# Patient Record
Sex: Female | Born: 1945 | Race: White | Hispanic: No | Marital: Married | Smoking: Never smoker
Health system: Western US, Academic
[De-identification: ages and names within clinical notes are randomized; demographics above are authoritative.]

## PROBLEM LIST (undated history)

## (undated) DIAGNOSIS — I272 Pulmonary hypertension, unspecified: Secondary | ICD-10-CM

## (undated) DIAGNOSIS — E1140 Type 2 diabetes mellitus with diabetic neuropathy, unspecified: Secondary | ICD-10-CM

## (undated) DIAGNOSIS — I10 Essential (primary) hypertension: Secondary | ICD-10-CM

## (undated) DIAGNOSIS — G8929 Other chronic pain: Secondary | ICD-10-CM

## (undated) DIAGNOSIS — I4891 Unspecified atrial fibrillation: Secondary | ICD-10-CM

## (undated) DIAGNOSIS — R0609 Other forms of dyspnea: Secondary | ICD-10-CM

## (undated) DIAGNOSIS — I517 Cardiomegaly: Secondary | ICD-10-CM

## (undated) DIAGNOSIS — Z923 Personal history of irradiation: Secondary | ICD-10-CM

## (undated) DIAGNOSIS — E119 Type 2 diabetes mellitus without complications: Secondary | ICD-10-CM

## (undated) DIAGNOSIS — G2581 Restless legs syndrome: Secondary | ICD-10-CM

## (undated) DIAGNOSIS — R52 Pain, unspecified: Secondary | ICD-10-CM

## (undated) DIAGNOSIS — G4733 Obstructive sleep apnea (adult) (pediatric): Secondary | ICD-10-CM

## (undated) DIAGNOSIS — R0902 Hypoxemia: Secondary | ICD-10-CM

## (undated) DIAGNOSIS — D179 Benign lipomatous neoplasm, unspecified: Secondary | ICD-10-CM

## (undated) DIAGNOSIS — F329 Major depressive disorder, single episode, unspecified: Secondary | ICD-10-CM

## (undated) DIAGNOSIS — A6009 Herpesviral infection of other urogenital tract: Secondary | ICD-10-CM

## (undated) DIAGNOSIS — J45909 Unspecified asthma, uncomplicated: Secondary | ICD-10-CM

## (undated) DIAGNOSIS — G43109 Migraine with aura, not intractable, without status migrainosus: Secondary | ICD-10-CM

## (undated) DIAGNOSIS — E785 Hyperlipidemia, unspecified: Secondary | ICD-10-CM

## (undated) DIAGNOSIS — G4700 Insomnia, unspecified: Secondary | ICD-10-CM

## (undated) DIAGNOSIS — I5032 Chronic diastolic (congestive) heart failure: Secondary | ICD-10-CM

## (undated) DIAGNOSIS — F319 Bipolar disorder, unspecified: Secondary | ICD-10-CM

## (undated) DIAGNOSIS — M797 Fibromyalgia: Secondary | ICD-10-CM

## (undated) DIAGNOSIS — I2510 Atherosclerotic heart disease of native coronary artery without angina pectoris: Secondary | ICD-10-CM

## (undated) DIAGNOSIS — I499 Cardiac arrhythmia, unspecified: Secondary | ICD-10-CM

## (undated) DIAGNOSIS — N289 Disorder of kidney and ureter, unspecified: Secondary | ICD-10-CM

## (undated) DIAGNOSIS — Z7901 Long term (current) use of anticoagulants: Secondary | ICD-10-CM

## (undated) DIAGNOSIS — I509 Heart failure, unspecified: Secondary | ICD-10-CM

## (undated) DIAGNOSIS — M549 Dorsalgia, unspecified: Secondary | ICD-10-CM

## (undated) DIAGNOSIS — K760 Fatty (change of) liver, not elsewhere classified: Secondary | ICD-10-CM

## (undated) DIAGNOSIS — M7989 Other specified soft tissue disorders: Secondary | ICD-10-CM

## (undated) HISTORY — PX: HAMMER TOE SURGERY: SHX385

## (undated) HISTORY — PX: EYE SURGERY: SHX253

## (undated) HISTORY — DX: Essential (primary) hypertension: I10

## (undated) HISTORY — DX: Hypocalcemia: E83.51

## (undated) HISTORY — PX: SHOULDER SURGERY: SHX246

## (undated) HISTORY — DX: Hyperlipidemia, unspecified: E78.5

## (undated) HISTORY — PX: BREAST REDUCTION SURGERY: SHX8

## (undated) HISTORY — DX: Bipolar disorder, unspecified: F31.9

## (undated) HISTORY — DX: Type 2 diabetes mellitus without complications: E11.9

## (undated) HISTORY — DX: Unspecified asthma, uncomplicated: J45.909

## (undated) HISTORY — DX: Major depressive disorder, single episode, unspecified: F32.9

## (undated) HISTORY — DX: Morbid (severe) obesity due to excess calories: E66.01

## (undated) HISTORY — DX: Other forms of dyspnea: R06.09

## (undated) HISTORY — PX: OTHER SURGICAL HISTORY: SHX169

## (undated) HISTORY — DX: Unspecified atrial fibrillation: I48.91

## (undated) HISTORY — PX: COLONOSCOPY: PRO007

## (undated) HISTORY — DX: Type 2 diabetes mellitus without complications (CMS-HCC): E11.9

## (undated) HISTORY — DX: Bipolar disorder, unspecified (CMS-HCC): F31.9

## (undated) HISTORY — DX: Other specified soft tissue disorders: M79.89

## (undated) HISTORY — DX: Other chronic pain: G89.29

---
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
## 1990-06-02 HISTORY — PX: OTHER PROCEDURE: U1053

## 2008-02-28 ENCOUNTER — Institutional Professional Consult (permissible substitution) (HOSPITAL_BASED_OUTPATIENT_CLINIC_OR_DEPARTMENT_OTHER): Admitting: Orthopaedic Surgery

## 2008-02-28 ENCOUNTER — Encounter (HOSPITAL_BASED_OUTPATIENT_CLINIC_OR_DEPARTMENT_OTHER): Payer: Self-pay | Admitting: Orthopaedic Surgery

## 2008-02-28 ENCOUNTER — Other Ambulatory Visit (HOSPITAL_BASED_OUTPATIENT_CLINIC_OR_DEPARTMENT_OTHER): Payer: Self-pay | Admitting: Orthopaedic Surgery

## 2008-02-28 ENCOUNTER — Telehealth (HOSPITAL_BASED_OUTPATIENT_CLINIC_OR_DEPARTMENT_OTHER): Payer: Self-pay

## 2008-02-28 VITALS — BP 120/87 | HR 65 | Temp 98.2°F | Resp 18 | Ht 62.0 in | Wt 190.1 lb

## 2008-02-28 LAB — APTT, BLOOD: PTT: 31.4 s (ref 25.0–34.0)

## 2008-02-28 LAB — COMPREHENSIVE METABOLIC PANEL, BLOOD
ALT (SGPT): 15 U/L
AST (SGOT): 14 U/L
Albumin: 4.7 gm/dL (ref 3.5–5.2)
Alkaline Phos: 69 U/L
BUN: 19 mg/dL (ref 6–20)
Bicarbonate: 25 mmol/L (ref 22–29)
Bilirubin, Tot: 0.4 mg/dL (ref <1.2)
Calcium: 10.6 mg/dL — ABNORMAL HIGH (ref 8.6–10.2)
Chloride: 101 mmol/L (ref 98–107)
Creatinine: 0.82 mg/dL (ref 0.51–0.95)
GFR (African Amer.): >60 mL/min
GFR: >60 mL/min
Glucose: 138 mg/dL — ABNORMAL HIGH (ref ?–115)
Potassium: 4.1 mmol/L (ref 3.5–5.1)
Sodium: 137 mmol/L (ref 136–145)
Total Protein: 8 gm/dL (ref ?–8.0)

## 2008-02-28 LAB — CBC WITH DIFF, BLOOD
Basophils: 2 % (ref 0–2)
Eosinophils: 1 % (ref 1–3)
Hct: 43.8 % (ref 36.0–46.0)
Hgb: 14.7 gm/dL (ref 12.0–16.0)
Lymphocytes: 36 % (ref 20–40)
MCH: 31.6 pg — ABNORMAL HIGH (ref 27–31)
MCHC: 33.5 % (ref 32–37)
MCV: 94.4 um3 (ref 82.0–98.0)
MPV: 10 fl (ref 7.4–10.4)
Monocytes: 8 % (ref 1–10)
Plt Count: 274 10*3/uL (ref 130–400)
RBC: 4.64 10*6/uL (ref 4.00–5.00)
RDW: 14.6 % (ref 10–15)
Segs: 53 % (ref 45–70)
WBC: 8.1 10*3/uL (ref 4.0–11.0)

## 2008-02-28 LAB — ALKALINE PHOSPHATASE, BLOOD: Alkaline Phos: 77 U/L

## 2008-02-28 LAB — PROTHROMBIN TIME, BLOOD
INR: 1
PT,Patient: 11.1 s (ref 9.7–12.5)

## 2008-02-28 LAB — MAGNESIUM, BLOOD: Magnesium: 2.3 mg/dL (ref ?–2.6)

## 2008-02-28 LAB — ABSOLUTE NEUTROPHIL COUNT: Absolute Neutrophil Count: 4.3 10*3/uL

## 2008-02-28 LAB — ADIF: Plt Est: NORMAL

## 2008-02-28 LAB — SED RATE, BLOOD: Sed Rate: 25 mm/hr (ref 0–30)

## 2008-02-28 LAB — LDH, BLOOD: LDH: 189 IU/L — ABNORMAL HIGH (ref 25–175)

## 2008-02-28 LAB — C-REACTIVE PROTEIN, BLOOD: CRP: 0.2 mg/dL (ref <0.5)

## 2008-02-28 LAB — AMYLASE, BLOOD: Amylase: 51 U/L (ref 28–100)

## 2008-02-28 MED ORDER — DIPHENHYDRAMINE HCL 50 MG OR TABS
50.0000 mg | ORAL_TABLET | ORAL | Status: DC
Start: 2008-02-28 — End: 2008-09-11

## 2008-02-28 MED ORDER — PREDNISONE 50 MG OR TABS
50.0000 mg | ORAL_TABLET | ORAL | Status: DC
Start: 2008-02-28 — End: 2008-09-11

## 2008-02-28 NOTE — Progress Notes (Signed)
 Dear Dr. Oneida Arenas,    Thank you for this interesting consultation.    Patient is a 62 year old female with a chief complaint of 45-month history of enlarging left thigh mass. She's noticed that over the past 2 months the mass has increased in size. It is painless. It causes itching over the surface of the skin and she's noticed night sweats for past few months but no fevers. She's lost 35 pounds in the past 4 months due to loss of appetite, and endorses nausea and vomiting. She has no history of radiation or chemical exposures. She saw her primary care physician Dr. Lorenda Peck, who referred her to Dr. Oneida Arenas, who subsequently referred her to Dr. Marden Noble.    Past Medical History: Patient has medical history is pertinent for postraumatic stress disorder, bipolar disorder, migraines, hypertension, hypercholesterolemia, and ulcers.    Past Surgical History: Breast reduction, right bunionectomy, left foot ganglion removal.    Medications: Abilify, Effexor, Inderal, Levoxyl, Zantac, unknown blood pressure medication.    Allergies: No drug allergies.    Social History: Patient is married and has 3 children. She does not work and lives with her husband. She has no smoking history, she is not on a special diet, and she rarely exercises, she has no history of substance abuse. She occasionally drinks alcohol..    Family History: Patient's father died at age 39 from complications associated with Alzheimer's disease. Patient's mother is 1 and healthy. Patient has 2 siblings who are both diabetic and obese.    Review of Systems: Significant significant for congestion in her nose and throat, significant for breathing issues secondary to congestion, sit significant for digestive issues secondary to nausea and vomiting, significant for hypertension, significant for easy bruising, significant for numbness and tingling in both feet, significant for psychological problems.      Well-developed, well-nourished female in no acute  distress.  Vital signs: BP 120/87, HR 65, RR 18, height 5 foot 2 inches, weight 190 pounds  Pain level is 0/10.    Neurologic: no ankle clonus. sensation intact to light touch.  Musculoskeletal: Patient walks with normal gait.  Skin: No rashes, no masses.  Cardiovascular: Heart is regular rate and rhythm, DP nonpalpable, PT 1+.  Psychiatric: mood and affect are appropriate. oriented to person, place, and time.  Respiratory: lungs are clear to auscultation bilaterally. respiratory efforts are unlabored.  Neck: carotids are full without bruits. no thyroid enlargement.  Lymph Nodes: no lymphadenopathy.    Left Lower extremity examination:  Inspection/Palpation: There is a palpable mass in the anterior thigh. The mass is mobile and moves with flexion and extension of the knee. It feels as if it is it attached to the rectus femoris.  ROM: Range of motion of the left knee 0-130.  Stability: knee stable to varus and valgus stress.  Muscle strength: 5 out of 5 quadriceps and hamstrings, EHL, TA, G/S.    Imaging Studies  MRI of the left thigh was reviewed today in clinic. It reveals a heterogeneous mass in the rectus femoris. This can be consistent with lipoma versus leiomyosarcoma.    Assessment: 62 year old female with a 8 month history of left thigh mass, weight loss, and anorexia.    Plan: We'll plan to get a CT of the chest abdomen and pelvis. We'll perform a PET scan. We'll get following labs: INR, PTT, calcium, alkaline phosphatase, LDH, ESR, and CRP. We plan to perform a core biopsy of the mass. We'll see the patient back in clinic once we  have these results.

## 2008-02-28 NOTE — Progress Notes (Signed)
 New Patient  Referred by Dr. Cherilyn Corn ambulatory  into clinic accompanied by husband  For evaluation  Left thigh mass. Urgent Ct guided biopsy left thigh  to be done by DR Jammie Mccune  and urgent full body PET CT scan with contrast-patient is allergic to iodine- MD ordered RX for imaging premeds Prednisone & Benadryl. Patient to have labs drawn today, then to  Return to Clinic for appointment with Dr Genette Kent  in2 weeks. [facility] Phone list and my contact information given to patient. 1 CD from [facility] dated 02-15-2008 with report sent to [place] [facility] to be uploaded

## 2008-02-28 NOTE — Telephone Encounter (Signed)
 TC from patient stating that  She was unable to schedule BX with radiology as she was told "Order not released". I left VM from scheduler N.Sillheet to please fax order to radiology  and contacted patient to try to reschedule tomorrow.

## 2008-03-01 ENCOUNTER — Encounter (HOSPITAL_BASED_OUTPATIENT_CLINIC_OR_DEPARTMENT_OTHER): Payer: Self-pay

## 2008-03-01 NOTE — Progress Notes (Addendum)
 Addended by: HARRIS-NOLAN, Bradden Tadros on: 03/01/2008      Modules accepted: Orders

## 2008-03-02 ENCOUNTER — Telehealth (HOSPITAL_BASED_OUTPATIENT_CLINIC_OR_DEPARTMENT_OTHER): Payer: Self-pay

## 2008-03-02 ENCOUNTER — Other Ambulatory Visit: Payer: Self-pay | Admitting: Diagnostic Radiology

## 2008-03-02 NOTE — Telephone Encounter (Signed)
 TC from patient that  She has been unable to schedule PET scan as she was told that procedure has not been authorized .she called [facility]-received auth # [id number] . I gave this # to [facility] auth dept (Mimi) and called N. Silheet scheduler to please notify patient when she can schedule her Pet scan

## 2008-03-06 ENCOUNTER — Encounter (HOSPITAL_BASED_OUTPATIENT_CLINIC_OR_DEPARTMENT_OTHER): Payer: Self-pay | Admitting: Orthopaedic Surgery

## 2008-03-06 DIAGNOSIS — D219 Benign neoplasm of connective and other soft tissue, unspecified: Secondary | ICD-10-CM

## 2008-03-07 ENCOUNTER — Telehealth (HOSPITAL_BASED_OUTPATIENT_CLINIC_OR_DEPARTMENT_OTHER): Payer: Self-pay

## 2008-03-07 ENCOUNTER — Other Ambulatory Visit (HOSPITAL_BASED_OUTPATIENT_CLINIC_OR_DEPARTMENT_OTHER): Payer: Self-pay | Admitting: Orthopaedic Surgery

## 2008-03-07 NOTE — Telephone Encounter (Signed)
 TC to patient -returning her inquiry about path results-per Dr Genette Kent patient will need surgery to remove Lipoma-DR Kulidjian will review in detail with her on !0-12-09 appt.Patient to have PET /Ct as scheduled on 03-10-08

## 2008-03-13 ENCOUNTER — Ambulatory Visit (HOSPITAL_BASED_OUTPATIENT_CLINIC_OR_DEPARTMENT_OTHER): Admitting: Orthopaedic Surgery

## 2008-03-13 VITALS — BP 120/86 | HR 80 | Temp 98.3°F | Resp 18 | Ht 62.0 in | Wt 198.4 lb

## 2008-03-13 NOTE — Progress Notes (Signed)
[id number]

## 2008-03-13 NOTE — Progress Notes (Signed)
Patient ambulatory into clinic  Accompanied by husband to sign consent for excisional biopsy of left thigh mass-copy of signed consent given to patient and copy sent to medical records

## 2008-03-16 ENCOUNTER — Ambulatory Visit (HOSPITAL_BASED_OUTPATIENT_CLINIC_OR_DEPARTMENT_OTHER): Admitting: Family

## 2008-03-16 ENCOUNTER — Other Ambulatory Visit: Payer: Self-pay

## 2008-03-16 LAB — TYPE & SCREEN
ABO/RH: O POS
Antibody Screen: NEGATIVE

## 2008-03-19 LAB — ECG, COMPLETE (HC/~~LOC~~/ENCINITAS)
ECG INTERPRETATION: NORMAL
PR INTERVAL: 124 ms
QRS INTERVAL/DURATION: 68 ms
QT: 388 ms
QTc (Bazett): 422 ms
R AXIS: -7 degrees
VENTRICULAR RATE: 71 {beats}/min

## 2008-03-20 ENCOUNTER — Encounter (HOSPITAL_BASED_OUTPATIENT_CLINIC_OR_DEPARTMENT_OTHER): Payer: Self-pay | Admitting: Orthopaedic Surgery

## 2008-03-20 NOTE — Progress Notes (Signed)
 CLINIC: [facility]    REPORT TYPE: NOTE    Dictating Practitioner: Kateri Pal, M.D.    DATE OF SERVICE: 03/13/2008    REASON FOR VISIT: FOLLOWUP      HISTORY OF PRESENT ILLNESS: Danielle Reed is returning one week after a  core needle biopsy under ultrasound guidance of the mass in her left thigh.      She said there was significant bruising, but she is not using any pain  medication, and there is no swelling or erythema around the wound.    PHYSICAL EXAMINATION: Indeed, there is about a centimeter and a half round  area of ecchymosis surrounding the biopsy site. No evidence of infection.  No calf tenderness or swelling.    I have reviewed with her the biopsy results which indicated a benign  lipomatous lesion.    IMPRESSION/PLAN: Danielle Reed is a 62 year old female with an  intramuscular lipoma, right thigh. I recommended excision of the mass and  she wished to proceed. We went over the possible complications, including  DVT, PE, MI, CVA, the possibility of death, and including but not limited  to numbness, paresthesias in the area, damage to blood vessels and nerves,  the need for additional procedure, and possible change of diagnosis on the  final pathology results. She understood and decided to proceed. We will  get an anesthesia consultation and schedule her for surgery at the first  opportunity.    I spent 25 minutes with the patient, more than 50% of which was spent  counseling and coordinating care.                        Electronically signed by:  Kateri Pal, M.D. 03/20/2008 12:49 P        DD: 03/13/2008 DT: 03/14/2008 06:11 A DocNo.: [id number]  [id number].DOM      Primary Care Physician:  NO PCP PER PATIENT        cc:

## 2008-03-28 ENCOUNTER — Encounter (HOSPITAL_BASED_OUTPATIENT_CLINIC_OR_DEPARTMENT_OTHER): Payer: Self-pay | Admitting: Orthopaedic Surgery

## 2008-03-28 ENCOUNTER — Other Ambulatory Visit (HOSPITAL_BASED_OUTPATIENT_CLINIC_OR_DEPARTMENT_OTHER): Payer: Self-pay | Admitting: Orthopaedic Surgery

## 2008-03-28 HISTORY — PX: OTHER PROCEDURE: U1053

## 2008-03-29 ENCOUNTER — Telehealth (INDEPENDENT_AMBULATORY_CARE_PROVIDER_SITE_OTHER): Payer: Self-pay | Admitting: Orthopaedic Surgery

## 2008-03-29 NOTE — Telephone Encounter (Signed)
 Patient called regarding: Other: patient had sx 03-28-08 states she is in a lot of pain.  Pain level 8  Is taking her pain medication and is not working. Is requesting to speak with a nurse or Dr Genette Kent.

## 2008-03-29 NOTE — Telephone Encounter (Addendum)
 Pain medication not working  oxycodone 5/325 mg taking medication  2 tabs every 4-6 hrs. I spoke to Dr Marden Noble- advise to take OTC advil 2 tab between dosing of percocet.  Pain should decrease by am.  If this is not the case, to call back and she would change the pain medication.  Notified Ms Reisner of dosing protocol

## 2008-04-03 ENCOUNTER — Ambulatory Visit (HOSPITAL_BASED_OUTPATIENT_CLINIC_OR_DEPARTMENT_OTHER): Admitting: Orthopaedic Surgery

## 2008-04-03 ENCOUNTER — Encounter (HOSPITAL_BASED_OUTPATIENT_CLINIC_OR_DEPARTMENT_OTHER): Payer: Self-pay

## 2008-04-03 VITALS — BP 148/88 | HR 68 | Temp 98.0°F | Resp 18 | Ht 62.0 in | Wt 204.6 lb

## 2008-04-03 NOTE — Progress Notes (Addendum)
 Addended by: Jacquelyne Matte on: 04/03/2008      Modules accepted: Orders

## 2008-04-03 NOTE — Patient Instructions (Addendum)
 MRI left thigh with contrast in one year (may need labs prior) schedule [phone]  Call Cardiology   [phone] to obtain EKG printout

## 2008-04-03 NOTE — Progress Notes (Signed)
 FINAL PATHOLOGIC DIAGNOSIS:  A: Mass of thigh, left, excision  -Intramuscular lipoma.       Patient well. Pain improving. No calf swelling or tenderness.    Unrelated to OR, fell and has foot pain.    Wound healed. Stitches cut at ends. May shower, no restrictions.    Plan:  Foot Xray  Physical therapy  See in 3months.  Repeat MRI in 1 year

## 2008-04-03 NOTE — Progress Notes (Addendum)
 Addended by: HARRIS-NOLAN, Arlean Thies on: 04/03/2008      Modules accepted: Orders

## 2008-04-03 NOTE — Progress Notes (Signed)
Physical therapy order mailed to patient along with google search for facilities in [place]

## 2008-04-03 NOTE — Op Note (Signed)
 Dictating Practitioner: Elvan Hamel, M.D.     Staff Physician: Anna A. Kulidjian, M.D.    Date of Operation: 03/28/2008        PREOPERATIVE DIAGNOSIS: Left thigh mass.  POSTOPERATIVE DIAGNOSIS: Left thigh mass.  PROCEDURE: Total excision of left thigh mass.  SURGEON/STAFF: A. Kulidjian ASST: T. Stephon Weathers    ANESTHESIA: General endotracheal anesthesia.  FINDINGS: Lipomatous mass was seen intramuscularly in the rectus femoris.  SPECIMENS: Mass was sent for pathology.  IV FLUIDS: 1000 mL.  BLOOD PRODUCTS: None.  ESTIMATED BLOOD LOSS: 20 mL.  URINE OUTPUT: Not recorded.  COMPLICATIONS: None.  DRAINS: None.  DISPOSITION: Stable to PACU.    POSTOPERATIVE PLAN: The patient will be weightbearing as tolerated. She  will have no restrictions. She will take Percocet for pain. She will return  to clinic see Dr. Genette Kent in 1 week.    INDICATIONS FOR PROCEDURE: The patient is a 62 year old female who has  noticed several months' history of left thigh mass. Unfortunately, she has  also noted an approximately 30-pound weight loss and anorexia over this  time period as well. A needle biopsy was performed of the left thigh mass  which revealed a benign lipoma. Because of the needle biopsy as well as  imaging studies consistent with lipoma, and PET scan negative, the patient  opted to undergo surgical excision of the mass. Risk, benefits, and  alternatives of the above procedure, including, but not limited to DVT, PE,  MI, stroke, recurrence of mass, and metastatic disease were discussed with  the patient. She opted to undergo these risks and we took her to the OR on  03/28/08.    DESCRIPTION OF PROCEDURE IN DETAIL: Prior to going to the operating room,  the patient had her left lower extremity marked by the surgeon. She had the  lipoma outlined on her skin with a skin marker. She was then taken back to  the operating room where a timeout was held in the normal fashion  identifying the patient and procedure and all  were in agreement. The  patient's left lower extremity then underwent sterile prep and drape in the  normal fashion.    We then used a #10-blade to make an approximately 6-cm longitudinal  incision on the patient's anterior left thigh. The subcutaneous tissues was  incised with Bovie electrocautery and we identified the rectus fascia.  After identifying the rectus fascia, we used electrocautery to make a  longitudinal incision in the rectus fascia and identified a 5 x 3 cm  intermuscular lipoma. The lipomatous mass was excised using electrocautery  medially, laterally, proximally, and distally. Copious hemostasis was  achieved. After removal of the mass, then we copiously irrigated the wound.  We then used #1 Vicryl to repair the rectus fascia in a running stitch. We  then used #1 Vicryl to repair the Scarpa fascia in a continuous stitch. We  then used 2-0 Vicryl for a deep dermal layer, followed by a 3-0 Monocryl  for a running subcuticular stitch. Finally, we placed Steri-Strips, Telfa  and Tegaderm over the wound and sterile dressing. The patient was extubated  and taken to the PACU in stable condition.    The attending surgeon, Dr. Jacquelyne Matte was present and scrubbed for the  entirety of the procedure.      I was present and scrubbed for the entirety of the procedure.                Reviewed & Electronically Signed  by:  Massie Bougie, M.D. 03/28/2008 09:47 A    Edited and Electronically signed by:  Jenness Corner, M.D. 04/03/2008 11:19 A    DD: 03/28/2008 DT: 03/28/2008 09:15 A DocNo.: [id number]  [id number].[facility]      Primary Care Physician:  NO PCP PER PATIENT        cc:

## 2008-04-03 NOTE — Progress Notes (Signed)
 Patient ambulatory into clinic for 1 week post op visit accompanied by husband. Patient is alert & oriented and in some discomfort when ambulating.MD wrote RX for percocet - # 120 -to take one or 2 every 4- 6 hours as needed for pain-.To Return to clinic for appointment with Dr Marden Noble in 3 months. To have xray big toe right side  ASAP To have MRI in 1 year

## 2008-04-14 ENCOUNTER — Telehealth (INDEPENDENT_AMBULATORY_CARE_PROVIDER_SITE_OTHER): Payer: Self-pay | Admitting: Orthopaedic Surgery

## 2008-04-14 NOTE — Telephone Encounter (Signed)
 Called to say Danielle Reed would f/u with her on Monday. She was fine with that.

## 2008-04-14 NOTE — Telephone Encounter (Signed)
 Patient brought in to DR [facility] about 1 month ago paperwork to be completed for her leave of absence and her employer has not received this paperwork,patient would like to know status on this, she is at risk of loosing her job because of this.

## 2008-04-18 ENCOUNTER — Telehealth (HOSPITAL_BASED_OUTPATIENT_CLINIC_OR_DEPARTMENT_OTHER): Payer: Self-pay

## 2008-04-18 NOTE — Telephone Encounter (Signed)
 TC from patient that she is still having pain-Pain around side left knee -  12:00-6:00 position -5 to 6 on pain scale    Taking 2 Percocet every 4-6 hours-she needs a refill of RX -will come to [facility] on  Thurs 11-19 to pick up RX

## 2008-04-18 NOTE — Telephone Encounter (Signed)
 Patient states a letter  ASAP stating that she needs to extend her leave of absence for [employer] or she will be terminated  Letter is to be attn:  [employer]  Employee number [phone]  Start date 02-28-08 through end date 06-01-08    Fax to [phone]

## 2008-04-18 NOTE — Telephone Encounter (Addendum)
 Patient calls back-fax number is incorrect-letter needs to be faxed to [phone]                                         to extend her leave of absence for [employer] or she will be terminated   Letter is to be attn:   [employer]   Employee number [phone]   Start date 02-28-08 through end date 06-01-08

## 2008-04-20 ENCOUNTER — Encounter (INDEPENDENT_AMBULATORY_CARE_PROVIDER_SITE_OTHER): Payer: Self-pay | Admitting: Orthopaedic Surgery

## 2008-05-01 NOTE — Progress Notes (Signed)
 CLINIC: [facility]    REPORT TYPE: LETTER    Dictating Practitioner: Jenness Corner, M.D.    DATE OF SERVICE: 02/28/2008    REASON FOR VISIT: ENLARGING LEFT THIGH MASS          February 28, 2008          Re: Danielle Reed    Dear Dr.    I saw your patient for a consultation with regards to an enlarging left  thigh mass. Please see the detailed note by my resident, Dr. Bonnita Hollow  Neuschwander.    I saw and evaluated the patient with the resident, and formulated the plan  as follows.    PLAN: We will get a CT of the chest, abdomen, and pelvis and perform a PET  scan. We plan for a core needle biopsy of the mass. We will bring the  patient back for clinic as soon as these are available.    Sincerely,        Earlene Plater, M.D.                        Electronically signed by:  Jenness Corner, M.D. 05/01/2008 05:30 P        DD: 03/20/2008 DT: 03/20/2008 05:50 P DocNo.: [id number]  AAK/[id number] [id number].[facility]      Primary Care Physician:  NO PCP PER PATIENT        cc:

## 2008-05-02 ENCOUNTER — Encounter (HOSPITAL_BASED_OUTPATIENT_CLINIC_OR_DEPARTMENT_OTHER): Payer: Self-pay

## 2008-05-02 NOTE — Progress Notes (Signed)
 CD has been uploaded into [facility] -CD returned via mail to patient

## 2008-05-04 ENCOUNTER — Telehealth (INDEPENDENT_AMBULATORY_CARE_PROVIDER_SITE_OTHER): Payer: Self-pay

## 2008-05-04 NOTE — Telephone Encounter (Signed)
 I called Dr Lorenda Peck office at [phone] and confirmed that the patient is a pt of Dr Lorenda Peck got their fax # [phone] and faxed them pt EKG report.Marland Kitchen

## 2008-05-24 ENCOUNTER — Telehealth (HOSPITAL_BASED_OUTPATIENT_CLINIC_OR_DEPARTMENT_OTHER): Payer: Self-pay

## 2008-05-24 NOTE — Telephone Encounter (Signed)
 TC from patient asking to have her leave of absence extended from 12 /31/2009 to end 06/16/2008. "She states she is not progressing as well as she has hoped -is having physical therapy 3 x week ,but therapist has cut back on some of her exercises due to increase in pain-she is taking Motrin 600mg  daily . She works at [employer] and does not think that she can stand on her feet all day yet."   I told patient that MD is out of office until 06-05-08 ,but I will ask for written extension at that time  Appt with Dr Marden Noble scheduled for 06-12-08-patient agreed

## 2008-05-31 ENCOUNTER — Telehealth (HOSPITAL_BASED_OUTPATIENT_CLINIC_OR_DEPARTMENT_OTHER): Payer: Self-pay

## 2008-05-31 NOTE — Telephone Encounter (Signed)
 Pt called to confirm her appt with Dr. Marden Noble on 06/12/08, but wants to make sure that Dr. Marden Noble will sign her leave of absence paperwork and faxed by 06/05/08, when it is due. She would like a confirmation call about this on Monday.

## 2008-06-05 NOTE — Telephone Encounter (Addendum)
 Note signed by MD to extend leave of absence  through 06-16-2008-faxed to  [facility] fax [phone]-original mailed to patient

## 2008-06-06 ENCOUNTER — Telehealth (HOSPITAL_BASED_OUTPATIENT_CLINIC_OR_DEPARTMENT_OTHER): Payer: Self-pay

## 2008-06-06 NOTE — Telephone Encounter (Signed)
 TC to patient to notify her that work absence extension has been faxed to [employer]--patient told me that she a fall on her left knee at physical therapy yesterday-is to have xrays both hips and both knee today -she states that there is question that she has torn ligaments in her operative knee. I asked her to bring copy of xrays on CD to her appt on 06-12-08 -she agrees

## 2008-06-12 ENCOUNTER — Ambulatory Visit (HOSPITAL_BASED_OUTPATIENT_CLINIC_OR_DEPARTMENT_OTHER): Admitting: Orthopaedic Surgery

## 2008-06-12 VITALS — BP 130/78 | HR 78 | Temp 98.1°F | Resp 21 | Ht 62.0 in | Wt 222.4 lb

## 2008-06-12 NOTE — Progress Notes (Addendum)
 Patient ambulatory into clinic for follow up. Patient is alert & oriented and states she is in pain in both knees -especially sore 3-4/10 pain scale in left knee and right hip -she fell onto knees  two days ago .To Return to clinic for appointment with Dr Timoteo Ace in 3 months. 1 CD's Received from [facility] dated 06-06-1008 hip & knees.   CD's  sent  to [facility] to be uploaded into PACS

## 2008-06-12 NOTE — Progress Notes (Addendum)
Addended by: Hale Bogus on: 06/12/2008      Modules accepted: Medications

## 2008-06-12 NOTE — Progress Notes (Signed)
 Pt fell last week when stepping down knee buckled.    Pain now improved. Ambulating. In PT    I reviewed her exercises  - not doing much resistive quads and focusing on hip flexors more and hamstrings.    On exam superficial abrasion on the knee. Knee ROM and hip ROM normal, no tenderness on palpation of lower extremity, NVI, quads 4/5 on the left.    Xray viewed no acute fractures    Plan: given physical therapy instructions. I will see her in 3 months.

## 2008-06-13 ENCOUNTER — Telehealth (INDEPENDENT_AMBULATORY_CARE_PROVIDER_SITE_OTHER): Payer: Self-pay | Admitting: Orthopaedic Surgery

## 2008-06-13 NOTE — Telephone Encounter (Signed)
 [facility] calling. Needs to speak to Dr.Kulidjian in regards to the patients status. Would not give any other information. Please advise.

## 2008-06-13 NOTE — Telephone Encounter (Signed)
 Return call to physical therapy-I was told [facility] not available at present-my contact information left.

## 2008-06-15 NOTE — Telephone Encounter (Signed)
 2nd call to [facility] -Per [facility] "patient told MIchael that Dr Marden Noble is not happy about her progress in physical therapy and wants to speak to Physical therapy". I told him that I did not hear this from Dr Marden Noble, but will pass on message to her. He said that he is pushing patient in therapy primary limitation is cardiovascular restrictions, poor balance & coordination (Patient has fallen 3 times)  and patient perception of "pushing  herself "  with therapy

## 2008-06-20 NOTE — Telephone Encounter (Addendum)
 We appreciate the call back. Pt should work on quadriceps strenght and this can be done sitting or lying down to prevent falls. Prescription to this effect on chart and given to pt. Cardiovascular risk could be minimized by focusing therapy on one muscle group to avoid rapid and prolonged change in heart rate.

## 2008-08-07 ENCOUNTER — Encounter (HOSPITAL_BASED_OUTPATIENT_CLINIC_OR_DEPARTMENT_OTHER): Admitting: Orthopaedic Surgery

## 2008-08-14 ENCOUNTER — Encounter (HOSPITAL_BASED_OUTPATIENT_CLINIC_OR_DEPARTMENT_OTHER): Admitting: Orthopaedic Surgery

## 2008-09-11 ENCOUNTER — Ambulatory Visit (HOSPITAL_BASED_OUTPATIENT_CLINIC_OR_DEPARTMENT_OTHER): Admitting: Orthopaedic Surgery

## 2008-09-11 ENCOUNTER — Encounter (HOSPITAL_BASED_OUTPATIENT_CLINIC_OR_DEPARTMENT_OTHER): Payer: Self-pay | Admitting: Orthopaedic Surgery

## 2008-09-11 VITALS — BP 134/92 | HR 76 | Temp 97.5°F | Resp 20

## 2008-09-11 MED ORDER — METFORMIN HCL 1000 MG OR TABS
1000.00 mg | ORAL_TABLET | Freq: Every day | ORAL | Status: DC
Start: ? — End: 2009-04-02

## 2008-09-11 NOTE — Progress Notes (Addendum)
 Patient into clinic via wheelchair accompanied by husband for follow up.  Patient's right foot is in a cast-she states she fell and broke her  right ankle. She also states she has been recently diagnosed with diabetes-on Metformin. Patient is alert & oriented and in no obvious distress-no psycho/social concerns or needs expressed.  MD ordered MRI with contrast to be done prior to next appointment in one year.Patient expressed understanding of plan of care.

## 2008-09-11 NOTE — Progress Notes (Addendum)
 She had a fracture of the RIGHT ankle not left as in previous notes  by me.

## 2008-09-11 NOTE — Progress Notes (Signed)
 Followup of Medical Oncology:        Demographics:  Date: September 11, 2008   Patient Name: Danielle Reed   Medical Record #: [id number]   DOB: 1946-02-13  Age: 63 year old  Sex: female    Reason for visit: Follow-up      Interval History:    Danielle Reed is 63 year old female  with left thigh intramuscular lipoma. Has fallen recently twisted her left ankle and has a fracture. She was unable to do physical therapy because of this. Also diagnosed with diabetes and started on metformin. Has not noticed new lumps or bumps.    Current Medications:    metformin (GLUCOPHAGE) 1000 MG tablet Take 1,000 mg by mouth daily.   DISCONTD: predniSONE (DELTASONE) 50 MG tablet Take 1 Tab by mouth As Directed. 13 hours, 7 hours and 1 hour prior to CT scan   DISCONTD: diphenhydrAMINE (BENADRYL) 50 MG tablet Take 1 Tab by mouth As Directed. One hour prior to ct scan       Allergies:  Patient is allergic to iodine.    Past Medical History:  Patient has a past medical history of Ankle Fracture and Soft Tissue Tumor, Benign (2009).    Past Surgical History:  The patient's has no past surgical history on file.    Social History:  The patient's history is unchanged as per intake questionnaire      REVIEW OF SYSTEMS   GEN: The patient has no weight loss, fevers, or chills  EYES:No change in vision.  ENT: No change in hearing. No epistaxis.   PULM: No dyspnea, productive cough, or wheezing  CARDIO:  No chest pain, tachycardias, dyspnea on exertion.  GI: No jaundice, hematemesis, abdominal pain, diarrhea or constipation.  GU: No dysuria or hematuria  ENDO: No diabetes.  JOINT:  No new joint pains.  HEME/LYMPHATIC: No bleeding, anemias, or lymphadenopathy.  NEURO: No loss of consciousness, headaches.    Physical Exam:  BP 134/92  Pulse 76  Temp(Src) 97.5 F (36.4 C) (Oral)  Resp 20   General Appearance: The patient is an alert, cooperative female in no acute distress.  SKIN: Wound healed  HEENT: PERRLA, EOMs are intact.  The oropharynx is clear.    EXTREMITIES: There is no edema or cyanosis. Joints are normal without redness or swelling. No local recurrence  NEURO:  Mental status is normal.  No focal neurologic findings.    Assessment/Plan:  In summary this patient is a 63 year old female with intramuscular lipoma with difficult posop rehabilitation course. We advised her to [facility] pool aquafit once able and asked her to continue therapy. Will see her at one year f/u with MRI and PRN afterwards. Should she notice any lumps of bumps we will see her earlier.    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

## 2009-02-14 ENCOUNTER — Telehealth (HOSPITAL_BASED_OUTPATIENT_CLINIC_OR_DEPARTMENT_OTHER): Payer: Self-pay

## 2009-02-14 NOTE — Telephone Encounter (Addendum)
 VM from patient asking for ph number to schedule MRI-and also to say that she has been having some  Sharp pains to the left of her operative site past 3-4 months. VM left for patient to have MRI leg as ordered-scheduling ph number left

## 2009-03-13 ENCOUNTER — Telehealth (HOSPITAL_BASED_OUTPATIENT_CLINIC_OR_DEPARTMENT_OTHER): Payer: Self-pay

## 2009-04-02 ENCOUNTER — Ambulatory Visit (HOSPITAL_BASED_OUTPATIENT_CLINIC_OR_DEPARTMENT_OTHER): Admitting: Orthopaedic Surgery

## 2009-04-02 VITALS — BP 162/113 | HR 88 | Temp 97.5°F | Resp 18 | Ht 62.0 in | Wt 226.0 lb

## 2009-04-02 MED ORDER — METFORMIN HCL 1000 MG OR TABS: 1000.00 mg | ORAL_TABLET | Freq: Two times a day (BID) | ORAL | Status: AC

## 2009-04-02 MED ORDER — TELMISARTAN 80 MG OR TABS
80.00 mg | ORAL_TABLET | Freq: Every day | ORAL | Status: DC
Start: ? — End: 2012-12-28

## 2009-04-11 ENCOUNTER — Ambulatory Visit (INDEPENDENT_AMBULATORY_CARE_PROVIDER_SITE_OTHER): Admitting: Orthopaedic Surgery

## 2009-04-17 ENCOUNTER — Ambulatory Visit (INDEPENDENT_AMBULATORY_CARE_PROVIDER_SITE_OTHER): Admitting: Orthopaedic Surgery

## 2009-05-10 ENCOUNTER — Ambulatory Visit (INDEPENDENT_AMBULATORY_CARE_PROVIDER_SITE_OTHER): Payer: Self-pay | Admitting: Orthopaedic Surgery

## 2009-05-14 ENCOUNTER — Encounter (HOSPITAL_BASED_OUTPATIENT_CLINIC_OR_DEPARTMENT_OTHER): Admitting: Orthopaedic Surgery

## 2009-06-05 ENCOUNTER — Encounter (INDEPENDENT_AMBULATORY_CARE_PROVIDER_SITE_OTHER): Payer: Self-pay | Admitting: Orthopaedic Surgery

## 2009-06-05 ENCOUNTER — Ambulatory Visit (INDEPENDENT_AMBULATORY_CARE_PROVIDER_SITE_OTHER): Admitting: Orthopaedic Surgery

## 2009-06-05 MED ORDER — LIDOCAINE 5 % EX PTCH
1.0000 | MEDICATED_PATCH | CUTANEOUS | Status: AC
Start: 2009-06-05 — End: ?

## 2009-06-05 MED ORDER — VENLAFAXINE HCL 75 MG OR TABS
75.00 mg | ORAL_TABLET | Freq: Every day | ORAL | Status: DC
Start: ? — End: 2012-12-28

## 2009-06-05 MED ORDER — PROPRANOLOL HCL 80 MG OR TABS
80.00 mg | ORAL_TABLET | Freq: Every day | ORAL | Status: DC
Start: ? — End: 2012-12-28

## 2009-06-05 MED ORDER — VENLAFAXINE HCL 150 MG OR CP24
150.00 mg | ORAL_CAPSULE | Freq: Every day | ORAL | Status: DC
Start: ? — End: 2012-12-28

## 2009-06-05 MED ORDER — ARIPIPRAZOLE 5 MG OR TABS: 15.00 mg | ORAL_TABLET | Freq: Every evening | ORAL | Status: AC

## 2009-06-15 ENCOUNTER — Telehealth (INDEPENDENT_AMBULATORY_CARE_PROVIDER_SITE_OTHER): Payer: Self-pay | Admitting: Surgery

## 2009-06-19 ENCOUNTER — Telehealth (INDEPENDENT_AMBULATORY_CARE_PROVIDER_SITE_OTHER): Payer: Self-pay | Admitting: Orthopaedic Surgery

## 2009-06-19 ENCOUNTER — Encounter (INDEPENDENT_AMBULATORY_CARE_PROVIDER_SITE_OTHER): Payer: Self-pay | Admitting: Orthopaedic Surgery

## 2009-07-02 ENCOUNTER — Ambulatory Visit (HOSPITAL_BASED_OUTPATIENT_CLINIC_OR_DEPARTMENT_OTHER): Payer: Self-pay

## 2009-07-03 ENCOUNTER — Telehealth (INDEPENDENT_AMBULATORY_CARE_PROVIDER_SITE_OTHER): Payer: Self-pay | Admitting: Orthopaedic Surgery

## 2009-07-06 ENCOUNTER — Telehealth (INDEPENDENT_AMBULATORY_CARE_PROVIDER_SITE_OTHER): Payer: Self-pay | Admitting: Orthopaedic Surgery

## 2009-07-09 ENCOUNTER — Telehealth (INDEPENDENT_AMBULATORY_CARE_PROVIDER_SITE_OTHER): Payer: Self-pay | Admitting: Orthopaedic Surgery

## 2009-07-09 ENCOUNTER — Other Ambulatory Visit (INDEPENDENT_AMBULATORY_CARE_PROVIDER_SITE_OTHER): Payer: Self-pay | Admitting: Orthopaedic Surgery

## 2009-07-09 MED ORDER — PREDNISONE 1 MG OR TABS
1.0000 mg | ORAL_TABLET | Freq: Every day | ORAL | Status: DC
Start: 2009-07-09 — End: 2012-12-28

## 2009-07-09 MED ORDER — DIPHENHYDRAMINE HCL 25 MG OR CAPS
25.0000 mg | ORAL_CAPSULE | Freq: Four times a day (QID) | ORAL | Status: DC | PRN
Start: 2009-07-09 — End: 2012-12-28

## 2009-07-09 MED ORDER — LORAZEPAM 0.5 MG OR TABS
0.50 mg | ORAL_TABLET | Freq: Four times a day (QID) | ORAL | Status: AC | PRN
Start: 2009-07-09 — End: ?

## 2009-07-16 ENCOUNTER — Encounter (INDEPENDENT_AMBULATORY_CARE_PROVIDER_SITE_OTHER): Admitting: Orthopaedic Surgery

## 2009-08-21 ENCOUNTER — Encounter (INDEPENDENT_AMBULATORY_CARE_PROVIDER_SITE_OTHER): Admitting: Orthopaedic Surgery

## 2009-09-18 ENCOUNTER — Encounter (INDEPENDENT_AMBULATORY_CARE_PROVIDER_SITE_OTHER): Payer: Self-pay | Admitting: Orthopaedic Surgery

## 2010-03-13 NOTE — Lab (Signed)
 FINAL PATHOLOGIC DIAGNOSIS:  A: Soft tissue, left thigh, biopsy   -Intramuscular lipoma, see comment.  COMMENT: This case was reviewed at the Surgical Pathology case review  conference on March 03, 2008 (AS, BD, GL, MP).  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  GROSS DESCRIPTION: A: The specimen (received in formalin, labeled with  the patient's name, medical record number and "Left thigh") consists of  multiple pink-tan soft tissue fragments aggregating to 1.5 x 0.5 x 0.1 cm.  The specimen is entirely submitted in cassette A1.  ES/WR/pd  CONFIDENTIAL HEALTH INFORMATION: Health Care information is personal and  sensitive information. If it is being faxed to you it is done so under  appropriate authorization from the patient or under circumstances that do  not require patient authorization. You, the recipient, are obligated to  maintain it in a safe, secure and confidential manner. Re-disclosure  without additional patient consent or as permitted by law is prohibited.  Unauthorized re-disclosure or failure to maintain confidentiality could  subject you to penalties described in federal and state law. If you have  received this report or facsimile in error, please notify the   Pathology Department immediately and destroy the received document(s).   Material reviewed and Interpreted and  Report Electronically Signed by:  Kingsley Spittle M.D. [phone])  Attending Surgical Pathologist  03/03/08 16:59  Electronic Signature derived from a single  controlled access password

## 2010-03-13 NOTE — Lab (Signed)
 FINAL PATHOLOGIC DIAGNOSIS:  A: Mass of thigh, left, excision   -Intramuscular lipoma.  SPECIMEN(S) SUBMITTED: A: left thigh mass  CLINICAL HISTORY: History of left thigh mass.  GROSS DESCRIPTION: A: The specimen (received in formalin, labeled with the  patient's name, medical record number and "left thigh mass") consists of an  unoriented piece of tan-yellow fibrofatty tissue measuring 5.0 x 4.0 x 0.5  cm and weighing 16.0 g. The outer surface is inked black and sectioning  reveals tan-yellow, hemorrhagic, unremarkable cut surfaces. Representative  sections are submitted in cassettes A1 through A5.  MM/DW/jb  CONFIDENTIAL HEALTH INFORMATION: Health Care information is personal and  sensitive information. If it is being faxed to you it is done so under  appropriate authorization from the patient or under circumstances that do  not require patient authorization. You, the recipient, are obligated to  maintain it in a safe, secure and confidential manner. Re-disclosure  without additional patient consent or as permitted by law is prohibited.  Unauthorized re-disclosure or failure to maintain confidentiality could  subject you to penalties described in federal and state law. If you have  received this report or facsimile in error, please notify the [place]  Pathology Department immediately and destroy the received document(s).   Material reviewed and Interpreted and  Report Electronically Signed by:  Ruffin Frederick. Ponsford Tipps M.D. [phone])  Attending Surgical Pathologist  03/31/08 18:47  Electronic Signature derived from a single  controlled access password

## 2011-06-03 HISTORY — PX: OTHER PROCEDURE: U1053

## 2012-11-29 ENCOUNTER — Telehealth (HOSPITAL_BASED_OUTPATIENT_CLINIC_OR_DEPARTMENT_OTHER): Payer: Self-pay | Admitting: Orthopaedic Surgery

## 2012-11-29 NOTE — Telephone Encounter (Signed)
Will discuss sooner appt with provider. 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  Last 2010

## 2012-11-29 NOTE — Telephone Encounter (Signed)
Offered appt for today, pt unable to make appt. She is seeing PCP and will request MRI and keep appt on 07/21 and bring in MRI report.  Discussed with PA  Matter resolved

## 2012-11-29 NOTE — Telephone Encounter (Signed)
Patient has an appointment with Dr Mckinley Jewel on July 28th but would like to get in sooner. She is stating that her tumor is getting bigger daily and is very concerned.

## 2012-11-30 ENCOUNTER — Telehealth (INDEPENDENT_AMBULATORY_CARE_PROVIDER_SITE_OTHER): Payer: Self-pay | Admitting: Orthopaedic Surgery

## 2012-11-30 DIAGNOSIS — M7989 Other specified soft tissue disorders: Secondary | ICD-10-CM

## 2012-11-30 HISTORY — DX: Other specified soft tissue disorders: M79.89

## 2012-11-30 NOTE — Telephone Encounter (Signed)
Pt is calling stating that she would like to know what type of tumor she has so that her PCP can order an appropriate MRI for her prior to her appt with Dr. Marden Noble on 12/01/12.  Please contact pt to advise.  She is also requesting [place] to contact her PCP (Dr. Pincus Sanes) at [phone] (phone) or [phone] (fax).

## 2012-12-01 ENCOUNTER — Encounter (INDEPENDENT_AMBULATORY_CARE_PROVIDER_SITE_OTHER): Payer: Self-pay | Admitting: Physician Assistant

## 2012-12-01 NOTE — Telephone Encounter (Signed)
Called back patient regarding MRI request from Dr. [name] Cables office at [facility]. Need order to be placed or be sent to primary, Dr. Eldridge Scot office to be ordered and then be evaluated for same symptoms last seen 04/02/2013. Appointment with Dr. Marden Noble 12/20/2012.  Routed to [place], [place] for review.      Last seen 04/02/2009    Assessment/Plan:   In summary this patient is a 67 year old female with intramuscular lipoma. She has no evidence of local recurrence. She will follow with PCP and return if any new lumps of bumps appear of if there is any concerns.

## 2012-12-02 ENCOUNTER — Telehealth (INDEPENDENT_AMBULATORY_CARE_PROVIDER_SITE_OTHER): Payer: Self-pay | Admitting: Orthopaedic Surgery

## 2012-12-02 NOTE — Telephone Encounter (Signed)
Called patient and left message that per [place] everything in our system states her left extremity, so per [place] patient should be evaluated on 7/21 appt and Dr Marden Noble will decide which extremity.

## 2012-12-02 NOTE — Telephone Encounter (Signed)
Called pt back, MRI order recv'd, pt should schedule with Imaging.  Pt given contact info to schedule  Matter resolved

## 2012-12-02 NOTE — Telephone Encounter (Signed)
Patient states she attempted to schedule her MRI but was told the MRI order we submitted was for her left thigh. Per patient, the tumor is in her Right Lower leg. Patient would like the order updated and a call to confirm so she can schedule.

## 2012-12-06 ENCOUNTER — Telehealth (INDEPENDENT_AMBULATORY_CARE_PROVIDER_SITE_OTHER): Payer: Self-pay | Admitting: Orthopaedic Surgery

## 2012-12-06 NOTE — Telephone Encounter (Signed)
New order placed, advised pt needs to have done prior to appt on 12/20/12

## 2012-12-06 NOTE — Telephone Encounter (Signed)
Patient is returning a call from [place] regarding MRI order (see 12/02/12 encounter).     Patient states she saw Dr. Marden Noble in 2010 for a Left thigh lipoma but she is now requesting an MRI and appointment for her right lower leg. Per patient she has a lump/tumor in the right lower leg that has been growing and her PCP agrees it should be evaluated by Dr. Marden Noble.     Patient received the message that she should keep 7/21 appointment at which time MRI would be ordered. Patient would like to discuss further if that is appropriate or if she should have an MRI before.

## 2012-12-08 HISTORY — PX: OTHER PROCEDURE: U1053

## 2012-12-14 ENCOUNTER — Ambulatory Visit (INDEPENDENT_AMBULATORY_CARE_PROVIDER_SITE_OTHER): Payer: Medicare Other

## 2012-12-20 ENCOUNTER — Ambulatory Visit (INDEPENDENT_AMBULATORY_CARE_PROVIDER_SITE_OTHER): Payer: Medicare Other | Admitting: Orthopaedic Surgery

## 2012-12-20 DIAGNOSIS — D492 Neoplasm of unspecified behavior of bone, soft tissue, and skin: Secondary | ICD-10-CM

## 2012-12-20 NOTE — Progress Notes (Signed)
ORTHOPAEDIC CONSULTATION  [facility]PART[facility] OF ORTHOPAEDIC SURGERY    Demographics:  Date: December 20, 2012    Patient Name: Danielle Reed   Medical Record #: [id number]   DOB: 05-Dec-1945  Age: 67 year old  Sex: female    Consultation requesting physician:  Osvaldo Human    Primary Care Physician:  Osvaldo Human    Reason for visit: Right leg soft tissue mass    Translator needed:  no    History of present Illness:   Danielle Reed is a 67 year old female with a history of left thigh lipoma who comes in today for evaluation of several new soft tissue masses in her bilateral legs that appeared 6 months ago.  She states she has several in the right inner calf, but one in the left medial leg.  She denies any trauma to that area.  She has had some skin changes at the soft tissue mass on the medial mid leg where the skin has become slightly darker and dry.  She thinks they have grown in size over the past few months and they have become painful.    Pain:  location: medial leg bilaterally    Previous Treatment:  Left thigh soft tissue mass resection    Review of Systems:  Constitutional: No weight loss, fevers or chills.  HEENT: No headaches. No vision change. No hearing change. No epistaxis, throat clear.  Psychiatric: No mood change  Neurologic: No loss of consciousness  Cardiovascular: No chest pain, tachycardia, dyspnea on exertion  Respiratory: No shortness of breath, no cough.  GI: No jaundice, hematemesis, abdominal pain, diarrhea or constipation  GU: No dysuria or hematuria  MSK: As per HPI  Endocrine: No abnormalities  Heme/Lymphatic: No bleeding, anemia or lymphadenopathy  Allergy/Immunology: No new findings, no infections    Current Medications:    aripiprazole (ABILIFY) 5 MG tablet Take 5 mg by mouth 3 times daily. Per patients history   diphenhydrAMINE (BENADRYL) 25 MG capsule Take 1 Cap by mouth every 6 hours as needed for Itching.   lidocaine (LIDODERM) 5 % patch Apply 1 Patch topically every 24 hours. Apply one patch to  affected area for up to 12 hours in a 24 hour period.   LORazepam (ATIVAN) 0.5 MG tablet Take 1 Tab by mouth every 6 hours as needed for Anxiety. Place sublingually 20 minutes prior to procedure   metformin (GLUCOPHAGE) 1000 MG tablet Take 1,000 mg by mouth 2 times daily.   predniSONE (DELTASONE) 1 MG tablet Take 1 Tab by mouth daily.   propranolol (INDERAL) 80 MG tablet Take 80 mg by mouth daily. Per patients history   telmisartan (MICARDIS) 80 MG tablet Take 80 mg by mouth daily.   venlafaxine (EFFEXOR XR) 150 MG XR capsule Take 150 mg by mouth daily. Per patient she takes a 75mg  and 150 mg daily   venlafaxine (EFFEXOR) 75 MG tablet Take 75 mg by mouth daily. Per patient history she takes a 75mg  and 150 mg daily       Patient Allergies:  Allergies   Allergen Reactions   . Iodine Swelling     Throat clsoes up , eyes swell , toung swells        Past Medical History:  Past Medical History   Diagnosis Date   . Ankle fracture    . Soft tissue tumor, benign 2009        Past Surgical History:  Past Surgical History   Procedure Laterality Date   .  Pb exc tumor soft tissue leg/ankle subfasc 5+cm  oct,2009        Social History:  History     Social History   . Marital Status: Married     Spouse Name: N/A     Number of Children: N/A   . Years of Education: N/A     Occupational History   . Not on file.     Social History Main Topics   . Smoking status: Never Smoker    . Smokeless tobacco: Not on file   . Alcohol Use: No   . Drug Use: Not on file   . Sexually Active:      Other Topics Concern   . Not on file     Social History Narrative   . No narrative on file        Family History:  No family history on file.    Physical Exam:  There were no vitals taken for this visit.     General Appearance:   Healthy, alert, no distress, pleasant cooperative.  Psychiatric:  Mood and affect appropriate.  HEENT: Cranial nerves intact, oropharynx clear  Neck: Supple, no adenopathy, thyroid symmetric normal size.  Heart: Normal rate and  regular rhythm, no murmurs clicks or gallops  Lungs: Clear to auscultation and percussion.  Abdomen: Soft, non-tender. No masses, no organomegaly.  Extremities: No cyanosis, clubbing or edema.  Skin: No rashes or lesions.  There is darkening and dryness of the skin at the medial soft tissue mass of her right mid-leg.  Neuro: No gross sensory and motor abnormalities.  Vascular: Strong distal pulses.  Lymph: No lymphadenopathy  MSK:  Right medial leg has 5 small (~1cm) soft tissue masses between her proximal and mid leg.  The masses feels slightly firm and are tender.  There is one ~1cm soft tissue mass on the junction of the middle and distal third of her left medial leg.  She has good motion of her bilateral knees and ankles.  She ambulates with a normal gait.    Laboratory and Radiology:  I viewed the radiographic studies today.  MRI right leg w/wo contrast from 12/14/12 show  Several nodular areas of signal change are identified in the subcutaneous fat   of the medial aspect of the calf, in the region of the placed skin marker,   demonstrating a T1 hypointense / fluid sensitive hyperintensity rim   pseudocapsule, with obscuration of the fat centrally. They demonstrate   minimal, predominantly peripheral enhancement. The largest nodular area   measures 2 cm in diameter. The overlying skin is normal in   thickness/appearance.    Visualized muscle bulk is normal. Bone marrow signal is normal. Neurovascular   bundles appear intact.    Assesment:   Danielle Reed has:  Multiple lower leg soft tissue mass    Plan:  We discussed the MRI with her.  The soft tissue masses do not have the appearance of lipomas on the MRI.  We discussed obtaining an excisional biopsy of one of the soft tissue masses proximally.  If it is benign, then we will observe the other soft tissue masses.    We discussed the risks and benefits of surgery.  Risks include, but are not limited to bleeding, infection, blood clot, risks of anesthesia.  She  understands and agrees to proceed.  She has signed her consent today.    We also discussed using 1000mg  of Ester C daily to help with soft tissue healing.  The patient was seen and evaluated by Dr. Marden Noble.  The plan was formulated by Dr. Marden Noble and presented to the patient by her as well.      CC:   Yam, Ving

## 2012-12-23 NOTE — Progress Notes (Signed)
AAK:I saw and evaluated the patient.  My interview and exam confirm the key elements reported and I agree with the plan as written. Please see the attached note for details.    Lab No orders of the defined types were placed in this encounter.     Imaging No orders of the defined types were placed in this encounter.     Procedures No orders of the defined types were placed in this encounter.     Other No orders of the defined types were placed in this encounter.       Plan:    ICD-9-CM   1. Neoplasm of unspecified nature of bone, soft tissue, and skin 239.2         Thank you very much for this interesting consultation.  Please do not hesitate to contact  me if there are any questions or concerns.  I can be reached directly by email or my phone.    Best regards,    Osby Sweetin A. Dragan Tamburrino, MD, MSc, FRCSC  [email or web address]  [phone]

## 2012-12-27 ENCOUNTER — Ambulatory Visit (HOSPITAL_BASED_OUTPATIENT_CLINIC_OR_DEPARTMENT_OTHER): Payer: Medicare Other | Admitting: Orthopaedic Surgery

## 2012-12-28 ENCOUNTER — Ambulatory Visit (INDEPENDENT_AMBULATORY_CARE_PROVIDER_SITE_OTHER): Payer: Medicare Other

## 2012-12-28 ENCOUNTER — Other Ambulatory Visit (INDEPENDENT_AMBULATORY_CARE_PROVIDER_SITE_OTHER): Payer: Medicare Other

## 2012-12-28 ENCOUNTER — Encounter (INDEPENDENT_AMBULATORY_CARE_PROVIDER_SITE_OTHER): Payer: Self-pay

## 2012-12-28 VITALS — BP 151/89 | HR 70 | Temp 98.4°F | Resp 20 | Ht 62.0 in | Wt 190.7 lb

## 2012-12-28 DIAGNOSIS — Z01818 Encounter for other preprocedural examination: Secondary | ICD-10-CM | POA: Insufficient documentation

## 2012-12-28 MED ORDER — CARISOPRODOL 350 MG OR TABS: 350.00 mg | ORAL_TABLET | Freq: Three times a day (TID) | ORAL | Status: AC | PRN

## 2012-12-28 MED ORDER — PREGABALIN 50 MG OR CAPS: 50.00 mg | ORAL_CAPSULE | Freq: Three times a day (TID) | ORAL | Status: AC

## 2012-12-28 MED ORDER — AMLODIPINE 5 MG OR TABS: 5.00 mg | ORAL_TABLET | Freq: Every morning | ORAL | Status: AC

## 2012-12-28 MED ORDER — ZOLPIDEM TARTRATE 10 MG OR TABS: 10.00 mg | ORAL_TABLET | Freq: Every evening | ORAL | Status: AC | PRN

## 2012-12-28 MED ORDER — IBUPROFEN 800 MG OR TABS: 800.00 mg | ORAL_TABLET | Freq: Three times a day (TID) | ORAL | Status: AC | PRN

## 2012-12-28 MED ORDER — CHLORTHALIDONE 25 MG OR TABS: 25.00 mg | ORAL_TABLET | Freq: Every morning | ORAL | Status: AC

## 2012-12-28 MED ORDER — BUTALBITAL-ACETAMINOPHEN 50-325 MG OR TABS: 2.00 | ORAL_TABLET | ORAL | Status: AC | PRN

## 2012-12-28 MED ORDER — HYDROCODONE-ACETAMINOPHEN 10-325 MG OR TABS: 1.00 | ORAL_TABLET | Freq: Four times a day (QID) | ORAL | Status: AC | PRN

## 2012-12-28 MED ORDER — ATORVASTATIN CALCIUM 40 MG OR TABS: 40.00 mg | ORAL_TABLET | Freq: Every evening | ORAL | Status: AC

## 2012-12-28 MED ORDER — SUMATRIPTAN SUCCINATE 100 MG OR TABS: 100.00 mg | ORAL_TABLET | Freq: Once | ORAL | Status: AC | PRN

## 2012-12-28 NOTE — Anesthesia Preprocedure Evaluation (Addendum)
ANESTHESIA PRE-OPERATIVE EVALUATION    Patient Information  Name: Danielle Reed    MRN: [id number]    DOB: May 12, 1946    Age: 67 year old    Sex: female  Primary language spoken:  English    Procedure(s):  EXCISION RT LEG MASS      C/C: 67 y/o F with Right leg ST mass is scheduled for excision Right leg mass with Dr. Sima Matas on DOS: 01/05/2013.    ROS/Medical History:  General Review & History of Anesthetic Complications:   Patient summary reviewed   No history of anesthetic complications, No PONV, No history of difficult intubation and no family history of anesthetic complications      Cardiovascular:    Exercise tolerance: (Exercise: Walks. Can walk 2 blocks and up 1 flight of stairs. Limited due to some SOB but able to stop brieftly and walk again. Denies chest pain sxs.)  (+)  hypercholesterolemia  hypertension well controlled        (-) CAD, angina, DOE    ECG reviewed   Pulmonary:  - negative ROS   (-) sleep apnea Hematology/Oncology:   - negative ROS   Neuro/Psych:        (+) , psychiatric history,     Infectious Disease:   Negative ROS         Endo/Other:      (+) diabetes mellitus type 2 well controlled, back pain    ROS Comments: FBS: ranges ~90-120. Off Lantus and Humolog insulin mid June 2014 per pt report. Continues on Metformin BID.  Reports her A1C was ~6.2 ~one month ago (?)    + chronic Right SI joint pain and also Left knee pain.    Recent Left hammertoe sx: boot off: sl swelling: doing well. GI/Hepatic:     (+) GERD well controlled,     ROS Comments: + hx of GERD: off PPI and reportedly stalble. (was on Prilosec).   Renal:    - Negative ROS     No Electrolyte acid base abnormality and IV Contrast in last 24-48 hours   Pregnancy History:  Para: 3  Gravida: 3  Currently pregnant? no       [facility]) Notes:  beta blocker therapy  [facility] Test & records reviewed by [facility] provider  [facility] Echo/ECG/Angio test (EKG: 12/28/12: NSR, 64 BPM, nl EKG.) available.  [facility] [place] labs available.    Patient  preoperative instructions:  1. Nothing to eat or drink after midnight the night prior to surgery.  However, advised to only take the following medications the morning of surgery with sips of water: Amlodipine, Chlorthalidone, Lyrica and may take hydrocodone as needed.   Pt to call us with her other BP medication: we will let her know if she can take it on the day of surgery.  Advised to HOLD Metformin after the morning dose the day before surgery.  2. Advised to hold any aspirin, ibuprofen, aleve, naprosyn, Mobic, and other nonsteroidal anti-inflammatory drugs, Celebrex, fish oil, Co-Q 10, Glucosamine/Chondroitin/MSM, vitamin E, other vitamins and herbal supplements for 7 days prior to surgery.  3. Prior to surgery, advised to NOT HOLD NORMAL PRESCRIPTION MEDICATIONS unless advised otherwise.   4. Advised may take acetaminophen, (Tylenol) or other prescription pain meds not containing aspirin/ibuprofen prior to surgery if needed.  5. Labs: chem, cbc, PT, T&S.:  :      Patient preoperative instructions:  1. Nothing to eat or drink after midnight the night prior to surgery.  However, advised to only take the following medications the morning of surgery with sips of water: Amlodipine, Chlorthalidone, Lyrica and may take hydrocodone as needed.   Pt to call us with her other BP medication: we will let her know if she can take it on the day of surgery.  Advised to HOLD Metformin after the morning dose the day before surgery.  2. Advised to hold any aspirin, ibuprofen, aleve, naprosyn, Mobic, and other nonsteroidal anti-inflammatory drugs, Celebrex, fish oil, Co-Q 10, Glucosamine/Chondroitin/MSM, vitamin E, other vitamins and herbal supplements for 7 days prior to surgery.  3. Prior to surgery, advised to NOT HOLD NORMAL PRESCRIPTION MEDICATIONS unless advised otherwise.   4. Advised may take acetaminophen, (Tylenol) or other prescription pain meds not containing aspirin/ibuprofen prior to surgery if needed.  5. Labs:  chem, cbc, PT, T&S.:      Vital signs:  BP 151/89  Pulse 70  Temp(Src) 98.4 F (36.9 C) (Oral)  Resp 20  Ht 5\' 2"  (1.575 m)  Wt 86.5 kg (190 lb 11.2 oz)  BMI 34.87 kg/m2  SpO2 100%   General: NAD, alert and oriented to TPP and situation, PERRL, speech clear. Here with her husband.  Psych: Normal affect; no evidence of physical, emotional, or financial abuse.    Physical Exam:  Airway:  Inter-inciser distance 3-4 cm  Prognanth Able    Mallampati: II  Neck ROM: full  TM distance: 4-5 cm  Short thick neck: No     Cardiovascular:    PE comment: EKG: 12/28/12: NSR, 64 BPM, nl EKG.- cardiovascular exam normal  Rhythm: regular  Rate: normal   Dental:    Comment: + missing teeth: upper R and L as per diagram in EMR.       Pulmonary:  pulmonary exam normal   breath sounds clear to auscultation     Neuro/Neck/Skeletal/Skin:  Right medial leg: + 5 small (~1cm) soft tissue firm and sl TTP masses between her proximal and mid leg.     Left medial leg:There is one ~1cm soft tissue mass on the junction of the middle and distal third of her Left medial leg.    FROM bilateral knees and ankles.  Ambulates with a normal gait.  Neuro/vasc/sensory WNL.  (full exam deferred to surgeon,.)  - Skin abnormalities       Abdominal:  + obese abd    Abdomen: soft.  Bowel sounds: normal.  General: obesity             Has a physician diagnosed you with sleep agnea?: No  Do you use a CPAP at home?: No  OSA total score (A score of 2 or more is high risk. Offer patient sleep study.): 1    Past Medical History   Diagnosis Date   . Right Ankle fracture 2010     casted   . 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   . Soft tissue mass, Right leg 11/2012   . Diabetes mellitus, type 2      on Metformin. Off Lantus and Humolog aftr ~20 pound wt loss: mid June 2014.   Marland Kitchen HTN (hypertension)    . Hyperlipidemia    . Bipolar disorder      anxiety/depression: stable on  Abilify   . Chronic pain      Right SI joint pain, Left knee pain.     Past Surgical History   Procedure  Laterality Date   . Status post excision of left thigh mass  03/28/2008     excision of left thigh mass 03/28/2008: FINAL PATHOLOGIC DX: Intramuscular lipoma.   . Bilateral breast reduction  1992   . S/p right hammer toe surgical correction  06/2011   . S/p left hammer toe surgical correction  12/08/2012   . Colonoscopy       History   Substance Use Topics   . Smoking status: Never Smoker    . Smokeless tobacco: Never Used   . Alcohol Use: 1.0 oz/week     2 drink(s) per week       Current Outpatient Prescriptions   Medication Sig Dispense Refill   . ACETAMINOPHEN-BUTALBITAL 50-325 MG TABS Take 2 tablets by mouth as needed. Uses PRN migraines (currently ~3 times weekly as of 11/2012)       . amLODIPINE (NORVASC) 5 MG tablet Take 5 mg by mouth every morning.       Marland Kitchen aripiprazole (ABILIFY) 5 MG tablet Take 15 mg by mouth every evening. Per patients history       . atorvastatin (LIPITOR) 40 MG tablet Take 40 mg by mouth every evening.       . carisoprodol (SOMA) 350 MG tablet Take 350 mg by mouth every 8 hours as needed for Muscle Spasms.       . chlorthalidone (HYGROTEN) 25 MG tablet Take 25 mg by mouth every morning.       Marland Kitchen HYDROcodone-acetaminophen (NORCO) 10-325 MG per tablet Take 1 tablet by mouth every 6 hours as needed for Moderate Pain (Pain Score 4-6).       Marland Kitchen ibuprofen (MOTRIN) 800 MG tablet Take 800 mg by mouth every 8 hours as needed for Mild Pain (Pain Score 1-3).       Marland Kitchen lidocaine (LIDODERM) 5 % patch Apply 1 Patch topically every 24 hours. Apply one patch to affected area for up to 12 hours in a 24 hour period.  30 Patch  2   . LORazepam (ATIVAN) 0.5 MG tablet Take 1 Tab by mouth every 6 hours as needed for Anxiety. Place sublingually 20 minutes prior to procedure  2 Tab  1   . metformin (GLUCOPHAGE) 1000 MG tablet Take 1,000 mg by mouth 2 times daily.       . pregabalin (LYRICA) 50 MG capsule Take 50 mg  by mouth 3 times daily.       . sumatriptan (IMITREX) 100 MG tablet Take 100 mg by mouth once as needed for Migraine. May repeat in 2 hours in needed       . zolpidem (AMBIEN) 10 MG tablet Take 10 mg by mouth nightly as needed for Insomnia.         No current facility-administered medications for this visit.     Allergies   Allergen Reactions   . Iodine Swelling     Throat clsoes up , eyes swell , tongue swells       Labs and Other Data  Lab Results   Component Value Date    NA 141 12/28/2012    K 4.3 12/28/2012    CL 105 12/28/2012    BICARB 22 12/28/2012    BUN 20 12/28/2012    CREAT 0.67 12/28/2012    GLU 132 12/28/2012    [place] 10.6 12/28/2012     Lab Results   Component Value Date    AST 25 12/28/2012    ALT 24 12/28/2012    LDH  189 02/28/2008    ALK 95 12/28/2012    TP 7.9 12/28/2012    ALB 4.7 12/28/2012    TBILI 0.2 12/28/2012     Lab Results   Component Value Date    WBC 11.7 12/28/2012    RBC 4.73 12/28/2012    HGB 13.4 12/28/2012    HCT 40.0 12/28/2012    MCV 84.6 12/28/2012    MCHC 33.5 12/28/2012    RDW 13.7 12/28/2012    PLT 361 12/28/2012    PLT 274 02/28/2008    MPV 11.6 12/28/2012    SEG 60 12/28/2012    LYMPHS 32 12/28/2012    MONOS 6 12/28/2012    EOS 2 12/28/2012    BASOS 2 02/28/2008     Lab Results   Component Value Date    INR 1.0 12/28/2012    PTT 31.4 02/28/2008     No results found for this basename: ARTPH,  ARTPO2,  ARTPCO2     Imaging:   RLE MRI: 12/14/2012:  FINDINGS:  Several nodular areas of signal change are identified in the subcutaneous fat of the medial aspect of the calf, in the region of the placed skin marker, demonstrating a T1 hypointense / fluid sensitive hyperintensity rim pseudocapsule, with obscuration of the fat centrally. They demonstrate minimal, predominantly peripheral enhancement. The largest nodular area measures 2 cm in diameter. The overlying skin is normal in thickness/appearance.  Visualized muscle bulk is normal. Bone marrow signal is normal. Neurovascular bundles appear  intact.  IMPRESSION:  Findings, as described above, most in keeping with multiple areas of fat necrosis in the medial subcutaneous fat of the leg. Please correlate with clinical history of trauma or injections at this site. If clinical concern persists, short-term interval followup may be of value to evaluate for evolution.    Anesthesia Plan:  ASA 2 (Mild systemic disease)     Planned induction method: general   Planned monitoring method: Routine monitoring  Comments: (Discussed local/MAC vs. GA.  Pt elects to have GA.  Took Coreg this am.)   Dental risks explained & discussed  Anesthetic plan and risks discussed with Patient.         Plan discussed with Attending and Surgeon.

## 2012-12-28 NOTE — H&P (Signed)
PREOPERATIVE HISTORY AND PHYSICAL AND ANESTHESIA PRE-OPERATIVE EVALUATION    Patient Information  Name: Danielle Reed    MRN: [id number]    DOB: 12-06-1945    Age: 67 year old    Sex: female  Primary language spoken:  English    Procedure(s):  EXCISION RT LEG MASS      C/C: 67 y/o F with Right leg ST mass is scheduled for excision Right leg mass with Dr. Sima Matas on DOS: 01/05/2013.    HPI: (as per EMR):  67 year old female with a history of left thigh lipoma c/o several new soft tissue masses in her bilateral legs that appeared ~6-7 months ago. She states she has several in the right inner calf, but one in the left medial leg. She denies any trauma to that area. She has had some skin changes at the soft tissue mass on the medial mid leg where the skin has become slightly darker and dry. She thinks they have grown in size over the past few months and they have become painful.   Pain: location: medial leg bilaterally   Previous Treatment: Left thigh soft tissue mass resection (path= lipoma)    ROS/Medical History:  General Review & History of Anesthetic Complications:   Patient summary reviewed   No history of anesthetic complications, No PONV, No history of difficult intubation and no family history of anesthetic complications      Cardiovascular:  Exercise tolerance: (Exercise: Walks. Can walk 2 blocks and up 1 flight of stairs. Limited due to some SOB but able to stop brieftly and walk again. Denies chest pain sxs.)  (+)  hypercholesterolemia  hypertension well controlled    (-) CAD, angina, DOE    ECG reviewed   Pulmonary:  - negative ROS   (-) sleep apnea Hematology/Oncology:   - negative ROS   Neuro/Psych:   (+) , psychiatric history   Infectious Disease:   Negative ROS     Endo/Other:    (+) diabetes mellitus type 2 well controlled, back pain    ROS Comments: FBS: ranges ~90-120. Off Lantus and Humolog insulin mid June 2014 per pt report. Continues on Metformin BID.  Reports her A1C was ~6.2 ~one month ago  (?)    + chronic Right SI joint pain and also Left knee pain.    Recent Left hammertoe sx: boot off: sl swelling: doing well. GI/Hepatic:   (+) GERD well controlled     ROS Comments: + hx of GERD: off PPI and reportedly stalble. (was on Prilosec).   Renal:    - Negative ROS     No Electrolyte acid base abnormality and IV Contrast in last 24-48 hours   Pregnancy History:  Para: 3  Gravida: 3  Currently pregnant? no       Vital signs:  BP 151/89  Pulse 70  Temp(Src) 98.4 F (36.9 C) (Oral)  Resp 20  Ht 5\' 2"  (1.575 [place])  Wt 86.5 kg (190 lb 11.2 oz)  BMI 34.87 kg/m2  SpO2 100%   General: NAD, alert and oriented to TPP and situation, PERRL, speech clear. Here with her husband.  Psych: Normal affect; no evidence of physical, emotional, or financial abuse.    Physical Exam:  Airway:  Inter-inciser distance 3-4 cm  Prognanth Able    Mallampati: II  Neck ROM: full  TM distance: 4-5 cm  Short thick neck: No     Cardiovascular:  PE comment:   EKG: 12/28/12: NSR, 64 BPM, nl  EKG.- cardiovascular exam normal  Rhythm: regular  Rate: normal   Dental:  Comment: + missing teeth: upper R and L as per diagram in EMR.       Pulmonary:  pulmonary exam normal   breath sounds clear to auscultation     Neuro/Neck/Skeletal/Skin:  Right medial leg: + 5 small (~1cm) soft tissue firm and sl TTP masses between her proximal and mid leg.     Left medial leg:There is one ~1cm soft tissue mass on the junction of the middle and distal third of her Left medial leg.    FROM bilateral knees and ankles.  Ambulates with a normal gait.  Neuro/vasc/sensory WNL.  (full exam deferred to surgeon,.)  - Skin abnormalities       Abdominal:  + obese abd  Abdomen: soft, obese.  Bowel sounds: normal.  General: obesity         Has a physician diagnosed you with sleep agnea?: No  Do you use a CPAP at home?: No  OSA total score (A score of 2 or more is high risk. Offer patient sleep study.): 1    Past Medical History   Diagnosis Date   . Right Ankle fracture 2010      casted   . 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   . Soft tissue mass, Right leg 11/2012   . Diabetes mellitus, type 2      on Metformin. Off Lantus and Humolog aftr ~20 pound wt loss: mid June 2014.   Marland Kitchen HTN (hypertension)    . Hyperlipidemia    . Bipolar disorder      anxiety/depression: stable on Abilify   . Chronic pain      Right SI joint pain, Left knee pain.     Past Surgical History   Procedure Laterality Date   . Status post excision of left thigh mass  03/28/2008     excision of left thigh mass 03/28/2008: FINAL PATHOLOGIC DX: Intramuscular lipoma.   . Bilateral breast reduction  1992   . S/p right hammer toe surgical correction  06/2011   . S/p left hammer toe surgical correction  12/08/2012   . Colonoscopy       History   Substance Use Topics   . Smoking status: Never Smoker    . Smokeless tobacco: Never Used   . Alcohol Use: 1.0 oz/week     2 drink(s) per week       Current Outpatient Prescriptions   Medication Sig Dispense Refill   . ACETAMINOPHEN-BUTALBITAL 50-325 MG TABS Take 2 tablets by mouth as needed. Uses PRN migraines (currently ~3 times weekly as of 11/2012)       . amLODIPINE (NORVASC) 5 MG tablet Take 5 mg by mouth every morning.       Marland Kitchen aripiprazole (ABILIFY) 5 MG tablet Take 15 mg by mouth every evening. Per patients history       . atorvastatin (LIPITOR) 40 MG tablet Take 40 mg by mouth every evening.       . carisoprodol (SOMA) 350 MG tablet Take 350 mg by mouth every 8 hours as needed for Muscle Spasms.       . chlorthalidone (HYGROTEN) 25 MG tablet Take 25 mg by mouth every morning.       Marland Kitchen HYDROcodone-acetaminophen (NORCO) 10-325 MG per tablet Take 1 tablet by mouth every 6 hours as needed for Moderate Pain (  Pain Score 4-6).       Marland Kitchen ibuprofen (MOTRIN) 800 MG tablet Take 800 mg by mouth every 8 hours as needed for Mild Pain (Pain Score 1-3).       Marland Kitchen lidocaine (LIDODERM) 5 % patch Apply 1 Patch  topically every 24 hours. Apply one patch to affected area for up to 12 hours in a 24 hour period.  30 Patch  2   . LORazepam (ATIVAN) 0.5 MG tablet Take 1 Tab by mouth every 6 hours as needed for Anxiety. Place sublingually 20 minutes prior to procedure  2 Tab  1   . metformin (GLUCOPHAGE) 1000 MG tablet Take 1,000 mg by mouth 2 times daily.       . pregabalin (LYRICA) 50 MG capsule Take 50 mg by mouth 3 times daily.       . sumatriptan (IMITREX) 100 MG tablet Take 100 mg by mouth once as needed for Migraine. May repeat in 2 hours in needed       . zolpidem (AMBIEN) 10 MG tablet Take 10 mg by mouth nightly as needed for Insomnia.         No current facility-administered medications for this visit.     Allergies   Allergen Reactions   . Iodine Swelling     Throat clsoes up , eyes swell , tongue swells       Labs and Other Data  Lab Results   Component Value Date    NA 141 12/28/2012    K 4.3 12/28/2012    CL 105 12/28/2012    BICARB 22 12/28/2012    BUN 20 12/28/2012    CREAT 0.67 12/28/2012    GLU 132 12/28/2012    [place] 10.6 12/28/2012     Lab Results   Component Value Date    AST 25 12/28/2012    ALT 24 12/28/2012    LDH 189 02/28/2008    ALK 95 12/28/2012    TP 7.9 12/28/2012    ALB 4.7 12/28/2012    TBILI 0.2 12/28/2012     Lab Results   Component Value Date    WBC 11.7 12/28/2012    RBC 4.73 12/28/2012    HGB 13.4 12/28/2012    HCT 40.0 12/28/2012    MCV 84.6 12/28/2012    MCHC 33.5 12/28/2012    RDW 13.7 12/28/2012    PLT 361 12/28/2012    PLT 274 02/28/2008    MPV 11.6 12/28/2012    SEG 60 12/28/2012    LYMPHS 32 12/28/2012    MONOS 6 12/28/2012    EOS 2 12/28/2012    BASOS 2 02/28/2008     Lab Results   Component Value Date    INR 1.0 12/28/2012    PTT 31.4 02/28/2008     No results found for this basename: ARTPH,  ARTPO2,  ARTPCO2     Imaging:   RLE MRI: 12/14/2012:  FINDINGS:  Several nodular areas of signal change are identified in the subcutaneous fat of the medial aspect of the calf, in the region of the placed skin marker, demonstrating  a T1 hypointense / fluid sensitive hyperintensity rim pseudocapsule, with obscuration of the fat centrally. They demonstrate minimal, predominantly peripheral enhancement. The largest nodular area measures 2 cm in diameter. The overlying skin is normal in thickness/appearance.  Visualized muscle bulk is normal. Bone marrow signal is normal. Neurovascular bundles appear intact.  IMPRESSION:  Findings, as described above, most in keeping with multiple areas of fat  necrosis in the medial subcutaneous fat of the leg. Please correlate with clinical history of trauma or injections at this site. If clinical concern persists, short-term interval followup may be of value to evaluate for evolution.    Anesthesia Plan:  ASA 2 (Mild systemic disease)   Planned induction method: general   Planned monitoring method: Routine monitoring  Comments: (Discussed general anesthesia with pt today.  Anesthesia induction method(s) to be discussed with patient and confirmed by attending anesthesiologist on the day of surgery.)     [facility]) Notes:  beta blocker therapy  [facility] Test & records reviewed by [facility] provider  [facility] ECG (EKG: 12/28/12: NSR, 64 BPM, nl EKG.) available.    [facility] labs available.      Patient preoperative instructions:  1. Nothing to eat or drink after midnight the night prior to surgery.  However, advised to only take the following medications the morning of surgery with sips of water: Amlodipine, Chlorthalidone, Lyrica and may take hydrocodone as needed.   Pt to call us with her other BP medication: we will let her know if she can take it on the day of surgery.  Advised to HOLD Metformin after the morning dose the day before surgery.  2. Advised to hold any aspirin, ibuprofen, aleve, naprosyn, Mobic, and other nonsteroidal anti-inflammatory drugs, Celebrex, fish oil, Co-Q 10, Glucosamine/Chondroitin/MSM, vitamin E, other vitamins and herbal supplements for 7 days prior to surgery.  3. Prior to surgery,  advised to NOT HOLD NORMAL PRESCRIPTION MEDICATIONS unless advised otherwise.   4. Advised may take acetaminophen, (Tylenol) or other prescription pain meds not containing aspirin/ibuprofen prior to surgery if needed.  5. Labs: chem, cbc, PT, T&S.

## 2012-12-28 NOTE — Patient Instructions (Addendum)
Patient preoperative instructions:  1. Nothing to eat or drink after midnight the night prior to surgery.  However, please only take the following medications the morning of surgery with sips of water: Amlodipine, Chlorthalidone, Lyrica and may take hydrocodone as needed.   Please HOLD Metformin after the morning dose the day before surgery.    Please call your other BP medication to [place]: we will let you know if you can take it on the day of surgery. Santina Evans: [phone])    2. Please hold any aspirin, ibuprofen, aleve, naprosyn, Mobic, and other nonsteroidal anti-inflammatory drugs, Celebrex, fish oil, Co-Q 10, Glucosamine/Chondroitin/MSM, vitamin E, other vitamins and herbal supplements for 7 days prior to surgery.  3. Prior to surgery, DO NOT HOLD YOUR NORMAL PRESCRIPTION MEDICATIONS unless advised otherwise.   4. You may take acetaminophen, (Tylenol) or other prescription pain meds not containing aspirin/ibuprofen prior to surgery if needed.  5. Please go to the [facility] lab located on the 3rd floor today: Room 327.     (Note: the [facility] lab closes at 4:30 pm).  (Option is to get your labs done either at the [employer] [facility] or [facility] lab.)

## 2012-12-29 MED ORDER — CARVEDILOL 6.25 MG OR TABS: 6.25 mg | ORAL_TABLET | Freq: Two times a day (BID) | ORAL | Status: AC

## 2012-12-29 NOTE — Progress Notes (Signed)
12/29/12: f/u preop labs from 12/28/12:    Labs are essentially WNL except as follows:  WBC: sl high at 11.7  Abs lymphs sl high at 3.7  GLU: sl elevated at 132  GlycoHg: elevated: 6.9 (Pt has DM type 2 on meds.)    C/c'd to surgeon to manage prn.    Frances Furbish, NP   [facility] preop anesthesia

## 2012-12-31 LAB — ECG 12-LEAD
QRS INTERVAL/DURATION: 72 ms
VENTRICULAR RATE: 64 {beats}/min

## 2013-01-05 ENCOUNTER — Encounter (HOSPITAL_COMMUNITY): Admission: RE | Disposition: A | Discharge status: Home Health | Payer: Self-pay | Attending: Orthopaedic Surgery

## 2013-01-05 ENCOUNTER — Ambulatory Visit
Admission: RE | Admit: 2013-01-05 | Discharge: 2013-01-05 | Disposition: A | Discharge status: Home / Self Care | Payer: Medicare Other | Source: Ambulatory Visit | Attending: Orthopaedic Surgery | Admitting: Orthopaedic Surgery

## 2013-01-05 ENCOUNTER — Encounter (HOSPITAL_COMMUNITY): Payer: Self-pay

## 2013-01-05 ENCOUNTER — Ambulatory Visit (HOSPITAL_COMMUNITY): Payer: Medicare Other | Admitting: Anesthesiology

## 2013-01-05 ENCOUNTER — Ambulatory Visit (HOSPITAL_COMMUNITY): Payer: Medicare Other | Admitting: Orthopaedic Surgery

## 2013-01-05 DIAGNOSIS — F3130 Bipolar disorder, current episode depressed, mild or moderate severity, unspecified: Secondary | ICD-10-CM | POA: Insufficient documentation

## 2013-01-05 DIAGNOSIS — M799 Soft tissue disorder, unspecified: Secondary | ICD-10-CM | POA: Insufficient documentation

## 2013-01-05 DIAGNOSIS — E785 Hyperlipidemia, unspecified: Secondary | ICD-10-CM | POA: Insufficient documentation

## 2013-01-05 DIAGNOSIS — E119 Type 2 diabetes mellitus without complications: Secondary | ICD-10-CM | POA: Insufficient documentation

## 2013-01-05 DIAGNOSIS — D1739 Benign lipomatous neoplasm of skin and subcutaneous tissue of other sites: Secondary | ICD-10-CM

## 2013-01-05 DIAGNOSIS — F411 Generalized anxiety disorder: Secondary | ICD-10-CM | POA: Insufficient documentation

## 2013-01-05 DIAGNOSIS — G8929 Other chronic pain: Secondary | ICD-10-CM | POA: Insufficient documentation

## 2013-01-05 DIAGNOSIS — I10 Essential (primary) hypertension: Secondary | ICD-10-CM | POA: Insufficient documentation

## 2013-01-05 DIAGNOSIS — E7889 Other lipoprotein metabolism disorders: Secondary | ICD-10-CM | POA: Insufficient documentation

## 2013-01-05 LAB — GLUCOSE (POCT)
Glucose (POCT): 169 mg/dL — ABNORMAL HIGH (ref 70–115)
Glucose (POCT): 136 mg/dL — ABNORMAL HIGH (ref 70–115)

## 2013-01-05 SURGERY — EXCISION, LESION, LOWER EXTREMITY
Anesthesia: General | Site: Leg Lower | Laterality: Right

## 2013-01-05 MED ORDER — LIDOCAINE HCL (CARDIAC) 20 MG/ML IV SOLN
INTRAVENOUS | Status: DC | PRN
Start: 2013-01-05 — End: 2013-01-05
  Administered 2013-01-05: 80 mg via INTRAVENOUS

## 2013-01-05 MED ORDER — CEFAZOLIN SODIUM 1 GM IJ SOLR
INTRAMUSCULAR | Status: DC | PRN
Start: 2013-01-05 — End: 2013-01-05
  Administered 2013-01-05: 1000 mg via INTRAVENOUS

## 2013-01-05 MED ORDER — INSULIN REGULAR HUMAN 100 UNIT/ML IJ SOLN
2.0000 [IU] | Freq: Once | INTRAMUSCULAR | Status: DC | PRN
Start: 2013-01-05 — End: 2013-01-05

## 2013-01-05 MED ORDER — LIDOCAINE-EPINEPHRINE 1 %-1:100000 IJ SOLN
INTRAMUSCULAR | Status: DC | PRN
Start: 2013-01-05 — End: 2013-01-05
  Administered 2013-01-05: 3 mL via SUBCUTANEOUS

## 2013-01-05 MED ORDER — DEXTROSE 50 % IV SOLN
25.0000 mL | INTRAVENOUS | Status: DC | PRN
Start: 2013-01-05 — End: 2013-01-05

## 2013-01-05 MED ORDER — FENTANYL CITRATE 0.05 MG/ML IJ SOLN
INTRAMUSCULAR | Status: DC | PRN
Start: 2013-01-05 — End: 2013-01-05
  Administered 2013-01-05: 50 ug via INTRAVENOUS
  Administered 2013-01-05 (×2): 25 ug via INTRAVENOUS

## 2013-01-05 MED ORDER — OXYCODONE-ACETAMINOPHEN 5-325 MG OR TABS
2.0000 | ORAL_TABLET | ORAL | Status: DC | PRN
Start: 2013-01-05 — End: 2013-01-13

## 2013-01-05 MED ORDER — PROPOFOL IV BOLUS 10 MG/ML
INTRAVENOUS | Status: DC | PRN
Start: 2013-01-05 — End: 2013-01-05
  Administered 2013-01-05: 150 mg via INTRAVENOUS

## 2013-01-05 MED ORDER — DIPHENHYDRAMINE HCL 50 MG/ML IJ SOLN
12.5000 mg | Freq: Once | INTRAMUSCULAR | Status: DC | PRN
Start: 2013-01-05 — End: 2013-01-05

## 2013-01-05 MED ORDER — EPHEDRINE SULFATE 50 MG/ML IJ SOLN
INTRAMUSCULAR | Status: DC | PRN
Start: 2013-01-05 — End: 2013-01-05
  Administered 2013-01-05 (×2): 5 mg via INTRAVENOUS

## 2013-01-05 MED ORDER — LACTATED RINGERS IV SOLN
INTRAVENOUS | Status: DC | PRN
Start: 2013-01-05 — End: 2013-01-05

## 2013-01-05 MED ORDER — FENTANYL CITRATE 0.05 MG/ML IJ SOLN
50.0000 ug | INTRAMUSCULAR | Status: DC | PRN
Start: 2013-01-05 — End: 2013-01-05

## 2013-01-05 MED ORDER — MEPERIDINE HCL 25 MG/ML IJ SOLN
12.5000 mg | INTRAMUSCULAR | Status: DC | PRN
Start: 2013-01-05 — End: 2013-01-05

## 2013-01-05 MED ORDER — HYDROMORPHONE HCL 1 MG/ML IJ SOLN
0.5000 mg | INTRAMUSCULAR | Status: DC | PRN
Start: 2013-01-05 — End: 2013-01-05

## 2013-01-05 MED ORDER — NALOXONE HCL 0.4 MG/ML IJ SOLN
0.1000 mg | INTRAMUSCULAR | Status: DC | PRN
Start: 2013-01-05 — End: 2013-01-05

## 2013-01-05 MED ORDER — ONDANSETRON HCL 4 MG/2ML IV SOLN
4.0000 mg | Freq: Once | INTRAMUSCULAR | Status: DC | PRN
Start: 2013-01-05 — End: 2013-01-05

## 2013-01-05 MED ORDER — MIDAZOLAM HCL 2 MG/2ML IJ SOLN
INTRAMUSCULAR | Status: DC | PRN
Start: 2013-01-05 — End: 2013-01-05
  Administered 2013-01-05: 2 mg via INTRAVENOUS

## 2013-01-05 MED ORDER — FENTANYL CITRATE 0.05 MG/ML IJ SOLN
25.0000 ug | INTRAMUSCULAR | Status: DC | PRN
Start: 2013-01-05 — End: 2013-01-05

## 2013-01-05 SURGICAL SUPPLY — 41 items
BANDAGE COBAN 6 STERILE LATEX FREE (Misc Medical Supply) IMPLANT
BANDAGE ELASTIC W/ VELCRO 6 X 5 YDS, STERILE (Dressings/packing) IMPLANT
BANDAGE, GAUZE ROLL, KERLIX 4.5" X 4.1YD (Dressings/packing) IMPLANT
CONTAINER PREFIL FORMALIN 60ML (Misc Medical Supply)
COVER LIGHT HANDLE (Drape/Gowns/Gloves/Pack) ×4
DRAPE HALF SHEET MEDIUM (Drape/Gowns/Gloves/Pack) ×2 IMPLANT
DRAPE ORTHO BAR SHEET 100 X 60" (Drapes/towels)
DRAPE ORTHO U 76" X 120" (Drape/Gowns/Gloves/Pack) ×2 IMPLANT
DRAPE U IMPERVIOUS SPLIT FILM, 54" X 76" (Drape/Gowns/Gloves/Pack) ×2 IMPLANT
DRESSING ADAPTIC 3X8 STERILE (Dressings/packing)
DRESSING MEPILEX LITE 2.4 X 3.4 (Dressings/packing) ×2 IMPLANT
DRESSING SPONGE CURITY 4X4 16 PLY (Dressings/packing) IMPLANT
GLOVE BIOGEL PI ULTRATOUCH SIZE 7.5 (Drape/Gowns/Gloves/Pack) ×16
GOWN MICRO COOL LG BLUE, AAMI LVL 4 (Drape/Gowns/Gloves/Pack)
GOWN SIRUS XLG BLUE, AAMI LVL 4 (Drape/Gowns/Gloves/Pack) ×4 IMPLANT
NEEDLE PROTECT 22G X 1.5" (Needles/punch/cannula/biopsy) ×2
PAD GROUND VALLEYLAB REM ADULT E7507 (Misc Medical Supply) ×2
PREP DURAPREP SURGICAL SOLUTION 26 ML (Misc Surgical Supply) ×2 IMPLANT
PROTECTOR ULNAR NERVE PAD, YELLOW (Misc Medical Supply)
RETRACTOR LONESTAR RETRACTOR RINGS 28.3 X 18.3CM W/ CATH CLIPS (Misc Medical Supply) IMPLANT
SEQUENTIAL COMPRESSION STOCKINGS REGULAR (Misc Medical Supply) ×2 IMPLANT
SOLUTION IRR POUR BTL 0.9% NS 1000ML (Non-Pharmacy Meds/Solutions) ×2
SOLUTION IRR POUR BTL H20 1000ML (Non-Pharmacy Meds/Solutions) ×2 IMPLANT
SPONGE LAP RF DETECT 18" X 18" XRAY STERILE (Drape/Gowns/Gloves/Pack) ×4
STAPLER PROXIMATE SKIN 35 WIDE (Suture) ×2 IMPLANT
STAY ELASTIC LONESTAR 5 MM SHARP HOOK (Misc Medical Supply)
STOCKINETTE 9X49 IMPRVS MED (Misc Medical Supply)
STRIP SKIN CLOSURE 1/2X4 (Dressings/packing) ×2 IMPLANT
SURGICAL PACK BASIC MAJOR SET-UP (Procedure Packs/kits) ×2 IMPLANT
SUTURE MONOCRYL 4-0 27" PS-2 (Suture) ×2 IMPLANT
SUTURE VICRYL 2-0 CP-2 (Suture)
SUTURE VICRYL 4-0 18 PS-2 (Suture)
SUTURE VICRYL PLUS 0 18" CT-1 CR VCP840D (Suture)
SUTURE VICRYL PLUS 1-0 18" CT-1 POPS (Suture)
SUTURE VICRYL PLUS 4-0 18" PS-2 VCP496 (Suture) ×2
SWAB CULTURE W/AMIES LIQUID (Misc Medical Supply) IMPLANT
SYRINGE 10CC LL CONTRL (Needles/punch/cannula/biopsy) IMPLANT
TOURNIQUET CUFF PURPLE DISPOSABLE 34" X 4" (Misc Medical Supply) ×2 IMPLANT
TOWEL BLUE STERILE DISPOSABLE (Drape/Gowns/Gloves/Pack) ×4
TUBE YANKAUER FLEXIBLE TIP (Drains/Catheter/Tubes/Reservoir)
WET SKIN SCRUB PREP TRAY (Misc Medical Supply) IMPLANT

## 2013-01-05 NOTE — Interdisciplinary (Signed)
Family made aware of patient's arrival to the recovery room. Update given. Informed that nursing will call in 45 minutes-1 hour for visitation in the recovery room.

## 2013-01-05 NOTE — Op Note (Signed)
Dictating Practitioner: Rona Ravens, M.D.     Staff Physician:  Rona Ravens, M.D.    Date of Operation:  01/05/2013        PREOPERATIVE DIAGNOSES: Soft tissue mass 5 cm subcutaneous right shin.    POSTOPERATIVE DIAGNOSES: Soft tissue mass 5 cm subcutaneous right shin.    PROCEDURE PERFORMED: Excision mass subcutaneous 5 cm, right shin.    SURGEON/STAFF:      Earlene Plater, M.D.    CLINICAL HISTORY: Ms. Danielle Reed presented with an enlarging mass in her  right shin which slowly, progressively grew, especially over the last few  months and at times was tender. Imaging did not identify any malignant  appearing lesion. She was offered an excision for the purposes of  characterization and diagnosis. Of note, there was another mass more distal  that palpated to be about 3 cm and was not changing in size. She elected to  leave this for a later date or to see if any association can be drawn after  the first biopsy.    In the preoperative area, she understood the possibility of complications,  including but not limited to DVT, PE, MI, CVA, possibility of death,  infection, neurovascular damage and she decided to proceed.    DESCRIPTION OF PROCEDURE: She was brought into the operating room. General  anesthesia was administered. Right leg was prepped and draped in the usual  sterile fashion. After a timeout, I made a longitudinal incision on the  skin, identified a small venule that was tracking to an area of  yellowish-appearing 3 or 4 masses clustered together that felt as one, and  it felt, at least on palpation, that these masses had calcified. We removed  the masses using blunt and sharp dissection and had to cauterize the venule  proximal and distal as it was tracking through it and appeared to be part  of the mass. Each individual mass as I was passing them off was separating  into individually separated lobules, and it felt like calcified fat. My  differential diagnosis would include fat  necrosis, but we will await final  pathology.    After thorough irrigation, I closed the wound with 2-0 Vicryl and Monocryl  with Steri-Strips.    Postoperatively, no restrictions. Weightbearing as tolerated.    The patient awakened with no complications. We injected 5 mL of 0.25%  bupivacaine.                          Electronically signed by:  Rona Ravens, M.D. 01/10/2013 03:38 P    DD: 01/05/2013    DT: 01/05/2013 09:10 A   DocNo.: [id number]  [id number]           [id number].[facility]    Referring Physician:  Marden Noble          cc:

## 2013-01-05 NOTE — Anesthesia Postprocedure Evaluation (Signed)
Anesthesia Transfer of Care Note    Patient: Danielle Reed    Procedures performed: Procedure(s):  EXCISION RIGHT LEG MASS    Vital signs: stable           Anesthesia Post Note    Patient: Danielle Reed    Procedure(s) Performed: Procedure(s):  EXCISION RIGHT LEG MASS      Final anesthesia type: general    Patient location: PACU    Post anesthesia pain: adequate analgesia    Post assessment: no apparent anesthetic complications, tolerated procedure well and no evidence of recall    Mental status: awake, alert  and oriented    Last Vitals:   Filed Vitals:    01/05/13 0910   BP: 105/56   Pulse: 63   Temp: 36.1 C   Resp: 13   SpO2: 100%       Post vital signs: stable    Hydration: adequate    N/V:no    Anesthetic complications: no    Plan of care per primary team.

## 2013-01-05 NOTE — Discharge Instructions (Signed)
Discharge Instructions:  1. Keep wound clean and dry for as long as possible  2. If dirty,you can change outer dressing and clean surrounding skin; leave steri strip until they fall off on their own  3. Isolate the area when taking a shower so as not to bring "bugs" (bacteria) other portions of the body (in particular the groin) to the wound.  4. If pain or swelling occurs, elevate the extremity and apply cool compress  5. Take Tylenol as directed for pain relief; you can also take prescribed pain medication for severe pain (if needed)  6. Return to clinic in 10 days to discuss pathology report  7. If there is any acute change in pain, concern for infection or change in symptoms please contact the office at [phone] or go immediately to the nearest emergency department.

## 2013-01-05 NOTE — H&P (Signed)
HISTORY & PHYSICAL - INTERVAL ASSESSMENT    **ONLY TO BE USED IN ADDITION TO A HISTORY & PHYSICAL**    Danielle Reed  [id number]      This interval assessment is required for History & Physical completed less than 30 days but more than 24 hours prior to the admission or surgery. A History & Physical completed more than 30 days prior to the admission or surgery must be repeated.    Current Medical Status:  Unchanged    Medications / Allergies:  Unchanged    Review of Systems:  Unchanged    Physical Examination:  Unchanged    Laboratory or Clinical Data:  Unchanged    Modifications of Initial Care Plan:  Unchanged        Trula Ore J Vitug     01/05/2013     8:09 AM

## 2013-01-12 NOTE — Procedures (Signed)
 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       -See comment.  COMMENT:  Special stains for fungus (GMS) and mycobacteria (AFB, Fite) are  negative.  Special stains and/or procedures were used in the final interpretation.  All stains were performed with appropriate positive and negative controls.    SPECIMEN(S) SUBMITTED:  A: right lower leg mass    CLINICAL HISTORY:  Right leg mass.    GROSS DESCRIPTION:  A: The specimen (received without fixative and subsequently fixed in  formalin at 1030 on 01/05/2013 by SD, labeled with the patient's name,  medical record number and "right lower leg mass") consists of multiple  fragments of tan-yellow lobulated to tan-white and firm soft tissue  aggregating to 4.0 x 3.5 x 0.5 cm. Due to the fragmented nature of the  specimen, the margins cannot be determined. Sectioning reveals tan-yellow  lobulated and tan-white firm cut surfaces. No hemorrhage is grossly  identified. Representative sections are submitted in cassettes A1 to A2.  EB/JS/jb  CONFIDENTIAL HEALTH INFORMATION: Health Care information is personal and  sensitive information. If it is being faxed to you it is done so under  appropriate authorization from the patient or under circumstances that do  not require patient authorization. You, the recipient, are obligated to  maintain it in a safe, secure and confidential manner. Re-disclosure  without additional patient consent or as permitted by law is prohibited.  Unauthorized re-disclosure or failure to maintain confidentiality could  subject you to penalties described in federal and state law.  If you have  received this report or facsimile in error, please notify the [facility] immediately and destroy the received document(s).    Material reviewed and Interpreted and  Report Electronically Signed by:  Berkley Breech [place] M.D., Ph.D.  [phone])  01/12/13 10:36  Electronic Signature derived from a single  controlled access password

## 2013-01-13 ENCOUNTER — Ambulatory Visit (INDEPENDENT_AMBULATORY_CARE_PROVIDER_SITE_OTHER): Payer: Medicare Other | Admitting: Orthopaedic Surgery

## 2013-01-13 DIAGNOSIS — M7989 Other specified soft tissue disorders: Secondary | ICD-10-CM

## 2013-01-13 DIAGNOSIS — Z09 Encounter for follow-up examination after completed treatment for conditions other than malignant neoplasm: Secondary | ICD-10-CM

## 2013-01-13 MED ORDER — OXYCODONE-ACETAMINOPHEN 5-325 MG OR TABS
1.0000 | ORAL_TABLET | Freq: Four times a day (QID) | ORAL | Status: AC | PRN
Start: 2013-01-13 — End: ?

## 2013-01-13 NOTE — Progress Notes (Signed)
 This is the first postoperative visit for this patient.    Patient is doing well. Denies SOB, CP, wound problems. Pain has been well controlled.    On examination today:  Well appearing. Wound: no erythema, healing well. Calf supple and non-tender.    Pathology:  A: Soft tissue, right lower leg mass, excision   -Fibroadipose tissue with non-caseating granulomatous inflammation and fat   necrosis.      Plan:    She will follow up with us  in 3-6 months. She showed me a new lesion today which is a small lump in upper extremity is 1 cm x 1 cm. This has not been growing. Will followup with this her she will return sooner if she has noticed any change. I suspect based on examination this is an epidermal inclusion cyst.  Will see her in one week for staple removal.

## 2013-06-07 DIAGNOSIS — M25559 Pain in unspecified hip: Secondary | ICD-10-CM | POA: Diagnosis not present

## 2013-06-09 DIAGNOSIS — M47817 Spondylosis without myelopathy or radiculopathy, lumbosacral region: Secondary | ICD-10-CM | POA: Diagnosis not present

## 2013-06-22 DIAGNOSIS — M76899 Other specified enthesopathies of unspecified lower limb, excluding foot: Secondary | ICD-10-CM | POA: Diagnosis not present

## 2013-06-27 DIAGNOSIS — C169 Malignant neoplasm of stomach, unspecified: Secondary | ICD-10-CM | POA: Diagnosis not present

## 2013-06-27 DIAGNOSIS — D334 Benign neoplasm of spinal cord: Secondary | ICD-10-CM | POA: Diagnosis not present

## 2013-06-27 DIAGNOSIS — E669 Obesity, unspecified: Secondary | ICD-10-CM | POA: Diagnosis not present

## 2013-06-28 DIAGNOSIS — Q6530 Congenital partial dislocation of unspecified hip, unilateral: Secondary | ICD-10-CM | POA: Diagnosis not present

## 2013-06-28 DIAGNOSIS — Q6500 Congenital dislocation of unspecified hip, unilateral: Secondary | ICD-10-CM | POA: Diagnosis not present

## 2013-07-01 DIAGNOSIS — Q6500 Congenital dislocation of unspecified hip, unilateral: Secondary | ICD-10-CM | POA: Diagnosis not present

## 2013-07-01 DIAGNOSIS — Q6530 Congenital partial dislocation of unspecified hip, unilateral: Secondary | ICD-10-CM | POA: Diagnosis not present

## 2013-07-05 DIAGNOSIS — Q6500 Congenital dislocation of unspecified hip, unilateral: Secondary | ICD-10-CM | POA: Diagnosis not present

## 2013-07-05 DIAGNOSIS — Q6530 Congenital partial dislocation of unspecified hip, unilateral: Secondary | ICD-10-CM | POA: Diagnosis not present

## 2013-07-07 ENCOUNTER — Encounter (INDEPENDENT_AMBULATORY_CARE_PROVIDER_SITE_OTHER): Payer: Medicare Other | Admitting: Orthopaedic Surgery

## 2013-07-07 DIAGNOSIS — Q6500 Congenital dislocation of unspecified hip, unilateral: Secondary | ICD-10-CM | POA: Diagnosis not present

## 2013-07-07 DIAGNOSIS — Q6530 Congenital partial dislocation of unspecified hip, unilateral: Secondary | ICD-10-CM | POA: Diagnosis not present

## 2013-07-10 ENCOUNTER — Encounter (INDEPENDENT_AMBULATORY_CARE_PROVIDER_SITE_OTHER): Payer: Self-pay

## 2013-07-12 DIAGNOSIS — Q6500 Congenital dislocation of unspecified hip, unilateral: Secondary | ICD-10-CM | POA: Diagnosis not present

## 2013-07-12 DIAGNOSIS — Q6530 Congenital partial dislocation of unspecified hip, unilateral: Secondary | ICD-10-CM | POA: Diagnosis not present

## 2013-07-14 DIAGNOSIS — Q6500 Congenital dislocation of unspecified hip, unilateral: Secondary | ICD-10-CM | POA: Diagnosis not present

## 2013-07-14 DIAGNOSIS — L600 Ingrowing nail: Secondary | ICD-10-CM | POA: Diagnosis not present

## 2013-07-14 DIAGNOSIS — Q6530 Congenital partial dislocation of unspecified hip, unilateral: Secondary | ICD-10-CM | POA: Diagnosis not present

## 2013-07-14 DIAGNOSIS — M722 Plantar fascial fibromatosis: Secondary | ICD-10-CM | POA: Diagnosis not present

## 2013-08-02 DIAGNOSIS — Q6500 Congenital dislocation of unspecified hip, unilateral: Secondary | ICD-10-CM | POA: Diagnosis not present

## 2013-08-02 DIAGNOSIS — Q6530 Congenital partial dislocation of unspecified hip, unilateral: Secondary | ICD-10-CM | POA: Diagnosis not present

## 2013-08-11 DIAGNOSIS — Q6500 Congenital dislocation of unspecified hip, unilateral: Secondary | ICD-10-CM | POA: Diagnosis not present

## 2013-08-11 DIAGNOSIS — Q6530 Congenital partial dislocation of unspecified hip, unilateral: Secondary | ICD-10-CM | POA: Diagnosis not present

## 2013-08-12 DIAGNOSIS — E119 Type 2 diabetes mellitus without complications: Secondary | ICD-10-CM | POA: Diagnosis not present

## 2013-08-12 DIAGNOSIS — F411 Generalized anxiety disorder: Secondary | ICD-10-CM | POA: Diagnosis not present

## 2013-08-12 DIAGNOSIS — I10 Essential (primary) hypertension: Secondary | ICD-10-CM | POA: Diagnosis not present

## 2013-08-12 DIAGNOSIS — F319 Bipolar disorder, unspecified: Secondary | ICD-10-CM | POA: Diagnosis not present

## 2013-08-16 DIAGNOSIS — M76899 Other specified enthesopathies of unspecified lower limb, excluding foot: Secondary | ICD-10-CM | POA: Diagnosis not present

## 2013-08-16 DIAGNOSIS — M5430 Sciatica, unspecified side: Secondary | ICD-10-CM | POA: Diagnosis not present

## 2013-08-17 DIAGNOSIS — Q6500 Congenital dislocation of unspecified hip, unilateral: Secondary | ICD-10-CM | POA: Diagnosis not present

## 2013-08-19 DIAGNOSIS — Q6500 Congenital dislocation of unspecified hip, unilateral: Secondary | ICD-10-CM | POA: Diagnosis not present

## 2013-08-22 DIAGNOSIS — M722 Plantar fascial fibromatosis: Secondary | ICD-10-CM | POA: Diagnosis not present

## 2013-08-29 DIAGNOSIS — M545 Low back pain, unspecified: Secondary | ICD-10-CM | POA: Diagnosis not present

## 2013-08-29 DIAGNOSIS — M5137 Other intervertebral disc degeneration, lumbosacral region: Secondary | ICD-10-CM | POA: Diagnosis not present

## 2013-08-29 DIAGNOSIS — M47817 Spondylosis without myelopathy or radiculopathy, lumbosacral region: Secondary | ICD-10-CM | POA: Diagnosis not present

## 2013-08-30 DIAGNOSIS — Q6500 Congenital dislocation of unspecified hip, unilateral: Secondary | ICD-10-CM | POA: Diagnosis not present

## 2013-09-01 DIAGNOSIS — M545 Low back pain, unspecified: Secondary | ICD-10-CM | POA: Diagnosis not present

## 2013-09-01 DIAGNOSIS — M5430 Sciatica, unspecified side: Secondary | ICD-10-CM | POA: Diagnosis not present

## 2013-09-01 DIAGNOSIS — M76899 Other specified enthesopathies of unspecified lower limb, excluding foot: Secondary | ICD-10-CM | POA: Diagnosis not present

## 2013-09-02 DIAGNOSIS — Q6500 Congenital dislocation of unspecified hip, unilateral: Secondary | ICD-10-CM | POA: Diagnosis not present

## 2013-09-02 DIAGNOSIS — Q6530 Congenital partial dislocation of unspecified hip, unilateral: Secondary | ICD-10-CM | POA: Diagnosis not present

## 2013-09-05 DIAGNOSIS — M25559 Pain in unspecified hip: Secondary | ICD-10-CM | POA: Diagnosis not present

## 2013-09-05 DIAGNOSIS — IMO0001 Reserved for inherently not codable concepts without codable children: Secondary | ICD-10-CM | POA: Diagnosis not present

## 2013-09-05 DIAGNOSIS — G8929 Other chronic pain: Secondary | ICD-10-CM | POA: Diagnosis not present

## 2013-09-05 DIAGNOSIS — F319 Bipolar disorder, unspecified: Secondary | ICD-10-CM | POA: Diagnosis not present

## 2013-09-12 DIAGNOSIS — M722 Plantar fascial fibromatosis: Secondary | ICD-10-CM | POA: Diagnosis not present

## 2013-09-13 DIAGNOSIS — Q6530 Congenital partial dislocation of unspecified hip, unilateral: Secondary | ICD-10-CM | POA: Diagnosis not present

## 2013-09-13 DIAGNOSIS — Q6500 Congenital dislocation of unspecified hip, unilateral: Secondary | ICD-10-CM | POA: Diagnosis not present

## 2013-09-15 DIAGNOSIS — M545 Low back pain, unspecified: Secondary | ICD-10-CM | POA: Diagnosis not present

## 2013-09-15 DIAGNOSIS — Q6500 Congenital dislocation of unspecified hip, unilateral: Secondary | ICD-10-CM | POA: Diagnosis not present

## 2013-09-15 DIAGNOSIS — Q6530 Congenital partial dislocation of unspecified hip, unilateral: Secondary | ICD-10-CM | POA: Diagnosis not present

## 2013-09-19 DIAGNOSIS — L02419 Cutaneous abscess of limb, unspecified: Secondary | ICD-10-CM | POA: Diagnosis not present

## 2013-09-19 DIAGNOSIS — E1142 Type 2 diabetes mellitus with diabetic polyneuropathy: Secondary | ICD-10-CM | POA: Diagnosis not present

## 2013-09-19 DIAGNOSIS — E1149 Type 2 diabetes mellitus with other diabetic neurological complication: Secondary | ICD-10-CM | POA: Diagnosis not present

## 2013-09-22 DIAGNOSIS — Q6500 Congenital dislocation of unspecified hip, unilateral: Secondary | ICD-10-CM | POA: Diagnosis not present

## 2013-09-22 DIAGNOSIS — Q6530 Congenital partial dislocation of unspecified hip, unilateral: Secondary | ICD-10-CM | POA: Diagnosis not present

## 2013-09-23 DIAGNOSIS — L03119 Cellulitis of unspecified part of limb: Secondary | ICD-10-CM | POA: Diagnosis not present

## 2013-09-23 DIAGNOSIS — L02419 Cutaneous abscess of limb, unspecified: Secondary | ICD-10-CM | POA: Diagnosis not present

## 2013-10-05 DIAGNOSIS — M545 Low back pain, unspecified: Secondary | ICD-10-CM | POA: Diagnosis not present

## 2013-10-05 DIAGNOSIS — M5380 Other specified dorsopathies, site unspecified: Secondary | ICD-10-CM | POA: Diagnosis not present

## 2013-10-05 DIAGNOSIS — M461 Sacroiliitis, not elsewhere classified: Secondary | ICD-10-CM | POA: Diagnosis not present

## 2013-10-06 DIAGNOSIS — M722 Plantar fascial fibromatosis: Secondary | ICD-10-CM | POA: Diagnosis not present

## 2013-10-07 DIAGNOSIS — S6990XA Unspecified injury of unspecified wrist, hand and finger(s), initial encounter: Secondary | ICD-10-CM | POA: Diagnosis not present

## 2013-10-07 DIAGNOSIS — M722 Plantar fascial fibromatosis: Secondary | ICD-10-CM | POA: Diagnosis not present

## 2013-10-07 DIAGNOSIS — E119 Type 2 diabetes mellitus without complications: Secondary | ICD-10-CM | POA: Diagnosis not present

## 2013-10-07 DIAGNOSIS — S6980XA Other specified injuries of unspecified wrist, hand and finger(s), initial encounter: Secondary | ICD-10-CM | POA: Diagnosis not present

## 2013-10-07 DIAGNOSIS — F411 Generalized anxiety disorder: Secondary | ICD-10-CM | POA: Diagnosis not present

## 2013-10-11 DIAGNOSIS — M545 Low back pain, unspecified: Secondary | ICD-10-CM | POA: Diagnosis not present

## 2013-10-12 DIAGNOSIS — M722 Plantar fascial fibromatosis: Secondary | ICD-10-CM | POA: Diagnosis not present

## 2013-10-17 ENCOUNTER — Telehealth (INDEPENDENT_AMBULATORY_CARE_PROVIDER_SITE_OTHER): Payer: Self-pay | Admitting: Orthopaedic Surgery

## 2013-10-17 NOTE — Telephone Encounter (Signed)
Patient called stating she notice 3wks ago the left thigh mass was back, she had surgery on 03/28/2008 to remove left thigh mass Dr.Kulidjian patient wants to come back in. Schedule for next available of 11/10/13 at 11:20am, patient can only do mornings. Please advice if patient can wait until 11/10/13.

## 2013-10-19 DIAGNOSIS — M722 Plantar fascial fibromatosis: Secondary | ICD-10-CM | POA: Diagnosis not present

## 2013-10-19 NOTE — Telephone Encounter (Signed)
Wk on msg from [place] ok to schedule patient to 11/03/2013 at 9:40am, called patient confirmed time and date to appt.       Closed encounter matter resolved.

## 2013-10-21 DIAGNOSIS — M722 Plantar fascial fibromatosis: Secondary | ICD-10-CM | POA: Diagnosis not present

## 2013-10-26 DIAGNOSIS — M722 Plantar fascial fibromatosis: Secondary | ICD-10-CM | POA: Diagnosis not present

## 2013-10-28 DIAGNOSIS — M722 Plantar fascial fibromatosis: Secondary | ICD-10-CM | POA: Diagnosis not present

## 2013-11-03 ENCOUNTER — Encounter (INDEPENDENT_AMBULATORY_CARE_PROVIDER_SITE_OTHER): Payer: Medicare Other | Admitting: Orthopaedic Surgery

## 2013-11-03 DIAGNOSIS — M461 Sacroiliitis, not elsewhere classified: Secondary | ICD-10-CM | POA: Diagnosis not present

## 2013-11-09 DIAGNOSIS — M545 Low back pain, unspecified: Secondary | ICD-10-CM | POA: Diagnosis not present

## 2013-11-09 DIAGNOSIS — M76899 Other specified enthesopathies of unspecified lower limb, excluding foot: Secondary | ICD-10-CM | POA: Diagnosis not present

## 2013-11-09 DIAGNOSIS — M5430 Sciatica, unspecified side: Secondary | ICD-10-CM | POA: Diagnosis not present

## 2013-11-10 ENCOUNTER — Encounter (INDEPENDENT_AMBULATORY_CARE_PROVIDER_SITE_OTHER): Payer: Medicare Other | Admitting: Orthopaedic Surgery

## 2013-11-16 DIAGNOSIS — M461 Sacroiliitis, not elsewhere classified: Secondary | ICD-10-CM | POA: Diagnosis not present

## 2013-11-16 DIAGNOSIS — M5380 Other specified dorsopathies, site unspecified: Secondary | ICD-10-CM | POA: Diagnosis not present

## 2013-12-15 ENCOUNTER — Encounter (INDEPENDENT_AMBULATORY_CARE_PROVIDER_SITE_OTHER): Payer: Medicare Other | Admitting: Orthopaedic Surgery

## 2013-12-29 DIAGNOSIS — M79609 Pain in unspecified limb: Secondary | ICD-10-CM | POA: Diagnosis not present

## 2013-12-29 DIAGNOSIS — L600 Ingrowing nail: Secondary | ICD-10-CM | POA: Diagnosis not present

## 2013-12-29 DIAGNOSIS — E1149 Type 2 diabetes mellitus with other diabetic neurological complication: Secondary | ICD-10-CM | POA: Diagnosis not present

## 2014-01-12 DIAGNOSIS — M461 Sacroiliitis, not elsewhere classified: Secondary | ICD-10-CM | POA: Diagnosis not present

## 2014-01-25 DIAGNOSIS — G43109 Migraine with aura, not intractable, without status migrainosus: Secondary | ICD-10-CM | POA: Diagnosis not present

## 2014-01-25 DIAGNOSIS — M5380 Other specified dorsopathies, site unspecified: Secondary | ICD-10-CM | POA: Diagnosis not present

## 2014-01-25 DIAGNOSIS — M461 Sacroiliitis, not elsewhere classified: Secondary | ICD-10-CM | POA: Diagnosis not present

## 2014-01-26 DIAGNOSIS — M461 Sacroiliitis, not elsewhere classified: Secondary | ICD-10-CM | POA: Diagnosis not present

## 2014-02-23 DIAGNOSIS — Z1283 Encounter for screening for malignant neoplasm of skin: Secondary | ICD-10-CM | POA: Diagnosis not present

## 2014-02-23 DIAGNOSIS — L919 Hypertrophic disorder of the skin, unspecified: Secondary | ICD-10-CM | POA: Diagnosis not present

## 2014-02-23 DIAGNOSIS — L280 Lichen simplex chronicus: Secondary | ICD-10-CM | POA: Diagnosis not present

## 2014-02-23 DIAGNOSIS — L909 Atrophic disorder of skin, unspecified: Secondary | ICD-10-CM | POA: Diagnosis not present

## 2014-02-23 DIAGNOSIS — L821 Other seborrheic keratosis: Secondary | ICD-10-CM | POA: Diagnosis not present

## 2014-02-25 DIAGNOSIS — Z794 Long term (current) use of insulin: Secondary | ICD-10-CM | POA: Diagnosis not present

## 2014-02-25 DIAGNOSIS — E1121 Type 2 diabetes mellitus with diabetic nephropathy: Secondary | ICD-10-CM | POA: Diagnosis present

## 2014-02-25 DIAGNOSIS — Z91041 Radiographic dye allergy status: Secondary | ICD-10-CM | POA: Diagnosis not present

## 2014-02-25 DIAGNOSIS — R0602 Shortness of breath: Secondary | ICD-10-CM | POA: Diagnosis not present

## 2014-02-25 DIAGNOSIS — J810 Acute pulmonary edema: Secondary | ICD-10-CM | POA: Diagnosis not present

## 2014-02-25 DIAGNOSIS — R0902 Hypoxemia: Secondary | ICD-10-CM | POA: Diagnosis not present

## 2014-02-25 DIAGNOSIS — I509 Heart failure, unspecified: Secondary | ICD-10-CM | POA: Diagnosis not present

## 2014-02-25 DIAGNOSIS — I5043 Acute on chronic combined systolic (congestive) and diastolic (congestive) heart failure: Secondary | ICD-10-CM | POA: Diagnosis not present

## 2014-02-25 DIAGNOSIS — Z6839 Body mass index (BMI) 39.0-39.9, adult: Secondary | ICD-10-CM | POA: Diagnosis not present

## 2014-02-25 DIAGNOSIS — J9600 Acute respiratory failure, unspecified whether with hypoxia or hypercapnia: Secondary | ICD-10-CM | POA: Diagnosis not present

## 2014-02-25 DIAGNOSIS — I4891 Unspecified atrial fibrillation: Secondary | ICD-10-CM | POA: Diagnosis not present

## 2014-02-25 DIAGNOSIS — J9819 Other pulmonary collapse: Secondary | ICD-10-CM | POA: Diagnosis not present

## 2014-02-25 DIAGNOSIS — N183 Chronic kidney disease, stage 3 (moderate): Secondary | ICD-10-CM | POA: Diagnosis not present

## 2014-02-25 DIAGNOSIS — I5021 Acute systolic (congestive) heart failure: Secondary | ICD-10-CM | POA: Diagnosis not present

## 2014-02-25 DIAGNOSIS — E785 Hyperlipidemia, unspecified: Secondary | ICD-10-CM | POA: Diagnosis present

## 2014-02-25 DIAGNOSIS — G4733 Obstructive sleep apnea (adult) (pediatric): Secondary | ICD-10-CM | POA: Diagnosis present

## 2014-02-25 DIAGNOSIS — F319 Bipolar disorder, unspecified: Secondary | ICD-10-CM | POA: Diagnosis present

## 2014-02-25 DIAGNOSIS — R000 Tachycardia, unspecified: Secondary | ICD-10-CM | POA: Diagnosis not present

## 2014-02-25 DIAGNOSIS — R0989 Other specified symptoms and signs involving the circulatory and respiratory systems: Secondary | ICD-10-CM | POA: Diagnosis not present

## 2014-02-25 DIAGNOSIS — I059 Rheumatic mitral valve disease, unspecified: Secondary | ICD-10-CM | POA: Diagnosis not present

## 2014-02-25 DIAGNOSIS — F419 Anxiety disorder, unspecified: Secondary | ICD-10-CM | POA: Diagnosis not present

## 2014-02-25 DIAGNOSIS — J90 Pleural effusion, not elsewhere classified: Secondary | ICD-10-CM | POA: Diagnosis not present

## 2014-03-06 DIAGNOSIS — G9389 Other specified disorders of brain: Secondary | ICD-10-CM | POA: Diagnosis not present

## 2014-03-06 DIAGNOSIS — I6350 Cerebral infarction due to unspecified occlusion or stenosis of unspecified cerebral artery: Secondary | ICD-10-CM | POA: Diagnosis not present

## 2014-03-06 DIAGNOSIS — I10 Essential (primary) hypertension: Secondary | ICD-10-CM | POA: Diagnosis present

## 2014-03-06 DIAGNOSIS — Z91041 Radiographic dye allergy status: Secondary | ICD-10-CM | POA: Diagnosis not present

## 2014-03-06 DIAGNOSIS — Z0389 Encounter for observation for other suspected diseases and conditions ruled out: Secondary | ICD-10-CM | POA: Diagnosis not present

## 2014-03-06 DIAGNOSIS — R4182 Altered mental status, unspecified: Secondary | ICD-10-CM | POA: Diagnosis not present

## 2014-03-06 DIAGNOSIS — I5021 Acute systolic (congestive) heart failure: Secondary | ICD-10-CM | POA: Diagnosis not present

## 2014-03-06 DIAGNOSIS — I517 Cardiomegaly: Secondary | ICD-10-CM | POA: Diagnosis not present

## 2014-03-06 DIAGNOSIS — I509 Heart failure, unspecified: Secondary | ICD-10-CM | POA: Diagnosis not present

## 2014-03-06 DIAGNOSIS — R414 Neurologic neglect syndrome: Secondary | ICD-10-CM | POA: Diagnosis not present

## 2014-03-06 DIAGNOSIS — R0602 Shortness of breath: Secondary | ICD-10-CM | POA: Diagnosis not present

## 2014-03-06 DIAGNOSIS — E119 Type 2 diabetes mellitus without complications: Secondary | ICD-10-CM | POA: Diagnosis not present

## 2014-03-06 DIAGNOSIS — I4901 Ventricular fibrillation: Secondary | ICD-10-CM | POA: Diagnosis not present

## 2014-03-06 DIAGNOSIS — R531 Weakness: Secondary | ICD-10-CM | POA: Diagnosis not present

## 2014-03-06 DIAGNOSIS — R0600 Dyspnea, unspecified: Secondary | ICD-10-CM | POA: Diagnosis not present

## 2014-03-06 DIAGNOSIS — R4781 Slurred speech: Secondary | ICD-10-CM | POA: Diagnosis not present

## 2014-03-06 DIAGNOSIS — I5023 Acute on chronic systolic (congestive) heart failure: Secondary | ICD-10-CM | POA: Diagnosis not present

## 2014-03-06 DIAGNOSIS — I482 Chronic atrial fibrillation: Secondary | ICD-10-CM | POA: Diagnosis not present

## 2014-03-06 DIAGNOSIS — R0689 Other abnormalities of breathing: Secondary | ICD-10-CM | POA: Diagnosis not present

## 2014-03-06 DIAGNOSIS — I4891 Unspecified atrial fibrillation: Secondary | ICD-10-CM | POA: Diagnosis not present

## 2014-03-06 DIAGNOSIS — F319 Bipolar disorder, unspecified: Secondary | ICD-10-CM | POA: Diagnosis present

## 2014-03-09 DIAGNOSIS — F319 Bipolar disorder, unspecified: Secondary | ICD-10-CM | POA: Diagnosis not present

## 2014-03-09 DIAGNOSIS — I509 Heart failure, unspecified: Secondary | ICD-10-CM | POA: Diagnosis not present

## 2014-03-09 DIAGNOSIS — G8929 Other chronic pain: Secondary | ICD-10-CM | POA: Diagnosis not present

## 2014-03-09 DIAGNOSIS — F339 Major depressive disorder, recurrent, unspecified: Secondary | ICD-10-CM | POA: Diagnosis not present

## 2014-03-09 DIAGNOSIS — Z794 Long term (current) use of insulin: Secondary | ICD-10-CM | POA: Diagnosis not present

## 2014-03-09 DIAGNOSIS — I110 Hypertensive heart disease with heart failure: Secondary | ICD-10-CM | POA: Diagnosis not present

## 2014-03-09 DIAGNOSIS — I480 Paroxysmal atrial fibrillation: Secondary | ICD-10-CM | POA: Diagnosis not present

## 2014-03-09 DIAGNOSIS — I482 Chronic atrial fibrillation: Secondary | ICD-10-CM | POA: Diagnosis not present

## 2014-03-09 DIAGNOSIS — E1140 Type 2 diabetes mellitus with diabetic neuropathy, unspecified: Secondary | ICD-10-CM | POA: Diagnosis not present

## 2014-03-09 DIAGNOSIS — G894 Chronic pain syndrome: Secondary | ICD-10-CM | POA: Diagnosis not present

## 2014-03-09 DIAGNOSIS — I5020 Unspecified systolic (congestive) heart failure: Secondary | ICD-10-CM | POA: Diagnosis not present

## 2014-03-09 DIAGNOSIS — I4891 Unspecified atrial fibrillation: Secondary | ICD-10-CM | POA: Diagnosis not present

## 2014-03-13 DIAGNOSIS — G8929 Other chronic pain: Secondary | ICD-10-CM | POA: Diagnosis not present

## 2014-03-13 DIAGNOSIS — E1140 Type 2 diabetes mellitus with diabetic neuropathy, unspecified: Secondary | ICD-10-CM | POA: Diagnosis not present

## 2014-03-13 DIAGNOSIS — I5040 Unspecified combined systolic (congestive) and diastolic (congestive) heart failure: Secondary | ICD-10-CM | POA: Diagnosis not present

## 2014-03-13 DIAGNOSIS — I509 Heart failure, unspecified: Secondary | ICD-10-CM | POA: Diagnosis not present

## 2014-03-13 DIAGNOSIS — I481 Persistent atrial fibrillation: Secondary | ICD-10-CM | POA: Diagnosis not present

## 2014-03-13 DIAGNOSIS — F319 Bipolar disorder, unspecified: Secondary | ICD-10-CM | POA: Diagnosis not present

## 2014-03-13 DIAGNOSIS — I4891 Unspecified atrial fibrillation: Secondary | ICD-10-CM | POA: Diagnosis not present

## 2014-03-13 DIAGNOSIS — F339 Major depressive disorder, recurrent, unspecified: Secondary | ICD-10-CM | POA: Diagnosis not present

## 2014-03-15 DIAGNOSIS — R9431 Abnormal electrocardiogram [ECG] [EKG]: Secondary | ICD-10-CM | POA: Diagnosis not present

## 2014-03-15 DIAGNOSIS — R0602 Shortness of breath: Secondary | ICD-10-CM | POA: Diagnosis not present

## 2014-03-15 DIAGNOSIS — I10 Essential (primary) hypertension: Secondary | ICD-10-CM | POA: Diagnosis not present

## 2014-03-16 DIAGNOSIS — R0689 Other abnormalities of breathing: Secondary | ICD-10-CM | POA: Diagnosis not present

## 2014-03-16 DIAGNOSIS — J9600 Acute respiratory failure, unspecified whether with hypoxia or hypercapnia: Secondary | ICD-10-CM | POA: Diagnosis not present

## 2014-03-16 DIAGNOSIS — I959 Hypotension, unspecified: Secondary | ICD-10-CM | POA: Diagnosis not present

## 2014-03-16 DIAGNOSIS — R42 Dizziness and giddiness: Secondary | ICD-10-CM | POA: Diagnosis not present

## 2014-03-16 DIAGNOSIS — I4891 Unspecified atrial fibrillation: Secondary | ICD-10-CM | POA: Diagnosis not present

## 2014-03-16 DIAGNOSIS — R531 Weakness: Secondary | ICD-10-CM | POA: Diagnosis not present

## 2014-03-16 DIAGNOSIS — R0902 Hypoxemia: Secondary | ICD-10-CM | POA: Diagnosis not present

## 2014-03-16 DIAGNOSIS — J9811 Atelectasis: Secondary | ICD-10-CM | POA: Diagnosis not present

## 2014-03-16 DIAGNOSIS — M546 Pain in thoracic spine: Secondary | ICD-10-CM | POA: Diagnosis not present

## 2014-03-16 DIAGNOSIS — J810 Acute pulmonary edema: Secondary | ICD-10-CM | POA: Diagnosis not present

## 2014-03-16 DIAGNOSIS — I10 Essential (primary) hypertension: Secondary | ICD-10-CM | POA: Diagnosis not present

## 2014-03-16 DIAGNOSIS — G4733 Obstructive sleep apnea (adult) (pediatric): Secondary | ICD-10-CM | POA: Diagnosis not present

## 2014-03-16 DIAGNOSIS — R4182 Altered mental status, unspecified: Secondary | ICD-10-CM | POA: Diagnosis not present

## 2014-03-16 DIAGNOSIS — D72829 Elevated white blood cell count, unspecified: Secondary | ICD-10-CM | POA: Diagnosis not present

## 2014-03-16 DIAGNOSIS — I482 Chronic atrial fibrillation: Secondary | ICD-10-CM | POA: Diagnosis not present

## 2014-03-16 DIAGNOSIS — E872 Acidosis: Secondary | ICD-10-CM | POA: Diagnosis not present

## 2014-03-16 DIAGNOSIS — Z6841 Body Mass Index (BMI) 40.0 and over, adult: Secondary | ICD-10-CM | POA: Diagnosis not present

## 2014-03-16 DIAGNOSIS — I5033 Acute on chronic diastolic (congestive) heart failure: Secondary | ICD-10-CM | POA: Diagnosis not present

## 2014-03-16 DIAGNOSIS — J9601 Acute respiratory failure with hypoxia: Secondary | ICD-10-CM | POA: Diagnosis not present

## 2014-03-19 DIAGNOSIS — E1165 Type 2 diabetes mellitus with hyperglycemia: Secondary | ICD-10-CM | POA: Diagnosis present

## 2014-03-19 DIAGNOSIS — I10 Essential (primary) hypertension: Secondary | ICD-10-CM | POA: Diagnosis not present

## 2014-03-19 DIAGNOSIS — I313 Pericardial effusion (noninflammatory): Secondary | ICD-10-CM | POA: Diagnosis not present

## 2014-03-19 DIAGNOSIS — N183 Chronic kidney disease, stage 3 (moderate): Secondary | ICD-10-CM | POA: Diagnosis not present

## 2014-03-19 DIAGNOSIS — R0902 Hypoxemia: Secondary | ICD-10-CM | POA: Diagnosis not present

## 2014-03-19 DIAGNOSIS — G8929 Other chronic pain: Secondary | ICD-10-CM | POA: Diagnosis present

## 2014-03-19 DIAGNOSIS — Z7901 Long term (current) use of anticoagulants: Secondary | ICD-10-CM | POA: Diagnosis not present

## 2014-03-19 DIAGNOSIS — I129 Hypertensive chronic kidney disease with stage 1 through stage 4 chronic kidney disease, or unspecified chronic kidney disease: Secondary | ICD-10-CM | POA: Diagnosis present

## 2014-03-19 DIAGNOSIS — J9690 Respiratory failure, unspecified, unspecified whether with hypoxia or hypercapnia: Secondary | ICD-10-CM | POA: Diagnosis not present

## 2014-03-19 DIAGNOSIS — J9600 Acute respiratory failure, unspecified whether with hypoxia or hypercapnia: Secondary | ICD-10-CM | POA: Diagnosis not present

## 2014-03-19 DIAGNOSIS — I248 Other forms of acute ischemic heart disease: Secondary | ICD-10-CM | POA: Diagnosis not present

## 2014-03-19 DIAGNOSIS — Z794 Long term (current) use of insulin: Secondary | ICD-10-CM | POA: Diagnosis not present

## 2014-03-19 DIAGNOSIS — I4891 Unspecified atrial fibrillation: Secondary | ICD-10-CM | POA: Diagnosis not present

## 2014-03-19 DIAGNOSIS — I5033 Acute on chronic diastolic (congestive) heart failure: Secondary | ICD-10-CM | POA: Diagnosis not present

## 2014-03-19 DIAGNOSIS — G250 Essential tremor: Secondary | ICD-10-CM | POA: Diagnosis present

## 2014-03-19 DIAGNOSIS — I517 Cardiomegaly: Secondary | ICD-10-CM | POA: Diagnosis not present

## 2014-03-19 DIAGNOSIS — I480 Paroxysmal atrial fibrillation: Secondary | ICD-10-CM | POA: Diagnosis not present

## 2014-03-19 DIAGNOSIS — E872 Acidosis: Secondary | ICD-10-CM | POA: Diagnosis present

## 2014-03-19 DIAGNOSIS — I2510 Atherosclerotic heart disease of native coronary artery without angina pectoris: Secondary | ICD-10-CM | POA: Diagnosis not present

## 2014-03-19 DIAGNOSIS — D72829 Elevated white blood cell count, unspecified: Secondary | ICD-10-CM | POA: Diagnosis not present

## 2014-03-19 DIAGNOSIS — I481 Persistent atrial fibrillation: Secondary | ICD-10-CM | POA: Diagnosis not present

## 2014-03-19 DIAGNOSIS — Z6841 Body Mass Index (BMI) 40.0 and over, adult: Secondary | ICD-10-CM | POA: Diagnosis not present

## 2014-03-19 DIAGNOSIS — F319 Bipolar disorder, unspecified: Secondary | ICD-10-CM | POA: Diagnosis present

## 2014-03-19 DIAGNOSIS — I509 Heart failure, unspecified: Secondary | ICD-10-CM | POA: Diagnosis not present

## 2014-03-19 DIAGNOSIS — J449 Chronic obstructive pulmonary disease, unspecified: Secondary | ICD-10-CM | POA: Diagnosis not present

## 2014-03-19 DIAGNOSIS — J810 Acute pulmonary edema: Secondary | ICD-10-CM | POA: Diagnosis not present

## 2014-03-19 DIAGNOSIS — E1121 Type 2 diabetes mellitus with diabetic nephropathy: Secondary | ICD-10-CM | POA: Diagnosis not present

## 2014-03-19 DIAGNOSIS — R918 Other nonspecific abnormal finding of lung field: Secondary | ICD-10-CM | POA: Diagnosis not present

## 2014-03-19 DIAGNOSIS — I482 Chronic atrial fibrillation: Secondary | ICD-10-CM | POA: Diagnosis present

## 2014-03-19 DIAGNOSIS — E785 Hyperlipidemia, unspecified: Secondary | ICD-10-CM | POA: Diagnosis present

## 2014-03-19 DIAGNOSIS — J9601 Acute respiratory failure with hypoxia: Secondary | ICD-10-CM | POA: Diagnosis not present

## 2014-03-19 DIAGNOSIS — M549 Dorsalgia, unspecified: Secondary | ICD-10-CM | POA: Diagnosis present

## 2014-03-19 DIAGNOSIS — J189 Pneumonia, unspecified organism: Secondary | ICD-10-CM | POA: Diagnosis not present

## 2014-03-19 DIAGNOSIS — G4733 Obstructive sleep apnea (adult) (pediatric): Secondary | ICD-10-CM | POA: Diagnosis present

## 2014-03-19 DIAGNOSIS — R0602 Shortness of breath: Secondary | ICD-10-CM | POA: Diagnosis not present

## 2014-03-28 DIAGNOSIS — I4891 Unspecified atrial fibrillation: Secondary | ICD-10-CM | POA: Diagnosis not present

## 2014-03-29 DIAGNOSIS — R002 Palpitations: Secondary | ICD-10-CM | POA: Diagnosis not present

## 2014-03-29 DIAGNOSIS — R42 Dizziness and giddiness: Secondary | ICD-10-CM | POA: Diagnosis not present

## 2014-03-29 DIAGNOSIS — G4733 Obstructive sleep apnea (adult) (pediatric): Secondary | ICD-10-CM | POA: Diagnosis not present

## 2014-03-29 DIAGNOSIS — I480 Paroxysmal atrial fibrillation: Secondary | ICD-10-CM | POA: Diagnosis not present

## 2014-04-06 DIAGNOSIS — I509 Heart failure, unspecified: Secondary | ICD-10-CM | POA: Diagnosis not present

## 2014-04-06 DIAGNOSIS — E1140 Type 2 diabetes mellitus with diabetic neuropathy, unspecified: Secondary | ICD-10-CM | POA: Diagnosis not present

## 2014-04-06 DIAGNOSIS — F319 Bipolar disorder, unspecified: Secondary | ICD-10-CM | POA: Diagnosis not present

## 2014-04-06 DIAGNOSIS — F339 Major depressive disorder, recurrent, unspecified: Secondary | ICD-10-CM | POA: Diagnosis not present

## 2014-04-06 DIAGNOSIS — G8929 Other chronic pain: Secondary | ICD-10-CM | POA: Diagnosis not present

## 2014-04-06 DIAGNOSIS — I4891 Unspecified atrial fibrillation: Secondary | ICD-10-CM | POA: Diagnosis not present

## 2014-04-07 DIAGNOSIS — M6281 Muscle weakness (generalized): Secondary | ICD-10-CM | POA: Diagnosis not present

## 2014-04-07 DIAGNOSIS — F319 Bipolar disorder, unspecified: Secondary | ICD-10-CM | POA: Diagnosis not present

## 2014-04-07 DIAGNOSIS — M25511 Pain in right shoulder: Secondary | ICD-10-CM | POA: Diagnosis not present

## 2014-04-07 DIAGNOSIS — I482 Chronic atrial fibrillation: Secondary | ICD-10-CM | POA: Diagnosis not present

## 2014-04-11 DIAGNOSIS — I5032 Chronic diastolic (congestive) heart failure: Secondary | ICD-10-CM | POA: Diagnosis not present

## 2014-04-11 DIAGNOSIS — R0602 Shortness of breath: Secondary | ICD-10-CM | POA: Diagnosis not present

## 2014-04-11 DIAGNOSIS — I481 Persistent atrial fibrillation: Secondary | ICD-10-CM | POA: Diagnosis not present

## 2014-04-14 DIAGNOSIS — I4891 Unspecified atrial fibrillation: Secondary | ICD-10-CM | POA: Diagnosis not present

## 2014-04-14 DIAGNOSIS — G8929 Other chronic pain: Secondary | ICD-10-CM | POA: Diagnosis not present

## 2014-04-14 DIAGNOSIS — F339 Major depressive disorder, recurrent, unspecified: Secondary | ICD-10-CM | POA: Diagnosis not present

## 2014-04-14 DIAGNOSIS — I509 Heart failure, unspecified: Secondary | ICD-10-CM | POA: Diagnosis not present

## 2014-04-14 DIAGNOSIS — E1140 Type 2 diabetes mellitus with diabetic neuropathy, unspecified: Secondary | ICD-10-CM | POA: Diagnosis not present

## 2014-04-14 DIAGNOSIS — F319 Bipolar disorder, unspecified: Secondary | ICD-10-CM | POA: Diagnosis not present

## 2014-04-20 DIAGNOSIS — F319 Bipolar disorder, unspecified: Secondary | ICD-10-CM | POA: Diagnosis not present

## 2014-04-20 DIAGNOSIS — G8929 Other chronic pain: Secondary | ICD-10-CM | POA: Diagnosis not present

## 2014-04-20 DIAGNOSIS — E1140 Type 2 diabetes mellitus with diabetic neuropathy, unspecified: Secondary | ICD-10-CM | POA: Diagnosis not present

## 2014-04-20 DIAGNOSIS — I509 Heart failure, unspecified: Secondary | ICD-10-CM | POA: Diagnosis not present

## 2014-04-20 DIAGNOSIS — F339 Major depressive disorder, recurrent, unspecified: Secondary | ICD-10-CM | POA: Diagnosis not present

## 2014-04-20 DIAGNOSIS — I4891 Unspecified atrial fibrillation: Secondary | ICD-10-CM | POA: Diagnosis not present

## 2014-04-24 DIAGNOSIS — I4891 Unspecified atrial fibrillation: Secondary | ICD-10-CM | POA: Diagnosis not present

## 2014-04-24 DIAGNOSIS — E109 Type 1 diabetes mellitus without complications: Secondary | ICD-10-CM | POA: Diagnosis not present

## 2014-04-24 DIAGNOSIS — L989 Disorder of the skin and subcutaneous tissue, unspecified: Secondary | ICD-10-CM | POA: Diagnosis not present

## 2014-04-24 DIAGNOSIS — J069 Acute upper respiratory infection, unspecified: Secondary | ICD-10-CM | POA: Diagnosis not present

## 2014-04-26 DIAGNOSIS — R0602 Shortness of breath: Secondary | ICD-10-CM | POA: Diagnosis not present

## 2014-05-01 DIAGNOSIS — G43119 Migraine with aura, intractable, without status migrainosus: Secondary | ICD-10-CM | POA: Diagnosis not present

## 2014-05-01 DIAGNOSIS — Z79891 Long term (current) use of opiate analgesic: Secondary | ICD-10-CM | POA: Diagnosis not present

## 2014-05-01 DIAGNOSIS — M461 Sacroiliitis, not elsewhere classified: Secondary | ICD-10-CM | POA: Diagnosis not present

## 2014-05-04 DIAGNOSIS — Z7982 Long term (current) use of aspirin: Secondary | ICD-10-CM | POA: Diagnosis not present

## 2014-05-04 DIAGNOSIS — R918 Other nonspecific abnormal finding of lung field: Secondary | ICD-10-CM | POA: Diagnosis not present

## 2014-05-04 DIAGNOSIS — F319 Bipolar disorder, unspecified: Secondary | ICD-10-CM | POA: Diagnosis not present

## 2014-05-04 DIAGNOSIS — J90 Pleural effusion, not elsewhere classified: Secondary | ICD-10-CM | POA: Diagnosis not present

## 2014-05-04 DIAGNOSIS — I509 Heart failure, unspecified: Secondary | ICD-10-CM | POA: Diagnosis not present

## 2014-05-04 DIAGNOSIS — Z794 Long term (current) use of insulin: Secondary | ICD-10-CM | POA: Diagnosis not present

## 2014-05-04 DIAGNOSIS — I5031 Acute diastolic (congestive) heart failure: Secondary | ICD-10-CM | POA: Diagnosis not present

## 2014-05-04 DIAGNOSIS — M549 Dorsalgia, unspecified: Secondary | ICD-10-CM | POA: Diagnosis not present

## 2014-05-04 DIAGNOSIS — G4733 Obstructive sleep apnea (adult) (pediatric): Secondary | ICD-10-CM | POA: Diagnosis not present

## 2014-05-04 DIAGNOSIS — E785 Hyperlipidemia, unspecified: Secondary | ICD-10-CM | POA: Diagnosis not present

## 2014-05-04 DIAGNOSIS — I5033 Acute on chronic diastolic (congestive) heart failure: Secondary | ICD-10-CM | POA: Diagnosis not present

## 2014-05-04 DIAGNOSIS — I313 Pericardial effusion (noninflammatory): Secondary | ICD-10-CM | POA: Diagnosis not present

## 2014-05-04 DIAGNOSIS — E119 Type 2 diabetes mellitus without complications: Secondary | ICD-10-CM | POA: Diagnosis not present

## 2014-05-04 DIAGNOSIS — I482 Chronic atrial fibrillation: Secondary | ICD-10-CM | POA: Diagnosis not present

## 2014-05-04 DIAGNOSIS — G8929 Other chronic pain: Secondary | ICD-10-CM | POA: Diagnosis not present

## 2014-05-04 DIAGNOSIS — Z6837 Body mass index (BMI) 37.0-37.9, adult: Secondary | ICD-10-CM | POA: Diagnosis not present

## 2014-05-04 DIAGNOSIS — I480 Paroxysmal atrial fibrillation: Secondary | ICD-10-CM | POA: Diagnosis not present

## 2014-05-04 DIAGNOSIS — R0902 Hypoxemia: Secondary | ICD-10-CM | POA: Diagnosis not present

## 2014-05-04 DIAGNOSIS — E669 Obesity, unspecified: Secondary | ICD-10-CM | POA: Diagnosis not present

## 2014-05-04 DIAGNOSIS — I517 Cardiomegaly: Secondary | ICD-10-CM | POA: Diagnosis not present

## 2014-05-04 DIAGNOSIS — G43709 Chronic migraine without aura, not intractable, without status migrainosus: Secondary | ICD-10-CM | POA: Diagnosis not present

## 2014-05-04 DIAGNOSIS — J9811 Atelectasis: Secondary | ICD-10-CM | POA: Diagnosis not present

## 2014-05-05 DIAGNOSIS — E119 Type 2 diabetes mellitus without complications: Secondary | ICD-10-CM | POA: Diagnosis not present

## 2014-05-05 DIAGNOSIS — I5031 Acute diastolic (congestive) heart failure: Secondary | ICD-10-CM | POA: Diagnosis not present

## 2014-05-05 DIAGNOSIS — I482 Chronic atrial fibrillation: Secondary | ICD-10-CM | POA: Diagnosis not present

## 2014-05-06 DIAGNOSIS — E119 Type 2 diabetes mellitus without complications: Secondary | ICD-10-CM | POA: Diagnosis not present

## 2014-05-06 DIAGNOSIS — R0902 Hypoxemia: Secondary | ICD-10-CM | POA: Diagnosis not present

## 2014-05-06 DIAGNOSIS — I5031 Acute diastolic (congestive) heart failure: Secondary | ICD-10-CM | POA: Diagnosis not present

## 2014-05-06 DIAGNOSIS — I480 Paroxysmal atrial fibrillation: Secondary | ICD-10-CM | POA: Diagnosis not present

## 2014-05-09 DIAGNOSIS — E1140 Type 2 diabetes mellitus with diabetic neuropathy, unspecified: Secondary | ICD-10-CM | POA: Diagnosis not present

## 2014-05-09 DIAGNOSIS — E119 Type 2 diabetes mellitus without complications: Secondary | ICD-10-CM | POA: Diagnosis not present

## 2014-05-09 DIAGNOSIS — F339 Major depressive disorder, recurrent, unspecified: Secondary | ICD-10-CM | POA: Diagnosis not present

## 2014-05-09 DIAGNOSIS — I110 Hypertensive heart disease with heart failure: Secondary | ICD-10-CM | POA: Diagnosis not present

## 2014-05-09 DIAGNOSIS — F319 Bipolar disorder, unspecified: Secondary | ICD-10-CM | POA: Diagnosis not present

## 2014-05-09 DIAGNOSIS — I4891 Unspecified atrial fibrillation: Secondary | ICD-10-CM | POA: Diagnosis not present

## 2014-05-09 DIAGNOSIS — I5020 Unspecified systolic (congestive) heart failure: Secondary | ICD-10-CM | POA: Diagnosis not present

## 2014-05-09 DIAGNOSIS — I10 Essential (primary) hypertension: Secondary | ICD-10-CM | POA: Diagnosis not present

## 2014-05-09 DIAGNOSIS — I5023 Acute on chronic systolic (congestive) heart failure: Secondary | ICD-10-CM | POA: Diagnosis not present

## 2014-05-09 DIAGNOSIS — G8929 Other chronic pain: Secondary | ICD-10-CM | POA: Diagnosis not present

## 2014-05-11 DIAGNOSIS — I481 Persistent atrial fibrillation: Secondary | ICD-10-CM | POA: Diagnosis not present

## 2014-05-11 DIAGNOSIS — I5032 Chronic diastolic (congestive) heart failure: Secondary | ICD-10-CM | POA: Diagnosis not present

## 2014-05-11 DIAGNOSIS — I5022 Chronic systolic (congestive) heart failure: Secondary | ICD-10-CM | POA: Diagnosis not present

## 2014-05-12 DIAGNOSIS — F339 Major depressive disorder, recurrent, unspecified: Secondary | ICD-10-CM | POA: Diagnosis not present

## 2014-05-12 DIAGNOSIS — I10 Essential (primary) hypertension: Secondary | ICD-10-CM | POA: Diagnosis not present

## 2014-05-12 DIAGNOSIS — F319 Bipolar disorder, unspecified: Secondary | ICD-10-CM | POA: Diagnosis not present

## 2014-05-12 DIAGNOSIS — G8929 Other chronic pain: Secondary | ICD-10-CM | POA: Diagnosis not present

## 2014-05-12 DIAGNOSIS — J449 Chronic obstructive pulmonary disease, unspecified: Secondary | ICD-10-CM | POA: Diagnosis not present

## 2014-05-12 DIAGNOSIS — E119 Type 2 diabetes mellitus without complications: Secondary | ICD-10-CM | POA: Diagnosis not present

## 2014-05-12 DIAGNOSIS — I5030 Unspecified diastolic (congestive) heart failure: Secondary | ICD-10-CM | POA: Diagnosis not present

## 2014-05-12 DIAGNOSIS — I5023 Acute on chronic systolic (congestive) heart failure: Secondary | ICD-10-CM | POA: Diagnosis not present

## 2014-05-12 DIAGNOSIS — I482 Chronic atrial fibrillation: Secondary | ICD-10-CM | POA: Diagnosis not present

## 2014-05-15 DIAGNOSIS — I5023 Acute on chronic systolic (congestive) heart failure: Secondary | ICD-10-CM | POA: Diagnosis not present

## 2014-05-15 DIAGNOSIS — E119 Type 2 diabetes mellitus without complications: Secondary | ICD-10-CM | POA: Diagnosis not present

## 2014-05-15 DIAGNOSIS — G8929 Other chronic pain: Secondary | ICD-10-CM | POA: Diagnosis not present

## 2014-05-15 DIAGNOSIS — F339 Major depressive disorder, recurrent, unspecified: Secondary | ICD-10-CM | POA: Diagnosis not present

## 2014-05-15 DIAGNOSIS — I10 Essential (primary) hypertension: Secondary | ICD-10-CM | POA: Diagnosis not present

## 2014-05-15 DIAGNOSIS — F319 Bipolar disorder, unspecified: Secondary | ICD-10-CM | POA: Diagnosis not present

## 2014-05-16 DIAGNOSIS — G8929 Other chronic pain: Secondary | ICD-10-CM | POA: Diagnosis not present

## 2014-05-16 DIAGNOSIS — I10 Essential (primary) hypertension: Secondary | ICD-10-CM | POA: Diagnosis not present

## 2014-05-16 DIAGNOSIS — I5023 Acute on chronic systolic (congestive) heart failure: Secondary | ICD-10-CM | POA: Diagnosis not present

## 2014-05-16 DIAGNOSIS — E119 Type 2 diabetes mellitus without complications: Secondary | ICD-10-CM | POA: Diagnosis not present

## 2014-05-16 DIAGNOSIS — F319 Bipolar disorder, unspecified: Secondary | ICD-10-CM | POA: Diagnosis not present

## 2014-05-16 DIAGNOSIS — F339 Major depressive disorder, recurrent, unspecified: Secondary | ICD-10-CM | POA: Diagnosis not present

## 2014-05-17 DIAGNOSIS — I5023 Acute on chronic systolic (congestive) heart failure: Secondary | ICD-10-CM | POA: Diagnosis not present

## 2014-05-17 DIAGNOSIS — F319 Bipolar disorder, unspecified: Secondary | ICD-10-CM | POA: Diagnosis not present

## 2014-05-17 DIAGNOSIS — I10 Essential (primary) hypertension: Secondary | ICD-10-CM | POA: Diagnosis not present

## 2014-05-17 DIAGNOSIS — F339 Major depressive disorder, recurrent, unspecified: Secondary | ICD-10-CM | POA: Diagnosis not present

## 2014-05-17 DIAGNOSIS — E119 Type 2 diabetes mellitus without complications: Secondary | ICD-10-CM | POA: Diagnosis not present

## 2014-05-17 DIAGNOSIS — G8929 Other chronic pain: Secondary | ICD-10-CM | POA: Diagnosis not present

## 2014-05-19 DIAGNOSIS — I482 Chronic atrial fibrillation: Secondary | ICD-10-CM | POA: Diagnosis not present

## 2014-05-19 DIAGNOSIS — E669 Obesity, unspecified: Secondary | ICD-10-CM | POA: Diagnosis not present

## 2014-05-19 DIAGNOSIS — I517 Cardiomegaly: Secondary | ICD-10-CM | POA: Diagnosis not present

## 2014-05-19 DIAGNOSIS — R0600 Dyspnea, unspecified: Secondary | ICD-10-CM | POA: Diagnosis not present

## 2014-05-22 DIAGNOSIS — F339 Major depressive disorder, recurrent, unspecified: Secondary | ICD-10-CM | POA: Diagnosis not present

## 2014-05-22 DIAGNOSIS — I5023 Acute on chronic systolic (congestive) heart failure: Secondary | ICD-10-CM | POA: Diagnosis not present

## 2014-05-22 DIAGNOSIS — F319 Bipolar disorder, unspecified: Secondary | ICD-10-CM | POA: Diagnosis not present

## 2014-05-22 DIAGNOSIS — E119 Type 2 diabetes mellitus without complications: Secondary | ICD-10-CM | POA: Diagnosis not present

## 2014-05-22 DIAGNOSIS — I10 Essential (primary) hypertension: Secondary | ICD-10-CM | POA: Diagnosis not present

## 2014-05-22 DIAGNOSIS — G8929 Other chronic pain: Secondary | ICD-10-CM | POA: Diagnosis not present

## 2014-05-24 DIAGNOSIS — I10 Essential (primary) hypertension: Secondary | ICD-10-CM | POA: Diagnosis not present

## 2014-05-24 DIAGNOSIS — E1140 Type 2 diabetes mellitus with diabetic neuropathy, unspecified: Secondary | ICD-10-CM | POA: Diagnosis not present

## 2014-05-24 DIAGNOSIS — I5023 Acute on chronic systolic (congestive) heart failure: Secondary | ICD-10-CM | POA: Diagnosis not present

## 2014-05-24 DIAGNOSIS — L600 Ingrowing nail: Secondary | ICD-10-CM | POA: Diagnosis not present

## 2014-05-24 DIAGNOSIS — F319 Bipolar disorder, unspecified: Secondary | ICD-10-CM | POA: Diagnosis not present

## 2014-05-24 DIAGNOSIS — G8929 Other chronic pain: Secondary | ICD-10-CM | POA: Diagnosis not present

## 2014-05-24 DIAGNOSIS — J440 Chronic obstructive pulmonary disease with acute lower respiratory infection: Secondary | ICD-10-CM | POA: Diagnosis not present

## 2014-05-24 DIAGNOSIS — F339 Major depressive disorder, recurrent, unspecified: Secondary | ICD-10-CM | POA: Diagnosis not present

## 2014-05-24 DIAGNOSIS — E119 Type 2 diabetes mellitus without complications: Secondary | ICD-10-CM | POA: Diagnosis not present

## 2014-05-29 DIAGNOSIS — G8929 Other chronic pain: Secondary | ICD-10-CM | POA: Diagnosis not present

## 2014-05-29 DIAGNOSIS — F319 Bipolar disorder, unspecified: Secondary | ICD-10-CM | POA: Diagnosis not present

## 2014-05-29 DIAGNOSIS — E119 Type 2 diabetes mellitus without complications: Secondary | ICD-10-CM | POA: Diagnosis not present

## 2014-05-29 DIAGNOSIS — I10 Essential (primary) hypertension: Secondary | ICD-10-CM | POA: Diagnosis not present

## 2014-05-29 DIAGNOSIS — I5023 Acute on chronic systolic (congestive) heart failure: Secondary | ICD-10-CM | POA: Diagnosis not present

## 2014-05-29 DIAGNOSIS — F339 Major depressive disorder, recurrent, unspecified: Secondary | ICD-10-CM | POA: Diagnosis not present

## 2014-05-31 DIAGNOSIS — E119 Type 2 diabetes mellitus without complications: Secondary | ICD-10-CM | POA: Diagnosis not present

## 2014-05-31 DIAGNOSIS — L03032 Cellulitis of left toe: Secondary | ICD-10-CM | POA: Diagnosis not present

## 2014-05-31 DIAGNOSIS — M79675 Pain in left toe(s): Secondary | ICD-10-CM | POA: Diagnosis not present

## 2014-06-05 DIAGNOSIS — F319 Bipolar disorder, unspecified: Secondary | ICD-10-CM | POA: Diagnosis not present

## 2014-06-05 DIAGNOSIS — I10 Essential (primary) hypertension: Secondary | ICD-10-CM | POA: Diagnosis not present

## 2014-06-05 DIAGNOSIS — F339 Major depressive disorder, recurrent, unspecified: Secondary | ICD-10-CM | POA: Diagnosis not present

## 2014-06-05 DIAGNOSIS — G8929 Other chronic pain: Secondary | ICD-10-CM | POA: Diagnosis not present

## 2014-06-05 DIAGNOSIS — E119 Type 2 diabetes mellitus without complications: Secondary | ICD-10-CM | POA: Diagnosis not present

## 2014-06-05 DIAGNOSIS — I5023 Acute on chronic systolic (congestive) heart failure: Secondary | ICD-10-CM | POA: Diagnosis not present

## 2014-06-06 DIAGNOSIS — I481 Persistent atrial fibrillation: Secondary | ICD-10-CM | POA: Diagnosis not present

## 2014-06-06 DIAGNOSIS — R42 Dizziness and giddiness: Secondary | ICD-10-CM | POA: Diagnosis not present

## 2014-06-06 DIAGNOSIS — R0602 Shortness of breath: Secondary | ICD-10-CM | POA: Diagnosis not present

## 2014-06-06 DIAGNOSIS — R002 Palpitations: Secondary | ICD-10-CM | POA: Diagnosis not present

## 2014-06-09 DIAGNOSIS — G8929 Other chronic pain: Secondary | ICD-10-CM | POA: Diagnosis not present

## 2014-06-09 DIAGNOSIS — F339 Major depressive disorder, recurrent, unspecified: Secondary | ICD-10-CM | POA: Diagnosis not present

## 2014-06-09 DIAGNOSIS — I10 Essential (primary) hypertension: Secondary | ICD-10-CM | POA: Diagnosis not present

## 2014-06-09 DIAGNOSIS — E119 Type 2 diabetes mellitus without complications: Secondary | ICD-10-CM | POA: Diagnosis not present

## 2014-06-09 DIAGNOSIS — I5023 Acute on chronic systolic (congestive) heart failure: Secondary | ICD-10-CM | POA: Diagnosis not present

## 2014-06-09 DIAGNOSIS — F319 Bipolar disorder, unspecified: Secondary | ICD-10-CM | POA: Diagnosis not present

## 2014-06-12 DIAGNOSIS — M7060 Trochanteric bursitis, unspecified hip: Secondary | ICD-10-CM | POA: Diagnosis not present

## 2014-06-12 DIAGNOSIS — M461 Sacroiliitis, not elsewhere classified: Secondary | ICD-10-CM | POA: Diagnosis not present

## 2014-07-05 DIAGNOSIS — I5032 Chronic diastolic (congestive) heart failure: Secondary | ICD-10-CM | POA: Diagnosis not present

## 2014-07-05 DIAGNOSIS — F411 Generalized anxiety disorder: Secondary | ICD-10-CM | POA: Diagnosis not present

## 2014-07-05 DIAGNOSIS — M7072 Other bursitis of hip, left hip: Secondary | ICD-10-CM | POA: Diagnosis not present

## 2014-07-05 DIAGNOSIS — M25552 Pain in left hip: Secondary | ICD-10-CM | POA: Diagnosis not present

## 2014-07-05 DIAGNOSIS — J449 Chronic obstructive pulmonary disease, unspecified: Secondary | ICD-10-CM | POA: Diagnosis not present

## 2014-07-05 DIAGNOSIS — F410 Panic disorder [episodic paroxysmal anxiety] without agoraphobia: Secondary | ICD-10-CM | POA: Diagnosis not present

## 2014-07-12 DIAGNOSIS — N3090 Cystitis, unspecified without hematuria: Secondary | ICD-10-CM | POA: Diagnosis not present

## 2014-07-12 DIAGNOSIS — J069 Acute upper respiratory infection, unspecified: Secondary | ICD-10-CM | POA: Diagnosis not present

## 2014-07-12 DIAGNOSIS — N390 Urinary tract infection, site not specified: Secondary | ICD-10-CM | POA: Diagnosis not present

## 2014-07-12 DIAGNOSIS — F419 Anxiety disorder, unspecified: Secondary | ICD-10-CM | POA: Diagnosis not present

## 2014-07-25 DIAGNOSIS — M461 Sacroiliitis, not elsewhere classified: Secondary | ICD-10-CM | POA: Diagnosis not present

## 2014-07-25 DIAGNOSIS — M7060 Trochanteric bursitis, unspecified hip: Secondary | ICD-10-CM | POA: Diagnosis not present

## 2014-07-26 DIAGNOSIS — B349 Viral infection, unspecified: Secondary | ICD-10-CM | POA: Diagnosis not present

## 2014-08-18 DIAGNOSIS — L659 Nonscarring hair loss, unspecified: Secondary | ICD-10-CM | POA: Diagnosis not present

## 2014-08-18 DIAGNOSIS — F319 Bipolar disorder, unspecified: Secondary | ICD-10-CM | POA: Diagnosis not present

## 2014-08-18 DIAGNOSIS — J40 Bronchitis, not specified as acute or chronic: Secondary | ICD-10-CM | POA: Diagnosis not present

## 2014-08-18 DIAGNOSIS — I509 Heart failure, unspecified: Secondary | ICD-10-CM | POA: Diagnosis not present

## 2014-08-22 DIAGNOSIS — M461 Sacroiliitis, not elsewhere classified: Secondary | ICD-10-CM | POA: Diagnosis not present

## 2014-08-22 DIAGNOSIS — M7060 Trochanteric bursitis, unspecified hip: Secondary | ICD-10-CM | POA: Diagnosis not present

## 2014-08-22 DIAGNOSIS — M47816 Spondylosis without myelopathy or radiculopathy, lumbar region: Secondary | ICD-10-CM | POA: Diagnosis not present

## 2014-08-22 DIAGNOSIS — M791 Myalgia: Secondary | ICD-10-CM | POA: Diagnosis not present

## 2014-08-29 DIAGNOSIS — I5022 Chronic systolic (congestive) heart failure: Secondary | ICD-10-CM | POA: Diagnosis not present

## 2014-08-29 DIAGNOSIS — I5032 Chronic diastolic (congestive) heart failure: Secondary | ICD-10-CM | POA: Diagnosis not present

## 2014-08-29 DIAGNOSIS — I481 Persistent atrial fibrillation: Secondary | ICD-10-CM | POA: Diagnosis not present

## 2014-08-29 DIAGNOSIS — I10 Essential (primary) hypertension: Secondary | ICD-10-CM | POA: Diagnosis not present

## 2014-09-12 DIAGNOSIS — I509 Heart failure, unspecified: Secondary | ICD-10-CM | POA: Diagnosis not present

## 2014-09-12 DIAGNOSIS — I10 Essential (primary) hypertension: Secondary | ICD-10-CM | POA: Diagnosis not present

## 2014-09-12 DIAGNOSIS — E1121 Type 2 diabetes mellitus with diabetic nephropathy: Secondary | ICD-10-CM | POA: Diagnosis not present

## 2014-09-12 DIAGNOSIS — Z0000 Encounter for general adult medical examination without abnormal findings: Secondary | ICD-10-CM | POA: Diagnosis not present

## 2014-09-12 DIAGNOSIS — E785 Hyperlipidemia, unspecified: Secondary | ICD-10-CM | POA: Diagnosis not present

## 2014-09-12 DIAGNOSIS — I5032 Chronic diastolic (congestive) heart failure: Secondary | ICD-10-CM | POA: Diagnosis not present

## 2014-09-18 DIAGNOSIS — I4891 Unspecified atrial fibrillation: Secondary | ICD-10-CM | POA: Diagnosis not present

## 2014-09-18 DIAGNOSIS — R42 Dizziness and giddiness: Secondary | ICD-10-CM | POA: Diagnosis not present

## 2014-09-18 DIAGNOSIS — E1121 Type 2 diabetes mellitus with diabetic nephropathy: Secondary | ICD-10-CM | POA: Diagnosis not present

## 2014-09-18 DIAGNOSIS — D72829 Elevated white blood cell count, unspecified: Secondary | ICD-10-CM | POA: Diagnosis not present

## 2014-09-18 DIAGNOSIS — Z0000 Encounter for general adult medical examination without abnormal findings: Secondary | ICD-10-CM | POA: Diagnosis not present

## 2014-09-21 DIAGNOSIS — M791 Myalgia: Secondary | ICD-10-CM | POA: Diagnosis not present

## 2014-09-21 DIAGNOSIS — M7060 Trochanteric bursitis, unspecified hip: Secondary | ICD-10-CM | POA: Diagnosis not present

## 2014-09-21 DIAGNOSIS — M461 Sacroiliitis, not elsewhere classified: Secondary | ICD-10-CM | POA: Diagnosis not present

## 2014-09-21 DIAGNOSIS — M47816 Spondylosis without myelopathy or radiculopathy, lumbar region: Secondary | ICD-10-CM | POA: Diagnosis not present

## 2014-09-27 DIAGNOSIS — M545 Low back pain: Secondary | ICD-10-CM | POA: Diagnosis not present

## 2014-09-27 DIAGNOSIS — M7072 Other bursitis of hip, left hip: Secondary | ICD-10-CM | POA: Diagnosis not present

## 2014-09-29 DIAGNOSIS — L820 Inflamed seborrheic keratosis: Secondary | ICD-10-CM | POA: Diagnosis not present

## 2014-10-06 DIAGNOSIS — F419 Anxiety disorder, unspecified: Secondary | ICD-10-CM | POA: Diagnosis not present

## 2014-10-06 DIAGNOSIS — L918 Other hypertrophic disorders of the skin: Secondary | ICD-10-CM | POA: Diagnosis not present

## 2014-10-06 DIAGNOSIS — E669 Obesity, unspecified: Secondary | ICD-10-CM | POA: Diagnosis not present

## 2014-10-06 DIAGNOSIS — Z30432 Encounter for removal of intrauterine contraceptive device: Secondary | ICD-10-CM | POA: Diagnosis not present

## 2014-10-06 DIAGNOSIS — I482 Chronic atrial fibrillation: Secondary | ICD-10-CM | POA: Diagnosis not present

## 2014-10-06 DIAGNOSIS — Z01419 Encounter for gynecological examination (general) (routine) without abnormal findings: Secondary | ICD-10-CM | POA: Diagnosis not present

## 2014-10-12 DIAGNOSIS — L820 Inflamed seborrheic keratosis: Secondary | ICD-10-CM | POA: Diagnosis not present

## 2014-10-15 DIAGNOSIS — Z9889 Other specified postprocedural states: Secondary | ICD-10-CM | POA: Diagnosis not present

## 2014-10-15 DIAGNOSIS — F319 Bipolar disorder, unspecified: Secondary | ICD-10-CM | POA: Diagnosis not present

## 2014-10-15 DIAGNOSIS — Z794 Long term (current) use of insulin: Secondary | ICD-10-CM | POA: Diagnosis not present

## 2014-10-15 DIAGNOSIS — Z888 Allergy status to other drugs, medicaments and biological substances status: Secondary | ICD-10-CM | POA: Diagnosis not present

## 2014-10-15 DIAGNOSIS — S63286A Dislocation of proximal interphalangeal joint of right little finger, initial encounter: Secondary | ICD-10-CM | POA: Diagnosis not present

## 2014-10-15 DIAGNOSIS — E785 Hyperlipidemia, unspecified: Secondary | ICD-10-CM | POA: Diagnosis not present

## 2014-10-15 DIAGNOSIS — I10 Essential (primary) hypertension: Secondary | ICD-10-CM | POA: Diagnosis not present

## 2014-10-15 DIAGNOSIS — Z7982 Long term (current) use of aspirin: Secondary | ICD-10-CM | POA: Diagnosis not present

## 2014-10-15 DIAGNOSIS — E669 Obesity, unspecified: Secondary | ICD-10-CM | POA: Diagnosis not present

## 2014-10-15 DIAGNOSIS — M79644 Pain in right finger(s): Secondary | ICD-10-CM | POA: Diagnosis not present

## 2014-10-15 DIAGNOSIS — M549 Dorsalgia, unspecified: Secondary | ICD-10-CM | POA: Diagnosis not present

## 2014-10-15 DIAGNOSIS — G8929 Other chronic pain: Secondary | ICD-10-CM | POA: Diagnosis not present

## 2014-10-15 DIAGNOSIS — I509 Heart failure, unspecified: Secondary | ICD-10-CM | POA: Diagnosis not present

## 2014-10-15 DIAGNOSIS — F419 Anxiety disorder, unspecified: Secondary | ICD-10-CM | POA: Diagnosis not present

## 2014-10-15 DIAGNOSIS — R51 Headache: Secondary | ICD-10-CM | POA: Diagnosis not present

## 2014-10-15 DIAGNOSIS — I4891 Unspecified atrial fibrillation: Secondary | ICD-10-CM | POA: Diagnosis not present

## 2014-10-15 DIAGNOSIS — S62616A Displaced fracture of proximal phalanx of right little finger, initial encounter for closed fracture: Secondary | ICD-10-CM | POA: Diagnosis not present

## 2014-10-15 DIAGNOSIS — S62616D Displaced fracture of proximal phalanx of right little finger, subsequent encounter for fracture with routine healing: Secondary | ICD-10-CM | POA: Diagnosis not present

## 2014-10-15 DIAGNOSIS — S62609A Fracture of unspecified phalanx of unspecified finger, initial encounter for closed fracture: Secondary | ICD-10-CM | POA: Diagnosis not present

## 2014-10-15 DIAGNOSIS — E119 Type 2 diabetes mellitus without complications: Secondary | ICD-10-CM | POA: Diagnosis not present

## 2014-10-18 DIAGNOSIS — S62607A Fracture of unspecified phalanx of left little finger, initial encounter for closed fracture: Secondary | ICD-10-CM | POA: Diagnosis not present

## 2014-10-24 DIAGNOSIS — S62616A Displaced fracture of proximal phalanx of right little finger, initial encounter for closed fracture: Secondary | ICD-10-CM | POA: Diagnosis not present

## 2014-10-24 DIAGNOSIS — S62606A Fracture of unspecified phalanx of right little finger, initial encounter for closed fracture: Secondary | ICD-10-CM | POA: Diagnosis not present

## 2014-10-24 DIAGNOSIS — M79644 Pain in right finger(s): Secondary | ICD-10-CM | POA: Diagnosis not present

## 2014-10-24 DIAGNOSIS — S62607A Fracture of unspecified phalanx of left little finger, initial encounter for closed fracture: Secondary | ICD-10-CM | POA: Diagnosis not present

## 2014-10-25 DIAGNOSIS — B372 Candidiasis of skin and nail: Secondary | ICD-10-CM | POA: Diagnosis not present

## 2014-10-25 DIAGNOSIS — R9431 Abnormal electrocardiogram [ECG] [EKG]: Secondary | ICD-10-CM | POA: Diagnosis not present

## 2014-10-26 DIAGNOSIS — I481 Persistent atrial fibrillation: Secondary | ICD-10-CM | POA: Diagnosis not present

## 2014-10-26 DIAGNOSIS — Z01812 Encounter for preprocedural laboratory examination: Secondary | ICD-10-CM | POA: Diagnosis not present

## 2014-10-26 DIAGNOSIS — I5032 Chronic diastolic (congestive) heart failure: Secondary | ICD-10-CM | POA: Diagnosis not present

## 2014-10-26 DIAGNOSIS — Z01818 Encounter for other preprocedural examination: Secondary | ICD-10-CM | POA: Diagnosis not present

## 2014-10-27 DIAGNOSIS — S62616D Displaced fracture of proximal phalanx of right little finger, subsequent encounter for fracture with routine healing: Secondary | ICD-10-CM | POA: Diagnosis not present

## 2014-10-27 DIAGNOSIS — S62616A Displaced fracture of proximal phalanx of right little finger, initial encounter for closed fracture: Secondary | ICD-10-CM | POA: Diagnosis not present

## 2014-10-27 DIAGNOSIS — Z794 Long term (current) use of insulin: Secondary | ICD-10-CM | POA: Diagnosis not present

## 2014-10-27 DIAGNOSIS — Z7901 Long term (current) use of anticoagulants: Secondary | ICD-10-CM | POA: Diagnosis not present

## 2014-10-27 DIAGNOSIS — E119 Type 2 diabetes mellitus without complications: Secondary | ICD-10-CM | POA: Diagnosis not present

## 2014-11-01 DIAGNOSIS — B372 Candidiasis of skin and nail: Secondary | ICD-10-CM | POA: Diagnosis not present

## 2014-11-01 DIAGNOSIS — N390 Urinary tract infection, site not specified: Secondary | ICD-10-CM | POA: Diagnosis not present

## 2014-11-02 DIAGNOSIS — Z91041 Radiographic dye allergy status: Secondary | ICD-10-CM | POA: Diagnosis not present

## 2014-11-02 DIAGNOSIS — E119 Type 2 diabetes mellitus without complications: Secondary | ICD-10-CM | POA: Diagnosis not present

## 2014-11-02 DIAGNOSIS — G250 Essential tremor: Secondary | ICD-10-CM | POA: Diagnosis not present

## 2014-11-02 DIAGNOSIS — J810 Acute pulmonary edema: Secondary | ICD-10-CM | POA: Diagnosis not present

## 2014-11-02 DIAGNOSIS — R0602 Shortness of breath: Secondary | ICD-10-CM | POA: Diagnosis not present

## 2014-11-02 DIAGNOSIS — M549 Dorsalgia, unspecified: Secondary | ICD-10-CM | POA: Diagnosis not present

## 2014-11-02 DIAGNOSIS — G4733 Obstructive sleep apnea (adult) (pediatric): Secondary | ICD-10-CM | POA: Diagnosis not present

## 2014-11-02 DIAGNOSIS — Z6835 Body mass index (BMI) 35.0-35.9, adult: Secondary | ICD-10-CM | POA: Diagnosis not present

## 2014-11-02 DIAGNOSIS — F419 Anxiety disorder, unspecified: Secondary | ICD-10-CM | POA: Diagnosis not present

## 2014-11-02 DIAGNOSIS — F319 Bipolar disorder, unspecified: Secondary | ICD-10-CM | POA: Diagnosis not present

## 2014-11-02 DIAGNOSIS — R51 Headache: Secondary | ICD-10-CM | POA: Diagnosis not present

## 2014-11-02 DIAGNOSIS — I509 Heart failure, unspecified: Secondary | ICD-10-CM | POA: Diagnosis not present

## 2014-11-02 DIAGNOSIS — G8929 Other chronic pain: Secondary | ICD-10-CM | POA: Diagnosis not present

## 2014-11-02 DIAGNOSIS — E669 Obesity, unspecified: Secondary | ICD-10-CM | POA: Diagnosis not present

## 2014-11-02 DIAGNOSIS — I10 Essential (primary) hypertension: Secondary | ICD-10-CM | POA: Diagnosis not present

## 2014-11-02 DIAGNOSIS — I5031 Acute diastolic (congestive) heart failure: Secondary | ICD-10-CM | POA: Diagnosis not present

## 2014-11-02 DIAGNOSIS — Z794 Long term (current) use of insulin: Secondary | ICD-10-CM | POA: Diagnosis not present

## 2014-11-02 DIAGNOSIS — I482 Chronic atrial fibrillation: Secondary | ICD-10-CM | POA: Diagnosis not present

## 2014-11-02 DIAGNOSIS — J811 Chronic pulmonary edema: Secondary | ICD-10-CM | POA: Diagnosis not present

## 2014-11-02 DIAGNOSIS — J9690 Respiratory failure, unspecified, unspecified whether with hypoxia or hypercapnia: Secondary | ICD-10-CM | POA: Diagnosis not present

## 2014-11-03 DIAGNOSIS — I482 Chronic atrial fibrillation: Secondary | ICD-10-CM | POA: Diagnosis not present

## 2014-11-03 DIAGNOSIS — I4891 Unspecified atrial fibrillation: Secondary | ICD-10-CM | POA: Diagnosis not present

## 2014-11-03 DIAGNOSIS — E119 Type 2 diabetes mellitus without complications: Secondary | ICD-10-CM | POA: Diagnosis not present

## 2014-11-03 DIAGNOSIS — I5031 Acute diastolic (congestive) heart failure: Secondary | ICD-10-CM | POA: Diagnosis not present

## 2014-11-06 DIAGNOSIS — S62616D Displaced fracture of proximal phalanx of right little finger, subsequent encounter for fracture with routine healing: Secondary | ICD-10-CM | POA: Diagnosis not present

## 2014-11-09 DIAGNOSIS — K120 Recurrent oral aphthae: Secondary | ICD-10-CM | POA: Diagnosis not present

## 2014-11-20 DIAGNOSIS — M47816 Spondylosis without myelopathy or radiculopathy, lumbar region: Secondary | ICD-10-CM | POA: Diagnosis not present

## 2014-11-20 DIAGNOSIS — M7060 Trochanteric bursitis, unspecified hip: Secondary | ICD-10-CM | POA: Diagnosis not present

## 2014-11-20 DIAGNOSIS — M791 Myalgia: Secondary | ICD-10-CM | POA: Diagnosis not present

## 2014-11-20 DIAGNOSIS — M461 Sacroiliitis, not elsewhere classified: Secondary | ICD-10-CM | POA: Diagnosis not present

## 2014-11-20 DIAGNOSIS — S62616D Displaced fracture of proximal phalanx of right little finger, subsequent encounter for fracture with routine healing: Secondary | ICD-10-CM | POA: Diagnosis not present

## 2014-11-27 DIAGNOSIS — S62616D Displaced fracture of proximal phalanx of right little finger, subsequent encounter for fracture with routine healing: Secondary | ICD-10-CM | POA: Diagnosis not present

## 2014-12-06 ENCOUNTER — Telehealth: Payer: Self-pay | Admitting: Family Medicine

## 2014-12-07 DIAGNOSIS — S62616A Displaced fracture of proximal phalanx of right little finger, initial encounter for closed fracture: Secondary | ICD-10-CM | POA: Diagnosis not present

## 2014-12-11 NOTE — Telephone Encounter (Signed)
Patient is calling to be seen to establish care. She is on pain medications and does understand that we will refer her to pain management. Patient scheduled appointment on Wednesday @ 9:25 with Tiffany for a rash/itching.

## 2014-12-12 DIAGNOSIS — S62616D Displaced fracture of proximal phalanx of right little finger, subsequent encounter for fracture with routine healing: Secondary | ICD-10-CM | POA: Diagnosis not present

## 2014-12-13 ENCOUNTER — Encounter: Payer: Self-pay | Admitting: Family

## 2014-12-13 ENCOUNTER — Other Ambulatory Visit: Payer: Self-pay | Admitting: *Deleted

## 2014-12-13 ENCOUNTER — Encounter (INDEPENDENT_AMBULATORY_CARE_PROVIDER_SITE_OTHER): Payer: Self-pay

## 2014-12-13 ENCOUNTER — Ambulatory Visit (INDEPENDENT_AMBULATORY_CARE_PROVIDER_SITE_OTHER): Payer: Medicare Other | Admitting: Family

## 2014-12-13 VITALS — BP 115/84 | HR 126 | Temp 98.8°F | Ht 62.0 in | Wt 194.6 lb

## 2014-12-13 DIAGNOSIS — L299 Pruritus, unspecified: Secondary | ICD-10-CM | POA: Diagnosis not present

## 2014-12-13 DIAGNOSIS — L500 Allergic urticaria: Secondary | ICD-10-CM | POA: Diagnosis not present

## 2014-12-13 MED ORDER — METHYLPREDNISOLONE ACETATE 40 MG/ML IJ SUSP
40.0000 mg | Freq: Once | INTRAMUSCULAR | Status: AC
  Administered 2014-12-13: 40 mg via INTRAMUSCULAR

## 2014-12-13 MED ORDER — TRIAMCINOLONE ACETONIDE 0.025 % EX OINT: 1.0000 "application " | TOPICAL_OINTMENT | Freq: Two times a day (BID) | CUTANEOUS | Status: DC

## 2014-12-13 NOTE — Patient Instructions (Signed)
Pruritus  Pruritus is an itch. There are many different problems that can cause an itch. Dry skin is one of the most common causes of itching. Most cases of itching do not require medical attention.  HOME CARE INSTRUCTIONS  Make sure your skin is moistened on a regular basis. A moisturizer that contains petroleum jelly is best for keeping moisture in your skin. If you develop a rash, you may try the following for relief:   Use corticosteroid cream.  Apply cool compresses to the affected areas.  Bathe with Epsom salts or baking soda in the bathwater.  Soak in colloidal oatmeal baths. These are available at your pharmacy.  Apply baking soda paste to the rash. Stir water into baking soda until it reaches a paste-like consistency.  Use an anti-itch lotion.  Take over-the-counter diphenhydramine medicine by mouth as the instructions direct.  Avoid scratching. Scratching may cause the rash to become infected. If itching is very bad, your caregiver may suggest prescription lotions or creams to lessen your symptoms.  Avoid hot showers, which can make itching worse. A cold shower may help with itching as long as you use a moisturizer after the shower. SEEK MEDICAL CARE IF: The itching does not go away after several days. Document Released: 01/29/2011 Document Revised: 10/03/2013 Document Reviewed: 01/29/2011 [facility] Patient Information 2015 [place], [place]. This information is not intended to replace advice given to you by your health care provider. Make sure you discuss any questions you have with your health care provider. Hives Hives are itchy, red, swollen areas of the skin. They can vary in size and location on your body. Hives can come and go for hours or several days (acute hives) or for several weeks (chronic hives). Hives do not spread from person to person (noncontagious). They may get worse with scratching, exercise, and emotional stress. CAUSES   Allergic reaction to food,  additives, or drugs.  Infections, including the common cold.  Illness, such as vasculitis, lupus, or thyroid disease.  Exposure to sunlight, heat, or cold.  Exercise.  Stress.  Contact with chemicals. SYMPTOMS   Red or white swollen patches on the skin. The patches may change size, shape, and location quickly and repeatedly.  Itching.  Swelling of the hands, feet, and face. This may occur if hives develop deeper in the skin. DIAGNOSIS  Your caregiver can usually tell what is wrong by performing a physical exam. Skin or blood tests may also be done to determine the cause of your hives. In some cases, the cause cannot be determined. TREATMENT  Mild cases usually get better with medicines such as antihistamines. Severe cases may require an emergency epinephrine injection. If the cause of your hives is known, treatment includes avoiding that trigger.  HOME CARE INSTRUCTIONS   Avoid causes that trigger your hives.  Take antihistamines as directed by your caregiver to reduce the severity of your hives. Non-sedating or low-sedating antihistamines are usually recommended. Do not drive while taking an antihistamine.  Take any other medicines prescribed for itching as directed by your caregiver.  Wear loose-fitting clothing.  Keep all follow-up appointments as directed by your caregiver. SEEK MEDICAL CARE IF:   You have persistent or severe itching that is not relieved with medicine.  You have painful or swollen joints. SEEK IMMEDIATE MEDICAL CARE IF:   You have a fever.  Your tongue or lips are swollen.  You have trouble breathing or swallowing.  You feel tightness in the throat or chest.  You have  abdominal pain. These problems may be the first sign of a life-threatening allergic reaction. Call your local emergency services (911 in [place] MAKE SURE YOU:   Understand these instructions.  Will watch your condition.  Will get help right away if you are not doing well or  get worse. Document Released: 05/19/2005 Document Revised: 05/24/2013 Document Reviewed: 08/12/2011 [facility] Patient Information 2015 [place], [place]. This information is not intended to replace advice given to you by your health care provider. Make sure you discuss any questions you have with your health care provider.

## 2014-12-13 NOTE — Progress Notes (Signed)
Subjective:    Patient ID: Amber Stephenson, female    DOB: [birth year], 69 y.o.   MRN: [id number]  Pt presents to the office today to establish care. Pt states over the last two months she has been having pruritus and urticaria. Pt states she started moving three months ago from [facility]. Pt states she is allergic to dust, but denies any new contact with any other contact with new chemicals, detergents, or plants.  Urticaria This is a recurrent problem. The current episode started more than 1 month ago (two months). The problem is unchanged. The affected locations include the abdomen, left arm and right arm. The rash is characterized by itchiness and redness. She was exposed to nothing. Associated symptoms include eye pain, fatigue and shortness of breath. Pertinent negatives include no congestion, cough, diarrhea, facial edema, fever, joint pain or sore throat. Past treatments include anti-itch cream. The treatment provided mild relief. There is no history of allergies, asthma or eczema.      Review of Systems  Constitutional: Positive for fatigue. Negative for fever.  HENT: Negative.  Negative for congestion and sore throat.   Eyes: Positive for pain.  Respiratory: Positive for shortness of breath. Negative for cough.   Cardiovascular: Negative.  Negative for palpitations.  Gastrointestinal: Negative.  Negative for diarrhea.  Endocrine: Negative.   Genitourinary: Negative.   Musculoskeletal: Negative.  Negative for joint pain.  Neurological: Negative.  Negative for headaches.  Hematological: Negative.   Psychiatric/Behavioral: Negative.   All other systems reviewed and are negative.      Objective:   Physical Exam  Constitutional: She is oriented to person, place, and time. She appears well-developed and well-nourished. No distress.  HENT:  Head: Normocephalic and atraumatic.  Right Ear: External ear normal.  Left Ear: External ear normal.  Nose: Nose normal.  Mouth/Throat:  Oropharynx is clear and moist.  Eyes: Pupils are equal, round, and reactive to light.  Neck: Normal range of motion. Neck supple. No thyromegaly present.  Cardiovascular: Normal rate, regular rhythm, normal heart sounds and intact distal pulses.   No murmur heard. Pulmonary/Chest: Effort normal and breath sounds normal. No respiratory distress. She has no wheezes.  Abdominal: Soft. Bowel sounds are normal. She exhibits no distension. There is no tenderness.  Musculoskeletal: Normal range of motion. She exhibits no edema or tenderness.  Neurological: She is alert and oriented to person, place, and time. She has normal reflexes. No cranial nerve deficit.  Skin: Skin is warm and dry.  Psychiatric: She has a normal mood and affect. Her behavior is normal. Judgment and thought content normal.  Vitals reviewed.   BP 115/84 mmHg  Pulse 126  Temp(Src) 98.8 F (37.1 C) (Oral)  Ht 5\' 2"  (1.575 m)  Wt 194 lb 9.6 oz (88.27 kg)  BMI 35.58 kg/m2       Assessment & Plan:  1. Pruritus -Do not scratch - methylPREDNISolone acetate (DEPO-MEDROL) injection 40 mg; Inject 1 mL (40 mg total) into the muscle once. - triamcinolone (KENALOG) 0.025 % ointment; Apply 1 application topically 2 (two) times daily.  Dispense: 30 g; Refill: 0  2. Allergic urticaria - methylPREDNISolone acetate (DEPO-MEDROL) injection 40 mg; Inject 1 mL (40 mg total) into the muscle once.  Pt to be on strict low carb diet- DM type 2 Keep diary of when pruritus occurs Maybe related to dust/moving from old home- I hope this discontinues once she settles in her new home -Could be stress related? RTO prn  Alyse Low  Lenna Gilford, [place]

## 2014-12-19 DIAGNOSIS — S62616D Displaced fracture of proximal phalanx of right little finger, subsequent encounter for fracture with routine healing: Secondary | ICD-10-CM | POA: Diagnosis not present

## 2015-01-04 ENCOUNTER — Ambulatory Visit (INDEPENDENT_AMBULATORY_CARE_PROVIDER_SITE_OTHER): Payer: Medicare Other | Admitting: Family Medicine

## 2015-01-04 ENCOUNTER — Telehealth: Payer: Self-pay | Admitting: Family Medicine

## 2015-01-04 ENCOUNTER — Encounter: Payer: Self-pay | Admitting: Family Medicine

## 2015-01-04 VITALS — BP 119/79 | HR 61 | Temp 97.8°F | Ht 62.0 in | Wt 189.0 lb

## 2015-01-04 DIAGNOSIS — E119 Type 2 diabetes mellitus without complications: Secondary | ICD-10-CM | POA: Diagnosis not present

## 2015-01-04 DIAGNOSIS — I482 Chronic atrial fibrillation, unspecified: Secondary | ICD-10-CM

## 2015-01-04 DIAGNOSIS — G8929 Other chronic pain: Secondary | ICD-10-CM

## 2015-01-04 DIAGNOSIS — I10 Essential (primary) hypertension: Secondary | ICD-10-CM | POA: Diagnosis not present

## 2015-01-04 DIAGNOSIS — I4821 Permanent atrial fibrillation: Secondary | ICD-10-CM | POA: Insufficient documentation

## 2015-01-04 DIAGNOSIS — E785 Hyperlipidemia, unspecified: Secondary | ICD-10-CM | POA: Diagnosis not present

## 2015-01-04 DIAGNOSIS — L299 Pruritus, unspecified: Secondary | ICD-10-CM

## 2015-01-04 DIAGNOSIS — M549 Dorsalgia, unspecified: Secondary | ICD-10-CM | POA: Diagnosis not present

## 2015-01-04 DIAGNOSIS — E1165 Type 2 diabetes mellitus with hyperglycemia: Secondary | ICD-10-CM | POA: Insufficient documentation

## 2015-01-04 HISTORY — DX: Chronic atrial fibrillation, unspecified: I48.20

## 2015-01-04 HISTORY — DX: Other chronic pain: G89.29

## 2015-01-04 LAB — POCT UA - MICROALBUMIN: Microalbumin Ur, POC: 20 mg/L

## 2015-01-04 LAB — POCT GLYCOSYLATED HEMOGLOBIN (HGB A1C): Hemoglobin A1C: 7.3

## 2015-01-04 NOTE — Progress Notes (Signed)
HPI  Patient presents today to establish care.  Patient has moved from [facility] about a month ago. She's been seen once in this clinic for itching.  Today she complains of continued itching, she notes diffuse itching worse at night. She is given a shot of Solu-Medrol last visit which helped for about 10 days. She's tried Allegra with no improvement The itching started about a month ago just as she was leaving her old house. She's wondering if it's due to her nerves with changing houses and not being settled yet. She denies rash  Hypertension Does not check at home Takes meds regularly- BB No chest pain, dyspnea, palpitations, or leg edema   A. fib Has been on Eliquis quite some time This has kept her from using epidural injections as a modality for pain treatment of her low back No palpitations, good med compliance  Chronic pain Chronic right-sided low back pain, she's previously treated with chronic narcotics using for 10 mg oxycodone's daily to deal with her pain. Her previous clinic was in [facility] open to being referred to another pain clinic here.  PMH: Smoking status noted ROS: Per HPI  Objective: BP 119/79 mmHg  Pulse 61  Temp(Src) 97.8 F (36.6 C) (Oral)  Ht 5' 2" (1.575 m)  Wt 189 lb (85.73 kg)  BMI 34.56 kg/m2 Gen: NAD, alert, cooperative with exam, difficulty getting up onto the exam table and had a back spasm whenever she got off. HEENT: NCAT, TMs WNL BL, oropharynx clear Neck: Supple no thyromegaly, trachea midline CV: Irregularly irregular, no murmur, normal rate Resp: CTABL, no wheezes, non-labored Ext: No edema, warm Neuro: Alert and oriented, No gross deficits  Assessment and plan:  Hypertension Well controlled Continue Lasix and metoprolol Labs  Hyperlipidemia Labs Watching diet  Diabetes mellitus without complication J1O 7.3 today Continue current treatment-40 units of Lantus nightly, sliding scale NovoLog once daily, and  metformin Labs  Chronic back pain Chronic back pain currently being treated with chronic narcotics from the pain clinic where she previously lived in [place]. She is having new left-sided leg numbness but no left-sided back pain, she has no red flag symptoms Neuro exam alert extremities normal except for altered sensation of left lower extremity No refill narcotics today, refer to pain management    Atrial fibrillation Currently in A. fib with irregularly irregular heart rate Rate controlled on amiodarone Check thyroid function Refer to cardiology given amiodarone treatment  Pruritus Unclear etiology However with bipolar disorder and possibly related to her mood with her major move, consider allergies with new environment as well Improved with steroid's, however I would not like to get another injection today Try dual anti- histamines with Zyrtec and Pepcid Follow up one month    Orders Placed This Encounter  Procedures  . Microalbumin, urine  . CBC with Differential  . CMP14+EGFR  . Lipid Panel  . TSH  . Ambulatory referral to Cardiology    Referral Priority:  Routine    Referral Type:  Consultation    Referral Reason:  Specialty Services Required    Requested Specialty:  Cardiology    Number of Visits Requested:  1  . Ambulatory referral to Pain Clinic    Referral Priority:  Routine    Referral Type:  Consultation    Referral Reason:  Specialty Services Required    Requested Specialty:  Pain Medicine    Number of Visits Requested:  1  . POCT glycosylated hemoglobin (Hb A1C)  .  POCT UA - Microalbumin

## 2015-01-04 NOTE — Patient Instructions (Signed)
Great to meet you!  Lets follow up in 1 month about your itching.   We are working on a cardiology referral and a pain clinic referral  Welcome to the neighborhood!

## 2015-01-04 NOTE — Assessment & Plan Note (Signed)
Currently in A. fib with irregularly irregular heart rate Rate controlled on amiodarone Check thyroid function Refer to cardiology given amiodarone treatment

## 2015-01-04 NOTE — Assessment & Plan Note (Signed)
A1c 7.3 today Continue current treatment-40 units of Lantus nightly, sliding scale NovoLog once daily, and metformin Labs

## 2015-01-04 NOTE — Assessment & Plan Note (Signed)
Well controlled Continue Lasix and metoprolol Labs

## 2015-01-04 NOTE — Assessment & Plan Note (Signed)
Chronic back pain currently being treated with chronic narcotics from the pain clinic where she previously lived in [place]. She is having new left-sided leg numbness but no left-sided back pain, she has no red flag symptoms Neuro exam alert extremities normal except for altered sensation of left lower extremity No refill narcotics today, refer to pain management

## 2015-01-04 NOTE — Telephone Encounter (Signed)
Having an itching problem  Told to take zyrtec -   She is on pepcid as well.  They both have the ingredient famodidine 10 mg  And the zyrtec says not to take more than 10 mg in a 24/hr time She wonders if this is ok?

## 2015-01-04 NOTE — Assessment & Plan Note (Signed)
Labs Watching diet

## 2015-01-04 NOTE — Telephone Encounter (Signed)
   I called and discussed this with her. It sounds like she believes that her Zyrtec is actually Pepcid. I explained that the main ingredient and Zyrtec should be certirizine. She may take 1 Zyrtec daily as well as Famotidine twice daily.  She understands and will review the medication closely when she gets home.  Laroy Apple, MD [facility] 01/04/2015, 6:15 PM

## 2015-01-04 NOTE — Assessment & Plan Note (Signed)
Unclear etiology However with bipolar disorder and possibly related to her mood with her major move, consider allergies with new environment as well Improved with steroid's, however I would not like to get another injection today Try dual anti- histamines with Zyrtec and Pepcid Follow up one month

## 2015-01-05 ENCOUNTER — Telehealth: Payer: Self-pay | Admitting: Family Medicine

## 2015-01-05 DIAGNOSIS — E1122 Type 2 diabetes mellitus with diabetic chronic kidney disease: Secondary | ICD-10-CM | POA: Insufficient documentation

## 2015-01-05 DIAGNOSIS — N183 Chronic kidney disease, stage 3 unspecified: Secondary | ICD-10-CM | POA: Insufficient documentation

## 2015-01-05 LAB — CBC WITH DIFFERENTIAL/PLATELET
Basophils Absolute: 0 10*3/uL (ref 0.0–0.2)
Basos: 1 %
EOS (ABSOLUTE): 0.3 10*3/uL (ref 0.0–0.4)
Eos: 4 %
Hematocrit: 38.5 % (ref 34.0–46.6)
Hemoglobin: 12.4 g/dL (ref 11.1–15.9)
Immature Grans (Abs): 0 10*3/uL (ref 0.0–0.1)
Immature Granulocytes: 0 %
Lymphocytes Absolute: 2.7 10*3/uL (ref 0.7–3.1)
Lymphs: 34 %
MCH: 26.4 pg — ABNORMAL LOW (ref 26.6–33.0)
MCHC: 32.2 g/dL (ref 31.5–35.7)
MCV: 82 fL (ref 79–97)
Monocytes Absolute: 0.6 10*3/uL (ref 0.1–0.9)
Monocytes: 7 %
Neutrophils Absolute: 4.3 10*3/uL (ref 1.4–7.0)
Neutrophils: 54 %
Platelets: 268 10*3/uL (ref 150–379)
RBC: 4.7 x10E6/uL (ref 3.77–5.28)
RDW: 16.6 % — ABNORMAL HIGH (ref 12.3–15.4)
WBC: 7.9 10*3/uL (ref 3.4–10.8)

## 2015-01-05 LAB — MICROALBUMIN, URINE: Microalbumin, Urine: 31.8 ug/mL

## 2015-01-05 LAB — CMP14+EGFR
ALT: 11 IU/L (ref 0–32)
AST: 12 IU/L (ref 0–40)
Albumin/Globulin Ratio: 1.7 (ref 1.1–2.5)
Albumin: 4.5 g/dL (ref 3.6–4.8)
Alkaline Phosphatase: 56 IU/L (ref 39–117)
BUN/Creatinine Ratio: 16 (ref 11–26)
BUN: 24 mg/dL (ref 8–27)
Bilirubin Total: 0.3 mg/dL (ref 0.0–1.2)
CO2: 23 mmol/L (ref 18–29)
Calcium: 10 mg/dL (ref 8.7–10.3)
Chloride: 96 mmol/L — ABNORMAL LOW (ref 97–108)
Creatinine, Ser: 1.53 mg/dL — ABNORMAL HIGH (ref 0.57–1.00)
GFR calc Af Amer: 40 mL/min/{1.73_m2} — ABNORMAL LOW (ref >59)
GFR calc non Af Amer: 35 mL/min/{1.73_m2} — ABNORMAL LOW (ref >59)
Globulin, Total: 2.7 g/dL (ref 1.5–4.5)
Glucose: 180 mg/dL — ABNORMAL HIGH (ref 65–99)
Potassium: 4.4 mmol/L (ref 3.5–5.2)
Sodium: 137 mmol/L (ref 134–144)
Total Protein: 7.2 g/dL (ref 6.0–8.5)

## 2015-01-05 LAB — TSH: TSH: 3.55 u[IU]/mL (ref 0.450–4.500)

## 2015-01-05 LAB — LIPID PANEL
Chol/HDL Ratio: 3.9 ratio units (ref 0.0–4.4)
Cholesterol, Total: 163 mg/dL (ref 100–199)
HDL: 42 mg/dL (ref >39)
LDL Calculated: 71 mg/dL (ref 0–99)
Triglycerides: 249 mg/dL — ABNORMAL HIGH (ref 0–149)
VLDL Cholesterol Cal: 50 mg/dL — ABNORMAL HIGH (ref 5–40)

## 2015-01-05 MED ORDER — LISINOPRIL 2.5 MG PO TABS: 2.5000 mg | ORAL_TABLET | Freq: Every day | ORAL | Status: DC

## 2015-01-05 NOTE — Assessment & Plan Note (Signed)
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

## 2015-01-05 NOTE — Addendum Note (Signed)
Addended by: Timmothy Euler on: 01/05/2015 11:54 AM   Modules accepted: Orders

## 2015-01-05 NOTE — Telephone Encounter (Signed)
Called and discussed labs, everything is good except for GFR of 35.   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   Her itching is improved.   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

## 2015-01-09 ENCOUNTER — Telehealth: Payer: Self-pay | Admitting: Family Medicine

## 2015-01-09 DIAGNOSIS — M549 Dorsalgia, unspecified: Principal | ICD-10-CM

## 2015-01-09 DIAGNOSIS — G8929 Other chronic pain: Secondary | ICD-10-CM

## 2015-01-09 NOTE — Telephone Encounter (Signed)
I attempted to call her, busy both times.   Will attempt Thursday.   Laroy Apple, MD [facility] 01/09/2015, 5:39 PM

## 2015-01-09 NOTE — Telephone Encounter (Signed)
Informed pt of her appt date & time w/ Dr. Lowanda Foster and she asked to make an appt sooner than a month with Dr. Wendi Snipes. Appt was made for 2 weeks from now. Pt is wanting to see if Dr. Wendi Snipes could still call her. Informed pt that she may not get a return call till he returns on Thursday, which she is ok with

## 2015-01-11 MED ORDER — TRAMADOL HCL 50 MG PO TABS: 50.0000 mg | ORAL_TABLET | Freq: Three times a day (TID) | ORAL | Status: DC | PRN

## 2015-01-11 NOTE — Telephone Encounter (Signed)
Called and discussed her questions. Hs ewas wondering if itching was due to renal disease. I told probably not as her BUN was normal.  She is out of percocet and asked for tramadol- I agree, i think since she isnt able to takle her NSAID and she is out of narcotic this is a very good alternative.   Rx given to place up front for pick up.   Laroy Apple, MD [facility] 01/11/2015, 1:04 PM

## 2015-01-11 NOTE — Telephone Encounter (Signed)
Attempted call again, busy.   Laroy Apple, MD [facility] 01/11/2015, 7:49 AM

## 2015-01-16 ENCOUNTER — Other Ambulatory Visit: Payer: Self-pay

## 2015-01-16 MED ORDER — TIZANIDINE HCL 4 MG PO TABS: 4.0000 mg | ORAL_TABLET | Freq: Two times a day (BID) | ORAL | Status: DC

## 2015-01-23 ENCOUNTER — Ambulatory Visit (INDEPENDENT_AMBULATORY_CARE_PROVIDER_SITE_OTHER): Payer: Medicare Other | Admitting: Family Medicine

## 2015-01-23 ENCOUNTER — Encounter: Payer: Self-pay | Admitting: Family Medicine

## 2015-01-23 ENCOUNTER — Telehealth: Payer: Self-pay

## 2015-01-23 VITALS — BP 116/78 | HR 67 | Temp 96.8°F | Ht 62.0 in | Wt 188.0 lb

## 2015-01-23 DIAGNOSIS — F319 Bipolar disorder, unspecified: Secondary | ICD-10-CM | POA: Insufficient documentation

## 2015-01-23 DIAGNOSIS — G4700 Insomnia, unspecified: Secondary | ICD-10-CM | POA: Diagnosis not present

## 2015-01-23 DIAGNOSIS — N183 Chronic kidney disease, stage 3 unspecified: Secondary | ICD-10-CM

## 2015-01-23 DIAGNOSIS — F3170 Bipolar disorder, currently in remission, most recent episode unspecified: Secondary | ICD-10-CM | POA: Diagnosis not present

## 2015-01-23 DIAGNOSIS — I482 Chronic atrial fibrillation, unspecified: Secondary | ICD-10-CM

## 2015-01-23 DIAGNOSIS — E1122 Type 2 diabetes mellitus with diabetic chronic kidney disease: Secondary | ICD-10-CM | POA: Diagnosis not present

## 2015-01-23 HISTORY — DX: Bipolar disorder, unspecified: F31.9

## 2015-01-23 HISTORY — DX: Insomnia, unspecified: G47.00

## 2015-01-23 MED ORDER — ZALEPLON 10 MG PO CAPS: 20.0000 mg | ORAL_CAPSULE | Freq: Every evening | ORAL | Status: DC | PRN

## 2015-01-23 NOTE — Assessment & Plan Note (Signed)
Rate controlled on metop Continue eliquis Previously I thought she was on amio, she states she went through her meds and she is not.  Has cardiology appt.

## 2015-01-23 NOTE — Progress Notes (Signed)
Patient ID: Amber Stephenson, female   DOB: 1946-01-09, 69 y.o.   MRN: [id number]   HPI  Patient presents today for evaluation of chronic medical problems  and to discuss chronic kidney disease  Kidney disease Has started ACE inhibitor, tolerating well Has stopped NSAIDs completely Getting clear fluids Previously she was taking 800 mg of Motrin 3 times daily She has several questions which we discussed, including Lasix, potassium balance, and normal course of kidney disease Awaiting renal referral  Insomnia Has tried several medications previously but was started on Sonata by her previous psychiatrist. She states she takes 20 mg every night, Her sleep is still not perfect and she states she only gets a few hours of sleep every night which is interrupted by several periods of awakening. Request refill today  Pain Has chronic pain syndrome, tramadol helping but not nearly as effective as the oxycodone was, she is awaiting referral to pain clinic  Bipolar disorder Has been on Abilify for quite some time and is well controlled. Denies any episodes of mania or depression.  No chest pain, palpitations, tachycardia, dyspnea.  PMH: Smoking status noted ROS: Per HPI, otherwise negative Objective: BP 116/78 mmHg  Pulse 67  Temp(Src) 96.8 F (36 C) (Oral)  Ht 5' 2" (1.575 m)  Wt 188 lb (85.276 kg)  BMI 34.38 kg/m2 Gen: NAD, alert, cooperative with exam HEENT: NCAT CV: RRR, good S1/S2, no murmur Resp: CTABL, no wheezes, non-labored Ext: No edema, warm Neuro: Alert and oriented, No gross deficits  Assessment and plan:  CKD stage 3 due to type 2 diabetes mellitus Discussed disease process Avoid NSAIds and drink plenty of fluids Ace inhibitor started, repeat labs in 2 weeks NSAID vs diabetic nephropathy   Insomnia Sonata strated by her previous psychiatrist, requests refill Working well Given 6 months Discussed requesting records, patient will, previous military  psych   Bipolar disorder Doing well on abilify No chnages, consider psych referral  Atrial fibrillation Rate controlled on metop Continue eliquis Previously I thought she was on amio, she states she went through her meds and she is not.  Has cardiology appt.     Orders Placed This Encounter  Procedures  . BMP8+EGFR    Standing Status: Future     Number of Occurrences:      Standing Expiration Date: 01/23/2016    Meds ordered this encounter  Medications  . zaleplon (SONATA) 10 MG capsule    Sig: Take 2 capsules (20 mg total) by mouth at bedtime as needed for sleep.    Dispense:  180 capsule    Refill:  McCoole, MD [facility] 01/23/2015, 9:38 AM

## 2015-01-23 NOTE — Telephone Encounter (Signed)
Pt aware of her apt for her nephrology referral on 9/26.

## 2015-01-23 NOTE — Assessment & Plan Note (Signed)
Sonata strated by her previous psychiatrist, requests refill Working well Given 6 months Discussed requesting records, patient will, previous Engineer, technical sales

## 2015-01-23 NOTE — Patient Instructions (Signed)
Great to see you!  You should be hearing about the referrals soon (pain clinic and kidney doctors)  Come back in 2 weeks or so to get your blood work drawn (repeat kidney function after starting lisinopril)

## 2015-01-23 NOTE — Assessment & Plan Note (Signed)
Discussed disease process Avoid NSAIds and drink plenty of fluids Ace inhibitor started, repeat labs in 2 weeks NSAID vs diabetic nephropathy

## 2015-01-23 NOTE — Assessment & Plan Note (Signed)
Doing well on abilify No chnages, consider psych referral

## 2015-01-24 ENCOUNTER — Telehealth: Payer: Self-pay | Admitting: Family Medicine

## 2015-01-25 ENCOUNTER — Other Ambulatory Visit: Payer: Self-pay

## 2015-01-25 MED ORDER — RIZATRIPTAN BENZOATE 10 MG PO TBDP: ORAL_TABLET | ORAL | Status: DC

## 2015-01-25 NOTE — Telephone Encounter (Signed)
Last seen 01/23/15  Dr Wendi Snipes

## 2015-02-06 ENCOUNTER — Ambulatory Visit: Payer: Medicare Other | Admitting: Family Medicine

## 2015-02-07 ENCOUNTER — Encounter: Payer: Self-pay | Admitting: Cardiology

## 2015-02-07 ENCOUNTER — Ambulatory Visit (INDEPENDENT_AMBULATORY_CARE_PROVIDER_SITE_OTHER): Payer: Medicare Other | Admitting: Cardiology

## 2015-02-07 VITALS — BP 124/97 | HR 108 | Ht 62.0 in | Wt 191.0 lb

## 2015-02-07 DIAGNOSIS — I10 Essential (primary) hypertension: Secondary | ICD-10-CM

## 2015-02-07 DIAGNOSIS — I4891 Unspecified atrial fibrillation: Secondary | ICD-10-CM | POA: Diagnosis not present

## 2015-02-07 NOTE — Patient Instructions (Signed)
Medication Instructions:  The current medical regimen is effective;  continue present plan and medications.  Testing/Procedures: Your physician has requested that you have an echocardiogram. Echocardiography is a painless test that uses sound waves to create images of your heart. It provides your doctor with information about the size and shape of your heart and how well your heart's chambers and valves are working. This procedure takes approximately one hour. There are no restrictions for this procedure.  Your physician has recommended that you wear a holter monitor. Holter monitors are medical devices that record the heart's electrical activity. Doctors most often use these monitors to diagnose arrhythmias. Arrhythmias are problems with the speed or rhythm of the heartbeat. The monitor is a small, portable device. You can wear one while you do your normal daily activities. This is usually used to diagnose what is causing palpitations/syncope (passing out).   Follow-Up: Follow up in 1 month with Dr Percival Spanish in [place].  Thank you for choosing [facility]!!

## 2015-02-07 NOTE — Progress Notes (Signed)
Cardiology Office Note   Date:  02/07/2015   ID:  Amber Stephenson, DOB 1945-11-08, MRN [id number]  PCP:  Kenn File, MD  Cardiologist:   Minus Breeding, MD   Chief Complaint  Patient presents with  . Atrial Fibrillation      History of Present Illness: Amber Stephenson is a 69 y.o. female who presents for evaluation of atrial fibrillation. She has moved here from [place] and this is her first visit with me. I don't have any old records. I do see that she is in persistent atrial fibrillation. She said this started about a year ago. She apparently presented to hospital with shortness of breath. She may have had pneumonia. She describes heart failure but doesn't know what her ejection fraction was. She was apparently in fibrillation and it sounds like she had cardioversion but didn't maintain sinus rhythm. They have obviously pursue rate control and anticoagulation. She is moving here. She's had no acute complaints although she is very limited in activities because of deconditioning, breathing, back and joint problems. She does have dyspnea with exertion but this has been chronic. She might get short of breath walking across her house. She does sleep chronically on 3 pillows. She is however not describing PND or orthopnea. She has not had weight gain or edema. She sometimes feels her heart racing but she hasn't had any syncope. She sometimes gets some dizziness but she is not describing orthostatic symptoms. She's had no edema and actually has lost 30 pounds over a year by eating less.   Past Medical History  Diagnosis Date  . Diabetes mellitus without complication   . Hyperlipidemia   . Hypertension   . Bipolar affective   . Atrial fibrillation     Past Surgical History  Procedure Laterality Date  . Thigh surgery    . Shoulder surgery Right   . Breast reduction surgery       Current Outpatient Prescriptions  Medication Sig Dispense Refill  . apixaban (ELIQUIS) 5 MG TABS  tablet Take 5 mg by mouth 2 (two) times daily.    . ARIPiprazole (ABILIFY) 15 MG tablet Take 15 mg by mouth daily.    Marland Kitchen atorvastatin (LIPITOR) 40 MG tablet Take 40 mg by mouth daily.    . Cholecalciferol (VITAMIN D3) 1000 UNITS CAPS Take 1,000 Units by mouth daily.    . fenofibrate (TRICOR) 145 MG tablet Take 145 mg by mouth daily.    . furosemide (LASIX) 20 MG tablet Take 20 mg by mouth 2 (two) times daily.    . insulin glargine (LANTUS) 100 UNIT/ML injection Inject 40 Units into the skin at bedtime.    . Insulin NPH Human, Isophane, (NOVOLIN N [place]) Inject 23 Units into the skin every morning.     . lidocaine (LIDODERM) 5 % Place 1 patch onto the skin 2 (two) times daily.    Marland Kitchen lisinopril (ZESTRIL) 2.5 MG tablet Take 1 tablet (2.5 mg total) by mouth daily. 30 tablet 3  . metFORMIN (GLUCOPHAGE) 1000 MG tablet Take 1,000 mg by mouth 2 (two) times daily with a meal.    . METOPROLOL TARTRATE PO Take 100 mg by mouth 2 (two) times daily.    . potassium chloride (K-DUR,KLOR-CON) 10 MEQ tablet TAKE 1 TABLET 2 TIMES DAILY WITH FOOD  0  . pregabalin (LYRICA) 50 MG capsule Take 50 mg by mouth daily.    . rizatriptan (MAXALT-MLT) 10 MG disintegrating tablet DISSOLVE 1 TABLET SUBLINGUALLY IMMEDIATELY, MAY REPEAT IN 1  HOUR IF NEEDED (MAX OF 3 TABS/24 HOURS) 10 tablet 3  . SUMAtriptan (IMITREX) 100 MG tablet Take 100 mg by mouth See admin instructions.  1  . tiZANidine (ZANAFLEX) 4 MG tablet Take 1 tablet (4 mg total) by mouth 2 (two) times daily. 30 tablet 2   No current facility-administered medications for this visit.    Allergies:   Ivp dye    Social History:  The patient  reports that she has never smoked. She does not have any smokeless tobacco history on file. She reports that she does not drink alcohol or use illicit drugs.   Family History:  The patient's family history includes Diabetes in her mother; Heart disease in her brother, mother, and sister.   her sister apparently had bypass but she is  not close to them to understand their medical history   ROS:  Please see the history of present illness.   Otherwise, review of systems are positive for positive for joint and back pain.   All other systems are reviewed and negative.    PHYSICAL EXAM: VS:  BP 124/97 mmHg  Pulse 108  Ht 5\' 2"  (1.575 m)  Wt 191 lb (86.637 kg)  BMI 34.93 kg/m2  SpO2 98% , BMI Body mass index is 34.93 kg/(m^2). GENERAL:  Well appearing HEENT:  Pupils equal round and reactive, fundi not visualized, oral mucosa unremarkable NECK:  No jugular venous distention, waveform within normal limits, carotid upstroke brisk and symmetric, no bruits, no thyromegaly LYMPHATICS:  No cervical, inguinal adenopathy LUNGS:  Clear to auscultation bilaterally BACK:  No CVA tenderness CHEST:  Unremarkable HEART:  PMI not displaced or sustained,S1 and S2 within normal limits, no S3, no clicks, no rubs, no murmurs, irregular ABD:  Flat, positive bowel sounds normal in frequency in pitch, no bruits, no rebound, no guarding, no midline pulsatile mass, no hepatomegaly, no splenomegaly EXT:  2 plus pulses throughout, no edema, no cyanosis no clubbing SKIN:  No rashes no nodules NEURO:  Cranial nerves II through XII grossly intact, motor grossly intact throughout PSYCH:  Cognitively intact, oriented to person place and time    EKG:  EKG is ordered today. The ekg ordered today demonstrates atrial fibrillation, rate 100, axis within normal limits, nonspecific anterior T-wave changes, lateral T-wave inversions consistent with possible ischemia. No old EKGs for comparison   Recent Labs: 01/04/2015: ALT 11; BUN 24; Creatinine, Ser 1.53*; Potassium 4.4; Sodium 137; TSH 3.550    Lipid Panel    Component Value Date/Time   CHOL 163 01/04/2015 1028   TRIG 249* 01/04/2015 1028   HDL 42 01/04/2015 1028   CHOLHDL 3.9 01/04/2015 1028   LDLCALC 71 01/04/2015 1028      Wt Readings from Last 3 Encounters:  02/07/15 191 lb (86.637 kg)    01/23/15 188 lb (85.276 kg)  01/04/15 189 lb (85.73 kg)      Other studies Reviewed: Additional studies/ records that were reviewed today include: Labs. Review of the above records demonstrates:  Please see elsewhere in the note.     ASSESSMENT AND PLAN:  ATRIAL FIB:  This is apparently permanent. After careful discussion it appears that her cardiologist made the decision to manage her with rate control and anticoagulation. I will pursue this and I will order a 24-hour Holter to make sure she has adequate rate control. She tolerates the current blood thinners. I will check an echocardiogram.  Ms. Melanny Wire has a CHA2DS2 - VASc score of 3 with a  risk of stroke of 3.2%.   CHRONIC DIASTOLIC HF: She seems to be euvolemic. I don't know her ejection fraction but I suspect it was preserved. I will follow-up with an echocardiogram. She does have renal insufficiency and we talked about fluid management. Now she will remain on the meds as listed.  CKD:  She has chronic kidney disease probably related to hypertension. This will be followed closely as she is now on an ACE inhibitor.  HTN:  This is reported on her problem list although she reports is not typically benign. She will remain on the meds as listed    Current medicines are reviewed at length with the patient today.  The patient does not have concerns regarding medicines.  The following changes have been made:  no change  Labs/ tests ordered today include:   Orders Placed This Encounter  Procedures  . EKG 12-Lead HOLTER ECHO     Disposition:   FU with me in one month.     Signed, Minus Breeding, MD  02/07/2015 12:18 PM    [facility]

## 2015-02-08 ENCOUNTER — Ambulatory Visit (INDEPENDENT_AMBULATORY_CARE_PROVIDER_SITE_OTHER): Payer: Medicare Other | Admitting: Family Medicine

## 2015-02-08 ENCOUNTER — Encounter: Payer: Self-pay | Admitting: Family Medicine

## 2015-02-08 VITALS — BP 127/86 | HR 63 | Temp 97.4°F | Ht 62.0 in | Wt 189.8 lb

## 2015-02-08 DIAGNOSIS — E1122 Type 2 diabetes mellitus with diabetic chronic kidney disease: Secondary | ICD-10-CM

## 2015-02-08 DIAGNOSIS — M549 Dorsalgia, unspecified: Secondary | ICD-10-CM

## 2015-02-08 DIAGNOSIS — D179 Benign lipomatous neoplasm, unspecified: Secondary | ICD-10-CM | POA: Insufficient documentation

## 2015-02-08 DIAGNOSIS — F3174 Bipolar disorder, in full remission, most recent episode manic: Secondary | ICD-10-CM

## 2015-02-08 DIAGNOSIS — G8929 Other chronic pain: Secondary | ICD-10-CM | POA: Diagnosis not present

## 2015-02-08 DIAGNOSIS — N183 Chronic kidney disease, stage 3 (moderate): Secondary | ICD-10-CM

## 2015-02-08 HISTORY — DX: Benign lipomatous neoplasm, unspecified: D17.9

## 2015-02-08 MED ORDER — DIAZEPAM 5 MG PO TABS: 5.0000 mg | ORAL_TABLET | Freq: Two times a day (BID) | ORAL | Status: DC | PRN

## 2015-02-08 MED ORDER — PREGABALIN 50 MG PO CAPS: 50.0000 mg | ORAL_CAPSULE | Freq: Two times a day (BID) | ORAL | Status: DC

## 2015-02-08 MED ORDER — LIDOCAINE 5 % EX PTCH: 1.0000 | MEDICATED_PATCH | Freq: Two times a day (BID) | CUTANEOUS | Status: DC

## 2015-02-08 MED ORDER — TRAMADOL HCL 50 MG PO TABS: 50.0000 mg | ORAL_TABLET | Freq: Four times a day (QID) | ORAL | Status: DC | PRN

## 2015-02-08 MED ORDER — ATORVASTATIN CALCIUM 40 MG PO TABS: 40.0000 mg | ORAL_TABLET | Freq: Every day | ORAL | Status: DC

## 2015-02-08 NOTE — Addendum Note (Signed)
Addended by: Timmothy Euler on: 02/08/2015 12:32 PM   Modules accepted: Orders

## 2015-02-08 NOTE — Progress Notes (Signed)
HPI  Patient presents today for evaluation of bipolar disorder and for refills  Bipolar disorder The patient feels that she is acutely manic. She has not slept in 4 days. She describes racing thoughts, easy agitation She has not established care with a psychiatrist here. She has used Abilify for several years safely. She has used Resporal with side effect of "feeling crazy".  Dyspnea Exertional dyspnea, she is having echocardiogram scheduled by the cardiologist.  Needs refills on Lipitor, tramadol, Lyrica, and Lidoderm patches  She is waiting on a pain management referral, she was previously treated with oxycodone. She's getting some pain relief but not complete pain relief, from tramadol.  PMH: Smoking status noted ROS: Per HPI  Objective: BP 127/86 mmHg  Pulse 63  Temp(Src) 97.4 F (36.3 C) (Oral)  Ht 5\' 2"  (1.575 m)  Wt 189 lb 12.8 oz (86.093 kg)  BMI 34.71 kg/m2 Gen: NAD, alert, cooperative with exam HEENT: NCAT CV: RRR, good S1/S2, no murmur Resp: CTABL, no wheezes, non-labored Ext: No edema, warm Neuro: Alert and oriented, No gross deficits  psych: Pressured speech and affect, denies suicidality  Assessment and plan:  # bipolar disorder, acute mania  she appears to be acutely manic, she denies suicidality or homicidality.  she is not a danger to herself, do not feel she needs to be hospitalized at this time. Increase Abilify to 30 mg 10 mg of Valium today, 5 mg as needed for the next few days, given 15 pills   close follow-up as needed and very low threshold for emergency medical care reviewed which she understands completely.   #  CKG stage III Avoiding NSAIDs now, recheck labs Continue lisinopril   Chronic pain   awaiting pain management appointment Refill Lyrica, lidocaine patch, tramadol Caution with tramadol and Abilify, however I do believe this is a better alternative than oxycodone unless she is supervised by pain specialist.     Orders  Placed This Encounter  Procedures  . Ambulatory referral to Psychiatry    Referral Priority:  Urgent    Referral Type:  Psychiatric    Referral Reason:  Specialty Services Required    Requested Specialty:  Psychiatry    Number of Visits Requested:  1  . Ambulatory referral to General Surgery    Referral Priority:  Routine    Referral Type:  Surgical    Referral Reason:  Specialty Services Required    Requested Specialty:  General Surgery    Number of Visits Requested:  1    Meds ordered this encounter  Medications  . DISCONTD: traMADol (ULTRAM) 50 MG tablet    Sig: TAKE 1 TO 2 TABLETS EVERY 8 HOURS AS NEEDED    Refill:  0  . diazepam (VALIUM) 5 MG tablet    Sig: Take 1 tablet (5 mg total) by mouth every 12 (twelve) hours as needed for anxiety.    Dispense:  15 tablet    Refill:  0  . traMADol (ULTRAM) 50 MG tablet    Sig: Take 1 tablet (50 mg total) by mouth every 6 (six) hours as needed for severe pain.    Dispense:  60 tablet    Refill:  2  . pregabalin (LYRICA) 50 MG capsule    Sig: Take 1 capsule (50 mg total) by mouth 2 (two) times daily.    Dispense:  60 capsule    Refill:  3  . lidocaine (LIDODERM) 5 %    Sig: Place 1 patch onto the skin  2 (two) times daily.    Dispense:  30 patch    Refill:  5  . atorvastatin (LIPITOR) 40 MG tablet    Sig: Take 1 tablet (40 mg total) by mouth daily.    Dispense:  90 tablet    Refill:  Fisher, MD [facility] 02/08/2015, 11:29 AM

## 2015-02-08 NOTE — Patient Instructions (Addendum)
Great to see you again  We are making calls about psychiatry  Try the valium today but do not drive afterwards Increase abilify to two pills a day until you get past this   Please go to the emergency room if you do not get relief from our changes, if you have suicidal thoughts, or if you have worsening symptoms

## 2015-02-09 LAB — BMP8+EGFR
BUN/Creatinine Ratio: 16 (ref 11–26)
BUN: 18 mg/dL (ref 8–27)
CO2: 24 mmol/L (ref 18–29)
Calcium: 9.9 mg/dL (ref 8.7–10.3)
Chloride: 103 mmol/L (ref 97–108)
Creatinine, Ser: 1.16 mg/dL — ABNORMAL HIGH (ref 0.57–1.00)
GFR calc Af Amer: 56 mL/min/{1.73_m2} — ABNORMAL LOW (ref >59)
GFR calc non Af Amer: 49 mL/min/{1.73_m2} — ABNORMAL LOW (ref >59)
Glucose: 143 mg/dL — ABNORMAL HIGH (ref 65–99)
Potassium: 5.1 mmol/L (ref 3.5–5.2)
Sodium: 143 mmol/L (ref 134–144)

## 2015-02-13 DIAGNOSIS — M7062 Trochanteric bursitis, left hip: Secondary | ICD-10-CM | POA: Diagnosis not present

## 2015-02-14 ENCOUNTER — Ambulatory Visit (INDEPENDENT_AMBULATORY_CARE_PROVIDER_SITE_OTHER): Payer: Medicare Other

## 2015-02-14 ENCOUNTER — Other Ambulatory Visit: Payer: Self-pay

## 2015-02-14 ENCOUNTER — Ambulatory Visit (HOSPITAL_COMMUNITY): Payer: Medicare Other | Attending: Cardiology

## 2015-02-14 DIAGNOSIS — I4891 Unspecified atrial fibrillation: Secondary | ICD-10-CM | POA: Insufficient documentation

## 2015-02-14 DIAGNOSIS — I10 Essential (primary) hypertension: Secondary | ICD-10-CM

## 2015-02-14 DIAGNOSIS — I313 Pericardial effusion (noninflammatory): Secondary | ICD-10-CM | POA: Diagnosis not present

## 2015-02-14 DIAGNOSIS — I340 Nonrheumatic mitral (valve) insufficiency: Secondary | ICD-10-CM | POA: Diagnosis not present

## 2015-02-14 DIAGNOSIS — I517 Cardiomegaly: Secondary | ICD-10-CM | POA: Insufficient documentation

## 2015-02-15 DIAGNOSIS — I4891 Unspecified atrial fibrillation: Secondary | ICD-10-CM | POA: Diagnosis not present

## 2015-02-15 DIAGNOSIS — I129 Hypertensive chronic kidney disease with stage 1 through stage 4 chronic kidney disease, or unspecified chronic kidney disease: Secondary | ICD-10-CM | POA: Diagnosis not present

## 2015-02-15 DIAGNOSIS — E119 Type 2 diabetes mellitus without complications: Secondary | ICD-10-CM | POA: Diagnosis not present

## 2015-02-15 DIAGNOSIS — N183 Chronic kidney disease, stage 3 (moderate): Secondary | ICD-10-CM | POA: Diagnosis not present

## 2015-02-15 DIAGNOSIS — Z794 Long term (current) use of insulin: Secondary | ICD-10-CM | POA: Diagnosis not present

## 2015-02-15 DIAGNOSIS — I10 Essential (primary) hypertension: Secondary | ICD-10-CM | POA: Diagnosis not present

## 2015-02-15 DIAGNOSIS — F3160 Bipolar disorder, current episode mixed, unspecified: Secondary | ICD-10-CM | POA: Diagnosis not present

## 2015-02-15 DIAGNOSIS — D1724 Benign lipomatous neoplasm of skin and subcutaneous tissue of left leg: Secondary | ICD-10-CM | POA: Diagnosis not present

## 2015-02-16 ENCOUNTER — Telehealth: Payer: Self-pay | Admitting: Cardiology

## 2015-02-16 NOTE — Telephone Encounter (Signed)
Received records from [facility] for appointment on 03/07/15 with Dr Percival Spanish.  Records given to [facility] (medical records) for Dr Hochrein's schedule on 03/07/15.  lp

## 2015-02-23 ENCOUNTER — Other Ambulatory Visit: Payer: Self-pay | Admitting: Family Medicine

## 2015-02-23 MED ORDER — TIZANIDINE HCL 4 MG PO TABS: 4.0000 mg | ORAL_TABLET | Freq: Two times a day (BID) | ORAL | Status: DC

## 2015-02-23 NOTE — Telephone Encounter (Signed)
Patient notified that medication was sent to pharmacy.

## 2015-02-23 NOTE — Telephone Encounter (Signed)
Patient given tizanidine #30 on 8/16 with 1 refill. She has filled both of these prescriptions and requesting a refill. She is taking 1 tablet BID for back pain.  Follow up appt is scheduled for 02/27/15.

## 2015-02-23 NOTE — Telephone Encounter (Signed)
Refilled tizanidine  Laroy Apple, MD [facility] 02/23/2015, 1:02 PM

## 2015-02-26 ENCOUNTER — Ambulatory Visit (INDEPENDENT_AMBULATORY_CARE_PROVIDER_SITE_OTHER): Payer: Medicare Other | Admitting: Family Medicine

## 2015-02-26 ENCOUNTER — Ambulatory Visit (INDEPENDENT_AMBULATORY_CARE_PROVIDER_SITE_OTHER): Payer: Medicare Other

## 2015-02-26 ENCOUNTER — Encounter: Payer: Self-pay | Admitting: Family Medicine

## 2015-02-26 VITALS — BP 94/62 | HR 65 | Temp 97.0°F

## 2015-02-26 DIAGNOSIS — M25551 Pain in right hip: Secondary | ICD-10-CM | POA: Diagnosis not present

## 2015-02-26 DIAGNOSIS — M549 Dorsalgia, unspecified: Secondary | ICD-10-CM | POA: Insufficient documentation

## 2015-02-26 DIAGNOSIS — M545 Low back pain, unspecified: Secondary | ICD-10-CM

## 2015-02-26 DIAGNOSIS — G8911 Acute pain due to trauma: Secondary | ICD-10-CM | POA: Insufficient documentation

## 2015-02-26 DIAGNOSIS — W19XXXA Unspecified fall, initial encounter: Secondary | ICD-10-CM

## 2015-02-26 MED ORDER — OXYCODONE-ACETAMINOPHEN 5-325 MG PO TABS: 1.0000 | ORAL_TABLET | Freq: Three times a day (TID) | ORAL | Status: DC | PRN

## 2015-02-26 MED ORDER — OXYCODONE-ACETAMINOPHEN 10-325 MG PO TABS: 0.5000 | ORAL_TABLET | ORAL | Status: DC | PRN

## 2015-02-26 MED ORDER — OXYCODONE-ACETAMINOPHEN 10-325 MG PO TABS: 1.0000 | ORAL_TABLET | Freq: Three times a day (TID) | ORAL | Status: DC | PRN

## 2015-02-26 NOTE — Progress Notes (Signed)
   HPI  Patient presents today here for eval after a fall  She explains she fell backwards whil climbing into bed difficulty talking,last night. She explains she hist her head in the superior right part of her occiput and R lower back ran into the order of a nightstand. She states that since this time she has not had headaches, vomiting, loss of consciousness, weakness, or numbness. It sounds like she slipped off of a stool  She has had acute right-sided low back pain. This is the area that she usually has chronic back pain. She was previously using Percocet 10 mg for pain prior to moving here.  She also complains of 2-3 months now as persistent itching. She states that she has itching all over with no rash.  PMH: Smoking status noted ROS: Per HPI  Objective: BP 94/62 mmHg  Pulse 65  Temp(Src) 97 F (36.1 C) (Oral)  Ht   Wt  Gen: NAD, alert, cooperative with exam HEENT: NCAT, some tenderness in the right superior occipital-posterior parietal  area. PERRLA Ext: No edema, warm Neuro: Alert and oriented, No gross deficits Msk: Exquisite tenderness over the right lower back across her superior part of her pelvis in the area of the SI joint on the right side, no bruising or lesions visible 5/5 and sensation intact in all 4 extremities Stands with some effort, and take a few steps without any support, limited by pain  X-ray of the lumbar spine: Scoliosis, evidence of old surgical repair with hardware present, no acute fracture or injury of the pelvis apparent. Right hip x-ray- no fracture apparent, some joint space narrowing   Assessment and plan:  # traumatic fall and back pain No fracture  Seen on plain film Patient able to stand and walk slowly WIll treat pain with percoet, has pain clinic appt in 3 days Consider CT to look for occult fracture if she doesn't improve as expected Normal neuro exam, no co ncern for intracranial bleed Reasons to return reviewed in  detail    Orders Placed This Encounter  Procedures  . DG Lumbar Spine 2-3 Views    Standing Status: Future     Number of Occurrences: 1     Standing Expiration Date: 04/27/2016    Order Specific Question:  Reason for Exam (SYMPTOM  OR DIAGNOSIS REQUIRED)    Answer:  back pain after fall    Order Specific Question:  Preferred imaging location?    Answer:  Internal  . DG HIP UNILAT WITH PELVIS 2-3 VIEWS RIGHT    Standing Status: Future     Number of Occurrences: 1     Standing Expiration Date: 04/27/2016    Order Specific Question:  Reason for Exam (SYMPTOM  OR DIAGNOSIS REQUIRED)    Answer:  back pain after fall    Order Specific Question:  Preferred imaging location?    Answer:  Internal     Laroy Apple, MD [facility] 02/26/2015, 2:51 PM

## 2015-02-26 NOTE — Patient Instructions (Addendum)
Great to see you again!  Please come back if your pain is not better within a week  Back Pain, Adult Low back pain is very common. About 1 in 5 people have back pain.The cause of low back pain is rarely dangerous. The pain often gets better over time.About half of people with a sudden onset of back pain feel better in just 2 weeks. About 8 in 10 people feel better by 6 weeks.  CAUSES Some common causes of back pain include:  Strain of the muscles or ligaments supporting the spine.  Wear and tear (degeneration) of the spinal discs.  Arthritis.  Direct injury to the back. DIAGNOSIS Most of the time, the direct cause of low back pain is not known.However, back pain can be treated effectively even when the exact cause of the pain is unknown.Answering your caregiver's questions about your overall health and symptoms is one of the most accurate ways to make sure the cause of your pain is not dangerous. If your caregiver needs more information, he or she may order lab work or imaging tests (X-rays or MRIs).However, even if imaging tests show changes in your back, this usually does not require surgery. HOME CARE INSTRUCTIONS For many people, back pain returns.Since low back pain is rarely dangerous, it is often a condition that people can learn to [facility] their own.   Remain active. It is stressful on the back to sit or stand in one place. Do not sit, drive, or stand in one place for more than 30 minutes at a time. Take short walks on level surfaces as soon as pain allows.Try to increase the length of time you walk each day.  Do not stay in bed.Resting more than 1 or 2 days can delay your recovery.  Do not avoid exercise or work.Your body is made to move.It is not dangerous to be active, even though your back may hurt.Your back will likely heal faster if you return to being active before your pain is gone.  Pay attention to your body when you bend and lift. Many people have less  discomfortwhen lifting if they bend their knees, keep the load close to their bodies,and avoid twisting. Often, the most comfortable positions are those that put less stress on your recovering back.  Find a comfortable position to sleep. Use a firm mattress and lie on your side with your knees slightly bent. If you lie on your back, put a pillow under your knees.  Only take over-the-counter or prescription medicines as directed by your caregiver. Over-the-counter medicines to reduce pain and inflammation are often the most helpful.Your caregiver may prescribe muscle relaxant drugs.These medicines help dull your pain so you can more quickly return to your normal activities and healthy exercise.  Put ice on the injured area.  Put ice in a plastic bag.  Place a towel between your skin and the bag.  Leave the ice on for 15-20 minutes, 03-04 times a day for the first 2 to 3 days. After that, ice and heat may be alternated to reduce pain and spasms.  Ask your caregiver about trying back exercises and gentle massage. This may be of some benefit.  Avoid feeling anxious or stressed.Stress increases muscle tension and can worsen back pain.It is important to recognize when you are anxious or stressed and learn ways to manage it.Exercise is a great option. SEEK MEDICAL CARE IF:  You have pain that is not relieved with rest or medicine.  You have pain that  does not improve in 1 week.  You have new symptoms.  You are generally not feeling well. SEEK IMMEDIATE MEDICAL CARE IF:   You have pain that radiates from your back into your legs.  You develop new bowel or bladder control problems.  You have unusual weakness or numbness in your arms or legs.  You develop nausea or vomiting.  You develop abdominal pain.  You feel faint. Document Released: 05/19/2005 Document Revised: 11/18/2011 Document Reviewed: 09/20/2013 [facility] Patient Information 2015 [place], [place]. This information is  not intended to replace advice given to you by your health care provider. Make sure you discuss any questions you have with your health care provider.

## 2015-02-27 ENCOUNTER — Ambulatory Visit: Payer: Medicare Other | Admitting: Family Medicine

## 2015-02-27 ENCOUNTER — Other Ambulatory Visit: Payer: Self-pay

## 2015-02-27 MED ORDER — ARIPIPRAZOLE 15 MG PO TABS: 15.0000 mg | ORAL_TABLET | Freq: Every day | ORAL | Status: DC

## 2015-03-01 DIAGNOSIS — Z79899 Other long term (current) drug therapy: Secondary | ICD-10-CM | POA: Diagnosis not present

## 2015-03-07 ENCOUNTER — Encounter: Payer: Self-pay | Admitting: Cardiology

## 2015-03-07 ENCOUNTER — Ambulatory Visit (INDEPENDENT_AMBULATORY_CARE_PROVIDER_SITE_OTHER): Payer: Medicare Other | Admitting: Cardiology

## 2015-03-07 VITALS — BP 125/92 | HR 83 | Ht 62.0 in | Wt 189.0 lb

## 2015-03-07 DIAGNOSIS — I480 Paroxysmal atrial fibrillation: Secondary | ICD-10-CM | POA: Diagnosis not present

## 2015-03-07 DIAGNOSIS — I10 Essential (primary) hypertension: Secondary | ICD-10-CM | POA: Diagnosis not present

## 2015-03-07 NOTE — Patient Instructions (Signed)
Medication Instructions:  The current medical regimen is effective;  continue present plan and medications.  Follow-Up: Follow up in 6 months with Dr. Percival Spanish.  You will receive a letter in the mail 2 months before you are due.  Please call us when you receive this letter to schedule your follow up appointment.  Thank you for choosing [facility]!!

## 2015-03-07 NOTE — Progress Notes (Signed)
Cardiology Office Note   Date:  03/07/2015   ID:  Amber Stephenson, DOB 1945-12-31, MRN [id number]  PCP:  Kenn File, MD  Cardiologist:   Minus Breeding, MD   No chief complaint on file.     History of Present Illness: Amber Stephenson is a 69 y.o. female who presents for evaluation of atrial fibrillation. She has moved here from [place] and this is her first visit with me.   I was able to review old records today. In [place] she had atrial fibrillation with rapid rate and underwent TEE guided cardioversion after being treated with amiodarone. Following this she was hospitalized with respiratory failure. She was placed on BiPAP. She apparently was again in atrial fibrillation and had to have another cardioversion. Stress testing was negative for any evidence of ischemia. She did have restrictive lung disease on pulmonary function testing. She was said to have a severe defect on these lung studies.   After the last visit I did order an echocardiogram which demonstrated well-preserved ejection fraction no significant valvular abnormalities.   A Holter monitor demonstrated well controlled atrial fibrillation rate with rare PVCs. She returns for follow-up.   The patient did have a fall and has had some serious back pain since then. She did not have any syncope. During this recovery she did have one day where her blood pressure was very low with systolics in the 24P and she felt lightheaded. However, after this her symptoms resolved and she no longer has lightheadedness or low blood pressures. She still very limited by back pain. She's not noticing her fibrillation. She has no shortness of breath, PND or orthopnea.   Past Medical History  Diagnosis Date  . Diabetes mellitus without complication ([place])   . Hyperlipidemia   . Hypertension   . Bipolar affective ([place])   . Atrial fibrillation [facility])     Past Surgical History  Procedure Laterality Date  . Thigh surgery    . Shoulder  surgery Right   . Breast reduction surgery       Current Outpatient Prescriptions  Medication Sig Dispense Refill  . apixaban (ELIQUIS) 5 MG TABS tablet Take 5 mg by mouth 2 (two) times daily.    . ARIPiprazole (ABILIFY) 15 MG tablet Take 1 tablet (15 mg total) by mouth daily. 90 tablet 0  . atorvastatin (LIPITOR) 40 MG tablet Take 1 tablet (40 mg total) by mouth daily. 90 tablet 3  . Cholecalciferol (VITAMIN D3) 1000 UNITS CAPS Take 1,000 Units by mouth daily.    . fenofibrate (TRICOR) 145 MG tablet Take 145 mg by mouth daily.    . furosemide (LASIX) 20 MG tablet Take 20 mg by mouth 2 (two) times daily.    . Insulin NPH Human, Isophane, (NOVOLIN N [place]) Inject 23 Units into the skin every morning.     . lidocaine (LIDODERM) 5 % Place 1 patch onto the skin 2 (two) times daily. 30 patch 5  . lisinopril (ZESTRIL) 2.5 MG tablet Take 1 tablet (2.5 mg total) by mouth daily. 30 tablet 3  . metFORMIN (GLUCOPHAGE) 1000 MG tablet Take 1,000 mg by mouth 2 (two) times daily with a meal.    . METOPROLOL TARTRATE PO Take 100 mg by mouth 2 (two) times daily.    Marland Kitchen oxyCODONE-acetaminophen (ROXICET) 5-325 MG per tablet Take 1 tablet by mouth every 8 (eight) hours as needed for severe pain. 30 tablet 0  . potassium chloride (K-DUR,KLOR-CON) 10 MEQ tablet TAKE 1 TABLET 2  TIMES DAILY WITH FOOD  0  . pregabalin (LYRICA) 50 MG capsule Take 1 capsule (50 mg total) by mouth 2 (two) times daily. 60 capsule 3  . rizatriptan (MAXALT-MLT) 10 MG disintegrating tablet DISSOLVE 1 TABLET SUBLINGUALLY IMMEDIATELY, MAY REPEAT IN 1 HOUR IF NEEDED (MAX OF 3 TABS/24 HOURS) 10 tablet 3  . SUMAtriptan (IMITREX) 100 MG tablet Take 100 mg by mouth See admin instructions.  1  . tiZANidine (ZANAFLEX) 4 MG tablet Take 1 tablet (4 mg total) by mouth 2 (two) times daily. 60 tablet 2   No current facility-administered medications for this visit.    Allergies:   Ivp dye    ROS:  Please see the history of present illness.   Otherwise,  review of systems are positive for positive for joint and back pain.   All other systems are reviewed and negative.    PHYSICAL EXAM: VS:  BP 125/92 mmHg  Pulse 83  Ht 5\' 2"  (1.575 m)  Wt 189 lb (85.73 kg)  BMI 34.56 kg/m2 , BMI Body mass index is 34.56 kg/(m^2). GENERAL:  Well appearing HEENT:  Pupils equal round and reactive, fundi not visualized, oral mucosa unremarkable NECK:  No jugular venous distention, waveform within normal limits, carotid upstroke brisk and symmetric, no bruits, no thyromegaly LYMPHATICS:  No cervical, inguinal adenopathy LUNGS:  Clear to auscultation bilaterally BACK:  No CVA tenderness CHEST:  Unremarkable HEART:  PMI not displaced or sustained,S1 and S2 within normal limits, no S3, no clicks, no rubs, no murmurs, irregular ABD:  Flat, positive bowel sounds normal in frequency in pitch, no bruits, no rebound, no guarding, no midline pulsatile mass, no hepatomegaly, no splenomegaly EXT:  2 plus pulses throughout, no edema, no cyanosis no clubbing SKIN:  No rashes no nodules NEURO:  Cranial nerves II through XII grossly intact, motor grossly intact throughout PSYCH:  Cognitively intact, oriented to person place and time    EKG:  EKG is not ordered today.    Recent Labs: 01/04/2015: ALT 11; TSH 3.550 02/08/2015: BUN 18; Creatinine, Ser 1.16*; Potassium 5.1; Sodium 143    Lipid Panel    Component Value Date/Time   CHOL 163 01/04/2015 1028   TRIG 249* 01/04/2015 1028   HDL 42 01/04/2015 1028   CHOLHDL 3.9 01/04/2015 1028   LDLCALC 71 01/04/2015 1028      Wt Readings from Last 3 Encounters:  03/07/15 189 lb (85.73 kg)  02/08/15 189 lb 12.8 oz (86.093 kg)  02/07/15 191 lb (86.637 kg)      Other studies Reviewed: Additional studies/ records that were reviewed today include: Records from [place]. Review of the above records demonstrates:  Please see elsewhere in the note.     ASSESSMENT AND PLAN:  ATRIAL FIB:  This is permanent.  I will  continue to control her rate and management with anticoagulation. She previously failed to maintain sinus rhythm after cardioversion 2 when she is asymptomatic. She tolerates the current blood thinners. I will check an echocardiogram.  Ms. Courtland Coppa has a CHA2DS2 - VASc score of 3 with a risk of stroke of 3.2%.  CHRONIC DIASTOLIC HF: She seems to be euvolemic.  She has a well preserved ejection fraction. No change in therapy is indicated.  CKD:  She has chronic kidney disease probably related to hypertension. This will be followed closely as she is now on an ACE inhibitor.  HTN:  This is reported on her problem list although she reports is not typically  elevated.  She will remain on the meds as listed    Current medicines are reviewed at length with the patient today.  The patient does not have concerns regarding medicines.  The following changes have been made:  no change   Disposition:   FU with me in six months.     Signed, Minus Breeding, MD  03/07/2015 4:07 PM    [facility]

## 2015-03-16 ENCOUNTER — Telehealth: Payer: Self-pay | Admitting: Cardiology

## 2015-03-16 NOTE — Telephone Encounter (Signed)
ROI faxed to Dr.Roger Acheatel, records rec back sent interoffice to [facility].

## 2015-03-19 ENCOUNTER — Other Ambulatory Visit: Payer: Self-pay | Admitting: Family Medicine

## 2015-03-19 MED ORDER — SUMATRIPTAN SUCCINATE 100 MG PO TABS: 100.0000 mg | ORAL_TABLET | ORAL | Status: DC

## 2015-03-20 ENCOUNTER — Telehealth: Payer: Self-pay | Admitting: Family Medicine

## 2015-03-20 ENCOUNTER — Other Ambulatory Visit: Payer: Self-pay | Admitting: *Deleted

## 2015-03-20 MED ORDER — SUMATRIPTAN SUCCINATE 100 MG PO TABS: 100.0000 mg | ORAL_TABLET | ORAL | Status: DC

## 2015-03-20 NOTE — Telephone Encounter (Signed)
done

## 2015-03-21 ENCOUNTER — Telehealth: Payer: Self-pay | Admitting: Family Medicine

## 2015-03-21 NOTE — Telephone Encounter (Signed)
Patient aware that [facility]

## 2015-03-26 ENCOUNTER — Other Ambulatory Visit: Payer: Self-pay | Admitting: Family Medicine

## 2015-03-26 MED ORDER — METFORMIN HCL 1000 MG PO TABS: 1000.0000 mg | ORAL_TABLET | Freq: Two times a day (BID) | ORAL | Status: DC

## 2015-03-26 NOTE — Telephone Encounter (Signed)
done

## 2015-03-27 ENCOUNTER — Other Ambulatory Visit (HOSPITAL_COMMUNITY): Payer: Self-pay | Admitting: Nephrology

## 2015-03-27 DIAGNOSIS — E1129 Type 2 diabetes mellitus with other diabetic kidney complication: Secondary | ICD-10-CM | POA: Diagnosis not present

## 2015-03-27 DIAGNOSIS — I10 Essential (primary) hypertension: Secondary | ICD-10-CM | POA: Diagnosis not present

## 2015-03-27 DIAGNOSIS — I4891 Unspecified atrial fibrillation: Secondary | ICD-10-CM | POA: Diagnosis not present

## 2015-03-27 DIAGNOSIS — N289 Disorder of kidney and ureter, unspecified: Secondary | ICD-10-CM

## 2015-03-27 DIAGNOSIS — N179 Acute kidney failure, unspecified: Secondary | ICD-10-CM | POA: Diagnosis not present

## 2015-03-28 DIAGNOSIS — Z79891 Long term (current) use of opiate analgesic: Secondary | ICD-10-CM | POA: Diagnosis not present

## 2015-03-30 ENCOUNTER — Encounter: Payer: Self-pay | Admitting: Family Medicine

## 2015-03-30 ENCOUNTER — Ambulatory Visit (INDEPENDENT_AMBULATORY_CARE_PROVIDER_SITE_OTHER): Payer: Medicare Other | Admitting: Family Medicine

## 2015-03-30 VITALS — BP 127/80 | HR 78 | Temp 97.1°F | Ht 62.0 in | Wt 185.6 lb

## 2015-03-30 DIAGNOSIS — N183 Chronic kidney disease, stage 3 unspecified: Secondary | ICD-10-CM

## 2015-03-30 DIAGNOSIS — Z23 Encounter for immunization: Secondary | ICD-10-CM | POA: Diagnosis not present

## 2015-03-30 DIAGNOSIS — E119 Type 2 diabetes mellitus without complications: Secondary | ICD-10-CM

## 2015-03-30 DIAGNOSIS — E1122 Type 2 diabetes mellitus with diabetic chronic kidney disease: Secondary | ICD-10-CM

## 2015-03-30 DIAGNOSIS — Z1159 Encounter for screening for other viral diseases: Secondary | ICD-10-CM | POA: Diagnosis not present

## 2015-03-30 DIAGNOSIS — L299 Pruritus, unspecified: Secondary | ICD-10-CM | POA: Diagnosis not present

## 2015-03-30 DIAGNOSIS — I10 Essential (primary) hypertension: Secondary | ICD-10-CM | POA: Diagnosis not present

## 2015-03-30 LAB — POCT GLYCOSYLATED HEMOGLOBIN (HGB A1C): Hemoglobin A1C: 8

## 2015-03-30 MED ORDER — METHYLPREDNISOLONE ACETATE 80 MG/ML IJ SUSP
80.0000 mg | Freq: Once | INTRAMUSCULAR | Status: AC
  Administered 2015-03-30: 40 mg via INTRAMUSCULAR

## 2015-03-30 NOTE — Progress Notes (Signed)
   HPI  Patient presents today here for follow-up diabetes and itching.  Diabetes Fasting blood sugar is reported to be average of 230-250 She is taking 42 units of Lantus in the evening and 25 units of Novolin in the a.m. She denies hypoglycemia She previously was taking NovoLog sliding scale but not in the last 3 months. She has not been watching her diet She is also taking metformin Her kidney specialist has recommended that she see an endocrinologist  Itching Describes diffuse irritating itching that is messing up her sleep. She is started Zyrtec and Pepcid with no relief, this helped previously Hydroxyzine has previously been tried and did not help She previously got good relief with steroid injection which she requested today She denies rash  She denies dyspnea, chest pain   PMH: Smoking status noted ROS: Per HPI  Objective: BP 127/80 mmHg  Pulse 78  Temp(Src) 97.1 F (36.2 C) (Oral)  Ht 5\' 2"  (1.575 m)  Wt 185 lb 9.6 oz (84.188 kg)  BMI 33.94 kg/m2 Gen: NAD, alert, cooperative with exam HEENT: NCAT CV: RRR, good S1/S2, no murmur Resp: CTABL, no wheezes, non-labored Ext: No edema, warm Neuro: Alert and oriented, No gross deficits  Foot - sensation intact monofilament throughout, no lesions  Assessment and plan:  # Diabetes Control slipping, A1c has changed from 7.3 to 8.0 Fasting blood sugar uncontrolled Her insulin regimen is complicated with Lantus at night, Novolin in the morning, and sliding scale NovoLog At this time will try to increase Lantus to compensate for removing Novolin to simplify her treatment Hold Novolog for now Recommended keeping close record including most importantly fasting blood sugars and at least 2 postprandial numbers daily Increase Lantus to 48 units tonight, discontinue Novolin and given instructions to titrate Lantus  # Pruritus Difficult to elucidate an etiology Unlikely to be a renal source as her urine is not usually that  elevated, no rash She seemed to be very irritated today so we went ahead and gave 40 mg of Depo-Medrol, explained that this will negatively impact her glycemic control Labs pending Continue Pepcid and zyrtec (this helped previously)  # HCM FLu shot HIV and Hep c checked Foot exam normal   Laroy Apple, MD [facility] 03/30/2015, 2:05 PM

## 2015-03-30 NOTE — Patient Instructions (Addendum)
   Great to see you!   Come back in 1 month  We will call with your lab results within 1 week   Stop Novalin N,   Increase Lantus to 48 units and increase by 2 units every 2 days until your fasting blood sugar is within 150 and 200 Do not go higher than 70 units of insulin When you are higher than 60 units of lantus it is usually preferable to split it into 2 separate injections, for example 60 units would be administered as 30 units twice a day  No novolog for now  Keep a close log

## 2015-03-31 LAB — CMP14+EGFR
ALT: 16 IU/L (ref 0–32)
AST: 23 IU/L (ref 0–40)
Albumin/Globulin Ratio: 1.7 (ref 1.1–2.5)
Albumin: 4.4 g/dL (ref 3.6–4.8)
Alkaline Phosphatase: 43 IU/L (ref 39–117)
BUN/Creatinine Ratio: 21 (ref 11–26)
BUN: 34 mg/dL — ABNORMAL HIGH (ref 8–27)
Bilirubin Total: 0.4 mg/dL (ref 0.0–1.2)
CO2: 23 mmol/L (ref 18–29)
Calcium: 10.2 mg/dL (ref 8.7–10.3)
Chloride: 101 mmol/L (ref 97–106)
Creatinine, Ser: 1.59 mg/dL — ABNORMAL HIGH (ref 0.57–1.00)
GFR calc Af Amer: 38 mL/min/{1.73_m2} — ABNORMAL LOW (ref >59)
GFR calc non Af Amer: 33 mL/min/{1.73_m2} — ABNORMAL LOW (ref >59)
Globulin, Total: 2.6 g/dL (ref 1.5–4.5)
Glucose: 150 mg/dL — ABNORMAL HIGH (ref 65–99)
Potassium: 4.5 mmol/L (ref 3.5–5.2)
Sodium: 142 mmol/L (ref 136–144)
Total Protein: 7 g/dL (ref 6.0–8.5)

## 2015-03-31 LAB — HEPATITIS C ANTIBODY: Hep C Virus Ab: <0.1 s/co ratio (ref 0.0–0.9)

## 2015-04-02 ENCOUNTER — Telehealth: Payer: Self-pay | Admitting: Family Medicine

## 2015-04-04 ENCOUNTER — Ambulatory Visit (HOSPITAL_COMMUNITY)
Admission: RE | Admit: 2015-04-04 | Discharge: 2015-04-04 | Disposition: A | Discharge status: Home / Self Care | Payer: Medicare Other | Source: Ambulatory Visit | Attending: Nephrology | Admitting: Nephrology

## 2015-04-04 DIAGNOSIS — R7989 Other specified abnormal findings of blood chemistry: Secondary | ICD-10-CM | POA: Diagnosis not present

## 2015-04-04 DIAGNOSIS — N189 Chronic kidney disease, unspecified: Secondary | ICD-10-CM | POA: Diagnosis not present

## 2015-04-04 DIAGNOSIS — Z79899 Other long term (current) drug therapy: Secondary | ICD-10-CM | POA: Diagnosis not present

## 2015-04-04 DIAGNOSIS — N289 Disorder of kidney and ureter, unspecified: Secondary | ICD-10-CM | POA: Insufficient documentation

## 2015-04-04 DIAGNOSIS — R748 Abnormal levels of other serum enzymes: Secondary | ICD-10-CM | POA: Diagnosis not present

## 2015-04-04 DIAGNOSIS — R809 Proteinuria, unspecified: Secondary | ICD-10-CM | POA: Diagnosis not present

## 2015-04-04 DIAGNOSIS — E559 Vitamin D deficiency, unspecified: Secondary | ICD-10-CM | POA: Diagnosis not present

## 2015-04-04 DIAGNOSIS — D649 Anemia, unspecified: Secondary | ICD-10-CM | POA: Diagnosis not present

## 2015-04-04 MED ORDER — FREESTYLE LITE DEVI: Status: DC

## 2015-04-04 MED ORDER — FREESTYLE LANCETS MISC: Status: DC

## 2015-04-04 MED ORDER — GLUCOSE BLOOD VI STRP: ORAL_STRIP | Status: DC

## 2015-04-04 NOTE — Telephone Encounter (Signed)
done

## 2015-04-09 DIAGNOSIS — M47816 Spondylosis without myelopathy or radiculopathy, lumbar region: Secondary | ICD-10-CM | POA: Diagnosis not present

## 2015-04-09 DIAGNOSIS — G894 Chronic pain syndrome: Secondary | ICD-10-CM | POA: Diagnosis not present

## 2015-04-13 ENCOUNTER — Other Ambulatory Visit: Payer: Self-pay | Admitting: *Deleted

## 2015-04-13 MED ORDER — ARIPIPRAZOLE 15 MG PO TABS: 15.0000 mg | ORAL_TABLET | Freq: Every day | ORAL | Status: DC

## 2015-04-13 NOTE — Telephone Encounter (Signed)
Received fax refill request for metoprolol. Was only in system as a historical med. Can you please order with directions

## 2015-04-16 ENCOUNTER — Other Ambulatory Visit: Payer: Self-pay | Admitting: Family Medicine

## 2015-04-16 DIAGNOSIS — M549 Dorsalgia, unspecified: Principal | ICD-10-CM

## 2015-04-16 DIAGNOSIS — G8929 Other chronic pain: Secondary | ICD-10-CM

## 2015-04-16 MED ORDER — PREGABALIN 50 MG PO CAPS: ORAL_CAPSULE | ORAL | Status: DC

## 2015-04-16 MED ORDER — METOPROLOL TARTRATE 100 MG PO TABS: 100.0000 mg | ORAL_TABLET | Freq: Two times a day (BID) | ORAL | Status: DC

## 2015-04-16 NOTE — Telephone Encounter (Signed)
This must be printed and patient must send. We do not do control electronically.

## 2015-04-16 NOTE — Telephone Encounter (Signed)
Dosage is correct & pt aware refill was sent in

## 2015-04-16 NOTE — Telephone Encounter (Signed)
This is within regular dosing even considering for CrCl of 45, will ask nursing to call in.   Laroy Apple, MD [facility] 04/16/2015, 1:06 PM

## 2015-04-16 NOTE — Addendum Note (Signed)
Addended by: Timmothy Euler on: 04/16/2015 05:29 PM   Modules accepted: Orders

## 2015-04-17 NOTE — Telephone Encounter (Signed)
Pt aware rx needs to be picked up and mailed to pharmacy.

## 2015-04-24 ENCOUNTER — Telehealth: Payer: Self-pay | Admitting: Family Medicine

## 2015-04-25 ENCOUNTER — Other Ambulatory Visit: Payer: Self-pay

## 2015-04-25 ENCOUNTER — Telehealth: Payer: Self-pay

## 2015-04-25 MED ORDER — LISINOPRIL 2.5 MG PO TABS: 2.5000 mg | ORAL_TABLET | Freq: Every day | ORAL | Status: DC

## 2015-04-25 NOTE — Telephone Encounter (Signed)
Please advise  I don t see Tramadol on her med list   Dr Wendi Snipes last saw her

## 2015-04-25 NOTE — Telephone Encounter (Signed)
[facility] for [facility]ill of Tramadol. Do not see on current med list. It was last filled on 03-27-15. Please advise. If approved please print and route to Pool A so nurse can call patient to pick up

## 2015-04-25 NOTE — Telephone Encounter (Signed)
Sorry, she will need to be seen. Dr. Wendi Snipes will be back on Friday. She already has an appt. With him on Monday

## 2015-04-25 NOTE — Telephone Encounter (Signed)
Patient informed, appointment made for 04/27/15 at 8:25 am

## 2015-04-27 ENCOUNTER — Ambulatory Visit (INDEPENDENT_AMBULATORY_CARE_PROVIDER_SITE_OTHER): Payer: Medicare Other | Admitting: Family Medicine

## 2015-04-27 ENCOUNTER — Encounter: Payer: Self-pay | Admitting: Family Medicine

## 2015-04-27 VITALS — BP 160/109 | HR 83 | Temp 96.9°F | Ht 62.0 in | Wt 188.2 lb

## 2015-04-27 DIAGNOSIS — G2581 Restless legs syndrome: Secondary | ICD-10-CM

## 2015-04-27 DIAGNOSIS — I480 Paroxysmal atrial fibrillation: Secondary | ICD-10-CM

## 2015-04-27 DIAGNOSIS — I10 Essential (primary) hypertension: Secondary | ICD-10-CM | POA: Diagnosis not present

## 2015-04-27 DIAGNOSIS — M549 Dorsalgia, unspecified: Secondary | ICD-10-CM

## 2015-04-27 DIAGNOSIS — G8929 Other chronic pain: Secondary | ICD-10-CM

## 2015-04-27 HISTORY — DX: Restless legs syndrome: G25.81

## 2015-04-27 MED ORDER — TRAMADOL HCL 50 MG PO TABS: 50.0000 mg | ORAL_TABLET | Freq: Four times a day (QID) | ORAL | Status: DC | PRN

## 2015-04-27 MED ORDER — ROPINIROLE HCL 0.25 MG PO TABS: 0.2500 mg | ORAL_TABLET | Freq: Every day | ORAL | Status: DC

## 2015-04-27 NOTE — Progress Notes (Signed)
   HPI  Patient presents today for follow-up of chronic pain and to discuss restless leg.  Chronic pain She has chronic low back pain. She is seeing a pain clinic who are performing epidural injections which have been helping. She has actually not been taking the oxycodone did prescribed. Instead she is using approximately 1 tramadol 4 days per week. She is on a pain contract the clinic and she agrees to inform them that I prescribed tramadol to give them an opportunity to prescribe for her as she is preferring this.  Restless leg 2 week onset, associated low back pain Has cramping and constant feeling of needing to move her legs bilaterally at night. It's messing up her sleep and she's been sleeping only 4-5 hours per night.  Diabetes Has increased up to 54 units of Lantus daily, fasting blood sugar ranges 70 to 130 No hyopglycemia  PMH: Smoking status noted ROS: Per HPI  Objective: BP 160/109 mmHg  Pulse 83  Temp(Src) 96.9 F (36.1 C) (Oral)  Ht 5\' 2"  (1.575 m)  Wt 188 lb 3.2 oz (85.367 kg)  BMI 34.41 kg/m2 Gen: NAD, alert, cooperative with exam HEENT: NCAT CV: irreg irreg, normal rate, no murmur Resp: CTABL, no wheezes, non-labored Ext: No edema, warm Neuro: Alert and oriented, No gross deficits  Assessment and plan:  # Hypertension Patient with an argument with her husband this morning and also has taken Imitrex for headache, she will monitor at home No red flags  # Chronic pain Refilled tramadol, she is at a very reasonable use She is receiving epidural injections for her pain clinic She is not taking oxycodone, we discussed this I recommended that she call her pain clinic to discuss me prescribing tramadol, I am okay if they prescribed, I am also okay with prescribing here as well.  # Restless leg Consider increasing Lyrica, however considering her renal disease with ahead and prescribed Requip instead. 0.25 mg 1-3 hours before bedtime, may increase to 0.5 mg  after 3-5 nights   Meds ordered this encounter  Medications  . DISCONTD: traMADol (ULTRAM) 50 MG tablet    Sig: Take 50 mg by mouth every 6 (six) hours as needed. for pain    Refill:  2  . traMADol (ULTRAM) 50 MG tablet    Sig: Take 1 tablet (50 mg total) by mouth every 6 (six) hours as needed. for pain    Dispense:  30 tablet    Refill:  2  . rOPINIRole (REQUIP) 0.25 MG tablet    Sig: Take 1 tablet (0.25 mg total) by mouth at bedtime.    Dispense:  30 tablet    Refill:  [place], MD [facility] 04/27/2015, 9:26 AM

## 2015-04-27 NOTE — Patient Instructions (Signed)
Great to see you!  i have started requip 1-3 hours before bedtime for restless legs.   Restless Legs Syndrome Restless legs syndrome is a condition that causes uncomfortable feelings or sensations in the legs, especially while sitting or lying down. The sensations usually cause an overwhelming urge to move the legs. The arms can also sometimes be affected. The condition can range from mild to severe. The symptoms often interfere with a person's ability to sleep. CAUSES The cause of this condition is not known. RISK FACTORS This condition is more likely to develop in:  People who are older than age 36.  Pregnant women. In general, restless legs syndrome is more common in women than in men.  People who have a family history of the condition.  People who have certain medical conditions, such as iron deficiency, kidney disease, Parkinson disease, or nerve damage.  People who take certain medicines, such as medicines for high blood pressure, nausea, colds, allergies, depression, and some heart conditions. SYMPTOMS The main symptom of this condition is uncomfortable sensations in the legs. These sensations may be:  Described as pulling, tingling, prickling, throbbing, crawling, or burning.  Worse while you are sitting or lying down.  Worse during periods of rest or inactivity.  Worse at night, often interfering with your sleep.  Accompanied by a very strong urge to move your legs.  Temporarily relieved by movement of your legs. The sensations usually affect both sides of the body. The arms can also be affected, but this is rare. People who have this condition often have tiredness during the day because of their lack of sleep at night. DIAGNOSIS This condition may be diagnosed based on your description of the symptoms. You may also have tests, including blood tests, to check for other conditions that may lead to your symptoms. In some cases, you may be asked to spend some time in a  sleep lab so your sleeping can be monitored. TREATMENT Treatment for this condition is focused on managing the symptoms. Treatment may include:  Self-help and lifestyle changes.  Medicines. HOME CARE INSTRUCTIONS  Take medicines only as directed by your health care provider.  Try these methods to get temporary relief from the uncomfortable sensations:  Massage your legs.  Walk or stretch.  Take a cold or hot bath.  Practice good sleep habits. For example, go to bed and get up at the same time every day.  Exercise regularly.  Practice ways of relaxing, such as yoga or meditation.  Avoid caffeine and alcohol.  Do not use any tobacco products, including cigarettes, chewing tobacco, or electronic cigarettes. If you need help quitting, ask your health care provider.  Keep all follow-up visits as directed by your health care provider. This is important. SEEK MEDICAL CARE IF: Your symptoms do not improve with treatment, or they get worse.   This information is not intended to replace advice given to you by your health care provider. Make sure you discuss any questions you have with your health care provider.   Document Released: 05/09/2002 Document Revised: 10/03/2014 Document Reviewed: 05/15/2014 Elsevier Interactive Patient Education [employer].

## 2015-04-27 NOTE — Telephone Encounter (Signed)
Pt has ov today in clinic, will discuss.   Laroy Apple, MD [facility] 04/27/2015, 7:49 AM

## 2015-04-30 ENCOUNTER — Ambulatory Visit: Payer: Medicare Other | Admitting: Family Medicine

## 2015-05-08 ENCOUNTER — Telehealth: Payer: Self-pay | Admitting: Family Medicine

## 2015-05-09 ENCOUNTER — Telehealth: Payer: Self-pay | Admitting: Family Medicine

## 2015-05-09 MED ORDER — TIZANIDINE HCL 4 MG PO TABS: 4.0000 mg | ORAL_TABLET | Freq: Two times a day (BID) | ORAL | Status: DC

## 2015-05-09 MED ORDER — ROPINIROLE HCL 0.25 MG PO TABS: 0.2500 mg | ORAL_TABLET | Freq: Every day | ORAL | Status: DC

## 2015-05-09 NOTE — Telephone Encounter (Signed)
done

## 2015-05-10 ENCOUNTER — Telehealth: Payer: Self-pay | Admitting: Family Medicine

## 2015-05-10 NOTE — Telephone Encounter (Signed)
Could you check into this? It says she was given a rx with 3 refills on 04/27/15

## 2015-05-10 NOTE — Telephone Encounter (Signed)
[facility] and they can transfer the 2 refills of tramadol to her new pharmacy. Pt aware.

## 2015-05-11 ENCOUNTER — Other Ambulatory Visit: Payer: Self-pay

## 2015-05-11 ENCOUNTER — Ambulatory Visit: Payer: Medicare Other | Admitting: "Endocrinology

## 2015-05-11 MED ORDER — TIZANIDINE HCL 4 MG PO TABS: 4.0000 mg | ORAL_TABLET | Freq: Two times a day (BID) | ORAL | Status: DC

## 2015-05-15 DIAGNOSIS — N189 Chronic kidney disease, unspecified: Secondary | ICD-10-CM | POA: Diagnosis not present

## 2015-05-15 DIAGNOSIS — E1129 Type 2 diabetes mellitus with other diabetic kidney complication: Secondary | ICD-10-CM | POA: Diagnosis not present

## 2015-05-15 DIAGNOSIS — R809 Proteinuria, unspecified: Secondary | ICD-10-CM | POA: Diagnosis not present

## 2015-05-15 DIAGNOSIS — I10 Essential (primary) hypertension: Secondary | ICD-10-CM | POA: Diagnosis not present

## 2015-05-17 DIAGNOSIS — M7062 Trochanteric bursitis, left hip: Secondary | ICD-10-CM | POA: Diagnosis not present

## 2015-05-21 ENCOUNTER — Ambulatory Visit: Payer: Medicare Other | Admitting: Nutrition

## 2015-05-21 ENCOUNTER — Encounter: Payer: Self-pay | Admitting: Nutrition

## 2015-05-21 ENCOUNTER — Encounter: Payer: Self-pay | Admitting: "Endocrinology

## 2015-05-21 ENCOUNTER — Ambulatory Visit (INDEPENDENT_AMBULATORY_CARE_PROVIDER_SITE_OTHER): Payer: Medicare Other | Admitting: "Endocrinology

## 2015-05-21 VITALS — BP 136/82 | HR 80 | Ht 62.0 in | Wt 197.0 lb

## 2015-05-21 DIAGNOSIS — E785 Hyperlipidemia, unspecified: Secondary | ICD-10-CM

## 2015-05-21 DIAGNOSIS — N183 Chronic kidney disease, stage 3 unspecified: Secondary | ICD-10-CM

## 2015-05-21 DIAGNOSIS — E1122 Type 2 diabetes mellitus with diabetic chronic kidney disease: Secondary | ICD-10-CM

## 2015-05-21 DIAGNOSIS — I10 Essential (primary) hypertension: Secondary | ICD-10-CM

## 2015-05-21 NOTE — Patient Instructions (Signed)

## 2015-05-21 NOTE — Progress Notes (Signed)
  Medical Nutrition Therapy:  Appt start time: 1500 end time:  0626.   Assessment:  Primary concerns today:  Diabetes Type 2. Walk in from Dr. Dorris Fetch. Lives with her husband. She and her husband shop together. Foods are baked, broiled and fried. Eats out a few times per week. Checks BS 1 once a day. A1C 8.% Physical activity: ADL. Just moved here from [place].. Has gotten a Control and instrumentation engineer and willing to start going. 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 Has never met with a dietitian before. Usually eats 2 meals per day; skipping lunch often and then overeats at dinner. 70 units of Lantus at night daily.Injects in her legs usually.  Stopping Metformin due to kidney issues.  Diet is excessive in carbs, calories, sodium and low in fresh fruits and low carb vegetables.  Willing to change eating habits and exercise to improve blood sugars and get better blood sugar control. Didn't answer safety questions due to short walk in visit. Will address at next visit.  Preferred Learning Style:  No preference indicated   Learning Readiness:  Ready  Change in progress   MEDICATIONS: See    DIETARY INTAKE: 24-hr recall:  B ( AM):  Almond crunch, skim milk, OR toast Snk ( AM): none  L ( PM): hasn't eaten lunch: chicken salad sandwich on white bread, Coke  Snk ( PM):  D ( PM): Meats and some vegetables; water Snk ( PM): cake, pie, crackers,  Beverages:  Water,   Usual physical activity: ADL   Estimated energy needs: 1200 calories 135 g carbohydrates 90 g protein 33 g fat  Progress Towards Goal(s):  In progress.   Nutritional Diagnosis:  NB-1.1 Food and nutrition-related knowledge deficit As related to Dm .  As evidenced by A1C 8%.    Intervention:  Nutrition and Diabetes education provided on My Plate, CHO counting, meal planning, portion sizes, timing of meals, avoiding snacks between meals unless having a low blood sugar, target ranges for A1C and blood sugars,  signs/symptoms and treatment of hyper/hypoglycemia, monitoring blood sugars, taking medications as prescribed, benefits of exercising 30 minutes per day and prevention of complications of DM.  Goals: 1. Follow My Plate Method 2. Increase fresh fruits and vegetables. 3. Drink only water; cut out sodas, juice, tea etc 4. No snacks between or after meals. 5. Exercise 30 minutes three time per week. 6. Test blood sugars before meals and at bedtime.  7. Keep a food journal and bring at next visit. 8. Get A1C downto 7% in three months. 9.Take 50 units of Lantus; injecting in the abdomen instead of the legs.  Teaching Method Utilized:  Visual Auditory Hands on  Handouts given during visit include:  The Plate Method  Meal Plan Card  Diabetes Instructions.   Barriers to learning/adherence to lifestyle change:  None  Demonstrated degree of understanding via:  Teach Back   Monitoring/Evaluation:  Dietary intake, exercise,  Meal planning, SBG, and body weight in 1 month(s).

## 2015-05-21 NOTE — Patient Instructions (Signed)
Goals: 1. Follow My Plate Method 2. Increase fresh fruits and vegetables. 3. Drink only water; cut out sodas, juice, tea etc 4. No snacks between or after meals. 5. Exercise 30 minutes three time per week. 6. Test blood sugars before meals and at bedtime.  7. Keep a food journal and bring at next visit. 8. Get A1C downto 7% in three months. 9.Take 50 units of Lantus; injecting in the abdomen instead of the legs.

## 2015-05-22 NOTE — Progress Notes (Signed)
Subjective:    Patient ID: Amber Stephenson, female    DOB: [birth year]. Patient is being seen in consultation for management of diabetes requested by  Kenn File, MD  Past Medical History  Diagnosis Date  . Diabetes mellitus without complication ([place])   . Hyperlipidemia   . Hypertension   . Bipolar affective ([place])   . Atrial fibrillation [name])    Past Surgical History  Procedure Laterality Date  . Thigh surgery    . Shoulder surgery Right   . Breast reduction surgery     Social History   Social History  . Marital Status: Married    Spouse Name: N/A  . Number of Children: 3  . Years of Education: N/A   Social History Main Topics  . Smoking status: Never Smoker   . Smokeless tobacco: None  . Alcohol Use: No  . Drug Use: No  . Sexual Activity: Not Asked   Other Topics Concern  . None   Social History Narrative   Lives at home with husband.    Outpatient Encounter Prescriptions as of 05/21/2015  Medication Sig  . apixaban (ELIQUIS) 5 MG TABS tablet Take 5 mg by mouth 2 (two) times daily.  . ARIPiprazole (ABILIFY) 15 MG tablet Take 1 tablet (15 mg total) by mouth daily.  Marland Kitchen atorvastatin (LIPITOR) 40 MG tablet Take 1 tablet (40 mg total) by mouth daily.  . Cholecalciferol (VITAMIN D3) 1000 UNITS CAPS Take 1,000 Units by mouth daily.  . fenofibrate (TRICOR) 145 MG tablet Take 145 mg by mouth daily.  . furosemide (LASIX) 20 MG tablet Take 20 mg by mouth 2 (two) times daily.  . Insulin Glargine (LANTUS SOLOSTAR) 100 UNIT/ML Solostar Pen Inject 50 Units into the skin at bedtime.  . pregabalin (LYRICA) 50 MG capsule 1 pill morning and afternoon, 2 pills before bed  . [DISCONTINUED] metFORMIN (GLUCOPHAGE) 1000 MG tablet Take 1 tablet (1,000 mg total) by mouth 2 (two) times daily with a meal.  . Blood Glucose Monitoring Suppl (FREESTYLE LITE) DEVI Use to check blood sugar tid. DX E11.22  . glucose blood (FREESTYLE LITE) test strip Test tid. DX E11.22  . Lancets  (FREESTYLE) lancets Test tid. DX E11.22  . lidocaine (LIDODERM) 5 % Place 1 patch onto the skin 2 (two) times daily.  Marland Kitchen lisinopril (ZESTRIL) 2.5 MG tablet Take 1 tablet (2.5 mg total) by mouth daily.  . metoprolol (LOPRESSOR) 100 MG tablet Take 1 tablet (100 mg total) by mouth 2 (two) times daily.  Marland Kitchen oxyCODONE-acetaminophen (ROXICET) 5-325 MG per tablet Take 1 tablet by mouth every 8 (eight) hours as needed for severe pain.  . potassium chloride (K-DUR,KLOR-CON) 10 MEQ tablet TAKE 1 TABLET 2 TIMES DAILY WITH FOOD  . rizatriptan (MAXALT-MLT) 10 MG disintegrating tablet DISSOLVE 1 TABLET SUBLINGUALLY IMMEDIATELY, MAY REPEAT IN 1 HOUR IF NEEDED (MAX OF 3 TABS/24 HOURS)  . rOPINIRole (REQUIP) 0.25 MG tablet Take 1 tablet (0.25 mg total) by mouth at bedtime.  . SUMAtriptan (IMITREX) 100 MG tablet Take 1 tablet (100 mg total) by mouth See admin instructions.  Marland Kitchen tiZANidine (ZANAFLEX) 4 MG tablet Take 1 tablet (4 mg total) by mouth 2 (two) times daily.  . traMADol (ULTRAM) 50 MG tablet Take 1 tablet (50 mg total) by mouth every 6 (six) hours as needed. for pain  . [DISCONTINUED] Insulin NPH Human, Isophane, (NOVOLIN N [place]) Inject 23 Units into the skin every morning.    No facility-administered encounter medications on file as of  05/21/2015.   ALLERGIES: Allergies  Allergen Reactions  . Ivp Dye [Iodinated Diagnostic Agents] Swelling    Throat closes   VACCINATION STATUS: Immunization History  Administered Date(s) Administered  . Influenza,inj,Quad PF,36+ Mos 03/30/2015    Diabetes She presents for her initial diabetic visit. She has type 2 diabetes mellitus. Onset time: She was diagnosed at approximate age of 69 years. Her disease course has been worsening. There are no hypoglycemic associated symptoms. Pertinent negatives for hypoglycemia include no confusion, headaches, pallor or seizures. There are no diabetic associated symptoms. Pertinent negatives for diabetes include no chest pain, no  polydipsia, no polyphagia and no polyuria. There are no hypoglycemic complications. Symptoms are worsening. Diabetic complications include nephropathy. Risk factors for coronary artery disease include diabetes mellitus, dyslipidemia, hypertension, obesity and sedentary lifestyle. Current diabetic treatment includes insulin injections and oral agent (monotherapy). She is compliant with treatment most of the time. Her weight is increasing steadily. She is following a generally unhealthy diet. When asked about meal planning, she reported none. She has not had a previous visit with a dietitian. She never participates in exercise. (She did not bring any meter nor logs, and admits she is not monitoring on a regular basis.) An ACE inhibitor/angiotensin II receptor blocker is being taken.  Hyperlipidemia This is a chronic problem. The current episode started more than 1 year ago. Pertinent negatives include no chest pain, myalgias or shortness of breath. Current antihyperlipidemic treatment includes statins. Risk factors for coronary artery disease include diabetes mellitus, dyslipidemia, hypertension, obesity and a sedentary lifestyle.  Hypertension This is a chronic problem. The current episode started more than 1 year ago. Pertinent negatives include no chest pain, headaches, palpitations or shortness of breath. Risk factors for coronary artery disease include diabetes mellitus, dyslipidemia, obesity and sedentary lifestyle. Past treatments include ACE inhibitors. Hypertensive end-organ damage includes kidney disease.      Review of Systems  Constitutional: Negative for fever, chills and unexpected weight change.  HENT: Negative for trouble swallowing and voice change.   Eyes: Negative for visual disturbance.  Respiratory: Negative for cough, shortness of breath and wheezing.   Cardiovascular: Negative for chest pain, palpitations and leg swelling.  Gastrointestinal: Negative for nausea, vomiting and  diarrhea.  Endocrine: Negative for cold intolerance, heat intolerance, polydipsia, polyphagia and polyuria.  Musculoskeletal: Negative for myalgias and arthralgias.  Skin: Negative for color change, pallor, rash and wound.  Neurological: Negative for seizures and headaches.  Psychiatric/Behavioral: Negative for suicidal ideas and confusion.    Objective:    BP 136/82 mmHg  Pulse 80  Ht 5' 2"  (1.575 m)  Wt 197 lb (89.359 kg)  BMI 36.02 kg/m2  SpO2 97%  Wt Readings from Last 3 Encounters:  05/21/15 197 lb (89.359 kg)  05/21/15 197 lb (89.359 kg)  04/27/15 188 lb 3.2 oz (85.367 kg)    Physical Exam  Constitutional: She is oriented to person, place, and time. She appears well-developed.  HENT:  Head: Normocephalic and atraumatic.  Eyes: EOM are normal.  Neck: Normal range of motion. Neck supple. No tracheal deviation present. No thyromegaly present.  Cardiovascular: Normal rate and regular rhythm.   Pulmonary/Chest: Effort normal and breath sounds normal.  Abdominal: Soft. Bowel sounds are normal. There is no tenderness. There is no guarding.  Musculoskeletal: Normal range of motion. She exhibits no edema.  Neurological: She is alert and oriented to person, place, and time. She has normal reflexes. No cranial nerve deficit. Coordination normal.  Skin: Skin is warm and  dry. No rash noted. No erythema. No pallor.  Psychiatric: She has a normal mood and affect. Judgment normal.    Results for orders placed or performed in visit on 03/30/15  CMP14+EGFR  Result Value Ref Range   Glucose 150 (H) 65 - 99 mg/dL   BUN 34 (H) 8 - 27 mg/dL   Creatinine, Ser 1.59 (H) 0.57 - 1.00 mg/dL   GFR calc non Af Amer 33 (L) >59 mL/min/1.73   GFR calc Af Amer 38 (L) >59 mL/min/1.73   BUN/Creatinine Ratio 21 11 - 26   Sodium 142 136 - 144 mmol/L   Potassium 4.5 3.5 - 5.2 mmol/L   Chloride 101 97 - 106 mmol/L   CO2 23 18 - 29 mmol/L   Calcium 10.2 8.7 - 10.3 mg/dL   Total Protein 7.0 6.0 - 8.5  g/dL   Albumin 4.4 3.6 - 4.8 g/dL   Globulin, Total 2.6 1.5 - 4.5 g/dL   Albumin/Globulin Ratio 1.7 1.1 - 2.5   Bilirubin Total 0.4 0.0 - 1.2 mg/dL   Alkaline Phosphatase 43 39 - 117 IU/L   AST 23 0 - 40 IU/L   ALT 16 0 - 32 IU/L  Hepatitis C antibody  Result Value Ref Range   Hep C Virus Ab <0.1 0.0 - 0.9 s/co ratio  POCT glycosylated hemoglobin (Hb A1C)  Result Value Ref Range   Hemoglobin A1C 8.0    Complete Blood Count (Most recent): Lab Results  Component Value Date   WBC 7.9 01/04/2015   HCT 38.5 01/04/2015   Chemistry (most recent): Lab Results  Component Value Date   NA 142 03/30/2015   K 4.5 03/30/2015   CL 101 03/30/2015   CO2 23 03/30/2015   BUN 34* 03/30/2015   CREATININE 1.59* 03/30/2015   Diabetic Labs (most recent): Lab Results  Component Value Date   HGBA1C 8.0 03/30/2015   HGBA1C 7.3 01/04/2015   Lipid profile (most recent): Lab Results  Component Value Date   TRIG 249* 01/04/2015   CHOL 163 01/04/2015    Assessment & Plan:   1. Diabetes mellitus with stage 3 chronic kidney disease ([place]) - Patient has currently uncontrolled symptomatic type 2 DM since  69 years of age,  with most recent A1c of8 %. Recent labs reviewed.  - her diabetes is complicated by CK D stage 3 and patient remains at a high risk for more acute and chronic complications of diabetes which include CAD, CVA, CKD, retinopathy, and neuropathy. These are all discussed in detail with the patient.  - I have counseled the patient on diet management and weight loss, by adopting a carbohydrate restricted/protein rich diet.  - Suggestion is made for patient to avoid simple carbohydrates   from their diet including Cakes , Desserts, Ice Cream,  Soda (  diet and regular) , Sweet Tea , Candies,  Chips, Cookies, Artificial Sweeteners,   and "Sugar-free" Products . This will help patient to have stable blood glucose profile and potentially avoid unintended weight gain.  - I encouraged the  patient to switch to  unprocessed or minimally processed complex starch and increased protein intake (animal or plant source), fruits, and vegetables.  - Patient is advised to stick to a routine mealtimes to eat 3 meals  a day and avoid unnecessary snacks ( to snack only to correct hypoglycemia).  - The patient will be scheduled with Jearld Fenton, RDN, CDE for individualized DM education.  - I have approached patient with the following  individualized plan to manage diabetes and patient agrees:   - I  will initiate strict monitoring of glucose  AC and HS for a week. -She will likely need prandial insulin therapy after review of her BG readings and meter. - I will readjust her Lantus to 50 units qhs. -Patient is encouraged to call clinic for blood glucose levels less than 70 or above 300 mg /dl. - I will discontinue  Metformin for now due to CKD, and she is not a candidate for SGLT2 i. - Patient will be considered for incretin therapy as appropriate next visit. - Patient specific target  A1c;  LDL, HDL, Triglycerides, and  Waist Circumference were discussed in detail.  2) BP/HTN: Controlled. Continue current medications including ACEI/ARB. 3) Lipids/HPL:   continue statins. 4)  Weight/Diet: CDE Consult will be initiated , exercise, and detailed carbohydrates information provided.  5) Chronic Care/Health Maintenance:  -Patient is on ACEI/ARB and Statin medications and encouraged to continue to follow up with Ophthalmology, Podiatrist at least yearly or according to recommendations, and advised to  stay away from smoking. I have recommended yearly flu vaccine and pneumonia vaccination at least every 5 years; moderate intensity exercise for up to 150 minutes weekly; and  sleep for at least 7 hours a day.  - 60 minutes of time was spent on the care of this patient , 50% of which was applied for counseling on diabetes complications and their preventions.  - Patient to bring meter and  blood  glucose logs during their next visit.   - I advised patient to maintain close follow up with Kenn File, MD for primary care needs.  Follow up plan: - Return in about 10 days (around 05/31/2015) for follow up with meter and logs- no labs.  Glade Lloyd, MD Phone: [phone]  Fax: [phone]   05/22/2015, 8:43 AM

## 2015-05-24 ENCOUNTER — Ambulatory Visit: Payer: Medicare Other | Admitting: Family Medicine

## 2015-05-24 ENCOUNTER — Ambulatory Visit (INDEPENDENT_AMBULATORY_CARE_PROVIDER_SITE_OTHER): Payer: Medicare Other | Admitting: Family Medicine

## 2015-05-24 ENCOUNTER — Encounter: Payer: Self-pay | Admitting: Family Medicine

## 2015-05-24 VITALS — BP 120/84 | HR 82 | Temp 96.9°F | Ht 62.0 in | Wt 194.4 lb

## 2015-05-24 DIAGNOSIS — L03031 Cellulitis of right toe: Secondary | ICD-10-CM | POA: Diagnosis not present

## 2015-05-24 DIAGNOSIS — I889 Nonspecific lymphadenitis, unspecified: Secondary | ICD-10-CM | POA: Diagnosis not present

## 2015-05-24 DIAGNOSIS — B351 Tinea unguium: Secondary | ICD-10-CM | POA: Diagnosis not present

## 2015-05-24 MED ORDER — AZITHROMYCIN 250 MG PO TABS: ORAL_TABLET | ORAL | Status: DC

## 2015-05-24 MED ORDER — TRAMADOL HCL 50 MG PO TABS: 50.0000 mg | ORAL_TABLET | Freq: Four times a day (QID) | ORAL | Status: DC | PRN

## 2015-05-24 NOTE — Progress Notes (Signed)
   HPI  Patient presents today here today to discuss swollen lymph node and refill tramadol.  Lymph node She states that she's had a swollen left spot on her left neck for about 7 days, she started out with a small sore in her mouth that seems to have resolved but the swollen area has persisted. She denies sore throat. She has some referred pain to her left ear but no muffled hearing. No fever, chills, palpitations, leg edema.  Chronic pain She sees her for pain management for oxycodone, I been refilling her tramadol. She is changing pharmacies and states that her pharmacy will not transfer the tramadol.  PMH: Smoking status noted ROS: Per HPI  Objective: BP 120/84 mmHg  Pulse 82  Temp(Src) 96.9 F (36.1 C) (Oral)  Ht 5\' 2"  (1.575 m)  Wt 194 lb 6.4 oz (88.179 kg)  BMI 35.55 kg/m2 Gen: NAD, alert, cooperative with exam HEENT: NCAT, TMs normal bilaterally, nares clear, oropharynx with no enlarged tonsils, no sores appreciated on left posterior tongue, however I do see some bumps consistent with vallate papilla CV: RRR, good S1/S2, no murmur Resp: CTABL, no wheezes, non-labored  Ext: No edema, warm Neuro: Alert and oriented, No gross deficits  Assessment and plan:  # Lymphadenitis, swollen lymph node No signs of blocked salivary duct, no signs of sore in her tongue Considering duration and persistent symptoms I think it's reasonable to go ahead and treat with azithromycin to cover strep Discussed red flags for return including not improving  # Chronic pain Tramadol helping reduce amenorrhea oxycodone she requires Refilled 60 pills, 2 refills    Meds ordered this encounter  Medications  . azithromycin (ZITHROMAX) 250 MG tablet    Sig: Take 2 tablets on day 1 and 1 tablet daily after that    Dispense:  6 tablet    Refill:  0  . traMADol (ULTRAM) 50 MG tablet    Sig: Take 1 tablet (50 mg total) by mouth every 6 (six) hours as needed. for pain    Dispense:  60 tablet      Refill:  Biehle, MD [facility] 05/24/2015, 2:26 PM

## 2015-05-24 NOTE — Patient Instructions (Signed)
Great to see you!   I have prescribed azithromycin, please finish all pills.   If anything changes, you get worse, or if you do not get better please let me know.

## 2015-05-29 ENCOUNTER — Observation Stay (HOSPITAL_COMMUNITY)
Admission: EM | Admit: 2015-05-29 | Discharge: 2015-05-30 | Disposition: A | Discharge status: Home / Self Care | Payer: Medicare Other | Attending: Internal Medicine | Admitting: Internal Medicine

## 2015-05-29 ENCOUNTER — Emergency Department (HOSPITAL_COMMUNITY): Payer: Medicare Other

## 2015-05-29 ENCOUNTER — Encounter (HOSPITAL_COMMUNITY): Payer: Self-pay | Admitting: *Deleted

## 2015-05-29 ENCOUNTER — Ambulatory Visit (INDEPENDENT_AMBULATORY_CARE_PROVIDER_SITE_OTHER): Payer: Medicare Other | Admitting: Family Medicine

## 2015-05-29 ENCOUNTER — Encounter: Payer: Self-pay | Admitting: Family Medicine

## 2015-05-29 VITALS — BP 177/124 | HR 107 | Temp 98.4°F | Ht 62.0 in | Wt 205.4 lb

## 2015-05-29 DIAGNOSIS — Z7901 Long term (current) use of anticoagulants: Secondary | ICD-10-CM

## 2015-05-29 DIAGNOSIS — F319 Bipolar disorder, unspecified: Secondary | ICD-10-CM | POA: Diagnosis not present

## 2015-05-29 DIAGNOSIS — I4891 Unspecified atrial fibrillation: Secondary | ICD-10-CM | POA: Diagnosis not present

## 2015-05-29 DIAGNOSIS — R0600 Dyspnea, unspecified: Secondary | ICD-10-CM | POA: Diagnosis not present

## 2015-05-29 DIAGNOSIS — G8929 Other chronic pain: Secondary | ICD-10-CM | POA: Diagnosis not present

## 2015-05-29 DIAGNOSIS — I509 Heart failure, unspecified: Secondary | ICD-10-CM

## 2015-05-29 DIAGNOSIS — I5021 Acute systolic (congestive) heart failure: Secondary | ICD-10-CM | POA: Diagnosis not present

## 2015-05-29 DIAGNOSIS — I482 Chronic atrial fibrillation, unspecified: Secondary | ICD-10-CM | POA: Diagnosis present

## 2015-05-29 DIAGNOSIS — Z79899 Other long term (current) drug therapy: Secondary | ICD-10-CM | POA: Insufficient documentation

## 2015-05-29 DIAGNOSIS — E785 Hyperlipidemia, unspecified: Secondary | ICD-10-CM | POA: Insufficient documentation

## 2015-05-29 DIAGNOSIS — E1165 Type 2 diabetes mellitus with hyperglycemia: Secondary | ICD-10-CM | POA: Diagnosis present

## 2015-05-29 DIAGNOSIS — I5032 Chronic diastolic (congestive) heart failure: Secondary | ICD-10-CM | POA: Diagnosis present

## 2015-05-29 DIAGNOSIS — R0602 Shortness of breath: Secondary | ICD-10-CM

## 2015-05-29 DIAGNOSIS — Z23 Encounter for immunization: Secondary | ICD-10-CM | POA: Insufficient documentation

## 2015-05-29 DIAGNOSIS — E119 Type 2 diabetes mellitus without complications: Secondary | ICD-10-CM | POA: Diagnosis not present

## 2015-05-29 DIAGNOSIS — R0789 Other chest pain: Secondary | ICD-10-CM | POA: Diagnosis not present

## 2015-05-29 DIAGNOSIS — R079 Chest pain, unspecified: Secondary | ICD-10-CM | POA: Diagnosis not present

## 2015-05-29 DIAGNOSIS — I110 Hypertensive heart disease with heart failure: Secondary | ICD-10-CM | POA: Diagnosis not present

## 2015-05-29 DIAGNOSIS — E1143 Type 2 diabetes mellitus with diabetic autonomic (poly)neuropathy: Secondary | ICD-10-CM | POA: Diagnosis present

## 2015-05-29 DIAGNOSIS — Z8249 Family history of ischemic heart disease and other diseases of the circulatory system: Secondary | ICD-10-CM

## 2015-05-29 DIAGNOSIS — R0609 Other forms of dyspnea: Secondary | ICD-10-CM | POA: Insufficient documentation

## 2015-05-29 DIAGNOSIS — I10 Essential (primary) hypertension: Secondary | ICD-10-CM | POA: Diagnosis present

## 2015-05-29 DIAGNOSIS — E1159 Type 2 diabetes mellitus with other circulatory complications: Secondary | ICD-10-CM | POA: Diagnosis present

## 2015-05-29 DIAGNOSIS — I4821 Permanent atrial fibrillation: Secondary | ICD-10-CM | POA: Diagnosis present

## 2015-05-29 DIAGNOSIS — M549 Dorsalgia, unspecified: Secondary | ICD-10-CM

## 2015-05-29 HISTORY — DX: Pain, unspecified: R52

## 2015-05-29 HISTORY — DX: Other chronic pain: G89.29

## 2015-05-29 HISTORY — DX: Dorsalgia, unspecified: M54.9

## 2015-05-29 LAB — BASIC METABOLIC PANEL
Anion gap: 9 (ref 5–15)
BUN: 30 mg/dL — ABNORMAL HIGH (ref 6–20)
CO2: 27 mmol/L (ref 22–32)
Calcium: 9.9 mg/dL (ref 8.9–10.3)
Chloride: 103 mmol/L (ref 101–111)
Creatinine, Ser: 1.1 mg/dL — ABNORMAL HIGH (ref 0.44–1.00)
GFR calc Af Amer: 59 mL/min — ABNORMAL LOW (ref >60)
GFR calc non Af Amer: 51 mL/min — ABNORMAL LOW (ref >60)
Glucose, Bld: 175 mg/dL — ABNORMAL HIGH (ref 65–99)
Potassium: 4.3 mmol/L (ref 3.5–5.1)
Sodium: 139 mmol/L (ref 135–145)

## 2015-05-29 LAB — CBC
HCT: 37.3 % (ref 36.0–46.0)
Hemoglobin: 11.9 g/dL — ABNORMAL LOW (ref 12.0–15.0)
MCH: 28.8 pg (ref 26.0–34.0)
MCHC: 31.9 g/dL (ref 30.0–36.0)
MCV: 90.3 fL (ref 78.0–100.0)
Platelets: 233 10*3/uL (ref 150–400)
RBC: 4.13 MIL/uL (ref 3.87–5.11)
RDW: 14.2 % (ref 11.5–15.5)
WBC: 13.7 10*3/uL — ABNORMAL HIGH (ref 4.0–10.5)

## 2015-05-29 LAB — TROPONIN I: Troponin I: <0.03 ng/mL (ref <0.031)

## 2015-05-29 LAB — BRAIN NATRIURETIC PEPTIDE: B Natriuretic Peptide: 692 pg/mL — ABNORMAL HIGH (ref 0.0–100.0)

## 2015-05-29 MED ORDER — NITROGLYCERIN 0.4 MG SL SUBL
0.4000 mg | SUBLINGUAL_TABLET | SUBLINGUAL | Status: DC | PRN
  Administered 2015-05-29: 0.4 mg via SUBLINGUAL
  Filled 2015-05-29: qty 1

## 2015-05-29 MED ORDER — ASPIRIN 81 MG PO CHEW
324.0000 mg | CHEWABLE_TABLET | Freq: Once | ORAL | Status: AC
  Administered 2015-05-29: 324 mg via ORAL
  Filled 2015-05-29: qty 4

## 2015-05-29 MED ORDER — FUROSEMIDE 10 MG/ML IJ SOLN
40.0000 mg | Freq: Once | INTRAMUSCULAR | Status: AC
  Administered 2015-05-29: 40 mg via INTRAVENOUS
  Filled 2015-05-29: qty 4

## 2015-05-29 MED ORDER — NITROGLYCERIN 2 % TD OINT
1.0000 [in_us] | TOPICAL_OINTMENT | Freq: Once | TRANSDERMAL | Status: AC
  Administered 2015-05-29: 1 [in_us] via TOPICAL
  Filled 2015-05-29: qty 1

## 2015-05-29 MED ORDER — MORPHINE SULFATE (PF) 4 MG/ML IV SOLN
4.0000 mg | INTRAVENOUS | Status: AC | PRN
  Administered 2015-05-29 (×2): 4 mg via INTRAVENOUS
  Filled 2015-05-29 (×2): qty 1

## 2015-05-29 NOTE — Progress Notes (Signed)
Subjective:    Patient ID: Amber Stephenson, female    DOB: 1945/08/13, 69 y.o.   MRN: [id number]  HPI 69 year old female with onset of shortness of breath last night. She denies any cough. She does have a history of atrial fibrillation takes ELiquis and Lopressor as result. She's not had any fever or chills. There is no dependent edema.   Patient Active Problem List   Diagnosis Date Noted  . RLS (restless legs syndrome) 04/27/2015  . Fall 02/26/2015  . Back pain 02/26/2015  . Acute low back pain due to trauma 02/26/2015  . Lipoma 02/08/2015  . Bipolar disorder ([place]) 01/23/2015  . Insomnia 01/23/2015  . CKD stage 3 due to type 2 diabetes mellitus ([place]) 01/05/2015  . Chronic back pain 01/04/2015  . Atrial fibrillation ([place]) 01/04/2015  . Pruritus 01/04/2015  . Diabetes mellitus with stage 3 chronic kidney disease ([place])   . Hyperlipidemia   . Hypertension    Outpatient Encounter Prescriptions as of 05/29/2015  Medication Sig  . apixaban (ELIQUIS) 5 MG TABS tablet Take 5 mg by mouth 2 (two) times daily.  . ARIPiprazole (ABILIFY) 15 MG tablet Take 1 tablet (15 mg total) by mouth daily.  Marland Kitchen atorvastatin (LIPITOR) 40 MG tablet Take 1 tablet (40 mg total) by mouth daily.  Marland Kitchen azithromycin (ZITHROMAX) 250 MG tablet Take 2 tablets on day 1 and 1 tablet daily after that  . Blood Glucose Monitoring Suppl (FREESTYLE LITE) DEVI Use to check blood sugar tid. DX E11.22  . Cholecalciferol (VITAMIN D3) 1000 UNITS CAPS Take 1,000 Units by mouth daily.  . fenofibrate (TRICOR) 145 MG tablet Take 145 mg by mouth daily.  . furosemide (LASIX) 20 MG tablet Take 20 mg by mouth 2 (two) times daily.  Marland Kitchen glucose blood (FREESTYLE LITE) test strip Test tid. DX E11.22  . Insulin Glargine (LANTUS SOLOSTAR) 100 UNIT/ML Solostar Pen Inject 50 Units into the skin at bedtime.  . Lancets (FREESTYLE) lancets Test tid. DX E11.22  . lidocaine (LIDODERM) 5 % Place 1 patch onto the skin 2 (two) times daily.  Marland Kitchen lisinopril  (ZESTRIL) 2.5 MG tablet Take 1 tablet (2.5 mg total) by mouth daily.  . metoprolol (LOPRESSOR) 100 MG tablet Take 1 tablet (100 mg total) by mouth 2 (two) times daily.  Marland Kitchen oxyCODONE-acetaminophen (ROXICET) 5-325 MG per tablet Take 1 tablet by mouth every 8 (eight) hours as needed for severe pain.  . potassium chloride (K-DUR,KLOR-CON) 10 MEQ tablet TAKE 1 TABLET 2 TIMES DAILY WITH FOOD  . pregabalin (LYRICA) 50 MG capsule 1 pill morning and afternoon, 2 pills before bed  . rizatriptan (MAXALT-MLT) 10 MG disintegrating tablet DISSOLVE 1 TABLET SUBLINGUALLY IMMEDIATELY, MAY REPEAT IN 1 HOUR IF NEEDED (MAX OF 3 TABS/24 HOURS)  . rOPINIRole (REQUIP) 0.25 MG tablet Take 1 tablet (0.25 mg total) by mouth at bedtime.  . SUMAtriptan (IMITREX) 100 MG tablet Take 1 tablet (100 mg total) by mouth See admin instructions.  Marland Kitchen tiZANidine (ZANAFLEX) 4 MG tablet Take 1 tablet (4 mg total) by mouth 2 (two) times daily.  . traMADol (ULTRAM) 50 MG tablet Take 1 tablet (50 mg total) by mouth every 6 (six) hours as needed. for pain   No facility-administered encounter medications on file as of 05/29/2015.      Review of Systems  Constitutional: Negative.   Respiratory: Positive for shortness of breath.   Cardiovascular: Positive for chest pain.  Neurological: Negative.   Psychiatric/Behavioral: Negative.  Objective:   Physical Exam  Constitutional: She is oriented to person, place, and time. She appears well-developed and well-nourished.  Cardiovascular:  Heart is tachycardic and irregular consistent with atrial fibrillation  Pulmonary/Chest: Effort normal and breath sounds normal.  Neurological: She is alert and oriented to person, place, and time.          Assessment & Plan:  1. Shortness of breath I believe her shortness of breath may be related to some strain on her heart due to the hypertension. Her lungs are clear. Her oxygen saturation is 94%. She has no overt symptoms of failure such as  dependent edema. I believe if we could lower her blood pressure after shortness of breath may improve but since we do not have clonidine available or other medicines to lower blood pressure will have to send her to the emergency room at [facility] for further treatment.  Wardell Honour MD

## 2015-05-29 NOTE — H&P (Signed)
[facility]                                                                                    Patient Demographics  Amber Stephenson, is a 69 y.o. female  CSN: [id number]  MRN: [id number]  DOB - 05-06-1946  Admit Date - 05/29/2015  Outpatient Primary MD for the patient is Kenn File, MD   With History of -  Past Medical History  Diagnosis Date  . Diabetes mellitus without complication ([place])   . Hyperlipidemia   . Hypertension   . Bipolar affective ([place])   . Atrial fibrillation ([place])   . Chronic back pain   . Pain management       Past Surgical History  Procedure Laterality Date  . Thigh surgery    . Shoulder surgery Right   . Breast reduction surgery      in for   Chief Complaint  Patient presents with  . Chest Pain     HPI  Amber Stephenson  is a 69 y.o. female, with past medical history significant for diabetes mellitus hypertension and atrial fibrillation presenting today with chest pain that started at 4 PM 8 is retrosternal nonradiating 4/10 originally and now down to 3/10. The chest pain is not pleuritic and no associated nausea or cold sweats. Patient reports shortness of breath though No exacerbating or relieving factors noted. Her heart rate is 100 bpm and her BNP was elevated, so she received Lasix in the emergency room. The chest pain was continuous, described as heaviness, and not associated with dizziness or loss of consciousness . Patient had an echocardiogram done on 9 /2016 which showed a normal ejection fraction with no significant diastolic dysfunction. The patient recently moved from [place]    In addition to the HPI above,  No Fever-chills, No Headache, No changes with Vision or hearing, No problems swallowing food or Liquids, No Abdominal pain, No Nausea or Vommitting, Bowel movements are regular, No Blood in stool or Urine, No dysuria, No new skin rashes or bruises, No new joints pains-aches,   No new weakness, tingling, numbness in any extremity, No recent weight gain or loss, No polyuria, polydypsia or polyphagia, No significant Mental Stressors.  A full 10 point Review of Systems was done, except as stated above, all other Review of Systems were negative.   Social History Social History  Substance Use Topics  . Smoking status: Never Smoker   . Smokeless tobacco: Not on file  . Alcohol Use: No     Family History Family History  Problem Relation Age of Onset  . Diabetes Mother   . Heart disease Mother   . Heart disease Brother   . Heart disease Sister     CABG     Prior to Admission medications   Medication Sig Start Date End Date Taking? Authorizing Provider  apixaban (ELIQUIS) 5 MG TABS tablet Take 5 mg by mouth 2 (two) times daily.   Yes Historical Provider, MD  ARIPiprazole (ABILIFY) 15 MG tablet Take 1 tablet (15 mg total) by mouth daily. 04/13/15  Yes Timmothy Euler, MD  atorvastatin (LIPITOR) 40  MG tablet Take 1 tablet (40 mg total) by mouth daily. 02/08/15  Yes Timmothy Euler, MD  Cholecalciferol (VITAMIN D3) 1000 UNITS CAPS Take 1,000 Units by mouth daily.   Yes Historical Provider, MD  fenofibrate (TRICOR) 145 MG tablet Take 145 mg by mouth daily.   Yes Historical Provider, MD  furosemide (LASIX) 20 MG tablet Take 20 mg by mouth 2 (two) times daily.   Yes Historical Provider, MD  ibuprofen (ADVIL,MOTRIN) 800 MG tablet TAKE ONE TABLET BY MOUTH 3 TIMES DAILY AS NEEDED FOR PAIN 05/17/15  Yes Historical Provider, MD  Insulin Glargine (LANTUS SOLOSTAR) 100 UNIT/ML Solostar Pen Inject 56 Units into the skin at bedtime.    Yes Historical Provider, MD  lidocaine (LIDODERM) 5 % Place 1 patch onto the skin 2 (two) times daily. 02/08/15  Yes Timmothy Euler, MD  lisinopril (ZESTRIL) 2.5 MG tablet Take 1 tablet (2.5 mg total) by mouth daily. 04/25/15  Yes Timmothy Euler, MD  metoprolol (LOPRESSOR) 100 MG tablet Take 1 tablet (100 mg total) by mouth 2 (two)  times daily. 04/16/15  Yes Timmothy Euler, MD  Multiple Vitamin (MULTIVITAMIN WITH MINERALS) TABS tablet Take 1 tablet by mouth daily.   Yes Historical Provider, MD  oxyCODONE-acetaminophen (PERCOCET) 10-325 MG tablet TAKE ONE TABLET BY MOUTH FOUR TIMES A DAY AS NEEDED FOR 30 DAYS 05/11/15  Yes Historical Provider, MD  potassium chloride (K-DUR,KLOR-CON) 10 MEQ tablet TAKE 1 TABLET 2 TIMES DAILY WITH FOOD 11/06/14  Yes Historical Provider, MD  pregabalin (LYRICA) 50 MG capsule 1 pill morning and afternoon, 2 pills before bed Patient taking differently: Take 50-100 mg by mouth 2 (two) times daily. 1 pill morning and afternoon, 2 pills before bed 04/16/15  Yes Timmothy Euler, MD  SUMAtriptan (IMITREX) 100 MG tablet Take 1 tablet (100 mg total) by mouth See admin instructions. 03/20/15  Yes Timmothy Euler, MD  tiZANidine (ZANAFLEX) 4 MG tablet Take 1 tablet (4 mg total) by mouth 2 (two) times daily. 05/11/15  Yes Timmothy Euler, MD  traMADol (ULTRAM) 50 MG tablet Take 1 tablet (50 mg total) by mouth every 6 (six) hours as needed. for pain 05/24/15  Yes Timmothy Euler, MD  zaleplon (SONATA) 10 MG capsule Take 20 mg by mouth at bedtime.   Yes Historical Provider, MD  Blood Glucose Monitoring Suppl (FREESTYLE LITE) DEVI Use to check blood sugar tid. DX E11.22 04/04/15   Timmothy Euler, MD  glucose blood (FREESTYLE LITE) test strip Test tid. DX E11.22 04/04/15   Timmothy Euler, MD  Lancets (FREESTYLE) lancets Test tid. DX E11.22 04/04/15   Timmothy Euler, MD    Allergies  Allergen Reactions  . Ivp Dye [Iodinated Diagnostic Agents] Swelling    Throat closes    Physical Exam  Vitals  Blood pressure 150/122, pulse 104, temperature 98.2 F (36.8 C), resp. rate 28, height 5\' 2"  (1.575 m), weight 92.987 kg (205 lb), SpO2 97 %.   1. General an extremely pleasant female in moderate pain  2. Normal affect and insight, Not Suicidal or Homicidal, Awake Alert, Oriented X 3.  3. No F.N  deficits, grossly patient moving all extremities.  4. Ears and Eyes appear Normal, Conjunctivae clear, PERRLA. Moist Oral Mucosa.  5. Supple Neck, No JVD, No cervical lymphadenopathy appriciated, No Carotid Bruits.  6. Symmetrical Chest wall movement, Good air movement bilaterally, bilateral basilar crackles.  7. Irregularly irregular, No Gallops, Rubs or Murmurs, No Parasternal Heave.  8. Positive Bowel Sounds, Abdomen Soft, Non tender, No organomegaly appriciated,No rebound -guarding or rigidity.  9.  No Cyanosis, Normal Skin Turgor, No Skin Rash or Bruise.  10. Good muscle tone,  joints appear normal , no effusions, Normal ROM.    Data Review  CBC  Recent Labs Lab 05/29/15 2010  WBC 13.7*  HGB 11.9*  HCT 37.3  PLT 233  MCV 90.3  MCH 28.8  MCHC 31.9  RDW 14.2   ------------------------------------------------------------------------------------------------------------------  Chemistries   Recent Labs Lab 05/29/15 2010  NA 139  K 4.3  CL 103  CO2 27  GLUCOSE 175*  BUN 30*  CREATININE 1.10*  CALCIUM 9.9   ------------------------------------------------------------------------------------------------------------------ estimated creatinine clearance is 52.7 mL/min (by C-G formula based on Cr of 1.1). ------------------------------------------------------------------------------------------------------------------ No results for input(s): TSH, T4TOTAL, T3FREE, THYROIDAB in the last 72 hours.  Invalid input(s): FREET3   Coagulation profile No results for input(s): INR, PROTIME in the last 168 hours. ------------------------------------------------------------------------------------------------------------------- No results for input(s): DDIMER in the last 72 hours. -------------------------------------------------------------------------------------------------------------------  Cardiac Enzymes  Recent Labs Lab 05/29/15 2010  TROPONINI <0.03    ------------------------------------------------------------------------------------------------------------------ Invalid input(s): POCBNP   ---------------------------------------------------------------------------------------------------------------  Urinalysis No results found for: COLORURINE, APPEARANCEUR, LABSPEC, PHURINE, GLUCOSEU, HGBUR, BILIRUBINUR, KETONESUR, PROTEINUR, UROBILINOGEN, NITRITE, LEUKOCYTESUR  ----------------------------------------------------------------------------------------------------------------     Imaging results:   Dg Chest 2 View  05/29/2015  CLINICAL DATA:  Chest pain and shortness of Breath EXAM: CHEST - 2 VIEW COMPARISON:  None. FINDINGS: Cardiac shadow is enlarged. The lungs are well aerated bilaterally. Some patchy atelectatic changes are noted in the left lingula. No sizable effusion is seen. No bony abnormality is noted. IMPRESSION: Left basilar atelectasis. Electronically Signed   By: Inez Catalina M.D.   On: 05/29/2015 18:43    My personal review of EKG: Atrial fibrillation at 100 bpm, abnormal R-wave progression    Assessment & Plan  1. Chest pain : Rule out MI     No previous EKG to compare     Last echocardiogram was done on 02/2015 2. Atrial fibrillation     Heart rate controlled 3. Hypertension     Add Lopressor IV PRN 4.DM ISS 5. Bipolar D/O     C/W Meds   DVT Prophylaxis   AM Labs Ordered, also please review Full Orders  Code Status full  Disposition Plan: Home  Time spent in minutes : 44 minutes  Condition GUARDED   @SIGNATURE @

## 2015-05-29 NOTE — ED Provider Notes (Signed)
CSN: [id number]     Arrival date & time 05/29/15  1710 History   First MD Initiated Contact with Patient 05/29/15 1956     Chief Complaint  Patient presents with  . Chest Pain      HPI Pt was seen at 2005. Per pt, c/o gradual onset and worsening of persistent SOB and CP since yesterday. Pt states the SOB began yesterday, worsens when she lays down to sleep. Pt states CP began this afternoon. Describes the CP as "aching" and "heaviness," worsens with exertion and improves with rest. Pt states she has "gained 10lbs" over the past 4 days. Pt also endorses not taking her medications for 2 days last week. Denies palpitations, no cough, no abd pain, no N/V/D, no back pain, no fevers, no rash.     Past Medical History  Diagnosis Date  . Diabetes mellitus without complication ([place])   . Hyperlipidemia   . Hypertension   . Bipolar affective ([place])   . Atrial fibrillation ([place])   . Chronic back pain   . Pain management    Past Surgical History  Procedure Laterality Date  . Thigh surgery    . Shoulder surgery Right   . Breast reduction surgery     Family History  Problem Relation Age of Onset  . Diabetes Mother   . Heart disease Mother   . Heart disease Brother   . Heart disease Sister     CABG   Social History  Substance Use Topics  . Smoking status: Never Smoker   . Smokeless tobacco: None  . Alcohol Use: No    Review of Systems ROS: Statement: All systems negative except as marked or noted in the HPI; Constitutional: Negative for fever and chills. ; ; Eyes: Negative for eye pain, redness and discharge. ; ; ENMT: Negative for ear pain, hoarseness, nasal congestion, sinus pressure and sore throat. ; ; Cardiovascular: +CP, SOB. Negative for palpitations, diaphoresis, and peripheral edema. ; ; Respiratory: Negative for cough, wheezing and stridor. ; ; Gastrointestinal: Negative for nausea, vomiting, diarrhea, abdominal pain, blood in stool, hematemesis, jaundice and rectal bleeding. .  ; ; Genitourinary: Negative for dysuria, flank pain and hematuria. ; ; Musculoskeletal: Negative for back pain and neck pain. Negative for swelling and trauma.; ; Skin: Negative for pruritus, rash, abrasions, blisters, bruising and skin lesion.; ; Neuro: Negative for headache, lightheadedness and neck stiffness. Negative for weakness, altered level of consciousness , altered mental status, extremity weakness, paresthesias, involuntary movement, seizure and syncope.      Allergies  Ivp dye  Home Medications   Prior to Admission medications   Medication Sig Start Date End Date Taking? Authorizing Provider  apixaban (ELIQUIS) 5 MG TABS tablet Take 5 mg by mouth 2 (two) times daily.    Historical Provider, MD  ARIPiprazole (ABILIFY) 15 MG tablet Take 1 tablet (15 mg total) by mouth daily. 04/13/15   Timmothy Euler, MD  atorvastatin (LIPITOR) 40 MG tablet Take 1 tablet (40 mg total) by mouth daily. 02/08/15   Timmothy Euler, MD  azithromycin (ZITHROMAX) 250 MG tablet Take 2 tablets on day 1 and 1 tablet daily after that 05/24/15   Timmothy Euler, MD  Blood Glucose Monitoring Suppl (FREESTYLE LITE) DEVI Use to check blood sugar tid. DX E11.22 04/04/15   Timmothy Euler, MD  Cholecalciferol (VITAMIN D3) 1000 UNITS CAPS Take 1,000 Units by mouth daily.    Historical Provider, MD  fenofibrate (TRICOR) 145 MG tablet Take 145  mg by mouth daily.    Historical Provider, MD  furosemide (LASIX) 20 MG tablet Take 20 mg by mouth 2 (two) times daily.    Historical Provider, MD  glucose blood (FREESTYLE LITE) test strip Test tid. DX E11.22 04/04/15   Timmothy Euler, MD  Insulin Glargine (LANTUS SOLOSTAR) 100 UNIT/ML Solostar Pen Inject 50 Units into the skin at bedtime.    Historical Provider, MD  Lancets (FREESTYLE) lancets Test tid. DX E11.22 04/04/15   Timmothy Euler, MD  lidocaine (LIDODERM) 5 % Place 1 patch onto the skin 2 (two) times daily. 02/08/15   Timmothy Euler, MD  lisinopril (ZESTRIL)  2.5 MG tablet Take 1 tablet (2.5 mg total) by mouth daily. 04/25/15   Timmothy Euler, MD  metoprolol (LOPRESSOR) 100 MG tablet Take 1 tablet (100 mg total) by mouth 2 (two) times daily. 04/16/15   Timmothy Euler, MD  oxyCODONE-acetaminophen (ROXICET) 5-325 MG per tablet Take 1 tablet by mouth every 8 (eight) hours as needed for severe pain. 02/26/15   Timmothy Euler, MD  potassium chloride (K-DUR,KLOR-CON) 10 MEQ tablet TAKE 1 TABLET 2 TIMES DAILY WITH FOOD 11/06/14   Historical Provider, MD  pregabalin (LYRICA) 50 MG capsule 1 pill morning and afternoon, 2 pills before bed 04/16/15   Timmothy Euler, MD  rizatriptan (MAXALT-MLT) 10 MG disintegrating tablet DISSOLVE 1 TABLET SUBLINGUALLY IMMEDIATELY, MAY REPEAT IN 1 HOUR IF NEEDED (MAX OF 3 TABS/24 HOURS) 01/25/15   Timmothy Euler, MD  rOPINIRole (REQUIP) 0.25 MG tablet Take 1 tablet (0.25 mg total) by mouth at bedtime. 05/09/15   Timmothy Euler, MD  SUMAtriptan (IMITREX) 100 MG tablet Take 1 tablet (100 mg total) by mouth See admin instructions. 03/20/15   Timmothy Euler, MD  tiZANidine (ZANAFLEX) 4 MG tablet Take 1 tablet (4 mg total) by mouth 2 (two) times daily. 05/11/15   Timmothy Euler, MD  traMADol (ULTRAM) 50 MG tablet Take 1 tablet (50 mg total) by mouth every 6 (six) hours as needed. for pain 05/24/15   Timmothy Euler, MD   BP 161/104 mmHg  Pulse 110  Temp(Src) 98.2 F (36.8 C)  Resp 21  Ht 5\' 2"  (1.575 m)  Wt 205 lb (92.987 kg)  BMI 37.49 kg/m2  SpO2 98% Physical Exam  2010: Physical examination:  Nursing notes reviewed; Vital signs and O2 SAT reviewed;  Constitutional: Well developed, Well nourished, Well hydrated, In no acute distress; Head:  Normocephalic, atraumatic; Eyes: EOMI, PERRL, No scleral icterus; ENMT: Mouth and pharynx normal, Mucous membranes moist; Neck: Supple, Full range of motion, No lymphadenopathy; Cardiovascular: Tachycardic rate and irregular rhythm, No gallop; Respiratory: Breath sounds  clear & equal bilaterally, No wheezes.  Speaking full sentences with ease, Normal respiratory effort/excursion; Chest: Nontender, Movement normal; Abdomen: Soft, Nontender, Nondistended, Normal bowel sounds; Genitourinary: No CVA tenderness; Extremities: Pulses normal, No tenderness, No edema, No calf edema or asymmetry.; Neuro: AA&Ox3, Major CN grossly intact.  Speech clear. No gross focal motor or sensory deficits in extremities.; Skin: Color normal, Warm, Dry.; Psych:  Anxious.    ED Course  Procedures (including critical care time) Labs Review   Imaging Review  I have personally reviewed and evaluated these images and lab results as part of my medical decision-making.   EKG Interpretation   Date/Time:  Tuesday May 29 2015 19:48:46 EST Ventricular Rate:  100 PR Interval:    QRS Duration: 68 QT Interval:  315 QTC Calculation: 406 R Axis:   -  11 Text Interpretation:  Atrial fibrillation Abnormal R-wave progression,  early transition T wave abnormality Lateral leads No old tracing to  compare Confirmed by [facility]  MD, Nunzio Cory [phone]) on 05/29/2015 8:25:06  PM      MDM  MDM Reviewed: previous chart, nursing note and vitals Interpretation: labs, ECG and x-ray     Results for orders placed or performed during the hospital encounter of [id number]  Basic metabolic panel  Result Value Ref Range   Sodium 139 135 - 145 mmol/L   Potassium 4.3 3.5 - 5.1 mmol/L   Chloride 103 101 - 111 mmol/L   CO2 27 22 - 32 mmol/L   Glucose, Bld 175 (H) 65 - 99 mg/dL   BUN 30 (H) 6 - 20 mg/dL   Creatinine, Ser 1.10 (H) 0.44 - 1.00 mg/dL   Calcium 9.9 8.9 - 10.3 mg/dL   GFR calc non Af Amer 51 (L) >60 mL/min   GFR calc Af Amer 59 (L) >60 mL/min   Anion gap 9 5 - 15  CBC  Result Value Ref Range   WBC 13.7 (H) 4.0 - 10.5 K/uL   RBC 4.13 3.87 - 5.11 MIL/uL   Hemoglobin 11.9 (L) 12.0 - 15.0 g/dL   HCT 37.3 36.0 - 46.0 %   MCV 90.3 78.0 - 100.0 fL   MCH 28.8 26.0 - 34.0 pg   MCHC 31.9  30.0 - 36.0 g/dL   RDW 14.2 11.5 - 15.5 %   Platelets 233 150 - 400 K/uL  Troponin I  Result Value Ref Range   Troponin I <0.03 <0.031 ng/mL  Brain natriuretic peptide  Result Value Ref Range   B Natriuretic Peptide 692.0 (H) 0.0 - 100.0 pg/mL   Dg Chest 2 View 05/29/2015  CLINICAL DATA:  Chest pain and shortness of Breath EXAM: CHEST - 2 VIEW COMPARISON:  None. FINDINGS: Cardiac shadow is enlarged. The lungs are well aerated bilaterally. Some patchy atelectatic changes are noted in the left lingula. No sizable effusion is seen. No bony abnormality is noted. IMPRESSION: Left basilar atelectasis. Electronically Signed   By: Inez Catalina M.D.   On: 05/29/2015 18:43    2100:  Pt given ASA, SL ntg and IV morphine with improvement in symptoms. BNP elevated, no old to compare; IV lasix given as well as topical ntg applied. Pt requesting foley catheter because when she attempted to get on bedpan, she c/o increasing SOB and HR increased to 150's; will place foley. Dx and testing d/w pt and family.  Questions answered.  Verb understanding, agreeable to admit. T/C to [facility]. Laren Everts, case discussed, including:  HPI, pertinent PM/SHx, VS/PE, dx testing, ED course and treatment:  Agreeable to admit, requests to write temporary orders, obtain tele bed to team APAdmits.     Francine Graven, DO 05/31/15 2018

## 2015-05-29 NOTE — ED Notes (Signed)
Pt reports 10 pound weight gain since Friday. HX of afib, currently afib, heart rate 93.

## 2015-05-29 NOTE — ED Notes (Signed)
Pt went to PCP today for shortness of breath and chest pain, sent here for evaluation. Short of breath since yesterday and chest pain started today. Pt alert and talking in complete sentences.

## 2015-05-29 NOTE — ED Notes (Addendum)
Pt expressed dyspnea upon putting her on bedpan, HR went up into the 150s. MD Thurnell Garbe notified.

## 2015-05-30 ENCOUNTER — Encounter (HOSPITAL_COMMUNITY): Payer: Self-pay | Admitting: *Deleted

## 2015-05-30 ENCOUNTER — Other Ambulatory Visit: Payer: Self-pay | Admitting: Cardiology

## 2015-05-30 ENCOUNTER — Other Ambulatory Visit: Payer: Self-pay | Admitting: *Deleted

## 2015-05-30 DIAGNOSIS — F319 Bipolar disorder, unspecified: Secondary | ICD-10-CM | POA: Diagnosis not present

## 2015-05-30 DIAGNOSIS — E785 Hyperlipidemia, unspecified: Secondary | ICD-10-CM

## 2015-05-30 DIAGNOSIS — M549 Dorsalgia, unspecified: Secondary | ICD-10-CM

## 2015-05-30 DIAGNOSIS — I10 Essential (primary) hypertension: Secondary | ICD-10-CM

## 2015-05-30 DIAGNOSIS — I5032 Chronic diastolic (congestive) heart failure: Secondary | ICD-10-CM | POA: Diagnosis present

## 2015-05-30 DIAGNOSIS — G8929 Other chronic pain: Secondary | ICD-10-CM

## 2015-05-30 DIAGNOSIS — R079 Chest pain, unspecified: Secondary | ICD-10-CM

## 2015-05-30 DIAGNOSIS — N182 Chronic kidney disease, stage 2 (mild): Secondary | ICD-10-CM

## 2015-05-30 DIAGNOSIS — I5033 Acute on chronic diastolic (congestive) heart failure: Secondary | ICD-10-CM | POA: Diagnosis not present

## 2015-05-30 DIAGNOSIS — E1122 Type 2 diabetes mellitus with diabetic chronic kidney disease: Secondary | ICD-10-CM

## 2015-05-30 DIAGNOSIS — Z7901 Long term (current) use of anticoagulants: Secondary | ICD-10-CM

## 2015-05-30 DIAGNOSIS — Z8249 Family history of ischemic heart disease and other diseases of the circulatory system: Secondary | ICD-10-CM

## 2015-05-30 DIAGNOSIS — R072 Precordial pain: Secondary | ICD-10-CM

## 2015-05-30 DIAGNOSIS — I482 Chronic atrial fibrillation: Secondary | ICD-10-CM

## 2015-05-30 HISTORY — DX: Chronic diastolic (congestive) heart failure: I50.32

## 2015-05-30 HISTORY — DX: Family history of ischemic heart disease and other diseases of the circulatory system: Z82.49

## 2015-05-30 HISTORY — DX: Long term (current) use of anticoagulants: Z79.01

## 2015-05-30 LAB — MAGNESIUM: Magnesium: 1.8 mg/dL (ref 1.7–2.4)

## 2015-05-30 LAB — TROPONIN I
Troponin I: <0.03 ng/mL (ref <0.031)
Troponin I: <0.03 ng/mL (ref <0.031)

## 2015-05-30 LAB — CBC
HCT: 35.4 % — ABNORMAL LOW (ref 36.0–46.0)
Hemoglobin: 11.6 g/dL — ABNORMAL LOW (ref 12.0–15.0)
MCH: 29.1 pg (ref 26.0–34.0)
MCHC: 32.8 g/dL (ref 30.0–36.0)
MCV: 88.9 fL (ref 78.0–100.0)
Platelets: 247 10*3/uL (ref 150–400)
RBC: 3.98 MIL/uL (ref 3.87–5.11)
RDW: 14.4 % (ref 11.5–15.5)
WBC: 14.6 10*3/uL — ABNORMAL HIGH (ref 4.0–10.5)

## 2015-05-30 LAB — GLUCOSE, CAPILLARY
Glucose-Capillary: 192 mg/dL — ABNORMAL HIGH (ref 65–99)
Glucose-Capillary: 128 mg/dL — ABNORMAL HIGH (ref 65–99)
Glucose-Capillary: 242 mg/dL — ABNORMAL HIGH (ref 65–99)

## 2015-05-30 LAB — BASIC METABOLIC PANEL
Anion gap: 8 (ref 5–15)
BUN: 27 mg/dL — ABNORMAL HIGH (ref 6–20)
CO2: 28 mmol/L (ref 22–32)
Calcium: 9.4 mg/dL (ref 8.9–10.3)
Chloride: 103 mmol/L (ref 101–111)
Creatinine, Ser: 1.03 mg/dL — ABNORMAL HIGH (ref 0.44–1.00)
GFR calc Af Amer: >60 mL/min (ref >60)
GFR calc non Af Amer: 55 mL/min — ABNORMAL LOW (ref >60)
Glucose, Bld: 174 mg/dL — ABNORMAL HIGH (ref 65–99)
Potassium: 3.8 mmol/L (ref 3.5–5.1)
Sodium: 139 mmol/L (ref 135–145)

## 2015-05-30 MED ORDER — ALBUTEROL SULFATE (2.5 MG/3ML) 0.083% IN NEBU
2.5000 mg | INHALATION_SOLUTION | RESPIRATORY_TRACT | Status: DC | PRN
Start: 2015-05-30 — End: 2015-05-30

## 2015-05-30 MED ORDER — INSULIN ASPART 100 UNIT/ML ~~LOC~~ SOLN: 0.0000 [IU] | Freq: Every day | SUBCUTANEOUS | Status: DC

## 2015-05-30 MED ORDER — ALBUTEROL SULFATE (2.5 MG/3ML) 0.083% IN NEBU
2.5000 mg | INHALATION_SOLUTION | Freq: Four times a day (QID) | RESPIRATORY_TRACT | Status: DC
  Administered 2015-05-30: 2.5 mg via RESPIRATORY_TRACT
  Filled 2015-05-30: qty 3

## 2015-05-30 MED ORDER — LISINOPRIL 5 MG PO TABS
2.5000 mg | ORAL_TABLET | Freq: Every day | ORAL | Status: DC
  Administered 2015-05-30: 2.5 mg via ORAL
  Filled 2015-05-30: qty 1

## 2015-05-30 MED ORDER — SODIUM CHLORIDE 0.9 % IV BOLUS (SEPSIS)
250.0000 mL | Freq: Once | INTRAVENOUS | Status: AC
  Administered 2015-05-30: 250 mL via INTRAVENOUS

## 2015-05-30 MED ORDER — LIDOCAINE 5 % EX PTCH
1.0000 | MEDICATED_PATCH | Freq: Two times a day (BID) | CUTANEOUS | Status: DC
  Administered 2015-05-30: 1 via TRANSDERMAL
  Filled 2015-05-30 (×6): qty 1

## 2015-05-30 MED ORDER — ONDANSETRON HCL 4 MG PO TABS: 4.0000 mg | ORAL_TABLET | Freq: Four times a day (QID) | ORAL | Status: DC | PRN

## 2015-05-30 MED ORDER — INSULIN ASPART 100 UNIT/ML ~~LOC~~ SOLN
0.0000 [IU] | Freq: Three times a day (TID) | SUBCUTANEOUS | Status: DC
  Administered 2015-05-30: 1 [IU] via SUBCUTANEOUS
  Administered 2015-05-30: 3 [IU] via SUBCUTANEOUS

## 2015-05-30 MED ORDER — MORPHINE SULFATE (PF) 2 MG/ML IV SOLN: 2.0000 mg | INTRAVENOUS | Status: DC | PRN

## 2015-05-30 MED ORDER — TIZANIDINE HCL 4 MG PO TABS
4.0000 mg | ORAL_TABLET | Freq: Two times a day (BID) | ORAL | Status: DC
  Administered 2015-05-30 (×2): 4 mg via ORAL
  Filled 2015-05-30 (×2): qty 1

## 2015-05-30 MED ORDER — ZOLPIDEM TARTRATE 5 MG PO TABS: 5.0000 mg | ORAL_TABLET | Freq: Every evening | ORAL | Status: DC | PRN

## 2015-05-30 MED ORDER — SUMATRIPTAN SUCCINATE 50 MG PO TABS: 100.0000 mg | ORAL_TABLET | ORAL | Status: DC

## 2015-05-30 MED ORDER — METFORMIN HCL 500 MG PO TABS
1000.0000 mg | ORAL_TABLET | Freq: Two times a day (BID) | ORAL | Status: DC
  Administered 2015-05-30: 1000 mg via ORAL
  Filled 2015-05-30: qty 2

## 2015-05-30 MED ORDER — ACETAMINOPHEN 650 MG RE SUPP: 650.0000 mg | Freq: Four times a day (QID) | RECTAL | Status: DC | PRN

## 2015-05-30 MED ORDER — ONDANSETRON HCL 4 MG/2ML IJ SOLN: 4.0000 mg | Freq: Four times a day (QID) | INTRAMUSCULAR | Status: DC | PRN

## 2015-05-30 MED ORDER — TRAMADOL HCL 50 MG PO TABS: 50.0000 mg | ORAL_TABLET | Freq: Four times a day (QID) | ORAL | Status: DC | PRN

## 2015-05-30 MED ORDER — INSULIN GLARGINE 100 UNIT/ML ~~LOC~~ SOLN
50.0000 [IU] | Freq: Every day | SUBCUTANEOUS | Status: DC
  Filled 2015-05-30: qty 0.5

## 2015-05-30 MED ORDER — OXYCODONE-ACETAMINOPHEN 5-325 MG PO TABS: 2.0000 | ORAL_TABLET | Freq: Four times a day (QID) | ORAL | Status: DC | PRN

## 2015-05-30 MED ORDER — FUROSEMIDE 20 MG PO TABS
20.0000 mg | ORAL_TABLET | Freq: Two times a day (BID) | ORAL | Status: DC
  Administered 2015-05-30: 20 mg via ORAL
  Filled 2015-05-30: qty 1

## 2015-05-30 MED ORDER — METOPROLOL TARTRATE 1 MG/ML IV SOLN
2.5000 mg | INTRAVENOUS | Status: DC | PRN
  Administered 2015-05-30: 2.5 mg via INTRAVENOUS
  Filled 2015-05-30: qty 5

## 2015-05-30 MED ORDER — OXYCODONE-ACETAMINOPHEN 5-325 MG PO TABS: 1.0000 | ORAL_TABLET | Freq: Three times a day (TID) | ORAL | Status: DC | PRN

## 2015-05-30 MED ORDER — SODIUM CHLORIDE 0.9 % IJ SOLN
3.0000 mL | Freq: Two times a day (BID) | INTRAMUSCULAR | Status: DC
  Administered 2015-05-30: 3 mL via INTRAVENOUS

## 2015-05-30 MED ORDER — ENOXAPARIN SODIUM 30 MG/0.3ML ~~LOC~~ SOLN
30.0000 mg | SUBCUTANEOUS | Status: DC
  Administered 2015-05-30: 30 mg via SUBCUTANEOUS
  Filled 2015-05-30: qty 0.3

## 2015-05-30 MED ORDER — SODIUM CHLORIDE 0.9 % IJ SOLN: 3.0000 mL | Freq: Two times a day (BID) | INTRAMUSCULAR | Status: DC

## 2015-05-30 MED ORDER — APIXABAN 5 MG PO TABS
5.0000 mg | ORAL_TABLET | Freq: Two times a day (BID) | ORAL | Status: DC
  Administered 2015-05-30 (×2): 5 mg via ORAL
  Filled 2015-05-30 (×2): qty 1

## 2015-05-30 MED ORDER — SODIUM CHLORIDE 0.9 % IV SOLN: INTRAVENOUS | Status: DC

## 2015-05-30 MED ORDER — NITROGLYCERIN 2 % TD OINT
1.0000 [in_us] | TOPICAL_OINTMENT | Freq: Four times a day (QID) | TRANSDERMAL | Status: DC
  Administered 2015-05-30: 1 [in_us] via TOPICAL
  Filled 2015-05-30 (×3): qty 1

## 2015-05-30 MED ORDER — LIDOCAINE 5 % EX PTCH
MEDICATED_PATCH | CUTANEOUS | Status: AC
  Filled 2015-05-30: qty 1

## 2015-05-30 MED ORDER — POTASSIUM CHLORIDE CRYS ER 10 MEQ PO TBCR
10.0000 meq | EXTENDED_RELEASE_TABLET | Freq: Every day | ORAL | Status: DC
  Administered 2015-05-30: 10 meq via ORAL
  Filled 2015-05-30: qty 1

## 2015-05-30 MED ORDER — HYDROCODONE-ACETAMINOPHEN 5-325 MG PO TABS
1.0000 | ORAL_TABLET | ORAL | Status: DC | PRN
  Administered 2015-05-30 (×3): 2 via ORAL
  Filled 2015-05-30 (×3): qty 2

## 2015-05-30 MED ORDER — ROPINIROLE HCL 0.25 MG PO TABS
0.2500 mg | ORAL_TABLET | Freq: Every day | ORAL | Status: DC
  Filled 2015-05-30 (×2): qty 1

## 2015-05-30 MED ORDER — PNEUMOCOCCAL VAC POLYVALENT 25 MCG/0.5ML IJ INJ
0.5000 mL | INJECTION | INTRAMUSCULAR | Status: AC
  Administered 2015-05-30: 0.5 mL via INTRAMUSCULAR
  Filled 2015-05-30: qty 0.5

## 2015-05-30 MED ORDER — SODIUM CHLORIDE 0.9 % IV SOLN: 250.0000 mL | INTRAVENOUS | Status: DC | PRN

## 2015-05-30 MED ORDER — ACETAMINOPHEN 325 MG PO TABS: 650.0000 mg | ORAL_TABLET | Freq: Four times a day (QID) | ORAL | Status: DC | PRN

## 2015-05-30 MED ORDER — VITAMIN D 1000 UNITS PO TABS: 1000.0000 [IU] | ORAL_TABLET | Freq: Every day | ORAL | Status: DC

## 2015-05-30 MED ORDER — ATORVASTATIN CALCIUM 40 MG PO TABS
40.0000 mg | ORAL_TABLET | Freq: Every day | ORAL | Status: DC
  Administered 2015-05-30: 40 mg via ORAL
  Filled 2015-05-30: qty 1

## 2015-05-30 MED ORDER — PREGABALIN 50 MG PO CAPS
50.0000 mg | ORAL_CAPSULE | Freq: Three times a day (TID) | ORAL | Status: DC
  Administered 2015-05-30: 50 mg via ORAL
  Filled 2015-05-30: qty 1

## 2015-05-30 MED ORDER — RIZATRIPTAN BENZOATE 10 MG PO TBDP: 10.0000 mg | ORAL_TABLET | Freq: Once | ORAL | Status: DC | PRN

## 2015-05-30 MED ORDER — SODIUM CHLORIDE 0.9 % IJ SOLN: 3.0000 mL | INTRAMUSCULAR | Status: DC | PRN

## 2015-05-30 MED ORDER — METOPROLOL TARTRATE 50 MG PO TABS
100.0000 mg | ORAL_TABLET | Freq: Two times a day (BID) | ORAL | Status: DC
  Administered 2015-05-30 (×2): 100 mg via ORAL
  Filled 2015-05-30 (×2): qty 2

## 2015-05-30 MED ORDER — FENOFIBRATE 54 MG PO TABS
54.0000 mg | ORAL_TABLET | Freq: Every day | ORAL | Status: DC
  Administered 2015-05-30: 54 mg via ORAL
  Filled 2015-05-30 (×3): qty 1

## 2015-05-30 MED ORDER — ARIPIPRAZOLE 5 MG PO TABS
15.0000 mg | ORAL_TABLET | Freq: Every day | ORAL | Status: DC
  Filled 2015-05-30 (×2): qty 3
  Filled 2015-05-30: qty 1

## 2015-05-30 NOTE — Consult Note (Signed)
Reason for Consult:   Chest pain, SOB  Requesting Physician: Dr Mauri [place] Primary Cardiologist Dr Percival Spanish  HPI:   69 y.o. female seen initialy by Dr Percival Spanish 02/07/15. She moved here from [place]. She is in persistent atrial fibrillation. She said this started about a year ago. She apparently presented to hospital with shortness of breath. She may have had pneumonia. She describes heart failure but doesn't know what her ejection fraction was. She was apparently in fibrillation and it sounds like she had cardioversion but didn't maintain sinus rhythm. They have obviously pursued rate control and anticoagulation. Records indicate a Myoview in Oct 2015 was low risk. An echo done in Sept 2016 showed normal LVF- EF 55-60%.          She presents now through the ED at [facility] with dyspnea and chest pain. Her chest pain is vague- "lt sided ache". Her main complaint was fairly sudden dyspnea. Troponin has been negative x 3, BNP was 629.   PMHx:  Past Medical History  Diagnosis Date  . Diabetes mellitus without complication ([place])   . Hyperlipidemia   . Hypertension   . Bipolar affective ([place])   . Atrial fibrillation ([place])   . Chronic back pain   . Pain management     Past Surgical History  Procedure Laterality Date  . Thigh surgery    . Shoulder surgery Right   . Breast reduction surgery      SOCHx:  reports that she has never smoked. She does not have any smokeless tobacco history on file. She reports that she does not drink alcohol or use illicit drugs.  FAMHx: Family History  Problem Relation Age of Onset  . Diabetes Mother   . Heart disease Mother   . Heart disease Brother   . Heart disease Sister     CABG    ALLERGIES: Allergies  Allergen Reactions  . Ivp Dye [Iodinated Diagnostic Agents] Swelling    Throat closes    ROS: Review of Systems: General: negative for chills, fever, night sweats or weight changes.  Cardiovascular: negative for palpitations,  paroxysmal nocturnal dyspnea  HEENT: negative for any visual disturbances, blindness, glaucoma Dermatological: negative for rash Respiratory: negative for cough, hemoptysis, or wheezing Urologic: negative for hematuria or dysuria Abdominal: negative for nausea, vomiting, diarrhea, bright red blood per rectum, melena, or hematemesis Neurologic: negative for visual changes, syncope, or dizziness Musculoskeletal: negative for back pain, joint pain, or swelling Psych: cooperative and appropriate All other systems reviewed and are otherwise negative except as noted above.   HOME MEDICATIONS: Prior to Admission medications   Medication Sig Start Date End Date Taking? Authorizing Provider  apixaban (ELIQUIS) 5 MG TABS tablet Take 5 mg by mouth 2 (two) times daily.   Yes Historical Provider, MD  ARIPiprazole (ABILIFY) 15 MG tablet Take 1 tablet (15 mg total) by mouth daily. 04/13/15  Yes Timmothy Euler, MD  atorvastatin (LIPITOR) 40 MG tablet Take 1 tablet (40 mg total) by mouth daily. 02/08/15  Yes Timmothy Euler, MD  Cholecalciferol (VITAMIN D3) 1000 UNITS CAPS Take 1,000 Units by mouth daily.   Yes Historical Provider, MD  fenofibrate (TRICOR) 145 MG tablet Take 145 mg by mouth daily.   Yes Historical Provider, MD  furosemide (LASIX) 20 MG tablet Take 20 mg by mouth 2 (two) times daily.   Yes Historical Provider, MD  ibuprofen (ADVIL,MOTRIN) 800 MG tablet TAKE ONE TABLET BY MOUTH 3 TIMES DAILY AS  NEEDED FOR PAIN 05/17/15  Yes Historical Provider, MD  Insulin Glargine (LANTUS SOLOSTAR) 100 UNIT/ML Solostar Pen Inject 56 Units into the skin at bedtime.    Yes Historical Provider, MD  lidocaine (LIDODERM) 5 % Place 1 patch onto the skin 2 (two) times daily. 02/08/15  Yes Timmothy Euler, MD  lisinopril (ZESTRIL) 2.5 MG tablet Take 1 tablet (2.5 mg total) by mouth daily. 04/25/15  Yes Timmothy Euler, MD  metoprolol (LOPRESSOR) 100 MG tablet Take 1 tablet (100 mg total) by mouth 2 (two) times  daily. 04/16/15  Yes Timmothy Euler, MD  Multiple Vitamin (MULTIVITAMIN WITH MINERALS) TABS tablet Take 1 tablet by mouth daily.   Yes Historical Provider, MD  oxyCODONE-acetaminophen (PERCOCET) 10-325 MG tablet TAKE ONE TABLET BY MOUTH FOUR TIMES A DAY AS NEEDED FOR 30 DAYS 05/11/15  Yes Historical Provider, MD  potassium chloride (K-DUR,KLOR-CON) 10 MEQ tablet TAKE 1 TABLET 2 TIMES DAILY WITH FOOD 11/06/14  Yes Historical Provider, MD  pregabalin (LYRICA) 50 MG capsule 1 pill morning and afternoon, 2 pills before bed Patient taking differently: Take 50-100 mg by mouth 2 (two) times daily. 1 pill morning and afternoon, 2 pills before bed 04/16/15  Yes Timmothy Euler, MD  SUMAtriptan (IMITREX) 100 MG tablet Take 1 tablet (100 mg total) by mouth See admin instructions. 03/20/15  Yes Timmothy Euler, MD  tiZANidine (ZANAFLEX) 4 MG tablet Take 1 tablet (4 mg total) by mouth 2 (two) times daily. 05/11/15  Yes Timmothy Euler, MD  traMADol (ULTRAM) 50 MG tablet Take 1 tablet (50 mg total) by mouth every 6 (six) hours as needed. for pain 05/24/15  Yes Timmothy Euler, MD  zaleplon (SONATA) 10 MG capsule Take 20 mg by mouth at bedtime.   Yes Historical Provider, MD  Blood Glucose Monitoring Suppl (FREESTYLE LITE) DEVI Use to check blood sugar tid. DX E11.22 04/04/15   Timmothy Euler, MD  glucose blood (FREESTYLE LITE) test strip Test tid. DX E11.22 04/04/15   Timmothy Euler, MD  Lancets (FREESTYLE) lancets Test tid. DX E11.22 04/04/15   Timmothy Euler, MD    HOSPITAL MEDICATIONS: I have reviewed the patient's current medications.  VITALS: Blood pressure 101/57, pulse 86, temperature 98.4 F (36.9 C), temperature source Oral, resp. rate 20, height 5\' 2"  (1.575 m), weight 205 lb (92.987 kg), SpO2 99 %.  PHYSICAL EXAM: General appearance: alert, cooperative, no distress and moderately obese Neck: no carotid bruit and no JVD Lungs: decreased breath sounds Rt base Heart: irregularly  irregular rhythm Abdomen: obese, non tender Extremities: no edema Pulses: 2+ and symmetric Skin: Skin color, texture, turgor normal. No rashes or lesions Neurologic: Grossly normal  LABS: Results for orders placed or performed during the hospital encounter of 05/29/15 (from the past 24 hour(s))  Basic metabolic panel     Status: Abnormal   Collection Time: 05/29/15  8:10 PM  Result Value Ref Range   Sodium 139 135 - 145 mmol/L   Potassium 4.3 3.5 - 5.1 mmol/L   Chloride 103 101 - 111 mmol/L   CO2 27 22 - 32 mmol/L   Glucose, Bld 175 (H) 65 - 99 mg/dL   BUN 30 (H) 6 - 20 mg/dL   Creatinine, Ser 1.10 (H) 0.44 - 1.00 mg/dL   Calcium 9.9 8.9 - 10.3 mg/dL   GFR calc non Af Amer 51 (L) >60 mL/min   GFR calc Af Amer 59 (L) >60 mL/min   Anion gap 9  5 - 15  CBC     Status: Abnormal   Collection Time: 05/29/15  8:10 PM  Result Value Ref Range   WBC 13.7 (H) 4.0 - 10.5 K/uL   RBC 4.13 3.87 - 5.11 MIL/uL   Hemoglobin 11.9 (L) 12.0 - 15.0 g/dL   HCT 37.3 36.0 - 46.0 %   MCV 90.3 78.0 - 100.0 fL   MCH 28.8 26.0 - 34.0 pg   MCHC 31.9 30.0 - 36.0 g/dL   RDW 14.2 11.5 - 15.5 %   Platelets 233 150 - 400 K/uL  Troponin I     Status: None   Collection Time: 05/29/15  8:10 PM  Result Value Ref Range   Troponin I <0.03 <0.031 ng/mL  Brain natriuretic peptide     Status: Abnormal   Collection Time: 05/29/15  8:10 PM  Result Value Ref Range   B Natriuretic Peptide 692.0 (H) 0.0 - 100.0 pg/mL  Basic metabolic panel     Status: Abnormal   Collection Time: 05/30/15  2:53 AM  Result Value Ref Range   Sodium 139 135 - 145 mmol/L   Potassium 3.8 3.5 - 5.1 mmol/L   Chloride 103 101 - 111 mmol/L   CO2 28 22 - 32 mmol/L   Glucose, Bld 174 (H) 65 - 99 mg/dL   BUN 27 (H) 6 - 20 mg/dL   Creatinine, Ser 1.03 (H) 0.44 - 1.00 mg/dL   Calcium 9.4 8.9 - 10.3 mg/dL   GFR calc non Af Amer 55 (L) >60 mL/min   GFR calc Af Amer >60 >60 mL/min   Anion gap 8 5 - 15  CBC     Status: Abnormal   Collection  Time: 05/30/15  2:53 AM  Result Value Ref Range   WBC 14.6 (H) 4.0 - 10.5 K/uL   RBC 3.98 3.87 - 5.11 MIL/uL   Hemoglobin 11.6 (L) 12.0 - 15.0 g/dL   HCT 35.4 (L) 36.0 - 46.0 %   MCV 88.9 78.0 - 100.0 fL   MCH 29.1 26.0 - 34.0 pg   MCHC 32.8 30.0 - 36.0 g/dL   RDW 14.4 11.5 - 15.5 %   Platelets 247 150 - 400 K/uL  Troponin I     Status: None   Collection Time: 05/30/15  2:53 AM  Result Value Ref Range   Troponin I <0.03 <0.031 ng/mL  Magnesium     Status: None   Collection Time: 05/30/15  2:53 AM  Result Value Ref Range   Magnesium 1.8 1.7 - 2.4 mg/dL  Glucose, capillary     Status: Abnormal   Collection Time: 05/30/15  3:36 AM  Result Value Ref Range   Glucose-Capillary 192 (H) 65 - 99 mg/dL   Comment 1 Notify RN    Comment 2 Document in Chart   Troponin I     Status: None   Collection Time: 05/30/15  7:53 AM  Result Value Ref Range   Troponin I <0.03 <0.031 ng/mL  Glucose, capillary     Status: Abnormal   Collection Time: 05/30/15  8:10 AM  Result Value Ref Range   Glucose-Capillary 128 (H) 65 - 99 mg/dL    EKG: AF with TWI 1, AVL, V2  IMAGING: Dg Chest 2 View  05/29/2015  CLINICAL DATA:  Chest pain and shortness of Breath EXAM: CHEST - 2 VIEW COMPARISON:  None. FINDINGS: Cardiac shadow is enlarged. The lungs are well aerated bilaterally. Some patchy atelectatic changes are noted in the left lingula. No  sizable effusion is seen. No bony abnormality is noted. IMPRESSION: Left basilar atelectasis. Electronically Signed   By: Inez Catalina M.D.   On: 05/29/2015 18:43    IMPRESSION: Principal Problem:   Chest pain with moderate risk of acute coronary syndrome Active Problems:   Acute on chronic diastolic CHF (congestive heart failure), NYHA class 1 (HCC)   Diabetes mellitus with stage 2 chronic kidney disease (HCC)   Permanent atrial fibrillation (HCC)   Essential hypertension   Chronic anticoagulation   Hyperlipidemia   Chronic back pain   Bipolar disorder ([place])    Family history of coronary artery disease in sister   RECOMMENDATION: Lasix 40 IV x1 given in ED last pm- 3.5L diuresis. Lasix 20 mg BID ordered today. Lexiscan Myoview tomorrow (she ate a large breakfast this am).   Time Spent Directly with Patient: 40 minutes  Kerin Ransom, [place] beeper 05/30/2015, 10:18 AM   The patient was seen and examined, and I agree with the assessment and plan as documented above, with modifications as noted below. Pt of Dr. Percival Spanish who presented primarily with shortness of breath and some vague chest discomfort. Has since ruled out for an acute coronary syndrome. Pt says breathing has improved tremendously and wants to go home. Nuclear stress test performed in [place] in 03/2014 was low risk. Echocardiogram in 02/2015 demonstrated normal LV systolic function (diastolic function could not be assessed as rhythm was atrial fibrillation), with moderate to severe left atrial enlargement. Has failed at least 2 attempts at cardioversion in the past. WBC elevated. Denies dysuria, some mild abdominal discomfort, no diarrhea, no productive cough, +chills. CXR did not show pulmonary edema. BNP elevated. Received IV Lasix in ED and has diuresed >3 liters. ECG showed atrial fibrillation, HR 100 bpm, and a T wave abnormality in high lateral leads (diagonal branch territory).  Mother, brother, and sister have heart disease. A brother had an MI earlier this year.  Given numerous CV risk factors, will arrange for outpatient stress testing and close follow up with Dr. Percival Spanish. Patient and husband in agreement with this plan.   Kate Sable, MD, [facility]  05/30/2015 10:36 AM

## 2015-05-30 NOTE — Care Management Obs Status (Signed)
[place] NOTIFICATION   Patient Details  Name: Aasiyah Schwitzer MRN: [id number] Date of Birth: 1946/03/14   Medicare Observation Status Notification Given:  Yes    Joylene Draft, RN 05/30/2015, 11:19 AM

## 2015-05-30 NOTE — Progress Notes (Signed)
Patient states that she just "don't feel right". C/o dizziness and she is diaphoretic. Vital signs revealed a BP of 88/48 and pulse of 86. Patient had just gotten 100 mg of po lopressor and 1 inch of nitro ointment applied about 1 hour prior to this episode. Contacted hospitalist. Order received to remove nitro and give a 250 cc NS bolus. Will continue to monitor closely.

## 2015-05-30 NOTE — Progress Notes (Signed)
Myoview next week at [facility] canceled. New order placed for [facility]. Pt is to fu with Dr. Leighton Roach after the [facility] per Kerin Ransom, PA-C.

## 2015-05-30 NOTE — Discharge Summary (Signed)
Physician Discharge Summary  Amber Stephenson [id number] DOB: 12-14-1945 DOA: 05/29/2015  PCP: Kenn File, MD  Admit date: 05/29/2015 Discharge date: 05/30/2015  Time spent: 45 minutes  Recommendations for Outpatient Follow-up:  -Will be discharged home today. -Has follow-up scheduled with her cardiologist Dr. Kipp Stephenson, cardiology will also arrange for an outpatient stress test.   Discharge Diagnoses:  Principal Problem:   Chest pain with moderate risk of acute coronary syndrome Active Problems:   Diabetes mellitus with stage 2 chronic kidney disease (HCC)   Hyperlipidemia   Chronic back pain   Permanent atrial fibrillation (HCC)   Bipolar disorder ([place])   Essential hypertension   Chronic anticoagulation   Family history of coronary artery disease in sister   Acute on chronic diastolic CHF (congestive heart failure), NYHA class 1 ([place])   Discharge Condition: Stable and improved  Filed Weights   05/29/15 1729 05/30/15 0152  Weight: 92.987 kg (205 lb) 92.987 kg (205 lb)    History of present illness:  As per Dr. Laren Stephenson on 12/27: Amber Stephenson is a 69 y.o. female, with past medical history significant for diabetes mellitus hypertension and atrial fibrillation presenting today with chest pain that started at 4 PM 8 is retrosternal nonradiating 4/10 originally and now down to 3/10. The chest pain is not pleuritic and no associated nausea or cold sweats. Patient reports shortness of breath though No exacerbating or relieving factors noted. Her heart rate is 100 bpm and her BNP was elevated, so she received Lasix in the emergency room. The chest pain was continuous, described as heaviness, and not associated with dizziness or loss of consciousness . Patient had an echocardiogram done on 9 /2016 which showed a normal ejection fraction with no significant diastolic dysfunction. The patient recently moved from [facility] Course:   Chest pain -Has ruled out  for acute coronary syndrome with negative troponins . -EKG does have some lateral T-wave abnormalities. -Given her numerous cardiovascular risk factors including family history, I consulted cardiology for consideration of inpatient stress test. Given patient's insistence upon going home today, outpatient stress test has been scheduled and follow-up with cardiology has been arranged.  Rest of chronic medical conditions have been stable and home medications have not been changed.  Procedures:  None   Consultations:  Cardiology  Discharge Instructions  Discharge Instructions    Diet - low sodium heart healthy    Complete by:  As directed      Increase activity slowly    Complete by:  As directed             Medication List    TAKE these medications        ARIPiprazole 15 MG tablet  Commonly known as:  ABILIFY  Take 1 tablet (15 mg total) by mouth daily.     atorvastatin 40 MG tablet  Commonly known as:  LIPITOR  Take 1 tablet (40 mg total) by mouth daily.     ELIQUIS 5 MG Tabs tablet  Generic drug:  apixaban  Take 5 mg by mouth 2 (two) times daily.     fenofibrate 145 MG tablet  Commonly known as:  TRICOR  Take 145 mg by mouth daily.     freestyle lancets  Test tid. DX E11.22     FREESTYLE LITE Devi  Use to check blood sugar tid. DX E11.22     furosemide 20 MG tablet  Commonly known as:  LASIX  Take 20 mg by mouth 2 (two)  times daily.     glucose blood test strip  Commonly known as:  FREESTYLE LITE  Test tid. DX E11.22     ibuprofen 800 MG tablet  Commonly known as:  ADVIL,MOTRIN  TAKE ONE TABLET BY MOUTH 3 TIMES DAILY AS NEEDED FOR PAIN     LANTUS SOLOSTAR 100 UNIT/ML Solostar Pen  Generic drug:  Insulin Glargine  Inject 56 Units into the skin at bedtime.     lidocaine 5 %  Commonly known as:  LIDODERM  Place 1 patch onto the skin 2 (two) times daily.     lisinopril 2.5 MG tablet  Commonly known as:  ZESTRIL  Take 1 tablet (2.5 mg total) by mouth  daily.     metoprolol 100 MG tablet  Commonly known as:  LOPRESSOR  Take 1 tablet (100 mg total) by mouth 2 (two) times daily.     multivitamin with minerals Tabs tablet  Take 1 tablet by mouth daily.     oxyCODONE-acetaminophen 10-325 MG tablet  Commonly known as:  PERCOCET  TAKE ONE TABLET BY MOUTH FOUR TIMES A DAY AS NEEDED FOR 30 DAYS     potassium chloride 10 MEQ tablet  Commonly known as:  K-DUR,KLOR-CON  TAKE 1 TABLET 2 TIMES DAILY WITH FOOD     pregabalin 50 MG capsule  Commonly known as:  LYRICA  1 pill morning and afternoon, 2 pills before bed     SUMAtriptan 100 MG tablet  Commonly known as:  IMITREX  Take 1 tablet (100 mg total) by mouth See admin instructions.     tiZANidine 4 MG tablet  Commonly known as:  ZANAFLEX  Take 1 tablet (4 mg total) by mouth 2 (two) times daily.     traMADol 50 MG tablet  Commonly known as:  ULTRAM  Take 1 tablet (50 mg total) by mouth every 6 (six) hours as needed. for pain     Vitamin D3 1000 units Caps  Take 1,000 Units by mouth daily.     zaleplon 10 MG capsule  Commonly known as:  SONATA  Take 20 mg by mouth at bedtime.       Allergies  Allergen Reactions  . Ivp Dye [Iodinated Diagnostic Agents] Swelling    Throat closes       Follow-up Information    Follow up with Minus Breeding, MD On 07/03/2015.   Specialty:  Cardiology   Why:  3:45 pm   Contact information:   [address] [place] [address] [phone]       Follow up with [facility] On 06/08/2015.   Why:  Register at [facility] at 8:00 am for Stress test. Do not eat or drink after midnight.   Contact information:   [phone]       The results of significant diagnostics from this hospitalization (including imaging, microbiology, ancillary and laboratory) are listed below for reference.    Significant Diagnostic Studies: Dg Chest 2 View  05/29/2015  CLINICAL DATA:  Chest pain and shortness of Breath EXAM: CHEST - 2 VIEW  COMPARISON:  None. FINDINGS: Cardiac shadow is enlarged. The lungs are well aerated bilaterally. Some patchy atelectatic changes are noted in the left lingula. No sizable effusion is seen. No bony abnormality is noted. IMPRESSION: Left basilar atelectasis. Electronically Signed   By: Inez Catalina M.D.   On: 05/29/2015 18:43    Microbiology: No results found for this or any previous visit (from the past 240 hour(s)).  Labs: Basic Metabolic Panel:  Recent Labs Lab 05/29/15 2010 05/30/15 0253  NA 139 139  K 4.3 3.8  CL 103 103  CO2 27 28  GLUCOSE 175* 174*  BUN 30* 27*  CREATININE 1.10* 1.03*  CALCIUM 9.9 9.4  MG  --  1.8   Liver Function Tests: No results for input(s): AST, ALT, ALKPHOS, BILITOT, PROT, ALBUMIN in the last 168 hours. No results for input(s): LIPASE, AMYLASE in the last 168 hours. No results for input(s): AMMONIA in the last 168 hours. CBC:  Recent Labs Lab 05/29/15 2010 05/30/15 0253  WBC 13.7* 14.6*  HGB 11.9* 11.6*  HCT 37.3 35.4*  MCV 90.3 88.9  PLT 233 247   Cardiac Enzymes:  Recent Labs Lab 05/29/15 2010 05/30/15 0253 05/30/15 0753  TROPONINI <0.03 <0.03 <0.03   BNP: BNP (last 3 results)  Recent Labs  05/29/15 2010  BNP 692.0*    ProBNP (last 3 results) No results for input(s): PROBNP in the last 8760 hours.  CBG:  Recent Labs Lab 05/30/15 0336 05/30/15 0810 05/30/15 1206  GLUCAP 192* 128* 242*       Signed:  Lelon Frohlich  [facility] Pager: [phone] 05/30/2015, 2:45 PM

## 2015-05-30 NOTE — Progress Notes (Signed)
Patient with orders to be discharge home. Discharge instructions given, patient verbalized understanding. Patient stable. Patient left in private vehicle with husband.

## 2015-05-30 NOTE — Discharge Instructions (Signed)

## 2015-05-30 NOTE — Care Management Note (Signed)
Case Management Note  Patient Details  Name: Amber Stephenson MRN: [id number] Date of Birth: [birth year]  Subjective/Objective:                  Pt admitted from home with CP. Pt lives with her husband and will return home at discharge. Pt is independent with ADL's.  Action/Plan: No CM needs noted.  Expected Discharge Date:                  Expected Discharge Plan:  Home/Self Care  In-House Referral:  NA  Discharge planning Services  CM Consult  Post Acute Care Choice:  NA Choice offered to:  NA  DME Arranged:    DME Agency:     HH Arranged:    HH Agency:     Status of Service:  Completed, signed off  Medicare Important Message Given:    Date Medicare IM Given:    Medicare IM give by:    Date Additional Medicare IM Given:    Additional Medicare Important Message give by:     If discussed at [place] of Stay Meetings, dates discussed:    Additional Comments:  Joylene Draft, RN 05/30/2015, 11:24 AM

## 2015-05-31 ENCOUNTER — Ambulatory Visit: Payer: Medicare Other | Admitting: Nutrition

## 2015-05-31 ENCOUNTER — Ambulatory Visit: Payer: Medicare Other | Admitting: "Endocrinology

## 2015-05-31 LAB — HEMOGLOBIN A1C
Hgb A1c MFr Bld: 9 % — ABNORMAL HIGH (ref 4.8–5.6)
Mean Plasma Glucose: 212 mg/dL

## 2015-06-01 ENCOUNTER — Telehealth: Payer: Self-pay | Admitting: *Deleted

## 2015-06-01 NOTE — Telephone Encounter (Signed)
Call Completed and Appointment Scheduled: Yes, Date: 06/10/14 Dr Marline Backbone INFORMATION Date of Discharge:05/30/15  Discharge Facility: [facility]   Principal Discharge Diagnosis: Chest Pain  Patient and/or caregiver is knowledgeable of his/her condition(s) and treatment: Yes   MEDICATION RECONCILIATION Medication list reviewed with patient: Yes  Patient is able to obtain needed medications: Yes   ACTIVITIES OF DAILY LIVING  Is the patient able to perform his/her own ADLs: Yes  Patient is receiving home health services: no   PATIENT EDUCATION Questions/Concerns Discussed: No questions or concerns at this time. Patient states that she is feeling better. She will contact us if anything changes.

## 2015-06-05 ENCOUNTER — Other Ambulatory Visit: Payer: Self-pay | Admitting: Family Medicine

## 2015-06-05 MED ORDER — SUMATRIPTAN SUCCINATE 100 MG PO TABS: 100.0000 mg | ORAL_TABLET | ORAL | Status: DC

## 2015-06-05 NOTE — Telephone Encounter (Signed)
Rx refilled  Laroy Apple, MD [facility] 06/05/2015, 9:26 AM

## 2015-06-05 NOTE — Telephone Encounter (Signed)
Aware, script sent to pharmacy. 

## 2015-06-08 ENCOUNTER — Encounter (HOSPITAL_COMMUNITY): Payer: Medicare Other

## 2015-06-11 ENCOUNTER — Ambulatory Visit: Payer: Medicare Other | Admitting: Family Medicine

## 2015-06-15 ENCOUNTER — Telehealth: Payer: Self-pay | Admitting: Family Medicine

## 2015-06-19 ENCOUNTER — Encounter: Payer: Self-pay | Admitting: Family Medicine

## 2015-06-19 ENCOUNTER — Ambulatory Visit (INDEPENDENT_AMBULATORY_CARE_PROVIDER_SITE_OTHER): Payer: Medicare Other | Admitting: Family Medicine

## 2015-06-19 VITALS — BP 140/86 | HR 77 | Temp 97.0°F | Ht 62.0 in | Wt 199.6 lb

## 2015-06-19 DIAGNOSIS — I10 Essential (primary) hypertension: Secondary | ICD-10-CM

## 2015-06-19 DIAGNOSIS — I5033 Acute on chronic diastolic (congestive) heart failure: Secondary | ICD-10-CM

## 2015-06-19 DIAGNOSIS — G8929 Other chronic pain: Secondary | ICD-10-CM | POA: Diagnosis not present

## 2015-06-19 DIAGNOSIS — M549 Dorsalgia, unspecified: Secondary | ICD-10-CM | POA: Diagnosis not present

## 2015-06-19 MED ORDER — LISINOPRIL 10 MG PO TABS: 10.0000 mg | ORAL_TABLET | Freq: Every day | ORAL | Status: DC

## 2015-06-19 NOTE — Patient Instructions (Addendum)
Great to see you!  Lets see you back in 1 month  I have sent a new rx for lisinopril, this is increasing it to 10 mg I owuld like to look at your kidney labs again in 1 month  You should be called by physical therapy to schedule an appointment

## 2015-06-19 NOTE — Progress Notes (Signed)
   HPI  Patient presents today here today to discuss recent hospitalization, back pain, hypertension  She was hospitalized at [facility] on December 27 for chest pain to rule out ACS. Her troponins were negative and she was scheduled for a Lexi scan which is tomorrow. She has had no more chest pain since leaving the hospital, her dyspnea has improved.  She states that she would mistakenly stopped Lasix for for 5 days prior to going into the hospital. Her dyspnea has improved since restarting She still has slight orthopnea  Hypertension Good medication compliance States that her blood pressure is routinely above 140 to low 150s at home. Considering increasing her medication dose. No continued chest pain, recent shortness of breath improved although still slight orthopnea, no palpitations  Back pain She has a history of chronic back pain management narcotics and tramadol. She requests physical therapy referral  PMH: Smoking status noted ROS: Per HPI  Objective: BP 140/86 mmHg  Pulse 77  Temp(Src) 97 F (36.1 C) (Oral)  Ht 5\' 2"  (1.575 m)  Wt 199 lb 9.6 oz (90.538 kg)  BMI 36.50 kg/m2 Gen: NAD, alert, cooperative with exam HEENT: NCAT CV: RRR, good S1/S2, no murmur Resp: Nonlabored, left lower lung field with some crackles at the base, otherwise clear Ext: No edema, warm Neuro: Alert and oriented, No gross deficits  Assessment and plan:  # Chest pain Ruled out for ACS during the hospitalization, she has a stress Myoview scheduled for tomorrow  # Diastolic CHF, acutely worsening during recent hospitalization Continue Lasix, discussed dose adjustment to 8 AM and 3 PM Increase lisinopril with blood pressure consistently above 140 Slight crackles on exam but nonlabored, continue current diuresis  # Hypertension Slightly elevated Continue metoprolol Continue Lasix Increase lisinopril to 10 mg, repeat labs next month  # Chronic back pain Refer to physical  therapy Managed with narcotics from her pain management clinic    Orders Placed This Encounter  Procedures  . Ambulatory referral to Physical Therapy    Referral Priority:  Routine    Referral Type:  Physical Medicine    Referral Reason:  Specialty Services Required    Requested Specialty:  Physical Therapy    Number of Visits Requested:  1    Meds ordered this encounter  Medications  . lisinopril (PRINIVIL,ZESTRIL) 10 MG tablet    Sig: Take 1 tablet (10 mg total) by mouth daily.    Dispense:  30 tablet    Refill:  [place], MD [place] 06/19/2015, 5:16 PM

## 2015-06-20 ENCOUNTER — Encounter (HOSPITAL_COMMUNITY)
Admission: RE | Admit: 2015-06-20 | Discharge: 2015-06-20 | Disposition: A | Discharge status: Home / Self Care | Payer: Medicare Other | Source: Ambulatory Visit | Attending: Cardiology | Admitting: Cardiology

## 2015-06-20 ENCOUNTER — Encounter (HOSPITAL_COMMUNITY): Payer: Self-pay

## 2015-06-20 ENCOUNTER — Telehealth: Payer: Self-pay | Admitting: Family Medicine

## 2015-06-20 ENCOUNTER — Inpatient Hospital Stay (HOSPITAL_COMMUNITY): Admission: RE | Admit: 2015-06-20 | Payer: Medicare Other | Source: Ambulatory Visit

## 2015-06-20 DIAGNOSIS — R079 Chest pain, unspecified: Secondary | ICD-10-CM | POA: Diagnosis not present

## 2015-06-20 DIAGNOSIS — R072 Precordial pain: Secondary | ICD-10-CM

## 2015-06-20 HISTORY — DX: Heart failure, unspecified: I50.9

## 2015-06-20 HISTORY — DX: Disorder of kidney and ureter, unspecified: N28.9

## 2015-06-20 LAB — NM MYOCAR MULTI W/SPECT W/WALL MOTION / EF
LV dias vol: 57 mL
LV sys vol: 26 mL
Peak HR: 116 {beats}/min
RATE: 0.48
Rest HR: 83 {beats}/min
SDS: 4
SRS: 1
SSS: 5
TID: 1.01

## 2015-06-20 MED ORDER — REGADENOSON 0.4 MG/5ML IV SOLN
INTRAVENOUS | Status: AC
Start: 2015-06-20 — End: 2015-06-20
  Administered 2015-06-20: 0.4 mg via INTRAVENOUS
  Filled 2015-06-20: qty 5

## 2015-06-20 MED ORDER — TECHNETIUM TC 99M SESTAMIBI GENERIC - CARDIOLITE
30.0000 | Freq: Once | INTRAVENOUS | Status: AC | PRN
  Administered 2015-06-20: 32.5 via INTRAVENOUS

## 2015-06-20 MED ORDER — SODIUM CHLORIDE 0.9 % IJ SOLN
INTRAMUSCULAR | Status: AC
  Administered 2015-06-20: 10 mL via INTRAVENOUS
  Filled 2015-06-20: qty 3

## 2015-06-20 MED ORDER — TECHNETIUM TC 99M SESTAMIBI - CARDIOLITE
10.0000 | Freq: Once | INTRAVENOUS | Status: AC | PRN
  Administered 2015-06-20: 10.9 via INTRAVENOUS

## 2015-06-21 ENCOUNTER — Ambulatory Visit (INDEPENDENT_AMBULATORY_CARE_PROVIDER_SITE_OTHER): Payer: Medicare Other | Admitting: Family

## 2015-06-21 ENCOUNTER — Encounter: Payer: Self-pay | Admitting: Family

## 2015-06-21 VITALS — BP 129/76 | HR 65 | Temp 97.2°F | Ht 62.0 in | Wt 199.4 lb

## 2015-06-21 DIAGNOSIS — S0502XA Injury of conjunctiva and corneal abrasion without foreign body, left eye, initial encounter: Secondary | ICD-10-CM | POA: Diagnosis not present

## 2015-06-21 MED ORDER — DICLOFENAC SODIUM 0.1 % OP SOLN: 1.0000 [drp] | Freq: Four times a day (QID) | OPHTHALMIC | Status: DC

## 2015-06-21 MED ORDER — CIPROFLOXACIN HCL 0.3 % OP SOLN: OPHTHALMIC | Status: DC

## 2015-06-21 NOTE — Progress Notes (Signed)
   Subjective:    Patient ID: Amber Stephenson, female    DOB: [birth year], 70 y.o.   MRN: [id number]  HPI PT presents to the office today with left eye pain that started this morning. Pt states she woke up this morning and was having constant eye pain of 8 out 10. Pt states she rinsed her eye several times with an "eye cup" and used saline drops with no relief. PT denies any trauma to the eye that she know of. Pt states she went to bed with no pain, and woke up with her eye hurting.    Review of Systems  Constitutional: Negative.   HENT: Negative.   Eyes: Negative.   Respiratory: Negative.  Negative for shortness of breath.   Cardiovascular: Negative.  Negative for palpitations.  Gastrointestinal: Negative.   Endocrine: Negative.   Genitourinary: Negative.   Musculoskeletal: Negative.   Neurological: Negative.  Negative for headaches.  Hematological: Negative.   Psychiatric/Behavioral: Negative.   All other systems reviewed and are negative.      Objective:   Physical Exam  Constitutional: She is oriented to person, place, and time. She appears well-developed and well-nourished. No distress.  HENT:  Head: Normocephalic and atraumatic.  Right Ear: External ear normal.  Mouth/Throat: Oropharynx is clear and moist.  Eyes: Pupils are equal, round, and reactive to light. Right eye exhibits no exudate. No foreign body present in the right eye. Left eye exhibits no exudate. No foreign body present in the left eye. Right conjunctiva has no hemorrhage. Left conjunctiva has no hemorrhage.  Neck: Normal range of motion. Neck supple. No thyromegaly present.  Cardiovascular: Normal rate, regular rhythm, normal heart sounds and intact distal pulses.   No murmur heard. Pulmonary/Chest: Effort normal and breath sounds normal. No respiratory distress. She has no wheezes.  Abdominal: Soft. Bowel sounds are normal. She exhibits no distension. There is no tenderness.  Musculoskeletal: Normal range of  motion. She exhibits no edema or tenderness.  Neurological: She is alert and oriented to person, place, and time. She has normal reflexes. No cranial nerve deficit.  Skin: Skin is warm and dry.  Psychiatric: She has a normal mood and affect. Her behavior is normal. Judgment and thought content normal.  Vitals reviewed.   BP 129/76 mmHg  Pulse 65  Temp(Src) 97.2 F (36.2 C) (Oral)  Ht 5\' 2"  (1.575 m)  Wt 199 lb 6.4 oz (90.447 kg)  BMI 36.46 kg/m2  SpO2 98%  Left eye flushed with saline- No forigen body noted.      Assessment & Plan:  1. Cornea abrasion, left, initial encounter -Avoid rubbing or touching eye  -Avoid anything in eye Tylenol or Motrin prn for pain -Avoid bring lights and driving while eye is sensitive -RTO prn  - diclofenac (VOLTAREN) 0.1 % ophthalmic solution; Place 1 drop into the left eye 4 (four) times daily.  Dispense: 5 mL; Refill: 0 - ciprofloxacin (CILOXAN) 0.3 % ophthalmic solution; Administer 1 drop, every 2 hours, while awake, for 2 days. Then 1 drop, every 4 hours, while awake, for the next 5 days.  Dispense: 10 mL; Refill: 0  Evelina Dun, FNP

## 2015-06-21 NOTE — Patient Instructions (Signed)
Corneal Abrasion °The cornea is the clear covering at the front and center of the eye. When looking at the colored portion of the eye (iris), you are looking through the cornea. This very thin tissue is made up of many layers. The surface layer is a single layer of cells (corneal epithelium) and is one of the most sensitive tissues in the body. If a scratch or injury causes the corneal epithelium to come off, it is called a corneal abrasion. If the injury extends to the tissues below the epithelium, the condition is called a corneal ulcer. °CAUSES  °· Scratches. °· Trauma. °· Foreign body in the eye. °Some people have recurrences of abrasions in the area of the original injury even after it has healed (recurrent erosion syndrome). Recurrent erosion syndrome generally improves and goes away with time. °SYMPTOMS  °· Eye pain. °· Difficulty or inability to keep the injured eye open. °· The eye becomes very sensitive to light. °· Recurrent erosions tend to happen suddenly, first thing in the morning, usually after waking up and opening the eye. °DIAGNOSIS  °Your health care provider can diagnose a corneal abrasion during an eye exam. Dye is usually placed in the eye using a drop or a small paper strip moistened by your tears. When the eye is examined with a special light, the abrasion shows up clearly because of the dye. °TREATMENT  °· Small abrasions may be treated with antibiotic drops or ointment alone. °· A pressure patch may be put over the eye. If this is done, follow your doctor's instructions for when to remove the patch. Do not drive or use machines while the eye patch is on. Judging distances is hard to do with a patch on. °If the abrasion becomes infected and spreads to the deeper tissues of the cornea, a corneal ulcer can result. This is serious because it can cause corneal scarring. Corneal scars interfere with light passing through the cornea and cause a loss of vision in the involved eye. °HOME CARE  INSTRUCTIONS °· Use medicine or ointment as directed. Only take over-the-counter or prescription medicines for pain, discomfort, or fever as directed by your health care provider. °· Do not drive or operate machinery if your eye is patched. Your ability to judge distances is impaired. °· If your health care provider has given you a follow-up appointment, it is very important to keep that appointment. Not keeping the appointment could result in a severe eye infection or permanent loss of vision. If there is any problem keeping the appointment, let your health care provider know. °SEEK MEDICAL CARE IF:  °· You have pain, light sensitivity, and a scratchy feeling in one eye or both eyes. °· Your pressure patch keeps loosening up, and you can blink your eye under the patch after treatment. °· Any kind of discharge develops from the eye after treatment or if the lids stick together in the morning. °· You have the same symptoms in the morning as you did with the original abrasion days, weeks, or months after the abrasion healed. °  °This information is not intended to replace advice given to you by your health care provider. Make sure you discuss any questions you have with your health care provider. °  °Document Released: 05/16/2000 Document Revised: 02/07/2015 Document Reviewed: 01/24/2013 °Elsevier Interactive Patient Education ©2016 [employer] ° °

## 2015-06-25 ENCOUNTER — Telehealth: Payer: Self-pay | Admitting: Family Medicine

## 2015-06-25 MED ORDER — LIDOCAINE VISCOUS 2 % MT SOLN: 20.0000 mL | OROMUCOSAL | Status: DC | PRN

## 2015-06-25 NOTE — Telephone Encounter (Signed)
Seen with her husband today in [place]. She has intermittent mouth sores previously treated with viscous lidocaine. They are unexplained but come up and resolve regularly.  No discrete persistent lesions to be concerned for malignancy.   Refilled viscous lido.   Laroy Apple, MD [facility] 06/25/2015, 4:02 PM

## 2015-06-28 ENCOUNTER — Ambulatory Visit: Payer: Medicare Other | Admitting: Cardiovascular Disease

## 2015-06-28 ENCOUNTER — Encounter: Payer: Self-pay | Admitting: Cardiovascular Disease

## 2015-06-28 ENCOUNTER — Ambulatory Visit: Payer: Medicare Other | Admitting: Family Medicine

## 2015-06-29 ENCOUNTER — Encounter: Payer: Self-pay | Admitting: Family Medicine

## 2015-06-29 ENCOUNTER — Ambulatory Visit (INDEPENDENT_AMBULATORY_CARE_PROVIDER_SITE_OTHER): Payer: Medicare Other | Admitting: Family Medicine

## 2015-06-29 VITALS — BP 140/82 | HR 72 | Temp 97.1°F | Ht 62.0 in | Wt 195.2 lb

## 2015-06-29 DIAGNOSIS — R350 Frequency of micturition: Secondary | ICD-10-CM | POA: Diagnosis not present

## 2015-06-29 DIAGNOSIS — R3915 Urgency of urination: Secondary | ICD-10-CM

## 2015-06-29 LAB — POCT URINALYSIS DIPSTICK
Bilirubin, UA: NEGATIVE
Blood, UA: NEGATIVE
Glucose, UA: 1000
Ketones, UA: NEGATIVE
Leukocytes, UA: NEGATIVE
Nitrite, UA: NEGATIVE
Protein, UA: NEGATIVE
Spec Grav, UA: 1.01
Urobilinogen, UA: NEGATIVE
pH, UA: 6.5

## 2015-06-29 LAB — POCT UA - MICROSCOPIC ONLY
Bacteria, U Microscopic: NEGATIVE
Casts, Ur, LPF, POC: NEGATIVE
Crystals, Ur, HPF, POC: NEGATIVE
Mucus, UA: NEGATIVE
RBC, urine, microscopic: NEGATIVE
WBC, Ur, HPF, POC: NEGATIVE
Yeast, UA: NEGATIVE

## 2015-06-29 MED ORDER — ZALEPLON 10 MG PO CAPS
20.0000 mg | ORAL_CAPSULE | Freq: Every day | ORAL | Status: DC
Start: 2015-06-29 — End: 2015-07-16

## 2015-06-29 NOTE — Patient Instructions (Addendum)
Great to see you!  Your urine test did not show any infection.

## 2015-06-29 NOTE — Progress Notes (Signed)
   HPI  Patient presents today here for urinary frequency and urgency.  Patient also explains that she has stable exertional dyspnea over the last month. She was hospitalized last month for this and ruled out for ACS. She has a appointment with her cardiologist on Monday.  Urinary frequency and urgency 1 week. No dysuria, fever, chills, sweats She denies recurrent UTIs. Is tolerating food and fluids easily.  PMH: Smoking status noted ROS: Per HPI  Objective: BP 140/82 mmHg  Pulse 72  Temp(Src) 97.1 F (36.2 C) (Oral)  Ht 5\' 2"  (1.575 m)  Wt 195 lb 3.2 oz (88.542 kg)  BMI 35.69 kg/m2 Gen: NAD, alert, cooperative with exam HEENT: NCAT CV: RRR, good S1/S2, no murmur Resp: CTABL, no wheezes, non-labored Abd: Soft, slight tenderness to palpation over the suprapubic area Ext: No edema, warm Neuro: Alert and oriented  Assessment and plan:  # Urinary frequency UA clear Possibly hyperglycemia, no signs of OAB  # Exertional dyspnea Unclear etiology, she's had a thorough cardiac evaluation Following up with cardiology on Monday Possibly she has underlying lung disease, however I doubt this given her clear lung exam and previous absence of symptoms.     Orders Placed This Encounter  Procedures  . POCT urinalysis dipstick  . POCT UA - Microscopic Only    Laroy Apple, MD [facility] 06/29/2015, 4:07 PM

## 2015-07-02 ENCOUNTER — Encounter: Payer: Self-pay | Admitting: Cardiovascular Disease

## 2015-07-02 ENCOUNTER — Ambulatory Visit (INDEPENDENT_AMBULATORY_CARE_PROVIDER_SITE_OTHER): Payer: Medicare Other | Admitting: Cardiovascular Disease

## 2015-07-02 VITALS — BP 148/92 | HR 85 | Ht 62.0 in | Wt 196.0 lb

## 2015-07-02 DIAGNOSIS — Z79899 Other long term (current) drug therapy: Secondary | ICD-10-CM | POA: Diagnosis not present

## 2015-07-02 DIAGNOSIS — I10 Essential (primary) hypertension: Secondary | ICD-10-CM | POA: Diagnosis not present

## 2015-07-02 DIAGNOSIS — Z79891 Long term (current) use of opiate analgesic: Secondary | ICD-10-CM | POA: Diagnosis not present

## 2015-07-02 DIAGNOSIS — I4891 Unspecified atrial fibrillation: Secondary | ICD-10-CM

## 2015-07-02 DIAGNOSIS — I5032 Chronic diastolic (congestive) heart failure: Secondary | ICD-10-CM

## 2015-07-02 DIAGNOSIS — E785 Hyperlipidemia, unspecified: Secondary | ICD-10-CM

## 2015-07-02 DIAGNOSIS — R0602 Shortness of breath: Secondary | ICD-10-CM

## 2015-07-02 DIAGNOSIS — M4696 Unspecified inflammatory spondylopathy, lumbar region: Secondary | ICD-10-CM | POA: Diagnosis not present

## 2015-07-02 DIAGNOSIS — G894 Chronic pain syndrome: Secondary | ICD-10-CM | POA: Diagnosis not present

## 2015-07-02 DIAGNOSIS — M47816 Spondylosis without myelopathy or radiculopathy, lumbar region: Secondary | ICD-10-CM | POA: Diagnosis not present

## 2015-07-02 MED ORDER — METOPROLOL TARTRATE 25 MG PO TABS: 25.0000 mg | ORAL_TABLET | Freq: Two times a day (BID) | ORAL | Status: DC

## 2015-07-02 NOTE — Addendum Note (Signed)
Addended by: Laurine Blazer on: 07/02/2015 01:24 PM   Modules accepted: Orders

## 2015-07-02 NOTE — Progress Notes (Addendum)
Patient ID: Amber Stephenson, female   DOB: February 01, 1946, 70 y.o.   MRN: [id number]      SUBJECTIVE: The patient presents to establish outpatient care with me. I saw her in consultation in December at [facility]. At that time she had presented with shortness of breath and vague chest discomfort and ruled out for an acute coronary syndrome. She was diuresed. She underwent nuclear stress testing in [place] in October 2015 which was low risk.   While hospitalized her ECG showed atrial fibrillation, heart rate 100 bpm, and a T-wave abnormality in the high lateral leads (diagonal branch territory). She has a strong family history of premature coronary artery disease.   I arranged for outpatient nuclear stress testing which was performed on 06/20/15 and was found to be normal.  She has been short of breath with minimal activity such as raising her arms above her head and bending down to tie her shoes, as well as walking across a room.  Admits to anxiety and bipolar disorder for which she sees psychiatry.   Review of Systems: As per "subjective", otherwise negative.  Allergies  Allergen Reactions  . Ivp Dye [Iodinated Diagnostic Agents] Swelling    Throat closes    Current Outpatient Prescriptions  Medication Sig Dispense Refill  . apixaban (ELIQUIS) 5 MG TABS tablet Take 5 mg by mouth 2 (two) times daily.    . ARIPiprazole (ABILIFY) 15 MG tablet Take 1 tablet (15 mg total) by mouth daily. 90 tablet 0  . atorvastatin (LIPITOR) 40 MG tablet Take 1 tablet (40 mg total) by mouth daily. 90 tablet 3  . Blood Glucose Monitoring Suppl (FREESTYLE LITE) DEVI Use to check blood sugar tid. DX E11.22 1 each 0  . Cholecalciferol (VITAMIN D3) 1000 UNITS CAPS Take 1,000 Units by mouth daily.    . ciprofloxacin (CILOXAN) 0.3 % ophthalmic solution Administer 1 drop, every 2 hours, while awake, for 2 days. Then 1 drop, every 4 hours, while awake, for the next 5 days. 10 mL 0  . diclofenac (VOLTAREN) 0.1  % ophthalmic solution Place 1 drop into the left eye 4 (four) times daily. 5 mL 0  . fenofibrate (TRICOR) 145 MG tablet Take 145 mg by mouth daily.    . furosemide (LASIX) 20 MG tablet Take 20 mg by mouth 2 (two) times daily.    Marland Kitchen glucose blood (FREESTYLE LITE) test strip Test tid. DX E11.22 100 each 5  . ibuprofen (ADVIL,MOTRIN) 800 MG tablet TAKE ONE TABLET BY MOUTH 3 TIMES DAILY AS NEEDED FOR PAIN  0  . Insulin Glargine (LANTUS SOLOSTAR) 100 UNIT/ML Solostar Pen Inject 56 Units into the skin at bedtime.     . Lancets (FREESTYLE) lancets Test tid. DX E11.22 100 each 5  . lidocaine (LIDODERM) 5 % Place 1 patch onto the skin 2 (two) times daily. 30 patch 5  . lidocaine (XYLOCAINE) 2 % solution Use as directed 20 mLs in the mouth or throat as needed for mouth pain. 100 mL 0  . lisinopril (PRINIVIL,ZESTRIL) 10 MG tablet Take 1 tablet (10 mg total) by mouth daily. 30 tablet 3  . metoprolol (LOPRESSOR) 100 MG tablet Take 1 tablet (100 mg total) by mouth 2 (two) times daily. 60 tablet 5  . Multiple Vitamin (MULTIVITAMIN WITH MINERALS) TABS tablet Take 1 tablet by mouth daily.    Marland Kitchen oxyCODONE-acetaminophen (PERCOCET) 10-325 MG tablet TAKE ONE TABLET BY MOUTH FOUR TIMES A DAY AS NEEDED FOR 30 DAYS  0  . potassium  chloride (K-DUR,KLOR-CON) 10 MEQ tablet TAKE 1 TABLET 2 TIMES DAILY WITH FOOD  0  . pregabalin (LYRICA) 50 MG capsule 1 pill morning and afternoon, 2 pills before bed (Patient taking differently: Take 50-100 mg by mouth 2 (two) times daily. 1 pill morning and afternoon, 2 pills before bed) 360 capsule 1  . SUMAtriptan (IMITREX) 100 MG tablet Take 1 tablet (100 mg total) by mouth See admin instructions. 9 tablet 2  . tiZANidine (ZANAFLEX) 4 MG tablet Take 1 tablet (4 mg total) by mouth 2 (two) times daily. 60 tablet 2  . traMADol (ULTRAM) 50 MG tablet Take 1 tablet (50 mg total) by mouth every 6 (six) hours as needed. for pain 60 tablet 2  . zaleplon (SONATA) 10 MG capsule Take 2 capsules (20 mg  total) by mouth at bedtime. 180 capsule 1   No current facility-administered medications for this visit.    Past Medical History  Diagnosis Date  . Diabetes mellitus without complication ([place])   . Hyperlipidemia   . Hypertension   . Bipolar affective ([name])   . Atrial fibrillation ([place])   . Chronic back pain   . Pain management   . CHF (congestive heart failure) ([place])   . Renal insufficiency     Past Surgical History  Procedure Laterality Date  . Thigh surgery    . Shoulder surgery Right   . Breast reduction surgery      Social History   Social History  . Marital Status: Married    Spouse Name: N/A  . Number of Children: 3  . Years of Education: N/A   Occupational History  . Not on file.   Social History Main Topics  . Smoking status: Never Smoker   . Smokeless tobacco: Never Used  . Alcohol Use: No  . Drug Use: No  . Sexual Activity: Not on file   Other Topics Concern  . Not on file   Social History Narrative   Lives at home with husband.      Filed Vitals:   07/02/15 1256  BP: 148/92  Pulse: 85  Height: 5\' 2"  (1.575 m)  Weight: 196 lb (88.905 kg)  SpO2: 92%    PHYSICAL EXAM General: NAD HEENT: Normal. Neck: No JVD, no thyromegaly. Lungs: Clear to auscultation bilaterally with normal respiratory effort. CV: HR 88-92 bpm, irregular rhythm, normal S1/S2, no S3, no murmur. No pretibial or periankle edema.   Abdomen: Soft, obese.  Neurologic: Alert and oriented x 3.  Psych: Normal affect. Skin: Normal. Musculoskeletal: No gross deformities. Extremities: No clubbing or cyanosis.   ECG: Most recent ECG reviewed.      ASSESSMENT AND PLAN: 1. Chest discomfort/shortness of breath: Normal stress test on 06/20/15. SOB likely related to elevated HR with atrial fibrillation.  2. Atrial fibrillation: HR at upper normal limits and likely increasing fairly rapidly with exertion. Will increase metoprolol tartrate to 125 mg bid. Anticoagulated with  Eliquis 5 mg bid.   3. Essential HTN: Elevated. Monitor given increased dose of metoprolol.  4. Hyperlipidemia: On Lipitor 40 mg.  5. Chronic diastolic heart failure: Euvolemic on Lasix 20 mg bid. Aim to control HR to prevent decompensation.  Dispo: f/u 2 months.  Kate Sable, M.D., F.A.C.C.

## 2015-07-02 NOTE — Patient Instructions (Addendum)
   Increase your Lopressor to 125mg  twice a day.  You can take the 100mg  tabs that you already have at home with the 25mg  tab (sent to pharmacy today) twice a day.   Continue all other medications.   Follow up in  2 months

## 2015-07-03 ENCOUNTER — Ambulatory Visit: Payer: Medicare Other | Admitting: Cardiology

## 2015-07-05 ENCOUNTER — Ambulatory Visit: Payer: Medicare Other | Attending: Family Medicine | Admitting: Physical Therapy

## 2015-07-05 DIAGNOSIS — M545 Low back pain, unspecified: Secondary | ICD-10-CM

## 2015-07-05 DIAGNOSIS — R5381 Other malaise: Secondary | ICD-10-CM

## 2015-07-05 DIAGNOSIS — G8929 Other chronic pain: Secondary | ICD-10-CM | POA: Insufficient documentation
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
## 2015-07-06 ENCOUNTER — Telehealth: Payer: Self-pay | Admitting: Family Medicine

## 2015-07-06 ENCOUNTER — Ambulatory Visit (INDEPENDENT_AMBULATORY_CARE_PROVIDER_SITE_OTHER): Payer: Medicare Other | Admitting: Psychiatry

## 2015-07-06 VITALS — BP 90/56 | HR 64 | Ht 62.0 in | Wt 196.0 lb

## 2015-07-06 DIAGNOSIS — F3174 Bipolar disorder, in full remission, most recent episode manic: Secondary | ICD-10-CM | POA: Diagnosis not present

## 2015-07-06 MED ORDER — TEMAZEPAM 15 MG PO CAPS: 15.0000 mg | ORAL_CAPSULE | Freq: Every evening | ORAL | Status: DC | PRN

## 2015-07-06 MED ORDER — ARIPIPRAZOLE 15 MG PO TABS: 15.0000 mg | ORAL_TABLET | Freq: Every day | ORAL | Status: DC
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
## 2015-07-06 NOTE — Telephone Encounter (Signed)
Stp and she was seen with psych this morning and her BP was 90/56 and it is now 92/72 and pt states she is feeling dizzy and lightheaded. Pt states she has only had 8 oz of water to drink all day. Advised pt to push fluids and continue to monitor BP if she continues to have symptoms and BP is still low to CB. Pt voiced understanding.    Spoke with Dr.Dettinger regarding this pt and he advised to only take 100mg  of metoprolol BID instead of 125mg  metoprolol and monitor, pt was advised and voiced understanding.

## 2015-07-09 ENCOUNTER — Telehealth: Payer: Self-pay | Admitting: Family Medicine

## 2015-07-09 MED ORDER — FLUCONAZOLE 150 MG PO TABS: 150.0000 mg | ORAL_TABLET | Freq: Once | ORAL | Status: DC

## 2015-07-09 NOTE — Telephone Encounter (Signed)
Pt aware.

## 2015-07-09 NOTE — Telephone Encounter (Signed)
Sending in diflucan for presumed yeast infection.   Laroy Apple, MD [facility] 07/09/2015, 12:23 PM

## 2015-07-12 ENCOUNTER — Telehealth: Payer: Self-pay | Admitting: Family Medicine

## 2015-07-12 DIAGNOSIS — N949 Unspecified condition associated with female genital organs and menstrual cycle: Secondary | ICD-10-CM | POA: Insufficient documentation

## 2015-07-12 NOTE — Telephone Encounter (Signed)
Pt aware.

## 2015-07-12 NOTE — Telephone Encounter (Signed)
Referral written.   I recommend [facility] in [place].   Laroy Apple, MD [facility] 07/12/2015, 4:30 PM

## 2015-07-13 ENCOUNTER — Encounter: Payer: Self-pay | Admitting: Physical Therapy

## 2015-07-13 ENCOUNTER — Ambulatory Visit: Payer: Medicare Other | Admitting: Physical Therapy

## 2015-07-13 DIAGNOSIS — R5381 Other malaise: Secondary | ICD-10-CM

## 2015-07-13 DIAGNOSIS — G8929 Other chronic pain: Secondary | ICD-10-CM | POA: Diagnosis not present

## 2015-07-13 DIAGNOSIS — M545 Low back pain: Principal | ICD-10-CM

## 2015-07-13 NOTE — Therapy (Signed)
[facility] [place], [place], [address] Phone: [phone]   Fax:  [phone]  Physical Therapy Treatment  Patient Details  Name: Amber Stephenson MRN: [id number] Date of Birth: 1946-05-01 Referring Provider: Kenn File MD  Encounter Date: 07/13/2015      PT End of Session - 07/13/15 1035    Visit Number 2   Number of Visits 12   Date for PT Re-Evaluation 08/16/15   PT Start Time [id number]   PT Stop Time 1129   PT Time Calculation (min) 54 min   Activity Tolerance Patient tolerated treatment well   Behavior During Therapy [facility] for tasks assessed/performed      Past Medical History  Diagnosis Date  . Diabetes mellitus without complication ([place])   . Hyperlipidemia   . Hypertension   . Bipolar affective ([place])   . Atrial fibrillation ([place])   . Chronic back pain   . Pain management   . CHF (congestive heart failure) ([place])   . Renal insufficiency     Past Surgical History  Procedure Laterality Date  . Thigh surgery    . Shoulder surgery Right   . Breast reduction surgery      There were no vitals filed for this visit.  Visit Diagnosis:  Chronic right-sided low back pain without sciatica  Debility      Subjective Assessment - 07/13/15 1034    Subjective Low back pain increased today per patient report.   Limitations House hold activities   Patient Stated Goals Would like to decrease my pain for a better quality of life.   Currently in Pain? Yes   Pain Score 8    Pain Location Back   Pain Orientation Right;Lower   Pain Descriptors / Indicators Throbbing   Pain Onset More than a month ago   Pain Frequency Constant            [facility] PT Assessment - 07/13/15 0001    Assessment   Medical Diagnosis Chronic low back pain.   Precautions   Precautions None   Restrictions   Weight Bearing Restrictions No                     [facility] Adult PT Treatment/Exercise - 07/13/15 0001    Modalities    Modalities Electrical Stimulation;Moist Heat;Ultrasound   Moist Heat Therapy   Number Minutes Moist Heat 15 Minutes   Moist Heat Location Lumbar Spine   Electrical Stimulation   Electrical Stimulation Location R lumbar paraspinals/ Glute  Tolerated stim/heat better in sitting   Electrical Stimulation Action Pre-Mod  2 channels   Electrical Stimulation Parameters 80-150 Hz x15 min   Electrical Stimulation Goals Pain;Tone   Ultrasound   Ultrasound Location R lumbar paraspinals/ Glute   Ultrasound Parameters 1.5 w/cm2, 100%,1 mhz x10 min   Ultrasound Goals Pain   Manual Therapy   Manual Therapy Myofascial release   Myofascial Release MFR/TPR to R lumbar paraspinals/ Glutes in L sidelying to decrease pain and tightness                  PT Short Term Goals - 07/05/15 1927    PT SHORT TERM GOAL #1   Title Ind with a HEP.   Time 2   Period Weeks   Status New           PT Long Term Goals - 07/05/15 1927    PT LONG TERM GOAL #1   Title Perform ADL's with pain  not > 3-4/10.   Time 6   Period Weeks   Status New   PT LONG TERM GOAL #2   Title Sit 30 minutes with pain not > 3-4/10.   Time 6   Period Weeks   Status New   PT LONG TERM GOAL #3   Title Transition from sit to stand with pain not > 4/10.   Time 6   Period Weeks   Status New               Plan - 07/13/15 1232    Clinical Impression Statement Patient tolerated today's treatment fairly well although she presented in clinic with increased pain and tightness. Patient tolerated L sidelying well during [place] and manual therapy with no complaints with pillow between her knees for comfort. Normal modalities response noted following removal of the modalities. Patient presented with increased tightness in R lumbar paraspinals and gluteus maximus with TPs noted throughout the treated region. Stimulation and moist heat was first attempted in supine with wedge for LE support but due to sinus congestion patient  requested to be moved to a different position and patient tolerated stitting much better.    Pt will benefit from skilled therapeutic intervention in order to improve on the following deficits Pain;Decreased activity tolerance;Decreased range of motion   Rehab Potential Good   PT Frequency 2x / week   PT Duration 6 weeks   PT Treatment/Interventions ADLs/Self Care Home Management;Electrical Stimulation;Moist Heat;Therapeutic exercise;Therapeutic activities;Ultrasound;Patient/family education;Manual techniques   PT Next Visit Plan Modalites and STW/M to patient's affected low back region right > left.  Progress into low-level core exercises.   Consulted and Agree with Plan of Care Patient        Problem List Patient Active Problem List   Diagnosis Date Noted  . Genital lesion, female 07/12/2015  . Chronic anticoagulation 05/30/2015  . Family history of coronary artery disease in sister 05/30/2015  . Acute on chronic diastolic CHF (congestive heart failure), NYHA class 1 ([place]) 05/30/2015  . Chest pain with moderate risk of acute coronary syndrome 05/29/2015  . Pain in the chest   . Dyspnea   . Essential hypertension   . RLS (restless legs syndrome) 04/27/2015  . Fall 02/26/2015  . Back pain 02/26/2015  . Acute low back pain due to trauma 02/26/2015  . Lipoma 02/08/2015  . Bipolar disorder ([place]) 01/23/2015  . Insomnia 01/23/2015  . CKD stage 3 due to type 2 diabetes mellitus ([place]) 01/05/2015  . Chronic back pain 01/04/2015  . Permanent atrial fibrillation ([place]) 01/04/2015  . Pruritus 01/04/2015  . Diabetes mellitus with stage 2 chronic kidney disease ([place])   . Hyperlipidemia     Wynelle Fanny, PTA 07/13/2015, 12:40 PM  [facility] [address] [place], [place], [address] Phone: [phone]   Fax:  [phone]  Name: Amber Stephenson MRN: [id number] Date of Birth: 1946/03/18

## 2015-07-16 ENCOUNTER — Encounter: Payer: Self-pay | Admitting: Obstetrics and Gynecology

## 2015-07-16 ENCOUNTER — Ambulatory Visit (INDEPENDENT_AMBULATORY_CARE_PROVIDER_SITE_OTHER): Payer: Medicare Other | Admitting: Obstetrics and Gynecology

## 2015-07-16 VITALS — BP 100/60 | Ht 62.0 in | Wt 199.0 lb

## 2015-07-16 DIAGNOSIS — A609 Anogenital herpesviral infection, unspecified: Secondary | ICD-10-CM

## 2015-07-16 DIAGNOSIS — A6009 Herpesviral infection of other urogenital tract: Secondary | ICD-10-CM

## 2015-07-16 HISTORY — DX: Herpesviral infection of other urogenital tract: A60.09

## 2015-07-16 MED ORDER — ACYCLOVIR 400 MG PO TABS: 400.0000 mg | ORAL_TABLET | Freq: Every day | ORAL | Status: AC

## 2015-07-16 MED ORDER — ACYCLOVIR 5 % EX OINT: 1.0000 "application " | TOPICAL_OINTMENT | CUTANEOUS | Status: DC

## 2015-07-16 NOTE — Addendum Note (Signed)
Addended by: Farley Ly on: 07/16/2015 05:13 PM   Modules accepted: Orders

## 2015-07-16 NOTE — Patient Instructions (Signed)
Genital Herpes Genital herpes is a common sexually transmitted infection (STI) that is caused by a virus. The virus is spread from person to person through sexual contact. Infection can cause itching, blisters, and sores in the genital area or rectal area. This is called an outbreak. It affects both men and women. Genital herpes is particularly concerning for pregnant women because the virus can be passed to the baby during delivery and cause serious problems. Genital herpes is also a concern for people with a weakened defense (immune) system. Symptoms of genital herpes may last several days and then go away. However, the virus remains in your body, so you may have more outbreaks of symptoms in the future. The time between outbreaks varies and can be months or years. CAUSES Genital herpes is caused by a virus called herpes simplex virus (HSV) type 2 or HSV type 1. These viruses are contagious and are most often spread through sexual contact with an infected person. Sexual contact includes vaginal, anal, and oral sex. RISK FACTORS Risk factors for genital herpes include:  Being sexually active with multiple partners.  Having unprotected sex. SIGNS AND SYMPTOMS Symptoms may include:  Pain and itching in the genital area or rectal area.  Small red bumps that turn into blisters and then turn into sores.  Flu-like symptoms, including:  Fever.  Body aches.  Painful urination.  Vaginal discharge. DIAGNOSIS Genital herpes may be diagnosed by:  Physical exam.  Blood test.  Fluid culture test from an open sore. TREATMENT There is no cure for genital herpes. Oral antiviral medicines may be used to speed up healing and to help prevent the return of symptoms. These medicines can also help to reduce the spread of the virus to sexual partners. HOME CARE INSTRUCTIONS  Keep the affected areas dry and clean.  Take medicines only as directed by your health care provider.  Do not have sexual  contact during active infections. Genital herpes is contagious.  Practice safe sex. Latex condoms and female condoms may help to prevent the spread of the herpes virus.  Avoid rubbing or touching the blisters and sores. If you do touch the blister or sores:  Wash your hands thoroughly.  Do not touch your eyes afterward.  If you become pregnant, tell your health care provider if you have had genital herpes.  Keep all follow-up visits as directed by your health care provider. This is important. PREVENTION  Use condoms. Although anyone can contract genital herpes during sexual contact even with the use of a condom, a condom can provide some protection.  Avoid having multiple sexual partners.  Talk to your sexual partner about any symptoms and past history that either of you may have.  Get tested before you have sex. Ask your partner to do the same.  Recognize the symptoms of genital herpes. Do not have sexual contact if you notice these symptoms. SEEK MEDICAL CARE IF:  Your symptoms are not improving with medicine.  Your symptoms return.  You have new symptoms.  You have a fever.  You have abdominal pain.  You have redness, swelling, or pain in your eye. MAKE SURE YOU:  Understand these instructions.  Will watch your condition.  Will get help right away if you are not doing well or get worse.   This information is not intended to replace advice given to you by your health care provider. Make sure you discuss any questions you have with your health care provider.   Document Released: 05/16/2000   Document Revised: 06/09/2014 Document Reviewed: 10/04/2013 Elsevier Interactive Patient Education 2016 Elsevier Inc.  

## 2015-07-16 NOTE — Progress Notes (Signed)
[facility] Visit  Patient name: Amber Stephenson MRN [id number]  Date of birth: Oct 18, 1945  CC & HPI:  Amber Stephenson is a 69 y.o. female with a history of DM, presenting today for vaginal itching and swelling that began 2 weeks ago. Patient had seen her PCP and was negative for a UTI. Patient takes Lantus at night. She checks her blood sugar in the morning, which typically runs from 100-130.   Patient notes a small area of pain, redness, and swelling on her left buttock that she first noticed 4 days ago, in addition to a similar area on her chest. Patient had applied lavender oil to the area, which alleviated the swelling. Patient states she had a shingle vaccine 2 years ago.    ROS:  A complete 10 system review of systems was obtained and all systems are negative except as noted in the HPI and PMH.    Pertinent History Reviewed:   Reviewed: Significant for DM, hyperlipidemia, and hypertension Medical         Past Medical History  Diagnosis Date  . Diabetes mellitus without complication ([place])   . Hyperlipidemia   . Hypertension   . Bipolar affective ([place])   . Atrial fibrillation ([place])   . Chronic back pain   . Pain management   . CHF (congestive heart failure) ([place])   . Renal insufficiency                               Surgical Hx:    Past Surgical History  Procedure Laterality Date  . Thigh surgery    . Shoulder surgery Right   . Breast reduction surgery     Medications: Reviewed & Updated - see associated section                       Current outpatient prescriptions:  .  apixaban (ELIQUIS) 5 MG TABS tablet, Take 5 mg by mouth 2 (two) times daily., Disp: , Rfl:  .  ARIPiprazole (ABILIFY) 15 MG tablet, Take 1 tablet (15 mg total) by mouth daily., Disp: 90 tablet, Rfl: 1 .  atorvastatin (LIPITOR) 40 MG tablet, Take 1 tablet (40 mg total) by mouth daily., Disp: 90 tablet, Rfl: 3 .  Blood Glucose Monitoring Suppl (FREESTYLE LITE) DEVI, Use to check blood sugar  tid. DX E11.22, Disp: 1 each, Rfl: 0 .  Cholecalciferol (VITAMIN D3) 1000 UNITS CAPS, Take 1,000 Units by mouth daily., Disp: , Rfl:  .  fenofibrate (TRICOR) 145 MG tablet, Take 145 mg by mouth daily., Disp: , Rfl:  .  furosemide (LASIX) 20 MG tablet, Take 20 mg by mouth 2 (two) times daily., Disp: , Rfl:  .  glucose blood (FREESTYLE LITE) test strip, Test tid. DX E11.22, Disp: 100 each, Rfl: 5 .  ibuprofen (ADVIL,MOTRIN) 800 MG tablet, TAKE ONE TABLET BY MOUTH 3 TIMES DAILY AS NEEDED FOR PAIN, Disp: , Rfl: 0 .  Insulin Glargine (LANTUS SOLOSTAR) 100 UNIT/ML Solostar Pen, Inject 56 Units into the skin at bedtime. , Disp: , Rfl:  .  Lancets (FREESTYLE) lancets, Test tid. DX E11.22, Disp: 100 each, Rfl: 5 .  lidocaine (LIDODERM) 5 %, Place 1 patch onto the skin 2 (two) times daily., Disp: 30 patch, Rfl: 5 .  lidocaine (XYLOCAINE) 2 % solution, Use as directed 20 mLs in the mouth or throat as needed for mouth pain., Disp: 100 mL, Rfl:  0 .  lisinopril (PRINIVIL,ZESTRIL) 10 MG tablet, Take 1 tablet (10 mg total) by mouth daily., Disp: 30 tablet, Rfl: 3 .  metoprolol (LOPRESSOR) 100 MG tablet, Take 1 tablet (100 mg total) by mouth 2 (two) times daily., Disp: 60 tablet, Rfl: 5 .  metoprolol tartrate (LOPRESSOR) 25 MG tablet, Take 1 tablet (25 mg total) by mouth 2 (two) times daily., Disp: 60 tablet, Rfl: 6 .  oxyCODONE-acetaminophen (PERCOCET) 10-325 MG tablet, TAKE ONE TABLET BY MOUTH FOUR TIMES A DAY AS NEEDED FOR 30 DAYS, Disp: , Rfl: 0 .  potassium chloride (K-DUR,KLOR-CON) 10 MEQ tablet, TAKE 1 TABLET 2 TIMES DAILY WITH FOOD, Disp: , Rfl: 0 .  pregabalin (LYRICA) 50 MG capsule, 1 pill morning and afternoon, 2 pills before bed (Patient taking differently: Take 50-100 mg by mouth 2 (two) times daily. 1 pill morning and afternoon, 2 pills before bed), Disp: 360 capsule, Rfl: 1 .  SUMAtriptan (IMITREX) 100 MG tablet, Take 1 tablet (100 mg total) by mouth See admin instructions., Disp: 9 tablet, Rfl: 2 .   temazepam (RESTORIL) 15 MG capsule, Take 1 capsule (15 mg total) by mouth at bedtime as needed for sleep., Disp: 30 capsule, Rfl: 5 .  tiZANidine (ZANAFLEX) 4 MG tablet, Take 1 tablet (4 mg total) by mouth 2 (two) times daily., Disp: 60 tablet, Rfl: 2 .  traMADol (ULTRAM) 50 MG tablet, Take 1 tablet (50 mg total) by mouth every 6 (six) hours as needed. for pain, Disp: 60 tablet, Rfl: 2   Social History: Reviewed -  reports that she has never smoked. She has never used smokeless tobacco.  Objective Findings:  Vitals: Blood pressure 100/60, height 5\' 2"  (1.575 m), weight 199 lb (90.266 kg).  Physical Examination: General appearance - alert, well appearing, and in no distress, oriented to person, place, and time and overweight Mental status - alert, oriented to person, place, and time, normal mood, behavior, speech, dress, motor activity, and thought processes, affect appropriate to mood Pelvic exam:  VULVA: Ulcerative lesions on the left labia majora Skin - line of 4 red bumps on left buttock that are tender    Assessment & Plan:   A:  1. 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 Culture collected from open lesion.  P:   1. Rx acyclovir 5x/day for 5 days. 2 f/u 2 wk for discuss results and explain to partner of 43 yr. Neither partner suspected of affairs.   By signing my name below, I, [name], attest that this documentation has been prepared under the direction and in the presence of Jonnie Kind, MD. Electronically Signed: [name], ED Scribe. 07/16/2015. 3:34 PM.  I personally performed the services described in this documentation, which was SCRIBED in my presence. The recorded information has been reviewed and considered accurate. It has been edited as necessary during review. Jonnie Kind, MD

## 2015-07-16 NOTE — Progress Notes (Signed)
Patient ID: Amber Stephenson, female   DOB: 1945/12/09, 70 y.o.   MRN: [id number] Pt here today for vaginal irritation. Pt states that there is no discharge. Pt states that she has had a lot of swelling and terrible itching.

## 2015-07-17 ENCOUNTER — Ambulatory Visit (INDEPENDENT_AMBULATORY_CARE_PROVIDER_SITE_OTHER): Payer: Medicare Other | Admitting: Family Medicine

## 2015-07-17 ENCOUNTER — Encounter: Payer: Self-pay | Admitting: Family Medicine

## 2015-07-17 VITALS — BP 130/95 | HR 88 | Temp 97.3°F | Ht 62.0 in | Wt 197.4 lb

## 2015-07-17 DIAGNOSIS — A609 Anogenital herpesviral infection, unspecified: Secondary | ICD-10-CM | POA: Diagnosis not present

## 2015-07-17 DIAGNOSIS — R203 Hyperesthesia: Secondary | ICD-10-CM

## 2015-07-17 NOTE — Progress Notes (Signed)
   HPI  Patient presents today here today with concerns for shingles.  Patient explains that on her left side of her chest for the last 2 weeks she feels that there is a burning sensation under the skin. She has increased sensitivity to the touch.  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 She's been treated for this only for about 2 days now.  She denies any worsening of the chest discomfort with walking, any new shortness of breath, any lightheadedness, sweating, or dizziness with the symptoms.  She has not developed a rash.  This is very different than previous cardiac related symptoms that she had which were described as severe dyspnea  PMH: Smoking status noted ROS: Per HPI  Objective: BP 130/95 mmHg  Pulse 88  Temp(Src) 97.3 F (36.3 C) (Oral)  Ht 5\' 2"  (1.575 m)  Wt 197 lb 6.4 oz (89.54 kg)  BMI 36.10 kg/m2 Gen: NAD, alert, cooperative with exam HEENT: NCAT CV: RRR, good S1/S2, no murmur Resp: CTABL, no wheezes, non-labored Ext: No edema, warm Neuro: Alert and oriented, No gross deficits  Skin no rash on the left chest  Assessment and plan:  # sensitive skin Concern for shingles outbreak,I provided reassurance, she is on acyclovir this is shingles and will improve with acyclovir. She has no red flags for cardiac related pain at this time, I discussed with her a very low threshold for seeking medicalcare if these develop. Follow-up as needed    Laroy Apple, MD [facility] 07/17/2015, 4:11 PM

## 2015-07-17 NOTE — Patient Instructions (Signed)
Great to see you!   

## 2015-07-18 ENCOUNTER — Ambulatory Visit: Payer: Medicare Other | Admitting: Cardiovascular Disease

## 2015-07-20 ENCOUNTER — Ambulatory Visit (INDEPENDENT_AMBULATORY_CARE_PROVIDER_SITE_OTHER): Payer: Medicare Other | Admitting: Cardiovascular Disease

## 2015-07-20 ENCOUNTER — Encounter: Payer: Self-pay | Admitting: Cardiovascular Disease

## 2015-07-20 VITALS — BP 158/90 | HR 69 | Ht 62.0 in | Wt 203.0 lb

## 2015-07-20 DIAGNOSIS — I9589 Other hypotension: Secondary | ICD-10-CM

## 2015-07-20 DIAGNOSIS — I4891 Unspecified atrial fibrillation: Secondary | ICD-10-CM | POA: Diagnosis not present

## 2015-07-20 DIAGNOSIS — R0602 Shortness of breath: Secondary | ICD-10-CM

## 2015-07-20 DIAGNOSIS — E785 Hyperlipidemia, unspecified: Secondary | ICD-10-CM | POA: Diagnosis not present

## 2015-07-20 DIAGNOSIS — I10 Essential (primary) hypertension: Secondary | ICD-10-CM | POA: Diagnosis not present

## 2015-07-20 LAB — HERPES SIMPLEX VIRUS CULTURE

## 2015-07-20 NOTE — Patient Instructions (Signed)
Your physician recommends that you schedule a follow-up appointment in: 4 months with Dr. Bronson Ing  Your physician recommends that you continue on your current medications as directed. Please refer to the Current Medication list given to you today.  Your physician has requested that you regularly monitor and record your blood pressure readings at home 3 TIMES WEEKLY FOR 2 MONTHS AND BRING OR CALL INTO OFFICE.   Thank you for choosing [facility]!!

## 2015-07-20 NOTE — Progress Notes (Signed)
Patient ID: Amber Stephenson, female   DOB: August 04, 1945, 70 y.o.   MRN: [id number]      SUBJECTIVE: The patient returns for follow-up after medication adjustments made 2 weeks ago. At that time I increased her metoprolol to 125 mg twice daily. Of note, nuclear stress testing on 06/20/15 was normal. She has a history of anxiety and bipolar disorder and follows with psychiatry.  She is currently being treated for herpes zoster and is only 4 days into treatment. Approximately two weeks ago while she was at her psychiatrist's office , she tells me her blood pressure was very low and she felt lethargic. She does not actually remember the values. A few days afterwards she began taking her normal doses of lisinopril 10 mg daily and metoprolol 125 mg twice daily. She has not had any recurrence of symptoms.   Review of Systems: As per "subjective", otherwise negative.  Allergies  Allergen Reactions  . Ivp Dye [Iodinated Diagnostic Agents] Swelling    Throat closes    Current Outpatient Prescriptions  Medication Sig Dispense Refill  . acyclovir (ZOVIRAX) 400 MG tablet Take 1 tablet (400 mg total) by mouth 5 (five) times daily. 25 tablet 2  . acyclovir ointment (ZOVIRAX) 5 % Apply 1 application topically every 3 (three) hours. 15 g 1  . apixaban (ELIQUIS) 5 MG TABS tablet Take 5 mg by mouth 2 (two) times daily.    . ARIPiprazole (ABILIFY) 15 MG tablet Take 1 tablet (15 mg total) by mouth daily. 90 tablet 1  . atorvastatin (LIPITOR) 40 MG tablet Take 1 tablet (40 mg total) by mouth daily. 90 tablet 3  . Blood Glucose Monitoring Suppl (FREESTYLE LITE) DEVI Use to check blood sugar tid. DX E11.22 1 each 0  . Cholecalciferol (VITAMIN D3) 1000 UNITS CAPS Take 1,000 Units by mouth daily.    . fenofibrate (TRICOR) 145 MG tablet Take 145 mg by mouth daily.    . furosemide (LASIX) 20 MG tablet Take 20 mg by mouth 2 (two) times daily.    Marland Kitchen glucose blood (FREESTYLE LITE) test strip Test tid. DX E11.22 100 each 5    . ibuprofen (ADVIL,MOTRIN) 800 MG tablet TAKE ONE TABLET BY MOUTH 3 TIMES DAILY AS NEEDED FOR PAIN  0  . Insulin Glargine (LANTUS SOLOSTAR) 100 UNIT/ML Solostar Pen Inject 56 Units into the skin at bedtime.     . Lancets (FREESTYLE) lancets Test tid. DX E11.22 100 each 5  . lidocaine (LIDODERM) 5 % Place 1 patch onto the skin 2 (two) times daily. 30 patch 5  . lidocaine (XYLOCAINE) 2 % solution Use as directed 20 mLs in the mouth or throat as needed for mouth pain. 100 mL 0  . lisinopril (PRINIVIL,ZESTRIL) 10 MG tablet Take 1 tablet (10 mg total) by mouth daily. 30 tablet 3  . metoprolol (LOPRESSOR) 100 MG tablet Take 1 tablet (100 mg total) by mouth 2 (two) times daily. 60 tablet 5  . metoprolol tartrate (LOPRESSOR) 25 MG tablet Take 1 tablet (25 mg total) by mouth 2 (two) times daily. 60 tablet 6  . oxyCODONE-acetaminophen (PERCOCET) 10-325 MG tablet TAKE ONE TABLET BY MOUTH FOUR TIMES A DAY AS NEEDED FOR 30 DAYS  0  . potassium chloride (K-DUR,KLOR-CON) 10 MEQ tablet TAKE 1 TABLET 2 TIMES DAILY WITH FOOD  0  . pregabalin (LYRICA) 50 MG capsule 1 pill morning and afternoon, 2 pills before bed (Patient taking differently: Take 50-100 mg by mouth 2 (two) times daily. 1 pill morning  and afternoon, 2 pills before bed) 360 capsule 1  . SUMAtriptan (IMITREX) 100 MG tablet Take 1 tablet (100 mg total) by mouth See admin instructions. 9 tablet 2  . temazepam (RESTORIL) 15 MG capsule Take 1 capsule (15 mg total) by mouth at bedtime as needed for sleep. 30 capsule 5  . tiZANidine (ZANAFLEX) 4 MG tablet Take 1 tablet (4 mg total) by mouth 2 (two) times daily. 60 tablet 2  . traMADol (ULTRAM) 50 MG tablet Take 1 tablet (50 mg total) by mouth every 6 (six) hours as needed. for pain 60 tablet 2   No current facility-administered medications for this visit.    Past Medical History  Diagnosis Date  . Diabetes mellitus without complication ([place])   . Hyperlipidemia   . Hypertension   . Bipolar affective  ([place])   . Atrial fibrillation ([place])   . Chronic back pain   . Pain management   . CHF (congestive heart failure) ([place])   . Renal insufficiency     Past Surgical History  Procedure Laterality Date  . Thigh surgery    . Shoulder surgery Right   . Breast reduction surgery      Social History   Social History  . Marital Status: Married    Spouse Name: N/A  . Number of Children: 3  . Years of Education: N/A   Occupational History  . Not on file.   Social History Main Topics  . Smoking status: Never Smoker   . Smokeless tobacco: Never Used  . Alcohol Use: No  . Drug Use: No  . Sexual Activity: Yes    Birth Control/ Protection: Post-menopausal   Other Topics Concern  . Not on file   Social History Narrative   Lives at home with husband.      Filed Vitals:   07/20/15 1136  BP: 158/90  Pulse: 69  Height: 5\' 2"  (1.575 m)  Weight: 203 lb (92.08 kg)  SpO2: 97%    PHYSICAL EXAM General: NAD HEENT: Normal. Neck: No JVD, no thyromegaly. Lungs: Clear to auscultation bilaterally with normal respiratory effort. CV: HR normal, irregular rhythm, normal S1/S2, no S3, no murmur. No pretibial or periankle edema.  Abdomen: Soft, obese.  Neurologic: Alert and oriented x 3.  Psych: Normal affect. Skin: Normal. Musculoskeletal: No gross deformities. Extremities: No clubbing or cyanosis.   ECG: Most recent ECG reviewed.      ASSESSMENT AND PLAN: 1. Chest discomfort/shortness of breath: Normal stress test on 06/20/15. SOB likely related to elevated HR with atrial fibrillation which is now improved since increase of metoprolol.  2. Atrial fibrillation: HR now controlled on metoprolol tartrate 125 mg bid. Anticoagulated with Eliquis 5 mg bid.   3. Essential HTN: Elevated today but had low BP two weeks ago in context of herpes zoster. Will have her begin monitoring BP three times per week for 8 weeks to see if additional adjustments need to be made.  4. Hyperlipidemia:  On Lipitor 40 mg.  5. Chronic diastolic heart failure: Euvolemic on Lasix 20 mg bid. Aim to control HR and BP to prevent decompensation.  Dispo: f/u 4 months.   Kate Sable, M.D., F.A.C.C.

## 2015-07-23 ENCOUNTER — Ambulatory Visit: Payer: Medicare Other | Admitting: Family Medicine

## 2015-07-23 ENCOUNTER — Telehealth: Payer: Self-pay | Admitting: Endocrinology

## 2015-07-23 ENCOUNTER — Encounter: Payer: Self-pay | Admitting: Endocrinology

## 2015-07-23 ENCOUNTER — Ambulatory Visit (INDEPENDENT_AMBULATORY_CARE_PROVIDER_SITE_OTHER): Payer: Medicare Other | Admitting: Endocrinology

## 2015-07-23 VITALS — BP 116/78 | HR 67 | Temp 97.8°F | Resp 16 | Ht 62.0 in | Wt 202.4 lb

## 2015-07-23 DIAGNOSIS — E1165 Type 2 diabetes mellitus with hyperglycemia: Secondary | ICD-10-CM | POA: Diagnosis not present

## 2015-07-23 DIAGNOSIS — E1142 Type 2 diabetes mellitus with diabetic polyneuropathy: Secondary | ICD-10-CM

## 2015-07-23 DIAGNOSIS — Z794 Long term (current) use of insulin: Secondary | ICD-10-CM

## 2015-07-23 LAB — POCT URINALYSIS DIPSTICK
Blood, UA: NEGATIVE
Glucose, UA: NEGATIVE
Ketones, UA: NEGATIVE
Nitrite, UA: NEGATIVE
Protein, UA: NEGATIVE
Spec Grav, UA: 1.015
Urobilinogen, UA: 0.2
pH, UA: 6

## 2015-07-23 LAB — BASIC METABOLIC PANEL
BUN: 35 mg/dL — ABNORMAL HIGH (ref 6–23)
CO2: 26 mEq/L (ref 19–32)
Calcium: 9.7 mg/dL (ref 8.4–10.5)
Chloride: 104 mEq/L (ref 96–112)
Creatinine, Ser: 1.57 mg/dL — ABNORMAL HIGH (ref 0.40–1.20)
GFR: 34.81 mL/min — ABNORMAL LOW (ref >60.00)
Glucose, Bld: 168 mg/dL — ABNORMAL HIGH (ref 70–99)
Potassium: 4.8 mEq/L (ref 3.5–5.1)
Sodium: 137 mEq/L (ref 135–145)

## 2015-07-23 LAB — MICROALBUMIN / CREATININE URINE RATIO
Creatinine,U: 43.8 mg/dL
Microalb Creat Ratio: 4.3 mg/g (ref 0.0–30.0)
Microalb, Ur: 1.9 mg/dL (ref 0.0–1.9)

## 2015-07-23 MED ORDER — VICTOZA 18 MG/3ML ~~LOC~~ SOPN: 1.2000 mg | PEN_INJECTOR | Freq: Every day | SUBCUTANEOUS | Status: DC

## 2015-07-23 NOTE — Progress Notes (Signed)
Patient ID: Amber Stephenson, female   DOB: Oct 24, 1945, 70 y.o.   MRN: [id number]  .          Reason for Appointment: Consultation for Type 2 Diabetes  Referring physician: Wendi Snipes  History of Present Illness:          Date of diagnosis of type 2 diabetes mellitus:   2007       Background history:   She was diagnosed to have diabetes when she was having weakness and fainting episodes.  She thinks she was treated with metformin for quite some time but not clear if she took anything else  She was started on Lantus 5 years ago probably because of poor control in [place] She thinks however that even with taking Lantus and metformin her blood sugars were usually high after meals  Her A1c has generally been consistently above 7 even with metformin and Lantus and prior to moving to [place] it was about 8%  Recent history:   INSULIN regimen is:   Lantus 60 units daily at bedtime      Current blood sugar patterns and problems identified:    she is checking her blood sugars only in the mornings usually   She thinks they may be a little higher at times  She was told to stop metformin when her creatinine was higher in October last year  She has difficulty losing weight  Currently not able to exercise much because of back pain  She has been referred here for poor control with A1c going up to 9% and appears to be getting progressively higher  Non-insulin hypoglycemic drugs the patient is taking are:   none     Side effects from medications have been: None  Compliance with the medical regimen: Fair Hypoglycemia:   none  Glucose monitoring:  done 1 times a day         Glucometer:  FreeStyle .      Blood Glucose readings by time of day from recall   PREMEAL Breakfast Lunch Dinner Bedtime  Overall   Glucose range:  100-130   ?     Median:         Self-care: The diet that the patient has been following is: tries to limit drinks which sugar .     Meal times are:  Breakfast  is at Lunch: Dinner:   Typical meal intake: Breakfast is Granola cereal usually.  Usually having salad at lunch, mixed meal at dinnertime.  Snacks are with meat, yogurt, cottage cheese               Dietician visit, most recent: Never               Exercise:  none  Weight history: Her maximum weight previously has been 221, she lost down to 179 with dietary changes  Wt Readings from Last 3 Encounters:  07/23/15 202 lb 6.4 oz (91.808 kg)  07/20/15 203 lb (92.08 kg)  07/17/15 197 lb 6.4 oz (89.54 kg)    Glycemic control:   Lab Results  Component Value Date   HGBA1C 9.0* 05/30/2015   HGBA1C 8.0 03/30/2015   HGBA1C 7.3 01/04/2015   Lab Results  Component Value Date   LDLCALC 71 01/04/2015   CREATININE 1.03* 05/30/2015         Medication List       This list is accurate as of: 07/23/15  1:00 PM.  Always use your most recent med list.  acyclovir 400 MG tablet  Commonly known as:  ZOVIRAX  Take 1 tablet (400 mg total) by mouth 5 (five) times daily.     acyclovir ointment 5 %  Commonly known as:  ZOVIRAX  Apply 1 application topically every 3 (three) hours.     ARIPiprazole 15 MG tablet  Commonly known as:  ABILIFY  Take 1 tablet (15 mg total) by mouth daily.     atorvastatin 40 MG tablet  Commonly known as:  LIPITOR  Take 1 tablet (40 mg total) by mouth daily.     ELIQUIS 5 MG Tabs tablet  Generic drug:  apixaban  Take 5 mg by mouth 2 (two) times daily.     fenofibrate 145 MG tablet  Commonly known as:  TRICOR  Take 145 mg by mouth daily.     freestyle lancets  Test tid. DX E11.22     FREESTYLE LITE Devi  Use to check blood sugar tid. DX E11.22     furosemide 20 MG tablet  Commonly known as:  LASIX  Take 20 mg by mouth 2 (two) times daily.     glucose blood test strip  Commonly known as:  FREESTYLE LITE  Test tid. DX E11.22     ibuprofen 800 MG tablet  Commonly known as:  ADVIL,MOTRIN  TAKE ONE TABLET BY MOUTH 3 TIMES DAILY AS  NEEDED FOR PAIN     LANTUS SOLOSTAR 100 UNIT/ML Solostar Pen  Generic drug:  Insulin Glargine  Inject 60 Units into the skin at bedtime.     lidocaine 2 % solution  Commonly known as:  XYLOCAINE  Use as directed 20 mLs in the mouth or throat as needed for mouth pain.     lidocaine 5 %  Commonly known as:  LIDODERM  Place 1 patch onto the skin 2 (two) times daily.     lisinopril 10 MG tablet  Commonly known as:  ZESTRIL  Take 1 tablet (10 mg total) by mouth daily.     metoprolol 100 MG tablet  Commonly known as:  LOPRESSOR  Take 1 tablet (100 mg total) by mouth 2 (two) times daily.     metoprolol tartrate 25 MG tablet  Commonly known as:  LOPRESSOR  Take 1 tablet (25 mg total) by mouth 2 (two) times daily.     oxyCODONE-acetaminophen 10-325 MG tablet  Commonly known as:  PERCOCET  TAKE ONE TABLET BY MOUTH FOUR TIMES A DAY AS NEEDED FOR 30 DAYS     potassium chloride 10 MEQ tablet  Commonly known as:  K-DUR,KLOR-CON  TAKE 1 TABLET 2 TIMES DAILY WITH FOOD     pregabalin 50 MG capsule  Commonly known as:  LYRICA  1 pill morning and afternoon, 2 pills before bed     SUMAtriptan 100 MG tablet  Commonly known as:  IMITREX  Take 1 tablet (100 mg total) by mouth See admin instructions.     temazepam 15 MG capsule  Commonly known as:  RESTORIL  Take 1 capsule (15 mg total) by mouth at bedtime as needed for sleep.     tiZANidine 4 MG tablet  Commonly known as:  ZANAFLEX  Take 1 tablet (4 mg total) by mouth 2 (two) times daily.     traMADol 50 MG tablet  Commonly known as:  ULTRAM  Take 1 tablet (50 mg total) by mouth every 6 (six) hours as needed. for pain     Vitamin D3 1000 units Caps  Take 1,000 Units by  mouth daily.        Allergies:  Allergies  Allergen Reactions  . Ivp Dye [Iodinated Diagnostic Agents] Swelling    Throat closes    Past Medical History  Diagnosis Date  . Diabetes mellitus without complication ([place])   . Hyperlipidemia   . Hypertension     . Bipolar affective ([place])   . Atrial fibrillation ([place])   . Chronic back pain   . Pain management   . CHF (congestive heart failure) ([place])   . Renal insufficiency     Past Surgical History  Procedure Laterality Date  . Thigh surgery    . Shoulder surgery Right   . Breast reduction surgery      Family History  Problem Relation Age of Onset  . Diabetes Mother   . Heart disease Mother   . Heart disease Brother   . Heart disease Sister     CABG  . Mental illness Brother   . Diabetes Brother     Social History:  reports that she has never smoked. She has never used smokeless tobacco. She reports that she does not drink alcohol or use illicit drugs.    Review of Systems    Lipid history: Has been on fenofibrate and Lipitor for quite some time    Lab Results  Component Value Date   CHOL 163 01/04/2015   HDL 42 01/04/2015   LDLCALC 71 01/04/2015   TRIG 249* 01/04/2015   CHOLHDL 3.9 01/04/2015           Hypertension: Treated with lisinopril and metoprolol.  She is also on high dose metoprolol for her atrial fibrillation  Most recent eye exam was in [place] in 2016  Most recent foot exam:2/17  Review of Systems  Constitutional: Negative for weight loss.  HENT: Negative for headaches.   Eyes: Negative for blurred vision.  Respiratory: Negative for shortness of breath.   Cardiovascular: Negative for chest pain, palpitations, leg swelling and claudication.  Gastrointestinal: Negative for diarrhea.  Endocrine: Negative for general weakness.  Genitourinary: Negative for frequency.  Musculoskeletal: Positive for back pain.       She takes Motrin about 3-4 times a week  Skin: Negative for rash.  Neurological: Positive for tingling and balance difficulty.       Has had pain in her feet described as nerve pain, improved with Lyrica.  Occasionally has some tingling present  Psychiatric/Behavioral: Negative for depressed mood.      LABS:  No visits with results  within 1 Week(s) from this visit. Latest known visit with results is:  Office Visit on 07/16/2015  Component Date Value Ref Range Status  . HSV Culture/Type 07/17/2015 Comment   Final   Comment: Negative No Herpes simplex virus isolated.     Physical Examination:  BP 116/78 mmHg  Pulse 67  Temp(Src) 97.8 F (36.6 C)  Resp 16  Ht 5\' 2"  (1.575 m)  Wt 202 lb 6.4 oz (91.808 kg)  BMI 37.01 kg/m2  SpO2 97%  GENERAL:         Patient has generalized obesity.   HEENT:         Eye exam shows normal external appearance. Fundus exam shows no retinopathy.  Oral exam shows normal mucosa .  NECK:   There is no lymphadenopathy Thyroid is not enlarged and no nodules felt.  Carotids are normal to palpation and no bruit heard LUNGS:         Chest is symmetrical. Lungs are clear to  auscultation.Marland Kitchen   HEART:         Heart sounds:  S1 and S2 are normal. No murmur or click heard., no S3 or S4.   ABDOMEN:   There is no distention present. Liver and spleen are not palpable. No other mass or tenderness present.   NEUROLOGICAL:   Ankle jerks are absent bilaterally.    Diabetic Foot Exam - Simple   Simple Foot Form  Diabetic Foot exam was performed with the following findings:  Yes   Visual Inspection  No deformities, no ulcerations, no other skin breakdown bilaterally:  Yes  Sensation Testing  Intact to touch and monofilament testing bilaterally:  Yes  Pulse Check  See comments:  Yes  Comments  Dorsalis pedis pulse is not palpable             Vibration sense is mildly reduced in distal first toes. MUSCULOSKELETAL:  There is no swelling or deformity of the peripheral joints. Spine is normal to inspection.   EXTREMITIES:     There is no edema. No skin lesions present.Marland Kitchen SKIN:       No rash or lesions of concern.        ASSESSMENT:  Diabetes type 2, uncontrolled with obesity and BMI 37 She has had diabetes 10 years and is currently on basal insulin only with last  A1c 9% Appears to have  postprandial hyperglycemia but she is not documenting this since she is monitoring only in the morning Currently has difficulty losing weight especially with lack of exercise 10%    She is a good candidate for a GLP-1 drug Discussed with the patient the nature of GLP-1 drugs, the actions on various organ systems, how they benefit blood glucose control, as well as the benefit of weight loss and  increase satiety . Explained possible side effects especially nausea and vomiting initially; discussed safety information in package insert.  Described the injection technique and dosage titration of Victoza  starting with 0.6 mg once a day at the same time for the first week and then increasing to 1.2 mg if no symptoms of nausea.  Educational brochure on Victoza   Complications: Peripheral neuropathy, unknown status of retinopathy and nephropathy  History of renal insufficiency related to large doses of nonsteroidal anti-inflammatory drugs  HYPERLIPIDEMIA, has had persistently high triglycerides which may improve with weight loss and better diabetes control  PLAN:    She will join the gym and start currently exercise as tolerated  Check renal function and if creatinine clearance is at least over 40 will start her on metformin  Start Victoza as above.  If she is not able to titrate beyond 0.6 Will consider 0.9 mg.  Also she will check on insurance coverage for [facility]  Discussed need to potentially reduce her Lantus dose by 5 units at a time once fasting glucose readings are below 100  Consultation with dietitian  Start monitoring readings after meals at least once a day and alternate with fasting readings  Try to get some protein with breakfast instead of just cereal  Will check urine microalbumin  To get reports of eye exam   Continue Lyrica for neuropathy  Patient Instructions  Check blood sugars on waking up 3  times a week Also check blood sugars about 2 hours after a meal and do this  after different meals by rotation  Recommended blood sugar levels on waking up is 90-130 and about 2 hours after meal is 130-180  Please bring your  blood sugar monitor to each visit, thank you  Start VICTOZA injection as shown once daily at the same time of the day.   Dial the dose to 0.6 mg on the pen for the first week.  You may inject in the stomach, thigh or arm. You may experience nausea in the first few days which usually goes away.  You will feel fullness of the stomach with starting the medication and should try to keep the portions at meals small.   After 1 week increase the dose to 1.2mg  daily if no nausea present.   If any questions or concerns are present call the office or the [place] helpline at [phone]. Visit [email or web address].info/ for more useful information  Check on Soliqua   Counseling time on subjects discussed above is over 50% of today's 60 minute visit  Yerik Zeringue 07/23/2015, 1:00 PM   Note: This office note was prepared with Estate agent. Any transcriptional errors that result from this process are unintentional.

## 2015-07-23 NOTE — Telephone Encounter (Signed)
Pt called in and said she wasn't sure if Dr. Dwyane Dee called in more metformin for her or not.

## 2015-07-23 NOTE — Patient Instructions (Signed)
Check blood sugars on waking up 3  times a week Also check blood sugars about 2 hours after a meal and do this after different meals by rotation  Recommended blood sugar levels on waking up is 90-130 and about 2 hours after meal is 130-180  Please bring your blood sugar monitor to each visit, thank you  Start VICTOZA injection as shown once daily at the same time of the day.   Dial the dose to 0.6 mg on the pen for the first week.  You may inject in the stomach, thigh or arm. You may experience nausea in the first few days which usually goes away.  You will feel fullness of the stomach with starting the medication and should try to keep the portions at meals small.   After 1 week increase the dose to 1.2mg  daily if no nausea present.   If any questions or concerns are present call the office or the [place] helpline at [phone]. Visit [email or web address].info/ for more useful information  Check on Soliqua

## 2015-07-23 NOTE — Telephone Encounter (Signed)
Waiting on lab work result.  Also please send her urine for urinalysis to the main lab and ask her if she is having any burning, pressure or urgency of urination

## 2015-07-23 NOTE — Telephone Encounter (Signed)
Pt would like to talk to [place].

## 2015-07-23 NOTE — Telephone Encounter (Signed)
Detailed voice message left on patients phone, I asked her to call back about her urine

## 2015-07-23 NOTE — Telephone Encounter (Signed)
Please see below and advise.

## 2015-07-23 NOTE — Addendum Note (Signed)
Addended by: Kaylyn Lim I on: 07/23/2015 01:43 PM   Modules accepted: Orders

## 2015-07-23 NOTE — Addendum Note (Signed)
Addended by: Kaylyn Lim I on: 07/23/2015 02:03 PM   Modules accepted: Orders

## 2015-07-24 ENCOUNTER — Ambulatory Visit: Payer: Medicare Other | Admitting: Physical Therapy

## 2015-07-24 DIAGNOSIS — G8929 Other chronic pain: Secondary | ICD-10-CM

## 2015-07-24 DIAGNOSIS — R5381 Other malaise: Secondary | ICD-10-CM | POA: Diagnosis not present

## 2015-07-24 DIAGNOSIS — M545 Low back pain: Principal | ICD-10-CM

## 2015-07-24 LAB — FRUCTOSAMINE: Fructosamine: 343 umol/L — ABNORMAL HIGH (ref 0–285)

## 2015-07-24 NOTE — Therapy (Signed)
[facility] [place], [place], [address] Phone: [phone]   Fax:  [phone]  Physical Therapy Treatment  Patient Details  Name: Amber Stephenson MRN: [id number] Date of Birth: [birth year] Referring Provider: Kenn File MD  Encounter Date: 07/24/2015      PT End of Session - 07/24/15 1307    Visit Number 3   Number of Visits 12   Date for PT Re-Evaluation 08/16/15   PT Start Time 1307   PT Stop Time 1400   PT Time Calculation (min) 53 min   Activity Tolerance Patient tolerated treatment well   Behavior During Therapy [facility] for tasks assessed/performed      Past Medical History  Diagnosis Date  . Diabetes mellitus without complication ([place])   . Hyperlipidemia   . Hypertension   . Bipolar affective ([place])   . Atrial fibrillation ([place])   . Chronic back pain   . Pain management   . CHF (congestive heart failure) ([place])   . Renal insufficiency     Past Surgical History  Procedure Laterality Date  . Thigh surgery    . Shoulder surgery Right   . Breast reduction surgery      There were no vitals filed for this visit.  Visit Diagnosis:  Chronic right-sided low back pain without sciatica  Debility      Subjective Assessment - 07/24/15 1307    Subjective States that her back pain is increased today all the way across her low back   Limitations House hold activities   Patient Stated Goals Would like to decrease my pain for a better quality of life.   Currently in Pain? Yes   Pain Score 9    Pain Location Back   Pain Orientation Right;Left;Lower   Pain Descriptors / Indicators Aching   Pain Onset More than a month ago            [facility] PT Assessment - 07/24/15 0001    Assessment   Medical Diagnosis Chronic low back pain.   Precautions   Precautions None                     [facility] Adult PT Treatment/Exercise - 07/24/15 0001    Modalities   Modalities Electrical Stimulation;Moist  Heat;Ultrasound   Moist Heat Therapy   Number Minutes Moist Heat 15 Minutes   Moist Heat Location Lumbar Spine   Electrical Stimulation   Electrical Stimulation Location R lumbar paraspinals/ Glute   Electrical Stimulation Action Pre-Mod   Electrical Stimulation Parameters 80-150 Hz  x15 min in sitting   Electrical Stimulation Goals Pain;Tone   Ultrasound   Ultrasound Location R lumbar paraspinals/ upper Glute   Ultrasound Parameters 1.5 w/cm2, 100%,1 mhz x10 min   Ultrasound Goals Pain   Manual Therapy   Manual Therapy Myofascial release   Myofascial Release MFR/TPR to R lumbar paraspinals/ Glutes in L sidelying to decrease pain and tightness                  PT Short Term Goals - 07/05/15 1927    PT SHORT TERM GOAL #1   Title Ind with a HEP.   Time 2   Period Weeks   Status New           PT Long Term Goals - 07/05/15 1927    PT LONG TERM GOAL #1   Title Perform ADL's with pain not > 3-4/10.   Time 6   Period Weeks  Status New   PT LONG TERM GOAL #2   Title Sit 30 minutes with pain not > 3-4/10.   Time 6   Period Weeks   Status New   PT LONG TERM GOAL #3   Title Transition from sit to stand with pain not > 4/10.   Time 6   Period Weeks   Status New               Plan - 07/24/15 1353    Clinical Impression Statement Patient tolerated today's treatment well with only reports of tenderness with today's manual therapy. Patient arrived at clinic with increased R low back pain into R Glute region and reported days worth of relief following previous treatment per patient report. Notable tightness was noted throughout R lumbar paraspinals and R Upper Glute region. Patient was educated regarding why she was experiencing pain into R Glute region with low back problems and verbalized understanding of nerve pain. Patient requested for stimulation and heat to be completed in sitting due to continued sinus congestion. Normal modalities response noted following  removal of the modalities. Patient denied low back pain following today's treatment.   Pt will benefit from skilled therapeutic intervention in order to improve on the following deficits Pain;Decreased activity tolerance;Decreased range of motion   Rehab Potential Good   PT Frequency 2x / week   PT Duration 6 weeks   PT Treatment/Interventions ADLs/Self Care Home Management;Electrical Stimulation;Moist Heat;Therapeutic exercise;Therapeutic activities;Ultrasound;Patient/family education;Manual techniques   PT Next Visit Plan Modalites and STW/M to patient's affected low back region right > left.  Progress into low-level core exercises.   Consulted and Agree with Plan of Care Patient        Problem List Patient Active Problem List   Diagnosis Date Noted  . Herpes genitalis in women 07/16/2015  . Genital lesion, female 07/12/2015  . Chronic anticoagulation 05/30/2015  . Family history of coronary artery disease in sister 05/30/2015  . Acute on chronic diastolic CHF (congestive heart failure), NYHA class 1 ([place]) 05/30/2015  . Chest pain with moderate risk of acute coronary syndrome 05/29/2015  . Pain in the chest   . Dyspnea   . Essential hypertension   . RLS (restless legs syndrome) 04/27/2015  . Fall 02/26/2015  . Back pain 02/26/2015  . Acute low back pain due to trauma 02/26/2015  . Lipoma 02/08/2015  . Bipolar disorder ([place]) 01/23/2015  . Insomnia 01/23/2015  . CKD stage 3 due to type 2 diabetes mellitus ([place]) 01/05/2015  . Chronic back pain 01/04/2015  . Permanent atrial fibrillation ([place]) 01/04/2015  . Pruritus 01/04/2015  . Diabetes mellitus with stage 2 chronic kidney disease ([place])   . Hyperlipidemia     Wynelle Fanny, PTA 07/24/2015, 2:06 PM  [facility] [address] [place], [place], [address] Phone: [phone]   Fax:  [phone]  Name: Maryanne Divito MRN: [id number] Date of Birth: 12/14/1945

## 2015-07-24 NOTE — Telephone Encounter (Signed)
Pt requests you call her after 2:30 PM

## 2015-07-24 NOTE — Telephone Encounter (Signed)
Patient is not having any burning, but is having urgency.

## 2015-07-24 NOTE — Telephone Encounter (Signed)
Pt returning rhondas call will be home until 1230

## 2015-07-25 ENCOUNTER — Ambulatory Visit: Payer: Medicare Other | Admitting: Cardiology

## 2015-07-25 NOTE — Telephone Encounter (Signed)
Pt requesting a call back. 

## 2015-07-26 ENCOUNTER — Telehealth: Payer: Self-pay | Admitting: Endocrinology

## 2015-07-26 NOTE — Telephone Encounter (Signed)
Pt said that her insurance will cover the medication Willeen Niece) that Dr. Dwyane Dee told her to look into.  Her insurance just said it would require a prior auth.  She gave me the number [phone]

## 2015-07-27 ENCOUNTER — Encounter: Payer: Self-pay | Admitting: Family Medicine

## 2015-07-27 ENCOUNTER — Ambulatory Visit (INDEPENDENT_AMBULATORY_CARE_PROVIDER_SITE_OTHER): Payer: Medicare Other | Admitting: Family Medicine

## 2015-07-27 VITALS — BP 135/85 | HR 76 | Temp 97.1°F | Ht 62.0 in | Wt 198.6 lb

## 2015-07-27 DIAGNOSIS — E1122 Type 2 diabetes mellitus with diabetic chronic kidney disease: Secondary | ICD-10-CM | POA: Diagnosis not present

## 2015-07-27 DIAGNOSIS — N183 Chronic kidney disease, stage 3 (moderate): Secondary | ICD-10-CM | POA: Diagnosis not present

## 2015-07-27 MED ORDER — TIZANIDINE HCL 6 MG PO CAPS
6.0000 mg | ORAL_CAPSULE | Freq: Three times a day (TID) | ORAL | Status: DC | PRN
Start: 2015-07-27 — End: 2015-09-17

## 2015-07-27 NOTE — Patient Instructions (Signed)
Great to see you!  I have increased the zanaflex to 6 mg up to 3 times daily  Come back with any concerns

## 2015-07-27 NOTE — Progress Notes (Signed)
   HPI  Patient presents today to discuss pain and recent findings.  Renal dysfunction He had recent creatinine finding where her creatinine bumped from 1.0-1.57, this is similar to the previous bump from 1.1-1.59. She has stopped metformin. She also stopped oxycodone, believing that this was an incident. She comes in with increased pain requesting increase in tramadol and tizanidine.  She has no other complaints, breathing normally  PMH: Smoking status noted ROS: Per HPI  Objective: BP 135/85 mmHg  Pulse 76  Temp(Src) 97.1 F (36.2 C) (Oral)  Ht 5\' 2"  (1.575 m)  Wt 198 lb 9.6 oz (90.084 kg)  BMI 36.32 kg/m2 Gen: NAD, alert, cooperative with exam HEENT: NCAT CV: RRR, gjscript:void(0)ood S1/S2, no murmur Resp: CTABL, no wheezes, non-labored Ext: No edema, warm Neuro: Alert and oriented, No gross deficits  Assessment and plan:  # AKI Unclear etiology Appropriately stopped metformin OK to restart oxycodone   # Pain Restart pain meds Resist increase in tramadol although it would be safe as she is not on any other serotonergics Increased tizanidine   Laroy Apple, MD [facility] 07/27/2015, 3:13 PM

## 2015-07-30 ENCOUNTER — Ambulatory Visit (INDEPENDENT_AMBULATORY_CARE_PROVIDER_SITE_OTHER): Payer: Medicare Other | Admitting: Obstetrics and Gynecology

## 2015-07-30 ENCOUNTER — Encounter: Payer: Self-pay | Admitting: Obstetrics and Gynecology

## 2015-07-30 ENCOUNTER — Other Ambulatory Visit: Payer: Self-pay | Admitting: *Deleted

## 2015-07-30 VITALS — BP 132/80 | Ht 60.0 in | Wt 193.0 lb

## 2015-07-30 DIAGNOSIS — A609 Anogenital herpesviral infection, unspecified: Secondary | ICD-10-CM

## 2015-07-30 DIAGNOSIS — A6009 Herpesviral infection of other urogenital tract: Secondary | ICD-10-CM

## 2015-07-30 MED ORDER — EXENATIDE ER 2 MG ~~LOC~~ PEN: PEN_INJECTOR | SUBCUTANEOUS | Status: DC

## 2015-07-30 MED ORDER — INSULIN GLARGINE-LIXISENATIDE 100-33 UNT-MCG/ML ~~LOC~~ SOPN: 50.0000 [IU] | PEN_INJECTOR | Freq: Every day | SUBCUTANEOUS | Status: DC

## 2015-07-30 NOTE — Telephone Encounter (Signed)
She will start with 50 units daily on Soliqua in the morning and not take the Lantus.  May increase it 2-4 units if fasting blood sugar stays over 150

## 2015-07-30 NOTE — Telephone Encounter (Signed)
Please see below and advise.

## 2015-07-30 NOTE — Progress Notes (Signed)
Patient ID: Amber Stephenson, female   DOB: 12-02-1945, 70 y.o.   MRN: [id number] Pt here today for follow up. Pt states that she has seen improvement with medications. Pt states that she would like another exam done due to itching and one of the areas didn't go away.

## 2015-07-30 NOTE — Telephone Encounter (Signed)
Need to first see if she is benefiting from [place], she can then have this combined.  Would prefer to wait till the next visit

## 2015-07-30 NOTE — Telephone Encounter (Signed)
She has not been able to start her Victoza yet, she would like to try the [facility] before the bydureon. Please advise.

## 2015-07-30 NOTE — Progress Notes (Signed)
[facility] Visit  Patient name: Amber Stephenson MRN [id number]  Date of birth: 01-26-1946  CC & HPI:  Amber Stephenson is a 70 y.o. female presenting today for follow-up of anogenital lesions after being initially seen 2 weeks ago. 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 She notes that since taking the acyclovir 5x/day for 5 days, her symptoms have improved, but she is still itching, and one of the sores has not completely resolved.   ROS:  A complete 10 system review of systems was obtained and all systems are negative except as noted in the HPI and PMH.    Pertinent History Reviewed:   Reviewed: Significant for DM, HLD, HTN Medical         Past Medical History  Diagnosis Date  . Diabetes mellitus without complication ([place])   . Hyperlipidemia   . Hypertension   . Bipolar affective ([place])   . Atrial fibrillation ([place])   . Chronic back pain   . Pain management   . CHF (congestive heart failure) ([place])   . Renal insufficiency                               Surgical Hx:    Past Surgical History  Procedure Laterality Date  . Thigh surgery    . Shoulder surgery Right   . Breast reduction surgery     Medications: Reviewed & Updated - see associated section                       Current outpatient prescriptions:  .  apixaban (ELIQUIS) 5 MG TABS tablet, Take 5 mg by mouth 2 (two) times daily., Disp: , Rfl:  .  ARIPiprazole (ABILIFY) 15 MG tablet, Take 1 tablet (15 mg total) by mouth daily., Disp: 90 tablet, Rfl: 1 .  atorvastatin (LIPITOR) 40 MG tablet, Take 1 tablet (40 mg total) by mouth daily., Disp: 90 tablet, Rfl: 3 .  Blood Glucose Monitoring Suppl (FREESTYLE LITE) DEVI, Use to check blood sugar tid. DX E11.22, Disp: 1 each, Rfl: 0 .  Cholecalciferol (VITAMIN D3) 1000 UNITS CAPS, Take 1,000 Units by mouth daily., Disp: , Rfl:  .  fenofibrate (TRICOR) 145 MG tablet, Take 145 mg  by mouth daily., Disp: , Rfl:  .  furosemide (LASIX) 20 MG tablet, Take 20 mg by mouth 2 (two) times daily., Disp: , Rfl:  .  glucose blood (FREESTYLE LITE) test strip, Test tid. DX E11.22, Disp: 100 each, Rfl: 5 .  ibuprofen (ADVIL,MOTRIN) 800 MG tablet, TAKE ONE TABLET BY MOUTH 3 TIMES DAILY AS NEEDED FOR PAIN, Disp: , Rfl: 0 .  Insulin Glargine (LANTUS SOLOSTAR) 100 UNIT/ML Solostar Pen, Inject 60 Units into the skin at bedtime. , Disp: , Rfl:  .  Lancets (FREESTYLE) lancets, Test tid. DX E11.22, Disp: 100 each, Rfl: 5 .  lidocaine (LIDODERM) 5 %, Place 1 patch onto the skin 2 (two) times daily., Disp: 30 patch, Rfl: 5 .  lidocaine (XYLOCAINE) 2 % solution, Use as directed 20 mLs in the mouth or throat as needed for mouth pain., Disp: 100 mL, Rfl: 0 .  lisinopril (PRINIVIL,ZESTRIL) 10 MG tablet, Take 1 tablet (10 mg total) by mouth daily., Disp: 30 tablet, Rfl: 3 .  metoprolol (LOPRESSOR) 100 MG tablet, Take 1 tablet (100 mg total) by mouth 2 (  two) times daily., Disp: 60 tablet, Rfl: 5 .  metoprolol tartrate (LOPRESSOR) 25 MG tablet, Take 1 tablet (25 mg total) by mouth 2 (two) times daily., Disp: 60 tablet, Rfl: 6 .  oxyCODONE-acetaminophen (PERCOCET) 10-325 MG tablet, TAKE ONE TABLET BY MOUTH FOUR TIMES A DAY AS NEEDED FOR 30 DAYS, Disp: , Rfl: 0 .  potassium chloride (K-DUR,KLOR-CON) 10 MEQ tablet, TAKE 1 TABLET 2 TIMES DAILY WITH FOOD, Disp: , Rfl: 0 .  pregabalin (LYRICA) 50 MG capsule, 1 pill morning and afternoon, 2 pills before bed (Patient taking differently: Take 50-100 mg by mouth 2 (two) times daily. 1 pill morning and afternoon, 2 pills before bed), Disp: 360 capsule, Rfl: 1 .  SUMAtriptan (IMITREX) 100 MG tablet, Take 1 tablet (100 mg total) by mouth See admin instructions., Disp: 9 tablet, Rfl: 2 .  temazepam (RESTORIL) 15 MG capsule, Take 1 capsule (15 mg total) by mouth at bedtime as needed for sleep., Disp: 30 capsule, Rfl: 5 .  tiZANidine (ZANAFLEX) 4 MG tablet, Take 1 tablet (4  mg total) by mouth 2 (two) times daily., Disp: 60 tablet, Rfl: 2 .  tizanidine (ZANAFLEX) 6 MG capsule, Take 1 capsule (6 mg total) by mouth 3 (three) times daily as needed for muscle spasms., Disp: 90 capsule, Rfl: 2 .  traMADol (ULTRAM) 50 MG tablet, Take 1 tablet (50 mg total) by mouth every 6 (six) hours as needed. for pain, Disp: 60 tablet, Rfl: 2 .  VICTOZA 18 MG/3ML SOPN, Inject 0.2 mLs (1.2 mg total) into the skin daily. Inject once daily at the same time, Disp: 2 pen, Rfl: 3   Social History: Reviewed -  reports that she has never smoked. She has never used smokeless tobacco.  Objective Findings:  Vitals: Blood pressure 132/80, height 5' (1.524 m), weight 193 lb (87.544 kg).  Physical Examination: General appearance - alert, well appearing, and in no distress, oriented to person, place, and time and overweight Mental status - alert, oriented to person, place, and time, normal mood, behavior, speech, dress, motor activity, and thought processes, affect appropriate to mood Pelvic -  VULVA: healing, 5 mm scar in right labia majora; all other lesions are completely gone VAGINA: normal appearing vagina with normal color and discharge  Assessment & Plan:   A:  1. Healing vulvar lesion. 2. HSV culture test - negative. Results reviewed with pt.  P:  1. We will test HSV-2 antibody. Lengthy conversation with pt, then repeated with partner. I spent 25 minutes with the visit with >than 50% spent in counseling and coordination of care.    By signing my name below, I, [name], attest that this documentation has been prepared under the direction and in the presence of Jonnie Kind, MD. Electronically Signed: [name], ED Scribe. 07/30/2015. 11:37 AM.  I personally performed the services described in this documentation, which was SCRIBED in my presence. The recorded information has been reviewed and considered accurate. It has been edited as necessary during review. Jonnie Kind,  MD

## 2015-07-31 ENCOUNTER — Encounter: Payer: Self-pay | Admitting: Physical Therapy

## 2015-07-31 ENCOUNTER — Ambulatory Visit: Payer: Medicare Other | Admitting: Physical Therapy

## 2015-07-31 ENCOUNTER — Other Ambulatory Visit: Payer: Self-pay | Admitting: Family Medicine

## 2015-07-31 DIAGNOSIS — G8929 Other chronic pain: Secondary | ICD-10-CM | POA: Diagnosis not present

## 2015-07-31 DIAGNOSIS — E1142 Type 2 diabetes mellitus with diabetic polyneuropathy: Secondary | ICD-10-CM | POA: Diagnosis not present

## 2015-07-31 DIAGNOSIS — R5381 Other malaise: Secondary | ICD-10-CM | POA: Diagnosis not present

## 2015-07-31 DIAGNOSIS — M545 Low back pain, unspecified: Secondary | ICD-10-CM

## 2015-07-31 DIAGNOSIS — L84 Corns and callosities: Secondary | ICD-10-CM | POA: Diagnosis not present

## 2015-07-31 DIAGNOSIS — B351 Tinea unguium: Secondary | ICD-10-CM | POA: Diagnosis not present

## 2015-07-31 LAB — HSV 2 ANTIBODY, IGG: HSV 2 Glycoprotein G Ab, IgG: <0.91 index (ref 0.00–0.90)

## 2015-07-31 NOTE — Therapy (Signed)
[facility] [name], [place], [address] Phone: [phone]   Fax:  [phone]  Physical Therapy Treatment  Patient Details  Name: Amber Stephenson MRN: [id number] Date of Birth: Oct 07, 1945 Referring Provider: Kenn File MD  Encounter Date: 07/31/2015      PT End of Session - 07/31/15 1302    Visit Number 4   Number of Visits 12   Date for PT Re-Evaluation 08/16/15   PT Start Time 1302   PT Stop Time 1350   PT Time Calculation (min) 48 min   Activity Tolerance Patient tolerated treatment well   Behavior During Therapy [facility] for tasks assessed/performed      Past Medical History  Diagnosis Date  . Diabetes mellitus without complication (Amber Stephenson)   . Hyperlipidemia   . Hypertension   . Bipolar affective (Amber Stephenson)   . Atrial fibrillation (Amber Stephenson)   . Chronic back pain   . Pain management   . CHF (congestive heart failure) (Amber Stephenson)   . Renal insufficiency     Past Surgical History  Procedure Laterality Date  . Thigh surgery    . Shoulder surgery Right   . Breast reduction surgery      There were no vitals filed for this visit.  Visit Diagnosis:  Chronic right-sided low back pain without sciatica  Debility      Subjective Assessment - 07/31/15 1302    Subjective States that her back is "terrible" today and states that L hip is bothering her.  States that the pain made her cry and down to both knees. States that she is going out of town with her husband this weekend and thinks she may not be able to do much walking with her back.   Limitations House hold activities   Patient Stated Goals Would like to decrease my pain for a better quality of life.   Currently in Pain? Yes   Pain Score 6    Pain Location Back   Pain Orientation Lower;Right;Left   Pain Descriptors / Indicators Aching   Pain Onset More than a month ago   Pain Frequency Constant            [facility] PT Assessment - 07/31/15 0001    Assessment    Medical Diagnosis Chronic low back pain.   Precautions   Precautions None                     [facility] Adult PT Treatment/Exercise - 07/31/15 0001    Modalities   Modalities Electrical Stimulation;Moist Heat;Ultrasound   Moist Heat Therapy   Number Minutes Moist Heat 15 Minutes   Moist Heat Location Lumbar Spine   Electrical Stimulation   Electrical Stimulation Location R low back/ upper Glute   Electrical Stimulation Action IFC   Electrical Stimulation Parameters 80-150 Hz x15 min   Electrical Stimulation Goals Pain;Tone   Ultrasound   Ultrasound Location R lumbar paraspinals/Glute   Ultrasound Parameters 1.5 w/cm2, 100%,1 mhz x10 min   Ultrasound Goals Pain   Manual Therapy   Manual Therapy Myofascial release   Myofascial Release MFR/TPR to R lumbar paraspinals/ Glutes in L sidelying to decrease pain and tightness                  PT Short Term Goals - 07/05/15 1927    PT SHORT TERM GOAL #1   Title Ind with a HEP.   Time 2   Period Weeks   Status New  PT Long Term Goals - 07/05/15 1927    PT LONG TERM GOAL #1   Title Perform ADL's with pain not > 3-4/10.   Time 6   Period Weeks   Status New   PT LONG TERM GOAL #2   Title Sit 30 minutes with pain not > 3-4/10.   Time 6   Period Weeks   Status New   PT LONG TERM GOAL #3   Title Transition from sit to stand with pain not > 4/10.   Time 6   Period Weeks   Status New               Plan - 07/31/15 1341    Clinical Impression Statement Patient tolerated today's treatment well although she presented with increased pain in low back that was bad enough that she cried last night. Patient continues to present with increased tightness in R Lumbar paraspinals and Glute musculature but diminished from first episode of manual therapy. Continues to require assist in rising from sidelying to sitting. Patient again requested stimulation and moist heat to be conducted in sitting secondary to  sinus congestion. No goals have been achieved as of today secondary to increased low back pain. Normal modalities response noted following removal of the modalities.    Pt will benefit from skilled therapeutic intervention in order to improve on the following deficits Pain;Decreased activity tolerance;Decreased range of motion   Rehab Potential Good   PT Frequency 2x / week   PT Duration 6 weeks   PT Treatment/Interventions ADLs/Self Care Home Management;Electrical Stimulation;Moist Heat;Therapeutic exercise;Therapeutic activities;Ultrasound;Patient/family education;Manual techniques   PT Next Visit Plan Modalites and STW/M to patient's affected low back region right > left.  Progress into low-level core exercises.   Consulted and Agree with Plan of Care Patient        Problem List Patient Active Problem List   Diagnosis Date Noted  . Herpes genitalis in women 07/16/2015  . Genital lesion, female 07/12/2015  . Chronic anticoagulation 05/30/2015  . Family history of coronary artery disease in sister 05/30/2015  . Acute on chronic diastolic CHF (congestive heart failure), NYHA class 1 ([place]) 05/30/2015  . Chest pain with moderate risk of acute coronary syndrome 05/29/2015  . Pain in the chest   . Dyspnea   . Essential hypertension   . RLS (restless legs syndrome) 04/27/2015  . Fall 02/26/2015  . Back pain 02/26/2015  . Acute low back pain due to trauma 02/26/2015  . Lipoma 02/08/2015  . Bipolar disorder ([place]) 01/23/2015  . Insomnia 01/23/2015  . CKD stage 3 due to type 2 diabetes mellitus ([place]) 01/05/2015  . Chronic back pain 01/04/2015  . Permanent atrial fibrillation ([place]) 01/04/2015  . Pruritus 01/04/2015  . Diabetes mellitus with stage 2 chronic kidney disease ([place])   . Hyperlipidemia     Wynelle Fanny, PTA 07/31/2015, 1:56 PM  [name] [address] [place], [place], [address] Phone: [phone]   Fax:   [phone]  Name: Amber Stephenson MRN: [id number] Date of Birth: [birth year]

## 2015-07-31 NOTE — Telephone Encounter (Signed)
Last seen 07/27/15  Dr Wendi Snipes

## 2015-08-01 ENCOUNTER — Other Ambulatory Visit: Payer: Self-pay | Admitting: Endocrinology

## 2015-08-01 DIAGNOSIS — Z794 Long term (current) use of insulin: Secondary | ICD-10-CM

## 2015-08-01 DIAGNOSIS — E1165 Type 2 diabetes mellitus with hyperglycemia: Secondary | ICD-10-CM

## 2015-08-01 NOTE — Telephone Encounter (Signed)
Amber Stephenson was denied, patient must try and fail Bydureon and tanzeum first, Vaughan Basta has scheduled an appointment with her to train her on this.

## 2015-08-02 ENCOUNTER — Ambulatory Visit (HOSPITAL_COMMUNITY): Payer: Self-pay | Admitting: Psychiatry

## 2015-08-06 DIAGNOSIS — G894 Chronic pain syndrome: Secondary | ICD-10-CM | POA: Diagnosis not present

## 2015-08-06 DIAGNOSIS — Z79891 Long term (current) use of opiate analgesic: Secondary | ICD-10-CM | POA: Diagnosis not present

## 2015-08-06 DIAGNOSIS — M5136 Other intervertebral disc degeneration, lumbar region: Secondary | ICD-10-CM | POA: Diagnosis not present

## 2015-08-06 DIAGNOSIS — M47816 Spondylosis without myelopathy or radiculopathy, lumbar region: Secondary | ICD-10-CM | POA: Diagnosis not present

## 2015-08-06 DIAGNOSIS — M545 Low back pain: Secondary | ICD-10-CM | POA: Diagnosis not present

## 2015-08-06 DIAGNOSIS — G8929 Other chronic pain: Secondary | ICD-10-CM | POA: Diagnosis not present

## 2015-08-07 ENCOUNTER — Other Ambulatory Visit (INDEPENDENT_AMBULATORY_CARE_PROVIDER_SITE_OTHER): Payer: Medicare Other

## 2015-08-07 DIAGNOSIS — Z794 Long term (current) use of insulin: Secondary | ICD-10-CM | POA: Diagnosis not present

## 2015-08-07 DIAGNOSIS — E1165 Type 2 diabetes mellitus with hyperglycemia: Secondary | ICD-10-CM

## 2015-08-07 LAB — BASIC METABOLIC PANEL
BUN: 21 mg/dL (ref 6–23)
CO2: 28 mEq/L (ref 19–32)
Calcium: 9.8 mg/dL (ref 8.4–10.5)
Chloride: 101 mEq/L (ref 96–112)
Creatinine, Ser: 0.99 mg/dL (ref 0.40–1.20)
GFR: 59.25 mL/min — ABNORMAL LOW (ref >60.00)
Glucose, Bld: 379 mg/dL — ABNORMAL HIGH (ref 70–99)
Potassium: 4.5 mEq/L (ref 3.5–5.1)
Sodium: 138 mEq/L (ref 135–145)

## 2015-08-09 ENCOUNTER — Other Ambulatory Visit: Payer: Self-pay

## 2015-08-09 ENCOUNTER — Telehealth: Payer: Self-pay | Admitting: *Deleted

## 2015-08-09 DIAGNOSIS — M7062 Trochanteric bursitis, left hip: Secondary | ICD-10-CM | POA: Diagnosis not present

## 2015-08-09 DIAGNOSIS — M7061 Trochanteric bursitis, right hip: Secondary | ICD-10-CM | POA: Diagnosis not present

## 2015-08-09 NOTE — Telephone Encounter (Signed)
That is okay to take, likelihood is that we'll take her sugars up quite a bit. You may need to increase her Lantus at bedtime by 5 units while you are taking the prednisone. I do not see any recent numbers from hemoglobin A1c. If she would like to come in and get a visit then that would ultimately help determine better where her diabetes is and the patient will be able to be controlled. She is going to have to keep a close eye on it during those 2 weeks.

## 2015-08-09 NOTE — Telephone Encounter (Signed)
Pt aware of Dr. Merita Norton recommendations.

## 2015-08-11 ENCOUNTER — Other Ambulatory Visit: Payer: Self-pay | Admitting: Family Medicine

## 2015-08-11 NOTE — Telephone Encounter (Signed)
Go ahead and give her education on how to use Novolin as a sliding scale while she is currently on the prednisone. Give her 10 units Novolin now since her sugars are up over 4 or 500. Make sure she stays well hydrated when her sugars are this high. Also may increase Lantus to 70 units tonight while she is still on the prednisone Caryl Pina, MD [facility] 08/11/2015, 11:19 AM

## 2015-08-11 NOTE — Telephone Encounter (Signed)
Given 2 cortisone shots 2 days ago by orthopedic surgeon and started on a 2 week course of prednisone. She called on 3/9 to advise [place] of this and has increased her lantus to 65 units qhs per instructions. Blood sugar wouldn't register on meter this morning which means it is above 400. No recent A1C. Patient states that sugars is normally between 130 and 200 in the morning. She has an unopened bottle of Novolin and questions whether she should use this in addition to the Lantus.  Will forward to Dr Dettinger for review.

## 2015-08-11 NOTE — Telephone Encounter (Addendum)
Patient will check blood sugar before meals and at bedtime. Discussed sliding scale insulin. Stressed staying well hydrated.  She will increase her lantus to 70 units at bedtime.  Informed that there is someone available by phone even when the office is closed and that if she has any questions or problems she should call.  Patient voiced understanding and agreement to plan.

## 2015-08-13 ENCOUNTER — Encounter: Payer: Self-pay | Admitting: Endocrinology

## 2015-08-13 ENCOUNTER — Telehealth: Payer: Self-pay | Admitting: *Deleted

## 2015-08-13 ENCOUNTER — Ambulatory Visit (INDEPENDENT_AMBULATORY_CARE_PROVIDER_SITE_OTHER): Payer: Medicare Other | Admitting: Endocrinology

## 2015-08-13 VITALS — BP 128/82 | HR 85 | Temp 97.5°F | Resp 16 | Ht 60.0 in | Wt 191.2 lb

## 2015-08-13 DIAGNOSIS — E1165 Type 2 diabetes mellitus with hyperglycemia: Secondary | ICD-10-CM | POA: Diagnosis not present

## 2015-08-13 DIAGNOSIS — Z794 Long term (current) use of insulin: Secondary | ICD-10-CM

## 2015-08-13 MED ORDER — INSULIN LISPRO 100 UNIT/ML (KWIKPEN): PEN_INJECTOR | SUBCUTANEOUS | Status: DC

## 2015-08-13 NOTE — Patient Instructions (Addendum)
Lantus 40 units twice daily  Humalog 20 units before each meal  Check blood sugars on waking up 4-5  times a week Also check blood sugars about 2 hours after a meal and do this after different meals by rotation  Recommended blood sugar levels on waking up is 90-130 and about 2 hours after meal is 130-160  Please bring your blood sugar monitor to each visit, thank you

## 2015-08-13 NOTE — Telephone Encounter (Signed)
Called to check on patient and see how her blood sugars have been the past 2 days since we spoke on Saturday. Reports that it was 440 this morning and still is registering "high" before meals. Other than increase in thirst she is asymptomatic.  She has an appt with endocrinology today and will discuss this with them. She will f/u as needed.

## 2015-08-13 NOTE — Progress Notes (Signed)
Patient ID: Amber Stephenson, female   DOB: 28-Feb-1946, 70 y.o.   MRN: [id number]  .          Reason for Appointment: Follow-up for Type 2 Diabetes  Referring physician: Wendi Snipes  History of Present Illness:          Date of diagnosis of type 2 diabetes mellitus:   2007       Background history:   She was diagnosed to have diabetes when she was having weakness and fainting episodes.  She thinks she was treated with metformin for quite some time but not clear if she took anything else  She was started on Lantus 5 years ago probably because of poor control in [place] She thinks however that even with taking Lantus and metformin her blood sugars were usually high after meals  Her A1c has generally been consistently above 7 even with metformin and Lantus and prior to moving to [place] it was about 8%  Recent history:   INSULIN regimen is:   Lantus 70 units daily at bedtime, Novolin NPH 23 bid     Her last A1c was 9% in 12/16 She has not brought her monitor for download today  Current management, blood sugar patterns and problems identified:  She was recommended Victoza in addition to her Lantus insulin on her last visit but this was denied by her insurance company and she was finally started on Bydureon left instructions.  However she has taken this only one time so far  She says that she had a steroid injection in her hips and prednisone last week causing her blood sugars to be higher, over 400 at times including this morning  However on her lab work on 3/7 her glucose was 379  Again she is mostly checking blood sugars in the mornings and does not remember the exact readings  She had a previous prescription for Novolin NPH and she started taking this when her blood sugars was very high last week along with increasing her Lantus by 10 units  However blood sugars did not appear to be improving but she is now getting off her prednisone  Currently not on  metformin  Currently not able to exercise much because of back pain  Non-insulin hypoglycemic drugs the patient is taking are: Bydureon 2 mg weekly since 3/17 Side effects from medications have been: None  Compliance with the medical regimen: Fair Hypoglycemia:   none  Glucose monitoring:  done 1 times a day         Glucometer:  FreeStyle .      Blood Glucose readings as above  Self-care: The diet that the patient has been following is: tries to limit drinks which sugar .     Typical meal intake: Breakfast is Granola cereal usually.  Usually having salad at lunch, mixed meal at dinnertime.  Snacks are with meat, yogurt, cottage cheese               Dietician visit, most recent: Never               Exercise:  none  Weight history: Her maximum weight previously has been 221, she lost down to 179 with dietary changes  Wt Readings from Last 3 Encounters:  08/13/15 191 lb 3.2 oz (86.728 kg)  07/30/15 193 lb (87.544 kg)  07/27/15 198 lb 9.6 oz (90.084 kg)    Glycemic control:   Lab Results  Component Value Date   HGBA1C 9.0* 05/30/2015   HGBA1C  8.0 03/30/2015   HGBA1C 7.3 01/04/2015   Lab Results  Component Value Date   MICROALBUR 1.9 07/23/2015   LDLCALC 71 01/04/2015   CREATININE 0.99 08/07/2015    Lab on 08/07/2015  Component Date Value Ref Range Status  . Sodium 08/07/2015 138  135 - 145 mEq/L Final  . Potassium 08/07/2015 4.5  3.5 - 5.1 mEq/L Final  . Chloride 08/07/2015 101  96 - 112 mEq/L Final  . CO2 08/07/2015 28  19 - 32 mEq/L Final  . Glucose, Bld 08/07/2015 379* 70 - 99 mg/dL Final  . BUN 08/07/2015 21  6 - 23 mg/dL Final  . Creatinine, Ser 08/07/2015 0.99  0.40 - 1.20 mg/dL Final  . Calcium 08/07/2015 9.8  8.4 - 10.5 mg/dL Final  . GFR 08/07/2015 59.25* >60.00 mL/min Final       Medication List       This list is accurate as of: 08/13/15 11:59 PM.  Always use your most recent med list.               ARIPiprazole 15 MG tablet  Commonly known as:   ABILIFY  Take 1 tablet (15 mg total) by mouth daily.     atorvastatin 40 MG tablet  Commonly known as:  LIPITOR  Take 1 tablet (40 mg total) by mouth daily.     ELIQUIS 5 MG Tabs tablet  Generic drug:  apixaban  Take 5 mg by mouth 2 (two) times daily.     Exenatide ER 2 MG Pen  Commonly known as:  BYDUREON  Inject the contents of one pen once per week     fenofibrate 145 MG tablet  Commonly known as:  TRICOR  Take 145 mg by mouth daily.     freestyle lancets  Test tid. DX E11.22     FREESTYLE LITE Devi  Use to check blood sugar tid. DX E11.22     furosemide 20 MG tablet  Commonly known as:  LASIX  Take 20 mg by mouth 2 (two) times daily.     glucose blood test strip  Commonly known as:  FREESTYLE LITE  Test tid. DX E11.22     ibuprofen 800 MG tablet  Commonly known as:  ADVIL,MOTRIN  TAKE ONE TABLET BY MOUTH 3 TIMES DAILY AS NEEDED FOR PAIN     insulin lispro 100 UNIT/ML KiwkPen  Commonly known as:  HUMALOG KWIKPEN  20 units before meals     LANTUS SOLOSTAR 100 UNIT/ML Solostar Pen  Generic drug:  Insulin Glargine  Inject 60 Units into the skin at bedtime.     lidocaine 2 % solution  Commonly known as:  XYLOCAINE  Use as directed 20 mLs in the mouth or throat as needed for mouth pain.     lidocaine 5 %  Commonly known as:  LIDODERM  Place 1 patch onto the skin 2 (two) times daily.     lisinopril 10 MG tablet  Commonly known as:  ZESTRIL  Take 1 tablet (10 mg total) by mouth daily.     metoprolol 100 MG tablet  Commonly known as:  LOPRESSOR  Take 1 tablet (100 mg total) by mouth 2 (two) times daily.     metoprolol tartrate 25 MG tablet  Commonly known as:  LOPRESSOR  Take 1 tablet (25 mg total) by mouth 2 (two) times daily.     oxyCODONE-acetaminophen 10-325 MG tablet  Commonly known as:  PERCOCET  TAKE ONE TABLET BY MOUTH FOUR TIMES A  DAY AS NEEDED FOR 30 DAYS     potassium chloride 10 MEQ tablet  Commonly known as:  K-DUR,KLOR-CON  TAKE 1  TABLET 2 TIMES DAILY WITH FOOD     pregabalin 50 MG capsule  Commonly known as:  LYRICA  1 pill morning and afternoon, 2 pills before bed     rizatriptan 10 MG disintegrating tablet  Commonly known as:  MAXALT-MLT  DISSOLVE 1 TABLET UNDER TONGUE IMMEDIATELY, MAY REPEAT DOSE IN 1 HOUR IF NEEDED- MAX OF 3 TABLETS IN 24 HOURS     SUMAtriptan 100 MG tablet  Commonly known as:  IMITREX  Take 1 tablet (100 mg total) by mouth See admin instructions.     temazepam 15 MG capsule  Commonly known as:  RESTORIL  Take 1 capsule (15 mg total) by mouth at bedtime as needed for sleep.     tiZANidine 4 MG tablet  Commonly known as:  ZANAFLEX  Take 1 tablet (4 mg total) by mouth 2 (two) times daily.     tizanidine 6 MG capsule  Commonly known as:  ZANAFLEX  Take 1 capsule (6 mg total) by mouth 3 (three) times daily as needed for muscle spasms.     traMADol 50 MG tablet  Commonly known as:  ULTRAM  Take 1 tablet (50 mg total) by mouth every 6 (six) hours as needed. for pain     Vitamin D3 1000 units Caps  Take 1,000 Units by mouth daily.        Allergies:  Allergies  Allergen Reactions  . Ivp Dye [Iodinated Diagnostic Agents] Swelling    Throat closes    Past Medical History  Diagnosis Date  . Diabetes mellitus without complication ([place])   . Hyperlipidemia   . Hypertension   . Bipolar affective ([place])   . Atrial fibrillation ([place])   . Chronic back pain   . Pain management   . CHF (congestive heart failure) ([place])   . Renal insufficiency     Past Surgical History  Procedure Laterality Date  . Thigh surgery    . Shoulder surgery Right   . Breast reduction surgery      Family History  Problem Relation Age of Onset  . Diabetes Mother   . Heart disease Mother   . Heart disease Brother   . Heart disease Sister     CABG  . Mental illness Brother   . Diabetes Brother     Social History:  reports that she has never smoked. She has never used smokeless tobacco. She reports  that she does not drink alcohol or use illicit drugs.    Review of Systems    Lipid history: Has been on fenofibrate and Lipitor for quite some time    Lab Results  Component Value Date   CHOL 163 01/04/2015   HDL 42 01/04/2015   LDLCALC 71 01/04/2015   TRIG 249* 01/04/2015   CHOLHDL 3.9 01/04/2015           Hypertension: Treated with lisinopril and metoprolol.  She is also on high dose metoprolol for her atrial fibrillation  Most recent eye exam was in [place] in 2016  Most recent foot exam:2/17  Review of Systems    LABS:  Lab on 08/07/2015  Component Date Value Ref Range Status  . Sodium 08/07/2015 138  135 - 145 mEq/L Final  . Potassium 08/07/2015 4.5  3.5 - 5.1 mEq/L Final  . Chloride 08/07/2015 101  96 - 112 mEq/L Final  .  CO2 08/07/2015 28  19 - 32 mEq/L Final  . Glucose, Bld 08/07/2015 379* 70 - 99 mg/dL Final  . BUN 08/07/2015 21  6 - 23 mg/dL Final  . Creatinine, Ser 08/07/2015 0.99  0.40 - 1.20 mg/dL Final  . Calcium 08/07/2015 9.8  8.4 - 10.5 mg/dL Final  . GFR 08/07/2015 59.25* >60.00 mL/min Final    Physical Examination:  BP 128/82 mmHg  Pulse 85  Temp(Src) 97.5 F (36.4 C)  Resp 16  Ht 5' (1.524 m)  Wt 191 lb 3.2 oz (86.728 kg)  BMI 37.34 kg/m2  SpO2 97%     ASSESSMENT:  Diabetes type 2, uncontrolled with obesity and BMI 37 See history of present illness for detailed discussion of current diabetes management, blood sugar patterns and problems identified  She recently has had much more significant hyperglycemia despite her taking 70 units of Lantus, blood sugars are worse this week with getting steroids Although she has just started doing Bydureon this is unlikely to be helpful in the short-term to reduce her hyperglycemia.  Discussed possible side effects and slow benefit from starting this   PLAN:    She will start taking Humalog 20 units before each meal, discussed postprandial targets and timing of the insulin  She will check  blood sugars 3-4 times a day including some readings 2 hours after meals and bring her monitor for download  Discussed blood sugar targets at various times  For now she will split the Lantus to twice a day as she may not be getting a 24-hour effect of the nighttime dose, may also consider switching to [place] if covered  Continue Bydureon weekly  Increase exercise as tolerated  Consider adding metformin on the next visit  Patient Instructions  Lantus 40 units twice daily  Humalog 20 units before each meal  Check blood sugars on waking up 4-5  times a week Also check blood sugars about 2 hours after a meal and do this after different meals by rotation  Recommended blood sugar levels on waking up is 90-130 and about 2 hours after meal is 130-160  Please bring your blood sugar monitor to each visit, thank you    Counseling time on subjects discussed above is over 50% of today's 25 minute visit  Sidharth Leverette 08/14/2015, 11:09 AM   Note: This office note was prepared with Estate agent. Any transcriptional errors that result from this process are unintentional.

## 2015-08-15 ENCOUNTER — Other Ambulatory Visit: Payer: Self-pay | Admitting: *Deleted

## 2015-08-15 ENCOUNTER — Other Ambulatory Visit: Payer: Self-pay

## 2015-08-15 MED ORDER — EXENATIDE ER 2 MG ~~LOC~~ PEN: PEN_INJECTOR | SUBCUTANEOUS | Status: DC

## 2015-08-15 MED ORDER — INSULIN LISPRO 100 UNIT/ML (KWIKPEN): PEN_INJECTOR | SUBCUTANEOUS | Status: DC

## 2015-08-15 MED ORDER — METOPROLOL TARTRATE 25 MG PO TABS: 25.0000 mg | ORAL_TABLET | Freq: Two times a day (BID) | ORAL | Status: DC

## 2015-08-16 ENCOUNTER — Other Ambulatory Visit: Payer: Self-pay | Admitting: *Deleted

## 2015-08-16 MED ORDER — LISINOPRIL 10 MG PO TABS: 10.0000 mg | ORAL_TABLET | Freq: Every day | ORAL | Status: DC

## 2015-08-16 MED ORDER — ATORVASTATIN CALCIUM 40 MG PO TABS: 40.0000 mg | ORAL_TABLET | Freq: Every day | ORAL | Status: DC

## 2015-08-16 MED ORDER — FENOFIBRATE 145 MG PO TABS: 145.0000 mg | ORAL_TABLET | Freq: Every day | ORAL | Status: DC

## 2015-08-16 MED ORDER — INSULIN PEN NEEDLE 32G X 4 MM MISC
Status: DC
Start: 2015-08-16 — End: 2019-02-01

## 2015-08-17 ENCOUNTER — Ambulatory Visit (INDEPENDENT_AMBULATORY_CARE_PROVIDER_SITE_OTHER): Payer: Medicare Other | Admitting: Psychiatry

## 2015-08-17 VITALS — BP 104/64 | HR 95 | Ht 62.0 in | Wt 192.0 lb

## 2015-08-17 DIAGNOSIS — F3110 Bipolar disorder, current episode manic without psychotic features, unspecified: Secondary | ICD-10-CM

## 2015-08-17 MED ORDER — ARIPIPRAZOLE 15 MG PO TABS: 15.0000 mg | ORAL_TABLET | Freq: Every day | ORAL | Status: DC

## 2015-08-17 MED ORDER — ZALEPLON 10 MG PO CAPS: ORAL_CAPSULE | ORAL | Status: DC

## 2015-08-17 NOTE — Progress Notes (Signed)
[facility] MD Progress Note  08/17/2015 11:34 AM Amber Stephenson  MRN:  [id number] Subjective:  Doing well Principal Problem: Bipolar disorder most recent episode manic Diagnosis:  Bipolar disorder most recent episode manic Today the patient is seen with her husband. Patient is doing well. He really like [place]. They've been here 6 months. She's taking her medications without any problems. We reviewed that she's had almost 10 psychiatric hospitalizations. They all occurred according to her and her husband when she was in states of intense irritability. She is never been euphoric. Her irritability would lean almost of violent behavior. She become profoundly aggressive. The last one was about 10 years ago when she was very agitated. During these episodes she be hyperactive and she be very loud. She did have racing thinking. She was very distractible and she had no need to sleep. The patient unable describe a clear episode of major depression. Technically therefore she is bipolar disorder manic type. We reviewed her medications once again she takes Abilify. She takes 15 mg and she says she's done very well since being on it now for years. She shows no evidence of tardive dyskinesia. Today she had an aims scale that was negative except for some minimal movement in her lower lip. It is barely detectable. The patient reports that she has been on Risperdal before and it produced side effects. Seroquel oversedated her. She does remember any other medications per se. She was on lithium one time when she was very young but does not remember its effects. The patient is never been on Lamictal or Tegretol. At this time the patient denies daily depression. She denies irritability. She's not suicidal. She denies the use of alcohol or drugs. Unfortunately there was some confusion about her sleeping aid. He started her on Restoril which did not work. She called our office but unfortunately we could not connect. Her primary care  doctor instead gave her [employer] D[employer] which she's been on in the past. It is working very well. I've asked her to allow me to be the prescriber of her Sonata. Overall the patient is very pleased. She is very stable. She lives in [place]. She has an appointment with a therapist Vickii Chafe is from our system. I think this should be just performing a relationship but it doesn't probably have to have ongoing psychotherapy. The patient does not want to open up the past. Given her intense past psychiatric history and [place] is a good idea to have her connected with a therapist. Patient Active Problem List   Diagnosis Date Noted  . Herpes genitalis in women [A60.9] 07/16/2015  . Genital lesion, female [N94.9] 07/12/2015  . Chronic anticoagulation [Z79.01] 05/30/2015  . Family history of coronary artery disease in sister [Z82.49] 05/30/2015  . Acute on chronic diastolic CHF (congestive heart failure), NYHA class 1 ([place]) [I50.33] 05/30/2015  . Chest pain with moderate risk of acute coronary syndrome [R07.9] 05/29/2015  . Pain in the chest [R07.9]   . Dyspnea [R06.00]   . Essential hypertension [I10]   . RLS (restless legs syndrome) [G25.81] 04/27/2015  . Fall [W19.XXXA] 02/26/2015  . Back pain [M54.9] 02/26/2015  . Acute low back pain due to trauma [M54.5] 02/26/2015  . Lipoma [D17.9] 02/08/2015  . Bipolar disorder ([place]) [F31.9] 01/23/2015  . Insomnia [G47.00] 01/23/2015  . CKD stage 3 due to type 2 diabetes mellitus ([place]) [id number], N18.3] 01/05/2015  . Chronic back pain [M54.9, G89.29] 01/04/2015  . Permanent atrial fibrillation (  [place]) [I48.2] 01/04/2015  . Pruritus [L29.9] 01/04/2015  . Diabetes mellitus with stage 2 chronic kidney disease (HCC) [E11.22, N18.2]   . Hyperlipidemia [E78.5]    Total Time spent with patient: 30 minutes  Past Psychiatric History:   Past Medical History:  Past Medical History  Diagnosis Date  . Diabetes mellitus without complication  ([place])   . Hyperlipidemia   . Hypertension   . Bipolar affective ([place])   . Atrial fibrillation ([place])   . Chronic back pain   . Pain management   . CHF (congestive heart failure) ([place])   . Renal insufficiency     Past Surgical History  Procedure Laterality Date  . Thigh surgery    . Shoulder surgery Right   . Breast reduction surgery     Family History:  Family History  Problem Relation Age of Onset  . Diabetes Mother   . Heart disease Mother   . Heart disease Brother   . Heart disease Sister     CABG  . Mental illness Brother   . Diabetes Brother    Family Psychiatric  History:  Social History:  History  Alcohol Use No     History  Drug Use No    Social History   Social History  . Marital Status: Married    Spouse Name: N/A  . Number of Children: 3  . Years of Education: N/A   Social History Main Topics  . Smoking status: Never Smoker   . Smokeless tobacco: Never Used  . Alcohol Use: No  . Drug Use: No  . Sexual Activity: Yes    Birth Control/ Protection: Post-menopausal   Other Topics Concern  . Not on file   Social History Narrative   Lives at home with husband.    Additional Social History:                         Sleep: Good  Appetite:  Good  Current Medications: Current Outpatient Prescriptions  Medication Sig Dispense Refill  . apixaban (ELIQUIS) 5 MG TABS tablet Take 5 mg by mouth 2 (two) times daily.    . ARIPiprazole (ABILIFY) 15 MG tablet Take 1 tablet (15 mg total) by mouth daily. 90 tablet 2  . atorvastatin (LIPITOR) 40 MG tablet Take 1 tablet (40 mg total) by mouth daily. 90 tablet 1  . Blood Glucose Monitoring Suppl (FREESTYLE LITE) DEVI Use to check blood sugar tid. DX E11.22 1 each 0  . Cholecalciferol (VITAMIN D3) 1000 UNITS CAPS Take 1,000 Units by mouth daily.    . Exenatide ER (BYDUREON) 2 MG PEN Inject the contents of one pen once per week 12 each 1  . fenofibrate (TRICOR) 145 MG tablet Take 1 tablet (145 mg total)  by mouth daily. 90 tablet 1  . furosemide (LASIX) 20 MG tablet Take 20 mg by mouth 2 (two) times daily.    Marland Kitchen glucose blood (FREESTYLE LITE) test strip Test tid. DX E11.22 100 each 5  . ibuprofen (ADVIL,MOTRIN) 800 MG tablet TAKE ONE TABLET BY MOUTH 3 TIMES DAILY AS NEEDED FOR PAIN  0  . Insulin Glargine (LANTUS SOLOSTAR) 100 UNIT/ML Solostar Pen Inject 60 Units into the skin at bedtime.     . insulin lispro (HUMALOG KWIKPEN) 100 UNIT/ML KiwkPen 20 units before meals 30 mL 1  . Insulin Pen Needle (SURE COMFORT PEN NEEDLES) 32G X 4 MM MISC Use with insulin pens as directed. Dx E11.9 300 each 11  .  Lancets (FREESTYLE) lancets Test tid. DX E11.22 100 each 5  . lidocaine (LIDODERM) 5 % Place 1 patch onto the skin 2 (two) times daily. 30 patch 5  . lidocaine (XYLOCAINE) 2 % solution Use as directed 20 mLs in the mouth or throat as needed for mouth pain. 100 mL 0  . lisinopril (PRINIVIL,ZESTRIL) 10 MG tablet Take 1 tablet (10 mg total) by mouth daily. 90 tablet 1  . metoprolol (LOPRESSOR) 100 MG tablet Take 1 tablet (100 mg total) by mouth 2 (two) times daily. 60 tablet 5  . metoprolol tartrate (LOPRESSOR) 25 MG tablet Take 1 tablet (25 mg total) by mouth 2 (two) times daily. 180 tablet 6  . oxyCODONE-acetaminophen (PERCOCET) 10-325 MG tablet TAKE ONE TABLET BY MOUTH FOUR TIMES A DAY AS NEEDED FOR 30 DAYS  0  . potassium chloride (K-DUR,KLOR-CON) 10 MEQ tablet TAKE 1 TABLET 2 TIMES DAILY WITH FOOD  0  . pregabalin (LYRICA) 50 MG capsule 1 pill morning and afternoon, 2 pills before bed (Patient taking differently: Take 50-100 mg by mouth 2 (two) times daily. 1 pill morning and afternoon, 2 pills before bed) 360 capsule 1  . rizatriptan (MAXALT-MLT) 10 MG disintegrating tablet DISSOLVE 1 TABLET UNDER TONGUE IMMEDIATELY, MAY REPEAT DOSE IN 1 HOUR IF NEEDED- MAX OF 3 TABLETS IN 24 HOURS 10 tablet 2  . SUMAtriptan (IMITREX) 100 MG tablet Take 1 tablet (100 mg total) by mouth See admin instructions. 9 tablet 2   . temazepam (RESTORIL) 15 MG capsule Take 1 capsule (15 mg total) by mouth at bedtime as needed for sleep. 30 capsule 5  . tiZANidine (ZANAFLEX) 4 MG tablet Take 1 tablet (4 mg total) by mouth 2 (two) times daily. 60 tablet 2  . tizanidine (ZANAFLEX) 6 MG capsule Take 1 capsule (6 mg total) by mouth 3 (three) times daily as needed for muscle spasms. 90 capsule 2  . traMADol (ULTRAM) 50 MG tablet Take 1 tablet (50 mg total) by mouth every 6 (six) hours as needed. for pain 60 tablet 2  . zaleplon (SONATA) 10 MG capsule 2  qhs 60 capsule 4   No current facility-administered medications for this visit.    Lab Results: No results found for this or any previous visit (from the past 48 hour(s)).  Blood Alcohol level:  No results found for: [facility]  Physical Findings: AIMS:  , ,  ,  ,    CIWA:    COWS:     Musculoskeletal: Strength & Muscle Tone: within normal limits Gait & Station: normal Patient leans: N/A  Psychiatric Specialty Exam: ROS  Blood pressure 104/64, pulse 95, height 5\' 2"  (1.575 m), weight 192 lb (87.091 kg).Body mass index is 35.11 kg/(m^2).  General Appearance: Casual  Eye Contact::  Good  Speech:  Clear and Coherent  Volume:  Normal  Mood:  Euthymic  Affect:  Appropriate  Thought Process:  Coherent  Orientation:  Full (Time, Place, and Person)  Thought Content:  WDL  Suicidal Thoughts:  No  Homicidal Thoughts:  No  Memory:  NA  Judgement:  Good  Insight:  Good  Psychomotor Activity:  Normal  Concentration:  Good  Recall:  Good  Fund of Knowledge:Good  Language: Good  Akathisia:  No  Handed:  Right  AIMS (if indicated):     Assets: Good spirits   ADL's:  Intact  Cognition: WNL  Sleep:      Treatment Plan Summary: At this time the patient will continue taking Abilify  15 mg once a day. Today we'll prescribe her Sonata 10 mg at night and may repeat. The patient will keep her appointment with the therapist which she's having a hard time connecting with. The  patient will return to see me in 3 months. She's actually doing very well. The patient will sign a release to get her last blood work from Dr. Dwyane Dee her endocrinologist. She's being treated for diabetes. Patient denies any neurological symptoms. She denies any chest pain or shortness of breath at this time. Today we spent more than 50% of the time discussing her medications at her disease state. She's doing well.   Haskel Schroeder, MD 08/17/2015, 11:34 AM

## 2015-08-27 ENCOUNTER — Ambulatory Visit: Payer: Medicare Other | Admitting: Cardiovascular Disease

## 2015-08-28 ENCOUNTER — Encounter: Payer: Self-pay | Admitting: Physical Therapy

## 2015-08-28 ENCOUNTER — Ambulatory Visit: Payer: Medicare Other | Attending: Family Medicine | Admitting: Physical Therapy

## 2015-08-28 DIAGNOSIS — G8929 Other chronic pain: Secondary | ICD-10-CM

## 2015-08-28 DIAGNOSIS — M545 Low back pain: Secondary | ICD-10-CM | POA: Insufficient documentation

## 2015-08-28 DIAGNOSIS — R5381 Other malaise: Secondary | ICD-10-CM | POA: Diagnosis not present

## 2015-08-28 NOTE — Patient Instructions (Signed)
Piriformis (Supine)    Cross legs, right on top. Gently pull other knee toward chest until stretch is felt in buttock/hip of top leg. Hold __30__ seconds. Repeat __3__ times per set. Do _1___ sets per session. Do _2-3___ sessions per day.  http://orth.exer.us/676   Copyright  VHI. All rights reserved.  Knee-to-Chest Stretch: Unilateral    With hand behind right knee, pull knee in to chest until a comfortable stretch is felt in lower back and buttocks. Keep back relaxed. Hold _30___ seconds. Repeat _3___ times per set. Do ___1_ sets per session. Do __2-3__ sessions per day.  http://orth.exer.us/126   Copyright  VHI. All rights reserved.

## 2015-08-28 NOTE — Therapy (Signed)
[facility] [place], [place], [address] Phone: [phone]   Fax:  [phone]  Physical Therapy Treatment  Patient Details  Name: Amber Stephenson MRN: [id number] Date of Birth: 1946-04-29 Referring Provider: Kenn File MD  Encounter Date: 08/28/2015      PT End of Session - 08/28/15 1302    Visit Number 5   Number of Visits 12   Date for PT Re-Evaluation 08/16/15   PT Start Time 1302   PT Stop Time 1348   PT Time Calculation (min) 46 min   Activity Tolerance Patient tolerated treatment well   Behavior During Therapy [facility] for tasks assessed/performed      Past Medical History  Diagnosis Date  . Diabetes mellitus without complication ([place])   . Hyperlipidemia   . Hypertension   . Bipolar affective ([place])   . Atrial fibrillation ([place])   . Chronic back pain   . Pain management   . CHF (congestive heart failure) ([place])   . Renal insufficiency     Past Surgical History  Procedure Laterality Date  . Thigh surgery    . Shoulder surgery Right   . Breast reduction surgery      There were no vitals filed for this visit.  Visit Diagnosis:  Chronic right-sided low back pain without sciatica  Debility      Subjective Assessment - 08/28/15 1301    Subjective States that low back hasn't been bad today or yesterday but has been bad previously. States that long ride aggravated her back on her trip to [place]. States that previously she has been having sharp pain in one location and tightness. States that heating pad has really helped. Patient states that following her trip to [place] she forgot she could come to PT.   Limitations House hold activities   Patient Stated Goals Would like to decrease my pain for a better quality of life.   Currently in Pain? Yes   Pain Score 4    Pain Location Back   Pain Orientation Right;Left;Lower   Pain Descriptors / Indicators Aching   Pain Onset More than a month ago             [facility] PT Assessment - 08/28/15 0001    Assessment   Medical Diagnosis Chronic low back pain.   Precautions   Precautions None                     [facility] Adult PT Treatment/Exercise - 08/28/15 0001    Modalities   Modalities Electrical Stimulation;Moist Heat;Ultrasound   Moist Heat Therapy   Number Minutes Moist Heat 15 Minutes   Moist Heat Location Lumbar Spine   Electrical Stimulation   Electrical Stimulation Location R low back/ Upper Glute   Electrical Stimulation Action IFC   Electrical Stimulation Parameters 80-150 Hz x15 min   Electrical Stimulation Goals Pain;Tone   Ultrasound   Ultrasound Location R lumbar paraspinals/ GLute   Ultrasound Parameters 1.5 w/cm2, 100%,1 mhz x10 min   Ultrasound Goals Pain   Manual Therapy   Manual Therapy Myofascial release   Myofascial Release MFR/TPR to R lumbar paraspinals/ Glutes in L sidelying to decrease pain and tightness                PT Education - 08/28/15 1340    Education provided Yes   Education Details HEP- SKTC, Piriformis stretch   Person(s) Educated Patient   Methods Explanation;Verbal cues;Handout   Comprehension Verbalized understanding;Verbal  cues required          PT Short Term Goals - 07/05/15 1927    PT SHORT TERM GOAL #1   Title Ind with a HEP.   Time 2   Period Weeks   Status New           PT Long Term Goals - 07/05/15 1927    PT LONG TERM GOAL #1   Title Perform ADL's with pain not > 3-4/10.   Time 6   Period Weeks   Status New   PT LONG TERM GOAL #2   Title Sit 30 minutes with pain not > 3-4/10.   Time 6   Period Weeks   Status New   PT LONG TERM GOAL #3   Title Transition from sit to stand with pain not > 4/10.   Time 6   Period Weeks   Status New               Plan - 08/28/15 1341    Clinical Impression Statement Patient tolerated today's treatment well although she reports previous increased low back pain since previous treatment.  Patient had been absent from PT following trip to [place] and forgetting she could come to PT after her return. Increased R lumbar paraspinals, Glute and Piriformis tightness today upon palpation and patient was able to tolerate moderate pressure during manual therapy. Normal modalities response noted following removal of the modalites. Electrical stimulation and moist heat completed in siting again today per patient request. Accepted new low back and glute stretching HEP without questions. Paitent noted that she has been given those stretches before and does them when the pain increases. Patient was educated to complete stretches even when pain is low as to prevent tightness and pain from reoccuring. Patient experienced low back feeling "good" following today's treatment.   Pt will benefit from skilled therapeutic intervention in order to improve on the following deficits Pain;Decreased activity tolerance;Decreased range of motion   Rehab Potential Good   PT Frequency 2x / week   PT Duration 6 weeks   PT Treatment/Interventions ADLs/Self Care Home Management;Electrical Stimulation;Moist Heat;Therapeutic exercise;Therapeutic activities;Ultrasound;Patient/family education;Manual techniques   PT Next Visit Plan Modalites and STW/M to patient's affected low back region right > left.  Progress into low-level core exercises.   PT Home Exercise Plan HEP- SKTC, Figure 4 Piriformis stretch   Consulted and Agree with Plan of Care Patient        Problem List Patient Active Problem List   Diagnosis Date Noted  . Herpes genitalis in women 07/16/2015  . Genital lesion, female 07/12/2015  . Chronic anticoagulation 05/30/2015  . Family history of coronary artery disease in sister 05/30/2015  . Acute on chronic diastolic CHF (congestive heart failure), NYHA class 1 ([place]) 05/30/2015  . Chest pain with moderate risk of acute coronary syndrome 05/29/2015  . Pain in the chest   . Dyspnea   . Essential  hypertension   . RLS (restless legs syndrome) 04/27/2015  . Fall 02/26/2015  . Back pain 02/26/2015  . Acute low back pain due to trauma 02/26/2015  . Lipoma 02/08/2015  . Bipolar disorder ([place]) 01/23/2015  . Insomnia 01/23/2015  . CKD stage 3 due to type 2 diabetes mellitus ([place]) 01/05/2015  . Chronic back pain 01/04/2015  . Permanent atrial fibrillation ([place]) 01/04/2015  . Pruritus 01/04/2015  . Diabetes mellitus with stage 2 chronic kidney disease ([place])   . Hyperlipidemia     Wynelle Fanny, PTA  08/28/2015, 1:57 PM  [facility] [place], [place], [address] Phone: [phone]   Fax:  [phone]  Name: Amber Stephenson MRN: [id number] Date of Birth: 07/08/1945

## 2015-08-30 ENCOUNTER — Encounter: Payer: Medicare Other | Attending: Endocrinology | Admitting: Dietician

## 2015-08-30 ENCOUNTER — Encounter: Payer: Self-pay | Admitting: Physical Therapy

## 2015-08-30 ENCOUNTER — Encounter: Payer: Self-pay | Admitting: Dietician

## 2015-08-30 ENCOUNTER — Other Ambulatory Visit: Payer: Self-pay

## 2015-08-30 VITALS — Ht 62.0 in | Wt 193.0 lb

## 2015-08-30 DIAGNOSIS — Z794 Long term (current) use of insulin: Secondary | ICD-10-CM | POA: Insufficient documentation

## 2015-08-30 DIAGNOSIS — E1165 Type 2 diabetes mellitus with hyperglycemia: Secondary | ICD-10-CM | POA: Insufficient documentation

## 2015-08-30 DIAGNOSIS — E118 Type 2 diabetes mellitus with unspecified complications: Secondary | ICD-10-CM

## 2015-08-30 DIAGNOSIS — IMO0002 Reserved for concepts with insufficient information to code with codable children: Secondary | ICD-10-CM

## 2015-08-30 MED ORDER — TRAMADOL HCL 50 MG PO TABS: 50.0000 mg | ORAL_TABLET | Freq: Four times a day (QID) | ORAL | Status: DC | PRN

## 2015-08-30 NOTE — Telephone Encounter (Signed)
Last seen 07/27/15  Dr Wendi Snipes  This is for mail order  If approved print

## 2015-08-30 NOTE — Patient Instructions (Signed)
Plan:  Aim for 2-3 Carb Choices per meal (30-45 grams) +/- 1 either way  Aim for 0-1 Carbs per snack if hungry  Watch your refined sugar intake. Pay attention to how you feel when you are eating and stop when you are full. Include protein in moderation with your meals and snacks  Consider reading food labels for Total Carbohydrate and Fat Grams of foods Keep up the physical therapy exercises.  Ask her for routines or exercises you can do at home or the gym. Build exercise/stretching into your life. Consider checking BG at alternate times per day as directed by MD  Consider taking medication as directed by MD

## 2015-08-30 NOTE — Telephone Encounter (Signed)
Patient aware that rx is ready to be picked up.  

## 2015-08-30 NOTE — Progress Notes (Signed)
Medical Nutrition Therapy:  Appt start time: 1300 end time:  1430.   Assessment:  Primary concerns today: Patient is here today with her husband due to the desire to better control her blood sugar and lose weight.  She has had diabetes for about the last 12 years.  Other hx includes HTN, HLD, CHF, bipolar and CKD stage 3.  HgbA1C was 8% in July, 9% in December 2016 and Fructosamine 343 07/2015.    Highest weight 221 lbs Today's weight 193 lbs.   Lowest weight 110 lbs about 70 yo.  States that she started Abilify when it was first was released.  This caused weight gain but benefits to her mood outweighed the risk.   Reports weight loss since moving here from [place] in June.  They have not stocked the pantry much and today only had graham crackers and peanut butter and cottage cheese as they are going shopping after this appointment.  In the past she has reduced to 179 lbs with diet change.  She is unable to exercise much due to back problems.    Patient lives with her husband.  They moved from [place] last summer.  Both shop and cook.  Patient is retired.    She complained of feeling dizzy during the appointment.  She was given water. CBG 170. BP 134/80.  Preferred Learning Style:   No preference indicated   Learning Readiness:   Ready  MEDICATIONS: see list to include 40 units of lantus in the am and 40 units in the pm and 20 units of Humalog before each meal (forgets this at times), Bydureon once per week.   DIETARY INTAKE:  Usual eating pattern includes 2 meals and 2-3 snacks per day.  24-hr recall:  B (6-8AM): Peanut butter and graham crackers this am but usual eggs, 2 strips bacon, [employer] toast or plain instant oatmeal with butter and 2 T brown sugar and occasional skim milk Snk ( AM): none  L ( PM): cottage cheese and fruit or tomatoes but usually skips  "not hungry" Snk ( PM): peanut butter and graham crackers D ( PM): meat, potato, vegetable, (often crock pot), often  dessert (cake, pie, cookies, ice cream) OR out to eat Snk ( PM): none or noosa yogurt Beverages: water, occasional juice  Usual physical activity: ADL's, now seeing a Physical Therapist for her back  Estimated energy needs: 1400 calories 158 g carbohydrates 88 g protein 47 g fat  Progress Towards Goal(s):  In progress.   Nutritional Diagnosis:  NB-1.1 Food and nutrition-related knowledge deficit As related to balance of carbohydrate, protein, and fat.  As evidenced by diet hx and patient report.    Intervention:  Nutrition counseling and diabetes education initiated. Discussed Carb Counting by food group as method of portion control, reading food labels, and benefits of increased activity. Also discussed basic physiology of Diabetes, target BG ranges pre and post meals, and A1c.   Plan:  Aim for 2-3 Carb Choices per meal (30-45 grams) +/- 1 either way  Aim for 0-1 Carbs per snack if hungry  Watch your refined sugar intake. Pay attention to how you feel when you are eating and stop when you are full. Include protein in moderation with your meals and snacks  Consider reading food labels for Total Carbohydrate and Fat Grams of foods Keep up the physical therapy exercises.  Ask her for routines or exercises you can do at home or the gym. Build exercise/stretching into your life. Consider checking BG  at alternate times per day as directed by MD  Consider taking medication as directed by MD  Teaching Method Utilized:  Visual Auditory Hands on  Handouts given during visit include:  Meal plan card  My plate  Living with Diabetes  Barriers to learning/adherence to lifestyle change: none  Demonstrated degree of understanding via:  Teach Back   Monitoring/Evaluation:  Dietary intake, exercise, and body weight prn

## 2015-08-31 ENCOUNTER — Encounter: Payer: Self-pay | Admitting: Endocrinology

## 2015-08-31 ENCOUNTER — Ambulatory Visit (INDEPENDENT_AMBULATORY_CARE_PROVIDER_SITE_OTHER): Payer: Medicare Other | Admitting: Endocrinology

## 2015-08-31 VITALS — BP 124/82 | HR 84 | Temp 98.5°F | Resp 14 | Ht 62.0 in | Wt 194.8 lb

## 2015-08-31 DIAGNOSIS — Z794 Long term (current) use of insulin: Secondary | ICD-10-CM | POA: Diagnosis not present

## 2015-08-31 DIAGNOSIS — E1165 Type 2 diabetes mellitus with hyperglycemia: Secondary | ICD-10-CM | POA: Diagnosis not present

## 2015-08-31 NOTE — Progress Notes (Signed)
Patient ID: Amber Stephenson, female   DOB: [birth year], 70 y.o.   MRN: [id number]  .          Reason for Appointment: Follow-up for Type 2 Diabetes  Referring physician: Wendi Snipes  History of Present Illness:          Date of diagnosis of type 2 diabetes mellitus:   2007       Background history:   She was diagnosed to have diabetes when she was having weakness and fainting episodes.  She thinks she was treated with metformin for quite some time but not clear if she took anything else  She was started on Lantus 5 years ago probably because of poor control in [place] She thinks however that even with taking Lantus and metformin her blood sugars were usually high after meals  Her A1c has generally been consistently above 7 even with metformin and Lantus and prior to moving to [place] it was about 8%  Recent history:   INSULIN regimen is:   Lantus 40-bid units daily, Humalog 20 units before meals 3 times a day Non-insulin hypoglycemic drugs the patient is taking are: Bydureon 2 mg weekly since 3/17  Her last A1c was 9% in 12/16  Current management, blood sugar patterns and problems identified:  She has been on Bydureon and is taken 3 injections so far  Because of her inconsistent control she was told to change her Lantus to twice a day  Her glucose was very high after getting steroids earlier this month but she did not take any extra insulin for this, she was not aware of doing this  She is checking her blood sugars mostly FASTING and her meter has the wrong time programmed.  Fasting readings are still mostly high although today was the best at 150  She is not taking any steroids at this time  No recent labs available to assess her control objectively  Currently not on metformin  Currently not able to exercise much because of back pain  Side effects from medications have been: None  Compliance with the medical regimen: Fair Hypoglycemia:   none  Glucose  monitoring:  done 1 times a day         Glucometer:  FreeStyle .      Blood Glucose readings for the last 10 days:  Mean values apply above for all meters except median for One Touch  PRE-MEAL Fasting Lunch Dinner Bedtime Overall  Glucose range: 150-279  ?  170  108     Mean/median:        Self-care: The diet that the patient has been following is: tries to limit drinks which sugar .     Typical meal intake: Breakfast is Granola cereal usually.  Usually having salad at lunch, mixed meal at dinnertime.  Snacks are with meat, yogurt, cottage cheese               Dietician visit, most recent: 3/17               Exercise:  none  Weight history: Her maximum weight previously has been 221, she lost down to 179 with dietary changes  Wt Readings from Last 3 Encounters:  08/31/15 194 lb 12.8 oz (88.361 kg)  08/30/15 193 lb (87.544 kg)  08/17/15 192 lb (87.091 kg)    Glycemic control:   Lab Results  Component Value Date   HGBA1C 9.0* 05/30/2015   HGBA1C 8.0 03/30/2015   HGBA1C 7.3 01/04/2015  Lab Results  Component Value Date   MICROALBUR 1.9 07/23/2015   LDLCALC 71 01/04/2015   CREATININE 0.99 08/07/2015    No visits with results within 1 Week(s) from this visit. Latest known visit with results is:  Lab on 08/07/2015  Component Date Value Ref Range Status  . Sodium 08/07/2015 138  135 - 145 mEq/L Final  . Potassium 08/07/2015 4.5  3.5 - 5.1 mEq/L Final  . Chloride 08/07/2015 101  96 - 112 mEq/L Final  . CO2 08/07/2015 28  19 - 32 mEq/L Final  . Glucose, Bld 08/07/2015 379* 70 - 99 mg/dL Final  . BUN 08/07/2015 21  6 - 23 mg/dL Final  . Creatinine, Ser 08/07/2015 0.99  0.40 - 1.20 mg/dL Final  . Calcium 08/07/2015 9.8  8.4 - 10.5 mg/dL Final  . GFR 08/07/2015 59.25* >60.00 mL/min Final       Medication List       This list is accurate as of: 08/31/15  4:34 PM.  Always use your most recent med list.               ARIPiprazole 15 MG tablet  Commonly known as:   ABILIFY  Take 1 tablet (15 mg total) by mouth daily.     atorvastatin 40 MG tablet  Commonly known as:  LIPITOR  Take 1 tablet (40 mg total) by mouth daily.     ELIQUIS 5 MG Tabs tablet  Generic drug:  apixaban  Take 5 mg by mouth 2 (two) times daily.     Exenatide ER 2 MG Pen  Commonly known as:  BYDUREON  Inject the contents of one pen once per week     fenofibrate 145 MG tablet  Commonly known as:  TRICOR  Take 1 tablet (145 mg total) by mouth daily.     freestyle lancets  Test tid. DX E11.22     FREESTYLE LITE Devi  Use to check blood sugar tid. DX E11.22     furosemide 20 MG tablet  Commonly known as:  LASIX  Take 20 mg by mouth 2 (two) times daily.     glucose blood test strip  Commonly known as:  FREESTYLE LITE  Test tid. DX E11.22     insulin lispro 100 UNIT/ML KiwkPen  Commonly known as:  HUMALOG KWIKPEN  20 units before meals     Insulin Pen Needle 32G X 4 MM Misc  Commonly known as:  SURE COMFORT PEN NEEDLES  Use with insulin pens as directed. Dx E11.9     LANTUS SOLOSTAR 100 UNIT/ML Solostar Pen  Generic drug:  Insulin Glargine  Inject 60 Units into the skin at bedtime.     lidocaine 2 % solution  Commonly known as:  XYLOCAINE  Use as directed 20 mLs in the mouth or throat as needed for mouth pain.     lisinopril 10 MG tablet  Commonly known as:  PRINIVIL,ZESTRIL  Take 1 tablet (10 mg total) by mouth daily.     magnesium 30 MG tablet  Take 250 mg by mouth daily.     metoprolol 100 MG tablet  Commonly known as:  LOPRESSOR  Take 1 tablet (100 mg total) by mouth 2 (two) times daily.     metoprolol tartrate 25 MG tablet  Commonly known as:  LOPRESSOR  Take 1 tablet (25 mg total) by mouth 2 (two) times daily.     oxyCODONE-acetaminophen 10-325 MG tablet  Commonly known as:  PERCOCET  TAKE ONE  TABLET BY MOUTH FOUR TIMES A DAY AS NEEDED FOR 30 DAYS     potassium chloride 10 MEQ tablet  Commonly known as:  K-DUR,KLOR-CON  TAKE 1 TABLET 2 TIMES  DAILY WITH FOOD     pregabalin 50 MG capsule  Commonly known as:  LYRICA  1 pill morning and afternoon, 2 pills before bed     rizatriptan 10 MG disintegrating tablet  Commonly known as:  MAXALT-MLT  DISSOLVE 1 TABLET UNDER TONGUE IMMEDIATELY, MAY REPEAT DOSE IN 1 HOUR IF NEEDED- MAX OF 3 TABLETS IN 24 HOURS     SUMAtriptan 100 MG tablet  Commonly known as:  IMITREX  Take 1 tablet (100 mg total) by mouth See admin instructions.     tizanidine 6 MG capsule  Commonly known as:  ZANAFLEX  Take 1 capsule (6 mg total) by mouth 3 (three) times daily as needed for muscle spasms.     traMADol 50 MG tablet  Commonly known as:  ULTRAM  Take 1 tablet (50 mg total) by mouth every 6 (six) hours as needed. for pain     Vitamin D3 1000 units Caps  Take 1,000 Units by mouth daily.     zaleplon 10 MG capsule  Commonly known as:  SONATA  2  qhs        Allergies:  Allergies  Allergen Reactions  . Ivp Dye [Iodinated Diagnostic Agents] Swelling    Throat closes    Past Medical History  Diagnosis Date  . Diabetes mellitus without complication ([place])   . Hyperlipidemia   . Hypertension   . Bipolar affective ([place])   . Atrial fibrillation ([place])   . Chronic back pain   . Pain management   . CHF (congestive heart failure) ([place])   . Renal insufficiency     Past Surgical History  Procedure Laterality Date  . Thigh surgery    . Shoulder surgery Right   . Breast reduction surgery      Family History  Problem Relation Age of Onset  . Diabetes Mother   . Heart disease Mother   . Heart disease Brother   . Heart disease Sister     CABG  . Mental illness Brother   . Diabetes Brother     Social History:  reports that she has never smoked. She has never used smokeless tobacco. She reports that she does not drink alcohol or use illicit drugs.    Review of Systems   She has had some renal dysfunction, this is improving   Lipid history: Has been on fenofibrate and Lipitor for quite  some time    Lab Results  Component Value Date   CHOL 163 01/04/2015   HDL 42 01/04/2015   LDLCALC 71 01/04/2015   TRIG 249* 01/04/2015   CHOLHDL 3.9 01/04/2015           Hypertension: Treated with lisinopril and metoprolol.  She is also on high dose metoprolol for her atrial fibrillation  Most recent eye exam was in [place] in 2016  Most recent foot exam:2/17  Review of Systems    LABS:  No visits with results within 1 Week(s) from this visit. Latest known visit with results is:  Lab on 08/07/2015  Component Date Value Ref Range Status  . Sodium 08/07/2015 138  135 - 145 mEq/L Final  . Potassium 08/07/2015 4.5  3.5 - 5.1 mEq/L Final  . Chloride 08/07/2015 101  96 - 112 mEq/L Final  . CO2 08/07/2015 28  19 - 32 mEq/L Final  . Glucose, Bld 08/07/2015 379* 70 - 99 mg/dL Final  . BUN 08/07/2015 21  6 - 23 mg/dL Final  . Creatinine, Ser 08/07/2015 0.99  0.40 - 1.20 mg/dL Final  . Calcium 08/07/2015 9.8  8.4 - 10.5 mg/dL Final  . GFR 08/07/2015 59.25* >60.00 mL/min Final    Physical Examination:  BP 124/82 mmHg  Pulse 84  Temp(Src) 98.5 F (36.9 C)  Resp 14  Ht 5\' 2"  (1.575 m)  Wt 194 lb 12.8 oz (88.361 kg)  BMI 35.62 kg/m2  SpO2 92%     ASSESSMENT:  Diabetes type 2, uncontrolled with obesity and BMI 37 See history of present illness for detailed discussion of current diabetes management, blood sugar patterns and problems identified She is on a basal bolus insulin regimen along with Bydureon  She has had poor control this month because of getting steroids However blood sugars are starting to improve especially today when her fasting was 150 However because of her lack of adequate glucose monitoring difficult to get her patterns of blood sugars Has only one reading at suppertime which was fairly good and may be benefiting from Lantus twice a day May not have full benefit from [facility] but this may limit her weight gain   PLAN:    She will start  monitoring blood sugars more consistently at least once a day everyday  She will to half of her readings after meals  She will call her blood sugars to [place] next week for further insulin adjustment  Discussed needing to adjust Humalog based on postprandial readings  Follow instructions given by dietitian  will consider switching Lantus to [place] if covered, currently has a large supply of Lantus   Alternatively she may be a candidate for the V-go pump with U-500 insulin  Continue Bydureon weekly  Increase exercise as tolerated  Consider adding metformin on the next visit if renal function is consistently good  Patient Instructions  Check blood sugars on waking up 3-4 times a week Also check blood sugars about 2 hours after a meal and do this after different meals by rotation  Recommended blood sugar levels on waking up is 90-130 and about 2 hours after meal is 130-160  Please bring your blood sugar monitor to each visit, thank you  Increase lantus in pm to 44   If sugar > 200 after supper then may need 25 Humalog   Counseling time on subjects discussed above is over 50% of today's 25 minute visit  Carrell Palmatier 08/31/2015, 4:34 PM   Note: This office note was prepared with Dragon voice recognition system technology. Any transcriptional errors that result from this process are unintentional.

## 2015-08-31 NOTE — Patient Instructions (Signed)
Check blood sugars on waking up 3-4 times a week Also check blood sugars about 2 hours after a meal and do this after different meals by rotation  Recommended blood sugar levels on waking up is 90-130 and about 2 hours after meal is 130-160  Please bring your blood sugar monitor to each visit, thank you  Increase lantus in pm to 44   If sugar > 200 after supper then may need 25 Humalog

## 2015-09-03 ENCOUNTER — Other Ambulatory Visit (HOSPITAL_COMMUNITY): Payer: Self-pay | Admitting: Nurse Practitioner

## 2015-09-03 ENCOUNTER — Ambulatory Visit (HOSPITAL_COMMUNITY)
Admission: RE | Admit: 2015-09-03 | Discharge: 2015-09-03 | Disposition: A | Discharge status: Home / Self Care | Payer: Medicare Other | Source: Ambulatory Visit | Attending: Nurse Practitioner | Admitting: Nurse Practitioner

## 2015-09-03 ENCOUNTER — Other Ambulatory Visit (HOSPITAL_COMMUNITY): Payer: Self-pay | Admitting: *Deleted

## 2015-09-03 DIAGNOSIS — S32020A Wedge compression fracture of second lumbar vertebra, initial encounter for closed fracture: Secondary | ICD-10-CM | POA: Diagnosis not present

## 2015-09-03 DIAGNOSIS — M545 Low back pain: Secondary | ICD-10-CM

## 2015-09-03 DIAGNOSIS — R809 Proteinuria, unspecified: Secondary | ICD-10-CM | POA: Diagnosis not present

## 2015-09-03 DIAGNOSIS — M8580 Other specified disorders of bone density and structure, unspecified site: Secondary | ICD-10-CM | POA: Diagnosis not present

## 2015-09-03 DIAGNOSIS — M4854XA Collapsed vertebra, not elsewhere classified, thoracic region, initial encounter for fracture: Secondary | ICD-10-CM | POA: Insufficient documentation

## 2015-09-03 DIAGNOSIS — D509 Iron deficiency anemia, unspecified: Secondary | ICD-10-CM | POA: Diagnosis not present

## 2015-09-03 DIAGNOSIS — N183 Chronic kidney disease, stage 3 (moderate): Secondary | ICD-10-CM | POA: Diagnosis not present

## 2015-09-03 DIAGNOSIS — M4856XA Collapsed vertebra, not elsewhere classified, lumbar region, initial encounter for fracture: Secondary | ICD-10-CM | POA: Diagnosis not present

## 2015-09-03 DIAGNOSIS — Z79899 Other long term (current) drug therapy: Secondary | ICD-10-CM | POA: Diagnosis not present

## 2015-09-03 DIAGNOSIS — E559 Vitamin D deficiency, unspecified: Secondary | ICD-10-CM | POA: Diagnosis not present

## 2015-09-03 DIAGNOSIS — I10 Essential (primary) hypertension: Secondary | ICD-10-CM | POA: Diagnosis not present

## 2015-09-04 ENCOUNTER — Telehealth: Payer: Self-pay | Admitting: Family Medicine

## 2015-09-05 ENCOUNTER — Ambulatory Visit (HOSPITAL_COMMUNITY): Payer: Self-pay | Admitting: Psychiatry

## 2015-09-06 DIAGNOSIS — M47816 Spondylosis without myelopathy or radiculopathy, lumbar region: Secondary | ICD-10-CM | POA: Diagnosis not present

## 2015-09-06 DIAGNOSIS — G894 Chronic pain syndrome: Secondary | ICD-10-CM | POA: Diagnosis not present

## 2015-09-06 DIAGNOSIS — M5136 Other intervertebral disc degeneration, lumbar region: Secondary | ICD-10-CM | POA: Diagnosis not present

## 2015-09-06 DIAGNOSIS — Z79891 Long term (current) use of opiate analgesic: Secondary | ICD-10-CM | POA: Diagnosis not present

## 2015-09-07 NOTE — Telephone Encounter (Signed)
Spoke  to pt, they will be faxing over authorization

## 2015-09-12 DIAGNOSIS — E875 Hyperkalemia: Secondary | ICD-10-CM | POA: Diagnosis not present

## 2015-09-12 DIAGNOSIS — N183 Chronic kidney disease, stage 3 (moderate): Secondary | ICD-10-CM | POA: Diagnosis not present

## 2015-09-17 ENCOUNTER — Telehealth: Payer: Self-pay | Admitting: Endocrinology

## 2015-09-17 ENCOUNTER — Other Ambulatory Visit: Payer: Self-pay | Admitting: *Deleted

## 2015-09-17 MED ORDER — RIZATRIPTAN BENZOATE 10 MG PO TBDP
ORAL_TABLET | ORAL | Status: DC
Start: 2015-09-17 — End: 2016-01-07

## 2015-09-17 MED ORDER — TIZANIDINE HCL 6 MG PO CAPS: 6.0000 mg | ORAL_CAPSULE | Freq: Three times a day (TID) | ORAL | Status: DC | PRN

## 2015-09-17 NOTE — Telephone Encounter (Signed)
Patient is requesting a 90 day supply please. Is it ok for [place] to make it a 90 day supply

## 2015-09-17 NOTE — Telephone Encounter (Signed)
Noted, patient is aware. 

## 2015-09-17 NOTE — Telephone Encounter (Signed)
These are presumably fasting readings, can try reducing Lantus back to 40 instead of 44 Need to do half of her readings after meals before her next visit

## 2015-09-17 NOTE — Telephone Encounter (Signed)
Please patients glucose readings below.

## 2015-09-17 NOTE — Telephone Encounter (Signed)
Patient's b/s reading  4/9    94 4/10 105  181 4/11  93 4/12 131 4/13  81 4/14  108 4/15  127 4/16  70

## 2015-09-19 ENCOUNTER — Ambulatory Visit: Payer: Medicare Other | Attending: Family Medicine | Admitting: Physical Therapy

## 2015-09-19 ENCOUNTER — Encounter: Payer: Self-pay | Admitting: Physical Therapy

## 2015-09-19 DIAGNOSIS — M545 Low back pain, unspecified: Secondary | ICD-10-CM

## 2015-09-19 DIAGNOSIS — R293 Abnormal posture: Secondary | ICD-10-CM

## 2015-09-19 NOTE — Therapy (Signed)
[facility] [place], [place], [address] Phone: [phone]   Fax:  [phone]  Physical Therapy Treatment  Patient Details  Name: Amber Stephenson MRN: [id number] Date of Birth: [birth year] Referring Provider: Kenn File MD  Encounter Date: 09/19/2015      PT End of Session - 09/19/15 1502    Date for PT Re-Evaluation 10/11/15      Past Medical History  Diagnosis Date  . Diabetes mellitus without complication ([place])   . Hyperlipidemia   . Hypertension   . Bipolar affective ([place])   . Atrial fibrillation ([place])   . Chronic back pain   . Pain management   . CHF (congestive heart failure) ([place])   . Renal insufficiency     Past Surgical History  Procedure Laterality Date  . Thigh surgery    . Shoulder surgery Right   . Breast reduction surgery      There were no vitals filed for this visit.      Subjective Assessment - 09/19/15 1301    Subjective States that her back pain has not been bad today or yesterday and has not tried anything different. States that she recently had an xray completed and had two fractures that were new and now has an MRI rescheduled for 09/21/2015.   Limitations House hold activities   Patient Stated Goals Would like to decrease my pain for a better quality of life.   Currently in Pain? Yes   Pain Score 2    Pain Location Back   Pain Orientation Lower   Pain Onset More than a month ago            [facility] PT Assessment - 09/19/15 0001    Assessment   Medical Diagnosis Chronic low back pain.   Precautions   Precautions None                     [facility] Adult PT Treatment/Exercise - 09/19/15 0001    Modalities   Modalities Ultrasound   Ultrasound   Ultrasound Location R Lumbar Paraspinals   Ultrasound Parameters 1.5 w/cm2, 100%,1 mhz x10 min   Ultrasound Goals Pain   Manual Therapy   Manual Therapy Myofascial release   Myofascial Release MFR/TPR to R lumbar  paraspinals in L sidelying to decrease tightness                  PT Short Term Goals - 07/05/15 1927    PT SHORT TERM GOAL #1   Title Ind with a HEP.   Time 2   Period Weeks   Status New           PT Long Term Goals - 07/05/15 1927    PT LONG TERM GOAL #1   Title Perform ADL's with pain not > 3-4/10.   Time 6   Period Weeks   Status New   PT LONG TERM GOAL #2   Title Sit 30 minutes with pain not > 3-4/10.   Time 6   Period Weeks   Status New   PT LONG TERM GOAL #3   Title Transition from sit to stand with pain not > 4/10.   Time 6   Period Weeks   Status New               Plan - 09/19/15 1339    Clinical Impression Statement Patient tolerated today's treatment well and arrived with decreased LBP upon arrival. R lumbar paraspinals tightness was  noted upon palpation in which moderate tightness was noted. Patient noted tenderness of the treated musculature intermittantly during manual therapy and reported that she was unaware of the tightness. Normal [place] response noted end of the [place] session. Patient denied electrical stimulation and moist heat upon end of manual therapy. Patient denied pain folllowing today's treatment.   Rehab Potential Good   PT Frequency 2x / week   PT Duration 6 weeks   PT Treatment/Interventions ADLs/Self Care Home Management;Electrical Stimulation;Moist Heat;Therapeutic exercise;Therapeutic activities;Ultrasound;Patient/family education;Manual techniques   PT Next Visit Plan Modalites and STW/M to patient's affected low back region right > left.  Progress into low-level core exercises.   PT Home Exercise Plan HEP- SKTC, Figure 4 Piriformis stretch   Consulted and Agree with Plan of Care Patient      Patient will benefit from skilled therapeutic intervention in order to improve the following deficits and impairments:  Pain, Decreased activity tolerance, Decreased range of motion  Visit Diagnosis: Right-sided low back pain without  sciatica - Plan: PT plan of care cert/re-cert  Abnormal posture - Plan: PT plan of care cert/re-cert     Problem List Patient Active Problem List   Diagnosis Date Noted  . Herpes genitalis in women 07/16/2015  . Genital lesion, female 07/12/2015  . Chronic anticoagulation 05/30/2015  . Family history of coronary artery disease in sister 05/30/2015  . Acute on chronic diastolic CHF (congestive heart failure), NYHA class 1 ([place]) 05/30/2015  . Chest pain with moderate risk of acute coronary syndrome 05/29/2015  . Pain in the chest   . Dyspnea   . Essential hypertension   . RLS (restless legs syndrome) 04/27/2015  . Fall 02/26/2015  . Back pain 02/26/2015  . Acute low back pain due to trauma 02/26/2015  . Lipoma 02/08/2015  . Bipolar disorder ([place]) 01/23/2015  . Insomnia 01/23/2015  . CKD stage 3 due to type 2 diabetes mellitus ([place]) 01/05/2015  . Chronic back pain 01/04/2015  . Permanent atrial fibrillation ([place]) 01/04/2015  . Pruritus 01/04/2015  . Diabetes mellitus with stage 2 chronic kidney disease ([place])   . Hyperlipidemia    Ahmed Prima, PTA 09/19/2015 3:07 PM Mali Applegate MPT [facility] [address] [place], [place], [address] Phone: [phone]   Fax:  [phone]  Name: Amber Stephenson MRN: [id number] Date of Birth: July 14, 1945

## 2015-09-20 DIAGNOSIS — B351 Tinea unguium: Secondary | ICD-10-CM | POA: Diagnosis not present

## 2015-09-20 DIAGNOSIS — L84 Corns and callosities: Secondary | ICD-10-CM | POA: Diagnosis not present

## 2015-09-20 DIAGNOSIS — E1142 Type 2 diabetes mellitus with diabetic polyneuropathy: Secondary | ICD-10-CM | POA: Diagnosis not present

## 2015-09-21 DIAGNOSIS — M47816 Spondylosis without myelopathy or radiculopathy, lumbar region: Secondary | ICD-10-CM | POA: Diagnosis not present

## 2015-09-21 DIAGNOSIS — M438X6 Other specified deforming dorsopathies, lumbar region: Secondary | ICD-10-CM | POA: Diagnosis not present

## 2015-09-24 ENCOUNTER — Other Ambulatory Visit: Payer: Self-pay

## 2015-09-24 MED ORDER — INSULIN GLARGINE 100 UNIT/ML SOLOSTAR PEN: 60.0000 [IU] | PEN_INJECTOR | Freq: Every day | SUBCUTANEOUS | Status: DC

## 2015-09-25 ENCOUNTER — Encounter (INDEPENDENT_AMBULATORY_CARE_PROVIDER_SITE_OTHER): Payer: Self-pay

## 2015-09-25 ENCOUNTER — Encounter: Payer: Self-pay | Admitting: Family Medicine

## 2015-09-25 ENCOUNTER — Ambulatory Visit (INDEPENDENT_AMBULATORY_CARE_PROVIDER_SITE_OTHER): Payer: Medicare Other | Admitting: Family Medicine

## 2015-09-25 VITALS — BP 117/80 | HR 84 | Temp 96.8°F | Ht 62.0 in | Wt 198.2 lb

## 2015-09-25 DIAGNOSIS — F309 Manic episode, unspecified: Secondary | ICD-10-CM

## 2015-09-25 MED ORDER — ALPRAZOLAM 1 MG PO TABS
1.0000 mg | ORAL_TABLET | Freq: Three times a day (TID) | ORAL | Status: DC | PRN
Start: 2015-09-25 — End: 2015-10-16

## 2015-09-25 NOTE — Progress Notes (Signed)
   HPI  Patient presents today here with concern for acute manic episode.  Patient explains that over the last 4 days she's had increased irritability, hardly any sleep, racing thoughts These symptoms are consistent with previous manic episodes. She has good compliance with Abilify. She has been seeing her psychiatrist regularly.  She states that previous episode she's had quick resolution with benzodiazepines like Xanax or Valium. She has no suicidal thoughts  PMH: Smoking status noted ROS: Per HPI  Objective: BP 117/80 mmHg  Pulse 84  Temp(Src) 96.8 F (36 C) (Oral)  Ht 5\' 2"  (1.575 m)  Wt 198 lb 3.2 oz (89.903 kg)  BMI 36.24 kg/m2 Gen: NAD, alert, cooperative with exam HEENT: NCAT CV: RRR, good S1/S2, no murmur Resp: CTABL, no wheezes, non-labored Ext: No edema, warm Neuro: Alert and oriented, No gross deficits Psych: Anxious appearing, pressured speech, denies suicidal thoughts  Assessment and plan:  # Manic episode I agree with her assessment, her symptoms are consistent with acute manic episode We'll treat with small amount of benzodiazepines, discussed caution around using these and asked her to limit to use for only 3-4 days. She is already on temazepam for sleep via her psychiatrist I recommended that next time she check in with her psychiatrist for treatment of acute mania, she agrees.    Meds ordered this encounter  Medications  . ALPRAZolam (XANAX) 1 MG tablet    Sig: Take 1 tablet (1 mg total) by mouth 3 (three) times daily as needed for anxiety.    Dispense:  20 tablet    Refill:  0    Laroy Apple, MD [facility] 09/25/2015, 12:07 PM

## 2015-09-25 NOTE — Patient Instructions (Signed)
Great to see you!  Be sure to let Dr. Casimiro Needle know that you had this episode, he would normally be the best person to see for a manic episode  Take 1 xanax up to 3 times a day for no more than 3-5 days

## 2015-09-27 ENCOUNTER — Ambulatory Visit: Payer: Medicare Other | Admitting: Physical Therapy

## 2015-09-27 ENCOUNTER — Encounter: Payer: Self-pay | Admitting: Physical Therapy

## 2015-09-27 DIAGNOSIS — M545 Low back pain, unspecified: Secondary | ICD-10-CM

## 2015-09-27 DIAGNOSIS — R293 Abnormal posture: Secondary | ICD-10-CM | POA: Diagnosis not present

## 2015-09-27 NOTE — Therapy (Signed)
[facility] [place], [place], [address] Phone: [phone]   Fax:  [phone]  Physical Therapy Treatment  Patient Details  Name: Samiksha Vario MRN: [id number] Date of Birth: 28-Dec-1945 Referring Provider: Kenn File MD  Encounter Date: 09/27/2015      PT End of Session - 09/27/15 1458    Date for PT Re-Evaluation 10/25/15      Past Medical History  Diagnosis Date  . Diabetes mellitus without complication ([place])   . Hyperlipidemia   . Hypertension   . Bipolar affective ([place])   . Atrial fibrillation ([place])   . Chronic back pain   . Pain management   . CHF (congestive heart failure) ([place])   . Renal insufficiency     Past Surgical History  Procedure Laterality Date  . Thigh surgery    . Shoulder surgery Right   . Breast reduction surgery      There were no vitals filed for this visit.      Subjective Assessment - 09/27/15 1344    Subjective Patient reports increases tightness that she had not noticed before and used heat previously which helped. Reports that the MD had told her what they planned to do regarding the vertebral fractures but she cannot remember what they told her.    Limitations House hold activities   Patient Stated Goals Would like to decrease my pain for a better quality of life.   Currently in Pain? Yes   Pain Score 5    Pain Location Back   Pain Orientation Right;Lower   Pain Descriptors / Indicators Tightness   Pain Onset More than a month ago            [facility] PT Assessment - 09/27/15 0001    Assessment   Medical Diagnosis Chronic low back pain.   Precautions   Precautions None   Restrictions   Weight Bearing Restrictions No                     [facility] Adult PT Treatment/Exercise - 09/27/15 0001    Modalities   Modalities Ultrasound   Ultrasound   Ultrasound Location R Lumbar Paraspinals   Ultrasound Parameters 1.5 w/cm2, 100%, 30mhz x10 min   Ultrasound  Goals Pain   Manual Therapy   Manual Therapy Myofascial release   Myofascial Release MFR/TPR to R lumbar paraspinals in L sidelying to decrease tightness                  PT Short Term Goals - 07/05/15 1927    PT SHORT TERM GOAL #1   Title Ind with a HEP.   Time 2   Period Weeks   Status New           PT Long Term Goals - 07/05/15 1927    PT LONG TERM GOAL #1   Title Perform ADL's with pain not > 3-4/10.   Time 6   Period Weeks   Status New   PT LONG TERM GOAL #2   Title Sit 30 minutes with pain not > 3-4/10.   Time 6   Period Weeks   Status New   PT LONG TERM GOAL #3   Title Transition from sit to stand with pain not > 4/10.   Time 6   Period Weeks   Status New               Plan - 09/27/15 1448    Clinical Impression Statement Patient  arrived to clinic today with returned R low back pain and tightness. Moderate increased tightness noted upon palpation of lumbar musculature. Normal [place] response noted following end of the session with patient denying stimulation and moist heat today. Tightness that was palpated in R lumbar paraspinals decreased moderately with moderate pressure. Patient was educated by Madelyn Flavors, MPT regarding dry needling and its purpose and procedure. Patient demonstrated interest in trying dry needling in the next treatments. Patient was given print out regarding dry needling by Madelyn Flavors, MPT.   Rehab Potential Good   PT Frequency 2x / week   PT Duration 6 weeks   PT Treatment/Interventions ADLs/Self Care Home Management;Electrical Stimulation;Moist Heat;Therapeutic exercise;Therapeutic activities;Ultrasound;Patient/family education;Manual techniques   PT Next Visit Plan Continue as symptoms dictate per MPT POC. Recert for dry needling approval sent 09/27/2015   PT Home Exercise Plan HEP- SKTC, Figure 4 Piriformis stretch   Consulted and Agree with Plan of Care Patient      Patient will benefit from skilled therapeutic  intervention in order to improve the following deficits and impairments:  Pain, Decreased activity tolerance, Decreased range of motion  Visit Diagnosis: Right-sided low back pain without sciatica - Plan: PT plan of care cert/re-cert  Abnormal posture - Plan: PT plan of care cert/re-cert     Problem List Patient Active Problem List   Diagnosis Date Noted  . Herpes genitalis in women 07/16/2015  . Genital lesion, female 07/12/2015  . Chronic anticoagulation 05/30/2015  . Family history of coronary artery disease in sister 05/30/2015  . Acute on chronic diastolic CHF (congestive heart failure), NYHA class 1 ([place]) 05/30/2015  . Chest pain with moderate risk of acute coronary syndrome 05/29/2015  . Pain in the chest   . Dyspnea   . Essential hypertension   . RLS (restless legs syndrome) 04/27/2015  . Fall 02/26/2015  . Back pain 02/26/2015  . Acute low back pain due to trauma 02/26/2015  . Lipoma 02/08/2015  . Bipolar disorder ([place]) 01/23/2015  . Insomnia 01/23/2015  . CKD stage 3 due to type 2 diabetes mellitus ([place]) 01/05/2015  . Chronic back pain 01/04/2015  . Permanent atrial fibrillation ([place]) 01/04/2015  . Pruritus 01/04/2015  . Diabetes mellitus with stage 2 chronic kidney disease ([place])   . Hyperlipidemia     Ahmed Prima, PTA 09/27/2015 3:01 PM Mali Applegate MPT [facility] [address] [place], [place], [address] Phone: [phone]   Fax:  [phone]  Name: Kaylinn Rim MRN: [id number] Date of Birth: 1946-03-05

## 2015-09-28 ENCOUNTER — Other Ambulatory Visit: Payer: Self-pay

## 2015-10-01 ENCOUNTER — Emergency Department (HOSPITAL_COMMUNITY)
Admission: EM | Admit: 2015-10-01 | Discharge: 2015-10-03 | Payer: Medicare Other | Attending: Emergency Medicine | Admitting: Emergency Medicine

## 2015-10-01 ENCOUNTER — Ambulatory Visit (HOSPITAL_COMMUNITY)
Admission: RE | Admit: 2015-10-01 | Discharge: 2015-10-01 | Disposition: A | Discharge status: Home / Self Care | Payer: Medicare Other | Attending: Psychiatry | Admitting: Psychiatry

## 2015-10-01 ENCOUNTER — Encounter (HOSPITAL_COMMUNITY): Payer: Self-pay | Admitting: Emergency Medicine

## 2015-10-01 ENCOUNTER — Emergency Department (HOSPITAL_COMMUNITY): Payer: Medicare Other

## 2015-10-01 DIAGNOSIS — E785 Hyperlipidemia, unspecified: Secondary | ICD-10-CM | POA: Diagnosis not present

## 2015-10-01 DIAGNOSIS — Z79899 Other long term (current) drug therapy: Secondary | ICD-10-CM | POA: Diagnosis not present

## 2015-10-01 DIAGNOSIS — Z01818 Encounter for other preprocedural examination: Secondary | ICD-10-CM

## 2015-10-01 DIAGNOSIS — F319 Bipolar disorder, unspecified: Secondary | ICD-10-CM | POA: Diagnosis present

## 2015-10-01 DIAGNOSIS — E119 Type 2 diabetes mellitus without complications: Secondary | ICD-10-CM | POA: Diagnosis not present

## 2015-10-01 DIAGNOSIS — I4891 Unspecified atrial fibrillation: Secondary | ICD-10-CM | POA: Insufficient documentation

## 2015-10-01 DIAGNOSIS — R45851 Suicidal ideations: Secondary | ICD-10-CM

## 2015-10-01 DIAGNOSIS — I10 Essential (primary) hypertension: Secondary | ICD-10-CM | POA: Diagnosis not present

## 2015-10-01 DIAGNOSIS — F419 Anxiety disorder, unspecified: Secondary | ICD-10-CM | POA: Diagnosis not present

## 2015-10-01 DIAGNOSIS — F329 Major depressive disorder, single episode, unspecified: Secondary | ICD-10-CM

## 2015-10-01 DIAGNOSIS — Z87448 Personal history of other diseases of urinary system: Secondary | ICD-10-CM | POA: Diagnosis not present

## 2015-10-01 DIAGNOSIS — G8929 Other chronic pain: Secondary | ICD-10-CM | POA: Diagnosis not present

## 2015-10-01 DIAGNOSIS — F1310 Sedative, hypnotic or anxiolytic abuse, uncomplicated: Secondary | ICD-10-CM | POA: Insufficient documentation

## 2015-10-01 DIAGNOSIS — Z794 Long term (current) use of insulin: Secondary | ICD-10-CM | POA: Insufficient documentation

## 2015-10-01 DIAGNOSIS — S0990XA Unspecified injury of head, initial encounter: Secondary | ICD-10-CM | POA: Diagnosis not present

## 2015-10-01 DIAGNOSIS — I509 Heart failure, unspecified: Secondary | ICD-10-CM | POA: Diagnosis not present

## 2015-10-01 DIAGNOSIS — F3130 Bipolar disorder, current episode depressed, mild or moderate severity, unspecified: Secondary | ICD-10-CM | POA: Diagnosis not present

## 2015-10-01 DIAGNOSIS — F32A Depression, unspecified: Secondary | ICD-10-CM

## 2015-10-01 LAB — CBC
HCT: 43.5 % (ref 36.0–46.0)
Hemoglobin: 15 g/dL (ref 12.0–15.0)
MCH: 28.5 pg (ref 26.0–34.0)
MCHC: 34.5 g/dL (ref 30.0–36.0)
MCV: 82.7 fL (ref 78.0–100.0)
Platelets: 266 10*3/uL (ref 150–400)
RBC: 5.26 MIL/uL — ABNORMAL HIGH (ref 3.87–5.11)
RDW: 14.3 % (ref 11.5–15.5)
WBC: 9.3 10*3/uL (ref 4.0–10.5)

## 2015-10-01 LAB — COMPREHENSIVE METABOLIC PANEL
ALT: 18 U/L (ref 14–54)
AST: 21 U/L (ref 15–41)
Albumin: 4.2 g/dL (ref 3.5–5.0)
Alkaline Phosphatase: 95 U/L (ref 38–126)
Anion gap: 12 (ref 5–15)
BUN: 25 mg/dL — ABNORMAL HIGH (ref 6–20)
CO2: 21 mmol/L — ABNORMAL LOW (ref 22–32)
Calcium: 10.3 mg/dL (ref 8.9–10.3)
Chloride: 103 mmol/L (ref 101–111)
Creatinine, Ser: 1.01 mg/dL — ABNORMAL HIGH (ref 0.44–1.00)
GFR calc Af Amer: >60 mL/min (ref >60)
GFR calc non Af Amer: 56 mL/min — ABNORMAL LOW (ref >60)
Glucose, Bld: 320 mg/dL — ABNORMAL HIGH (ref 65–99)
Potassium: 4.6 mmol/L (ref 3.5–5.1)
Sodium: 136 mmol/L (ref 135–145)
Total Bilirubin: 0.4 mg/dL (ref 0.3–1.2)
Total Protein: 7.7 g/dL (ref 6.5–8.1)

## 2015-10-01 LAB — SALICYLATE LEVEL: Salicylate Lvl: <4 mg/dL (ref 2.8–30.0)

## 2015-10-01 LAB — CBG MONITORING, ED: Glucose-Capillary: 223 mg/dL — ABNORMAL HIGH (ref 65–99)

## 2015-10-01 LAB — RAPID URINE DRUG SCREEN, HOSP PERFORMED
Amphetamines: NOT DETECTED
Barbiturates: NOT DETECTED
Benzodiazepines: POSITIVE — AB
Cocaine: NOT DETECTED
Opiates: NOT DETECTED
Tetrahydrocannabinol: NOT DETECTED

## 2015-10-01 LAB — ETHANOL: Alcohol, Ethyl (B): <5 mg/dL (ref <5)

## 2015-10-01 LAB — ACETAMINOPHEN LEVEL: Acetaminophen (Tylenol), Serum: <10 ug/mL — ABNORMAL LOW (ref 10–30)

## 2015-10-01 MED ORDER — MAGNESIUM OXIDE 400 (241.3 MG) MG PO TABS
200.0000 mg | ORAL_TABLET | Freq: Every day | ORAL | Status: DC
  Administered 2015-10-01 – 2015-10-03 (×3): 200 mg via ORAL
  Filled 2015-10-01 (×3): qty 0.5

## 2015-10-01 MED ORDER — FUROSEMIDE 20 MG PO TABS
20.0000 mg | ORAL_TABLET | Freq: Every day | ORAL | Status: DC
  Administered 2015-10-01 – 2015-10-03 (×3): 20 mg via ORAL
  Filled 2015-10-01 (×3): qty 1

## 2015-10-01 MED ORDER — LISINOPRIL 5 MG PO TABS
5.0000 mg | ORAL_TABLET | Freq: Every day | ORAL | Status: DC
  Administered 2015-10-01 – 2015-10-03 (×3): 5 mg via ORAL
  Filled 2015-10-01 (×3): qty 1

## 2015-10-01 MED ORDER — INSULIN GLARGINE 100 UNIT/ML ~~LOC~~ SOLN
40.0000 [IU] | Freq: Two times a day (BID) | SUBCUTANEOUS | Status: DC
  Administered 2015-10-01 – 2015-10-03 (×5): 40 [IU] via SUBCUTANEOUS
  Filled 2015-10-01 (×5): qty 0.4

## 2015-10-01 MED ORDER — POTASSIUM CHLORIDE CRYS ER 10 MEQ PO TBCR
10.0000 meq | EXTENDED_RELEASE_TABLET | Freq: Every day | ORAL | Status: DC
  Administered 2015-10-01 – 2015-10-03 (×3): 10 meq via ORAL
  Filled 2015-10-01 (×3): qty 1

## 2015-10-01 MED ORDER — TIZANIDINE HCL 2 MG PO TABS
6.0000 mg | ORAL_TABLET | Freq: Three times a day (TID) | ORAL | Status: DC | PRN
  Administered 2015-10-01 – 2015-10-03 (×4): 6 mg via ORAL
  Filled 2015-10-01 (×5): qty 3

## 2015-10-01 MED ORDER — ARIPIPRAZOLE 15 MG PO TABS
15.0000 mg | ORAL_TABLET | Freq: Every day | ORAL | Status: DC
  Administered 2015-10-01 – 2015-10-03 (×3): 15 mg via ORAL
  Filled 2015-10-01 (×3): qty 1

## 2015-10-01 MED ORDER — METOPROLOL TARTRATE 25 MG PO TABS
100.0000 mg | ORAL_TABLET | Freq: Two times a day (BID) | ORAL | Status: DC
  Administered 2015-10-01 – 2015-10-03 (×5): 100 mg via ORAL
  Filled 2015-10-01 (×5): qty 4

## 2015-10-01 MED ORDER — TRAMADOL HCL 50 MG PO TABS
50.0000 mg | ORAL_TABLET | Freq: Four times a day (QID) | ORAL | Status: DC | PRN
  Administered 2015-10-01 – 2015-10-03 (×4): 50 mg via ORAL
  Filled 2015-10-01 (×4): qty 1

## 2015-10-01 MED ORDER — METOPROLOL TARTRATE 25 MG PO TABS
25.0000 mg | ORAL_TABLET | Freq: Two times a day (BID) | ORAL | Status: DC
  Administered 2015-10-01 – 2015-10-03 (×5): 25 mg via ORAL
  Filled 2015-10-01 (×6): qty 1

## 2015-10-01 MED ORDER — POTASSIUM CHLORIDE CRYS ER 20 MEQ PO TBCR: 20.0000 meq | EXTENDED_RELEASE_TABLET | Freq: Every day | ORAL | Status: DC

## 2015-10-01 MED ORDER — EXENATIDE ER 2 MG ~~LOC~~ PEN: 2.0000 mg | PEN_INJECTOR | SUBCUTANEOUS | Status: DC

## 2015-10-01 MED ORDER — ATORVASTATIN CALCIUM 40 MG PO TABS
40.0000 mg | ORAL_TABLET | Freq: Every day | ORAL | Status: DC
  Administered 2015-10-01 – 2015-10-03 (×3): 40 mg via ORAL
  Filled 2015-10-01 (×3): qty 1

## 2015-10-01 MED ORDER — PREGABALIN 50 MG PO CAPS
50.0000 mg | ORAL_CAPSULE | Freq: Two times a day (BID) | ORAL | Status: DC
  Administered 2015-10-01 (×2): 100 mg via ORAL
  Filled 2015-10-01 (×2): qty 2

## 2015-10-01 MED ORDER — ALPRAZOLAM 1 MG PO TABS
1.0000 mg | ORAL_TABLET | Freq: Three times a day (TID) | ORAL | Status: DC | PRN
  Administered 2015-10-01 – 2015-10-02 (×3): 1 mg via ORAL
  Filled 2015-10-01 (×3): qty 1

## 2015-10-01 MED ORDER — APIXABAN 5 MG PO TABS
5.0000 mg | ORAL_TABLET | Freq: Two times a day (BID) | ORAL | Status: DC
  Administered 2015-10-01 – 2015-10-03 (×5): 5 mg via ORAL
  Filled 2015-10-01 (×7): qty 1

## 2015-10-01 NOTE — ED Notes (Signed)
Patient transported to CT 

## 2015-10-01 NOTE — ED Notes (Signed)
Attempted to help patient walk over to [facility] and she states she doesn't think she can walk at this point as she began to walk. Prior to, pt was able to walk to the bathroom in her room, without assistance with steady gait.

## 2015-10-01 NOTE — BH Assessment (Addendum)
Assessment Note  Patient is a 70 year old female that reports suicidal ideation with a plan to cut her wrist.  Patient reports that the messages are coming to her and they are commanding her to cut herself. Patient reports that she is not able to contract for safety.   Patient reports a history of psychiatric inpatient hospitalizations.  Patient reports that she cut herself so bad that she was in the hospital for six weeks.   Patient reports increased mania, depression and anxiety since moving back to [place] from [place] with her husband.  Patient reports that the moved back to [place] when her husband retired from [employer].  Patient report that she has been married for 39 years.    Patent denies HI/Psychosis/Substance Abuse.  Patient denies physical, sexual or emotional abuse.    Diagnosis: Bipolar Disorder; Anxiety Disorder; Major Depressive Disorder   Past Medical History:  Past Medical History  Diagnosis Date  . Diabetes mellitus without complication ([place])   . Hyperlipidemia   . Hypertension   . Bipolar affective ([place])   . Atrial fibrillation ([place])   . Chronic back pain   . Pain management   . CHF (congestive heart failure) ([place])   . Renal insufficiency     Past Surgical History  Procedure Laterality Date  . Thigh surgery    . Shoulder surgery Right   . Breast reduction surgery      Family History:  Family History  Problem Relation Age of Onset  . Diabetes Mother   . Heart disease Mother   . Heart disease Brother   . Heart disease Sister     CABG  . Mental illness Brother   . Diabetes Brother     Social History:  reports that she has never smoked. She has never used smokeless tobacco. She reports that she does not drink alcohol or use illicit drugs.  Additional Social History:  Alcohol / Drug Use History of alcohol / drug use?: No history of alcohol / drug abuse  CIWA:   COWS:    Allergies:  Allergies  Allergen Reactions  . Ivp Dye [Iodinated Diagnostic Agents] Swelling     Throat closes    Home Medications:  (Not in a hospital admission)  OB/GYN Status:  No LMP recorded. Patient is postmenopausal.  General Assessment Data Location of Assessment: [facility] Assessment Services TTS Assessment: In system Is this a Tele or Face-to-Face Assessment?: Face-to-Face Is this an Initial Assessment or a Re-assessment for this encounter?: Initial Assessment Marital status: Married Is patient pregnant?: No Pregnancy Status: No Living Arrangements: Spouse/significant other Can pt return to current living arrangement?: Yes Admission Status: Voluntary Is patient capable of signing voluntary admission?: Yes Referral Source: Self/Family/Friend Insurance type: Medicare  Medical Screening Exam ([place]) Medical Exam completed: No Reason for MSE not completed: Other: (Sent to [facility] for medical clearance)  Crisis Care Plan Living Arrangements: Spouse/significant other Legal Guardian:  (NA) Name of Psychiatrist: [place]  (Dr. Mamie Nick) Name of Therapist: None Reported  Education Status Is patient currently in school?: No Current Grade: NA Highest grade of school patient has completed: NA Name of school: NA Contact person: NA  Risk to self with the past 6 months Suicidal Ideation: Yes-Currently Present Has patient been a risk to self within the past 6 months prior to admission? : Yes Suicidal Intent: Yes-Currently Present Has patient had any suicidal intent within the past 6 months prior to admission? : Yes Is patient at  risk for suicide?: Yes Suicidal Plan?: Yes-Currently Present Has patient had any suicidal plan within the past 6 months prior to admission? : Yes Specify Current Suicidal Plan: Cut her wrist Access to Means: Yes Specify Access to Suicidal Means: knife What has been your use of drugs/alcohol within the last 12 months?: NA Previous Attempts/Gestures: Yes How many times?: 1 Other Self Harm Risks: None Triggers for Past  Attempts: None known Intentional Self Injurious Behavior: None Family Suicide History: No Recent stressful life event(s): Other (Comment) (Recent medication change) Persecutory voices/beliefs?: Yes Depression: Yes Depression Symptoms: Despondent, Insomnia, Tearfulness, Isolating, Fatigue, Loss of interest in usual pleasures, Guilt, Feeling worthless/self pity, Feeling angry/irritable Substance abuse history and/or treatment for substance abuse?: No Suicide prevention information given to non-admitted patients: Yes  Risk to Others within the past 6 months Homicidal Ideation: No Does patient have any lifetime risk of violence toward others beyond the six months prior to admission? : No Thoughts of Harm to Others: No Current Homicidal Intent: No Current Homicidal Plan: No Access to Homicidal Means: No Identified Victim: None  History of harm to others?: No Assessment of Violence: None Noted Violent Behavior Description: None  Does patient have access to weapons?: No Criminal Charges Pending?: No Does patient have a court date: No Is patient on probation?: No  Psychosis Hallucinations: Auditory (Messages commandig her to kill herself ) Delusions: None noted  Mental Status Report Appearance/Hygiene: Disheveled Eye Contact: Fair Motor Activity: Freedom of movement Speech: Logical/coherent Level of Consciousness: Alert, Irritable Mood: Depressed, Anxious, Suspicious Affect: Anxious, Depressed, Irritable Anxiety Level: Moderate Thought Processes: Coherent, Relevant Judgement: Impaired Orientation: Person, Place, Time, Situation Obsessive Compulsive Thoughts/Behaviors: None  Cognitive Functioning Concentration: Decreased Memory: Remote Intact, Recent Intact IQ: Average Insight: Fair Impulse Control: Fair Appetite: Fair Weight Loss: 0 Weight Gain: 0 Sleep: Increased Total Hours of Sleep: 12 Vegetative Symptoms: Decreased grooming, Not bathing, Staying in  bed  ADLScreening [facility] Assessment Services) Patient's cognitive ability adequate to safely complete daily activities?: Yes Patient able to express need for assistance with ADLs?: Yes Independently performs ADLs?: Yes (appropriate for developmental age)  Prior Inpatient Therapy Prior Inpatient Therapy: Yes Prior Therapy Dates: 3 years ago Prior Therapy Facilty/Provider(s): Unable to remember Reason for Treatment: SI  Prior Outpatient Therapy Prior Outpatient Therapy: Yes Prior Therapy Dates: Ongoing  Prior Therapy Facilty/Provider(s): [facility] Reason for Treatment: Medication Management Does patient have an ACCT team?: No Does patient have Intensive In-House Services?  : No Does patient have [facility] services? : No Does patient have P4CC services?: No  ADL Screening (condition at time of admission) Patient's cognitive ability adequate to safely complete daily activities?: Yes Is the patient deaf or have difficulty hearing?: No Does the patient have difficulty seeing, even when wearing glasses/contacts?: No Does the patient have difficulty concentrating, remembering, or making decisions?: Yes Patient able to express need for assistance with ADLs?: Yes Does the patient have difficulty dressing or bathing?: No Independently performs ADLs?: Yes (appropriate for developmental age) Does the patient have difficulty walking or climbing stairs?: Yes Weakness of Legs: Both Weakness of Arms/Hands: None  Home Assistive Devices/Equipment Home Assistive Devices/Equipment: None    Abuse/Neglect Assessment (Assessment to be complete while patient is alone) Physical Abuse: Denies Verbal Abuse: Denies Sexual Abuse: Denies Exploitation of patient/patient's resources: Denies Self-Neglect: Denies Values / Beliefs Cultural Requests During Hospitalization: None Spiritual Requests During Hospitalization: None Consults Spiritual Care Consult Needed: No Social Work Consult  Needed: No Regulatory affairs officer (For Healthcare) Does  patient have an advance directive?: No Would patient like information on creating an advanced directive?: No - patient declined information    Additional Information 1:1 In Past 12 Months?: No CIRT Risk: No Elopement Risk: No Does patient have medical clearance?: Yes     Disposition: Per Mickel Baas, NP - patient meets criteria for inpatient hospitalization.  Per [facility] Otila Kluver) no appropriate beds at [facility].  TTS will seek placement.  Disposition Initial Assessment Completed for this Encounter: Yes Disposition of Patient: Inpatient treatment program  On Site Evaluation by:   Reviewed with Physician:    Graciella Freer [place] 10/01/2015 12:10 PM

## 2015-10-01 NOTE — BH Assessment (Signed)
Per Mickel Baas, NP - patient meets criteria for inpatient.  Per Mickel Baas patient will be sent to [facility] due to the patient experiencing a racing heart and history of hear problems in the past.   Per Otila Kluver, no appropriate beds at [facility].  TTS will seek placement.

## 2015-10-01 NOTE — ED Notes (Addendum)
Pt husband called to see how she is doing. During the conversation, he mentions patient falling off her bed hitting the back of her head Sunday but he didn't notice any bruises. Pt  c/o being sore in back more that usual. She admits to chronic pain from broken vertebrae from previous fall in September. He states she has been having difficulty staying awake and keeping balance since a change in medications.

## 2015-10-01 NOTE — ED Notes (Signed)
Pt has been seen by TTS.  No bed currently available at [facility].

## 2015-10-01 NOTE — ED Provider Notes (Addendum)
CSN: [id number]     Arrival date & time 10/01/15  1137 History   First MD Initiated Contact with Patient 10/01/15 1216     Chief Complaint  Patient presents with  . Medical Clearance  . Suicidal     (Consider location/radiation/quality/duration/timing/severity/associated sxs/prior Treatment) The history is provided by the patient.  Patient w hx bipolar disorder, c/o feeling manic and depressed in the past week. Symptoms moderate, persistent, constant, worsening. Denies specific exacerbating or alleviating factors. +suicidal thoughts, no specific plan or attempt to harm self. States not taking her meds regularly/poorly compliant.  States has psychiatrist/[place], but hasnt seen in over a month. Denies recent physical illness.      Past Medical History  Diagnosis Date  . Diabetes mellitus without complication ([place])   . Hyperlipidemia   . Hypertension   . Bipolar affective ([place])   . Atrial fibrillation ([place])   . Chronic back pain   . Pain management   . CHF (congestive heart failure) ([place])   . Renal insufficiency    Past Surgical History  Procedure Laterality Date  . Thigh surgery    . Shoulder surgery Right   . Breast reduction surgery     Family History  Problem Relation Age of Onset  . Diabetes Mother   . Heart disease Mother   . Heart disease Brother   . Heart disease Sister     CABG  . Mental illness Brother   . Diabetes Brother    Social History  Substance Use Topics  . Smoking status: Never Smoker   . Smokeless tobacco: Never Used  . Alcohol Use: No   OB History    No data available     Review of Systems  Constitutional: Negative for fever and chills.  HENT: Negative for sore throat.   Eyes: Negative for redness.  Respiratory: Negative for shortness of breath.   Cardiovascular: Negative for chest pain.  Gastrointestinal: Negative for vomiting, abdominal pain and diarrhea.  Genitourinary: Negative for flank pain.  Musculoskeletal: Negative for back  pain and neck pain.  Skin: Negative for rash.  Neurological: Negative for headaches.  Hematological: Does not bruise/bleed easily.  Psychiatric/Behavioral: Positive for dysphoric mood. The patient is nervous/anxious.       Allergies  Ivp dye  Home Medications   Prior to Admission medications   Medication Sig Start Date End Date Taking? Authorizing Provider  ALPRAZolam Duanne Moron) 1 MG tablet Take 1 tablet (1 mg total) by mouth 3 (three) times daily as needed for anxiety. 09/25/15  Yes Timmothy Euler, MD  ARIPiprazole (ABILIFY) 15 MG tablet Take 1 tablet (15 mg total) by mouth daily. 08/17/15  Yes Norma Fredrickson, MD  atorvastatin (LIPITOR) 40 MG tablet Take 1 tablet (40 mg total) by mouth daily. 08/16/15  Yes Timmothy Euler, MD  Exenatide ER (BYDUREON) 2 MG PEN Inject the contents of one pen once per week 08/15/15  Yes Elayne Snare, MD  fenofibrate (TRICOR) 145 MG tablet Take 1 tablet (145 mg total) by mouth daily. 08/16/15  Yes Timmothy Euler, MD  Insulin Glargine (LANTUS SOLOSTAR) 100 UNIT/ML Solostar Pen Inject 60 Units into the skin at bedtime. Patient taking differently: Inject 40 Units into the skin 2 (two) times daily.  09/24/15  Yes Timmothy Euler, MD  lisinopril (PRINIVIL,ZESTRIL) 5 MG tablet Take 5 mg by mouth daily.   Yes Historical Provider, MD  metoprolol (LOPRESSOR) 100 MG tablet Take 1 tablet (100 mg total) by mouth 2 (two) times daily.  04/16/15  Yes Timmothy Euler, MD  metoprolol tartrate (LOPRESSOR) 25 MG tablet Take 1 tablet (25 mg total) by mouth 2 (two) times daily. 08/15/15  Yes Herminio Commons, MD  pregabalin (LYRICA) 50 MG capsule 1 pill morning and afternoon, 2 pills before bed Patient taking differently: Take 50-100 mg by mouth 2 (two) times daily. 1 pill morning and afternoon, 2 pills before bed 04/16/15  Yes Timmothy Euler, MD  rizatriptan (MAXALT-MLT) 10 MG disintegrating tablet DISSOLVE 1 TABLET UNDER TONGUE IMMEDIATELY, MAY REPEAT DOSE IN 1 HOUR IF  NEEDED- MAX OF 3 TABLETS IN 24 HOURS 09/17/15  Yes Timmothy Euler, MD  SUMAtriptan (IMITREX) 100 MG tablet Take 1 tablet (100 mg total) by mouth See admin instructions. 06/05/15  Yes Timmothy Euler, MD  tizanidine (ZANAFLEX) 6 MG capsule Take 1 capsule (6 mg total) by mouth 3 (three) times daily as needed for muscle spasms. 09/17/15  Yes Timmothy Euler, MD  traMADol (ULTRAM) 50 MG tablet Take 1 tablet (50 mg total) by mouth every 6 (six) hours as needed. for pain 08/30/15  Yes Timmothy Euler, MD  apixaban (ELIQUIS) 5 MG TABS tablet Take 5 mg by mouth 2 (two) times daily.    Historical Provider, MD  Blood Glucose Monitoring Suppl (FREESTYLE LITE) DEVI Use to check blood sugar tid. DX E11.22 04/04/15   Timmothy Euler, MD  Cholecalciferol (VITAMIN D3) 1000 UNITS CAPS Take 1,000 Units by mouth daily.    Historical Provider, MD  furosemide (LASIX) 20 MG tablet Take 20 mg by mouth 2 (two) times daily.    Historical Provider, MD  glucose blood (FREESTYLE LITE) test strip Test tid. DX E11.22 04/04/15   Timmothy Euler, MD  insulin lispro (HUMALOG KWIKPEN) 100 UNIT/ML KiwkPen 20 units before meals Patient not taking: Reported on 10/01/2015 08/15/15   Elayne Snare, MD  Insulin Pen Needle (SURE COMFORT PEN NEEDLES) 32G X 4 MM MISC Use with insulin pens as directed. Dx E11.9 08/16/15   Timmothy Euler, MD  Lancets (FREESTYLE) lancets Test tid. DX E11.22 04/04/15   Timmothy Euler, MD  lidocaine (XYLOCAINE) 2 % solution Use as directed 20 mLs in the mouth or throat as needed for mouth pain. Patient not taking: Reported on 10/01/2015 06/25/15   Timmothy Euler, MD  lisinopril (PRINIVIL,ZESTRIL) 10 MG tablet Take 1 tablet (10 mg total) by mouth daily. Patient not taking: Reported on 10/01/2015 08/16/15   Timmothy Euler, MD  magnesium 30 MG tablet Take 250 mg by mouth daily.    Historical Provider, MD  oxyCODONE-acetaminophen (PERCOCET) 10-325 MG tablet TAKE ONE TABLET BY MOUTH FOUR TIMES A DAY AS NEEDED FOR  30 DAYS 05/11/15   Historical Provider, MD  potassium chloride (K-DUR,KLOR-CON) 10 MEQ tablet TAKE 1 TABLET 2 TIMES DAILY WITH FOOD 11/06/14   Historical Provider, MD  zaleplon (SONATA) 10 MG capsule 2  qhs Patient taking differently: Take 20 mg by mouth at bedtime as needed for sleep.  08/17/15   Norma Fredrickson, MD   BP 114/88 mmHg  Pulse 85  Temp(Src) 97.6 F (36.4 C) (Oral)  Resp 16  SpO2 97% Physical Exam  Constitutional: She is oriented to person, place, and time. She appears well-developed and well-nourished. No distress.  HENT:  Mouth/Throat: Oropharynx is clear and moist.  Eyes: Conjunctivae are normal. Pupils are equal, round, and reactive to light. No scleral icterus.  Neck: Neck supple. No tracheal deviation present.  Cardiovascular: Normal rate, regular  rhythm, normal heart sounds and intact distal pulses.   No murmur heard. Pulmonary/Chest: Effort normal and breath sounds normal. No respiratory distress.  Abdominal: Soft. Normal appearance and bowel sounds are normal. She exhibits no distension. There is no tenderness.  Musculoskeletal: She exhibits no edema.  Neurological: She is alert and oriented to person, place, and time.  Steady gait.   Skin: Skin is warm and dry. No rash noted. She is not diaphoretic.  Psychiatric:  Anxious. Tearful. +SI.  Nursing note and vitals reviewed.   ED Course  Procedures (including critical care time) Labs Review   Results for orders placed or performed during the hospital encounter of 10/01/15  Comprehensive metabolic panel  Result Value Ref Range   Sodium 136 135 - 145 mmol/L   Potassium 4.6 3.5 - 5.1 mmol/L   Chloride 103 101 - 111 mmol/L   CO2 21 (L) 22 - 32 mmol/L   Glucose, Bld 320 (H) 65 - 99 mg/dL   BUN 25 (H) 6 - 20 mg/dL   Creatinine, Ser 1.01 (H) 0.44 - 1.00 mg/dL   Calcium 10.3 8.9 - 10.3 mg/dL   Total Protein 7.7 6.5 - 8.1 g/dL   Albumin 4.2 3.5 - 5.0 g/dL   AST 21 15 - 41 U/L   ALT 18 14 - 54 U/L   Alkaline  Phosphatase 95 38 - 126 U/L   Total Bilirubin 0.4 0.3 - 1.2 mg/dL   GFR calc non Af Amer 56 (L) >60 mL/min   GFR calc Af Amer >60 >60 mL/min   Anion gap 12 5 - 15  Ethanol  Result Value Ref Range   Alcohol, Ethyl (B) <5 <5 mg/dL  Salicylate level  Result Value Ref Range   Salicylate Lvl 123456 2.8 - 30.0 mg/dL  Acetaminophen level  Result Value Ref Range   Acetaminophen (Tylenol), Serum <10 (L) 10 - 30 ug/mL  cbc  Result Value Ref Range   WBC 9.3 4.0 - 10.5 K/uL   RBC 5.26 (H) 3.87 - 5.11 MIL/uL   Hemoglobin 15.0 12.0 - 15.0 g/dL   HCT 43.5 36.0 - 46.0 %   MCV 82.7 78.0 - 100.0 fL   MCH 28.5 26.0 - 34.0 pg   MCHC 34.5 30.0 - 36.0 g/dL   RDW 14.3 11.5 - 15.5 %   Platelets 266 150 - 400 K/uL  Rapid urine drug screen (hospital performed)  Result Value Ref Range   Opiates NONE DETECTED NONE DETECTED   Cocaine NONE DETECTED NONE DETECTED   Benzodiazepines POSITIVE (A) NONE DETECTED   Amphetamines NONE DETECTED NONE DETECTED   Tetrahydrocannabinol NONE DETECTED NONE DETECTED   Barbiturates NONE DETECTED NONE DETECTED   Dg Lumbar Spine Complete W/bend  09/03/2015  CLINICAL DATA:  Low back pain.  Initial evaluation. EXAM: LUMBAR SPINE - COMPLETE WITH BENDING VIEWS COMPARISON:  02/26/2015. FINDINGS: Mild scoliosis lumbar spine concave right. No acute bony abnormality identified. Diffuse degenerative change . Mild T12 and L2 compression fractures. Calcific densities noted over the left abdomen most likely a pill fragment. IMPRESSION: Mild T12 and L2 compression fractures, new from prior study of 02/26/2015. Diffuse osteopenia degenerative change. Electronically Signed   By: Marcello Moores  Register   On: 09/03/2015 15:57      I have personally reviewed and evaluated these images and lab results as part of my medical decision-making.    MDM   Labs.  Behavioral health team consulted.   Reviewed nursing notes and prior charts for additional history.  Per home meds, patient verifies she  is currently only on lantus 40 units bid, and bydureon 2 mg q Sunday, patient affirms she is not currently supposed to be taking any humalog, or sliding scale insulin with meals.   Disposition per [facility] team.        Lajean Saver, MD 10/01/15 [phone]

## 2015-10-01 NOTE — ED Notes (Signed)
Patient sent here from [facility] for medical clearance. Reports SI thoughts. Plan to hurt self by cutting. Hx of same. Denies auditory/visual hallucinations.

## 2015-10-02 ENCOUNTER — Emergency Department (HOSPITAL_COMMUNITY): Payer: Medicare Other

## 2015-10-02 DIAGNOSIS — F3130 Bipolar disorder, current episode depressed, mild or moderate severity, unspecified: Secondary | ICD-10-CM | POA: Diagnosis not present

## 2015-10-02 DIAGNOSIS — F319 Bipolar disorder, unspecified: Secondary | ICD-10-CM

## 2015-10-02 DIAGNOSIS — Z01818 Encounter for other preprocedural examination: Secondary | ICD-10-CM | POA: Insufficient documentation

## 2015-10-02 DIAGNOSIS — R45851 Suicidal ideations: Secondary | ICD-10-CM | POA: Diagnosis not present

## 2015-10-02 DIAGNOSIS — I4891 Unspecified atrial fibrillation: Secondary | ICD-10-CM | POA: Diagnosis not present

## 2015-10-02 DIAGNOSIS — F329 Major depressive disorder, single episode, unspecified: Secondary | ICD-10-CM | POA: Insufficient documentation

## 2015-10-02 LAB — URINALYSIS, ROUTINE W REFLEX MICROSCOPIC
Bilirubin Urine: NEGATIVE
Glucose, UA: NEGATIVE mg/dL
Hgb urine dipstick: NEGATIVE
Ketones, ur: NEGATIVE mg/dL
Nitrite: NEGATIVE
Protein, ur: NEGATIVE mg/dL
Specific Gravity, Urine: 1.01 (ref 1.005–1.030)
pH: 6 (ref 5.0–8.0)

## 2015-10-02 LAB — CBG MONITORING, ED
Glucose-Capillary: 150 mg/dL — ABNORMAL HIGH (ref 65–99)
Glucose-Capillary: 173 mg/dL — ABNORMAL HIGH (ref 65–99)

## 2015-10-02 LAB — URINE MICROSCOPIC-ADD ON: RBC / HPF: NONE SEEN RBC/hpf (ref 0–5)

## 2015-10-02 LAB — HEMOGLOBIN A1C
Hgb A1c MFr Bld: 9.5 % — ABNORMAL HIGH (ref 4.8–5.6)
Mean Plasma Glucose: 226 mg/dL

## 2015-10-02 MED ORDER — IPRATROPIUM-ALBUTEROL 0.5-2.5 (3) MG/3ML IN SOLN
3.0000 mL | Freq: Once | RESPIRATORY_TRACT | Status: AC
  Administered 2015-10-02: 3 mL via RESPIRATORY_TRACT
  Filled 2015-10-02: qty 3

## 2015-10-02 MED ORDER — PREGABALIN 50 MG PO CAPS
100.0000 mg | ORAL_CAPSULE | Freq: Every day | ORAL | Status: DC
  Administered 2015-10-02: 100 mg via ORAL
  Filled 2015-10-02: qty 2

## 2015-10-02 MED ORDER — LORAZEPAM 1 MG PO TABS
1.0000 mg | ORAL_TABLET | Freq: Three times a day (TID) | ORAL | Status: DC | PRN
  Administered 2015-10-02 – 2015-10-03 (×4): 1 mg via ORAL
  Filled 2015-10-02 (×4): qty 1

## 2015-10-02 MED ORDER — SUMATRIPTAN SUCCINATE 100 MG PO TABS
100.0000 mg | ORAL_TABLET | ORAL | Status: DC | PRN
  Administered 2015-10-02: 100 mg via ORAL
  Filled 2015-10-02: qty 1

## 2015-10-02 MED ORDER — IBUPROFEN 800 MG PO TABS
800.0000 mg | ORAL_TABLET | Freq: Once | ORAL | Status: AC
  Administered 2015-10-02: 800 mg via ORAL
  Filled 2015-10-02: qty 1

## 2015-10-02 MED ORDER — PREGABALIN 50 MG PO CAPS
50.0000 mg | ORAL_CAPSULE | Freq: Two times a day (BID) | ORAL | Status: DC
  Administered 2015-10-02 – 2015-10-03 (×3): 50 mg via ORAL
  Filled 2015-10-02 (×3): qty 1

## 2015-10-02 NOTE — Progress Notes (Signed)
10/02/15  1120  EKG completed

## 2015-10-02 NOTE — Progress Notes (Signed)
Pamala Hurry of [facility] reached out to CSW requesting past EKG. CSW faxed the requested documents.  Willette Brace [id number] ED CSW 10/02/2015 11:07 PM

## 2015-10-02 NOTE — Progress Notes (Signed)
Pt refuses CPAP 

## 2015-10-02 NOTE — ED Provider Notes (Signed)
It was reported that patient had a fall several days ago and is on Eliquis with concern for hitting her head. She has had some drowsiness and difficulty walking however this may be due to medication. CT of the head was ordered to confirm no intra-or cranial bleeding. CT was negative. CT Head Wo Contrast (Final result) Result time: 10/01/15 23:16:08   Final result by Rad Results In Interface (10/01/15 23:16:08)   Narrative:   CLINICAL DATA: History of bipolar disorder. History of fall.  EXAM: CT HEAD WITHOUT CONTRAST  TECHNIQUE: Contiguous axial images were obtained from the base of the skull through the vertex without intravenous contrast.  COMPARISON: None.  FINDINGS: Regional soft tissues appear normal. No radiopaque foreign body. Incidentally noted hyperostosis frontalis. No displaced calvarial fracture.  Gray-white differentiation is maintained. No CT evidence of acute large territory infarct. No intraparenchymal or extra-axial mass or hemorrhage. Normal size and configuration of the ventricles and basilar cisterns. No midline shift. Limited visualization of the paranasal sinuses and mastoid air cells is normal. No air-fluid levels.  IMPRESSION: Negative noncontrast head CT.   Electronically Signed By: Sandi Mariscal M.D. On: 10/01/2015 23:16        Blanchie Dessert, MD 10/02/15 [phone]

## 2015-10-02 NOTE — ED Notes (Signed)
Pt alert x4, resting watching TV sitter at the bedside will continue to monitor.

## 2015-10-02 NOTE — Consult Note (Signed)
[facility] Consult   Reason for Consult:  Anger, Agitation, hostile  And suicidal ideation. Referring Physician:  EDP Patient Identification: Amber Stephenson MRN:  [id number] Principal Diagnosis: Bipolar disorder [facility]) Diagnosis:   Patient Active Problem List   Diagnosis Date Noted  . Bipolar disorder ([place]) [F31.9] 01/23/2015    Priority: High  . Herpes genitalis in women [A60.9] 07/16/2015  . Genital lesion, female [N94.9] 07/12/2015  . Chronic anticoagulation [Z79.01] 05/30/2015  . Family history of coronary artery disease in sister [Z82.49] 05/30/2015  . Acute on chronic diastolic CHF (congestive heart failure), NYHA class 1 ([place]) [I50.33] 05/30/2015  . Chest pain with moderate risk of acute coronary syndrome [R07.9] 05/29/2015  . Pain in the chest [R07.9]   . Dyspnea [R06.00]   . Essential hypertension [I10]   . RLS (restless legs syndrome) [G25.81] 04/27/2015  . Fall [W19.XXXA] 02/26/2015  . Back pain [M54.9] 02/26/2015  . Acute low back pain due to trauma [M54.5] 02/26/2015  . Lipoma [D17.9] 02/08/2015  . Insomnia [G47.00] 01/23/2015  . CKD stage 3 due to type 2 diabetes mellitus ([place]) [L97.67, N18.3] 01/05/2015  . Chronic back pain [M54.9, G89.29] 01/04/2015  . Permanent atrial fibrillation ([place]) [I48.2] 01/04/2015  . Pruritus [L29.9] 01/04/2015  . Diabetes mellitus with stage 2 chronic kidney disease (HCC) [E11.22, N18.2]   . Hyperlipidemia [E78.5]     Total Time spent with patient: 45 minutes  Subjective:   Amber Stephenson is a 70 y.o. female patient admitted with Anger, Agitation, hostile  And suicidal ideation.  HPI:  Caucasian female, 70 years old was evaluated for excessive anger, hostility towards husband and snapping at him.  Patient states she has been diagnosed with Bipolar disorder and has been stable on Abilify.  She states that in the past 8 days she has been angry and agitated and mean to her husband.  She reports poor sleep and stays awake  most of the night cleaning.   Patient reports suicidal feelings with plans to cut her wrist and she has a hx of previous cutting her wrist.  She was hospitalized 3 years ago, she denies any illicit drug use or Alcohol use.  She has been accepted for admission and we will be seeking placement at any gero-psychiatry facility.  Past Psychiatric History:  Bipolar disorder  Risk to Self: Suicidal Ideation: Yes-Currently Present Suicidal Intent: Yes-Currently Present Is patient at risk for suicide?: Yes Suicidal Plan?: Yes-Currently Present Specify Current Suicidal Plan: Cut her wrist Access to Means: Yes Specify Access to Suicidal Means: knife What has been your use of drugs/alcohol within the last 12 months?: NA How many times?: 1 Other Self Harm Risks: None Triggers for Past Attempts: None known Intentional Self Injurious Behavior: None Risk to Others: Homicidal Ideation: No Thoughts of Harm to Others: No Current Homicidal Intent: No Current Homicidal Plan: No Access to Homicidal Means: No Identified Victim: None  History of harm to others?: No Assessment of Violence: None Noted Violent Behavior Description: None  Does patient have access to weapons?: No Criminal Charges Pending?: No Does patient have a court date: No Prior Inpatient Therapy: Prior Inpatient Therapy: Yes Prior Therapy Dates: 3 years ago Prior Therapy Facilty/Provider(s): Unable to remember Reason for Treatment: SI Prior Outpatient Therapy: Prior Outpatient Therapy: Yes Prior Therapy Dates: Ongoing  Prior Therapy Facilty/Provider(s): [facility] Reason for Treatment: Medication Management Does patient have an ACCT team?: No Does patient have Intensive In-House Services?  : No Does patient have [facility] services? :  No Does patient have P4CC services?: No  Past Medical History:  Past Medical History  Diagnosis Date  . Diabetes mellitus without complication ([place])   . Hyperlipidemia   .  Hypertension   . Bipolar affective ([place])   . Atrial fibrillation ([place])   . Chronic back pain   . Pain management   . CHF (congestive heart failure) ([place])   . Renal insufficiency     Past Surgical History  Procedure Laterality Date  . Thigh surgery    . Shoulder surgery Right   . Breast reduction surgery     Family History:  Family History  Problem Relation Age of Onset  . Diabetes Mother   . Heart disease Mother   . Heart disease Brother   . Heart disease Sister     CABG  . Mental illness Brother   . Diabetes Brother    Family Psychiatric  History:  Denies Social History:  History  Alcohol Use No     History  Drug Use No    Social History   Social History  . Marital Status: Married    Spouse Name: N/A  . Number of Children: 3  . Years of Education: N/A   Social History Main Topics  . Smoking status: Never Smoker   . Smokeless tobacco: Never Used  . Alcohol Use: No  . Drug Use: No  . Sexual Activity: Yes    Birth Control/ Protection: Post-menopausal   Other Topics Concern  . None   Social History Narrative   Lives at home with husband.    Additional Social History:    Allergies:   Allergies  Allergen Reactions  . Ivp Dye [Iodinated Diagnostic Agents] Swelling    Throat closes    Labs:  Results for orders placed or performed during the hospital encounter of 10/01/15 (from the past 48 hour(s))  Rapid urine drug screen (hospital performed)     Status: Abnormal   Collection Time: 10/01/15 11:54 AM  Result Value Ref Range   Opiates NONE DETECTED NONE DETECTED   Cocaine NONE DETECTED NONE DETECTED   Benzodiazepines POSITIVE (A) NONE DETECTED   Amphetamines NONE DETECTED NONE DETECTED   Tetrahydrocannabinol NONE DETECTED NONE DETECTED   Barbiturates NONE DETECTED NONE DETECTED    Comment:        DRUG SCREEN FOR MEDICAL PURPOSES ONLY.  IF CONFIRMATION IS NEEDED FOR ANY PURPOSE, NOTIFY LAB WITHIN 5 DAYS.        LOWEST DETECTABLE LIMITS FOR  URINE DRUG SCREEN Drug Class       Cutoff (ng/mL) Amphetamine      1000 Barbiturate      200 Benzodiazepine   269 Tricyclics       485 Opiates          300 Cocaine          300 THC              50   Comprehensive metabolic panel     Status: Abnormal   Collection Time: 10/01/15 12:23 PM  Result Value Ref Range   Sodium 136 135 - 145 mmol/L   Potassium 4.6 3.5 - 5.1 mmol/L   Chloride 103 101 - 111 mmol/L   CO2 21 (L) 22 - 32 mmol/L   Glucose, Bld 320 (H) 65 - 99 mg/dL   BUN 25 (H) 6 - 20 mg/dL   Creatinine, Ser 1.01 (H) 0.44 - 1.00 mg/dL   Calcium 10.3 8.9 - 10.3 mg/dL  Total Protein 7.7 6.5 - 8.1 g/dL   Albumin 4.2 3.5 - 5.0 g/dL   AST 21 15 - 41 U/L   ALT 18 14 - 54 U/L   Alkaline Phosphatase 95 38 - 126 U/L   Total Bilirubin 0.4 0.3 - 1.2 mg/dL   GFR calc non Af Amer 56 (L) >60 mL/min   GFR calc Af Amer >60 >60 mL/min    Comment: (NOTE) The eGFR has been calculated using the CKD EPI equation. This calculation has not been validated in all clinical situations. eGFR's persistently <60 mL/min signify possible Chronic Kidney Disease.    Anion gap 12 5 - 15  Ethanol     Status: None   Collection Time: 10/01/15 12:23 PM  Result Value Ref Range   Alcohol, Ethyl (B) <5 <5 mg/dL    Comment:        LOWEST DETECTABLE LIMIT FOR SERUM ALCOHOL IS 5 mg/dL FOR MEDICAL PURPOSES ONLY   Salicylate level     Status: None   Collection Time: 10/01/15 12:23 PM  Result Value Ref Range   Salicylate Lvl <1.7 2.8 - 30.0 mg/dL  Acetaminophen level     Status: Abnormal   Collection Time: 10/01/15 12:23 PM  Result Value Ref Range   Acetaminophen (Tylenol), Serum <10 (L) 10 - 30 ug/mL    Comment:        THERAPEUTIC CONCENTRATIONS VARY SIGNIFICANTLY. A RANGE OF 10-30 ug/mL MAY BE AN EFFECTIVE CONCENTRATION FOR MANY PATIENTS. HOWEVER, SOME ARE BEST TREATED AT CONCENTRATIONS OUTSIDE THIS RANGE. ACETAMINOPHEN CONCENTRATIONS >150 ug/mL AT 4 HOURS AFTER INGESTION AND >50 ug/mL AT  12 HOURS AFTER INGESTION ARE OFTEN ASSOCIATED WITH TOXIC REACTIONS.   cbc     Status: Abnormal   Collection Time: 10/01/15 12:23 PM  Result Value Ref Range   WBC 9.3 4.0 - 10.5 K/uL   RBC 5.26 (H) 3.87 - 5.11 MIL/uL   Hemoglobin 15.0 12.0 - 15.0 g/dL   HCT 43.5 36.0 - 46.0 %   MCV 82.7 78.0 - 100.0 fL   MCH 28.5 26.0 - 34.0 pg   MCHC 34.5 30.0 - 36.0 g/dL   RDW 14.3 11.5 - 15.5 %   Platelets 266 150 - 400 K/uL  Hemoglobin A1c     Status: Abnormal   Collection Time: 10/01/15 12:23 PM  Result Value Ref Range   Hgb A1c MFr Bld 9.5 (H) 4.8 - 5.6 %    Comment: (NOTE)         Pre-diabetes: 5.7 - 6.4         Diabetes: >6.4         Glycemic control for adults with diabetes: <7.0    Mean Plasma Glucose 226 mg/dL    Comment: (NOTE) Performed At: [facility] [address] [place], [place] [id number] Lindon Romp MD QZ:[id number]   POC CBG, ED     Status: Abnormal   Collection Time: 10/01/15  8:13 PM  Result Value Ref Range   Glucose-Capillary 223 (H) 65 - 99 mg/dL   Comment 1 Notify RN    Comment 2 Document in Chart   POC CBG, ED     Status: Abnormal   Collection Time: 10/01/15 11:38 PM  Result Value Ref Range   Glucose-Capillary 150 (H) 65 - 99 mg/dL  POC CBG, ED     Status: Abnormal   Collection Time: 10/02/15  8:18 AM  Result Value Ref Range   Glucose-Capillary 173 (H) 65 - 99 mg/dL  Comment 1 Notify RN   Urinalysis, Routine w reflex microscopic (not at [facility])     Status: Abnormal   Collection Time: 10/02/15 12:02 PM  Result Value Ref Range   Color, Urine YELLOW YELLOW   APPearance CLEAR CLEAR   Specific Gravity, Urine 1.010 1.005 - 1.030   pH 6.0 5.0 - 8.0   Glucose, UA NEGATIVE NEGATIVE mg/dL   Hgb urine dipstick NEGATIVE NEGATIVE   Bilirubin Urine NEGATIVE NEGATIVE   Ketones, ur NEGATIVE NEGATIVE mg/dL   Protein, ur NEGATIVE NEGATIVE mg/dL   Nitrite NEGATIVE NEGATIVE   Leukocytes, UA SMALL (A) NEGATIVE  Urine microscopic-add on     Status:  Abnormal   Collection Time: 10/02/15 12:02 PM  Result Value Ref Range   Squamous Epithelial / LPF 0-5 (A) NONE SEEN   WBC, UA 0-5 0 - 5 WBC/hpf   RBC / HPF NONE SEEN 0 - 5 RBC/hpf   Bacteria, UA RARE (A) NONE SEEN    Current Facility-Administered Medications  Medication Dose Route Frequency Provider Last Rate Last Dose  . apixaban (ELIQUIS) tablet 5 mg  5 mg Oral BID Lajean Saver, MD   5 mg at 10/02/15 1023  . ARIPiprazole (ABILIFY) tablet 15 mg  15 mg Oral Daily Lajean Saver, MD   15 mg at 10/02/15 1024  . atorvastatin (LIPITOR) tablet 40 mg  40 mg Oral Daily Lajean Saver, MD   40 mg at 10/02/15 1022  . [START ON 10/07/2015] Exenatide ER PEN 2 mg  2 mg Subcutaneous Q Philippa Sicks, MD      . furosemide (LASIX) tablet 20 mg  20 mg Oral Daily Lajean Saver, MD   20 mg at 10/02/15 1024  . insulin glargine (LANTUS) injection 40 Units  40 Units Subcutaneous BID Lajean Saver, MD   40 Units at 10/02/15 1024  . lisinopril (PRINIVIL,ZESTRIL) tablet 5 mg  5 mg Oral Daily Lajean Saver, MD   5 mg at 10/02/15 1023  . LORazepam (ATIVAN) tablet 1 mg  1 mg Oral Q8H PRN Corena Pilgrim, MD   1 mg at 10/02/15 1209  . magnesium oxide (MAG-OX) tablet 200 mg  200 mg Oral Daily Lajean Saver, MD   200 mg at 10/02/15 1023  . metoprolol tartrate (LOPRESSOR) tablet 100 mg  100 mg Oral BID Lajean Saver, MD   100 mg at 10/02/15 1022  . metoprolol tartrate (LOPRESSOR) tablet 25 mg  25 mg Oral BID Lajean Saver, MD   25 mg at 10/02/15 1022  . potassium chloride (K-DUR,KLOR-CON) CR tablet 10 mEq  10 mEq Oral Daily Lajean Saver, MD   10 mEq at 10/02/15 1022  . pregabalin (LYRICA) capsule 100 mg  100 mg Oral QHS Stevi Barrett, PA-C      . pregabalin (LYRICA) capsule 50 mg  50 mg Oral BID Stevi Barrett, PA-C   50 mg at 10/02/15 1023  . tiZANidine (ZANAFLEX) tablet 6 mg  6 mg Oral TID PRN Lajean Saver, MD   6 mg at 10/02/15 1208  . traMADol (ULTRAM) tablet 50 mg  50 mg Oral Q6H PRN Lajean Saver, MD   50 mg at 10/01/15 2341    Current Outpatient Prescriptions  Medication Sig Dispense Refill  . ALPRAZolam (XANAX) 1 MG tablet Take 1 tablet (1 mg total) by mouth 3 (three) times daily as needed for anxiety. 20 tablet 0  . apixaban (ELIQUIS) 5 MG TABS tablet Take 5 mg by mouth 2 (two) times daily.    . ARIPiprazole (  ABILIFY) 15 MG tablet Take 1 tablet (15 mg total) by mouth daily. 90 tablet 2  . atorvastatin (LIPITOR) 40 MG tablet Take 1 tablet (40 mg total) by mouth daily. 90 tablet 1  . Cholecalciferol (VITAMIN D3) 1000 UNITS CAPS Take 1,000 Units by mouth daily.    . Exenatide ER (BYDUREON) 2 MG PEN Inject the contents of one pen once per week 12 each 1  . fenofibrate (TRICOR) 145 MG tablet Take 1 tablet (145 mg total) by mouth daily. 90 tablet 1  . furosemide (LASIX) 20 MG tablet Take 20 mg by mouth daily.     Marland Kitchen ibuprofen (ADVIL,MOTRIN) 800 MG tablet Take 800 mg by mouth 3 (three) times daily as needed for moderate pain.     . Insulin Glargine (LANTUS SOLOSTAR) 100 UNIT/ML Solostar Pen Inject 60 Units into the skin at bedtime. (Patient taking differently: Inject 40 Units into the skin 2 (two) times daily. ) 15 mL 0  . lidocaine (LIDODERM) 5 % Place 1 patch onto the skin daily. 12 hours on 12 hours off    . lisinopril (PRINIVIL,ZESTRIL) 5 MG tablet Take 5 mg by mouth daily.    . magnesium 30 MG tablet Take 250 mg by mouth daily.    . metoprolol (LOPRESSOR) 100 MG tablet Take 1 tablet (100 mg total) by mouth 2 (two) times daily. 60 tablet 5  . metoprolol tartrate (LOPRESSOR) 25 MG tablet Take 1 tablet (25 mg total) by mouth 2 (two) times daily. 180 tablet 6  . oxyCODONE-acetaminophen (PERCOCET) 10-325 MG tablet TAKE ONE TABLET BY MOUTH FOUR TIMES A DAY AS NEEDED FOR 30 DAYS  0  . potassium chloride (K-DUR,KLOR-CON) 10 MEQ tablet Take 10 meq once daily  0  . pregabalin (LYRICA) 50 MG capsule 1 pill morning and afternoon, 2 pills before bed (Patient taking differently: Take 50-100 mg by mouth 2 (two) times daily. 1 pill  morning and afternoon, 2 pills before bed) 360 capsule 1  . rizatriptan (MAXALT-MLT) 10 MG disintegrating tablet DISSOLVE 1 TABLET UNDER TONGUE IMMEDIATELY, MAY REPEAT DOSE IN 1 HOUR IF NEEDED- MAX OF 3 TABLETS IN 24 HOURS 10 tablet 2  . SUMAtriptan (IMITREX) 100 MG tablet Take 1 tablet (100 mg total) by mouth See admin instructions. 9 tablet 2  . temazepam (RESTORIL) 15 MG capsule Take 30 mg by mouth at bedtime as needed for sleep.     . tizanidine (ZANAFLEX) 6 MG capsule Take 1 capsule (6 mg total) by mouth 3 (three) times daily as needed for muscle spasms. 90 capsule 2  . traMADol (ULTRAM) 50 MG tablet Take 1 tablet (50 mg total) by mouth every 6 (six) hours as needed. for pain 180 tablet 2  . Blood Glucose Monitoring Suppl (FREESTYLE LITE) DEVI Use to check blood sugar tid. DX E11.22 1 each 0  . glucose blood (FREESTYLE LITE) test strip Test tid. DX E11.22 100 each 5  . insulin lispro (HUMALOG KWIKPEN) 100 UNIT/ML KiwkPen 20 units before meals (Patient not taking: Reported on 10/01/2015) 30 mL 1  . Insulin Pen Needle (SURE COMFORT PEN NEEDLES) 32G X 4 MM MISC Use with insulin pens as directed. Dx E11.9 300 each 11  . Lancets (FREESTYLE) lancets Test tid. DX E11.22 100 each 5  . lidocaine (XYLOCAINE) 2 % solution Use as directed 20 mLs in the mouth or throat as needed for mouth pain. (Patient not taking: Reported on 10/01/2015) 100 mL 0  . lisinopril (PRINIVIL,ZESTRIL) 10 MG tablet Take 1  tablet (10 mg total) by mouth daily. (Patient not taking: Reported on 10/01/2015) 90 tablet 1  . zaleplon (SONATA) 10 MG capsule 2  qhs (Patient not taking: Reported on 10/01/2015) 60 capsule 4    Musculoskeletal: Strength & Muscle Tone: within normal limits Gait & Station: normal Patient leans: N/A  Psychiatric Specialty Exam: Review of Systems  Constitutional: Negative.   HENT: Negative.   Eyes: Negative.   Respiratory: Negative.   Cardiovascular: Negative.   Gastrointestinal: Negative.   Genitourinary:  Negative.   Musculoskeletal: Negative.   Skin: Negative.   Neurological: Negative.   Endo/Heme/Allergies: Negative.   Medically stable at this time, see documented PMH  Blood pressure 149/95, pulse 80, temperature 97.7 F (36.5 C), temperature source Oral, resp. rate 16, SpO2 95 %.There is no weight on file to calculate BMI.  General Appearance: Casual and Fairly Groomed  Engineer, water::  Good  Speech:  Clear and Coherent and Pressured  Volume:  Normal  Mood:  Angry, Anxious and Irritable  Affect:  Congruent  Thought Process:  Coherent, Goal Directed and Intact  Orientation:  Full (Time, Place, and Person)  Thought Content:  WDL  Suicidal Thoughts:  Yes.  with intent/plan  Homicidal Thoughts:  No  Memory:  Immediate;   Good Recent;   Good Remote;   Good  Judgement:  Good  Insight:  Good  Psychomotor Activity:  Psychomotor Retardation  Concentration:  Good  Recall:  NA  Fund of Knowledge:Good  Language: Good  Akathisia:  NA  Handed:  Right  AIMS (if indicated):     Assets:  Desire for Improvement  ADL's:  Intact  Cognition: WNL  Sleep:      Treatment Plan Summary: Daily contact with patient to assess and evaluate symptoms and progress in treatment and Medication management  Disposition:  Patient has been accepted for admission and we will be seeking placement at any facility with available bed.   We have resumed her home medications.  We have ordered Chest Xray, EKG, and UA for preadmission test.    Delfin Gant, NP   PMHNP-BC 10/02/2015 3:43 PM Patient seen face-to-face for psychiatric evaluation, chart reviewed and case discussed with the physician extender and developed treatment plan. Reviewed the information documented and agree with the treatment plan. Corena Pilgrim, MD

## 2015-10-02 NOTE — ED Notes (Addendum)
Pt states she cantt breath. When assessed she does not appear to be in distress, no dyspnea assessed, pt is 98% on room air, chest expansion symmetrical, breathing at baseline from beginning of shift. Pt asked if she is on a CPAP at home and replied yes, when she wears it, at least 2 times per week.

## 2015-10-03 DIAGNOSIS — R45851 Suicidal ideations: Secondary | ICD-10-CM | POA: Diagnosis present

## 2015-10-03 DIAGNOSIS — F3112 Bipolar disorder, current episode manic without psychotic features, moderate: Secondary | ICD-10-CM | POA: Diagnosis not present

## 2015-10-03 DIAGNOSIS — F339 Major depressive disorder, recurrent, unspecified: Secondary | ICD-10-CM | POA: Insufficient documentation

## 2015-10-03 DIAGNOSIS — Z91041 Radiographic dye allergy status: Secondary | ICD-10-CM | POA: Diagnosis not present

## 2015-10-03 DIAGNOSIS — F329 Major depressive disorder, single episode, unspecified: Secondary | ICD-10-CM | POA: Insufficient documentation

## 2015-10-03 DIAGNOSIS — Z7901 Long term (current) use of anticoagulants: Secondary | ICD-10-CM | POA: Diagnosis not present

## 2015-10-03 DIAGNOSIS — I509 Heart failure, unspecified: Secondary | ICD-10-CM | POA: Diagnosis present

## 2015-10-03 DIAGNOSIS — E785 Hyperlipidemia, unspecified: Secondary | ICD-10-CM | POA: Diagnosis present

## 2015-10-03 DIAGNOSIS — I10 Essential (primary) hypertension: Secondary | ICD-10-CM | POA: Diagnosis not present

## 2015-10-03 DIAGNOSIS — Z79899 Other long term (current) drug therapy: Secondary | ICD-10-CM | POA: Diagnosis not present

## 2015-10-03 DIAGNOSIS — N289 Disorder of kidney and ureter, unspecified: Secondary | ICD-10-CM | POA: Diagnosis not present

## 2015-10-03 DIAGNOSIS — Z794 Long term (current) use of insulin: Secondary | ICD-10-CM | POA: Diagnosis not present

## 2015-10-03 DIAGNOSIS — I4891 Unspecified atrial fibrillation: Secondary | ICD-10-CM | POA: Diagnosis present

## 2015-10-03 DIAGNOSIS — I110 Hypertensive heart disease with heart failure: Secondary | ICD-10-CM | POA: Diagnosis present

## 2015-10-03 DIAGNOSIS — F419 Anxiety disorder, unspecified: Secondary | ICD-10-CM | POA: Diagnosis present

## 2015-10-03 DIAGNOSIS — E119 Type 2 diabetes mellitus without complications: Secondary | ICD-10-CM | POA: Diagnosis not present

## 2015-10-03 DIAGNOSIS — R0602 Shortness of breath: Secondary | ICD-10-CM | POA: Diagnosis not present

## 2015-10-03 DIAGNOSIS — F3130 Bipolar disorder, current episode depressed, mild or moderate severity, unspecified: Secondary | ICD-10-CM | POA: Diagnosis not present

## 2015-10-03 HISTORY — DX: Major depressive disorder, single episode, unspecified: F32.9

## 2015-10-03 LAB — CBG MONITORING, ED: Glucose-Capillary: 111 mg/dL — ABNORMAL HIGH (ref 65–99)

## 2015-10-03 NOTE — Progress Notes (Addendum)
Pt was told she is being discharged this am. Pt just phoned her husband to come and get her. (9:30am ) pt stated she does not feel SI and feels her bipolar episode is now under control. Pt FSBS earlier this am was 111 prior to getting her insulin. Pt was assisted OOB to the BR. Pt appears excited to be going home. Pt inquired if  has new RX for new psch meds. NP made aware. (9:50am ) Pt will go to [employer]. Phoned [phone] to give report. Per nurse , "we take report after transportation is there and the pt has left. " Phoned [place]. (10:10am )Report to [place]. (10:30am )

## 2015-10-03 NOTE — BH Assessment (Signed)
[facility] Assessment Progress Note  Per Corena Pilgrim, MD, this pt requires psychiatric hospitalization at a facility that provides specialty services for geriatric patients at this time.  At 08:58 [facility] calls from [facility]  Pt has been accepted to their facility by Dr Launa Grill to the [facility], Rm 141.  Dr Darleene Cleaver concurs with this decision.  This pt is currently under voluntary status, and she agrees to transfer.  Pt's nurse, Marcie Bal, has been notified, and agrees to call report to [phone] as pt is being prepared for departure.  Pt is to be transported via Stacey Drain, [facility] [phone]

## 2015-10-04 DIAGNOSIS — F319 Bipolar disorder, unspecified: Secondary | ICD-10-CM | POA: Insufficient documentation

## 2015-10-04 DIAGNOSIS — F3112 Bipolar disorder, current episode manic without psychotic features, moderate: Secondary | ICD-10-CM | POA: Insufficient documentation

## 2015-10-04 DIAGNOSIS — I509 Heart failure, unspecified: Secondary | ICD-10-CM | POA: Insufficient documentation

## 2015-10-08 ENCOUNTER — Telehealth: Payer: Self-pay | Admitting: Endocrinology

## 2015-10-08 DIAGNOSIS — M47816 Spondylosis without myelopathy or radiculopathy, lumbar region: Secondary | ICD-10-CM | POA: Diagnosis not present

## 2015-10-08 DIAGNOSIS — M5136 Other intervertebral disc degeneration, lumbar region: Secondary | ICD-10-CM | POA: Diagnosis not present

## 2015-10-08 DIAGNOSIS — G894 Chronic pain syndrome: Secondary | ICD-10-CM | POA: Diagnosis not present

## 2015-10-08 DIAGNOSIS — F329 Major depressive disorder, single episode, unspecified: Secondary | ICD-10-CM | POA: Diagnosis not present

## 2015-10-08 DIAGNOSIS — M488X6 Other specified spondylopathies, lumbar region: Secondary | ICD-10-CM | POA: Diagnosis not present

## 2015-10-08 DIAGNOSIS — M5387 Other specified dorsopathies, lumbosacral region: Secondary | ICD-10-CM | POA: Diagnosis not present

## 2015-10-08 NOTE — Telephone Encounter (Signed)
BS after dinner 4/30 216 5/1 270 5/2 261 5/3 271 5/4 280 5/5 279 5/6 171

## 2015-10-08 NOTE — Telephone Encounter (Signed)
What is her blood sugar before breakfast? Need to know current insulin doses

## 2015-10-08 NOTE — Telephone Encounter (Signed)
Please read and advise

## 2015-10-09 NOTE — Telephone Encounter (Signed)
Message left for patient to call back with information.

## 2015-10-10 NOTE — Telephone Encounter (Signed)
Please find out her blood sugar levels

## 2015-10-11 NOTE — Telephone Encounter (Signed)
I left a message for her to return call with blood sugar readings.

## 2015-10-12 ENCOUNTER — Encounter: Payer: Self-pay | Admitting: Endocrinology

## 2015-10-12 ENCOUNTER — Telehealth: Payer: Self-pay | Admitting: Cardiovascular Disease

## 2015-10-12 ENCOUNTER — Other Ambulatory Visit: Payer: Self-pay | Admitting: *Deleted

## 2015-10-12 ENCOUNTER — Other Ambulatory Visit: Payer: Self-pay

## 2015-10-12 ENCOUNTER — Ambulatory Visit (INDEPENDENT_AMBULATORY_CARE_PROVIDER_SITE_OTHER): Payer: Medicare Other | Admitting: Endocrinology

## 2015-10-12 ENCOUNTER — Telehealth: Payer: Self-pay | Admitting: Endocrinology

## 2015-10-12 VITALS — BP 126/84 | HR 74 | Temp 98.5°F | Resp 16 | Ht 62.0 in | Wt 196.6 lb

## 2015-10-12 DIAGNOSIS — E1165 Type 2 diabetes mellitus with hyperglycemia: Secondary | ICD-10-CM | POA: Diagnosis not present

## 2015-10-12 DIAGNOSIS — Z794 Long term (current) use of insulin: Secondary | ICD-10-CM

## 2015-10-12 MED ORDER — LIDOCAINE 5 % EX PTCH: 1.0000 | MEDICATED_PATCH | CUTANEOUS | Status: DC

## 2015-10-12 MED ORDER — METFORMIN HCL ER 500 MG PO TB24: ORAL_TABLET | ORAL | Status: DC

## 2015-10-12 NOTE — Telephone Encounter (Signed)
Left message to return call 

## 2015-10-12 NOTE — Telephone Encounter (Signed)
Amber Stephenson called in regards to checking on her dosage for Metoprolol

## 2015-10-12 NOTE — Progress Notes (Signed)
Patient ID: Amber Stephenson, female   DOB: Jul 20, 1945, 70 y.o.   MRN: [id number]  .          Reason for Appointment: Follow-up for Type 2 Diabetes  Referring physician: Wendi Snipes  History of Present Illness:          Date of diagnosis of type 2 diabetes mellitus:   2007       Background history:   She was diagnosed to have diabetes when she was having weakness and fainting episodes.  She thinks she was treated with metformin for quite some time but not clear if she took anything else  She was started on Lantus 5 years ago probably because of poor control in [place] She thinks however that even with taking Lantus and metformin her blood sugars were usually high after meals  Her A1c has generally been consistently above 7 even with metformin and Lantus and prior to moving to [place] it was about 8%  Recent history:   INSULIN regimen is:   Lantus 40-bid units daily, Humalog 0 units before meals 3 times a day Non-insulin hypoglycemic drugs the patient is taking are: Bydureon 2 mg weekly since 3/17  Her last A1c was 9.5 % in 5/17, similar to previous level of 9%  Current management, blood sugar patterns and problems identified:  She has been on Bydureon and basal bolus insulin  She had a recent hospitalization because of psychiatric problems and blood sugars were significantly higher before and during the hospitalization  She was not restarted on Humalog after she got home, was not instructed on this  She has done only readings once after supper and only 3 times in the morning since discharge  No side effects from Bydureon  Currently not on metformin  Side effects from medications have been: None  Compliance with the medical regimen: Fair Hypoglycemia:   none  Glucose monitoring:  done 1 times a day         Glucometer:  FreeStyle .      Blood Glucose readings recently  FASTING 96-143 Bedtime 203  Self-care: The diet that the patient has been following is: tries  to limit drinks containing sugar .     Typical meal intake: Breakfast is Granola cereal usually.  Usually having salad at lunch, mixed meal at dinnertime.  Snacks are with meat, yogurt, cottage cheese               Dietician visit, most recent: 3/17               Exercise:  none  Weight history: Her maximum weight previously has been 221, she lost down to 179 with dietary changes  Wt Readings from Last 3 Encounters:  10/12/15 196 lb 9.6 oz (89.177 kg)  09/25/15 198 lb 3.2 oz (89.903 kg)  08/31/15 194 lb 12.8 oz (88.361 kg)    Glycemic control:   Lab Results  Component Value Date   HGBA1C 9.5* 10/01/2015   HGBA1C 9.0* 05/30/2015   HGBA1C 8.0 03/30/2015   Lab Results  Component Value Date   MICROALBUR 1.9 07/23/2015   LDLCALC 71 01/04/2015   CREATININE 1.01* 10/01/2015    No visits with results within 1 Week(s) from this visit. Latest known visit with results is:  Admission on 10/01/2015, Discharged on 10/03/2015  Component Date Value Ref Range Status  . Sodium 10/01/2015 136  135 - 145 mmol/L Final  . Potassium 10/01/2015 4.6  3.5 - 5.1 mmol/L Final  . Chloride  10/01/2015 103  101 - 111 mmol/L Final  . CO2 10/01/2015 21* 22 - 32 mmol/L Final  . Glucose, Bld 10/01/2015 320* 65 - 99 mg/dL Final  . BUN 10/01/2015 25* 6 - 20 mg/dL Final  . Creatinine, Ser 10/01/2015 1.01* 0.44 - 1.00 mg/dL Final  . Calcium 10/01/2015 10.3  8.9 - 10.3 mg/dL Final  . Total Protein 10/01/2015 7.7  6.5 - 8.1 g/dL Final  . Albumin 10/01/2015 4.2  3.5 - 5.0 g/dL Final  . AST 10/01/2015 21  15 - 41 U/L Final  . ALT 10/01/2015 18  14 - 54 U/L Final  . Alkaline Phosphatase 10/01/2015 95  38 - 126 U/L Final  . Total Bilirubin 10/01/2015 0.4  0.3 - 1.2 mg/dL Final  . GFR calc non Af Amer 10/01/2015 56* >60 mL/min Final  . GFR calc Af Amer 10/01/2015 >60  >60 mL/min Final   Comment: (NOTE) The eGFR has been calculated using the CKD EPI equation. This calculation has not been validated in all  clinical situations. eGFR's persistently <60 mL/min signify possible Chronic Kidney Disease.   . Anion gap 10/01/2015 12  5 - 15 Final  . Alcohol, Ethyl (B) 10/01/2015 <5  <5 mg/dL Final   Comment:        LOWEST DETECTABLE LIMIT FOR SERUM ALCOHOL IS 5 mg/dL FOR MEDICAL PURPOSES ONLY   . Salicylate Lvl 42/35/3614 <4.0  2.8 - 30.0 mg/dL Final  . Acetaminophen (Tylenol), Serum 10/01/2015 <10* 10 - 30 ug/mL Final   Comment:        THERAPEUTIC CONCENTRATIONS VARY SIGNIFICANTLY. A RANGE OF 10-30 ug/mL MAY BE AN EFFECTIVE CONCENTRATION FOR MANY PATIENTS. HOWEVER, SOME ARE BEST TREATED AT CONCENTRATIONS OUTSIDE THIS RANGE. ACETAMINOPHEN CONCENTRATIONS >150 ug/mL AT 4 HOURS AFTER INGESTION AND >50 ug/mL AT 12 HOURS AFTER INGESTION ARE OFTEN ASSOCIATED WITH TOXIC REACTIONS.   . WBC 10/01/2015 9.3  4.0 - 10.5 K/uL Final  . RBC 10/01/2015 5.26* 3.87 - 5.11 MIL/uL Final  . Hemoglobin 10/01/2015 15.0  12.0 - 15.0 g/dL Final  . HCT 10/01/2015 43.5  36.0 - 46.0 % Final  . MCV 10/01/2015 82.7  78.0 - 100.0 fL Final  . MCH 10/01/2015 28.5  26.0 - 34.0 pg Final  . MCHC 10/01/2015 34.5  30.0 - 36.0 g/dL Final  . RDW 10/01/2015 14.3  11.5 - 15.5 % Final  . Platelets 10/01/2015 266  150 - 400 K/uL Final  . Opiates 10/01/2015 NONE DETECTED  NONE DETECTED Final  . Cocaine 10/01/2015 NONE DETECTED  NONE DETECTED Final  . Benzodiazepines 10/01/2015 POSITIVE* NONE DETECTED Final  . Amphetamines 10/01/2015 NONE DETECTED  NONE DETECTED Final  . Tetrahydrocannabinol 10/01/2015 NONE DETECTED  NONE DETECTED Final  . Barbiturates 10/01/2015 NONE DETECTED  NONE DETECTED Final   Comment:        DRUG SCREEN FOR MEDICAL PURPOSES ONLY.  IF CONFIRMATION IS NEEDED FOR ANY PURPOSE, NOTIFY LAB WITHIN 5 DAYS.        LOWEST DETECTABLE LIMITS FOR URINE DRUG SCREEN Drug Class       Cutoff (ng/mL) Amphetamine      1000 Barbiturate      200 Benzodiazepine   431 Tricyclics       540 Opiates           300 Cocaine          300 THC              50   . Hgb A1c MFr Bld  10/01/2015 9.5* 4.8 - 5.6 % Final   Comment: (NOTE)         Pre-diabetes: 5.7 - 6.4         Diabetes: >6.4         Glycemic control for adults with diabetes: <7.0   . Mean Plasma Glucose 10/01/2015 226   Final   Comment: (NOTE) Performed At: [facility] [place], [place] [id number] Lindon Romp MD NO:[id number]   . Glucose-Capillary 10/01/2015 223* 65 - 99 mg/dL Final  . Comment 1 10/01/2015 Notify RN   Final  . Comment 2 10/01/2015 Document in Chart   Final  . Glucose-Capillary 10/01/2015 150* 65 - 99 mg/dL Final  . Glucose-Capillary 10/02/2015 173* 65 - 99 mg/dL Final  . Comment 1 10/02/2015 Notify RN   Final  . Color, Urine 10/02/2015 YELLOW  YELLOW Final  . APPearance 10/02/2015 CLEAR  CLEAR Final  . Specific Gravity, Urine 10/02/2015 1.010  1.005 - 1.030 Final  . pH 10/02/2015 6.0  5.0 - 8.0 Final  . Glucose, UA 10/02/2015 NEGATIVE  NEGATIVE mg/dL Final  . Hgb urine dipstick 10/02/2015 NEGATIVE  NEGATIVE Final  . Bilirubin Urine 10/02/2015 NEGATIVE  NEGATIVE Final  . Ketones, ur 10/02/2015 NEGATIVE  NEGATIVE mg/dL Final  . Protein, ur 10/02/2015 NEGATIVE  NEGATIVE mg/dL Final  . Nitrite 10/02/2015 NEGATIVE  NEGATIVE Final  . Leukocytes, UA 10/02/2015 SMALL* NEGATIVE Final  . Squamous Epithelial / LPF 10/02/2015 0-5* NONE SEEN Final  . WBC, UA 10/02/2015 0-5  0 - 5 WBC/hpf Final  . RBC / HPF 10/02/2015 NONE SEEN  0 - 5 RBC/hpf Final  . Bacteria, UA 10/02/2015 RARE* NONE SEEN Final  . Glucose-Capillary 10/03/2015 111* 65 - 99 mg/dL Final  . Comment 1 10/03/2015 Notify RN   Final       Medication List       This list is accurate as of: 10/12/15 11:59 PM.  Always use your most recent med list.               ALPRAZolam 1 MG tablet  Commonly known as:  XANAX  Take 1 tablet (1 mg total) by mouth 3 (three) times daily as needed for anxiety.     ARIPiprazole 15 MG tablet   Commonly known as:  ABILIFY  Take 1 tablet (15 mg total) by mouth daily.     atorvastatin 40 MG tablet  Commonly known as:  LIPITOR  Take 1 tablet (40 mg total) by mouth daily.     ELIQUIS 5 MG Tabs tablet  Generic drug:  apixaban  Take 5 mg by mouth 2 (two) times daily.     Exenatide ER 2 MG Pen  Commonly known as:  BYDUREON  Inject the contents of one pen once per week     fenofibrate 145 MG tablet  Commonly known as:  TRICOR  Take 1 tablet (145 mg total) by mouth daily.     freestyle lancets  Test tid. DX E11.22     FREESTYLE LITE Devi  Use to check blood sugar tid. DX E11.22     furosemide 20 MG tablet  Commonly known as:  LASIX  Take 20 mg by mouth daily.     glucose blood test strip  Commonly known as:  FREESTYLE LITE  Test tid. DX E11.22     ibuprofen 800 MG tablet  Commonly known as:  ADVIL,MOTRIN  Take 800 mg by mouth 3 (three) times daily as needed for  moderate pain. Reported on 10/12/2015     Insulin Glargine 100 UNIT/ML Solostar Pen  Commonly known as:  LANTUS SOLOSTAR  Inject 60 Units into the skin at bedtime.     insulin lispro 100 UNIT/ML KiwkPen  Commonly known as:  HUMALOG KWIKPEN  20 units before meals     Insulin Pen Needle 32G X 4 MM Misc  Commonly known as:  SURE COMFORT PEN NEEDLES  Use with insulin pens as directed. Dx E11.9     lidocaine 2 % solution  Commonly known as:  XYLOCAINE  Use as directed 20 mLs in the mouth or throat as needed for mouth pain.     lidocaine 5 %  Commonly known as:  LIDODERM  Place 1 patch onto the skin daily. 12 hours on 12 hours off     lisinopril 5 MG tablet  Commonly known as:  PRINIVIL,ZESTRIL  Take 5 mg by mouth daily.     lisinopril 10 MG tablet  Commonly known as:  PRINIVIL,ZESTRIL  Take 1 tablet (10 mg total) by mouth daily.     magnesium 30 MG tablet  Take 250 mg by mouth daily.     metFORMIN 500 MG 24 hr tablet  Commonly known as:  GLUCOPHAGE XR  Take 2 tablets in the am and 2 tablets in  the pm     metoprolol 100 MG tablet  Commonly known as:  LOPRESSOR  Take 1 tablet (100 mg total) by mouth 2 (two) times daily.     metoprolol tartrate 25 MG tablet  Commonly known as:  LOPRESSOR  Take 1 tablet (25 mg total) by mouth 2 (two) times daily.     oxyCODONE-acetaminophen 10-325 MG tablet  Commonly known as:  PERCOCET  TAKE ONE TABLET BY MOUTH FOUR TIMES A DAY AS NEEDED FOR 30 DAYS     potassium chloride 10 MEQ tablet  Commonly known as:  K-DUR,KLOR-CON  Take 10 meq once daily     pregabalin 50 MG capsule  Commonly known as:  LYRICA  1 pill morning and afternoon, 2 pills before bed     rizatriptan 10 MG disintegrating tablet  Commonly known as:  MAXALT-MLT  DISSOLVE 1 TABLET UNDER TONGUE IMMEDIATELY, MAY REPEAT DOSE IN 1 HOUR IF NEEDED- MAX OF 3 TABLETS IN 24 HOURS     SUMAtriptan 100 MG tablet  Commonly known as:  IMITREX  Take 1 tablet (100 mg total) by mouth See admin instructions.     temazepam 15 MG capsule  Commonly known as:  RESTORIL  Take 30 mg by mouth at bedtime as needed for sleep.     tizanidine 6 MG capsule  Commonly known as:  ZANAFLEX  Take 1 capsule (6 mg total) by mouth 3 (three) times daily as needed for muscle spasms.     traMADol 50 MG tablet  Commonly known as:  ULTRAM  Take 1 tablet (50 mg total) by mouth every 6 (six) hours as needed. for pain     Vitamin D3 1000 units Caps  Take 1,000 Units by mouth daily.     zaleplon 10 MG capsule  Commonly known as:  SONATA  2  qhs        Allergies:  Allergies  Allergen Reactions  . Ivp Dye [Iodinated Diagnostic Agents] Swelling    Throat closes    Past Medical History  Diagnosis Date  . Diabetes mellitus without complication ([place])   . Hyperlipidemia   . Hypertension   . Bipolar affective (  [place])   . Atrial fibrillation ([place])   . Chronic back pain   . Pain management   . CHF (congestive heart failure) ([place])   . Renal insufficiency     Past Surgical History  Procedure  Laterality Date  . Thigh surgery    . Shoulder surgery Right   . Breast reduction surgery      Family History  Problem Relation Age of Onset  . Diabetes Mother   . Heart disease Mother   . Heart disease Brother   . Heart disease Sister     CABG  . Mental illness Brother   . Diabetes Brother     Social History:  reports that she has never smoked. She has never used smokeless tobacco. She reports that she does not drink alcohol or use illicit drugs.    Review of Systems   She has had renal dysfunction previously  Lab Results  Component Value Date   CREATININE 1.01* 10/01/2015   BUN 25* 10/01/2015   NA 136 10/01/2015   K 4.6 10/01/2015   CL 103 10/01/2015   CO2 21* 10/01/2015     Lipid history: Has been on fenofibrate and Lipitor forManagement   quite some time    Lab Results  Component Value Date   CHOL 163 01/04/2015   HDL 42 01/04/2015   LDLCALC 71 01/04/2015   TRIG 249* 01/04/2015   CHOLHDL 3.9 01/04/2015           Hypertension: Treated with lisinopril and metoprolol.  She is also on high dose metoprolol for her atrial fibrillation  Most recent eye exam was in [place] in 2016  Most recent foot exam:2/17  Review of Systems    LABS:    Physical Examination:  BP 126/84 mmHg  Pulse 74  Temp(Src) 98.5 F (36.9 C)  Resp 16  Ht 5' 2"  (1.575 m)  Wt 196 lb 9.6 oz (89.177 kg)  BMI 35.95 kg/m2  SpO2 94%     ASSESSMENT:  Diabetes type 2, uncontrolled with obesity and BMI 37 See history of present illness for detailed discussion of current diabetes management, blood sugar patterns and problems identified She is on a basal bolus insulin regimen along with Bydureon  She has had poor control with A1c staying over 9% However blood sugars are not is high and even without taking any mealtime coverage she is getting readings around 200 only after eating Fasting readings are fairly good Currently not exercising but she may be able to do more Still  is insulin resistant with taking 80 units of Lantus a day  RENAL dysfunction: This has resolved and she can take metformin again   PLAN:    She will start monitoring blood sugars more consistently after meals  Restart metformin ER 2000 mg a day  Discussed in detail how the V-go pump may work and she is interested in trying this, insurance paperwork faxed  Most likely she can try the 40 unit basal with using 8-10 units mealtime coverage  Meanwhile she will start at least 10 units Humalog before meals  Continue Bydureon weekly  Increase exercise as tolerated  Consistent diet  Reduce Lantus by 2 units twice a day for now  Patient Instructions  Humalog 10 units before meals  Lantus 38 units twice daily  Check blood sugars on waking up 3-4  times a week Also check blood sugars about 2 hours after a meal and do this after different meals by rotation  Recommended blood sugar  levels on waking up is 90-130 and about 2 hours after meal is 130-160  Please bring your blood sugar monitor to each visit, thank you    Counseling time on subjects discussed above is over 50% of today's 25 minute visit  Claudina Oliphant 10/14/2015, 6:15 PM   Note: This office note was prepared with Dragon voice recognition system technology. Any transcriptional errors that result from this process are unintentional.

## 2015-10-12 NOTE — Patient Instructions (Addendum)
Humalog 10 units before meals  Lantus 38 units twice daily  Check blood sugars on waking up 3-4  times a week Also check blood sugars about 2 hours after a meal and do this after different meals by rotation  Recommended blood sugar levels on waking up is 90-130 and about 2 hours after meal is 130-160  Please bring your blood sugar monitor to each visit, thank you

## 2015-10-15 NOTE — Telephone Encounter (Signed)
Patient currently on Metoprolol tart 125mg  twice a day.  Stated that some of her other doctors were questioning such a high dose on this medication.  Informed patient that per last office note on 07/2015 - MD dictation does confirm this dose.  Advised to continue the same.  Patient already has 4 month follow up scheduled for 11/27/2015 here in [place].  She can discuss any further concerns at that time.  She verbalized understanding.

## 2015-10-15 NOTE — Telephone Encounter (Signed)
Amber Stephenson called back in regards to dosage of the Metoprolol  Please call [phone]

## 2015-10-16 ENCOUNTER — Ambulatory Visit (INDEPENDENT_AMBULATORY_CARE_PROVIDER_SITE_OTHER): Payer: Medicare Other | Admitting: Family Medicine

## 2015-10-16 ENCOUNTER — Encounter: Payer: Self-pay | Admitting: Family Medicine

## 2015-10-16 VITALS — BP 123/82 | HR 92 | Temp 97.1°F | Ht 62.0 in | Wt 199.4 lb

## 2015-10-16 DIAGNOSIS — M549 Dorsalgia, unspecified: Secondary | ICD-10-CM

## 2015-10-16 DIAGNOSIS — G8929 Other chronic pain: Secondary | ICD-10-CM | POA: Diagnosis not present

## 2015-10-16 DIAGNOSIS — E2839 Other primary ovarian failure: Secondary | ICD-10-CM

## 2015-10-16 DIAGNOSIS — F3112 Bipolar disorder, current episode manic without psychotic features, moderate: Secondary | ICD-10-CM

## 2015-10-16 MED ORDER — ALPRAZOLAM 1 MG PO TABS: 1.0000 mg | ORAL_TABLET | Freq: Two times a day (BID) | ORAL | Status: DC | PRN

## 2015-10-16 MED ORDER — PREGABALIN 50 MG PO CAPS: ORAL_CAPSULE | ORAL | Status: DC

## 2015-10-16 NOTE — Patient Instructions (Signed)
Great to see you!  I am very glad you are feeling better.   I have to keep the number os xanax low, Dr. Casimiro Needle will have to be the physician to decide if that is a good long term treatment or if he would like you to use something else.   We will help you get a dexa scan arranged

## 2015-10-16 NOTE — Progress Notes (Signed)
   HPI  Patient presents today here for hospital follow-up.  Patient was seen at [facility] on May 1 with suicidal thoughts.  She was admitted to the psychiatric unit at [facility]. She denies any suicidal thoughts currently. She does request additional Xanax, she did not realize that temazepam is also benzodiazepine.  She has been seen by psychiatry since her discharge that [facility], her personal psychiatrist cannot see her until next month.  She did not like how she felt like everyone mistreating her that she was addicted to medications. She does not feel that she overuses benzodiazepines. She understands that they are only when necessary medications She feels well otherwise.     PMH: Smoking status noted ROS: Per HPI  Objective: BP 123/82 mmHg  Pulse 92  Temp(Src) 97.1 F (36.2 C) (Oral)  Ht 5\' 2"  (1.575 m)  Wt 199 lb 6.4 oz (90.447 kg)  BMI 36.46 kg/m2 Gen: NAD, alert, cooperative with exam HEENT: NCAT CV: RRR, good S1/S2, no murmur Resp: CTABL, no wheezes, non-labored Ext: No edema, warm Neuro: Alert and oriented, No gross deficits  Psych: Pressured affect and speech, slightly agitated, denies suicidal thoughts  Assessment and plan:  # Bipolar disorder, suicidal thoughts Patient recently discharged from [facility] geriatric psych unit Denies suicidal thoughts today, feels mood is less pressured She has follow-up with her psychiatrist, she is arty been seen by psychiatry once since her discharge. I've refilled a small number of Xanax, I explained that her psychiatrist will have to be the primary prescriber of this medication and that if he does not feel it is necessary I will not be able to prescribe it any longer. She understands. Follow-up in 3 months unless needed sooner  healthcare maintenance Estrogen deficiency-DEXA scan ordered  Back pain On chronic opiates from pain management Refilled Lyrica      Orders Placed This Encounter    Procedures  . DG Bone Density    Standing Status: Future     Number of Occurrences:      Standing Expiration Date: 12/15/2016    Order Specific Question:  Reason for Exam (SYMPTOM  OR DIAGNOSIS REQUIRED)    Answer:  screening for osteoporosis    Order Specific Question:  Preferred imaging location?    Answer:  External    Meds ordered this encounter  Medications  . ALPRAZolam (XANAX) 1 MG tablet    Sig: Take 1 tablet (1 mg total) by mouth 2 (two) times daily as needed for anxiety.    Dispense:  15 tablet    Refill:  0    Laroy Apple, MD [facility] 10/16/2015, 3:17 PM

## 2015-10-17 ENCOUNTER — Other Ambulatory Visit: Payer: Self-pay | Admitting: Family Medicine

## 2015-10-18 NOTE — Telephone Encounter (Signed)
Last given #9 with 2 refills 06/05/2015. Dr.Bradshaw pt.

## 2015-10-22 ENCOUNTER — Encounter: Payer: Medicare Other | Attending: Endocrinology | Admitting: Nutrition

## 2015-10-22 DIAGNOSIS — E1165 Type 2 diabetes mellitus with hyperglycemia: Secondary | ICD-10-CM | POA: Diagnosis not present

## 2015-10-22 DIAGNOSIS — Z794 Long term (current) use of insulin: Secondary | ICD-10-CM

## 2015-10-22 NOTE — Progress Notes (Signed)
Pt. Reports that she has the Humalog, but has not started it.  She is taking 38u of Lantus twice daily: 9AM and 9:30PM  Did not bring her meter, and is not testing her blood sugars.   I showed her the V-go, and she says she did not hear from [facility] after signing the form, when she was seeing Dr. Dwyane Dee last week.  I phoned them,,and they said they contacted her and are waiting for a prior author. To be sent to [employer].  Michela Pitcher it was faxed last week to the office.    Discussed how the V-go works,and the need to test blood sugars 4X/day for the first week.  She would like to try this.  I will fill out the prior auth., and will call her with the cost.    This patient does not like to take insulin, and test blood sugars.  She has agreed to start the Humalog today with 10u before meals.

## 2015-10-22 NOTE — Patient Instructions (Signed)
Take 10u of Humalog 5 min. Before all meals Test blood sugars before breakfast and 2 hours after one meal each day.

## 2015-10-24 ENCOUNTER — Ambulatory Visit: Payer: Medicare Other | Attending: Family Medicine | Admitting: Physical Therapy

## 2015-10-24 ENCOUNTER — Encounter: Payer: Self-pay | Admitting: Physical Therapy

## 2015-10-24 ENCOUNTER — Telehealth: Payer: Self-pay | Admitting: Endocrinology

## 2015-10-24 DIAGNOSIS — M545 Low back pain, unspecified: Secondary | ICD-10-CM

## 2015-10-24 DIAGNOSIS — R293 Abnormal posture: Secondary | ICD-10-CM | POA: Diagnosis not present

## 2015-10-24 NOTE — Telephone Encounter (Signed)
I haven't heard anything from her insurance yet, please advise.

## 2015-10-24 NOTE — Therapy (Signed)
[facility] [place], [place], [address] Phone: [phone]   Fax:  [phone]  Physical Therapy Treatment  Patient Details  Name: Amber Stephenson MRN: [id number] Date of Birth: September 28, 1945 Referring Provider: Kenn File MD  Encounter Date: 10/24/2015      PT End of Session - 10/24/15 1255    Visit Number 8   Number of Visits 12   Date for PT Re-Evaluation 12/24/15   PT Start Time [id number]   PT Stop Time 1355   PT Time Calculation (min) 52 min   Activity Tolerance Patient tolerated treatment well   Behavior During Therapy [facility] for tasks assessed/performed      Past Medical History  Diagnosis Date  . Diabetes mellitus without complication ([place])   . Hyperlipidemia   . Hypertension   . Bipolar affective ([place])   . Atrial fibrillation ([place])   . Chronic back pain   . Pain management   . CHF (congestive heart failure) ([place])   . Renal insufficiency     Past Surgical History  Procedure Laterality Date  . Thigh surgery    . Shoulder surgery Right   . Breast reduction surgery      There were no vitals filed for this visit.      Subjective Assessment - 10/24/15 1255    Subjective Reports that therapy has overall helped her and she can complete activities around her home easier. Reports that for the last two days her back has really been hurting for unknown reason.   Limitations House hold activities   Patient Stated Goals Would like to decrease my pain for a better quality of life.   Currently in Pain? Yes   Pain Score 9    Pain Location Back   Pain Orientation Right;Lower   Pain Descriptors / Indicators Aching   Pain Type Chronic pain   Pain Onset More than a month ago            [facility] PT Assessment - 10/24/15 0001    Assessment   Medical Diagnosis Chronic low back pain.   Precautions   Precautions None   Restrictions   Weight Bearing Restrictions No                     [facility] Adult PT  Treatment/Exercise - 10/24/15 0001    Modalities   Modalities Electrical Stimulation;Moist Heat;Ultrasound   Moist Heat Therapy   Number Minutes Moist Heat 15 Minutes   Moist Heat Location Lumbar Spine   Electrical Stimulation   Electrical Stimulation Location R low back/ Upper Glute   Electrical Stimulation Action IFC   Electrical Stimulation Parameters 1-10 Hz x15 min   Electrical Stimulation Goals Pain;Tone   Ultrasound   Ultrasound Location R lumbar paraspinals/ Upper Glute   Ultrasound Parameters 1.5 w/cm2, 100%,1 mhz x10 min   Ultrasound Goals Pain   Manual Therapy   Manual Therapy Myofascial release   Myofascial Release MFR/TPR to R lumbar paraspinals/ Upper Glute in L sidelying to decrease tightness                  PT Short Term Goals - 07/05/15 1927    PT SHORT TERM GOAL #1   Title Ind with a HEP.   Time 2   Period Weeks   Status New           PT Long Term Goals - 07/05/15 1927    PT LONG TERM GOAL #1   Title  Perform ADL's with pain not > 3-4/10.   Time 6   Period Weeks   Status New   PT LONG TERM GOAL #2   Title Sit 30 minutes with pain not > 3-4/10.   Time 6   Period Weeks   Status New   PT LONG TERM GOAL #3   Title Transition from sit to stand with pain not > 4/10.   Time 6   Period Weeks   Status New               Plan - 10/24/15 1343    Clinical Impression Statement Patient arrived to clinic today with increased R low back tightness and pain. Patient wishes for recertification to be sent to MD to continue PT. Patient was very sensitive to palpation in R upper Glute region. Patient presented with increased R lumbar paraspinals tightness and tightness into R upper Glute where a trigger point was noted. Moderate release noted with manual therapy today. Normal modalites response noted following removal of the modalities.    Rehab Potential Good   PT Frequency 2x / week   PT Duration 6 weeks   PT Treatment/Interventions ADLs/Self  Care Home Management;Electrical Stimulation;Moist Heat;Therapeutic exercise;Therapeutic activities;Ultrasound;Patient/family education;Manual techniques   PT Next Visit Plan Continue as symptoms dictate per MPT POC. Recert for dry needling approval sent 09/27/2015. Recert for more visits requested by patient sent 10/24/2015   PT Home Exercise Plan HEP- SKTC, Figure 4 Piriformis stretch   Consulted and Agree with Plan of Care Patient      Patient will benefit from skilled therapeutic intervention in order to improve the following deficits and impairments:  Pain, Decreased activity tolerance, Decreased range of motion  Visit Diagnosis: Right-sided low back pain without sciatica - Plan: PT plan of care cert/re-cert  Abnormal posture - Plan: PT plan of care cert/re-cert     Problem List Patient Active Problem List   Diagnosis Date Noted  . Depression   . Encounter for preadmission testing   . Herpes genitalis in women 07/16/2015  . Genital lesion, female 07/12/2015  . Chronic anticoagulation 05/30/2015  . Family history of coronary artery disease in sister 05/30/2015  . Acute on chronic diastolic CHF (congestive heart failure), NYHA class 1 ([place]) 05/30/2015  . Chest pain with moderate risk of acute coronary syndrome 05/29/2015  . Pain in the chest   . Dyspnea   . Essential hypertension   . RLS (restless legs syndrome) 04/27/2015  . Fall 02/26/2015  . Back pain 02/26/2015  . Acute low back pain due to trauma 02/26/2015  . Lipoma 02/08/2015  . Bipolar disorder ([place]) 01/23/2015  . Insomnia 01/23/2015  . CKD stage 3 due to type 2 diabetes mellitus ([place]) 01/05/2015  . Chronic back pain 01/04/2015  . Permanent atrial fibrillation ([place]) 01/04/2015  . Pruritus 01/04/2015  . Diabetes mellitus with stage 2 chronic kidney disease ([place])   . Hyperlipidemia    Ahmed Prima, PTA 10/24/2015 5:38 PM Mali Applegate MPT [facility] [address] [place], [place], [address] Phone: [phone]   Fax:  [phone]  Name: Ocia Widen MRN: [id number] Date of Birth: 11-26-1945

## 2015-10-24 NOTE — Telephone Encounter (Signed)
She needs to call either the [employer] company or her insurance.  We'll see her 3-4 days after starting the pump

## 2015-10-24 NOTE — Telephone Encounter (Signed)
Pt said she was told that she needed to hear from the insurance company before she was to come in to see Dr. Dwyane Dee, she said she hasn't heard anything yet and wants to know if she needs to keep her appointment or not.

## 2015-10-24 NOTE — Telephone Encounter (Signed)
Noted, patient is aware and will call her insurance company. Her appointment for tomorrow was cancelled.

## 2015-10-25 ENCOUNTER — Ambulatory Visit: Payer: Self-pay | Admitting: Endocrinology

## 2015-10-31 ENCOUNTER — Other Ambulatory Visit: Payer: Self-pay | Admitting: *Deleted

## 2015-10-31 ENCOUNTER — Telehealth: Payer: Self-pay | Admitting: Endocrinology

## 2015-10-31 MED ORDER — METFORMIN HCL ER 500 MG PO TB24: ORAL_TABLET | ORAL | Status: DC

## 2015-10-31 NOTE — Telephone Encounter (Signed)
Pt calling to speak with rhonda regarding the vgo pump PA number is [phone]

## 2015-10-31 NOTE — Telephone Encounter (Signed)
[employer] only allows electronic prior authorizations to be done, this has to be done through cover my meds.  This will be done and sent to her insurance company again.

## 2015-11-01 ENCOUNTER — Encounter: Payer: Self-pay | Admitting: Physical Therapy

## 2015-11-05 ENCOUNTER — Ambulatory Visit (INDEPENDENT_AMBULATORY_CARE_PROVIDER_SITE_OTHER): Payer: Medicare Other | Admitting: Family Medicine

## 2015-11-05 ENCOUNTER — Encounter: Payer: Self-pay | Admitting: Family Medicine

## 2015-11-05 ENCOUNTER — Ambulatory Visit (INDEPENDENT_AMBULATORY_CARE_PROVIDER_SITE_OTHER): Payer: Medicare Other

## 2015-11-05 ENCOUNTER — Ambulatory Visit: Payer: Self-pay | Admitting: Endocrinology

## 2015-11-05 VITALS — BP 122/80 | HR 78 | Temp 96.8°F | Ht 62.0 in | Wt 192.0 lb

## 2015-11-05 DIAGNOSIS — R1012 Left upper quadrant pain: Secondary | ICD-10-CM

## 2015-11-05 DIAGNOSIS — R109 Unspecified abdominal pain: Secondary | ICD-10-CM

## 2015-11-05 DIAGNOSIS — K5900 Constipation, unspecified: Secondary | ICD-10-CM

## 2015-11-05 MED ORDER — POLYETHYLENE GLYCOL 3350 17 GM/SCOOP PO POWD: 17.0000 g | Freq: Every day | ORAL | Status: DC | PRN

## 2015-11-05 NOTE — Progress Notes (Signed)
BP 122/80 mmHg  Pulse 78  Temp(Src) 96.8 F (36 C) (Oral)  Ht 5\' 2"  (1.575 m)  Wt 192 lb (87.091 kg)  BMI 35.11 kg/m2   Subjective:    Patient ID: Amber Stephenson, female    DOB: 1945-08-10, 70 y.o.   MRN: [id number]  HPI: Amber Stephenson is a 70 y.o. female presenting on 11/05/2015 for Abdominal Pain   HPI Constipation and left sided abdominal pain. Patient has had left-sided abdominal pain and constipation that started about 5 or 6 days ago. She was constipated she said and she was taking MiraLAX 4 days ago and then had a bowel movement and has not taken it since. She still takes a stool softener. She has had a bowel movement everyday since then but they have all been hard and she's had to strain a lot to push them out. She denies any fevers or chills or blood in her stool. She denies any flank pain or pain radiating anywhere else. The pain she describes is a 6 out of 10.  Relevant past medical, surgical, family and social history reviewed and updated as indicated. Interim medical history since our last visit reviewed. Allergies and medications reviewed and updated.  Review of Systems  Constitutional: Negative for fever and chills.  HENT: Negative for congestion, ear discharge and ear pain.   Eyes: Negative for redness and visual disturbance.  Respiratory: Negative for chest tightness and shortness of breath.   Cardiovascular: Negative for chest pain and leg swelling.  Gastrointestinal: Positive for abdominal pain and constipation. Negative for nausea, vomiting and diarrhea.  Genitourinary: Negative for dysuria, urgency, frequency, hematuria, flank pain and difficulty urinating.  Musculoskeletal: Negative for back pain and gait problem.  Skin: Negative for rash.  Neurological: Negative for light-headedness and headaches.  Psychiatric/Behavioral: Negative for behavioral problems and agitation.  All other systems reviewed and are negative.   Per HPI unless specifically indicated  above     Medication List       This list is accurate as of: 11/05/15 12:30 PM.  Always use your most recent med list.               ALPRAZolam 1 MG tablet  Commonly known as:  XANAX  Take 1 tablet (1 mg total) by mouth 2 (two) times daily as needed for anxiety.     ARIPiprazole 15 MG tablet  Commonly known as:  ABILIFY  Take 1 tablet (15 mg total) by mouth daily.     atorvastatin 40 MG tablet  Commonly known as:  LIPITOR  Take 1 tablet (40 mg total) by mouth daily.     ELIQUIS 5 MG Tabs tablet  Generic drug:  apixaban  Take 5 mg by mouth 2 (two) times daily.     Exenatide ER 2 MG Pen  Commonly known as:  BYDUREON  Inject the contents of one pen once per week     fenofibrate 145 MG tablet  Commonly known as:  TRICOR  Take 1 tablet (145 mg total) by mouth daily.     freestyle lancets  Test tid. DX E11.22     FREESTYLE LITE Devi  Use to check blood sugar tid. DX E11.22     furosemide 20 MG tablet  Commonly known as:  LASIX  Take 20 mg by mouth daily.     glucose blood test strip  Commonly known as:  FREESTYLE LITE  Test tid. DX E11.22     ibuprofen 800 MG tablet  Commonly  known as:  ADVIL,MOTRIN  Take 800 mg by mouth 3 (three) times daily as needed for moderate pain. Reported on 10/12/2015     Insulin Glargine 100 UNIT/ML Solostar Pen  Commonly known as:  LANTUS SOLOSTAR  Inject 60 Units into the skin at bedtime.     insulin lispro 100 UNIT/ML KiwkPen  Commonly known as:  HUMALOG KWIKPEN  20 units before meals     Insulin Pen Needle 32G X 4 MM Misc  Commonly known as:  SURE COMFORT PEN NEEDLES  Use with insulin pens as directed. Dx E11.9     lidocaine 2 % solution  Commonly known as:  XYLOCAINE  Use as directed 20 mLs in the mouth or throat as needed for mouth pain.     lidocaine 5 %  Commonly known as:  LIDODERM  Place 1 patch onto the skin daily. 12 hours on 12 hours off     lisinopril 5 MG tablet  Commonly known as:  PRINIVIL,ZESTRIL  Take 5  mg by mouth daily.     lisinopril 10 MG tablet  Commonly known as:  PRINIVIL,ZESTRIL  Take 1 tablet (10 mg total) by mouth daily.     magnesium 30 MG tablet  Take 250 mg by mouth daily.     metFORMIN 500 MG 24 hr tablet  Commonly known as:  GLUCOPHAGE XR  Take 2 tablets in the am and 2 tablets in the pm     metoprolol 100 MG tablet  Commonly known as:  LOPRESSOR  Take 1 tablet (100 mg total) by mouth 2 (two) times daily.     metoprolol tartrate 25 MG tablet  Commonly known as:  LOPRESSOR  Take 1 tablet (25 mg total) by mouth 2 (two) times daily.     oxyCODONE-acetaminophen 10-325 MG tablet  Commonly known as:  PERCOCET  TAKE ONE TABLET BY MOUTH FOUR TIMES A DAY AS NEEDED FOR 30 DAYS     potassium chloride 10 MEQ tablet  Commonly known as:  K-DUR,KLOR-CON  Take 10 meq once daily     pregabalin 50 MG capsule  Commonly known as:  LYRICA  1 pill morning and afternoon, 2 pills before bed     rizatriptan 10 MG disintegrating tablet  Commonly known as:  MAXALT-MLT  DISSOLVE 1 TABLET UNDER TONGUE IMMEDIATELY, MAY REPEAT DOSE IN 1 HOUR IF NEEDED- MAX OF 3 TABLETS IN 24 HOURS     SUMAtriptan 100 MG tablet  Commonly known as:  IMITREX  TAKE 1 TABLET BY MOUTH AS DIRECTED BY DOCTOR     temazepam 15 MG capsule  Commonly known as:  RESTORIL  Take 30 mg by mouth at bedtime as needed for sleep.     tizanidine 6 MG capsule  Commonly known as:  ZANAFLEX  Take 1 capsule (6 mg total) by mouth 3 (three) times daily as needed for muscle spasms.     traMADol 50 MG tablet  Commonly known as:  ULTRAM  Take 1 tablet (50 mg total) by mouth every 6 (six) hours as needed. for pain     Vitamin D3 1000 units Caps  Take 1,000 Units by mouth daily.     zaleplon 10 MG capsule  Commonly known as:  SONATA  2  qhs           Objective:    BP 122/80 mmHg  Pulse 78  Temp(Src) 96.8 F (36 C) (Oral)  Ht 5\' 2"  (1.575 m)  Wt 192 lb (87.091 kg)  BMI 35.11 kg/m2  Wt Readings from Last 3  Encounters:  11/05/15 192 lb (87.091 kg)  10/16/15 199 lb 6.4 oz (90.447 kg)  10/12/15 196 lb 9.6 oz (89.177 kg)    Physical Exam  Constitutional: She is oriented to person, place, and time. She appears well-developed and well-nourished. No distress.  Eyes: Conjunctivae and EOM are normal. Pupils are equal, round, and reactive to light.  Cardiovascular: Normal rate, regular rhythm, normal heart sounds and intact distal pulses.   No murmur heard. Pulmonary/Chest: Effort normal and breath sounds normal. No respiratory distress. She has no wheezes.  Abdominal: Soft. Bowel sounds are normal. She exhibits no distension, no ascites and no mass. There is no hepatosplenomegaly. There is tenderness in the left upper quadrant and left lower quadrant. There is no rebound, no guarding and no CVA tenderness.  Musculoskeletal: Normal range of motion. She exhibits no edema or tenderness.  Neurological: She is alert and oriented to person, place, and time. Coordination normal.  Skin: Skin is warm and dry. No rash noted. She is not diaphoretic.  Psychiatric: She has a normal mood and affect. Her behavior is normal.  Nursing note and vitals reviewed.   KUB: Significant gas in bowel loops, await final read by radiology    Assessment & Plan:   Problem List Items Addressed This Visit    None    Visit Diagnoses    Left lateral abdominal pain    -  Primary    Relevant Medications    polyethylene glycol powder (GLYCOLAX/MIRALAX) powder    Other Relevant Orders    DG Abd 1 View    Constipation, unspecified constipation type            Follow up plan: Return if symptoms worsen or fail to improve.  Counseling provided for all of the vaccine components Orders Placed This Encounter  Procedures  . DG Abd [place], MD [facility] 11/05/2015, 12:30 PM

## 2015-11-06 ENCOUNTER — Ambulatory Visit (HOSPITAL_COMMUNITY)
Admission: RE | Admit: 2015-11-06 | Discharge: 2015-11-06 | Disposition: A | Discharge status: Home / Self Care | Payer: Medicare Other | Source: Ambulatory Visit | Attending: Family Medicine | Admitting: Family Medicine

## 2015-11-06 ENCOUNTER — Other Ambulatory Visit: Payer: Self-pay | Admitting: Family

## 2015-11-06 ENCOUNTER — Other Ambulatory Visit: Payer: Self-pay

## 2015-11-06 DIAGNOSIS — K5790 Diverticulosis of intestine, part unspecified, without perforation or abscess without bleeding: Secondary | ICD-10-CM | POA: Insufficient documentation

## 2015-11-06 DIAGNOSIS — R109 Unspecified abdominal pain: Secondary | ICD-10-CM | POA: Diagnosis not present

## 2015-11-06 DIAGNOSIS — K567 Ileus, unspecified: Secondary | ICD-10-CM | POA: Insufficient documentation

## 2015-11-07 DIAGNOSIS — G8929 Other chronic pain: Secondary | ICD-10-CM | POA: Diagnosis not present

## 2015-11-07 DIAGNOSIS — M47816 Spondylosis without myelopathy or radiculopathy, lumbar region: Secondary | ICD-10-CM | POA: Diagnosis not present

## 2015-11-07 DIAGNOSIS — M4806 Spinal stenosis, lumbar region: Secondary | ICD-10-CM | POA: Diagnosis not present

## 2015-11-07 DIAGNOSIS — Z79891 Long term (current) use of opiate analgesic: Secondary | ICD-10-CM | POA: Diagnosis not present

## 2015-11-07 DIAGNOSIS — F329 Major depressive disorder, single episode, unspecified: Secondary | ICD-10-CM | POA: Diagnosis not present

## 2015-11-07 DIAGNOSIS — M4696 Unspecified inflammatory spondylopathy, lumbar region: Secondary | ICD-10-CM | POA: Diagnosis not present

## 2015-11-08 DIAGNOSIS — Z79891 Long term (current) use of opiate analgesic: Secondary | ICD-10-CM | POA: Diagnosis not present

## 2015-11-09 ENCOUNTER — Ambulatory Visit (INDEPENDENT_AMBULATORY_CARE_PROVIDER_SITE_OTHER): Payer: Medicare Other | Admitting: Family Medicine

## 2015-11-09 ENCOUNTER — Encounter: Payer: Self-pay | Admitting: Family Medicine

## 2015-11-09 VITALS — BP 105/81 | HR 78 | Temp 96.9°F | Ht 62.0 in | Wt 189.8 lb

## 2015-11-09 DIAGNOSIS — L03818 Cellulitis of other sites: Secondary | ICD-10-CM | POA: Diagnosis not present

## 2015-11-09 DIAGNOSIS — R1032 Left lower quadrant pain: Secondary | ICD-10-CM

## 2015-11-09 MED ORDER — CEPHALEXIN 500 MG PO CAPS: 500.0000 mg | ORAL_CAPSULE | Freq: Four times a day (QID) | ORAL | Status: DC

## 2015-11-09 NOTE — Progress Notes (Signed)
   HPI  Patient presents today here with lesion on the breast as well as abdominal pain.  Seen for abdominal pain 4 days ago, she had a plain film that had concern for obstruction, follow-up CT did not show an obstruction. She does have diverticulosis but no signs of diverticulitis on the CAT scan.  Since leaving she's had a few stools. She states that the abdominal pain has improved slightly. Last night she developed burning type chest pain that was relieved with belching. She took some over-the-counter acid reducing medicine with good improvement.  She states that she has left breast which she believes she got from a heating pad she was using. Its been there for 5-7 days, it was larger and is now beginning to heal. She has a ring of erythema that developed in the last day or so.  She denies fever, chills, sweats.  PMH: Smoking status noted ROS: Per HPI  Objective: BP 105/81 mmHg  Pulse 78  Temp(Src) 96.9 F (36.1 C) (Oral)  Ht 5\' 2"  (1.575 m)  Wt 189 lb 12.8 oz (86.093 kg)  BMI 34.71 kg/m2 Gen: NAD, alert, cooperative with exam HEENT: NCAT CV: RRR, good S1/S2, no murmur Resp: CTABL, no wheezes, non-labored Abd: Soft, tenderness to palpation in left upper and lower quadrant, no guarding, positive bowel sounds Ext: No edema, warm Neuro: Alert and oriented, No gross deficits Skin Left breast, examined with my CMA present- Jessica Rostoscky 8 cm x 4 cm area of erythema with a central lesion is roughly circular and measures 1.7 cm x 1 cm, shallow, crusted over with no drainage    Assessment and plan:  # Left lower quadrant abdominal pain Likely constipation related, improving with stooling Increased neurologic, red flags for return reviewed  # Cellulitis, left breast With extending area of erythema and covering with Keflex Discussed reasons to return including worsening symptoms or failure to improve. The lesion appears to be healing, however the area of erythema is  concerning for cellulitis. No signs of abscess or staph infection, nonpurulent    Meds ordered this encounter  Medications  . cephALEXin (KEFLEX) 500 MG capsule    Sig: Take 1 capsule (500 mg total) by mouth 4 (four) times daily.    Dispense:  28 capsule    Refill:  [place], MD [facility] 11/09/2015, 9:15 AM

## 2015-11-09 NOTE — Patient Instructions (Signed)
Great to see you!  Increase miralax to 2 times a day, back off when you are having 1 easy stool daily. Usual doses are 1 capful/packet  every other day to 2 capfuls daily  Start keflex for possible infection, cover in vaseline or neosporin. Please let us know if it gets worse  Cellulitis Cellulitis is an infection of the skin and the tissue beneath it. The infected area is usually red and tender. Cellulitis occurs most often in the arms and lower legs.  CAUSES  Cellulitis is caused by bacteria that enter the skin through cracks or cuts in the skin. The most common types of bacteria that cause cellulitis are staphylococci and streptococci. SIGNS AND SYMPTOMS   Redness and warmth.  Swelling.  Tenderness or pain.  Fever. DIAGNOSIS  Your health care provider can usually determine what is wrong based on a physical exam. Blood tests may also be done. TREATMENT  Treatment usually involves taking an antibiotic medicine. HOME CARE INSTRUCTIONS   Take your antibiotic medicine as directed by your health care provider. Finish the antibiotic even if you start to feel better.  Keep the infected arm or leg elevated to reduce swelling.  Apply a warm cloth to the affected area up to 4 times per day to relieve pain.  Take medicines only as directed by your health care provider.  Keep all follow-up visits as directed by your health care provider. SEEK MEDICAL CARE IF:   You notice red streaks coming from the infected area.  Your red area gets larger or turns dark in color.  Your bone or joint underneath the infected area becomes painful after the skin has healed.  Your infection returns in the same area or another area.  You notice a swollen bump in the infected area.  You develop new symptoms.  You have a fever. SEEK IMMEDIATE MEDICAL CARE IF:   You feel very sleepy.  You develop vomiting or diarrhea.  You have a general ill feeling (malaise) with muscle aches and pains.   This  information is not intended to replace advice given to you by your health care provider. Make sure you discuss any questions you have with your health care provider.   Document Released: 02/26/2005 Document Revised: 02/07/2015 Document Reviewed: 08/04/2011 Elsevier Interactive Patient Education [employer].

## 2015-11-13 DIAGNOSIS — M79675 Pain in left toe(s): Secondary | ICD-10-CM | POA: Diagnosis not present

## 2015-11-13 DIAGNOSIS — L03032 Cellulitis of left toe: Secondary | ICD-10-CM | POA: Diagnosis not present

## 2015-11-15 NOTE — Telephone Encounter (Signed)
error 

## 2015-11-19 ENCOUNTER — Other Ambulatory Visit: Payer: Self-pay

## 2015-11-19 ENCOUNTER — Other Ambulatory Visit: Payer: Self-pay | Admitting: Family

## 2015-11-19 DIAGNOSIS — M7062 Trochanteric bursitis, left hip: Secondary | ICD-10-CM | POA: Diagnosis not present

## 2015-11-19 MED ORDER — INSULIN GLARGINE 100 UNIT/ML SOLOSTAR PEN: 38.0000 [IU] | PEN_INJECTOR | Freq: Two times a day (BID) | SUBCUTANEOUS | Status: DC

## 2015-11-21 ENCOUNTER — Encounter (HOSPITAL_COMMUNITY): Payer: Self-pay | Admitting: Psychiatry

## 2015-11-21 ENCOUNTER — Other Ambulatory Visit: Payer: Self-pay | Admitting: Family Medicine

## 2015-11-21 ENCOUNTER — Ambulatory Visit (INDEPENDENT_AMBULATORY_CARE_PROVIDER_SITE_OTHER): Payer: Medicare Other | Admitting: Psychiatry

## 2015-11-21 VITALS — BP 132/80 | HR 64 | Ht 62.0 in | Wt 193.0 lb

## 2015-11-21 DIAGNOSIS — F3110 Bipolar disorder, current episode manic without psychotic features, unspecified: Secondary | ICD-10-CM

## 2015-11-21 MED ORDER — ZALEPLON 10 MG PO CAPS: ORAL_CAPSULE | ORAL | Status: DC

## 2015-11-21 MED ORDER — TEMAZEPAM 15 MG PO CAPS: ORAL_CAPSULE | ORAL | Status: DC

## 2015-11-21 MED ORDER — ARIPIPRAZOLE 15 MG PO TABS: 15.0000 mg | ORAL_TABLET | Freq: Every day | ORAL | Status: DC

## 2015-11-21 NOTE — Progress Notes (Signed)
Patient ID: Amber Stephenson, female   DOB: 03-23-1946, 70 y.o.   MRN: [id number] [facility] MD Progress Note  11/21/2015 3:11 PM Charlot Boepple  MRN:  [id number] Subjective:  Doing well Principal Problem: Bipolar disorder most recent episode manic Diagnosis:  Bipolar disorder most recent episode manic Today's visit is a 15 minute follow-up. Unfortunately since I've seen her last she had another episode of mania when she was irritable angry and was suicidal. She was hospitalized for approximately a week at [facility]. The patient says that the actually did nothing. They didn't change her medicines. After being in the hospital she did calm down. At this time the patient says she's back to her baseline. She denies irritability or euphoria. The patient denies depression. She is sleeping and eating fair. He does say her sleep is still impacted and she's not sleeping normally. The patient is also having a number of GI problems. She recently had a CAT scan of her abdomen which was normal. The patient still says she has ongoing GI problems. The patient has poorly controlled diabetes and sees a specialist. At this time the patient denies any alcohol or drugs. Her husband is here but he doesn't come into the room. The patient denies being suicidal at this time. She's back to functionally normal. She continues taking 15 mg of Abilify and claims the Sonata 10 mg is not helpful. The patient is eating well and has a reasonable amount of energy. The patient enjoys television does read. She does not have any animals. Overall the patient is doing fairly well. I am concerned that she had a lack of ability to contact me. She said she called her office/the hospital but they could not get in contact with me. I remember never getting any messages from this patient or any hospital about her. We will make quicker access system where she'll make contact with this office be able to speak to my nurse immediately. I will of  instant contact with her she's having a problem. I've asked the patient to go back and try to get into therapy. She has the therapist name and she'll make contact with. She chose not to do this because she doesn't want to bring up the past. I think given the fact that she is unstable with her bipolar disorder I need to have close retention with her specifically through one of our therapists. She apparently can see some a therapist named Vickii Chafe who works in our system. The patient at this time is having issues with pain from her joints. She also is poorly controlled diabetes. This patient will continue taking her medications but we will adjust her sleeping medicine. Total Time spent with patient: 30 minutes  Past Psychiatric History:   Past Medical History:  Past Medical History  Diagnosis Date  . Diabetes mellitus without complication ([place])   . Hyperlipidemia   . Hypertension   . Bipolar affective ([place])   . Atrial fibrillation ([place])   . Chronic back pain   . Pain management   . CHF (congestive heart failure) ([place])   . Renal insufficiency     Past Surgical History  Procedure Laterality Date  . Thigh surgery    . Shoulder surgery Right   . Breast reduction surgery     Family History:  Family History  Problem Relation Age of Onset  . Diabetes Mother   . Heart disease Mother   . Heart disease Brother   . Heart disease Sister  CABG  . Mental illness Brother   . Diabetes Brother    Family Psychiatric  History:  Social History:  History  Alcohol Use No     History  Drug Use No    Social History   Social History  . Marital Status: Married    Spouse Name: N/A  . Number of Children: 3  . Years of Education: N/A   Social History Main Topics  . Smoking status: Never Smoker   . Smokeless tobacco: Never Used  . Alcohol Use: No  . Drug Use: No  . Sexual Activity: Yes    Birth Control/ Protection: Post-menopausal   Other Topics Concern  . None   Social History  Narrative   Lives at home with husband.    Additional Social History:                         Sleep: Good  Appetite:  Good  Current Medications: Current Outpatient Prescriptions  Medication Sig Dispense Refill  . apixaban (ELIQUIS) 5 MG TABS tablet Take 5 mg by mouth 2 (two) times daily.    . ARIPiprazole (ABILIFY) 15 MG tablet Take 1 tablet (15 mg total) by mouth daily. 90 tablet 2  . atorvastatin (LIPITOR) 40 MG tablet Take 1 tablet (40 mg total) by mouth daily. 90 tablet 1  . Blood Glucose Monitoring Suppl (FREESTYLE LITE) DEVI Use to check blood sugar tid. DX E11.22 1 each 0  . cephALEXin (KEFLEX) 500 MG capsule Take 1 capsule (500 mg total) by mouth 4 (four) times daily. 28 capsule 0  . Cholecalciferol (VITAMIN D3) 1000 UNITS CAPS Take 1,000 Units by mouth daily.    . Exenatide ER (BYDUREON) 2 MG PEN Inject the contents of one pen once per week 12 each 1  . fenofibrate (TRICOR) 145 MG tablet Take 1 tablet (145 mg total) by mouth daily. 90 tablet 1  . furosemide (LASIX) 20 MG tablet Take 20 mg by mouth daily.     Marland Kitchen glucose blood (FREESTYLE LITE) test strip Test tid. DX E11.22 100 each 5  . Insulin Glargine (LANTUS SOLOSTAR) 100 UNIT/ML Solostar Pen Inject 38 Units into the skin 2 (two) times daily. 30 pen 2  . Insulin Pen Needle (SURE COMFORT PEN NEEDLES) 32G X 4 MM MISC Use with insulin pens as directed. Dx E11.9 300 each 11  . Lancets (FREESTYLE) lancets Test tid. DX E11.22 100 each 5  . lidocaine (LIDODERM) 5 % Place 1 patch onto the skin daily. 12 hours on 12 hours off 90 patch 5  . lisinopril (PRINIVIL,ZESTRIL) 10 MG tablet Take 1 tablet (10 mg total) by mouth daily. 90 tablet 1  . lisinopril (PRINIVIL,ZESTRIL) 5 MG tablet Take 5 mg by mouth daily.    . magnesium 30 MG tablet Take 250 mg by mouth daily.    . metFORMIN (GLUCOPHAGE XR) 500 MG 24 hr tablet Take 2 tablets in the am and 2 tablets in the pm 360 tablet 1  . metoprolol (LOPRESSOR) 100 MG tablet Take 1 tablet  (100 mg total) by mouth 2 (two) times daily. 60 tablet 5  . metoprolol tartrate (LOPRESSOR) 25 MG tablet Take 1 tablet (25 mg total) by mouth 2 (two) times daily. 180 tablet 6  . oxyCODONE-acetaminophen (PERCOCET) 10-325 MG tablet TAKE ONE TABLET BY MOUTH FOUR TIMES A DAY AS NEEDED FOR 30 DAYS  0  . polyethylene glycol powder (GLYCOLAX/MIRALAX) powder Take 17 g by mouth daily as  needed. 3350 g 1  . potassium chloride (K-DUR,KLOR-CON) 10 MEQ tablet Take 10 meq once daily  0  . pregabalin (LYRICA) 50 MG capsule 1 pill morning and afternoon, 2 pills before bed 360 capsule 1  . rizatriptan (MAXALT-MLT) 10 MG disintegrating tablet DISSOLVE 1 TABLET UNDER TONGUE IMMEDIATELY, MAY REPEAT DOSE IN 1 HOUR IF NEEDED- MAX OF 3 TABLETS IN 24 HOURS 10 tablet 2  . SUMAtriptan (IMITREX) 100 MG tablet TAKE 1 TABLET BY MOUTH AS DIRECTED BY MD 9 tablet 0  . temazepam (RESTORIL) 15 MG capsule 2  qhs 60 capsule 3  . tizanidine (ZANAFLEX) 6 MG capsule TAKE 1 CAPSULE THREE TIMES A DAY AS NEEDED FOR MUSCLE SPASMS 90 capsule 1  . traMADol (ULTRAM) 50 MG tablet Take 1 tablet (50 mg total) by mouth every 6 (six) hours as needed. for pain 180 tablet 2  . zaleplon (SONATA) 10 MG capsule 2  qhs 60 capsule 4   No current facility-administered medications for this visit.    Lab Results: No results found for this or any previous visit (from the past 48 hour(s)).  Blood Alcohol level:  Lab Results  Component Value Date   ETH <5 10/01/2015    Physical Findings: AIMS:  , ,  ,  ,    CIWA:    COWS:     Musculoskeletal: Strength & Muscle Tone: within normal limits Gait & Station: normal Patient leans: N/A  Psychiatric Specialty Exam: ROS  Blood pressure 132/80, pulse 64, height 5\' 2"  (1.575 m), weight 193 lb (87.544 kg).Body mass index is 35.29 kg/(m^2).  General Appearance: Casual  Eye Contact::  Good  Speech:  Clear and Coherent  Volume:  Normal  Mood:  Euthymic  Affect:  Appropriate  Thought Process:  Coherent   Orientation:  Full (Time, Place, and Person)  Thought Content:  WDL  Suicidal Thoughts:  No  Homicidal Thoughts:  No  Memory:  NA  Judgement:  Good  Insight:  Good  Psychomotor Activity:  Normal  Concentration:  Good  Recall:  Good  Fund of Knowledge:Good  Language: Good  Akathisia:  No  Handed:  Right  AIMS (if indicated):     Assets: Good spirits   ADL's:  Intact  Cognition: WNL  Sleep:      Treatment Plan Summary:  This patient will be given a return appointment to see me in 2 months for a 30 minute session. She'll be seen with her husband. She'll sign a release to get her discharge summary from her previous hospitalization. At this time she'll continue taking Abilify 15 mg and today we'll add Restoril 15 mg 1 or 2 for sleep. The patient shows no evidence of psychosis. Her mood is fairly stable. The patient is not suicidal at this time. She is not medically well. ongoing care for abdomen and some GI problems. This patient to return to see me in 2 months. She is not suicidal at this time. She denies any chest pain or shortness of breath or any neurological symptoms some abdominal discomfort.  Haskel Schroeder, MD 11/21/2015, 3:11 PM

## 2015-11-22 DIAGNOSIS — L03032 Cellulitis of left toe: Secondary | ICD-10-CM | POA: Diagnosis not present

## 2015-11-23 ENCOUNTER — Encounter: Payer: Self-pay | Admitting: Family Medicine

## 2015-11-23 ENCOUNTER — Ambulatory Visit (INDEPENDENT_AMBULATORY_CARE_PROVIDER_SITE_OTHER): Payer: Medicare Other | Admitting: Family Medicine

## 2015-11-23 VITALS — BP 156/100 | HR 131 | Temp 97.0°F | Ht 62.0 in | Wt 190.2 lb

## 2015-11-23 DIAGNOSIS — N649 Disorder of breast, unspecified: Secondary | ICD-10-CM | POA: Diagnosis not present

## 2015-11-23 MED ORDER — MUPIROCIN 2 % EX OINT: 1.0000 "application " | TOPICAL_OINTMENT | Freq: Two times a day (BID) | CUTANEOUS | Status: DC

## 2015-11-23 NOTE — Progress Notes (Signed)
   HPI  Patient presents today here for follow-up of breast lesion.  Patient states that it improved quite a bit with cephalexin. However, it has not gotten completely better. Over the last few days she is had continued tenderness and pain at the site, she does not have any induration, drainage, or warmth at the site.  It is tender to the touch.  She denies any fever, chills, sweats, or shortness of breath. She's tolerating food and fluids normally.  PMH: Smoking status noted ROS: Per HPI  Objective: BP 156/100 mmHg  Pulse 131  Temp(Src) 97 F (36.1 C) (Oral)  Ht 5\' 2"  (1.575 m)  Wt 190 lb 3.2 oz (86.274 kg)  BMI 34.78 kg/m2 Gen: NAD, alert, cooperative with exam HEENT: NCAT CV: RRR, good S1/S2, no murmur Resp: CTABL, no wheezes, non-labored Ext: No edema, warm Neuro: Alert and oriented, No gross deficits  Limited breast exam, my CMA Nigel Berthold was present during the entire exam. She has a tender erythematous lesion with shallow ulceration and yellow crusting centrally located that measures 9 mm x 5 mm. The entire lesion measures 1.8 mm by proximally 1 cm There is no induration or tenderness surrounding the lesion, the lesion is very tender. There is no drainage  Assessment and plan:  # Breast lesion Started after burn using a heating pad Improved since last visit with Keflex. For now will place mupirocin and keep it dry and clean for the next 5 days, if she does not have improvement by that point we will consider additional antibiotic course. I doubt any kind of malignancy or deep abscess given my exam and the fact that it started after a burn.   Meds ordered this encounter  Medications  . mupirocin ointment (BACTROBAN) 2 %    Sig: Place 1 application into the nose 2 (two) times daily.    Dispense:  22 g    Refill:  [place], MD [facility] 11/23/2015, 2:06 PM

## 2015-11-23 NOTE — Patient Instructions (Signed)
Great to see you!  Try the mupirocin ointment and try to keep it dry, covering with the telfa pad or a large bandaid may be helpful  Call me if it is not getting better by Monday or Tuesday.

## 2015-11-26 ENCOUNTER — Telehealth: Payer: Self-pay | Admitting: Family Medicine

## 2015-11-26 MED ORDER — SULFAMETHOXAZOLE-TRIMETHOPRIM 800-160 MG PO TABS: 1.0000 | ORAL_TABLET | Freq: Two times a day (BID) | ORAL | Status: DC

## 2015-11-26 NOTE — Telephone Encounter (Signed)
Discussed in [place], Will try course of bactrim.   Laroy Apple, MD [facility] 11/26/2015, 4:30 PM

## 2015-11-27 ENCOUNTER — Ambulatory Visit: Payer: Self-pay | Admitting: Cardiovascular Disease

## 2015-11-27 NOTE — Telephone Encounter (Signed)
Pt aware that rx was sent to pharmacy

## 2015-11-30 ENCOUNTER — Telehealth: Payer: Self-pay | Admitting: Endocrinology

## 2015-11-30 NOTE — Telephone Encounter (Signed)
Continue insulin unchanged

## 2015-11-30 NOTE — Telephone Encounter (Addendum)
Pt notified and verbalized understanding.

## 2015-11-30 NOTE — Telephone Encounter (Signed)
Patient stated [facility] did not approve of the Vgo pump, because she did not meet the requirements.please advise

## 2015-12-06 DIAGNOSIS — M4696 Unspecified inflammatory spondylopathy, lumbar region: Secondary | ICD-10-CM | POA: Diagnosis not present

## 2015-12-06 DIAGNOSIS — Z79891 Long term (current) use of opiate analgesic: Secondary | ICD-10-CM | POA: Diagnosis not present

## 2015-12-06 DIAGNOSIS — G8929 Other chronic pain: Secondary | ICD-10-CM | POA: Diagnosis not present

## 2015-12-06 DIAGNOSIS — M47816 Spondylosis without myelopathy or radiculopathy, lumbar region: Secondary | ICD-10-CM | POA: Diagnosis not present

## 2015-12-07 DIAGNOSIS — Z79899 Other long term (current) drug therapy: Secondary | ICD-10-CM | POA: Diagnosis not present

## 2015-12-07 DIAGNOSIS — R809 Proteinuria, unspecified: Secondary | ICD-10-CM | POA: Diagnosis not present

## 2015-12-07 DIAGNOSIS — N183 Chronic kidney disease, stage 3 (moderate): Secondary | ICD-10-CM | POA: Diagnosis not present

## 2015-12-07 DIAGNOSIS — E559 Vitamin D deficiency, unspecified: Secondary | ICD-10-CM | POA: Diagnosis not present

## 2015-12-07 DIAGNOSIS — I10 Essential (primary) hypertension: Secondary | ICD-10-CM | POA: Diagnosis not present

## 2015-12-07 DIAGNOSIS — D509 Iron deficiency anemia, unspecified: Secondary | ICD-10-CM | POA: Diagnosis not present

## 2015-12-12 DIAGNOSIS — I10 Essential (primary) hypertension: Secondary | ICD-10-CM | POA: Diagnosis not present

## 2015-12-12 DIAGNOSIS — N183 Chronic kidney disease, stage 3 (moderate): Secondary | ICD-10-CM | POA: Diagnosis not present

## 2015-12-17 ENCOUNTER — Ambulatory Visit (INDEPENDENT_AMBULATORY_CARE_PROVIDER_SITE_OTHER): Payer: Medicare Other | Admitting: Cardiovascular Disease

## 2015-12-17 ENCOUNTER — Encounter: Payer: Self-pay | Admitting: Cardiovascular Disease

## 2015-12-17 ENCOUNTER — Encounter: Payer: Self-pay | Admitting: Physical Therapy

## 2015-12-17 VITALS — BP 110/82 | HR 81 | Ht 62.0 in | Wt 199.0 lb

## 2015-12-17 DIAGNOSIS — E785 Hyperlipidemia, unspecified: Secondary | ICD-10-CM | POA: Diagnosis not present

## 2015-12-17 DIAGNOSIS — I10 Essential (primary) hypertension: Secondary | ICD-10-CM

## 2015-12-17 DIAGNOSIS — R0602 Shortness of breath: Secondary | ICD-10-CM | POA: Diagnosis not present

## 2015-12-17 DIAGNOSIS — I5032 Chronic diastolic (congestive) heart failure: Secondary | ICD-10-CM

## 2015-12-17 DIAGNOSIS — I4891 Unspecified atrial fibrillation: Secondary | ICD-10-CM

## 2015-12-17 NOTE — Progress Notes (Signed)
Patient ID: Amber Stephenson, female   DOB: Aug 05, 1945, 70 y.o.   MRN: [id number]      SUBJECTIVE: The patient presents for routine follow-up. She has atrial fibrillation and chronic diastolic heart failure. The patient denies any symptoms of chest pain, palpitations, shortness of breath, lightheadedness, dizziness, leg swelling, orthopnea, PND, and syncope.   Review of Systems: As per "subjective", otherwise negative.  Allergies  Allergen Reactions  . Ivp Dye [Iodinated Diagnostic Agents] Swelling    Throat closes    Current Outpatient Prescriptions  Medication Sig Dispense Refill  . apixaban (ELIQUIS) 5 MG TABS tablet Take 5 mg by mouth 2 (two) times daily.    . ARIPiprazole (ABILIFY) 15 MG tablet Take 1 tablet (15 mg total) by mouth daily. 90 tablet 2  . atorvastatin (LIPITOR) 40 MG tablet Take 1 tablet (40 mg total) by mouth daily. 90 tablet 1  . Cholecalciferol (VITAMIN D3) 1000 UNITS CAPS Take 1,000 Units by mouth daily.    . fenofibrate (TRICOR) 145 MG tablet Take 1 tablet (145 mg total) by mouth daily. 90 tablet 1  . furosemide (LASIX) 20 MG tablet Take 20 mg by mouth 2 (two) times daily.     Marland Kitchen glucose blood (FREESTYLE LITE) test strip Test tid. DX E11.22 100 each 5  . Insulin Glargine (LANTUS SOLOSTAR) 100 UNIT/ML Solostar Pen Inject 38 Units into the skin 2 (two) times daily. 30 pen 2  . Insulin Pen Needle (SURE COMFORT PEN NEEDLES) 32G X 4 MM MISC Use with insulin pens as directed. Dx E11.9 300 each 11  . Lancets (FREESTYLE) lancets Test tid. DX E11.22 100 each 5  . lidocaine (LIDODERM) 5 % Place 1 patch onto the skin daily. 12 hours on 12 hours off 90 patch 5  . lisinopril (PRINIVIL,ZESTRIL) 10 MG tablet Take 1 tablet (10 mg total) by mouth daily. 90 tablet 1  . lisinopril (PRINIVIL,ZESTRIL) 5 MG tablet Take 5 mg by mouth daily.    . magnesium 30 MG tablet Take 250 mg by mouth daily.    . metFORMIN (GLUCOPHAGE XR) 500 MG 24 hr tablet Take 2 tablets in the am and 2 tablets in  the pm 360 tablet 1  . metoprolol (LOPRESSOR) 100 MG tablet Take 1 tablet (100 mg total) by mouth 2 (two) times daily. 60 tablet 5  . metoprolol tartrate (LOPRESSOR) 25 MG tablet Take 1 tablet (25 mg total) by mouth 2 (two) times daily. 180 tablet 6  . mupirocin ointment (BACTROBAN) 2 % Place 1 application into the nose 2 (two) times daily. 22 g 0  . niacin 250 MG tablet Take 250 mg by mouth at bedtime.    Marland Kitchen oxyCODONE-acetaminophen (PERCOCET) 10-325 MG tablet TAKE ONE TABLET BY MOUTH FOUR TIMES A DAY AS NEEDED FOR 30 DAYS  0  . polyethylene glycol powder (GLYCOLAX/MIRALAX) powder Take 17 g by mouth daily as needed. 3350 g 1  . potassium chloride (K-DUR,KLOR-CON) 10 MEQ tablet Take 10 meq once daily  0  . pregabalin (LYRICA) 50 MG capsule 1 pill morning and afternoon, 2 pills before bed 360 capsule 1  . rizatriptan (MAXALT-MLT) 10 MG disintegrating tablet DISSOLVE 1 TABLET UNDER TONGUE IMMEDIATELY, MAY REPEAT DOSE IN 1 HOUR IF NEEDED- MAX OF 3 TABLETS IN 24 HOURS 10 tablet 2  . SUMAtriptan (IMITREX) 100 MG tablet TAKE 1 TABLET BY MOUTH AS DIRECTED BY MD 9 tablet 0  . temazepam (RESTORIL) 15 MG capsule 2  qhs 60 capsule 3  . tizanidine (ZANAFLEX)  6 MG capsule TAKE 1 CAPSULE THREE TIMES A DAY AS NEEDED FOR MUSCLE SPASMS 90 capsule 1  . traMADol (ULTRAM) 50 MG tablet Take 1 tablet (50 mg total) by mouth every 6 (six) hours as needed. for pain 180 tablet 2  . zaleplon (SONATA) 10 MG capsule 2  qhs 60 capsule 4  . Blood Glucose Monitoring Suppl (FREESTYLE LITE) DEVI Use to check blood sugar tid. DX E11.22 1 each 0  . Exenatide ER (BYDUREON) 2 MG PEN Inject the contents of one pen once per week 12 each 1   No current facility-administered medications for this visit.    Past Medical History  Diagnosis Date  . Diabetes mellitus without complication ([place])   . Hyperlipidemia   . Hypertension   . Bipolar affective ([place])   . Atrial fibrillation ([name])   . Chronic back pain   . Pain management   . CHF  (congestive heart failure) ([place])   . Renal insufficiency     Past Surgical History  Procedure Laterality Date  . Thigh surgery    . Shoulder surgery Right   . Breast reduction surgery      Social History   Social History  . Marital Status: Married    Spouse Name: N/A  . Number of Children: 3  . Years of Education: N/A   Occupational History  . Not on file.   Social History Main Topics  . Smoking status: Never Smoker   . Smokeless tobacco: Never Used  . Alcohol Use: No  . Drug Use: No  . Sexual Activity: Yes    Birth Control/ Protection: Post-menopausal   Other Topics Concern  . Not on file   Social History Narrative   Lives at home with husband.      Filed Vitals:   12/17/15 1538  BP: 110/82  Pulse: 81  Height: 5\' 2"  (1.575 m)  Weight: 199 lb (90.266 kg)  SpO2: 98%    PHYSICAL EXAM General: NAD HEENT: Normal. Neck: No JVD, no thyromegaly. Lungs: Clear to auscultation bilaterally with normal respiratory effort. CV: Nondisplaced PMI.  Regular rate and irregular rhythm, normal S1/S2, no S3, no murmur. No pretibial or periankle edema.  No carotid bruit.   Abdomen: Soft, nontender, obese.  Neurologic: Alert and oriented.  Psych: Normal affect. Skin: Normal. Musculoskeletal: No gross deformities.    ECG: Most recent ECG reviewed.      ASSESSMENT AND PLAN: 1. Chest discomfort/shortness of breath: Stable. Normal stress test on 06/20/15. Prior symptoms likely related to elevated HR with atrial fibrillation which improved with increase of metoprolol.  2. Atrial fibrillation: HR controlled on metoprolol tartrate 125 mg bid. Anticoagulated with Eliquis 5 mg bid.   3. Essential HTN: Controlled. No changes.  4. Hyperlipidemia: On Lipitor 40 mg.  5. Chronic diastolic heart failure: Euvolemic on Lasix 20 mg bid. Aim to control HR and BP to prevent decompensation.  Dispo: f/u 1 year.  Kate Sable, M.D., F.A.C.C.

## 2015-12-17 NOTE — Patient Instructions (Signed)
Medication Instructions:  Continue all current medications.  Labwork: NONE  Testing/Procedures: NONE  Follow-Up: Your physician wants you to follow up in:  1 year.  You will receive a reminder letter in the mail one-two months in advance.  If you don't receive a letter, please call our office to schedule the follow up appointment   Any Other Special Instructions Will Be Listed Below (If Applicable).  If you need a refill on your cardiac medications before your next appointment, please call your pharmacy.  

## 2015-12-20 ENCOUNTER — Other Ambulatory Visit: Payer: Self-pay | Admitting: Family

## 2015-12-20 ENCOUNTER — Ambulatory Visit: Payer: Medicare Other | Attending: Family Medicine | Admitting: Physical Therapy

## 2015-12-20 DIAGNOSIS — M545 Low back pain, unspecified: Secondary | ICD-10-CM

## 2015-12-20 DIAGNOSIS — R293 Abnormal posture: Secondary | ICD-10-CM | POA: Insufficient documentation

## 2015-12-20 NOTE — Therapy (Signed)
[facility] [place], [place], [address] Phone: [phone]   Fax:  [phone]  Physical Therapy Treatment  Patient Details  Name: Amber Stephenson MRN: [id number] Date of Birth: January 23, 1946 Referring Provider: Kenn File MD  Encounter Date: 12/20/2015      PT End of Session - 12/20/15 1438    Visit Number 9   Number of Visits 12   Date for PT Re-Evaluation 12/24/15   PT Start Time [id number]   PT Stop Time 1516   PT Time Calculation (min) 39 min   Activity Tolerance Patient tolerated treatment well   Behavior During Therapy [facility] for tasks assessed/performed      Past Medical History  Diagnosis Date  . Diabetes mellitus without complication ([place])   . Hyperlipidemia   . Hypertension   . Bipolar affective ([place])   . Atrial fibrillation ([place])   . Chronic back pain   . Pain management   . CHF (congestive heart failure) ([place])   . Renal insufficiency     Past Surgical History  Procedure Laterality Date  . Thigh surgery    . Shoulder surgery Right   . Breast reduction surgery      There were no vitals filed for this visit.      Subjective Assessment - 12/20/15 1437    Subjective Reports that at times her back pain is increased. Reports that pain is the worst upon waking. Reports she had to return early from [place] to [place] secondary to diarrhea/sickness symptoms.   Limitations House hold activities   Patient Stated Goals Would like to decrease my pain for a better quality of life.   Currently in Pain? Yes   Pain Score 5    Pain Location Back   Pain Orientation Lower   Pain Descriptors / Indicators Aching   Pain Type Chronic pain   Pain Onset More than a month ago            [facility] PT Assessment - 12/20/15 0001    Assessment   Medical Diagnosis Chronic low back pain.   Precautions   Precautions None   Restrictions   Weight Bearing Restrictions No                     [facility] Adult  PT Treatment/Exercise - 12/20/15 0001    Modalities   Modalities Ultrasound   Ultrasound   Ultrasound Location R lumbar paraspinals/ Glute   Ultrasound Parameters 1.5 w/cm2, 100%, 1 mhz x10 min   Ultrasound Goals Pain   Manual Therapy   Manual Therapy Myofascial release   Myofascial Release MFR/TPR to R lumbar paraspinals/ Upper Glute in L sidelying to decrease tightness                  PT Short Term Goals - 07/05/15 1927    PT SHORT TERM GOAL #1   Title Ind with a HEP.   Time 2   Period Weeks   Status New           PT Long Term Goals - 07/05/15 1927    PT LONG TERM GOAL #1   Title Perform ADL's with pain not > 3-4/10.   Time 6   Period Weeks   Status New   PT LONG TERM GOAL #2   Title Sit 30 minutes with pain not > 3-4/10.   Time 6   Period Weeks   Status New   PT LONG TERM GOAL #3  Title Transition from sit to stand with pain not > 4/10.   Time 6   Period Weeks   Status New               Plan - 12/20/15 1550    Clinical Impression Statement Patient arrived to treatment with mid level low back today. Moderate tightness noted in R upper glute and R lumbar paraspinals upon palpation during manual therapy. Patient experienced soreness during manual therapy to R lumbar paraspinals per patient report. Normal [place] response noted following end of [place] session. Patient denied stimulation and moist heat secondary to diahrrhea symptoms.    Rehab Potential Good   PT Frequency 2x / week   PT Duration 6 weeks   PT Treatment/Interventions ADLs/Self Care Home Management;Electrical Stimulation;Moist Heat;Therapeutic exercise;Therapeutic activities;Ultrasound;Patient/family education;Manual techniques   PT Next Visit Plan D/C next treatment secondary to end of cert.   PT Home Exercise Plan HEP- SKTC, Figure 4 Piriformis stretch   Consulted and Agree with Plan of Care Patient      Patient will benefit from skilled therapeutic intervention in order to improve the  following deficits and impairments:  Pain, Decreased activity tolerance, Decreased range of motion  Visit Diagnosis: Right-sided low back pain without sciatica  Abnormal posture     Problem List Patient Active Problem List   Diagnosis Date Noted  . Depression   . Encounter for preadmission testing   . Herpes genitalis in women 07/16/2015  . Genital lesion, female 07/12/2015  . Chronic anticoagulation 05/30/2015  . Family history of coronary artery disease in sister 05/30/2015  . Acute on chronic diastolic CHF (congestive heart failure), NYHA class 1 ([place]) 05/30/2015  . Chest pain with moderate risk of acute coronary syndrome 05/29/2015  . Pain in the chest   . Dyspnea   . Essential hypertension   . RLS (restless legs syndrome) 04/27/2015  . Fall 02/26/2015  . Back pain 02/26/2015  . Acute low back pain due to trauma 02/26/2015  . Lipoma 02/08/2015  . Bipolar disorder ([place]) 01/23/2015  . Insomnia 01/23/2015  . CKD stage 3 due to type 2 diabetes mellitus ([place]) 01/05/2015  . Chronic back pain 01/04/2015  . Permanent atrial fibrillation ([place]) 01/04/2015  . Pruritus 01/04/2015  . Diabetes mellitus with stage 2 chronic kidney disease ([place])   . Hyperlipidemia     Wynelle Fanny, PTA 12/20/2015, 3:57 PM  [facility] [address] [place], [place], [address] Phone: [phone]   Fax:  [phone]  Name: Maghen Seivert MRN: [id number] Date of Birth: June 02, 1946

## 2015-12-24 ENCOUNTER — Ambulatory Visit: Payer: Medicare Other | Admitting: Physical Therapy

## 2015-12-24 DIAGNOSIS — M545 Low back pain, unspecified: Secondary | ICD-10-CM

## 2015-12-24 DIAGNOSIS — R293 Abnormal posture: Secondary | ICD-10-CM | POA: Diagnosis not present

## 2015-12-24 NOTE — Therapy (Addendum)
[facility] [place], [place], [address] Phone: [phone]   Fax:  [phone]  Physical Therapy Treatment  Patient Details  Name: Amber Stephenson MRN: [id number] Date of Birth: [birth year] Referring Provider: Kenn File MD  Encounter Date: 12/24/2015      PT End of Session - 12/24/15 1831    Visit Number 10   Number of Visits 12   Date for PT Re-Evaluation 12/24/15   PT Start Time 0230   PT Stop Time 0321   PT Time Calculation (min) 51 min   Activity Tolerance Patient tolerated treatment well      Past Medical History:  Diagnosis Date  . Atrial fibrillation ([place])   . Bipolar affective ([place])   . CHF (congestive heart failure) ([place])   . Chronic back pain   . Diabetes mellitus without complication ([place])   . Hyperlipidemia   . Hypertension   . Pain management   . Renal insufficiency     Past Surgical History:  Procedure Laterality Date  . BREAST REDUCTION SURGERY    . SHOULDER SURGERY Right   . THIGH SURGERY      There were no vitals filed for this visit.      Subjective Assessment - 12/24/15 1828    Subjective The last couple of days have been really good.   Pain Score 2    Pain Location Back   Pain Orientation Lower   Pain Descriptors / Indicators Aching   Pain Frequency Constant                         [facility] Adult PT Treatment/Exercise - 12/24/15 0001      Modalities   Modalities Ultrasound     Ultrasound   Ultrasound Location Right low back musculature.   Ultrasound Parameters 1.50 W/CM2 x 14 minutes.   Ultrasound Goals Pain     Manual Therapy   Myofascial Release STW/M including right QL release technique x 30 minutes.                  PT Short Term Goals - 12/24/15 1833      PT SHORT TERM GOAL #1   Title Ind with a HEP.   Time 2   Period Weeks   Status Achieved           PT Long Term Goals - 12/24/15 1833      PT LONG TERM GOAL #1   Title  Perform ADL's with pain not > 3-4/10.   Baseline Not consistently achieved.   Time 6   Period Weeks   Status Partially Met     PT LONG TERM GOAL #2   Title Sit 30 minutes with pain not > 3-4/10.   Baseline Not consistently achieved.   Time 6   Period Weeks   Status Partially Met     PT LONG TERM GOAL #3   Title Transition from sit to stand with pain not > 4/10.   Baseline Not consistently achieved.   Time 6   Period Weeks   Status Partially Met               Plan - 12/24/15 1832    Clinical Impression Statement Patient discharging from PT today.  She has reported a very low pain-level over the last 2 days.   Rehab Potential Good   PT Treatment/Interventions ADLs/Self Care Home Management;Electrical Stimulation;Moist Heat;Therapeutic exercise;Therapeutic activities;Ultrasound;Patient/family education;Manual techniques  Patient will benefit from skilled therapeutic intervention in order to improve the following deficits and impairments:  Pain, Decreased activity tolerance, Decreased range of motion  Visit Diagnosis: Right-sided low back pain without sciatica  Abnormal posture       G-Codes - 01/16/16 1834    Functional Assessment Tool Used Clinical judgement.   Functional Limitation Mobility: Walking and moving around   Mobility: Walking and Moving Around Current Status [phone]) At least 20 percent but less than 40 percent impaired, limited or restricted   Mobility: Walking and Moving Around Goal Status [phone]) At least 20 percent but less than 40 percent impaired, limited or restricted   Mobility: Walking and Moving Around Discharge Status [phone]) At least 20 percent but less than 40 percent impaired, limited or restricted      Problem List Patient Active Problem List   Diagnosis Date Noted  . Depression   . Encounter for preadmission testing   . Herpes genitalis in women 07/16/2015  . Genital lesion, female 07/12/2015  . Chronic anticoagulation  05/30/2015  . Family history of coronary artery disease in sister 05/30/2015  . Acute on chronic diastolic CHF (congestive heart failure), NYHA class 1 ([place]) 05/30/2015  . Chest pain with moderate risk of acute coronary syndrome 05/29/2015  . Pain in the chest   . Dyspnea   . Essential hypertension   . RLS (restless legs syndrome) 04/27/2015  . Fall 02/26/2015  . Back pain 02/26/2015  . Acute low back pain due to trauma 02/26/2015  . Lipoma 02/08/2015  . Bipolar disorder ([place]) 01/23/2015  . Insomnia 01/23/2015  . CKD stage 3 due to type 2 diabetes mellitus ([place]) 01/05/2015  . Chronic back pain 01/04/2015  . Permanent atrial fibrillation ([place]) 01/04/2015  . Pruritus 01/04/2015  . Diabetes mellitus with stage 2 chronic kidney disease ([place])   . Hyperlipidemia    PHYSICAL THERAPY DISCHARGE SUMMARY  Visits from Start of Care: 10  Current functional level related to goals / functional outcomes: Please see above.   Remaining deficits: Goals not consistently met as she continues to experience LBP.   Education / Equipment: HEP. Plan: Patient agrees to discharge.  Patient goals were partially met. Patient is being discharged due to meeting the stated rehab goals.  ?????       Marguis Mathieson, [place] MPT 01-16-16, 6:42 PM  [facility] [address] [place], [place], [address] Phone: [phone]   Fax:  [phone]  Name: Kenadie Royce MRN: [id number] Date of Birth: [birth year]

## 2015-12-25 ENCOUNTER — Ambulatory Visit: Payer: Medicare Other | Admitting: Family Medicine

## 2015-12-26 ENCOUNTER — Encounter: Payer: Self-pay | Admitting: Family Medicine

## 2016-01-07 ENCOUNTER — Other Ambulatory Visit: Payer: Self-pay | Admitting: Family Medicine

## 2016-01-08 DIAGNOSIS — M4696 Unspecified inflammatory spondylopathy, lumbar region: Secondary | ICD-10-CM | POA: Diagnosis not present

## 2016-01-08 DIAGNOSIS — M47816 Spondylosis without myelopathy or radiculopathy, lumbar region: Secondary | ICD-10-CM | POA: Diagnosis not present

## 2016-01-08 DIAGNOSIS — Z79891 Long term (current) use of opiate analgesic: Secondary | ICD-10-CM | POA: Diagnosis not present

## 2016-01-08 DIAGNOSIS — G8929 Other chronic pain: Secondary | ICD-10-CM | POA: Diagnosis not present

## 2016-01-11 ENCOUNTER — Ambulatory Visit (INDEPENDENT_AMBULATORY_CARE_PROVIDER_SITE_OTHER): Payer: Medicare Other | Admitting: Family Medicine

## 2016-01-11 ENCOUNTER — Encounter: Payer: Self-pay | Admitting: Family Medicine

## 2016-01-11 VITALS — BP 110/68 | HR 62 | Temp 97.0°F | Ht 62.0 in | Wt 189.8 lb

## 2016-01-11 DIAGNOSIS — R1031 Right lower quadrant pain: Secondary | ICD-10-CM | POA: Diagnosis not present

## 2016-01-11 DIAGNOSIS — N182 Chronic kidney disease, stage 2 (mild): Secondary | ICD-10-CM | POA: Diagnosis not present

## 2016-01-11 DIAGNOSIS — E1122 Type 2 diabetes mellitus with diabetic chronic kidney disease: Secondary | ICD-10-CM

## 2016-01-11 DIAGNOSIS — K1370 Unspecified lesions of oral mucosa: Secondary | ICD-10-CM | POA: Diagnosis not present

## 2016-01-11 LAB — BAYER DCA HB A1C WAIVED: HB A1C (BAYER DCA - WAIVED): 6.1 % (ref <7.0)

## 2016-01-11 MED ORDER — MAGIC MOUTHWASH: 5.0000 mL | Freq: Three times a day (TID) | ORAL | 0 refills | Status: DC | PRN

## 2016-01-11 MED ORDER — LINACLOTIDE 145 MCG PO CAPS: ORAL_CAPSULE | ORAL | 0 refills | Status: DC

## 2016-01-11 NOTE — Progress Notes (Signed)
   HPI  Patient presents today for follow-up for diabetes, and oral lesion, and abdominal pain.  Abdominal pain Describes left lower and right upper abdominal pain mainly for the last 6 months or so.No diarrhea, hematochezia, emesis.  She has had decreased appetite No worsening with food Mild improvement with bowel movements. She describes the pain as crampy abdominal pain. He states that she's struggled with constipation for several years, she has approximately 2-3 bowel movements per week.  Tongue lesion She woke up 3 days ago clenching her teeth over her tongue. She feels like the tip of her tongue is bitten bitten off and that she has a lesion on the left side of her tongue. No fever, chills, sweats, or difficulty tolerating food or fluids.  She would like referral to GI to discuss abdominal pain.  Type 2 diabetes Her appetite has decreased that she's been eating better. Good medication compliance No hypoglycemia, she follows up with endocrinology.  PMH: Smoking status noted ROS: Per HPI  Objective: BP 110/68   Pulse 62   Temp 97 F (36.1 C) (Oral)   Ht 5\' 2"  (1.575 m)   Wt 189 lb 12.8 oz (86.1 kg)   BMI 34.71 kg/m  Gen: NAD, alert, cooperative with exam HEENT: NCAT CV: RRR, good S1/S2, no murmur Resp: CTABL, no wheezes, non-labored Abd: Positive bowel sounds, soft, mild tenderness to palpation right upper quadrant, no rebound, no tenderness to palpation in left upper or right lower quadrant Ext: No edema, warm Neuro: Alert and oriented, No gross deficits  Assessment and plan:  # Type 2 diabetes Controlled, congratulated her on her improvement. A1c 6.1 She follows with endocrinology   # Oral lesion Small white oral lesion in the left lateral tongue, it does not appear to be fungal in with three-day history of think it's low likelihood to be concern for malignancy. Recommended magic mouthwash 3 or 4 days Return to clinic as needed  # RLQ ABD pain Unclear  etiology Patient believed that she had had an ultrasound for the workup of this, however I do not see the ultrasound. She does have right upper quadrant pain, ultrasound would be reasonable, however she would like to be referred to GI for complete evaluation. Possible IBS with constipation, Trial of linzess    Orders Placed This Encounter  Procedures  . Bayer DCA Hb A1c Waived  . Ambulatory referral to Gastroenterology    Referral Priority:   Routine    Referral Type:   Consultation    Referral Reason:   Specialty Services Required    Number of Visits Requested:   1    Meds ordered this encounter  Medications  . magic mouthwash SOLN    Sig: Take 5 mLs by mouth 3 (three) times daily as needed for mouth pain.    Dispense:  200 mL    Refill:  0  . linaclotide (LINZESS) 145 MCG CAPS capsule    Sig: 1 capsule daily for 2 weeks then increase to 2 capsules daily    Dispense:  60 capsule    Refill:  0    Laroy Apple, MD [facility] 01/11/2016, 11:17 AM

## 2016-01-11 NOTE — Patient Instructions (Signed)
Great to see you!  Try Linzess, 1 capsule daily for 2 weeks then increase to 2 capsules daily.   Call for an Rx long term it seems to be helping.   You will hear about a GI appointment from [place].

## 2016-01-12 ENCOUNTER — Other Ambulatory Visit: Payer: Self-pay | Admitting: Endocrinology

## 2016-01-14 ENCOUNTER — Other Ambulatory Visit: Payer: Self-pay

## 2016-01-14 MED ORDER — EXENATIDE ER 2 MG ~~LOC~~ PEN: PEN_INJECTOR | SUBCUTANEOUS | 1 refills | Status: DC

## 2016-01-15 ENCOUNTER — Other Ambulatory Visit: Payer: Self-pay | Admitting: Family Medicine

## 2016-01-16 ENCOUNTER — Ambulatory Visit: Payer: Medicare Other | Admitting: Family Medicine

## 2016-01-22 ENCOUNTER — Encounter: Payer: Self-pay | Admitting: Gastroenterology

## 2016-01-25 ENCOUNTER — Other Ambulatory Visit: Payer: Self-pay | Admitting: Family Medicine

## 2016-01-29 ENCOUNTER — Other Ambulatory Visit: Payer: Self-pay | Admitting: Family Medicine

## 2016-01-30 DIAGNOSIS — G8929 Other chronic pain: Secondary | ICD-10-CM | POA: Diagnosis not present

## 2016-01-30 DIAGNOSIS — M47816 Spondylosis without myelopathy or radiculopathy, lumbar region: Secondary | ICD-10-CM | POA: Diagnosis not present

## 2016-02-01 ENCOUNTER — Other Ambulatory Visit: Payer: Self-pay

## 2016-02-01 DIAGNOSIS — H2513 Age-related nuclear cataract, bilateral: Secondary | ICD-10-CM | POA: Diagnosis not present

## 2016-02-01 DIAGNOSIS — E119 Type 2 diabetes mellitus without complications: Secondary | ICD-10-CM | POA: Diagnosis not present

## 2016-02-01 DIAGNOSIS — H524 Presbyopia: Secondary | ICD-10-CM | POA: Diagnosis not present

## 2016-02-01 LAB — HM DIABETES EYE EXAM

## 2016-02-01 MED ORDER — TIZANIDINE HCL 6 MG PO CAPS: ORAL_CAPSULE | ORAL | 0 refills | Status: DC

## 2016-02-01 MED ORDER — METOPROLOL TARTRATE 100 MG PO TABS: 100.0000 mg | ORAL_TABLET | Freq: Two times a day (BID) | ORAL | 3 refills | Status: DC

## 2016-02-05 ENCOUNTER — Telehealth: Payer: Self-pay | Admitting: Family Medicine

## 2016-02-05 DIAGNOSIS — Z79891 Long term (current) use of opiate analgesic: Secondary | ICD-10-CM | POA: Diagnosis not present

## 2016-02-05 NOTE — Telephone Encounter (Signed)
Patient would like to be seen this Thursday afternoon to discuss headaches and refilling her medication.  We do not have any appointments available then.  I scheduled the patient with Dr. Wendi Snipes on 02/12/16 instead.

## 2016-02-06 ENCOUNTER — Ambulatory Visit (INDEPENDENT_AMBULATORY_CARE_PROVIDER_SITE_OTHER): Payer: Medicare Other | Admitting: Family Medicine

## 2016-02-06 ENCOUNTER — Encounter: Payer: Self-pay | Admitting: Family Medicine

## 2016-02-06 VITALS — BP 135/89 | Temp 97.1°F | Ht 62.0 in | Wt 194.2 lb

## 2016-02-06 DIAGNOSIS — E1142 Type 2 diabetes mellitus with diabetic polyneuropathy: Secondary | ICD-10-CM

## 2016-02-06 DIAGNOSIS — G43011 Migraine without aura, intractable, with status migrainosus: Secondary | ICD-10-CM | POA: Diagnosis not present

## 2016-02-06 DIAGNOSIS — E1140 Type 2 diabetes mellitus with diabetic neuropathy, unspecified: Secondary | ICD-10-CM

## 2016-02-06 DIAGNOSIS — G8929 Other chronic pain: Secondary | ICD-10-CM | POA: Diagnosis not present

## 2016-02-06 DIAGNOSIS — M549 Dorsalgia, unspecified: Secondary | ICD-10-CM

## 2016-02-06 HISTORY — DX: Type 2 diabetes mellitus with diabetic neuropathy, unspecified: E11.40

## 2016-02-06 MED ORDER — PREGABALIN 50 MG PO CAPS: ORAL_CAPSULE | ORAL | 1 refills | Status: DC

## 2016-02-06 MED ORDER — SUMATRIPTAN SUCCINATE 100 MG PO TABS: ORAL_TABLET | ORAL | 11 refills | Status: DC

## 2016-02-06 NOTE — Progress Notes (Signed)
Subjective:  Patient ID: Amber Stephenson, female    DOB: 07/14/1945  Age: 70 y.o. MRN: [id number]  CC: Migraine (needs refill on migraine medication)   HPI [name] presents for Current migraine. She uses both Maxalt and Imitrex because she is having so frequent headache. Sometimes it takes 2 pills. Sometimes the headache is 8/10. She is taking some topiramate. It does not seem effective at reducing her pain. Having 24 headaches a month. Some lasting 2 days. The headache is a severe throbbing. It is not unilateral. There is some associated aura at times. Headaches have been ongoing most of her adult life.   History [name] has a past medical history of Atrial fibrillation ([place]); Bipolar affective ([place]); CHF (congestive heart failure) ([place]); Chronic back pain; Diabetes mellitus without complication ([place]); Hyperlipidemia; Hypertension; Pain management; and Renal insufficiency.   She has a past surgical history that includes THIGH SURGERY; Shoulder surgery (Right); and Breast reduction surgery.   Her family history includes Diabetes in her brother and mother; Heart disease in her brother, mother, and sister; Mental illness in her brother.She reports that she has never smoked. She has never used smokeless tobacco. She reports that she does not drink alcohol or use drugs.    ROS Review of Systems  Constitutional: Negative for activity change, appetite change and fever.  HENT: Negative for congestion, rhinorrhea and sore throat.   Eyes: Positive for photophobia. Negative for visual disturbance.  Respiratory: Negative for cough and shortness of breath.   Cardiovascular: Negative for chest pain and palpitations.  Gastrointestinal: Positive for nausea. Negative for abdominal pain and diarrhea.  Genitourinary: Negative for dysuria.  Musculoskeletal: Negative for arthralgias and myalgias.  Neurological: Positive for headaches.    Objective:  BP 135/89   Temp 97.1 F (36.2 C) (Oral)    Ht 5\' 2"  (1.575 m)   Wt 194 lb 4 oz (88.1 kg)   BMI 35.53 kg/m   BP Readings from Last 3 Encounters:  02/12/16 97/67  02/08/16 128/80  02/06/16 135/89    Wt Readings from Last 3 Encounters:  02/12/16 199 lb 3.2 oz (90.4 kg)  02/08/16 195 lb 3.2 oz (88.5 kg)  02/06/16 194 lb 4 oz (88.1 kg)     Physical Exam  Constitutional: She is oriented to person, place, and time. She appears well-developed and well-nourished. No distress.  HENT:  Head: Normocephalic and atraumatic.  Right Ear: External ear normal.  Left Ear: External ear normal.  Nose: Nose normal.  Mouth/Throat: Oropharynx is clear and moist.  Eyes: Conjunctivae and EOM are normal. Pupils are equal, round, and reactive to light.  Neck: Normal range of motion. Neck supple. No thyromegaly present.  Cardiovascular: Normal rate, regular rhythm and normal heart sounds.   No murmur heard. Pulmonary/Chest: Effort normal and breath sounds normal. No respiratory distress. She has no wheezes. She has no rales.  Abdominal: Soft. Bowel sounds are normal. She exhibits no distension. There is no tenderness.  Lymphadenopathy:    She has no cervical adenopathy.  Neurological: She is alert and oriented to person, place, and time. She has normal reflexes.  Skin: Skin is warm and dry.  Psychiatric: She has a normal mood and affect. Her behavior is normal. Judgment and thought content normal.     Lab Results  Component Value Date   WBC 9.3 10/01/2015   HGB 15.0 10/01/2015   HCT 43.5 10/01/2015   PLT 266 10/01/2015   GLUCOSE 320 (H) 10/01/2015   CHOL 163 01/04/2015  TRIG 249 (H) 01/04/2015   HDL 42 01/04/2015   LDLCALC 71 01/04/2015   ALT 18 10/01/2015   AST 21 10/01/2015   NA 136 10/01/2015   K 4.6 10/01/2015   CL 103 10/01/2015   CREATININE 1.01 (H) 10/01/2015   BUN 25 (H) 10/01/2015   CO2 21 (L) 10/01/2015   TSH 3.550 01/04/2015   HGBA1C 9.5 (H) 10/01/2015   MICROALBUR 1.9 07/23/2015    Ct Abdomen Pelvis Wo  Contrast  Result Date: 11/06/2015 CLINICAL DATA:  LEFT SIDE ABD PAIN SINCE 11/02/2015, HX SMALL BOWEL OBSTRUCTION 2 YEARS AGO, PT ALLERGIC TO IV CONTRAST, DRANK READI CAT, HX BREAST REDUCTION ORAL ONLY ADD ON CALL REPORT PT WAITING EXAM: CT ABDOMEN AND PELVIS WITHOUT CONTRAST TECHNIQUE: Multidetector CT imaging of the abdomen and pelvis was performed following the standard protocol without IV contrast. COMPARISON:  Abdominal radiograph 11/05/2015 FINDINGS: Lower chest: Lung bases are clear. Hepatobiliary: No focal hepatic lesion. No biliary duct dilatation. Gallbladder is normal. Common bile duct is normal. Pancreas: Pancreas is normal. No ductal dilatation. No pancreatic inflammation. Spleen: Normal spleen Adrenals/urinary tract: Adrenal glands and kidneys are normal. The ureters and bladder normal. Stomach/Bowel: Stomach, duodenum, small-bowel, appendix and cecum are normal. Colon rectosigmoid colon normal. Several scattered diverticular of the sigmoid colon without inflammation. Vascular/Lymphatic: Abdominal aorta is normal caliber with atherosclerotic calcification. There is no retroperitoneal or periportal lymphadenopathy. No pelvic lymphadenopathy. Reproductive: Uterus and ovaries are normal. Other: No free fluid. Musculoskeletal: No aggressive osseous lesion. IMPRESSION: 1. No evidence of bowel obstruction. 2. Minimal diverticulosis and no evidence of diverticulitis. 3. Normal appendix. Electronically Signed   By: Suzy Bouchard M.D.   On: 11/06/2015 12:37    Assessment & Plan:   Sharrah was seen today for migraine.  Diagnoses and all orders for this visit:  Intractable migraine without aura and with status migrainosus -     Ambulatory referral to Neurology  Diabetic polyneuropathy associated with type 2 diabetes mellitus ([place]) -     Ambulatory referral to Neurology  Chronic back pain -     pregabalin (LYRICA) 50 MG capsule; 2 pill morning and afternoon, 2 pills before bed  Other orders -      SUMAtriptan (IMITREX) 100 MG tablet; Take one at onset of HA. May repeat in 2 hours if headache persists or recurs. Limit two per 24 hours      I have discontinued Ms. Schifano's traMADol, polyethylene glycol powder, mupirocin ointment, and magic mouthwash. I have also changed her pregabalin. Additionally, I am having her start on SUMAtriptan. Lastly, I am having her maintain her apixaban, Vitamin D3, furosemide, potassium chloride, glucose blood, FREESTYLE LITE, freestyle, oxyCODONE-acetaminophen, metoprolol tartrate, Insulin Pen Needle, magnesium, lisinopril, lidocaine, metFORMIN, Insulin Glargine, ARIPiprazole, temazepam, zaleplon, niacin, linaclotide, Exenatide ER, rizatriptan, atorvastatin, tizanidine, and metoprolol.  Meds ordered this encounter  Medications  . SUMAtriptan (IMITREX) 100 MG tablet    Sig: Take one at onset of HA. May repeat in 2 hours if headache persists or recurs. Limit two per 24 hours    Dispense:  10 tablet    Refill:  11  . pregabalin (LYRICA) 50 MG capsule    Sig: 2 pill morning and afternoon, 2 pills before bed    Dispense:  360 capsule    Refill:  1     Follow-up: No Follow-up on file.  [name], M.D.

## 2016-02-07 DIAGNOSIS — M79675 Pain in left toe(s): Secondary | ICD-10-CM | POA: Diagnosis not present

## 2016-02-07 DIAGNOSIS — M2042 Other hammer toe(s) (acquired), left foot: Secondary | ICD-10-CM | POA: Diagnosis not present

## 2016-02-07 DIAGNOSIS — M2041 Other hammer toe(s) (acquired), right foot: Secondary | ICD-10-CM | POA: Diagnosis not present

## 2016-02-07 DIAGNOSIS — M79674 Pain in right toe(s): Secondary | ICD-10-CM | POA: Diagnosis not present

## 2016-02-08 ENCOUNTER — Encounter: Payer: Self-pay | Admitting: Gastroenterology

## 2016-02-08 ENCOUNTER — Ambulatory Visit (INDEPENDENT_AMBULATORY_CARE_PROVIDER_SITE_OTHER): Payer: Medicare Other | Admitting: Gastroenterology

## 2016-02-08 VITALS — BP 128/80 | HR 76 | Ht 62.0 in | Wt 195.2 lb

## 2016-02-08 DIAGNOSIS — R1012 Left upper quadrant pain: Secondary | ICD-10-CM

## 2016-02-08 DIAGNOSIS — Z7901 Long term (current) use of anticoagulants: Secondary | ICD-10-CM

## 2016-02-08 DIAGNOSIS — K5900 Constipation, unspecified: Secondary | ICD-10-CM

## 2016-02-08 NOTE — Patient Instructions (Addendum)
Continue your Linzess daily and Miralax as needed.   Please call our office back and ask for Estill Bamberg when you find out your previous Gastrointestinal doctors name so we can request records.   Normal BMI (Body Mass Index- based on height and weight) is between 23 and 30. Your BMI today is Body mass index is 35.7 kg/m. Marland Kitchen Please consider follow up  regarding your BMI with your Primary Care Provider.  Thank you for choosing me and [facility].  Pricilla Riffle. Dagoberto Ligas., MD., Marval Regal

## 2016-02-08 NOTE — Progress Notes (Signed)
    History of Present Illness: This is a 70 year old female referred by Timmothy Euler, MD for the evaluation of LUQ pain and constipation. She relates that her symptoms have been present for at least 2 years. She was recently started on Linzess 145 g daily with resolution of her symptoms. She still takes MiraLAX as needed for occasional constipation. When she increased Linzess to 290 g daily she had diarrhea and her dose had to be decreased. She states she underwent colonoscopy and upper endoscopy sometime around 2007-2010 in [place], [place]. She states that both were normal however we do not have those records. Denies weight loss, diarrhea, change in stool caliber, melena, hematochezia, nausea, vomiting, dysphagia, reflux symptoms, chest pain.  Abd/pelvic CT IMPRESSION: 1. No evidence of bowel obstruction. 2. Minimal diverticulosis and no evidence of diverticulitis. 3. Normal appendix.  Review of Systems: Pertinent positive and negative review of systems were noted in the above HPI section. All other review of systems were otherwise negative.  Current Medications, Allergies, Past Medical History, Past Surgical History, Family History and Social History were reviewed in [employer] record.  Physical Exam: General: Well developed, well nourished, obese, no acute distress Head: Normocephalic and atraumatic Eyes:  sclerae anicteric, EOMI Ears: Normal auditory acuity Mouth: No deformity or lesions Neck: Supple, no masses or thyromegaly Lungs: Clear throughout to auscultation Heart: Regular rate and rhythm; no murmurs, rubs or bruits Abdomen: Soft, non tender and non distended. No masses, hepatosplenomegaly or hernias noted. Normal Bowel sounds Musculoskeletal: Symmetrical with no gross deformities  Skin: No lesions on visible extremities Pulses:  Normal pulses noted Extremities: No clubbing, cyanosis, edema or deformities noted Neurological: Alert oriented x 4,  grossly nonfocal Cervical Nodes:  No significant cervical adenopathy Inguinal Nodes: No significant inguinal adenopathy Psychological:  Alert and cooperative. Normal mood and affect  Assessment and Recommendations:  1. LUQ pain, constipation. Related to constipation or IBS-C. Continue Linzess 145 mcg daily and Miralax daily as needed.   2. CRC screening, average risk. Attempt to obtain records from [facility] to determine the appropriate date for a 10 year interval screening colonoscopy. If records cannot be obtained and an exact date determined will recommend proceeding with colonoscopy this fall.   3. Afib, chronic anticoagulation with Eliquis.   4. DM   5. CKD   6. Bipolar disorder    cc: Timmothy Euler, MD [place], [place] [address]

## 2016-02-12 ENCOUNTER — Other Ambulatory Visit: Payer: Self-pay | Admitting: Family Medicine

## 2016-02-12 ENCOUNTER — Encounter: Payer: Self-pay | Admitting: Family Medicine

## 2016-02-12 ENCOUNTER — Ambulatory Visit (INDEPENDENT_AMBULATORY_CARE_PROVIDER_SITE_OTHER): Payer: Medicare Other | Admitting: Family Medicine

## 2016-02-12 VITALS — BP 97/67 | HR 61 | Temp 97.1°F | Ht 62.0 in | Wt 199.2 lb

## 2016-02-12 DIAGNOSIS — G43109 Migraine with aura, not intractable, without status migrainosus: Secondary | ICD-10-CM | POA: Diagnosis not present

## 2016-02-12 DIAGNOSIS — L989 Disorder of the skin and subcutaneous tissue, unspecified: Secondary | ICD-10-CM

## 2016-02-12 DIAGNOSIS — Z1231 Encounter for screening mammogram for malignant neoplasm of breast: Secondary | ICD-10-CM | POA: Diagnosis not present

## 2016-02-12 DIAGNOSIS — Z1239 Encounter for other screening for malignant neoplasm of breast: Secondary | ICD-10-CM

## 2016-02-12 DIAGNOSIS — Z0000 Encounter for general adult medical examination without abnormal findings: Secondary | ICD-10-CM | POA: Insufficient documentation

## 2016-02-12 DIAGNOSIS — R238 Other skin changes: Secondary | ICD-10-CM

## 2016-02-12 HISTORY — DX: Migraine with aura, not intractable, without status migrainosus: G43.109

## 2016-02-12 MED ORDER — MUPIROCIN 2 % EX OINT: 1.0000 "application " | TOPICAL_OINTMENT | Freq: Two times a day (BID) | CUTANEOUS | 0 refills | Status: DC

## 2016-02-12 MED ORDER — TOPIRAMATE 50 MG PO TABS: ORAL_TABLET | ORAL | 2 refills | Status: DC

## 2016-02-12 NOTE — Addendum Note (Signed)
Addended by: Ilean China on: 02/12/2016 01:29 PM   Modules accepted: Orders

## 2016-02-12 NOTE — Progress Notes (Signed)
   HPI  Patient presents today here for follow-up of headaches, painful hair follicle, and healthcare maintenance.  Headaches Patient describes 15-20 headaches, that's why she uses rizatriptan as well as sumatriptan, because her insurance company will only by 10 of each. She describes severe unilateral left-sided throbbing headaches, proceeded by a starlike aura, with photophobia and phonophobia. Also with nausea that lasts several hours without medication. Triptans treated very well with resolution within 12 hours.  She would like to get a mammogram.  Hair follicle Left upper thigh with erythematous red papule, this is in the same location as a severely swollen and infected hair follicle a few years ago. She states that today he has improved slightly and she does not want to have it examined, however she wanted to notify me in case it changes. He has not had fevers, chills, sweats, or any other signs of severe infection.   PMH: Smoking status noted ROS: Per HPI  Objective: BP 97/67   Pulse 61   Temp 97.1 F (36.2 C) (Oral)   Ht 5\' 2"  (1.575 m)   Wt 199 lb 3.2 oz (90.4 kg)   BMI 36.43 kg/m  Gen: NAD, alert, cooperative with exam HEENT: NCAT, PERRLA, EOMI CV: RRR, good S1/S2, no murmur Resp: CTABL, no wheezes, non-labored Ext: No edema, warm Neuro: Alert and oriented, normal gait  Assessment and plan:  # Migraine headaches Severe Starting Topamax She has also increased her Lyrica recently. Continue triptan as needed Follow-up one month  # Healthcare maintenance, need for breast cancer screening Mammogram ordered, however does not appear that her insurance is covering easily. One of our quality control nurses will look into the issue.  # Papule Left upper thigh papule that seems to be improving At the site of a previous infection, consider cyst. Not examined today Given mupirocin and recommended hot compresses, low threshold for return  Meds ordered this encounter   Medications  . topiramate (TOPAMAX) 50 MG tablet    Sig: Take 1/2 daily for 1 week, then take 1/2 twice daily, then take 1 in morning and 1/2 at night for 1 week, then take 1 twice daily.    Dispense:  60 tablet    Refill:  2  . mupirocin ointment (BACTROBAN) 2 %    Sig: Place 1 application into the nose 2 (two) times daily.    Dispense:  22 g    Refill:  [place], MD [facility] 02/12/2016, 11:33 AM

## 2016-02-12 NOTE — Patient Instructions (Signed)
Great to see you!  I have sent topamax to your pharmacy, pay careful attention to the label, starting with 1/2 pill daily for 1 week.   Lets follow up in about 1 month to see where you are at with headaches.

## 2016-02-18 ENCOUNTER — Telehealth: Payer: Self-pay | Admitting: Family Medicine

## 2016-02-18 NOTE — Telephone Encounter (Signed)
[facility] to x-ray mammo appointment at [facility] 02/27/16 at 12:15 Arrive at 12pm for registration

## 2016-02-18 NOTE — Telephone Encounter (Signed)
[facility] to x-ray(referrals)

## 2016-02-19 ENCOUNTER — Ambulatory Visit (INDEPENDENT_AMBULATORY_CARE_PROVIDER_SITE_OTHER): Payer: Medicare Other | Admitting: Family Medicine

## 2016-02-19 ENCOUNTER — Encounter: Payer: Self-pay | Admitting: Family Medicine

## 2016-02-19 VITALS — BP 98/69 | HR 66 | Temp 96.7°F | Ht 62.0 in | Wt 197.0 lb

## 2016-02-19 DIAGNOSIS — K1120 Sialoadenitis, unspecified: Secondary | ICD-10-CM

## 2016-02-19 DIAGNOSIS — H04122 Dry eye syndrome of left lacrimal gland: Secondary | ICD-10-CM | POA: Diagnosis not present

## 2016-02-19 DIAGNOSIS — H5712 Ocular pain, left eye: Secondary | ICD-10-CM

## 2016-02-19 MED ORDER — CEPHALEXIN 500 MG PO CAPS: 500.0000 mg | ORAL_CAPSULE | Freq: Four times a day (QID) | ORAL | 0 refills | Status: DC

## 2016-02-19 NOTE — Patient Instructions (Addendum)
Great to see you!  I am treating a possible infection of the parotid gland with keflex  We are working on getting you an appointment with your eye doctor as soon as possible today   Parotitis  Parotitis means one or both of your parotid glands are sore and puffy (inflamed). The parotid glands make spit (saliva) in the mouth. HOME CARE  If you were given antibiotic medicines, take them as told. Finish them even if you start to feel better.  Put warm cloths (compresses) on the sore area.  Only take medicines as told by your doctor.  Drink enough fluids to keep your pee (urine) clear or pale yellow. GET HELP RIGHT AWAY IF:  You have more pain or puffiness (swelling) that is not helped by medicine.  You have a fever. MAKE SURE YOU:  Understand these instructions.  Will watch your condition.  Will get help right away if you are not doing well or get worse.   This information is not intended to replace advice given to you by your health care provider. Make sure you discuss any questions you have with your health care provider.   Document Released: 06/21/2010 Document Revised: 08/11/2011 Document Reviewed: 10/12/2014 Elsevier Interactive Patient Education [employer].

## 2016-02-19 NOTE — Progress Notes (Signed)
   HPI  Patient presents today here with left jaw pain and left eye pain.  Patient's lines that she's had left jaw pain over the last several days which has been much worse over the last 1 day. She describes pain over the parotid gland when she points, she states it hurts worse with palpation She does not have any tooth pain She denies any fever, chills, sweats.  Left eye pain This is better with a patch which she is wearing today, she states that most lites have been hurting it. The started a couple hours ago and is described as persistent pain which is worse with movement looking upward and medially as well as downward and medially. She has been told that she needs a surgery for glaucoma but states that she has not been told she had glaucoma previously. She states her eye doctor said that he she can wait up to a year to get surgery. she reports decreased vision as well  PMH: Smoking status noted ROS: Per HPI  Objective: BP 98/69 (BP Location: Right Arm, Patient Position: Sitting, Cuff Size: Normal)   Pulse 66   Temp (!) 96.7 F (35.9 C) (Oral)   Ht 5\' 2"  (1.575 m)   Wt 197 lb (89.4 kg)   BMI 36.03 kg/m  Gen: NAD, alert, cooperative with exam HEENT: NCAT, tenderness to palpation over the parotid gland on the left, no stones or salivary stones apparent in the mouth, no tenderness to palpation of her teeth, fillings 2 on the bottom left molars No conjunctival injection, tenderness with movement to the medial upper and lowervisual fields,sluggish pupillary response CV: RRR, good S1/S2, no murmur Resp: CTABL, no wheezes, non-labored Ext: No edema, warm Neuro: Alert and oriented, No gross deficits  Assessment and plan:  # Parotitis Most likely parotitis given tenderness to palpation of her parotid gland and characteristic symptoms, no stones apparent in the mouth Treat with Keflex Possibly this is related to her eye pain, however given her concerning ocular history and decreased  vision of think it's very important to get checked by ophthalmology today.  # Left eye pain Tenderness with palpation of the left eye and reported decreased vision with history of increased ocular pressures She has an  appointment with her ophthalmologist today, appreciate evaluation   Meds ordered this encounter  Medications  . cephALEXin (KEFLEX) 500 MG capsule    Sig: Take 1 capsule (500 mg total) by mouth 4 (four) times daily.    Dispense:  28 capsule    Refill:  [place], MD [facility] 02/19/2016, 12:18 PM

## 2016-02-20 ENCOUNTER — Ambulatory Visit (INDEPENDENT_AMBULATORY_CARE_PROVIDER_SITE_OTHER): Payer: Medicare Other | Admitting: Psychiatry

## 2016-02-20 ENCOUNTER — Encounter (HOSPITAL_COMMUNITY): Payer: Self-pay | Admitting: Psychiatry

## 2016-02-20 VITALS — BP 130/76 | HR 78 | Ht 62.0 in | Wt 196.0 lb

## 2016-02-20 DIAGNOSIS — F3110 Bipolar disorder, current episode manic without psychotic features, unspecified: Secondary | ICD-10-CM | POA: Diagnosis not present

## 2016-02-20 NOTE — Progress Notes (Signed)
Patient ID: Amber Stephenson, female   DOB: [birth year], 70 y.o.   MRN: [id number] [facility] MD Progress Note  02/20/2016 4:41 PM Arius Peniston  MRN:  [id number] Subjective:  Doing well Principal Problem: Bipolar disorder most recent episode manic Diagnosis:  Bipolar disorder most recent  Today the patient is seen with her husband, Orpah Greek. Patient is doing great. She denies any depression. She sleeping and eating well. She has no vegetative symptoms. She denies irritability or euphoria. She enjoys decorating her house but not much more. She has no complaints of any abdominal pain. Her sleeping is distinctly improved. The patient believes the Abilify together with the temazepam and been very helpful. Patient denies any mania. She was hospitalized this time because of a persistent suicidal fixation. She claims he did not start with depression. In fact she's not clear she actually felt depressed but she was overwhelmed with the idea of killing herself. She was so distressed that providers evaluated her over 72 hour. And she was hospitalized in [place] for 2 weeks. The patient reiterates that they made no changes. She felt the care was poor. She felt she has little engagement. Certainly she was not suicidal and she was discharged and she's not been suicidal since being discharged. The psychosocial stressor is not clear. She cannot think of any precipitant any argument any loss any problem that predated her intense suicidal ideation. Partially she made no suicide attempt. Patient says her pain is well controlled.  Past Psychiatric History:   Past Medical History:  Past Medical History:  Diagnosis Date  . Atrial fibrillation ([place])   . Bipolar affective ([place])   . CHF (congestive heart failure) ([place])   . Chronic back pain   . Diabetes mellitus without complication ([place])   . Hyperlipidemia   . Hypertension   . Pain management   . Renal insufficiency     Past Surgical History:  Procedure Laterality Date  .  BREAST REDUCTION SURGERY    . SHOULDER SURGERY Right   . THIGH SURGERY     Family History:  Family History  Problem Relation Age of Onset  . Diabetes Mother   . Heart disease Mother   . Heart disease Brother   . Heart disease Sister     CABG  . Mental illness Brother   . Diabetes Brother    Family Psychiatric  History:  Social History:  History  Alcohol Use No     History  Drug Use No    Social History   Social History  . Marital status: Married    Spouse name: N/A  . Number of children: 3  . Years of education: N/A   Social History Main Topics  . Smoking status: Never Smoker  . Smokeless tobacco: Never Used  . Alcohol use No  . Drug use: No  . Sexual activity: Yes    Birth control/ protection: Post-menopausal   Other Topics Concern  . None   Social History Narrative   Lives at home with husband.    Additional Social History:                         Sleep: Good  Appetite:  Good  Current Medications: Current Outpatient Prescriptions  Medication Sig Dispense Refill  . apixaban (ELIQUIS) 5 MG TABS tablet Take 5 mg by mouth 2 (two) times daily.    . ARIPiprazole (ABILIFY) 15 MG tablet Take 1 tablet (15 mg total) by mouth daily. 90 tablet  2  . atorvastatin (LIPITOR) 40 MG tablet TAKE 1 TABLET DAILY 90 tablet 0  . Blood Glucose Monitoring Suppl (FREESTYLE LITE) DEVI Use to check blood sugar tid. DX E11.22 1 each 0  . cephALEXin (KEFLEX) 500 MG capsule Take 1 capsule (500 mg total) by mouth 4 (four) times daily. 28 capsule 0  . Cholecalciferol (VITAMIN D3) 1000 UNITS CAPS Take 1,000 Units by mouth daily.    . Exenatide ER (BYDUREON) 2 MG PEN Inject the contents of one pen once per week 3 each 1  . furosemide (LASIX) 20 MG tablet Take 20 mg by mouth 2 (two) times daily.     Marland Kitchen glucose blood (FREESTYLE LITE) test strip Test tid. DX E11.22 100 each 5  . Insulin Glargine (LANTUS SOLOSTAR) 100 UNIT/ML Solostar Pen Inject 38 Units into the skin 2 (two)  times daily. 30 pen 2  . Insulin Pen Needle (SURE COMFORT PEN NEEDLES) 32G X 4 MM MISC Use with insulin pens as directed. Dx E11.9 300 each 11  . Lancets (FREESTYLE) lancets Test tid. DX E11.22 100 each 5  . lidocaine (LIDODERM) 5 % Place 1 patch onto the skin daily. 12 hours on 12 hours off 90 patch 5  . linaclotide (LINZESS) 145 MCG CAPS capsule 1 capsule daily for 2 weeks then increase to 2 capsules daily 60 capsule 0  . lisinopril (PRINIVIL,ZESTRIL) 5 MG tablet Take 5 mg by mouth daily.    . magnesium 30 MG tablet Take 250 mg by mouth daily.    . metFORMIN (GLUCOPHAGE XR) 500 MG 24 hr tablet Take 2 tablets in the am and 2 tablets in the pm 360 tablet 1  . metoprolol (LOPRESSOR) 100 MG tablet Take 1 tablet (100 mg total) by mouth 2 (two) times daily. 180 tablet 3  . metoprolol tartrate (LOPRESSOR) 25 MG tablet Take 1 tablet (25 mg total) by mouth 2 (two) times daily. 180 tablet 6  . mupirocin ointment (BACTROBAN) 2 % Place 1 application into the nose 2 (two) times daily. 22 g 0  . niacin 250 MG tablet Take 250 mg by mouth at bedtime.    Marland Kitchen oxyCODONE-acetaminophen (PERCOCET) 10-325 MG tablet TAKE ONE TABLET BY MOUTH FOUR TIMES A DAY AS NEEDED FOR 30 DAYS  0  . potassium chloride (K-DUR,KLOR-CON) 10 MEQ tablet Take 10 meq once daily  0  . pregabalin (LYRICA) 50 MG capsule 2 pill morning and afternoon, 2 pills before bed 360 capsule 1  . rizatriptan (MAXALT-MLT) 10 MG disintegrating tablet DISSOLVE 1 TABLET UNDER TONGUE IMMEDIATELY, MAY REPEAT DOSE IN 1 HOUR IF NEEDED- MAX OF 3 TABLETS IN 24 HOURS 10 tablet 2  . SUMAtriptan (IMITREX) 100 MG tablet Take one at onset of HA. May repeat in 2 hours if headache persists or recurs. Limit two per 24 hours 10 tablet 11  . temazepam (RESTORIL) 15 MG capsule 2  qhs 60 capsule 3  . tizanidine (ZANAFLEX) 6 MG capsule TAKE 1 CAPSULE THREE TIMES A DAY AS NEEDED FOR MUSCLE SPASMS 90 capsule 0  . topiramate (TOPAMAX) 50 MG tablet Take 1/2 daily for 1 week, then  take 1/2 twice daily, then take 1 in morning and 1/2 at night for 1 week, then take 1 twice daily. 60 tablet 2  . TRICOR 145 MG tablet TAKE 1 TABLET DAILY 90 tablet 0  . zaleplon (SONATA) 10 MG capsule 2  qhs 60 capsule 4   No current facility-administered medications for this visit.  Lab Results: No results found for this or any previous visit (from the past 48 hour(s)).  Blood Alcohol level:  Lab Results  Component Value Date   ETH <5 10/01/2015    Physical Findings: AIMS:  , ,  ,  ,    CIWA:    COWS:     Musculoskeletal: Strength & Muscle Tone: within normal limits Gait & Station: normal Patient leans: N/A  Psychiatric Specialty Exam: ROS  Blood pressure 130/76, pulse 78, height 5\' 2"  (1.575 m), weight 196 lb (88.9 kg).Body mass index is 35.85 kg/m.  General Appearance: Casual  Eye Contact::  Good  Speech:  Clear and Coherent  Volume:  Normal  Mood:  Euthymic  Affect:  Appropriate  Thought Process:  Coherent  Orientation:  Full (Time, Place, and Person)  Thought Content:  WDL  Suicidal Thoughts:  No  Homicidal Thoughts:  No  Memory:  NA  Judgement:  Good  Insight:  Good  Psychomotor Activity:  Normal  Concentration:  Good  Recall:  Good  Fund of Knowledge:Good  Language: Good  Akathisia:  No  Handed:  Right  AIMS (if indicated):     Assets: Good spirits   ADL's:  Intact  Cognition: WNL  Sleep:      Treatment Plan Summary:  At this time the patient is asymptomatic. She denies depression or any mania symptoms. Most importantly of course that she's not suicidal. She has no pain no distress she sleeping and eating well. Patient is committed to taking her Abilify 15 mg and her Restoril at night for sleep. She occasionally will take Sonata she awakens. The patient denies any neurological symptoms at this time. The patient return to see me in approximately 3 and half months.  Haskel Schroeder, MD 02/20/2016, 4:41 PM

## 2016-02-27 ENCOUNTER — Ambulatory Visit (HOSPITAL_COMMUNITY): Payer: Self-pay

## 2016-02-27 ENCOUNTER — Other Ambulatory Visit: Payer: Self-pay | Admitting: Family Medicine

## 2016-03-03 DIAGNOSIS — M25511 Pain in right shoulder: Secondary | ICD-10-CM | POA: Diagnosis not present

## 2016-03-06 ENCOUNTER — Encounter: Payer: Self-pay | Admitting: Family Medicine

## 2016-03-06 ENCOUNTER — Ambulatory Visit (INDEPENDENT_AMBULATORY_CARE_PROVIDER_SITE_OTHER): Payer: Medicare Other | Admitting: Family Medicine

## 2016-03-06 ENCOUNTER — Ambulatory Visit (HOSPITAL_COMMUNITY)
Admission: RE | Admit: 2016-03-06 | Discharge: 2016-03-06 | Disposition: A | Discharge status: Home / Self Care | Payer: Medicare Other | Source: Ambulatory Visit | Attending: Family Medicine | Admitting: Family Medicine

## 2016-03-06 VITALS — BP 103/73 | HR 83 | Temp 97.0°F | Ht 62.0 in | Wt 193.2 lb

## 2016-03-06 DIAGNOSIS — Z1231 Encounter for screening mammogram for malignant neoplasm of breast: Secondary | ICD-10-CM | POA: Diagnosis not present

## 2016-03-06 DIAGNOSIS — R1084 Generalized abdominal pain: Secondary | ICD-10-CM | POA: Diagnosis not present

## 2016-03-06 DIAGNOSIS — F40240 Claustrophobia: Secondary | ICD-10-CM | POA: Diagnosis not present

## 2016-03-06 MED ORDER — ALPRAZOLAM 1 MG PO TABS: 2.0000 mg | ORAL_TABLET | Freq: Once | ORAL | 0 refills | Status: AC

## 2016-03-06 NOTE — Patient Instructions (Signed)
Great to see you!  Please bring back the stool sample for C diff  Continue getting plenty of fluids  Please come back if your pain is getting worse, you develop fevers, or you have difficulty tolerating  fluids by mouth.

## 2016-03-06 NOTE — Progress Notes (Signed)
   HPI  Patient presents today here with diarrhea and abdominal pain, also requesting Xanax for an MRI.  Abdominal pain With diarrhea for about 2-1/2 weeks, describes 5+ episodes of diarrhea daily, she's had 2 accidents which were embarrassing. Also crampy abdominal pain started in the left upper quadrant, then moved to the right upper quadrant, now in the mid epigastric area. Tolerating fluids well. Different from previous C. difficile infection. Does have history of C. difficile, this started after course of Keflex. Described as crampy, tolerating fluids normally, also tolerating foods without a reduced amount.   She has an MRI scheduled later this week and requests a couple of Xanax to help her tolerate the procedure due to claustrophobia.  PMH: Smoking status noted ROS: Per HPI  Objective: BP 103/73   Pulse 83   Temp 97 F (36.1 C) (Oral)   Ht _0  (1.575 m)   Wt 193 lb 3.2 oz (87.6 kg)   BMI 35.34 kg/m  Gen: NAD, alert, cooperative with exam HEENT: NCAT CV: RRR, good S1/S2, no murmur Resp: CTABL, no wheezes, non-labored Abd: Soft, tenderness to palpation of right lower quadrant, left lower quadrant, periumbilical area, no tenderness to palpation in the right upper or left upper quadrants, no guarding Positive bowel sounds slightly hyperactive Ext: No edema, warm Neuro: Alert and oriented, No gross deficits  Assessment and plan:  # Abdominal pain Crampy abdominal pain with diarrhea Suspect transient viral infection, however this is prolonged for that Rule out C. difficile as the started after an infection Encouraged fluids Abdominal exam is reassuring Consider diverticulitis, however she does not feel ill and has no fever or chills, low threshold for return Consider CT if persistent after 1 week  clausterphobia 2 mg of Xanax given for an MRI later this week   Orders Placed This Encounter  Procedures  . Clostridium Difficile by PCR    Order Specific  Question:   Is your patient experiencing loose or watery stools (3 or more in 24 hours)?    Answer:   Yes    Order Specific Question:   Has the patient received laxatives in the last 24 hours?    Answer:   No    Order Specific Question:   Has a negative Cdiff test resulted in the last 7 days?    Answer:   No  . CMP14+EGFR  . CBC with Differential  . Lipase    Meds ordered this encounter  Medications  . ALPRAZolam (XANAX) 1 MG tablet    Sig: Take 2 tablets (2 mg total) by mouth once.    Dispense:  2 tablet    Refill:  0    Laroy Apple, MD [facility] 03/06/2016, 8:51 AM

## 2016-03-10 DIAGNOSIS — M47816 Spondylosis without myelopathy or radiculopathy, lumbar region: Secondary | ICD-10-CM | POA: Diagnosis not present

## 2016-03-10 DIAGNOSIS — G8929 Other chronic pain: Secondary | ICD-10-CM | POA: Diagnosis not present

## 2016-03-10 DIAGNOSIS — Z79891 Long term (current) use of opiate analgesic: Secondary | ICD-10-CM | POA: Diagnosis not present

## 2016-03-13 ENCOUNTER — Other Ambulatory Visit: Payer: Self-pay | Admitting: Family Medicine

## 2016-03-13 DIAGNOSIS — M25511 Pain in right shoulder: Secondary | ICD-10-CM | POA: Diagnosis not present

## 2016-03-14 DIAGNOSIS — R809 Proteinuria, unspecified: Secondary | ICD-10-CM | POA: Diagnosis not present

## 2016-03-14 DIAGNOSIS — Z79899 Other long term (current) drug therapy: Secondary | ICD-10-CM | POA: Diagnosis not present

## 2016-03-14 DIAGNOSIS — D649 Anemia, unspecified: Secondary | ICD-10-CM | POA: Diagnosis not present

## 2016-03-14 DIAGNOSIS — I10 Essential (primary) hypertension: Secondary | ICD-10-CM | POA: Diagnosis not present

## 2016-03-14 DIAGNOSIS — E559 Vitamin D deficiency, unspecified: Secondary | ICD-10-CM | POA: Diagnosis not present

## 2016-03-14 DIAGNOSIS — N183 Chronic kidney disease, stage 3 (moderate): Secondary | ICD-10-CM | POA: Diagnosis not present

## 2016-03-17 ENCOUNTER — Ambulatory Visit (INDEPENDENT_AMBULATORY_CARE_PROVIDER_SITE_OTHER): Payer: Medicare Other | Admitting: Family Medicine

## 2016-03-17 ENCOUNTER — Encounter: Payer: Self-pay | Admitting: Family Medicine

## 2016-03-17 VITALS — BP 110/73 | HR 68 | Temp 96.8°F | Ht 62.0 in | Wt 197.2 lb

## 2016-03-17 DIAGNOSIS — M549 Dorsalgia, unspecified: Secondary | ICD-10-CM

## 2016-03-17 DIAGNOSIS — R1084 Generalized abdominal pain: Secondary | ICD-10-CM | POA: Diagnosis not present

## 2016-03-17 DIAGNOSIS — Z23 Encounter for immunization: Secondary | ICD-10-CM

## 2016-03-17 DIAGNOSIS — G8929 Other chronic pain: Secondary | ICD-10-CM

## 2016-03-17 DIAGNOSIS — E785 Hyperlipidemia, unspecified: Secondary | ICD-10-CM | POA: Diagnosis not present

## 2016-03-17 DIAGNOSIS — G43109 Migraine with aura, not intractable, without status migrainosus: Secondary | ICD-10-CM

## 2016-03-17 MED ORDER — TOPIRAMATE 50 MG PO TABS: 50.0000 mg | ORAL_TABLET | Freq: Two times a day (BID) | ORAL | 5 refills | Status: DC

## 2016-03-17 MED ORDER — PREGABALIN 50 MG PO CAPS: ORAL_CAPSULE | ORAL | 1 refills | Status: DC

## 2016-03-17 MED ORDER — PREGABALIN 100 MG PO CAPS: 100.0000 mg | ORAL_CAPSULE | Freq: Two times a day (BID) | ORAL | 1 refills | Status: DC

## 2016-03-17 NOTE — Patient Instructions (Signed)
Great to see you!  Come back in 3 months unless you need us sooner.    

## 2016-03-17 NOTE — Progress Notes (Signed)
   HPI  Patient presents today here for follow-up of headaches, hyperlipidemia, and back pain.  Back pain Long-standing chronic low back pain managed at pain management. She takes 40 mg of oxycodone daily, she also takes 100 mg of Lyrica twice daily  Headaches are much better on Topamax 50 mg twice daily, needs refills.  She did not get labs for her abdominal pain and diarrhea, they have improved slightly, diarrhea as completely improved for 3 days, however that the abdominal pain is still lingering. Has improved some  HLD Good med compliance  PMH: Smoking status noted ROS: Per HPI  Objective: BP 110/73   Pulse 68   Temp (!) 96.8 F (36 C) (Oral)   Ht 5\' 2"  (1.575 m)   Wt 197 lb 3.2 oz (89.4 kg)   BMI 36.07 kg/m  Gen: NAD, alert, cooperative with exam HEENT: NCAT CV: RRR, good S1/S2, no murmur Ext: No edema, warm Neuro: Alert and oriented  Assessment and plan:  # chronic back pain Managed with chronic oxycodone  At pain management Refilled Lyrica  # migraine headaches Improved drastically on Topamax, continue, refilled 6 months  # HLD Labs, stable Continue lipitor     Orders Placed This Encounter  Procedures  . Flu Vaccine QUAD 36+ mos IM    Meds ordered this encounter  Medications  . pregabalin (LYRICA) 50 MG capsule    Sig: 2 pill morning and afternoon, 2 pills before bed    Dispense:  360 capsule    Refill:  1  . topiramate (TOPAMAX) 50 MG tablet    Sig: Take 1 tablet (50 mg total) by mouth 2 (two) times daily.    Dispense:  60 tablet    Refill:  [place], MD [facility] 03/17/2016, 4:09 PM

## 2016-03-18 DIAGNOSIS — M25511 Pain in right shoulder: Secondary | ICD-10-CM | POA: Diagnosis not present

## 2016-03-18 DIAGNOSIS — R1084 Generalized abdominal pain: Secondary | ICD-10-CM | POA: Diagnosis not present

## 2016-03-18 LAB — LDL CHOLESTEROL, DIRECT: LDL Direct: 62 mg/dL (ref 0–99)

## 2016-03-19 LAB — CBC WITH DIFFERENTIAL/PLATELET
Basophils Absolute: 0 10*3/uL (ref 0.0–0.2)
Basos: 0 %
EOS (ABSOLUTE): 0.2 10*3/uL (ref 0.0–0.4)
Eos: 2 %
Hematocrit: 36.4 % (ref 34.0–46.6)
Hemoglobin: 11.9 g/dL (ref 11.1–15.9)
Immature Grans (Abs): 0 10*3/uL (ref 0.0–0.1)
Immature Granulocytes: 0 %
Lymphocytes Absolute: 2.6 10*3/uL (ref 0.7–3.1)
Lymphs: 31 %
MCH: 28.1 pg (ref 26.6–33.0)
MCHC: 32.7 g/dL (ref 31.5–35.7)
MCV: 86 fL (ref 79–97)
Monocytes Absolute: 0.6 10*3/uL (ref 0.1–0.9)
Monocytes: 8 %
Neutrophils Absolute: 4.9 10*3/uL (ref 1.4–7.0)
Neutrophils: 59 %
Platelets: 262 10*3/uL (ref 150–379)
RBC: 4.24 x10E6/uL (ref 3.77–5.28)
RDW: 14.9 % (ref 12.3–15.4)
WBC: 8.3 10*3/uL (ref 3.4–10.8)

## 2016-03-19 LAB — CMP14+EGFR
ALT: 10 IU/L (ref 0–32)
AST: 21 IU/L (ref 0–40)
Albumin/Globulin Ratio: 1.6 (ref 1.2–2.2)
Albumin: 4.2 g/dL (ref 3.6–4.8)
Alkaline Phosphatase: 70 IU/L (ref 39–117)
BUN/Creatinine Ratio: 21 (ref 12–28)
BUN: 48 mg/dL — ABNORMAL HIGH (ref 8–27)
Bilirubin Total: 0.3 mg/dL (ref 0.0–1.2)
CO2: 22 mmol/L (ref 18–29)
Calcium: 9.6 mg/dL (ref 8.7–10.3)
Chloride: 104 mmol/L (ref 96–106)
Creatinine, Ser: 2.34 mg/dL — ABNORMAL HIGH (ref 0.57–1.00)
GFR calc Af Amer: 24 mL/min/{1.73_m2} — ABNORMAL LOW (ref >59)
GFR calc non Af Amer: 21 mL/min/{1.73_m2} — ABNORMAL LOW (ref >59)
Globulin, Total: 2.6 g/dL (ref 1.5–4.5)
Glucose: 63 mg/dL — ABNORMAL LOW (ref 65–99)
Potassium: 5.6 mmol/L — ABNORMAL HIGH (ref 3.5–5.2)
Sodium: 144 mmol/L (ref 134–144)
Total Protein: 6.8 g/dL (ref 6.0–8.5)

## 2016-03-19 LAB — LIPASE: Lipase: 27 U/L (ref 14–72)

## 2016-03-19 LAB — SPECIMEN STATUS REPORT

## 2016-03-20 DIAGNOSIS — M722 Plantar fascial fibromatosis: Secondary | ICD-10-CM | POA: Diagnosis not present

## 2016-03-24 ENCOUNTER — Telehealth: Payer: Self-pay | Admitting: Family Medicine

## 2016-03-24 ENCOUNTER — Other Ambulatory Visit (HOSPITAL_COMMUNITY): Payer: Self-pay

## 2016-03-24 MED ORDER — TEMAZEPAM 15 MG PO CAPS: ORAL_CAPSULE | ORAL | 1 refills | Status: DC

## 2016-03-24 NOTE — Telephone Encounter (Signed)
Pt aware of kidney lab results & will be in this week to have labs redrawn

## 2016-03-25 ENCOUNTER — Other Ambulatory Visit (INDEPENDENT_AMBULATORY_CARE_PROVIDER_SITE_OTHER): Payer: Medicare Other

## 2016-03-25 DIAGNOSIS — E875 Hyperkalemia: Secondary | ICD-10-CM

## 2016-03-25 LAB — CMP14+EGFR
ALT: 9 IU/L (ref 0–32)
AST: 14 IU/L (ref 0–40)
Albumin/Globulin Ratio: 1.7 (ref 1.2–2.2)
Albumin: 4.5 g/dL (ref 3.6–4.8)
Alkaline Phosphatase: 73 IU/L (ref 39–117)
BUN/Creatinine Ratio: 20 (ref 12–28)
BUN: 31 mg/dL — ABNORMAL HIGH (ref 8–27)
Bilirubin Total: 0.3 mg/dL (ref 0.0–1.2)
CO2: 26 mmol/L (ref 18–29)
Calcium: 9.9 mg/dL (ref 8.7–10.3)
Chloride: 103 mmol/L (ref 96–106)
Creatinine, Ser: 1.52 mg/dL — ABNORMAL HIGH (ref 0.57–1.00)
GFR calc Af Amer: 40 mL/min/{1.73_m2} — ABNORMAL LOW (ref >59)
GFR calc non Af Amer: 35 mL/min/{1.73_m2} — ABNORMAL LOW (ref >59)
Globulin, Total: 2.6 g/dL (ref 1.5–4.5)
Glucose: 141 mg/dL — ABNORMAL HIGH (ref 65–99)
Potassium: 4.6 mmol/L (ref 3.5–5.2)
Sodium: 143 mmol/L (ref 134–144)
Total Protein: 7.1 g/dL (ref 6.0–8.5)

## 2016-04-01 DIAGNOSIS — M25511 Pain in right shoulder: Secondary | ICD-10-CM | POA: Diagnosis not present

## 2016-04-02 ENCOUNTER — Other Ambulatory Visit: Payer: Self-pay | Admitting: Orthopedic Surgery

## 2016-04-02 DIAGNOSIS — M25511 Pain in right shoulder: Secondary | ICD-10-CM

## 2016-04-09 DIAGNOSIS — G8929 Other chronic pain: Secondary | ICD-10-CM | POA: Diagnosis not present

## 2016-04-09 DIAGNOSIS — Z79891 Long term (current) use of opiate analgesic: Secondary | ICD-10-CM | POA: Diagnosis not present

## 2016-04-09 DIAGNOSIS — M47816 Spondylosis without myelopathy or radiculopathy, lumbar region: Secondary | ICD-10-CM | POA: Diagnosis not present

## 2016-04-10 ENCOUNTER — Ambulatory Visit
Admission: RE | Admit: 2016-04-10 | Discharge: 2016-04-10 | Disposition: A | Discharge status: Home / Self Care | Payer: Medicare Other | Source: Ambulatory Visit | Attending: Orthopedic Surgery | Admitting: Orthopedic Surgery

## 2016-04-10 ENCOUNTER — Other Ambulatory Visit: Payer: Self-pay

## 2016-04-10 ENCOUNTER — Ambulatory Visit (HOSPITAL_COMMUNITY): Payer: Medicare Other

## 2016-04-10 ENCOUNTER — Inpatient Hospital Stay: Admission: RE | Admit: 2016-04-10 | Payer: Self-pay | Source: Ambulatory Visit

## 2016-04-10 ENCOUNTER — Other Ambulatory Visit: Payer: Self-pay | Admitting: Orthopedic Surgery

## 2016-04-10 DIAGNOSIS — M25511 Pain in right shoulder: Secondary | ICD-10-CM

## 2016-04-10 DIAGNOSIS — R937 Abnormal findings on diagnostic imaging of other parts of musculoskeletal system: Secondary | ICD-10-CM | POA: Diagnosis not present

## 2016-04-11 ENCOUNTER — Other Ambulatory Visit: Payer: Medicare Other | Admitting: Pediatrics

## 2016-04-13 ENCOUNTER — Other Ambulatory Visit (HOSPITAL_COMMUNITY): Payer: Self-pay | Admitting: Psychiatry

## 2016-04-15 DIAGNOSIS — M722 Plantar fascial fibromatosis: Secondary | ICD-10-CM | POA: Diagnosis not present

## 2016-04-16 ENCOUNTER — Telehealth: Payer: Self-pay | Admitting: Family Medicine

## 2016-04-16 NOTE — Telephone Encounter (Signed)
Appears that medication is Linzess 145mg  2 daily. If she is still taking 2 daily, decreased to one daily and see if there is resolution of the loose stools. Plus there is a lower dose option now that can be tried if 145mg  is still too much.

## 2016-04-16 NOTE — Telephone Encounter (Signed)
Then jus tstop the medication

## 2016-04-16 NOTE — Telephone Encounter (Signed)
Patient would like to get off her linzess. Patient states that it is giving her diahrea daily and would like to get off of it. Covering PCP, please advise and sed back to the pools.

## 2016-04-16 NOTE — Telephone Encounter (Signed)
Patient states that she is taking Linzess 145mg  1 daily. Advised patient the lower does and she states that she would rather just get off of it completely.

## 2016-04-16 NOTE — Telephone Encounter (Signed)
lmtcb

## 2016-04-16 NOTE — Telephone Encounter (Signed)
Patient aware.

## 2016-04-17 DIAGNOSIS — M25511 Pain in right shoulder: Secondary | ICD-10-CM | POA: Diagnosis not present

## 2016-04-17 DIAGNOSIS — T8484XA Pain due to internal orthopedic prosthetic devices, implants and grafts, initial encounter: Secondary | ICD-10-CM | POA: Diagnosis not present

## 2016-04-18 ENCOUNTER — Telehealth: Payer: Self-pay | Admitting: Cardiovascular Disease

## 2016-04-18 NOTE — Telephone Encounter (Signed)
Can proceed with surgery. Can stop Eliquis 2 days beforehand.

## 2016-04-18 NOTE — Telephone Encounter (Signed)
Last seen July 2017

## 2016-04-18 NOTE — Telephone Encounter (Signed)
Mrs. Polhamus called stating that Dr. Veverly Fells with [facility] is wanting to schedule surgery on her shoulder.  She stated that the doctors office told her to call Dr. Bronson Ing to get approval for the surgery and to let patient know when she needs to come off of her Eliquis.

## 2016-04-21 NOTE — Telephone Encounter (Signed)
Pt aware - will forward this note to Dr. Veverly Fells

## 2016-04-22 ENCOUNTER — Encounter: Payer: Self-pay | Admitting: Family Medicine

## 2016-04-22 ENCOUNTER — Ambulatory Visit (INDEPENDENT_AMBULATORY_CARE_PROVIDER_SITE_OTHER): Payer: Medicare Other | Admitting: Family Medicine

## 2016-04-22 VITALS — BP 156/96 | HR 82 | Temp 97.1°F | Ht 62.0 in | Wt 188.8 lb

## 2016-04-22 DIAGNOSIS — Z01818 Encounter for other preprocedural examination: Secondary | ICD-10-CM | POA: Diagnosis not present

## 2016-04-22 DIAGNOSIS — I10 Essential (primary) hypertension: Secondary | ICD-10-CM | POA: Diagnosis not present

## 2016-04-22 DIAGNOSIS — G43109 Migraine with aura, not intractable, without status migrainosus: Secondary | ICD-10-CM | POA: Diagnosis not present

## 2016-04-22 DIAGNOSIS — I482 Chronic atrial fibrillation: Secondary | ICD-10-CM | POA: Diagnosis not present

## 2016-04-22 DIAGNOSIS — I4821 Permanent atrial fibrillation: Secondary | ICD-10-CM

## 2016-04-22 NOTE — Patient Instructions (Signed)
Great to see you!   

## 2016-04-22 NOTE — Progress Notes (Signed)
   HPI  Patient presents today for preoperative evaluation.  Patient is in good health and feels well. She has no complaints today.  She recently stopped Topamax because of the side effect of decreased libido. She is not complaining of headaches today.  Patient also had several weeks of diarrhea, she stopped linzess and this has improved. We discussed this last visit as a possibilty.   She can easily walk to her mailbox without shortness of breath or chest pain, she denies any chest pain all today. She states that she can walk up a flight of stairs with only appropriate short windedness.   PMH: Smoking status noted ROS: Per HPI  Objective: BP (!) 156/96   Pulse 82   Temp 97.1 F (36.2 C) (Oral)   Ht 5\' 2"  (1.575 m)   Wt 188 lb 12.8 oz (85.6 kg)   BMI 34.53 kg/m  Gen: NAD, alert, cooperative with exam HEENT: NCAT CV: RRR, good S1/S2, no murmur Resp: CTABL, no wheezes, non-labored Ext: No edema, warm Neuro: Alert and oriented, No gross deficits  Assessment and plan:  # Preoperative evaluation Appreciate cardiology's opinion, she has had recent enough evaluation for them to clear her from a cardiology standpoint She will stop eliquis 2 days before procedure She is at baseline health otherwise she can easily perform 4 mets of exercise without a problem.  Paperwork signed and filled out A1C controlled Several conditions make her higher than average risk including DM2 and CKD, this was discussed and she would lik eto proceed with surgery.   # Hypertension Elevated today, however and several recent visits since been very well controlled I suspect some component of anxiety today No medication adjustments.  # Migraine headaches Stable symptoms, recently stopped Topamax due to difficulty with libido She has polypharmacy, I am very happy that she is stopping some medications.  Atrial fibrillation Chronic anticoagulation with eliquis Stop 2 days prior to surgery rate  controlled  Laroy Apple, MD [facility] 04/22/2016, 4:17 PM

## 2016-04-27 ENCOUNTER — Other Ambulatory Visit: Payer: Self-pay | Admitting: Family Medicine

## 2016-04-30 DIAGNOSIS — E669 Obesity, unspecified: Secondary | ICD-10-CM | POA: Diagnosis not present

## 2016-04-30 DIAGNOSIS — E871 Hypo-osmolality and hyponatremia: Secondary | ICD-10-CM | POA: Diagnosis not present

## 2016-04-30 DIAGNOSIS — N183 Chronic kidney disease, stage 3 (moderate): Secondary | ICD-10-CM | POA: Diagnosis not present

## 2016-05-07 DIAGNOSIS — Z79891 Long term (current) use of opiate analgesic: Secondary | ICD-10-CM | POA: Diagnosis not present

## 2016-05-07 DIAGNOSIS — G8929 Other chronic pain: Secondary | ICD-10-CM | POA: Diagnosis not present

## 2016-05-07 DIAGNOSIS — M47816 Spondylosis without myelopathy or radiculopathy, lumbar region: Secondary | ICD-10-CM | POA: Diagnosis not present

## 2016-05-12 ENCOUNTER — Other Ambulatory Visit: Payer: Self-pay | Admitting: Family Medicine

## 2016-05-13 ENCOUNTER — Telehealth: Payer: Self-pay | Admitting: Family Medicine

## 2016-05-13 MED ORDER — RIZATRIPTAN BENZOATE 10 MG PO TBDP: ORAL_TABLET | ORAL | 2 refills | Status: DC

## 2016-05-13 NOTE — Telephone Encounter (Signed)
Per pt she no longer takes Topamax She gets 18 migraines per month She needs 2 RXs per month  to cover migraines  She needs refill on Maxalt Please advise

## 2016-05-13 NOTE — Telephone Encounter (Signed)
Pt notified of RX 

## 2016-05-13 NOTE — Telephone Encounter (Signed)
Refilled, would recommend Headache clinic referral.    Laroy Apple, MD [facility] 05/13/2016, 12:27 PM

## 2016-05-15 ENCOUNTER — Ambulatory Visit (INDEPENDENT_AMBULATORY_CARE_PROVIDER_SITE_OTHER): Payer: Medicare Other

## 2016-05-15 ENCOUNTER — Telehealth: Payer: Self-pay | Admitting: Cardiovascular Disease

## 2016-05-15 DIAGNOSIS — R002 Palpitations: Secondary | ICD-10-CM

## 2016-05-15 NOTE — Telephone Encounter (Signed)
Pt will come today at 11:30 for holter monitor - will place orders

## 2016-05-15 NOTE — Telephone Encounter (Signed)
Pt days last night she felt "twitter" in her heart that last ed about an hour - says husband checked HR and seemed "normal" through radial pulse. C/o SOB for the last 3 days - denies weight gain, dizziness, swelling, and chest pain - says she hasn't felt the "twitter"  since last night - says she is concerned she is in Afib - routed to Dr. Bronson Ing

## 2016-05-15 NOTE — Telephone Encounter (Signed)
Amber Stephenson called stating that she went into Atrial Fib last evening. States that she is very concerned about the way she is feeling.

## 2016-05-15 NOTE — Telephone Encounter (Signed)
She has known a fib so this is to be expected from time to time. What is important is HR control. Can get 24 hour Holter to see what average HR is to see if medication dosage needs to be adjusted.

## 2016-05-21 ENCOUNTER — Ambulatory Visit (INDEPENDENT_AMBULATORY_CARE_PROVIDER_SITE_OTHER): Payer: Medicare Other | Admitting: Psychiatry

## 2016-05-21 ENCOUNTER — Encounter (HOSPITAL_COMMUNITY): Payer: Self-pay | Admitting: Psychiatry

## 2016-05-21 VITALS — BP 126/74 | HR 83 | Ht 62.0 in | Wt 192.6 lb

## 2016-05-21 DIAGNOSIS — Z833 Family history of diabetes mellitus: Secondary | ICD-10-CM | POA: Diagnosis not present

## 2016-05-21 DIAGNOSIS — Z794 Long term (current) use of insulin: Secondary | ICD-10-CM

## 2016-05-21 DIAGNOSIS — Z9889 Other specified postprocedural states: Secondary | ICD-10-CM | POA: Diagnosis not present

## 2016-05-21 DIAGNOSIS — Z79899 Other long term (current) drug therapy: Secondary | ICD-10-CM

## 2016-05-21 DIAGNOSIS — Z79891 Long term (current) use of opiate analgesic: Secondary | ICD-10-CM

## 2016-05-21 DIAGNOSIS — F259 Schizoaffective disorder, unspecified: Secondary | ICD-10-CM

## 2016-05-21 DIAGNOSIS — Z8249 Family history of ischemic heart disease and other diseases of the circulatory system: Secondary | ICD-10-CM

## 2016-05-21 DIAGNOSIS — Z818 Family history of other mental and behavioral disorders: Secondary | ICD-10-CM

## 2016-05-21 MED ORDER — TEMAZEPAM 15 MG PO CAPS: ORAL_CAPSULE | ORAL | 4 refills | Status: DC

## 2016-05-21 MED ORDER — ARIPIPRAZOLE 10 MG PO TABS: ORAL_TABLET | ORAL | 8 refills | Status: DC

## 2016-05-21 NOTE — Progress Notes (Signed)
Patient ID: Amber Stephenson, female   DOB: 1945-08-07, 70 y.o.   MRN: [id number] [facility] MD Progress Note  05/21/2016 3:30 PM Amber Stephenson  MRN:  [id number] Subjective:  Doing well Principal Problem: Bipolar disorder most recent episode manic Diagnosis:  Bipolar disorder most recent  At this time patient is not doing all that well. She now admits to me that she's been hearing voices and seeing visions. We reviewed her discharge summary from her hospital and was evident the patient had stopped her medicine 2 weeks before she was admitted. She was admitted really a manic state. He was manic irritable and actually was suicidal. She denies being persistently depressed but clearly was manic. She is the case of being manic and being suicidal and racing thoughts agitated and even aggressive and hostile at home weeks before being admitted. Today she seen with her husband white. He says she is much better now. He claims that she is depressed or irritable. She has not violent or aggressive. She is sleeping well but she's not eating well at all. She's lost 6 pounds in the last month. Her energy level is good she can concentrate without problems. She denies being suicidal. She denies racing thinking. She denies being hyperactive. She does admit to hearing voices multiple voices mainly at night or late evening. The patient says she's not sleeping during the time. She says she also sees visions. She clearly seems to be demonstrating some psychosis at this time. Is not clear what stresses she is having at this time. Past Psychiatric History:   Past Medical History:  Past Medical History:  Diagnosis Date  . Atrial fibrillation ([place])   . Bipolar affective ([place])   . CHF (congestive heart failure) ([place])   . Chronic back pain   . Diabetes mellitus without complication ([place])   . Hyperlipidemia   . Hypertension   . Pain management   . Renal insufficiency     Past Surgical History:  Procedure Laterality Date  .  BREAST REDUCTION SURGERY    . SHOULDER SURGERY Right   . THIGH SURGERY     Family History:  Family History  Problem Relation Age of Onset  . Diabetes Mother   . Heart disease Mother   . Heart disease Brother   . Heart disease Sister     CABG  . Mental illness Brother   . Diabetes Brother    Family Psychiatric  History:  Social History:  History  Alcohol Use No     History  Drug Use No    Social History   Social History  . Marital status: Married    Spouse name: N/A  . Number of children: 3  . Years of education: N/A   Social History Main Topics  . Smoking status: Never Smoker  . Smokeless tobacco: Never Used  . Alcohol use No  . Drug use: No  . Sexual activity: Yes    Birth control/ protection: Post-menopausal   Other Topics Concern  . None   Social History Narrative   Lives at home with husband.    Additional Social History:                         Sleep: Good  Appetite:  Good  Current Medications: Current Outpatient Prescriptions  Medication Sig Dispense Refill  . apixaban (ELIQUIS) 5 MG TABS tablet Take 5 mg by mouth 2 (two) times daily.    . ARIPiprazole (ABILIFY) 10 MG tablet  2 qam 60 tablet 8  . atorvastatin (LIPITOR) 40 MG tablet TAKE 1 TABLET DAILY 90 tablet 0  . Blood Glucose Monitoring Suppl (FREESTYLE LITE) DEVI Use to check blood sugar tid. DX E11.22 1 each 0  . Cholecalciferol (VITAMIN D3) 1000 UNITS CAPS Take 1,000 Units by mouth daily.    . Exenatide ER (BYDUREON) 2 MG PEN Inject the contents of one pen once per week 3 each 1  . furosemide (LASIX) 20 MG tablet Take 20 mg by mouth 2 (two) times daily.     Marland Kitchen glucose blood (FREESTYLE LITE) test strip Test tid. DX E11.22 100 each 5  . Insulin Glargine (LANTUS SOLOSTAR) 100 UNIT/ML Solostar Pen Inject 38 Units into the skin 2 (two) times daily. 30 pen 2  . Insulin Pen Needle (SURE COMFORT PEN NEEDLES) 32G X 4 MM MISC Use with insulin pens as directed. Dx E11.9 300 each 11  . Lancets  (FREESTYLE) lancets Test tid. DX E11.22 100 each 5  . lidocaine (LIDODERM) 5 % Place 1 patch onto the skin daily. 12 hours on 12 hours off 90 patch 5  . lisinopril (PRINIVIL,ZESTRIL) 5 MG tablet Take 5 mg by mouth daily.    . magnesium 30 MG tablet Take 250 mg by mouth daily.    . metFORMIN (GLUCOPHAGE XR) 500 MG 24 hr tablet Take 2 tablets in the am and 2 tablets in the pm 360 tablet 1  . metoprolol (LOPRESSOR) 100 MG tablet Take 1 tablet (100 mg total) by mouth 2 (two) times daily. 180 tablet 3  . mupirocin ointment (BACTROBAN) 2 % Place 1 application into the nose 2 (two) times daily. 22 g 0  . niacin 250 MG tablet Take 250 mg by mouth at bedtime.    Marland Kitchen oxyCODONE-acetaminophen (PERCOCET) 10-325 MG tablet TAKE ONE TABLET BY MOUTH FOUR TIMES A DAY AS NEEDED FOR 30 DAYS  0  . potassium chloride (K-DUR,KLOR-CON) 10 MEQ tablet Take 10 meq once daily  0  . pregabalin (LYRICA) 100 MG capsule Take 1 capsule (100 mg total) by mouth 2 (two) times daily. 180 capsule 1  . rizatriptan (MAXALT-MLT) 10 MG disintegrating tablet DISSOLVE 1 TABLET UNDER TONGUE IMMEDIATELY, MAY REPEAT DOSE IN 1 HOUR IF NEEDED- MAX OF 3 TABLETS IN 24 HOURS 10 tablet 0  . SUMAtriptan (IMITREX) 100 MG tablet Take one at onset of HA. May repeat in 2 hours if headache persists or recurs. Limit two per 24 hours 10 tablet 11  . temazepam (RESTORIL) 15 MG capsule 2  qhs 60 capsule 4  . tizanidine (ZANAFLEX) 6 MG capsule TAKE 1 CAPSULE THREE TIMES A DAY AS NEEDED FOR MUSCLE SPASMS 90 capsule 1  . TRICOR 145 MG tablet TAKE 1 TABLET DAILY 90 tablet 0  . zaleplon (SONATA) 10 MG capsule 2  qhs 60 capsule 4   No current facility-administered medications for this visit.     Lab Results: No results found for this or any previous visit (from the past 48 hour(s)).  Blood Alcohol level:  Lab Results  Component Value Date   ETH <5 10/01/2015    Physical Findings: AIMS:  , ,  ,  ,    CIWA:    COWS:     Musculoskeletal: Strength &  Muscle Tone: within normal limits Gait & Station: normal Patient leans: N/A  Psychiatric Specialty Exam: ROS  Blood pressure 126/74, pulse 83, height 5\' 2"  (1.575 m), weight 192 lb 9.6 oz (87.4 kg).Body mass index is 35.23  kg/m.  General Appearance: Casual  Eye Contact::  Good  Speech:  Clear and Coherent  Volume:  Normal  Mood:  Euthymic  Affect:  Appropriate  Thought Process:  Coherent  Orientation:  Full (Time, Place, and Person)  Thought Content:  WDL  Suicidal Thoughts:  No  Homicidal Thoughts:  No  Memory:  NA  Judgement:  Good  Insight:  Good  Psychomotor Activity:  Normal  Concentration:  Good  Recall:  Good  Fund of Knowledge:Good  Language: Good  Akathisia:  No  Handed:  Right  AIMS (if indicated):     Assets: Good spirits   ADL's:  Intact  Cognition: WNL  Sleep:      Treatment Plan Summary:  At this time we had a long discussion of the importance of taking her medicines. I so try to engage her husband making sure she takes her medicines. I fear this patient is noncompliant. When her last visit she failed to tell me that she was hearing voices and seeing visions. The patient says she was embarrassed and hopefully if she didn't bring it up it would just go away but it did not. The voices are not commanding. The patient is not suicidal. The patient is functioning fairly well other than hearing voices and seeing visions which occur only at night. The patient is awake when it happens. I not clear exactly the etiology but I believe this patient likely has schizoaffective disorder bipolar type. At this time we'll go ahead and increase her Abilify from a dose of 15 mg dose of 20 mg. The patient also continue taking Restoril. Unfortunately she claims she ran out of it and there were no refills. I think this is probably true. Today we gave her a prescription for Restoril and called in her Abilify at the higher dose. This patient to return to see me in one month. Haskel Schroeder, MD 05/21/2016, 3:30 PM

## 2016-05-22 ENCOUNTER — Other Ambulatory Visit: Payer: Self-pay | Admitting: Family Medicine

## 2016-05-23 ENCOUNTER — Telehealth: Payer: Self-pay | Admitting: Cardiovascular Disease

## 2016-05-23 NOTE — Telephone Encounter (Signed)
Pt aware and says no symptoms and did not want appt with extender - have forwarded results to Dr. Bronson Ing and nurse

## 2016-05-23 NOTE — Telephone Encounter (Signed)
Monitor results uploaded to pt chart today - will forward to DOD Dr. Bronson Ing out of office

## 2016-05-23 NOTE — Telephone Encounter (Signed)
Please copy this note to chart. Holter monitor reviewed in Dr Marthann Schiller absence. Shows rate controlled afib. From prior notes she has history of afib. Rates are well controlled. In absence of symptoms there is nothing new to do given that her rates are well controlled. If she is symptomatic can schedule an appt with PA for next week. If she is very symptomatic she may be seen in the ER, but typically afib that is rate controlled is well tolerated   Zandra Abts MD

## 2016-05-23 NOTE — Telephone Encounter (Signed)
Mrs. Pruitt called to see if we have test results of monitor.

## 2016-05-29 ENCOUNTER — Telehealth: Payer: Self-pay | Admitting: *Deleted

## 2016-05-29 NOTE — Telephone Encounter (Signed)
Pt aware - routed to pcp  

## 2016-05-29 NOTE — Telephone Encounter (Signed)
-----   Message from Herminio Commons, MD sent at 05/29/2016 10:03 AM EST ----- HR is controlled. No changes to meds.

## 2016-05-30 ENCOUNTER — Telehealth: Payer: Self-pay | Admitting: Family Medicine

## 2016-06-02 HISTORY — PX: COLONOSCOPY: SHX174

## 2016-06-03 ENCOUNTER — Telehealth: Payer: Self-pay | Admitting: *Deleted

## 2016-06-03 MED ORDER — TIZANIDINE HCL 6 MG PO CAPS: ORAL_CAPSULE | ORAL | 3 refills | Status: DC

## 2016-06-03 MED ORDER — FUROSEMIDE 20 MG PO TABS: 20.0000 mg | ORAL_TABLET | Freq: Two times a day (BID) | ORAL | 1 refills | Status: DC

## 2016-06-03 NOTE — Telephone Encounter (Signed)
Patient aware meds sent to pharmacy.

## 2016-06-03 NOTE — Telephone Encounter (Signed)
Patient feeling short of breath at times.  Advised to go to emergency department if gets worse.  Appointment scheduled for after lunch,(per patient request of time), tomorrow with a provider.

## 2016-06-03 NOTE — Telephone Encounter (Signed)
Needs 90 day sent to [facility]. Then, needs a 14 day supply of zanaflex sent to [facility]

## 2016-06-03 NOTE — Telephone Encounter (Signed)
Rx sent as requested  Laroy Apple, MD [place]  [facility] 06/03/2016, 11:02 AM

## 2016-06-04 ENCOUNTER — Ambulatory Visit (INDEPENDENT_AMBULATORY_CARE_PROVIDER_SITE_OTHER): Payer: Medicare Other | Admitting: Family Medicine

## 2016-06-04 ENCOUNTER — Encounter: Payer: Self-pay | Admitting: Family Medicine

## 2016-06-04 ENCOUNTER — Ambulatory Visit (INDEPENDENT_AMBULATORY_CARE_PROVIDER_SITE_OTHER): Payer: Medicare Other

## 2016-06-04 VITALS — BP 146/88 | HR 96 | Temp 97.2°F | Ht 62.0 in | Wt 195.0 lb

## 2016-06-04 DIAGNOSIS — R0781 Pleurodynia: Secondary | ICD-10-CM

## 2016-06-04 MED ORDER — DULOXETINE HCL 30 MG PO CPEP: 30.0000 mg | ORAL_CAPSULE | Freq: Every day | ORAL | 0 refills | Status: DC

## 2016-06-04 MED ORDER — ALPRAZOLAM 0.25 MG PO TABS: 0.2500 mg | ORAL_TABLET | Freq: Two times a day (BID) | ORAL | 0 refills | Status: DC | PRN

## 2016-06-04 NOTE — Progress Notes (Signed)
Subjective:  Patient ID: Amber Stephenson, female    DOB: 23-Dec-1945  Age: 71 y.o. MRN: [id number]  CC: Cough (pt here today c/o cough and soreness when she takes a deep breath. Pt states she is under a lot of stress now as her baby sister died on Christmas and her daughter recently arrested with heroin on her.)   HPI Amber Stephenson presents for  Symptoms above leading to a lot of anxiety. GAD 7 reviewed below with pt.  GAD 7 : Generalized Anxiety Score 06/04/2016 09/25/2015  Nervous, Anxious, on Edge 3 3  Control/stop worrying 3 3  Worry too much - different things 3 3  Trouble relaxing 2 3  Restless 2 3  Easily annoyed or irritable 1 3  Afraid - awful might happen 3 3  Total GAD 7 Score 17 21  Anxiety Difficulty Somewhat difficult Very difficult   Chest pain is sharp. It hurts worse when she takes a deep breath. Pain is located at the central chest. She is not short of breath but it hurts when she takes a deep breath. History Amber Stephenson has a past medical history of Atrial fibrillation ([place]); Bipolar affective ([place]); CHF (congestive heart failure) ([place]); Chronic back pain; Diabetes mellitus without complication ([place]); Hyperlipidemia; Hypertension; Pain management; and Renal insufficiency.   She has a past surgical history that includes THIGH SURGERY; Shoulder surgery (Right); and Breast reduction surgery.   Her family history includes Diabetes in her brother and mother; Heart disease in her brother, mother, and sister; Mental illness in her brother.She reports that she has never smoked. She has never used smokeless tobacco. She reports that she does not drink alcohol or use drugs.    ROS Review of Systems  Constitutional: Negative for activity change, appetite change and fever.  HENT: Negative for congestion, rhinorrhea and sore throat.   Eyes: Negative for visual disturbance.  Respiratory: Positive for cough and chest tightness. Negative for shortness of breath.   Cardiovascular:  Negative for chest pain and palpitations.  Gastrointestinal: Negative for abdominal pain, diarrhea and nausea.  Genitourinary: Negative for dysuria.  Musculoskeletal: Negative for arthralgias and myalgias.    Objective:  BP (!) 146/88   Pulse 96   Temp 97.2 F (36.2 C) (Oral)   Ht 5\' 2"  (1.575 m)   Wt 195 lb (88.5 kg)   SpO2 96%   BMI 35.67 kg/m   BP Readings from Last 3 Encounters:  06/04/16 (!) 146/88  04/22/16 (!) 156/96  03/17/16 110/73    Wt Readings from Last 3 Encounters:  06/04/16 195 lb (88.5 kg)  04/22/16 188 lb 12.8 oz (85.6 kg)  03/17/16 197 lb 3.2 oz (89.4 kg)     Physical Exam  Constitutional: She is oriented to person, place, and time. She appears well-developed and well-nourished. No distress.  HENT:  Head: Normocephalic and atraumatic.  Eyes: Conjunctivae are normal. Pupils are equal, round, and reactive to light.  Neck: Normal range of motion. Neck supple. No thyromegaly present.  Cardiovascular: Normal rate, regular rhythm and normal heart sounds.   No murmur heard. Pulmonary/Chest: Effort normal and breath sounds normal. No respiratory distress. She has no wheezes. She has no rales.  Abdominal: Soft. Bowel sounds are normal. She exhibits no distension. There is no tenderness.  Musculoskeletal: Normal range of motion.  Lymphadenopathy:    She has no cervical adenopathy.  Neurological: She is alert and oriented to person, place, and time.  Skin: Skin is warm and dry.  Psychiatric: She has  a normal mood and affect. Her behavior is normal. Judgment and thought content normal.     Lab Results  Component Value Date   WBC 8.3 03/18/2016   HGB 15.0 10/01/2015   HCT 36.4 03/18/2016   PLT 262 03/18/2016   GLUCOSE 141 (H) 03/25/2016   CHOL 163 01/04/2015   TRIG 249 (H) 01/04/2015   HDL 42 01/04/2015   LDLDIRECT 62 03/17/2016   LDLCALC 71 01/04/2015   ALT 9 03/25/2016   AST 14 03/25/2016   NA 143 03/25/2016   K 4.6 03/25/2016   CL 103  03/25/2016   CREATININE 1.52 (H) 03/25/2016   BUN 31 (H) 03/25/2016   CO2 26 03/25/2016   TSH 3.550 01/04/2015   HGBA1C 9.5 (H) 10/01/2015   MICROALBUR 1.9 07/23/2015    Ct Shoulder Right Wo Contrast  Result Date: 04/10/2016 CLINICAL DATA:  Nonspecific (abnormal) findings on radiological and other examination of musculoskeletal system. Right shoulder pain, question pain location. Surgery: right shoulder 5 years ago. EXAM: CT OF THE RIGHT SHOULDER WITHOUT INTRAVENOUS CONTRAST. 3-DIMENSIONAL CT IMAGE RENDERING ON INDEPENDENT WORKSTATION TECHNIQUE: Multiple axial images of the right shoulder were performed obtained. Sagittal and coronal reformatted images were performed. 3-dimensional CT images were rendered by post-processing of the original CT data on an independent workstation. The 3-dimensional CT images were interpreted and findings were reported in the accompanying complete CT report for this study COMPARISON:  None. FINDINGS: Bones/Joint/Cartilage No acute fracture or dislocation. 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 Moderate degenerative changes of the acromioclavicular joint. Type I acromion. The Healed fracture of the surgical neck of the right proximal humerus with posttraumatic deformity. Single screw transfixing the proximal humeral diaphysis without failure or complication. No backing out of the screw. 12 mm of the distal screw extend beyond the posterior cortex into the soft tissues. Normal alignment. No joint effusion. Ligaments Ligaments are suboptimally evaluated by CT. Muscles and Tendons Muscles are normal.  No significant muscle atrophy. Soft tissue No fluid collection or hematoma.  No soft tissue mass. IMPRESSION: 1. Healed fracture of the surgical neck of the right proximal humerus with posttraumatic deformity. Single screw transfixing the proximal humeral diaphysis without failure or complication. No backing out of the screw. 12 mm of the distal screw extend beyond the  posterior cortex into the soft tissues. Electronically Signed   By: Kathreen Devoid   On: 04/10/2016 13:42   Ct 3d Independent Darreld Mclean  Result Date: 04/10/2016 CLINICAL DATA:  Nonspecific (abnormal) findings on radiological and other examination of musculoskeletal system. Right shoulder pain, question pain location. Surgery: right shoulder 5 years ago. EXAM: CT OF THE RIGHT SHOULDER WITHOUT INTRAVENOUS CONTRAST. 3-DIMENSIONAL CT IMAGE RENDERING ON INDEPENDENT WORKSTATION TECHNIQUE: Multiple axial images of the right shoulder were performed obtained. Sagittal and coronal reformatted images were performed. 3-dimensional CT images were rendered by post-processing of the original CT data on an independent workstation. The 3-dimensional CT images were interpreted and findings were reported in the accompanying complete CT report for this study COMPARISON:  None. FINDINGS: Bones/Joint/Cartilage No acute fracture or dislocation. 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 Moderate degenerative changes of the acromioclavicular joint. Type I acromion. The Healed fracture of the surgical neck of the right proximal humerus with posttraumatic deformity. Single screw transfixing the proximal humeral diaphysis without failure or complication. No backing out of the screw. 12 mm of the distal screw extend beyond the posterior cortex into the soft tissues. Normal alignment. No joint effusion. Ligaments Ligaments are  suboptimally evaluated by CT. Muscles and Tendons Muscles are normal.  No significant muscle atrophy. Soft tissue No fluid collection or hematoma.  No soft tissue mass. IMPRESSION: 1. Healed fracture of the surgical neck of the right proximal humerus with posttraumatic deformity. Single screw transfixing the proximal humeral diaphysis without failure or complication. No backing out of the screw. 12 mm of the distal screw extend beyond the posterior cortex into the soft tissues. Electronically Signed   By: Kathreen Devoid   On: 04/10/2016 13:42    Assessment & Plan:   Amber Stephenson was seen today for cough.  Diagnoses and all orders for this visit:  [place] Chest 2 View; Future  Other orders -     ALPRAZolam (XANAX) 0.25 MG tablet; Take 1 tablet (0.25 mg total) by mouth 2 (two) times daily as needed for anxiety. -     DULoxetine (CYMBALTA) 30 MG capsule; Take 1 capsule (30 mg total) by mouth daily. For one week then two daily. Take with a full stomach at suppertime     I have discontinued Amber Stephenson's niacin and mupirocin ointment. I am also having her start on ALPRAZolam and DULoxetine. Additionally, I am having her maintain her apixaban, Vitamin D3, potassium chloride, glucose blood, FREESTYLE LITE, freestyle, oxyCODONE-acetaminophen, Insulin Pen Needle, magnesium, lisinopril, lidocaine, metFORMIN, zaleplon, Exenatide ER, metoprolol, SUMAtriptan, pregabalin, atorvastatin, TRICOR, rizatriptan, temazepam, ARIPiprazole, furosemide, and tizanidine.  Meds ordered this encounter  Medications  . ALPRAZolam (XANAX) 0.25 MG tablet    Sig: Take 1 tablet (0.25 mg total) by mouth 2 (two) times daily as needed for anxiety.    Dispense:  20 tablet    Refill:  0  . DULoxetine (CYMBALTA) 30 MG capsule    Sig: Take 1 capsule (30 mg total) by mouth daily. For one week then two daily. Take with a full stomach at suppertime    Dispense:  60 capsule    Refill:  0     Follow-up: Return in about 1 month (around 07/05/2016).  [name], M.D.

## 2016-06-06 ENCOUNTER — Ambulatory Visit: Payer: Medicare Other | Admitting: Family Medicine

## 2016-06-09 ENCOUNTER — Encounter: Payer: Self-pay | Admitting: Family Medicine

## 2016-06-09 DIAGNOSIS — T8484XA Pain due to internal orthopedic prosthetic devices, implants and grafts, initial encounter: Secondary | ICD-10-CM | POA: Diagnosis not present

## 2016-06-09 DIAGNOSIS — Z978 Presence of other specified devices: Secondary | ICD-10-CM | POA: Diagnosis not present

## 2016-06-09 DIAGNOSIS — M75111 Incomplete rotator cuff tear or rupture of right shoulder, not specified as traumatic: Secondary | ICD-10-CM | POA: Diagnosis not present

## 2016-06-09 DIAGNOSIS — M24511 Contracture, right shoulder: Secondary | ICD-10-CM | POA: Diagnosis not present

## 2016-06-09 DIAGNOSIS — M24111 Other articular cartilage disorders, right shoulder: Secondary | ICD-10-CM | POA: Diagnosis not present

## 2016-06-09 DIAGNOSIS — M94211 Chondromalacia, right shoulder: Secondary | ICD-10-CM | POA: Diagnosis not present

## 2016-06-09 DIAGNOSIS — G8918 Other acute postprocedural pain: Secondary | ICD-10-CM | POA: Diagnosis not present

## 2016-06-09 DIAGNOSIS — T8489XA Other specified complication of internal orthopedic prosthetic devices, implants and grafts, initial encounter: Secondary | ICD-10-CM | POA: Diagnosis not present

## 2016-06-09 DIAGNOSIS — M7501 Adhesive capsulitis of right shoulder: Secondary | ICD-10-CM | POA: Diagnosis not present

## 2016-06-09 DIAGNOSIS — M25511 Pain in right shoulder: Secondary | ICD-10-CM | POA: Diagnosis not present

## 2016-06-12 ENCOUNTER — Other Ambulatory Visit: Payer: Self-pay | Admitting: Endocrinology

## 2016-06-12 ENCOUNTER — Ambulatory Visit: Payer: Medicare Other | Attending: Orthopedic Surgery | Admitting: Physical Therapy

## 2016-06-12 ENCOUNTER — Ambulatory Visit: Payer: Medicare Other | Admitting: Family Medicine

## 2016-06-12 DIAGNOSIS — M25611 Stiffness of right shoulder, not elsewhere classified: Secondary | ICD-10-CM | POA: Insufficient documentation

## 2016-06-12 DIAGNOSIS — M25511 Pain in right shoulder: Secondary | ICD-10-CM | POA: Insufficient documentation

## 2016-06-12 NOTE — Therapy (Signed)
[facility] [place], [place], [address] Phone: [phone]   Fax:  [phone]  Physical Therapy Evaluation  Patient Details  Name: Amber Stephenson MRN: [id number] Date of Birth: 1946/05/29 Referring Provider: Netta Cedars MD.  Encounter Date: 06/12/2016      PT End of Session - 06/12/16 1425    PT Start Time 0140   PT Stop Time 0222   PT Time Calculation (min) 42 min   Activity Tolerance Patient tolerated treatment well   Behavior During Therapy [facility] for tasks assessed/performed      Past Medical History:  Diagnosis Date  . Atrial fibrillation ([place])   . Bipolar affective ([place])   . CHF (congestive heart failure) ([place])   . Chronic back pain   . Diabetes mellitus without complication ([place])   . Hyperlipidemia   . Hypertension   . Pain management   . Renal insufficiency     Past Surgical History:  Procedure Laterality Date  . BREAST REDUCTION SURGERY    . SHOULDER SURGERY Right   . THIGH SURGERY      There were no vitals filed for this visit.       Subjective Assessment - 06/12/16 1406    Subjective The patient sustained a right shoulder injury years ago as the result of a fall and had an ORIF.  Over the years her pain got worse.  She underwent a a right shoulder arthroscopic surgery on 06/09/16.  She is performing the pedulum; "hichhiker" and thigh slide exercise.  She removes her bulky dressing tomorrow.  Her pain-level is an 8/10 and she is in a sling currently.   Patient Stated Goals Regain use of my right shoulder.   Currently in Pain? Yes   Pain Score 8    Pain Location Shoulder   Pain Orientation Right   Pain Descriptors / Indicators Aching;Throbbing   Pain Type Surgical pain   Pain Onset In the past 7 days   Pain Frequency Constant   Aggravating Factors  Movement.   Pain Relieving Factors Rest.            [facility] PT Assessment - 06/12/16 0001      Assessment   Medical Diagnosis S/p right  shoulder arthoscopy.   Referring Provider Netta Cedars MD.   Onset Date/Surgical Date --  06/09/16.     Precautions   Precautions --  Begin with right shoulder PAROM.     Restrictions   Weight Bearing Restrictions No     Balance Screen   Has the patient fallen in the past 6 months No   Has the patient had a decrease in activity level because of a fear of falling?  No   Is the patient reluctant to leave their home because of a fear of falling?  No     Home Environment   Living Environment Private residence     Prior Function   Level of Independence Independent     Observation/Other Assessments   Observations Bulky dressing donned right shoulder.     Posture/Postural Control   Posture Comments Guarded.     ROM / Strength   AROM / PROM / Strength PROM     PROM   Overall PROM Comments PAROM into right shoulder flexion (in supine)= 57 degrees; ER= 3 degrees and IR to abdomen.     Palpation   Palpation comment C/O diffuse right shoulder pain over bulky dressing.     Ambulation/Gait   Gait Comments Slow and  purposeful with right shoulder sling donned.                   [facility] Adult PT Treatment/Exercise - 06/12/16 0001      Manual Therapy   Manual Therapy Passive ROM   Passive ROM PAROM in supine to right shoulder into right shoulder flexion and ER x 8 minutes.                  PT Short Term Goals - 06/12/16 1417      PT SHORT TERM GOAL #1   Title STG's=LTG's.           PT Long Term Goals - 06/12/16 1418      PT LONG TERM GOAL #1   Title Independent with an HEP.   Time 8   Period Weeks   Status New     PT LONG TERM GOAL #2   Title Active right shoulder flexion to 150 degrees so the patient can easily reach overhead.   Time 8   Period Weeks   Status New     PT LONG TERM GOAL #3   Title Active ER to 70 degrees+ to allow for easily donning/doffing of apparel   Time 8   Period Weeks   Status New     PT LONG TERM GOAL #4   Title  Increase right shoulder strength to a solid 4+/5 to increase stability for performance of functional activities   Time 8   Period Weeks   Status New     PT LONG TERM GOAL #5   Title Perform ADL's with pain not > 3/10.   Time 8   Period Weeks   Status New               Plan - 06/12/16 1411    Clinical Impression Statement The patient has severely limited right shoulder movement currently as expected.  She is also in a lot of pain bu t is complaint to her medication usage.  She is currently in a right shoulder sling.   Rehab Potential Good   PT Frequency 3x / week   PT Duration 4 weeks   PT Treatment/Interventions ADLs/Self Care Home Management;Electrical Stimulation;Moist Heat;Ultrasound;Patient/family education;Neuromuscular re-education;Therapeutic exercise;Therapeutic activities;Manual techniques;Passive range of motion;Vasopneumatic Device   PT Next Visit Plan Passive-assisitve right shoulder ROM; vasopneumatic on low.      Patient will benefit from skilled therapeutic intervention in order to improve the following deficits and impairments:  Pain, Decreased activity tolerance  Visit Diagnosis: Acute pain of right shoulder - Plan: PT plan of care cert/re-cert  Stiffness of right shoulder, not elsewhere classified - Plan: PT plan of care cert/re-cert      G-Codes - 0000000 1404    Functional Assessment Tool Used FOTO 56% limitation.   Functional Limitation Carrying, moving and handling objects   Carrying, Moving and Handling Objects Current Status [id number]) At least 40 percent but less than 60 percent impaired, limited or restricted   Carrying, Moving and Handling Objects Goal Status [id number]) At least 1 percent but less than 20 percent impaired, limited or restricted       Problem List Patient Active Problem List   Diagnosis Date Noted  . Healthcare maintenance 02/12/2016  . Migraine headache with aura 02/12/2016  . Diabetic neuropathy ([place]) 02/06/2016  .  Depression   . Encounter for preadmission testing   . Herpes genitalis in women 07/16/2015  . Genital lesion, female 07/12/2015  . Chronic anticoagulation 05/30/2015  .  Family history of coronary artery disease in sister 05/30/2015  . Acute on chronic diastolic CHF (congestive heart failure), NYHA class 1 ([place]) 05/30/2015  . Chest pain with moderate risk of acute coronary syndrome 05/29/2015  . Pain in the chest   . Dyspnea   . Essential hypertension   . RLS (restless legs syndrome) 04/27/2015  . Fall 02/26/2015  . Back pain 02/26/2015  . Lipoma 02/08/2015  . Bipolar disorder ([place]) 01/23/2015  . Insomnia 01/23/2015  . CKD stage 3 due to type 2 diabetes mellitus ([place]) 01/05/2015  . Chronic back pain 01/04/2015  . Permanent atrial fibrillation ([place]) 01/04/2015  . Pruritus 01/04/2015  . Diabetes mellitus with stage 2 chronic kidney disease ([place])   . Hyperlipidemia     Rhonda Linan, [place] MPT 06/12/2016, 2:26 PM  [facility] [address] [place], [place], [address] Phone: [phone]   Fax:  [phone]  Name: Amber Stephenson MRN: [id number] Date of Birth: 1946-02-19

## 2016-06-13 ENCOUNTER — Telehealth: Payer: Self-pay | Admitting: Family Medicine

## 2016-06-16 ENCOUNTER — Ambulatory Visit: Payer: Medicare Other | Admitting: Physical Therapy

## 2016-06-16 DIAGNOSIS — M25611 Stiffness of right shoulder, not elsewhere classified: Secondary | ICD-10-CM

## 2016-06-16 DIAGNOSIS — M25511 Pain in right shoulder: Secondary | ICD-10-CM

## 2016-06-16 NOTE — Therapy (Signed)
[facility] [place], [place], [address] Phone: [phone]   Fax:  [phone]  Physical Therapy Treatment  Patient Details  Name: Amber Stephenson MRN: [id number] Date of Birth: 04-07-1946 Referring Provider: Netta Cedars MD.  Encounter Date: 06/16/2016      PT End of Session - 06/16/16 1521    Visit Number 2   Number of Visits 12   Date for PT Re-Evaluation 08/11/16   PT Start Time 0230   PT Stop Time 0327   PT Time Calculation (min) 57 min   Activity Tolerance Patient tolerated treatment well   Behavior During Therapy [facility] for tasks assessed/performed      Past Medical History:  Diagnosis Date  . Atrial fibrillation ([place])   . Bipolar affective ([place])   . CHF (congestive heart failure) ([place])   . Chronic back pain   . Diabetes mellitus without complication ([place])   . Hyperlipidemia   . Hypertension   . Pain management   . Renal insufficiency     Past Surgical History:  Procedure Laterality Date  . BREAST REDUCTION SURGERY    . SHOULDER SURGERY Right   . THIGH SURGERY      There were no vitals filed for this visit.      Subjective Assessment - 06/16/16 1522    Subjective I did my home exercises.   Pain Score 7    Pain Location Shoulder   Pain Orientation Right   Pain Descriptors / Indicators Aching;Throbbing   Pain Type Surgical pain   Pain Onset In the past 7 days   Pain Frequency Constant                         [facility] Adult PT Treatment/Exercise - 06/16/16 0001      Exercises   Exercises Shoulder     Modalities   Modalities Vasopneumatic     Vasopneumatic   Number Minutes Vasopneumatic  12 minutes   Vasopnuematic Location  --  RT SHOULDER.   Vasopneumatic Pressure Medium     Manual Therapy   Manual Therapy Passive ROM   Passive ROM PAROM x 38 minutes in supine into flexion and ER.                  PT Short Term Goals - 06/12/16 1417      PT SHORT TERM  GOAL #1   Title STG's=LTG's.           PT Long Term Goals - 06/12/16 1418      PT LONG TERM GOAL #1   Title Independent with an HEP.   Time 8   Period Weeks   Status New     PT LONG TERM GOAL #2   Title Active right shoulder flexion to 150 degrees so the patient can easily reach overhead.   Time 8   Period Weeks   Status New     PT LONG TERM GOAL #3   Title Active ER to 70 degrees+ to allow for easily donning/doffing of apparel   Time 8   Period Weeks   Status New     PT LONG TERM GOAL #4   Title Increase right shoulder strength to a solid 4+/5 to increase stability for performance of functional activities   Time 8   Period Weeks   Status New     PT LONG TERM GOAL #5   Title Perform ADL's with pain not > 3/10.  Time 8   Period Weeks   Status New             Patient will benefit from skilled therapeutic intervention in order to improve the following deficits and impairments:  Pain, Decreased activity tolerance  Visit Diagnosis: Acute pain of right shoulder  Stiffness of right shoulder, not elsewhere classified     Problem List Patient Active Problem List   Diagnosis Date Noted  . Healthcare maintenance 02/12/2016  . Migraine headache with aura 02/12/2016  . Diabetic neuropathy ([place]) 02/06/2016  . Depression   . Encounter for preadmission testing   . Herpes genitalis in women 07/16/2015  . Genital lesion, female 07/12/2015  . Chronic anticoagulation 05/30/2015  . Family history of coronary artery disease in sister 05/30/2015  . Acute on chronic diastolic CHF (congestive heart failure), NYHA class 1 ([place]) 05/30/2015  . Chest pain with moderate risk of acute coronary syndrome 05/29/2015  . Pain in the chest   . Dyspnea   . Essential hypertension   . RLS (restless legs syndrome) 04/27/2015  . Fall 02/26/2015  . Back pain 02/26/2015  . Lipoma 02/08/2015  . Bipolar disorder ([place]) 01/23/2015  . Insomnia 01/23/2015  . CKD stage 3 due to type 2  diabetes mellitus ([place]) 01/05/2015  . Chronic back pain 01/04/2015  . Permanent atrial fibrillation ([place]) 01/04/2015  . Pruritus 01/04/2015  . Diabetes mellitus with stage 2 chronic kidney disease ([place])   . Hyperlipidemia     Camelle Henkels, [place] MPT 06/16/2016, 3:31 PM  [facility] [address] [place], [place], [address] Phone: [phone]   Fax:  [phone]  Name: Amber Stephenson MRN: [id number] Date of Birth: [birth year]

## 2016-06-17 ENCOUNTER — Other Ambulatory Visit (HOSPITAL_COMMUNITY): Payer: Self-pay

## 2016-06-17 MED ORDER — ZALEPLON 10 MG PO CAPS: ORAL_CAPSULE | ORAL | 0 refills | Status: DC

## 2016-06-17 NOTE — Telephone Encounter (Signed)
Spoke to pt apt was suppose to been canceled on 06/05/16.

## 2016-06-19 ENCOUNTER — Encounter: Payer: Self-pay | Admitting: *Deleted

## 2016-06-19 DIAGNOSIS — Z4789 Encounter for other orthopedic aftercare: Secondary | ICD-10-CM | POA: Diagnosis not present

## 2016-06-19 DIAGNOSIS — T8484XA Pain due to internal orthopedic prosthetic devices, implants and grafts, initial encounter: Secondary | ICD-10-CM | POA: Diagnosis not present

## 2016-06-23 ENCOUNTER — Ambulatory Visit: Payer: Medicare Other | Admitting: Physical Therapy

## 2016-06-23 ENCOUNTER — Encounter: Payer: Self-pay | Admitting: Physical Therapy

## 2016-06-23 DIAGNOSIS — M25511 Pain in right shoulder: Secondary | ICD-10-CM | POA: Diagnosis not present

## 2016-06-23 DIAGNOSIS — M25611 Stiffness of right shoulder, not elsewhere classified: Secondary | ICD-10-CM | POA: Diagnosis not present

## 2016-06-23 NOTE — Therapy (Signed)
[facility] [place], [place], [address] Phone: [phone]   Fax:  [phone]  Physical Therapy Treatment  Patient Details  Name: Amber Stephenson MRN: [id number] Date of Birth: [birth year] Referring Provider: Netta Cedars MD.  Encounter Date: 06/23/2016      PT End of Session - 06/23/16 1341    Visit Number 3   Number of Visits 12   Date for PT Re-Evaluation 08/11/16   PT Start Time 1314   PT Stop Time 1357   PT Time Calculation (min) 43 min   Activity Tolerance Patient tolerated treatment well   Behavior During Therapy [facility] for tasks assessed/performed      Past Medical History:  Diagnosis Date  . Atrial fibrillation ([place])   . Bipolar affective ([place])   . CHF (congestive heart failure) ([place])   . Chronic back pain   . Diabetes mellitus without complication ([place])   . Hyperlipidemia   . Hypertension   . Pain management   . Renal insufficiency     Past Surgical History:  Procedure Laterality Date  . BREAST REDUCTION SURGERY    . SHOULDER SURGERY Right   . THIGH SURGERY      There were no vitals filed for this visit.      Subjective Assessment - 06/23/16 1317    Subjective Patient reported ongoing high pain in shoulder   Patient Stated Goals Regain use of my right shoulder.   Currently in Pain? Yes   Pain Score 7    Pain Location Shoulder   Pain Orientation Right   Pain Descriptors / Indicators Aching;Throbbing   Pain Type Surgical pain   Pain Onset 1 to 4 weeks ago   Pain Frequency Constant   Aggravating Factors  movement   Pain Relieving Factors at rest             [facility] PT Assessment - 06/23/16 0001      ROM / Strength   AROM / PROM / Strength PROM     PROM   Overall PROM Comments PAROM    PROM Assessment Site Shoulder   Right/Left Shoulder Right   Right Shoulder Flexion 150 Degrees   Right Shoulder External Rotation 23 Degrees                     [facility] Adult PT  Treatment/Exercise - 06/23/16 0001      Vasopneumatic   Number Minutes Vasopneumatic  15 minutes   Vasopnuematic Location  Shoulder   Vasopneumatic Pressure Low     Manual Therapy   Manual Therapy Passive ROM   Passive ROM PAROM in supine into flexion and ER.                  PT Short Term Goals - 06/12/16 1417      PT SHORT TERM GOAL #1   Title STG's=LTG's.           PT Long Term Goals - 06/23/16 1342      PT LONG TERM GOAL #1   Title Independent with an HEP.   Time 8   Period Weeks   Status On-going     PT LONG TERM GOAL #2   Title Active right shoulder flexion to 150 degrees so the patient can easily reach overhead.   Time 8   Period Weeks   Status On-going     PT LONG TERM GOAL #3   Title Active ER to 70 degrees+ to allow for  easily donning/doffing of apparel   Time 8   Period Weeks   Status On-going     PT LONG TERM GOAL #4   Title Increase right shoulder strength to a solid 4+/5 to increase stability for performance of functional activities   Time 8   Period Weeks   Status On-going     PT LONG TERM GOAL #5   Title Perform ADL's with pain not > 3/10.   Time 8   Period Weeks   Status On-going               Plan - 06/23/16 1343    Clinical Impression Statement Patient tolerated treatment well today. Patient has ongoing high pain with meds. Patient has some guarding with ROM and requires ossilations to relax. Patient has improved PAAROM today for both right shoulder flexion and ER. Patient current goals ongoing due to ROM, strength and pain deficts.   Rehab Potential Good   Clinical Impairments Affecting Rehab Potential surgery 06/09/16 current 2 weeks 06/23/16   PT Frequency 3x / week   PT Duration 4 weeks   PT Treatment/Interventions ADLs/Self Care Home Management;Electrical Stimulation;Moist Heat;Ultrasound;Patient/family education;Neuromuscular re-education;Therapeutic exercise;Therapeutic activities;Manual techniques;Passive range of  motion;Vasopneumatic Device   PT Next Visit Plan Passive-assisitve right shoulder ROM; start pulley's per MPT per patient tolerance,vasopneumatic on low. (MD. Veverly Fells 07/20/16)   Consulted and Agree with Plan of Care Patient      Patient will benefit from skilled therapeutic intervention in order to improve the following deficits and impairments:  Pain, Decreased activity tolerance  Visit Diagnosis: Acute pain of right shoulder  Stiffness of right shoulder, not elsewhere classified     Problem List Patient Active Problem List   Diagnosis Date Noted  . Healthcare maintenance 02/12/2016  . Migraine headache with aura 02/12/2016  . Diabetic neuropathy ([place]) 02/06/2016  . Depression   . Encounter for preadmission testing   . Herpes genitalis in women 07/16/2015  . Genital lesion, female 07/12/2015  . Chronic anticoagulation 05/30/2015  . Family history of coronary artery disease in sister 05/30/2015  . Acute on chronic diastolic CHF (congestive heart failure), NYHA class 1 ([place]) 05/30/2015  . Chest pain with moderate risk of acute coronary syndrome 05/29/2015  . Pain in the chest   . Dyspnea   . Essential hypertension   . RLS (restless legs syndrome) 04/27/2015  . Fall 02/26/2015  . Back pain 02/26/2015  . Lipoma 02/08/2015  . Bipolar disorder ([place]) 01/23/2015  . Insomnia 01/23/2015  . CKD stage 3 due to type 2 diabetes mellitus ([place]) 01/05/2015  . Chronic back pain 01/04/2015  . Permanent atrial fibrillation ([place]) 01/04/2015  . Pruritus 01/04/2015  . Diabetes mellitus with stage 2 chronic kidney disease ([place])   . Hyperlipidemia     Deddrick Saindon P, PTA 06/23/2016, 1:59 PM  [facility] [place], [place], [address] Phone: [phone]   Fax:  [phone]  Name: Litasha Foulks MRN: [id number] Date of Birth: [birth year]

## 2016-06-24 ENCOUNTER — Ambulatory Visit (INDEPENDENT_AMBULATORY_CARE_PROVIDER_SITE_OTHER): Payer: Medicare Other | Admitting: Psychiatry

## 2016-06-24 VITALS — BP 130/82 | HR 72 | Resp 12 | Ht 62.0 in | Wt 191.6 lb

## 2016-06-24 DIAGNOSIS — Z794 Long term (current) use of insulin: Secondary | ICD-10-CM

## 2016-06-24 DIAGNOSIS — Z79899 Other long term (current) drug therapy: Secondary | ICD-10-CM | POA: Diagnosis not present

## 2016-06-24 DIAGNOSIS — Z818 Family history of other mental and behavioral disorders: Secondary | ICD-10-CM | POA: Diagnosis not present

## 2016-06-24 DIAGNOSIS — Z833 Family history of diabetes mellitus: Secondary | ICD-10-CM

## 2016-06-24 DIAGNOSIS — F3160 Bipolar disorder, current episode mixed, unspecified: Secondary | ICD-10-CM

## 2016-06-24 DIAGNOSIS — Z9889 Other specified postprocedural states: Secondary | ICD-10-CM | POA: Diagnosis not present

## 2016-06-24 DIAGNOSIS — Z79891 Long term (current) use of opiate analgesic: Secondary | ICD-10-CM

## 2016-06-24 DIAGNOSIS — Z8249 Family history of ischemic heart disease and other diseases of the circulatory system: Secondary | ICD-10-CM

## 2016-06-24 MED ORDER — TEMAZEPAM 15 MG PO CAPS: ORAL_CAPSULE | ORAL | 4 refills | Status: DC

## 2016-06-24 MED ORDER — ARIPIPRAZOLE 10 MG PO TABS: ORAL_TABLET | ORAL | 8 refills | Status: DC

## 2016-06-24 MED ORDER — ZALEPLON 10 MG PO CAPS: ORAL_CAPSULE | ORAL | 3 refills | Status: DC

## 2016-06-24 NOTE — Progress Notes (Signed)
Patient ID: Amber Stephenson, female   DOB: March 11, 1946, 71 y.o.   MRN: [id number] [facility] MD Progress Note  06/24/2016 2:05 PM Veronica Denault  MRN:  [id number] Subjective:  Doing well Principal Problem: Bipolar disorder most recent episode manic Diagnosis:  Bipolar disorder most recent  Today the patient is actually doing fairly well. She's had some shoulder surgery. She seen with her husband. She says her mood is actually pretty good. She's had significant trauma or since I've seen her. Her baby sister died suddenly at the patient's daughter a 69 year old female was actually incarcerated for opiates. She now is out of jail unfortunately she lives in the state of [place]. She does have a boyfriend his watching and they seem to like him. Overall though the patient says her mood is stable. She denies being euphoric. She denies being irritable. She sleeping and eating fairly well. She takes 2 sleeping aids. Patient denies any psychotic symptoms at this time. Today the patient denies being suicidal or homicidal. She is functioning fairly well. We talked about the importance of taking her medicines. The patient claims that her Abilify at higher doses working area well. Her husband agrees. The patient was given some Cymbalta 60 mg and then increase to 120 mg. This was given after she started feeling better from the Abilify. In my pain at the get the mistake to give this bipolar patient a antidepressant.  Past Medical History:  Past Medical History:  Diagnosis Date  . Atrial fibrillation ([place])   . Bipolar affective ([place])   . CHF (congestive heart failure) ([place])   . Chronic back pain   . Diabetes mellitus without complication ([place])   . Hyperlipidemia   . Hypertension   . Pain management   . Renal insufficiency     Past Surgical History:  Procedure Laterality Date  . BREAST REDUCTION SURGERY    . SHOULDER SURGERY Right   . THIGH SURGERY     Family History:  Family History  Problem Relation Age of Onset   . Diabetes Mother   . Heart disease Mother   . Heart disease Brother   . Heart disease Sister     CABG  . Mental illness Brother   . Diabetes Brother    Family Psychiatric  History:  Social History:  History  Alcohol Use No     History  Drug Use No    Social History   Social History  . Marital status: Married    Spouse name: N/A  . Number of children: 3  . Years of education: N/A   Social History Main Topics  . Smoking status: Never Smoker  . Smokeless tobacco: Never Used  . Alcohol use No  . Drug use: No  . Sexual activity: Yes    Birth control/ protection: Post-menopausal   Other Topics Concern  . Not on file   Social History Narrative   Lives at home with husband.    Additional Social History:                         Sleep: Good  Appetite:  Good  Current Medications: Current Outpatient Prescriptions  Medication Sig Dispense Refill  . ALPRAZolam (XANAX) 0.25 MG tablet Take 1 tablet (0.25 mg total) by mouth 2 (two) times daily as needed for anxiety. 20 tablet 0  . apixaban (ELIQUIS) 5 MG TABS tablet Take 5 mg by mouth 2 (two) times daily.    . ARIPiprazole (ABILIFY) 10 MG  tablet 2 qam 60 tablet 8  . atorvastatin (LIPITOR) 40 MG tablet TAKE 1 TABLET DAILY 90 tablet 0  . Blood Glucose Monitoring Suppl (FREESTYLE LITE) DEVI Use to check blood sugar tid. DX E11.22 1 each 0  . Cholecalciferol (VITAMIN D3) 1000 UNITS CAPS Take 1,000 Units by mouth daily.    . DULoxetine (CYMBALTA) 30 MG capsule Take 1 capsule (30 mg total) by mouth daily. For one week then two daily. Take with a full stomach at suppertime 60 capsule 0  . Exenatide ER (BYDUREON) 2 MG PEN Inject the contents of one pen once per week 3 each 1  . furosemide (LASIX) 20 MG tablet Take 1 tablet (20 mg total) by mouth 2 (two) times daily. 180 tablet 1  . glucose blood (FREESTYLE LITE) test strip Test tid. DX E11.22 100 each 5  . Insulin Pen Needle (SURE COMFORT PEN NEEDLES) 32G X 4 MM MISC Use  with insulin pens as directed. Dx E11.9 300 each 11  . Lancets (FREESTYLE) lancets Test tid. DX E11.22 100 each 5  . LANTUS SOLOSTAR 100 UNIT/ML Solostar Pen INJECT 38 UNITS UNDER THE SKIN TWICE A DAY 90 mL 2  . lidocaine (LIDODERM) 5 % Place 1 patch onto the skin daily. 12 hours on 12 hours off 90 patch 5  . lisinopril (PRINIVIL,ZESTRIL) 5 MG tablet Take 5 mg by mouth daily.    . magnesium 30 MG tablet Take 250 mg by mouth daily.    . metFORMIN (GLUCOPHAGE XR) 500 MG 24 hr tablet Take 2 tablets in the am and 2 tablets in the pm 360 tablet 1  . metoprolol (LOPRESSOR) 100 MG tablet Take 1 tablet (100 mg total) by mouth 2 (two) times daily. 180 tablet 3  . oxyCODONE-acetaminophen (PERCOCET) 10-325 MG tablet TAKE ONE TABLET BY MOUTH FOUR TIMES A DAY AS NEEDED FOR 30 DAYS  0  . potassium chloride (K-DUR,KLOR-CON) 10 MEQ tablet Take 10 meq once daily  0  . pregabalin (LYRICA) 100 MG capsule Take 1 capsule (100 mg total) by mouth 2 (two) times daily. 180 capsule 1  . rizatriptan (MAXALT-MLT) 10 MG disintegrating tablet DISSOLVE 1 TABLET UNDER TONGUE IMMEDIATELY, MAY REPEAT DOSE IN 1 HOUR IF NEEDED- MAX OF 3 TABLETS IN 24 HOURS 10 tablet 0  . SUMAtriptan (IMITREX) 100 MG tablet Take one at onset of HA. May repeat in 2 hours if headache persists or recurs. Limit two per 24 hours 10 tablet 11  . temazepam (RESTORIL) 15 MG capsule 2  qhs 60 capsule 4  . tizanidine (ZANAFLEX) 6 MG capsule TAKE 1 CAPSULE THREE TIMES A DAY AS NEEDED FOR MUSCLE SPASMS 42 capsule 3  . TRICOR 145 MG tablet TAKE 1 TABLET DAILY 90 tablet 0  . zaleplon (SONATA) 10 MG capsule 2  qhs 60 capsule 3   No current facility-administered medications for this visit.     Lab Results: No results found for this or any previous visit (from the past 48 hour(s)).  Blood Alcohol level:  Lab Results  Component Value Date   ETH <5 10/01/2015    Physical Findings: AIMS:  , ,  ,  ,    CIWA:    COWS:     Musculoskeletal: Strength & Muscle  Tone: within normal limits Gait & Station: normal Patient leans: N/A  Psychiatric Specialty Exam: ROS  Blood pressure 130/82, pulse 72, resp. rate 12, height 5\' 2"  (1.575 m), weight 191 lb 9.6 oz (86.9 kg).Body mass index is  35.04 kg/m.  General Appearance: Casual  Eye Contact::  Good  Speech:  Clear and Coherent  Volume:  Normal  Mood:  Euthymic  Affect:  Appropriate  Thought Process:  Coherent  Orientation:  Full (Time, Place, and Person)  Thought Content:  WDL  Suicidal Thoughts:  No  Homicidal Thoughts:  No  Memory:  NA  Judgement:  Good  Insight:  Good  Psychomotor Activity:  Normal  Concentration:  Good  Recall:  Good  Fund of Knowledge:Good  Language: Good  Akathisia:  No  Handed:  Right  AIMS (if indicated):     Assets: Good spirits   ADL's:  Intact  Cognition: WNL  Sleep:      Treatment Plan Summary:  At this time the patient continue taking the high-dose dose of Abilify. She'll slowly discontinue her Cymbalta. Patient agreed with this plan and agreed to call me if there are problems. She is stable. The possibility that a lithium in the near future is possible. The patient was given a return appointment to see me in one or 2 months. The patient denies any discomfort at all other than her right arm which is in a sling. Patient will continue taking Sonata and Restoril to help her sleep. Noted is she is not on any Xanax. She'll continue also taking her Abilify 20 mg.

## 2016-06-26 ENCOUNTER — Encounter: Payer: Self-pay | Admitting: Physical Therapy

## 2016-06-26 ENCOUNTER — Ambulatory Visit: Payer: Medicare Other | Admitting: Physical Therapy

## 2016-06-26 DIAGNOSIS — M25611 Stiffness of right shoulder, not elsewhere classified: Secondary | ICD-10-CM | POA: Diagnosis not present

## 2016-06-26 DIAGNOSIS — M25511 Pain in right shoulder: Secondary | ICD-10-CM | POA: Diagnosis not present

## 2016-06-26 NOTE — Therapy (Signed)
[facility] [place], [place], [address] Phone: [phone]   Fax:  [phone]  Physical Therapy Treatment  Patient Details  Name: Amber Stephenson MRN: [id number] Date of Birth: Aug 12, 1945 Referring Provider: Netta Cedars MD.  Encounter Date: 06/26/2016      PT End of Session - 06/26/16 1509    Visit Number 4   Number of Visits 12   Date for PT Re-Evaluation 08/11/16   PT Start Time 1520   PT Stop Time 1602   PT Time Calculation (min) 42 min   Activity Tolerance Patient tolerated treatment well   Behavior During Therapy [facility] for tasks assessed/performed      Past Medical History:  Diagnosis Date  . Atrial fibrillation ([place])   . Bipolar affective ([place])   . CHF (congestive heart failure) ([place])   . Chronic back pain   . Diabetes mellitus without complication ([place])   . Hyperlipidemia   . Hypertension   . Pain management   . Renal insufficiency     Past Surgical History:  Procedure Laterality Date  . BREAST REDUCTION SURGERY    . SHOULDER SURGERY Right   . THIGH SURGERY      There were no vitals filed for this visit.      Subjective Assessment - 06/26/16 1509    Subjective Reports shoulder is pretty good today.   Patient Stated Goals Regain use of my right shoulder.   Currently in Pain? Yes   Pain Score 6    Pain Location Shoulder   Pain Orientation Right   Pain Descriptors / Indicators Aching   Pain Type Surgical pain   Pain Onset 1 to 4 weeks ago            [facility] PT Assessment - 06/26/16 0001      Assessment   Medical Diagnosis S/p right shoulder arthoscopy.   Onset Date/Surgical Date 06/09/16   Next MD Visit 07/20/2016     Restrictions   Weight Bearing Restrictions No                     [facility] Adult PT Treatment/Exercise - 06/26/16 0001      Modalities   Modalities Moist Heat     Moist Heat Therapy   Number Minutes Moist Heat 15 Minutes   Moist Heat Location Shoulder      Manual Therapy   Manual Therapy Passive ROM   Passive ROM P/AROM of R shoulder into flex/ER/IR with gentle holds at end range                  PT Short Term Goals - 06/12/16 1417      PT SHORT TERM GOAL #1   Title STG's=LTG's.           PT Long Term Goals - 06/23/16 1342      PT LONG TERM GOAL #1   Title Independent with an HEP.   Time 8   Period Weeks   Status On-going     PT LONG TERM GOAL #2   Title Active right shoulder flexion to 150 degrees so the patient can easily reach overhead.   Time 8   Period Weeks   Status On-going     PT LONG TERM GOAL #3   Title Active ER to 70 degrees+ to allow for easily donning/doffing of apparel   Time 8   Period Weeks   Status On-going     PT LONG TERM GOAL #4  Title Increase right shoulder strength to a solid 4+/5 to increase stability for performance of functional activities   Time 8   Period Weeks   Status On-going     PT LONG TERM GOAL #5   Title Perform ADL's with pain not > 3/10.   Time 8   Period Weeks   Status On-going               Plan - 06/26/16 1552    Clinical Impression Statement Patient tolerated today's treatment well with only complaint of increased pain with P/AROM R shoulder ER. Patient able to self correct for relaxation during P/AROM today. Firm end feels noted with P/AROM of R shoulder into all directions with smooth arc of motion noted as well. Patient remains compliant with HEP and sling use per patient report. No goals have been achieved at this time secondary to deficits with ROM, strength and pain complaints. Patient denied any electrical stimulation as she reports that shoulder is still very tender but asked if she could recieve moist heat instead of cryotherapy. MPT approved for patient to recieve moist heat today as patient states that MD said she could use moist heat or cryotherapy at home.   Rehab Potential Good   Clinical Impairments Affecting Rehab Potential surgery  06/09/16 current 2 weeks 06/23/16   PT Frequency 3x / week   PT Duration 4 weeks   PT Treatment/Interventions ADLs/Self Care Home Management;Electrical Stimulation;Moist Heat;Ultrasound;Patient/family education;Neuromuscular re-education;Therapeutic exercise;Therapeutic activities;Manual techniques;Passive range of motion;Vasopneumatic Device   PT Next Visit Plan Passive-assisitve right shoulder ROM; start pulley's per MPT per patient tolerance,vasopneumatic on low. (MD. Veverly Fells 07/20/16)   Consulted and Agree with Plan of Care Patient      Patient will benefit from skilled therapeutic intervention in order to improve the following deficits and impairments:  Pain, Decreased activity tolerance  Visit Diagnosis: Acute pain of right shoulder  Stiffness of right shoulder, not elsewhere classified     Problem List Patient Active Problem List   Diagnosis Date Noted  . Healthcare maintenance 02/12/2016  . Migraine headache with aura 02/12/2016  . Diabetic neuropathy ([place]) 02/06/2016  . Depression   . Encounter for preadmission testing   . Herpes genitalis in women 07/16/2015  . Genital lesion, female 07/12/2015  . Chronic anticoagulation 05/30/2015  . Family history of coronary artery disease in sister 05/30/2015  . Acute on chronic diastolic CHF (congestive heart failure), NYHA class 1 ([place]) 05/30/2015  . Chest pain with moderate risk of acute coronary syndrome 05/29/2015  . Pain in the chest   . Dyspnea   . Essential hypertension   . RLS (restless legs syndrome) 04/27/2015  . Fall 02/26/2015  . Back pain 02/26/2015  . Lipoma 02/08/2015  . Bipolar disorder ([place]) 01/23/2015  . Insomnia 01/23/2015  . CKD stage 3 due to type 2 diabetes mellitus ([place]) 01/05/2015  . Chronic back pain 01/04/2015  . Permanent atrial fibrillation ([place]) 01/04/2015  . Pruritus 01/04/2015  . Diabetes mellitus with stage 2 chronic kidney disease ([place])   . Hyperlipidemia     Wynelle Fanny,  PTA 06/26/2016, 4:06 PM  [facility] [address] [place], [place], [address] Phone: [phone]   Fax:  [phone]  Name: Amber Stephenson MRN: [id number] Date of Birth: [birth year]

## 2016-06-30 ENCOUNTER — Telehealth: Payer: Self-pay | Admitting: Endocrinology

## 2016-06-30 NOTE — Telephone Encounter (Signed)
Refuse, needs to be scheduled first for follow-up with labs

## 2016-06-30 NOTE — Telephone Encounter (Signed)
Patient requesting refill on bydureon last ov 10/12/15 and no future scheduled ok to refill?

## 2016-07-01 ENCOUNTER — Ambulatory Visit: Payer: Medicare Other | Admitting: Physical Therapy

## 2016-07-01 DIAGNOSIS — M25511 Pain in right shoulder: Secondary | ICD-10-CM | POA: Diagnosis not present

## 2016-07-01 DIAGNOSIS — M25611 Stiffness of right shoulder, not elsewhere classified: Secondary | ICD-10-CM | POA: Diagnosis not present

## 2016-07-01 NOTE — Therapy (Signed)
[facility] [place], [place], [address] Phone: [phone]   Fax:  [phone]  Physical Therapy Treatment  Patient Details  Name: Rim Kleintop MRN: [id number] Date of Birth: 12-Nov-1945 Referring Provider: Netta Cedars MD.  Encounter Date: 07/01/2016      PT End of Session - 07/01/16 1533    Visit Number 5   Number of Visits 12   Date for PT Re-Evaluation 08/11/16   PT Start Time 0230   PT Stop Time 0314   PT Time Calculation (min) 44 min   Activity Tolerance Patient tolerated treatment well   Behavior During Therapy [facility] for tasks assessed/performed      Past Medical History:  Diagnosis Date  . Atrial fibrillation ([place])   . Bipolar affective ([place])   . CHF (congestive heart failure) ([place])   . Chronic back pain   . Diabetes mellitus without complication ([place])   . Hyperlipidemia   . Hypertension   . Pain management   . Renal insufficiency     Past Surgical History:  Procedure Laterality Date  . BREAST REDUCTION SURGERY    . SHOULDER SURGERY Right   . THIGH SURGERY      There were no vitals filed for this visit.      Subjective Assessment - 07/01/16 1540    Subjective I feel like I'm doing good.   Patient Stated Goals Regain use of my right shoulder.   Pain Score 5    Pain Location Shoulder   Pain Orientation Right   Pain Descriptors / Indicators Aching   Pain Type Surgical pain   Pain Onset 1 to 4 weeks ago                         [facility] Adult PT Treatment/Exercise - 07/01/16 0001      Manual Therapy   Manual Therapy Passive ROM   Passive ROM PAROM x 38 minutes to patient's right shoulder into flexion and ER in the plane of the scapula.                  PT Short Term Goals - 06/12/16 1417      PT SHORT TERM GOAL #1   Title STG's=LTG's.           PT Long Term Goals - 06/23/16 1342      PT LONG TERM GOAL #1   Title Independent with an HEP.   Time 8   Period Weeks   Status On-going     PT LONG TERM GOAL #2   Title Active right shoulder flexion to 150 degrees so the patient can easily reach overhead.   Time 8   Period Weeks   Status On-going     PT LONG TERM GOAL #3   Title Active ER to 70 degrees+ to allow for easily donning/doffing of apparel   Time 8   Period Weeks   Status On-going     PT LONG TERM GOAL #4   Title Increase right shoulder strength to a solid 4+/5 to increase stability for performance of functional activities   Time 8   Period Weeks   Status On-going     PT LONG TERM GOAL #5   Title Perform ADL's with pain not > 3/10.   Time 8   Period Weeks   Status On-going             Patient will benefit from skilled therapeutic intervention in  order to improve the following deficits and impairments:  Pain, Decreased activity tolerance, Decreased range of motion  Visit Diagnosis: Acute pain of right shoulder  Stiffness of right shoulder, not elsewhere classified     Problem List Patient Active Problem List   Diagnosis Date Noted  . Healthcare maintenance 02/12/2016  . Migraine headache with aura 02/12/2016  . Diabetic neuropathy ([place]) 02/06/2016  . Depression   . Encounter for preadmission testing   . Herpes genitalis in women 07/16/2015  . Genital lesion, female 07/12/2015  . Chronic anticoagulation 05/30/2015  . Family history of coronary artery disease in sister 05/30/2015  . Acute on chronic diastolic CHF (congestive heart failure), NYHA class 1 ([place]) 05/30/2015  . Chest pain with moderate risk of acute coronary syndrome 05/29/2015  . Pain in the chest   . Dyspnea   . Essential hypertension   . RLS (restless legs syndrome) 04/27/2015  . Fall 02/26/2015  . Back pain 02/26/2015  . Lipoma 02/08/2015  . Bipolar disorder ([place]) 01/23/2015  . Insomnia 01/23/2015  . CKD stage 3 due to type 2 diabetes mellitus ([place]) 01/05/2015  . Chronic back pain 01/04/2015  . Permanent atrial fibrillation  ([place]) 01/04/2015  . Pruritus 01/04/2015  . Diabetes mellitus with stage 2 chronic kidney disease ([place])   . Hyperlipidemia     Uriah Trueba, [place] MPT 07/01/2016, 3:50 PM  [facility] [address] [place], [place], [address] Phone: [phone]   Fax:  [phone]  Name: Felise Mcelrath MRN: [id number] Date of Birth: 03-May-1946

## 2016-07-02 ENCOUNTER — Encounter: Payer: Medicare Other | Admitting: *Deleted

## 2016-07-03 ENCOUNTER — Ambulatory Visit: Payer: Medicare Other | Attending: Orthopedic Surgery | Admitting: Physical Therapy

## 2016-07-03 ENCOUNTER — Encounter: Payer: Self-pay | Admitting: Physical Therapy

## 2016-07-03 DIAGNOSIS — M25511 Pain in right shoulder: Secondary | ICD-10-CM | POA: Insufficient documentation

## 2016-07-03 DIAGNOSIS — M25611 Stiffness of right shoulder, not elsewhere classified: Secondary | ICD-10-CM | POA: Diagnosis not present

## 2016-07-03 NOTE — Therapy (Signed)
[facility] [place], [place], [address] Phone: [phone]   Fax:  [phone]  Physical Therapy Treatment  Patient Details  Name: Amber Stephenson MRN: [id number] Date of Birth: 10/25/1945 Referring Provider: Netta Cedars MD.  Encounter Date: 07/03/2016      PT End of Session - 07/03/16 1515    Visit Number 6   Number of Visits 12   Date for PT Re-Evaluation 08/11/16   PT Start Time [id number]  2 units secondary to moist heat only per patient request   PT Stop Time 1553   PT Time Calculation (min) 38 min   Activity Tolerance Patient tolerated treatment well   Behavior During Therapy [facility] for tasks assessed/performed      Past Medical History:  Diagnosis Date  . Atrial fibrillation ([place])   . Bipolar affective ([place])   . CHF (congestive heart failure) ([place])   . Chronic back pain   . Diabetes mellitus without complication ([place])   . Hyperlipidemia   . Hypertension   . Pain management   . Renal insufficiency     Past Surgical History:  Procedure Laterality Date  . BREAST REDUCTION SURGERY    . SHOULDER SURGERY Right   . THIGH SURGERY      There were no vitals filed for this visit.      Subjective Assessment - 07/03/16 1515    Subjective Reports shoulder feeling good with no pain.   Patient Stated Goals Regain use of my right shoulder.   Currently in Pain? No/denies            [facility] PT Assessment - 07/03/16 0001      Assessment   Medical Diagnosis S/p right shoulder arthoscopy.   Onset Date/Surgical Date 06/09/16   Next MD Visit 07/16/2016     Restrictions   Weight Bearing Restrictions No                     [facility] Adult PT Treatment/Exercise - 07/03/16 0001      Shoulder Exercises: Supine   Protraction AAROM;Both;20 reps   External Rotation AAROM;Right;20 reps   Flexion AAROM;Both;20 reps     Modalities   Modalities Moist Heat     Moist Heat Therapy   Number Minutes Moist Heat 10  Minutes   Moist Heat Location Shoulder     Manual Therapy   Manual Therapy Passive ROM   Passive ROM P/AROM of R shoulder into flex/ER with gentle holds at end range                  PT Short Term Goals - 06/12/16 1417      PT SHORT TERM GOAL #1   Title STG's=LTG's.           PT Long Term Goals - 06/23/16 1342      PT LONG TERM GOAL #1   Title Independent with an HEP.   Time 8   Period Weeks   Status On-going     PT LONG TERM GOAL #2   Title Active right shoulder flexion to 150 degrees so the patient can easily reach overhead.   Time 8   Period Weeks   Status On-going     PT LONG TERM GOAL #3   Title Active ER to 70 degrees+ to allow for easily donning/doffing of apparel   Time 8   Period Weeks   Status On-going     PT LONG TERM GOAL #4   Title  Increase right shoulder strength to a solid 4+/5 to increase stability for performance of functional activities   Time 8   Period Weeks   Status On-going     PT LONG TERM GOAL #5   Title Perform ADL's with pain not > 3/10.   Time 8   Period Weeks   Status On-going               Plan - 07/03/16 1544    Clinical Impression Statement Patient tolerated today's treatment well as she arrived with no pain. Patient still experiencing tenderness around R shoulder surgical site as incision was palpated and patient reported tenderness. Good smooth arc of motion and range with P/AROM of R shoulder into flexion and ER although ER limited. Patient was introduced to [facility] exercises upon approval from MPT as patient did not have a rotator cuff repair during surgery. Patient experienced discomfort more with [facility] ER per patient report. Patient requested moist heat only as she still has tenderness.   Rehab Potential Good   Clinical Impairments Affecting Rehab Potential surgery 06/09/16 current 2 weeks 06/23/16   PT Frequency 3x / week   PT Duration 4 weeks   PT Treatment/Interventions ADLs/Self Care Home  Management;Electrical Stimulation;Moist Heat;Ultrasound;Patient/family education;Neuromuscular re-education;Therapeutic exercise;Therapeutic activities;Manual techniques;Passive range of motion;Vasopneumatic Device   PT Next Visit Plan Passive-assisitve right shoulder ROM; start pulley's per MPT per patient tolerance,vasopneumatic on low. (MD. Veverly Fells 07/20/16)   Consulted and Agree with Plan of Care Patient      Patient will benefit from skilled therapeutic intervention in order to improve the following deficits and impairments:  Pain, Decreased activity tolerance, Decreased range of motion  Visit Diagnosis: Acute pain of right shoulder  Stiffness of right shoulder, not elsewhere classified     Problem List Patient Active Problem List   Diagnosis Date Noted  . Healthcare maintenance 02/12/2016  . Migraine headache with aura 02/12/2016  . Diabetic neuropathy ([place]) 02/06/2016  . Depression   . Encounter for preadmission testing   . Herpes genitalis in women 07/16/2015  . Genital lesion, female 07/12/2015  . Chronic anticoagulation 05/30/2015  . Family history of coronary artery disease in sister 05/30/2015  . Acute on chronic diastolic CHF (congestive heart failure), NYHA class 1 ([place]) 05/30/2015  . Chest pain with moderate risk of acute coronary syndrome 05/29/2015  . Pain in the chest   . Dyspnea   . Essential hypertension   . RLS (restless legs syndrome) 04/27/2015  . Fall 02/26/2015  . Back pain 02/26/2015  . Lipoma 02/08/2015  . Bipolar disorder ([place]) 01/23/2015  . Insomnia 01/23/2015  . CKD stage 3 due to type 2 diabetes mellitus ([place]) 01/05/2015  . Chronic back pain 01/04/2015  . Permanent atrial fibrillation ([place]) 01/04/2015  . Pruritus 01/04/2015  . Diabetes mellitus with stage 2 chronic kidney disease ([place])   . Hyperlipidemia     Wynelle Fanny, PTA 07/03/2016, 4:02 PM  [facility] [address] [place], [place],  [address] Phone: [phone]   Fax:  [phone]  Name: Amber Stephenson MRN: [id number] Date of Birth: 1946-06-01

## 2016-07-09 ENCOUNTER — Ambulatory Visit: Payer: Medicare Other | Admitting: Physical Therapy

## 2016-07-09 ENCOUNTER — Encounter: Payer: Self-pay | Admitting: Physical Therapy

## 2016-07-09 DIAGNOSIS — M25611 Stiffness of right shoulder, not elsewhere classified: Secondary | ICD-10-CM

## 2016-07-09 DIAGNOSIS — M25511 Pain in right shoulder: Secondary | ICD-10-CM

## 2016-07-09 NOTE — Therapy (Signed)
[facility] [place], [place], [address] Phone: [phone]   Fax:  [phone]  Physical Therapy Treatment  Patient Details  Name: Amber Stephenson MRN: [id number] Date of Birth: 12/25/1945 Referring Provider: Netta Cedars MD.  Encounter Date: 07/09/2016      PT End of Session - 07/09/16 1305    Visit Number 7   Number of Visits 12   Date for PT Re-Evaluation 08/11/16   PT Start Time 1301   PT Stop Time 1407  2 units secondary to moist heat use only   PT Time Calculation (min) 66 min   Activity Tolerance Patient tolerated treatment well   Behavior During Therapy [facility] for tasks assessed/performed      Past Medical History:  Diagnosis Date  . Atrial fibrillation ([place])   . Bipolar affective ([place])   . CHF (congestive heart failure) ([place])   . Chronic back pain   . Diabetes mellitus without complication ([place])   . Hyperlipidemia   . Hypertension   . Pain management   . Renal insufficiency     Past Surgical History:  Procedure Laterality Date  . BREAST REDUCTION SURGERY    . SHOULDER SURGERY Right   . THIGH SURGERY      There were no vitals filed for this visit.      Subjective Assessment - 07/09/16 1303    Subjective Reports that she has had a time with pain and discomfort in shoulder with getting her nails done as well as her neck. States that she forgot to take her pain pill this morning and states that she had difficulty moving R arm this morning.   Patient Stated Goals Regain use of my right shoulder.   Currently in Pain? Yes   Pain Score 7    Pain Location Shoulder   Pain Orientation Right   Pain Descriptors / Indicators Aching;Tightness;Other (Comment)  stiffness   Pain Type Surgical pain   Pain Onset 1 to 4 weeks ago   Pain Frequency Constant            [facility] PT Assessment - 07/09/16 0001      Assessment   Medical Diagnosis S/p right shoulder arthoscopy.   Onset Date/Surgical Date 06/09/16    Next MD Visit 07/16/2016     Restrictions   Weight Bearing Restrictions No                     [facility] Adult PT Treatment/Exercise - 07/09/16 0001      Shoulder Exercises: Supine   Protraction AAROM;Both;20 reps     Shoulder Exercises: Pulleys   Flexion Other (comment)  x5 min   Other Pulley Exercises Seated UE ranger flex/circles x20 reps     Modalities   Modalities Moist Heat     Moist Heat Therapy   Number Minutes Moist Heat 15 Minutes   Moist Heat Location Shoulder     Manual Therapy   Manual Therapy Passive ROM;Soft tissue mobilization   Soft tissue mobilization STW to R deltoids, posterior shoulder to reduce guarding and pain   Passive ROM P/AROM of R shoulder into flex/ER with gentle holds at end range                  PT Short Term Goals - 06/12/16 1417      PT SHORT TERM GOAL #1   Title STG's=LTG's.           PT Long Term Goals - 06/23/16 1342  PT LONG TERM GOAL #1   Title Independent with an HEP.   Time 8   Period Weeks   Status On-going     PT LONG TERM GOAL #2   Title Active right shoulder flexion to 150 degrees so the patient can easily reach overhead.   Time 8   Period Weeks   Status On-going     PT LONG TERM GOAL #3   Title Active ER to 70 degrees+ to allow for easily donning/doffing of apparel   Time 8   Period Weeks   Status On-going     PT LONG TERM GOAL #4   Title Increase right shoulder strength to a solid 4+/5 to increase stability for performance of functional activities   Time 8   Period Weeks   Status On-going     PT LONG TERM GOAL #5   Title Perform ADL's with pain not > 3/10.   Time 8   Period Weeks   Status On-going               Plan - 07/09/16 1359    Clinical Impression Statement Patient arrived to treatment limited secondary to increased R shoulder pain. Patient experienced pain throughout AAROM exercises today and experienced catching with AAROM protraction in supine. Patient  experienced pain throughout PROM of R shoulder today as well especially into flexion with frequent oscillations and VCs for relaxation. Patient demonstrated increased R shoulder musculature guarding especially in R Deltoids and Latissimus Dorsi and Teres region. Patient experienced tenderness and soreness intermittantly but frequently throughout manual therapy. Patient encouraged to continue HEP to prevent frozen shoulder and educated that she could use heating pad at home for 15-20 minutes at a time to assist in reducing tightness and muscle guarding. Patient denied electrical stimulation as incision region is still very sore and tender, requesting only moist heat.   Rehab Potential Good   Clinical Impairments Affecting Rehab Potential surgery 06/09/16 current 2 weeks 06/23/16   PT Frequency 3x / week   PT Duration 4 weeks   PT Treatment/Interventions ADLs/Self Care Home Management;Electrical Stimulation;Moist Heat;Ultrasound;Patient/family education;Neuromuscular re-education;Therapeutic exercise;Therapeutic activities;Manual techniques;Passive range of motion;Vasopneumatic Device   PT Next Visit Plan Passive-assisitve right shoulder ROM; start pulley's per MPT per patient tolerance,vasopneumatic on low. (MD. Veverly Fells 07/20/16)   Consulted and Agree with Plan of Care Patient      Patient will benefit from skilled therapeutic intervention in order to improve the following deficits and impairments:  Pain, Decreased activity tolerance, Decreased range of motion  Visit Diagnosis: Acute pain of right shoulder  Stiffness of right shoulder, not elsewhere classified     Problem List Patient Active Problem List   Diagnosis Date Noted  . Healthcare maintenance 02/12/2016  . Migraine headache with aura 02/12/2016  . Diabetic neuropathy ([place]) 02/06/2016  . Depression   . Encounter for preadmission testing   . Herpes genitalis in women 07/16/2015  . Genital lesion, female 07/12/2015  . Chronic  anticoagulation 05/30/2015  . Family history of coronary artery disease in sister 05/30/2015  . Acute on chronic diastolic CHF (congestive heart failure), NYHA class 1 ([place]) 05/30/2015  . Chest pain with moderate risk of acute coronary syndrome 05/29/2015  . Pain in the chest   . Dyspnea   . Essential hypertension   . RLS (restless legs syndrome) 04/27/2015  . Fall 02/26/2015  . Back pain 02/26/2015  . Lipoma 02/08/2015  . Bipolar disorder ([place]) 01/23/2015  . Insomnia 01/23/2015  . CKD stage 3 due  to type 2 diabetes mellitus ([place]) 01/05/2015  . Chronic back pain 01/04/2015  . Permanent atrial fibrillation ([place]) 01/04/2015  . Pruritus 01/04/2015  . Diabetes mellitus with stage 2 chronic kidney disease ([place])   . Hyperlipidemia     Wynelle Fanny, PTA 07/09/2016, 2:14 PM  [facility] [address] [place], [place], [address] Phone: [phone]   Fax:  [phone]  Name: Sheza Westrich MRN: [id number] Date of Birth: 15-Feb-1946

## 2016-07-10 ENCOUNTER — Telehealth: Payer: Self-pay | Admitting: Family Medicine

## 2016-07-10 ENCOUNTER — Other Ambulatory Visit (HOSPITAL_COMMUNITY): Payer: Self-pay

## 2016-07-10 MED ORDER — ARIPIPRAZOLE 10 MG PO TABS: ORAL_TABLET | ORAL | 2 refills | Status: DC

## 2016-07-10 NOTE — Telephone Encounter (Signed)
Pt has rash on neck New problem appt scheduled Pt notified

## 2016-07-15 ENCOUNTER — Ambulatory Visit: Payer: Medicare Other | Admitting: *Deleted

## 2016-07-15 ENCOUNTER — Ambulatory Visit (INDEPENDENT_AMBULATORY_CARE_PROVIDER_SITE_OTHER): Payer: Medicare Other | Admitting: Family Medicine

## 2016-07-15 ENCOUNTER — Encounter: Payer: Self-pay | Admitting: Family Medicine

## 2016-07-15 VITALS — BP 134/84 | HR 70 | Temp 97.2°F | Ht 62.0 in | Wt 193.6 lb

## 2016-07-15 DIAGNOSIS — M7741 Metatarsalgia, right foot: Secondary | ICD-10-CM | POA: Diagnosis not present

## 2016-07-15 DIAGNOSIS — R21 Rash and other nonspecific skin eruption: Secondary | ICD-10-CM | POA: Diagnosis not present

## 2016-07-15 DIAGNOSIS — M79671 Pain in right foot: Secondary | ICD-10-CM | POA: Diagnosis not present

## 2016-07-15 DIAGNOSIS — M25511 Pain in right shoulder: Secondary | ICD-10-CM | POA: Diagnosis not present

## 2016-07-15 DIAGNOSIS — R2242 Localized swelling, mass and lump, left lower limb: Secondary | ICD-10-CM | POA: Diagnosis not present

## 2016-07-15 DIAGNOSIS — M25611 Stiffness of right shoulder, not elsewhere classified: Secondary | ICD-10-CM | POA: Diagnosis not present

## 2016-07-15 MED ORDER — TRIAMCINOLONE ACETONIDE 0.5 % EX OINT: 1.0000 "application " | TOPICAL_OINTMENT | Freq: Two times a day (BID) | CUTANEOUS | 0 refills | Status: DC

## 2016-07-15 NOTE — Therapy (Addendum)
[facility] [place], [place], [address] Phone: [phone]   Fax:  [phone]  Physical Therapy Treatment  Patient Details  Name: Amber Stephenson MRN: [id number] Date of Birth: Aug 21, 1945 Referring Provider: Netta Cedars MD.  Encounter Date: 07/15/2016      PT End of Session - 07/15/16 1520    Visit Number 8   Number of Visits 12   Date for PT Re-Evaluation 08/11/16   PT Start Time [id number]   PT Stop Time [id number]   PT Time Calculation (min) 58 min      Past Medical History:  Diagnosis Date  . Atrial fibrillation ([place])   . Bipolar affective ([place])   . CHF (congestive heart failure) ([place])   . Chronic back pain   . Diabetes mellitus without complication ([place])   . Hyperlipidemia   . Hypertension   . Pain management   . Renal insufficiency     Past Surgical History:  Procedure Laterality Date  . BREAST REDUCTION SURGERY    . SHOULDER SURGERY Right   . THIGH SURGERY      There were no vitals filed for this visit.      Subjective Assessment - 07/15/16 1516    Subjective  Did great after last Rx pain is 4/10   Patient Stated Goals Regain use of my right shoulder.   Currently in Pain? Yes   Pain Score 4    Pain Location Shoulder   Pain Orientation Right   Pain Descriptors / Indicators Aching;Tightness   Pain Type Surgical pain   Pain Onset 1 to 4 weeks ago   Pain Frequency Constant                         [facility] Adult PT Treatment/Exercise - 07/15/16 0001      Shoulder Exercises: Supine   Protraction AAROM;Both;20 reps     Shoulder Exercises: Pulleys   Flexion Other (comment)  x5 min   Other Pulley Exercises Seated UE ranger flexEXt   and circles each way x 5 mins     Modalities   Modalities Moist Heat     Moist Heat Therapy   Number Minutes Moist Heat 15 Minutes   Moist Heat Location Shoulder     Manual Therapy   Manual Therapy Passive ROM;Soft tissue mobilization   Passive ROM  P/AROM of R shoulder into flex/ER with gentle holds at end range                  PT Short Term Goals - 06/12/16 1417      PT SHORT TERM GOAL #1   Title STG's=LTG's.           PT Long Term Goals - 06/23/16 1342      PT LONG TERM GOAL #1   Title Independent with an HEP.   Time 8   Period Weeks   Status On-going     PT LONG TERM GOAL #2   Title Active right shoulder flexion to 150 degrees so the patient can easily reach overhead.   Time 8   Period Weeks   Status On-going     PT LONG TERM GOAL #3   Title Active ER to 70 degrees+ to allow for easily donning/doffing of apparel   Time 8   Period Weeks   Status On-going     PT LONG TERM GOAL #4   Title Increase right shoulder strength to a solid 4+/5 to  increase stability for performance of functional activities   Time 8   Period Weeks   Status On-going     PT LONG TERM GOAL #5   Title Perform ADL's with pain not > 3/10.   Time 8   Period Weeks   Status On-going               Plan - 07/15/16 1525    Clinical Impression Statement Pt arrived to clinic today feeling fairly good with RT shldr pain rated 4/10. She still reports shldr soreness when palpated, but doing better with therex and PROM. She was able to reach PROM for flexion  150 degrees and 60 degrees for ER by end of RX.  No Estim used again as per Pt due to tenderness. Did well with HMP   Clinical Impairments Affecting Rehab Potential surgery 06/09/16 current 2 weeks 06/23/16   PT Frequency 3x / week   PT Duration 4 weeks   PT Treatment/Interventions ADLs/Self Care Home Management;Electrical Stimulation;Moist Heat;Ultrasound;Patient/family education;Neuromuscular re-education;Therapeutic exercise;Therapeutic activities;Manual techniques;Passive range of motion;Vasopneumatic Device   PT Next Visit Plan Passive-assisitve right shoulder ROM; start pulley's per MPT per patient tolerance,vasopneumatic on low. (MD. Veverly Fells 07/15/16)    Send MD Note    Consulted and Agree with Plan of Care Patient      Patient will benefit from skilled therapeutic intervention in order to improve the following deficits and impairments:  Pain, Decreased activity tolerance, Decreased range of motion  Visit Diagnosis: Acute pain of right shoulder  Stiffness of right shoulder, not elsewhere classified     Problem List Patient Active Problem List   Diagnosis Date Noted  . Healthcare maintenance 02/12/2016  . Migraine headache with aura 02/12/2016  . Diabetic neuropathy ([place]) 02/06/2016  . Depression   . Encounter for preadmission testing   . Herpes genitalis in women 07/16/2015  . Genital lesion, female 07/12/2015  . Chronic anticoagulation 05/30/2015  . Family history of coronary artery disease in sister 05/30/2015  . Acute on chronic diastolic CHF (congestive heart failure), NYHA class 1 ([place]) 05/30/2015  . Chest pain with moderate risk of acute coronary syndrome 05/29/2015  . Pain in the chest   . Dyspnea   . Essential hypertension   . RLS (restless legs syndrome) 04/27/2015  . Fall 02/26/2015  . Back pain 02/26/2015  . Lipoma 02/08/2015  . Bipolar disorder ([place]) 01/23/2015  . Insomnia 01/23/2015  . CKD stage 3 due to type 2 diabetes mellitus ([place]) 01/05/2015  . Chronic back pain 01/04/2015  . Permanent atrial fibrillation ([place]) 01/04/2015  . Pruritus 01/04/2015  . Diabetes mellitus with stage 2 chronic kidney disease ([place])   . Hyperlipidemia     [place], [place], PTA 07/15/2016, 8:53 PM Mali Applegate MPT [facility] [place], [place], [address] Phone: [phone]   Fax:  [phone]  Name: Avian Ohmer MRN: [id number] Date of Birth: Feb 03, 1946

## 2016-07-15 NOTE — Patient Instructions (Signed)
Great to see you!  You will receive a call for general surgery.   Try the ointment for the rash, it should work well.

## 2016-07-15 NOTE — Progress Notes (Signed)
   HPI  Patient presents today here with a rash and left thigh mass.  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 She states over the last 4 months she's had persistently worsening thickening and enlarging mass of that same area. She has mild to moderate pain because of it.  Patient also complains of itchy rash around her neck for the last 3-4 days, it already appears to be getting better, however she would like some steroid cream to help it resolve quicker. She denies any concerns for infection or current illness. She's breathing normally.  Her mood is doing well.    PMH: Smoking status noted ROS: Per HPI  Objective: BP 134/84   Pulse 70   Temp 97.2 F (36.2 C) (Oral)   Ht 5\' 2"  (1.575 m)   Wt 193 lb 9.6 oz (87.8 kg)   BMI 35.41 kg/m  Gen: NAD, alert, cooperative with exam HEENT: NCAT CV: RRR, good S1/S2, no murmur Resp: CTABL, no wheezes, non-labored Ext: No edema, warm Neuro: Alert and oriented, No gross deficits  Skin:  Left thigh with well-healed scar, 6 cm x 6 cm area of fullness on the left upper thigh, mild tenderness to palpation Area is mobile and does not feel attached to the underlying muscle  Patchy very light erythema surrounding the neck, no scale or papules.  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  # Rash Unclear etiology, however improving Considering that it is itchy, and will likely respond well to Kenalog ointment    Orders Placed This Encounter  Procedures  . Ambulatory referral to General Surgery    Referral Priority:   Routine    Referral Type:   Surgical    Referral Reason:   Specialty Services Required    Requested Specialty:    General Surgery    Number of Visits Requested:   1    Meds ordered this encounter  Medications  . triamcinolone ointment (KENALOG) 0.5 %    Sig: Apply 1 application topically 2 (two) times daily.    Dispense:  30 g    Refill:  [place], MD [facility] 07/15/2016, 1:15 PM

## 2016-07-16 DIAGNOSIS — Z4789 Encounter for other orthopedic aftercare: Secondary | ICD-10-CM | POA: Diagnosis not present

## 2016-07-16 DIAGNOSIS — T8484XA Pain due to internal orthopedic prosthetic devices, implants and grafts, initial encounter: Secondary | ICD-10-CM | POA: Diagnosis not present

## 2016-07-18 ENCOUNTER — Ambulatory Visit (INDEPENDENT_AMBULATORY_CARE_PROVIDER_SITE_OTHER): Payer: Medicare Other | Admitting: Family Medicine

## 2016-07-18 ENCOUNTER — Encounter: Payer: Self-pay | Admitting: Family Medicine

## 2016-07-18 VITALS — BP 133/74 | HR 78 | Temp 97.0°F | Ht 62.0 in | Wt 196.4 lb

## 2016-07-18 DIAGNOSIS — R05 Cough: Secondary | ICD-10-CM | POA: Diagnosis not present

## 2016-07-18 DIAGNOSIS — H66001 Acute suppurative otitis media without spontaneous rupture of ear drum, right ear: Secondary | ICD-10-CM | POA: Diagnosis not present

## 2016-07-18 DIAGNOSIS — J0101 Acute recurrent maxillary sinusitis: Secondary | ICD-10-CM | POA: Diagnosis not present

## 2016-07-18 DIAGNOSIS — J101 Influenza due to other identified influenza virus with other respiratory manifestations: Secondary | ICD-10-CM | POA: Diagnosis not present

## 2016-07-18 DIAGNOSIS — R52 Pain, unspecified: Secondary | ICD-10-CM | POA: Diagnosis not present

## 2016-07-18 DIAGNOSIS — R6883 Chills (without fever): Secondary | ICD-10-CM | POA: Diagnosis not present

## 2016-07-18 LAB — VERITOR FLU A/B WAIVED
Influenza A: NEGATIVE
Influenza B: POSITIVE — AB

## 2016-07-18 MED ORDER — AMOXICILLIN-POT CLAVULANATE 875-125 MG PO TABS: 1.0000 | ORAL_TABLET | Freq: Two times a day (BID) | ORAL | 0 refills | Status: DC

## 2016-07-18 MED ORDER — OSELTAMIVIR PHOSPHATE 75 MG PO CAPS: 75.0000 mg | ORAL_CAPSULE | Freq: Two times a day (BID) | ORAL | 0 refills | Status: DC

## 2016-07-18 MED ORDER — LEVALBUTEROL HCL 1.25 MG/3ML IN NEBU
1.2500 mg | INHALATION_SOLUTION | RESPIRATORY_TRACT | 0 refills | Status: DC | PRN
Start: 2016-07-18 — End: 2017-10-20

## 2016-07-18 NOTE — Patient Instructions (Signed)
Great to see you!   Influenza, Adult Influenza, more commonly known as "the flu," is a viral infection that primarily affects the respiratory tract. The respiratory tract includes organs that help you breathe, such as the lungs, nose, and throat. The flu causes many common cold symptoms, as well as a high fever and body aches. The flu spreads easily from person to person (is contagious). Getting a flu shot (influenza vaccination) every year is the best way to prevent influenza. What are the causes? Influenza is caused by a virus. You can catch the virus by:  Breathing in droplets from an infected person's cough or sneeze.  Touching something that was recently contaminated with the virus and then touching your mouth, nose, or eyes. What increases the risk? The following factors may make you more likely to get the flu:  Not cleaning your hands frequently with soap and water or alcohol-based hand sanitizer.  Having close contact with many people during cold and flu season.  Touching your mouth, eyes, or nose without washing or sanitizing your hands first.  Not drinking enough fluids or not eating a healthy diet.  Not getting enough sleep or exercise.  Being under a high amount of stress.  Not getting a yearly (annual) flu shot. You may be at a higher risk of complications from the flu, such as a severe lung infection (pneumonia), if you:  Are over the age of 65.  Are pregnant.  Have a weakened disease-fighting system (immune system). You may have a weakened immune system if you:  Have HIV or AIDS.  Are undergoing chemotherapy.  Aretaking medicines that reduce the activity of (suppress) the immune system.  Have a long-term (chronic) illness, such as heart disease, kidney disease, diabetes, or lung disease.  Have a liver disorder.  Are obese.  Have anemia. What are the signs or symptoms? Symptoms of this condition typically last 4-10 days and may  include:  Fever.  Chills.  Headache, body aches, or muscle aches.  Sore throat.  Cough.  Runny or congested nose.  Chest discomfort and cough.  Poor appetite.  Weakness or tiredness (fatigue).  Dizziness.  Nausea or vomiting. How is this diagnosed? This condition may be diagnosed based on your medical history and a physical exam. Your health care provider may do a nose or throat swab test to confirm the diagnosis. How is this treated? If influenza is detected early, you can be treated with antiviral medicine that can reduce the length of your illness and the severity of your symptoms. This medicine may be given by mouth (orally) or through an IV tube that is inserted in one of your veins. The goal of treatment is to relieve symptoms by taking care of yourself at home. This may include taking over-the-counter medicines, drinking plenty of fluids, and adding humidity to the air in your home. In some cases, influenza goes away on its own. Severe influenza or complications from influenza may be treated in a hospital. Follow these instructions at home:  Take over-the-counter and prescription medicines only as told by your health care provider.  Use a cool mist humidifier to add humidity to the air in your home. This can make breathing easier.  Rest as needed.  Drink enough fluid to keep your urine clear or pale yellow.  Cover your mouth and nose when you cough or sneeze.  Wash your hands with soap and water often, especially after you cough or sneeze. If soap and water are not available, use   hand sanitizer.  Stay home from work or school as told by your health care provider. Unless you are visiting your health care provider, try to avoid leaving home until your fever has been gone for 24 hours without the use of medicine.  Keep all follow-up visits as told by your health care provider. This is important. How is this prevented?  Getting an annual flu shot is the best way to  avoid getting the flu. You may get the flu shot in late summer, fall, or winter. Ask your health care provider when you should get your flu shot.  Wash your hands often or use hand sanitizer often.  Avoid contact with people who are sick during cold and flu season.  Eat a healthy diet, drink plenty of fluids, get enough sleep, and exercise regularly. Contact a health care provider if:  You develop new symptoms.  You have:  Chest pain.  Diarrhea.  A fever.  Your cough gets worse.  You produce more mucus.  You feel nauseous or you vomit. Get help right away if:  You develop shortness of breath or difficulty breathing.  Your skin or nails turn a bluish color.  You have severe pain or stiffness in your neck.  You develop a sudden headache or sudden pain in your face or ear.  You cannot stop vomiting. This information is not intended to replace advice given to you by your health care provider. Make sure you discuss any questions you have with your health care provider. Document Released: 05/16/2000 Document Revised: 10/25/2015 Document Reviewed: 03/13/2015 Elsevier Interactive Patient Education  2017 Elsevier Inc.  

## 2016-07-18 NOTE — Progress Notes (Signed)
   HPI  Patient presents today with concern for influenza.  Patient explains that she's had about 36 hours of cough, headache, bilateral ear pain, chills, body aches, and facial pain.  She is having mild dyspnea, she has a nebulizer at home which she uses occasionally, she has not used it this occasion.  She is tolerating food and fluids normally  She was seen in the clinic on Tuesday, she developed symptoms Wednesday night  PMH: Smoking status noted ROS: Per HPI  Objective: BP 133/74   Pulse 78   Temp 97 F (36.1 C) (Oral)   Ht 5\' 2"  (1.575 m)   Wt 196 lb 6.4 oz (89.1 kg)   BMI 35.92 kg/m  Gen: NAD, alert, cooperative with exam HEENT: NCAT, list oral mucosa, right TM erythematous with loss of landmarks, left TM with barely appreciable landmarks, tenderness to palpation of bilateral maxillary sinuses CV: RRR, good S1/S2, no murmur Resp: CTABL, no wheezes, non-labored Ext: No edema, warm Neuro: Alert and oriented, No gross deficits  Assessment and plan:  # Influenza B Tamiflu, she's within the 48-hour window, she also has several comorbidities which cause indication for use even outside of the window Discussed very low threshold for evaluation if she has any worsening respiratory issues.  # Acute suppurative otitis media Treat with Augmentin  # Acute maxillary sinusitis Treat with Augmentin  Patient has reasonably well appearing considering her acute illness. Considering several comorbidities I recommended a very low threshold for reevaluation, especially if she develops any respiratory distress or worsening respiratory issues.  I have sent Xopenex nebulizer, also given her a few samples that were in the clinic.     Orders Placed This Encounter  Procedures  . Veritor Flu A/B Waived    Order Specific Question:   Source    Answer:   nasal    Meds ordered this encounter  Medications  . oseltamivir (TAMIFLU) 75 MG capsule    Sig: Take 1 capsule (75 mg total)  by mouth 2 (two) times daily.    Dispense:  10 capsule    Refill:  0  . amoxicillin-clavulanate (AUGMENTIN) 875-125 MG tablet    Sig: Take 1 tablet by mouth 2 (two) times daily.    Dispense:  20 tablet    Refill:  0  . levalbuterol (XOPENEX) 1.25 MG/3ML nebulizer solution    Sig: Take 1.25 mg by nebulization every 4 (four) hours as needed for wheezing.    Dispense:  72 mL    Refill:  0    Laroy Apple, MD [facility] 07/18/2016, 2:20 PM

## 2016-07-21 ENCOUNTER — Encounter: Payer: Self-pay | Admitting: Physical Therapy

## 2016-07-24 ENCOUNTER — Other Ambulatory Visit: Payer: Self-pay | Admitting: Family Medicine

## 2016-07-24 ENCOUNTER — Encounter: Payer: Self-pay | Admitting: *Deleted

## 2016-07-25 ENCOUNTER — Telehealth: Payer: Self-pay | Admitting: Family Medicine

## 2016-07-25 MED ORDER — FLUCONAZOLE 150 MG PO TABS: 150.0000 mg | ORAL_TABLET | Freq: Once | ORAL | 0 refills | Status: AC

## 2016-07-25 NOTE — Telephone Encounter (Signed)
What is the name of the medication? Diflucan, has yeast from antibiotic   Have you contacted your pharmacy to request a refill? no  Which pharmacy would you like this sent to? [facility] in [place].   Patient notified that their request is being sent to the clinical staff for review and that they should receive a call once it is complete. If they do not receive a call within 24 hours they can check with their pharmacy or our office.

## 2016-07-25 NOTE — Telephone Encounter (Signed)
Pt notified RX for Diflucan was sent into [facility] per pt request Okayed per Dr Wendi Snipes

## 2016-07-26 ENCOUNTER — Other Ambulatory Visit: Payer: Self-pay | Admitting: Family Medicine

## 2016-07-28 ENCOUNTER — Other Ambulatory Visit: Payer: Self-pay | Admitting: *Deleted

## 2016-07-28 MED ORDER — PREGABALIN 100 MG PO CAPS: 100.0000 mg | ORAL_CAPSULE | Freq: Two times a day (BID) | ORAL | 1 refills | Status: DC

## 2016-07-28 NOTE — Telephone Encounter (Signed)
Amber Stephenson - seen 07/2016 If approved, will print - have nurse call pt to come pick up

## 2016-07-29 ENCOUNTER — Encounter: Payer: Self-pay | Admitting: Family

## 2016-07-29 ENCOUNTER — Other Ambulatory Visit: Payer: Self-pay

## 2016-07-29 ENCOUNTER — Ambulatory Visit: Payer: Medicare Other | Admitting: *Deleted

## 2016-07-29 ENCOUNTER — Ambulatory Visit (INDEPENDENT_AMBULATORY_CARE_PROVIDER_SITE_OTHER): Payer: Medicare Other | Admitting: Family

## 2016-07-29 VITALS — BP 104/75 | HR 49 | Temp 97.1°F | Ht 62.0 in | Wt 190.2 lb

## 2016-07-29 DIAGNOSIS — N3001 Acute cystitis with hematuria: Secondary | ICD-10-CM | POA: Diagnosis not present

## 2016-07-29 DIAGNOSIS — B373 Candidiasis of vulva and vagina: Secondary | ICD-10-CM | POA: Diagnosis not present

## 2016-07-29 DIAGNOSIS — M25611 Stiffness of right shoulder, not elsewhere classified: Secondary | ICD-10-CM

## 2016-07-29 DIAGNOSIS — B3731 Acute candidiasis of vulva and vagina: Secondary | ICD-10-CM

## 2016-07-29 DIAGNOSIS — M25511 Pain in right shoulder: Secondary | ICD-10-CM

## 2016-07-29 DIAGNOSIS — R109 Unspecified abdominal pain: Secondary | ICD-10-CM

## 2016-07-29 LAB — URINALYSIS, COMPLETE
Bilirubin, UA: NEGATIVE
Glucose, UA: NEGATIVE
Ketones, UA: NEGATIVE
Nitrite, UA: NEGATIVE
Protein, UA: NEGATIVE
Specific Gravity, UA: 1.01 (ref 1.005–1.030)
Urobilinogen, Ur: 0.2 mg/dL (ref 0.2–1.0)
pH, UA: 5.5 (ref 5.0–7.5)

## 2016-07-29 LAB — MICROSCOPIC EXAMINATION

## 2016-07-29 MED ORDER — PREGABALIN 100 MG PO CAPS: 100.0000 mg | ORAL_CAPSULE | Freq: Two times a day (BID) | ORAL | 1 refills | Status: DC

## 2016-07-29 MED ORDER — EXENATIDE ER 2 MG ~~LOC~~ PEN: PEN_INJECTOR | SUBCUTANEOUS | 1 refills | Status: DC

## 2016-07-29 MED ORDER — FLUCONAZOLE 150 MG PO TABS: 150.0000 mg | ORAL_TABLET | ORAL | 0 refills | Status: DC | PRN

## 2016-07-29 MED ORDER — NITROFURANTOIN MONOHYD MACRO 100 MG PO CAPS: 100.0000 mg | ORAL_CAPSULE | Freq: Two times a day (BID) | ORAL | 0 refills | Status: DC

## 2016-07-29 NOTE — Patient Instructions (Signed)
Asymptomatic Bacteriuria Asymptomatic bacteriuria is the presence of a large number of bacteria in the urine without the usual symptoms of burning or frequent urination. What are the causes? This condition is caused by an increase in bacteria in the urine. This increase can be caused by:  Bacteria entering the urinary tract, such as during sex.  A blockage in the urinary tract, such as from kidney stones or a tumor.  Bladder problems that prevent the bladder from emptying.  What increases the risk? You are more likely to develop this condition if:  You have diabetes mellitus.  You are an elderly adult, especially if you are also in a long-term care facility.  You are pregnant and in the first trimester.  You have kidney stones.  You are female.  You have had a kidney transplant.  You have a leaky kidney tube valve (reflux).  You had a urinary catheter for a long period of time.  What are the signs or symptoms? There are no symptoms of this condition. How is this diagnosed? This condition is diagnosed with a urine test. Because this condition does not cause symptoms, it is usually diagnosed when a urine sample is taken to treat or diagnose another condition, such as pregnancy or kidney problems. Most women who are in their first trimester of pregnancy are screened for asymptomatic bacteriuria. How is this treated? Usually, treatment is not needed for this condition. Treating the condition can lead to other problems, such as a yeast infection or the growth of bacteria that do not respond to treatment (antibiotic-resistant bacteria). Some people, such as pregnant women and people with kidney transplants, do need treatment with antibiotic medicines to prevent kidney infection (pyelonephritis). In pregnant women, kidney infection can lead to premature labor, fetal growth restriction, or newborn death. Follow these instructions at home: Medicines  Take over-the-counter and  prescription medicines only as told by your health care provider.  If you were prescribed an antibiotic medicine, take it as told by your health care provider. Do not stop taking the antibiotic even if you start to feel better. General instructions  Monitor your condition for any changes.  Drink enough fluid to keep your urine clear or pale yellow.  Go to the bathroom more often to keep your bladder empty.  If you are female, keep the area around your vagina and rectum clean. Wipe yourself from front to back after urinating.  Keep all follow-up visits as told by your health care provider. This is important. Contact a health care provider if:  You notice any new symptoms, such as back pain or burning while urinating. Get help right away if:  You develop signs of an infection such as: ? A burning sensation when you urinate. ? Have pain when you urinate. ? Develop an intense need to urinate. ? Urinating more frequently. ? Back pain or pelvic pain. ? Fever or chills.  You have blood in your urine.  Your urine becomes discolored or cloudy.  Your urine smells bad.  You have severe pain that cannot be controlled with medicine. Summary  Asymptomatic bacteriuria is the presence of a large number of bacteria in the urine without the usual symptoms of burning or frequent urination.  Usually, treatment is not needed for this condition. Treating the condition can lead to other problems, such as too much yeast and the growth of antibiotic-resistant bacteria.  Some people, such as pregnant women and people with kidney transplants, do need treatment with antibiotic medicines to prevent   kidney infection (pyelonephritis).  If you were prescribed an antibiotic medicine, take it as told by your health care provider. Do not stop taking the antibiotic even if you start to feel better. This information is not intended to replace advice given to you by your health care provider. Make sure you  discuss any questions you have with your health care provider. Document Released: 05/19/2005 Document Revised: 05/13/2016 Document Reviewed: 05/13/2016 Elsevier Interactive Patient Education  2017 [employer]vier [employer].  

## 2016-07-29 NOTE — Addendum Note (Signed)
Addended by: Nigel Berthold C on: 07/29/2016 10:35 AM   Modules accepted: Orders

## 2016-07-29 NOTE — Progress Notes (Signed)
   Subjective:    Patient ID: Amber Stephenson, female    DOB: [birth year], 71 y.o.   MRN: [id number]  Dysuria   This is a recurrent problem. The current episode started 1 to 4 weeks ago. The problem occurs every urination. The problem has been waxing and waning. The quality of the pain is described as burning. The pain is at a severity of 7/10. Associated symptoms include a discharge, flank pain, frequency and nausea. Pertinent negatives include no hematuria or vomiting. She has tried increased fluids (augmentin) for the symptoms. The treatment provided mild relief.      Review of Systems  Gastrointestinal: Positive for nausea. Negative for vomiting.  Genitourinary: Positive for dysuria, flank pain and frequency. Negative for hematuria.  All other systems reviewed and are negative.      Objective:   Physical Exam  Constitutional: She is oriented to person, place, and time. She appears well-developed and well-nourished. No distress.  HENT:  Head: Normocephalic and atraumatic.  Right Ear: External ear normal.  Mouth/Throat: Oropharynx is clear and moist.  Cardiovascular: Normal rate, regular rhythm, normal heart sounds and intact distal pulses.   No murmur heard. Pulmonary/Chest: Effort normal and breath sounds normal. No respiratory distress. She has no wheezes.  Abdominal: Soft. Bowel sounds are normal. She exhibits no distension. There is no tenderness.  Musculoskeletal: Normal range of motion. She exhibits no edema or tenderness.  Mild right CVA tenderness   Neurological: She is alert and oriented to person, place, and time.  Skin: Skin is warm and dry.  Psychiatric: She has a normal mood and affect. Her behavior is normal. Judgment and thought content normal.  Vitals reviewed.     BP 104/75   Pulse (!) 49   Temp 97.1 F (36.2 C) (Oral)   Ht 5\' 2"  (1.575 m)   Wt 190 lb 3.2 oz (86.3 kg)   BMI 34.79 kg/m      Assessment & Plan:  1. Right flank pain - Urinalysis,  Complete  2. Acute cystitis with hematuria Force fluids AZO over the counter X2 days RTO prn Culture pending - Urine culture - nitrofurantoin, macrocrystal-monohydrate, (MACROBID) 100 MG capsule; Take 1 capsule (100 mg total) by mouth 2 (two) times daily.  Dispense: 10 capsule; Refill: 0  3. Vagina, candidiasis -Keep clean and dry - fluconazole (DIFLUCAN) 150 MG tablet; Take 1 tablet (150 mg total) by mouth every three (3) days as needed.  Dispense: 3 tablet; Refill: 0  Evelina Dun, FNP

## 2016-07-29 NOTE — Telephone Encounter (Signed)
Patient aware rx ready to be picked up 

## 2016-07-29 NOTE — Therapy (Signed)
[facility] [place], [place], [address] Phone: [phone]   Fax:  [phone]  Physical Therapy Treatment  Patient Details  Name: Amber Stephenson MRN: [id number] Date of Birth: [birth year] Referring Provider: Netta Cedars MD.  Encounter Date: 07/29/2016      PT End of Session - 07/29/16 1438    Visit Number 9   Number of Visits 12   Date for PT Re-Evaluation 08/11/16   PT Start Time [id number]   PT Stop Time 1531   PT Time Calculation (min) 59 min      Past Medical History:  Diagnosis Date  . Atrial fibrillation ([place])   . Bipolar affective ([place])   . CHF (congestive heart failure) ([place])   . Chronic back pain   . Diabetes mellitus without complication ([place])   . Hyperlipidemia   . Hypertension   . Pain management   . Renal insufficiency     Past Surgical History:  Procedure Laterality Date  . BREAST REDUCTION SURGERY    . SHOULDER SURGERY Right   . THIGH SURGERY      There were no vitals filed for this visit.      Subjective Assessment - 07/29/16 1433    Subjective  Did great after last Rx pain is 4/10. Getting over the Flu   Patient Stated Goals Regain use of my right shoulder.   Currently in Pain? Yes   Pain Score 4    Pain Location Shoulder   Pain Orientation Right   Pain Descriptors / Indicators Tightness   Pain Type Surgical pain   Pain Onset 1 to 4 weeks ago   Pain Frequency Constant                         [facility] Adult PT Treatment/Exercise - 07/29/16 0001      Shoulder Exercises: Pulleys   Flexion Other (comment)  x5 min   Other Pulley Exercises Seated UE ranger flexEXt   and circles each way x 5 mins     Modalities   Modalities Moist Heat;Electrical Stimulation     Moist Heat Therapy   Number Minutes Moist Heat 15 Minutes   Moist Heat Location Shoulder     Electrical Stimulation   Electrical Stimulation Location RT shldr premod 80-150hz  x15 mins   Electrical  Stimulation Goals Pain     Manual Therapy   Manual Therapy Passive ROM;Soft tissue mobilization   Passive ROM P/AROM of R shoulder into flex/ER/IR with gentle holds at end range                  PT Short Term Goals - 06/12/16 1417      PT SHORT TERM GOAL #1   Title STG's=LTG's.           PT Long Term Goals - 06/23/16 1342      PT LONG TERM GOAL #1   Title Independent with an HEP.   Time 8   Period Weeks   Status On-going     PT LONG TERM GOAL #2   Title Active right shoulder flexion to 150 degrees so the patient can easily reach overhead.   Time 8   Period Weeks   Status On-going     PT LONG TERM GOAL #3   Title Active ER to 70 degrees+ to allow for easily donning/doffing of apparel   Time 8   Period Weeks   Status On-going     PT  LONG TERM GOAL #4   Title Increase right shoulder strength to a solid 4+/5 to increase stability for performance of functional activities   Time 8   Period Weeks   Status On-going     PT LONG TERM GOAL #5   Title Perform ADL's with pain not > 3/10.   Time 8   Period Weeks   Status On-going               Plan - 07/29/16 1543    Clinical Impression Statement Pt arrived today doing fairly well after  after missing last week due to the flu. She was able to perform AAROM exs with only minimal pain increase. PROM for flexion was 150 degrees, ER to 50 degrees, and IR to 70 degrees. LTGs are ongoing. Pt was able to tolerate Premod to RT shldrr.   Rehab Potential Good   Clinical Impairments Affecting Rehab Potential surgery 06/09/16 current 2 weeks 06/23/16   PT Frequency 3x / week   PT Duration 4 weeks   PT Treatment/Interventions ADLs/Self Care Home Management;Electrical Stimulation;Moist Heat;Ultrasound;Patient/family education;Neuromuscular re-education;Therapeutic exercise;Therapeutic activities;Manual techniques;Passive range of motion;Vasopneumatic Device   PT Next Visit Plan Passive-assisitve right shoulder ROM; start  pulley's per MPT per patient tolerance,vasopneumatic on low. (MD. Veverly Fells 07/15/16)    Send MD Note   Consulted and Agree with Plan of Care Patient      Patient will benefit from skilled therapeutic intervention in order to improve the following deficits and impairments:  Pain, Decreased activity tolerance, Decreased range of motion  Visit Diagnosis: Acute pain of right shoulder  Stiffness of right shoulder, not elsewhere classified     Problem List Patient Active Problem List   Diagnosis Date Noted  . Healthcare maintenance 02/12/2016  . Migraine headache with aura 02/12/2016  . Diabetic neuropathy ([place]) 02/06/2016  . Depression   . Encounter for preadmission testing   . Herpes genitalis in women 07/16/2015  . Genital lesion, female 07/12/2015  . Chronic anticoagulation 05/30/2015  . Family history of coronary artery disease in sister 05/30/2015  . Acute on chronic diastolic CHF (congestive heart failure), NYHA class 1 ([place]) 05/30/2015  . Chest pain with moderate risk of acute coronary syndrome 05/29/2015  . Pain in the chest   . Dyspnea   . Essential hypertension   . RLS (restless legs syndrome) 04/27/2015  . Fall 02/26/2015  . Back pain 02/26/2015  . Lipoma 02/08/2015  . Bipolar disorder ([place]) 01/23/2015  . Insomnia 01/23/2015  . CKD stage 3 due to type 2 diabetes mellitus ([place]) 01/05/2015  . Chronic back pain 01/04/2015  . Permanent atrial fibrillation ([place]) 01/04/2015  . Pruritus 01/04/2015  . Diabetes mellitus with stage 2 chronic kidney disease ([place])   . Hyperlipidemia     RAMSEUR,CHRIS,  PTA 07/29/2016, 3:58 PM  [facility] [address] [place], [place], [address] Phone: [phone]   Fax:  [phone]  Name: Aundrea Fruhling MRN: [id number] Date of Birth: 11-26-1945

## 2016-07-31 ENCOUNTER — Other Ambulatory Visit: Payer: Self-pay | Admitting: Family

## 2016-07-31 ENCOUNTER — Ambulatory Visit: Payer: Medicare Other | Attending: Orthopedic Surgery | Admitting: Physical Therapy

## 2016-07-31 DIAGNOSIS — M25611 Stiffness of right shoulder, not elsewhere classified: Secondary | ICD-10-CM | POA: Diagnosis not present

## 2016-07-31 DIAGNOSIS — M76812 Anterior tibial syndrome, left leg: Secondary | ICD-10-CM | POA: Diagnosis not present

## 2016-07-31 DIAGNOSIS — M25511 Pain in right shoulder: Secondary | ICD-10-CM | POA: Insufficient documentation

## 2016-07-31 DIAGNOSIS — M79672 Pain in left foot: Secondary | ICD-10-CM | POA: Diagnosis not present

## 2016-07-31 LAB — URINE CULTURE

## 2016-07-31 MED ORDER — SULFAMETHOXAZOLE-TRIMETHOPRIM 800-160 MG PO TABS: 1.0000 | ORAL_TABLET | Freq: Two times a day (BID) | ORAL | 0 refills | Status: DC

## 2016-07-31 NOTE — Therapy (Signed)
[facility] [place], [place], [address] Phone: [phone]   Fax:  [phone]  Physical Therapy Treatment  Patient Details  Name: Amber Stephenson MRN: [id number] Date of Birth: [birth year] Referring Provider: Netta Cedars MD.  Encounter Date: 07/31/2016      PT End of Session - 07/31/16 1616    PT Start Time 0230   PT Stop Time 0323   PT Time Calculation (min) 53 min   Activity Tolerance Patient tolerated treatment well   Behavior During Therapy [facility] for tasks assessed/performed      Past Medical History:  Diagnosis Date  . Atrial fibrillation ([place])   . Bipolar affective ([place])   . CHF (congestive heart failure) ([place])   . Chronic back pain   . Diabetes mellitus without complication ([place])   . Hyperlipidemia   . Hypertension   . Pain management   . Renal insufficiency     Past Surgical History:  Procedure Laterality Date  . BREAST REDUCTION SURGERY    . SHOULDER SURGERY Right   . THIGH SURGERY      There were no vitals filed for this visit.      Subjective Assessment - 07/31/16 1607    Subjective Doing better.   Patient Stated Goals Regain use of my right shoulder.   Pain Score 4    Pain Location Shoulder   Pain Orientation Right   Pain Descriptors / Indicators Tightness   Pain Type Surgical pain   Pain Onset 1 to 4 weeks ago                         [facility] Adult PT Treatment/Exercise - 07/31/16 0001      Shoulder Exercises: Supine   Other Supine Exercises Supine cane bench press x 1 minute.     Shoulder Exercises: Pulleys   Flexion Limitations 5 minutes f/b seated UE Ranger x 3 minutes.     Modalities   Modalities Electrical Stimulation;Moist Heat     Moist Heat Therapy   Number Minutes Moist Heat 15 Minutes   Moist Heat Location --  Right shoulder.     Acupuncturist Location --  Right shoulder.   Electrical Stimulation Action Pre-mod.   Electrical Stimulation Parameters 80-150 Hz x 15 minutes.   Electrical Stimulation Goals Pain     Manual Therapy   Manual Therapy Passive ROM   Passive ROM In supine:  PROM to patient's right shoulder x 17 minutes.                  PT Short Term Goals - 06/12/16 1417      PT SHORT TERM GOAL #1   Title STG's=LTG's.           PT Long Term Goals - 06/23/16 1342      PT LONG TERM GOAL #1   Title Independent with an HEP.   Time 8   Period Weeks   Status On-going     PT LONG TERM GOAL #2   Title Active right shoulder flexion to 150 degrees so the patient can easily reach overhead.   Time 8   Period Weeks   Status On-going     PT LONG TERM GOAL #3   Title Active ER to 70 degrees+ to allow for easily donning/doffing of apparel   Time 8   Period Weeks   Status On-going     PT LONG TERM GOAL #4  Title Increase right shoulder strength to a solid 4+/5 to increase stability for performance of functional activities   Time 8   Period Weeks   Status On-going     PT LONG TERM GOAL #5   Title Perform ADL's with pain not > 3/10.   Time 8   Period Weeks   Status On-going             Patient will benefit from skilled therapeutic intervention in order to improve the following deficits and impairments:  Pain, Decreased activity tolerance, Decreased range of motion  Visit Diagnosis: Acute pain of right shoulder  Stiffness of right shoulder, not elsewhere classified       G-Codes - 08/11/16 1607    Functional Assessment Tool Used (Outpatient Only) 10th visit.Marland Kitchenclinical judgement.   Functional Limitation Carrying, moving and handling objects   Carrying, Moving and Handling Objects Current Status [phone]) At least 40 percent but less than 60 percent impaired, limited or restricted   Carrying, Moving and Handling Objects Goal Status [id number]) At least 1 percent but less than 20 percent impaired, limited or restricted      Problem List Patient Active Problem  List   Diagnosis Date Noted  . Healthcare maintenance 02/12/2016  . Migraine headache with aura 02/12/2016  . Diabetic neuropathy ([place]) 02/06/2016  . Depression   . Encounter for preadmission testing   . Herpes genitalis in women 07/16/2015  . Genital lesion, female 07/12/2015  . Chronic anticoagulation 05/30/2015  . Family history of coronary artery disease in sister 05/30/2015  . Acute on chronic diastolic CHF (congestive heart failure), NYHA class 1 ([place]) 05/30/2015  . Chest pain with moderate risk of acute coronary syndrome 05/29/2015  . Pain in the chest   . Dyspnea   . Essential hypertension   . RLS (restless legs syndrome) 04/27/2015  . Fall 02/26/2015  . Back pain 02/26/2015  . Lipoma 02/08/2015  . Bipolar disorder ([place]) 01/23/2015  . Insomnia 01/23/2015  . CKD stage 3 due to type 2 diabetes mellitus ([place]) 01/05/2015  . Chronic back pain 01/04/2015  . Permanent atrial fibrillation ([place]) 01/04/2015  . Pruritus 01/04/2015  . Diabetes mellitus with stage 2 chronic kidney disease ([place])   . Hyperlipidemia     Landyn Lorincz, [place] MPT 08/11/16, 4:23 PM  [facility] [address] [place], [place], [address] Phone: [phone]   Fax:  [phone]  Name: Amber Stephenson MRN: [id number] Date of Birth: 06-19-1945

## 2016-08-04 ENCOUNTER — Ambulatory Visit: Payer: Medicare Other | Admitting: Physical Therapy

## 2016-08-04 ENCOUNTER — Encounter: Payer: Self-pay | Admitting: Physical Therapy

## 2016-08-04 DIAGNOSIS — E559 Vitamin D deficiency, unspecified: Secondary | ICD-10-CM | POA: Diagnosis not present

## 2016-08-04 DIAGNOSIS — M25511 Pain in right shoulder: Secondary | ICD-10-CM | POA: Diagnosis not present

## 2016-08-04 DIAGNOSIS — I10 Essential (primary) hypertension: Secondary | ICD-10-CM | POA: Diagnosis not present

## 2016-08-04 DIAGNOSIS — R809 Proteinuria, unspecified: Secondary | ICD-10-CM | POA: Diagnosis not present

## 2016-08-04 DIAGNOSIS — N183 Chronic kidney disease, stage 3 (moderate): Secondary | ICD-10-CM | POA: Diagnosis not present

## 2016-08-04 DIAGNOSIS — D649 Anemia, unspecified: Secondary | ICD-10-CM | POA: Diagnosis not present

## 2016-08-04 DIAGNOSIS — M25611 Stiffness of right shoulder, not elsewhere classified: Secondary | ICD-10-CM | POA: Diagnosis not present

## 2016-08-04 DIAGNOSIS — Z79899 Other long term (current) drug therapy: Secondary | ICD-10-CM | POA: Diagnosis not present

## 2016-08-04 NOTE — Therapy (Signed)
[facility] [place], [place], [address] Phone: [phone]   Fax:  [phone]  Physical Therapy Treatment  Patient Details  Name: Amber Stephenson MRN: [id number] Date of Birth: 10/14/1945 Referring Provider: Netta Cedars MD.  Encounter Date: 08/04/2016      PT End of Session - 08/04/16 1521    Visit Number 11   Number of Visits 12   Date for PT Re-Evaluation 08/11/16   PT Start Time 1518   PT Stop Time 1608   PT Time Calculation (min) 50 min   Activity Tolerance Patient tolerated treatment well   Behavior During Therapy [facility] for tasks assessed/performed      Past Medical History:  Diagnosis Date  . Atrial fibrillation ([place])   . Bipolar affective ([place])   . CHF (congestive heart failure) ([place])   . Chronic back pain   . Diabetes mellitus without complication ([place])   . Hyperlipidemia   . Hypertension   . Pain management   . Renal insufficiency     Past Surgical History:  Procedure Laterality Date  . BREAST REDUCTION SURGERY    . SHOULDER SURGERY Right   . THIGH SURGERY      There were no vitals filed for this visit.      Subjective Assessment - 08/04/16 1520    Subjective Provided lastest MD note from [facility] from 07/16/2016. Reports that she has been doing a lot of things around the house like laundry, etc.   Patient Stated Goals Regain use of my right shoulder.   Currently in Pain? Yes   Pain Score 7    Pain Location Shoulder   Pain Orientation Right   Pain Descriptors / Indicators Throbbing   Pain Type Surgical pain   Pain Onset 1 to 4 weeks ago            [facility] PT Assessment - 08/04/16 0001      Assessment   Medical Diagnosis S/p right shoulder arthoscopy.   Onset Date/Surgical Date 06/09/16   Next MD Visit 08/14/2016     Restrictions   Weight Bearing Restrictions No                     [facility] Adult PT Treatment/Exercise - 08/04/16 0001      Shoulder  Exercises: Supine   Protraction AAROM;Both;5 reps  stopped secondary to fatigue     Shoulder Exercises: Pulleys   Flexion Other (comment)  x5 min   Other Pulley Exercises Seated UE ranger into flex/ CW and CCW circles x20 reps each     Shoulder Exercises: Isometric Strengthening   Flexion Other (comment)  5 sec hold x10 reps   Extension Other (comment)  5 sec hold x10 reps   External Rotation Other (comment)  5 sec hold x10 reps   Internal Rotation Other (comment)  5 sec hold x10 reps     Modalities   Modalities Electrical Stimulation;Moist Heat     Moist Heat Therapy   Number Minutes Moist Heat 15 Minutes   Moist Heat Location Shoulder     Electrical Stimulation   Electrical Stimulation Location R shoulder   Electrical Stimulation Action Pre-Mod   Electrical Stimulation Parameters 80-150 hz x15 min   Electrical Stimulation Goals Pain     Manual Therapy   Manual Therapy Soft tissue mobilization;Passive ROM   Soft tissue mobilization Gentle STW to R anterior shoulder/ deltoids to reduce tone and pain   Passive ROM PROM of R  shoulder into flexion and ER with holds at end range                PT Education - 08/04/16 1556    Education provided Yes   Education Details HEP- isometric flexion, extension, ER, IR   Person(s) Educated Patient   Methods Explanation;Demonstration;Verbal cues;Handout   Comprehension Verbalized understanding;Returned demonstration;Verbal cues required          PT Short Term Goals - 06/12/16 1417      PT SHORT TERM GOAL #1   Title STG's=LTG's.           PT Long Term Goals - 06/23/16 1342      PT LONG TERM GOAL #1   Title Independent with an HEP.   Time 8   Period Weeks   Status On-going     PT LONG TERM GOAL #2   Title Active right shoulder flexion to 150 degrees so the patient can easily reach overhead.   Time 8   Period Weeks   Status On-going     PT LONG TERM GOAL #3   Title Active ER to 70 degrees+ to allow for  easily donning/doffing of apparel   Time 8   Period Weeks   Status On-going     PT LONG TERM GOAL #4   Title Increase right shoulder strength to a solid 4+/5 to increase stability for performance of functional activities   Time 8   Period Weeks   Status On-going     PT LONG TERM GOAL #5   Title Perform ADL's with pain not > 3/10.   Time 8   Period Weeks   Status On-going               Plan - 08/04/16 1558    Clinical Impression Statement Patient tolerated today's treatment fairly well as she was limited secondary to pain. R shoulder isometrics were intiated today secondary to wishes of MD with last note from MD. Patient guided through isometrics with moderate multimodal cueing for technique and positioning. Following isometrics patient was provided cane for AAROM supine exercises but could not complete more than 5 reps of protraction with cane secondary to shoulder fatigue. Patient also observed during treatment with SOB and coughing. Patient limited with PROM of R shoulder past 90 deg of flexion secondary to pain in anterior R shoulder. Gentle STW completed to assist with reduction of pain and tone. Normal modalities response noted following removal of the modalities. Patient provided HEP for isometrics for home with education regarding parameters and technique. Patient also educated to scale back on intensity of ADLs and activities she does at home to reduce risk of pain but to avoid not using RUE at all.   Rehab Potential Good   Clinical Impairments Affecting Rehab Potential surgery 06/09/16 current 2 weeks 06/23/16   PT Frequency 3x / week   PT Duration 4 weeks   PT Treatment/Interventions ADLs/Self Care Home Management;Electrical Stimulation;Moist Heat;Ultrasound;Patient/family education;Neuromuscular re-education;Therapeutic exercise;Therapeutic activities;Manual techniques;Passive range of motion;Vasopneumatic Device   PT Next Visit Plan Continue with R shoulder ROM and  isometrics per MD order in media with modalities and PROM per MPT POC.   PT Home Exercise Plan HEP- isometrics   Consulted and Agree with Plan of Care Patient      Patient will benefit from skilled therapeutic intervention in order to improve the following deficits and impairments:  Pain, Decreased activity tolerance, Decreased range of motion  Visit Diagnosis: Acute pain of right shoulder  Stiffness of right shoulder, not elsewhere classified     Problem List Patient Active Problem List   Diagnosis Date Noted  . Healthcare maintenance 02/12/2016  . Migraine headache with aura 02/12/2016  . Diabetic neuropathy ([place]) 02/06/2016  . Depression   . Encounter for preadmission testing   . Herpes genitalis in women 07/16/2015  . Genital lesion, female 07/12/2015  . Chronic anticoagulation 05/30/2015  . Family history of coronary artery disease in sister 05/30/2015  . Acute on chronic diastolic CHF (congestive heart failure), NYHA class 1 ([place]) 05/30/2015  . Chest pain with moderate risk of acute coronary syndrome 05/29/2015  . Pain in the chest   . Dyspnea   . Essential hypertension   . RLS (restless legs syndrome) 04/27/2015  . Fall 02/26/2015  . Back pain 02/26/2015  . Lipoma 02/08/2015  . Bipolar disorder ([place]) 01/23/2015  . Insomnia 01/23/2015  . CKD stage 3 due to type 2 diabetes mellitus ([place]) 01/05/2015  . Chronic back pain 01/04/2015  . Permanent atrial fibrillation ([place]) 01/04/2015  . Pruritus 01/04/2015  . Diabetes mellitus with stage 2 chronic kidney disease ([place])   . Hyperlipidemia     Wynelle Fanny, PTA 08/04/2016, 4:51 PM  [facility] [address] [place], [place], [address] Phone: [phone]   Fax:  [phone]  Name: Amber Stephenson MRN: [id number] Date of Birth: July 20, 1945

## 2016-08-04 NOTE — Patient Instructions (Addendum)
Strengthening: Isometric Flexion    Using wall for resistance, press right fist into ball using light pressure. Hold __5__ seconds. Repeat _10___ times per set. Do ____ sets per session. Do __2-3__ sessions per day.  http://orth.exer.us/800   Copyright  VHI. All rights reserved.  Strengthening: Isometric Extension    Using wall for resistance, press back of right arm into ball using light pressure. Hold __5__ seconds. Repeat __10__ times per set. Do ____ sets per session. Do __2-3__ sessions per day.  http://orth.exer.us/804   Copyright  VHI. All rights reserved.  Strengthening: Isometric External Rotation    Using wall to provide resistance, and keeping left arm at side, press back of right hand into ball using light pressure. Hold __5__ seconds. Repeat __10__ times per set. Do ____ sets per session. Do _2-3___ sessions per day.  http://orth.exer.us/814   Copyright  VHI. All rights reserved.  Strengthening: Isometric Internal Rotation    Using door frame for resistance, press palm of right hand into ball using light pressure. Keep elbow in at side. Hold __5__ seconds. Repeat __10__ times per set. Do ____ sets per session. Do _2-3___ sessions per day.  http://orth.exer.us/816   Copyright  VHI. All rights reserved.

## 2016-08-06 DIAGNOSIS — N179 Acute kidney failure, unspecified: Secondary | ICD-10-CM | POA: Diagnosis not present

## 2016-08-06 DIAGNOSIS — E875 Hyperkalemia: Secondary | ICD-10-CM | POA: Diagnosis not present

## 2016-08-06 DIAGNOSIS — E559 Vitamin D deficiency, unspecified: Secondary | ICD-10-CM | POA: Diagnosis not present

## 2016-08-06 DIAGNOSIS — N184 Chronic kidney disease, stage 4 (severe): Secondary | ICD-10-CM | POA: Diagnosis not present

## 2016-08-06 DIAGNOSIS — R809 Proteinuria, unspecified: Secondary | ICD-10-CM | POA: Diagnosis not present

## 2016-08-07 ENCOUNTER — Ambulatory Visit: Payer: Medicare Other | Admitting: Physical Therapy

## 2016-08-07 DIAGNOSIS — M25611 Stiffness of right shoulder, not elsewhere classified: Secondary | ICD-10-CM

## 2016-08-07 DIAGNOSIS — M25511 Pain in right shoulder: Secondary | ICD-10-CM | POA: Diagnosis not present

## 2016-08-07 NOTE — Therapy (Signed)
[facility] [place], [place], [address] Phone: [phone]   Fax:  [phone]  Physical Therapy Treatment  Patient Details  Name: Amber Stephenson MRN: [id number] Date of Birth: September 05, 1945 Referring Provider: Netta Cedars MD.  Encounter Date: 08/07/2016      PT End of Session - 08/07/16 1441    Visit Number 13   Number of Visits 24   Date for PT Re-Evaluation 08/11/16   PT Start Time 0145   PT Stop Time 0225   PT Time Calculation (min) 40 min   Activity Tolerance Patient tolerated treatment well   Behavior During Therapy [facility] for tasks assessed/performed      Past Medical History:  Diagnosis Date  . Atrial fibrillation ([place])   . Bipolar affective ([place])   . CHF (congestive heart failure) ([place])   . Chronic back pain   . Diabetes mellitus without complication ([place])   . Hyperlipidemia   . Hypertension   . Pain management   . Renal insufficiency     Past Surgical History:  Procedure Laterality Date  . BREAST REDUCTION SURGERY    . SHOULDER SURGERY Right   . THIGH SURGERY      There were no vitals filed for this visit.      Subjective Assessment - 08/07/16 1440    Subjective Not feeling real good today.   Patient Stated Goals Regain use of my right shoulder.   Pain Score 7    Pain Location Shoulder   Pain Orientation Right   Pain Onset 1 to 4 weeks ago     Treatment:  Pulleys x 5 minutes f/b UE Ranger seated x 5 minutes f/n AAROM x 13 minutes in supine to patient's right shoulder into flexion and ER f/b Pre-mod e'stim with HMP x 7 minutes (patient requested to conclude treatment early).                                PT Short Term Goals - 06/12/16 1417      PT SHORT TERM GOAL #1   Title STG's=LTG's.           PT Long Term Goals - 06/23/16 1342      PT LONG TERM GOAL #1   Title Independent with an HEP.   Time 8   Period Weeks   Status On-going     PT LONG TERM GOAL  #2   Title Active right shoulder flexion to 150 degrees so the patient can easily reach overhead.   Time 8   Period Weeks   Status On-going     PT LONG TERM GOAL #3   Title Active ER to 70 degrees+ to allow for easily donning/doffing of apparel   Time 8   Period Weeks   Status On-going     PT LONG TERM GOAL #4   Title Increase right shoulder strength to a solid 4+/5 to increase stability for performance of functional activities   Time 8   Period Weeks   Status On-going     PT LONG TERM GOAL #5   Title Perform ADL's with pain not > 3/10.   Time 8   Period Weeks   Status On-going               Plan - 08/07/16 1445    Clinical Impression Statement Patient not feeling well today wanting to go easier.      Patient  will benefit from skilled therapeutic intervention in order to improve the following deficits and impairments:     Visit Diagnosis: Acute pain of right shoulder  Stiffness of right shoulder, not elsewhere classified     Problem List Patient Active Problem List   Diagnosis Date Noted  . Healthcare maintenance 02/12/2016  . Migraine headache with aura 02/12/2016  . Diabetic neuropathy ([place]) 02/06/2016  . Depression   . Encounter for preadmission testing   . Herpes genitalis in women 07/16/2015  . Genital lesion, female 07/12/2015  . Chronic anticoagulation 05/30/2015  . Family history of coronary artery disease in sister 05/30/2015  . Acute on chronic diastolic CHF (congestive heart failure), NYHA class 1 ([place]) 05/30/2015  . Chest pain with moderate risk of acute coronary syndrome 05/29/2015  . Pain in the chest   . Dyspnea   . Essential hypertension   . RLS (restless legs syndrome) 04/27/2015  . Fall 02/26/2015  . Back pain 02/26/2015  . Lipoma 02/08/2015  . Bipolar disorder ([place]) 01/23/2015  . Insomnia 01/23/2015  . CKD stage 3 due to type 2 diabetes mellitus ([place]) 01/05/2015  . Chronic back pain 01/04/2015  . Permanent atrial fibrillation  ([place]) 01/04/2015  . Pruritus 01/04/2015  . Diabetes mellitus with stage 2 chronic kidney disease ([place])   . Hyperlipidemia     Joandy Burget, [place] MPT 08/07/2016, 2:50 PM  [facility] [address] [place], [place], [address] Phone: [phone]   Fax:  [phone]  Name: Tykeisha Peer MRN: [id number] Date of Birth: 09-20-1945

## 2016-08-08 DIAGNOSIS — H25013 Cortical age-related cataract, bilateral: Secondary | ICD-10-CM | POA: Diagnosis not present

## 2016-08-08 DIAGNOSIS — H2513 Age-related nuclear cataract, bilateral: Secondary | ICD-10-CM | POA: Diagnosis not present

## 2016-08-09 DIAGNOSIS — N179 Acute kidney failure, unspecified: Secondary | ICD-10-CM | POA: Diagnosis not present

## 2016-08-11 ENCOUNTER — Encounter: Payer: Self-pay | Admitting: Family Medicine

## 2016-08-11 ENCOUNTER — Ambulatory Visit (INDEPENDENT_AMBULATORY_CARE_PROVIDER_SITE_OTHER): Payer: Medicare Other | Admitting: Family Medicine

## 2016-08-11 VITALS — BP 122/83 | HR 83 | Temp 96.7°F | Ht 62.0 in | Wt 187.2 lb

## 2016-08-11 DIAGNOSIS — E1122 Type 2 diabetes mellitus with diabetic chronic kidney disease: Secondary | ICD-10-CM

## 2016-08-11 DIAGNOSIS — N182 Chronic kidney disease, stage 2 (mild): Secondary | ICD-10-CM | POA: Diagnosis not present

## 2016-08-11 DIAGNOSIS — N184 Chronic kidney disease, stage 4 (severe): Secondary | ICD-10-CM | POA: Diagnosis not present

## 2016-08-11 DIAGNOSIS — G479 Sleep disorder, unspecified: Secondary | ICD-10-CM

## 2016-08-11 MED ORDER — PREGABALIN 150 MG PO CAPS: 150.0000 mg | ORAL_CAPSULE | Freq: Two times a day (BID) | ORAL | 0 refills | Status: DC

## 2016-08-11 NOTE — Patient Instructions (Signed)
Great to see you!  Please Talk to Dr. Casimiro Needle about possibly a small amount of xanax  I would consider your maximum dose of lyrica to be 300 mg daily

## 2016-08-11 NOTE — Progress Notes (Signed)
   HPI  Patient presents today to discuss changing diabetic medications, kidney disease, and difficulty sleeping.  Patient states that she has not slept in 2-3 days. She states this happens a few times a year. She requests a small amount of Xanax to help her sleep. She initially tells me that her psychiatrist is not prescribed any medications except for Abilify. After discussing medications family [place], controlled substance database she states that she was taken off of that medication.  According to the [place] on a controlled substance database she filled a prescription of Sonata last week for 3 months supply.   PMH: Smoking status noted ROS: Per HPI  Objective: BP 122/83   Pulse 83   Temp (!) 96.7 F (35.9 C) (Oral)   Ht 5\' 2"  (1.575 m)   Wt 187 lb 3.2 oz (84.9 kg)   BMI 34.24 kg/m  Gen: NAD, alert, cooperative with exam HEENT: NCAT, EOMI, PERRL CV: RRR, good S1/S2, no murmur Resp: CTABL, no wheezes, non-labored Ext: No edema, warm Neuro: Alert and oriented, No gross deficits  Assessment and plan:  # Type 2 diabetes Clinically stable, A1c do, however she usually follows with endocrinology. Patient will call for follow-up as soon as possible. She has had metformin discontinued due to decreased GFR, I recommended no additional medications. She is also on Bydureon. Continue Lantus at current dose.  # Chronic kidney disease stage IV Patient needs refill of Lyrica, based on her last creatinine, her clearance is 31, this would make her maximum dose of daily Lyrica at 300 mg Nephrology has been following her labs very closely so these were not repeated today.  # difficulty sleeping On review of [place] a controlled substance database she is prescribed Sonata by her psychiatrist, I recommended that she contact him for additional medications if needed. I declined her request for Xanax today, however she also has temazepam prescribed by the same psychiatrist.    Meds  ordered this encounter  Medications  . pregabalin (LYRICA) 150 MG capsule    Sig: Take 1 capsule (150 mg total) by mouth 2 (two) times daily.    Dispense:  180 capsule    Refill:  0  . pregabalin (LYRICA) 150 MG capsule    Sig: Take 1 capsule (150 mg total) by mouth 2 (two) times daily.    Dispense:  28 capsule    Refill:  [place], MD [facility] 08/11/2016, 12:18 PM

## 2016-08-12 ENCOUNTER — Ambulatory Visit: Payer: Medicare Other | Admitting: Physical Therapy

## 2016-08-12 DIAGNOSIS — M25611 Stiffness of right shoulder, not elsewhere classified: Secondary | ICD-10-CM | POA: Diagnosis not present

## 2016-08-12 DIAGNOSIS — M25511 Pain in right shoulder: Secondary | ICD-10-CM

## 2016-08-12 NOTE — Therapy (Signed)
[facility] [place], [place], [address] Phone: [phone]   Fax:  [phone]  Physical Therapy Treatment  Patient Details  Name: Amber Stephenson MRN: [id number] Date of Birth: December 23, 1945 Referring Provider: Netta Cedars MD.  Encounter Date: 08/12/2016      PT End of Session - 08/12/16 1613    Visit Number 14   Number of Visits 24   Date for PT Re-Evaluation 08/11/16   PT Start Time 0315   PT Stop Time 0416   PT Time Calculation (min) 61 min   Activity Tolerance Patient tolerated treatment well   Behavior During Therapy [facility] for tasks assessed/performed      Past Medical History:  Diagnosis Date  . Atrial fibrillation ([place])   . Bipolar affective ([place])   . CHF (congestive heart failure) ([place])   . Chronic back pain   . Diabetes mellitus without complication ([place])   . Hyperlipidemia   . Hypertension   . Pain management   . Renal insufficiency     Past Surgical History:  Procedure Laterality Date  . BREAST REDUCTION SURGERY    . SHOULDER SURGERY Right   . THIGH SURGERY      There were no vitals filed for this visit.      Subjective Assessment - 08/12/16 1610    Subjective I'm in a lot of pain today.   Patient Stated Goals Regain use of my right shoulder.   Pain Score 8       Patient in a lot of pain today and is very tender to palpation over her right anterior shoulder region.  She achieved active assistive right shoulder flexion to 125 degrees today and 60 degrees of ER.                    [facility] Adult PT Treatment/Exercise - 08/12/16 0001      Shoulder Exercises: Pulleys   Flexion Limitations 5 minutes.   Other Pulley Exercises Seated UE Ranger x  5 minutes.     Modalities   Modalities Ultrasound     Electrical Stimulation   Electrical Stimulation Location Right shoulder.   Electrical Stimulation Action Pre-mod.   Electrical Stimulation Parameters 80-150 Hz x 15 minutes.   Electrical Stimulation Goals Pain     Ultrasound   Ultrasound Location Right anterior shoulder.   Ultrasound Parameters Non-motoric combo e'stim/U/S at 1.50 x 13 minutes.     Manual Therapy   Passive ROM AAROM into right shoulder flexion and ER x 5 minutes.                  PT Short Term Goals - 06/12/16 1417      PT SHORT TERM GOAL #1   Title STG's=LTG's.           PT Long Term Goals - 06/23/16 1342      PT LONG TERM GOAL #1   Title Independent with an HEP.   Time 8   Period Weeks   Status On-going     PT LONG TERM GOAL #2   Title Active right shoulder flexion to 150 degrees so the patient can easily reach overhead.   Time 8   Period Weeks   Status On-going     PT LONG TERM GOAL #3   Title Active ER to 70 degrees+ to allow for easily donning/doffing of apparel   Time 8   Period Weeks   Status On-going     PT LONG TERM  GOAL #4   Title Increase right shoulder strength to a solid 4+/5 to increase stability for performance of functional activities   Time 8   Period Weeks   Status On-going     PT LONG TERM GOAL #5   Title Perform ADL's with pain not > 3/10.   Time 8   Period Weeks   Status On-going               Plan - 08/12/16 1611    Clinical Impression Statement Patient in a lot of pain today and is very tender to palpation over her right anterior shoulder region.  She achieved active assistive right shoulder flexion to 125 degrees today and 60 degrees of ER.   PT Treatment/Interventions ADLs/Self Care Home Management;Electrical Stimulation;Moist Heat;Ultrasound;Patient/family education;Neuromuscular re-education;Therapeutic exercise;Therapeutic activities;Manual techniques;Passive range of motion;Vasopneumatic Device      Patient will benefit from skilled therapeutic intervention in order to improve the following deficits and impairments:  Pain, Decreased activity tolerance, Decreased range of motion  Visit Diagnosis: Acute pain of  right shoulder  Stiffness of right shoulder, not elsewhere classified     Problem List Patient Active Problem List   Diagnosis Date Noted  . Healthcare maintenance 02/12/2016  . Migraine headache with aura 02/12/2016  . Diabetic neuropathy ([place]) 02/06/2016  . Depression   . Encounter for preadmission testing   . Herpes genitalis in women 07/16/2015  . Genital lesion, female 07/12/2015  . Chronic anticoagulation 05/30/2015  . Family history of coronary artery disease in sister 05/30/2015  . Acute on chronic diastolic CHF (congestive heart failure), NYHA class 1 ([place]) 05/30/2015  . Chest pain with moderate risk of acute coronary syndrome 05/29/2015  . Pain in the chest   . Dyspnea   . Essential hypertension   . RLS (restless legs syndrome) 04/27/2015  . Fall 02/26/2015  . Back pain 02/26/2015  . Lipoma 02/08/2015  . Bipolar disorder ([place]) 01/23/2015  . Insomnia 01/23/2015  . CKD stage 3 due to type 2 diabetes mellitus ([place]) 01/05/2015  . Chronic back pain 01/04/2015  . Permanent atrial fibrillation ([place]) 01/04/2015  . Pruritus 01/04/2015  . Diabetes mellitus with stage 2 chronic kidney disease ([place])   . Hyperlipidemia     Edras Wilford, [place] MPT 08/12/2016, 4:20 PM  [facility] [address] [place], [place], [address] Phone: [phone]   Fax:  [phone]  Name: Amber Stephenson MRN: [id number] Date of Birth: 1946/01/31

## 2016-08-14 DIAGNOSIS — Z4789 Encounter for other orthopedic aftercare: Secondary | ICD-10-CM | POA: Diagnosis not present

## 2016-08-14 DIAGNOSIS — T8484XA Pain due to internal orthopedic prosthetic devices, implants and grafts, initial encounter: Secondary | ICD-10-CM | POA: Diagnosis not present

## 2016-08-16 DIAGNOSIS — N179 Acute kidney failure, unspecified: Secondary | ICD-10-CM | POA: Diagnosis not present

## 2016-08-19 ENCOUNTER — Ambulatory Visit: Payer: Medicare Other | Admitting: Physical Therapy

## 2016-08-19 DIAGNOSIS — M25511 Pain in right shoulder: Secondary | ICD-10-CM | POA: Diagnosis not present

## 2016-08-19 DIAGNOSIS — M25611 Stiffness of right shoulder, not elsewhere classified: Secondary | ICD-10-CM

## 2016-08-19 NOTE — Therapy (Signed)
[facility] [place], [place], [address] Phone: [phone]   Fax:  [phone]  Physical Therapy Treatment  Patient Details  Name: Amber Stephenson MRN: [id number] Date of Birth: Oct 15, 1945 Referring Provider: Netta Cedars MD.  Encounter Date: 08/19/2016      PT End of Session - 08/19/16 1302    Visit Number 15   Number of Visits 27   Date for PT Re-Evaluation 09/30/16  N.O.    PT Start Time 1301   PT Stop Time 1354   PT Time Calculation (min) 53 min   Activity Tolerance Patient tolerated treatment well   Behavior During Therapy WFL for tasks assessed/performed      Past Medical History:  Diagnosis Date  . Atrial fibrillation ([place])   . Bipolar affective ([place])   . CHF (congestive heart failure) ([place])   . Chronic back pain   . Diabetes mellitus without complication ([place])   . Hyperlipidemia   . Hypertension   . Pain management   . Renal insufficiency     Past Surgical History:  Procedure Laterality Date  . BREAST REDUCTION SURGERY    . SHOULDER SURGERY Right   . THIGH SURGERY      There were no vitals filed for this visit.      Subjective Assessment - 08/19/16 1303    Subjective Patient reports the [place] las week helped a lot. She also reports seeing her MD last Wed and he wants her to continue therapy. NO was provided for 4-6 weeks.   Patient Stated Goals Regain use of my right shoulder.   Currently in Pain? Yes   Pain Score 3    Pain Location Shoulder   Pain Orientation Right   Pain Descriptors / Indicators Aching   Pain Type Surgical pain   Pain Onset More than a month ago   Pain Frequency Constant   Aggravating Factors  when someone touches it.   Pain Relieving Factors rest            [facility] PT Assessment - 08/19/16 0001      ROM / Strength   AROM / PROM / Strength AROM     AROM   AROM Assessment Site Shoulder   Right/Left Shoulder Right   Right Shoulder Flexion 110 Degrees  standing   Right Shoulder Internal Rotation --  thumb to L3   Right Shoulder External Rotation 45 Degrees  in standing                     [facility] Adult PT Treatment/Exercise - 08/19/16 0001      Shoulder Exercises: Prone   Horizontal ABduction 1 Strengthening;Right;15 reps  bent over table      Shoulder Exercises: Sidelying   Flexion Strengthening;Right;20 reps     Shoulder Exercises: Standing   External Rotation Strengthening;Right;20 reps;Theraband   Theraband Level (Shoulder External Rotation) Level 1 (Yellow)   Internal Rotation Strengthening;Right;20 reps;Theraband   Theraband Level (Shoulder Internal Rotation) Level 1 (Yellow)   Flexion Strengthening;Right;20 reps;Theraband   Theraband Level (Shoulder Flexion) Level 1 (Yellow)   ABduction Strengthening;Right;20 reps  shoulder height   Row Strengthening;Right;20 reps;Theraband   Theraband Level (Shoulder Row) Level 1 (Yellow)   Other Standing Exercises scaption 2x10     Shoulder Exercises: Pulleys   Flexion Limitations 5 minutes   Other Pulley Exercises Seated UE Ranger x  5 minutes.     Modalities   Modalities Electrical Stimulation;Moist Heat     Moist  Heat Therapy   Number Minutes Moist Heat 15 Minutes   Moist Heat Location Shoulder     Electrical Stimulation   Electrical Stimulation Location R shoulder   Electrical Stimulation Action premod   Electrical Stimulation Parameters 80-150 Hz x 15 min   Electrical Stimulation Goals Pain                  PT Short Term Goals - 06/12/16 1417      PT SHORT TERM GOAL #1   Title STG's=LTG's.           PT Long Term Goals - 08/19/16 1602      PT LONG TERM GOAL #1   Title Independent with an HEP.   Time 8   Period Weeks   Status On-going     PT LONG TERM GOAL #2   Title Active right shoulder flexion to 150 degrees so the patient can easily reach overhead.   Time 8   Period Weeks   Status On-going     PT LONG TERM GOAL #3   Title Active ER to  70 degrees+ to allow for easily donning/doffing of apparel   Time 8   Period Weeks   Status On-going     PT LONG TERM GOAL #4   Title Increase right shoulder strength to a solid 4+/5 to increase stability for performance of functional activities   Time 8   Period Weeks   Status On-going     PT LONG TERM GOAL #5   Title Perform ADL's with pain not > 3/10.   Time 8   Period Weeks   Status On-going               Plan - 08/19/16 1600    Clinical Impression Statement Patient presents today with decreased pain from last visit. She also has a new order for 2x/wk for 4-6 weeks (see media). She did well with advanced strengthening with no c/o increased pain just fatigue. She has weakness with shoulder flexion against gravity. LtGs are ongoing.   Rehab Potential Good   PT Frequency 3x / week   PT Duration 4 weeks   PT Treatment/Interventions ADLs/Self Care Home Management;Electrical Stimulation;Moist Heat;Ultrasound;Patient/family education;Neuromuscular re-education;Therapeutic exercise;Therapeutic activities;Manual techniques;Passive range of motion;Vasopneumatic Device   PT Next Visit Plan Continue with R shoulder ROM and strengthening per MD order in media with modalities.   Consulted and Agree with Plan of Care Patient      Patient will benefit from skilled therapeutic intervention in order to improve the following deficits and impairments:  Pain, Decreased activity tolerance, Decreased range of motion  Visit Diagnosis: Stiffness of right shoulder, not elsewhere classified  Acute pain of right shoulder     Problem List Patient Active Problem List   Diagnosis Date Noted  . Healthcare maintenance 02/12/2016  . Migraine headache with aura 02/12/2016  . Diabetic neuropathy ([place]) 02/06/2016  . Depression   . Encounter for preadmission testing   . Herpes genitalis in women 07/16/2015  . Genital lesion, female 07/12/2015  . Chronic anticoagulation 05/30/2015  . Family  history of coronary artery disease in sister 05/30/2015  . Acute on chronic diastolic CHF (congestive heart failure), NYHA class 1 ([place]) 05/30/2015  . Chest pain with moderate risk of acute coronary syndrome 05/29/2015  . Pain in the chest   . Dyspnea   . Essential hypertension   . RLS (restless legs syndrome) 04/27/2015  . Fall 02/26/2015  . Back pain 02/26/2015  . Lipoma  02/08/2015  . Bipolar disorder ([place]) 01/23/2015  . Insomnia 01/23/2015  . CKD stage 3 due to type 2 diabetes mellitus ([place]) 01/05/2015  . Chronic back pain 01/04/2015  . Permanent atrial fibrillation ([place]) 01/04/2015  . Pruritus 01/04/2015  . Diabetes mellitus with stage 2 chronic kidney disease ([place])   . Hyperlipidemia     Madelyn Flavors PT 08/19/2016, 4:11 PM  [facility] [address] [place], [place], [address] Phone: [phone]   Fax:  [phone]  Name: Amber Stephenson MRN: [id number] Date of Birth: 04-02-1946

## 2016-08-20 DIAGNOSIS — N183 Chronic kidney disease, stage 3 (moderate): Secondary | ICD-10-CM | POA: Diagnosis not present

## 2016-08-20 DIAGNOSIS — N179 Acute kidney failure, unspecified: Secondary | ICD-10-CM | POA: Diagnosis not present

## 2016-08-20 DIAGNOSIS — E870 Hyperosmolality and hypernatremia: Secondary | ICD-10-CM | POA: Diagnosis not present

## 2016-08-21 ENCOUNTER — Encounter (HOSPITAL_COMMUNITY): Payer: Self-pay | Admitting: Psychiatry

## 2016-08-21 ENCOUNTER — Ambulatory Visit (INDEPENDENT_AMBULATORY_CARE_PROVIDER_SITE_OTHER): Payer: Medicare Other | Admitting: Psychiatry

## 2016-08-21 VITALS — BP 120/80 | HR 70 | Ht 62.0 in | Wt 196.0 lb

## 2016-08-21 DIAGNOSIS — Z818 Family history of other mental and behavioral disorders: Secondary | ICD-10-CM | POA: Diagnosis not present

## 2016-08-21 DIAGNOSIS — F3162 Bipolar disorder, current episode mixed, moderate: Secondary | ICD-10-CM | POA: Diagnosis not present

## 2016-08-21 DIAGNOSIS — Z79899 Other long term (current) drug therapy: Secondary | ICD-10-CM

## 2016-08-21 MED ORDER — LORAZEPAM 1 MG PO TABS: ORAL_TABLET | ORAL | 2 refills | Status: DC

## 2016-08-21 MED ORDER — ARIPIPRAZOLE 10 MG PO TABS: ORAL_TABLET | ORAL | 2 refills | Status: DC

## 2016-08-21 NOTE — Progress Notes (Signed)
Patient ID: Amber Stephenson, female   DOB: 1945/08/25, 71 y.o.   MRN: [id number] [facility] MD Progress Note  08/21/2016 2:49 PM Amber Stephenson  MRN:  [id number] Subjective:  Doing well Principal Problem: Bipolar disorder most recent episode manic Diagnosis:  Bipolar disorder most recent  Today the patient is not doing well. She comes in the right away says please give reason Xanax I'm so anxious and irritable. She mainly describes significant irritability point where she is in conflict with everybody around her specifically her husband. She can tell this is similar to her mania. She really does not have a specific psychosocial stressor at this time. She was having some stresses with her daughter who is in jail then rehabilitation for heroin. Now she is out of rehabilitation and actually is doing well from the patient's perspective. She can tell when she is using and she doesn't believe she is. The patient's husband is doing fairly well. The patient did not talk about her shoulder surgery but apparently she physically feels okay. She does not describe herself as being hyperactive but she seems very tense in this visit. She describes her speech is being hyperactive although is not that me. She does describe racing thinking and distractibility but is sleeping well. She is no psychosis. She's not suicidal. She's been psychiatrically hospitalized in the last year or mania. The patient says she's had notes confirm this that she's had some kidney issues. That seems to be getting better. The patient does not drink any alcohol or use any drug herself or the patient is been trying to get into psychotherapy in her community but unfortunately there is nobody available. Past Medical History:  Past Medical History:  Diagnosis Date  . Atrial fibrillation ([place])   . Bipolar affective ([place])   . CHF (congestive heart failure) ([place])   . Chronic back pain   . Diabetes mellitus without complication ([place])   . Hyperlipidemia   .  Hypertension   . Pain management   . Renal insufficiency     Past Surgical History:  Procedure Laterality Date  . BREAST REDUCTION SURGERY    . SHOULDER SURGERY Right   . THIGH SURGERY     Family History:  Family History  Problem Relation Age of Onset  . Diabetes Mother   . Heart disease Mother   . Heart disease Brother   . Heart disease Sister     CABG  . Mental illness Brother   . Diabetes Brother    Family Psychiatric  History:  Social History:  History  Alcohol Use No     History  Drug Use No    Social History   Social History  . Marital status: Married    Spouse name: N/A  . Number of children: 3  . Years of education: N/A   Social History Main Topics  . Smoking status: Never Smoker  . Smokeless tobacco: Never Used  . Alcohol use No  . Drug use: No  . Sexual activity: Yes    Partners: Male    Birth control/ protection: Post-menopausal   Other Topics Concern  . None   Social History Narrative   Lives at home with husband.    Additional Social History:                         Sleep: Good  Appetite:  Good  Current Medications: Current Outpatient Prescriptions  Medication Sig Dispense Refill  . apixaban (ELIQUIS) 5 MG  TABS tablet Take 5 mg by mouth 2 (two) times daily.    . ARIPiprazole (ABILIFY) 10 MG tablet 3 qam 270 tablet 2  . atorvastatin (LIPITOR) 40 MG tablet TAKE 1 TABLET DAILY 90 tablet 0  . Blood Glucose Monitoring Suppl (FREESTYLE LITE) DEVI Use to check blood sugar tid. DX E11.22 1 each 0  . Cholecalciferol (VITAMIN D3) 1000 UNITS CAPS Take 1,000 Units by mouth daily.    . Exenatide ER (BYDUREON) 2 MG PEN Inject the contents of one pen once per week 12 each 1  . fluconazole (DIFLUCAN) 150 MG tablet Take 1 tablet (150 mg total) by mouth every three (3) days as needed. 3 tablet 0  . furosemide (LASIX) 20 MG tablet Take 1 tablet (20 mg total) by mouth 2 (two) times daily. 180 tablet 1  . glucose blood (FREESTYLE LITE) test  strip Test tid. DX E11.22 100 each 5  . Insulin Pen Needle (SURE COMFORT PEN NEEDLES) 32G X 4 MM MISC Use with insulin pens as directed. Dx E11.9 300 each 11  . Lancets (FREESTYLE) lancets Test tid. DX E11.22 100 each 5  . LANTUS SOLOSTAR 100 UNIT/ML Solostar Pen INJECT 38 UNITS UNDER THE SKIN TWICE A DAY 90 mL 2  . levalbuterol (XOPENEX) 1.25 MG/3ML nebulizer solution Take 1.25 mg by nebulization every 4 (four) hours as needed for wheezing. 72 mL 0  . lidocaine (LIDODERM) 5 % Place 1 patch onto the skin daily. 12 hours on 12 hours off 90 patch 5  . LORazepam (ATIVAN) 1 MG tablet 1  Bid   1 prn 90 tablet 2  . methocarbamol (ROBAXIN) 500 MG tablet     . metoprolol (LOPRESSOR) 100 MG tablet Take 1 tablet (100 mg total) by mouth 2 (two) times daily. 180 tablet 3  . potassium chloride (K-DUR,KLOR-CON) 10 MEQ tablet Take 10 meq once daily  0  . pregabalin (LYRICA) 150 MG capsule Take 1 capsule (150 mg total) by mouth 2 (two) times daily. 180 capsule 0  . pregabalin (LYRICA) 150 MG capsule Take 1 capsule (150 mg total) by mouth 2 (two) times daily. 28 capsule 0  . rizatriptan (MAXALT-MLT) 10 MG disintegrating tablet DISSOLVE 1 TABLET UNDER TONGUE IMMEDIATELY, MAY REPEAT DOSE IN 1 HOUR IF NEEDED- MAX OF 3 TABLETS IN 24 HOURS 10 tablet 0  . SUMAtriptan (IMITREX) 100 MG tablet Take one at onset of HA. May repeat in 2 hours if headache persists or recurs. Limit two per 24 hours 10 tablet 11  . temazepam (RESTORIL) 15 MG capsule 2  qhs 60 capsule 4  . tizanidine (ZANAFLEX) 6 MG capsule TAKE 1 CAPSULE THREE TIMES A DAY AS NEEDED FOR MUSCLE SPASMS 42 capsule 3  . triamcinolone ointment (KENALOG) 0.5 % Apply 1 application topically 2 (two) times daily. 30 g 0  . TRICOR 145 MG tablet TAKE 1 TABLET DAILY 90 tablet 0  . zaleplon (SONATA) 10 MG capsule 2  qhs 60 capsule 3   No current facility-administered medications for this visit.     Lab Results: No results found for this or any previous visit (from the  past 48 hour(s)).  Blood Alcohol level:  Lab Results  Component Value Date   ETH <5 10/01/2015    Physical Findings: AIMS:  , ,  ,  ,    CIWA:    COWS:     Musculoskeletal: Strength & Muscle Tone: within normal limits Gait & Station: normal Patient leans: N/A  Psychiatric Specialty Exam:  ROS  Blood pressure 120/80, pulse 70, height 5\' 2"  (1.575 m), weight 196 lb (88.9 kg).Body mass index is 35.85 kg/m.  General Appearance: Casual  Eye Contact::  Good  Speech:  Clear and Coherent  Volume:  Normal  Mood:  Euthymic  Affect:  Appropriate  Thought Process:  Coherent  Orientation:  Full (Time, Place, and Person)  Thought Content:  WDL  Suicidal Thoughts:  No  Homicidal Thoughts:  No  Memory:  NA  Judgement:  Good  Insight:  Good  Psychomotor Activity:  Normal  Concentration:  Good  Recall:  Good  Fund of Knowledge:Good  Language: Good  Akathisia:  No  Handed:  Right  AIMS (if indicated):     Assets: Good spirits   ADL's:  Intact  Cognition: WNL  Sleep:      Treatment Plan Summary:  At this time I believe the patient is developing mania. I do not think she is dangerous to herself or others. Today we'll go ahead and increase her Abilify to the maximum dose of 30 mg. We will add some Ativan 1 mg twice a day with 1 extra if she needs it. I will avoid lifting in this patient given her kidney problems. Patient is been on Depakote and has had excessive tremor from it. I will see the patient back in just 2 weeks. She was told to call if she doesn't feel better. I will probably bedtime begin her on Tegretol which she's never been on before. The patient agreed with these plans and will return to see me. We will make phone call contact with this patient in one week.

## 2016-08-22 ENCOUNTER — Other Ambulatory Visit: Payer: Self-pay | Admitting: General Surgery

## 2016-08-22 DIAGNOSIS — E119 Type 2 diabetes mellitus without complications: Secondary | ICD-10-CM | POA: Diagnosis not present

## 2016-08-22 DIAGNOSIS — F3160 Bipolar disorder, current episode mixed, unspecified: Secondary | ICD-10-CM | POA: Diagnosis not present

## 2016-08-22 DIAGNOSIS — Z7901 Long term (current) use of anticoagulants: Secondary | ICD-10-CM | POA: Diagnosis not present

## 2016-08-22 DIAGNOSIS — I4891 Unspecified atrial fibrillation: Secondary | ICD-10-CM | POA: Diagnosis not present

## 2016-08-22 DIAGNOSIS — Z794 Long term (current) use of insulin: Secondary | ICD-10-CM | POA: Diagnosis not present

## 2016-08-22 DIAGNOSIS — D1724 Benign lipomatous neoplasm of skin and subcutaneous tissue of left leg: Secondary | ICD-10-CM | POA: Diagnosis not present

## 2016-08-22 DIAGNOSIS — N183 Chronic kidney disease, stage 3 (moderate): Secondary | ICD-10-CM | POA: Diagnosis not present

## 2016-08-22 DIAGNOSIS — I129 Hypertensive chronic kidney disease with stage 1 through stage 4 chronic kidney disease, or unspecified chronic kidney disease: Secondary | ICD-10-CM | POA: Diagnosis not present

## 2016-08-22 DIAGNOSIS — I10 Essential (primary) hypertension: Secondary | ICD-10-CM | POA: Diagnosis not present

## 2016-08-26 ENCOUNTER — Encounter: Payer: Self-pay | Admitting: *Deleted

## 2016-08-28 ENCOUNTER — Ambulatory Visit: Payer: Medicare Other | Admitting: *Deleted

## 2016-08-28 DIAGNOSIS — M25511 Pain in right shoulder: Secondary | ICD-10-CM

## 2016-08-28 DIAGNOSIS — M25611 Stiffness of right shoulder, not elsewhere classified: Secondary | ICD-10-CM | POA: Diagnosis not present

## 2016-08-28 NOTE — Therapy (Signed)
[facility] [place], [place], [address] Phone: [phone]   Fax:  [phone]  Physical Therapy Treatment  Patient Details  Name: Amber Stephenson MRN: [id number] Date of Birth: Dec 25, 1945 Referring Provider: Netta Cedars MD.  Encounter Date: 08/28/2016      PT End of Session - 08/28/16 1424    Visit Number 16   Number of Visits 27   Date for PT Re-Evaluation 09/30/16   PT Start Time 2620   PT Stop Time 1435   PT Time Calculation (min) 50 min      Past Medical History:  Diagnosis Date  . Atrial fibrillation ([place])   . Bipolar affective ([place])   . CHF (congestive heart failure) ([place])   . Chronic back pain   . Diabetes mellitus without complication ([place])   . Hyperlipidemia   . Hypertension   . Pain management   . Renal insufficiency     Past Surgical History:  Procedure Laterality Date  . BREAST REDUCTION SURGERY    . SHOULDER SURGERY Right   . THIGH SURGERY      There were no vitals filed for this visit.                       [facility] Adult PT Treatment/Exercise - 08/28/16 0001      Modalities   Modalities Electrical Stimulation;Moist Heat     Moist Heat Therapy   Number Minutes Moist Heat 15 Minutes   Moist Heat Location Shoulder     Electrical Stimulation   Electrical Stimulation Location RT shldr premod x 15 mins  80-_0  hooklying   Electrical Stimulation Goals Pain     Ultrasound   Ultrasound Location RT shldr anterior aspect   Ultrasound Parameters 1.5 w/cm2 x 10 mins sitting   Ultrasound Goals Pain     Manual Therapy   Manual Therapy Soft tissue mobilization;Passive ROM   Passive ROM PROM of R shoulder into flexion and ER with holds at end range                  PT Short Term Goals - 06/12/16 1417      PT SHORT TERM GOAL #1   Title STG's=LTG's.           PT Long Term Goals - 08/19/16 1602      PT LONG TERM GOAL #1   Title Independent with an HEP.    Time 8   Period Weeks   Status On-going     PT LONG TERM GOAL #2   Title Active right shoulder flexion to 150 degrees so the patient can easily reach overhead.   Time 8   Period Weeks   Status On-going     PT LONG TERM GOAL #3   Title Active ER to 70 degrees+ to allow for easily donning/doffing of apparel   Time 8   Period Weeks   Status On-going     PT LONG TERM GOAL #4   Title Increase right shoulder strength to a solid 4+/5 to increase stability for performance of functional activities   Time 8   Period Weeks   Status On-going     PT LONG TERM GOAL #5   Title Perform ADL's with pain not > 3/10.   Time 8   Period Weeks   Status On-going               Plan - 08/28/16 1425    Clinical Impression Statement  Pt not doing well today due to twisting her back at home. She wanted to skip any therex today due to back pain. She did well with modalities and PROM for RT shldr. Her PROM was flexion to 155 degrees and ER to 55 degrees. No new LTGs met today.   Rehab Potential Good   Clinical Impairments Affecting Rehab Potential surgery 06/09/16 current 2 weeks 06/23/16   PT Frequency 3x / week   PT Duration 4 weeks   PT Next Visit Plan Continue with R shoulder ROM and strengthening per MD order in media with modalities.   Consulted and Agree with Plan of Care Patient      Patient will benefit from skilled therapeutic intervention in order to improve the following deficits and impairments:  Pain, Decreased activity tolerance, Decreased range of motion  Visit Diagnosis: Acute pain of right shoulder  Stiffness of right shoulder, not elsewhere classified     Problem List Patient Active Problem List   Diagnosis Date Noted  . Healthcare maintenance 02/12/2016  . Migraine headache with aura 02/12/2016  . Diabetic neuropathy ([place]) 02/06/2016  . Depression   . Encounter for preadmission testing   . Herpes genitalis in women 07/16/2015  . Genital lesion, female 07/12/2015   . Chronic anticoagulation 05/30/2015  . Family history of coronary artery disease in sister 05/30/2015  . Acute on chronic diastolic CHF (congestive heart failure), NYHA class 1 ([place]) 05/30/2015  . Chest pain with moderate risk of acute coronary syndrome 05/29/2015  . Pain in the chest   . Dyspnea   . Essential hypertension   . RLS (restless legs syndrome) 04/27/2015  . Fall 02/26/2015  . Back pain 02/26/2015  . Lipoma 02/08/2015  . Bipolar disorder ([place]) 01/23/2015  . Insomnia 01/23/2015  . CKD stage 3 due to type 2 diabetes mellitus ([place]) 01/05/2015  . Chronic back pain 01/04/2015  . Permanent atrial fibrillation ([place]) 01/04/2015  . Pruritus 01/04/2015  . Diabetes mellitus with stage 2 chronic kidney disease ([place])   . Hyperlipidemia     Christiana Gurevich,CHRIS, PTA 08/28/2016, 2:54 PM  [facility] [place], [place], [address] Phone: [phone]   Fax:  [phone]  Name: Amber Stephenson MRN: [id number] Date of Birth: 02-27-1946

## 2016-09-01 ENCOUNTER — Ambulatory Visit (INDEPENDENT_AMBULATORY_CARE_PROVIDER_SITE_OTHER): Payer: Medicare Other | Admitting: Family Medicine

## 2016-09-01 ENCOUNTER — Encounter: Payer: Self-pay | Admitting: Family Medicine

## 2016-09-01 VITALS — BP 119/77 | HR 86 | Temp 95.2°F | Ht 62.0 in | Wt 194.6 lb

## 2016-09-01 DIAGNOSIS — R300 Dysuria: Secondary | ICD-10-CM | POA: Diagnosis not present

## 2016-09-01 DIAGNOSIS — N3000 Acute cystitis without hematuria: Secondary | ICD-10-CM | POA: Diagnosis not present

## 2016-09-01 DIAGNOSIS — F3112 Bipolar disorder, current episode manic without psychotic features, moderate: Secondary | ICD-10-CM | POA: Diagnosis not present

## 2016-09-01 DIAGNOSIS — R143 Flatulence: Secondary | ICD-10-CM

## 2016-09-01 LAB — URINALYSIS, COMPLETE
Bilirubin, UA: NEGATIVE
Glucose, UA: NEGATIVE
Nitrite, UA: NEGATIVE
Protein, UA: NEGATIVE
RBC, UA: NEGATIVE
Specific Gravity, UA: 1.015 (ref 1.005–1.030)
Urobilinogen, Ur: 1 mg/dL (ref 0.2–1.0)
pH, UA: 5 (ref 5.0–7.5)

## 2016-09-01 LAB — MICROSCOPIC EXAMINATION: Renal Epithel, UA: >10 /hpf — AB

## 2016-09-01 MED ORDER — DOXYCYCLINE HYCLATE 100 MG PO TABS: 100.0000 mg | ORAL_TABLET | Freq: Two times a day (BID) | ORAL | 0 refills | Status: DC

## 2016-09-01 NOTE — Patient Instructions (Signed)
Great to see you!   Urinary Tract Infection, Adult A urinary tract infection (UTI) is an infection of any part of the urinary tract, which includes the kidneys, ureters, bladder, and urethra. These organs make, store, and get rid of urine in the body. UTI can be a bladder infection (cystitis) or kidney infection (pyelonephritis). What are the causes? This infection may be caused by fungi, viruses, or bacteria. Bacteria are the most common cause of UTIs. This condition can also be caused by repeated incomplete emptying of the bladder during urination. What increases the risk? This condition is more likely to develop if:  You ignore your need to urinate or hold urine for long periods of time.  You do not empty your bladder completely during urination.  You wipe back to front after urinating or having a bowel movement, if you are female.  You are uncircumcised, if you are female.  You are constipated.  You have a urinary catheter that stays in place (indwelling).  You have a weak defense (immune) system.  You have a medical condition that affects your bowels, kidneys, or bladder.  You have diabetes.  You take antibiotic medicines frequently or for long periods of time, and the antibiotics no longer work well against certain types of infections (antibiotic resistance).  You take medicines that irritate your urinary tract.  You are exposed to chemicals that irritate your urinary tract.  You are female.  What are the signs or symptoms? Symptoms of this condition include:  Fever.  Frequent urination or passing small amounts of urine frequently.  Needing to urinate urgently.  Pain or burning with urination.  Urine that smells bad or unusual.  Cloudy urine.  Pain in the lower abdomen or back.  Trouble urinating.  Blood in the urine.  Vomiting or being less hungry than normal.  Diarrhea or abdominal pain.  Vaginal discharge, if you are female.  How is this  diagnosed? This condition is diagnosed with a medical history and physical exam. You will also need to provide a urine sample to test your urine. Other tests may be done, including:  Blood tests.  Sexually transmitted disease (STD) testing.  If you have had more than one UTI, a cystoscopy or imaging studies may be done to determine the cause of the infections. How is this treated? Treatment for this condition often includes a combination of two or more of the following:  Antibiotic medicine.  Other medicines to treat less common causes of UTI.  Over-the-counter medicines to treat pain.  Drinking enough water to stay hydrated.  Follow these instructions at home:  Take over-the-counter and prescription medicines only as told by your health care provider.  If you were prescribed an antibiotic, take it as told by your health care provider. Do not stop taking the antibiotic even if you start to feel better.  Avoid alcohol, caffeine, tea, and carbonated beverages. They can irritate your bladder.  Drink enough fluid to keep your urine clear or pale yellow.  Keep all follow-up visits as told by your health care provider. This is important.  Make sure to: ? Empty your bladder often and completely. Do not hold urine for long periods of time. ? Empty your bladder before and after sex. ? Wipe from front to back after a bowel movement if you are female. Use each tissue one time when you wipe. Contact a health care provider if:  You have back pain.  You have a fever.  You feel nauseous or   vomit.  Your symptoms do not get better after 3 days.  Your symptoms go away and then return. Get help right away if:  You have severe back pain or lower abdominal pain.  You are vomiting and cannot keep down any medicines or water. This information is not intended to replace advice given to you by your health care provider. Make sure you discuss any questions you have with your health care  provider. Document Released: 02/26/2005 Document Revised: 10/31/2015 Document Reviewed: 04/09/2015 Elsevier Interactive Patient Education  2017 [employer]  

## 2016-09-01 NOTE — Progress Notes (Signed)
   HPI  Patient presents today with concern for UTI, also with frequent flatus and bipolar disorder  Bipolar Patient having difficulty sleeping recently, she clarifies her changes in sleeping medications. She is concerned that I thought she was trying to deceive me about medications last visit. Reassurance provided She has very good psychiatric care and has had many changes lately.  UTI symptoms Patient has had 2 nights of nighttime urinary incontinence, no daytime incontinence Fever, chills, sweats. -positive dysuria.  Increased flatus, not improving with simethicone or Beano. No change with diet that's clear, patient has symptoms on a daily basis, 3 days a week she has milk which seems to make it worse.  PMH: Smoking status noted ROS: Per HPI  Objective: BP 119/77   Pulse 86   Temp (!) 95.2 F (35.1 C) (Oral)   Ht 5\' 2"  (1.575 m)   Wt 194 lb 9.6 oz (88.3 kg)   BMI 35.59 kg/m  Gen: NAD, alert, cooperative with exam HEENT: NCAT CV: RRR, good S1/S2, no murmur Resp: CTABL, no wheezes, non-labored Abd: No suprapubic tenderness, no CVA tenderness Ext: No edema, warm Neuro: Alert and oriented, No gross deficits  Assessment and plan:  # UTI Treat with doxycycline based on previous micro- Urine culture No signs of sepsis  # flatulence Unclear etiology, it is possible that the course of antibiotics will change her bowel flora and improve this.  # Bipolar disorder Patient has close psychiatric follow-up, stable at this time Reassurance provided that I was not concerned that she was seeking drugs. I did explain however that since she was being prescribed controlled substances at another clinic I would not prescribe like medications to protect her from any scrutiny it at her psychiatrist's office   Orders Placed This Encounter  Procedures  . Urine culture  . Microscopic Examination  . Urinalysis, Complete    Meds ordered this encounter  Medications  . doxycycline  (VIBRA-TABS) 100 MG tablet    Sig: Take 1 tablet (100 mg total) by mouth 2 (two) times daily. 1 po bid    Dispense:  20 tablet    Refill:  0    Laroy Apple, MD [facility] 09/01/2016, 5:17 PM

## 2016-09-02 ENCOUNTER — Other Ambulatory Visit (HOSPITAL_COMMUNITY): Payer: Self-pay

## 2016-09-02 DIAGNOSIS — H25011 Cortical age-related cataract, right eye: Secondary | ICD-10-CM | POA: Diagnosis not present

## 2016-09-02 DIAGNOSIS — H2511 Age-related nuclear cataract, right eye: Secondary | ICD-10-CM | POA: Diagnosis not present

## 2016-09-02 DIAGNOSIS — H25811 Combined forms of age-related cataract, right eye: Secondary | ICD-10-CM | POA: Diagnosis not present

## 2016-09-02 MED ORDER — ARIPIPRAZOLE 10 MG PO TABS: ORAL_TABLET | ORAL | 2 refills | Status: DC

## 2016-09-03 LAB — URINE CULTURE

## 2016-09-04 ENCOUNTER — Telehealth: Payer: Self-pay | Admitting: Family Medicine

## 2016-09-04 ENCOUNTER — Ambulatory Visit (INDEPENDENT_AMBULATORY_CARE_PROVIDER_SITE_OTHER): Payer: Medicare Other | Admitting: Psychiatry

## 2016-09-04 VITALS — BP 130/78 | HR 76 | Ht 62.0 in | Wt 194.0 lb

## 2016-09-04 DIAGNOSIS — E118 Type 2 diabetes mellitus with unspecified complications: Secondary | ICD-10-CM | POA: Diagnosis not present

## 2016-09-04 DIAGNOSIS — F3160 Bipolar disorder, current episode mixed, unspecified: Secondary | ICD-10-CM | POA: Diagnosis not present

## 2016-09-04 DIAGNOSIS — Z79899 Other long term (current) drug therapy: Secondary | ICD-10-CM | POA: Diagnosis not present

## 2016-09-04 DIAGNOSIS — Z818 Family history of other mental and behavioral disorders: Secondary | ICD-10-CM | POA: Diagnosis not present

## 2016-09-04 DIAGNOSIS — Z794 Long term (current) use of insulin: Secondary | ICD-10-CM

## 2016-09-04 MED ORDER — CARBAMAZEPINE ER 100 MG PO TB12: ORAL_TABLET | ORAL | 3 refills | Status: DC

## 2016-09-04 NOTE — Progress Notes (Signed)
Patient ID: Amber Stephenson, female   DOB: 1945/11/09, 71 y.o.   MRN: [id number] [facility] MD Progress Note  09/04/2016 2:08 PM Amber Stephenson  MRN:  [id number] Subjective:  Doing well Principal Problem: Bipolar disorder most recent episode manic Diagnosis:  Bipolar disorder most recent  At this time the patient is better. It turns out that she had a bladder infection that she still has one. She now is being treated with antibiotics. She also had a complicated eye surgery which is very difficult for her. That is done she seemed fine and she is receiving antibiotics for her bladder. She is less tense. She is less hyperactive. She no longer has racing thinking. She is now sleeping better. She is inconsistent whether or not the Ativan is been helpful. Overall was given to calm her and help her sleep which I think it's done. Nonetheless the patient continues to complain her mood is unstable. She says is up and down and I just happened to be catching her on a good day. In fact she says today is the first day she's physically felt much better. The patient is not psychotic. She drinks no alcohol. Today her husband did not come back with her. Overall she is better but she still feels symptomatic in terms of mood instability. Past Medical History:  Past Medical History:  Diagnosis Date  . Atrial fibrillation ([place])   . Bipolar affective ([place])   . CHF (congestive heart failure) ([place])   . Chronic back pain   . Diabetes mellitus without complication ([place])   . Hyperlipidemia   . Hypertension   . Pain management   . Renal insufficiency     Past Surgical History:  Procedure Laterality Date  . BREAST REDUCTION SURGERY    . SHOULDER SURGERY Right   . THIGH SURGERY     Family History:  Family History  Problem Relation Age of Onset  . Diabetes Mother   . Heart disease Mother   . Heart disease Brother   . Heart disease Sister     CABG  . Mental illness Brother   . Diabetes Brother    Family Psychiatric   History:  Social History:  History  Alcohol Use No     History  Drug Use No    Social History   Social History  . Marital status: Married    Spouse name: N/A  . Number of children: 3  . Years of education: N/A   Social History Main Topics  . Smoking status: Never Smoker  . Smokeless tobacco: Never Used  . Alcohol use No  . Drug use: No  . Sexual activity: Yes    Partners: Male    Birth control/ protection: Post-menopausal   Other Topics Concern  . Not on file   Social History Narrative   Lives at home with husband.    Additional Social History:                         Sleep: Good  Appetite:  Good  Current Medications: Current Outpatient Prescriptions  Medication Sig Dispense Refill  . apixaban (ELIQUIS) 5 MG TABS tablet Take 5 mg by mouth 2 (two) times daily.    . ARIPiprazole (ABILIFY) 10 MG tablet 3 qam 270 tablet 2  . atorvastatin (LIPITOR) 40 MG tablet TAKE 1 TABLET DAILY 90 tablet 0  . Blood Glucose Monitoring Suppl (FREESTYLE LITE) DEVI Use to check blood sugar tid. DX E11.22 1 each 0  .  Cholecalciferol (VITAMIN D3) 1000 UNITS CAPS Take 1,000 Units by mouth daily.    Marland Kitchen doxycycline (VIBRA-TABS) 100 MG tablet Take 1 tablet (100 mg total) by mouth 2 (two) times daily. 1 po bid 20 tablet 0  . Exenatide ER (BYDUREON) 2 MG PEN Inject the contents of one pen once per week 12 each 1  . furosemide (LASIX) 20 MG tablet Take 1 tablet (20 mg total) by mouth 2 (two) times daily. 180 tablet 1  . glucose blood (FREESTYLE LITE) test strip Test tid. DX E11.22 100 each 5  . Insulin Pen Needle (SURE COMFORT PEN NEEDLES) 32G X 4 MM MISC Use with insulin pens as directed. Dx E11.9 300 each 11  . Lancets (FREESTYLE) lancets Test tid. DX E11.22 100 each 5  . LANTUS SOLOSTAR 100 UNIT/ML Solostar Pen INJECT 38 UNITS UNDER THE SKIN TWICE A DAY 90 mL 2  . levalbuterol (XOPENEX) 1.25 MG/3ML nebulizer solution Take 1.25 mg by nebulization every 4 (four) hours as needed for  wheezing. 72 mL 0  . lidocaine (LIDODERM) 5 % Place 1 patch onto the skin daily. 12 hours on 12 hours off 90 patch 5  . LORazepam (ATIVAN) 1 MG tablet 1  Bid   1 prn 90 tablet 2  . LYRICA 100 MG capsule Take 100 mg by mouth 2 (two) times daily.    . metoprolol (LOPRESSOR) 100 MG tablet Take 1 tablet (100 mg total) by mouth 2 (two) times daily. 180 tablet 3  . rizatriptan (MAXALT-MLT) 10 MG disintegrating tablet DISSOLVE 1 TABLET UNDER TONGUE IMMEDIATELY, MAY REPEAT DOSE IN 1 HOUR IF NEEDED- MAX OF 3 TABLETS IN 24 HOURS 10 tablet 0  . SUMAtriptan (IMITREX) 100 MG tablet Take one at onset of HA. May repeat in 2 hours if headache persists or recurs. Limit two per 24 hours 10 tablet 11  . temazepam (RESTORIL) 15 MG capsule 2  qhs 60 capsule 4  . tizanidine (ZANAFLEX) 6 MG capsule TAKE 1 CAPSULE THREE TIMES A DAY AS NEEDED FOR MUSCLE SPASMS 42 capsule 3  . triamcinolone ointment (KENALOG) 0.5 % Apply 1 application topically 2 (two) times daily. 30 g 0  . TRICOR 145 MG tablet TAKE 1 TABLET DAILY 90 tablet 0  . zaleplon (SONATA) 10 MG capsule 2  qhs 60 capsule 3  . carbamazepine (TEGRETOL-XR) 100 MG 12 hr tablet 1  bid 60 tablet 3   No current facility-administered medications for this visit.     Lab Results: No results found for this or any previous visit (from the past 48 hour(s)).  Blood Alcohol level:  Lab Results  Component Value Date   ETH <5 10/01/2015    Physical Findings: AIMS:  , ,  ,  ,    CIWA:    COWS:     Musculoskeletal: Strength & Muscle Tone: within normal limits Gait & Station: normal Patient leans: N/A  Psychiatric Specialty Exam: ROS  Blood pressure 130/78, pulse 76, height 5\' 2"  (1.575 m), weight 194 lb (88 kg).Body mass index is 35.48 kg/m.  General Appearance: Casual  Eye Contact::  Good  Speech:  Clear and Coherent  Volume:  Normal  Mood:  Euthymic  Affect:  Appropriate  Thought Process:  Coherent  Orientation:  Full (Time, Place, and Person)  Thought  Content:  WDL  Suicidal Thoughts:  No  Homicidal Thoughts:  No  Memory:  NA  Judgement:  Good  Insight:  Good  Psychomotor Activity:  Normal  Concentration:  Good  Recall:  Good  Fund of Knowledge:Good  Language: Good  Akathisia:  No  Handed:  Right  AIMS (if indicated):     Assets: Good spirits   ADL's:  Intact  Cognition: WNL  Sleep:      Treatment Plan Summary:  The patient attempted to take 30 mg of Abilify and she said she felt sick. She went back to taking 20 mg. No one is clear she felt sick because she had a bladder infection was having eye surgery. She really had no GI symptoms on 20 mg of Abilify and I doubt the 30 mg caused the problem. Pronounced years off his return to 20 and we'll leave it there. She like to increase her Ativan but I'm not going to do this at this time. We'll continue 1 mg twice a day. Today we'll do is we'll add Tegretol 100 mg X are twice a day effort to control her rapid shifting mood state. She'll return to see me in 4 weeks. At that time we'll obtain a Tegretol blood level. When we see her in 4 weeks we'll be sure to see her with her husband. Overall I do not think she is manic at that she's closer back to her baseline. If necessary will increase the Abilify or at least try to get at a later point if necessary. The patient certainly is not suicidal.

## 2016-09-04 NOTE — Telephone Encounter (Signed)
Pt aware of results 

## 2016-09-05 ENCOUNTER — Ambulatory Visit: Payer: Self-pay | Admitting: Endocrinology

## 2016-09-08 ENCOUNTER — Other Ambulatory Visit: Payer: Self-pay

## 2016-09-10 DIAGNOSIS — M47816 Spondylosis without myelopathy or radiculopathy, lumbar region: Secondary | ICD-10-CM | POA: Diagnosis not present

## 2016-09-10 DIAGNOSIS — G8929 Other chronic pain: Secondary | ICD-10-CM | POA: Diagnosis not present

## 2016-09-15 ENCOUNTER — Telehealth: Payer: Self-pay | Admitting: Family Medicine

## 2016-09-15 MED ORDER — MAGIC MOUTHWASH: 5.0000 mL | Freq: Four times a day (QID) | ORAL | 0 refills | Status: DC

## 2016-09-15 NOTE — Telephone Encounter (Signed)
What is the name of the medication? nysta/hc/banossen magic  Have you contacted your pharmacy to request a refill? no  Which pharmacy would you like this sent to? [facility] in [place].   Patient notified that their request is being sent to the clinical staff for review and that they should receive a call once it is complete. If they do not receive a call within 24 hours they can check with their pharmacy or our office.

## 2016-09-15 NOTE — Telephone Encounter (Signed)
Patient states she is having mouth sores and in the past magic mouthwash has helped her. She would like a rx sent to [facility] in [place] for nysta/hc/banoffen magic mouthwash. Patient states she last had it filled 8/17. Covering PCP, please advise and send back to the pools.

## 2016-09-16 ENCOUNTER — Encounter: Payer: Self-pay | Admitting: Physical Therapy

## 2016-09-16 ENCOUNTER — Ambulatory Visit: Payer: Medicare Other | Admitting: Family Medicine

## 2016-09-16 NOTE — Telephone Encounter (Signed)
Patient aware magic mouth wash has been sent to pharmacy

## 2016-09-18 ENCOUNTER — Encounter: Payer: Self-pay | Admitting: Family Medicine

## 2016-09-18 ENCOUNTER — Ambulatory Visit (INDEPENDENT_AMBULATORY_CARE_PROVIDER_SITE_OTHER): Payer: Medicare Other | Admitting: Family Medicine

## 2016-09-18 ENCOUNTER — Other Ambulatory Visit: Payer: Self-pay | Admitting: Family Medicine

## 2016-09-18 VITALS — BP 164/112 | HR 85 | Temp 97.3°F | Ht 62.0 in | Wt 193.4 lb

## 2016-09-18 DIAGNOSIS — E1122 Type 2 diabetes mellitus with diabetic chronic kidney disease: Secondary | ICD-10-CM

## 2016-09-18 DIAGNOSIS — N182 Chronic kidney disease, stage 2 (mild): Secondary | ICD-10-CM

## 2016-09-18 DIAGNOSIS — R5383 Other fatigue: Secondary | ICD-10-CM

## 2016-09-18 DIAGNOSIS — R682 Dry mouth, unspecified: Secondary | ICD-10-CM | POA: Diagnosis not present

## 2016-09-18 DIAGNOSIS — K1120 Sialoadenitis, unspecified: Secondary | ICD-10-CM | POA: Diagnosis not present

## 2016-09-18 LAB — BAYER DCA HB A1C WAIVED: HB A1C (BAYER DCA - WAIVED): 7.1 % — ABNORMAL HIGH (ref <7.0)

## 2016-09-18 MED ORDER — RIZATRIPTAN BENZOATE 10 MG PO TBDP: ORAL_TABLET | ORAL | 5 refills | Status: DC

## 2016-09-18 NOTE — Patient Instructions (Signed)
Great to see you!  Try warm compresses on your jaws 15-20 minutes 3-4 times daily. If it seems to make it worse change to cold compresses  We will call with lab work results within 1 week

## 2016-09-18 NOTE — Progress Notes (Signed)
   HPI  Patient presents today here with fatigue, and all pain, and dry mouth.  Patient explains that she's had extreme fatigue over the last 2 weeks. Notably she started Tegretol for bipolar disorder about 2 weeks ago. Patient describes extreme sleepiness, decreased energy She's also concerned because of her kidney disease that it may be related to that.  Diabetes Average fasting blood sugar in the low 100s, occasionally as high as 150, no hypoglycemia Taking 42 units of Lantus twice daily. Also taking Bydureon.  Dry mouth, jaw pain Patient has dry mouth and decreased sweating for at least a year. Chest 2 day history of acute jaw pain with tenderness to palpation of the parotid glands and under the mandible. Denies any injury or procedures other than a cataract surgery. No fever, chills, sweats. No cough, shortness of breath   PMH: Smoking status noted ROS: Per HPI  Objective: BP (!) 164/112   Pulse 85   Temp 97.3 F (36.3 C) (Oral)   Ht 5' 2" (1.575 m)   Wt 193 lb 6.4 oz (87.7 kg)   BMI 35.37 kg/m  Gen: NAD, alert, cooperative with exam HEENT: NCAT, oropharynx clear, no salivary stones apparent, oropharynx moist Neck: Tenderness to palpation under the mandible bilaterally, over the bilateral parotid glands CV: RRR, good S1/S2 Resp: CTABL, no wheezes, non-labored Ext: No edema, warm Neuro: Alert and oriented, No gross deficits  Assessment and plan:  # Type 2 diabetes A1c due, pending. Continue Lantus Labs  # Parotitis Most likely Dx With dry mouth I cosidiered sjogrens- labs ordered, 1 + year of decreased sweating and dry mouth  # Fatigue With CKD I think labs are appropriate, no signs of overt renal failure [possibly medication related to tegretol.    Orders Placed This Encounter  Procedures  . Sjogren's syndrome antibods(ssa + ssb)  . Sedimentation rate  . CMP14+EGFR  . Bayer DCA Hb A1c Waived    Meds ordered this encounter  Medications  .  rizatriptan (MAXALT-MLT) 10 MG disintegrating tablet    Sig: DISSOLVE 1 TABLET UNDER TONGUE IMMEDIATELY, MAY REPEAT DOSE IN 1 HOUR IF NEEDED- MAX OF 3 TABLETS IN 24 HOURS    Dispense:  10 tablet    Refill:  5    Sam Bradshaw, MD [facility] 09/18/2016, 1:20 PM     

## 2016-09-19 ENCOUNTER — Ambulatory Visit: Payer: Medicare Other | Attending: Orthopedic Surgery | Admitting: Physical Therapy

## 2016-09-19 DIAGNOSIS — M25511 Pain in right shoulder: Secondary | ICD-10-CM | POA: Diagnosis not present

## 2016-09-19 DIAGNOSIS — M25611 Stiffness of right shoulder, not elsewhere classified: Secondary | ICD-10-CM | POA: Insufficient documentation

## 2016-09-19 LAB — CMP14+EGFR
ALT: 9 IU/L (ref 0–32)
AST: 20 IU/L (ref 0–40)
Albumin/Globulin Ratio: 1.4 (ref 1.2–2.2)
Albumin: 3.9 g/dL (ref 3.6–4.8)
Alkaline Phosphatase: 172 IU/L — ABNORMAL HIGH (ref 39–117)
BUN/Creatinine Ratio: 17 (ref 12–28)
BUN: 22 mg/dL (ref 8–27)
Bilirubin Total: 0.3 mg/dL (ref 0.0–1.2)
CO2: 24 mmol/L (ref 18–29)
Calcium: 9.4 mg/dL (ref 8.7–10.3)
Chloride: 100 mmol/L (ref 96–106)
Creatinine, Ser: 1.26 mg/dL — ABNORMAL HIGH (ref 0.57–1.00)
GFR calc Af Amer: 50 mL/min/{1.73_m2} — ABNORMAL LOW (ref >59)
GFR calc non Af Amer: 44 mL/min/{1.73_m2} — ABNORMAL LOW (ref >59)
Globulin, Total: 2.8 g/dL (ref 1.5–4.5)
Glucose: 282 mg/dL — ABNORMAL HIGH (ref 65–99)
Potassium: 4.5 mmol/L (ref 3.5–5.2)
Sodium: 142 mmol/L (ref 134–144)
Total Protein: 6.7 g/dL (ref 6.0–8.5)

## 2016-09-19 LAB — SEDIMENTATION RATE: Sed Rate: 10 mm/hr (ref 0–40)

## 2016-09-19 LAB — SJOGREN'S SYNDROME ANTIBODS(SSA + SSB)
ENA SSA (RO) Ab: <0.2 AI (ref 0.0–0.9)
ENA SSB (LA) Ab: <0.2 AI (ref 0.0–0.9)

## 2016-09-19 NOTE — Therapy (Signed)
[facility] [place], [place], [address] Phone: [phone]   Fax:  [phone]  Physical Therapy Treatment  Patient Details  Name: Amber Stephenson MRN: [id number] Date of Birth: 10-20-1945 Referring Provider: Netta Cedars MD.  Encounter Date: 09/19/2016      PT End of Session - 09/19/16 1239    Visit Number 17   Number of Visits 27   Date for PT Re-Evaluation 09/30/16   PT Start Time 1115   PT Stop Time 1206   PT Time Calculation (min) 51 min   Activity Tolerance Patient tolerated treatment well   Behavior During Therapy [facility] for tasks assessed/performed      Past Medical History:  Diagnosis Date  . Atrial fibrillation ([place])   . Bipolar affective ([place])   . CHF (congestive heart failure) ([place])   . Chronic back pain   . Diabetes mellitus without complication ([place])   . Hyperlipidemia   . Hypertension   . Pain management   . Renal insufficiency     Past Surgical History:  Procedure Laterality Date  . BREAST REDUCTION SURGERY    . EYE SURGERY Right    cateracts  . SHOULDER SURGERY Right   . THIGH SURGERY      There were no vitals filed for this visit.      Subjective Assessment - 09/19/16 1229    Subjective The doctor was pleased with how I was doing.   Pain Score 3    Pain Location Shoulder   Pain Orientation Right   Pain Descriptors / Indicators Aching   Pain Type Surgical pain                         [facility] Adult PT Treatment/Exercise - 09/19/16 0001      Shoulder Exercises: Pulleys   Flexion Limitations 5 minutes.     Modalities   Modalities Electrical Stimulation;Moist Heat     Moist Heat Therapy   Number Minutes Moist Heat 20 Minutes   Moist Heat Location --  Right shoulder.     Acupuncturist Location Right shoulder.   Electrical Stimulation Action Pre-mod.   Electrical Stimulation Parameters 80-150 Hz x 20 minutes.   Electrical  Stimulation Goals Pain     Manual Therapy   Passive ROM Seated active-assisitve right shoulder range of motion into flexion and ER x 10 minutes and then continued in supine x 8 minutes.                  PT Short Term Goals - 06/12/16 1417      PT SHORT TERM GOAL #1   Title STG's=LTG's.           PT Long Term Goals - 08/19/16 1602      PT LONG TERM GOAL #1   Title Independent with an HEP.   Time 8   Period Weeks   Status On-going     PT LONG TERM GOAL #2   Title Active right shoulder flexion to 150 degrees so the patient can easily reach overhead.   Time 8   Period Weeks   Status On-going     PT LONG TERM GOAL #3   Title Active ER to 70 degrees+ to allow for easily donning/doffing of apparel   Time 8   Period Weeks   Status On-going     PT LONG TERM GOAL #4   Title Increase right shoulder strength to  a solid 4+/5 to increase stability for performance of functional activities   Time 8   Period Weeks   Status On-going     PT LONG TERM GOAL #5   Title Perform ADL's with pain not > 3/10.   Time 8   Period Weeks   Status On-going               Plan - 09/19/16 1240    Clinical Impression Statement Patient did well today but lost some range of motion due to being out of therapy since 08/28/16.      Patient will benefit from skilled therapeutic intervention in order to improve the following deficits and impairments:     Visit Diagnosis: Acute pain of right shoulder  Stiffness of right shoulder, not elsewhere classified     Problem List Patient Active Problem List   Diagnosis Date Noted  . Healthcare maintenance 02/12/2016  . Migraine headache with aura 02/12/2016  . Diabetic neuropathy ([place]) 02/06/2016  . Depression   . Encounter for preadmission testing   . Herpes genitalis in women 07/16/2015  . Genital lesion, female 07/12/2015  . Chronic anticoagulation 05/30/2015  . Family history of coronary artery disease in sister 05/30/2015   . Acute on chronic diastolic CHF (congestive heart failure), NYHA class 1 ([place]) 05/30/2015  . Chest pain with moderate risk of acute coronary syndrome 05/29/2015  . Pain in the chest   . Dyspnea   . Essential hypertension   . RLS (restless legs syndrome) 04/27/2015  . Fall 02/26/2015  . Back pain 02/26/2015  . Lipoma 02/08/2015  . Bipolar disorder ([place]) 01/23/2015  . Insomnia 01/23/2015  . CKD stage 3 due to type 2 diabetes mellitus ([place]) 01/05/2015  . Chronic back pain 01/04/2015  . Permanent atrial fibrillation ([place]) 01/04/2015  . Pruritus 01/04/2015  . Diabetes mellitus with stage 2 chronic kidney disease ([place])   . Hyperlipidemia     Cassadie Pankonin, [place] MPT 09/19/2016, 12:47 PM  [facility] [address] [place], [place], [address] Phone: [phone]   Fax:  [phone]  Name: Amber Stephenson MRN: [id number] Date of Birth: May 01, 1946

## 2016-09-20 ENCOUNTER — Ambulatory Visit
Admission: RE | Admit: 2016-09-20 | Discharge: 2016-09-20 | Disposition: A | Discharge status: Home / Self Care | Payer: Medicare Other | Source: Ambulatory Visit | Attending: General Surgery | Admitting: General Surgery

## 2016-09-20 DIAGNOSIS — R937 Abnormal findings on diagnostic imaging of other parts of musculoskeletal system: Secondary | ICD-10-CM | POA: Diagnosis not present

## 2016-09-20 DIAGNOSIS — D1724 Benign lipomatous neoplasm of skin and subcutaneous tissue of left leg: Secondary | ICD-10-CM

## 2016-09-20 MED ORDER — GADOBENATE DIMEGLUMINE 529 MG/ML IV SOLN
10.0000 mL | Freq: Once | INTRAVENOUS | Status: AC | PRN
  Administered 2016-09-20: 8 mL via INTRAVENOUS

## 2016-09-23 DIAGNOSIS — Z4789 Encounter for other orthopedic aftercare: Secondary | ICD-10-CM | POA: Diagnosis not present

## 2016-09-23 DIAGNOSIS — T8484XD Pain due to internal orthopedic prosthetic devices, implants and grafts, subsequent encounter: Secondary | ICD-10-CM | POA: Diagnosis not present

## 2016-09-24 ENCOUNTER — Encounter (HOSPITAL_COMMUNITY): Payer: Self-pay | Admitting: Psychiatry

## 2016-09-24 ENCOUNTER — Ambulatory Visit (INDEPENDENT_AMBULATORY_CARE_PROVIDER_SITE_OTHER): Payer: Medicare Other | Admitting: Psychiatry

## 2016-09-24 VITALS — BP 140/80 | HR 83 | Ht 62.0 in | Wt 195.2 lb

## 2016-09-24 DIAGNOSIS — Z79899 Other long term (current) drug therapy: Secondary | ICD-10-CM

## 2016-09-24 DIAGNOSIS — Z818 Family history of other mental and behavioral disorders: Secondary | ICD-10-CM

## 2016-09-24 DIAGNOSIS — F3162 Bipolar disorder, current episode mixed, moderate: Secondary | ICD-10-CM | POA: Diagnosis not present

## 2016-09-24 DIAGNOSIS — F3160 Bipolar disorder, current episode mixed, unspecified: Secondary | ICD-10-CM

## 2016-09-24 MED ORDER — LORAZEPAM 1 MG PO TABS: ORAL_TABLET | ORAL | 3 refills | Status: DC

## 2016-09-24 NOTE — Progress Notes (Signed)
Patient ID: Amber Stephenson, female   DOB: 1946/02/10, 71 y.o.   MRN: [id number] [facility] MD Progress Note  09/24/2016 2:22 PM Chantell Kunkler  MRN:  [id number] Subjective:  Doing well Principal Problem: Bipolar disorder most recent episode manic Diagnosis:  Bipolar disorder most recent  Today the patient is seen on time. The patient is seen with her husband this time. Patient is doing much better. Her mood is much more even. She is less irritable and tense. She's not euphoric. Her husband says she's back to her baseline over the last few weeks. Her last visit we started her on Tegretol 100 mg twice a day. The patient  is taking it on a regular basis. The patient is done well taking less Abilify taking 20 mg. She denies any GI symptoms at this time. The patient continues taking Ativan 1 mg twice a day. Patient is much calmer. Her speech is normal. She is not hyper. Her self-esteem seems normal. She sleeping and eating well. She drinks no alcohol at all. She feels back to herself. She is able to think and concentrate and function well. She certainly is not suicidal. She is not psychotic. I think Tegretol was helping her because I think she has a mixed bipolar disorder. The patient shows no evidence of tardive dyskinesia. Past Medical History:  Past Medical History:  Diagnosis Date  . Atrial fibrillation ([place])   . Bipolar affective ([place])   . CHF (congestive heart failure) ([place])   . Chronic back pain   . Diabetes mellitus without complication ([place])   . Hyperlipidemia   . Hypertension   . Pain management   . Renal insufficiency     Past Surgical History:  Procedure Laterality Date  . BREAST REDUCTION SURGERY    . EYE SURGERY Right    cateracts  . SHOULDER SURGERY Right   . THIGH SURGERY     Family History:  Family History  Problem Relation Age of Onset  . Diabetes Mother   . Heart disease Mother   . Heart disease Brother   . Heart disease Sister     CABG  . Mental illness Brother   .  Diabetes Brother    Family Psychiatric  History:  Social History:  History  Alcohol Use No     History  Drug Use No    Social History   Social History  . Marital status: Married    Spouse name: N/A  . Number of children: 3  . Years of education: N/A   Social History Main Topics  . Smoking status: Never Smoker  . Smokeless tobacco: Never Used  . Alcohol use No  . Drug use: No  . Sexual activity: Yes    Partners: Male    Birth control/ protection: Post-menopausal   Other Topics Concern  . None   Social History Narrative   Lives at home with husband.    Additional Social History:                         Sleep: Good  Appetite:  Good  Current Medications: Current Outpatient Prescriptions  Medication Sig Dispense Refill  . apixaban (ELIQUIS) 5 MG TABS tablet Take 5 mg by mouth 2 (two) times daily.    . ARIPiprazole (ABILIFY) 10 MG tablet 3 qam 270 tablet 2  . atorvastatin (LIPITOR) 40 MG tablet TAKE 1 TABLET DAILY 90 tablet 0  . Blood Glucose Monitoring Suppl (FREESTYLE LITE) DEVI Use to check  blood sugar tid. DX E11.22 1 each 0  . carbamazepine (TEGRETOL-XR) 100 MG 12 hr tablet 1  bid 60 tablet 3  . Cholecalciferol (VITAMIN D3) 1000 UNITS CAPS Take 1,000 Units by mouth daily.    . Exenatide ER (BYDUREON) 2 MG PEN Inject the contents of one pen once per week 12 each 1  . furosemide (LASIX) 20 MG tablet Take 1 tablet (20 mg total) by mouth 2 (two) times daily. 180 tablet 1  . glucose blood (FREESTYLE LITE) test strip Test tid. DX E11.22 100 each 5  . Insulin Pen Needle (SURE COMFORT PEN NEEDLES) 32G X 4 MM MISC Use with insulin pens as directed. Dx E11.9 300 each 11  . Lancets (FREESTYLE) lancets Test tid. DX E11.22 100 each 5  . LANTUS SOLOSTAR 100 UNIT/ML Solostar Pen INJECT 38 UNITS UNDER THE SKIN TWICE A DAY 90 mL 2  . levalbuterol (XOPENEX) 1.25 MG/3ML nebulizer solution Take 1.25 mg by nebulization every 4 (four) hours as needed for wheezing. 72 mL 0   . lidocaine (LIDODERM) 5 % Place 1 patch onto the skin daily. 12 hours on 12 hours off 90 patch 5  . LORazepam (ATIVAN) 1 MG tablet 1  Bid   1 prn 65 tablet 3  . LYRICA 100 MG capsule Take 100 mg by mouth 2 (two) times daily.    . magic mouthwash SOLN Take 5 mLs by mouth 4 (four) times daily. 240 mL 0  . metoprolol (LOPRESSOR) 100 MG tablet Take 1 tablet (100 mg total) by mouth 2 (two) times daily. 180 tablet 3  . rizatriptan (MAXALT-MLT) 10 MG disintegrating tablet DISSOLVE 1 TABLET UNDER TONGUE IMMEDIATELY, MAY REPEAT DOSE IN 1 HOUR IF NEEDED- MAX OF 3 TABLETS IN 24 HOURS 10 tablet 5  . SUMAtriptan (IMITREX) 100 MG tablet Take one at onset of HA. May repeat in 2 hours if headache persists or recurs. Limit two per 24 hours 10 tablet 11  . temazepam (RESTORIL) 15 MG capsule 2  qhs 60 capsule 4  . tizanidine (ZANAFLEX) 6 MG capsule TAKE 1 CAPSULE THREE TIMES A DAY AS NEEDED FOR MUSCLE SPASMS 42 capsule 3  . triamcinolone ointment (KENALOG) 0.5 % Apply 1 application topically 2 (two) times daily. 30 g 0  . TRICOR 145 MG tablet TAKE 1 TABLET DAILY 90 tablet 0   No current facility-administered medications for this visit.     Lab Results: No results found for this or any previous visit (from the past 48 hour(s)).  Blood Alcohol level:  Lab Results  Component Value Date   ETH <5 10/01/2015    Physical Findings: AIMS:  , ,  ,  ,    CIWA:    COWS:     Musculoskeletal: Strength & Muscle Tone: within normal limits Gait & Station: normal Patient leans: N/A  Psychiatric Specialty Exam: ROS  Blood pressure 140/80, pulse 83, height 5\' 2"  (1.575 m), weight 195 lb 3.2 oz (88.5 kg).Body mass index is 35.7 kg/m.  General Appearance: Casual  Eye Contact::  Good  Speech:  Clear and Coherent  Volume:  Normal  Mood:  Euthymic  Affect:  Appropriate  Thought Process:  Coherent  Orientation:  Full (Time, Place, and Person)  Thought Content:  WDL  Suicidal Thoughts:  No  Homicidal Thoughts:   No  Memory:  NA  Judgement:  Good  Insight:  Good  Psychomotor Activity:  Normal  Concentration:  Good  Recall:  Good  Fund of Knowledge:Good  Language: Good  Akathisia:  No  Handed:  Right  AIMS (if indicated):     Assets: Good spirits   ADL's:  Intact  Cognition: WNL  Sleep:      Treatment Plan Summary:  At this time the patient is doing well. She is back to her baseline. Her #1 problem of course is bipolar disorder mixed type. At this time we'll go ahead and get a Tegretol blood level and comprehensive metabolic panel. She's taking 100 mg twice a day doing well. She takes Abilify 20 mg and Ativan 1 mg twice a day. Patient says the Ativan works well but probably not as good a Xanax. At this time we'll stick with Ativan 1 mg twice a day. The patient is not oversedated. His her mood is good. She is very stable. She'll return to see me in 10 weeks.

## 2016-10-01 ENCOUNTER — Encounter: Payer: Self-pay | Admitting: Physical Therapy

## 2016-10-01 ENCOUNTER — Ambulatory Visit: Payer: Medicare Other | Attending: Orthopedic Surgery | Admitting: Physical Therapy

## 2016-10-01 DIAGNOSIS — M25611 Stiffness of right shoulder, not elsewhere classified: Secondary | ICD-10-CM | POA: Insufficient documentation

## 2016-10-01 DIAGNOSIS — M25511 Pain in right shoulder: Secondary | ICD-10-CM | POA: Diagnosis not present

## 2016-10-01 NOTE — Patient Instructions (Signed)
  Strengthening: Resisted Flexion   Hold tubing with right arm at side. Pull forward and up. Move shoulder through pain-free range of motion. Repeat __10__ times per set. Do _2-3___ sets per session. Do _2-3___ sessions per day.  Strengthening: Resisted Extension   Hold tubing in right hand, arm forward. Pull arm back, elbow straight. Repeat __10__ times per set. Do _2-3___ sets per session. Do _2-3___ sessions per day.   Strengthening: Resisted Internal Rotation   Hold tubing in right hand, elbow at side and forearm out. Rotate forearm in across body. Repeat __10__ times per set. Do _2-3___ sets per session. Do _2-3___ sessions per day.   Strengthening: Resisted External Rotation   Hold tubing in right hand, elbow at side and forearm across body. Rotate forearm out. Repeat __10__ times per set. Do __2-3__ sets per session. Do ____ sessions per day.   

## 2016-10-01 NOTE — Therapy (Signed)
[facility] [place], [place], [address] Phone: [phone]   Fax:  [phone]  Physical Therapy Treatment  Patient Details  Name: Amber Stephenson MRN: [id number] Date of Birth: February 28, 1946 Referring Provider: Netta Cedars MD.  Encounter Date: 10/01/2016      PT End of Session - 10/01/16 1436    Visit Number 18   Number of Visits 27   Date for PT Re-Evaluation 10/21/16   PT Start Time 1401   PT Stop Time 1446   PT Time Calculation (min) 45 min   Activity Tolerance Patient tolerated treatment well   Behavior During Therapy [facility] for tasks assessed/performed      Past Medical History:  Diagnosis Date  . Atrial fibrillation ([place])   . Bipolar affective ([place])   . CHF (congestive heart failure) ([place])   . Chronic back pain   . Diabetes mellitus without complication ([place])   . Hyperlipidemia   . Hypertension   . Pain management   . Renal insufficiency     Past Surgical History:  Procedure Laterality Date  . BREAST REDUCTION SURGERY    . EYE SURGERY Right    cateracts  . SHOULDER SURGERY Right   . THIGH SURGERY      There were no vitals filed for this visit.      Subjective Assessment - 10/01/16 1408    Subjective Patient has noew order to cont 1x week for 4 weks   Currently in Pain? Yes   Pain Score 3    Pain Location Shoulder   Pain Orientation Right   Pain Descriptors / Indicators Aching   Pain Type Surgical pain   Pain Onset More than a month ago   Pain Frequency Constant   Aggravating Factors  increased activity   Pain Relieving Factors at rest            [facility] PT Assessment - 10/01/16 0001      ROM / Strength   AROM / PROM / Strength AROM;PROM     AROM   AROM Assessment Site Shoulder   Right/Left Shoulder Right   Right Shoulder Flexion 85 Degrees   Right Shoulder External Rotation 60 Degrees     PROM   PROM Assessment Site Shoulder   Right/Left Shoulder Right   Right Shoulder Flexion  135 Degrees   Right Shoulder External Rotation 65 Degrees                     [facility] Adult PT Treatment/Exercise - 10/01/16 0001      Shoulder Exercises: Standing   Protraction Strengthening;Right;20 reps;Theraband   Theraband Level (Shoulder Protraction) Level 1 (Yellow)   External Rotation Strengthening;Right;20 reps;Theraband   Theraband Level (Shoulder External Rotation) Level 1 (Yellow)   Internal Rotation Strengthening;Right;20 reps;Theraband   Theraband Level (Shoulder Internal Rotation) Level 1 (Yellow)   Row Strengthening;Right;20 reps;Theraband   Theraband Level (Shoulder Row) Level 1 (Yellow)     Shoulder Exercises: Pulleys   Flexion Limitations 5 minutes.   Other Pulley Exercises Seated UE Ranger flexion and circles x  5 minutes.     Moist Heat Therapy   Number Minutes Moist Heat 15 Minutes   Moist Heat Location Shoulder     Electrical Stimulation   Electrical Stimulation Location Right shoulder.   Electrical Stimulation Action premod   Electrical Stimulation Parameters 80-150hz  x70min   Electrical Stimulation Goals Pain     Manual Therapy   Manual Therapy Passive ROM   Passive  ROM gentle PROM for right shoulder flexion and ER, rhythmis stabs for IR/ER and fle/ext                PT Education - 10/01/16 1418    Education provided Yes   Education Details HEP with yellow t-band   Person(s) Educated Patient   Methods Explanation;Demonstration;Handout   Comprehension Verbalized understanding;Returned demonstration          PT Short Term Goals - 06/12/16 1417      PT SHORT TERM GOAL #1   Title STG's=LTG's.           PT Long Term Goals - 08/19/16 1602      PT LONG TERM GOAL #1   Title Independent with an HEP.   Time 8   Period Weeks   Status On-going     PT LONG TERM GOAL #2   Title Active right shoulder flexion to 150 degrees so the patient can easily reach overhead.   Time 8   Period Weeks   Status On-going     PT LONG  TERM GOAL #3   Title Active ER to 70 degrees+ to allow for easily donning/doffing of apparel   Time 8   Period Weeks   Status On-going     PT LONG TERM GOAL #4   Title Increase right shoulder strength to a solid 4+/5 to increase stability for performance of functional activities   Time 8   Period Weeks   Status On-going     PT LONG TERM GOAL #5   Title Perform ADL's with pain not > 3/10.   Time 8   Period Weeks   Status On-going               Plan - 10/01/16 1439    Clinical Impression Statement Patient tolerated treatment well today. Patient has some ongoing pain and weakness with right shoulder. Patient improved with right shoulder flexion and ER today. Patient able to continue with PRE per MD today. HEP given with yellow t-band. Goals ongoing due to pain, ROM and strength deficts.    Rehab Potential Good   PT Frequency 3x / week   PT Duration 4 weeks   PT Treatment/Interventions ADLs/Self Care Home Management;Electrical Stimulation;Moist Heat;Ultrasound;Patient/family education;Neuromuscular re-education;Therapeutic exercise;Therapeutic activities;Manual techniques;Passive range of motion;Vasopneumatic Device   PT Next Visit Plan Continue with R shoulder ROM and strengthening per MD order in media with modalities.   Consulted and Agree with Plan of Care Patient      Patient will benefit from skilled therapeutic intervention in order to improve the following deficits and impairments:  Pain, Decreased activity tolerance, Decreased range of motion  Visit Diagnosis: Acute pain of right shoulder  Stiffness of right shoulder, not elsewhere classified     Problem List Patient Active Problem List   Diagnosis Date Noted  . Healthcare maintenance 02/12/2016  . Migraine headache with aura 02/12/2016  . Diabetic neuropathy ([place]) 02/06/2016  . Depression   . Encounter for preadmission testing   . Herpes genitalis in women 07/16/2015  . Genital lesion, female 07/12/2015   . Chronic anticoagulation 05/30/2015  . Family history of coronary artery disease in sister 05/30/2015  . Acute on chronic diastolic CHF (congestive heart failure), NYHA class 1 ([place]) 05/30/2015  . Chest pain with moderate risk of acute coronary syndrome 05/29/2015  . Pain in the chest   . Dyspnea   . Essential hypertension   . RLS (restless legs syndrome) 04/27/2015  . Fall 02/26/2015  .  Back pain 02/26/2015  . Lipoma 02/08/2015  . Bipolar disorder ([place]) 01/23/2015  . Insomnia 01/23/2015  . CKD stage 3 due to type 2 diabetes mellitus ([place]) 01/05/2015  . Chronic back pain 01/04/2015  . Permanent atrial fibrillation ([place]) 01/04/2015  . Pruritus 01/04/2015  . Diabetes mellitus with stage 2 chronic kidney disease ([place])   . Hyperlipidemia     Miho Monda P, PTA 10/01/2016, 2:53 PM  [facility] [place], [place], [address] Phone: [phone]   Fax:  [phone]  Name: Forestine Macho MRN: [id number] Date of Birth: 06-Sep-1945

## 2016-10-02 DIAGNOSIS — M9903 Segmental and somatic dysfunction of lumbar region: Secondary | ICD-10-CM | POA: Diagnosis not present

## 2016-10-02 DIAGNOSIS — M47816 Spondylosis without myelopathy or radiculopathy, lumbar region: Secondary | ICD-10-CM | POA: Diagnosis not present

## 2016-10-02 DIAGNOSIS — M62830 Muscle spasm of back: Secondary | ICD-10-CM | POA: Diagnosis not present

## 2016-10-02 DIAGNOSIS — M545 Low back pain: Secondary | ICD-10-CM | POA: Diagnosis not present

## 2016-10-07 ENCOUNTER — Other Ambulatory Visit (HOSPITAL_COMMUNITY): Payer: Self-pay | Admitting: Psychiatry

## 2016-10-07 DIAGNOSIS — F3162 Bipolar disorder, current episode mixed, moderate: Secondary | ICD-10-CM | POA: Diagnosis not present

## 2016-10-08 ENCOUNTER — Ambulatory Visit: Payer: Medicare Other | Admitting: Physical Therapy

## 2016-10-08 DIAGNOSIS — M25611 Stiffness of right shoulder, not elsewhere classified: Secondary | ICD-10-CM

## 2016-10-08 DIAGNOSIS — M25511 Pain in right shoulder: Secondary | ICD-10-CM

## 2016-10-08 LAB — COMPREHENSIVE METABOLIC PANEL
ALT: 16 IU/L (ref 0–32)
AST: 25 IU/L (ref 0–40)
Albumin/Globulin Ratio: 1.6 (ref 1.2–2.2)
Albumin: 4.6 g/dL (ref 3.6–4.8)
Alkaline Phosphatase: 124 IU/L — ABNORMAL HIGH (ref 39–117)
BUN/Creatinine Ratio: 21 (ref 12–28)
BUN: 21 mg/dL (ref 8–27)
Bilirubin Total: 0.6 mg/dL (ref 0.0–1.2)
CO2: 28 mmol/L (ref 18–29)
Calcium: 10.2 mg/dL (ref 8.7–10.3)
Chloride: 99 mmol/L (ref 96–106)
Creatinine, Ser: 0.99 mg/dL (ref 0.57–1.00)
GFR calc Af Amer: 67 mL/min/{1.73_m2} (ref >59)
GFR calc non Af Amer: 58 mL/min/{1.73_m2} — ABNORMAL LOW (ref >59)
Globulin, Total: 2.9 g/dL (ref 1.5–4.5)
Glucose: 203 mg/dL — ABNORMAL HIGH (ref 65–99)
Potassium: 4 mmol/L (ref 3.5–5.2)
Sodium: 144 mmol/L (ref 134–144)
Total Protein: 7.5 g/dL (ref 6.0–8.5)

## 2016-10-08 LAB — CARBAMAZEPINE LEVEL, TOTAL: Carbamazepine (Tegretol), S: 6 ug/mL (ref 4.0–12.0)

## 2016-10-08 LAB — AMBIG ABBREV CMP14 DEFAULT

## 2016-10-08 NOTE — Therapy (Addendum)
[facility] [place], [place], [address] Phone: [phone]   Fax:  [phone]  Physical Therapy Evaluation  Patient Details  Name: Amber Stephenson MRN: [id number] Date of Birth: 08-08-1945 Referring Provider: Netta Cedars MD.  Encounter Date: 10/08/2016      PT End of Session - 10/08/16 1513    Visit Number 19   Number of Visits 27   Date for PT Re-Evaluation 10/21/16   PT Start Time 0145   PT Stop Time 0239   PT Time Calculation (min) 54 min   Activity Tolerance Patient tolerated treatment well   Behavior During Therapy [facility] for tasks assessed/performed      Past Medical History:  Diagnosis Date  . Atrial fibrillation ([place])   . Bipolar affective ([place])   . CHF (congestive heart failure) ([place])   . Chronic back pain   . Diabetes mellitus without complication ([place])   . Hyperlipidemia   . Hypertension   . Pain management   . Renal insufficiency     Past Surgical History:  Procedure Laterality Date  . BREAST REDUCTION SURGERY    . EYE SURGERY Right    cateracts  . SHOULDER SURGERY Right   . THIGH SURGERY      There were no vitals filed for this visit.       Subjective Assessment - 10/08/16 1515    Subjective Patient has noew order to cont 1x week for 4 weks   Patient Stated Goals Regain use of my right shoulder.   Pain Score 5    Pain Location Shoulder   Pain Orientation Right   Pain Type Surgical pain   Pain Onset More than a month ago   Pain Frequency Constant                       [facility] Adult PT Treatment/Exercise - 10/08/16 0001      Exercises   Exercises Shoulder     Shoulder Exercises: Supine   Other Supine Exercises AAROM right shoulder range of motion on a 45 degree incline with flexion and ER and rhy stabs x 15 minutes.     Shoulder Exercises: Pulleys   Flexion Limitations 5 minutes.   Other Pulley Exercises Seated UE ranger x 5 minutes.   Other Pulley Exercises Wall  ladder x 2 minutes.     Modalities   Modalities Chiropractor Stimulation Location Right shoulder.   Electrical Stimulation Action Pre-mod   Electrical Stimulation Parameters 80-150 Hz x 20 minutes.   Electrical Stimulation Goals Pain                  PT Short Term Goals - 06/12/16 1417      PT SHORT TERM GOAL #1   Title STG's=LTG's.           PT Long Term Goals - 08/19/16 1602      PT LONG TERM GOAL #1   Title Independent with an HEP.   Time 8   Period Weeks   Status On-going     PT LONG TERM GOAL #2   Title Active right shoulder flexion to 150 degrees so the patient can easily reach overhead.   Time 8   Period Weeks   Status On-going     PT LONG TERM GOAL #3   Title Active ER to 70 degrees+ to allow for easily donning/doffing of apparel   Time 8  Period Weeks   Status On-going     PT LONG TERM GOAL #4   Title Increase right shoulder strength to a solid 4+/5 to increase stability for performance of functional activities   Time 8   Period Weeks   Status On-going     PT LONG TERM GOAL #5   Title Perform ADL's with pain not > 3/10.   Time 8   Period Weeks   Status On-going               Plan - 10/08/16 1514    Clinical Impression Statement The patient is pleased with her progress.  Her right shoulder pain-level was a 5/10 today.  Flexion on table at 45 degrees of incline was 150 degrees and ER= 75 degrees.      Patient will benefit from skilled therapeutic intervention in order to improve the following deficits and impairments:     Visit Diagnosis: Acute pain of right shoulder  Stiffness of right shoulder, not elsewhere classified     Problem List Patient Active Problem List   Diagnosis Date Noted  . Healthcare maintenance 02/12/2016  . Migraine headache with aura 02/12/2016  . Diabetic neuropathy ([place]) 02/06/2016  . Depression   . Encounter for preadmission testing   .  Herpes genitalis in women 07/16/2015  . Genital lesion, female 07/12/2015  . Chronic anticoagulation 05/30/2015  . Family history of coronary artery disease in sister 05/30/2015  . Acute on chronic diastolic CHF (congestive heart failure), NYHA class 1 ([place]) 05/30/2015  . Chest pain with moderate risk of acute coronary syndrome 05/29/2015  . Pain in the chest   . Dyspnea   . Essential hypertension   . RLS (restless legs syndrome) 04/27/2015  . Fall 02/26/2015  . Back pain 02/26/2015  . Lipoma 02/08/2015  . Bipolar disorder ([place]) 01/23/2015  . Insomnia 01/23/2015  . CKD stage 3 due to type 2 diabetes mellitus ([place]) 01/05/2015  . Chronic back pain 01/04/2015  . Permanent atrial fibrillation ([place]) 01/04/2015  . Pruritus 01/04/2015  . Diabetes mellitus with stage 2 chronic kidney disease ([place])   . Hyperlipidemia     Coley Kulikowski, [place] MPT 10/08/2016, 3:25 PM  [facility] [address] [place], [place], [address] Phone: [phone]   Fax:  [phone]  Name: De Libman MRN: [id number] Date of Birth: July 05, 1945

## 2016-10-09 DIAGNOSIS — M47816 Spondylosis without myelopathy or radiculopathy, lumbar region: Secondary | ICD-10-CM | POA: Diagnosis not present

## 2016-10-09 DIAGNOSIS — M9903 Segmental and somatic dysfunction of lumbar region: Secondary | ICD-10-CM | POA: Diagnosis not present

## 2016-10-09 DIAGNOSIS — M62830 Muscle spasm of back: Secondary | ICD-10-CM | POA: Diagnosis not present

## 2016-10-09 DIAGNOSIS — M545 Low back pain: Secondary | ICD-10-CM | POA: Diagnosis not present

## 2016-10-13 DIAGNOSIS — M62830 Muscle spasm of back: Secondary | ICD-10-CM | POA: Diagnosis not present

## 2016-10-13 DIAGNOSIS — M47816 Spondylosis without myelopathy or radiculopathy, lumbar region: Secondary | ICD-10-CM | POA: Diagnosis not present

## 2016-10-13 DIAGNOSIS — M9903 Segmental and somatic dysfunction of lumbar region: Secondary | ICD-10-CM | POA: Diagnosis not present

## 2016-10-13 DIAGNOSIS — M545 Low back pain: Secondary | ICD-10-CM | POA: Diagnosis not present

## 2016-10-14 DIAGNOSIS — H2512 Age-related nuclear cataract, left eye: Secondary | ICD-10-CM | POA: Diagnosis not present

## 2016-10-14 DIAGNOSIS — H25812 Combined forms of age-related cataract, left eye: Secondary | ICD-10-CM | POA: Diagnosis not present

## 2016-10-14 DIAGNOSIS — H25811 Combined forms of age-related cataract, right eye: Secondary | ICD-10-CM | POA: Diagnosis not present

## 2016-10-14 DIAGNOSIS — H25012 Cortical age-related cataract, left eye: Secondary | ICD-10-CM | POA: Diagnosis not present

## 2016-10-16 ENCOUNTER — Ambulatory Visit: Payer: Medicare Other | Admitting: Physical Therapy

## 2016-10-16 ENCOUNTER — Encounter: Payer: Self-pay | Admitting: Physical Therapy

## 2016-10-16 DIAGNOSIS — M25611 Stiffness of right shoulder, not elsewhere classified: Secondary | ICD-10-CM

## 2016-10-16 DIAGNOSIS — M25511 Pain in right shoulder: Secondary | ICD-10-CM

## 2016-10-16 NOTE — Therapy (Addendum)
[facility] [place], [place], [address] Phone: [phone]   Fax:  [phone]  Physical Therapy Treatment  Patient Details  Name: Amber Stephenson MRN: [id number] Date of Birth: 11-May-1946 Referring Provider: Netta Cedars MD.  Encounter Date: 10/16/2016      PT End of Session - 10/16/16 1343    Visit Number 20   Number of Visits 27   Date for PT Re-Evaluation 10/21/16   PT Start Time 1324   PT Stop Time 1436   PT Time Calculation (min) 51 min   Activity Tolerance Patient tolerated treatment well   Behavior During Therapy [facility] for tasks assessed/performed      Past Medical History:  Diagnosis Date  . Atrial fibrillation ([place])   . Bipolar affective ([place])   . CHF (congestive heart failure) ([place])   . Chronic back pain   . Diabetes mellitus without complication ([place])   . Hyperlipidemia   . Hypertension   . Pain management   . Renal insufficiency     Past Surgical History:  Procedure Laterality Date  . BREAST REDUCTION SURGERY    . EYE SURGERY Right    cateracts  . SHOULDER SURGERY Right   . THIGH SURGERY      There were no vitals filed for this visit.      Subjective Assessment - 10/16/16 1343    Subjective Reports more pain today as she had to use heat at home.   Patient Stated Goals Regain use of my right shoulder.   Currently in Pain? Yes   Pain Score 7    Pain Location Shoulder   Pain Orientation Right   Pain Descriptors / Indicators Sharp   Pain Type Surgical pain   Pain Onset More than a month ago            [facility] PT Assessment - 10/16/16 0001      Assessment   Medical Diagnosis S/p right shoulder arthoscopy.   Onset Date/Surgical Date 06/09/16   Next MD Visit 10/21/2016     Restrictions   Weight Bearing Restrictions No     ROM / Strength   AROM / PROM / Strength AROM     AROM   AROM Assessment Site Shoulder   Right/Left Shoulder Right   Right Shoulder Flexion 115 Degrees   Right Shoulder Internal Rotation 80 Degrees   Right Shoulder External Rotation 30 Degrees   Right Shoulder Horizontal  ADduction --                     [facility] Adult PT Treatment/Exercise - 10/16/16 0001      Shoulder Exercises: Supine   External Rotation AAROM;Right;20 reps     Shoulder Exercises: Standing   External Rotation Strengthening;Right;20 reps;Theraband   Theraband Level (Shoulder External Rotation) Level 1 (Yellow)   Internal Rotation Strengthening;Right;20 reps;Theraband   Theraband Level (Shoulder Internal Rotation) Level 1 (Yellow)   Flexion Strengthening;Both;15 reps   Extension Strengthening;Right;20 reps;Theraband   Theraband Level (Shoulder Extension) Level 1 (Yellow)   Row Strengthening;Right;20 reps;Theraband   Theraband Level (Shoulder Row) Level 1 (Yellow)   Other Standing Exercises RUE wall slides x15 reps     Shoulder Exercises: Pulleys   Flexion Other (comment)  x5 min   Other Pulley Exercises Seated UE ranger x 5 minutes.     Modalities   Modalities Electrical Stimulation;Moist Heat     Moist Heat Therapy   Number Minutes Moist Heat 15 Minutes   Moist  Heat Location Shoulder     Electrical Stimulation   Electrical Stimulation Location R shoulder   Electrical Stimulation Action Pre-Mod   Electrical Stimulation Parameters 80-150 hz x15 min   Electrical Stimulation Goals Pain     Manual Therapy   Manual Therapy Passive ROM   Passive ROM Gentle PROM of R shoulder into ER/IR/ attempt at flexion with oscillations due to spasm/pain                  PT Short Term Goals - 06/12/16 1417      PT SHORT TERM GOAL #1   Title STG's=LTG's.           PT Long Term Goals - 08/19/16 1602      PT LONG TERM GOAL #1   Title Independent with an HEP.   Time 8   Period Weeks   Status On-going     PT LONG TERM GOAL #2   Title Active right shoulder flexion to 150 degrees so the patient can easily reach overhead.   Time 8   Period  Weeks   Status On-going     PT LONG TERM GOAL #3   Title Active ER to 70 degrees+ to allow for easily donning/doffing of apparel   Time 8   Period Weeks   Status On-going     PT LONG TERM GOAL #4   Title Increase right shoulder strength to a solid 4+/5 to increase stability for performance of functional activities   Time 8   Period Weeks   Status On-going     PT LONG TERM GOAL #5   Title Perform ADL's with pain not > 3/10.   Time 8   Period Weeks   Status On-going               Plan - 10/16/16 1427    Clinical Impression Statement Patient presented in clinic with increased pain but reported increased activity yesterday. Patient able to complete exercises gently and demonstrated R shoulder fatigue especially with ER and flexion. Mild R UT compensation with AROM flexion with VCs and tactile cueing to reduce UT involvement. Tightness in R shoulder ER both actively and passively today. Patient experienced spasm in R lateral Pec today with descending into neutral position from flexion that reduced with oscillations.  AROM R shoulder flex 115 deg, ER 30 deg, IR 80 deg. Normal modalities response noted following removal of the modalities.   Rehab Potential Good   Clinical Impairments Affecting Rehab Potential surgery 06/09/16 current 2 weeks 06/23/16   PT Frequency 3x / week   PT Duration 4 weeks   PT Treatment/Interventions ADLs/Self Care Home Management;Electrical Stimulation;Moist Heat;Ultrasound;Patient/family education;Neuromuscular re-education;Therapeutic exercise;Therapeutic activities;Manual techniques;Passive range of motion;Vasopneumatic Device   PT Next Visit Plan Continue per MD discretion.   PT Home Exercise Plan HEP- isometrics   Consulted and Agree with Plan of Care Patient      Patient will benefit from skilled therapeutic intervention in order to improve the following deficits and impairments:  Pain, Decreased activity tolerance, Decreased range of motion  Visit  Diagnosis: Acute pain of right shoulder  Stiffness of right shoulder, not elsewhere classified     Problem List Patient Active Problem List   Diagnosis Date Noted  . Healthcare maintenance 02/12/2016  . Migraine headache with aura 02/12/2016  . Diabetic neuropathy ([place]) 02/06/2016  . Depression   . Encounter for preadmission testing   . Herpes genitalis in women 07/16/2015  . Genital lesion, female 07/12/2015  .  Chronic anticoagulation 05/30/2015  . Family history of coronary artery disease in sister 05/30/2015  . Acute on chronic diastolic CHF (congestive heart failure), NYHA class 1 ([place]) 05/30/2015  . Chest pain with moderate risk of acute coronary syndrome 05/29/2015  . Pain in the chest   . Dyspnea   . Essential hypertension   . RLS (restless legs syndrome) 04/27/2015  . Fall 02/26/2015  . Back pain 02/26/2015  . Lipoma 02/08/2015  . Bipolar disorder ([place]) 01/23/2015  . Insomnia 01/23/2015  . CKD stage 3 due to type 2 diabetes mellitus ([place]) 01/05/2015  . Chronic back pain 01/04/2015  . Permanent atrial fibrillation ([place]) 01/04/2015  . Pruritus 01/04/2015  . Diabetes mellitus with stage 2 chronic kidney disease ([place])   . Hyperlipidemia    Ahmed Prima, PTA 10/16/16 2:45 PM Mali Applegate MPT [facility] [address] [place], [place], [address] Phone: [phone]   Fax:  [phone]  Name: Eretria Manternach MRN: [id number] Date of Birth: 07/13/1945  PHYSICAL THERAPY DISCHARGE SUMMARY  Visits from Start of Care: 20.  Current functional level related to goals / functional outcomes: See above.   Remaining deficits: See below.   Education / Equipment: HEP.  Plan: Patient agrees to discharge.  Patient goals were not met. Patient is being discharged due to not returning since the last visit.  ?????         Mali Applegate MPT

## 2016-10-21 ENCOUNTER — Other Ambulatory Visit: Payer: Self-pay | Admitting: Family Medicine

## 2016-10-21 DIAGNOSIS — M25511 Pain in right shoulder: Secondary | ICD-10-CM | POA: Diagnosis not present

## 2016-10-21 DIAGNOSIS — G8929 Other chronic pain: Secondary | ICD-10-CM | POA: Diagnosis not present

## 2016-10-21 DIAGNOSIS — Z4789 Encounter for other orthopedic aftercare: Secondary | ICD-10-CM | POA: Diagnosis not present

## 2016-10-22 ENCOUNTER — Other Ambulatory Visit: Payer: Self-pay | Admitting: Family Medicine

## 2016-10-24 DIAGNOSIS — Z79899 Other long term (current) drug therapy: Secondary | ICD-10-CM | POA: Diagnosis not present

## 2016-10-24 DIAGNOSIS — E559 Vitamin D deficiency, unspecified: Secondary | ICD-10-CM | POA: Diagnosis not present

## 2016-10-24 DIAGNOSIS — I10 Essential (primary) hypertension: Secondary | ICD-10-CM | POA: Diagnosis not present

## 2016-10-24 DIAGNOSIS — D509 Iron deficiency anemia, unspecified: Secondary | ICD-10-CM | POA: Diagnosis not present

## 2016-10-24 DIAGNOSIS — R809 Proteinuria, unspecified: Secondary | ICD-10-CM | POA: Diagnosis not present

## 2016-10-24 DIAGNOSIS — N183 Chronic kidney disease, stage 3 (moderate): Secondary | ICD-10-CM | POA: Diagnosis not present

## 2016-10-27 ENCOUNTER — Other Ambulatory Visit (HOSPITAL_COMMUNITY): Payer: Self-pay

## 2016-10-27 NOTE — Telephone Encounter (Signed)
Medication refill request - Fax from [facility] received for a refill of Zaleplon ([place]).  Medication not mentioned in last evaluation but appears to be have been discontinued 09/18/16.

## 2016-10-28 ENCOUNTER — Ambulatory Visit (INDEPENDENT_AMBULATORY_CARE_PROVIDER_SITE_OTHER): Payer: Medicare Other | Admitting: Family Medicine

## 2016-10-28 ENCOUNTER — Encounter: Payer: Self-pay | Admitting: Family Medicine

## 2016-10-28 VITALS — BP 114/73 | HR 84 | Temp 97.9°F | Ht 62.0 in | Wt 193.0 lb

## 2016-10-28 DIAGNOSIS — J0100 Acute maxillary sinusitis, unspecified: Secondary | ICD-10-CM

## 2016-10-28 MED ORDER — BETAMETHASONE SOD PHOS & ACET 6 (3-3) MG/ML IJ SUSP
6.0000 mg | Freq: Once | INTRAMUSCULAR | Status: AC
  Administered 2016-10-28: 6 mg via INTRAMUSCULAR

## 2016-10-28 MED ORDER — AMOXICILLIN-POT CLAVULANATE 875-125 MG PO TABS
1.0000 | ORAL_TABLET | Freq: Two times a day (BID) | ORAL | 0 refills | Status: DC
Start: 2016-10-28 — End: 2016-11-11

## 2016-10-28 NOTE — Progress Notes (Signed)
Chief Complaint  Patient presents with  . Sinusitis    pt here today c/o sinus pressure, sneezing, ears popping, headache, runny nose and congestion.    HPI  Patient presents today for Patient presents with upper respiratory congestion. Rhinorrhea that is frequently purulent. There is moderate sore throat. Patient reports coughing minimally . No sputum noted. There is no fever, chills or sweats. The patient denies being short of breath. Onset was 3-5 days ago. Gradually worsening. Tried OTCs without improvement.   PMH: Smoking status noted ROS: Per HPI  Objective: BP 114/73   Pulse 84   Temp 97.9 F (36.6 C) (Oral)   Ht 5\' 2"  (1.575 m)   Wt 193 lb (87.5 kg)   BMI 35.30 kg/m  Gen: NAD, alert, cooperative with exam HEENT: NCAT, EOMI, PERRL. Maxillary sinuses tender bilaterally. Nasal passages swollen erythematous. Pharynx has posterior erythema with drainage tracts. CV: RRR, good S1/S2, no murmur Resp: CTABL, no wheezes, non-labored Abd: SNTND, BS present, no guarding or organomegaly Ext: No edema, warm Neuro: Alert and oriented, No gross deficits  Assessment and plan:  1. Acute maxillary sinusitis, recurrence not specified     No orders of the defined types were placed in this encounter.   No orders of the defined types were placed in this encounter.   Follow up as needed.  [name], MD

## 2016-10-29 DIAGNOSIS — N183 Chronic kidney disease, stage 3 (moderate): Secondary | ICD-10-CM | POA: Diagnosis not present

## 2016-10-29 DIAGNOSIS — I10 Essential (primary) hypertension: Secondary | ICD-10-CM | POA: Diagnosis not present

## 2016-10-29 DIAGNOSIS — E559 Vitamin D deficiency, unspecified: Secondary | ICD-10-CM | POA: Diagnosis not present

## 2016-10-29 DIAGNOSIS — E870 Hyperosmolality and hypernatremia: Secondary | ICD-10-CM | POA: Diagnosis not present

## 2016-11-04 DIAGNOSIS — G8929 Other chronic pain: Secondary | ICD-10-CM | POA: Diagnosis not present

## 2016-11-04 DIAGNOSIS — M47816 Spondylosis without myelopathy or radiculopathy, lumbar region: Secondary | ICD-10-CM | POA: Diagnosis not present

## 2016-11-04 DIAGNOSIS — Z79891 Long term (current) use of opiate analgesic: Secondary | ICD-10-CM | POA: Diagnosis not present

## 2016-11-05 DIAGNOSIS — M545 Low back pain: Secondary | ICD-10-CM | POA: Diagnosis not present

## 2016-11-05 DIAGNOSIS — M62830 Muscle spasm of back: Secondary | ICD-10-CM | POA: Diagnosis not present

## 2016-11-05 DIAGNOSIS — M47816 Spondylosis without myelopathy or radiculopathy, lumbar region: Secondary | ICD-10-CM | POA: Diagnosis not present

## 2016-11-05 DIAGNOSIS — M9903 Segmental and somatic dysfunction of lumbar region: Secondary | ICD-10-CM | POA: Diagnosis not present

## 2016-11-06 MED ORDER — ZALEPLON 10 MG PO CAPS: 10.0000 mg | ORAL_CAPSULE | Freq: Every evening | ORAL | 0 refills | Status: DC | PRN

## 2016-11-10 ENCOUNTER — Telehealth: Payer: Self-pay | Admitting: Family Medicine

## 2016-11-10 MED ORDER — FLUCONAZOLE 150 MG PO TABS: 150.0000 mg | ORAL_TABLET | Freq: Once | ORAL | 0 refills | Status: AC

## 2016-11-10 NOTE — Telephone Encounter (Signed)
Pt gets yeast infections when she takes antibiotics and was given an antibiotic for sinus infection 10/28/16. Diflucan 150mg  1PO now sent to pharmacy and pt advised if symptoms persist or worsen will ntbs for evaluation. Pt voiced understanding.

## 2016-11-11 ENCOUNTER — Encounter: Payer: Self-pay | Admitting: Family Medicine

## 2016-11-11 ENCOUNTER — Ambulatory Visit (INDEPENDENT_AMBULATORY_CARE_PROVIDER_SITE_OTHER): Payer: Medicare Other | Admitting: Family Medicine

## 2016-11-11 VITALS — BP 119/81 | HR 79 | Temp 97.1°F | Ht 62.0 in | Wt 194.0 lb

## 2016-11-11 DIAGNOSIS — R1084 Generalized abdominal pain: Secondary | ICD-10-CM | POA: Diagnosis not present

## 2016-11-11 DIAGNOSIS — B373 Candidiasis of vulva and vagina: Secondary | ICD-10-CM | POA: Diagnosis not present

## 2016-11-11 DIAGNOSIS — B3731 Acute candidiasis of vulva and vagina: Secondary | ICD-10-CM

## 2016-11-11 MED ORDER — FLUCONAZOLE 150 MG PO TABS: ORAL_TABLET | ORAL | 0 refills | Status: DC

## 2016-11-11 MED ORDER — HYOSCYAMINE SULFATE ER 0.375 MG PO TB12: 0.3750 mg | ORAL_TABLET | Freq: Two times a day (BID) | ORAL | 0 refills | Status: DC

## 2016-11-11 NOTE — Progress Notes (Signed)
   HPI  Patient presents today here with abdominal pain.  Patient also complains of vaginal yeast infection after taking Augmentin recently. She took 1 pill of Diflucan with temporary relief and return of symptoms.  Abdominal pain 1-2 years of abdominal pain, described as crampy and diffuse abdominal pain, often also localized from the left lower quadrant. Previous colonoscopy was clear- about 7 years ago. Patient has intermittent constipation with loose stools. She explains that constipation last 2-3 days, then 2-3 days of moderately loose stools up to 7 a day. She would like to try something for bowel spasms.  PMH: Smoking status noted ROS: Per HPI  Objective: BP 119/81   Pulse 79   Temp 97.1 F (36.2 C) (Oral)   Ht 5\' 2"  (1.575 m)   Wt 194 lb (88 kg)   BMI 35.48 kg/m  Gen: NAD, alert, cooperative with exam HEENT: NCAT CV: RRR, good S1/S2, no murmur Resp: CTABL, no wheezes, non-labored Abd: SNTND, BS present, no guarding or organomegaly Ext: No edema, warm Neuro: Alert and oriented, No gross deficits  Assessment and plan:  # Generalized abdominal pain Patient with generalized abdominal pain, left lower quadrant bowel pain, and alternating constipation and diarrhea. No hematochezia, melena, or persistent nausea. Likely irritable bowel syndrome. Refer to GI, she requests seeing a different GI than her previous one that she saw about 2 years ago. Trial of hyoscyamine  # Yeast vaginitis Treat with Diflucan, occurring after Augmentin course.   Orders Placed This Encounter  Procedures  . Ambulatory referral to Gastroenterology    Referral Priority:   Routine    Referral Type:   Consultation    Referral Reason:   Specialty Services Required    Number of Visits Requested:   1    Meds ordered this encounter  Medications  . hyoscyamine (LEVBID) 0.375 MG 12 hr tablet    Sig: Take 1 tablet (0.375 mg total) by mouth 2 (two) times daily.    Dispense:  60 tablet   Refill:  0  . fluconazole (DIFLUCAN) 150 MG tablet    Sig: Take one pill and repeat in 3 days    Dispense:  2 tablet    Refill:  0    Laroy Apple, MD Tristan Schroeder [facility] 11/11/2016, 4:26 PM

## 2016-11-11 NOTE — Patient Instructions (Signed)
Great to see you!  We are working on a referral.    I have sent levbid ( hyosciamine) to the pharmacy for you to help with bowel spasms  Try diflucan for the yeast infection.

## 2016-11-17 ENCOUNTER — Other Ambulatory Visit: Payer: Self-pay | Admitting: Cardiovascular Disease

## 2016-11-17 ENCOUNTER — Other Ambulatory Visit: Payer: Self-pay | Admitting: Family Medicine

## 2016-11-20 DIAGNOSIS — M47816 Spondylosis without myelopathy or radiculopathy, lumbar region: Secondary | ICD-10-CM | POA: Diagnosis not present

## 2016-11-20 DIAGNOSIS — M62830 Muscle spasm of back: Secondary | ICD-10-CM | POA: Diagnosis not present

## 2016-11-20 DIAGNOSIS — M9903 Segmental and somatic dysfunction of lumbar region: Secondary | ICD-10-CM | POA: Diagnosis not present

## 2016-11-20 DIAGNOSIS — M545 Low back pain: Secondary | ICD-10-CM | POA: Diagnosis not present

## 2016-11-26 ENCOUNTER — Other Ambulatory Visit: Payer: Self-pay | Admitting: Orthopedic Surgery

## 2016-11-26 DIAGNOSIS — G8929 Other chronic pain: Secondary | ICD-10-CM

## 2016-11-26 DIAGNOSIS — Z4789 Encounter for other orthopedic aftercare: Secondary | ICD-10-CM | POA: Diagnosis not present

## 2016-11-26 DIAGNOSIS — M25511 Pain in right shoulder: Principal | ICD-10-CM

## 2016-11-26 DIAGNOSIS — T8484XA Pain due to internal orthopedic prosthetic devices, implants and grafts, initial encounter: Secondary | ICD-10-CM | POA: Diagnosis not present

## 2016-11-28 ENCOUNTER — Ambulatory Visit (INDEPENDENT_AMBULATORY_CARE_PROVIDER_SITE_OTHER): Payer: Medicare Other

## 2016-11-28 ENCOUNTER — Ambulatory Visit (INDEPENDENT_AMBULATORY_CARE_PROVIDER_SITE_OTHER): Payer: Medicare Other | Admitting: Nurse Practitioner

## 2016-11-28 ENCOUNTER — Encounter: Payer: Self-pay | Admitting: Nurse Practitioner

## 2016-11-28 VITALS — BP 120/85 | HR 76 | Temp 97.0°F | Ht 62.0 in | Wt 195.0 lb

## 2016-11-28 DIAGNOSIS — K5900 Constipation, unspecified: Secondary | ICD-10-CM

## 2016-11-28 DIAGNOSIS — R109 Unspecified abdominal pain: Secondary | ICD-10-CM

## 2016-11-28 LAB — URINALYSIS, COMPLETE
Bilirubin, UA: NEGATIVE
Glucose, UA: NEGATIVE
Ketones, UA: NEGATIVE
Nitrite, UA: NEGATIVE
Protein, UA: NEGATIVE
RBC, UA: NEGATIVE
Specific Gravity, UA: 1.01 (ref 1.005–1.030)
Urobilinogen, Ur: 0.2 mg/dL (ref 0.2–1.0)
pH, UA: 5.5 (ref 5.0–7.5)

## 2016-11-28 LAB — MICROSCOPIC EXAMINATION

## 2016-11-28 NOTE — Patient Instructions (Signed)
Constipation, Adult °Constipation is when a person: °· Poops (has a bowel movement) fewer times in a week than normal. °· Has a hard time pooping. °· Has poop that is dry, hard, or bigger than normal. ° °Follow these instructions at home: °Eating and drinking ° °· Eat foods that have a lot of fiber, such as: °? Fresh fruits and vegetables. °? Whole grains. °? Beans. °· Eat less of foods that are high in fat, low in fiber, or overly processed, such as: °? French fries. °? Hamburgers. °? Cookies. °? Candy. °? Soda. °· Drink enough fluid to keep your pee (urine) clear or pale yellow. °General instructions °· Exercise regularly or as told by your doctor. °· Go to the restroom when you feel like you need to poop. Do not hold it in. °· Take over-the-counter and prescription medicines only as told by your doctor. These include any fiber supplements. °· Do pelvic floor retraining exercises, such as: °? Doing deep breathing while relaxing your lower belly (abdomen). °? Relaxing your pelvic floor while pooping. °· Watch your condition for any changes. °· Keep all follow-up visits as told by your doctor. This is important. °Contact a doctor if: °· You have pain that gets worse. °· You have a fever. °· You have not pooped for 4 days. °· You throw up (vomit). °· You are not hungry. °· You lose weight. °· You are bleeding from the anus. °· You have thin, pencil-like poop (stool). °Get help right away if: °· You have a fever, and your symptoms suddenly get worse. °· You leak poop or have blood in your poop. °· Your belly feels hard or bigger than normal (is bloated). °· You have very bad belly pain. °· You feel dizzy or you faint. °This information is not intended to replace advice given to you by your health care provider. Make sure you discuss any questions you have with your health care provider. °Document Released: 11/05/2007 Document Revised: 12/07/2015 Document Reviewed: 11/07/2015 °Elsevier Interactive Patient Education ©  2017 Elsevier Inc. ° °

## 2016-11-28 NOTE — Progress Notes (Signed)
   Subjective:    Patient ID: Amber Stephenson, female    DOB: 04-17-1946, 71 y.o.   MRN: [id number]  HPI Patient in the office with complaints of left mid back/side pain.  Patient states she has been having this pain for about 2 weeks.  Pain is described as "muscular" pain that aches and lingers.  Pain is rated as 9/10 and constant.  Patient has tried heat, but this did not really help.  Patient at first believed she pulled a muscle, but now that the pain has been constant for more than 2 weeks, she is concerned it may be related to her kidney disease.     Review of Systems  Constitutional: Negative for activity change, appetite change and fever.  Respiratory: Negative for cough and shortness of breath.   Cardiovascular: Negative for chest pain and palpitations.  Gastrointestinal: Negative for abdominal distention.  Genitourinary: Positive for flank pain (left, x2 weeks) and frequency (about 2 weeks). Negative for difficulty urinating, dysuria, hematuria, pelvic pain and urgency.       Intermittent burning with urination  All other systems reviewed and are negative.      Objective:   Physical Exam  Constitutional: She is oriented to person, place, and time. She appears well-developed and well-nourished. No distress.  HENT:  Head: Normocephalic.  Eyes: Pupils are equal, round, and reactive to light.  Neck: Normal range of motion. No JVD present. No thyromegaly present.  Cardiovascular: Normal rate, regular rhythm and normal heart sounds.   Pulmonary/Chest: Effort normal and breath sounds normal. No respiratory distress.  Abdominal: Soft. Bowel sounds are normal. She exhibits no distension. There is no tenderness.  Genitourinary:  Genitourinary Comments: Left CVA tenderness  Musculoskeletal: Normal range of motion.       Arms: Lymphadenopathy:    She has no cervical adenopathy.  Neurological: She is alert and oriented to person, place, and time.  Skin: Skin is warm and dry.    Psychiatric: She has a normal mood and affect. Her behavior is normal. Judgment and thought content normal.    BP 120/85   Pulse 76   Temp 97 F (36.1 C) (Oral)   Ht 5\' 2"  (1.575 m)   Wt 195 lb (88.5 kg)   BMI 35.67 kg/m     UA- 1+leuks KUB- moderate stool burden throughout colon Assessment & Plan:   1. Left flank pain   2. Constipation, unspecified constipation type    Mag citrate OTC Increase fiber in diet If not better by Monday will order CT scan  Orders Placed This Encounter  Procedures  . DG Abd 1 View    Standing Status:   Future    Number of Occurrences:   1    Standing Expiration Date:   01/28/2018    Order Specific Question:   Reason for Exam (SYMPTOM  OR DIAGNOSIS REQUIRED)    Answer:   left flank pain    Order Specific Question:   Preferred imaging location?    Answer:   Internal  . Urinalysis, Complete   Patient will call me over the weekend if worsens  Mary-Margaret Hassell Done, FNP

## 2016-11-29 LAB — BMP8+EGFR
BUN/Creatinine Ratio: 19 (ref 12–28)
BUN: 22 mg/dL (ref 8–27)
CO2: 25 mmol/L (ref 20–29)
Calcium: 9.7 mg/dL (ref 8.7–10.3)
Chloride: 102 mmol/L (ref 96–106)
Creatinine, Ser: 1.16 mg/dL — ABNORMAL HIGH (ref 0.57–1.00)
GFR calc Af Amer: 56 mL/min/{1.73_m2} — ABNORMAL LOW (ref >59)
GFR calc non Af Amer: 48 mL/min/{1.73_m2} — ABNORMAL LOW (ref >59)
Glucose: 157 mg/dL — ABNORMAL HIGH (ref 65–99)
Potassium: 4 mmol/L (ref 3.5–5.2)
Sodium: 143 mmol/L (ref 134–144)

## 2016-11-29 LAB — CBC WITH DIFFERENTIAL/PLATELET
Basophils Absolute: 0 10*3/uL (ref 0.0–0.2)
Basos: 0 %
EOS (ABSOLUTE): 0.3 10*3/uL (ref 0.0–0.4)
Eos: 2 %
Hematocrit: 40.8 % (ref 34.0–46.6)
Hemoglobin: 13.2 g/dL (ref 11.1–15.9)
Immature Grans (Abs): 0 10*3/uL (ref 0.0–0.1)
Immature Granulocytes: 0 %
Lymphocytes Absolute: 2.8 10*3/uL (ref 0.7–3.1)
Lymphs: 20 %
MCH: 28.8 pg (ref 26.6–33.0)
MCHC: 32.4 g/dL (ref 31.5–35.7)
MCV: 89 fL (ref 79–97)
Monocytes Absolute: 0.9 10*3/uL (ref 0.1–0.9)
Monocytes: 6 %
Neutrophils Absolute: 9.9 10*3/uL — ABNORMAL HIGH (ref 1.4–7.0)
Neutrophils: 72 %
Platelets: 242 10*3/uL (ref 150–379)
RBC: 4.58 x10E6/uL (ref 3.77–5.28)
RDW: 15.6 % — ABNORMAL HIGH (ref 12.3–15.4)
WBC: 13.9 10*3/uL — ABNORMAL HIGH (ref 3.4–10.8)

## 2016-11-30 ENCOUNTER — Other Ambulatory Visit: Payer: Self-pay | Admitting: Family Medicine

## 2016-12-01 ENCOUNTER — Other Ambulatory Visit: Payer: Self-pay | Admitting: Orthopedic Surgery

## 2016-12-01 ENCOUNTER — Other Ambulatory Visit: Payer: Self-pay

## 2016-12-01 DIAGNOSIS — R1084 Generalized abdominal pain: Secondary | ICD-10-CM

## 2016-12-01 DIAGNOSIS — M549 Dorsalgia, unspecified: Secondary | ICD-10-CM | POA: Diagnosis not present

## 2016-12-01 DIAGNOSIS — R109 Unspecified abdominal pain: Secondary | ICD-10-CM

## 2016-12-01 DIAGNOSIS — G8929 Other chronic pain: Secondary | ICD-10-CM

## 2016-12-01 DIAGNOSIS — M25511 Pain in right shoulder: Principal | ICD-10-CM

## 2016-12-01 DIAGNOSIS — K582 Mixed irritable bowel syndrome: Secondary | ICD-10-CM | POA: Diagnosis not present

## 2016-12-02 ENCOUNTER — Ambulatory Visit
Admission: RE | Admit: 2016-12-02 | Discharge: 2016-12-02 | Disposition: A | Discharge status: Home / Self Care | Payer: Medicare Other | Source: Ambulatory Visit | Attending: Orthopedic Surgery | Admitting: Orthopedic Surgery

## 2016-12-02 DIAGNOSIS — G8929 Other chronic pain: Secondary | ICD-10-CM

## 2016-12-02 DIAGNOSIS — M25511 Pain in right shoulder: Principal | ICD-10-CM

## 2016-12-02 DIAGNOSIS — S42201A Unspecified fracture of upper end of right humerus, initial encounter for closed fracture: Secondary | ICD-10-CM | POA: Diagnosis not present

## 2016-12-08 ENCOUNTER — Encounter: Payer: Self-pay | Admitting: Cardiovascular Disease

## 2016-12-08 ENCOUNTER — Ambulatory Visit (HOSPITAL_COMMUNITY)
Admission: RE | Admit: 2016-12-08 | Discharge: 2016-12-08 | Disposition: A | Discharge status: Home / Self Care | Payer: Medicare Other | Source: Ambulatory Visit | Attending: Cardiovascular Disease | Admitting: Cardiovascular Disease

## 2016-12-08 ENCOUNTER — Ambulatory Visit (INDEPENDENT_AMBULATORY_CARE_PROVIDER_SITE_OTHER): Payer: Medicare Other | Admitting: Cardiovascular Disease

## 2016-12-08 ENCOUNTER — Ambulatory Visit (HOSPITAL_COMMUNITY)
Admission: RE | Admit: 2016-12-08 | Discharge: 2016-12-08 | Disposition: A | Discharge status: Home / Self Care | Payer: Medicare Other | Source: Ambulatory Visit | Attending: Nurse Practitioner | Admitting: Nurse Practitioner

## 2016-12-08 VITALS — BP 106/74 | HR 92 | Ht 62.0 in | Wt 192.0 lb

## 2016-12-08 DIAGNOSIS — R0602 Shortness of breath: Secondary | ICD-10-CM

## 2016-12-08 DIAGNOSIS — E782 Mixed hyperlipidemia: Secondary | ICD-10-CM | POA: Diagnosis not present

## 2016-12-08 DIAGNOSIS — K4090 Unilateral inguinal hernia, without obstruction or gangrene, not specified as recurrent: Secondary | ICD-10-CM | POA: Diagnosis not present

## 2016-12-08 DIAGNOSIS — I700 Atherosclerosis of aorta: Secondary | ICD-10-CM | POA: Diagnosis not present

## 2016-12-08 DIAGNOSIS — I4821 Permanent atrial fibrillation: Secondary | ICD-10-CM

## 2016-12-08 DIAGNOSIS — I482 Chronic atrial fibrillation: Secondary | ICD-10-CM

## 2016-12-08 DIAGNOSIS — I517 Cardiomegaly: Secondary | ICD-10-CM | POA: Diagnosis not present

## 2016-12-08 DIAGNOSIS — R0600 Dyspnea, unspecified: Secondary | ICD-10-CM | POA: Diagnosis not present

## 2016-12-08 DIAGNOSIS — I5032 Chronic diastolic (congestive) heart failure: Secondary | ICD-10-CM

## 2016-12-08 DIAGNOSIS — R1084 Generalized abdominal pain: Secondary | ICD-10-CM

## 2016-12-08 DIAGNOSIS — R109 Unspecified abdominal pain: Secondary | ICD-10-CM | POA: Diagnosis not present

## 2016-12-08 DIAGNOSIS — M4854XA Collapsed vertebra, not elsewhere classified, thoracic region, initial encounter for fracture: Secondary | ICD-10-CM | POA: Insufficient documentation

## 2016-12-08 DIAGNOSIS — I10 Essential (primary) hypertension: Secondary | ICD-10-CM

## 2016-12-08 NOTE — Patient Instructions (Addendum)
Medication Instructions:   Increase Lasix 40mg  twice a day x 3 days, then back to 20mg  twice a day.  Continue all other medications.    Labwork: none  Testing/Procedures:  A chest x-ray takes a picture of the organs and structures inside the chest, including the heart, lungs, and blood vessels. This test can show several things, including, whether the heart is enlarges; whether fluid is building up in the lungs; and whether pacemaker / defibrillator leads are still in place.  Office will contact with results via phone or letter.    Follow-Up: Your physician wants you to follow up in:  1 year.  You will receive a reminder letter in the mail one-two months in advance.  If you don't receive a letter, please call our office to schedule the follow up appointment   Any Other Special Instructions Will Be Listed Below (If Applicable).  If you need a refill on your cardiac medications before your next appointment, please call your pharmacy.

## 2016-12-08 NOTE — Progress Notes (Signed)
SUBJECTIVE: The patient presents for follow-up of atrial fibrillation and chronic diastolic heart failure.  ECG performed in the office today which I ordered an personally interpreted demonstrated atrial fibrillation, heart rate 90 bpm, possible old inferior infarct, undulating artifact with nonspecific ST segment and T-wave abnormalities.  Last week when temperatures were hot and humid in the mid 90s, she developed shortness of breath. There was audible wheezing. She denies fevers. She does have seasonal allergies. She had a nonproductive cough. She feels better today. She denies chest pain and palpitations. She also denies leg swelling.   Review of Systems: As per "subjective", otherwise negative.  Allergies  Allergen Reactions  . Ivp Dye [Iodinated Diagnostic Agents] Swelling    Throat closes    Current Outpatient Prescriptions  Medication Sig Dispense Refill  . apixaban (ELIQUIS) 5 MG TABS tablet Take 5 mg by mouth 2 (two) times daily.    . ARIPiprazole (ABILIFY) 10 MG tablet 3 qam 270 tablet 2  . atorvastatin (LIPITOR) 40 MG tablet TAKE 1 TABLET DAILY 90 tablet 1  . Blood Glucose Monitoring Suppl (FREESTYLE LITE) DEVI Use to check blood sugar tid. DX E11.22 1 each 0  . carbamazepine (TEGRETOL-XR) 100 MG 12 hr tablet 1  bid 60 tablet 3  . Cholecalciferol (VITAMIN D3) 1000 UNITS CAPS Take 1,000 Units by mouth daily.    . Exenatide ER (BYDUREON) 2 MG PEN Inject the contents of one pen once per week 12 each 1  . furosemide (LASIX) 20 MG tablet TAKE 1 TABLET TWICE A DAY 180 tablet 1  . glucose blood (FREESTYLE LITE) test strip Test tid. DX E11.22 100 each 5  . hyoscyamine (LEVBID) 0.375 MG 12 hr tablet Take 1 tablet (0.375 mg total) by mouth 2 (two) times daily. 60 tablet 0  . Insulin Pen Needle (SURE COMFORT PEN NEEDLES) 32G X 4 MM MISC Use with insulin pens as directed. Dx E11.9 300 each 11  . Lancets (FREESTYLE) lancets Test tid. DX E11.22 100 each 5  . LANTUS SOLOSTAR 100  UNIT/ML Solostar Pen INJECT 38 UNITS UNDER THE SKIN TWICE A DAY 90 mL 2  . levalbuterol (XOPENEX) 1.25 MG/3ML nebulizer solution Take 1.25 mg by nebulization every 4 (four) hours as needed for wheezing. 72 mL 0  . lidocaine (LIDODERM) 5 % Place 1 patch onto the skin daily. 12 hours on 12 hours off 90 patch 5  . lidocaine (XYLOCAINE) 2 % solution USE 20 ML IN THE MOUTH OR THROAT AS NEEDED FOR MOUTH PAIN AS DIRECTED 100 mL 0  . LORazepam (ATIVAN) 1 MG tablet 1  Bid   1 prn 65 tablet 3  . LYRICA 100 MG capsule Take 100 mg by mouth 2 (two) times daily.    . magic mouthwash SOLN Take 5 mLs by mouth 4 (four) times daily. 240 mL 0  . metoprolol (LOPRESSOR) 100 MG tablet Take 1 tablet (100 mg total) by mouth 2 (two) times daily. 180 tablet 3  . metoprolol tartrate (LOPRESSOR) 25 MG tablet TAKE 1 TABLET TWICE A DAY (TO BE TAKEN ALONG WITH THE LOPRESSOR 100 MG TWICE A DAY) 180 tablet 3  . rizatriptan (MAXALT-MLT) 10 MG disintegrating tablet DISSOLVE 1 TABLET UNDER TONGUE IMMEDIATELY, MAY REPEAT DOSE IN 1 HOUR IF NEEDED- MAX OF 3 TABLETS IN 24 HOURS 10 tablet 5  . SUMAtriptan (IMITREX) 100 MG tablet Take one at onset of HA. May repeat in 2 hours if headache persists or recurs. Limit two per  24 hours 10 tablet 11  . temazepam (RESTORIL) 15 MG capsule 2  qhs 60 capsule 4  . tizanidine (ZANAFLEX) 6 MG capsule TAKE 1 CAPSULE THREE TIMES A DAY AS NEEDED FOR MUSCLE SPASMS 42 capsule 3  . TRICOR 145 MG tablet TAKE 1 TABLET DAILY 90 tablet 0  . zaleplon (SONATA) 10 MG capsule Take 1 capsule (10 mg total) by mouth at bedtime as needed for sleep. 30 capsule 0   No current facility-administered medications for this visit.     Past Medical History:  Diagnosis Date  . Atrial fibrillation ([place])   . Bipolar affective ([place])   . CHF (congestive heart failure) ([place])   . Chronic back pain   . Diabetes mellitus without complication ([place])   . Hyperlipidemia   . Hypertension   . Pain management   . Renal insufficiency       Past Surgical History:  Procedure Laterality Date  . BREAST REDUCTION SURGERY    . EYE SURGERY Right    cateracts  . SHOULDER SURGERY Right   . THIGH SURGERY      Social History   Social History  . Marital status: Married    Spouse name: N/A  . Number of children: 3  . Years of education: N/A   Occupational History  . Not on file.   Social History Main Topics  . Smoking status: Never Smoker  . Smokeless tobacco: Never Used  . Alcohol use No  . Drug use: No  . Sexual activity: Yes    Partners: Male    Birth control/ protection: Post-menopausal   Other Topics Concern  . Not on file   Social History Narrative   Lives at home with husband.      Vitals:   12/08/16 1322  BP: 106/74  Pulse: 92  SpO2: 93%  Weight: 192 lb (87.1 kg)  Height: 5\' 2"  (1.575 m)    Wt Readings from Last 3 Encounters:  12/08/16 192 lb (87.1 kg)  11/28/16 195 lb (88.5 kg)  11/11/16 194 lb (88 kg)     PHYSICAL EXAM General: NAD HEENT: Normal. Neck: No JVD, no thyromegaly. Lungs: Faint bibasilar crackles b/l. CV: Regular rate and irregular rhythm, normal S1/S2, no S3, no murmur. No pretibial or periankle edema.  No carotid bruit.   Abdomen: Soft, nontender, no distention.  Neurologic: Alert and oriented.  Psych: Normal affect. Skin: Normal. Musculoskeletal: No gross deformities.    ECG: Most recent ECG reviewed.   Labs: Lab Results  Component Value Date/Time   K 4.0 11/28/2016 03:47 PM   BUN 22 11/28/2016 03:47 PM   CREATININE 1.16 (H) 11/28/2016 03:47 PM   ALT 16 10/07/2016 11:20 AM   TSH 3.550 01/04/2015 10:28 AM   HGB 13.2 11/28/2016 03:47 PM     Lipids: Lab Results  Component Value Date/Time   LDLCALC 71 01/04/2015 10:28 AM   LDLDIRECT 62 03/17/2016 04:14 PM   CHOL 163 01/04/2015 10:28 AM   TRIG 249 (H) 01/04/2015 10:28 AM   HDL 42 01/04/2015 10:28 AM       ASSESSMENT AND PLAN:  1. Permanent atrial fibrillation: Heart rate is controlled with  metoprolol. Anticoagulated with apixaban.  2. Chronic diastolic heart failure with shortness of breath: There are faint crackles on exam. I will obtain a chest x-ray. I will increase Lasix to 40 mg twice daily for 3 days and then resume 20 mg twice daily. Blood pressures controlled.  3. Hypertension: Controlled. No changes.  4.  Hyperlipidemia: Continue Lipitor 40 mg.    Disposition: Follow up 1 year.   Kate Sable, M.D., F.A.C.C.

## 2016-12-10 ENCOUNTER — Encounter (HOSPITAL_COMMUNITY): Payer: Self-pay | Admitting: Psychiatry

## 2016-12-10 ENCOUNTER — Other Ambulatory Visit: Payer: Self-pay | Admitting: Family Medicine

## 2016-12-10 ENCOUNTER — Ambulatory Visit (INDEPENDENT_AMBULATORY_CARE_PROVIDER_SITE_OTHER): Payer: Medicare Other | Admitting: Psychiatry

## 2016-12-10 VITALS — BP 132/74 | HR 92 | Ht 62.0 in | Wt 192.0 lb

## 2016-12-10 DIAGNOSIS — Z4789 Encounter for other orthopedic aftercare: Secondary | ICD-10-CM | POA: Diagnosis not present

## 2016-12-10 DIAGNOSIS — M25511 Pain in right shoulder: Secondary | ICD-10-CM | POA: Diagnosis not present

## 2016-12-10 DIAGNOSIS — G8929 Other chronic pain: Secondary | ICD-10-CM | POA: Diagnosis not present

## 2016-12-10 DIAGNOSIS — Z818 Family history of other mental and behavioral disorders: Secondary | ICD-10-CM | POA: Diagnosis not present

## 2016-12-10 DIAGNOSIS — F3162 Bipolar disorder, current episode mixed, moderate: Secondary | ICD-10-CM | POA: Diagnosis not present

## 2016-12-10 MED ORDER — ARIPIPRAZOLE 10 MG PO TABS: ORAL_TABLET | ORAL | 3 refills | Status: DC

## 2016-12-10 MED ORDER — CARBAMAZEPINE ER 100 MG PO TB12: ORAL_TABLET | ORAL | 5 refills | Status: DC

## 2016-12-10 MED ORDER — LORAZEPAM 1 MG PO TABS: ORAL_TABLET | ORAL | 5 refills | Status: DC

## 2016-12-10 NOTE — Progress Notes (Signed)
Patient never called office back with information regarding her previous GI records. ROI scanned into EPIC.

## 2016-12-10 NOTE — Progress Notes (Signed)
Patient ID: Amber Stephenson, female   DOB: February 02, 1946, 71 y.o.   MRN: [id number] [facility] MD Progress Note  12/10/2016 2:12 PM Amber Stephenson  MRN:  [id number] Subjective:  Doing well Principal Problem: Bipolar disorder most recent episode manic Diagnosis:  Bipolar disorder most recent  Today the patient is on time. He seen with her husband. She is actually doing very well. She's in good spirits. He denies daily depression. She is not irritable. She's not manic. Patient says she is sleeping and eating well. She's got good energy she is enjoying reading fixing up her house listening to TV. She feels good about herself. Walking a lot. Her self-esteem is normal. Patient denies the use of alcohol or drugs he is no psychosis. Presently the patient is taking Tegretol on 100 mg twice a day recently had a Tegretol level that was normal with a value 6. The patient takes a relatively high dose of Abilify. She takes 30 mg each morning. He takes Ativan 1 mg twice a day she is functioning very well. She is insulin-dependent diabetes. Her last hemoglobin A1c was 7.1. Closely follow by her primary care doctor. Today reviewed the fact that she was psychiatrically hospitalized about a year ago for significant depression and suicidal ideation she made no suicide that time. She stay in the hospital almost 2 weeks. Since then the patient is doing fairly well she is very stable at this time. Past Medical History:  Past Medical History:  Diagnosis Date  . Atrial fibrillation ([place])   . Bipolar affective ([place])   . CHF (congestive heart failure) ([place])   . Chronic back pain   . Diabetes mellitus without complication ([place])   . Hyperlipidemia   . Hypertension   . Pain management   . Renal insufficiency     Past Surgical History:  Procedure Laterality Date  . BREAST REDUCTION SURGERY    . EYE SURGERY Right    cateracts  . SHOULDER SURGERY Right   . THIGH SURGERY     Family History:  Family History  Problem Relation Age  of Onset  . Diabetes Mother   . Heart disease Mother   . Heart disease Brother   . Heart disease Sister        CABG  . Mental illness Brother   . Diabetes Brother    Family Psychiatric  History:  Social History:  History  Alcohol Use No     History  Drug Use No    Social History   Social History  . Marital status: Married    Spouse name: N/A  . Number of children: 3  . Years of education: N/A   Social History Main Topics  . Smoking status: Never Smoker  . Smokeless tobacco: Never Used  . Alcohol use No  . Drug use: No  . Sexual activity: Yes    Partners: Male    Birth control/ protection: Post-menopausal   Other Topics Concern  . None   Social History Narrative   Lives at home with husband.    Additional Social History:                         Sleep: Good  Appetite:  Good  Current Medications: Current Outpatient Prescriptions  Medication Sig Dispense Refill  . apixaban (ELIQUIS) 5 MG TABS tablet Take 5 mg by mouth 2 (two) times daily.    . ARIPiprazole (ABILIFY) 10 MG tablet 3 qam 270 tablet 3  .  atorvastatin (LIPITOR) 40 MG tablet TAKE 1 TABLET DAILY 90 tablet 1  . Blood Glucose Monitoring Suppl (FREESTYLE LITE) DEVI Use to check blood sugar tid. DX E11.22 1 each 0  . carbamazepine (TEGRETOL-XR) 100 MG 12 hr tablet 1  bid 60 tablet 5  . Cholecalciferol (VITAMIN D3) 1000 UNITS CAPS Take 1,000 Units by mouth daily.    . Exenatide ER (BYDUREON) 2 MG PEN Inject the contents of one pen once per week 12 each 1  . furosemide (LASIX) 20 MG tablet TAKE 1 TABLET TWICE A DAY 180 tablet 1  . glucose blood (FREESTYLE LITE) test strip Test tid. DX E11.22 100 each 5  . hyoscyamine (LEVBID) 0.375 MG 12 hr tablet Take 1 tablet (0.375 mg total) by mouth 2 (two) times daily. 60 tablet 0  . Insulin Pen Needle (SURE COMFORT PEN NEEDLES) 32G X 4 MM MISC Use with insulin pens as directed. Dx E11.9 300 each 11  . Lancets (FREESTYLE) lancets Test tid. DX E11.22 100 each  5  . LANTUS SOLOSTAR 100 UNIT/ML Solostar Pen INJECT 38 UNITS UNDER THE SKIN TWICE A DAY 90 mL 2  . levalbuterol (XOPENEX) 1.25 MG/3ML nebulizer solution Take 1.25 mg by nebulization every 4 (four) hours as needed for wheezing. 72 mL 0  . lidocaine (LIDODERM) 5 % Place 1 patch onto the skin daily. 12 hours on 12 hours off 90 patch 5  . lidocaine (XYLOCAINE) 2 % solution USE 20 ML IN THE MOUTH OR THROAT AS NEEDED FOR MOUTH PAIN AS DIRECTED 100 mL 0  . LORazepam (ATIVAN) 1 MG tablet 1  Bid   1 prn 65 tablet 5  . LYRICA 100 MG capsule Take 100 mg by mouth 2 (two) times daily.    . magic mouthwash SOLN Take 5 mLs by mouth 4 (four) times daily. 240 mL 0  . metoprolol (LOPRESSOR) 100 MG tablet Take 1 tablet (100 mg total) by mouth 2 (two) times daily. 180 tablet 3  . metoprolol tartrate (LOPRESSOR) 25 MG tablet TAKE 1 TABLET TWICE A DAY (TO BE TAKEN ALONG WITH THE LOPRESSOR 100 MG TWICE A DAY) 180 tablet 3  . rizatriptan (MAXALT-MLT) 10 MG disintegrating tablet DISSOLVE 1 TABLET UNDER TONGUE IMMEDIATELY, MAY REPEAT DOSE IN 1 HOUR IF NEEDED- MAX OF 3 TABLETS IN 24 HOURS 10 tablet 5  . SUMAtriptan (IMITREX) 100 MG tablet Take one at onset of HA. May repeat in 2 hours if headache persists or recurs. Limit two per 24 hours 10 tablet 11  . temazepam (RESTORIL) 15 MG capsule 2  qhs 60 capsule 4  . tizanidine (ZANAFLEX) 6 MG capsule TAKE 1 CAPSULE THREE TIMES A DAY AS NEEDED FOR MUSCLE SPASMS 42 capsule 3  . TRICOR 145 MG tablet TAKE 1 TABLET DAILY 90 tablet 0  . zaleplon (SONATA) 10 MG capsule Take 1 capsule (10 mg total) by mouth at bedtime as needed for sleep. 30 capsule 0   No current facility-administered medications for this visit.     Lab Results: No results found for this or any previous visit (from the past 48 hour(s)).  Blood Alcohol level:  Lab Results  Component Value Date   ETH <5 10/01/2015    Physical Findings: AIMS:  , ,  ,  ,    CIWA:    COWS:     Musculoskeletal: Strength &  Muscle Tone: within normal limits Gait & Station: normal Patient leans: N/A  Psychiatric Specialty Exam: ROS  Blood pressure 132/74, pulse  92, height 5\' 2"  (1.575 m), weight 192 lb (87.1 kg).Body mass index is 35.12 kg/m.  General Appearance: Casual  Eye Contact::  Good  Speech:  Clear and Coherent  Volume:  Normal  Mood:  Euthymic  Affect:  Appropriate  Thought Process:  Coherent  Orientation:  Full (Time, Place, and Person)  Thought Content:  WDL  Suicidal Thoughts:  No  Homicidal Thoughts:  No  Memory:  NA  Judgement:  Good  Insight:  Good  Psychomotor Activity:  Normal  Concentration:  Good  Recall:  Good  Fund of Knowledge:Good  Language: Good  Akathisia:  No  Handed:  Right  AIMS (if indicated):     Assets: Good spirits   ADL's:  Intact  Cognition: WNL  Sleep:      Treatment Plan Summary:  The patient first problem is that of bipolar disorder mixed type we continue to clarify the diagnosis. When the patient is 8 she is very dysfunctional. At this time she is living with her husband. She takes her medicines just as prescribed. This includes Tegretol 100 mg twice a day, Ativan 1 mg twice a day 30 mg of Abilify. Today the patient had aims scale and demonstrated minimal carted dyskinesia. He only and she has his own inside will tremble that she'll see field. She's not really bothered by this time. We will continue to look at followed closely. The possibility of reducing her Abilify will be to return or months. If we reduced to do it slowly. I think her mixed bipolar disorder is well treated this time with Tegretol giving [place] a therapeutic blood level and Abilify but which are improved for mixed bipolar disorder. This patient to return to see me in 4 months.

## 2016-12-11 ENCOUNTER — Telehealth: Payer: Self-pay | Admitting: *Deleted

## 2016-12-11 NOTE — Telephone Encounter (Signed)
Notes recorded by Laurine Blazer, LPN on 7/94/8016 at 5:53 PM EDT Patient notified. Copy to pmd. ------  Notes recorded by Laurine Blazer, LPN on 7/48/2707 at 86:75 AM EDT Left message to return call.  ------  Notes recorded by Herminio Commons, MD on 12/08/2016 at 4:54 PM EDT No evidence of CHF.

## 2016-12-15 DIAGNOSIS — M25511 Pain in right shoulder: Secondary | ICD-10-CM | POA: Diagnosis not present

## 2016-12-16 DIAGNOSIS — M722 Plantar fascial fibromatosis: Secondary | ICD-10-CM | POA: Diagnosis not present

## 2016-12-16 DIAGNOSIS — M79672 Pain in left foot: Secondary | ICD-10-CM | POA: Diagnosis not present

## 2016-12-18 DIAGNOSIS — M545 Low back pain: Secondary | ICD-10-CM | POA: Diagnosis not present

## 2016-12-18 DIAGNOSIS — M62830 Muscle spasm of back: Secondary | ICD-10-CM | POA: Diagnosis not present

## 2016-12-18 DIAGNOSIS — M9903 Segmental and somatic dysfunction of lumbar region: Secondary | ICD-10-CM | POA: Diagnosis not present

## 2016-12-18 DIAGNOSIS — M47816 Spondylosis without myelopathy or radiculopathy, lumbar region: Secondary | ICD-10-CM | POA: Diagnosis not present

## 2016-12-23 DIAGNOSIS — Z4789 Encounter for other orthopedic aftercare: Secondary | ICD-10-CM | POA: Diagnosis not present

## 2016-12-23 DIAGNOSIS — R200 Anesthesia of skin: Secondary | ICD-10-CM | POA: Diagnosis not present

## 2017-01-01 DIAGNOSIS — M25511 Pain in right shoulder: Secondary | ICD-10-CM | POA: Diagnosis not present

## 2017-01-01 DIAGNOSIS — Z79899 Other long term (current) drug therapy: Secondary | ICD-10-CM | POA: Diagnosis not present

## 2017-01-01 DIAGNOSIS — G8929 Other chronic pain: Secondary | ICD-10-CM | POA: Diagnosis not present

## 2017-01-01 DIAGNOSIS — M545 Low back pain: Secondary | ICD-10-CM | POA: Diagnosis not present

## 2017-01-05 ENCOUNTER — Ambulatory Visit (INDEPENDENT_AMBULATORY_CARE_PROVIDER_SITE_OTHER): Payer: Medicare Other | Admitting: Family Medicine

## 2017-01-05 VITALS — BP 120/80 | HR 54 | Temp 97.3°F | Ht 62.0 in | Wt 192.2 lb

## 2017-01-05 DIAGNOSIS — E1122 Type 2 diabetes mellitus with diabetic chronic kidney disease: Secondary | ICD-10-CM

## 2017-01-05 DIAGNOSIS — K1379 Other lesions of oral mucosa: Secondary | ICD-10-CM

## 2017-01-05 DIAGNOSIS — I10 Essential (primary) hypertension: Secondary | ICD-10-CM

## 2017-01-05 DIAGNOSIS — N182 Chronic kidney disease, stage 2 (mild): Secondary | ICD-10-CM | POA: Diagnosis not present

## 2017-01-05 LAB — BAYER DCA HB A1C WAIVED: HB A1C (BAYER DCA - WAIVED): 8.2 % — ABNORMAL HIGH (ref <7.0)

## 2017-01-05 MED ORDER — MAGIC MOUTHWASH: 5.0000 mL | Freq: Four times a day (QID) | ORAL | 2 refills | Status: DC

## 2017-01-05 MED ORDER — PREGABALIN 150 MG PO CAPS: 150.0000 mg | ORAL_CAPSULE | Freq: Two times a day (BID) | ORAL | 5 refills | Status: DC

## 2017-01-05 NOTE — Progress Notes (Signed)
   HPI  Patient presents today here for follow-up of chronic medical conditions.  Diabetes Patient states that she has not been checking her blood sugars were watching her diet recently. Using 42 units of Lantus twice daily plus Bydureon weekly.  Hypertension Checking blood pressure at home, averages 811B systolic. No chest pain Good medication compliance.  Recurrent mouth sores patient has rarely recurrent mouth sores respond well to magic mouthwash, she would like a refill. No active lesions   PMH: Smoking status noted ROS: Per HPI  Objective: BP 120/80   Pulse (!) 54   Temp (!) 97.3 F (36.3 C) (Oral)   Ht 5\' 2"  (1.575 m)   Wt 192 lb 3.2 oz (87.2 kg)   BMI 35.15 kg/m  Gen: NAD, alert, cooperative with exam HEENT: NCAT CV: RRR, good S1/S2, no murmur Resp: CTABL, no wheezes, non-labored Ext: No edema, warm Neuro: Alert and oriented, No gross deficits  Assessment and plan:  # Type 2 diabetes A1c 8.2, control slipping We discussed endocrinology and will hold off on referral for now, Dr. Buddy Duty at [facility] if needed She will begin to watch her blood sugars, fasting and two-hour postprandial Continue current medications, improved  Neuropathy- lyrica refilled pt requesting 150 BID  # HTN Well controlled, no changes Labs up todate  # Mouth sores Likely transient thrush due to DM2, magi mouthwash refilled.     Orders Placed This Encounter  Procedures  . Bayer DCA Hb A1c Waived    Meds ordered this encounter  Medications  . magic mouthwash SOLN    Sig: Take 5 mLs by mouth 4 (four) times daily.    Dispense:  240 mL    Refill:  2  . pregabalin (LYRICA) 150 MG capsule    Sig: Take 1 capsule (150 mg total) by mouth 2 (two) times daily.    Dispense:  60 capsule    Refill:  [place], MD [facility] 01/05/2017, 5:04 PM

## 2017-01-05 NOTE — Patient Instructions (Signed)
Great to see you!  Come back in 3 months unless you need [place] sooner.   Keep track of your fasting blood sugar and 1 two hour post prandial check per day ( mix up which meal so we can identify the meal that's causing higher sugars)

## 2017-01-12 DIAGNOSIS — K582 Mixed irritable bowel syndrome: Secondary | ICD-10-CM | POA: Diagnosis not present

## 2017-01-12 DIAGNOSIS — R110 Nausea: Secondary | ICD-10-CM | POA: Diagnosis not present

## 2017-01-12 DIAGNOSIS — Z7901 Long term (current) use of anticoagulants: Secondary | ICD-10-CM | POA: Diagnosis not present

## 2017-01-12 DIAGNOSIS — R197 Diarrhea, unspecified: Secondary | ICD-10-CM | POA: Diagnosis not present

## 2017-01-14 ENCOUNTER — Other Ambulatory Visit (HOSPITAL_COMMUNITY): Payer: Self-pay

## 2017-01-14 DIAGNOSIS — M79671 Pain in right foot: Secondary | ICD-10-CM | POA: Diagnosis not present

## 2017-01-14 MED ORDER — ZALEPLON 10 MG PO CAPS: 10.0000 mg | ORAL_CAPSULE | Freq: Every evening | ORAL | 0 refills | Status: DC | PRN

## 2017-01-15 ENCOUNTER — Other Ambulatory Visit: Payer: Self-pay | Admitting: *Deleted

## 2017-01-15 MED ORDER — EXENATIDE ER 2 MG ~~LOC~~ PEN: PEN_INJECTOR | SUBCUTANEOUS | 1 refills | Status: DC

## 2017-01-18 ENCOUNTER — Other Ambulatory Visit: Payer: Self-pay | Admitting: Family Medicine

## 2017-01-19 ENCOUNTER — Telehealth: Payer: Self-pay | Admitting: Family Medicine

## 2017-01-19 NOTE — Telephone Encounter (Signed)
Please schedule

## 2017-01-20 ENCOUNTER — Other Ambulatory Visit: Payer: Self-pay | Admitting: Family Medicine

## 2017-01-20 DIAGNOSIS — Z1231 Encounter for screening mammogram for malignant neoplasm of breast: Secondary | ICD-10-CM

## 2017-01-20 NOTE — Telephone Encounter (Signed)
Previous mammo 03/06/2016 at [facility] Scheduled for 03/09/2017 at 10:15 at [facility] Letter sent with appointment date/time-CLC

## 2017-01-21 ENCOUNTER — Other Ambulatory Visit (HOSPITAL_COMMUNITY): Payer: Self-pay

## 2017-01-21 MED ORDER — TEMAZEPAM 15 MG PO CAPS: ORAL_CAPSULE | ORAL | 3 refills | Status: DC

## 2017-01-22 DIAGNOSIS — M545 Low back pain: Secondary | ICD-10-CM | POA: Diagnosis not present

## 2017-01-22 DIAGNOSIS — M9903 Segmental and somatic dysfunction of lumbar region: Secondary | ICD-10-CM | POA: Diagnosis not present

## 2017-01-22 DIAGNOSIS — M47816 Spondylosis without myelopathy or radiculopathy, lumbar region: Secondary | ICD-10-CM | POA: Diagnosis not present

## 2017-01-22 DIAGNOSIS — M62830 Muscle spasm of back: Secondary | ICD-10-CM | POA: Diagnosis not present

## 2017-01-23 DIAGNOSIS — M47816 Spondylosis without myelopathy or radiculopathy, lumbar region: Secondary | ICD-10-CM | POA: Diagnosis not present

## 2017-01-23 DIAGNOSIS — M62830 Muscle spasm of back: Secondary | ICD-10-CM | POA: Diagnosis not present

## 2017-01-23 DIAGNOSIS — M9903 Segmental and somatic dysfunction of lumbar region: Secondary | ICD-10-CM | POA: Diagnosis not present

## 2017-01-23 DIAGNOSIS — M545 Low back pain: Secondary | ICD-10-CM | POA: Diagnosis not present

## 2017-01-26 DIAGNOSIS — M25511 Pain in right shoulder: Secondary | ICD-10-CM | POA: Diagnosis not present

## 2017-01-26 DIAGNOSIS — G8929 Other chronic pain: Secondary | ICD-10-CM | POA: Diagnosis not present

## 2017-01-26 DIAGNOSIS — M545 Low back pain: Secondary | ICD-10-CM | POA: Diagnosis not present

## 2017-01-27 DIAGNOSIS — M62830 Muscle spasm of back: Secondary | ICD-10-CM | POA: Diagnosis not present

## 2017-01-27 DIAGNOSIS — M47816 Spondylosis without myelopathy or radiculopathy, lumbar region: Secondary | ICD-10-CM | POA: Diagnosis not present

## 2017-01-27 DIAGNOSIS — M9903 Segmental and somatic dysfunction of lumbar region: Secondary | ICD-10-CM | POA: Diagnosis not present

## 2017-01-27 DIAGNOSIS — M545 Low back pain: Secondary | ICD-10-CM | POA: Diagnosis not present

## 2017-01-30 ENCOUNTER — Encounter: Payer: Self-pay | Admitting: *Deleted

## 2017-01-30 ENCOUNTER — Telehealth: Payer: Self-pay | Admitting: Family Medicine

## 2017-01-30 DIAGNOSIS — D509 Iron deficiency anemia, unspecified: Secondary | ICD-10-CM | POA: Diagnosis not present

## 2017-01-30 DIAGNOSIS — N183 Chronic kidney disease, stage 3 (moderate): Secondary | ICD-10-CM | POA: Diagnosis not present

## 2017-01-30 DIAGNOSIS — E559 Vitamin D deficiency, unspecified: Secondary | ICD-10-CM | POA: Diagnosis not present

## 2017-01-30 DIAGNOSIS — I10 Essential (primary) hypertension: Secondary | ICD-10-CM | POA: Diagnosis not present

## 2017-01-30 DIAGNOSIS — R809 Proteinuria, unspecified: Secondary | ICD-10-CM | POA: Diagnosis not present

## 2017-01-30 DIAGNOSIS — I4891 Unspecified atrial fibrillation: Secondary | ICD-10-CM

## 2017-01-30 DIAGNOSIS — Z79899 Other long term (current) drug therapy: Secondary | ICD-10-CM | POA: Diagnosis not present

## 2017-01-30 NOTE — Telephone Encounter (Signed)
Please review and advise.

## 2017-01-30 NOTE — Telephone Encounter (Signed)
[place] with letter  Pt ok to come off for 48 hours and start back morning after procedure.   Laroy Apple, MD [facility] 01/30/2017, 12:49 PM

## 2017-01-30 NOTE — Addendum Note (Signed)
Addended by: Zannie Cove on: 01/30/2017 04:53 PM   Modules accepted: Orders

## 2017-02-04 ENCOUNTER — Ambulatory Visit (INDEPENDENT_AMBULATORY_CARE_PROVIDER_SITE_OTHER): Payer: Medicare Other | Admitting: Family Medicine

## 2017-02-04 ENCOUNTER — Encounter: Payer: Self-pay | Admitting: Family Medicine

## 2017-02-04 ENCOUNTER — Encounter (INDEPENDENT_AMBULATORY_CARE_PROVIDER_SITE_OTHER): Payer: Self-pay

## 2017-02-04 VITALS — BP 106/75 | HR 75 | Temp 98.6°F | Ht 62.0 in | Wt 191.2 lb

## 2017-02-04 DIAGNOSIS — N183 Chronic kidney disease, stage 3 (moderate): Secondary | ICD-10-CM | POA: Diagnosis not present

## 2017-02-04 DIAGNOSIS — K117 Disturbances of salivary secretion: Secondary | ICD-10-CM | POA: Diagnosis not present

## 2017-02-04 DIAGNOSIS — E559 Vitamin D deficiency, unspecified: Secondary | ICD-10-CM | POA: Diagnosis not present

## 2017-02-04 DIAGNOSIS — M26621 Arthralgia of right temporomandibular joint: Secondary | ICD-10-CM

## 2017-02-04 DIAGNOSIS — R809 Proteinuria, unspecified: Secondary | ICD-10-CM | POA: Diagnosis not present

## 2017-02-04 DIAGNOSIS — I10 Essential (primary) hypertension: Secondary | ICD-10-CM | POA: Diagnosis not present

## 2017-02-04 DIAGNOSIS — N179 Acute kidney failure, unspecified: Secondary | ICD-10-CM | POA: Diagnosis not present

## 2017-02-04 MED ORDER — AMOXICILLIN 500 MG PO CAPS: 500.0000 mg | ORAL_CAPSULE | Freq: Two times a day (BID) | ORAL | 0 refills | Status: DC

## 2017-02-04 NOTE — Patient Instructions (Signed)
Great to see you!  We are working on a referral, if the antibiotics clear the problem then it is ok to cancel the appointment, just try to give them advanced notice.

## 2017-02-04 NOTE — Progress Notes (Signed)
   HPI  Patient presents today here with right ear pain and increased salivation.  Ear pain Patient reports right ear pain and jaw popping at the right TMJ with chewing for about 2 weeks. She also notices that she's had tenderness over the right face and under her right jaw. She denies fever, chills, sweats. No injury Normal-appearing.  Increased elevation As been going on for maybe 6 months, certainly worsening, patient states that she drools some much that she has a wet area on her nightgown when she wakes up at night extending from side to side and proximally 6-8 inches in length. She actually has dry mouth during the day. No new medications except what is been prescribed here, Levbid was the most recent addition  PMH: Smoking status noted ROS: Per HPI  Objective: BP 106/75   Pulse 75   Temp 98.6 F (37 C) (Oral)   Ht 5\' 2"  (1.575 m)   Wt 191 lb 3.2 oz (86.7 kg)   BMI 34.97 kg/m  Gen: NAD, alert, cooperative with exam HEENT: NCAT CV: RRR, good S1/S2, no murmur Resp: CTABL, no wheezes, non-labored Ext: No edema, warm Neuro: Alert and oriented, No gross deficits  Assessment and plan:  # TMJ arthralgia Given patient's diabetes and moderate kidney disease we have chosen to avoid any NSAIDs or prednisone at this time Symptoms are moderate, ear pain is improving slowly and she mostly notices the jaw clicking at this point. With her other symptoms I recommended ENT referral  # Hypersalivation Unusual symptoms, also has tenderness of the parotid and submandibular gland on the right Given persistent symptoms over 6 months I have recommended ENT referral I do not see any medications that should be causing this.    Orders Placed This Encounter  Procedures  . Ambulatory referral to ENT    Referral Priority:   Routine    Referral Type:   Consultation    Referral Reason:   Specialty Services Required    Referred to Provider:   Rozetta Nunnery, MD    Requested  Specialty:   Otolaryngology    Number of Visits Requested:   1    Meds ordered this encounter  Medications  . amoxicillin (AMOXIL) 500 MG capsule    Sig: Take 1 capsule (500 mg total) by mouth 2 (two) times daily.    Dispense:  20 capsule    Refill:  [place], MD [facility] 02/04/2017, 5:04 PM

## 2017-02-06 ENCOUNTER — Other Ambulatory Visit: Payer: Self-pay | Admitting: Family Medicine

## 2017-02-17 ENCOUNTER — Other Ambulatory Visit (HOSPITAL_COMMUNITY): Payer: Self-pay

## 2017-02-17 MED ORDER — ZALEPLON 10 MG PO CAPS: 10.0000 mg | ORAL_CAPSULE | Freq: Every evening | ORAL | 0 refills | Status: DC | PRN

## 2017-02-19 ENCOUNTER — Telehealth: Payer: Self-pay | Admitting: Family Medicine

## 2017-02-19 MED ORDER — FLUCONAZOLE 150 MG PO TABS: ORAL_TABLET | ORAL | 0 refills | Status: DC

## 2017-02-19 NOTE — Telephone Encounter (Signed)
Pt aware rx sent into pharmacy. 

## 2017-02-19 NOTE — Telephone Encounter (Signed)
Diflucan sent for presumed yeast infection  Laroy Apple, MD [facility] 02/19/2017, 4:21 PM

## 2017-02-25 DIAGNOSIS — M545 Low back pain: Secondary | ICD-10-CM | POA: Diagnosis not present

## 2017-02-25 DIAGNOSIS — Z79899 Other long term (current) drug therapy: Secondary | ICD-10-CM | POA: Diagnosis not present

## 2017-02-25 DIAGNOSIS — G8929 Other chronic pain: Secondary | ICD-10-CM | POA: Diagnosis not present

## 2017-02-25 DIAGNOSIS — M25511 Pain in right shoulder: Secondary | ICD-10-CM | POA: Diagnosis not present

## 2017-03-09 ENCOUNTER — Encounter (HOSPITAL_COMMUNITY): Payer: Self-pay

## 2017-03-09 ENCOUNTER — Other Ambulatory Visit: Payer: Self-pay

## 2017-03-09 ENCOUNTER — Telehealth: Payer: Self-pay | Admitting: Family Medicine

## 2017-03-09 ENCOUNTER — Ambulatory Visit (HOSPITAL_COMMUNITY)
Admission: RE | Admit: 2017-03-09 | Discharge: 2017-03-09 | Disposition: A | Discharge status: Home / Self Care | Payer: Medicare Other | Source: Ambulatory Visit | Attending: Family Medicine | Admitting: Family Medicine

## 2017-03-09 DIAGNOSIS — Z1231 Encounter for screening mammogram for malignant neoplasm of breast: Secondary | ICD-10-CM

## 2017-03-09 DIAGNOSIS — N63 Unspecified lump in unspecified breast: Secondary | ICD-10-CM

## 2017-03-09 DIAGNOSIS — N6310 Unspecified lump in the right breast, unspecified quadrant: Secondary | ICD-10-CM

## 2017-03-09 NOTE — Telephone Encounter (Signed)
Is this ok to order to do you want her to be seen?

## 2017-03-09 NOTE — Telephone Encounter (Signed)
[place] with order.   Laroy Apple, MD [facility] 03/09/2017, 3:01 PM

## 2017-03-09 NOTE — Telephone Encounter (Signed)
Will you order. I attempted but would not accept my diagnosis of lump in breast or breast mass

## 2017-03-10 ENCOUNTER — Other Ambulatory Visit: Payer: Self-pay | Admitting: Family Medicine

## 2017-03-10 ENCOUNTER — Telehealth: Payer: Self-pay | Admitting: Family Medicine

## 2017-03-10 DIAGNOSIS — N63 Unspecified lump in unspecified breast: Secondary | ICD-10-CM

## 2017-03-10 NOTE — Telephone Encounter (Signed)
It appears that orders have been placed.   Will ask our radiology tech  to help with scheduling.   Laroy Apple, MD [facility] 03/10/2017, 5:52 PM

## 2017-03-19 ENCOUNTER — Encounter: Payer: Self-pay | Admitting: Family Medicine

## 2017-03-19 ENCOUNTER — Ambulatory Visit (INDEPENDENT_AMBULATORY_CARE_PROVIDER_SITE_OTHER): Payer: Medicare Other | Admitting: Family Medicine

## 2017-03-19 ENCOUNTER — Other Ambulatory Visit: Payer: Self-pay | Admitting: *Deleted

## 2017-03-19 VITALS — BP 110/73 | HR 87 | Temp 97.0°F | Ht 62.0 in | Wt 191.2 lb

## 2017-03-19 DIAGNOSIS — N182 Chronic kidney disease, stage 2 (mild): Secondary | ICD-10-CM | POA: Diagnosis not present

## 2017-03-19 DIAGNOSIS — Z23 Encounter for immunization: Secondary | ICD-10-CM

## 2017-03-19 DIAGNOSIS — M797 Fibromyalgia: Secondary | ICD-10-CM | POA: Diagnosis not present

## 2017-03-19 DIAGNOSIS — E1122 Type 2 diabetes mellitus with diabetic chronic kidney disease: Secondary | ICD-10-CM | POA: Diagnosis not present

## 2017-03-19 HISTORY — DX: Fibromyalgia: M79.7

## 2017-03-19 MED ORDER — DULAGLUTIDE 0.75 MG/0.5ML ~~LOC~~ SOAJ: 0.7500 mg | SUBCUTANEOUS | 3 refills | Status: DC

## 2017-03-19 MED ORDER — PREGABALIN 150 MG PO CAPS: 150.0000 mg | ORAL_CAPSULE | Freq: Two times a day (BID) | ORAL | 1 refills | Status: DC

## 2017-03-19 MED ORDER — TIZANIDINE HCL 6 MG PO CAPS: ORAL_CAPSULE | ORAL | 3 refills | Status: DC

## 2017-03-19 NOTE — Progress Notes (Signed)
   HPI  Patient presents today or follow-up chronic medical conditions.  Fibromyalgia Doing well with Lyrica, needs refill. Lasting 3 months supply, she uses a mail order pharmacy.  Diabetes Patient has been using bydureon, states that the needle is large and she is developing whelps from them.  Tolerating well- interetsed in Ozempic and trulicity.   PMH: Smoking status noted ROS: Per HPI  Objective: BP 110/73   Pulse 87   Temp (!) 97 F (36.1 C) (Oral)   Ht 5\' 2"  (1.575 m)   Wt 191 lb 3.2 oz (86.7 kg)   BMI 34.97 kg/m  Gen: NAD, alert, cooperative with exam HEENT: NCAT CV: RRR, good S1/S2, no murmur Resp: CTABL, no wheezes, non-labored Ext: No edema, warm Neuro: Alert and oriented, No gross deficits  Assessment and plan:  # Fibromyalgia Stable Refill Lyrica  # Type 2 diabetes Uncontrolled, changing from Bydureon to Trulicity Follow up about 1 month for repeat A1C  # Need for immunization against influenza-counseling provided for all vaccine components    Orders Placed This Encounter  Procedures  . Flu Vaccine QUAD 36+ mos IM  . Flu vaccine HIGH DOSE PF    Meds ordered this encounter  Medications  . pregabalin (LYRICA) 150 MG capsule    Sig: Take 1 capsule (150 mg total) by mouth 2 (two) times daily.    Dispense:  180 capsule    Refill:  1  . Dulaglutide (TRULICITY) 2.95 JO/8.4ZY SOPN    Sig: Inject 0.75 mg into the skin once a week.    Dispense:  12 pen    Refill:  3    Discontinue bydureon when trulicity filled    Laroy Apple, MD [facility] 03/19/2017, 11:39 AM

## 2017-03-19 NOTE — Patient Instructions (Signed)
Great to see you!  Come back in Early December to follow up for diabetes

## 2017-03-24 ENCOUNTER — Encounter (HOSPITAL_COMMUNITY): Payer: Self-pay

## 2017-03-24 ENCOUNTER — Ambulatory Visit (HOSPITAL_COMMUNITY)
Admission: RE | Admit: 2017-03-24 | Discharge: 2017-03-24 | Disposition: A | Discharge status: Home / Self Care | Payer: Medicare Other | Source: Ambulatory Visit | Attending: Family Medicine | Admitting: Family Medicine

## 2017-03-24 DIAGNOSIS — N63 Unspecified lump in unspecified breast: Secondary | ICD-10-CM | POA: Diagnosis not present

## 2017-03-24 DIAGNOSIS — R928 Other abnormal and inconclusive findings on diagnostic imaging of breast: Secondary | ICD-10-CM | POA: Diagnosis not present

## 2017-03-25 DIAGNOSIS — M545 Low back pain: Secondary | ICD-10-CM | POA: Diagnosis not present

## 2017-03-25 DIAGNOSIS — M9903 Segmental and somatic dysfunction of lumbar region: Secondary | ICD-10-CM | POA: Diagnosis not present

## 2017-03-25 DIAGNOSIS — M62830 Muscle spasm of back: Secondary | ICD-10-CM | POA: Diagnosis not present

## 2017-03-25 DIAGNOSIS — M47816 Spondylosis without myelopathy or radiculopathy, lumbar region: Secondary | ICD-10-CM | POA: Diagnosis not present

## 2017-03-26 DIAGNOSIS — M25511 Pain in right shoulder: Secondary | ICD-10-CM | POA: Diagnosis not present

## 2017-03-26 DIAGNOSIS — G8929 Other chronic pain: Secondary | ICD-10-CM | POA: Diagnosis not present

## 2017-03-26 DIAGNOSIS — M545 Low back pain: Secondary | ICD-10-CM | POA: Diagnosis not present

## 2017-03-26 DIAGNOSIS — Z79899 Other long term (current) drug therapy: Secondary | ICD-10-CM | POA: Diagnosis not present

## 2017-03-31 DIAGNOSIS — D120 Benign neoplasm of cecum: Secondary | ICD-10-CM | POA: Diagnosis not present

## 2017-03-31 DIAGNOSIS — Z1211 Encounter for screening for malignant neoplasm of colon: Secondary | ICD-10-CM | POA: Diagnosis not present

## 2017-03-31 DIAGNOSIS — K52832 Lymphocytic colitis: Secondary | ICD-10-CM | POA: Diagnosis not present

## 2017-03-31 DIAGNOSIS — K5730 Diverticulosis of large intestine without perforation or abscess without bleeding: Secondary | ICD-10-CM | POA: Diagnosis not present

## 2017-03-31 DIAGNOSIS — R197 Diarrhea, unspecified: Secondary | ICD-10-CM | POA: Diagnosis not present

## 2017-03-31 DIAGNOSIS — D122 Benign neoplasm of ascending colon: Secondary | ICD-10-CM | POA: Diagnosis not present

## 2017-03-31 DIAGNOSIS — Z800 Family history of malignant neoplasm of digestive organs: Secondary | ICD-10-CM | POA: Diagnosis not present

## 2017-04-08 ENCOUNTER — Ambulatory Visit (INDEPENDENT_AMBULATORY_CARE_PROVIDER_SITE_OTHER): Payer: Medicare Other | Admitting: Psychiatry

## 2017-04-08 DIAGNOSIS — F3162 Bipolar disorder, current episode mixed, moderate: Secondary | ICD-10-CM | POA: Diagnosis not present

## 2017-04-08 DIAGNOSIS — Z818 Family history of other mental and behavioral disorders: Secondary | ICD-10-CM

## 2017-04-08 MED ORDER — LORAZEPAM 1 MG PO TABS: ORAL_TABLET | ORAL | 5 refills | Status: DC

## 2017-04-08 MED ORDER — CARBAMAZEPINE ER 100 MG PO TB12: ORAL_TABLET | ORAL | 2 refills | Status: DC

## 2017-04-08 MED ORDER — ARIPIPRAZOLE 10 MG PO TABS: ORAL_TABLET | ORAL | 3 refills | Status: DC

## 2017-04-08 MED ORDER — TEMAZEPAM 15 MG PO CAPS: ORAL_CAPSULE | ORAL | 3 refills | Status: DC

## 2017-04-08 MED ORDER — CARBAMAZEPINE ER 100 MG PO TB12: ORAL_TABLET | ORAL | 5 refills | Status: DC

## 2017-04-08 NOTE — Progress Notes (Signed)
Patient ID: Amber Stephenson, female   DOB: 05/12/1946, 71 y.o.   MRN: [id number] [facility] MD Progress Note  04/08/2017 2:43 PM Amber Stephenson  MRN:  [id number] Subjective:  Doing well Principal Problem: Bipolar disorder most recent episode manic Diagnosis:  Bipolar disorder most recent  Today the patient is doing fairly well. She is seen alone. She seems calm and in good spirits in many ways. She seems resilient. The patient uses that her 11 year old grandson has come to live with her. This is because the patient's daughter grandsons mother's drug of using and is just gotten out of prison. The patient-year-old grandson is coming from [place]. Is now living with the patient going to high school football team. He is making fairly good adjustment. He is described as being relatively low maintenance and a good kid. The patient says her mood is even. She denies being depressed or manic. She denies being irritable. She actually is sleeping and eating well has good energy. She denies racing thoughts. The patient has a good amount of energy. She is thinking and concentrating without problems. Her mood is even. She shows no signs of grandiosity. In essence her mood disorder is stable. The patient does not drink any alcohol or use any drugs. He does not have psychosis. Past Medical History:  Past Medical History:  Diagnosis Date  . Atrial fibrillation ([place])   . Bipolar affective ([place])   . CHF (congestive heart failure) ([place])   . Chronic back pain   . Diabetes mellitus without complication ([place])   . Hyperlipidemia   . Hypertension   . Pain management   . Renal insufficiency     Past Surgical History:  Procedure Laterality Date  . BREAST REDUCTION SURGERY    . EYE SURGERY Right    cateracts  . SHOULDER SURGERY Right   . THIGH SURGERY     Family History:  Family History  Problem Relation Age of Onset  . Diabetes Mother   . Heart disease Mother   . Heart disease Brother   . Heart disease Sister    CABG  . Mental illness Brother   . Diabetes Brother    Family Psychiatric  History:  Social History:  Social History   Substance and Sexual Activity  Alcohol Use No  . Alcohol/week: 0.0 oz     Social History   Substance and Sexual Activity  Drug Use No    Social History   Socioeconomic History  . Marital status: Married    Spouse name: Not on file  . Number of children: 3  . Years of education: Not on file  . Highest education level: Not on file  Social Needs  . Financial resource strain: Not on file  . Food insecurity - worry: Not on file  . Food insecurity - inability: Not on file  . Transportation needs - medical: Not on file  . Transportation needs - non-medical: Not on file  Occupational History  . Not on file  Tobacco Use  . Smoking status: Never Smoker  . Smokeless tobacco: Never Used  Substance and Sexual Activity  . Alcohol use: No    Alcohol/week: 0.0 oz  . Drug use: No  . Sexual activity: Yes    Partners: Male    Birth control/protection: Post-menopausal  Other Topics Concern  . Not on file  Social History Narrative   Lives at home with husband.    Additional Social History:  Sleep: Good  Appetite:  Good  Current Medications: Current Outpatient Medications  Medication Sig Dispense Refill  . apixaban (ELIQUIS) 5 MG TABS tablet Take 5 mg by mouth 2 (two) times daily.    . ARIPiprazole (ABILIFY) 10 MG tablet 3 qam 270 tablet 3  . atorvastatin (LIPITOR) 40 MG tablet TAKE 1 TABLET DAILY 90 tablet 1  . Blood Glucose Monitoring Suppl (FREESTYLE LITE) DEVI Use to check blood sugar tid. DX E11.22 1 each 0  . carbamazepine (TEGRETOL-XR) 100 MG 12 hr tablet 1  bid 180 tablet 2  . Cholecalciferol (VITAMIN D3) 1000 UNITS CAPS Take 1,000 Units by mouth daily.    . Dulaglutide (TRULICITY) 9.56 OZ/3.0QM SOPN Inject 0.75 mg into the skin once a week. 12 pen 3  . Exenatide ER (BYDUREON) 2 MG PEN Inject the contents of one pen  once per week 12 each 1  . FREESTYLE LITE test strip USE FOR TESTING SUGAR THREE TIMES DAILY 100 each 3  . furosemide (LASIX) 20 MG tablet TAKE 1 TABLET TWICE A DAY 180 tablet 1  . hyoscyamine (LEVBID) 0.375 MG 12 hr tablet TAKE 1 TABLET(0.375 MG) BY MOUTH TWICE DAILY 60 tablet 5  . Insulin Pen Needle (SURE COMFORT PEN NEEDLES) 32G X 4 MM MISC Use with insulin pens as directed. Dx E11.9 300 each 11  . Lancets (FREESTYLE) lancets Test tid. DX E11.22 100 each 5  . LANTUS SOLOSTAR 100 UNIT/ML Solostar Pen INJECT 38 UNITS UNDER THE SKIN TWICE A DAY 90 mL 2  . levalbuterol (XOPENEX) 1.25 MG/3ML nebulizer solution Take 1.25 mg by nebulization every 4 (four) hours as needed for wheezing. 72 mL 0  . lidocaine (LIDODERM) 5 % Place 1 patch onto the skin daily. 12 hours on 12 hours off 90 patch 5  . lidocaine (XYLOCAINE) 2 % solution USE 20 ML IN THE MOUTH OR THROAT AS NEEDED FOR MOUTH PAIN AS DIRECTED 100 mL 0  . LORazepam (ATIVAN) 1 MG tablet 1 qam  2   qhs 90 tablet 5  . magic mouthwash SOLN Take 5 mLs by mouth 4 (four) times daily. 240 mL 2  . metoprolol (LOPRESSOR) 100 MG tablet Take 1 tablet (100 mg total) by mouth 2 (two) times daily. 180 tablet 3  . metoprolol tartrate (LOPRESSOR) 25 MG tablet TAKE 1 TABLET TWICE A DAY (TO BE TAKEN ALONG WITH THE LOPRESSOR 100 MG TWICE A DAY) 180 tablet 3  . pregabalin (LYRICA) 150 MG capsule Take 1 capsule (150 mg total) by mouth 2 (two) times daily. 180 capsule 1  . rizatriptan (MAXALT-MLT) 10 MG disintegrating tablet DISSOLVE 1 TABLET UNDER TONGUE IMMEDIATELY, MAY REPEAT DOSE IN 1 HOUR IF NEEDED- MAX OF 3 TABLETS IN 24 HOURS 10 tablet 5  . SUMAtriptan (IMITREX) 100 MG tablet TAKE 1 TABLET BY MOUTH AT ONSET OF HEADACHE. MAY REPEAT IN 2 HOURS IF HEADACHE PERSISTS OR RECURS.LIMIT 2 PER 24 HOURS 10 tablet 5  . temazepam (RESTORIL) 15 MG capsule 2  qhs 60 capsule 3  . tizanidine (ZANAFLEX) 6 MG capsule TAKE 1 CAPSULE THREE TIMES A DAY AS NEEDED FOR MUSCLE SPASMS 90  capsule 3  . TRICOR 145 MG tablet TAKE 1 TABLET DAILY 90 tablet 0  . zaleplon (SONATA) 10 MG capsule Take 1 capsule (10 mg total) by mouth at bedtime as needed for sleep. 30 capsule 0   No current facility-administered medications for this visit.     Lab Results: No results found for this or any  previous visit (from the past 48 hour(s)).  Blood Alcohol level:  Lab Results  Component Value Date   ETH <5 10/01/2015    Physical Findings: AIMS:  , ,  ,  ,    CIWA:    COWS:     Musculoskeletal: Strength & Muscle Tone: within normal limits Gait & Station: normal Patient leans: N/A  Psychiatric Specialty Exam: ROS  There were no vitals taken for this visit.There is no height or weight on file to calculate BMI.  General Appearance: Casual  Eye Contact::  Good  Speech:  Clear and Coherent  Volume:  Normal  Mood:  Euthymic  Affect:  Appropriate  Thought Process:  Coherent  Orientation:  Full (Time, Place, and Person)  Thought Content:  WDL  Suicidal Thoughts:  No  Homicidal Thoughts:  No  Memory:  NA  Judgement:  Good  Insight:  Good  Psychomotor Activity:  Normal  Concentration:  Good  Recall:  Good  Fund of Knowledge:Good  Language: Good  Akathisia:  No  Handed:  Right  AIMS (if indicated):     Assets: Good spirits   ADL's:  Intact  Cognition: WNL  Sleep:      Treatment Plan Summary: This patient is diagnosed with mixed bipolar disorder. She takes Tegretol and does very well. She has a therapeutic level taking 100 mg twice a day.she also takes high doses Abilify 30 mg every day. Today she is somewhat stressed and anxious about her grandson living with her. Today we'll go ahead and slightly increase her Ativan to taking 1 mg one in the morning and 2 at night. She was taking 1 twice a day. Overall the patient is fairly stable. She'll return to see me to work 3 months for 30 minutes with her husband. Legrand Como. This patient however hospital. She's been relatively stable.  She is a good plan. Today we spentabout 50% of the time talking about her medications and counseling.the patient is not suicidal. She is functioning actually fairly well.

## 2017-04-29 ENCOUNTER — Encounter: Payer: Self-pay | Admitting: Family Medicine

## 2017-04-29 ENCOUNTER — Ambulatory Visit (INDEPENDENT_AMBULATORY_CARE_PROVIDER_SITE_OTHER): Payer: Medicare Other | Admitting: Family Medicine

## 2017-04-29 VITALS — BP 136/88 | HR 58 | Temp 98.8°F | Ht 62.0 in | Wt 194.6 lb

## 2017-04-29 DIAGNOSIS — K52839 Microscopic colitis, unspecified: Secondary | ICD-10-CM

## 2017-04-29 DIAGNOSIS — E1122 Type 2 diabetes mellitus with diabetic chronic kidney disease: Secondary | ICD-10-CM

## 2017-04-29 DIAGNOSIS — N182 Chronic kidney disease, stage 2 (mild): Secondary | ICD-10-CM | POA: Diagnosis not present

## 2017-04-29 DIAGNOSIS — R413 Other amnesia: Secondary | ICD-10-CM

## 2017-04-29 NOTE — Patient Instructions (Addendum)
Great to see you!  We will work on scheduling an MRI, will also work on a referral to a neurologist. Please call with any concerns or worsening symptoms.  Come back in 1 month for follow-up diabetes

## 2017-04-29 NOTE — Progress Notes (Signed)
   HPI  Patient presents today here to follow-up for colonoscopy results, memory deficits, and discuss her new diabetic injection.  Patient does not know how to use her new injection of Trulicity.  Memory deficits. Patient believes she has had issues for about 3-4 months.  She reports episodes of memory lapses that lasts several hours.  These are usually on perceived and only discovered a few days later.  She describes a recent episode where she went to a restaurant that she does not like, ate dinner, and cleaned up and only discovered a few days later.  Her husband explained that she insisted on going she cannot remember the event.  This is happened in other settings as well.  Patient discusses budesonide for her microscopic colitis, she states that she is not having any diarrhea and is tolerating medication well  PMH: Smoking status noted ROS: Per HPI  Objective: BP 136/88   Pulse (!) 58   Temp 98.8 F (37.1 C) (Oral)   Ht 5\' 2"  (1.575 m)   Wt 194 lb 9.6 oz (88.3 kg)   BMI 35.59 kg/m  Gen: NAD, alert, cooperative with exam HEENT: NCAT CV: RRR, good S1/S2, no murmur Resp: CTABL, no wheezes, non-labored Ext: No edema, warm Neuro: Alert and oriented, strength 5/5 and sensation intact in all 4 extremities, cranial nerves II through XII intact  MMSE - Mini Mental State Exam 04/29/2017  Orientation to time 5  Orientation to Place 4  Registration 3  Attention/ Calculation 5  Recall 3  Language- name 2 objects 2  Language- repeat 1  Language- follow 3 step command 3  Language- read & follow direction 1  Write a sentence 1  Copy design 1  Total score 29     Assessment and plan:  #Microscopic colitis Symptoms stable, tolerating budesonide, appreciate GIs treatment  #Memory deficits, memory lapses Patient with concerning memory lapses Consider medication effect However I think stroke and other structural considered, I will pursue an MRI and also refer her to neurology  for their opinion.  #Type 2 diabetes Education for [employer] given today.     Orders Placed This Encounter  Procedures  . MR Brain Wo Contrast    Standing Status:   Future    Standing Expiration Date:   06/29/2018    Order Specific Question:   What is the patient's sedation requirement?    Answer:   No Sedation    Order Specific Question:   Does the patient have a pacemaker or implanted devices?    Answer:   No    Order Specific Question:   Preferred imaging location?    Answer:   [facility] (table limit-350lbs)    Order Specific Question:   Radiology Contrast Protocol - do NOT remove file path    Answer:   [email or web address]  . Ambulatory referral to Neurology    Referral Priority:   Routine    Referral Type:   Consultation    Referral Reason:   Specialty Services Required    Requested Specialty:   Neurology    Number of Visits Requested:   1    No orders of the defined types were placed in this encounter.   Laroy Apple, MD [facility] 04/29/2017, 3:13 PM

## 2017-04-30 ENCOUNTER — Other Ambulatory Visit: Payer: Self-pay | Admitting: Family Medicine

## 2017-04-30 ENCOUNTER — Encounter: Payer: Self-pay | Admitting: Neurology

## 2017-04-30 ENCOUNTER — Other Ambulatory Visit: Payer: Self-pay | Admitting: Cardiovascular Disease

## 2017-05-01 ENCOUNTER — Other Ambulatory Visit: Payer: Self-pay | Admitting: Family Medicine

## 2017-05-01 DIAGNOSIS — E559 Vitamin D deficiency, unspecified: Secondary | ICD-10-CM | POA: Diagnosis not present

## 2017-05-01 DIAGNOSIS — Z79899 Other long term (current) drug therapy: Secondary | ICD-10-CM | POA: Diagnosis not present

## 2017-05-01 DIAGNOSIS — R809 Proteinuria, unspecified: Secondary | ICD-10-CM | POA: Diagnosis not present

## 2017-05-01 DIAGNOSIS — I10 Essential (primary) hypertension: Secondary | ICD-10-CM | POA: Diagnosis not present

## 2017-05-01 DIAGNOSIS — D509 Iron deficiency anemia, unspecified: Secondary | ICD-10-CM | POA: Diagnosis not present

## 2017-05-01 DIAGNOSIS — M545 Low back pain: Secondary | ICD-10-CM | POA: Diagnosis not present

## 2017-05-01 DIAGNOSIS — N183 Chronic kidney disease, stage 3 (moderate): Secondary | ICD-10-CM | POA: Diagnosis not present

## 2017-05-01 NOTE — Telephone Encounter (Signed)
Last lipid 03/17/16  Dr Wendi Snipes

## 2017-05-06 ENCOUNTER — Ambulatory Visit (HOSPITAL_COMMUNITY): Payer: Medicare Other

## 2017-05-06 DIAGNOSIS — N179 Acute kidney failure, unspecified: Secondary | ICD-10-CM | POA: Diagnosis not present

## 2017-05-06 DIAGNOSIS — E559 Vitamin D deficiency, unspecified: Secondary | ICD-10-CM | POA: Diagnosis not present

## 2017-05-06 DIAGNOSIS — N183 Chronic kidney disease, stage 3 (moderate): Secondary | ICD-10-CM | POA: Diagnosis not present

## 2017-05-06 DIAGNOSIS — E669 Obesity, unspecified: Secondary | ICD-10-CM | POA: Diagnosis not present

## 2017-05-12 ENCOUNTER — Other Ambulatory Visit (HOSPITAL_COMMUNITY): Payer: Self-pay | Admitting: Psychiatry

## 2017-05-13 ENCOUNTER — Ambulatory Visit (HOSPITAL_COMMUNITY)
Admission: RE | Admit: 2017-05-13 | Discharge: 2017-05-13 | Disposition: A | Discharge status: Home / Self Care | Payer: Medicare Other | Source: Ambulatory Visit | Attending: Family Medicine | Admitting: Family Medicine

## 2017-05-13 DIAGNOSIS — R55 Syncope and collapse: Secondary | ICD-10-CM | POA: Diagnosis not present

## 2017-05-13 DIAGNOSIS — R413 Other amnesia: Secondary | ICD-10-CM | POA: Insufficient documentation

## 2017-05-18 NOTE — Progress Notes (Signed)
Subjective: CC: urinary symptoms PCP: Timmothy Euler, MD Amber Stephenson is a 70 y.o. female presenting to clinic today for:  1. Urinary symptoms Patient reports a increased urinary frequency, dysuria, mid back pain R>L, generalized weakness, CP, SOB x4 days.  Denies hematuria, fevers, chills, abdominal pain, nausea, vomiting.  She reports decreased PO intake/ appetite.  She reports that chest pain is worse with lying flat.  CP not necessarily associated with activity.  Denies cough, congestion, hemoptysis.  She reports that generalized weakness has gotten progressively worse and she feels difficult to get out of bed.  Blood pressures normally run "norrmal".  She has not taken Lasix today but has had Metoprolol 125mg , which she normally takes BID.     ROS: Per HPI  Allergies  Allergen Reactions  . Ivp Dye [Iodinated Diagnostic Agents] Swelling    Throat closes   Past Medical History:  Diagnosis Date  . Atrial fibrillation ([place])   . Bipolar affective ([place])   . CHF (congestive heart failure) ([place])   . Chronic back pain   . Diabetes mellitus without complication ([place])   . Hyperlipidemia   . Hypertension   . Pain management   . Renal insufficiency     Current Outpatient Medications:  .  apixaban (ELIQUIS) 5 MG TABS tablet, Take 5 mg by mouth 2 (two) times daily., Disp: , Rfl:  .  ARIPiprazole (ABILIFY) 10 MG tablet, 3 qam, Disp: 270 tablet, Rfl: 3 .  atorvastatin (LIPITOR) 40 MG tablet, TAKE 1 TABLET DAILY, Disp: 90 tablet, Rfl: 1 .  Blood Glucose Monitoring Suppl (FREESTYLE LITE) DEVI, Use to check blood sugar tid. DX E11.22, Disp: 1 each, Rfl: 0 .  budesonide (ENTOCORT EC) 3 MG 24 hr capsule, Take 1 capsule by mouth every morning., Disp: , Rfl:  .  carbamazepine (TEGRETOL-XR) 100 MG 12 hr tablet, 1  bid, Disp: 180 tablet, Rfl: 2 .  Cholecalciferol (VITAMIN D3) 1000 UNITS CAPS, Take 1,000 Units by mouth daily., Disp: , Rfl:  .  Dulaglutide (TRULICITY) 7.62 GB/1.5VV SOPN,  Inject 0.75 mg into the skin once a week., Disp: 12 pen, Rfl: 3 .  Exenatide ER (BYDUREON) 2 MG PEN, Inject the contents of one pen once per week, Disp: 12 each, Rfl: 1 .  FREESTYLE LITE test strip, USE FOR TESTING SUGAR THREE TIMES DAILY, Disp: 100 each, Rfl: 3 .  furosemide (LASIX) 20 MG tablet, TAKE 1 TABLET TWICE A DAY, Disp: 180 tablet, Rfl: 1 .  hyoscyamine (LEVBID) 0.375 MG 12 hr tablet, TAKE 1 TABLET(0.375 MG) BY MOUTH TWICE DAILY, Disp: 60 tablet, Rfl: 5 .  Insulin Pen Needle (SURE COMFORT PEN NEEDLES) 32G X 4 MM MISC, Use with insulin pens as directed. Dx E11.9, Disp: 300 each, Rfl: 11 .  Lancets (FREESTYLE) lancets, Test tid. DX E11.22, Disp: 100 each, Rfl: 5 .  LANTUS SOLOSTAR 100 UNIT/ML Solostar Pen, INJECT 38 UNITS UNDER THE SKIN TWICE A DAY, Disp: 90 mL, Rfl: 2 .  levalbuterol (XOPENEX) 1.25 MG/3ML nebulizer solution, Take 1.25 mg by nebulization every 4 (four) hours as needed for wheezing., Disp: 72 mL, Rfl: 0 .  lidocaine (LIDODERM) 5 %, Place 1 patch onto the skin daily. 12 hours on 12 hours off, Disp: 90 patch, Rfl: 5 .  lidocaine (XYLOCAINE) 2 % solution, USE 20 ML IN THE MOUTH OR THROAT AS NEEDED FOR MOUTH PAIN AS DIRECTED, Disp: 100 mL, Rfl: 0 .  LORazepam (ATIVAN) 1 MG tablet, 1 qam  2  qhs, Disp: 90 tablet, Rfl: 5 .  magic mouthwash SOLN, Take 5 mLs by mouth 4 (four) times daily., Disp: 240 mL, Rfl: 2 .  metoprolol tartrate (LOPRESSOR) 100 MG tablet, TAKE 1 TABLET TWICE A DAY, Disp: 180 tablet, Rfl: 3 .  metoprolol tartrate (LOPRESSOR) 25 MG tablet, TAKE 1 TABLET TWICE A DAY (TO BE TAKEN ALONG WITH THE LOPRESSOR 100 MG TWICE A DAY), Disp: 180 tablet, Rfl: 3 .  pregabalin (LYRICA) 150 MG capsule, Take 1 capsule (150 mg total) by mouth 2 (two) times daily., Disp: 180 capsule, Rfl: 1 .  rizatriptan (MAXALT-MLT) 10 MG disintegrating tablet, DISSOLVE 1 TABLET UNDER THE TONGUE IMMEDIATELY, MAY REPEAT DOSE IN 1 HOUR IF NEEDED. MAX 3 TABLETS IN 24 HOURS, Disp: 10 tablet, Rfl: 1 .   SUMAtriptan (IMITREX) 100 MG tablet, TAKE 1 TABLET BY MOUTH AT ONSET OF HEADACHE. MAY REPEAT IN 2 HOURS IF HEADACHE PERSISTS OR RECURS.LIMIT 2 PER 24 HOURS, Disp: 10 tablet, Rfl: 5 .  temazepam (RESTORIL) 15 MG capsule, 2  qhs, Disp: 60 capsule, Rfl: 3 .  tizanidine (ZANAFLEX) 6 MG capsule, TAKE 1 CAPSULE THREE TIMES A DAY AS NEEDED FOR MUSCLE SPASMS, Disp: 90 capsule, Rfl: 3 .  TRICOR 145 MG tablet, TAKE 1 TABLET DAILY, Disp: 90 tablet, Rfl: 0 Social History   Socioeconomic History  . Marital status: Married    Spouse name: Not on file  . Number of children: 3  . Years of education: Not on file  . Highest education level: Not on file  Social Needs  . Financial resource strain: Not on file  . Food insecurity - worry: Not on file  . Food insecurity - inability: Not on file  . Transportation needs - medical: Not on file  . Transportation needs - non-medical: Not on file  Occupational History  . Not on file  Tobacco Use  . Smoking status: Never Smoker  . Smokeless tobacco: Never Used  Substance and Sexual Activity  . Alcohol use: No    Alcohol/week: 0.0 oz  . Drug use: No  . Sexual activity: Yes    Partners: Male    Birth control/protection: Post-menopausal  Other Topics Concern  . Not on file  Social History Narrative   Lives at home with husband.    Family History  Problem Relation Age of Onset  . Diabetes Mother   . Heart disease Mother   . Heart disease Brother   . Heart disease Sister        CABG  . Mental illness Brother   . Diabetes Brother     Objective: Office vital signs reviewed. BP (!) 76/50   Pulse 63   Temp (!) 96.9 F (36.1 C) (Oral)   Ht 5\' 2"  (1.575 m)   Wt 197 lb (89.4 kg)   BMI 36.03 kg/m   Physical Examination:  General: Awake, alert, ill appearing, No acute distress HEENT: Normal    Neck: No JVD    Eyes: PERRLA, extraocular membranes intact, sclera white Cardio: slightly bradycardic, w/ irregularly irregular rhythm.  No friction rub  appreciated. S1S2 heard, no murmurs appreciated Pulm: clear to auscultation bilaterally, no wheezes, rhonchi or rales; normal work of breathing on room air GU: + suprapubic TTP; +Right sided CVA TTP Extremities: warm, well perfused, No edema, cyanosis or clubbing; +1 pulses bilaterally Neuro: AOx3, follows all commands.  Assessment/ Plan: 71 y.o. female   Amber Stephenson is a 71 year old female here for generalized weakness, urinary symptoms.  Her physical exam was remarkable for hypertension, right-sided CVA tenderness to palpation and suprapubic tenderness to palpation.  Unfortunately, she was unable to provide a urinary specimen.  No overt physical evidence of dehydration however, she was noted to be markedly hypotensive to 76/50.  Her blood pressure typically runs 130s over 80s.  Because of her reports of chest pain and shortness of breath and cardiac history, an EKG was obtained.  This appears relatively unchanged compared to previous but does demonstrate atrial fibrillation with nonspecific ST changes and a right bundle branch block.  There is concern for possible urosepsis versus cardiac pathology.  Past medical history significant for hypertension, atrial fibrillation, diastolic congestive heart failure, type 2 diabetes, hyperlipidemia and chronic anticoagulation.  She was sent to the nearest emergency department via EMS for further evaluation and care.  1. Dysuria Unable to provide specimen.  Did not take Lasix today.  Concern for possible cystitis vs pyelonephritis given R sided CVA TTP.  She is hypotensive.  Concern for possible urosepsis.  Discussed concern with patient who agrees to be transported to nearest ED for evaluation and management. - Urinalysis, Complete  2. Hypotension, unspecified hypotension type See above.  Did not appear especially dehydrated on today's exam.  She actually did not take her Lasix today.  Hypertension is associated with weakness and possible UTI as above.   Concern for possible urosepsis.  Would consider obtaining chest x-ray, UA w/ Cx, BCx, CBC, CMP, BG, troponins, repeat EKG to rule out MI versus pericarditis (no pericardial friction rub appreciated on today's exam but chest pain is worse with lying flat.).  Sent to the emergency department via EMS for evaluation. - EKG 12-Lead  3. Weakness Likely related to infection/ hypotension.  Ed as above  4. Chest pain, unspecified type Atypical chest pain.  More pronounced with lying down.  EKG obtained in office.  Relatively unchanged from previous. Consider rule out MI versus pericarditis (no pericardial friction rub appreciated on today's exam but chest pain is worse with lying flat.) - EKG 12-Lead  5. Shortness of breath Pulse ox normal on room air.  Normal respiratory rate.  Pulmonary exam unremarkable today. - EKG 12-Lead   Orders Placed This Encounter  Procedures  . Urinalysis, Complete  . EKG 12-Lead    [name], [facility] [phone]

## 2017-05-19 ENCOUNTER — Ambulatory Visit (INDEPENDENT_AMBULATORY_CARE_PROVIDER_SITE_OTHER): Payer: Medicare Other | Admitting: Family Medicine

## 2017-05-19 ENCOUNTER — Observation Stay (HOSPITAL_COMMUNITY)
Admission: EM | Admit: 2017-05-19 | Discharge: 2017-05-20 | Disposition: A | Discharge status: Home / Self Care | Payer: Medicare Other | Attending: Family Medicine | Admitting: Family Medicine

## 2017-05-19 ENCOUNTER — Emergency Department (HOSPITAL_COMMUNITY): Payer: Medicare Other

## 2017-05-19 ENCOUNTER — Encounter (HOSPITAL_COMMUNITY): Payer: Self-pay | Admitting: Emergency Medicine

## 2017-05-19 ENCOUNTER — Encounter: Payer: Self-pay | Admitting: Family Medicine

## 2017-05-19 VITALS — BP 76/50 | HR 63 | Temp 96.9°F | Ht 62.0 in | Wt 197.0 lb

## 2017-05-19 DIAGNOSIS — G2581 Restless legs syndrome: Secondary | ICD-10-CM | POA: Insufficient documentation

## 2017-05-19 DIAGNOSIS — I10 Essential (primary) hypertension: Secondary | ICD-10-CM | POA: Diagnosis present

## 2017-05-19 DIAGNOSIS — I130 Hypertensive heart and chronic kidney disease with heart failure and stage 1 through stage 4 chronic kidney disease, or unspecified chronic kidney disease: Secondary | ICD-10-CM | POA: Diagnosis not present

## 2017-05-19 DIAGNOSIS — B3731 Acute candidiasis of vulva and vagina: Secondary | ICD-10-CM | POA: Diagnosis present

## 2017-05-19 DIAGNOSIS — R404 Transient alteration of awareness: Secondary | ICD-10-CM | POA: Diagnosis not present

## 2017-05-19 DIAGNOSIS — E861 Hypovolemia: Secondary | ICD-10-CM | POA: Insufficient documentation

## 2017-05-19 DIAGNOSIS — I5032 Chronic diastolic (congestive) heart failure: Secondary | ICD-10-CM | POA: Diagnosis present

## 2017-05-19 DIAGNOSIS — N390 Urinary tract infection, site not specified: Secondary | ICD-10-CM | POA: Diagnosis not present

## 2017-05-19 DIAGNOSIS — Z79899 Other long term (current) drug therapy: Secondary | ICD-10-CM | POA: Diagnosis not present

## 2017-05-19 DIAGNOSIS — Z794 Long term (current) use of insulin: Secondary | ICD-10-CM | POA: Diagnosis not present

## 2017-05-19 DIAGNOSIS — A419 Sepsis, unspecified organism: Secondary | ICD-10-CM | POA: Diagnosis not present

## 2017-05-19 DIAGNOSIS — Z7901 Long term (current) use of anticoagulants: Secondary | ICD-10-CM | POA: Diagnosis not present

## 2017-05-19 DIAGNOSIS — K5090 Crohn's disease, unspecified, without complications: Secondary | ICD-10-CM | POA: Insufficient documentation

## 2017-05-19 DIAGNOSIS — I4821 Permanent atrial fibrillation: Secondary | ICD-10-CM | POA: Diagnosis present

## 2017-05-19 DIAGNOSIS — R300 Dysuria: Secondary | ICD-10-CM

## 2017-05-19 DIAGNOSIS — I5033 Acute on chronic diastolic (congestive) heart failure: Secondary | ICD-10-CM | POA: Insufficient documentation

## 2017-05-19 DIAGNOSIS — R079 Chest pain, unspecified: Secondary | ICD-10-CM

## 2017-05-19 DIAGNOSIS — F3112 Bipolar disorder, current episode manic without psychotic features, moderate: Secondary | ICD-10-CM | POA: Diagnosis not present

## 2017-05-19 DIAGNOSIS — N182 Chronic kidney disease, stage 2 (mild): Secondary | ICD-10-CM

## 2017-05-19 DIAGNOSIS — E1140 Type 2 diabetes mellitus with diabetic neuropathy, unspecified: Secondary | ICD-10-CM | POA: Diagnosis not present

## 2017-05-19 DIAGNOSIS — I482 Chronic atrial fibrillation, unspecified: Secondary | ICD-10-CM | POA: Diagnosis present

## 2017-05-19 DIAGNOSIS — E1165 Type 2 diabetes mellitus with hyperglycemia: Secondary | ICD-10-CM | POA: Diagnosis present

## 2017-05-19 DIAGNOSIS — I959 Hypotension, unspecified: Secondary | ICD-10-CM | POA: Diagnosis present

## 2017-05-19 DIAGNOSIS — E785 Hyperlipidemia, unspecified: Secondary | ICD-10-CM | POA: Diagnosis not present

## 2017-05-19 DIAGNOSIS — I700 Atherosclerosis of aorta: Secondary | ICD-10-CM | POA: Diagnosis not present

## 2017-05-19 DIAGNOSIS — B373 Candidiasis of vulva and vagina: Secondary | ICD-10-CM | POA: Diagnosis not present

## 2017-05-19 DIAGNOSIS — F319 Bipolar disorder, unspecified: Secondary | ICD-10-CM | POA: Diagnosis not present

## 2017-05-19 DIAGNOSIS — E860 Dehydration: Secondary | ICD-10-CM | POA: Insufficient documentation

## 2017-05-19 DIAGNOSIS — R0602 Shortness of breath: Secondary | ICD-10-CM | POA: Diagnosis not present

## 2017-05-19 DIAGNOSIS — M797 Fibromyalgia: Secondary | ICD-10-CM | POA: Insufficient documentation

## 2017-05-19 DIAGNOSIS — I4891 Unspecified atrial fibrillation: Secondary | ICD-10-CM | POA: Diagnosis not present

## 2017-05-19 DIAGNOSIS — N2889 Other specified disorders of kidney and ureter: Secondary | ICD-10-CM | POA: Diagnosis not present

## 2017-05-19 DIAGNOSIS — R531 Weakness: Secondary | ICD-10-CM | POA: Diagnosis not present

## 2017-05-19 DIAGNOSIS — K52839 Microscopic colitis, unspecified: Secondary | ICD-10-CM | POA: Diagnosis not present

## 2017-05-19 DIAGNOSIS — R000 Tachycardia, unspecified: Secondary | ICD-10-CM | POA: Diagnosis not present

## 2017-05-19 DIAGNOSIS — N183 Chronic kidney disease, stage 3 (moderate): Secondary | ICD-10-CM | POA: Diagnosis not present

## 2017-05-19 DIAGNOSIS — E1122 Type 2 diabetes mellitus with diabetic chronic kidney disease: Secondary | ICD-10-CM

## 2017-05-19 DIAGNOSIS — E119 Type 2 diabetes mellitus without complications: Secondary | ICD-10-CM | POA: Diagnosis present

## 2017-05-19 DIAGNOSIS — E1159 Type 2 diabetes mellitus with other circulatory complications: Secondary | ICD-10-CM | POA: Diagnosis present

## 2017-05-19 HISTORY — DX: Unspecified atrial fibrillation: I48.91

## 2017-05-19 LAB — URINALYSIS, ROUTINE W REFLEX MICROSCOPIC
Bilirubin Urine: NEGATIVE
Glucose, UA: 50 mg/dL — AB
Hgb urine dipstick: NEGATIVE
Ketones, ur: NEGATIVE mg/dL
Leukocytes, UA: NEGATIVE
Nitrite: NEGATIVE
Protein, ur: NEGATIVE mg/dL
Specific Gravity, Urine: 1.005 (ref 1.005–1.030)
pH: 7 (ref 5.0–8.0)

## 2017-05-19 LAB — COMPREHENSIVE METABOLIC PANEL
ALT: 24 U/L (ref 14–54)
AST: 28 U/L (ref 15–41)
Albumin: 3.2 g/dL — ABNORMAL LOW (ref 3.5–5.0)
Alkaline Phosphatase: 102 U/L (ref 38–126)
Anion gap: 9 (ref 5–15)
BUN: 15 mg/dL (ref 6–20)
CO2: 25 mmol/L (ref 22–32)
Calcium: 9.2 mg/dL (ref 8.9–10.3)
Chloride: 98 mmol/L — ABNORMAL LOW (ref 101–111)
Creatinine, Ser: 1.06 mg/dL — ABNORMAL HIGH (ref 0.44–1.00)
GFR calc Af Amer: >60 mL/min (ref >60)
GFR calc non Af Amer: 52 mL/min — ABNORMAL LOW (ref >60)
Glucose, Bld: 228 mg/dL — ABNORMAL HIGH (ref 65–99)
Potassium: 4.1 mmol/L (ref 3.5–5.1)
Sodium: 132 mmol/L — ABNORMAL LOW (ref 135–145)
Total Bilirubin: 1.2 mg/dL (ref 0.3–1.2)
Total Protein: 6.1 g/dL — ABNORMAL LOW (ref 6.5–8.1)

## 2017-05-19 LAB — CBC WITH DIFFERENTIAL/PLATELET
Basophils Absolute: 0 10*3/uL (ref 0.0–0.1)
Basophils Relative: 0 %
Eosinophils Absolute: 0.1 10*3/uL (ref 0.0–0.7)
Eosinophils Relative: 1 %
HCT: 38.5 % (ref 36.0–46.0)
Hemoglobin: 13 g/dL (ref 12.0–15.0)
Lymphocytes Relative: 27 %
Lymphs Abs: 2.7 10*3/uL (ref 0.7–4.0)
MCH: 29.7 pg (ref 26.0–34.0)
MCHC: 33.8 g/dL (ref 30.0–36.0)
MCV: 87.9 fL (ref 78.0–100.0)
Monocytes Absolute: 0.7 10*3/uL (ref 0.1–1.0)
Monocytes Relative: 7 %
Neutro Abs: 6.4 10*3/uL (ref 1.7–7.7)
Neutrophils Relative %: 65 %
Platelets: 186 10*3/uL (ref 150–400)
RBC: 4.38 MIL/uL (ref 3.87–5.11)
RDW: 13.4 % (ref 11.5–15.5)
WBC: 10 10*3/uL (ref 4.0–10.5)

## 2017-05-19 LAB — I-STAT CHEM 8, ED
BUN: 14 mg/dL (ref 6–20)
Calcium, Ion: 1.21 mmol/L (ref 1.15–1.40)
Chloride: 98 mmol/L — ABNORMAL LOW (ref 101–111)
Creatinine, Ser: 1 mg/dL (ref 0.44–1.00)
Glucose, Bld: 232 mg/dL — ABNORMAL HIGH (ref 65–99)
HCT: 40 % (ref 36.0–46.0)
Hemoglobin: 13.6 g/dL (ref 12.0–15.0)
Potassium: 3.5 mmol/L (ref 3.5–5.1)
Sodium: 137 mmol/L (ref 135–145)
TCO2: 26 mmol/L (ref 22–32)

## 2017-05-19 LAB — PROTIME-INR
INR: 1.4
Prothrombin Time: 17.1 seconds — ABNORMAL HIGH (ref 11.4–15.2)

## 2017-05-19 LAB — CBG MONITORING, ED: Glucose-Capillary: 176 mg/dL — ABNORMAL HIGH (ref 65–99)

## 2017-05-19 LAB — I-STAT TROPONIN, ED: Troponin i, poc: 0.01 ng/mL (ref 0.00–0.08)

## 2017-05-19 LAB — I-STAT CG4 LACTIC ACID, ED: Lactic Acid, Venous: 1.56 mmol/L (ref 0.5–1.9)

## 2017-05-19 LAB — TSH: TSH: 1.922 u[IU]/mL (ref 0.350–4.500)

## 2017-05-19 MED ORDER — INSULIN ASPART 100 UNIT/ML ~~LOC~~ SOLN: 0.0000 [IU] | Freq: Every day | SUBCUTANEOUS | Status: DC

## 2017-05-19 MED ORDER — PREGABALIN 75 MG PO CAPS
150.0000 mg | ORAL_CAPSULE | Freq: Two times a day (BID) | ORAL | Status: DC
  Administered 2017-05-19 – 2017-05-20 (×2): 150 mg via ORAL
  Filled 2017-05-19 (×2): qty 2

## 2017-05-19 MED ORDER — DILTIAZEM HCL-DEXTROSE 100-5 MG/100ML-% IV SOLN (PREMIX)
5.0000 mg/h | INTRAVENOUS | Status: DC
  Filled 2017-05-19: qty 100

## 2017-05-19 MED ORDER — LORAZEPAM 1 MG PO TABS
2.0000 mg | ORAL_TABLET | Freq: Every day | ORAL | Status: DC
  Administered 2017-05-19: 2 mg via ORAL
  Filled 2017-05-19: qty 2

## 2017-05-19 MED ORDER — LEVALBUTEROL HCL 1.25 MG/0.5ML IN NEBU
1.2500 mg | INHALATION_SOLUTION | RESPIRATORY_TRACT | Status: DC | PRN
  Administered 2017-05-20: 1.25 mg via RESPIRATORY_TRACT
  Filled 2017-05-19: qty 0.5

## 2017-05-19 MED ORDER — ASPIRIN 81 MG PO CHEW
324.0000 mg | CHEWABLE_TABLET | Freq: Once | ORAL | Status: AC
  Administered 2017-05-19: 324 mg via ORAL
  Filled 2017-05-19: qty 4

## 2017-05-19 MED ORDER — CARBAMAZEPINE ER 100 MG PO TB12
100.0000 mg | ORAL_TABLET | Freq: Two times a day (BID) | ORAL | Status: DC
  Administered 2017-05-19 – 2017-05-20 (×2): 100 mg via ORAL
  Filled 2017-05-19 (×3): qty 1

## 2017-05-19 MED ORDER — PIPERACILLIN-TAZOBACTAM 3.375 G IVPB: 3.3750 g | Freq: Three times a day (TID) | INTRAVENOUS | Status: DC

## 2017-05-19 MED ORDER — OXYCODONE-ACETAMINOPHEN 7.5-325 MG PO TABS
1.0000 | ORAL_TABLET | Freq: Four times a day (QID) | ORAL | Status: DC | PRN
  Administered 2017-05-19 – 2017-05-20 (×2): 1 via ORAL
  Filled 2017-05-19 (×2): qty 1

## 2017-05-19 MED ORDER — HYOSCYAMINE SULFATE ER 0.375 MG PO TB12
0.3750 mg | ORAL_TABLET | Freq: Two times a day (BID) | ORAL | Status: DC
  Administered 2017-05-19 – 2017-05-20 (×2): 0.375 mg via ORAL
  Filled 2017-05-19 (×3): qty 1

## 2017-05-19 MED ORDER — SUMATRIPTAN SUCCINATE 100 MG PO TABS
100.0000 mg | ORAL_TABLET | Freq: Two times a day (BID) | ORAL | Status: AC | PRN
  Administered 2017-05-19 – 2017-05-20 (×2): 100 mg via ORAL
  Filled 2017-05-19 (×3): qty 1

## 2017-05-19 MED ORDER — TEMAZEPAM 15 MG PO CAPS
30.0000 mg | ORAL_CAPSULE | Freq: Every day | ORAL | Status: DC
  Administered 2017-05-19: 30 mg via ORAL
  Filled 2017-05-19: qty 2

## 2017-05-19 MED ORDER — VITAMIN D 1000 UNITS PO TABS
1000.0000 [IU] | ORAL_TABLET | Freq: Every day | ORAL | Status: DC
  Administered 2017-05-20: 1000 [IU] via ORAL
  Filled 2017-05-19: qty 1

## 2017-05-19 MED ORDER — LORAZEPAM 1 MG PO TABS: 1.0000 mg | ORAL_TABLET | ORAL | Status: DC

## 2017-05-19 MED ORDER — ONDANSETRON HCL 4 MG/2ML IJ SOLN: 4.0000 mg | Freq: Four times a day (QID) | INTRAMUSCULAR | Status: DC | PRN

## 2017-05-19 MED ORDER — INSULIN ASPART 100 UNIT/ML ~~LOC~~ SOLN
0.0000 [IU] | Freq: Three times a day (TID) | SUBCUTANEOUS | Status: DC
  Administered 2017-05-20 (×2): 2 [IU] via SUBCUTANEOUS

## 2017-05-19 MED ORDER — BUDESONIDE 3 MG PO CPEP
9.0000 mg | ORAL_CAPSULE | Freq: Every morning | ORAL | Status: DC
Start: 2017-05-20 — End: 2017-05-20
  Administered 2017-05-20: 9 mg via ORAL
  Filled 2017-05-19: qty 3

## 2017-05-19 MED ORDER — APIXABAN 5 MG PO TABS
5.0000 mg | ORAL_TABLET | Freq: Two times a day (BID) | ORAL | Status: DC
  Administered 2017-05-19 – 2017-05-20 (×2): 5 mg via ORAL
  Filled 2017-05-19 (×2): qty 1

## 2017-05-19 MED ORDER — FENOFIBRATE 160 MG PO TABS
160.0000 mg | ORAL_TABLET | Freq: Every day | ORAL | Status: DC
  Administered 2017-05-20: 160 mg via ORAL
  Filled 2017-05-19: qty 1

## 2017-05-19 MED ORDER — LORAZEPAM 1 MG PO TABS
1.0000 mg | ORAL_TABLET | Freq: Every day | ORAL | Status: DC
  Administered 2017-05-20: 1 mg via ORAL
  Filled 2017-05-19: qty 1

## 2017-05-19 MED ORDER — BARIUM SULFATE 2.1 % PO SUSP
ORAL | Status: AC
  Filled 2017-05-19: qty 2

## 2017-05-19 MED ORDER — SODIUM CHLORIDE 0.9 % IV BOLUS (SEPSIS)
1000.0000 mL | Freq: Once | INTRAVENOUS | Status: AC
  Administered 2017-05-19: 1000 mL via INTRAVENOUS

## 2017-05-19 MED ORDER — ACETAMINOPHEN 325 MG PO TABS: 650.0000 mg | ORAL_TABLET | ORAL | Status: DC | PRN

## 2017-05-19 MED ORDER — ARIPIPRAZOLE 10 MG PO TABS
30.0000 mg | ORAL_TABLET | Freq: Every morning | ORAL | Status: DC
Start: 2017-05-20 — End: 2017-05-20
  Administered 2017-05-20: 30 mg via ORAL
  Filled 2017-05-19: qty 3

## 2017-05-19 MED ORDER — VANCOMYCIN HCL IN DEXTROSE 750-5 MG/150ML-% IV SOLN
750.0000 mg | Freq: Two times a day (BID) | INTRAVENOUS | Status: DC
  Filled 2017-05-19: qty 150

## 2017-05-19 MED ORDER — PIPERACILLIN-TAZOBACTAM 3.375 G IVPB 30 MIN
3.3750 g | Freq: Once | INTRAVENOUS | Status: AC
  Administered 2017-05-19: 3.375 g via INTRAVENOUS
  Filled 2017-05-19: qty 50

## 2017-05-19 MED ORDER — SODIUM CHLORIDE 0.9 % IV BOLUS (SEPSIS)
1000.0000 mL | Freq: Once | INTRAVENOUS | Status: DC
  Administered 2017-05-19: 1000 mL via INTRAVENOUS

## 2017-05-19 MED ORDER — ATORVASTATIN CALCIUM 40 MG PO TABS: 40.0000 mg | ORAL_TABLET | Freq: Every day | ORAL | Status: DC

## 2017-05-19 MED ORDER — DILTIAZEM HCL-DEXTROSE 100-5 MG/100ML-% IV SOLN (PREMIX)
5.0000 mg/h | INTRAVENOUS | Status: DC
  Administered 2017-05-19: 5 mg/h via INTRAVENOUS
  Administered 2017-05-20 (×2): 15 mg/h via INTRAVENOUS
  Filled 2017-05-19 (×2): qty 100

## 2017-05-19 MED ORDER — VITAMIN D3 25 MCG (1000 UT) PO CAPS: 1000.0000 [IU] | ORAL_CAPSULE | Freq: Every day | ORAL | Status: DC

## 2017-05-19 MED ORDER — DILTIAZEM LOAD VIA INFUSION
5.0000 mg | Freq: Once | INTRAVENOUS | Status: AC
  Administered 2017-05-19: 5 mg via INTRAVENOUS
  Filled 2017-05-19: qty 5

## 2017-05-19 MED ORDER — VANCOMYCIN HCL IN DEXTROSE 1-5 GM/200ML-% IV SOLN
1000.0000 mg | Freq: Once | INTRAVENOUS | Status: DC
  Filled 2017-05-19: qty 200

## 2017-05-19 MED ORDER — TIZANIDINE HCL 6 MG PO CAPS: 6.0000 mg | ORAL_CAPSULE | Freq: Three times a day (TID) | ORAL | Status: DC | PRN

## 2017-05-19 MED ORDER — VANCOMYCIN HCL 10 G IV SOLR
1500.0000 mg | Freq: Once | INTRAVENOUS | Status: AC
  Administered 2017-05-19: 1500 mg via INTRAVENOUS
  Filled 2017-05-19: qty 1500

## 2017-05-19 MED ORDER — TIZANIDINE HCL 4 MG PO TABS
6.0000 mg | ORAL_TABLET | Freq: Three times a day (TID) | ORAL | Status: DC | PRN
  Filled 2017-05-19: qty 1

## 2017-05-19 MED ORDER — INSULIN GLARGINE 100 UNIT/ML ~~LOC~~ SOLN
45.0000 [IU] | Freq: Two times a day (BID) | SUBCUTANEOUS | Status: DC
  Administered 2017-05-19 – 2017-05-20 (×2): 45 [IU] via SUBCUTANEOUS
  Filled 2017-05-19 (×3): qty 0.45

## 2017-05-19 MED ORDER — LEVALBUTEROL HCL 1.25 MG/3ML IN NEBU: 1.2500 mg | INHALATION_SOLUTION | RESPIRATORY_TRACT | Status: DC | PRN

## 2017-05-19 MED ORDER — METOPROLOL TARTRATE 50 MG PO TABS
100.0000 mg | ORAL_TABLET | Freq: Two times a day (BID) | ORAL | Status: DC
  Administered 2017-05-20: 100 mg via ORAL
  Filled 2017-05-19: qty 2

## 2017-05-19 NOTE — ED Provider Notes (Signed)
[facility] EMERGENCY DEPARTMENT Provider Note   CSN: [id number] Arrival date & time: 05/19/17  1246     History   Chief Complaint Chief Complaint  Patient presents with  . Hypotension    HPI Amber Stephenson is a 71 y.o. female.  HPI 71 year old female with past medical history of A. fib, bipolar disorder, CHF, hypertension, hyperlipidemia, who presents with generalized fatigue.  History is somewhat limited as the patient reports feeling generally fatigued and is unable to really provide much history.  However, she notes that over the last several days, she has had a very poor appetite.  She was recently underwent a colonoscopy and was told she had a moderate Crohn's flare and was started on budesonide.  Since then, she has had poor appetite.  She has not been eating and drinking much.  She has had occasional nausea with associated burning chest pain.  She has also had extreme lightheadedness and dyspnea with exertion.  She has had palpitations.  She went to her primary care doctor and was noted to be severely hypotensive and tachycardic.  She was subsequently sent to the ED.  Currently, she denies any ongoing chest pain.  Denies any other complaints.  Denies any fevers.  Of note, however, she does states she has had some lower abdominal pressure and pain as well as vaginal itching.  Past Medical History:  Diagnosis Date  . Atrial fibrillation ([place])   . Bipolar affective ([place])   . CHF (congestive heart failure) ([place])   . Chronic back pain   . Diabetes mellitus without complication ([place])   . Hyperlipidemia   . Hypertension   . Pain management   . Renal insufficiency     Patient Active Problem List   Diagnosis Date Noted  . Fibromyalgia 03/19/2017  . Healthcare maintenance 02/12/2016  . Migraine headache with aura 02/12/2016  . Diabetic neuropathy ([place]) 02/06/2016  . Depression   . Herpes genitalis in women 07/16/2015  . Chronic anticoagulation 05/30/2015    . Family history of coronary artery disease in sister 05/30/2015  . Acute on chronic diastolic CHF (congestive heart failure), NYHA class 1 ([place]) 05/30/2015  . Chest pain with moderate risk of acute coronary syndrome 05/29/2015  . Essential hypertension   . RLS (restless legs syndrome) 04/27/2015  . Lipoma 02/08/2015  . Bipolar disorder ([place]) 01/23/2015  . Insomnia 01/23/2015  . CKD stage 3 due to type 2 diabetes mellitus ([place]) 01/05/2015  . Chronic back pain 01/04/2015  . Permanent atrial fibrillation ([place]) 01/04/2015  . Diabetes mellitus with stage 2 chronic kidney disease ([place])   . Hyperlipidemia     Past Surgical History:  Procedure Laterality Date  . BREAST REDUCTION SURGERY    . EYE SURGERY Right    cateracts  . SHOULDER SURGERY Right   . THIGH SURGERY      OB History    No data available       Home Medications    Prior to Admission medications   Medication Sig Start Date End Date Taking? Authorizing Provider  apixaban (ELIQUIS) 5 MG TABS tablet Take 5 mg by mouth 2 (two) times daily.    [provider]  ARIPiprazole (ABILIFY) 10 MG tablet 3 qam 04/08/17   Plovsky, Berneta Sages, MD  atorvastatin (LIPITOR) 40 MG tablet TAKE 1 TABLET DAILY 11/19/16   Timmothy Euler, MD  Blood Glucose Monitoring Suppl (FREESTYLE LITE) DEVI Use to check blood sugar tid. DX E11.22 04/04/15   Kenn File  L, MD  budesonide (ENTOCORT EC) 3 MG 24 hr capsule Take 1 capsule by mouth every morning. 04/01/17   [provider]  carbamazepine (TEGRETOL-XR) 100 MG 12 hr tablet 1  bid 04/08/17   Plovsky, Berneta Sages, MD  Cholecalciferol (VITAMIN D3) 1000 UNITS CAPS Take 1,000 Units by mouth daily.    [provider]  Dulaglutide (TRULICITY) 3.81 WE/9.9BZ SOPN Inject 0.75 mg into the skin once a week. 03/19/17   Timmothy Euler, MD  Exenatide ER (BYDUREON) 2 MG PEN Inject the contents of one pen once per week 01/15/17   Timmothy Euler, MD  FREESTYLE LITE test strip USE FOR  TESTING SUGAR THREE TIMES DAILY 01/19/17   Timmothy Euler, MD  furosemide (LASIX) 20 MG tablet TAKE 1 TABLET TWICE A DAY 12/02/16   Timmothy Euler, MD  hyoscyamine (LEVBID) 0.375 MG 12 hr tablet TAKE 1 TABLET(0.375 MG) BY MOUTH TWICE DAILY 12/12/16   Timmothy Euler, MD  Insulin Pen Needle (SURE COMFORT PEN NEEDLES) 32G X 4 MM MISC Use with insulin pens as directed. Dx E11.9 08/16/15   Timmothy Euler, MD  Lancets (FREESTYLE) lancets Test tid. DX E11.22 04/04/15   Timmothy Euler, MD  LANTUS SOLOSTAR 100 UNIT/ML Solostar Pen INJECT 38 UNITS UNDER THE SKIN TWICE A DAY 06/12/16   Elayne Snare, MD  levalbuterol Penne Lash) 1.25 MG/3ML nebulizer solution Take 1.25 mg by nebulization every 4 (four) hours as needed for wheezing. 07/18/16   Timmothy Euler, MD  lidocaine (LIDODERM) 5 % Place 1 patch onto the skin daily. 12 hours on 12 hours off 10/12/15   Timmothy Euler, MD  lidocaine (XYLOCAINE) 2 % solution USE 20 ML IN THE MOUTH OR THROAT AS NEEDED FOR MOUTH PAIN AS DIRECTED 10/22/16   Timmothy Euler, MD  LORazepam (ATIVAN) 1 MG tablet 1 qam  2   qhs 04/08/17   Plovsky, Berneta Sages, MD  magic mouthwash SOLN Take 5 mLs by mouth 4 (four) times daily. 01/05/17   Timmothy Euler, MD  metoprolol tartrate (LOPRESSOR) 100 MG tablet TAKE 1 TABLET TWICE A DAY 05/01/17   Herminio Commons, MD  metoprolol tartrate (LOPRESSOR) 25 MG tablet TAKE 1 TABLET TWICE A DAY (TO BE TAKEN ALONG WITH THE LOPRESSOR 100 MG TWICE A DAY) 11/18/16   Herminio Commons, MD  pregabalin (LYRICA) 150 MG capsule Take 1 capsule (150 mg total) by mouth 2 (two) times daily. 03/19/17   Timmothy Euler, MD  rizatriptan (MAXALT-MLT) 10 MG disintegrating tablet DISSOLVE 1 TABLET UNDER THE TONGUE IMMEDIATELY, MAY REPEAT DOSE IN 1 HOUR IF NEEDED. MAX 3 TABLETS IN 24 HOURS 05/01/17   Timmothy Euler, MD  SUMAtriptan (IMITREX) 100 MG tablet TAKE 1 TABLET BY MOUTH AT ONSET OF HEADACHE. MAY REPEAT IN 2 HOURS IF HEADACHE PERSISTS OR  RECURS.LIMIT 2 PER 24 HOURS 02/06/17   Timmothy Euler, MD  temazepam (RESTORIL) 15 MG capsule 2  qhs 04/08/17   Plovsky, Berneta Sages, MD  tizanidine (ZANAFLEX) 6 MG capsule TAKE 1 CAPSULE THREE TIMES A DAY AS NEEDED FOR MUSCLE SPASMS 03/19/17   Timmothy Euler, MD  TRICOR 145 MG tablet TAKE 1 TABLET DAILY 05/01/17   Terald Sleeper, PA-C    Family History Family History  Problem Relation Age of Onset  . Diabetes Mother   . Heart disease Mother   . Heart disease Brother   . Heart disease Sister        CABG  .  Mental illness Brother   . Diabetes Brother     Social History Social History   Tobacco Use  . Smoking status: Never Smoker  . Smokeless tobacco: Never Used  Substance Use Topics  . Alcohol use: No    Alcohol/week: 0.0 oz  . Drug use: No     Allergies   Ivp dye [iodinated diagnostic agents]   Review of Systems Review of Systems  Constitutional: Positive for fatigue.  Respiratory: Positive for cough and shortness of breath.   Neurological: Positive for dizziness (With standing) and weakness.  All other systems reviewed and are negative.    Physical Exam Updated Vital Signs BP (!) 118/102   Pulse 87   Temp 98.2 F (36.8 C) (Oral)   Resp (!) 24   Ht 5\' 2"  (1.575 m)   Wt 86.2 kg (190 lb)   SpO2 98%   BMI 34.75 kg/m   Physical Exam  Constitutional: She is oriented to person, place, and time. She appears well-developed and well-nourished. She appears distressed.  Fatigued appearing  HENT:  Head: Normocephalic and atraumatic.  Markedly dry mucous membranes  Eyes: Conjunctivae are normal.  Neck: Neck supple.  Cardiovascular: Normal heart sounds. Exam reveals no friction rub.  No murmur heard. Tachycardic.  Irregularly irregular.  Pulmonary/Chest: Effort normal and breath sounds normal. No respiratory distress. She has no wheezes. She has no rales.  Abdominal: Soft. She exhibits no distension.  Moderate lower abdominal tenderness bilaterally.    Genitourinary:  Genitourinary Comments: Mild candidal skin infection/intertrigo.  No vaginal discharge.  Musculoskeletal: She exhibits no edema.  Neurological: She is alert and oriented to person, place, and time. She exhibits normal muscle tone.  Skin: Skin is warm. Capillary refill takes less than 2 seconds.  Psychiatric: She has a normal mood and affect.  Nursing note and vitals reviewed.    ED Treatments / Results  Labs (all labs ordered are listed, but only abnormal results are displayed) Labs Reviewed  COMPREHENSIVE METABOLIC PANEL - Abnormal; Notable for the following components:      Result Value   Sodium 132 (*)    Chloride 98 (*)    Glucose, Bld 228 (*)    Creatinine, Ser 1.06 (*)    Total Protein 6.1 (*)    Albumin 3.2 (*)    GFR calc non Af Amer 52 (*)    All other components within normal limits  PROTIME-INR - Abnormal; Notable for the following components:   Prothrombin Time 17.1 (*)    All other components within normal limits  URINALYSIS, ROUTINE W REFLEX MICROSCOPIC - Abnormal; Notable for the following components:   Glucose, UA 50 (*)    All other components within normal limits  I-STAT CHEM 8, ED - Abnormal; Notable for the following components:   Chloride 98 (*)    Glucose, Bld 232 (*)    All other components within normal limits  CULTURE, BLOOD (ROUTINE X 2)  CULTURE, BLOOD (ROUTINE X 2)  URINE CULTURE  CBC WITH DIFFERENTIAL/PLATELET  TSH  TROPONIN I  I-STAT CG4 LACTIC ACID, ED  I-STAT CG4 LACTIC ACID, ED  I-STAT TROPONIN, ED  I-STAT CG4 LACTIC ACID, ED    EKG  EKG Interpretation  Date/Time:  Tuesday May 19 2017 12:50:32 EST Ventricular Rate:  128 PR Interval:    QRS Duration: 67 QT Interval:  350 QTC Calculation: 511 R Axis:   -14 Text Interpretation:  Atrial fibrillation Abnormal R-wave progression, early transition Prolonged QT interval Baseline wander in  lead(s) V2 Since last tracing, rate has increased Otherwise no significant  change Confirmed by Duffy Bruce [phone]) on 05/19/2017 3:06:51 PM       Radiology Dg Chest Port 1 View  Result Date: 05/19/2017 CLINICAL DATA:  Sepsis. EXAM: PORTABLE CHEST 1 VIEW COMPARISON:  12/08/2016 FINDINGS: Telemetry leads overlie the chest. The cardiac silhouette is mildly enlarged. There is mild elevation of the right hemidiaphragm. No confluent airspace opacity, edema, sizable pleural effusion, or pneumothorax is identified. No acute osseous abnormality is seen. IMPRESSION: No active disease. Electronically Signed   By: Logan Bores M.D.   On: 05/19/2017 14:24    Procedures .Critical Care Performed by: Duffy Bruce, MD Authorized by: Duffy Bruce, MD   Critical care provider statement:    Critical care time (minutes):  35   Critical care time was exclusive of:  Separately billable procedures and treating other patients and teaching time   Critical care was necessary to treat or prevent imminent or life-threatening deterioration of the following conditions:  Circulatory failure, cardiac failure, sepsis and shock   Critical care was time spent personally by me on the following activities:  Development of treatment plan with patient or surrogate, discussions with consultants, evaluation of patient's response to treatment, examination of patient, obtaining history from patient or surrogate, ordering and performing treatments and interventions, ordering and review of laboratory studies, ordering and review of radiographic studies, pulse oximetry, re-evaluation of patient's condition and review of old charts   I assumed direction of critical care for this patient from another provider in my specialty: no     (including critical care time)  Medications Ordered in ED Medications  piperacillin-tazobactam (ZOSYN) IVPB 3.375 g (not administered)  vancomycin (VANCOCIN) IVPB 750 mg/150 ml premix (not administered)  diltiazem (CARDIZEM) 1 mg/mL load via infusion 5 mg (not  administered)    And  diltiazem (CARDIZEM) 100 mg in dextrose 5% 125mL (1 mg/mL) infusion (not administered)  Barium Sulfate 2.1 % SUSP (not administered)  piperacillin-tazobactam (ZOSYN) IVPB 3.375 g (0 g Intravenous Stopped 05/19/17 1348)  sodium chloride 0.9 % bolus 1,000 mL (0 mLs Intravenous Stopped 05/19/17 1332)    And  sodium chloride 0.9 % bolus 1,000 mL (0 mLs Intravenous Stopped 05/19/17 1434)  vancomycin (VANCOCIN) 1,500 mg in sodium chloride 0.9 % 500 mL IVPB (0 mg Intravenous Stopped 05/19/17 1506)  aspirin chewable tablet 324 mg (324 mg Oral Given 05/19/17 1509)     Initial Impression / Assessment and Plan / ED Course  I have reviewed the triage vital signs and the nursing notes.  Pertinent labs & imaging results that were available during my care of the patient were reviewed by me and considered in my medical decision making (see chart for details).     71 year old female here with generalized weakness and hypotension.  This occurs in the setting of recent diagnosis of possible Crohn's flare and initiation of steroids.  She is also had decreased p.o. intake.  Labs and imaging obtained and reviewed as above.  Lab work is overall very reassuring.  However, she remains persistently tachycardic with atrial fibrillation with rapid response despite fluids.  I suspect that the patient has generalized weakness secondary to dehydration in the setting of poor p.o. intake due to Crohn's flare, along with likely contribution of steroids.  Given that she has persistent tachycardia with heart rate up to the 120s, will start on a diltiazem drip in addition to fluids.  She was initially covered with broad-spectrum antibiotics due  to her hypotension, hypothermia, and tachycardia, but now I feel this is more so secondary to fluid depletion.  Otherwise, she has no ongoing chest pain and troponin is negative.  She does have some lower abdominal tenderness and given her profound hypotension on  arrival, will obtain CT to evaluate for extensive Crohn's flare as well as any other acute emergency.  She will need admission for hydration, rate control, and monitoring thereafter.  Patient care transferred to Dr. Sherry Ruffing at the end of my shift. Patient presentation, ED course, and plan of care discussed with review of all pertinent labs and imaging. Please see his/her note for further details regarding further ED course and disposition.   Final Clinical Impressions(s) / ED Diagnoses   Final diagnoses:  Hypotension, unspecified hypotension type  Atrial fibrillation with rapid ventricular response [facility])    ED Discharge Orders    None       Duffy Bruce, MD 05/19/17 1710

## 2017-05-19 NOTE — ED Triage Notes (Signed)
Per EMS pt was didn't feel well and went to her primary care in [place].  There she got and ECG which showed an MI.  EMS was called and they got did an ECG and it showed MI so they sent that report to [facility] where they determined it was not an MI.  She was hypotensive at 71/48.  Currently complaining of Vaginal itching, and weakness.  She is AOx4. NAD noted at this time.

## 2017-05-19 NOTE — ED Notes (Signed)
Placed purewick on pt. Explained procedure to pt, pt tolerated well.

## 2017-05-19 NOTE — ED Notes (Signed)
Chris RN notified @ 1302 that Code Sepsis was activated on pt via [place] w/ [facility].

## 2017-05-19 NOTE — ED Notes (Signed)
EDP at bedside  

## 2017-05-19 NOTE — H&P (Signed)
History and Physical    Amber Stephenson WIO:[id number] DOB: 1946-04-27 DOA: 05/19/2017  PCP: Timmothy Euler, MD   Patient coming from: Home  Chief Complaint: Vaginal itching, dysuria   HPI: Amber Stephenson is a 71 y.o. female with medical history significant for chronic atrial fibrillation on Eliquis, chronic diastolic CHF, bipolar disorder, insulin-dependent diabetes mellitus, hypertension, and chronic back pain, now presenting to the emergency department in atrial fibrillation with RVR.  Patient reports several days of vaginal itching and, as well as generalized weakness, malaise, and poor appetite.  She called her PCP for this yesterday and was able to see them today.  She was noted to be tachycardic and EKG in the PCP clinic was concerning for possible MI.  Blood pressure was 70/50 at the time.  EMS was called for transport to the emergency department.  The patient denies chest pain or shortness of breath and denies recent fevers or chills.  She denies any significant abdominal pain, but reports ongoing chronic back pain.  ED Course: Upon arrival to the ED, patient is found to be afebrile, saturating well on room air, tachycardic in the 130s in atrial fibrillation, and with blood pressure 102/60.  EKG features atrial fibrillation with rate 128 and inferolateral ST-ST abnormalities that are similar to prior.  Chest x-ray is negative for acute cardiopulmonary disease.  Troponin is within normal limits and acid is reassuringly normal.  Chemistry panel is notable for a glucose of 228 and CBC is unremarkable.  CT of the abdomen and pelvis was obtained and negative for any acute intra-abdominal or pelvic pathology.  Blood and urine cultures were obtained, 324 mg of aspirin was given, the patient was bolused with 2 L of normal saline and treated with empiric vancomycin and Zosyn.  She was given a 5 mg IV push of diltiazem and started on diltiazem infusion.  His blood pressure has remained stable,  tachycardia persists, and there has been no apparent respiratory distress.  Patient will be observed for ongoing evaluation and management of atrial fibrillation with RVR.  Review of Systems:  All other systems reviewed and apart from HPI, are negative.  Past Medical History:  Diagnosis Date  . Atrial fibrillation ([place])   . Bipolar affective ([place])   . CHF (congestive heart failure) ([place])   . Chronic back pain   . Diabetes mellitus without complication ([place])   . Hyperlipidemia   . Hypertension   . Pain management   . Renal insufficiency     Past Surgical History:  Procedure Laterality Date  . BREAST REDUCTION SURGERY    . EYE SURGERY Right    cateracts  . SHOULDER SURGERY Right   . THIGH SURGERY       reports that  has never smoked. she has never used smokeless tobacco. She reports that she does not drink alcohol or use drugs.  Allergies  Allergen Reactions  . Ivp Dye [Iodinated Diagnostic Agents] Anaphylaxis and Swelling    Throat closes    Family History  Problem Relation Age of Onset  . Diabetes Mother   . Heart disease Mother   . Heart disease Brother   . Heart disease Sister        CABG  . Mental illness Brother   . Diabetes Brother      Prior to Admission medications   Medication Sig Start Date End Date Taking? Authorizing Provider  apixaban (ELIQUIS) 5 MG TABS tablet Take 5 mg by mouth 2 (two) times daily.  Yes [provider]  ARIPiprazole (ABILIFY) 10 MG tablet 3 qam Patient taking differently: Take 30 mg by mouth every morning.  04/08/17  Yes Plovsky, Berneta Sages, MD  atorvastatin (LIPITOR) 40 MG tablet TAKE 1 TABLET DAILY Patient taking differently: Take 40 mg by mouth once a day 11/19/16  Yes Timmothy Euler, MD  budesonide (ENTOCORT EC) 3 MG 24 hr capsule Take 9 mg by mouth every morning.  04/01/17  Yes [provider]  carbamazepine (TEGRETOL-XR) 100 MG 12 hr tablet 1  bid Patient taking differently: Take 100 mg by mouth 2 (two)  times daily.  04/08/17  Yes Plovsky, Berneta Sages, MD  Cholecalciferol (VITAMIN D3) 1000 UNITS CAPS Take 1,000 Units by mouth daily.   Yes [provider]  Dulaglutide (TRULICITY) 9.79 YI/0.1KP SOPN Inject 0.75 mg into the skin once a week. Patient taking differently: Inject 0.75 mg into the skin every Sunday.  03/19/17  Yes Timmothy Euler, MD  furosemide (LASIX) 20 MG tablet TAKE 1 TABLET TWICE A DAY Patient taking differently: Take 20 mg by mouth two times a day 12/02/16  Yes Timmothy Euler, MD  hyoscyamine (LEVBID) 0.375 MG 12 hr tablet TAKE 1 TABLET(0.375 MG) BY MOUTH TWICE DAILY 12/12/16  Yes Timmothy Euler, MD  LANTUS SOLOSTAR 100 UNIT/ML Solostar Pen INJECT 38 UNITS UNDER THE SKIN TWICE A DAY Patient taking differently: Inject 49 units into the skin two times a day- morning and night 06/12/16  Yes Elayne Snare, MD  levalbuterol Penne Lash) 1.25 MG/3ML nebulizer solution Take 1.25 mg by nebulization every 4 (four) hours as needed for wheezing. 07/18/16  Yes Timmothy Euler, MD  lidocaine (XYLOCAINE) 2 % solution USE 20 ML IN THE MOUTH OR THROAT AS NEEDED FOR MOUTH PAIN AS DIRECTED 10/22/16  Yes Timmothy Euler, MD  LORazepam (ATIVAN) 1 MG tablet 1 qam  2   qhs Patient taking differently: Take 1-2 mg by mouth See admin instructions. 1 mg in the morning and 2 mg at bedtime 04/08/17  Yes Plovsky, Berneta Sages, MD  metoprolol tartrate (LOPRESSOR) 100 MG tablet TAKE 1 TABLET TWICE A DAY Patient taking differently: Take 100 mg by mouth two times a day 05/01/17  Yes Herminio Commons, MD  metoprolol tartrate (LOPRESSOR) 25 MG tablet TAKE 1 TABLET TWICE A DAY (TO BE TAKEN ALONG WITH THE LOPRESSOR 100 MG TWICE A DAY) Patient taking differently: Take 25 mg by mouth two times a day 11/18/16  Yes Herminio Commons, MD  NARCAN 4 MG/0.1ML LIQD nasal spray kit Place 1 spray into the nose once as needed (AS DIRECTED).  05/01/17  Yes [provider]  oxyCODONE-acetaminophen (PERCOCET)  7.5-325 MG tablet Take 1 tablet by mouth every 6 (six) hours. 05/08/17  Yes [provider]  pregabalin (LYRICA) 150 MG capsule Take 1 capsule (150 mg total) by mouth 2 (two) times daily. 03/19/17  Yes Timmothy Euler, MD  rizatriptan (MAXALT-MLT) 10 MG disintegrating tablet DISSOLVE 1 TABLET UNDER THE TONGUE IMMEDIATELY, MAY REPEAT DOSE IN 1 HOUR IF NEEDED. MAX 3 TABLETS IN 24 HOURS 05/01/17  Yes Timmothy Euler, MD  SUMAtriptan (IMITREX) 100 MG tablet TAKE 1 TABLET BY MOUTH AT ONSET OF HEADACHE. MAY REPEAT IN 2 HOURS IF HEADACHE PERSISTS OR RECURS.LIMIT 2 PER 24 HOURS 02/06/17  Yes Timmothy Euler, MD  temazepam (RESTORIL) 15 MG capsule 2  qhs Patient taking differently: Take 30 mg by mouth at bedtime.  04/08/17  Yes Plovsky, Berneta Sages, MD  tizanidine (ZANAFLEX)  6 MG capsule TAKE 1 CAPSULE THREE TIMES A DAY AS NEEDED FOR MUSCLE SPASMS Patient taking differently: Take 6 mg by mouth 3 (three) times daily as needed for muscle spasms.  03/19/17  Yes Timmothy Euler, MD  TRICOR 145 MG tablet TAKE 1 TABLET DAILY Patient taking differently: Take 145 mg by mouth once a day 05/01/17  Yes Terald Sleeper, PA-C  Blood Glucose Monitoring Suppl (FREESTYLE LITE) DEVI Use to check blood sugar tid. DX E11.22 04/04/15   Timmothy Euler, MD  FREESTYLE LITE test strip USE FOR TESTING SUGAR THREE TIMES DAILY 01/19/17   Timmothy Euler, MD  Insulin Pen Needle (SURE COMFORT PEN NEEDLES) 32G X 4 MM MISC Use with insulin pens as directed. Dx E11.9 08/16/15   Timmothy Euler, MD  Lancets (FREESTYLE) lancets Test tid. DX E11.22 04/04/15   Timmothy Euler, MD    Physical Exam: Vitals:   05/19/17 1815 05/19/17 1845 05/19/17 1900 05/19/17 1915  BP: (!) 132/91 (!) 169/104 (!) 146/79 (!) 151/81  Pulse: 95 (!) 136 88 82  Resp: 20 (!) _0 Temp:      TempSrc:      SpO2: 97% 96% 95% 96%  Weight:      Height:          Constitutional: NAD, calm, comfortable Eyes: PERTLA, lids and conjunctivae  normal ENMT: Mucous membranes are moist. Posterior pharynx clear of any exudate or lesions.   Neck: normal, supple, no masses, no thyromegaly Respiratory: clear to auscultation bilaterally, no wheezing, no crackles. Normal respiratory effort.    Cardiovascular: Rate ~120 and irregular. No significant JVD. Abdomen: No distension, no tenderness, no masses palpated. Bowel sounds normal.  Musculoskeletal: no clubbing / cyanosis. No joint deformity upper and lower extremities.  Skin: no significant rashes, lesions, ulcers. Warm, dry, well-perfused. Neurologic: CN 2-12 grossly intact. Sensation intact. Strength 5/5 in all 4 limbs.  Psychiatric: Alert and oriented x 3. Calm, cooperative.     Labs on Admission: I have personally reviewed following labs and imaging studies  CBC: Recent Labs  Lab 05/19/17 1301 05/19/17 1319  WBC 10.0  --   NEUTROABS 6.4  --   HGB 13.0 13.6  HCT 38.5 40.0  MCV 87.9  --   PLT 186  --    Basic Metabolic Panel: Recent Labs  Lab 05/19/17 1301 05/19/17 1319  NA 132* 137  K 4.1 3.5  CL 98* 98*  CO2 25  --   GLUCOSE 228* 232*  BUN 15 14  CREATININE 1.06* 1.00  CALCIUM 9.2  --    GFR: Estimated Creatinine Clearance: 54.1 mL/min (by C-G formula based on SCr of 1 mg/dL). Liver Function Tests: Recent Labs  Lab 05/19/17 1301  AST 28  ALT 24  ALKPHOS 102  BILITOT 1.2  PROT 6.1*  ALBUMIN 3.2*   No results for input(s): LIPASE, AMYLASE in the last 168 hours. No results for input(s): AMMONIA in the last 168 hours. Coagulation Profile: Recent Labs  Lab 05/19/17 1301  INR 1.40   Cardiac Enzymes: No results for input(s): CKTOTAL, CKMB, CKMBINDEX, TROPONINI in the last 168 hours. BNP (last 3 results) No results for input(s): PROBNP in the last 8760 hours. HbA1C: No results for input(s): HGBA1C in the last 72 hours. CBG: No results for input(s): GLUCAP in the last 168 hours. Lipid Profile: No results for input(s): CHOL, HDL, LDLCALC, TRIG,  CHOLHDL, LDLDIRECT in the last 72 hours. Thyroid Function Tests: Recent Labs  05/19/17 1318  TSH 1.922   Anemia Panel: No results for input(s): VITAMINB12, FOLATE, FERRITIN, TIBC, IRON, RETICCTPCT in the last 72 hours. Urine analysis:    Component Value Date/Time   COLORURINE YELLOW 05/19/2017 1402   APPEARANCEUR CLEAR 05/19/2017 1402   APPEARANCEUR Clear 11/28/2016 1515   LABSPEC 1.005 05/19/2017 1402   PHURINE 7.0 05/19/2017 1402   GLUCOSEU 50 (A) 05/19/2017 1402   HGBUR NEGATIVE 05/19/2017 1402   BILIRUBINUR NEGATIVE 05/19/2017 1402   BILIRUBINUR Negative 11/28/2016 [place] 05/19/2017 1402   PROTEINUR NEGATIVE 05/19/2017 1402   UROBILINOGEN 0.2 07/23/2015 1350   NITRITE NEGATIVE 05/19/2017 1402   LEUKOCYTESUR NEGATIVE 05/19/2017 1402   LEUKOCYTESUR 1+ (A) 11/28/2016 1515   Sepsis Labs: _0 (procalcitonin:4,lacticidven:4) )No results found for this or any previous visit (from the past 240 hour(s)).   Radiological Exams on Admission: Ct Abdomen Pelvis Wo Contrast  Result Date: 05/19/2017 CLINICAL DATA:  Generalize weakness. Dysuria and increased urinary frequency for 4 days. EXAM: CT ABDOMEN AND PELVIS WITHOUT CONTRAST TECHNIQUE: Multidetector CT imaging of the abdomen and pelvis was performed following the standard protocol without IV contrast. COMPARISON:  12/08/2016 FINDINGS: Lower chest: No acute abnormality. Hepatobiliary: Liver top-normal in size. Decreased attenuation of the liver consistent with fatty infiltration. No liver mass or focal lesion. Normal gallbladder. No bile duct dilation. Pancreas: Unremarkable. No pancreatic ductal dilatation or surrounding inflammatory changes. Spleen: Normal in size without focal abnormality. Adrenals/Urinary Tract: No adrenal masses. Kidneys are normal in overall size and position. There is mild bilateral renal cortical thinning. Lobulated renal contours are noted bilaterally. No renal masses. No stones. No  hydronephrosis. Ureters normal in course and in caliber. Bladder is unremarkable. Stomach/Bowel: Stomach is within normal limits. Appendix appears normal. No evidence of bowel wall thickening, distention, or inflammatory changes. Vascular/Lymphatic: Mild aortic atherosclerotic calcifications. No aneurysm. No adenopathy. Reproductive: Uterus and bilateral adnexa are unremarkable. Other: Small fat containing right inguinal hernia. No bowel enters this. No other hernia. No ascites. Musculoskeletal: Mild depression of the upper endplate of G71, stable from the prior study. No acute fracture. No osteoblastic or osteolytic lesions. IMPRESSION: 1. No acute findings within the abdomen or pelvis. No renal or ureteral stones. No obstructive uropathy. 2. Hepatic steatosis. 3. Mild aortic atherosclerosis. 4. Stable mild compression fracture of T11. Electronically Signed   By: Lajean Manes M.D.   On: 05/19/2017 18:51   Dg Chest Port 1 View  Result Date: 05/19/2017 CLINICAL DATA:  Sepsis. EXAM: PORTABLE CHEST 1 VIEW COMPARISON:  12/08/2016 FINDINGS: Telemetry leads overlie the chest. The cardiac silhouette is mildly enlarged. There is mild elevation of the right hemidiaphragm. No confluent airspace opacity, edema, sizable pleural effusion, or pneumothorax is identified. No acute osseous abnormality is seen. IMPRESSION: No active disease. Electronically Signed   By: Logan Bores M.D.   On: 05/19/2017 14:24    EKG: Independently reviewed. Atrial fibrillation with RVR (rate 128), inferolateral ST-T abnormality, QTc 511 ms. Similar to prior.   Assessment/Plan  1. Atrial fibrillation with RVR  - Pt presents with several days of vulvar pruritis, lethargy and poor appetite, found to be in atrial fibrillation with rate 130's  - There was hypotension that resolved with fluid-resuscitation, but tachycardia persisted  - She was given 5 mg IVP diltiazem and started on diltiazem infusion in ED  - Continue to titrate diltiazem  infusion for rate <105  - CHADS-VASc 5 (age, gender, CHF, DM, HTN) and Eliquis will be continued    2.  Hypotension, hx of HTN  - Pt was reportedly hypotensive at PCP office today with pressures in 70/50 range  - BP 102/60 on arrival to ED, increased to 150/80 after fluid-resuscitation  - BP has remained normal and stable following the IVF  - There was concern for sepsis initially with tachycardia and hypotension, but there is no fever or leukocytosis, lactate is normal, and no evidence for infection on CXR, UA, or exam  - She was given 2 liter NS bolus and empiric doses of vancomycin and Zosyn  - This was likely secondary to hypovolemia in setting of recent diarrhea and poor appetite  - Follow cultures, but hold further abx for now, resume metoprolol in am at reduced-dose   3. Insulin-dependent DM  - A1c was 9.5% last year  - Managed at home with Trulicity and Lantus 49 units BID  - Check CBG's, continue Lantus with 45 units BID and start sliding-scale correctional    4. Microscopic colitis  - Sxs have improved with steroids, will continue    5. Bipolar disorder  - Stable, continue Abilify and Tegretol, prn Ativan   6. Chronic diastolic CHF  - Appeared hypovolemic on admission and was treated with 2 liters NS  - Plan to SLIV now, continue to hold Lasix, follow daily wts and I/O's, resume beta-blocker as BP allows    7. Chronic pain - Stable, continue home regimen with Percocet and Zanaflex   8. Vulvovaginal candidiasis - Treated with fluconazole 150 mg PO     DVT prophylaxis: Eliquis  Code Status: Full  Family Communication: Discussed with patient Disposition Plan: Observe in SDU Consults called: None Admission status: Observation    Vianne Bulls, MD [facility] Pager [phone]  If 7PM-7AM, please contact night-coverage [email or web address] Password [facility]  05/19/2017, 7:56 PM

## 2017-05-19 NOTE — Progress Notes (Signed)
Pharmacy Antibiotic Note  Amber Stephenson is a 71 y.o. female admitted on 05/19/2017 with sepsis.  Pharmacy has been consulted for vancomycin and zosyn dosing.  Patient presented to primary care in [place] with urinary symptoms of dysuria, frequency and back pain with generalized weakness, CP and SOB. Sent to ED for questionable MI (ruled out in ED), code sepsis called upon arrival. Unable to provide urine sample in ED. Tmax 98.2, BP 76/50, HR 72.   Plan: Vancomycin 1500 mg IV load Vancomycin 750 mg IV q 12. Goal trough 15-20 Zosyn 3.375 mg IV q8 (4 hour infusion) Monitor C/S, renal function, clinical status, LOT/de-escalation plans Vanc trough as indicated  Height: 5\' 2"  (157.5 cm) Weight: 190 lb (86.2 kg) IBW/kg (Calculated) : 50.1  Temp (24hrs), Avg:97.6 F (36.4 C), Min:96.9 F (36.1 C), Max:98.2 F (36.8 C)  Recent Labs  Lab 05/19/17 1319 05/19/17 1320  CREATININE 1.00  --   LATICACIDVEN  --  1.56    Estimated Creatinine Clearance: 54.1 mL/min (by C-G formula based on SCr of 1 mg/dL).    Allergies  Allergen Reactions  . Ivp Dye [Iodinated Diagnostic Agents] Swelling    Throat closes    Antimicrobials this admission: Vanc 12/18 >> Zosyn 12/18 >>   Microbiology results: 12/18 BCx: ordered 12/18 UCx: ordered   Thank you for allowing pharmacy to be a part of this patient's care.   Charlene Brooke, PharmD PGY1 Pharmacy Resident [facility] [phone] 05/19/2017 1:33 PM

## 2017-05-20 ENCOUNTER — Other Ambulatory Visit: Payer: Self-pay

## 2017-05-20 ENCOUNTER — Encounter (HOSPITAL_COMMUNITY): Payer: Self-pay | Admitting: *Deleted

## 2017-05-20 DIAGNOSIS — I4891 Unspecified atrial fibrillation: Secondary | ICD-10-CM | POA: Diagnosis not present

## 2017-05-20 DIAGNOSIS — I482 Chronic atrial fibrillation: Secondary | ICD-10-CM | POA: Diagnosis not present

## 2017-05-20 LAB — GLUCOSE, CAPILLARY
Glucose-Capillary: 172 mg/dL — ABNORMAL HIGH (ref 65–99)
Glucose-Capillary: 163 mg/dL — ABNORMAL HIGH (ref 65–99)

## 2017-05-20 LAB — MRSA PCR SCREENING: MRSA by PCR: NEGATIVE

## 2017-05-20 LAB — URINE CULTURE: Culture: NO GROWTH

## 2017-05-20 LAB — BASIC METABOLIC PANEL
Anion gap: 11 (ref 5–15)
BUN: 10 mg/dL (ref 6–20)
CO2: 22 mmol/L (ref 22–32)
Calcium: 8.9 mg/dL (ref 8.9–10.3)
Chloride: 107 mmol/L (ref 101–111)
Creatinine, Ser: 0.98 mg/dL (ref 0.44–1.00)
GFR calc Af Amer: >60 mL/min (ref >60)
GFR calc non Af Amer: 58 mL/min — ABNORMAL LOW (ref >60)
Glucose, Bld: 139 mg/dL — ABNORMAL HIGH (ref 65–99)
Potassium: 3.7 mmol/L (ref 3.5–5.1)
Sodium: 140 mmol/L (ref 135–145)

## 2017-05-20 LAB — TROPONIN I
Troponin I: <0.03 ng/mL (ref <0.03)
Troponin I: <0.03 ng/mL (ref <0.03)

## 2017-05-20 LAB — MAGNESIUM: Magnesium: 1.7 mg/dL (ref 1.7–2.4)

## 2017-05-20 MED ORDER — DILTIAZEM HCL ER 60 MG PO CP12
120.0000 mg | ORAL_CAPSULE | Freq: Two times a day (BID) | ORAL | Status: DC
  Filled 2017-05-20: qty 2

## 2017-05-20 MED ORDER — DILTIAZEM HCL ER 90 MG PO CP12: 90.0000 mg | ORAL_CAPSULE | Freq: Two times a day (BID) | ORAL | Status: DC

## 2017-05-20 MED ORDER — DILTIAZEM HCL ER COATED BEADS 180 MG PO CP24
180.0000 mg | ORAL_CAPSULE | Freq: Every day | ORAL | Status: DC
  Administered 2017-05-20: 180 mg via ORAL
  Filled 2017-05-20: qty 1

## 2017-05-20 MED ORDER — DILTIAZEM HCL ER COATED BEADS 180 MG PO CP24: 180.0000 mg | ORAL_CAPSULE | Freq: Every day | ORAL | 0 refills | Status: DC

## 2017-05-20 MED ORDER — DILTIAZEM LOAD VIA INFUSION
10.0000 mg | Freq: Once | INTRAVENOUS | Status: AC
  Administered 2017-05-20: 10 mg via INTRAVENOUS
  Filled 2017-05-20: qty 10

## 2017-05-20 NOTE — Progress Notes (Signed)
Gave discharge instructions and prescriptions to patient and her husband. Removed PIV x 2. Patient's husband had her all belongings. Take patient to front door. HS [employer]

## 2017-05-20 NOTE — Discharge Summary (Signed)
Physician Discharge Summary  Amber Stephenson  NFA:[id number]  DOB: [birth year]  DOA: 05/19/2017 PCP: Timmothy Euler, MD  Admit date: 05/19/2017 Discharge date: 05/20/2017  Admitted From: Home  Disposition: Home   Recommendations for Outpatient Follow-up:  1. Follow up with PCP in 1 week  2. Follow up with Cardiology  3. Please obtain BMP/CBC in one week 4. Please follow up on the following pending results:  Discharge Condition: Stable  CODE STATUS: Full  Diet recommendation: Heart Healthy   Brief/Interim Summary: For full details see H&P/progress note but in brief, Amber Stephenson is a 71 year old female with medical history significant for chronic A. fib on Eliquis, chronic diastolic CHF, bipolar disorder, diabetes mellitus, hypertension and chronic back pain presented to the emergency department complaining of vaginal itching and generalized weakness.  Patient was seen by her PCP prior to admission who noted patient to be tachycardic with blood pressure 70/50 at the time of evaluation.  She was sent to the emergency department. Upon ED evaluation patient was found to be in A. fib with RVR around 130s with blood pressure of 102/60.  Troponin was negative  and chest x-ray was negative for cardiopulmonary disease.  Patient was admitted given IV fluids and started on Cardizem infusion. heart rate was well controlled and patient was transitioned to Cardizem p.o.  Blood pressure has been stable after fluid resuscitation.  Patient clinically improved  and was stable for discharge.  Subjective: Patient seen and examined, she is doing well, HR improved. She denies CP, SOB, palpitations and dizziness. No acute events overnight.   Discharge Diagnoses/Hospital Course:  A fib with RVR  Felt to be related to dehydration - patient responded well to IVF  Treated with Cardizem ggt, switched to Cardizem ER 180 mg daily, HR stable  Patient remains in Afib CHADSVASc 5 - continue Eliquis  Follow up  with cardiology as outpatient  HTN  Upon admission BP was soft, again felt to be dehydration/hypovolemia  Treated with IVF, BP stable now  Continue home medications  Now also on Cardizem Monitor BP   DM type 2  CBGs stable  Continue current regimen   Vulvovaginal candidiasis  Treated with fluconazole 150 mg PTA   Chronic diastolic CHF  Appears compensated at this time  Continue home medications Lasix, and BB at current dose   All other chronic medical condition were stable during the hospitalization.  On the day of the discharge the patient's vitals were stable, and no other acute medical condition were reported by patient. the patient was felt safe to be discharge to home   Discharge Instructions  You were cared for by a hospitalist during your hospital stay. If you have any questions about your discharge medications or the care you received while you were in the hospital after you are discharged, you can call the unit and asked to speak with the hospitalist on call if the hospitalist that took care of you is not available. Once you are discharged, your primary care physician will handle any further medical issues. Please note that NO REFILLS for any discharge medications will be authorized once you are discharged, as it is imperative that you return to your primary care physician (or establish a relationship with a primary care physician if you do not have one) for your aftercare needs so that they can reassess your need for medications and monitor your lab values.  Discharge Instructions    Call MD for:  difficulty breathing, headache or visual disturbances  Complete by:  As directed    Call MD for:  extreme fatigue   Complete by:  As directed    Call MD for:  hives   Complete by:  As directed    Call MD for:  persistant dizziness or light-headedness   Complete by:  As directed    Call MD for:  persistant nausea and vomiting   Complete by:  As directed    Call MD for:   redness, tenderness, or signs of infection (pain, swelling, redness, odor or green/yellow discharge around incision site)   Complete by:  As directed    Call MD for:  severe uncontrolled pain   Complete by:  As directed    Call MD for:  temperature >100.4   Complete by:  As directed    Diet - low sodium heart healthy   Complete by:  As directed    Increase activity slowly   Complete by:  As directed      Allergies as of 05/20/2017      Reactions   Ivp Dye [iodinated Diagnostic Agents] Anaphylaxis, Swelling   Throat closes      Medication List    TAKE these medications   ARIPiprazole 10 MG tablet Commonly known as:  ABILIFY 3 qam What changed:    how much to take  how to take this  when to take this  additional instructions   atorvastatin 40 MG tablet Commonly known as:  LIPITOR TAKE 1 TABLET DAILY What changed:    how much to take  how to take this  when to take this   budesonide 3 MG 24 hr capsule Commonly known as:  ENTOCORT EC Take 9 mg by mouth every morning.   carbamazepine 100 MG 12 hr tablet Commonly known as:  TEGRETOL-XR 1  bid What changed:    how much to take  how to take this  when to take this  additional instructions   diltiazem 180 MG 24 hr capsule Commonly known as:  CARDIZEM CD Take 1 capsule (180 mg total) by mouth daily. Start taking on:  05/21/2017   Dulaglutide 0.75 MG/0.5ML Sopn Commonly known as:  TRULICITY Inject 3.15 mg into the skin once a week. What changed:  when to take this   ELIQUIS 5 MG Tabs tablet Generic drug:  apixaban Take 5 mg by mouth 2 (two) times daily.   freestyle lancets Test tid. DX E11.22   FREESTYLE LITE Devi Use to check blood sugar tid. DX E11.22   FREESTYLE LITE test strip Generic drug:  glucose blood USE FOR TESTING SUGAR THREE TIMES DAILY   furosemide 20 MG tablet Commonly known as:  LASIX TAKE 1 TABLET TWICE A DAY What changed:    how much to take  how to take this  when  to take this   hyoscyamine 0.375 MG 12 hr tablet Commonly known as:  LEVBID TAKE 1 TABLET(0.375 MG) BY MOUTH TWICE DAILY   Insulin Pen Needle 32G X 4 MM Misc Commonly known as:  SURE COMFORT PEN NEEDLES Use with insulin pens as directed. Dx E11.9   LANTUS SOLOSTAR 100 UNIT/ML Solostar Pen Generic drug:  Insulin Glargine INJECT 38 UNITS UNDER THE SKIN TWICE A DAY What changed:  See the new instructions.   levalbuterol 1.25 MG/3ML nebulizer solution Commonly known as:  XOPENEX Take 1.25 mg by nebulization every 4 (four) hours as needed for wheezing.   lidocaine 2 % solution Commonly known as:  XYLOCAINE USE 20 ML  IN THE MOUTH OR THROAT AS NEEDED FOR MOUTH PAIN AS DIRECTED   LORazepam 1 MG tablet Commonly known as:  ATIVAN 1 qam  2   qhs What changed:    how much to take  how to take this  when to take this  additional instructions   metoprolol tartrate 25 MG tablet Commonly known as:  LOPRESSOR TAKE 1 TABLET TWICE A DAY (TO BE TAKEN ALONG WITH THE LOPRESSOR 100 MG TWICE A DAY) What changed:  See the new instructions.   metoprolol tartrate 100 MG tablet Commonly known as:  LOPRESSOR TAKE 1 TABLET TWICE A DAY What changed:    how much to take  how to take this  when to take this   NARCAN 4 MG/0.1ML Liqd nasal spray kit Generic drug:  naloxone Place 1 spray into the nose once as needed (AS DIRECTED).   oxyCODONE-acetaminophen 7.5-325 MG tablet Commonly known as:  PERCOCET Take 1 tablet by mouth every 6 (six) hours.   pregabalin 150 MG capsule Commonly known as:  LYRICA Take 1 capsule (150 mg total) by mouth 2 (two) times daily.   rizatriptan 10 MG disintegrating tablet Commonly known as:  MAXALT-MLT DISSOLVE 1 TABLET UNDER THE TONGUE IMMEDIATELY, MAY REPEAT DOSE IN 1 HOUR IF NEEDED. MAX 3 TABLETS IN 24 HOURS   SUMAtriptan 100 MG tablet Commonly known as:  IMITREX TAKE 1 TABLET BY MOUTH AT ONSET OF HEADACHE. MAY REPEAT IN 2 HOURS IF HEADACHE PERSISTS OR  RECURS.LIMIT 2 PER 24 HOURS   temazepam 15 MG capsule Commonly known as:  RESTORIL 2  qhs What changed:    how much to take  how to take this  when to take this  additional instructions   tizanidine 6 MG capsule Commonly known as:  ZANAFLEX TAKE 1 CAPSULE THREE TIMES A DAY AS NEEDED FOR MUSCLE SPASMS What changed:    how much to take  how to take this  when to take this  reasons to take this  additional instructions   TRICOR 145 MG tablet Generic drug:  fenofibrate TAKE 1 TABLET DAILY What changed:    how much to take  how to take this  when to take this   Vitamin D3 1000 units Caps Take 1,000 Units by mouth daily.      Follow-up Information    Timmothy Euler, MD. Schedule an appointment as soon as possible for a visit in 1 week(s).   Specialty:  Family Medicine Why:  Hospital follow up  Contact information: [address] [place] [address] [phone]          Allergies  Allergen Reactions  . Ivp Dye [Iodinated Diagnostic Agents] Anaphylaxis and Swelling    Throat closes    Consultations:  None    Procedures/Studies: Ct Abdomen Pelvis Wo Contrast  Result Date: 05/19/2017 CLINICAL DATA:  Generalize weakness. Dysuria and increased urinary frequency for 4 days. EXAM: CT ABDOMEN AND PELVIS WITHOUT CONTRAST TECHNIQUE: Multidetector CT imaging of the abdomen and pelvis was performed following the standard protocol without IV contrast. COMPARISON:  12/08/2016 FINDINGS: Lower chest: No acute abnormality. Hepatobiliary: Liver top-normal in size. Decreased attenuation of the liver consistent with fatty infiltration. No liver mass or focal lesion. Normal gallbladder. No bile duct dilation. Pancreas: Unremarkable. No pancreatic ductal dilatation or surrounding inflammatory changes. Spleen: Normal in size without focal abnormality. Adrenals/Urinary Tract: No adrenal masses. Kidneys are normal in overall size and position. There is mild bilateral  renal cortical thinning.  Lobulated renal contours are noted bilaterally. No renal masses. No stones. No hydronephrosis. Ureters normal in course and in caliber. Bladder is unremarkable. Stomach/Bowel: Stomach is within normal limits. Appendix appears normal. No evidence of bowel wall thickening, distention, or inflammatory changes. Vascular/Lymphatic: Mild aortic atherosclerotic calcifications. No aneurysm. No adenopathy. Reproductive: Uterus and bilateral adnexa are unremarkable. Other: Small fat containing right inguinal hernia. No bowel enters this. No other hernia. No ascites. Musculoskeletal: Mild depression of the upper endplate of N98, stable from the prior study. No acute fracture. No osteoblastic or osteolytic lesions. IMPRESSION: 1. No acute findings within the abdomen or pelvis. No renal or ureteral stones. No obstructive uropathy. 2. Hepatic steatosis. 3. Mild aortic atherosclerosis. 4. Stable mild compression fracture of T11. Electronically Signed   By: Lajean Manes M.D.   On: 05/19/2017 18:51   Mr Brain Wo Contrast  Result Date: 05/13/2017 CLINICAL DATA:  Frequent syncopal episodes. EXAM: MRI HEAD WITHOUT CONTRAST TECHNIQUE: Multiplanar, multiecho pulse sequences of the brain and surrounding structures were obtained without intravenous contrast. COMPARISON:  CT head without contrast 10/01/2015 FINDINGS: Brain: Scattered periventricular and subcortical T2 hyperintensities are moderately advanced for age. There is some involvement of the corpus callosum. The ventricles are normal size. No significant extra-axial fluid collection is present. Vascular: Flow is present in the major intracranial arteries. Skull and upper cervical spine: The skullbase is within normal limits. The craniocervical junction is normal. Sinuses/Orbits: The paranasal sinuses the mastoid air cells are clear. Bilateral lens replacements are present. The globes and orbits are otherwise within normal limits bilaterally.  IMPRESSION: 1. Periventricular and subcortical T2 hyperintensities are moderately advanced for age. The finding is nonspecific but can be seen in the setting of chronic microvascular ischemia, a demyelinating process such as multiple sclerosis, vasculitis, complicated migraine headaches, or as the sequelae of a prior infectious or inflammatory process. 2. No acute abnormality. Electronically Signed   By: San Morelle M.D.   On: 05/13/2017 13:56   Dg Chest Port 1 View  Result Date: 05/19/2017 CLINICAL DATA:  Sepsis. EXAM: PORTABLE CHEST 1 VIEW COMPARISON:  12/08/2016 FINDINGS: Telemetry leads overlie the chest. The cardiac silhouette is mildly enlarged. There is mild elevation of the right hemidiaphragm. No confluent airspace opacity, edema, sizable pleural effusion, or pneumothorax is identified. No acute osseous abnormality is seen. IMPRESSION: No active disease. Electronically Signed   By: Logan Bores M.D.   On: 05/19/2017 14:24    Discharge Exam: Vitals:   05/20/17 1200 05/20/17 1505  BP: (!) 137/99   Pulse:    Resp: (!) 22   Temp:    SpO2:  94%   Vitals:   05/20/17 1038 05/20/17 1142 05/20/17 1200 05/20/17 1505  BP: (!) 155/93 (!) 143/73 (!) 137/99   Pulse:      Resp:  18 (!) 22   Temp:  97.7 F (36.5 C)    TempSrc:  Oral    SpO2:    94%  Weight:      Height:        General: Pt is alert, awake, not in acute distress Cardiovascular: Irr Irr , S1/S2 +, no rubs, no gallops Respiratory: CTA bilaterally, no wheezing, no rhonchi Abdominal: Soft, NT, ND, bowel sounds + Extremities: no edema  The results of significant diagnostics from this hospitalization (including imaging, microbiology, ancillary and laboratory) are listed below for reference.     Microbiology: Recent Results (from the past 240 hour(s))  Urine culture     Status: None  Collection Time: 05/19/17  2:00 PM  Result Value Ref Range Status   Specimen Description URINE, CATHETERIZED  Final   Special  Requests NONE  Final   Culture NO GROWTH  Final   Report Status 05/20/2017 FINAL  Final  MRSA PCR Screening     Status: None   Collection Time: 05/19/17 11:00 PM  Result Value Ref Range Status   MRSA by PCR NEGATIVE NEGATIVE Final    Comment:        The GeneXpert MRSA Assay (FDA approved for NASAL specimens only), is one component of a comprehensive MRSA colonization surveillance program. It is not intended to diagnose MRSA infection nor to guide or monitor treatment for MRSA infections.      Labs: BNP (last 3 results) No results for input(s): BNP in the last 8760 hours. Basic Metabolic Panel: Recent Labs  Lab 05/19/17 1301 05/19/17 1319 05/20/17 0338  NA 132* 137 140  K 4.1 3.5 3.7  CL 98* 98* 107  CO2 25  --  22  GLUCOSE 228* 232* 139*  BUN 15 14 10   CREATININE 1.06* 1.00 0.98  CALCIUM 9.2  --  8.9  MG  --   --  1.7   Liver Function Tests: Recent Labs  Lab 05/19/17 1301  AST 28  ALT 24  ALKPHOS 102  BILITOT 1.2  PROT 6.1*  ALBUMIN 3.2*   No results for input(s): LIPASE, AMYLASE in the last 168 hours. No results for input(s): AMMONIA in the last 168 hours. CBC: Recent Labs  Lab 05/19/17 1301 05/19/17 1319  WBC 10.0  --   NEUTROABS 6.4  --   HGB 13.0 13.6  HCT 38.5 40.0  MCV 87.9  --   PLT 186  --    Cardiac Enzymes: Recent Labs  Lab 05/19/17 2304 05/20/17 0338  TROPONINI <0.03 <0.03   BNP: Invalid input(s): POCBNP CBG: Recent Labs  Lab 05/19/17 2134 05/20/17 0803 05/20/17 1144  GLUCAP 176* 172* 163*   D-Dimer No results for input(s): DDIMER in the last 72 hours. Hgb A1c No results for input(s): HGBA1C in the last 72 hours. Lipid Profile No results for input(s): CHOL, HDL, LDLCALC, TRIG, CHOLHDL, LDLDIRECT in the last 72 hours. Thyroid function studies Recent Labs    05/19/17 1318  TSH 1.922   Anemia work up No results for input(s): VITAMINB12, FOLATE, FERRITIN, TIBC, IRON, RETICCTPCT in the last 72 hours. Urinalysis     Component Value Date/Time   COLORURINE YELLOW 05/19/2017 1402   APPEARANCEUR CLEAR 05/19/2017 1402   APPEARANCEUR Clear 11/28/2016 1515   LABSPEC 1.005 05/19/2017 1402   PHURINE 7.0 05/19/2017 1402   GLUCOSEU 50 (A) 05/19/2017 1402   HGBUR NEGATIVE 05/19/2017 1402   BILIRUBINUR NEGATIVE 05/19/2017 1402   BILIRUBINUR Negative 11/28/2016 1515   KETONESUR NEGATIVE 05/19/2017 1402   PROTEINUR NEGATIVE 05/19/2017 1402   UROBILINOGEN 0.2 07/23/2015 1350   NITRITE NEGATIVE 05/19/2017 1402   LEUKOCYTESUR NEGATIVE 05/19/2017 1402   LEUKOCYTESUR 1+ (A) 11/28/2016 1515   Sepsis Labs Invalid input(s): PROCALCITONIN,  WBC,  LACTICIDVEN Microbiology Recent Results (from the past 240 hour(s))  Urine culture     Status: None   Collection Time: 05/19/17  2:00 PM  Result Value Ref Range Status   Specimen Description URINE, CATHETERIZED  Final   Special Requests NONE  Final   Culture NO GROWTH  Final   Report Status 05/20/2017 FINAL  Final  MRSA PCR Screening     Status: None   Collection Time:  05/19/17 11:00 PM  Result Value Ref Range Status   MRSA by PCR NEGATIVE NEGATIVE Final    Comment:        The GeneXpert MRSA Assay (FDA approved for NASAL specimens only), is one component of a comprehensive MRSA colonization surveillance program. It is not intended to diagnose MRSA infection nor to guide or monitor treatment for MRSA infections.     Time coordinating discharge: 35 minutes  SIGNED:  Chipper Oman, MD  [facility] 05/20/2017, 3:09 PM  Pager please text page via  [email or web address] Password [email or web address]

## 2017-05-20 NOTE — Progress Notes (Signed)
Changed IV Cardizem gtt to po Cardizem 180 mg ER daily and stopped Cardizem gtt at 1210. HR stays most of time 60-80's. Then HR is going up to 100's when she goes to bathroom. Patient wanted to talk to MD regarding being discharged today. Dr. Quincy Simmonds notified this matter and patient will be discharged to home today. [facility] [employer]

## 2017-05-21 ENCOUNTER — Telehealth: Payer: Self-pay | Admitting: Family Medicine

## 2017-05-21 DIAGNOSIS — M79676 Pain in unspecified toe(s): Secondary | ICD-10-CM | POA: Diagnosis not present

## 2017-05-21 DIAGNOSIS — L84 Corns and callosities: Secondary | ICD-10-CM | POA: Diagnosis not present

## 2017-05-21 DIAGNOSIS — E1142 Type 2 diabetes mellitus with diabetic polyneuropathy: Secondary | ICD-10-CM | POA: Diagnosis not present

## 2017-05-21 DIAGNOSIS — B351 Tinea unguium: Secondary | ICD-10-CM | POA: Diagnosis not present

## 2017-05-21 NOTE — Telephone Encounter (Signed)
Pt given appt 06/08/16 at 2:25 with Dr Wendi Snipes, offered earlier appt with another provider but pt declined saying she only wanted to see Dr Wendi Snipes.

## 2017-05-24 LAB — CULTURE, BLOOD (ROUTINE X 2)
Culture: NO GROWTH
Culture: NO GROWTH
Special Requests: ADEQUATE
Special Requests: ADEQUATE

## 2017-05-27 ENCOUNTER — Ambulatory Visit (INDEPENDENT_AMBULATORY_CARE_PROVIDER_SITE_OTHER): Payer: Medicare Other | Admitting: Family Medicine

## 2017-05-27 ENCOUNTER — Encounter: Payer: Self-pay | Admitting: Family Medicine

## 2017-05-27 VITALS — BP 96/70 | HR 73 | Temp 97.9°F | Ht 62.0 in | Wt 195.0 lb

## 2017-05-27 DIAGNOSIS — E1122 Type 2 diabetes mellitus with diabetic chronic kidney disease: Secondary | ICD-10-CM

## 2017-05-27 DIAGNOSIS — N183 Chronic kidney disease, stage 3 (moderate): Secondary | ICD-10-CM | POA: Diagnosis not present

## 2017-05-27 DIAGNOSIS — L304 Erythema intertrigo: Secondary | ICD-10-CM | POA: Diagnosis not present

## 2017-05-27 DIAGNOSIS — Z01419 Encounter for gynecological examination (general) (routine) without abnormal findings: Secondary | ICD-10-CM | POA: Diagnosis not present

## 2017-05-27 DIAGNOSIS — I5032 Chronic diastolic (congestive) heart failure: Secondary | ICD-10-CM

## 2017-05-27 DIAGNOSIS — Z124 Encounter for screening for malignant neoplasm of cervix: Secondary | ICD-10-CM

## 2017-05-27 DIAGNOSIS — Z09 Encounter for follow-up examination after completed treatment for conditions other than malignant neoplasm: Secondary | ICD-10-CM

## 2017-05-27 MED ORDER — CLOTRIMAZOLE 1 % EX CREA: 1.0000 "application " | TOPICAL_CREAM | Freq: Two times a day (BID) | CUTANEOUS | 0 refills | Status: DC

## 2017-05-27 NOTE — Patient Instructions (Addendum)
You had labs performed today.  You will be contacted with the results of the labs once they are available, usually in the next 3 days for routine lab work.  Pap should be back in 1 week.  I have sent you in a cream to apply to the affected areas in your groin twice a day for the next 10 days.  Make sure that you keep this area dry as possible as we discussed.  Follow-up in 2 weeks with Dr. Wendi Snipes for recheck.  Intertrigo Intertrigo is skin irritation or inflammation (dermatitis) that occurs when folds of skin rub together. The irritation can cause a rash and make skin raw and itchy. This condition most commonly occurs in the skin folds of these areas:  Toes.  Armpits.  Groin.  Belly.  Breasts.  Buttocks.  Intertrigo is not passed from person to person (is not contagious). What are the causes? This condition is caused by heat, moisture, friction, and lack of air circulation. The condition can be made worse by:  Sweat.  Bacteria or a fungus, such as yeast.  What increases the risk? This condition is more likely to occur if you have moisture in your skin folds. It is also more likely to develop in people who:  Have diabetes.  Are overweight.  Are on bed rest.  Live in a warm and moist climate.  Wear splints, braces, or other medical devices.  Are not able to control their bowels or bladder (have incontinence).  What are the signs or symptoms? Symptoms of this condition include:  A pink or red skin rash.  Brown patches on the skin.  Raw or scaly skin.  Itchiness.  A burning feeling.  Bleeding.  Leaking fluid.  A bad smell.  How is this diagnosed? This condition is diagnosed with a medical history and physical exam. You may also have a skin swab to test for bacteria or a fungus, such as yeast. How is this treated? Treatment may include:  Cleaning and drying your skin.  An oral antibiotic medicine or antibiotic skin cream for a bacterial  infection.  Antifungal cream or pills for an infection that was caused by a fungus, such as yeast.  Steroid ointment to relieve itchiness and irritation.  Follow these instructions at home:  Keep the affected area clean and dry.  Do not scratch your skin.  Stay in a cool environment as much as possible. Use an air conditioner or fan, if available.  Apply over-the-counter and prescription medicines only as told by your health care provider.  If you were prescribed an antibiotic medicine, use it as told by your health care provider. Do not stop using the antibiotic even if your condition improves.  Keep all follow-up visits as told by your health care provider. This is important. How is this prevented?  Maintain a healthy weight.  Take care of your feet, especially if you have diabetes. Foot care includes: ? Wearing shoes that fit well. ? Keeping your feet dry. ? Wearing clean, breathable socks.  Protect the skin around your groin and buttocks, especially if you have incontinence. Skin protection includes: ? Following a regular cleaning routine. ? Using moisturizers and skin protectants. ? Changing protection pads frequently.  Do not wear tight clothes. Wear clothes that are loose and absorbent. Wear clothes that are made of cotton.  Wear a bra that gives good support, if needed.  Shower and dry yourself thoroughly after activity. Use a hair dryer on a cool setting to  dry between skin folds, especially after you bathe.  If you have diabetes, keep your blood sugar under control. Contact a health care provider if:  Your symptoms do not improve with treatment.  Your symptoms get worse or they spread.  You notice increased redness and warmth.  You have a fever. This information is not intended to replace advice given to you by your health care provider. Make sure you discuss any questions you have with your health care provider. Document Released: 05/19/2005 Document  Revised: 10/25/2015 Document Reviewed: 11/20/2014 Elsevier Interactive Patient Education  2018 [place].   Heart Failure Heart failure means your heart has trouble pumping blood. This makes it hard for your body to work well. Heart failure is usually a long-term (chronic) condition. You must take good care of yourself and follow your doctor's treatment plan. Follow these instructions at home:  Take your heart medicine as told by your doctor. ? Do not stop taking medicine unless your doctor tells you to. ? Do not skip any dose of medicine. ? Refill your medicines before they run out. ? Take other medicines only as told by your doctor or pharmacist.  Stay active if told by your doctor. The elderly and people with severe heart failure should talk with a doctor about physical activity.  Eat heart-healthy foods. Choose foods that are without trans fat and are low in saturated fat, cholesterol, and salt (sodium). This includes fresh or frozen fruits and vegetables, fish, lean meats, fat-free or low-fat dairy foods, whole grains, and high-fiber foods. Lentils and dried peas and beans (legumes) are also good choices.  Limit salt if told by your doctor.  Cook in a healthy way. Roast, grill, broil, bake, poach, steam, or stir-fry foods.  Limit fluids as told by your doctor.  Weigh yourself every morning. Do this after you pee (urinate) and before you eat breakfast. Write down your weight to give to your doctor.  Take your blood pressure and write it down if your doctor tells you to.  Ask your doctor how to check your pulse. Check your pulse as told.  Lose weight if told by your doctor.  Stop smoking or chewing tobacco. Do not use gum or patches that help you quit without your doctor's approval.  Schedule and go to doctor visits as told.  Nonpregnant women should have no more than 1 drink a day. Men should have no more than 2 drinks a day. Talk to your doctor about drinking  alcohol.  Stop illegal drug use.  Stay current with shots (immunizations).  Manage your health conditions as told by your doctor.  Learn to manage your stress.  Rest when you are tired.  If it is really hot outside: ? Avoid intense activities. ? Use air conditioning or fans, or get in a cooler place. ? Avoid caffeine and alcohol. ? Wear loose-fitting, lightweight, and light-colored clothing.  If it is really cold outside: ? Avoid intense activities. ? Layer your clothing. ? Wear mittens or gloves, a hat, and a scarf when going outside. ? Avoid alcohol.  Learn about heart failure and get support as needed.  Get help to maintain or improve your quality of life and your ability to care for yourself as needed. Contact a doctor if:  You gain weight quickly.  You are more short of breath than usual.  You cannot do your normal activities.  You tire easily.  You cough more than normal, especially with activity.  You have any or  more puffiness (swelling) in areas such as your hands, feet, ankles, or belly (abdomen).  You cannot sleep because it is hard to breathe.  You feel like your heart is beating fast (palpitations).  You get dizzy or light-headed when you stand up. Get help right away if:  You have trouble breathing.  There is a change in mental status, such as becoming less alert or not being able to focus.  You have chest pain or discomfort.  You faint. This information is not intended to replace advice given to you by your health care provider. Make sure you discuss any questions you have with your health care provider. Document Released: 02/26/2008 Document Revised: 10/25/2015 Document Reviewed: 07/05/2012 Elsevier Interactive Patient Education  2017 [employer].

## 2017-05-27 NOTE — Progress Notes (Signed)
Amber Stephenson is a 71 y.o. female presents to office today for annual physical exam examination.    Concerns today include: 1. Hospital follow up Patient was discharged from the hospital on 05/20/2017 after being admitted for hypertension, atrial fibrillation with RVR.  Her hypotension responded well to IV fluids and she was treated with a Cardizem drip and ultimately switched to oral Cardizem for A. fib with RVR.  At discharge, she was rate controlled continue to be in atrial fibrillation.  She was continued on Eliquis at discharge.  Blood pressure remained "soft" at discharge.  She was to continue her home medications for chronic diastolic heart failure.  She notes that she has follow-up with cardiology in the next month.  Since discharge, she reports that she continues to be fatigued.  She has chronic shortness of breath which has not worsened over the last year.  She denies dizziness, falls, chest pain, lower extremity edema.  She reports compliance with her Lasix and other heart medications.  2. Rash Additionally, patient notes a rash that is itchy on the inside of bilateral inguinal folds.  She notes that this is been present for several months and that sometimes if she scratches it too much it can burn.  She has not used any medications for this area.  Denies any discharge, fevers, chills.  Marital status: Married, Substance use: none Diet: fair, Exercise: none Last colonoscopy: 03/31/2017 Last mammogram: 03/24/2017 Last pap smear: >3 years.  No hx of abnormal Refills needed today: none  Past Medical History:  Diagnosis Date  . Atrial fibrillation ([place])   . Bipolar affective ([place])   . CHF (congestive heart failure) ([place])   . Chronic back pain   . Diabetes mellitus without complication ([place])   . Hyperlipidemia   . Hypertension   . Pain management   . Renal insufficiency    Social History   Socioeconomic History  . Marital status: Married    Spouse name: Not on file  .  Number of children: 3  . Years of education: Not on file  . Highest education level: Not on file  Social Needs  . Financial resource strain: Not on file  . Food insecurity - worry: Not on file  . Food insecurity - inability: Not on file  . Transportation needs - medical: Not on file  . Transportation needs - non-medical: Not on file  Occupational History  . Not on file  Tobacco Use  . Smoking status: Never Smoker  . Smokeless tobacco: Never Used  Substance and Sexual Activity  . Alcohol use: No    Alcohol/week: 0.0 oz  . Drug use: No  . Sexual activity: Yes    Partners: Male    Birth control/protection: Post-menopausal  Other Topics Concern  . Not on file  Social History Narrative   Lives at home with husband.    Past Surgical History:  Procedure Laterality Date  . BREAST REDUCTION SURGERY    . EYE SURGERY Right    cateracts  . SHOULDER SURGERY Right   . THIGH SURGERY     Family History  Problem Relation Age of Onset  . Diabetes Mother   . Heart disease Mother   . Heart disease Brother   . Heart disease Sister        CABG  . Mental illness Brother   . Diabetes Brother     Current Outpatient Medications:  .  apixaban (ELIQUIS) 5 MG TABS tablet, Take 5 mg by mouth 2 (  two) times daily., Disp: , Rfl:  .  ARIPiprazole (ABILIFY) 10 MG tablet, 3 qam (Patient taking differently: Take 30 mg by mouth every morning. ), Disp: 270 tablet, Rfl: 3 .  ARIPiprazole (ABILIFY) 10 MG tablet, TAKE 3 TABLETS EVERY MORNING, Disp: 270 tablet, Rfl: 2 .  atorvastatin (LIPITOR) 40 MG tablet, TAKE 1 TABLET DAILY (Patient taking differently: Take 40 mg by mouth once a day), Disp: 90 tablet, Rfl: 1 .  Blood Glucose Monitoring Suppl (FREESTYLE LITE) DEVI, Use to check blood sugar tid. DX E11.22, Disp: 1 each, Rfl: 0 .  budesonide (ENTOCORT EC) 3 MG 24 hr capsule, Take 9 mg by mouth every morning. , Disp: , Rfl:  .  carbamazepine (TEGRETOL-XR) 100 MG 12 hr tablet, 1  bid (Patient taking  differently: Take 100 mg by mouth 2 (two) times daily. ), Disp: 180 tablet, Rfl: 2 .  Cholecalciferol (VITAMIN D3) 1000 UNITS CAPS, Take 1,000 Units by mouth daily., Disp: , Rfl:  .  diltiazem (CARDIZEM CD) 180 MG 24 hr capsule, Take 1 capsule (180 mg total) by mouth daily., Disp: 30 capsule, Rfl: 0 .  Dulaglutide (TRULICITY) 4.92 EF/0.0FH SOPN, Inject 0.75 mg into the skin once a week. (Patient taking differently: Inject 0.75 mg into the skin every Sunday. ), Disp: 12 pen, Rfl: 3 .  FREESTYLE LITE test strip, USE FOR TESTING SUGAR THREE TIMES DAILY, Disp: 100 each, Rfl: 3 .  furosemide (LASIX) 20 MG tablet, TAKE 1 TABLET TWICE A DAY (Patient taking differently: Take 20 mg by mouth two times a day), Disp: 180 tablet, Rfl: 1 .  hyoscyamine (LEVBID) 0.375 MG 12 hr tablet, TAKE 1 TABLET(0.375 MG) BY MOUTH TWICE DAILY, Disp: 60 tablet, Rfl: 5 .  Insulin Pen Needle (SURE COMFORT PEN NEEDLES) 32G X 4 MM MISC, Use with insulin pens as directed. Dx E11.9, Disp: 300 each, Rfl: 11 .  Lancets (FREESTYLE) lancets, Test tid. DX E11.22, Disp: 100 each, Rfl: 5 .  LANTUS SOLOSTAR 100 UNIT/ML Solostar Pen, INJECT 38 UNITS UNDER THE SKIN TWICE A DAY (Patient taking differently: 50 units into the skin two times a day- morning and night), Disp: 90 mL, Rfl: 2 .  levalbuterol (XOPENEX) 1.25 MG/3ML nebulizer solution, Take 1.25 mg by nebulization every 4 (four) hours as needed for wheezing., Disp: 72 mL, Rfl: 0 .  lidocaine (XYLOCAINE) 2 % solution, USE 20 ML IN THE MOUTH OR THROAT AS NEEDED FOR MOUTH PAIN AS DIRECTED, Disp: 100 mL, Rfl: 0 .  LORazepam (ATIVAN) 1 MG tablet, 1 qam  2   qhs (Patient taking differently: Take 1-2 mg by mouth See admin instructions. 1 mg in the morning and 2 mg at bedtime), Disp: 90 tablet, Rfl: 5 .  metoprolol tartrate (LOPRESSOR) 100 MG tablet, TAKE 1 TABLET TWICE A DAY (Patient taking differently: Take 100 mg by mouth two times a day), Disp: 180 tablet, Rfl: 3 .  metoprolol tartrate  (LOPRESSOR) 25 MG tablet, TAKE 1 TABLET TWICE A DAY (TO BE TAKEN ALONG WITH THE LOPRESSOR 100 MG TWICE A DAY) (Patient taking differently: Take 25 mg by mouth two times a day), Disp: 180 tablet, Rfl: 3 .  NARCAN 4 MG/0.1ML LIQD nasal spray kit, Place 1 spray into the nose once as needed (AS DIRECTED). , Disp: , Rfl:  .  oxyCODONE-acetaminophen (PERCOCET) 7.5-325 MG tablet, Take 1 tablet by mouth every 6 (six) hours., Disp: , Rfl:  .  pregabalin (LYRICA) 150 MG capsule, Take 1 capsule (150 mg total)  by mouth 2 (two) times daily., Disp: 180 capsule, Rfl: 1 .  rizatriptan (MAXALT-MLT) 10 MG disintegrating tablet, DISSOLVE 1 TABLET UNDER THE TONGUE IMMEDIATELY, MAY REPEAT DOSE IN 1 HOUR IF NEEDED. MAX 3 TABLETS IN 24 HOURS, Disp: 10 tablet, Rfl: 1 .  SUMAtriptan (IMITREX) 100 MG tablet, TAKE 1 TABLET BY MOUTH AT ONSET OF HEADACHE. MAY REPEAT IN 2 HOURS IF HEADACHE PERSISTS OR RECURS.LIMIT 2 PER 24 HOURS, Disp: 10 tablet, Rfl: 5 .  temazepam (RESTORIL) 15 MG capsule, 2  qhs (Patient taking differently: Take 30 mg by mouth at bedtime. ), Disp: 60 capsule, Rfl: 3 .  tizanidine (ZANAFLEX) 6 MG capsule, TAKE 1 CAPSULE THREE TIMES A DAY AS NEEDED FOR MUSCLE SPASMS (Patient taking differently: Take 6 mg by mouth 3 (three) times daily as needed for muscle spasms. ), Disp: 90 capsule, Rfl: 3 .  TRICOR 145 MG tablet, TAKE 1 TABLET DAILY (Patient taking differently: Take 145 mg by mouth once a day), Disp: 90 tablet, Rfl: 0   ROS: Review of Systems Constitutional: positive for fatigue Eyes: negative Ears, nose, mouth, throat, and face: negative Respiratory: positive for chronic dyspnea on exertion Cardiovascular: positive for fatigue and irregular heart beat Gastrointestinal: positive for abdominal pain relieved by defecation Genitourinary:negative Integument/breast: negative Hematologic/lymphatic: negative Musculoskeletal:negative Neurological: negative Behavioral/Psych: negative Endocrine:  negative Allergic/Immunologic: negative    Physical exam BP 96/70   Pulse 73   Temp 97.9 F (36.6 C) (Oral)   Ht 5' 2"  (1.575 m)   Wt 195 lb (88.5 kg)   BMI 35.67 kg/m  General appearance: cooperative, appears stated age, fatigued and no distress Head: Normocephalic, without obvious abnormality, atraumatic Eyes: negative findings: lids and lashes normal, conjunctivae and sclerae normal, corneas clear and pupils equal, round, reactive to light and accomodation Ears: normal external auditory canals Nose: Nares normal. Septum midline. Mucosa normal. No drainage or sinus tenderness. Throat: lips, mucosa, and tongue normal; teeth and gums normal Neck: no adenopathy, no carotid bruit, no JVD, supple, symmetrical, trachea midline and thyroid not enlarged, symmetric, no tenderness/mass/nodules Back: symmetric, no curvature. ROM normal. No CVA tenderness. Lungs: clear to auscultation bilaterally Heart: irregularly irregular rhythm Abdomen: obese, soft, NT, +BS Pelvic: cervix normal in appearance, no adnexal masses or tenderness, no cervical motion tenderness, rectovaginal septum normal, vagina normal without discharge and atrophic external vaginal mucosa. Extremities: extremities normal, atraumatic, no cyanosis or edema Pulses: 1+ DP bilaterally Skin: shiny, slightly erythematous rash appreciated bilateral inguinal folds.  no skin breakdown. no exudate, bleeding or purulence. Lymph nodes: Cervical, supraclavicular, and axillary nodes normal. Neurologic: Grossly normal; AOx3, responds to commands. Psych: mood stable, speech normal, affect appropriate.  Depression screen [facility] 2/9 04/29/2017 03/19/2017 02/04/2017  Decreased Interest 0 0 0  Down, Depressed, Hopeless 0 0 0  PHQ - 2 Score 0 0 0  Altered sleeping - - -  Tired, decreased energy - - -  Change in appetite - - -  Feeling bad or failure about yourself  - - -  Trouble concentrating - - -  Moving slowly or fidgety/restless - - -   Suicidal thoughts - - -  PHQ-9 Score - - -  Some recent data might be hidden    Assessment/ Plan: Brooke Bonito here for annual physical exam.   1. Well woman exam with routine gynecological exam Up-to-date on colonoscopy and mammogram.  Will recheck BMP and CBC. - Pap IG, CT/NG NAA, and HPV (high risk) [facility]  2. Hospital discharge follow-up Recheck BMP  and CBC.  If blood pressure continues to be soft but improved compared to previous visit.  She is rate controlled but in atrial fibrillation today.  She is tired and has been since hospitalization.  I suspect that this is related to chronic illness and recent hospitalization.  However, will recheck labs as above and contact patient with results. Strict return precautions and reasons for emergent evaluation in the emergency department review with patient.  She voiced understanding and will follow-up as needed.  3. CKD stage 3 due to type 2 diabetes mellitus (HCC) - Basic Metabolic Panel - CBC with Differential  4. Chronic diastolic CHF (congestive heart failure) (HCC) - Basic Metabolic Panel - CBC with Differential  5. Intertrigo No evidence of secondary bacterial infection.  Will treat with topical antifungal.  Clotrimazole cream sent into pharmacy to use twice daily for the next 10 days.  Patient to follow-up in 2 weeks with her PCP.  6. Screening for malignant neoplasm of cervix - Pap IG, CT/NG NAA, and HPV (high risk) [facility] on healthy lifestyle choices, including diet (rich in fruits, vegetables and lean meats and low in salt and simple carbohydrates) and exercise (at least 30 minutes of moderate physical activity daily).  Patient to follow up in 1 year for annual exam or sooner if needed.  2 week follow up with PCP for chronic illnesses.  Sallie Staron M. Lajuana Ripple, DO

## 2017-05-28 ENCOUNTER — Encounter (HOSPITAL_COMMUNITY): Payer: Self-pay

## 2017-05-28 ENCOUNTER — Telehealth: Payer: Self-pay | Admitting: *Deleted

## 2017-05-28 ENCOUNTER — Other Ambulatory Visit: Payer: Self-pay

## 2017-05-28 ENCOUNTER — Emergency Department (HOSPITAL_COMMUNITY)
Admission: EM | Admit: 2017-05-28 | Discharge: 2017-05-28 | Disposition: A | Discharge status: Home / Self Care | Payer: Medicare Other | Attending: Emergency Medicine | Admitting: Emergency Medicine

## 2017-05-28 DIAGNOSIS — N189 Chronic kidney disease, unspecified: Secondary | ICD-10-CM

## 2017-05-28 DIAGNOSIS — E1122 Type 2 diabetes mellitus with diabetic chronic kidney disease: Secondary | ICD-10-CM | POA: Insufficient documentation

## 2017-05-28 DIAGNOSIS — I130 Hypertensive heart and chronic kidney disease with heart failure and stage 1 through stage 4 chronic kidney disease, or unspecified chronic kidney disease: Secondary | ICD-10-CM | POA: Diagnosis not present

## 2017-05-28 DIAGNOSIS — Z79899 Other long term (current) drug therapy: Secondary | ICD-10-CM | POA: Insufficient documentation

## 2017-05-28 DIAGNOSIS — I5032 Chronic diastolic (congestive) heart failure: Secondary | ICD-10-CM | POA: Insufficient documentation

## 2017-05-28 DIAGNOSIS — N183 Chronic kidney disease, stage 3 (moderate): Secondary | ICD-10-CM | POA: Diagnosis not present

## 2017-05-28 DIAGNOSIS — Z794 Long term (current) use of insulin: Secondary | ICD-10-CM | POA: Diagnosis not present

## 2017-05-28 DIAGNOSIS — I129 Hypertensive chronic kidney disease with stage 1 through stage 4 chronic kidney disease, or unspecified chronic kidney disease: Secondary | ICD-10-CM | POA: Diagnosis not present

## 2017-05-28 DIAGNOSIS — E785 Hyperlipidemia, unspecified: Secondary | ICD-10-CM | POA: Insufficient documentation

## 2017-05-28 DIAGNOSIS — Z7901 Long term (current) use of anticoagulants: Secondary | ICD-10-CM | POA: Insufficient documentation

## 2017-05-28 DIAGNOSIS — I4891 Unspecified atrial fibrillation: Secondary | ICD-10-CM | POA: Diagnosis not present

## 2017-05-28 LAB — CBC WITH DIFFERENTIAL/PLATELET
Basophils Absolute: 0 10*3/uL (ref 0.0–0.2)
Basophils Absolute: 0 10*3/uL (ref 0.0–0.1)
Basophils Relative: 0 %
Basos: 0 %
EOS (ABSOLUTE): 0.1 10*3/uL (ref 0.0–0.4)
Eos: 1 %
Eosinophils Absolute: 0.1 10*3/uL (ref 0.0–0.7)
Eosinophils Relative: 1 %
HCT: 44.1 % (ref 36.0–46.0)
Hematocrit: 43.3 % (ref 34.0–46.6)
Hemoglobin: 14.2 g/dL (ref 11.1–15.9)
Hemoglobin: 14.7 g/dL (ref 12.0–15.0)
Immature Grans (Abs): 0.1 10*3/uL (ref 0.0–0.1)
Immature Granulocytes: 1 %
Lymphocytes Absolute: 4.7 10*3/uL — ABNORMAL HIGH (ref 0.7–3.1)
Lymphocytes Relative: 34 %
Lymphs Abs: 3.9 10*3/uL (ref 0.7–4.0)
Lymphs: 38 %
MCH: 29.4 pg (ref 26.6–33.0)
MCH: 29.8 pg (ref 26.0–34.0)
MCHC: 32.8 g/dL (ref 31.5–35.7)
MCHC: 33.3 g/dL (ref 30.0–36.0)
MCV: 90 fL (ref 79–97)
MCV: 89.3 fL (ref 78.0–100.0)
Monocytes Absolute: 1 10*3/uL — ABNORMAL HIGH (ref 0.1–0.9)
Monocytes Absolute: 0.8 10*3/uL (ref 0.1–1.0)
Monocytes Relative: 7 %
Monocytes: 8 %
Neutro Abs: 6.7 10*3/uL (ref 1.7–7.7)
Neutrophils Absolute: 6.7 10*3/uL (ref 1.4–7.0)
Neutrophils Relative %: 58 %
Neutrophils: 52 %
Platelets: 332 10*3/uL (ref 150–379)
Platelets: 318 10*3/uL (ref 150–400)
RBC: 4.83 x10E6/uL (ref 3.77–5.28)
RBC: 4.94 MIL/uL (ref 3.87–5.11)
RDW: 14.7 % (ref 12.3–15.4)
RDW: 13.8 % (ref 11.5–15.5)
WBC: 12.6 10*3/uL — ABNORMAL HIGH (ref 3.4–10.8)
WBC: 11.6 10*3/uL — ABNORMAL HIGH (ref 4.0–10.5)

## 2017-05-28 LAB — COMPREHENSIVE METABOLIC PANEL
ALT: 16 U/L (ref 14–54)
AST: 19 U/L (ref 15–41)
Albumin: 3.8 g/dL (ref 3.5–5.0)
Alkaline Phosphatase: 150 U/L — ABNORMAL HIGH (ref 38–126)
Anion gap: 12 (ref 5–15)
BUN: 24 mg/dL — ABNORMAL HIGH (ref 6–20)
CO2: 27 mmol/L (ref 22–32)
Calcium: 9.7 mg/dL (ref 8.9–10.3)
Chloride: 100 mmol/L — ABNORMAL LOW (ref 101–111)
Creatinine, Ser: 1.16 mg/dL — ABNORMAL HIGH (ref 0.44–1.00)
GFR calc Af Amer: 54 mL/min — ABNORMAL LOW (ref >60)
GFR calc non Af Amer: 47 mL/min — ABNORMAL LOW (ref >60)
Glucose, Bld: 293 mg/dL — ABNORMAL HIGH (ref 65–99)
Potassium: 4.1 mmol/L (ref 3.5–5.1)
Sodium: 139 mmol/L (ref 135–145)
Total Bilirubin: 0.6 mg/dL (ref 0.3–1.2)
Total Protein: 6.9 g/dL (ref 6.5–8.1)

## 2017-05-28 LAB — BASIC METABOLIC PANEL
BUN/Creatinine Ratio: 15 (ref 12–28)
BUN: 26 mg/dL (ref 8–27)
CO2: 25 mmol/L (ref 20–29)
Calcium: 9.9 mg/dL (ref 8.7–10.3)
Chloride: 96 mmol/L (ref 96–106)
Creatinine, Ser: 1.7 mg/dL — ABNORMAL HIGH (ref 0.57–1.00)
GFR calc Af Amer: 35 mL/min/{1.73_m2} — ABNORMAL LOW (ref >59)
GFR calc non Af Amer: 30 mL/min/{1.73_m2} — ABNORMAL LOW (ref >59)
Glucose: 286 mg/dL — ABNORMAL HIGH (ref 65–99)
Potassium: 4.1 mmol/L (ref 3.5–5.2)
Sodium: 138 mmol/L (ref 134–144)

## 2017-05-28 NOTE — ED Notes (Signed)
Ptt statse she understands instructions. Home stable with steady gait.

## 2017-05-28 NOTE — ED Triage Notes (Signed)
Pt reports generalized fatigue X1 month. She was called by her PCP and told to come to the ER due to possible AKI. Creatinine noted to be 1.7. She has stage 3 CKD.

## 2017-05-28 NOTE — Discharge Instructions (Signed)
Please read attached information. If you experience any new or worsening signs or symptoms please return to the emergency room for evaluation. Please follow-up with your primary care provider or specialist as discussed.  °

## 2017-05-28 NOTE — ED Provider Notes (Signed)
[facility] EMERGENCY DEPARTMENT Provider Note   CSN: [id number] Arrival date & time: 05/28/17  9179     History   Chief Complaint Chief Complaint  Patient presents with  . Abnormal Lab    HPI Amber Stephenson is a 71 y.o. female.  HPI   71 year old female with past medical history of A. fib, bipolar, CHF, diabetes, hyperlipidemia, hypertension presents today at the request of [facility].  Patient reports that she was seen yesterday for routine visit and Pap smear.  Patient notes that she has been fatigued over the last month, recently hospitalized on 05/20/2017 secondary to hypotension.  Patient notes that she was called this morning and told to come to the emergency room as she had elevated kidney function tests.  Patient reports she does have a history of CKD stage III secondary to type 2 diabetes.  Patient reports she is urinating normal, denies any abdominal pain, chest pain, fever, or any other complaints other than generalized fatigue for the last month.    Past Medical History:  Diagnosis Date  . Atrial fibrillation ([place])   . Bipolar affective ([place])   . CHF (congestive heart failure) ([place])   . Chronic back pain   . Diabetes mellitus without complication ([place])   . Hyperlipidemia   . Hypertension   . Pain management   . Renal insufficiency     Patient Active Problem List   Diagnosis Date Noted  . Atrial fibrillation with RVR ([place]) 05/19/2017  . Microscopic colitis 05/19/2017  . Hypotension 05/19/2017  . Vulvovaginal candidiasis 05/19/2017  . Fibromyalgia 03/19/2017  . Healthcare maintenance 02/12/2016  . Migraine headache with aura 02/12/2016  . Diabetic neuropathy ([place]) 02/06/2016  . Depression   . Herpes genitalis in women 07/16/2015  . Chronic anticoagulation 05/30/2015  . Family history of coronary artery disease in sister 05/30/2015  . Chronic diastolic CHF (congestive heart failure) ([place]) 05/30/2015  . Chest  pain with moderate risk of acute coronary syndrome 05/29/2015  . Essential hypertension   . RLS (restless legs syndrome) 04/27/2015  . Lipoma 02/08/2015  . Bipolar disorder ([place]) 01/23/2015  . Insomnia 01/23/2015  . CKD stage 3 due to type 2 diabetes mellitus ([place]) 01/05/2015  . Chronic back pain 01/04/2015  . Permanent atrial fibrillation ([place]) 01/04/2015  . Diabetes mellitus with stage 2 chronic kidney disease ([place])   . Hyperlipidemia     Past Surgical History:  Procedure Laterality Date  . BREAST REDUCTION SURGERY    . EYE SURGERY Right    cateracts  . SHOULDER SURGERY Right   . THIGH SURGERY      OB History    No data available       Home Medications    Prior to Admission medications   Medication Sig Start Date End Date Taking? Authorizing Provider  apixaban (ELIQUIS) 5 MG TABS tablet Take 5 mg by mouth 2 (two) times daily.   Yes [provider]  ARIPiprazole (ABILIFY) 10 MG tablet 3 qam Patient taking differently: Take 30 mg by mouth every morning.  04/08/17  Yes Plovsky, Berneta Sages, MD  atorvastatin (LIPITOR) 40 MG tablet TAKE 1 TABLET DAILY Patient taking differently: Take 40 mg by mouth once a day 11/19/16  Yes Timmothy Euler, MD  Blood Glucose Monitoring Suppl (FREESTYLE LITE) DEVI Use to check blood sugar tid. DX E11.22 04/04/15  Yes Timmothy Euler, MD  budesonide (ENTOCORT EC) 3 MG 24 hr capsule Take 9 mg by mouth  every morning.  04/01/17  Yes [provider]  carbamazepine (TEGRETOL-XR) 100 MG 12 hr tablet 1  bid Patient taking differently: Take 100 mg by mouth 2 (two) times daily.  04/08/17  Yes Plovsky, Berneta Sages, MD  Cholecalciferol (VITAMIN D3) 1000 UNITS CAPS Take 1,000 Units by mouth daily.   Yes [provider]  clotrimazole (LOTRIMIN) 1 % cream Apply 1 application topically 2 (two) times daily. 05/27/17  Yes Ronnie Doss M, DO  diltiazem (CARDIZEM CD) 180 MG 24 hr capsule Take 1 capsule (180 mg total) by mouth daily. 05/21/17   Yes Patrecia Pour, Christean Grief, MD  Dulaglutide (TRULICITY) 3.73 SK/8.7GO SOPN Inject 0.75 mg into the skin once a week. Patient taking differently: Inject 0.75 mg into the skin every Sunday.  03/19/17  Yes Timmothy Euler, MD  FREESTYLE LITE test strip USE FOR TESTING SUGAR THREE TIMES DAILY 01/19/17  Yes Timmothy Euler, MD  furosemide (LASIX) 20 MG tablet TAKE 1 TABLET TWICE A DAY Patient taking differently: Take 20 mg by mouth two times a day 12/02/16  Yes Timmothy Euler, MD  hyoscyamine (LEVBID) 0.375 MG 12 hr tablet TAKE 1 TABLET(0.375 MG) BY MOUTH TWICE DAILY Patient taking differently: TAKE 0.375 MG BY MOUTH TWICE DAILY 12/12/16  Yes Timmothy Euler, MD  Insulin Pen Needle (SURE COMFORT PEN NEEDLES) 32G X 4 MM MISC Use with insulin pens as directed. Dx E11.9 08/16/15  Yes Timmothy Euler, MD  ipratropium-albuterol (DUONEB) 0.5-2.5 (3) MG/3ML SOLN Take 3 mLs by nebulization every 4 (four) hours as needed.   Yes [provider]  Lancets (FREESTYLE) lancets Test tid. DX E11.22 04/04/15  Yes Timmothy Euler, MD  LANTUS SOLOSTAR 100 UNIT/ML Solostar Pen INJECT 38 UNITS UNDER THE SKIN TWICE A DAY Patient taking differently: 50 units into the skin two times a day- morning and night 06/12/16  Yes Elayne Snare, MD  levalbuterol Penne Lash) 1.25 MG/3ML nebulizer solution Take 1.25 mg by nebulization every 4 (four) hours as needed for wheezing. 07/18/16  Yes Timmothy Euler, MD  lidocaine (XYLOCAINE) 2 % solution USE 20 ML IN THE MOUTH OR THROAT AS NEEDED FOR MOUTH PAIN AS DIRECTED 10/22/16  Yes Timmothy Euler, MD  LORazepam (ATIVAN) 1 MG tablet 1 qam  2   qhs Patient taking differently: Take 1-2 mg by mouth See admin instructions. 1 mg in the morning and 2 mg at bedtime 04/08/17  Yes Plovsky, Berneta Sages, MD  metoprolol tartrate (LOPRESSOR) 100 MG tablet TAKE 1 TABLET TWICE A DAY Patient taking differently: Take 100 mg by mouth two times a day 05/01/17  Yes Herminio Commons, MD    metoprolol tartrate (LOPRESSOR) 25 MG tablet TAKE 1 TABLET TWICE A DAY (TO BE TAKEN ALONG WITH THE LOPRESSOR 100 MG TWICE A DAY) Patient taking differently: Take 25 mg by mouth two times a day 11/18/16  Yes Herminio Commons, MD  NARCAN 4 MG/0.1ML LIQD nasal spray kit Place 1 spray into the nose once as needed (AS DIRECTED).  05/01/17  Yes [provider]  oxyCODONE-acetaminophen (PERCOCET) 7.5-325 MG tablet Take 1 tablet by mouth every 6 (six) hours. 05/08/17  Yes [provider]  pregabalin (LYRICA) 150 MG capsule Take 1 capsule (150 mg total) by mouth 2 (two) times daily. 03/19/17  Yes Timmothy Euler, MD  rizatriptan (MAXALT-MLT) 10 MG disintegrating tablet DISSOLVE 1 TABLET UNDER THE TONGUE IMMEDIATELY, MAY REPEAT DOSE IN 1 HOUR IF NEEDED. MAX 3 TABLETS IN 24 HOURS 05/01/17  Yes Timmothy Euler, MD  SUMAtriptan (IMITREX) 100 MG tablet TAKE 1 TABLET BY MOUTH AT ONSET OF HEADACHE. MAY REPEAT IN 2 HOURS IF HEADACHE PERSISTS OR RECURS.LIMIT 2 PER 24 HOURS 02/06/17  Yes Timmothy Euler, MD  temazepam (RESTORIL) 15 MG capsule 2  qhs Patient taking differently: Take 30 mg by mouth at bedtime.  04/08/17  Yes Plovsky, Berneta Sages, MD  tizanidine (ZANAFLEX) 6 MG capsule TAKE 1 CAPSULE THREE TIMES A DAY AS NEEDED FOR MUSCLE SPASMS Patient taking differently: Take 6 mg by mouth 3 (three) times daily as needed for muscle spasms.  03/19/17  Yes Timmothy Euler, MD  TRICOR 145 MG tablet TAKE 1 TABLET DAILY Patient taking differently: Take 145 mg by mouth once daily 05/01/17  Yes Terald Sleeper, PA-C  ARIPiprazole (ABILIFY) 10 MG tablet TAKE 3 TABLETS EVERY MORNING Patient not taking: Reported on 05/28/2017 05/20/17   Norma Fredrickson, MD    Family History Family History  Problem Relation Age of Onset  . Diabetes Mother   . Heart disease Mother   . Heart disease Brother   . Heart disease Sister        CABG  . Mental illness Brother   . Diabetes Brother     Social  History Social History   Tobacco Use  . Smoking status: Never Smoker  . Smokeless tobacco: Never Used  Substance Use Topics  . Alcohol use: No    Alcohol/week: 0.0 oz  . Drug use: No     Allergies   Ivp dye [iodinated diagnostic agents]   Review of Systems Review of Systems  All other systems reviewed and are negative.    Physical Exam Updated Vital Signs BP (!) 168/109 (BP Location: Right Arm)   Pulse 76   Temp 98.7 F (37.1 C) (Oral)   Resp 18   SpO2 100%   Physical Exam  Constitutional: She is oriented to person, place, and time. She appears well-developed and well-nourished.  HENT:  Head: Normocephalic and atraumatic.  Eyes: Conjunctivae are normal. Pupils are equal, round, and reactive to light. Right eye exhibits no discharge. Left eye exhibits no discharge. No scleral icterus.  Neck: Normal range of motion. No JVD present. No tracheal deviation present.  Cardiovascular: Normal rate, regular rhythm and normal heart sounds.  Pulmonary/Chest: Effort normal and breath sounds normal. No stridor. No respiratory distress. She has no wheezes. She has no rales.  Neurological: She is alert and oriented to person, place, and time. Coordination normal.  Psychiatric: She has a normal mood and affect. Her behavior is normal. Judgment and thought content normal.  Nursing note and vitals reviewed.    ED Treatments / Results  Labs (all labs ordered are listed, but only abnormal results are displayed) Labs Reviewed  COMPREHENSIVE METABOLIC PANEL - Abnormal; Notable for the following components:      Result Value   Chloride 100 (*)    Glucose, Bld 293 (*)    BUN 24 (*)    Creatinine, Ser 1.16 (*)    Alkaline Phosphatase 150 (*)    GFR calc non Af Amer 47 (*)    GFR calc Af Amer 54 (*)    All other components within normal limits  CBC WITH DIFFERENTIAL/PLATELET - Abnormal; Notable for the following components:   WBC 11.6 (*)    All other components within normal  limits    EKG  EKG Interpretation None       Radiology No results found.  Procedures  Procedures (including critical care time)  Medications Ordered in ED Medications - No data to display   Initial Impression / Assessment and Plan / ED Course  I have reviewed the triage vital signs and the nursing notes.  Pertinent labs & imaging results that were available during my care of the patient were reviewed by me and considered in my medical decision making (see chart for details).      Final Clinical Impressions(s) / ED Diagnoses   Final diagnoses:  Chronic kidney disease, unspecified CKD stage    Labs: CMP, CBC  Imaging:  Consults:  Therapeutics:  Discharge Meds:   Assessment/Plan: 71 year old female with CKD presents today with slight elevation in creatinine.  She was seen yesterday for routine visit where he was noted her creatinine was 1.70 with a GFR of 30.  She was called today and instructed to come to the emergency room.  Upon evaluation her creatinine today is 1.16 with a GFR of 47.  Patient does have a history of CKD, this appears to be within her normal limits.  She denies any significant complaints today.  I do not see any need for further evaluation or management of this as she does have a nephrologist that she is followed by.  I encouraged her to touch base with nephrology inform them of today's visit, follow-up with either nephrology or primary care for repeat lab draw to check creatinine.  Patient given strict return precautions, she verbalized understanding and agreement plan had no further questions or concerns at the time of discharge.      ED Discharge Orders    None       Francee Gentile 05/28/17 1205    Julianne Rice, MD 05/30/17 1620

## 2017-05-28 NOTE — Telephone Encounter (Signed)
Called and informed patient of lab results and informed that she would need to be evaluated in the ED for kidney injury with acute failure.   Patient verbalized understanding and states that she will go to the ED

## 2017-05-29 ENCOUNTER — Other Ambulatory Visit: Payer: Self-pay | Admitting: Family Medicine

## 2017-05-29 DIAGNOSIS — Z79899 Other long term (current) drug therapy: Secondary | ICD-10-CM | POA: Diagnosis not present

## 2017-05-29 DIAGNOSIS — M545 Low back pain: Secondary | ICD-10-CM | POA: Diagnosis not present

## 2017-05-29 DIAGNOSIS — M5136 Other intervertebral disc degeneration, lumbar region: Secondary | ICD-10-CM | POA: Diagnosis not present

## 2017-05-31 LAB — PAP IG, CT-NG NAA, HPV HIGH-RISK
Chlamydia, Nuc. Acid Amp: NEGATIVE
Gonococcus by Nucleic Acid Amp: NEGATIVE
HPV, high-risk: NEGATIVE
PAP Smear Comment: 0

## 2017-06-03 ENCOUNTER — Other Ambulatory Visit: Payer: Self-pay | Admitting: Family Medicine

## 2017-06-03 ENCOUNTER — Telehealth: Payer: Self-pay | Admitting: Family Medicine

## 2017-06-03 MED ORDER — FLUCONAZOLE 150 MG PO TABS: 150.0000 mg | ORAL_TABLET | Freq: Once | ORAL | 0 refills | Status: AC

## 2017-06-03 NOTE — Telephone Encounter (Signed)
Patient aware and verbalizes understanding. 

## 2017-06-03 NOTE — Telephone Encounter (Signed)
Diflucan sent to pharmacy.  If no improvement, she should follow up for evaluation.

## 2017-06-03 NOTE — Telephone Encounter (Signed)
Patient states that she has been having vaginal itching and white vaginal discharge x 1 week. Would like something called into the pharmacy. Covering PCP- please advise.

## 2017-06-08 ENCOUNTER — Ambulatory Visit (INDEPENDENT_AMBULATORY_CARE_PROVIDER_SITE_OTHER): Payer: Medicare Other | Admitting: Family Medicine

## 2017-06-08 ENCOUNTER — Encounter: Payer: Self-pay | Admitting: Family Medicine

## 2017-06-08 VITALS — BP 120/83 | HR 86 | Temp 96.7°F | Ht 62.0 in | Wt 198.4 lb

## 2017-06-08 DIAGNOSIS — E1122 Type 2 diabetes mellitus with diabetic chronic kidney disease: Secondary | ICD-10-CM

## 2017-06-08 DIAGNOSIS — N182 Chronic kidney disease, stage 2 (mild): Secondary | ICD-10-CM

## 2017-06-08 DIAGNOSIS — N183 Chronic kidney disease, stage 3 (moderate): Secondary | ICD-10-CM | POA: Diagnosis not present

## 2017-06-08 LAB — BAYER DCA HB A1C WAIVED: HB A1C (BAYER DCA - WAIVED): 12.2 % — ABNORMAL HIGH (ref <7.0)

## 2017-06-08 MED ORDER — DULAGLUTIDE 0.75 MG/0.5ML ~~LOC~~ SOAJ: 0.7500 mg | SUBCUTANEOUS | 3 refills | Status: DC

## 2017-06-08 NOTE — Progress Notes (Signed)
   HPI  Patient presents today for hospital follow-up.  Patient was seen in the hospital on December 18 for hypotension, she was then seen on the 27th for acute kidney injury.  Creatinine had begun to improve almost back to baseline when she was seen in the emergency room. She is tolerating food and fluids like usual and feels well.  Patient states that she has not been checking her blood sugar or watching her diet lately, she is taking 50 units of Lantus plus her weekly injection of Trulicity. She denies hypoglycemia  PMH: Smoking status noted ROS: Per HPI  Objective: BP 120/83   Pulse 86   Temp (!) 96.7 F (35.9 C) (Oral)   Ht 5\' 2"  (1.575 m)   Wt 198 lb 6.4 oz (90 kg)   BMI 36.29 kg/m  Gen: NAD, alert, cooperative with exam HEENT: NCAT CV: RRR, good S1/S2, no murmur Resp: CTABL, no wheezes, non-labored Ext: No edema, warm Neuro: Alert and oriented, No gross deficits  Assessment and plan:  #Type 2 diabetes. Uncontrolled previoulsy but tolerating meds well.  A1c today Patient has transitioned over to Trulicity.  #Chronic kidney disease stage III Status post AK I resolved, back to baselin from the summer.  Discussed, Pt with multiple bruises from recent labs- deferred venipuncture.     Orders Placed This Encounter  Procedures  . Bayer DCA Hb A1c Waived    Meds ordered this encounter  Medications  . Dulaglutide (TRULICITY) 3.29 VB/1.6OM SOPN    Sig: Inject 0.75 mg into the skin once a week.    Dispense:  12 pen    Refill:  3    Discontinue bydureon when trulicity filled    Laroy Apple, MD [facility] 06/08/2017, 2:55 PM

## 2017-06-08 NOTE — Patient Instructions (Signed)
Great to see you!  Come back in 3 months unless you need us sooner.    

## 2017-06-09 ENCOUNTER — Other Ambulatory Visit: Payer: Self-pay | Admitting: Family Medicine

## 2017-06-09 DIAGNOSIS — E1122 Type 2 diabetes mellitus with diabetic chronic kidney disease: Secondary | ICD-10-CM

## 2017-06-09 DIAGNOSIS — N182 Chronic kidney disease, stage 2 (mild): Principal | ICD-10-CM

## 2017-06-10 ENCOUNTER — Ambulatory Visit: Payer: Self-pay | Admitting: Physician Assistant

## 2017-06-16 ENCOUNTER — Other Ambulatory Visit: Payer: Self-pay | Admitting: Family Medicine

## 2017-06-16 NOTE — Telephone Encounter (Signed)
Last seen 06/08/17  Dr Wendi Snipes

## 2017-06-17 ENCOUNTER — Other Ambulatory Visit: Payer: Self-pay | Admitting: Family Medicine

## 2017-06-18 ENCOUNTER — Telehealth: Payer: Self-pay

## 2017-06-18 NOTE — Telephone Encounter (Signed)
Amber Stephenson back about GI referral and she was confused she thought Endocrine was GI  So all is good

## 2017-06-18 NOTE — Telephone Encounter (Signed)
Pt bbelieved she was getting a GI referral, I did place an endo referral.   Will ask for reason and place  Laroy Apple, MD [facility] 06/18/2017, 11:43 AM

## 2017-06-18 NOTE — Telephone Encounter (Signed)
Patient thought she was getting a GI referral

## 2017-06-22 ENCOUNTER — Other Ambulatory Visit: Payer: Self-pay | Admitting: *Deleted

## 2017-06-22 MED ORDER — DULAGLUTIDE 0.75 MG/0.5ML ~~LOC~~ SOAJ: 0.7500 mg | SUBCUTANEOUS | 3 refills | Status: DC

## 2017-06-23 ENCOUNTER — Telehealth: Payer: Self-pay | Admitting: Family Medicine

## 2017-06-23 ENCOUNTER — Other Ambulatory Visit: Payer: Self-pay | Admitting: Family Medicine

## 2017-06-23 MED ORDER — FLUCONAZOLE 150 MG PO TABS
ORAL_TABLET | ORAL | 0 refills | Status: DC
Start: 2017-06-23 — End: 2017-07-15

## 2017-06-23 NOTE — Telephone Encounter (Signed)
Patient states she has had a yeast infection x 2 weeks. Would like something called into the pharamcy. Please advise

## 2017-06-23 NOTE — Telephone Encounter (Signed)
Diflucan sent  Laroy Apple, MD [facility] 06/23/2017, 5:05 PM

## 2017-06-23 NOTE — Addendum Note (Signed)
Addended by: Timmothy Euler on: 06/23/2017 05:06 PM   Modules accepted: Orders

## 2017-06-24 NOTE — Telephone Encounter (Signed)
Patient notified that rx sent to pharmacy 

## 2017-06-24 NOTE — Telephone Encounter (Signed)
Pt aware refill was sent to [facility] on 06/22/17 She will check with them

## 2017-06-26 ENCOUNTER — Ambulatory Visit (INDEPENDENT_AMBULATORY_CARE_PROVIDER_SITE_OTHER): Payer: Medicare Other | Admitting: Cardiovascular Disease

## 2017-06-26 ENCOUNTER — Encounter: Payer: Self-pay | Admitting: Cardiovascular Disease

## 2017-06-26 VITALS — BP 118/84 | HR 65 | Ht 62.0 in | Wt 205.0 lb

## 2017-06-26 DIAGNOSIS — I10 Essential (primary) hypertension: Secondary | ICD-10-CM | POA: Diagnosis not present

## 2017-06-26 DIAGNOSIS — I5032 Chronic diastolic (congestive) heart failure: Secondary | ICD-10-CM

## 2017-06-26 DIAGNOSIS — E782 Mixed hyperlipidemia: Secondary | ICD-10-CM

## 2017-06-26 DIAGNOSIS — I482 Chronic atrial fibrillation: Secondary | ICD-10-CM

## 2017-06-26 DIAGNOSIS — Z9289 Personal history of other medical treatment: Secondary | ICD-10-CM | POA: Diagnosis not present

## 2017-06-26 DIAGNOSIS — I4821 Permanent atrial fibrillation: Secondary | ICD-10-CM

## 2017-06-26 MED ORDER — DILTIAZEM HCL ER COATED BEADS 120 MG PO CP24: 120.0000 mg | ORAL_CAPSULE | Freq: Every day | ORAL | 3 refills | Status: DC

## 2017-06-26 NOTE — Patient Instructions (Signed)
Your physician wants you to follow-up in:  6 months with Dr.Koneswaran You will receive a reminder letter in the mail two months in advance. If you don't receive a letter, please call our office to schedule the follow-up appointment.    START Diltiazem 120 mg daily    All other medications stay the same      No lab work or tests ordered today.    Thank you for choosing [facility] !

## 2017-06-26 NOTE — Progress Notes (Signed)
SUBJECTIVE: The patient presents for routine follow-up.  She was hospitalized in December 2018 with rapid atrial fibrillation felt secondary to dehydration.  She was given IV fluids and initially started on a Cardizem drip which was eventually transition to extended release diltiazem. She also has chronic diastolic heart failure.   She has been feeling well and denies chest pain.  She does have shortness of breath but began exercising this week.  She plans to lose weight.  She has been taking long acting diltiazem 180 mg daily but took her last dose yesterday.  She is also taking metoprolol 125 mg twice daily.  She denies orthopnea and leg swelling.      Review of Systems: As per "subjective", otherwise negative.  Allergies  Allergen Reactions  . Ivp Dye [Iodinated Diagnostic Agents] Anaphylaxis and Swelling    Throat closes    Current Outpatient Medications  Medication Sig Dispense Refill  . apixaban (ELIQUIS) 5 MG TABS tablet Take 5 mg by mouth 2 (two) times daily.    . ARIPiprazole (ABILIFY) 10 MG tablet 3 qam (Patient taking differently: Take 30 mg by mouth every morning. ) 270 tablet 3  . ARIPiprazole (ABILIFY) 10 MG tablet TAKE 3 TABLETS EVERY MORNING 270 tablet 2  . atorvastatin (LIPITOR) 40 MG tablet TAKE 1 TABLET DAILY (Patient taking differently: Take 40 mg by mouth once a day) 90 tablet 1  . Blood Glucose Monitoring Suppl (FREESTYLE LITE) DEVI Use to check blood sugar tid. DX E11.22 1 each 0  . budesonide (ENTOCORT EC) 3 MG 24 hr capsule Take 9 mg by mouth every morning.     . Cholecalciferol (VITAMIN D3) 1000 UNITS CAPS Take 1,000 Units by mouth daily.    . clotrimazole (LOTRIMIN) 1 % cream APPLY EXTERNALLY TO THE AFFECTED AREA TWICE DAILY 30 g 2  . Dulaglutide (TRULICITY) 6.30 ZS/0.1UX SOPN Inject 0.75 mg into the skin once a week. 12 pen 3  . fluconazole (DIFLUCAN) 150 MG tablet Take one pill and repeat in 3 days 2 tablet 0  . FREESTYLE LITE test strip USE FOR  TESTING SUGAR THREE TIMES DAILY 100 each 3  . furosemide (LASIX) 20 MG tablet TAKE 1 TABLET TWICE A DAY 180 tablet 1  . Insulin Pen Needle (SURE COMFORT PEN NEEDLES) 32G X 4 MM MISC Use with insulin pens as directed. Dx E11.9 300 each 11  . ipratropium-albuterol (DUONEB) 0.5-2.5 (3) MG/3ML SOLN Take 3 mLs by nebulization every 4 (four) hours as needed.    . Lancets (FREESTYLE) lancets Test tid. DX E11.22 100 each 5  . LANTUS SOLOSTAR 100 UNIT/ML Solostar Pen INJECT 38 UNITS UNDER THE SKIN TWICE A DAY (Patient taking differently: 50 units into the skin two times a day- morning and night) 90 mL 2  . levalbuterol (XOPENEX) 1.25 MG/3ML nebulizer solution Take 1.25 mg by nebulization every 4 (four) hours as needed for wheezing. 72 mL 0  . lidocaine (XYLOCAINE) 2 % solution USE 20 MLS IN THE MOUTH OR THROAT AS NEEDED FOR MOUTH PAIN AS DIRECTED 100 mL 0  . LORazepam (ATIVAN) 1 MG tablet 1 qam  2   qhs (Patient taking differently: Take 1-2 mg by mouth See admin instructions. 1 mg in the morning and 2 mg at bedtime) 90 tablet 5  . metoprolol tartrate (LOPRESSOR) 100 MG tablet TAKE 1 TABLET TWICE A DAY (Patient taking differently: Take 100 mg by mouth two times a day) 180 tablet 3  . metoprolol tartrate (  LOPRESSOR) 25 MG tablet TAKE 1 TABLET TWICE A DAY (TO BE TAKEN ALONG WITH THE LOPRESSOR 100 MG TWICE A DAY) (Patient taking differently: Take 25 mg by mouth two times a day) 180 tablet 3  . NARCAN 4 MG/0.1ML LIQD nasal spray kit Place 1 spray into the nose once as needed (AS DIRECTED).     Marland Kitchen oxyCODONE-acetaminophen (PERCOCET) 7.5-325 MG tablet Take 1 tablet by mouth every 6 (six) hours.    . pregabalin (LYRICA) 150 MG capsule Take 1 capsule (150 mg total) by mouth 2 (two) times daily. 180 capsule 1  . rizatriptan (MAXALT-MLT) 10 MG disintegrating tablet DISSOLVE 1 TABLET UNDER THE TONGUE IMMEDIATELY, MAY REPEAT DOSE IN 1 HOUR IF NEEDED. MAX 3 TABLETS IN 24 HOURS 10 tablet 1  . SUMAtriptan (IMITREX) 100 MG  tablet TAKE 1 TABLET BY MOUTH AT ONSET OF HEADACHE. MAY REPEAT IN 2 HOURS IF HEADACHE PERSISTS OR RECURS.LIMIT 2 PER 24 HOURS 10 tablet 5  . temazepam (RESTORIL) 15 MG capsule 2  qhs (Patient taking differently: Take 30 mg by mouth at bedtime. ) 60 capsule 3  . tizanidine (ZANAFLEX) 6 MG capsule TAKE 1 CAPSULE THREE TIMES A DAY AS NEEDED FOR MUSCLE SPASMS (Patient taking differently: Take 6 mg by mouth 3 (three) times daily as needed for muscle spasms. ) 90 capsule 3  . TRICOR 145 MG tablet TAKE 1 TABLET DAILY (Patient taking differently: Take 145 mg by mouth once daily) 90 tablet 0   No current facility-administered medications for this visit.     Past Medical History:  Diagnosis Date  . Atrial fibrillation ([place])   . Bipolar affective ([place])   . CHF (congestive heart failure) ([place])   . Chronic back pain   . Diabetes mellitus without complication ([place])   . Hyperlipidemia   . Hypertension   . Pain management   . Renal insufficiency     Past Surgical History:  Procedure Laterality Date  . BREAST REDUCTION SURGERY    . EYE SURGERY Right    cateracts  . SHOULDER SURGERY Right   . THIGH SURGERY      Social History   Socioeconomic History  . Marital status: Married    Spouse name: Not on file  . Number of children: 3  . Years of education: Not on file  . Highest education level: Not on file  Social Needs  . Financial resource strain: Not on file  . Food insecurity - worry: Not on file  . Food insecurity - inability: Not on file  . Transportation needs - medical: Not on file  . Transportation needs - non-medical: Not on file  Occupational History  . Not on file  Tobacco Use  . Smoking status: Never Smoker  . Smokeless tobacco: Never Used  Substance and Sexual Activity  . Alcohol use: No    Alcohol/week: 0.0 oz  . Drug use: No  . Sexual activity: Yes    Partners: Male    Birth control/protection: Post-menopausal  Other Topics Concern  . Not on file  Social History  Narrative   Lives at home with husband.      Vitals:   06/26/17 1453  BP: 118/84  Pulse: 65  SpO2: 95%  Weight: 205 lb (93 kg)  Height: '5\' 2"'$  (1.575 m)    Wt Readings from Last 3 Encounters:  06/26/17 205 lb (93 kg)  06/08/17 198 lb 6.4 oz (90 kg)  05/27/17 195 lb (88.5 kg)     PHYSICAL EXAM  General: NAD HEENT: Normal. Neck: No JVD, no thyromegaly. Lungs: Clear to auscultation bilaterally with normal respiratory effort. CV: Regular rate and irregular rhythm, normal S1/S2, no S3, no murmur. No pretibial or periankle edema.  No carotid bruit.   Abdomen: Soft, nontender, no distention.  Neurologic: Alert and oriented.  Psych: Normal affect. Skin: Normal. Musculoskeletal: No gross deformities.    ECG: Most recent ECG reviewed.   Labs: Lab Results  Component Value Date/Time   K 4.1 05/28/2017 10:04 AM   BUN 24 (H) 05/28/2017 10:04 AM   BUN 26 05/27/2017 01:19 PM   CREATININE 1.16 (H) 05/28/2017 10:04 AM   ALT 16 05/28/2017 10:04 AM   TSH 1.922 05/19/2017 01:18 PM   TSH 3.550 01/04/2015 10:28 AM   HGB 14.7 05/28/2017 10:04 AM   HGB 14.2 05/27/2017 01:19 PM     Lipids: Lab Results  Component Value Date/Time   LDLCALC 71 01/04/2015 10:28 AM   LDLDIRECT 62 03/17/2016 04:14 PM   CHOL 163 01/04/2015 10:28 AM   TRIG 249 (H) 01/04/2015 10:28 AM   HDL 42 01/04/2015 10:28 AM       ASSESSMENT AND PLAN:  1. Permanent atrial fibrillation: She has been taking long acting diltiazem 180 mg daily but took her last dose yesterday.  She is also taking metoprolol 125 mg twice daily.  I will reduce long-acting diltiazem to 120 mg daily.  Anticoagulated with apixaban.  2. Chronic diastolic heart failure:  Euvolemic and stable.  Currently takes Lasix 20 mg twice daily.  3. Hypertension: Controlled. No changes.  4. Hyperlipidemia: Continue Lipitor 40 mg.     Disposition: Follow up 6 months   Kate Sable, M.D., F.A.C.C.

## 2017-06-26 NOTE — Addendum Note (Signed)
Addended by: Barbarann Ehlers A on: 06/26/2017 03:26 PM   Modules accepted: Orders

## 2017-06-29 DIAGNOSIS — M5136 Other intervertebral disc degeneration, lumbar region: Secondary | ICD-10-CM | POA: Diagnosis not present

## 2017-06-29 DIAGNOSIS — Z79899 Other long term (current) drug therapy: Secondary | ICD-10-CM | POA: Diagnosis not present

## 2017-06-30 DIAGNOSIS — K52838 Other microscopic colitis: Secondary | ICD-10-CM | POA: Diagnosis not present

## 2017-06-30 DIAGNOSIS — R1084 Generalized abdominal pain: Secondary | ICD-10-CM | POA: Diagnosis not present

## 2017-07-14 DIAGNOSIS — Z79899 Other long term (current) drug therapy: Secondary | ICD-10-CM | POA: Diagnosis not present

## 2017-07-14 DIAGNOSIS — Z1159 Encounter for screening for other viral diseases: Secondary | ICD-10-CM | POA: Diagnosis not present

## 2017-07-14 DIAGNOSIS — I10 Essential (primary) hypertension: Secondary | ICD-10-CM | POA: Diagnosis not present

## 2017-07-14 DIAGNOSIS — E559 Vitamin D deficiency, unspecified: Secondary | ICD-10-CM | POA: Diagnosis not present

## 2017-07-14 DIAGNOSIS — N183 Chronic kidney disease, stage 3 (moderate): Secondary | ICD-10-CM | POA: Diagnosis not present

## 2017-07-14 DIAGNOSIS — D509 Iron deficiency anemia, unspecified: Secondary | ICD-10-CM | POA: Diagnosis not present

## 2017-07-14 DIAGNOSIS — R809 Proteinuria, unspecified: Secondary | ICD-10-CM | POA: Diagnosis not present

## 2017-07-15 ENCOUNTER — Ambulatory Visit (INDEPENDENT_AMBULATORY_CARE_PROVIDER_SITE_OTHER): Payer: Medicare Other | Admitting: Psychiatry

## 2017-07-15 ENCOUNTER — Encounter (HOSPITAL_COMMUNITY): Payer: Self-pay | Admitting: Psychiatry

## 2017-07-15 VITALS — BP 120/74 | HR 94 | Ht 62.0 in | Wt 211.0 lb

## 2017-07-15 DIAGNOSIS — Z818 Family history of other mental and behavioral disorders: Secondary | ICD-10-CM | POA: Diagnosis not present

## 2017-07-15 DIAGNOSIS — F3170 Bipolar disorder, currently in remission, most recent episode unspecified: Secondary | ICD-10-CM | POA: Diagnosis not present

## 2017-07-15 MED ORDER — CARBAMAZEPINE 200 MG PO TABS: 200.0000 mg | ORAL_TABLET | Freq: Three times a day (TID) | ORAL | 2 refills | Status: DC

## 2017-07-15 MED ORDER — TRAZODONE HCL 100 MG PO TABS: ORAL_TABLET | ORAL | 2 refills | Status: DC

## 2017-07-15 MED ORDER — LORAZEPAM 1 MG PO TABS: 1.0000 mg | ORAL_TABLET | ORAL | 3 refills | Status: DC

## 2017-07-15 MED ORDER — ARIPIPRAZOLE 10 MG PO TABS: ORAL_TABLET | ORAL | 2 refills | Status: DC

## 2017-07-15 NOTE — Progress Notes (Signed)
Patient ID: Amber Stephenson, female   DOB: 05-13-1946, 72 y.o.   MRN: [id number] [facility] MD Progress Note  07/15/2017 1:42 PM Amber Stephenson  MRN:  [id number] Subjective:  Doing well Principal Problem: Bipolar disorder most recent episode manic Diagnosis:  Bipolar disorder most recent  Today the patient is seen with her husband, Orpah Greek. The patient is not doing well. Her pain doctor has decided that she is on too many opiates together with benzodiazepines. Therefore they have reduced her Percocet. The patient has chronic back pain that she's had for over 5 years to taking a fixed dose of Percocet with success. Now her Percocet is reduced and she's having more pain. Unrelated to the pain however is the fact that she's now not sleeping well at all. She would normally sleeping 5-6 hours. Now she sleeping only a few hours. State your long-time fall asleep but most importantly she gets up in the middle the night and eats. She's gained weight. She feels sleepy all day and often takes naps.Overall her appetite is stable. Her energy level is only fair. She's having some problems thinking concentrating. The patient denies anhedonia. She is a good sense of worth. She denies being suicidal. She denies any symptoms of mania. She's not for nor she your the patient denies racing thinking.She's not grandiose her self-esteem is within normal limits. The patient drinks no alcohol uses no drugs. It is noted the patient had her Ativan increase to one in the morning and 2 at night which has not worked for her sleep. Her anxiety is reasonably well-controlled. She's taking Restoril in the past which did not work. She's taking Lunesta and Ambien complications from these agents. She's never been on Desyrel or dalm.ane. Overall her biggest complaint is her problems sleeping. She believes that she could sleep better she would do a lot more during the day she will allow her functional. An concern of her being sleep deprived given her bipolar  disorder. This patient is not suicidal. Overall she is functioning fairly well. Past Medical History:  Past Medical History:  Diagnosis Date  . Atrial fibrillation ([place])   . Bipolar affective ([place])   . CHF (congestive heart failure) ([place])   . Chronic back pain   . Diabetes mellitus without complication ([place])   . Hyperlipidemia   . Hypertension   . Pain management   . Renal insufficiency     Past Surgical History:  Procedure Laterality Date  . BREAST REDUCTION SURGERY    . EYE SURGERY Right    cateracts  . SHOULDER SURGERY Right   . THIGH SURGERY     Family History:  Family History  Problem Relation Age of Onset  . Diabetes Mother   . Heart disease Mother   . Heart disease Brother   . Heart disease Sister        CABG  . Mental illness Brother   . Diabetes Brother    Family Psychiatric  History:  Social History:  Social History   Substance and Sexual Activity  Alcohol Use No  . Alcohol/week: 0.0 oz     Social History   Substance and Sexual Activity  Drug Use No    Social History   Socioeconomic History  . Marital status: Married    Spouse name: None  . Number of children: 3  . Years of education: None  . Highest education level: None  Social Needs  . Financial resource strain: None  . Food insecurity - worry: None  .  Food insecurity - inability: None  . Transportation needs - medical: None  . Transportation needs - non-medical: None  Occupational History  . None  Tobacco Use  . Smoking status: Never Smoker  . Smokeless tobacco: Never Used  Substance and Sexual Activity  . Alcohol use: No    Alcohol/week: 0.0 oz  . Drug use: No  . Sexual activity: Yes    Partners: Male    Birth control/protection: Post-menopausal  Other Topics Concern  . None  Social History Narrative   Lives at home with husband.    Additional Social History:                         Sleep: Good  Appetite:  Good  Current Medications: Current Outpatient  Medications  Medication Sig Dispense Refill  . apixaban (ELIQUIS) 5 MG TABS tablet Take 5 mg by mouth 2 (two) times daily.    . ARIPiprazole (ABILIFY) 10 MG tablet 3 qam (Patient taking differently: Take 30 mg by mouth every morning. ) 270 tablet 3  . atorvastatin (LIPITOR) 40 MG tablet TAKE 1 TABLET DAILY (Patient taking differently: Take 40 mg by mouth once a day) 90 tablet 1  . Blood Glucose Monitoring Suppl (FREESTYLE LITE) DEVI Use to check blood sugar tid. DX E11.22 1 each 0  . budesonide (ENTOCORT EC) 3 MG 24 hr capsule Take 9 mg by mouth every morning.     . Cholecalciferol (VITAMIN D3) 1000 UNITS CAPS Take 1,000 Units by mouth daily.    Marland Kitchen diltiazem (CARDIZEM CD) 120 MG 24 hr capsule Take 1 capsule (120 mg total) by mouth daily. 90 capsule 3  . Dulaglutide (TRULICITY) 4.82 NO/0.3BC SOPN Inject 0.75 mg into the skin once a week. 12 pen 3  . FREESTYLE LITE test strip USE FOR TESTING SUGAR THREE TIMES DAILY 100 each 3  . furosemide (LASIX) 20 MG tablet TAKE 1 TABLET TWICE A DAY 180 tablet 1  . Insulin Pen Needle (SURE COMFORT PEN NEEDLES) 32G X 4 MM MISC Use with insulin pens as directed. Dx E11.9 300 each 11  . ipratropium-albuterol (DUONEB) 0.5-2.5 (3) MG/3ML SOLN Take 3 mLs by nebulization every 4 (four) hours as needed.    . Lancets (FREESTYLE) lancets Test tid. DX E11.22 100 each 5  . LANTUS SOLOSTAR 100 UNIT/ML Solostar Pen INJECT 38 UNITS UNDER THE SKIN TWICE A DAY (Patient taking differently: 50 units into the skin two times a day- morning and night) 90 mL 2  . levalbuterol (XOPENEX) 1.25 MG/3ML nebulizer solution Take 1.25 mg by nebulization every 4 (four) hours as needed for wheezing. 72 mL 0  . LORazepam (ATIVAN) 1 MG tablet Take 1-2 tablets (1-2 mg total) by mouth See admin instructions. 1 mg in the morning and 2 mg at bedtime 90 tablet 3  . metoprolol tartrate (LOPRESSOR) 100 MG tablet TAKE 1 TABLET TWICE A DAY (Patient taking differently: Take 100 mg by mouth two times a day) 180  tablet 3  . metoprolol tartrate (LOPRESSOR) 25 MG tablet TAKE 1 TABLET TWICE A DAY (TO BE TAKEN ALONG WITH THE LOPRESSOR 100 MG TWICE A DAY) (Patient taking differently: Take 25 mg by mouth two times a day) 180 tablet 3  . NARCAN 4 MG/0.1ML LIQD nasal spray kit Place 1 spray into the nose once as needed (AS DIRECTED).     Marland Kitchen oxyCODONE-acetaminophen (PERCOCET) 7.5-325 MG tablet Take 1 tablet by mouth every 6 (six) hours.    Marland Kitchen  pregabalin (LYRICA) 150 MG capsule Take 1 capsule (150 mg total) by mouth 2 (two) times daily. 180 capsule 1  . rizatriptan (MAXALT-MLT) 10 MG disintegrating tablet DISSOLVE 1 TABLET UNDER THE TONGUE IMMEDIATELY, MAY REPEAT DOSE IN 1 HOUR IF NEEDED. MAX 3 TABLETS IN 24 HOURS 10 tablet 1  . SUMAtriptan (IMITREX) 100 MG tablet TAKE 1 TABLET BY MOUTH AT ONSET OF HEADACHE. MAY REPEAT IN 2 HOURS IF HEADACHE PERSISTS OR RECURS.LIMIT 2 PER 24 HOURS 10 tablet 5  . tizanidine (ZANAFLEX) 6 MG capsule TAKE 1 CAPSULE THREE TIMES A DAY AS NEEDED FOR MUSCLE SPASMS (Patient taking differently: Take 6 mg by mouth 3 (three) times daily as needed for muscle spasms. ) 90 capsule 3  . TRICOR 145 MG tablet TAKE 1 TABLET DAILY (Patient taking differently: Take 145 mg by mouth once daily) 90 tablet 0  . ARIPiprazole (ABILIFY) 10 MG tablet TAKE 3 TABLETS EVERY MORNING 270 tablet 2  . carbamazepine (TEGRETOL) 200 MG tablet Take 1 tablet (200 mg total) by mouth 3 (three) times daily. 30 tablet 2  . clotrimazole (LOTRIMIN) 1 % cream APPLY EXTERNALLY TO THE AFFECTED AREA TWICE DAILY (Patient not taking: Reported on 07/15/2017) 30 g 2  . lidocaine (XYLOCAINE) 2 % solution USE 20 MLS IN THE MOUTH OR THROAT AS NEEDED FOR MOUTH PAIN AS DIRECTED (Patient not taking: Reported on 07/15/2017) 100 mL 0  . temazepam (RESTORIL) 15 MG capsule 2  qhs (Patient not taking: Reported on 07/15/2017) 60 capsule 3  . traZODone (DESYREL) 100 MG tablet 1  qhs and if not effective after 2 nights  Increase to 2  qhs  and  After 2  Days  not effective 3  qhs 90 tablet 2   No current facility-administered medications for this visit.     Lab Results: No results found for this or any previous visit (from the past 48 hour(s)).  Blood Alcohol level:  Lab Results  Component Value Date   ETH <5 10/01/2015    Physical Findings: AIMS:  , ,  ,  ,    CIWA:    COWS:     Musculoskeletal: Strength & Muscle Tone: within normal limits Gait & Station: normal Patient leans: N/A  Psychiatric Specialty Exam: ROS  Blood pressure 120/74, pulse 94, height 5' 2"  (1.575 m), weight 211 lb (95.7 kg), SpO2 96 %.Body mass index is 38.59 kg/m.  General Appearance: Casual  Eye Contact::  Good  Speech:  Clear and Coherent  Volume:  Normal  Mood:  Euthymic  Affect:  Appropriate  Thought Process:  Coherent  Orientation:  Full (Time, Place, and Person)  Thought Content:  WDL  Suicidal Thoughts:  No  Homicidal Thoughts:  No  Memory:  NA  Judgement:  Good  Insight:  Good  Psychomotor Activity:  Normal  Concentration:  Good  Recall:  Good  Fund of Knowledge:Good  Language: Good  Akathisia:  No  Handed:  Right  AIMS (if indicated):     Assets: Good spirits   ADL's:  Intact  Cognition: WNL  Sleep:      Treatment Plan Summary: At this time the patient will continue taking Abilify 30 mg, Tegretol 200 mg continue her Ativan for now is 1 mg in the morning 2 mg at night.Today will begin on trazodone 100 mg and she may increase it after 2 days to 200 mg if she's not sleeping. After 2 more days she still not sleeping she'll go to the maximum dose  300 mg. This patient has an appointment to see her pain doctor the next few weeks we'll discuss the issue of pain. This patient to return to see me in 5 weeks.

## 2017-07-17 ENCOUNTER — Encounter: Payer: Self-pay | Admitting: Neurology

## 2017-07-17 ENCOUNTER — Ambulatory Visit (INDEPENDENT_AMBULATORY_CARE_PROVIDER_SITE_OTHER): Payer: Medicare Other | Admitting: Neurology

## 2017-07-17 VITALS — BP 142/88 | HR 82 | Ht 62.0 in | Wt 212.0 lb

## 2017-07-17 DIAGNOSIS — R413 Other amnesia: Secondary | ICD-10-CM

## 2017-07-17 NOTE — Progress Notes (Addendum)
NEUROLOGY CONSULTATION NOTE  Amber Stephenson MRN: [id number] DOB: [birth year]  Referring provider: Dr. Kenn File Primary care provider: Dr. Kenn File  Reason for consult:  Memory loss  Dear Dr Wendi Snipes:  Thank you for your kind referral of Amber Stephenson for consultation of the above symptoms. Although her history is well known to you, please allow me to reiterate it for the purpose of our medical record. Records and images were personally reviewed where available.  HISTORY OF PRESENT ILLNESS: This is a 72 year old right-handed woman with a history of hypertension, hyperlipidemia, diabetes, atrial fibrillation, chronic pain, bipolar disorder, presenting for evaluation of memory changes. She feels her memory has gotten really bad over the past 2 months. She cannot remember words/word-finding difficulties. She "does not think it's Alzheimers" but feels like she is not paying attention. Family reminds her that they had told her something already previously. Her husband has to remind her of her medications. They put her pills together in a pillbox. Her husband is in charge of finances. She stopped driving 4 years ago, she denied getting lost when she used to drive. She started noticing memory changes after she was started on a medication after she had her colonoscopy. She has also been dealing with a lot of pain, she states her Percocet dose was cut in half a month ago and this is causing her a lot of issues. She has poor sleep, 3 hours at the most. She was recently started on Trazodone by her psychiatrist, which may be helping. Her father had Alzheimer's disease. She denies any history of significant head injuries, no alcohol use.  She has frequent headaches, around 20 headache days a month with stabbing pain in the frontal region that can last up to 3 days, with associated nausea/vomiting. If she takes medication, pain lasts up to 2 hours. She has dizziness at least 1-2 times a day  where it feels like the room is spinning. She has had falls for "no clear reason," last fall was 3 months ago. She has chronic neck and back pain, no focal numbness/tingling/weakness. She has tremors in both hands and has a diagnosis of essential tremor, it is "very difficult to do things," it has affected her handwriting, computer use. No family history of tremors. No bowel/bladder dysfunction or anosmia.   I personally reviewed MRI brain without contrast done 05/13/17 which did not show any acute changes. There was mild diffuse atrophy and mild to moderate chronic microvascular disease.  Laboratory Data: Lab Results  Component Value Date   TSH 1.922 05/19/2017   Lab Results  Component Value Date   HGBA1C 9.5 (H) 10/01/2015    PAST MEDICAL HISTORY: Past Medical History:  Diagnosis Date  . Atrial fibrillation ([place])   . Bipolar affective ([place])   . CHF (congestive heart failure) ([place])   . Chronic back pain   . Diabetes mellitus without complication ([place])   . Hyperlipidemia   . Hypertension   . Pain management   . Renal insufficiency     PAST SURGICAL HISTORY: Past Surgical History:  Procedure Laterality Date  . BREAST REDUCTION SURGERY    . EYE SURGERY Right    cateracts  . SHOULDER SURGERY Right   . THIGH SURGERY      MEDICATIONS: Current Outpatient Medications on File Prior to Visit  Medication Sig Dispense Refill  . apixaban (ELIQUIS) 5 MG TABS tablet Take 5 mg by mouth 2 (two) times daily.    . ARIPiprazole (ABILIFY) 10  MG tablet 3 qam (Patient taking differently: Take 30 mg by mouth every morning. ) 270 tablet 3  . ARIPiprazole (ABILIFY) 10 MG tablet TAKE 3 TABLETS EVERY MORNING 270 tablet 2  . atorvastatin (LIPITOR) 40 MG tablet TAKE 1 TABLET DAILY (Patient taking differently: Take 40 mg by mouth once a day) 90 tablet 1  . Blood Glucose Monitoring Suppl (FREESTYLE LITE) DEVI Use to check blood sugar tid. DX E11.22 1 each 0  . budesonide (ENTOCORT EC) 3 MG 24 hr  capsule Take 9 mg by mouth every morning.     . carbamazepine (TEGRETOL) 200 MG tablet Take 1 tablet (200 mg total) by mouth 3 (three) times daily. 30 tablet 2  . Cholecalciferol (VITAMIN D3) 1000 UNITS CAPS Take 1,000 Units by mouth daily.    . clotrimazole (LOTRIMIN) 1 % cream APPLY EXTERNALLY TO THE AFFECTED AREA TWICE DAILY (Patient not taking: Reported on 07/15/2017) 30 g 2  . diltiazem (CARDIZEM CD) 120 MG 24 hr capsule Take 1 capsule (120 mg total) by mouth daily. 90 capsule 3  . Dulaglutide (TRULICITY) 3.84 YK/5.9DJ SOPN Inject 0.75 mg into the skin once a week. 12 pen 3  . FREESTYLE LITE test strip USE FOR TESTING SUGAR THREE TIMES DAILY 100 each 3  . furosemide (LASIX) 20 MG tablet TAKE 1 TABLET TWICE A DAY 180 tablet 1  . Insulin Pen Needle (SURE COMFORT PEN NEEDLES) 32G X 4 MM MISC Use with insulin pens as directed. Dx E11.9 300 each 11  . ipratropium-albuterol (DUONEB) 0.5-2.5 (3) MG/3ML SOLN Take 3 mLs by nebulization every 4 (four) hours as needed.    . Lancets (FREESTYLE) lancets Test tid. DX E11.22 100 each 5  . LANTUS SOLOSTAR 100 UNIT/ML Solostar Pen INJECT 38 UNITS UNDER THE SKIN TWICE A DAY (Patient taking differently: 50 units into the skin two times a day- morning and night) 90 mL 2  . levalbuterol (XOPENEX) 1.25 MG/3ML nebulizer solution Take 1.25 mg by nebulization every 4 (four) hours as needed for wheezing. 72 mL 0  . lidocaine (XYLOCAINE) 2 % solution USE 20 MLS IN THE MOUTH OR THROAT AS NEEDED FOR MOUTH PAIN AS DIRECTED (Patient not taking: Reported on 07/15/2017) 100 mL 0  . LORazepam (ATIVAN) 1 MG tablet Take 1-2 tablets (1-2 mg total) by mouth See admin instructions. 1 mg in the morning and 2 mg at bedtime 90 tablet 3  . metoprolol tartrate (LOPRESSOR) 100 MG tablet TAKE 1 TABLET TWICE A DAY (Patient taking differently: Take 100 mg by mouth two times a day) 180 tablet 3  . metoprolol tartrate (LOPRESSOR) 25 MG tablet TAKE 1 TABLET TWICE A DAY (TO BE TAKEN ALONG WITH THE  LOPRESSOR 100 MG TWICE A DAY) (Patient taking differently: Take 25 mg by mouth two times a day) 180 tablet 3  . NARCAN 4 MG/0.1ML LIQD nasal spray kit Place 1 spray into the nose once as needed (AS DIRECTED).     Marland Kitchen oxyCODONE-acetaminophen (PERCOCET) 7.5-325 MG tablet Take 1 tablet by mouth every 6 (six) hours.    . pregabalin (LYRICA) 150 MG capsule Take 1 capsule (150 mg total) by mouth 2 (two) times daily. 180 capsule 1  . rizatriptan (MAXALT-MLT) 10 MG disintegrating tablet DISSOLVE 1 TABLET UNDER THE TONGUE IMMEDIATELY, MAY REPEAT DOSE IN 1 HOUR IF NEEDED. MAX 3 TABLETS IN 24 HOURS 10 tablet 1  . SUMAtriptan (IMITREX) 100 MG tablet TAKE 1 TABLET BY MOUTH AT ONSET OF HEADACHE. MAY REPEAT IN 2 HOURS  IF HEADACHE PERSISTS OR RECURS.LIMIT 2 PER 24 HOURS 10 tablet 5  . temazepam (RESTORIL) 15 MG capsule 2  qhs (Patient not taking: Reported on 07/15/2017) 60 capsule 3  . tizanidine (ZANAFLEX) 6 MG capsule TAKE 1 CAPSULE THREE TIMES A DAY AS NEEDED FOR MUSCLE SPASMS (Patient taking differently: Take 6 mg by mouth 3 (three) times daily as needed for muscle spasms. ) 90 capsule 3  . traZODone (DESYREL) 100 MG tablet 1  qhs and if not effective after 2 nights  Increase to 2  qhs  and  After 2  Days not effective 3  qhs 90 tablet 2  . TRICOR 145 MG tablet TAKE 1 TABLET DAILY (Patient taking differently: Take 145 mg by mouth once daily) 90 tablet 0   No current facility-administered medications on file prior to visit.     ALLERGIES: Allergies  Allergen Reactions  . Ivp Dye [Iodinated Diagnostic Agents] Anaphylaxis and Swelling    Throat closes    FAMILY HISTORY: Family History  Problem Relation Age of Onset  . Diabetes Mother   . Heart disease Mother   . Heart disease Brother   . Heart disease Sister        CABG  . Mental illness Brother   . Diabetes Brother     SOCIAL HISTORY: Social History   Socioeconomic History  . Marital status: Married    Spouse name: Not on file  . Number of  children: 3  . Years of education: Not on file  . Highest education level: Not on file  Social Needs  . Financial resource strain: Not on file  . Food insecurity - worry: Not on file  . Food insecurity - inability: Not on file  . Transportation needs - medical: Not on file  . Transportation needs - non-medical: Not on file  Occupational History  . Not on file  Tobacco Use  . Smoking status: Never Smoker  . Smokeless tobacco: Never Used  Substance and Sexual Activity  . Alcohol use: No    Alcohol/week: 0.0 oz  . Drug use: No  . Sexual activity: Yes    Partners: Male    Birth control/protection: Post-menopausal  Other Topics Concern  . Not on file  Social History Narrative   Lives at home with husband.     REVIEW OF SYSTEMS: Constitutional: No fevers, chills, or sweats, no generalized fatigue, change in appetite Eyes: No visual changes, double vision, eye pain Ear, nose and throat: No hearing loss, ear pain, nasal congestion, sore throat Cardiovascular: No chest pain, palpitations Respiratory:  No shortness of breath at rest or with exertion, wheezes GastrointestinaI: No nausea, vomiting, diarrhea, abdominal pain, fecal incontinence Genitourinary:  No dysuria, urinary retention or frequency Musculoskeletal:  + neck pain, back pain Integumentary: No rash, pruritus, skin lesions Neurological: as above Psychiatric: + depression, insomnia, anxiety Endocrine: No palpitations, fatigue, diaphoresis, mood swings, change in appetite, change in weight, increased thirst Hematologic/Lymphatic:  No anemia, purpura, petechiae. Allergic/Immunologic: no itchy/runny eyes, nasal congestion, recent allergic reactions, rashes  PHYSICAL EXAM: Vitals:   07/17/17 1457  BP: (!) 142/88  Pulse: 82   General: No acute distress Head:  Normocephalic/atraumatic Eyes: Fundoscopic exam shows bilateral sharp discs, no vessel changes, exudates, or hemorrhages Neck: supple, + paraspinal tenderness,  full range of motion Back: + paraspinal tenderness Heart: regular rate and rhythm Lungs: Clear to auscultation bilaterally. Vascular: No carotid bruits. Skin/Extremities: No rash, no edema Neurological Exam: Mental status: alert and oriented to person,  place, and time, no dysarthria or aphasia, Fund of knowledge is appropriate.  Recent and remote memory are intact.  Attention and concentration are normal.    Able to name objects and repeat phrases. CDT 5/5. 6 words starting with F in 1 minute (nl > 11). MMSE - Mini Mental State Exam 04/29/2017  Orientation to time 5  Orientation to Place 4  Registration 3  Attention/ Calculation 5  Recall 3  Language- name 2 objects 2  Language- repeat 1  Language- follow 3 step command 3  Language- read & follow direction 1  Write a sentence 1  Copy design 1  Total score 29   Cranial nerves: CN I: not tested CN II: pupils equal, round and reactive to light, visual fields intact, fundi unremarkable. CN III, IV, VI:  full range of motion, no nystagmus, no ptosis CN V: facial sensation intact CN VII: upper and lower face symmetric CN VIII: hearing intact to finger rub CN IX, X: gag intact, uvula midline CN XI: sternocleidomastoid and trapezius muscles intact CN XII: tongue midline Bulk & Tone: normal, no cogwheeling, no fasciculations. Motor: 5/5 throughout with no pronator drift, reports right shoulder pain on arm elevation Sensation: intact to light touch, cold, pin, vibration and joint position sense.  No extinction to double simultaneous stimulation.  Romberg test negative Deep Tendon Reflexes: brisk +2 throughout, no ankle clonus, negative Hoffman sign Plantar responses: downgoing bilaterally Cerebellar: no incoordination on finger to nose, heel to shin. No dysdiadochokinesia Gait: slow and cautious, wide-based, reporting back pain with ambulation, unable to tandem walk Tremor: none in office today  IMPRESSION: This is a 72 year old  right-handed woman with a history of hypertension, hyperlipidemia, diabetes, atrial fibrillation, chronic pain, bipolar disorder, presenting for evaluation of memory changes. Her neurological exam is non-focal, she is reporting a lot of pain issues. MMSE today normal 29/30. We discussed different causes of memory loss. Check B12. MRI brain no acute changes. We discussed how chronic pain, poor sleep can affect our cognition. We also discussed the importance of control of vascular risk factors, her last HbA1c remained elevated. There is no indication to start cholinesterase inhibitors at this time. We discussed continued follow-up with her pain specialist and psychiatrist. We may consider Neuropsychological evaluation in the future if co-morbid factors are better controlled and she still has cognitive issues. We discussed the importance of control of vascular risk factors, physical exercise, and brain stimulation exercises for brain health. She will follow-up in 6 months for re-evaluation and knows to call for any changes.   Thank you for allowing me to participate in the care of this patient. Please do not hesitate to call for any questions or concerns.   Ellouise Newer, M.D.  CC: Dr. Wendi Snipes

## 2017-07-17 NOTE — Patient Instructions (Addendum)
1. Bloodwork for B12 level  Your provider has requested that you have labwork completed today. Please go to [facility] ([id number]) on the second floor of this building before leaving the office today. You do not need to check in. If you are not called within 15 minutes please check with the front desk.   2. Continue working with your Pain specialist, psychiatrist, and medical doctors for all the conditions we have talked about that can affect memory (pain, poor sleep, poorly controlled diabetes) 3. Follow-up in 6 months, call for any changes  RECOMMENDATIONS FOR ALL PATIENTS WITH MEMORY PROBLEMS: 1. Continue to exercise (Recommend 30 minutes of walking everyday, or 3 hours every week) 2. Increase social interactions - continue going to [place] and enjoy social gatherings with friends and family 3. Eat healthy, avoid fried foods and eat more fruits and vegetables 4. Maintain adequate blood pressure, blood sugar, and blood cholesterol level. Reducing the risk of stroke and cardiovascular disease also helps promoting better memory. 5. Avoid stressful situations. Live a simple life and avoid aggravations. Organize your time and prepare for the next day in anticipation. 6. Sleep well, avoid any interruptions of sleep and avoid any distractions in the bedroom that may interfere with adequate sleep quality 7. Avoid sugar, avoid sweets as there is a strong link between excessive sugar intake, diabetes, and cognitive impairment We discussed the Mediterranean diet, which has been shown to help patients reduce the risk of progressive memory disorders and reduces cardiovascular risk. This includes eating fish, eat fruits and green leafy vegetables, nuts like almonds and hazelnuts, walnuts, and also use olive oil. Avoid fast foods and fried foods as much as possible. Avoid sweets and sugar as sugar use has been linked to worsening of memory function.

## 2017-07-20 ENCOUNTER — Encounter: Payer: Self-pay | Admitting: Neurology

## 2017-07-20 ENCOUNTER — Telehealth: Payer: Self-pay | Admitting: Cardiovascular Disease

## 2017-07-20 DIAGNOSIS — N179 Acute kidney failure, unspecified: Secondary | ICD-10-CM | POA: Diagnosis not present

## 2017-07-20 DIAGNOSIS — E559 Vitamin D deficiency, unspecified: Secondary | ICD-10-CM | POA: Diagnosis not present

## 2017-07-20 DIAGNOSIS — R809 Proteinuria, unspecified: Secondary | ICD-10-CM | POA: Diagnosis not present

## 2017-07-20 DIAGNOSIS — N183 Chronic kidney disease, stage 3 (moderate): Secondary | ICD-10-CM | POA: Diagnosis not present

## 2017-07-20 DIAGNOSIS — E1129 Type 2 diabetes mellitus with other diabetic kidney complication: Secondary | ICD-10-CM | POA: Diagnosis not present

## 2017-07-20 NOTE — Telephone Encounter (Signed)
Patient is having breathing issues with no other symptoms and wanted to see Dr Bronson Ing asap.  Offered appt in [place] but she declined.  She wanted nurse to contact her

## 2017-07-20 NOTE — Telephone Encounter (Signed)
Patient states the last 3 days she has become very short of breath. Patient states she has noticed a 10 pound weight gain. Patient has not noticed any swelling and does not take it regularly. Patient is supposed to take lasix 20 mg BID which she has only been doing for the last 2 days. Patient notices worsening of chest pain when she lays down. Patient has appt schedule for 2/20. Patient does not want to go to the ER. Advised patient if she developed any other symptoms she needs to go to the ER. Patient verbalized understanding.

## 2017-07-22 ENCOUNTER — Ambulatory Visit (INDEPENDENT_AMBULATORY_CARE_PROVIDER_SITE_OTHER): Payer: Medicare Other | Admitting: Cardiovascular Disease

## 2017-07-22 ENCOUNTER — Ambulatory Visit (INDEPENDENT_AMBULATORY_CARE_PROVIDER_SITE_OTHER): Payer: Medicare Other | Admitting: Family Medicine

## 2017-07-22 ENCOUNTER — Other Ambulatory Visit: Payer: Self-pay

## 2017-07-22 ENCOUNTER — Encounter: Payer: Self-pay | Admitting: Family Medicine

## 2017-07-22 ENCOUNTER — Encounter: Payer: Self-pay | Admitting: Cardiovascular Disease

## 2017-07-22 VITALS — BP 106/80 | HR 71 | Ht 62.0 in | Wt 207.4 lb

## 2017-07-22 VITALS — BP 126/85 | HR 81 | Temp 97.4°F | Ht 62.0 in | Wt 210.2 lb

## 2017-07-22 DIAGNOSIS — R0601 Orthopnea: Secondary | ICD-10-CM | POA: Diagnosis not present

## 2017-07-22 DIAGNOSIS — I10 Essential (primary) hypertension: Secondary | ICD-10-CM

## 2017-07-22 DIAGNOSIS — E782 Mixed hyperlipidemia: Secondary | ICD-10-CM | POA: Diagnosis not present

## 2017-07-22 DIAGNOSIS — I482 Chronic atrial fibrillation: Secondary | ICD-10-CM | POA: Diagnosis not present

## 2017-07-22 DIAGNOSIS — I5032 Chronic diastolic (congestive) heart failure: Secondary | ICD-10-CM

## 2017-07-22 DIAGNOSIS — R413 Other amnesia: Secondary | ICD-10-CM

## 2017-07-22 DIAGNOSIS — M79671 Pain in right foot: Secondary | ICD-10-CM | POA: Diagnosis not present

## 2017-07-22 DIAGNOSIS — I4821 Permanent atrial fibrillation: Secondary | ICD-10-CM

## 2017-07-22 DIAGNOSIS — M79672 Pain in left foot: Secondary | ICD-10-CM | POA: Diagnosis not present

## 2017-07-22 DIAGNOSIS — M797 Fibromyalgia: Secondary | ICD-10-CM | POA: Diagnosis not present

## 2017-07-22 DIAGNOSIS — R0602 Shortness of breath: Secondary | ICD-10-CM | POA: Diagnosis not present

## 2017-07-22 MED ORDER — IPRATROPIUM-ALBUTEROL 20-100 MCG/ACT IN AERS: 1.0000 | INHALATION_SPRAY | Freq: Four times a day (QID) | RESPIRATORY_TRACT | 2 refills | Status: DC

## 2017-07-22 MED ORDER — PREGABALIN 150 MG PO CAPS: 150.0000 mg | ORAL_CAPSULE | Freq: Two times a day (BID) | ORAL | 1 refills | Status: DC

## 2017-07-22 NOTE — Patient Instructions (Signed)
Medication Instructions:   Your physician recommends that you continue on your current medications as directed. Please refer to the Current Medication list given to you today.  Labwork:  NONE  Testing/Procedures:  NONE  Follow-Up:  Your physician recommends that you schedule a follow-up appointment in: as planned previously for July 2019.  Any Other Special Instructions Will Be Listed Below (If Applicable).  If you need a refill on your cardiac medications before your next appointment, please call your pharmacy.

## 2017-07-22 NOTE — Progress Notes (Signed)
SUBJECTIVE: Patient presents for evaluation of shortness of breath.  She called our office on 07/20/17.  She noticed a 10 pound weight gain.  She was supposed to be taking Lasix 20 mg twice daily but only started doing it in the past few days.  She did not want to go to the ED.  When I evaluated her on 06/26/17 for permanent atrial fibrillation and chronic diastolic heart failure, she was doing well.  Her last echocardiogram was in September 2016 and demonstrated normal left ventricular systolic function, LVEF 66-06%.  Nuclear stress test in January 2017 was normal.  She told me that her symptoms began when she manipulated her medications, Lasix in particular.  She said she was up urinating all night so stopped taking her evening dose of Lasix.  She became orthopneic.  She denies chest pain.  She then began taking it at 8 AM and 9:30 PM and feels much better.  She denies orthopnea and leg swelling.  Oxygen saturations are 94%.    Review of Systems: As per "subjective", otherwise negative.  Allergies  Allergen Reactions  . Ivp Dye [Iodinated Diagnostic Agents] Anaphylaxis and Swelling    Throat closes    Current Outpatient Medications  Medication Sig Dispense Refill  . apixaban (ELIQUIS) 5 MG TABS tablet Take 5 mg by mouth 2 (two) times daily.    . ARIPiprazole (ABILIFY) 10 MG tablet TAKE 3 TABLETS EVERY MORNING 270 tablet 2  . atorvastatin (LIPITOR) 40 MG tablet TAKE 1 TABLET DAILY (Patient taking differently: Take 40 mg by mouth once a day) 90 tablet 1  . Blood Glucose Monitoring Suppl (FREESTYLE LITE) DEVI Use to check blood sugar tid. DX E11.22 1 each 0  . budesonide (ENTOCORT EC) 3 MG 24 hr capsule Take 9 mg by mouth every morning.     . carbamazepine (TEGRETOL) 200 MG tablet Take 1 tablet (200 mg total) by mouth 3 (three) times daily. 30 tablet 2  . Cholecalciferol (VITAMIN D3) 1000 UNITS CAPS Take 1,000 Units by mouth daily.    . clotrimazole (LOTRIMIN) 1 % cream APPLY  EXTERNALLY TO THE AFFECTED AREA TWICE DAILY 30 g 2  . diltiazem (CARDIZEM CD) 120 MG 24 hr capsule Take 1 capsule (120 mg total) by mouth daily. 90 capsule 3  . Dulaglutide (TRULICITY) 3.01 SW/1.0XN SOPN Inject 0.75 mg into the skin once a week. 12 pen 3  . FREESTYLE LITE test strip USE FOR TESTING SUGAR THREE TIMES DAILY 100 each 3  . furosemide (LASIX) 20 MG tablet TAKE 1 TABLET TWICE A DAY 180 tablet 1  . Insulin Pen Needle (SURE COMFORT PEN NEEDLES) 32G X 4 MM MISC Use with insulin pens as directed. Dx E11.9 300 each 11  . ipratropium-albuterol (DUONEB) 0.5-2.5 (3) MG/3ML SOLN Take 3 mLs by nebulization every 4 (four) hours as needed.    . Lancets (FREESTYLE) lancets Test tid. DX E11.22 100 each 5  . LANTUS SOLOSTAR 100 UNIT/ML Solostar Pen INJECT 38 UNITS UNDER THE SKIN TWICE A DAY (Patient taking differently: 50 units into the skin two times a day- morning and night) 90 mL 2  . levalbuterol (XOPENEX) 1.25 MG/3ML nebulizer solution Take 1.25 mg by nebulization every 4 (four) hours as needed for wheezing. 72 mL 0  . lidocaine (XYLOCAINE) 2 % solution USE 20 MLS IN THE MOUTH OR THROAT AS NEEDED FOR MOUTH PAIN AS DIRECTED 100 mL 0  . LORazepam (ATIVAN) 1 MG tablet Take 1-2 tablets (1-2  mg total) by mouth See admin instructions. 1 mg in the morning and 2 mg at bedtime 90 tablet 3  . metoprolol tartrate (LOPRESSOR) 100 MG tablet TAKE 1 TABLET TWICE A DAY (Patient taking differently: Take 100 mg by mouth two times a day) 180 tablet 3  . metoprolol tartrate (LOPRESSOR) 25 MG tablet TAKE 1 TABLET TWICE A DAY (TO BE TAKEN ALONG WITH THE LOPRESSOR 100 MG TWICE A DAY) (Patient taking differently: Take 25 mg by mouth two times a day) 180 tablet 3  . NARCAN 4 MG/0.1ML LIQD nasal spray kit Place 1 spray into the nose once as needed (AS DIRECTED).     Marland Kitchen oxyCODONE-acetaminophen (PERCOCET) 7.5-325 MG tablet Take 1 tablet by mouth every 6 (six) hours.    . pregabalin (LYRICA) 150 MG capsule Take 1 capsule (150 mg  total) by mouth 2 (two) times daily. 180 capsule 1  . rizatriptan (MAXALT-MLT) 10 MG disintegrating tablet DISSOLVE 1 TABLET UNDER THE TONGUE IMMEDIATELY, MAY REPEAT DOSE IN 1 HOUR IF NEEDED. MAX 3 TABLETS IN 24 HOURS 10 tablet 1  . SUMAtriptan (IMITREX) 100 MG tablet TAKE 1 TABLET BY MOUTH AT ONSET OF HEADACHE. MAY REPEAT IN 2 HOURS IF HEADACHE PERSISTS OR RECURS.LIMIT 2 PER 24 HOURS 10 tablet 5  . temazepam (RESTORIL) 15 MG capsule 2  qhs 60 capsule 3  . tizanidine (ZANAFLEX) 6 MG capsule TAKE 1 CAPSULE THREE TIMES A DAY AS NEEDED FOR MUSCLE SPASMS (Patient taking differently: Take 6 mg by mouth 3 (three) times daily as needed for muscle spasms. ) 90 capsule 3  . traZODone (DESYREL) 100 MG tablet 1  qhs and if not effective after 2 nights  Increase to 2  qhs  and  After 2  Days not effective 3  qhs 90 tablet 2  . TRICOR 145 MG tablet TAKE 1 TABLET DAILY (Patient taking differently: Take 145 mg by mouth once daily) 90 tablet 0   No current facility-administered medications for this visit.     Past Medical History:  Diagnosis Date  . Atrial fibrillation ([place])   . Bipolar affective ([name])   . CHF (congestive heart failure) ([place])   . Chronic back pain   . Diabetes mellitus without complication ([place])   . Hyperlipidemia   . Hypertension   . Pain management   . Renal insufficiency     Past Surgical History:  Procedure Laterality Date  . BREAST REDUCTION SURGERY    . EYE SURGERY Right    cateracts  . HAMMER TOE SURGERY    . SHOULDER SURGERY Right   . THIGH SURGERY      Social History   Socioeconomic History  . Marital status: Married    Spouse name: Not on file  . Number of children: 3  . Years of education: Not on file  . Highest education level: Not on file  Social Needs  . Financial resource strain: Not on file  . Food insecurity - worry: Not on file  . Food insecurity - inability: Not on file  . Transportation needs - medical: Not on file  . Transportation needs -  non-medical: Not on file  Occupational History  . Not on file  Tobacco Use  . Smoking status: Never Smoker  . Smokeless tobacco: Never Used  Substance and Sexual Activity  . Alcohol use: No    Alcohol/week: 0.0 oz  . Drug use: No  . Sexual activity: Yes    Partners: Male    Birth control/protection:  Post-menopausal  Other Topics Concern  . Not on file  Social History Narrative   Lives at home with husband.      Vitals:   07/22/17 1053  BP: 106/80  Pulse: 71  SpO2: 94%  Weight: 207 lb 6.4 oz (94.1 kg)  Height: 5' 2" (1.575 m)    Wt Readings from Last 3 Encounters:  07/22/17 207 lb 6.4 oz (94.1 kg)  07/17/17 212 lb (96.2 kg)  06/26/17 205 lb (93 kg)     PHYSICAL EXAM General: NAD HEENT: Normal. Neck: No JVD, no thyromegaly. Lungs: Clear to auscultation bilaterally with normal respiratory effort. CV: Regular rate and irregular rhythm, normal S1/S2, no S3, no murmur. No pretibial or periankle edema.  Abdomen: Soft, nontender, obese.  Neurologic: Alert and oriented.  Psych: Normal affect. Skin: Normal. Musculoskeletal: No gross deformities.    ECG: Most recent ECG reviewed.   Labs: Lab Results  Component Value Date/Time   K 4.1 05/28/2017 10:04 AM   BUN 24 (H) 05/28/2017 10:04 AM   BUN 26 05/27/2017 01:19 PM   CREATININE 1.16 (H) 05/28/2017 10:04 AM   ALT 16 05/28/2017 10:04 AM   TSH 1.922 05/19/2017 01:18 PM   TSH 3.550 01/04/2015 10:28 AM   HGB 14.7 05/28/2017 10:04 AM   HGB 14.2 05/27/2017 01:19 PM     Lipids: Lab Results  Component Value Date/Time   LDLCALC 71 01/04/2015 10:28 AM   LDLDIRECT 62 03/17/2016 04:14 PM   CHOL 163 01/04/2015 10:28 AM   TRIG 249 (H) 01/04/2015 10:28 AM   HDL 42 01/04/2015 10:28 AM       ASSESSMENT AND PLAN: 1. Permanent atrial fibrillation:  Symptomatically stable.  Continue long acting diltiazem 120 mg daily and metoprolol 125 mg twice daily.  Anticoagulated withapixaban.  2. Chronic diastolic heart failure:  Euvolemic and stable.    Weight is down 5 pounds in the last 5 days.  Currently takes Lasix 20 mg twice daily.  I instructed her to take the evening dose at 6 PM to attenuate frequency of sleep interruption.  3. Hypertension: Controlled. No changes.  4. Hyperlipidemia: Continue Lipitor 40 mg.      Disposition: Follow up as previously scheduled in 5 months   Kate Sable, M.D., F.A.C.C.

## 2017-07-22 NOTE — Progress Notes (Signed)
   HPI  Patient presents today for follow up and to discuss shortness of breath.  Patient states that last Thursday she had shortness of breath, she was seen by cardiology today and states that her symptoms have completely resolved.  Patient needs refill of Lyrica, she uses for fibromyalgia and neuropathy. She would like an order placed for B12 which was recommended by neurology.  She like a refill of Combivent Respimat she used for shortness of breath previously.  She also wants to discuss foot pain. Patient had a 4-hour episode of bilateral foot pain that was on the bilateral forefeet described as intense pain.  Not burning like typical neuropathy. She woke up with it, no injury. Resolved on its own  PMH: Smoking status noted ROS: Per HPI  Objective: BP 126/85   Pulse 81   Temp (!) 97.4 F (36.3 C) (Oral)   Ht 5\' 2"  (1.575 m)   Wt 210 lb 3.2 oz (95.3 kg)   BMI 38.45 kg/m  Gen: NAD, alert, cooperative with exam HEENT: NCAT, EOMI, PERRL CV: RRR, good S1/S2, no murmur Resp: CTABL, no wheezes, non-labored Ext: No edema, warm Neuro: Alert and oriented, No gross deficits  Assessment and plan:  #Shortness of breath Transient, patient did have improvement after restarting diuretic Combivent requested which was given  #Foot pain, bilateral Unusual onset, likely neuropathy Continue Lyrica, refilled   #Fibromyalgia Stable, refill Lyrica   #Memory changes See neurology note for full evaluation, B12 ordered    Meds ordered this encounter  Medications  . pregabalin (LYRICA) 150 MG capsule    Sig: Take 1 capsule (150 mg total) by mouth 2 (two) times daily.    Dispense:  180 capsule    Refill:  1  . Ipratropium-Albuterol (COMBIVENT RESPIMAT) 20-100 MCG/ACT AERS respimat    Sig: Inhale 1 puff into the lungs every 6 (six) hours.    Dispense:  4 g    Refill:  [place], MD [facility] 07/22/2017, 2:19 PM

## 2017-07-23 DIAGNOSIS — M19111 Post-traumatic osteoarthritis, right shoulder: Secondary | ICD-10-CM | POA: Diagnosis not present

## 2017-07-23 DIAGNOSIS — M25511 Pain in right shoulder: Secondary | ICD-10-CM | POA: Diagnosis not present

## 2017-07-27 ENCOUNTER — Other Ambulatory Visit: Payer: Self-pay | Admitting: Family Medicine

## 2017-07-30 DIAGNOSIS — M5136 Other intervertebral disc degeneration, lumbar region: Secondary | ICD-10-CM | POA: Diagnosis not present

## 2017-07-30 DIAGNOSIS — Z79899 Other long term (current) drug therapy: Secondary | ICD-10-CM | POA: Diagnosis not present

## 2017-07-30 DIAGNOSIS — M545 Low back pain: Secondary | ICD-10-CM | POA: Diagnosis not present

## 2017-08-04 DIAGNOSIS — E1165 Type 2 diabetes mellitus with hyperglycemia: Secondary | ICD-10-CM | POA: Diagnosis not present

## 2017-08-04 DIAGNOSIS — E1122 Type 2 diabetes mellitus with diabetic chronic kidney disease: Secondary | ICD-10-CM | POA: Diagnosis not present

## 2017-08-04 DIAGNOSIS — N183 Chronic kidney disease, stage 3 (moderate): Secondary | ICD-10-CM | POA: Diagnosis not present

## 2017-08-04 DIAGNOSIS — E1140 Type 2 diabetes mellitus with diabetic neuropathy, unspecified: Secondary | ICD-10-CM | POA: Diagnosis not present

## 2017-08-04 DIAGNOSIS — Z794 Long term (current) use of insulin: Secondary | ICD-10-CM | POA: Diagnosis not present

## 2017-08-05 ENCOUNTER — Telehealth (HOSPITAL_COMMUNITY): Payer: Self-pay

## 2017-08-05 NOTE — Telephone Encounter (Signed)
Patient is calling because her pain management clinic is wanting her to come off of the Lorazepam and she would like to know if you could tell her how best to taper off of the medication. Please review and advise, thank you

## 2017-08-06 ENCOUNTER — Other Ambulatory Visit: Payer: Self-pay | Admitting: Family Medicine

## 2017-08-06 MED ORDER — LIDOCAINE 5 % EX PTCH: 1.0000 | MEDICATED_PATCH | CUTANEOUS | 2 refills | Status: DC

## 2017-08-06 NOTE — Telephone Encounter (Signed)
Per Dr. Casimiro Needle I called patient and advised her to take 1 po qam and 1 po qhs for 1 week, then 1 po qhs for 1 week and then 0.5 qhs for one week and stop. Patient voiced her understanding and will call me if she has any questions

## 2017-08-06 NOTE — Telephone Encounter (Signed)
Lidoderm patch refill sent.   Laroy Apple, MD [facility] 08/06/2017, 2:55 PM

## 2017-08-06 NOTE — Telephone Encounter (Signed)
What is the name of the medication? Lidocaine patch 5%  Have you contacted your pharmacy to request a refill? NO  Which pharmacy would you like this sent to? [facility] in [place]   Patient notified that their request is being sent to the clinical staff for review and that they should receive a call once it is complete. If they do not receive a call within 24 hours they can check with their pharmacy or our office.

## 2017-08-10 ENCOUNTER — Encounter (HOSPITAL_COMMUNITY): Payer: Self-pay

## 2017-08-10 ENCOUNTER — Emergency Department (HOSPITAL_COMMUNITY)
Admission: EM | Admit: 2017-08-10 | Discharge: 2017-08-11 | Disposition: A | Discharge status: Home / Self Care | Payer: Medicare Other | Attending: Emergency Medicine | Admitting: Emergency Medicine

## 2017-08-10 ENCOUNTER — Other Ambulatory Visit: Payer: Self-pay

## 2017-08-10 ENCOUNTER — Emergency Department (HOSPITAL_COMMUNITY): Payer: Medicare Other

## 2017-08-10 DIAGNOSIS — I130 Hypertensive heart and chronic kidney disease with heart failure and stage 1 through stage 4 chronic kidney disease, or unspecified chronic kidney disease: Secondary | ICD-10-CM | POA: Insufficient documentation

## 2017-08-10 DIAGNOSIS — Z79899 Other long term (current) drug therapy: Secondary | ICD-10-CM | POA: Insufficient documentation

## 2017-08-10 DIAGNOSIS — R531 Weakness: Secondary | ICD-10-CM

## 2017-08-10 DIAGNOSIS — E119 Type 2 diabetes mellitus without complications: Secondary | ICD-10-CM | POA: Insufficient documentation

## 2017-08-10 DIAGNOSIS — R918 Other nonspecific abnormal finding of lung field: Secondary | ICD-10-CM | POA: Diagnosis not present

## 2017-08-10 DIAGNOSIS — N183 Chronic kidney disease, stage 3 (moderate): Secondary | ICD-10-CM | POA: Insufficient documentation

## 2017-08-10 DIAGNOSIS — I5032 Chronic diastolic (congestive) heart failure: Secondary | ICD-10-CM | POA: Diagnosis not present

## 2017-08-10 DIAGNOSIS — Z794 Long term (current) use of insulin: Secondary | ICD-10-CM | POA: Diagnosis not present

## 2017-08-10 DIAGNOSIS — M6281 Muscle weakness (generalized): Secondary | ICD-10-CM | POA: Diagnosis not present

## 2017-08-10 LAB — URINALYSIS, ROUTINE W REFLEX MICROSCOPIC
Bilirubin Urine: NEGATIVE
Glucose, UA: >=500 mg/dL — AB
Hgb urine dipstick: NEGATIVE
Ketones, ur: NEGATIVE mg/dL
Nitrite: NEGATIVE
Protein, ur: NEGATIVE mg/dL
Specific Gravity, Urine: 1.015 (ref 1.005–1.030)
pH: 5 (ref 5.0–8.0)

## 2017-08-10 LAB — CBG MONITORING, ED: Glucose-Capillary: 216 mg/dL — ABNORMAL HIGH (ref 65–99)

## 2017-08-10 LAB — CBC
HCT: 43.3 % (ref 36.0–46.0)
Hemoglobin: 14.4 g/dL (ref 12.0–15.0)
MCH: 30 pg (ref 26.0–34.0)
MCHC: 33.3 g/dL (ref 30.0–36.0)
MCV: 90.2 fL (ref 78.0–100.0)
Platelets: 232 10*3/uL (ref 150–400)
RBC: 4.8 MIL/uL (ref 3.87–5.11)
RDW: 13.4 % (ref 11.5–15.5)
WBC: 9.8 10*3/uL (ref 4.0–10.5)

## 2017-08-10 LAB — BASIC METABOLIC PANEL
Anion gap: 12 (ref 5–15)
BUN: 19 mg/dL (ref 6–20)
CO2: 22 mmol/L (ref 22–32)
Calcium: 8.8 mg/dL — ABNORMAL LOW (ref 8.9–10.3)
Chloride: 102 mmol/L (ref 101–111)
Creatinine, Ser: 1.1 mg/dL — ABNORMAL HIGH (ref 0.44–1.00)
GFR calc Af Amer: 58 mL/min — ABNORMAL LOW (ref >60)
GFR calc non Af Amer: 50 mL/min — ABNORMAL LOW (ref >60)
Glucose, Bld: 300 mg/dL — ABNORMAL HIGH (ref 65–99)
Potassium: 3.9 mmol/L (ref 3.5–5.1)
Sodium: 136 mmol/L (ref 135–145)

## 2017-08-10 LAB — AMMONIA: Ammonia: 23 umol/L (ref 9–35)

## 2017-08-10 LAB — CARBAMAZEPINE LEVEL, TOTAL: Carbamazepine Lvl: 11 ug/mL (ref 4.0–12.0)

## 2017-08-10 LAB — MAGNESIUM: Magnesium: 1.9 mg/dL (ref 1.7–2.4)

## 2017-08-10 MED ORDER — SODIUM CHLORIDE 0.9 % IV BOLUS (SEPSIS)
500.0000 mL | Freq: Once | INTRAVENOUS | Status: AC
  Administered 2017-08-10: 500 mL via INTRAVENOUS

## 2017-08-10 MED ORDER — DIPHENHYDRAMINE HCL 50 MG/ML IJ SOLN
25.0000 mg | Freq: Once | INTRAMUSCULAR | Status: AC
  Administered 2017-08-10: 25 mg via INTRAVENOUS
  Filled 2017-08-10: qty 1

## 2017-08-10 MED ORDER — IPRATROPIUM-ALBUTEROL 0.5-2.5 (3) MG/3ML IN SOLN
3.0000 mL | Freq: Once | RESPIRATORY_TRACT | Status: AC
  Administered 2017-08-10: 3 mL via RESPIRATORY_TRACT
  Filled 2017-08-10: qty 3

## 2017-08-10 NOTE — ED Notes (Signed)
ED Provider at bedside. 

## 2017-08-10 NOTE — ED Provider Notes (Signed)
Ammonia and carbamazepine levels are normal.  Atelectasis seen on CXR. VSS.  Neurovascularly intact.  Stable for outpatient follow-up per original plan set forth by Caryl Asp, PA-C.   Montine Circle, PA-C 08/10/17 [place], Kevin, MD 08/11/17 2112

## 2017-08-10 NOTE — Discharge Instructions (Signed)
Please follow-up with your neurologist.  No clear explanation of your symptoms was found in the emergency department.  You will need to continue the investigation of your symptoms with your doctor.  Please return for new or worsening symptoms.  Please take medications as directed.

## 2017-08-10 NOTE — ED Provider Notes (Signed)
[facility] EMERGENCY DEPARTMENT Provider Note   CSN: [id number] Arrival date & time: 08/10/17  1220     History   Chief Complaint Chief Complaint  Patient presents with  . Weakness    HPI Amber Stephenson is a 72 y.o. female.  HPI   Amber Stephenson is a 72 y.o. female, with a history of HTN, Afib, CHF, DM, and renal insufficiency, presenting to the ED with difficulty with coordination as well as tremoring beginning March 8. Patient states, "it feels as though I am having trouble controlling my movements."  This sensation waxes and wanes.  It improved this morning, but then recurred and was accompanied by tingling in the extremities. She states she has never experienced this before. Patient also notes shortness of breath this weekend.  She states intermittent shortness of breath and orthopnea is not new for her. Denies fever/chills, agitation, diaphoresis, chest pain, abdominal pain, falls/trauma, vision abnormalities, changes in bowel or bladder function, N/V/D, or any other complaints.   Past Medical History:  Diagnosis Date  . Atrial fibrillation ([place])   . Bipolar affective ([place])   . CHF (congestive heart failure) ([place])   . Chronic back pain   . Diabetes mellitus without complication ([place])   . Hyperlipidemia   . Hypertension   . Pain management   . Renal insufficiency     Patient Active Problem List   Diagnosis Date Noted  . Atrial fibrillation with RVR ([place]) 05/19/2017  . Microscopic colitis 05/19/2017  . Hypotension 05/19/2017  . Vulvovaginal candidiasis 05/19/2017  . Fibromyalgia 03/19/2017  . Healthcare maintenance 02/12/2016  . Migraine headache with aura 02/12/2016  . Diabetic neuropathy ([place]) 02/06/2016  . Depression   . Herpes genitalis in women 07/16/2015  . Chronic anticoagulation 05/30/2015  . Family history of coronary artery disease in sister 05/30/2015  . Chronic diastolic CHF (congestive heart failure) ([place]) 05/30/2015  .  Chest pain with moderate risk of acute coronary syndrome 05/29/2015  . Essential hypertension   . RLS (restless legs syndrome) 04/27/2015  . Lipoma 02/08/2015  . Bipolar disorder ([place]) 01/23/2015  . Insomnia 01/23/2015  . CKD stage 3 due to type 2 diabetes mellitus ([place]) 01/05/2015  . Chronic back pain 01/04/2015  . Permanent atrial fibrillation ([place]) 01/04/2015  . Diabetes mellitus with stage 2 chronic kidney disease ([place])   . Hyperlipidemia     Past Surgical History:  Procedure Laterality Date  . BREAST REDUCTION SURGERY    . EYE SURGERY Right    cateracts  . HAMMER TOE SURGERY    . SHOULDER SURGERY Right   . THIGH SURGERY      OB History    No data available       Home Medications    Prior to Admission medications   Medication Sig Start Date End Date Taking? Authorizing Provider  apixaban (ELIQUIS) 5 MG TABS tablet Take 5 mg by mouth 2 (two) times daily.    [provider]  ARIPiprazole (ABILIFY) 10 MG tablet TAKE 3 TABLETS EVERY MORNING 07/15/17   Plovsky, Berneta Sages, MD  atorvastatin (LIPITOR) 40 MG tablet TAKE 1 TABLET DAILY 07/27/17   Timmothy Euler, MD  Blood Glucose Monitoring Suppl (FREESTYLE LITE) DEVI Use to check blood sugar tid. DX E11.22 04/04/15   Timmothy Euler, MD  budesonide (ENTOCORT EC) 3 MG 24 hr capsule Take 9 mg by mouth every morning.  04/01/17   [provider]  carbamazepine (TEGRETOL) 200 MG tablet Take 1 tablet (200  mg total) by mouth 3 (three) times daily. 07/15/17 07/15/18  Plovsky, Berneta Sages, MD  Cholecalciferol (VITAMIN D3) 1000 UNITS CAPS Take 1,000 Units by mouth daily.    [provider]  clotrimazole (LOTRIMIN) 1 % cream APPLY EXTERNALLY TO THE AFFECTED AREA TWICE DAILY 06/18/17   Timmothy Euler, MD  diltiazem (CARDIZEM CD) 120 MG 24 hr capsule Take 1 capsule (120 mg total) by mouth daily. 06/26/17   Herminio Commons, MD  Dulaglutide (TRULICITY) 6.73 AL/9.3XT SOPN Inject 0.75 mg into the skin once a week.  06/22/17   Timmothy Euler, MD  FREESTYLE LITE test strip USE FOR TESTING SUGAR THREE TIMES DAILY 01/19/17   Timmothy Euler, MD  furosemide (LASIX) 20 MG tablet TAKE 1 TABLET TWICE A DAY 05/29/17   Timmothy Euler, MD  Insulin Pen Needle (SURE COMFORT PEN NEEDLES) 32G X 4 MM MISC Use with insulin pens as directed. Dx E11.9 08/16/15   Timmothy Euler, MD  Ipratropium-Albuterol (COMBIVENT RESPIMAT) 20-100 MCG/ACT AERS respimat Inhale 1 puff into the lungs every 6 (six) hours. 07/22/17   Timmothy Euler, MD  ipratropium-albuterol (DUONEB) 0.5-2.5 (3) MG/3ML SOLN Take 3 mLs by nebulization every 4 (four) hours as needed.    [provider]  Lancets (FREESTYLE) lancets Test tid. DX E11.22 04/04/15   Timmothy Euler, MD  LANTUS SOLOSTAR 100 UNIT/ML Solostar Pen INJECT 38 UNITS UNDER THE SKIN TWICE A DAY Patient taking differently: 50 units into the skin two times a day- morning and night 06/12/16   Elayne Snare, MD  levalbuterol Penne Lash) 1.25 MG/3ML nebulizer solution Take 1.25 mg by nebulization every 4 (four) hours as needed for wheezing. 07/18/16   Timmothy Euler, MD  lidocaine (LIDODERM) 5 % Place 1 patch onto the skin daily. Remove & Discard patch within 12 hours or as directed by MD 08/06/17   Timmothy Euler, MD  lidocaine (XYLOCAINE) 2 % solution USE 20 MLS IN THE MOUTH OR THROAT AS NEEDED FOR MOUTH PAIN AS DIRECTED 06/17/17   Terald Sleeper, PA-C  LORazepam (ATIVAN) 1 MG tablet Take 1-2 tablets (1-2 mg total) by mouth See admin instructions. 1 mg in the morning and 2 mg at bedtime 07/15/17   Plovsky, Berneta Sages, MD  metoprolol tartrate (LOPRESSOR) 100 MG tablet TAKE 1 TABLET TWICE A DAY Patient taking differently: Take 100 mg by mouth two times a day 05/01/17   Herminio Commons, MD  metoprolol tartrate (LOPRESSOR) 25 MG tablet TAKE 1 TABLET TWICE A DAY (TO BE TAKEN ALONG WITH THE LOPRESSOR 100 MG TWICE A DAY) Patient taking differently: Take 25 mg by mouth two times a day  11/18/16   Herminio Commons, MD  [facility] 4 MG/0.1ML LIQD nasal spray kit Place 1 spray into the nose once as needed (AS DIRECTED).  05/01/17   [provider]  oxyCODONE-acetaminophen (PERCOCET) 7.5-325 MG tablet Take 1 tablet by mouth every 6 (six) hours. 05/08/17   [provider]  pregabalin (LYRICA) 150 MG capsule Take 1 capsule (150 mg total) by mouth 2 (two) times daily. 07/22/17   Timmothy Euler, MD  rizatriptan (MAXALT-MLT) 10 MG disintegrating tablet DISSOLVE 1 TABLET UNDER THE TONGUE IMMEDIATELY, MAY REPEAT DOSE IN 1 HOUR IF NEEDED. MAX 3 TABLETS IN 24 HOURS 05/01/17   Timmothy Euler, MD  SUMAtriptan (IMITREX) 100 MG tablet TAKE 1 TABLET BY MOUTH AT ONSET OF HEADACHE. MAY REPEAT IN 2 HOURS IF HEADACHE PERSISTS OR RECURS.LIMIT 2 PER 24  HOURS 02/06/17   Timmothy Euler, MD  temazepam (RESTORIL) 15 MG capsule 2  qhs 04/08/17   Plovsky, Berneta Sages, MD  tizanidine (ZANAFLEX) 6 MG capsule TAKE 1 CAPSULE THREE TIMES A DAY AS NEEDED FOR MUSCLE SPASMS Patient taking differently: Take 6 mg by mouth 3 (three) times daily as needed for muscle spasms.  03/19/17   Timmothy Euler, MD  traZODone (DESYREL) 100 MG tablet 1  qhs and if not effective after 2 nights  Increase to 2  qhs  and  After 2  Days not effective 3  qhs 07/15/17   Plovsky, Berneta Sages, MD  TRICOR 145 MG tablet TAKE 1 TABLET DAILY Patient taking differently: Take 145 mg by mouth once daily 05/01/17   Terald Sleeper, PA-C    Family History Family History  Problem Relation Age of Onset  . Diabetes Mother   . Heart disease Mother   . Heart disease Brother   . Heart disease Sister        CABG  . Mental illness Brother   . Diabetes Brother     Social History Social History   Tobacco Use  . Smoking status: Never Smoker  . Smokeless tobacco: Never Used  Substance Use Topics  . Alcohol use: No    Alcohol/week: 0.0 oz  . Drug use: No     Allergies   Ivp dye [iodinated diagnostic agents]   Review of  Systems Review of Systems  Constitutional: Negative for chills, diaphoresis and fever.  Eyes: Negative for visual disturbance.  Respiratory: Negative for cough and shortness of breath.   Cardiovascular: Negative for chest pain.  Gastrointestinal: Negative for abdominal pain, diarrhea, nausea and vomiting.  Musculoskeletal: Negative for back pain and neck pain.  Neurological: Negative for seizures, syncope, speech difficulty and numbness.       Difficulty with coordination  All other systems reviewed and are negative.    Physical Exam Updated Vital Signs BP 105/82   Pulse 60   Temp 98 F (36.7 C) (Oral)   Resp 18   Wt 95.3 kg (210 lb)   SpO2 98%   BMI 38.41 kg/m   Physical Exam  Constitutional: She appears well-developed and well-nourished. No distress.  HENT:  Head: Normocephalic and atraumatic.  Mouth/Throat: Oropharynx is clear and moist.  Eyes: Conjunctivae and EOM are normal. Pupils are equal, round, and reactive to light.  Neck: Normal range of motion. Neck supple.  Cardiovascular: Normal rate, regular rhythm, normal heart sounds and intact distal pulses.  Pulmonary/Chest: Effort normal and breath sounds normal. No respiratory distress.  Abdominal: Soft. There is no tenderness. There is no guarding.  Musculoskeletal: She exhibits no edema.  Lymphadenopathy:    She has no cervical adenopathy.  Neurological: She is alert.  Reflex Scores:      Patellar reflexes are 3+ on the right side and 3+ on the left side. No sensory deficits.  No noted speech deficits. No aphasia. Intention tremor noted in the bilateral upper extremities. Patient handles oral secretions without difficulty. No noted swallowing defects.  Equal grip strength bilaterally. Strength 5/5 in the upper extremities. Strength 5/5 with flexion and extension of the hips, knees, and ankles bilaterally.  Patellar reflexes are increased, but without clonus. Gait is slow, shuffling, and shaky Coordination  intact including heel to shin and finger to nose.  Cranial nerves III-XII grossly intact.  No facial droop.   Skin: Skin is warm and dry. She is not diaphoretic.  Psychiatric: She has a  normal mood and affect. Her behavior is normal.  Nursing note and vitals reviewed.    ED Treatments / Results  Labs (all labs ordered are listed, but only abnormal results are displayed) Labs Reviewed  BASIC METABOLIC PANEL - Abnormal; Notable for the following components:      Result Value   Glucose, Bld 300 (*)    Creatinine, Ser 1.10 (*)    Calcium 8.8 (*)    GFR calc non Af Amer 50 (*)    GFR calc Af Amer 58 (*)    All other components within normal limits  URINALYSIS, ROUTINE W REFLEX MICROSCOPIC - Abnormal; Notable for the following components:   APPearance HAZY (*)    Glucose, UA >=500 (*)    Leukocytes, UA LARGE (*)    Bacteria, UA FEW (*)    Squamous Epithelial / LPF 0-5 (*)    Non Squamous Epithelial 0-5 (*)    All other components within normal limits  CBG MONITORING, ED - Abnormal; Notable for the following components:   Glucose-Capillary 216 (*)    All other components within normal limits  CBC  CARBAMAZEPINE LEVEL, TOTAL    EKG  EKG Interpretation None       Radiology No results found.  Procedures Procedures (including critical care time)  Medications Ordered in ED Medications - No data to display   Initial Impression / Assessment and Plan / ED Course  I have reviewed the triage vital signs and the nursing notes.  Pertinent labs & imaging results that were available during my care of the patient were reviewed by me and considered in my medical decision making (see chart for details).  Clinical Course as of Aug 10 2313  Mon Aug 10, 2017  2150 Spoke with Dr. Lorraine Lax, neurologist.  Recommends ammonia level and agrees with tegretol level.  Does not recommend any further imaging.  If no significant abnormalities are found on lab work or head CT, patient may be  discharged to follow-up with her PCP/psychiatrist and neurologist.  [SJ]    Clinical Course User Index [SJ] Tavin Vernet C, PA-C   Patient presents with feelings of bilateral extremity weakness, difficulty with coordination, and tremors for the last 4 days.  No noted objective weakness or sensory deficits. Lower suspicion for serotonin syndrome or extrapyramidal reaction.  However, hypertherapeutic Tegretol level is a consideration. CT without acute abnormality.   End of shift patient care handoff report given to [employer], PA-C. Plan: Labs and CXR pending.  Periodically reevaluate patient, including breathing status.  In the absence of lab abnormalities, reevaluate patient's neurologic status.  If patient is functional and safe, she may follow-up with her PCP/psychiatrist and neurologist.  Vitals:   08/10/17 1503 08/10/17 1616 08/10/17 1744 08/10/17 1948  BP: 100/63 112/86 140/75 105/82  Pulse: 77 62 64 60  Resp: _0 Temp: (!) 97.5 F (36.4 C) 98 F (36.7 C)    TempSrc: Oral Oral    SpO2: 95% 98% 95% 98%  Weight:         Final Clinical Impressions(s) / ED Diagnoses   Final diagnoses:  None    ED Discharge Orders    None       Layla Maw 08/10/17 2315    Jola Schmidt, MD 08/11/17 2119

## 2017-08-10 NOTE — ED Notes (Signed)
Checked CBG per pt husband request. States pt is diabetic and has not eaten since 0700 today.

## 2017-08-10 NOTE — ED Notes (Signed)
Patient returned from CT

## 2017-08-10 NOTE — ED Triage Notes (Signed)
Pt presents to the ed with complaints of sudden onset Friday of weakness in both legs and un coordination. Pt complains of now having tingling in her arms and legs and still feel weak all over.

## 2017-08-10 NOTE — ED Notes (Signed)
Patient transported to CT 

## 2017-08-12 ENCOUNTER — Other Ambulatory Visit: Payer: Self-pay | Admitting: Family Medicine

## 2017-08-12 MED ORDER — FREESTYLE LANCETS MISC: 5 refills | Status: DC

## 2017-08-12 NOTE — Telephone Encounter (Signed)
Prescription sent in, patient aware. 

## 2017-08-20 ENCOUNTER — Ambulatory Visit (INDEPENDENT_AMBULATORY_CARE_PROVIDER_SITE_OTHER): Payer: Medicare Other

## 2017-08-20 ENCOUNTER — Ambulatory Visit (INDEPENDENT_AMBULATORY_CARE_PROVIDER_SITE_OTHER): Payer: Medicare Other | Admitting: Family Medicine

## 2017-08-20 ENCOUNTER — Ambulatory Visit: Payer: Medicare Other | Admitting: Family Medicine

## 2017-08-20 VITALS — BP 141/81 | HR 74 | Temp 97.1°F | Ht 62.0 in | Wt 210.2 lb

## 2017-08-20 DIAGNOSIS — S299XXA Unspecified injury of thorax, initial encounter: Secondary | ICD-10-CM | POA: Diagnosis not present

## 2017-08-20 DIAGNOSIS — N182 Chronic kidney disease, stage 2 (mild): Secondary | ICD-10-CM

## 2017-08-20 DIAGNOSIS — E1122 Type 2 diabetes mellitus with diabetic chronic kidney disease: Secondary | ICD-10-CM | POA: Diagnosis not present

## 2017-08-20 DIAGNOSIS — R0781 Pleurodynia: Secondary | ICD-10-CM

## 2017-08-20 DIAGNOSIS — G8929 Other chronic pain: Secondary | ICD-10-CM | POA: Diagnosis not present

## 2017-08-20 MED ORDER — DULAGLUTIDE 1.5 MG/0.5ML ~~LOC~~ SOAJ: 1.5000 mg | SUBCUTANEOUS | 3 refills | Status: DC

## 2017-08-20 NOTE — Progress Notes (Signed)
   HPI  Patient presents today here for hospital follow-up.  Patient was seen in the hospital for generalized weakness, work-up was negative and she was discharged.  She states that she is improving slightly, she had a fall on 3/17 landing on her left anterior ribs on the arm of the chair.  She states that she hurts whenever she twists a certain way and has some sharp rib pain.  She is concerned about fracture.  Type 2 diabetes Patient feels that she received very good care from her new endocrinologist, she would like to titrate her Trulicity  Chronic back pain Patient has been on chronic narcotics long-term, she is established with a pain management clinic and doing pretty well.  She states that they have been steadily decreasing her doses and that it is quite an adjustment.  She is surprisingly not complaining but stating that it is a occult adjustment.  She is concerned that they are recommending stopping her Ativan, she is managed for bipolar disorder by her psychiatrist who reportedly feels that it is medically necessary. We discussed the dangers around chronic opiates  PMH: Smoking status noted ROS: Per HPI  Objective: BP (!) 141/81   Pulse 74   Temp (!) 97.1 F (36.2 C) (Oral)   Ht 5\' 2"  (1.575 m)   Wt 210 lb 3.2 oz (95.3 kg)   BMI 38.45 kg/m  Gen: NAD, alert, cooperative with exam HEENT: NCAT CV: RRR, good S1/S2, no murmur Resp: CTABL, no wheezes, non-labored Ext: No edema, warm Neuro: Alert and oriented, No gross deficits  Assessment and plan:  #Rib pain on the left side After a fall Plain film pending, unlikely to be fracture based on my exam Likely strained intercostal muscles Discussed supportive care  #Chronic pain Patient with significant chronic pain, she is managed primarily by pain management. We discussed reasons why they were likely titrating her doses down and openly communicating with her psychiatrist and pain management doctors.  #Type 2  diabetes Patient has established with endocrinologist, uncontrolled Titrate Trulicity to 1.5 mg weekly, recommended that the patient follow-up with endocrinology around April 21 for her next A1c    Orders Placed This Encounter  Procedures  . DG Ribs Unilateral W/Chest Left    Standing Status:   Future    Number of Occurrences:   1    Standing Expiration Date:   10/20/2018    Order Specific Question:   Reason for Exam (SYMPTOM  OR DIAGNOSIS REQUIRED)    Answer:   L rib pain after fall. approx 7-9 th ribs    Order Specific Question:   Preferred imaging location?    Answer:   Internal    Meds ordered this encounter  Medications  . Dulaglutide (TRULICITY) 1.5 KZ/9.9JT SOPN    Sig: Inject 1.5 mg into the skin once a week.    Dispense:  12 pen    Refill:  [place], MD [facility] 08/20/2017, 4:45 PM

## 2017-08-21 ENCOUNTER — Telehealth: Payer: Self-pay | Admitting: Family Medicine

## 2017-08-21 NOTE — Telephone Encounter (Signed)
Please review and advise.

## 2017-08-21 NOTE — Telephone Encounter (Signed)
Patient aware.

## 2017-08-21 NOTE — Telephone Encounter (Signed)
Suspicion of minimally displaced fractures at the anterior left 7th and 9th costochondral junctions. Chronic anterior left 10th rib fracture appears stable since 2017.  Let Dr Wendi Snipes follow with her. Just have limited activity, no lifting, follow up Dr. Wendi Snipes

## 2017-08-24 NOTE — Telephone Encounter (Signed)
See result note. No changes to Tx.   Laroy Apple, MD [facility] 08/24/2017, 8:38 AM

## 2017-08-25 DIAGNOSIS — M79672 Pain in left foot: Secondary | ICD-10-CM | POA: Diagnosis not present

## 2017-08-25 DIAGNOSIS — M79675 Pain in left toe(s): Secondary | ICD-10-CM | POA: Diagnosis not present

## 2017-08-25 DIAGNOSIS — M7732 Calcaneal spur, left foot: Secondary | ICD-10-CM | POA: Diagnosis not present

## 2017-08-26 ENCOUNTER — Ambulatory Visit (INDEPENDENT_AMBULATORY_CARE_PROVIDER_SITE_OTHER): Payer: Medicare Other | Admitting: Psychiatry

## 2017-08-26 DIAGNOSIS — F3162 Bipolar disorder, current episode mixed, moderate: Secondary | ICD-10-CM

## 2017-08-26 DIAGNOSIS — Z818 Family history of other mental and behavioral disorders: Secondary | ICD-10-CM | POA: Diagnosis not present

## 2017-08-26 DIAGNOSIS — Z79899 Other long term (current) drug therapy: Secondary | ICD-10-CM | POA: Diagnosis not present

## 2017-08-26 MED ORDER — GABAPENTIN 100 MG PO CAPS: ORAL_CAPSULE | ORAL | 2 refills | Status: DC

## 2017-08-26 MED ORDER — CARBAMAZEPINE 200 MG PO TABS: ORAL_TABLET | ORAL | 5 refills | Status: DC

## 2017-08-26 MED ORDER — LORAZEPAM 1 MG PO TABS: 1.0000 mg | ORAL_TABLET | Freq: Two times a day (BID) | ORAL | 2 refills | Status: DC

## 2017-08-26 NOTE — Progress Notes (Signed)
Patient ID: Amber Stephenson, female   DOB: 08/09/1945, 72 y.o.   MRN: [id number] [facility] MD Progress Note  08/26/2017 2:14 PM [name]  MRN:  [id number] Subjective:  Doing well Principal Problem: Bipolar disorder most recent episode manic Diagnosis:  Bipolar disorder most recent  Today the patient is seen with her husband. Today the patient is actually doing fairly well. Her mood is stable. She is not oversedated. Unfortunately there was some confusion about the proper dose of Tegretol. She was 100 mg twice a day. I suspect that her to increase it we changed 200 for reasons that are not clear he was changed to 3 times a day. Apparently days later she felt very comfortable went to the emergency room appropriate.What is curious is that she had a Tegretol level in the emergency room that was therapeutic not toxic. Nonetheless clinically she showed to have a dose of Tegretol in that she could not coordinate herself had problems walking. She probably reduce it down to 200 mg twice a day now she is walking well. She denies any side effects from the Tegretol. Unfortunately one point she even fell and broke some ribs. For some reason she overreacted to the Tegretol. At this time it makes good sense for her to get a Tegretol blood level present dose. At this time the patient denies being depressed or she is not hyperactive past. She said no signs of mania. She has no psychosis. At this time the major issues related to her pain clinic who insisted that she come off of Ativan in order to receive her prescriptions for Percocet. At this time she is sleeping mildly better. She still doesn't sleep well still has sleepiness during the day. Patient is not suicidal. She is not homicidal. Past Medical History:  Past Medical History:  Diagnosis Date  . Atrial fibrillation ([place])   . Bipolar affective ([place])   . CHF (congestive heart failure) ([place])   . Chronic back pain   . Diabetes mellitus without complication ([place])   .  Hyperlipidemia   . Hypertension   . Pain management   . Renal insufficiency     Past Surgical History:  Procedure Laterality Date  . BREAST REDUCTION SURGERY    . EYE SURGERY Right    cateracts  . HAMMER TOE SURGERY    . SHOULDER SURGERY Right   . THIGH SURGERY     Family History:  Family History  Problem Relation Age of Onset  . Diabetes Mother   . Heart disease Mother   . Heart disease Brother   . Heart disease Sister        CABG  . Mental illness Brother   . Diabetes Brother    Family Psychiatric  History:  Social History:  Social History   Substance and Sexual Activity  Alcohol Use No  . Alcohol/week: 0.0 oz     Social History   Substance and Sexual Activity  Drug Use No    Social History   Socioeconomic History  . Marital status: Married    Spouse name: Not on file  . Number of children: 3  . Years of education: Not on file  . Highest education level: Not on file  Occupational History  . Not on file  Social Needs  . Financial resource strain: Not on file  . Food insecurity:    Worry: Not on file    Inability: Not on file  . Transportation needs:    Medical: Not on file  Non-medical: Not on file  Tobacco Use  . Smoking status: Never Smoker  . Smokeless tobacco: Never Used  Substance and Sexual Activity  . Alcohol use: No    Alcohol/week: 0.0 oz  . Drug use: No  . Sexual activity: Yes    Partners: Male    Birth control/protection: Post-menopausal  Lifestyle  . Physical activity:    Days per week: Not on file    Minutes per session: Not on file  . Stress: Not on file  Relationships  . Social connections:    Talks on phone: Not on file    Gets together: Not on file    Attends religious service: Not on file    Active member of club or organization: Not on file    Attends meetings of clubs or organizations: Not on file    Relationship status: Not on file  Other Topics Concern  . Not on file  Social History Narrative   Lives at home  with husband.    Additional Social History:                         Sleep: Good  Appetite:  Good  Current Medications: Current Outpatient Medications  Medication Sig Dispense Refill  . apixaban (ELIQUIS) 5 MG TABS tablet Take 5 mg by mouth 2 (two) times daily.    . ARIPiprazole (ABILIFY) 10 MG tablet TAKE 3 TABLETS EVERY MORNING (Patient taking differently: Take 30 mg by mouth daily. ) 270 tablet 2  . atorvastatin (LIPITOR) 40 MG tablet TAKE 1 TABLET DAILY (Patient taking differently: TAKE 1 TABLET (40 MG) BY MOUTH DAILY) 90 tablet 1  . Blood Glucose Monitoring Suppl (FREESTYLE LITE) DEVI Use to check blood sugar tid. DX E11.22 1 each 0  . budesonide (ENTOCORT EC) 3 MG 24 hr capsule Take 3 mg by mouth every other day.     . carbamazepine (TEGRETOL) 200 MG tablet 1   bid 60 tablet 5  . Cholecalciferol (VITAMIN D3) 1000 UNITS CAPS Take 1,000 Units by mouth daily.    . clotrimazole (LOTRIMIN) 1 % cream APPLY EXTERNALLY TO THE AFFECTED AREA TWICE DAILY 30 g 2  . diltiazem (CARDIZEM CD) 120 MG 24 hr capsule Take 1 capsule (120 mg total) by mouth daily. (Patient taking differently: Take 120 mg by mouth at bedtime. ) 90 capsule 3  . Dulaglutide (TRULICITY) 1.5 ZD/6.6YQ SOPN Inject 1.5 mg into the skin once a week. 12 pen 3  . FREESTYLE LITE test strip USE FOR TESTING SUGAR THREE TIMES DAILY 100 each 3  . furosemide (LASIX) 20 MG tablet TAKE 1 TABLET TWICE A DAY (Patient taking differently: TAKE 1 TABLET (20 MG)  TWICE A DAY- MORNING AND 6PM) 180 tablet 1  . ibuprofen (ADVIL,MOTRIN) 200 MG tablet Take 200 mg by mouth every 6 (six) hours as needed (pain).    . Insulin Pen Needle (SURE COMFORT PEN NEEDLES) 32G X 4 MM MISC Use with insulin pens as directed. Dx E11.9 300 each 11  . Ipratropium-Albuterol (COMBIVENT RESPIMAT) 20-100 MCG/ACT AERS respimat Inhale 1 puff into the lungs every 6 (six) hours. (Patient taking differently: Inhale 1 puff into the lungs every 6 (six) hours as needed for  wheezing or shortness of breath. ) 4 g 2  . ipratropium-albuterol (DUONEB) 0.5-2.5 (3) MG/3ML SOLN Take 3 mLs by nebulization every 4 (four) hours as needed (shortness of breath/ wheezing).     . Lancets (FREESTYLE) lancets Test tid. DX  E11.22 100 each 5  . LANTUS SOLOSTAR 100 UNIT/ML Solostar Pen INJECT 38 UNITS UNDER THE SKIN TWICE A DAY (Patient taking differently: INJECT 50 UNITS UNDER THE SKIN TWICE A DAY) 90 mL 2  . levalbuterol (XOPENEX) 1.25 MG/3ML nebulizer solution Take 1.25 mg by nebulization every 4 (four) hours as needed for wheezing. 72 mL 0  . lidocaine (LIDODERM) 5 % Place 1 patch onto the skin daily. Remove & Discard patch within 12 hours or as directed by MD (Patient taking differently: Place 1 patch onto the skin daily as needed (pain). Remove & Discard patch within 12 hours or as directed by MD) 30 patch 2  . lidocaine (XYLOCAINE) 2 % solution USE 20 MLS IN THE MOUTH OR THROAT AS NEEDED FOR MOUTH PAIN AS DIRECTED (Patient taking differently: SWISH AND SPIT ENOUGH TO FILL MOUTH DAILY AS NEEDED FOR MOUTH PAIN) 100 mL 0  . LORazepam (ATIVAN) 1 MG tablet Take 1 tablet (1 mg total) by mouth 2 (two) times daily. 90 tablet 2  . metoprolol tartrate (LOPRESSOR) 100 MG tablet TAKE 1 TABLET TWICE A DAY (Patient taking differently: TAKE 1 TABLET (100 MG) BY MOUTH TWICE DAILY WITH  A 25 MG TABLET FOR A TOTAL DOSE OF 125 MG) 180 tablet 3  . metoprolol tartrate (LOPRESSOR) 25 MG tablet TAKE 1 TABLET TWICE A DAY (TO BE TAKEN ALONG WITH THE LOPRESSOR 100 MG TWICE A DAY) (Patient taking differently: TAKE 1 TABLET (25 MG) BY MOUTH TWICE DAILY WITH A 100 MG TABLET FOR A TOTAL DOSE OF 125 MG) 180 tablet 3  . naloxone (NARCAN) nasal spray 4 mg/0.1 mL Place 1 spray into the nose once as needed (opiod overdose).    Marland Kitchen oxyCODONE-acetaminophen (PERCOCET/ROXICET) 5-325 MG tablet Take 1 tablet by mouth every 8 (eight) hours.    . pregabalin (LYRICA) 150 MG capsule Take 1 capsule (150 mg total) by mouth 2 (two)  times daily. 180 capsule 1  . rizatriptan (MAXALT-MLT) 10 MG disintegrating tablet DISSOLVE 1 TABLET UNDER THE TONGUE IMMEDIATELY, MAY REPEAT DOSE IN 1 HOUR IF NEEDED. MAX 3 TABLETS IN 24 HOURS (Patient taking differently: Take 10 mg by mouth See admin instructions. Dissolve 1 tablet (10 mg) under the tongue immediately, may repeat dose in 1 hour if needed (max 3 tablets in 24 hours)) 10 tablet 1  . SUMAtriptan (IMITREX) 100 MG tablet TAKE 1 TABLET BY MOUTH AT ONSET OF HEADACHE. MAY REPEAT IN 2 HOURS IF HEADACHE PERSISTS OR RECURS.LIMIT 2 PER 24 HOURS (Patient taking differently: TAKE 1 TABLET (100 MG) BY MOUTH AT ONSET OF HEADACHE. MAY REPEAT IN 2 HOURS IF HEADACHE PERSISTS OR RECURS.LIMIT 2 PER 24 HOURS) 10 tablet 5  . tizanidine (ZANAFLEX) 6 MG capsule TAKE 1 CAPSULE THREE TIMES A DAY AS NEEDED FOR MUSCLE SPASMS (Patient taking differently: Take 6 mg by mouth 3 (three) times daily as needed for muscle spasms. ) 90 capsule 3  . traZODone (DESYREL) 100 MG tablet 1  qhs and if not effective after 2 nights  Increase to 2  qhs  and  After 2  Days not effective 3  qhs (Patient taking differently: Take 300 mg by mouth at bedtime. ) 90 tablet 2  . TRICOR 145 MG tablet TAKE 1 TABLET DAILY (Patient taking differently: TAKE 1 TABLET (145 MG) BY MOUTH DAILY) 90 tablet 0  . gabapentin (NEURONTIN) 100 MG capsule 1  bid 60 capsule 2   No current facility-administered medications for this visit.  Lab Results: No results found for this or any previous visit (from the past 48 hour(s)).  Blood Alcohol level:  Lab Results  Component Value Date   ETH <5 10/01/2015    Physical Findings: AIMS:  , ,  ,  ,    CIWA:    COWS:     Musculoskeletal: Strength & Muscle Tone: within normal limits Gait & Station: normal Patient leans: N/A  Psychiatric Specialty Exam: ROS  There were no vitals taken for this visit.There is no height or weight on file to calculate BMI.  General Appearance: Casual  Eye Contact::   Good  Speech:  Clear and Coherent  Volume:  Normal  Mood:  Euthymic  Affect:  Appropriate  Thought Process:  Coherent  Orientation:  Full (Time, Place, and Person)  Thought Content:  WDL  Suicidal Thoughts:  No  Homicidal Thoughts:  No  Memory:  NA  Judgement:  Good  Insight:  Good  Psychomotor Activity:  Normal  Concentration:  Good  Recall:  Good  Fund of Knowledge:Good  Language: Good  Akathisia:  No  Handed:  Right  AIMS (if indicated):     Assets: Good spirits   ADL's:  Intact  Cognition: WNL  Sleep:     eks Treatment Plan Summary:  At this time we will clarify her medications. Psychiatrically she is actually very stable. Her only residual issue is problems sleeping. She often wakes up through the night and taken back to sleep. She takes 300 mg of trazodone which is clearly helped her sleep. She takes Ativan 1 mg one in the morning and 2 at night she's been on for a long time. Today we'll clarify that she's to take Tegretol 200 mg twice a day in the next few days will get a blood level. She'll continue taking Abilify 30 mg. At this time and effort to help her off her Ativan will go ahead and begin low-dose Neurontin 100 mg twice a day have her return to see [place] in 2 weeks. Also 0 telephone number to a different pain clinic doctor Nicholaus Bloom last suspect is more flexible. Nonetheless at this time we'll assume that she'll eventually asked to come off Ativan and will make efforts in that direction. She'll continue taking the Ativan one in the morning and 2 at night and return to see me in 2 weeks. If she has no problems with the Neurontin will go ahead and increase the Neurontin and begin tapering off Ativan slowly. When we attempted to reduce the Ativan to 1 twice a day she felt very very uncomfortable. My plan is with combination of a therapeutic Tegretol, continue all Abilify 30 mg and trazodone 300 mg that she can ultimately come off Ativan and have it replaced with Neurontin  should help her sleep for anxiety and ideally help her pain level. The patient return to see me in 2 weeks. At that time we'll increase her Neurontin is problems withand reduce her Ativan to 1 mg one in the morning one and a half at night. Also consider the possibility [place] in the future

## 2017-08-27 DIAGNOSIS — Z79899 Other long term (current) drug therapy: Secondary | ICD-10-CM | POA: Diagnosis not present

## 2017-08-27 DIAGNOSIS — M5136 Other intervertebral disc degeneration, lumbar region: Secondary | ICD-10-CM | POA: Diagnosis not present

## 2017-09-01 ENCOUNTER — Telehealth: Payer: Self-pay | Admitting: Family Medicine

## 2017-09-01 MED ORDER — FLUCONAZOLE 150 MG PO TABS: 150.0000 mg | ORAL_TABLET | Freq: Once | ORAL | 0 refills | Status: AC

## 2017-09-01 NOTE — Telephone Encounter (Signed)
Diflucan sent for presumed Yeast vaginitis.   Laroy Apple, MD [facility] 09/01/2017, 2:42 PM

## 2017-09-01 NOTE — Telephone Encounter (Signed)
What is the name of the medication? Diflucan  Have you contacted your pharmacy to request a refill? NO  Which pharmacy would you like this sent to? [facility]   Patient notified that their request is being sent to the clinical staff for review and that they should receive a call once it is complete. If they do not receive a call within 24 hours they can check with their pharmacy or our office.

## 2017-09-01 NOTE — Telephone Encounter (Signed)
Patient states that she has been having Vaginal itching and discharge x 3 days. Patient would like Diflucan sent to [facility] in [place]. Please advise

## 2017-09-01 NOTE — Telephone Encounter (Signed)
Patient aware.

## 2017-09-03 DIAGNOSIS — Z79899 Other long term (current) drug therapy: Secondary | ICD-10-CM | POA: Diagnosis not present

## 2017-09-04 ENCOUNTER — Other Ambulatory Visit: Payer: Self-pay | Admitting: Family Medicine

## 2017-09-04 LAB — CARBAMAZEPINE LEVEL, TOTAL: Carbamazepine (Tegretol), S: 8 ug/mL (ref 4.0–12.0)

## 2017-09-04 NOTE — Telephone Encounter (Signed)
Pt to call [facility] refill sent on 07/22/17 for 3 month supply with additional refill

## 2017-09-04 NOTE — Telephone Encounter (Signed)
What is the name of the medication? Lyrica 150 mg   Have you contacted your pharmacy to request a refill? yes  Which pharmacy would you like this sent to? [facility]   Patient notified that their request is being sent to the clinical staff for review and that they should receive a call once it is complete. If they do not receive a call within 24 hours they can check with their pharmacy or our office.

## 2017-09-09 ENCOUNTER — Other Ambulatory Visit: Payer: Self-pay

## 2017-09-09 ENCOUNTER — Telehealth: Payer: Self-pay | Admitting: Family Medicine

## 2017-09-09 ENCOUNTER — Other Ambulatory Visit (HOSPITAL_COMMUNITY): Payer: Self-pay

## 2017-09-09 ENCOUNTER — Other Ambulatory Visit: Payer: Self-pay | Admitting: Physician Assistant

## 2017-09-09 ENCOUNTER — Ambulatory Visit (INDEPENDENT_AMBULATORY_CARE_PROVIDER_SITE_OTHER): Payer: Medicare Other | Admitting: Psychiatry

## 2017-09-09 ENCOUNTER — Encounter (HOSPITAL_COMMUNITY): Payer: Self-pay | Admitting: Psychiatry

## 2017-09-09 VITALS — BP 117/77 | HR 93 | Ht 62.0 in | Wt 204.8 lb

## 2017-09-09 DIAGNOSIS — Z818 Family history of other mental and behavioral disorders: Secondary | ICD-10-CM | POA: Diagnosis not present

## 2017-09-09 DIAGNOSIS — F3160 Bipolar disorder, current episode mixed, unspecified: Secondary | ICD-10-CM

## 2017-09-09 MED ORDER — FLUCONAZOLE 150 MG PO TABS: 150.0000 mg | ORAL_TABLET | Freq: Once | ORAL | 0 refills | Status: AC

## 2017-09-09 MED ORDER — CARBAMAZEPINE 200 MG PO TABS: 100.0000 mg | ORAL_TABLET | Freq: Two times a day (BID) | ORAL | 5 refills | Status: DC

## 2017-09-09 MED ORDER — CARBAMAZEPINE ER 100 MG PO CP12: 100.0000 mg | ORAL_CAPSULE | Freq: Two times a day (BID) | ORAL | 0 refills | Status: DC

## 2017-09-09 NOTE — Progress Notes (Signed)
Patient ID: Amber Stephenson, female   DOB: Jun 26, 1945, 72 y.o.   MRN: [id number] [facility] MD Progress Note  09/09/2017 3:40 PM Amber Stephenson  MRN:  [id number] Subjective:  Doing well Principal Problem: Bipolar disorder most recent episode manic Diagnosis:  Bipolar disorder most recent  Today the patient says she feels a little wobbly. Her gait is not stable. This seems to come and go with her. When she saw me 2 weeks ago taking what she is taking now she had no problems walking. But in a close evaluation is evident the Neurontin.The patient will be given a referral to Dr. Nicholaus Bloom today. We initially planned taper off the Ativan replaced with Neurontin. Her Tegretol level came back at 8.  Her present imbalance could be due to either the Tegretol or Neurontin or benefit. This is the patient has had past episodes of severe mania. It should be noted that she's on 30 mg of Tegretol. Patient takes Ativan 1 mg in the morning and 2 mg at night. When she tends to reduce that she does not feel comfortable all. Generally the patient does not feel oversedated. Even now she really doesn't feel oversedated. Importantly her mood is doing great. He denies depression or any sets of mania. She is no racing thinking. Her thoughts are organized and connected. She's not better. According to her husband is with her he says she is emotionally very stable Past Medical History:  Past Medical History:  Diagnosis Date  . Atrial fibrillation ([place])   . Bipolar affective ([place])   . CHF (congestive heart failure) ([place])   . Chronic back pain   . Diabetes mellitus without complication ([place])   . Hyperlipidemia   . Hypertension   . Pain management   . Renal insufficiency     Past Surgical History:  Procedure Laterality Date  . BREAST REDUCTION SURGERY    . EYE SURGERY Right    cateracts  . HAMMER TOE SURGERY    . SHOULDER SURGERY Right   . THIGH SURGERY     Family History:  Family History  Problem Relation Age of Onset   . Diabetes Mother   . Heart disease Mother   . Heart disease Brother   . Heart disease Sister        CABG  . Mental illness Brother   . Diabetes Brother    Family Psychiatric  History:  Social History:  Social History   Substance and Sexual Activity  Alcohol Use No  . Alcohol/week: 0.0 oz     Social History   Substance and Sexual Activity  Drug Use No    Social History   Socioeconomic History  . Marital status: Married    Spouse name: Not on file  . Number of children: 3  . Years of education: Not on file  . Highest education level: Not on file  Occupational History  . Not on file  Social Needs  . Financial resource strain: Not on file  . Food insecurity:    Worry: Not on file    Inability: Not on file  . Transportation needs:    Medical: Not on file    Non-medical: Not on file  Tobacco Use  . Smoking status: Never Smoker  . Smokeless tobacco: Never Used  Substance and Sexual Activity  . Alcohol use: No    Alcohol/week: 0.0 oz  . Drug use: No  . Sexual activity: Yes    Partners: Male    Birth control/protection: Post-menopausal  Lifestyle  . Physical activity:    Days per week: Not on file    Minutes per session: Not on file  . Stress: Not on file  Relationships  . Social connections:    Talks on phone: Not on file    Gets together: Not on file    Attends religious service: Not on file    Active member of club or organization: Not on file    Attends meetings of clubs or organizations: Not on file    Relationship status: Not on file  Other Topics Concern  . Not on file  Social History Narrative   Lives at home with husband.    Additional Social History:                         Sleep: Good  Appetite:  Good  Current Medications: Current Outpatient Medications  Medication Sig Dispense Refill  . apixaban (ELIQUIS) 5 MG TABS tablet Take 5 mg by mouth 2 (two) times daily.    . ARIPiprazole (ABILIFY) 10 MG tablet TAKE 3 TABLETS EVERY  MORNING (Patient taking differently: Take 30 mg by mouth daily. ) 270 tablet 2  . atorvastatin (LIPITOR) 40 MG tablet TAKE 1 TABLET DAILY (Patient taking differently: TAKE 1 TABLET (40 MG) BY MOUTH DAILY) 90 tablet 1  . Blood Glucose Monitoring Suppl (FREESTYLE LITE) DEVI Use to check blood sugar tid. DX E11.22 1 each 0  . Cholecalciferol (VITAMIN D3) 1000 UNITS CAPS Take 1,000 Units by mouth daily.    . clotrimazole (LOTRIMIN) 1 % cream APPLY EXTERNALLY TO THE AFFECTED AREA TWICE DAILY 30 g 2  . diltiazem (CARDIZEM CD) 120 MG 24 hr capsule Take 1 capsule (120 mg total) by mouth daily. (Patient taking differently: Take 120 mg by mouth at bedtime. ) 90 capsule 3  . Dulaglutide (TRULICITY) 1.5 ZY/6.0YT SOPN Inject 1.5 mg into the skin once a week. 12 pen 3  . fluconazole (DIFLUCAN) 150 MG tablet Take 1 tablet (150 mg total) by mouth once for 1 dose. 1 tablet 0  . FREESTYLE LITE test strip USE FOR TESTING SUGAR THREE TIMES DAILY 100 each 3  . furosemide (LASIX) 20 MG tablet TAKE 1 TABLET TWICE A DAY (Patient taking differently: TAKE 1 TABLET (20 MG)  TWICE A DAY- MORNING AND 6PM) 180 tablet 1  . gabapentin (NEURONTIN) 100 MG capsule 1  bid 60 capsule 2  . ibuprofen (ADVIL,MOTRIN) 200 MG tablet Take 200 mg by mouth every 6 (six) hours as needed (pain).    . Insulin Pen Needle (SURE COMFORT PEN NEEDLES) 32G X 4 MM MISC Use with insulin pens as directed. Dx E11.9 300 each 11  . Ipratropium-Albuterol (COMBIVENT RESPIMAT) 20-100 MCG/ACT AERS respimat Inhale 1 puff into the lungs every 6 (six) hours. (Patient taking differently: Inhale 1 puff into the lungs every 6 (six) hours as needed for wheezing or shortness of breath. ) 4 g 2  . Lancets (FREESTYLE) lancets Test tid. DX E11.22 100 each 5  . LANTUS SOLOSTAR 100 UNIT/ML Solostar Pen INJECT 38 UNITS UNDER THE SKIN TWICE A DAY (Patient taking differently: INJECT 50 UNITS UNDER THE SKIN TWICE A DAY) 90 mL 2  . levalbuterol (XOPENEX) 1.25 MG/3ML nebulizer  solution Take 1.25 mg by nebulization every 4 (four) hours as needed for wheezing. 72 mL 0  . lidocaine (LIDODERM) 5 % Place 1 patch onto the skin daily. Remove & Discard patch within 12 hours or as directed  by MD (Patient taking differently: Place 1 patch onto the skin daily as needed (pain). Remove & Discard patch within 12 hours or as directed by MD) 30 patch 2  . lidocaine (XYLOCAINE) 2 % solution USE 20 MLS IN THE MOUTH OR THROAT AS NEEDED FOR MOUTH PAIN AS DIRECTED (Patient taking differently: SWISH AND SPIT ENOUGH TO FILL MOUTH DAILY AS NEEDED FOR MOUTH PAIN) 100 mL 0  . LORazepam (ATIVAN) 1 MG tablet Take 1 tablet (1 mg total) by mouth 2 (two) times daily. 90 tablet 2  . metoprolol tartrate (LOPRESSOR) 100 MG tablet TAKE 1 TABLET TWICE A DAY (Patient taking differently: TAKE 1 TABLET (100 MG) BY MOUTH TWICE DAILY WITH  A 25 MG TABLET FOR A TOTAL DOSE OF 125 MG) 180 tablet 3  . metoprolol tartrate (LOPRESSOR) 25 MG tablet TAKE 1 TABLET TWICE A DAY (TO BE TAKEN ALONG WITH THE LOPRESSOR 100 MG TWICE A DAY) (Patient taking differently: TAKE 1 TABLET (25 MG) BY MOUTH TWICE DAILY WITH A 100 MG TABLET FOR A TOTAL DOSE OF 125 MG) 180 tablet 3  . naloxone (NARCAN) nasal spray 4 mg/0.1 mL Place 1 spray into the nose once as needed (opiod overdose).    Marland Kitchen oxyCODONE-acetaminophen (PERCOCET/ROXICET) 5-325 MG tablet Take 1 tablet by mouth every 8 (eight) hours.    . pregabalin (LYRICA) 150 MG capsule Take 1 capsule (150 mg total) by mouth 2 (two) times daily. 180 capsule 1  . rizatriptan (MAXALT-MLT) 10 MG disintegrating tablet DISSOLVE 1 TABLET UNDER THE TONGUE IMMEDIATELY, MAY REPEAT DOSE IN 1 HOUR IF NEEDED. MAX 3 TABLETS IN 24 HOURS (Patient taking differently: Take 10 mg by mouth See admin instructions. Dissolve 1 tablet (10 mg) under the tongue immediately, may repeat dose in 1 hour if needed (max 3 tablets in 24 hours)) 10 tablet 1  . SUMAtriptan (IMITREX) 100 MG tablet TAKE 1 TABLET BY MOUTH AT ONSET OF  HEADACHE. MAY REPEAT IN 2 HOURS IF HEADACHE PERSISTS OR RECURS.LIMIT 2 PER 24 HOURS (Patient taking differently: TAKE 1 TABLET (100 MG) BY MOUTH AT ONSET OF HEADACHE. MAY REPEAT IN 2 HOURS IF HEADACHE PERSISTS OR RECURS.LIMIT 2 PER 24 HOURS) 10 tablet 5  . tizanidine (ZANAFLEX) 6 MG capsule TAKE 1 CAPSULE THREE TIMES A DAY AS NEEDED FOR MUSCLE SPASMS (Patient taking differently: Take 6 mg by mouth 3 (three) times daily as needed for muscle spasms. ) 90 capsule 3  . traZODone (DESYREL) 100 MG tablet 1  qhs and if not effective after 2 nights  Increase to 2  qhs  and  After 2  Days not effective 3  qhs (Patient taking differently: Take 300 mg by mouth at bedtime. ) 90 tablet 2  . TRICOR 145 MG tablet TAKE 1 TABLET DAILY (Patient taking differently: TAKE 1 TABLET (145 MG) BY MOUTH DAILY) 90 tablet 0  . budesonide (ENTOCORT EC) 3 MG 24 hr capsule Take 3 mg by mouth every other day.     . carbamazepine (CARBATROL) 100 MG 12 hr capsule Take 1 capsule (100 mg total) by mouth 2 (two) times daily. 180 capsule 0  . ipratropium-albuterol (DUONEB) 0.5-2.5 (3) MG/3ML SOLN Take 3 mLs by nebulization every 4 (four) hours as needed (shortness of breath/ wheezing).      No current facility-administered medications for this visit.     Lab Results: No results found for this or any previous visit (from the past 48 hour(s)).  Blood Alcohol level:  Lab Results  Component  Value Date   ETH <5 10/01/2015    Physical Findings: AIMS:  , ,  ,  ,    CIWA:    COWS:     Musculoskeletal: Strength & Muscle Tone: within normal limits Gait & Station: normal Patient leans: N/A  Psychiatric Specialty Exam: ROS  Blood pressure 117/77, pulse 93, height 5\' 2"  (1.575 m), weight 204 lb 12.8 oz (92.9 kg), SpO2 97 %.Body mass index is 37.46 kg/m.  General Appearance: Casual  Eye Contact::  Good  Speech:  Clear and Coherent  Volume:  Normal  Mood:  Euthymic  Affect:  Appropriate  Thought Process:  Coherent  Orientation:   Full (Time, Place, and Person)  Thought Content:  WDL  Suicidal Thoughts:  No  Homicidal Thoughts:  No  Memory:  NA  Judgement:  Good  Insight:  Good  Psychomotor Activity:  Normal  Concentration:  Good  Recall:  Good  Fund of Knowledge:Good  Language: Good  Akathisia:  No  Handed:  Right  AIMS (if indicated):     Assets: Good spirits   ADL's:  Intact  Cognition: WNL  Sleep:     eks Treatment Plan Summary: t this time we will reduce her Tegretol from 200 mg twice a day down to a dose of 100 mg twice a day. She'll continue taking Abilify 30 mg and continue Ativan 1 mg one in the morning and night. She'll continue trazodone 300 mg. Lamictal referral to Dr. Hardin Negus. Because she was having some trouble sleeping and she is not having a problem now but because she seems to be sleeping too much we will discontinue the morning Neurontin. Therefore she'll continue taking the lower dose of Tegretol and now taking a lower dose of Neurontin taking only 100 mg at night. We'll see how Dr. Hardin Negus feels about her taking Ativan but I suspect she will have her problems longer she's being followed closely. The patient takes Percocet and she feels her pain is well-controlled on this agent. This patient given a return appointment in 5 weeks butcall if there are problems. Essentially she'll come back less than medications in my hope is that she doesn't feel any mania.

## 2017-09-09 NOTE — Telephone Encounter (Signed)
Last lipid 03/17/16 

## 2017-09-09 NOTE — Telephone Encounter (Signed)
Pt aware.

## 2017-09-09 NOTE — Telephone Encounter (Signed)
Diflucan is sent to [facility].

## 2017-09-10 MED ORDER — FENOFIBRATE 145 MG PO TABS: 145.0000 mg | ORAL_TABLET | Freq: Every day | ORAL | 0 refills | Status: DC

## 2017-09-11 ENCOUNTER — Other Ambulatory Visit: Payer: Self-pay | Admitting: Family Medicine

## 2017-09-11 DIAGNOSIS — J310 Chronic rhinitis: Secondary | ICD-10-CM | POA: Diagnosis not present

## 2017-09-11 NOTE — Telephone Encounter (Signed)
Last seen 08/20/17  Dr Wendi Snipes

## 2017-09-18 ENCOUNTER — Telehealth (HOSPITAL_COMMUNITY): Payer: Self-pay | Admitting: *Deleted

## 2017-09-18 NOTE — Telephone Encounter (Signed)
Pt called requesting information on Carbamapazine she received in the mail. Explained that it is Tegretol and she recognized that drug and was able to verbalize indication and dosage. She had no additional questions

## 2017-09-21 ENCOUNTER — Other Ambulatory Visit: Payer: Self-pay | Admitting: *Deleted

## 2017-09-21 MED ORDER — INSULIN GLARGINE 100 UNIT/ML SOLOSTAR PEN: PEN_INJECTOR | SUBCUTANEOUS | 0 refills | Status: DC

## 2017-09-22 ENCOUNTER — Ambulatory Visit (INDEPENDENT_AMBULATORY_CARE_PROVIDER_SITE_OTHER): Payer: Medicare Other | Admitting: *Deleted

## 2017-09-22 VITALS — BP 113/70 | HR 66 | Ht 62.0 in | Wt 209.2 lb

## 2017-09-22 DIAGNOSIS — Z0000 Encounter for general adult medical examination without abnormal findings: Secondary | ICD-10-CM

## 2017-09-22 NOTE — Patient Instructions (Signed)
  Ms. Laneve , Thank you for taking time to come for your Medicare Wellness Visit. I appreciate your ongoing commitment to your health goals. Please review the following plan we discussed and let me know if I can assist you in the future.   These are the goals we discussed: Goals    . Exercise 3x per week (30 min per time)       This is a list of the screening recommended for you and due dates:  Health Maintenance  Topic Date Due  . Tetanus Vaccine  06/02/1966  . DEXA scan (bone density measurement)  06/02/2012  . Pneumonia vaccines (2 of 2 - PCV13) 05/29/2016  . Urine Protein Check  07/22/2016  . Eye exam for diabetics  01/31/2017  . Hemoglobin A1C  12/06/2017  . Flu Shot  12/31/2017  . Complete foot exam   07/22/2018  . Mammogram  03/25/2019  . Colon Cancer Screening  04/01/2027  .  Hepatitis C: One time screening is recommended by Center for Disease Control  (CDC) for  adults born from [birth year] through 1963.   Completed  Please check on the cost for a TDAP with your insurance  We will administer a pneumonia vaccine at your next visit We will do a dexa scan at your next visit

## 2017-09-22 NOTE — Progress Notes (Addendum)
Subjective:   Amber Stephenson is a 72 y.o. female who presents for an Initial Medicare Annual Wellness Visit. Patient retired from [employer]. She enjoys cooking, reading, and mixing essential oils. Patient states she does not exercise. She states her diet is semi healthy. She states she is very active in her church. She lives in [place] with her husband. They have 3 adult children. She does not have any pets. She does have stairs in the home and some throw rugs in the home. Fall hazards were discussed with patient. Patient has been to the ER on 05/28/17, 08/10/2017, and hospitalized on 05/19/17. Patient states she has not had any surgeries within the last year. Patient states her health is about the same as it was last year at this time. Patient complained with a lot of pain today with her back. She is due for a pneumonia and tdap vaccine but did not feel up to completing today along with her dexa scan.   Review of Systems      Cardiac Risk Factors include: advanced age (>31men, >70 women);diabetes mellitus;hypertension;obesity (BMI >30kg/m2)     Objective:    Today's Vitals   09/22/17 1455  BP: 113/70  Pulse: 66  Weight: 209 lb 3.2 oz (94.9 kg)  Height: 5\' 2"  (1.575 m)  PainSc: 7    Body mass index is 38.26 kg/m.  Advanced Directives 09/22/2017 08/10/2017 05/28/2017 05/20/2017 06/12/2016 10/01/2015 10/01/2015  Does Patient Have a Medical Advance Directive? No No No No No No No  Would patient like information on creating a medical advance directive? Yes (MAU/Ambulatory/Procedural Areas - Information given) - - No - Patient declined - No - patient declined information -  Some encounter information is confidential and restricted. Go to Review Flowsheets activity to see all data.    Current Medications (verified) Outpatient Encounter Medications as of 09/22/2017  Medication Sig  . apixaban (ELIQUIS) 5 MG TABS tablet Take 5 mg by mouth 2 (two) times daily.  . ARIPiprazole (ABILIFY) 10 MG  tablet TAKE 3 TABLETS EVERY MORNING (Patient taking differently: Take 30 mg by mouth daily. )  . atorvastatin (LIPITOR) 40 MG tablet TAKE 1 TABLET DAILY (Patient taking differently: TAKE 1 TABLET (40 MG) BY MOUTH DAILY)  . Blood Glucose Monitoring Suppl (FREESTYLE LITE) DEVI Use to check blood sugar tid. DX E11.22  . carbamazepine (CARBATROL) 100 MG 12 hr capsule Take 1 capsule (100 mg total) by mouth 2 (two) times daily.  . Cholecalciferol (VITAMIN D3) 1000 UNITS CAPS Take 1,000 Units by mouth daily.  . clotrimazole (LOTRIMIN) 1 % cream APPLY EXTERNALLY TO THE AFFECTED AREA TWICE DAILY  . Dulaglutide (TRULICITY) 1.5 IZ/1.2WP SOPN Inject 1.5 mg into the skin once a week.  . fenofibrate (TRICOR) 145 MG tablet Take 1 tablet (145 mg total) by mouth daily.  Marland Kitchen FREESTYLE LITE test strip USE FOR TESTING SUGAR THREE TIMES DAILY  . furosemide (LASIX) 20 MG tablet TAKE 1 TABLET TWICE A DAY (Patient taking differently: TAKE 1 TABLET (20 MG)  TWICE A DAY- MORNING AND 6PM)  . gabapentin (NEURONTIN) 100 MG capsule 1  bid  . Insulin Glargine (LANTUS SOLOSTAR) 100 UNIT/ML Solostar Pen INJECT 50 UNITS UNDER THE SKIN TWICE A DAY  . Insulin Pen Needle (SURE COMFORT PEN NEEDLES) 32G X 4 MM MISC Use with insulin pens as directed. Dx E11.9  . Ipratropium-Albuterol (COMBIVENT RESPIMAT) 20-100 MCG/ACT AERS respimat Inhale 1 puff into the lungs every 6 (six) hours. (Patient taking differently: Inhale 1 puff into  the lungs every 6 (six) hours as needed for wheezing or shortness of breath. )  . ipratropium-albuterol (DUONEB) 0.5-2.5 (3) MG/3ML SOLN Take 3 mLs by nebulization every 4 (four) hours as needed (shortness of breath/ wheezing).   . Lancets (FREESTYLE) lancets Test tid. DX E11.22  . levalbuterol (XOPENEX) 1.25 MG/3ML nebulizer solution Take 1.25 mg by nebulization every 4 (four) hours as needed for wheezing.  . lidocaine (LIDODERM) 5 % Place 1 patch onto the skin daily. Remove & Discard patch within 12 hours or as  directed by MD (Patient taking differently: Place 1 patch onto the skin daily as needed (pain). Remove & Discard patch within 12 hours or as directed by MD)  . lidocaine (XYLOCAINE) 2 % solution USE 20 MLS IN THE MOUTH OR THROAT AS NEEDED FOR MOUTH PAIN AS DIRECTED (Patient taking differently: SWISH AND SPIT ENOUGH TO FILL MOUTH DAILY AS NEEDED FOR MOUTH PAIN)  . LORazepam (ATIVAN) 1 MG tablet Take 1 tablet (1 mg total) by mouth 2 (two) times daily.  . metoprolol tartrate (LOPRESSOR) 100 MG tablet TAKE 1 TABLET TWICE A DAY (Patient taking differently: TAKE 1 TABLET (100 MG) BY MOUTH TWICE DAILY WITH  A 25 MG TABLET FOR A TOTAL DOSE OF 125 MG)  . metoprolol tartrate (LOPRESSOR) 25 MG tablet TAKE 1 TABLET TWICE A DAY (TO BE TAKEN ALONG WITH THE LOPRESSOR 100 MG TWICE A DAY) (Patient taking differently: TAKE 1 TABLET (25 MG) BY MOUTH TWICE DAILY WITH A 100 MG TABLET FOR A TOTAL DOSE OF 125 MG)  . oxyCODONE-acetaminophen (PERCOCET/ROXICET) 5-325 MG tablet Take 1 tablet by mouth every 8 (eight) hours.  . rizatriptan (MAXALT-MLT) 10 MG disintegrating tablet DISSOLVE 1 TABLET UNDER THE TONGUE IMMEDIATELY AT SIGN OF HEADACHE. MAY REPEAT DOSE IN 1 HOUR IF NEEDED. MAX OF 3 TABLETS IN 24 HOURS  . SUMAtriptan (IMITREX) 100 MG tablet TAKE 1 TABLET BY MOUTH AT ONSET OF HEADACHE. MAY REPEAT IN 2 HOURS IF HEADACHE PERSISTS OR RECURS.LIMIT 2 PER 24 HOURS (Patient taking differently: TAKE 1 TABLET (100 MG) BY MOUTH AT ONSET OF HEADACHE. MAY REPEAT IN 2 HOURS IF HEADACHE PERSISTS OR RECURS.LIMIT 2 PER 24 HOURS)  . tizanidine (ZANAFLEX) 6 MG capsule TAKE 1 CAPSULE THREE TIMES A DAY AS NEEDED FOR MUSCLE SPASMS (Patient taking differently: Take 6 mg by mouth 3 (three) times daily as needed for muscle spasms. )  . traZODone (DESYREL) 100 MG tablet 1  qhs and if not effective after 2 nights  Increase to 2  qhs  and  After 2  Days not effective 3  qhs (Patient taking differently: Take 300 mg by mouth at bedtime. )  . diltiazem  (CARDIZEM CD) 120 MG 24 hr capsule Take 1 capsule (120 mg total) by mouth daily. (Patient taking differently: Take 120 mg by mouth at bedtime. )  . naloxone (NARCAN) nasal spray 4 mg/0.1 mL Place 1 spray into the nose once as needed (opiod overdose).  . [DISCONTINUED] budesonide (ENTOCORT EC) 3 MG 24 hr capsule Take 3 mg by mouth every other day.   . [DISCONTINUED] ibuprofen (ADVIL,MOTRIN) 200 MG tablet Take 200 mg by mouth every 6 (six) hours as needed (pain).  . [DISCONTINUED] pregabalin (LYRICA) 150 MG capsule Take 1 capsule (150 mg total) by mouth 2 (two) times daily.   No facility-administered encounter medications on file as of 09/22/2017.     Allergies (verified) Ivp dye [iodinated diagnostic agents]   History: Past Medical History:  Diagnosis Date  .  Atrial fibrillation ([place])   . Bipolar affective ([place])   . CHF (congestive heart failure) ([place])   . Chronic back pain   . Diabetes mellitus without complication ([place])   . Hyperlipidemia   . Hypertension   . Pain management   . Renal insufficiency    Past Surgical History:  Procedure Laterality Date  . BREAST REDUCTION SURGERY    . EYE SURGERY Right    cateracts  . HAMMER TOE SURGERY    . SHOULDER SURGERY Right   . THIGH SURGERY     Family History  Problem Relation Age of Onset  . Diabetes Mother   . Heart disease Mother   . Heart disease Brother   . Heart disease Sister        CABG  . Alcohol abuse Sister   . Mental illness Brother   . Diabetes Brother    Social History   Socioeconomic History  . Marital status: Married    Spouse name: Not on file  . Number of children: 3  . Years of education: 16  . Highest education level: Bachelor's degree (e.g., BA, AB, BS)  Occupational History  . Not on file  Social Needs  . Financial resource strain: Not hard at all  . Food insecurity:    Worry: Never true    Inability: Never true  . Transportation needs:    Medical: No    Non-medical: No  Tobacco Use  .  Smoking status: Never Smoker  . Smokeless tobacco: Never Used  Substance and Sexual Activity  . Alcohol use: No    Alcohol/week: 0.0 oz  . Drug use: No  . Sexual activity: Yes    Partners: Male    Birth control/protection: Post-menopausal  Lifestyle  . Physical activity:    Days per week: 0 days    Minutes per session: 0 min  . Stress: Not at all  Relationships  . Social connections:    Talks on phone: More than three times a week    Gets together: Once a week    Attends religious service: More than 4 times per year    Active member of club or organization: No    Attends meetings of clubs or organizations: Never    Relationship status: Married  Other Topics Concern  . Not on file  Social History Narrative   Lives at home with husband.     Tobacco Counseling Counseling given: Not Answered   Clinical Intake:     Pain : 0-10 Pain Score: 7  Pain Type: Chronic pain Pain Location: Back Pain Descriptors / Indicators: Constant     BMI - recorded: 38.26 Nutritional Status: BMI > 30  Obese Diabetes: Yes CBG done?: No Did pt. bring in CBG monitor from home?: No  How often do you need to have someone help you when you read instructions, pamphlets, or other written materials from your doctor or pharmacy?: 1 - Never  Interpreter Needed?: No      Activities of Daily Living In your present state of health, do you have any difficulty performing the following activities: 09/22/2017 05/20/2017  Hearing? N N  Vision? N N  Difficulty concentrating or making decisions? Y N  Comment Patient states that she has trouble remembering words at time  -  Walking or climbing stairs? Y N  Comment Due to chronic back pain  -  Dressing or bathing? N N  Doing errands, shopping? - Scientist, forensic and eating ? N -  Using the Toilet? N -  In the past six months, have you accidently leaked urine? N -  Do you have problems with loss of bowel control? N -  Managing your Medications? N  -  Managing your Finances? N -  Housekeeping or managing your Housekeeping? Y -  Comment Due to chronic back pain -  Some recent data might be hidden     Immunizations and Health Maintenance Immunization History  Administered Date(s) Administered  . Influenza, High Dose Seasonal PF 03/19/2017  . Influenza,inj,Quad PF,6+ Mos 03/30/2015, 03/17/2016  . Pneumococcal Polysaccharide-23 05/30/2015   Health Maintenance Due  Topic Date Due  . TETANUS/TDAP  06/02/1966  . DEXA SCAN  06/02/2012  . PNA vac Low Risk Adult (2 of 2 - PCV13) 05/29/2016  . URINE MICROALBUMIN  07/22/2016  . OPHTHALMOLOGY EXAM  01/31/2017    Patient Care Team: Timmothy Euler, MD as PCP - General (Family Medicine) Herminio Commons, MD as Attending Physician (Cardiology) Weber Cooks, MD as Referring Physician (Orthopedic Surgery) Norma Fredrickson, MD as Consulting Physician (Psychiatry)  Indicate any recent Medical Services you may have received from other than [facility] providers in the past year (date may be approximate).     Assessment:   This is a routine wellness examination for Lettie.  Hearing/Vision screen No exam data present  Dietary issues and exercise activities discussed: Current Exercise Habits: The patient does not participate in regular exercise at present  Goals    . Exercise 3x per week (30 min per time)      Depression Screen PHQ 2/9 Scores 09/22/2017 08/20/2017 07/22/2017 06/08/2017 04/29/2017 03/19/2017 02/04/2017  PHQ - 2 Score 0 0 0 0 0 0 0  PHQ- 9 Score - - - - - - -  Exception Documentation - - - - - - -    Fall Risk Fall Risk  09/22/2017 08/20/2017 07/22/2017 07/17/2017 06/08/2017  Falls in the past year? Yes Yes Yes Yes Yes  Number falls in past yr: 1 1 1  - 1  Injury with Fall? No No No No Yes  Comment - - - - -  Risk for fall due to : - - - - -    Is the patient's home free of loose throw rugs in walkways, pet beds, electrical cords, etc?   yes      Grab bars in the  bathroom? no      Handrails on the stairs?   yes      Adequate lighting?   yes  Timed Get Up and Go Performed   Cognitive Function: MMSE - Mini Mental State Exam 09/22/2017 04/29/2017  Orientation to time 5 5  Orientation to Place 5 4  Registration 3 3  Attention/ Calculation 5 5  Recall 3 3  Language- name 2 objects 2 2  Language- repeat 1 1  Language- follow 3 step command 3 3  Language- read & follow direction 1 1  Write a sentence 1 1  Copy design 1 1  Total score 30 29        Screening Tests Health Maintenance  Topic Date Due  . TETANUS/TDAP  06/02/1966  . DEXA SCAN  06/02/2012  . PNA vac Low Risk Adult (2 of 2 - PCV13) 05/29/2016  . URINE MICROALBUMIN  07/22/2016  . OPHTHALMOLOGY EXAM  01/31/2017  . HEMOGLOBIN A1C  12/06/2017  . INFLUENZA VACCINE  12/31/2017  . FOOT EXAM  07/22/2018  . MAMMOGRAM  03/25/2019  . COLONOSCOPY  04/01/2027  .  Hepatitis C Screening  Completed    Qualifies for Shingles Vaccine?   Cancer Screenings: Lung: Low Dose CT Chest recommended if Age 30-80 years, 30 pack-year currently smoking OR have quit w/in 15years. Patient does not qualify. Breast: Up to date on Mammogram? Yes   Up to date of Bone Density/Dexa? No Patient states she will have at her next visit. Patient was ready to go home due to back pain  Colorectal: Up to date   Additional Screenings: Hepatitis C Screening: Completed     Plan:    Patient will have pneumonia vaccine at next visit Patient will check with [employer] to see if they cover TDap Patient will have Dexa scan at next visit Patient will call and schedule Diabetic eye exam  I have personally reviewed and noted the following in the patient's chart:   . Medical and social history . Use of alcohol, tobacco or illicit drugs  . Current medications and supplements . Functional ability and status . Nutritional status . Physical activity . Advanced directives . List of other physicians . Hospitalizations,  surgeries, and ER visits in previous 12 months . Vitals . Screenings to include cognitive, depression, and falls . Referrals and appointments  In addition, I have reviewed and discussed with patient certain preventive protocols, quality metrics, and best practice recommendations. A written personalized care plan for preventive services as well as general preventive health recommendations were provided to patient.     Gareth Morgan, LPN   [id number]   I have reviewed and agree with the above AWV documentation.   Laroy Apple, MD [facility] 09/22/2017, 5:14 PM

## 2017-09-24 DIAGNOSIS — M62830 Muscle spasm of back: Secondary | ICD-10-CM | POA: Diagnosis not present

## 2017-09-24 DIAGNOSIS — M545 Low back pain: Secondary | ICD-10-CM | POA: Diagnosis not present

## 2017-09-24 DIAGNOSIS — M47816 Spondylosis without myelopathy or radiculopathy, lumbar region: Secondary | ICD-10-CM | POA: Diagnosis not present

## 2017-09-24 DIAGNOSIS — M9903 Segmental and somatic dysfunction of lumbar region: Secondary | ICD-10-CM | POA: Diagnosis not present

## 2017-09-25 ENCOUNTER — Ambulatory Visit (INDEPENDENT_AMBULATORY_CARE_PROVIDER_SITE_OTHER): Payer: Medicare Other | Admitting: Psychiatry

## 2017-09-25 ENCOUNTER — Encounter (HOSPITAL_COMMUNITY): Payer: Self-pay | Admitting: Psychiatry

## 2017-09-25 VITALS — BP 104/70 | HR 72 | Ht 62.0 in | Wt 207.0 lb

## 2017-09-25 DIAGNOSIS — M47816 Spondylosis without myelopathy or radiculopathy, lumbar region: Secondary | ICD-10-CM | POA: Diagnosis not present

## 2017-09-25 DIAGNOSIS — M545 Low back pain: Secondary | ICD-10-CM | POA: Diagnosis not present

## 2017-09-25 DIAGNOSIS — F3174 Bipolar disorder, in full remission, most recent episode manic: Secondary | ICD-10-CM | POA: Diagnosis not present

## 2017-09-25 DIAGNOSIS — M5136 Other intervertebral disc degeneration, lumbar region: Secondary | ICD-10-CM | POA: Diagnosis not present

## 2017-09-25 DIAGNOSIS — Z79899 Other long term (current) drug therapy: Secondary | ICD-10-CM | POA: Diagnosis not present

## 2017-09-25 DIAGNOSIS — Z811 Family history of alcohol abuse and dependence: Secondary | ICD-10-CM | POA: Diagnosis not present

## 2017-09-25 DIAGNOSIS — Z818 Family history of other mental and behavioral disorders: Secondary | ICD-10-CM

## 2017-09-25 DIAGNOSIS — M9903 Segmental and somatic dysfunction of lumbar region: Secondary | ICD-10-CM | POA: Diagnosis not present

## 2017-09-25 DIAGNOSIS — M62830 Muscle spasm of back: Secondary | ICD-10-CM | POA: Diagnosis not present

## 2017-09-25 MED ORDER — PREGABALIN 150 MG PO CAPS: 150.0000 mg | ORAL_CAPSULE | Freq: Two times a day (BID) | ORAL | 2 refills | Status: DC

## 2017-09-25 MED ORDER — PREGABALIN 150 MG PO CAPS
150.0000 mg | ORAL_CAPSULE | Freq: Two times a day (BID) | ORAL | 2 refills | Status: DC
Start: 2017-09-25 — End: 2017-10-09

## 2017-09-25 MED ORDER — LORAZEPAM 1 MG PO TABS: 1.0000 mg | ORAL_TABLET | Freq: Two times a day (BID) | ORAL | 2 refills | Status: DC

## 2017-09-25 NOTE — Progress Notes (Signed)
Patient ID: Amber Stephenson, female   DOB: Jan 06, 1946, 72 y.o.   MRN: [id number] [facility] MD Progress Note .she's going to apply 09/25/2017 12:08 PM Amber Stephenson  MRN:  [id number] Subjective:  Doing well Principal Problem: Bipolar disorder most recent episode manic Diagnosis:  Bipolar disorder most recent   Emotionally this patient is doing pretty well. She's having no signs of mania. She's having complications with the fact that I have given her Ativan over the last 6 months to year and has been very effective. It is made her feel much calmer. It is not oversedated. To be clear she takes 1 mg in the morning and 2 mg at night. Her pain doctor however refuses to give her opiates while she takes Ativan. The patient doesn't understand this. She needs both and she'll attempt to get likely going to a different pain doctor. She actually sees the pain doctor today but she'll take the position of problem 1 week to get the Ativan as months as she wants the opiates. On the other hand the patient is sleeping poorly but this been the case for years. She takes naps. She has a low energy level. Patient has no evidence of psychosis. She is no evidence or right rapid thoughts or grandiosity. She shows no symptoms of mania doing very well this regard. We reduce her Tegretol 200 mg twice a day it seems to be working well.She takes a high dose of Abilify 30 mg and she takes Ativan 1 mg in the morning 2 at night. She still takes trazodone and claims the trazodone plus the Ativan does help her sleep albeit not great. Today the patient describes having a sense of numbness and tingling and strange feelings since we started the Neurontin she is very uncomfortable. It makes her feel strange.Therefore we will discontinue her Neurontin. Her previous doctor was giving her Lyrica for her neuropathy and I will go ahead and re-prescribing for now. She gets 150 mg twice a day. Unfortunately has been a lot of confusion issues with getting into a  pain clinic. [facility]. At this time the patient is not suicidal. She appears somewhat cautious but I think that because evidence of multisystem problems. Past Medical History:  Past Medical History:  Diagnosis Date  . Atrial fibrillation ([place])   . Bipolar affective ([place])   . CHF (congestive heart failure) ([place])   . Chronic back pain   . Diabetes mellitus without complication ([place])   . Hyperlipidemia   . Hypertension   . Pain management   . Renal insufficiency     Past Surgical History:  Procedure Laterality Date  . BREAST REDUCTION SURGERY    . EYE SURGERY Right    cateracts  . HAMMER TOE SURGERY    . SHOULDER SURGERY Right   . THIGH SURGERY     Family History:  Family History  Problem Relation Age of Onset  . Diabetes Mother   . Heart disease Mother   . Heart disease Brother   . Heart disease Sister        CABG  . Alcohol abuse Sister   . Mental illness Brother   . Diabetes Brother    Family Psychiatric  History:  Social History:  Social History   Substance and Sexual Activity  Alcohol Use No  . Alcohol/week: 0.0 oz     Social History   Substance and Sexual Activity  Drug Use No    Social History  Socioeconomic History  . Marital status: Married    Spouse name: Not on file  . Number of children: 3  . Years of education: 16  . Highest education level: Bachelor's degree (e.g., BA, AB, BS)  Occupational History  . Not on file  Social Needs  . Financial resource strain: Not hard at all  . Food insecurity:    Worry: Never true    Inability: Never true  . Transportation needs:    Medical: No    Non-medical: No  Tobacco Use  . Smoking status: Never Smoker  . Smokeless tobacco: Never Used  Substance and Sexual Activity  . Alcohol use: No    Alcohol/week: 0.0 oz  . Drug use: No  . Sexual activity: Yes    Partners: Male    Birth control/protection: Post-menopausal  Lifestyle  . Physical activity:    Days per week:  0 days    Minutes per session: 0 min  . Stress: Not at all  Relationships  . Social connections:    Talks on phone: More than three times a week    Gets together: Once a week    Attends religious service: More than 4 times per year    Active member of club or organization: No    Attends meetings of clubs or organizations: Never    Relationship status: Married  Other Topics Concern  . Not on file  Social History Narrative   Lives at home with husband.    Additional Social History:                         Sleep: Good  Appetite:  Good  Current Medications: Current Outpatient Medications  Medication Sig Dispense Refill  . apixaban (ELIQUIS) 5 MG TABS tablet Take 5 mg by mouth 2 (two) times daily.    . ARIPiprazole (ABILIFY) 10 MG tablet TAKE 3 TABLETS EVERY MORNING (Patient taking differently: Take 30 mg by mouth daily. ) 270 tablet 2  . atorvastatin (LIPITOR) 40 MG tablet TAKE 1 TABLET DAILY (Patient taking differently: TAKE 1 TABLET (40 MG) BY MOUTH DAILY) 90 tablet 1  . Blood Glucose Monitoring Suppl (FREESTYLE LITE) DEVI Use to check blood sugar tid. DX E11.22 1 each 0  . carbamazepine (CARBATROL) 100 MG 12 hr capsule Take 1 capsule (100 mg total) by mouth 2 (two) times daily. 180 capsule 0  . Cholecalciferol (VITAMIN D3) 1000 UNITS CAPS Take 1,000 Units by mouth daily.    . clotrimazole (LOTRIMIN) 1 % cream APPLY EXTERNALLY TO THE AFFECTED AREA TWICE DAILY 30 g 2  . diltiazem (CARDIZEM CD) 120 MG 24 hr capsule Take 1 capsule (120 mg total) by mouth daily. (Patient taking differently: Take 120 mg by mouth at bedtime. ) 90 capsule 3  . Dulaglutide (TRULICITY) 1.5 ZC/5.8IF SOPN Inject 1.5 mg into the skin once a week. 12 pen 3  . fenofibrate (TRICOR) 145 MG tablet Take 1 tablet (145 mg total) by mouth daily. 90 tablet 0  . FREESTYLE LITE test strip USE FOR TESTING SUGAR THREE TIMES DAILY 100 each 3  . furosemide (LASIX) 20 MG tablet TAKE 1 TABLET TWICE A DAY (Patient  taking differently: TAKE 1 TABLET (20 MG)  TWICE A DAY- MORNING AND 6PM) 180 tablet 1  . Insulin Glargine (LANTUS SOLOSTAR) 100 UNIT/ML Solostar Pen INJECT 50 UNITS UNDER THE SKIN TWICE A DAY 90 mL 0  . Insulin Pen Needle (SURE COMFORT PEN NEEDLES) 32G X 4  MM MISC Use with insulin pens as directed. Dx E11.9 300 each 11  . Ipratropium-Albuterol (COMBIVENT RESPIMAT) 20-100 MCG/ACT AERS respimat Inhale 1 puff into the lungs every 6 (six) hours. (Patient taking differently: Inhale 1 puff into the lungs every 6 (six) hours as needed for wheezing or shortness of breath. ) 4 g 2  . ipratropium-albuterol (DUONEB) 0.5-2.5 (3) MG/3ML SOLN Take 3 mLs by nebulization every 4 (four) hours as needed (shortness of breath/ wheezing).     . Lancets (FREESTYLE) lancets Test tid. DX E11.22 100 each 5  . levalbuterol (XOPENEX) 1.25 MG/3ML nebulizer solution Take 1.25 mg by nebulization every 4 (four) hours as needed for wheezing. 72 mL 0  . lidocaine (LIDODERM) 5 % Place 1 patch onto the skin daily. Remove & Discard patch within 12 hours or as directed by MD (Patient taking differently: Place 1 patch onto the skin daily as needed (pain). Remove & Discard patch within 12 hours or as directed by MD) 30 patch 2  . lidocaine (XYLOCAINE) 2 % solution USE 20 MLS IN THE MOUTH OR THROAT AS NEEDED FOR MOUTH PAIN AS DIRECTED (Patient taking differently: SWISH AND SPIT ENOUGH TO FILL MOUTH DAILY AS NEEDED FOR MOUTH PAIN) 100 mL 0  . LORazepam (ATIVAN) 1 MG tablet Take 1 tablet (1 mg total) by mouth 2 (two) times daily. 90 tablet 2  . metoprolol tartrate (LOPRESSOR) 100 MG tablet TAKE 1 TABLET TWICE A DAY (Patient taking differently: TAKE 1 TABLET (100 MG) BY MOUTH TWICE DAILY WITH  A 25 MG TABLET FOR A TOTAL DOSE OF 125 MG) 180 tablet 3  . metoprolol tartrate (LOPRESSOR) 25 MG tablet TAKE 1 TABLET TWICE A DAY (TO BE TAKEN ALONG WITH THE LOPRESSOR 100 MG TWICE A DAY) (Patient taking differently: TAKE 1 TABLET (25 MG) BY MOUTH TWICE  DAILY WITH A 100 MG TABLET FOR A TOTAL DOSE OF 125 MG) 180 tablet 3  . naloxone (NARCAN) nasal spray 4 mg/0.1 mL Place 1 spray into the nose once as needed (opiod overdose).    Marland Kitchen oxyCODONE-acetaminophen (PERCOCET/ROXICET) 5-325 MG tablet Take 1 tablet by mouth every 8 (eight) hours.    . pregabalin (LYRICA) 150 MG capsule Take 1 capsule (150 mg total) by mouth 2 (two) times daily. 180 capsule 2  . rizatriptan (MAXALT-MLT) 10 MG disintegrating tablet DISSOLVE 1 TABLET UNDER THE TONGUE IMMEDIATELY AT SIGN OF HEADACHE. MAY REPEAT DOSE IN 1 HOUR IF NEEDED. MAX OF 3 TABLETS IN 24 HOURS 10 tablet 3  . SUMAtriptan (IMITREX) 100 MG tablet TAKE 1 TABLET BY MOUTH AT ONSET OF HEADACHE. MAY REPEAT IN 2 HOURS IF HEADACHE PERSISTS OR RECURS.LIMIT 2 PER 24 HOURS (Patient taking differently: TAKE 1 TABLET (100 MG) BY MOUTH AT ONSET OF HEADACHE. MAY REPEAT IN 2 HOURS IF HEADACHE PERSISTS OR RECURS.LIMIT 2 PER 24 HOURS) 10 tablet 5  . tizanidine (ZANAFLEX) 6 MG capsule TAKE 1 CAPSULE THREE TIMES A DAY AS NEEDED FOR MUSCLE SPASMS (Patient taking differently: Take 6 mg by mouth 3 (three) times daily as needed for muscle spasms. ) 90 capsule 3  . traZODone (DESYREL) 100 MG tablet 1  qhs and if not effective after 2 nights  Increase to 2  qhs  and  After 2  Days not effective 3  qhs (Patient taking differently: Take 300 mg by mouth at bedtime. ) 90 tablet 2   No current facility-administered medications for this visit.     Lab Results: No results found for this  or any previous visit (from the past 48 hour(s)).  Blood Alcohol level:  Lab Results  Component Value Date   ETH <5 10/01/2015    Physical Findings: AIMS:  , ,  ,  ,    CIWA:    COWS:     Musculoskeletal: Strength & Muscle Tone: within normal limits Gait & Station: normal Patient leans: N/A  Psychiatric Specialty Exam: ROS  Blood pressure 104/70, pulse 72, height 5\' 2"  (1.575 m), weight 207 lb (93.9 kg), SpO2 93 %.Body mass index is 37.86 kg/m.   General Appearance: Casual  Eye Contact::  Good  Speech:  Clear and Coherent  Volume:  Normal  Mood:  Euthymic  Affect:  Appropriate  Thought Process:  Coherent  Orientation:  Full (Time, Place, and Person)  Thought Content:  WDL  Suicidal Thoughts:  No  Homicidal Thoughts:  No  Memory:  NA  Judgement:  Good  Insight:  Good  Psychomotor Activity:  Normal  Concentration:  Good  Recall:  Good  Fund of Knowledge:Good  Language: Good  Akathisia:  No  Handed:  Right  AIMS (if indicated):     Assets: Good spirits   ADL's:  Intact  Cognition: WNL  Sleep:     eks Treatment Plan Summary: At this time the good news is the patient is done well on a lower dose of Tegretol. She does well taking 30 mg of Abilify. She shows no evidence tardive dyskinesia. She still take Ativan 1 mg one in the morning and 2 at night.She is not oversedated. She abuses no drugs drinks no alcohol. She does take trazodone 300 mg for sleep daily we'll reestablish her Lyrica at 150 mg twice a day. The patient will continue it. Pain management. She'll return to see me in about 7 weeks for a 30 minute visit. Patient is not suicidal. She is seen with her husband. She is stable.

## 2017-09-28 DIAGNOSIS — M9903 Segmental and somatic dysfunction of lumbar region: Secondary | ICD-10-CM | POA: Diagnosis not present

## 2017-09-28 DIAGNOSIS — M47816 Spondylosis without myelopathy or radiculopathy, lumbar region: Secondary | ICD-10-CM | POA: Diagnosis not present

## 2017-09-28 DIAGNOSIS — M62830 Muscle spasm of back: Secondary | ICD-10-CM | POA: Diagnosis not present

## 2017-09-28 DIAGNOSIS — M545 Low back pain: Secondary | ICD-10-CM | POA: Diagnosis not present

## 2017-09-29 ENCOUNTER — Other Ambulatory Visit (HOSPITAL_COMMUNITY): Payer: Self-pay

## 2017-09-29 DIAGNOSIS — K52838 Other microscopic colitis: Secondary | ICD-10-CM | POA: Diagnosis not present

## 2017-09-29 MED ORDER — LORAZEPAM 1 MG PO TABS: ORAL_TABLET | ORAL | 2 refills | Status: DC

## 2017-10-01 ENCOUNTER — Encounter: Payer: Self-pay | Admitting: Family Medicine

## 2017-10-01 ENCOUNTER — Ambulatory Visit (INDEPENDENT_AMBULATORY_CARE_PROVIDER_SITE_OTHER): Payer: Medicare Other | Admitting: Family Medicine

## 2017-10-01 VITALS — BP 128/83 | HR 85 | Temp 97.0°F | Ht 62.0 in | Wt 209.8 lb

## 2017-10-01 DIAGNOSIS — N3942 Incontinence without sensory awareness: Secondary | ICD-10-CM | POA: Diagnosis not present

## 2017-10-01 DIAGNOSIS — Z780 Asymptomatic menopausal state: Secondary | ICD-10-CM

## 2017-10-01 LAB — MICROSCOPIC EXAMINATION
RBC, UA: NONE SEEN /hpf (ref 0–2)
Renal Epithel, UA: NONE SEEN /hpf

## 2017-10-01 LAB — URINALYSIS, COMPLETE
Bilirubin, UA: NEGATIVE
Ketones, UA: NEGATIVE
Nitrite, UA: NEGATIVE
Protein, UA: NEGATIVE
RBC, UA: NEGATIVE
Specific Gravity, UA: 1.01 (ref 1.005–1.030)
Urobilinogen, Ur: 0.2 mg/dL (ref 0.2–1.0)
pH, UA: 5.5 (ref 5.0–7.5)

## 2017-10-01 NOTE — Progress Notes (Signed)
   HPI  Patient presents today for urinary incontinence, also to discuss Prevnar and DEXA scan.  Urinary incontinence Is been going on for 2 weeks or so.  Patient states that she is wetting her pants without sensation to go to the bathroom.  It happens mostly at night, however this morning she had a flow that was continuous that she could not sense. She is maintaining continence currently. Denies dysuria, fever, chills, sweats, or difficulty tolerating foods or fluids.  PMH: Smoking status noted ROS: Per HPI  Objective: BP 128/83   Pulse 85   Temp (!) 97 F (36.1 C) (Oral)   Ht 5\' 2"  (1.575 m)   Wt 209 lb 12.8 oz (95.2 kg)   BMI 38.37 kg/m  Gen: NAD, alert, cooperative with exam HEENT: NCAT CV: RRR, good S1/S2, no murmur Resp: CTABL, no wheezes, non-labored Abd: SNTND, BS present, no guarding or organomegaly Ext: No edema, warm Neuro: Alert and oriented, No gross deficits  Assessment and plan:  #Urinary incontinence Check UA for signs of UTI, without sensation and mostly around night, with preserved continence during the daytime I will go ahead and refer her to urology.   #Postmenopausal, screening for osteoporosis Screening DEXA ordered     Orders Placed This Encounter  Procedures  . Urine Culture  . DG [facility] DEXA    Order Specific Question:   Reason for Exam (SYMPTOM  OR DIAGNOSIS REQUIRED)    Answer:   screening  . Urinalysis, Complete  . Ambulatory referral to Urology    Referral Priority:   Routine    Referral Type:   Consultation    Referral Reason:   Specialty Services Required    Referred to Provider:   Franchot Gallo, MD    Requested Specialty:   Urology    Number of Visits Requested:   1    No orders of the defined types were placed in this encounter.   Laroy Apple, MD [name] 10/01/2017, 1:40 PM

## 2017-10-01 NOTE — Patient Instructions (Signed)
Great to see you!   

## 2017-10-02 ENCOUNTER — Ambulatory Visit: Payer: Medicare Other | Admitting: Family Medicine

## 2017-10-02 LAB — URINE CULTURE

## 2017-10-05 ENCOUNTER — Encounter: Payer: Self-pay | Admitting: Cardiovascular Disease

## 2017-10-05 ENCOUNTER — Ambulatory Visit (INDEPENDENT_AMBULATORY_CARE_PROVIDER_SITE_OTHER): Payer: Medicare Other | Admitting: Cardiovascular Disease

## 2017-10-05 VITALS — BP 102/60 | HR 70 | Ht 62.0 in | Wt 210.0 lb

## 2017-10-05 DIAGNOSIS — I482 Chronic atrial fibrillation: Secondary | ICD-10-CM

## 2017-10-05 DIAGNOSIS — E782 Mixed hyperlipidemia: Secondary | ICD-10-CM

## 2017-10-05 DIAGNOSIS — I5032 Chronic diastolic (congestive) heart failure: Secondary | ICD-10-CM | POA: Diagnosis not present

## 2017-10-05 DIAGNOSIS — R0600 Dyspnea, unspecified: Secondary | ICD-10-CM

## 2017-10-05 DIAGNOSIS — I10 Essential (primary) hypertension: Secondary | ICD-10-CM

## 2017-10-05 DIAGNOSIS — I4821 Permanent atrial fibrillation: Secondary | ICD-10-CM

## 2017-10-05 DIAGNOSIS — R0609 Other forms of dyspnea: Secondary | ICD-10-CM | POA: Diagnosis not present

## 2017-10-05 MED ORDER — PREDNISONE 50 MG PO TABS: ORAL_TABLET | ORAL | 0 refills | Status: DC

## 2017-10-05 MED ORDER — FUROSEMIDE 20 MG PO TABS: 20.0000 mg | ORAL_TABLET | Freq: Two times a day (BID) | ORAL | 1 refills | Status: DC | PRN

## 2017-10-05 NOTE — Addendum Note (Signed)
Addended by: Barbarann Ehlers A on: 10/05/2017 02:29 PM   Modules accepted: Orders

## 2017-10-05 NOTE — Patient Instructions (Addendum)
Your physician recommends that you schedule a follow-up appointment in:  3 months with Dr Bronson Ing    Your physician recommends that you continue on your current medications as directed. Please refer to the Current Medication list given to you today.   You may now take your Lasix only as needed    Please arrive at the [facility] main entrance of [facility] at xx:xx AM (30-45 minutes prior to test start time)  [facility] [place], [place] [address] [phone]  Proceed to the [facility] Radiology Department (First Floor).  Please follow these instructions carefully (unless otherwise directed):    On the Night Before the Test: . Drink plenty of water. . Do not consume any caffeinated/decaffeinated beverages or chocolate 12 hours prior to your test. . Do not take any antihistamines 12 hours prior to your test. . If you take Metformin do not take 24 hours prior to test. . If the patient has contrast allergy: ? Patient will need a prescription for Prednisone and very clear instructions (as follows): 1. Prednisone 50 mg - take 13 hours prior to test 2. Take another Prednisone 50 mg 7 hours prior to test 3. Take another Prednisone 50 mg 1 hour prior to test 4. Take Benadryl 50 mg 1 hour prior to test . Patient must complete all four doses of above prophylactic medications. . Patient will need a ride after test due to Benadryl.  On the Day of the Test: . Drink plenty of water. Do not drink any water within one hour of the test. . Do not eat any food 4 hours prior to the test. . You may take your regular medications prior to the test. . IF NOT ON A BETA BLOCKER - Take 50 mg of lopressor (metoprolol) one hour before the test. . HOLD Furosemide morning of the test.  After the Test: . Drink plenty of water. . After receiving IV contrast, you may experience a mild flushed feeling. This is normal. . On occasion, you may experience a mild  rash up to 24 hours after the test. This is not dangerous. If this occurs, you can take Benadryl 25 mg and increase your fluid intake. . If you experience trouble breathing, this can be serious. If it is severe call 911 IMMEDIATELY. If it is mild, please call our office. . If you take any of these medications: Glipizide/Metformin, Avandament, Glucavance, please do not take 48 hours after completing test.

## 2017-10-05 NOTE — Progress Notes (Signed)
SUBJECTIVE: The patient presents for follow-up of atrial fibrillation and chronic diastolic heart failure.  She has chronic exertional dyspnea which is stable.  However, she says taking a shower exacerbated she also complains of frequent urination with Lasix.  She takes it at 8 AM and 6 PM.  She complains of vaginal soreness due to frequent wiping.  She denies chest pain, leg swelling, orthopnea, and palpitations.  She is a non-smoker.  She underwent a low risk nuclear stress test on 06/20/2015.    Review of Systems: As per "subjective", otherwise negative.  Allergies  Allergen Reactions  . Ivp Dye [Iodinated Diagnostic Agents] Anaphylaxis and Swelling    Throat closes    Current Outpatient Medications  Medication Sig Dispense Refill  . apixaban (ELIQUIS) 5 MG TABS tablet Take 5 mg by mouth 2 (two) times daily.    . ARIPiprazole (ABILIFY) 10 MG tablet TAKE 3 TABLETS EVERY MORNING (Patient taking differently: Take 30 mg by mouth daily. ) 270 tablet 2  . atorvastatin (LIPITOR) 40 MG tablet TAKE 1 TABLET DAILY (Patient taking differently: TAKE 1 TABLET (40 MG) BY MOUTH DAILY) 90 tablet 1  . Blood Glucose Monitoring Suppl (FREESTYLE LITE) DEVI Use to check blood sugar tid. DX E11.22 1 each 0  . carbamazepine (CARBATROL) 100 MG 12 hr capsule Take 1 capsule (100 mg total) by mouth 2 (two) times daily. 180 capsule 0  . Cholecalciferol (VITAMIN D3) 1000 UNITS CAPS Take 1,000 Units by mouth daily.    . clotrimazole (LOTRIMIN) 1 % cream APPLY EXTERNALLY TO THE AFFECTED AREA TWICE DAILY 30 g 2  . diltiazem (CARDIZEM CD) 120 MG 24 hr capsule Take 1 capsule (120 mg total) by mouth daily. (Patient taking differently: Take 120 mg by mouth at bedtime. ) 90 capsule 3  . Dulaglutide (TRULICITY) 1.5 VO/1.6WV SOPN Inject 1.5 mg into the skin once a week. 12 pen 3  . fenofibrate (TRICOR) 145 MG tablet Take 1 tablet (145 mg total) by mouth daily. 90 tablet 0  . FREESTYLE LITE test strip USE FOR  TESTING SUGAR THREE TIMES DAILY 100 each 3  . furosemide (LASIX) 20 MG tablet TAKE 1 TABLET TWICE A DAY (Patient taking differently: TAKE 1 TABLET (20 MG)  TWICE A DAY- MORNING AND 6PM) 180 tablet 1  . Insulin Glargine (LANTUS SOLOSTAR) 100 UNIT/ML Solostar Pen INJECT 50 UNITS UNDER THE SKIN TWICE A DAY 90 mL 0  . Insulin Pen Needle (SURE COMFORT PEN NEEDLES) 32G X 4 MM MISC Use with insulin pens as directed. Dx E11.9 300 each 11  . Ipratropium-Albuterol (COMBIVENT RESPIMAT) 20-100 MCG/ACT AERS respimat Inhale 1 puff into the lungs every 6 (six) hours. (Patient taking differently: Inhale 1 puff into the lungs every 6 (six) hours as needed for wheezing or shortness of breath. ) 4 g 2  . ipratropium-albuterol (DUONEB) 0.5-2.5 (3) MG/3ML SOLN Take 3 mLs by nebulization every 4 (four) hours as needed (shortness of breath/ wheezing).     . Lancets (FREESTYLE) lancets Test tid. DX E11.22 100 each 5  . levalbuterol (XOPENEX) 1.25 MG/3ML nebulizer solution Take 1.25 mg by nebulization every 4 (four) hours as needed for wheezing. 72 mL 0  . lidocaine (LIDODERM) 5 % Place 1 patch onto the skin daily. Remove & Discard patch within 12 hours or as directed by MD (Patient taking differently: Place 1 patch onto the skin daily as needed (pain). Remove & Discard patch within 12 hours or as directed by MD)  30 patch 2  . lidocaine (XYLOCAINE) 2 % solution USE 20 MLS IN THE MOUTH OR THROAT AS NEEDED FOR MOUTH PAIN AS DIRECTED (Patient taking differently: SWISH AND SPIT ENOUGH TO FILL MOUTH DAILY AS NEEDED FOR MOUTH PAIN) 100 mL 0  . LORazepam (ATIVAN) 1 MG tablet Take 1 tab (1 mg) in the morning and 2 tabs (2 mg ) at bedtime 90 tablet 2  . metoprolol tartrate (LOPRESSOR) 100 MG tablet TAKE 1 TABLET TWICE A DAY (Patient taking differently: TAKE 1 TABLET (100 MG) BY MOUTH TWICE DAILY WITH  A 25 MG TABLET FOR A TOTAL DOSE OF 125 MG) 180 tablet 3  . metoprolol tartrate (LOPRESSOR) 25 MG tablet TAKE 1 TABLET TWICE A DAY (TO BE  TAKEN ALONG WITH THE LOPRESSOR 100 MG TWICE A DAY) (Patient taking differently: TAKE 1 TABLET (25 MG) BY MOUTH TWICE DAILY WITH A 100 MG TABLET FOR A TOTAL DOSE OF 125 MG) 180 tablet 3  . naloxone (NARCAN) nasal spray 4 mg/0.1 mL Place 1 spray into the nose once as needed (opiod overdose).    Marland Kitchen oxyCODONE-acetaminophen (PERCOCET/ROXICET) 5-325 MG tablet Take 1 tablet by mouth every 8 (eight) hours.    . pregabalin (LYRICA) 150 MG capsule Take 1 capsule (150 mg total) by mouth 2 (two) times daily. 180 capsule 2  . rizatriptan (MAXALT-MLT) 10 MG disintegrating tablet DISSOLVE 1 TABLET UNDER THE TONGUE IMMEDIATELY AT SIGN OF HEADACHE. MAY REPEAT DOSE IN 1 HOUR IF NEEDED. MAX OF 3 TABLETS IN 24 HOURS 10 tablet 3  . SUMAtriptan (IMITREX) 100 MG tablet TAKE 1 TABLET BY MOUTH AT ONSET OF HEADACHE. MAY REPEAT IN 2 HOURS IF HEADACHE PERSISTS OR RECURS.LIMIT 2 PER 24 HOURS (Patient taking differently: TAKE 1 TABLET (100 MG) BY MOUTH AT ONSET OF HEADACHE. MAY REPEAT IN 2 HOURS IF HEADACHE PERSISTS OR RECURS.LIMIT 2 PER 24 HOURS) 10 tablet 5  . tizanidine (ZANAFLEX) 6 MG capsule TAKE 1 CAPSULE THREE TIMES A DAY AS NEEDED FOR MUSCLE SPASMS (Patient taking differently: Take 6 mg by mouth 3 (three) times daily as needed for muscle spasms. ) 90 capsule 3  . traZODone (DESYREL) 100 MG tablet 1  qhs and if not effective after 2 nights  Increase to 2  qhs  and  After 2  Days not effective 3  qhs (Patient taking differently: Take 300 mg by mouth at bedtime. ) 90 tablet 2   No current facility-administered medications for this visit.     Past Medical History:  Diagnosis Date  . Atrial fibrillation ([place])   . Bipolar affective ([place])   . CHF (congestive heart failure) ([place])   . Chronic back pain   . Diabetes mellitus without complication ([place])   . Hyperlipidemia   . Hypertension   . Pain management   . Renal insufficiency     Past Surgical History:  Procedure Laterality Date  . BREAST REDUCTION SURGERY    . EYE  SURGERY Right    cateracts  . HAMMER TOE SURGERY    . SHOULDER SURGERY Right   . THIGH SURGERY      Social History   Socioeconomic History  . Marital status: Married    Spouse name: Not on file  . Number of children: 3  . Years of education: 16  . Highest education level: Bachelor's degree (e.g., BA, AB, BS)  Occupational History  . Not on file  Social Needs  . Financial resource strain: Not hard at all  . Food  insecurity:    Worry: Never true    Inability: Never true  . Transportation needs:    Medical: No    Non-medical: No  Tobacco Use  . Smoking status: Never Smoker  . Smokeless tobacco: Never Used  Substance and Sexual Activity  . Alcohol use: No    Alcohol/week: 0.0 oz  . Drug use: No  . Sexual activity: Yes    Partners: Male    Birth control/protection: Post-menopausal  Lifestyle  . Physical activity:    Days per week: 0 days    Minutes per session: 0 min  . Stress: Not at all  Relationships  . Social connections:    Talks on phone: More than three times a week    Gets together: Once a week    Attends religious service: More than 4 times per year    Active member of club or organization: No    Attends meetings of clubs or organizations: Never    Relationship status: Married  . Intimate partner violence:    Fear of current or ex partner: No    Emotionally abused: No    Physically abused: No    Forced sexual activity: No  Other Topics Concern  . Not on file  Social History Narrative   Lives at home with husband.      Vitals:   10/05/17 1326  BP: 102/60  Pulse: 70  SpO2: 96%  Weight: 210 lb (95.3 kg)  Height: 5\' 2"  (1.575 m)    Wt Readings from Last 3 Encounters:  10/05/17 210 lb (95.3 kg)  10/01/17 209 lb 12.8 oz (95.2 kg)  09/22/17 209 lb 3.2 oz (94.9 kg)     PHYSICAL EXAM General: NAD HEENT: Normal. Neck: No JVD, no thyromegaly. Lungs: Clear to auscultation bilaterally with normal respiratory effort. CV: Regular rate and  irregular rhythm, normal S1/S2, no S3, no murmur. No pretibial or periankle edema.  No carotid bruit.   Abdomen: Soft, nontender, no distention, obese.  Neurologic: Alert and oriented.  Psych: Normal affect. Skin: Normal. Musculoskeletal: No gross deformities.    ECG: Most recent ECG reviewed.   Labs: Lab Results  Component Value Date/Time   K 3.9 08/10/2017 12:25 PM   BUN 19 08/10/2017 12:25 PM   BUN 26 05/27/2017 01:19 PM   CREATININE 1.10 (H) 08/10/2017 12:25 PM   ALT 16 05/28/2017 10:04 AM   TSH 1.922 05/19/2017 01:18 PM   TSH 3.550 01/04/2015 10:28 AM   HGB 14.4 08/10/2017 12:25 PM   HGB 14.2 05/27/2017 01:19 PM     Lipids: Lab Results  Component Value Date/Time   LDLCALC 71 01/04/2015 10:28 AM   LDLDIRECT 62 03/17/2016 04:14 PM   CHOL 163 01/04/2015 10:28 AM   TRIG 249 (H) 01/04/2015 10:28 AM   HDL 42 01/04/2015 10:28 AM       ASSESSMENT AND PLAN:  1. Permanent atrial fibrillation: Symptomatically stable.  Continue long acting diltiazem 120 mg daily and metoprolol 125 mg twice daily. Anticoagulated withapixaban.  2. Chronic diastolic heart failure:Euvolemic and stable. Currently takes Lasix 20 mg twice daily.   She still complains of frequent urination.  I told her she can take Lasix as needed for increased swelling, dyspnea, and weight gain.  3. Hypertension: Controlled. No changes.  4. Hyperlipidemia: Continue Lipitor 40 mg.  5.  Exertional dyspnea and fatigue: She underwent a low risk nuclear stress test in January 2017.  Heart rate with respect atrial fibrillation is controlled.  She has multiple  cardiovascular risk factors including insulin-dependent diabetes mellitus.  I will obtain a coronary CT with fractional flow reserve if necessary.   Disposition: Follow up 3 months   Kate Sable, M.D., F.A.C.C.

## 2017-10-06 DIAGNOSIS — M62830 Muscle spasm of back: Secondary | ICD-10-CM | POA: Diagnosis not present

## 2017-10-06 DIAGNOSIS — M9903 Segmental and somatic dysfunction of lumbar region: Secondary | ICD-10-CM | POA: Diagnosis not present

## 2017-10-06 DIAGNOSIS — M47816 Spondylosis without myelopathy or radiculopathy, lumbar region: Secondary | ICD-10-CM | POA: Diagnosis not present

## 2017-10-06 DIAGNOSIS — M545 Low back pain: Secondary | ICD-10-CM | POA: Diagnosis not present

## 2017-10-07 ENCOUNTER — Other Ambulatory Visit: Payer: Self-pay | Admitting: *Deleted

## 2017-10-07 DIAGNOSIS — I5032 Chronic diastolic (congestive) heart failure: Secondary | ICD-10-CM

## 2017-10-08 ENCOUNTER — Telehealth: Payer: Self-pay

## 2017-10-08 ENCOUNTER — Other Ambulatory Visit: Payer: Self-pay

## 2017-10-08 DIAGNOSIS — Z01818 Encounter for other preprocedural examination: Secondary | ICD-10-CM

## 2017-10-08 NOTE — Telephone Encounter (Signed)
Home phone line went to fax, LM on pt's cell that she needs BMET before coronary CT.Lab slip at front desk

## 2017-10-09 ENCOUNTER — Other Ambulatory Visit (HOSPITAL_COMMUNITY): Payer: Self-pay

## 2017-10-09 ENCOUNTER — Telehealth: Payer: Self-pay | Admitting: Family Medicine

## 2017-10-09 DIAGNOSIS — Z79899 Other long term (current) drug therapy: Secondary | ICD-10-CM | POA: Diagnosis not present

## 2017-10-09 DIAGNOSIS — I10 Essential (primary) hypertension: Secondary | ICD-10-CM | POA: Diagnosis not present

## 2017-10-09 DIAGNOSIS — N183 Chronic kidney disease, stage 3 (moderate): Secondary | ICD-10-CM | POA: Diagnosis not present

## 2017-10-09 DIAGNOSIS — E559 Vitamin D deficiency, unspecified: Secondary | ICD-10-CM | POA: Diagnosis not present

## 2017-10-09 DIAGNOSIS — R809 Proteinuria, unspecified: Secondary | ICD-10-CM | POA: Diagnosis not present

## 2017-10-09 DIAGNOSIS — D509 Iron deficiency anemia, unspecified: Secondary | ICD-10-CM | POA: Diagnosis not present

## 2017-10-09 MED ORDER — PREGABALIN 150 MG PO CAPS: 150.0000 mg | ORAL_CAPSULE | Freq: Two times a day (BID) | ORAL | 2 refills | Status: DC

## 2017-10-09 MED ORDER — TRAZODONE HCL 100 MG PO TABS: 300.0000 mg | ORAL_TABLET | Freq: Every day | ORAL | 2 refills | Status: DC

## 2017-10-09 NOTE — Telephone Encounter (Signed)
What type of referral do you need? urologist  Have you been seen at our office for this problem? yes (If no, schedule them an appointment.  They will need to be seen before a referral can be done.)  Is there a particular doctor or location that you prefer? no  Patient notified that referrals can take up to a week or longer to process. If they haven't heard anything within a week they should call back and speak with the referral department.

## 2017-10-09 NOTE — Telephone Encounter (Signed)
Patient aware that referral was place to [facility] on 10/01/17 and papers were faxed.

## 2017-10-12 ENCOUNTER — Ambulatory Visit: Payer: Medicare Other

## 2017-10-12 ENCOUNTER — Other Ambulatory Visit (HOSPITAL_COMMUNITY)
Admission: RE | Admit: 2017-10-12 | Discharge: 2017-10-12 | Disposition: A | Discharge status: Home / Self Care | Payer: Medicare Other | Source: Ambulatory Visit | Attending: Cardiovascular Disease | Admitting: Cardiovascular Disease

## 2017-10-12 ENCOUNTER — Ambulatory Visit (INDEPENDENT_AMBULATORY_CARE_PROVIDER_SITE_OTHER): Payer: Medicare Other

## 2017-10-12 DIAGNOSIS — Z01818 Encounter for other preprocedural examination: Secondary | ICD-10-CM | POA: Insufficient documentation

## 2017-10-12 DIAGNOSIS — Z780 Asymptomatic menopausal state: Secondary | ICD-10-CM

## 2017-10-12 DIAGNOSIS — M810 Age-related osteoporosis without current pathological fracture: Secondary | ICD-10-CM | POA: Diagnosis not present

## 2017-10-12 LAB — BASIC METABOLIC PANEL
Anion gap: 8 (ref 5–15)
BUN: 16 mg/dL (ref 6–20)
CO2: 25 mmol/L (ref 22–32)
Calcium: 9 mg/dL (ref 8.9–10.3)
Chloride: 105 mmol/L (ref 101–111)
Creatinine, Ser: 0.95 mg/dL (ref 0.44–1.00)
GFR calc Af Amer: >60 mL/min (ref >60)
GFR calc non Af Amer: 59 mL/min — ABNORMAL LOW (ref >60)
Glucose, Bld: 223 mg/dL — ABNORMAL HIGH (ref 65–99)
Potassium: 4.3 mmol/L (ref 3.5–5.1)
Sodium: 138 mmol/L (ref 135–145)

## 2017-10-14 ENCOUNTER — Other Ambulatory Visit: Payer: Self-pay | Admitting: Physician Assistant

## 2017-10-14 ENCOUNTER — Ambulatory Visit (HOSPITAL_COMMUNITY)
Admission: RE | Admit: 2017-10-14 | Discharge: 2017-10-14 | Disposition: A | Discharge status: Home / Self Care | Payer: Medicare Other | Source: Ambulatory Visit | Attending: Nephrology | Admitting: Nephrology

## 2017-10-14 ENCOUNTER — Other Ambulatory Visit (HOSPITAL_COMMUNITY): Payer: Self-pay | Admitting: Nephrology

## 2017-10-14 DIAGNOSIS — I5030 Unspecified diastolic (congestive) heart failure: Secondary | ICD-10-CM

## 2017-10-14 DIAGNOSIS — I509 Heart failure, unspecified: Secondary | ICD-10-CM | POA: Diagnosis not present

## 2017-10-14 DIAGNOSIS — R0602 Shortness of breath: Secondary | ICD-10-CM | POA: Diagnosis not present

## 2017-10-14 DIAGNOSIS — I517 Cardiomegaly: Secondary | ICD-10-CM | POA: Insufficient documentation

## 2017-10-14 DIAGNOSIS — R918 Other nonspecific abnormal finding of lung field: Secondary | ICD-10-CM | POA: Insufficient documentation

## 2017-10-16 ENCOUNTER — Emergency Department (HOSPITAL_COMMUNITY)
Admission: EM | Admit: 2017-10-16 | Discharge: 2017-10-16 | Disposition: A | Discharge status: Home / Self Care | Payer: Medicare Other | Attending: Emergency Medicine | Admitting: Emergency Medicine

## 2017-10-16 ENCOUNTER — Encounter (HOSPITAL_COMMUNITY): Payer: Self-pay | Admitting: Emergency Medicine

## 2017-10-16 ENCOUNTER — Other Ambulatory Visit: Payer: Self-pay

## 2017-10-16 ENCOUNTER — Emergency Department (HOSPITAL_COMMUNITY): Payer: Medicare Other

## 2017-10-16 DIAGNOSIS — I130 Hypertensive heart and chronic kidney disease with heart failure and stage 1 through stage 4 chronic kidney disease, or unspecified chronic kidney disease: Secondary | ICD-10-CM | POA: Insufficient documentation

## 2017-10-16 DIAGNOSIS — Z79899 Other long term (current) drug therapy: Secondary | ICD-10-CM | POA: Insufficient documentation

## 2017-10-16 DIAGNOSIS — Z7901 Long term (current) use of anticoagulants: Secondary | ICD-10-CM | POA: Diagnosis not present

## 2017-10-16 DIAGNOSIS — I5032 Chronic diastolic (congestive) heart failure: Secondary | ICD-10-CM | POA: Diagnosis not present

## 2017-10-16 DIAGNOSIS — R0602 Shortness of breath: Secondary | ICD-10-CM

## 2017-10-16 DIAGNOSIS — E1122 Type 2 diabetes mellitus with diabetic chronic kidney disease: Secondary | ICD-10-CM | POA: Insufficient documentation

## 2017-10-16 DIAGNOSIS — J9811 Atelectasis: Secondary | ICD-10-CM | POA: Diagnosis not present

## 2017-10-16 DIAGNOSIS — Z794 Long term (current) use of insulin: Secondary | ICD-10-CM | POA: Insufficient documentation

## 2017-10-16 DIAGNOSIS — R32 Unspecified urinary incontinence: Secondary | ICD-10-CM | POA: Diagnosis not present

## 2017-10-16 DIAGNOSIS — N183 Chronic kidney disease, stage 3 (moderate): Secondary | ICD-10-CM | POA: Diagnosis not present

## 2017-10-16 LAB — CARBAMAZEPINE LEVEL, TOTAL: Carbamazepine Lvl: 5.5 ug/mL (ref 4.0–12.0)

## 2017-10-16 LAB — CBC WITH DIFFERENTIAL/PLATELET
Basophils Absolute: 0 10*3/uL (ref 0.0–0.1)
Basophils Relative: 0 %
Eosinophils Absolute: 0.1 10*3/uL (ref 0.0–0.7)
Eosinophils Relative: 1 %
HCT: 39.8 % (ref 36.0–46.0)
Hemoglobin: 13.2 g/dL (ref 12.0–15.0)
Lymphocytes Relative: 28 %
Lymphs Abs: 2 10*3/uL (ref 0.7–4.0)
MCH: 29.6 pg (ref 26.0–34.0)
MCHC: 33.2 g/dL (ref 30.0–36.0)
MCV: 89.2 fL (ref 78.0–100.0)
Monocytes Absolute: 0.5 10*3/uL (ref 0.1–1.0)
Monocytes Relative: 7 %
Neutro Abs: 4.7 10*3/uL (ref 1.7–7.7)
Neutrophils Relative %: 64 %
Platelets: 208 10*3/uL (ref 150–400)
RBC: 4.46 MIL/uL (ref 3.87–5.11)
RDW: 13.1 % (ref 11.5–15.5)
WBC: 7.3 10*3/uL (ref 4.0–10.5)

## 2017-10-16 LAB — COMPREHENSIVE METABOLIC PANEL
ALT: 17 U/L (ref 14–54)
AST: 19 U/L (ref 15–41)
Albumin: 3.8 g/dL (ref 3.5–5.0)
Alkaline Phosphatase: 67 U/L (ref 38–126)
Anion gap: 8 (ref 5–15)
BUN: 19 mg/dL (ref 6–20)
CO2: 28 mmol/L (ref 22–32)
Calcium: 9.4 mg/dL (ref 8.9–10.3)
Chloride: 102 mmol/L (ref 101–111)
Creatinine, Ser: 0.9 mg/dL (ref 0.44–1.00)
GFR calc Af Amer: >60 mL/min (ref >60)
GFR calc non Af Amer: >60 mL/min (ref >60)
Glucose, Bld: 131 mg/dL — ABNORMAL HIGH (ref 65–99)
Potassium: 3.7 mmol/L (ref 3.5–5.1)
Sodium: 138 mmol/L (ref 135–145)
Total Bilirubin: 0.6 mg/dL (ref 0.3–1.2)
Total Protein: 6.8 g/dL (ref 6.5–8.1)

## 2017-10-16 LAB — URINALYSIS, ROUTINE W REFLEX MICROSCOPIC
Bilirubin Urine: NEGATIVE
Glucose, UA: NEGATIVE mg/dL
Hgb urine dipstick: NEGATIVE
Ketones, ur: NEGATIVE mg/dL
Leukocytes, UA: NEGATIVE
Nitrite: NEGATIVE
Protein, ur: NEGATIVE mg/dL
Specific Gravity, Urine: 1.008 (ref 1.005–1.030)
pH: 6 (ref 5.0–8.0)

## 2017-10-16 LAB — BRAIN NATRIURETIC PEPTIDE: B Natriuretic Peptide: 185 pg/mL — ABNORMAL HIGH (ref 0.0–100.0)

## 2017-10-16 LAB — TROPONIN I: Troponin I: <0.03 ng/mL (ref <0.03)

## 2017-10-16 NOTE — ED Notes (Signed)
Pt ambulated with sats maintaining 94%-97%.

## 2017-10-16 NOTE — Discharge Instructions (Addendum)
Today's work-up without evidence of any acute findings.  Heart marker was okay CT chest without evidence of fluid on the lungs or pneumonia.  Labs without significant abnormalities.  Urinalysis normal.  Continue current medications.  Follow-up with urology as arranged by your primary care doctor.  Make an appointment to follow-up with primary care doctor next week.  Also contact your cardiologist to make them aware you are still having the symptoms.  We do know that you have a CT coronary angios scheduled for June 6.  Return for any new or worse symptoms.

## 2017-10-16 NOTE — ED Triage Notes (Signed)
Increased sob since 10-05-17, pt appears in no distress.  Also reports being incontinent of urine at times x 7 days.

## 2017-10-16 NOTE — ED Provider Notes (Signed)
[facility] EMERGENCY DEPARTMENT Provider Note   CSN: [id number] Arrival date & time: 10/16/17  1423     History   Chief Complaint Chief Complaint  Patient presents with  . Shortness of Breath    HPI Amber Stephenson is a 72 y.o. female.  Patient with 2-week or longer history of shortness of breath and urinary incontinence.  Chart review shows the patient has been evaluated by her primary care doctor as well as her cardiologist for both these complaints.  Primary care doctor is made a referral to urology.  When had direct explanation for her symptoms.  Cardiology thought that the increase in Lasix probably was leading to the urinary incontinence.  Patient had chest x-ray done yesterday that showed no evidence of pulmonary edema.  Patient does not understand why she still feels short of breath.  Patient is not on oxygen at home.  Patient does have a history of diastolic congestive heart failure.  Patient is on Lasix.  Patient denies any chest pain.     Past Medical History:  Diagnosis Date  . Atrial fibrillation ([place])   . Bipolar affective ([place])   . CHF (congestive heart failure) ([place])   . Chronic back pain   . Diabetes mellitus without complication ([place])   . Hyperlipidemia   . Hypertension   . Pain management   . Renal insufficiency     Patient Active Problem List   Diagnosis Date Noted  . Atrial fibrillation with RVR ([place]) 05/19/2017  . Microscopic colitis 05/19/2017  . Hypotension 05/19/2017  . Vulvovaginal candidiasis 05/19/2017  . Fibromyalgia 03/19/2017  . Healthcare maintenance 02/12/2016  . Migraine headache with aura 02/12/2016  . Diabetic neuropathy ([place]) 02/06/2016  . Depression   . Herpes genitalis in women 07/16/2015  . Chronic anticoagulation 05/30/2015  . Family history of coronary artery disease in sister 05/30/2015  . Chronic diastolic CHF (congestive heart failure) ([place]) 05/30/2015  . Chest pain with moderate risk of acute coronary syndrome  05/29/2015  . Essential hypertension   . RLS (restless legs syndrome) 04/27/2015  . Lipoma 02/08/2015  . Bipolar disorder ([place]) 01/23/2015  . Insomnia 01/23/2015  . CKD stage 3 due to type 2 diabetes mellitus ([place]) 01/05/2015  . Chronic back pain 01/04/2015  . Permanent atrial fibrillation ([place]) 01/04/2015  . Diabetes mellitus with stage 2 chronic kidney disease ([place])   . Hyperlipidemia     Past Surgical History:  Procedure Laterality Date  . BREAST REDUCTION SURGERY    . EYE SURGERY Right    cateracts  . HAMMER TOE SURGERY    . SHOULDER SURGERY Right   . THIGH SURGERY       OB History   None      Home Medications    Prior to Admission medications   Medication Sig Start Date End Date Taking? Authorizing Provider  apixaban (ELIQUIS) 5 MG TABS tablet Take 5 mg by mouth 2 (two) times daily.    [provider]  ARIPiprazole (ABILIFY) 10 MG tablet TAKE 3 TABLETS EVERY MORNING Patient taking differently: Take 30 mg by mouth daily.  07/15/17   Plovsky, Berneta Sages, MD  atorvastatin (LIPITOR) 40 MG tablet TAKE 1 TABLET DAILY Patient taking differently: TAKE 1 TABLET (40 MG) BY MOUTH DAILY 07/27/17   Timmothy Euler, MD  Blood Glucose Monitoring Suppl (FREESTYLE LITE) DEVI Use to check blood sugar tid. DX E11.22 04/04/15   Timmothy Euler, MD  carbamazepine (CARBATROL) 100 MG 12 hr capsule Take 1 capsule (  100 mg total) by mouth 2 (two) times daily. 09/09/17 09/09/18  Plovsky, Berneta Sages, MD  Cholecalciferol (VITAMIN D3) 1000 UNITS CAPS Take 1,000 Units by mouth daily.    [provider]  clotrimazole (LOTRIMIN) 1 % cream APPLY EXTERNALLY TO THE AFFECTED AREA TWICE DAILY 06/18/17   Timmothy Euler, MD  diltiazem (CARDIZEM CD) 120 MG 24 hr capsule Take 1 capsule (120 mg total) by mouth daily. Patient taking differently: Take 120 mg by mouth at bedtime.  06/26/17   Herminio Commons, MD  Dulaglutide (TRULICITY) 1.5 IO/2.7OJ SOPN Inject 1.5 mg into the skin once a week.  08/20/17   Timmothy Euler, MD  fenofibrate (TRICOR) 145 MG tablet Take 1 tablet (145 mg total) by mouth daily. 09/10/17   Timmothy Euler, MD  FREESTYLE LITE test strip USE FOR TESTING SUGAR THREE TIMES DAILY 01/19/17   Timmothy Euler, MD  furosemide (LASIX) 20 MG tablet Take 1 tablet (20 mg total) by mouth 2 (two) times daily as needed. 10/05/17   Herminio Commons, MD  Insulin Glargine (LANTUS SOLOSTAR) 100 UNIT/ML Solostar Pen INJECT 50 UNITS UNDER THE SKIN TWICE A DAY 09/21/17   Timmothy Euler, MD  Insulin Pen Needle (SURE COMFORT PEN NEEDLES) 32G X 4 MM MISC Use with insulin pens as directed. Dx E11.9 08/16/15   Timmothy Euler, MD  Ipratropium-Albuterol (COMBIVENT RESPIMAT) 20-100 MCG/ACT AERS respimat Inhale 1 puff into the lungs every 6 (six) hours. Patient taking differently: Inhale 1 puff into the lungs every 6 (six) hours as needed for wheezing or shortness of breath.  07/22/17   Timmothy Euler, MD  ipratropium-albuterol (DUONEB) 0.5-2.5 (3) MG/3ML SOLN Take 3 mLs by nebulization every 4 (four) hours as needed (shortness of breath/ wheezing).     [provider]  Lancets (FREESTYLE) lancets Test tid. DX E11.22 08/12/17   Timmothy Euler, MD  levalbuterol Penne Lash) 1.25 MG/3ML nebulizer solution Take 1.25 mg by nebulization every 4 (four) hours as needed for wheezing. 07/18/16   Timmothy Euler, MD  lidocaine (LIDODERM) 5 % Place 1 patch onto the skin daily. Remove & Discard patch within 12 hours or as directed by MD Patient taking differently: Place 1 patch onto the skin daily as needed (pain). Remove & Discard patch within 12 hours or as directed by MD 08/06/17   Timmothy Euler, MD  lidocaine (XYLOCAINE) 2 % solution SWISH AND SPIT ENOUGH TO FILL MOUTH DAILY AS NEEDED FOR MOUTH PAIN 10/15/17   Timmothy Euler, MD  LORazepam (ATIVAN) 1 MG tablet Take 1 tab (1 mg) in the morning and 2 tabs (2 mg ) at bedtime 09/29/17   Plovsky, Berneta Sages, MD  metoprolol tartrate  (LOPRESSOR) 100 MG tablet TAKE 1 TABLET TWICE A DAY Patient taking differently: TAKE 1 TABLET (100 MG) BY MOUTH TWICE DAILY WITH  A 25 MG TABLET FOR A TOTAL DOSE OF 125 MG 05/01/17   Herminio Commons, MD  metoprolol tartrate (LOPRESSOR) 25 MG tablet TAKE 1 TABLET TWICE A DAY (TO BE TAKEN ALONG WITH THE LOPRESSOR 100 MG TWICE A DAY) Patient taking differently: TAKE 1 TABLET (25 MG) BY MOUTH TWICE DAILY WITH A 100 MG TABLET FOR A TOTAL DOSE OF 125 MG 11/18/16   Herminio Commons, MD  naloxone [facility]) nasal spray 4 mg/0.1 mL Place 1 spray into the nose once as needed (opiod overdose).    [provider]  oxyCODONE-acetaminophen (PERCOCET/ROXICET) 5-325 MG tablet Take 1 tablet by  mouth every 8 (eight) hours. 08/07/17   [provider]  predniSONE (DELTASONE) 50 MG tablet Prednisone 50 mg - take 13 hours prior to test Take another Prednisone 50 mg 7 hours prior to test Take another Prednisone 50 mg  1 hour prior to test 10/05/17   Herminio Commons, MD  pregabalin (LYRICA) 150 MG capsule Take 1 capsule (150 mg total) by mouth 2 (two) times daily. 10/09/17 10/09/18  Plovsky, Berneta Sages, MD  rizatriptan (MAXALT-MLT) 10 MG disintegrating tablet DISSOLVE 1 TABLET UNDER THE TONGUE IMMEDIATELY AT SIGN OF HEADACHE. MAY REPEAT DOSE IN 1 HOUR IF NEEDED. MAX OF 3 TABLETS IN 24 HOURS 09/11/17   Timmothy Euler, MD  SUMAtriptan (IMITREX) 100 MG tablet TAKE 1 TABLET BY MOUTH AT ONSET OF HEADACHE. MAY REPEAT IN 2 HOURS IF HEADACHE PERSISTS OR RECURS.LIMIT 2 PER 24 HOURS Patient taking differently: TAKE 1 TABLET (100 MG) BY MOUTH AT ONSET OF HEADACHE. MAY REPEAT IN 2 HOURS IF HEADACHE PERSISTS OR RECURS.LIMIT 2 PER 24 HOURS 02/06/17   Timmothy Euler, MD  tizanidine (ZANAFLEX) 6 MG capsule TAKE 1 CAPSULE THREE TIMES A DAY AS NEEDED FOR MUSCLE SPASMS Patient taking differently: Take 6 mg by mouth 3 (three) times daily as needed for muscle spasms.  03/19/17   Timmothy Euler, MD  traZODone (DESYREL)  100 MG tablet Take 3 tablets (300 mg total) by mouth at bedtime. 10/09/17   Norma Fredrickson, MD    Family History Family History  Problem Relation Age of Onset  . Diabetes Mother   . Heart disease Mother   . Heart disease Brother   . Heart disease Sister        CABG  . Alcohol abuse Sister   . Mental illness Brother   . Diabetes Brother     Social History Social History   Tobacco Use  . Smoking status: Never Smoker  . Smokeless tobacco: Never Used  Substance Use Topics  . Alcohol use: No    Alcohol/week: 0.0 oz  . Drug use: No     Allergies   Ivp dye [iodinated diagnostic agents]   Review of Systems Review of Systems  Constitutional: Negative for fever.  HENT: Negative for congestion.   Eyes: Negative for redness.  Respiratory: Positive for shortness of breath.   Cardiovascular: Negative for chest pain, palpitations and leg swelling.  Gastrointestinal: Negative for abdominal pain.  Genitourinary: Negative for dysuria and hematuria.  Musculoskeletal: Negative for back pain.  Skin: Negative for rash.  Neurological: Negative for syncope.  Hematological: Bruises/bleeds easily.  Psychiatric/Behavioral: Negative for confusion.     Physical Exam Updated Vital Signs BP (!) 102/45 (BP Location: Left Arm)   Pulse 77   Temp 98.5 F (36.9 C)   Resp 18   Ht 1.575 m (5\' 2" )   Wt 90.7 kg (200 lb)   SpO2 95%   BMI 36.58 kg/m   Physical Exam  Constitutional: She is oriented to person, place, and time. She appears well-developed and well-nourished. No distress.  HENT:  Head: Normocephalic and atraumatic.  Mouth/Throat: Oropharynx is clear and moist.  Eyes: Pupils are equal, round, and reactive to light. EOM are normal.  Neck: Neck supple.  Cardiovascular: Normal rate and regular rhythm.  Pulmonary/Chest: Effort normal and breath sounds normal.  Abdominal: Soft. Bowel sounds are normal. There is no tenderness.  Musculoskeletal: Normal range of motion. She exhibits  no edema.  Neurological: She is alert and oriented to person, place, and time. No  cranial nerve deficit or sensory deficit. She exhibits normal muscle tone. Coordination normal.  Skin: Skin is warm.  Nursing note and vitals reviewed.    ED Treatments / Results  Labs (all labs ordered are listed, but only abnormal results are displayed) Labs Reviewed  COMPREHENSIVE METABOLIC PANEL - Abnormal; Notable for the following components:      Result Value   Glucose, Bld 131 (*)    All other components within normal limits  BRAIN NATRIURETIC PEPTIDE - Abnormal; Notable for the following components:   B Natriuretic Peptide 185.0 (*)    All other components within normal limits  URINE CULTURE  URINALYSIS, ROUTINE W REFLEX MICROSCOPIC  CBC WITH DIFFERENTIAL/PLATELET  TROPONIN I  CARBAMAZEPINE LEVEL, TOTAL    EKG EKG Interpretation  Date/Time:  Friday Oct 16 2017 14:46:17 EDT Ventricular Rate:  64 PR Interval:    QRS Duration: 73 QT Interval:  434 QTC Calculation: 448 R Axis:   -24 Text Interpretation:  Atrial fibrillation Borderline left axis deviation Abnormal R-wave progression, early transition Repol abnrm suggests ischemia, lateral leads Confirmed by Fredia Sorrow [phone]) on 10/16/2017 3:18:23 PM   Radiology Ct Chest Wo Contrast  Result Date: 10/16/2017 CLINICAL DATA:  Increased shortness of breath this 10/05/2017. EXAM: CT CHEST WITHOUT CONTRAST TECHNIQUE: Multidetector CT imaging of the chest was performed following the standard protocol without IV contrast. COMPARISON:  Chest radiograph, 10/16/2017. FINDINGS: Cardiovascular: Mild enlargement of the heart. Dense mitral valve annular calcifications. Mild proximal right and left coronary artery calcifications. No pericardial effusion. The great vessels are normal in caliber. Mild atherosclerotic calcifications noted along the aortic arch and at the origin of the left subclavian artery. Mediastinum/Nodes: No enlarged mediastinal or  axillary lymph nodes. Thyroid gland, trachea, and esophagus demonstrate no significant findings. Lungs/Pleura: No evidence of pneumonia or pulmonary edema. There are several small dense nodules consistent with healed granuloma. There no suspicious nodules. Mild subsegmental atelectasis is noted most evident left upper lobe lingula adjacent to the left heart border. Mild peripheral scarring in the upper lobes at and near the apices. No pleural effusion or pneumothorax. Upper Abdomen: No acute findings.  Fatty infiltration of the liver. Musculoskeletal: Mild depression of the upper endplate T11 which appears chronic and is associated with a Schmorl's node. Deformity of the right humeral head and neck consistent with an old fracture. No acute fracture. No osteoblastic or osteolytic lesions. IMPRESSION: 1. No acute findings.  No evidence of pneumonia or pulmonary edema. 2. Mild cardiomegaly. Coronary artery calcifications. Mild aortic atherosclerosis. 3. Mild areas of scarring and dependent atelectasis in the lungs. 4. Hepatic steatosis Aortic Atherosclerosis (ICD10-I70.0). Electronically Signed   By: Lajean Manes M.D.   On: 10/16/2017 16:29    Procedures Procedures (including critical care time)  Medications Ordered in ED Medications - No data to display   Initial Impression / Assessment and Plan / ED Course  I have reviewed the triage vital signs and the nursing notes.  Pertinent labs & imaging results that were available during my care of the patient were reviewed by me and considered in my medical decision making (see chart for details).     Patient with extensive work-up here today for the complaints of the shortness of breath and the urinary incontinence.  Chart review showed that patient primary care doctor and cardiologist were aware of both of these complaints.  Primary care doctor may referral to urology.  Cardiologist thought that may be the incontinence was secondary to increase in Lasix.  Chest x-ray done just yesterday showed no signs of pulmonary edema or congestive heart failure.  Patient's cause of shortness of breath was unknown.  Patient saturations here have been in the low 90s.  Patient ambulated did not desat below 90% on room air.  Patient by record shows that she is been having the symptoms since the beginning of May and probably the end of April.  Lab work-up here negative to include troponin BNP not significantly elevated.  CT chest negative for CAD congestive heart failure or pulmonary edema.  Urinalysis here without evidence for urinary tract infection.  Patient without any neurological deficits or lower extremities.  No saddle anesthesia.  Patient's Tegretol level was also therapeutic.  Patient stable for discharge home follow-up with cardiology and primary care provider.  Cardiology has a CT cardiac angios scheduled for June 6.  Patient will return for any new or worse symptoms.   Patient without any edema in her lower extremities.  Clinically do not feel this is consistent with pulmonary embolus.  Particularly based on the duration of the symptoms.  However not completely ruled out.  Patient not tachycardic. Final Clinical Impressions(s) / ED Diagnoses   Final diagnoses:  SOB (shortness of breath)  Urinary incontinence, unspecified type    ED Discharge Orders    None       Fredia Sorrow, MD 10/16/17 [phone]

## 2017-10-18 LAB — URINE CULTURE: Culture: NO GROWTH

## 2017-10-20 ENCOUNTER — Telehealth: Payer: Self-pay | Admitting: Family Medicine

## 2017-10-20 ENCOUNTER — Encounter: Payer: Self-pay | Admitting: Family Medicine

## 2017-10-20 ENCOUNTER — Ambulatory Visit (INDEPENDENT_AMBULATORY_CARE_PROVIDER_SITE_OTHER): Payer: Medicare Other | Admitting: Family Medicine

## 2017-10-20 VITALS — BP 138/81 | HR 70 | Temp 97.0°F | Ht 62.0 in | Wt 209.8 lb

## 2017-10-20 DIAGNOSIS — R0600 Dyspnea, unspecified: Secondary | ICD-10-CM | POA: Diagnosis not present

## 2017-10-20 DIAGNOSIS — M810 Age-related osteoporosis without current pathological fracture: Secondary | ICD-10-CM | POA: Diagnosis not present

## 2017-10-20 DIAGNOSIS — M47816 Spondylosis without myelopathy or radiculopathy, lumbar region: Secondary | ICD-10-CM | POA: Diagnosis not present

## 2017-10-20 DIAGNOSIS — M62830 Muscle spasm of back: Secondary | ICD-10-CM | POA: Diagnosis not present

## 2017-10-20 DIAGNOSIS — M9903 Segmental and somatic dysfunction of lumbar region: Secondary | ICD-10-CM | POA: Diagnosis not present

## 2017-10-20 DIAGNOSIS — M545 Low back pain: Secondary | ICD-10-CM | POA: Diagnosis not present

## 2017-10-20 MED ORDER — ALENDRONATE SODIUM 70 MG PO TABS: 70.0000 mg | ORAL_TABLET | ORAL | 3 refills | Status: DC

## 2017-10-20 MED ORDER — IPRATROPIUM-ALBUTEROL 20-100 MCG/ACT IN AERS: 1.0000 | INHALATION_SPRAY | Freq: Four times a day (QID) | RESPIRATORY_TRACT | 2 refills | Status: DC

## 2017-10-20 NOTE — Telephone Encounter (Signed)
Patient aware it is at the end of her after visit summery on how to take inhalers.

## 2017-10-20 NOTE — Progress Notes (Signed)
   HPI  Patient presents today here for ER follow-up.  Patient was seen in the emergency room 4 days ago for shortness of breath and continued urinary incontinence.  She has urology appointment to help with urinary continence. She is been checked for UTI which was negative.  Shortness of breath Seems to be going on for 2 months or so.  She feels it may be related to the pollen. She has old prescriptions of Xopenex and Combivent which do help. She denies any history of COPD, smoking, or diagnosis of asthma.  She has been diuresing well with Lasix   PMH: Smoking status noted ROS: Per HPI  Objective: BP 138/81   Pulse 70   Temp (!) 97 F (36.1 C) (Oral)   Ht 5\' 2"  (1.575 m)   Wt 209 lb 12.8 oz (95.2 kg)   SpO2 93%   BMI 38.37 kg/m  Gen: NAD, alert, cooperative with exam HEENT: NCAT CV: RRR, good S1/S2, no murmur Resp: CTABL, no wheezes, non-labored Ext: No edema, warm Neuro: Alert and oriented, No gross deficits  Assessment and plan:  #Dyspnea I suspect undiagnosed asthma with mild exacerbation Starting Symbicort samples today trying to avoid systemic steroids for now. She may continue use Combivent as rescue Refer to pulmonology for full eval when she is well  #Osteoporosis New diagnosis Discussed Prolia versus Fosamax, she would like to try bisphosphonates for now. She eats 1 serving of dairy most days, this is approximately 300 mg of elemental calcium Recommended continuing vitamin D and adding 800 mg of elemental calcium daily with 2 tums twice daily.    Orders Placed This Encounter  Procedures  . Ambulatory referral to Pulmonology    Referral Priority:   Routine    Referral Type:   Consultation    Referral Reason:   Specialty Services Required    Referred to Provider:   Juanito Doom, MD    Requested Specialty:   Pulmonary Disease    Number of Visits Requested:   1    Meds ordered this encounter  Medications  . alendronate (FOSAMAX) 70 MG  tablet    Sig: Take 1 tablet (70 mg total) by mouth every 7 (seven) days. Take with a full glass of water on an empty stomach.    Dispense:  12 tablet    Refill:  3  . Ipratropium-Albuterol (COMBIVENT RESPIMAT) 20-100 MCG/ACT AERS respimat    Sig: Inhale 1 puff into the lungs every 6 (six) hours.    Dispense:  4 g    Refill:  Whitney, MD [place] 10/20/2017, 10:03 AM

## 2017-10-20 NOTE — Patient Instructions (Addendum)
Great to see you!  Start symbicort 2 puffs twice daily, rinse out your mouth after you use it.   Use combivent up to 4 times daily as needed  You will hear from [place] for a referral to pulmonology    Start fosomax 1 pill once weekly Take 2 tums twice daily ( tums 500 or generic equivalent)  Increase vitamin D 3 to 200 IU daily

## 2017-10-22 DIAGNOSIS — M9903 Segmental and somatic dysfunction of lumbar region: Secondary | ICD-10-CM | POA: Diagnosis not present

## 2017-10-22 DIAGNOSIS — M47816 Spondylosis without myelopathy or radiculopathy, lumbar region: Secondary | ICD-10-CM | POA: Diagnosis not present

## 2017-10-22 DIAGNOSIS — M62830 Muscle spasm of back: Secondary | ICD-10-CM | POA: Diagnosis not present

## 2017-10-22 DIAGNOSIS — M545 Low back pain: Secondary | ICD-10-CM | POA: Diagnosis not present

## 2017-10-23 DIAGNOSIS — Z79899 Other long term (current) drug therapy: Secondary | ICD-10-CM | POA: Diagnosis not present

## 2017-10-23 DIAGNOSIS — M5136 Other intervertebral disc degeneration, lumbar region: Secondary | ICD-10-CM | POA: Diagnosis not present

## 2017-10-26 ENCOUNTER — Other Ambulatory Visit: Payer: Self-pay | Admitting: Cardiovascular Disease

## 2017-10-27 ENCOUNTER — Other Ambulatory Visit: Payer: Self-pay | Admitting: Family Medicine

## 2017-10-29 ENCOUNTER — Other Ambulatory Visit: Payer: Self-pay

## 2017-10-29 MED ORDER — DILTIAZEM HCL ER COATED BEADS 120 MG PO CP24: 120.0000 mg | ORAL_CAPSULE | Freq: Every day | ORAL | 3 refills | Status: DC

## 2017-10-29 NOTE — Telephone Encounter (Signed)
[facility]em [facility]

## 2017-10-30 ENCOUNTER — Ambulatory Visit (HOSPITAL_COMMUNITY): Payer: Self-pay | Admitting: Psychiatry

## 2017-11-03 ENCOUNTER — Ambulatory Visit (HOSPITAL_COMMUNITY): Payer: Medicare Other | Attending: Cardiovascular Disease

## 2017-11-03 ENCOUNTER — Ambulatory Visit (HOSPITAL_COMMUNITY): Admission: RE | Admit: 2017-11-03 | Payer: Medicare Other | Source: Ambulatory Visit

## 2017-11-05 DIAGNOSIS — Z794 Long term (current) use of insulin: Secondary | ICD-10-CM | POA: Diagnosis not present

## 2017-11-05 DIAGNOSIS — E1165 Type 2 diabetes mellitus with hyperglycemia: Secondary | ICD-10-CM | POA: Diagnosis not present

## 2017-11-05 DIAGNOSIS — E1140 Type 2 diabetes mellitus with diabetic neuropathy, unspecified: Secondary | ICD-10-CM | POA: Diagnosis not present

## 2017-11-06 ENCOUNTER — Ambulatory Visit (INDEPENDENT_AMBULATORY_CARE_PROVIDER_SITE_OTHER): Payer: Medicare Other | Admitting: Psychiatry

## 2017-11-06 ENCOUNTER — Encounter (HOSPITAL_COMMUNITY): Payer: Self-pay | Admitting: Psychiatry

## 2017-11-06 VITALS — BP 118/70 | HR 80 | Ht 62.0 in | Wt 209.8 lb

## 2017-11-06 DIAGNOSIS — Z818 Family history of other mental and behavioral disorders: Secondary | ICD-10-CM

## 2017-11-06 DIAGNOSIS — Z79891 Long term (current) use of opiate analgesic: Secondary | ICD-10-CM | POA: Diagnosis not present

## 2017-11-06 DIAGNOSIS — F3160 Bipolar disorder, current episode mixed, unspecified: Secondary | ICD-10-CM

## 2017-11-06 DIAGNOSIS — F419 Anxiety disorder, unspecified: Secondary | ICD-10-CM | POA: Diagnosis not present

## 2017-11-06 DIAGNOSIS — Z79899 Other long term (current) drug therapy: Secondary | ICD-10-CM | POA: Diagnosis not present

## 2017-11-06 DIAGNOSIS — Z811 Family history of alcohol abuse and dependence: Secondary | ICD-10-CM

## 2017-11-06 MED ORDER — CARBAMAZEPINE ER 100 MG PO CP12: 100.0000 mg | ORAL_CAPSULE | Freq: Two times a day (BID) | ORAL | 6 refills | Status: DC

## 2017-11-06 NOTE — Progress Notes (Signed)
Patient ID: Amber Stephenson, female   DOB: 1945/07/02, 72 y.o.   MRN: [id number] [facility] MD Progress Note .she's going to apply 11/06/2017 10:46 AM Amber Stephenson  MRN:  [id number] Subjective:  Doing well Principal Problem: Bipolar disorder most recent episode manic Diagnosis:  Bipolar disorder most recent   Today the patient says she feels more anxious. When asked what she feels anxious about she is unable to describe specific psychosocial stressor. She says is not related to her pain medicine or her pain doctor. Initially was the issue that they would let her take opiates with benzodiazepines I was giving her.The patient says now the pain doctors were excepting doesn't seem to be making an issue. The patient says that she's takingbreathing treatments and she says since starting is becoming more and more anxious.It is evident to me and to her that it's these breathing treatments and her making her more anxious. She is no specific stressor. Today the patient asked that she did increase her AtivanI sent not to. She takes 1 mg in the morning and 2 mg at night. Today once again clarify her Tegretol dose.It is 100 mg twice a day. Patient will continue taking high-dose Abilify 30 mg and trazodone 300 mg. Noted is that I began her on Lyrica 150 mg twice a day. The patient says it's very helpful. She says it helps for thetingling and strange feelings in her hands For now we'll continue it but I would rather her medical doctors prescribed. On the other hand there has been demonstrated to affected for bipolar disorder so is not inconsistent with my treatment. Nonetheless the combination of Tegretol Abilify high-dose Ativan at all oral beneficial for this patient. Past Medical History:  Past Medical History:  Diagnosis Date  . Atrial fibrillation ([place])   . Bipolar affective ([place])   . CHF (congestive heart failure) ([place])   . Chronic back pain   . Diabetes mellitus without complication ([place])   . Hyperlipidemia   .  Hypertension   . Pain management   . Renal insufficiency     Past Surgical History:  Procedure Laterality Date  . BREAST REDUCTION SURGERY    . EYE SURGERY Right    cateracts  . HAMMER TOE SURGERY    . SHOULDER SURGERY Right   . THIGH SURGERY     Family History:  Family History  Problem Relation Age of Onset  . Diabetes Mother   . Heart disease Mother   . Heart disease Brother   . Heart disease Sister        CABG  . Alcohol abuse Sister   . Mental illness Brother   . Diabetes Brother    Family Psychiatric  History:  Social History:  Social History   Substance and Sexual Activity  Alcohol Use No  . Alcohol/week: 0.0 oz     Social History   Substance and Sexual Activity  Drug Use No    Social History   Socioeconomic History  . Marital status: Married    Spouse name: Not on file  . Number of children: 3  . Years of education: 16  . Highest education level: Bachelor's degree (e.g., BA, AB, BS)  Occupational History  . Not on file  Social Needs  . Financial resource strain: Not hard at all  . Food insecurity:    Worry: Never true    Inability: Never true  . Transportation needs:    Medical: No    Non-medical: No  Tobacco Use  .  Smoking status: Never Smoker  . Smokeless tobacco: Never Used  Substance and Sexual Activity  . Alcohol use: No    Alcohol/week: 0.0 oz  . Drug use: No  . Sexual activity: Yes    Partners: Male    Birth control/protection: Post-menopausal  Lifestyle  . Physical activity:    Days per week: 0 days    Minutes per session: 0 min  . Stress: Not at all  Relationships  . Social connections:    Talks on phone: More than three times a week    Gets together: Once a week    Attends religious service: More than 4 times per year    Active member of club or organization: No    Attends meetings of clubs or organizations: Never    Relationship status: Married  Other Topics Concern  . Not on file  Social History Narrative   Lives at  home with husband.    Additional Social History:                         Sleep: Good  Appetite:  Good  Current Medications: Current Outpatient Medications  Medication Sig Dispense Refill  . alendronate (FOSAMAX) 70 MG tablet Take 1 tablet (70 mg total) by mouth every 7 (seven) days. Take with a full glass of water on an empty stomach. 12 tablet 3  . apixaban (ELIQUIS) 5 MG TABS tablet Take 5 mg by mouth 2 (two) times daily.    . ARIPiprazole (ABILIFY) 10 MG tablet TAKE 3 TABLETS EVERY MORNING (Patient taking differently: Take 30 mg by mouth daily. ) 270 tablet 2  . atorvastatin (LIPITOR) 40 MG tablet TAKE 1 TABLET DAILY (Patient taking differently: TAKE 1 TABLET (40 MG) BY MOUTH DAILY) 90 tablet 1  . Blood Glucose Monitoring Suppl (FREESTYLE LITE) DEVI Use to check blood sugar tid. DX E11.22 1 each 0  . carbamazepine (CARBATROL) 100 MG 12 hr capsule Take 1 capsule (100 mg total) by mouth 2 (two) times daily. 180 capsule 6  . Cholecalciferol (VITAMIN D3) 1000 UNITS CAPS Take 1,000 Units by mouth daily.    . clotrimazole (LOTRIMIN) 1 % cream APPLY EXTERNALLY TO THE AFFECTED AREA TWICE DAILY 30 g 2  . diltiazem (CARDIZEM CD) 120 MG 24 hr capsule Take 1 capsule (120 mg total) by mouth at bedtime. 90 capsule 3  . Dulaglutide (TRULICITY) 1.5 TK/2.4OX SOPN Inject 1.5 mg into the skin once a week. 12 pen 3  . fenofibrate (TRICOR) 145 MG tablet Take 1 tablet (145 mg total) by mouth daily. 90 tablet 0  . FREESTYLE LITE test strip USE FOR TESTING SUGAR THREE TIMES DAILY 100 each 3  . furosemide (LASIX) 20 MG tablet Take 1 tablet (20 mg total) by mouth 2 (two) times daily as needed. 180 tablet 1  . Insulin Glargine (LANTUS SOLOSTAR) 100 UNIT/ML Solostar Pen INJECT 50 UNITS UNDER THE SKIN TWICE A DAY 90 mL 0  . Insulin Pen Needle (SURE COMFORT PEN NEEDLES) 32G X 4 MM MISC Use with insulin pens as directed. Dx E11.9 300 each 11  . Ipratropium-Albuterol (COMBIVENT RESPIMAT) 20-100 MCG/ACT AERS  respimat Inhale 1 puff into the lungs every 6 (six) hours. 4 g 2  . Lancets (FREESTYLE) lancets Test tid. DX E11.22 100 each 5  . lidocaine (LIDODERM) 5 % Place 1 patch onto the skin daily. Remove & Discard patch within 12 hours or as directed by MD (Patient taking differently: Place 1  patch onto the skin daily as needed (pain). Remove & Discard patch within 12 hours or as directed by MD) 30 patch 2  . lidocaine (XYLOCAINE) 2 % solution SWISH AND SPIT ENOUGH TO FILL MOUTH DAILY AS NEEDED FOR MOUTH PAIN 100 mL 0  . LORazepam (ATIVAN) 1 MG tablet Take 1 tab (1 mg) in the morning and 2 tabs (2 mg ) at bedtime 90 tablet 2  . metoprolol tartrate (LOPRESSOR) 100 MG tablet TAKE 1 TABLET TWICE A DAY (Patient taking differently: TAKE 1 TABLET (100 MG) BY MOUTH TWICE DAILY WITH  A 25 MG TABLET FOR A TOTAL DOSE OF 125 MG) 180 tablet 3  . metoprolol tartrate (LOPRESSOR) 25 MG tablet TAKE 1 TABLET TWICE A DAY (TO BE TAKEN ALONG WITH THE LOPRESSOR 100 MG TWICE A DAY) 180 tablet 0  . naloxone (NARCAN) nasal spray 4 mg/0.1 mL Place 1 spray into the nose once as needed (opiod overdose).    Marland Kitchen oxyCODONE-acetaminophen (PERCOCET/ROXICET) 5-325 MG tablet Take 1 tablet by mouth every 8 (eight) hours.    . pregabalin (LYRICA) 150 MG capsule Take 1 capsule (150 mg total) by mouth 2 (two) times daily. 180 capsule 2  . rizatriptan (MAXALT-MLT) 10 MG disintegrating tablet DISSOLVE 1 TABLET UNDER THE TONGUE IMMEDIATELY AT SIGN OF HEADACHE. MAY REPEAT DOSE IN 1 HOUR IF NEEDED. MAX OF 3 TABLETS IN 24 HOURS 10 tablet 3  . SUMAtriptan (IMITREX) 100 MG tablet TAKE 1 TABLET BY MOUTH AT ONSET OF HEADACHE. MAY REPEAT IN 2 HOURS IF HEADACHE PERSISTS OR RECURS.LIMIT 2 PER 24 HOURS 10 tablet 2  . tizanidine (ZANAFLEX) 6 MG capsule TAKE 1 CAPSULE THREE TIMES A DAY AS NEEDED FOR MUSCLE SPASMS (Patient taking differently: Take 6 mg by mouth 3 (three) times daily as needed for muscle spasms. ) 90 capsule 3  . traZODone (DESYREL) 100 MG tablet  Take 3 tablets (300 mg total) by mouth at bedtime. 90 tablet 2  . predniSONE (DELTASONE) 50 MG tablet Prednisone 50 mg - take 13 hours prior to test Take another Prednisone 50 mg 7 hours prior to test Take another Prednisone 50 mg  1 hour prior to test (Patient not taking: Reported on 11/06/2017) 3 tablet 0   No current facility-administered medications for this visit.     Lab Results: No results found for this or any previous visit (from the past 48 hour(s)).  Blood Alcohol level:  Lab Results  Component Value Date   ETH <5 10/01/2015    Physical Findings: AIMS:  , ,  ,  ,    CIWA:    COWS:     Musculoskeletal: Strength & Muscle Tone: within normal limits Gait & Station: normal Patient leans: N/A  Psychiatric Specialty Exam: ROS  Blood pressure 118/70, pulse 80, height 5\' 2"  (1.575 m), weight 209 lb 12.8 oz (95.2 kg), SpO2 98 %.Body mass index is 38.37 kg/m.  General Appearance: Casual  Eye Contact::  Good  Speech:  Clear and Coherent  Volume:  Normal  Mood:  Euthymic  Affect:  Appropriate  Thought Process:  Coherent  Orientation:  Full (Time, Place, and Person)  Thought Content:  WDL  Suicidal Thoughts:  No  Homicidal Thoughts:  No  Memory:  NA  Judgement:  Good  Insight:  Good  Psychomotor Activity:  Normal  Concentration:  Good  Recall:  Good  Fund of Knowledge:Good  Language: Good  Akathisia:  No  Handed:  Right  AIMS (if indicated):  Assets: Good spirits   ADL's:  Intact  Cognition: WNL  Sleep:     eks Treatment Plan Summary:  Today the patient is seen with her husband. Other than some complaints of anxiety which I think is directly related to her breathing treatmentsshe is actually doing well. She denies any symptoms of mania. Generally she sleeping and eating well has good energy has no racing thoughts and seems to be very stable. She medically is not completely well that she's active treatment. Her pain level is actually well controlled on the  opiates she's receiving. At this time we will change no medications. She'll continue taking Ativan 1 mg one in the morning and 2 at night her Abilify trazodone and Tegretol and clarify dose 100 mg twice a day. This patient she'll return to see me in 3 months repeat 30 minute session. Today was only a 15 minute visit. This is due to scheduling issues.

## 2017-11-10 DIAGNOSIS — M545 Low back pain: Secondary | ICD-10-CM | POA: Diagnosis not present

## 2017-11-10 DIAGNOSIS — M62830 Muscle spasm of back: Secondary | ICD-10-CM | POA: Diagnosis not present

## 2017-11-10 DIAGNOSIS — M47816 Spondylosis without myelopathy or radiculopathy, lumbar region: Secondary | ICD-10-CM | POA: Diagnosis not present

## 2017-11-10 DIAGNOSIS — M9903 Segmental and somatic dysfunction of lumbar region: Secondary | ICD-10-CM | POA: Diagnosis not present

## 2017-11-11 ENCOUNTER — Ambulatory Visit: Payer: Medicare Other | Admitting: Family Medicine

## 2017-11-12 ENCOUNTER — Ambulatory Visit (INDEPENDENT_AMBULATORY_CARE_PROVIDER_SITE_OTHER): Payer: Medicare Other | Admitting: Family Medicine

## 2017-11-12 ENCOUNTER — Encounter: Payer: Self-pay | Admitting: Family Medicine

## 2017-11-12 DIAGNOSIS — Z23 Encounter for immunization: Secondary | ICD-10-CM | POA: Diagnosis not present

## 2017-11-12 DIAGNOSIS — M79672 Pain in left foot: Secondary | ICD-10-CM | POA: Diagnosis not present

## 2017-11-12 DIAGNOSIS — J45909 Unspecified asthma, uncomplicated: Secondary | ICD-10-CM | POA: Insufficient documentation

## 2017-11-12 DIAGNOSIS — J4530 Mild persistent asthma, uncomplicated: Secondary | ICD-10-CM | POA: Diagnosis not present

## 2017-11-12 DIAGNOSIS — M7732 Calcaneal spur, left foot: Secondary | ICD-10-CM | POA: Diagnosis not present

## 2017-11-12 NOTE — Progress Notes (Addendum)
   HPI  Patient presents today for follow-up asthma as well as pneumonia vaccine    She states she is tolerating Symbicort well, she is concerned about thrush but states she has not had any symptoms. She has not been taking intermittently, she is unsure if it is helping.  Combivent is effective but she does not need it very often.  Patient would like her second pneumonia vaccine today.  PMH: Smoking status noted ROS: Per HPI  Objective: BP (!) 89/59   Pulse 69   Temp (!) 97.2 F (36.2 C) (Oral)   Ht 5\' 2"  (1.575 m)   Wt 209 lb 12.8 oz (95.2 kg)   BMI 38.37 kg/m  Gen: NAD, alert, cooperative with exam HEENT: NCAT CV: RRR, good S1/S2, no murmur Resp: CTABL, no wheezes, non-labored Ext: No edema, warm Neuro: Alert and oriented, No gross deficits  Assessment and plan:  #Asthma Likely diagnosis, patient has appointment scheduled with pulmonology, appreciate the recommendations and evaluation She certainly has multiple reasons to not be breathing well including CHF, however she is euvolemic currently.  Continue Symbicort, she does notice a distinct improvement since starting. Discussed Combivent as a rescue  #Counseling provided for all vaccines and vaccine components   Orders Placed This Encounter  Procedures  . Pneumococcal conjugate vaccine 13-valent    We also discussed osteoporosis, she has decided she would like to try Prolia.  We checked on her coverage and she has to fail to bisphosphonates prior to treatment with Prolia. She will try Fosamax, consider Boniva after that  Laroy Apple, MD [facility] 11/12/2017, 11:29 AM

## 2017-11-12 NOTE — Patient Instructions (Addendum)
Great to see you!  Continue symbicort 2 puffs twice daily, use combivent as needed.   Come back in August to see Dr. Lajuana Ripple.

## 2017-11-13 DIAGNOSIS — E119 Type 2 diabetes mellitus without complications: Secondary | ICD-10-CM | POA: Diagnosis not present

## 2017-11-13 DIAGNOSIS — H524 Presbyopia: Secondary | ICD-10-CM | POA: Diagnosis not present

## 2017-11-13 DIAGNOSIS — Z961 Presence of intraocular lens: Secondary | ICD-10-CM | POA: Diagnosis not present

## 2017-11-13 LAB — HM DIABETES EYE EXAM

## 2017-11-16 ENCOUNTER — Telehealth: Payer: Self-pay | Admitting: Family Medicine

## 2017-11-16 ENCOUNTER — Other Ambulatory Visit: Payer: Self-pay | Admitting: Family Medicine

## 2017-11-16 MED ORDER — FLUCONAZOLE 150 MG PO TABS: 150.0000 mg | ORAL_TABLET | Freq: Once | ORAL | 0 refills | Status: AC

## 2017-11-16 NOTE — Telephone Encounter (Signed)
Patient aware rx sent in and that she would need to be seen in the future for any antibiotics or antifungals.

## 2017-11-16 NOTE — Telephone Encounter (Signed)
Will send in this time but please inform patient that she will require an appt for ALL antibiotics/ antifungals in the future.

## 2017-11-18 ENCOUNTER — Telehealth: Payer: Self-pay | Admitting: Family Medicine

## 2017-11-18 NOTE — Telephone Encounter (Signed)
Aware.  Apply to exterior vaginal affected areas, twice daily after cleansing and drying.

## 2017-11-18 NOTE — Telephone Encounter (Signed)
Spoke to pt and went over directions on how to use rx. Pt voiced understanding.

## 2017-11-20 ENCOUNTER — Encounter: Payer: Self-pay | Admitting: Physician Assistant

## 2017-11-20 ENCOUNTER — Ambulatory Visit (INDEPENDENT_AMBULATORY_CARE_PROVIDER_SITE_OTHER): Payer: Medicare Other | Admitting: Physician Assistant

## 2017-11-20 VITALS — BP 100/73 | HR 83 | Temp 97.1°F | Ht 62.0 in | Wt 208.4 lb

## 2017-11-20 DIAGNOSIS — N898 Other specified noninflammatory disorders of vagina: Secondary | ICD-10-CM

## 2017-11-20 LAB — WET PREP FOR TRICH, YEAST, CLUE
Clue Cell Exam: NEGATIVE
Trichomonas Exam: NEGATIVE
Yeast Exam: NEGATIVE

## 2017-11-20 MED ORDER — FLUCONAZOLE 100 MG PO TABS: 100.0000 mg | ORAL_TABLET | Freq: Every day | ORAL | 0 refills | Status: DC

## 2017-11-21 NOTE — Progress Notes (Signed)
BP 100/73   Pulse 83   Temp (!) 97.1 F (36.2 C) (Oral)   Ht 5' 2"  (1.575 m)   Wt 208 lb 6.4 oz (94.5 kg)   BMI 38.12 kg/m    Subjective:    Patient ID: Amber Stephenson, female    DOB: July 08, 1945, 72 y.o.   MRN: [id number]  HPI: [name] is a 72 y.o. female presenting on 11/20/2017 for Vaginitis (x 1 month )  Of recent weeks the patient has had vaginal itching and external itching.  She states it burns when she urinates across the external genitalia.  She has tried clotrimazole cream that she had before.  And she did take Diflucan 2 days ago without much relief.  She denies any significant antibiotics in recent months.  She denies any changes in her personal hygiene products or clothing.  Past Medical History:  Diagnosis Date  . Atrial fibrillation ([place])   . Bipolar affective ([place])   . CHF (congestive heart failure) ([place])   . Chronic back pain   . Diabetes mellitus without complication ([place])   . Hyperlipidemia   . Hypertension   . Pain management   . Renal insufficiency    Relevant past medical, surgical, family and social history reviewed and updated as indicated. Interim medical history since our last visit reviewed. Allergies and medications reviewed and updated. DATA REVIEWED: CHART IN EPIC  Family History reviewed for pertinent findings.  Review of Systems  Constitutional: Negative.   HENT: Negative.   Eyes: Negative.   Respiratory: Negative.   Gastrointestinal: Negative.   Genitourinary: Positive for dysuria. Negative for difficulty urinating, dyspareunia, frequency, genital sores, pelvic pain, urgency and vaginal discharge.    Allergies as of 11/20/2017      Reactions   Ivp Dye [iodinated Diagnostic Agents] Anaphylaxis, Swelling   Throat closes      Medication List        Accurate as of 11/20/17 11:59 PM. Always use your most recent med list.          ARIPiprazole 10 MG tablet Commonly known as:  ABILIFY TAKE 3 TABLETS EVERY MORNING     atorvastatin 40 MG tablet Commonly known as:  LIPITOR TAKE 1 TABLET DAILY   carbamazepine 100 MG 12 hr capsule Commonly known as:  CARBATROL Take 1 capsule (100 mg total) by mouth 2 (two) times daily.   clotrimazole 1 % cream Commonly known as:  LOTRIMIN APPLY EXTERNALLY TO THE AFFECTED AREA TWICE DAILY   diltiazem 120 MG 24 hr capsule Commonly known as:  CARDIZEM CD Take 1 capsule (120 mg total) by mouth at bedtime.   Dulaglutide 1.5 MG/0.5ML Sopn Commonly known as:  TRULICITY Inject 1.5 mg into the skin once a week.   ELIQUIS 5 MG Tabs tablet Generic drug:  apixaban Take 5 mg by mouth 2 (two) times daily.   fenofibrate 145 MG tablet Commonly known as:  TRICOR Take 1 tablet (145 mg total) by mouth daily.   fluconazole 100 MG tablet Commonly known as:  DIFLUCAN Take 1 tablet (100 mg total) by mouth daily.   freestyle lancets Test tid. DX E11.22   FREESTYLE LITE Devi Use to check blood sugar tid. DX E11.22   FREESTYLE LITE test strip Generic drug:  glucose blood USE FOR TESTING SUGAR THREE TIMES DAILY   furosemide 20 MG tablet Commonly known as:  LASIX Take 1 tablet (20 mg total) by mouth 2 (two) times daily as needed.   Insulin Glargine  100 UNIT/ML Solostar Pen Commonly known as:  LANTUS SOLOSTAR INJECT 50 UNITS UNDER THE SKIN TWICE A DAY   Insulin Pen Needle 32G X 4 MM Misc Commonly known as:  SURE COMFORT PEN NEEDLES Use with insulin pens as directed. Dx E11.9   Ipratropium-Albuterol 20-100 MCG/ACT Aers respimat Commonly known as:  COMBIVENT RESPIMAT Inhale 1 puff into the lungs every 6 (six) hours.   lidocaine 2 % solution Commonly known as:  XYLOCAINE SWISH AND SPIT ENOUGH TO FILL MOUTH DAILY AS NEEDED FOR MOUTH PAIN   lidocaine 5 % Commonly known as:  LIDODERM Place 1 patch onto the skin daily. Remove & Discard patch within 12 hours or as directed by MD   LORazepam 1 MG tablet Commonly known as:  ATIVAN Take 1 tab (1 mg) in the morning and 2  tabs (2 mg ) at bedtime   metoprolol tartrate 100 MG tablet Commonly known as:  LOPRESSOR TAKE 1 TABLET TWICE A DAY   metoprolol tartrate 25 MG tablet Commonly known as:  LOPRESSOR TAKE 1 TABLET TWICE A DAY (TO BE TAKEN ALONG WITH THE LOPRESSOR 100 MG TWICE A DAY)   NARCAN 4 MG/0.1ML Liqd nasal spray kit Generic drug:  naloxone Place 1 spray into the nose once as needed (opiod overdose).   oxyCODONE-acetaminophen 5-325 MG tablet Commonly known as:  PERCOCET/ROXICET Take 1 tablet by mouth every 8 (eight) hours.   pregabalin 150 MG capsule Commonly known as:  LYRICA Take 1 capsule (150 mg total) by mouth 2 (two) times daily.   rizatriptan 10 MG disintegrating tablet Commonly known as:  MAXALT-MLT DISSOLVE 1 TABLET UNDER THE TONGUE IMMEDIATELY AT SIGN OF HEADACHE. MAY REPEAT DOSE IN 1 HOUR IF NEEDED. MAX OF 3 TABLETS IN 24 HOURS   SUMAtriptan 100 MG tablet Commonly known as:  IMITREX TAKE 1 TABLET BY MOUTH AT ONSET OF HEADACHE. MAY REPEAT IN 2 HOURS IF HEADACHE PERSISTS OR RECURS.LIMIT 2 PER 24 HOURS   tizanidine 6 MG capsule Commonly known as:  ZANAFLEX TAKE 1 CAPSULE THREE TIMES A DAY AS NEEDED FOR MUSCLE SPASMS   traZODone 100 MG tablet Commonly known as:  DESYREL Take 3 tablets (300 mg total) by mouth at bedtime.   Vitamin D3 1000 units Caps Take 1,000 Units by mouth daily.          Objective:    BP 100/73   Pulse 83   Temp (!) 97.1 F (36.2 C) (Oral)   Ht 5' 2"  (1.575 m)   Wt 208 lb 6.4 oz (94.5 kg)   BMI 38.12 kg/m   Allergies  Allergen Reactions  . Ivp Dye [Iodinated Diagnostic Agents] Anaphylaxis and Swelling    Throat closes    Wt Readings from Last 3 Encounters:  11/20/17 208 lb 6.4 oz (94.5 kg)  11/12/17 209 lb 12.8 oz (95.2 kg)  10/20/17 209 lb 12.8 oz (95.2 kg)    Physical Exam  Constitutional: She is oriented to person, place, and time. She appears well-developed and well-nourished.  HENT:  Head: Normocephalic and atraumatic.  Eyes:  Pupils are equal, round, and reactive to light. Conjunctivae and EOM are normal.  Cardiovascular: Normal rate, regular rhythm, normal heart sounds and intact distal pulses.  Pulmonary/Chest: Effort normal and breath sounds normal.  Abdominal: Soft. Bowel sounds are normal.  Neurological: She is alert and oriented to person, place, and time. She has normal reflexes.  Skin: Skin is warm and dry. No rash noted.  Psychiatric: She has a normal mood  and affect. Her behavior is normal. Judgment and thought content normal.    Results for orders placed or performed in visit on 11/20/17  WET PREP FOR [place], YEAST, CLUE  Result Value Ref Range   Trichomonas Exam Negative Negative   Yeast Exam Negative Negative   Clue Cell Exam Negative Negative      Assessment & Plan:   1. Vaginal itching - WET PREP FOR TRICH, YEAST, CLUE - fluconazole (DIFLUCAN) 100 MG tablet; Take 1 tablet (100 mg total) by mouth daily.  Dispense: 5 tablet; Refill: 0  CONSIDER atrophic vaginitis or sclerosis   Continue all other maintenance medications as listed above.  Follow up plan: No follow-ups on file.  Educational handout given for [place] PA-C [facility] [address]  [place], [place] [address] [phone]   11/21/2017, 10:38 AM

## 2017-11-24 ENCOUNTER — Telehealth: Payer: Self-pay | Admitting: Family Medicine

## 2017-11-24 DIAGNOSIS — Z79899 Other long term (current) drug therapy: Secondary | ICD-10-CM | POA: Diagnosis not present

## 2017-11-24 DIAGNOSIS — M5136 Other intervertebral disc degeneration, lumbar region: Secondary | ICD-10-CM | POA: Diagnosis not present

## 2017-11-24 NOTE — Telephone Encounter (Signed)
She needs to see a gynecologist because she most likely has lichen sclerosus or atrophic vaginitis. They will be able to help. I will discontinue any meds and use cool compresses to soothe. [facility] in [place] is a choice, or if she has her own.

## 2017-11-24 NOTE — Telephone Encounter (Signed)
Returned patient's phone call.  Patient states she is now having vaginal discharge with no odor and vaginal itching for the last two days.  Patient would like for something to be sent to pharmacy

## 2017-11-25 ENCOUNTER — Telehealth: Payer: Self-pay | Admitting: *Deleted

## 2017-11-25 ENCOUNTER — Encounter: Payer: Self-pay | Admitting: Cardiovascular Disease

## 2017-11-25 NOTE — Telephone Encounter (Signed)
Pt notified of recommendation Verbalizes understanding 

## 2017-11-26 DIAGNOSIS — M47816 Spondylosis without myelopathy or radiculopathy, lumbar region: Secondary | ICD-10-CM | POA: Diagnosis not present

## 2017-11-26 DIAGNOSIS — M545 Low back pain: Secondary | ICD-10-CM | POA: Diagnosis not present

## 2017-11-26 DIAGNOSIS — M9903 Segmental and somatic dysfunction of lumbar region: Secondary | ICD-10-CM | POA: Diagnosis not present

## 2017-11-26 DIAGNOSIS — M62830 Muscle spasm of back: Secondary | ICD-10-CM | POA: Diagnosis not present

## 2017-11-27 ENCOUNTER — Ambulatory Visit (INDEPENDENT_AMBULATORY_CARE_PROVIDER_SITE_OTHER): Payer: Medicare Other | Admitting: Urology

## 2017-11-27 DIAGNOSIS — R350 Frequency of micturition: Secondary | ICD-10-CM

## 2017-11-27 DIAGNOSIS — N816 Rectocele: Secondary | ICD-10-CM

## 2017-11-27 DIAGNOSIS — N3941 Urge incontinence: Secondary | ICD-10-CM

## 2017-11-27 DIAGNOSIS — N952 Postmenopausal atrophic vaginitis: Secondary | ICD-10-CM | POA: Diagnosis not present

## 2017-11-30 ENCOUNTER — Ambulatory Visit (INDEPENDENT_AMBULATORY_CARE_PROVIDER_SITE_OTHER): Payer: Medicare Other | Admitting: Family Medicine

## 2017-11-30 ENCOUNTER — Encounter: Payer: Self-pay | Admitting: Family Medicine

## 2017-11-30 VITALS — BP 128/78 | HR 104 | Temp 97.9°F | Ht 62.0 in | Wt 210.0 lb

## 2017-11-30 DIAGNOSIS — M810 Age-related osteoporosis without current pathological fracture: Secondary | ICD-10-CM

## 2017-11-30 DIAGNOSIS — N898 Other specified noninflammatory disorders of vagina: Secondary | ICD-10-CM | POA: Diagnosis not present

## 2017-11-30 MED ORDER — IBANDRONATE SODIUM 150 MG PO TABS: ORAL_TABLET | ORAL | 3 refills | Status: DC

## 2017-11-30 NOTE — Progress Notes (Signed)
   HPI  Patient presents today follow-up for osteoporosis.  Patient states that she tried Fosamax and that it gave her an upset stomach, she would like to try something else.  She is taking times as prescribed.  She is also getting at least 2000 units of vitamin D 3 daily.  PMH: Smoking status noted ROS: Per HPI  Objective: BP 128/78   Pulse (!) 104   Temp 97.9 F (36.6 C) (Oral)   Ht 5\' 2"  (1.575 m)   Wt 210 lb (95.3 kg)   BMI 38.41 kg/m  Gen: NAD, alert, cooperative with exam HEENT: NCAT, EOMI, PERRL CV: RRR, good S1/S2, no murmur Resp: CTABL, no wheezes, non-labored Ext: No edema, warm Neuro: Alert and oriented, No gross deficits  Assessment and plan:  #Osteoporosis Patient has failed Fosamax due to upset stomach, will try Boniva She may be a candidate for Prolia. Follow-up with new PCP in 4 to 6 weeks.  Vaginal itching-uncertain for atrophic vaginitis, she will call for follow-up with GYN  Meds ordered this encounter  Medications  . ibandronate (BONIVA) 150 MG tablet    Sig: 1 pill every 30 days, Take in the morning with a full glass of water, on an empty stomach, stay upright for 30 minutes    Dispense:  1 tablet    Refill:  3    Please discontinue alendronate    Laroy Apple, MD [facility] 11/30/2017, 3:22 PM

## 2017-11-30 NOTE — Patient Instructions (Addendum)
Great to see you!  Stop alendronate and start boniva, 1 pill once a month. Come back n 1-3 months to see Dr. Lajuana Ripple for follow up osteoporosis.   Dr. Rogers Blocker Number: [phone]

## 2017-12-08 ENCOUNTER — Encounter: Payer: Self-pay | Admitting: Pulmonary Disease

## 2017-12-08 ENCOUNTER — Ambulatory Visit (INDEPENDENT_AMBULATORY_CARE_PROVIDER_SITE_OTHER): Payer: Medicare Other | Admitting: Pulmonary Disease

## 2017-12-08 VITALS — BP 128/76 | HR 81 | Ht 62.0 in | Wt 212.0 lb

## 2017-12-08 DIAGNOSIS — R0602 Shortness of breath: Secondary | ICD-10-CM | POA: Diagnosis not present

## 2017-12-08 DIAGNOSIS — I4891 Unspecified atrial fibrillation: Secondary | ICD-10-CM

## 2017-12-08 DIAGNOSIS — I482 Chronic atrial fibrillation: Secondary | ICD-10-CM

## 2017-12-08 DIAGNOSIS — I4821 Permanent atrial fibrillation: Secondary | ICD-10-CM

## 2017-12-08 DIAGNOSIS — J4520 Mild intermittent asthma, uncomplicated: Secondary | ICD-10-CM | POA: Diagnosis not present

## 2017-12-08 NOTE — Progress Notes (Signed)
Synopsis: Referred in July 2019 for evaluation of shortness of breath, had a presumptive diagnosis of asthma   Subjective:   PATIENT ID: Amber Stephenson GENDER: female DOB: 08/15/1945, MRN: [id number]   HPI  Chief Complaint  Patient presents with  . New Consult    shortness of breath    She says that she can't breathe > has been a problem for 5 months now > happened suddenly > she noticed it when she was trying to go to sleep > bending over makes it worse > she has it with exertion > can walk 50 feet  > late 2018 she was feeling OK > she says that she noticed incontinence of urine came on at the same time> saw urology for this > she has never had a lung problem > no family members with lung problems > she only smoked for a few years in high school > she worked in [employer], never worked in a dusty or dirty environment > lives in a house, has a crawlspace, no mold or mildew > symbicort doesn't help but she doesn't think she knows how ot use it correctly  Cough: > has a little cough with some mucus production > this is new  Wheezing: > has a little at night   Past Medical History:  Diagnosis Date  . Atrial fibrillation ([place])   . Bipolar affective ([place])   . CHF (congestive heart failure) ([place])   . Chronic back pain   . Diabetes mellitus without complication ([place])   . Hyperlipidemia   . Hypertension   . Pain management   . Renal insufficiency      Family History  Problem Relation Age of Onset  . Diabetes Mother   . Heart disease Mother   . Heart disease Brother   . Heart disease Sister        CABG  . Alcohol abuse Sister   . Mental illness Brother   . Diabetes Brother      Social History   Socioeconomic History  . Marital status: Married    Spouse name: Not on file  . Number of children: 3  . Years of education: 16  . Highest education level: Bachelor's degree (e.g., BA, AB, BS)  Occupational History  . Not on file  Social Needs  .  Financial resource strain: Not hard at all  . Food insecurity:    Worry: Never true    Inability: Never true  . Transportation needs:    Medical: No    Non-medical: No  Tobacco Use  . Smoking status: Never Smoker  . Smokeless tobacco: Never Used  Substance and Sexual Activity  . Alcohol use: No    Alcohol/week: 0.0 oz  . Drug use: No  . Sexual activity: Yes    Partners: Male    Birth control/protection: Post-menopausal  Lifestyle  . Physical activity:    Days per week: 0 days    Minutes per session: 0 min  . Stress: Not at all  Relationships  . Social connections:    Talks on phone: More than three times a week    Gets together: Once a week    Attends religious service: More than 4 times per year    Active member of club or organization: No    Attends meetings of clubs or organizations: Never    Relationship status: Married  . Intimate partner violence:    Fear of current or ex partner: No    Emotionally  abused: No    Physically abused: No    Forced sexual activity: No  Other Topics Concern  . Not on file  Social History Narrative   Lives at home with husband.      Allergies  Allergen Reactions  . Ivp Dye [Iodinated Diagnostic Agents] Anaphylaxis and Swelling    Throat closes     Outpatient Medications Prior to Visit  Medication Sig Dispense Refill  . apixaban (ELIQUIS) 5 MG TABS tablet Take 5 mg by mouth 2 (two) times daily.    . ARIPiprazole (ABILIFY) 10 MG tablet TAKE 3 TABLETS EVERY MORNING (Patient taking differently: Take 30 mg by mouth daily. ) 270 tablet 2  . atorvastatin (LIPITOR) 40 MG tablet TAKE 1 TABLET DAILY (Patient taking differently: TAKE 1 TABLET (40 MG) BY MOUTH DAILY) 90 tablet 1  . Blood Glucose Monitoring Suppl (FREESTYLE LITE) DEVI Use to check blood sugar tid. DX E11.22 1 each 0  . carbamazepine (CARBATROL) 100 MG 12 hr capsule Take 1 capsule (100 mg total) by mouth 2 (two) times daily. 180 capsule 6  . Cholecalciferol (VITAMIN D3) 1000  UNITS CAPS Take 1,000 Units by mouth daily.    . clotrimazole (LOTRIMIN) 1 % cream APPLY EXTERNALLY TO THE AFFECTED AREA TWICE DAILY 30 g 2  . diltiazem (CARDIZEM CD) 120 MG 24 hr capsule Take 1 capsule (120 mg total) by mouth at bedtime. 90 capsule 3  . Dulaglutide (TRULICITY) 1.5 CH/8.5ID SOPN Inject 1.5 mg into the skin once a week. 12 pen 3  . fenofibrate (TRICOR) 145 MG tablet Take 1 tablet (145 mg total) by mouth daily. 90 tablet 0  . FREESTYLE LITE test strip USE FOR TESTING SUGAR THREE TIMES DAILY 100 each 3  . furosemide (LASIX) 20 MG tablet Take 1 tablet (20 mg total) by mouth 2 (two) times daily as needed. 180 tablet 1  . Insulin Glargine (LANTUS SOLOSTAR) 100 UNIT/ML Solostar Pen INJECT 50 UNITS UNDER THE SKIN TWICE A DAY 90 mL 0  . Insulin Pen Needle (SURE COMFORT PEN NEEDLES) 32G X 4 MM MISC Use with insulin pens as directed. Dx E11.9 300 each 11  . Ipratropium-Albuterol (COMBIVENT RESPIMAT) 20-100 MCG/ACT AERS respimat Inhale 1 puff into the lungs every 6 (six) hours. 4 g 2  . Lancets (FREESTYLE) lancets Test tid. DX E11.22 100 each 5  . lidocaine (LIDODERM) 5 % Place 1 patch onto the skin daily. Remove & Discard patch within 12 hours or as directed by MD (Patient taking differently: Place 1 patch onto the skin daily as needed (pain). Remove & Discard patch within 12 hours or as directed by MD) 30 patch 2  . lidocaine (XYLOCAINE) 2 % solution SWISH AND SPIT ENOUGH TO FILL MOUTH DAILY AS NEEDED FOR MOUTH PAIN 100 mL 0  . LORazepam (ATIVAN) 1 MG tablet Take 1 tab (1 mg) in the morning and 2 tabs (2 mg ) at bedtime 90 tablet 2  . metoprolol tartrate (LOPRESSOR) 100 MG tablet TAKE 1 TABLET TWICE A DAY (Patient taking differently: TAKE 1 TABLET (100 MG) BY MOUTH TWICE DAILY WITH  A 25 MG TABLET FOR A TOTAL DOSE OF 125 MG) 180 tablet 3  . metoprolol tartrate (LOPRESSOR) 25 MG tablet TAKE 1 TABLET TWICE A DAY (TO BE TAKEN ALONG WITH THE LOPRESSOR 100 MG TWICE A DAY) 180 tablet 0  . naloxone  (NARCAN) nasal spray 4 mg/0.1 mL Place 1 spray into the nose once as needed (opiod overdose).    Marland Kitchen  oxyCODONE-acetaminophen (PERCOCET/ROXICET) 5-325 MG tablet Take 1 tablet by mouth every 8 (eight) hours.    . pregabalin (LYRICA) 150 MG capsule Take 1 capsule (150 mg total) by mouth 2 (two) times daily. 180 capsule 2  . rizatriptan (MAXALT-MLT) 10 MG disintegrating tablet DISSOLVE 1 TABLET UNDER THE TONGUE IMMEDIATELY AT SIGN OF HEADACHE. MAY REPEAT DOSE IN 1 HOUR IF NEEDED. MAX OF 3 TABLETS IN 24 HOURS 10 tablet 3  . SUMAtriptan (IMITREX) 100 MG tablet TAKE 1 TABLET BY MOUTH AT ONSET OF HEADACHE. MAY REPEAT IN 2 HOURS IF HEADACHE PERSISTS OR RECURS.LIMIT 2 PER 24 HOURS 10 tablet 2  . tizanidine (ZANAFLEX) 6 MG capsule TAKE 1 CAPSULE THREE TIMES A DAY AS NEEDED FOR MUSCLE SPASMS (Patient taking differently: Take 6 mg by mouth 3 (three) times daily as needed for muscle spasms. ) 90 capsule 3  . traZODone (DESYREL) 100 MG tablet Take 3 tablets (300 mg total) by mouth at bedtime. 90 tablet 2  . ibandronate (BONIVA) 150 MG tablet 1 pill every 30 days, Take in the morning with a full glass of water, on an empty stomach, stay upright for 30 minutes (Patient not taking: Reported on 12/08/2017) 1 tablet 3  . alendronate (FOSAMAX) 70 MG tablet Take 70 mg by mouth once a week. Take with a full glass of water on an empty stomach.     No facility-administered medications prior to visit.     Review of Systems  Constitutional: Negative for chills, fever, malaise/fatigue and weight loss.  HENT: Negative for congestion, nosebleeds, sinus pain and sore throat.   Eyes: Negative for photophobia, pain and discharge.  Respiratory: Positive for cough, sputum production and shortness of breath. Negative for hemoptysis and wheezing.   Cardiovascular: Negative for chest pain, palpitations, orthopnea and leg swelling.  Gastrointestinal: Negative for abdominal pain, constipation, diarrhea, nausea and vomiting.  Genitourinary:  Negative for dysuria, frequency, hematuria and urgency.  Musculoskeletal: Negative for back pain, joint pain, myalgias and neck pain.  Skin: Negative for itching and rash.  Neurological: Negative for tingling, tremors, sensory change, speech change, focal weakness, seizures, weakness and headaches.  Psychiatric/Behavioral: Negative for memory loss, substance abuse and suicidal ideas. The patient is not nervous/anxious.       Objective:  Physical Exam   Vitals:   12/08/17 1440  BP: 128/76  Pulse: 81  SpO2: 95%  Weight: 212 lb (96.2 kg)  Height: 5\' 2"  (1.575 m)   RA  Gen: well appearing, no acute distress HENT: NCAT, OP clear, neck supple without masses Eyes: PERRL, EOMi Lymph: no cervical lymphadenopathy PULM: Crackles R base B CV: Irreg irreg, no mgr, no JVD GI: BS+, soft, nontender, no hsm Derm: no rash or skin breakdown MSK: normal bulk and tone Neuro: A&Ox4, CN II-XII intact, strength 5/5 in all 4 extremities Psyche: normal mood and affect   CBC    Component Value Date/Time   WBC 7.3 10/16/2017 1600   RBC 4.46 10/16/2017 1600   HGB 13.2 10/16/2017 1600   HGB 14.2 05/27/2017 1319   HCT 39.8 10/16/2017 1600   HCT 43.3 05/27/2017 1319   PLT 208 10/16/2017 1600   PLT 332 05/27/2017 1319   MCV 89.2 10/16/2017 1600   MCV 90 05/27/2017 1319   MCH 29.6 10/16/2017 1600   MCHC 33.2 10/16/2017 1600   RDW 13.1 10/16/2017 1600   RDW 14.7 05/27/2017 1319   LYMPHSABS 2.0 10/16/2017 1600   LYMPHSABS 4.7 (H) 05/27/2017 1319   MONOABS 0.5 10/16/2017  1600   EOSABS 0.1 10/16/2017 1600   EOSABS 0.1 05/27/2017 1319   BASOSABS 0.0 10/16/2017 1600   BASOSABS 0.0 05/27/2017 1319     Chest imaging: May 2019 chest x-ray showed some basilar scarring versus atelectasis  PFT:   Labs:  Path:  Echo: September 2016 echocardiogram LVEF 55 to 60%, left atrium severely dilated, trivial mitral regurgitation, PA systolic pressure 25 mmHg Heart Catheterization:  Records from  her visit with her primary care physician in June 2019 reviewed where she was seen for possible asthma and treated with Symbicort.  Records from her visit with Dr. Bronson Ing in May 2019 reviewed where she was seen and treated for A. fib and congestive heart failure.  The     Assessment & Plan:   Mild intermittent asthma without complication  Atrial fibrillation with RVR (HCC)  Permanent atrial fibrillation (HCC)  Shortness of breath  Discussion: This is a very pleasant 72 year old female with a past medical history significant for morbid obesity, atrial fibrillation and diastolic heart failure who comes to my clinic today for evaluation of shortness of breath.  Objectively on physical exam she does have a few crackles in the bases of her lungs and I have been able to appreciate some of the scarring in the bases seen by the radiologist.  It is not clear to me what is causing this scarring process so I would like to investigate further with a high-resolution CT scan of the chest to get a better picture of the pulmonary parenchyma.  We also need to get lung function testing.  So in summary, I am suspicious of an underlying lung problem which may be contributing to her shortness of breath in addition to the obesity, A. fib, deconditioning, and diastolic heart failure.  It is not clear to me that she has asthma but I think it is reasonable to continue taking the Symbicort for now.  We did review technique today.  Plan: Shortness of breath: We will check a lung function test We will check a high-resolution CT scan of the chest We may consider repeating an echocardiogram depending on the results of these tests  Possible asthma: Keep taking Symbicort 2 puffs twice a day with a spacer no matter how you feel We will review the need for this medicine after we have seen the results of the lung function test and the CT scan  Atrial fibrillation: Continue diltiazem as directed by Dr.  Bronson Ing  We will see you back in in 2-3 weeks to go over these results     Current Outpatient Medications:  .  apixaban (ELIQUIS) 5 MG TABS tablet, Take 5 mg by mouth 2 (two) times daily., Disp: , Rfl:  .  ARIPiprazole (ABILIFY) 10 MG tablet, TAKE 3 TABLETS EVERY MORNING (Patient taking differently: Take 30 mg by mouth daily. ), Disp: 270 tablet, Rfl: 2 .  atorvastatin (LIPITOR) 40 MG tablet, TAKE 1 TABLET DAILY (Patient taking differently: TAKE 1 TABLET (40 MG) BY MOUTH DAILY), Disp: 90 tablet, Rfl: 1 .  Blood Glucose Monitoring Suppl (FREESTYLE LITE) DEVI, Use to check blood sugar tid. DX E11.22, Disp: 1 each, Rfl: 0 .  carbamazepine (CARBATROL) 100 MG 12 hr capsule, Take 1 capsule (100 mg total) by mouth 2 (two) times daily., Disp: 180 capsule, Rfl: 6 .  Cholecalciferol (VITAMIN D3) 1000 UNITS CAPS, Take 1,000 Units by mouth daily., Disp: , Rfl:  .  clotrimazole (LOTRIMIN) 1 % cream, APPLY EXTERNALLY TO THE AFFECTED AREA TWICE DAILY,  Disp: 30 g, Rfl: 2 .  diltiazem (CARDIZEM CD) 120 MG 24 hr capsule, Take 1 capsule (120 mg total) by mouth at bedtime., Disp: 90 capsule, Rfl: 3 .  Dulaglutide (TRULICITY) 1.5 WT/8.8EK SOPN, Inject 1.5 mg into the skin once a week., Disp: 12 pen, Rfl: 3 .  fenofibrate (TRICOR) 145 MG tablet, Take 1 tablet (145 mg total) by mouth daily., Disp: 90 tablet, Rfl: 0 .  FREESTYLE LITE test strip, USE FOR TESTING SUGAR THREE TIMES DAILY, Disp: 100 each, Rfl: 3 .  furosemide (LASIX) 20 MG tablet, Take 1 tablet (20 mg total) by mouth 2 (two) times daily as needed., Disp: 180 tablet, Rfl: 1 .  Insulin Glargine (LANTUS SOLOSTAR) 100 UNIT/ML Solostar Pen, INJECT 50 UNITS UNDER THE SKIN TWICE A DAY, Disp: 90 mL, Rfl: 0 .  Insulin Pen Needle (SURE COMFORT PEN NEEDLES) 32G X 4 MM MISC, Use with insulin pens as directed. Dx E11.9, Disp: 300 each, Rfl: 11 .  Ipratropium-Albuterol (COMBIVENT RESPIMAT) 20-100 MCG/ACT AERS respimat, Inhale 1 puff into the lungs every 6 (six)  hours., Disp: 4 g, Rfl: 2 .  Lancets (FREESTYLE) lancets, Test tid. DX E11.22, Disp: 100 each, Rfl: 5 .  lidocaine (LIDODERM) 5 %, Place 1 patch onto the skin daily. Remove & Discard patch within 12 hours or as directed by MD (Patient taking differently: Place 1 patch onto the skin daily as needed (pain). Remove & Discard patch within 12 hours or as directed by MD), Disp: 30 patch, Rfl: 2 .  lidocaine (XYLOCAINE) 2 % solution, SWISH AND SPIT ENOUGH TO FILL MOUTH DAILY AS NEEDED FOR MOUTH PAIN, Disp: 100 mL, Rfl: 0 .  LORazepam (ATIVAN) 1 MG tablet, Take 1 tab (1 mg) in the morning and 2 tabs (2 mg ) at bedtime, Disp: 90 tablet, Rfl: 2 .  metoprolol tartrate (LOPRESSOR) 100 MG tablet, TAKE 1 TABLET TWICE A DAY (Patient taking differently: TAKE 1 TABLET (100 MG) BY MOUTH TWICE DAILY WITH  A 25 MG TABLET FOR A TOTAL DOSE OF 125 MG), Disp: 180 tablet, Rfl: 3 .  metoprolol tartrate (LOPRESSOR) 25 MG tablet, TAKE 1 TABLET TWICE A DAY (TO BE TAKEN ALONG WITH THE LOPRESSOR 100 MG TWICE A DAY), Disp: 180 tablet, Rfl: 0 .  naloxone (NARCAN) nasal spray 4 mg/0.1 mL, Place 1 spray into the nose once as needed (opiod overdose)., Disp: , Rfl:  .  oxyCODONE-acetaminophen (PERCOCET/ROXICET) 5-325 MG tablet, Take 1 tablet by mouth every 8 (eight) hours., Disp: , Rfl:  .  pregabalin (LYRICA) 150 MG capsule, Take 1 capsule (150 mg total) by mouth 2 (two) times daily., Disp: 180 capsule, Rfl: 2 .  rizatriptan (MAXALT-MLT) 10 MG disintegrating tablet, DISSOLVE 1 TABLET UNDER THE TONGUE IMMEDIATELY AT SIGN OF HEADACHE. MAY REPEAT DOSE IN 1 HOUR IF NEEDED. MAX OF 3 TABLETS IN 24 HOURS, Disp: 10 tablet, Rfl: 3 .  SUMAtriptan (IMITREX) 100 MG tablet, TAKE 1 TABLET BY MOUTH AT ONSET OF HEADACHE. MAY REPEAT IN 2 HOURS IF HEADACHE PERSISTS OR RECURS.LIMIT 2 PER 24 HOURS, Disp: 10 tablet, Rfl: 2 .  tizanidine (ZANAFLEX) 6 MG capsule, TAKE 1 CAPSULE THREE TIMES A DAY AS NEEDED FOR MUSCLE SPASMS (Patient taking differently: Take 6 mg  by mouth 3 (three) times daily as needed for muscle spasms. ), Disp: 90 capsule, Rfl: 3 .  traZODone (DESYREL) 100 MG tablet, Take 3 tablets (300 mg total) by mouth at bedtime., Disp: 90 tablet, Rfl: 2 .  ibandronate (BONIVA)  150 MG tablet, 1 pill every 30 days, Take in the morning with a full glass of water, on an empty stomach, stay upright for 30 minutes (Patient not taking: Reported on 12/08/2017), Disp: 1 tablet, Rfl: 3

## 2017-12-08 NOTE — Patient Instructions (Signed)
Shortness of breath: We will check a lung function test We will check a high-resolution CT scan of the chest We may consider repeating an echocardiogram depending on the results of these tests  Possible asthma: Keep taking Symbicort 2 puffs twice a day with a spacer no matter how you feel We will review the need for this medicine after we have seen the results of the lung function test and the CT scan  Atrial fibrillation: Continue diltiazem as directed by Dr. Bronson Ing  We will see you back in in 2-3 weeks to go over these results

## 2017-12-08 NOTE — Addendum Note (Signed)
Addended by: Dolores Lory on: 12/08/2017 03:17 PM   Modules accepted: Orders

## 2017-12-10 ENCOUNTER — Other Ambulatory Visit: Payer: Self-pay | Admitting: Family Medicine

## 2017-12-10 ENCOUNTER — Telehealth: Payer: Self-pay | Admitting: Family Medicine

## 2017-12-10 DIAGNOSIS — M47816 Spondylosis without myelopathy or radiculopathy, lumbar region: Secondary | ICD-10-CM | POA: Diagnosis not present

## 2017-12-10 DIAGNOSIS — M545 Low back pain: Secondary | ICD-10-CM | POA: Diagnosis not present

## 2017-12-10 DIAGNOSIS — M62830 Muscle spasm of back: Secondary | ICD-10-CM | POA: Diagnosis not present

## 2017-12-10 DIAGNOSIS — M9903 Segmental and somatic dysfunction of lumbar region: Secondary | ICD-10-CM | POA: Diagnosis not present

## 2017-12-10 MED ORDER — APIXABAN 5 MG PO TABS: 5.0000 mg | ORAL_TABLET | Freq: Two times a day (BID) | ORAL | 0 refills | Status: DC

## 2017-12-10 NOTE — Telephone Encounter (Signed)
What is the name of the medication? eliguis 30 day refill  Have you contacted your pharmacy to request a refill? Yes but [facility] get [facility]  Which pharmacy would you like this sent to? [facility] in [place] on [address]   Patient notified that their request is being sent to the clinical staff for review and that they should receive a call once it is complete. If they do not receive a call within 24 hours they can check with their pharmacy or our office.

## 2017-12-10 NOTE — Telephone Encounter (Signed)
Last seen 11/30/17  Last lipid 10/17

## 2017-12-10 NOTE — Telephone Encounter (Signed)
Samples placed up front and patient aware.

## 2017-12-10 NOTE — Telephone Encounter (Signed)
LMOVM refill sent to mail order

## 2017-12-15 ENCOUNTER — Ambulatory Visit (INDEPENDENT_AMBULATORY_CARE_PROVIDER_SITE_OTHER): Payer: Medicare Other | Admitting: Family Medicine

## 2017-12-15 ENCOUNTER — Encounter: Payer: Self-pay | Admitting: Family Medicine

## 2017-12-15 ENCOUNTER — Telehealth: Payer: Self-pay | Admitting: *Deleted

## 2017-12-15 VITALS — BP 105/66 | HR 69 | Temp 97.8°F | Ht 62.0 in | Wt 211.2 lb

## 2017-12-15 DIAGNOSIS — M62838 Other muscle spasm: Secondary | ICD-10-CM

## 2017-12-15 DIAGNOSIS — M810 Age-related osteoporosis without current pathological fracture: Secondary | ICD-10-CM

## 2017-12-15 DIAGNOSIS — K120 Recurrent oral aphthae: Secondary | ICD-10-CM

## 2017-12-15 HISTORY — DX: Age-related osteoporosis without current pathological fracture: M81.0

## 2017-12-15 MED ORDER — SUMATRIPTAN SUCCINATE 100 MG PO TABS: ORAL_TABLET | ORAL | 11 refills | Status: DC

## 2017-12-15 MED ORDER — METHOCARBAMOL 500 MG PO TABS: 500.0000 mg | ORAL_TABLET | Freq: Three times a day (TID) | ORAL | 3 refills | Status: DC | PRN

## 2017-12-15 MED ORDER — LIDOCAINE VISCOUS HCL 2 % MT SOLN: OROMUCOSAL | 2 refills | Status: DC

## 2017-12-15 NOTE — Progress Notes (Signed)
   HPI  Patient presents today here for follow-up chronic medical conditions.  Muscle spasm of the back Previously helped by Zanaflex, however it seems to be causing bad dreams.  Patient has tender ulcers that developed in her mouth that come and go.  She also uses warm salt water, would like refill of Xylocaine solution.  Patient has osteoporosis she has tried Fosamax and Boniva which have both caused upset stomach and that she would not like to continue. She would like to try Prolia  PMH: Smoking status noted ROS: Per HPI  Objective: BP 105/66   Pulse 69   Temp 97.8 F (36.6 C) (Oral)   Ht 5\' 2"  (1.575 m)   Wt 211 lb 3.2 oz (95.8 kg)   BMI 38.63 kg/m  Gen: NAD, alert, cooperative with exam HEENT: NCAT, oral ulcers not seen on exam  CV: RRR, good S1/S2, no murmur Resp: CTABL, no wheezes, non-labored Ext: No edema, warm Neuro: Alert and oriented, No gross deficits  Assessment and plan:  #Muscle spasm Of the back Long-standing intermittent symptoms Change to baclofen from Zanaflex  # aphthous ulcer of the mouth Most likely, refill xylocaine  # osteoporosis Pt has tried boniva and fosamax, she did not tolerate either. She would like prolia.    Meds ordered this encounter  Medications  . lidocaine (XYLOCAINE) 2 % solution    Sig: SWISH AND SPIT ENOUGH TO FILL MOUTH DAILY AS NEEDED FOR MOUTH PAIN    Dispense:  100 mL    Refill:  2  . SUMAtriptan (IMITREX) 100 MG tablet    Sig: TAKE 1 TABLET BY MOUTH AT ONSET OF HEADACHE. MAY REPEAT IN 2 HOURS IF HEADACHE PERSISTS OR RECURS.LIMIT 2 PER 24 HOURS    Dispense:  10 tablet    Refill:  11  . methocarbamol (ROBAXIN) 500 MG tablet    Sig: Take 1 tablet (500 mg total) by mouth every 8 (eight) hours as needed for muscle spasms.    Dispense:  30 tablet    Refill:  [place], MD [place] 12/15/2017, 3:27 PM

## 2017-12-15 NOTE — Telephone Encounter (Signed)
Summary of benefits received. Patient would not owe anything for Prolia if purchased as buy and bill through [employer]. Left a message on her home phone to give me a call back to discuss starting Prolia.

## 2017-12-15 NOTE — Patient Instructions (Signed)
Great to see you!  We will work on getting prolia approved for you, a nurse will contact to you to receive the injection when it is arranged.

## 2017-12-23 ENCOUNTER — Other Ambulatory Visit: Payer: Self-pay | Admitting: Family Medicine

## 2017-12-23 ENCOUNTER — Other Ambulatory Visit (HOSPITAL_COMMUNITY): Payer: Self-pay

## 2017-12-23 DIAGNOSIS — Z79899 Other long term (current) drug therapy: Secondary | ICD-10-CM | POA: Diagnosis not present

## 2017-12-23 DIAGNOSIS — M5136 Other intervertebral disc degeneration, lumbar region: Secondary | ICD-10-CM | POA: Diagnosis not present

## 2017-12-23 DIAGNOSIS — G894 Chronic pain syndrome: Secondary | ICD-10-CM | POA: Diagnosis not present

## 2017-12-23 MED ORDER — LORAZEPAM 1 MG PO TABS: ORAL_TABLET | ORAL | 0 refills | Status: DC

## 2017-12-24 ENCOUNTER — Ambulatory Visit (HOSPITAL_COMMUNITY)
Admission: RE | Admit: 2017-12-24 | Discharge: 2017-12-24 | Disposition: A | Discharge status: Home / Self Care | Payer: Medicare Other | Source: Ambulatory Visit | Attending: Pulmonary Disease | Admitting: Pulmonary Disease

## 2017-12-24 DIAGNOSIS — I2510 Atherosclerotic heart disease of native coronary artery without angina pectoris: Secondary | ICD-10-CM | POA: Insufficient documentation

## 2017-12-24 DIAGNOSIS — R0602 Shortness of breath: Secondary | ICD-10-CM | POA: Insufficient documentation

## 2017-12-24 DIAGNOSIS — D3502 Benign neoplasm of left adrenal gland: Secondary | ICD-10-CM | POA: Insufficient documentation

## 2017-12-24 DIAGNOSIS — K760 Fatty (change of) liver, not elsewhere classified: Secondary | ICD-10-CM | POA: Insufficient documentation

## 2017-12-24 DIAGNOSIS — I700 Atherosclerosis of aorta: Secondary | ICD-10-CM | POA: Diagnosis not present

## 2017-12-24 DIAGNOSIS — R911 Solitary pulmonary nodule: Secondary | ICD-10-CM | POA: Insufficient documentation

## 2017-12-24 DIAGNOSIS — I517 Cardiomegaly: Secondary | ICD-10-CM | POA: Diagnosis not present

## 2017-12-24 DIAGNOSIS — J984 Other disorders of lung: Secondary | ICD-10-CM | POA: Diagnosis not present

## 2017-12-29 ENCOUNTER — Telehealth: Payer: Self-pay | Admitting: Pulmonary Disease

## 2017-12-29 ENCOUNTER — Telehealth: Payer: Self-pay | Admitting: Family Medicine

## 2017-12-29 NOTE — Telephone Encounter (Signed)
Spoke to patient on phone.  She is on Statin and Tricor.  Has PMH DM2. We discussed normal LFTs. I reviewed the CT result which denoted severe FLD.   I recommended that she schedule an appt w/ GI for further evaluation.

## 2017-12-29 NOTE — Telephone Encounter (Signed)
Called and spoke with patient, advised her of BQ result note. Patient verbalized understanding. Nothing further needed.

## 2018-01-04 ENCOUNTER — Telehealth: Payer: Self-pay | Admitting: Family Medicine

## 2018-01-04 ENCOUNTER — Other Ambulatory Visit (HOSPITAL_COMMUNITY): Payer: Self-pay | Admitting: Psychiatry

## 2018-01-04 NOTE — Telephone Encounter (Signed)
States that this will be a NEW start - aware Amber Stephenson / Amber Stephenson will call pt tomorrow 01/05/18.  She will have to leave her house at 12:30 on 8/6  Please call before that if possible.

## 2018-01-05 ENCOUNTER — Other Ambulatory Visit: Payer: Self-pay | Admitting: Family Medicine

## 2018-01-05 NOTE — Telephone Encounter (Signed)
Spoke with patient today about starting Prolia.  Appt scheduled from 01/07/18. Will order from [facility]. Patient is buy and bill.

## 2018-01-05 NOTE — Telephone Encounter (Signed)
Last lipid 03/17/16

## 2018-01-05 NOTE — Telephone Encounter (Signed)
Had attempted to reach patient on 12/15/17 but she was not available. Spoke with her today about her benefit coverage and appointment scheduled.

## 2018-01-07 ENCOUNTER — Ambulatory Visit: Payer: Self-pay | Admitting: Cardiovascular Disease

## 2018-01-07 ENCOUNTER — Ambulatory Visit (INDEPENDENT_AMBULATORY_CARE_PROVIDER_SITE_OTHER): Payer: Medicare Other | Admitting: *Deleted

## 2018-01-07 DIAGNOSIS — M810 Age-related osteoporosis without current pathological fracture: Secondary | ICD-10-CM

## 2018-01-07 MED ORDER — DENOSUMAB 60 MG/ML ~~LOC~~ SOSY
60.0000 mg | PREFILLED_SYRINGE | Freq: Once | SUBCUTANEOUS | Status: AC
  Administered 2018-01-07: 60 mg via SUBCUTANEOUS

## 2018-01-07 NOTE — Progress Notes (Signed)
Pt given Prolia inj Tolerated well Buy and bill 

## 2018-01-07 NOTE — Patient Instructions (Signed)
Denosumab injection What is this medicine? DENOSUMAB (den oh sue mab) slows bone breakdown. Prolia is used to treat osteoporosis in women after menopause and in men. Delton See is used to treat a high calcium level due to cancer and to prevent bone fractures and other bone problems caused by multiple myeloma or cancer bone metastases. Delton See is also used to treat giant cell tumor of the bone. This medicine may be used for other purposes; ask your health care provider or pharmacist if you have questions. COMMON BRAND NAME(S): Prolia, XGEVA What should I tell my health care provider before I take this medicine? They need to know if you have any of these conditions: -dental disease -having surgery or tooth extraction -infection -kidney disease -low levels of calcium or Vitamin D in the blood -malnutrition -on hemodialysis -skin conditions or sensitivity -thyroid or parathyroid disease -an unusual reaction to denosumab, other medicines, foods, dyes, or preservatives -pregnant or trying to get pregnant -breast-feeding How should I use this medicine? This medicine is for injection under the skin. It is given by a health care professional in a hospital or clinic setting. If you are getting Prolia, a special MedGuide will be given to you by the pharmacist with each prescription and refill. Be sure to read this information carefully each time. For Prolia, talk to your pediatrician regarding the use of this medicine in children. Special care may be needed. For Delton See, talk to your pediatrician regarding the use of this medicine in children. While this drug may be prescribed for children as young as 13 years for selected conditions, precautions do apply. Overdosage: If you think you have taken too much of this medicine contact a poison control center or emergency room at once. NOTE: This medicine is only for you. Do not share this medicine with others. What if I miss a dose? It is important not to miss your  dose. Call your doctor or health care professional if you are unable to keep an appointment. What may interact with this medicine? Do not take this medicine with any of the following medications: -other medicines containing denosumab This medicine may also interact with the following medications: -medicines that lower your chance of fighting infection -steroid medicines like prednisone or cortisone This list may not describe all possible interactions. Give your health care provider a list of all the medicines, herbs, non-prescription drugs, or dietary supplements you use. Also tell them if you smoke, drink alcohol, or use illegal drugs. Some items may interact with your medicine. What should I watch for while using this medicine? Visit your doctor or health care professional for regular checks on your progress. Your doctor or health care professional may order blood tests and other tests to see how you are doing. Call your doctor or health care professional for advice if you get a fever, chills or sore throat, or other symptoms of a cold or flu. Do not treat yourself. This drug may decrease your body's ability to fight infection. Try to avoid being around people who are sick. You should make sure you get enough calcium and vitamin D while you are taking this medicine, unless your doctor tells you not to. Discuss the foods you eat and the vitamins you take with your health care professional. See your dentist regularly. Brush and floss your teeth as directed. Before you have any dental work done, tell your dentist you are receiving this medicine. Do not become pregnant while taking this medicine or for 5 months after stopping  it. Talk with your doctor or health care professional about your birth control options while taking this medicine. Women should inform their doctor if they wish to become pregnant or think they might be pregnant. There is a potential for serious side effects to an unborn child. Talk  to your health care professional or pharmacist for more information. What side effects may I notice from receiving this medicine? Side effects that you should report to your doctor or health care professional as soon as possible: -allergic reactions like skin rash, itching or hives, swelling of the face, lips, or tongue -bone pain -breathing problems -dizziness -jaw pain, especially after dental work -redness, blistering, peeling of the skin -signs and symptoms of infection like fever or chills; cough; sore throat; pain or trouble passing urine -signs of low calcium like fast heartbeat, muscle cramps or muscle pain; pain, tingling, numbness in the hands or feet; seizures -unusual bleeding or bruising -unusually weak or tired Side effects that usually do not require medical attention (report to your doctor or health care professional if they continue or are bothersome): -constipation -diarrhea -headache -joint pain -loss of appetite -muscle pain -runny nose -tiredness -upset stomach This list may not describe all possible side effects. Call your doctor for medical advice about side effects. You may report side effects to FDA at 1-800-FDA-1088. Where should I keep my medicine? This medicine is only given in a clinic, doctor's office, or other health care setting and will not be stored at home. NOTE: This sheet is a summary. It may not cover all possible information. If you have questions about this medicine, talk to your doctor, pharmacist, or health care provider.  2018 Elsevier/Gold Standard (2016-06-10 19:17:21)

## 2018-01-11 ENCOUNTER — Telehealth: Payer: Self-pay | Admitting: Family Medicine

## 2018-01-12 ENCOUNTER — Encounter: Payer: Self-pay | Admitting: Family Medicine

## 2018-01-12 ENCOUNTER — Ambulatory Visit (INDEPENDENT_AMBULATORY_CARE_PROVIDER_SITE_OTHER): Payer: Medicare Other | Admitting: Family Medicine

## 2018-01-12 VITALS — BP 108/68 | HR 66 | Temp 97.1°F | Ht 62.0 in | Wt 209.2 lb

## 2018-01-12 DIAGNOSIS — K760 Fatty (change of) liver, not elsewhere classified: Secondary | ICD-10-CM

## 2018-01-12 DIAGNOSIS — I517 Cardiomegaly: Secondary | ICD-10-CM | POA: Diagnosis not present

## 2018-01-12 DIAGNOSIS — J4520 Mild intermittent asthma, uncomplicated: Secondary | ICD-10-CM

## 2018-01-12 HISTORY — DX: Fatty (change of) liver, not elsewhere classified: K76.0

## 2018-01-12 HISTORY — DX: Mild intermittent asthma, uncomplicated: J45.20

## 2018-01-12 HISTORY — DX: Cardiomegaly: I51.7

## 2018-01-12 NOTE — Progress Notes (Signed)
Subjective: CC: discuss CT results PCP: Amber Norlander, DO Amber Stephenson is a 72 y.o. female presenting to clinic today for:  1. Abnormal CT scan We reviewed CT scan obtained by patient's pulmonologist today.  Findings were significant for severe fatty liver disease and cardiomegaly.  Patient has known atrial fibrillation, CHF.  She sees Dr Bronson Ing for this.  She has an upcoming appointment with him.  Pertaining to her fatty liver disease, she is currently on a statin and TriCor.  She does have known diabetes.  No history of alcohol use disorder.  She does have a gastroenterologist but has not yet scheduled an appointment for follow-up.  No yellowing of skin or eyes.  No abdominal pain, nausea or vomiting.   ROS: Per HPI  Allergies  Allergen Reactions  . Ivp Dye [Iodinated Diagnostic Agents] Anaphylaxis and Swelling    Throat closes   Past Medical History:  Diagnosis Date  . Atrial fibrillation ([place])   . Bipolar affective ([place])   . CHF (congestive heart failure) ([place])   . Chronic back pain   . Diabetes mellitus without complication ([place])   . Hyperlipidemia   . Hypertension   . Pain management   . Renal insufficiency     Current Outpatient Medications:  .  apixaban (ELIQUIS) 5 MG TABS tablet, Take 1 tablet (5 mg total) by mouth 2 (two) times daily., Disp: 180 tablet, Rfl: 0 .  ARIPiprazole (ABILIFY) 10 MG tablet, TAKE 3 TABLETS EVERY MORNING (Patient taking differently: Take 30 mg by mouth daily. ), Disp: 270 tablet, Rfl: 2 .  atorvastatin (LIPITOR) 40 MG tablet, TAKE 1 TABLET DAILY, Disp: 90 tablet, Rfl: 0 .  Blood Glucose Monitoring Suppl (FREESTYLE LITE) DEVI, Use to check blood sugar tid. DX E11.22, Disp: 1 each, Rfl: 0 .  carbamazepine (CARBATROL) 100 MG 12 hr capsule, Take 1 capsule (100 mg total) by mouth 2 (two) times daily., Disp: 180 capsule, Rfl: 6 .  Cholecalciferol (VITAMIN D3) 1000 UNITS CAPS, Take 1,000 Units by mouth daily., Disp: , Rfl:  .   clotrimazole (LOTRIMIN) 1 % cream, APPLY EXTERNALLY TO THE AFFECTED AREA TWICE DAILY, Disp: 30 g, Rfl: 2 .  diltiazem (CARDIZEM CD) 120 MG 24 hr capsule, Take 1 capsule (120 mg total) by mouth at bedtime., Disp: 90 capsule, Rfl: 3 .  Dulaglutide (TRULICITY) 1.5 KG/8.1EH SOPN, Inject 1.5 mg into the skin once a week., Disp: 12 pen, Rfl: 3 .  FREESTYLE LITE test strip, USE FOR TESTING SUGAR THREE TIMES DAILY, Disp: 100 each, Rfl: 3 .  furosemide (LASIX) 20 MG tablet, TAKE 1 TABLET TWICE A DAY, Disp: 180 tablet, Rfl: 1 .  Insulin Glargine (LANTUS SOLOSTAR) 100 UNIT/ML Solostar Pen, INJECT 50 UNITS UNDER THE SKIN TWICE A DAY, Disp: 90 mL, Rfl: 0 .  Insulin Pen Needle (SURE COMFORT PEN NEEDLES) 32G X 4 MM MISC, Use with insulin pens as directed. Dx E11.9, Disp: 300 each, Rfl: 11 .  Ipratropium-Albuterol (COMBIVENT RESPIMAT) 20-100 MCG/ACT AERS respimat, Inhale 1 puff into the lungs every 6 (six) hours., Disp: 4 g, Rfl: 2 .  Lancets (FREESTYLE) lancets, Test tid. DX E11.22, Disp: 100 each, Rfl: 5 .  lidocaine (XYLOCAINE) 2 % solution, SWISH AND SPIT ENOUGH TO FILL MOUTH DAILY AS NEEDED FOR MOUTH PAIN, Disp: 100 mL, Rfl: 2 .  LORazepam (ATIVAN) 1 MG tablet, Take 1 tab (1 mg) in the morning and 2 tabs (2 mg ) at bedtime, Disp: 90 tablet, Rfl: 0 .  methocarbamol (ROBAXIN) 500 MG tablet, Take 1 tablet (500 mg total) by mouth every 8 (eight) hours as needed for muscle spasms., Disp: 30 tablet, Rfl: 3 .  metoprolol tartrate (LOPRESSOR) 100 MG tablet, TAKE 1 TABLET TWICE A DAY (Patient taking differently: TAKE 1 TABLET (100 MG) BY MOUTH TWICE DAILY WITH  A 25 MG TABLET FOR A TOTAL DOSE OF 125 MG), Disp: 180 tablet, Rfl: 3 .  metoprolol tartrate (LOPRESSOR) 25 MG tablet, TAKE 1 TABLET TWICE A DAY (TO BE TAKEN ALONG WITH THE LOPRESSOR 100 MG TWICE A DAY), Disp: 180 tablet, Rfl: 0 .  naloxone (NARCAN) nasal spray 4 mg/0.1 mL, Place 1 spray into the nose once as needed (opiod overdose)., Disp: , Rfl:  .   oxyCODONE-acetaminophen (PERCOCET/ROXICET) 5-325 MG tablet, Take 1 tablet by mouth every 8 (eight) hours., Disp: , Rfl:  .  pregabalin (LYRICA) 150 MG capsule, Take 1 capsule (150 mg total) by mouth 2 (two) times daily., Disp: 180 capsule, Rfl: 2 .  rizatriptan (MAXALT-MLT) 10 MG disintegrating tablet, DISSOLVE 1 TABLET UNDER THE TONGUE IMMEDIATELY AT SIGN OF HEADACHE. MAY REPEAT DOSE IN 1 HOUR IF NEEDED. MAX OF 3 TABLETS IN 24 HOURS, Disp: 10 tablet, Rfl: 3 .  SUMAtriptan (IMITREX) 100 MG tablet, TAKE 1 TABLET BY MOUTH AT ONSET OF HEADACHE. MAY REPEAT IN 2 HOURS IF HEADACHE PERSISTS OR RECURS.LIMIT 2 PER 24 HOURS, Disp: 10 tablet, Rfl: 11 .  traZODone (DESYREL) 100 MG tablet, Take 3 tablets (300 mg total) by mouth at bedtime., Disp: 90 tablet, Rfl: 2 .  TRICOR 145 MG tablet, TAKE 1 TABLET DAILY, Disp: 90 tablet, Rfl: 0 Social History   Socioeconomic History  . Marital status: Married    Spouse name: Not on file  . Number of children: 3  . Years of education: 16  . Highest education level: Bachelor's degree (e.g., BA, AB, BS)  Occupational History  . Not on file  Social Needs  . Financial resource strain: Not hard at all  . Food insecurity:    Worry: Never true    Inability: Never true  . Transportation needs:    Medical: No    Non-medical: No  Tobacco Use  . Smoking status: Never Smoker  . Smokeless tobacco: Never Used  Substance and Sexual Activity  . Alcohol use: No    Alcohol/week: 0.0 standard drinks  . Drug use: No  . Sexual activity: Yes    Partners: Male    Birth control/protection: Post-menopausal  Lifestyle  . Physical activity:    Days per week: 0 days    Minutes per session: 0 min  . Stress: Not at all  Relationships  . Social connections:    Talks on phone: More than three times a week    Gets together: Once a week    Attends religious service: More than 4 times per year    Active member of club or organization: No    Attends meetings of clubs or  organizations: Never    Relationship status: Married  . Intimate partner violence:    Fear of current or ex partner: No    Emotionally abused: No    Physically abused: No    Forced sexual activity: No  Other Topics Concern  . Not on file  Social History Narrative   Lives at home with husband.    Family History  Problem Relation Age of Onset  . Diabetes Mother   . Heart disease Mother   . Heart disease  Brother   . Heart disease Sister        CABG  . Alcohol abuse Sister   . Mental illness Brother   . Diabetes Brother     Objective: Office vital signs reviewed. BP 108/68   Pulse 66   Temp (!) 97.1 F (36.2 C) (Oral)   Ht 5\' 2"  (1.575 m)   Wt 209 lb 3.2 oz (94.9 kg)   BMI 38.26 kg/m   Physical Examination:  General: Awake, alert, obese, No acute distress HEENT: sclera white, MMM Cardio: regular rate and rhythm, S1S2 heard, no murmurs appreciated Pulm: clear to auscultation bilaterally, no wheezes, rhonchi or rales; normal work of breathing on room air  Assessment/ Plan: 72 y.o. female   1. NAFL (nonalcoholic fatty liver) Noted on CT.  I encouraged her to schedule an appointment with her gastroenterologist that she has further questions.  Continue current cholesterol medications.  Handout provided today with a low-fat, low-cholesterol diet.  I also encouraged her to visit liver foundations' website for further information.  2. Cardiomegaly Has an appoint with cardiology.  She has known heart disease.  3. Mild intermittent asthma without complication Patient to follow-up with pulmonology as scheduled for routine care.   Amber Norlander, DO [place] [phone]

## 2018-01-12 NOTE — Telephone Encounter (Signed)
Called patient to advise her we didn't order the CT scan and can't fax it Dr. Percell Miller

## 2018-01-12 NOTE — Patient Instructions (Addendum)
Follow up with Dr Bronson Ing for your heart.  Follow up with your gastric specialist with regards to the fatty liver noted on your CT scan.  Here is a good website to help you w/ nutrition: [email or web address]  Keep your appointment with your pulmonologist and endocrinologist.  Nonalcoholic Fatty Liver Disease Diet Nonalcoholic fatty liver disease is a condition that causes fat to accumulate in and around the liver. The disease makes it harder for the liver to work the way that it should. Following a healthy diet can help to keep nonalcoholic fatty liver disease under control. It can also help to prevent or improve conditions that are associated with the disease, such as heart disease, diabetes, high blood pressure, and abnormal cholesterol levels. Along with regular exercise, this diet:  Promotes weight loss.  Helps to control blood sugar levels.  Helps to improve the way that the body uses insulin.  What do I need to know about this diet?  Use the glycemic index (GI) to plan your meals. The index tells you how quickly a food will raise your blood sugar. Choose low-GI foods. These foods take a longer time to raise blood sugar.  Keep track of how many calories you take in. Eating the right amount of calories will help you to achieve a healthy weight.  You may want to follow a Mediterranean diet. This diet includes a lot of vegetables, lean meats or fish, whole grains, fruits, and healthy oils and fats. What foods can I eat? Grains Whole grains, such as whole-wheat or whole-grain breads, crackers, tortillas, cereals, and pasta. Stone-ground whole wheat. Pumpernickel bread. Unsweetened oatmeal. Bulgur. Barley. Quinoa. Brown or wild rice. Corn or whole-wheat flour tortillas. Vegetables Lettuce. Spinach. Peas. Beets. Cauliflower. Cabbage. Broccoli. Carrots. Tomatoes. Squash. Eggplant. Herbs. Peppers.  Onions. Cucumbers. Brussels sprouts. Yams and sweet potatoes. Beans. Lentils. Fruits Bananas. Apples. Oranges. Grapes. Papaya. Mango. Pomegranate. Kiwi. Grapefruit. Cherries. Meats and Other Protein Sources Seafood and shellfish. Lean meats. Poultry. Tofu. Dairy Low-fat or fat-free dairy products, such as yogurt, cottage cheese, and cheese. Beverages Water. Sugar-free drinks. Tea. Coffee. Low-fat or skim milk. Milk alternatives, such as soy or almond milk. Real fruit juice. Condiments Mustard. Relish. Low-fat, low-sugar ketchup and barbecue sauce. Low-fat or fat-free mayonnaise. Sweets and Desserts Sugar-free sweets. Fats and Oils Avocado. Canola or olive oil. Nuts and nut butters. Seeds. The items listed above may not be a complete list of recommended foods or beverages. Contact your dietitian for more options. What foods are not recommended? Palm oil and coconut oil. Processed foods. Fried foods. Sweetened drinks, such as sweet tea, milkshakes, snow cones, iced sweet drinks, and sodas. Alcohol. Sweets. Foods that contain a lot of salt or sodium. The items listed above may not be a complete list of foods and beverages to avoid. Contact your dietitian for more information. This information is not intended to replace advice given to you by your health care provider. Make sure you discuss any questions you have with your health care provider. Document Released: 10/03/2014 Document Revised: 10/25/2015 Document Reviewed: 06/13/2014 Elsevier Interactive Patient Education  [employer].

## 2018-01-13 ENCOUNTER — Other Ambulatory Visit: Payer: Self-pay

## 2018-01-13 ENCOUNTER — Telehealth: Payer: Self-pay | Admitting: Pulmonary Disease

## 2018-01-13 ENCOUNTER — Ambulatory Visit (INDEPENDENT_AMBULATORY_CARE_PROVIDER_SITE_OTHER): Payer: Medicare Other | Admitting: Psychiatry

## 2018-01-13 ENCOUNTER — Encounter (HOSPITAL_COMMUNITY): Payer: Self-pay | Admitting: Psychiatry

## 2018-01-13 ENCOUNTER — Other Ambulatory Visit (HOSPITAL_COMMUNITY): Payer: Self-pay

## 2018-01-13 VITALS — BP 124/76 | HR 74 | Ht 62.0 in | Wt 209.0 lb

## 2018-01-13 DIAGNOSIS — F3162 Bipolar disorder, current episode mixed, moderate: Secondary | ICD-10-CM | POA: Diagnosis not present

## 2018-01-13 MED ORDER — LORAZEPAM 1 MG PO TABS: ORAL_TABLET | ORAL | 0 refills | Status: DC

## 2018-01-13 MED ORDER — TRAZODONE HCL 100 MG PO TABS: 300.0000 mg | ORAL_TABLET | Freq: Every day | ORAL | 2 refills | Status: DC

## 2018-01-13 MED ORDER — RIZATRIPTAN BENZOATE 10 MG PO TBDP: ORAL_TABLET | ORAL | 2 refills | Status: DC

## 2018-01-13 MED ORDER — DOXEPIN HCL 50 MG PO CAPS: 50.0000 mg | ORAL_CAPSULE | Freq: Every day | ORAL | 2 refills | Status: DC

## 2018-01-13 NOTE — Progress Notes (Signed)
Patient ID: Amber Stephenson, female   DOB: 1945-06-24, 72 y.o.   MRN: [id number] [facility] MD Progress Note .she's going to apply 01/13/2018 5:04 PM Amber Stephenson  MRN:  [id number] Subjective:  Doing well Principal Problem: Bipolar disorder most recent episode manic Diagnosis:  Bipolar disorder most recent   Today the patient is seen with her husband. Actually this is about a month earlier than expected. The actually having no new problems. The patient does describe her anxiety level is somewhat higher bullous suspect is related to the fact that her daughter who is recovering out of his living with them now. Apparently does some degree of tension between her and her daughter who she is with every day. Patient also has multiple medical problems.She seen doctors every week having tests. The patient denies depression. She denies any signs of mania. She's not irritable. She is not 4. She does not have racing thinking. Her appetite is normal. She does complain other sleep is not for her. She's having hard time staying asleep. She goes to sleep and takes 3 trazodone. Patient is no evidence of psychosis. She is not grandiose. She's not suicidal. Her husband is stable. They likely home they live in. The patient takes her medicines as prescribed. The patient drinks no alcohol uses no drugs. They're planning a trip to [place]. The patient shows no evidence of tardive dyskinesia. Past Medical History:  Diagnosis Date  . Atrial fibrillation ([place])   . Bipolar affective ([place])   . CHF (congestive heart failure) ([place])   . Chronic back pain   . Diabetes mellitus without complication ([place])   . Hyperlipidemia   . Hypertension   . Pain management   . Renal insufficiency     Past Surgical History:  Procedure Laterality Date  . BREAST REDUCTION SURGERY    . EYE SURGERY Right    cateracts  . HAMMER TOE SURGERY    . SHOULDER SURGERY Right   . THIGH SURGERY     Family History:  Family History  Problem Relation  Age of Onset  . Diabetes Mother   . Heart disease Mother   . Heart disease Brother   . Heart disease Sister        CABG  . Alcohol abuse Sister   . Mental illness Brother   . Diabetes Brother    Family Psychiatric  History:  Social History:  Social History   Substance and Sexual Activity  Alcohol Use No  . Alcohol/week: 0.0 standard drinks     Social History   Substance and Sexual Activity  Drug Use No    Social History   Socioeconomic History  . Marital status: Married    Spouse name: Not on file  . Number of children: 3  . Years of education: 16  . Highest education level: Bachelor's degree (e.g., BA, AB, BS)  Occupational History  . Not on file  Social Needs  . Financial resource strain: Not hard at all  . Food insecurity:    Worry: Never true    Inability: Never true  . Transportation needs:    Medical: No    Non-medical: No  Tobacco Use  . Smoking status: Never Smoker  . Smokeless tobacco: Never Used  Substance and Sexual Activity  . Alcohol use: No    Alcohol/week: 0.0 standard drinks  . Drug use: No  . Sexual activity: Yes    Partners: Male    Birth control/protection: Post-menopausal  Lifestyle  . Physical activity:  Days per week: 0 days    Minutes per session: 0 min  . Stress: Not at all  Relationships  . Social connections:    Talks on phone: More than three times a week    Gets together: Once a week    Attends religious service: More than 4 times per year    Active member of club or organization: No    Attends meetings of clubs or organizations: Never    Relationship status: Married  Other Topics Concern  . Not on file  Social History Narrative   Lives at home with husband.    Additional Social History:                         Sleep: Good  Appetite:  Good  Current Medications: Current Outpatient Medications  Medication Sig Dispense Refill  . apixaban (ELIQUIS) 5 MG TABS tablet Take 1 tablet (5 mg total) by mouth 2  (two) times daily. 180 tablet 0  . ARIPiprazole (ABILIFY) 10 MG tablet TAKE 3 TABLETS EVERY MORNING (Patient taking differently: Take 30 mg by mouth daily. ) 270 tablet 2  . atorvastatin (LIPITOR) 40 MG tablet TAKE 1 TABLET DAILY 90 tablet 0  . Blood Glucose Monitoring Suppl (FREESTYLE LITE) DEVI Use to check blood sugar tid. DX E11.22 1 each 0  . carbamazepine (CARBATROL) 100 MG 12 hr capsule Take 1 capsule (100 mg total) by mouth 2 (two) times daily. 180 capsule 6  . Cholecalciferol (VITAMIN D3) 1000 UNITS CAPS Take 1,000 Units by mouth daily.    . clotrimazole (LOTRIMIN) 1 % cream APPLY EXTERNALLY TO THE AFFECTED AREA TWICE DAILY 30 g 2  . diltiazem (CARDIZEM CD) 120 MG 24 hr capsule Take 1 capsule (120 mg total) by mouth at bedtime. 90 capsule 3  . doxepin (SINEQUAN) 50 MG capsule Take 1 capsule (50 mg total) by mouth at bedtime. 30 capsule 2  . Dulaglutide (TRULICITY) 1.5 ZO/1.0RU SOPN Inject 1.5 mg into the skin once a week. 12 pen 3  . FREESTYLE LITE test strip USE FOR TESTING SUGAR THREE TIMES DAILY 100 each 3  . furosemide (LASIX) 20 MG tablet TAKE 1 TABLET TWICE A DAY 180 tablet 1  . Insulin Glargine (LANTUS SOLOSTAR) 100 UNIT/ML Solostar Pen INJECT 50 UNITS UNDER THE SKIN TWICE A DAY 90 mL 0  . Insulin Pen Needle (SURE COMFORT PEN NEEDLES) 32G X 4 MM MISC Use with insulin pens as directed. Dx E11.9 300 each 11  . Ipratropium-Albuterol (COMBIVENT RESPIMAT) 20-100 MCG/ACT AERS respimat Inhale 1 puff into the lungs every 6 (six) hours. 4 g 2  . Lancets (FREESTYLE) lancets Test tid. DX E11.22 100 each 5  . lidocaine (XYLOCAINE) 2 % solution SWISH AND SPIT ENOUGH TO FILL MOUTH DAILY AS NEEDED FOR MOUTH PAIN 100 mL 2  . LORazepam (ATIVAN) 1 MG tablet Take 1 tab (1 mg) in the morning and 2 tabs (2 mg ) at bedtime 90 tablet 0  . methocarbamol (ROBAXIN) 500 MG tablet Take 1 tablet (500 mg total) by mouth every 8 (eight) hours as needed for muscle spasms. 30 tablet 3  . metoprolol tartrate  (LOPRESSOR) 100 MG tablet TAKE 1 TABLET TWICE A DAY (Patient taking differently: TAKE 1 TABLET (100 MG) BY MOUTH TWICE DAILY WITH  A 25 MG TABLET FOR A TOTAL DOSE OF 125 MG) 180 tablet 3  . metoprolol tartrate (LOPRESSOR) 25 MG tablet TAKE 1 TABLET TWICE A DAY (TO  BE TAKEN ALONG WITH THE LOPRESSOR 100 MG TWICE A DAY) 180 tablet 0  . naloxone (NARCAN) nasal spray 4 mg/0.1 mL Place 1 spray into the nose once as needed (opiod overdose).    Marland Kitchen oxyCODONE-acetaminophen (PERCOCET/ROXICET) 5-325 MG tablet Take 1 tablet by mouth every 8 (eight) hours.    . pregabalin (LYRICA) 150 MG capsule Take 1 capsule (150 mg total) by mouth 2 (two) times daily. 180 capsule 2  . rizatriptan (MAXALT-MLT) 10 MG disintegrating tablet DISSOLVE 1 TABLET UNDER THE TONGUE IMMEDIATELY AT SIGN OF HEADACHE. MAY REPEAT DOSE IN 1 HOUR IF NEEDED. MAX OF 3 TABLETS IN 24 HOURS 10 tablet 2  . SUMAtriptan (IMITREX) 100 MG tablet TAKE 1 TABLET BY MOUTH AT ONSET OF HEADACHE. MAY REPEAT IN 2 HOURS IF HEADACHE PERSISTS OR RECURS.LIMIT 2 PER 24 HOURS 10 tablet 11  . traZODone (DESYREL) 100 MG tablet Take 3 tablets (300 mg total) by mouth at bedtime. 90 tablet 2  . TRICOR 145 MG tablet TAKE 1 TABLET DAILY 90 tablet 0   No current facility-administered medications for this visit.     Lab Results: No results found for this or any previous visit (from the past 48 hour(s)).  Blood Alcohol level:  Lab Results  Component Value Date   ETH <5 10/01/2015    Physical Findings: AIMS:  , ,  ,  ,    CIWA:    COWS:     Musculoskeletal: Strength & Muscle Tone: within normal limits Gait & Station: normal Patient leans: N/A  Psychiatric Specialty Exam: ROS  Blood pressure 124/76, pulse 74, height 5\' 2"  (1.575 m), weight 209 lb (94.8 kg).Body mass index is 38.23 kg/m.  General Appearance: Casual  Eye Contact::  Good  Speech:  Clear and Coherent  Volume:  Normal  Mood:  Euthymic  Affect:  Appropriate  Thought Process:  Coherent   Orientation:  Full (Time, Place, and Person)  Thought Content:  WDL  Suicidal Thoughts:  No  Homicidal Thoughts:  No  Memory:  NA  Judgement:  Good  Insight:  Good  Psychomotor Activity:  Normal  Concentration:  Good  Recall:  Good  Fund of Knowledge:Good  Language: Good  Akathisia:  No  Handed:  Right  AIMS (if indicated):     Assets: Good spirits   ADL's:  Intact  Cognition: WNL  Sleep:     eks Treatment Plan Summary: This patient's first admission important problem is bipolar disorder. She takes multiple medications for this condition. This includes Tegretol and Abilify. At this time the patient is not in therapy. She'll make an attempt to get a therapist in her community. She's taking a moderate to high dose of Ativan 1 mg one in the morning and 2 at night and requested more of it. At this time we will not increase the dose of Ativan. We will have her response to her second problem which is her problems sleeping. She takes 3 trazodone to sleep and doesn't stay asleep. At this time she'll continue taking the trazodone but she'll begin on doxepin 50 mg in addition. Today we spent more than 50% of the time talking about her medications and educating her. She understands that she cannot drink alcohol with her medications and she'll actually does not. The patient stays busy and active. She does importance of being in therapy and she'll actively looking for a therapist at this time. She'll return to see me in 3 months for a 30 minute visit. At  that time consider getting a Tegretol blood level and other blood work.patient denies any physical complaints of chest pain shortness of breath.

## 2018-01-13 NOTE — Telephone Encounter (Signed)
Spoke with pt. She is requesting for her last CT be faxed to Dr. Percell Miller. This has been taken care of. Nothing further was needed.

## 2018-01-15 ENCOUNTER — Other Ambulatory Visit (HOSPITAL_COMMUNITY): Payer: Self-pay | Admitting: Psychiatry

## 2018-01-15 ENCOUNTER — Other Ambulatory Visit (HOSPITAL_COMMUNITY): Payer: Self-pay

## 2018-01-15 MED ORDER — DOXEPIN HCL 50 MG PO CAPS: 50.0000 mg | ORAL_CAPSULE | Freq: Every day | ORAL | 2 refills | Status: DC

## 2018-01-15 MED ORDER — LORAZEPAM 1 MG PO TABS: ORAL_TABLET | ORAL | 0 refills | Status: DC

## 2018-01-18 ENCOUNTER — Other Ambulatory Visit: Payer: Self-pay

## 2018-01-18 ENCOUNTER — Other Ambulatory Visit (HOSPITAL_COMMUNITY): Payer: Self-pay

## 2018-01-18 ENCOUNTER — Encounter: Payer: Self-pay | Admitting: Neurology

## 2018-01-18 ENCOUNTER — Ambulatory Visit (INDEPENDENT_AMBULATORY_CARE_PROVIDER_SITE_OTHER): Payer: Medicare Other | Admitting: Neurology

## 2018-01-18 VITALS — BP 116/70 | HR 82 | Ht 62.0 in | Wt 209.0 lb

## 2018-01-18 DIAGNOSIS — R413 Other amnesia: Secondary | ICD-10-CM

## 2018-01-18 NOTE — Patient Instructions (Signed)
Wishing you well with your lung doctor. Follow-up in 6-8 months, call for any changes.   RECOMMENDATIONS FOR ALL PATIENTS WITH MEMORY PROBLEMS: 1. Continue to exercise (Recommend 30 minutes of walking everyday, or 3 hours every week) 2. Increase social interactions - continue going to [place] and enjoy social gatherings with friends and family 3. Eat healthy, avoid fried foods and eat more fruits and vegetables 4. Maintain adequate blood pressure, blood sugar, and blood cholesterol level. Reducing the risk of stroke and cardiovascular disease also helps promoting better memory. 5. Avoid stressful situations. Live a simple life and avoid aggravations. Organize your time and prepare for the next day in anticipation. 6. Sleep well, avoid any interruptions of sleep and avoid any distractions in the bedroom that may interfere with adequate sleep quality 7. Avoid sugar, avoid sweets as there is a strong link between excessive sugar intake, diabetes, and cognitive impairment We discussed the Mediterranean diet, which has been shown to help patients reduce the risk of progressive memory disorders and reduces cardiovascular risk. This includes eating fish, eat fruits and green leafy vegetables, nuts like almonds and hazelnuts, walnuts, and also use olive oil. Avoid fast foods and fried foods as much as possible. Avoid sweets and sugar as sugar use has been linked to worsening of memory function.

## 2018-01-18 NOTE — Progress Notes (Addendum)
NEUROLOGY FOLLOW UP OFFICE NOTE  Amber Stephenson [id number] 03-21-1946  HISTORY OF PRESENT ILLNESS: I had the pleasure of seeing Amber Stephenson in follow-up in the neurology clinic on 01/18/2018.  The patient was last seen 6 months ago for memory changes. MRI brain no acute changes. MMSE normal 29/30. We discussed different causes of memory change. TSH and B12 normal. We discussed how poor sleep and anxiety/depression/stress can cause cognitive changes. She continues to work with her psychiatrist Dr. Casimiro Needle and reports sleep is better, she is now getting 4 hours of sleep, and that mood is better. She feels her memory is better, she remembers things better. She does not drive. Her husband manages finances and medications. She does not feel well due to shortness of breath, she tells me there is a "spot" in her lungs and she has more tests scheduled. She denies any dizziness, diplopia, no falls.   History on Initial Assessment 07/17/2017: This is a 72 year old right-handed woman with a history of hypertension, hyperlipidemia, diabetes, atrial fibrillation, chronic pain, bipolar disorder, presenting for evaluation of memory changes. She feels her memory has gotten really bad over the past 2 months. She cannot remember words/word-finding difficulties. She "does not think it's Alzheimers" but feels like she is not paying attention. Family reminds her that they had told her something already previously. Her husband has to remind her of her medications. They put her pills together in a pillbox. Her husband is in charge of finances. She stopped driving 4 years ago, she denied getting lost when she used to drive. She started noticing memory changes after she was started on a medication after she had her colonoscopy. She has also been dealing with a lot of pain, she states her Percocet dose was cut in half a month ago and this is causing her a lot of issues. She has poor sleep, 3 hours at the most. She was recently  started on Trazodone by her psychiatrist, which may be helping. Her father had Alzheimer's disease. She denies any history of significant head injuries, no alcohol use.  She has frequent headaches, around 20 headache days a month with stabbing pain in the frontal region that can last up to 3 days, with associated nausea/vomiting. If she takes medication, pain lasts up to 2 hours. She has dizziness at least 1-2 times a day where it feels like the room is spinning. She has had falls for "no clear reason," last fall was 3 months ago. She has chronic neck and back pain, no focal numbness/tingling/weakness. She has tremors in both hands and has a diagnosis of essential tremor, it is "very difficult to do things," it has affected her handwriting, computer use. No family history of tremors. No bowel/bladder dysfunction or anosmia.   I personally reviewed MRI brain without contrast done 05/13/17 which did not show any acute changes. There was mild diffuse atrophy and mild to moderate chronic microvascular disease.  PAST MEDICAL HISTORY: Past Medical History:  Diagnosis Date  . Atrial fibrillation ([place])   . Bipolar affective ([place])   . CHF (congestive heart failure) ([place])   . Chronic back pain   . Diabetes mellitus without complication ([place])   . Hyperlipidemia   . Hypertension   . Pain management   . Renal insufficiency     MEDICATIONS: Current Outpatient Medications on File Prior to Visit  Medication Sig Dispense Refill  . apixaban (ELIQUIS) 5 MG TABS tablet Take 1 tablet (5 mg total) by mouth 2 (two)  times daily. 180 tablet 0  . ARIPiprazole (ABILIFY) 10 MG tablet TAKE 3 TABLETS EVERY MORNING (Patient taking differently: Take 30 mg by mouth daily. ) 270 tablet 2  . atorvastatin (LIPITOR) 40 MG tablet TAKE 1 TABLET DAILY 90 tablet 0  . Blood Glucose Monitoring Suppl (FREESTYLE LITE) DEVI Use to check blood sugar tid. DX E11.22 1 each 0  . carbamazepine (CARBATROL) 100 MG 12 hr capsule Take 1  capsule (100 mg total) by mouth 2 (two) times daily. 180 capsule 6  . Cholecalciferol (VITAMIN D3) 1000 UNITS CAPS Take 1,000 Units by mouth daily.    . clotrimazole (LOTRIMIN) 1 % cream APPLY EXTERNALLY TO THE AFFECTED AREA TWICE DAILY 30 g 2  . diltiazem (CARDIZEM CD) 120 MG 24 hr capsule Take 1 capsule (120 mg total) by mouth at bedtime. 90 capsule 3  . doxepin (SINEQUAN) 50 MG capsule Take 1 capsule (50 mg total) by mouth at bedtime. 30 capsule 2  . Dulaglutide (TRULICITY) 1.5 AC/1.6SA SOPN Inject 1.5 mg into the skin once a week. 12 pen 3  . FREESTYLE LITE test strip USE FOR TESTING SUGAR THREE TIMES DAILY 100 each 3  . furosemide (LASIX) 20 MG tablet TAKE 1 TABLET TWICE A DAY 180 tablet 1  . Insulin Glargine (LANTUS SOLOSTAR) 100 UNIT/ML Solostar Pen INJECT 50 UNITS UNDER THE SKIN TWICE A DAY 90 mL 0  . Insulin Pen Needle (SURE COMFORT PEN NEEDLES) 32G X 4 MM MISC Use with insulin pens as directed. Dx E11.9 300 each 11  . Ipratropium-Albuterol (COMBIVENT RESPIMAT) 20-100 MCG/ACT AERS respimat Inhale 1 puff into the lungs every 6 (six) hours. 4 g 2  . Lancets (FREESTYLE) lancets Test tid. DX E11.22 100 each 5  . lidocaine (XYLOCAINE) 2 % solution SWISH AND SPIT ENOUGH TO FILL MOUTH DAILY AS NEEDED FOR MOUTH PAIN 100 mL 2  . LORazepam (ATIVAN) 1 MG tablet Take 1 tab (1 mg) in the morning and 2 tabs (2 mg ) at bedtime 90 tablet 0  . methocarbamol (ROBAXIN) 500 MG tablet Take 1 tablet (500 mg total) by mouth every 8 (eight) hours as needed for muscle spasms. 30 tablet 3  . metoprolol tartrate (LOPRESSOR) 100 MG tablet TAKE 1 TABLET TWICE A DAY (Patient taking differently: TAKE 1 TABLET (100 MG) BY MOUTH TWICE DAILY WITH  A 25 MG TABLET FOR A TOTAL DOSE OF 125 MG) 180 tablet 3  . metoprolol tartrate (LOPRESSOR) 25 MG tablet TAKE 1 TABLET TWICE A DAY (TO BE TAKEN ALONG WITH THE LOPRESSOR 100 MG TWICE A DAY) 180 tablet 0  . naloxone (NARCAN) nasal spray 4 mg/0.1 mL Place 1 spray into the nose once as  needed (opiod overdose).    Marland Kitchen oxyCODONE-acetaminophen (PERCOCET/ROXICET) 5-325 MG tablet Take 1 tablet by mouth every 8 (eight) hours.    . pregabalin (LYRICA) 150 MG capsule Take 1 capsule (150 mg total) by mouth 2 (two) times daily. 180 capsule 2  . rizatriptan (MAXALT-MLT) 10 MG disintegrating tablet DISSOLVE 1 TABLET UNDER THE TONGUE IMMEDIATELY AT SIGN OF HEADACHE. MAY REPEAT DOSE IN 1 HOUR IF NEEDED. MAX OF 3 TABLETS IN 24 HOURS 10 tablet 2  . SUMAtriptan (IMITREX) 100 MG tablet TAKE 1 TABLET BY MOUTH AT ONSET OF HEADACHE. MAY REPEAT IN 2 HOURS IF HEADACHE PERSISTS OR RECURS.LIMIT 2 PER 24 HOURS 10 tablet 11  . traZODone (DESYREL) 100 MG tablet Take 3 tablets (300 mg total) by mouth at bedtime. 90 tablet 2  .  TRICOR 145 MG tablet TAKE 1 TABLET DAILY 90 tablet 0   No current facility-administered medications on file prior to visit.     ALLERGIES: Allergies  Allergen Reactions  . Ivp Dye [Iodinated Diagnostic Agents] Anaphylaxis and Swelling    Throat closes    FAMILY HISTORY: Family History  Problem Relation Age of Onset  . Diabetes Mother   . Heart disease Mother   . Heart disease Brother   . Heart disease Sister        CABG  . Alcohol abuse Sister   . Mental illness Brother   . Diabetes Brother     SOCIAL HISTORY: Social History   Socioeconomic History  . Marital status: Married    Spouse name: Not on file  . Number of children: 3  . Years of education: 16  . Highest education level: Bachelor's degree (e.g., BA, AB, BS)  Occupational History  . Not on file  Social Needs  . Financial resource strain: Not hard at all  . Food insecurity:    Worry: Never true    Inability: Never true  . Transportation needs:    Medical: No    Non-medical: No  Tobacco Use  . Smoking status: Never Smoker  . Smokeless tobacco: Never Used  Substance and Sexual Activity  . Alcohol use: No    Alcohol/week: 0.0 standard drinks  . Drug use: No  . Sexual activity: Yes    Partners:  Male    Birth control/protection: Post-menopausal  Lifestyle  . Physical activity:    Days per week: 0 days    Minutes per session: 0 min  . Stress: Not at all  Relationships  . Social connections:    Talks on phone: More than three times a week    Gets together: Once a week    Attends religious service: More than 4 times per year    Active member of club or organization: No    Attends meetings of clubs or organizations: Never    Relationship status: Married  . Intimate partner violence:    Fear of current or ex partner: No    Emotionally abused: No    Physically abused: No    Forced sexual activity: No  Other Topics Concern  . Not on file  Social History Narrative   Lives at home with husband.     REVIEW OF SYSTEMS: Constitutional: No fevers, chills, or sweats, no generalized fatigue, change in appetite Eyes: No visual changes, double vision, eye pain Ear, nose and throat: No hearing loss, ear pain, nasal congestion, sore throat Cardiovascular: No chest pain, palpitations Respiratory:  No shortness of breath at rest or with exertion, wheezes GastrointestinaI: No nausea, vomiting, diarrhea, abdominal pain, fecal incontinence Genitourinary:  No dysuria, urinary retention or frequency Musculoskeletal:  + neck pain, back pain Integumentary: No rash, pruritus, skin lesions Neurological: as above Psychiatric: No depression, insomnia, anxiety Endocrine: No palpitations, fatigue, diaphoresis, mood swings, change in appetite, change in weight, increased thirst Hematologic/Lymphatic:  No anemia, purpura, petechiae. Allergic/Immunologic: no itchy/runny eyes, nasal congestion, recent allergic reactions, rashes  PHYSICAL EXAM: Vitals:   01/18/18 1558  BP: 116/70  Pulse: 82  SpO2: 90%   General: No acute distress Head:  Normocephalic/atraumatic Neck: supple, no paraspinal tenderness, full range of motion Heart:  Regular rate and rhythm Lungs:  Clear to auscultation  bilaterally Back: No paraspinal tenderness Skin/Extremities: No rash, no edema Neurological Exam: alert and oriented to person, place, and time. No aphasia or dysarthria.  Fund of knowledge is appropriate.  Recent and remote memory are intact.  Attention and concentration are normal.    Able to name objects and repeat phrases. CDT 4/5. Able to name 10 F words in 1 minute (nl > 11). MMSE - Mini Mental State Exam 01/18/2018 09/22/2017 04/29/2017  Orientation to time 5 5 5   Orientation to Place 5 5 4   Registration 3 3 3   Attention/ Calculation 3 5 5   Recall 3 3 3   Language- name 2 objects 2 2 2   Language- repeat 1 1 1   Language- follow 3 step command 3 3 3   Language- read & follow direction 1 1 1   Write a sentence 1 1 1   Copy design 1 1 1   Total score 28 30 29    Cranial nerves: Pupils equal, round, reactive to light. Extraocular movements intact with no nystagmus. Visual fields full. Facial sensation intact. No facial asymmetry. Tongue, uvula, palate midline.  Motor: Bulk and tone normal, muscle strength 5/5 throughout with no pronator drift.  Sensation to light touch intact.  No extinction to double simultaneous stimulation.  Deep tendon reflexes 2+ throughout, toes downgoing.  Finger to nose testing intact.  Gait narrow-based and steady, able to tandem walk adequately.  Romberg negative.  IMPRESSION: This is a 72 yo RH woman with a history of hypertension, hyperlipidemia, diabetes, atrial fibrillation, chronic pain, bipolar disorder, who presented for memory changes. Her neurological exam is non-focal, MMSE today 28/30 (29/30 in February 2019). She is feeling memory has been better with improvement in mood and sleep. She is on several psychoactive medications which can cause cognitive changes, continue to monitor and streamline if possible. No indication to start cholinesterase inhibitors at this time. We discussed doing Neurocognitive testing in the future if symptoms change. We again discussed the  importance of control of vascular risk factors, physical exercise, and brain stimulation exercises for brain health. She will follow-up in 6-8 months for re-evaluation and knows to call for any changes  Thank you for allowing me to participate in her care.  Please do not hesitate to call for any questions or concerns.  The duration of this appointment visit was 30 minutes of face-to-face time with the patient.  Greater than 50% of this time was spent in counseling, explanation of diagnosis, planning of further management, and coordination of care.   Ellouise Newer, M.D.   CC: Dr. Lajuana Ripple, Dr. Casimiro Needle

## 2018-01-19 ENCOUNTER — Other Ambulatory Visit (HOSPITAL_COMMUNITY): Payer: Self-pay | Admitting: Psychiatry

## 2018-01-20 ENCOUNTER — Ambulatory Visit (INDEPENDENT_AMBULATORY_CARE_PROVIDER_SITE_OTHER): Payer: Medicare Other | Admitting: Pulmonary Disease

## 2018-01-20 ENCOUNTER — Ambulatory Visit (INDEPENDENT_AMBULATORY_CARE_PROVIDER_SITE_OTHER): Payer: Medicare Other | Admitting: Cardiovascular Disease

## 2018-01-20 ENCOUNTER — Encounter: Payer: Self-pay | Admitting: Pulmonary Disease

## 2018-01-20 ENCOUNTER — Encounter: Payer: Self-pay | Admitting: Cardiovascular Disease

## 2018-01-20 ENCOUNTER — Other Ambulatory Visit (INDEPENDENT_AMBULATORY_CARE_PROVIDER_SITE_OTHER): Payer: Medicare Other

## 2018-01-20 VITALS — BP 116/78 | HR 80 | Ht 61.0 in | Wt 206.0 lb

## 2018-01-20 VITALS — BP 119/81 | HR 89 | Ht 62.0 in | Wt 208.0 lb

## 2018-01-20 DIAGNOSIS — R0602 Shortness of breath: Secondary | ICD-10-CM

## 2018-01-20 DIAGNOSIS — I10 Essential (primary) hypertension: Secondary | ICD-10-CM

## 2018-01-20 DIAGNOSIS — E782 Mixed hyperlipidemia: Secondary | ICD-10-CM

## 2018-01-20 DIAGNOSIS — J849 Interstitial pulmonary disease, unspecified: Secondary | ICD-10-CM | POA: Diagnosis not present

## 2018-01-20 DIAGNOSIS — I2510 Atherosclerotic heart disease of native coronary artery without angina pectoris: Secondary | ICD-10-CM

## 2018-01-20 DIAGNOSIS — I482 Chronic atrial fibrillation: Secondary | ICD-10-CM | POA: Diagnosis not present

## 2018-01-20 DIAGNOSIS — R531 Weakness: Secondary | ICD-10-CM | POA: Diagnosis not present

## 2018-01-20 DIAGNOSIS — I5032 Chronic diastolic (congestive) heart failure: Secondary | ICD-10-CM | POA: Diagnosis not present

## 2018-01-20 DIAGNOSIS — I4821 Permanent atrial fibrillation: Secondary | ICD-10-CM

## 2018-01-20 LAB — PULMONARY FUNCTION TEST
DL/VA % pred: 125 %
DL/VA: 5.51 ml/min/mmHg/L
DLCO cor % pred: 70 %
DLCO cor: 14.18 ml/min/mmHg
DLCO unc % pred: 69 %
DLCO unc: 14.09 ml/min/mmHg
FEF 25-75 Pre: 0.95 L/sec
FEF2575-%Pred-Pre: 55 %
FEV1-%Pred-Pre: 45 %
FEV1-Pre: 0.9 L
FEV1FVC-%Pred-Pre: 108 %
FEV6-%Pred-Pre: 43 %
FEV6-Pre: 1.09 L
FEV6FVC-%Pred-Pre: 105 %
FVC-%Pred-Pre: 41 %
FVC-Pre: 1.09 L
Pre FEV1/FVC ratio: 83 %
Pre FEV6/FVC Ratio: 100 %
RV % pred: 84 %
RV: 1.74 L
TLC % pred: 78 %
TLC: 3.59 L

## 2018-01-20 LAB — SEDIMENTATION RATE: Sed Rate: 24 mm/h (ref 0–30)

## 2018-01-20 MED ORDER — BUDESONIDE-FORMOTEROL FUMARATE 80-4.5 MCG/ACT IN AERO: 2.0000 | INHALATION_SPRAY | Freq: Two times a day (BID) | RESPIRATORY_TRACT | 0 refills | Status: DC

## 2018-01-20 NOTE — Progress Notes (Signed)
PFT completed 01/20/18. Clayborne Dana, CMA performed spirometry. Alethia Berthold, RN completed DLCO and pleth. Per Dr. Lake Bells the post spirometry was not necessary.

## 2018-01-20 NOTE — Progress Notes (Signed)
Synopsis: Referred in July 2019 for evaluation of shortness of breath, had a presumptive diagnosis of asthma   Subjective:   PATIENT ID: Amber Stephenson GENDER: female DOB: 02-Nov-1945, MRN: [id number]   HPI  Chief Complaint  Patient presents with  . Follow-up    Asthma. Pt had PFT today. Pt continues to have SOB with any exertion, wheezing often and slight cough-non productive.    Amber Stephenson returns today complaining of continued significant dyspnea which is prominent while walking or laying flat.  She says that she does not have much problem with cough right now, minimal mucus production.  She is not taking Symbicort right now.  She says that she has not been sick since the last visit.  Her husband accompanies her today.  No recent leg swelling.  Her weight has gone up by about 8 pounds in the last year.  She says that the dyspnea has been present for 7 months.  She says that she had no preceding hospitalization, severe illness or travel.  She has been compliant with anticoagulation for atrial fibrillation for 9 years now.  Her husband says that he does not witnessed snoring or witnessed apneas.  She says that she was told that she had sleep apnea on a sleep study approximately 11 years ago but she has been unable to tolerate CPAP because of severe claustrophobia.  Past Medical History:  Diagnosis Date  . Atrial fibrillation ([place])   . Bipolar affective ([place])   . CHF (congestive heart failure) ([place])   . Chronic back pain   . Diabetes mellitus without complication ([place])   . Hyperlipidemia   . Hypertension   . Pain management   . Renal insufficiency       Review of Systems  Constitutional: Negative for chills, fever, malaise/fatigue and weight loss.  HENT: Negative for congestion, nosebleeds, sinus pain and sore throat.   Eyes: Negative for photophobia, pain and discharge.  Respiratory: Positive for cough, sputum production and shortness of breath. Negative for hemoptysis and wheezing.    Cardiovascular: Negative for chest pain, palpitations, orthopnea and leg swelling.  Gastrointestinal: Negative for abdominal pain, constipation, diarrhea, nausea and vomiting.  Genitourinary: Negative for dysuria, frequency, hematuria and urgency.  Musculoskeletal: Negative for back pain, joint pain, myalgias and neck pain.  Skin: Negative for itching and rash.  Neurological: Negative for tingling, tremors, sensory change, speech change, focal weakness, seizures, weakness and headaches.  Psychiatric/Behavioral: Negative for memory loss, substance abuse and suicidal ideas. The patient is not nervous/anxious.       Objective:  Physical Exam   Vitals:   01/20/18 1420 01/20/18 1459  BP:  116/78  Pulse: 80 80  SpO2: 96% 96%  Weight: 206 lb (93.4 kg) 206 lb (93.4 kg)  Height: 5\' 1"  (1.549 m) 5\' 1"  (1.549 m)   RA  Gen: chronically ill appearing HENT: OP clear, TM's clear, neck supple PULM: Some crackles bases otherwise clear B, normal percussion CV: RRR, no mgr, trace edema GI: BS+, soft, nontender Derm: no cyanosis or rash Psyche: normal mood and affect    CBC    Component Value Date/Time   WBC 7.3 10/16/2017 1600   RBC 4.46 10/16/2017 1600   HGB 13.2 10/16/2017 1600   HGB 14.2 05/27/2017 1319   HCT 39.8 10/16/2017 1600   HCT 43.3 05/27/2017 1319   PLT 208 10/16/2017 1600   PLT 332 05/27/2017 1319   MCV 89.2 10/16/2017 1600   MCV 90 05/27/2017 1319   MCH 29.6  10/16/2017 1600   MCHC 33.2 10/16/2017 1600   RDW 13.1 10/16/2017 1600   RDW 14.7 05/27/2017 1319   LYMPHSABS 2.0 10/16/2017 1600   LYMPHSABS 4.7 (H) 05/27/2017 1319   MONOABS 0.5 10/16/2017 1600   EOSABS 0.1 10/16/2017 1600   EOSABS 0.1 05/27/2017 1319   BASOSABS 0.0 10/16/2017 1600   BASOSABS 0.0 05/27/2017 1319     Chest imaging: May 2019 chest x-ray showed some basilar scarring versus atelectasis  PFT: August 2019 ratio normal, FVC 1.09 L 41% predicted, total lung capacity 3.59 L 78% predicted,  DLCO 14.1 mL 69% predicted  Labs:  Path:  Echo: September 2016 echocardiogram LVEF 55 to 60%, left atrium severely dilated, trivial mitral regurgitation, PA systolic pressure 25 mmHg Heart Catheterization:  Records from her visit with her primary care physician in June 2019 reviewed where she was seen for possible asthma and treated with Symbicort.  Records from her visit with Amber Stephenson in May 2019 reviewed where she was seen and treated for A. fib and congestive heart failure.  The     Assessment & Plan:   No diagnosis found.  Discussion: Amber Stephenson continues to struggle with dyspnea.  I think that obesity and deconditioning are significant because of her shortness of breath though I still think that her dyspnea is disproportionate to the multitude of abnormalities we have discovered.  There is some nonspecific inflammatory versus scarring change in the base of her lungs but that majority of her lung tissue is healthy in appearance and today's lung function test was surprisingly normal.  While she does have a reduction of her diffusion capacity and mild restrictive disease probably due to obesity, she complains of severity of dyspnea which is disproportionate to these abnormalities.  I think we need to get a know a note to look to see if she desaturates at night and then reconsider a sleep study.  I agree completely with the plans for a right heart catheterization given the increased PA diameter seen on the CT scan.  Because of generalized weakness and the mild restriction with a low expiratory reserve volume I think it is worthwhile to have a neurologist see her for generalized weakness to make sure were not dealing with a neuromuscular problem.  She may have asthma, I have encouraged her to resume Symbicort today.  However, today's lung function testing did not show severe airflow obstruction so I do not feel that that is the predominant cause of her symptoms.  Plan: Asthma: Resume  Symbicort 2 puffs twice a day no matter how you feel Use albuterol as needed for chest tightness wheezing or shortness of breath  Restrictive lung disease: Because of generalized weakness you have been experiencing I am going to refer you to neurology to make sure there is not a neuromuscular cause  Interstitial lung disease: There are mild abnormalities seen on your CT scan of the lungs, but these do not appear severe enough to cause the dyspnea We we will check blood work today to make sure there is not evidence of an underlying connective tissue disease which explains this problem The only way to know for certain what is causing this would be to perform a biopsy but I think that that would not be helpful considering how normal your lung function tests were.  Suggestion of pulmonary hypertension based on the increased caliber of the pulmonary artery on CT scan: I agree completely with plans from cardiology to perform a right heart catheterization.  History of  obstructive sleep apnea: For now we will check an oxygen level when you are sleeping, if this is abnormal we may want to repeat a sleep study.  We will see you back in about 3 weeks or sooner if needed    Current Outpatient Medications:  .  apixaban (ELIQUIS) 5 MG TABS tablet, Take 1 tablet (5 mg total) by mouth 2 (two) times daily., Disp: 180 tablet, Rfl: 0 .  ARIPiprazole (ABILIFY) 10 MG tablet, TAKE 3 TABLETS EVERY MORNING (Patient taking differently: Take 30 mg by mouth daily. ), Disp: 270 tablet, Rfl: 2 .  atorvastatin (LIPITOR) 40 MG tablet, TAKE 1 TABLET DAILY, Disp: 90 tablet, Rfl: 0 .  Blood Glucose Monitoring Suppl (FREESTYLE LITE) DEVI, Use to check blood sugar tid. DX E11.22, Disp: 1 each, Rfl: 0 .  carbamazepine (CARBATROL) 100 MG 12 hr capsule, Take 1 capsule (100 mg total) by mouth 2 (two) times daily., Disp: 180 capsule, Rfl: 6 .  Cholecalciferol (VITAMIN D3) 1000 UNITS CAPS, Take 1,000 Units by mouth daily., Disp: ,  Rfl:  .  clotrimazole (LOTRIMIN) 1 % cream, APPLY EXTERNALLY TO THE AFFECTED AREA TWICE DAILY, Disp: 30 g, Rfl: 2 .  diltiazem (CARDIZEM CD) 120 MG 24 hr capsule, Take 1 capsule (120 mg total) by mouth at bedtime., Disp: 90 capsule, Rfl: 3 .  doxepin (SINEQUAN) 50 MG capsule, Take 1 capsule (50 mg total) by mouth at bedtime., Disp: 30 capsule, Rfl: 2 .  Dulaglutide (TRULICITY) 1.5 UX/3.2GM SOPN, Inject 1.5 mg into the skin once a week., Disp: 12 pen, Rfl: 3 .  FREESTYLE LITE test strip, USE FOR TESTING SUGAR THREE TIMES DAILY, Disp: 100 each, Rfl: 3 .  furosemide (LASIX) 20 MG tablet, TAKE 1 TABLET TWICE A DAY, Disp: 180 tablet, Rfl: 1 .  Insulin Glargine (LANTUS SOLOSTAR) 100 UNIT/ML Solostar Pen, INJECT 50 UNITS UNDER THE SKIN TWICE A DAY, Disp: 90 mL, Rfl: 0 .  Insulin Pen Needle (SURE COMFORT PEN NEEDLES) 32G X 4 MM MISC, Use with insulin pens as directed. Dx E11.9, Disp: 300 each, Rfl: 11 .  Lancets (FREESTYLE) lancets, Test tid. DX E11.22, Disp: 100 each, Rfl: 5 .  lidocaine (XYLOCAINE) 2 % solution, SWISH AND SPIT ENOUGH TO FILL MOUTH DAILY AS NEEDED FOR MOUTH PAIN, Disp: 100 mL, Rfl: 2 .  LORazepam (ATIVAN) 1 MG tablet, Take 1 tab (1 mg) in the morning and 2 tabs (2 mg ) at bedtime, Disp: 90 tablet, Rfl: 0 .  methocarbamol (ROBAXIN) 500 MG tablet, Take 1 tablet (500 mg total) by mouth every 8 (eight) hours as needed for muscle spasms., Disp: 30 tablet, Rfl: 3 .  metoprolol tartrate (LOPRESSOR) 100 MG tablet, TAKE 1 TABLET TWICE A DAY (Patient taking differently: TAKE 1 TABLET (100 MG) BY MOUTH TWICE DAILY WITH  A 25 MG TABLET FOR A TOTAL DOSE OF 125 MG), Disp: 180 tablet, Rfl: 3 .  metoprolol tartrate (LOPRESSOR) 25 MG tablet, TAKE 1 TABLET TWICE A DAY (TO BE TAKEN ALONG WITH THE LOPRESSOR 100 MG TWICE A DAY), Disp: 180 tablet, Rfl: 0 .  naloxone (NARCAN) nasal spray 4 mg/0.1 mL, Place 1 spray into the nose once as needed (opiod overdose)., Disp: , Rfl:  .  oxybutynin (DITROPAN-XL) 5 MG 24 hr  tablet, , Disp: , Rfl:  .  oxyCODONE-acetaminophen (PERCOCET/ROXICET) 5-325 MG tablet, Take 1 tablet by mouth every 8 (eight) hours., Disp: , Rfl:  .  pregabalin (LYRICA) 150 MG capsule, Take 1 capsule (150 mg  total) by mouth 2 (two) times daily., Disp: 180 capsule, Rfl: 2 .  rizatriptan (MAXALT-MLT) 10 MG disintegrating tablet, DISSOLVE 1 TABLET UNDER THE TONGUE IMMEDIATELY AT SIGN OF HEADACHE. MAY REPEAT DOSE IN 1 HOUR IF NEEDED. MAX OF 3 TABLETS IN 24 HOURS, Disp: 10 tablet, Rfl: 2 .  SUMAtriptan (IMITREX) 100 MG tablet, TAKE 1 TABLET BY MOUTH AT ONSET OF HEADACHE. MAY REPEAT IN 2 HOURS IF HEADACHE PERSISTS OR RECURS.LIMIT 2 PER 24 HOURS, Disp: 10 tablet, Rfl: 11 .  traZODone (DESYREL) 100 MG tablet, Take 3 tablets (300 mg total) by mouth at bedtime., Disp: 90 tablet, Rfl: 2 .  TRICOR 145 MG tablet, TAKE 1 TABLET DAILY, Disp: 90 tablet, Rfl: 0 .  Ipratropium-Albuterol (COMBIVENT RESPIMAT) 20-100 MCG/ACT AERS respimat, Inhale 1 puff into the lungs every 6 (six) hours. (Patient not taking: Reported on 01/20/2018), Disp: 4 g, Rfl: 2

## 2018-01-20 NOTE — Progress Notes (Signed)
SUBJECTIVE: The patient presents for follow-up of permanent atrial fibrillation and chronic diastolic heart failure.  She underwent a low risk nuclear stress test on 06/20/2015.  She saw pulmonary who ordered a high-resolution chest CT performed on 12/24/2017 which showed mild to moderate cardiomegaly, dilated main pulmonary artery, left main coronary atherosclerosis, mild to moderate patchy air trapping in both lungs indicative of small airways disease, and minimal patchy subpleural reticulation and groundglass attenuation in the dependent lower lobes.  Possibility of interstitial lung disease was mention.  She is scheduled to see pulmonary later today and she is scheduled to have pulmonary function testing as well.  She denies chest pain.  She has had increasing shortness of breath.  Weight is actually down 2 pounds since her last visit with me in May.  She takes Lasix 20 mg twice daily.  Last echocardiogram demonstrated normal left ventricular systolic function in September 2016.   Review of Systems: As per "subjective", otherwise negative.  Allergies  Allergen Reactions  . Ivp Dye [Iodinated Diagnostic Agents] Anaphylaxis and Swelling    Throat closes    Current Outpatient Medications  Medication Sig Dispense Refill  . apixaban (ELIQUIS) 5 MG TABS tablet Take 1 tablet (5 mg total) by mouth 2 (two) times daily. 180 tablet 0  . ARIPiprazole (ABILIFY) 10 MG tablet TAKE 3 TABLETS EVERY MORNING (Patient taking differently: Take 30 mg by mouth daily. ) 270 tablet 2  . atorvastatin (LIPITOR) 40 MG tablet TAKE 1 TABLET DAILY 90 tablet 0  . Blood Glucose Monitoring Suppl (FREESTYLE LITE) DEVI Use to check blood sugar tid. DX E11.22 1 each 0  . carbamazepine (CARBATROL) 100 MG 12 hr capsule Take 1 capsule (100 mg total) by mouth 2 (two) times daily. 180 capsule 6  . Cholecalciferol (VITAMIN D3) 1000 UNITS CAPS Take 1,000 Units by mouth daily.    . clotrimazole (LOTRIMIN) 1 % cream APPLY  EXTERNALLY TO THE AFFECTED AREA TWICE DAILY 30 g 2  . diltiazem (CARDIZEM CD) 120 MG 24 hr capsule Take 1 capsule (120 mg total) by mouth at bedtime. 90 capsule 3  . doxepin (SINEQUAN) 50 MG capsule Take 1 capsule (50 mg total) by mouth at bedtime. 30 capsule 2  . Dulaglutide (TRULICITY) 1.5 CW/2.3JS SOPN Inject 1.5 mg into the skin once a week. 12 pen 3  . FREESTYLE LITE test strip USE FOR TESTING SUGAR THREE TIMES DAILY 100 each 3  . furosemide (LASIX) 20 MG tablet TAKE 1 TABLET TWICE A DAY 180 tablet 1  . Insulin Glargine (LANTUS SOLOSTAR) 100 UNIT/ML Solostar Pen INJECT 50 UNITS UNDER THE SKIN TWICE A DAY 90 mL 0  . Insulin Pen Needle (SURE COMFORT PEN NEEDLES) 32G X 4 MM MISC Use with insulin pens as directed. Dx E11.9 300 each 11  . Ipratropium-Albuterol (COMBIVENT RESPIMAT) 20-100 MCG/ACT AERS respimat Inhale 1 puff into the lungs every 6 (six) hours. 4 g 2  . Lancets (FREESTYLE) lancets Test tid. DX E11.22 100 each 5  . lidocaine (XYLOCAINE) 2 % solution SWISH AND SPIT ENOUGH TO FILL MOUTH DAILY AS NEEDED FOR MOUTH PAIN 100 mL 2  . LORazepam (ATIVAN) 1 MG tablet Take 1 tab (1 mg) in the morning and 2 tabs (2 mg ) at bedtime 90 tablet 0  . methocarbamol (ROBAXIN) 500 MG tablet Take 1 tablet (500 mg total) by mouth every 8 (eight) hours as needed for muscle spasms. 30 tablet 3  . metoprolol tartrate (LOPRESSOR) 100 MG  tablet TAKE 1 TABLET TWICE A DAY (Patient taking differently: TAKE 1 TABLET (100 MG) BY MOUTH TWICE DAILY WITH  A 25 MG TABLET FOR A TOTAL DOSE OF 125 MG) 180 tablet 3  . metoprolol tartrate (LOPRESSOR) 25 MG tablet TAKE 1 TABLET TWICE A DAY (TO BE TAKEN ALONG WITH THE LOPRESSOR 100 MG TWICE A DAY) 180 tablet 0  . naloxone (NARCAN) nasal spray 4 mg/0.1 mL Place 1 spray into the nose once as needed (opiod overdose).    Marland Kitchen oxybutynin (DITROPAN-XL) 5 MG 24 hr tablet     . oxyCODONE-acetaminophen (PERCOCET/ROXICET) 5-325 MG tablet Take 1 tablet by mouth every 8 (eight) hours.    .  pregabalin (LYRICA) 150 MG capsule Take 1 capsule (150 mg total) by mouth 2 (two) times daily. 180 capsule 2  . rizatriptan (MAXALT-MLT) 10 MG disintegrating tablet DISSOLVE 1 TABLET UNDER THE TONGUE IMMEDIATELY AT SIGN OF HEADACHE. MAY REPEAT DOSE IN 1 HOUR IF NEEDED. MAX OF 3 TABLETS IN 24 HOURS 10 tablet 2  . SUMAtriptan (IMITREX) 100 MG tablet TAKE 1 TABLET BY MOUTH AT ONSET OF HEADACHE. MAY REPEAT IN 2 HOURS IF HEADACHE PERSISTS OR RECURS.LIMIT 2 PER 24 HOURS 10 tablet 11  . traZODone (DESYREL) 100 MG tablet Take 3 tablets (300 mg total) by mouth at bedtime. 90 tablet 2  . TRICOR 145 MG tablet TAKE 1 TABLET DAILY 90 tablet 0   No current facility-administered medications for this visit.     Past Medical History:  Diagnosis Date  . Atrial fibrillation ([place])   . Bipolar affective ([place])   . CHF (congestive heart failure) ([place])   . Chronic back pain   . Diabetes mellitus without complication ([place])   . Hyperlipidemia   . Hypertension   . Pain management   . Renal insufficiency     Past Surgical History:  Procedure Laterality Date  . BREAST REDUCTION SURGERY    . EYE SURGERY Right    cateracts  . HAMMER TOE SURGERY    . SHOULDER SURGERY Right   . THIGH SURGERY      Social History   Socioeconomic History  . Marital status: Married    Spouse name: Not on file  . Number of children: 3  . Years of education: 16  . Highest education level: Bachelor's degree (e.g., BA, AB, BS)  Occupational History  . Not on file  Social Needs  . Financial resource strain: Not hard at all  . Food insecurity:    Worry: Never true    Inability: Never true  . Transportation needs:    Medical: No    Non-medical: No  Tobacco Use  . Smoking status: Never Smoker  . Smokeless tobacco: Never Used  Substance and Sexual Activity  . Alcohol use: No    Alcohol/week: 0.0 standard drinks  . Drug use: No  . Sexual activity: Yes    Partners: Male    Birth control/protection: Post-menopausal    Lifestyle  . Physical activity:    Days per week: 0 days    Minutes per session: 0 min  . Stress: Not at all  Relationships  . Social connections:    Talks on phone: More than three times a week    Gets together: Once a week    Attends religious service: More than 4 times per year    Active member of club or organization: No    Attends meetings of clubs or organizations: Never    Relationship status: Married  .  Intimate partner violence:    Fear of current or ex partner: No    Emotionally abused: No    Physically abused: No    Forced sexual activity: No  Other Topics Concern  . Not on file  Social History Narrative   Lives at home with husband.      Vitals:   01/20/18 1026  BP: 119/81  Pulse: 89  SpO2: 90%  Weight: 208 lb (94.3 kg)  Height: 5\' 2"  (1.575 m)    Wt Readings from Last 3 Encounters:  01/20/18 208 lb (94.3 kg)  01/18/18 209 lb (94.8 kg)  01/12/18 209 lb 3.2 oz (94.9 kg)     PHYSICAL EXAM General: NAD HEENT: Normal. Neck: No JVD, no thyromegaly. Lungs: Diminished breath sounds, no obvious crackles or wheezes. CV: Regular rate and irregular rhythm, normal S1/S2, no S3, no murmur. No pretibial or periankle edema.   Abdomen: Soft, nontender, no distention.  Neurologic: Alert and oriented.  Psych: Normal affect. Skin: Normal. Musculoskeletal: No gross deformities.    ECG: Reviewed above under Subjective   Labs: Lab Results  Component Value Date/Time   K 3.7 10/16/2017 04:00 PM   BUN 19 10/16/2017 04:00 PM   BUN 26 05/27/2017 01:19 PM   CREATININE 0.90 10/16/2017 04:00 PM   ALT 17 10/16/2017 04:00 PM   TSH 1.922 05/19/2017 01:18 PM   TSH 3.550 01/04/2015 10:28 AM   HGB 13.2 10/16/2017 04:00 PM   HGB 14.2 05/27/2017 01:19 PM     Lipids: Lab Results  Component Value Date/Time   LDLCALC 71 01/04/2015 10:28 AM   LDLDIRECT 62 03/17/2016 04:14 PM   CHOL 163 01/04/2015 10:28 AM   TRIG 249 (H) 01/04/2015 10:28 AM   HDL 42 01/04/2015 10:28  AM       ASSESSMENT AND PLAN:  1. Permanent atrial fibrillation:Symptomatically stable. Continuelong acting diltiazem 120 mg dailyandmetoprolol 125 mg twice daily. Anticoagulated withapixaban.  2. Chronic diastolic heart failure:She appears euvolemic.  Weight is down 2 pounds since her last visit.Currently takes Lasix 20 mg twice daily. I told her she can take Lasix as needed for increased swelling, dyspnea, and weight gain.  She had mild to moderate cardiomegaly on high-resolution chest CT as noted above.  LV size was normal in September 2016. I will order a 2-D echocardiogram with Doppler to evaluate for interval changes in cardiac structure, function, and regional wall motion.  3. Hypertension: Controlled. No changes.  4. Hyperlipidemia: Continue Lipitor 40 mg.  5.  Exertional dyspnea and fatigue with coronary atherosclerosis: She underwent a low risk nuclear stress test in January 2017.  Heart rate with respect to atrial fibrillation is controlled.  She has multiple cardiovascular risk factors including insulin-dependent diabetes mellitus.  She is unable to get a coronary CT due to atrial fibrillation. I would consider obtaining a chest x-ray today but she is scheduled to see pulmonary this afternoon who has also ordered pulmonary function testing.  I will defer this to Dr. Lake Bells. Given the cardiomegaly seen on high-resolution chest CT, I will obtain a follow-up echocardiogram to assess for interval changes in cardiac structure and function. I think she would benefit from right and left heart catheterization and coronary angiography given her symptoms and left main coronary artery atherosclerosis on chest CT.  I do not think she can lie flat at the present time for this.  I will arrange for this in the near future.  Disposition: Follow up 6 weeks  Time spent: 40 minutes, of  which greater than 50% was spent reviewing symptoms, relevant blood tests and studies, and discussing  management plan with the patient.    Kate Sable, M.D., F.A.C.C.

## 2018-01-20 NOTE — Patient Instructions (Signed)
Medication Instructions:  Your physician recommends that you continue on your current medications as directed. Please refer to the Current Medication list given to you today.   Labwork: none  Testing/Procedures: Your physician has requested that you have an echocardiogram. Echocardiography is a painless test that uses sound waves to create images of your heart. It provides your doctor with information about the size and shape of your heart and how well your heart's chambers and valves are working. This procedure takes approximately one hour. There are no restrictions for this procedure.     Follow-Up: Your physician recommends that you schedule a follow-up appointment in: 6 weeks    Any Other Special Instructions Will Be Listed Below (If Applicable).     If you need a refill on your cardiac medications before your next appointment, please call your pharmacy.   

## 2018-01-20 NOTE — Patient Instructions (Signed)
Asthma: Resume Symbicort 2 puffs twice a day no matter how you feel Use albuterol as needed for chest tightness wheezing or shortness of breath  Restrictive lung disease: Because of generalized weakness you have been experiencing I am going to refer you to neurology to make sure there is not a neuromuscular cause  Interstitial lung disease: There are mild abnormalities seen on your CT scan of the lungs, but these do not appear severe enough to cause the dyspnea We we will check blood work today to make sure there is not evidence of an underlying connective tissue disease which explains this problem The only way to know for certain what is causing this would be to perform a biopsy but I think that that would not be helpful considering how normal your lung function tests were.  Suggestion of pulmonary hypertension based on the increased caliber of the pulmonary artery on CT scan: I agree completely with plans from cardiology to perform a right heart catheterization.  History of obstructive sleep apnea: For now we will check an oxygen level when you are sleeping, if this is abnormal we may want to repeat a sleep study.  We will see you back in about 3 weeks or sooner if needed

## 2018-01-20 NOTE — H&P (View-Only) (Signed)
SUBJECTIVE: The patient presents for follow-up of permanent atrial fibrillation and chronic diastolic heart failure.  She underwent a low risk nuclear stress test on 06/20/2015.  She saw pulmonary who ordered a high-resolution chest CT performed on 12/24/2017 which showed mild to moderate cardiomegaly, dilated main pulmonary artery, left main coronary atherosclerosis, mild to moderate patchy air trapping in both lungs indicative of small airways disease, and minimal patchy subpleural reticulation and groundglass attenuation in the dependent lower lobes.  Possibility of interstitial lung disease was mention.  She is scheduled to see pulmonary later today and she is scheduled to have pulmonary function testing as well.  She denies chest pain.  She has had increasing shortness of breath.  Weight is actually down 2 pounds since her last visit with me in May.  She takes Lasix 20 mg twice daily.  Last echocardiogram demonstrated normal left ventricular systolic function in September 2016.   Review of Systems: As per "subjective", otherwise negative.  Allergies  Allergen Reactions  . Ivp Dye [Iodinated Diagnostic Agents] Anaphylaxis and Swelling    Throat closes    Current Outpatient Medications  Medication Sig Dispense Refill  . apixaban (ELIQUIS) 5 MG TABS tablet Take 1 tablet (5 mg total) by mouth 2 (two) times daily. 180 tablet 0  . ARIPiprazole (ABILIFY) 10 MG tablet TAKE 3 TABLETS EVERY MORNING (Patient taking differently: Take 30 mg by mouth daily. ) 270 tablet 2  . atorvastatin (LIPITOR) 40 MG tablet TAKE 1 TABLET DAILY 90 tablet 0  . Blood Glucose Monitoring Suppl (FREESTYLE LITE) DEVI Use to check blood sugar tid. DX E11.22 1 each 0  . carbamazepine (CARBATROL) 100 MG 12 hr capsule Take 1 capsule (100 mg total) by mouth 2 (two) times daily. 180 capsule 6  . Cholecalciferol (VITAMIN D3) 1000 UNITS CAPS Take 1,000 Units by mouth daily.    . clotrimazole (LOTRIMIN) 1 % cream APPLY  EXTERNALLY TO THE AFFECTED AREA TWICE DAILY 30 g 2  . diltiazem (CARDIZEM CD) 120 MG 24 hr capsule Take 1 capsule (120 mg total) by mouth at bedtime. 90 capsule 3  . doxepin (SINEQUAN) 50 MG capsule Take 1 capsule (50 mg total) by mouth at bedtime. 30 capsule 2  . Dulaglutide (TRULICITY) 1.5 NW/2.9FA SOPN Inject 1.5 mg into the skin once a week. 12 pen 3  . FREESTYLE LITE test strip USE FOR TESTING SUGAR THREE TIMES DAILY 100 each 3  . furosemide (LASIX) 20 MG tablet TAKE 1 TABLET TWICE A DAY 180 tablet 1  . Insulin Glargine (LANTUS SOLOSTAR) 100 UNIT/ML Solostar Pen INJECT 50 UNITS UNDER THE SKIN TWICE A DAY 90 mL 0  . Insulin Pen Needle (SURE COMFORT PEN NEEDLES) 32G X 4 MM MISC Use with insulin pens as directed. Dx E11.9 300 each 11  . Ipratropium-Albuterol (COMBIVENT RESPIMAT) 20-100 MCG/ACT AERS respimat Inhale 1 puff into the lungs every 6 (six) hours. 4 g 2  . Lancets (FREESTYLE) lancets Test tid. DX E11.22 100 each 5  . lidocaine (XYLOCAINE) 2 % solution SWISH AND SPIT ENOUGH TO FILL MOUTH DAILY AS NEEDED FOR MOUTH PAIN 100 mL 2  . LORazepam (ATIVAN) 1 MG tablet Take 1 tab (1 mg) in the morning and 2 tabs (2 mg ) at bedtime 90 tablet 0  . methocarbamol (ROBAXIN) 500 MG tablet Take 1 tablet (500 mg total) by mouth every 8 (eight) hours as needed for muscle spasms. 30 tablet 3  . metoprolol tartrate (LOPRESSOR) 100 MG  tablet TAKE 1 TABLET TWICE A DAY (Patient taking differently: TAKE 1 TABLET (100 MG) BY MOUTH TWICE DAILY WITH  A 25 MG TABLET FOR A TOTAL DOSE OF 125 MG) 180 tablet 3  . metoprolol tartrate (LOPRESSOR) 25 MG tablet TAKE 1 TABLET TWICE A DAY (TO BE TAKEN ALONG WITH THE LOPRESSOR 100 MG TWICE A DAY) 180 tablet 0  . naloxone (NARCAN) nasal spray 4 mg/0.1 mL Place 1 spray into the nose once as needed (opiod overdose).    Marland Kitchen oxybutynin (DITROPAN-XL) 5 MG 24 hr tablet     . oxyCODONE-acetaminophen (PERCOCET/ROXICET) 5-325 MG tablet Take 1 tablet by mouth every 8 (eight) hours.    .  pregabalin (LYRICA) 150 MG capsule Take 1 capsule (150 mg total) by mouth 2 (two) times daily. 180 capsule 2  . rizatriptan (MAXALT-MLT) 10 MG disintegrating tablet DISSOLVE 1 TABLET UNDER THE TONGUE IMMEDIATELY AT SIGN OF HEADACHE. MAY REPEAT DOSE IN 1 HOUR IF NEEDED. MAX OF 3 TABLETS IN 24 HOURS 10 tablet 2  . SUMAtriptan (IMITREX) 100 MG tablet TAKE 1 TABLET BY MOUTH AT ONSET OF HEADACHE. MAY REPEAT IN 2 HOURS IF HEADACHE PERSISTS OR RECURS.LIMIT 2 PER 24 HOURS 10 tablet 11  . traZODone (DESYREL) 100 MG tablet Take 3 tablets (300 mg total) by mouth at bedtime. 90 tablet 2  . TRICOR 145 MG tablet TAKE 1 TABLET DAILY 90 tablet 0   No current facility-administered medications for this visit.     Past Medical History:  Diagnosis Date  . Atrial fibrillation ([place])   . Bipolar affective ([place])   . CHF (congestive heart failure) ([place])   . Chronic back pain   . Diabetes mellitus without complication ([place])   . Hyperlipidemia   . Hypertension   . Pain management   . Renal insufficiency     Past Surgical History:  Procedure Laterality Date  . BREAST REDUCTION SURGERY    . EYE SURGERY Right    cateracts  . HAMMER TOE SURGERY    . SHOULDER SURGERY Right   . THIGH SURGERY      Social History   Socioeconomic History  . Marital status: Married    Spouse name: Not on file  . Number of children: 3  . Years of education: 16  . Highest education level: Bachelor's degree (e.g., BA, AB, BS)  Occupational History  . Not on file  Social Needs  . Financial resource strain: Not hard at all  . Food insecurity:    Worry: Never true    Inability: Never true  . Transportation needs:    Medical: No    Non-medical: No  Tobacco Use  . Smoking status: Never Smoker  . Smokeless tobacco: Never Used  Substance and Sexual Activity  . Alcohol use: No    Alcohol/week: 0.0 standard drinks  . Drug use: No  . Sexual activity: Yes    Partners: Male    Birth control/protection: Post-menopausal    Lifestyle  . Physical activity:    Days per week: 0 days    Minutes per session: 0 min  . Stress: Not at all  Relationships  . Social connections:    Talks on phone: More than three times a week    Gets together: Once a week    Attends religious service: More than 4 times per year    Active member of club or organization: No    Attends meetings of clubs or organizations: Never    Relationship status: Married  .  Intimate partner violence:    Fear of current or ex partner: No    Emotionally abused: No    Physically abused: No    Forced sexual activity: No  Other Topics Concern  . Not on file  Social History Narrative   Lives at home with husband.      Vitals:   01/20/18 1026  BP: 119/81  Pulse: 89  SpO2: 90%  Weight: 208 lb (94.3 kg)  Height: 5\' 2"  (1.575 m)    Wt Readings from Last 3 Encounters:  01/20/18 208 lb (94.3 kg)  01/18/18 209 lb (94.8 kg)  01/12/18 209 lb 3.2 oz (94.9 kg)     PHYSICAL EXAM General: NAD HEENT: Normal. Neck: No JVD, no thyromegaly. Lungs: Diminished breath sounds, no obvious crackles or wheezes. CV: Regular rate and irregular rhythm, normal S1/S2, no S3, no murmur. No pretibial or periankle edema.   Abdomen: Soft, nontender, no distention.  Neurologic: Alert and oriented.  Psych: Normal affect. Skin: Normal. Musculoskeletal: No gross deformities.    ECG: Reviewed above under Subjective   Labs: Lab Results  Component Value Date/Time   K 3.7 10/16/2017 04:00 PM   BUN 19 10/16/2017 04:00 PM   BUN 26 05/27/2017 01:19 PM   CREATININE 0.90 10/16/2017 04:00 PM   ALT 17 10/16/2017 04:00 PM   TSH 1.922 05/19/2017 01:18 PM   TSH 3.550 01/04/2015 10:28 AM   HGB 13.2 10/16/2017 04:00 PM   HGB 14.2 05/27/2017 01:19 PM     Lipids: Lab Results  Component Value Date/Time   LDLCALC 71 01/04/2015 10:28 AM   LDLDIRECT 62 03/17/2016 04:14 PM   CHOL 163 01/04/2015 10:28 AM   TRIG 249 (H) 01/04/2015 10:28 AM   HDL 42 01/04/2015 10:28  AM       ASSESSMENT AND PLAN:  1. Permanent atrial fibrillation:Symptomatically stable. Continuelong acting diltiazem 120 mg dailyandmetoprolol 125 mg twice daily. Anticoagulated withapixaban.  2. Chronic diastolic heart failure:She appears euvolemic.  Weight is down 2 pounds since her last visit.Currently takes Lasix 20 mg twice daily. I told her she can take Lasix as needed for increased swelling, dyspnea, and weight gain.  She had mild to moderate cardiomegaly on high-resolution chest CT as noted above.  LV size was normal in September 2016. I will order a 2-D echocardiogram with Doppler to evaluate for interval changes in cardiac structure, function, and regional wall motion.  3. Hypertension: Controlled. No changes.  4. Hyperlipidemia: Continue Lipitor 40 mg.  5.  Exertional dyspnea and fatigue with coronary atherosclerosis: She underwent a low risk nuclear stress test in January 2017.  Heart rate with respect to atrial fibrillation is controlled.  She has multiple cardiovascular risk factors including insulin-dependent diabetes mellitus.  She is unable to get a coronary CT due to atrial fibrillation. I would consider obtaining a chest x-ray today but she is scheduled to see pulmonary this afternoon who has also ordered pulmonary function testing.  I will defer this to Dr. Lake Bells. Given the cardiomegaly seen on high-resolution chest CT, I will obtain a follow-up echocardiogram to assess for interval changes in cardiac structure and function. I think she would benefit from right and left heart catheterization and coronary angiography given her symptoms and left main coronary artery atherosclerosis on chest CT.  I do not think she can lie flat at the present time for this.  I will arrange for this in the near future.  Disposition: Follow up 6 weeks  Time spent: 40 minutes, of  which greater than 50% was spent reviewing symptoms, relevant blood tests and studies, and discussing  management plan with the patient.    Kate Sable, M.D., F.A.C.C.

## 2018-01-21 LAB — CENTROMERE ANTIBODIES: Centromere Ab Screen: <0.2 AI (ref 0.0–0.9)

## 2018-01-21 LAB — ANTI-JO 1 ANTIBODY, IGG: Anti JO-1: <0.2 AI (ref 0.0–0.9)

## 2018-01-22 DIAGNOSIS — R809 Proteinuria, unspecified: Secondary | ICD-10-CM | POA: Diagnosis not present

## 2018-01-22 DIAGNOSIS — I10 Essential (primary) hypertension: Secondary | ICD-10-CM | POA: Diagnosis not present

## 2018-01-22 DIAGNOSIS — D509 Iron deficiency anemia, unspecified: Secondary | ICD-10-CM | POA: Diagnosis not present

## 2018-01-22 DIAGNOSIS — Z79899 Other long term (current) drug therapy: Secondary | ICD-10-CM | POA: Diagnosis not present

## 2018-01-22 DIAGNOSIS — N183 Chronic kidney disease, stage 3 (moderate): Secondary | ICD-10-CM | POA: Diagnosis not present

## 2018-01-22 DIAGNOSIS — E559 Vitamin D deficiency, unspecified: Secondary | ICD-10-CM | POA: Diagnosis not present

## 2018-01-22 LAB — ANA: Anti Nuclear Antibody(ANA): NEGATIVE

## 2018-01-22 LAB — SJOGRENS SYNDROME-B EXTRACTABLE NUCLEAR ANTIBODY: SSB (La) (ENA) Antibody, IgG: <1 AI

## 2018-01-22 LAB — ANTI-SCLERODERMA ANTIBODY: Scleroderma (Scl-70) (ENA) Antibody, IgG: <1 AI

## 2018-01-22 LAB — SJOGRENS SYNDROME-A EXTRACTABLE NUCLEAR ANTIBODY: SSA (Ro) (ENA) Antibody, IgG: <1 AI

## 2018-01-22 LAB — RHEUMATOID FACTOR: Rhuematoid fact SerPl-aCnc: <14 IU/mL (ref <14)

## 2018-01-22 LAB — CYCLIC CITRUL PEPTIDE ANTIBODY, IGG: Cyclic Citrullin Peptide Ab: <16 UNITS

## 2018-01-22 LAB — ALDOLASE: Aldolase: 4.9 U/L (ref <=8.1)

## 2018-01-25 ENCOUNTER — Other Ambulatory Visit: Payer: Self-pay | Admitting: Cardiovascular Disease

## 2018-01-25 ENCOUNTER — Telehealth: Payer: Self-pay | Admitting: Family Medicine

## 2018-01-25 ENCOUNTER — Other Ambulatory Visit: Payer: Self-pay | Admitting: Family Medicine

## 2018-01-25 ENCOUNTER — Telehealth: Payer: Self-pay

## 2018-01-25 DIAGNOSIS — I2510 Atherosclerotic heart disease of native coronary artery without angina pectoris: Secondary | ICD-10-CM

## 2018-01-25 DIAGNOSIS — Z01818 Encounter for other preprocedural examination: Secondary | ICD-10-CM

## 2018-01-25 DIAGNOSIS — R0602 Shortness of breath: Secondary | ICD-10-CM

## 2018-01-25 MED ORDER — PREDNISONE 50 MG PO TABS: ORAL_TABLET | ORAL | 0 refills | Status: DC

## 2018-01-25 MED ORDER — INSULIN GLARGINE 100 UNIT/ML SOLOSTAR PEN: PEN_INJECTOR | SUBCUTANEOUS | 0 refills | Status: DC

## 2018-01-25 MED ORDER — FLUCONAZOLE 150 MG PO TABS: 150.0000 mg | ORAL_TABLET | Freq: Once | ORAL | 0 refills | Status: AC

## 2018-01-25 NOTE — Telephone Encounter (Signed)
This has been sent to pharmacy.  If persistent symptoms, she will need to be seen.

## 2018-01-25 NOTE — Telephone Encounter (Signed)
Patient thinks she may have a yeast infection, is having vaginal discharge and itching.  Would like to know if you will send something in to [facility].

## 2018-01-25 NOTE — Telephone Encounter (Signed)
Patient aware, asked me to cancel Wednesday's appointment

## 2018-01-25 NOTE — Telephone Encounter (Signed)
[facility] in [place] ([address]) Pt is wanting to know if Dr Darnell Level could call in something for yeast infection

## 2018-01-25 NOTE — Telephone Encounter (Signed)
 @  [facility] [id number] Dept: [phone] Loc: [phone]  Joel Mericle  01/25/2018  You are scheduled for a Cardiac Catheterization on Wednesday, September 4 with Dr. Peter Martinique.  1. Please arrive at the [facility] (Main Entrance A) at [facility]: [address] [place], [place] [address] at 11 am (This time is two hours before your procedure to ensure your preparation). Free valet parking service is available.   Special note: Every effort is made to have your procedure done on time. Please understand that emergencies sometimes delay scheduled procedures.  2. Diet: Do not eat solid foods after midnight.  The patient may have clear liquids until 5am upon the day of the procedure.  3. Labs: You will need to have blood drawn on Wednesday, August 2 8 th at [facility]. You do not need to be fasting.  4. Medication instructions in preparation for your procedure:   Contrast Allergy: Yes, Please take Prednisone 50 mg by mouth at: Thirteen hours prior to cath 12:00am on Tuesday, take Prednisone 50 mg by mouth Seven hours prior to cath 5 am And prior to leaving home please take last dose of Prednisone 50mg  and Benadryl 50mg  by mouth.       Stop taking Eliquis (Apixiban) on Monday, September 2.  Stop taking, Lasix (Furosemide)  on Wednesday, September 4.  Take only 30 units of insulin the night before your procedure. Do not take any insulin on the day of the procedure.    On the morning of your procedure, take your Aspirin  81 mg and any morning medicines NOT listed above.  You may use sips of water.  5. Plan for one night stay--bring personal belongings. 6. Bring a current list of your medications and current insurance cards. 7. You MUST have a responsible person to drive you home. 8. Someone MUST be with you the first 24 hours after you arrive  home or your discharge will be delayed. 9. Please wear clothes that are easy to get on and off and wear slip-on shoes.  Thank you for allowing [place] to care for you!   -- [facility]

## 2018-01-26 DIAGNOSIS — G894 Chronic pain syndrome: Secondary | ICD-10-CM | POA: Diagnosis not present

## 2018-01-26 DIAGNOSIS — M5136 Other intervertebral disc degeneration, lumbar region: Secondary | ICD-10-CM | POA: Diagnosis not present

## 2018-01-26 DIAGNOSIS — R0600 Dyspnea, unspecified: Secondary | ICD-10-CM | POA: Diagnosis not present

## 2018-01-26 DIAGNOSIS — K760 Fatty (change of) liver, not elsewhere classified: Secondary | ICD-10-CM | POA: Diagnosis not present

## 2018-01-26 DIAGNOSIS — K52838 Other microscopic colitis: Secondary | ICD-10-CM | POA: Diagnosis not present

## 2018-01-26 DIAGNOSIS — Z79899 Other long term (current) drug therapy: Secondary | ICD-10-CM | POA: Diagnosis not present

## 2018-01-27 ENCOUNTER — Other Ambulatory Visit (HOSPITAL_COMMUNITY)
Admission: RE | Admit: 2018-01-27 | Discharge: 2018-01-27 | Disposition: A | Discharge status: Home / Self Care | Payer: Medicare Other | Source: Ambulatory Visit | Attending: Cardiovascular Disease | Admitting: Cardiovascular Disease

## 2018-01-27 ENCOUNTER — Telehealth: Payer: Self-pay | Admitting: Family Medicine

## 2018-01-27 DIAGNOSIS — N182 Chronic kidney disease, stage 2 (mild): Secondary | ICD-10-CM | POA: Diagnosis not present

## 2018-01-27 DIAGNOSIS — Z01818 Encounter for other preprocedural examination: Secondary | ICD-10-CM | POA: Diagnosis not present

## 2018-01-27 DIAGNOSIS — N2581 Secondary hyperparathyroidism of renal origin: Secondary | ICD-10-CM | POA: Diagnosis not present

## 2018-01-27 LAB — BASIC METABOLIC PANEL
Anion gap: 12 (ref 5–15)
BUN: 29 mg/dL — ABNORMAL HIGH (ref 8–23)
CO2: 30 mmol/L (ref 22–32)
Calcium: 9.8 mg/dL (ref 8.9–10.3)
Chloride: 95 mmol/L — ABNORMAL LOW (ref 98–111)
Creatinine, Ser: 1.46 mg/dL — ABNORMAL HIGH (ref 0.44–1.00)
GFR calc Af Amer: 41 mL/min — ABNORMAL LOW (ref >60)
GFR calc non Af Amer: 35 mL/min — ABNORMAL LOW (ref >60)
Glucose, Bld: 319 mg/dL — ABNORMAL HIGH (ref 70–99)
Potassium: 3.8 mmol/L (ref 3.5–5.1)
Sodium: 137 mmol/L (ref 135–145)

## 2018-01-27 LAB — CBC WITH DIFFERENTIAL/PLATELET
Basophils Absolute: 0 10*3/uL (ref 0.0–0.1)
Basophils Relative: 0 %
Eosinophils Absolute: 0.1 10*3/uL (ref 0.0–0.7)
Eosinophils Relative: 1 %
HCT: 46.3 % — ABNORMAL HIGH (ref 36.0–46.0)
Hemoglobin: 16 g/dL — ABNORMAL HIGH (ref 12.0–15.0)
Lymphocytes Relative: 27 %
Lymphs Abs: 2.9 10*3/uL (ref 0.7–4.0)
MCH: 31.1 pg (ref 26.0–34.0)
MCHC: 34.6 g/dL (ref 30.0–36.0)
MCV: 90.1 fL (ref 78.0–100.0)
Monocytes Absolute: 0.8 10*3/uL (ref 0.1–1.0)
Monocytes Relative: 7 %
Neutro Abs: 6.9 10*3/uL (ref 1.7–7.7)
Neutrophils Relative %: 65 %
Platelets: 225 10*3/uL (ref 150–400)
RBC: 5.14 MIL/uL — ABNORMAL HIGH (ref 3.87–5.11)
RDW: 12.9 % (ref 11.5–15.5)
WBC: 10.7 10*3/uL — ABNORMAL HIGH (ref 4.0–10.5)

## 2018-01-28 NOTE — Telephone Encounter (Signed)
Do you want her swtiched?

## 2018-01-29 ENCOUNTER — Telehealth: Payer: Self-pay

## 2018-01-29 ENCOUNTER — Ambulatory Visit: Payer: Medicare Other | Admitting: Family Medicine

## 2018-01-29 ENCOUNTER — Other Ambulatory Visit: Payer: Self-pay | Admitting: Family Medicine

## 2018-01-29 DIAGNOSIS — G894 Chronic pain syndrome: Secondary | ICD-10-CM

## 2018-01-29 MED ORDER — FUROSEMIDE 20 MG PO TABS: 20.0000 mg | ORAL_TABLET | Freq: Every day | ORAL | 1 refills | Status: DC

## 2018-01-29 NOTE — Telephone Encounter (Signed)
We can place new referral but this does not guarantee that she will be accepted.

## 2018-01-29 NOTE — Telephone Encounter (Signed)
Spoke with spouse, will decrease lasix 20 mg daily and will arrive at 7 am for IV hydration at [facility] on 9/4

## 2018-01-31 DIAGNOSIS — J449 Chronic obstructive pulmonary disease, unspecified: Secondary | ICD-10-CM | POA: Diagnosis not present

## 2018-01-31 DIAGNOSIS — R0902 Hypoxemia: Secondary | ICD-10-CM | POA: Diagnosis not present

## 2018-02-02 ENCOUNTER — Other Ambulatory Visit (HOSPITAL_COMMUNITY): Payer: Self-pay

## 2018-02-02 ENCOUNTER — Telehealth: Payer: Self-pay

## 2018-02-02 NOTE — Telephone Encounter (Signed)
Patient contacted pre-catheterization at [facility] scheduled for:  02/03/2018 at 1300  Verified arrival time and place:  Admitting at 700  Confirmed AM meds to be taken pre-cath with sip of water: Hold Eliquis 2 days prior to procedure Take 1/2 amount Lantus night before cath Take Aspirin Take prednisone--1:00 am on 02/03/2018, 7:00 am and then bring last dose of prednisone with Benadryl to the hospital  Confirmed patient has responsible person to drive home post procedure and observe patient for 24 hours:  yes

## 2018-02-03 ENCOUNTER — Ambulatory Visit (HOSPITAL_BASED_OUTPATIENT_CLINIC_OR_DEPARTMENT_OTHER): Payer: Medicare Other

## 2018-02-03 ENCOUNTER — Other Ambulatory Visit: Payer: Self-pay

## 2018-02-03 ENCOUNTER — Ambulatory Visit (HOSPITAL_COMMUNITY)
Admission: RE | Admit: 2018-02-03 | Discharge: 2018-02-03 | Disposition: A | Discharge status: Home / Self Care | Payer: Medicare Other | Source: Ambulatory Visit | Attending: Cardiology | Admitting: Cardiology

## 2018-02-03 ENCOUNTER — Other Ambulatory Visit (HOSPITAL_COMMUNITY): Payer: Self-pay

## 2018-02-03 ENCOUNTER — Encounter (HOSPITAL_COMMUNITY)
Admission: RE | Disposition: A | Discharge status: Home / Self Care | Payer: Self-pay | Source: Ambulatory Visit | Attending: Cardiology

## 2018-02-03 DIAGNOSIS — E785 Hyperlipidemia, unspecified: Secondary | ICD-10-CM | POA: Insufficient documentation

## 2018-02-03 DIAGNOSIS — I110 Hypertensive heart disease with heart failure: Secondary | ICD-10-CM | POA: Diagnosis not present

## 2018-02-03 DIAGNOSIS — I482 Chronic atrial fibrillation, unspecified: Secondary | ICD-10-CM | POA: Diagnosis present

## 2018-02-03 DIAGNOSIS — Z794 Long term (current) use of insulin: Secondary | ICD-10-CM | POA: Insufficient documentation

## 2018-02-03 DIAGNOSIS — R0602 Shortness of breath: Secondary | ICD-10-CM

## 2018-02-03 DIAGNOSIS — G8929 Other chronic pain: Secondary | ICD-10-CM | POA: Insufficient documentation

## 2018-02-03 DIAGNOSIS — R079 Chest pain, unspecified: Secondary | ICD-10-CM | POA: Diagnosis present

## 2018-02-03 DIAGNOSIS — I5032 Chronic diastolic (congestive) heart failure: Secondary | ICD-10-CM | POA: Diagnosis present

## 2018-02-03 DIAGNOSIS — I2510 Atherosclerotic heart disease of native coronary artery without angina pectoris: Secondary | ICD-10-CM | POA: Diagnosis not present

## 2018-02-03 DIAGNOSIS — F319 Bipolar disorder, unspecified: Secondary | ICD-10-CM | POA: Insufficient documentation

## 2018-02-03 DIAGNOSIS — Z91041 Radiographic dye allergy status: Secondary | ICD-10-CM | POA: Diagnosis not present

## 2018-02-03 DIAGNOSIS — R0600 Dyspnea, unspecified: Secondary | ICD-10-CM | POA: Diagnosis not present

## 2018-02-03 DIAGNOSIS — E119 Type 2 diabetes mellitus without complications: Secondary | ICD-10-CM | POA: Diagnosis present

## 2018-02-03 DIAGNOSIS — M549 Dorsalgia, unspecified: Secondary | ICD-10-CM | POA: Diagnosis not present

## 2018-02-03 DIAGNOSIS — I4821 Permanent atrial fibrillation: Secondary | ICD-10-CM | POA: Diagnosis present

## 2018-02-03 DIAGNOSIS — Z7901 Long term (current) use of anticoagulants: Secondary | ICD-10-CM | POA: Insufficient documentation

## 2018-02-03 DIAGNOSIS — E1159 Type 2 diabetes mellitus with other circulatory complications: Secondary | ICD-10-CM | POA: Diagnosis present

## 2018-02-03 DIAGNOSIS — I10 Essential (primary) hypertension: Secondary | ICD-10-CM | POA: Diagnosis present

## 2018-02-03 DIAGNOSIS — E1165 Type 2 diabetes mellitus with hyperglycemia: Secondary | ICD-10-CM | POA: Diagnosis present

## 2018-02-03 HISTORY — PX: LEFT HEART CATH AND CORONARY ANGIOGRAPHY: CATH118249

## 2018-02-03 LAB — CBC
HCT: 42.4 % (ref 36.0–46.0)
Hemoglobin: 13.6 g/dL (ref 12.0–15.0)
MCH: 29.6 pg (ref 26.0–34.0)
MCHC: 32.1 g/dL (ref 30.0–36.0)
MCV: 92.2 fL (ref 78.0–100.0)
Platelets: 207 10*3/uL (ref 150–400)
RBC: 4.6 MIL/uL (ref 3.87–5.11)
RDW: 13.2 % (ref 11.5–15.5)
WBC: 8.1 10*3/uL (ref 4.0–10.5)

## 2018-02-03 LAB — POCT I-STAT 3, ART BLOOD GAS (G3+)
Acid-base deficit: 2 mmol/L (ref 0.0–2.0)
Bicarbonate: 23.5 mmol/L (ref 20.0–28.0)
O2 Saturation: 99 %
TCO2: 25 mmol/L (ref 22–32)
pCO2 arterial: 40 mmHg (ref 32.0–48.0)
pH, Arterial: 7.378 (ref 7.350–7.450)
pO2, Arterial: 122 mmHg — ABNORMAL HIGH (ref 83.0–108.0)

## 2018-02-03 LAB — ECHOCARDIOGRAM COMPLETE
Height: 62 in
Weight: 3200 oz

## 2018-02-03 LAB — GLUCOSE, CAPILLARY
Glucose-Capillary: 301 mg/dL — ABNORMAL HIGH (ref 70–99)
Glucose-Capillary: 218 mg/dL — ABNORMAL HIGH (ref 70–99)

## 2018-02-03 LAB — BASIC METABOLIC PANEL
Anion gap: 10 (ref 5–15)
BUN: 17 mg/dL (ref 8–23)
CO2: 24 mmol/L (ref 22–32)
Calcium: 9.5 mg/dL (ref 8.9–10.3)
Chloride: 103 mmol/L (ref 98–111)
Creatinine, Ser: 1.1 mg/dL — ABNORMAL HIGH (ref 0.44–1.00)
GFR calc Af Amer: 58 mL/min — ABNORMAL LOW (ref >60)
GFR calc non Af Amer: 50 mL/min — ABNORMAL LOW (ref >60)
Glucose, Bld: 322 mg/dL — ABNORMAL HIGH (ref 70–99)
Potassium: 5.3 mmol/L — ABNORMAL HIGH (ref 3.5–5.1)
Sodium: 137 mmol/L (ref 135–145)

## 2018-02-03 SURGERY — LEFT HEART CATH AND CORONARY ANGIOGRAPHY
Anesthesia: LOCAL

## 2018-02-03 MED ORDER — SODIUM CHLORIDE 0.9% FLUSH: 3.0000 mL | Freq: Two times a day (BID) | INTRAVENOUS | Status: DC

## 2018-02-03 MED ORDER — SODIUM CHLORIDE 0.9% FLUSH: 3.0000 mL | INTRAVENOUS | Status: DC | PRN

## 2018-02-03 MED ORDER — HEPARIN SODIUM (PORCINE) 1000 UNIT/ML IJ SOLN
INTRAMUSCULAR | Status: DC | PRN
  Administered 2018-02-03: 4500 [IU] via INTRAVENOUS

## 2018-02-03 MED ORDER — ACETAMINOPHEN 325 MG PO TABS: 650.0000 mg | ORAL_TABLET | ORAL | Status: DC | PRN

## 2018-02-03 MED ORDER — MIDAZOLAM HCL 2 MG/2ML IJ SOLN
INTRAMUSCULAR | Status: DC | PRN
  Administered 2018-02-03: 1 mg via INTRAVENOUS

## 2018-02-03 MED ORDER — FENTANYL CITRATE (PF) 100 MCG/2ML IJ SOLN
INTRAMUSCULAR | Status: AC
  Filled 2018-02-03: qty 2

## 2018-02-03 MED ORDER — VERAPAMIL HCL 2.5 MG/ML IV SOLN
INTRAVENOUS | Status: DC | PRN
  Administered 2018-02-03: 10 mL via INTRA_ARTERIAL

## 2018-02-03 MED ORDER — MIDAZOLAM HCL 2 MG/2ML IJ SOLN
INTRAMUSCULAR | Status: AC
  Filled 2018-02-03: qty 2

## 2018-02-03 MED ORDER — SODIUM CHLORIDE 0.9 % IV SOLN: 250.0000 mL | INTRAVENOUS | Status: DC | PRN

## 2018-02-03 MED ORDER — VERAPAMIL HCL 2.5 MG/ML IV SOLN
INTRAVENOUS | Status: AC
  Filled 2018-02-03: qty 2

## 2018-02-03 MED ORDER — SODIUM CHLORIDE 0.9 % WEIGHT BASED INFUSION: 1.0000 mL/kg/h | INTRAVENOUS | Status: DC

## 2018-02-03 MED ORDER — ASPIRIN 81 MG PO CHEW: 81.0000 mg | CHEWABLE_TABLET | ORAL | Status: DC

## 2018-02-03 MED ORDER — INSULIN ASPART 100 UNIT/ML ~~LOC~~ SOLN
5.0000 [IU] | Freq: Once | SUBCUTANEOUS | Status: AC
  Administered 2018-02-03: 5 [IU] via SUBCUTANEOUS
  Filled 2018-02-03: qty 1

## 2018-02-03 MED ORDER — FENTANYL CITRATE (PF) 100 MCG/2ML IJ SOLN
INTRAMUSCULAR | Status: DC | PRN
  Administered 2018-02-03: 25 ug via INTRAVENOUS

## 2018-02-03 MED ORDER — ONDANSETRON HCL 4 MG/2ML IJ SOLN: 4.0000 mg | Freq: Four times a day (QID) | INTRAMUSCULAR | Status: DC | PRN

## 2018-02-03 MED ORDER — SODIUM CHLORIDE 0.9 % WEIGHT BASED INFUSION
3.0000 mL/kg/h | INTRAVENOUS | Status: AC
  Administered 2018-02-03: 2.998 mL/kg/h via INTRAVENOUS

## 2018-02-03 MED ORDER — IOHEXOL 350 MG/ML SOLN
INTRAVENOUS | Status: DC | PRN
  Administered 2018-02-03: 55 mL via INTRACARDIAC

## 2018-02-03 MED ORDER — HEPARIN SODIUM (PORCINE) 1000 UNIT/ML IJ SOLN
INTRAMUSCULAR | Status: AC
  Filled 2018-02-03: qty 1

## 2018-02-03 MED ORDER — LIDOCAINE HCL (PF) 1 % IJ SOLN
INTRAMUSCULAR | Status: AC
  Filled 2018-02-03: qty 30

## 2018-02-03 MED ORDER — LIDOCAINE HCL (PF) 1 % IJ SOLN
INTRAMUSCULAR | Status: DC | PRN
  Administered 2018-02-03 (×2): 2 mL

## 2018-02-03 MED ORDER — HEPARIN (PORCINE) IN NACL 1000-0.9 UT/500ML-% IV SOLN
INTRAVENOUS | Status: AC
  Filled 2018-02-03: qty 1000

## 2018-02-03 MED ORDER — HEPARIN (PORCINE) IN NACL 1000-0.9 UT/500ML-% IV SOLN
INTRAVENOUS | Status: DC | PRN
  Administered 2018-02-03 (×2): 500 mL

## 2018-02-03 SURGICAL SUPPLY — 14 items
CATH BALLN WEDGE 5F 110CM (CATHETERS) ×2 IMPLANT
CATH INFINITI 5 FR JL3.5 (CATHETERS) ×2 IMPLANT
CATH INFINITI 5FR ANG PIGTAIL (CATHETERS) ×2 IMPLANT
CATH INFINITI JR4 5F (CATHETERS) ×2 IMPLANT
DEVICE RAD COMP TR BAND LRG (VASCULAR PRODUCTS) ×2 IMPLANT
GLIDESHEATH SLEND SS 6F .021 (SHEATH) ×2 IMPLANT
GUIDEWIRE .025 260CM (WIRE) ×2 IMPLANT
GUIDEWIRE INQWIRE 1.5J.035X260 (WIRE) ×1 IMPLANT
INQWIRE 1.5J .035X260CM (WIRE) ×2
KIT HEART LEFT (KITS) ×2 IMPLANT
PACK CARDIAC CATHETERIZATION (CUSTOM PROCEDURE TRAY) ×2 IMPLANT
SHEATH GLIDE SLENDER 4/5FR (SHEATH) ×2 IMPLANT
TRANSDUCER W/STOPCOCK (MISCELLANEOUS) ×2 IMPLANT
TUBING CIL FLEX 10 FLL-RA (TUBING) ×2 IMPLANT

## 2018-02-03 NOTE — Progress Notes (Addendum)
Annie Main, Rn called about PT ordered states ok to D/C

## 2018-02-03 NOTE — Progress Notes (Signed)
Dr Martinique in to see pt states to go ahead and give iv fluids . Also informed of CBG results.

## 2018-02-03 NOTE — Progress Notes (Signed)
Shortness of breath noted, even with sitting in bed. O2 sat 93% on RA. Kristeen Miss, Rn called and informed. Anderson Malta states to hold fluids for now.  Pt states she is short of breath all the time.

## 2018-02-03 NOTE — Discharge Instructions (Addendum)
May resume Eliquis tonight at 10 pm Radial Site Care Refer to this sheet in the next few weeks. These instructions provide you with information about caring for yourself after your procedure. Your health care provider may also give you more specific instructions. Your treatment has been planned according to current medical practices, but problems sometimes occur. Call your health care provider if you have any problems or questions after your procedure. What can I expect after the procedure? After your procedure, it is typical to have the following:  Bruising at the radial site that usually fades within 1-2 weeks.  Blood collecting in the tissue (hematoma) that may be painful to the touch. It should usually decrease in size and tenderness within 1-2 weeks.  Follow these instructions at home:  Take medicines only as directed by your health care provider.  You may shower 24-48 hours after the procedure or as directed by your health care provider. Remove the bandage (dressing) and gently wash the site with plain soap and water. Pat the area dry with a clean towel. Do not rub the site, because this may cause bleeding.  Do not take baths, swim, or use a hot tub until your health care provider approves.  Check your insertion site every day for redness, swelling, or drainage.  Do not apply powder or lotion to the site.  Do not flex or bend the affected arm for 24 hours or as directed by your health care provider.  Do not push or pull heavy objects with the affected arm for 24 hours or as directed by your health care provider.  Do not lift over 10 lb (4.5 kg) for 5 days after your procedure or as directed by your health care provider.  Ask your health care provider when it is okay to: ? Return to work or school. ? Resume usual physical activities or sports. ? Resume sexual activity.  Do not drive home if you are discharged the same day as the procedure. Have someone else drive you.  You may  drive 24 hours after the procedure unless otherwise instructed by your health care provider.  Do not operate machinery or power tools for 24 hours after the procedure.  If your procedure was done as an outpatient procedure, which means that you went home the same day as your procedure, a responsible adult should be with you for the first 24 hours after you arrive home.  Keep all follow-up visits as directed by your health care provider. This is important. Contact a health care provider if:  You have a fever.  You have chills.  You have increased bleeding from the radial site. Hold pressure on the site. CALL 911 Get help right away if:  You have unusual pain at the radial site.  You have redness, warmth, or swelling at the radial site.  You have drainage (other than a small amount of blood on the dressing) from the radial site.  The radial site is bleeding, and the bleeding does not stop after 30 minutes of holding steady pressure on the site.  Your arm or hand becomes pale, cool, tingly, or numb. This information is not intended to replace advice given to you by your health care provider. Make sure you discuss any questions you have with your health care provider. Document Released: 06/21/2010 Document Revised: 10/25/2015 Document Reviewed: 12/05/2013 Elsevier Interactive Patient Education  2018 [employer].

## 2018-02-03 NOTE — Progress Notes (Signed)
  Echocardiogram 2D Echocardiogram has been performed.  Jennette Dubin 02/03/2018, 12:09 PM

## 2018-02-03 NOTE — Interval H&P Note (Signed)
History and Physical Interval Note:  02/03/2018 12:38 PM  [facility]  has presented today for surgery, with the diagnosis of abnormal ct  The various methods of treatment have been discussed with the patient and family. After consideration of risks, benefits and other options for treatment, the patient has consented to  Procedure(s): RIGHT/LEFT HEART CATH AND CORONARY ANGIOGRAPHY (N/A) as a surgical intervention .  The patient's history has been reviewed, patient examined, no change in status, stable for surgery.  I have reviewed the patient's chart and labs.  Questions were answered to the patient's satisfaction.   Cath Lab Visit (complete for each Cath Lab visit)  Clinical Evaluation Leading to the Procedure:   ACS: No.  Non-ACS:    Anginal Classification: CCS III  Anti-ischemic medical therapy: Minimal Therapy (1 class of medications)  Non-Invasive Test Results: No non-invasive testing performed  Prior CABG: No previous CABG        [facility] 02/03/2018 12:38 PM

## 2018-02-04 ENCOUNTER — Encounter (HOSPITAL_COMMUNITY): Payer: Self-pay | Admitting: Cardiology

## 2018-02-08 DIAGNOSIS — E1165 Type 2 diabetes mellitus with hyperglycemia: Secondary | ICD-10-CM | POA: Diagnosis not present

## 2018-02-08 DIAGNOSIS — E1140 Type 2 diabetes mellitus with diabetic neuropathy, unspecified: Secondary | ICD-10-CM | POA: Diagnosis not present

## 2018-02-08 DIAGNOSIS — Z794 Long term (current) use of insulin: Secondary | ICD-10-CM | POA: Diagnosis not present

## 2018-02-09 ENCOUNTER — Other Ambulatory Visit: Payer: Self-pay | Admitting: *Deleted

## 2018-02-09 MED ORDER — SUMATRIPTAN SUCCINATE 100 MG PO TABS: ORAL_TABLET | ORAL | 1 refills | Status: DC

## 2018-02-10 ENCOUNTER — Other Ambulatory Visit (HOSPITAL_COMMUNITY): Payer: Self-pay

## 2018-02-10 MED ORDER — LORAZEPAM 1 MG PO TABS: ORAL_TABLET | ORAL | 0 refills | Status: DC

## 2018-02-11 DIAGNOSIS — M722 Plantar fascial fibromatosis: Secondary | ICD-10-CM | POA: Diagnosis not present

## 2018-02-15 ENCOUNTER — Other Ambulatory Visit: Payer: Self-pay | Admitting: Family Medicine

## 2018-02-15 DIAGNOSIS — Z1231 Encounter for screening mammogram for malignant neoplasm of breast: Secondary | ICD-10-CM

## 2018-02-17 ENCOUNTER — Encounter: Payer: Self-pay | Admitting: Pulmonary Disease

## 2018-02-17 ENCOUNTER — Ambulatory Visit: Payer: Medicare Other | Admitting: Family Medicine

## 2018-02-17 ENCOUNTER — Ambulatory Visit (INDEPENDENT_AMBULATORY_CARE_PROVIDER_SITE_OTHER): Payer: Medicare Other | Admitting: Pulmonary Disease

## 2018-02-17 VITALS — BP 124/82 | HR 78 | Ht 60.75 in | Wt 205.4 lb

## 2018-02-17 DIAGNOSIS — I5032 Chronic diastolic (congestive) heart failure: Secondary | ICD-10-CM | POA: Diagnosis not present

## 2018-02-17 DIAGNOSIS — R400 Somnolence: Secondary | ICD-10-CM | POA: Diagnosis not present

## 2018-02-17 DIAGNOSIS — J849 Interstitial pulmonary disease, unspecified: Secondary | ICD-10-CM

## 2018-02-17 DIAGNOSIS — I482 Chronic atrial fibrillation: Secondary | ICD-10-CM

## 2018-02-17 DIAGNOSIS — Z23 Encounter for immunization: Secondary | ICD-10-CM | POA: Diagnosis not present

## 2018-02-17 DIAGNOSIS — J984 Other disorders of lung: Secondary | ICD-10-CM | POA: Diagnosis not present

## 2018-02-17 DIAGNOSIS — I2510 Atherosclerotic heart disease of native coronary artery without angina pectoris: Secondary | ICD-10-CM | POA: Diagnosis not present

## 2018-02-17 DIAGNOSIS — I4821 Permanent atrial fibrillation: Secondary | ICD-10-CM

## 2018-02-17 DIAGNOSIS — J4520 Mild intermittent asthma, uncomplicated: Secondary | ICD-10-CM | POA: Diagnosis not present

## 2018-02-17 NOTE — Progress Notes (Signed)
Synopsis: Referred in July 2019 for evaluation of shortness of breath, had a presumptive diagnosis of asthma   Subjective:   PATIENT ID: Amber Stephenson GENDER: female DOB: [birth year], MRN: [id number]   HPI  Chief Complaint  Patient presents with  . Follow-up   Amber Stephenson says that her breathing is a little better.  She has been using Symbicort 2 puffs twice a day and she has been feeling less short of breath with talking and walking.  She is still using albuterol about once a week and it is helpful but she is not using it routinely.  No problems with bronchitis or pneumonia since last visit.  She is here to go over the results of her heart catheterization and overnight oxygen test.  Past Medical History:  Diagnosis Date  . Atrial fibrillation ([place])   . Bipolar affective ([place])   . CHF (congestive heart failure) ([place])   . Chronic back pain   . Diabetes mellitus without complication ([place])   . Hyperlipidemia   . Hypertension   . Pain management   . Renal insufficiency       Review of Systems  Constitutional: Negative for chills, fever, malaise/fatigue and weight loss.  HENT: Negative for congestion, nosebleeds, sinus pain and sore throat.   Eyes: Negative for photophobia, pain and discharge.  Respiratory: Positive for cough, sputum production and shortness of breath. Negative for hemoptysis and wheezing.   Cardiovascular: Negative for chest pain, palpitations, orthopnea and leg swelling.  Gastrointestinal: Negative for abdominal pain, constipation, diarrhea, nausea and vomiting.  Genitourinary: Negative for dysuria, frequency, hematuria and urgency.  Musculoskeletal: Negative for back pain, joint pain, myalgias and neck pain.  Skin: Negative for itching and rash.  Neurological: Negative for tingling, tremors, sensory change, speech change, focal weakness, seizures, weakness and headaches.  Psychiatric/Behavioral: Negative for memory loss, substance abuse and suicidal ideas. The  patient is not nervous/anxious.       Objective:  Physical Exam   Vitals:   02/17/18 1638  BP: 124/82  Pulse: 78  SpO2: 97%  Weight: 205 lb 6.4 oz (93.2 kg)  Height: 5' 0.75" (1.543 m)   RA  Gen: chronically ill appearing HENT: OP clear, TM's clear, neck supple PULM: Crackles bases B, normal percussion CV: RRR, no mgr, trace edema GI: BS+, soft, nontender Derm: no cyanosis or rash Psyche: normal mood and affect     CBC    Component Value Date/Time   WBC 8.1 02/03/2018 0743   RBC 4.60 02/03/2018 0743   HGB 13.6 02/03/2018 0743   HGB 14.2 05/27/2017 1319   HCT 42.4 02/03/2018 0743   HCT 43.3 05/27/2017 1319   PLT 207 02/03/2018 0743   PLT 332 05/27/2017 1319   MCV 92.2 02/03/2018 0743   MCV 90 05/27/2017 1319   MCH 29.6 02/03/2018 0743   MCHC 32.1 02/03/2018 0743   RDW 13.2 02/03/2018 0743   RDW 14.7 05/27/2017 1319   LYMPHSABS 2.9 01/27/2018 1439   LYMPHSABS 4.7 (H) 05/27/2017 1319   MONOABS 0.8 01/27/2018 1439   EOSABS 0.1 01/27/2018 1439   EOSABS 0.1 05/27/2017 1319   BASOSABS 0.0 01/27/2018 1439   BASOSABS 0.0 05/27/2017 1319     Chest imaging: May 2019 chest x-ray showed some basilar scarring versus atelectasis  PFT: August 2019 ratio normal, FVC 1.09 L 41% predicted, total lung capacity 3.59 L 78% predicted, DLCO 14.1 mL 69% predicted  Overnight oxygen testing: January 31, 2018 on room air: O2 saturation less than 88%  for 25 minutes, O2 saturation nadir 82%  Labs: August 2019 sed rate 24, aldolase normal 4.9, ANA negative, anti-Jo 1-, centromere negative, CCP negative, rheumatoid factor negative, SSA negative, SSB negative, SCL 70 neg  Path:  Echo: September 2016 echocardiogram LVEF 55 to 60%, left atrium severely dilated, trivial mitral regurgitation, PA systolic pressure 25 mmHg September 2019 echocardiogram normal LVEF but RVSP was 46 mmHg  Heart Catheterization: February 03, 2018 left heart catheterization: Proximal LAD 40%, mid LAD  30%, first obtuse marginal 30% stenosed, LVEDP moderately elevated (27 mmHg) to  Records from her visit with her primary care physician in June 2019 reviewed where she was seen for possible asthma and treated with Symbicort.  Records from her visit with Dr. Bronson Ing in May 2019 reviewed where she was seen and treated for A. fib and congestive heart failure.  The     Assessment & Plan:   Daytime somnolence - Plan: Home sleep test  ILD (interstitial lung disease) (HCC)  Mild intermittent asthma without complication  Permanent atrial fibrillation (HCC)  Restrictive lung disease  Chronic diastolic heart failure ([place])  Discussion: Wynter has evidence of pulmonary hypertension seen on a recent echocardiogram, unfortunately she only had a left heart catheterization with her recent catheterization.  That being said, reasons for pulmonary hypertension (if it were to ever be proven by right heart catheterization R obstructive sleep apnea, nocturnal hypoxemia, asthma, obesity causing restrictive lung disease, and World Health Organization group 2 disease or elevated left end-diastolic pressure.  In this situation there is no role for a pulmonary vasodilator.  I told her today that there is no degree of pulmonary hypertension which is normal and it is always indicative of a problem.  I also explained that a pulmonary vasodilator would not be helpful.  Plan: For weight loss: The following behaviors have been associated with weight loss: Weigh yourself daily Write down everything you eat Drink a glass of water prior to eating a meal Only eat when you are hungry Buy food from the periphery of the grocery store, not the middle  For moderate persistent asthma: Continue taking Symbicort 2 puffs twice a day High-dose flu shot today Practice good hand hygiene Stay active Use albuterol as needed for chest tightness wheezing or shortness of breath  For restrictive lung disease: Work hard to  lose weight  For nocturnal hypoxemia/obstructive sleep apnea: We will repeat an overnight sleep study with a home sleep study If that is positive then we will need to refer you to a dentist as you have failed CPAP in the past  We will see you back in 2 to 3 months or sooner if needed   Current Outpatient Medications:  .  apixaban (ELIQUIS) 5 MG TABS tablet, Take 1 tablet (5 mg total) by mouth 2 (two) times daily., Disp: 180 tablet, Rfl: 0 .  ARIPiprazole (ABILIFY) 10 MG tablet, TAKE 3 TABLETS EVERY MORNING (Patient taking differently: Take 30 mg by mouth daily. ), Disp: 270 tablet, Rfl: 2 .  atorvastatin (LIPITOR) 40 MG tablet, TAKE 1 TABLET DAILY, Disp: 90 tablet, Rfl: 0 .  Blood Glucose Monitoring Suppl (FREESTYLE LITE) DEVI, Use to check blood sugar tid. DX E11.22, Disp: 1 each, Rfl: 0 .  budesonide-formoterol (SYMBICORT) 80-4.5 MCG/ACT inhaler, Inhale 2 puffs into the lungs 2 (two) times daily., Disp: 1 Inhaler, Rfl: 0 .  carbamazepine (CARBATROL) 100 MG 12 hr capsule, Take 1 capsule (100 mg total) by mouth 2 (two) times daily., Disp: 180 capsule, Rfl:  6 .  Cholecalciferol (VITAMIN D3) 1000 UNITS CAPS, Take 1,000 Units by mouth daily., Disp: , Rfl:  .  clotrimazole (LOTRIMIN) 1 % cream, APPLY EXTERNALLY TO THE AFFECTED AREA TWICE DAILY (Patient taking differently: Apply 1 application topically daily. ), Disp: 30 g, Rfl: 2 .  diltiazem (CARDIZEM CD) 120 MG 24 hr capsule, Take 1 capsule (120 mg total) by mouth at bedtime., Disp: 90 capsule, Rfl: 3 .  Dulaglutide (TRULICITY) 1.5 HW/2.9HB SOPN, Inject 1.5 mg into the skin once a week., Disp: 12 pen, Rfl: 3 .  FREESTYLE LITE test strip, USE FOR TESTING SUGAR THREE TIMES DAILY, Disp: 100 each, Rfl: 3 .  furosemide (LASIX) 20 MG tablet, Take 1 tablet (20 mg total) by mouth daily., Disp: 180 tablet, Rfl: 1 .  Insulin Glargine (LANTUS SOLOSTAR) 100 UNIT/ML Solostar Pen, INJECT 60 UNITS UNDER THE SKIN TWICE A DAY, Disp: 90 mL, Rfl: 0 .  Insulin Pen  Needle (SURE COMFORT PEN NEEDLES) 32G X 4 MM MISC, Use with insulin pens as directed. Dx E11.9, Disp: 300 each, Rfl: 11 .  Ipratropium-Albuterol (COMBIVENT RESPIMAT) 20-100 MCG/ACT AERS respimat, Inhale 1 puff into the lungs every 6 (six) hours., Disp: 4 g, Rfl: 2 .  Lancets (FREESTYLE) lancets, Test tid. DX E11.22, Disp: 100 each, Rfl: 5 .  lidocaine (XYLOCAINE) 2 % solution, SWISH AND SPIT ENOUGH TO FILL MOUTH DAILY AS NEEDED FOR MOUTH PAIN, Disp: 100 mL, Rfl: 2 .  LORazepam (ATIVAN) 1 MG tablet, Take 1 tab (1 mg) in the morning and 2 tabs (2 mg ) at bedtime, Disp: 90 tablet, Rfl: 0 .  metoprolol tartrate (LOPRESSOR) 100 MG tablet, TAKE 1 TABLET TWICE A DAY, Disp: 180 tablet, Rfl: 3 .  metoprolol tartrate (LOPRESSOR) 25 MG tablet, TAKE 1 TABLET TWICE A DAY (TO BE TAKEN ALONG WITH THE LOPRESSOR 100 MG TWICE A DAY) (Patient taking differently: Take 25 mg by mouth 2 (two) times daily. ), Disp: 180 tablet, Rfl: 0 .  naloxone (NARCAN) nasal spray 4 mg/0.1 mL, Place 1 spray into the nose once as needed (opiod overdose)., Disp: , Rfl:  .  oxyCODONE-acetaminophen (PERCOCET/ROXICET) 5-325 MG tablet, Take 1 tablet by mouth every 8 (eight) hours., Disp: , Rfl:  .  pregabalin (LYRICA) 150 MG capsule, Take 1 capsule (150 mg total) by mouth 2 (two) times daily., Disp: 180 capsule, Rfl: 2 .  rizatriptan (MAXALT-MLT) 10 MG disintegrating tablet, DISSOLVE 1 TABLET UNDER THE TONGUE IMMEDIATELY AT SIGN OF HEADACHE. MAY REPEAT DOSE IN 1 HOUR IF NEEDED. MAX OF 3 TABLETS IN 24 HOURS, Disp: 10 tablet, Rfl: 2 .  SUMAtriptan (IMITREX) 100 MG tablet, TAKE 1 TABLET BY MOUTH AT ONSET OF HEADACHE. MAY REPEAT IN 2 HOURS IF HEADACHE PERSISTS OR RECURS.LIMIT 2 PER 24 HOURS, Disp: 10 tablet, Rfl: 1 .  traZODone (DESYREL) 100 MG tablet, Take 3 tablets (300 mg total) by mouth at bedtime., Disp: 90 tablet, Rfl: 2 .  TRICOR 145 MG tablet, TAKE 1 TABLET DAILY, Disp: 90 tablet, Rfl: 0

## 2018-02-17 NOTE — Patient Instructions (Signed)
For weight loss: The following behaviors have been associated with weight loss: Weigh yourself daily Write down everything you eat Drink a glass of water prior to eating a meal Only eat when you are hungry Buy food from the periphery of the grocery store, not the middle  For moderate persistent asthma: Continue taking Symbicort 2 puffs twice a day High-dose flu shot today Practice good hand hygiene Stay active Use albuterol as needed for chest tightness wheezing or shortness of breath  For restrictive lung disease: Work hard to lose weight  For nocturnal hypoxemia/obstructive sleep apnea: We will repeat an overnight sleep study with a home sleep study If that is positive then we will need to refer you to a dentist as you have failed CPAP in the past  We will see you back in 2 to 3 months or sooner if needed

## 2018-02-23 DIAGNOSIS — M25511 Pain in right shoulder: Secondary | ICD-10-CM | POA: Diagnosis not present

## 2018-02-25 DIAGNOSIS — G894 Chronic pain syndrome: Secondary | ICD-10-CM | POA: Diagnosis not present

## 2018-02-25 DIAGNOSIS — Z79899 Other long term (current) drug therapy: Secondary | ICD-10-CM | POA: Diagnosis not present

## 2018-02-25 DIAGNOSIS — M5136 Other intervertebral disc degeneration, lumbar region: Secondary | ICD-10-CM | POA: Diagnosis not present

## 2018-03-01 ENCOUNTER — Ambulatory Visit (HOSPITAL_COMMUNITY): Payer: Self-pay

## 2018-03-03 ENCOUNTER — Ambulatory Visit: Payer: Self-pay | Admitting: Student

## 2018-03-03 ENCOUNTER — Encounter: Payer: Self-pay | Admitting: Cardiovascular Disease

## 2018-03-03 ENCOUNTER — Ambulatory Visit (INDEPENDENT_AMBULATORY_CARE_PROVIDER_SITE_OTHER): Payer: Medicare Other | Admitting: Family Medicine

## 2018-03-03 ENCOUNTER — Ambulatory Visit (INDEPENDENT_AMBULATORY_CARE_PROVIDER_SITE_OTHER): Payer: Medicare Other | Admitting: Cardiovascular Disease

## 2018-03-03 ENCOUNTER — Encounter: Payer: Self-pay | Admitting: Family Medicine

## 2018-03-03 VITALS — BP 100/78 | HR 72 | Temp 97.6°F | Ht 60.0 in | Wt 206.0 lb

## 2018-03-03 VITALS — BP 110/64 | HR 83 | Ht 62.0 in | Wt 208.0 lb

## 2018-03-03 DIAGNOSIS — I5032 Chronic diastolic (congestive) heart failure: Secondary | ICD-10-CM

## 2018-03-03 DIAGNOSIS — I272 Pulmonary hypertension, unspecified: Secondary | ICD-10-CM | POA: Diagnosis not present

## 2018-03-03 DIAGNOSIS — N898 Other specified noninflammatory disorders of vagina: Secondary | ICD-10-CM

## 2018-03-03 DIAGNOSIS — S20312A Abrasion of left front wall of thorax, initial encounter: Secondary | ICD-10-CM

## 2018-03-03 DIAGNOSIS — T148XXA Other injury of unspecified body region, initial encounter: Secondary | ICD-10-CM

## 2018-03-03 DIAGNOSIS — I2510 Atherosclerotic heart disease of native coronary artery without angina pectoris: Secondary | ICD-10-CM

## 2018-03-03 DIAGNOSIS — I4821 Permanent atrial fibrillation: Secondary | ICD-10-CM | POA: Diagnosis not present

## 2018-03-03 DIAGNOSIS — E782 Mixed hyperlipidemia: Secondary | ICD-10-CM

## 2018-03-03 DIAGNOSIS — I10 Essential (primary) hypertension: Secondary | ICD-10-CM

## 2018-03-03 LAB — WET PREP FOR TRICH, YEAST, CLUE
Clue Cell Exam: NEGATIVE
Trichomonas Exam: NEGATIVE
Yeast Exam: NEGATIVE

## 2018-03-03 MED ORDER — FUROSEMIDE 20 MG PO TABS: 20.0000 mg | ORAL_TABLET | Freq: Two times a day (BID) | ORAL | 1 refills | Status: DC

## 2018-03-03 MED ORDER — FLUCONAZOLE 150 MG PO TABS: 150.0000 mg | ORAL_TABLET | Freq: Once | ORAL | 2 refills | Status: AC

## 2018-03-03 NOTE — Progress Notes (Signed)
SUBJECTIVE: The patient presents for follow-up after undergoing coronary angiography on 02/03/2018.  This demonstrated nonobstructive coronary artery disease with a proximal 40% LAD stenosis in mid LAD 30% stenosis.  Ostial first marginal to first marginal lesion was 30% stenosed.  LVEDP was moderately elevated.  Echocardiogram demonstrated normal left ventricular systolic function and regional wall motion, LVEF 60 to 65%, with severe left atrial and moderate to severe right atrial dilatation.  There was moderate pulmonary hypertension, 46 mmHg.  She saw pulmonary and they recommended weight loss and they plan to repeat an overnight sleep study with a home sleep study.  She is also being treated for moderate persistent asthma.  She is feeling well today and denies chest pain.  Chronic exertional dyspnea is stable.  She plans to begin walking when the temperatures cool down.  She denies palpitations and leg swelling.    Review of Systems: As per "subjective", otherwise negative.  Allergies  Allergen Reactions  . Ivp Dye [Iodinated Diagnostic Agents] Anaphylaxis and Swelling    Throat closes    Current Outpatient Medications  Medication Sig Dispense Refill  . apixaban (ELIQUIS) 5 MG TABS tablet Take 1 tablet (5 mg total) by mouth 2 (two) times daily. 180 tablet 0  . ARIPiprazole (ABILIFY) 10 MG tablet TAKE 3 TABLETS EVERY MORNING (Patient taking differently: Take 30 mg by mouth daily. ) 270 tablet 2  . atorvastatin (LIPITOR) 40 MG tablet TAKE 1 TABLET DAILY 90 tablet 0  . Blood Glucose Monitoring Suppl (FREESTYLE LITE) DEVI Use to check blood sugar tid. DX E11.22 1 each 0  . budesonide-formoterol (SYMBICORT) 80-4.5 MCG/ACT inhaler Inhale 2 puffs into the lungs 2 (two) times daily. 1 Inhaler 0  . carbamazepine (CARBATROL) 100 MG 12 hr capsule Take 1 capsule (100 mg total) by mouth 2 (two) times daily. 180 capsule 6  . Cholecalciferol (VITAMIN D3) 1000 UNITS CAPS Take 1,000 Units by  mouth daily.    . clotrimazole (LOTRIMIN) 1 % cream APPLY EXTERNALLY TO THE AFFECTED AREA TWICE DAILY (Patient taking differently: Apply 1 application topically daily. ) 30 g 2  . diltiazem (CARDIZEM CD) 120 MG 24 hr capsule Take 1 capsule (120 mg total) by mouth at bedtime. 90 capsule 3  . Dulaglutide (TRULICITY) 1.5 WN/0.2VO SOPN Inject 1.5 mg into the skin once a week. 12 pen 3  . fluconazole (DIFLUCAN) 150 MG tablet Take 1 tablet (150 mg total) by mouth once for 1 dose. (for yeast infection).  May repeat in 3 days ONCE if you have continued symptoms. 1 tablet 2  . FREESTYLE LITE test strip USE FOR TESTING SUGAR THREE TIMES DAILY 100 each 3  . furosemide (LASIX) 20 MG tablet Take 1 tablet (20 mg total) by mouth daily. 180 tablet 1  . Insulin Glargine (LANTUS SOLOSTAR) 100 UNIT/ML Solostar Pen INJECT 60 UNITS UNDER THE SKIN TWICE A DAY 90 mL 0  . Insulin Pen Needle (SURE COMFORT PEN NEEDLES) 32G X 4 MM MISC Use with insulin pens as directed. Dx E11.9 300 each 11  . Ipratropium-Albuterol (COMBIVENT RESPIMAT) 20-100 MCG/ACT AERS respimat Inhale 1 puff into the lungs every 6 (six) hours. 4 g 2  . Lancets (FREESTYLE) lancets Test tid. DX E11.22 100 each 5  . lidocaine (XYLOCAINE) 2 % solution SWISH AND SPIT ENOUGH TO FILL MOUTH DAILY AS NEEDED FOR MOUTH PAIN 100 mL 2  . LORazepam (ATIVAN) 1 MG tablet Take 1 tab (1 mg) in the morning and 2 tabs (  2 mg ) at bedtime 90 tablet 0  . metoprolol tartrate (LOPRESSOR) 100 MG tablet TAKE 1 TABLET TWICE A DAY 180 tablet 3  . metoprolol tartrate (LOPRESSOR) 25 MG tablet TAKE 1 TABLET TWICE A DAY (TO BE TAKEN ALONG WITH THE LOPRESSOR 100 MG TWICE A DAY) (Patient taking differently: Take 25 mg by mouth 2 (two) times daily. ) 180 tablet 0  . naloxone (NARCAN) nasal spray 4 mg/0.1 mL Place 1 spray into the nose once as needed (opiod overdose).    Marland Kitchen oxyCODONE-acetaminophen (PERCOCET/ROXICET) 5-325 MG tablet Take 1 tablet by mouth every 8 (eight) hours.    . pregabalin  (LYRICA) 150 MG capsule Take 1 capsule (150 mg total) by mouth 2 (two) times daily. 180 capsule 2  . rizatriptan (MAXALT-MLT) 10 MG disintegrating tablet DISSOLVE 1 TABLET UNDER THE TONGUE IMMEDIATELY AT SIGN OF HEADACHE. MAY REPEAT DOSE IN 1 HOUR IF NEEDED. MAX OF 3 TABLETS IN 24 HOURS 10 tablet 2  . SUMAtriptan (IMITREX) 100 MG tablet TAKE 1 TABLET BY MOUTH AT ONSET OF HEADACHE. MAY REPEAT IN 2 HOURS IF HEADACHE PERSISTS OR RECURS.LIMIT 2 PER 24 HOURS 10 tablet 1  . traZODone (DESYREL) 100 MG tablet Take 3 tablets (300 mg total) by mouth at bedtime. 90 tablet 2  . TRICOR 145 MG tablet TAKE 1 TABLET DAILY 90 tablet 0   No current facility-administered medications for this visit.     Past Medical History:  Diagnosis Date  . Atrial fibrillation ([place])   . Bipolar affective ([place])   . CHF (congestive heart failure) ([place])   . Chronic back pain   . Diabetes mellitus without complication ([place])   . Hyperlipidemia   . Hypertension   . Pain management   . Renal insufficiency     Past Surgical History:  Procedure Laterality Date  . BREAST REDUCTION SURGERY    . EYE SURGERY Right    cateracts  . HAMMER TOE SURGERY    . LEFT HEART CATH AND CORONARY ANGIOGRAPHY N/A 02/03/2018   Procedure: LEFT HEART CATH AND CORONARY ANGIOGRAPHY;  Surgeon: Martinique, Peter M, MD;  Location: [facility];  Service: Cardiovascular;  Laterality: N/A;  . SHOULDER SURGERY Right   . THIGH SURGERY      Social History   Socioeconomic History  . Marital status: Married    Spouse name: Not on file  . Number of children: 3  . Years of education: 16  . Highest education level: Bachelor's degree (e.g., BA, AB, BS)  Occupational History  . Not on file  Social Needs  . Financial resource strain: Not hard at all  . Food insecurity:    Worry: Never true    Inability: Never true  . Transportation needs:    Medical: No    Non-medical: No  Tobacco Use  . Smoking status: Never Smoker  . Smokeless tobacco: Never  Used  Substance and Sexual Activity  . Alcohol use: No    Alcohol/week: 0.0 standard drinks  . Drug use: No  . Sexual activity: Yes    Partners: Male    Birth control/protection: Post-menopausal  Lifestyle  . Physical activity:    Days per week: 0 days    Minutes per session: 0 min  . Stress: Not at all  Relationships  . Social connections:    Talks on phone: More than three times a week    Gets together: Once a week    Attends religious service: More than 4 times per  year    Active member of club or organization: No    Attends meetings of clubs or organizations: Never    Relationship status: Married  . Intimate partner violence:    Fear of current or ex partner: No    Emotionally abused: No    Physically abused: No    Forced sexual activity: No  Other Topics Concern  . Not on file  Social History Narrative   Lives at home with husband.      Vitals:   03/03/18 1354  BP: 110/64  Pulse: 83  SpO2: 94%  Weight: 208 lb (94.3 kg)  Height: 5\' 2"  (1.575 m)    Wt Readings from Last 3 Encounters:  03/03/18 208 lb (94.3 kg)  03/03/18 206 lb (93.4 kg)  02/17/18 205 lb 6.4 oz (93.2 kg)     PHYSICAL EXAM General: NAD HEENT: Normal. Neck: No JVD, no thyromegaly. Lungs: Diminished breath sounds, no obvious crackles or wheezes. CV: Regular rate and irregular rhythm, normal S1/S2, no S3, no murmur. No pretibial or periankle edema.  No carotid bruit.   Abdomen: Soft, nontender, obese.  Neurologic: Alert and oriented.  Psych: Normal affect. Skin: Normal. Musculoskeletal: No gross deformities.    ECG: Reviewed above under Subjective   Labs: Lab Results  Component Value Date/Time   K 5.3 (H) 02/03/2018 07:43 AM   BUN 17 02/03/2018 07:43 AM   BUN 26 05/27/2017 01:19 PM   CREATININE 1.10 (H) 02/03/2018 07:43 AM   ALT 17 10/16/2017 04:00 PM   TSH 1.922 05/19/2017 01:18 PM   TSH 3.550 01/04/2015 10:28 AM   HGB 13.6 02/03/2018 07:43 AM   HGB 14.2 05/27/2017 01:19 PM       Lipids: Lab Results  Component Value Date/Time   LDLCALC 71 01/04/2015 10:28 AM   LDLDIRECT 62 03/17/2016 04:14 PM   CHOL 163 01/04/2015 10:28 AM   TRIG 249 (H) 01/04/2015 10:28 AM   HDL 42 01/04/2015 10:28 AM       ASSESSMENT AND PLAN:  1. Permanent atrial fibrillation:Symptomatically stable. Continuelong acting diltiazem 120 mg dailyandmetoprolol 125 mg twice daily. Anticoagulated withapixaban.  2. Chronic diastolic heart failure:She appears euvolemic.  LVEDP was elevated at the time of cath.  Currently takes Lasix 20 mg twice daily.I told her she can take Lasix as needed for increased swelling, dyspnea, and weight gain.    3. Hypertension: Controlled. No changes.  4. Hyperlipidemia: Continue Lipitor 40 mg.  5.  Pulmonary hypertension: Aggressive weight loss has been recommended.  She is scheduled for a sleep study.  No role for pulmonary vasodilators.  She is also being treated for moderate persistent asthma.   Disposition: Follow up 6 months   Kate Sable, M.D., F.A.C.C.

## 2018-03-03 NOTE — Patient Instructions (Signed)
Healthy vaginal hygiene practices   -  Avoid sleeper pajamas. Nightgowns allow air to circulate.  Sleep without underpants whenever possible.  -  Wear cotton underpants during the day. Double-rinse underwear after washing to avoid residual irritants. Do not use fabric softeners for underwear and swimsuits.  - Avoid tights, leotards, leggings, "skinny" jeans, and other tight-fitting clothing. Skirts and loose-fitting pants allow air to circulate.  - Avoid pantyliners.  Instead use tampons or cotton pads.  - Daily warm bathing is helpful:     - Soak in clean water (no soap) for 10 to 15 minutes. Adding vinegar or baking soda to the water has not been specifically studied and may not be better than clean water alone.      - Use soap to wash regions other than the genital area just before getting out of the tub. Limit use of any soap on genital areas. Use fragance-free soaps.     - Rinse the genital area well and gently pat dry.  Don't rub.  Hair dryer to assist with drying can be used only if on cool setting.     - Do not use bubble baths or perfumed soaps.  - Do not use any feminine sprays, douches or powders.  These contain chemicals that will irritate the skin.  - If the genital area is tender or swollen, cool compresses may relieve the discomfort. Unscented wet wipes can be used instead of toilet paper for wiping.   - Emollients, such as Vaseline, may help protect skin and can be applied to the irritated area.  - Always remember to wipe front-to-back after bowel movements. Pat dry after urination.  - Do not sit in wet swimsuits for long periods of time after swimming   

## 2018-03-03 NOTE — Patient Instructions (Addendum)
Your physician wants you to follow-up in:6 months  With Dr.Koneswaran You will receive a reminder letter in the mail two months in advance. If you don't receive a letter, please call our office to schedule the follow-up appointment.    Your physician recommends that you continue on your current medications as directed. Please refer to the Current Medication list given to you today.     If you need a refill on your cardiac medications before your next appointment, please call your pharmacy.     No lab work or tests today       Thank you for choosing [facility] !

## 2018-03-03 NOTE — Progress Notes (Signed)
Subjective: CC: vaginal itching PCP: [name], DO Amber Stephenson is a 72 y.o. female presenting to clinic today for:  1. Vaginal itching Patient with several day history of vaginal itching.  She notes that she has had some discharge but nothing that looked white or cheesy.  No foul odors to the discharge.  Denies any dysuria.  No abnormal vaginal bleeding.  No dyspareunia or postcoital bleeding.  She reports that her sugars have been somewhat uncontrolled often running in the 160s.  She is under the care of endocrinology for this.  She has not been on any antibiotics recently.  She states that the vaginal itching seems to be occurring in cycles and she wonders what she can do about this.  She is currently sexually active but denies any concern for STIs.  2.  Abrasion Patient reports that she had an injection performed recently and the bandages left behind skin breakdown.  She currently has scabs over the affected regions.  She denies any purulence or fevers.  She has not been applying anything to the affected regions.   ROS: Per HPI  Allergies  Allergen Reactions  . Ivp Dye [Iodinated Diagnostic Agents] Anaphylaxis and Swelling    Throat closes   Past Medical History:  Diagnosis Date  . Atrial fibrillation ([place])   . Bipolar affective ([place])   . CHF (congestive heart failure) ([place])   . Chronic back pain   . Diabetes mellitus without complication ([place])   . Hyperlipidemia   . Hypertension   . Pain management   . Renal insufficiency     Current Outpatient Medications:  .  apixaban (ELIQUIS) 5 MG TABS tablet, Take 1 tablet (5 mg total) by mouth 2 (two) times daily., Disp: 180 tablet, Rfl: 0 .  ARIPiprazole (ABILIFY) 10 MG tablet, TAKE 3 TABLETS EVERY MORNING (Patient taking differently: Take 30 mg by mouth daily. ), Disp: 270 tablet, Rfl: 2 .  atorvastatin (LIPITOR) 40 MG tablet, TAKE 1 TABLET DAILY, Disp: 90 tablet, Rfl: 0 .  Blood Glucose Monitoring Suppl  (FREESTYLE LITE) DEVI, Use to check blood sugar tid. DX E11.22, Disp: 1 each, Rfl: 0 .  budesonide-formoterol (SYMBICORT) 80-4.5 MCG/ACT inhaler, Inhale 2 puffs into the lungs 2 (two) times daily., Disp: 1 Inhaler, Rfl: 0 .  carbamazepine (CARBATROL) 100 MG 12 hr capsule, Take 1 capsule (100 mg total) by mouth 2 (two) times daily., Disp: 180 capsule, Rfl: 6 .  Cholecalciferol (VITAMIN D3) 1000 UNITS CAPS, Take 1,000 Units by mouth daily., Disp: , Rfl:  .  clotrimazole (LOTRIMIN) 1 % cream, APPLY EXTERNALLY TO THE AFFECTED AREA TWICE DAILY (Patient taking differently: Apply 1 application topically daily. ), Disp: 30 g, Rfl: 2 .  diltiazem (CARDIZEM CD) 120 MG 24 hr capsule, Take 1 capsule (120 mg total) by mouth at bedtime., Disp: 90 capsule, Rfl: 3 .  Dulaglutide (TRULICITY) 1.5 WP/8.0DX SOPN, Inject 1.5 mg into the skin once a week., Disp: 12 pen, Rfl: 3 .  FREESTYLE LITE test strip, USE FOR TESTING SUGAR THREE TIMES DAILY, Disp: 100 each, Rfl: 3 .  furosemide (LASIX) 20 MG tablet, Take 1 tablet (20 mg total) by mouth daily., Disp: 180 tablet, Rfl: 1 .  Insulin Glargine (LANTUS SOLOSTAR) 100 UNIT/ML Solostar Pen, INJECT 60 UNITS UNDER THE SKIN TWICE A DAY, Disp: 90 mL, Rfl: 0 .  Insulin Pen Needle (SURE COMFORT PEN NEEDLES) 32G X 4 MM MISC, Use with insulin pens as directed. Dx E11.9, Disp: 300 each,  Rfl: 11 .  Ipratropium-Albuterol (COMBIVENT RESPIMAT) 20-100 MCG/ACT AERS respimat, Inhale 1 puff into the lungs every 6 (six) hours., Disp: 4 g, Rfl: 2 .  Lancets (FREESTYLE) lancets, Test tid. DX E11.22, Disp: 100 each, Rfl: 5 .  lidocaine (XYLOCAINE) 2 % solution, SWISH AND SPIT ENOUGH TO FILL MOUTH DAILY AS NEEDED FOR MOUTH PAIN, Disp: 100 mL, Rfl: 2 .  LORazepam (ATIVAN) 1 MG tablet, Take 1 tab (1 mg) in the morning and 2 tabs (2 mg ) at bedtime, Disp: 90 tablet, Rfl: 0 .  metoprolol tartrate (LOPRESSOR) 100 MG tablet, TAKE 1 TABLET TWICE A DAY, Disp: 180 tablet, Rfl: 3 .  metoprolol tartrate  (LOPRESSOR) 25 MG tablet, TAKE 1 TABLET TWICE A DAY (TO BE TAKEN ALONG WITH THE LOPRESSOR 100 MG TWICE A DAY) (Patient taking differently: Take 25 mg by mouth 2 (two) times daily. ), Disp: 180 tablet, Rfl: 0 .  naloxone (NARCAN) nasal spray 4 mg/0.1 mL, Place 1 spray into the nose once as needed (opiod overdose)., Disp: , Rfl:  .  oxyCODONE-acetaminophen (PERCOCET/ROXICET) 5-325 MG tablet, Take 1 tablet by mouth every 8 (eight) hours., Disp: , Rfl:  .  pregabalin (LYRICA) 150 MG capsule, Take 1 capsule (150 mg total) by mouth 2 (two) times daily., Disp: 180 capsule, Rfl: 2 .  rizatriptan (MAXALT-MLT) 10 MG disintegrating tablet, DISSOLVE 1 TABLET UNDER THE TONGUE IMMEDIATELY AT SIGN OF HEADACHE. MAY REPEAT DOSE IN 1 HOUR IF NEEDED. MAX OF 3 TABLETS IN 24 HOURS, Disp: 10 tablet, Rfl: 2 .  SUMAtriptan (IMITREX) 100 MG tablet, TAKE 1 TABLET BY MOUTH AT ONSET OF HEADACHE. MAY REPEAT IN 2 HOURS IF HEADACHE PERSISTS OR RECURS.LIMIT 2 PER 24 HOURS, Disp: 10 tablet, Rfl: 1 .  traZODone (DESYREL) 100 MG tablet, Take 3 tablets (300 mg total) by mouth at bedtime., Disp: 90 tablet, Rfl: 2 .  TRICOR 145 MG tablet, TAKE 1 TABLET DAILY, Disp: 90 tablet, Rfl: 0 Social History   Socioeconomic History  . Marital status: Married    Spouse name: Not on file  . Number of children: 3  . Years of education: 16  . Highest education level: Bachelor's degree (e.g., BA, AB, BS)  Occupational History  . Not on file  Social Needs  . Financial resource strain: Not hard at all  . Food insecurity:    Worry: Never true    Inability: Never true  . Transportation needs:    Medical: No    Non-medical: No  Tobacco Use  . Smoking status: Never Smoker  . Smokeless tobacco: Never Used  Substance and Sexual Activity  . Alcohol use: No    Alcohol/week: 0.0 standard drinks  . Drug use: No  . Sexual activity: Yes    Partners: Male    Birth control/protection: Post-menopausal  Lifestyle  . Physical activity:    Days per  week: 0 days    Minutes per session: 0 min  . Stress: Not at all  Relationships  . Social connections:    Talks on phone: More than three times a week    Gets together: Once a week    Attends religious service: More than 4 times per year    Active member of club or organization: No    Attends meetings of clubs or organizations: Never    Relationship status: Married  . Intimate partner violence:    Fear of current or ex partner: No    Emotionally abused: No    Physically abused:  No    Forced sexual activity: No  Other Topics Concern  . Not on file  Social History Narrative   Lives at home with husband.    Family History  Problem Relation Age of Onset  . Diabetes Mother   . Heart disease Mother   . Heart disease Brother   . Heart disease Sister        CABG  . Alcohol abuse Sister   . Mental illness Brother   . Diabetes Brother     Objective: Office vital signs reviewed. BP 100/78   Pulse 72   Temp 97.6 F (36.4 C) (Oral)   Ht 5' (1.524 m)   Wt 206 lb (93.4 kg)   BMI 40.23 kg/m   Physical Examination:  General: Awake, alert, obese, No acute distress GU: external vaginal tissue atrophic, cervix high and only partially visualized 2/2 obesity, no punctate lesions on cervix appreciated, scant yellow/ creamy discharge from cervical os, no bleeding, no cervical motion tenderness, no abdominal/ adnexal masses Skin: dry.  2 abrasions appreciated along the right anterior chest with associated scab formation.  There is minimal erythema at the borders but this appears to be consistent with granulation tissue.  No evidence of infection at this point.  Results for orders placed or performed in visit on 03/03/18 (from the past 24 hour(s))  WET PREP FOR [place], YEAST, CLUE     Status: None   Collection Time: 03/03/18 11:18 AM  Result Value Ref Range   Trichomonas Exam Negative Negative   Yeast Exam Negative Negative   Clue Cell Exam Negative Negative   Narrative   Performed at:   [address] [place], [place]  [id number] Lab Director: [facility], Phone:  [phone]    Assessment/ Plan: 72 y.o. female   1. Vaginal discharge Wet prep essentially negative.  Because she is having symptoms I have gone ahead and proceed with treatment.  I have instructed her to return if symptoms persist.  Could consider referral to GYN at some point.  There is nothing sclerotic about the vaginal exam to suggest lichen sclerosus at this time.  However, if symptoms persist could consider topical corticosteroid trial. - WET PREP FOR TRICH, YEAST, CLUE  2. Abrasion Likely related to adhesive material of the bandages.  There is no evidence of secondary infection at this time.  I have recommended that she apply Vaseline to the affected areas to improve healing.  She will monitor for signs and symptoms of infection and return should she have concern.   Orders Placed This Encounter  Procedures  . WET PREP FOR TRICH, YEAST, CLUE   Meds ordered this encounter  Medications  . fluconazole (DIFLUCAN) 150 MG tablet    Sig: Take 1 tablet (150 mg total) by mouth once for 1 dose. (for yeast infection).  May repeat in 3 days ONCE if you have continued symptoms.    Dispense:  1 tablet    Refill:  [place], [place] [phone]

## 2018-03-04 ENCOUNTER — Other Ambulatory Visit (HOSPITAL_COMMUNITY): Payer: Self-pay | Admitting: Psychiatry

## 2018-03-04 ENCOUNTER — Other Ambulatory Visit: Payer: Self-pay | Admitting: Family Medicine

## 2018-03-04 ENCOUNTER — Other Ambulatory Visit: Payer: Self-pay | Admitting: Cardiovascular Disease

## 2018-03-04 ENCOUNTER — Ambulatory Visit (HOSPITAL_COMMUNITY): Payer: Self-pay | Admitting: Psychiatry

## 2018-03-04 MED ORDER — BUDESONIDE-FORMOTEROL FUMARATE 80-4.5 MCG/ACT IN AERO: 2.0000 | INHALATION_SPRAY | Freq: Two times a day (BID) | RESPIRATORY_TRACT | 0 refills | Status: DC

## 2018-03-04 NOTE — Telephone Encounter (Signed)
Patient aware that samples placed up front.

## 2018-03-05 ENCOUNTER — Encounter (HOSPITAL_COMMUNITY): Payer: Self-pay

## 2018-03-05 ENCOUNTER — Other Ambulatory Visit: Payer: Self-pay

## 2018-03-05 ENCOUNTER — Emergency Department (HOSPITAL_COMMUNITY): Payer: Medicare Other

## 2018-03-05 ENCOUNTER — Observation Stay (HOSPITAL_COMMUNITY)
Admission: EM | Admit: 2018-03-05 | Discharge: 2018-03-08 | Disposition: A | Discharge status: Home Health | Payer: Medicare Other | Attending: Internal Medicine | Admitting: Internal Medicine

## 2018-03-05 DIAGNOSIS — M25519 Pain in unspecified shoulder: Secondary | ICD-10-CM | POA: Diagnosis not present

## 2018-03-05 DIAGNOSIS — E119 Type 2 diabetes mellitus without complications: Secondary | ICD-10-CM

## 2018-03-05 DIAGNOSIS — Z79899 Other long term (current) drug therapy: Secondary | ICD-10-CM | POA: Diagnosis not present

## 2018-03-05 DIAGNOSIS — S2239XA Fracture of one rib, unspecified side, initial encounter for closed fracture: Secondary | ICD-10-CM

## 2018-03-05 DIAGNOSIS — I482 Chronic atrial fibrillation, unspecified: Secondary | ICD-10-CM | POA: Diagnosis present

## 2018-03-05 DIAGNOSIS — Z7901 Long term (current) use of anticoagulants: Secondary | ICD-10-CM | POA: Insufficient documentation

## 2018-03-05 DIAGNOSIS — E785 Hyperlipidemia, unspecified: Secondary | ICD-10-CM | POA: Diagnosis not present

## 2018-03-05 DIAGNOSIS — Z6837 Body mass index (BMI) 37.0-37.9, adult: Secondary | ICD-10-CM | POA: Diagnosis not present

## 2018-03-05 DIAGNOSIS — S59902A Unspecified injury of left elbow, initial encounter: Secondary | ICD-10-CM | POA: Diagnosis not present

## 2018-03-05 DIAGNOSIS — J4520 Mild intermittent asthma, uncomplicated: Secondary | ICD-10-CM | POA: Diagnosis not present

## 2018-03-05 DIAGNOSIS — G43109 Migraine with aura, not intractable, without status migrainosus: Secondary | ICD-10-CM | POA: Insufficient documentation

## 2018-03-05 DIAGNOSIS — S2242XA Multiple fractures of ribs, left side, initial encounter for closed fracture: Secondary | ICD-10-CM | POA: Diagnosis not present

## 2018-03-05 DIAGNOSIS — I130 Hypertensive heart and chronic kidney disease with heart failure and stage 1 through stage 4 chronic kidney disease, or unspecified chronic kidney disease: Secondary | ICD-10-CM | POA: Diagnosis not present

## 2018-03-05 DIAGNOSIS — S42302A Unspecified fracture of shaft of humerus, left arm, initial encounter for closed fracture: Secondary | ICD-10-CM | POA: Diagnosis present

## 2018-03-05 DIAGNOSIS — W1809XA Striking against other object with subsequent fall, initial encounter: Secondary | ICD-10-CM | POA: Insufficient documentation

## 2018-03-05 DIAGNOSIS — S42202A Unspecified fracture of upper end of left humerus, initial encounter for closed fracture: Secondary | ICD-10-CM

## 2018-03-05 DIAGNOSIS — E1122 Type 2 diabetes mellitus with diabetic chronic kidney disease: Secondary | ICD-10-CM | POA: Diagnosis not present

## 2018-03-05 DIAGNOSIS — E1140 Type 2 diabetes mellitus with diabetic neuropathy, unspecified: Secondary | ICD-10-CM | POA: Insufficient documentation

## 2018-03-05 DIAGNOSIS — E1159 Type 2 diabetes mellitus with other circulatory complications: Secondary | ICD-10-CM | POA: Diagnosis present

## 2018-03-05 DIAGNOSIS — N183 Chronic kidney disease, stage 3 (moderate): Secondary | ICD-10-CM | POA: Insufficient documentation

## 2018-03-05 DIAGNOSIS — I10 Essential (primary) hypertension: Secondary | ICD-10-CM | POA: Diagnosis not present

## 2018-03-05 DIAGNOSIS — S42292A Other displaced fracture of upper end of left humerus, initial encounter for closed fracture: Secondary | ICD-10-CM | POA: Diagnosis not present

## 2018-03-05 DIAGNOSIS — I4821 Permanent atrial fibrillation: Secondary | ICD-10-CM | POA: Insufficient documentation

## 2018-03-05 DIAGNOSIS — R079 Chest pain, unspecified: Secondary | ICD-10-CM | POA: Diagnosis not present

## 2018-03-05 DIAGNOSIS — S2249XA Multiple fractures of ribs, unspecified side, initial encounter for closed fracture: Secondary | ICD-10-CM

## 2018-03-05 DIAGNOSIS — M549 Dorsalgia, unspecified: Secondary | ICD-10-CM | POA: Diagnosis not present

## 2018-03-05 DIAGNOSIS — S42295A Other nondisplaced fracture of upper end of left humerus, initial encounter for closed fracture: Secondary | ICD-10-CM | POA: Diagnosis not present

## 2018-03-05 DIAGNOSIS — G8929 Other chronic pain: Secondary | ICD-10-CM | POA: Diagnosis not present

## 2018-03-05 DIAGNOSIS — Z794 Long term (current) use of insulin: Secondary | ICD-10-CM | POA: Insufficient documentation

## 2018-03-05 DIAGNOSIS — I5032 Chronic diastolic (congestive) heart failure: Secondary | ICD-10-CM | POA: Insufficient documentation

## 2018-03-05 DIAGNOSIS — R52 Pain, unspecified: Secondary | ICD-10-CM | POA: Diagnosis not present

## 2018-03-05 DIAGNOSIS — W19XXXA Unspecified fall, initial encounter: Secondary | ICD-10-CM | POA: Diagnosis present

## 2018-03-05 DIAGNOSIS — E1165 Type 2 diabetes mellitus with hyperglycemia: Secondary | ICD-10-CM

## 2018-03-05 LAB — CBC WITH DIFFERENTIAL/PLATELET
Abs Immature Granulocytes: 0.1 10*3/uL (ref 0.0–0.1)
Basophils Absolute: 0 10*3/uL (ref 0.0–0.1)
Basophils Relative: 0 %
Eosinophils Absolute: 0.1 10*3/uL (ref 0.0–0.7)
Eosinophils Relative: 1 %
HCT: 44 % (ref 36.0–46.0)
Hemoglobin: 14.2 g/dL (ref 12.0–15.0)
Immature Granulocytes: 0 %
Lymphocytes Relative: 16 %
Lymphs Abs: 1.9 10*3/uL (ref 0.7–4.0)
MCH: 29.7 pg (ref 26.0–34.0)
MCHC: 32.3 g/dL (ref 30.0–36.0)
MCV: 92.1 fL (ref 78.0–100.0)
Monocytes Absolute: 0.7 10*3/uL (ref 0.1–1.0)
Monocytes Relative: 6 %
Neutro Abs: 8.8 10*3/uL — ABNORMAL HIGH (ref 1.7–7.7)
Neutrophils Relative %: 77 %
Platelets: 185 10*3/uL (ref 150–400)
RBC: 4.78 MIL/uL (ref 3.87–5.11)
RDW: 13 % (ref 11.5–15.5)
WBC: 11.5 10*3/uL — ABNORMAL HIGH (ref 4.0–10.5)

## 2018-03-05 LAB — BASIC METABOLIC PANEL
Anion gap: 8 (ref 5–15)
BUN: 14 mg/dL (ref 8–23)
CO2: 28 mmol/L (ref 22–32)
Calcium: 8.9 mg/dL (ref 8.9–10.3)
Chloride: 105 mmol/L (ref 98–111)
Creatinine, Ser: 0.95 mg/dL (ref 0.44–1.00)
GFR calc Af Amer: >60 mL/min (ref >60)
GFR calc non Af Amer: 59 mL/min — ABNORMAL LOW (ref >60)
Glucose, Bld: 203 mg/dL — ABNORMAL HIGH (ref 70–99)
Potassium: 4 mmol/L (ref 3.5–5.1)
Sodium: 141 mmol/L (ref 135–145)

## 2018-03-05 MED ORDER — DIPHENHYDRAMINE HCL 50 MG/ML IJ SOLN
50.0000 mg | Freq: Once | INTRAMUSCULAR | Status: AC
  Administered 2018-03-06: 50 mg via INTRAVENOUS
  Filled 2018-03-05: qty 1

## 2018-03-05 MED ORDER — HYDROMORPHONE HCL 1 MG/ML IJ SOLN
1.0000 mg | Freq: Once | INTRAMUSCULAR | Status: AC
  Administered 2018-03-05: 1 mg via INTRAVENOUS
  Filled 2018-03-05: qty 1

## 2018-03-05 MED ORDER — HYDROCORTISONE NA SUCCINATE PF 250 MG IJ SOLR
200.0000 mg | Freq: Once | INTRAMUSCULAR | Status: AC
  Administered 2018-03-05: 200 mg via INTRAVENOUS
  Filled 2018-03-05: qty 200

## 2018-03-05 MED ORDER — DIPHENHYDRAMINE HCL 25 MG PO CAPS: 50.0000 mg | ORAL_CAPSULE | Freq: Once | ORAL | Status: AC

## 2018-03-05 NOTE — ED Provider Notes (Signed)
Care assumed from [name] Gambler, MD.  Please see his full H&P.  In short,  Amber Stephenson is a 72 y.o. female presents after mechanical fall without hitting her head or loss of consciousness.  Patient landed with her arm up and is having left shoulder, upper arm and left rib pain.  No shortness of breath or abdominal pain.  Initial work-up revealed proximal humerus fracture and fracture of ribs 7 through 10.  Dr. Verta Ellen consulted with trauma, Dr. Ninfa Linden who reports a trauma will consult but recommended hospitalist mission due to patient's long and complicated medical history.  Orthopedics, Dr. Stann Mainland, was also consulted who will evaluate the patient in the morning.  Patient has had Dilaudid IV x3 doses without significant relief.  Physical Exam  BP 124/86   Pulse 95   Temp 98.4 F (36.9 C) (Oral)   Resp 16   Ht 5\' 2"  (1.575 m)   Wt 94.3 kg   SpO2 100%   BMI 38.04 kg/m   Physical Exam  Constitutional: She appears well-developed and well-nourished. No distress.  HENT:  Head: Normocephalic.  Eyes: Conjunctivae are normal. No scleral icterus.  Neck: Normal range of motion.  Cardiovascular: Normal rate and intact distal pulses.  Pulmonary/Chest: Effort normal.  Musculoskeletal: Normal range of motion.  Neurological: She is alert.  Skin: Skin is warm and dry.  Nursing note and vitals reviewed.   ED Course/Procedures   Clinical Course as of Mar 07 131  Fri Mar 05, 2018  2210 Plan: Patient pending lab work and CT scan of her chest as recommended by Dr. Ninfa Linden.  She will need hospitalist admission when her labs result.   [HM]  Sat Mar 06, 2018  0022 Mild leukocytosis noted  WBC(!): 11.5 [HM]  0109 Pt continues to have severe pain.  Dilaudid reordered   [HM]    Clinical Course User Index [HM] Cyndi Montejano, Gwenlyn Perking    Procedures  MDM   Patient with mechanical fall and closed fracture of the proximal left humerus along with multiple left rib fractures.  Her pain  has been very difficult to control here in the emergency department.  Patient discussed with and evaluated by trauma who recommends hospitalist admission.  Labs are reassuring with only mild leukocytosis.  Dr. Ninfa Linden did recommend CT scan of her chest.  This has been ordered however patient has 4-hour prep due to contrast allergy.  1:33 AM Discussed with Dr. Alcario Drought who will admit.   Closed fracture of proximal end of left humerus, unspecified fracture morphology, initial encounter  Closed fracture of multiple ribs of left side, initial encounter  Rib fractures - Plan: DG CHEST PORT 1 VIEW, DG CHEST PORT 1 VIEW      Paz Fuentes, Gwenlyn Perking 03/06/18 0133    [name] Gambler, MD 03/06/18 1215

## 2018-03-05 NOTE — Consult Note (Signed)
ORTHOPAEDIC CONSULTATION  REQUESTING PHYSICIAN: No att. providers found  PCP:  [name], DO  Chief Complaint: Fall, left shoulder pain  HPI: Amber Stephenson is a 72 y.o. female who complains of left shoulder pain and left elbow pain following a ground-level fall at home prior to arrival.  She states she was in her normal state of health when she tripped over a chair at her home and landed on her left side.  Now she is complaining of left-sided chest wall pain as well as left shoulder and elbow pain.  She was evaluated in the emergency department and found to have left proximal humerus fracture is essentially nondisplaced as well as some nondisplaced rib fractures in the left.  She does have past medical history significant for congestive heart failure as well as diabetes and atrial fibrillation on chronic anticoagulation with Eliquis.  She currently denies any numbness or tingling.  She denies smoking.  Past Medical History:  Diagnosis Date  . Atrial fibrillation ([place])   . Bipolar affective ([place])   . CHF (congestive heart failure) ([place])   . Chronic back pain   . Diabetes mellitus without complication ([place])   . Hyperlipidemia   . Hypertension   . Pain management   . Renal insufficiency    Past Surgical History:  Procedure Laterality Date  . BREAST REDUCTION SURGERY    . EYE SURGERY Right    cateracts  . HAMMER TOE SURGERY    . LEFT HEART CATH AND CORONARY ANGIOGRAPHY N/A 02/03/2018   Procedure: LEFT HEART CATH AND CORONARY ANGIOGRAPHY;  Surgeon: Martinique, Peter M, MD;  Location: [facility];  Service: Cardiovascular;  Laterality: N/A;  . SHOULDER SURGERY Right   . THIGH SURGERY     Social History   Socioeconomic History  . Marital status: Married    Spouse name: Not on file  . Number of children: 3  . Years of education: 16  . Highest education level: Bachelor's degree (e.g., BA, AB, BS)  Occupational History  . Not on file  Social Needs  . Financial  resource strain: Not hard at all  . Food insecurity:    Worry: Never true    Inability: Never true  . Transportation needs:    Medical: No    Non-medical: No  Tobacco Use  . Smoking status: Never Smoker  . Smokeless tobacco: Never Used  Substance and Sexual Activity  . Alcohol use: No    Alcohol/week: 0.0 standard drinks  . Drug use: No  . Sexual activity: Yes    Partners: Male    Birth control/protection: Post-menopausal  Lifestyle  . Physical activity:    Days per week: 0 days    Minutes per session: 0 min  . Stress: Not at all  Relationships  . Social connections:    Talks on phone: More than three times a week    Gets together: Once a week    Attends religious service: More than 4 times per year    Active member of club or organization: No    Attends meetings of clubs or organizations: Never    Relationship status: Married  Other Topics Concern  . Not on file  Social History Narrative   Lives at home with husband.    Family History  Problem Relation Age of Onset  . Diabetes Mother   . Heart disease Mother   . Heart disease Brother   . Heart disease Sister  CABG  . Alcohol abuse Sister   . Mental illness Brother   . Diabetes Brother    Allergies  Allergen Reactions  . Ivp Dye [Iodinated Diagnostic Agents] Anaphylaxis and Swelling    Throat closes   Prior to Admission medications   Medication Sig Start Date End Date Taking? Authorizing Provider  apixaban (ELIQUIS) 5 MG TABS tablet Take 1 tablet (5 mg total) by mouth 2 (two) times daily. 12/10/17  Yes Timmothy Euler, MD  ARIPiprazole (ABILIFY) 10 MG tablet TAKE 3 TABLETS EVERY MORNING Patient taking differently: Take 30 mg by mouth daily.  07/15/17  Yes Plovsky, Berneta Sages, MD  atorvastatin (LIPITOR) 40 MG tablet TAKE 1 TABLET DAILY Patient taking differently: Take 40 mg by mouth daily at 6 PM.  01/05/18  Yes Gottschalk, Ashly M, DO  budesonide-formoterol (SYMBICORT) 80-4.5 MCG/ACT inhaler Inhale 2 puffs  into the lungs 2 (two) times daily. 03/04/18  Yes Ronnie Doss M, DO  Cholecalciferol (VITAMIN D3) 1000 UNITS CAPS Take 1,000 Units by mouth daily.   Yes [provider]  diltiazem (CARDIZEM CD) 120 MG 24 hr capsule Take 1 capsule (120 mg total) by mouth at bedtime. 10/29/17  Yes Herminio Commons, MD  Dulaglutide (TRULICITY) 1.5 KG/2.5KY SOPN Inject 1.5 mg into the skin once a week. 08/20/17  Yes Timmothy Euler, MD  furosemide (LASIX) 20 MG tablet Take 1 tablet (20 mg total) by mouth 2 (two) times daily. 03/03/18  Yes Herminio Commons, MD  Insulin Glargine (LANTUS SOLOSTAR) 100 UNIT/ML Solostar Pen INJECT 60 UNITS UNDER THE SKIN TWICE A DAY Patient taking differently: Inject 60 Units into the skin 2 (two) times daily.  01/25/18  Yes Herminio Commons, MD  LORazepam (ATIVAN) 1 MG tablet Take 1 tab (1 mg) in the morning and 2 tabs (2 mg ) at bedtime Patient taking differently: Take 1-2 mg by mouth See admin instructions. Take 1 tab (1 mg) in the morning and 2 tabs (2 mg ) at bedtime 02/10/18  Yes Plovsky, Berneta Sages, MD  metoprolol tartrate (LOPRESSOR) 100 MG tablet TAKE 1 TABLET TWICE A DAY Patient taking differently: Take 100 mg by mouth 2 (two) times daily. Take with metoprolol 25 mg to equal 125 mg 05/01/17  Yes Herminio Commons, MD  metoprolol tartrate (LOPRESSOR) 25 MG tablet TAKE 1 TABLET TWICE A DAY (TO BE TAKEN ALONG WITH THE LOPRESSOR 100 MG TWICE A DAY) Patient taking differently: Take 25 mg by mouth 2 (two) times daily. Take with metoprolol 100 mg to equal 125 mg 03/04/18  Yes Herminio Commons, MD  naloxone [facility]) nasal spray 4 mg/0.1 mL Place 1 spray into the nose once as needed (opiod overdose).   Yes [provider]  oxyCODONE-acetaminophen (PERCOCET/ROXICET) 5-325 MG tablet Take 1 tablet by mouth every 8 (eight) hours. 08/07/17  Yes [provider]  pregabalin (LYRICA) 150 MG capsule Take 1 capsule (150 mg total) by mouth 2 (two) times daily.  10/09/17 10/09/18 Yes Plovsky, Berneta Sages, MD  rizatriptan (MAXALT-MLT) 10 MG disintegrating tablet DISSOLVE 1 TABLET UNDER THE TONGUE IMMEDIATELY AT SIGN OF HEADACHE. MAY REPEAT DOSE IN 1 HOUR IF NEEDED. MAX OF 3 TABLETS IN 24 HOURS Patient taking differently: Take 10 mg by mouth as needed for migraine.  01/13/18  Yes Martin, Mary-Margaret, FNP  SUMAtriptan (IMITREX) 100 MG tablet TAKE 1 TABLET BY MOUTH AT ONSET OF HEADACHE. MAY REPEAT IN 2 HOURS IF HEADACHE PERSISTS OR RECURS.LIMIT 2 PER 24 HOURS Patient taking differently: Take 100  mg by mouth every 2 (two) hours as needed for migraine or headache.  02/09/18  Yes Gottschalk, Ashly M, DO  traZODone (DESYREL) 100 MG tablet Take 3 tablets (300 mg total) by mouth at bedtime. 01/13/18  Yes Plovsky, Berneta Sages, MD  TRICOR 145 MG tablet TAKE 1 TABLET DAILY Patient taking differently: Take 145 mg by mouth daily.  12/10/17  Yes Timmothy Euler, MD  Blood Glucose Monitoring Suppl (FREESTYLE LITE) DEVI Use to check blood sugar tid. DX E11.22 04/04/15   Timmothy Euler, MD  carbamazepine (CARBATROL) 100 MG 12 hr capsule Take 1 capsule (100 mg total) by mouth 2 (two) times daily. Patient not taking: Reported on 03/05/2018 11/06/17 11/06/18  Norma Fredrickson, MD  clotrimazole (LOTRIMIN) 1 % cream APPLY EXTERNALLY TO THE AFFECTED AREA TWICE DAILY Patient not taking: No sig reported 12/24/17   Ronnie Doss M, DO  FREESTYLE LITE test strip USE FOR TESTING SUGAR THREE TIMES DAILY 01/19/17   Timmothy Euler, MD  Insulin Pen Needle (SURE COMFORT PEN NEEDLES) 32G X 4 MM MISC Use with insulin pens as directed. Dx E11.9 08/16/15   Timmothy Euler, MD  Ipratropium-Albuterol (COMBIVENT RESPIMAT) 20-100 MCG/ACT AERS respimat Inhale 1 puff into the lungs every 6 (six) hours. Patient not taking: Reported on 03/05/2018 10/20/17   Timmothy Euler, MD  Lancets (FREESTYLE) lancets Test tid. DX E11.22 08/12/17   Timmothy Euler, MD  lidocaine (XYLOCAINE) 2 % solution SWISH AND SPIT  ENOUGH TO FILL MOUTH DAILY AS NEEDED FOR MOUTH PAIN Patient not taking: Reported on 03/05/2018 12/15/17   Timmothy Euler, MD   Dg Ribs Unilateral W/chest Left  Result Date: 03/05/2018 CLINICAL DATA:  Fall EXAM: LEFT RIBS AND CHEST - 3+ VIEW COMPARISON:  10/14/2017 FINDINGS: Single-view chest demonstrates cardiomegaly. Dense mitral calcification. No pleural effusion or pneumothorax. Left proximal humerus fracture. Left rib series demonstrates acute left seventh through 10 rib fractures. IMPRESSION: 1. Negative for pneumothorax or pleural effusion.  Cardiomegaly 2. Acute left seventh through tenth rib fractures 3. Acute left proximal humerus fracture Electronically Signed   By: Donavan Foil M.D.   On: 03/05/2018 20:07   Dg Elbow Complete Left  Result Date: 03/05/2018 CLINICAL DATA:  Fall EXAM: LEFT ELBOW - COMPLETE 3+ VIEW COMPARISON:  None. FINDINGS: There is no evidence of fracture, dislocation, or joint effusion. There is no evidence of arthropathy or other focal bone abnormality. Soft tissues are unremarkable. IMPRESSION: Negative. Electronically Signed   By: Donavan Foil M.D.   On: 03/05/2018 20:08   Dg Shoulder Left  Result Date: 03/05/2018 CLINICAL DATA:  Fall EXAM: LEFT SHOULDER - 2+ VIEW COMPARISON:  None. FINDINGS: AC joint is intact. Acute mildly impacted fracture at the left humeral neck. No dislocation of the humeral head. IMPRESSION: Acute slightly impacted and displaced left humeral neck fracture Electronically Signed   By: Donavan Foil M.D.   On: 03/05/2018 20:10   Dg Humerus Left  Result Date: 03/05/2018 CLINICAL DATA:  Fall EXAM: LEFT HUMERUS - 2+ VIEW COMPARISON:  None. FINDINGS: AC joint is intact. Acute mildly comminuted left humeral neck fracture with mild posterior angulation of distal fracture fragment and about 1/3 bone with of displacement toward the midline. IMPRESSION: Acute comminuted impacted and slightly displaced left proximal humerus fracture Electronically Signed    By: Donavan Foil M.D.   On: 03/05/2018 20:09    Positive ROS: All other systems have been reviewed and were otherwise negative with the exception of those mentioned  in the HPI and as above.  Physical Exam: General: Alert, no acute distress Cardiovascular: No pedal edema Respiratory: No cyanosis, no use of accessory musculature GI: No organomegaly, abdomen is soft and non-tender Skin: No lesions in the area of chief complaint Neurologic: Sensation intact distally Psychiatric: Patient is competent for consent with normal mood and affect Lymphatic: No axillary or cervical lymphadenopathy  MUSCULOSKELETAL:  Left upper extremity:  No obvious deformities.  She has tenderness about the shoulder girdle proximally.  At the elbow she has no focal tenderness but generally sore around the entire elbow.  She can range the elbow from 0 to 140 degrees.  Distally of the hand and wrist nontender.  Neurovascular intact at the entire left upper extremity.  Assessment: Closed, nondisplaced left proximal humerus fracture nondisplaced components at the surgical neck as well as greater tuberosity.    Plan: -Our plan will be for conservative management of this left proximal humerus fracture.  She will be placed in a sling in a nonweightbearing.  We will get new x-rays in 2 weeks.  At that point time she will likely go to progress to passive range of motion exercises with a physical therapist.  She is okay to remove the sling at this point in time for full elbow, hand and wrist range of motion as tolerated but should be nonweightbearing to the left arm. -She does tell me that she has establish care with my partner Dr. Netta Cedars, and as such I would have her follow-up with him in about 2 weeks in the office. -We will sign off at this time.  Please call with questions.    Nicholes Stairs, MD Cell [phone]    03/05/2018 11:19 PM

## 2018-03-05 NOTE — ED Notes (Signed)
Attempted to walk patient; was able to walk with one person assist, but did not feel "safe". Md notified.

## 2018-03-05 NOTE — ED Provider Notes (Addendum)
[facility] EMERGENCY DEPARTMENT Provider Note   CSN: [id number] Arrival date & time: 03/05/18  1821     History   Chief Complaint Chief Complaint  Patient presents with  . Fall  . Shoulder Pain    HPI Amber Stephenson is a 72 y.o. female.  HPI  72 year old female presents after a fall.  She was walking and tripped over a chair.  She did not hit her head or lose consciousness.  Landed with her arm up overhead and hit the shoulder on the ground.  Is having left shoulder/upper arm pain and left rib pain.  No shortness of breath or abdominal pain.  No headache or neck pain.  Pain is currently severe.  Has not been given any pain medicine by EMS.  No weakness or numbness.  Past Medical History:  Diagnosis Date  . Atrial fibrillation ([place])   . Bipolar affective ([place])   . CHF (congestive heart failure) ([place])   . Chronic back pain   . Diabetes mellitus without complication ([place])   . Hyperlipidemia   . Hypertension   . Pain management   . Renal insufficiency     Patient Active Problem List   Diagnosis Date Noted  . Mild intermittent asthma without complication 74/25/9563  . Cardiomegaly 01/12/2018  . NAFL (nonalcoholic fatty liver) 87/56/4332  . Age-related osteoporosis without current pathological fracture 12/15/2017  . Asthma 11/12/2017  . Atrial fibrillation with RVR ([place]) 05/19/2017  . Microscopic colitis 05/19/2017  . Hypotension 05/19/2017  . Fibromyalgia 03/19/2017  . Healthcare maintenance 02/12/2016  . Migraine headache with aura 02/12/2016  . Diabetic neuropathy ([place]) 02/06/2016  . Depression   . Herpes genitalis in women 07/16/2015  . Chronic anticoagulation 05/30/2015  . Family history of coronary artery disease in sister 05/30/2015  . Chronic diastolic CHF (congestive heart failure) ([place]) 05/30/2015  . Chest pain with moderate risk of acute coronary syndrome 05/29/2015  . Essential hypertension   . RLS (restless legs syndrome)  04/27/2015  . Lipoma 02/08/2015  . Bipolar disorder ([place]) 01/23/2015  . Insomnia 01/23/2015  . CKD stage 3 due to type 2 diabetes mellitus ([place]) 01/05/2015  . Chronic back pain 01/04/2015  . Permanent atrial fibrillation 01/04/2015  . Diabetes mellitus with stage 2 chronic kidney disease ([place])   . Hyperlipidemia     Past Surgical History:  Procedure Laterality Date  . BREAST REDUCTION SURGERY    . EYE SURGERY Right    cateracts  . HAMMER TOE SURGERY    . LEFT HEART CATH AND CORONARY ANGIOGRAPHY N/A 02/03/2018   Procedure: LEFT HEART CATH AND CORONARY ANGIOGRAPHY;  Surgeon: Martinique, Peter M, MD;  Location: [facility];  Service: Cardiovascular;  Laterality: N/A;  . SHOULDER SURGERY Right   . THIGH SURGERY       OB History   None      Home Medications    Prior to Admission medications   Medication Sig Start Date End Date Taking? Authorizing Provider  apixaban (ELIQUIS) 5 MG TABS tablet Take 1 tablet (5 mg total) by mouth 2 (two) times daily. 12/10/17  Yes Timmothy Euler, MD  ARIPiprazole (ABILIFY) 10 MG tablet TAKE 3 TABLETS EVERY MORNING Patient taking differently: Take 30 mg by mouth daily.  07/15/17  Yes Plovsky, Berneta Sages, MD  atorvastatin (LIPITOR) 40 MG tablet TAKE 1 TABLET DAILY Patient taking differently: Take 40 mg by mouth daily at 6 PM.  01/05/18  Yes Gottschalk, Ashly M, DO  budesonide-formoterol (  SYMBICORT) 80-4.5 MCG/ACT inhaler Inhale 2 puffs into the lungs 2 (two) times daily. 03/04/18  Yes Ronnie Doss M, DO  Cholecalciferol (VITAMIN D3) 1000 UNITS CAPS Take 1,000 Units by mouth daily.   Yes [provider]  diltiazem (CARDIZEM CD) 120 MG 24 hr capsule Take 1 capsule (120 mg total) by mouth at bedtime. 10/29/17  Yes Herminio Commons, MD  Dulaglutide (TRULICITY) 1.5 HM/0.9OB SOPN Inject 1.5 mg into the skin once a week. 08/20/17  Yes Timmothy Euler, MD  furosemide (LASIX) 20 MG tablet Take 1 tablet (20 mg total) by mouth 2 (two) times daily.  03/03/18  Yes Herminio Commons, MD  Insulin Glargine (LANTUS SOLOSTAR) 100 UNIT/ML Solostar Pen INJECT 60 UNITS UNDER THE SKIN TWICE A DAY Patient taking differently: Inject 60 Units into the skin 2 (two) times daily.  01/25/18  Yes Herminio Commons, MD  LORazepam (ATIVAN) 1 MG tablet Take 1 tab (1 mg) in the morning and 2 tabs (2 mg ) at bedtime Patient taking differently: Take 1-2 mg by mouth See admin instructions. Take 1 tab (1 mg) in the morning and 2 tabs (2 mg ) at bedtime 02/10/18  Yes Plovsky, Berneta Sages, MD  metoprolol tartrate (LOPRESSOR) 100 MG tablet TAKE 1 TABLET TWICE A DAY Patient taking differently: Take 100 mg by mouth 2 (two) times daily. Take with metoprolol 25 mg to equal 125 mg 05/01/17  Yes Herminio Commons, MD  metoprolol tartrate (LOPRESSOR) 25 MG tablet TAKE 1 TABLET TWICE A DAY (TO BE TAKEN ALONG WITH THE LOPRESSOR 100 MG TWICE A DAY) Patient taking differently: Take 25 mg by mouth 2 (two) times daily. Take with metoprolol 100 mg to equal 125 mg 03/04/18  Yes Herminio Commons, MD  naloxone [facility]) nasal spray 4 mg/0.1 mL Place 1 spray into the nose once as needed (opiod overdose).   Yes [provider]  oxyCODONE-acetaminophen (PERCOCET/ROXICET) 5-325 MG tablet Take 1 tablet by mouth every 8 (eight) hours. 08/07/17  Yes [provider]  pregabalin (LYRICA) 150 MG capsule Take 1 capsule (150 mg total) by mouth 2 (two) times daily. 10/09/17 10/09/18 Yes Plovsky, Berneta Sages, MD  rizatriptan (MAXALT-MLT) 10 MG disintegrating tablet DISSOLVE 1 TABLET UNDER THE TONGUE IMMEDIATELY AT SIGN OF HEADACHE. MAY REPEAT DOSE IN 1 HOUR IF NEEDED. MAX OF 3 TABLETS IN 24 HOURS Patient taking differently: Take 10 mg by mouth as needed for migraine.  01/13/18  Yes Martin, Mary-Margaret, FNP  SUMAtriptan (IMITREX) 100 MG tablet TAKE 1 TABLET BY MOUTH AT ONSET OF HEADACHE. MAY REPEAT IN 2 HOURS IF HEADACHE PERSISTS OR RECURS.LIMIT 2 PER 24 HOURS Patient taking differently: Take  100 mg by mouth every 2 (two) hours as needed for migraine or headache.  02/09/18  Yes Gottschalk, Ashly M, DO  traZODone (DESYREL) 100 MG tablet Take 3 tablets (300 mg total) by mouth at bedtime. 01/13/18  Yes Plovsky, Berneta Sages, MD  TRICOR 145 MG tablet TAKE 1 TABLET DAILY Patient taking differently: Take 145 mg by mouth daily.  12/10/17  Yes Timmothy Euler, MD  Blood Glucose Monitoring Suppl (FREESTYLE LITE) DEVI Use to check blood sugar tid. DX E11.22 04/04/15   Timmothy Euler, MD  carbamazepine (CARBATROL) 100 MG 12 hr capsule Take 1 capsule (100 mg total) by mouth 2 (two) times daily. Patient not taking: Reported on 03/05/2018 11/06/17 11/06/18  Norma Fredrickson, MD  clotrimazole (LOTRIMIN) 1 % cream APPLY EXTERNALLY TO THE AFFECTED AREA TWICE DAILY Patient not taking:  No sig reported 12/24/17   Ronnie Doss M, DO  FREESTYLE LITE test strip USE FOR TESTING SUGAR THREE TIMES DAILY 01/19/17   Timmothy Euler, MD  Insulin Pen Needle (SURE COMFORT PEN NEEDLES) 32G X 4 MM MISC Use with insulin pens as directed. Dx E11.9 08/16/15   Timmothy Euler, MD  Ipratropium-Albuterol (COMBIVENT RESPIMAT) 20-100 MCG/ACT AERS respimat Inhale 1 puff into the lungs every 6 (six) hours. Patient not taking: Reported on 03/05/2018 10/20/17   Timmothy Euler, MD  Lancets (FREESTYLE) lancets Test tid. DX E11.22 08/12/17   Timmothy Euler, MD  lidocaine (XYLOCAINE) 2 % solution SWISH AND SPIT ENOUGH TO FILL MOUTH DAILY AS NEEDED FOR MOUTH PAIN Patient not taking: Reported on 03/05/2018 12/15/17   Timmothy Euler, MD    Family History Family History  Problem Relation Age of Onset  . Diabetes Mother   . Heart disease Mother   . Heart disease Brother   . Heart disease Sister        CABG  . Alcohol abuse Sister   . Mental illness Brother   . Diabetes Brother     Social History Social History   Tobacco Use  . Smoking status: Never Smoker  . Smokeless tobacco: Never Used  Substance Use Topics  .  Alcohol use: No    Alcohol/week: 0.0 standard drinks  . Drug use: No     Allergies   Ivp dye [iodinated diagnostic agents]   Review of Systems Review of Systems  Respiratory: Negative for shortness of breath.   Cardiovascular: Positive for chest pain.  Gastrointestinal: Negative for abdominal pain.  Musculoskeletal: Positive for arthralgias.  Neurological: Negative for weakness, numbness and headaches.     Physical Exam Updated Vital Signs BP 124/86   Pulse 95   Temp 98.4 F (36.9 C) (Oral)   Resp 16   Ht 5\' 2"  (1.575 m)   Wt 94.3 kg   SpO2 100%   BMI 38.04 kg/m   Physical Exam  Constitutional: She is oriented to person, place, and time. She appears well-developed and well-nourished. She appears distressed (in pain).  Morbidly obese  HENT:  Head: Normocephalic and atraumatic.  Right Ear: External ear normal.  Left Ear: External ear normal.  Nose: Nose normal.  Eyes: Right eye exhibits no discharge. Left eye exhibits no discharge.  Cardiovascular: Normal rate, regular rhythm and normal heart sounds.  Pulses:      Radial pulses are 2+ on the left side.  Pulmonary/Chest: Effort normal and breath sounds normal.     She exhibits tenderness (mild).    Abdominal: Soft. There is no tenderness.  Musculoskeletal:       Left shoulder: She exhibits tenderness. She exhibits no deformity (difficult to assess due to obesity).       Left elbow: She exhibits normal range of motion. Tenderness found.       Left wrist: She exhibits normal range of motion and no tenderness.       Right hip: She exhibits normal range of motion and no tenderness.       Left hip: She exhibits normal range of motion and no tenderness.       Left upper arm: She exhibits tenderness.       Left forearm: She exhibits no tenderness.       Left hand: She exhibits no tenderness.  Normal strength/sensation in L hand  Neurological: She is alert and oriented to person, place, and time.  Skin: Skin  is  warm and dry. She is not diaphoretic.  Nursing note and vitals reviewed.    ED Treatments / Results  Labs (all labs ordered are listed, but only abnormal results are displayed) Labs Reviewed  CBC WITH DIFFERENTIAL/PLATELET - Abnormal; Notable for the following components:      Result Value   WBC 11.5 (*)    Neutro Abs 8.8 (*)    All other components within normal limits  BASIC METABOLIC PANEL    EKG None  Radiology Dg Ribs Unilateral W/chest Left  Result Date: 03/05/2018 CLINICAL DATA:  Fall EXAM: LEFT RIBS AND CHEST - 3+ VIEW COMPARISON:  10/14/2017 FINDINGS: Single-view chest demonstrates cardiomegaly. Dense mitral calcification. No pleural effusion or pneumothorax. Left proximal humerus fracture. Left rib series demonstrates acute left seventh through 10 rib fractures. IMPRESSION: 1. Negative for pneumothorax or pleural effusion.  Cardiomegaly 2. Acute left seventh through tenth rib fractures 3. Acute left proximal humerus fracture Electronically Signed   By: Donavan Foil M.D.   On: 03/05/2018 20:07   Dg Elbow Complete Left  Result Date: 03/05/2018 CLINICAL DATA:  Fall EXAM: LEFT ELBOW - COMPLETE 3+ VIEW COMPARISON:  None. FINDINGS: There is no evidence of fracture, dislocation, or joint effusion. There is no evidence of arthropathy or other focal bone abnormality. Soft tissues are unremarkable. IMPRESSION: Negative. Electronically Signed   By: Donavan Foil M.D.   On: 03/05/2018 20:08   Dg Shoulder Left  Result Date: 03/05/2018 CLINICAL DATA:  Fall EXAM: LEFT SHOULDER - 2+ VIEW COMPARISON:  None. FINDINGS: AC joint is intact. Acute mildly impacted fracture at the left humeral neck. No dislocation of the humeral head. IMPRESSION: Acute slightly impacted and displaced left humeral neck fracture Electronically Signed   By: Donavan Foil M.D.   On: 03/05/2018 20:10   Dg Humerus Left  Result Date: 03/05/2018 CLINICAL DATA:  Fall EXAM: LEFT HUMERUS - 2+ VIEW COMPARISON:  None.  FINDINGS: AC joint is intact. Acute mildly comminuted left humeral neck fracture with mild posterior angulation of distal fracture fragment and about 1/3 bone with of displacement toward the midline. IMPRESSION: Acute comminuted impacted and slightly displaced left proximal humerus fracture Electronically Signed   By: Donavan Foil M.D.   On: 03/05/2018 20:09    Procedures Procedures (including critical care time)  Medications Ordered in ED Medications  diphenhydrAMINE (BENADRYL) capsule 50 mg (has no administration in time range)    Or  diphenhydrAMINE (BENADRYL) injection 50 mg (has no administration in time range)  HYDROmorphone (DILAUDID) injection 1 mg (1 mg Intravenous Given 03/05/18 1855)  HYDROmorphone (DILAUDID) injection 1 mg (1 mg Intravenous Given 03/05/18 2016)  HYDROmorphone (DILAUDID) injection 1 mg (1 mg Intravenous Given 03/05/18 2117)  hydrocortisone sodium succinate (SOLU-CORTEF) injection 200 mg (200 mg Intravenous Given 03/05/18 2138)     Initial Impression / Assessment and Plan / ED Course  I have reviewed the triage vital signs and the nursing notes.  Pertinent labs & imaging results that were available during my care of the patient were reviewed by me and considered in my medical decision making (see chart for details).     Patient's pain is poorly controlled. Given the 4 rib fractures and her age will admit. Currently awaiting screening labs. D/w Trauma, Dr. Ninfa Linden, who advised medical admit and he will consult. He advises CT chest given she's on Eliquis. No CT head or abd due to no symptoms or injuries there. Dr. Stann Mainland of ortho will also consult, currently sling. Labs  pending prior to hospitalist admit. [facility].  Final Clinical Impressions(s) / ED Diagnoses   Final diagnoses:  Closed fracture of proximal end of left humerus, unspecified fracture morphology, initial encounter  Closed fracture of multiple ribs of left side, initial encounter      ED Discharge Orders    None       [name] Gambler, MD 03/05/18 2217    [name] Gambler, MD 03/05/18 2218

## 2018-03-05 NOTE — Consult Note (Signed)
Reason for Consult:fall, rib fractures  Referring Physician: Dr. [name] Gambler  Amber Stephenson is an 72 y.o. female.  HPI: This is a 72 year old female with multiple comorbidities including A. fib on anticoagulation, diabetes, CHF, and renal insufficiency who tripped on a chair at home falling and landing on her left side.  She was found to have 4 nondisplaced left rib fractures and a left humerus fracture.  She currently reports minimal pain.  She is now in a sling regarding the arm.  She denies shortness of breath and reports minimal chest pain.  There was no loss of consciousness.  She denies neck pain, headache, or abdominal pain.  Past Medical History:  Diagnosis Date  . Atrial fibrillation ([place])   . Bipolar affective ([place])   . CHF (congestive heart failure) ([place])   . Chronic back pain   . Diabetes mellitus without complication ([place])   . Hyperlipidemia   . Hypertension   . Pain management   . Renal insufficiency     Past Surgical History:  Procedure Laterality Date  . BREAST REDUCTION SURGERY    . EYE SURGERY Right    cateracts  . HAMMER TOE SURGERY    . LEFT HEART CATH AND CORONARY ANGIOGRAPHY N/A 02/03/2018   Procedure: LEFT HEART CATH AND CORONARY ANGIOGRAPHY;  Surgeon: Martinique, Peter M, MD;  Location: [facility];  Service: Cardiovascular;  Laterality: N/A;  . SHOULDER SURGERY Right   . THIGH SURGERY      Family History  Problem Relation Age of Onset  . Diabetes Mother   . Heart disease Mother   . Heart disease Brother   . Heart disease Sister        CABG  . Alcohol abuse Sister   . Mental illness Brother   . Diabetes Brother     Social History:  reports that she has never smoked. She has never used smokeless tobacco. She reports that she does not drink alcohol or use drugs.  Allergies:  Allergies  Allergen Reactions  . Ivp Dye [Iodinated Diagnostic Agents] Anaphylaxis and Swelling    Throat closes    Medications: I have reviewed the patient's  current medications.  Results for orders placed or performed during the hospital encounter of 03/05/18 (from the past 48 hour(s))  CBC with Differential     Status: Abnormal   Collection Time: 03/05/18  9:05 PM  Result Value Ref Range   WBC 11.5 (H) 4.0 - 10.5 K/uL   RBC 4.78 3.87 - 5.11 MIL/uL   Hemoglobin 14.2 12.0 - 15.0 g/dL   HCT 44.0 36.0 - 46.0 %   MCV 92.1 78.0 - 100.0 fL   MCH 29.7 26.0 - 34.0 pg   MCHC 32.3 30.0 - 36.0 g/dL   RDW 13.0 11.5 - 15.5 %   Platelets 185 150 - 400 K/uL   Neutrophils Relative % 77 %   Neutro Abs 8.8 (H) 1.7 - 7.7 K/uL   Lymphocytes Relative 16 %   Lymphs Abs 1.9 0.7 - 4.0 K/uL   Monocytes Relative 6 %   Monocytes Absolute 0.7 0.1 - 1.0 K/uL   Eosinophils Relative 1 %   Eosinophils Absolute 0.1 0.0 - 0.7 K/uL   Basophils Relative 0 %   Basophils Absolute 0.0 0.0 - 0.1 K/uL   Immature Granulocytes 0 %   Abs Immature Granulocytes 0.1 0.0 - 0.1 K/uL    Comment: Performed at [facility], [address], [place], [place] [address]  Dg Ribs Unilateral W/chest Left  Result Date: 03/05/2018 CLINICAL DATA:  Fall EXAM: LEFT RIBS AND CHEST - 3+ VIEW COMPARISON:  10/14/2017 FINDINGS: Single-view chest demonstrates cardiomegaly. Dense mitral calcification. No pleural effusion or pneumothorax. Left proximal humerus fracture. Left rib series demonstrates acute left seventh through 10 rib fractures. IMPRESSION: 1. Negative for pneumothorax or pleural effusion.  Cardiomegaly 2. Acute left seventh through tenth rib fractures 3. Acute left proximal humerus fracture Electronically Signed   By: Donavan Foil M.D.   On: 03/05/2018 20:07   Dg Elbow Complete Left  Result Date: 03/05/2018 CLINICAL DATA:  Fall EXAM: LEFT ELBOW - COMPLETE 3+ VIEW COMPARISON:  None. FINDINGS: There is no evidence of fracture, dislocation, or joint effusion. There is no evidence of arthropathy or other focal bone abnormality. Soft tissues are unremarkable. IMPRESSION: Negative.  Electronically Signed   By: Donavan Foil M.D.   On: 03/05/2018 20:08   Dg Shoulder Left  Result Date: 03/05/2018 CLINICAL DATA:  Fall EXAM: LEFT SHOULDER - 2+ VIEW COMPARISON:  None. FINDINGS: AC joint is intact. Acute mildly impacted fracture at the left humeral neck. No dislocation of the humeral head. IMPRESSION: Acute slightly impacted and displaced left humeral neck fracture Electronically Signed   By: Donavan Foil M.D.   On: 03/05/2018 20:10   Dg Humerus Left  Result Date: 03/05/2018 CLINICAL DATA:  Fall EXAM: LEFT HUMERUS - 2+ VIEW COMPARISON:  None. FINDINGS: AC joint is intact. Acute mildly comminuted left humeral neck fracture with mild posterior angulation of distal fracture fragment and about 1/3 bone with of displacement toward the midline. IMPRESSION: Acute comminuted impacted and slightly displaced left proximal humerus fracture Electronically Signed   By: Donavan Foil M.D.   On: 03/05/2018 20:09    Review of Systems  All other systems reviewed and are negative.  Blood pressure 124/86, pulse 95, temperature 98.4 F (36.9 C), temperature source Oral, resp. rate 16, height 5\' 2"  (1.575 m), weight 94.3 kg, SpO2 100 %. Physical Exam  Constitutional: She is oriented to person, place, and time. She appears well-developed and well-nourished. No distress.  Obese  HENT:  Head: Normocephalic and atraumatic.  Right Ear: External ear normal.  Left Ear: External ear normal.  Nose: Nose normal.  Mouth/Throat: No oropharyngeal exudate.  Eyes: Pupils are equal, round, and reactive to light. Right eye exhibits no discharge. Left eye exhibits no discharge. No scleral icterus.  Neck: Normal range of motion. Neck supple.  Cervical spine is nontender  Cardiovascular: Intact distal pulses.  No murmur heard. Irregular rate and rhythm  Respiratory: Effort normal and breath sounds normal. No respiratory distress. She has no rales.  There is minimal left lateral chest wall tenderness  GI:  Soft. There is no tenderness. There is no guarding.  Musculoskeletal:  Left arm is now in a sling.  It is tender proximally.  She is neurovascularly intact.  There are no other long bone abnormalities  Neurological: She is alert and oriented to person, place, and time. No cranial nerve deficit.  Skin: Skin is warm. No rash noted. No erythema.  Psychiatric: Her behavior is normal. Judgment normal.    Assessment/Plan: Ground-level fall with left 7 through 10 nondisplaced rib fractures and a left humerus fracture with multiple comorbidities  Her rib fractures are fairly minimal and her pain seems to be fairly well-controlled.  There is no pneumothorax seen on x-ray.  Given the fact she is on anticoagulation, I think she does need a CT scan of  her chest just to make sure were not missing a small thorax.  This will also evaluate the upper abdomen including the liver and spleen although I doubt any other injuries. From a rib standpoint, all she needs his pain control and pulmonary toilet.  Orthopedic surgery has been asked to see the patient regarding her humerus fracture.  She should have her anticoagulation held in case this will need surgical intervention.  We will check a portable chest x-ray tomorrow regarding her ribs.  I believe she needs a admission to the hospital service given her multiple comorbidities.  Trauma service will follow  Delita Chiquito A 03/05/2018, 10:13 PM

## 2018-03-05 NOTE — ED Notes (Signed)
Sling applied as well as Fall Risk socks. Teaching provided for incentive spirometry. Patient requested a few minutes before ambulating.

## 2018-03-05 NOTE — ED Triage Notes (Signed)
Pt brought in by EMS from home, c/o left shoulder pain. Pt able to ambulate. Pt states it feels similar to 8 years ago when she broke her right shoulder. Per EMS pt endorses mechanical fall, tripping over furniture in her house. Pt A+Ox4.

## 2018-03-06 ENCOUNTER — Observation Stay (HOSPITAL_COMMUNITY): Payer: Medicare Other

## 2018-03-06 ENCOUNTER — Other Ambulatory Visit: Payer: Self-pay

## 2018-03-06 ENCOUNTER — Emergency Department (HOSPITAL_COMMUNITY): Payer: Medicare Other

## 2018-03-06 DIAGNOSIS — I10 Essential (primary) hypertension: Secondary | ICD-10-CM | POA: Diagnosis not present

## 2018-03-06 DIAGNOSIS — S2242XD Multiple fractures of ribs, left side, subsequent encounter for fracture with routine healing: Secondary | ICD-10-CM | POA: Diagnosis not present

## 2018-03-06 DIAGNOSIS — Z794 Long term (current) use of insulin: Secondary | ICD-10-CM

## 2018-03-06 DIAGNOSIS — S2242XA Multiple fractures of ribs, left side, initial encounter for closed fracture: Secondary | ICD-10-CM | POA: Diagnosis not present

## 2018-03-06 DIAGNOSIS — S42202A Unspecified fracture of upper end of left humerus, initial encounter for closed fracture: Secondary | ICD-10-CM

## 2018-03-06 DIAGNOSIS — W19XXXA Unspecified fall, initial encounter: Secondary | ICD-10-CM | POA: Diagnosis present

## 2018-03-06 DIAGNOSIS — S299XXA Unspecified injury of thorax, initial encounter: Secondary | ICD-10-CM | POA: Diagnosis not present

## 2018-03-06 DIAGNOSIS — S42302A Unspecified fracture of shaft of humerus, left arm, initial encounter for closed fracture: Secondary | ICD-10-CM | POA: Diagnosis not present

## 2018-03-06 DIAGNOSIS — E1169 Type 2 diabetes mellitus with other specified complication: Secondary | ICD-10-CM | POA: Diagnosis not present

## 2018-03-06 LAB — GLUCOSE, RANDOM: Glucose, Bld: 489 mg/dL — ABNORMAL HIGH (ref 70–99)

## 2018-03-06 LAB — GLUCOSE, CAPILLARY
Glucose-Capillary: 312 mg/dL — ABNORMAL HIGH (ref 70–99)
Glucose-Capillary: 416 mg/dL — ABNORMAL HIGH (ref 70–99)
Glucose-Capillary: 386 mg/dL — ABNORMAL HIGH (ref 70–99)
Glucose-Capillary: 269 mg/dL — ABNORMAL HIGH (ref 70–99)

## 2018-03-06 MED ORDER — ACETAMINOPHEN 325 MG PO TABS
650.0000 mg | ORAL_TABLET | Freq: Four times a day (QID) | ORAL | Status: DC | PRN
  Administered 2018-03-06: 650 mg via ORAL
  Filled 2018-03-06: qty 2

## 2018-03-06 MED ORDER — LORAZEPAM 1 MG PO TABS
1.0000 mg | ORAL_TABLET | Freq: Every day | ORAL | Status: DC
  Administered 2018-03-06 – 2018-03-08 (×3): 1 mg via ORAL
  Filled 2018-03-06 (×3): qty 1

## 2018-03-06 MED ORDER — LORAZEPAM 1 MG PO TABS
2.0000 mg | ORAL_TABLET | Freq: Every day | ORAL | Status: DC
  Administered 2018-03-06 – 2018-03-07 (×2): 2 mg via ORAL
  Filled 2018-03-06 (×2): qty 2

## 2018-03-06 MED ORDER — OXYCODONE HCL ER 10 MG PO T12A
10.0000 mg | EXTENDED_RELEASE_TABLET | Freq: Two times a day (BID) | ORAL | Status: DC
  Administered 2018-03-06 – 2018-03-08 (×4): 10 mg via ORAL
  Filled 2018-03-06 (×4): qty 1

## 2018-03-06 MED ORDER — DULAGLUTIDE 1.5 MG/0.5ML ~~LOC~~ SOAJ: 1.5000 mg | SUBCUTANEOUS | Status: DC

## 2018-03-06 MED ORDER — INSULIN ASPART 100 UNIT/ML ~~LOC~~ SOLN
0.0000 [IU] | Freq: Three times a day (TID) | SUBCUTANEOUS | Status: DC
  Administered 2018-03-06: 8 [IU] via SUBCUTANEOUS
  Administered 2018-03-06: 15 [IU] via SUBCUTANEOUS
  Administered 2018-03-07: 5 [IU] via SUBCUTANEOUS
  Administered 2018-03-07 (×2): 8 [IU] via SUBCUTANEOUS
  Administered 2018-03-08: 5 [IU] via SUBCUTANEOUS
  Administered 2018-03-08: 8 [IU] via SUBCUTANEOUS

## 2018-03-06 MED ORDER — ONDANSETRON HCL 4 MG PO TABS: 4.0000 mg | ORAL_TABLET | Freq: Four times a day (QID) | ORAL | Status: DC | PRN

## 2018-03-06 MED ORDER — DILTIAZEM HCL ER COATED BEADS 120 MG PO CP24: 120.0000 mg | ORAL_CAPSULE | Freq: Every day | ORAL | Status: DC

## 2018-03-06 MED ORDER — LORAZEPAM 1 MG PO TABS: 1.0000 mg | ORAL_TABLET | ORAL | Status: DC

## 2018-03-06 MED ORDER — HYDROMORPHONE HCL 1 MG/ML IJ SOLN
0.5000 mg | INTRAMUSCULAR | Status: DC | PRN
  Administered 2018-03-06 – 2018-03-07 (×2): 0.5 mg via INTRAVENOUS
  Filled 2018-03-06 (×2): qty 1

## 2018-03-06 MED ORDER — HYDROCODONE-ACETAMINOPHEN 5-325 MG PO TABS
1.0000 | ORAL_TABLET | ORAL | Status: DC | PRN
  Administered 2018-03-06 – 2018-03-07 (×3): 1 via ORAL
  Filled 2018-03-06 (×3): qty 1

## 2018-03-06 MED ORDER — METOPROLOL TARTRATE 25 MG PO TABS
125.0000 mg | ORAL_TABLET | Freq: Two times a day (BID) | ORAL | Status: DC
  Administered 2018-03-06 – 2018-03-08 (×5): 125 mg via ORAL
  Filled 2018-03-06 (×5): qty 1

## 2018-03-06 MED ORDER — INSULIN GLARGINE 100 UNIT/ML ~~LOC~~ SOLN
40.0000 [IU] | Freq: Two times a day (BID) | SUBCUTANEOUS | Status: DC
  Administered 2018-03-06 – 2018-03-08 (×5): 40 [IU] via SUBCUTANEOUS
  Filled 2018-03-06 (×6): qty 0.4

## 2018-03-06 MED ORDER — DILTIAZEM HCL ER COATED BEADS 120 MG PO CP24
120.0000 mg | ORAL_CAPSULE | Freq: Every day | ORAL | Status: DC
  Administered 2018-03-06 – 2018-03-08 (×3): 120 mg via ORAL
  Filled 2018-03-06 (×3): qty 1

## 2018-03-06 MED ORDER — MOMETASONE FURO-FORMOTEROL FUM 100-5 MCG/ACT IN AERO
2.0000 | INHALATION_SPRAY | Freq: Two times a day (BID) | RESPIRATORY_TRACT | Status: DC
  Administered 2018-03-06 – 2018-03-08 (×4): 2 via RESPIRATORY_TRACT
  Filled 2018-03-06: qty 8.8

## 2018-03-06 MED ORDER — ONDANSETRON HCL 4 MG/2ML IJ SOLN: 4.0000 mg | Freq: Four times a day (QID) | INTRAMUSCULAR | Status: DC | PRN

## 2018-03-06 MED ORDER — METOPROLOL TARTRATE 25 MG PO TABS: 100.0000 mg | ORAL_TABLET | Freq: Two times a day (BID) | ORAL | Status: DC

## 2018-03-06 MED ORDER — FUROSEMIDE 20 MG PO TABS
20.0000 mg | ORAL_TABLET | Freq: Two times a day (BID) | ORAL | Status: DC
  Administered 2018-03-06 – 2018-03-07 (×4): 20 mg via ORAL
  Filled 2018-03-06 (×5): qty 1

## 2018-03-06 MED ORDER — TRAZODONE HCL 100 MG PO TABS
300.0000 mg | ORAL_TABLET | Freq: Every day | ORAL | Status: DC
  Administered 2018-03-06 – 2018-03-07 (×2): 300 mg via ORAL
  Filled 2018-03-06 (×2): qty 3

## 2018-03-06 MED ORDER — HYDROMORPHONE HCL 1 MG/ML IJ SOLN
1.0000 mg | Freq: Once | INTRAMUSCULAR | Status: AC
  Administered 2018-03-06: 1 mg via INTRAVENOUS
  Filled 2018-03-06: qty 1

## 2018-03-06 MED ORDER — APIXABAN 5 MG PO TABS: 5.0000 mg | ORAL_TABLET | Freq: Two times a day (BID) | ORAL | Status: DC

## 2018-03-06 MED ORDER — ATORVASTATIN CALCIUM 40 MG PO TABS
40.0000 mg | ORAL_TABLET | Freq: Every day | ORAL | Status: DC
  Administered 2018-03-06 – 2018-03-07 (×2): 40 mg via ORAL
  Filled 2018-03-06 (×2): qty 1

## 2018-03-06 MED ORDER — ARIPIPRAZOLE 10 MG PO TABS
30.0000 mg | ORAL_TABLET | Freq: Every day | ORAL | Status: DC
  Administered 2018-03-06 – 2018-03-08 (×3): 30 mg via ORAL
  Filled 2018-03-06: qty 6
  Filled 2018-03-06: qty 3
  Filled 2018-03-06: qty 6
  Filled 2018-03-06 (×2): qty 3
  Filled 2018-03-06: qty 6

## 2018-03-06 MED ORDER — HYDROMORPHONE HCL 1 MG/ML IJ SOLN
1.0000 mg | INTRAMUSCULAR | Status: DC | PRN
  Administered 2018-03-06 (×3): 1 mg via INTRAVENOUS
  Filled 2018-03-06 (×3): qty 1

## 2018-03-06 MED ORDER — PREGABALIN 75 MG PO CAPS
150.0000 mg | ORAL_CAPSULE | Freq: Two times a day (BID) | ORAL | Status: DC
  Administered 2018-03-06 – 2018-03-08 (×5): 150 mg via ORAL
  Filled 2018-03-06 (×5): qty 2

## 2018-03-06 MED ORDER — METOPROLOL TARTRATE 25 MG PO TABS: 25.0000 mg | ORAL_TABLET | Freq: Two times a day (BID) | ORAL | Status: DC

## 2018-03-06 MED ORDER — FENOFIBRATE 160 MG PO TABS
160.0000 mg | ORAL_TABLET | Freq: Every day | ORAL | Status: DC
  Administered 2018-03-06 – 2018-03-08 (×3): 160 mg via ORAL
  Filled 2018-03-06 (×3): qty 1

## 2018-03-06 MED ORDER — ACETAMINOPHEN 650 MG RE SUPP: 650.0000 mg | Freq: Four times a day (QID) | RECTAL | Status: DC | PRN

## 2018-03-06 MED ORDER — APIXABAN 5 MG PO TABS
5.0000 mg | ORAL_TABLET | Freq: Two times a day (BID) | ORAL | Status: DC
  Administered 2018-03-06 – 2018-03-08 (×5): 5 mg via ORAL
  Filled 2018-03-06 (×5): qty 1

## 2018-03-06 MED ORDER — IOHEXOL 300 MG/ML  SOLN
75.0000 mL | Freq: Once | INTRAMUSCULAR | Status: AC | PRN
  Administered 2018-03-06: 75 mL via INTRAVENOUS

## 2018-03-06 NOTE — Plan of Care (Signed)
  Problem: Education: Goal: Knowledge of General Education information will improve Description Including pain rating scale, medication(s)/side effects and non-pharmacologic comfort measures Outcome: Progressing   

## 2018-03-06 NOTE — Progress Notes (Signed)
Pharmacy notified via message for the need of missing Lantus dose.

## 2018-03-06 NOTE — ED Notes (Signed)
Patient was given ginger ale and her bag of [employer] at the bedside.

## 2018-03-06 NOTE — Progress Notes (Signed)
Pharmacy was called direct for missing Lantus dose for 1000.  Will send.

## 2018-03-06 NOTE — Progress Notes (Signed)
Dr Victorino December was called in regards to the Blood glucose of 416.  Awaiting return call.

## 2018-03-06 NOTE — Progress Notes (Signed)
The patient reports that she spoke with Dr Rosendo Gros regarding the concern of high blood glucose readings.  A stat glucose was drawn earlier with a result of 489.  No new orders received.

## 2018-03-06 NOTE — Progress Notes (Signed)
Subjective/Chief Complaint: Pt doing well this AM LUE arm pain Working on IS   Objective: Vital signs in last 24 hours: Temp:  [98.4 F (36.9 C)-98.8 F (37.1 C)] 98.8 F (37.1 C) (10/05 0300) Pulse Rate:  [69-105] 88 (10/05 0933) Resp:  [16-22] 19 (10/05 0300) BP: (99-145)/(70-95) 128/84 (10/05 0933) SpO2:  [94 %-100 %] 97 % (10/05 0300) Weight:  [93.7 kg-94.3 kg] 93.7 kg (10/05 0300) Last BM Date: 03/05/18  Intake/Output from previous day: 10/04 0701 - 10/05 0700 In: 240 [P.O.:240] Out: -  Intake/Output this shift: No intake/output data recorded.  Constitutional: No acute distress, conversant, appears states age. Eyes: Anicteric sclerae, moist conjunctiva, no lid lag Lungs: Clear to auscultation bilaterally, normal respiratory effort CV: regular rate and rhythm, no murmurs, no peripheral edema, pedal pulses 2+ GI: Soft, no masses or hepatosplenomegaly, non-tender to palpation Skin: No rashes, palpation reveals normal turgor Ext: LUE in sling Psychiatric: appropriate judgment and insight, oriented to person, place, and time  Lab Results:  Recent Labs    03/05/18 2105  WBC 11.5*  HGB 14.2  HCT 44.0  PLT 185   BMET Recent Labs    03/05/18 2105  NA 141  K 4.0  CL 105  CO2 28  GLUCOSE 203*  BUN 14  CREATININE 0.95  CALCIUM 8.9   PT/INR No results for input(s): LABPROT, INR in the last 72 hours. ABG No results for input(s): PHART, HCO3 in the last 72 hours.  Invalid input(s): PCO2, PO2  Studies/Results: Dg Ribs Unilateral W/chest Left  Result Date: 03/05/2018 CLINICAL DATA:  Fall EXAM: LEFT RIBS AND CHEST - 3+ VIEW COMPARISON:  10/14/2017 FINDINGS: Single-view chest demonstrates cardiomegaly. Dense mitral calcification. No pleural effusion or pneumothorax. Left proximal humerus fracture. Left rib series demonstrates acute left seventh through 10 rib fractures. IMPRESSION: 1. Negative for pneumothorax or pleural effusion.  Cardiomegaly 2. Acute left  seventh through tenth rib fractures 3. Acute left proximal humerus fracture Electronically Signed   By: Donavan Foil M.D.   On: 03/05/2018 20:07   Dg Elbow Complete Left  Result Date: 03/05/2018 CLINICAL DATA:  Fall EXAM: LEFT ELBOW - COMPLETE 3+ VIEW COMPARISON:  None. FINDINGS: There is no evidence of fracture, dislocation, or joint effusion. There is no evidence of arthropathy or other focal bone abnormality. Soft tissues are unremarkable. IMPRESSION: Negative. Electronically Signed   By: Donavan Foil M.D.   On: 03/05/2018 20:08   Ct Chest W Contrast  Result Date: 03/06/2018 CLINICAL DATA:  72 y/o F; mechanical fall with left shoulder injury. Chest trauma, blunt, aortic injury suspected. EXAM: CT CHEST WITH CONTRAST TECHNIQUE: Multidetector CT imaging of the chest was performed during intravenous contrast administration. CONTRAST:  82mL OMNIPAQUE IOHEXOL 300 MG/ML  SOLN COMPARISON:  12/24/2017 and 10/16/2017 CT chest. FINDINGS: Cardiovascular: No findings of acute aortic injury, dissection, aneurysm. Mild aortic calcific atherosclerosis. Mild coronary artery calcific atherosclerosis. Mitral annular calcification. Mild cardiomegaly. No pericardial effusion. Enlarged main pulmonary artery may represent pulmonary artery hypertension. Mediastinum/Nodes: No enlarged mediastinal, hilar, or axillary lymph nodes. Thyroid gland, trachea, and esophagus demonstrate no significant findings. Lungs/Pleura: Several scattered 3-4 mm calcified pulmonary nodules compatible granulomatous disease. 3 mm pulmonary nodule in the right upper lobe (series 4, image 66). No consolidation, effusion, or pneumothorax. Upper Abdomen: Stable 9 mm nodule within the left adrenal gland compatible with adenoma. Musculoskeletal: Acute left proximal humerus minimally displaced fracture. No joint dislocation. Left 7 through 10 rib fractures demonstrate callus formation and likely chronic. No  acute rib fracture identified. Chronic fracture  deformity of the right proximal humerus. Stable mild chronic loss of height of the T11 and T12 vertebral bodies. IMPRESSION: 1. Acute left proximal humerus minimally displaced fracture. No joint dislocation. 2. Left 7 through 10 rib fractures demonstrate callus formation and are likely chronic. No additional fracture identified. 3. No acute pulmonary process. 4. Enlarged main pulmonary artery may represent pulmonary artery hypertension. 5. Mild aortic and coronary artery calcific atherosclerosis. 6. Stable left adrenal adenoma. 7. Stable chronic fracture deformity of right proximal humerus, partially visualized. Stable mild loss of height of T11 and T12 vertebral bodies. Electronically Signed   By: Kristine Garbe M.D.   On: 03/06/2018 02:33   Dg Chest Port 1 View  Result Date: 03/06/2018 CLINICAL DATA:  Chest injury following a fall. Probable chronic left 7th through 10th rib fractures on a recent chest CT. There is also a chronic fracture deformity of the proximal right humerus as well as an acute proximal left humerus fracture. EXAM: PORTABLE CHEST 1 VIEW COMPARISON:  Chest CT dated 03/06/2018. Chest and left rib radiographs dated 03/05/2018. FINDINGS: Stable enlarged heart and mildly prominent interstitial markings. Mild increase in prominence of the pulmonary vasculature. Previously demonstrated acute left humeral neck fracture and old, healed right humeral neck and head fracture. No acute rib fractures visible today. IMPRESSION: 1. No acute abnormality. 2. Stable cardiomegaly and mild chronic interstitial lung disease with interval mild pulmonary vascular congestion. 3. Previously demonstrated acute left humeral neck fracture and old, healed right humeral neck and head fracture. Electronically Signed   By: Claudie Revering M.D.   On: 03/06/2018 07:53   Dg Shoulder Left  Result Date: 03/05/2018 CLINICAL DATA:  Fall EXAM: LEFT SHOULDER - 2+ VIEW COMPARISON:  None. FINDINGS: AC joint is intact. Acute  mildly impacted fracture at the left humeral neck. No dislocation of the humeral head. IMPRESSION: Acute slightly impacted and displaced left humeral neck fracture Electronically Signed   By: Donavan Foil M.D.   On: 03/05/2018 20:10   Dg Humerus Left  Result Date: 03/05/2018 CLINICAL DATA:  Fall EXAM: LEFT HUMERUS - 2+ VIEW COMPARISON:  None. FINDINGS: AC joint is intact. Acute mildly comminuted left humeral neck fracture with mild posterior angulation of distal fracture fragment and about 1/3 bone with of displacement toward the midline. IMPRESSION: Acute comminuted impacted and slightly displaced left proximal humerus fracture Electronically Signed   By: Donavan Foil M.D.   On: 03/05/2018 20:09    Anti-infectives: Anti-infectives (From admission, onward)   None      Assessment/Plan: 27F s/p Fall L 7-10 rib fx L humeral fx DM CHF HTN  1.  Con't pain control 2. Pulm toilet 3.  PT to see 4. OK for home when pain well controlled and mobilizing safely   LOS: 0 days    Ralene Ok 03/06/2018

## 2018-03-06 NOTE — H&P (Signed)
History and Physical    SMT LOKEY PXT:[id number] DOB: December 15, 1945 DOA: 03/05/2018  PCP: [name], DO  Patient coming from: Home  I have personally briefly reviewed patient's old medical records in [place]  Chief Complaint: Fall  HPI: Amber Stephenson is a 72 y.o. female with medical history significant of A.Fib on eliquis, DM, chronic back pain.  Patient presents to the ED after a mechanical fall at home.  Tripped on chair, landed on L side.  Pain in L chest and L arm after fall.  Severe initially.  No LOC.  No head injury.   ED Course: L humorous fx and 4 L rib fx on X ray.  Ct scan looking for PTx / HTx however, not only doesn't demonstrate PTx / HTx or complication of the rib fx, it suggests that rib fxs actually are old / chronic with callus formation.   Review of Systems: As per HPI otherwise 10 point review of systems negative.   Past Medical History:  Diagnosis Date  . Atrial fibrillation ([place])   . Bipolar affective ([place])   . CHF (congestive heart failure) ([place])   . Chronic back pain   . Diabetes mellitus without complication ([place])   . Hyperlipidemia   . Hypertension   . Pain management   . Renal insufficiency     Past Surgical History:  Procedure Laterality Date  . BREAST REDUCTION SURGERY    . EYE SURGERY Right    cateracts  . HAMMER TOE SURGERY    . LEFT HEART CATH AND CORONARY ANGIOGRAPHY N/A 02/03/2018   Procedure: LEFT HEART CATH AND CORONARY ANGIOGRAPHY;  Surgeon: Martinique, Peter M, MD;  Location: [facility];  Service: Cardiovascular;  Laterality: N/A;  . SHOULDER SURGERY Right   . THIGH SURGERY       reports that she has never smoked. She has never used smokeless tobacco. She reports that she does not drink alcohol or use drugs.  Allergies  Allergen Reactions  . Ivp Dye [Iodinated Diagnostic Agents] Anaphylaxis and Swelling    Throat closes    Family History  Problem Relation Age of Onset  . Diabetes Mother   .  Heart disease Mother   . Heart disease Brother   . Heart disease Sister        CABG  . Alcohol abuse Sister   . Mental illness Brother   . Diabetes Brother      Prior to Admission medications   Medication Sig Start Date End Date Taking? Authorizing Provider  apixaban (ELIQUIS) 5 MG TABS tablet Take 1 tablet (5 mg total) by mouth 2 (two) times daily. 12/10/17  Yes Timmothy Euler, MD  ARIPiprazole (ABILIFY) 10 MG tablet TAKE 3 TABLETS EVERY MORNING Patient taking differently: Take 30 mg by mouth daily.  07/15/17  Yes Plovsky, Berneta Sages, MD  atorvastatin (LIPITOR) 40 MG tablet TAKE 1 TABLET DAILY Patient taking differently: Take 40 mg by mouth daily at 6 PM.  01/05/18  Yes Gottschalk, Ashly M, DO  budesonide-formoterol (SYMBICORT) 80-4.5 MCG/ACT inhaler Inhale 2 puffs into the lungs 2 (two) times daily. 03/04/18  Yes Ronnie Doss M, DO  Cholecalciferol (VITAMIN D3) 1000 UNITS CAPS Take 1,000 Units by mouth daily.   Yes [provider]  diltiazem (CARDIZEM CD) 120 MG 24 hr capsule Take 1 capsule (120 mg total) by mouth at bedtime. 10/29/17  Yes Herminio Commons, MD  Dulaglutide (TRULICITY) 1.5 OE/7.0JJ SOPN Inject 1.5 mg into the skin  once a week. 08/20/17  Yes Timmothy Euler, MD  furosemide (LASIX) 20 MG tablet Take 1 tablet (20 mg total) by mouth 2 (two) times daily. 03/03/18  Yes Herminio Commons, MD  Insulin Glargine (LANTUS SOLOSTAR) 100 UNIT/ML Solostar Pen INJECT 60 UNITS UNDER THE SKIN TWICE A DAY Patient taking differently: Inject 60 Units into the skin 2 (two) times daily.  01/25/18  Yes Herminio Commons, MD  LORazepam (ATIVAN) 1 MG tablet Take 1 tab (1 mg) in the morning and 2 tabs (2 mg ) at bedtime Patient taking differently: Take 1-2 mg by mouth See admin instructions. Take 1 tab (1 mg) in the morning and 2 tabs (2 mg ) at bedtime 02/10/18  Yes Plovsky, Berneta Sages, MD  metoprolol tartrate (LOPRESSOR) 100 MG tablet TAKE 1 TABLET TWICE A DAY Patient taking  differently: Take 100 mg by mouth 2 (two) times daily. Take with metoprolol 25 mg to equal 125 mg 05/01/17  Yes Herminio Commons, MD  metoprolol tartrate (LOPRESSOR) 25 MG tablet TAKE 1 TABLET TWICE A DAY (TO BE TAKEN ALONG WITH THE LOPRESSOR 100 MG TWICE A DAY) Patient taking differently: Take 25 mg by mouth 2 (two) times daily. Take with metoprolol 100 mg to equal 125 mg 03/04/18  Yes Herminio Commons, MD  naloxone [facility]) nasal spray 4 mg/0.1 mL Place 1 spray into the nose once as needed (opiod overdose).   Yes [provider]  oxyCODONE-acetaminophen (PERCOCET/ROXICET) 5-325 MG tablet Take 1 tablet by mouth every 8 (eight) hours. 08/07/17  Yes [provider]  pregabalin (LYRICA) 150 MG capsule Take 1 capsule (150 mg total) by mouth 2 (two) times daily. 10/09/17 10/09/18 Yes Plovsky, Berneta Sages, MD  rizatriptan (MAXALT-MLT) 10 MG disintegrating tablet DISSOLVE 1 TABLET UNDER THE TONGUE IMMEDIATELY AT SIGN OF HEADACHE. MAY REPEAT DOSE IN 1 HOUR IF NEEDED. MAX OF 3 TABLETS IN 24 HOURS Patient taking differently: Take 10 mg by mouth as needed for migraine.  01/13/18  Yes Martin, Mary-Margaret, FNP  SUMAtriptan (IMITREX) 100 MG tablet TAKE 1 TABLET BY MOUTH AT ONSET OF HEADACHE. MAY REPEAT IN 2 HOURS IF HEADACHE PERSISTS OR RECURS.LIMIT 2 PER 24 HOURS Patient taking differently: Take 100 mg by mouth every 2 (two) hours as needed for migraine or headache.  02/09/18  Yes Gottschalk, Ashly M, DO  traZODone (DESYREL) 100 MG tablet Take 3 tablets (300 mg total) by mouth at bedtime. 01/13/18  Yes Plovsky, Berneta Sages, MD  TRICOR 145 MG tablet TAKE 1 TABLET DAILY Patient taking differently: Take 145 mg by mouth daily.  12/10/17  Yes Timmothy Euler, MD  Blood Glucose Monitoring Suppl (FREESTYLE LITE) DEVI Use to check blood sugar tid. DX E11.22 04/04/15   Timmothy Euler, MD  FREESTYLE LITE test strip USE FOR TESTING SUGAR THREE TIMES DAILY 01/19/17   Timmothy Euler, MD  Insulin Pen Needle  (SURE COMFORT PEN NEEDLES) 32G X 4 MM MISC Use with insulin pens as directed. Dx E11.9 08/16/15   Timmothy Euler, MD  Lancets (FREESTYLE) lancets Test tid. DX E11.22 08/12/17   Timmothy Euler, MD    Physical Exam: Vitals:   03/06/18 0000 03/06/18 0100 03/06/18 0120 03/06/18 0200  BP: 99/76 133/90 133/90 134/86  Pulse: 83 92 (!) 105 97  Resp:   (!) 22   Temp:      TempSrc:      SpO2: 96% 98% 99% 98%  Weight:      Height:  Constitutional: NAD, calm, comfortable Eyes: PERRL, lids and conjunctivae normal ENMT: Mucous membranes are moist. Posterior pharynx clear of any exudate or lesions.Normal dentition.  Neck: normal, supple, no masses, no thyromegaly Respiratory: clear to auscultation bilaterally, no wheezing, no crackles. Normal respiratory effort. No accessory muscle use.  Cardiovascular: Regular rate and rhythm, no murmurs / rubs / gallops. No extremity edema. 2+ pedal pulses. No carotid bruits.  Abdomen: no tenderness, no masses palpated. No hepatosplenomegaly. Bowel sounds positive.  Musculoskeletal: L arm in sling Skin: no rashes, lesions, ulcers. No induration Neurologic: CN 2-12 grossly intact. Sensation intact, DTR normal. Strength 5/5 in all 4.  Psychiatric: Normal judgment and insight. Alert and oriented x 3. Normal mood.    Labs on Admission: I have personally reviewed following labs and imaging studies  CBC: Recent Labs  Lab 03/05/18 2105  WBC 11.5*  NEUTROABS 8.8*  HGB 14.2  HCT 44.0  MCV 92.1  PLT 633   Basic Metabolic Panel: Recent Labs  Lab 03/05/18 2105  NA 141  K 4.0  CL 105  CO2 28  GLUCOSE 203*  BUN 14  CREATININE 0.95  CALCIUM 8.9   GFR: Estimated Creatinine Clearance: 59 mL/min (by C-G formula based on SCr of 0.95 mg/dL). Liver Function Tests: No results for input(s): AST, ALT, ALKPHOS, BILITOT, PROT, ALBUMIN in the last 168 hours. No results for input(s): LIPASE, AMYLASE in the last 168 hours. No results for input(s):  AMMONIA in the last 168 hours. Coagulation Profile: No results for input(s): INR, PROTIME in the last 168 hours. Cardiac Enzymes: No results for input(s): CKTOTAL, CKMB, CKMBINDEX, TROPONINI in the last 168 hours. BNP (last 3 results) No results for input(s): PROBNP in the last 8760 hours. HbA1C: No results for input(s): HGBA1C in the last 72 hours. CBG: No results for input(s): GLUCAP in the last 168 hours. Lipid Profile: No results for input(s): CHOL, HDL, LDLCALC, TRIG, CHOLHDL, LDLDIRECT in the last 72 hours. Thyroid Function Tests: No results for input(s): TSH, T4TOTAL, FREET4, T3FREE, THYROIDAB in the last 72 hours. Anemia Panel: No results for input(s): VITAMINB12, FOLATE, FERRITIN, TIBC, IRON, RETICCTPCT in the last 72 hours. Urine analysis:    Component Value Date/Time   COLORURINE YELLOW 10/16/2017 1720   APPEARANCEUR CLEAR 10/16/2017 1720   APPEARANCEUR Clear 10/01/2017 1424   LABSPEC 1.008 10/16/2017 1720   PHURINE 6.0 10/16/2017 1720   GLUCOSEU NEGATIVE 10/16/2017 1720   HGBUR NEGATIVE 10/16/2017 1720   BILIRUBINUR NEGATIVE 10/16/2017 1720   BILIRUBINUR Negative 10/01/2017 1424   KETONESUR NEGATIVE 10/16/2017 1720   PROTEINUR NEGATIVE 10/16/2017 1720   UROBILINOGEN 0.2 07/23/2015 1350   NITRITE NEGATIVE 10/16/2017 1720   LEUKOCYTESUR NEGATIVE 10/16/2017 1720   LEUKOCYTESUR Trace (A) 10/01/2017 1424    Radiological Exams on Admission: Dg Ribs Unilateral W/chest Left  Result Date: 03/05/2018 CLINICAL DATA:  Fall EXAM: LEFT RIBS AND CHEST - 3+ VIEW COMPARISON:  10/14/2017 FINDINGS: Single-view chest demonstrates cardiomegaly. Dense mitral calcification. No pleural effusion or pneumothorax. Left proximal humerus fracture. Left rib series demonstrates acute left seventh through 10 rib fractures. IMPRESSION: 1. Negative for pneumothorax or pleural effusion.  Cardiomegaly 2. Acute left seventh through tenth rib fractures 3. Acute left proximal humerus fracture  Electronically Signed   By: Donavan Foil M.D.   On: 03/05/2018 20:07   Dg Elbow Complete Left  Result Date: 03/05/2018 CLINICAL DATA:  Fall EXAM: LEFT ELBOW - COMPLETE 3+ VIEW COMPARISON:  None. FINDINGS: There is no evidence of fracture, dislocation, or joint  effusion. There is no evidence of arthropathy or other focal bone abnormality. Soft tissues are unremarkable. IMPRESSION: Negative. Electronically Signed   By: Donavan Foil M.D.   On: 03/05/2018 20:08   Dg Shoulder Left  Result Date: 03/05/2018 CLINICAL DATA:  Fall EXAM: LEFT SHOULDER - 2+ VIEW COMPARISON:  None. FINDINGS: AC joint is intact. Acute mildly impacted fracture at the left humeral neck. No dislocation of the humeral head. IMPRESSION: Acute slightly impacted and displaced left humeral neck fracture Electronically Signed   By: Donavan Foil M.D.   On: 03/05/2018 20:10   Dg Humerus Left  Result Date: 03/05/2018 CLINICAL DATA:  Fall EXAM: LEFT HUMERUS - 2+ VIEW COMPARISON:  None. FINDINGS: AC joint is intact. Acute mildly comminuted left humeral neck fracture with mild posterior angulation of distal fracture fragment and about 1/3 bone with of displacement toward the midline. IMPRESSION: Acute comminuted impacted and slightly displaced left proximal humerus fracture Electronically Signed   By: Donavan Foil M.D.   On: 03/05/2018 20:09    EKG: Independently reviewed.  Assessment/Plan Principal Problem:   Left humeral fracture Active Problems:   DM2 (diabetes mellitus, type 2) (HCC)   Permanent atrial fibrillation   Essential hypertension   Fracture of four ribs of left side, closed, initial encounter    1. Fall - with 4 L rib fx and L humeral fx 1. Pain control with dilaudid PRN 2. Fx of 4 ribs of L side - 1. CT chest results just came in: actually these fx look chronic, not acute! 2. Presumably okay to resume eliquis then. 3. L humeral fx - 1. Patient in sling 2. Non-op management per Ortho 4. A.Fib - 1. Continue  rate control meds 2. Holding Eliquis until CT comes back clear, check with trauma in AM timing of resuming eliquis in this case. 5. DM2 - 1. Lantus 40 BID 2. Mod scale SSI AC 3. Cont Duraglutide on Sun  DVT prophylaxis: Eliquis Code Status: Full Family Communication: No family in room Disposition Plan: Home after admit Consults called: Trauma surgery and Ortho Admission status: Place in [place], [place] [facility] Pager [phone] Only works nights!  If 7AM-7PM, please contact the primary day team physician taking care of patient  [email or web address] Password [email or web address]  03/06/2018, 2:31 AM

## 2018-03-06 NOTE — Progress Notes (Signed)
Pt's pain is at a 9/10. Will given PRN IV diluadid when due.  Pt reports that oral pain agents are not working. Will continue to monitor.

## 2018-03-06 NOTE — Progress Notes (Signed)
Patient was admitted earlier today.  Amber Stephenson is a 72 y.o. female with medical history significant of A.Fib on eliquis, DM, chronic back pain.  Patient was admitted with mechanical fall, left humerus fracture, and left-sided rib cage pain.  Goal is to control patient's pain.  Was start patient on OxyContin, as well as as needed oral pain medication.  Will gradually wean of IV Dilaudid.

## 2018-03-06 NOTE — ED Notes (Signed)
Patient transported to CT 

## 2018-03-07 ENCOUNTER — Encounter (HOSPITAL_COMMUNITY): Payer: Self-pay | Admitting: Emergency Medicine

## 2018-03-07 DIAGNOSIS — S42292A Other displaced fracture of upper end of left humerus, initial encounter for closed fracture: Secondary | ICD-10-CM

## 2018-03-07 DIAGNOSIS — S42302A Unspecified fracture of shaft of humerus, left arm, initial encounter for closed fracture: Secondary | ICD-10-CM | POA: Diagnosis not present

## 2018-03-07 DIAGNOSIS — E1169 Type 2 diabetes mellitus with other specified complication: Secondary | ICD-10-CM | POA: Diagnosis not present

## 2018-03-07 DIAGNOSIS — S2242XA Multiple fractures of ribs, left side, initial encounter for closed fracture: Secondary | ICD-10-CM

## 2018-03-07 DIAGNOSIS — Z794 Long term (current) use of insulin: Secondary | ICD-10-CM | POA: Diagnosis not present

## 2018-03-07 LAB — HEMOGLOBIN A1C
Hgb A1c MFr Bld: 10.4 % — ABNORMAL HIGH (ref 4.8–5.6)
Mean Plasma Glucose: 251.78 mg/dL

## 2018-03-07 LAB — HIV ANTIBODY (ROUTINE TESTING W REFLEX): HIV Screen 4th Generation wRfx: NONREACTIVE

## 2018-03-07 LAB — GLUCOSE, CAPILLARY
Glucose-Capillary: 216 mg/dL — ABNORMAL HIGH (ref 70–99)
Glucose-Capillary: 292 mg/dL — ABNORMAL HIGH (ref 70–99)
Glucose-Capillary: 261 mg/dL — ABNORMAL HIGH (ref 70–99)
Glucose-Capillary: 216 mg/dL — ABNORMAL HIGH (ref 70–99)
Glucose-Capillary: 284 mg/dL — ABNORMAL HIGH (ref 70–99)

## 2018-03-07 MED ORDER — ACETAMINOPHEN 500 MG PO TABS
1000.0000 mg | ORAL_TABLET | Freq: Three times a day (TID) | ORAL | Status: DC
  Administered 2018-03-07 – 2018-03-08 (×3): 1000 mg via ORAL
  Filled 2018-03-07 (×3): qty 2

## 2018-03-07 MED ORDER — METHOCARBAMOL 500 MG PO TABS
500.0000 mg | ORAL_TABLET | Freq: Three times a day (TID) | ORAL | Status: DC | PRN
  Administered 2018-03-07 – 2018-03-08 (×3): 500 mg via ORAL
  Filled 2018-03-07 (×3): qty 1

## 2018-03-07 MED ORDER — OXYCODONE HCL 5 MG PO TABS
5.0000 mg | ORAL_TABLET | ORAL | Status: DC | PRN
  Administered 2018-03-07 – 2018-03-08 (×4): 10 mg via ORAL
  Filled 2018-03-07 (×4): qty 2

## 2018-03-07 NOTE — Progress Notes (Signed)
PROGRESS NOTE    Amber Stephenson  XAJ:[id number] DOB: 1945-09-20 DOA: 03/05/2018 PCP: [name], DO  Outpatient Specialists:   Brief Narrative:  Amber Stephenson a 72 y.o.femalewith medical history significant ofA.Fib on eliquis, DM, chronic back pain.  Patient was admitted with mechanical fall, left humerus fracture, and left-sided rib cage pain.  Goal is to control patient's pain.  Was start patient on OxyContin, as well as as needed oral pain medication.  Will gradually wean of IV Dilaudid   Assessment & Plan:   Principal Problem:   Left humeral fracture Active Problems:   DM2 (diabetes mellitus, type 2) (HCC)   Permanent atrial fibrillation   Essential hypertension   Fracture of four ribs of left side, closed, initial encounter   Fall - with 4 L rib fx and L humeral fx: Goal is for adequate pain control. Continue OxyContin 10 mg p.o. every 12 hourly. Continue oral opiates PRN for pain control. Gradually wean patient off IV opiates. Consult PT and OT. Pain is not optimized. Likely discharge in the morning.  Fx of 4 ribs of L side: CT chest revealed that the rib fractures were old.   Patient is still on Eliquis.   No complications noted.    L humeral fx: Patient is in sling. Orthopedic input is appreciated, no surgery for now. Patient will follow with orthopedics in 2 weeks time.  A.Fib: Rate controlled. Continue Eliquis.  DM2: Continue to optimize. Patient is currently on subcu Lantus insulin 40 units twice daily Subcu dulaglutide 1.5 mg every week Sliding scale insulin coverage  Further management will depend on hospital course.  DVT prophylaxis: Eliquis Code Status: Full Family Communication:  Husband  Disposition Plan: Home after admit, possibly, in a.m. Consults called: Trauma surgery and Ortho   Procedures:   None  Antimicrobials:   None   Subjective: Pain control is not optimized.  Objective: Vitals:   03/07/18 0806  03/07/18 0836 03/07/18 1011 03/07/18 1536  BP:  123/79 123/79 131/71  Pulse:   60 71  Resp:    16  Temp:      TempSrc:      SpO2: 95%   94%  Weight:      Height:       No intake or output data in the 24 hours ending 03/07/18 1540 Filed Weights   03/05/18 1828 03/06/18 0300  Weight: 94.3 kg 93.7 kg    Examination:  General exam: Appears calm and comfortable. Respiratory system: Clear to auscultation.  Cardiovascular system: S1 & S2 . No pedal edema. Gastrointestinal system: Abdomen is nondistended, soft and nontender. No organomegaly or masses felt. Normal bowel sounds heard. Central nervous system: Alert and oriented. No focal neurological deficits.  Data Reviewed: I have personally reviewed following labs and imaging studies  CBC: Recent Labs  Lab 03/05/18 2105  WBC 11.5*  NEUTROABS 8.8*  HGB 14.2  HCT 44.0  MCV 92.1  PLT 094   Basic Metabolic Panel: Recent Labs  Lab 03/05/18 2105 03/06/18 0955  NA 141  --   K 4.0  --   CL 105  --   CO2 28  --   GLUCOSE 203* 489*  BUN 14  --   CREATININE 0.95  --   CALCIUM 8.9  --    GFR: Estimated Creatinine Clearance: 58.7 mL/min (by C-G formula based on SCr of 0.95 mg/dL). Liver Function Tests: No results for input(s): AST, ALT, ALKPHOS, BILITOT, PROT, ALBUMIN in the last 168 hours.  No results for input(s): LIPASE, AMYLASE in the last 168 hours. No results for input(s): AMMONIA in the last 168 hours. Coagulation Profile: No results for input(s): INR, PROTIME in the last 168 hours. Cardiac Enzymes: No results for input(s): CKTOTAL, CKMB, CKMBINDEX, TROPONINI in the last 168 hours. BNP (last 3 results) No results for input(s): PROBNP in the last 8760 hours. HbA1C: Recent Labs    03/05/18 2105  HGBA1C 10.4*   CBG: Recent Labs  Lab 03/06/18 1155 03/06/18 2100 03/07/18 0500 03/07/18 0637 03/07/18 1201  GLUCAP 386* 269* 216* 292* 261*   Lipid Profile: No results for input(s): CHOL, HDL, LDLCALC, TRIG,  CHOLHDL, LDLDIRECT in the last 72 hours. Thyroid Function Tests: No results for input(s): TSH, T4TOTAL, FREET4, T3FREE, THYROIDAB in the last 72 hours. Anemia Panel: No results for input(s): VITAMINB12, FOLATE, FERRITIN, TIBC, IRON, RETICCTPCT in the last 72 hours. Urine analysis:    Component Value Date/Time   COLORURINE YELLOW 10/16/2017 1720   APPEARANCEUR CLEAR 10/16/2017 1720   APPEARANCEUR Clear 10/01/2017 1424   LABSPEC 1.008 10/16/2017 1720   PHURINE 6.0 10/16/2017 1720   GLUCOSEU NEGATIVE 10/16/2017 1720   HGBUR NEGATIVE 10/16/2017 1720   BILIRUBINUR NEGATIVE 10/16/2017 1720   BILIRUBINUR Negative 10/01/2017 1424   KETONESUR NEGATIVE 10/16/2017 1720   PROTEINUR NEGATIVE 10/16/2017 1720   UROBILINOGEN 0.2 07/23/2015 1350   NITRITE NEGATIVE 10/16/2017 1720   LEUKOCYTESUR NEGATIVE 10/16/2017 1720   LEUKOCYTESUR Trace (A) 10/01/2017 1424   Sepsis Labs: @LABRCNTIP (procalcitonin:4,lacticidven:4)  ) Recent Results (from the past 240 hour(s))  WET PREP FOR TRICH, YEAST, CLUE     Status: None   Collection Time: 03/03/18 11:18 AM  Result Value Ref Range Status   Trichomonas Exam Negative Negative Final   Yeast Exam Negative Negative Final   Clue Cell Exam Negative Negative Final    Comment: WBC=RARE, BACTERIA=NONE SEEN, EPITHELIAL CELLS=FEW         Radiology Studies: Dg Ribs Unilateral W/chest Left  Result Date: 03/05/2018 CLINICAL DATA:  Fall EXAM: LEFT RIBS AND CHEST - 3+ VIEW COMPARISON:  10/14/2017 FINDINGS: Single-view chest demonstrates cardiomegaly. Dense mitral calcification. No pleural effusion or pneumothorax. Left proximal humerus fracture. Left rib series demonstrates acute left seventh through 10 rib fractures. IMPRESSION: 1. Negative for pneumothorax or pleural effusion.  Cardiomegaly 2. Acute left seventh through tenth rib fractures 3. Acute left proximal humerus fracture Electronically Signed   By: Donavan Foil M.D.   On: 03/05/2018 20:07   Dg Elbow  Complete Left  Result Date: 03/05/2018 CLINICAL DATA:  Fall EXAM: LEFT ELBOW - COMPLETE 3+ VIEW COMPARISON:  None. FINDINGS: There is no evidence of fracture, dislocation, or joint effusion. There is no evidence of arthropathy or other focal bone abnormality. Soft tissues are unremarkable. IMPRESSION: Negative. Electronically Signed   By: Donavan Foil M.D.   On: 03/05/2018 20:08   Ct Chest W Contrast  Result Date: 03/06/2018 CLINICAL DATA:  72 y/o F; mechanical fall with left shoulder injury. Chest trauma, blunt, aortic injury suspected. EXAM: CT CHEST WITH CONTRAST TECHNIQUE: Multidetector CT imaging of the chest was performed during intravenous contrast administration. CONTRAST:  92mL OMNIPAQUE IOHEXOL 300 MG/ML  SOLN COMPARISON:  12/24/2017 and 10/16/2017 CT chest. FINDINGS: Cardiovascular: No findings of acute aortic injury, dissection, aneurysm. Mild aortic calcific atherosclerosis. Mild coronary artery calcific atherosclerosis. Mitral annular calcification. Mild cardiomegaly. No pericardial effusion. Enlarged main pulmonary artery may represent pulmonary artery hypertension. Mediastinum/Nodes: No enlarged mediastinal, hilar, or axillary lymph nodes. Thyroid gland, trachea, and  esophagus demonstrate no significant findings. Lungs/Pleura: Several scattered 3-4 mm calcified pulmonary nodules compatible granulomatous disease. 3 mm pulmonary nodule in the right upper lobe (series 4, image 66). No consolidation, effusion, or pneumothorax. Upper Abdomen: Stable 9 mm nodule within the left adrenal gland compatible with adenoma. Musculoskeletal: Acute left proximal humerus minimally displaced fracture. No joint dislocation. Left 7 through 10 rib fractures demonstrate callus formation and likely chronic. No acute rib fracture identified. Chronic fracture deformity of the right proximal humerus. Stable mild chronic loss of height of the T11 and T12 vertebral bodies. IMPRESSION: 1. Acute left proximal humerus  minimally displaced fracture. No joint dislocation. 2. Left 7 through 10 rib fractures demonstrate callus formation and are likely chronic. No additional fracture identified. 3. No acute pulmonary process. 4. Enlarged main pulmonary artery may represent pulmonary artery hypertension. 5. Mild aortic and coronary artery calcific atherosclerosis. 6. Stable left adrenal adenoma. 7. Stable chronic fracture deformity of right proximal humerus, partially visualized. Stable mild loss of height of T11 and T12 vertebral bodies. Electronically Signed   By: Kristine Garbe M.D.   On: 03/06/2018 02:33   Dg Chest Port 1 View  Result Date: 03/06/2018 CLINICAL DATA:  Chest injury following a fall. Probable chronic left 7th through 10th rib fractures on a recent chest CT. There is also a chronic fracture deformity of the proximal right humerus as well as an acute proximal left humerus fracture. EXAM: PORTABLE CHEST 1 VIEW COMPARISON:  Chest CT dated 03/06/2018. Chest and left rib radiographs dated 03/05/2018. FINDINGS: Stable enlarged heart and mildly prominent interstitial markings. Mild increase in prominence of the pulmonary vasculature. Previously demonstrated acute left humeral neck fracture and old, healed right humeral neck and head fracture. No acute rib fractures visible today. IMPRESSION: 1. No acute abnormality. 2. Stable cardiomegaly and mild chronic interstitial lung disease with interval mild pulmonary vascular congestion. 3. Previously demonstrated acute left humeral neck fracture and old, healed right humeral neck and head fracture. Electronically Signed   By: Claudie Revering M.D.   On: 03/06/2018 07:53   Dg Shoulder Left  Result Date: 03/05/2018 CLINICAL DATA:  Fall EXAM: LEFT SHOULDER - 2+ VIEW COMPARISON:  None. FINDINGS: AC joint is intact. Acute mildly impacted fracture at the left humeral neck. No dislocation of the humeral head. IMPRESSION: Acute slightly impacted and displaced left humeral neck  fracture Electronically Signed   By: Donavan Foil M.D.   On: 03/05/2018 20:10   Dg Humerus Left  Result Date: 03/05/2018 CLINICAL DATA:  Fall EXAM: LEFT HUMERUS - 2+ VIEW COMPARISON:  None. FINDINGS: AC joint is intact. Acute mildly comminuted left humeral neck fracture with mild posterior angulation of distal fracture fragment and about 1/3 bone with of displacement toward the midline. IMPRESSION: Acute comminuted impacted and slightly displaced left proximal humerus fracture Electronically Signed   By: Donavan Foil M.D.   On: 03/05/2018 20:09        Scheduled Meds: . acetaminophen  1,000 mg Oral Q8H  . apixaban  5 mg Oral BID  . ARIPiprazole  30 mg Oral Daily  . atorvastatin  40 mg Oral q1800  . diltiazem  120 mg Oral Daily  . Dulaglutide  1.5 mg Subcutaneous Weekly  . fenofibrate  160 mg Oral Daily  . furosemide  20 mg Oral BID  . insulin aspart  0-15 Units Subcutaneous TID WC  . insulin glargine  40 Units Subcutaneous BID  . LORazepam  1 mg Oral Daily  . LORazepam  2 mg Oral QHS  . metoprolol tartrate  125 mg Oral BID  . mometasone-formoterol  2 puff Inhalation BID  . oxyCODONE  10 mg Oral Q12H  . pregabalin  150 mg Oral BID  . traZODone  300 mg Oral QHS   Continuous Infusions:   LOS: 0 days    Time spent: 25 minutes.    Dana Allan, MD  [facility] Pager #: [phone] 7PM-7AM contact night coverage as above

## 2018-03-07 NOTE — Discharge Instructions (Signed)

## 2018-03-07 NOTE — Progress Notes (Signed)
PT Brief Evaluation:  Pt evaluation completed. Pt is able to perform transfers with supervision to Min guard, gait with supervision and LE strength is grossly 4/5. Pt does continue to have significant pain limiting her ability to safely perform bed mobility without assistance. Pt will require OT evaluation for education regarding sling management and ADL's. Pt is still recovering from right shoulder surgery in January of 2019. PT recommends HHPT and OT upon discharge.   Completed evaluation to follow.  Scheryl Marten PT, DPT

## 2018-03-07 NOTE — Progress Notes (Addendum)
[facility] Progress Note     Subjective: CC:  C/o left arm/shoulder pain as well as rib pain, states arm pain worse than ribs. Did not feel oxycontin helped her pain. At basline takes 5-325 percocet for R shoulder pain - states had surgery R shoulder one year ago. Just finished working with PT. Reports pulling 1200 cc on IS.   Objective: Vital signs in last 24 hours: Temp:  [98 F (36.7 C)-98.2 F (36.8 C)] 98 F (36.7 C) (10/06 0450) Pulse Rate:  [60-76] 60 (10/06 0450) Resp:  [16-20] 18 (10/06 0450) BP: (119-133)/(72-79) 123/79 (10/06 0836) SpO2:  [93 %-97 %] 95 % (10/06 0806) Last BM Date: 03/05/18  Intake/Output from previous day: 10/05 0701 - 10/06 0700 In: 480 [P.O.:480] Out: -  Intake/Output this shift: No intake/output data recorded.  PE: Gen:  Alert, NAD, pleasant Card:  Regular rate and rhythm, pedal pulses 2+ BL Pulm:  Normal effort, clear to auscultation bilaterally Abd: Soft, non-tender, non-distended, bowel sounds present Skin: warm and dry, no rashes  MSK: LUE in sling  Psych: A&Ox3   Lab Results:  Recent Labs    03/05/18 2105  WBC 11.5*  HGB 14.2  HCT 44.0  PLT 185   BMET Recent Labs    03/05/18 2105 03/06/18 0955  NA 141  --   K 4.0  --   CL 105  --   CO2 28  --   GLUCOSE 203* 489*  BUN 14  --   CREATININE 0.95  --   CALCIUM 8.9  --    PT/INR No results for input(s): LABPROT, INR in the last 72 hours. CMP     Component Value Date/Time   NA 141 03/05/2018 2105   NA 138 05/27/2017 1319   K 4.0 03/05/2018 2105   CL 105 03/05/2018 2105   CO2 28 03/05/2018 2105   GLUCOSE 489 (H) 03/06/2018 0955   BUN 14 03/05/2018 2105   BUN 26 05/27/2017 1319   CREATININE 0.95 03/05/2018 2105   CALCIUM 8.9 03/05/2018 2105   PROT 6.8 10/16/2017 1600   PROT 7.5 10/07/2016 1120   ALBUMIN 3.8 10/16/2017 1600   ALBUMIN 4.6 10/07/2016 1120   AST 19 10/16/2017 1600   ALT 17 10/16/2017 1600   ALKPHOS 67 10/16/2017 1600   BILITOT 0.6  10/16/2017 1600   BILITOT 0.6 10/07/2016 1120   GFRNONAA 59 (L) 03/05/2018 2105   GFRAA >60 03/05/2018 2105   Lipase     Component Value Date/Time   LIPASE 27 03/17/2016 1614       Studies/Results: Dg Ribs Unilateral W/chest Left  Result Date: 03/05/2018 CLINICAL DATA:  Fall EXAM: LEFT RIBS AND CHEST - 3+ VIEW COMPARISON:  10/14/2017 FINDINGS: Single-view chest demonstrates cardiomegaly. Dense mitral calcification. No pleural effusion or pneumothorax. Left proximal humerus fracture. Left rib series demonstrates acute left seventh through 10 rib fractures. IMPRESSION: 1. Negative for pneumothorax or pleural effusion.  Cardiomegaly 2. Acute left seventh through tenth rib fractures 3. Acute left proximal humerus fracture Electronically Signed   By: Donavan Foil M.D.   On: 03/05/2018 20:07   Dg Elbow Complete Left  Result Date: 03/05/2018 CLINICAL DATA:  Fall EXAM: LEFT ELBOW - COMPLETE 3+ VIEW COMPARISON:  None. FINDINGS: There is no evidence of fracture, dislocation, or joint effusion. There is no evidence of arthropathy or other focal bone abnormality. Soft tissues are unremarkable. IMPRESSION: Negative. Electronically Signed   By: Donavan Foil M.D.   On: 03/05/2018 20:08  Ct Chest W Contrast  Result Date: 03/06/2018 CLINICAL DATA:  72 y/o F; mechanical fall with left shoulder injury. Chest trauma, blunt, aortic injury suspected. EXAM: CT CHEST WITH CONTRAST TECHNIQUE: Multidetector CT imaging of the chest was performed during intravenous contrast administration. CONTRAST:  109mL OMNIPAQUE IOHEXOL 300 MG/ML  SOLN COMPARISON:  12/24/2017 and 10/16/2017 CT chest. FINDINGS: Cardiovascular: No findings of acute aortic injury, dissection, aneurysm. Mild aortic calcific atherosclerosis. Mild coronary artery calcific atherosclerosis. Mitral annular calcification. Mild cardiomegaly. No pericardial effusion. Enlarged main pulmonary artery may represent pulmonary artery hypertension.  Mediastinum/Nodes: No enlarged mediastinal, hilar, or axillary lymph nodes. Thyroid gland, trachea, and esophagus demonstrate no significant findings. Lungs/Pleura: Several scattered 3-4 mm calcified pulmonary nodules compatible granulomatous disease. 3 mm pulmonary nodule in the right upper lobe (series 4, image 66). No consolidation, effusion, or pneumothorax. Upper Abdomen: Stable 9 mm nodule within the left adrenal gland compatible with adenoma. Musculoskeletal: Acute left proximal humerus minimally displaced fracture. No joint dislocation. Left 7 through 10 rib fractures demonstrate callus formation and likely chronic. No acute rib fracture identified. Chronic fracture deformity of the right proximal humerus. Stable mild chronic loss of height of the T11 and T12 vertebral bodies. IMPRESSION: 1. Acute left proximal humerus minimally displaced fracture. No joint dislocation. 2. Left 7 through 10 rib fractures demonstrate callus formation and are likely chronic. No additional fracture identified. 3. No acute pulmonary process. 4. Enlarged main pulmonary artery may represent pulmonary artery hypertension. 5. Mild aortic and coronary artery calcific atherosclerosis. 6. Stable left adrenal adenoma. 7. Stable chronic fracture deformity of right proximal humerus, partially visualized. Stable mild loss of height of T11 and T12 vertebral bodies. Electronically Signed   By: Kristine Garbe M.D.   On: 03/06/2018 02:33   Dg Chest Port 1 View  Result Date: 03/06/2018 CLINICAL DATA:  Chest injury following a fall. Probable chronic left 7th through 10th rib fractures on a recent chest CT. There is also a chronic fracture deformity of the proximal right humerus as well as an acute proximal left humerus fracture. EXAM: PORTABLE CHEST 1 VIEW COMPARISON:  Chest CT dated 03/06/2018. Chest and left rib radiographs dated 03/05/2018. FINDINGS: Stable enlarged heart and mildly prominent interstitial markings. Mild increase  in prominence of the pulmonary vasculature. Previously demonstrated acute left humeral neck fracture and old, healed right humeral neck and head fracture. No acute rib fractures visible today. IMPRESSION: 1. No acute abnormality. 2. Stable cardiomegaly and mild chronic interstitial lung disease with interval mild pulmonary vascular congestion. 3. Previously demonstrated acute left humeral neck fracture and old, healed right humeral neck and head fracture. Electronically Signed   By: Claudie Revering M.D.   On: 03/06/2018 07:53   Dg Shoulder Left  Result Date: 03/05/2018 CLINICAL DATA:  Fall EXAM: LEFT SHOULDER - 2+ VIEW COMPARISON:  None. FINDINGS: AC joint is intact. Acute mildly impacted fracture at the left humeral neck. No dislocation of the humeral head. IMPRESSION: Acute slightly impacted and displaced left humeral neck fracture Electronically Signed   By: Donavan Foil M.D.   On: 03/05/2018 20:10   Dg Humerus Left  Result Date: 03/05/2018 CLINICAL DATA:  Fall EXAM: LEFT HUMERUS - 2+ VIEW COMPARISON:  None. FINDINGS: AC joint is intact. Acute mildly comminuted left humeral neck fracture with mild posterior angulation of distal fracture fragment and about 1/3 bone with of displacement toward the midline. IMPRESSION: Acute comminuted impacted and slightly displaced left proximal humerus fracture Electronically Signed   By: Donavan Foil  M.D.   On: 03/05/2018 20:09    Anti-infectives: Anti-infectives (From admission, onward)   None     Assessment/Plan Fall L Rib FX 7-10 - multimodal pain control and IS; schedule tylenol, increase oxy IR scale, add robaxin. I do not think we have exhausted our short-acting narcotic and non-narcotic options and I do not recommend oxycontin but I will defer this to the primary team. L humeral FX - pain control, sling, F/U Dr. Veverly Fells 2 weeks  DM  CHF HTN   Plan - if pain is controlled and PT/OT have cleared the patient then she may be discharged home with plans to  follow up with ortho in 2 weeks. She may call our trauma office as needed with questions/concerns but does not need formal follow up for rib fractures. She should continue to use IS at home and advance activity as tolerated.    LOS: 0 days    Obie Dredge, [facility] Pager: [phone]

## 2018-03-07 NOTE — Evaluation (Signed)
Physical Therapy Evaluation Patient Details Name: Amber Stephenson MRN: [id number] DOB: Feb 27, 1946 Today's Date: 03/07/2018   History of Present Illness  Pt is a 72 yo female admitted through ED on 03/05/18 following a fall resulting in a left humerus and rib fxs. 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   Clinical Impression  Pt presents with the above diagnosis and below deficits for PT evaluation. Prior to admission, pt lived with her husband in a two story home with main living environment on the first floor. Pt was still recovering from a surgery to her right shoulder since January of this year. Pt requires supervision for transfers and gait, but Mod to Max A for bed mobs due to pain and limited use of UE's. Pt will benefit from continued PT services in order to address the below deficits in order to assist with a return to PLOF.     Follow Up Recommendations Home health PT    Equipment Recommendations  None recommended by PT    Recommendations for Other Services OT consult     Precautions / Restrictions Precautions Precautions: Fall Required Braces or Orthoses: Sling Restrictions Weight Bearing Restrictions: Yes LUE Weight Bearing: Non weight bearing      Mobility  Bed Mobility Overal bed mobility: Needs Assistance Bed Mobility: Sit to Supine       Sit to supine: Mod assist;+2 for physical assistance   General bed mobility comments: Min A x1 for sit to supine, Max A x 2 for moving up in the bed.   Transfers Overall transfer level: Needs assistance Equipment used: None Transfers: Sit to/from Stand Sit to Stand: Supervision         General transfer comment: for safety  Ambulation/Gait Ambulation/Gait assistance: Supervision Gait Distance (Feet): 50 Feet Assistive device: None Gait Pattern/deviations: Step-through pattern Gait velocity: decreased   General Gait  Details: step through sequencing, no LOB   Stairs            Wheelchair Mobility    Modified Rankin (Stroke Patients Only)       Balance Overall balance assessment: Mild deficits observed, not formally tested                                           Pertinent Vitals/Pain Pain Assessment: 0-10 Pain Score: 9  Pain Location: left shoulder and side Pain Descriptors / Indicators: Sharp Pain Intervention(s): Limited activity within patient's tolerance;Monitored during session;Premedicated before session;Repositioned    Home Living Family/patient expects to be discharged to:: Private residence Living Arrangements: Spouse/significant other Available Help at Discharge: Family;Available 24 hours/day Type of Home: House Home Access: Stairs to enter Entrance Stairs-Rails: Left Entrance Stairs-Number of Steps: 2 Home Layout: Two level;Full bath on main level;Able to live on main level with bedroom/bathroom Home Equipment: None      Prior Function Level of Independence: Independent               Hand Dominance   Dominant Hand: Right    Extremity/Trunk Assessment   Upper Extremity Assessment Upper Extremity Assessment: Defer to OT evaluation    Lower Extremity Assessment Lower Extremity Assessment: Overall WFL for tasks assessed    Cervical / Trunk Assessment Cervical / Trunk Assessment: Normal  Communication   Communication: No difficulties  Cognition Arousal/Alertness: Awake/alert  Behavior During Therapy: WFL for tasks assessed/performed Overall Cognitive Status: Within Functional Limits for tasks assessed                                        General Comments General comments (skin integrity, edema, etc.): Pt has a history of long term opiod use. Pain management has been an issue this hospital stay. Pt is unsure of removing sling to perform bathing. Requires follow-up from OT.     Exercises     Assessment/Plan     PT Assessment Patient needs continued PT services  PT Problem List Decreased activity tolerance;Decreased balance;Decreased mobility;Pain       PT Treatment Interventions Gait training;Stair training;Functional mobility training;Therapeutic activities;Therapeutic exercise;Balance training;Patient/family education    PT Goals (Current goals can be found in the Care Plan section)  Acute Rehab PT Goals Patient Stated Goal: to have less pain PT Goal Formulation: With patient Time For Goal Achievement: 03/14/18 Potential to Achieve Goals: Good    Frequency Min 5X/week   Barriers to discharge        Co-evaluation               AM-PAC PT "6 Clicks" Daily Activity  Outcome Measure Difficulty turning over in bed (including adjusting bedclothes, sheets and blankets)?: Unable Difficulty moving from lying on back to sitting on the side of the bed? : Unable Difficulty sitting down on and standing up from a chair with arms (e.g., wheelchair, bedside commode, etc,.)?: A Lot Help needed moving to and from a bed to chair (including a wheelchair)?: A Little Help needed walking in hospital room?: A Little Help needed climbing 3-5 steps with a railing? : A Little 6 Click Score: 13    End of Session Equipment Utilized During Treatment: Gait belt;Other (comment)(sling LUE) Activity Tolerance: Patient limited by pain Patient left: in bed;with call bell/phone within reach Nurse Communication: Mobility status;Patient requests pain meds PT Visit Diagnosis: Unsteadiness on feet (R26.81);Pain Pain - Right/Left: Left Pain - part of body: Shoulder    Time: 2979-8921 PT Time Calculation (min) (ACUTE ONLY): 38 min   Charges:   PT Evaluation $PT Eval Moderate Complexity: 1 Mod PT Treatments $Gait Training: 8-22 mins $Therapeutic Activity: 8-22 mins        Scheryl Marten PT, DPT     Cynai Skeens Sloan Leiter 03/07/2018, 1:10 PM

## 2018-03-08 ENCOUNTER — Other Ambulatory Visit (HOSPITAL_COMMUNITY): Payer: Self-pay

## 2018-03-08 DIAGNOSIS — S42302A Unspecified fracture of shaft of humerus, left arm, initial encounter for closed fracture: Secondary | ICD-10-CM | POA: Diagnosis not present

## 2018-03-08 DIAGNOSIS — S2242XA Multiple fractures of ribs, left side, initial encounter for closed fracture: Secondary | ICD-10-CM | POA: Diagnosis not present

## 2018-03-08 DIAGNOSIS — E1169 Type 2 diabetes mellitus with other specified complication: Secondary | ICD-10-CM | POA: Diagnosis not present

## 2018-03-08 DIAGNOSIS — I10 Essential (primary) hypertension: Secondary | ICD-10-CM | POA: Diagnosis not present

## 2018-03-08 DIAGNOSIS — S42292A Other displaced fracture of upper end of left humerus, initial encounter for closed fracture: Secondary | ICD-10-CM | POA: Diagnosis not present

## 2018-03-08 DIAGNOSIS — Z794 Long term (current) use of insulin: Secondary | ICD-10-CM | POA: Diagnosis not present

## 2018-03-08 LAB — GLUCOSE, CAPILLARY
Glucose-Capillary: 262 mg/dL — ABNORMAL HIGH (ref 70–99)
Glucose-Capillary: 232 mg/dL — ABNORMAL HIGH (ref 70–99)
Glucose-Capillary: 266 mg/dL — ABNORMAL HIGH (ref 70–99)

## 2018-03-08 MED ORDER — LORAZEPAM 1 MG PO TABS: ORAL_TABLET | ORAL | 0 refills | Status: DC

## 2018-03-08 MED ORDER — METHOCARBAMOL 500 MG PO TABS: 500.0000 mg | ORAL_TABLET | Freq: Three times a day (TID) | ORAL | 0 refills | Status: DC | PRN

## 2018-03-08 NOTE — Evaluation (Signed)
Occupational Therapy Evaluation Patient Details Name: Amber Stephenson MRN: [id number] DOB: [birth year] Today's Date: 03/08/2018    History of Present Illness Pt is a 72 yo female admitted through ED on 03/05/18 following a fall resulting in a left humerus and rib fxs. 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    Clinical Impression   PTA Pt got assist PRN for ADL from husband. Pt is currently max A for ADL involving BUE tasks. Pt educated in gentle shoulder exercises for hand, wrist, elbow (coming out of sling). Educated in sling management/positioning, showering, dressing, and sleeping. Pt had additional questions about her recliner at home (she has trouble getting out of it) and bathing. Able to answer all question for Pt and husband. Shoulder dc handout provided as a good reference tool (explained differences). Pt would benefit from skilled OT at the [facility] level (as Pt is set to dc today) prior to her follow up appointment as she seems to have poor memory recall and asks LOTS of questions (most of them repeated at least once).     Follow Up Recommendations  Follow surgeon's recommendation for DC plan and follow-up therapies;Supervision/Assistance - 24 hour    Equipment Recommendations  None recommended by OT    Recommendations for Other Services       Precautions / Restrictions Precautions Precautions: Fall Required Braces or Orthoses: Sling Restrictions Weight Bearing Restrictions: Yes LUE Weight Bearing: Non weight bearing      Mobility Bed Mobility               General bed mobility comments: pt OOB in chair upon arrival  Transfers Overall transfer level: Needs assistance Equipment used: None Transfers: Sit to/from Stand Sit to Stand: Min guard         General transfer comment: min guard for safety; no physical assist needed    Balance Overall balance assessment: Mild  deficits observed, not formally tested                                         ADL either performed or assessed with clinical judgement   ADL                                         General ADL Comments: please see shoulder section below     Vision Patient Visual Report: No change from baseline       Perception     Praxis      Pertinent Vitals/Pain Pain Assessment: 0-10 Pain Score: 8  Faces Pain Scale: Hurts little more Pain Location: L shoulder Pain Descriptors / Indicators: Guarding;Sore;Grimacing;Sharp Pain Intervention(s): Monitored during session;Repositioned;Patient requesting pain meds-RN notified;Ice applied     Hand Dominance Right   Extremity/Trunk Assessment Upper Extremity Assessment Upper Extremity Assessment: LUE deficits/detail;RUE deficits/detail RUE Deficits / Details: generalized weakness and decreased ROM from fall in Jan LUE Deficits / Details: immobilized, very painful for any movement at this time - even in hand LUE: Unable to fully assess due to immobilization;Unable to fully assess due to pain LUE Sensation: WNL LUE Coordination: decreased fine motor;decreased gross motor   Lower Extremity Assessment Lower Extremity Assessment: Generalized weakness   Cervical / Trunk Assessment Cervical /  Trunk Assessment: Normal   Communication Communication Communication: No difficulties   Cognition Arousal/Alertness: Awake/alert Behavior During Therapy: WFL for tasks assessed/performed Overall Cognitive Status: Within Functional Limits for tasks assessed Area of Impairment: Problem solving                             Problem Solving: Slow processing;Decreased initiation     General Comments  Provided and reviewed shoulder handout (explained differences and  how it should be used as a resource). Husband present throughout.    Exercises Exercises: Shoulder Shoulder Exercises Elbow Flexion:  PROM;AROM;AAROM;Left;Seated(educated - did not perform due to pain) Elbow Extension: PROM;AROM;AAROM;Left;Seated(educated - did not perform due to pain) Wrist Flexion: AROM;AAROM;Left Wrist Extension: AROM;AAROM;Left Digit Composite Flexion: AROM;Left Composite Extension: AROM;Left Neck Flexion: AROM Neck Extension: AROM Neck Lateral Flexion - Right: AROM Neck Lateral Flexion - Left: AROM   Shoulder Instructions Shoulder Instructions Donning/doffing shirt without moving shoulder: Maximal assistance;Caregiver independent with task;Patient able to independently direct caregiver Method for sponge bathing under operated UE: Maximal assistance;Caregiver independent with task;Patient able to independently direct caregiver Donning/doffing sling/immobilizer: Maximal assistance;Caregiver independent with task;Patient able to independently direct caregiver Correct positioning of sling/immobilizer: Maximal assistance;Caregiver independent with task;Patient able to independently direct caregiver ROM for elbow, wrist and digits of operated UE: Moderate assistance(educated only- unable to perform due to pain) Sling wearing schedule (on at all times/off for ADL's): Supervision/safety Proper positioning of operated UE when showering: Moderate assistance;Caregiver independent with task;Patient able to independently direct caregiver Positioning of UE while sleeping: Moderate assistance;Caregiver independent with task;Patient able to independently direct caregiver    Home Living Family/patient expects to be discharged to:: Private residence Living Arrangements: Spouse/significant other Available Help at Discharge: Family;Available 24 hours/day Type of Home: House Home Access: Stairs to enter [employer] of Steps: 2 Entrance Stairs-Rails: Left Home Layout: Two level;Full bath on main level;Able to live on main level with bedroom/bathroom     Bathroom Shower/Tub: Radiographer, therapeutic: Standard Bathroom Accessibility: Yes How Accessible: Accessible via walker Home Equipment: Trail in          Prior Functioning/Environment Level of Independence: Independent        Comments: having trouble with R shoulder though        OT Problem List: Decreased range of motion;Decreased activity tolerance;Impaired balance (sitting and/or standing);Decreased knowledge of precautions;Obesity;Impaired UE functional use;Pain      OT Treatment/Interventions:      OT Goals(Current goals can be found in the care plan section) Acute Rehab OT Goals Patient Stated Goal: to have less pain OT Goal Formulation: With patient/family Time For Goal Achievement: 03/22/18 Potential to Achieve Goals: Good  OT Frequency:     Barriers to D/C:            Co-evaluation              AM-PAC PT "6 Clicks" Daily Activity     Outcome Measure Help from another person eating meals?: A Little Help from another person taking care of personal grooming?: A Little Help from another person toileting, which includes using toliet, bedpan, or urinal?: A Lot Help from another person bathing (including washing, rinsing, drying)?: A Lot Help from another person to put on and taking off regular upper body clothing?: A Lot Help from another person to put on and taking off regular lower body clothing?: A Lot 6 Click Score: 14   End of  Session Equipment Utilized During Treatment: Other (comment)(sling) Nurse Communication: Mobility status;Patient requests pain meds;Weight bearing status  Activity Tolerance: Patient limited by pain Patient left: in chair;with call bell/phone within reach;with family/visitor present  OT Visit Diagnosis: Pain Pain - Right/Left: Left Pain - part of body: Shoulder                Time: 1018-1100 OT Time Calculation (min): 42 min Charges:  OT General Charges $OT Visit: 1 Visit OT Evaluation $OT Eval Moderate Complexity: [place]  OTR/L Acute Rehabilitation Services Pager: [phone] Office: [place] 03/08/2018, 2:22 PM

## 2018-03-08 NOTE — Progress Notes (Signed)
  [facility] Progress Note     Subjective: CC-  Sitting up in chair. Continues to complain of pain from left proximal humerus fracture and rib fractures, shoulder pain worse than ribs. Denies SOB. Pulling 1100 on IS. Denies n/t in LUE. Worked with PT yesterday, recommending home health PT. OT consult pending.  Objective: Vital signs in last 24 hours: Temp:  [98.1 F (36.7 C)-98.5 F (36.9 C)] 98.1 F (36.7 C) (10/07 0442) Pulse Rate:  [60-100] 100 (10/07 0442) Resp:  [15-16] 15 (10/07 0442) BP: (118-131)/(59-79) 118/59 (10/07 0442) SpO2:  [94 %-99 %] 97 % (10/07 0442) Last BM Date: 03/05/18  Intake/Output from previous day: 10/06 0701 - 10/07 0700 In: 120 [P.O.:120] Out: -  Intake/Output this shift: No intake/output data recorded.  PE: Gen:  Alert, NAD, pleasant HEENT: EOM's intact, pupils equal and round Card:  RRR Pulm:  CTAB, no W/R/R, effort normal. Pulling 1100 on IS Abd: Soft, NT/ND, +BS Ext:  Calves soft and nontender. LUE in sling, fingers WWP, 2+ radial pulse, no gross sensory or motor deficits Psych: A&Ox3  Skin: no rashes noted, warm and dry  Lab Results:  Recent Labs    03/05/18 2105  WBC 11.5*  HGB 14.2  HCT 44.0  PLT 185   BMET Recent Labs    03/05/18 2105 03/06/18 0955  NA 141  --   K 4.0  --   CL 105  --   CO2 28  --   GLUCOSE 203* 489*  BUN 14  --   CREATININE 0.95  --   CALCIUM 8.9  --    PT/INR No results for input(s): LABPROT, INR in the last 72 hours. CMP     Component Value Date/Time   NA 141 03/05/2018 2105   NA 138 05/27/2017 1319   K 4.0 03/05/2018 2105   CL 105 03/05/2018 2105   CO2 28 03/05/2018 2105   GLUCOSE 489 (H) 03/06/2018 0955   BUN 14 03/05/2018 2105   BUN 26 05/27/2017 1319   CREATININE 0.95 03/05/2018 2105   CALCIUM 8.9 03/05/2018 2105   PROT 6.8 10/16/2017 1600   PROT 7.5 10/07/2016 1120   ALBUMIN 3.8 10/16/2017 1600   ALBUMIN 4.6 10/07/2016 1120   AST 19 10/16/2017 1600   ALT 17  10/16/2017 1600   ALKPHOS 67 10/16/2017 1600   BILITOT 0.6 10/16/2017 1600   BILITOT 0.6 10/07/2016 1120   GFRNONAA 59 (L) 03/05/2018 2105   GFRAA >60 03/05/2018 2105   Lipase     Component Value Date/Time   LIPASE 27 03/17/2016 1614       Studies/Results: No results found.  Anti-infectives: Anti-infectives (From admission, onward)   None       Assessment/Plan Fall L Rib FX 7-10 - multimodal pain control and pulmonary toilet; f/u CXR 10/5 stable L proximal humerus FX - pain control, sling, NWB LUE. F/U Dr. Veverly Fells 2 weeks  DM  CHF HTN  Chronic pain  ID - none FEN - CM Diet VTE - SCDs, eliquis  Plan - OT consult pending. If cleared by therapy and pain is controlled then the patient may be discharged home with ortho f/u in 2 weeks. F/u with trauma as needed. Continue IS at home.   LOS: 0 days    Wellington [place] , [facility] 03/08/2018, 8:58 AM Pager: [phone] Mon 7:00 am -11:30 AM Tues-Fri 7:00 am-4:30 pm Sat-Sun 7:00 am-11:30 am

## 2018-03-08 NOTE — Plan of Care (Signed)
  Problem: Education: Goal: Knowledge of General Education information will improve Description: Including pain rating scale, medication(s)/side effects and non-pharmacologic comfort measures Outcome: Progressing   Problem: Health Behavior/Discharge Planning: Goal: Ability to manage health-related needs will improve Outcome: Progressing   Problem: Clinical Measurements: Goal: Ability to maintain clinical measurements within normal limits will improve Outcome: Progressing Goal: Will remain free from infection Outcome: Progressing Goal: Diagnostic test results will improve Outcome: Progressing Goal: Respiratory complications will improve Outcome: Progressing Goal: Cardiovascular complication will be avoided Outcome: Progressing   Problem: Activity: Goal: Risk for activity intolerance will decrease Outcome: Progressing   Problem: Nutrition: Goal: Adequate nutrition will be maintained Outcome: Progressing   Problem: Coping: Goal: Level of anxiety will decrease Outcome: Progressing   Problem: Elimination: Goal: Will not experience complications related to bowel motility Outcome: Progressing Goal: Will not experience complications related to urinary retention Outcome: Progressing   Problem: Pain Managment: Goal: General experience of comfort will improve Outcome: Progressing   Problem: Safety: Goal: Ability to remain free from injury will improve Outcome: Progressing   Problem: Skin Integrity: Goal: Risk for impaired skin integrity will decrease Outcome: Progressing   Problem: Education: Goal: Knowledge of the prescribed therapeutic regimen will improve Outcome: Progressing Goal: Understanding of activity limitations/precautions following surgery will improve Outcome: Progressing Goal: Individualized Educational Video(s) Outcome: Progressing   Problem: Activity: Goal: Ability to tolerate increased activity will improve Outcome: Progressing   Problem: Pain  Management: Goal: Pain level will decrease with appropriate interventions Outcome: Progressing   

## 2018-03-08 NOTE — Care Management Note (Signed)
Case Management Note  Patient Details  Name: Amber Stephenson MRN: [id number] Date of Birth: [birth year]  Subjective/Objective:    Left Humeral Fracture                Action/Plan: NCM spoke to pt and offered choice for [facility]. Pt may benefit from [place] program. Budd Palmer to arrange [facility]. Pt has cane at home.   Expected Discharge Date:  03/08/18               Expected Discharge Plan:  [name]  In-House Referral:  NA  Discharge planning Services  CM Consult  Post Acute Care Choice:  Home Health Choice offered to:  Patient  DME Arranged:  N/A DME Agency:  NA  HH Arranged:  PT, OT, Nurse's Aide, Social Work [employer] Agency:  [place]  Status of Service:  Completed, signed off  If discussed at [employer], dates discussed:    Additional Comments:  Erenest Rasher, RN 03/08/2018, 12:58 PM

## 2018-03-08 NOTE — Plan of Care (Signed)
  Problem: Education: Goal: Knowledge of General Education information will improve Description Including pain rating scale, medication(s)/side effects and non-pharmacologic comfort measures 03/08/2018 1229 by Rance Muir, RN Outcome: Adequate for Discharge 03/08/2018 1109 by Rance Muir, RN Outcome: Progressing   Problem: Health Behavior/Discharge Planning: Goal: Ability to manage health-related needs will improve 03/08/2018 1229 by Rance Muir, RN Outcome: Adequate for Discharge 03/08/2018 1109 by Rance Muir, RN Outcome: Progressing   Problem: Clinical Measurements: Goal: Ability to maintain clinical measurements within normal limits will improve 03/08/2018 1229 by Rance Muir, RN Outcome: Adequate for Discharge 03/08/2018 1109 by Rance Muir, RN Outcome: Progressing Goal: Will remain free from infection 03/08/2018 1229 by Rance Muir, RN Outcome: Adequate for Discharge 03/08/2018 1109 by Rance Muir, RN Outcome: Progressing Goal: Diagnostic test results will improve 03/08/2018 1229 by Rance Muir, RN Outcome: Adequate for Discharge 03/08/2018 1109 by Rance Muir, RN Outcome: Progressing Goal: Respiratory complications will improve 03/08/2018 1229 by Rance Muir, RN Outcome: Adequate for Discharge 03/08/2018 1109 by Rance Muir, RN Outcome: Progressing Goal: Cardiovascular complication will be avoided 03/08/2018 1229 by Rance Muir, RN Outcome: Adequate for Discharge 03/08/2018 1109 by Rance Muir, RN Outcome: Progressing   Problem: Activity: Goal: Risk for activity intolerance will decrease 03/08/2018 1229 by Rance Muir, RN Outcome: Adequate for Discharge 03/08/2018 1109 by Rance Muir, RN Outcome: Progressing   Problem: Nutrition: Goal: Adequate nutrition will be maintained 03/08/2018 1229 by Rance Muir, RN Outcome: Adequate for Discharge 03/08/2018 1109 by Rance Muir, RN Outcome: Progressing   Problem: Coping: Goal: Level of anxiety will decrease 03/08/2018 1229 by Rance Muir, RN Outcome: Adequate for  Discharge 03/08/2018 1109 by Rance Muir, RN Outcome: Progressing   Problem: Elimination: Goal: Will not experience complications related to bowel motility 03/08/2018 1229 by Rance Muir, RN Outcome: Adequate for Discharge 03/08/2018 1109 by Rance Muir, RN Outcome: Progressing Goal: Will not experience complications related to urinary retention 03/08/2018 1229 by Rance Muir, RN Outcome: Adequate for Discharge 03/08/2018 1109 by Rance Muir, RN Outcome: Progressing   Problem: Pain Managment: Goal: General experience of comfort will improve 03/08/2018 1229 by Rance Muir, RN Outcome: Adequate for Discharge 03/08/2018 1109 by Rance Muir, RN Outcome: Progressing   Problem: Safety: Goal: Ability to remain free from injury will improve 03/08/2018 1229 by Rance Muir, RN Outcome: Adequate for Discharge 03/08/2018 1109 by Rance Muir, RN Outcome: Progressing   Problem: Skin Integrity: Goal: Risk for impaired skin integrity will decrease 03/08/2018 1229 by Rance Muir, RN Outcome: Adequate for Discharge 03/08/2018 1109 by Rance Muir, RN Outcome: Progressing   Problem: Education: Goal: Knowledge of the prescribed therapeutic regimen will improve 03/08/2018 1229 by Rance Muir, RN Outcome: Adequate for Discharge 03/08/2018 1109 by Rance Muir, RN Outcome: Progressing Goal: Understanding of activity limitations/precautions following surgery will improve 03/08/2018 1229 by Rance Muir, RN Outcome: Adequate for Discharge 03/08/2018 1109 by Rance Muir, RN Outcome: Progressing Goal: Individualized Educational Video(s) 03/08/2018 1229 by Rance Muir, RN Outcome: Adequate for Discharge 03/08/2018 1109 by Rance Muir, RN Outcome: Progressing   Problem: Activity: Goal: Ability to tolerate increased activity will improve 03/08/2018 1229 by Rance Muir, RN Outcome: Adequate for Discharge 03/08/2018 1109 by Rance Muir, RN Outcome: Progressing   Problem: Pain Management: Goal: Pain level will decrease with appropriate  interventions 03/08/2018 1229 by Rance Muir, RN Outcome: Adequate for Discharge 03/08/2018 1109 by Rance Muir, RN Outcome: Progressing

## 2018-03-08 NOTE — Discharge Summary (Signed)
Physician Discharge Summary  Amber Stephenson OHY:[id number] DOB: [birth year] DOA: 03/05/2018  PCP: [name], DO  Admit date: 03/05/2018 Discharge date: 03/08/2018  Time spent: >35 minutes  Recommendations for Outpatient Follow-up:  Orthopedics in 2 weeks PCP in 3-7 days  Pain clinic in 7-10 days  Discharge Diagnoses:  Principal Problem:   Left humeral fracture Active Problems:   DM2 (diabetes mellitus, type 2) ([place])   Permanent atrial fibrillation   Essential hypertension   Fracture of four ribs of left side, closed, initial encounter   Discharge Condition: stable   Diet recommendation: low sodium, carb modified   Filed Weights   03/05/18 1828 03/06/18 0300  Weight: 94.3 kg 93.7 kg    History of present illness:   72 y.o.femalewith medical history significant ofA.Fib on eliquis, DM, chronic back pain.Patient was admitted with mechanical fall, left humerus fracture, and left-sided rib cage pain.   Hospital Course:   Fall - with 4 L rib fx and L humeral fx:CT chest revealed that the rib fractures were old. Patient is seen by surgery. Recommended to f/u with DR. Norris in 2 weeks D/w patient her family. Pt follows at pain clinic. Reviewed her records in opioid use data base. Last prescription on 9/25 for percocet 60 tablets. Pt will cont to follow with pain clinic. I have recommended to deescalate her pain regimen as possible   L humeral fx: Patient is in sling. Orthopedic input is appreciated, no surgery for now. Patient will follow with orthopedics in 2 weeks time.  A.Fib: Rate controlled. Home regimen on cardizem and metoprolol. Continue Eliquis.  DM2:  Stable cont home regimen   Procedures:  none (i.e. Studies not automatically included, echos, thoracentesis, etc; not x-rays)  Consultations:  Orthopedics  Surgery   Discharge Exam: Vitals:   03/08/18 0442 03/08/18 0933  BP: (!) 118/59   Pulse: 100   Resp: 15   Temp: 98.1 F (36.7 C)    SpO2: 97% 96%    General: no distress  Cardiovascular: s1,s2 rrr Respiratory: CTA BL  Discharge Instructions  Discharge Instructions    Diet - low sodium heart healthy   Complete by:  As directed    Increase activity slowly   Complete by:  As directed      Allergies as of 03/08/2018      Reactions   Ivp Dye [iodinated Diagnostic Agents] Anaphylaxis, Swelling   Throat closes      Medication List    STOP taking these medications   SUMAtriptan 100 MG tablet Commonly known as:  IMITREX     TAKE these medications   apixaban 5 MG Tabs tablet Commonly known as:  ELIQUIS Take 1 tablet (5 mg total) by mouth 2 (two) times daily.   ARIPiprazole 10 MG tablet Commonly known as:  ABILIFY TAKE 3 TABLETS EVERY MORNING What changed:    how much to take  how to take this  when to take this  additional instructions   atorvastatin 40 MG tablet Commonly known as:  LIPITOR TAKE 1 TABLET DAILY What changed:  when to take this   budesonide-formoterol 80-4.5 MCG/ACT inhaler Commonly known as:  SYMBICORT Inhale 2 puffs into the lungs 2 (two) times daily.   diltiazem 120 MG 24 hr capsule Commonly known as:  CARDIZEM CD Take 1 capsule (120 mg total) by mouth at bedtime.   Dulaglutide 1.5 MG/0.5ML Sopn Inject 1.5 mg into the skin once a week.   freestyle lancets Test tid. DX E11.22  FREESTYLE LITE Devi Use to check blood sugar tid. DX E11.22   FREESTYLE LITE test strip Generic drug:  glucose blood USE FOR TESTING SUGAR THREE TIMES DAILY   furosemide 20 MG tablet Commonly known as:  LASIX Take 1 tablet (20 mg total) by mouth 2 (two) times daily.   Insulin Glargine 100 UNIT/ML Solostar Pen Commonly known as:  LANTUS INJECT 60 UNITS UNDER THE SKIN TWICE A DAY What changed:    how much to take  how to take this  when to take this  additional instructions   Insulin Pen Needle 32G X 4 MM Misc Use with insulin pens as directed. Dx E11.9   LORazepam 1 MG  tablet Commonly known as:  ATIVAN Take 1 tab (1 mg) in the morning and 2 tabs (2 mg ) at bedtime What changed:    how much to take  how to take this  when to take this   methocarbamol 500 MG tablet Commonly known as:  ROBAXIN Take 1 tablet (500 mg total) by mouth every 8 (eight) hours as needed for muscle spasms.   metoprolol tartrate 100 MG tablet Commonly known as:  LOPRESSOR TAKE 1 TABLET TWICE A DAY What changed:  additional instructions   metoprolol tartrate 25 MG tablet Commonly known as:  LOPRESSOR TAKE 1 TABLET TWICE A DAY (TO BE TAKEN ALONG WITH THE LOPRESSOR 100 MG TWICE A DAY) What changed:  See the new instructions.   NARCAN 4 MG/0.1ML Liqd nasal spray kit Generic drug:  naloxone Place 1 spray into the nose once as needed (opiod overdose).   oxyCODONE-acetaminophen 5-325 MG tablet Commonly known as:  PERCOCET/ROXICET Take 1 tablet by mouth every 8 (eight) hours.   pregabalin 150 MG capsule Commonly known as:  LYRICA Take 1 capsule (150 mg total) by mouth 2 (two) times daily.   rizatriptan 10 MG disintegrating tablet Commonly known as:  MAXALT-MLT DISSOLVE 1 TABLET UNDER THE TONGUE IMMEDIATELY AT SIGN OF HEADACHE. MAY REPEAT DOSE IN 1 HOUR IF NEEDED. MAX OF 3 TABLETS IN 24 HOURS What changed:    how much to take  how to take this  when to take this  reasons to take this  additional instructions   traZODone 100 MG tablet Commonly known as:  DESYREL Take 3 tablets (300 mg total) by mouth at bedtime.   TRICOR 145 MG tablet Generic drug:  fenofibrate TAKE 1 TABLET DAILY What changed:  how much to take   Vitamin D3 1000 units Caps Take 1,000 Units by mouth daily.      Allergies  Allergen Reactions  . Ivp Dye [Iodinated Diagnostic Agents] Anaphylaxis and Swelling    Throat closes   Follow-up Information    [facility] [place]. Call.   Why:  Call as needed, you do not have to schedule an appointment Contact information: [place] [phone]           The results of significant diagnostics from this hospitalization (including imaging, microbiology, ancillary and laboratory) are listed below for reference.    Significant Diagnostic Studies: Dg Ribs Unilateral W/chest Left  Result Date: 03/05/2018 CLINICAL DATA:  Fall EXAM: LEFT RIBS AND CHEST - 3+ VIEW COMPARISON:  10/14/2017 FINDINGS: Single-view chest demonstrates cardiomegaly. Dense mitral calcification. No pleural effusion or pneumothorax. Left proximal humerus fracture. Left rib series demonstrates acute left seventh through 10 rib fractures. IMPRESSION: 1. Negative for pneumothorax or pleural effusion.  Cardiomegaly 2. Acute  left seventh through tenth rib fractures 3. Acute left proximal humerus fracture Electronically Signed   By: Donavan Foil M.D.   On: 03/05/2018 20:07   Dg Elbow Complete Left  Result Date: 03/05/2018 CLINICAL DATA:  Fall EXAM: LEFT ELBOW - COMPLETE 3+ VIEW COMPARISON:  None. FINDINGS: There is no evidence of fracture, dislocation, or joint effusion. There is no evidence of arthropathy or other focal bone abnormality. Soft tissues are unremarkable. IMPRESSION: Negative. Electronically Signed   By: Donavan Foil M.D.   On: 03/05/2018 20:08   Ct Chest W Contrast  Result Date: 03/06/2018 CLINICAL DATA:  72 y/o F; mechanical fall with left shoulder injury. Chest trauma, blunt, aortic injury suspected. EXAM: CT CHEST WITH CONTRAST TECHNIQUE: Multidetector CT imaging of the chest was performed during intravenous contrast administration. CONTRAST:  71m OMNIPAQUE IOHEXOL 300 MG/ML  SOLN COMPARISON:  12/24/2017 and 10/16/2017 CT chest. FINDINGS: Cardiovascular: No findings of acute aortic injury, dissection, aneurysm. Mild aortic calcific atherosclerosis. Mild coronary artery calcific atherosclerosis. Mitral annular calcification. Mild cardiomegaly. No pericardial effusion. Enlarged main pulmonary  artery may represent pulmonary artery hypertension. Mediastinum/Nodes: No enlarged mediastinal, hilar, or axillary lymph nodes. Thyroid gland, trachea, and esophagus demonstrate no significant findings. Lungs/Pleura: Several scattered 3-4 mm calcified pulmonary nodules compatible granulomatous disease. 3 mm pulmonary nodule in the right upper lobe (series 4, image 66). No consolidation, effusion, or pneumothorax. Upper Abdomen: Stable 9 mm nodule within the left adrenal gland compatible with adenoma. Musculoskeletal: Acute left proximal humerus minimally displaced fracture. No joint dislocation. Left 7 through 10 rib fractures demonstrate callus formation and likely chronic. No acute rib fracture identified. Chronic fracture deformity of the right proximal humerus. Stable mild chronic loss of height of the T11 and T12 vertebral bodies. IMPRESSION: 1. Acute left proximal humerus minimally displaced fracture. No joint dislocation. 2. Left 7 through 10 rib fractures demonstrate callus formation and are likely chronic. No additional fracture identified. 3. No acute pulmonary process. 4. Enlarged main pulmonary artery may represent pulmonary artery hypertension. 5. Mild aortic and coronary artery calcific atherosclerosis. 6. Stable left adrenal adenoma. 7. Stable chronic fracture deformity of right proximal humerus, partially visualized. Stable mild loss of height of T11 and T12 vertebral bodies. Electronically Signed   By: LKristine GarbeM.D.   On: 03/06/2018 02:33   Dg Chest Port 1 View  Result Date: 03/06/2018 CLINICAL DATA:  Chest injury following a fall. Probable chronic left 7th through 10th rib fractures on a recent chest CT. There is also a chronic fracture deformity of the proximal right humerus as well as an acute proximal left humerus fracture. EXAM: PORTABLE CHEST 1 VIEW COMPARISON:  Chest CT dated 03/06/2018. Chest and left rib radiographs dated 03/05/2018. FINDINGS: Stable enlarged heart and  mildly prominent interstitial markings. Mild increase in prominence of the pulmonary vasculature. Previously demonstrated acute left humeral neck fracture and old, healed right humeral neck and head fracture. No acute rib fractures visible today. IMPRESSION: 1. No acute abnormality. 2. Stable cardiomegaly and mild chronic interstitial lung disease with interval mild pulmonary vascular congestion. 3. Previously demonstrated acute left humeral neck fracture and old, healed right humeral neck and head fracture. Electronically Signed   By: SClaudie ReveringM.D.   On: 03/06/2018 07:53   Dg Shoulder Left  Result Date: 03/05/2018 CLINICAL DATA:  Fall EXAM: LEFT SHOULDER - 2+ VIEW COMPARISON:  None. FINDINGS: AC joint is intact. Acute mildly impacted fracture at the left humeral neck. No dislocation of the humeral head. IMPRESSION:  Acute slightly impacted and displaced left humeral neck fracture Electronically Signed   By: Donavan Foil M.D.   On: 03/05/2018 20:10   Dg Humerus Left  Result Date: 03/05/2018 CLINICAL DATA:  Fall EXAM: LEFT HUMERUS - 2+ VIEW COMPARISON:  None. FINDINGS: AC joint is intact. Acute mildly comminuted left humeral neck fracture with mild posterior angulation of distal fracture fragment and about 1/3 bone with of displacement toward the midline. IMPRESSION: Acute comminuted impacted and slightly displaced left proximal humerus fracture Electronically Signed   By: Donavan Foil M.D.   On: 03/05/2018 20:09    Microbiology: Recent Results (from the past 240 hour(s))  WET PREP FOR [place], YEAST, CLUE     Status: None   Collection Time: 03/03/18 11:18 AM  Result Value Ref Range Status   Trichomonas Exam Negative Negative Final   Yeast Exam Negative Negative Final   Clue Cell Exam Negative Negative Final    Comment: WBC=RARE, BACTERIA=NONE SEEN, EPITHELIAL CELLS=FEW     Labs: Basic Metabolic Panel: Recent Labs  Lab 03/05/18 2105 03/06/18 0955  NA 141  --   K 4.0  --   CL 105  --    CO2 28  --   GLUCOSE 203* 489*  BUN 14  --   CREATININE 0.95  --   CALCIUM 8.9  --    Liver Function Tests: No results for input(s): AST, ALT, ALKPHOS, BILITOT, PROT, ALBUMIN in the last 168 hours. No results for input(s): LIPASE, AMYLASE in the last 168 hours. No results for input(s): AMMONIA in the last 168 hours. CBC: Recent Labs  Lab 03/05/18 2105  WBC 11.5*  NEUTROABS 8.8*  HGB 14.2  HCT 44.0  MCV 92.1  PLT 185   Cardiac Enzymes: No results for input(s): CKTOTAL, CKMB, CKMBINDEX, TROPONINI in the last 168 hours. BNP: BNP (last 3 results) Recent Labs    10/16/17 1600  BNP 185.0*    ProBNP (last 3 results) No results for input(s): PROBNP in the last 8760 hours.  CBG: Recent Labs  Lab 03/07/18 0637 03/07/18 1201 03/07/18 1629 03/07/18 2121 03/08/18 0631  GLUCAP 292* 261* 216* 284* 232*     Signed:  Rowe Clack N  [facility] 03/08/2018, 12:16 PM

## 2018-03-08 NOTE — Progress Notes (Signed)
Physical Therapy Treatment Patient Details Name: Amber Stephenson MRN: [id number] DOB: [birth year] Today's Date: 03/08/2018    History of Present Illness Pt is a 72 yo female admitted through ED on 03/05/18 following a fall resulting in a left humerus and rib fxs. 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     PT Comments    Patient is making progress toward PT goals. Pt overall requires min guard for safety with functional transfers and gait and min A for ascending/descending stairs. Current plan remains appropriate.    Follow Up Recommendations  Home health PT     Equipment Recommendations  None recommended by PT    Recommendations for Other Services OT consult     Precautions / Restrictions Precautions Precautions: Fall Required Braces or Orthoses: Sling Restrictions Weight Bearing Restrictions: Yes LUE Weight Bearing: Non weight bearing    Mobility  Bed Mobility               General bed mobility comments: pt OOB in chair upon arrival  Transfers Overall transfer level: Needs assistance Equipment used: None Transfers: Sit to/from Stand Sit to Stand: Min guard         General transfer comment: min guard for safety; no physical assist needed  Ambulation/Gait Ambulation/Gait assistance: Min guard Gait Distance (Feet): 80 Feet Assistive device: None Gait Pattern/deviations: Step-through pattern;Decreased stride length(lateral sway) Gait velocity: decreased   General Gait Details: decreased cadence and stride length; min guard for safety   Stairs Stairs: Yes Stairs assistance: Min assist Stair Management: No rails;Step to pattern;Forwards Number of Stairs: 3 General stair comments: cues for safety; HHA +1 for balance    Wheelchair Mobility    Modified Rankin (Stroke Patients Only)       Balance Overall balance assessment: Mild deficits observed, not  formally tested                                          Cognition Arousal/Alertness: Awake/alert Behavior During Therapy: WFL for tasks assessed/performed Overall Cognitive Status: Within Functional Limits for tasks assessed Area of Impairment: Problem solving                             Problem Solving: Slow processing;Decreased initiation        Exercises      General Comments        Pertinent Vitals/Pain Pain Assessment: Faces Faces Pain Scale: Hurts little more Pain Location: L shoulder Pain Descriptors / Indicators: Guarding;Sore Pain Intervention(s): Monitored during session;Repositioned;Premedicated before session    Home Living                      Prior Function            PT Goals (current goals can now be found in the care plan section) Progress towards PT goals: Progressing toward goals    Frequency    Min 5X/week      PT Plan Current plan remains appropriate    Co-evaluation              AM-PAC PT "6 Clicks" Daily Activity  Outcome Measure  Difficulty turning over in bed (including adjusting bedclothes, sheets and blankets)?: Unable Difficulty moving from lying on back to  sitting on the side of the bed? : Unable Difficulty sitting down on and standing up from a chair with arms (e.g., wheelchair, bedside commode, etc,.)?: A Lot Help needed moving to and from a bed to chair (including a wheelchair)?: A Little Help needed walking in hospital room?: A Little Help needed climbing 3-5 steps with a railing? : A Little 6 Click Score: 13    End of Session Equipment Utilized During Treatment: Gait belt;Other (comment)(sling LUE) Activity Tolerance: Patient limited by pain Patient left: with call bell/phone within reach;in chair;with family/visitor present Nurse Communication: Mobility status PT Visit Diagnosis: Unsteadiness on feet (R26.81);Pain Pain - Right/Left: Left Pain - part of body: Shoulder      Time: 2863-8177 PT Time Calculation (min) (ACUTE ONLY): 15 min  Charges:  $Gait Training: 8-22 mins                     Earney Navy, PTA Acute Rehabilitation Services Pager: [phone] Office: [phone]     Darliss Cheney 03/08/2018, 12:00 PM

## 2018-03-09 DIAGNOSIS — S42202D Unspecified fracture of upper end of left humerus, subsequent encounter for fracture with routine healing: Secondary | ICD-10-CM | POA: Diagnosis not present

## 2018-03-09 DIAGNOSIS — S2242XD Multiple fractures of ribs, left side, subsequent encounter for fracture with routine healing: Secondary | ICD-10-CM | POA: Diagnosis not present

## 2018-03-10 ENCOUNTER — Other Ambulatory Visit: Payer: Self-pay | Admitting: *Deleted

## 2018-03-10 DIAGNOSIS — S2242XD Multiple fractures of ribs, left side, subsequent encounter for fracture with routine healing: Secondary | ICD-10-CM | POA: Diagnosis not present

## 2018-03-10 DIAGNOSIS — Z79899 Other long term (current) drug therapy: Secondary | ICD-10-CM | POA: Diagnosis not present

## 2018-03-10 DIAGNOSIS — W19XXXA Unspecified fall, initial encounter: Secondary | ICD-10-CM | POA: Diagnosis not present

## 2018-03-10 DIAGNOSIS — G894 Chronic pain syndrome: Secondary | ICD-10-CM | POA: Diagnosis not present

## 2018-03-10 DIAGNOSIS — S42202D Unspecified fracture of upper end of left humerus, subsequent encounter for fracture with routine healing: Secondary | ICD-10-CM | POA: Diagnosis not present

## 2018-03-10 DIAGNOSIS — M25512 Pain in left shoulder: Secondary | ICD-10-CM | POA: Diagnosis not present

## 2018-03-10 MED ORDER — FENOFIBRATE 145 MG PO TABS: 145.0000 mg | ORAL_TABLET | Freq: Every day | ORAL | 0 refills | Status: DC

## 2018-03-11 ENCOUNTER — Other Ambulatory Visit: Payer: Self-pay | Admitting: *Deleted

## 2018-03-11 DIAGNOSIS — S2242XD Multiple fractures of ribs, left side, subsequent encounter for fracture with routine healing: Secondary | ICD-10-CM | POA: Diagnosis not present

## 2018-03-11 DIAGNOSIS — S42202D Unspecified fracture of upper end of left humerus, subsequent encounter for fracture with routine healing: Secondary | ICD-10-CM | POA: Diagnosis not present

## 2018-03-11 MED ORDER — APIXABAN 5 MG PO TABS: 5.0000 mg | ORAL_TABLET | Freq: Two times a day (BID) | ORAL | 0 refills | Status: DC

## 2018-03-13 ENCOUNTER — Other Ambulatory Visit: Payer: Self-pay | Admitting: Family Medicine

## 2018-03-15 NOTE — Telephone Encounter (Signed)
Last lipid 03/17/16

## 2018-03-16 DIAGNOSIS — M25511 Pain in right shoulder: Secondary | ICD-10-CM | POA: Diagnosis not present

## 2018-03-16 DIAGNOSIS — S42202A Unspecified fracture of upper end of left humerus, initial encounter for closed fracture: Secondary | ICD-10-CM | POA: Insufficient documentation

## 2018-03-16 DIAGNOSIS — S2242XD Multiple fractures of ribs, left side, subsequent encounter for fracture with routine healing: Secondary | ICD-10-CM | POA: Diagnosis not present

## 2018-03-16 DIAGNOSIS — S42202D Unspecified fracture of upper end of left humerus, subsequent encounter for fracture with routine healing: Secondary | ICD-10-CM | POA: Diagnosis not present

## 2018-03-18 DIAGNOSIS — S2242XD Multiple fractures of ribs, left side, subsequent encounter for fracture with routine healing: Secondary | ICD-10-CM | POA: Diagnosis not present

## 2018-03-18 DIAGNOSIS — S42202D Unspecified fracture of upper end of left humerus, subsequent encounter for fracture with routine healing: Secondary | ICD-10-CM | POA: Diagnosis not present

## 2018-03-19 DIAGNOSIS — S2242XD Multiple fractures of ribs, left side, subsequent encounter for fracture with routine healing: Secondary | ICD-10-CM | POA: Diagnosis not present

## 2018-03-19 DIAGNOSIS — S42202D Unspecified fracture of upper end of left humerus, subsequent encounter for fracture with routine healing: Secondary | ICD-10-CM | POA: Diagnosis not present

## 2018-03-23 DIAGNOSIS — S2242XD Multiple fractures of ribs, left side, subsequent encounter for fracture with routine healing: Secondary | ICD-10-CM | POA: Diagnosis not present

## 2018-03-23 DIAGNOSIS — S42202D Unspecified fracture of upper end of left humerus, subsequent encounter for fracture with routine healing: Secondary | ICD-10-CM | POA: Diagnosis not present

## 2018-03-24 ENCOUNTER — Ambulatory Visit (INDEPENDENT_AMBULATORY_CARE_PROVIDER_SITE_OTHER): Payer: Medicare Other

## 2018-03-24 DIAGNOSIS — M797 Fibromyalgia: Secondary | ICD-10-CM

## 2018-03-24 DIAGNOSIS — Z7901 Long term (current) use of anticoagulants: Secondary | ICD-10-CM

## 2018-03-24 DIAGNOSIS — G2581 Restless legs syndrome: Secondary | ICD-10-CM

## 2018-03-24 DIAGNOSIS — F319 Bipolar disorder, unspecified: Secondary | ICD-10-CM

## 2018-03-24 DIAGNOSIS — M549 Dorsalgia, unspecified: Secondary | ICD-10-CM | POA: Diagnosis not present

## 2018-03-24 DIAGNOSIS — Z79891 Long term (current) use of opiate analgesic: Secondary | ICD-10-CM

## 2018-03-24 DIAGNOSIS — E1140 Type 2 diabetes mellitus with diabetic neuropathy, unspecified: Secondary | ICD-10-CM

## 2018-03-24 DIAGNOSIS — G43109 Migraine with aura, not intractable, without status migrainosus: Secondary | ICD-10-CM

## 2018-03-24 DIAGNOSIS — I110 Hypertensive heart disease with heart failure: Secondary | ICD-10-CM | POA: Diagnosis not present

## 2018-03-24 DIAGNOSIS — J4520 Mild intermittent asthma, uncomplicated: Secondary | ICD-10-CM | POA: Diagnosis not present

## 2018-03-24 DIAGNOSIS — E785 Hyperlipidemia, unspecified: Secondary | ICD-10-CM

## 2018-03-24 DIAGNOSIS — I5032 Chronic diastolic (congestive) heart failure: Secondary | ICD-10-CM

## 2018-03-24 DIAGNOSIS — S42202D Unspecified fracture of upper end of left humerus, subsequent encounter for fracture with routine healing: Secondary | ICD-10-CM

## 2018-03-24 DIAGNOSIS — G4700 Insomnia, unspecified: Secondary | ICD-10-CM

## 2018-03-24 DIAGNOSIS — S2242XD Multiple fractures of ribs, left side, subsequent encounter for fracture with routine healing: Secondary | ICD-10-CM

## 2018-03-24 DIAGNOSIS — Z794 Long term (current) use of insulin: Secondary | ICD-10-CM

## 2018-03-24 DIAGNOSIS — G8929 Other chronic pain: Secondary | ICD-10-CM | POA: Diagnosis not present

## 2018-03-24 DIAGNOSIS — I4821 Permanent atrial fibrillation: Secondary | ICD-10-CM

## 2018-03-24 DIAGNOSIS — K760 Fatty (change of) liver, not elsewhere classified: Secondary | ICD-10-CM

## 2018-03-25 ENCOUNTER — Ambulatory Visit (HOSPITAL_COMMUNITY): Payer: Self-pay

## 2018-03-26 DIAGNOSIS — S2242XD Multiple fractures of ribs, left side, subsequent encounter for fracture with routine healing: Secondary | ICD-10-CM | POA: Diagnosis not present

## 2018-03-26 DIAGNOSIS — S42202D Unspecified fracture of upper end of left humerus, subsequent encounter for fracture with routine healing: Secondary | ICD-10-CM | POA: Diagnosis not present

## 2018-03-30 ENCOUNTER — Ambulatory Visit (INDEPENDENT_AMBULATORY_CARE_PROVIDER_SITE_OTHER): Payer: Medicare Other | Admitting: Psychiatry

## 2018-03-30 ENCOUNTER — Encounter (HOSPITAL_COMMUNITY): Payer: Self-pay | Admitting: Psychiatry

## 2018-03-30 VITALS — BP 110/77 | HR 84 | Ht 62.0 in | Wt 206.0 lb

## 2018-03-30 DIAGNOSIS — I2510 Atherosclerotic heart disease of native coronary artery without angina pectoris: Secondary | ICD-10-CM

## 2018-03-30 DIAGNOSIS — S42202D Unspecified fracture of upper end of left humerus, subsequent encounter for fracture with routine healing: Secondary | ICD-10-CM | POA: Diagnosis not present

## 2018-03-30 DIAGNOSIS — F3130 Bipolar disorder, current episode depressed, mild or moderate severity, unspecified: Secondary | ICD-10-CM | POA: Diagnosis not present

## 2018-03-30 DIAGNOSIS — M25511 Pain in right shoulder: Secondary | ICD-10-CM | POA: Diagnosis not present

## 2018-03-30 MED ORDER — ESZOPICLONE 3 MG PO TABS: 3.0000 mg | ORAL_TABLET | Freq: Every evening | ORAL | 4 refills | Status: DC | PRN

## 2018-03-30 MED ORDER — LORAZEPAM 1 MG PO TABS: ORAL_TABLET | ORAL | 3 refills | Status: DC

## 2018-03-30 MED ORDER — TRAZODONE HCL 100 MG PO TABS: ORAL_TABLET | ORAL | 3 refills | Status: DC

## 2018-03-30 MED ORDER — ESZOPICLONE 2 MG PO TABS: 3.0000 mg | ORAL_TABLET | Freq: Every evening | ORAL | Status: DC | PRN

## 2018-03-30 MED ORDER — ARIPIPRAZOLE 10 MG PO TABS: 30.0000 mg | ORAL_TABLET | Freq: Every day | ORAL | 3 refills | Status: DC

## 2018-03-30 NOTE — Progress Notes (Signed)
Patient ID: Amber Stephenson, female   DOB: 15-May-1946, 72 y.o.   MRN: [id number] [facility] MD Progress Note .she's going to apply 03/30/2018 4:17 PM Amber Stephenson  MRN:  [id number] Subjective:  Doing well Principal Problem: Bipolar disorder most recent episode manic Diagnosis:  Bipolar disorder most recent   Today the patient actually is doing well.  This is despite the fact that she fell and broke her left arm.  She comes in a sling.  She is status post surgery.  Because of the surgery she cannot sleep flat.  She was already having a difficult time sleeping.  It should be noted that she is going to have a sleep study in the coming months once her arm heals.  Nonetheless poor sleep has been a big problem for her and now it is worse since she can sleep flat in the bed.  It should be noted that her husband also fell and broke some ribs.  Amazingly despite these problems the patient's mood is stable.  She is resilient.  She denies daily depression.  Is not irritable.  Her appetite is good her energy level is reasonably good.  She can still think and concentrate.  She still enjoys things.  She reads watches TV.  Patient has no evidence of psychotic symptoms.  She yet to start in any therapy.  Her daughter is not a big issue at this time.  In addition of doxepin did not help her sleep.  She takes her medicines just as prescribed.  She denies any psychotic symptoms.  She denies the use of alcohol or illicit drugs.  There is some confusion about her Tegretol.  Her last information is that she was taking 100 mg twice daily but the patient does not remember if she is taking in her present computer implies that she is not on it.  We will speak to the patient to clarify.  My wishes to continue the dose she was taking which I believe is 100 mg twice daily the patient shows no evidence of tardive dyskinesia.  She did not fall because she is too sedated.  On the Ativan at this time we will change her Abilify to taking it  all at night. Past Medical History:  Diagnosis Date  . Atrial fibrillation ([place])   . Bipolar affective ([place])   . CHF (congestive heart failure) ([place])   . Chronic back pain   . Diabetes mellitus without complication ([place])   . Hyperlipidemia   . Hypertension   . Pain management   . Renal insufficiency     Past Surgical History:  Procedure Laterality Date  . BREAST REDUCTION SURGERY    . EYE SURGERY Right    cateracts  . HAMMER TOE SURGERY    . LEFT HEART CATH AND CORONARY ANGIOGRAPHY N/A 02/03/2018   Procedure: LEFT HEART CATH AND CORONARY ANGIOGRAPHY;  Surgeon: Martinique, Peter M, MD;  Location: [place] [facility];  Service: Cardiovascular;  Laterality: N/A;  . SHOULDER SURGERY Right   . THIGH SURGERY     Family History:  Family History  Problem Relation Age of Onset  . Diabetes Mother   . Heart disease Mother   . Heart disease Brother   . Heart disease Sister        CABG  . Alcohol abuse Sister   . Mental illness Brother   . Diabetes Brother    Family Psychiatric  History:  Social History:  Social History   Substance and Sexual  Activity  Alcohol Use No  . Alcohol/week: 0.0 standard drinks     Social History   Substance and Sexual Activity  Drug Use No    Social History   Socioeconomic History  . Marital status: Married    Spouse name: Not on file  . Number of children: 3  . Years of education: 16  . Highest education level: Bachelor's degree (e.g., BA, AB, BS)  Occupational History  . Not on file  Social Needs  . Financial resource strain: Not hard at all  . Food insecurity:    Worry: Never true    Inability: Never true  . Transportation needs:    Medical: No    Non-medical: No  Tobacco Use  . Smoking status: Never Smoker  . Smokeless tobacco: Never Used  Substance and Sexual Activity  . Alcohol use: No    Alcohol/week: 0.0 standard drinks  . Drug use: No  . Sexual activity: Yes    Partners: Male    Birth control/protection: Post-menopausal   Lifestyle  . Physical activity:    Days per week: 0 days    Minutes per session: 0 min  . Stress: Not at all  Relationships  . Social connections:    Talks on phone: More than three times a week    Gets together: Once a week    Attends religious service: More than 4 times per year    Active member of club or organization: No    Attends meetings of clubs or organizations: Never    Relationship status: Married  Other Topics Concern  . Not on file  Social History Narrative   Lives at home with husband.    Additional Social History:                         Sleep: Good  Appetite:  Good  Current Medications: Current Outpatient Medications  Medication Sig Dispense Refill  . apixaban (ELIQUIS) 5 MG TABS tablet Take 1 tablet (5 mg total) by mouth 2 (two) times daily. (Needs to be seen before next refill) 180 tablet 0  . ARIPiprazole (ABILIFY) 10 MG tablet Take 3 tablets (30 mg total) by mouth daily. 90 tablet 3  . atorvastatin (LIPITOR) 40 MG tablet TAKE 1 TABLET DAILY 90 tablet 1  . Blood Glucose Monitoring Suppl (FREESTYLE LITE) DEVI Use to check blood sugar tid. DX E11.22 1 each 0  . budesonide-formoterol (SYMBICORT) 80-4.5 MCG/ACT inhaler Inhale 2 puffs into the lungs 2 (two) times daily. 2 Inhaler 0  . Cholecalciferol (VITAMIN D3) 1000 UNITS CAPS Take 1,000 Units by mouth daily.    Marland Kitchen diltiazem (CARDIZEM CD) 120 MG 24 hr capsule Take 1 capsule (120 mg total) by mouth at bedtime. 90 capsule 3  . Dulaglutide (TRULICITY) 1.5 UV/2.5DG SOPN Inject 1.5 mg into the skin once a week. 12 pen 3  . fenofibrate (TRICOR) 145 MG tablet Take 1 tablet (145 mg total) by mouth daily. (Needs to be seen before next refill) 90 tablet 0  . FREESTYLE LITE test strip USE FOR TESTING SUGAR THREE TIMES DAILY 100 each 3  . furosemide (LASIX) 20 MG tablet Take 1 tablet (20 mg total) by mouth 2 (two) times daily. 180 tablet 1  . Insulin Glargine (LANTUS SOLOSTAR) 100 UNIT/ML Solostar Pen INJECT 60  UNITS UNDER THE SKIN TWICE A DAY (Patient taking differently: Inject 60 Units into the skin 2 (two) times daily. ) 90 mL 0  . Insulin  Pen Needle (SURE COMFORT PEN NEEDLES) 32G X 4 MM MISC Use with insulin pens as directed. Dx E11.9 300 each 11  . Lancets (FREESTYLE) lancets Test tid. DX E11.22 100 each 5  . LORazepam (ATIVAN) 1 MG tablet Take 1 tab (1 mg) in the morning and 2 tabs (2 mg ) at bedtime 90 tablet 3  . methocarbamol (ROBAXIN) 500 MG tablet Take 1 tablet (500 mg total) by mouth every 8 (eight) hours as needed for muscle spasms. 15 tablet 0  . metoprolol tartrate (LOPRESSOR) 100 MG tablet TAKE 1 TABLET TWICE A DAY (Patient taking differently: Take 100 mg by mouth 2 (two) times daily. Take with metoprolol 25 mg to equal 125 mg) 180 tablet 3  . metoprolol tartrate (LOPRESSOR) 25 MG tablet TAKE 1 TABLET TWICE A DAY (TO BE TAKEN ALONG WITH THE LOPRESSOR 100 MG TWICE A DAY) (Patient taking differently: Take 25 mg by mouth 2 (two) times daily. Take with metoprolol 100 mg to equal 125 mg) 180 tablet 4  . naloxone (NARCAN) nasal spray 4 mg/0.1 mL Place 1 spray into the nose once as needed (opiod overdose).    . pregabalin (LYRICA) 150 MG capsule Take 1 capsule (150 mg total) by mouth 2 (two) times daily. 180 capsule 2  . rizatriptan (MAXALT-MLT) 10 MG disintegrating tablet DISSOLVE 1 TABLET UNDER THE TONGUE IMMEDIATELY AT SIGN OF HEADACHE. MAY REPEAT DOSE IN 1 HOUR IF NEEDED. MAX OF 3 TABLETS IN 24 HOURS (Patient taking differently: Take 10 mg by mouth as needed for migraine. ) 10 tablet 2  . traZODone (DESYREL) 100 MG tablet 4  qhs 120 tablet 3  . Eszopiclone (ESZOPICLONE) 3 MG TABS Take 1 tablet (3 mg total) by mouth at bedtime as needed. Take immediately before bedtime 30 tablet 4  . oxyCODONE-acetaminophen (PERCOCET/ROXICET) 5-325 MG tablet Take 1 tablet by mouth every 8 (eight) hours.     Current Facility-Administered Medications  Medication Dose Route Frequency Provider Last Rate Last Dose  .  eszopiclone (LUNESTA) tablet 3 mg  3 mg Oral QHS PRN Jaivon Vanbeek, Berneta Sages, MD        Lab Results: No results found for this or any previous visit (from the past 48 hour(s)).  Blood Alcohol level:  Lab Results  Component Value Date   ETH <5 10/01/2015    Physical Findings: AIMS:  , ,  ,  ,    CIWA:    COWS:     Musculoskeletal: Strength & Muscle Tone: within normal limits Gait & Station: normal Patient leans: N/A  Psychiatric Specialty Exam: ROS Patient ID: Amber Stephenson, female   DOB: 11/25/1945, 52 y.o.   MRN: [id number] [facility] MD Progress Note .she's going to apply 03/30/2018 4:22 PM Amber Stephenson  MRN:  [id number] Subjective:  Doing well Principal Problem: Bipolar disorder most recent episode manic Diagnosis:  Bipolar disorder most recent   Today the patient is seen with her husband. Actually this is about a month earlier than expected. The actually having no new problems. The patient does describe her anxiety level is somewhat higher bullous suspect is related to the fact that her daughter who is recovering out of his living with them now. Apparently does some degree of tension between her and her daughter who she is with every day. Patient also has multiple medical problems.She seen doctors every week having tests. The patient denies depression. She denies any signs of mania. She's not irritable. She is not 4. She does not have racing  thinking. Her appetite is normal. She does complain other sleep is not for her. She's having hard time staying asleep. She goes to sleep and takes 3 trazodone. Patient is no evidence of psychosis. She is not grandiose. She's not suicidal. Her husband is stable. They likely home they live in. The patient takes her medicines as prescribed. The patient drinks no alcohol uses no drugs. They're planning a trip to [place]. The patient shows no evidence of tardive dyskinesia. Past Medical History:  Diagnosis Date  . Atrial fibrillation ([place])   .  Bipolar affective ([place])   . CHF (congestive heart failure) ([place])   . Chronic back pain   . Diabetes mellitus without complication ([place])   . Hyperlipidemia   . Hypertension   . Pain management   . Renal insufficiency     Past Surgical History:  Procedure Laterality Date  . BREAST REDUCTION SURGERY    . EYE SURGERY Right    cateracts  . HAMMER TOE SURGERY    . LEFT HEART CATH AND CORONARY ANGIOGRAPHY N/A 02/03/2018   Procedure: LEFT HEART CATH AND CORONARY ANGIOGRAPHY;  Surgeon: Martinique, Peter M, MD;  Location: [place] [facility];  Service: Cardiovascular;  Laterality: N/A;  . SHOULDER SURGERY Right   . THIGH SURGERY     Family History:  Family History  Problem Relation Age of Onset  . Diabetes Mother   . Heart disease Mother   . Heart disease Brother   . Heart disease Sister        CABG  . Alcohol abuse Sister   . Mental illness Brother   . Diabetes Brother    Family Psychiatric  History:  Social History:  Social History   Substance and Sexual Activity  Alcohol Use No  . Alcohol/week: 0.0 standard drinks     Social History   Substance and Sexual Activity  Drug Use No    Social History   Socioeconomic History  . Marital status: Married    Spouse name: Not on file  . Number of children: 3  . Years of education: 16  . Highest education level: Bachelor's degree (e.g., BA, AB, BS)  Occupational History  . Not on file  Social Needs  . Financial resource strain: Not hard at all  . Food insecurity:    Worry: Never true    Inability: Never true  . Transportation needs:    Medical: No    Non-medical: No  Tobacco Use  . Smoking status: Never Smoker  . Smokeless tobacco: Never Used  Substance and Sexual Activity  . Alcohol use: No    Alcohol/week: 0.0 standard drinks  . Drug use: No  . Sexual activity: Yes    Partners: Male    Birth control/protection: Post-menopausal  Lifestyle  . Physical activity:    Days per week: 0 days    Minutes per session: 0 min   . Stress: Not at all  Relationships  . Social connections:    Talks on phone: More than three times a week    Gets together: Once a week    Attends religious service: More than 4 times per year    Active member of club or organization: No    Attends meetings of clubs or organizations: Never    Relationship status: Married  Other Topics Concern  . Not on file  Social History Narrative   Lives at home with husband.    Additional Social History:  Sleep: Good  Appetite:  Good  Current Medications: Current Outpatient Medications  Medication Sig Dispense Refill  . apixaban (ELIQUIS) 5 MG TABS tablet Take 1 tablet (5 mg total) by mouth 2 (two) times daily. (Needs to be seen before next refill) 180 tablet 0  . ARIPiprazole (ABILIFY) 10 MG tablet Take 3 tablets (30 mg total) by mouth daily. 90 tablet 3  . atorvastatin (LIPITOR) 40 MG tablet TAKE 1 TABLET DAILY 90 tablet 1  . Blood Glucose Monitoring Suppl (FREESTYLE LITE) DEVI Use to check blood sugar tid. DX E11.22 1 each 0  . budesonide-formoterol (SYMBICORT) 80-4.5 MCG/ACT inhaler Inhale 2 puffs into the lungs 2 (two) times daily. 2 Inhaler 0  . Cholecalciferol (VITAMIN D3) 1000 UNITS CAPS Take 1,000 Units by mouth daily.    Marland Kitchen diltiazem (CARDIZEM CD) 120 MG 24 hr capsule Take 1 capsule (120 mg total) by mouth at bedtime. 90 capsule 3  . Dulaglutide (TRULICITY) 1.5 QV/9.5GL SOPN Inject 1.5 mg into the skin once a week. 12 pen 3  . fenofibrate (TRICOR) 145 MG tablet Take 1 tablet (145 mg total) by mouth daily. (Needs to be seen before next refill) 90 tablet 0  . FREESTYLE LITE test strip USE FOR TESTING SUGAR THREE TIMES DAILY 100 each 3  . furosemide (LASIX) 20 MG tablet Take 1 tablet (20 mg total) by mouth 2 (two) times daily. 180 tablet 1  . Insulin Glargine (LANTUS SOLOSTAR) 100 UNIT/ML Solostar Pen INJECT 60 UNITS UNDER THE SKIN TWICE A DAY (Patient taking differently: Inject 60 Units into the skin 2  (two) times daily. ) 90 mL 0  . Insulin Pen Needle (SURE COMFORT PEN NEEDLES) 32G X 4 MM MISC Use with insulin pens as directed. Dx E11.9 300 each 11  . Lancets (FREESTYLE) lancets Test tid. DX E11.22 100 each 5  . LORazepam (ATIVAN) 1 MG tablet Take 1 tab (1 mg) in the morning and 2 tabs (2 mg ) at bedtime 90 tablet 3  . methocarbamol (ROBAXIN) 500 MG tablet Take 1 tablet (500 mg total) by mouth every 8 (eight) hours as needed for muscle spasms. 15 tablet 0  . metoprolol tartrate (LOPRESSOR) 100 MG tablet TAKE 1 TABLET TWICE A DAY (Patient taking differently: Take 100 mg by mouth 2 (two) times daily. Take with metoprolol 25 mg to equal 125 mg) 180 tablet 3  . metoprolol tartrate (LOPRESSOR) 25 MG tablet TAKE 1 TABLET TWICE A DAY (TO BE TAKEN ALONG WITH THE LOPRESSOR 100 MG TWICE A DAY) (Patient taking differently: Take 25 mg by mouth 2 (two) times daily. Take with metoprolol 100 mg to equal 125 mg) 180 tablet 4  . naloxone (NARCAN) nasal spray 4 mg/0.1 mL Place 1 spray into the nose once as needed (opiod overdose).    . pregabalin (LYRICA) 150 MG capsule Take 1 capsule (150 mg total) by mouth 2 (two) times daily. 180 capsule 2  . rizatriptan (MAXALT-MLT) 10 MG disintegrating tablet DISSOLVE 1 TABLET UNDER THE TONGUE IMMEDIATELY AT SIGN OF HEADACHE. MAY REPEAT DOSE IN 1 HOUR IF NEEDED. MAX OF 3 TABLETS IN 24 HOURS (Patient taking differently: Take 10 mg by mouth as needed for migraine. ) 10 tablet 2  . traZODone (DESYREL) 100 MG tablet 4  qhs 120 tablet 3  . Eszopiclone (ESZOPICLONE) 3 MG TABS Take 1 tablet (3 mg total) by mouth at bedtime as needed. Take immediately before bedtime 30 tablet 4  . oxyCODONE-acetaminophen (PERCOCET/ROXICET) 5-325 MG tablet Take 1 tablet  by mouth every 8 (eight) hours.     Current Facility-Administered Medications  Medication Dose Route Frequency Provider Last Rate Last Dose  . eszopiclone (LUNESTA) tablet 3 mg  3 mg Oral QHS PRN Ivori Storr, Berneta Sages, MD        Lab  Results: No results found for this or any previous visit (from the past 48 hour(s)).  Blood Alcohol level:  Lab Results  Component Value Date   ETH <5 10/01/2015    Physical Findings: AIMS:  , ,  ,  ,    CIWA:    COWS:     Musculoskeletal: Strength & Muscle Tone: within normal limits Gait & Station: normal Patient leans: N/A  Psychiatric Specialty Exam: ROS  Blood pressure 110/77, pulse 84, height 5\' 2"  (1.575 m), weight 206 lb (93.4 kg), SpO2 94 %.Body mass index is 37.68 kg/m.  General Appearance: Casual  Eye Contact::  Good  Speech:  Clear and Coherent  Volume:  Normal  Mood:  Euthymic  Affect:  Appropriate  Thought Process:  Coherent  Orientation:  Full (Time, Place, and Person)  Thought Content:  WDL  Suicidal Thoughts:  No  Homicidal Thoughts:  No  Memory:  NA  Judgement:  Good  Insight:  Good  Psychomotor Activity:  Normal  Concentration:  Good  Recall:  Good  Fund of Knowledge:Good  Language: Good  Akathisia:  No  Handed:  Right  AIMS (if indicated):     Assets: Good spirits   ADL's:  Intact  Cognition: WNL  Sleep:     eks Treatment Plan Summary: This patient's first admission important problem is bipolar disorder. She takes multiple medications for this condition. This includes Tegretol and Abilify. At this time the patient is not in therapy. She'll make an attempt to get a therapist in her community. She's taking a moderate to high dose of Ativan 1 mg one in the morning and 2 at night and requested more of it. At this time we will not increase the dose of Ativan. We will have her response to her second problem which is her problems sleeping. She takes 3 trazodone to sleep and doesn't stay asleep. At this time she'll continue taking the trazodone but she'll begin on doxepin 50 mg in addition. Today we spent more than 50% of the time talking about her medications and educating her. She understands that she cannot drink alcohol with her medications and she'll  actually does not. The patient stays busy and active. She does importance of being in therapy and she'll actively looking for a therapist at this time. She'll return to see me in 3 months for a 30 minute visit. At that time consider getting a Tegretol blood level and other blood work.patient denies any physical complaints of chest pain shortness of breath.  Blood pressure 110/77, pulse 84, height 5\' 2"  (1.575 m), weight 206 lb (93.4 kg), SpO2 94 %.Body mass index is 37.68 kg/m.  General Appearance: Casual  Eye Contact::  Good  Speech:  Clear and Coherent  Volume:  Normal  Mood:  Euthymic  Affect:  Appropriate  Thought Process:  Coherent  Orientation:  Full (Time, Place, and Person)  Thought Content:  WDL  Suicidal Thoughts:  No  Homicidal Thoughts:  No  Memory:  NA  Judgement:  Good  Insight:  Good  Psychomotor Activity:  Normal  Concentration:  Good  Recall:  Good  Fund of Knowledge:Good  Language: Good  Akathisia:  No  Handed:  Right  AIMS (if indicated):     Assets: Good spirits   ADL's:  Intact  Cognition: WNL  Sleep:     eks Treatment Plan Summary: The patient's first problem is bipolar disorder.  This seems to be stable taking Abilify at high dose and supposedly taking Tegretol.  Will clarify if in fact she is still taking Tegretol.  The patient's second problem is that of a sleep disorder.  He will be having a sleep study hopefully in the next number of months.  For now we will continue her trazodone and slightly increase it to 400 mg and will add Lunesta 3 mg.  She will discontinue her doxepin.  Her third problem is associated anxiety.  At this time she will continue taking Ativan 1 mg 1 in the morning and 2 at night.  Hopefully in the next 6 months the patient will get into therapy.  For now she wishes to focus on her arm.  She wanted first to get better.  I think getting a sleep study would also be helpful.  Making the patient is quite stable.  She still has significant  amount of resilience despite the fact that her husband is injured with broken ribs and she just had surgery on her elbow.  This patient she will return to see me in 3 months.  On her next visit we will go ahead and get her to sign a release for her primary care doctor in [place], Dr. Signe Colt.  Noted again she shows no evidence of tardive dyskinesia.  I do not believe she is oversedated at this time.  Her fall was unrelated to being sedated.

## 2018-03-31 DIAGNOSIS — S42202D Unspecified fracture of upper end of left humerus, subsequent encounter for fracture with routine healing: Secondary | ICD-10-CM | POA: Diagnosis not present

## 2018-03-31 DIAGNOSIS — S2242XD Multiple fractures of ribs, left side, subsequent encounter for fracture with routine healing: Secondary | ICD-10-CM | POA: Diagnosis not present

## 2018-04-01 DIAGNOSIS — S2242XD Multiple fractures of ribs, left side, subsequent encounter for fracture with routine healing: Secondary | ICD-10-CM | POA: Diagnosis not present

## 2018-04-01 DIAGNOSIS — S42202D Unspecified fracture of upper end of left humerus, subsequent encounter for fracture with routine healing: Secondary | ICD-10-CM | POA: Diagnosis not present

## 2018-04-06 DIAGNOSIS — M5136 Other intervertebral disc degeneration, lumbar region: Secondary | ICD-10-CM | POA: Diagnosis not present

## 2018-04-06 DIAGNOSIS — M545 Low back pain: Secondary | ICD-10-CM | POA: Diagnosis not present

## 2018-04-06 DIAGNOSIS — Z79899 Other long term (current) drug therapy: Secondary | ICD-10-CM | POA: Diagnosis not present

## 2018-04-06 DIAGNOSIS — S32010A Wedge compression fracture of first lumbar vertebra, initial encounter for closed fracture: Secondary | ICD-10-CM | POA: Diagnosis not present

## 2018-04-06 NOTE — Progress Notes (Signed)
Subjective: CC: Hospital follow up PCP: [name], DO HCW:CBJSEG D Kabel is a 72 y.o. female presenting to clinic today for:  1. Hospital follow up for fracture Patient sustained a mechanical fall at the beginning of October onto her left side resulting in a left humeral fracture.  She also had 4 old rib fractures noted on the left side.  No surgical intervention was recommended but she was recommended to follow-up outpatient with both orthopedics and her pain management clinic.  De-escalation of pain medications were recommended.  It was also recommended that she follow-up with her PCP within 3 to 7 days of discharge.  She follows up today for interval check.   She reports that she has been doing well since discharge from the hospital.  She continues to have pain, particularly on the anterior aspect of the left shoulder.  She has associated bruising but that is healing over this area as well.  She is being followed by Dr. Alma Friendly, orthopedist at [facility] for the shoulder.  She does report that he has essentially released her care to physical therapy.  She is hoping that she will come out of the sling soon.  Next physical therapy visit is on 04/13/2018.  2.  Leg twitching Patient reports that over the last couple of days, she has had muscle twitching in her bilateral lower extremities.  This seems to be most prevalent at nighttime.  She denies any associated pain, burning or sensation that she needs to move her legs in efforts to relieve.  She is worried about possible restless leg and wanted to discuss this today.   ROS: Per HPI  Allergies  Allergen Reactions  . Ivp Dye [Iodinated Diagnostic Agents] Anaphylaxis and Swelling    Throat closes   Past Medical History:  Diagnosis Date  . Atrial fibrillation ([place])   . Bipolar affective ([place])   . CHF (congestive heart failure) ([place])   . Chronic back pain   . Diabetes mellitus without complication ([place])   . Hyperlipidemia    . Hypertension   . Pain management   . Renal insufficiency     Current Outpatient Medications:  .  apixaban (ELIQUIS) 5 MG TABS tablet, Take 1 tablet (5 mg total) by mouth 2 (two) times daily. (Needs to be seen before next refill), Disp: 180 tablet, Rfl: 0 .  ARIPiprazole (ABILIFY) 10 MG tablet, Take 3 tablets (30 mg total) by mouth daily., Disp: 90 tablet, Rfl: 3 .  atorvastatin (LIPITOR) 40 MG tablet, TAKE 1 TABLET DAILY, Disp: 90 tablet, Rfl: 1 .  Blood Glucose Monitoring Suppl (FREESTYLE LITE) DEVI, Use to check blood sugar tid. DX E11.22, Disp: 1 each, Rfl: 0 .  budesonide-formoterol (SYMBICORT) 80-4.5 MCG/ACT inhaler, Inhale 2 puffs into the lungs 2 (two) times daily., Disp: 2 Inhaler, Rfl: 0 .  Cholecalciferol (VITAMIN D3) 1000 UNITS CAPS, Take 1,000 Units by mouth daily., Disp: , Rfl:  .  diltiazem (CARDIZEM CD) 120 MG 24 hr capsule, Take 1 capsule (120 mg total) by mouth at bedtime., Disp: 90 capsule, Rfl: 3 .  Dulaglutide (TRULICITY) 1.5 BT/5.1VO SOPN, Inject 1.5 mg into the skin once a week., Disp: 12 pen, Rfl: 3 .  Eszopiclone (ESZOPICLONE) 3 MG TABS, Take 1 tablet (3 mg total) by mouth at bedtime as needed. Take immediately before bedtime, Disp: 30 tablet, Rfl: 4 .  fenofibrate (TRICOR) 145 MG tablet, Take 1 tablet (145 mg total) by mouth daily. (Needs to be seen before next refill),  Disp: 90 tablet, Rfl: 0 .  FREESTYLE LITE test strip, USE FOR TESTING SUGAR THREE TIMES DAILY, Disp: 100 each, Rfl: 3 .  furosemide (LASIX) 20 MG tablet, Take 1 tablet (20 mg total) by mouth 2 (two) times daily., Disp: 180 tablet, Rfl: 1 .  Insulin Glargine (LANTUS SOLOSTAR) 100 UNIT/ML Solostar Pen, INJECT 60 UNITS UNDER THE SKIN TWICE A DAY (Patient taking differently: Inject 60 Units into the skin 2 (two) times daily. ), Disp: 90 mL, Rfl: 0 .  Insulin Pen Needle (SURE COMFORT PEN NEEDLES) 32G X 4 MM MISC, Use with insulin pens as directed. Dx E11.9, Disp: 300 each, Rfl: 11 .  Lancets (FREESTYLE)  lancets, Test tid. DX E11.22, Disp: 100 each, Rfl: 5 .  LORazepam (ATIVAN) 1 MG tablet, Take 1 tab (1 mg) in the morning and 2 tabs (2 mg ) at bedtime, Disp: 90 tablet, Rfl: 3 .  methocarbamol (ROBAXIN) 500 MG tablet, Take 1 tablet (500 mg total) by mouth every 8 (eight) hours as needed for muscle spasms., Disp: 15 tablet, Rfl: 0 .  metoprolol tartrate (LOPRESSOR) 100 MG tablet, TAKE 1 TABLET TWICE A DAY (Patient taking differently: Take 100 mg by mouth 2 (two) times daily. Take with metoprolol 25 mg to equal 125 mg), Disp: 180 tablet, Rfl: 3 .  metoprolol tartrate (LOPRESSOR) 25 MG tablet, TAKE 1 TABLET TWICE A DAY (TO BE TAKEN ALONG WITH THE LOPRESSOR 100 MG TWICE A DAY) (Patient taking differently: Take 25 mg by mouth 2 (two) times daily. Take with metoprolol 100 mg to equal 125 mg), Disp: 180 tablet, Rfl: 4 .  naloxone (NARCAN) nasal spray 4 mg/0.1 mL, Place 1 spray into the nose once as needed (opiod overdose)., Disp: , Rfl:  .  oxyCODONE-acetaminophen (PERCOCET/ROXICET) 5-325 MG tablet, Take 1 tablet by mouth every 8 (eight) hours., Disp: , Rfl:  .  pregabalin (LYRICA) 150 MG capsule, Take 1 capsule (150 mg total) by mouth 2 (two) times daily., Disp: 180 capsule, Rfl: 2 .  rizatriptan (MAXALT-MLT) 10 MG disintegrating tablet, DISSOLVE 1 TABLET UNDER THE TONGUE IMMEDIATELY AT SIGN OF HEADACHE. MAY REPEAT DOSE IN 1 HOUR IF NEEDED. MAX OF 3 TABLETS IN 24 HOURS (Patient taking differently: Take 10 mg by mouth as needed for migraine. ), Disp: 10 tablet, Rfl: 2 .  traZODone (DESYREL) 100 MG tablet, 4  qhs, Disp: 120 tablet, Rfl: 3  Current Facility-Administered Medications:  .  eszopiclone (LUNESTA) tablet 3 mg, 3 mg, Oral, QHS PRN, Norma Fredrickson, MD Social History   Socioeconomic History  . Marital status: Married    Spouse name: Not on file  . Number of children: 3  . Years of education: 16  . Highest education level: Bachelor's degree (e.g., BA, AB, BS)  Occupational History  . Not on file   Social Needs  . Financial resource strain: Not hard at all  . Food insecurity:    Worry: Never true    Inability: Never true  . Transportation needs:    Medical: No    Non-medical: No  Tobacco Use  . Smoking status: Never Smoker  . Smokeless tobacco: Never Used  Substance and Sexual Activity  . Alcohol use: No    Alcohol/week: 0.0 standard drinks  . Drug use: No  . Sexual activity: Yes    Partners: Male    Birth control/protection: Post-menopausal  Lifestyle  . Physical activity:    Days per week: 0 days    Minutes per session: 0 min  .  Stress: Not at all  Relationships  . Social connections:    Talks on phone: More than three times a week    Gets together: Once a week    Attends religious service: More than 4 times per year    Active member of club or organization: No    Attends meetings of clubs or organizations: Never    Relationship status: Married  . Intimate partner violence:    Fear of current or ex partner: No    Emotionally abused: No    Physically abused: No    Forced sexual activity: No  Other Topics Concern  . Not on file  Social History Narrative   Lives at home with husband.    Family History  Problem Relation Age of Onset  . Diabetes Mother   . Heart disease Mother   . Heart disease Brother   . Heart disease Sister        CABG  . Alcohol abuse Sister   . Mental illness Brother   . Diabetes Brother     Objective: Office vital signs reviewed. BP 129/85   Pulse 77   Temp (!) 97.5 F (36.4 C) (Oral)   Ht _0  (1.575 m)   Wt 212 lb (96.2 kg)   BMI 38.78 kg/m   Physical Examination:  General: Awake, alert, well nourished, No acute distress MSK: TTP to anterior left shoulder.  Healing ecchymosis noted.  She is in a sling. Extremities: warm, well perfused, No edema, cyanosis or clubbing; +2 pulses bilaterally Neuro: light touch sensation in tact.  Assessment/ Plan: 72 y.o. female   1. Closed fracture of proximal end of left humerus,  unspecified fracture morphology, sequela Being managed nonoperatively.  Overall she is doing well and continues to be followed with physical therapy.  2. Hospital discharge follow-up I reviewed her hospital discharge information.  Overall, she is doing well post discharge.  She has home health in place and has been fall proofing her home.  3. Muscle twitching Nocturnal symptoms are suggestive of possible restless leg but other symptoms do not fit picture.  Will check for electrolyte disturbance.  We discussed signs and symptoms of restless leg and she will follow-up on a as needed basis. - CMP14+EGFR - Magnesium   Orders Placed This Encounter  Procedures  . CMP14+EGFR  . Magnesium   No orders of the defined types were placed in this encounter.    [name], DO [place] [phone]

## 2018-04-07 ENCOUNTER — Telehealth: Payer: Self-pay | Admitting: *Deleted

## 2018-04-07 NOTE — Telephone Encounter (Signed)
VM from [facility] w/ [facility] - pt missed today's visit she had an appt, and is busy, unable to reschedule till next week. Gave verbal ok

## 2018-04-09 ENCOUNTER — Ambulatory Visit (INDEPENDENT_AMBULATORY_CARE_PROVIDER_SITE_OTHER): Payer: Medicare Other | Admitting: Family Medicine

## 2018-04-09 ENCOUNTER — Encounter: Payer: Self-pay | Admitting: Family Medicine

## 2018-04-09 VITALS — BP 129/85 | HR 77 | Temp 97.5°F | Ht 62.0 in | Wt 212.0 lb

## 2018-04-09 DIAGNOSIS — S2242XD Multiple fractures of ribs, left side, subsequent encounter for fracture with routine healing: Secondary | ICD-10-CM | POA: Diagnosis not present

## 2018-04-09 DIAGNOSIS — Z09 Encounter for follow-up examination after completed treatment for conditions other than malignant neoplasm: Secondary | ICD-10-CM | POA: Diagnosis not present

## 2018-04-09 DIAGNOSIS — S42202S Unspecified fracture of upper end of left humerus, sequela: Secondary | ICD-10-CM | POA: Diagnosis not present

## 2018-04-09 DIAGNOSIS — S42202D Unspecified fracture of upper end of left humerus, subsequent encounter for fracture with routine healing: Secondary | ICD-10-CM | POA: Diagnosis not present

## 2018-04-09 DIAGNOSIS — R253 Fasciculation: Secondary | ICD-10-CM | POA: Diagnosis not present

## 2018-04-09 NOTE — Patient Instructions (Signed)
Restless Legs Syndrome Restless legs syndrome is a condition that causes uncomfortable feelings or sensations in the legs, especially while sitting or lying down. The sensations usually cause an overwhelming urge to move the legs. The arms can also sometimes be affected. The condition can range from mild to severe. The symptoms often interfere with a person's ability to sleep. What are the causes? The cause of this condition is not known. What increases the risk? This condition is more likely to develop in:  People who are older than age 50.  Pregnant women. In general, restless legs syndrome is more common in women than in men.  People who have a family history of the condition.  People who have certain medical conditions, such as iron deficiency, kidney disease, Parkinson disease, or nerve damage.  People who take certain medicines, such as medicines for high blood pressure, nausea, colds, allergies, depression, and some heart conditions.  What are the signs or symptoms? The main symptom of this condition is uncomfortable sensations in the legs. These sensations may be:  Described as pulling, tingling, prickling, throbbing, crawling, or burning.  Worse while you are sitting or lying down.  Worse during periods of rest or inactivity.  Worse at night, often interfering with your sleep.  Accompanied by a very strong urge to move your legs.  Temporarily relieved by movement of your legs.  The sensations usually affect both sides of the body. The arms can also be affected, but this is rare. People who have this condition often have tiredness during the day because of their lack of sleep at night. How is this diagnosed? This condition may be diagnosed based on your description of the symptoms. You may also have tests, including blood tests, to check for other conditions that may lead to your symptoms. In some cases, you may be asked to spend some time in a sleep lab so your sleeping  can be monitored. How is this treated? Treatment for this condition is focused on managing the symptoms. Treatment may include:  Self-help and lifestyle changes.  Medicines.  Follow these instructions at home:  Take medicines only as directed by your health care provider.  Try these methods to get temporary relief from the uncomfortable sensations: ? Massage your legs. ? Walk or stretch. ? Take a cold or hot bath.  Practice good sleep habits. For example, go to bed and get up at the same time every day.  Exercise regularly.  Practice ways of relaxing, such as yoga or meditation.  Avoid caffeine and alcohol.  Do not use any tobacco products, including cigarettes, chewing tobacco, or electronic cigarettes. If you need help quitting, ask your health care provider.  Keep all follow-up visits as directed by your health care provider. This is important. Contact a health care provider if: Your symptoms do not improve with treatment, or they get worse. This information is not intended to replace advice given to you by your health care provider. Make sure you discuss any questions you have with your health care provider. Document Released: 05/09/2002 Document Revised: 10/25/2015 Document Reviewed: 05/15/2014 Elsevier Interactive Patient Education  2018 Elsevier Inc.  

## 2018-04-10 LAB — CMP14+EGFR
ALT: 12 IU/L (ref 0–32)
AST: 16 IU/L (ref 0–40)
Albumin/Globulin Ratio: 2.1 (ref 1.2–2.2)
Albumin: 4.1 g/dL (ref 3.5–4.8)
Alkaline Phosphatase: 93 IU/L (ref 39–117)
BUN/Creatinine Ratio: 15 (ref 12–28)
BUN: 15 mg/dL (ref 8–27)
Bilirubin Total: 0.3 mg/dL (ref 0.0–1.2)
CO2: 28 mmol/L (ref 20–29)
Calcium: 9.3 mg/dL (ref 8.7–10.3)
Chloride: 102 mmol/L (ref 96–106)
Creatinine, Ser: 0.98 mg/dL (ref 0.57–1.00)
GFR calc Af Amer: 68 mL/min/{1.73_m2} (ref >59)
GFR calc non Af Amer: 59 mL/min/{1.73_m2} — ABNORMAL LOW (ref >59)
Globulin, Total: 2 g/dL (ref 1.5–4.5)
Glucose: 220 mg/dL — ABNORMAL HIGH (ref 65–99)
Potassium: 4.2 mmol/L (ref 3.5–5.2)
Sodium: 143 mmol/L (ref 134–144)
Total Protein: 6.1 g/dL (ref 6.0–8.5)

## 2018-04-10 LAB — MAGNESIUM: Magnesium: 1.8 mg/dL (ref 1.6–2.3)

## 2018-04-12 DIAGNOSIS — S42202D Unspecified fracture of upper end of left humerus, subsequent encounter for fracture with routine healing: Secondary | ICD-10-CM | POA: Diagnosis not present

## 2018-04-12 DIAGNOSIS — S2242XD Multiple fractures of ribs, left side, subsequent encounter for fracture with routine healing: Secondary | ICD-10-CM | POA: Diagnosis not present

## 2018-04-12 DIAGNOSIS — S42295D Other nondisplaced fracture of upper end of left humerus, subsequent encounter for fracture with routine healing: Secondary | ICD-10-CM | POA: Diagnosis not present

## 2018-04-13 ENCOUNTER — Telehealth: Payer: Self-pay | Admitting: *Deleted

## 2018-04-13 DIAGNOSIS — M5136 Other intervertebral disc degeneration, lumbar region: Secondary | ICD-10-CM | POA: Diagnosis not present

## 2018-04-13 DIAGNOSIS — I482 Chronic atrial fibrillation, unspecified: Secondary | ICD-10-CM | POA: Diagnosis not present

## 2018-04-13 DIAGNOSIS — S32010A Wedge compression fracture of first lumbar vertebra, initial encounter for closed fracture: Secondary | ICD-10-CM | POA: Diagnosis not present

## 2018-04-13 DIAGNOSIS — Z79899 Other long term (current) drug therapy: Secondary | ICD-10-CM | POA: Diagnosis not present

## 2018-04-13 DIAGNOSIS — M545 Low back pain: Secondary | ICD-10-CM | POA: Diagnosis not present

## 2018-04-13 NOTE — Telephone Encounter (Signed)
Vm from [facility] w/ [name] FYI pt fell today - tripped over bathmat in bathroom She called Orthopedist, going to have xrays about 3pm Don recommended to patient to only use bathmat during showering

## 2018-04-20 DIAGNOSIS — M545 Low back pain: Secondary | ICD-10-CM | POA: Diagnosis not present

## 2018-04-22 ENCOUNTER — Ambulatory Visit (HOSPITAL_COMMUNITY): Payer: Self-pay | Admitting: Psychiatry

## 2018-04-30 ENCOUNTER — Ambulatory Visit: Payer: Medicare Other | Admitting: Nurse Practitioner

## 2018-05-03 ENCOUNTER — Encounter: Payer: Self-pay | Admitting: Family Medicine

## 2018-05-03 ENCOUNTER — Ambulatory Visit (INDEPENDENT_AMBULATORY_CARE_PROVIDER_SITE_OTHER): Payer: Medicare Other | Admitting: Family Medicine

## 2018-05-03 VITALS — BP 95/60 | HR 65 | Temp 97.8°F | Ht 62.0 in | Wt 209.0 lb

## 2018-05-03 DIAGNOSIS — M7022 Olecranon bursitis, left elbow: Secondary | ICD-10-CM

## 2018-05-03 MED ORDER — CEPHALEXIN 500 MG PO CAPS: 500.0000 mg | ORAL_CAPSULE | Freq: Four times a day (QID) | ORAL | 0 refills | Status: AC

## 2018-05-03 NOTE — Progress Notes (Signed)
Subjective: CC: growth on elbow PCP: Amber Norlander, DO Amber Stephenson is a 72 y.o. female presenting to clinic today for:  1. Elbow mass Patient reports that she started developing a growth on her left elbow after she sustained a fall.  She notes tenderness over the elbow.  No fevers, drainage.  She has not been doing anything to improve symptoms.  Symptoms are worsened with flexion of the elbow.  No sensation changes.   ROS: Per HPI  Allergies  Allergen Reactions  . Ivp Dye [Iodinated Diagnostic Agents] Anaphylaxis and Swelling    Throat closes   Past Medical History:  Diagnosis Date  . Atrial fibrillation ([name])   . Bipolar affective ([place])   . CHF (congestive heart failure) ([place])   . Chronic back pain   . Diabetes mellitus without complication ([place])   . Hyperlipidemia   . Hypertension   . Pain management   . Renal insufficiency     Current Outpatient Medications:  .  apixaban (ELIQUIS) 5 MG TABS tablet, Take 1 tablet (5 mg total) by mouth 2 (two) times daily. (Needs to be seen before next refill), Disp: 180 tablet, Rfl: 0 .  ARIPiprazole (ABILIFY) 10 MG tablet, Take 3 tablets (30 mg total) by mouth daily., Disp: 90 tablet, Rfl: 3 .  atorvastatin (LIPITOR) 40 MG tablet, TAKE 1 TABLET DAILY, Disp: 90 tablet, Rfl: 1 .  Blood Glucose Monitoring Suppl (FREESTYLE LITE) DEVI, Use to check blood sugar tid. DX E11.22, Disp: 1 each, Rfl: 0 .  budesonide-formoterol (SYMBICORT) 80-4.5 MCG/ACT inhaler, Inhale 2 puffs into the lungs 2 (two) times daily., Disp: 2 Inhaler, Rfl: 0 .  Cholecalciferol (VITAMIN D3) 1000 UNITS CAPS, Take 1,000 Units by mouth daily., Disp: , Rfl:  .  diltiazem (CARDIZEM CD) 120 MG 24 hr capsule, Take 1 capsule (120 mg total) by mouth at bedtime., Disp: 90 capsule, Rfl: 3 .  Dulaglutide (TRULICITY) 1.5 JA/2.5KN SOPN, Inject 1.5 mg into the skin once a week., Disp: 12 pen, Rfl: 3 .  Eszopiclone (ESZOPICLONE) 3 MG TABS, Take 1 tablet (3 mg total) by  mouth at bedtime as needed. Take immediately before bedtime, Disp: 30 tablet, Rfl: 4 .  fenofibrate (TRICOR) 145 MG tablet, Take 1 tablet (145 mg total) by mouth daily. (Needs to be seen before next refill), Disp: 90 tablet, Rfl: 0 .  FREESTYLE LITE test strip, USE FOR TESTING SUGAR THREE TIMES DAILY, Disp: 100 each, Rfl: 3 .  furosemide (LASIX) 20 MG tablet, Take 1 tablet (20 mg total) by mouth 2 (two) times daily., Disp: 180 tablet, Rfl: 1 .  Insulin Glargine (LANTUS SOLOSTAR) 100 UNIT/ML Solostar Pen, INJECT 60 UNITS UNDER THE SKIN TWICE A DAY (Patient taking differently: Inject 60 Units into the skin 2 (two) times daily. ), Disp: 90 mL, Rfl: 0 .  Insulin Pen Needle (SURE COMFORT PEN NEEDLES) 32G X 4 MM MISC, Use with insulin pens as directed. Dx E11.9, Disp: 300 each, Rfl: 11 .  Lancets (FREESTYLE) lancets, Test tid. DX E11.22, Disp: 100 each, Rfl: 5 .  LORazepam (ATIVAN) 1 MG tablet, Take 1 tab (1 mg) in the morning and 2 tabs (2 mg ) at bedtime, Disp: 90 tablet, Rfl: 3 .  methocarbamol (ROBAXIN) 500 MG tablet, Take 1 tablet (500 mg total) by mouth every 8 (eight) hours as needed for muscle spasms., Disp: 15 tablet, Rfl: 0 .  metoprolol tartrate (LOPRESSOR) 100 MG tablet, TAKE 1 TABLET TWICE A DAY (Patient taking differently:  Take 100 mg by mouth 2 (two) times daily. Take with metoprolol 25 mg to equal 125 mg), Disp: 180 tablet, Rfl: 3 .  metoprolol tartrate (LOPRESSOR) 25 MG tablet, TAKE 1 TABLET TWICE A DAY (TO BE TAKEN ALONG WITH THE LOPRESSOR 100 MG TWICE A DAY) (Patient taking differently: Take 25 mg by mouth 2 (two) times daily. Take with metoprolol 100 mg to equal 125 mg), Disp: 180 tablet, Rfl: 4 .  naloxone (NARCAN) nasal spray 4 mg/0.1 mL, Place 1 spray into the nose once as needed (opiod overdose)., Disp: , Rfl:  .  oxyCODONE-acetaminophen (PERCOCET/ROXICET) 5-325 MG tablet, Take 1 tablet by mouth every 8 (eight) hours., Disp: , Rfl:  .  pregabalin (LYRICA) 150 MG capsule, Take 1 capsule  (150 mg total) by mouth 2 (two) times daily., Disp: 180 capsule, Rfl: 2 .  rizatriptan (MAXALT-MLT) 10 MG disintegrating tablet, DISSOLVE 1 TABLET UNDER THE TONGUE IMMEDIATELY AT SIGN OF HEADACHE. MAY REPEAT DOSE IN 1 HOUR IF NEEDED. MAX OF 3 TABLETS IN 24 HOURS (Patient taking differently: Take 10 mg by mouth as needed for migraine. ), Disp: 10 tablet, Rfl: 2 .  traZODone (DESYREL) 100 MG tablet, 4  qhs, Disp: 120 tablet, Rfl: 3  Current Facility-Administered Medications:  .  eszopiclone (LUNESTA) tablet 3 mg, 3 mg, Oral, QHS PRN, Amber Fredrickson, MD Social History   Socioeconomic History  . Marital status: Married    Spouse name: Not on file  . Number of children: 3  . Years of education: 16  . Highest education level: Bachelor's degree (e.g., BA, AB, BS)  Occupational History  . Not on file  Social Needs  . Financial resource strain: Not hard at all  . Food insecurity:    Worry: Never true    Inability: Never true  . Transportation needs:    Medical: No    Non-medical: No  Tobacco Use  . Smoking status: Never Smoker  . Smokeless tobacco: Never Used  Substance and Sexual Activity  . Alcohol use: No    Alcohol/week: 0.0 standard drinks  . Drug use: No  . Sexual activity: Yes    Partners: Male    Birth control/protection: Post-menopausal  Lifestyle  . Physical activity:    Days per week: 0 days    Minutes per session: 0 min  . Stress: Not at all  Relationships  . Social connections:    Talks on phone: More than three times a week    Gets together: Once a week    Attends religious service: More than 4 times per year    Active member of club or organization: No    Attends meetings of clubs or organizations: Never    Relationship status: Married  . Intimate partner violence:    Fear of current or ex partner: No    Emotionally abused: No    Physically abused: No    Forced sexual activity: No  Other Topics Concern  . Not on file  Social History Narrative   Lives at  home with husband.    Family History  Problem Relation Age of Onset  . Diabetes Mother   . Heart disease Mother   . Heart disease Brother   . Heart disease Sister        CABG  . Alcohol abuse Sister   . Mental illness Brother   . Diabetes Brother     Objective: Office vital signs reviewed. BP 95/60   Pulse 65   Temp 97.8 F (  36.6 C) (Oral)   Ht 5\' 2"  (1.575 m)   Wt 209 lb (94.8 kg)   BMI 38.23 kg/m   Physical Examination:  General: Awake, alert, nontoxic, No acute distress Extremities: warm, well perfused, No edema, cyanosis or clubbing; +2 pulses bilaterally MSK:  Left elbow: Patient has full active range of motion but does have pain with flexion of the elbow.  There is swelling appreciated over the olecranon consistent with olecranon bursitis.  Is approximately golf ball sized.  She has associated increased warmth and erythema over the olecranon bursa.  No extension into the elbow.  No tenderness to palpation to the elbow joint itself. Skin: as above Neuro: light touch sensation in tact  Assessment/ Plan: 72 y.o. female   1. Olecranon bursitis, left elbow Physical exam consistent with olecranon bursitis.  We discussed compression, ice.  She is currently afebrile and demonstrates no evidence of systemic infections or joint infection at this time.  I have empirically placed her on Keflex as well given increased warmth and erythema.  Home care instructions reviewed.  Patient will contact me if symptoms are not improving or if they worsen.  We can plan to refer to orthopedist to have this drained if needed.  Signs and symptoms of joint infection discussed with the patient.  She understands that joint infections are an emergency and would need to be evaluated immediately.  Prior to discharge, patient was wrapped in Ace bandage.  Encouraged ongoing compression to reduce size of bursitis.   No orders of the defined types were placed in this encounter.  Meds ordered this encounter   Medications  . cephALEXin (KEFLEX) 500 MG capsule    Sig: Take 1 capsule (500 mg total) by mouth 4 (four) times daily for 7 days.    Dispense:  28 capsule    Refill:  [facility], DO [place] [phone]

## 2018-05-03 NOTE — Patient Instructions (Addendum)
Because it has some redness and warmth to it, I am starting you on an antibiotic called cephalexin to take 4 times daily for the next 7 days.  We discussed that if the redness or pain extends into the elbow joint this is considered worrisome and should be immediately evaluated.  Follow the directions as below with ice, compression and avoidance of hitting the elbow to improve symptoms.  If the swelling becomes worse or does not resolve, contact me and I will place a referral to the orthopedist to have it drained.  Elbow Bursitis Elbow bursitis is inflammation of the fluid-filled sac (bursa) between the tip of your elbow bone (olecranon) and your skin. Elbow bursitis may also be called olecranon bursitis. Normally, the olecranon bursa has only a small amount of fluid in it to cushion and protect your elbow bone. Elbow bursitis causes fluid to build up inside the bursa. Over time, this swelling and inflammation can cause pain when you bend or lean on your elbow. What are the causes? Elbow bursitis may be caused by:  Elbow injury (acute trauma).  Leaning on hard surfaces for long periods of time.  Infection from an injury that breaks the skin near your elbow.  A bone growth (spur) that forms at the tip of your elbow.  A medical condition that causes inflammation in your body, such as gout or rheumatoid arthritis.  The cause may also be unknown. What are the signs or symptoms? The first sign of elbow bursitis is usually swelling over the tip of your elbow. This can grow to be the size of a golf ball. This may start suddenly or develop gradually. You may also have:  Pain when bending or leaning on your elbow.  Restricted movement of your elbow.  If your bursitis is caused by an infection, symptoms may also include:  Redness, warmth, and tenderness of the elbow.  Drainage of pus from the swollen area over your elbow, if the skin breaks open.  How is this diagnosed? Your health care  provider may be able to diagnose elbow bursitis based on your signs and symptoms, especially if you have recently been injured. Your health care provider will also do a physical exam. This may include:  X-rays to look for a bone spur or a bone fracture.  Draining fluid from the bursa to test it for infection.  Blood tests to rule out gout or rheumatoid arthritis.  How is this treated? Treatment for elbow bursitis depends on the cause. Treatment may include:  Medicines. These may include: ? Over-the-counter medicines to relieve pain and inflammation. ? Antibiotic medicines to fight infection. ? Injections of anti-inflammatory medicines (steroids).  Wrapping your elbow with a bandage.  Draining fluid from the bursa.  Wearing elbow pads.  If your bursitis does not get better with treatment, surgery may be needed to remove the bursa. Follow these instructions at home:  Take medicines only as directed by your health care provider.  If you were prescribed an antibiotic medicine, finish all of it even if you start to feel better.  If your bursitis is caused by an injury, rest your elbow and wear your bandage as directed by your health care provider. You may alsoapply ice to the injured area as directed by your health care provider: ? Put ice in a plastic bag. ? Place a towel between your skin and the bag. ? Leave the ice on for 20 minutes, 2-3 times per day.  Avoid any activities that cause  elbow pain.  Use elbow pads or elbow wraps to cushion your elbow. Contact a health care provider if:  You have a fever.  Your symptoms do not get better with treatment.  Your pain or swelling gets worse.  Your elbow pain or swelling goes away and then returns.  You have drainage of pus from the swollen area over your elbow. This information is not intended to replace advice given to you by your health care provider. Make sure you discuss any questions you have with your health care  provider. Document Released: 06/18/2006 Document Revised: 10/25/2015 Document Reviewed: 01/25/2014 Elsevier Interactive Patient Education  [employer].

## 2018-05-05 ENCOUNTER — Telehealth: Payer: Self-pay | Admitting: Family Medicine

## 2018-05-05 NOTE — Telephone Encounter (Signed)
Dr. Lajuana Ripple put her on this in case infection started.  Please asked the patient if she has any more redness or if there is any drainage from the bursitis.  And if there is I will start another antibiotic but if not I would have her to stop the Keflex obviously, and watch it and call if any infection starts.  I will be here the next 2 days.

## 2018-05-05 NOTE — Telephone Encounter (Signed)
Spoke with pt and she states it isn't red and denies drainage so advised to stop taking Keflex and to call us back if she notices any redness or drainage. Pt voiced understanding.

## 2018-05-05 NOTE — Telephone Encounter (Signed)
Pt was started on Keflex, should she stop taking and start a different antibiotic?

## 2018-05-06 ENCOUNTER — Ambulatory Visit: Payer: Self-pay | Admitting: *Deleted

## 2018-05-06 DIAGNOSIS — I4821 Permanent atrial fibrillation: Secondary | ICD-10-CM

## 2018-05-06 DIAGNOSIS — M810 Age-related osteoporosis without current pathological fracture: Secondary | ICD-10-CM

## 2018-05-06 DIAGNOSIS — I5032 Chronic diastolic (congestive) heart failure: Secondary | ICD-10-CM

## 2018-05-06 DIAGNOSIS — Z794 Long term (current) use of insulin: Principal | ICD-10-CM

## 2018-05-06 DIAGNOSIS — S42292A Other displaced fracture of upper end of left humerus, initial encounter for closed fracture: Secondary | ICD-10-CM

## 2018-05-06 DIAGNOSIS — F3112 Bipolar disorder, current episode manic without psychotic features, moderate: Secondary | ICD-10-CM

## 2018-05-06 DIAGNOSIS — E1169 Type 2 diabetes mellitus with other specified complication: Secondary | ICD-10-CM

## 2018-05-06 DIAGNOSIS — I10 Essential (primary) hypertension: Secondary | ICD-10-CM

## 2018-05-06 DIAGNOSIS — J4520 Mild intermittent asthma, uncomplicated: Secondary | ICD-10-CM

## 2018-05-10 DIAGNOSIS — M419 Scoliosis, unspecified: Secondary | ICD-10-CM | POA: Diagnosis not present

## 2018-05-10 DIAGNOSIS — Z79899 Other long term (current) drug therapy: Secondary | ICD-10-CM | POA: Diagnosis not present

## 2018-05-10 DIAGNOSIS — M5136 Other intervertebral disc degeneration, lumbar region: Secondary | ICD-10-CM | POA: Diagnosis not present

## 2018-05-10 DIAGNOSIS — M545 Low back pain: Secondary | ICD-10-CM | POA: Diagnosis not present

## 2018-05-11 DIAGNOSIS — E1165 Type 2 diabetes mellitus with hyperglycemia: Secondary | ICD-10-CM | POA: Diagnosis not present

## 2018-05-11 DIAGNOSIS — N183 Chronic kidney disease, stage 3 (moderate): Secondary | ICD-10-CM | POA: Diagnosis not present

## 2018-05-11 DIAGNOSIS — Z794 Long term (current) use of insulin: Secondary | ICD-10-CM | POA: Diagnosis not present

## 2018-05-11 DIAGNOSIS — E1140 Type 2 diabetes mellitus with diabetic neuropathy, unspecified: Secondary | ICD-10-CM | POA: Diagnosis not present

## 2018-05-11 DIAGNOSIS — E1122 Type 2 diabetes mellitus with diabetic chronic kidney disease: Secondary | ICD-10-CM | POA: Diagnosis not present

## 2018-05-12 DIAGNOSIS — S42202D Unspecified fracture of upper end of left humerus, subsequent encounter for fracture with routine healing: Secondary | ICD-10-CM | POA: Diagnosis not present

## 2018-05-12 NOTE — Chronic Care Management (AMB) (Signed)
  Chronic Care Management   Note  05/12/2018 Name: CHANY WOOLWORTH MRN: [id number] DOB: 03-Oct-1945  Alton Tremblay is a 72 year old female primary care patient of Dr Lajuana Ripple who was referred by her health plan for CCM due to multiple chronic medical conditions that include: HTN, Afib, heart failure, asthma, diabetes, bipolar disorder, and history of osteoporotic fracture. Initial outreach was unsuccessful. I left a message for Ms Mcchesney to return my call.   Plan If Ms Bobb does not return my call I will reach out by phone within the next 7 days.   Chong Sicilian, RN-BC, BSN Nurse Case Manager [facility] Ph: [phone]

## 2018-05-13 ENCOUNTER — Ambulatory Visit (INDEPENDENT_AMBULATORY_CARE_PROVIDER_SITE_OTHER): Payer: Medicare Other | Admitting: Family Medicine

## 2018-05-13 ENCOUNTER — Encounter: Payer: Self-pay | Admitting: Family Medicine

## 2018-05-13 VITALS — BP 120/77 | HR 82 | Temp 96.9°F | Ht 62.0 in | Wt 206.0 lb

## 2018-05-13 DIAGNOSIS — B372 Candidiasis of skin and nail: Secondary | ICD-10-CM

## 2018-05-13 DIAGNOSIS — S42202D Unspecified fracture of upper end of left humerus, subsequent encounter for fracture with routine healing: Secondary | ICD-10-CM | POA: Diagnosis not present

## 2018-05-13 MED ORDER — KETOCONAZOLE 2 % EX CREA: TOPICAL_CREAM | CUTANEOUS | 1 refills | Status: DC

## 2018-05-13 NOTE — Patient Instructions (Signed)
STOP clotrimazole cream.  START Ketoconazole cream.  Apply ONE time daily to affected areas for 3-4 weeks.  Keep area as dry as possible.  Moisture increases yeast growth.   Intertrigo How is this diagnosed? This condition is diagnosed with a medical history and physical exam. You may also have a skin swab to test for bacteria or a fungus, such as yeast. How is this treated? Treatment may include:  Cleaning and drying your skin.  An oral antibiotic medicine or antibiotic skin cream for a bacterial infection.  Antifungal cream or pills for an infection that was caused by a fungus, such as yeast.  Steroid ointment to relieve itchiness and irritation.  Follow these instructions at home:  Keep the affected area clean and dry.  Do not scratch your skin.  Stay in a cool environment as much as possible. Use an air conditioner or fan, if available.  Apply over-the-counter and prescription medicines only as told by your health care provider.  If you were prescribed an antibiotic medicine, use it as told by your health care provider. Do not stop using the antibiotic even if your condition improves.  Keep all follow-up visits as told by your health care provider. This is important. How is this prevented?  Maintain a healthy weight.  Take care of your feet, especially if you have diabetes. Foot care includes: ? Wearing shoes that fit well. ? Keeping your feet dry. ? Wearing clean, breathable socks.  Protect the skin around your groin and buttocks, especially if you have incontinence. Skin protection includes: ? Following a regular cleaning routine. ? Using moisturizers and skin protectants. ? Changing protection pads frequently.  Do not wear tight clothes. Wear clothes that are loose and absorbent. Wear clothes that are made of cotton.  Wear a bra that gives good support, if needed.  Shower and dry yourself thoroughly after activity. Use a hair dryer on a cool setting to dry  between skin folds, especially after you bathe.  If you have diabetes, keep your blood sugar under control. Contact a health care provider if:  Your symptoms do not improve with treatment.  Your symptoms get worse or they spread.  You notice increased redness and warmth.  You have a fever. This information is not intended to replace advice given to you by your health care provider. Make sure you discuss any questions you have with your health care provider. Document Released: 05/19/2005 Document Revised: 10/25/2015 Document Reviewed: 11/20/2014 Elsevier Interactive Patient Education  2018 [employer].

## 2018-05-13 NOTE — Progress Notes (Signed)
Subjective: CC: rash on privates PCP: [name], DO DUK:GURKYH Amber Stephenson is a 72 y.o. female presenting to clinic today for:  1. Genital rash Patient reports a 3-day history of a rash just above her vagina and her left inguinal thigh fold.  She reports it as itching and burning.  No exudate.  She has been applying clotrimazole which she had leftover from a previous similar rash.  She has not found that this is been helpful.   ROS: Per HPI  Allergies  Allergen Reactions  . Ivp Dye [Iodinated Diagnostic Agents] Anaphylaxis and Swelling    Throat closes   Past Medical History:  Diagnosis Date  . Atrial fibrillation ([place])   . Bipolar affective ([place])   . CHF (congestive heart failure) ([place])   . Chronic back pain   . Diabetes mellitus without complication ([place])   . Hyperlipidemia   . Hypertension   . Pain management   . Renal insufficiency     Current Outpatient Medications:  .  apixaban (ELIQUIS) 5 MG TABS tablet, Take 1 tablet (5 mg total) by mouth 2 (two) times daily. (Needs to be seen before next refill), Disp: 180 tablet, Rfl: 0 .  ARIPiprazole (ABILIFY) 10 MG tablet, Take 3 tablets (30 mg total) by mouth daily., Disp: 90 tablet, Rfl: 3 .  atorvastatin (LIPITOR) 40 MG tablet, TAKE 1 TABLET DAILY, Disp: 90 tablet, Rfl: 1 .  Blood Glucose Monitoring Suppl (FREESTYLE LITE) DEVI, Use to check blood sugar tid. DX E11.22, Disp: 1 each, Rfl: 0 .  budesonide-formoterol (SYMBICORT) 80-4.5 MCG/ACT inhaler, Inhale 2 puffs into the lungs 2 (two) times daily., Disp: 2 Inhaler, Rfl: 0 .  Cholecalciferol (VITAMIN D3) 1000 UNITS CAPS, Take 1,000 Units by mouth daily., Disp: , Rfl:  .  diltiazem (CARDIZEM CD) 120 MG 24 hr capsule, Take 1 capsule (120 mg total) by mouth at bedtime., Disp: 90 capsule, Rfl: 3 .  Dulaglutide (TRULICITY) 1.5 CW/2.3JS SOPN, Inject 1.5 mg into the skin once a week., Disp: 12 pen, Rfl: 3 .  Eszopiclone (ESZOPICLONE) 3 MG TABS, Take 1 tablet (3 mg total) by  mouth at bedtime as needed. Take immediately before bedtime, Disp: 30 tablet, Rfl: 4 .  fenofibrate (TRICOR) 145 MG tablet, Take 1 tablet (145 mg total) by mouth daily. (Needs to be seen before next refill), Disp: 90 tablet, Rfl: 0 .  FREESTYLE LITE test strip, USE FOR TESTING SUGAR THREE TIMES DAILY, Disp: 100 each, Rfl: 3 .  furosemide (LASIX) 20 MG tablet, Take 1 tablet (20 mg total) by mouth 2 (two) times daily., Disp: 180 tablet, Rfl: 1 .  Insulin Glargine (LANTUS SOLOSTAR) 100 UNIT/ML Solostar Pen, INJECT 60 UNITS UNDER THE SKIN TWICE A DAY (Patient taking differently: Inject 60 Units into the skin 2 (two) times daily. ), Disp: 90 mL, Rfl: 0 .  Insulin Pen Needle (SURE COMFORT PEN NEEDLES) 32G X 4 MM MISC, Use with insulin pens as directed. Dx E11.9, Disp: 300 each, Rfl: 11 .  Lancets (FREESTYLE) lancets, Test tid. DX E11.22, Disp: 100 each, Rfl: 5 .  LORazepam (ATIVAN) 1 MG tablet, Take 1 tab (1 mg) in the morning and 2 tabs (2 mg ) at bedtime, Disp: 90 tablet, Rfl: 3 .  methocarbamol (ROBAXIN) 500 MG tablet, Take 1 tablet (500 mg total) by mouth every 8 (eight) hours as needed for muscle spasms., Disp: 15 tablet, Rfl: 0 .  metoprolol tartrate (LOPRESSOR) 100 MG tablet, TAKE 1 TABLET TWICE A DAY (  Patient taking differently: Take 100 mg by mouth 2 (two) times daily. Take with metoprolol 25 mg to equal 125 mg), Disp: 180 tablet, Rfl: 3 .  metoprolol tartrate (LOPRESSOR) 25 MG tablet, TAKE 1 TABLET TWICE A DAY (TO BE TAKEN ALONG WITH THE LOPRESSOR 100 MG TWICE A DAY) (Patient taking differently: Take 25 mg by mouth 2 (two) times daily. Take with metoprolol 100 mg to equal 125 mg), Disp: 180 tablet, Rfl: 4 .  naloxone (NARCAN) nasal spray 4 mg/0.1 mL, Place 1 spray into the nose once as needed (opiod overdose)., Disp: , Rfl:  .  oxyCODONE-acetaminophen (PERCOCET/ROXICET) 5-325 MG tablet, Take 1 tablet by mouth every 8 (eight) hours., Disp: , Rfl:  .  pregabalin (LYRICA) 150 MG capsule, Take 1 capsule  (150 mg total) by mouth 2 (two) times daily., Disp: 180 capsule, Rfl: 2 .  rizatriptan (MAXALT-MLT) 10 MG disintegrating tablet, DISSOLVE 1 TABLET UNDER THE TONGUE IMMEDIATELY AT SIGN OF HEADACHE. MAY REPEAT DOSE IN 1 HOUR IF NEEDED. MAX OF 3 TABLETS IN 24 HOURS (Patient taking differently: Take 10 mg by mouth as needed for migraine. ), Disp: 10 tablet, Rfl: 2 .  traZODone (DESYREL) 100 MG tablet, 4  qhs, Disp: 120 tablet, Rfl: 3  Current Facility-Administered Medications:  .  eszopiclone (LUNESTA) tablet 3 mg, 3 mg, Oral, QHS PRN, Amber Fredrickson, MD Social History   Socioeconomic History  . Marital status: Married    Spouse name: Not on file  . Number of children: 3  . Years of education: 16  . Highest education level: Bachelor's degree (e.g., BA, AB, BS)  Occupational History  . Not on file  Social Needs  . Financial resource strain: Not hard at all  . Food insecurity:    Worry: Never true    Inability: Never true  . Transportation needs:    Medical: No    Non-medical: No  Tobacco Use  . Smoking status: Never Smoker  . Smokeless tobacco: Never Used  Substance and Sexual Activity  . Alcohol use: No    Alcohol/week: 0.0 standard drinks  . Drug use: No  . Sexual activity: Yes    Partners: Male    Birth control/protection: Post-menopausal  Lifestyle  . Physical activity:    Days per week: 0 days    Minutes per session: 0 min  . Stress: Not at all  Relationships  . Social connections:    Talks on phone: More than three times a week    Gets together: Once a week    Attends religious service: More than 4 times per year    Active member of club or organization: No    Attends meetings of clubs or organizations: Never    Relationship status: Married  . Intimate partner violence:    Fear of current or ex partner: No    Emotionally abused: No    Physically abused: No    Forced sexual activity: No  Other Topics Concern  . Not on file  Social History Narrative   Lives at  home with husband.    Family History  Problem Relation Age of Onset  . Diabetes Mother   . Heart disease Mother   . Heart disease Brother   . Heart disease Sister        CABG  . Alcohol abuse Sister   . Mental illness Brother   . Diabetes Brother     Objective: Office vital signs reviewed. BP 120/77   Pulse 82  Temp (!) 96.9 F (36.1 C) (Oral)   Ht 5\' 2"  (1.575 m)   Wt 206 lb (93.4 kg)   BMI 37.68 kg/m   Physical Examination:  General: Awake, alert, obese, No acute distress Skin: Macerated, erythematous rash that is shiny appreciated in the inguinal fold and just under the pannus on the left side.  No secondary bacterial infection appreciated.  There is an associated odor.  Assessment/ Plan: 72 y.o. female   1. Candidal intertrigo Physical consistent with candidal intertrigo.  No evidence of secondary bacterial infection at this time.  Start ketoconazole cream daily for the next 3 to 4 weeks.  We discussed keeping area dry.  Reasons for return discussed.  She will follow-up PRN.   No orders of the defined types were placed in this encounter.  Meds ordered this encounter  Medications  . ketoconazole (NIZORAL) 2 % cream    Sig: Apply to affected areas in groin region ONE time daily for 3-4 weeks.    Dispense:  60 g    Refill:  Ward, DO [place] [phone]  `

## 2018-05-18 DIAGNOSIS — M79672 Pain in left foot: Secondary | ICD-10-CM | POA: Diagnosis not present

## 2018-05-18 DIAGNOSIS — M722 Plantar fascial fibromatosis: Secondary | ICD-10-CM | POA: Diagnosis not present

## 2018-05-19 DIAGNOSIS — M25512 Pain in left shoulder: Secondary | ICD-10-CM | POA: Diagnosis not present

## 2018-05-20 ENCOUNTER — Other Ambulatory Visit: Payer: Self-pay | Admitting: Nurse Practitioner

## 2018-05-21 ENCOUNTER — Ambulatory Visit: Payer: Self-pay | Admitting: Pulmonary Disease

## 2018-05-21 DIAGNOSIS — M25512 Pain in left shoulder: Secondary | ICD-10-CM | POA: Diagnosis not present

## 2018-05-24 ENCOUNTER — Telehealth: Payer: Self-pay | Admitting: Cardiovascular Disease

## 2018-05-24 ENCOUNTER — Other Ambulatory Visit: Payer: Self-pay | Admitting: Cardiovascular Disease

## 2018-05-24 MED ORDER — METOPROLOL TARTRATE 25 MG PO TABS: 25.0000 mg | ORAL_TABLET | Freq: Two times a day (BID) | ORAL | 0 refills | Status: DC

## 2018-05-24 MED ORDER — METOPROLOL TARTRATE 100 MG PO TABS: 100.0000 mg | ORAL_TABLET | Freq: Two times a day (BID) | ORAL | 0 refills | Status: DC

## 2018-05-24 NOTE — Telephone Encounter (Signed)
Needs 2 wk supply of Metoprolol sent to [facility] until she gets her mail order rx/ tg

## 2018-05-24 NOTE — Telephone Encounter (Signed)
14 day  Metoprolol 100 mg and 25 mg escribed to [facility]

## 2018-05-31 ENCOUNTER — Other Ambulatory Visit: Payer: Self-pay | Admitting: Family Medicine

## 2018-05-31 ENCOUNTER — Other Ambulatory Visit (HOSPITAL_COMMUNITY): Payer: Self-pay

## 2018-05-31 DIAGNOSIS — E782 Mixed hyperlipidemia: Secondary | ICD-10-CM

## 2018-05-31 DIAGNOSIS — K760 Fatty (change of) liver, not elsewhere classified: Secondary | ICD-10-CM

## 2018-05-31 MED ORDER — ARIPIPRAZOLE 10 MG PO TABS: 30.0000 mg | ORAL_TABLET | Freq: Every day | ORAL | 0 refills | Status: DC

## 2018-05-31 NOTE — Telephone Encounter (Signed)
Last lipid in 2017

## 2018-05-31 NOTE — Telephone Encounter (Signed)
I will send in refill.  Have her come for fasting labs at her convenience.

## 2018-06-07 DIAGNOSIS — M25512 Pain in left shoulder: Secondary | ICD-10-CM | POA: Diagnosis not present

## 2018-06-09 ENCOUNTER — Encounter: Payer: Self-pay | Admitting: Family Medicine

## 2018-06-09 ENCOUNTER — Ambulatory Visit (INDEPENDENT_AMBULATORY_CARE_PROVIDER_SITE_OTHER): Payer: Medicare Other | Admitting: Family Medicine

## 2018-06-09 ENCOUNTER — Other Ambulatory Visit: Payer: Self-pay | Admitting: Family Medicine

## 2018-06-09 VITALS — BP 122/82 | HR 73 | Temp 97.6°F | Ht 62.0 in | Wt 209.0 lb

## 2018-06-09 DIAGNOSIS — R42 Dizziness and giddiness: Secondary | ICD-10-CM | POA: Diagnosis not present

## 2018-06-09 DIAGNOSIS — R5381 Other malaise: Secondary | ICD-10-CM

## 2018-06-09 NOTE — Progress Notes (Signed)
Subjective: CC: dizziness PCP: [name], DO TTS:[id number] D Amber Stephenson is a 73 y.o. female presenting to clinic today for:  1. Dizziness Patient reports several week history of dizziness which she describes as room spinning.  Denies any loss of consciousness or sensation of lightheadedness.  She does state that the vertigo seems to be affecting her balance.  Denies any recent URI, decreased hearing or ear fullness.  No recent changes in medications.    2.  Generalized weakness Patient reports that she feels generalized weakness and fatigue that is been ongoing for several months.  She states that she easily gets fatigued with normal activity since she was ill.  Additionally, she does not feel as strong as she used to be.  She is in physical therapy for a shoulder and would be interested in seeing them for building her core and lower extremities.  She wonders if symptoms might be related to fibromyalgia.  Denies any specific tender points in the body or depressive symptoms.  She is seen by pain management for chronic pain needs.  She has multiple comorbidities including heart failure, atrial fibrillation, fatty liver, type 2 diabetes, Bipolar disorder and various orthopedic issues.  ROS: Per HPI  Allergies  Allergen Reactions  . Ivp Dye [Iodinated Diagnostic Agents] Anaphylaxis and Swelling    Throat closes   Past Medical History:  Diagnosis Date  . Atrial fibrillation ([place])   . Bipolar affective ([place])   . CHF (congestive heart failure) ([place])   . Chronic back pain   . Diabetes mellitus without complication ([place])   . Hyperlipidemia   . Hypertension   . Pain management   . Renal insufficiency     Current Outpatient Medications:  .  ARIPiprazole (ABILIFY) 10 MG tablet, Take 3 tablets (30 mg total) by mouth daily., Disp: 60 tablet, Rfl: 0 .  atorvastatin (LIPITOR) 40 MG tablet, TAKE 1 TABLET DAILY, Disp: 90 tablet, Rfl: 1 .  Blood Glucose Monitoring Suppl (FREESTYLE LITE) DEVI,  Use to check blood sugar tid. DX E11.22, Disp: 1 each, Rfl: 0 .  budesonide-formoterol (SYMBICORT) 80-4.5 MCG/ACT inhaler, Inhale 2 puffs into the lungs 2 (two) times daily., Disp: 2 Inhaler, Rfl: 0 .  Cholecalciferol (VITAMIN D3) 1000 UNITS CAPS, Take 1,000 Units by mouth daily., Disp: , Rfl:  .  Dulaglutide (TRULICITY) 1.5 QZ/0.0PQ SOPN, Inject 1.5 mg into the skin once a week., Disp: 12 pen, Rfl: 3 .  ELIQUIS 5 MG TABS tablet, TAKE 1 TABLET TWICE A DAY (NEED TO BE SEEN BEFORE NEXT REFILL), Disp: 180 tablet, Rfl: 1 .  Eszopiclone (ESZOPICLONE) 3 MG TABS, Take 1 tablet (3 mg total) by mouth at bedtime as needed. Take immediately before bedtime, Disp: 30 tablet, Rfl: 4 .  FREESTYLE LITE test strip, USE FOR TESTING SUGAR THREE TIMES DAILY, Disp: 100 each, Rfl: 3 .  furosemide (LASIX) 20 MG tablet, Take 1 tablet (20 mg total) by mouth 2 (two) times daily., Disp: 180 tablet, Rfl: 1 .  Insulin Glargine (LANTUS SOLOSTAR) 100 UNIT/ML Solostar Pen, INJECT 60 UNITS UNDER THE SKIN TWICE A DAY (Patient taking differently: Inject 60 Units into the skin 2 (two) times daily. ), Disp: 90 mL, Rfl: 0 .  Insulin Pen Needle (SURE COMFORT PEN NEEDLES) 32G X 4 MM MISC, Use with insulin pens as directed. Dx E11.9, Disp: 300 each, Rfl: 11 .  ketoconazole (NIZORAL) 2 % cream, Apply to affected areas in groin region ONE time daily for 3-4 weeks., Disp: 60 g,  Rfl: 1 .  Lancets (FREESTYLE) lancets, Test tid. DX E11.22, Disp: 100 each, Rfl: 5 .  LORazepam (ATIVAN) 1 MG tablet, Take 1 tab (1 mg) in the morning and 2 tabs (2 mg ) at bedtime, Disp: 90 tablet, Rfl: 3 .  methocarbamol (ROBAXIN) 500 MG tablet, Take 1 tablet (500 mg total) by mouth every 8 (eight) hours as needed for muscle spasms., Disp: 15 tablet, Rfl: 0 .  metoprolol tartrate (LOPRESSOR) 100 MG tablet, Take 1 tablet (100 mg total) by mouth 2 (two) times daily. Take with metoprolol 25 mg to equal 125 mg, Disp: 28 tablet, Rfl: 0 .  metoprolol tartrate (LOPRESSOR) 25  MG tablet, Take 1 tablet (25 mg total) by mouth 2 (two) times daily. Take with metoprolol 100 mg to equal 125 mg, Disp: 28 tablet, Rfl: 0 .  naloxone (NARCAN) nasal spray 4 mg/0.1 mL, Place 1 spray into the nose once as needed (opiod overdose)., Disp: , Rfl:  .  pregabalin (LYRICA) 150 MG capsule, Take 1 capsule (150 mg total) by mouth 2 (two) times daily., Disp: 180 capsule, Rfl: 2 .  rizatriptan (MAXALT-MLT) 10 MG disintegrating tablet, DISSOLVE UNDER TONGUE AT FIRST SIGN OF HEADACHE MAY REAPT IN 1 HR AS NEEDED MAX OF 3/24HR (Needs to be seen before next refill), Disp: 10 tablet, Rfl: 0 .  traZODone (DESYREL) 100 MG tablet, 4  qhs, Disp: 120 tablet, Rfl: 3 .  TRICOR 145 MG tablet, TAKE 1 TABLET DAILY (NEED TO BE SEEN BEFORE NEXT REFILL), Disp: 90 tablet, Rfl: 3 .  diltiazem (CARDIZEM CD) 120 MG 24 hr capsule, Take 1 capsule (120 mg total) by mouth at bedtime. (Patient not taking: Reported on 06/09/2018), Disp: 90 capsule, Rfl: 3  Current Facility-Administered Medications:  .  eszopiclone (LUNESTA) tablet 3 mg, 3 mg, Oral, QHS PRN, Norma Fredrickson, MD Social History   Socioeconomic History  . Marital status: Married    Spouse name: Not on file  . Number of children: 3  . Years of education: 16  . Highest education level: Bachelor's degree (e.g., BA, AB, BS)  Occupational History  . Not on file  Social Needs  . Financial resource strain: Not hard at all  . Food insecurity:    Worry: Never true    Inability: Never true  . Transportation needs:    Medical: No    Non-medical: No  Tobacco Use  . Smoking status: Never Smoker  . Smokeless tobacco: Never Used  Substance and Sexual Activity  . Alcohol use: No    Alcohol/week: 0.0 standard drinks  . Drug use: No  . Sexual activity: Yes    Partners: Male    Birth control/protection: Post-menopausal  Lifestyle  . Physical activity:    Days per week: 0 days    Minutes per session: 0 min  . Stress: Not at all  Relationships  . Social  connections:    Talks on phone: More than three times a week    Gets together: Once a week    Attends religious service: More than 4 times per year    Active member of club or organization: No    Attends meetings of clubs or organizations: Never    Relationship status: Married  . Intimate partner violence:    Fear of current or ex partner: No    Emotionally abused: No    Physically abused: No    Forced sexual activity: No  Other Topics Concern  . Not on file  Social History  Narrative   Lives at home with husband.    Family History  Problem Relation Age of Onset  . Diabetes Mother   . Heart disease Mother   . Heart disease Brother   . Heart disease Sister        CABG  . Alcohol abuse Sister   . Mental illness Brother   . Diabetes Brother     Objective: Office vital signs reviewed. BP 122/82   Pulse 73   Temp 97.6 F (36.4 C) (Oral)   Ht 5\' 2"  (1.575 m)   Wt 209 lb (94.8 kg)   BMI 38.23 kg/m   Physical Examination:  General: Awake, alert, well nourished, brighter appearing today.  No acute distress HEENT: Normal, sclera white, PERRLA, EOMI, MMM, bilateral TMs intact and appear within normal limits.  No evidence of erythema, bulging.  No obstruction or purulence. Cardio: regular rate and rhythm, S1S2 heard, no murmurs appreciated Pulm: clear to auscultation bilaterally, no wheezes, rhonchi or rales; normal work of breathing on room air MSK: slow gait and hunched station; appears to be globally deconditioned. Neuro: Follows commands.  Normal upper extremity cerebellar testing.  Assessment/ Plan: 73 y.o. female   1. Vertigo ?  BPPV though does not seem to be worsened by turning head to 1 side or the other.  No structural abnormalities appreciated on her ear exam today.  I placed a referral to ear nose and throat in [place] per her request.  We also discussed possibility for vestibular rehab.  She would like to see the ear nose and throat doctor first.  We also  discussed the possibility of medication-induced dizziness but she seems adamant that is not related to her medication. - Ambulatory referral to ENT  2. Physical deconditioning She is on multiple sedating medications which may cause some fatigue though I suspect that her recent hospitalization is contributing to physical deconditioning.  I placed a referral to physical therapy to see if they may help with core and balance.  We will continue to monitor this. - Ambulatory referral to Physical Therapy   Orders Placed This Encounter  Procedures  . Ambulatory referral to ENT    Referral Priority:   Routine    Referral Type:   Consultation    Referral Reason:   Specialty Services Required    Requested Specialty:   Otolaryngology    Number of Visits Requested:   1  . Ambulatory referral to Physical Therapy    Referral Priority:   Routine    Referral Type:   Physical Medicine    Referral Reason:   Specialty Services Required    Requested Specialty:   Physical Therapy    Number of Visits Requested:   1   No orders of the defined types were placed in this encounter.    [name], DO [place] [phone]

## 2018-06-09 NOTE — Patient Instructions (Signed)
You have been referred to Dr. Benjamine Mola the ear nose and throat doctor locally.  You will be contacted for an appointment.  I have added an additional request to your physical therapist for strength training of your core and legs to help with the generalized weakness you are feeling.  I do think that this is likely deconditioning because of your substantial illnesses as of recent.   Deconditioning Deconditioning refers to the changes in your body that occur during a period of inactivity. The changes happen in your heart, lungs, and muscles. They decrease your ability to be active, and they make you feel tired and weak. There are three stages of deconditioning:  Mild deconditioning. At this stage, you will notice a change in your ability to do your usual exercise activities, such as running, biking, or swimming.  Moderate deconditioning. At this stage, you will notice a change in your ability to do normal everyday activities, such as walking, grocery shopping, and doing chores.  Severe deconditioning. At this stage, you will notice a change in your ability to do minimal activity or normal self-care. Deconditioning can occur after only a few days of inactivity. The longer the period of inactivity, the more severe the deconditioning will be, and the longer it will take to return to your previous level of functioning. What are the causes? Deconditioning is often caused by inactivity due to:  Illnesses, such as cancer, stroke, heart attack, fibromyalgia, and chronic fatigue syndrome.  Injuries, especially back injuries, broken bones, and ligament and tendon injuries.  A long stay in the hospital.  Pregnancy, especially if long periods of bed rest are needed. What increases the risk? This condition is more likely to develop in:  People who are hospitalized.  People on bed rest.  People who are obese.  People with poor nutrition.  Elderly adults.  People with injuries or illnesses that  interfere with movement and activity. What are the signs or symptoms? Symptoms of deconditioning include:  Weakness.  Tiredness.  Shortness of breath with minor exertion.  A faster-than-normal heartbeat. You may not notice this without taking your pulse.  Pain or discomfort with activity.  Decreased strength.  Decreased sense of balance.  Decreased endurance.  Difficulty doing your usual forms of exercise.  Difficulty doing activities of daily living, such as grocery shopping or chores.  Difficulty walking around the house and doing basic self-care, such as getting to the bathroom, preparing meals, or doing laundry. How is this diagnosed? Deconditioning is diagnosed based on your medical history and a physical exam. During the physical exam, your health care provider will check for signs of deconditioning, such as:  Decreased size of muscles.  Decreased strength.  Trouble with balance.  Shortness of breath or abnormally increased heart rate after minor exertion. How is this treated? Treatment for deconditioning usually involves following a structured exercise program in which activity is increased gradually. Your health care provider will determine which exercises are right for you. The exercise program will likely include aerobic exercise and strength training:  Aerobic exercise helps improve the functioning of the heart and lungs as well as the muscles.  Strength training helps improve muscle size and strength. Both of these types of exercise will improve your endurance. You may be referred to a physical therapist who can create a safe strengthening program for you to follow. Follow these instructions at home:  Follow the exercise program that is recommended by your health care provider or physical therapist.  Do not  increase your exercise any faster than directed.  Eat a healthy diet.  Do not use any products that contain nicotine or tobacco, such as cigarettes  and e-cigarettes. If you need help quitting, ask your health care provider.  Take over-the-counter and prescription medicines only as told by your health care provider.  Keep all follow-up visits as told by your health care provider. This is important. Contact a health care provider if:  You are not able to carry out the prescribed exercise program.  You are becoming more and more fatigued and weak.  You become light-headed when rising to a sitting or standing position.  Your level of endurance decreases after it has improved. Get help right away if:  You have chest pain.  You are very short of breath.  You have any episodes of passing out. This information is not intended to replace advice given to you by your health care provider. Make sure you discuss any questions you have with your health care provider. Document Released: 10/03/2013 Document Revised: 12/07/2015 Document Reviewed: 08/18/2015 Elsevier Interactive Patient Education  2019 [employer].

## 2018-06-10 DIAGNOSIS — M25512 Pain in left shoulder: Secondary | ICD-10-CM | POA: Diagnosis not present

## 2018-06-11 ENCOUNTER — Other Ambulatory Visit (HOSPITAL_COMMUNITY): Payer: Self-pay | Admitting: Psychiatry

## 2018-06-11 DIAGNOSIS — M5136 Other intervertebral disc degeneration, lumbar region: Secondary | ICD-10-CM | POA: Diagnosis not present

## 2018-06-11 DIAGNOSIS — G629 Polyneuropathy, unspecified: Secondary | ICD-10-CM | POA: Diagnosis not present

## 2018-06-11 DIAGNOSIS — Z79899 Other long term (current) drug therapy: Secondary | ICD-10-CM | POA: Diagnosis not present

## 2018-06-11 DIAGNOSIS — M545 Low back pain: Secondary | ICD-10-CM | POA: Diagnosis not present

## 2018-06-15 ENCOUNTER — Other Ambulatory Visit: Payer: Self-pay | Admitting: *Deleted

## 2018-06-15 DIAGNOSIS — L84 Corns and callosities: Secondary | ICD-10-CM | POA: Diagnosis not present

## 2018-06-15 DIAGNOSIS — E1151 Type 2 diabetes mellitus with diabetic peripheral angiopathy without gangrene: Secondary | ICD-10-CM | POA: Diagnosis not present

## 2018-06-15 DIAGNOSIS — M79676 Pain in unspecified toe(s): Secondary | ICD-10-CM | POA: Diagnosis not present

## 2018-06-15 DIAGNOSIS — B351 Tinea unguium: Secondary | ICD-10-CM | POA: Diagnosis not present

## 2018-06-17 ENCOUNTER — Other Ambulatory Visit: Payer: Self-pay

## 2018-06-17 DIAGNOSIS — M25512 Pain in left shoulder: Secondary | ICD-10-CM | POA: Diagnosis not present

## 2018-06-17 DIAGNOSIS — S42202D Unspecified fracture of upper end of left humerus, subsequent encounter for fracture with routine healing: Secondary | ICD-10-CM | POA: Diagnosis not present

## 2018-06-17 MED ORDER — FUROSEMIDE 20 MG PO TABS: 20.0000 mg | ORAL_TABLET | Freq: Two times a day (BID) | ORAL | 1 refills | Status: DC

## 2018-06-21 ENCOUNTER — Other Ambulatory Visit: Payer: Self-pay | Admitting: Nurse Practitioner

## 2018-06-23 ENCOUNTER — Ambulatory Visit (INDEPENDENT_AMBULATORY_CARE_PROVIDER_SITE_OTHER): Payer: Medicare Other | Admitting: Psychiatry

## 2018-06-23 ENCOUNTER — Encounter (HOSPITAL_COMMUNITY): Payer: Self-pay | Admitting: Psychiatry

## 2018-06-23 VITALS — BP 119/78 | HR 80 | Ht 62.0 in | Wt 206.0 lb

## 2018-06-23 DIAGNOSIS — F3170 Bipolar disorder, currently in remission, most recent episode unspecified: Secondary | ICD-10-CM | POA: Diagnosis not present

## 2018-06-23 MED ORDER — TRAZODONE HCL 100 MG PO TABS: ORAL_TABLET | ORAL | 4 refills | Status: DC

## 2018-06-23 MED ORDER — ARIPIPRAZOLE 10 MG PO TABS: 30.0000 mg | ORAL_TABLET | Freq: Every day | ORAL | 4 refills | Status: DC

## 2018-06-23 MED ORDER — ESZOPICLONE 3 MG PO TABS: 3.0000 mg | ORAL_TABLET | Freq: Every evening | ORAL | 4 refills | Status: DC | PRN

## 2018-06-23 MED ORDER — LORAZEPAM 1 MG PO TABS: ORAL_TABLET | ORAL | 4 refills | Status: DC

## 2018-06-23 NOTE — Progress Notes (Signed)
Patient ID: Amber Stephenson, female   DOB: Nov 17, 1945, 73 y.o.   MRN: [id number] [facility] MD Progress Note .she's going to apply 06/23/2018 2:50 PM Amber Stephenson  MRN:  [id number] Subjective:  Doing well Principal Problem: Bipolar disorder Diagnosis: Bipolar disorder  This patient has been seen at this clinic for a number of years now for bipolar disorder manic type.  Despite a number of medical problems she is actually doing well.  She denies any depression, euphoria or irritability.  She is sleeping very well since the addition of Lunesta.  Her appetite is stable.  Her weight has not changed that much but in the last 10 years she has gained 10 pounds.  The patient has good energy.  She can think and concentrate without problems.  She is a good sense of work.  She is not grandiose.  She enjoys reading watching TV.  She is enjoying life.  She denies the use of alcohol or drugs.  Her medical problems include pulmonary hypertension and she is seeing a pulmonologist tomorrow.  Patient also has heart disease and is followed by cardiologist.  Today patient had a  AIM scale that showed no evidence of tardive dyskinesia.  Patient also has diabetes and her last hemoglobin A1c was a value of 7.  Overall the patient is actually doing fairly well.  She does have some problems with falls but mainly is because she slips.  I do not think she is oversedated.  The patient denies being fatigued in any way.  She takes her medicines just as prescribed.. Past Medical History:  Diagnosis Date  . Atrial fibrillation ([place])   . Bipolar affective ([place])   . CHF (congestive heart failure) ([place])   . Chronic back pain   . Diabetes mellitus without complication ([place])   . Hyperlipidemia   . Hypertension   . Pain management   . Renal insufficiency     Past Surgical History:  Procedure Laterality Date  . BREAST REDUCTION SURGERY    . EYE SURGERY Right    cateracts  . HAMMER TOE SURGERY    . LEFT HEART CATH AND CORONARY  ANGIOGRAPHY N/A 02/03/2018   Procedure: LEFT HEART CATH AND CORONARY ANGIOGRAPHY;  Surgeon: Martinique, Peter M, MD;  Location: [facility];  Service: Cardiovascular;  Laterality: N/A;  . SHOULDER SURGERY Right   . THIGH SURGERY     Family History:  Family History  Problem Relation Age of Onset  . Diabetes Mother   . Heart disease Mother   . Heart disease Brother   . Heart disease Sister        CABG  . Alcohol abuse Sister   . Mental illness Brother   . Diabetes Brother    Family Psychiatric  History:  Social History:  Social History   Substance and Sexual Activity  Alcohol Use No  . Alcohol/week: 0.0 standard drinks     Social History   Substance and Sexual Activity  Drug Use No    Social History   Socioeconomic History  . Marital status: Married    Spouse name: Not on file  . Number of children: 3  . Years of education: 16  . Highest education level: Bachelor's degree (e.g., BA, AB, BS)  Occupational History  . Not on file  Social Needs  . Financial resource strain: Not hard at all  . Food insecurity:    Worry: Never true    Inability: Never true  . Transportation needs:  Medical: No    Non-medical: No  Tobacco Use  . Smoking status: Never Smoker  . Smokeless tobacco: Never Used  Substance and Sexual Activity  . Alcohol use: No    Alcohol/week: 0.0 standard drinks  . Drug use: No  . Sexual activity: Yes    Partners: Male    Birth control/protection: Post-menopausal  Lifestyle  . Physical activity:    Days per week: 0 days    Minutes per session: 0 min  . Stress: Not at all  Relationships  . Social connections:    Talks on phone: More than three times a week    Gets together: Once a week    Attends religious service: More than 4 times per year    Active member of club or organization: No    Attends meetings of clubs or organizations: Never    Relationship status: Married  Other Topics Concern  . Not on file  Social History Narrative    Lives at home with husband.    Additional Social History:                         Sleep: Good  Appetite:  Good  Current Medications: Current Outpatient Medications  Medication Sig Dispense Refill  . ARIPiprazole (ABILIFY) 10 MG tablet Take 3 tablets (30 mg total) by mouth daily. 90 tablet 4  . atorvastatin (LIPITOR) 40 MG tablet TAKE 1 TABLET DAILY 90 tablet 1  . Blood Glucose Monitoring Suppl (FREESTYLE LITE) DEVI Use to check blood sugar tid. DX E11.22 1 each 0  . budesonide-formoterol (SYMBICORT) 80-4.5 MCG/ACT inhaler Inhale 2 puffs into the lungs 2 (two) times daily. 2 Inhaler 0  . Cholecalciferol (VITAMIN D3) 1000 UNITS CAPS Take 1,000 Units by mouth daily.    . Dulaglutide (TRULICITY) 1.5 FX/9.0WI SOPN Inject 1.5 mg into the skin once a week. 12 pen 3  . ELIQUIS 5 MG TABS tablet TAKE 1 TABLET TWICE A DAY (NEED TO BE SEEN BEFORE NEXT REFILL) 180 tablet 1  . Eszopiclone (ESZOPICLONE) 3 MG TABS Take 1 tablet (3 mg total) by mouth at bedtime as needed. Take immediately before bedtime 30 tablet 4  . FREESTYLE LITE test strip USE FOR TESTING SUGAR THREE TIMES DAILY 100 each 3  . furosemide (LASIX) 20 MG tablet Take 1 tablet (20 mg total) by mouth 2 (two) times daily. 180 tablet 1  . Insulin Glargine (LANTUS SOLOSTAR) 100 UNIT/ML Solostar Pen INJECT 60 UNITS UNDER THE SKIN TWICE A DAY (Patient taking differently: Inject 60 Units into the skin 2 (two) times daily. ) 90 mL 0  . Insulin Pen Needle (SURE COMFORT PEN NEEDLES) 32G X 4 MM MISC Use with insulin pens as directed. Dx E11.9 300 each 11  . ketoconazole (NIZORAL) 2 % cream Apply to affected areas in groin region ONE time daily for 3-4 weeks. 60 g 1  . Lancets (FREESTYLE) lancets Test tid. DX E11.22 100 each 5  . LORazepam (ATIVAN) 1 MG tablet Take 1 tab (1 mg) in the morning and 2 tabs (2 mg ) at bedtime 90 tablet 4  . methocarbamol (ROBAXIN) 500 MG tablet Take 1 tablet (500 mg total) by mouth every 8 (eight) hours as needed  for muscle spasms. 15 tablet 0  . metoprolol tartrate (LOPRESSOR) 100 MG tablet Take 1 tablet (100 mg total) by mouth 2 (two) times daily. Take with metoprolol 25 mg to equal 125 mg 28 tablet 0  . metoprolol  tartrate (LOPRESSOR) 25 MG tablet Take 1 tablet (25 mg total) by mouth 2 (two) times daily. Take with metoprolol 100 mg to equal 125 mg 28 tablet 0  . naloxone (NARCAN) nasal spray 4 mg/0.1 mL Place 1 spray into the nose once as needed (opiod overdose).    . pregabalin (LYRICA) 150 MG capsule Take 1 capsule (150 mg total) by mouth 2 (two) times daily. 180 capsule 2  . rizatriptan (MAXALT-MLT) 10 MG disintegrating tablet DISSOLVE UNDER TONGUE AT FIRST SIGN OF HEADACHE MAY REPEAT IN 1 HR AS NEEDED 10 tablet 2  . traZODone (DESYREL) 100 MG tablet 4  qhs 120 tablet 4  . TRICOR 145 MG tablet TAKE 1 TABLET DAILY (NEED TO BE SEEN BEFORE NEXT REFILL) 90 tablet 3  . diltiazem (CARDIZEM CD) 120 MG 24 hr capsule Take 1 capsule (120 mg total) by mouth at bedtime. (Patient not taking: Reported on 06/09/2018) 90 capsule 3   No current facility-administered medications for this visit.     Lab Results: No results found for this or any previous visit (from the past 48 hour(s)).  Blood Alcohol level:  Lab Results  Component Value Date   ETH <5 10/01/2015    Physical Findings: AIMS:  , ,  ,  ,    CIWA:    COWS:     Musculoskeletal: Strength & Muscle Tone: within normal limits Gait & Station: normal Patient leans: N/A  Psychiatric Specialty Exam: ROS Patient ID: Amber Stephenson, female   DOB: 08-May-1946, 63 y.o.   MRN: [id number] [facility] MD Progress Note .she's going to apply 06/23/2018 2:50 PM Amber Stephenson  MRN:  [id number] Subjective:  Doing well Principal Problem: Bipolar disorder most recent episode manic Diagnosis:  Bipolar disorder most recent   Today the patient is seen with her husband. Actually this is about a month earlier than expected. The actually having no new problems. The  patient does describe her anxiety level is somewhat higher bullous suspect is related to the fact that her daughter who is recovering out of his living with them now. Apparently does some degree of tension between her and her daughter who she is with every day. Patient also has multiple medical problems.She seen doctors every week having tests. The patient denies depression. She denies any signs of mania. She's not irritable. She is not 4. She does not have racing thinking. Her appetite is normal. She does complain other sleep is not for her. She's having hard time staying asleep. She goes to sleep and takes 3 trazodone. Patient is no evidence of psychosis. She is not grandiose. She's not suicidal. Her husband is stable. They likely home they live in. The patient takes her medicines as prescribed. The patient drinks no alcohol uses no drugs. They're planning a trip to [place]. The patient shows no evidence of tardive dyskinesia. Past Medical History:  Diagnosis Date  . Atrial fibrillation ([place])   . Bipolar affective ([place])   . CHF (congestive heart failure) ([place])   . Chronic back pain   . Diabetes mellitus without complication ([place])   . Hyperlipidemia   . Hypertension   . Pain management   . Renal insufficiency     Past Surgical History:  Procedure Laterality Date  . BREAST REDUCTION SURGERY    . EYE SURGERY Right    cateracts  . HAMMER TOE SURGERY    . LEFT HEART CATH AND CORONARY ANGIOGRAPHY N/A 02/03/2018   Procedure: LEFT HEART CATH AND CORONARY  ANGIOGRAPHY;  Surgeon: Martinique, Peter M, MD;  Location: [facility];  Service: Cardiovascular;  Laterality: N/A;  . SHOULDER SURGERY Right   . THIGH SURGERY     Family History:  Family History  Problem Relation Age of Onset  . Diabetes Mother   . Heart disease Mother   . Heart disease Brother   . Heart disease Sister        CABG  . Alcohol abuse Sister   . Mental illness Brother   . Diabetes Brother    Family Psychiatric   History:  Social History:  Social History   Substance and Sexual Activity  Alcohol Use No  . Alcohol/week: 0.0 standard drinks     Social History   Substance and Sexual Activity  Drug Use No    Social History   Socioeconomic History  . Marital status: Married    Spouse name: Not on file  . Number of children: 3  . Years of education: 16  . Highest education level: Bachelor's degree (e.g., BA, AB, BS)  Occupational History  . Not on file  Social Needs  . Financial resource strain: Not hard at all  . Food insecurity:    Worry: Never true    Inability: Never true  . Transportation needs:    Medical: No    Non-medical: No  Tobacco Use  . Smoking status: Never Smoker  . Smokeless tobacco: Never Used  Substance and Sexual Activity  . Alcohol use: No    Alcohol/week: 0.0 standard drinks  . Drug use: No  . Sexual activity: Yes    Partners: Male    Birth control/protection: Post-menopausal  Lifestyle  . Physical activity:    Days per week: 0 days    Minutes per session: 0 min  . Stress: Not at all  Relationships  . Social connections:    Talks on phone: More than three times a week    Gets together: Once a week    Attends religious service: More than 4 times per year    Active member of club or organization: No    Attends meetings of clubs or organizations: Never    Relationship status: Married  Other Topics Concern  . Not on file  Social History Narrative   Lives at home with husband.    Additional Social History:                         Sleep: Good  Appetite:  Good  Current Medications: Current Outpatient Medications  Medication Sig Dispense Refill  . ARIPiprazole (ABILIFY) 10 MG tablet Take 3 tablets (30 mg total) by mouth daily. 90 tablet 4  . atorvastatin (LIPITOR) 40 MG tablet TAKE 1 TABLET DAILY 90 tablet 1  . Blood Glucose Monitoring Suppl (FREESTYLE LITE) DEVI Use to check blood sugar tid. DX E11.22 1 each 0  . budesonide-formoterol  (SYMBICORT) 80-4.5 MCG/ACT inhaler Inhale 2 puffs into the lungs 2 (two) times daily. 2 Inhaler 0  . Cholecalciferol (VITAMIN D3) 1000 UNITS CAPS Take 1,000 Units by mouth daily.    . Dulaglutide (TRULICITY) 1.5 TK/2.4OX SOPN Inject 1.5 mg into the skin once a week. 12 pen 3  . ELIQUIS 5 MG TABS tablet TAKE 1 TABLET TWICE A DAY (NEED TO BE SEEN BEFORE NEXT REFILL) 180 tablet 1  . Eszopiclone (ESZOPICLONE) 3 MG TABS Take 1 tablet (3 mg total) by mouth at bedtime as needed. Take immediately before bedtime 30 tablet  4  . FREESTYLE LITE test strip USE FOR TESTING SUGAR THREE TIMES DAILY 100 each 3  . furosemide (LASIX) 20 MG tablet Take 1 tablet (20 mg total) by mouth 2 (two) times daily. 180 tablet 1  . Insulin Glargine (LANTUS SOLOSTAR) 100 UNIT/ML Solostar Pen INJECT 60 UNITS UNDER THE SKIN TWICE A DAY (Patient taking differently: Inject 60 Units into the skin 2 (two) times daily. ) 90 mL 0  . Insulin Pen Needle (SURE COMFORT PEN NEEDLES) 32G X 4 MM MISC Use with insulin pens as directed. Dx E11.9 300 each 11  . ketoconazole (NIZORAL) 2 % cream Apply to affected areas in groin region ONE time daily for 3-4 weeks. 60 g 1  . Lancets (FREESTYLE) lancets Test tid. DX E11.22 100 each 5  . LORazepam (ATIVAN) 1 MG tablet Take 1 tab (1 mg) in the morning and 2 tabs (2 mg ) at bedtime 90 tablet 4  . methocarbamol (ROBAXIN) 500 MG tablet Take 1 tablet (500 mg total) by mouth every 8 (eight) hours as needed for muscle spasms. 15 tablet 0  . metoprolol tartrate (LOPRESSOR) 100 MG tablet Take 1 tablet (100 mg total) by mouth 2 (two) times daily. Take with metoprolol 25 mg to equal 125 mg 28 tablet 0  . metoprolol tartrate (LOPRESSOR) 25 MG tablet Take 1 tablet (25 mg total) by mouth 2 (two) times daily. Take with metoprolol 100 mg to equal 125 mg 28 tablet 0  . naloxone (NARCAN) nasal spray 4 mg/0.1 mL Place 1 spray into the nose once as needed (opiod overdose).    . pregabalin (LYRICA) 150 MG capsule Take 1  capsule (150 mg total) by mouth 2 (two) times daily. 180 capsule 2  . rizatriptan (MAXALT-MLT) 10 MG disintegrating tablet DISSOLVE UNDER TONGUE AT FIRST SIGN OF HEADACHE MAY REPEAT IN 1 HR AS NEEDED 10 tablet 2  . traZODone (DESYREL) 100 MG tablet 4  qhs 120 tablet 4  . TRICOR 145 MG tablet TAKE 1 TABLET DAILY (NEED TO BE SEEN BEFORE NEXT REFILL) 90 tablet 3  . diltiazem (CARDIZEM CD) 120 MG 24 hr capsule Take 1 capsule (120 mg total) by mouth at bedtime. (Patient not taking: Reported on 06/09/2018) 90 capsule 3   No current facility-administered medications for this visit.     Lab Results: No results found for this or any previous visit (from the past 48 hour(s)).  Blood Alcohol level:  Lab Results  Component Value Date   ETH <5 10/01/2015    Physical Findings: AIMS:  , ,  ,  ,    CIWA:    COWS:     Musculoskeletal: Strength & Muscle Tone: within normal limits Gait & Station: normal Patient leans: N/A  Psychiatric Specialty Exam: ROS  Blood pressure 119/78, pulse 80, height 5\' 2"  (1.575 m), weight 206 lb (93.4 kg), SpO2 95 %.Body mass index is 37.68 kg/m.  General Appearance: Casual  Eye Contact::  Good  Speech:  Clear and Coherent  Volume:  Normal  Mood:  Euthymic  Affect:  Appropriate  Thought Process:  Coherent  Orientation:  Full (Time, Place, and Person)  Thought Content:  WDL  Suicidal Thoughts:  No  Homicidal Thoughts:  No  Memory:  NA  Judgement:  Good  Insight:  Good  Psychomotor Activity:  Normal  Concentration:  Good  Recall:  Good  Fund of Knowledge:Good  Language: Good  Akathisia:  No  Handed:  Right  AIMS (if indicated):  Assets: Good spirits   ADL's:  Intact  Cognition: WNL  Sleep:     eks Treatment Plan Summary: This patient's first admission important problem is bipolar disorder. She takes multiple medications for this condition. This includes Tegretol and Abilify. At this time the patient is not in therapy. She'll make an attempt to get  a therapist in her community. She's taking a moderate to high dose of Ativan 1 mg one in the morning and 2 at night and requested more of it. At this time we will not increase the dose of Ativan. We will have her response to her second problem which is her problems sleeping. She takes 3 trazodone to sleep and doesn't stay asleep. At this time she'll continue taking the trazodone but she'll begin on doxepin 50 mg in addition. Today we spent more than 50% of the time talking about her medications and educating her. She understands that she cannot drink alcohol with her medications and she'll actually does not. The patient stays busy and active. She does importance of being in therapy and she'll actively looking for a therapist at this time. She'll return to see me in 3 months for a 30 minute visit. At that time consider getting a Tegretol blood level and other blood work.patient denies any physical complaints of chest pain shortness of breath.  Blood pressure 119/78, pulse 80, height 5\' 2"  (1.575 m), weight 206 lb (93.4 kg), SpO2 95 %.Body mass index is 37.68 kg/m.  General Appearance: Casual  Eye Contact::  Good  Speech:  Clear and Coherent  Volume:  Normal  Mood:  Euthymic  Affect:  Appropriate  Thought Process:  Coherent  Orientation:  Full (Time, Place, and Person)  Thought Content:  WDL  Suicidal Thoughts:  No  Homicidal Thoughts:  No  Memory:  NA  Judgement:  Good  Insight:  Good  Psychomotor Activity:  Normal  Concentration:  Good  Recall:  Good  Fund of Knowledge:Good  Language: Good  Akathisia:  No  Handed:  Right  AIMS (if indicated):     Assets: Good spirits   ADL's:  Intact  Cognition: WNL  Sleep:     eks Treatment Plan Summary: This patient is to chronic problems.  Her first problem is that of bipolar disorder manic type.  Presently she takes high-dose Abilify 30 mg.  She is been on this for over 15 years.  I am hesitant to change it.  She shows no evidence of tardive  dyskinesia and no significant metabolic consequences from it.  The possibility that this contributed to her weight gain is not out of the question but actually her weight over the last few years has been completely stable.  Over 10 years she is gained 10 pounds.  The other medicine she takes is Tegretol.  Her second problem is that of insomnia.  The patient takes trazodone 400 mg and Lunesta 3 mg and does well.  Noted also the patient takes Ativan 1 mg 1 in the morning and 2 at night.  In essence her anxiety is fairly well controlled and her bipolar disorder is very well controlled.  Patient is not suicidal.  She is functioning extremely well.  I describe her symptomatology is being in a mild level.  She is not fatigued.  She will return to see me in 3 months.

## 2018-06-24 ENCOUNTER — Ambulatory Visit (INDEPENDENT_AMBULATORY_CARE_PROVIDER_SITE_OTHER): Payer: Medicare Other | Admitting: Family Medicine

## 2018-06-24 ENCOUNTER — Telehealth: Payer: Medicare Other

## 2018-06-24 ENCOUNTER — Encounter: Payer: Self-pay | Admitting: Family Medicine

## 2018-06-24 VITALS — BP 137/90 | HR 80 | Temp 97.3°F | Ht 62.0 in | Wt 205.4 lb

## 2018-06-24 DIAGNOSIS — H00012 Hordeolum externum right lower eyelid: Secondary | ICD-10-CM

## 2018-06-24 DIAGNOSIS — M25512 Pain in left shoulder: Secondary | ICD-10-CM | POA: Diagnosis not present

## 2018-06-24 MED ORDER — IBUPROFEN 800 MG PO TABS: 800.0000 mg | ORAL_TABLET | Freq: Three times a day (TID) | ORAL | 1 refills | Status: DC | PRN

## 2018-06-24 NOTE — Patient Instructions (Signed)
Stye    A stye, also known as a hordeolum, is a bump that forms on an eyelid. It may look like a pimple next to the eyelash. A stye can form inside the eyelid (internal stye) or outside the eyelid (external stye). A stye can cause redness, swelling, and pain on the eyelid.  Styes are very common. Anyone can get them at any age. They usually occur in just one eye, but you may have more than one in either eye.  What are the causes?  A stye is caused by an infection. The infection is almost always caused by bacteria called Staphylococcus aureus. This is a common type of bacteria that lives on the skin.  An internal stye may result from an infected oil-producing gland inside the eyelid. An external stye may be caused by an infection at the base of the eyelash (hair follicle).  What increases the risk?  You are more likely to develop a stye if:   You have had a stye before.   You have any of these conditions:  ? Diabetes.  ? Red, itchy, inflamed eyelids (blepharitis).  ? A skin condition such as seborrheic dermatitis or rosacea.  ? High fat levels in your blood (lipids).  What are the signs or symptoms?  The most common symptom of a stye is eyelid pain. Internal styes are more painful than external styes. Other symptoms may include:   Painful swelling of your eyelid.   A scratchy feeling in your eye.   Tearing and redness of your eye.   Pus draining from the stye.  How is this diagnosed?  Your health care provider may be able to diagnose a stye just by examining your eye. The health care provider may also check to make sure:   You do not have a fever or other signs of a more serious infection.   The infection has not spread to other parts of your eye or areas around your eye.  How is this treated?  Most styes will clear up in a few days without treatment or with warm compresses applied to the area. You may need to use antibiotic drops or ointment to treat an infection.  In some cases, if your stye does not heal  with routine treatment, your health care provider may drain pus from the stye using a thin blade or needle. This may be done if the stye is large, causing a lot of pain, or affecting your vision.  Follow these instructions at home:   Take over-the-counter and prescription medicines only as told by your health care provider. This includes eye drops or ointments.   If you were prescribed an antibiotic medicine, apply or use it as told by your health care provider. Do not stop using the antibiotic even if your condition improves.   Apply a warm, wet cloth (warm compress) to your eye for 5-10 minutes, 4 times a day.   Clean the affected eyelid as directed by your health care provider.   Do not wear contact lenses or eye makeup until your stye has healed.   Do not try to pop or drain the stye.   Do not rub your eye.  Contact a health care provider if:   You have chills or a fever.   Your stye does not go away after several days.   Your stye affects your vision.   Your eyeball becomes swollen, red, or painful.  Get help right away if:   You have pain   when moving your eye around.  Summary   A stye is a bump that forms on an eyelid. It may look like a pimple next to the eyelash.   A stye can form inside the eyelid (internal stye) or outside the eyelid (external stye). A stye can cause redness, swelling, and pain on the eyelid.   Your health care provider may be able to diagnose a stye just by examining your eye.   Apply a warm, wet cloth (warm compress) to your eye for 5-10 minutes, 4 times a day.  This information is not intended to replace advice given to you by your health care provider. Make sure you discuss any questions you have with your health care provider.  Document Released: 02/26/2005 Document Revised: 01/29/2017 Document Reviewed: 01/29/2017  [employer]vier Interactive Patient Education  2019 [employer]

## 2018-06-24 NOTE — Progress Notes (Signed)
   BP 137/90   Pulse 80   Temp (!) 97.3 F (36.3 C) (Oral)   Ht 5\' 2"  (1.575 m)   Wt 93.2 kg   BMI 37.57 kg/m    Subjective:    Patient ID: Amber Stephenson, female    DOB: February 13, 1946, 73 y.o.   MRN: [id number]  HPI: Amber Stephenson is a 73 y.o. female presenting on 06/24/2018 for Stye (right eye x 2 days)   HPI Patient is coming in with a stye on her right lower eyelid. She states it came up 2 days ago and has not gotten any better. It is not painful but she does experience some eye twitching and occasional blurry vision associated with the discharge, especially when she wakes up. She has not tried anything for relief.  She denies any pain in that eye and denies any redness in the eye itself but just more on the lower eyelid.  Patient has also been fighting a sinus headache and cold for the last 2 weeks. She is taking OTC medication and using Nasocort for relief. She is also requesting a refill on her 800mg  Motrin.  Relevant past medical, surgical, family and social history reviewed and updated as indicated. Interim medical history since our last visit reviewed. Allergies and medications reviewed and updated.  Review of Systems  Constitutional: Negative for chills and fever.  HENT: Positive for sinus pressure.   Eyes: Negative for photophobia, pain, discharge and itching. Eye redness: stye on left lower eyelid. Visual disturbance: blurry vision on and off.  Respiratory: Negative.   Cardiovascular: Negative.     Per HPI unless specifically indicated above     Objective:    BP 137/90   Pulse 80   Temp (!) 97.3 F (36.3 C) (Oral)   Ht 5\' 2"  (1.575 m)   Wt 93.2 kg   BMI 37.57 kg/m   Wt Readings from Last 3 Encounters:  06/24/18 93.2 kg  06/09/18 94.8 kg  05/13/18 93.4 kg    Physical Exam Constitutional:      General: She is not in acute distress. HENT:     Mouth/Throat:     Mouth: Mucous membranes are moist.     Pharynx: No posterior oropharyngeal erythema.    Eyes:     General:        Right eye: Hordeolum present. No foreign body or discharge.        Left eye: No foreign body or discharge.     Extraocular Movements: Extraocular movements intact.          Assessment & Plan:   Problem List Items Addressed This Visit    None    Visit Diagnoses    Hordeolum externum of right lower eyelid    -  Primary      Treat conservatively with warm compresses Follow up plan:  Use a warm compress 4-5 times a day. Return if symptoms worsen or fail to improve.  Counseling provided for all of the vaccine components No orders of the defined types were placed in this encounter.   [place], PA-S [facility] 06/24/2018, 11:51 AM Patient seen and examined with PA student, agree with assessment and plan above. Caryl Pina, MD [facility] 06/29/2018, 9:22 PM

## 2018-06-25 ENCOUNTER — Other Ambulatory Visit: Payer: Self-pay | Admitting: *Deleted

## 2018-06-25 ENCOUNTER — Ambulatory Visit: Payer: Self-pay | Admitting: Pulmonary Disease

## 2018-06-25 MED ORDER — GLUCOSE BLOOD VI STRP: ORAL_STRIP | 3 refills | Status: DC

## 2018-06-30 ENCOUNTER — Other Ambulatory Visit: Payer: Self-pay | Admitting: *Deleted

## 2018-07-01 ENCOUNTER — Ambulatory Visit: Payer: Medicare Other | Attending: Family Medicine | Admitting: Physical Therapy

## 2018-07-01 DIAGNOSIS — R0609 Other forms of dyspnea: Secondary | ICD-10-CM | POA: Insufficient documentation

## 2018-07-01 DIAGNOSIS — R2689 Other abnormalities of gait and mobility: Secondary | ICD-10-CM | POA: Diagnosis not present

## 2018-07-01 DIAGNOSIS — R0600 Dyspnea, unspecified: Secondary | ICD-10-CM

## 2018-07-02 ENCOUNTER — Encounter: Payer: Self-pay | Admitting: Physical Therapy

## 2018-07-02 ENCOUNTER — Other Ambulatory Visit: Payer: Self-pay

## 2018-07-02 NOTE — Therapy (Signed)
[facility] [address] [place], [place], [address] Phone: [phone]   Fax:  [phone]  Physical Therapy Evaluation  Patient Details  Name: Amber Stephenson MRN: [id number] Date of Birth: 16-Dec-1945 Referring Provider (PT): Ronnie Doss, DO   Encounter Date: 07/01/2018  PT End of Session - 07/02/18 1645    Visit Number  1    Number of Visits  4    Date for PT Re-Evaluation  08/02/18    Authorization Type  Medicare and Tricare    Authorization Time Period  07-01-18 - 09-15-18    PT Start Time  1315    PT Stop Time  1400    PT Time Calculation (min)  45 min       Past Medical History:  Diagnosis Date  . Atrial fibrillation ([place])   . Bipolar affective ([place])   . CHF (congestive heart failure) ([place])   . Chronic back pain   . Diabetes mellitus without complication ([place])   . Hyperlipidemia   . Hypertension   . Pain management   . Renal insufficiency     Past Surgical History:  Procedure Laterality Date  . BREAST REDUCTION SURGERY    . EYE SURGERY Right    cateracts  . HAMMER TOE SURGERY    . LEFT HEART CATH AND CORONARY ANGIOGRAPHY N/A 02/03/2018   Procedure: LEFT HEART CATH AND CORONARY ANGIOGRAPHY;  Surgeon: [place], Peter M, MD;  Location: [facility];  Service: Cardiovascular;  Laterality: N/A;  . SHOULDER SURGERY Right   . THIGH SURGERY      There were no vitals filed for this visit.   Subjective Assessment - 07/02/18 1418    Subjective  Pt reports weakness in legs which has been going on for approx. 2 years; states she is currently in PT for her left shoulder but is in process of being evaluated for possible Rt shoulder replacement; pt states she got sick approx. 5 yrs ago - was diagnosed with atrial fibrillation at that time and says she has gone down hill since that time    Patient is accompained by:  --   husband waiting in lobby   Pertinent History  Type 2 DM:  Left humeral fracture  due to fall: diabetic neuropathy; chronic back pain;  atrial fibrillation; pulmonary hypertension (per husband's report);  dyspnea and SOB; c/o severe Rt shoulder pain    Patient Stated Goals  "strengthen my core and my legs"- to be able to do housework easier         [facility] PT Assessment - 07/02/18 0001      Assessment   Medical Diagnosis  Physical deconditioning    Referring Provider (PT)  Ronnie Doss, DO    Onset Date/Surgical Date  --   Oct. 2019 for fall resulting in Lt humerus fracture     Restrictions   Weight Bearing Restrictions  No      Balance Screen   Has the patient fallen in the past 6 months  Yes    How many times?  2   most recent fall last week (slipped on bathroom floor)   Has the patient had a decrease in activity level because of a fear of falling?   Yes    Is the patient reluctant to leave their home because of a fear of falling?   Yes      [place] residence    Type of Home  House    Home Access  Stairs to enter    Entrance Stairs-Number of Steps  2    Entrance Stairs-Rails  None    Home Layout  Two level    Additional Comments  husband must help her negotiate the 2 steps to get into the house      Prior Function   Level of Independence  Independent with basic ADLs;Independent with household mobility without device;Independent with community mobility without device;Needs assistance with homemaking    Leisure  pt states she pedals in the morning      Transfers   Transfers  Sit to Stand    Comments  able to stand without UE support      Ambulation/Gait   Ambulation/Gait  Yes    Ambulation/Gait Assistance  5: Supervision    Ambulation Distance (Feet)  55 Feet   2 reps = 110' total distance   Assistive device  None    Gait Pattern  Within Functional Limits    Ambulation Surface  Level;Indoor    Gait velocity  10.91=  3.01 ft/sec    Gait Comments  pt's oxygen saturation rate 83% after amb. 55' 1st rep;  increased to 90% after approx. 30 sec seated rest period       Standardized Balance Assessment   Standardized Balance Assessment  Timed Up and Go Test      Timed Up and Go Test   Normal TUG (seconds)  11.44                Objective measurements completed on examination: See above findings.              PT Education - 07/02/18 1644    Education Details  recommended to pt that she walk as much as possible in her home    Person(s) Educated  Patient    Comprehension  Verbalized understanding          PT Long Term Goals - 07/02/18 1653      PT LONG TERM GOAL #1   Title  Independent with HEP including walking program.    Time  4    Period  Weeks    Status  New    Target Date  07/31/18      PT LONG TERM GOAL #2   Title  Referral to pulmonary rehab for close monitoring of oxygen saturation rates with exercise.    Time  4    Period  Weeks    Status  New    Target Date  07/31/18             Plan - 07/02/18 1646    Clinical Impression Statement  Pt is a 73 yr old lady with severe dyspnea with activity; pt's O2 saturation rate dropped to 83% after amb. 55'; O2 sat. rate increased to 90% after approx. 30 sec seated rest period.  Pt's strength is WFL's with dyspnea and SOB limiting activity tolerance.  Pt may beneift from pulmonary rehab to allow participation in cardiovascular conditioning activities with close monitoring of oxygen saturation rates with exercise.      History and Personal Factors relevant to plan of care:  atrial fibrillation, chronic low back pain, left femoral fracture due to fall    Clinical Presentation  Stable    Clinical Presentation due to:  CHF, atrial fibrillation, pulmonary hypertension (per pt report)    Clinical Decision Making  Low    Rehab Potential  Good    PT  Frequency  1x / week    PT Duration  4 weeks    PT Treatment/Interventions  ADLs/Self Care Home Management;Therapeutic activities;Therapeutic exercise;Balance  training;Neuromuscular re-education;Patient/family education    PT Next Visit Plan  HEP - walking program    PT Home Exercise Plan  walking program    Consulted and Agree with Plan of Care  Patient       Patient will benefit from skilled therapeutic intervention in order to improve the following deficits and impairments:  Decreased activity tolerance, Cardiopulmonary status limiting activity, Decreased endurance, Obesity, Other (comment)(DYSPNEA)  Visit Diagnosis: Other abnormalities of gait and mobility - Plan: PT plan of care cert/re-cert  Dyspnea on exertion - Plan: PT plan of care cert/re-cert     Problem List Patient Active Problem List   Diagnosis Date Noted  . Closed fracture of proximal end of left humerus 03/16/2018  . Left humeral fracture 03/06/2018  . Fracture of four ribs of left side, closed, initial encounter 03/06/2018  . Mild intermittent asthma without complication 35/68/6168  . Cardiomegaly 01/12/2018  . NAFL (nonalcoholic fatty liver) 37/29/0211  . Age-related osteoporosis without current pathological fracture 12/15/2017  . Asthma 11/12/2017  . Atrial fibrillation with RVR ([place]) 05/19/2017  . Microscopic colitis 05/19/2017  . Hypotension 05/19/2017  . Fibromyalgia 03/19/2017  . Healthcare maintenance 02/12/2016  . Migraine headache with aura 02/12/2016  . Diabetic neuropathy ([place]) 02/06/2016  . Depression   . Herpes genitalis in women 07/16/2015  . Chronic anticoagulation 05/30/2015  . Family history of coronary artery disease in sister 05/30/2015  . Chronic diastolic CHF (congestive heart failure) ([place]) 05/30/2015  . Chest pain with moderate risk of acute coronary syndrome 05/29/2015  . Essential hypertension   . RLS (restless legs syndrome) 04/27/2015  . Lipoma 02/08/2015  . Bipolar disorder ([place]) 01/23/2015  . Insomnia 01/23/2015  . Chronic back pain 01/04/2015  . Permanent atrial fibrillation 01/04/2015  . DM2 (diabetes mellitus, type 2) ([place])    . Hyperlipidemia     Nitika Jackowski, [place], PT 07/02/2018, 4:59 PM  [facility] [address] [place], [place], [address] Phone: [phone]   Fax:  [phone]  Name: Amber Stephenson MRN: [id number] Date of Birth: 27-Oct-1945

## 2018-07-06 ENCOUNTER — Ambulatory Visit (INDEPENDENT_AMBULATORY_CARE_PROVIDER_SITE_OTHER): Payer: Medicare Other | Admitting: Pulmonary Disease

## 2018-07-06 ENCOUNTER — Encounter: Payer: Self-pay | Admitting: Pulmonary Disease

## 2018-07-06 VITALS — BP 94/52 | HR 66 | Temp 98.3°F | Ht 62.0 in | Wt 205.8 lb

## 2018-07-06 DIAGNOSIS — E662 Morbid (severe) obesity with alveolar hypoventilation: Secondary | ICD-10-CM | POA: Insufficient documentation

## 2018-07-06 DIAGNOSIS — Z9189 Other specified personal risk factors, not elsewhere classified: Secondary | ICD-10-CM | POA: Diagnosis not present

## 2018-07-06 DIAGNOSIS — R0600 Dyspnea, unspecified: Secondary | ICD-10-CM

## 2018-07-06 DIAGNOSIS — G43109 Migraine with aura, not intractable, without status migrainosus: Secondary | ICD-10-CM | POA: Diagnosis not present

## 2018-07-06 DIAGNOSIS — E669 Obesity, unspecified: Secondary | ICD-10-CM | POA: Diagnosis not present

## 2018-07-06 DIAGNOSIS — J4520 Mild intermittent asthma, uncomplicated: Secondary | ICD-10-CM | POA: Diagnosis not present

## 2018-07-06 DIAGNOSIS — R0609 Other forms of dyspnea: Secondary | ICD-10-CM

## 2018-07-06 HISTORY — DX: Morbid (severe) obesity with alveolar hypoventilation: E66.2

## 2018-07-06 NOTE — Assessment & Plan Note (Signed)
Assessment: BMI 37.6 Elevated pulmonary pressures Restrictive lung disease on pulmonary function testing from August/2019  Plan: Referral to medical weight management

## 2018-07-06 NOTE — Progress Notes (Signed)
Revioewed, agree

## 2018-07-06 NOTE — Patient Instructions (Addendum)
Walk today in office to evaluate for hypoxia >>> Completed 2 laps walking 500 feet without desaturations  Referral to medical weight management    We will refer you to pulmonary rehab today per physical therapy's recommendations  Continue Symbicort 80 >>> 2 puffs in the morning right when you wake up, rinse out your mouth after use, 12 hours later 2 puffs, rinse after use >>> Take this daily, no matter what >>> This is not a rescue inhaler   Contact primary care today regarding your migraine and acute symptoms Keep follow-up with neurology  Follow-up with Dr. Lake Bells in 6 to 8 weeks  It is flu season:   >>>Remember to be washing your hands regularly, using hand sanitizer, be careful to use around herself with has contact with people who are sick will increase her chances of getting sick yourself. >>> Best ways to protect herself from the flu: Receive the yearly flu vaccine, practice good hand hygiene washing with soap and also using hand sanitizer when available, eat a nutritious meals, get adequate rest, hydrate appropriately   Please contact the office if your symptoms worsen or you have concerns that you are not improving.   Thank you for choosing [facility] for your healthcare, and for allowing [place] to partner with you on your healthcare journey. I am thankful to be able to provide care to you today.   Wyn Quaker FNP-C

## 2018-07-06 NOTE — Assessment & Plan Note (Signed)
Assessment: Patient walked for 500 feet on room air without desaturations Maintained well on Symbicort BMI 37.6  Plan: Referral to pulmonary rehab Referral to medical weight management Continue Symbicort 80 Keep scheduled follow-up with Dr. Lake Bells

## 2018-07-06 NOTE — Assessment & Plan Note (Signed)
Assessment: Lungs clear to auscultation today Patient reports maintained well on Symbicort 80  Plan: Continue Symbicort 80 twice daily Work on weight and wellness Increase exercise

## 2018-07-06 NOTE — Assessment & Plan Note (Signed)
Assessment: Patient currently in a migraine today Patient reports she is had a migraine for the last 10 days  Plan: Contact primary care for further evaluation Keep scheduled follow-up with neurology

## 2018-07-06 NOTE — Progress Notes (Signed)
@Patient  ID: Amber Stephenson, female    DOB: Oct 03, 1945, 73 y.o.   MRN: [id number]  Chief Complaint  Patient presents with  . Acute Visit    Shortness of breath    Referring provider: [name], DO  HPI:  73 year old female never smoker followed in our office for asthma  PMH: Bipolar, type 2 diabetes, hyperlipidemia, insomnia, atrial fibrillation (Eliquis) Smoker/ Smoking History: Never smoker Maintenance: Symbicort 80 Pt of: Dr. Lake Bells   07/06/2018  - Visit   73 year old female never smoker presenting to our office today for an acute visit.  Patient reports that she was previously working with physical therapy and she walked 55 feet and her oxygen saturations dropped to 83% on room air.  30 seconds into resting her oxygenation resumed to 90% on room air.  She wanted to be evaluated today to ensure there is nothing else going on causing her shortness of breath as well as her recent episode of hypoxia.  Patient reports adherence to her Symbicort 80 twice daily.  Patient reports no other new medications at this time.  Patient reports she does still have baseline dizziness today which is chronic for her she has vertigo.  Patient unfortunately is having a migraine today which she has been dealing with for the last 10 days.  Patient has not followed up with primary care or neurology regarding her migraine.  MMRC - Breathlessness Score 2 - on level ground, I walk slower than people of the same age because of breathlessness, or have to stop for breathe when walking to my own pace    Tests:   Chest imaging: May 2019 chest x-ray showed some basilar scarring versus atelectasis  PFT: August 2019 ratio normal, FVC 1.09 L 41% predicted, total lung capacity 3.59 L 78% predicted, DLCO 14.1 mL 69% predicted  Overnight oxygen testing: January 31, 2018 on room air: O2 saturation less than 88% for 25 minutes, O2 saturation nadir 82%  Labs: August 2019 sed rate 24, aldolase normal  4.9, ANA negative, anti-Jo 1-, centromere negative, CCP negative, rheumatoid factor negative, SSA negative, SSB negative, SCL 70 neg  Path:  Echo: September 2016 echocardiogram LVEF 55 to 60%, left atrium severely dilated, trivial mitral regurgitation, PA systolic pressure 25 mmHg September 2019 echocardiogram normal LVEF but RVSP was 46 mmHg  Heart Catheterization: February 03, 2018 left heart catheterization: Proximal LAD 40%, mid LAD 30%, first obtuse marginal 30% stenosed, LVEDP moderately elevated (27 mmHg) to  FENO:  No results found for: NITRICOXIDE  PFT: PFT Results Latest Ref Rng & Units 01/20/2018  FVC-Pre L 1.09  FVC-Predicted Pre % 41  Pre FEV1/FVC % % 83  FEV1-Pre L 0.90  FEV1-Predicted Pre % 45  DLCO UNC% % 69  DLCO COR %Predicted % 125  TLC L 3.59  TLC % Predicted % 78  RV % Predicted % 84    Imaging: No results found.    Specialty Problems      Pulmonary Problems   Dyspnea on exertion   Asthma   Mild intermittent asthma without complication      Allergies  Allergen Reactions  . Ivp Dye [Iodinated Diagnostic Agents] Anaphylaxis and Swelling    Throat closes    Immunization History  Administered Date(s) Administered  . Influenza, High Dose Seasonal PF 03/19/2017, 02/17/2018  . Influenza,inj,Quad PF,6+ Mos 03/30/2015, 03/17/2016  . Pneumococcal Conjugate-13 11/12/2017  . Pneumococcal Polysaccharide-23 05/30/2015  . Zoster 06/02/2012   Up-to-date  Past Medical History:  Diagnosis Date  .  Atrial fibrillation ([place])   . Bipolar affective ([place])   . CHF (congestive heart failure) ([place])   . Chronic back pain   . Diabetes mellitus without complication ([place])   . Hyperlipidemia   . Hypertension   . Morbid obesity due to excess calories ([place]) 07/06/2018  . Pain management   . Renal insufficiency     Tobacco History: Social History   Tobacco Use  Smoking Status Never Smoker  Smokeless Tobacco Never Used   Counseling given:  Yes  Continue to not smoke  Outpatient Encounter Medications as of 07/06/2018  Medication Sig  . albuterol (PROVENTIL HFA;VENTOLIN HFA) 108 (90 Base) MCG/ACT inhaler Inhale 1-2 puffs into the lungs every 6 (six) hours as needed for wheezing or shortness of breath.  . ARIPiprazole (ABILIFY) 10 MG tablet Take 3 tablets (30 mg total) by mouth daily.  Marland Kitchen atorvastatin (LIPITOR) 40 MG tablet TAKE 1 TABLET DAILY  . Blood Glucose Monitoring Suppl (FREESTYLE LITE) DEVI Use to check blood sugar tid. DX E11.22  . budesonide-formoterol (SYMBICORT) 80-4.5 MCG/ACT inhaler Inhale 2 puffs into the lungs 2 (two) times daily.  . Cholecalciferol (VITAMIN D3) 1000 UNITS CAPS Take 1,000 Units by mouth daily.  Marland Kitchen diltiazem (CARDIZEM CD) 120 MG 24 hr capsule Take 1 capsule (120 mg total) by mouth at bedtime.  . Dulaglutide (TRULICITY) 1.5 HC/6.2BJ SOPN Inject 1.5 mg into the skin once a week.  Marland Kitchen ELIQUIS 5 MG TABS tablet TAKE 1 TABLET TWICE A DAY (NEED TO BE SEEN BEFORE NEXT REFILL)  . Eszopiclone (ESZOPICLONE) 3 MG TABS Take 1 tablet (3 mg total) by mouth at bedtime as needed. Take immediately before bedtime  . furosemide (LASIX) 20 MG tablet Take 1 tablet (20 mg total) by mouth 2 (two) times daily.  Marland Kitchen glucose blood (FREESTYLE LITE) test strip USE FOR TESTING SUGAR THREE TIMES DAILY  . ibuprofen (ADVIL,MOTRIN) 800 MG tablet Take 1 tablet (800 mg total) by mouth every 8 (eight) hours as needed. Use sparingly with being on Eliquis  . Insulin Glargine (LANTUS SOLOSTAR) 100 UNIT/ML Solostar Pen INJECT 60 UNITS UNDER THE SKIN TWICE A DAY (Patient taking differently: Inject 60 Units into the skin 2 (two) times daily. )  . Insulin Pen Needle (SURE COMFORT PEN NEEDLES) 32G X 4 MM MISC Use with insulin pens as directed. Dx E11.9  . ketoconazole (NIZORAL) 2 % cream Apply to affected areas in groin region ONE time daily for 3-4 weeks.  . Lancets (FREESTYLE) lancets Test tid. DX E11.22  . LORazepam (ATIVAN) 1 MG tablet Take 1 tab  (1 mg) in the morning and 2 tabs (2 mg ) at bedtime  . methocarbamol (ROBAXIN) 500 MG tablet Take 1 tablet (500 mg total) by mouth every 8 (eight) hours as needed for muscle spasms.  . metoprolol tartrate (LOPRESSOR) 100 MG tablet Take 1 tablet (100 mg total) by mouth 2 (two) times daily. Take with metoprolol 25 mg to equal 125 mg  . metoprolol tartrate (LOPRESSOR) 25 MG tablet Take 1 tablet (25 mg total) by mouth 2 (two) times daily. Take with metoprolol 100 mg to equal 125 mg  . naloxone (NARCAN) nasal spray 4 mg/0.1 mL Place 1 spray into the nose once as needed (opiod overdose).  . pregabalin (LYRICA) 150 MG capsule Take 1 capsule (150 mg total) by mouth 2 (two) times daily.  . rizatriptan (MAXALT-MLT) 10 MG disintegrating tablet DISSOLVE UNDER TONGUE AT FIRST SIGN OF HEADACHE MAY REPEAT IN 1 HR AS NEEDED  .  traZODone (DESYREL) 100 MG tablet 4  qhs  . TRICOR 145 MG tablet TAKE 1 TABLET DAILY (NEED TO BE SEEN BEFORE NEXT REFILL)   No facility-administered encounter medications on file as of 07/06/2018.      Review of Systems  Review of Systems  Constitutional: Positive for fatigue. Negative for fever.  HENT: Negative for congestion, sinus pressure and sinus pain.   Eyes: Positive for visual disturbance (with migraine).  Respiratory: Positive for shortness of breath and wheezing (Occasionally at night). Negative for cough.        O2 levels dropped with pt to 83 percent   Cardiovascular: Negative for chest pain and palpitations.  Gastrointestinal: Negative for diarrhea, nausea and vomiting.  Neurological: Positive for dizziness (known vertigo chronically ) and headaches (migraine today - daily for last 10d).     Physical Exam  BP (!) 94/52 (BP Location: Left Arm, Cuff Size: Normal)   Pulse 66   Temp 98.3 F (36.8 C) (Oral)   Ht 5\' 2"  (1.575 m)   Wt 205 lb 12.8 oz (93.4 kg)   SpO2 94%   BMI 37.64 kg/m   Wt Readings from Last 5 Encounters:  07/06/18 205 lb 12.8 oz (93.4 kg)   06/24/18 205 lb 6.4 oz (93.2 kg)  06/09/18 209 lb (94.8 kg)  05/13/18 206 lb (93.4 kg)  05/03/18 209 lb (94.8 kg)    Physical Exam  Constitutional: She is oriented to person, place, and time and well-developed, well-nourished, and in no distress. No distress.  HENT:  Head: Normocephalic and atraumatic.  Right Ear: Hearing, tympanic membrane, external ear and ear canal normal.  Left Ear: Hearing, tympanic membrane, external ear and ear canal normal.  Nose: Nose normal. Right sinus exhibits no maxillary sinus tenderness and no frontal sinus tenderness. Left sinus exhibits no maxillary sinus tenderness and no frontal sinus tenderness.  Mouth/Throat: Uvula is midline and oropharynx is clear and moist. No oropharyngeal exudate.  Eyes: Pupils are equal, round, and reactive to light.  Neck: Normal range of motion. Neck supple.  Cardiovascular: Normal rate, regular rhythm and normal heart sounds.  Pulmonary/Chest: Effort normal and breath sounds normal. No accessory muscle usage. No respiratory distress. She has no decreased breath sounds. She has no wheezes. She has no rhonchi. She has no rales.  Musculoskeletal: Normal range of motion.        General: No edema.  Lymphadenopathy:    She has no cervical adenopathy.  Neurological: She is alert and oriented to person, place, and time. She has intact cranial nerves. She is not agitated. She displays facial symmetry, normal stance and normal speech. No cranial nerve deficit or sensory deficit. Gait normal. Coordination and gait normal. GCS score is 15.  Skin: Skin is warm and dry. She is not diaphoretic. No erythema.  Psychiatric: Mood, memory and judgment normal. She has a flat affect.  Nursing note and vitals reviewed.    SIX MIN WALK 07/06/2018 12/08/2017  Supplimental Oxygen during Test? (L/min) No No  Tech Comments: patient walked 2 laps, felt like her O2 sat was dropping but never went below 94%, patient was getting unsteady on her feet and  only completed 2 laps, walked at a slower to average pace -     Lab Results:  CBC    Component Value Date/Time   WBC 11.5 (H) 03/05/2018 2105   RBC 4.78 03/05/2018 2105   HGB 14.2 03/05/2018 2105   HGB 14.2 05/27/2017 1319   HCT 44.0 03/05/2018 2105  HCT 43.3 05/27/2017 1319   PLT 185 03/05/2018 2105   PLT 332 05/27/2017 1319   MCV 92.1 03/05/2018 2105   MCV 90 05/27/2017 1319   MCH 29.7 03/05/2018 2105   MCHC 32.3 03/05/2018 2105   RDW 13.0 03/05/2018 2105   RDW 14.7 05/27/2017 1319   LYMPHSABS 1.9 03/05/2018 2105   LYMPHSABS 4.7 (H) 05/27/2017 1319   MONOABS 0.7 03/05/2018 2105   EOSABS 0.1 03/05/2018 2105   EOSABS 0.1 05/27/2017 1319   BASOSABS 0.0 03/05/2018 2105   BASOSABS 0.0 05/27/2017 1319    BMET    Component Value Date/Time   NA 143 04/09/2018 1612   K 4.2 04/09/2018 1612   CL 102 04/09/2018 1612   CO2 28 04/09/2018 1612   GLUCOSE 220 (H) 04/09/2018 1612   GLUCOSE 489 (H) 03/06/2018 0955   BUN 15 04/09/2018 1612   CREATININE 0.98 04/09/2018 1612   CALCIUM 9.3 04/09/2018 1612   GFRNONAA 59 (L) 04/09/2018 1612   GFRAA 68 04/09/2018 1612    BNP    Component Value Date/Time   BNP 185.0 (H) 10/16/2017 1600    ProBNP No results found for: PROBNP    Assessment & Plan:    At risk for sleep apnea Assessment: BMI 37.6 Elevated pulmonary pressures on echocardiogram Mallampati 2 [place] today is 8 Patient reports previous failure with CPAP mask due to claustrophobia  Plan: Complete home sleep study as initially instructed at last office visit Could consider oral appliance therapy if home sleep study shows obstructive sleep apnea  Obesity (BMI 30-39.9) Assessment: BMI 37.6 Elevated pulmonary pressures Restrictive lung disease on pulmonary function testing from August/2019  Plan: Referral to medical weight management  Migraine headache with aura Assessment: Patient currently in a migraine today Patient reports she is had a migraine  for the last 10 days  Plan: Contact primary care for further evaluation Keep scheduled follow-up with neurology  Mild intermittent asthma without complication Assessment: Lungs clear to auscultation today Patient reports maintained well on Symbicort 80  Plan: Continue Symbicort 80 twice daily Work on weight and wellness Increase exercise   Dyspnea on exertion Assessment: Patient walked for 500 feet on room air without desaturations Maintained well on Symbicort BMI 37.6  Plan: Referral to pulmonary rehab Referral to medical weight management Continue Symbicort 80 Keep scheduled follow-up with Dr. Eber Hong, NP 07/06/2018   This appointment was 30 min long with over 50% of the time in direct face-to-face patient care, assessment, plan of care, and follow-up.

## 2018-07-06 NOTE — Assessment & Plan Note (Addendum)
Assessment: BMI 37.6 Elevated pulmonary pressures on echocardiogram Mallampati 2 [place] today is 8 Patient reports previous failure with CPAP mask due to claustrophobia  Plan: Complete home sleep study as initially instructed at last office visit Could consider oral appliance therapy if home sleep study shows obstructive sleep apnea

## 2018-07-07 ENCOUNTER — Encounter: Payer: Self-pay | Admitting: Physician Assistant

## 2018-07-07 ENCOUNTER — Ambulatory Visit (INDEPENDENT_AMBULATORY_CARE_PROVIDER_SITE_OTHER): Payer: Medicare Other | Admitting: Physician Assistant

## 2018-07-07 VITALS — BP 112/67 | HR 68 | Temp 96.8°F | Ht 62.0 in | Wt 203.2 lb

## 2018-07-07 DIAGNOSIS — G43719 Chronic migraine without aura, intractable, without status migrainosus: Secondary | ICD-10-CM

## 2018-07-07 MED ORDER — PROMETHAZINE HCL 25 MG/ML IJ SOLN
25.0000 mg | Freq: Once | INTRAMUSCULAR | Status: AC
  Administered 2018-07-07: 25 mg via INTRAMUSCULAR

## 2018-07-07 MED ORDER — PROMETHAZINE HCL 25 MG PO TABS: 25.0000 mg | ORAL_TABLET | Freq: Three times a day (TID) | ORAL | 0 refills | Status: DC | PRN

## 2018-07-07 MED ORDER — METHYLPREDNISOLONE ACETATE 80 MG/ML IJ SUSP
80.0000 mg | Freq: Once | INTRAMUSCULAR | Status: AC
  Administered 2018-07-07: 80 mg via INTRAMUSCULAR

## 2018-07-07 NOTE — Progress Notes (Signed)
BP 112/67   Pulse 68   Temp (!) 96.8 F (36 C) (Oral)   Ht _0  (1.575 m)   Wt 203 lb 3.2 oz (92.2 kg)   BMI 37.17 kg/m    Subjective:    Patient ID: Amber Stephenson, female    DOB: 04-04-1946, 73 y.o.   MRN: [id number]  HPI: Amber Stephenson is a 73 y.o. female presenting on 07/07/2018 for Migraine (x 10 days)  This patient comes in for treatment of migraine.  She has longstanding history of migraines and has used Maxalt and Imitrex in the past.  These at this been helping her whatsoever.  It would relieve some pain but not significantly.  She does have Maxalt listed at this time.  Has any recent cough cold, fever or congestion.  She has been able to eat.  She states she has had some nausea but she is not vomiting.  She reports that this 1 has going on off and on for 10 days now.  Past Medical History:  Diagnosis Date  . Atrial fibrillation ([place])   . Bipolar affective ([place])   . CHF (congestive heart failure) ([place])   . Chronic back pain   . Diabetes mellitus without complication ([place])   . Hyperlipidemia   . Hypertension   . Morbid obesity due to excess calories ([place]) 07/06/2018  . Pain management   . Renal insufficiency    Relevant past medical, surgical, family and social history reviewed and updated as indicated. Interim medical history since our last visit reviewed. Allergies and medications reviewed and updated. DATA REVIEWED: CHART IN EPIC  Family History reviewed for pertinent findings.  Review of Systems  Constitutional: Positive for fatigue. Negative for activity change and fever.  HENT: Negative.   Eyes: Negative.   Respiratory: Negative.  Negative for cough.   Cardiovascular: Negative.  Negative for chest pain.  Gastrointestinal: Positive for nausea. Negative for abdominal pain and vomiting.  Endocrine: Negative.   Genitourinary: Negative.  Negative for dysuria.  Musculoskeletal: Negative.   Skin: Negative.   Neurological: Positive for headaches. Negative  for tremors, syncope, weakness and numbness.    Allergies as of 07/07/2018      Reactions   Ivp Dye [iodinated Diagnostic Agents] Anaphylaxis, Swelling   Throat closes      Medication List       Accurate as of July 07, 2018 11:03 AM. Always use your most recent med list.        albuterol 108 (90 Base) MCG/ACT inhaler Commonly known as:  PROVENTIL HFA;VENTOLIN HFA Inhale 1-2 puffs into the lungs every 6 (six) hours as needed for wheezing or shortness of breath.   ARIPiprazole 10 MG tablet Commonly known as:  ABILIFY Take 3 tablets (30 mg total) by mouth daily.   atorvastatin 40 MG tablet Commonly known as:  LIPITOR TAKE 1 TABLET DAILY   budesonide-formoterol 80-4.5 MCG/ACT inhaler Commonly known as:  SYMBICORT Inhale 2 puffs into the lungs 2 (two) times daily.   diltiazem 120 MG 24 hr capsule Commonly known as:  CARDIZEM CD Take 1 capsule (120 mg total) by mouth at bedtime.   Dulaglutide 1.5 MG/0.5ML Sopn Commonly known as:  TRULICITY Inject 1.5 mg into the skin once a week.   ELIQUIS 5 MG Tabs tablet Generic drug:  apixaban TAKE 1 TABLET TWICE A DAY (NEED TO BE SEEN BEFORE NEXT REFILL)   Eszopiclone 3 MG Tabs Commonly known as:  eszopiclone Take 1 tablet (3 mg total)  by mouth at bedtime as needed. Take immediately before bedtime   freestyle lancets Test tid. DX E11.22   FREESTYLE LITE Devi Use to check blood sugar tid. DX E11.22   furosemide 20 MG tablet Commonly known as:  LASIX Take 1 tablet (20 mg total) by mouth 2 (two) times daily.   glucose blood test strip Commonly known as:  FREESTYLE LITE USE FOR TESTING SUGAR THREE TIMES DAILY   ibuprofen 800 MG tablet Commonly known as:  ADVIL,MOTRIN Take 1 tablet (800 mg total) by mouth every 8 (eight) hours as needed. Use sparingly with being on Eliquis   Insulin Glargine 100 UNIT/ML Solostar Pen Commonly known as:  LANTUS SOLOSTAR INJECT 60 UNITS UNDER THE SKIN TWICE A DAY   Insulin Pen Needle 32G X  4 MM Misc Commonly known as:  SURE COMFORT PEN NEEDLES Use with insulin pens as directed. Dx E11.9   ketoconazole 2 % cream Commonly known as:  NIZORAL Apply to affected areas in groin region ONE time daily for 3-4 weeks.   LORazepam 1 MG tablet Commonly known as:  ATIVAN Take 1 tab (1 mg) in the morning and 2 tabs (2 mg ) at bedtime   methocarbamol 500 MG tablet Commonly known as:  ROBAXIN Take 1 tablet (500 mg total) by mouth every 8 (eight) hours as needed for muscle spasms.   metoprolol tartrate 25 MG tablet Commonly known as:  LOPRESSOR Take 1 tablet (25 mg total) by mouth 2 (two) times daily. Take with metoprolol 100 mg to equal 125 mg   metoprolol tartrate 100 MG tablet Commonly known as:  LOPRESSOR Take 1 tablet (100 mg total) by mouth 2 (two) times daily. Take with metoprolol 25 mg to equal 125 mg   NARCAN 4 MG/0.1ML Liqd nasal spray kit Generic drug:  naloxone Place 1 spray into the nose once as needed (opiod overdose).   pregabalin 150 MG capsule Commonly known as:  LYRICA Take 1 capsule (150 mg total) by mouth 2 (two) times daily.   promethazine 25 MG tablet Commonly known as:  PHENERGAN Take 1 tablet (25 mg total) by mouth every 8 (eight) hours as needed for nausea or vomiting.   rizatriptan 10 MG disintegrating tablet Commonly known as:  MAXALT-MLT DISSOLVE UNDER TONGUE AT FIRST SIGN OF HEADACHE MAY REPEAT IN 1 HR AS NEEDED   traZODone 100 MG tablet Commonly known as:  DESYREL 4  qhs   TRICOR 145 MG tablet Generic drug:  fenofibrate TAKE 1 TABLET DAILY (NEED TO BE SEEN BEFORE NEXT REFILL)   Vitamin D3 25 MCG (1000 UT) Caps Take 1,000 Units by mouth daily.          Objective:    BP 112/67   Pulse 68   Temp (!) 96.8 F (36 C) (Oral)   Ht '5\' 2"'$  (1.575 m)   Wt 203 lb 3.2 oz (92.2 kg)   BMI 37.17 kg/m   Allergies  Allergen Reactions  . Ivp Dye [Iodinated Diagnostic Agents] Anaphylaxis and Swelling    Throat closes    Wt Readings from  Last 3 Encounters:  07/07/18 203 lb 3.2 oz (92.2 kg)  07/06/18 205 lb 12.8 oz (93.4 kg)  06/24/18 205 lb 6.4 oz (93.2 kg)    Physical Exam Constitutional:      Appearance: She is well-developed.  HENT:     Head: Normocephalic and atraumatic.     Right Ear: Tympanic membrane, ear canal and external ear normal.     Left  Ear: Tympanic membrane, ear canal and external ear normal.     Nose: Nose normal. No rhinorrhea.     Mouth/Throat:     Pharynx: No oropharyngeal exudate or posterior oropharyngeal erythema.  Eyes:     Conjunctiva/sclera: Conjunctivae normal.     Pupils: Pupils are equal, round, and reactive to light.  Neck:     Musculoskeletal: Normal range of motion and neck supple.  Cardiovascular:     Rate and Rhythm: Normal rate and regular rhythm.     Heart sounds: Normal heart sounds.  Pulmonary:     Effort: Pulmonary effort is normal.     Breath sounds: Normal breath sounds.  Abdominal:     General: Bowel sounds are normal.     Palpations: Abdomen is soft.  Skin:    General: Skin is warm and dry.     Findings: No rash.  Neurological:     General: No focal deficit present.     Mental Status: She is alert and oriented to person, place, and time.     Cranial Nerves: Cranial nerves are intact.     Motor: Motor function is intact.     Coordination: Coordination is intact.     Deep Tendon Reflexes: Reflexes are normal and symmetric.  Psychiatric:        Behavior: Behavior normal.        Thought Content: Thought content normal.        Judgment: Judgment normal.         Assessment & Plan:   1. Intractable chronic migraine without aura and without status migrainosus - methylPREDNISolone acetate (DEPO-MEDROL) injection 80 mg - promethazine (PHENERGAN) 25 MG tablet; Take 1 tablet (25 mg total) by mouth every 8 (eight) hours as needed for nausea or vomiting.  Dispense: 20 tablet; Refill: 0   Continue all other maintenance medications as listed above.  Follow up  plan: No follow-ups on file.  Educational handout given for migraine  Terald Sleeper PA-C [facility] [address]  [place], [place] [address] [phone]   07/07/2018, 11:03 AM

## 2018-07-07 NOTE — Addendum Note (Signed)
Addended by: Thana Ates on: 07/07/2018 11:17 AM   Modules accepted: Orders

## 2018-07-07 NOTE — Patient Instructions (Signed)
Migraine Headache  A migraine headache is a very strong throbbing pain on one side or both sides of your head. Migraines can also cause other symptoms. Talk with your doctor about what things may bring on (trigger) your migraine headaches. Follow these instructions at home: Medicines  Take over-the-counter and prescription medicines only as told by your doctor.  Do not drive or use heavy machinery while taking prescription pain medicine.  To prevent or treat constipation while you are taking prescription pain medicine, your doctor may recommend that you: ? Drink enough fluid to keep your pee (urine) clear or pale yellow. ? Take over-the-counter or prescription medicines. ? Eat foods that are high in fiber. These include fresh fruits and vegetables, whole grains, and beans. ? Limit foods that are high in fat and processed sugars. These include fried and sweet foods. Lifestyle  Avoid alcohol.  Do not use any products that contain nicotine or tobacco, such as cigarettes and e-cigarettes. If you need help quitting, ask your doctor.  Get at least 8 hours of sleep every night.  Limit your stress. General instructions   Keep a journal to find out what may bring on your migraines. For example, write down: ? What you eat and drink. ? How much sleep you get. ? Any change in what you eat or drink. ? Any change in your medicines.  If you have a migraine: ? Avoid things that make your symptoms worse, such as bright lights. ? It may help to lie down in a dark, quiet room. ? Do not drive or use heavy machinery. ? Ask your doctor what activities are safe for you.  Keep all follow-up visits as told by your doctor. This is important. Contact a doctor if:  You get a migraine that is different or worse than your usual migraines. Get help right away if:  Your migraine gets very bad.  You have a fever.  You have a stiff neck.  You have trouble seeing.  Your muscles feel weak or like  you cannot control them.  You start to lose your balance a lot.  You start to have trouble walking.  You pass out (faint). This information is not intended to replace advice given to you by your health care provider. Make sure you discuss any questions you have with your health care provider. Document Released: 02/26/2008 Document Revised: 02/10/2018 Document Reviewed: 11/05/2015 Elsevier Interactive Patient Education  2019 [employer]  

## 2018-07-08 ENCOUNTER — Telehealth: Payer: Self-pay | Admitting: *Deleted

## 2018-07-08 DIAGNOSIS — S42202D Unspecified fracture of upper end of left humerus, subsequent encounter for fracture with routine healing: Secondary | ICD-10-CM | POA: Diagnosis not present

## 2018-07-09 ENCOUNTER — Ambulatory Visit: Payer: Medicare Other

## 2018-07-12 ENCOUNTER — Other Ambulatory Visit (HOSPITAL_COMMUNITY): Payer: Self-pay

## 2018-07-12 DIAGNOSIS — M419 Scoliosis, unspecified: Secondary | ICD-10-CM | POA: Diagnosis not present

## 2018-07-12 DIAGNOSIS — Z79899 Other long term (current) drug therapy: Secondary | ICD-10-CM | POA: Diagnosis not present

## 2018-07-12 DIAGNOSIS — M5136 Other intervertebral disc degeneration, lumbar region: Secondary | ICD-10-CM | POA: Diagnosis not present

## 2018-07-12 DIAGNOSIS — M545 Low back pain: Secondary | ICD-10-CM | POA: Diagnosis not present

## 2018-07-12 MED ORDER — PREGABALIN 150 MG PO CAPS: 150.0000 mg | ORAL_CAPSULE | Freq: Two times a day (BID) | ORAL | 2 refills | Status: DC

## 2018-07-13 DIAGNOSIS — M25511 Pain in right shoulder: Secondary | ICD-10-CM | POA: Diagnosis not present

## 2018-07-15 ENCOUNTER — Ambulatory Visit (INDEPENDENT_AMBULATORY_CARE_PROVIDER_SITE_OTHER): Payer: Medicare Other | Admitting: *Deleted

## 2018-07-15 DIAGNOSIS — M810 Age-related osteoporosis without current pathological fracture: Secondary | ICD-10-CM

## 2018-07-15 MED ORDER — DENOSUMAB 60 MG/ML ~~LOC~~ SOSY
60.0000 mg | PREFILLED_SYRINGE | Freq: Once | SUBCUTANEOUS | Status: AC
  Administered 2018-07-15: 60 mg via SUBCUTANEOUS

## 2018-07-15 NOTE — Addendum Note (Signed)
Addended by: Marin Olp on: 07/15/2018 04:49 PM   Modules accepted: Orders

## 2018-07-15 NOTE — Progress Notes (Signed)
Pt given Prolia inj Buy and bill Tolerated well 

## 2018-07-16 NOTE — Telephone Encounter (Signed)
Pt scheduled for inj

## 2018-07-19 ENCOUNTER — Telehealth: Payer: Self-pay | Admitting: Family Medicine

## 2018-07-19 ENCOUNTER — Other Ambulatory Visit: Payer: Self-pay | Admitting: Family Medicine

## 2018-07-19 MED ORDER — ALBUTEROL SULFATE (2.5 MG/3ML) 0.083% IN NEBU: 2.5000 mg | INHALATION_SOLUTION | Freq: Four times a day (QID) | RESPIRATORY_TRACT | 1 refills | Status: DC | PRN

## 2018-07-19 NOTE — Telephone Encounter (Signed)
Done

## 2018-07-19 NOTE — Telephone Encounter (Signed)
Called pt Send to [facility]  Not on med list - needs NEB albuterol

## 2018-07-20 ENCOUNTER — Ambulatory Visit (INDEPENDENT_AMBULATORY_CARE_PROVIDER_SITE_OTHER): Payer: Medicare Other | Admitting: Family Medicine

## 2018-07-20 ENCOUNTER — Encounter: Payer: Self-pay | Admitting: Family Medicine

## 2018-07-20 VITALS — BP 150/94 | HR 74 | Temp 97.1°F | Ht 62.0 in | Wt 208.0 lb

## 2018-07-20 DIAGNOSIS — G43119 Migraine with aura, intractable, without status migrainosus: Secondary | ICD-10-CM | POA: Diagnosis not present

## 2018-07-20 MED ORDER — METHYLPREDNISOLONE ACETATE 40 MG/ML IJ SUSP
40.0000 mg | Freq: Once | INTRAMUSCULAR | Status: AC
  Administered 2018-07-20: 40 mg via INTRAMUSCULAR

## 2018-07-20 MED ORDER — PROMETHAZINE HCL 25 MG/ML IJ SOLN
6.2500 mg | Freq: Once | INTRAMUSCULAR | Status: AC
  Administered 2018-07-20: 6.25 mg via INTRAMUSCULAR

## 2018-07-20 NOTE — Progress Notes (Signed)
Subjective: CC: f/u Migraines PCP: Amber Norlander, DO HAL:[id number] Amber Stephenson is a 73 y.o. female presenting to clinic today for:  1. Migraine headaches Amber Stephenson comes in today to follow-up on migraine headaches.  She was treated about 2 weeks ago for severe migraine headache with Depo-Medrol 80.  She was also given a prescription for promethazine but notes that she has not use the medicine because she was unaware as to what it was for.  She notes chronic history of migraine headaches, having about 15 migraine headaches per month.  She is on a beta-blocker for known cardiovascular disease but no other preventative treatments for migraine headache.  Additionally, she is prescribed Maxalt and notes that she uses this fairly regularly.  She has a neurologist and has a plan to see her soon for further discussion about preventable migraine headaches.  She specifically is considering the injection.  Currently, she reports photophobia.  She does note that she had an aura prior to onset of migraine headache that she describes as scalp tingling on the left side with associated spots in vision.  No nausea, vomiting, falls or other neurologic changes.  She has taken 2 doses of the Maxalt today.   ROS: Per HPI  Allergies  Allergen Reactions  . Ivp Dye [Iodinated Diagnostic Agents] Anaphylaxis and Swelling    Throat closes   Past Medical History:  Diagnosis Date  . Atrial fibrillation ([place])   . Bipolar affective ([place])   . CHF (congestive heart failure) ([place])   . Chronic back pain   . Diabetes mellitus without complication ([place])   . Hyperlipidemia   . Hypertension   . Morbid obesity due to excess calories ([place]) 07/06/2018  . Pain management   . Renal insufficiency     Current Outpatient Medications:  .  albuterol (PROVENTIL) (2.5 MG/3ML) 0.083% nebulizer solution, Take 3 mLs (2.5 mg total) by nebulization every 6 (six) hours as needed for wheezing or shortness of breath., Disp: 75 mL, Rfl:  1 .  ARIPiprazole (ABILIFY) 10 MG tablet, Take 3 tablets (30 mg total) by mouth daily., Disp: 90 tablet, Rfl: 4 .  atorvastatin (LIPITOR) 40 MG tablet, TAKE 1 TABLET DAILY, Disp: 90 tablet, Rfl: 1 .  Blood Glucose Monitoring Suppl (FREESTYLE LITE) DEVI, Use to check blood sugar tid. DX E11.22, Disp: 1 each, Rfl: 0 .  budesonide-formoterol (SYMBICORT) 80-4.5 MCG/ACT inhaler, Inhale 2 puffs into the lungs 2 (two) times daily., Disp: 2 Inhaler, Rfl: 0 .  Cholecalciferol (VITAMIN D3) 1000 UNITS CAPS, Take 1,000 Units by mouth daily., Disp: , Rfl:  .  diltiazem (CARDIZEM CD) 120 MG 24 hr capsule, Take 1 capsule (120 mg total) by mouth at bedtime., Disp: 90 capsule, Rfl: 3 .  Dulaglutide (TRULICITY) 1.5 IO/9.7DZ SOPN, Inject 1.5 mg into the skin once a week., Disp: 12 pen, Rfl: 3 .  ELIQUIS 5 MG TABS tablet, TAKE 1 TABLET TWICE A DAY (NEED TO BE SEEN BEFORE NEXT REFILL), Disp: 180 tablet, Rfl: 1 .  Eszopiclone (ESZOPICLONE) 3 MG TABS, Take 1 tablet (3 mg total) by mouth at bedtime as needed. Take immediately before bedtime, Disp: 30 tablet, Rfl: 4 .  furosemide (LASIX) 20 MG tablet, Take 1 tablet (20 mg total) by mouth 2 (two) times daily., Disp: 180 tablet, Rfl: 1 .  glucose blood (FREESTYLE LITE) test strip, USE FOR TESTING SUGAR THREE TIMES DAILY, Disp: 300 each, Rfl: 3 .  ibuprofen (ADVIL,MOTRIN) 800 MG tablet, Take 1 tablet (800 mg total)  by mouth every 8 (eight) hours as needed. Use sparingly with being on Eliquis, Disp: 30 tablet, Rfl: 1 .  Insulin Glargine (LANTUS SOLOSTAR) 100 UNIT/ML Solostar Pen, INJECT 60 UNITS UNDER THE SKIN TWICE A DAY (Patient taking differently: Inject 60 Units into the skin 2 (two) times daily. ), Disp: 90 mL, Rfl: 0 .  Insulin Pen Needle (SURE COMFORT PEN NEEDLES) 32G X 4 MM MISC, Use with insulin pens as directed. Dx E11.9, Disp: 300 each, Rfl: 11 .  ketoconazole (NIZORAL) 2 % cream, Apply to affected areas in groin region ONE time daily for 3-4 weeks., Disp: 60 g, Rfl:  1 .  Lancets (FREESTYLE) lancets, Test tid. DX E11.22, Disp: 100 each, Rfl: 5 .  LORazepam (ATIVAN) 1 MG tablet, Take 1 tab (1 mg) in the morning and 2 tabs (2 mg ) at bedtime, Disp: 90 tablet, Rfl: 4 .  methocarbamol (ROBAXIN) 500 MG tablet, Take 1 tablet (500 mg total) by mouth every 8 (eight) hours as needed for muscle spasms., Disp: 15 tablet, Rfl: 0 .  metoprolol tartrate (LOPRESSOR) 100 MG tablet, Take 1 tablet (100 mg total) by mouth 2 (two) times daily. Take with metoprolol 25 mg to equal 125 mg, Disp: 28 tablet, Rfl: 0 .  metoprolol tartrate (LOPRESSOR) 25 MG tablet, Take 1 tablet (25 mg total) by mouth 2 (two) times daily. Take with metoprolol 100 mg to equal 125 mg, Disp: 28 tablet, Rfl: 0 .  naloxone (NARCAN) nasal spray 4 mg/0.1 mL, Place 1 spray into the nose once as needed (opiod overdose)., Disp: , Rfl:  .  pregabalin (LYRICA) 150 MG capsule, Take 1 capsule (150 mg total) by mouth 2 (two) times daily., Disp: 180 capsule, Rfl: 2 .  promethazine (PHENERGAN) 25 MG tablet, Take 1 tablet (25 mg total) by mouth every 8 (eight) hours as needed for nausea or vomiting., Disp: 20 tablet, Rfl: 0 .  rizatriptan (MAXALT-MLT) 10 MG disintegrating tablet, DISSOLVE UNDER TONGUE AT FIRST SIGN OF HEADACHE MAY REPEAT IN 1 HR AS NEEDED, Disp: 10 tablet, Rfl: 2 .  traZODone (DESYREL) 100 MG tablet, 4  qhs, Disp: 120 tablet, Rfl: 4 .  TRICOR 145 MG tablet, TAKE 1 TABLET DAILY (NEED TO BE SEEN BEFORE NEXT REFILL), Disp: 90 tablet, Rfl: 3 Social History   Socioeconomic History  . Marital status: Married    Spouse name: Not on file  . Number of children: 3  . Years of education: 16  . Highest education level: Bachelor's degree (e.g., BA, AB, BS)  Occupational History  . Not on file  Social Needs  . Financial resource strain: Not hard at all  . Food insecurity:    Worry: Never true    Inability: Never true  . Transportation needs:    Medical: No    Non-medical: No  Tobacco Use  . Smoking status:  Never Smoker  . Smokeless tobacco: Never Used  Substance and Sexual Activity  . Alcohol use: No    Alcohol/week: 0.0 standard drinks  . Drug use: No  . Sexual activity: Yes    Partners: Male    Birth control/protection: Post-menopausal  Lifestyle  . Physical activity:    Days per week: 0 days    Minutes per session: 0 min  . Stress: Not at all  Relationships  . Social connections:    Talks on phone: More than three times a week    Gets together: Once a week    Attends religious service: More than  4 times per year    Active member of club or organization: No    Attends meetings of clubs or organizations: Never    Relationship status: Married  . Intimate partner violence:    Fear of current or ex partner: No    Emotionally abused: No    Physically abused: No    Forced sexual activity: No  Other Topics Concern  . Not on file  Social History Narrative   Lives at home with husband.    Family History  Problem Relation Age of Onset  . Diabetes Mother   . Heart disease Mother   . Heart disease Brother   . Heart disease Sister        CABG  . Alcohol abuse Sister   . Mental illness Brother   . Diabetes Brother     Objective: Office vital signs reviewed. BP (!) 150/94   Pulse 74   Temp (!) 97.1 F (36.2 C) (Oral)   Ht 5\' 2"  (1.575 m)   Wt 208 lb (94.3 kg)   BMI 38.04 kg/m   Physical Examination:  General: Awake, alert, obese, No acute distress HEENT: Normal, sclera white, MMM, PERLA Cardio: regular rate and rhythm, S1S2 heard Pulm: clear to auscultation bilaterally, no wheezes, rhonchi or rales; normal work of breathing on room air Neuro: CN 2-12 grossly in tact. AOx3.  Assessment/ Plan: 73 y.o. female   1. Intractable migraine with aura without status migrainosus No focal neurologic deficits on exam.  She is status post 2 doses of triptan today.  I have given her a dose of Depo-Medrol in addition to promethazine injectable.  Him care instructions reviewed and  reasons for emergent evaluation emergency department discussed.  Advised her to keep follow-up with neurology to discuss alternative/ adjunctive medication for migraine headaches. - methylPREDNISolone acetate (DEPO-MEDROL) injection 40 mg - promethazine (PHENERGAN) injection 6.25 mg   No orders of the defined types were placed in this encounter.  Meds ordered this encounter  Medications  . methylPREDNISolone acetate (DEPO-MEDROL) injection 40 mg  . promethazine (PHENERGAN) injection 6.25 mg     Amber Norlander, DO [facility] [phone]

## 2018-07-20 NOTE — Patient Instructions (Addendum)
Follow up with neurology for preventative migraine options.  You were given a dose of Promethazine and Depomedrol injection for the headache today.  If this continues or gets worse, please go directly to the emergency department.

## 2018-07-27 ENCOUNTER — Telehealth (HOSPITAL_COMMUNITY): Payer: Self-pay

## 2018-07-27 DIAGNOSIS — Z79899 Other long term (current) drug therapy: Secondary | ICD-10-CM | POA: Diagnosis not present

## 2018-07-27 DIAGNOSIS — D509 Iron deficiency anemia, unspecified: Secondary | ICD-10-CM | POA: Diagnosis not present

## 2018-07-27 DIAGNOSIS — Z1159 Encounter for screening for other viral diseases: Secondary | ICD-10-CM | POA: Diagnosis not present

## 2018-07-27 DIAGNOSIS — R809 Proteinuria, unspecified: Secondary | ICD-10-CM | POA: Diagnosis not present

## 2018-07-27 DIAGNOSIS — N183 Chronic kidney disease, stage 3 (moderate): Secondary | ICD-10-CM | POA: Diagnosis not present

## 2018-07-27 DIAGNOSIS — E559 Vitamin D deficiency, unspecified: Secondary | ICD-10-CM | POA: Diagnosis not present

## 2018-07-27 DIAGNOSIS — I10 Essential (primary) hypertension: Secondary | ICD-10-CM | POA: Diagnosis not present

## 2018-07-27 NOTE — Telephone Encounter (Signed)
Patient is calling because the Lyrica never went to the pharmacy, it was printed. Patient is out, I called in a one month supply to her local pharmacy, but [facility] would not let me call in Lyrica, they said it has to me e-scribed or faxed. Please resend patients Lyrica to the pharmacy, thank you

## 2018-07-28 ENCOUNTER — Other Ambulatory Visit (HOSPITAL_COMMUNITY): Payer: Self-pay | Admitting: Psychiatry

## 2018-07-28 ENCOUNTER — Ambulatory Visit: Payer: Medicare Other | Attending: Family Medicine | Admitting: Physical Therapy

## 2018-07-28 VITALS — HR 70

## 2018-07-28 DIAGNOSIS — R2689 Other abnormalities of gait and mobility: Secondary | ICD-10-CM

## 2018-07-28 MED ORDER — PREGABALIN 150 MG PO CAPS: 150.0000 mg | ORAL_CAPSULE | Freq: Two times a day (BID) | ORAL | 4 refills | Status: DC

## 2018-07-28 NOTE — Telephone Encounter (Signed)
Called it in as requested

## 2018-07-28 NOTE — Patient Instructions (Addendum)
Bridging    Lying supine with knees bent, tighten stomach muscles. Raise hips off floor, tighten buttocks. Hold _3__ seconds. Repeat _10__ times. Do _1__ times per day.    Hip Flexion / Knee Extension: Straight-Leg Raise (Eccentric)    Lie on back. Lift leg with knee straight. Slowly lower leg for 3-5 seconds. _10__ reps per set, _1__ sets per day, __5_ days per week.  Lift to height of bent leg if possible.HIP: Abduction - Side-Lying    Lie on side, legs straight and in line with trunk. Squeeze glutes. Raise top leg up and slightly back. Point toes forward. _10__ reps per set, _1_ sets per day, _5__ days per week Bend bottom leg to stabilize pelvis.   KEEP BOTTOM LEG BENT!!

## 2018-07-29 ENCOUNTER — Encounter: Payer: Self-pay | Admitting: Physical Therapy

## 2018-07-29 DIAGNOSIS — B353 Tinea pedis: Secondary | ICD-10-CM | POA: Diagnosis not present

## 2018-07-29 NOTE — Therapy (Signed)
[facility] [address] [place], [place], [address] Phone: [phone]   Fax:  [phone]  Physical Therapy Treatment  Patient Details  Name: Amber Stephenson MRN: [id number] Date of Birth: September 05, 1945 Referring Provider (PT): Ronnie Doss, DO   Encounter Date: 07/28/2018  PT End of Session - 07/29/18 1648    Visit Number  2    Number of Visits  4    Date for PT Re-Evaluation  08/02/18    Authorization Type  Medicare and T[employer]are    Authorization Time Period  07-01-18 - 09-15-18    PT Start Time  1145    PT Stop Time  1230    PT Time Calculation (min)  45 min       Past Medical History:  Diagnosis Date  . Atrial fibrillation ([place])   . Bipolar affective ([place])   . CHF (congestive heart failure) ([place])   . Chronic back pain   . Diabetes mellitus without complication ([place])   . Hyperlipidemia   . Hypertension   . Morbid obesity due to excess calories ([place]) 07/06/2018  . Pain management   . Renal insufficiency     Past Surgical History:  Procedure Laterality Date  . BREAST REDUCTION SURGERY    . EYE SURGERY Right    cateracts  . HAMMER TOE SURGERY    . LEFT HEART CATH AND CORONARY ANGIOGRAPHY N/A 02/03/2018   Procedure: LEFT HEART CATH AND CORONARY ANGIOGRAPHY;  Surgeon: Martinique, Peter M, MD;  Location: [facility];  Service: Cardiovascular;  Laterality: N/A;  . SHOULDER SURGERY Right   . THIGH SURGERY      Vitals:   07/28/18 1151  Pulse: 70  SpO2: 96%    Subjective Assessment - 07/29/18 1641    Subjective  Pt states she plans to start Pulmonary rehab at end of March and then plans to have shoulder surgery at end of April    Pertinent History  Type 2 DM:  Left humeral fracture due to fall: diabetic neuropathy; chronic back pain;  atrial fibrillation; pulmonary hypertension (per husband's report);  dyspnea and SOB; c/o severe Rt shoulder pain    Patient Stated Goals  "strengthen my core and my legs"-  to be able to do housework easier          Self Care: Pt instructed in HEP for LE strengthening and balance exercises; discussed benefit of walking program but pt states She has been in a lot of pain with Rt shoulder and this pain has limited her activity significantly  PT states she is scheduled to start pulmonary rehab at end of March and then is scheduled to have Rt shoulder surgery end of April    TherEx:  See pt instructions for LE strengthening exercises  Pt performed hamstring stretching in standing (Runner's stretch) 30 sec hold each leg  Heel cord strech with bottom shelf of cabinet used - each leg 1 rep 30 sec hold                 PT Education - 07/29/18 1644    Education Details  Pt instructed in LE strengthening exercises for HEP; also instructed in hamstring stretch and runner's stretch    Person(s) Educated  Patient    Methods  Explanation;Handout;Demonstration    Comprehension  Verbalized understanding          PT Long Term Goals - 07/29/18 1652      PT LONG TERM GOAL #1  Title  Independent with HEP including walking program.    Baseline  Pt reports she is having Rt shoulder surgery end of April - reports this pain limits activity -- 07-28-18    Status  Deferred      PT LONG TERM GOAL #2   Title  Referral to pulmonary rehab for close monitoring of oxygen saturation rates with exercise.    Baseline  Pt states she is scheduled to start Pulmonary Rehab end of March - 07-28-18    Status  Achieved              Patient will benefit from skilled therapeutic intervention in order to improve the following deficits and impairments:     Visit Diagnosis: Other abnormalities of gait and mobility     Problem List Patient Active Problem List   Diagnosis Date Noted  . Obesity (BMI 30-39.9) 07/06/2018  . At risk for sleep apnea 07/06/2018  . Closed fracture of proximal end of left humerus 03/16/2018  . Left humeral fracture 03/06/2018  .  Fracture of four ribs of left side, closed, initial encounter 03/06/2018  . Mild intermittent asthma without complication 25/85/2778  . Cardiomegaly 01/12/2018  . NAFL (nonalcoholic fatty liver) 24/23/5361  . Age-related osteoporosis without current pathological fracture 12/15/2017  . Asthma 11/12/2017  . Atrial fibrillation with RVR ([place]) 05/19/2017  . Microscopic colitis 05/19/2017  . Hypotension 05/19/2017  . Fibromyalgia 03/19/2017  . Healthcare maintenance 02/12/2016  . Migraine headache with aura 02/12/2016  . Diabetic neuropathy ([place]) 02/06/2016  . Depression   . Herpes genitalis in women 07/16/2015  . Chronic anticoagulation 05/30/2015  . Family history of coronary artery disease in sister 05/30/2015  . Chronic diastolic CHF (congestive heart failure) ([place]) 05/30/2015  . Chest pain with moderate risk of acute coronary syndrome 05/29/2015  . Dyspnea on exertion   . Essential hypertension   . RLS (restless legs syndrome) 04/27/2015  . Lipoma 02/08/2015  . Bipolar disorder ([place]) 01/23/2015  . Insomnia 01/23/2015  . Chronic back pain 01/04/2015  . Permanent atrial fibrillation 01/04/2015  . DM2 (diabetes mellitus, type 2) ([place])   . Hyperlipidemia       PHYSICAL THERAPY DISCHARGE SUMMARY  Visits from Start of Care: 2  Current functional level related to goals / functional outcomes: PT Long Term Goals - 07/29/18 1652      PT LONG TERM GOAL #1   Title  Independent with HEP including walking program.    Baseline  Pt reports she is having Rt shoulder surgery end of April - reports this pain limits activity -- 07-28-18    Status  Deferred      PT LONG TERM GOAL #2   Title  Referral to pulmonary rehab for close monitoring of oxygen saturation rates with exercise.    Baseline  Pt states she is scheduled to start Pulmonary Rehab end of March - 07-28-18    Status  Achieved        Remaining deficits: Continued dyspnea on exertion - pt is scheduled to start pulmonary rehab  end of March  Continued LE weakness; Cont. Decreased endurance/activity tolerance    Education / Equipment: Pt has been instructed in HEP for LE strengthening and stretching - pt reports she has not started walking program due to Rt Shoulder pain which has impacted mobility  Plan: Patient agrees to discharge.  Patient goals were met. Patient is being discharged due to meeting the stated rehab goals.  ?????   Pt is discharged  from PT due to pt being referred to Pulmonary Rehab - scheduled to start at end of March per pt report.            Alda Lea, PT 07/29/2018, 10:06 PM  [facility] [address] [place], [place], [address] Phone: [phone]   Fax:  [phone]  Name: Amber Stephenson MRN: [id number] Date of Birth: [birth year]

## 2018-08-03 ENCOUNTER — Other Ambulatory Visit: Payer: Self-pay | Admitting: Family Medicine

## 2018-08-03 ENCOUNTER — Ambulatory Visit (INDEPENDENT_AMBULATORY_CARE_PROVIDER_SITE_OTHER): Payer: Medicare Other | Admitting: Family Medicine

## 2018-08-03 VITALS — BP 144/99 | HR 67 | Temp 97.8°F | Wt 207.0 lb

## 2018-08-03 DIAGNOSIS — I4821 Permanent atrial fibrillation: Secondary | ICD-10-CM

## 2018-08-03 DIAGNOSIS — I5032 Chronic diastolic (congestive) heart failure: Secondary | ICD-10-CM

## 2018-08-03 DIAGNOSIS — E782 Mixed hyperlipidemia: Secondary | ICD-10-CM

## 2018-08-03 DIAGNOSIS — G2581 Restless legs syndrome: Secondary | ICD-10-CM

## 2018-08-03 DIAGNOSIS — L304 Erythema intertrigo: Secondary | ICD-10-CM | POA: Diagnosis not present

## 2018-08-03 MED ORDER — ROPINIROLE HCL 0.25 MG PO TABS: 0.2500 mg | ORAL_TABLET | Freq: Every day | ORAL | 0 refills | Status: DC

## 2018-08-03 MED ORDER — KETOCONAZOLE 2 % EX CREA: TOPICAL_CREAM | CUTANEOUS | 1 refills | Status: DC

## 2018-08-03 MED ORDER — ICOSAPENT ETHYL 1 G PO CAPS: 2.0000 | ORAL_CAPSULE | Freq: Two times a day (BID) | ORAL | 12 refills | Status: DC

## 2018-08-03 NOTE — Progress Notes (Signed)
Subjective: CC: intertrigo PCP: [name], DO RCB:ULAGTX D Amber Stephenson is a 73 y.o. female presenting to clinic today for:  1. Intertrigo Patient follows up for return of the rash that she previously had in her groin and under her belly.  She notes that it had totally resolved with use of the ketoconazole previously but now started coming back.  She is wondering what things she can do to keep the area dry.  She does use cotton underwear and loose clothing.  She reports itching and burning of the rash.  She denies any purulent discharge. No fevers. She needs a refill on the ketoconazole.  2.  Hyperlipidemia/atrial fibrillation/CHF Patient reports compliance with atorvastatin and TriCor.  She would like to switch over to Vascepa.  No chest pain, shortness of breath.  She is a type II diabetic  3.  Restless leg Patient with known restless leg syndrome.  She notes that it is especially bad at nighttime.  The legs tingle and she has to get up and move around in efforts to relieve the symptoms.  Of note, she is also treated with Ativan, Lunesta and Lyrica.  She notes that it takes her several hours before she is able to fall asleep with the Lunesta because of restless legs.   ROS: Per HPI  Allergies  Allergen Reactions  . Ivp Dye [Iodinated Diagnostic Agents] Anaphylaxis and Swelling    Throat closes   Past Medical History:  Diagnosis Date  . Atrial fibrillation ([name])   . Bipolar affective ([name])   . CHF (congestive heart failure) ([place])   . Chronic back pain   . Diabetes mellitus without complication ([name])   . Hyperlipidemia   . Hypertension   . Morbid obesity due to excess calories ([name]) 07/06/2018  . Pain management   . Renal insufficiency     Current Outpatient Medications:  .  albuterol (PROVENTIL) (2.5 MG/3ML) 0.083% nebulizer solution, Take 3 mLs (2.5 mg total) by nebulization every 6 (six) hours as needed for wheezing or shortness of breath., Disp: 75 mL, Rfl: 1 .   ARIPiprazole (ABILIFY) 10 MG tablet, Take 3 tablets (30 mg total) by mouth daily., Disp: 90 tablet, Rfl: 4 .  atorvastatin (LIPITOR) 40 MG tablet, TAKE 1 TABLET DAILY, Disp: 90 tablet, Rfl: 1 .  Blood Glucose Monitoring Suppl (FREESTYLE LITE) DEVI, Use to check blood sugar tid. DX E11.22, Disp: 1 each, Rfl: 0 .  budesonide-formoterol (SYMBICORT) 80-4.5 MCG/ACT inhaler, Inhale 2 puffs into the lungs 2 (two) times daily., Disp: 2 Inhaler, Rfl: 0 .  Cholecalciferol (VITAMIN D3) 1000 UNITS CAPS, Take 1,000 Units by mouth daily., Disp: , Rfl:  .  diltiazem (CARDIZEM CD) 120 MG 24 hr capsule, Take 1 capsule (120 mg total) by mouth at bedtime., Disp: 90 capsule, Rfl: 3 .  Dulaglutide (TRULICITY) 1.5 MI/6.8EH SOPN, Inject 1.5 mg into the skin once a week., Disp: 12 pen, Rfl: 3 .  ELIQUIS 5 MG TABS tablet, TAKE 1 TABLET TWICE A DAY (NEED TO BE SEEN BEFORE NEXT REFILL), Disp: 180 tablet, Rfl: 1 .  Eszopiclone (ESZOPICLONE) 3 MG TABS, Take 1 tablet (3 mg total) by mouth at bedtime as needed. Take immediately before bedtime, Disp: 30 tablet, Rfl: 4 .  furosemide (LASIX) 20 MG tablet, Take 1 tablet (20 mg total) by mouth 2 (two) times daily., Disp: 180 tablet, Rfl: 1 .  glucose blood (FREESTYLE LITE) test strip, USE FOR TESTING SUGAR THREE TIMES DAILY, Disp: 300 each, Rfl: 3 .  ibuprofen (ADVIL,MOTRIN) 800 MG tablet, Take 1 tablet (800 mg total) by mouth every 8 (eight) hours as needed. Use sparingly with being on Eliquis, Disp: 30 tablet, Rfl: 1 .  Insulin Glargine (LANTUS SOLOSTAR) 100 UNIT/ML Solostar Pen, INJECT 60 UNITS UNDER THE SKIN TWICE A DAY (Patient taking differently: Inject 60 Units into the skin 2 (two) times daily. ), Disp: 90 mL, Rfl: 0 .  Insulin Pen Needle (SURE COMFORT PEN NEEDLES) 32G X 4 MM MISC, Use with insulin pens as directed. Dx E11.9, Disp: 300 each, Rfl: 11 .  ketoconazole (NIZORAL) 2 % cream, Apply to affected areas in groin region ONE time daily for 3-4 weeks., Disp: 60 g, Rfl: 1 .   Lancets (FREESTYLE) lancets, Test tid. DX E11.22, Disp: 100 each, Rfl: 5 .  LORazepam (ATIVAN) 1 MG tablet, Take 1 tab (1 mg) in the morning and 2 tabs (2 mg ) at bedtime, Disp: 90 tablet, Rfl: 4 .  methocarbamol (ROBAXIN) 500 MG tablet, Take 1 tablet (500 mg total) by mouth every 8 (eight) hours as needed for muscle spasms., Disp: 15 tablet, Rfl: 0 .  metoprolol tartrate (LOPRESSOR) 100 MG tablet, Take 1 tablet (100 mg total) by mouth 2 (two) times daily. Take with metoprolol 25 mg to equal 125 mg, Disp: 28 tablet, Rfl: 0 .  metoprolol tartrate (LOPRESSOR) 25 MG tablet, Take 1 tablet (25 mg total) by mouth 2 (two) times daily. Take with metoprolol 100 mg to equal 125 mg, Disp: 28 tablet, Rfl: 0 .  naloxone (NARCAN) nasal spray 4 mg/0.1 mL, Place 1 spray into the nose once as needed (opiod overdose)., Disp: , Rfl:  .  pregabalin (LYRICA) 150 MG capsule, Take 1 capsule (150 mg total) by mouth 2 (two) times daily., Disp: 180 capsule, Rfl: 4 .  promethazine (PHENERGAN) 25 MG tablet, Take 1 tablet (25 mg total) by mouth every 8 (eight) hours as needed for nausea or vomiting., Disp: 20 tablet, Rfl: 0 .  rizatriptan (MAXALT-MLT) 10 MG disintegrating tablet, DISSOLVE UNDER TONGUE AT FIRST SIGN OF HEADACHE MAY REPEAT IN 1 HR AS NEEDED, Disp: 10 tablet, Rfl: 2 .  traZODone (DESYREL) 100 MG tablet, 4  qhs, Disp: 120 tablet, Rfl: 4 .  TRICOR 145 MG tablet, TAKE 1 TABLET DAILY (NEED TO BE SEEN BEFORE NEXT REFILL), Disp: 90 tablet, Rfl: 3 Social History   Socioeconomic History  . Marital status: Married    Spouse name: Not on file  . Number of children: 3  . Years of education: 16  . Highest education level: Bachelor's degree (e.g., BA, AB, BS)  Occupational History  . Not on file  Social Needs  . Financial resource strain: Not hard at all  . Food insecurity:    Worry: Never true    Inability: Never true  . Transportation needs:    Medical: No    Non-medical: No  Tobacco Use  . Smoking status: Never  Smoker  . Smokeless tobacco: Never Used  Substance and Sexual Activity  . Alcohol use: No    Alcohol/week: 0.0 standard drinks  . Drug use: No  . Sexual activity: Yes    Partners: Male    Birth control/protection: Post-menopausal  Lifestyle  . Physical activity:    Days per week: 0 days    Minutes per session: 0 min  . Stress: Not at all  Relationships  . Social connections:    Talks on phone: More than three times a week    Gets together:  Once a week    Attends religious service: More than 4 times per year    Active member of club or organization: No    Attends meetings of clubs or organizations: Never    Relationship status: Married  . Intimate partner violence:    Fear of current or ex partner: No    Emotionally abused: No    Physically abused: No    Forced sexual activity: No  Other Topics Concern  . Not on file  Social History Narrative   Lives at home with husband.    Family History  Problem Relation Age of Onset  . Diabetes Mother   . Heart disease Mother   . Heart disease Brother   . Heart disease Sister        CABG  . Alcohol abuse Sister   . Mental illness Brother   . Diabetes Brother     Objective: Office vital signs reviewed. BP (!) 144/99   Pulse 67   Temp 97.8 F (36.6 C)   Wt 207 lb (93.9 kg)   BMI 37.86 kg/m   Physical Examination:  General: Awake, alert, obese, No acute distress HEENT: Normal, sclera white, MMM Cardio: regular rate and rhythm, S1S2 heard, no murmurs appreciated Pulm: clear to auscultation bilaterally, no wheezes, rhonchi or rales; normal work of breathing on room air Extremities: warm, well perfused, No edema, cyanosis or clubbing; +2 pulses bilaterally Neuro: no tremor noted Skin: patient refused skin exam.  Assessment/ Plan: 73 y.o. female   1. Mixed hyperlipidemia I have given her a box of samples and discontinue the TriCor and replaced it with Vascepa.  We will plan to recheck lipid panel in about 3 months -  Icosapent Ethyl (VASCEPA) 1 g CAPS; Take 2 capsules (2 g total) by mouth 2 (two) times daily with a meal.  Dispense: 120 capsule; Refill: 12  2. Permanent atrial fibrillation - Icosapent Ethyl (VASCEPA) 1 g CAPS; Take 2 capsules (2 g total) by mouth 2 (two) times daily with a meal.  Dispense: 120 capsule; Refill: 12  3. RLS (restless legs syndrome) Start Requip 0.25 mg nightly.  Follow-up in 4 weeks we will advance medication if needed.  Hopefully this will allow her to come off of Lunesta for sleep.  4. Chronic diastolic CHF (congestive heart failure) (HCC) - Icosapent Ethyl (VASCEPA) 1 g CAPS; Take 2 capsules (2 g total) by mouth 2 (two) times daily with a meal.  Dispense: 120 capsule; Refill: 12  5. Intertrigo Patient refused skin exam but presumed intertrigo which was noted previously.  She denies any symptoms that would be concerning for bacterial infection.  I have discussed ways to keep the areas dry.  Weight loss was recommended.  I refilled her ketoconazole and she will follow-up PRN.   No orders of the defined types were placed in this encounter.  Meds ordered this encounter  Medications  . Icosapent Ethyl (VASCEPA) 1 g CAPS    Sig: Take 2 capsules (2 g total) by mouth 2 (two) times daily with a meal.    Dispense:  120 capsule    Refill:  12  . rOPINIRole (REQUIP) 0.25 MG tablet    Sig: Take 1 tablet (0.25 mg total) by mouth at bedtime.    Dispense:  30 tablet    Refill:  0  . ketoconazole (NIZORAL) 2 % cream    Sig: Apply to affected areas in groin region ONE time daily for 3-4 weeks.    Dispense:  60 g    Refill:  [place], DO [place] [phone]

## 2018-08-03 NOTE — Patient Instructions (Signed)
I have changed you tricor to vascepa.   I have added Ropinrole 0.25mg  to take every night at bedtime for restless leg.  We might be able to get you off Lunesta if we can control your restless leg.   Restless Legs Syndrome Restless legs syndrome is a condition that causes uncomfortable feelings or sensations in the legs, especially while sitting or lying down. The sensations usually cause an overwhelming urge to move the legs. The arms can also sometimes be affected. The condition can range from mild to severe. The symptoms often interfere with a person's ability to sleep. What are the causes? The cause of this condition is not known. What increases the risk? The following factors may make you more likely to develop this condition:  Being older than 50.  Pregnancy.  Being a woman. In general, the condition is more common in women than in men.  A family history of the condition.  Having iron deficiency.  Overuse of caffeine, nicotine, or alcohol.  Certain medical conditions, such as kidney disease, Parkinson's disease, or nerve damage.  Certain medicines, such as those for high blood pressure, nausea, colds, allergies, depression, and some heart conditions. What are the signs or symptoms? The main symptom of this condition is uncomfortable sensations in the legs, such as:  Pulling.  Tingling.  Prickling.  Throbbing.  Crawling.  Burning. Usually, the sensations:  Affect both sides of the body.  Are worse when you sit or lie down.  Are worse at night. These may wake you up or make it difficult to fall asleep.  Make you have a strong urge to move your legs.  Are temporarily relieved by moving your legs. The arms can also be affected, but this is rare. People who have this condition often have tiredness during the day because of their lack of sleep at night. How is this diagnosed? This condition may be diagnosed based on:  Your symptoms.  Blood tests. In some  cases, you may be monitored in a sleep lab by a specialist (a sleep study). This can detect any disruptions in your sleep. How is this treated? This condition is treated by managing the symptoms. This may include:  Lifestyle changes, such as exercising, using relaxation techniques, and avoiding caffeine, alcohol, or tobacco.  Medicines. Anti-seizure medicines may be tried first. Follow these instructions at home:     General instructions  Take over-the-counter and prescription medicines only as told by your health care provider.  Use methods to help relieve the uncomfortable sensations, such as: ? Massaging your legs. ? Walking or stretching. ? Taking a cold or hot bath.  Keep all follow-up visits as told by your health care provider. This is important. Lifestyle  Practice good sleep habits. For example, go to bed and get up at the same time every day. Most adults should get 7-9 hours of sleep each night.  Exercise regularly. Try to get at least 30 minutes of exercise most days of the week.  Practice ways of relaxing, such as yoga or meditation.  Avoid caffeine and alcohol.  Do not use any products that contain nicotine or tobacco, such as cigarettes and e-cigarettes. If you need help quitting, ask your health care provider. Contact a health care provider if:  Your symptoms get worse or they do not improve with treatment. Summary  Restless legs syndrome is a condition that causes uncomfortable feelings or sensations in the legs, especially while sitting or lying down.  The symptoms often interfere with  a person's ability to sleep.  This condition is treated by managing the symptoms. You may need to make lifestyle changes or take medicines. This information is not intended to replace advice given to you by your health care provider. Make sure you discuss any questions you have with your health care provider. Document Released: 05/09/2002 Document Revised: 06/08/2017 Document  Reviewed: 06/08/2017 [employer]vier Interactive Patient Education  2019 [employer].

## 2018-08-04 DIAGNOSIS — R809 Proteinuria, unspecified: Secondary | ICD-10-CM | POA: Diagnosis not present

## 2018-08-04 DIAGNOSIS — N2581 Secondary hyperparathyroidism of renal origin: Secondary | ICD-10-CM | POA: Diagnosis not present

## 2018-08-04 DIAGNOSIS — N182 Chronic kidney disease, stage 2 (mild): Secondary | ICD-10-CM | POA: Diagnosis not present

## 2018-08-04 DIAGNOSIS — E559 Vitamin D deficiency, unspecified: Secondary | ICD-10-CM | POA: Diagnosis not present

## 2018-08-06 ENCOUNTER — Ambulatory Visit (INDEPENDENT_AMBULATORY_CARE_PROVIDER_SITE_OTHER): Payer: Medicare Other | Admitting: Neurology

## 2018-08-06 ENCOUNTER — Encounter: Payer: Self-pay | Admitting: Neurology

## 2018-08-06 ENCOUNTER — Other Ambulatory Visit: Payer: Self-pay

## 2018-08-06 VITALS — BP 132/78 | HR 66 | Ht 62.0 in | Wt 208.0 lb

## 2018-08-06 DIAGNOSIS — G43109 Migraine with aura, not intractable, without status migrainosus: Secondary | ICD-10-CM | POA: Diagnosis not present

## 2018-08-06 DIAGNOSIS — R413 Other amnesia: Secondary | ICD-10-CM | POA: Diagnosis not present

## 2018-08-06 MED ORDER — TOPIRAMATE 25 MG PO TABS: ORAL_TABLET | ORAL | 11 refills | Status: DC

## 2018-08-06 NOTE — Progress Notes (Signed)
NEUROLOGY FOLLOW UP OFFICE NOTE  Amber Stephenson [id number] [birth year]  HISTORY OF PRESENT ILLNESS: I had the pleasure of seeing Amber Stephenson in follow-up in the neurology clinic on 08/06/2018.  The patient was last seen 7 months ago for memory changes. MRI brain no acute changes.TSH and B12 normal.  MMSE normal 28/30 in August 2019, 30/30 in April 2019). She feels her memory is getting worse, she is having more word-finding difficulties, but eventually thinks of the word 15 minutes later. She does not drive. She denies missing any bill payments. Her husband manages medications. Her husband has not noticed any memory issues. Her main concern today are the migraines. She has a history of migraines with aura with around 20 migraine days a month. She usually sees flashing/floaters on one side, one side of her head tingles, tinnitus, then has a left frontal throbbing headache. Sometimes she has an aura without progression to headache. If she does not catch it early, she would have nausea and vomiting. She had a migraine for 10 days last month that improved after a migraine cocktail. She goes through both prescriptions of Maxalt and Imitrex every month. She is on metoprolol 125mg  BID and Lyrica 150mg  BID for other issues. She reports sleep is much better with addition of Lunesta. She has noticed alcohol can trigger migraines. She denies any focal numbness/tingling. She has right shoulder pain with plans for surgery in April. She fell last October and injured her other shoulder. She has not seen a neurologist in the past for the migraines and has not tried any other preventative medications.  History on Initial Assessment 07/17/2017: This is a 73 year old right-handed woman with a history of hypertension, hyperlipidemia, diabetes, atrial fibrillation, chronic pain, bipolar disorder, presenting for evaluation of memory changes. She feels her memory has gotten really bad over the past 2 months. She cannot  remember words/word-finding difficulties. She "does not think it's Alzheimers" but feels like she is not paying attention. Family reminds her that they had told her something already previously. Her husband has to remind her of her medications. They put her pills together in a pillbox. Her husband is in charge of finances. She stopped driving 4 years ago, she denied getting lost when she used to drive. She started noticing memory changes after she was started on a medication after she had her colonoscopy. She has also been dealing with a lot of pain, she states her Percocet dose was cut in half a month ago and this is causing her a lot of issues. She has poor sleep, 3 hours at the most. She was recently started on Trazodone by her psychiatrist, which may be helping. Her father had Alzheimer's disease. She denies any history of significant head injuries, no alcohol use.  She has frequent headaches, around 20 headache days a month with stabbing pain in the frontal region that can last up to 3 days, with associated nausea/vomiting. If she takes medication, pain lasts up to 2 hours. She has dizziness at least 1-2 times a day where it feels like the room is spinning. She has had falls for "no clear reason," last fall was 3 months ago. She has chronic neck and back pain, no focal numbness/tingling/weakness. She has tremors in both hands and has a diagnosis of essential tremor, it is "very difficult to do things," it has affected her handwriting, computer use. No family history of tremors. No bowel/bladder dysfunction or anosmia.   I personally reviewed MRI brain without  contrast done 05/13/17 which did not show any acute changes. There was mild diffuse atrophy and mild to moderate chronic microvascular disease.  PAST MEDICAL HISTORY: Past Medical History:  Diagnosis Date  . Atrial fibrillation ([place])   . Bipolar affective ([place])   . CHF (congestive heart failure) ([place])   . Chronic back pain   . Diabetes  mellitus without complication ([place])   . Hyperlipidemia   . Hypertension   . Morbid obesity due to excess calories ([place]) 07/06/2018  . Pain management   . Renal insufficiency     MEDICATIONS: Current Outpatient Medications on File Prior to Visit  Medication Sig Dispense Refill  . albuterol (PROVENTIL) (2.5 MG/3ML) 0.083% nebulizer solution Take 3 mLs (2.5 mg total) by nebulization every 6 (six) hours as needed for wheezing or shortness of breath. 75 mL 1  . ARIPiprazole (ABILIFY) 10 MG tablet Take 3 tablets (30 mg total) by mouth daily. 90 tablet 4  . atorvastatin (LIPITOR) 40 MG tablet TAKE 1 TABLET DAILY 90 tablet 1  . Blood Glucose Monitoring Suppl (FREESTYLE LITE) DEVI Use to check blood sugar tid. DX E11.22 1 each 0  . budesonide-formoterol (SYMBICORT) 80-4.5 MCG/ACT inhaler Inhale 2 puffs into the lungs 2 (two) times daily. 2 Inhaler 0  . Cholecalciferol (VITAMIN D3) 1000 UNITS CAPS Take 1,000 Units by mouth daily.    Marland Kitchen diltiazem (CARDIZEM CD) 120 MG 24 hr capsule Take 1 capsule (120 mg total) by mouth at bedtime. 90 capsule 3  . Dulaglutide (TRULICITY) 1.5 QQ/5.9DG SOPN Inject 1.5 mg into the skin once a week. 12 pen 3  . ELIQUIS 5 MG TABS tablet TAKE 1 TABLET TWICE A DAY (NEED TO BE SEEN BEFORE NEXT REFILL) 180 tablet 1  . Eszopiclone (ESZOPICLONE) 3 MG TABS Take 1 tablet (3 mg total) by mouth at bedtime as needed. Take immediately before bedtime 30 tablet 4  . furosemide (LASIX) 20 MG tablet Take 1 tablet (20 mg total) by mouth 2 (two) times daily. 180 tablet 1  . glucose blood (FREESTYLE LITE) test strip USE FOR TESTING SUGAR THREE TIMES DAILY 300 each 3  . ibuprofen (ADVIL,MOTRIN) 800 MG tablet Take 1 tablet (800 mg total) by mouth every 8 (eight) hours as needed. Use sparingly with being on Eliquis 30 tablet 1  . Icosapent Ethyl (VASCEPA) 1 g CAPS Take 2 capsules (2 g total) by mouth 2 (two) times daily with a meal. 120 capsule 12  . Insulin Glargine (LANTUS SOLOSTAR) 100 UNIT/ML  Solostar Pen INJECT 60 UNITS UNDER THE SKIN TWICE A DAY (Patient taking differently: Inject 60 Units into the skin 2 (two) times daily. ) 90 mL 0  . Insulin Pen Needle (SURE COMFORT PEN NEEDLES) 32G X 4 MM MISC Use with insulin pens as directed. Dx E11.9 300 each 11  . ketoconazole (NIZORAL) 2 % cream Apply to affected areas in groin region ONE time daily for 3-4 weeks. 60 g 1  . Lancets (FREESTYLE) lancets Test tid. DX E11.22 100 each 5  . LORazepam (ATIVAN) 1 MG tablet Take 1 tab (1 mg) in the morning and 2 tabs (2 mg ) at bedtime 90 tablet 4  . methocarbamol (ROBAXIN) 500 MG tablet Take 1 tablet (500 mg total) by mouth every 8 (eight) hours as needed for muscle spasms. 15 tablet 0  . metoprolol tartrate (LOPRESSOR) 100 MG tablet Take 1 tablet (100 mg total) by mouth 2 (two) times daily. Take with metoprolol 25 mg to equal 125 mg 28  tablet 0  . metoprolol tartrate (LOPRESSOR) 25 MG tablet Take 1 tablet (25 mg total) by mouth 2 (two) times daily. Take with metoprolol 100 mg to equal 125 mg 28 tablet 0  . naloxone (NARCAN) nasal spray 4 mg/0.1 mL Place 1 spray into the nose once as needed (opiod overdose).    . pregabalin (LYRICA) 150 MG capsule Take 1 capsule (150 mg total) by mouth 2 (two) times daily. 180 capsule 4  . promethazine (PHENERGAN) 25 MG tablet Take 1 tablet (25 mg total) by mouth every 8 (eight) hours as needed for nausea or vomiting. 20 tablet 0  . rizatriptan (MAXALT-MLT) 10 MG disintegrating tablet DISSOLVE UNDER TONGUE AT FIRST SIGN OF HEADACHE MAY REPEAT IN 1 HR AS NEEDED 10 tablet 2  . rOPINIRole (REQUIP) 0.25 MG tablet Take 1 tablet (0.25 mg total) by mouth at bedtime. 30 tablet 0  . traZODone (DESYREL) 100 MG tablet 4  qhs 120 tablet 4   No current facility-administered medications on file prior to visit.     ALLERGIES: Allergies  Allergen Reactions  . Ivp Dye [Iodinated Diagnostic Agents] Anaphylaxis and Swelling    Throat closes    FAMILY HISTORY: Family History    Problem Relation Age of Onset  . Diabetes Mother   . Heart disease Mother   . Heart disease Brother   . Heart disease Sister        CABG  . Alcohol abuse Sister   . Mental illness Brother   . Diabetes Brother     SOCIAL HISTORY: Social History   Socioeconomic History  . Marital status: Married    Spouse name: Not on file  . Number of children: 3  . Years of education: 16  . Highest education level: Bachelor's degree (e.g., BA, AB, BS)  Occupational History  . Not on file  Social Needs  . Financial resource strain: Not hard at all  . Food insecurity:    Worry: Never true    Inability: Never true  . Transportation needs:    Medical: No    Non-medical: No  Tobacco Use  . Smoking status: Never Smoker  . Smokeless tobacco: Never Used  Substance and Sexual Activity  . Alcohol use: No    Alcohol/week: 0.0 standard drinks  . Drug use: No  . Sexual activity: Yes    Partners: Male    Birth control/protection: Post-menopausal  Lifestyle  . Physical activity:    Days per week: 0 days    Minutes per session: 0 min  . Stress: Not at all  Relationships  . Social connections:    Talks on phone: More than three times a week    Gets together: Once a week    Attends religious service: More than 4 times per year    Active member of club or organization: No    Attends meetings of clubs or organizations: Never    Relationship status: Married  . Intimate partner violence:    Fear of current or ex partner: No    Emotionally abused: No    Physically abused: No    Forced sexual activity: No  Other Topics Concern  . Not on file  Social History Narrative   Lives at home with husband.     REVIEW OF SYSTEMS: Constitutional: No fevers, chills, or sweats, no generalized fatigue, change in appetite Eyes: No visual changes, double vision, eye pain Ear, nose and throat: No hearing loss, ear pain, nasal congestion, sore throat Cardiovascular: No  chest pain,  palpitations Respiratory:  No shortness of breath at rest or with exertion, wheezes GastrointestinaI: No nausea, vomiting, diarrhea, abdominal pain, fecal incontinence Genitourinary:  No dysuria, urinary retention or frequency Musculoskeletal:  + neck pain, back pain Integumentary: No rash, pruritus, skin lesions Neurological: as above Psychiatric: No depression, insomnia, anxiety Endocrine: No palpitations, fatigue, diaphoresis, mood swings, change in appetite, change in weight, increased thirst Hematologic/Lymphatic:  No anemia, purpura, petechiae. Allergic/Immunologic: no itchy/runny eyes, nasal congestion, recent allergic reactions, rashes  PHYSICAL EXAM: Vitals:   08/06/18 1127  BP: 132/78  Pulse: 66  SpO2: 92%   General: No acute distress Head:  Normocephalic/atraumatic Neck: supple, no paraspinal tenderness, full range of motion Heart:  Regular rate and rhythm Lungs:  Clear to auscultation bilaterally Back: No paraspinal tenderness Skin/Extremities: No rash, no edema Neurological Exam: alert and oriented to person, place, and time. No aphasia or dysarthria. Fund of knowledge is appropriate.  Recent and remote memory are intact.  Attention and concentration are normal.    Able to name objects and repeat phrases.  Montreal Cognitive Assessment  08/06/2018  Visuospatial/ Executive (0/5) 4  Naming (0/3) 3  Attention: Read list of digits (0/2) 2  Attention: Read list of letters (0/1) 1  Attention: Serial 7 subtraction starting at 100 (0/3) 1  Language: Repeat phrase (0/2) 1  Language : Fluency (0/1) 0  Abstraction (0/2) 1  Delayed Recall (0/5) 4  Orientation (0/6) 6  Total 23    MMSE - Mini Mental State Exam 01/18/2018 09/22/2017 04/29/2017  Orientation to time 5 5 5   Orientation to Place 5 5 4   Registration 3 3 3   Attention/ Calculation 3 5 5   Recall 3 3 3   Language- name 2 objects 2 2 2   Language- repeat 1 1 1   Language- follow 3 step command 3 3 3   Language- read &  follow direction 1 1 1   Write a sentence 1 1 1   Copy design 1 1 1   Total score 28 30 29    Cranial nerves: Pupils equal, round, reactive to light. Extraocular movements intact with no nystagmus. Visual fields full. Facial sensation intact. No facial asymmetry. Tongue, uvula, palate midline.  Motor: Bulk and tone normal, muscle strength 5/5 throughout with no pronator drift.  Sensation to light touch intact.  No extinction to double simultaneous stimulation.  Deep tendon reflexes +1 throughout, toes downgoing.  Finger to nose testing intact.  Gait narrow-based and steady, able to tandem walk adequately.  Romberg negative.  IMPRESSION: This is a 73 yo RH woman with a history of hypertension, hyperlipidemia, diabetes, atrial fibrillation, chronic pain, bipolar disorder, who presented for memory changes. MOCA score today 23/30. No clear difficulties with complex tasks, symptoms suggestive of mild cognitive impairment. She is concerned today about frequent migraines with aura. She is on a beta-blocker and Lyrica for other medical issues with continued headaches. We discussed trying Topamax for migraine prophylaxis, start 25mg  qhs and uptitrate every week to 75mg  qhs. Side effects discussed.  She was advised to keep a migraine calendar and update our office in 2 months, we may increase Topamax dose as tolerated. She is asking about the new CGRP receptor antagonists for migraine prevention, we discussed indications and restrictions (should have tried several oral preventatives before insurance approval). This may be an option in the future. She was advised to minimize rescue medication to 2-3 a week to avoid rebound headaches. Follow-up in 6 months, she knows to call for any changes.  Thank  you for allowing me to participate in her care.  Please do not hesitate to call for any questions or concerns.  The duration of this appointment visit was 30 minutes of face-to-face time with the patient.  Greater than 50% of  this time was spent in counseling, explanation of diagnosis, planning of further management, and coordination of care.   Ellouise Newer, M.D.   CC: Dr. Lajuana Ripple

## 2018-08-06 NOTE — Patient Instructions (Signed)
1. Start Topamax 25mg : take 1 tablet every night for 1 week, then increase to 2 tablets every night for 1 week, then increase to 3 tablets every night and continue. Keep a calendar of your migraines and contact our office for an update in 3 months, we may increase dose if necessary  2. Follow-up in 6 months, call for any changes   RECOMMENDATIONS FOR ALL PATIENTS WITH MEMORY PROBLEMS: 1. Continue to exercise (Recommend 30 minutes of walking everyday, or 3 hours every week) 2. Increase social interactions - continue going to [place] and enjoy social gatherings with friends and family 3. Eat healthy, avoid fried foods and eat more fruits and vegetables 4. Maintain adequate blood pressure, blood sugar, and blood cholesterol level. Reducing the risk of stroke and cardiovascular disease also helps promoting better memory. 5. Avoid stressful situations. Live a simple life and avoid aggravations. Organize your time and prepare for the next day in anticipation. 6. Sleep well, avoid any interruptions of sleep and avoid any distractions in the bedroom that may interfere with adequate sleep quality 7. Avoid sugar, avoid sweets as there is a strong link between excessive sugar intake, diabetes, and cognitive impairment The Mediterranean diet has been shown to help patients reduce the risk of progressive memory disorders and reduces cardiovascular risk. This includes eating fish, eat fruits and green leafy vegetables, nuts like almonds and hazelnuts, walnuts, and also use olive oil. Avoid fast foods and fried foods as much as possible. Avoid sweets and sugar as sugar use has been linked to worsening of memory function.

## 2018-08-09 ENCOUNTER — Telehealth: Payer: Self-pay | Admitting: Cardiovascular Disease

## 2018-08-09 DIAGNOSIS — M545 Low back pain: Secondary | ICD-10-CM | POA: Diagnosis not present

## 2018-08-09 DIAGNOSIS — G8929 Other chronic pain: Secondary | ICD-10-CM | POA: Diagnosis not present

## 2018-08-09 DIAGNOSIS — M25511 Pain in right shoulder: Secondary | ICD-10-CM | POA: Diagnosis not present

## 2018-08-09 DIAGNOSIS — Z79899 Other long term (current) drug therapy: Secondary | ICD-10-CM | POA: Diagnosis not present

## 2018-08-09 DIAGNOSIS — M5136 Other intervertebral disc degeneration, lumbar region: Secondary | ICD-10-CM | POA: Diagnosis not present

## 2018-08-09 NOTE — Telephone Encounter (Signed)
Patient states that PCP has placed her on Vascepa and wants to know if this okay with Dr.Koneswaran/tg

## 2018-08-09 NOTE — Telephone Encounter (Signed)
That is fine 

## 2018-08-09 NOTE — Telephone Encounter (Signed)
Patient is able to get med for $33/month.

## 2018-08-10 ENCOUNTER — Other Ambulatory Visit: Payer: Self-pay | Admitting: *Deleted

## 2018-08-10 DIAGNOSIS — H903 Sensorineural hearing loss, bilateral: Secondary | ICD-10-CM | POA: Diagnosis not present

## 2018-08-10 DIAGNOSIS — R42 Dizziness and giddiness: Secondary | ICD-10-CM | POA: Diagnosis not present

## 2018-08-10 DIAGNOSIS — H838X3 Other specified diseases of inner ear, bilateral: Secondary | ICD-10-CM | POA: Diagnosis not present

## 2018-08-10 MED ORDER — KETOCONAZOLE 2 % EX CREA: TOPICAL_CREAM | CUTANEOUS | 1 refills | Status: DC

## 2018-08-10 NOTE — Telephone Encounter (Signed)
I will send to mail order.  Please cancel the one sent to local pharmacy

## 2018-08-12 ENCOUNTER — Encounter (INDEPENDENT_AMBULATORY_CARE_PROVIDER_SITE_OTHER): Payer: Medicare Other

## 2018-08-17 DIAGNOSIS — S42202D Unspecified fracture of upper end of left humerus, subsequent encounter for fracture with routine healing: Secondary | ICD-10-CM | POA: Diagnosis not present

## 2018-08-17 DIAGNOSIS — M19011 Primary osteoarthritis, right shoulder: Secondary | ICD-10-CM | POA: Diagnosis not present

## 2018-08-17 DIAGNOSIS — M19019 Primary osteoarthritis, unspecified shoulder: Secondary | ICD-10-CM

## 2018-08-17 HISTORY — DX: Primary osteoarthritis, unspecified shoulder: M19.019

## 2018-08-18 ENCOUNTER — Ambulatory Visit (INDEPENDENT_AMBULATORY_CARE_PROVIDER_SITE_OTHER): Payer: Medicare Other | Admitting: Bariatrics

## 2018-08-20 ENCOUNTER — Ambulatory Visit: Payer: Self-pay | Admitting: Pulmonary Disease

## 2018-08-20 DIAGNOSIS — H903 Sensorineural hearing loss, bilateral: Secondary | ICD-10-CM | POA: Diagnosis not present

## 2018-08-20 DIAGNOSIS — R42 Dizziness and giddiness: Secondary | ICD-10-CM | POA: Diagnosis not present

## 2018-08-23 ENCOUNTER — Telehealth: Payer: Self-pay | Admitting: Cardiovascular Disease

## 2018-08-23 NOTE — Telephone Encounter (Signed)
I called and spoke with the patient about potentially rescheduling her upcoming appointment due to the [place] pandemic.  Over the past 3 days she has had chest pains while lying down lasting for about 10 minutes.  She denies associated shortness of breath.  They are on the upper left side of her chest.  She denies acid reflux symptoms.  She said she has a long list of questions about her medications and would like to keep her upcoming appointment.  I plan on seeing her on September 02, 2018.

## 2018-08-24 DIAGNOSIS — E1142 Type 2 diabetes mellitus with diabetic polyneuropathy: Secondary | ICD-10-CM | POA: Diagnosis not present

## 2018-08-24 DIAGNOSIS — M79676 Pain in unspecified toe(s): Secondary | ICD-10-CM | POA: Diagnosis not present

## 2018-08-24 DIAGNOSIS — L84 Corns and callosities: Secondary | ICD-10-CM | POA: Diagnosis not present

## 2018-08-24 DIAGNOSIS — B351 Tinea unguium: Secondary | ICD-10-CM | POA: Diagnosis not present

## 2018-08-26 ENCOUNTER — Encounter (HOSPITAL_COMMUNITY): Payer: Medicare Other

## 2018-08-26 ENCOUNTER — Other Ambulatory Visit (HOSPITAL_COMMUNITY): Payer: Self-pay | Admitting: Psychiatry

## 2018-08-27 ENCOUNTER — Telehealth (INDEPENDENT_AMBULATORY_CARE_PROVIDER_SITE_OTHER): Payer: Medicare Other | Admitting: Cardiovascular Disease

## 2018-08-27 ENCOUNTER — Other Ambulatory Visit: Payer: Self-pay

## 2018-08-27 ENCOUNTER — Other Ambulatory Visit (HOSPITAL_COMMUNITY): Payer: Self-pay

## 2018-08-27 ENCOUNTER — Encounter: Payer: Self-pay | Admitting: Cardiovascular Disease

## 2018-08-27 VITALS — BP 99/70 | HR 64 | Ht 60.0 in | Wt 204.0 lb

## 2018-08-27 DIAGNOSIS — Z01818 Encounter for other preprocedural examination: Secondary | ICD-10-CM | POA: Diagnosis not present

## 2018-08-27 DIAGNOSIS — E782 Mixed hyperlipidemia: Secondary | ICD-10-CM

## 2018-08-27 DIAGNOSIS — I272 Pulmonary hypertension, unspecified: Secondary | ICD-10-CM

## 2018-08-27 DIAGNOSIS — I4821 Permanent atrial fibrillation: Secondary | ICD-10-CM | POA: Diagnosis not present

## 2018-08-27 DIAGNOSIS — I10 Essential (primary) hypertension: Secondary | ICD-10-CM

## 2018-08-27 DIAGNOSIS — I5032 Chronic diastolic (congestive) heart failure: Secondary | ICD-10-CM

## 2018-08-27 MED ORDER — LORAZEPAM 1 MG PO TABS: ORAL_TABLET | ORAL | 4 refills | Status: DC

## 2018-08-27 NOTE — Progress Notes (Signed)
Virtual Visit via Telephone Note    Evaluation Performed:  Follow-up visit  This visit type was conducted due to national recommendations for restrictions regarding the COVID-19 Pandemic (e.g. social distancing).  This format is felt to be most appropriate for this patient at this time.  All issues noted in this document were discussed and addressed.  No physical exam was performed (except for noted visual exam findings with Video Visits).  Please refer to the patient's chart (MyChart message for video visits and phone note for telephone visits) for the patient's consent to telehealth for [facility].  Date:  08/27/2018   ID:  Amber Stephenson, DOB 02-Jan-1946, MRN [id number]  Patient Location:  Home  Provider location:   [place], [place]  PCP:  [name], DO  Cardiologist:  Kate Sable, MD  Electrophysiologist:  None   Chief Complaint: Palpitations, Preoperative risk stratification   History of Present Illness:    Amber Stephenson is a 73 y.o. female who presents via audio/video conferencing for a telehealth visit today.    The patient does not symptoms concerning for COVID-19 infection (fever, chills, cough, or new shortness of breath).   Past medical history includes nonobstructive coronary artery disease, permanent atrial fibrillation, moderate persistent asthma, and chronic diastolic heart failure.  She had palpitations for about 4 to 5 days which have now subsided.  She has had a dry cough but denies fevers, chills, shortness of breath, and myalgias.  She does have seasonal allergies.  She has been on Vascepa for about 10 days.  She takes Lasix 20 mg twice daily and has not had to take any additional diuretic.  She saw an advertisement about the Watchman device which she wanted to discuss.  We talked about risks, benefits, and supportive data.  She also told me she had a form from her orthopedic surgeon regarding preoperative clearance for right shoulder  replacement which is currently being scheduled for sometime in either June or July but at this point she is not certain.   Prior CV studies:   The following studies were reviewed today:  Echocardiogram 02/03/2018: Normal left ventricular systolic function and regional wall motion, LVEF 60 to 65%, with severe left atrial and moderate to severe right atrial dilatation.  There was moderate pulmonary hypertension, 46 mmHg.  Cardiac catheterization 02/03/2018: Nonobstructive coronary artery disease with a proximal 40% LAD stenosis in mid LAD 30% stenosis.  Ostial first marginal to first marginal lesion was 30% stenosed.  LVEDP was moderately elevated.    Past Medical History:  Diagnosis Date   Atrial fibrillation ([place])    Bipolar affective ([place])    CHF (congestive heart failure) (HCC)    Chronic back pain    Diabetes mellitus without complication ([place])    Hyperlipidemia    Hypertension    Morbid obesity due to excess calories ([place]) 07/06/2018   Pain management    Renal insufficiency    Past Surgical History:  Procedure Laterality Date   BREAST REDUCTION SURGERY     EYE SURGERY Right    cateracts   HAMMER TOE SURGERY     LEFT HEART CATH AND CORONARY ANGIOGRAPHY N/A 02/03/2018   Procedure: LEFT HEART CATH AND CORONARY ANGIOGRAPHY;  Surgeon: Martinique, Peter M, MD;  Location: [facility];  Service: Cardiovascular;  Laterality: N/A;   SHOULDER SURGERY Right    THIGH SURGERY       Current Meds  Medication Sig   albuterol (PROVENTIL) (2.5 MG/3ML) 0.083% nebulizer  solution Take 3 mLs (2.5 mg total) by nebulization every 6 (six) hours as needed for wheezing or shortness of breath.   ARIPiprazole (ABILIFY) 10 MG tablet Take 3 tablets (30 mg total) by mouth daily.   atorvastatin (LIPITOR) 40 MG tablet TAKE 1 TABLET DAILY   Blood Glucose Monitoring Suppl (FREESTYLE LITE) DEVI Use to check blood sugar tid. DX E11.22   budesonide (ENTOCORT EC) 3 MG 24 hr capsule Take 9 mg by  mouth every morning.   budesonide-formoterol (SYMBICORT) 80-4.5 MCG/ACT inhaler Inhale 2 puffs into the lungs 2 (two) times daily.   Cholecalciferol (VITAMIN D3) 1000 UNITS CAPS Take 1,000 Units by mouth daily.   diltiazem (CARDIZEM CD) 120 MG 24 hr capsule Take 1 capsule (120 mg total) by mouth at bedtime.   Dulaglutide (TRULICITY) 1.5 MP/5.3IR SOPN Inject 1.5 mg into the skin once a week.   ELIQUIS 5 MG TABS tablet TAKE 1 TABLET TWICE A DAY (NEED TO BE SEEN BEFORE NEXT REFILL)   Eszopiclone (ESZOPICLONE) 3 MG TABS Take 1 tablet (3 mg total) by mouth at bedtime as needed. Take immediately before bedtime   furosemide (LASIX) 20 MG tablet Take 1 tablet (20 mg total) by mouth 2 (two) times daily.   glucose blood (FREESTYLE LITE) test strip USE FOR TESTING SUGAR THREE TIMES DAILY   Icosapent Ethyl (VASCEPA) 1 g CAPS Take 2 capsules (2 g total) by mouth 2 (two) times daily with a meal.   Insulin Glargine (LANTUS SOLOSTAR) 100 UNIT/ML Solostar Pen INJECT 60 UNITS UNDER THE SKIN TWICE A DAY (Patient taking differently: Inject 60 Units into the skin 2 (two) times daily. )   Insulin Pen Needle (SURE COMFORT PEN NEEDLES) 32G X 4 MM MISC Use with insulin pens as directed. Dx E11.9   ketoconazole (NIZORAL) 2 % cream Apply to affected areas in groin region ONE time daily for 3-4 weeks.   Lancets (FREESTYLE) lancets Test tid. DX E11.22   LORazepam (ATIVAN) 1 MG tablet Take 1 tab (1 mg) in the morning and 2 tabs (2 mg ) at bedtime   metoprolol tartrate (LOPRESSOR) 100 MG tablet Take 1 tablet (100 mg total) by mouth 2 (two) times daily. Take with metoprolol 25 mg to equal 125 mg   metoprolol tartrate (LOPRESSOR) 25 MG tablet Take 1 tablet (25 mg total) by mouth 2 (two) times daily. Take with metoprolol 100 mg to equal 125 mg   naloxone (NARCAN) nasal spray 4 mg/0.1 mL Place 1 spray into the nose once as needed (opiod overdose).   pregabalin (LYRICA) 150 MG capsule Take 1 capsule (150 mg total)  by mouth 2 (two) times daily.   rizatriptan (MAXALT-MLT) 10 MG disintegrating tablet DISSOLVE UNDER TONGUE AT FIRST SIGN OF HEADACHE MAY REPEAT IN 1 HR AS NEEDED   topiramate (TOPAMAX) 25 MG tablet Take 1 tablet every night for 1 week, then increase to 2 tablets every night for 1 week, then increase to 3 tablets every night and continue   traZODone (DESYREL) 100 MG tablet 4  qhs     Allergies:   Ivp dye [iodinated diagnostic agents]   Social History   Tobacco Use   Smoking status: Never Smoker   Smokeless tobacco: Never Used  Substance Use Topics   Alcohol use: No    Alcohol/week: 0.0 standard drinks   Drug use: No     Family Hx: The patient's family history includes Alcohol abuse in her sister; Diabetes in her brother and mother; Heart disease in  her brother, mother, and sister; Mental illness in her brother.  ROS:   Please see the history of present illness.     All other systems reviewed and are negative.   Labs/Other Tests and Data Reviewed:    Recent Labs: 10/16/2017: B Natriuretic Peptide 185.0 03/05/2018: Hemoglobin 14.2; Platelets 185 04/09/2018: ALT 12; BUN 15; Creatinine, Ser 0.98; Magnesium 1.8; Potassium 4.2; Sodium 143   Recent Lipid Panel Lab Results  Component Value Date/Time   CHOL 163 01/04/2015 10:28 AM   TRIG 249 (H) 01/04/2015 10:28 AM   HDL 42 01/04/2015 10:28 AM   CHOLHDL 3.9 01/04/2015 10:28 AM   LDLCALC 71 01/04/2015 10:28 AM   LDLDIRECT 62 03/17/2016 04:14 PM    Wt Readings from Last 3 Encounters:  08/27/18 204 lb (92.5 kg)  08/06/18 208 lb (94.3 kg)  08/04/18 207 lb (93.9 kg)     Exam:    Vital Signs:  BP 99/70    Pulse 64    Ht 5' (1.524 m)    Wt 204 lb (92.5 kg)    HC 62" (157.5 cm)    BMI 39.84 kg/m     ASSESSMENT & PLAN:    1. Permanent atrial fibrillation:Symptomatically stable. Continuelong acting diltiazem 120 mg dailyandmetoprolol 125 mg twice daily. Anticoagulated withapixaban.  I informed her that she would  not be a good candidate for the Watchman device with respect to her question.  2. Chronic diastolic heart failure:Symptomatically stable. LVEDP was elevated at the time of cath.  Currently takes Lasix 20 mg twice daily.I told her she can take Lasix as needed for increased swelling, dyspnea, and weight gain.  3. Hypertension: Controlled. No changes.  4. Hyperlipidemia: Continue Lipitor 40 mg.  She recently started Vascepa.  5.  Pulmonary hypertension: Aggressive weight loss has been recommended.  No role for pulmonary vasodilators.  She is also being treated for moderate persistent asthma.  6.  Preoperative risk stratification: I informed the patient that her orthopedic surgeon's office can fax [place] a form prior to surgery being scheduled.   COVID-19 Education: The signs and symptoms of COVID-19 were discussed with the patient and how to seek care for testing (follow up with PCP or arrange E-visit).  The importance of social distancing was discussed today.  Patient Risk:   After full review of this patients clinical status, I feel that they are at least moderate risk at this time.  Time:   Today, I have spent 15 minutes with the patient with telehealth technology discussing permanent atrial fibrillation management, chronic diastolic heart failure management, and preoperative risk stratification.     Medication Adjustments/Labs and Tests Ordered: Current medicines are reviewed at length with the patient today.  Concerns regarding medicines are outlined above.  Tests Ordered: No orders of the defined types were placed in this encounter.  Medication Changes: No orders of the defined types were placed in this encounter.   Disposition:  Follow up 6 months  Signed, Kate Sable, MD  08/27/2018 1:58 PM     [facility]

## 2018-08-27 NOTE — Patient Instructions (Signed)

## 2018-09-01 ENCOUNTER — Ambulatory Visit (INDEPENDENT_AMBULATORY_CARE_PROVIDER_SITE_OTHER): Payer: Medicare Other | Admitting: Bariatrics

## 2018-09-02 ENCOUNTER — Ambulatory Visit: Payer: Self-pay | Admitting: Cardiovascular Disease

## 2018-09-06 ENCOUNTER — Other Ambulatory Visit (HOSPITAL_COMMUNITY): Payer: Self-pay

## 2018-09-06 DIAGNOSIS — M545 Low back pain: Secondary | ICD-10-CM | POA: Diagnosis not present

## 2018-09-06 DIAGNOSIS — Z79899 Other long term (current) drug therapy: Secondary | ICD-10-CM | POA: Diagnosis not present

## 2018-09-06 DIAGNOSIS — M5136 Other intervertebral disc degeneration, lumbar region: Secondary | ICD-10-CM | POA: Diagnosis not present

## 2018-09-06 DIAGNOSIS — R300 Dysuria: Secondary | ICD-10-CM | POA: Diagnosis not present

## 2018-09-07 ENCOUNTER — Encounter: Payer: Self-pay | Admitting: Physician Assistant

## 2018-09-07 ENCOUNTER — Ambulatory Visit: Payer: Self-pay | Admitting: Neurology

## 2018-09-07 ENCOUNTER — Other Ambulatory Visit: Payer: Self-pay

## 2018-09-07 ENCOUNTER — Ambulatory Visit (INDEPENDENT_AMBULATORY_CARE_PROVIDER_SITE_OTHER): Payer: Medicare Other | Admitting: Physician Assistant

## 2018-09-07 DIAGNOSIS — R300 Dysuria: Secondary | ICD-10-CM | POA: Diagnosis not present

## 2018-09-07 NOTE — Progress Notes (Signed)
Telephone visit  Subjective: SN:[id number] PCP: [name], DO HAL:[id number] D Kulesza is a 73 y.o. female calls for telephone consult today. Patient provides verbal consent for consult held via phone.  Patient is identified with 2 separate identifiers.  At this time the entire area is on COVID-19 social distancing and stay home orders are in place.  Patient is of higher risk and therefore we are performing this by a virtual method.  Location of patient: home Location of provider: [facility] Others present for call: no  Over the past 2 days the patient had had some dysuria.  She was seen through a specialist 1 day ago and a drug screen was performed.  They noticed that the urine was very concentrated.  They did run a urinalysis on this and told her that she does not have an infection.  She denies any fever or chills.  They stated that she was quite dehydrated.  So she is greatly increased her water intake over the past day and her urine has returned back to normal color.  She denies any other symptoms at this time.   ROS: Per HPI  Allergies  Allergen Reactions  . Ivp Dye [Iodinated Diagnostic Agents] Anaphylaxis and Swelling    Throat closes   Past Medical History:  Diagnosis Date  . Atrial fibrillation ([place])   . Bipolar affective ([place])   . CHF (congestive heart failure) ([place])   . Chronic back pain   . Diabetes mellitus without complication ([place])   . Hyperlipidemia   . Hypertension   . Morbid obesity due to excess calories ([place]) 07/06/2018  . Pain management   . Renal insufficiency     Current Outpatient Medications:  .  albuterol (PROVENTIL) (2.5 MG/3ML) 0.083% nebulizer solution, Take 3 mLs (2.5 mg total) by nebulization every 6 (six) hours as needed for wheezing or shortness of breath., Disp: 75 mL, Rfl: 1 .  ARIPiprazole (ABILIFY) 10 MG tablet, Take 3 tablets (30 mg total) by mouth daily., Disp: 90 tablet, Rfl: 4 .  atorvastatin (LIPITOR) 40 MG tablet, TAKE 1 TABLET DAILY,  Disp: 90 tablet, Rfl: 1 .  Blood Glucose Monitoring Suppl (FREESTYLE LITE) DEVI, Use to check blood sugar tid. DX E11.22, Disp: 1 each, Rfl: 0 .  budesonide (ENTOCORT EC) 3 MG 24 hr capsule, Take 9 mg by mouth every morning., Disp: , Rfl:  .  budesonide-formoterol (SYMBICORT) 80-4.5 MCG/ACT inhaler, Inhale 2 puffs into the lungs 2 (two) times daily., Disp: 2 Inhaler, Rfl: 0 .  Cholecalciferol (VITAMIN D3) 1000 UNITS CAPS, Take 1,000 Units by mouth daily., Disp: , Rfl:  .  diltiazem (CARDIZEM CD) 120 MG 24 hr capsule, Take 1 capsule (120 mg total) by mouth at bedtime., Disp: 90 capsule, Rfl: 3 .  Dulaglutide (TRULICITY) 1.5 IO/9.7DZ SOPN, Inject 1.5 mg into the skin once a week., Disp: 12 pen, Rfl: 3 .  ELIQUIS 5 MG TABS tablet, TAKE 1 TABLET TWICE A DAY (NEED TO BE SEEN BEFORE NEXT REFILL), Disp: 180 tablet, Rfl: 1 .  Eszopiclone (ESZOPICLONE) 3 MG TABS, Take 1 tablet (3 mg total) by mouth at bedtime as needed. Take immediately before bedtime, Disp: 30 tablet, Rfl: 4 .  furosemide (LASIX) 20 MG tablet, Take 1 tablet (20 mg total) by mouth 2 (two) times daily., Disp: 180 tablet, Rfl: 1 .  glucose blood (FREESTYLE LITE) test strip, USE FOR TESTING SUGAR THREE TIMES DAILY, Disp: 300 each, Rfl: 3 .  Icosapent Ethyl (VASCEPA) 1 g CAPS, Take  2 capsules (2 g total) by mouth 2 (two) times daily with a meal., Disp: 120 capsule, Rfl: 12 .  Insulin Glargine (LANTUS SOLOSTAR) 100 UNIT/ML Solostar Pen, INJECT 60 UNITS UNDER THE SKIN TWICE A DAY (Patient taking differently: Inject 60 Units into the skin 2 (two) times daily. ), Disp: 90 mL, Rfl: 0 .  Insulin Pen Needle (SURE COMFORT PEN NEEDLES) 32G X 4 MM MISC, Use with insulin pens as directed. Dx E11.9, Disp: 300 each, Rfl: 11 .  ketoconazole (NIZORAL) 2 % cream, Apply to affected areas in groin region ONE time daily for 3-4 weeks., Disp: 60 g, Rfl: 1 .  Lancets (FREESTYLE) lancets, Test tid. DX E11.22, Disp: 100 each, Rfl: 5 .  LORazepam (ATIVAN) 1 MG tablet,  Take 1 tab (1 mg) in the morning and 2 tabs (2 mg ) at bedtime, Disp: 90 tablet, Rfl: 4 .  metoprolol tartrate (LOPRESSOR) 100 MG tablet, Take 1 tablet (100 mg total) by mouth 2 (two) times daily. Take with metoprolol 25 mg to equal 125 mg, Disp: 28 tablet, Rfl: 0 .  metoprolol tartrate (LOPRESSOR) 25 MG tablet, Take 1 tablet (25 mg total) by mouth 2 (two) times daily. Take with metoprolol 100 mg to equal 125 mg, Disp: 28 tablet, Rfl: 0 .  naloxone (NARCAN) nasal spray 4 mg/0.1 mL, Place 1 spray into the nose once as needed (opiod overdose)., Disp: , Rfl:  .  pregabalin (LYRICA) 150 MG capsule, Take 1 capsule (150 mg total) by mouth 2 (two) times daily., Disp: 180 capsule, Rfl: 4 .  rizatriptan (MAXALT-MLT) 10 MG disintegrating tablet, DISSOLVE UNDER TONGUE AT FIRST SIGN OF HEADACHE MAY REPEAT IN 1 HR AS NEEDED, Disp: 10 tablet, Rfl: 2 .  topiramate (TOPAMAX) 25 MG tablet, Take 1 tablet every night for 1 week, then increase to 2 tablets every night for 1 week, then increase to 3 tablets every night and continue, Disp: 90 tablet, Rfl: 11 .  traZODone (DESYREL) 100 MG tablet, 4  qhs, Disp: 120 tablet, Rfl: 4  Assessment/ Plan: 73 y.o. female   Dysuria Continue good hydration  call back if symptoms return   Start time: 1:01 pm End time: 1:10 PM  No orders of the defined types were placed in this encounter.   Particia Nearing PA-C [place] [phone]

## 2018-09-08 ENCOUNTER — Other Ambulatory Visit (HOSPITAL_COMMUNITY): Payer: Self-pay | Admitting: Psychiatry

## 2018-09-11 ENCOUNTER — Other Ambulatory Visit: Payer: Self-pay | Admitting: Family Medicine

## 2018-09-14 DIAGNOSIS — M79672 Pain in left foot: Secondary | ICD-10-CM | POA: Diagnosis not present

## 2018-09-14 DIAGNOSIS — M722 Plantar fascial fibromatosis: Secondary | ICD-10-CM | POA: Diagnosis not present

## 2018-09-21 DIAGNOSIS — M722 Plantar fascial fibromatosis: Secondary | ICD-10-CM | POA: Diagnosis not present

## 2018-09-22 ENCOUNTER — Other Ambulatory Visit (HOSPITAL_COMMUNITY): Payer: Self-pay

## 2018-09-22 ENCOUNTER — Encounter (HOSPITAL_COMMUNITY): Payer: Self-pay | Admitting: Psychiatry

## 2018-09-22 ENCOUNTER — Ambulatory Visit: Payer: Self-pay | Admitting: Pulmonary Disease

## 2018-09-22 ENCOUNTER — Other Ambulatory Visit: Payer: Self-pay

## 2018-09-22 ENCOUNTER — Ambulatory Visit (INDEPENDENT_AMBULATORY_CARE_PROVIDER_SITE_OTHER): Payer: Medicare Other | Admitting: Psychiatry

## 2018-09-22 DIAGNOSIS — F3110 Bipolar disorder, current episode manic without psychotic features, unspecified: Secondary | ICD-10-CM | POA: Diagnosis not present

## 2018-09-22 MED ORDER — LORAZEPAM 1 MG PO TABS: ORAL_TABLET | ORAL | 4 refills | Status: DC

## 2018-09-22 MED ORDER — ARIPIPRAZOLE 10 MG PO TABS: 30.0000 mg | ORAL_TABLET | Freq: Every day | ORAL | 4 refills | Status: DC

## 2018-09-22 MED ORDER — TRAZODONE HCL 100 MG PO TABS: ORAL_TABLET | ORAL | 5 refills | Status: DC

## 2018-09-22 MED ORDER — CARBAMAZEPINE ER 100 MG PO CP12: 100.0000 mg | ORAL_CAPSULE | Freq: Every day | ORAL | 5 refills | Status: DC

## 2018-09-22 MED ORDER — ESZOPICLONE 3 MG PO TABS: 3.0000 mg | ORAL_TABLET | Freq: Every evening | ORAL | 4 refills | Status: DC | PRN

## 2018-09-22 NOTE — Progress Notes (Signed)
Patient ID: Amber Stephenson, female   DOB: Feb 14, 1946, 73 y.o.   MRN: [id number] [facility] MD Progress Note .she's going to apply 09/22/2018 3:24 PM Amber Stephenson  MRN:  [id number] Subjective:  Doing Stephenson Principal Problem: Bipolar disorder manic type Diagnosis: Bipolar disorder  Today the patient is doing amazingly Stephenson.  The pandemic does not seem to bother her.  She has her daughter visiting who is 63 years old and keeps her very occupied.  She enjoys her family a great deal.  Her husband is doing Stephenson.  The patient's mood is good.  She describes it even and cough.  She shows no evidence of depression or any type of mania.  She is sleeping and eating very Stephenson.  She is got good energy.  She is enjoying life.  She loves the TV and she dislikes the place she says.  She drinks no alcohol uses no drugs.  She is had no falls.  She denies any physical complaints.  She is happy and engaging.  The patient denies racing thinking or grandiosity.  She takes her medicines just as prescribed.  She has no evidence of fatigue.  She is functioning extremely Stephenson.  This combination of medicine seems to work Stephenson.  We clarified that she is actually taking 100 mg of Tegretol at night although is not in her chart.  We will clarify this.  Patient takes her Ativan just as prescribed.   Past Medical History:  Diagnosis Date  . Atrial fibrillation ([place])   . Bipolar affective ([place])   . CHF (congestive heart failure) ([place])   . Chronic back pain   . Diabetes mellitus without complication ([place])   . Hyperlipidemia   . Hypertension   . Morbid obesity due to excess calories ([facility]) 07/06/2018  . Pain management   . Renal insufficiency     Past Surgical History:  Procedure Laterality Date  . BREAST REDUCTION SURGERY    . EYE SURGERY Right    cateracts  . HAMMER TOE SURGERY    . LEFT HEART CATH AND CORONARY ANGIOGRAPHY N/A 02/03/2018   Procedure: LEFT HEART CATH AND CORONARY ANGIOGRAPHY;  Surgeon: Martinique, Peter M, MD;   Location: [facility];  Service: Cardiovascular;  Laterality: N/A;  . SHOULDER SURGERY Right   . THIGH SURGERY     Family History:  Family History  Problem Relation Age of Onset  . Diabetes Mother   . Heart disease Mother   . Heart disease Brother   . Heart disease Sister        CABG  . Alcohol abuse Sister   . Mental illness Brother   . Diabetes Brother    Family Psychiatric  History:  Social History:  Social History   Substance and Sexual Activity  Alcohol Use No  . Alcohol/week: 0.0 standard drinks     Social History   Substance and Sexual Activity  Drug Use No    Social History   Socioeconomic History  . Marital status: Married    Spouse name: Not on file  . Number of children: 3  . Years of education: 16  . Highest education level: Bachelor's degree (e.g., BA, AB, BS)  Occupational History  . Not on file  Social Needs  . Financial resource strain: Not hard at all  . Food insecurity:    Worry: Never true    Inability: Never true  . Transportation needs:    Medical: No    Non-medical: No  Tobacco Use  . Smoking status: Never Smoker  . Smokeless tobacco: Never Used  Substance and Sexual Activity  . Alcohol use: No    Alcohol/week: 0.0 standard drinks  . Drug use: No  . Sexual activity: Yes    Partners: Male    Birth control/protection: Post-menopausal  Lifestyle  . Physical activity:    Days per week: 0 days    Minutes per session: 0 min  . Stress: Not at all  Relationships  . Social connections:    Talks on phone: More than three times a week    Gets together: Once a week    Attends religious service: More than 4 times per year    Active member of club or organization: No    Attends meetings of clubs or organizations: Never    Relationship status: Married  Other Topics Concern  . Not on file  Social History Narrative   Lives at home with husband.    Additional Social History:                         Sleep:  Good  Appetite:  Good  Current Medications: Current Outpatient Medications  Medication Sig Dispense Refill  . albuterol (PROVENTIL) (2.5 MG/3ML) 0.083% nebulizer solution Take 3 mLs (2.5 mg total) by nebulization every 6 (six) hours as needed for wheezing or shortness of breath. 75 mL 1  . ARIPiprazole (ABILIFY) 10 MG tablet Take 3 tablets (30 mg total) by mouth daily. 90 tablet 4  . atorvastatin (LIPITOR) 40 MG tablet TAKE 1 TABLET DAILY 90 tablet 0  . Blood Glucose Monitoring Suppl (FREESTYLE LITE) DEVI Use to check blood sugar tid. DX E11.22 1 each 0  . budesonide (ENTOCORT EC) 3 MG 24 hr capsule Take 9 mg by mouth every morning.    . budesonide-formoterol (SYMBICORT) 80-4.5 MCG/ACT inhaler Inhale 2 puffs into the lungs 2 (two) times daily. 2 Inhaler 0  . carbamazepine (CARBATROL) 100 MG 12 hr capsule Take 1 capsule (100 mg total) by mouth daily. 30 capsule 5  . Cholecalciferol (VITAMIN D3) 1000 UNITS CAPS Take 1,000 Units by mouth daily.    Marland Kitchen diltiazem (CARDIZEM CD) 120 MG 24 hr capsule Take 1 capsule (120 mg total) by mouth at bedtime. 90 capsule 3  . Dulaglutide (TRULICITY) 1.5 ZO/1.0RU SOPN Inject 1.5 mg into the skin once a week. 12 pen 3  . ELIQUIS 5 MG TABS tablet TAKE 1 TABLET TWICE A DAY (NEED TO BE SEEN BEFORE NEXT REFILL) 180 tablet 1  . Eszopiclone (ESZOPICLONE) 3 MG TABS Take 1 tablet (3 mg total) by mouth at bedtime as needed. Take immediately before bedtime 30 tablet 4  . furosemide (LASIX) 20 MG tablet Take 1 tablet (20 mg total) by mouth 2 (two) times daily. 180 tablet 1  . glucose blood (FREESTYLE LITE) test strip USE FOR TESTING SUGAR THREE TIMES DAILY 300 each 3  . Icosapent Ethyl (VASCEPA) 1 g CAPS Take 2 capsules (2 g total) by mouth 2 (two) times daily with a meal. 120 capsule 12  . Insulin Glargine (LANTUS SOLOSTAR) 100 UNIT/ML Solostar Pen INJECT 60 UNITS UNDER THE SKIN TWICE A DAY (Patient taking differently: Inject 60 Units into the skin 2 (two) times daily. ) 90  mL 0  . Insulin Pen Needle (SURE COMFORT PEN NEEDLES) 32G X 4 MM MISC Use with insulin pens as directed. Dx E11.9 300 each 11  . ketoconazole (NIZORAL) 2 % cream Apply  to affected areas in groin region ONE time daily for 3-4 weeks. 60 g 1  . Lancets (FREESTYLE) lancets Test tid. DX E11.22 100 each 5  . LORazepam (ATIVAN) 1 MG tablet Take 1 tab (1 mg) in the morning and 2 tabs (2 mg ) at bedtime 90 tablet 4  . metoprolol tartrate (LOPRESSOR) 100 MG tablet Take 1 tablet (100 mg total) by mouth 2 (two) times daily. Take with metoprolol 25 mg to equal 125 mg 28 tablet 0  . metoprolol tartrate (LOPRESSOR) 25 MG tablet Take 1 tablet (25 mg total) by mouth 2 (two) times daily. Take with metoprolol 100 mg to equal 125 mg 28 tablet 0  . naloxone (NARCAN) nasal spray 4 mg/0.1 mL Place 1 spray into the nose once as needed (opiod overdose).    . pregabalin (LYRICA) 150 MG capsule Take 1 capsule (150 mg total) by mouth 2 (two) times daily. 180 capsule 4  . rizatriptan (MAXALT-MLT) 10 MG disintegrating tablet DISSOLVE UNDER TONGUE AT FIRST SIGN OF HEADACHE MAY REPEAT IN 1 HR AS NEEDED 10 tablet 2  . topiramate (TOPAMAX) 25 MG tablet Take 1 tablet every night for 1 week, then increase to 2 tablets every night for 1 week, then increase to 3 tablets every night and continue 90 tablet 11  . traZODone (DESYREL) 100 MG tablet 4  qhs 120 tablet 5   No current facility-administered medications for this visit.     Lab Results: No results found for this or any previous visit (from the past 48 hour(s)).  Blood Alcohol level:  Lab Results  Component Value Date   ETH <5 10/01/2015    Physical Findings: AIMS:  , ,  ,  ,    CIWA:    COWS:     Musculoskeletal: Strength & Muscle Tone: within normal limits Gait & Station: normal Patient leans: N/A  Psychiatric Specialty Exam: ROS Patient ID: ANITRA DOXTATER, female   DOB: February 23, 1946, 73 y.o.   MRN: [id number] [facility] MD Progress Note .she's going to  apply 09/22/2018 3:24 PM AVIN UPPERMAN  MRN:  [id number] Subjective:  Doing Stephenson Principal Problem: Bipolar disorder most recent episode manic Diagnosis:  Bipolar disorder most recent   Today the patient is seen with her husband. Actually this is about a month earlier than expected. The actually having no new problems. The patient does describe her anxiety level is somewhat higher bullous suspect is related to the fact that her daughter who is recovering out of his living with them now. Apparently does some degree of tension between her and her daughter who she is with every day. Patient also has multiple medical problems.She seen doctors every week having tests. The patient denies depression. She denies any signs of mania. She's not irritable. She is not 4. She does not have racing thinking. Her appetite is normal. She does complain other sleep is not for her. She's having hard time staying asleep. She goes to sleep and takes 3 trazodone. Patient is no evidence of psychosis. She is not grandiose. She's not suicidal. Her husband is stable. They likely home they live in. The patient takes her medicines as prescribed. The patient drinks no alcohol uses no drugs. They're planning a trip to [place]. The patient shows no evidence of tardive dyskinesia. Past Medical History:  Diagnosis Date  . Atrial fibrillation ([place])   . Bipolar affective ([place])   . CHF (congestive heart failure) ([place])   . Chronic back pain   .  Diabetes mellitus without complication ([place])   . Hyperlipidemia   . Hypertension   . Morbid obesity due to excess calories ([place]) 07/06/2018  . Pain management   . Renal insufficiency     Past Surgical History:  Procedure Laterality Date  . BREAST REDUCTION SURGERY    . EYE SURGERY Right    cateracts  . HAMMER TOE SURGERY    . LEFT HEART CATH AND CORONARY ANGIOGRAPHY N/A 02/03/2018   Procedure: LEFT HEART CATH AND CORONARY ANGIOGRAPHY;  Surgeon: Martinique, Peter M, MD;  Location: [facility];  Service: Cardiovascular;  Laterality: N/A;  . SHOULDER SURGERY Right   . THIGH SURGERY     Family History:  Family History  Problem Relation Age of Onset  . Diabetes Mother   . Heart disease Mother   . Heart disease Brother   . Heart disease Sister        CABG  . Alcohol abuse Sister   . Mental illness Brother   . Diabetes Brother    Family Psychiatric  History:  Social History:  Social History   Substance and Sexual Activity  Alcohol Use No  . Alcohol/week: 0.0 standard drinks     Social History   Substance and Sexual Activity  Drug Use No    Social History   Socioeconomic History  . Marital status: Married    Spouse name: Not on file  . Number of children: 3  . Years of education: 16  . Highest education level: Bachelor's degree (e.g., BA, AB, BS)  Occupational History  . Not on file  Social Needs  . Financial resource strain: Not hard at all  . Food insecurity:    Worry: Never true    Inability: Never true  . Transportation needs:    Medical: No    Non-medical: No  Tobacco Use  . Smoking status: Never Smoker  . Smokeless tobacco: Never Used  Substance and Sexual Activity  . Alcohol use: No    Alcohol/week: 0.0 standard drinks  . Drug use: No  . Sexual activity: Yes    Partners: Male    Birth control/protection: Post-menopausal  Lifestyle  . Physical activity:    Days per week: 0 days    Minutes per session: 0 min  . Stress: Not at all  Relationships  . Social connections:    Talks on phone: More than three times a week    Gets together: Once a week    Attends religious service: More than 4 times per year    Active member of club or organization: No    Attends meetings of clubs or organizations: Never    Relationship status: Married  Other Topics Concern  . Not on file  Social History Narrative   Lives at home with husband.    Additional Social History:                         Sleep: Good  Appetite:   Good  Current Medications: Current Outpatient Medications  Medication Sig Dispense Refill  . albuterol (PROVENTIL) (2.5 MG/3ML) 0.083% nebulizer solution Take 3 mLs (2.5 mg total) by nebulization every 6 (six) hours as needed for wheezing or shortness of breath. 75 mL 1  . ARIPiprazole (ABILIFY) 10 MG tablet Take 3 tablets (30 mg total) by mouth daily. 90 tablet 4  . atorvastatin (LIPITOR) 40 MG tablet TAKE 1 TABLET DAILY 90 tablet 0  . Blood Glucose Monitoring Suppl (  FREESTYLE LITE) DEVI Use to check blood sugar tid. DX E11.22 1 each 0  . budesonide (ENTOCORT EC) 3 MG 24 hr capsule Take 9 mg by mouth every morning.    . budesonide-formoterol (SYMBICORT) 80-4.5 MCG/ACT inhaler Inhale 2 puffs into the lungs 2 (two) times daily. 2 Inhaler 0  . carbamazepine (CARBATROL) 100 MG 12 hr capsule Take 1 capsule (100 mg total) by mouth daily. 30 capsule 5  . Cholecalciferol (VITAMIN D3) 1000 UNITS CAPS Take 1,000 Units by mouth daily.    Marland Kitchen diltiazem (CARDIZEM CD) 120 MG 24 hr capsule Take 1 capsule (120 mg total) by mouth at bedtime. 90 capsule 3  . Dulaglutide (TRULICITY) 1.5 WE/3.1VQ SOPN Inject 1.5 mg into the skin once a week. 12 pen 3  . ELIQUIS 5 MG TABS tablet TAKE 1 TABLET TWICE A DAY (NEED TO BE SEEN BEFORE NEXT REFILL) 180 tablet 1  . Eszopiclone (ESZOPICLONE) 3 MG TABS Take 1 tablet (3 mg total) by mouth at bedtime as needed. Take immediately before bedtime 30 tablet 4  . furosemide (LASIX) 20 MG tablet Take 1 tablet (20 mg total) by mouth 2 (two) times daily. 180 tablet 1  . glucose blood (FREESTYLE LITE) test strip USE FOR TESTING SUGAR THREE TIMES DAILY 300 each 3  . Icosapent Ethyl (VASCEPA) 1 g CAPS Take 2 capsules (2 g total) by mouth 2 (two) times daily with a meal. 120 capsule 12  . Insulin Glargine (LANTUS SOLOSTAR) 100 UNIT/ML Solostar Pen INJECT 60 UNITS UNDER THE SKIN TWICE A DAY (Patient taking differently: Inject 60 Units into the skin 2 (two) times daily. ) 90 mL 0  . Insulin Pen  Needle (SURE COMFORT PEN NEEDLES) 32G X 4 MM MISC Use with insulin pens as directed. Dx E11.9 300 each 11  . ketoconazole (NIZORAL) 2 % cream Apply to affected areas in groin region ONE time daily for 3-4 weeks. 60 g 1  . Lancets (FREESTYLE) lancets Test tid. DX E11.22 100 each 5  . LORazepam (ATIVAN) 1 MG tablet Take 1 tab (1 mg) in the morning and 2 tabs (2 mg ) at bedtime 90 tablet 4  . metoprolol tartrate (LOPRESSOR) 100 MG tablet Take 1 tablet (100 mg total) by mouth 2 (two) times daily. Take with metoprolol 25 mg to equal 125 mg 28 tablet 0  . metoprolol tartrate (LOPRESSOR) 25 MG tablet Take 1 tablet (25 mg total) by mouth 2 (two) times daily. Take with metoprolol 100 mg to equal 125 mg 28 tablet 0  . naloxone (NARCAN) nasal spray 4 mg/0.1 mL Place 1 spray into the nose once as needed (opiod overdose).    . pregabalin (LYRICA) 150 MG capsule Take 1 capsule (150 mg total) by mouth 2 (two) times daily. 180 capsule 4  . rizatriptan (MAXALT-MLT) 10 MG disintegrating tablet DISSOLVE UNDER TONGUE AT FIRST SIGN OF HEADACHE MAY REPEAT IN 1 HR AS NEEDED 10 tablet 2  . topiramate (TOPAMAX) 25 MG tablet Take 1 tablet every night for 1 week, then increase to 2 tablets every night for 1 week, then increase to 3 tablets every night and continue 90 tablet 11  . traZODone (DESYREL) 100 MG tablet 4  qhs 120 tablet 5   No current facility-administered medications for this visit.     Lab Results: No results found for this or any previous visit (from the past 48 hour(s)).  Blood Alcohol level:  Lab Results  Component Value Date   [facility] <5 10/01/2015  Physical Findings: AIMS:  , ,  ,  ,    CIWA:    COWS:     Musculoskeletal: Strength & Muscle Tone: within normal limits Gait & Station: normal Patient leans: N/A  Psychiatric Specialty Exam: ROS  There were no vitals taken for this visit.There is no height or weight on file to calculate BMI.  General Appearance: Casual  Eye Contact::  Good   Speech:  Clear and Coherent  Volume:  Normal  Mood:  Euthymic  Affect:  Appropriate  Thought Process:  Coherent  Orientation:  Full (Time, Place, and Person)  Thought Content:  WDL  Suicidal Thoughts:  No  Homicidal Thoughts:  No  Memory:  NA  Judgement:  Good  Insight:  Good  Psychomotor Activity:  Normal  Concentration:  Good  Recall:  Good  Fund of Knowledge:Good  Language: Good  Akathisia:  No  Handed:  Right  AIMS (if indicated):     Assets: Good spirits   ADL's:  Intact  Cognition: WNL  Sleep:     eks Treatment Plan Summary: This patient's first admission important problem is bipolar disorder. She takes multiple medications for this condition. This includes Tegretol and Abilify. At this time the patient is not in therapy. She'll make an attempt to get a therapist in her community. She's taking a moderate to high dose of Ativan 1 mg one in the morning and 2 at night and requested more of it. At this time we will not increase the dose of Ativan. We will have her response to her second problem which is her problems sleeping. She takes 3 trazodone to sleep and doesn't stay asleep. At this time she'll continue taking the trazodone but she'll begin on doxepin 50 mg in addition. Today we spent more than 50% of the time talking about her medications and educating her. She understands that she cannot drink alcohol with her medications and she'll actually does not. The patient stays busy and active. She does importance of being in therapy and she'll actively looking for a therapist at this time. She'll return to see me in 3 months for a 30 minute visit. At that time consider getting a Tegretol blood level and other blood work.patient denies any physical complaints of chest pain shortness of breath.  There were no vitals taken for this visit.There is no height or weight on file to calculate BMI.  General Appearance: Casual  Eye Contact::  Good  Speech:  Clear and Coherent  Volume:  Normal   Mood:  Euthymic  Affect:  Appropriate  Thought Process:  Coherent  Orientation:  Full (Time, Place, and Person)  Thought Content:  WDL  Suicidal Thoughts:  No  Homicidal Thoughts:  No  Memory:  NA  Judgement:  Good  Insight:  Good  Psychomotor Activity:  Normal  Concentration:  Good  Recall:  Good  Fund of Knowledge:Good  Language: Good  Akathisia:  No  Handed:  Right  AIMS (if indicated):     Assets: Good spirits   ADL's:  Intact  Cognition: WNL  Sleep:     eks Treatment Plan Summary:  This patient is #1 problem is bipolar disorder manic type.  It is Stephenson controlled on high-dose Abilify 30 mg together with Tegretol 100 mg at night.  Her second problem seems to be insomnia.  She does very Stephenson taking 400 mg of trazodone together with 3 mg of Lunesta.  The patient also takes for anxiety Ativan 1 mg 1  in the morning and 2 at night.  The patient is stable on all these medications.  Her next visit we will go ahead and get some blood work.  The patient is functioning Stephenson.  She shows no signs of psychosis.  She is not suicidal.  She will return to see [place] in 3 months.

## 2018-09-23 DIAGNOSIS — N183 Chronic kidney disease, stage 3 (moderate): Secondary | ICD-10-CM | POA: Diagnosis not present

## 2018-09-23 DIAGNOSIS — E1165 Type 2 diabetes mellitus with hyperglycemia: Secondary | ICD-10-CM | POA: Diagnosis not present

## 2018-09-23 DIAGNOSIS — E1122 Type 2 diabetes mellitus with diabetic chronic kidney disease: Secondary | ICD-10-CM | POA: Diagnosis not present

## 2018-09-23 DIAGNOSIS — Z794 Long term (current) use of insulin: Secondary | ICD-10-CM | POA: Diagnosis not present

## 2018-09-23 DIAGNOSIS — E1140 Type 2 diabetes mellitus with diabetic neuropathy, unspecified: Secondary | ICD-10-CM | POA: Diagnosis not present

## 2018-09-24 DIAGNOSIS — M545 Low back pain: Secondary | ICD-10-CM | POA: Diagnosis not present

## 2018-09-27 ENCOUNTER — Encounter: Payer: Medicare Other | Admitting: *Deleted

## 2018-09-28 ENCOUNTER — Ambulatory Visit: Payer: Self-pay | Admitting: Pulmonary Disease

## 2018-09-28 DIAGNOSIS — M79672 Pain in left foot: Secondary | ICD-10-CM | POA: Diagnosis not present

## 2018-09-28 DIAGNOSIS — M722 Plantar fascial fibromatosis: Secondary | ICD-10-CM | POA: Diagnosis not present

## 2018-09-28 NOTE — Progress Notes (Signed)
Virtual Visit via Telephone Note  I connected with Amber Stephenson on 09/28/18 at 10:30 AM EDT by telephone and verified that I am speaking with the correct person using two identifiers.   I discussed the limitations, risks, security and privacy concerns of performing an evaluation and management service by telephone and the availability of in person appointments. I also discussed with the patient that there may be a patient responsible charge related to this service. The patient expressed understanding and agreed to proceed.   History of Present Illness: 73 year old female, never smoked. PMH significant for asthma, bipolar, type 2 diabetes, hyperlipidemia, insomnia, daytime somnolence, CHF, afib (on Eliquis). Patient of Dr. Lake Bells, last seen by pulmonary NP on 07/06/18. Maintained on Symbicort 80. Referred to pulmonary rehab and medical weight management. Evidence of pulmonary hypertension on echocardiogram, considered  no role for pulmonary vasodilators. Needs home sleep test still.   09/29/2018  Patient called today for 2-3 month follow-up visit. She is doing well, no acute complaints. Continues Symbicort as prescribed, needs to use her rescue inhaler once a week on average. Reports wheezing when she lies down at night. Dry cough, mucinex upset her stomach. Taking lasix 20mg  daily. Picking up HST next week, states that she is not willing to try CPAP again. She knows she needs to lose some weight and is open to possible oral device.    Observations/Objective:  Observations: - No shortness of breath, wheezing or cough observed during phone conversation   Testing: Chest imaging: May 2019 chest x-ray showed some basilar scarring versus atelectasis  PFT: August 2019 ratio normal, FVC 1.09 L 41% predicted, total lung capacity 3.59 L 78% predicted, DLCO 14.1 mL 69% predicted  Labs: August 2019 sed rate 24, aldolase normal 4.9, ANA negative, anti-Jo 1-, centromere negative, CCP negative,  rheumatoid factor negative, SSA negative, SSB negative, SCL 70neg  Echo: September 2016 echocardiogram LVEF 55 to 60%, left atrium severely dilated, trivial mitral regurgitation, PA systolic pressure 25 mmHg September 2019 echocardiogram normal LVEF but RVSP was 46 mmHg  Assessment and Plan:  Asthma - Stable; no recent exacerbation - Report dry cough and wheezing at night  - Continue Symbicort 80 2bid, prn albuterol  - Advised patient try daily antihistamine and/or flonase nasal spray during allergy season   Presumed sleep apnea: - Plan for HST this week  - Encouraged weight loss, side sleeping position   Elevated PA pressure  - Presumed pulmonary hypertension, no right heart cath - WHO group 2 - Needs HST to r/o sleep apnea  - Encourage weight loss - Pulmonary rehab on hold for DOE d/t covid restrictions   Follow Up Instructions:  2-4 months with Dr. Lake Bells or sooner if needed    I discussed the assessment and treatment plan with the patient. The patient was provided an opportunity to ask questions and all were answered. The patient agreed with the plan and demonstrated an understanding of the instructions.   The patient was advised to call back or seek an in-person evaluation if the symptoms worsen or if the condition fails to improve as anticipated.  I provided 20 minutes of non-face-to-face time during this encounter.   Martyn Ehrich, NP

## 2018-09-29 ENCOUNTER — Encounter: Payer: Self-pay | Admitting: Primary Care

## 2018-09-29 ENCOUNTER — Encounter: Payer: Self-pay | Admitting: Physical Therapy

## 2018-09-29 ENCOUNTER — Other Ambulatory Visit: Payer: Self-pay

## 2018-09-29 ENCOUNTER — Ambulatory Visit: Payer: Self-pay | Admitting: Pulmonary Disease

## 2018-09-29 ENCOUNTER — Ambulatory Visit: Payer: Medicare Other | Attending: Podiatry | Admitting: Physical Therapy

## 2018-09-29 ENCOUNTER — Ambulatory Visit (INDEPENDENT_AMBULATORY_CARE_PROVIDER_SITE_OTHER): Payer: Medicare Other | Admitting: Primary Care

## 2018-09-29 DIAGNOSIS — R262 Difficulty in walking, not elsewhere classified: Secondary | ICD-10-CM | POA: Diagnosis not present

## 2018-09-29 DIAGNOSIS — M79672 Pain in left foot: Secondary | ICD-10-CM | POA: Diagnosis not present

## 2018-09-29 DIAGNOSIS — G4739 Other sleep apnea: Secondary | ICD-10-CM | POA: Diagnosis not present

## 2018-09-29 DIAGNOSIS — J4520 Mild intermittent asthma, uncomplicated: Secondary | ICD-10-CM | POA: Diagnosis not present

## 2018-09-29 DIAGNOSIS — I272 Pulmonary hypertension, unspecified: Secondary | ICD-10-CM | POA: Diagnosis not present

## 2018-09-29 NOTE — Therapy (Signed)
[facility] [place], [place], [address] Phone: [phone]   Fax:  [phone]  Physical Therapy Evaluation  Patient Details  Name: Amber Stephenson MRN: [id number] Date of Birth: 24-Apr-1946 Referring Provider (PT): Steffanie Rainwater, MD   Encounter Date: 09/29/2018  PT End of Session - 09/29/18 0924    Visit Number  1    Number of Visits  8    Date for PT Re-Evaluation  11/10/18    Authorization Type  Progress note every 10th visit; KX modifier after 15th visit    PT Start Time  0815    PT Stop Time  0906    PT Time Calculation (min)  51 min    Activity Tolerance  Patient tolerated treatment well    Behavior During Therapy  [facility] for tasks assessed/performed       Past Medical History:  Diagnosis Date  . Atrial fibrillation ([place])   . Bipolar affective ([place])   . CHF (congestive heart failure) ([place])   . Chronic back pain   . Diabetes mellitus without complication ([place])   . Hyperlipidemia   . Hypertension   . Morbid obesity due to excess calories ([place]) 07/06/2018  . Pain management   . Renal insufficiency     Past Surgical History:  Procedure Laterality Date  . BREAST REDUCTION SURGERY    . EYE SURGERY Right    cateracts  . HAMMER TOE SURGERY    . LEFT HEART CATH AND CORONARY ANGIOGRAPHY N/A 02/03/2018   Procedure: LEFT HEART CATH AND CORONARY ANGIOGRAPHY;  Surgeon: Martinique, Peter M, MD;  Location: [facility];  Service: Cardiovascular;  Laterality: N/A;  . SHOULDER SURGERY Right   . THIGH SURGERY      There were no vitals filed for this visit.   Subjective Assessment - 09/29/18 0817    Subjective  COVID-19 screening performed prior to patient entering the building. Patient arrives to physical therapy with reports of left heel pain that began 3 years ago but exacerbated about 5 months ago. Patient reported having injections to which improved symptoms, but last injection only lasted one day. Patient stated she got  a boot yesterday and requires assistance with donning and doffing. Patient reports difficulties with ADLs due to pain while standing. Patient reports most difficulties with walking. Patient reports pain at worst is 8/10 and pain at best is 0/10 at rest. Patient's goals are to decrease pain, improve mobility and improve ability to perform ADLs and home activities.    Pertinent History  DM, chronic kidney disease stage 3, HTN, congestive heart failure, bipolar disorder    Limitations  Standing;Walking;House hold activities    How long can you sit comfortably?  unlimited    How long can you stand comfortably?  10-20 minutes    How long can you walk comfortably?  20 minutes    Diagnostic tests  x-ray: normal    Patient Stated Goals  get back to normal    Currently in Pain?  Yes    Pain Score  5     Pain Location  Foot    Pain Orientation  Left    Pain Descriptors / Indicators  Discomfort;Sore    Pain Type  Chronic pain    Pain Onset  More than a month ago    Pain Frequency  Intermittent    Aggravating Factors   walking and standing    Pain Relieving Factors  gel    Effect of Pain  on Daily Activities  difficulties with walking and standing for home activities         [facility] PT Assessment - 09/29/18 0001      Assessment   Medical Diagnosis  left plantar fasciitis    Referring Provider (PT)  Steffanie Rainwater, MD    Onset Date/Surgical Date  05/02/18   3 year history, exacerbation in December 2019   Next MD Visit  Oct 06, 2018    Prior Therapy  no      Precautions   Precautions  None      Restrictions   Weight Bearing Restrictions  No      Balance Screen   Has the patient fallen in the past 6 months  Yes    How many times?  1    Has the patient had a decrease in activity level because of a fear of falling?   Yes    Is the patient reluctant to leave their home because of a fear of falling?   No      Home Social worker  Private residence    Living Arrangements   Spouse/significant other;Children    Available Help at Discharge  Family    Type of [place] to enter    Entrance Stairs-Number of Steps  2    Entrance Stairs-Rails  None    Home Layout  Two level      Prior Function   Level of Independence  Independent with basic ADLs      ROM / Strength   AROM / PROM / Strength  AROM;Strength      AROM   AROM Assessment Site  Ankle    Right/Left Ankle  Left    Left Ankle Dorsiflexion  -10   -5 with knee flexed   Left Ankle Plantar Flexion  40    Left Ankle Inversion  40    Left Ankle Eversion  30      Strength   Strength Assessment Site  Ankle    Right/Left Ankle  Left    Left Ankle Dorsiflexion  4+/5    Left Ankle Plantar Flexion  4+/5    Left Ankle Inversion  4+/5    Left Ankle Eversion  4+/5      Palpation   Palpation comment  multiple trigger points throughout medial aspect of calf. Tender to palpation to left heel and arch of the foot.       Transfers   Comments  requires min A for transitions from supine to sit secondary to back pain      Ambulation/Gait   Gait Pattern  Step-through pattern;Step-to pattern;Decreased step length - right;Decreased stance time - left;Decreased stride length;Decreased dorsiflexion - left;Antalgic;Abducted - left;Wide base of support                Objective measurements completed on examination: See above findings.              PT Education - 09/29/18 0953    Education Details  gastroc and soleus stretch, seated heel and toe raises, great toe extension and stretch, frozen water bottle ice massage    Person(s) Educated  Patient;Spouse    Methods  Explanation;Demonstration;Handout    Comprehension  Verbalized understanding;Returned demonstration          PT Long Term Goals - 09/29/18 0928      PT LONG TERM GOAL #1   Title  Patient will be  independent with HEP.    Time  4    Period  Weeks    Status  New      PT LONG TERM GOAL #2   Title   Patient will demonstrate 5 degrees of left ankle DF AROM to improve gait mechanics.    Time  4    Period  Weeks    Status  New      PT LONG TERM GOAL #3   Title  Patient will demonstrate 5/5 left ankle MMT in all planes to improve stability during functional tasks.     Time  4    Period  Weeks    Status  New      PT LONG TERM GOAL #4   Title  Patient will report ability to perform ADLs with left foot/heel pain as less than 4/10.     Time  4    Period  Weeks    Status  New      PT LONG TERM GOAL #5   Title  Patient will report ability to walk for greater than 20 minutes with left ankle pain less than 4/10.    Time  4    Period  Weeks    Status  New             Plan - 09/29/18 1306    Clinical Impression Statement  Patient is a 73 year old female who presents physical therapy with left heel pain, decreased left MMT, and decreased left ROM. Patient ambulates with decreased left stance time, decreased right step length, and left foot abducted during stance. Patient provided with HEP to which patient reported understanding. Patient and patient's husband were educated on proper donning of boot and instructed to attempt ambulating in it. Patient reported understanding. Patient would benefit from skilled physical therapy to address deficits and address patient's goals.     Personal Factors and Comorbidities  Age;Comorbidity 3+    Comorbidities  DM, chronic kidney disease stage 3, HTN, congestive heart failure, bipolar disorder    Examination-Activity Limitations  Stairs;Stand;Locomotion Level    Stability/Clinical Decision Making  Stable/Uncomplicated    Clinical Decision Making  Low    Rehab Potential  Good    PT Frequency  2x / week    PT Duration  4 weeks    PT Treatment/Interventions  ADLs/Self Care Home Management;Electrical Stimulation;Iontophoresis 4mg /ml Dexamethasone;Moist Heat;Ultrasound;Cryotherapy;Gait training;Stair training;Functional mobility training;Neuromuscular  re-education;Balance training;Therapeutic exercise;Therapeutic activities;Patient/family education;Manual techniques;Dry needling;Passive range of motion;Taping;Vasopneumatic Device    PT Next Visit Plan  nustep, ankle ROM, stretching of left ankle, PROM, STW/M to left calf, modalities PRN for pain relif.     PT Home Exercise Plan  see patient education    Consulted and Agree with Plan of Care  Patient       Patient will benefit from skilled therapeutic intervention in order to improve the following deficits and impairments:  Pain, Decreased activity tolerance, Decreased endurance, Decreased range of motion, Decreased strength, Difficulty walking  Visit Diagnosis: Pain in left foot - Plan: PT plan of care cert/re-cert  Difficulty in walking, not elsewhere classified - Plan: PT plan of care cert/re-cert     Problem List Patient Active Problem List   Diagnosis Date Noted  . Obesity (BMI 30-39.9) 07/06/2018  . At risk for sleep apnea 07/06/2018  . Closed fracture of proximal end of left humerus 03/16/2018  . Left humeral fracture 03/06/2018  . Fracture of four ribs of left side, closed, initial encounter 03/06/2018  .  Mild intermittent asthma without complication 59/29/2446  . Cardiomegaly 01/12/2018  . NAFL (nonalcoholic fatty liver) 28/63/8177  . Age-related osteoporosis without current pathological fracture 12/15/2017  . Asthma 11/12/2017  . Atrial fibrillation with RVR ([place]) 05/19/2017  . Microscopic colitis 05/19/2017  . Hypotension 05/19/2017  . Fibromyalgia 03/19/2017  . Healthcare maintenance 02/12/2016  . Migraine headache with aura 02/12/2016  . Diabetic neuropathy ([place]) 02/06/2016  . Depression   . Herpes genitalis in women 07/16/2015  . Chronic anticoagulation 05/30/2015  . Family history of coronary artery disease in sister 05/30/2015  . Chronic diastolic CHF (congestive heart failure) ([place]) 05/30/2015  . Chest pain with moderate risk of acute coronary syndrome  05/29/2015  . Dyspnea on exertion   . Essential hypertension   . RLS (restless legs syndrome) 04/27/2015  . Lipoma 02/08/2015  . Bipolar disorder ([place]) 01/23/2015  . Insomnia 01/23/2015  . Chronic back pain 01/04/2015  . Permanent atrial fibrillation 01/04/2015  . DM2 (diabetes mellitus, type 2) ([place])   . Hyperlipidemia    Gabriela Eves, PT, DPT 09/29/2018, 2:07 PM  [facility] [address] [place], [place], [address] Phone: [phone]   Fax:  [phone]  Name: Amber Stephenson MRN: [id number] Date of Birth: 1946-03-31

## 2018-09-29 NOTE — Patient Instructions (Signed)
Asthma - Continue Symbicort 2 puffs twice daily - Use albuterol rescue inhaler/neb every 4-6 hours as needed for shortness of breath/wheezing - Recommend trial antihistamine and/or flonase nasal spray during allergy season  - Elevate head of bed when sleeping if possible to help with wheezing   Presumed sleep apnea: - Plan for home sleep test this week    Exercising to Lose Weight Exercise is structured, repetitive physical activity to improve fitness and health. Getting regular exercise is important for everyone. It is especially important if you are overweight. Being overweight increases your risk of heart disease, stroke, diabetes, high blood pressure, and several types of cancer. Reducing your calorie intake and exercising can help you lose weight. Exercise is usually categorized as moderate or vigorous intensity. To lose weight, most people need to do a certain amount of moderate-intensity or vigorous-intensity exercise each week. Moderate-intensity exercise  Moderate-intensity exercise is any activity that gets you moving enough to burn at least three times more energy (calories) than if you were sitting. Examples of moderate exercise include:  Walking a mile in 15 minutes.  Doing light yard work.  Biking at an easy pace. Most people should get at least 150 minutes (2 hours and 30 minutes) a week of moderate-intensity exercise to maintain their body weight. Vigorous-intensity exercise Vigorous-intensity exercise is any activity that gets you moving enough to burn at least six times more calories than if you were sitting. When you exercise at this intensity, you should be working hard enough that you are not able to carry on a conversation. Examples of vigorous exercise include:  Running.  Playing a team sport, such as football, basketball, and soccer.  Jumping rope. Most people should get at least 75 minutes (1 hour and 15 minutes) a week of vigorous-intensity exercise to  maintain their body weight. How can exercise affect me? When you exercise enough to burn more calories than you eat, you lose weight. Exercise also reduces body fat and builds muscle. The more muscle you have, the more calories you burn. Exercise also:  Improves mood.  Reduces stress and tension.  Improves your overall fitness, flexibility, and endurance.  Increases bone strength. The amount of exercise you need to lose weight depends on:  Your age.  The type of exercise.  Any health conditions you have.  Your overall physical ability. Talk to your health care provider about how much exercise you need and what types of activities are safe for you. What actions can I take to lose weight? Nutrition   Make changes to your diet as told by your health care provider or diet and nutrition specialist (dietitian). This may include: ? Eating fewer calories. ? Eating more protein. ? Eating less unhealthy fats. ? Eating a diet that includes fresh fruits and vegetables, whole grains, low-fat dairy products, and lean protein. ? Avoiding foods with added fat, salt, and sugar.  Drink plenty of water while you exercise to prevent dehydration or heat stroke. Activity  Choose an activity that you enjoy and set realistic goals. Your health care provider can help you make an exercise plan that works for you.  Exercise at a moderate or vigorous intensity most days of the week. ? The intensity of exercise may vary from person to person. You can tell how intense a workout is for you by paying attention to your breathing and heartbeat. Most people will notice their breathing and heartbeat get faster with more intense exercise.  Do resistance training twice each week,  such as: ? Push-ups. ? Sit-ups. ? Lifting weights. ? Using resistance bands.  Getting short amounts of exercise can be just as helpful as long structured periods of exercise. If you have trouble finding time to exercise, try to  include exercise in your daily routine. ? Get up, stretch, and walk around every 30 minutes throughout the day. ? Go for a walk during your lunch break. ? Park your car farther away from your destination. ? If you take public transportation, get off one stop early and walk the rest of the way. ? Make phone calls while standing up and walking around. ? Take the stairs instead of elevators or escalators.  Wear comfortable clothes and shoes with good support.  Do not exercise so much that you hurt yourself, feel dizzy, or get very short of breath. Where to find more information  U.S. Department of Health and Human Services: [employer]at  Centers for Disease Control and Prevention (CDC): http://www.wolf.info/ Contact a health care provider:  Before starting a new exercise program.  If you have questions or concerns about your weight.  If you have a medical problem that keeps you from exercising. Get help right away if you have any of the following while exercising:  Injury.  Dizziness.  Difficulty breathing or shortness of breath that does not go away when you stop exercising.  Chest pain.  Rapid heartbeat. Summary  Being overweight increases your risk of heart disease, stroke, diabetes, high blood pressure, and several types of cancer.  Losing weight happens when you burn more calories than you eat.  Reducing the amount of calories you eat in addition to getting regular moderate or vigorous exercise each week helps you lose weight. This information is not intended to replace advice given to you by your health care provider. Make sure you discuss any questions you have with your health care provider. Document Released: 06/21/2010 Document Revised: 06/01/2017 Document Reviewed: 06/01/2017 Elsevier Interactive Patient Education  2019 [place].  Asthma, Adult  Asthma is a long-term (chronic) condition in which the airways get tight and narrow. The airways are the breathing passages  that lead from the nose and mouth down into the lungs. A person with asthma will have times when symptoms get worse. These are called asthma attacks. They can cause coughing, whistling sounds when you breathe (wheezing), shortness of breath, and chest pain. They can make it hard to breathe. There is no cure for asthma, but medicines and lifestyle changes can help control it. There are many things that can bring on an asthma attack or make asthma symptoms worse (triggers). Common triggers include:  Mold.  Dust.  Cigarette smoke.  Cockroaches.  Things that can cause allergy symptoms (allergens). These include animal skin flakes (dander) and pollen from trees or grass.  Things that pollute the air. These may include household cleaners, wood smoke, smog, or chemical odors.  Cold air, weather changes, and wind.  Crying or laughing hard.  Stress.  Certain medicines or drugs.  Certain foods such as dried fruit, potato chips, and grape juice.  Infections, such as a cold or the flu.  Certain medical conditions or diseases.  Exercise or tiring activities. Asthma may be treated with medicines and by staying away from the things that cause asthma attacks. Types of medicines may include:  Controller medicines. These help prevent asthma symptoms. They are usually taken every day.  Fast-acting reliever or rescue medicines. These quickly relieve asthma symptoms. They are used as needed and provide  short-term relief.  Allergy medicines if your attacks are brought on by allergens.  Medicines to help control the body's defense (immune) system. Follow these instructions at home: Avoiding triggers in your home  Change your heating and air conditioning filter often.  Limit your use of fireplaces and wood stoves.  Get rid of pests (such as roaches and mice) and their droppings.  Throw away plants if you see mold on them.  Clean your floors. Dust regularly. Use cleaning products that do not  smell.  Have someone vacuum when you are not home. Use a vacuum cleaner with a HEPA filter if possible.  Replace carpet with wood, tile, or vinyl flooring. Carpet can trap animal skin flakes and dust.  Use allergy-proof pillows, mattress covers, and box spring covers.  Wash bed sheets and blankets every week in hot water. Dry them in a dryer.  Keep your bedroom free of any triggers.  Avoid pets and keep windows closed when things that cause allergy symptoms are in the air.  Use blankets that are made of polyester or cotton.  Clean bathrooms and kitchens with bleach. If possible, have someone repaint the walls in these rooms with mold-resistant paint. Keep out of the rooms that are being cleaned and painted.  Wash your hands often with soap and water. If soap and water are not available, use hand sanitizer.  Do not allow anyone to smoke in your home. General instructions  Take over-the-counter and prescription medicines only as told by your doctor. ? Talk with your doctor if you have questions about how or when to take your medicines. ? Make note if you need to use your medicines more often than usual.  Do not use any products that contain nicotine or tobacco, such as cigarettes and e-cigarettes. If you need help quitting, ask your doctor.  Stay away from secondhand smoke.  Avoid doing things outdoors when allergen counts are high and when air quality is low.  Wear a ski mask when doing outdoor activities in the winter. The mask should cover your nose and mouth. Exercise indoors on cold days if you can.  Warm up before you exercise. Take time to cool down after exercise.  Use a peak flow meter as told by your doctor. A peak flow meter is a tool that measures how well the lungs are working.  Keep track of the peak flow meter's readings. Write them down.  Follow your asthma action plan. This is a written plan for taking care of your asthma and treating your attacks.  Make  sure you get all the shots (vaccines) that your doctor recommends. Ask your doctor about a flu shot and a pneumonia shot.  Keep all follow-up visits as told by your doctor. This is important. Contact a doctor if:  You have wheezing, shortness of breath, or a cough even while taking medicine to prevent attacks.  The mucus you cough up (sputum) is thicker than usual.  The mucus you cough up changes from clear or white to yellow, green, gray, or bloody.  You have problems from the medicine you are taking, such as: ? A rash. ? Itching. ? Swelling. ? Trouble breathing.  You need reliever medicines more than 2-3 times a week.  Your peak flow reading is still at 50-79% of your personal best after following the action plan for 1 hour.  You have a fever. Get help right away if:  You seem to be worse and are not responding to medicine during  an asthma attack.  You are short of breath even at rest.  You get short of breath when doing very little activity.  You have trouble eating, drinking, or talking.  You have chest pain or tightness.  You have a fast heartbeat.  Your lips or fingernails start to turn blue.  You are light-headed or dizzy, or you faint.  Your peak flow is less than 50% of your personal best.  You feel too tired to breathe normally. Summary  Asthma is a long-term (chronic) condition in which the airways get tight and narrow. An asthma attack can make it hard to breathe.  Asthma cannot be cured, but medicines and lifestyle changes can help control it.  Make sure you understand how to avoid triggers and how and when to use your medicines. This information is not intended to replace advice given to you by your health care provider. Make sure you discuss any questions you have with your health care provider. Document Released: 11/05/2007 Document Revised: 06/23/2016 Document Reviewed: 06/23/2016 Elsevier Interactive Patient Education  2019 [employer].

## 2018-10-04 DIAGNOSIS — Z9189 Other specified personal risk factors, not elsewhere classified: Secondary | ICD-10-CM | POA: Diagnosis not present

## 2018-10-04 DIAGNOSIS — M5136 Other intervertebral disc degeneration, lumbar region: Secondary | ICD-10-CM | POA: Diagnosis not present

## 2018-10-04 DIAGNOSIS — M545 Low back pain: Secondary | ICD-10-CM | POA: Diagnosis not present

## 2018-10-04 DIAGNOSIS — G8929 Other chronic pain: Secondary | ICD-10-CM | POA: Diagnosis not present

## 2018-10-05 ENCOUNTER — Ambulatory Visit: Payer: Medicare Other | Attending: Podiatry | Admitting: Physical Therapy

## 2018-10-05 ENCOUNTER — Other Ambulatory Visit: Payer: Self-pay

## 2018-10-05 ENCOUNTER — Encounter: Payer: Self-pay | Admitting: Physical Therapy

## 2018-10-05 DIAGNOSIS — M79672 Pain in left foot: Secondary | ICD-10-CM | POA: Diagnosis not present

## 2018-10-05 DIAGNOSIS — R2689 Other abnormalities of gait and mobility: Secondary | ICD-10-CM | POA: Insufficient documentation

## 2018-10-05 DIAGNOSIS — R262 Difficulty in walking, not elsewhere classified: Secondary | ICD-10-CM | POA: Diagnosis not present

## 2018-10-05 NOTE — Therapy (Signed)
[facility] [place], [place], [address] Phone: [phone]   Fax:  [phone]  Physical Therapy Treatment  Patient Details  Name: Amber Stephenson MRN: [id number] Date of Birth: 22-May-1946 Referring Provider (PT): Steffanie Rainwater, MD   Encounter Date: 10/05/2018  PT End of Session - 10/05/18 1432    Visit Number  2    Number of Visits  8    Date for PT Re-Evaluation  11/10/18    Authorization Type  Progress note every 10th visit; KX modifier after 15th visit    PT Start Time  1254    PT Stop Time  1352    PT Time Calculation (min)  58 min    Activity Tolerance  Patient tolerated treatment well    Behavior During Therapy  [facility] for tasks assessed/performed       Past Medical History:  Diagnosis Date  . Atrial fibrillation ([place])   . Bipolar affective ([place])   . CHF (congestive heart failure) ([place])   . Chronic back pain   . Diabetes mellitus without complication ([place])   . Hyperlipidemia   . Hypertension   . Morbid obesity due to excess calories ([place]) 07/06/2018  . Pain management   . Renal insufficiency     Past Surgical History:  Procedure Laterality Date  . BREAST REDUCTION SURGERY    . EYE SURGERY Right    cateracts  . HAMMER TOE SURGERY    . LEFT HEART CATH AND CORONARY ANGIOGRAPHY N/A 02/03/2018   Procedure: LEFT HEART CATH AND CORONARY ANGIOGRAPHY;  Surgeon: Martinique, Peter M, MD;  Location: [facility];  Service: Cardiovascular;  Laterality: N/A;  . SHOULDER SURGERY Right   . THIGH SURGERY      There were no vitals filed for this visit.  Subjective Assessment - 10/05/18 1418    Subjective  Patient presented in clinic with 4/10 L foot pain and boot donned. Patient able to complete therex well with no complaints of pain. Patient presented with palpable and pronounced TPs in L gastroc and posterior tibialis. Patient very sensitive to gentle TPR/pressure and educated that she may be very sore in the following  days following manual therapy. Patient also educated to use heat at home for 15-20 minutes at a time to reduce calf tightness and to complete HEP 2-3x per day. Normal modalities response noted following removal of the modalities.     Pertinent History  DM, chronic kidney disease stage 3, HTN, congestive heart failure, bipolar disorder    Limitations  Standing;Walking;House hold activities    How long can you sit comfortably?  unlimited    How long can you stand comfortably?  10-20 minutes    How long can you walk comfortably?  20 minutes    Diagnostic tests  x-ray: normal    Patient Stated Goals  get back to normal    Currently in Pain?  Yes    Pain Score  4     Pain Location  Foot    Pain Orientation  Left    Pain Descriptors / Indicators  Discomfort    Pain Type  Chronic pain    Pain Onset  More than a month ago    Pain Frequency  Constant         [facility] PT Assessment - 10/05/18 0001      Assessment   Medical Diagnosis  left plantar fasciitis    Referring Provider (PT)  Steffanie Rainwater, MD  Onset Date/Surgical Date  05/02/18    Next MD Visit  TBD    Prior Therapy  no      Precautions   Precautions  None      Restrictions   Weight Bearing Restrictions  No                   [facility] Adult PT Treatment/Exercise - 10/05/18 0001      Exercises   Exercises  Ankle      Modalities   Modalities  Electrical Stimulation;Moist Heat      Moist Heat Therapy   Number Minutes Moist Heat  15 Minutes    Moist Heat Location  Other (comment)   L calf     Electrical Stimulation   Electrical Stimulation Location  L gastroc, arch    Electrical Stimulation Action  IFC    Electrical Stimulation Parameters  80-150 hz x15 min    Electrical Stimulation Goals  Pain;Tone      Manual Therapy   Manual Therapy  Myofascial release    Myofascial Release  TPR to L calf, posterior tibialis to reduce pronounced TPs and muscle tightness      Ankle Exercises: Seated   Heel Raises  Both;20  reps    Toe Raise  20 reps    Other Seated Ankle Exercises  L rockerboard DF/PF x5 min    Other Seated Ankle Exercises  L dynadisc DF/PF, circles x5 min                  PT Long Term Goals - 09/29/18 9628      PT LONG TERM GOAL #1   Title  Patient will be independent with HEP.    Time  4    Period  Weeks    Status  New      PT LONG TERM GOAL #2   Title  Patient will demonstrate 5 degrees of left ankle DF AROM to improve gait mechanics.    Time  4    Period  Weeks    Status  New      PT LONG TERM GOAL #3   Title  Patient will demonstrate 5/5 left ankle MMT in all planes to improve stability during functional tasks.     Time  4    Period  Weeks    Status  New      PT LONG TERM GOAL #4   Title  Patient will report ability to perform ADLs with left foot/heel pain as less than 4/10.     Time  4    Period  Weeks    Status  New      PT LONG TERM GOAL #5   Title  Patient will report ability to walk for greater than 20 minutes with left ankle pain less than 4/10.    Time  4    Period  Weeks    Status  New            Plan - 10/05/18 1427    Clinical Impression Statement  Patient presented in clinic with 4/10 L foot pain and boot donned. Patient able to complete therex well with no complaints of pain. Patient presented with palpable and pronounced TPs in L gastroc and posterior tibialis. Patient very sensitive to gentle TPR/pressure and educated that she may be very sore in the following days following manual therapy. Patient also educated to use heat at home for 15-20 minutes at a time to reduce calf  tightness and to complete HEP 2-3x per day. Normal modalities response noted following removal of the modalities.     Personal Factors and Comorbidities  Age;Comorbidity 3+    Comorbidities  DM, chronic kidney disease stage 3, HTN, congestive heart failure, bipolar disorder    Examination-Activity Limitations  Stairs;Stand;Locomotion Level    Stability/Clinical  Decision Making  Stable/Uncomplicated    Rehab Potential  Good    PT Frequency  2x / week    PT Duration  4 weeks    PT Treatment/Interventions  ADLs/Self Care Home Management;Electrical Stimulation;Iontophoresis 4mg /ml Dexamethasone;Moist Heat;Ultrasound;Cryotherapy;Gait training;Stair training;Functional mobility training;Neuromuscular re-education;Balance training;Therapeutic exercise;Therapeutic activities;Patient/family education;Manual techniques;Dry needling;Passive range of motion;Taping;Vasopneumatic Device    PT Next Visit Plan  Continue TPR/STW to L calf as symptoms dictate. USE SMALL BOLSTER and requires assist with don/doff boot.    PT Home Exercise Plan  see patient education    Consulted and Agree with Plan of Care  Patient       Patient will benefit from skilled therapeutic intervention in order to improve the following deficits and impairments:  Pain, Decreased activity tolerance, Decreased endurance, Decreased range of motion, Decreased strength, Difficulty walking  Visit Diagnosis: Pain in left foot  Difficulty in walking, not elsewhere classified     Problem List Patient Active Problem List   Diagnosis Date Noted  . Obesity (BMI 30-39.9) 07/06/2018  . At risk for sleep apnea 07/06/2018  . Closed fracture of proximal end of left humerus 03/16/2018  . Left humeral fracture 03/06/2018  . Fracture of four ribs of left side, closed, initial encounter 03/06/2018  . Mild intermittent asthma without complication 83/66/2947  . Cardiomegaly 01/12/2018  . NAFL (nonalcoholic fatty liver) 65/46/5035  . Age-related osteoporosis without current pathological fracture 12/15/2017  . Asthma 11/12/2017  . Atrial fibrillation with RVR ([place]) 05/19/2017  . Microscopic colitis 05/19/2017  . Hypotension 05/19/2017  . Fibromyalgia 03/19/2017  . Healthcare maintenance 02/12/2016  . Migraine headache with aura 02/12/2016  . Diabetic neuropathy ([place]) 02/06/2016  . Depression   .  Herpes genitalis in women 07/16/2015  . Chronic anticoagulation 05/30/2015  . Family history of coronary artery disease in sister 05/30/2015  . Chronic diastolic CHF (congestive heart failure) ([place]) 05/30/2015  . Chest pain with moderate risk of acute coronary syndrome 05/29/2015  . Dyspnea on exertion   . Essential hypertension   . RLS (restless legs syndrome) 04/27/2015  . Lipoma 02/08/2015  . Bipolar disorder ([place]) 01/23/2015  . Insomnia 01/23/2015  . Chronic back pain 01/04/2015  . Permanent atrial fibrillation 01/04/2015  . DM2 (diabetes mellitus, type 2) ([place])   . Hyperlipidemia     Standley Brooking, PTA 10/05/2018, 2:34 PM  [facility] [address] [place], [place], [address] Phone: [phone]   Fax:  [phone]  Name: Amber Stephenson MRN: [id number] Date of Birth: 06/23/1945

## 2018-10-07 ENCOUNTER — Encounter: Payer: Self-pay | Admitting: Family Medicine

## 2018-10-07 ENCOUNTER — Other Ambulatory Visit: Payer: Self-pay | Admitting: *Deleted

## 2018-10-07 ENCOUNTER — Other Ambulatory Visit: Payer: Self-pay

## 2018-10-07 ENCOUNTER — Ambulatory Visit (INDEPENDENT_AMBULATORY_CARE_PROVIDER_SITE_OTHER): Payer: Medicare Other | Admitting: Family Medicine

## 2018-10-07 DIAGNOSIS — I5032 Chronic diastolic (congestive) heart failure: Secondary | ICD-10-CM

## 2018-10-07 DIAGNOSIS — B372 Candidiasis of skin and nail: Secondary | ICD-10-CM | POA: Diagnosis not present

## 2018-10-07 DIAGNOSIS — E782 Mixed hyperlipidemia: Secondary | ICD-10-CM

## 2018-10-07 DIAGNOSIS — I4821 Permanent atrial fibrillation: Secondary | ICD-10-CM

## 2018-10-07 MED ORDER — CEPHALEXIN 500 MG PO CAPS: 500.0000 mg | ORAL_CAPSULE | Freq: Three times a day (TID) | ORAL | 0 refills | Status: AC

## 2018-10-07 MED ORDER — NYSTATIN 100000 UNIT/GM EX POWD: Freq: Three times a day (TID) | CUTANEOUS | 1 refills | Status: DC

## 2018-10-07 MED ORDER — ICOSAPENT ETHYL 1 G PO CAPS: 2.0000 | ORAL_CAPSULE | Freq: Two times a day (BID) | ORAL | 0 refills | Status: DC

## 2018-10-07 NOTE — Progress Notes (Signed)
Telephone visit  Subjective: CC: yeast PCP: [name], DO Amber Stephenson is a 73 y.o. female calls for telephone consult today. Patient provides verbal consent for consult held via phone.  Location of patient: home Location of provider: [facility] Others present for call: husband  1. Yeast infection Patient reports 1 month history of a lesion under the left breast that appears to be like her typical yeast rash but now has a "gooey" discharge.  She denies any fevers.  She reports quite a bit of itching and burning underneath the breast and along the groin region.  She is prescribed ketoconazole to apply to the affected areas but states that she has not been compliant with is only using it about half the time.  She "tries to keep things dry but has difficulty", particularly in the groin area.   ROS: Per HPI  Allergies  Allergen Reactions  . Ivp Dye [Iodinated Diagnostic Agents] Anaphylaxis and Swelling    Throat closes  . Latex     Pulls skin with it   Past Medical History:  Diagnosis Date  . Atrial fibrillation ([place])   . Bipolar affective ([place])   . CHF (congestive heart failure) ([place])   . Chronic back pain   . Diabetes mellitus without complication ([place])   . Hyperlipidemia   . Hypertension   . Morbid obesity due to excess calories ([place]) 07/06/2018  . Pain management   . Renal insufficiency     Current Outpatient Medications:  .  albuterol (PROVENTIL) (2.5 MG/3ML) 0.083% nebulizer solution, Take 3 mLs (2.5 mg total) by nebulization every 6 (six) hours as needed for wheezing or shortness of breath., Disp: 75 mL, Rfl: 1 .  ARIPiprazole (ABILIFY) 10 MG tablet, Take 3 tablets (30 mg total) by mouth daily., Disp: 90 tablet, Rfl: 4 .  atorvastatin (LIPITOR) 40 MG tablet, TAKE 1 TABLET DAILY, Disp: 90 tablet, Rfl: 0 .  Blood Glucose Monitoring Suppl (FREESTYLE LITE) DEVI, Use to check blood sugar tid. DX E11.22, Disp: 1 each, Rfl: 0 .  budesonide (ENTOCORT EC) 3 MG 24 hr  capsule, Take 9 mg by mouth every morning., Disp: , Rfl:  .  budesonide-formoterol (SYMBICORT) 80-4.5 MCG/ACT inhaler, Inhale 2 puffs into the lungs 2 (two) times daily., Disp: 2 Inhaler, Rfl: 0 .  carbamazepine (CARBATROL) 100 MG 12 hr capsule, Take 1 capsule (100 mg total) by mouth daily., Disp: 30 capsule, Rfl: 5 .  Cholecalciferol (VITAMIN D3) 1000 UNITS CAPS, Take 1,000 Units by mouth daily., Disp: , Rfl:  .  diltiazem (CARDIZEM CD) 120 MG 24 hr capsule, Take 1 capsule (120 mg total) by mouth at bedtime., Disp: 90 capsule, Rfl: 3 .  Dulaglutide (TRULICITY) 1.5 NT/7.0YF SOPN, Inject 1.5 mg into the skin once a week., Disp: 12 pen, Rfl: 3 .  ELIQUIS 5 MG TABS tablet, TAKE 1 TABLET TWICE A DAY (NEED TO BE SEEN BEFORE NEXT REFILL), Disp: 180 tablet, Rfl: 1 .  Eszopiclone (ESZOPICLONE) 3 MG TABS, Take 1 tablet (3 mg total) by mouth at bedtime as needed. Take immediately before bedtime, Disp: 30 tablet, Rfl: 4 .  furosemide (LASIX) 20 MG tablet, Take 1 tablet (20 mg total) by mouth 2 (two) times daily., Disp: 180 tablet, Rfl: 1 .  glucose blood (FREESTYLE LITE) test strip, USE FOR TESTING SUGAR THREE TIMES DAILY, Disp: 300 each, Rfl: 3 .  Icosapent Ethyl (VASCEPA) 1 g CAPS, Take 2 capsules (2 g total) by mouth 2 (two) times daily with a  meal., Disp: 120 capsule, Rfl: 12 .  Insulin Glargine (LANTUS SOLOSTAR) 100 UNIT/ML Solostar Pen, INJECT 60 UNITS UNDER THE SKIN TWICE A DAY (Patient taking differently: Inject 60 Units into the skin 2 (two) times daily. ), Disp: 90 mL, Rfl: 0 .  Insulin Pen Needle (SURE COMFORT PEN NEEDLES) 32G X 4 MM MISC, Use with insulin pens as directed. Dx E11.9, Disp: 300 each, Rfl: 11 .  ketoconazole (NIZORAL) 2 % cream, Apply to affected areas in groin region ONE time daily for 3-4 weeks., Disp: 60 g, Rfl: 1 .  Lancets (FREESTYLE) lancets, Test tid. DX E11.22, Disp: 100 each, Rfl: 5 .  LORazepam (ATIVAN) 1 MG tablet, Take 1 tab (1 mg) in the morning and 2 tabs (2 mg ) at  bedtime, Disp: 90 tablet, Rfl: 4 .  metoprolol tartrate (LOPRESSOR) 100 MG tablet, Take 1 tablet (100 mg total) by mouth 2 (two) times daily. Take with metoprolol 25 mg to equal 125 mg, Disp: 28 tablet, Rfl: 0 .  metoprolol tartrate (LOPRESSOR) 25 MG tablet, Take 1 tablet (25 mg total) by mouth 2 (two) times daily. Take with metoprolol 100 mg to equal 125 mg, Disp: 28 tablet, Rfl: 0 .  naloxone (NARCAN) nasal spray 4 mg/0.1 mL, Place 1 spray into the nose once as needed (opiod overdose)., Disp: , Rfl:  .  pregabalin (LYRICA) 150 MG capsule, Take 1 capsule (150 mg total) by mouth 2 (two) times daily., Disp: 180 capsule, Rfl: 4 .  rizatriptan (MAXALT-MLT) 10 MG disintegrating tablet, DISSOLVE UNDER TONGUE AT FIRST SIGN OF HEADACHE MAY REPEAT IN 1 HR AS NEEDED, Disp: 10 tablet, Rfl: 2 .  topiramate (TOPAMAX) 25 MG tablet, Take 1 tablet every night for 1 week, then increase to 2 tablets every night for 1 week, then increase to 3 tablets every night and continue, Disp: 90 tablet, Rfl: 11 .  traZODone (DESYREL) 100 MG tablet, 4  qhs, Disp: 120 tablet, Rfl: 5  Assessment/ Plan: 73 y.o. female   1. Candidal intertrigo Patient with known, chronic issues with candidal intertrigo.  Ongoing issues are likely secondary to noncompliance with topical therapy.  However, she is also demonstrating exudative lesions and therefore I will empirically cover with oral antibiotics.  Keflex 3 times daily for 7 days prescribed.  Change ketoconazole to nystatin powder applied 3 times daily.  We have scheduled a follow-up in office in 2 weeks for reevaluation. - nystatin (MYCOSTATIN/NYSTOP) powder; Apply topically 3 (three) times daily. x2 weeks (for groin/breast rash)  Dispense: 60 g; Refill: 1 - cephALEXin (KEFLEX) 500 MG capsule; Take 1 capsule (500 mg total) by mouth 3 (three) times daily for 7 days.  Dispense: 21 capsule; Refill: 0   Start time: 10:30am End time: 10:37am  Total time spent on patient care (including  telephone call/ virtual visit): 15 minutes  [place], [place] [phone]

## 2018-10-08 ENCOUNTER — Ambulatory Visit: Payer: Medicare Other

## 2018-10-08 ENCOUNTER — Other Ambulatory Visit: Payer: Self-pay

## 2018-10-08 DIAGNOSIS — Z9189 Other specified personal risk factors, not elsewhere classified: Secondary | ICD-10-CM

## 2018-10-09 DIAGNOSIS — G4733 Obstructive sleep apnea (adult) (pediatric): Secondary | ICD-10-CM | POA: Diagnosis not present

## 2018-10-12 ENCOUNTER — Ambulatory Visit: Payer: Medicare Other | Admitting: Physical Therapy

## 2018-10-12 ENCOUNTER — Other Ambulatory Visit: Payer: Self-pay

## 2018-10-12 ENCOUNTER — Encounter: Payer: Self-pay | Admitting: Physical Therapy

## 2018-10-12 DIAGNOSIS — R262 Difficulty in walking, not elsewhere classified: Secondary | ICD-10-CM | POA: Diagnosis not present

## 2018-10-12 DIAGNOSIS — R2689 Other abnormalities of gait and mobility: Secondary | ICD-10-CM | POA: Diagnosis not present

## 2018-10-12 DIAGNOSIS — M79672 Pain in left foot: Secondary | ICD-10-CM

## 2018-10-12 NOTE — Therapy (Signed)
[facility] [place], [place], [address] Phone: [phone]   Fax:  [phone]  Physical Therapy Treatment  Patient Details  Name: Amber Stephenson MRN: [id number] Date of Birth: 1945-10-26 Referring Provider (PT): Steffanie Rainwater, MD   Encounter Date: 10/12/2018  PT End of Session - 10/12/18 1410    Visit Number  3    Number of Visits  8    Date for PT Re-Evaluation  11/10/18    Authorization Type  Progress note every 10th visit; KX modifier after 15th visit    PT Start Time  1305    PT Stop Time  1357    PT Time Calculation (min)  52 min    Activity Tolerance  Patient limited by pain    Behavior During Therapy  [facility] for tasks assessed/performed       Past Medical History:  Diagnosis Date  . Atrial fibrillation ([place])   . Bipolar affective ([place])   . CHF (congestive heart failure) ([place])   . Chronic back pain   . Diabetes mellitus without complication ([place])   . Hyperlipidemia   . Hypertension   . Morbid obesity due to excess calories ([place]) 07/06/2018  . Pain management   . Renal insufficiency     Past Surgical History:  Procedure Laterality Date  . BREAST REDUCTION SURGERY    . EYE SURGERY Right    cateracts  . HAMMER TOE SURGERY    . LEFT HEART CATH AND CORONARY ANGIOGRAPHY N/A 02/03/2018   Procedure: LEFT HEART CATH AND CORONARY ANGIOGRAPHY;  Surgeon: Martinique, Peter M, MD;  Location: [facility];  Service: Cardiovascular;  Laterality: N/A;  . SHOULDER SURGERY Right   . THIGH SURGERY      There were no vitals filed for this visit.  Subjective Assessment - 10/12/18 1342    Subjective  COVID 19 screening performed on patient prior to entering building. Patient reports that her pain is about the same.    Pertinent History  DM, chronic kidney disease stage 3, HTN, congestive heart failure, bipolar disorder    Limitations  Standing;Walking;House hold activities    How long can you sit comfortably?  unlimited    How long can you stand comfortably?  10-20 minutes    How long can you walk comfortably?  20 minutes    Diagnostic tests  x-ray: normal    Patient Stated Goals  get back to normal    Currently in Pain?  Yes    Pain Score  7     Pain Location  Foot    Pain Orientation  Left    Pain Descriptors / Indicators  Discomfort    Pain Type  Chronic pain    Pain Onset  More than a month ago    Pain Frequency  Constant         [facility] PT Assessment - 10/12/18 0001      Assessment   Medical Diagnosis  left plantar fasciitis    Referring Provider (PT)  Steffanie Rainwater, MD    Onset Date/Surgical Date  05/02/18    Next MD Visit  TBD    Prior Therapy  no      Precautions   Precautions  None      Restrictions   Weight Bearing Restrictions  No                   [facility] Adult PT Treatment/Exercise - 10/12/18 0001  Modalities   Modalities  Electrical Stimulation;Moist Heat      Moist Heat Therapy   Number Minutes Moist Heat  15 Minutes    Moist Heat Location  Other (comment)   calf, foot     Electrical Stimulation   Electrical Stimulation Location  L gastroc, arch    Electrical Stimulation Action  IFC    Electrical Stimulation Parameters  80-150 hz x15 min    Electrical Stimulation Goals  Pain;Tone      Manual Therapy   Manual Therapy  Myofascial release;Passive ROM    Myofascial Release  TPR/STW to L calf, posterior tibialis, arch to reduce pronounced TPs and muscle tightness    Passive ROM  Passive L gastroc (5x30 sec), soleus and plantar fascia stretch (3x30 sec) to improve flexibility and reduce restrictions                  PT Long Term Goals - 10/12/18 1412      PT LONG TERM GOAL #1   Title  Patient will be independent with HEP.    Time  4    Period  Weeks    Status  On-going      PT LONG TERM GOAL #2   Title  Patient will demonstrate 5 degrees of left ankle DF AROM to improve gait mechanics.    Time  4    Period  Weeks    Status  On-going       PT LONG TERM GOAL #3   Title  Patient will demonstrate 5/5 left ankle MMT in all planes to improve stability during functional tasks.     Time  4    Period  Weeks    Status  On-going      PT LONG TERM GOAL #4   Title  Patient will report ability to perform ADLs with left foot/heel pain as less than 4/10.     Time  4    Period  Weeks    Status  On-going      PT LONG TERM GOAL #5   Title  Patient will report ability to walk for greater than 20 minutes with left ankle pain less than 4/10.    Time  4    Period  Weeks    Status  On-going            Plan - 10/12/18 1412    Clinical Impression Statement  Patient presented in clinic in B tennis shoes but carrying in CAM boot upon arrival. Less TPs in inferior gastroc/achilles region today but continued TPs palpable in medial head of the gastroc. Patient very sensitive to manual therapy of the TPs with gentle pressure in medial head of the gastroc and to L arch. Passive stretching also completed today with patient VC'd to relax and breathe through stretches to reduce pain and guardng. Normal modalities response noted following removal of the modalities. Patient continues to require max assist with donning and doffing shoe and boot along with sock. Frequent involuntary LE movements detected today during treatment and two small pea sized purple bruises detected in medial gastroc. Patient attributed small bruises to previous TPR session.    Personal Factors and Comorbidities  Age;Comorbidity 3+    Comorbidities  DM, chronic kidney disease stage 3, HTN, congestive heart failure, bipolar disorder    Examination-Activity Limitations  Stairs;Stand;Locomotion Level    Stability/Clinical Decision Making  Stable/Uncomplicated    Rehab Potential  Good    PT Frequency  2x /  week    PT Duration  4 weeks    PT Treatment/Interventions  ADLs/Self Care Home Management;Electrical Stimulation;Iontophoresis 4mg /ml Dexamethasone;Moist  Heat;Ultrasound;Cryotherapy;Gait training;Stair training;Functional mobility training;Neuromuscular re-education;Balance training;Therapeutic exercise;Therapeutic activities;Patient/family education;Manual techniques;Dry needling;Passive range of motion;Taping;Vasopneumatic Device    PT Next Visit Plan  Continue TPR/STW to L calf as symptoms dictate. USE SMALL BOLSTER and requires assist with don/doff boot.    PT Home Exercise Plan  see patient education    Consulted and Agree with Plan of Care  Patient       Patient will benefit from skilled therapeutic intervention in order to improve the following deficits and impairments:  Pain, Decreased activity tolerance, Decreased endurance, Decreased range of motion, Decreased strength, Difficulty walking  Visit Diagnosis: Pain in left foot  Difficulty in walking, not elsewhere classified     Problem List Patient Active Problem List   Diagnosis Date Noted  . Obesity (BMI 30-39.9) 07/06/2018  . At risk for sleep apnea 07/06/2018  . Closed fracture of proximal end of left humerus 03/16/2018  . Left humeral fracture 03/06/2018  . Fracture of four ribs of left side, closed, initial encounter 03/06/2018  . Mild intermittent asthma without complication 47/82/9562  . Cardiomegaly 01/12/2018  . NAFL (nonalcoholic fatty liver) 13/12/6576  . Age-related osteoporosis without current pathological fracture 12/15/2017  . Asthma 11/12/2017  . Atrial fibrillation with RVR ([place]) 05/19/2017  . Microscopic colitis 05/19/2017  . Hypotension 05/19/2017  . Fibromyalgia 03/19/2017  . Healthcare maintenance 02/12/2016  . Migraine headache with aura 02/12/2016  . Diabetic neuropathy ([place]) 02/06/2016  . Depression   . Herpes genitalis in women 07/16/2015  . Chronic anticoagulation 05/30/2015  . Family history of coronary artery disease in sister 05/30/2015  . Chronic diastolic CHF (congestive heart failure) ([place]) 05/30/2015  . Chest pain with moderate risk  of acute coronary syndrome 05/29/2015  . Dyspnea on exertion   . Essential hypertension   . RLS (restless legs syndrome) 04/27/2015  . Lipoma 02/08/2015  . Bipolar disorder ([place]) 01/23/2015  . Insomnia 01/23/2015  . Chronic back pain 01/04/2015  . Permanent atrial fibrillation 01/04/2015  . DM2 (diabetes mellitus, type 2) ([place])   . Hyperlipidemia     Standley Brooking, PTA 10/12/2018, 2:31 PM  [facility] [address] [place], [place], [address] Phone: [phone]   Fax:  [phone]  Name: Amber Stephenson MRN: [id number] Date of Birth: 1945/08/18

## 2018-10-13 DIAGNOSIS — G4733 Obstructive sleep apnea (adult) (pediatric): Secondary | ICD-10-CM

## 2018-10-21 ENCOUNTER — Other Ambulatory Visit: Payer: Self-pay

## 2018-10-21 ENCOUNTER — Ambulatory Visit: Payer: Medicare Other | Admitting: *Deleted

## 2018-10-21 DIAGNOSIS — R262 Difficulty in walking, not elsewhere classified: Secondary | ICD-10-CM

## 2018-10-21 DIAGNOSIS — M79672 Pain in left foot: Secondary | ICD-10-CM

## 2018-10-21 DIAGNOSIS — L03032 Cellulitis of left toe: Secondary | ICD-10-CM | POA: Diagnosis not present

## 2018-10-21 DIAGNOSIS — M79675 Pain in left toe(s): Secondary | ICD-10-CM | POA: Diagnosis not present

## 2018-10-21 DIAGNOSIS — R2689 Other abnormalities of gait and mobility: Secondary | ICD-10-CM | POA: Diagnosis not present

## 2018-10-21 NOTE — Therapy (Signed)
[facility] [place], [place], [address] Phone: [phone]   Fax:  [phone]  Physical Therapy Treatment  Patient Details  Name: Amber Stephenson MRN: [id number] Date of Birth: 1945-09-15 Referring Provider (PT): Steffanie Rainwater, MD   Encounter Date: 10/21/2018  PT End of Session - 10/21/18 1413    Visit Number  4    Number of Visits  8    Date for PT Re-Evaluation  11/10/18    Authorization Type  Progress note every 10th visit; KX modifier after 15th visit    PT Start Time  1301    PT Stop Time  1350    PT Time Calculation (min)  49 min       Past Medical History:  Diagnosis Date  . Atrial fibrillation ([place])   . Bipolar affective ([place])   . CHF (congestive heart failure) ([place])   . Chronic back pain   . Diabetes mellitus without complication ([place])   . Hyperlipidemia   . Hypertension   . Morbid obesity due to excess calories ([place]) 07/06/2018  . Pain management   . Renal insufficiency     Past Surgical History:  Procedure Laterality Date  . BREAST REDUCTION SURGERY    . EYE SURGERY Right    cateracts  . HAMMER TOE SURGERY    . LEFT HEART CATH AND CORONARY ANGIOGRAPHY N/A 02/03/2018   Procedure: LEFT HEART CATH AND CORONARY ANGIOGRAPHY;  Surgeon: Martinique, Peter M, MD;  Location: [facility];  Service: Cardiovascular;  Laterality: N/A;  . SHOULDER SURGERY Right   . THIGH SURGERY      There were no vitals filed for this visit.  Subjective Assessment - 10/21/18 1304    Subjective  COVID 19 screening performed on patient prior to entering building. LT foot 6-7/10 today. LBP is bad also.    Pertinent History  DM, chronic kidney disease stage 3, HTN, congestive heart failure, bipolar disorder    Limitations  Standing;Walking;House hold activities    How long can you sit comfortably?  unlimited    How long can you stand comfortably?  10-20 minutes    How long can you walk comfortably?  20 minutes    Diagnostic  tests  x-ray: normal    Patient Stated Goals  get back to normal    Currently in Pain?  Yes    Pain Score  7     Pain Orientation  Left    Pain Descriptors / Indicators  Discomfort    Pain Onset  More than a month ago                       [facility] Adult PT Treatment/Exercise - 10/21/18 0001      Exercises   Exercises  Ankle      Modalities   Modalities  Electrical Stimulation;Moist Heat      Moist Heat Therapy   Number Minutes Moist Heat  15 Minutes    Moist Heat Location  Other (comment)   calf, foot     Electrical Stimulation   Electrical Stimulation Location  L gastroc, arch    Electrical Stimulation Action  IFC    Electrical Stimulation Parameters  80-150hz  x 15 min    Electrical Stimulation Goals  Pain;Tone      Manual Therapy   Manual Therapy  Myofascial release;Passive ROM    Myofascial Release  TPR/STW/IASTM to L calf, posterior tibialis, arch to reduce pronounced TPs and  muscle tightness    Passive ROM  Passive L gastroc (5x30 sec), soleus and plantar fascia stretch (3x30 sec) to improve flexibility and reduce restrictions                  PT Long Term Goals - 10/12/18 1412      PT LONG TERM GOAL #1   Title  Patient will be independent with HEP.    Time  4    Period  Weeks    Status  On-going      PT LONG TERM GOAL #2   Title  Patient will demonstrate 5 degrees of left ankle DF AROM to improve gait mechanics.    Time  4    Period  Weeks    Status  On-going      PT LONG TERM GOAL #3   Title  Patient will demonstrate 5/5 left ankle MMT in all planes to improve stability during functional tasks.     Time  4    Period  Weeks    Status  On-going      PT LONG TERM GOAL #4   Title  Patient will report ability to perform ADLs with left foot/heel pain as less than 4/10.     Time  4    Period  Weeks    Status  On-going      PT LONG TERM GOAL #5   Title  Patient will report ability to walk for greater than 20 minutes with left ankle  pain less than 4/10.    Time  4    Period  Weeks    Status  On-going            Plan - 10/21/18 1414    Clinical Impression Statement  [place] screening performed prior to entering the building. Pt reporting not wearing her CAM boot today due to heavy rain and not wanting to get it wet. She was tender along the arch of her foot as well as her heel during STW. She was also tender still along the posterior tib aspect and into medial gastroc head.. Pt reports manual stretching after STW feels good. Normal response to modalities.    Personal Factors and Comorbidities  Age;Comorbidity 3+    Comorbidities  DM, chronic kidney disease stage 3, HTN, congestive heart failure, bipolar disorder    Examination-Activity Limitations  Stairs;Stand;Locomotion Level    Stability/Clinical Decision Making  Stable/Uncomplicated    PT Treatment/Interventions  ADLs/Self Care Home Management;Electrical Stimulation;Iontophoresis 4mg /ml Dexamethasone;Moist Heat;Ultrasound;Cryotherapy;Gait training;Stair training;Functional mobility training;Neuromuscular re-education;Balance training;Therapeutic exercise;Therapeutic activities;Patient/family education;Manual techniques;Dry needling;Passive range of motion;Taping;Vasopneumatic Device    PT Next Visit Plan  Continue TPR/STW to L calf as symptoms dictate. USE SMALL BOLSTER and requires assist with don/doff boot.    PT Home Exercise Plan  see patient education    Consulted and Agree with Plan of Care  Patient       Patient will benefit from skilled therapeutic intervention in order to improve the following deficits and impairments:  Pain, Decreased activity tolerance, Decreased endurance, Decreased range of motion, Decreased strength, Difficulty walking  Visit Diagnosis: Pain in left foot  Difficulty in walking, not elsewhere classified     Problem List Patient Active Problem List   Diagnosis Date Noted  . Obesity (BMI 30-39.9) 07/06/2018  . At risk for  sleep apnea 07/06/2018  . Closed fracture of proximal end of left humerus 03/16/2018  . Left humeral fracture 03/06/2018  . Fracture of four ribs of left  side, closed, initial encounter 03/06/2018  . Mild intermittent asthma without complication 85/63/1497  . Cardiomegaly 01/12/2018  . NAFL (nonalcoholic fatty liver) 02/63/7858  . Age-related osteoporosis without current pathological fracture 12/15/2017  . Asthma 11/12/2017  . Atrial fibrillation with RVR ([place]) 05/19/2017  . Microscopic colitis 05/19/2017  . Hypotension 05/19/2017  . Fibromyalgia 03/19/2017  . Healthcare maintenance 02/12/2016  . Migraine headache with aura 02/12/2016  . Diabetic neuropathy ([place]) 02/06/2016  . Depression   . Herpes genitalis in women 07/16/2015  . Chronic anticoagulation 05/30/2015  . Family history of coronary artery disease in sister 05/30/2015  . Chronic diastolic CHF (congestive heart failure) ([place]) 05/30/2015  . Chest pain with moderate risk of acute coronary syndrome 05/29/2015  . Dyspnea on exertion   . Essential hypertension   . RLS (restless legs syndrome) 04/27/2015  . Lipoma 02/08/2015  . Bipolar disorder ([place]) 01/23/2015  . Insomnia 01/23/2015  . Chronic back pain 01/04/2015  . Permanent atrial fibrillation 01/04/2015  . DM2 (diabetes mellitus, type 2) ([place])   . Hyperlipidemia     Alazae Crymes,CHRIS, PTA 10/21/2018, 2:24 PM  [facility] [address] [place], [place], [address] Phone: [phone]   Fax:  [phone]  Name: Amber Stephenson MRN: [id number] Date of Birth: 01-02-1946

## 2018-10-22 ENCOUNTER — Other Ambulatory Visit: Payer: Self-pay

## 2018-10-22 ENCOUNTER — Other Ambulatory Visit (HOSPITAL_COMMUNITY): Payer: Self-pay | Admitting: Family Medicine

## 2018-10-22 DIAGNOSIS — Z1231 Encounter for screening mammogram for malignant neoplasm of breast: Secondary | ICD-10-CM

## 2018-10-26 ENCOUNTER — Other Ambulatory Visit: Payer: Self-pay

## 2018-10-26 ENCOUNTER — Ambulatory Visit (INDEPENDENT_AMBULATORY_CARE_PROVIDER_SITE_OTHER): Payer: Medicare Other | Admitting: Family Medicine

## 2018-10-26 ENCOUNTER — Encounter: Payer: Self-pay | Admitting: Family Medicine

## 2018-10-26 VITALS — BP 139/81 | HR 74 | Temp 97.1°F | Ht 60.0 in

## 2018-10-26 DIAGNOSIS — G8929 Other chronic pain: Secondary | ICD-10-CM

## 2018-10-26 DIAGNOSIS — M5441 Lumbago with sciatica, right side: Secondary | ICD-10-CM | POA: Diagnosis not present

## 2018-10-26 DIAGNOSIS — B372 Candidiasis of skin and nail: Secondary | ICD-10-CM

## 2018-10-26 MED ORDER — METHYLPREDNISOLONE ACETATE 40 MG/ML IJ SUSP
40.0000 mg | Freq: Once | INTRAMUSCULAR | Status: AC
  Administered 2018-10-26: 40 mg via INTRAMUSCULAR

## 2018-10-26 MED ORDER — TIZANIDINE HCL 2 MG PO CAPS: 2.0000 mg | ORAL_CAPSULE | Freq: Three times a day (TID) | ORAL | 0 refills | Status: DC | PRN

## 2018-10-26 MED ORDER — NYSTATIN 100000 UNIT/GM EX POWD: Freq: Three times a day (TID) | CUTANEOUS | 1 refills | Status: DC
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
## 2018-10-28 ENCOUNTER — Encounter: Payer: Self-pay | Admitting: Physical Therapy

## 2018-10-28 ENCOUNTER — Other Ambulatory Visit: Payer: Self-pay

## 2018-10-28 ENCOUNTER — Ambulatory Visit: Payer: Medicare Other | Admitting: Physical Therapy

## 2018-10-28 DIAGNOSIS — R262 Difficulty in walking, not elsewhere classified: Secondary | ICD-10-CM

## 2018-10-28 DIAGNOSIS — M79672 Pain in left foot: Secondary | ICD-10-CM

## 2018-10-28 DIAGNOSIS — R2689 Other abnormalities of gait and mobility: Secondary | ICD-10-CM

## 2018-10-28 NOTE — Therapy (Signed)
[facility] [place], [place], [address] Phone: [phone]   Fax:  [phone]  Physical Therapy Treatment  Patient Details  Name: Amber Stephenson MRN: [id number] Date of Birth: 07/21/1945 Referring Provider (PT): Steffanie Rainwater, MD   Encounter Date: 10/28/2018  PT End of Session - 10/28/18 1410    Visit Number  5    Number of Visits  8    Date for PT Re-Evaluation  11/10/18    Authorization Type  Progress note every 10th visit; KX modifier after 15th visit    PT Start Time  1410    PT Stop Time  1455    PT Time Calculation (min)  45 min    Activity Tolerance  Patient limited by pain;Patient tolerated treatment well    Behavior During Therapy  [facility] for tasks assessed/performed       Past Medical History:  Diagnosis Date  . Atrial fibrillation ([place])   . Bipolar affective ([place])   . CHF (congestive heart failure) ([place])   . Chronic back pain   . Diabetes mellitus without complication ([place])   . Hyperlipidemia   . Hypertension   . Morbid obesity due to excess calories ([place]) 07/06/2018  . Pain management   . Renal insufficiency     Past Surgical History:  Procedure Laterality Date  . BREAST REDUCTION SURGERY    . EYE SURGERY Right    cateracts  . HAMMER TOE SURGERY    . LEFT HEART CATH AND CORONARY ANGIOGRAPHY N/A 02/03/2018   Procedure: LEFT HEART CATH AND CORONARY ANGIOGRAPHY;  Surgeon: Martinique, Peter M, MD;  Location: [facility];  Service: Cardiovascular;  Laterality: N/A;  . SHOULDER SURGERY Right   . THIGH SURGERY      There were no vitals filed for this visit.  Subjective Assessment - 10/28/18 1407    Subjective  COVID 19 screening performed on patient prior to entering building. Reports that she recieved injection as she had muscle guarding in L thigh. Now has muscle relaxer.    Pertinent History  DM, chronic kidney disease stage 3, HTN, congestive heart failure, bipolar disorder    Limitations   Standing;Walking;House hold activities    How long can you sit comfortably?  unlimited    How long can you stand comfortably?  10-20 minutes    How long can you walk comfortably?  20 minutes    Diagnostic tests  x-ray: normal    Patient Stated Goals  get back to normal    Currently in Pain?  Yes    Pain Score  5     Pain Location  Foot    Pain Orientation  Left    Pain Descriptors / Indicators  Discomfort    Pain Type  Chronic pain    Pain Onset  More than a month ago         [facility] PT Assessment - 10/28/18 0001      Assessment   Medical Diagnosis  left plantar fasciitis    Referring Provider (PT)  Steffanie Rainwater, MD    Onset Date/Surgical Date  05/02/18    Next MD Visit  "3 weeks"    Prior Therapy  no      Precautions   Precautions  None      Restrictions   Weight Bearing Restrictions  No                   [facility] Adult PT Treatment/Exercise - 10/28/18  0001      Modalities   Modalities  Electrical Stimulation;Moist Heat      Moist Heat Therapy   Number Minutes Moist Heat  15 Minutes    Moist Heat Location  Other (comment)   calf, foot     Electrical Stimulation   Electrical Stimulation Location  L gastroc, arch    Electrical Stimulation Action  IFC    Electrical Stimulation Parameters  80-150 hz x15 min    Electrical Stimulation Goals  Pain;Tone      Manual Therapy   Manual Therapy  Myofascial release;Passive ROM    Myofascial Release  TPR/STW to L calf, posterior tibialis, arch to reduce pronounced TPs and muscle tightness    Passive ROM  Passive L gastroc, soleus, PF stretch 3x60 sec                  PT Long Term Goals - 10/12/18 1412      PT LONG TERM GOAL #1   Title  Patient will be independent with HEP.    Time  4    Period  Weeks    Status  On-going      PT LONG TERM GOAL #2   Title  Patient will demonstrate 5 degrees of left ankle DF AROM to improve gait mechanics.    Time  4    Period  Weeks    Status  On-going      PT  LONG TERM GOAL #3   Title  Patient will demonstrate 5/5 left ankle MMT in all planes to improve stability during functional tasks.     Time  4    Period  Weeks    Status  On-going      PT LONG TERM GOAL #4   Title  Patient will report ability to perform ADLs with left foot/heel pain as less than 4/10.     Time  4    Period  Weeks    Status  On-going      PT LONG TERM GOAL #5   Title  Patient will report ability to walk for greater than 20 minutes with left ankle pain less than 4/10.    Time  4    Period  Weeks    Status  On-going            Plan - 10/28/18 1502    Clinical Impression Statement  Patient presented in clinic with reports of initially no L foot pain which she then retracted to 5/10. Patient reporting greater discomfort of L great toe secondary to an ingrown toenail removal. Patient still very sensitive and tender to L posterior tibialis and medial gastroc TPs proximally. Passive stretching continued with patient verbalizing that stretching "felt good." Patient did not have CAM boot donned upon arrival in clinic. Normal modalities response noted following removal of the modalities.    Personal Factors and Comorbidities  Age;Comorbidity 3+    Comorbidities  DM, chronic kidney disease stage 3, HTN, congestive heart failure, bipolar disorder    Examination-Activity Limitations  Stairs;Stand;Locomotion Level    Stability/Clinical Decision Making  Stable/Uncomplicated    Rehab Potential  Good    PT Frequency  2x / week    PT Duration  4 weeks    PT Treatment/Interventions  ADLs/Stephenson Care Home Management;Electrical Stimulation;Iontophoresis 4mg /ml Dexamethasone;Moist Heat;Ultrasound;Cryotherapy;Gait training;Stair training;Functional mobility training;Neuromuscular re-education;Balance training;Therapeutic exercise;Therapeutic activities;Patient/family education;Manual techniques;Dry needling;Passive range of motion;Taping;Vasopneumatic Device    PT Next Visit Plan  Continue  TPR/STW to L calf  as symptoms dictate. USE SMALL BOLSTER and requires assist with don/doff boot.    PT Home Exercise Plan  see patient education    Consulted and Agree with Plan of Care  Patient       Patient will benefit from skilled therapeutic intervention in order to improve the following deficits and impairments:  Pain, Decreased activity tolerance, Decreased endurance, Decreased range of motion, Decreased strength, Difficulty walking  Visit Diagnosis: Pain in left foot  Difficulty in walking, not elsewhere classified  Other abnormalities of gait and mobility     Problem List Patient Active Problem List   Diagnosis Date Noted  . Obesity (BMI 30-39.9) 07/06/2018  . At risk for sleep apnea 07/06/2018  . Closed fracture of proximal end of left humerus 03/16/2018  . Left humeral fracture 03/06/2018  . Fracture of four ribs of left side, closed, initial encounter 03/06/2018  . Mild intermittent asthma without complication 70/48/8891  . Cardiomegaly 01/12/2018  . NAFL (nonalcoholic fatty liver) 69/45/0388  . Age-related osteoporosis without current pathological fracture 12/15/2017  . Asthma 11/12/2017  . Atrial fibrillation with RVR ([place]) 05/19/2017  . Microscopic colitis 05/19/2017  . Hypotension 05/19/2017  . Fibromyalgia 03/19/2017  . Healthcare maintenance 02/12/2016  . Migraine headache with aura 02/12/2016  . Diabetic neuropathy ([place]) 02/06/2016  . Depression   . Herpes genitalis in women 07/16/2015  . Chronic anticoagulation 05/30/2015  . Family history of coronary artery disease in sister 05/30/2015  . Chronic diastolic CHF (congestive heart failure) ([place]) 05/30/2015  . Chest pain with moderate risk of acute coronary syndrome 05/29/2015  . Dyspnea on exertion   . Essential hypertension   . RLS (restless legs syndrome) 04/27/2015  . Lipoma 02/08/2015  . Bipolar disorder ([place]) 01/23/2015  . Insomnia 01/23/2015  . Chronic back pain 01/04/2015  . Permanent atrial  fibrillation 01/04/2015  . DM2 (diabetes mellitus, type 2) ([place])   . Hyperlipidemia     Standley Brooking, PTA 10/28/2018, 3:06 PM  [facility] [address] [place], [place], [address] Phone: [phone]   Fax:  [phone]  Name: Amber Stephenson MRN: [id number] Date of Birth: 1946-05-24

## 2018-11-02 ENCOUNTER — Other Ambulatory Visit: Payer: Self-pay

## 2018-11-02 ENCOUNTER — Ambulatory Visit: Payer: Medicare Other | Attending: Podiatry | Admitting: Physical Therapy

## 2018-11-02 DIAGNOSIS — M79672 Pain in left foot: Secondary | ICD-10-CM | POA: Insufficient documentation

## 2018-11-02 DIAGNOSIS — R262 Difficulty in walking, not elsewhere classified: Secondary | ICD-10-CM | POA: Insufficient documentation

## 2018-11-02 NOTE — Therapy (Signed)
[facility] [place], [place], [address] Phone: [phone]   Fax:  [phone]  Physical Therapy Treatment  Patient Details  Name: Amber Stephenson MRN: [id number] Date of Birth: 1945-12-19 Referring Provider (PT): Steffanie Rainwater, MD   Encounter Date: 11/02/2018  PT End of Session - 11/02/18 1448    Visit Number  6    Number of Visits  8    Date for PT Re-Evaluation  11/10/18    Authorization Type  Progress note every 10th visit; KX modifier after 15th visit    PT Start Time  0100    PT Stop Time  0155    PT Time Calculation (min)  55 min       Past Medical History:  Diagnosis Date  . Atrial fibrillation ([place])   . Bipolar affective ([place])   . CHF (congestive heart failure) ([place])   . Chronic back pain   . Diabetes mellitus without complication ([place])   . Hyperlipidemia   . Hypertension   . Morbid obesity due to excess calories ([place]) 07/06/2018  . Pain management   . Renal insufficiency     Past Surgical History:  Procedure Laterality Date  . BREAST REDUCTION SURGERY    . EYE SURGERY Right    cateracts  . HAMMER TOE SURGERY    . LEFT HEART CATH AND CORONARY ANGIOGRAPHY N/A 02/03/2018   Procedure: LEFT HEART CATH AND CORONARY ANGIOGRAPHY;  Surgeon: Martinique, Peter M, MD;  Location: [facility];  Service: Cardiovascular;  Laterality: N/A;  . SHOULDER SURGERY Right   . THIGH SURGERY      There were no vitals filed for this visit.  Subjective Assessment - 11/02/18 1447    Subjective  Patient will benefit from skilled physical therapy intervention to address deficits.  Pain at a 7 today.    Pertinent History  DM, chronic kidney disease stage 3, HTN, congestive heart failure, bipolar disorder    Limitations  Standing;Walking;House hold activities    How long can you sit comfortably?  unlimited    How long can you stand comfortably?  10-20 minutes    How long can you walk comfortably?  20 minutes    Diagnostic tests   x-ray: normal    Patient Stated Goals  get back to normal    Currently in Pain?  Yes    Pain Score  7     Pain Location  Foot    Pain Orientation  Left    Pain Descriptors / Indicators  Discomfort    Pain Type  Chronic pain    Pain Onset  More than a month ago                       [facility] Adult PT Treatment/Exercise - 11/02/18 0001      Modalities   Modalities  Electrical Stimulation;Moist Heat;Ultrasound      Moist Heat Therapy   Number Minutes Moist Heat  20 Minutes    Moist Heat Location  --   Left foot.     Acupuncturist Location  --   Left foot plantar surface.   Electrical Stimulation Action  Pre-mod.    Electrical Stimulation Parameters  80-150 Hz x 20 minutes.    Electrical Stimulation Goals  Tone;Pain      Ultrasound   Ultrasound Location  --   Left foot plantar surface.   Ultrasound Parameters  Combo e'stim/U/S at 1.50  W/CM2 x 12 minutes.      Manual Therapy   Manual Therapy  Soft tissue mobilization    Soft tissue mobilization  STW/M including IASTM x 12 minutes to plantar surface of left foot.                  PT Long Term Goals - 10/12/18 1412      PT LONG TERM GOAL #1   Title  Patient will be independent with HEP.    Time  4    Period  Weeks    Status  On-going      PT LONG TERM GOAL #2   Title  Patient will demonstrate 5 degrees of left ankle DF AROM to improve gait mechanics.    Time  4    Period  Weeks    Status  On-going      PT LONG TERM GOAL #3   Title  Patient will demonstrate 5/5 left ankle MMT in all planes to improve stability during functional tasks.     Time  4    Period  Weeks    Status  On-going      PT LONG TERM GOAL #4   Title  Patient will report ability to perform ADLs with left foot/heel pain as less than 4/10.     Time  4    Period  Weeks    Status  On-going      PT LONG TERM GOAL #5   Title  Patient will report ability to walk for greater than 20 minutes  with left ankle pain less than 4/10.    Time  4    Period  Weeks    Status  On-going            Plan - 11/02/18 1452    Clinical Impression Statement  Patient did well with treatment today.  She was very tender to palpation over her left post tib just has it enters the arch of her left foot.    Personal Factors and Comorbidities  Age;Comorbidity 3+    Comorbidities  DM, chronic kidney disease stage 3, HTN, congestive heart failure, bipolar disorder    Examination-Activity Limitations  Stairs;Stand;Locomotion Level    Rehab Potential  Good    PT Frequency  2x / week    PT Duration  4 weeks    PT Treatment/Interventions  ADLs/Self Care Home Management;Electrical Stimulation;Iontophoresis 4mg /ml Dexamethasone;Moist Heat;Ultrasound;Cryotherapy;Gait training;Stair training;Functional mobility training;Neuromuscular re-education;Balance training;Therapeutic exercise;Therapeutic activities;Patient/family education;Manual techniques;Dry needling;Passive range of motion;Taping;Vasopneumatic Device    PT Next Visit Plan  Continue TPR/STW to L calf as symptoms dictate. USE SMALL BOLSTER and requires assist with don/doff boot.    PT Home Exercise Plan  see patient education    Consulted and Agree with Plan of Care  Patient       Patient will benefit from skilled therapeutic intervention in order to improve the following deficits and impairments:  Pain, Decreased activity tolerance, Decreased endurance, Decreased range of motion, Decreased strength, Difficulty walking  Visit Diagnosis: Pain in left foot  Difficulty in walking, not elsewhere classified     Problem List Patient Active Problem List   Diagnosis Date Noted  . Obesity (BMI 30-39.9) 07/06/2018  . At risk for sleep apnea 07/06/2018  . Closed fracture of proximal end of left humerus 03/16/2018  . Left humeral fracture 03/06/2018  . Fracture of four ribs of left side, closed, initial encounter 03/06/2018  . Mild intermittent  asthma without complication 86/76/1950  .  Cardiomegaly 01/12/2018  . NAFL (nonalcoholic fatty liver) 42/70/6237  . Age-related osteoporosis without current pathological fracture 12/15/2017  . Asthma 11/12/2017  . Atrial fibrillation with RVR ([place]) 05/19/2017  . Microscopic colitis 05/19/2017  . Hypotension 05/19/2017  . Fibromyalgia 03/19/2017  . Healthcare maintenance 02/12/2016  . Migraine headache with aura 02/12/2016  . Diabetic neuropathy ([place]) 02/06/2016  . Depression   . Herpes genitalis in women 07/16/2015  . Chronic anticoagulation 05/30/2015  . Family history of coronary artery disease in sister 05/30/2015  . Chronic diastolic CHF (congestive heart failure) ([place]) 05/30/2015  . Chest pain with moderate risk of acute coronary syndrome 05/29/2015  . Dyspnea on exertion   . Essential hypertension   . RLS (restless legs syndrome) 04/27/2015  . Lipoma 02/08/2015  . Bipolar disorder ([place]) 01/23/2015  . Insomnia 01/23/2015  . Chronic back pain 01/04/2015  . Permanent atrial fibrillation 01/04/2015  . DM2 (diabetes mellitus, type 2) ([place])   . Hyperlipidemia     Acasia Skilton, [place] MPT 11/02/2018, 2:53 PM  [facility] [address] [place], [place], [address] Phone: [phone]   Fax:  [phone]  Name: ILLYANNA PETILLO MRN: [id number] Date of Birth: 23-Jul-1945

## 2018-11-03 DIAGNOSIS — M419 Scoliosis, unspecified: Secondary | ICD-10-CM | POA: Diagnosis not present

## 2018-11-03 DIAGNOSIS — M5136 Other intervertebral disc degeneration, lumbar region: Secondary | ICD-10-CM | POA: Diagnosis not present

## 2018-11-03 DIAGNOSIS — Z9189 Other specified personal risk factors, not elsewhere classified: Secondary | ICD-10-CM | POA: Diagnosis not present

## 2018-11-03 DIAGNOSIS — M545 Low back pain: Secondary | ICD-10-CM | POA: Diagnosis not present

## 2018-11-04 ENCOUNTER — Encounter: Payer: Self-pay | Admitting: Physical Therapy

## 2018-11-04 ENCOUNTER — Ambulatory Visit: Payer: Medicare Other | Admitting: Physical Therapy

## 2018-11-04 ENCOUNTER — Other Ambulatory Visit: Payer: Self-pay | Admitting: Cardiovascular Disease

## 2018-11-04 DIAGNOSIS — M79672 Pain in left foot: Secondary | ICD-10-CM

## 2018-11-04 DIAGNOSIS — R262 Difficulty in walking, not elsewhere classified: Secondary | ICD-10-CM | POA: Diagnosis not present

## 2018-11-04 NOTE — Therapy (Signed)
[facility] [place], [place], [address] Phone: [phone]   Fax:  [phone]  Physical Therapy Treatment  Patient Details  Name: Amber Stephenson MRN: [id number] Date of Birth: 10-Jun-1945 Referring Provider (PT): Steffanie Rainwater, MD   Encounter Date: 11/04/2018  PT End of Session - 11/04/18 1402    Visit Number  7    Number of Visits  8    Date for PT Re-Evaluation  11/10/18    Authorization Type  Progress note every 10th visit; KX modifier after 15th visit    PT Start Time  1402    PT Stop Time  1448    PT Time Calculation (min)  46 min    Activity Tolerance  Patient limited by pain;Patient tolerated treatment well    Behavior During Therapy  [facility] for tasks assessed/performed       Past Medical History:  Diagnosis Date  . Atrial fibrillation ([place])   . Bipolar affective ([place])   . CHF (congestive heart failure) ([place])   . Chronic back pain   . Diabetes mellitus without complication ([place])   . Hyperlipidemia   . Hypertension   . Morbid obesity due to excess calories ([place]) 07/06/2018  . Pain management   . Renal insufficiency     Past Surgical History:  Procedure Laterality Date  . BREAST REDUCTION SURGERY    . EYE SURGERY Right    cateracts  . HAMMER TOE SURGERY    . LEFT HEART CATH AND CORONARY ANGIOGRAPHY N/A 02/03/2018   Procedure: LEFT HEART CATH AND CORONARY ANGIOGRAPHY;  Surgeon: Martinique, Peter M, MD;  Location: [facility];  Service: Cardiovascular;  Laterality: N/A;  . SHOULDER SURGERY Right   . THIGH SURGERY      There were no vitals filed for this visit.  Subjective Assessment - 11/04/18 1401    Subjective  COVID 19 screening performed on patient upon arrival. Reports that R great toe pain continues but has not contacted Dr. Irving Shows. Reports less pain in R PF since last treatment as well as less calf pain.    Pertinent History  DM, chronic kidney disease stage 3, HTN, congestive heart failure, bipolar  disorder    Limitations  Standing;Walking;House hold activities    How long can you sit comfortably?  unlimited    How long can you stand comfortably?  10-20 minutes    How long can you walk comfortably?  20 minutes    Diagnostic tests  x-ray: normal    Patient Stated Goals  get back to normal    Currently in Pain?  Yes    Pain Score  5     Pain Location  Foot    Pain Orientation  Left    Pain Descriptors / Indicators  Discomfort    Pain Type  Chronic pain    Pain Onset  More than a month ago    Pain Frequency  Constant         [facility] PT Assessment - 11/04/18 0001      Assessment   Medical Diagnosis  left plantar fasciitis    Referring Provider (PT)  Steffanie Rainwater, MD    Onset Date/Surgical Date  05/02/18    Next MD Visit  "3 weeks"    Prior Therapy  no      Precautions   Precautions  None      Restrictions   Weight Bearing Restrictions  No  [facility] Adult PT Treatment/Exercise - 11/04/18 0001      Modalities   Modalities  Electrical Stimulation;Moist Heat;Ultrasound      Moist Heat Therapy   Number Minutes Moist Heat  15 Minutes    Moist Heat Location  Ankle      Electrical Stimulation   Electrical Stimulation Location  L achilles, arch    Electrical Stimulation Action  IFC    Electrical Stimulation Parameters  80-150 hz x15 min    Electrical Stimulation Goals  Tone;Pain      Ultrasound   Ultrasound Location  L PF    Ultrasound Parameters  1.5 w/cm2, 100%, 1 mhz x10 min    Ultrasound Goals  Pain      Manual Therapy   Manual Therapy  Soft tissue mobilization    Soft tissue mobilization  STW/IASTW to L arch, achilles, superior gastroc to reduce tightness and pain                  PT Long Term Goals - 10/12/18 1412      PT LONG TERM GOAL #1   Title  Patient will be independent with HEP.    Time  4    Period  Weeks    Status  On-going      PT LONG TERM GOAL #2   Title  Patient will demonstrate 5 degrees of left ankle  DF AROM to improve gait mechanics.    Time  4    Period  Weeks    Status  On-going      PT LONG TERM GOAL #3   Title  Patient will demonstrate 5/5 left ankle MMT in all planes to improve stability during functional tasks.     Time  4    Period  Weeks    Status  On-going      PT LONG TERM GOAL #4   Title  Patient will report ability to perform ADLs with left foot/heel pain as less than 4/10.     Time  4    Period  Weeks    Status  On-going      PT LONG TERM GOAL #5   Title  Patient will report ability to walk for greater than 20 minutes with left ankle pain less than 4/10.    Time  4    Period  Weeks    Status  On-going            Plan - 11/04/18 1441    Clinical Impression Statement  Patient presented in clinic today with 5/10 L foot pain. Patient still tender to palpation over L PF and achilles. Less TPs palpable in L gastroc muscle bellies today although tender still along line of posterior tibialis. Gentle IASTW completed to L PF and achilles tendon to reduce tightness and adhesions. Patient educated that soreness may present following manual therapy. Normal modalities response noted following removal of the modalities.    Personal Factors and Comorbidities  Age;Comorbidity 3+    Comorbidities  DM, chronic kidney disease stage 3, HTN, congestive heart failure, bipolar disorder    Examination-Activity Limitations  Stairs;Stand;Locomotion Level    Stability/Clinical Decision Making  Stable/Uncomplicated    Rehab Potential  Good    PT Frequency  2x / week    PT Duration  4 weeks    PT Treatment/Interventions  ADLs/Self Care Home Management;Electrical Stimulation;Iontophoresis 4mg /ml Dexamethasone;Moist Heat;Ultrasound;Cryotherapy;Gait training;Stair training;Functional mobility training;Neuromuscular re-education;Balance training;Therapeutic exercise;Therapeutic activities;Patient/family education;Manual techniques;Dry needling;Passive range of motion;Taping;Vasopneumatic  Device  PT Next Visit Plan  Continue TPR/STW to L calf as symptoms dictate. USE SMALL BOLSTER and requires assist with don/doff boot.    PT Home Exercise Plan  see patient education    Consulted and Agree with Plan of Care  Patient       Patient will benefit from skilled therapeutic intervention in order to improve the following deficits and impairments:  Pain, Decreased activity tolerance, Decreased endurance, Decreased range of motion, Decreased strength, Difficulty walking  Visit Diagnosis: Pain in left foot  Difficulty in walking, not elsewhere classified     Problem List Patient Active Problem List   Diagnosis Date Noted  . Obesity (BMI 30-39.9) 07/06/2018  . At risk for sleep apnea 07/06/2018  . Closed fracture of proximal end of left humerus 03/16/2018  . Left humeral fracture 03/06/2018  . Fracture of four ribs of left side, closed, initial encounter 03/06/2018  . Mild intermittent asthma without complication 23/30/0762  . Cardiomegaly 01/12/2018  . NAFL (nonalcoholic fatty liver) 26/33/3545  . Age-related osteoporosis without current pathological fracture 12/15/2017  . Asthma 11/12/2017  . Atrial fibrillation with RVR ([place]) 05/19/2017  . Microscopic colitis 05/19/2017  . Hypotension 05/19/2017  . Fibromyalgia 03/19/2017  . Healthcare maintenance 02/12/2016  . Migraine headache with aura 02/12/2016  . Diabetic neuropathy ([place]) 02/06/2016  . Depression   . Herpes genitalis in women 07/16/2015  . Chronic anticoagulation 05/30/2015  . Family history of coronary artery disease in sister 05/30/2015  . Chronic diastolic CHF (congestive heart failure) ([place]) 05/30/2015  . Chest pain with moderate risk of acute coronary syndrome 05/29/2015  . Dyspnea on exertion   . Essential hypertension   . RLS (restless legs syndrome) 04/27/2015  . Lipoma 02/08/2015  . Bipolar disorder ([place]) 01/23/2015  . Insomnia 01/23/2015  . Chronic back pain 01/04/2015  . Permanent atrial  fibrillation 01/04/2015  . DM2 (diabetes mellitus, type 2) ([place])   . Hyperlipidemia     Standley Brooking, PTA 11/04/2018, 2:56 PM  [facility] [address] [place], [place], [address] Phone: [phone]   Fax:  [phone]  Name: Amber Stephenson MRN: [id number] Date of Birth: [birth year]

## 2018-11-10 LAB — HM DIABETES EYE EXAM

## 2018-11-13 ENCOUNTER — Other Ambulatory Visit: Payer: Self-pay | Admitting: Cardiovascular Disease

## 2018-11-15 ENCOUNTER — Other Ambulatory Visit: Payer: Self-pay | Admitting: Family Medicine

## 2018-11-15 DIAGNOSIS — E782 Mixed hyperlipidemia: Secondary | ICD-10-CM

## 2018-11-15 DIAGNOSIS — H524 Presbyopia: Secondary | ICD-10-CM | POA: Diagnosis not present

## 2018-11-15 DIAGNOSIS — I5032 Chronic diastolic (congestive) heart failure: Secondary | ICD-10-CM

## 2018-11-15 DIAGNOSIS — Z961 Presence of intraocular lens: Secondary | ICD-10-CM | POA: Diagnosis not present

## 2018-11-15 DIAGNOSIS — E119 Type 2 diabetes mellitus without complications: Secondary | ICD-10-CM | POA: Diagnosis not present

## 2018-11-15 DIAGNOSIS — I4821 Permanent atrial fibrillation: Secondary | ICD-10-CM

## 2018-11-17 ENCOUNTER — Other Ambulatory Visit: Payer: Self-pay

## 2018-11-17 ENCOUNTER — Ambulatory Visit (HOSPITAL_COMMUNITY)
Admission: RE | Admit: 2018-11-17 | Discharge: 2018-11-17 | Disposition: A | Discharge status: Home / Self Care | Payer: Medicare Other | Source: Ambulatory Visit | Attending: Family Medicine | Admitting: Family Medicine

## 2018-11-17 ENCOUNTER — Telehealth: Payer: Self-pay | Admitting: *Deleted

## 2018-11-17 DIAGNOSIS — Z1231 Encounter for screening mammogram for malignant neoplasm of breast: Secondary | ICD-10-CM | POA: Insufficient documentation

## 2018-11-17 MED ORDER — ALBUTEROL SULFATE HFA 108 (90 BASE) MCG/ACT IN AERS: 2.0000 | INHALATION_SPRAY | Freq: Four times a day (QID) | RESPIRATORY_TRACT | 6 refills | Status: DC | PRN

## 2018-11-17 NOTE — Telephone Encounter (Signed)
Contacted patient by phone regarding usage of combivent respimat.  Informed patient there was a request for refill on Combivent from [facility] and our provider had reviewed the refill and commented that due to patient's last PFT result, she could discontinue the combivent and just try using the albuterol rescue inhaler, as needed.  Patient states she still has the combivent and uses at times but was agreeable to stopping the combivent and only using albuterol, as needed.    Patient voiced understanding of above and will contact us if she develops any concerns using just the albuterol prn.    Also contacted [facility] to deny refill on combivent.  Nothing further needed for this encounter.

## 2018-11-17 NOTE — Telephone Encounter (Signed)
Unsure if she is still taking.  She is seen by pulmonology, please send refill request to that office.

## 2018-11-17 NOTE — Telephone Encounter (Signed)
[facility] is asking for a refill on on combivent respimat inhaler 4 g.  This is not on current med list - please address

## 2018-11-17 NOTE — Telephone Encounter (Signed)
Combivent is not on her active medication list, no indication for LAMA based on PFTs. Recommend Albuterol rescue inhaler to use as needed for breakthrough shortness of breath/wheezing.

## 2018-11-18 ENCOUNTER — Telehealth: Payer: Self-pay | Admitting: Neurology

## 2018-11-18 NOTE — Telephone Encounter (Signed)
Headaches more frequent but not more severe. Pt request increase TOPAMAX pt states is currently taking 3 tablets. Uses [facility] in [place]. Pt request confirmation phone [phone].

## 2018-11-19 MED ORDER — TOPIRAMATE 25 MG PO TABS: ORAL_TABLET | ORAL | 11 refills | Status: DC

## 2018-11-19 NOTE — Telephone Encounter (Signed)
Pls let her know to increase Topamax 25mg : Take 1 tab in AM, 3 tabs in PM. Pls send in updated Rx. Update [place] how she is feeling in 2 months. Thanks

## 2018-11-19 NOTE — Addendum Note (Signed)
Addended by: Jake Seats on: 11/19/2018 09:06 AM   Modules accepted: Orders

## 2018-11-19 NOTE — Telephone Encounter (Signed)
increase Topamax 25mg : Take 1 tab in AM, 3 tabs in PM. Pls send in updated Rx. Update [place] how she is feeling in 2 months pt verbalized understanding, informed to call us back before 2 months if she needs [place].

## 2018-11-22 ENCOUNTER — Other Ambulatory Visit: Payer: Self-pay | Admitting: Family Medicine

## 2018-11-23 DIAGNOSIS — L600 Ingrowing nail: Secondary | ICD-10-CM | POA: Diagnosis not present

## 2018-11-23 DIAGNOSIS — L03032 Cellulitis of left toe: Secondary | ICD-10-CM | POA: Diagnosis not present

## 2018-11-23 DIAGNOSIS — M79675 Pain in left toe(s): Secondary | ICD-10-CM | POA: Diagnosis not present

## 2018-11-26 ENCOUNTER — Ambulatory Visit: Payer: Medicare Other | Admitting: Physical Therapy

## 2018-11-26 ENCOUNTER — Other Ambulatory Visit: Payer: Self-pay

## 2018-11-26 DIAGNOSIS — M79672 Pain in left foot: Secondary | ICD-10-CM

## 2018-11-26 DIAGNOSIS — R262 Difficulty in walking, not elsewhere classified: Secondary | ICD-10-CM

## 2018-11-26 NOTE — Therapy (Signed)
[facility] [place], [place], [address] Phone: [phone]   Fax:  [phone]  Physical Therapy Treatment  Patient Details  Name: Amber Stephenson MRN: [id number] Date of Birth: 07-11-1945 Referring Provider (PT): Steffanie Rainwater, MD   Encounter Date: 11/26/2018  PT End of Session - 11/26/18 1211    Visit Number  8    Number of Visits  12    Date for PT Re-Evaluation  12/31/18    Authorization Type  Progress note every 10th visit; KX modifier after 15th visit    PT Start Time  1115    PT Stop Time  1204    PT Time Calculation (min)  49 min    Activity Tolerance  Patient limited by pain;Patient tolerated treatment well    Behavior During Therapy  [facility] for tasks assessed/performed       Past Medical History:  Diagnosis Date  . Atrial fibrillation ([place])   . Bipolar affective ([place])   . CHF (congestive heart failure) ([place])   . Chronic back pain   . Diabetes mellitus without complication ([place])   . Hyperlipidemia   . Hypertension   . Morbid obesity due to excess calories ([place]) 07/06/2018  . Pain management   . Renal insufficiency     Past Surgical History:  Procedure Laterality Date  . BREAST REDUCTION SURGERY    . EYE SURGERY Right    cateracts  . HAMMER TOE SURGERY    . LEFT HEART CATH AND CORONARY ANGIOGRAPHY N/A 02/03/2018   Procedure: LEFT HEART CATH AND CORONARY ANGIOGRAPHY;  Surgeon: Martinique, Peter M, MD;  Location: [facility];  Service: Cardiovascular;  Laterality: N/A;  . SHOULDER SURGERY Right   . THIGH SURGERY      There were no vitals filed for this visit.  Subjective Assessment - 11/26/18 1227    Subjective  COVID 19 screening performed on patient upon arrival. Patient arrives stating 50% improvement since start of therapy.    Pertinent History  DM, chronic kidney disease stage 3, HTN, congestive heart failure, bipolar disorder    Limitations  Standing;Walking;House hold activities    How long can  you sit comfortably?  unlimited    How long can you stand comfortably?  10-20 minutes    How long can you walk comfortably?  20 minutes; 30 minutes 11/26/2018    Diagnostic tests  x-ray: normal    Patient Stated Goals  get back to normal    Currently in Pain?  Yes    Pain Score  5     Pain Location  Foot    Pain Orientation  Left    Pain Descriptors / Indicators  Discomfort    Pain Type  Chronic pain    Pain Onset  More than a month ago    Pain Frequency  Constant         [facility] PT Assessment - 11/26/18 0001      AROM   Left Ankle Dorsiflexion  8      Strength   Left Ankle Dorsiflexion  5/5    Left Ankle Plantar Flexion  4/5    Left Ankle Inversion  5/5    Left Ankle Eversion  5/5                   [facility] Adult PT Treatment/Exercise - 11/26/18 0001      Modalities   Modalities  Electrical Stimulation;Moist Heat;Ultrasound      Moist Heat  Therapy   Number Minutes Moist Heat  15 Minutes    Moist Heat Location  Ankle      Electrical Stimulation   Electrical Stimulation Location  L achilles, arch    Electrical Stimulation Action  IFC    Electrical Stimulation Parameters  80-150 hz x15 mins    Electrical Stimulation Goals  Tone;Pain      Ultrasound   Ultrasound Location  left PF    Ultrasound Parameters  COMBO1.5 w/cm2 100% 63mz x12 mins     Ultrasound Goals  Pain      Manual Therapy   Manual Therapy  Soft tissue mobilization    Soft tissue mobilization  STW/M to L arch, achilles, superior gastroc to reduce tightness and pain              PT Education - 11/26/18 1211    Education Details  standing heel raises, toe raises and marching    Person(s) Educated  Patient    Methods  Explanation;Demonstration;Handout    Comprehension  Verbalized understanding          PT Long Term Goals - 11/26/18 1141      PT LONG TERM GOAL #1   Title  Patient will be independent with HEP.    Period  Weeks    Status  Achieved      PT LONG TERM GOAL #2    Title  Patient will demonstrate 5 degrees of left ankle DF AROM to improve gait mechanics.    Time  4    Period  Weeks    Status  Achieved      PT LONG TERM GOAL #3   Title  Patient will demonstrate 5/5 left ankle MMT in all planes to improve stability during functional tasks.     Time  4    Period  Weeks    Status  Achieved      PT LONG TERM GOAL #4   Title  Patient will report ability to perform ADLs with left foot/heel pain as less than 4/10.     Time  4    Period  Weeks    Status  Achieved      PT LONG TERM GOAL #5   Title  Patient will report ability to walk for greater than 20 minutes with left ankle pain less than 4/10.    Time  4    Period  Weeks    Status  Not Met            Plan - 11/26/18 1212    Clinical Impression Statement  Patient arrived to physical therapy with ongoing plantar fascia pain. Patient still has pain with ADLs, walking and standing activities but is able to perform wearing a normal shoe. Patient still has palpable trigger points throughout gastroc and soleus and is unable to tolerate moderate STW/M and trigger point release. Recertification and request for 4 additional visits was sent to referring MD to address ongoing goal. PT emphasized next 4 visits will trasnsition to standing exercises to improve functional tasks at home. Patient reported understanding. No adverse affects noted upon removal of modalities.    Personal Factors and Comorbidities  Age;Comorbidity 3+    Comorbidities  DM, chronic kidney disease stage 3, HTN, congestive heart failure, bipolar disorder    Examination-Activity Limitations  Stairs;Stand;Locomotion Level    Stability/Clinical Decision Making  Stable/Uncomplicated    Clinical Decision Making  Low    Rehab Potential  Good    PT Frequency  2x / week    PT Duration  4 weeks    PT Treatment/Interventions  ADLs/Self Care Home Management;Electrical Stimulation;Iontophoresis 60m/ml Dexamethasone;Moist  Heat;Ultrasound;Cryotherapy;Gait training;Stair training;Functional mobility training;Neuromuscular re-education;Balance training;Therapeutic exercise;Therapeutic activities;Patient/family education;Manual techniques;Dry needling;Passive range of motion;Taping;Vasopneumatic Device    PT Next Visit Plan  transition to TEs, Nustep, heel raises, towel crunches modalities PRN Continue TPR/STW to L calf as symptoms dictate. USE SMALL BOLSTER and requires assist with don/doff boot.    PT Home Exercise Plan  standing heel raises, toe raises, and marching    Consulted and Agree with Plan of Care  Patient       Patient will benefit from skilled therapeutic intervention in order to improve the following deficits and impairments:  Pain, Decreased activity tolerance, Decreased endurance, Decreased range of motion, Decreased strength, Difficulty walking  Visit Diagnosis: 1. Pain in left foot   2. Difficulty in walking, not elsewhere classified        Problem List Patient Active Problem List   Diagnosis Date Noted  . Obesity (BMI 30-39.9) 07/06/2018  . At risk for sleep apnea 07/06/2018  . Closed fracture of proximal end of left humerus 03/16/2018  . Left humeral fracture 03/06/2018  . Fracture of four ribs of left side, closed, initial encounter 03/06/2018  . Mild intermittent asthma without complication 076/54/6503 . Cardiomegaly 01/12/2018  . NAFL (nonalcoholic fatty liver) 054/65/6812 . Age-related osteoporosis without current pathological fracture 12/15/2017  . Asthma 11/12/2017  . Atrial fibrillation with RVR ([place] 05/19/2017  . Microscopic colitis 05/19/2017  . Hypotension 05/19/2017  . Fibromyalgia 03/19/2017  . Healthcare maintenance 02/12/2016  . Migraine headache with aura 02/12/2016  . Diabetic neuropathy ([place] 02/06/2016  . Depression   . Herpes genitalis in women 07/16/2015  . Chronic anticoagulation 05/30/2015  . Family history of coronary artery disease in sister 05/30/2015   . Chronic diastolic CHF (congestive heart failure) ([place] 05/30/2015  . Chest pain with moderate risk of acute coronary syndrome 05/29/2015  . Dyspnea on exertion   . Essential hypertension   . RLS (restless legs syndrome) 04/27/2015  . Lipoma 02/08/2015  . Bipolar disorder ([place] 01/23/2015  . Insomnia 01/23/2015  . Chronic back pain 01/04/2015  . Permanent atrial fibrillation 01/04/2015  . DM2 (diabetes mellitus, type 2) ([place]   . Hyperlipidemia    KGabriela Eves PT, DPT 11/26/2018, 12:31 PM  [facility] [address]Phone: [phone]  Fax:  [phone] Name: Amber GUTMANMRN: [id number] of Birth: 107-11-1945

## 2018-11-26 NOTE — Addendum Note (Signed)
Addended by: Gabriela Eves on: 11/26/2018 12:56 PM   Modules accepted: Orders

## 2018-11-29 ENCOUNTER — Other Ambulatory Visit: Payer: Self-pay

## 2018-11-29 ENCOUNTER — Ambulatory Visit (INDEPENDENT_AMBULATORY_CARE_PROVIDER_SITE_OTHER): Payer: Medicare Other | Admitting: *Deleted

## 2018-11-29 VITALS — Ht 60.0 in | Wt 204.0 lb

## 2018-11-29 DIAGNOSIS — Z0000 Encounter for general adult medical examination without abnormal findings: Secondary | ICD-10-CM

## 2018-11-29 NOTE — Patient Instructions (Signed)
Amber Stephenson , Thank you for taking time to come for your Medicare Wellness Visit. I appreciate your ongoing commitment to your health goals. Please review the following plan we discussed and let me know if I can assist you in the future.   These are the goals we discussed: Goals    . Exercise 3x per week (30 min per time)    . Have 3 meals a day       This is a list of the screening recommended for you and due dates:  Health Maintenance  Topic Date Due  . Tetanus Vaccine  06/02/1966  . Urine Protein Check  07/22/2016  . Complete foot exam   07/22/2018  . Hemoglobin A1C  09/04/2018  . Flu Shot  01/01/2019  . Eye exam for diabetics  11/10/2019  . Mammogram  11/16/2020  . Colon Cancer Screening  04/01/2027  . DEXA scan (bone density measurement)  Completed  .  Hepatitis C: One time screening is recommended by Center for Disease Control  (CDC) for  adults born from [birth year] through 1963.   Completed  . Pneumonia vaccines  Completed        Advance Directive  Advance directives are legal documents that let you make choices ahead of time about your health care and medical treatment in case you become unable to communicate for yourself. Advance directives are a way for you to communicate your wishes to family, friends, and health care providers. This can help convey your decisions about end-of-life care if you become unable to communicate. Discussing and writing advance directives should happen over time rather than all at once. Advance directives can be changed depending on your situation and what you want, even after you have signed the advance directives. If you do not have an advance directive, some states assign family decision makers to act on your behalf based on how closely you are related to them. Each state has its own laws regarding advance directives. You may want to check with your health care provider, attorney, or state representative about the laws in your state. There are  different types of advance directives, such as:  Medical power of attorney.  Living will.  Do not resuscitate (DNR) or do not attempt resuscitation (DNAR) order. Health care proxy and medical power of attorney A health care proxy, also called a health care agent, is a person who is appointed to make medical decisions for you in cases in which you are unable to make the decisions yourself. Generally, people choose someone they know well and trust to represent their preferences. Make sure to ask this person for an agreement to act as your proxy. A proxy may have to exercise judgment in the event of a medical decision for which your wishes are not known. A medical power of attorney is a legal document that names your health care proxy. Depending on the laws in your state, after the document is written, it may also need to be:  Signed.  Notarized.  Dated.  Copied.  Witnessed.  Incorporated into your medical record. You may also want to appoint someone to manage your financial affairs in a situation in which you are unable to do so. This is called a durable power of attorney for finances. It is a separate legal document from the durable power of attorney for health care. You may choose the same person or someone different from your health care proxy to act as your agent in financial matters. If  you do not appoint a proxy, or if there is a concern that the proxy is not acting in your best interests, a court-appointed guardian may be designated to act on your behalf. Living will A living will is a set of instructions documenting your wishes about medical care when you cannot express them yourself. Health care providers should keep a copy of your living will in your medical record. You may want to give a copy to family members or friends. To alert caregivers in case of an emergency, you can place a card in your wallet to let them know that you have a living will and where they can find it. A living  will is used if you become:  Terminally ill.  Incapacitated.  Unable to communicate or make decisions. Items to consider in your living will include:  The use or non-use of life-sustaining equipment, such as dialysis machines and breathing machines (ventilators).  A DNR or DNAR order, which is the instruction not to use cardiopulmonary resuscitation (CPR) if breathing or heartbeat stops.  The use or non-use of tube feeding.  Withholding of food and fluids.  Comfort (palliative) care when the goal becomes comfort rather than a cure.  Organ and tissue donation. A living will does not give instructions for distributing your money and property if you should pass away. It is recommended that you seek the advice of a lawyer when writing a will. Decisions about taxes, beneficiaries, and asset distribution will be legally binding. This process can relieve your family and friends of any concerns surrounding disputes or questions that may come up about the distribution of your assets. DNR or DNAR A DNR or DNAR order is a request not to have CPR in the event that your heart stops beating or you stop breathing. If a DNR or DNAR order has not been made and shared, a health care provider will try to help any patient whose heart has stopped or who has stopped breathing. If you plan to have surgery, talk with your health care provider about how your DNR or DNAR order will be followed if problems occur. Summary  Advance directives are the legal documents that allow you to make choices ahead of time about your health care and medical treatment in case you become unable to communicate for yourself.  The process of discussing and writing advance directives should happen over time. You can change the advance directives, even after you have signed them.  Advance directives include DNR or DNAR orders, living wills, and designating an agent as your medical power of attorney. This information is not intended to  replace advice given to you by your health care provider. Make sure you discuss any questions you have with your health care provider. Document Released: 08/26/2007 Document Revised: 06/23/2018 Document Reviewed: 04/07/2016 Elsevier Patient Education  [place].       BMI for Adults  Body mass index (BMI) is a number that is calculated from a person's weight and height. BMI may help to estimate how much of a person's weight is composed of fat. BMI can help identify those who may be at higher risk for certain medical problems. How is BMI used with adults? BMI is used as a screening tool to identify possible weight problems. It is used to check whether a person is obese, overweight, healthy weight, or underweight. How is BMI calculated? BMI measures your weight and compares it to your height. This can be done either in [place] ([place] or  metric measurements. Note that charts are available to help you find your BMI quickly and easily without having to do these calculations yourself. To calculate your BMI in English (U[place] measurements, your health care provider will: 1. Measure your weight in pounds (lb). 2. Multiply the number of pounds by 703. ? For example, for a person who weighs 180 lb, multiply that number by 703, which equals 126,540. 3. Measure your height in inches (in). Then multiply that number by itself to get a measurement called "inches squared." ? For example, for a person who is 70 in tall, the "inches squared" measurement is 70 in x 70 in, which equals 4900 inches squared. 4. Divide the total from Step 2 (number of lb x 703) by the total from Step 3 (inches squared): 126,540  4900 = 25.8. This is your BMI. To calculate your BMI in metric measurements, your health care provider will: 1. Measure your weight in kilograms (kg). 2. Measure your height in meters (m). Then multiply that number by itself to get a measurement called "meters squared." ? For example, for a  person who is 1.75 m tall, the "meters squared" measurement is 1.75 m x 1.75 m, which is equal to 3.1 meters squared. 3. Divide the number of kilograms (your weight) by the meters squared number. In this example: 70  3.1 = 22.6. This is your BMI. How is BMI interpreted? To interpret your results, your health care provider will use BMI charts to identify whether you are underweight, normal weight, overweight, or obese. The following guidelines will be used:  Underweight: BMI less than 18.5.  Normal weight: BMI between 18.5 and 24.9.  Overweight: BMI between 25 and 29.9.  Obese: BMI of 30 and above. Please note:  Weight includes both fat and muscle, so someone with a muscular build, such as an athlete, may have a BMI that is higher than 24.9. In cases like these, BMI is not an accurate measure of body fat.  To determine if excess body fat is the cause of a BMI of 25 or higher, further assessments may need to be done by a health care provider.  BMI is usually interpreted in the same way for men and women. Why is BMI a useful tool? BMI is useful in two ways:  Identifying a weight problem that may be related to a medical condition, or that may increase the risk for medical problems.  Promoting lifestyle and diet changes in order to reach a healthy weight. Summary  Body mass index (BMI) is a number that is calculated from a person's weight and height.  BMI may help to estimate how much of a person's weight is composed of fat. BMI can help identify those who may be at higher risk for certain medical problems.  BMI can be measured using English measurements or metric measurements.  To interpret your results, your health care provider will use BMI charts to identify whether you are underweight, normal weight, overweight, or obese. This information is not intended to replace advice given to you by your health care provider. Make sure you discuss any questions you have with your health care  provider. Document Released: 01/29/2004 Document Revised: 05/01/2017 Document Reviewed: 04/01/2017 Elsevier Patient Education  2020 [employer].

## 2018-11-29 NOTE — Progress Notes (Signed)
   MEDICARE ANNUAL WELLNESS VISIT  11/29/2018  Telephone Visit Disclaimer This Medicare AWV was conducted by telephone due to national recommendations for restrictions regarding the COVID-19 Pandemic (e.g. social distancing).  I verified, using two identifiers, that I am speaking with Amber Stephenson or their authorized healthcare agent. I discussed the limitations, risks, security, and privacy concerns of performing an evaluation and management service by telephone and the potential availability of an in-person appointment in the future. The patient expressed understanding and agreed to proceed.   Subjective:  Amber Stephenson is a 73 y.o. female patient of Gottschalk, Ashly M, DO who had a Medicare Annual Wellness Visit today via telephone. Amber Stephenson is Retired and lives with their spouse. she has 3 children. she reports that she is socially active and does interact with friends/family regularly. she is not physically active and enjoys reading.  Patient Care Team: Gottschalk, Ashly M, DO as PCP - General (Family Medicine) Koneswaran, Suresh A, MD as PCP - Cardiology (Cardiology) Norris, James P, MD as Referring Physician (Orthopedic Surgery) Plovsky, Gerald, MD as Consulting Physician (Psychiatry) Lambeth, John, MD (Endocrinology)  Advanced Directives 11/29/2018 03/06/2018 03/05/2018 02/03/2018 09/22/2017 08/10/2017 05/28/2017  Does Patient Have a Medical Advance Directive? No No No No No No No  Would patient like information on creating a medical advance directive? Yes (MAU/Ambulatory/Procedural Areas - Information given) No - Patient declined - No - Patient declined Yes (MAU/Ambulatory/Procedural Areas - Information given) - -  Some encounter information is confidential and restricted. Go to Review Flowsheets activity to see all data.    Hospital Utilization Over the Past 12 Months: # of hospitalizations or ER visits: 0 # of surgeries: 0  Review of Systems    Patient reports that her  overall health is worse compared to last year.  Patient Reported Readings (BP, Pulse, CBG, Weight, etc) CBG 199  Review of Systems:   All other systems negative.  Pain Assessment Pain : 0-10 Pain Score: 8  Pain Location: Back Pain Orientation: Lower Pain Radiating Towards: Shoulder Pain Descriptors / Indicators: Aching, Constant Pain Onset: More than a month ago Pain Frequency: Constant Pain Relieving Factors: Percocet  Pain Relieving Factors: Percocet  Current Medications & Allergies (verified) Allergies as of 11/29/2018      Reactions   Ivp Dye [iodinated Diagnostic Agents] Anaphylaxis, Swelling   Throat closes   Latex    Pulls skin with it      Medication List       Accurate as of November 29, 2018  3:19 PM. If you have any questions, ask your nurse or doctor.        albuterol (2.5 MG/3ML) 0.083% nebulizer solution Commonly known as: PROVENTIL Take 3 mLs (2.5 mg total) by nebulization every 6 (six) hours as needed for wheezing or shortness of breath.   albuterol 108 (90 Base) MCG/ACT inhaler Commonly known as: VENTOLIN HFA Inhale 2 puffs into the lungs every 6 (six) hours as needed for wheezing or shortness of breath.   ARIPiprazole 10 MG tablet Commonly known as: ABILIFY Take 3 tablets (30 mg total) by mouth daily.   atorvastatin 40 MG tablet Commonly known as: LIPITOR TAKE 1 TABLET DAILY   budesonide 3 MG 24 hr capsule Commonly known as: ENTOCORT EC Take 9 mg by mouth every morning.   budesonide-formoterol 80-4.5 MCG/ACT inhaler Commonly known as: Symbicort Inhale 2 puffs into the lungs 2 (two) times daily.   carbamazepine 100 MG 12 hr capsule Commonly known as: CARBATROL   Take 1 capsule (100 mg total) by mouth daily.   diltiazem 120 MG 24 hr capsule Commonly known as: CARDIZEM CD TAKE 1 CAPSULE AT BEDTIME   Dulaglutide 1.5 MG/0.5ML Sopn Commonly known as: Trulicity Inject 1.5 mg into the skin once a week.   Eliquis 5 MG Tabs tablet Generic  drug: apixaban TAKE 1 TABLET TWICE A DAY (NEED TO BE SEEN BEFORE NEXT REFILL)   Eszopiclone 3 MG Tabs Commonly known as: eszopiclone Take 1 tablet (3 mg total) by mouth at bedtime as needed. Take immediately before bedtime   freestyle lancets Test tid. DX E11.22   FreeStyle Lite Devi Use to check blood sugar tid. DX E11.22   furosemide 20 MG tablet Commonly known as: LASIX Take 1 tablet (20 mg total) by mouth 2 (two) times daily.   glucose blood test strip Commonly known as: FREESTYLE LITE USE FOR TESTING SUGAR THREE TIMES DAILY   Insulin Glargine 100 UNIT/ML Solostar Pen Commonly known as: Lantus SoloStar INJECT 60 UNITS UNDER THE SKIN TWICE A DAY What changed:   how much to take  how to take this  when to take this  additional instructions   Insulin Pen Needle 32G X 4 MM Misc Commonly known as: Sure Comfort Pen Needles Use with insulin pens as directed. Dx E11.9   LORazepam 1 MG tablet Commonly known as: Ativan Take 1 tab (1 mg) in the morning and 2 tabs (2 mg ) at bedtime   metoprolol tartrate 25 MG tablet Commonly known as: LOPRESSOR Take 1 tablet (25 mg total) by mouth 2 (two) times daily. Take with metoprolol 100 mg to equal 125 mg   metoprolol tartrate 100 MG tablet Commonly known as: LOPRESSOR TAKE 1 TABLET TWICE A DAY WITH METOPROLOL 25 MG TO EQUAL 125 MG   Narcan 4 MG/0.1ML Liqd nasal spray kit Generic drug: naloxone Place 1 spray into the nose once as needed (opiod overdose).   nystatin powder Commonly known as: MYCOSTATIN/NYSTOP Apply topically 3 (three) times daily. x2 weeks (for groin/breast rash)   pregabalin 150 MG capsule Commonly known as: Lyrica Take 1 capsule (150 mg total) by mouth 2 (two) times daily.   rizatriptan 10 MG disintegrating tablet Commonly known as: MAXALT-MLT DISSOLVE 1 TABLET ON THE TONGUE AT FIRST SIGN OF HEADACHE. MAY REPEAT ONE DOSE IN 1 HOUR IF NEEDED   tizanidine 2 MG capsule Commonly known as: ZANAFLEX Take  1 capsule (2 mg total) by mouth 3 (three) times daily as needed for muscle spasms.   topiramate 25 MG tablet Commonly known as: TOPAMAX 1 tablet in the morning and 3 tablets every night   traZODone 100 MG tablet Commonly known as: DESYREL 4  qhs   Vascepa 1 g Caps Generic drug: Icosapent Ethyl Take 2 capsules (2 g total) by mouth 2 (two) times daily with a meal.   Vitamin D3 25 MCG (1000 UT) Caps Take 1,000 Units by mouth daily.       History (reviewed): Past Medical History:  Diagnosis Date  . Atrial fibrillation ([place])   . Bipolar affective ([place])   . CHF (congestive heart failure) ([place])   . Chronic back pain   . Diabetes mellitus without complication ([place])   . Hyperlipidemia   . Hypertension   . Morbid obesity due to excess calories ([place]) 07/06/2018  . Pain management   . Renal insufficiency    Past Surgical History:  Procedure Laterality Date  . BREAST REDUCTION SURGERY    . EYE SURGERY  Right    cateracts  . HAMMER TOE SURGERY    . LEFT HEART CATH AND CORONARY ANGIOGRAPHY N/A 02/03/2018   Procedure: LEFT HEART CATH AND CORONARY ANGIOGRAPHY;  Surgeon: Jordan, Peter M, MD;  Location: [facility];  Service: Cardiovascular;  Laterality: N/A;  . SHOULDER SURGERY Right   . THIGH SURGERY     Family History  Problem Relation Age of Onset  . Diabetes Mother   . Heart disease Mother   . Heart disease Brother   . Heart disease Sister        CABG  . Alcohol abuse Sister   . Mental illness Brother   . Diabetes Brother    Social History   Socioeconomic History  . Marital status: Married    Spouse name: Dwight  . Number of children: 3  . Years of education: 16  . Highest education level: Bachelor's degree (e.g., BA, AB, BS)  Occupational History  . Occupation: Retired  Social Needs  . Financial resource strain: Not hard at all  . Food insecurity    Worry: Never true    Inability: Never true  . Transportation needs    Medical: No    Non-medical: No   Tobacco Use  . Smoking status: Never Smoker  . Smokeless tobacco: Never Used  Substance and Sexual Activity  . Alcohol use: No    Alcohol/week: 0.0 standard drinks  . Drug use: No  . Sexual activity: Yes    Partners: Male    Birth control/protection: Post-menopausal  Lifestyle  . Physical activity    Days per week: 0 days    Minutes per session: 0 min  . Stress: Not at all  Relationships  . Social connections    Talks on phone: More than three times a week    Gets together: Once a week    Attends religious service: More than 4 times per year    Active member of club or organization: No    Attends meetings of clubs or organizations: Never    Relationship status: Married  Other Topics Concern  . Not on file  Social History Narrative   Lives at home with husband.     Activities of Daily Living In your present state of health, do you have any difficulty performing the following activities: 11/29/2018 03/07/2018  Hearing? N -  Vision? Y -  Difficulty concentrating or making decisions? N -  Walking or climbing stairs? Y -  Dressing or bathing? Y -  Doing errands, shopping? Y Y  Preparing Food and eating ? N -  Using the Toilet? N -  In the past six months, have you accidently leaked urine? N -  Do you have problems with loss of bowel control? N -  Managing your Medications? N -  Managing your Finances? N -  Housekeeping or managing your Housekeeping? N -  Some recent data might be hidden    Patient Literacy How often do you need to have someone help you when you read instructions, pamphlets, or other written materials from your doctor or pharmacy?: 1 - Never What is the last grade level you completed in school?: Bachelor's Degree  Exercise Current Exercise Habits: The patient does not participate in regular exercise at present, Exercise limited by: orthopedic condition(s)  Diet Patient reports consuming 2 meals a day and 2 snack(s) a day Patient reports that her  primary diet is: Regular Patient reports that she does have regular access to food.     Depression Screen PHQ 2/9 Scores 11/29/2018 10/26/2018 08/03/2018 07/20/2018 07/07/2018 06/24/2018 06/09/2018  PHQ - 2 Score 0 0 0 0 0 0 0  PHQ- 9 Score - 0 0 0 - - 3  Exception Documentation - - - - - - -     Fall Risk Fall Risk  11/29/2018 10/26/2018 08/06/2018 08/03/2018 07/20/2018  Falls in the past year? 1 1 1 1 1  Number falls in past yr: 1 1 0 1 1  Injury with Fall? 1 1 1 1 1  Comment - - - - -  Risk for fall due to : History of fall(s);Impaired mobility;Impaired balance/gait - - - -  Follow up Falls prevention discussed - - - -     Objective:  Amber Stephenson seemed alert and oriented and she participated appropriately during our telephone visit.  Blood Pressure Weight BMI  BP Readings from Last 3 Encounters:  10/26/18 139/81  08/27/18 99/70  08/06/18 132/78   Wt Readings from Last 3 Encounters:  11/29/18 204 lb (92.5 kg)  08/27/18 204 lb (92.5 kg)  08/06/18 208 lb (94.3 kg)   BMI Readings from Last 1 Encounters:  11/29/18 39.84 kg/m    *Unable to obtain current vital signs, weight, and BMI due to telephone visit type  Hearing/Vision  . Amber Stephenson did  seem to have difficulty with hearing/understanding during the telephone conversation . Reports that she has not had a formal eye exam by an eye care professional within the past year . Reports that she has not had a formal hearing evaluation within the past year *Unable to fully assess hearing and vision during telephone visit type  Cognitive Function: 6CIT Screen 11/29/2018  What Year? 0 points  What month? 0 points  What time? 0 points  Count back from 20 4 points  Months in reverse 4 points  Repeat phrase 2 points  Total Score 10   (Normal:0-7, Significant for Dysfunction: >8)  Normal Cognitive Function Screening: NO   Immunization & Health Maintenance Record Immunization History  Administered Date(s) Administered  . Influenza,  High Dose Seasonal PF 03/19/2017, 02/17/2018  . Influenza,inj,Quad PF,6+ Mos 03/30/2015, 03/17/2016  . Pneumococcal Conjugate-13 11/12/2017  . Pneumococcal Polysaccharide-23 05/30/2015  . Zoster 06/02/2012    Health Maintenance  Topic Date Due  . TETANUS/TDAP  06/02/1966  . URINE MICROALBUMIN  07/22/2016  . FOOT EXAM  07/22/2018  . HEMOGLOBIN A1C  09/04/2018  . INFLUENZA VACCINE  01/01/2019  . OPHTHALMOLOGY EXAM  11/10/2019  . MAMMOGRAM  11/16/2020  . COLONOSCOPY  04/01/2027  . DEXA SCAN  Completed  . Hepatitis C Screening  Completed  . PNA vac Low Risk Adult  Completed       Assessment  This is a routine wellness examination for Amber Stephenson.  Health Maintenance: Due or Overdue Health Maintenance Due  Topic Date Due  . TETANUS/TDAP  06/02/1966  . URINE MICROALBUMIN  07/22/2016  . FOOT EXAM  07/22/2018  . HEMOGLOBIN A1C  09/04/2018    Amber Stephenson does not need a referral for Community Assistance: Care Management:   no Social Work:    no Prescription Assistance:  no Nutrition/Diabetes Education:  no   Plan:  Personalized Goals Goals Addressed   None    Personalized Health Maintenance & Screening Recommendations  Td vaccine Diabetes screening Glaucoma screening Advanced directives: has NO advanced directive  - add't info requested. Referral to SW: no Shingrix  Lung Cancer Screening Recommended: no (Low Dose CT Chest recommended   if Age 55-80 years, 30 pack-year currently smoking OR have quit w/in past 15 years) Hepatitis C Screening recommended: no HIV Screening recommended: no  Advanced Directives: Written information was prepared per patient's request.  Referrals & Orders No orders of the defined types were placed in this encounter.   Follow-up Plan . Follow-up with Gottschalk, Ashly M, DO as planned    I have personally reviewed and noted the following in the patient's chart:   . Medical and social history . Use of alcohol, tobacco or  illicit drugs  . Current medications and supplements . Functional ability and status . Nutritional status . Physical activity . Advanced directives . List of other physicians . Hospitalizations, surgeries, and ER visits in previous 12 months . Vitals . Screenings to include cognitive, depression, and falls . Referrals and appointments  In addition, I have reviewed and discussed with Amber Stephenson certain preventive protocols, quality metrics, and best practice recommendations. A written personalized care plan for preventive services as well as general preventive health recommendations is available and can be mailed to the patient at her request.       M , LPN  11/29/2018      

## 2018-11-30 DIAGNOSIS — M25511 Pain in right shoulder: Secondary | ICD-10-CM | POA: Diagnosis not present

## 2018-11-30 DIAGNOSIS — M19011 Primary osteoarthritis, right shoulder: Secondary | ICD-10-CM | POA: Diagnosis not present

## 2018-12-01 DIAGNOSIS — E1165 Type 2 diabetes mellitus with hyperglycemia: Secondary | ICD-10-CM | POA: Diagnosis not present

## 2018-12-01 DIAGNOSIS — E7800 Pure hypercholesterolemia, unspecified: Secondary | ICD-10-CM | POA: Diagnosis not present

## 2018-12-01 DIAGNOSIS — E559 Vitamin D deficiency, unspecified: Secondary | ICD-10-CM | POA: Diagnosis not present

## 2018-12-01 DIAGNOSIS — M545 Low back pain: Secondary | ICD-10-CM | POA: Diagnosis not present

## 2018-12-01 DIAGNOSIS — Z1159 Encounter for screening for other viral diseases: Secondary | ICD-10-CM | POA: Diagnosis not present

## 2018-12-01 DIAGNOSIS — M129 Arthropathy, unspecified: Secondary | ICD-10-CM | POA: Diagnosis not present

## 2018-12-01 DIAGNOSIS — D539 Nutritional anemia, unspecified: Secondary | ICD-10-CM | POA: Diagnosis not present

## 2018-12-01 DIAGNOSIS — G629 Polyneuropathy, unspecified: Secondary | ICD-10-CM | POA: Diagnosis not present

## 2018-12-01 DIAGNOSIS — Z79899 Other long term (current) drug therapy: Secondary | ICD-10-CM | POA: Diagnosis not present

## 2018-12-01 DIAGNOSIS — R300 Dysuria: Secondary | ICD-10-CM | POA: Diagnosis not present

## 2018-12-01 DIAGNOSIS — M5136 Other intervertebral disc degeneration, lumbar region: Secondary | ICD-10-CM | POA: Diagnosis not present

## 2018-12-06 ENCOUNTER — Other Ambulatory Visit: Payer: Self-pay | Admitting: Family Medicine

## 2018-12-06 NOTE — Telephone Encounter (Signed)
Next OV 12/08/2018

## 2018-12-07 ENCOUNTER — Other Ambulatory Visit: Payer: Self-pay

## 2018-12-07 DIAGNOSIS — M79672 Pain in left foot: Secondary | ICD-10-CM | POA: Diagnosis not present

## 2018-12-07 DIAGNOSIS — M722 Plantar fascial fibromatosis: Secondary | ICD-10-CM | POA: Diagnosis not present

## 2018-12-08 ENCOUNTER — Ambulatory Visit: Payer: Medicare Other | Admitting: Family Medicine

## 2018-12-09 ENCOUNTER — Other Ambulatory Visit: Payer: Self-pay

## 2018-12-09 ENCOUNTER — Ambulatory Visit: Payer: Medicare Other | Attending: Podiatry | Admitting: *Deleted

## 2018-12-09 DIAGNOSIS — R262 Difficulty in walking, not elsewhere classified: Secondary | ICD-10-CM | POA: Diagnosis not present

## 2018-12-09 DIAGNOSIS — M79672 Pain in left foot: Secondary | ICD-10-CM | POA: Insufficient documentation

## 2018-12-09 DIAGNOSIS — R0609 Other forms of dyspnea: Secondary | ICD-10-CM | POA: Diagnosis not present

## 2018-12-09 DIAGNOSIS — R0600 Dyspnea, unspecified: Secondary | ICD-10-CM

## 2018-12-09 DIAGNOSIS — R2689 Other abnormalities of gait and mobility: Secondary | ICD-10-CM | POA: Diagnosis not present

## 2018-12-09 NOTE — Therapy (Signed)
[facility] [place], [place], [address] Phone: [phone]   Fax:  [phone]  Physical Therapy Treatment  Patient Details  Name: Amber Stephenson MRN: [id number] Date of Birth: 22-Dec-1945 Referring Provider (PT): Steffanie Rainwater, MD   Encounter Date: 12/09/2018  PT End of Session - 12/09/18 1732    Visit Number  9    Number of Visits  12    Date for PT Re-Evaluation  12/31/18    Authorization Type  Progress note every 10th visit; KX modifier after 15th visit    PT Start Time  1430    PT Stop Time  1516    PT Time Calculation (min)  46 min       Past Medical History:  Diagnosis Date  . Atrial fibrillation ([place])   . Bipolar affective ([place])   . CHF (congestive heart failure) ([place])   . Chronic back pain   . Diabetes mellitus without complication ([place])   . Hyperlipidemia   . Hypertension   . Morbid obesity due to excess calories ([place]) 07/06/2018  . Pain management   . Renal insufficiency     Past Surgical History:  Procedure Laterality Date  . BREAST REDUCTION SURGERY    . EYE SURGERY Right    cateracts  . HAMMER TOE SURGERY    . LEFT HEART CATH AND CORONARY ANGIOGRAPHY N/A 02/03/2018   Procedure: LEFT HEART CATH AND CORONARY ANGIOGRAPHY;  Surgeon: Martinique, Peter M, MD;  Location: [facility];  Service: Cardiovascular;  Laterality: N/A;  . SHOULDER SURGERY Right   . THIGH SURGERY      There were no vitals filed for this visit.  Subjective Assessment - 12/09/18 1436    Subjective  COVID 19 screening performed on patient upon arrival. Had an injection in my heel on Tuesday.    Pertinent History  DM, chronic kidney disease stage 3, HTN, congestive heart failure, bipolar disorder    Limitations  Standing;Walking;House hold activities    How long can you sit comfortably?  unlimited    How long can you stand comfortably?  10-20 minutes    How long can you walk comfortably?  20 minutes; 30 minutes 11/26/2018    Currently in Pain?  Yes    Pain Score  5     Pain Location  Heel    Pain Onset  More than a month ago    Pain Frequency  Constant                       [facility] Adult PT Treatment/Exercise - 12/09/18 0001      Exercises   Exercises  Knee/Hip      Knee/Hip Exercises: Aerobic   Nustep  L2 x 10 mins, LEs only      Modalities   Modalities  Electrical Stimulation;Moist Heat;Ultrasound      Moist Heat Therapy   Number Minutes Moist Heat  13 Minutes    Moist Heat Location  Ankle      Electrical Stimulation   Electrical Stimulation Location  L achilles, arch    Electrical Stimulation Action  IFC    Electrical Stimulation Parameters  80-150hz  x 13 mins    Electrical Stimulation Goals  Tone;Pain      Manual Therapy   Manual Therapy  Soft tissue mobilization    Soft tissue mobilization  STW/M to L arch, heel, and achilles, reduce tightness and pain  PT Long Term Goals - 11/26/18 1141      PT LONG TERM GOAL #1   Title  Patient will be independent with HEP.    Period  Weeks    Status  Achieved      PT LONG TERM GOAL #2   Title  Patient will demonstrate 5 degrees of left ankle DF AROM to improve gait mechanics.    Time  4    Period  Weeks    Status  Achieved      PT LONG TERM GOAL #3   Title  Patient will demonstrate 5/5 left ankle MMT in all planes to improve stability during functional tasks.     Time  4    Period  Weeks    Status  Achieved      PT LONG TERM GOAL #4   Title  Patient will report ability to perform ADLs with left foot/heel pain as less than 4/10.     Time  4    Period  Weeks    Status  Achieved      PT LONG TERM GOAL #5   Title  Patient will report ability to walk for greater than 20 minutes with left ankle pain less than 4/10.    Time  4    Period  Weeks    Status  Not Met            Plan - 12/09/18 1737    Clinical Impression Statement  Pt arrived today reporting that she had an injection earlier  this week and her pain is less today. She was able to tolerate 10 mins of exercise today and reports that she feels that it helped decrease the tightness in her LT foot. She did well with STW and reports most of her soreness lateral aspect of LT heel. Normal modality response today    Personal Factors and Comorbidities  Age;Comorbidity 3+    Comorbidities  DM, chronic kidney disease stage 3, HTN, congestive heart failure, bipolar disorder    Examination-Activity Limitations  Stairs;Stand;Locomotion Level    Stability/Clinical Decision Making  Stable/Uncomplicated    Rehab Potential  Good    PT Frequency  2x / week    PT Duration  4 weeks    PT Treatment/Interventions  ADLs/Self Care Home Management;Electrical Stimulation;Iontophoresis 27m/ml Dexamethasone;Moist Heat;Ultrasound;Cryotherapy;Gait training;Stair training;Functional mobility training;Neuromuscular re-education;Balance training;Therapeutic exercise;Therapeutic activities;Patient/family education;Manual techniques;Dry needling;Passive range of motion;Taping;Vasopneumatic Device    PT Next Visit Plan  transition to TEs, Nustep, heel raises, towel crunches modalities PRN Continue TPR/STW to L calf as symptoms dictate. USE SMALL BOLSTER and requires assist with don/doff boot.    PT Home Exercise Plan  standing heel raises, toe raises, and marching    Consulted and Agree with Plan of Care  Patient       Patient will benefit from skilled therapeutic intervention in order to improve the following deficits and impairments:  Pain, Decreased activity tolerance, Decreased endurance, Decreased range of motion, Decreased strength, Difficulty walking  Visit Diagnosis: 1. Pain in left foot   2. Difficulty in walking, not elsewhere classified   3. Other abnormalities of gait and mobility   4. Dyspnea on exertion        Problem List Patient Active Problem List   Diagnosis Date Noted  . Obesity (BMI 30-39.9) 07/06/2018  . At risk for sleep  apnea 07/06/2018  . Closed fracture of proximal end of left humerus 03/16/2018  . Left humeral fracture 03/06/2018  . Fracture of  four ribs of left side, closed, initial encounter 03/06/2018  . Mild intermittent asthma without complication 67/73/7366  . Cardiomegaly 01/12/2018  . NAFL (nonalcoholic fatty liver) 81/59/4707  . Age-related osteoporosis without current pathological fracture 12/15/2017  . Asthma 11/12/2017  . Atrial fibrillation with RVR ([place]) 05/19/2017  . Microscopic colitis 05/19/2017  . Hypotension 05/19/2017  . Fibromyalgia 03/19/2017  . Healthcare maintenance 02/12/2016  . Migraine headache with aura 02/12/2016  . Diabetic neuropathy ([place]) 02/06/2016  . Depression   . Herpes genitalis in women 07/16/2015  . Chronic anticoagulation 05/30/2015  . Family history of coronary artery disease in sister 05/30/2015  . Chronic diastolic CHF (congestive heart failure) ([place]) 05/30/2015  . Chest pain with moderate risk of acute coronary syndrome 05/29/2015  . Dyspnea on exertion   . Essential hypertension   . RLS (restless legs syndrome) 04/27/2015  . Lipoma 02/08/2015  . Bipolar disorder ([place]) 01/23/2015  . Insomnia 01/23/2015  . Chronic back pain 01/04/2015  . Permanent atrial fibrillation 01/04/2015  . DM2 (diabetes mellitus, type 2) ([place])   . Hyperlipidemia     Trooper Olander,CHRIS, PTA 12/09/2018, 5:44 PM  [facility] [address] [place], [place], [address] Phone: [phone]   Fax:  [phone]  Name: Amber Stephenson MRN: [id number] Date of Birth: [birth year]

## 2018-12-14 ENCOUNTER — Encounter: Payer: Self-pay | Admitting: Physical Therapy

## 2018-12-14 ENCOUNTER — Ambulatory Visit: Payer: Medicare Other | Admitting: Physical Therapy

## 2018-12-14 ENCOUNTER — Other Ambulatory Visit: Payer: Self-pay

## 2018-12-14 DIAGNOSIS — M79672 Pain in left foot: Secondary | ICD-10-CM

## 2018-12-14 DIAGNOSIS — R262 Difficulty in walking, not elsewhere classified: Secondary | ICD-10-CM | POA: Diagnosis not present

## 2018-12-14 DIAGNOSIS — R0609 Other forms of dyspnea: Secondary | ICD-10-CM | POA: Diagnosis not present

## 2018-12-14 DIAGNOSIS — R2689 Other abnormalities of gait and mobility: Secondary | ICD-10-CM | POA: Diagnosis not present

## 2018-12-14 NOTE — Therapy (Signed)
[facility] [place], [place], [address] Phone: [phone]   Fax:  [phone]  Physical Therapy Treatment  Patient Details  Name: Amber Stephenson MRN: [id number] Date of Birth: 1945-12-01 Referring Provider (PT): Steffanie Rainwater, MD   Encounter Date: 12/14/2018    Past Medical History:  Diagnosis Date  . Atrial fibrillation ([place])   . Bipolar affective ([place])   . CHF (congestive heart failure) ([place])   . Chronic back pain   . Diabetes mellitus without complication ([place])   . Hyperlipidemia   . Hypertension   . Morbid obesity due to excess calories ([place]) 07/06/2018  . Pain management   . Renal insufficiency     Past Surgical History:  Procedure Laterality Date  . BREAST REDUCTION SURGERY    . EYE SURGERY Right    cateracts  . HAMMER TOE SURGERY    . LEFT HEART CATH AND CORONARY ANGIOGRAPHY N/A 02/03/2018   Procedure: LEFT HEART CATH AND CORONARY ANGIOGRAPHY;  Surgeon: Martinique, Peter M, MD;  Location: [facility];  Service: Cardiovascular;  Laterality: N/A;  . SHOULDER SURGERY Right   . THIGH SURGERY      There were no vitals filed for this visit.  Subjective Assessment - 12/14/18 1429    Subjective  COVID 19 screening performed on patient upon arrival. Reports some improvement following injection but feels as if it is wearing off now. Reports that she has not had an x ray of her L foot by MD in 6 months. Reports that pain after standing from sitting is 10/10 but able to stand for longer period of time with less pain.    Pertinent History  DM, chronic kidney disease stage 3, HTN, congestive heart failure, bipolar disorder    Limitations  Standing;Walking;House hold activities    How long can you sit comfortably?  unlimited    How long can you stand comfortably?  10-20 minutes    How long can you walk comfortably?  20 minutes; 30 minutes 11/26/2018    Diagnostic tests  x-ray: normal    Patient Stated Goals  get back  to normal    Currently in Pain?  Yes    Pain Score  5     Pain Location  Foot    Pain Orientation  Left    Pain Descriptors / Indicators  Discomfort    Pain Type  Chronic pain    Pain Onset  More than a month ago    Pain Frequency  Constant         [facility] PT Assessment - 12/14/18 0001      Assessment   Medical Diagnosis  left plantar fasciitis    Referring Provider (PT)  Steffanie Rainwater, MD    Onset Date/Surgical Date  05/02/18    Next MD Visit  "3 weeks"    Prior Therapy  no      Precautions   Precautions  None      Restrictions   Weight Bearing Restrictions  No                   [facility] Adult PT Treatment/Exercise - 12/14/18 0001      Modalities   Modalities  Electrical Stimulation;Moist Heat      Moist Heat Therapy   Number Minutes Moist Heat  15 Minutes    Moist Heat Location  Ankle      Electrical Stimulation   Electrical Stimulation Location  L heel, medial ankle  Electrical Stimulation Action  Pre-Mod    Electrical Stimulation Parameters  80-150 hz x15 min    Electrical Stimulation Goals  Pain      Manual Therapy   Manual Therapy  Soft tissue mobilization    Soft tissue mobilization  STW/M to L arch, heel to reduce tightness and pain       Ankle Exercises: Aerobic   Nustep  L4 x10 min LEs only      Ankle Exercises: Seated   Heel Raises  Both;20 reps    Other Seated Ankle Exercises  Rockerboard x2 min                  PT Long Term Goals - 11/26/18 1141      PT LONG TERM GOAL #1   Title  Patient will be independent with HEP.    Period  Weeks    Status  Achieved      PT LONG TERM GOAL #2   Title  Patient will demonstrate 5 degrees of left ankle DF AROM to improve gait mechanics.    Time  4    Period  Weeks    Status  Achieved      PT LONG TERM GOAL #3   Title  Patient will demonstrate 5/5 left ankle MMT in all planes to improve stability during functional tasks.     Time  4    Period  Weeks    Status  Achieved      PT  LONG TERM GOAL #4   Title  Patient will report ability to perform ADLs with left foot/heel pain as less than 4/10.     Time  4    Period  Weeks    Status  Achieved      PT LONG TERM GOAL #5   Title  Patient will report ability to walk for greater than 20 minutes with left ankle pain less than 4/10.    Time  4    Period  Weeks    Status  Not Met            Plan - 12/14/18 1543    Clinical Impression Statement  Patient presented in clinic with continued pain initially upon transition. Patient able to stand for longer periods of time but still having  10/10 L heel pain after standing. Protuberance felt of patient's L heel at approximate region of MCT and patient is especially tender to palpation of the protuberance. Normal modalities response noted following removal of the modalities.    Personal Factors and Comorbidities  Age;Comorbidity 3+    Comorbidities  DM, chronic kidney disease stage 3, HTN, congestive heart failure, bipolar disorder    Examination-Activity Limitations  Stairs;Stand;Locomotion Level    Stability/Clinical Decision Making  Stable/Uncomplicated    Rehab Potential  Good    PT Frequency  2x / week    PT Duration  4 weeks    PT Treatment/Interventions  ADLs/Self Care Home Management;Electrical Stimulation;Iontophoresis 43m/ml Dexamethasone;Moist Heat;Ultrasound;Cryotherapy;Gait training;Stair training;Functional mobility training;Neuromuscular re-education;Balance training;Therapeutic exercise;Therapeutic activities;Patient/family education;Manual techniques;Dry needling;Passive range of motion;Taping;Vasopneumatic Device    PT Next Visit Plan  transition to TEs, Nustep, heel raises, towel crunches modalities PRN Continue TPR/STW to L calf as symptoms dictate. USE SMALL BOLSTER and requires assist with don/doff boot.    PT Home Exercise Plan  standing heel raises, toe raises, and marching    Consulted and Agree with Plan of Care  Patient       Patient will benefit  from skilled therapeutic intervention in order to improve the following deficits and impairments:  Pain, Decreased activity tolerance, Decreased endurance, Decreased range of motion, Decreased strength, Difficulty walking  Visit Diagnosis: 1. Difficulty in walking, not elsewhere classified   2. Pain in left foot        Problem List Patient Active Problem List   Diagnosis Date Noted  . Obesity (BMI 30-39.9) 07/06/2018  . At risk for sleep apnea 07/06/2018  . Closed fracture of proximal end of left humerus 03/16/2018  . Left humeral fracture 03/06/2018  . Fracture of four ribs of left side, closed, initial encounter 03/06/2018  . Mild intermittent asthma without complication 42/39/5320  . Cardiomegaly 01/12/2018  . NAFL (nonalcoholic fatty liver) 23/34/3568  . Age-related osteoporosis without current pathological fracture 12/15/2017  . Asthma 11/12/2017  . Atrial fibrillation with RVR ([place]) 05/19/2017  . Microscopic colitis 05/19/2017  . Hypotension 05/19/2017  . Fibromyalgia 03/19/2017  . Healthcare maintenance 02/12/2016  . Migraine headache with aura 02/12/2016  . Diabetic neuropathy ([place]) 02/06/2016  . Depression   . Herpes genitalis in women 07/16/2015  . Chronic anticoagulation 05/30/2015  . Family history of coronary artery disease in sister 05/30/2015  . Chronic diastolic CHF (congestive heart failure) ([place]) 05/30/2015  . Chest pain with moderate risk of acute coronary syndrome 05/29/2015  . Dyspnea on exertion   . Essential hypertension   . RLS (restless legs syndrome) 04/27/2015  . Lipoma 02/08/2015  . Bipolar disorder ([place]) 01/23/2015  . Insomnia 01/23/2015  . Chronic back pain 01/04/2015  . Permanent atrial fibrillation 01/04/2015  . DM2 (diabetes mellitus, type 2) ([place])   . Hyperlipidemia     Standley Brooking, PTA 12/14/2018, 3:55 PM  [facility] [address] [place], [place], [address] Phone: [phone]    Fax:  [phone]  Name: ALAENA STRADER MRN: [id number] Date of Birth: [birth year]

## 2018-12-15 ENCOUNTER — Other Ambulatory Visit: Payer: Self-pay

## 2018-12-15 ENCOUNTER — Ambulatory Visit: Payer: Medicare Other | Admitting: Physical Therapy

## 2018-12-15 ENCOUNTER — Encounter: Payer: Self-pay | Admitting: Physical Therapy

## 2018-12-15 DIAGNOSIS — R2689 Other abnormalities of gait and mobility: Secondary | ICD-10-CM | POA: Diagnosis not present

## 2018-12-15 DIAGNOSIS — R262 Difficulty in walking, not elsewhere classified: Secondary | ICD-10-CM | POA: Diagnosis not present

## 2018-12-15 DIAGNOSIS — M79672 Pain in left foot: Secondary | ICD-10-CM | POA: Diagnosis not present

## 2018-12-15 DIAGNOSIS — R0609 Other forms of dyspnea: Secondary | ICD-10-CM | POA: Diagnosis not present

## 2018-12-15 NOTE — Therapy (Signed)
[facility] [place], [place], [address] Phone: [phone]   Fax:  [phone]  Physical Therapy Treatment  Patient Details  Name: Amber Stephenson MRN: [id number] Date of Birth: 1945/09/23 Referring Provider (PT): Steffanie Rainwater, MD   Encounter Date: 12/15/2018  PT End of Session - 12/15/18 1300    Visit Number  10    Number of Visits  12    Date for PT Re-Evaluation  12/31/18    Authorization Type  Progress note every 10th visit; KX modifier after 15th visit    PT Start Time  1302    PT Stop Time  1349    PT Time Calculation (min)  47 min    Activity Tolerance  Patient tolerated treatment well    Behavior During Therapy  [facility] for tasks assessed/performed       Past Medical History:  Diagnosis Date  . Atrial fibrillation ([place])   . Bipolar affective ([place])   . CHF (congestive heart failure) ([place])   . Chronic back pain   . Diabetes mellitus without complication ([place])   . Hyperlipidemia   . Hypertension   . Morbid obesity due to excess calories ([place]) 07/06/2018  . Pain management   . Renal insufficiency     Past Surgical History:  Procedure Laterality Date  . BREAST REDUCTION SURGERY    . EYE SURGERY Right    cateracts  . HAMMER TOE SURGERY    . LEFT HEART CATH AND CORONARY ANGIOGRAPHY N/A 02/03/2018   Procedure: LEFT HEART CATH AND CORONARY ANGIOGRAPHY;  Surgeon: Martinique, Peter M, MD;  Location: [facility];  Service: Cardiovascular;  Laterality: N/A;  . SHOULDER SURGERY Right   . THIGH SURGERY      There were no vitals filed for this visit.  Subjective Assessment - 12/15/18 1300    Subjective  COVID 19 screening performed on patient upon arrival. Patient reports only minimal pain today in L foot.    Pertinent History  DM, chronic kidney disease stage 3, HTN, congestive heart failure, bipolar disorder    Limitations  Standing;Walking;House hold activities    How long can you sit comfortably?  unlimited    How long can you stand comfortably?  10-20 minutes    How long can you walk comfortably?  20 minutes; 30 minutes 11/26/2018    Diagnostic tests  x-ray: normal    Patient Stated Goals  get back to normal    Currently in Pain?  Yes    Pain Score  3     Pain Location  Heel    Pain Orientation  Left    Pain Descriptors / Indicators  Discomfort    Pain Type  Chronic pain    Pain Onset  More than a month ago    Pain Frequency  Intermittent         [facility] PT Assessment - 12/15/18 0001      Assessment   Medical Diagnosis  left plantar fasciitis    Referring Provider (PT)  Steffanie Rainwater, MD    Onset Date/Surgical Date  05/02/18    Next MD Visit  "3 weeks"    Prior Therapy  no      Precautions   Precautions  None      Restrictions   Weight Bearing Restrictions  No                   [facility] Adult PT Treatment/Exercise - 12/15/18 0001  Modalities   Modalities  Electrical Stimulation;Moist Heat      Moist Heat Therapy   Number Minutes Moist Heat  15 Minutes    Moist Heat Location  Ankle      Electrical Stimulation   Electrical Stimulation Location  L heel, medial ankle    Electrical Stimulation Action  Pre-Mod    Electrical Stimulation Parameters  80-150 hz x15 min    Electrical Stimulation Goals  Pain      Manual Therapy   Manual Therapy  Soft tissue mobilization    Soft tissue mobilization  STW/M to L arch, heel to reduce tightness and pain       Ankle Exercises: Aerobic   Nustep  L4 x10 min LEs only      Ankle Exercises: Seated   Heel Raises  Both;20 reps    Toe Raise  20 reps    Other Seated Ankle Exercises  Rockerboard x2 min                  PT Long Term Goals - 11/26/18 1141      PT LONG TERM GOAL #1   Title  Patient will be independent with HEP.    Period  Weeks    Status  Achieved      PT LONG TERM GOAL #2   Title  Patient will demonstrate 5 degrees of left ankle DF AROM to improve gait mechanics.    Time  4    Period  Weeks     Status  Achieved      PT LONG TERM GOAL #3   Title  Patient will demonstrate 5/5 left ankle MMT in all planes to improve stability during functional tasks.     Time  4    Period  Weeks    Status  Achieved      PT LONG TERM GOAL #4   Title  Patient will report ability to perform ADLs with left foot/heel pain as less than 4/10.     Time  4    Period  Weeks    Status  Achieved      PT LONG TERM GOAL #5   Title  Patient will report ability to walk for greater than 20 minutes with left ankle pain less than 4/10.    Time  4    Period  Weeks    Status  Not Met            Plan - 12/15/18 1341    Clinical Impression Statement  --    Personal Factors and Comorbidities  --    Comorbidities  --    Examination-Activity Limitations  --    Stability/Clinical Decision Making  --    Rehab Potential  --    PT Frequency  --    PT Duration  --    PT Treatment/Interventions  --    PT Next Visit Plan  --    PT Home Exercise Plan  --    Consulted and Agree with Plan of Care  --       Patient will benefit from skilled therapeutic intervention in order to improve the following deficits and impairments:  Pain, Decreased activity tolerance, Decreased endurance, Decreased range of motion, Decreased strength, Difficulty walking  Visit Diagnosis: 1. Difficulty in walking, not elsewhere classified   2. Pain in left foot        Problem List Patient Active Problem List   Diagnosis Date Noted  . Obesity (BMI 30-39.9)  07/06/2018  . At risk for sleep apnea 07/06/2018  . Closed fracture of proximal end of left humerus 03/16/2018  . Left humeral fracture 03/06/2018  . Fracture of four ribs of left side, closed, initial encounter 03/06/2018  . Mild intermittent asthma without complication 68/61/0424  . Cardiomegaly 01/12/2018  . NAFL (nonalcoholic fatty liver) 73/19/2438  . Age-related osteoporosis without current pathological fracture 12/15/2017  . Asthma 11/12/2017  . Atrial fibrillation  with RVR ([place]) 05/19/2017  . Microscopic colitis 05/19/2017  . Hypotension 05/19/2017  . Fibromyalgia 03/19/2017  . Healthcare maintenance 02/12/2016  . Migraine headache with aura 02/12/2016  . Diabetic neuropathy ([place]) 02/06/2016  . Depression   . Herpes genitalis in women 07/16/2015  . Chronic anticoagulation 05/30/2015  . Family history of coronary artery disease in sister 05/30/2015  . Chronic diastolic CHF (congestive heart failure) ([place]) 05/30/2015  . Chest pain with moderate risk of acute coronary syndrome 05/29/2015  . Dyspnea on exertion   . Essential hypertension   . RLS (restless legs syndrome) 04/27/2015  . Lipoma 02/08/2015  . Bipolar disorder ([place]) 01/23/2015  . Insomnia 01/23/2015  . Chronic back pain 01/04/2015  . Permanent atrial fibrillation 01/04/2015  . DM2 (diabetes mellitus, type 2) ([place])   . Hyperlipidemia     Standley Brooking, PTA 12/15/2018, 1:54 PM  [facility] [address] [place], [place], [address] Phone: [phone]   Fax:  [phone]  Name: LYNNETT LANGLINAIS MRN: [id number] Date of Birth: 06-Jun-1945

## 2018-12-20 ENCOUNTER — Telehealth: Payer: Self-pay | Admitting: Family Medicine

## 2018-12-20 NOTE — Telephone Encounter (Signed)
appt scheduled Pt notified 

## 2018-12-21 ENCOUNTER — Ambulatory Visit (INDEPENDENT_AMBULATORY_CARE_PROVIDER_SITE_OTHER): Payer: Medicare Other | Admitting: Family Medicine

## 2018-12-21 DIAGNOSIS — B3731 Acute candidiasis of vulva and vagina: Secondary | ICD-10-CM

## 2018-12-21 DIAGNOSIS — R21 Rash and other nonspecific skin eruption: Secondary | ICD-10-CM | POA: Diagnosis not present

## 2018-12-21 DIAGNOSIS — L304 Erythema intertrigo: Secondary | ICD-10-CM

## 2018-12-21 DIAGNOSIS — B373 Candidiasis of vulva and vagina: Secondary | ICD-10-CM | POA: Diagnosis not present

## 2018-12-21 MED ORDER — FLUCONAZOLE 150 MG PO TABS: 150.0000 mg | ORAL_TABLET | Freq: Once | ORAL | 0 refills | Status: AC

## 2018-12-21 NOTE — Progress Notes (Signed)
Telephone visit  Subjective: CC: yeast infection PCP: [name], DO PYP:[id number] D Dieu is a 73 y.o. female calls for telephone consult today. Patient provides verbal consent for consult held via phone.  Location of patient: home Location of provider: Working remotely from home Others present for call: none  1. Yeast infection Patient reports couple day history of vaginal itching and vaginal discharge.  She notes that she was recently placed on antibiotics after a toe surgery.  She believes this to be a yeast infection and is asking for me to send in Diflucan  2.  Inguinal rash Patient has been struggling with a rash in her groin area.  It was previously responding to topical antifungals but she notes that it is now getting worse.  She is asking what the next step is.   ROS: Per HPI  Allergies  Allergen Reactions  . Ivp Dye [Iodinated Diagnostic Agents] Anaphylaxis and Swelling    Throat closes  . Latex     Pulls skin with it   Past Medical History:  Diagnosis Date  . Atrial fibrillation ([place])   . Bipolar affective ([place])   . CHF (congestive heart failure) ([place])   . Chronic back pain   . Diabetes mellitus without complication ([place])   . Hyperlipidemia   . Hypertension   . Morbid obesity due to excess calories ([place]) 07/06/2018  . Pain management   . Renal insufficiency     Current Outpatient Medications:  .  albuterol (PROVENTIL) (2.5 MG/3ML) 0.083% nebulizer solution, Take 3 mLs (2.5 mg total) by nebulization every 6 (six) hours as needed for wheezing or shortness of breath., Disp: 75 mL, Rfl: 1 .  albuterol (VENTOLIN HFA) 108 (90 Base) MCG/ACT inhaler, Inhale 2 puffs into the lungs every 6 (six) hours as needed for wheezing or shortness of breath., Disp: 1 Inhaler, Rfl: 6 .  apixaban (ELIQUIS) 5 MG TABS tablet, Take 1 tablet (5 mg total) by mouth 2 (two) times daily., Disp: 60 tablet, Rfl: 0 .  ARIPiprazole (ABILIFY) 10 MG tablet, Take 3 tablets (30 mg total) by  mouth daily., Disp: 90 tablet, Rfl: 4 .  atorvastatin (LIPITOR) 40 MG tablet, TAKE 1 TABLET DAILY, Disp: 90 tablet, Rfl: 0 .  Blood Glucose Monitoring Suppl (FREESTYLE LITE) DEVI, Use to check blood sugar tid. DX E11.22, Disp: 1 each, Rfl: 0 .  budesonide (ENTOCORT EC) 3 MG 24 hr capsule, Take 9 mg by mouth every morning., Disp: , Rfl:  .  budesonide-formoterol (SYMBICORT) 80-4.5 MCG/ACT inhaler, Inhale 2 puffs into the lungs 2 (two) times daily., Disp: 2 Inhaler, Rfl: 0 .  carbamazepine (CARBATROL) 100 MG 12 hr capsule, Take 1 capsule (100 mg total) by mouth daily., Disp: 30 capsule, Rfl: 5 .  Cholecalciferol (VITAMIN D3) 1000 UNITS CAPS, Take 1,000 Units by mouth daily., Disp: , Rfl:  .  diltiazem (CARDIZEM CD) 120 MG 24 hr capsule, TAKE 1 CAPSULE AT BEDTIME, Disp: 90 capsule, Rfl: 3 .  Dulaglutide (TRULICITY) 1.5 IZ/1.2WP SOPN, Inject 1.5 mg into the skin once a week., Disp: 12 pen, Rfl: 3 .  Eszopiclone (ESZOPICLONE) 3 MG TABS, Take 1 tablet (3 mg total) by mouth at bedtime as needed. Take immediately before bedtime, Disp: 30 tablet, Rfl: 4 .  furosemide (LASIX) 20 MG tablet, Take 1 tablet (20 mg total) by mouth 2 (two) times daily., Disp: 180 tablet, Rfl: 1 .  glucose blood (FREESTYLE LITE) test strip, USE FOR TESTING SUGAR THREE TIMES DAILY, Disp: 300 each,  Rfl: 3 .  Icosapent Ethyl (VASCEPA) 1 g CAPS, Take 2 capsules (2 g total) by mouth 2 (two) times daily with a meal., Disp: 360 capsule, Rfl: 0 .  Insulin Glargine (LANTUS SOLOSTAR) 100 UNIT/ML Solostar Pen, INJECT 60 UNITS UNDER THE SKIN TWICE A DAY (Patient taking differently: Inject 60 Units into the skin 2 (two) times daily. ), Disp: 90 mL, Rfl: 0 .  Insulin Pen Needle (SURE COMFORT PEN NEEDLES) 32G X 4 MM MISC, Use with insulin pens as directed. Dx E11.9, Disp: 300 each, Rfl: 11 .  Lancets (FREESTYLE) lancets, Test tid. DX E11.22, Disp: 100 each, Rfl: 5 .  LORazepam (ATIVAN) 1 MG tablet, Take 1 tab (1 mg) in the morning and 2 tabs (2 mg  ) at bedtime, Disp: 90 tablet, Rfl: 4 .  metoprolol tartrate (LOPRESSOR) 100 MG tablet, TAKE 1 TABLET TWICE A DAY WITH METOPROLOL 25 MG TO EQUAL 125 MG, Disp: 180 tablet, Rfl: 3 .  metoprolol tartrate (LOPRESSOR) 25 MG tablet, Take 1 tablet (25 mg total) by mouth 2 (two) times daily. Take with metoprolol 100 mg to equal 125 mg, Disp: 28 tablet, Rfl: 0 .  naloxone (NARCAN) nasal spray 4 mg/0.1 mL, Place 1 spray into the nose once as needed (opiod overdose)., Disp: , Rfl:  .  nystatin (MYCOSTATIN/NYSTOP) powder, Apply topically 3 (three) times daily. x2 weeks (for groin/breast rash), Disp: 60 g, Rfl: 1 .  pregabalin (LYRICA) 150 MG capsule, Take 1 capsule (150 mg total) by mouth 2 (two) times daily., Disp: 180 capsule, Rfl: 4 .  rizatriptan (MAXALT-MLT) 10 MG disintegrating tablet, DISSOLVE 1 TABLET ON THE TONGUE AT FIRST SIGN OF HEADACHE. MAY REPEAT ONE DOSE IN 1 HOUR IF NEEDED, Disp: 10 tablet, Rfl: 2 .  tizanidine (ZANAFLEX) 2 MG capsule, Take 1 capsule (2 mg total) by mouth 3 (three) times daily as needed for muscle spasms., Disp: 30 capsule, Rfl: 0 .  topiramate (TOPAMAX) 25 MG tablet, 1 tablet in the morning and 3 tablets every night, Disp: 120 tablet, Rfl: 11 .  traZODone (DESYREL) 100 MG tablet, 4  qhs, Disp: 120 tablet, Rfl: 5  Assessment/ Plan: 73 y.o. female   1. Yeast vaginitis Diflucan sent to the pharmacy.  She may take 1 dose today and repeat in 3 days if needed - fluconazole (DIFLUCAN) 150 MG tablet; Take 1 tablet (150 mg total) by mouth once for 1 dose.  Dispense: 2 tablet; Refill: 0  2. Rash and nonspecific skin eruption Previously diagnosed as intertrigo but patient no longer responding to topical antifungals.  I will place a referral to dermatology for further evaluation and management. - Ambulatory referral to Dermatology  3. Intertrigo - Ambulatory referral to Dermatology   Start time: 10:33am End time: 10:38am  Total time spent on patient care (including telephone  call/ virtual visit): 12 minutes  [place], [place] [phone]

## 2018-12-22 ENCOUNTER — Ambulatory Visit: Payer: Medicare Other | Admitting: Physical Therapy

## 2018-12-23 ENCOUNTER — Ambulatory Visit (INDEPENDENT_AMBULATORY_CARE_PROVIDER_SITE_OTHER): Payer: Medicare Other | Admitting: Psychiatry

## 2018-12-23 DIAGNOSIS — F3160 Bipolar disorder, current episode mixed, unspecified: Secondary | ICD-10-CM | POA: Diagnosis not present

## 2018-12-23 MED ORDER — LORAZEPAM 1 MG PO TABS: ORAL_TABLET | ORAL | 4 refills | Status: DC

## 2018-12-23 MED ORDER — CARBAMAZEPINE ER 100 MG PO CP12: 100.0000 mg | ORAL_CAPSULE | Freq: Every day | ORAL | 1 refills | Status: DC

## 2018-12-23 MED ORDER — ESZOPICLONE 3 MG PO TABS: 3.0000 mg | ORAL_TABLET | Freq: Every evening | ORAL | 1 refills | Status: DC | PRN

## 2018-12-23 MED ORDER — TRAZODONE HCL 100 MG PO TABS: ORAL_TABLET | ORAL | 1 refills | Status: DC

## 2018-12-23 MED ORDER — ARIPIPRAZOLE 10 MG PO TABS: 30.0000 mg | ORAL_TABLET | Freq: Every day | ORAL | 4 refills | Status: DC

## 2018-12-23 NOTE — Progress Notes (Signed)
Patient ID: KALAYNA NOY, female   DOB: [birth year], 73 y.o.   MRN: [id number] [facility] MD Progress Note .she's going to apply 12/23/2018 2:35 PM REASE SWINSON  MRN:  [id number] Subjective:  Doing well Principal Problem: Bipolar disorder manic type Diagnosis: Bipolar disorder  Today the patient is doing well.  She denies daily depression.  She actually is sleeping and eating well.  She shows no signs of irritability or euphoria.  Her daughter is on and off drugs.  She is not with her at this time.  The patient's husband is doing well.  The patient is on multiple medications and takes them just as prescribed.  Patient denies any chest pain or shortness of breath.  She is having a lot of foot pain from plantar fasciitis.  Patient is being treated for this condition.  Patient's had no falls.  She denies chest pain shortness of breath or any respiratory complaints.  She has no signs of an infection.  The patient seems to be stable and calm.  The patient is functioning very well.  On her next visit we will insist that she get some blood work.  We will make an attempt to try to get in the next week but if not we will clarify its importance at her next visit.   Past Medical History:  Diagnosis Date  . Atrial fibrillation ([place])   . Bipolar affective ([place])   . CHF (congestive heart failure) ([place])   . Chronic back pain   . Diabetes mellitus without complication ([place])   . Hyperlipidemia   . Hypertension   . Morbid obesity due to excess calories ([place]) 07/06/2018  . Pain management   . Renal insufficiency     Past Surgical History:  Procedure Laterality Date  . BREAST REDUCTION SURGERY    . EYE SURGERY Right    cateracts  . HAMMER TOE SURGERY    . LEFT HEART CATH AND CORONARY ANGIOGRAPHY N/A 02/03/2018   Procedure: LEFT HEART CATH AND CORONARY ANGIOGRAPHY;  Surgeon: Martinique, Peter M, MD;  Location: [place] [facility];  Service: Cardiovascular;  Laterality: N/A;  . SHOULDER SURGERY Right   . THIGH  SURGERY     Family History:  Family History  Problem Relation Age of Onset  . Diabetes Mother   . Heart disease Mother   . Heart disease Brother   . Heart disease Sister        CABG  . Alcohol abuse Sister   . Mental illness Brother   . Diabetes Brother    Family Psychiatric  History:  Social History:  Social History   Substance and Sexual Activity  Alcohol Use No  . Alcohol/week: 0.0 standard drinks     Social History   Substance and Sexual Activity  Drug Use No    Social History   Socioeconomic History  . Marital status: Married    Spouse name: Orpah Greek  . Number of children: 3  . Years of education: 16  . Highest education level: Bachelor's degree (e.g., BA, AB, BS)  Occupational History  . Occupation: Retired  Scientific laboratory technician  . Financial resource strain: Not hard at all  . Food insecurity    Worry: Never true    Inability: Never true  . Transportation needs    Medical: No    Non-medical: No  Tobacco Use  . Smoking status: Never Smoker  . Smokeless tobacco: Never Used  Substance and Sexual Activity  . Alcohol use: No  Alcohol/week: 0.0 standard drinks  . Drug use: No  . Sexual activity: Yes    Partners: Male    Birth control/protection: Post-menopausal  Lifestyle  . Physical activity    Days per week: 0 days    Minutes per session: 0 min  . Stress: Not at all  Relationships  . Social connections    Talks on phone: More than three times a week    Gets together: Once a week    Attends religious service: More than 4 times per year    Active member of club or organization: No    Attends meetings of clubs or organizations: Never    Relationship status: Married  Other Topics Concern  . Not on file  Social History Narrative   Lives at home with husband.    Additional Social History:                         Sleep: Good  Appetite:  Good  Current Medications: Current Outpatient Medications  Medication Sig Dispense Refill  . albuterol  (PROVENTIL) (2.5 MG/3ML) 0.083% nebulizer solution Take 3 mLs (2.5 mg total) by nebulization every 6 (six) hours as needed for wheezing or shortness of breath. 75 mL 1  . albuterol (VENTOLIN HFA) 108 (90 Base) MCG/ACT inhaler Inhale 2 puffs into the lungs every 6 (six) hours as needed for wheezing or shortness of breath. 1 Inhaler 6  . apixaban (ELIQUIS) 5 MG TABS tablet Take 1 tablet (5 mg total) by mouth 2 (two) times daily. 60 tablet 0  . ARIPiprazole (ABILIFY) 10 MG tablet Take 3 tablets (30 mg total) by mouth daily. 90 tablet 4  . atorvastatin (LIPITOR) 40 MG tablet TAKE 1 TABLET DAILY 90 tablet 0  . Blood Glucose Monitoring Suppl (FREESTYLE LITE) DEVI Use to check blood sugar tid. DX E11.22 1 each 0  . budesonide (ENTOCORT EC) 3 MG 24 hr capsule Take 9 mg by mouth every morning.    . budesonide-formoterol (SYMBICORT) 80-4.5 MCG/ACT inhaler Inhale 2 puffs into the lungs 2 (two) times daily. 2 Inhaler 0  . carbamazepine (CARBATROL) 100 MG 12 hr capsule Take 1 capsule (100 mg total) by mouth daily. 90 capsule 1  . Cholecalciferol (VITAMIN D3) 1000 UNITS CAPS Take 1,000 Units by mouth daily.    Marland Kitchen diltiazem (CARDIZEM CD) 120 MG 24 hr capsule TAKE 1 CAPSULE AT BEDTIME 90 capsule 3  . Dulaglutide (TRULICITY) 1.5 PP/5.0DT SOPN Inject 1.5 mg into the skin once a week. 12 pen 3  . Eszopiclone (ESZOPICLONE) 3 MG TABS Take 1 tablet (3 mg total) by mouth at bedtime as needed. Take immediately before bedtime 90 tablet 1  . furosemide (LASIX) 20 MG tablet Take 1 tablet (20 mg total) by mouth 2 (two) times daily. 180 tablet 1  . glucose blood (FREESTYLE LITE) test strip USE FOR TESTING SUGAR THREE TIMES DAILY 300 each 3  . Icosapent Ethyl (VASCEPA) 1 g CAPS Take 2 capsules (2 g total) by mouth 2 (two) times daily with a meal. 360 capsule 0  . Insulin Glargine (LANTUS SOLOSTAR) 100 UNIT/ML Solostar Pen INJECT 60 UNITS UNDER THE SKIN TWICE A DAY (Patient taking differently: Inject 60 Units into the skin 2 (two)  times daily. ) 90 mL 0  . Insulin Pen Needle (SURE COMFORT PEN NEEDLES) 32G X 4 MM MISC Use with insulin pens as directed. Dx E11.9 300 each 11  . Lancets (FREESTYLE) lancets Test tid. DX E11.22  100 each 5  . LORazepam (ATIVAN) 1 MG tablet Take 1 tab (1 mg) in the morning and 2 tabs (2 mg ) at bedtime 90 tablet 4  . metoprolol tartrate (LOPRESSOR) 100 MG tablet TAKE 1 TABLET TWICE A DAY WITH METOPROLOL 25 MG TO EQUAL 125 MG 180 tablet 3  . metoprolol tartrate (LOPRESSOR) 25 MG tablet Take 1 tablet (25 mg total) by mouth 2 (two) times daily. Take with metoprolol 100 mg to equal 125 mg 28 tablet 0  . naloxone (NARCAN) nasal spray 4 mg/0.1 mL Place 1 spray into the nose once as needed (opiod overdose).    Marland Kitchen nystatin (MYCOSTATIN/NYSTOP) powder Apply topically 3 (three) times daily. x2 weeks (for groin/breast rash) 60 g 1  . pregabalin (LYRICA) 150 MG capsule Take 1 capsule (150 mg total) by mouth 2 (two) times daily. 180 capsule 4  . rizatriptan (MAXALT-MLT) 10 MG disintegrating tablet DISSOLVE 1 TABLET ON THE TONGUE AT FIRST SIGN OF HEADACHE. MAY REPEAT ONE DOSE IN 1 HOUR IF NEEDED 10 tablet 2  . tizanidine (ZANAFLEX) 2 MG capsule Take 1 capsule (2 mg total) by mouth 3 (three) times daily as needed for muscle spasms. 30 capsule 0  . topiramate (TOPAMAX) 25 MG tablet 1 tablet in the morning and 3 tablets every night 120 tablet 11  . traZODone (DESYREL) 100 MG tablet 4  qhs 120 tablet 5   No current facility-administered medications for this visit.     Lab Results: No results found for this or any previous visit (from the past 48 hour(s)).  Blood Alcohol level:  Lab Results  Component Value Date   ETH <5 10/01/2015    Physical Findings: AIMS:  , ,  ,  ,    CIWA:    COWS:     Musculoskeletal: Strength & Muscle Tone: within normal limits Gait & Station: normal Patient leans: N/A  Psychiatric Specialty Exam: ROS Patient ID: WALTERINE AMODEI, female   DOB: 23-Jan-1946, 73 y.o.   MRN:  [id number] [employer] MD Progress Note .she's going to apply 12/23/2018 2:35 PM RICKI VANHANDEL  MRN:  [id number] Subjective:  Doing well Principal Problem: Bipolar disorder most recent episode manic Diagnosis:  Bipolar disorder most recent   Today the patient is seen with her husband. Actually this is about a month earlier than expected. The actually having no new problems. The patient does describe her anxiety level is somewhat higher bullous suspect is related to the fact that her daughter who is recovering out of his living with them now. Apparently does some degree of tension between her and her daughter who she is with every day. Patient also has multiple medical problems.She seen doctors every week having tests. The patient denies depression. She denies any signs of mania. She's not irritable. She is not 4. She does not have racing thinking. Her appetite is normal. She does complain other sleep is not for her. She's having hard time staying asleep. She goes to sleep and takes 3 trazodone. Patient is no evidence of psychosis. She is not grandiose. She's not suicidal. Her husband is stable. They likely home they live in. The patient takes her medicines as prescribed. The patient drinks no alcohol uses no drugs. They're planning a trip to [place]. The patient shows no evidence of tardive dyskinesia. Past Medical History:  Diagnosis Date  . Atrial fibrillation ([place])   . Bipolar affective ([place])   . CHF (congestive heart failure) ([place])   . Chronic back  pain   . Diabetes mellitus without complication ([place])   . Hyperlipidemia   . Hypertension   . Morbid obesity due to excess calories ([place]) 07/06/2018  . Pain management   . Renal insufficiency     Past Surgical History:  Procedure Laterality Date  . BREAST REDUCTION SURGERY    . EYE SURGERY Right    cateracts  . HAMMER TOE SURGERY    . LEFT HEART CATH AND CORONARY ANGIOGRAPHY N/A 02/03/2018   Procedure: LEFT HEART CATH AND CORONARY  ANGIOGRAPHY;  Surgeon: Martinique, Peter M, MD;  Location: [place] [facility];  Service: Cardiovascular;  Laterality: N/A;  . SHOULDER SURGERY Right   . THIGH SURGERY     Family History:  Family History  Problem Relation Age of Onset  . Diabetes Mother   . Heart disease Mother   . Heart disease Brother   . Heart disease Sister        CABG  . Alcohol abuse Sister   . Mental illness Brother   . Diabetes Brother    Family Psychiatric  History:  Social History:  Social History   Substance and Sexual Activity  Alcohol Use No  . Alcohol/week: 0.0 standard drinks     Social History   Substance and Sexual Activity  Drug Use No    Social History   Socioeconomic History  . Marital status: Married    Spouse name: Orpah Greek  . Number of children: 3  . Years of education: 16  . Highest education level: Bachelor's degree (e.g., BA, AB, BS)  Occupational History  . Occupation: Retired  Scientific laboratory technician  . Financial resource strain: Not hard at all  . Food insecurity    Worry: Never true    Inability: Never true  . Transportation needs    Medical: No    Non-medical: No  Tobacco Use  . Smoking status: Never Smoker  . Smokeless tobacco: Never Used  Substance and Sexual Activity  . Alcohol use: No    Alcohol/week: 0.0 standard drinks  . Drug use: No  . Sexual activity: Yes    Partners: Male    Birth control/protection: Post-menopausal  Lifestyle  . Physical activity    Days per week: 0 days    Minutes per session: 0 min  . Stress: Not at all  Relationships  . Social connections    Talks on phone: More than three times a week    Gets together: Once a week    Attends religious service: More than 4 times per year    Active member of club or organization: No    Attends meetings of clubs or organizations: Never    Relationship status: Married  Other Topics Concern  . Not on file  Social History Narrative   Lives at home with husband.    Additional Social History:                          Sleep: Good  Appetite:  Good  Current Medications: Current Outpatient Medications  Medication Sig Dispense Refill  . albuterol (PROVENTIL) (2.5 MG/3ML) 0.083% nebulizer solution Take 3 mLs (2.5 mg total) by nebulization every 6 (six) hours as needed for wheezing or shortness of breath. 75 mL 1  . albuterol (VENTOLIN HFA) 108 (90 Base) MCG/ACT inhaler Inhale 2 puffs into the lungs every 6 (six) hours as needed for wheezing or shortness of breath. 1 Inhaler 6  . apixaban (ELIQUIS) 5 MG TABS  tablet Take 1 tablet (5 mg total) by mouth 2 (two) times daily. 60 tablet 0  . ARIPiprazole (ABILIFY) 10 MG tablet Take 3 tablets (30 mg total) by mouth daily. 90 tablet 4  . atorvastatin (LIPITOR) 40 MG tablet TAKE 1 TABLET DAILY 90 tablet 0  . Blood Glucose Monitoring Suppl (FREESTYLE LITE) DEVI Use to check blood sugar tid. DX E11.22 1 each 0  . budesonide (ENTOCORT EC) 3 MG 24 hr capsule Take 9 mg by mouth every morning.    . budesonide-formoterol (SYMBICORT) 80-4.5 MCG/ACT inhaler Inhale 2 puffs into the lungs 2 (two) times daily. 2 Inhaler 0  . carbamazepine (CARBATROL) 100 MG 12 hr capsule Take 1 capsule (100 mg total) by mouth daily. 90 capsule 1  . Cholecalciferol (VITAMIN D3) 1000 UNITS CAPS Take 1,000 Units by mouth daily.    Marland Kitchen diltiazem (CARDIZEM CD) 120 MG 24 hr capsule TAKE 1 CAPSULE AT BEDTIME 90 capsule 3  . Dulaglutide (TRULICITY) 1.5 VE/9.3YB SOPN Inject 1.5 mg into the skin once a week. 12 pen 3  . Eszopiclone (ESZOPICLONE) 3 MG TABS Take 1 tablet (3 mg total) by mouth at bedtime as needed. Take immediately before bedtime 90 tablet 1  . furosemide (LASIX) 20 MG tablet Take 1 tablet (20 mg total) by mouth 2 (two) times daily. 180 tablet 1  . glucose blood (FREESTYLE LITE) test strip USE FOR TESTING SUGAR THREE TIMES DAILY 300 each 3  . Icosapent Ethyl (VASCEPA) 1 g CAPS Take 2 capsules (2 g total) by mouth 2 (two) times daily with a meal. 360 capsule 0  . Insulin  Glargine (LANTUS SOLOSTAR) 100 UNIT/ML Solostar Pen INJECT 60 UNITS UNDER THE SKIN TWICE A DAY (Patient taking differently: Inject 60 Units into the skin 2 (two) times daily. ) 90 mL 0  . Insulin Pen Needle (SURE COMFORT PEN NEEDLES) 32G X 4 MM MISC Use with insulin pens as directed. Dx E11.9 300 each 11  . Lancets (FREESTYLE) lancets Test tid. DX E11.22 100 each 5  . LORazepam (ATIVAN) 1 MG tablet Take 1 tab (1 mg) in the morning and 2 tabs (2 mg ) at bedtime 90 tablet 4  . metoprolol tartrate (LOPRESSOR) 100 MG tablet TAKE 1 TABLET TWICE A DAY WITH METOPROLOL 25 MG TO EQUAL 125 MG 180 tablet 3  . metoprolol tartrate (LOPRESSOR) 25 MG tablet Take 1 tablet (25 mg total) by mouth 2 (two) times daily. Take with metoprolol 100 mg to equal 125 mg 28 tablet 0  . naloxone (NARCAN) nasal spray 4 mg/0.1 mL Place 1 spray into the nose once as needed (opiod overdose).    Marland Kitchen nystatin (MYCOSTATIN/NYSTOP) powder Apply topically 3 (three) times daily. x2 weeks (for groin/breast rash) 60 g 1  . pregabalin (LYRICA) 150 MG capsule Take 1 capsule (150 mg total) by mouth 2 (two) times daily. 180 capsule 4  . rizatriptan (MAXALT-MLT) 10 MG disintegrating tablet DISSOLVE 1 TABLET ON THE TONGUE AT FIRST SIGN OF HEADACHE. MAY REPEAT ONE DOSE IN 1 HOUR IF NEEDED 10 tablet 2  . tizanidine (ZANAFLEX) 2 MG capsule Take 1 capsule (2 mg total) by mouth 3 (three) times daily as needed for muscle spasms. 30 capsule 0  . topiramate (TOPAMAX) 25 MG tablet 1 tablet in the morning and 3 tablets every night 120 tablet 11  . traZODone (DESYREL) 100 MG tablet 4  qhs 120 tablet 5   No current facility-administered medications for this visit.     Lab Results:  No results found for this or any previous visit (from the past 48 hour(s)).  Blood Alcohol level:  Lab Results  Component Value Date   ETH <5 10/01/2015    Physical Findings: AIMS:  , ,  ,  ,    CIWA:    COWS:     Musculoskeletal: Strength & Muscle Tone: within normal  limits Gait & Station: normal Patient leans: N/A  Psychiatric Specialty Exam: ROS  There were no vitals taken for this visit.There is no height or weight on file to calculate BMI.  General Appearance: Casual  Eye Contact::  Good  Speech:  Clear and Coherent  Volume:  Normal  Mood:  Euthymic  Affect:  Appropriate  Thought Process:  Coherent  Orientation:  Full (Time, Place, and Person)  Thought Content:  WDL  Suicidal Thoughts:  No  Homicidal Thoughts:  No  Memory:  NA  Judgement:  Good  Insight:  Good  Psychomotor Activity:  Normal  Concentration:  Good  Recall:  Good  Fund of Knowledge:Good  Language: Good  Akathisia:  No  Handed:  Right  AIMS (if indicated):     Assets: Good spirits   ADL's:  Intact  Cognition: WNL  Sleep:     eks Treatment Plan Summary: This patient's first admission important problem is bipolar disorder. She takes multiple medications for this condition. This includes Tegretol and Abilify. At this time the patient is not in therapy. She'll make an attempt to get a therapist in her community. She's taking a moderate to high dose of Ativan 1 mg one in the morning and 2 at night and requested more of it. At this time we will not increase the dose of Ativan. We will have her response to her second problem which is her problems sleeping. She takes 3 trazodone to sleep and doesn't stay asleep. At this time she'll continue taking the trazodone but she'll begin on doxepin 50 mg in addition. Today we spent more than 50% of the time talking about her medications and educating her. She understands that she cannot drink alcohol with her medications and she'll actually does not. The patient stays busy and active. She does importance of being in therapy and she'll actively looking for a therapist at this time. She'll return to see me in 3 months for a 30 minute visit. At that time consider getting a Tegretol blood level and other blood work.patient denies any physical  complaints of chest pain shortness of breath.  There were no vitals taken for this visit.There is no height or weight on file to calculate BMI.  General Appearance: Casual  Eye Contact::  Good  Speech:  Clear and Coherent  Volume:  Normal  Mood:  Euthymic  Affect:  Appropriate  Thought Process:  Coherent  Orientation:  Full (Time, Place, and Person)  Thought Content:  WDL  Suicidal Thoughts:  No  Homicidal Thoughts:  No  Memory:  NA  Judgement:  Good  Insight:  Good  Psychomotor Activity:  Normal  Concentration:  Good  Recall:  Good  Fund of Knowledge:Good  Language: Good  Akathisia:  No  Handed:  Right  AIMS (if indicated):     Assets: Good spirits   ADL's:  Intact  Cognition: WNL  Sleep:     eks Treatment Plan Summary:  Today the patient is doing well.  It is noted that she is not had any blood work in a while.  We will go ahead and make arranges  for her to get it at [facility].  The patient will continue taking Tegretol 100 mg once a day.  This is for her first problem of bipolar disorder.  At this time she is doing great.  Should continue taking her Abilify as prescribed as well.  Her second problem is that of insomnia.  Should continue taking trazodone which we will increase to 100 mg for today.  She no longer takes doxepin.  Patient will continue taking Ativan 1 mg 1 in the morning and 2 at night.  I suspect overall this helps both her bipolar state as well as her insomnia.  Patient will also continue taking Lunesta 3 mg as prescribed.  This patient will be seen again in 3 months.  I believe she is very stable.  She is functioning well.  The blood work the patient needs is a morning Tegretol level, comprehensive metabolic panel and a CBC.

## 2018-12-24 ENCOUNTER — Ambulatory Visit: Payer: Medicare Other | Admitting: Physical Therapy

## 2018-12-28 ENCOUNTER — Other Ambulatory Visit: Payer: Self-pay | Admitting: Family Medicine

## 2018-12-29 DIAGNOSIS — E1140 Type 2 diabetes mellitus with diabetic neuropathy, unspecified: Secondary | ICD-10-CM | POA: Diagnosis not present

## 2018-12-29 DIAGNOSIS — E1165 Type 2 diabetes mellitus with hyperglycemia: Secondary | ICD-10-CM | POA: Diagnosis not present

## 2018-12-29 DIAGNOSIS — Z794 Long term (current) use of insulin: Secondary | ICD-10-CM | POA: Diagnosis not present

## 2018-12-31 DIAGNOSIS — M419 Scoliosis, unspecified: Secondary | ICD-10-CM | POA: Diagnosis not present

## 2018-12-31 DIAGNOSIS — M5136 Other intervertebral disc degeneration, lumbar region: Secondary | ICD-10-CM | POA: Diagnosis not present

## 2018-12-31 DIAGNOSIS — Z79899 Other long term (current) drug therapy: Secondary | ICD-10-CM | POA: Diagnosis not present

## 2018-12-31 DIAGNOSIS — M545 Low back pain: Secondary | ICD-10-CM | POA: Diagnosis not present

## 2018-12-31 DIAGNOSIS — S32010A Wedge compression fracture of first lumbar vertebra, initial encounter for closed fracture: Secondary | ICD-10-CM | POA: Diagnosis not present

## 2019-01-04 ENCOUNTER — Other Ambulatory Visit: Payer: Self-pay

## 2019-01-04 ENCOUNTER — Ambulatory Visit: Payer: Medicare Other | Attending: Podiatry | Admitting: Physical Therapy

## 2019-01-04 ENCOUNTER — Telehealth: Payer: Self-pay | Admitting: Family Medicine

## 2019-01-04 ENCOUNTER — Encounter: Payer: Self-pay | Admitting: Physical Therapy

## 2019-01-04 DIAGNOSIS — R262 Difficulty in walking, not elsewhere classified: Secondary | ICD-10-CM | POA: Diagnosis not present

## 2019-01-04 DIAGNOSIS — M722 Plantar fascial fibromatosis: Secondary | ICD-10-CM | POA: Diagnosis not present

## 2019-01-04 DIAGNOSIS — M79672 Pain in left foot: Secondary | ICD-10-CM | POA: Diagnosis not present

## 2019-01-04 DIAGNOSIS — R2689 Other abnormalities of gait and mobility: Secondary | ICD-10-CM | POA: Diagnosis not present

## 2019-01-04 NOTE — Therapy (Addendum)
[facility] [place], [place], [address] Phone: [phone]   Fax:  [phone]  Physical Therapy Treatment PHYSICAL THERAPY DISCHARGE SUMMARY  Visits from Start of Care: 1  Current functional level related to goals / functional outcomes: See below   Remaining deficits: See goals   Education / Equipment: HEP Plan: Patient agrees to discharge.  Patient goals were partially met. Patient is being discharged due to being pleased with the current functional level.  ?????  Gabriela Eves, PT, DPT 08/08/20   Patient Details  Name: Amber Stephenson MRN: [id number] Date of Birth: 06/26/1945 Referring Provider (PT): Steffanie Rainwater, MD   Encounter Date: 01/04/2019  PT End of Session - 01/04/19 1122    Visit Number  11    Number of Visits  12    Date for PT Re-Evaluation  12/31/18    Authorization Type  Progress note every 10th visit; KX modifier after 15th visit    PT Start Time  1122    PT Stop Time  1203    PT Time Calculation (min)  41 min    Activity Tolerance  Patient tolerated treatment well    Behavior During Therapy  [facility] for tasks assessed/performed       Past Medical History:  Diagnosis Date  . Atrial fibrillation ([place])   . Bipolar affective ([place])   . CHF (congestive heart failure) ([place])   . Chronic back pain   . Diabetes mellitus without complication ([place])   . Hyperlipidemia   . Hypertension   . Morbid obesity due to excess calories ([place]) 07/06/2018  . Pain management   . Renal insufficiency     Past Surgical History:  Procedure Laterality Date  . BREAST REDUCTION SURGERY    . EYE SURGERY Right    cateracts  . HAMMER TOE SURGERY    . LEFT HEART CATH AND CORONARY ANGIOGRAPHY N/A 02/03/2018   Procedure: LEFT HEART CATH AND CORONARY ANGIOGRAPHY;  Surgeon: Martinique, Peter M, MD;  Location: [place] [facility];  Service: Cardiovascular;  Laterality: N/A;  . SHOULDER SURGERY Right   . THIGH SURGERY      There  were no vitals filed for this visit.  Subjective Assessment - 01/04/19 1121    Subjective  COVID 19 screening performed on patient upn arrival. Patient reports recently pain beginning on the medial side of L foot. reports that she sees Dr. Irving Shows this afternoon.    Pertinent History  DM, chronic kidney disease stage 3, HTN, congestive heart failure, bipolar disorder    Limitations  Standing;Walking;House hold activities    How long can you sit comfortably?  unlimited    How long can you stand comfortably?  10-20 minutes    How long can you walk comfortably?  20 minutes; 30 minutes 11/26/2018    Diagnostic tests  x-ray: normal    Patient Stated Goals  get back to normal    Currently in Pain?  Yes    Pain Score  9     Pain Location  Foot    Pain Orientation  Left;Medial    Pain Descriptors / Indicators  Sharp    Pain Type  Chronic pain    Pain Onset  More than a month ago    Pain Frequency  Intermittent    Aggravating Factors   Walking, standing, dependent sitting         [facility] PT Assessment - 01/04/19 0001      Assessment   Medical  Diagnosis  left plantar fasciitis    Referring Provider (PT)  Steffanie Rainwater, MD    Onset Date/Surgical Date  05/02/18    Next MD Visit  "3 weeks"    Prior Therapy  no      Precautions   Precautions  None      Restrictions   Weight Bearing Restrictions  No                   [facility] Adult PT Treatment/Exercise - 01/04/19 0001      Modalities   Modalities  Electrical Stimulation;Moist Heat;Ultrasound      Moist Heat Therapy   Number Minutes Moist Heat  10 Minutes    Moist Heat Location  Ankle      Electrical Stimulation   Electrical Stimulation Location  L heel, medial ankle    Electrical Stimulation Action  Pre-Mod    Electrical Stimulation Parameters  80-150 hz x34mn    Electrical Stimulation Goals  Pain      Ultrasound   Ultrasound Location  L PF, medial ankle    Ultrasound Parameters  1.2 w/cm2,100%, 1 mhz x10 min     Ultrasound Goals  Pain      Manual Therapy   Manual Therapy  Soft tissue mobilization    Soft tissue mobilization  STW to L PF, ATFL, posterior tibialis to reduce pain                  PT Long Term Goals - 11/26/18 1141      PT LONG TERM GOAL #1   Title  Patient will be independent with HEP.    Period  Weeks    Status  Achieved      PT LONG TERM GOAL #2   Title  Patient will demonstrate 5 degrees of left ankle DF AROM to improve gait mechanics.    Time  4    Period  Weeks    Status  Achieved      PT LONG TERM GOAL #3   Title  Patient will demonstrate 5/5 left ankle MMT in all planes to improve stability during functional tasks.     Time  4    Period  Weeks    Status  Achieved      PT LONG TERM GOAL #4   Title  Patient will report ability to perform ADLs with left foot/heel pain as less than 4/10.     Time  4    Period  Weeks    Status  Achieved      PT LONG TERM GOAL #5   Title  Patient will report ability to walk for greater than 20 minutes with left ankle pain less than 4/10.    Time  4    Period  Weeks    Status  Not Met            Plan - 01/04/19 1159    Clinical Impression Statement  Patient presented in clinic with reports of midfoot pain in L medial ankle. Patient still experiencing L heel pain especially upon waking in the mornings. Patient palpably tender along L tibialis anterior tendon, ATFL, and navicular region. Patient educated to ice every few hours for 10-15 minutes at a time for pain. Normal modalities response noted following removal of the modalities. Patient experienced pain upon standing following end of treatment.    Personal Factors and Comorbidities  Age;Comorbidity 3+    Comorbidities  DM, chronic kidney disease stage 3, HTN,  congestive heart failure, bipolar disorder    Examination-Activity Limitations  Stairs;Stand;Locomotion Level    Stability/Clinical Decision Making  Stable/Uncomplicated    Rehab Potential  Good    PT  Frequency  2x / week    PT Duration  4 weeks    PT Treatment/Interventions  ADLs/Self Care Home Management;Electrical Stimulation;Iontophoresis 3m/ml Dexamethasone;Moist Heat;Ultrasound;Cryotherapy;Gait training;Stair training;Functional mobility training;Neuromuscular re-education;Balance training;Therapeutic exercise;Therapeutic activities;Patient/family education;Manual techniques;Dry needling;Passive range of motion;Taping;Vasopneumatic Device    PT Next Visit Plan  transition to TEs, Nustep, heel raises, towel crunches modalities PRN Continue TPR/STW to L calf as symptoms dictate. USE SMALL BOLSTER and requires assist with don/doff boot.    PT Home Exercise Plan  standing heel raises, toe raises, and marching    Consulted and Agree with Plan of Care  Patient       Patient will benefit from skilled therapeutic intervention in order to improve the following deficits and impairments:  Pain, Decreased activity tolerance, Decreased endurance, Decreased range of motion, Decreased strength, Difficulty walking  Visit Diagnosis: 1. Difficulty in walking, not elsewhere classified   2. Pain in left foot   3. Other abnormalities of gait and mobility        Problem List Patient Active Problem List   Diagnosis Date Noted  . Obesity (BMI 30-39.9) 07/06/2018  . At risk for sleep apnea 07/06/2018  . Closed fracture of proximal end of left humerus 03/16/2018  . Left humeral fracture 03/06/2018  . Fracture of four ribs of left side, closed, initial encounter 03/06/2018  . Mild intermittent asthma without complication 080/88/1103 . Cardiomegaly 01/12/2018  . NAFL (nonalcoholic fatty liver) 015/94/5859 . Age-related osteoporosis without current pathological fracture 12/15/2017  . Asthma 11/12/2017  . Atrial fibrillation with RVR ([place] 05/19/2017  . Microscopic colitis 05/19/2017  . Hypotension 05/19/2017  . Fibromyalgia 03/19/2017  . Healthcare maintenance 02/12/2016  . Migraine headache with  aura 02/12/2016  . Diabetic neuropathy ([place] 02/06/2016  . Depression   . Herpes genitalis in women 07/16/2015  . Chronic anticoagulation 05/30/2015  . Family history of coronary artery disease in sister 05/30/2015  . Chronic diastolic CHF (congestive heart failure) ([place] 05/30/2015  . Chest pain with moderate risk of acute coronary syndrome 05/29/2015  . Dyspnea on exertion   . Essential hypertension   . RLS (restless legs syndrome) 04/27/2015  . Lipoma 02/08/2015  . Bipolar disorder ([place] 01/23/2015  . Insomnia 01/23/2015  . Chronic back pain 01/04/2015  . Permanent atrial fibrillation 01/04/2015  . DM2 (diabetes mellitus, type 2) ([place]   . Hyperlipidemia     KStandley Brooking PTA 01/04/2019, 12:14 PM  [facility]MP[address] [place] [address]Phone: [phone]  Fax:  [phone] Name: TBINA VEENSTRAMRN: [id number] of Birth: 1Jan 05, 1947

## 2019-01-04 NOTE — Telephone Encounter (Signed)
What is the name of the medication? SUMAtriptan (IMITREX) 100 MG tablet  Have you contacted your pharmacy to request a refill? Yes but refused because d/c but pt states that she has been on this med for 4 years.  Which pharmacy would you like this sent to? [facility] in [place]   Patient notified that their request is being sent to the clinical staff for review and that they should receive a call once it is complete. If they do not receive a call within 24 hours they can check with their pharmacy or our office.

## 2019-01-04 NOTE — Telephone Encounter (Signed)
Patient is prescribed Maxalt.  This is the same medication class.  Is she still taking the Maxalt?

## 2019-01-05 DIAGNOSIS — N183 Chronic kidney disease, stage 3 (moderate): Secondary | ICD-10-CM | POA: Diagnosis not present

## 2019-01-05 DIAGNOSIS — Z794 Long term (current) use of insulin: Secondary | ICD-10-CM | POA: Diagnosis not present

## 2019-01-05 DIAGNOSIS — E1122 Type 2 diabetes mellitus with diabetic chronic kidney disease: Secondary | ICD-10-CM | POA: Diagnosis not present

## 2019-01-05 DIAGNOSIS — E1165 Type 2 diabetes mellitus with hyperglycemia: Secondary | ICD-10-CM | POA: Diagnosis not present

## 2019-01-05 DIAGNOSIS — E1140 Type 2 diabetes mellitus with diabetic neuropathy, unspecified: Secondary | ICD-10-CM | POA: Diagnosis not present

## 2019-01-05 NOTE — Telephone Encounter (Signed)
Pt aware and she will contact Neuro

## 2019-01-05 NOTE — Telephone Encounter (Signed)
She says she uses maxalt for forst half of month and imitrex for the 2nd half of month??

## 2019-01-05 NOTE — Telephone Encounter (Signed)
I just read Dr Aquino's note from June.  She was supposed to follow up with her regarding migraines.  Please have her give her neurologist a call.  It appears these medications should be routed to her.

## 2019-01-06 ENCOUNTER — Ambulatory Visit: Payer: Medicare Other | Admitting: Physical Therapy

## 2019-01-06 NOTE — H&P (Signed)
Patient's anticipated LOS is less than 2 midnights, meeting these requirements: - Younger than 31 - Lives within 1 hour of care - Has a competent adult at home to recover with post-op recover - NO history of  - Chronic pain requiring opiods  - Diabetes  - Coronary Artery Disease  - Heart failure  - Heart attack  - Stroke  - DVT/VTE  - Cardiac arrhythmia  - Respiratory Failure/COPD  - Renal failure  - Anemia  - Advanced Liver disease       Amber Stephenson is an 73 y.o. female.    Chief Complaint: right shoulder pain/weakness  HPI: Pt is a 73 y.o. female complaining of right shoulder pain for multiple years. Pain had continually increased since the beginning. X-rays in the clinic show end-stage arthritic changes of the right shoulder. Pt has tried various conservative treatments which have failed to alleviate their symptoms, including injections and therapy. Various options are discussed with the patient. Risks, benefits and expectations were discussed with the patient. Patient understand the risks, benefits and expectations and wishes to proceed with surgery.   PCP:  [name], DO  D/C Plans: Home  PMH: Past Medical History:  Diagnosis Date  . Atrial fibrillation ([place])   . Bipolar affective ([place])   . CHF (congestive heart failure) ([place])   . Chronic back pain   . Diabetes mellitus without complication ([place])   . Hyperlipidemia   . Hypertension   . Morbid obesity due to excess calories ([place]) 07/06/2018  . Pain management   . Renal insufficiency     PSH: Past Surgical History:  Procedure Laterality Date  . BREAST REDUCTION SURGERY    . EYE SURGERY Right    cateracts  . HAMMER TOE SURGERY    . LEFT HEART CATH AND CORONARY ANGIOGRAPHY N/A 02/03/2018   Procedure: LEFT HEART CATH AND CORONARY ANGIOGRAPHY;  Surgeon: Martinique, Peter M, MD;  Location: [facility];  Service: Cardiovascular;  Laterality: N/A;  . SHOULDER SURGERY Right   . THIGH SURGERY      Social History:  reports that she has never smoked. She has never used smokeless tobacco. She reports that she does not drink alcohol or use drugs.  Allergies:  Allergies  Allergen Reactions  . Ivp Dye [Iodinated Diagnostic Agents] Anaphylaxis and Swelling    Throat closes  . Latex     Pulls skin with it    Medications: No current facility-administered medications for this encounter.    Current Outpatient Medications  Medication Sig Dispense Refill  . albuterol (PROVENTIL) (2.5 MG/3ML) 0.083% nebulizer solution Take 3 mLs (2.5 mg total) by nebulization every 6 (six) hours as needed for wheezing or shortness of breath. 75 mL 1  . albuterol (VENTOLIN HFA) 108 (90 Base) MCG/ACT inhaler Inhale 2 puffs into the lungs every 6 (six) hours as needed for wheezing or shortness of breath. 1 Inhaler 6  . apixaban (ELIQUIS) 5 MG TABS tablet Take 1 tablet (5 mg total) by mouth 2 (two) times daily. 60 tablet 0  . ARIPiprazole (ABILIFY) 10 MG tablet Take 3 tablets (30 mg total) by mouth daily. 90 tablet 4  . atorvastatin (LIPITOR) 40 MG tablet TAKE 1 TABLET DAILY 90 tablet 0  . Blood Glucose Monitoring Suppl (FREESTYLE LITE) DEVI Use to check blood sugar tid. DX E11.22 1 each 0  . budesonide (ENTOCORT EC) 3 MG 24 hr capsule Take 9 mg by mouth every morning.    . budesonide-formoterol (SYMBICORT)  80-4.5 MCG/ACT inhaler Inhale 2 puffs into the lungs 2 (two) times daily. 2 Inhaler 0  . carbamazepine (CARBATROL) 100 MG 12 hr capsule Take 1 capsule (100 mg total) by mouth daily. 90 capsule 1  . Cholecalciferol (VITAMIN D3) 1000 UNITS CAPS Take 1,000 Units by mouth daily.    Marland Kitchen diltiazem (CARDIZEM CD) 120 MG 24 hr capsule TAKE 1 CAPSULE AT BEDTIME 90 capsule 3  . Dulaglutide (TRULICITY) 1.5 GL/8.7FI SOPN Inject 1.5 mg into the skin once a week. 12 pen 3  . Eszopiclone (ESZOPICLONE) 3 MG TABS Take 1 tablet (3 mg total) by mouth at bedtime as needed. Take immediately before bedtime 90 tablet 1  . furosemide  (LASIX) 20 MG tablet Take 1 tablet (20 mg total) by mouth 2 (two) times daily. 180 tablet 1  . glucose blood (FREESTYLE LITE) test strip USE FOR TESTING SUGAR THREE TIMES DAILY 300 each 3  . Icosapent Ethyl (VASCEPA) 1 g CAPS Take 2 capsules (2 g total) by mouth 2 (two) times daily with a meal. 360 capsule 0  . Insulin Glargine (LANTUS SOLOSTAR) 100 UNIT/ML Solostar Pen INJECT 60 UNITS UNDER THE SKIN TWICE A DAY (Patient taking differently: Inject 60 Units into the skin 2 (two) times daily. ) 90 mL 0  . Insulin Pen Needle (SURE COMFORT PEN NEEDLES) 32G X 4 MM MISC Use with insulin pens as directed. Dx E11.9 300 each 11  . Lancets (FREESTYLE) lancets Test tid. DX E11.22 100 each 5  . LORazepam (ATIVAN) 1 MG tablet Take 1 tab (1 mg) in the morning and 2 tabs (2 mg ) at bedtime 90 tablet 4  . metoprolol tartrate (LOPRESSOR) 100 MG tablet TAKE 1 TABLET TWICE A DAY WITH METOPROLOL 25 MG TO EQUAL 125 MG 180 tablet 3  . metoprolol tartrate (LOPRESSOR) 25 MG tablet Take 1 tablet (25 mg total) by mouth 2 (two) times daily. Take with metoprolol 100 mg to equal 125 mg 28 tablet 0  . naloxone (NARCAN) nasal spray 4 mg/0.1 mL Place 1 spray into the nose once as needed (opiod overdose).    Marland Kitchen nystatin (MYCOSTATIN/NYSTOP) powder Apply topically 3 (three) times daily. x2 weeks (for groin/breast rash) 60 g 1  . pregabalin (LYRICA) 150 MG capsule Take 1 capsule (150 mg total) by mouth 2 (two) times daily. 180 capsule 4  . rizatriptan (MAXALT-MLT) 10 MG disintegrating tablet DISSOLVE 1 TABLET ON THE TONGUE AT FIRST SIGN OF HEADACHE. MAY REPEAT ONE DOSE IN 1 HOUR IF NEEDED 10 tablet 2  . tizanidine (ZANAFLEX) 2 MG capsule Take 1 capsule (2 mg total) by mouth 3 (three) times daily as needed for muscle spasms. 30 capsule 0  . topiramate (TOPAMAX) 25 MG tablet 1 tablet in the morning and 3 tablets every night 120 tablet 11  . traZODone (DESYREL) 100 MG tablet 4  qhs 360 tablet 1    No results found for this or any  previous visit (from the past 48 hour(s)). No results found.  ROS: Pain with rom of the right upper extremity  Physical Exam: Alert and oriented 73 y.o. female in no acute distress Cranial nerves 2-12 intact Cervical spine: full rom with no tenderness, nv intact distally Chest: active breath sounds bilaterally, no wheeze rhonchi or rales Heart: regular rate and rhythm, no murmur Abd: non tender non distended with active bowel sounds Hip is stable with rom  Right shoulder limited rom  nv intact distally No rashes or edema  Assessment/Plan Assessment: right shoulder  cuff arthropathy  Plan:  Patient will undergo a right reverse total shoulder by Dr. Veverly Fells at [facility]. Risks benefits and expectations were discussed with the patient. Patient understand risks, benefits and expectations and wishes to proceed. Preoperative templating of the joint replacement has been completed, documented, and submitted to the Operating Room personnel in order to optimize intra-operative equipment management.   Merla Riches PA-C, MPAS [facility] is now [employer] [address], [place], [place], [place] [address] Phone: [phone] [email or web address] Facebook  [employer]

## 2019-01-12 ENCOUNTER — Telehealth: Payer: Self-pay | Admitting: *Deleted

## 2019-01-12 ENCOUNTER — Telehealth: Payer: Self-pay | Admitting: Family Medicine

## 2019-01-12 NOTE — Telephone Encounter (Signed)
Unfortunately yes I am currently closed to any changing of patients or new patients.  There are other providers in the office that are accepting patients if she feels like she cannot continue with Dr. Lajuana Ripple including [facility] Ovens and Monia Pouch but the rest of our office is currently closed to taking any new patients

## 2019-01-12 NOTE — Telephone Encounter (Signed)
This is regretful to hear.  I will pass this onto Dr D and inform him of current care plan.

## 2019-01-12 NOTE — Telephone Encounter (Signed)
Spoke with pt regarding Prolia Pt is having reverse shoulder arthroscopy surgery on 01/21/2019 Prolia inj deferred until pt is cleared by Ortho Pt notified and verbalizes understanding

## 2019-01-12 NOTE — Telephone Encounter (Signed)
I spoke with Amber Stephenson about her decision to change PCP's.  I informed her that Dr. Warrick Parisian is not accepting new patients at this time and she will call back and decide what she will do.

## 2019-01-13 NOTE — Telephone Encounter (Signed)
Patient aware Dr. Warrick Parisian is not taking new patient's and will call back.

## 2019-01-17 NOTE — Patient Instructions (Addendum)
YOU NEED TO HAVE A COVID 19 TEST ON___TODAY , AUGUST 18TH____ @__3 :00PM_____, THIS TEST MUST BE DONE BEFORE SURGERY, COME  [address] [place] , [address]. ONCE YOUR COVID TEST IS COMPLETED, PLEASE BEGIN THE QUARANTINE INSTRUCTIONS AS OUTLINED IN YOUR HANDOUT.                Amber Stephenson    Your procedure is scheduled on: 01-21-2019   Report to [facility] Main  Entrance    Report to admitting at 7:30 AM   1 VISITOR IS ALLOWED TO WAIT IN WAITING ROOM  ONLY DAY OF YOUR SURGERY. NO VISITORS ARE ALLOWED IN SHORT STAY OR RECOVERY ROOM.   Call this number if you have problems the morning of surgery [phone]    Remember: [place], NO CHEWING GUM CANDY OR MINTS.    NO SOLID FOOD AFTER MIDNIGHT THE NIGHT PRIOR TO SURGERY. NOTHING BY MOUTH EXCEPT CLEAR LIQUIDS UNTIL  7:00 AM. . PLEASE FINISH G2 GATORADE  DRINK PER SURGEON ORDER  WHICH NEEDS TO BE COMPLETED AT  7:00 AM.   CLEAR LIQUID DIET   Foods Allowed                                                                     Foods Excluded  Coffee and tea, regular and decaf                             liquids that you cannot  Plain Jell-O any favor except red or purple                                           see through such as: Fruit ices (not with fruit pulp)                                     milk, soups, orange juice  Iced Popsicles                                    All solid food Carbonated beverages, regular and diet                                    Cranberry, grape and apple juices Sports drinks like Gatorade Lightly seasoned clear broth or consume(fat free) Sugar, honey syrup  Sample Menu Breakfast                                Lunch                                     Supper Cranberry juice  Beef broth                            Chicken broth Jell-O                                     Grape juice                            Apple juice Coffee or tea                        Jell-O                                      Popsicle                                                Coffee or tea                        Coffee or tea  _____________________________________________________________________     Take these medicines the morning of surgery with A SIP OF WATER:  CARBAMAZEPINE, METOPROLOL, TOPIRAMATE, RIZATRIPTAN, OXYCODONE IF NEEDED, ALBUTEROL INHALER IF NEEDED, SYMBICORT INHALER IF NEEDED, NEBULIZER IF NEEDED      How to Manage Your Diabetes Before and After Surgery  Why is it important to control my blood sugar before and after surgery? . Improving blood sugar levels before and after surgery helps healing and can limit problems. . A way of improving blood sugar control is eating a healthy diet by: o  Eating less sugar and carbohydrates o  Increasing activity/exercise o  Talking with your doctor about reaching your blood sugar goals . High blood sugars (greater than 180 mg/dL) can raise your risk of infections and slow your recovery, so you will need to focus on controlling your diabetes during the weeks before surgery. . Make sure that the doctor who takes care of your diabetes knows about your planned surgery including the date and location.  How do I manage my blood sugar before surgery? . Check your blood sugar at least 4 times a day, starting 2 days before surgery, to make sure that the level is not too high or low. o Check your blood sugar the morning of your surgery when you wake up and every 2 hours until you get to the Short Stay unit. . If your blood sugar is less than 70 mg/dL, you will need to treat for low blood sugar: o Do not take insulin. o Treat a low blood sugar (less than 70 mg/dL) with  cup of clear juice (cranberry or apple), 4 glucose tablets, OR glucose gel. o Recheck blood sugar in 15 minutes after treatment (to make sure it is greater than 70 mg/dL). If your blood sugar is not  greater than 70 mg/dL on recheck, call [phone] for further instructions. . Report your blood sugar to the short stay nurse when you get to Short Stay.  . If you are admitted to the hospital after surgery: o Your blood sugar will be checked by the staff and you  will probably be given insulin after surgery (instead of oral diabetes medicines) to make sure you have good blood sugar levels. o The goal for blood sugar control after surgery is 80-180 mg/dL.   WHAT DO I DO ABOUT MY DIABETES MEDICATION?   THE DAY BEFORE SURGERY :TAKE YOUR NORMAL DOSE OF LANTUS INSULIN  IN THE MORNING, TAKE HALF YOUR NORMAL DOSE OF LANTUS INSULIN  IN THE EVENING ( 30 UNITS)    THE MORNING OF SURGERY : TAKE HALF YOUR NORMAL DOSE OF LANTUS INSULIN  (TAKE 30 UNITS).               You may not have any metal on your body including hair pins and              piercings  Do not wear jewelry, make-up, lotions, powders or perfumes, deodorant             Do not wear nail polish.  Do not shave  48 hours prior to surgery.               Do not bring valuables to the hospital. [place].  Contacts, dentures or bridgework may not be worn into surgery.  Leave suitcase in the car. After surgery it may be brought to your room.      _____________________________________________________________________             [facility] - Preparing for Surgery Before surgery, you can play an important role.  Because skin is not sterile, your skin needs to be as free of germs as possible.  You can reduce the number of germs on your skin by washing with CHG (chlorahexidine gluconate) soap before surgery.  CHG is an antiseptic cleaner which kills germs and bonds with the skin to continue killing germs even after washing. Please DO NOT use if you have an allergy to CHG or antibacterial soaps.  If your skin becomes reddened/irritated stop using the CHG and inform your nurse when you arrive  at Short Stay. Do not shave (including legs and underarms) for at least 48 hours prior to the first CHG shower.  You may shave your face/neck. Please follow these instructions carefully:  1.  Shower with CHG Soap the night before surgery and the  morning of Surgery.  2.  If you choose to wash your hair, wash your hair first as usual with your  normal  shampoo.  3.  After you shampoo, rinse your hair and body thoroughly to remove the  shampoo.                           4.  Use CHG as you would any other liquid soap.  You can apply chg directly  to the skin and wash                       Gently with a scrungie or clean washcloth.  5.  Apply the CHG Soap to your body ONLY FROM THE NECK DOWN.   Do not use on face/ open                           Wound or open sores. Avoid contact with eyes, ears mouth and genitals (private parts).  Wash face,  Genitals (private parts) with your normal soap.             6.  Wash thoroughly, paying special attention to the area where your surgery  will be performed.  7.  Thoroughly rinse your body with warm water from the neck down.  8.  DO NOT shower/wash with your normal soap after using and rinsing off  the CHG Soap.                9.  Pat yourself dry with a clean towel.            10.  Wear clean pajamas.            11.  Place clean sheets on your bed the night of your first shower and do not  sleep with pets. Day of Surgery : Do not apply any lotions/deodorants the morning of surgery.  Please wear clean clothes to the hospital/surgery center.  FAILURE TO FOLLOW THESE INSTRUCTIONS MAY RESULT IN THE CANCELLATION OF YOUR SURGERY PATIENT SIGNATURE_________________________________  NURSE SIGNATURE__________________________________  ________________________________________________________________________   Amber Stephenson  An incentive spirometer is a tool that can help keep your lungs clear and active. This tool measures how well you  are filling your lungs with each breath. Taking long deep breaths may help reverse or decrease the chance of developing breathing (pulmonary) problems (especially infection) following:  A long period of time when you are unable to move or be active. BEFORE THE PROCEDURE   If the spirometer includes an indicator to show your best effort, your nurse or respiratory therapist will set it to a desired goal.  If possible, sit up straight or lean slightly forward. Try not to slouch.  Hold the incentive spirometer in an upright position. INSTRUCTIONS FOR USE  1. Sit on the edge of your bed if possible, or sit up as far as you can in bed or on a chair. 2. Hold the incentive spirometer in an upright position. 3. Breathe out normally. 4. Place the mouthpiece in your mouth and seal your lips tightly around it. 5. Breathe in slowly and as deeply as possible, raising the piston or the ball toward the top of the column. 6. Hold your breath for 3-5 seconds or for as long as possible. Allow the piston or ball to fall to the bottom of the column. 7. Remove the mouthpiece from your mouth and breathe out normally. 8. Rest for a few seconds and repeat Steps 1 through 7 at least 10 times every 1-2 hours when you are awake. Take your time and take a few normal breaths between deep breaths. 9. The spirometer may include an indicator to show your best effort. Use the indicator as a goal to work toward during each repetition. 10. After each set of 10 deep breaths, practice coughing to be sure your lungs are clear. If you have an incision (the cut made at the time of surgery), support your incision when coughing by placing a pillow or rolled up towels firmly against it. Once you are able to get out of bed, walk around indoors and cough well. You may stop using the incentive spirometer when instructed by your caregiver.  RISKS AND COMPLICATIONS  Take your time so you do not get dizzy or light-headed.  If you are in  pain, you may need to take or ask for pain medication before doing incentive spirometry. It is harder to take a deep breath if you are having  pain. AFTER USE  Rest and breathe slowly and easily.  It can be helpful to keep track of a log of your progress. Your caregiver can provide you with a simple table to help with this. If you are using the spirometer at home, follow these instructions: [place] IF:   You are having difficultly using the spirometer.  You have trouble using the spirometer as often as instructed.  Your pain medication is not giving enough relief while using the spirometer.  You develop fever of 100.5 F (38.1 C) or higher. SEEK IMMEDIATE MEDICAL CARE IF:   You cough up bloody sputum that had not been present before.  You develop fever of 102 F (38.9 C) or greater.  You develop worsening pain at or near the incision site. MAKE SURE YOU:   Understand these instructions.  Will watch your condition.  Will get help right away if you are not doing well or get worse. Document Released: 09/29/2006 Document Revised: 08/11/2011 Document Reviewed: 11/30/2006 [facility] Patient Information 2014 [place], [place].   ________________________________________________________________________

## 2019-01-17 NOTE — Progress Notes (Signed)
[facility]OV CARDIOLOGY DR Bronson Ing 03-03-18 Epic EKG 02-03-18 Epic CHEST 1 VIEW XRAY AND CHEST CT 03-06-18 EPIC

## 2019-01-18 ENCOUNTER — Encounter (HOSPITAL_COMMUNITY)
Admission: RE | Admit: 2019-01-18 | Discharge: 2019-01-18 | Disposition: A | Discharge status: Home / Self Care | Payer: Medicare Other | Source: Ambulatory Visit | Attending: Orthopedic Surgery | Admitting: Orthopedic Surgery

## 2019-01-18 ENCOUNTER — Inpatient Hospital Stay (HOSPITAL_COMMUNITY): Payer: Medicare Other | Admitting: Registered Nurse

## 2019-01-18 ENCOUNTER — Inpatient Hospital Stay (HOSPITAL_COMMUNITY): Admission: RE | Admit: 2019-01-18 | Payer: Medicare Other | Source: Ambulatory Visit

## 2019-01-18 ENCOUNTER — Other Ambulatory Visit: Payer: Self-pay

## 2019-01-18 ENCOUNTER — Encounter (HOSPITAL_COMMUNITY): Payer: Self-pay

## 2019-01-18 ENCOUNTER — Inpatient Hospital Stay (HOSPITAL_COMMUNITY): Payer: Medicare Other | Admitting: Physician Assistant

## 2019-01-18 DIAGNOSIS — Z794 Long term (current) use of insulin: Secondary | ICD-10-CM | POA: Insufficient documentation

## 2019-01-18 DIAGNOSIS — N289 Disorder of kidney and ureter, unspecified: Secondary | ICD-10-CM | POA: Insufficient documentation

## 2019-01-18 DIAGNOSIS — Z01818 Encounter for other preprocedural examination: Secondary | ICD-10-CM | POA: Insufficient documentation

## 2019-01-18 DIAGNOSIS — J45909 Unspecified asthma, uncomplicated: Secondary | ICD-10-CM | POA: Insufficient documentation

## 2019-01-18 DIAGNOSIS — I5032 Chronic diastolic (congestive) heart failure: Secondary | ICD-10-CM | POA: Insufficient documentation

## 2019-01-18 DIAGNOSIS — I110 Hypertensive heart disease with heart failure: Secondary | ICD-10-CM | POA: Insufficient documentation

## 2019-01-18 DIAGNOSIS — Z20828 Contact with and (suspected) exposure to other viral communicable diseases: Secondary | ICD-10-CM | POA: Insufficient documentation

## 2019-01-18 DIAGNOSIS — Z79899 Other long term (current) drug therapy: Secondary | ICD-10-CM | POA: Insufficient documentation

## 2019-01-18 DIAGNOSIS — F319 Bipolar disorder, unspecified: Secondary | ICD-10-CM | POA: Insufficient documentation

## 2019-01-18 DIAGNOSIS — E785 Hyperlipidemia, unspecified: Secondary | ICD-10-CM | POA: Insufficient documentation

## 2019-01-18 DIAGNOSIS — I2510 Atherosclerotic heart disease of native coronary artery without angina pectoris: Secondary | ICD-10-CM | POA: Insufficient documentation

## 2019-01-18 DIAGNOSIS — Z7901 Long term (current) use of anticoagulants: Secondary | ICD-10-CM | POA: Insufficient documentation

## 2019-01-18 DIAGNOSIS — I4891 Unspecified atrial fibrillation: Secondary | ICD-10-CM | POA: Insufficient documentation

## 2019-01-18 DIAGNOSIS — Z7951 Long term (current) use of inhaled steroids: Secondary | ICD-10-CM | POA: Insufficient documentation

## 2019-01-18 DIAGNOSIS — E119 Type 2 diabetes mellitus without complications: Secondary | ICD-10-CM | POA: Insufficient documentation

## 2019-01-18 DIAGNOSIS — I272 Pulmonary hypertension, unspecified: Secondary | ICD-10-CM | POA: Insufficient documentation

## 2019-01-18 DIAGNOSIS — M19011 Primary osteoarthritis, right shoulder: Secondary | ICD-10-CM | POA: Insufficient documentation

## 2019-01-18 HISTORY — DX: Pulmonary hypertension, unspecified: I27.20

## 2019-01-18 HISTORY — DX: Atherosclerotic heart disease of native coronary artery without angina pectoris: I25.10

## 2019-01-18 LAB — SURGICAL PCR SCREEN
MRSA, PCR: NEGATIVE
Staphylococcus aureus: NEGATIVE

## 2019-01-18 LAB — CBC
HCT: 44.9 % (ref 36.0–46.0)
Hemoglobin: 14.5 g/dL (ref 12.0–15.0)
MCH: 30.1 pg (ref 26.0–34.0)
MCHC: 32.3 g/dL (ref 30.0–36.0)
MCV: 93.2 fL (ref 80.0–100.0)
Platelets: 181 10*3/uL (ref 150–400)
RBC: 4.82 MIL/uL (ref 3.87–5.11)
RDW: 13.3 % (ref 11.5–15.5)
WBC: 10.5 10*3/uL (ref 4.0–10.5)
nRBC: 0 % (ref 0.0–0.2)

## 2019-01-18 LAB — BASIC METABOLIC PANEL
Anion gap: 7 (ref 5–15)
BUN: 18 mg/dL (ref 8–23)
CO2: 27 mmol/L (ref 22–32)
Calcium: 9.3 mg/dL (ref 8.9–10.3)
Chloride: 107 mmol/L (ref 98–111)
Creatinine, Ser: 0.88 mg/dL (ref 0.44–1.00)
GFR calc Af Amer: >60 mL/min (ref >60)
GFR calc non Af Amer: >60 mL/min (ref >60)
Glucose, Bld: 216 mg/dL — ABNORMAL HIGH (ref 70–99)
Potassium: 4.3 mmol/L (ref 3.5–5.1)
Sodium: 141 mmol/L (ref 135–145)

## 2019-01-18 LAB — HEMOGLOBIN A1C
Hgb A1c MFr Bld: 7.7 % — ABNORMAL HIGH (ref 4.8–5.6)
Mean Plasma Glucose: 174.29 mg/dL

## 2019-01-18 LAB — GLUCOSE, CAPILLARY: Glucose-Capillary: 218 mg/dL — ABNORMAL HIGH (ref 70–99)

## 2019-01-18 NOTE — Progress Notes (Signed)
Patient on Eliquis for hx of Afib. Patient reports that she has not been given any instructions for her Eliquis in regards to upcoming surgery . RN made APP [facility] aware. APP to f/u with Dr Veverly Fells office. RN also encouraged patient to reach out to Dr Veverly Fells office. Patient asked for Veverly Fells' office number. RN provided this

## 2019-01-18 NOTE — Progress Notes (Signed)
Patient at  pre-op appt c/o dizziness upon standing to walk to lab for blood draw. cbg 218 . Vitals WDL. Patient denies nausea nor palpitations. Patient agreed to drink some ice water. Peanut butter crackers given to patient and enocuraged to eat. Patient agreed to take but says " ill have it later". Patient reports that this dizziness occurs at times at home and states " I just don't understand my blood sugar why it will be high and I feel like this , no one has ever really explained it to me". RN advised patient to contact her PCP or endocrinologist with this complaint and advised patient that it will be helpful to keep a track of her blood sugar readings at home and report this to her physician. Patient verbalized understanding. Patient tolerated blood draw well but dizziness persisted. Patient agreed to use of a wheelchair. Patient taken by this RN to front entrance to meet patient spouse for pick-up. RN waited with patient for nearly 15 minutes while her spouse went to get their car . Patient now appeared very vibrant and chatty during wait and states " I feel much better now". Patient spouse arrived to front entrance and assisted RN with loading patient in passenger seat of their vehicle. Patient thanked Therapist, sports for the help . RN reminded patient to f/u with her physician about dizziness and stay hydrated. Patient drove away with spouse.

## 2019-01-19 ENCOUNTER — Other Ambulatory Visit (HOSPITAL_COMMUNITY)
Admission: RE | Admit: 2019-01-19 | Discharge: 2019-01-19 | Disposition: A | Discharge status: Home / Self Care | Payer: Medicare Other | Source: Ambulatory Visit | Attending: Orthopedic Surgery | Admitting: Orthopedic Surgery

## 2019-01-19 LAB — SARS CORONAVIRUS 2 (TAT 6-24 HRS): SARS Coronavirus 2: NEGATIVE

## 2019-01-19 NOTE — Progress Notes (Signed)
Anesthesia Chart Review   Case: [id number] Date/Time: 01/21/19 0951   Procedure: REVERSE SHOULDER ARTHROPLASTY (Right ) - intersclene block   Anesthesia type: General   Pre-op diagnosis: Right shoulder osteoarthritis   Location: [facility] ROOM 06 / [facility]   Surgeon: Netta Cedars, MD      DISCUSSION:73 y.o. never smoker with h/o DM II, HTN, A-fib, renal insufficiency, pulmonary HTN, HLD, CHF, nonobstructive CAD, asthma, bipolar affective, right shoulder OA scheduled for above procedure 01/21/2019 with Dr. Netta Cedars.   Pt last seen by cardiologist, Dr. Kate Sable, 08/27/2018.  Stable at this visit. Per OV note, "Preoperative risk stratification: I informed the patient that her orthopedic surgeon's office can fax [place] a form prior to surgery being scheduled."  Cardiac clearance requested, pending.    Pt reports last dose of Eliquis 01/17/2019 per cardiology.    VS: BP 129/72   Pulse 75   Temp 36.6 C (Oral)   Resp 14   Ht 5\' 2"  (1.575 m)   Wt 91.6 kg   SpO2 95%   BMI 36.95 kg/m   PROVIDERS: [name], DO is PCP   Kate Sable, MD is Cardiologist  LABS: Labs reviewed: Acceptable for surgery. (all labs ordered are listed, but only abnormal results are displayed)  Labs Reviewed  HEMOGLOBIN A1C - Abnormal; Notable for the following components:      Result Value   Hgb A1c MFr Bld 7.7 (*)    All other components within normal limits  BASIC METABOLIC PANEL - Abnormal; Notable for the following components:   Glucose, Bld 216 (*)    All other components within normal limits  GLUCOSE, CAPILLARY - Abnormal; Notable for the following components:   Glucose-Capillary 218 (*)    All other components within normal limits  SURGICAL PCR SCREEN  CBC     IMAGES:   EKG: 02/03/18 Rate 75 bpm Atrial fibrillation  Septal infarct, age undetermined ST & T wave abnormality, consider lateral ischemia Abnormal ECG Similar to prior tracing   CV: Cardiac Cath 02/03/18  Prox LAD  lesion is 40% stenosed.  Mid LAD lesion is 30% stenosed.  Ost 1st Mrg to 1st Mrg lesion is 30% stenosed.  LV end diastolic pressure is moderately elevated.   1. Nonobstructive CAD with left dominant circulation 2. Moderately elevated LVEDP (post hydration)  Echo 02/03/2018 Study Conclusions  - Left ventricle: The cavity size was normal. Wall thickness was   normal. Systolic function was normal. The estimated ejection   fraction was in the range of 60% to 65%. Wall motion was normal;   there were no regional wall motion abnormalities. - Aortic valve: There was trivial regurgitation. - Left atrium: The atrium was severely dilated. - Right atrium: The atrium was moderately to severely dilated. - Pulmonary arteries: Systolic pressure was moderately increased.   PA peak pressure: 46 mm Hg (S). Past Medical History:  Diagnosis Date  . Atrial fibrillation (HCC)    PERMANENT  . Bipolar affective ([place])   . CAD (coronary artery disease)    NONOBSTRUCTIVE CAD; MGD BY DR. Bronson Ing   . CHF (congestive heart failure) (HCC)    DIASTOLIC  . Chronic back pain    LOWER   . Diabetes mellitus without complication ([place])    TYPE 2   . Hyperlipidemia   . Hypertension   . Morbid obesity due to excess calories ([place]) 07/06/2018  . Pain management   . Pulmonary HTN ([place])   . Renal insufficiency  DR [facility]      Past Surgical History:  Procedure Laterality Date  . BREAST REDUCTION SURGERY    . EYE SURGERY Right    cateracts  . HAMMER TOE SURGERY    . LEFT HEART CATH AND CORONARY ANGIOGRAPHY N/A 02/03/2018   Procedure: LEFT HEART CATH AND CORONARY ANGIOGRAPHY;  Surgeon: Martinique, Peter M, MD;  Location: [place] CV LAB;  Service: Cardiovascular;  Laterality: N/A;  . SHOULDER SURGERY Right    "I BROKE MY SHOUDLER  . THIGH SURGERY     "TO REMOVE A TUMOR "    MEDICATIONS: . albuterol (PROVENTIL) (2.5 MG/3ML) 0.083% nebulizer solution  . albuterol (VENTOLIN HFA) 108 (90 Base) MCG/ACT  inhaler  . apixaban (ELIQUIS) 5 MG TABS tablet  . ARIPiprazole (ABILIFY) 10 MG tablet  . atorvastatin (LIPITOR) 40 MG tablet  . Blood Glucose Monitoring Suppl (FREESTYLE LITE) DEVI  . budesonide-formoterol (SYMBICORT) 80-4.5 MCG/ACT inhaler  . carbamazepine (CARBATROL) 100 MG 12 hr capsule  . Cholecalciferol (VITAMIN D3) 1000 UNITS CAPS  . diltiazem (CARDIZEM CD) 120 MG 24 hr capsule  . Dulaglutide (TRULICITY) 1.5 MA/2.6JF SOPN  . Eszopiclone (ESZOPICLONE) 3 MG TABS  . furosemide (LASIX) 20 MG tablet  . glucose blood (FREESTYLE LITE) test strip  . Icosapent Ethyl (VASCEPA) 1 g CAPS  . Insulin Glargine (LANTUS SOLOSTAR) 100 UNIT/ML Solostar Pen  . Insulin Pen Needle (SURE COMFORT PEN NEEDLES) 32G X 4 MM MISC  . Lancets (FREESTYLE) lancets  . LORazepam (ATIVAN) 1 MG tablet  . metoprolol tartrate (LOPRESSOR) 100 MG tablet  . metoprolol tartrate (LOPRESSOR) 25 MG tablet  . naloxone (NARCAN) nasal spray 4 mg/0.1 mL  . nystatin (MYCOSTATIN/NYSTOP) powder  . oxyCODONE-acetaminophen (PERCOCET/ROXICET) 5-325 MG tablet  . pregabalin (LYRICA) 150 MG capsule  . rizatriptan (MAXALT-MLT) 10 MG disintegrating tablet  . tizanidine (ZANAFLEX) 2 MG capsule  . topiramate (TOPAMAX) 25 MG tablet  . traZODone (DESYREL) 100 MG tablet   No current facility-administered medications for this encounter.    Maia Plan [facility] Pre-Surgical Testing [phone] 01/19/19  4:47 PM

## 2019-01-21 ENCOUNTER — Inpatient Hospital Stay (HOSPITAL_COMMUNITY)
Admission: EM | Admit: 2019-01-21 | Discharge: 2019-01-23 | DRG: 291 | Disposition: A | Discharge status: Home / Self Care | Payer: Medicare Other | Attending: Internal Medicine | Admitting: Internal Medicine

## 2019-01-21 ENCOUNTER — Encounter (HOSPITAL_COMMUNITY): Payer: Self-pay

## 2019-01-21 ENCOUNTER — Emergency Department (HOSPITAL_COMMUNITY): Payer: Medicare Other

## 2019-01-21 ENCOUNTER — Encounter (HOSPITAL_COMMUNITY)
Admission: RE | Disposition: A | Discharge status: Home / Self Care | Payer: Self-pay | Source: Home / Self Care | Attending: Orthopedic Surgery

## 2019-01-21 ENCOUNTER — Other Ambulatory Visit: Payer: Self-pay

## 2019-01-21 ENCOUNTER — Ambulatory Visit (HOSPITAL_COMMUNITY)
Admission: RE | Admit: 2019-01-21 | Discharge: 2019-01-21 | Disposition: A | Discharge status: Home / Self Care | Payer: Medicare Other | Source: Home / Self Care | Attending: Orthopedic Surgery | Admitting: Orthopedic Surgery

## 2019-01-21 ENCOUNTER — Inpatient Hospital Stay (HOSPITAL_COMMUNITY): Payer: Medicare Other

## 2019-01-21 ENCOUNTER — Encounter (HOSPITAL_COMMUNITY): Payer: Self-pay | Admitting: *Deleted

## 2019-01-21 DIAGNOSIS — I5033 Acute on chronic diastolic (congestive) heart failure: Secondary | ICD-10-CM | POA: Diagnosis present

## 2019-01-21 DIAGNOSIS — M797 Fibromyalgia: Secondary | ICD-10-CM | POA: Diagnosis present

## 2019-01-21 DIAGNOSIS — J9811 Atelectasis: Secondary | ICD-10-CM | POA: Diagnosis present

## 2019-01-21 DIAGNOSIS — Z09 Encounter for follow-up examination after completed treatment for conditions other than malignant neoplasm: Secondary | ICD-10-CM

## 2019-01-21 DIAGNOSIS — Z7901 Long term (current) use of anticoagulants: Secondary | ICD-10-CM

## 2019-01-21 DIAGNOSIS — Z833 Family history of diabetes mellitus: Secondary | ICD-10-CM

## 2019-01-21 DIAGNOSIS — J810 Acute pulmonary edema: Secondary | ICD-10-CM | POA: Diagnosis not present

## 2019-01-21 DIAGNOSIS — I482 Chronic atrial fibrillation, unspecified: Secondary | ICD-10-CM | POA: Diagnosis not present

## 2019-01-21 DIAGNOSIS — E119 Type 2 diabetes mellitus without complications: Secondary | ICD-10-CM | POA: Diagnosis present

## 2019-01-21 DIAGNOSIS — I272 Pulmonary hypertension, unspecified: Secondary | ICD-10-CM | POA: Diagnosis present

## 2019-01-21 DIAGNOSIS — Z811 Family history of alcohol abuse and dependence: Secondary | ICD-10-CM

## 2019-01-21 DIAGNOSIS — Z6836 Body mass index (BMI) 36.0-36.9, adult: Secondary | ICD-10-CM

## 2019-01-21 DIAGNOSIS — Z20828 Contact with and (suspected) exposure to other viral communicable diseases: Secondary | ICD-10-CM | POA: Diagnosis present

## 2019-01-21 DIAGNOSIS — R0902 Hypoxemia: Secondary | ICD-10-CM | POA: Diagnosis not present

## 2019-01-21 DIAGNOSIS — I2510 Atherosclerotic heart disease of native coronary artery without angina pectoris: Secondary | ICD-10-CM | POA: Diagnosis present

## 2019-01-21 DIAGNOSIS — R0602 Shortness of breath: Secondary | ICD-10-CM

## 2019-01-21 DIAGNOSIS — F419 Anxiety disorder, unspecified: Secondary | ICD-10-CM | POA: Diagnosis present

## 2019-01-21 DIAGNOSIS — I110 Hypertensive heart disease with heart failure: Secondary | ICD-10-CM | POA: Diagnosis not present

## 2019-01-21 DIAGNOSIS — Z539 Procedure and treatment not carried out, unspecified reason: Secondary | ICD-10-CM | POA: Diagnosis present

## 2019-01-21 DIAGNOSIS — Z818 Family history of other mental and behavioral disorders: Secondary | ICD-10-CM

## 2019-01-21 DIAGNOSIS — Z79899 Other long term (current) drug therapy: Secondary | ICD-10-CM

## 2019-01-21 DIAGNOSIS — Z91040 Latex allergy status: Secondary | ICD-10-CM

## 2019-01-21 DIAGNOSIS — E1140 Type 2 diabetes mellitus with diabetic neuropathy, unspecified: Secondary | ICD-10-CM | POA: Diagnosis present

## 2019-01-21 DIAGNOSIS — J811 Chronic pulmonary edema: Secondary | ICD-10-CM | POA: Diagnosis present

## 2019-01-21 DIAGNOSIS — M810 Age-related osteoporosis without current pathological fracture: Secondary | ICD-10-CM | POA: Diagnosis present

## 2019-01-21 DIAGNOSIS — G43909 Migraine, unspecified, not intractable, without status migrainosus: Secondary | ICD-10-CM | POA: Diagnosis present

## 2019-01-21 DIAGNOSIS — G8929 Other chronic pain: Secondary | ICD-10-CM | POA: Diagnosis present

## 2019-01-21 DIAGNOSIS — M549 Dorsalgia, unspecified: Secondary | ICD-10-CM | POA: Diagnosis present

## 2019-01-21 DIAGNOSIS — Z79891 Long term (current) use of opiate analgesic: Secondary | ICD-10-CM

## 2019-01-21 DIAGNOSIS — G2581 Restless legs syndrome: Secondary | ICD-10-CM | POA: Diagnosis present

## 2019-01-21 DIAGNOSIS — M19011 Primary osteoarthritis, right shoulder: Secondary | ICD-10-CM | POA: Diagnosis present

## 2019-01-21 DIAGNOSIS — J9621 Acute and chronic respiratory failure with hypoxia: Secondary | ICD-10-CM | POA: Diagnosis present

## 2019-01-21 DIAGNOSIS — E669 Obesity, unspecified: Secondary | ICD-10-CM | POA: Diagnosis present

## 2019-01-21 DIAGNOSIS — F319 Bipolar disorder, unspecified: Secondary | ICD-10-CM | POA: Diagnosis present

## 2019-01-21 DIAGNOSIS — E785 Hyperlipidemia, unspecified: Secondary | ICD-10-CM | POA: Diagnosis present

## 2019-01-21 DIAGNOSIS — Z8249 Family history of ischemic heart disease and other diseases of the circulatory system: Secondary | ICD-10-CM

## 2019-01-21 DIAGNOSIS — Z91041 Radiographic dye allergy status: Secondary | ICD-10-CM

## 2019-01-21 DIAGNOSIS — Z794 Long term (current) use of insulin: Secondary | ICD-10-CM

## 2019-01-21 LAB — URINALYSIS, ROUTINE W REFLEX MICROSCOPIC
Bacteria, UA: NONE SEEN
Bilirubin Urine: NEGATIVE
Glucose, UA: NEGATIVE mg/dL
Hgb urine dipstick: NEGATIVE
Ketones, ur: NEGATIVE mg/dL
Leukocytes,Ua: NEGATIVE
Nitrite: NEGATIVE
Protein, ur: NEGATIVE mg/dL
Specific Gravity, Urine: 1.009 (ref 1.005–1.030)
pH: 7 (ref 5.0–8.0)

## 2019-01-21 LAB — CBC WITH DIFFERENTIAL/PLATELET
Abs Immature Granulocytes: 0.06 10*3/uL (ref 0.00–0.07)
Basophils Absolute: 0 10*3/uL (ref 0.0–0.1)
Basophils Relative: 0 %
Eosinophils Absolute: 0.2 10*3/uL (ref 0.0–0.5)
Eosinophils Relative: 2 %
HCT: 42.6 % (ref 36.0–46.0)
Hemoglobin: 13.8 g/dL (ref 12.0–15.0)
Immature Granulocytes: 1 %
Lymphocytes Relative: 28 %
Lymphs Abs: 2.6 10*3/uL (ref 0.7–4.0)
MCH: 30.2 pg (ref 26.0–34.0)
MCHC: 32.4 g/dL (ref 30.0–36.0)
MCV: 93.2 fL (ref 80.0–100.0)
Monocytes Absolute: 0.6 10*3/uL (ref 0.1–1.0)
Monocytes Relative: 7 %
Neutro Abs: 5.7 10*3/uL (ref 1.7–7.7)
Neutrophils Relative %: 62 %
Platelets: 158 10*3/uL (ref 150–400)
RBC: 4.57 MIL/uL (ref 3.87–5.11)
RDW: 13.6 % (ref 11.5–15.5)
WBC: 9.3 10*3/uL (ref 4.0–10.5)
nRBC: 0 % (ref 0.0–0.2)

## 2019-01-21 LAB — COMPREHENSIVE METABOLIC PANEL
ALT: 16 U/L (ref 0–44)
AST: 17 U/L (ref 15–41)
Albumin: 3.6 g/dL (ref 3.5–5.0)
Alkaline Phosphatase: 89 U/L (ref 38–126)
Anion gap: 8 (ref 5–15)
BUN: 19 mg/dL (ref 8–23)
CO2: 24 mmol/L (ref 22–32)
Calcium: 8.8 mg/dL — ABNORMAL LOW (ref 8.9–10.3)
Chloride: 108 mmol/L (ref 98–111)
Creatinine, Ser: 0.66 mg/dL (ref 0.44–1.00)
GFR calc Af Amer: >60 mL/min (ref >60)
GFR calc non Af Amer: >60 mL/min (ref >60)
Glucose, Bld: 184 mg/dL — ABNORMAL HIGH (ref 70–99)
Potassium: 3.9 mmol/L (ref 3.5–5.1)
Sodium: 140 mmol/L (ref 135–145)
Total Bilirubin: 0.3 mg/dL (ref 0.3–1.2)
Total Protein: 6.7 g/dL (ref 6.5–8.1)

## 2019-01-21 LAB — BRAIN NATRIURETIC PEPTIDE: B Natriuretic Peptide: 202.6 pg/mL — ABNORMAL HIGH (ref 0.0–100.0)

## 2019-01-21 LAB — GLUCOSE, CAPILLARY: Glucose-Capillary: 216 mg/dL — ABNORMAL HIGH (ref 70–99)

## 2019-01-21 SURGERY — ARTHROPLASTY, SHOULDER, TOTAL, REVERSE
Anesthesia: General | Laterality: Right

## 2019-01-21 MED ORDER — LORAZEPAM 1 MG PO TABS
2.0000 mg | ORAL_TABLET | Freq: Every day | ORAL | Status: DC
  Administered 2019-01-21 – 2019-01-22 (×2): 2 mg via ORAL
  Filled 2019-01-21 (×2): qty 2

## 2019-01-21 MED ORDER — KETOROLAC TROMETHAMINE 15 MG/ML IJ SOLN
15.0000 mg | Freq: Three times a day (TID) | INTRAMUSCULAR | Status: AC | PRN
  Filled 2019-01-21: qty 1

## 2019-01-21 MED ORDER — DEXAMETHASONE SODIUM PHOSPHATE 10 MG/ML IJ SOLN
INTRAMUSCULAR | Status: AC
  Filled 2019-01-21: qty 1

## 2019-01-21 MED ORDER — CARBAMAZEPINE ER 100 MG PO TB12
100.0000 mg | ORAL_TABLET | Freq: Every day | ORAL | Status: DC
  Administered 2019-01-22 – 2019-01-23 (×2): 100 mg via ORAL
  Filled 2019-01-21 (×3): qty 1

## 2019-01-21 MED ORDER — FENTANYL CITRATE (PF) 100 MCG/2ML IJ SOLN
50.0000 ug | INTRAMUSCULAR | Status: DC
  Administered 2019-01-21: 25 ug via INTRAVENOUS
  Filled 2019-01-21: qty 2

## 2019-01-21 MED ORDER — PREGABALIN 75 MG PO CAPS
150.0000 mg | ORAL_CAPSULE | Freq: Two times a day (BID) | ORAL | Status: DC
  Administered 2019-01-21 – 2019-01-23 (×4): 150 mg via ORAL
  Filled 2019-01-21 (×4): qty 2

## 2019-01-21 MED ORDER — CHLORHEXIDINE GLUCONATE 4 % EX LIQD: 60.0000 mL | Freq: Once | CUTANEOUS | Status: DC

## 2019-01-21 MED ORDER — VITAMIN D 25 MCG (1000 UNIT) PO TABS
1000.0000 [IU] | ORAL_TABLET | Freq: Every day | ORAL | Status: DC
  Administered 2019-01-21 – 2019-01-23 (×3): 1000 [IU] via ORAL
  Filled 2019-01-21 (×3): qty 1

## 2019-01-21 MED ORDER — CARBAMAZEPINE ER 100 MG PO CP12: 100.0000 mg | ORAL_CAPSULE | Freq: Every day | ORAL | Status: DC

## 2019-01-21 MED ORDER — METOPROLOL TARTRATE 50 MG PO TABS: 125.0000 mg | ORAL_TABLET | Freq: Two times a day (BID) | ORAL | Status: DC

## 2019-01-21 MED ORDER — ALBUTEROL SULFATE (2.5 MG/3ML) 0.083% IN NEBU
INHALATION_SOLUTION | RESPIRATORY_TRACT | Status: AC
  Administered 2019-01-21: 2.5 mg
  Filled 2019-01-21: qty 3

## 2019-01-21 MED ORDER — KETOROLAC TROMETHAMINE 30 MG/ML IJ SOLN
30.0000 mg | Freq: Once | INTRAMUSCULAR | Status: AC
  Administered 2019-01-21: 30 mg via INTRAVENOUS
  Filled 2019-01-21: qty 1

## 2019-01-21 MED ORDER — ACETAMINOPHEN 325 MG PO TABS
650.0000 mg | ORAL_TABLET | Freq: Four times a day (QID) | ORAL | Status: DC | PRN
  Administered 2019-01-22 (×2): 650 mg via ORAL
  Filled 2019-01-21 (×2): qty 2

## 2019-01-21 MED ORDER — POLYVINYL ALCOHOL 1.4 % OP SOLN: 1.0000 [drp] | OPHTHALMIC | Status: DC | PRN

## 2019-01-21 MED ORDER — IPRATROPIUM-ALBUTEROL 0.5-2.5 (3) MG/3ML IN SOLN
3.0000 mL | Freq: Three times a day (TID) | RESPIRATORY_TRACT | Status: DC
  Administered 2019-01-21 – 2019-01-23 (×5): 3 mL via RESPIRATORY_TRACT
  Filled 2019-01-21 (×5): qty 3

## 2019-01-21 MED ORDER — TOPIRAMATE 25 MG PO TABS
75.0000 mg | ORAL_TABLET | Freq: Every day | ORAL | Status: DC
  Administered 2019-01-21 – 2019-01-22 (×2): 75 mg via ORAL
  Filled 2019-01-21 (×2): qty 3

## 2019-01-21 MED ORDER — PROPOFOL 10 MG/ML IV BOLUS
INTRAVENOUS | Status: AC
  Filled 2019-01-21: qty 20

## 2019-01-21 MED ORDER — TOPIRAMATE 25 MG PO TABS
25.0000 mg | ORAL_TABLET | Freq: Every day | ORAL | Status: DC
  Administered 2019-01-22 – 2019-01-23 (×2): 25 mg via ORAL
  Filled 2019-01-21 (×3): qty 1

## 2019-01-21 MED ORDER — FUROSEMIDE 10 MG/ML IJ SOLN
40.0000 mg | Freq: Every day | INTRAMUSCULAR | Status: DC
  Administered 2019-01-22 – 2019-01-23 (×2): 40 mg via INTRAVENOUS
  Filled 2019-01-21 (×3): qty 4

## 2019-01-21 MED ORDER — METOPROLOL TARTRATE 25 MG PO TABS
125.0000 mg | ORAL_TABLET | Freq: Two times a day (BID) | ORAL | Status: DC
  Administered 2019-01-21 – 2019-01-23 (×4): 125 mg via ORAL
  Filled 2019-01-21 (×4): qty 5

## 2019-01-21 MED ORDER — LACTATED RINGERS IV SOLN
INTRAVENOUS | Status: DC
  Administered 2019-01-21: 09:00:00 via INTRAVENOUS

## 2019-01-21 MED ORDER — ZOLPIDEM TARTRATE 5 MG PO TABS
5.0000 mg | ORAL_TABLET | Freq: Once | ORAL | Status: AC
  Administered 2019-01-21: 5 mg via ORAL
  Filled 2019-01-21: qty 1

## 2019-01-21 MED ORDER — IPRATROPIUM-ALBUTEROL 0.5-2.5 (3) MG/3ML IN SOLN: 3.0000 mL | Freq: Three times a day (TID) | RESPIRATORY_TRACT | Status: DC

## 2019-01-21 MED ORDER — RIZATRIPTAN BENZOATE 10 MG PO TBDP: 10.0000 mg | ORAL_TABLET | ORAL | Status: DC

## 2019-01-21 MED ORDER — DILTIAZEM HCL ER COATED BEADS 120 MG PO CP24
120.0000 mg | ORAL_CAPSULE | Freq: Every day | ORAL | Status: DC
  Administered 2019-01-21 – 2019-01-22 (×2): 120 mg via ORAL
  Filled 2019-01-21 (×2): qty 1

## 2019-01-21 MED ORDER — METOPROLOL TARTRATE 5 MG/5ML IV SOLN: 10.0000 mg | Freq: Four times a day (QID) | INTRAVENOUS | Status: DC | PRN

## 2019-01-21 MED ORDER — CEFAZOLIN SODIUM-DEXTROSE 2-4 GM/100ML-% IV SOLN
2.0000 g | INTRAVENOUS | Status: DC
  Filled 2019-01-21: qty 100

## 2019-01-21 MED ORDER — FUROSEMIDE 10 MG/ML IJ SOLN
60.0000 mg | Freq: Three times a day (TID) | INTRAMUSCULAR | Status: DC
  Administered 2019-01-21: 60 mg via INTRAVENOUS
  Filled 2019-01-21: qty 8

## 2019-01-21 MED ORDER — METOPROLOL TARTRATE 50 MG PO TABS: 100.0000 mg | ORAL_TABLET | Freq: Two times a day (BID) | ORAL | Status: DC

## 2019-01-21 MED ORDER — ARIPIPRAZOLE 10 MG PO TABS
30.0000 mg | ORAL_TABLET | Freq: Every day | ORAL | Status: DC
  Administered 2019-01-22 – 2019-01-23 (×2): 30 mg via ORAL
  Filled 2019-01-21 (×3): qty 3

## 2019-01-21 MED ORDER — MIDAZOLAM HCL 2 MG/2ML IJ SOLN
1.0000 mg | INTRAMUSCULAR | Status: DC
  Filled 2019-01-21: qty 2

## 2019-01-21 MED ORDER — LORAZEPAM 2 MG/ML IJ SOLN
1.0000 mg | Freq: Once | INTRAMUSCULAR | Status: AC
  Administered 2019-01-21: 16:00:00 1 mg via INTRAVENOUS
  Filled 2019-01-21: qty 1

## 2019-01-21 MED ORDER — ONDANSETRON HCL 4 MG/2ML IJ SOLN
INTRAMUSCULAR | Status: AC
  Filled 2019-01-21: qty 2

## 2019-01-21 MED ORDER — ATORVASTATIN CALCIUM 40 MG PO TABS
40.0000 mg | ORAL_TABLET | Freq: Every day | ORAL | Status: DC
  Administered 2019-01-21 – 2019-01-23 (×3): 40 mg via ORAL
  Filled 2019-01-21 (×3): qty 1

## 2019-01-21 MED ORDER — FUROSEMIDE 10 MG/ML IJ SOLN
40.0000 mg | Freq: Once | INTRAMUSCULAR | Status: AC
  Administered 2019-01-21: 14:00:00 40 mg via INTRAVENOUS
  Filled 2019-01-21: qty 4

## 2019-01-21 MED ORDER — FENTANYL CITRATE (PF) 100 MCG/2ML IJ SOLN
INTRAMUSCULAR | Status: AC
  Filled 2019-01-21: qty 2

## 2019-01-21 MED ORDER — SUCCINYLCHOLINE CHLORIDE 200 MG/10ML IV SOSY
PREFILLED_SYRINGE | INTRAVENOUS | Status: AC
  Filled 2019-01-21: qty 10

## 2019-01-21 MED ORDER — LORAZEPAM 1 MG PO TABS
1.0000 mg | ORAL_TABLET | Freq: Every day | ORAL | Status: DC
  Administered 2019-01-22 – 2019-01-23 (×2): 1 mg via ORAL
  Filled 2019-01-21 (×2): qty 1

## 2019-01-21 MED ORDER — BUPIVACAINE-EPINEPHRINE (PF) 0.25% -1:200000 IJ SOLN
INTRAMUSCULAR | Status: AC
  Filled 2019-01-21: qty 30

## 2019-01-21 MED ORDER — LIDOCAINE 2% (20 MG/ML) 5 ML SYRINGE
INTRAMUSCULAR | Status: AC
  Filled 2019-01-21: qty 5

## 2019-01-21 MED ORDER — BUPIVACAINE-EPINEPHRINE (PF) 0.5% -1:200000 IJ SOLN
INTRAMUSCULAR | Status: AC | PRN
  Administered 2019-01-21: 12 mL via PERINEURAL

## 2019-01-21 MED ORDER — ALBUTEROL SULFATE (2.5 MG/3ML) 0.083% IN NEBU: 2.5000 mg | INHALATION_SOLUTION | Freq: Once | RESPIRATORY_TRACT | Status: DC

## 2019-01-21 MED ORDER — METHYLPREDNISOLONE SODIUM SUCC 125 MG IJ SOLR
60.0000 mg | Freq: Once | INTRAMUSCULAR | Status: AC
  Administered 2019-01-21: 60 mg via INTRAVENOUS
  Filled 2019-01-21: qty 2

## 2019-01-21 MED ORDER — FUROSEMIDE 10 MG/ML IJ SOLN: 60.0000 mg | Freq: Three times a day (TID) | INTRAMUSCULAR | Status: DC

## 2019-01-21 NOTE — H&P (Signed)
History and Physical  Amber Stephenson [id number] DOB: 1946-04-25 DOA: 01/21/2019  Referring physician: Dr. Vanita Panda  PCP: [name], DO  Outpatient Specialists: Orthopedic surgery Patient coming from: Home  Chief Complaint: Shortness of breath.  HPI: Amber Stephenson is a 73 y.o. female with medical history significant for chronic A. fib on Eliquis, coronary artery disease, chronic diastolic CHF, chronic anxiety/depression/fibromyalgia/bipolar disorder, right shoulder osteoarthritis post nerve block today 01/21/2019.  She presented after her procedure to the ED due to sudden shortness of breath and new oxygen requirement.  She is in the room accompanied by her husband.  She was in her usual state of health prior to her procedure this morning.  She denies any chest pain.  Not on oxygen supplementation at baseline.  Now requiring at least 2 L of oxygen by nasal cannula to maintain O2 saturation greater than 90%.  No cough.  Chest x-ray done in the ED personally reviewed showed cardiomegaly with increase in pulmonary vascularity and left pleural effusion.  Given IV Lasix with some improvement.  ED Course: Vital signs remarkable for uncontrolled hypertension and tachypnea.  Lab studies remarkable for elevated BNP.  TRH asked to admit.  Review of Systems: Review of systems as noted in the HPI. All other systems reviewed and are negative.   Past Medical History:  Diagnosis Date  . Atrial fibrillation (HCC)    PERMANENT  . Bipolar affective ([place])   . CAD (coronary artery disease)    NONOBSTRUCTIVE CAD; MGD BY DR. Bronson Ing   . CHF (congestive heart failure) (HCC)    DIASTOLIC  . Chronic back pain    LOWER   . Diabetes mellitus without complication ([place])    TYPE 2   . Hyperlipidemia   . Hypertension   . Morbid obesity due to excess calories ([place]) 07/06/2018  . Pain management   . Pulmonary HTN ([place])   . Renal insufficiency    DR [facility]     Past Surgical History:   Procedure Laterality Date  . BREAST REDUCTION SURGERY    . EYE SURGERY Right    cateracts  . HAMMER TOE SURGERY    . LEFT HEART CATH AND CORONARY ANGIOGRAPHY N/A 02/03/2018   Procedure: LEFT HEART CATH AND CORONARY ANGIOGRAPHY;  Surgeon: Martinique, Peter M, MD;  Location: [place] [facility];  Service: Cardiovascular;  Laterality: N/A;  . SHOULDER SURGERY Right    "I BROKE MY SHOUDLER  . THIGH SURGERY     "TO REMOVE A TUMOR "    Social History:  reports that she has never smoked. She has never used smokeless tobacco. She reports that she does not drink alcohol or use drugs.   Allergies  Allergen Reactions  . Ivp Dye [Iodinated Diagnostic Agents] Anaphylaxis and Swelling    Throat closes  . Latex     Latex tape pulls skin with it    Family History  Problem Relation Age of Onset  . Diabetes Mother   . Heart disease Mother   . Heart disease Brother   . Heart disease Sister        CABG  . Alcohol abuse Sister   . Mental illness Brother   . Diabetes Brother       Prior to Admission medications   Medication Sig Start Date End Date Taking? Authorizing Provider  acetaminophen (TYLENOL) 325 MG tablet Take 325-650 mg by mouth every 6 (six) hours as needed for mild pain, moderate pain or headache.   Yes [provider]  apixaban (ELIQUIS) 5 MG TABS tablet Take 1 tablet (5 mg total) by mouth 2 (two) times daily. 12/06/18  Yes Hendricks Limes F, FNP  ARIPiprazole (ABILIFY) 10 MG tablet Take 3 tablets (30 mg total) by mouth daily. 12/23/18  Yes Plovsky, Berneta Sages, MD  atorvastatin (LIPITOR) 40 MG tablet TAKE 1 TABLET DAILY Patient taking differently: Take 40 mg by mouth daily.  09/13/18  Yes Gottschalk, Leatrice Jewels M, DO  budesonide-formoterol (SYMBICORT) 80-4.5 MCG/ACT inhaler Inhale 2 puffs into the lungs 2 (two) times daily. Patient taking differently: Inhale 2 puffs into the lungs 2 (two) times daily as needed (shortness of breath).  03/04/18  Yes Gottschalk, Leatrice Jewels M, DO  carbamazepine  (CARBATROL) 100 MG 12 hr capsule Take 1 capsule (100 mg total) by mouth daily. 12/23/18 12/23/19 Yes Plovsky, Berneta Sages, MD  Cholecalciferol (VITAMIN D3) 1000 UNITS CAPS Take 1,000 Units by mouth daily.   Yes [provider]  diltiazem (CARDIZEM CD) 120 MG 24 hr capsule TAKE 1 CAPSULE AT BEDTIME Patient taking differently: Take 120 mg by mouth at bedtime.  11/04/18  Yes Herminio Commons, MD  Dulaglutide (TRULICITY) 1.5 0000000 SOPN Inject 1.5 mg into the skin once a week. 08/20/17  Yes Timmothy Euler, MD  Eszopiclone (ESZOPICLONE) 3 MG TABS Take 1 tablet (3 mg total) by mouth at bedtime as needed. Take immediately before bedtime Patient taking differently: Take 3 mg by mouth at bedtime as needed (sleep). Take immediately before bedtime 12/23/18 12/23/19 Yes Plovsky, Berneta Sages, MD  furosemide (LASIX) 20 MG tablet Take 1 tablet (20 mg total) by mouth 2 (two) times daily. 06/17/18  Yes Gottschalk, Leatrice Jewels M, DO  Icosapent Ethyl (VASCEPA) 1 g CAPS Take 2 capsules (2 g total) by mouth 2 (two) times daily with a meal. 11/15/18  Yes Gottschalk, Ashly M, DO  Insulin Glargine (LANTUS SOLOSTAR) 100 UNIT/ML Solostar Pen INJECT 60 UNITS UNDER THE SKIN TWICE A DAY Patient taking differently: Inject 30 Units into the skin 2 (two) times daily.  01/25/18  Yes Herminio Commons, MD  LORazepam (ATIVAN) 1 MG tablet Take 1 tab (1 mg) in the morning and 2 tabs (2 mg ) at bedtime Patient taking differently: Take 1-2 mg by mouth See admin instructions. Take 1 tab (1 mg) in the morning and 2 tabs (2 mg ) at bedtime 12/23/18  Yes Plovsky, Berneta Sages, MD  metoprolol tartrate (LOPRESSOR) 100 MG tablet TAKE 1 TABLET TWICE A DAY WITH METOPROLOL 25 MG TO EQUAL 125 MG Patient taking differently: Take 100 mg by mouth 2 (two) times daily. Take 100mg  + 25mg  for a total of 125mg  twice a day. 11/15/18  Yes Herminio Commons, MD  metoprolol tartrate (LOPRESSOR) 25 MG tablet Take 1 tablet (25 mg total) by mouth 2 (two) times daily. Take  with metoprolol 100 mg to equal 125 mg Patient taking differently: Take 25 mg by mouth 2 (two) times daily. Take 100mg  + 25mg  for a total of 125mg  twice a day. 05/24/18  Yes Herminio Commons, MD  naloxone [facility]) nasal spray 4 mg/0.1 mL Place 1 spray into the nose once as needed (opiod overdose).   Yes [provider]  nystatin (MYCOSTATIN/NYSTOP) powder Apply topically 3 (three) times daily. x2 weeks (for groin/breast rash) Patient taking differently: Apply 1 g topically 3 (three) times daily as needed (rash).  10/26/18  Yes Ronnie Doss M, DO  oxyCODONE-acetaminophen (PERCOCET/ROXICET) 5-325 MG tablet Take 1 tablet by mouth 4 (four) times daily. 12/31/18  Yes  [provider]  polyvinyl alcohol (LIQUIFILM TEARS) 1.4 % ophthalmic solution Place 1 drop into both eyes as needed for dry eyes.   Yes [provider]  pregabalin (LYRICA) 150 MG capsule Take 1 capsule (150 mg total) by mouth 2 (two) times daily. 07/28/18 07/28/19 Yes Plovsky, Berneta Sages, MD  rizatriptan (MAXALT-MLT) 10 MG disintegrating tablet DISSOLVE 1 TABLET ON THE TONGUE AT FIRST SIGN OF HEADACHE. MAY REPEAT ONE DOSE IN 1 HOUR IF NEEDED Patient taking differently: Take 10 mg by mouth See admin instructions. DISSOLVE 1 TABLET ON THE TONGUE AT FIRST SIGN OF HEADACHE. MAY REPEAT ONE DOSE IN 1 HOUR IF NEEDED 11/23/18  Yes Gottschalk, Ashly M, DO  topiramate (TOPAMAX) 25 MG tablet 1 tablet in the morning and 3 tablets every night Patient taking differently: Take 25-75 mg by mouth See admin instructions. Take 1 tablet in the morning and 3 tablets every night. 11/19/18  Yes Cameron Sprang, MD  traZODone (DESYREL) 100 MG tablet 4  qhs Patient taking differently: Take 400 mg by mouth at bedtime.  12/23/18  Yes Plovsky, Berneta Sages, MD  albuterol (PROVENTIL) (2.5 MG/3ML) 0.083% nebulizer solution Take 3 mLs (2.5 mg total) by nebulization every 6 (six) hours as needed for wheezing or shortness of breath. Patient not taking:  Reported on 01/21/2019 07/19/18   [name], DO  albuterol (VENTOLIN HFA) 108 (90 Base) MCG/ACT inhaler Inhale 2 puffs into the lungs every 6 (six) hours as needed for wheezing or shortness of breath. Patient not taking: Reported on 01/21/2019 11/17/18   Martyn Ehrich, NP  Blood Glucose Monitoring Suppl (FREESTYLE LITE) DEVI Use to check blood sugar tid. DX E11.22 04/04/15   Timmothy Euler, MD  glucose blood (FREESTYLE LITE) test strip USE FOR TESTING SUGAR THREE TIMES DAILY 06/25/18   Ronnie Doss M, DO  Insulin Pen Needle (SURE COMFORT PEN NEEDLES) 32G X 4 MM MISC Use with insulin pens as directed. Dx E11.9 08/16/15   Timmothy Euler, MD  Lancets (FREESTYLE) lancets Test tid. DX E11.22 08/12/17   Timmothy Euler, MD  tizanidine (ZANAFLEX) 2 MG capsule Take 1 capsule (2 mg total) by mouth 3 (three) times daily as needed for muscle spasms. Patient not taking: Reported on 01/13/2019 10/26/18   [name], DO    Physical Exam: BP (!) 142/102   Pulse (!) 102   Temp 97.6 F (36.4 C) (Oral)   Resp (!) 25   Ht 5\' 2"  (1.575 m)   Wt 89.8 kg   SpO2 94%   BMI 36.21 kg/m   . General: 73 y.o. year-old female well developed well nourished in no acute distress.  Alert and oriented x4. . Cardiovascular: Irregular rate and rhythm with no rubs or gallops.  No thyromegaly or JVD noted.  No lower extremity edema. 2/4 pulses in all 4 extremities. Marland Kitchen Respiratory: Mild rales at bases with no wheezes initially then she developed audible wheezing. Good inspiratory effort. . Abdomen: Soft nontender nondistended with normal bowel sounds x4 quadrants. . Muskuloskeletal: No cyanosis, clubbing or edema noted bilaterally . Neuro: CN II-XII intact, strength, sensation, reflexes . Skin: No ulcerative lesions noted or rashes . Psychiatry: Judgement and insight appear normal. Mood is anxious.         Labs on Admission:  Basic Metabolic Panel: Recent Labs  Lab 01/18/19 1454 01/21/19  1156  NA 141 140  K 4.3 3.9  CL 107 108  CO2 27 24  GLUCOSE 216* 184*  BUN  18 19  CREATININE 0.88 0.66  CALCIUM 9.3 8.8*   Liver Function Tests: Recent Labs  Lab 01/21/19 1156  AST 17  ALT 16  ALKPHOS 89  BILITOT 0.3  PROT 6.7  ALBUMIN 3.6   No results for input(s): LIPASE, AMYLASE in the last 168 hours. No results for input(s): AMMONIA in the last 168 hours. CBC: Recent Labs  Lab 01/18/19 1454 01/21/19 1156  WBC 10.5 9.3  NEUTROABS  --  5.7  HGB 14.5 13.8  HCT 44.9 42.6  MCV 93.2 93.2  PLT 181 158   Cardiac Enzymes: No results for input(s): CKTOTAL, CKMB, CKMBINDEX, TROPONINI in the last 168 hours.  BNP (last 3 results) Recent Labs    01/21/19 1156  BNP 202.6*    ProBNP (last 3 results) No results for input(s): PROBNP in the last 8760 hours.  CBG: Recent Labs  Lab 01/18/19 1406 01/21/19 0758  GLUCAP 218* 216*    Radiological Exams on Admission: Dg Chest Port 1 View  Result Date: 01/21/2019 CLINICAL DATA:  Shortness of breath. EXAM: PORTABLE CHEST 1 VIEW COMPARISON:  Radiograph of March 06, 2018. FINDINGS: Stable cardiomegaly. No pneumothorax is noted. Right lung is clear. Mild left basilar opacity is noted concerning for atelectasis or infiltrate with associated pleural effusion. Bony thorax is unremarkable. IMPRESSION: Mild left basilar atelectasis or infiltrate is noted with associated pleural effusion. Electronically Signed   By: Marijo Conception M.D.   On: 01/21/2019 10:26    EKG: I independently viewed the EKG done and my findings are as followed: A. fib with rate of 82 no specific ST-T changes.  Assessment/Plan Present on Admission: . Pulmonary edema  Active Problems:   Pulmonary edema  Acute pulmonary edema, likely cardiogenic Independently reviewed chest x-ray done on admission which shows cardiomegaly with increase in pulmonary vascularity indicative of mild pulmonary edema and left pleural effusion Started on IV Lasix, ongoing  diuresing Closely monitor urine output Obtain home O2 evaluation in the morning  Acute hypoxic respiratory failure likely secondary to acute pulmonary edema Not on oxygen supplementation at baseline Now requiring 2 L to maintain O2 saturation greater than 90% Later she started having audible wheezing Ordered 1 dose of Solu-Medrol 60 mg once Continue to closely monitor  Mild acute on chronic diastolic CHF BNP elevated 202 on 01/21/2019, from 185 on 10/16/2017 Ongoing diuresing Start strict I's and O's and daily weight  Uncontrolled hypertension Resume antihypertensives IV Lopressor 10 mg every 6 hours PRN for systolic blood pressure greater than 160 Continue to monitor vital signs  Chronic A. Fib On Eliquis, was on hold due to planned procedure today Resume Eliquis if okay with orthopedic surgery Resume Cardizem p.o. and Lopressor Closely monitor on telemetry  Right shoulder osteoarthritis post nerve block Optimize pain control  Appreciate orthopedic input  Chronic depression/anxiety/bipolar disorder/fibromyalgia Resume home medications    DVT prophylaxis: On Eliquis, will resume if okay with orthopedic surgery.  SCDs.  Code Status: Full code  Family Communication: Updated husband at bedside in the ED.  Disposition Plan: Admit to telemetry unit  Consults called: Orthopedic surgery contacted by EDP  Admission status: Observation status    Kayleen Memos MD [facility] Pager [phone]  If 7PM-7AM, please contact night-coverage [email or web address] Password [facility]  01/21/2019, 5:17 PM

## 2019-01-21 NOTE — ED Notes (Signed)
ED TO INPATIENT HANDOFF REPORT  Name/Age/Gender Amber Stephenson 73 y.o. female  Code Status Code Status History    Date Active Date Inactive Code Status Order ID Comments User Context   03/06/2018 0231 03/08/2018 1646 Full Code [id number]  Etta Quill, DO ED   02/03/2018 1416 02/03/2018 1935 Full Code [id number]  Martinique, Peter M, MD Inpatient   05/19/2017 1955 05/20/2017 1956 Full Code [id number]  Vianne Bulls, MD ED   10/01/2015 1255 10/03/2015 1330 Full Code [id number]  Lajean Saver, MD ED   05/30/2015 0205 05/30/2015 2010 Full Code [id number]  Merton Border, MD ED   Advance Care Planning Activity      Home/SNF/Other Home  Chief Complaint shortness of breath  Level of Care/Admitting Diagnosis ED Disposition    ED Disposition Condition [facility] Area: [facility] [id number]  Level of Care: Telemetry [5]  Admit to tele based on following criteria: Monitor for Ischemic changes  Covid Evaluation: Confirmed COVID Negative  Diagnosis: Pulmonary edema [id number]  Admitting Physician: Kayleen Memos [id number]  Attending Physician: Kayleen Memos [id number]  PT Class (Do Not Modify): Observation [104]  PT Acc Code (Do Not Modify): Observation [10022]       Medical History Past Medical History:  Diagnosis Date  . Atrial fibrillation (HCC)    PERMANENT  . Bipolar affective ([place])   . CAD (coronary artery disease)    NONOBSTRUCTIVE CAD; MGD BY DR. Bronson Ing   . CHF (congestive heart failure) (HCC)    DIASTOLIC  . Chronic back pain    LOWER   . Diabetes mellitus without complication ([place])    TYPE 2   . Hyperlipidemia   . Hypertension   . Morbid obesity due to excess calories ([place]) 07/06/2018  . Pain management   . Pulmonary HTN ([place])   . Renal insufficiency    DR [facility]      Allergies Allergies  Allergen Reactions  . Ivp Dye [Iodinated Diagnostic Agents] Anaphylaxis and Swelling    Throat closes  . Latex     Latex tape pulls skin with it     IV Location/Drains/Wounds Patient Lines/Drains/Airways Status   Active Line/Drains/Airways    Name:   Placement date:   Placement time:   Site:   Days:   Peripheral IV 01/21/19 Left Forearm   01/21/19    0836    Forearm   less than 1          Labs/Imaging Results for orders placed or performed during the hospital encounter of 01/21/19 (from the past 48 hour(s))  Comprehensive metabolic panel     Status: Abnormal   Collection Time: 01/21/19 11:56 AM  Result Value Ref Range   Sodium 140 135 - 145 mmol/L   Potassium 3.9 3.5 - 5.1 mmol/L   Chloride 108 98 - 111 mmol/L   CO2 24 22 - 32 mmol/L   Glucose, Bld 184 (H) 70 - 99 mg/dL   BUN 19 8 - 23 mg/dL   Creatinine, Ser 0.66 0.44 - 1.00 mg/dL   Calcium 8.8 (L) 8.9 - 10.3 mg/dL   Total Protein 6.7 6.5 - 8.1 g/dL   Albumin 3.6 3.5 - 5.0 g/dL   AST 17 15 - 41 U/L   ALT 16 0 - 44 U/L   Alkaline Phosphatase 89 38 - 126 U/L   Total Bilirubin 0.3 0.3 - 1.2 mg/dL   GFR calc non Af Amer >60 >60 mL/min   GFR  calc Af Amer >60 >60 mL/min   Anion gap 8 5 - 15    Comment: Performed at [facility], [place] [address] [place], [place] [address]  CBC WITH DIFFERENTIAL     Status: None   Collection Time: 01/21/19 11:56 AM  Result Value Ref Range   WBC 9.3 4.0 - 10.5 K/uL   RBC 4.57 3.87 - 5.11 MIL/uL   Hemoglobin 13.8 12.0 - 15.0 g/dL   HCT 42.6 36.0 - 46.0 %   MCV 93.2 80.0 - 100.0 fL   MCH 30.2 26.0 - 34.0 pg   MCHC 32.4 30.0 - 36.0 g/dL   RDW 13.6 11.5 - 15.5 %   Platelets 158 150 - 400 K/uL   nRBC 0.0 0.0 - 0.2 %   Neutrophils Relative % 62 %   Neutro Abs 5.7 1.7 - 7.7 K/uL   Lymphocytes Relative 28 %   Lymphs Abs 2.6 0.7 - 4.0 K/uL   Monocytes Relative 7 %   Monocytes Absolute 0.6 0.1 - 1.0 K/uL   Eosinophils Relative 2 %   Eosinophils Absolute 0.2 0.0 - 0.5 K/uL   Basophils Relative 0 %   Basophils Absolute 0.0 0.0 - 0.1 K/uL   Immature Granulocytes 1 %   Abs Immature Granulocytes 0.06 0.00 - 0.07 K/uL     Comment: Performed at [facility], [place] [address], [place], [place] [address]  Brain natriuretic peptide     Status: Abnormal   Collection Time: 01/21/19 11:56 AM  Result Value Ref Range   B Natriuretic Peptide 202.6 (H) 0.0 - 100.0 pg/mL    Comment: Performed at [facility], [place] [address] [place], [place] [address]  Urinalysis, Routine w reflex microscopic     Status: Abnormal   Collection Time: 01/21/19  3:13 PM  Result Value Ref Range   Color, Urine STRAW (A) YELLOW   APPearance CLEAR CLEAR   Specific Gravity, Urine 1.009 1.005 - 1.030   pH 7.0 5.0 - 8.0   Glucose, UA NEGATIVE NEGATIVE mg/dL   Hgb urine dipstick NEGATIVE NEGATIVE   Bilirubin Urine NEGATIVE NEGATIVE   Ketones, ur NEGATIVE NEGATIVE mg/dL   Protein, ur NEGATIVE NEGATIVE mg/dL   Nitrite NEGATIVE NEGATIVE   Leukocytes,Ua NEGATIVE NEGATIVE   RBC / HPF 0-5 0 - 5 RBC/hpf   WBC, UA 0-5 0 - 5 WBC/hpf   Bacteria, UA NONE SEEN NONE SEEN   Squamous Epithelial / LPF 0-5 0 - 5    Comment: Performed at [facility], [place] [address], [place], [place] [address]   Dg Chest Port 1 View  Result Date: 01/21/2019 CLINICAL DATA:  Shortness of breath. EXAM: PORTABLE CHEST 1 VIEW COMPARISON:  Radiograph of March 06, 2018. FINDINGS: Stable cardiomegaly. No pneumothorax is noted. Right lung is clear. Mild left basilar opacity is noted concerning for atelectasis or infiltrate with associated pleural effusion. Bony thorax is unremarkable. IMPRESSION: Mild left basilar atelectasis or infiltrate is noted with associated pleural effusion. Electronically Signed   By: Marijo Conception M.D.   On: 01/21/2019 10:26    Pending Labs Unresulted Labs (From admission, onward)    Start     Ordered   01/22/19 [id number]  Basic metabolic panel  Tomorrow morning,   R     01/21/19 1424   01/22/19 0500  Magnesium  Tomorrow morning,   R     01/21/19 1424          Vitals/Pain Today's Vitals  01/21/19 1430 01/21/19 1434 01/21/19 1500 01/21/19 1600  BP: (!) 158/98 (!) 150/65 (!) 154/84 (!) 148/97  Pulse: 84 69 83 70  Resp: (!) 24 (!) 21 (!) 23   Temp:      TempSrc:      SpO2: 94% 98% 96% 95%  Weight:      Height:      PainSc:        Isolation Precautions No active isolations  Medications Medications  furosemide (LASIX) injection 60 mg (60 mg Intravenous Given 01/21/19 1616)  furosemide (LASIX) injection 40 mg (40 mg Intravenous Given 01/21/19 1345)  LORazepam (ATIVAN) injection 1 mg (1 mg Intravenous Given 01/21/19 1613)    Mobility walks

## 2019-01-21 NOTE — Progress Notes (Signed)
AssistedDr. Hollis with right, ultrasound guided, interscalene  block. Side rails up, monitors on throughout procedure. See vital signs in flow sheet. Tolerated Procedure well.  

## 2019-01-21 NOTE — ED Provider Notes (Signed)
[facility] DEPT Provider Note   CSN: [id number] Arrival date & time: 01/21/19  1101     History   Chief Complaint Chief Complaint  Patient presents with  . Shortness of Breath  . Altered Mental Status    HPI Amber Stephenson is a 73 y.o. female.     HPI Patient presents from the preoperative area with concerns of shortness of breath and altered mental status. Patient acknowledges multiple medical problems, including CAD, CHF, A. fib. She states that she takes all medication as directed including her Eliquis. Today she was preparing for right shoulder repair following prior fall, fracture. In preparation for the surgery she did receive a loading dose of cephalosporin. She received an interscalene block, and then reportedly soon thereafter became confused, with shortness of breath. She received 1 albuterol treatment, but with persistent abnormalities was sent here for evaluation. Reportedly the x-ray performed at that time demonstrated a pleural effusion. The patient currently denies complaints, including shortness of breath or pain. She does state that she feels slightly lightheaded, but denies overt confusion. Past Medical History:  Diagnosis Date  . Atrial fibrillation (HCC)    PERMANENT  . Bipolar affective ([place])   . CAD (coronary artery disease)    NONOBSTRUCTIVE CAD; MGD BY DR. Bronson Ing   . CHF (congestive heart failure) (HCC)    DIASTOLIC  . Chronic back pain    LOWER   . Diabetes mellitus without complication ([place])    TYPE 2   . Hyperlipidemia   . Hypertension   . Morbid obesity due to excess calories ([place]) 07/06/2018  . Pain management   . Pulmonary HTN ([place])   . Renal insufficiency    DR [facility]      Patient Active Problem List   Diagnosis Date Noted  . Obesity (BMI 30-39.9) 07/06/2018  . At risk for sleep apnea 07/06/2018  . Closed fracture of proximal end of left humerus 03/16/2018  . Left humeral fracture 03/06/2018   . Fracture of four ribs of left side, closed, initial encounter 03/06/2018  . Mild intermittent asthma without complication 123456  . Cardiomegaly 01/12/2018  . NAFL (nonalcoholic fatty liver) 123456  . Age-related osteoporosis without current pathological fracture 12/15/2017  . Asthma 11/12/2017  . Atrial fibrillation with RVR ([place]) 05/19/2017  . Microscopic colitis 05/19/2017  . Hypotension 05/19/2017  . Fibromyalgia 03/19/2017  . Healthcare maintenance 02/12/2016  . Migraine headache with aura 02/12/2016  . Diabetic neuropathy ([place]) 02/06/2016  . Depression   . Herpes genitalis in women 07/16/2015  . Chronic anticoagulation 05/30/2015  . Family history of coronary artery disease in sister 05/30/2015  . Chronic diastolic CHF (congestive heart failure) ([place]) 05/30/2015  . Chest pain with moderate risk of acute coronary syndrome 05/29/2015  . Dyspnea on exertion   . Essential hypertension   . RLS (restless legs syndrome) 04/27/2015  . Lipoma 02/08/2015  . Bipolar disorder ([place]) 01/23/2015  . Insomnia 01/23/2015  . Chronic back pain 01/04/2015  . Permanent atrial fibrillation 01/04/2015  . DM2 (diabetes mellitus, type 2) ([place])   . Hyperlipidemia     Past Surgical History:  Procedure Laterality Date  . BREAST REDUCTION SURGERY    . EYE SURGERY Right    cateracts  . HAMMER TOE SURGERY    . LEFT HEART CATH AND CORONARY ANGIOGRAPHY N/A 02/03/2018   Procedure: LEFT HEART CATH AND CORONARY ANGIOGRAPHY;  Surgeon: Martinique, Peter M, MD;  Location: [facility];  Service: Cardiovascular;  Laterality: N/A;  . SHOULDER SURGERY Right    "I BROKE MY SHOUDLER  . THIGH SURGERY     "TO REMOVE A TUMOR "     OB History   No obstetric history on file.      Home Medications    Prior to Admission medications   Medication Sig Start Date End Date Taking? Authorizing Provider  albuterol (PROVENTIL) (2.5 MG/3ML) 0.083% nebulizer solution Take 3 mLs (2.5 mg total) by  nebulization every 6 (six) hours as needed for wheezing or shortness of breath. 07/19/18   [name], DO  albuterol (VENTOLIN HFA) 108 (90 Base) MCG/ACT inhaler Inhale 2 puffs into the lungs every 6 (six) hours as needed for wheezing or shortness of breath. 11/17/18   Martyn Ehrich, NP  apixaban (ELIQUIS) 5 MG TABS tablet Take 1 tablet (5 mg total) by mouth 2 (two) times daily. 12/06/18   Loman Brooklyn, FNP  ARIPiprazole (ABILIFY) 10 MG tablet Take 3 tablets (30 mg total) by mouth daily. 12/23/18   Plovsky, Berneta Sages, MD  atorvastatin (LIPITOR) 40 MG tablet TAKE 1 TABLET DAILY Patient taking differently: Take 40 mg by mouth daily.  09/13/18   [name], DO  Blood Glucose Monitoring Suppl (FREESTYLE LITE) DEVI Use to check blood sugar tid. DX E11.22 04/04/15   Timmothy Euler, MD  budesonide-formoterol (SYMBICORT) 80-4.5 MCG/ACT inhaler Inhale 2 puffs into the lungs 2 (two) times daily. Patient taking differently: Inhale 2 puffs into the lungs 2 (two) times daily as needed (shortness of breath).  03/04/18   [name], DO  carbamazepine (CARBATROL) 100 MG 12 hr capsule Take 1 capsule (100 mg total) by mouth daily. 12/23/18 12/23/19  Plovsky, Berneta Sages, MD  Cholecalciferol (VITAMIN D3) 1000 UNITS CAPS Take 1,000 Units by mouth daily.    [provider]  diltiazem (CARDIZEM CD) 120 MG 24 hr capsule TAKE 1 CAPSULE AT BEDTIME Patient taking differently: Take 120 mg by mouth at bedtime.  11/04/18   Herminio Commons, MD  Dulaglutide (TRULICITY) 1.5 0000000 SOPN Inject 1.5 mg into the skin once a week. 08/20/17   Timmothy Euler, MD  Eszopiclone (ESZOPICLONE) 3 MG TABS Take 1 tablet (3 mg total) by mouth at bedtime as needed. Take immediately before bedtime Patient taking differently: Take 3 mg by mouth at bedtime as needed (sleep). Take immediately before bedtime 12/23/18 12/23/19  Norma Fredrickson, MD  furosemide (LASIX) 20 MG tablet Take 1 tablet (20 mg total) by mouth 2  (two) times daily. 06/17/18   [name], DO  glucose blood (FREESTYLE LITE) test strip USE FOR TESTING SUGAR THREE TIMES DAILY 06/25/18   Ronnie Doss M, DO  Icosapent Ethyl (VASCEPA) 1 g CAPS Take 2 capsules (2 g total) by mouth 2 (two) times daily with a meal. 11/15/18   Gottschalk, Ashly M, DO  Insulin Glargine (LANTUS SOLOSTAR) 100 UNIT/ML Solostar Pen INJECT 60 UNITS UNDER THE SKIN TWICE A DAY Patient taking differently: Inject 60 Units into the skin 2 (two) times daily.  01/25/18   Herminio Commons, MD  Insulin Pen Needle (SURE COMFORT PEN NEEDLES) 32G X 4 MM MISC Use with insulin pens as directed. Dx E11.9 08/16/15   Timmothy Euler, MD  Lancets (FREESTYLE) lancets Test tid. DX E11.22 08/12/17   Timmothy Euler, MD  LORazepam (ATIVAN) 1 MG tablet Take 1 tab (1 mg) in the morning and 2 tabs (2 mg ) at bedtime Patient taking differently: Take 1-2  mg by mouth See admin instructions. Take 1 tab (1 mg) in the morning and 2 tabs (2 mg ) at bedtime 12/23/18   Plovsky, Berneta Sages, MD  metoprolol tartrate (LOPRESSOR) 100 MG tablet TAKE 1 TABLET TWICE A DAY WITH METOPROLOL 25 MG TO EQUAL 125 MG Patient taking differently: Take 100 mg by mouth 2 (two) times daily. Take 100mg  + 25mg  for a total of 125mg  twice a day. 11/15/18   Herminio Commons, MD  metoprolol tartrate (LOPRESSOR) 25 MG tablet Take 1 tablet (25 mg total) by mouth 2 (two) times daily. Take with metoprolol 100 mg to equal 125 mg Patient taking differently: Take 25 mg by mouth 2 (two) times daily. Take 100mg  + 25mg  for a total of 125mg  twice a day. 05/24/18   Herminio Commons, MD  naloxone [facility]) nasal spray 4 mg/0.1 mL Place 1 spray into the nose once as needed (opiod overdose).    [provider]  nystatin (MYCOSTATIN/NYSTOP) powder Apply topically 3 (three) times daily. x2 weeks (for groin/breast rash) Patient taking differently: Apply 1 g topically 3 (three) times daily as needed (rash).  10/26/18    [name], DO  oxyCODONE-acetaminophen (PERCOCET/ROXICET) 5-325 MG tablet Take 1 tablet by mouth 4 (four) times daily. 12/31/18   [provider]  pregabalin (LYRICA) 150 MG capsule Take 1 capsule (150 mg total) by mouth 2 (two) times daily. 07/28/18 07/28/19  Plovsky, Berneta Sages, MD  rizatriptan (MAXALT-MLT) 10 MG disintegrating tablet DISSOLVE 1 TABLET ON THE TONGUE AT FIRST SIGN OF HEADACHE. MAY REPEAT ONE DOSE IN 1 HOUR IF NEEDED Patient taking differently: Take 10 mg by mouth See admin instructions. DISSOLVE 1 TABLET ON THE TONGUE AT FIRST SIGN OF HEADACHE. MAY REPEAT ONE DOSE IN 1 HOUR IF NEEDED 11/23/18   Ronnie Doss M, DO  tizanidine (ZANAFLEX) 2 MG capsule Take 1 capsule (2 mg total) by mouth 3 (three) times daily as needed for muscle spasms. Patient not taking: Reported on 01/13/2019 10/26/18   [name], DO  topiramate (TOPAMAX) 25 MG tablet 1 tablet in the morning and 3 tablets every night Patient taking differently: Take 25-75 mg by mouth See admin instructions. Take 1 tablet in the morning and 3 tablets every night. 11/19/18   Cameron Sprang, MD  traZODone (DESYREL) 100 MG tablet 4  qhs Patient taking differently: Take 400 mg by mouth at bedtime.  12/23/18   Norma Fredrickson, MD    Family History Family History  Problem Relation Age of Onset  . Diabetes Mother   . Heart disease Mother   . Heart disease Brother   . Heart disease Sister        CABG  . Alcohol abuse Sister   . Mental illness Brother   . Diabetes Brother     Social History Social History   Tobacco Use  . Smoking status: Never Smoker  . Smokeless tobacco: Never Used  Substance Use Topics  . Alcohol use: No    Alcohol/week: 0.0 standard drinks  . Drug use: No     Allergies   Ivp dye [iodinated diagnostic agents] and Latex   Review of Systems Review of Systems  Constitutional:       Per HPI, otherwise negative  HENT:       Per HPI, otherwise negative  Respiratory:        Per HPI, otherwise negative  Cardiovascular:       Per HPI, otherwise negative  Gastrointestinal: Negative for vomiting.  Endocrine:       Negative aside from HPI  Genitourinary:       Neg aside from HPI   Musculoskeletal:       Per HPI, otherwise negative  Skin: Negative.   Neurological: Positive for weakness and light-headedness. Negative for syncope.     Physical Exam Updated Vital Signs BP 123/85   Pulse 69   Temp 97.6 F (36.4 C) (Oral)   Resp (!) 21   Ht 5\' 2"  (1.575 m)   Wt 89.8 kg   SpO2 95%   BMI 36.21 kg/m   Physical Exam Vitals signs and nursing note reviewed.  Constitutional:      General: She is not in acute distress.    Appearance: She is obese. She is ill-appearing.  HENT:     Head: Normocephalic and atraumatic.  Eyes:     Conjunctiva/sclera: Conjunctivae normal.  Cardiovascular:     Rate and Rhythm: Normal rate. Rhythm irregular.  Pulmonary:     Effort: Tachypnea present.     Breath sounds: Decreased breath sounds present. No wheezing.  Abdominal:     General: There is no distension.  Musculoskeletal:       Arms:  Skin:    General: Skin is warm and dry.  Neurological:     Mental Status: She is alert and oriented to person, place, and time.     Cranial Nerves: No cranial nerve deficit.     Motor: Atrophy present.  Psychiatric:     Comments: Slightly slow to respond, anxious      ED Treatments / Results  Labs (all labs ordered are listed, but only abnormal results are displayed) Labs Reviewed  COMPREHENSIVE METABOLIC PANEL - Abnormal; Notable for the following components:      Result Value   Glucose, Bld 184 (*)    Calcium 8.8 (*)    All other components within normal limits  BRAIN NATRIURETIC PEPTIDE - Abnormal; Notable for the following components:   B Natriuretic Peptide 202.6 (*)    All other components within normal limits  CBC WITH DIFFERENTIAL/PLATELET  URINALYSIS, ROUTINE W REFLEX MICROSCOPIC    EKG EKG Interpretation   Date/Time:  Friday January 21 2019 11:54:38 EDT Ventricular Rate:  70 PR Interval:    QRS Duration: 87 QT Interval:  409 QTC Calculation: 442 R Axis:   -34 Text Interpretation:  Atrial fibrillation Left axis deviation Low voltage, precordial leads Baseline wander Artifact Abnormal ECG Confirmed by Carmin Muskrat [phone]) on 01/21/2019 11:59:22 AM   Radiology Dg Chest Port 1 View  Result Date: 01/21/2019 CLINICAL DATA:  Shortness of breath. EXAM: PORTABLE CHEST 1 VIEW COMPARISON:  Radiograph of March 06, 2018. FINDINGS: Stable cardiomegaly. No pneumothorax is noted. Right lung is clear. Mild left basilar opacity is noted concerning for atelectasis or infiltrate with associated pleural effusion. Bony thorax is unremarkable. IMPRESSION: Mild left basilar atelectasis or infiltrate is noted with associated pleural effusion. Electronically Signed   By: Marijo Conception M.D.   On: 01/21/2019 10:26    Procedures Procedures (including critical care time)  Medications Ordered in ED Medications  furosemide (LASIX) injection 40 mg (has no administration in time range)     Initial Impression / Assessment and Plan / ED Course  I have reviewed the triage vital signs and the nursing notes.  Pertinent labs & imaging results that were available during my care of the patient were reviewed by me and considered in my medical decision making (see chart for details).  I also reviewed patient's EMR including documentation from earlier today, the episode prior to surgery, as well as prior cardiology notes, with ejection fraction noted to be essentially normal previously, but with noted systolic dysfunction.  Catheterization from last month as below:  Prox LAD lesion is 40% stenosed.  Mid LAD lesion is 30% stenosed.  Ost 1st Mrg to 1st Mrg lesion is 30% stenosed.  LV end diastolic pressure is moderately elevated.   1. Nonobstructive CAD with left dominant circulation 2. Moderately elevated LVEDP (post  hydration)  Coronavirus test from 2 days ago was negative.    On repeat exam the patient is in similar condition.   1:40 PM Patient's husband now present. We discussed all findings at length. He corroborates that the patient has recently stopped her Lasix and Eliquis with in preparation for her shoulder surgery. Now, the patient continues to have increased work of breathing, oxygen saturation of 87% on room air, which corrects to 95% with 2 L via nasal cannula. Patient does not use home oxygen.  On some suspicion for iatrogenic fluid overload status resulting in dyspnea, increased work of breathing. Patient noted to have bipolar disorder, and on my exam here is particularly anxious, though not acutely psychotic. I took substantial counseling, and coordination with her husband to discuss today's findings, need for admission. Patient has no fever, no leukocytosis, lower suspicion for concurrent new infection.  Line patient has received IV Lasix, and given the new oxygen requirement, will be admitted for monitoring, management, medication reconciliation.   Final Clinical Impressions(s) / ED Diagnoses   Final diagnoses:  Hypoxia     Carmin Muskrat, MD 01/21/19 1344

## 2019-01-21 NOTE — Anesthesia Preprocedure Evaluation (Addendum)
Anesthesia Evaluation  Patient identified by MRN, date of birth, ID band Patient awake    Reviewed: Allergy & Precautions, NPO status , Patient's Chart, lab work & pertinent test results  Airway Mallampati: III  TM Distance: <3 FB Neck ROM: Full    Dental  (+) Dental Advisory Given, Missing,    Pulmonary asthma ,    breath sounds clear to auscultation       Cardiovascular hypertension, Pt. on home beta blockers + CAD and +CHF  + dysrhythmias Atrial Fibrillation  Rhythm:Irregular Rate:Normal     Neuro/Psych  Headaches, PSYCHIATRIC DISORDERS Depression Bipolar Disorder  Neuromuscular disease    GI/Hepatic negative GI ROS, Neg liver ROS,   Endo/Other  diabetes, Type 2, Insulin Dependent  Renal/GU      Musculoskeletal  (+) Fibromyalgia -  Abdominal (+) + obese,   Peds  Hematology negative hematology ROS (+)   Anesthesia Other Findings   Reproductive/Obstetrics                            Anesthesia Physical Anesthesia Plan  ASA: III  Anesthesia Plan: General   Post-op Pain Management: GA combined w/ Regional for post-op pain   Induction: Intravenous  PONV Risk Score and Plan: 4 or greater and Ondansetron, Dexamethasone and Treatment may vary due to age or medical condition  Airway Management Planned: Oral ETT  Additional Equipment: None  Intra-op Plan:   Post-operative Plan: Extubation in OR  Informed Consent: I have reviewed the patients History and Physical, chart, labs and discussed the procedure including the risks, benefits and alternatives for the proposed anesthesia with the patient or authorized representative who has indicated his/her understanding and acceptance.     Dental advisory given  Plan Discussed with: CRNA  Anesthesia Plan Comments: (Pt displayed mild accessory muscle involvement during initial assessment. She stated her current status is her baseline and "my  pain and anxiety is causing my breathing". O2 sat 97%, RR 20"s. CTAB at apex, no wheezing throughout. Decision to proceed with ISB after risks discussed, including but not limited to phrenic nerve involvement, due to high risk of opioid use and subsequent respiratory compromise after surgery. 36mcg of Fentanyl administered administered for block, patient complained of increased work of breathing and O2 sats dropped to 92-94%. ISB completed after 3L [place] applied and 99% O2 sat recorded. Approx. 30 minutes after completion of the ISB, increased WOB noted, breathing treatment administered. Pt now denies pain and states her breathing has been worsening over the last few weeks. She also endorses not following up with her pulmonologist. I expressed my concern over her current status and the updated medical history. She no longer feels comfortable proceeding with the surgery. We will transfer patient to the ED for further evaluation and likely obtain a pulmonary consult.)      Anesthesia Quick Evaluation

## 2019-01-21 NOTE — Anesthesia Procedure Notes (Signed)
Anesthesia Regional Block: Interscalene brachial plexus block   Pre-Anesthetic Checklist: ,, timeout performed, Correct Patient, Correct Site, Correct Laterality, Correct Procedure, Correct Position, site marked, Risks and benefits discussed,  Surgical consent,  Pre-op evaluation,  At surgeon's request and post-op pain management  Laterality: Right  Prep: chloraprep       Needles:  Injection technique: Single-shot  Needle Type: Echogenic Stimulator Needle     Needle Length: 9cm  Needle Gauge: 21     Additional Needles:   Procedures:,,,, ultrasound used (permanent image in chart),,,,  Narrative:  Start time: 01/21/2019 9:25 AM End time: 01/21/2019 9:35 AM Injection made incrementally with aspirations every 5 mL.  Performed by: Personally  Anesthesiologist: Effie Berkshire, MD  Additional Notes: Patient tolerated the procedure well. Local anesthetic introduced in an incremental fashion under minimal resistance after negative aspirations. No paresthesias were elicited. After completion of the procedure, no acute issues were identified and patient continued to be monitored by RN.

## 2019-01-21 NOTE — ED Notes (Signed)
Husband at bedside.  

## 2019-01-21 NOTE — Plan of Care (Signed)

## 2019-01-21 NOTE — ED Triage Notes (Signed)
Pt to ED via Short Stay by nursing staff. Pt was scheduled today for right shoulder repair. Pt was given intrascalene block around 0930 am, pt then became more confused and SHOB, pt was given albuterol treatment with little relief, while in short stay pt had chest xray which shows pleural effusion. Per short stay surgery not appropriate at this time, pt brought to ED for further evaluation. On assessment pt is alert and oriented x 4, but is slow to answer questions and informs nurse she feels confused. Hx AFIB, DM, CHF

## 2019-01-21 NOTE — Progress Notes (Signed)
Contacted orthopedic surgeon on call for Dr. Veverly Fells regarding [facility], awaiting call back. Eliquis for P. Afib on hold pre-procedure.   Defer to orthopedic surgery to restart [place].

## 2019-01-21 NOTE — Interval H&P Note (Signed)
History and Physical Interval Note:  01/21/2019 9:26 AM  Amber Stephenson  has presented today for surgery, with the diagnosis of Right shoulder osteoarthritis.  The various methods of treatment have been discussed with the patient and family. After consideration of risks, benefits and other options for treatment, the patient has consented to  Procedure(s) with comments: REVERSE TOTAL SHOULDER ARTHROPLASTY (Right) - intersclene block as a surgical intervention.  The patient's history has been reviewed, patient examined, no change in status, stable for surgery.  I have reviewed the patient's chart and labs.  Questions were answered to the patient's satisfaction.     Augustin Schooling

## 2019-01-22 ENCOUNTER — Inpatient Hospital Stay (HOSPITAL_COMMUNITY): Payer: Medicare Other

## 2019-01-22 DIAGNOSIS — Z91040 Latex allergy status: Secondary | ICD-10-CM | POA: Diagnosis not present

## 2019-01-22 DIAGNOSIS — Z539 Procedure and treatment not carried out, unspecified reason: Secondary | ICD-10-CM | POA: Diagnosis present

## 2019-01-22 DIAGNOSIS — Z79899 Other long term (current) drug therapy: Secondary | ICD-10-CM | POA: Diagnosis not present

## 2019-01-22 DIAGNOSIS — Z7901 Long term (current) use of anticoagulants: Secondary | ICD-10-CM | POA: Diagnosis not present

## 2019-01-22 DIAGNOSIS — Z79891 Long term (current) use of opiate analgesic: Secondary | ICD-10-CM | POA: Diagnosis not present

## 2019-01-22 DIAGNOSIS — M549 Dorsalgia, unspecified: Secondary | ICD-10-CM | POA: Diagnosis present

## 2019-01-22 DIAGNOSIS — I351 Nonrheumatic aortic (valve) insufficiency: Secondary | ICD-10-CM

## 2019-01-22 DIAGNOSIS — I110 Hypertensive heart disease with heart failure: Secondary | ICD-10-CM | POA: Diagnosis present

## 2019-01-22 DIAGNOSIS — Z91041 Radiographic dye allergy status: Secondary | ICD-10-CM | POA: Diagnosis not present

## 2019-01-22 DIAGNOSIS — I5033 Acute on chronic diastolic (congestive) heart failure: Secondary | ICD-10-CM | POA: Diagnosis present

## 2019-01-22 DIAGNOSIS — F419 Anxiety disorder, unspecified: Secondary | ICD-10-CM | POA: Diagnosis present

## 2019-01-22 DIAGNOSIS — G8929 Other chronic pain: Secondary | ICD-10-CM | POA: Diagnosis present

## 2019-01-22 DIAGNOSIS — Z794 Long term (current) use of insulin: Secondary | ICD-10-CM | POA: Diagnosis not present

## 2019-01-22 DIAGNOSIS — E119 Type 2 diabetes mellitus without complications: Secondary | ICD-10-CM | POA: Diagnosis present

## 2019-01-22 DIAGNOSIS — M797 Fibromyalgia: Secondary | ICD-10-CM | POA: Diagnosis present

## 2019-01-22 DIAGNOSIS — J9621 Acute and chronic respiratory failure with hypoxia: Secondary | ICD-10-CM

## 2019-01-22 DIAGNOSIS — M19011 Primary osteoarthritis, right shoulder: Secondary | ICD-10-CM | POA: Diagnosis present

## 2019-01-22 DIAGNOSIS — Z20828 Contact with and (suspected) exposure to other viral communicable diseases: Secondary | ICD-10-CM | POA: Diagnosis present

## 2019-01-22 DIAGNOSIS — I2510 Atherosclerotic heart disease of native coronary artery without angina pectoris: Secondary | ICD-10-CM | POA: Diagnosis present

## 2019-01-22 DIAGNOSIS — G43909 Migraine, unspecified, not intractable, without status migrainosus: Secondary | ICD-10-CM | POA: Diagnosis present

## 2019-01-22 DIAGNOSIS — R0902 Hypoxemia: Secondary | ICD-10-CM | POA: Diagnosis not present

## 2019-01-22 DIAGNOSIS — R0602 Shortness of breath: Secondary | ICD-10-CM | POA: Diagnosis not present

## 2019-01-22 DIAGNOSIS — I482 Chronic atrial fibrillation, unspecified: Secondary | ICD-10-CM | POA: Diagnosis present

## 2019-01-22 DIAGNOSIS — J9811 Atelectasis: Secondary | ICD-10-CM | POA: Diagnosis present

## 2019-01-22 DIAGNOSIS — F319 Bipolar disorder, unspecified: Secondary | ICD-10-CM | POA: Diagnosis present

## 2019-01-22 DIAGNOSIS — E785 Hyperlipidemia, unspecified: Secondary | ICD-10-CM | POA: Diagnosis present

## 2019-01-22 DIAGNOSIS — I272 Pulmonary hypertension, unspecified: Secondary | ICD-10-CM | POA: Diagnosis present

## 2019-01-22 HISTORY — DX: Acute and chronic respiratory failure with hypoxia: J96.21

## 2019-01-22 LAB — BASIC METABOLIC PANEL
Anion gap: 13 (ref 5–15)
BUN: 24 mg/dL — ABNORMAL HIGH (ref 8–23)
CO2: 25 mmol/L (ref 22–32)
Calcium: 9.4 mg/dL (ref 8.9–10.3)
Chloride: 101 mmol/L (ref 98–111)
Creatinine, Ser: 0.86 mg/dL (ref 0.44–1.00)
GFR calc Af Amer: >60 mL/min (ref >60)
GFR calc non Af Amer: >60 mL/min (ref >60)
Glucose, Bld: 328 mg/dL — ABNORMAL HIGH (ref 70–99)
Potassium: 3.9 mmol/L (ref 3.5–5.1)
Sodium: 139 mmol/L (ref 135–145)

## 2019-01-22 LAB — GLUCOSE, CAPILLARY
Glucose-Capillary: 373 mg/dL — ABNORMAL HIGH (ref 70–99)
Glucose-Capillary: 322 mg/dL — ABNORMAL HIGH (ref 70–99)
Glucose-Capillary: 258 mg/dL — ABNORMAL HIGH (ref 70–99)
Glucose-Capillary: 240 mg/dL — ABNORMAL HIGH (ref 70–99)

## 2019-01-22 LAB — ECHOCARDIOGRAM COMPLETE
Height: 62 in
Weight: 3216.95 oz

## 2019-01-22 LAB — MAGNESIUM: Magnesium: 2 mg/dL (ref 1.7–2.4)

## 2019-01-22 MED ORDER — DULAGLUTIDE 1.5 MG/0.5ML ~~LOC~~ SOAJ: 1.5000 mg | SUBCUTANEOUS | Status: DC

## 2019-01-22 MED ORDER — INSULIN ASPART 100 UNIT/ML ~~LOC~~ SOLN
0.0000 [IU] | Freq: Every day | SUBCUTANEOUS | Status: DC
  Administered 2019-01-22: 2 [IU] via SUBCUTANEOUS

## 2019-01-22 MED ORDER — INSULIN ASPART 100 UNIT/ML ~~LOC~~ SOLN
0.0000 [IU] | Freq: Three times a day (TID) | SUBCUTANEOUS | Status: DC
  Administered 2019-01-22: 7 [IU] via SUBCUTANEOUS
  Administered 2019-01-22: 16:00:00 5 [IU] via SUBCUTANEOUS
  Administered 2019-01-22: 09:00:00 9 [IU] via SUBCUTANEOUS
  Administered 2019-01-23: 3 [IU] via SUBCUTANEOUS

## 2019-01-22 MED ORDER — ICOSAPENT ETHYL 1 G PO CAPS: 2.0000 g | ORAL_CAPSULE | Freq: Two times a day (BID) | ORAL | Status: DC

## 2019-01-22 MED ORDER — SUMATRIPTAN SUCCINATE 25 MG PO TABS
25.0000 mg | ORAL_TABLET | Freq: Once | ORAL | Status: AC
  Administered 2019-01-23: 05:00:00 25 mg via ORAL
  Filled 2019-01-22: qty 1

## 2019-01-22 MED ORDER — INSULIN GLARGINE 100 UNIT/ML ~~LOC~~ SOLN
30.0000 [IU] | Freq: Two times a day (BID) | SUBCUTANEOUS | Status: DC
  Administered 2019-01-22 – 2019-01-23 (×3): 30 [IU] via SUBCUTANEOUS
  Filled 2019-01-22 (×4): qty 0.3

## 2019-01-22 MED ORDER — APIXABAN 5 MG PO TABS
5.0000 mg | ORAL_TABLET | Freq: Two times a day (BID) | ORAL | Status: DC
  Administered 2019-01-22 – 2019-01-23 (×3): 5 mg via ORAL
  Filled 2019-01-22 (×3): qty 1

## 2019-01-22 MED ORDER — INSULIN GLARGINE 100 UNIT/ML SOLOSTAR PEN: 30.0000 [IU] | PEN_INJECTOR | Freq: Two times a day (BID) | SUBCUTANEOUS | Status: DC

## 2019-01-22 MED ORDER — OMEGA-3-ACID ETHYL ESTERS 1 G PO CAPS
2.0000 g | ORAL_CAPSULE | Freq: Two times a day (BID) | ORAL | Status: DC
  Administered 2019-01-22 – 2019-01-23 (×3): 2 g via ORAL
  Filled 2019-01-22 (×3): qty 2

## 2019-01-22 MED ORDER — ZOLPIDEM TARTRATE 5 MG PO TABS
5.0000 mg | ORAL_TABLET | Freq: Once | ORAL | Status: AC
  Administered 2019-01-22: 5 mg via ORAL
  Filled 2019-01-22: qty 1

## 2019-01-22 MED ORDER — SUMATRIPTAN SUCCINATE 50 MG PO TABS
100.0000 mg | ORAL_TABLET | Freq: Every day | ORAL | Status: DC | PRN
  Administered 2019-01-22: 100 mg via ORAL
  Filled 2019-01-22 (×2): qty 2

## 2019-01-22 NOTE — Progress Notes (Signed)
CBG checked before Lantus administration and before pt ate breakfast. CBG currently 373. No SSI Novolog or AC/HS CBGs ordered. MD paged to be aware and request orders.

## 2019-01-22 NOTE — Progress Notes (Signed)
*  PRELIMINARY RESULTS* Echocardiogram 2D Echocardiogram has been performed.  Leavy Cella 01/22/2019, 4:28 PM

## 2019-01-22 NOTE — Progress Notes (Signed)
PT Note  Patient Details Name: LAQUAISHA PORTUONDO MRN: [id number] DOB: 12/21/1945      Eval completed full write up to follow. Pt ambulated in room with assistance , some SOB and pt stating " my chest feels funny". Tried to get her to describe this sensation better, at times she says loose and then scratchy, sounds very tight in her breathing. On 3L NCO2 ( was on no Oxygen at home). Pt between 92 -94% with supine and sitting, after ambulation and in recliner now up to 96-97% still on 3 L.   If time allows, may return in the afternoon to further her ambulation into the hall if possible.    Clide Dales 01/22/2019, 1:16 PM  Clide Dales, PT Acute Rehabilitation Services Pager: [phone] Office: [phone] 01/22/2019

## 2019-01-22 NOTE — Evaluation (Signed)
Physical Therapy Evaluation Patient Details Name: Amber Stephenson MRN: [id number] DOB: July 19, 1945 Today's Date: 01/22/2019   History of Present Illness  pt was coming in for Shoulder surgery , however when shoulder block was in place pt had difficulty with breathing. shoulder surgery was not performed, now on 3L 02 , SOB .  Clinical Impression   Pt ambulated in room with assistance , some SOB and pt stating " my chest feels funny". Tried to get her to describe this sensation better, at times she says loose and then scratchy, sounds very tight in her breathing. On 3L NCO2 ( was on no Oxygen at home). Pt between 92 -94% with supine and sitting, after ambulation and in recliner now up to 96-97% still on 3 L. Will continue to follow while here in acute care to help address decreased activity tolerance and mobility.       Follow Up Recommendations Home health PT(depends on pt's progress, may be able to progress herself at home.)    Equipment Recommendations  None recommended by PT    Recommendations for Other Services       Precautions / Restrictions Precautions Precautions: None      Mobility  Bed Mobility Overal bed mobility: Needs Assistance Bed Mobility: Supine to Sit;Sit to Supine     Supine to sit: Mod assist Sit to supine: Mod assist   General bed mobility comments: Moda for upper body due to shoulder and trunk stiffness  Transfers Overall transfer level: Needs assistance Equipment used: None;1 person hand held assist Transfers: Sit to/from [facility] Sit to Stand: Min assist Stand pivot transfers: Min assist       General transfer comment: Assist and cues for safety  Ambulation/Gait Ambulation/Gait assistance: Min assist;+2 safety/equipment Gait Distance (Feet): 20 Feet Assistive device: 1 person hand held assist Gait Pattern/deviations: Step-through pattern     General Gait Details: slightly unsteady however pt had to sit on BSC and walk  in room with PT navigating O2 tank in very small room. Pt still with some SOB and drop in )2 sats with ambulation. Sat for lunch and may try to ambulate again after lunch.  Stairs            Wheelchair Mobility    Modified Rankin (Stroke Patients Only)       Balance Overall balance assessment: Mild deficits observed, not formally tested;Needs assistance Sitting-balance support: Bilateral upper extremity supported Sitting balance-Leahy Scale: Good     Standing balance support: Single extremity supported Standing balance-Leahy Scale: Fair                               Pertinent Vitals/Pain Pain Assessment: 0-10 Pain Score: 3  Pain Location: shoulder pain at times Pain Descriptors / Indicators: Aching Pain Intervention(s): Monitored during session    Home Living Family/patient expects to be discharged to:: Private residence Living Arrangements: Spouse/significant other Available Help at Discharge: Family Type of Home: House Home Access: Stairs to enter Entrance Stairs-Rails: Left Entrance Stairs-Number of Steps: 2 Home Layout: Two level;Full bath on main level;Able to live on main level with bedroom/bathroom Home Equipment: Shower seat - built in      Prior Function Level of Independence: Independent         Comments: having trouble with R shoulder though, and limited with walking distance due to SOB     Hand Dominance        Extremity/Trunk Assessment  Lower Extremity Assessment Lower Extremity Assessment: Generalized weakness(fatigues easily)       Communication   Communication: No difficulties;Other (comment)(however flat affect adn answers wtih very little detail, 1 word answers at a time.)  Cognition Arousal/Alertness: Awake/alert Behavior During Therapy: Flat affect Overall Cognitive Status: Within Functional Limits for tasks assessed                                        General Comments       Exercises     Assessment/Plan    PT Assessment Patient needs continued PT services  PT Problem List Decreased activity tolerance;Decreased mobility;Decreased balance       PT Treatment Interventions Gait training;Functional mobility training;Therapeutic activities;Therapeutic exercise    PT Goals (Current goals can be found in the Care Plan section)  Acute Rehab PT Goals Patient Stated Goal: "I want to get better." Pt with very few words and flat affect. PT Goal Formulation: With patient/family Time For Goal Achievement: 02/04/19 Potential to Achieve Goals: Good    Frequency Min 3X/week   Barriers to discharge        Co-evaluation               AM-PAC PT "6 Clicks" Mobility  Outcome Measure Help needed turning from your back to your side while in a flat bed without using bedrails?: A Little Help needed moving from lying on your back to sitting on the side of a flat bed without using bedrails?: A Little Help needed moving to and from a bed to a chair (including a wheelchair)?: A Little Help needed standing up from a chair using your arms (e.g., wheelchair or bedside chair)?: A Little Help needed to walk in hospital room?: A Little Help needed climbing 3-5 steps with a railing? : A Little 6 Click Score: 18    End of Session Equipment Utilized During Treatment: Gait belt Activity Tolerance: Patient tolerated treatment well Patient left: in chair Nurse Communication: Mobility status PT Visit Diagnosis: Other abnormalities of gait and mobility (R26.89);Unsteadiness on feet (R26.81)    Time: 1200-1230 PT Time Calculation (min) (ACUTE ONLY): 30 min   Charges:   PT Evaluation $PT Eval Low Complexity: 1 Low PT Treatments $Therapeutic Activity: 8-22 mins        Clide Dales, PT Acute Rehabilitation Services Pager: [phone] Office: [phone] 01/22/2019   Clide Dales 01/22/2019, 3:01 PM

## 2019-01-22 NOTE — Progress Notes (Signed)
Physical Therapy Treatment Patient Details Name: Amber Stephenson MRN: [id number] DOB: [birth year] Today's Date: 01/22/2019    History of Present Illness pt was coming in for Shoulder surgery , however when shoulder block was in place pt had difficulty with breathing. shoulder surgery was not performed, now on 3L 02 , SOB .    PT Comments    Pt was not able to walk much this morning, so walked pt this afternoon on 2L O2 at 92-94%. Tolerated well, slow, guarded HHA and pt c/o chest tightness and fatigue after this little bit. Husband in room and states her baseline is much better than this.    Follow Up Recommendations  Home health PT(depends on pt's progress, may be able to progress herself at home.)     Equipment Recommendations  None recommended by PT    Recommendations for Other Services       Precautions / Restrictions Precautions Precautions: None    Mobility  Bed Mobility Overal bed mobility: Needs Assistance Bed Mobility: Supine to Sit     Supine to sit: Min assist Sit to supine: Mod assist   General bed mobility comments: MinA for upper body  Transfers Overall transfer level: Needs assistance Equipment used: 1 person hand held assist Transfers: Sit to/from Stand Sit to Stand: Min guard Stand pivot transfers: Min assist       General transfer comment: Assist and cues for safety  Ambulation/Gait Ambulation/Gait assistance: Min assist Gait Distance (Feet): 80 Feet Assistive device: 2 person hand held assist Gait Pattern/deviations: Step-through pattern     General Gait Details: better this afternoon, slow steday , at times c/o chest tightness/coughing as well.   Stairs             Wheelchair Mobility    Modified Rankin (Stroke Patients Only)       Balance Overall balance assessment: Mild deficits observed, not formally tested;Needs assistance Sitting-balance support: Bilateral upper extremity supported Sitting balance-Leahy Scale: Good     Standing balance support: Single extremity supported Standing balance-Leahy Scale: Fair                              Cognition Arousal/Alertness: Awake/alert Behavior During Therapy: Flat affect Overall Cognitive Status: Within Functional Limits for tasks assessed                                        Exercises      General Comments        Pertinent Vitals/Pain Pain Assessment: No/denies pain Pain Score: 3  Pain Location: shoulder pain at times Pain Descriptors / Indicators: Aching Pain Intervention(s): Monitored during session    Home Living Family/patient expects to be discharged to:: Private residence Living Arrangements: Spouse/significant other Available Help at Discharge: Family Type of Home: House Home Access: Stairs to enter Entrance Stairs-Rails: Left Home Layout: Two level;Full bath on main level;Able to live on main level with bedroom/bathroom Home Equipment: Shower seat - built in      Prior Function Level of Independence: Independent      Comments: having trouble with R shoulder though, and limited with walking distance due to SOB   PT Goals (current goals can now be found in the care plan section) Acute Rehab PT Goals Patient Stated Goal: "I want to get better." Pt with very few words and flat affect.  PT Goal Formulation: With patient/family Time For Goal Achievement: 02/04/19 Potential to Achieve Goals: Good    Frequency    Min 3X/week      PT Plan      Co-evaluation              AM-PAC PT "6 Clicks" Mobility   Outcome Measure  Help needed turning from your back to your side while in a flat bed without using bedrails?: A Little Help needed moving from lying on your back to sitting on the side of a flat bed without using bedrails?: A Little Help needed moving to and from a bed to a chair (including a wheelchair)?: A Little Help needed standing up from a chair using your arms (e.g., wheelchair or  bedside chair)?: A Little Help needed to walk in hospital room?: A Little Help needed climbing 3-5 steps with a railing? : A Little 6 Click Score: 18    End of Session Equipment Utilized During Treatment: Gait belt Activity Tolerance: Patient tolerated treatment well Patient left: in chair Nurse Communication: Mobility status PT Visit Diagnosis: Other abnormalities of gait and mobility (R26.89);Unsteadiness on feet (R26.81)     Time: [id number] PT Time Calculation (min) (ACUTE ONLY): 17 min  Charges:  $Gait Training: 8-22 mins $Therapeutic Activity: 8-22 mins                     Clide Dales, PT Acute Rehabilitation Services Pager: [phone] Office: [phone] 01/22/2019    Clide Dales 01/22/2019, 4:52 PM

## 2019-01-22 NOTE — Progress Notes (Signed)
Orthopedics Progress Note  Subjective: Patient feeling some better this morning but still somewhat short of breath.  Objective:  Vitals:   01/22/19 0748 01/22/19 0749  BP:    Pulse:    Resp:    Temp:    SpO2: 94% 95%    General: Awake and alert  Musculoskeletal: receiving a breathing treatment. Appears to be working at respirations with accentuated chest rise.  Shoulder non tender Neurovascularly intact  Lab Results  Component Value Date   WBC 9.3 01/21/2019   HGB 13.8 01/21/2019   HCT 42.6 01/21/2019   MCV 93.2 01/21/2019   PLT 158 01/21/2019       Component Value Date/Time   NA 139 01/22/2019 0451   NA 143 04/09/2018 1612   K 3.9 01/22/2019 0451   CL 101 01/22/2019 0451   CO2 25 01/22/2019 0451   GLUCOSE 328 (H) 01/22/2019 0451   BUN 24 (H) 01/22/2019 0451   BUN 15 04/09/2018 1612   CREATININE 0.86 01/22/2019 0451   CALCIUM 9.4 01/22/2019 0451   GFRNONAA >60 01/22/2019 0451   GFRAA >60 01/22/2019 0451    Lab Results  Component Value Date   INR 1.40 05/19/2017    Assessment/Plan: Right shoulder OA, aborted attempt at Right total shoulder due to respiratory issues noted in pre-op after her regional block Sent to the ED where patient noted to have atelectasis and possibly a pleural effusion on the left. Admitted for medical management. Will need multi-disciplinary medical optimization as an outpatient prior to rescheduling her shoulder replacement. Follow up with me in two to four weeks in the office. [place] Amber Fells, MD 01/22/2019 7:53 AM

## 2019-01-22 NOTE — Progress Notes (Signed)
CRITICAL VALUE ALERT  Critical Value:  Glucose 328 in AM labs. No insulin orders  Date & Time Notied:  01-22-2019 @ [phone]  Provider Notified: Kennon Holter, NP  Orders Received/Actions taken: awaiting orders

## 2019-01-22 NOTE — Progress Notes (Addendum)
PROGRESS NOTE  Amber Stephenson  DOB: [birth year]  PCP: [name], DO [id number]  DOA: 01/21/2019  LOS: 0 days   Brief narrative: Amber Stephenson is a 73 y.o. female with medical history significant for chronic A. fib on Eliquis, coronary artery disease, chronic diastolic CHF, chronic anxiety/depression/fibromyalgia/bipolar disorder, right shoulder osteoarthritis who presented on 8/21 for right total shoulder arthroplasty.  When she got the nerve block for the procedure, patient started getting short of breath suddenly and required oxygen supplementation. The procedure was aborted and patient was sent to the ED for further evaluation and management.  In the ED, patient was afebrile, heart rate low 100s, blood pressure elevated 150s, she required 2 L oxygen by nasal cannula to maintain oxygen saturation more than 90%. Does not use oxygen at home. CBC and BMP unremarkable.   Chest x-ray showed mild left basilar atelectasis or infiltrate and associated pleural effusion. EKG showed A. fib at a rate of 72 bpm, QTC 452 ms, no ST-T wave changes. Patient was given a dose of IV Lasix in the ED. Patient was admitted under hospitalist service for further evaluation and management.  Subjective: Patient was seen and examined this morning.  Pleasant elderly Caucasian female.  Very anxious.  Still remains on oxygen by nasal cannula.  Assessment/Plan: Acute dyspnea Acute respiratory with hypoxia -Started after nerve block while being prepped for right shoulder arthroplasty. -Multifactorial: Anxiety, missing a dose of Lasix on the morning of surgery, atelectasis/small pleural effusion. -Patient was given a dose of Lasix IV in the ED and is currently continued on the same. -Currently on 3 L oxygen by nasal cannula.  Wean down as tolerated.  Ambulate in the hallway. -Incentive spirometry.  Cardiovascular issues: Hypertension, diastolic congestive heart failure, CAD, A. fib on Eliquis  -Blood pressure elevated to 160s in ED.  Heart rate 90s A. fib -PTA, patient was on metoprolol, Cardizem, Lasix, Eliquis. -Continue all.   Diabetes mellitus -Hemoglobin A1c 7.7 on recent check -PTA, patient was on Trulicity, Lantus 30 units twice daily and sliding scale insulin.  Resume the same. -Continue Accu-Cheks.  Right shoulder osteoarthritis Optimize pain control  Appreciate orthopedic input Patient is to follow-up with orthopedic as an outpatient for rescheduling of her surgery.  Chronic depression/anxiety/bipolar disorder/fibromyalgia/migraine Resume home medications diarrhea.  Which include Eszopiclone, trazodone, Abilify, Tegretol, Ativan, Lyrica, Maxalt, Topamax. -Per pharmacy, Tegretol and Eliquis interact to reduce the level of Eliquis.  Patient needs to follow-up with her primary care provider/neurologist for an alternative choice.   Body mass index is 36.77 kg/m. Mobility: Encourage ambulation on the hallway today Diet: cardiac diet DVT prophylaxis:  Eliquis Code Status:   Code Status: Full Code  Family Communication:  Expected Discharge:  Anticipate discharge home in next 1 to 2 days.  Consultants:  Urology  Procedures:    Antimicrobials: Anti-infectives (From admission, onward)   None      Infusions:    Scheduled Meds: . apixaban  5 mg Oral BID  . ARIPiprazole  30 mg Oral Daily  . atorvastatin  40 mg Oral Daily  . carbamazepine  100 mg Oral Daily  . cholecalciferol  1,000 Units Oral Daily  . diltiazem  120 mg Oral QHS  . [START ON 01/23/2019] Dulaglutide  1.5 mg Subcutaneous Weekly  . furosemide  40 mg Intravenous Daily  . insulin aspart  0-5 Units Subcutaneous QHS  . insulin aspart  0-9 Units Subcutaneous TID WC  . insulin glargine  30 Units Subcutaneous BID  .  ipratropium-albuterol  3 mL Nebulization TID  . LORazepam  1 mg Oral Daily  . LORazepam  2 mg Oral QHS  . metoprolol tartrate  125 mg Oral BID  . omega-3 acid ethyl esters  2 g  Oral BID  . pregabalin  150 mg Oral BID  . topiramate  25 mg Oral Daily  . topiramate  75 mg Oral QHS    PRN meds: acetaminophen, ketorolac, metoprolol tartrate, polyvinyl alcohol, SUMAtriptan   Objective: Vitals:   01/22/19 0749 01/22/19 1032  BP:  136/76  Pulse:  94  Resp:  (!) 23  Temp:  98.1 F (36.7 C)  SpO2: 95% 94%    Intake/Output Summary (Last 24 hours) at 01/22/2019 1239 Last data filed at 01/22/2019 0920 Gross per 24 hour  Intake 240 ml  Output 2200 ml  Net -1960 ml   Filed Weights   01/21/19 1112 01/21/19 1728 01/22/19 0600  Weight: 89.8 kg 89.7 kg 91.2 kg   Weight change:  Body mass index is 36.77 kg/m.   Physical Exam: General exam: Appears calm and comfortable. Not in distress Skin: No rashes, lesions or ulcers. HEENT: Atraumatic, normocephalic, supple neck, no obvious bleeding Lungs: CTAB CVS: Rate controlled A. Fib, no murmur GI/Abd soft, nontender, nondistended, bowel sound present CNS: Alert, awake, oriented 3 Psychiatry: Mood appropriate Extremities: No edema, no calf tenderness  Data Review: I have personally reviewed the laboratory data and studies available.  Recent Labs  Lab 01/18/19 1454 01/21/19 1156  WBC 10.5 9.3  NEUTROABS  --  5.7  HGB 14.5 13.8  HCT 44.9 42.6  MCV 93.2 93.2  PLT 181 158   Recent Labs  Lab 01/18/19 1454 01/21/19 1156 01/22/19 0451  NA 141 140 139  K 4.3 3.9 3.9  CL 107 108 101  CO2 27 24 25   GLUCOSE 216* 184* 328*  BUN 18 19 24*  CREATININE 0.88 0.66 0.86  CALCIUM 9.3 8.8* 9.4  MG  --   --  2.0    Terrilee Croak, MD  [facility] 01/22/2019

## 2019-01-23 ENCOUNTER — Inpatient Hospital Stay (HOSPITAL_COMMUNITY): Payer: Medicare Other

## 2019-01-23 LAB — GLUCOSE, CAPILLARY: Glucose-Capillary: 209 mg/dL — ABNORMAL HIGH (ref 70–99)

## 2019-01-23 NOTE — Progress Notes (Signed)
CRITICAL VALUE ALERT  Critical Value: 2.09 sec pause per tele. Hx Afib. NAD  Date & Time Notied: 01-23-2019 @ 0036  Provider Notified: Kennon Holter, NP  Orders Received/Actions taken: No new orders

## 2019-01-23 NOTE — Evaluation (Addendum)
Occupational Therapy Evaluation Patient Details Name: Amber Stephenson MRN: [id number] DOB: 1946/01/05 Today's Date: 01/23/2019    History of Present Illness pt was coming in for Shoulder surgery , however when shoulder block was in place pt had difficulty with breathing. shoulder surgery was not performed, now on 3L 02 , SOB .   Clinical Impression   This 73 y/o female presents with the above. PTA pt reports independence with ADL and functional mobility. Pt currently requiring minA (HHA) for room level functional mobility. Requiring minguard assist for toileting, minA for LB ADL and setup assist for seated grooming and UB ADL. Pt with dizziness this session after toileting task, with vitals monitored and VSS on RA, pt reports symptoms subsiding with seated rest. Pt reports plans to return home with spouse assist for ADL/iADL needs. She will benefit from continued acute OT services to further progress pt towards PLOF. Will follow.     Follow Up Recommendations  No OT follow up;Supervision/Assistance - 24 hour    Equipment Recommendations  None recommended by OT           Precautions / Restrictions Precautions Precautions: None Restrictions Weight Bearing Restrictions: No      Mobility Bed Mobility               General bed mobility comments: received OOB in recliner  Transfers Overall transfer level: Needs assistance Equipment used: None;1 person hand held assist Transfers: Sit to/from Stand Sit to Stand: Min guard         General transfer comment: Assist and cues for safety    Balance Overall balance assessment: Mild deficits observed, not formally tested;Needs assistance Sitting-balance support: Bilateral upper extremity supported Sitting balance-Leahy Scale: Good     Standing balance support: Single extremity supported Standing balance-Leahy Scale: Fair                             ADL either performed or assessed with clinical judgement    ADL Overall ADL's : Needs assistance/impaired Eating/Feeding: Independent;Sitting   Grooming: Set up;Sitting;Wash/dry hands Grooming Details (indicate cue type and reason): performed in sitting due to dizziness while standing Upper Body Bathing: Min guard;Sitting   Lower Body Bathing: Min guard;Sit to/from stand   Upper Body Dressing : Set up;Sitting   Lower Body Dressing: Min guard;Sit to/from stand   Toilet Transfer: Minimal assistance;Ambulation;Regular Glass blower/designer Details (indicate cue type and reason): use of HHA Toileting- Clothing Manipulation and Hygiene: Min guard;Sit to/from stand;Sitting/lateral lean Toileting - Clothing Manipulation Details (indicate cue type and reason): pt performing peri-care and gown management with minguard for safety     Functional mobility during ADLs: Minimal assistance(HHA) General ADL Comments: pt limited this session due to dizziness after completion of toileting task - vitals monitored as RN made aware (VSS). pt reports dizziness subsides with seated rest.      Vision         Perception     Praxis      Pertinent Vitals/Pain Pain Assessment: No/denies pain     Hand Dominance Right   Extremity/Trunk Assessment Upper Extremity Assessment Upper Extremity Assessment: RUE deficits/detail;Generalized weakness RUE Deficits / Details: grossly 3+/5 though limited shoulder ROM (approx 0-100*)   Lower Extremity Assessment Lower Extremity Assessment: Defer to PT evaluation       Communication Communication Communication: No difficulties   Cognition Arousal/Alertness: Awake/alert Behavior During Therapy: Flat affect Overall Cognitive Status: Within Functional Limits for tasks  assessed                                 General Comments: for basic functional tasks   General Comments  pt reports increased dizziness wtih mobility, BP taken after return to sitting and 132/92; O2 sats fluctuating 88% and above,  max HR noted 110     Exercises     Shoulder Instructions      Home Living Family/patient expects to be discharged to:: Private residence Living Arrangements: Spouse/significant other Available Help at Discharge: Family Type of Home: House Home Access: Stairs to enter Technical brewer of Steps: 2 Entrance Stairs-Rails: Left Home Layout: Two level;Full bath on main level;Able to live on main level with bedroom/bathroom     Bathroom Shower/Tub: Occupational psychologist: Standard Bathroom Accessibility: Yes   Home Equipment: Shower seat - built in          Prior Functioning/Environment Level of Independence: Independent        Comments: having trouble with R shoulder though, and limited with walking distance due to SOB        OT Problem List: Decreased strength;Decreased activity tolerance;Impaired balance (sitting and/or standing);Impaired UE functional use;Obesity;Cardiopulmonary status limiting activity;Decreased range of motion      OT Treatment/Interventions: Self-care/ADL training;Therapeutic exercise;Energy conservation;DME and/or AE instruction;Therapeutic activities;Patient/family education;Balance training    OT Goals(Current goals can be found in the care plan section) Acute Rehab OT Goals Patient Stated Goal: home today OT Goal Formulation: With patient Time For Goal Achievement: 02/06/19 Potential to Achieve Goals: Good  OT Frequency: Min 2X/week   Barriers to D/C:            Co-evaluation              AM-PAC OT "6 Clicks" Daily Activity     Outcome Measure Help from another person eating meals?: None Help from another person taking care of personal grooming?: None Help from another person toileting, which includes using toliet, bedpan, or urinal?: None Help from another person bathing (including washing, rinsing, drying)?: A Little Help from another person to put on and taking off regular upper body clothing?: None Help from  another person to put on and taking off regular lower body clothing?: A Little 6 Click Score: 22   End of Session Nurse Communication: Mobility status  Activity Tolerance: Patient tolerated treatment well Patient left: in chair;with call bell/phone within reach;with family/visitor present  OT Visit Diagnosis: Unsteadiness on feet (R26.81);Muscle weakness (generalized) (M62.81)                Time: 1000-1021 OT Time Calculation (min): 21 min Charges:  OT General Charges $OT Visit: 1 Visit OT Evaluation $OT Eval Moderate Complexity: 1 Mod  Lou Cal, OT [employer] Pager [phone] Office [phone]    Raymondo Band 01/23/2019, 1:21 PM

## 2019-01-23 NOTE — Progress Notes (Signed)
D/c instructions given. Pt and husband verbalized understanding of d.c instruction. Condition stable at time of d.c

## 2019-01-23 NOTE — Progress Notes (Signed)
SATURATION QUALIFICATIONS: (This note is used to comply with regulatory documentation for home oxygen)  Patient Saturations on Room Air at Rest = 95%  Patient Saturations on Room Air while Ambulating = 89-92%  Pt ambulated 180 feet in hallway with standby assist. Pt tolerated well- was able to talk while walking. O2 Sats dropped to 89% RA while walking, but quickly increased back to 91-92%. MD Akula aware.

## 2019-01-25 ENCOUNTER — Ambulatory Visit: Payer: Medicare Other | Admitting: Neurology

## 2019-01-25 NOTE — Discharge Summary (Signed)
Physician Discharge Summary  Amber Stephenson RVU:[id number] DOB: 11/10/1945 DOA: 01/21/2019  PCP: [name], DO  Admit date: 01/21/2019 Discharge date: 01/23/2019  Admitted From: Home.  Disposition:  Home.   Recommendations for Outpatient Follow-up:  1. Follow up with PCP in 1-2 weeks 2. Please obtain BMP/CBC in one week 3. Please follow up with cardiologist Dr Bronson Ing in one week.  4. Please follow up with orthopedics as recommended.    Discharge Condition:stable.  CODE STATUS full code.  Diet recommendation: Heart Healthy   Brief/Interim Summary: Amber Stephenson a 73 y.o.femalewith medical history significant forchronic A. fib on Eliquis, coronary artery disease, chronic diastolic CHF, chronic anxiety/depression/fibromyalgia/bipolar disorder, right shoulder osteoarthritis who presented on 8/21 for right total shoulder arthroplasty.  When she got the nerve block for the procedure, patient started getting short of breath suddenly and required oxygen supplementation. The procedure was aborted and patient was sent to the ED for further evaluation and management.   Chest x-ray showed mild left basilar atelectasis or infiltrate and associated pleural effusion. EKG showed A. fib at a rate of 72 bpm, QTC 452 ms, no ST-T wave changes. Patient was given a dose of IV Lasix in the ED. Patient was admitted under hospitalist service for further evaluation and management.  Discharge Diagnoses:  Active Problems:   Acute on chronic respiratory failure with hypoxia (HCC)  Acute respiratory with hypoxia probably secondary to fluid overload / mild acute on chronic diastolic heart failure -Started after nerve block while being prepped for right shoulder arthroplasty. -Multifactorial: Anxiety, missing a dose of Lasix on the morning of surgery, atelectasis/small pleural effusion. -Patient was given a dose of Lasix IV in the ED and is currently continued on the same for 48 hours, she  was diuresed appropriately and 3 lit out since admission.  Echocardiogram ordered and showed The left ventricle has normal systolic function, with an ejection fraction of 55-60%. The cavity size was normal. There is mildly increased left ventricular wall thickness. Left ventricular diastolic function could not be evaluated secondary to  atrial fibrillation.  She was also weaned off oxygen on discharge. She was resumed on lasix on discharge.     Cardiovascular issues: Hypertension, diastolic congestive heart failure, CAD, A. fib on Eliquis  well controlled BP.  -PTA, patient was on metoprolol, Cardizem, Lasix, Eliquis. -Continue all.   Diabetes mellitus -Hemoglobin A1c 7.7 on recent check -PTA, patient was on Trulicity, Lantus 30 units twice daily and sliding scale insulin.  Resume the same.   Right shoulder osteoarthritis Optimize pain control  Appreciate orthopedic input Patient is to follow-up with orthopedic as an outpatient for rescheduling of her surgery.  Chronic depression/anxiety/bipolar disorder/fibromyalgia/migraine Resume home medications   Which include Eszopiclone, trazodone, Abilify, Tegretol, Ativan, Lyrica, Maxalt, Topamax. -Per pharmacy, Tegretol and Eliquis interact to reduce the level of Eliquis.  Patient needs to follow-up with her primary care provider/neurologist for an alternative choice.  Discharge Instructions  Discharge Instructions    Diet - low sodium heart healthy   Complete by: As directed    Discharge instructions   Complete by: As directed    Follow up with PCP and cardiologist in one week.     Allergies as of 01/23/2019      Reactions   Ivp Dye [iodinated Diagnostic Agents] Anaphylaxis, Swelling   Throat closes   Latex    Latex tape pulls skin with it      Medication List    STOP taking these medications  oxyCODONE-acetaminophen 5-325 MG tablet Commonly known as: PERCOCET/ROXICET     TAKE these medications   acetaminophen 325 MG  tablet Commonly known as: TYLENOL Take 325-650 mg by mouth every 6 (six) hours as needed for mild pain, moderate pain or headache.   apixaban 5 MG Tabs tablet Commonly known as: Eliquis Take 1 tablet (5 mg total) by mouth 2 (two) times daily.   ARIPiprazole 10 MG tablet Commonly known as: ABILIFY Take 3 tablets (30 mg total) by mouth daily.   atorvastatin 40 MG tablet Commonly known as: LIPITOR TAKE 1 TABLET DAILY   budesonide-formoterol 80-4.5 MCG/ACT inhaler Commonly known as: Symbicort Inhale 2 puffs into the lungs 2 (two) times daily. What changed:   when to take this  reasons to take this   carbamazepine 100 MG 12 hr capsule Commonly known as: CARBATROL Take 1 capsule (100 mg total) by mouth daily.   diltiazem 120 MG 24 hr capsule Commonly known as: CARDIZEM CD TAKE 1 CAPSULE AT BEDTIME   Dulaglutide 1.5 MG/0.5ML Sopn Commonly known as: Trulicity Inject 1.5 mg into the skin once a week.   Eszopiclone 3 MG Tabs Commonly known as: eszopiclone Take 1 tablet (3 mg total) by mouth at bedtime as needed. Take immediately before bedtime What changed: reasons to take this   freestyle lancets Test tid. DX E11.22   FreeStyle Lite Devi Use to check blood sugar tid. DX E11.22   furosemide 20 MG tablet Commonly known as: LASIX Take 1 tablet (20 mg total) by mouth 2 (two) times daily.   glucose blood test strip Commonly known as: FREESTYLE LITE USE FOR TESTING SUGAR THREE TIMES DAILY   Insulin Glargine 100 UNIT/ML Solostar Pen Commonly known as: Lantus SoloStar INJECT 60 UNITS UNDER THE SKIN TWICE A DAY What changed:   how much to take  how to take this  when to take this  additional instructions   Insulin Pen Needle 32G X 4 MM Misc Commonly known as: Sure Comfort Pen Needles Use with insulin pens as directed. Dx E11.9   LORazepam 1 MG tablet Commonly known as: Ativan Take 1 tab (1 mg) in the morning and 2 tabs (2 mg ) at bedtime What changed:   how  much to take  how to take this  when to take this   metoprolol tartrate 25 MG tablet Commonly known as: LOPRESSOR Take 1 tablet (25 mg total) by mouth 2 (two) times daily. Take with metoprolol 100 mg to equal 125 mg What changed: additional instructions   metoprolol tartrate 100 MG tablet Commonly known as: LOPRESSOR TAKE 1 TABLET TWICE A DAY WITH METOPROLOL 25 MG TO EQUAL 125 MG What changed: See the new instructions.   Narcan 4 MG/0.1ML Liqd nasal spray kit Generic drug: naloxone Place 1 spray into the nose once as needed (opiod overdose).   nystatin powder Commonly known as: MYCOSTATIN/NYSTOP Apply topically 3 (three) times daily. x2 weeks (for groin/breast rash) What changed:   how much to take  when to take this  reasons to take this  additional instructions   polyvinyl alcohol 1.4 % ophthalmic solution Commonly known as: LIQUIFILM TEARS Place 1 drop into both eyes as needed for dry eyes.   pregabalin 150 MG capsule Commonly known as: Lyrica Take 1 capsule (150 mg total) by mouth 2 (two) times daily.   rizatriptan 10 MG disintegrating tablet Commonly known as: MAXALT-MLT DISSOLVE 1 TABLET ON THE TONGUE AT FIRST SIGN OF HEADACHE. MAY REPEAT ONE DOSE IN  1 HOUR IF NEEDED What changed:   how much to take  how to take this  when to take this   topiramate 25 MG tablet Commonly known as: TOPAMAX 1 tablet in the morning and 3 tablets every night What changed:   how much to take  how to take this  when to take this  additional instructions   traZODone 100 MG tablet Commonly known as: DESYREL 4  qhs What changed:   how much to take  how to take this  when to take this  additional instructions   Vascepa 1 g Caps Generic drug: Icosapent Ethyl Take 2 capsules (2 g total) by mouth 2 (two) times daily with a meal.   Vitamin D3 25 MCG (1000 UT) Caps Take 1,000 Units by mouth daily.      Follow-up Information    Netta Cedars, MD Follow up  in 2 week(s).   Specialty: Orthopedic Surgery Why: call for appointment in 2-4 weeks [phone] Contact information: [address] [place] [address] [phone]          Allergies  Allergen Reactions  . Ivp Dye [Iodinated Diagnostic Agents] Anaphylaxis and Swelling    Throat closes  . Latex     Latex tape pulls skin with it    Consultations:  None.    Procedures/Studies: Dg Chest Port 1 View  Result Date: 01/23/2019 CLINICAL DATA:  Patient with shortness of breath. EXAM: PORTABLE CHEST 1 VIEW COMPARISON:  Chest radiograph 01/21/2019 FINDINGS: Monitoring leads overlie the patient. Stable cardiomegaly. Low lung volumes. Bibasilar atelectasis. Improved aeration of the left lower lung. No pleural effusion or pneumothorax. IMPRESSION: Improved aeration of the left lower lung. Minimal bibasilar atelectasis. Cardiomegaly. Electronically Signed   By: Lovey Newcomer M.D.   On: 01/23/2019 11:59   Dg Chest Port 1 View  Result Date: 01/21/2019 CLINICAL DATA:  Shortness of breath. EXAM: PORTABLE CHEST 1 VIEW COMPARISON:  Radiograph of March 06, 2018. FINDINGS: Stable cardiomegaly. No pneumothorax is noted. Right lung is clear. Mild left basilar opacity is noted concerning for atelectasis or infiltrate with associated pleural effusion. Bony thorax is unremarkable. IMPRESSION: Mild left basilar atelectasis or infiltrate is noted with associated pleural effusion. Electronically Signed   By: Marijo Conception M.D.   On: 01/21/2019 10:26       Subjective: No chest pain or sob. No nausea or vomiting.   Discharge Exam: Vitals:   01/23/19 0355 01/23/19 0822  BP: 124/80   Pulse: 74   Resp: 20   Temp: 97.6 F (36.4 C)   SpO2: 95% 97%   Vitals:   01/23/19 0026 01/23/19 0100 01/23/19 0355 01/23/19 0822  BP: 112/77  124/80   Pulse: 67  74   Resp:   20   Temp:   97.6 F (36.4 C)   TempSrc:   Oral   SpO2: (!) 87% 93% 95% 97%  Weight:   89.6 kg   Height:         General: Pt is alert, awake, not in acute distress Cardiovascular: RRR, S1/S2 +, no rubs, no gallops Respiratory: CTA bilaterally, no wheezing, no rhonchi Abdominal: Soft, NT, ND, bowel sounds + Extremities: no edema, no cyanosis    The results of significant diagnostics from this hospitalization (including imaging, microbiology, ancillary and laboratory) are listed below for reference.     Microbiology: Recent Results (from the past 240 hour(s))  Surgical pcr screen     Status: None   Collection  Time: 01/18/19  2:54 PM   Specimen: Nasal Mucosa; Nasal Swab  Result Value Ref Range Status   MRSA, PCR NEGATIVE NEGATIVE Final   Staphylococcus aureus NEGATIVE NEGATIVE Final    Comment: (NOTE) The Xpert SA Assay (FDA approved for NASAL specimens in patients 79 years of age and older), is one component of a comprehensive surveillance program. It is not intended to diagnose infection nor to guide or monitor treatment. Performed at [facility], [place] [address], [place], [place] [address]   SARS CORONAVIRUS 2 Nasal Swab Aptima Multi Swab     Status: None   Collection Time: 01/19/19  9:32 AM   Specimen: Aptima Multi Swab; Nasal Swab  Result Value Ref Range Status   SARS Coronavirus 2 NEGATIVE NEGATIVE Final    Comment: (NOTE) SARS-CoV-2 target nucleic acids are NOT DETECTED. The SARS-CoV-2 RNA is generally detectable in upper and lower respiratory specimens during the acute phase of infection. Negative results do not preclude SARS-CoV-2 infection, do not rule out co-infections with other pathogens, and should not be used as the sole basis for treatment or other patient management decisions. Negative results must be combined with clinical observations, patient history, and epidemiological information. The expected result is Negative. Fact Sheet for Patients: SugarRoll.be Fact Sheet for Healthcare  Providers: https://www.woods-mathews.com/ This test is not yet approved or cleared by the [place] FDA and  has been authorized for detection and/or diagnosis of SARS-CoV-2 by FDA under an Emergency Use Authorization (EUA). This EUA will remain  in effect (meaning this test can be used) for the duration of the COVID-19 declaration under Section 56 4(b)(1) of the Act, 21 U.S.C. section 360bbb-3(b)(1), unless the authorization is terminated or revoked sooner. Performed at [facility], [place] [address], [place], [place] [address]      Labs: BNP (last 3 results) Recent Labs    01/21/19 1156  BNP 923.3*   Basic Metabolic Panel: Recent Labs  Lab 01/18/19 1454 01/21/19 1156 01/22/19 0451  NA 141 140 139  K 4.3 3.9 3.9  CL 107 108 101  CO2 27 24 25   GLUCOSE 216* 184* 328*  BUN 18 19 24*  CREATININE 0.88 0.66 0.86  CALCIUM 9.3 8.8* 9.4  MG  --   --  2.0   Liver Function Tests: Recent Labs  Lab 01/21/19 1156  AST 17  ALT 16  ALKPHOS 89  BILITOT 0.3  PROT 6.7  ALBUMIN 3.6   No results for input(s): LIPASE, AMYLASE in the last 168 hours. No results for input(s): AMMONIA in the last 168 hours. CBC: Recent Labs  Lab 01/18/19 1454 01/21/19 1156  WBC 10.5 9.3  NEUTROABS  --  5.7  HGB 14.5 13.8  HCT 44.9 42.6  MCV 93.2 93.2  PLT 181 158   Cardiac Enzymes: No results for input(s): CKTOTAL, CKMB, CKMBINDEX, TROPONINI in the last 168 hours. BNP: Invalid input(s): POCBNP CBG: Recent Labs  Lab 01/22/19 0847 01/22/19 1228 01/22/19 1614 01/22/19 2053 01/23/19 0811  GLUCAP 373* 322* 258* 240* 209*   D-Dimer No results for input(s): DDIMER in the last 72 hours. Hgb A1c No results for input(s): HGBA1C in the last 72 hours. Lipid Profile No results for input(s): CHOL, HDL, LDLCALC, TRIG, CHOLHDL, LDLDIRECT in the last 72 hours. Thyroid function studies No results for input(s): TSH, T4TOTAL, T3FREE, THYROIDAB in the last 72 hours.  Invalid  input(s): FREET3 Anemia work up No results for input(s): VITAMINB12, FOLATE, FERRITIN, TIBC, IRON, RETICCTPCT in  the last 72 hours. Urinalysis    Component Value Date/Time   COLORURINE STRAW (A) 01/21/2019 1513   APPEARANCEUR CLEAR 01/21/2019 1513   APPEARANCEUR Clear 10/01/2017 1424   LABSPEC 1.009 01/21/2019 1513   PHURINE 7.0 01/21/2019 1513   GLUCOSEU NEGATIVE 01/21/2019 1513   HGBUR NEGATIVE 01/21/2019 1513   BILIRUBINUR NEGATIVE 01/21/2019 1513   BILIRUBINUR Negative 10/01/2017 1424   KETONESUR NEGATIVE 01/21/2019 1513   PROTEINUR NEGATIVE 01/21/2019 1513   UROBILINOGEN 0.2 07/23/2015 1350   NITRITE NEGATIVE 01/21/2019 1513   LEUKOCYTESUR NEGATIVE 01/21/2019 1513   Sepsis Labs Invalid input(s): PROCALCITONIN,  WBC,  LACTICIDVEN Microbiology Recent Results (from the past 240 hour(s))  Surgical pcr screen     Status: None   Collection Time: 01/18/19  2:54 PM   Specimen: Nasal Mucosa; Nasal Swab  Result Value Ref Range Status   MRSA, PCR NEGATIVE NEGATIVE Final   Staphylococcus aureus NEGATIVE NEGATIVE Final    Comment: (NOTE) The Xpert SA Assay (FDA approved for NASAL specimens in patients 57 years of age and older), is one component of a comprehensive surveillance program. It is not intended to diagnose infection nor to guide or monitor treatment. Performed at [facility], [place] [address], [place], [place] [address]   SARS CORONAVIRUS 2 Nasal Swab Aptima Multi Swab     Status: None   Collection Time: 01/19/19  9:32 AM   Specimen: Aptima Multi Swab; Nasal Swab  Result Value Ref Range Status   SARS Coronavirus 2 NEGATIVE NEGATIVE Final    Comment: (NOTE) SARS-CoV-2 target nucleic acids are NOT DETECTED. The SARS-CoV-2 RNA is generally detectable in upper and lower respiratory specimens during the acute phase of infection. Negative results do not preclude SARS-CoV-2 infection, do not rule out co-infections with other pathogens, and should not be  used as the sole basis for treatment or other patient management decisions. Negative results must be combined with clinical observations, patient history, and epidemiological information. The expected result is Negative. Fact Sheet for Patients: SugarRoll.be Fact Sheet for Healthcare Providers: https://www.woods-mathews.com/ This test is not yet approved or cleared by the [place] FDA and  has been authorized for detection and/or diagnosis of SARS-CoV-2 by FDA under an Emergency Use Authorization (EUA). This EUA will remain  in effect (meaning this test can be used) for the duration of the COVID-19 declaration under Section 56 4(b)(1) of the Act, 21 U.S.C. section 360bbb-3(b)(1), unless the authorization is terminated or revoked sooner. Performed at [facility], [place] [address] [place],  [address]      Time coordinating discharge: 34 minutes  SIGNED:   Hosie Poisson, MD  [facility] 01/25/2019, 12:29 AM Pager   If 7PM-7AM, please contact night-coverage [email or web address] Password [email or web address]

## 2019-01-25 NOTE — Discharge Summary (Signed)
Orthopedic Discharge Summary        Physician Discharge Summary  Patient ID: Amber Stephenson MRN: [id number] DOB/AGE: Apr 21, 1946 72 y.o.  Admit date: 01/21/2019 Discharge date: 01/23/19  Procedures:  Procedure(s) (LRB): REVERSE TOTAL SHOULDER ARTHROPLASTY (Right)  Attending Physician:  Dr. Esmond Plants  Admission Diagnoses:   Right shoulder cuff arthropathy  Discharge Diagnoses:  Right shoulder cuff arthropathy   Past Medical History:  Diagnosis Date  . Atrial fibrillation (HCC)    PERMANENT  . Bipolar affective ([place])   . CAD (coronary artery disease)    NONOBSTRUCTIVE CAD; MGD BY DR. Bronson Ing   . CHF (congestive heart failure) (HCC)    DIASTOLIC  . Chronic back pain    LOWER   . Diabetes mellitus without complication ([place])    TYPE 2   . Hyperlipidemia   . Hypertension   . Morbid obesity due to excess calories ([place]) 07/06/2018  . Pain management   . Pulmonary HTN ([place])   . Renal insufficiency    DR [facility]      PCP: [name], DO   Discharged Condition: good  Hospital Course:  Patient underwent the above stated procedure on 01/21/2019. Patient tolerated the procedure well and brought to the recovery room in good condition and subsequently to the floor. Patient had an uncomplicated hospital course and was stable for discharge.   Disposition:  with follow up in 2 weeks       Allergies as of 01/21/2019      Reactions   Ivp Dye [iodinated Diagnostic Agents] Anaphylaxis, Swelling   Throat closes   Latex    Latex tape pulls skin with it      Medication List    ASK your doctor about these medications   apixaban 5 MG Tabs tablet Commonly known as: Eliquis Take 1 tablet (5 mg total) by mouth 2 (two) times daily.   ARIPiprazole 10 MG tablet Commonly known as: ABILIFY Take 3 tablets (30 mg total) by mouth daily.   atorvastatin 40 MG tablet Commonly known as: LIPITOR TAKE 1 TABLET DAILY   budesonide-formoterol 80-4.5 MCG/ACT inhaler  Commonly known as: Symbicort Inhale 2 puffs into the lungs 2 (two) times daily.   carbamazepine 100 MG 12 hr capsule Commonly known as: CARBATROL Take 1 capsule (100 mg total) by mouth daily.   diltiazem 120 MG 24 hr capsule Commonly known as: CARDIZEM CD TAKE 1 CAPSULE AT BEDTIME   Dulaglutide 1.5 MG/0.5ML Sopn Commonly known as: Trulicity Inject 1.5 mg into the skin once a week.   Eszopiclone 3 MG Tabs Commonly known as: eszopiclone Take 1 tablet (3 mg total) by mouth at bedtime as needed. Take immediately before bedtime   freestyle lancets Test tid. DX E11.22   FreeStyle Lite Devi Use to check blood sugar tid. DX E11.22   furosemide 20 MG tablet Commonly known as: LASIX Take 1 tablet (20 mg total) by mouth 2 (two) times daily.   glucose blood test strip Commonly known as: FREESTYLE LITE USE FOR TESTING SUGAR THREE TIMES DAILY   Insulin Glargine 100 UNIT/ML Solostar Pen Commonly known as: Lantus SoloStar INJECT 60 UNITS UNDER THE SKIN TWICE A DAY   Insulin Pen Needle 32G X 4 MM Misc Commonly known as: Sure Comfort Pen Needles Use with insulin pens as directed. Dx E11.9   LORazepam 1 MG tablet Commonly known as: Ativan Take 1 tab (1 mg) in the morning and 2 tabs (2 mg ) at bedtime   metoprolol tartrate  25 MG tablet Commonly known as: LOPRESSOR Take 1 tablet (25 mg total) by mouth 2 (two) times daily. Take with metoprolol 100 mg to equal 125 mg   metoprolol tartrate 100 MG tablet Commonly known as: LOPRESSOR TAKE 1 TABLET TWICE A DAY WITH METOPROLOL 25 MG TO EQUAL 125 MG   Narcan 4 MG/0.1ML Liqd nasal spray kit Generic drug: naloxone Place 1 spray into the nose once as needed (opiod overdose).   nystatin powder Commonly known as: MYCOSTATIN/NYSTOP Apply topically 3 (three) times daily. x2 weeks (for groin/breast rash)   pregabalin 150 MG capsule Commonly known as: Lyrica Take 1 capsule (150 mg total) by mouth 2 (two) times daily.   rizatriptan 10 MG  disintegrating tablet Commonly known as: MAXALT-MLT DISSOLVE 1 TABLET ON THE TONGUE AT FIRST SIGN OF HEADACHE. MAY REPEAT ONE DOSE IN 1 HOUR IF NEEDED   topiramate 25 MG tablet Commonly known as: TOPAMAX 1 tablet in the morning and 3 tablets every night   traZODone 100 MG tablet Commonly known as: DESYREL 4  qhs   Vascepa 1 g Caps Generic drug: Icosapent Ethyl Take 2 capsules (2 g total) by mouth 2 (two) times daily with a meal.   Vitamin D3 25 MCG (1000 UT) Caps Take 1,000 Units by mouth daily.         Signed: Ventura Bruns 01/25/2019, 12:59 PM  [facility] is now [facility] [address], [place], [place], [place] [address] Phone: [place]

## 2019-01-26 ENCOUNTER — Encounter: Payer: Self-pay | Admitting: Neurology

## 2019-01-26 ENCOUNTER — Ambulatory Visit (INDEPENDENT_AMBULATORY_CARE_PROVIDER_SITE_OTHER): Payer: Medicare Other | Admitting: Neurology

## 2019-01-26 ENCOUNTER — Other Ambulatory Visit: Payer: Self-pay | Admitting: Family Medicine

## 2019-01-26 ENCOUNTER — Other Ambulatory Visit: Payer: Self-pay

## 2019-01-26 ENCOUNTER — Telehealth: Payer: Self-pay | Admitting: *Deleted

## 2019-01-26 VITALS — BP 126/84 | HR 78 | Ht 62.0 in | Wt 200.5 lb

## 2019-01-26 DIAGNOSIS — R413 Other amnesia: Secondary | ICD-10-CM | POA: Diagnosis not present

## 2019-01-26 DIAGNOSIS — G43109 Migraine with aura, not intractable, without status migrainosus: Secondary | ICD-10-CM | POA: Diagnosis not present

## 2019-01-26 MED ORDER — TOPIRAMATE 25 MG PO TABS: ORAL_TABLET | ORAL | 11 refills | Status: DC

## 2019-01-26 NOTE — Patient Instructions (Addendum)
1. Reduce Topamax 25mg  to 2 tablets every night  2. Schedule Neurocognitive testing. Our office will call you to schedule this.  3. Follow-up in 6 months, call for any changes   RECOMMENDATIONS FOR ALL PATIENTS WITH MEMORY PROBLEMS: 1. Continue to exercise (Recommend 30 minutes of walking everyday, or 3 hours every week) 2. Increase social interactions - continue going to [place] and enjoy social gatherings with friends and family 3. Eat healthy, avoid fried foods and eat more fruits and vegetables 4. Maintain adequate blood pressure, blood sugar, and blood cholesterol level. Reducing the risk of stroke and cardiovascular disease also helps promoting better memory. 5. Avoid stressful situations. Live a simple life and avoid aggravations. Organize your time and prepare for the next day in anticipation. 6. Sleep well, avoid any interruptions of sleep and avoid any distractions in the bedroom that may interfere with adequate sleep quality 7. Avoid sugar, avoid sweets as there is a strong link between excessive sugar intake, diabetes, and cognitive impairment The Mediterranean diet has been shown to help patients reduce the risk of progressive memory disorders and reduces cardiovascular risk. This includes eating fish, eat fruits and green leafy vegetables, nuts like almonds and hazelnuts, walnuts, and also use olive oil. Avoid fast foods and fried foods as much as possible. Avoid sweets and sugar as sugar use has been linked to worsening of memory function.

## 2019-01-26 NOTE — Progress Notes (Signed)
NEUROLOGY FOLLOW UP OFFICE NOTE  Amber Stephenson [id number] [birth year]  HISTORY OF PRESENT ILLNESS: I had the pleasure of seeing Amber Stephenson in follow-up in the neurology clinic on 01/26/2019. Her husband is present to provide additional history. The patient was last seen 5 months ago for memory changes. MRI brain no acute changes.TSH and B12 normal.  MOCA 23/30 in March 2020 (MMSE 28/30 in August 2019, 30/30 in April 2019). Since her last visit, she reports her memory is getting worse. She is noted to be tired-appearing, keeping her eyes closed with a slight delay answering questions. She states her sleep schedule has not been good since her surgery. Her husband, on the other hand, states her memory is pretty good. There are times she is forgetful but overall doing okay. He states she does not repeat herself. Her husband manages finances and medications. She does not drive. He has been helping her with dressing and bathing due to balance issues. She has back pain and right shoulder pain, no falls. Her husband reports she was very anxious about her surgery last week, but otherwise is pretty stable. She states her mood is good, she sees Dr. Casimiro Needle every 3 months. Her migraines are not as often, she is taking Topamax 25mg  in AM, 75mg  in PM. She has noticed word-finding difficulties which would frustrate her, "but nothing major."   History on Initial Assessment 07/17/2017: This is a 73 year old right-handed woman with a history of hypertension, hyperlipidemia, diabetes, atrial fibrillation, chronic pain, bipolar disorder, presenting for evaluation of memory changes. She feels her memory has gotten really bad over the past 2 months. She cannot remember words/word-finding difficulties. She "does not think it's Alzheimers" but feels like she is not paying attention. Family reminds her that they had told her something already previously. Her husband has to remind her of her medications. They put her pills  together in a pillbox. Her husband is in charge of finances. She stopped driving 4 years ago, she denied getting lost when she used to drive. She started noticing memory changes after she was started on a medication after she had her colonoscopy. She has also been dealing with a lot of pain, she states her Percocet dose was cut in half a month ago and this is causing her a lot of issues. She has poor sleep, 3 hours at the most. She was recently started on Trazodone by her psychiatrist, which may be helping. Her father had Alzheimer's disease. She denies any history of significant head injuries, no alcohol use.  She has frequent headaches, around 20 headache days a month with stabbing pain in the frontal region that can last up to 3 days, with associated nausea/vomiting. If she takes medication, pain lasts up to 2 hours. She has dizziness at least 1-2 times a day where it feels like the room is spinning. She has had falls for "no clear reason," last fall was 3 months ago. She has chronic neck and back pain, no focal numbness/tingling/weakness. She has tremors in both hands and has a diagnosis of essential tremor, it is "very difficult to do things," it has affected her handwriting, computer use. No family history of tremors. No bowel/bladder dysfunction or anosmia.   I personally reviewed MRI brain without contrast done 05/13/17 which did not show any acute changes. There was mild diffuse atrophy and mild to moderate chronic microvascular disease.  PAST MEDICAL HISTORY: Past Medical History:  Diagnosis Date   Atrial fibrillation ([place])  PERMANENT   Bipolar affective ([place])    CAD (coronary artery disease)    NONOBSTRUCTIVE CAD; MGD BY DR. Bronson Ing    CHF (congestive heart failure) (HCC)    DIASTOLIC   Chronic back pain    LOWER    Diabetes mellitus without complication ([place])    TYPE 2    Hyperlipidemia    Hypertension    Morbid obesity due to excess calories ([place]) 07/06/2018   Pain  management    Pulmonary HTN ([place])    Renal insufficiency    DR [facility]      MEDICATIONS: Current Outpatient Medications on File Prior to Visit  Medication Sig Dispense Refill   acetaminophen (TYLENOL) 325 MG tablet Take 325-650 mg by mouth every 6 (six) hours as needed for mild pain, moderate pain or headache.     apixaban (ELIQUIS) 5 MG TABS tablet Take 1 tablet (5 mg total) by mouth 2 (two) times daily. 60 tablet 0   ARIPiprazole (ABILIFY) 10 MG tablet Take 3 tablets (30 mg total) by mouth daily. 90 tablet 4   atorvastatin (LIPITOR) 40 MG tablet TAKE 1 TABLET DAILY (Patient taking differently: Take 40 mg by mouth daily. ) 90 tablet 0   Blood Glucose Monitoring Suppl (FREESTYLE LITE) DEVI Use to check blood sugar tid. DX E11.22 1 each 0   budesonide-formoterol (SYMBICORT) 80-4.5 MCG/ACT inhaler Inhale 2 puffs into the lungs 2 (two) times daily. (Patient taking differently: Inhale 2 puffs into the lungs 2 (two) times daily as needed (shortness of breath). ) 2 Inhaler 0   carbamazepine (CARBATROL) 100 MG 12 hr capsule Take 1 capsule (100 mg total) by mouth daily. 90 capsule 1   Cholecalciferol (VITAMIN D3) 1000 UNITS CAPS Take 1,000 Units by mouth daily.     diltiazem (CARDIZEM CD) 120 MG 24 hr capsule TAKE 1 CAPSULE AT BEDTIME (Patient taking differently: Take 120 mg by mouth at bedtime. ) 90 capsule 3   Dulaglutide (TRULICITY) 1.5 0000000 SOPN Inject 1.5 mg into the skin once a week. 12 pen 3   Eszopiclone (ESZOPICLONE) 3 MG TABS Take 1 tablet (3 mg total) by mouth at bedtime as needed. Take immediately before bedtime (Patient taking differently: Take 3 mg by mouth at bedtime as needed (sleep). Take immediately before bedtime) 90 tablet 1   glucose blood (FREESTYLE LITE) test strip USE FOR TESTING SUGAR THREE TIMES DAILY 300 each 3   Icosapent Ethyl (VASCEPA) 1 g CAPS Take 2 capsules (2 g total) by mouth 2 (two) times daily with a meal. 360 capsule 0   Insulin Glargine (LANTUS  SOLOSTAR) 100 UNIT/ML Solostar Pen INJECT 60 UNITS UNDER THE SKIN TWICE A DAY (Patient taking differently: Inject 30 Units into the skin 2 (two) times daily. ) 90 mL 0   Insulin Pen Needle (SURE COMFORT PEN NEEDLES) 32G X 4 MM MISC Use with insulin pens as directed. Dx E11.9 300 each 11   Lancets (FREESTYLE) lancets Test tid. DX E11.22 100 each 5   LORazepam (ATIVAN) 1 MG tablet Take 1 tab (1 mg) in the morning and 2 tabs (2 mg ) at bedtime (Patient taking differently: Take 1-2 mg by mouth See admin instructions. Take 1 tab (1 mg) in the morning and 2 tabs (2 mg ) at bedtime) 90 tablet 4   metoprolol tartrate (LOPRESSOR) 100 MG tablet TAKE 1 TABLET TWICE A DAY WITH METOPROLOL 25 MG TO EQUAL 125 MG (Patient taking differently: Take 100 mg by mouth 2 (two) times daily.  Take 100mg  + 25mg  for a total of 125mg  twice a day.) 180 tablet 3   metoprolol tartrate (LOPRESSOR) 25 MG tablet Take 1 tablet (25 mg total) by mouth 2 (two) times daily. Take with metoprolol 100 mg to equal 125 mg (Patient taking differently: Take 25 mg by mouth 2 (two) times daily. Take 100mg  + 25mg  for a total of 125mg  twice a day.) 28 tablet 0   naloxone (NARCAN) nasal spray 4 mg/0.1 mL Place 1 spray into the nose once as needed (opiod overdose).     nystatin (MYCOSTATIN/NYSTOP) powder Apply topically 3 (three) times daily. x2 weeks (for groin/breast rash) (Patient taking differently: Apply 1 g topically 3 (three) times daily as needed (rash). ) 60 g 1   polyvinyl alcohol (LIQUIFILM TEARS) 1.4 % ophthalmic solution Place 1 drop into both eyes as needed for dry eyes.     pregabalin (LYRICA) 150 MG capsule Take 1 capsule (150 mg total) by mouth 2 (two) times daily. 180 capsule 4   rizatriptan (MAXALT-MLT) 10 MG disintegrating tablet DISSOLVE 1 TABLET ON THE TONGUE AT FIRST SIGN OF HEADACHE. MAY REPEAT ONE DOSE IN 1 HOUR IF NEEDED (Patient taking differently: Take 10 mg by mouth See admin instructions. DISSOLVE 1 TABLET ON THE  TONGUE AT FIRST SIGN OF HEADACHE. MAY REPEAT ONE DOSE IN 1 HOUR IF NEEDED) 10 tablet 2   topiramate (TOPAMAX) 25 MG tablet 1 tablet in the morning and 3 tablets every night (Patient taking differently: Take 25-75 mg by mouth See admin instructions. Take 1 tablet in the morning and 3 tablets every night.) 120 tablet 11   traZODone (DESYREL) 100 MG tablet 4  qhs (Patient taking differently: Take 400 mg by mouth at bedtime. ) 360 tablet 1   Current Facility-Administered Medications on File Prior to Visit  Medication Dose Route Frequency Provider Last Rate Last Dose   bupivacaine-epinephrine (MARCAINE W/ EPI) 0.5% -1:200000 injection    Anesthesia Intra-op Effie Berkshire, MD   12 mL at 01/21/19 0935    ALLERGIES: Allergies  Allergen Reactions   Ivp Dye [Iodinated Diagnostic Agents] Anaphylaxis and Swelling    Throat closes   Latex     Latex tape pulls skin with it    FAMILY HISTORY: Family History  Problem Relation Age of Onset   Diabetes Mother    Heart disease Mother    Heart disease Brother    Heart disease Sister        CABG   Alcohol abuse Sister    Mental illness Brother    Diabetes Brother     SOCIAL HISTORY: Social History   Socioeconomic History   Marital status: Married    Spouse name: Orpah Greek   Number of children: 3   Years of education: 16   Highest education level: Bachelor's degree (e.g., BA, AB, BS)  Occupational History   Occupation: Retired  Scientist, product/process development strain: Not hard at [employer]    Worry: Never true    Inability: Never true   Transportation needs    Medical: No    Non-medical: No  Tobacco Use   Smoking status: Never Smoker   Smokeless tobacco: Never Used  Substance and Sexual Activity   Alcohol use: No    Alcohol/week: 0.0 standard drinks   Drug use: No   Sexual activity: Yes    Partners: Male    Birth control/protection: Post-menopausal  Lifestyle   Physical activity    Days per  week: 0 days    Minutes per session: 0 min   Stress: Not at all  Relationships   Social connections    Talks on phone: More than three times a week    Gets together: Once a week    Attends religious service: More than 4 times per year    Active member of club or organization: No    Attends meetings of clubs or organizations: Never    Relationship status: Married   Intimate partner violence    Fear of current or ex partner: No    Emotionally abused: No    Physically abused: No    Forced sexual activity: No  Other Topics Concern   Not on file  Social History Narrative   Lives at home with husband.       Right handed    REVIEW OF SYSTEMS: Constitutional: No fevers, chills, or sweats, no generalized fatigue, change in appetite Eyes: No visual changes, double vision, eye pain Ear, nose and throat: No hearing loss, ear pain, nasal congestion, sore throat Cardiovascular: No chest pain, palpitations Respiratory:  No shortness of breath at rest or with exertion, wheezes GastrointestinaI: No nausea, vomiting, diarrhea, abdominal pain, fecal incontinence Genitourinary:  No dysuria, urinary retention or frequency Musculoskeletal:  + neck pain, back pain Integumentary: No rash, pruritus, skin lesions Neurological: as above Psychiatric: No depression, insomnia, anxiety Endocrine: No palpitations, fatigue, diaphoresis, mood swings, change in appetite, change in weight, increased thirst Hematologic/Lymphatic:  No anemia, purpura, petechiae. Allergic/Immunologic: no itchy/runny eyes, nasal congestion, recent allergic reactions, rashes  PHYSICAL EXAM: Vitals:   01/26/19 1109  BP: 126/84  Pulse: 78  SpO2: 96%   General: No acute distress Head:  Normocephalic/atraumatic Skin/Extremities: No rash, no edema Neurological Exam: alert and oriented to person, city. No aphasia or dysarthria. Fund of knowledge is appropriate.  Recent and remote memory are impaired.  Attention and  concentration are reduced.    Able to name objects and repeat phrases.  Montreal Cognitive Assessment  01/26/2019 08/06/2018  Visuospatial/ Executive (0/5) 2 4  Naming (0/3) 3 3  Attention: Read list of digits (0/2) 1 2  Attention: Read list of letters (0/1) 0 1  Attention: Serial 7 subtraction starting at 100 (0/3) 0 1  Language: Repeat phrase (0/2) 1 1  Language : Fluency (0/1) 0 0  Abstraction (0/2) 1 1  Delayed Recall (0/5) 0 4  Orientation (0/6) 3 6  Total 11 23   Cranial nerves: Pupils equal, round, reactive to light. Extraocular movements intact with no nystagmus. Visual fields full. Facial sensation intact. No facial asymmetry. Tongue, uvula, palate midline.  Motor: Bulk and tone normal, muscle strength 5/5 throughout with no pronator drift. Gait slow and cautious due to back pain.    IMPRESSION: This is a 73 yo RH woman with a history of hypertension, hyperlipidemia, diabetes, atrial fibrillation, chronic pain, bipolar disorder, who presented for memory changes. MOCA score today 11/40, which is a noticeable decline from [facility] in March 2020 (23/30). She is noted to be more tired today and has not been sleeping well. Her husband denies any significant memory changes. Neurocognitive testing will be scheduled to further evaluate cognitive complaints. Topamax has helped with migraines but may be contributing to word-finding difficulties, reduce to 50mg  qhs. We discussed the importance of control of vascular risk factors, physical exercise, and brain stimulation exercises for brain health. Follow-up in 6 months, she knows to call for any changes.  Thank you for allowing me to  participate in her care.  Please do not hesitate to call for any questions or concerns.   Ellouise Newer, M.D.   CC: Dr. Lajuana Ripple

## 2019-01-26 NOTE — Telephone Encounter (Signed)
appt rescheduled. Pt notified.

## 2019-01-28 DIAGNOSIS — Z79899 Other long term (current) drug therapy: Secondary | ICD-10-CM | POA: Diagnosis not present

## 2019-01-28 DIAGNOSIS — M545 Low back pain: Secondary | ICD-10-CM | POA: Diagnosis not present

## 2019-01-28 DIAGNOSIS — S32010A Wedge compression fracture of first lumbar vertebra, initial encounter for closed fracture: Secondary | ICD-10-CM | POA: Diagnosis not present

## 2019-01-28 DIAGNOSIS — M25561 Pain in right knee: Secondary | ICD-10-CM | POA: Diagnosis not present

## 2019-01-28 DIAGNOSIS — M5136 Other intervertebral disc degeneration, lumbar region: Secondary | ICD-10-CM | POA: Diagnosis not present

## 2019-01-28 DIAGNOSIS — M25572 Pain in left ankle and joints of left foot: Secondary | ICD-10-CM | POA: Diagnosis not present

## 2019-01-31 ENCOUNTER — Ambulatory Visit: Payer: Medicare Other | Admitting: Family Medicine

## 2019-01-31 ENCOUNTER — Other Ambulatory Visit: Payer: Self-pay

## 2019-02-01 ENCOUNTER — Encounter: Payer: Self-pay | Admitting: Family

## 2019-02-01 ENCOUNTER — Other Ambulatory Visit: Payer: Self-pay | Admitting: Family Medicine

## 2019-02-01 ENCOUNTER — Encounter (HOSPITAL_COMMUNITY): Payer: Self-pay

## 2019-02-01 ENCOUNTER — Inpatient Hospital Stay (HOSPITAL_COMMUNITY)
Admission: EM | Admit: 2019-02-01 | Discharge: 2019-02-04 | DRG: 291 | Disposition: A | Discharge status: Home Health | Payer: Medicare Other | Attending: Family Medicine | Admitting: Family Medicine

## 2019-02-01 ENCOUNTER — Ambulatory Visit (INDEPENDENT_AMBULATORY_CARE_PROVIDER_SITE_OTHER): Payer: Medicare Other | Admitting: Family

## 2019-02-01 ENCOUNTER — Other Ambulatory Visit: Payer: Self-pay

## 2019-02-01 ENCOUNTER — Emergency Department (HOSPITAL_COMMUNITY): Payer: Medicare Other

## 2019-02-01 VITALS — BP 107/76 | HR 75 | Temp 98.4°F | Ht 62.0 in | Wt 202.0 lb

## 2019-02-01 DIAGNOSIS — Z8249 Family history of ischemic heart disease and other diseases of the circulatory system: Secondary | ICD-10-CM

## 2019-02-01 DIAGNOSIS — R069 Unspecified abnormalities of breathing: Secondary | ICD-10-CM | POA: Diagnosis not present

## 2019-02-01 DIAGNOSIS — Z7901 Long term (current) use of anticoagulants: Secondary | ICD-10-CM

## 2019-02-01 DIAGNOSIS — J45909 Unspecified asthma, uncomplicated: Secondary | ICD-10-CM | POA: Diagnosis present

## 2019-02-01 DIAGNOSIS — R410 Disorientation, unspecified: Secondary | ICD-10-CM

## 2019-02-01 DIAGNOSIS — E785 Hyperlipidemia, unspecified: Secondary | ICD-10-CM | POA: Diagnosis present

## 2019-02-01 DIAGNOSIS — R0902 Hypoxemia: Secondary | ICD-10-CM | POA: Diagnosis present

## 2019-02-01 DIAGNOSIS — F1120 Opioid dependence, uncomplicated: Secondary | ICD-10-CM | POA: Diagnosis present

## 2019-02-01 DIAGNOSIS — Z833 Family history of diabetes mellitus: Secondary | ICD-10-CM

## 2019-02-01 DIAGNOSIS — I5033 Acute on chronic diastolic (congestive) heart failure: Secondary | ICD-10-CM

## 2019-02-01 DIAGNOSIS — R0602 Shortness of breath: Secondary | ICD-10-CM | POA: Diagnosis not present

## 2019-02-01 DIAGNOSIS — I2510 Atherosclerotic heart disease of native coronary artery without angina pectoris: Secondary | ICD-10-CM | POA: Diagnosis present

## 2019-02-01 DIAGNOSIS — Z79899 Other long term (current) drug therapy: Secondary | ICD-10-CM

## 2019-02-01 DIAGNOSIS — Z20828 Contact with and (suspected) exposure to other viral communicable diseases: Secondary | ICD-10-CM | POA: Diagnosis not present

## 2019-02-01 DIAGNOSIS — R918 Other nonspecific abnormal finding of lung field: Secondary | ICD-10-CM | POA: Diagnosis not present

## 2019-02-01 DIAGNOSIS — J9601 Acute respiratory failure with hypoxia: Secondary | ICD-10-CM | POA: Diagnosis present

## 2019-02-01 DIAGNOSIS — I110 Hypertensive heart disease with heart failure: Principal | ICD-10-CM | POA: Diagnosis present

## 2019-02-01 DIAGNOSIS — F411 Generalized anxiety disorder: Secondary | ICD-10-CM | POA: Diagnosis present

## 2019-02-01 DIAGNOSIS — E119 Type 2 diabetes mellitus without complications: Secondary | ICD-10-CM | POA: Diagnosis present

## 2019-02-01 DIAGNOSIS — I959 Hypotension, unspecified: Secondary | ICD-10-CM | POA: Diagnosis not present

## 2019-02-01 DIAGNOSIS — T402X5A Adverse effect of other opioids, initial encounter: Secondary | ICD-10-CM | POA: Diagnosis present

## 2019-02-01 DIAGNOSIS — R0689 Other abnormalities of breathing: Secondary | ICD-10-CM | POA: Diagnosis not present

## 2019-02-01 DIAGNOSIS — I517 Cardiomegaly: Secondary | ICD-10-CM | POA: Diagnosis not present

## 2019-02-01 DIAGNOSIS — Z09 Encounter for follow-up examination after completed treatment for conditions other than malignant neoplasm: Secondary | ICD-10-CM

## 2019-02-01 DIAGNOSIS — Z794 Long term (current) use of insulin: Secondary | ICD-10-CM

## 2019-02-01 DIAGNOSIS — I272 Pulmonary hypertension, unspecified: Secondary | ICD-10-CM | POA: Diagnosis present

## 2019-02-01 DIAGNOSIS — G43909 Migraine, unspecified, not intractable, without status migrainosus: Secondary | ICD-10-CM | POA: Diagnosis present

## 2019-02-01 DIAGNOSIS — F3130 Bipolar disorder, current episode depressed, mild or moderate severity, unspecified: Secondary | ICD-10-CM | POA: Diagnosis present

## 2019-02-01 DIAGNOSIS — I4821 Permanent atrial fibrillation: Secondary | ICD-10-CM | POA: Diagnosis present

## 2019-02-01 DIAGNOSIS — K760 Fatty (change of) liver, not elsewhere classified: Secondary | ICD-10-CM | POA: Diagnosis present

## 2019-02-01 DIAGNOSIS — G8929 Other chronic pain: Secondary | ICD-10-CM | POA: Diagnosis present

## 2019-02-01 DIAGNOSIS — K5903 Drug induced constipation: Secondary | ICD-10-CM | POA: Diagnosis present

## 2019-02-01 DIAGNOSIS — I4891 Unspecified atrial fibrillation: Secondary | ICD-10-CM | POA: Diagnosis not present

## 2019-02-01 HISTORY — DX: Hypoxemia: R09.02

## 2019-02-01 LAB — BASIC METABOLIC PANEL
Anion gap: 10 (ref 5–15)
BUN: 20 mg/dL (ref 8–23)
CO2: 25 mmol/L (ref 22–32)
Calcium: 8.9 mg/dL (ref 8.9–10.3)
Chloride: 106 mmol/L (ref 98–111)
Creatinine, Ser: 1.09 mg/dL — ABNORMAL HIGH (ref 0.44–1.00)
GFR calc Af Amer: 59 mL/min — ABNORMAL LOW (ref >60)
GFR calc non Af Amer: 51 mL/min — ABNORMAL LOW (ref >60)
Glucose, Bld: 101 mg/dL — ABNORMAL HIGH (ref 70–99)
Potassium: 3.5 mmol/L (ref 3.5–5.1)
Sodium: 141 mmol/L (ref 135–145)

## 2019-02-01 LAB — CBC WITH DIFFERENTIAL/PLATELET
Abs Immature Granulocytes: 0.03 10*3/uL (ref 0.00–0.07)
Basophils Absolute: 0 10*3/uL (ref 0.0–0.1)
Basophils Relative: 0 %
Eosinophils Absolute: 0.2 10*3/uL (ref 0.0–0.5)
Eosinophils Relative: 2 %
HCT: 42.6 % (ref 36.0–46.0)
Hemoglobin: 13.7 g/dL (ref 12.0–15.0)
Immature Granulocytes: 0 %
Lymphocytes Relative: 33 %
Lymphs Abs: 3 10*3/uL (ref 0.7–4.0)
MCH: 30.5 pg (ref 26.0–34.0)
MCHC: 32.2 g/dL (ref 30.0–36.0)
MCV: 94.9 fL (ref 80.0–100.0)
Monocytes Absolute: 0.6 10*3/uL (ref 0.1–1.0)
Monocytes Relative: 7 %
Neutro Abs: 5 10*3/uL (ref 1.7–7.7)
Neutrophils Relative %: 58 %
Platelets: 163 10*3/uL (ref 150–400)
RBC: 4.49 MIL/uL (ref 3.87–5.11)
RDW: 14 % (ref 11.5–15.5)
WBC: 8.9 10*3/uL (ref 4.0–10.5)
nRBC: 0 % (ref 0.0–0.2)

## 2019-02-01 LAB — SARS CORONAVIRUS 2 BY RT PCR (HOSPITAL ORDER, PERFORMED IN ~~LOC~~ HOSPITAL LAB): SARS Coronavirus 2: NEGATIVE

## 2019-02-01 LAB — D-DIMER, QUANTITATIVE: D-Dimer, Quant: 0.28 ug/mL-FEU (ref 0.00–0.50)

## 2019-02-01 LAB — BRAIN NATRIURETIC PEPTIDE: B Natriuretic Peptide: 260 pg/mL — ABNORMAL HIGH (ref 0.0–100.0)

## 2019-02-01 LAB — TROPONIN I (HIGH SENSITIVITY)
Troponin I (High Sensitivity): 7 ng/L (ref <18)
Troponin I (High Sensitivity): 7 ng/L (ref <18)

## 2019-02-01 LAB — GLUCOSE, CAPILLARY: Glucose-Capillary: 161 mg/dL — ABNORMAL HIGH (ref 70–99)

## 2019-02-01 MED ORDER — TOPIRAMATE 25 MG PO TABS
50.0000 mg | ORAL_TABLET | Freq: Every day | ORAL | Status: DC
  Administered 2019-02-01 – 2019-02-03 (×3): 50 mg via ORAL
  Filled 2019-02-01 (×3): qty 2

## 2019-02-01 MED ORDER — INSULIN ASPART 100 UNIT/ML ~~LOC~~ SOLN
0.0000 [IU] | Freq: Three times a day (TID) | SUBCUTANEOUS | Status: DC
  Administered 2019-02-02: 1 [IU] via SUBCUTANEOUS
  Administered 2019-02-02: 3 [IU] via SUBCUTANEOUS
  Administered 2019-02-03: 1 [IU] via SUBCUTANEOUS
  Administered 2019-02-03: 2 [IU] via SUBCUTANEOUS
  Administered 2019-02-03 – 2019-02-04 (×2): 3 [IU] via SUBCUTANEOUS
  Administered 2019-02-04: 1 [IU] via SUBCUTANEOUS

## 2019-02-01 MED ORDER — FUROSEMIDE 10 MG/ML IJ SOLN
40.0000 mg | Freq: Two times a day (BID) | INTRAMUSCULAR | Status: DC
  Administered 2019-02-02 – 2019-02-03 (×3): 40 mg via INTRAVENOUS
  Filled 2019-02-01 (×3): qty 4

## 2019-02-01 MED ORDER — ENSURE ENLIVE PO LIQD
237.0000 mL | Freq: Two times a day (BID) | ORAL | Status: DC
  Administered 2019-02-02 – 2019-02-04 (×4): 237 mL via ORAL

## 2019-02-01 MED ORDER — ACETAMINOPHEN 325 MG PO TABS
650.0000 mg | ORAL_TABLET | Freq: Four times a day (QID) | ORAL | Status: DC | PRN
  Administered 2019-02-02: 650 mg via ORAL
  Filled 2019-02-01 (×2): qty 2

## 2019-02-01 MED ORDER — FUROSEMIDE 10 MG/ML IJ SOLN
60.0000 mg | Freq: Once | INTRAMUSCULAR | Status: AC
  Administered 2019-02-01: 60 mg via INTRAVENOUS
  Filled 2019-02-01: qty 6

## 2019-02-01 MED ORDER — ARIPIPRAZOLE 10 MG PO TABS
30.0000 mg | ORAL_TABLET | Freq: Every day | ORAL | Status: DC
  Administered 2019-02-02 – 2019-02-04 (×3): 30 mg via ORAL
  Filled 2019-02-01 (×3): qty 3

## 2019-02-01 MED ORDER — ICOSAPENT ETHYL 1 G PO CAPS: 2.0000 g | ORAL_CAPSULE | Freq: Two times a day (BID) | ORAL | Status: DC

## 2019-02-01 MED ORDER — METOPROLOL TARTRATE 25 MG PO TABS: 25.0000 mg | ORAL_TABLET | Freq: Two times a day (BID) | ORAL | Status: DC

## 2019-02-01 MED ORDER — TRAMADOL HCL 50 MG PO TABS
50.0000 mg | ORAL_TABLET | Freq: Once | ORAL | Status: AC
  Administered 2019-02-01: 50 mg via ORAL
  Filled 2019-02-01: qty 1

## 2019-02-01 MED ORDER — MOMETASONE FURO-FORMOTEROL FUM 100-5 MCG/ACT IN AERO
2.0000 | INHALATION_SPRAY | Freq: Two times a day (BID) | RESPIRATORY_TRACT | Status: DC
  Administered 2019-02-02 – 2019-02-04 (×5): 2 via RESPIRATORY_TRACT
  Filled 2019-02-01 (×2): qty 8.8

## 2019-02-01 MED ORDER — ONDANSETRON HCL 4 MG PO TABS: 4.0000 mg | ORAL_TABLET | Freq: Four times a day (QID) | ORAL | Status: DC | PRN

## 2019-02-01 MED ORDER — CARBAMAZEPINE ER 100 MG PO TB12
100.0000 mg | ORAL_TABLET | Freq: Every day | ORAL | Status: DC
  Administered 2019-02-02 – 2019-02-04 (×3): 100 mg via ORAL
  Filled 2019-02-01 (×3): qty 1

## 2019-02-01 MED ORDER — APIXABAN 5 MG PO TABS
5.0000 mg | ORAL_TABLET | Freq: Two times a day (BID) | ORAL | Status: DC
  Administered 2019-02-01 – 2019-02-04 (×6): 5 mg via ORAL
  Filled 2019-02-01 (×6): qty 1

## 2019-02-01 MED ORDER — ACETAMINOPHEN 650 MG RE SUPP: 650.0000 mg | Freq: Four times a day (QID) | RECTAL | Status: DC | PRN

## 2019-02-01 MED ORDER — LORAZEPAM 1 MG PO TABS
1.0000 mg | ORAL_TABLET | Freq: Two times a day (BID) | ORAL | Status: DC | PRN
  Administered 2019-02-01 – 2019-02-03 (×3): 1 mg via ORAL
  Filled 2019-02-01 (×3): qty 1

## 2019-02-01 MED ORDER — IPRATROPIUM-ALBUTEROL 0.5-2.5 (3) MG/3ML IN SOLN: 3.0000 mL | RESPIRATORY_TRACT | Status: DC | PRN

## 2019-02-01 MED ORDER — ENOXAPARIN SODIUM 60 MG/0.6ML ~~LOC~~ SOLN: 45.0000 mg | SUBCUTANEOUS | Status: DC

## 2019-02-01 MED ORDER — ONDANSETRON HCL 4 MG/2ML IJ SOLN: 4.0000 mg | Freq: Four times a day (QID) | INTRAMUSCULAR | Status: DC | PRN

## 2019-02-01 MED ORDER — PREGABALIN 75 MG PO CAPS
150.0000 mg | ORAL_CAPSULE | Freq: Two times a day (BID) | ORAL | Status: DC
  Administered 2019-02-01 – 2019-02-04 (×6): 150 mg via ORAL
  Filled 2019-02-01 (×6): qty 2

## 2019-02-01 MED ORDER — METOPROLOL TARTRATE 50 MG PO TABS: 100.0000 mg | ORAL_TABLET | Freq: Two times a day (BID) | ORAL | Status: DC

## 2019-02-01 MED ORDER — METOPROLOL TARTRATE 50 MG PO TABS
125.0000 mg | ORAL_TABLET | Freq: Two times a day (BID) | ORAL | Status: DC
  Administered 2019-02-01 – 2019-02-04 (×6): 125 mg via ORAL
  Filled 2019-02-01 (×6): qty 2

## 2019-02-01 MED ORDER — ATORVASTATIN CALCIUM 40 MG PO TABS
40.0000 mg | ORAL_TABLET | Freq: Every day | ORAL | Status: DC
  Administered 2019-02-02 – 2019-02-04 (×3): 40 mg via ORAL
  Filled 2019-02-01 (×3): qty 1

## 2019-02-01 MED ORDER — DILTIAZEM HCL ER COATED BEADS 120 MG PO CP24
120.0000 mg | ORAL_CAPSULE | Freq: Every day | ORAL | Status: DC
  Administered 2019-02-01 – 2019-02-03 (×3): 120 mg via ORAL
  Filled 2019-02-01 (×3): qty 1

## 2019-02-01 MED ORDER — INSULIN GLARGINE 100 UNIT/ML ~~LOC~~ SOLN
20.0000 [IU] | Freq: Two times a day (BID) | SUBCUTANEOUS | Status: DC
  Administered 2019-02-01 – 2019-02-04 (×6): 20 [IU] via SUBCUTANEOUS
  Filled 2019-02-01 (×12): qty 0.2

## 2019-02-01 MED ORDER — POLYETHYLENE GLYCOL 3350 17 G PO PACK
17.0000 g | PACK | Freq: Every day | ORAL | Status: DC | PRN
  Administered 2019-02-03: 17 g via ORAL
  Filled 2019-02-01: qty 1

## 2019-02-01 NOTE — Progress Notes (Signed)
   Subjective:    Patient ID: Amber Stephenson, female    DOB: [birth year], 73 y.o.   MRN: [id number]  Chief Complaint  Patient presents with  . Hospitalization Follow-up   Pt presents to the office today for hospital follow up. She was scheduled to have a shoulder arthroplasty and was given a nerve block and became SOB and sent to the ED.  She then had IV lasix and had 3 liters diureses.  She is SOB at this time and confused at times.   Shortness of Breath This is a recurrent problem. The current episode started in the past 7 days. The problem occurs intermittently. Pertinent negatives include no fever, headaches, leg swelling or syncope. She has tried rest for the symptoms. The treatment provided mild relief.      Review of Systems  Constitutional: Negative for fever.  Respiratory: Positive for shortness of breath.   Cardiovascular: Negative for leg swelling and syncope.  Neurological: Negative for headaches.  All other systems reviewed and are negative.      Objective:   Physical Exam Vitals signs reviewed.  Constitutional:      General: She is not in acute distress.    Appearance: She is well-developed. She is obese.  HENT:     Head: Normocephalic and atraumatic.  Eyes:     Pupils: Pupils are equal, round, and reactive to light.  Neck:     Musculoskeletal: Normal range of motion and neck supple.     Thyroid: No thyromegaly.  Cardiovascular:     Rate and Rhythm: Normal rate and regular rhythm.     Heart sounds: Normal heart sounds. No murmur.  Pulmonary:     Effort: Pulmonary effort is normal. No respiratory distress.     Breath sounds: Rales present. No wheezing.  Abdominal:     General: Bowel sounds are normal. There is no distension.     Palpations: Abdomen is soft.     Tenderness: There is no abdominal tenderness.  Musculoskeletal:        General: No tenderness.  Skin:    General: Skin is warm and dry.  Neurological:     Mental Status: She is oriented to  person, place, and time. She is lethargic.     Cranial Nerves: No cranial nerve deficit.     Gait: Gait abnormal.     Deep Tendon Reflexes: Reflexes are normal and symmetric.     Comments: weakness  Psychiatric:        Behavior: Behavior normal.        Thought Content: Thought content normal.        Judgment: Judgment normal.     BP 107/76   Pulse 75   Temp 98.4 F (36.9 C) (Temporal)   Ht 5\' 2"  (1.575 m)   Wt 202 lb (91.6 kg)   BMI 36.95 kg/m      Assessment & Plan:  Amber Stephenson comes in today with chief complaint of Hospitalization Follow-up   Diagnosis and orders addressed:  1. SOB (shortness of breath)  2. Hospital discharge follow-up   3. Confusion  4. High risk medication use    Pt walking her O2 sat dropped to 68%. We called EMS and will transport to the hospital. She is on high doses of controlled medications. After reviewing [place] Controlled database it looks like she gets Oxycodone 5 mg #120 and Ativan 1 mg #90 a month.   Evelina Dun, FNP

## 2019-02-01 NOTE — ED Notes (Signed)
Pt's right knee is shattered.

## 2019-02-01 NOTE — H&P (Addendum)
History and Physical    Amber Stephenson [id number] DOB: 28-Aug-1945 DOA: 02/01/2019  PCP: Sharion Balloon, FNP   Patient coming from: Home  Chief Complaint: Difficulty breathing  HPI: Amber Stephenson is a 73 y.o. female with medical history significant for atrial fibrillation, diastolic CHF, depression, bipolar disorder, asthma, NAFLD, pulmonary hypertension, diabetes mellitus, presented to the ED with complaints of difficulty breathing over the past 2 days.  She reports stable 3 pillow orthopnea.  Patient saw her primary care provider and O2 sats was 64% on room air.  She is not on home O2.  On 2 L nasal cannula O2 sats improved to 96%.  Patient denies cough, she denies lower extremity swelling, she denies abdominal bloating or weight gain.  She reports her stable weight is at 197 and this is unchanged, weight in ED is 196 pounds.  No chest pain, no fever no chills.  She reports compliance with her Lasix,  but she is not compliant with a low-salt diet.  Recent hospitalization 8/21 to 8/23 for acute respiratory failure with hypoxia secondary to mild acute chronic diastolic heart failure.  ED Course: O2 sats greater than 96% on 2 L, on my evaluation, with movement she desats to 80s.  D-dimer within normal limits 0.28.  BNP 260.  COVID-19 test negative.  Two-view chest x-ray shows cardiomegaly with vascular congestion, right thorax asymmetric hazy opacity- layering effusion or asymmetric edema.  IV Lasix 60 mg x 1 given in ED hospitalist to admit for acute respiratory distress.  Review of Systems: As per HPI all other systems reviewed and negative.  Past Medical History:  Diagnosis Date  . Atrial fibrillation (HCC)    PERMANENT  . Bipolar affective ([place])   . CAD (coronary artery disease)    NONOBSTRUCTIVE CAD; MGD BY DR. Bronson Ing   . CHF (congestive heart failure) (HCC)    DIASTOLIC  . Chronic back pain    LOWER   . Diabetes mellitus without complication ([place])    TYPE 2   .  Hyperlipidemia   . Hypertension   . Morbid obesity due to excess calories ([place]) 07/06/2018  . Pain management   . Pulmonary HTN ([place])   . Renal insufficiency    DR [facility]      Past Surgical History:  Procedure Laterality Date  . BREAST REDUCTION SURGERY    . EYE SURGERY Right    cateracts  . HAMMER TOE SURGERY    . LEFT HEART CATH AND CORONARY ANGIOGRAPHY N/A 02/03/2018   Procedure: LEFT HEART CATH AND CORONARY ANGIOGRAPHY;  Surgeon: Martinique, Peter M, MD;  Location: [facility];  Service: Cardiovascular;  Laterality: N/A;  . SHOULDER SURGERY Right    "I BROKE MY SHOUDLER  . THIGH SURGERY     "TO REMOVE A TUMOR "     reports that she has never smoked. She has never used smokeless tobacco. She reports that she does not drink alcohol or use drugs.  Allergies  Allergen Reactions  . Ivp Dye [Iodinated Diagnostic Agents] Anaphylaxis and Swelling    Throat closes  . Latex     Latex tape pulls skin with it    Family History  Problem Relation Age of Onset  . Diabetes Mother   . Heart disease Mother   . Heart disease Brother   . Heart disease Sister        CABG  . Alcohol abuse Sister   . Mental illness Brother   . Diabetes Brother  Prior to Admission medications   Medication Sig Start Date End Date Taking? Authorizing Provider  acetaminophen (TYLENOL) 325 MG tablet Take 325-650 mg by mouth every 6 (six) hours as needed for mild pain, moderate pain or headache.   Yes [provider]  apixaban (ELIQUIS) 5 MG TABS tablet Take 1 tablet (5 mg total) by mouth 2 (two) times daily. 12/06/18  Yes Hendricks Limes F, FNP  ARIPiprazole (ABILIFY) 10 MG tablet Take 3 tablets (30 mg total) by mouth daily. 12/23/18  Yes Plovsky, Berneta Sages, MD  atorvastatin (LIPITOR) 40 MG tablet TAKE 1 TABLET DAILY Patient taking differently: Take 40 mg by mouth daily.  09/13/18  Yes Gottschalk, Leatrice Jewels M, DO  budesonide-formoterol (SYMBICORT) 80-4.5 MCG/ACT inhaler Inhale 2 puffs into the lungs 2 (two)  times daily. Patient taking differently: Inhale 2 puffs into the lungs 2 (two) times daily as needed (shortness of breath).  03/04/18  Yes Gottschalk, Leatrice Jewels M, DO  carbamazepine (CARBATROL) 100 MG 12 hr capsule Take 1 capsule (100 mg total) by mouth daily. 12/23/18 12/23/19 Yes Plovsky, Berneta Sages, MD  Cholecalciferol (VITAMIN D3) 1000 UNITS CAPS Take 1,000 Units by mouth daily.   Yes [provider]  diltiazem (CARDIZEM CD) 120 MG 24 hr capsule TAKE 1 CAPSULE AT BEDTIME Patient taking differently: Take 120 mg by mouth at bedtime.  11/04/18  Yes Herminio Commons, MD  Dulaglutide (TRULICITY) 1.5 0000000 SOPN Inject 1.5 mg into the skin once a week. 08/20/17  Yes Timmothy Euler, MD  Eszopiclone (ESZOPICLONE) 3 MG TABS Take 1 tablet (3 mg total) by mouth at bedtime as needed. Take immediately before bedtime Patient taking differently: Take 3 mg by mouth at bedtime as needed (sleep). Take immediately before bedtime 12/23/18 12/23/19 Yes Plovsky, Berneta Sages, MD  furosemide (LASIX) 20 MG tablet TAKE 1 TABLET TWICE A DAY Patient taking differently: Take 20 mg by mouth 2 (two) times daily.  01/26/19  Yes Gottschalk, Leatrice Jewels M, DO  Icosapent Ethyl (VASCEPA) 1 g CAPS Take 2 capsules (2 g total) by mouth 2 (two) times daily with a meal. 11/15/18  Yes Gottschalk, Ashly M, DO  Insulin Glargine (LANTUS SOLOSTAR) 100 UNIT/ML Solostar Pen INJECT 60 UNITS UNDER THE SKIN TWICE A DAY Patient taking differently: Inject 60 Units into the skin 2 (two) times daily.  01/25/18  Yes Herminio Commons, MD  LORazepam (ATIVAN) 1 MG tablet Take 1 tab (1 mg) in the morning and 2 tabs (2 mg ) at bedtime Patient taking differently: Take 1-2 mg by mouth See admin instructions. Take 1 tab (1 mg) in the morning and 2 tabs (2 mg ) at bedtime 12/23/18  Yes Plovsky, Berneta Sages, MD  metoprolol tartrate (LOPRESSOR) 100 MG tablet TAKE 1 TABLET TWICE A DAY WITH METOPROLOL 25 MG TO EQUAL 125 MG Patient taking differently: Take 100 mg by mouth 2  (two) times daily. Take 100mg  + 25mg  for a total of 125mg  twice a day. 11/15/18  Yes Herminio Commons, MD  metoprolol tartrate (LOPRESSOR) 25 MG tablet Take 1 tablet (25 mg total) by mouth 2 (two) times daily. Take with metoprolol 100 mg to equal 125 mg Patient taking differently: Take 25 mg by mouth 2 (two) times daily. Take 100mg  + 25mg  for a total of 125mg  twice a day. 05/24/18  Yes Herminio Commons, MD  naloxone [facility]) nasal spray 4 mg/0.1 mL Place 1 spray into the nose once as needed (opiod overdose).   Yes [provider]  nystatin (MYCOSTATIN/NYSTOP) powder Apply  topically 3 (three) times daily. x2 weeks (for groin/breast rash) Patient taking differently: Apply 1 g topically 3 (three) times daily as needed (rash).  10/26/18  Yes Gottschalk, Leatrice Jewels M, DO  polyvinyl alcohol (LIQUIFILM TEARS) 1.4 % ophthalmic solution Place 1 drop into both eyes as needed for dry eyes.   Yes [provider]  pregabalin (LYRICA) 150 MG capsule Take 1 capsule (150 mg total) by mouth 2 (two) times daily. 07/28/18 07/28/19 Yes Plovsky, Berneta Sages, MD  rizatriptan (MAXALT-MLT) 10 MG disintegrating tablet DISSOLVE 1 TABLET ON THE TONGUE AT FIRST SIGN OF HEADACHE. MAY REPEAT ONE DOSE IN 1 HOUR IF NEEDED Patient taking differently: Take 10 mg by mouth See admin instructions. DISSOLVE 1 TABLET ON THE TONGUE AT FIRST SIGN OF HEADACHE. MAY REPEAT ONE DOSE IN 1 HOUR IF NEEDED 11/23/18  Yes Ronnie Doss M, DO  topiramate (TOPAMAX) 25 MG tablet Take 2 tablets every night Patient taking differently: Take 50 mg by mouth at bedtime. Take 2 tablets every night 01/26/19  Yes Cameron Sprang, MD  traZODone (DESYREL) 100 MG tablet 4  qhs Patient taking differently: Take 400 mg by mouth at bedtime.  12/23/18  Yes Norma Fredrickson, MD    Physical Exam: Vitals:   02/01/19 1725 02/01/19 1726 02/01/19 1727 02/01/19 1800  BP:    102/65  Resp: 13 14 14 15   SpO2: 97% 96% 96% 97%  Weight:      Height:         Constitutional: NAD, calm, comfortable Vitals:   02/01/19 1725 02/01/19 1726 02/01/19 1727 02/01/19 1800  BP:    102/65  Resp: 13 14 14 15   SpO2: 97% 96% 96% 97%  Weight:      Height:       Eyes: PERRL, lids and conjunctivae normal ENMT: Mucous membranes are moist. Posterior pharynx clear of any exudate or lesions. Neck: normal, supple, no masses, no thyromegaly Respiratory: Bilateral lung crackles, no wheezing. Normal respiratory effort. No accessory muscle use.  Cardiovascular: Regular rate and rhythm, no murmurs / rubs / gallops.  Trace to +1 bilateral extremity edema. 2+ pedal pulses. Abdomen: no tenderness, no masses palpated. No hepatosplenomegaly. Bowel sounds positive.  Musculoskeletal: no clubbing / cyanosis. No joint deformity upper and lower extremities. Good ROM, no contractures. Normal muscle tone.  Patient has brace to right knee from prior knee injury, and brace to left ankle for sprain secondary to fall. Skin: no rashes, lesions, ulcers. No induration Neurologic: CN 2-12 grossly intact. . Strength 5/5 in all 4.  Psychiatric: Normal judgment and insight. Alert and oriented x 3. Normal mood.   Labs on Admission: I have personally reviewed following labs and imaging studies  CBC: Recent Labs  Lab 02/01/19 1421  WBC 8.9  NEUTROABS 5.0  HGB 13.7  HCT 42.6  MCV 94.9  PLT [id number]   Basic Metabolic Panel: Recent Labs  Lab 02/01/19 1421  NA 141  K 3.5  CL 106  CO2 25  GLUCOSE 101*  BUN 20  CREATININE 1.09*  CALCIUM 8.9   Urine analysis:    Component Value Date/Time   COLORURINE STRAW (A) 01/21/2019 1513   APPEARANCEUR CLEAR 01/21/2019 1513   APPEARANCEUR Clear 10/01/2017 1424   LABSPEC 1.009 01/21/2019 1513   PHURINE 7.0 01/21/2019 [place] 01/21/2019 1513   [place] 01/21/2019 1513   BILIRUBINUR NEGATIVE 01/21/2019 1513   BILIRUBINUR Negative 10/01/2017 [place] 01/21/2019 1513   [place] 01/21/2019 1513  UROBILINOGEN 0.2 07/23/2015 1350   NITRITE NEGATIVE 01/21/2019 [place] 01/21/2019 1513    Radiological Exams on Admission: Dg Chest Port 1 View  Result Date: 02/01/2019 CLINICAL DATA:  Short of breath EXAM: PORTABLE CHEST 1 VIEW COMPARISON:  01/23/2019, 03/06/2018 FINDINGS: Cardiomegaly with vascular congestion. Asymmetric hazy opacity right thorax, likely rotation and overlying soft tissues. Convex opacity left lower mediastinal silhouette presumably slight unfolding of aorta IMPRESSION: 1. Cardiomegaly with vascular congestion 2. Asymmetric hazy opacity of right thorax, likely due to patient rotation and overlying soft tissue. Alternatively, findings could be secondary to a layering effusion and/or asymmetric edema. Electronically Signed   By: Donavan Foil M.D.   On: 02/01/2019 14:41    EKG: Independently reviewed.  Assessment/Plan Active Problems:   Hypoxia   Acute respiratory failure- hypoxia, reported O2 sats 64%s on room air, mild pitting pedal edema bilaterally.  Chest x-ray shows cardiomegaly with vascular congestion.  Troponin and d-dimer unremarkable.  She is on Eliquis.  Etiology likely from decompensated CHF. -Supplemental O2, Lasix.  Decompensated diastolic CHF-hypoxia, vascular congestion, with stable.  Reports compliance with Lasix-20 mg twice daily.  Not compliant with low-salt diet.  Last echo 01/2019 EF 55 to 60%, BNP- elevated at 260 compared to recent BNP 201 for CHF exacerbation.  IV Lasix 60 mg x 1 given in ED. -Continue with IV Lasix 40 twice Q12H -Input output, daily weights, fluid restriction -BMP a.m.  Hypertension-systolic [id number]. -Resume home Cardizem and metoprolol  Diabetes mellitus-random glucose 101.  Recent A1c 01/2019  7.7.  Home medications Lantus 60 twice daily. -Resume home Lantus at reduced dose 20 units BID. - SSI -Hold once weekly Trulicity for now.  Atrial fibrillation- rate controlled.  On rate limiting medications and  anticoagulated. -Resume home Cardizem, metoprolol and Eliquis.  Asthma, pulmonary hypertension-appears stable.  No wheezing on exam.  No cough. -Resume home bronchodilators as Dulera -PRN duo nebs  Depression, bipolar disorder, chronic back pain-on multiple psychoactive medications. -Resume home Lyrica, and Abilify, carbamazepine, topiramate, PRN ativan -Hold trazodone and eszopiclone for now, while on Ativan. -Tramadol x1 for back pain.  DVT prophylaxis: Lovenox Code Status: Full code Family Communication: Spouse at bedside Disposition Plan: Per rounding team Consults called: None Admission status: Obsevation, telemetry   Bethena Roys MD [facility]  02/01/2019, 8:32 PM

## 2019-02-01 NOTE — ED Provider Notes (Signed)
[facility] EMERGENCY DEPARTMENT Provider Note   CSN: [id number] Arrival date & time: 02/01/19  1319     History   Chief Complaint Chief Complaint  Patient presents with  . Shortness of Breath    HPI Amber Stephenson is a 73 y.o. female with past medical history of permanent A. fib on Eliquis, coronary artery disease, congestive heart failure, pulmonary hypertension, renal insufficiency, hypertension, diabetes, presented to the emergency department shortness of breath.  The patient presents as a transfer from her 56 office where she was noted to desaturate to pulse ox of 64%.  Patient reports feeling short of breath in particular for the past 2 days.  She reports recent injuries in the past month of her left and right lower extremity.  She was scheduled for right shoulder surgery in August, but it was canceled after complications with anesthesia.  She reports her Eliquis was stopped for 2 days during that time.  She reports she does not use any baseline oxygen.  She is able to sleep on her back.  She denies any leg swelling or recent weight gain.  She reports chronic dyspnea on exertion, and is unable to walk 1 block at baseline.   HPI  Past Medical History:  Diagnosis Date  . Atrial fibrillation (HCC)    PERMANENT  . Bipolar affective ([place])   . CAD (coronary artery disease)    NONOBSTRUCTIVE CAD; MGD BY DR. Bronson Ing   . CHF (congestive heart failure) (HCC)    DIASTOLIC  . Chronic back pain    LOWER   . Diabetes mellitus without complication ([place])    TYPE 2   . Hyperlipidemia   . Hypertension   . Morbid obesity due to excess calories ([place]) 07/06/2018  . Pain management   . Pulmonary HTN ([place])   . Renal insufficiency    DR [facility]      Patient Active Problem List   Diagnosis Date Noted  . Hypoxia 02/01/2019  . Acute on chronic respiratory failure with hypoxia ([place]) 01/22/2019  . Obesity (BMI 30-39.9) 07/06/2018  . At risk for sleep apnea 07/06/2018  . Closed  fracture of proximal end of left humerus 03/16/2018  . Left humeral fracture 03/06/2018  . Fracture of four ribs of left side, closed, initial encounter 03/06/2018  . Mild intermittent asthma without complication [id number]  . Cardiomegaly 01/12/2018  . NAFL (nonalcoholic fatty liver) 123456  . Age-related osteoporosis without current pathological fracture 12/15/2017  . Asthma 11/12/2017  . Atrial fibrillation with RVR ([place]) 05/19/2017  . Microscopic colitis 05/19/2017  . Hypotension 05/19/2017  . Fibromyalgia 03/19/2017  . Healthcare maintenance 02/12/2016  . Migraine headache with aura 02/12/2016  . Diabetic neuropathy ([place]) 02/06/2016  . Depression   . Herpes genitalis in women 07/16/2015  . Chronic anticoagulation 05/30/2015  . Family history of coronary artery disease in sister 05/30/2015  . Chronic diastolic CHF (congestive heart failure) ([place]) 05/30/2015  . Chest pain with moderate risk of acute coronary syndrome 05/29/2015  . Dyspnea on exertion   . Essential hypertension   . RLS (restless legs syndrome) 04/27/2015  . Lipoma 02/08/2015  . Bipolar disorder ([place]) 01/23/2015  . Insomnia 01/23/2015  . Chronic back pain 01/04/2015  . Permanent atrial fibrillation 01/04/2015  . DM2 (diabetes mellitus, type 2) ([place])   . Hyperlipidemia     Past Surgical History:  Procedure Laterality Date  . BREAST REDUCTION SURGERY    . EYE SURGERY Right    cateracts  . HAMMER  TOE SURGERY    . LEFT HEART CATH AND CORONARY ANGIOGRAPHY N/A 02/03/2018   Procedure: LEFT HEART CATH AND CORONARY ANGIOGRAPHY;  Surgeon: Martinique, Peter M, MD;  Location: [place] [facility];  Service: Cardiovascular;  Laterality: N/A;  . SHOULDER SURGERY Right    "I BROKE MY SHOUDLER  . THIGH SURGERY     "TO REMOVE A TUMOR "     OB History   No obstetric history on file.      Home Medications    Prior to Admission medications   Medication Sig Start Date End Date Taking? Authorizing Provider   acetaminophen (TYLENOL) 325 MG tablet Take 325-650 mg by mouth every 6 (six) hours as needed for mild pain, moderate pain or headache.    [provider]  apixaban (ELIQUIS) 5 MG TABS tablet Take 1 tablet (5 mg total) by mouth 2 (two) times daily. 12/06/18   Loman Brooklyn, FNP  ARIPiprazole (ABILIFY) 10 MG tablet Take 3 tablets (30 mg total) by mouth daily. 12/23/18   Plovsky, Berneta Sages, MD  atorvastatin (LIPITOR) 40 MG tablet TAKE 1 TABLET DAILY Patient taking differently: Take 40 mg by mouth daily.  09/13/18   [name], DO  Blood Glucose Monitoring Suppl (FREESTYLE LITE) DEVI Use to check blood sugar tid. DX E11.22 04/04/15   Timmothy Euler, MD  budesonide-formoterol (SYMBICORT) 80-4.5 MCG/ACT inhaler Inhale 2 puffs into the lungs 2 (two) times daily. Patient taking differently: Inhale 2 puffs into the lungs 2 (two) times daily as needed (shortness of breath).  03/04/18   [name], DO  carbamazepine (CARBATROL) 100 MG 12 hr capsule Take 1 capsule (100 mg total) by mouth daily. 12/23/18 12/23/19  Plovsky, Berneta Sages, MD  Cholecalciferol (VITAMIN D3) 1000 UNITS CAPS Take 1,000 Units by mouth daily.    [provider]  diltiazem (CARDIZEM CD) 120 MG 24 hr capsule TAKE 1 CAPSULE AT BEDTIME Patient taking differently: Take 120 mg by mouth at bedtime.  11/04/18   Herminio Commons, MD  Dulaglutide (TRULICITY) 1.5 0000000 SOPN Inject 1.5 mg into the skin once a week. 08/20/17   Timmothy Euler, MD  Eszopiclone (ESZOPICLONE) 3 MG TABS Take 1 tablet (3 mg total) by mouth at bedtime as needed. Take immediately before bedtime Patient taking differently: Take 3 mg by mouth at bedtime as needed (sleep). Take immediately before bedtime 12/23/18 12/23/19  Norma Fredrickson, MD  furosemide (LASIX) 20 MG tablet TAKE 1 TABLET TWICE A DAY 01/26/19   Ronnie Doss M, DO  glucose blood (FREESTYLE LITE) test strip USE FOR TESTING SUGAR THREE TIMES DAILY 06/25/18   Ronnie Doss M,  DO  Icosapent Ethyl (VASCEPA) 1 g CAPS Take 2 capsules (2 g total) by mouth 2 (two) times daily with a meal. 11/15/18   Gottschalk, Ashly M, DO  Insulin Glargine (LANTUS SOLOSTAR) 100 UNIT/ML Solostar Pen INJECT 60 UNITS UNDER THE SKIN TWICE A DAY Patient taking differently: Inject 30 Units into the skin 2 (two) times daily.  01/25/18   Herminio Commons, MD  Insulin Pen Needle (SURE COMFORT PEN NEEDLES) 32G X 4 MM MISC Use with insulin pens as directed. Dx E11.9 08/16/15   Timmothy Euler, MD  Lancets (FREESTYLE) lancets Test tid. DX E11.22 08/12/17   Timmothy Euler, MD  LORazepam (ATIVAN) 1 MG tablet Take 1 tab (1 mg) in the morning and 2 tabs (2 mg ) at bedtime Patient taking differently: Take 1-2 mg by mouth See admin instructions.  Take 1 tab (1 mg) in the morning and 2 tabs (2 mg ) at bedtime 12/23/18   Plovsky, Berneta Sages, MD  metoprolol tartrate (LOPRESSOR) 100 MG tablet TAKE 1 TABLET TWICE A DAY WITH METOPROLOL 25 MG TO EQUAL 125 MG Patient taking differently: Take 100 mg by mouth 2 (two) times daily. Take 100mg  + 25mg  for a total of 125mg  twice a day. 11/15/18   Herminio Commons, MD  metoprolol tartrate (LOPRESSOR) 25 MG tablet Take 1 tablet (25 mg total) by mouth 2 (two) times daily. Take with metoprolol 100 mg to equal 125 mg Patient taking differently: Take 25 mg by mouth 2 (two) times daily. Take 100mg  + 25mg  for a total of 125mg  twice a day. 05/24/18   Herminio Commons, MD  naloxone [facility]) nasal spray 4 mg/0.1 mL Place 1 spray into the nose once as needed (opiod overdose).    [provider]  nystatin (MYCOSTATIN/NYSTOP) powder Apply topically 3 (three) times daily. x2 weeks (for groin/breast rash) Patient taking differently: Apply 1 g topically 3 (three) times daily as needed (rash).  10/26/18   [name], DO  polyvinyl alcohol (LIQUIFILM TEARS) 1.4 % ophthalmic solution Place 1 drop into both eyes as needed for dry eyes.    [provider]   pregabalin (LYRICA) 150 MG capsule Take 1 capsule (150 mg total) by mouth 2 (two) times daily. 07/28/18 07/28/19  Plovsky, Berneta Sages, MD  rizatriptan (MAXALT-MLT) 10 MG disintegrating tablet DISSOLVE 1 TABLET ON THE TONGUE AT FIRST SIGN OF HEADACHE. MAY REPEAT ONE DOSE IN 1 HOUR IF NEEDED Patient taking differently: Take 10 mg by mouth See admin instructions. DISSOLVE 1 TABLET ON THE TONGUE AT FIRST SIGN OF HEADACHE. MAY REPEAT ONE DOSE IN 1 HOUR IF NEEDED 11/23/18   Ronnie Doss M, DO  topiramate (TOPAMAX) 25 MG tablet Take 2 tablets every night 01/26/19   Cameron Sprang, MD  traZODone (DESYREL) 100 MG tablet 4  qhs Patient taking differently: Take 400 mg by mouth at bedtime.  12/23/18   Norma Fredrickson, MD    Family History Family History  Problem Relation Age of Onset  . Diabetes Mother   . Heart disease Mother   . Heart disease Brother   . Heart disease Sister        CABG  . Alcohol abuse Sister   . Mental illness Brother   . Diabetes Brother     Social History Social History   Tobacco Use  . Smoking status: Never Smoker  . Smokeless tobacco: Never Used  Substance Use Topics  . Alcohol use: No    Alcohol/week: 0.0 standard drinks  . Drug use: No     Allergies   Ivp dye [iodinated diagnostic agents] and Latex   Review of Systems Review of Systems  Constitutional: Negative for chills and fever.  Eyes: Negative for pain and visual disturbance.  Respiratory: Positive for shortness of breath. Negative for cough.   Cardiovascular: Negative for chest pain and palpitations.  Gastrointestinal: Negative for abdominal pain and vomiting.  Neurological: Negative for seizures and syncope.  All other systems reviewed and are negative.    Physical Exam Updated Vital Signs BP 100/73   Ht 5\' 2"  (1.575 m)   Wt 89.4 kg   SpO2 90%   BMI 36.03 kg/m   Physical Exam Vitals signs and nursing note reviewed.  Constitutional:      Appearance: She is well-developed.  HENT:      Head: Normocephalic and  atraumatic.  Eyes:     Conjunctiva/sclera: Conjunctivae normal.  Neck:     Musculoskeletal: Normal range of motion and neck supple.  Cardiovascular:     Rate and Rhythm: Normal rate. Rhythm irregular.  Pulmonary:     Effort: Pulmonary effort is normal. No accessory muscle usage or respiratory distress.     Comments: 94% on 2L [place] Desaturation to 85% while moving into bed Abdominal:     Palpations: Abdomen is soft.     Tenderness: There is no abdominal tenderness.  Skin:    General: Skin is warm and dry.  Neurological:     Mental Status: She is alert.      ED Treatments / Results  Labs (all labs ordered are listed, but only abnormal results are displayed) Labs Reviewed  BASIC METABOLIC PANEL - Abnormal; Notable for the following components:      Result Value   Glucose, Bld 101 (*)    Creatinine, Ser 1.09 (*)    GFR calc non Af Amer 51 (*)    GFR calc Af Amer 59 (*)    All other components within normal limits  BRAIN NATRIURETIC PEPTIDE - Abnormal; Notable for the following components:   B Natriuretic Peptide 260.0 (*)    All other components within normal limits  SARS CORONAVIRUS 2 (HOSPITAL ORDER, [place] [facility])  CBC WITH DIFFERENTIAL/PLATELET  D-DIMER, QUANTITATIVE (NOT AT [facility])  TROPONIN I (HIGH SENSITIVITY)  TROPONIN I (HIGH SENSITIVITY)    EKG EKG Interpretation  Date/Time:  Tuesday February 01 2019 14:05:58 EDT Ventricular Rate:  62 PR Interval:    QRS Duration: 79 QT Interval:  463 QTC Calculation: 463 R Axis:   -26 Text Interpretation:  Atrial fibrillation Borderline left axis deviation RSR' in V1 or V2, right VCD or RVH Repol abnrm suggests ischemia, anterolateral Since last tracing rate slower Confirmed by Noemi Chapel [phone]) on 02/01/2019 3:35:37 PM   Radiology Dg Chest Port 1 View  Result Date: 02/01/2019 CLINICAL DATA:  Short of breath EXAM: PORTABLE CHEST 1 VIEW COMPARISON:  01/23/2019, 03/06/2018  FINDINGS: Cardiomegaly with vascular congestion. Asymmetric hazy opacity right thorax, likely rotation and overlying soft tissues. Convex opacity left lower mediastinal silhouette presumably slight unfolding of aorta IMPRESSION: 1. Cardiomegaly with vascular congestion 2. Asymmetric hazy opacity of right thorax, likely due to patient rotation and overlying soft tissue. Alternatively, findings could be secondary to a layering effusion and/or asymmetric edema. Electronically Signed   By: Donavan Foil M.D.   On: 02/01/2019 14:41    Procedures Procedures (including critical care time)  Medications Ordered in ED Medications  furosemide (LASIX) injection 60 mg (has no administration in time range)     Initial Impression / Assessment and Plan / ED Course  I have reviewed the triage vital signs and the nursing notes.  Pertinent labs & imaging results that were available during my care of the patient were reviewed by me and considered in my medical decision making (see chart for details).  Patient is 73 year old female with pulmonary hypertension and A. fib on Eliquis presenting to the emergency department with hypoxia and shortness of breath.  She was noted to desat into 68% on her family medicine clinic.  Per Evelina Dun FNP note from today there was some concern about polypharmacy with patient's oxycodone and ativan prescriptions; however the patient denies taking more than prescribed and does not appear somnalent to me, and demonstrates hypoxia on exertion here.  Differential includes ACS versus pulmonary embolism  versus pneumonia versus COVID versus CHF versus other  Clinically she does not demonstrate with acute volume overload.  Will check a BMP, chest x-ray, and EKG.    We will check a d-dimer to evaluate her risk for possible pulmonary embolism.    She is stable at this time and does not require increased respiratory support.  Clinical Course as of Feb 01 1640  Tue Feb 01, 2019  1527  D-Dimer, Quant: 0.28 [MT]  1527 Troponin I (High Sensitivity): 7 [MT]  1534 SARS Coronavirus 2: NEGATIVE [MT]  [id number] Patient been breathing comfortably on 2 L nasal cannula since arrival in the ED.  This is a new oxygen requirement for her, although she has not required increasing amounts of respiratory support. I With a negative d-dimer I have a much lower suspicion for CT PE.  I will admit her to the hospitalist for further observation and monitoring.   [MT]  1623 I spoke to the hospitalist who will come see the patient.   [MT]    Clinical Course User Index [MT] Nichael Ehly, Carola Rhine, MD       Final Clinical Impressions(s) / ED Diagnoses   Final diagnoses:  Hypoxia  SOB (shortness of breath)    ED Discharge Orders    None       Wyvonnia Dusky, MD 02/01/19 [phone]

## 2019-02-01 NOTE — ED Triage Notes (Signed)
Pt was at [facility] and had sudden SOB. When walking around staff stated she was 64%.  Per EMS, pt was 94% on RA. Pt history of CHF, A fib, and Dyspnea on exertion. CBG 115.

## 2019-02-02 DIAGNOSIS — J45909 Unspecified asthma, uncomplicated: Secondary | ICD-10-CM | POA: Diagnosis present

## 2019-02-02 DIAGNOSIS — J9601 Acute respiratory failure with hypoxia: Secondary | ICD-10-CM | POA: Diagnosis present

## 2019-02-02 DIAGNOSIS — Z20828 Contact with and (suspected) exposure to other viral communicable diseases: Secondary | ICD-10-CM | POA: Diagnosis present

## 2019-02-02 DIAGNOSIS — Z8249 Family history of ischemic heart disease and other diseases of the circulatory system: Secondary | ICD-10-CM | POA: Diagnosis not present

## 2019-02-02 DIAGNOSIS — I4821 Permanent atrial fibrillation: Secondary | ICD-10-CM | POA: Diagnosis present

## 2019-02-02 DIAGNOSIS — Z79899 Other long term (current) drug therapy: Secondary | ICD-10-CM | POA: Diagnosis not present

## 2019-02-02 DIAGNOSIS — K5903 Drug induced constipation: Secondary | ICD-10-CM | POA: Diagnosis present

## 2019-02-02 DIAGNOSIS — I509 Heart failure, unspecified: Secondary | ICD-10-CM | POA: Diagnosis not present

## 2019-02-02 DIAGNOSIS — G43909 Migraine, unspecified, not intractable, without status migrainosus: Secondary | ICD-10-CM | POA: Diagnosis not present

## 2019-02-02 DIAGNOSIS — K760 Fatty (change of) liver, not elsewhere classified: Secondary | ICD-10-CM | POA: Diagnosis present

## 2019-02-02 DIAGNOSIS — R0602 Shortness of breath: Secondary | ICD-10-CM | POA: Diagnosis present

## 2019-02-02 DIAGNOSIS — E785 Hyperlipidemia, unspecified: Secondary | ICD-10-CM | POA: Diagnosis present

## 2019-02-02 DIAGNOSIS — I5033 Acute on chronic diastolic (congestive) heart failure: Secondary | ICD-10-CM | POA: Diagnosis not present

## 2019-02-02 DIAGNOSIS — F1120 Opioid dependence, uncomplicated: Secondary | ICD-10-CM | POA: Diagnosis present

## 2019-02-02 DIAGNOSIS — I2510 Atherosclerotic heart disease of native coronary artery without angina pectoris: Secondary | ICD-10-CM | POA: Diagnosis present

## 2019-02-02 DIAGNOSIS — I110 Hypertensive heart disease with heart failure: Secondary | ICD-10-CM | POA: Diagnosis present

## 2019-02-02 DIAGNOSIS — Z794 Long term (current) use of insulin: Secondary | ICD-10-CM | POA: Diagnosis not present

## 2019-02-02 DIAGNOSIS — G8929 Other chronic pain: Secondary | ICD-10-CM | POA: Diagnosis present

## 2019-02-02 DIAGNOSIS — F3130 Bipolar disorder, current episode depressed, mild or moderate severity, unspecified: Secondary | ICD-10-CM | POA: Diagnosis present

## 2019-02-02 DIAGNOSIS — R0902 Hypoxemia: Secondary | ICD-10-CM | POA: Diagnosis not present

## 2019-02-02 DIAGNOSIS — E119 Type 2 diabetes mellitus without complications: Secondary | ICD-10-CM | POA: Diagnosis present

## 2019-02-02 DIAGNOSIS — I272 Pulmonary hypertension, unspecified: Secondary | ICD-10-CM | POA: Diagnosis present

## 2019-02-02 DIAGNOSIS — Z833 Family history of diabetes mellitus: Secondary | ICD-10-CM | POA: Diagnosis not present

## 2019-02-02 DIAGNOSIS — F411 Generalized anxiety disorder: Secondary | ICD-10-CM | POA: Diagnosis present

## 2019-02-02 DIAGNOSIS — T402X5A Adverse effect of other opioids, initial encounter: Secondary | ICD-10-CM | POA: Diagnosis present

## 2019-02-02 DIAGNOSIS — Z7901 Long term (current) use of anticoagulants: Secondary | ICD-10-CM | POA: Diagnosis not present

## 2019-02-02 LAB — BASIC METABOLIC PANEL
Anion gap: 5 (ref 5–15)
BUN: 19 mg/dL (ref 8–23)
CO2: 28 mmol/L (ref 22–32)
Calcium: 8.5 mg/dL — ABNORMAL LOW (ref 8.9–10.3)
Chloride: 110 mmol/L (ref 98–111)
Creatinine, Ser: 1.03 mg/dL — ABNORMAL HIGH (ref 0.44–1.00)
GFR calc Af Amer: >60 mL/min (ref >60)
GFR calc non Af Amer: 55 mL/min — ABNORMAL LOW (ref >60)
Glucose, Bld: 119 mg/dL — ABNORMAL HIGH (ref 70–99)
Potassium: 3.7 mmol/L (ref 3.5–5.1)
Sodium: 143 mmol/L (ref 135–145)

## 2019-02-02 LAB — GLUCOSE, CAPILLARY
Glucose-Capillary: 101 mg/dL — ABNORMAL HIGH (ref 70–99)
Glucose-Capillary: 143 mg/dL — ABNORMAL HIGH (ref 70–99)
Glucose-Capillary: 209 mg/dL — ABNORMAL HIGH (ref 70–99)
Glucose-Capillary: 174 mg/dL — ABNORMAL HIGH (ref 70–99)

## 2019-02-02 LAB — MAGNESIUM: Magnesium: 1.9 mg/dL (ref 1.7–2.4)

## 2019-02-02 MED ORDER — ALUM & MAG HYDROXIDE-SIMETH 200-200-20 MG/5ML PO SUSP
30.0000 mL | Freq: Once | ORAL | Status: DC
  Filled 2019-02-02 (×2): qty 30

## 2019-02-02 MED ORDER — SUMATRIPTAN SUCCINATE 50 MG PO TABS: 25.0000 mg | ORAL_TABLET | ORAL | Status: DC | PRN

## 2019-02-02 MED ORDER — ACETAMINOPHEN 650 MG RE SUPP
650.0000 mg | Freq: Four times a day (QID) | RECTAL | Status: DC
  Administered 2019-02-03: 650 mg via RECTAL
  Filled 2019-02-02 (×2): qty 1

## 2019-02-02 MED ORDER — OXYCODONE HCL 5 MG PO TABS
5.0000 mg | ORAL_TABLET | ORAL | Status: AC | PRN
  Administered 2019-02-02 – 2019-02-03 (×3): 5 mg via ORAL
  Filled 2019-02-02 (×3): qty 1

## 2019-02-02 MED ORDER — SUMATRIPTAN SUCCINATE 50 MG PO TABS
50.0000 mg | ORAL_TABLET | ORAL | Status: DC | PRN
  Administered 2019-02-02: 50 mg via ORAL
  Filled 2019-02-02: qty 1

## 2019-02-02 MED ORDER — ACETAMINOPHEN 325 MG PO TABS
650.0000 mg | ORAL_TABLET | Freq: Four times a day (QID) | ORAL | Status: DC
  Administered 2019-02-02 – 2019-02-04 (×6): 650 mg via ORAL
  Filled 2019-02-02 (×8): qty 2

## 2019-02-02 NOTE — Progress Notes (Signed)
C/O left flank pain below left lung which tylenol did not relieve. Contacted Dr. Wynetta Emery and he ordered oxycodone and maalox.

## 2019-02-02 NOTE — Progress Notes (Signed)
PROGRESS NOTE    Amber Stephenson  [id number]  DOB: [birth year]  DOA: 02/01/2019 PCP: Sharion Balloon, FNP   Brief Admission Hx: 73 y/o female with afib, diastolic CHF,  Bipolar, depression, pHTN, DM, NAFLD presents with SOB and difficulty breathing diagnosed with acute diastolic CHF exacerbation.   MDM/Assessment & Plan:   1. Acute respiratory failure with hypoxia - Pt presented with hypoxia and clinical findings of CHF.  He is being treated supportively and feeling better. Obtain PT evaluation.    2. Acute diastolic CHF exacerbation - continue IV lasix for diuresis.  Pt clinically reporting improvement. Ambulate.  3. Essential hypertension -resumed home medications.  4. Type 2 DM - continue to monitor CBGs and SSI coverage.  5. Chronic atrial fibrillation - resumed home meds, rate control, follow.  6. PHTN/Asthma - stable follow clinically, resumed home bronchodilators.  7. Bipolar Depression - resumed home meds as ordered.  8. GAD - resume lorazepam 9. Opioid dependence - holding opioids in setting of confusion, per  controlled substance database, pt receives monthly oxycodone 5 mg #120 and lorazepam 1 mg #90 per month.  10. Migraine headache - triptan ordered as needed.   DVT prophylaxis: apixaban  Code Status: Full  Family Communication:  Disposition Plan: inpatient    Consultants:    Procedures:    Antimicrobials:     Subjective: Pt reports migraine headache.    Objective: Vitals:   02/01/19 2207 02/02/19 0500 02/02/19 0634 02/02/19 0755  BP: 108/74  108/75   Pulse: 69  (!) 57   Resp: 20  20   Temp: 98.3 F (36.8 C)  98.1 F (36.7 C)   TempSrc: Oral  Oral   SpO2: 99%  99% 97%  Weight:  93.6 kg    Height:        Intake/Output Summary (Last 24 hours) at 02/02/2019 1345 Last data filed at 02/02/2019 0900 Gross per 24 hour  Intake 240 ml  Output 1340 ml  Net -1100 ml   Filed Weights   02/01/19 1334 02/02/19 0500  Weight: 89.4 kg 93.6 kg     REVIEW OF SYSTEMS  As per history otherwise all reviewed and reported negative  Exam:  General exam: awake, alert, NAD, cooperative.  Respiratory system: Clear. No increased work of breathing. Cardiovascular system: S1 & S2 heard. No JVD, murmurs, gallops, clicks or pedal edema. Gastrointestinal system: Abdomen is nondistended, soft and nontender. Normal bowel sounds heard. Central nervous system: Alert and oriented. No focal neurological deficits. Extremities: no CCE.  Data Reviewed: Basic Metabolic Panel: Recent Labs  Lab 02/01/19 1421 02/02/19 0622  NA 141 143  K 3.5 3.7  CL 106 110  CO2 25 28  GLUCOSE 101* 119*  BUN 20 19  CREATININE 1.09* 1.03*  CALCIUM 8.9 8.5*  MG  --  1.9   Liver Function Tests: No results for input(s): AST, ALT, ALKPHOS, BILITOT, PROT, ALBUMIN in the last 168 hours. No results for input(s): LIPASE, AMYLASE in the last 168 hours. No results for input(s): AMMONIA in the last 168 hours. CBC: Recent Labs  Lab 02/01/19 1421  WBC 8.9  NEUTROABS 5.0  HGB 13.7  HCT 42.6  MCV 94.9  PLT 163   Cardiac Enzymes: No results for input(s): CKTOTAL, CKMB, CKMBINDEX, TROPONINI in the last 168 hours. CBG (last 3)  Recent Labs    02/01/19 2207 02/02/19 0819 02/02/19 1103  GLUCAP 161* 101* 143*   Recent Results (from the past 240 hour(s))  SARS Coronavirus  2 [facility] order, Performed in [facility] hospital lab) Nasopharyngeal Nasopharyngeal Swab     Status: None   Collection Time: 02/01/19  1:50 PM   Specimen: Nasopharyngeal Swab  Result Value Ref Range Status   SARS Coronavirus 2 NEGATIVE NEGATIVE Final    Comment: (NOTE) If result is NEGATIVE SARS-CoV-2 target nucleic acids are NOT DETECTED. The SARS-CoV-2 RNA is generally detectable in upper and lower  respiratory specimens during the acute phase of infection. The lowest  concentration of SARS-CoV-2 viral copies this assay can detect is 250  copies / mL. A negative result does not preclude  SARS-CoV-2 infection  and should not be used as the sole basis for treatment or other  patient management decisions.  A negative result may occur with  improper specimen collection / handling, submission of specimen other  than nasopharyngeal swab, presence of viral mutation(s) within the  areas targeted by this assay, and inadequate number of viral copies  (<250 copies / mL). A negative result must be combined with clinical  observations, patient history, and epidemiological information. If result is POSITIVE SARS-CoV-2 target nucleic acids are DETECTED. The SARS-CoV-2 RNA is generally detectable in upper and lower  respiratory specimens dur ing the acute phase of infection.  Positive  results are indicative of active infection with SARS-CoV-2.  Clinical  correlation with patient history and other diagnostic information is  necessary to determine patient infection status.  Positive results do  not rule out bacterial infection or co-infection with other viruses. If result is PRESUMPTIVE POSTIVE SARS-CoV-2 nucleic acids MAY BE PRESENT.   A presumptive positive result was obtained on the submitted specimen  and confirmed on repeat testing.  While 2019 novel coronavirus  (SARS-CoV-2) nucleic acids may be present in the submitted sample  additional confirmatory testing may be necessary for epidemiological  and / or clinical management purposes  to differentiate between  SARS-CoV-2 and other Sarbecovirus currently known to infect humans.  If clinically indicated additional testing with an alternate test  methodology [phone]) is advised. The SARS-CoV-2 RNA is generally  detectable in upper and lower respiratory sp ecimens during the acute  phase of infection. The expected result is Negative. Fact Sheet for Patients:  StrictlyIdeas.no Fact Sheet for Healthcare Providers: BankingDealers.co.za This test is not yet approved or cleared by the  [place] FDA and has been authorized for detection and/or diagnosis of SARS-CoV-2 by FDA under an Emergency Use Authorization (EUA).  This EUA will remain in effect (meaning this test can be used) for the duration of the COVID-19 declaration under Section 564(b)(1) of the Act, 21 U.S.C. section 360bbb-3(b)(1), unless the authorization is terminated or revoked sooner. Performed at [facility], [address], [place], [place] [address]      Studies: Dg Chest [facility] 1 View  Result Date: 02/01/2019 CLINICAL DATA:  Short of breath EXAM: PORTABLE CHEST 1 VIEW COMPARISON:  01/23/2019, 03/06/2018 FINDINGS: Cardiomegaly with vascular congestion. Asymmetric hazy opacity right thorax, likely rotation and overlying soft tissues. Convex opacity left lower mediastinal silhouette presumably slight unfolding of aorta IMPRESSION: 1. Cardiomegaly with vascular congestion 2. Asymmetric hazy opacity of right thorax, likely due to patient rotation and overlying soft tissue. Alternatively, findings could be secondary to a layering effusion and/or asymmetric edema. Electronically Signed   By: Donavan Foil M.D.   On: 02/01/2019 14:41     Scheduled Meds: . apixaban  5 mg Oral BID  . ARIPiprazole  30 mg Oral Daily  . atorvastatin  40 mg  Oral Daily  . carbamazepine  100 mg Oral Daily  . diltiazem  120 mg Oral QHS  . feeding supplement (ENSURE ENLIVE)  237 mL Oral BID BM  . furosemide  40 mg Intravenous Q12H  . insulin aspart  0-9 Units Subcutaneous TID WC  . insulin glargine  20 Units Subcutaneous BID  . metoprolol tartrate  125 mg Oral BID  . mometasone-formoterol  2 puff Inhalation BID  . pregabalin  150 mg Oral BID  . topiramate  50 mg Oral QHS   Continuous Infusions:  Active Problems:   Hypoxia   Acute on chronic diastolic CHF (congestive heart failure) ([place])   Time spent:   Irwin Brakeman, MD [facility] 02/02/2019, 1:45 PM    LOS: 0 days  How to contact the [facility] Attending or  Consulting provider [place] or covering provider during after hours [place], for this patient?  1. Check the care team in [facility] and look for a) attending/consulting [facility] provider listed and b) the [facility] team listed 2. Log into [email or web address] and use [place]'s universal password to access. If you do not have the password, please contact the hospital operator. 3. Locate the [facility] provider you are looking for under [facility] and page to a number that you can be directly reached. 4. If you still have difficulty reaching the provider, please page the [facility] (Director on Call) for the Hospitalists listed on amion for assistance.

## 2019-02-02 NOTE — Progress Notes (Signed)
Tele called earlier and had 2.18 sec pause.  Just ambulated to bathroom on room air and sats were in lower to mid 90's. Contacted Dr. Wynetta Emery with this information.

## 2019-02-03 ENCOUNTER — Inpatient Hospital Stay (HOSPITAL_COMMUNITY): Payer: Medicare Other

## 2019-02-03 LAB — GLUCOSE, CAPILLARY
Glucose-Capillary: 135 mg/dL — ABNORMAL HIGH (ref 70–99)
Glucose-Capillary: 248 mg/dL — ABNORMAL HIGH (ref 70–99)
Glucose-Capillary: 157 mg/dL — ABNORMAL HIGH (ref 70–99)
Glucose-Capillary: 229 mg/dL — ABNORMAL HIGH (ref 70–99)

## 2019-02-03 LAB — BASIC METABOLIC PANEL
Anion gap: 9 (ref 5–15)
BUN: 21 mg/dL (ref 8–23)
CO2: 29 mmol/L (ref 22–32)
Calcium: 9 mg/dL (ref 8.9–10.3)
Chloride: 104 mmol/L (ref 98–111)
Creatinine, Ser: 0.95 mg/dL (ref 0.44–1.00)
GFR calc Af Amer: >60 mL/min (ref >60)
GFR calc non Af Amer: >60 mL/min (ref >60)
Glucose, Bld: 132 mg/dL — ABNORMAL HIGH (ref 70–99)
Potassium: 3.5 mmol/L (ref 3.5–5.1)
Sodium: 142 mmol/L (ref 135–145)

## 2019-02-03 MED ORDER — ALUM & MAG HYDROXIDE-SIMETH 200-200-20 MG/5ML PO SUSP
30.0000 mL | Freq: Once | ORAL | Status: AC
  Administered 2019-02-03: 30 mL via ORAL

## 2019-02-03 MED ORDER — POTASSIUM CHLORIDE CRYS ER 20 MEQ PO TBCR
20.0000 meq | EXTENDED_RELEASE_TABLET | Freq: Two times a day (BID) | ORAL | Status: DC
  Administered 2019-02-03 – 2019-02-04 (×2): 20 meq via ORAL
  Filled 2019-02-03 (×2): qty 1

## 2019-02-03 MED ORDER — SENNOSIDES-DOCUSATE SODIUM 8.6-50 MG PO TABS
2.0000 | ORAL_TABLET | Freq: Two times a day (BID) | ORAL | Status: DC
  Administered 2019-02-03 – 2019-02-04 (×3): 2 via ORAL
  Filled 2019-02-03 (×3): qty 2

## 2019-02-03 MED ORDER — OXYCODONE HCL 5 MG PO TABS: 5.0000 mg | ORAL_TABLET | ORAL | Status: DC | PRN

## 2019-02-03 MED ORDER — POLYETHYLENE GLYCOL 3350 17 G PO PACK
17.0000 g | PACK | Freq: Two times a day (BID) | ORAL | Status: DC
  Administered 2019-02-03 – 2019-02-04 (×2): 17 g via ORAL
  Filled 2019-02-03 (×2): qty 1

## 2019-02-03 MED ORDER — FUROSEMIDE 20 MG PO TABS
20.0000 mg | ORAL_TABLET | Freq: Two times a day (BID) | ORAL | Status: DC
  Administered 2019-02-04: 20 mg via ORAL
  Filled 2019-02-03: qty 1

## 2019-02-03 MED ORDER — BISACODYL 10 MG RE SUPP
10.0000 mg | Freq: Two times a day (BID) | RECTAL | Status: AC
  Administered 2019-02-03: 10 mg via RECTAL
  Filled 2019-02-03: qty 1

## 2019-02-03 NOTE — Evaluation (Signed)
Physical Therapy Evaluation Patient Details Name: Amber Stephenson MRN: [id number] DOB: 1945-10-31 Today's Date: 02/03/2019   History of Present Illness  Amber Stephenson is a 73 y.o. female with medical history significant for atrial fibrillation, diastolic CHF, depression, bipolar disorder, asthma, NAFLD, pulmonary hypertension, diabetes mellitus, presented to the ED with complaints of difficulty breathing over the past 2 days.  She reports stable 3 pillow orthopnea.  Patient saw her primary care provider and O2 sats was 64% on room air.  She is not on home O2.  On 2 L nasal cannula O2 sats improved to 96%.  Patient denies cough, she denies lower extremity swelling, she denies abdominal bloating or weight gain.  She reports her stable weight is at 197 and this is unchanged, weight in ED is 196 pounds.  No chest pain, no fever no chills.  She reports compliance with her Lasix,  but she is not compliant with a low-salt diet.    Clinical Impression  Patient limited for functional mobility as stated below secondary to BLE weakness, fatigue and fair/poor standing balance.  Patient unsafe to attempt ambulation without AD due to increased pain in LLE, required use of RW for safety, limited secondary to c/o fatigue and tolerated sitting up in chair with her spouse present in room - RN notified.  Patient will benefit from continued physical therapy in hospital and recommended venue below to increase strength, balance, endurance for safe ADLs and gait.    Follow Up Recommendations Home health PT;Supervision for mobility/OOB;Supervision - Intermittent    Equipment Recommendations  None recommended by PT    Recommendations for Other Services       Precautions / Restrictions Precautions Precautions: Fall Restrictions Weight Bearing Restrictions: No      Mobility  Bed Mobility Overal bed mobility: Needs Assistance Bed Mobility: Supine to Sit     Supine to sit: Min assist     General bed  mobility comments: slow labored movement  Transfers Overall transfer level: Needs assistance Equipment used: Rolling walker (2 wheeled);None Transfers: Sit to/from [employer] to Stand: Min guard Stand pivot transfers: Min guard       General transfer comment: unsteady without AD, required RW for safety  Ambulation/Gait Ambulation/Gait assistance: Min guard Gait Distance (Feet): 65 Feet Assistive device: Rolling walker (2 wheeled) Gait Pattern/deviations: Decreased step length - right;Decreased step length - left;Decreased stride length Gait velocity: decreased   General Gait Details: slightly unsteady cadence without loss of balance using RW, unsteady with c/o LLE pain without AD, required use of RW for safety, limited for gait secondary to c/o fatigue  Stairs            Wheelchair Mobility    Modified Rankin (Stroke Patients Only)       Balance Overall balance assessment: Needs assistance Sitting-balance support: Feet supported;No upper extremity supported Sitting balance-Leahy Scale: Good Sitting balance - Comments: seated at bedside   Standing balance support: During functional activity;No upper extremity supported Standing balance-Leahy Scale: Poor Standing balance comment: fair using RW                             Pertinent Vitals/Pain Pain Assessment: No/denies pain    Home Living Family/patient expects to be discharged to:: Private residence Living Arrangements: Spouse/significant other Available Help at Discharge: Family;Available 24 hours/day Type of Home: House Home Access: Stairs to enter Entrance Stairs-Rails: Left Entrance Stairs-Number of Steps: 2 in front,  6 through garage (usually uses garage entrance) Home Layout: One level Home Equipment: Shower seat - built in;Cane - single point Additional Comments: 3 wheeled walker    Prior Function Level of Independence: Independent         Comments: 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   Dominant Hand: Right    Extremity/Trunk Assessment   Upper Extremity Assessment Upper Extremity Assessment: Generalized weakness    Lower Extremity Assessment Lower Extremity Assessment: Generalized weakness    Cervical / Trunk Assessment Cervical / Trunk Assessment: Normal  Communication   Communication: No difficulties  Cognition Arousal/Alertness: Awake/alert Behavior During Therapy: WFL for tasks assessed/performed Overall Cognitive Status: Within Functional Limits for tasks assessed                                        General Comments      Exercises     Assessment/Plan    PT Assessment Patient needs continued PT services  PT Problem List Decreased strength;Decreased activity tolerance;Decreased balance;Decreased mobility       PT Treatment Interventions Gait training;Stair training;Functional mobility training;Therapeutic activities;Therapeutic exercise;Patient/family education;Balance training    PT Goals (Current goals can be found in the Care Plan section)  Acute Rehab PT Goals Patient Stated Goal: return home with spouse to assist PT Goal Formulation: With patient/family Time For Goal Achievement: 02/05/19 Potential to Achieve Goals: Good    Frequency Min 3X/week   Barriers to discharge        Co-evaluation               AM-PAC PT "6 Clicks" Mobility  Outcome Measure Help needed turning from your back to your side while in a flat bed without using bedrails?: A Little Help needed moving from lying on your back to sitting on the side of a flat bed without using bedrails?: A Little Help needed moving to and from a bed to a chair (including a wheelchair)?: A Little Help needed standing up from a chair using your arms (e.g., wheelchair or bedside chair)?: A Little Help needed to walk in hospital room?: A Little Help needed climbing 3-5 steps with a railing? : A  Lot 6 Click Score: 17    End of Session   Activity Tolerance: Patient tolerated treatment well;Patient limited by fatigue Patient left: in chair;with call bell/phone within reach;with family/visitor present Nurse Communication: Mobility status PT Visit Diagnosis: Other abnormalities of gait and mobility (R26.89);Unsteadiness on feet (R26.81);Muscle weakness (generalized) (M62.81)    Time: [id number] PT Time Calculation (min) (ACUTE ONLY): 22 min   Charges:   PT Evaluation $PT Eval Moderate Complexity: 1 Mod PT Treatments $Therapeutic Activity: 8-22 mins        4:29 PM, 02/03/19 Lonell Grandchild, MPT Physical Therapist with [facility] [phone] office [phone] mobile phone

## 2019-02-03 NOTE — Progress Notes (Signed)
Patient complaining of constipation. Patient stated she does not remember her last BM. Given PRN miralax.

## 2019-02-03 NOTE — Plan of Care (Signed)
  Problem: Acute Rehab PT Goals(only PT should resolve) Goal: Pt Will Go Supine/Side To Sit Outcome: Progressing Flowsheets (Taken 02/03/2019 1630) Pt will go Supine/Side to Sit: with supervision Goal: Patient Will Transfer Sit To/From Stand Outcome: Progressing Flowsheets (Taken 02/03/2019 1630) Patient will transfer sit to/from stand:  with min guard assist  with supervision Goal: Pt Will Transfer Bed To Chair/Chair To Bed Outcome: Progressing Flowsheets (Taken 02/03/2019 1630) Pt will Transfer Bed to Chair/Chair to Bed:  with supervision  min guard assist Goal: Pt Will Ambulate Outcome: Progressing Flowsheets (Taken 02/03/2019 1630) Pt will Ambulate:  with supervision  with rolling walker  75 feet   4:31 PM, 02/03/19 Lonell Grandchild, MPT Physical Therapist with [facility] [phone] office [phone] mobile phone

## 2019-02-03 NOTE — Progress Notes (Signed)
PROGRESS NOTE    Amber Stephenson  [id number]  DOB: [birth year]  DOA: 02/01/2019 PCP: Sharion Balloon, FNP   Brief Admission Hx: 73 y/o female with afib, diastolic CHF,  Bipolar, depression, pHTN, DM, NAFLD presents with SOB and difficulty breathing diagnosed with acute diastolic CHF exacerbation.   MDM/Assessment & Plan:   1. Acute respiratory failure with hypoxia - Pt presented with hypoxia and clinical findings of CHF.  He is being treated supportively and feeling better. Obtain PT evaluation.    2. Acute diastolic CHF exacerbation - continue IV lasix for diuresis.  Pt clinically reporting improvement. Ambulate.  3. Essential hypertension -resumed home medications.  4. Type 2 DM - continue to monitor CBGs and SSI coverage.  5. Chronic atrial fibrillation - resumed home meds, rate control, follow.  6. PHTN/Asthma - stable follow clinically, resumed home bronchodilators.  7. Bipolar Depression - resumed home meds as ordered.  8. GAD - resume lorazepam 9. Opioid dependence - holding opioids in setting of confusion, per [place] controlled substance database, pt receives monthly oxycodone 5 mg #120 and lorazepam 1 mg #90 per month.  10. Migraine headache - triptan ordered as needed.  11. Chronic constipation -- opioid induced - Laxatives ordered.    DVT prophylaxis: apixaban  Code Status: Full  Family Communication:  Disposition Plan: inpatient    Consultants:    Procedures:    Antimicrobials:     Subjective: Pt reports has not had a bowel movement in nearly 1 week.      Objective: Vitals:   02/02/19 2147 02/03/19 0528 02/03/19 0649 02/03/19 0825  BP:   95/70   Pulse:   (!) 55   Resp:   20   Temp: 98.1 F (36.7 C)  98.3 F (36.8 C)   TempSrc: Oral  Oral   SpO2:   95% 92%  Weight:  92.4 kg    Height:        Intake/Output Summary (Last 24 hours) at 02/03/2019 1519 Last data filed at 02/03/2019 0900 Gross per 24 hour  Intake 960 ml  Output 1700 ml  Net -740 ml    Filed Weights   02/01/19 1334 02/02/19 0500 02/03/19 0528  Weight: 89.4 kg 93.6 kg 92.4 kg   REVIEW OF SYSTEMS  As per history otherwise all reviewed and reported negative  Exam:  General exam: awake, alert, NAD, cooperative.  Respiratory system: Clear. No increased work of breathing. Cardiovascular system: S1 & S2 heard. No JVD, murmurs, gallops, clicks or pedal edema. Gastrointestinal system: Abdomen is nondistended, soft and LLQ tenderness. Normal bowel sounds heard. Central nervous system: Alert and oriented. No focal neurological deficits. Extremities: no CCE.  Data Reviewed: Basic Metabolic Panel: Recent Labs  Lab 02/01/19 1421 02/02/19 0622 02/03/19 0802  NA 141 143 142  K 3.5 3.7 3.5  CL 106 110 104  CO2 25 28 29   GLUCOSE 101* 119* 132*  BUN 20 19 21   CREATININE 1.09* 1.03* 0.95  CALCIUM 8.9 8.5* 9.0  MG  --  1.9  --    Liver Function Tests: No results for input(s): AST, ALT, ALKPHOS, BILITOT, PROT, ALBUMIN in the last 168 hours. No results for input(s): LIPASE, AMYLASE in the last 168 hours. No results for input(s): AMMONIA in the last 168 hours. CBC: Recent Labs  Lab 02/01/19 1421  WBC 8.9  NEUTROABS 5.0  HGB 13.7  HCT 42.6  MCV 94.9  PLT 163   Cardiac Enzymes: No results for input(s): CKTOTAL, CKMB, CKMBINDEX,  TROPONINI in the last 168 hours. CBG (last 3)  Recent Labs    02/02/19 2146 02/03/19 0756 02/03/19 1115  GLUCAP 174* 135* 248*   Recent Results (from the past 240 hour(s))  SARS Coronavirus 2 [facility] order, Performed in [facility] hospital lab) Nasopharyngeal Nasopharyngeal Swab     Status: None   Collection Time: 02/01/19  1:50 PM   Specimen: Nasopharyngeal Swab  Result Value Ref Range Status   SARS Coronavirus 2 NEGATIVE NEGATIVE Final    Comment: (NOTE) If result is NEGATIVE SARS-CoV-2 target nucleic acids are NOT DETECTED. The SARS-CoV-2 RNA is generally detectable in upper and lower  respiratory specimens during the  acute phase of infection. The lowest  concentration of SARS-CoV-2 viral copies this assay can detect is 250  copies / mL. A negative result does not preclude SARS-CoV-2 infection  and should not be used as the sole basis for treatment or other  patient management decisions.  A negative result may occur with  improper specimen collection / handling, submission of specimen other  than nasopharyngeal swab, presence of viral mutation(s) within the  areas targeted by this assay, and inadequate number of viral copies  (<250 copies / mL). A negative result must be combined with clinical  observations, patient history, and epidemiological information. If result is POSITIVE SARS-CoV-2 target nucleic acids are DETECTED. The SARS-CoV-2 RNA is generally detectable in upper and lower  respiratory specimens dur ing the acute phase of infection.  Positive  results are indicative of active infection with SARS-CoV-2.  Clinical  correlation with patient history and other diagnostic information is  necessary to determine patient infection status.  Positive results do  not rule out bacterial infection or co-infection with other viruses. If result is PRESUMPTIVE POSTIVE SARS-CoV-2 nucleic acids MAY BE PRESENT.   A presumptive positive result was obtained on the submitted specimen  and confirmed on repeat testing.  While 2019 novel coronavirus  (SARS-CoV-2) nucleic acids may be present in the submitted sample  additional confirmatory testing may be necessary for epidemiological  and / or clinical management purposes  to differentiate between  SARS-CoV-2 and other Sarbecovirus currently known to infect humans.  If clinically indicated additional testing with an alternate test  methodology [phone]) is advised. The SARS-CoV-2 RNA is generally  detectable in upper and lower respiratory sp ecimens during the acute  phase of infection. The expected result is Negative. Fact Sheet for Patients:   StrictlyIdeas.no Fact Sheet for Healthcare Providers: BankingDealers.co.za This test is not yet approved or cleared by the [place] FDA and has been authorized for detection and/or diagnosis of SARS-CoV-2 by FDA under an Emergency Use Authorization (EUA).  This EUA will remain in effect (meaning this test can be used) for the duration of the COVID-19 declaration under Section 564(b)(1) of the Act, 21 U.S.C. section 360bbb-3(b)(1), unless the authorization is terminated or revoked sooner. Performed at [facility], [address] [place], [place] [address]      Studies: Dg Chest [facility] 1 View  Result Date: 02/03/2019 CLINICAL DATA:  Congestive heart failure. EXAM: PORTABLE CHEST 1 VIEW COMPARISON:  Radiograph February 01, 2019. FINDINGS: Stable cardiomegaly. No pneumothorax or pleural effusion is noted. No acute pulmonary disease is noted. Bony thorax is unremarkable. IMPRESSION: No active disease. Electronically Signed   By: Marijo Conception M.D.   On: 02/03/2019 08:26     Scheduled Meds: . acetaminophen  650 mg Oral Q6H   Or  . acetaminophen  650 mg Rectal  Q6H  . alum & mag hydroxide-simeth  30 mL Oral Once  . apixaban  5 mg Oral BID  . ARIPiprazole  30 mg Oral Daily  . atorvastatin  40 mg Oral Daily  . bisacodyl  10 mg Rectal BID  . carbamazepine  100 mg Oral Daily  . diltiazem  120 mg Oral QHS  . feeding supplement (ENSURE ENLIVE)  237 mL Oral BID BM  . furosemide  40 mg Intravenous Q12H  . insulin aspart  0-9 Units Subcutaneous TID WC  . insulin glargine  20 Units Subcutaneous BID  . metoprolol tartrate  125 mg Oral BID  . mometasone-formoterol  2 puff Inhalation BID  . polyethylene glycol  17 g Oral BID  . pregabalin  150 mg Oral BID  . senna-docusate  2 tablet Oral BID  . topiramate  50 mg Oral QHS   Continuous Infusions:  Active Problems:   Hypoxia   Acute on chronic diastolic CHF (congestive heart failure) ([place])    Time spent:   Irwin Brakeman, MD [facility] 02/03/2019, 3:19 PM    LOS: 1 day  How to contact the [facility] Attending or Consulting provider [place] or covering provider during after hours [place], for this patient?  1. Check the care team in [facility] and look for a) attending/consulting [facility] provider listed and b) the [facility] team listed 2. Log into [email or web address] and use [place] universal password to access. If you do not have the password, please contact the hospital operator. 3. Locate the [facility] provider you are looking for under [facility] and page to a number that you can be directly reached. 4. If you still have difficulty reaching the provider, please page the [facility] (Director on Call) for the Hospitalists listed on amion for assistance.

## 2019-02-04 LAB — GLUCOSE, CAPILLARY
Glucose-Capillary: 147 mg/dL — ABNORMAL HIGH (ref 70–99)
Glucose-Capillary: 247 mg/dL — ABNORMAL HIGH (ref 70–99)

## 2019-02-04 MED ORDER — METOPROLOL TARTRATE 100 MG PO TABS: 100.0000 mg | ORAL_TABLET | Freq: Two times a day (BID) | ORAL | Status: DC

## 2019-02-04 MED ORDER — POLYETHYLENE GLYCOL 3350 17 G PO PACK: 17.0000 g | PACK | Freq: Two times a day (BID) | ORAL | 0 refills | Status: AC

## 2019-02-04 MED ORDER — POTASSIUM CHLORIDE CRYS ER 20 MEQ PO TBCR: 20.0000 meq | EXTENDED_RELEASE_TABLET | Freq: Every day | ORAL | 1 refills | Status: DC

## 2019-02-04 NOTE — Discharge Summary (Signed)
Physician Discharge Summary  Amber Stephenson XNA:[id number] DOB: Oct 26, 1945 DOA: 02/01/2019  PCP: Sharion Balloon, FNP Cardiology: Dr. Bronson Ing  Admit date: 02/01/2019 Discharge date: 02/04/2019  Admitted From: Home  Disposition: Home with [facility] PT, RN, DME  Recommendations for Outpatient Follow-up:  1. Follow up with PCP in 1 weeks 2. Follow up with cardiology as scheduled 3. Please obtain BMP in 1-2 weeks to follow lytes  Home Health: PT, RN, DME  Discharge Condition: STABLE   CODE STATUS: FULL    Brief Hospitalization Summary: Please see all hospital notes, images, labs for full details of the hospitalization. Dr. Talmadge Coventry HPI: Amber Stephenson is a 73 y.o. female with medical history significant for atrial fibrillation, diastolic CHF, depression, bipolar disorder, asthma, NAFLD, pulmonary hypertension, diabetes mellitus, presented to the ED with complaints of difficulty breathing over the past 2 days.  She reports stable 3 pillow orthopnea.  Patient saw her primary care provider and O2 sats was 64% on room air.  She is not on home O2.  On 2 L nasal cannula O2 sats improved to 96%.  Patient denies cough, she denies lower extremity swelling, she denies abdominal bloating or weight gain.  She reports her stable weight is at 197 and this is unchanged, weight in ED is 196 pounds.  No chest pain, no fever no chills.  She reports compliance with her Lasix,  but she is not compliant with a low-salt diet.  Recent hospitalization 8/21 to 8/23 for acute respiratory failure with hypoxia secondary to mild acute chronic diastolic heart failure.  ED Course: O2 sats greater than 96% on 2 L, on my evaluation, with movement she desats to 80s.  D-dimer within normal limits 0.28.  BNP 260.  COVID-19 test negative.  Two-view chest x-ray shows cardiomegaly with vascular congestion, right thorax asymmetric hazy opacity- layering effusion or asymmetric edema.  IV Lasix 60 mg x 1 given in ED hospitalist to admit  for acute respiratory distress.  Brief Admission Hx: 73 y/o female with afib, diastolic CHF,  Bipolar, depression, pHTN, DM, NAFLD presents with SOB and difficulty breathing diagnosed with acute diastolic CHF exacerbation.   MDM/Assessment & Plan:   1. Acute respiratory failure with hypoxia - Pt presented with hypoxia and clinical findings of CHF.  She was treated supportively and feeling better. PT recommended HH PT and arranged at DC.    2. Acute diastolic CHF exacerbation - Treated with IV lasix for diuresis.  Pt clinically better.  She has diuresed nearly 2L. Weight back down. Pt requesting discharge home today.  She has outpatient cardiology follow up arranged.   3. Essential hypertension -resumed home medications.  4. Type 2 DM - resume home treatments.  5. Chronic atrial fibrillation - resumed home meds, rate control, follow.  6. PHTN/Asthma - stable follow clinically, resumed home bronchodilators.  7. Bipolar Depression - resumed home meds as ordered.  8. GAD - resume lorazepam 9. Opioid dependence - holding opioids in setting of confusion, per [place] controlled substance database, pt receives monthly oxycodone 5 mg #120 and lorazepam 1 mg #90 per month.  10. Migraine headache - triptan ordered as needed.  11. Chronic constipation -- opioid induced - Laxatives ordered.    DVT prophylaxis: apixaban  Code Status: Full  Family Communication:  Disposition Plan: inpatient    Consultants:    Procedures:    Antimicrobials:     Discharge Diagnoses:  Active Problems:   Hypoxia   Acute on chronic diastolic CHF (congestive heart  failure) [facility])   Discharge Instructions: Discharge Instructions    (HEART FAILURE PATIENTS) Call MD:  Anytime you have any of the following symptoms: 1) 3 pound weight gain in 24 hours or 5 pounds in 1 week 2) shortness of breath, with or without a dry hacking cough 3) swelling in the hands, feet or stomach 4) if you have to sleep on extra  pillows at night in order to breathe.   Complete by: As directed    Diet - low sodium heart healthy   Complete by: As directed    Increase activity slowly   Complete by: As directed      Allergies as of 02/04/2019      Reactions   Ivp Dye [iodinated Diagnostic Agents] Anaphylaxis, Swelling   Throat closes   Latex    Latex tape pulls skin with it      Medication List    TAKE these medications   acetaminophen 325 MG tablet Commonly known as: TYLENOL Take 325-650 mg by mouth every 6 (six) hours as needed for mild pain, moderate pain or headache.   apixaban 5 MG Tabs tablet Commonly known as: Eliquis Take 1 tablet (5 mg total) by mouth 2 (two) times daily.   ARIPiprazole 10 MG tablet Commonly known as: ABILIFY Take 3 tablets (30 mg total) by mouth daily.   atorvastatin 40 MG tablet Commonly known as: LIPITOR TAKE 1 TABLET DAILY   budesonide-formoterol 80-4.5 MCG/ACT inhaler Commonly known as: Symbicort Inhale 2 puffs into the lungs 2 (two) times daily. What changed:   when to take this  reasons to take this   carbamazepine 100 MG 12 hr capsule Commonly known as: CARBATROL Take 1 capsule (100 mg total) by mouth daily.   diltiazem 120 MG 24 hr capsule Commonly known as: CARDIZEM CD TAKE 1 CAPSULE AT BEDTIME   Dulaglutide 1.5 MG/0.5ML Sopn Commonly known as: Trulicity Inject 1.5 mg into the skin once a week.   Eszopiclone 3 MG Tabs Commonly known as: eszopiclone Take 1 tablet (3 mg total) by mouth at bedtime as needed. Take immediately before bedtime What changed: reasons to take this   furosemide 20 MG tablet Commonly known as: LASIX TAKE 1 TABLET TWICE A DAY What changed: when to take this   Insulin Glargine 100 UNIT/ML Solostar Pen Commonly known as: Lantus SoloStar INJECT 60 UNITS UNDER THE SKIN TWICE A DAY What changed:   how much to take  how to take this  when to take this  additional instructions   LORazepam 1 MG tablet Commonly known as:  Ativan Take 1 tab (1 mg) in the morning and 2 tabs (2 mg ) at bedtime What changed:   how much to take  how to take this  when to take this   metoprolol tartrate 25 MG tablet Commonly known as: LOPRESSOR Take 1 tablet (25 mg total) by mouth 2 (two) times daily. Take with metoprolol 100 mg to equal 125 mg What changed: additional instructions   metoprolol tartrate 100 MG tablet Commonly known as: LOPRESSOR Take 1 tablet (100 mg total) by mouth 2 (two) times daily. Take 154m + 278mfor a total of 12549mwice a day. What changed: See the new instructions.   Narcan 4 MG/0.1ML Liqd nasal spray kit Generic drug: naloxone Place 1 spray into the nose once as needed (opiod overdose).   nystatin powder Commonly known as: MYCOSTATIN/NYSTOP Apply topically 3 (three) times daily. x2 weeks (for groin/breast rash) What changed:  how much to take  when to take this  reasons to take this  additional instructions   polyethylene glycol 17 g packet Commonly known as: MIRALAX / GLYCOLAX Take 17 g by mouth 2 (two) times daily.   polyvinyl alcohol 1.4 % ophthalmic solution Commonly known as: LIQUIFILM TEARS Place 1 drop into both eyes as needed for dry eyes.   potassium chloride SA 20 MEQ tablet Commonly known as: K-DUR Take 1 tablet (20 mEq total) by mouth daily.   pregabalin 150 MG capsule Commonly known as: Lyrica Take 1 capsule (150 mg total) by mouth 2 (two) times daily.   rizatriptan 10 MG disintegrating tablet Commonly known as: MAXALT-MLT DISSOLVE 1 TABLET ON THE TONGUE AT FIRST SIGN OF HEADACHE. MAY REPEAT ONE DOSE IN 1 HOUR IF NEEDED What changed:   how much to take  how to take this  when to take this   topiramate 25 MG tablet Commonly known as: TOPAMAX Take 2 tablets every night What changed:   how much to take  how to take this  when to take this   traZODone 100 MG tablet Commonly known as: DESYREL 4  qhs What changed:   how much to take  how  to take this  when to take this  additional instructions   Vascepa 1 g Caps Generic drug: Icosapent Ethyl Take 2 capsules (2 g total) by mouth 2 (two) times daily with a meal.   Vitamin D3 25 MCG (1000 UT) Caps Take 1,000 Units by mouth daily.      Follow-up Information    Sharion Balloon, FNP. Schedule an appointment as soon as possible for a visit in 1 week.   Specialty: Family Medicine Contact information: [place] [address] [phone]        Herminio Commons, MD. Schedule an appointment as soon as possible for a visit on 02/08/2019.   Specialty: Cardiology Why: 4:20pm Contact information: [place] [address] [phone]          Allergies  Allergen Reactions  . Ivp Dye [Iodinated Diagnostic Agents] Anaphylaxis and Swelling    Throat closes  . Latex     Latex tape pulls skin with it   Allergies as of 02/04/2019      Reactions   Ivp Dye [iodinated Diagnostic Agents] Anaphylaxis, Swelling   Throat closes   Latex    Latex tape pulls skin with it      Medication List    TAKE these medications   acetaminophen 325 MG tablet Commonly known as: TYLENOL Take 325-650 mg by mouth every 6 (six) hours as needed for mild pain, moderate pain or headache.   apixaban 5 MG Tabs tablet Commonly known as: Eliquis Take 1 tablet (5 mg total) by mouth 2 (two) times daily.   ARIPiprazole 10 MG tablet Commonly known as: ABILIFY Take 3 tablets (30 mg total) by mouth daily.   atorvastatin 40 MG tablet Commonly known as: LIPITOR TAKE 1 TABLET DAILY   budesonide-formoterol 80-4.5 MCG/ACT inhaler Commonly known as: Symbicort Inhale 2 puffs into the lungs 2 (two) times daily. What changed:   when to take this  reasons to take this   carbamazepine 100 MG 12 hr capsule Commonly known as: CARBATROL Take 1 capsule (100 mg total) by mouth daily.   diltiazem 120 MG 24 hr capsule Commonly known as: CARDIZEM CD TAKE 1 CAPSULE  AT BEDTIME   Dulaglutide 1.5 MG/0.5ML Sopn Commonly known  as: Trulicity Inject 1.5 mg into the skin once a week.   Eszopiclone 3 MG Tabs Commonly known as: eszopiclone Take 1 tablet (3 mg total) by mouth at bedtime as needed. Take immediately before bedtime What changed: reasons to take this   furosemide 20 MG tablet Commonly known as: LASIX TAKE 1 TABLET TWICE A DAY What changed: when to take this   Insulin Glargine 100 UNIT/ML Solostar Pen Commonly known as: Lantus SoloStar INJECT 60 UNITS UNDER THE SKIN TWICE A DAY What changed:   how much to take  how to take this  when to take this  additional instructions   LORazepam 1 MG tablet Commonly known as: Ativan Take 1 tab (1 mg) in the morning and 2 tabs (2 mg ) at bedtime What changed:   how much to take  how to take this  when to take this   metoprolol tartrate 25 MG tablet Commonly known as: LOPRESSOR Take 1 tablet (25 mg total) by mouth 2 (two) times daily. Take with metoprolol 100 mg to equal 125 mg What changed: additional instructions   metoprolol tartrate 100 MG tablet Commonly known as: LOPRESSOR Take 1 tablet (100 mg total) by mouth 2 (two) times daily. Take 121m + 216mfor a total of 12554mwice a day. What changed: See the new instructions.   Narcan 4 MG/0.1ML Liqd nasal spray kit Generic drug: naloxone Place 1 spray into the nose once as needed (opiod overdose).   nystatin powder Commonly known as: MYCOSTATIN/NYSTOP Apply topically 3 (three) times daily. x2 weeks (for groin/breast rash) What changed:   how much to take  when to take this  reasons to take this  additional instructions   polyethylene glycol 17 g packet Commonly known as: MIRALAX / GLYCOLAX Take 17 g by mouth 2 (two) times daily.   polyvinyl alcohol 1.4 % ophthalmic solution Commonly known as: LIQUIFILM TEARS Place 1 drop into both eyes as needed for dry eyes.   potassium chloride SA 20 MEQ tablet Commonly known  as: K-DUR Take 1 tablet (20 mEq total) by mouth daily.   pregabalin 150 MG capsule Commonly known as: Lyrica Take 1 capsule (150 mg total) by mouth 2 (two) times daily.   rizatriptan 10 MG disintegrating tablet Commonly known as: MAXALT-MLT DISSOLVE 1 TABLET ON THE TONGUE AT FIRST SIGN OF HEADACHE. MAY REPEAT ONE DOSE IN 1 HOUR IF NEEDED What changed:   how much to take  how to take this  when to take this   topiramate 25 MG tablet Commonly known as: TOPAMAX Take 2 tablets every night What changed:   how much to take  how to take this  when to take this   traZODone 100 MG tablet Commonly known as: DESYREL 4  qhs What changed:   how much to take  how to take this  when to take this  additional instructions   Vascepa 1 g Caps Generic drug: Icosapent Ethyl Take 2 capsules (2 g total) by mouth 2 (two) times daily with a meal.   Vitamin D3 25 MCG (1000 UT) Caps Take 1,000 Units by mouth daily.       Procedures/Studies: Dg Chest Port 1 View  Result Date: 02/03/2019 CLINICAL DATA:  Congestive heart failure. EXAM: PORTABLE CHEST 1 VIEW COMPARISON:  Radiograph February 01, 2019. FINDINGS: Stable cardiomegaly. No pneumothorax or pleural effusion is noted. No acute pulmonary disease is noted. Bony thorax is unremarkable. IMPRESSION: No active disease. Electronically Signed   By:  Marijo Conception M.D.   On: 02/03/2019 08:26   Dg Chest Port 1 View  Result Date: 02/01/2019 CLINICAL DATA:  Short of breath EXAM: PORTABLE CHEST 1 VIEW COMPARISON:  01/23/2019, 03/06/2018 FINDINGS: Cardiomegaly with vascular congestion. Asymmetric hazy opacity right thorax, likely rotation and overlying soft tissues. Convex opacity left lower mediastinal silhouette presumably slight unfolding of aorta IMPRESSION: 1. Cardiomegaly with vascular congestion 2. Asymmetric hazy opacity of right thorax, likely due to patient rotation and overlying soft tissue. Alternatively, findings could be secondary  to a layering effusion and/or asymmetric edema. Electronically Signed   By: Donavan Foil M.D.   On: 02/01/2019 14:41   Dg Chest Port 1 View  Result Date: 01/23/2019 CLINICAL DATA:  Patient with shortness of breath. EXAM: PORTABLE CHEST 1 VIEW COMPARISON:  Chest radiograph 01/21/2019 FINDINGS: Monitoring leads overlie the patient. Stable cardiomegaly. Low lung volumes. Bibasilar atelectasis. Improved aeration of the left lower lung. No pleural effusion or pneumothorax. IMPRESSION: Improved aeration of the left lower lung. Minimal bibasilar atelectasis. Cardiomegaly. Electronically Signed   By: Lovey Newcomer M.D.   On: 01/23/2019 11:59   Dg Chest Port 1 View  Result Date: 01/21/2019 CLINICAL DATA:  Shortness of breath. EXAM: PORTABLE CHEST 1 VIEW COMPARISON:  Radiograph of March 06, 2018. FINDINGS: Stable cardiomegaly. No pneumothorax is noted. Right lung is clear. Mild left basilar opacity is noted concerning for atelectasis or infiltrate with associated pleural effusion. Bony thorax is unremarkable. IMPRESSION: Mild left basilar atelectasis or infiltrate is noted with associated pleural effusion. Electronically Signed   By: Marijo Conception M.D.   On: 01/21/2019 10:26    Subjective: Pt says she feeling better. She is breathing better. She would like to go home.   Discharge Exam: Vitals:   02/04/19 0552 02/04/19 0930  BP: 116/75 118/65  Pulse: (!) 59 92  Resp:    Temp:    SpO2:     Vitals:   02/04/19 0457 02/04/19 0504 02/04/19 0552 02/04/19 0930  BP:  (!) 118/44 116/75 118/65  Pulse:  66 (!) 59 92  Resp:  17    Temp:  97.9 F (36.6 C)    TempSrc:  Oral    SpO2:  93%    Weight: 90 kg     Height:       General exam: awake, alert, NAD, cooperative.  Respiratory system: Clear. No increased work of breathing. Cardiovascular system: S1 & S2 heard. No JVD, murmurs, gallops, clicks or pedal edema. Gastrointestinal system: Abdomen is nondistended, soft and LLQ tenderness. Normal bowel  sounds heard. Central nervous system: Alert and oriented. No focal neurological deficits. Extremities: no CCE.   The results of significant diagnostics from this hospitalization (including imaging, microbiology, ancillary and laboratory) are listed below for reference.     Microbiology: Recent Results (from the past 240 hour(s))  SARS Coronavirus 2 [facility] order, Performed in [facility] hospital lab) Nasopharyngeal Nasopharyngeal Swab     Status: None   Collection Time: 02/01/19  1:50 PM   Specimen: Nasopharyngeal Swab  Result Value Ref Range Status   SARS Coronavirus 2 NEGATIVE NEGATIVE Final    Comment: (NOTE) If result is NEGATIVE SARS-CoV-2 target nucleic acids are NOT DETECTED. The SARS-CoV-2 RNA is generally detectable in upper and lower  respiratory specimens during the acute phase of infection. The lowest  concentration of SARS-CoV-2 viral copies this assay can detect is 250  copies / mL. A negative result does not preclude SARS-CoV-2 infection  and should  not be used as the sole basis for treatment or other  patient management decisions.  A negative result may occur with  improper specimen collection / handling, submission of specimen other  than nasopharyngeal swab, presence of viral mutation(s) within the  areas targeted by this assay, and inadequate number of viral copies  (<250 copies / mL). A negative result must be combined with clinical  observations, patient history, and epidemiological information. If result is POSITIVE SARS-CoV-2 target nucleic acids are DETECTED. The SARS-CoV-2 RNA is generally detectable in upper and lower  respiratory specimens dur ing the acute phase of infection.  Positive  results are indicative of active infection with SARS-CoV-2.  Clinical  correlation with patient history and other diagnostic information is  necessary to determine patient infection status.  Positive results do  not rule out bacterial infection or co-infection with  other viruses. If result is PRESUMPTIVE POSTIVE SARS-CoV-2 nucleic acids MAY BE PRESENT.   A presumptive positive result was obtained on the submitted specimen  and confirmed on repeat testing.  While 2019 novel coronavirus  (SARS-CoV-2) nucleic acids may be present in the submitted sample  additional confirmatory testing may be necessary for epidemiological  and / or clinical management purposes  to differentiate between  SARS-CoV-2 and other Sarbecovirus currently known to infect humans.  If clinically indicated additional testing with an alternate test  methodology [phone]) is advised. The SARS-CoV-2 RNA is generally  detectable in upper and lower respiratory sp ecimens during the acute  phase of infection. The expected result is Negative. Fact Sheet for Patients:  StrictlyIdeas.no Fact Sheet for Healthcare Providers: BankingDealers.co.za This test is not yet approved or cleared by the [place] FDA and has been authorized for detection and/or diagnosis of SARS-CoV-2 by FDA under an Emergency Use Authorization (EUA).  This EUA will remain in effect (meaning this test can be used) for the duration of the COVID-19 declaration under Section 564(b)(1) of the Act, 21 U.S.C. section 360bbb-3(b)(1), unless the authorization is terminated or revoked sooner. Performed at [facility], [address] [place], [place] [address]      Labs: BNP (last 3 results) Recent Labs    01/21/19 1156 02/01/19 1349  BNP 202.6* 801.6*   Basic Metabolic Panel: Recent Labs  Lab 02/01/19 1421 02/02/19 0622 02/03/19 0802  NA 141 143 142  K 3.5 3.7 3.5  CL 106 110 104  CO2 _0 GLUCOSE 101* 119* 132*  BUN _1 CREATININE 1.09* 1.03* 0.95  CALCIUM 8.9 8.5* 9.0  MG  --  1.9  --    Liver Function Tests: No results for input(s): AST, ALT, ALKPHOS, BILITOT, PROT, ALBUMIN in the last 168 hours. No results for input(s): LIPASE, AMYLASE  in the last 168 hours. No results for input(s): AMMONIA in the last 168 hours. CBC: Recent Labs  Lab 02/01/19 1421  WBC 8.9  NEUTROABS 5.0  HGB 13.7  HCT 42.6  MCV 94.9  PLT 163   Cardiac Enzymes: No results for input(s): CKTOTAL, CKMB, CKMBINDEX, TROPONINI in the last 168 hours. BNP: Invalid input(s): POCBNP CBG: Recent Labs  Lab 02/03/19 0756 02/03/19 1115 02/03/19 1602 02/03/19 2031 02/04/19 0744  GLUCAP 135* 248* 157* 229* 147*   D-Dimer Recent Labs    02/01/19 1421  DDIMER 0.28   Hgb A1c No results for input(s): HGBA1C in the last 72 hours. Lipid Profile No results for input(s): CHOL, HDL, LDLCALC, TRIG, CHOLHDL, LDLDIRECT in the last 72 hours. Thyroid  function studies No results for input(s): TSH, T4TOTAL, T3FREE, THYROIDAB in the last 72 hours.  Invalid input(s): FREET3 Anemia work up No results for input(s): VITAMINB12, FOLATE, FERRITIN, TIBC, IRON, RETICCTPCT in the last 72 hours. Urinalysis    Component Value Date/Time   COLORURINE STRAW (A) 01/21/2019 1513   APPEARANCEUR CLEAR 01/21/2019 1513   APPEARANCEUR Clear 10/01/2017 1424   LABSPEC 1.009 01/21/2019 1513   PHURINE 7.0 01/21/2019 1513   GLUCOSEU NEGATIVE 01/21/2019 1513   HGBUR NEGATIVE 01/21/2019 1513   BILIRUBINUR NEGATIVE 01/21/2019 1513   BILIRUBINUR Negative 10/01/2017 1424   KETONESUR NEGATIVE 01/21/2019 1513   PROTEINUR NEGATIVE 01/21/2019 1513   UROBILINOGEN 0.2 07/23/2015 1350   NITRITE NEGATIVE 01/21/2019 1513   LEUKOCYTESUR NEGATIVE 01/21/2019 1513   Sepsis Labs Invalid input(s): PROCALCITONIN,  WBC,  LACTICIDVEN Microbiology Recent Results (from the past 240 hour(s))  SARS Coronavirus 2 [facility] order, Performed in [facility] hospital lab) Nasopharyngeal Nasopharyngeal Swab     Status: None   Collection Time: 02/01/19  1:50 PM   Specimen: Nasopharyngeal Swab  Result Value Ref Range Status   SARS Coronavirus 2 NEGATIVE NEGATIVE Final    Comment: (NOTE) If result is  NEGATIVE SARS-CoV-2 target nucleic acids are NOT DETECTED. The SARS-CoV-2 RNA is generally detectable in upper and lower  respiratory specimens during the acute phase of infection. The lowest  concentration of SARS-CoV-2 viral copies this assay can detect is 250  copies / mL. A negative result does not preclude SARS-CoV-2 infection  and should not be used as the sole basis for treatment or other  patient management decisions.  A negative result may occur with  improper specimen collection / handling, submission of specimen other  than nasopharyngeal swab, presence of viral mutation(s) within the  areas targeted by this assay, and inadequate number of viral copies  (<250 copies / mL). A negative result must be combined with clinical  observations, patient history, and epidemiological information. If result is POSITIVE SARS-CoV-2 target nucleic acids are DETECTED. The SARS-CoV-2 RNA is generally detectable in upper and lower  respiratory specimens dur ing the acute phase of infection.  Positive  results are indicative of active infection with SARS-CoV-2.  Clinical  correlation with patient history and other diagnostic information is  necessary to determine patient infection status.  Positive results do  not rule out bacterial infection or co-infection with other viruses. If result is PRESUMPTIVE POSTIVE SARS-CoV-2 nucleic acids MAY BE PRESENT.   A presumptive positive result was obtained on the submitted specimen  and confirmed on repeat testing.  While 2019 novel coronavirus  (SARS-CoV-2) nucleic acids may be present in the submitted sample  additional confirmatory testing may be necessary for epidemiological  and / or clinical management purposes  to differentiate between  SARS-CoV-2 and other Sarbecovirus currently known to infect humans.  If clinically indicated additional testing with an alternate test  methodology [phone]) is advised. The SARS-CoV-2 RNA is generally  detectable  in upper and lower respiratory sp ecimens during the acute  phase of infection. The expected result is Negative. Fact Sheet for Patients:  StrictlyIdeas.no Fact Sheet for Healthcare Providers: BankingDealers.co.za This test is not yet approved or cleared by the [place] FDA and has been authorized for detection and/or diagnosis of SARS-CoV-2 by FDA under an Emergency Use Authorization (EUA).  This EUA will remain in effect (meaning this test can be used) for the duration of the COVID-19 declaration under Section 564(b)(1) of the Act, 21 U.S.C. section 360bbb-3(b)(1),  unless the authorization is terminated or revoked sooner. Performed at [facility], [address], [place], [place] [address]    Time coordinating discharge: 31 minutes  SIGNED:  Irwin Brakeman, MD  [facility] 02/04/2019, 10:45 AM How to contact the [facility] Attending or Consulting provider [place] or covering provider during after hours [place], for this patient?  1. Check the care team in [facility] and look for a) attending/consulting [facility] provider listed and b) the [facility] [facility] team listed 2. Log into [email or web address] and use [place]'s universal password to access. If you do not have the password, please contact the hospital operator. 3. Locate the [facility] provider you are looking for under [facility] and page to a number that you can be directly reached. 4. If you still have difficulty reaching the provider, please page the [facility] (Director on Call) for the Hospitalists listed on amion for assistance.

## 2019-02-04 NOTE — Care Management Important Message (Signed)
Important Message  Patient Details  Name: Amber Stephenson MRN: [id number] Date of Birth: Sep 11, 1945   Medicare Important Message Given:  Yes     Tommy Medal 02/04/2019, 12:27 PM

## 2019-02-04 NOTE — Clinical Social Work Note (Signed)
Pt recommended for [facility] PT.  DME equipment includes walker, cane, shower chair.  Lives at home with husband of 27 years.  Has used [facility] in the past, would like to use them again.  Called [facility] and confirmed they could pick her up immediately.  He states they will likely see her this weekend.  TOC sign off.

## 2019-02-04 NOTE — Discharge Instructions (Signed)
Heart Failure, Diagnosis  Heart failure is a condition in which the heart has trouble pumping blood because it has become weak or stiff. This means that the heart does not pump blood well enough for the body to stay healthy. For some people with heart failure, fluid may back up into the lungs. There may also be swelling (edema) in the lower legs. Heart failure is usually a long-term (chronic) condition. It is important for you to take good care of yourself and follow the treatment plan from your health care provider. What are the causes? This condition may be caused by:  High blood pressure (hypertension). Hypertension causes the heart muscle to work harder than normal. This makes the heart stiff or weak.  Coronary artery disease, or CAD. CAD is the buildup of cholesterol and fat (plaque) in the arteries of the heart.  Heart attack, also called myocardial infarction. This injures the heart muscle, making it hard for the heart to pump blood.  Abnormal heart valves. The valves do not open and close properly, forcing the heart to pump harder to keep the blood flowing.  Heart muscle disease (cardiomyopathy or myocarditis). This is damage to the heart muscle. It can increase the risk of heart failure.  Lung disease. The heart works harder when the lungs are not healthy.  Abnormal heart rhythms. These can lead to heart failure. What increases the risk? The risk of heart failure increases as a person ages. This condition is also more likely to develop in people who:  Are overweight.  Are female.  Smoke or chew tobacco.  Abuse alcohol or illegal drugs.  Have taken medicines that can damage the heart, such as chemotherapy drugs.  Have diabetes.  Have abnormal heart rhythms.  Have thyroid problems.  Have low blood counts (anemia). What are the signs or symptoms? Symptoms of this condition include:  Shortness of breath with activity, such as when climbing stairs.  A cough that does not  go away.  Swelling of the feet, ankles, legs, or abdomen.  Losing weight for no reason.  Trouble breathing when lying flat (orthopnea).  Waking from sleep because of the need to sit up and get more air.  Rapid heartbeat.  Tiredness (fatigue) and loss of energy.  Feeling light-headed, dizzy, or close to fainting.  Loss of appetite.  Nausea.  Waking up more often during the night to urinate (nocturia).  Confusion. How is this diagnosed? This condition is diagnosed based on:  Your medical history, symptoms, and a physical exam.  Diagnostic tests, which may include: ? Echocardiogram. ? Electrocardiogram (ECG). ? Chest X-ray. ? Blood tests. ? Exercise stress test. ? Radionuclide scans. ? Cardiac catheterization and angiogram. How is this treated? Treatment for this condition is aimed at managing the symptoms of heart failure. Medicines Treatment may include medicines that:  Help lower blood pressure by relaxing (dilating) the blood vessels. These medicines are called ACE inhibitors (angiotensin-converting enzyme) and ARBs (angiotensin receptor blockers).  Cause the kidneys to remove salt and water from the blood through urination (diuretics).  Improve heart muscle strength and prevent the heart from beating too fast (beta blockers).  Increase the force of the heartbeat (digoxin). Healthy behavior changes     Treatment may also include making healthy lifestyle changes, such as:  Reaching and staying at a healthy weight.  Quitting smoking or chewing tobacco.  Eating heart-healthy foods.  Limiting or avoiding alcohol.  Stopping the use of illegal drugs.  Being physically active.  Other treatments  Other treatments may include:  Procedures to open blocked arteries or repair damaged valves.  Placing a pacemaker to improve heart function (cardiac resynchronization therapy).  Placing a device to treat serious abnormal heart rhythms (implantable cardioverter  defibrillator, or ICD).  Placing a device to improve the pumping ability of the heart (left ventricular assist device, or LVAD).  Receiving a healthy heart from a donor (heart transplant). This is done when other treatments have not helped. Follow these instructions at home:  Manage other health conditions as told by your health care provider. These may include hypertension, diabetes, thyroid disease, or abnormal heart rhythms.  Get ongoing education and support as needed. Learn as much as you can about heart failure.  Keep all follow-up visits as told by your health care provider. This is important. Summary  Heart failure is a condition in which the heart has trouble pumping blood because it has become weak or stiff.  This condition is caused by high blood pressure and other diseases of the heart and lungs.  Symptoms of this condition include shortness of breath, tiredness (fatigue), nausea, and swelling of the feet, ankles, legs, or abdomen.  Treatments for this condition may include medicines, lifestyle changes, and surgery.  Manage other health conditions as told by your health care provider. This information is not intended to replace advice given to you by your health care provider. Make sure you discuss any questions you have with your health care provider. Document Released: 05/19/2005 Document Revised: 08/06/2018 Document Reviewed: 08/06/2018 Elsevier Patient Education  [place].   Heart Failure, Self Care Heart failure is a serious condition. This sheet explains things you need to do to take care of yourself at home. To help you stay as healthy as possible, you may be asked to change your diet, take certain medicines, and make other changes in your life. Your doctor may also give you more specific instructions. If you have problems or questions, call your doctor. What are the risks? Having heart failure makes it more likely for you to have some problems. These  problems can get worse if you do not take good care of yourself. Problems may include:  Blood clotting problems. This may cause a stroke.  Damage to the kidneys, liver, or lungs.  Abnormal heart rhythms. Supplies needed:  Scale for weighing yourself.  Blood pressure monitor.  Notebook.  Medicines. How to care for yourself when you have heart failure Medicines Take over-the-counter and prescription medicines only as told by your doctor. Take your medicines every day.  Do not stop taking your medicine unless your doctor tells you to do so.  Do not skip any medicines.  Get your prescriptions refilled before you run out of medicine. This is important. Eating and drinking   Eat heart-healthy foods. Talk with a diet specialist (dietitian) to create an eating plan.  Choose foods that: ? Have no trans fat. ? Are low in saturated fat and cholesterol.  Choose healthy foods, such as: ? Fresh or frozen fruits and vegetables. ? Fish. ? Low-fat (lean) meats. ? Legumes, such as beans, peas, and lentils. ? Fat-free or low-fat dairy products. ? Whole-grain foods. ? High-fiber foods.  Limit salt (sodium) if told by your doctor. Ask your diet specialist to tell you which seasonings are healthy for your heart.  Cook in healthy ways instead of frying. Healthy ways of cooking include roasting, grilling, broiling, baking, poaching, steaming, and stir-frying.  Limit how much fluid you drink, if told by  your doctor. Alcohol use  Do not drink alcohol if: ? Your doctor tells you not to drink. ? Your heart was damaged by alcohol, or you have very bad heart failure. ? You are pregnant, may be pregnant, or are planning to become pregnant.  If you drink alcohol: ? Limit how much you use to:  0-1 drink a day for women.  0-2 drinks a day for men. ? Be aware of how much alcohol is in your drink. In the [place] one drink equals one 12 oz bottle of beer (355 mL), one 5 oz glass of wine (148  mL), or one 1 oz glass of hard liquor (44 mL). Lifestyle   Do not use any products that contain nicotine or tobacco, such as cigarettes, e-cigarettes, and chewing tobacco. If you need help quitting, ask your doctor. ? Do not use nicotine gum or patches before talking to your doctor.  Do not use illegal drugs.  Lose weight if told by your doctor.  Do physical activity if told by your doctor. Talk to your doctor before you begin an exercise if: ? You are an older adult. ? You have very bad heart failure.  Learn to manage stress. If you need help, ask your doctor.  Get rehab (rehabilitation) to help you stay independent and to help with your quality of life.  Plan time to rest when you get tired. Check weight and blood pressure   Weigh yourself every day. This will help you to know if fluid is building up in your body. ? Weigh yourself every morning after you pee (urinate) and before you eat breakfast. ? Wear the same amount of clothing each time. ? Write down your daily weight. Give your record to your doctor.  Check and write down your blood pressure as told by your doctor.  Check your pulse as told by your doctor. Dealing with very hot and very cold weather  If it is very hot: ? Avoid activities that take a lot of energy. ? Use air conditioning or fans, or find a cooler place. ? Avoid caffeine and alcohol. ? Wear clothing that is loose-fitting, lightweight, and light-colored.  If it is very cold: ? Avoid activities that take a lot of energy. ? Layer your clothes. ? Wear mittens or gloves, a hat, and a scarf when you go outside. ? Avoid alcohol. Follow these instructions at home:  Stay up to date with shots (vaccines). Get pneumococcal and flu (influenza) shots.  Keep all follow-up visits as told by your doctor. This is important. Contact a doctor if:  You gain weight quickly.  You have increasing shortness of breath.  You cannot do your normal  activities.  You get tired easily.  You cough a lot.  You don't feel like eating or feel like you may vomit (nauseous).  You become puffy (swell) in your hands, feet, ankles, or belly (abdomen).  You cannot sleep well because it is hard to breathe.  You feel like your heart is beating fast (palpitations).  You get dizzy when you stand up. Get help right away if:  You have trouble breathing.  You or someone else notices a change in your behavior, such as having trouble staying awake.  You have chest pain or discomfort.  You pass out (faint). These symptoms may be an emergency. Do not wait to see if the symptoms will go away. Get medical help right away. Call your local emergency services (911 in the [place] Do not drive  yourself to the hospital. Summary  Heart failure is a serious condition. To care for yourself, you may have to change your diet, take medicines, and make other lifestyle changes.  Take your medicines every day. Do not stop taking them unless your doctor tells you to do so.  Eat heart-healthy foods, such as fresh or frozen fruits and vegetables, fish, lean meats, legumes, fat-free or low-fat dairy products, and whole-grain or high-fiber foods.  Ask your doctor if you can drink alcohol. You may have to stop alcohol use if you have very bad heart failure.  Contact your doctor if you gain weight quickly or feel that your heart is beating too fast. Get help right away if you pass out, or have chest pain or trouble breathing. This information is not intended to replace advice given to you by your health care provider. Make sure you discuss any questions you have with your health care provider. Document Released: 09/01/2018 Document Revised: 08/31/2018 Document Reviewed: 09/01/2018 Elsevier Patient Education  [place].  Constipation, Adult Constipation is when a person has fewer bowel movements in a week than normal, has difficulty having a bowel movement, or  has stools that are dry, hard, or larger than normal. Constipation may be caused by an underlying condition. It may become worse with age if a person takes certain medicines and does not take in enough fluids. Follow these instructions at home: Eating and drinking   Eat foods that have a lot of fiber, such as fresh fruits and vegetables, whole grains, and beans.  Limit foods that are high in fat, low in fiber, or overly processed, such as french fries, hamburgers, cookies, candies, and soda.  Drink enough fluid to keep your urine clear or pale yellow. General instructions  Exercise regularly or as told by your health care provider.  Go to the restroom when you have the urge to go. Do not hold it in.  Take over-the-counter and prescription medicines only as told by your health care provider. These include any fiber supplements.  Practice pelvic floor retraining exercises, such as deep breathing while relaxing the lower abdomen and pelvic floor relaxation during bowel movements.  Watch your condition for any changes.  Keep all follow-up visits as told by your health care provider. This is important. Contact a health care provider if:  You have pain that gets worse.  You have a fever.  You do not have a bowel movement after 4 days.  You vomit.  You are not hungry.  You lose weight.  You are bleeding from the anus.  You have thin, pencil-like stools. Get help right away if:  You have a fever and your symptoms suddenly get worse.  You leak stool or have blood in your stool.  Your abdomen is bloated.  You have severe pain in your abdomen.  You feel dizzy or you faint. This information is not intended to replace advice given to you by your health care provider. Make sure you discuss any questions you have with your health care provider. Document Released: 02/15/2004 Document Revised: 05/01/2017 Document Reviewed: 11/07/2015 Elsevier Patient Education  2020 [place].     IMPORTANT INFORMATION: PAY CLOSE ATTENTION   PHYSICIAN DISCHARGE INSTRUCTIONS  Follow with Primary care provider  Sharion Balloon, FNP  and other consultants as instructed by your Hospitalist Physician  Amber Stephenson IF SYMPTOMS COME BACK, WORSEN OR NEW PROBLEM DEVELOPS   Please note: You were cared  for by a hospitalist during your hospital stay. Every effort will be made to forward records to your primary care provider.  You can request that your primary care provider send for your hospital records if they have not received them.  Once you are discharged, your primary care physician will handle any further medical issues. Please note that NO REFILLS for any discharge medications will be authorized once you are discharged, as it is imperative that you return to your primary care physician (or establish a relationship with a primary care physician if you do not have one) for your post hospital discharge needs so that they can reassess your need for medications and monitor your lab values.  Please get a complete blood count and chemistry panel checked by your Primary MD at your next visit, and again as instructed by your Primary MD.  Get Medicines reviewed and adjusted: Please take all your medications with you for your next visit with your Primary MD  Laboratory/radiological data: Please request your Primary MD to go over all hospital tests and procedure/radiological results at the follow up, please ask your primary care provider to get all Hospital records sent to his/her office.  In some cases, they will be blood work, cultures and biopsy results pending at the time of your discharge. Please request that your primary care provider follow up on these results.  If you are diabetic, please bring your blood sugar readings with you to your follow up appointment with primary care.    Please call and make your follow up appointments as soon as possible.     Also Note the following: If you experience worsening of your admission symptoms, develop shortness of breath, life threatening emergency, suicidal or homicidal thoughts you must seek medical attention immediately by calling 911 or calling your MD immediately  if symptoms less severe.  You must read complete instructions/literature along with all the possible adverse reactions/side effects for all the Medicines you take and that have been prescribed to you. Take any new Medicines after you have completely understood and accpet all the possible adverse reactions/side effects.   Do not drive when taking Pain medications or sleeping medications (Benzodiazepines)  Do not take more than prescribed Pain, Sleep and Anxiety Medications. It is not advisable to combine anxiety,sleep and pain medications without talking with your primary care practitioner  Special Instructions: If you have smoked or chewed Tobacco  in the last 2 yrs please stop smoking, stop any regular Alcohol  and or any Recreational drug use.  Wear Seat belts while driving.  Do not drive if taking any narcotic, mind altering or controlled substances or recreational drugs or alcohol.

## 2019-02-08 ENCOUNTER — Encounter: Payer: Self-pay | Admitting: Cardiovascular Disease

## 2019-02-08 ENCOUNTER — Ambulatory Visit (INDEPENDENT_AMBULATORY_CARE_PROVIDER_SITE_OTHER): Payer: Medicare Other | Admitting: Cardiovascular Disease

## 2019-02-08 ENCOUNTER — Other Ambulatory Visit: Payer: Self-pay

## 2019-02-08 VITALS — BP 110/80 | HR 58 | Temp 98.9°F | Ht 62.0 in | Wt 200.0 lb

## 2019-02-08 DIAGNOSIS — I10 Essential (primary) hypertension: Secondary | ICD-10-CM | POA: Diagnosis not present

## 2019-02-08 DIAGNOSIS — I4821 Permanent atrial fibrillation: Secondary | ICD-10-CM

## 2019-02-08 DIAGNOSIS — Z713 Dietary counseling and surveillance: Secondary | ICD-10-CM

## 2019-02-08 DIAGNOSIS — Z9289 Personal history of other medical treatment: Secondary | ICD-10-CM

## 2019-02-08 DIAGNOSIS — E782 Mixed hyperlipidemia: Secondary | ICD-10-CM

## 2019-02-08 DIAGNOSIS — I5032 Chronic diastolic (congestive) heart failure: Secondary | ICD-10-CM | POA: Diagnosis not present

## 2019-02-08 MED ORDER — FUROSEMIDE 40 MG PO TABS: 40.0000 mg | ORAL_TABLET | ORAL | 1 refills | Status: DC

## 2019-02-08 NOTE — Progress Notes (Signed)
SUBJECTIVE: The patient presents for posthospitalization follow-up.  She was discharged on 02/04/2019 after being hospitalized for acute hypoxic respiratory failure secondary to acute on chronic diastolic heart failure.  She diuresed nearly 2 L with IV Lasix.  Echocardiogram on 01/22/2019 demonstrated normal LV systolic function, EF 55 to 60%.  There was mild aortic regurgitation.  She was noted to be noncompliant with a low-sodium diet.  She is here with her husband.  For the past 2 weeks she says she has been eating a no sodium diet.  She does not get much physical activity.  She does not get consistent sleep throughout the night and she is urinating frequently as she takes evening dose of Lasix at 9:30 PM.    Review of Systems: As per "subjective", otherwise negative.  Allergies  Allergen Reactions  . Ivp Dye [Iodinated Diagnostic Agents] Anaphylaxis and Swelling    Throat closes  . Latex     Latex tape pulls skin with it    Current Outpatient Medications  Medication Sig Dispense Refill  . acetaminophen (TYLENOL) 325 MG tablet Take 325-650 mg by mouth every 6 (six) hours as needed for mild pain, moderate pain or headache.    Marland Kitchen apixaban (ELIQUIS) 5 MG TABS tablet Take 1 tablet (5 mg total) by mouth 2 (two) times daily. 60 tablet 0  . ARIPiprazole (ABILIFY) 10 MG tablet Take 3 tablets (30 mg total) by mouth daily. 90 tablet 4  . atorvastatin (LIPITOR) 40 MG tablet TAKE 1 TABLET DAILY 90 tablet 0  . budesonide-formoterol (SYMBICORT) 80-4.5 MCG/ACT inhaler Inhale 2 puffs into the lungs 2 (two) times daily. (Patient taking differently: Inhale 2 puffs into the lungs 2 (two) times daily as needed (shortness of breath). ) 2 Inhaler 0  . carbamazepine (CARBATROL) 100 MG 12 hr capsule Take 1 capsule (100 mg total) by mouth daily. 90 capsule 1  . Cholecalciferol (VITAMIN D3) 1000 UNITS CAPS Take 1,000 Units by mouth daily.    Marland Kitchen diltiazem (CARDIZEM CD) 120 MG 24 hr capsule TAKE 1  CAPSULE AT BEDTIME (Patient taking differently: Take 120 mg by mouth at bedtime. ) 90 capsule 3  . Dulaglutide (TRULICITY) 1.5 0000000 SOPN Inject 1.5 mg into the skin once a week. 12 pen 3  . Eszopiclone (ESZOPICLONE) 3 MG TABS Take 1 tablet (3 mg total) by mouth at bedtime as needed. Take immediately before bedtime (Patient taking differently: Take 3 mg by mouth at bedtime as needed (sleep). Take immediately before bedtime) 90 tablet 1  . furosemide (LASIX) 20 MG tablet TAKE 1 TABLET TWICE A DAY (Patient taking differently: Take 20 mg by mouth 2 (two) times daily. ) 180 tablet 0  . Icosapent Ethyl (VASCEPA) 1 g CAPS Take 2 capsules (2 g total) by mouth 2 (two) times daily with a meal. 360 capsule 0  . Insulin Glargine (LANTUS SOLOSTAR) 100 UNIT/ML Solostar Pen INJECT 60 UNITS UNDER THE SKIN TWICE A DAY (Patient taking differently: Inject 60 Units into the skin 2 (two) times daily. ) 90 mL 0  . LORazepam (ATIVAN) 1 MG tablet Take 1 tab (1 mg) in the morning and 2 tabs (2 mg ) at bedtime (Patient taking differently: Take 1-2 mg by mouth See admin instructions. Take 1 tab (1 mg) in the morning and 2 tabs (2 mg ) at bedtime) 90 tablet 4  . metoprolol tartrate (LOPRESSOR) 100 MG tablet Take 1 tablet (100 mg total) by mouth 2 (two) times daily. Take 100mg  +  25mg  for a total of 125mg  twice a day.    . metoprolol tartrate (LOPRESSOR) 25 MG tablet Take 1 tablet (25 mg total) by mouth 2 (two) times daily. Take with metoprolol 100 mg to equal 125 mg (Patient taking differently: Take 25 mg by mouth 2 (two) times daily. Take 100mg  + 25mg  for a total of 125mg  twice a day.) 28 tablet 0  . naloxone (NARCAN) nasal spray 4 mg/0.1 mL Place 1 spray into the nose once as needed (opiod overdose).    . polyethylene glycol (MIRALAX / GLYCOLAX) 17 g packet Take 17 g by mouth 2 (two) times daily. 60 packet 0  . polyvinyl alcohol (LIQUIFILM TEARS) 1.4 % ophthalmic solution Place 1 drop into both eyes as needed for dry eyes.     . potassium chloride SA (K-DUR) 20 MEQ tablet Take 1 tablet (20 mEq total) by mouth daily. 30 tablet 1  . pregabalin (LYRICA) 150 MG capsule Take 1 capsule (150 mg total) by mouth 2 (two) times daily. 180 capsule 4  . rizatriptan (MAXALT-MLT) 10 MG disintegrating tablet DISSOLVE 1 TABLET ON THE TONGUE AT FIRST SIGN OF HEADACHE. MAY REPEAT ONE DOSE IN 1 HOUR IF NEEDED (Patient taking differently: Take 10 mg by mouth See admin instructions. DISSOLVE 1 TABLET ON THE TONGUE AT FIRST SIGN OF HEADACHE. MAY REPEAT ONE DOSE IN 1 HOUR IF NEEDED) 10 tablet 2  . topiramate (TOPAMAX) 25 MG tablet Take 2 tablets every night (Patient taking differently: Take 50 mg by mouth at bedtime. One in AM Take 2 tablets every night) 60 tablet 11  . traZODone (DESYREL) 100 MG tablet 4  qhs (Patient taking differently: Take 400 mg by mouth at bedtime. ) 360 tablet 1   No current facility-administered medications for this visit.    Facility-Administered Medications Ordered in Other Visits  Medication Dose Route Frequency Provider Last Rate Last Dose  . bupivacaine-epinephrine (MARCAINE W/ EPI) 0.5% -1:200000 injection    Anesthesia Intra-op Effie Berkshire, MD   12 mL at 01/21/19 0935    Past Medical History:  Diagnosis Date  . Atrial fibrillation (HCC)    PERMANENT  . Bipolar affective ([place])   . CAD (coronary artery disease)    NONOBSTRUCTIVE CAD; MGD BY DR. Bronson Ing   . CHF (congestive heart failure) (HCC)    DIASTOLIC  . Chronic back pain    LOWER   . Diabetes mellitus without complication ([place])    TYPE 2   . Hyperlipidemia   . Hypertension   . Morbid obesity due to excess calories ([place]) 07/06/2018  . Pain management   . Pulmonary HTN ([place])   . Renal insufficiency    DR [facility]      Past Surgical History:  Procedure Laterality Date  . BREAST REDUCTION SURGERY    . EYE SURGERY Right    cateracts  . HAMMER TOE SURGERY    . LEFT HEART CATH AND CORONARY ANGIOGRAPHY N/A 02/03/2018   Procedure: LEFT HEART  CATH AND CORONARY ANGIOGRAPHY;  Surgeon: Martinique, Peter M, MD;  Location: [facility];  Service: Cardiovascular;  Laterality: N/A;  . SHOULDER SURGERY Right    "I BROKE MY SHOUDLER  . THIGH SURGERY     "TO REMOVE A TUMOR "    Social History   Socioeconomic History  . Marital status: Married    Spouse name: Orpah Greek  . Number of children: 3  . Years of education: 16  . Highest education level: Bachelor's degree (e.g.,  BA, AB, BS)  Occupational History  . Occupation: Retired  Scientific laboratory technician  . Financial resource strain: Not hard at all  . Food insecurity    Worry: Never true    Inability: Never true  . Transportation needs    Medical: No    Non-medical: No  Tobacco Use  . Smoking status: Never Smoker  . Smokeless tobacco: Never Used  Substance and Sexual Activity  . Alcohol use: No    Alcohol/week: 0.0 standard drinks  . Drug use: No  . Sexual activity: Yes    Partners: Male    Birth control/protection: Post-menopausal  Lifestyle  . Physical activity    Days per week: 0 days    Minutes per session: 0 min  . Stress: Not at all  Relationships  . Social connections    Talks on phone: More than three times a week    Gets together: Once a week    Attends religious service: More than 4 times per year    Active member of club or organization: No    Attends meetings of clubs or organizations: Never    Relationship status: Married  . Intimate partner violence    Fear of current or ex partner: No    Emotionally abused: No    Physically abused: No    Forced sexual activity: No  Other Topics Concern  . Not on file  Social History Narrative   Lives at home with husband.       Right handed     Vitals:   02/08/19 1612  BP: 110/80  Pulse: (!) 58  Temp: 98.9 F (37.2 C)  SpO2: 98%  Weight: 200 lb (90.7 kg)  Height: 5\' 2"  (1.575 m)    Wt Readings from Last 3 Encounters:  02/08/19 200 lb (90.7 kg)  02/04/19 198 lb 6.6 oz (90 kg)  02/01/19 202 lb (91.6 kg)      PHYSICAL EXAM General: NAD HEENT: Normal. Neck: No JVD, no thyromegaly. Lungs: Clear to auscultation bilaterally with normal respiratory effort. CV: Regular rate and irregular rhythm, normal S1/S2, no S3, no murmur. No pretibial or periankle edema.  Abdomen: Soft, nontender, no distention.  Neurologic: Alert and oriented.  Psych: Normal affect. Skin: Normal. Musculoskeletal: No gross deformities.      Labs: Lab Results  Component Value Date/Time   K 3.5 02/03/2019 08:02 AM   BUN 21 02/03/2019 08:02 AM   BUN 15 04/09/2018 04:12 PM   CREATININE 0.95 02/03/2019 08:02 AM   ALT 16 01/21/2019 11:56 AM   TSH 1.922 05/19/2017 01:18 PM   TSH 3.550 01/04/2015 10:28 AM   HGB 13.7 02/01/2019 02:21 PM   HGB 14.2 05/27/2017 01:19 PM     Lipids: Lab Results  Component Value Date/Time   LDLCALC 71 01/04/2015 10:28 AM   LDLDIRECT 62 03/17/2016 04:14 PM   CHOL 163 01/04/2015 10:28 AM   TRIG 249 (H) 01/04/2015 10:28 AM   HDL 42 01/04/2015 10:28 AM      Cardiac catheterization 02/03/2018: Nonobstructive coronary artery disease with a proximal 40% LAD stenosis in mid LAD 30% stenosis. Ostial first marginal to first marginal lesion was 30% stenosed.LVEDP was moderately elevated.    ASSESSMENT AND PLAN: 1. Permanent atrial fibrillation:Symptomatically stable. Continuelong acting diltiazem 120 mg dailyandmetoprolol 25 mg twice daily. Anticoagulated withapixaban.   2. Chronic diastolic heart failure:Symptomatically stable.Currently takes Lasix 20 mg twice daily. I will switch this to Lasix 40 mg every morning.  She is also on supplemental potassium.  I told her she can take Lasix as needed for increased swelling, dyspnea, and weight gain of 3 pounds in 24 hours.  She was instructed to take extra potassium whenever she has to take extra Lasix.She was noted to be noncompliant with a low-sodium diet.  She claims to be adhering to a no sodium diet.  I will also make referral to  cardiac rehabilitation as she needs significant weight loss which will also help reduce CHF exacerbations.  3. Hypertension: Controlled. No changes.  4. Hyperlipidemia: Continue Lipitor 40 mg and Vascepa.     Disposition: Follow up 3 months with APP   Kate Sable, M.D., F.A.C.C.

## 2019-02-08 NOTE — Patient Instructions (Addendum)
Medication Instructions:   Change your Lasix to 40mg  every morning.   May take extra tab as needed for weight gain of 3 pounds in 24 hour time frame or 5 pounds in 1 week.  Continue all other medications.    Labwork: none  Testing/Procedures: none  Follow-Up: 3 months   Any Other Special Instructions Will Be Listed Below (If Applicable). You have been referred to:  Cardiac Rehab  If you need a refill on your cardiac medications before your next appointment, please call your pharmacy.

## 2019-02-09 ENCOUNTER — Encounter: Payer: Medicare Other | Admitting: *Deleted

## 2019-02-09 DIAGNOSIS — M25511 Pain in right shoulder: Secondary | ICD-10-CM | POA: Diagnosis not present

## 2019-02-11 ENCOUNTER — Ambulatory Visit: Payer: Self-pay | Admitting: Neurology

## 2019-02-14 ENCOUNTER — Telehealth: Payer: Self-pay | Admitting: Pulmonary Disease

## 2019-02-14 NOTE — Telephone Encounter (Signed)
Called and spoke with patient. No immediate needs, she just needed a hospital follow up scheduled. Patient scheduled with Wyn Quaker per pt request, also former [facility] patient will need new MD.   Nothing further needed at this time.

## 2019-02-15 ENCOUNTER — Other Ambulatory Visit: Payer: Self-pay | Admitting: Sports Medicine

## 2019-02-15 ENCOUNTER — Other Ambulatory Visit: Payer: Self-pay | Admitting: Family Medicine

## 2019-02-15 ENCOUNTER — Other Ambulatory Visit: Payer: Self-pay | Admitting: *Deleted

## 2019-02-15 ENCOUNTER — Telehealth: Payer: Self-pay | Admitting: Neurology

## 2019-02-15 DIAGNOSIS — I4821 Permanent atrial fibrillation: Secondary | ICD-10-CM

## 2019-02-15 DIAGNOSIS — M25561 Pain in right knee: Secondary | ICD-10-CM

## 2019-02-15 DIAGNOSIS — E782 Mixed hyperlipidemia: Secondary | ICD-10-CM

## 2019-02-15 DIAGNOSIS — I5032 Chronic diastolic (congestive) heart failure: Secondary | ICD-10-CM

## 2019-02-15 MED ORDER — FUROSEMIDE 40 MG PO TABS: 40.0000 mg | ORAL_TABLET | ORAL | 1 refills | Status: DC

## 2019-02-15 MED ORDER — APIXABAN 5 MG PO TABS: 5.0000 mg | ORAL_TABLET | Freq: Two times a day (BID) | ORAL | 1 refills | Status: DC

## 2019-02-15 NOTE — Telephone Encounter (Signed)
Pt need to have her headache medication Topamax called in to the [facility] in [place]

## 2019-02-15 NOTE — Telephone Encounter (Signed)
Topamax was sent on 01/26/19 with 11 refills to [facility] in [place].

## 2019-02-15 NOTE — Telephone Encounter (Signed)
Sent!

## 2019-02-16 ENCOUNTER — Other Ambulatory Visit: Payer: Self-pay

## 2019-02-16 ENCOUNTER — Ambulatory Visit (INDEPENDENT_AMBULATORY_CARE_PROVIDER_SITE_OTHER): Payer: Medicare Other

## 2019-02-16 ENCOUNTER — Ambulatory Visit
Admission: RE | Admit: 2019-02-16 | Discharge: 2019-02-16 | Disposition: A | Discharge status: Home / Self Care | Payer: Medicare Other | Source: Ambulatory Visit | Attending: Sports Medicine | Admitting: Sports Medicine

## 2019-02-16 DIAGNOSIS — I5032 Chronic diastolic (congestive) heart failure: Secondary | ICD-10-CM

## 2019-02-16 DIAGNOSIS — T402X5S Adverse effect of other opioids, sequela: Secondary | ICD-10-CM

## 2019-02-16 DIAGNOSIS — I482 Chronic atrial fibrillation, unspecified: Secondary | ICD-10-CM

## 2019-02-16 DIAGNOSIS — J9601 Acute respiratory failure with hypoxia: Secondary | ICD-10-CM

## 2019-02-16 DIAGNOSIS — Z794 Long term (current) use of insulin: Secondary | ICD-10-CM

## 2019-02-16 DIAGNOSIS — G43909 Migraine, unspecified, not intractable, without status migrainosus: Secondary | ICD-10-CM

## 2019-02-16 DIAGNOSIS — M25561 Pain in right knee: Secondary | ICD-10-CM

## 2019-02-16 DIAGNOSIS — K760 Fatty (change of) liver, not elsewhere classified: Secondary | ICD-10-CM

## 2019-02-16 DIAGNOSIS — F319 Bipolar disorder, unspecified: Secondary | ICD-10-CM

## 2019-02-16 DIAGNOSIS — E119 Type 2 diabetes mellitus without complications: Secondary | ICD-10-CM

## 2019-02-16 DIAGNOSIS — I110 Hypertensive heart disease with heart failure: Secondary | ICD-10-CM

## 2019-02-16 DIAGNOSIS — Z9181 History of falling: Secondary | ICD-10-CM

## 2019-02-16 DIAGNOSIS — J45909 Unspecified asthma, uncomplicated: Secondary | ICD-10-CM

## 2019-02-16 DIAGNOSIS — I272 Pulmonary hypertension, unspecified: Secondary | ICD-10-CM | POA: Diagnosis not present

## 2019-02-16 DIAGNOSIS — F411 Generalized anxiety disorder: Secondary | ICD-10-CM

## 2019-02-16 DIAGNOSIS — F1120 Opioid dependence, uncomplicated: Secondary | ICD-10-CM

## 2019-02-16 DIAGNOSIS — Z7901 Long term (current) use of anticoagulants: Secondary | ICD-10-CM

## 2019-02-16 DIAGNOSIS — K5903 Drug induced constipation: Secondary | ICD-10-CM

## 2019-02-21 ENCOUNTER — Other Ambulatory Visit (HOSPITAL_COMMUNITY): Payer: Self-pay

## 2019-02-21 MED ORDER — PREGABALIN 150 MG PO CAPS: 150.0000 mg | ORAL_CAPSULE | Freq: Two times a day (BID) | ORAL | 0 refills | Status: DC

## 2019-02-24 ENCOUNTER — Other Ambulatory Visit: Payer: Self-pay

## 2019-02-24 ENCOUNTER — Encounter: Payer: Self-pay | Admitting: Pulmonary Disease

## 2019-02-24 ENCOUNTER — Ambulatory Visit (INDEPENDENT_AMBULATORY_CARE_PROVIDER_SITE_OTHER): Payer: Medicare Other | Admitting: Pulmonary Disease

## 2019-02-24 DIAGNOSIS — J309 Allergic rhinitis, unspecified: Secondary | ICD-10-CM | POA: Insufficient documentation

## 2019-02-24 DIAGNOSIS — G4733 Obstructive sleep apnea (adult) (pediatric): Secondary | ICD-10-CM

## 2019-02-24 DIAGNOSIS — J4520 Mild intermittent asthma, uncomplicated: Secondary | ICD-10-CM

## 2019-02-24 HISTORY — DX: Obstructive sleep apnea (adult) (pediatric): G47.33

## 2019-02-24 HISTORY — DX: Allergic rhinitis, unspecified: J30.9

## 2019-02-24 NOTE — Assessment & Plan Note (Signed)
Plan: Start taking daily antihistamine Continue using Flonase nasal spray

## 2019-02-24 NOTE — Progress Notes (Signed)
Virtual Visit via Telephone Note  I connected with Amber Stephenson on 02/24/19 at  2:30 PM EDT by telephone and verified that I am speaking with the correct person using two identifiers.  Location: Patient: Home Provider: [facility] - [id number], [place], [place], [place] [address]   I discussed the limitations, risks, security and privacy concerns of performing an evaluation and management service by telephone and the availability of in person appointments. I also discussed with the patient that there may be a patient responsible charge related to this service. The patient expressed understanding and agreed to proceed.  Patient consented to consult via telephone: Yes People present and their role in pt care: Pt    History of Present Illness: 73 year old female never smoker followed in our office for asthma  PMH: Bipolar, type 2 diabetes, hyperlipidemia, insomnia, atrial fibrillation ([place]) Smoker/ Smoking History: Never smoker Maintenance: Symbicort 80 Pt of: Dr. [place] Bells   Chief complaint: Hospital discharge/follow-up  73 year old female never smoker completing a tele-visit with our office today.  Patient was discharged from the hospital on 02/04/2019.  Patient was treated for acute exacerbation of congestive heart failure.  Patient was given IV diuretics.  Patient is then followed back up with Dr. Bronson Ing with cardiology on 02/08/2019 patient reports that she is been doing well since hospital discharge.  Patient reports that her blood sugars have been controlled and she is been actively losing weight.  Last weight was 195 pounds this morning when patient weighed.  She does report that she feels that she may have gotten a head cold or had a flare of allergies over over the last week.  She has a dry cough.  She has not had to use her rescue inhaler at all.  She continues to use her Aller flow over-the-counter fluticasone nasal spray.   Of note, patient did complete  a home sleep study in May/2020 which showed severe obstructive sleep apnea.  Patient has not started CPAP therapy.  She would like to discuss this further at upcoming office visits.   Observations/Objective:  Flat affect   Chest imaging: May 2019 chest x-ray showed some basilar scarring versus atelectasis  PFT:  August 2019 ratio normal, FVC 1.09 L 41% predicted, total lung capacity 3.59 L 78% predicted, DLCO 14.1 mL 69% predicted  Overnight oxygen testing: January 31, 2018 on room air: O2 saturation less than 88% for 25 minutes, O2 saturation nadir 82%  Labs: August 2019 sed rate 24, aldolase normal 4.9, ANA negative, anti-Jo 1-, centromere negative, CCP negative, rheumatoid factor negative, SSA negative, SSB negative, SCL 70 neg  Echo: September 2016 echocardiogram LVEF 55 to 60%, left atrium severely dilated, trivial mitral regurgitation, PA systolic pressure 25 mmHg September 2019 echocardiogram normal LVEF but RVSP was 46 mmHg  Heart Catheterization: February 03, 2018 left heart catheterization: Proximal LAD 40%, mid LAD 30%, first obtuse marginal 30% stenosed, LVEDP moderately elevated (27 mmHg) to  10/09/2018 - HST  - AHI 40.6  Assessment and Plan:  OSA (obstructive sleep apnea) Patient declined starting CPAP therapy at this time she would like to discuss this in more detail at follow-up office visits She will contact our office if she decides that she would like to start CPAP therapy  Plan: We will bring patient in to establish with Dr. Halford Chessman for a 30-minute office visit over the next 2 to 4 weeks  Asthma Plan: Continue Symbicort 80 Continue Flonase nasal spray Can use rescue inhaler 2 puffs  every 4-6 hours as needed for shortness of breath or wheezing I would recommend the patient start taking a daily antihistamine  Allergic rhinitis Plan: Start taking daily antihistamine Continue using Flonase nasal spray   Follow Up Instructions:  Return in about 4  weeks (around 03/24/2019), or if symptoms worsen or fail to improve, for Follow up with Dr. Halford Chessman.   I discussed the assessment and treatment plan with the patient. The patient was provided an opportunity to ask questions and all were answered. The patient agreed with the plan and demonstrated an understanding of the instructions.   The patient was advised to call back or seek an in-person evaluation if the symptoms worsen or if the condition fails to improve as anticipated.  I provided 26 minutes of non-face-to-face time during this encounter.   Lauraine Rinne, NP

## 2019-02-24 NOTE — Assessment & Plan Note (Signed)
Plan: Continue Symbicort 80 Continue Flonase nasal spray Can use rescue inhaler 2 puffs every 4-6 hours as needed for shortness of breath or wheezing I would recommend the patient start taking a daily antihistamine

## 2019-02-24 NOTE — Assessment & Plan Note (Signed)
Patient declined starting CPAP therapy at this time she would like to discuss this in more detail at follow-up office visits She will contact our office if she decides that she would like to start CPAP therapy  Plan: We will bring patient in to establish with Dr. Halford Chessman for a 30-minute office visit over the next 2 to 4 weeks

## 2019-02-24 NOTE — Progress Notes (Signed)
Reviewed and agree with assessment/plan.   Costa Jha, MD [facility] 05/28/2016, 12:24 PM Pager:  [phone]  

## 2019-02-24 NOTE — Patient Instructions (Addendum)
You were seen today by Lauraine Rinne, NP  for:   1. Mild intermittent asthma without complication  Continue Symbicort 80 >>> 2 puffs in the morning right when you wake up, rinse out your mouth after use, 12 hours later 2 puffs, rinse after use >>> Take this daily, no matter what >>> This is not a rescue inhaler   Only use your albuterol as a rescue medication to be used if you can't catch your breath by resting or doing a relaxed purse lip breathing pattern.  - The less you use it, the better it will work when you need it. - Ok to use up to 2 puffs  every 4 hours if you must but call for immediate appointment if use goes up over your usual need - Don't leave home without it !!  (think of it like the spare tire for your car)   Start taking a daily antihistamine  Continue using Flonase nasal spray   2. OSA (obstructive sleep apnea)  Your home sleep study in May/2020 showed severe obstructive sleep apnea with a total of 40 events an hour.  We will bring you into our office to further discuss this study in more detail.  3. Allergic rhinitis, unspecified seasonality, unspecified trigger  Please start taking a daily antihistamine:  >>>choose one of: zyrtec, claritin, allegra, or xyzal  >>>these are over the counter medications  >>>can choose generic option  >>>take daily  >>>this medication helps with allergies, post nasal drip, and cough   Continue taking Flonase nasal spray   Follow Up:    Return in about 4 weeks (around 03/24/2019), or if symptoms worsen or fail to improve, for Follow up with Dr. Halford Chessman.   Please do your part to reduce the spread of COVID-19:      Reduce your risk of any infection  and COVID19 by using the similar precautions used for avoiding the common cold or flu:  Marland Kitchen Wash your hands often with soap and warm water for at least 20 seconds.  If soap and water are not readily available, use an alcohol-based hand sanitizer with at least 60% alcohol.  . If  coughing or sneezing, cover your mouth and nose by coughing or sneezing into the elbow areas of your shirt or coat, into a tissue or into your sleeve (not your hands). Langley Gauss A MASK when in public  . Avoid shaking hands with others and consider head nods or verbal greetings only. . Avoid touching your eyes, nose, or mouth with unwashed hands.  . Avoid close contact with people who are sick. . Avoid places or events with large numbers of people in one location, like concerts or sporting events. . If you have some symptoms but not all symptoms, continue to monitor at home and seek medical attention if your symptoms worsen. . If you are having a medical emergency, call 911.   [place] / e-Visit: [email or web address]         [facility]: [facility]: [id number]                   [facility]: [id number]     It is flu season:   >>> Best ways to protect herself from the flu: Receive the yearly flu vaccine, practice good hand hygiene washing with soap and also using hand sanitizer when available, eat a nutritious meals, get adequate rest, hydrate appropriately  Please contact the office if your symptoms worsen or you have concerns that you are not improving.   Thank you for choosing [facility] for your healthcare, and for allowing [place] to partner with you on your healthcare journey. I am thankful to be able to provide care to you today.   Wyn Quaker FNP-C

## 2019-02-28 DIAGNOSIS — M419 Scoliosis, unspecified: Secondary | ICD-10-CM | POA: Diagnosis not present

## 2019-02-28 DIAGNOSIS — Z79899 Other long term (current) drug therapy: Secondary | ICD-10-CM | POA: Diagnosis not present

## 2019-02-28 DIAGNOSIS — R809 Proteinuria, unspecified: Secondary | ICD-10-CM | POA: Diagnosis not present

## 2019-02-28 DIAGNOSIS — E559 Vitamin D deficiency, unspecified: Secondary | ICD-10-CM | POA: Diagnosis not present

## 2019-02-28 DIAGNOSIS — N183 Chronic kidney disease, stage 3 (moderate): Secondary | ICD-10-CM | POA: Diagnosis not present

## 2019-02-28 DIAGNOSIS — M545 Low back pain: Secondary | ICD-10-CM | POA: Diagnosis not present

## 2019-02-28 DIAGNOSIS — D649 Anemia, unspecified: Secondary | ICD-10-CM | POA: Diagnosis not present

## 2019-02-28 DIAGNOSIS — S32010A Wedge compression fracture of first lumbar vertebra, initial encounter for closed fracture: Secondary | ICD-10-CM | POA: Diagnosis not present

## 2019-02-28 DIAGNOSIS — I10 Essential (primary) hypertension: Secondary | ICD-10-CM | POA: Diagnosis not present

## 2019-03-02 ENCOUNTER — Encounter: Payer: Medicare Other | Admitting: Psychology

## 2019-03-02 ENCOUNTER — Other Ambulatory Visit: Payer: Self-pay

## 2019-03-03 DIAGNOSIS — E559 Vitamin D deficiency, unspecified: Secondary | ICD-10-CM | POA: Diagnosis not present

## 2019-03-03 DIAGNOSIS — R809 Proteinuria, unspecified: Secondary | ICD-10-CM | POA: Diagnosis not present

## 2019-03-03 DIAGNOSIS — E1129 Type 2 diabetes mellitus with other diabetic kidney complication: Secondary | ICD-10-CM | POA: Diagnosis not present

## 2019-03-03 DIAGNOSIS — E1122 Type 2 diabetes mellitus with diabetic chronic kidney disease: Secondary | ICD-10-CM | POA: Diagnosis not present

## 2019-03-03 DIAGNOSIS — N189 Chronic kidney disease, unspecified: Secondary | ICD-10-CM | POA: Diagnosis not present

## 2019-03-03 DIAGNOSIS — E211 Secondary hyperparathyroidism, not elsewhere classified: Secondary | ICD-10-CM | POA: Diagnosis not present

## 2019-03-04 ENCOUNTER — Other Ambulatory Visit: Payer: Self-pay

## 2019-03-04 ENCOUNTER — Ambulatory Visit (INDEPENDENT_AMBULATORY_CARE_PROVIDER_SITE_OTHER): Payer: Medicare Other | Admitting: Family

## 2019-03-04 ENCOUNTER — Encounter: Payer: Self-pay | Admitting: Family

## 2019-03-04 ENCOUNTER — Other Ambulatory Visit: Payer: Self-pay | Admitting: Family

## 2019-03-04 VITALS — BP 118/79 | HR 75 | Temp 98.4°F | Ht 62.0 in | Wt 204.8 lb

## 2019-03-04 DIAGNOSIS — Z23 Encounter for immunization: Secondary | ICD-10-CM | POA: Diagnosis not present

## 2019-03-04 DIAGNOSIS — G4733 Obstructive sleep apnea (adult) (pediatric): Secondary | ICD-10-CM

## 2019-03-04 DIAGNOSIS — Z09 Encounter for follow-up examination after completed treatment for conditions other than malignant neoplasm: Secondary | ICD-10-CM | POA: Diagnosis not present

## 2019-03-04 DIAGNOSIS — I10 Essential (primary) hypertension: Secondary | ICD-10-CM

## 2019-03-04 DIAGNOSIS — J4520 Mild intermittent asthma, uncomplicated: Secondary | ICD-10-CM | POA: Diagnosis not present

## 2019-03-04 DIAGNOSIS — I5032 Chronic diastolic (congestive) heart failure: Secondary | ICD-10-CM

## 2019-03-04 NOTE — Progress Notes (Signed)
Subjective:    Patient ID: Amber Stephenson, female    DOB: Dec 26, 1945, 73 y.o.   MRN: [id number]  Chief Complaint  Patient presents with  . Hospitalization Follow-up   PT presents to the office today for hospital follow up. She was seen in the office on 02/01/19 and sent to the ED for confusion and acute respiratory failure with hypoxia. She was treated for CHF exacerbation. She was given IV lasix and discharge on 02/04/19 and told to follow up with Cardiologists.   She reports she is feeling better now.   She is followed by Pulmonologist's every 3 months for asthma and OSA. She is followed by Pain Clinic once a month for chronic back pain. She gets oxycodone 5-325 mg #120. She is followed by [facility] every 3 months for depression and  bipolar and is currently getting Ativan 1 mg #90. She is followed by the Cardiologists for her CHF. She reports she weighs herself daily and her weight is stable. Her breathing is improved at this time.  She is followed by Nephrologists every 3 months for CKD.    Congestive Heart Failure Presents for follow-up visit. Associated symptoms include fatigue and orthopnea. Pertinent negatives include no edema or unexpected weight change. The symptoms have been stable.      Review of Systems  Constitutional: Positive for fatigue. Negative for unexpected weight change.  All other systems reviewed and are negative.      Objective:   Physical Exam Vitals signs reviewed.  Constitutional:      General: She is not in acute distress.    Appearance: She is well-developed.  HENT:     Head: Normocephalic and atraumatic.     Right Ear: Tympanic membrane normal.     Left Ear: Tympanic membrane normal.  Eyes:     Pupils: Pupils are equal, round, and reactive to light.  Neck:     Musculoskeletal: Normal range of motion and neck supple.     Thyroid: No thyromegaly.  Cardiovascular:     Rate and Rhythm: Normal rate and regular rhythm.     Heart sounds:  Normal heart sounds. No murmur.  Pulmonary:     Effort: Pulmonary effort is normal. No respiratory distress.     Breath sounds: Normal breath sounds. No wheezing.  Abdominal:     General: Bowel sounds are normal. There is no distension.     Palpations: Abdomen is soft.     Tenderness: There is no abdominal tenderness.  Musculoskeletal: Normal range of motion.        General: No tenderness.  Skin:    General: Skin is warm and dry.  Neurological:     Mental Status: She is alert and oriented to person, place, and time.     Cranial Nerves: No cranial nerve deficit.     Motor: Weakness present.     Gait: Gait normal.     Deep Tendon Reflexes: Reflexes are normal and symmetric.  Psychiatric:        Behavior: Behavior normal.        Thought Content: Thought content normal.        Judgment: Judgment normal.     BP 118/79   Pulse 75   Temp 98.4 F (36.9 C) (Temporal)   Ht 5\' 2"  (1.575 m)   Wt 204 lb 12.8 oz (92.9 kg)   SpO2 93%   BMI 37.46 kg/m      Assessment & Plan:  Amber Stephenson comes in  today with chief complaint of Hospitalization Follow-up   Diagnosis and orders addressed:  1. Essential hypertension  2. Chronic diastolic CHF (congestive heart failure) ([place])  3. OSA (obstructive sleep apnea)  4. Mild intermittent asthma without complication  5. Hospital discharge follow-up  6. Need for immunization against influenza - Flu Vaccine QUAD High Dose(Fluad)   Labs reviewed from Nephrologists that was drawn 02/28/19 Long discussion with patient that she should decrease her Ativan and oxycodone and she should not be on both of these medications at the same time. She states she understands. Neither of these medications are prescribed here.  Health Maintenance reviewed Diet and exercise encouraged  Follow up plan: Follow up with PCP to establish care in 3 months    Evelina Dun, FNP

## 2019-03-04 NOTE — Patient Instructions (Signed)
Heart Failure, Self Care Heart failure is a serious condition. This document explains the things you need to do to take care of yourself after a heart failure diagnosis. You may be asked to change your diet, take certain medicines, and make other lifestyle changes in order to stay as healthy as possible. Your health care provider may also give you more specific instructions. If you have problems or questions, contact your health care provider. What are the risks? Having heart failure puts you at higher risk for certain problems. These problems can get worse if you do not take good care of yourself. Problems may include:  Blood clotting problems. This may cause a stroke.  Damage to the kidneys, liver, or lungs.  Abnormal heart rhythms. Supplies needed:  Scale for monitoring weight.  Blood pressure monitor.  Notebook.  Medicines. How to care for yourself when you have heart failure Medicines Take over-the-counter and prescription medicines only as told by your health care provider. Medicines reduce the workload of your heart, slow the progression of heart failure, and improve symptoms. Take your medicines every day.  Do not stop taking your medicine unless your health care provider tells you to do so.  Do not skip any dose of medicine.  Refill your prescriptions before you run out of medicine. Eating and drinking   Eat heart-healthy foods. Talk with a dietitian to make an eating plan that is right for you. ? Choose foods that contain no trans fat and are low in saturated fat and cholesterol. Healthy choices include fresh or frozen fruits and vegetables, fish, lean meats, legumes, fat-free or low-fat dairy products, and whole-grain or high-fiber foods. ? Limit salt (sodium) if told by your health care provider. Sodium restriction may reduce symptoms of heart failure. Ask a dietitian to recommend heart-healthy seasonings. ? Use healthy cooking methods instead of frying. Healthy methods  include roasting, grilling, broiling, baking, poaching, steaming, and stir-frying.  Limit your fluid intake, if directed by your health care provider. Fluid restriction may reduce symptoms of heart failure. Alcohol use  Do not drink alcohol if: ? Your health care provider tells you not to drink. ? Your heart was damaged by alcohol, or you have severe heart failure. ? You are pregnant, may be pregnant, or are planning to become pregnant.  If you drink alcohol: ? Limit how much you use to:  0-1 drink a day for women.  0-2 drinks a day for men. ? Be aware of how much alcohol is in your drink. In the [place] one drink equals one 12 oz bottle of beer (355 mL), one 5 oz glass of wine (148 mL), or one 1 oz glass of hard liquor (44 mL). Lifestyle   Do not use any products that contain nicotine or tobacco, such as cigarettes, e-cigarettes, and chewing tobacco. If you need help quitting, ask your health care provider. ? Do not use nicotine gum or patches before talking to your health care provider.  Do not use illegal drugs.  Work with your health care provider to safely reach the right body weight.  Do physical activity if told by your health care provider. Talk to your health care provider before you begin an exercise if: ? You are an older adult. ? You have severe heart failure.  Learn to manage stress. If you need help to do this, ask your health care provider.  Participate in or seek rehabilitation as needed to keep or improve your independence and quality of life.  Plan  rest periods when you get tired. °Monitoring important information ° °· Weigh yourself every day. This will help you to notice if too much fluid is building up in your body. °? Weigh yourself every morning after you urinate and before you eat breakfast. °? Wear the same amount of clothing each time you weigh yourself. °? Record your daily weight. Provide your health care provider with your weight record. °· Monitor and  record your pulse and blood pressure as told by your health care provider. °Dealing with extreme temperatures °· If the weather is extremely hot: °? Avoid vigorous physical activity. °? Use air conditioning or fans, or find a cooler location. °? Avoid caffeine and alcohol. °? Wear loose-fitting, lightweight, and light-colored clothing. °· If the weather is extremely cold: °? Avoid vigorous activity. °? Layer your clothes. °? Wear mittens or gloves, a hat, and a scarf when you go outside. °? Avoid alcohol. °Follow these instructions at home: °· Stay up to date with vaccines. Pneumococcal and flu (influenza) vaccines are especially important in preventing infections of the airways. °· Keep all follow-up visits as told by your health care provider. This is important. °Contact a health care provider if you: °· Have a rapid weight gain. °· Have increasing shortness of breath. °· Are unable to participate in your usual physical activities. °· Get tired easily. °· Cough more than normal, especially with physical activity. °· Lose your appetite or feel nauseous. °· Have any swelling or more swelling in areas such as your hands, feet, ankles, or abdomen. °· Are unable to sleep because it is hard to breathe. °· Feel like your heart is beating quickly (palpitations). °· Become dizzy or light-headed when you stand up. °Get help right away if you: °· Have trouble breathing. °· Notice or your family notices a change in your awareness, such as having trouble staying awake or concentrating. °· Have pain or discomfort in your chest. °· Have an episode of fainting (syncope). °These symptoms may represent a serious problem that is an emergency. Do not wait to see if the symptoms will go away. Get medical help right away. Call your local emergency services (911 in the [place] Do not drive yourself to the hospital. °Summary °· Heart failure is a serious condition. To care for yourself, you may be asked to change your diet, take certain  medicines, and make other lifestyle changes. °· Take your medicines every day. Do not stop taking them unless your health care provider tells you to do so. °· Eat heart-healthy foods, such as fresh or frozen fruits and vegetables, fish, lean meats, legumes, fat-free or low-fat dairy products, and whole-grain or high-fiber foods. °· Ask your health care provider if you have any alcohol restrictions. You may have to stop drinking alcohol if you have severe heart failure. °· Contact your health care provider if you notice problems, such as rapid weight gain or a fast heartbeat. Get help right away if you faint, or have chest pain or trouble breathing. °This information is not intended to replace advice given to you by your health care provider. Make sure you discuss any questions you have with your health care provider. °Document Released: 09/01/2018 Document Revised: 08/31/2018 Document Reviewed: 09/01/2018 °Elsevier Patient Education © 2020 [employer] ° °

## 2019-03-09 ENCOUNTER — Encounter: Payer: Medicare Other | Admitting: Psychology

## 2019-03-09 ENCOUNTER — Telehealth: Payer: Self-pay | Admitting: *Deleted

## 2019-03-09 DIAGNOSIS — I5032 Chronic diastolic (congestive) heart failure: Secondary | ICD-10-CM

## 2019-03-09 DIAGNOSIS — R0602 Shortness of breath: Secondary | ICD-10-CM

## 2019-03-09 DIAGNOSIS — R059 Cough, unspecified: Secondary | ICD-10-CM

## 2019-03-09 DIAGNOSIS — R05 Cough: Secondary | ICD-10-CM

## 2019-03-09 NOTE — Telephone Encounter (Signed)
Patient informed and verbalized understanding. Reports limiting sodium in diet and has not taken any extra lasix.  Will go to [facility] to have lab work and cxr today or tomorrow.

## 2019-03-09 NOTE — Telephone Encounter (Signed)
Per home health nurse, patient reports having a productive cough with white sputum, weight gain (02/26/2019 194 lbs and today 199 lbs), swelling in abdomen and SOB.   O2 Sat 90 % on room air; BP 100/60; HR 68; Temp 97.5 Denies swelling other than abdomen, denies chest pain, dizziness  Medications reviewed Advised that message would be sent to provider for response and patient would be contacted.

## 2019-03-09 NOTE — Telephone Encounter (Signed)
As per my last office note in September 2020: "I told her she can take Lasix as needed for increased swelling, dyspnea, and weight gain of 3 pounds in 24 hours.  She was instructed to take extra potassium whenever she has to take extra Lasix.She was noted to be noncompliant with a low-sodium diet."  Did she try taking extra Lasix? Is she adhering to a low/no sodium diet? I would get a chest xray, CBC, and BMET. Increase Lasix to 40 mg bid x 3 days, then reduce back to 40 mg daily. Take extra KCl with extra Lasix.   Have her call us back on Friday and Monday with updates on weights/symptoms.

## 2019-03-10 ENCOUNTER — Other Ambulatory Visit (HOSPITAL_COMMUNITY)
Admission: RE | Admit: 2019-03-10 | Discharge: 2019-03-10 | Disposition: A | Discharge status: Home / Self Care | Payer: Medicare Other | Source: Ambulatory Visit | Attending: Cardiovascular Disease | Admitting: Cardiovascular Disease

## 2019-03-10 ENCOUNTER — Other Ambulatory Visit: Payer: Self-pay

## 2019-03-10 ENCOUNTER — Encounter (HOSPITAL_COMMUNITY): Payer: Medicare Other

## 2019-03-10 ENCOUNTER — Ambulatory Visit (HOSPITAL_COMMUNITY)
Admission: RE | Admit: 2019-03-10 | Discharge: 2019-03-10 | Disposition: A | Discharge status: Home / Self Care | Payer: Medicare Other | Source: Ambulatory Visit | Attending: Cardiovascular Disease | Admitting: Cardiovascular Disease

## 2019-03-10 DIAGNOSIS — I5032 Chronic diastolic (congestive) heart failure: Secondary | ICD-10-CM | POA: Diagnosis not present

## 2019-03-10 DIAGNOSIS — R05 Cough: Secondary | ICD-10-CM | POA: Insufficient documentation

## 2019-03-10 DIAGNOSIS — R0602 Shortness of breath: Secondary | ICD-10-CM | POA: Diagnosis not present

## 2019-03-10 LAB — BASIC METABOLIC PANEL
Anion gap: 12 (ref 5–15)
BUN: 22 mg/dL (ref 8–23)
CO2: 25 mmol/L (ref 22–32)
Calcium: 9.3 mg/dL (ref 8.9–10.3)
Chloride: 100 mmol/L (ref 98–111)
Creatinine, Ser: 0.89 mg/dL (ref 0.44–1.00)
GFR calc Af Amer: >60 mL/min (ref >60)
GFR calc non Af Amer: >60 mL/min (ref >60)
Glucose, Bld: 189 mg/dL — ABNORMAL HIGH (ref 70–99)
Potassium: 4.6 mmol/L (ref 3.5–5.1)
Sodium: 137 mmol/L (ref 135–145)

## 2019-03-10 LAB — CBC
HCT: 42.5 % (ref 36.0–46.0)
Hemoglobin: 13.9 g/dL (ref 12.0–15.0)
MCH: 31 pg (ref 26.0–34.0)
MCHC: 32.7 g/dL (ref 30.0–36.0)
MCV: 94.7 fL (ref 80.0–100.0)
Platelets: 168 10*3/uL (ref 150–400)
RBC: 4.49 MIL/uL (ref 3.87–5.11)
RDW: 13.5 % (ref 11.5–15.5)
WBC: 9.3 10*3/uL (ref 4.0–10.5)
nRBC: 0 % (ref 0.0–0.2)

## 2019-03-11 ENCOUNTER — Encounter: Payer: Self-pay | Admitting: Family

## 2019-03-11 ENCOUNTER — Telehealth: Payer: Self-pay | Admitting: *Deleted

## 2019-03-11 ENCOUNTER — Ambulatory Visit (INDEPENDENT_AMBULATORY_CARE_PROVIDER_SITE_OTHER): Payer: Medicare Other | Admitting: Family

## 2019-03-11 DIAGNOSIS — J208 Acute bronchitis due to other specified organisms: Secondary | ICD-10-CM | POA: Diagnosis not present

## 2019-03-11 DIAGNOSIS — B9689 Other specified bacterial agents as the cause of diseases classified elsewhere: Secondary | ICD-10-CM

## 2019-03-11 MED ORDER — AZITHROMYCIN 250 MG PO TABS: ORAL_TABLET | ORAL | 0 refills | Status: DC

## 2019-03-11 MED ORDER — PREDNISONE 10 MG (21) PO TBPK: ORAL_TABLET | ORAL | 0 refills | Status: DC

## 2019-03-11 NOTE — Progress Notes (Signed)
   Virtual Visit via telephone Note Due to COVID-19 pandemic this visit was conducted virtually. This visit type was conducted due to national recommendations for restrictions regarding the COVID-19 Pandemic (e.g. social distancing, sheltering in place) in an effort to limit this patient's exposure and mitigate transmission in our community. All issues noted in this document were discussed and addressed.  A physical exam was not performed with this format.  I connected with Amber Stephenson on 03/11/19 at 9:30 by telephone and verified that I am speaking with the correct person using two identifiers. Amber Stephenson is currently located at home and husband is currently with her during visit. The provider, Evelina Dun, FNP is located in their office at time of visit.  I discussed the limitations, risks, security and privacy concerns of performing an evaluation and management service by telephone and the availability of in person appointments. I also discussed with the patient that there may be a patient responsible charge related to this service. The patient expressed understanding and agreed to proceed.   History and Present Illness:  Cough This is a new problem. The current episode started 1 to 4 weeks ago. The problem has been gradually worsening. The problem occurs every few minutes. The cough is productive of sputum. Associated symptoms include headaches (some times), myalgias, nasal congestion and shortness of breath. Pertinent negatives include no chills, ear congestion, ear pain, fever, rhinorrhea or wheezing. She has tried rest and OTC cough suppressant for the symptoms. The treatment provided mild relief.      Review of Systems  Constitutional: Negative for chills and fever.  HENT: Negative for ear pain and rhinorrhea.   Respiratory: Positive for cough and shortness of breath. Negative for wheezing.   Musculoskeletal: Positive for myalgias.  Neurological: Positive for headaches  (some times).  All other systems reviewed and are negative.    Observations/Objective: No SOB or distress noted  Assessment and Plan: 1. Acute bacterial bronchitis - Take meds as prescribed - Use a cool mist humidifier  -Use saline nose sprays frequently -Force fluids -For any cough or congestion  Use plain Mucinex- regular strength or max strength is fine -For fever or aces or pains- take tylenol or ibuprofen. -Throat lozenges if help -New toothbrush in 3 days Call the office if symptoms worsen or do not improve  - azithromycin (ZITHROMAX) 250 MG tablet; Take 500 mg once, then 250 mg for four days  Dispense: 6 tablet; Refill: 0 - predniSONE (STERAPRED UNI-PAK 21 TAB) 10 MG (21) TBPK tablet; Use as directed  Dispense: 21 tablet; Refill: 0     I discussed the assessment and treatment plan with the patient. The patient was provided an opportunity to ask questions and all were answered. The patient agreed with the plan and demonstrated an understanding of the instructions.   The patient was advised to call back or seek an in-person evaluation if the symptoms worsen or if the condition fails to improve as anticipated.  The above assessment and management plan was discussed with the patient. The patient verbalized understanding of and has agreed to the management plan. Patient is aware to call the clinic if symptoms persist or worsen. Patient is aware when to return to the clinic for a follow-up visit. Patient educated on when it is appropriate to go to the emergency department.   Time call ended:  9:45 AM  I provided  15 minutes of non-face-to-face time during this encounter.    Evelina Dun, FNP

## 2019-03-11 NOTE — Telephone Encounter (Signed)
Notes recorded by Laurine Blazer, LPN on [id number] at [id number] PM EDT  Patient notified. Copy to pmd. Follow up scheduled for 03/15/19.  ------   Notes recorded by Herminio Commons, MD on 03/11/2019 at 10:28 AM EDT  No infection or CHF

## 2019-03-14 ENCOUNTER — Telehealth: Payer: Self-pay | Admitting: Cardiovascular Disease

## 2019-03-14 NOTE — Telephone Encounter (Signed)
° ° °  COVID-19 Pre-Screening Questions: ° °• In the past 7 to 10 days have you had a cough,  shortness of breath, headache, congestion, fever (100 or greater) body aches, chills, sore throat, or sudden loss of taste or sense of smell?   NO °• Have you been around anyone with known Covid 19.   NO °• Have you been around anyone who is awaiting Covid 19 test results in the past 7 to 10 days?   NO °• Have you been around anyone who has been exposed to Covid 19, or has mentioned symptoms of Covid 19 within the past 7 to 10 days?   NO ° °If you have any concerns/questions about symptoms patients report during screening (either on the phone or at threshold). Contact the provider seeing the patient or DOD for further guidance.  If neither are available contact a member of the leadership team. ° ° ° °   ° ° ° ° °

## 2019-03-15 ENCOUNTER — Other Ambulatory Visit: Payer: Self-pay | Admitting: *Deleted

## 2019-03-15 ENCOUNTER — Ambulatory Visit (INDEPENDENT_AMBULATORY_CARE_PROVIDER_SITE_OTHER): Payer: Medicare Other | Admitting: Cardiovascular Disease

## 2019-03-15 ENCOUNTER — Other Ambulatory Visit: Payer: Self-pay

## 2019-03-15 ENCOUNTER — Encounter: Payer: Self-pay | Admitting: Cardiovascular Disease

## 2019-03-15 VITALS — BP 114/78 | HR 64 | Ht 62.0 in | Wt 196.8 lb

## 2019-03-15 DIAGNOSIS — I5032 Chronic diastolic (congestive) heart failure: Secondary | ICD-10-CM | POA: Diagnosis not present

## 2019-03-15 DIAGNOSIS — I4821 Permanent atrial fibrillation: Secondary | ICD-10-CM

## 2019-03-15 DIAGNOSIS — E782 Mixed hyperlipidemia: Secondary | ICD-10-CM

## 2019-03-15 DIAGNOSIS — I10 Essential (primary) hypertension: Secondary | ICD-10-CM | POA: Diagnosis not present

## 2019-03-15 DIAGNOSIS — Z20822 Contact with and (suspected) exposure to covid-19: Secondary | ICD-10-CM

## 2019-03-15 NOTE — Progress Notes (Signed)
SUBJECTIVE: The patient presents for routine follow-up.  A home health nurse called our office on 03/09/2019 reporting that the patient had a productive cough with white sputum and a weight gain of 5 pounds with swelling in her abdomen along with shortness of breath.  Vital signs were stable.  I obtained a chest x-ray, CBC, and basic metabolic panel.  I increased Lasix to 40 mg twice daily for 3 days and then cut back to 40 mg daily.  BC and basic metabolic panel were normal.  Chest x-ray showed no acute cardiopulmonary disease.  Echocardiogram on 01/22/2019 demonstrated normal LV systolic function, EF 55 to 60%. There was mild aortic regurgitation.  The patient denies any symptoms of chest pain, palpitations, shortness of breath, lightheadedness, dizziness, leg swelling, orthopnea, PND, and syncope.  She says she is adhering to a low-sodium diet.   Review of Systems: As per "subjective", otherwise negative.  Allergies  Allergen Reactions  . Ivp Dye [Iodinated Diagnostic Agents] Anaphylaxis and Swelling    Throat closes  . Latex     Latex tape pulls skin with it    Current Outpatient Medications  Medication Sig Dispense Refill  . acetaminophen (TYLENOL) 325 MG tablet Take 325-650 mg by mouth every 6 (six) hours as needed for mild pain, moderate pain or headache.    Marland Kitchen apixaban (ELIQUIS) 5 MG TABS tablet Take 1 tablet (5 mg total) by mouth 2 (two) times daily. 180 tablet 1  . ARIPiprazole (ABILIFY) 10 MG tablet Take 3 tablets (30 mg total) by mouth daily. 90 tablet 4  . atorvastatin (LIPITOR) 40 MG tablet TAKE 1 TABLET DAILY 90 tablet 0  . budesonide-formoterol (SYMBICORT) 80-4.5 MCG/ACT inhaler Inhale 2 puffs into the lungs 2 (two) times daily. (Patient taking differently: Inhale 2 puffs into the lungs 2 (two) times daily as needed (shortness of breath). ) 2 Inhaler 0  . carbamazepine (CARBATROL) 100 MG 12 hr capsule Take 1 capsule (100 mg total) by mouth daily. 90 capsule 1  .  Cholecalciferol (VITAMIN D3) 1000 UNITS CAPS Take 1,000 Units by mouth daily.    Marland Kitchen diltiazem (CARDIZEM CD) 120 MG 24 hr capsule TAKE 1 CAPSULE AT BEDTIME (Patient taking differently: Take 120 mg by mouth at bedtime. ) 90 capsule 3  . Dulaglutide (TRULICITY) 1.5 0000000 SOPN Inject 1.5 mg into the skin once a week. 12 pen 3  . Eszopiclone (ESZOPICLONE) 3 MG TABS Take 1 tablet (3 mg total) by mouth at bedtime as needed. Take immediately before bedtime (Patient taking differently: Take 3 mg by mouth at bedtime as needed (sleep). Take immediately before bedtime) 90 tablet 1  . furosemide (LASIX) 40 MG tablet Take 1 tablet (40 mg total) by mouth every morning. 90 tablet 1  . Insulin Glargine (LANTUS SOLOSTAR) 100 UNIT/ML Solostar Pen INJECT 60 UNITS UNDER THE SKIN TWICE A DAY (Patient taking differently: Inject 60 Units into the skin 2 (two) times daily. ) 90 mL 0  . LORazepam (ATIVAN) 1 MG tablet Take 1 tab (1 mg) in the morning and 2 tabs (2 mg ) at bedtime (Patient taking differently: Take 1-2 mg by mouth See admin instructions. Take 1 tab (1 mg) in the morning and 2 tabs (2 mg ) at bedtime) 90 tablet 4  . metoprolol tartrate (LOPRESSOR) 100 MG tablet Take 1 tablet (100 mg total) by mouth 2 (two) times daily. Take 100mg  + 25mg  for a total of 125mg  twice a day.    Marland Kitchen  metoprolol tartrate (LOPRESSOR) 25 MG tablet Take 1 tablet (25 mg total) by mouth 2 (two) times daily. Take with metoprolol 100 mg to equal 125 mg (Patient taking differently: Take 25 mg by mouth 2 (two) times daily. Take 100mg  + 25mg  for a total of 125mg  twice a day.) 28 tablet 0  . naloxone (NARCAN) nasal spray 4 mg/0.1 mL Place 1 spray into the nose once as needed (opiod overdose).    Marland Kitchen oxyCODONE-acetaminophen (PERCOCET/ROXICET) 5-325 MG tablet     . polyvinyl alcohol (LIQUIFILM TEARS) 1.4 % ophthalmic solution Place 1 drop into both eyes as needed for dry eyes.    . potassium chloride SA (K-DUR) 20 MEQ tablet Take 1 tablet (20 mEq total) by  mouth daily. 30 tablet 1  . predniSONE (STERAPRED UNI-PAK 21 TAB) 10 MG (21) TBPK tablet Use as directed 21 tablet 0  . pregabalin (LYRICA) 150 MG capsule Take 1 capsule (150 mg total) by mouth 2 (two) times daily. 60 capsule 0  . PROAIR HFA 108 (90 Base) MCG/ACT inhaler     . rizatriptan (MAXALT-MLT) 10 MG disintegrating tablet DISSOLVE 1 TABLET ON THE TONGUE AT FIRST SIGN OF HEADACHE. MAY REPEAT ONE DOSE IN 1 HOUR IF NEEDED (Patient taking differently: Take 10 mg by mouth See admin instructions. DISSOLVE 1 TABLET ON THE TONGUE AT FIRST SIGN OF HEADACHE. MAY REPEAT ONE DOSE IN 1 HOUR IF NEEDED) 10 tablet 2  . topiramate (TOPAMAX) 25 MG tablet Take 2 tablets every night (Patient taking differently: Take 50 mg by mouth at bedtime. One in AM Take 2 tablets every night) 60 tablet 11  . traZODone (DESYREL) 100 MG tablet 4  qhs (Patient taking differently: Take 400 mg by mouth at bedtime. ) 360 tablet 1  . VASCEPA 1 g CAPS TAKE 2 CAPSULES TWICE A DAY WITH MEALS 360 capsule 3   No current facility-administered medications for this visit.    Facility-Administered Medications Ordered in Other Visits  Medication Dose Route Frequency Provider Last Rate Last Dose  . bupivacaine-epinephrine (MARCAINE W/ EPI) 0.5% -1:200000 injection    Anesthesia Intra-op Effie Berkshire, MD   12 mL at 01/21/19 0935    Past Medical History:  Diagnosis Date  . Atrial fibrillation (HCC)    PERMANENT  . Bipolar affective ([place])   . CAD (coronary artery disease)    NONOBSTRUCTIVE CAD; MGD BY DR. Bronson Ing   . CHF (congestive heart failure) (HCC)    DIASTOLIC  . Chronic back pain    LOWER   . Diabetes mellitus without complication ([place])    TYPE 2   . Hyperlipidemia   . Hypertension   . Morbid obesity due to excess calories ([place]) 07/06/2018  . Pain management   . Pulmonary HTN ([place])   . Renal insufficiency    DR [facility]      Past Surgical History:  Procedure Laterality Date  . BREAST REDUCTION SURGERY    . EYE  SURGERY Right    cateracts  . HAMMER TOE SURGERY    . LEFT HEART CATH AND CORONARY ANGIOGRAPHY N/A 02/03/2018   Procedure: LEFT HEART CATH AND CORONARY ANGIOGRAPHY;  Surgeon: Martinique, Peter M, MD;  Location: [facility];  Service: Cardiovascular;  Laterality: N/A;  . SHOULDER SURGERY Right    "I BROKE MY SHOUDLER  . THIGH SURGERY     "TO REMOVE A TUMOR "    Social History   Socioeconomic History  . Marital status: Married    Spouse  name: Orpah Greek  . Number of children: 3  . Years of education: 16  . Highest education level: Bachelor's degree (e.g., BA, AB, BS)  Occupational History  . Occupation: Retired  Scientific laboratory technician  . Financial resource strain: Not hard at all  . Food insecurity    Worry: Never true    Inability: Never true  . Transportation needs    Medical: No    Non-medical: No  Tobacco Use  . Smoking status: Never Smoker  . Smokeless tobacco: Never Used  Substance and Sexual Activity  . Alcohol use: No    Alcohol/week: 0.0 standard drinks  . Drug use: No  . Sexual activity: Yes    Partners: Male    Birth control/protection: Post-menopausal  Lifestyle  . Physical activity    Days per week: 0 days    Minutes per session: 0 min  . Stress: Not at all  Relationships  . Social connections    Talks on phone: More than three times a week    Gets together: Once a week    Attends religious service: More than 4 times per year    Active member of club or organization: No    Attends meetings of clubs or organizations: Never    Relationship status: Married  . Intimate partner violence    Fear of current or ex partner: No    Emotionally abused: No    Physically abused: No    Forced sexual activity: No  Other Topics Concern  . Not on file  Social History Narrative   Lives at home with husband.       Right handed     Vitals:   03/15/19 1601  BP: 114/78  Pulse: 64  SpO2: 96%  Weight: 196 lb 12.8 oz (89.3 kg)  Height: 5\' 2"  (1.575 m)    Wt Readings  from Last 3 Encounters:  03/15/19 196 lb 12.8 oz (89.3 kg)  03/04/19 204 lb 12.8 oz (92.9 kg)  02/08/19 200 lb (90.7 kg)     PHYSICAL EXAM General: NAD HEENT: Normal. Neck: No JVD, no thyromegaly. Lungs: Clear to auscultation bilaterally with normal respiratory effort. CV: Regular rate and  irregular rhythm, normal S1/S2, no S3, no murmur. No pretibial or periankle edema.     Abdomen: Soft, nontender, no distention.  Neurologic: Alert and oriented.  Psych: Normal affect. Skin: Normal. Musculoskeletal: No gross deformities.      Labs: Lab Results  Component Value Date/Time   K 4.6 03/10/2019 01:14 PM   BUN 22 03/10/2019 01:14 PM   BUN 15 04/09/2018 04:12 PM   CREATININE 0.89 03/10/2019 01:14 PM   ALT 16 01/21/2019 11:56 AM   TSH 1.922 05/19/2017 01:18 PM   TSH 3.550 01/04/2015 10:28 AM   HGB 13.9 03/10/2019 01:14 PM   HGB 14.2 05/27/2017 01:19 PM     Lipids: Lab Results  Component Value Date/Time   LDLCALC 71 01/04/2015 10:28 AM   LDLDIRECT 62 03/17/2016 04:14 PM   CHOL 163 01/04/2015 10:28 AM   TRIG 249 (H) 01/04/2015 10:28 AM   HDL 42 01/04/2015 10:28 AM      Cardiac catheterization 02/03/2018: Nonobstructivecoronary artery disease with a proximal 40% LAD stenosis in mid LAD 30% stenosis. Ostial first marginal to first marginal lesion was 30% stenosed.LVEDP was moderately elevated.   ASSESSMENT AND PLAN: 1. Permanent atrial fibrillation:Symptomatically stable. Continuelong acting diltiazem 120 mg dailyandmetoprolol25 mg twice daily. Anticoagulated withapixaban.  2. Chronic diastolic heart failure:Symptomatically improved since recent increase of  Lasix.  Weight down 4 pounds from 02/08/2019.Currently takes 40 mg every morning.  She is also on supplemental potassium. I told her she can take Lasix as needed for increased swelling, dyspnea, and weight gain of 3 pounds in 24 hours.  She was instructed to take extra potassium whenever she has to take  extra Lasix.She was noted to be noncompliant with a low-sodium diet prior to hospitalization earlier this year.  She claims to be adhering to a low sodium diet.  I previously made a referral to cardiac rehabilitation as she needs significant weight loss which will also help reduce CHF exacerbations.  3. Hypertension: Controlled. No changes.  4. Hyperlipidemia: Continue Lipitor 40 mg and Vascepa.     Disposition: Follow up 6 months with [facility]   Kate Sable, M.D., F.A.C.C.

## 2019-03-15 NOTE — Patient Instructions (Signed)

## 2019-03-17 LAB — NOVEL CORONAVIRUS, NAA: SARS-CoV-2, NAA: NOT DETECTED

## 2019-03-22 ENCOUNTER — Encounter: Payer: Medicare Other | Admitting: Psychology

## 2019-03-24 ENCOUNTER — Other Ambulatory Visit: Payer: Self-pay

## 2019-03-24 ENCOUNTER — Ambulatory Visit (INDEPENDENT_AMBULATORY_CARE_PROVIDER_SITE_OTHER): Payer: Medicare Other | Admitting: Psychiatry

## 2019-03-24 DIAGNOSIS — F3110 Bipolar disorder, current episode manic without psychotic features, unspecified: Secondary | ICD-10-CM | POA: Diagnosis not present

## 2019-03-24 MED ORDER — LORAZEPAM 1 MG PO TABS: 1.0000 mg | ORAL_TABLET | ORAL | 4 refills | Status: DC

## 2019-03-24 MED ORDER — ARIPIPRAZOLE 10 MG PO TABS: 30.0000 mg | ORAL_TABLET | Freq: Every day | ORAL | 2 refills | Status: DC

## 2019-03-24 MED ORDER — PREGABALIN 150 MG PO CAPS: 150.0000 mg | ORAL_CAPSULE | Freq: Two times a day (BID) | ORAL | 2 refills | Status: DC

## 2019-03-24 MED ORDER — TRAZODONE HCL 100 MG PO TABS: 400.0000 mg | ORAL_TABLET | Freq: Every day | ORAL | 1 refills | Status: DC

## 2019-03-24 NOTE — Progress Notes (Signed)
Patient ID: Amber Stephenson, female   DOB: 1945/08/23, 73 y.o.   MRN: [id number] [facility] MD Progress Note .she's going to apply 03/24/2019 1:55 PM EXIE HEIKKILA  MRN:  [id number] Subjective:  Doing well Principal Problem: Bipolar disorder manic type Diagnosis: Bipolar disorder Today the patient is seen with her husband.  Patient is generally doing quite well.  She did have a fall and she injured her ribs.  She believes she has a rib fracture.  She has a lot of pain from it.  Other than that she feels physically well.  She denies shortness of breath or chest pain or fevers.  Emotionally the patient is very well.  He denies depression or euphoria or irritability.  Her energy level is fair.  She is sleeping fairly well has a good appetite.  The patient has no evidence of psychosis.  She drinks no alcohol uses no drugs.  She is very pleased despite the fact that she is taking multiple psychotropic medications.  He is medically very stable and physically very stable.  Her weight is fairly controlled.  Her daughter is now incarcerated.  She has significant problems with substances and actually the patient is happy that her daughter is in prison as she is likely safer.  Other than that the patient has few psychosocial stressors.  She is handling the pandemic very well.  Past Medical History:  Diagnosis Date  . Atrial fibrillation (HCC)    PERMANENT  . Bipolar affective ([place])   . CAD (coronary artery disease)    NONOBSTRUCTIVE CAD; MGD BY DR. Bronson Ing   . CHF (congestive heart failure) (HCC)    DIASTOLIC  . Chronic back pain    LOWER   . Diabetes mellitus without complication ([place])    TYPE 2   . Hyperlipidemia   . Hypertension   . Morbid obesity due to excess calories ([place]) 07/06/2018  . Pain management   . Pulmonary HTN ([place])   . Renal insufficiency    DR [facility]      Past Surgical History:  Procedure Laterality Date  . BREAST REDUCTION SURGERY    . EYE SURGERY Right    cateracts  . HAMMER  TOE SURGERY    . LEFT HEART CATH AND CORONARY ANGIOGRAPHY N/A 02/03/2018   Procedure: LEFT HEART CATH AND CORONARY ANGIOGRAPHY;  Surgeon: Martinique, Peter M, MD;  Location: [place] [facility];  Service: Cardiovascular;  Laterality: N/A;  . SHOULDER SURGERY Right    "I BROKE MY SHOUDLER  . THIGH SURGERY     "TO REMOVE A TUMOR "   Family History:  Family History  Problem Relation Age of Onset  . Diabetes Mother   . Heart disease Mother   . Heart disease Brother   . Heart disease Sister        CABG  . Alcohol abuse Sister   . Mental illness Brother   . Diabetes Brother    Family Psychiatric  History:  Social History:  Social History   Substance and Sexual Activity  Alcohol Use No  . Alcohol/week: 0.0 standard drinks     Social History   Substance and Sexual Activity  Drug Use No    Social History   Socioeconomic History  . Marital status: Married    Spouse name: Orpah Greek  . Number of children: 3  . Years of education: 16  . Highest education level: Bachelor's degree (e.g., BA, AB, BS)  Occupational History  . Occupation: Retired  Scientific laboratory technician  .  Financial resource strain: Not hard at all  . Food insecurity    Worry: Never true    Inability: Never true  . Transportation needs    Medical: No    Non-medical: No  Tobacco Use  . Smoking status: Never Smoker  . Smokeless tobacco: Never Used  Substance and Sexual Activity  . Alcohol use: No    Alcohol/week: 0.0 standard drinks  . Drug use: No  . Sexual activity: Yes    Partners: Male    Birth control/protection: Post-menopausal  Lifestyle  . Physical activity    Days per week: 0 days    Minutes per session: 0 min  . Stress: Not at all  Relationships  . Social connections    Talks on phone: More than three times a week    Gets together: Once a week    Attends religious service: More than 4 times per year    Active member of club or organization: No    Attends meetings of clubs or organizations: Never     Relationship status: Married  Other Topics Concern  . Not on file  Social History Narrative   Lives at home with husband.       Right handed   Additional Social History:                         Sleep: Good  Appetite:  Good  Current Medications: Current Outpatient Medications  Medication Sig Dispense Refill  . acetaminophen (TYLENOL) 325 MG tablet Take 325-650 mg by mouth every 6 (six) hours as needed for mild pain, moderate pain or headache.    Marland Kitchen apixaban (ELIQUIS) 5 MG TABS tablet Take 1 tablet (5 mg total) by mouth 2 (two) times daily. 180 tablet 1  . ARIPiprazole (ABILIFY) 10 MG tablet Take 3 tablets (30 mg total) by mouth daily. 270 tablet 2  . atorvastatin (LIPITOR) 40 MG tablet TAKE 1 TABLET DAILY 90 tablet 0  . budesonide-formoterol (SYMBICORT) 80-4.5 MCG/ACT inhaler Inhale 2 puffs into the lungs 2 (two) times daily. (Patient taking differently: Inhale 2 puffs into the lungs 2 (two) times daily as needed (shortness of breath). ) 2 Inhaler 0  . carbamazepine (CARBATROL) 100 MG 12 hr capsule Take 1 capsule (100 mg total) by mouth daily. 90 capsule 1  . Cholecalciferol (VITAMIN D3) 1000 UNITS CAPS Take 1,000 Units by mouth daily.    Marland Kitchen diltiazem (CARDIZEM CD) 120 MG 24 hr capsule TAKE 1 CAPSULE AT BEDTIME (Patient taking differently: Take 120 mg by mouth at bedtime. ) 90 capsule 3  . Dulaglutide (TRULICITY) 1.5 0000000 SOPN Inject 1.5 mg into the skin once a week. 12 pen 3  . Eszopiclone (ESZOPICLONE) 3 MG TABS Take 1 tablet (3 mg total) by mouth at bedtime as needed. Take immediately before bedtime (Patient taking differently: Take 3 mg by mouth at bedtime as needed (sleep). Take immediately before bedtime) 90 tablet 1  . furosemide (LASIX) 40 MG tablet Take 1 tablet (40 mg total) by mouth every morning. 90 tablet 1  . Insulin Glargine (LANTUS SOLOSTAR) 100 UNIT/ML Solostar Pen INJECT 60 UNITS UNDER THE SKIN TWICE A DAY (Patient taking differently: Inject 60 Units into the  skin 2 (two) times daily. ) 90 mL 0  . LORazepam (ATIVAN) 1 MG tablet Take 1-2 tablets (1-2 mg total) by mouth See admin instructions. Take 1 tab (1 mg) in the morning and 2 tabs (2 mg ) at bedtime 90 tablet  4  . metoprolol tartrate (LOPRESSOR) 100 MG tablet Take 1 tablet (100 mg total) by mouth 2 (two) times daily. Take 100mg  + 25mg  for a total of 125mg  twice a day.    . metoprolol tartrate (LOPRESSOR) 25 MG tablet Take 1 tablet (25 mg total) by mouth 2 (two) times daily. Take with metoprolol 100 mg to equal 125 mg (Patient taking differently: Take 25 mg by mouth 2 (two) times daily. Take 100mg  + 25mg  for a total of 125mg  twice a day.) 28 tablet 0  . naloxone (NARCAN) nasal spray 4 mg/0.1 mL Place 1 spray into the nose once as needed (opiod overdose).    Marland Kitchen oxyCODONE-acetaminophen (PERCOCET/ROXICET) 5-325 MG tablet     . polyvinyl alcohol (LIQUIFILM TEARS) 1.4 % ophthalmic solution Place 1 drop into both eyes as needed for dry eyes.    . potassium chloride SA (K-DUR) 20 MEQ tablet Take 1 tablet (20 mEq total) by mouth daily. 30 tablet 1  . predniSONE (STERAPRED UNI-PAK 21 TAB) 10 MG (21) TBPK tablet Use as directed 21 tablet 0  . pregabalin (LYRICA) 150 MG capsule Take 1 capsule (150 mg total) by mouth 2 (two) times daily. 180 capsule 2  . PROAIR HFA 108 (90 Base) MCG/ACT inhaler     . rizatriptan (MAXALT-MLT) 10 MG disintegrating tablet DISSOLVE 1 TABLET ON THE TONGUE AT FIRST SIGN OF HEADACHE. MAY REPEAT ONE DOSE IN 1 HOUR IF NEEDED (Patient taking differently: Take 10 mg by mouth See admin instructions. DISSOLVE 1 TABLET ON THE TONGUE AT FIRST SIGN OF HEADACHE. MAY REPEAT ONE DOSE IN 1 HOUR IF NEEDED) 10 tablet 2  . topiramate (TOPAMAX) 25 MG tablet Take 2 tablets every night (Patient taking differently: Take 50 mg by mouth at bedtime. One in AM Take 2 tablets every night) 60 tablet 11  . traZODone (DESYREL) 100 MG tablet Take 4 tablets (400 mg total) by mouth at bedtime. 360 tablet 1  . VASCEPA 1 g  CAPS TAKE 2 CAPSULES TWICE A DAY WITH MEALS 360 capsule 3   No current facility-administered medications for this visit.    Facility-Administered Medications Ordered in Other Visits  Medication Dose Route Frequency Provider Last Rate Last Dose  . bupivacaine-epinephrine (MARCAINE W/ EPI) 0.5% -1:200000 injection    Anesthesia Intra-op Effie Berkshire, MD   12 mL at 01/21/19 0935    Lab Results: No results found for this or any previous visit (from the past 48 hour(s)).  Blood Alcohol level:  Lab Results  Component Value Date   ETH <5 10/01/2015    Physical Findings: AIMS:  , ,  ,  ,    CIWA:    COWS:     Musculoskeletal: Strength & Muscle Tone: within normal limits Gait & Station: normal Patient leans: N/A  Psychiatric Specialty Exam: ROS Patient ID: Amber Stephenson, female   DOB: 13-Jul-1945, 72 y.o.   MRN: [id number] [facility] MD Progress Note .she's going to apply 03/24/2019 1:55 PM PARIDHI WISMAN  MRN:  [id number] Subjective:  Doing well Principal Problem: Bipolar disorder most recent episode manic Diagnosis:  Bipolar disorder most recent   Today the patient's first problem is that of bipolar disorder most recent episode was mania.  She notes he takes multiple tropic medications for this.  She takes Abilify 30 mg which she is been on for well over 5 or 10 years.  She shows no evidence of tardive dyskinesia.  She takes Tegretol 100 mg a day in the  next week we will get some blood work regarding her Tegretol including level.  Patient also takes Lyrica 150 mg twice daily.  All 3 of these medications are used for her bipolar disorder.  Her second problem is that of insomnia.  The patient takes trazodone and doxepin for this.  The patient does very well taking all these medications and is very stable. Past Medical History:  Diagnosis Date  . Atrial fibrillation (HCC)    PERMANENT  . Bipolar affective ([place])   . CAD (coronary artery disease)    NONOBSTRUCTIVE CAD; MGD BY DR.  Bronson Ing   . CHF (congestive heart failure) (HCC)    DIASTOLIC  . Chronic back pain    LOWER   . Diabetes mellitus without complication ([place])    TYPE 2   . Hyperlipidemia   . Hypertension   . Morbid obesity due to excess calories ([place]) 07/06/2018  . Pain management   . Pulmonary HTN ([place])   . Renal insufficiency    DR [name]      Past Surgical History:  Procedure Laterality Date  . BREAST REDUCTION SURGERY    . EYE SURGERY Right    cateracts  . HAMMER TOE SURGERY    . LEFT HEART CATH AND CORONARY ANGIOGRAPHY N/A 02/03/2018   Procedure: LEFT HEART CATH AND CORONARY ANGIOGRAPHY;  Surgeon: Martinique, Peter M, MD;  Location: [facility];  Service: Cardiovascular;  Laterality: N/A;  . SHOULDER SURGERY Right    "I BROKE MY SHOUDLER  . THIGH SURGERY     "TO REMOVE A TUMOR "   Family History:  Family History  Problem Relation Age of Onset  . Diabetes Mother   . Heart disease Mother   . Heart disease Brother   . Heart disease Sister        CABG  . Alcohol abuse Sister   . Mental illness Brother   . Diabetes Brother    Family Psychiatric  History:  Social History:  Social History   Substance and Sexual Activity  Alcohol Use No  . Alcohol/week: 0.0 standard drinks     Social History   Substance and Sexual Activity  Drug Use No    Social History   Socioeconomic History  . Marital status: Married    Spouse name: Orpah Greek  . Number of children: 3  . Years of education: 16  . Highest education level: Bachelor's degree (e.g., BA, AB, BS)  Occupational History  . Occupation: Retired  Scientific laboratory technician  . Financial resource strain: Not hard at all  . Food insecurity    Worry: Never true    Inability: Never true  . Transportation needs    Medical: No    Non-medical: No  Tobacco Use  . Smoking status: Never Smoker  . Smokeless tobacco: Never Used  Substance and Sexual Activity  . Alcohol use: No    Alcohol/week: 0.0 standard drinks  . Drug use: No  . Sexual  activity: Yes    Partners: Male    Birth control/protection: Post-menopausal  Lifestyle  . Physical activity    Days per week: 0 days    Minutes per session: 0 min  . Stress: Not at all  Relationships  . Social connections    Talks on phone: More than three times a week    Gets together: Once a week    Attends religious service: More than 4 times per year    Active member of club or organization: No  Attends meetings of clubs or organizations: Never    Relationship status: Married  Other Topics Concern  . Not on file  Social History Narrative   Lives at home with husband.       Right handed   Additional Social History:                         Sleep: Good  Appetite:  Good  Current Medications: Current Outpatient Medications  Medication Sig Dispense Refill  . acetaminophen (TYLENOL) 325 MG tablet Take 325-650 mg by mouth every 6 (six) hours as needed for mild pain, moderate pain or headache.    Marland Kitchen apixaban (ELIQUIS) 5 MG TABS tablet Take 1 tablet (5 mg total) by mouth 2 (two) times daily. 180 tablet 1  . ARIPiprazole (ABILIFY) 10 MG tablet Take 3 tablets (30 mg total) by mouth daily. 270 tablet 2  . atorvastatin (LIPITOR) 40 MG tablet TAKE 1 TABLET DAILY 90 tablet 0  . budesonide-formoterol (SYMBICORT) 80-4.5 MCG/ACT inhaler Inhale 2 puffs into the lungs 2 (two) times daily. (Patient taking differently: Inhale 2 puffs into the lungs 2 (two) times daily as needed (shortness of breath). ) 2 Inhaler 0  . carbamazepine (CARBATROL) 100 MG 12 hr capsule Take 1 capsule (100 mg total) by mouth daily. 90 capsule 1  . Cholecalciferol (VITAMIN D3) 1000 UNITS CAPS Take 1,000 Units by mouth daily.    Marland Kitchen diltiazem (CARDIZEM CD) 120 MG 24 hr capsule TAKE 1 CAPSULE AT BEDTIME (Patient taking differently: Take 120 mg by mouth at bedtime. ) 90 capsule 3  . Dulaglutide (TRULICITY) 1.5 0000000 SOPN Inject 1.5 mg into the skin once a week. 12 pen 3  . Eszopiclone (ESZOPICLONE) 3 MG TABS  Take 1 tablet (3 mg total) by mouth at bedtime as needed. Take immediately before bedtime (Patient taking differently: Take 3 mg by mouth at bedtime as needed (sleep). Take immediately before bedtime) 90 tablet 1  . furosemide (LASIX) 40 MG tablet Take 1 tablet (40 mg total) by mouth every morning. 90 tablet 1  . Insulin Glargine (LANTUS SOLOSTAR) 100 UNIT/ML Solostar Pen INJECT 60 UNITS UNDER THE SKIN TWICE A DAY (Patient taking differently: Inject 60 Units into the skin 2 (two) times daily. ) 90 mL 0  . LORazepam (ATIVAN) 1 MG tablet Take 1-2 tablets (1-2 mg total) by mouth See admin instructions. Take 1 tab (1 mg) in the morning and 2 tabs (2 mg ) at bedtime 90 tablet 4  . metoprolol tartrate (LOPRESSOR) 100 MG tablet Take 1 tablet (100 mg total) by mouth 2 (two) times daily. Take 100mg  + 25mg  for a total of 125mg  twice a day.    . metoprolol tartrate (LOPRESSOR) 25 MG tablet Take 1 tablet (25 mg total) by mouth 2 (two) times daily. Take with metoprolol 100 mg to equal 125 mg (Patient taking differently: Take 25 mg by mouth 2 (two) times daily. Take 100mg  + 25mg  for a total of 125mg  twice a day.) 28 tablet 0  . naloxone (NARCAN) nasal spray 4 mg/0.1 mL Place 1 spray into the nose once as needed (opiod overdose).    Marland Kitchen oxyCODONE-acetaminophen (PERCOCET/ROXICET) 5-325 MG tablet     . polyvinyl alcohol (LIQUIFILM TEARS) 1.4 % ophthalmic solution Place 1 drop into both eyes as needed for dry eyes.    . potassium chloride SA (K-DUR) 20 MEQ tablet Take 1 tablet (20 mEq total) by mouth daily. 30 tablet 1  . predniSONE (  STERAPRED UNI-PAK 21 TAB) 10 MG (21) TBPK tablet Use as directed 21 tablet 0  . pregabalin (LYRICA) 150 MG capsule Take 1 capsule (150 mg total) by mouth 2 (two) times daily. 180 capsule 2  . PROAIR HFA 108 (90 Base) MCG/ACT inhaler     . rizatriptan (MAXALT-MLT) 10 MG disintegrating tablet DISSOLVE 1 TABLET ON THE TONGUE AT FIRST SIGN OF HEADACHE. MAY REPEAT ONE DOSE IN 1 HOUR IF NEEDED  (Patient taking differently: Take 10 mg by mouth See admin instructions. DISSOLVE 1 TABLET ON THE TONGUE AT FIRST SIGN OF HEADACHE. MAY REPEAT ONE DOSE IN 1 HOUR IF NEEDED) 10 tablet 2  . topiramate (TOPAMAX) 25 MG tablet Take 2 tablets every night (Patient taking differently: Take 50 mg by mouth at bedtime. One in AM Take 2 tablets every night) 60 tablet 11  . traZODone (DESYREL) 100 MG tablet Take 4 tablets (400 mg total) by mouth at bedtime. 360 tablet 1  . VASCEPA 1 g CAPS TAKE 2 CAPSULES TWICE A DAY WITH MEALS 360 capsule 3   No current facility-administered medications for this visit.    Facility-Administered Medications Ordered in Other Visits  Medication Dose Route Frequency Provider Last Rate Last Dose  . bupivacaine-epinephrine (MARCAINE W/ EPI) 0.5% -1:200000 injection    Anesthesia Intra-op Effie Berkshire, MD   12 mL at 01/21/19 0935    Lab Results: No results found for this or any previous visit (from the past 48 hour(s)).  Blood Alcohol level:  Lab Results  Component Value Date   ETH <5 10/01/2015    Physical Findings: AIMS:  , ,  ,  ,    CIWA:    COWS:     Musculoskeletal: Strength & Muscle Tone: within normal limits Gait & Station: normal Patient leans: N/A  Psychiatric Specialty Exam: ROS  There were no vitals taken for this visit.There is no height or weight on file to calculate BMI.  General Appearance: Casual  Eye Contact::  Good  Speech:  Clear and Coherent  Volume:  Normal  Mood:  Euthymic  Affect:  Appropriate  Thought Process:  Coherent  Orientation:  Full (Time, Place, and Person)  Thought Content:  WDL  Suicidal Thoughts:  No  Homicidal Thoughts:  No  Memory:  NA  Judgement:  Good  Insight:  Good  Psychomotor Activity:  Normal  Concentration:  Good  Recall:  Good  Fund of Knowledge:Good  Language: Good  Akathisia:  No  Handed:  Right  AIMS (if indicated):     Assets: Good spirits   ADL's:  Intact  Cognition: WNL  Sleep:      eks Treatment Plan Summary: This patient's first admission important problem is bipolar disorder. She takes multiple medications for this condition. This includes Tegretol and Abilify. At this time the patient is not in therapy. She'll make an attempt to get a therapist in her community. She's taking a moderate to high dose of Ativan 1 mg one in the morning and 2 at night and requested more of it. At this time we will not increase the dose of Ativan. We will have her response to her second problem which is her problems sleeping. She takes 3 trazodone to sleep and doesn't stay asleep. At this time she'll continue taking the trazodone but she'll begin on doxepin 50 mg in addition. Today we spent more than 50% of the time talking about her medications and educating her. She understands that she cannot drink alcohol with  her medications and she'll actually does not. The patient stays busy and active. She does importance of being in therapy and she'll actively looking for a therapist at this time. She'll return to see me in 3 months for a 30 minute visit. At that time consider getting a Tegretol blood level and other blood work.patient denies any physical complaints of chest pain shortness of breath.  There were no vitals taken for this visit.There is no height or weight on file to calculate BMI.  General Appearance: Casual  Eye Contact::  Good  Speech:  Clear and Coherent  Volume:  Normal  Mood:  Euthymic  Affect:  Appropriate  Thought Process:  Coherent  Orientation:  Full (Time, Place, and Person)  Thought Content:  WDL  Suicidal Thoughts:  No  Homicidal Thoughts:  No  Memory:  NA  Judgement:  Good  Insight:  Good  Psychomotor Activity:  Normal  Concentration:  Good  Recall:  Good  Fund of Knowledge:Good  Language: Good  Akathisia:  No  Handed:  Right  AIMS (if indicated):     Assets: Good spirits   ADL's:  Intact  Cognition: WNL  Sleep:     eks Treatment Plan Summary:  Today the  patient is doing well.  It is noted that she is not had any blood work in a while.  We will go ahead and make arranges for her to get it at [facility].  The patient will continue taking Tegretol 100 mg once a day.  This is for her first problem of bipolar disorder.  At this time she is doing great.  Should continue taking her Abilify as prescribed as well.  Her second problem is that of insomnia.  Should continue taking trazodone which we will increase to 100 mg for today.  She no longer takes doxepin.  Patient will continue taking Ativan 1 mg 1 in the morning and 2 at night.  I suspect overall this helps both her bipolar state as well as her insomnia.  Patient will also continue taking Lunesta 3 mg as prescribed.  This patient will be seen again in 3 months.  I believe she is very stable.  She is functioning well.  The blood work the patient needs is a morning Tegretol level, comprehensive metabolic panel and a CBC.

## 2019-03-25 ENCOUNTER — Ambulatory Visit (INDEPENDENT_AMBULATORY_CARE_PROVIDER_SITE_OTHER): Payer: Medicare Other | Admitting: Psychology

## 2019-03-25 ENCOUNTER — Other Ambulatory Visit: Payer: Self-pay

## 2019-03-25 ENCOUNTER — Encounter: Payer: Self-pay | Admitting: Psychology

## 2019-03-25 DIAGNOSIS — R4189 Other symptoms and signs involving cognitive functions and awareness: Secondary | ICD-10-CM | POA: Diagnosis not present
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
## 2019-03-27 ENCOUNTER — Emergency Department (HOSPITAL_COMMUNITY): Payer: Medicare Other

## 2019-03-27 ENCOUNTER — Encounter (HOSPITAL_COMMUNITY): Payer: Self-pay

## 2019-03-27 ENCOUNTER — Other Ambulatory Visit: Payer: Self-pay

## 2019-03-27 ENCOUNTER — Ambulatory Visit (INDEPENDENT_AMBULATORY_CARE_PROVIDER_SITE_OTHER)
Admission: EM | Admit: 2019-03-27 | Discharge: 2019-03-27 | Disposition: A | Discharge status: Home / Self Care | Payer: Medicare Other | Source: Home / Self Care | Attending: Family Medicine | Admitting: Family Medicine

## 2019-03-27 ENCOUNTER — Emergency Department (HOSPITAL_COMMUNITY)
Admission: EM | Admit: 2019-03-27 | Discharge: 2019-03-27 | Disposition: A | Discharge status: Home / Self Care | Payer: Medicare Other | Attending: Emergency Medicine | Admitting: Emergency Medicine

## 2019-03-27 DIAGNOSIS — W19XXXA Unspecified fall, initial encounter: Secondary | ICD-10-CM

## 2019-03-27 DIAGNOSIS — I2510 Atherosclerotic heart disease of native coronary artery without angina pectoris: Secondary | ICD-10-CM | POA: Diagnosis not present

## 2019-03-27 DIAGNOSIS — Y929 Unspecified place or not applicable: Secondary | ICD-10-CM | POA: Insufficient documentation

## 2019-03-27 DIAGNOSIS — S8002XA Contusion of left knee, initial encounter: Secondary | ICD-10-CM | POA: Insufficient documentation

## 2019-03-27 DIAGNOSIS — Y939 Activity, unspecified: Secondary | ICD-10-CM | POA: Insufficient documentation

## 2019-03-27 DIAGNOSIS — M79602 Pain in left arm: Secondary | ICD-10-CM | POA: Diagnosis not present

## 2019-03-27 DIAGNOSIS — I5032 Chronic diastolic (congestive) heart failure: Secondary | ICD-10-CM | POA: Insufficient documentation

## 2019-03-27 DIAGNOSIS — S2232XA Fracture of one rib, left side, initial encounter for closed fracture: Secondary | ICD-10-CM

## 2019-03-27 DIAGNOSIS — M545 Low back pain, unspecified: Secondary | ICD-10-CM

## 2019-03-27 DIAGNOSIS — S5012XA Contusion of left forearm, initial encounter: Secondary | ICD-10-CM | POA: Diagnosis not present

## 2019-03-27 DIAGNOSIS — Y999 Unspecified external cause status: Secondary | ICD-10-CM | POA: Diagnosis not present

## 2019-03-27 DIAGNOSIS — Y92000 Kitchen of unspecified non-institutional (private) residence as  the place of occurrence of the external cause: Secondary | ICD-10-CM | POA: Diagnosis not present

## 2019-03-27 DIAGNOSIS — S299XXA Unspecified injury of thorax, initial encounter: Secondary | ICD-10-CM | POA: Diagnosis not present

## 2019-03-27 DIAGNOSIS — W1830XA Fall on same level, unspecified, initial encounter: Secondary | ICD-10-CM

## 2019-03-27 DIAGNOSIS — I110 Hypertensive heart disease with heart failure: Secondary | ICD-10-CM | POA: Diagnosis not present

## 2019-03-27 DIAGNOSIS — M79622 Pain in left upper arm: Secondary | ICD-10-CM | POA: Diagnosis not present

## 2019-03-27 DIAGNOSIS — W010XXA Fall on same level from slipping, tripping and stumbling without subsequent striking against object, initial encounter: Secondary | ICD-10-CM | POA: Insufficient documentation

## 2019-03-27 DIAGNOSIS — S60212A Contusion of left wrist, initial encounter: Secondary | ICD-10-CM | POA: Diagnosis not present

## 2019-03-27 DIAGNOSIS — M810 Age-related osteoporosis without current pathological fracture: Secondary | ICD-10-CM

## 2019-03-27 DIAGNOSIS — R079 Chest pain, unspecified: Secondary | ICD-10-CM | POA: Diagnosis not present

## 2019-03-27 LAB — COMPREHENSIVE METABOLIC PANEL
ALT: 21 U/L (ref 0–44)
AST: 21 U/L (ref 15–41)
Albumin: 3.5 g/dL (ref 3.5–5.0)
Alkaline Phosphatase: 93 U/L (ref 38–126)
Anion gap: 11 (ref 5–15)
BUN: 14 mg/dL (ref 8–23)
CO2: 19 mmol/L — ABNORMAL LOW (ref 22–32)
Calcium: 9.2 mg/dL (ref 8.9–10.3)
Chloride: 108 mmol/L (ref 98–111)
Creatinine, Ser: 0.99 mg/dL (ref 0.44–1.00)
GFR calc Af Amer: >60 mL/min (ref >60)
GFR calc non Af Amer: 57 mL/min — ABNORMAL LOW (ref >60)
Glucose, Bld: 219 mg/dL — ABNORMAL HIGH (ref 70–99)
Potassium: 4 mmol/L (ref 3.5–5.1)
Sodium: 138 mmol/L (ref 135–145)
Total Bilirubin: 0.7 mg/dL (ref 0.3–1.2)
Total Protein: 6.7 g/dL (ref 6.5–8.1)

## 2019-03-27 LAB — CBC
HCT: 44.5 % (ref 36.0–46.0)
Hemoglobin: 14.8 g/dL (ref 12.0–15.0)
MCH: 31 pg (ref 26.0–34.0)
MCHC: 33.3 g/dL (ref 30.0–36.0)
MCV: 93.3 fL (ref 80.0–100.0)
Platelets: 123 10*3/uL — ABNORMAL LOW (ref 150–400)
RBC: 4.77 MIL/uL (ref 3.87–5.11)
RDW: 13.3 % (ref 11.5–15.5)
WBC: 9.6 10*3/uL (ref 4.0–10.5)
nRBC: 0 % (ref 0.0–0.2)

## 2019-03-27 MED ORDER — HYDROCODONE-ACETAMINOPHEN 5-325 MG PO TABS
1.0000 | ORAL_TABLET | Freq: Once | ORAL | Status: AC
  Administered 2019-03-27: 1 via ORAL
  Filled 2019-03-27: qty 1

## 2019-03-27 MED ORDER — MORPHINE SULFATE (PF) 4 MG/ML IV SOLN
4.0000 mg | Freq: Once | INTRAVENOUS | Status: AC
  Administered 2019-03-27: 4 mg via INTRAVENOUS
  Filled 2019-03-27: qty 1

## 2019-03-27 MED ORDER — KETOROLAC TROMETHAMINE 30 MG/ML IJ SOLN: 30.0000 mg | Freq: Once | INTRAMUSCULAR | Status: DC

## 2019-03-27 NOTE — ED Notes (Addendum)
Pt back from MRI 

## 2019-03-27 NOTE — ED Notes (Signed)
Transferred to mced by wheelchair/Queenie, CMA

## 2019-03-27 NOTE — ED Provider Notes (Signed)
[facility] Emergency Department Provider Note MRN:  [id number]  Arrival date & time: 03/27/19     Chief Complaint   fall on Tuesday/ rib/ back pain   History of Present Illness   Amber Stephenson is a 73 y.o. year-old female with a history of A. fib on Eliquis presenting to the ED with chief complaint of fall.  Patient fell 5 days ago.  She explains that she stumbled over her own feet.  Denies syncope, no chest pain, no dizziness preceding the fall.  Has been having continued pain to her left arm, left ribs, left lower back since the fall, tried to "tough it out" at home.  Sent here by urgent care today due to significance of pain.  Patient endorses chronic low back pain but this is much worse, endorses episode of bowel incontinence last night and this morning.  Described it as a "loss of control", of her bowel function.  Denies fever.  Denies head trauma, no loss of consciousness, no nausea or vomiting since the fall, no neck pain, no chest pain, no shortness of breath, no abdominal pain.  Review of Systems  A complete 10 system review of systems was obtained and all systems are negative except as noted in the HPI and PMH.   Patient's Health History    Past Medical History:  Diagnosis Date  . Atrial fibrillation with RVR 05/19/2017  . Bipolar I disorder 01/23/2015  . CAD (coronary artery disease)    Nonobstructive; Managed by Dr. Bronson Ing  . Cardiomegaly 01/12/2018  . Chronic anticoagulation 05/30/2015  . Chronic back pain    Lower back  . Chronic diastolic CHF (congestive heart failure) 05/30/2015  . Diabetes mellitus, type 2, without complication   . Diabetic neuropathy 02/06/2016  . Essential hypertension   . Fibromyalgia 03/19/2017  . Herpes genitalis in women 07/16/2015  . Hyperlipidemia   . Hypertension   . Hypoxia 02/01/2019  . Insomnia 01/23/2015  . Lipoma 02/08/2015  . Major depressive disorder   . Migraine headache with aura 02/12/2016  .  NAFL (nonalcoholic fatty liver) 123XX123  . OSA (obstructive sleep apnea) 02/24/2019   10/09/2018 - HST  - AHI 40.6   . Pulmonary hypertension   . Renal insufficiency    Managed by Dr. Wallace Keller  . RLS (restless legs syndrome) 04/27/2015    Past Surgical History:  Procedure Laterality Date  . BREAST REDUCTION SURGERY    . EYE SURGERY Right    cateracts  . HAMMER TOE SURGERY    . LEFT HEART CATH AND CORONARY ANGIOGRAPHY N/A 02/03/2018   Procedure: LEFT HEART CATH AND CORONARY ANGIOGRAPHY;  Surgeon: Martinique, Peter M, MD;  Location: [facility];  Service: Cardiovascular;  Laterality: N/A;  . SHOULDER SURGERY Right    "I BROKE MY SHOUDLER  . THIGH SURGERY     "TO REMOVE A TUMOR "    Family History  Problem Relation Age of Onset  . Diabetes Mother   . Heart disease Mother   . Heart disease Brother   . Heart disease Sister        CABG  . Alcohol abuse Sister   . Mental illness Brother   . Diabetes Brother     Social History   Socioeconomic History  . Marital status: Married    Spouse name: Orpah Greek  . Number of children: 3  . Years of education: 74  . Highest education level: Associate degree: academic program  Occupational History  .  Occupation: Retired  Scientific laboratory technician  . Financial resource strain: Not hard at all  . Food insecurity    Worry: Never true    Inability: Never true  . Transportation needs    Medical: No    Non-medical: No  Tobacco Use  . Smoking status: Never Smoker  . Smokeless tobacco: Never Used  Substance and Sexual Activity  . Alcohol use: No    Alcohol/week: 0.0 standard drinks  . Drug use: No  . Sexual activity: Yes    Partners: Male    Birth control/protection: Post-menopausal  Lifestyle  . Physical activity    Days per week: 0 days    Minutes per session: 0 min  . Stress: Not at all  Relationships  . Social connections    Talks on phone: More than three times a week    Gets together: Once a week    Attends religious service: More than 4  times per year    Active member of club or organization: No    Attends meetings of clubs or organizations: Never    Relationship status: Married  . Intimate partner violence    Fear of current or ex partner: No    Emotionally abused: No    Physically abused: No    Forced sexual activity: No  Other Topics Concern  . Not on file  Social History Narrative   Lives at home with husband.       Right handed     Physical Exam  Vital Signs and Nursing Notes reviewed Vitals:   03/27/19 1400 03/27/19 1415  BP: (!) 106/50 110/77  Pulse: 73 87  Resp: 13 18  Temp:    SpO2: 97% 98%    CONSTITUTIONAL: Well-appearing, NAD NEURO:  Alert and oriented x 3, no focal deficits EYES:  eyes equal and reactive ENT/NECK:  no LAD, no JVD CARDIO: Regular rate, well-perfused, normal S1 and S2 PULM:  CTAB no wheezing or rhonchi GI/GU:  normal bowel sounds, non-distended, non-tender MSK/SPINE:  No gross deformities, no edema, positive left snuffbox tenderness, moderate midline lumbar tenderness to palpation SKIN: Bruising to left forearm, left wrist PSYCH:  Appropriate speech and behavior  Diagnostic and Interventional Summary    Labs Reviewed  CBC - Abnormal; Notable for the following components:      Result Value   Platelets 123 (*)    All other components within normal limits  COMPREHENSIVE METABOLIC PANEL - Abnormal; Notable for the following components:   CO2 19 (*)    Glucose, Bld 219 (*)    GFR calc non Af Amer 57 (*)    All other components within normal limits    DG Wrist Complete Left  Final Result    DG Forearm Left  Final Result    DG Ribs Unilateral W/Chest Left  Final Result    MR LUMBAR SPINE WO CONTRAST    (Results Pending)    Medications - No data to display   Procedures  /  Critical Care  ED Course and Medical Decision Making  I have reviewed the triage vital signs and the nursing notes.  Pertinent labs & imaging results that were available during my care of  the patient were reviewed by me and considered in my medical decision making (see below for details).  Anticoagulated, acute on chronic back pain, bowel incontinence, will need MRI to exclude cauda equina or myelopathy.  Otherwise reassuring neurological exam.  X-rays pending as well, patient will need thumb spica splint and  Ortho follow-up regardless of x-ray result.  Awaiting MRI, imaging otherwise unremarkable.  Will place in thumb spica, attempt to ambulate prior to discharge attempt if MRI is without acute findings.  Signed out to oncoming provider at shift change.  Barth Kirks. Sedonia Small, MD [place] [email or web address]  Final Clinical Impressions(s) / ED Diagnoses     ICD-10-CM   1. Closed fracture of one rib of left side, initial encounter  S22.32XA   2. Pain of left upper extremity  M79.602     ED Discharge Orders    None      Discharge Instructions Discussed with and Provided to Patient:   Discharge Instructions     You were evaluated in the Emergency Department and after careful evaluation, we did not find any emergent condition requiring admission or further testing in the hospital.  Your exam/testing today is overall reassuring.  Please return to the Emergency Department if you experience any worsening of your condition.  We encourage you to follow up with a primary care provider.  Thank you for allowing [place] to be a part of your care.       Maudie Flakes, MD 03/27/19 1520

## 2019-03-27 NOTE — ED Provider Notes (Signed)
[place]    CSN: [id number] Arrival date & time: 03/27/19  1019      History   Chief Complaint Chief Complaint  Patient presents with  . Fall    HPI Amber Stephenson is a 73 y.o. female.   HPI  This is a complicated patient with multiple medical problems.  She has heart disease and takes Eliquis.  She has osteoporosis and is on Prolia.  She has mental illness and cognitive decline, is under the care of neurology.  COPD, pulmonary hypertension, is under the care of her PCP and pulmonology. She states that she tripped and fell in her kitchen.  This happened on 03/21/2019.  She has not obtained medical care for this until today.  She states she is here today because "she cannot stand it anymore".  She states that she has been getting around with difficulty ever since her fall.  She has extensive bruising on her left arm and left leg.  She fell towards her left and landed on her left buttock and ribs, she did not hit her head.  She has severe low back pain that is getting worse with time.  She has severe left rib pain that is worse with time.  It hurts with movement.  Hurts with breathing.  No radiation into arms or legs.  No numbness or weakness.  No loss of control of bowel or bladder.  No prior osteoporotic fracture. She is on pregabalin for pain.  She also takes lorazepam 1 mg 3 times a day.  Discussed with her that this could increase her risk of falls.  Past Medical History:  Diagnosis Date  . Atrial fibrillation with RVR 05/19/2017  . Bipolar I disorder 01/23/2015  . CAD (coronary artery disease)    Nonobstructive; Managed by Dr. Bronson Ing  . Cardiomegaly 01/12/2018  . Chronic anticoagulation 05/30/2015  . Chronic back pain    Lower back  . Chronic diastolic CHF (congestive heart failure) 05/30/2015  . Diabetes mellitus, type 2, without complication   . Diabetic neuropathy 02/06/2016  . Essential hypertension   . Fibromyalgia 03/19/2017  . Herpes genitalis in  women 07/16/2015  . Hyperlipidemia   . Hypertension   . Hypoxia 02/01/2019  . Insomnia 01/23/2015  . Lipoma 02/08/2015  . Major depressive disorder   . Migraine headache with aura 02/12/2016  . NAFL (nonalcoholic fatty liver) 123XX123  . OSA (obstructive sleep apnea) 02/24/2019   10/09/2018 - HST  - AHI 40.6   . Pulmonary hypertension   . Renal insufficiency    Managed by Dr. Wallace Keller  . RLS (restless legs syndrome) 04/27/2015    Patient Active Problem List   Diagnosis Date Noted  . OSA (obstructive sleep apnea) 02/24/2019  . Allergic rhinitis 02/24/2019  . Hypoxia 02/01/2019  . Acute on chronic respiratory failure with hypoxia ([place]) 01/22/2019  . Obesity (BMI 30-39.9) 07/06/2018  . Closed fracture of proximal end of left humerus 03/16/2018  . Fracture of four ribs of left side, closed, initial encounter 03/06/2018  . Cardiomegaly 01/12/2018  . NAFL (nonalcoholic fatty liver) 123456  . Age-related osteoporosis without current pathological fracture 12/15/2017  . Asthma 11/12/2017  . Atrial fibrillation with RVR ([place]) 05/19/2017  . Microscopic colitis 05/19/2017  . Hypotension 05/19/2017  . Fibromyalgia 03/19/2017  . Migraine headache with aura 02/12/2016  . Diabetic neuropathy ([place]) 02/06/2016  . Major depressive disorder   . Herpes genitalis in women 07/16/2015  . Chronic anticoagulation 05/30/2015  . Chronic diastolic CHF (  congestive heart failure) ([place]) 05/30/2015  . Chest pain with moderate risk of acute coronary syndrome 05/29/2015  . Dyspnea on exertion   . Essential hypertension   . RLS (restless legs syndrome) 04/27/2015  . Lipoma 02/08/2015  . Bipolar disorder ([place]) 01/23/2015  . Insomnia 01/23/2015  . Chronic back pain 01/04/2015  . Permanent atrial fibrillation 01/04/2015  . DM2 (diabetes mellitus, type 2) ([place])   . Hyperlipidemia     Past Surgical History:  Procedure Laterality Date  . BREAST REDUCTION SURGERY    . EYE SURGERY Right    cateracts  . HAMMER  TOE SURGERY    . LEFT HEART CATH AND CORONARY ANGIOGRAPHY N/A 02/03/2018   Procedure: LEFT HEART CATH AND CORONARY ANGIOGRAPHY;  Surgeon: Martinique, Peter M, MD;  Location: [facility];  Service: Cardiovascular;  Laterality: N/A;  . SHOULDER SURGERY Right    "I BROKE MY SHOUDLER  . THIGH SURGERY     "TO REMOVE A TUMOR "    OB History   No obstetric history on file.      Home Medications    Prior to Admission medications   Medication Sig Start Date End Date Taking? Authorizing Provider  acetaminophen (TYLENOL) 325 MG tablet Take 325-650 mg by mouth every 6 (six) hours as needed for mild pain, moderate pain or headache.   Yes [provider]  apixaban (ELIQUIS) 5 MG TABS tablet Take 1 tablet (5 mg total) by mouth 2 (two) times daily. 02/15/19  Yes Hawks, Christy A, FNP  diltiazem (CARDIZEM CD) 120 MG 24 hr capsule TAKE 1 CAPSULE AT BEDTIME Patient taking differently: Take 120 mg by mouth at bedtime.  11/04/18  Yes Herminio Commons, MD  ARIPiprazole (ABILIFY) 10 MG tablet Take 3 tablets (30 mg total) by mouth daily. 03/24/19   Plovsky, Berneta Sages, MD  atorvastatin (LIPITOR) 40 MG tablet TAKE 1 TABLET DAILY 02/02/19   Evelina Dun A, FNP  budesonide-formoterol (SYMBICORT) 80-4.5 MCG/ACT inhaler Inhale 2 puffs into the lungs 2 (two) times daily. Patient taking differently: Inhale 2 puffs into the lungs 2 (two) times daily as needed (shortness of breath).  03/04/18   [name], DO  carbamazepine (CARBATROL) 100 MG 12 hr capsule Take 1 capsule (100 mg total) by mouth daily. 12/23/18 12/23/19  Plovsky, Berneta Sages, MD  Cholecalciferol (VITAMIN D3) 1000 UNITS CAPS Take 1,000 Units by mouth daily.    [provider]  Dulaglutide (TRULICITY) 1.5 0000000 SOPN Inject 1.5 mg into the skin once a week. 08/20/17   Timmothy Euler, MD  Eszopiclone (ESZOPICLONE) 3 MG TABS Take 1 tablet (3 mg total) by mouth at bedtime as needed. Take immediately before bedtime Patient taking  differently: Take 3 mg by mouth at bedtime as needed (sleep). Take immediately before bedtime 12/23/18 12/23/19  Norma Fredrickson, MD  furosemide (LASIX) 40 MG tablet Take 1 tablet (40 mg total) by mouth every morning. 02/15/19   Herminio Commons, MD  Insulin Glargine (LANTUS SOLOSTAR) 100 UNIT/ML Solostar Pen INJECT 60 UNITS UNDER THE SKIN TWICE A DAY Patient taking differently: Inject 60 Units into the skin 2 (two) times daily.  01/25/18   Herminio Commons, MD  LORazepam (ATIVAN) 1 MG tablet Take 1-2 tablets (1-2 mg total) by mouth See admin instructions. Take 1 tab (1 mg) in the morning and 2 tabs (2 mg ) at bedtime 03/24/19   Plovsky, Berneta Sages, MD  metoprolol tartrate (LOPRESSOR) 100 MG tablet Take 1 tablet (100 mg total) by  mouth 2 (two) times daily. Take 100mg  + 25mg  for a total of 125mg  twice a day. 02/04/19   Johnson, Clanford L, MD  metoprolol tartrate (LOPRESSOR) 25 MG tablet Take 1 tablet (25 mg total) by mouth 2 (two) times daily. Take with metoprolol 100 mg to equal 125 mg Patient taking differently: Take 25 mg by mouth 2 (two) times daily. Take 100mg  + 25mg  for a total of 125mg  twice a day. 05/24/18   Herminio Commons, MD  naloxone [facility]) nasal spray 4 mg/0.1 mL Place 1 spray into the nose once as needed (opiod overdose).    [provider]  oxyCODONE-acetaminophen (PERCOCET/ROXICET) 5-325 MG tablet  02/28/19   [provider]  polyvinyl alcohol (LIQUIFILM TEARS) 1.4 % ophthalmic solution Place 1 drop into both eyes as needed for dry eyes.    [provider]  potassium chloride SA (K-DUR) 20 MEQ tablet Take 1 tablet (20 mEq total) by mouth daily. 02/04/19   Johnson, Clanford L, MD  predniSONE (STERAPRED UNI-PAK 21 TAB) 10 MG (21) TBPK tablet Use as directed 03/11/19   Evelina Dun A, FNP  pregabalin (LYRICA) 150 MG capsule Take 1 capsule (150 mg total) by mouth 2 (two) times daily. 03/24/19 03/23/20  Norma Fredrickson, MD  PROAIR HFA 108 [phone] Base) MCG/ACT  inhaler  02/18/19   [provider]  rizatriptan (MAXALT-MLT) 10 MG disintegrating tablet DISSOLVE 1 TABLET ON THE TONGUE AT FIRST SIGN OF HEADACHE. MAY REPEAT ONE DOSE IN 1 HOUR IF NEEDED Patient taking differently: Take 10 mg by mouth See admin instructions. DISSOLVE 1 TABLET ON THE TONGUE AT FIRST SIGN OF HEADACHE. MAY REPEAT ONE DOSE IN 1 HOUR IF NEEDED 11/23/18   Ronnie Doss M, DO  topiramate (TOPAMAX) 25 MG tablet Take 2 tablets every night Patient taking differently: Take 50 mg by mouth at bedtime. One in AM Take 2 tablets every night 01/26/19   Cameron Sprang, MD  traZODone (DESYREL) 100 MG tablet Take 4 tablets (400 mg total) by mouth at bedtime. 03/24/19   Norma Fredrickson, MD  VASCEPA 1 g CAPS TAKE 2 CAPSULES TWICE A DAY WITH MEALS 02/15/19   Sharion Balloon, FNP    Family History Family History  Problem Relation Age of Onset  . Diabetes Mother   . Heart disease Mother   . Heart disease Brother   . Heart disease Sister        CABG  . Alcohol abuse Sister   . Mental illness Brother   . Diabetes Brother     Social History Social History   Tobacco Use  . Smoking status: Never Smoker  . Smokeless tobacco: Never Used  Substance Use Topics  . Alcohol use: No    Alcohol/week: 0.0 standard drinks  . Drug use: No     Allergies   Ivp dye [iodinated diagnostic agents] and Latex   Review of Systems Review of Systems  Constitutional: Negative for chills and fever.  HENT: Negative for ear pain and sore throat.   Eyes: Negative for pain and visual disturbance.  Respiratory: Negative for cough and shortness of breath.   Cardiovascular: Negative for chest pain and palpitations.  Gastrointestinal: Negative for abdominal pain and vomiting.  Genitourinary: Negative for dysuria and hematuria.  Musculoskeletal: Positive for arthralgias, back pain and gait problem. Negative for neck pain and neck stiffness.  Skin: Positive for color change. Negative for rash.        Extensive bruising  Neurological: Negative for dizziness, seizures,  syncope and headaches.  Psychiatric/Behavioral: Positive for sleep disturbance.  All other systems reviewed and are negative.    Physical Exam Triage Vital Signs ED Triage Vitals  Enc Vitals Group     BP 03/27/19 1108 115/81     Pulse Rate 03/27/19 1108 73     Resp 03/27/19 1108 18     Temp 03/27/19 1108 97.8 F (36.6 C)     Temp Source 03/27/19 1108 Oral     SpO2 03/27/19 1108 96 %     Weight --      Height --      Head Circumference --      Peak Flow --      Pain Score 03/27/19 1106 9     Pain Loc --      Pain Edu? --      Excl. in [place]? --    No data found.  Updated Vital Signs BP 115/81 (BP Location: Right Arm)   Pulse 73   Temp 97.8 F (36.6 C) (Oral)   Resp 18   SpO2 96%      Physical Exam Constitutional:      General: She is in acute distress.     Appearance: She is well-developed. She is obese.     Comments: Patient is in wheelchair.  She repositions and groans frequently.  She appears acutely uncomfortable.  She expresses that she is unable to stand from the chair.  She has difficulty leaning forward in the chair for physical examination.  HENT:     Head: Normocephalic and atraumatic.     Mouth/Throat:     Comments: Mask in place Eyes:     Conjunctiva/sclera: Conjunctivae normal.     Pupils: Pupils are equal, round, and reactive to light.  Neck:     Musculoskeletal: Normal range of motion and neck supple. No muscular tenderness.  Cardiovascular:     Rate and Rhythm: Normal rate and regular rhythm.     Heart sounds: Normal heart sounds.  Pulmonary:     Effort: Pulmonary effort is normal. No respiratory distress.     Breath sounds: Normal breath sounds.  Chest:     Chest wall: Tenderness present.  Abdominal:     General: There is no distension.     Palpations: Abdomen is soft.  Musculoskeletal: Normal range of motion.        General: Tenderness present.       Arms:       Legs:      Comments: Extensive bruising over left forearm and left thumb.  Good grip strength.  Normal range of motion.  No bony tenderness. Extensive bruising on left lateral knee down to mid calf.  Tenderness over the lateral tibial plateau.  Full range of motion.  No instability.  Patient can weight-bear There is bruising underneath the left breast and across lower rib border in the anterior axillary line.  No crepitus.  Very tender to palpation. There is tenderness over the central sacrum and left SI area.  Mild tenderness over the L4 and 5 spinous processes.  No bruising noted on back   Skin:    General: Skin is warm and dry.     Findings: Bruising present.  Neurological:     General: No focal deficit present.     Mental Status: She is alert.  Psychiatric:        Mood and Affect: Mood normal.     Comments: Exaggerated pain behavior, appears acutely uncomfortable  UC Treatments / Results  Labs (all labs ordered are listed, but only abnormal results are displayed) Labs Reviewed - No data to display  EKG   Radiology No results found.  Procedures Procedures (including critical care time)  Medications Ordered in UC Medications - No data to display  Initial Impression / Assessment and Plan / UC Course  I have reviewed the triage vital signs and the nursing notes.  Pertinent labs & imaging results that were available during my care of the patient were reviewed by me and considered in my medical decision making (see chart for details).     I ordered x-rays of the chest and left ribs, and the lumbar spine.  I am concerned about lumbar compression fracture given her osteoporosis.  Concerned about rib fracture given her bruising and pain.  Patient expressed that she was unable to get on the x-ray table for these films.  She started shaking and crying when asked to do so.  It was felt that we were unable to provide her with the care that she needs, and she was transferred to the  emergency room. Final Clinical Impressions(s) / UC Diagnoses   Final diagnoses:  Fall, initial encounter  Traumatic injury of rib  Age related osteoporosis, unspecified pathological fracture presence  Acute midline low back pain without sciatica     Discharge Instructions     Because of the severity of your pain, we need to transfer you to the emergency room to get x-rays and additional testing   ED Prescriptions    None     PDMP not reviewed this encounter.   Raylene Everts, MD 03/27/19 [phone]

## 2019-03-27 NOTE — ED Notes (Signed)
Patient is being discharged from the [facility] and sent to the Emergency Department via wheelchair by staff. Per Lysle Morales, patient is stable but in need of higher level of care due to inability to cooperate for positioning for radiology tests.  Patient requesting to go to ed. . Patient is aware and verbalizes understanding of plan of care.  Vitals:   03/27/19 1108  BP: 115/81  Pulse: 73  Resp: 18  Temp: 97.8 F (36.6 C)  SpO2: 96%

## 2019-03-27 NOTE — ED Triage Notes (Signed)
Patient sent from [facility] for further evaluation of fall that occurred after losing her balance this past Tuesday. Denies loc. bruising noted to left arm. Complains of left sided rib pain and back pain

## 2019-03-27 NOTE — ED Provider Notes (Signed)
Patient checked out at 1500 MRI pending.  Patient's MRI without signs of cord compression or significant injury.  Chest x-ray did show concern for left rib fracture and wrist imaging is negative however given scaphoid tenderness patient placed in a left thumb spica Velcro splint.  On reevaluation patient is also having significant bruising and tenderness in the left knee.  She has full range of motion of the knee but tenderness over the fibular head.  We will do x-ray to ensure no acute fracture.  Patient has been able to walk without significant tenderness of the knee but it is most painful with bending and trying to get into bed.  Otherwise neurovascularly intact.  Patient does have pain medication at home and sees a pain specialist.  She is requesting a dose of IV pain medication here.  Otherwise she is in no acute distress and vital signs are reassuring. Knee imaging without acute fracture.  Patient placed in a knee sleeve.   Blanchie Dessert, MD 03/27/19 1753

## 2019-03-27 NOTE — ED Triage Notes (Signed)
Patient presents to Urgent Care with complaints of falling and bruising her left arm and injuring her left rib cage, left knee, and left side of her back since 10/20. Patient reports she thought she would get better but it has only gotten worse, pt has been taking tylenol for pain. Pt moaning in triage.

## 2019-03-27 NOTE — ED Notes (Signed)
Family at bedside. 

## 2019-03-27 NOTE — Discharge Instructions (Signed)
Because of the severity of your pain, we need to transfer you to the emergency room to get x-rays and additional testing

## 2019-03-27 NOTE — ED Notes (Signed)
Patient verbalizes understanding of discharge instructions. Opportunity for questioning and answers were provided. Armband removed by staff, pt discharged from ED to home via POV  

## 2019-03-27 NOTE — Discharge Instructions (Addendum)
You were evaluated in the Emergency Department and after careful evaluation, we did not find any emergent condition requiring admission or further testing in the hospital.  Your exam/testing today is overall reassuring.  MRI did not show any signs of problems to your spinal cord.  X-ray of your knee and wrist were normal but chest x-ray did show a fractured rib.  Please return to the Emergency Department if you experience any worsening of your condition.  We encourage you to follow up with a primary care provider.  Thank you for allowing [place] to be a part of your care.

## 2019-03-28 ENCOUNTER — Telehealth: Payer: Self-pay | Admitting: Cardiovascular Disease

## 2019-03-28 DIAGNOSIS — M129 Arthropathy, unspecified: Secondary | ICD-10-CM | POA: Diagnosis not present

## 2019-03-28 MED ORDER — POTASSIUM CHLORIDE CRYS ER 20 MEQ PO TBCR: 20.0000 meq | EXTENDED_RELEASE_TABLET | Freq: Every day | ORAL | 3 refills | Status: DC

## 2019-03-28 NOTE — Telephone Encounter (Signed)
Needing refill on Potassium, states she was put on it from the hospital and now she's out

## 2019-03-28 NOTE — Telephone Encounter (Signed)
Refilled potassium

## 2019-03-29 NOTE — Progress Notes (Signed)
This encounter was created in error - please disregard.

## 2019-03-30 ENCOUNTER — Encounter (HOSPITAL_COMMUNITY): Payer: Medicare Other

## 2019-04-02 ENCOUNTER — Encounter (INDEPENDENT_AMBULATORY_CARE_PROVIDER_SITE_OTHER): Payer: Self-pay

## 2019-04-07 ENCOUNTER — Encounter: Payer: Medicare Other | Admitting: Psychology

## 2019-04-11 DIAGNOSIS — H5010 Unspecified exotropia: Secondary | ICD-10-CM | POA: Diagnosis not present

## 2019-04-11 DIAGNOSIS — H532 Diplopia: Secondary | ICD-10-CM | POA: Diagnosis not present

## 2019-04-11 DIAGNOSIS — H524 Presbyopia: Secondary | ICD-10-CM | POA: Diagnosis not present

## 2019-04-14 ENCOUNTER — Other Ambulatory Visit: Payer: Self-pay | Admitting: Family Medicine

## 2019-04-18 ENCOUNTER — Institutional Professional Consult (permissible substitution): Payer: Medicare Other | Admitting: Internal Medicine

## 2019-04-18 ENCOUNTER — Telehealth: Payer: Self-pay | Admitting: Cardiovascular Disease

## 2019-04-18 NOTE — Telephone Encounter (Signed)
Pt is having dental procedure and is wanting to know when she needs to hold her blood thinner

## 2019-04-18 NOTE — Telephone Encounter (Signed)
Having molar removed

## 2019-04-18 NOTE — Telephone Encounter (Signed)
I called patient, she understands to hold am dose of eliquis the am of procedure.

## 2019-04-18 NOTE — Telephone Encounter (Signed)
Hold dose morning of procedure

## 2019-04-19 ENCOUNTER — Other Ambulatory Visit: Payer: Self-pay

## 2019-04-20 ENCOUNTER — Other Ambulatory Visit: Payer: Self-pay

## 2019-04-20 ENCOUNTER — Ambulatory Visit (INDEPENDENT_AMBULATORY_CARE_PROVIDER_SITE_OTHER): Payer: Medicare Other | Admitting: Psychology

## 2019-04-20 ENCOUNTER — Ambulatory Visit: Payer: Medicare Other | Admitting: Psychology

## 2019-04-20 DIAGNOSIS — F0150 Vascular dementia without behavioral disturbance: Secondary | ICD-10-CM | POA: Diagnosis not present

## 2019-04-20 DIAGNOSIS — F01A0 Vascular dementia, mild, without behavioral disturbance, psychotic disturbance, mood disturbance, and anxiety: Secondary | ICD-10-CM

## 2019-04-20 DIAGNOSIS — R413 Other amnesia: Secondary | ICD-10-CM

## 2019-04-20 NOTE — Progress Notes (Signed)
   Neuropsychology Note   Amber Stephenson completed 100 minutes of neuropsychological testing with technician, Milana Kidney, B.S., under the supervision of Dr. Christia Reading, Ph.D., licensed neuropsychologist. The patient did not appear overtly distressed by the testing session, per behavioral observation or via self-report to the technician. Rest breaks were offered.    In considering the patient's current level of functioning, level of presumed impairment, nature of symptoms, emotional and behavioral responses during the interview, level of literacy, and observed level of motivation/effort, a battery of tests was selected and communicated to the psychometrician.   Communication between the psychologist and technician was ongoing throughout the testing session and changes were made as deemed necessary based on patient performance on testing, technician observations and additional pertinent factors such as those listed above.   Amber Stephenson will return within approximately two weeks for an interactive feedback session with Dr. Melvyn Novas at which time his test performances, clinical impressions, and treatment recommendations will be reviewed in detail. The patient understands she can contact our office should she require our assistance before this time.   Full report to follow.  100 minutes were spent face-to-face with patient administering standardized tests and 15 minutes were spent scoring (technician). [CPT [id number], [id number]

## 2019-04-20 NOTE — Progress Notes (Addendum)
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 Limits       NAB Spatial Module, Form 1: T Score Percentile     Figure Drawing Copy 50 50 Average        Scaled Score Percentile   WAIS-IV Matrix Reasoning: 9 37 Average       Mood and Personality:      Raw Score Percentile   Geriatric Depression Scale: 14 --- Mild  Geriatric Anxiety Scale: 16 --- Mild    Somatic 5 --- Minimal    Cognitive 6 --- Mild    Affective 5 --- Mild       Additional Questionnaires:      Raw Score Percentile   PROMIS Sleep Disturbance Questionnaire: 21 --- None to Slight   Informed Consent and Coding/Compliance:   Ms. Mortland was provided with a verbal description of the nature and purpose of the present neuropsychological evaluation. Also reviewed were the foreseeable risks and/or discomforts and benefits of the procedure, limits of confidentiality, and mandatory reporting requirements of this provider. The patient was given the opportunity to ask questions  and receive answers about the evaluation. Oral consent to participate was provided by the patient.   This evaluation was conducted by Christia Reading, Ph.D., licensed  clinical neuropsychologist. Ms. Norberg completed 115 minutes of cognitive testing, billed as one unit [phone] and three additional units [phone]. Psychometrist Milana Kidney, B.S., assisted Dr. Melvyn Novas with test administration and scoring procedures. As a separate and discrete service, Dr. Melvyn Novas spent a total of 120 minutes in interpretation and report writing, billed as one unit [phone] and one unit [phone].

## 2019-04-21 ENCOUNTER — Encounter: Payer: Self-pay | Admitting: Psychology

## 2019-04-21 DIAGNOSIS — F0150 Vascular dementia without behavioral disturbance: Secondary | ICD-10-CM

## 2019-04-21 DIAGNOSIS — F0670 Mild neurocognitive disorder due to known physiological condition without behavioral disturbance: Secondary | ICD-10-CM | POA: Insufficient documentation

## 2019-04-21 DIAGNOSIS — F01A0 Vascular dementia, mild, without behavioral disturbance, psychotic disturbance, mood disturbance, and anxiety: Secondary | ICD-10-CM

## 2019-04-21 DIAGNOSIS — G3184 Mild cognitive impairment, so stated: Secondary | ICD-10-CM | POA: Insufficient documentation

## 2019-04-21 DIAGNOSIS — I999 Unspecified disorder of circulatory system: Secondary | ICD-10-CM | POA: Insufficient documentation

## 2019-04-21 HISTORY — DX: Vascular dementia, mild, without behavioral disturbance, psychotic disturbance, mood disturbance, and anxiety: F01.A0

## 2019-04-21 HISTORY — DX: Vascular dementia without behavioral disturbance: F01.50

## 2019-04-22 ENCOUNTER — Other Ambulatory Visit: Payer: Self-pay

## 2019-04-22 ENCOUNTER — Ambulatory Visit (INDEPENDENT_AMBULATORY_CARE_PROVIDER_SITE_OTHER): Payer: Medicare Other

## 2019-04-22 DIAGNOSIS — M810 Age-related osteoporosis without current pathological fracture: Secondary | ICD-10-CM

## 2019-04-22 MED ORDER — DENOSUMAB 60 MG/ML ~~LOC~~ SOSY
60.0000 mg | PREFILLED_SYRINGE | Freq: Once | SUBCUTANEOUS | Status: AC
  Administered 2019-04-22: 60 mg via SUBCUTANEOUS

## 2019-04-22 NOTE — Progress Notes (Signed)
Prolia injection given to left arm.  Patient tolerated well.  Buy and bill 

## 2019-04-25 ENCOUNTER — Ambulatory Visit (INDEPENDENT_AMBULATORY_CARE_PROVIDER_SITE_OTHER): Payer: Medicare Other | Admitting: Family Medicine

## 2019-04-25 ENCOUNTER — Other Ambulatory Visit: Payer: Self-pay

## 2019-04-25 ENCOUNTER — Encounter: Payer: Self-pay | Admitting: Family Medicine

## 2019-04-25 ENCOUNTER — Other Ambulatory Visit: Payer: Self-pay | Admitting: Cardiovascular Disease

## 2019-04-25 VITALS — BP 119/77 | HR 62 | Temp 96.8°F | Resp 20 | Ht 62.0 in | Wt 199.0 lb

## 2019-04-25 DIAGNOSIS — R42 Dizziness and giddiness: Secondary | ICD-10-CM | POA: Diagnosis not present

## 2019-04-25 DIAGNOSIS — W19XXXD Unspecified fall, subsequent encounter: Secondary | ICD-10-CM | POA: Diagnosis not present

## 2019-04-25 DIAGNOSIS — S2002XD Contusion of left breast, subsequent encounter: Secondary | ICD-10-CM

## 2019-04-25 DIAGNOSIS — Z8742 Personal history of other diseases of the female genital tract: Secondary | ICD-10-CM

## 2019-04-25 DIAGNOSIS — S5012XD Contusion of left forearm, subsequent encounter: Secondary | ICD-10-CM

## 2019-04-25 HISTORY — DX: Dizziness and giddiness: R42

## 2019-04-25 MED ORDER — MECLIZINE HCL 12.5 MG PO TABS: 12.5000 mg | ORAL_TABLET | Freq: Three times a day (TID) | ORAL | 0 refills | Status: DC | PRN

## 2019-04-25 MED ORDER — FLUCONAZOLE 150 MG PO TABS: 150.0000 mg | ORAL_TABLET | Freq: Once | ORAL | 1 refills | Status: DC

## 2019-04-25 NOTE — Progress Notes (Signed)
Subjective:  Patient ID: Amber Stephenson, female    DOB: 04-19-1946, 73 y.o.   MRN: [id number]  Patient Care Team: Baruch Gouty, FNP as PCP - General (Family Medicine) Herminio Commons, MD as PCP - Cardiology (Cardiology) Weber Cooks, MD as Referring Physician (Orthopedic Surgery) Norma Fredrickson, MD as Consulting Physician (Psychiatry) Susette Racer, MD (Endocrinology)   Chief Complaint:  Fall (about 1 mo ago - went to [facility] ER - still having some issues )   HPI: Amber Stephenson is a 73 y.o. female presenting on 04/25/2019 for Fall (about 1 mo ago - went to [facility] ER - still having some issues )   Pt presents today for follow up after fall on 03/21/2019. Pt was evaluated in the ED on 03/27/2019 for her injuries from the fall. She was found to have a left 7th rib fracture. All other imaging was negative. Pt states she has a "knot" on the vein of her left forearm and is concerned about it. States she continues to have tenderness to her left breast. She denies new injury. No shortness of breath, chest pain, palpitations, or syncope. States she does have dizziness. States this been ongoing for over a year and is intermittent. States the dizziness can be worse with positional changes. No syncope, visual changes, or confusion. States she is followed by neurology for her mild cognitive impairment but has not mentioned the dizziness to them. She denies slurred speech, facial droop, unilateral weakness, or numbness/tingling. No focal deficits.     Relevant past medical, surgical, family, and social history reviewed and updated as indicated.  Allergies and medications reviewed and updated. Date reviewed: Chart in Epic.   Past Medical History:  Diagnosis Date  . Atrial fibrillation with RVR 05/19/2017  . Bipolar I disorder 01/23/2015  . CAD (coronary artery disease)    Nonobstructive; Managed by Dr. Bronson Ing  . Cardiomegaly 01/12/2018  . Chronic anticoagulation 05/30/2015  .  Chronic back pain    Lower back  . Chronic diastolic CHF (congestive heart failure) 05/30/2015  . Diabetes mellitus, type 2, without complication   . Diabetic neuropathy 02/06/2016  . Essential hypertension   . Fibromyalgia 03/19/2017  . Herpes genitalis in women 07/16/2015  . Hyperlipidemia   . Hypertension   . Hypoxia 02/01/2019  . Insomnia 01/23/2015  . Lipoma 02/08/2015  . Major depressive disorder   . Migraine headache with aura 02/12/2016  . 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 NAFL (nonalcoholic fatty liver) [id number]  . OSA (obstructive sleep apnea) 02/24/2019   10/09/2018 - HST  - AHI 40.6   . Pulmonary hypertension   . Renal insufficiency    Managed by Dr. Wallace Keller  . RLS (restless legs syndrome) 04/27/2015    Past Surgical History:  Procedure Laterality Date  . BREAST REDUCTION SURGERY    . EYE SURGERY Right    cateracts  . HAMMER TOE SURGERY    . LEFT HEART CATH AND CORONARY ANGIOGRAPHY N/A 02/03/2018   Procedure: LEFT HEART CATH AND CORONARY ANGIOGRAPHY;  Surgeon: Martinique, Peter M, MD;  Location: [facility];  Service: Cardiovascular;  Laterality: N/A;  . SHOULDER SURGERY Right    "I BROKE MY SHOUDLER  . THIGH SURGERY     "TO REMOVE A TUMOR "    Social History   Socioeconomic History  . Marital status: Married    Spouse name: Orpah Greek  . Number of children: 3  . Years of education: 57  .  Highest education level: Associate degree: academic program  Occupational History  . Occupation: Retired  Scientific laboratory technician  . Financial resource strain: Not hard at all  . Food insecurity    Worry: Never true    Inability: Never true  . Transportation needs    Medical: No    Non-medical: No  Tobacco Use  . Smoking status: Never Smoker  . Smokeless tobacco: Never Used  Substance and Sexual Activity  . Alcohol use: No    Alcohol/week: 0.0 standard drinks  . Drug use: No  . Sexual activity: Yes    Partners: Male    Birth control/protection:  Post-menopausal  Lifestyle  . Physical activity    Days per week: 0 days    Minutes per session: 0 min  . Stress: Not at all  Relationships  . Social connections    Talks on phone: More than three times a week    Gets together: Once a week    Attends religious service: More than 4 times per year    Active member of club or organization: No    Attends meetings of clubs or organizations: Never    Relationship status: Married  . Intimate partner violence    Fear of current or ex partner: No    Emotionally abused: No    Physically abused: No    Forced sexual activity: No  Other Topics Concern  . Not on file  Social History Narrative   Lives at home with husband.       Right handed    Outpatient Encounter Medications as of 04/25/2019  Medication Sig  . acetaminophen (TYLENOL) 325 MG tablet Take 325-650 mg by mouth every 6 (six) hours as needed for mild pain, moderate pain or headache.  Marland Kitchen apixaban (ELIQUIS) 5 MG TABS tablet Take 1 tablet (5 mg total) by mouth 2 (two) times daily.  . ARIPiprazole (ABILIFY) 10 MG tablet Take 3 tablets (30 mg total) by mouth daily. (Patient taking differently: Take 10 mg by mouth 2 (two) times daily. )  . atorvastatin (LIPITOR) 40 MG tablet TAKE 1 TABLET DAILY  . budesonide-formoterol (SYMBICORT) 80-4.5 MCG/ACT inhaler Inhale 2 puffs into the lungs 2 (two) times daily. (Patient taking differently: Inhale 2 puffs into the lungs 2 (two) times daily as needed (shortness of breath). )  . carbamazepine (CARBATROL) 100 MG 12 hr capsule Take 1 capsule (100 mg total) by mouth daily.  . Cholecalciferol (VITAMIN D3) 1000 UNITS CAPS Take 1,000 Units by mouth daily.  Marland Kitchen diltiazem (CARDIZEM CD) 120 MG 24 hr capsule TAKE 1 CAPSULE AT BEDTIME (Patient taking differently: Take 120 mg by mouth at bedtime. )  . Dulaglutide (TRULICITY) 1.5 0000000 SOPN Inject 1.5 mg into the skin once a week.  . Eszopiclone (ESZOPICLONE) 3 MG TABS Take 1 tablet (3 mg total) by mouth at  bedtime as needed. Take immediately before bedtime (Patient taking differently: Take 3 mg by mouth at bedtime as needed (sleep). Take immediately before bedtime)  . furosemide (LASIX) 40 MG tablet Take 1 tablet (40 mg total) by mouth every morning.  Marland Kitchen ibuprofen (ADVIL) 800 MG tablet TAKE 1 TABLET BY MOUTH EVERY 8 HOURS AS NEEDED( USE SPARINGLY WITH BEING ON ELIQUIS)  . Insulin Glargine (LANTUS SOLOSTAR) 100 UNIT/ML Solostar Pen INJECT 60 UNITS UNDER THE SKIN TWICE A DAY (Patient taking differently: Inject 60 Units into the skin 2 (two) times daily. )  . LORazepam (ATIVAN) 1 MG tablet Take 1-2 tablets (1-2 mg total) by mouth  See admin instructions. Take 1 tab (1 mg) in the morning and 2 tabs (2 mg ) at bedtime  . metoprolol tartrate (LOPRESSOR) 100 MG tablet Take 1 tablet (100 mg total) by mouth 2 (two) times daily. Take 100mg  + 25mg  for a total of 125mg  twice a day.  . metoprolol tartrate (LOPRESSOR) 25 MG tablet TAKE 1 TABLET TWICE A DAY (TO BE TAKEN ALONG WITH THE LOPRESSOR 100 MG TWICE A DAY)  . oxyCODONE-acetaminophen (PERCOCET/ROXICET) 5-325 MG tablet   . polyvinyl alcohol (LIQUIFILM TEARS) 1.4 % ophthalmic solution Place 1 drop into both eyes as needed for dry eyes.  . potassium chloride SA (KLOR-CON) 20 MEQ tablet Take 1 tablet (20 mEq total) by mouth daily.  . pregabalin (LYRICA) 150 MG capsule Take 1 capsule (150 mg total) by mouth 2 (two) times daily.  Marland Kitchen PROAIR HFA 108 (90 Base) MCG/ACT inhaler   . rizatriptan (MAXALT-MLT) 10 MG disintegrating tablet DISSOLVE 1 TABLET ON THE TONGUE AT FIRST SIGN OF HEADACHE. MAY REPEAT ONE DOSE IN 1 HOUR IF NEEDED (Patient taking differently: Take 10 mg by mouth See admin instructions. DISSOLVE 1 TABLET ON THE TONGUE AT FIRST SIGN OF HEADACHE. MAY REPEAT ONE DOSE IN 1 HOUR IF NEEDED)  . topiramate (TOPAMAX) 25 MG tablet Take 2 tablets every night (Patient taking differently: Take 50 mg by mouth at bedtime. One in AM Take 2 tablets every night)  . traZODone  (DESYREL) 100 MG tablet Take 4 tablets (400 mg total) by mouth at bedtime.  Marland Kitchen VASCEPA 1 g CAPS TAKE 2 CAPSULES TWICE A DAY WITH MEALS  . fluconazole (DIFLUCAN) 150 MG tablet Take 1 tablet (150 mg total) by mouth once for 1 dose.  . naloxone (NARCAN) nasal spray 4 mg/0.1 mL Place 1 spray into the nose once as needed (opiod overdose).  . [DISCONTINUED] meclizine (ANTIVERT) 12.5 MG tablet Take 1 tablet (12.5 mg total) by mouth 3 (three) times daily as needed for dizziness.   Facility-Administered Encounter Medications as of 04/25/2019  Medication  . bupivacaine-epinephrine (MARCAINE W/ EPI) 0.5% -1:200000 injection    Allergies  Allergen Reactions  . Ivp Dye [Iodinated Diagnostic Agents] Anaphylaxis and Swelling    Throat closes  . Latex     Latex tape pulls skin with it    Review of Systems  Constitutional: Negative for activity change, appetite change, chills, diaphoresis, fatigue, fever and unexpected weight change.  HENT: Negative.   Eyes: Negative.  Negative for photophobia and visual disturbance.  Respiratory: Negative for cough, chest tightness and shortness of breath.   Cardiovascular: Negative for chest pain, palpitations and leg swelling.  Gastrointestinal: Negative for abdominal distention, abdominal pain, blood in stool, constipation, diarrhea, nausea and vomiting.  Endocrine: Negative.   Genitourinary: Negative for decreased urine volume, difficulty urinating, dysuria, frequency and urgency.  Musculoskeletal: Positive for back pain (chronic) and myalgias. Negative for arthralgias, neck pain and neck stiffness.  Skin: Negative.   Allergic/Immunologic: Negative.   Neurological: Positive for dizziness. Negative for tremors, seizures, syncope, facial asymmetry, speech difficulty, weakness, light-headedness, numbness and headaches.  Hematological: Negative.   Psychiatric/Behavioral: Positive for confusion (baseline). Negative for hallucinations, sleep disturbance and suicidal  ideas.  All other systems reviewed and are negative.       Objective:  BP 119/77   Pulse 62   Temp (!) 96.8 F (36 C)   Resp 20   Ht 5\' 2"  (1.575 m)   Wt 199 lb (90.3 kg)   SpO2 95%   BMI  36.40 kg/m    Wt Readings from Last 3 Encounters:  04/25/19 199 lb (90.3 kg)  03/15/19 196 lb 12.8 oz (89.3 kg)  03/04/19 204 lb 12.8 oz (92.9 kg)    Physical Exam Vitals signs and nursing note reviewed.  Constitutional:      General: She is not in acute distress.    Appearance: Normal appearance. She is well-developed and well-groomed. She is morbidly obese. She is not ill-appearing, toxic-appearing or diaphoretic.  HENT:     Head: Normocephalic and atraumatic.     Jaw: There is normal jaw occlusion.     Right Ear: Hearing normal.     Left Ear: Hearing normal.     Nose: Nose normal.     Mouth/Throat:     Lips: Pink.     Mouth: Mucous membranes are moist.     Pharynx: Oropharynx is clear. Uvula midline.  Eyes:     General: Lids are normal.     Extraocular Movements: Extraocular movements intact.     Conjunctiva/sclera: Conjunctivae normal.     Pupils: Pupils are equal, round, and reactive to light.  Neck:     Musculoskeletal: Normal range of motion and neck supple.     Thyroid: No thyroid mass, thyromegaly or thyroid tenderness.     Vascular: No carotid bruit or JVD.     Trachea: Trachea and phonation normal.  Cardiovascular:     Rate and Rhythm: Normal rate and regular rhythm.     Chest Wall: PMI is not displaced.     Pulses: Normal pulses.     Heart sounds: Normal heart sounds. No murmur. No friction rub. No gallop.   Pulmonary:     Effort: Pulmonary effort is normal. No respiratory distress.     Breath sounds: Normal breath sounds. No decreased breath sounds or wheezing.  Chest:     Chest wall: Tenderness present. No mass, lacerations, deformity, swelling, crepitus or edema. There is no dullness to percussion.     Breasts: Breasts are symmetrical.        Right: Normal.         Left: Tenderness present. No swelling, bleeding, inverted nipple, mass, nipple discharge or skin change.    Abdominal:     General: Bowel sounds are normal. There is no distension or abdominal bruit.     Palpations: Abdomen is soft. There is no hepatomegaly or splenomegaly.     Tenderness: There is no abdominal tenderness. There is no right CVA tenderness or left CVA tenderness.     Hernia: No hernia is present.  Musculoskeletal: Normal range of motion.     Left elbow: Normal.     Left forearm: She exhibits tenderness. She exhibits no bony tenderness, no swelling, no edema, no deformity and no laceration.       Arms:     Left hand: Normal.     Right lower leg: No edema.     Left lower leg: No edema.  Lymphadenopathy:     Cervical: No cervical adenopathy.     Upper Body:     Right upper body: No supraclavicular, axillary or pectoral adenopathy.     Left upper body: No supraclavicular, axillary or pectoral adenopathy.  Skin:    General: Skin is warm and dry.     Capillary Refill: Capillary refill takes less than 2 seconds.     Coloration: Skin is not cyanotic, jaundiced or pale.     Findings: No rash.  Neurological:     General:  No focal deficit present.     Mental Status: She is alert and oriented to person, place, and time. Mental status is at baseline.     Cranial Nerves: Cranial nerves are intact. No cranial nerve deficit.     Sensory: Sensation is intact. No sensory deficit.     Motor: Motor function is intact. No weakness.     Coordination: Coordination is intact. Romberg sign negative. Coordination normal.     Gait: Gait abnormal (slow, antalgic).     Deep Tendon Reflexes: Reflexes are normal and symmetric. Reflexes normal.  Psychiatric:        Attention and Perception: Attention and perception normal.        Mood and Affect: Mood and affect normal.        Speech: Speech normal.        Behavior: Behavior normal. Behavior is cooperative.        Thought Content:  Thought content normal.        Cognition and Memory: Cognition normal. Memory is impaired (baseline).        Judgment: Judgment normal.     Results for orders placed or performed during the hospital encounter of 03/27/19  CBC  Result Value Ref Range   WBC 9.6 4.0 - 10.5 K/uL   RBC 4.77 3.87 - 5.11 MIL/uL   Hemoglobin 14.8 12.0 - 15.0 g/dL   HCT 44.5 36.0 - 46.0 %   MCV 93.3 80.0 - 100.0 fL   MCH 31.0 26.0 - 34.0 pg   MCHC 33.3 30.0 - 36.0 g/dL   RDW 13.3 11.5 - 15.5 %   Platelets 123 (L) 150 - 400 K/uL   nRBC 0.0 0.0 - 0.2 %  Comprehensive metabolic panel  Result Value Ref Range   Sodium 138 135 - 145 mmol/L   Potassium 4.0 3.5 - 5.1 mmol/L   Chloride 108 98 - 111 mmol/L   CO2 19 (L) 22 - 32 mmol/L   Glucose, Bld 219 (H) 70 - 99 mg/dL   BUN 14 8 - 23 mg/dL   Creatinine, Ser 0.99 0.44 - 1.00 mg/dL   Calcium 9.2 8.9 - 10.3 mg/dL   Total Protein 6.7 6.5 - 8.1 g/dL   Albumin 3.5 3.5 - 5.0 g/dL   AST 21 15 - 41 U/L   ALT 21 0 - 44 U/L   Alkaline Phosphatase 93 38 - 126 U/L   Total Bilirubin 0.7 0.3 - 1.2 mg/dL   GFR calc non Af Amer 57 (L) >60 mL/min   GFR calc Af Amer >60 >60 mL/min   Anion gap 11 5 - 15       Pertinent labs & imaging results that were available during my care of the patient were reviewed by me and considered in my medical decision making.  Assessment & Plan:  Ebelin was seen today for fall.  Diagnoses and all orders for this visit:  Fall, subsequent encounter Contusion of left breast, subsequent encounter Traumatic hematoma of left forearm, subsequent encounter Pt is healing well from fall. No new injuries. Discussed at length the healing process and symptoms that required follow up for reevaluation. Symptomatic care discussed in detail. Report any new or worsening symptoms.   Vertigo Ongoing for over a year. No red flags concerning for acute neurovascular event. Pt will make follow up with neurology if symptoms persist. Slow positional changes.  Report any new, worsening, or persistent symptoms.   History of vulvovaginitis -     fluconazole (DIFLUCAN) 150  MG tablet; Take 1 tablet (150 mg total) by mouth once for 1 dose.     Continue all other maintenance medications.  Follow up plan: Return if symptoms worsen or fail to improve.  Continue healthy lifestyle choices, including diet (rich in fruits, vegetables, and lean proteins, and low in salt and simple carbohydrates) and exercise (at least 30 minutes of moderate physical activity daily).  Educational handout given for muscle pain  The above assessment and management plan was discussed with the patient. The patient verbalized understanding of and has agreed to the management plan. Patient is aware to call the clinic if they develop any new symptoms or if symptoms persist or worsen. Patient is aware when to return to the clinic for a follow-up visit. Patient educated on when it is appropriate to go to the emergency department.   Monia Pouch, FNP-C [facility] [phone]

## 2019-04-25 NOTE — Patient Instructions (Signed)
Muscle Pain, Adult Muscle pain (myalgia) may be mild or severe. In most cases, the pain lasts only a short time and it goes away without treatment. It is normal to feel some muscle pain after starting a workout program. Muscles that have not been used often will be sore at first. Muscle pain may also be caused by many other things, including:  Overuse or muscle strain, especially if you are not in shape. This is the most common cause of muscle pain.  Injury.  Bruises.  Viruses, such as the flu.  Infectious diseases.  A chronic condition that causes muscle tenderness, fatigue, and headache (fibromyalgia).  A condition, such as lupus, in which the body's disease-fighting system attacks other organs in the body (autoimmune or rheumatologic diseases).  Certain drugs, including ACE inhibitors and statins. To diagnose the cause of your muscle pain, your health care provider will do a physical exam and ask questions about the pain and when it began. If you have not had muscle pain for very long, your health care provider may want to wait before doing much testing. If your muscle pain has lasted a long time, your health care provider may want to run tests right away. In some cases, this may include tests to rule out certain conditions or illnesses. Treatment for muscle pain depends on the cause. Home care is often enough to relieve muscle pain. Your health care provider may also prescribe anti-inflammatory medicine. Follow these instructions at home: Activity  If overuse is causing your muscle pain: ? Slow down your activities until the pain goes away. ? Do regular, gentle exercises if you are not usually active. ? Warm up before exercising. Stretch before and after exercising. This can help lower the risk of muscle pain.  Do not continue working out if the pain is very bad. Bad pain could mean that you have injured a muscle. Managing pain and discomfort   If directed, apply ice to the sore  muscle: ? Put ice in a plastic bag. ? Place a towel between your skin and the bag. ? Leave the ice on for 20 minutes, 2-3 times a day.  You may also alternate between applying ice and applying heat as told by your health care provider. To apply heat, use the heat source that your health care provider recommends, such as a moist heat pack or a heating pad. ? Place a towel between your skin and the heat source. ? Leave the heat on for 20-30 minutes. ? Remove the heat if your skin turns bright red. This is especially important if you are unable to feel pain, heat, or cold. You may have a greater risk of getting burned. Medicines  Take over-the-counter and prescription medicines only as told by your health care provider.  Do not drive or use heavy machinery while taking prescription pain medicine. Contact a health care provider if:  Your muscle pain gets worse and medicines do not help.  You have muscle pain that lasts longer than 3 days.  You have a rash or fever along with muscle pain.  You have muscle pain after a tick bite.  You have muscle pain while working out, even though you are in good physical condition.  You have redness, soreness, or swelling along with muscle pain.  You have muscle pain after starting a new medicine or changing the dose of a medicine. Get help right away if:  You have trouble breathing.  You have trouble swallowing.  You have muscle   pain along with a stiff neck, fever, and vomiting.  You have severe muscle weakness or cannot move part of your body. This information is not intended to replace advice given to you by your health care provider. Make sure you discuss any questions you have with your health care provider. Document Released: 04/10/2006 Document Revised: 05/01/2017 Document Reviewed: 10/09/2015 Elsevier Patient Education  2020 [employer] Inc[employer]  

## 2019-04-26 DIAGNOSIS — M5136 Other intervertebral disc degeneration, lumbar region: Secondary | ICD-10-CM | POA: Diagnosis not present

## 2019-04-26 DIAGNOSIS — M545 Low back pain: Secondary | ICD-10-CM | POA: Diagnosis not present

## 2019-04-26 DIAGNOSIS — Z79899 Other long term (current) drug therapy: Secondary | ICD-10-CM | POA: Diagnosis not present

## 2019-04-26 DIAGNOSIS — M419 Scoliosis, unspecified: Secondary | ICD-10-CM | POA: Diagnosis not present

## 2019-04-26 DIAGNOSIS — S32010A Wedge compression fracture of first lumbar vertebra, initial encounter for closed fracture: Secondary | ICD-10-CM | POA: Diagnosis not present

## 2019-05-03 ENCOUNTER — Institutional Professional Consult (permissible substitution): Payer: Medicare Other | Admitting: Pulmonary Disease

## 2019-05-03 DIAGNOSIS — M25511 Pain in right shoulder: Secondary | ICD-10-CM | POA: Diagnosis not present

## 2019-05-04 ENCOUNTER — Encounter: Payer: Self-pay | Admitting: Psychology

## 2019-05-04 ENCOUNTER — Ambulatory Visit (INDEPENDENT_AMBULATORY_CARE_PROVIDER_SITE_OTHER): Payer: Medicare Other | Admitting: Psychology

## 2019-05-04 ENCOUNTER — Other Ambulatory Visit: Payer: Self-pay

## 2019-05-04 DIAGNOSIS — F01A0 Vascular dementia, mild, without behavioral disturbance, psychotic disturbance, mood disturbance, and anxiety: Secondary | ICD-10-CM

## 2019-05-04 DIAGNOSIS — F0150 Vascular dementia without behavioral disturbance: Secondary | ICD-10-CM

## 2019-05-04 NOTE — Progress Notes (Signed)
   Neuropsychology Feedback Session Amber Stephenson. [facility] Department of Neurology  Reason for Referral:   Amber Stephenson a73 y.o.Caucasianfemalereferred by Ellouise Newer, M.D.,to characterizehercurrent cognitive functioning and assist with diagnostic clarity and treatment planning in the context ofsubjective cognitive decline, bipolar I disorder, and numerous medical comorbidities, including several cardiovascular in nature.  Feedback:   Amber Stephenson completed a comprehensive neuropsychological evaluation on 04/20/2019. Please refer to that encounter for the full report and recommendations. Briefly, results suggested primary areas of impairment surrounding processing speed and attention/concentration. Deficits in these areas likely impact other areas of weakness, including verbal fluency, some aspects of executive functioning (i.e., those tests with a timed component), and encoding (i.e., learning) of visual information. This pattern of cognitive weaknesses is very consistent with Amber Stephenson history of several cardiovascular ailments and prior neuroimaging suggesting periventricular and subcorticalT2hyperintensities moderately advanced for her age. Overall, Amber Stephenson meets criteria for a mild vascular neurocognitive disorder (formerly "mild cognitive impairment") at the present time. Testing results and behavioral characteristics are inconsistent with Alzheimer's disease or other forms of dementia presently.   Amber Stephenson was accompanied by her husband on the current phone call. Content of the current session focused on the results of the evaluation. Amber Stephenson and her husband were given the opportunity to ask questions and their questions were answered. They were also encouraged to reach out should additional questions arise. A copy of her report was mailed at the conclusion of the visit.      A total of 25 minutes were spent with Amber Stephenson and her  husband during the current feedback session.

## 2019-05-10 ENCOUNTER — Ambulatory Visit: Payer: Medicare Other | Admitting: Student

## 2019-05-11 ENCOUNTER — Ambulatory Visit (INDEPENDENT_AMBULATORY_CARE_PROVIDER_SITE_OTHER): Payer: Medicare Other | Admitting: Family Medicine

## 2019-05-11 ENCOUNTER — Encounter (HOSPITAL_COMMUNITY): Payer: Self-pay | Admitting: Emergency Medicine

## 2019-05-11 ENCOUNTER — Ambulatory Visit: Payer: Medicare Other | Admitting: Family Medicine

## 2019-05-11 ENCOUNTER — Other Ambulatory Visit: Payer: Self-pay

## 2019-05-11 ENCOUNTER — Emergency Department (HOSPITAL_COMMUNITY)
Admission: EM | Admit: 2019-05-11 | Discharge: 2019-05-11 | Disposition: A | Discharge status: Home / Self Care | Payer: Medicare Other | Attending: Emergency Medicine | Admitting: Emergency Medicine

## 2019-05-11 VITALS — BP 76/54 | HR 60 | Temp 98.4°F | Ht 62.0 in | Wt 201.4 lb

## 2019-05-11 DIAGNOSIS — E861 Hypovolemia: Secondary | ICD-10-CM

## 2019-05-11 DIAGNOSIS — I9589 Other hypotension: Secondary | ICD-10-CM | POA: Diagnosis not present

## 2019-05-11 DIAGNOSIS — R42 Dizziness and giddiness: Secondary | ICD-10-CM | POA: Diagnosis not present

## 2019-05-11 DIAGNOSIS — Z794 Long term (current) use of insulin: Secondary | ICD-10-CM | POA: Diagnosis not present

## 2019-05-11 DIAGNOSIS — I5032 Chronic diastolic (congestive) heart failure: Secondary | ICD-10-CM | POA: Diagnosis not present

## 2019-05-11 DIAGNOSIS — E1140 Type 2 diabetes mellitus with diabetic neuropathy, unspecified: Secondary | ICD-10-CM | POA: Diagnosis not present

## 2019-05-11 DIAGNOSIS — Z79899 Other long term (current) drug therapy: Secondary | ICD-10-CM | POA: Insufficient documentation

## 2019-05-11 DIAGNOSIS — Z7901 Long term (current) use of anticoagulants: Secondary | ICD-10-CM | POA: Insufficient documentation

## 2019-05-11 DIAGNOSIS — I110 Hypertensive heart disease with heart failure: Secondary | ICD-10-CM | POA: Insufficient documentation

## 2019-05-11 NOTE — Progress Notes (Signed)
Subjective:  Patient ID: Amber Stephenson, female    DOB: [birth year]  Age: 73 y.o. MRN: [id number]  CC: Numbness (right thumb started going up arm. started this morning )   HPI Amber Stephenson presents for thumb pain originally, but was so weak on arrival that she couldn't walk by herself. Nurse had to support her. Achy all over. Feeling faint. Onset today. Increasing through the day.   Depression screen Amber Stephenson  Decreased Interest 0 0 0  Down, Depressed, Hopeless 0 0 0  PHQ - 2 Score 0 0 0  Altered sleeping - 0 -  Tired, decreased energy - 0 -  Change in appetite - 0 -  Feeling bad or failure about yourself  - 0 -  Trouble concentrating - 0 -  Moving slowly or fidgety/restless - 0 -  Suicidal thoughts - 0 -  PHQ-9 Score - 0 -  Difficult doing work/chores - - -  Some recent data might be hidden    History Amber Stephenson has a past medical history of Atrial fibrillation with RVR (05/19/2017), Bipolar I disorder (01/23/2015), CAD (coronary artery disease), Cardiomegaly (01/12/2018), Chronic anticoagulation (05/30/2015), Chronic back pain, Chronic diastolic CHF (congestive heart failure) (05/30/2015), Diabetes mellitus, type 2, without complication, Diabetic neuropathy (02/06/2016), Essential hypertension, Fibromyalgia (03/19/2017), Herpes genitalis in women (07/16/2015), Hyperlipidemia, Hypertension, Hypoxia ([place]), Insomnia (01/23/2015), Lipoma (02/08/2015), Major depressive disorder, Migraine headache with aura (02/12/2016), 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[place]) (04/21/2019), NAFL (nonalcoholic fatty liver) (09/08/1446), OSA (obstructive sleep apnea) (02/24/2019), Pulmonary hypertension, Renal insufficiency, and RLS (restless legs syndrome) (04/27/2015).   She has a past surgical history that includes THIGH SURGERY; Shoulder surgery (Right); Breast reduction surgery; Eye surgery (Right); Hammer toe surgery; and LEFT HEART CATH AND CORONARY ANGIOGRAPHY (N/A,  02/03/2018).   Her family history includes Alcohol abuse in her sister; Diabetes in her brother and mother; Heart disease in her brother, mother, and sister; Mental illness in her brother.She reports that she has never smoked. She has never used smokeless tobacco. She reports that she does not drink alcohol or use drugs.    ROS Review of Systems  Constitutional: Positive for activity change and fatigue. Negative for fever.  HENT: Negative.   Respiratory: Negative for shortness of breath.   Cardiovascular: Negative for chest pain.  Gastrointestinal: Negative for abdominal pain, constipation, diarrhea, nausea and vomiting.  Genitourinary: Negative for difficulty urinating.  Musculoskeletal: Positive for arthralgias. Negative for myalgias.  Neurological: Positive for dizziness, weakness and headaches.  Psychiatric/Behavioral: Negative for sleep disturbance.    Objective:  BP (!) 76/54   Pulse 60   Temp 98.4 F (36.9 C) (Temporal)   Ht 5' 2"  (1.575 m)   Wt 201 lb 6.4 oz (91.4 kg)   SpO2 97%   BMI 36.84 kg/m   BP Readings from Last 3 Encounters:  05/11/19 104/68  05/11/19 (!) 76/54  04/25/19 119/77    Wt Readings from Last 3 Encounters:  05/11/19 200 lb (90.7 kg)  05/11/19 201 lb 6.4 oz (91.4 kg)  04/25/19 199 lb (90.3 kg)     Physical Exam Constitutional:      General: She is in acute distress.     Appearance: She is well-developed. She is obese. She is ill-appearing.  HENT:     Head: Normocephalic and atraumatic.     Right Ear: External ear normal.     Left Ear: External ear normal.     Nose: Nose normal.  Eyes:     Conjunctiva/sclera:  Conjunctivae normal.     Pupils: Pupils are equal, round, and reactive to light.  Neck:     Thyroid: No thyromegaly.  Cardiovascular:     Rate and Rhythm: Normal rate and regular rhythm.     Heart sounds: Normal heart sounds. No murmur.  Pulmonary:     Effort: Pulmonary effort is normal. No respiratory distress.     Breath  sounds: Normal breath sounds. No wheezing or rales.  Abdominal:     General: Bowel sounds are normal. There is no distension.     Palpations: Abdomen is soft.     Tenderness: There is no abdominal tenderness.  Musculoskeletal:     Cervical back: Normal range of motion and neck supple.  Lymphadenopathy:     Cervical: No cervical adenopathy.  Skin:    General: Skin is warm and dry.  Neurological:     Mental Status: She is alert and oriented to person, place, and time.     Deep Tendon Reflexes: Reflexes are normal and symmetric.  Psychiatric:        Behavior: Behavior normal.        Thought Content: Thought content normal.        Judgment: Judgment normal.       Assessment & Plan:   Amber Stephenson was seen today for numbness.  Diagnoses and all orders for this visit:  Hypotension due to hypovolemia       I am having Amber Stephenson. Awe maintain her Vitamin D3, naloxone, Dulaglutide, Insulin Glargine, budesonide-formoterol, diltiazem, rizatriptan, Eszopiclone, carbamazepine, acetaminophen, polyvinyl alcohol, topiramate, atorvastatin, metoprolol tartrate, Vascepa, apixaban, furosemide, ProAir HFA, oxyCODONE-acetaminophen, pregabalin, traZODone, ARIPiprazole, LORazepam, potassium chloride SA, ibuprofen, and metoprolol tartrate.  Allergies as of 05/11/2019      Reactions   Ivp Dye [iodinated Diagnostic Agents] Anaphylaxis, Swelling   Throat closes   Latex    Latex tape pulls skin with it      Medication List       Accurate as of May 11, 2019 11:59 PM. If you have any questions, ask your nurse or doctor.        acetaminophen 325 MG tablet Commonly known as: TYLENOL Take 325-650 mg by mouth every 6 (six) hours as needed for mild pain, moderate pain or headache.   apixaban 5 MG Tabs tablet Commonly known as: Eliquis Take 1 tablet (5 mg total) by mouth 2 (two) times daily.   ARIPiprazole 10 MG tablet Commonly known as: ABILIFY Take 3 tablets (30 mg total) by mouth daily.  What changed:   how much to take  when to take this   atorvastatin 40 MG tablet Commonly known as: LIPITOR TAKE 1 TABLET DAILY   budesonide-formoterol 80-4.5 MCG/ACT inhaler Commonly known as: Symbicort Inhale 2 puffs into the lungs 2 (two) times daily. What changed:   when to take this  reasons to take this   carbamazepine 100 MG 12 hr capsule Commonly known as: CARBATROL Take 1 capsule (100 mg total) by mouth daily.   diltiazem 120 MG 24 hr capsule Commonly known as: CARDIZEM CD TAKE 1 CAPSULE AT BEDTIME   Dulaglutide 1.5 MG/0.5ML Sopn Commonly known as: Trulicity Inject 1.5 mg into the skin once a week.   Eszopiclone 3 MG Tabs Commonly known as: eszopiclone Take 1 tablet (3 mg total) by mouth at bedtime as needed. Take immediately before bedtime What changed: reasons to take this   furosemide 40 MG tablet Commonly known as: LASIX Take 1 tablet (40 mg total) by mouth  every morning.   ibuprofen 800 MG tablet Commonly known as: ADVIL TAKE 1 TABLET BY MOUTH EVERY 8 HOURS AS NEEDED( USE SPARINGLY WITH BEING ON ELIQUIS)   Insulin Glargine 100 UNIT/ML Solostar Pen Commonly known as: Lantus SoloStar INJECT 60 UNITS UNDER THE SKIN TWICE A DAY What changed:   how much to take  how to take this  when to take this  additional instructions   LORazepam 1 MG tablet Commonly known as: Ativan Take 1-2 tablets (1-2 mg total) by mouth See admin instructions. Take 1 tab (1 mg) in the morning and 2 tabs (2 mg ) at bedtime   metoprolol tartrate 100 MG tablet Commonly known as: LOPRESSOR Take 1 tablet (100 mg total) by mouth 2 (two) times daily. Take 133m + 256mfor a total of 12572mwice a day.   metoprolol tartrate 25 MG tablet Commonly known as: LOPRESSOR TAKE 1 TABLET TWICE A DAY (TO BE TAKEN ALONG WITH THE LOPRESSOR 100 MG TWICE A DAY)   Narcan 4 MG/0.1ML Liqd nasal spray kit Generic drug: naloxone Place 1 spray into the nose once as needed (opiod overdose).    oxyCODONE-acetaminophen 5-325 MG tablet Commonly known as: PERCOCET/ROXICET   polyvinyl alcohol 1.4 % ophthalmic solution Commonly known as: LIQUIFILM TEARS Place 1 drop into both eyes as needed for dry eyes.   potassium chloride SA 20 MEQ tablet Commonly known as: KLOR-CON Take 1 tablet (20 mEq total) by mouth daily.   pregabalin 150 MG capsule Commonly known as: Lyrica Take 1 capsule (150 mg total) by mouth 2 (two) times daily.   ProAir HFA 108 (90 Base) MCG/ACT inhaler Generic drug: albuterol   rizatriptan 10 MG disintegrating tablet Commonly known as: MAXALT-MLT DISSOLVE 1 TABLET ON THE TONGUE AT FIRST SIGN OF HEADACHE. MAY REPEAT ONE DOSE IN 1 HOUR IF NEEDED What changed:   how much to take  how to take this  when to take this   topiramate 25 MG tablet Commonly known as: TOPAMAX Take 2 tablets every night What changed:   how much to take  how to take this  when to take this  additional instructions   traZODone 100 MG tablet Commonly known as: DESYREL Take 4 tablets (400 mg total) by mouth at bedtime.   Vascepa 1 g capsule Generic drug: icosapent Ethyl TAKE 2 CAPSULES TWICE A DAY WITH MEALS   Vitamin D3 25 MCG (1000 UT) Caps Take 1,000 Units by mouth daily.        Follow-up: [facility] further workup.   WarClaretta Fraise.D.

## 2019-05-11 NOTE — ED Provider Notes (Signed)
[facility] EMERGENCY DEPARTMENT Provider Note   CSN: [id number] Arrival date & time: 05/11/19  1631     History   Chief Complaint Chief Complaint  Patient presents with  . Dizziness    HPI IMANII ROOD is a 73 y.o. female.     Patient sent in from primary care office for dizziness.  Patient states she has had trouble with dizziness on and off for about a year which resulted in some falls.  She is being seen by neurology chart review shows that neurology evaluation was more for cognitive concerns.  Patient states she has an MRI pending.  She does not want an MRI today.  She does not want any lab work.  She states that all the dizziness has resolved.  It did have some numbness associated with it to the right thumb only no other neuro abnormalities.  Patient's vital signs heart rate is 56 blood pressure 112/56 temp 98.7 oxygen sats 94% respirations 19.     Past Medical History:  Diagnosis Date  . Atrial fibrillation with RVR 05/19/2017  . Bipolar I disorder 01/23/2015  . CAD (coronary artery disease)    Nonobstructive; Managed by Dr. Bronson Ing  . Cardiomegaly 01/12/2018  . Chronic anticoagulation 05/30/2015  . Chronic back pain    Lower back  . Chronic diastolic CHF (congestive heart failure) 05/30/2015  . Diabetes mellitus, type 2, without complication   . Diabetic neuropathy 02/06/2016  . Essential hypertension   . Fibromyalgia 03/19/2017  . Herpes genitalis in women 07/16/2015  . Hyperlipidemia   . Hypertension   . Hypoxia 02/01/2019  . Insomnia 01/23/2015  . Lipoma 02/08/2015  . Major depressive disorder   . Migraine headache with aura 02/12/2016  . 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[place]) 04/21/2019  . NAFL (nonalcoholic fatty liver) 123XX123  . OSA (obstructive sleep apnea) 02/24/2019   10/09/2018 - HST  - AHI 40.6   . Pulmonary hypertension   . Renal insufficiency    Managed by Dr. Wallace Keller  . RLS (restless legs syndrome) 04/27/2015    Patient Active Problem  List   Diagnosis Date Noted  . Vertigo 04/25/2019  . 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[place]) 04/21/2019  . OSA (obstructive sleep apnea) 02/24/2019  . Allergic rhinitis 02/24/2019  . Hypoxia 02/01/2019  . Acute on chronic respiratory failure with hypoxia ([place]) 01/22/2019  . Morbid obesity ([place]) 07/06/2018  . Obesity (BMI 30-39.9) 07/06/2018  . Closed fracture of proximal end of left humerus 03/16/2018  . Fracture of four ribs of left side, closed, initial encounter 03/06/2018  . Cardiomegaly 01/12/2018  . NAFL (nonalcoholic fatty liver) 123456  . Age-related osteoporosis without current pathological fracture 12/15/2017  . Asthma 11/12/2017  . Atrial fibrillation with RVR ([place]) 05/19/2017  . Microscopic colitis 05/19/2017  . Hypotension 05/19/2017  . Fibromyalgia 03/19/2017  . Migraine headache with aura 02/12/2016  . Diabetic neuropathy ([place]) 02/06/2016  . Major depressive disorder   . Herpes genitalis in women 07/16/2015  . Chronic anticoagulation 05/30/2015  . Chronic diastolic CHF (congestive heart failure) ([place]) 05/30/2015  . Chest pain with moderate risk of acute coronary syndrome 05/29/2015  . Dyspnea on exertion   . Essential hypertension   . RLS (restless legs syndrome) 04/27/2015  . Lipoma 02/08/2015  . Bipolar disorder ([place]) 01/23/2015  . Insomnia 01/23/2015  . Chronic back pain 01/04/2015  . Permanent atrial fibrillation 01/04/2015  . DM2 (diabetes mellitus, type 2) ([place])   . Hyperlipidemia     Past Surgical History:  Procedure Laterality Date  . BREAST REDUCTION SURGERY    . EYE SURGERY Right    cateracts  . HAMMER TOE SURGERY    . LEFT HEART CATH AND CORONARY ANGIOGRAPHY N/A 02/03/2018   Procedure: LEFT HEART CATH AND CORONARY ANGIOGRAPHY;  Surgeon: Martinique, Peter M, MD;  Location: [facility];  Service: Cardiovascular;  Laterality: N/A;  . SHOULDER SURGERY Right    "I BROKE MY SHOUDLER  . THIGH SURGERY     "TO REMOVE A TUMOR "     OB  History   No obstetric history on file.      Home Medications    Prior to Admission medications   Medication Sig Start Date End Date Taking? Authorizing Provider  acetaminophen (TYLENOL) 325 MG tablet Take 325-650 mg by mouth every 6 (six) hours as needed for mild pain, moderate pain or headache.    [provider]  apixaban (ELIQUIS) 5 MG TABS tablet Take 1 tablet (5 mg total) by mouth 2 (two) times daily. 02/15/19   Evelina Dun A, FNP  ARIPiprazole (ABILIFY) 10 MG tablet Take 3 tablets (30 mg total) by mouth daily. Patient taking differently: Take 10 mg by mouth 2 (two) times daily.  03/24/19   Plovsky, Berneta Sages, MD  atorvastatin (LIPITOR) 40 MG tablet TAKE 1 TABLET DAILY 02/02/19   Evelina Dun A, FNP  budesonide-formoterol (SYMBICORT) 80-4.5 MCG/ACT inhaler Inhale 2 puffs into the lungs 2 (two) times daily. Patient taking differently: Inhale 2 puffs into the lungs 2 (two) times daily as needed (shortness of breath).  03/04/18   [name], DO  carbamazepine (CARBATROL) 100 MG 12 hr capsule Take 1 capsule (100 mg total) by mouth daily. 12/23/18 12/23/19  Plovsky, Berneta Sages, MD  Cholecalciferol (VITAMIN D3) 1000 UNITS CAPS Take 1,000 Units by mouth daily.    [provider]  diltiazem (CARDIZEM CD) 120 MG 24 hr capsule TAKE 1 CAPSULE AT BEDTIME Patient taking differently: Take 120 mg by mouth at bedtime.  11/04/18   Herminio Commons, MD  Dulaglutide (TRULICITY) 1.5 0000000 SOPN Inject 1.5 mg into the skin once a week. 08/20/17   Timmothy Euler, MD  Eszopiclone (ESZOPICLONE) 3 MG TABS Take 1 tablet (3 mg total) by mouth at bedtime as needed. Take immediately before bedtime Patient taking differently: Take 3 mg by mouth at bedtime as needed (sleep). Take immediately before bedtime 12/23/18 12/23/19  Norma Fredrickson, MD  furosemide (LASIX) 40 MG tablet Take 1 tablet (40 mg total) by mouth every morning. 02/15/19   Herminio Commons, MD  ibuprofen (ADVIL) 800 MG  tablet TAKE 1 TABLET BY MOUTH EVERY 8 HOURS AS NEEDED( USE SPARINGLY WITH BEING ON ELIQUIS) 04/15/19   Dettinger, Fransisca Kaufmann, MD  Insulin Glargine (LANTUS SOLOSTAR) 100 UNIT/ML Solostar Pen INJECT 60 UNITS UNDER THE SKIN TWICE A DAY Patient taking differently: Inject 60 Units into the skin 2 (two) times daily.  01/25/18   Herminio Commons, MD  LORazepam (ATIVAN) 1 MG tablet Take 1-2 tablets (1-2 mg total) by mouth See admin instructions. Take 1 tab (1 mg) in the morning and 2 tabs (2 mg ) at bedtime 03/24/19   Plovsky, Berneta Sages, MD  metoprolol tartrate (LOPRESSOR) 100 MG tablet Take 1 tablet (100 mg total) by mouth 2 (two) times daily. Take 100mg  + 25mg  for a total of 125mg  twice a day. 02/04/19   Johnson, Clanford L, MD  metoprolol tartrate (LOPRESSOR) 25 MG tablet TAKE 1 TABLET TWICE A DAY (TO  BE TAKEN ALONG WITH THE LOPRESSOR 100 MG TWICE A DAY) 04/25/19   Herminio Commons, MD  naloxone [facility]) nasal spray 4 mg/0.1 mL Place 1 spray into the nose once as needed (opiod overdose).    [provider]  oxyCODONE-acetaminophen (PERCOCET/ROXICET) 5-325 MG tablet  02/28/19   [provider]  polyvinyl alcohol (LIQUIFILM TEARS) 1.4 % ophthalmic solution Place 1 drop into both eyes as needed for dry eyes.    [provider]  potassium chloride SA (KLOR-CON) 20 MEQ tablet Take 1 tablet (20 mEq total) by mouth daily. 03/28/19   Herminio Commons, MD  pregabalin (LYRICA) 150 MG capsule Take 1 capsule (150 mg total) by mouth 2 (two) times daily. 03/24/19 03/23/20  Norma Fredrickson, MD  PROAIR HFA 108 [phone] Base) MCG/ACT inhaler  02/18/19   [provider]  rizatriptan (MAXALT-MLT) 10 MG disintegrating tablet DISSOLVE 1 TABLET ON THE TONGUE AT FIRST SIGN OF HEADACHE. MAY REPEAT ONE DOSE IN 1 HOUR IF NEEDED Patient taking differently: Take 10 mg by mouth See admin instructions. DISSOLVE 1 TABLET ON THE TONGUE AT FIRST SIGN OF HEADACHE. MAY REPEAT ONE DOSE IN 1 HOUR IF NEEDED  11/23/18   Ronnie Doss M, DO  topiramate (TOPAMAX) 25 MG tablet Take 2 tablets every night Patient taking differently: Take 50 mg by mouth at bedtime. One in AM Take 2 tablets every night 01/26/19   Cameron Sprang, MD  traZODone (DESYREL) 100 MG tablet Take 4 tablets (400 mg total) by mouth at bedtime. 03/24/19   Norma Fredrickson, MD  VASCEPA 1 g CAPS TAKE 2 CAPSULES TWICE A DAY WITH MEALS 02/15/19   Sharion Balloon, FNP    Family History Family History  Problem Relation Age of Onset  . Diabetes Mother   . Heart disease Mother   . Heart disease Brother   . Heart disease Sister        CABG  . Alcohol abuse Sister   . Mental illness Brother   . Diabetes Brother     Social History Social History   Tobacco Use  . Smoking status: Never Smoker  . Smokeless tobacco: Never Used  Substance Use Topics  . Alcohol use: No    Alcohol/week: 0.0 standard drinks  . Drug use: No     Allergies   Ivp dye [iodinated diagnostic agents] and Latex   Review of Systems Review of Systems  Constitutional: Negative for chills and fever.  HENT: Negative for congestion, rhinorrhea and sore throat.   Eyes: Negative for visual disturbance.  Respiratory: Negative for cough and shortness of breath.   Cardiovascular: Negative for chest pain and leg swelling.  Gastrointestinal: Negative for abdominal pain, diarrhea, nausea and vomiting.  Genitourinary: Negative for dysuria.  Musculoskeletal: Negative for back pain and neck pain.  Skin: Negative for rash.  Neurological: Positive for dizziness and numbness. Negative for light-headedness and headaches.  Hematological: Does not bruise/bleed easily.  Psychiatric/Behavioral: Negative for confusion.     Physical Exam Updated Vital Signs BP (!) 112/56   Pulse (!) 56   Temp 98.7 F (37.1 C)   Resp 19   Ht 1.575 m (5\' 2" )   Wt 90.7 kg   SpO2 94%   BMI 36.58 kg/m   Physical Exam Vitals signs and nursing note reviewed.  Constitutional:       General: She is not in acute distress.    Appearance: Normal appearance. She is well-developed.  HENT:     Head: Normocephalic  and atraumatic.  Eyes:     Extraocular Movements: Extraocular movements intact.     Conjunctiva/sclera: Conjunctivae normal.     Pupils: Pupils are equal, round, and reactive to light.  Neck:     Musculoskeletal: Normal range of motion and neck supple.  Cardiovascular:     Rate and Rhythm: Normal rate and regular rhythm.     Heart sounds: No murmur.  Pulmonary:     Effort: Pulmonary effort is normal. No respiratory distress.     Breath sounds: Normal breath sounds.  Abdominal:     Palpations: Abdomen is soft.     Tenderness: There is no abdominal tenderness.  Musculoskeletal: Normal range of motion.        General: No swelling.  Skin:    General: Skin is warm and dry.  Neurological:     General: No focal deficit present.     Mental Status: She is alert and oriented to person, place, and time.     Cranial Nerves: No cranial nerve deficit.     Sensory: No sensory deficit.     Motor: No weakness.      ED Treatments / Results  Labs (all labs ordered are listed, but only abnormal results are displayed) Labs Reviewed - No data to display  EKG None  Radiology No results found.  Procedures Procedures (including critical care time)  Medications Ordered in ED Medications - No data to display   Initial Impression / Assessment and Plan / ED Course  I have reviewed the triage vital signs and the nursing notes.  Pertinent labs & imaging results that were available during my care of the patient were reviewed by me and considered in my medical decision making (see chart for details).       Offered to do MRI brain here tonight to rule out any concerns for possible stroke.  Patient states that she has been experiencing this on and off for a year.  And that her neurology folks are planning an MRI.  Patient does not want lab work does not want MRI now.   Patient states all symptoms have resolved.   Final Clinical Impressions(s) / ED Diagnoses   Final diagnoses:  Dizziness    ED Discharge Orders    None       Fredia Sorrow, MD 05/11/19 1733

## 2019-05-11 NOTE — Discharge Instructions (Signed)
Return for any new or worse symptoms.  Return for worse dizziness.  Make an appointment to follow-up with neurology for MRI brain due to the episodes of dizziness that you have had over the past year.

## 2019-05-11 NOTE — ED Triage Notes (Signed)
Pt c/o of dizziness.  Pt went to PCP, PCP sent pt to ER for eval.  Pt now denies dizziness.  HX of vertigo

## 2019-05-13 ENCOUNTER — Encounter: Payer: Self-pay | Admitting: Family Medicine

## 2019-05-13 ENCOUNTER — Telehealth: Payer: Self-pay | Admitting: Family Medicine

## 2019-05-13 NOTE — Telephone Encounter (Signed)
Do not see sumatriptan in list nor do I see it in any office note- will have to check with Dr. Lyn Records

## 2019-05-13 NOTE — Telephone Encounter (Signed)
Pt states she saw stacks and he told her he was going to send summatriptan 100mg  into pharmacy for her but I don't see it on her med list.

## 2019-05-16 ENCOUNTER — Telehealth: Payer: Self-pay | Admitting: Family Medicine

## 2019-05-16 ENCOUNTER — Other Ambulatory Visit: Payer: Self-pay | Admitting: Family Medicine

## 2019-05-16 MED ORDER — SUMATRIPTAN SUCCINATE 100 MG PO TABS: 100.0000 mg | ORAL_TABLET | ORAL | 5 refills | Status: DC | PRN

## 2019-05-16 NOTE — Telephone Encounter (Signed)
Aware. 

## 2019-05-16 NOTE — Telephone Encounter (Signed)
Dr. Livia Snellen , please advise on requested medication.

## 2019-05-16 NOTE — Telephone Encounter (Signed)
Aware.  Must be seen by provider and have lipids drawn.   No refills on cholesterol medications till after that.

## 2019-05-16 NOTE — Telephone Encounter (Signed)
I sent in the requested prescription 

## 2019-05-17 ENCOUNTER — Encounter: Payer: Self-pay | Admitting: Pulmonary Disease

## 2019-05-17 ENCOUNTER — Other Ambulatory Visit: Payer: Self-pay

## 2019-05-17 ENCOUNTER — Ambulatory Visit (INDEPENDENT_AMBULATORY_CARE_PROVIDER_SITE_OTHER): Payer: Medicare Other | Admitting: Pulmonary Disease

## 2019-05-17 VITALS — BP 122/64 | HR 72 | Ht 62.0 in | Wt 199.4 lb

## 2019-05-17 DIAGNOSIS — G4733 Obstructive sleep apnea (adult) (pediatric): Secondary | ICD-10-CM

## 2019-05-17 NOTE — Patient Instructions (Signed)
Severe obstructive sleep apnea  We will refer you to a DME company I will supply you with CPAP  Auto CPAP settings of 5-15  I will see you back in about 3 months  Call with significant concerns Sleep Apnea Sleep apnea is a condition in which breathing pauses or becomes shallow during sleep. Episodes of sleep apnea usually last 10 seconds or longer, and they may occur as many as 20 times an hour. Sleep apnea disrupts your sleep and keeps your body from getting the rest that it needs. This condition can increase your risk of certain health problems, including:  Heart attack.  Stroke.  Obesity.  Diabetes.  Heart failure.  Irregular heartbeat. What are the causes? There are three kinds of sleep apnea:  Obstructive sleep apnea. This kind is caused by a blocked or collapsed airway.  Central sleep apnea. This kind happens when the part of the brain that controls breathing does not send the correct signals to the muscles that control breathing.  Mixed sleep apnea. This is a combination of obstructive and central sleep apnea. The most common cause of this condition is a collapsed or blocked airway. An airway can collapse or become blocked if:  Your throat muscles are abnormally relaxed.  Your tongue and tonsils are larger than normal.  You are overweight.  Your airway is smaller than normal. What increases the risk? You are more likely to develop this condition if you:  Are overweight.  Smoke.  Have a smaller than normal airway.  Are elderly.  Are female.  Drink alcohol.  Take sedatives or tranquilizers.  Have a family history of sleep apnea. What are the signs or symptoms? Symptoms of this condition include:  Trouble staying asleep.  Daytime sleepiness and tiredness.  Irritability.  Loud snoring.  Morning headaches.  Trouble concentrating.  Forgetfulness.  Decreased interest in sex.  Unexplained sleepiness.  Mood swings.  Personality  changes.  Feelings of depression.  Waking up often during the night to urinate.  Dry mouth.  Sore throat. How is this diagnosed? This condition may be diagnosed with:  A medical history.  A physical exam.  A series of tests that are done while you are sleeping (sleep study). These tests are usually done in a sleep lab, but they may also be done at home. How is this treated? Treatment for this condition aims to restore normal breathing and to ease symptoms during sleep. It may involve managing health issues that can affect breathing, such as high blood pressure or obesity. Treatment may include:  Sleeping on your side.  Using a decongestant if you have nasal congestion.  Avoiding the use of depressants, including alcohol, sedatives, and narcotics.  Losing weight if you are overweight.  Making changes to your diet.  Quitting smoking.  Using a device to open your airway while you sleep, such as: ? An oral appliance. This is a custom-made mouthpiece that shifts your lower jaw forward. ? A continuous positive airway pressure (CPAP) device. This device blows air through a mask when you breathe out (exhale). ? A nasal expiratory positive airway pressure (EPAP) device. This device has valves that you put into each nostril. ? A bi-level positive airway pressure (BPAP) device. This device blows air through a mask when you breathe in (inhale) and breathe out (exhale).  Having surgery if other treatments do not work. During surgery, excess tissue is removed to create a wider airway. It is important to get treatment for sleep apnea. Without treatment,  this condition can lead to:  High blood pressure.  Coronary artery disease.  In men, an inability to achieve or maintain an erection (impotence).  Reduced thinking abilities. Follow these instructions at home: Lifestyle  Make any lifestyle changes that your health care provider recommends.  Eat a healthy, well-balanced  diet.  Take steps to lose weight if you are overweight.  Avoid using depressants, including alcohol, sedatives, and narcotics.  Do not use any products that contain nicotine or tobacco, such as cigarettes, e-cigarettes, and chewing tobacco. If you need help quitting, ask your health care provider. General instructions  Take over-the-counter and prescription medicines only as told by your health care provider.  If you were given a device to open your airway while you sleep, use it only as told by your health care provider.  If you are having surgery, make sure to tell your health care provider you have sleep apnea. You may need to bring your device with you.  Keep all follow-up visits as told by your health care provider. This is important. Contact a health care provider if:  The device that you received to open your airway during sleep is uncomfortable or does not seem to be working.  Your symptoms do not improve.  Your symptoms get worse. Get help right away if:  You develop: ? Chest pain. ? Shortness of breath. ? Discomfort in your back, arms, or stomach.  You have: ? Trouble speaking. ? Weakness on one side of your body. ? Drooping in your face. These symptoms may represent a serious problem that is an emergency. Do not wait to see if the symptoms will go away. Get medical help right away. Call your local emergency services (911 in the [place] Do not drive yourself to the hospital. Summary  Sleep apnea is a condition in which breathing pauses or becomes shallow during sleep.  The most common cause is a collapsed or blocked airway.  The goal of treatment is to restore normal breathing and to ease symptoms during sleep. This information is not intended to replace advice given to you by your health care provider. Make sure you discuss any questions you have with your health care provider. Document Released: 05/09/2002 Document Revised: 03/05/2018 Document Reviewed:  01/12/2018 Elsevier Patient Education  2020 [employer].

## 2019-05-17 NOTE — Progress Notes (Signed)
Subjective:    Patient ID: Amber Stephenson, female    DOB: 08-Jan-1946, 73 y.o.   MRN: [id number]  Patient being seen for possible obstructive sleep  She did have a sleep study performed recently revealing severe obstructive sleep apnea She denies a history of snoring, spouse was present during for the visit, denies snoring, denies witnessed apnea  Usually goes to bed between 11 and 12, takes about 10 minutes to fall asleep Final awakening time about 9 AM  Admits to dryness of the mouth in the mornings Occasional headaches Memory has been poor Brother has obstructive sleep apnea  She does take the occasional daytime naps, the naps are refreshing   Past Medical History:  Diagnosis Date  . Atrial fibrillation with RVR 05/19/2017  . Bipolar I disorder 01/23/2015  . CAD (coronary artery disease)    Nonobstructive; Managed by Dr. Bronson Ing  . Cardiomegaly 01/12/2018  . Chronic anticoagulation 05/30/2015  . Chronic back pain    Lower back  . Chronic diastolic CHF (congestive heart failure) 05/30/2015  . Diabetes mellitus, type 2, without complication   . Diabetic neuropathy 02/06/2016  . Essential hypertension   . Fibromyalgia 03/19/2017  . Herpes genitalis in women 07/16/2015  . Hyperlipidemia   . Hypertension   . Hypoxia 02/01/2019  . Insomnia 01/23/2015  . Lipoma 02/08/2015  . Major depressive disorder   . Migraine headache with aura 02/12/2016  . 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[place]) 04/21/2019  . NAFL (nonalcoholic fatty liver) 123XX123  . OSA (obstructive sleep apnea) 02/24/2019   10/09/2018 - HST  - AHI 40.6   . Pulmonary hypertension   . Renal insufficiency    Managed by Dr. Wallace Keller  . RLS (restless legs syndrome) 04/27/2015   Social History   Socioeconomic History  . Marital status: Married    Spouse name: Orpah Greek  . Number of children: 3  . Years of education: 61  . Highest education level: Associate degree: academic program  Occupational History  .  Occupation: Retired  Tobacco Use  . Smoking status: Never Smoker  . Smokeless tobacco: Never Used  Substance and Sexual Activity  . Alcohol use: No    Alcohol/week: 0.0 standard drinks  . Drug use: No  . Sexual activity: Yes    Partners: Male    Birth control/protection: Post-menopausal  Other Topics Concern  . Not on file  Social History Narrative   Lives at home with husband.       Right handed   Social Determinants of Health   Financial Resource Strain:   . Difficulty of Paying Living Expenses: Not on file  Food Insecurity:   . Worried About Charity fundraiser in the Last Year: Not on file  . Ran Out of Food in the Last Year: Not on file  Transportation Needs:   . Lack of Transportation (Medical): Not on file  . Lack of Transportation (Non-Medical): Not on file  Physical Activity:   . Days of Exercise per Week: Not on file  . Minutes of Exercise per Session: Not on file  Stress:   . Feeling of Stress : Not on file  Social Connections:   . Frequency of Communication with Friends and Family: Not on file  . Frequency of Social Gatherings with Friends and Family: Not on file  . Attends Religious Services: Not on file  . Active Member of Clubs or Organizations: Not on file  . Attends Archivist Meetings: Not on file  . Marital  Status: Not on file  Intimate Partner Violence:   . Fear of Current or Ex-Partner: Not on file  . Emotionally Abused: Not on file  . Physically Abused: Not on file  . Sexually Abused: Not on file   Family History  Problem Relation Age of Onset  . Diabetes Mother   . Heart disease Mother   . Heart disease Brother   . Heart disease Sister        CABG  . Alcohol abuse Sister   . Mental illness Brother   . Diabetes Brother    Review of Systems  Constitutional: Negative for fever and unexpected weight change.  HENT: Positive for dental problem and ear pain. Negative for congestion, nosebleeds, postnasal drip, rhinorrhea, sinus  pressure, sneezing, sore throat and trouble swallowing.   Eyes: Negative for redness and itching.  Respiratory: Positive for shortness of breath. Negative for cough, chest tightness and wheezing.   Cardiovascular: Negative for palpitations and leg swelling.  Gastrointestinal: Negative for nausea and vomiting.  Genitourinary: Negative for dysuria.  Musculoskeletal: Negative for joint swelling.  Skin: Negative for rash.  Allergic/Immunologic: Negative.  Negative for environmental allergies, food allergies and immunocompromised state.  Neurological: Positive for headaches.  Hematological: Bruises/bleeds easily.  Psychiatric/Behavioral: Negative for dysphoric mood. The patient is nervous/anxious.       Objective:   Physical Exam Vitals:   05/17/19 1439  BP: 122/64  Pulse: 72  SpO2: 99%   Results of the Epworth flowsheet 05/17/2019 07/06/2018  Sitting and reading 1 2  Watching TV 1 2  Sitting, inactive in a public place (e.g. a theatre or a meeting) 1 2  As a passenger in a car for an hour without a break 0 0  Lying down to rest in the afternoon when circumstances permit 1 2  Sitting and talking to someone 0 0  Sitting quietly after a lunch without alcohol 0 0  In a car, while stopped for a few minutes in traffic 0 0  Total score 4 8   Sleep study reviewed with the patient -AHI of 40    Assessment & Plan:  .  Severe obstructive sleep apnea  .  Pathophysiology of sleep disordered breathing discussed with patient  .  Treatment options for sleep disordered breathing discussed with the patient  .  Morbidity risks with not treating sleep disorder breathing discussed  .  Effect of sleep disordered breathing on energy levels, fatigue, weight discussed  .  Plan .  Referral to DME company  .  Initiation of CPAP therapy at pressure of 5-18  .  I will follow-up in 3 months  .  Encouraged to continue weight loss efforts .  Call with any significant concerns

## 2019-05-19 ENCOUNTER — Other Ambulatory Visit: Payer: Self-pay

## 2019-05-20 ENCOUNTER — Encounter: Payer: Self-pay | Admitting: Family Medicine

## 2019-05-20 ENCOUNTER — Other Ambulatory Visit: Payer: Self-pay | Admitting: Family Medicine

## 2019-05-20 ENCOUNTER — Ambulatory Visit (INDEPENDENT_AMBULATORY_CARE_PROVIDER_SITE_OTHER): Payer: Medicare Other | Admitting: Family Medicine

## 2019-05-20 VITALS — BP 118/70 | HR 72 | Temp 97.8°F | Resp 20 | Ht 62.0 in | Wt 198.0 lb

## 2019-05-20 DIAGNOSIS — I10 Essential (primary) hypertension: Secondary | ICD-10-CM

## 2019-05-20 DIAGNOSIS — E1169 Type 2 diabetes mellitus with other specified complication: Secondary | ICD-10-CM

## 2019-05-20 DIAGNOSIS — E785 Hyperlipidemia, unspecified: Secondary | ICD-10-CM

## 2019-05-20 DIAGNOSIS — E1159 Type 2 diabetes mellitus with other circulatory complications: Secondary | ICD-10-CM | POA: Diagnosis not present

## 2019-05-20 DIAGNOSIS — R42 Dizziness and giddiness: Secondary | ICD-10-CM

## 2019-05-20 DIAGNOSIS — Z794 Long term (current) use of insulin: Secondary | ICD-10-CM

## 2019-05-20 DIAGNOSIS — H8110 Benign paroxysmal vertigo, unspecified ear: Secondary | ICD-10-CM

## 2019-05-20 DIAGNOSIS — H8113 Benign paroxysmal vertigo, bilateral: Secondary | ICD-10-CM

## 2019-05-20 DIAGNOSIS — E782 Mixed hyperlipidemia: Secondary | ICD-10-CM | POA: Insufficient documentation

## 2019-05-20 DIAGNOSIS — I152 Hypertension secondary to endocrine disorders: Secondary | ICD-10-CM

## 2019-05-20 DIAGNOSIS — Z79899 Other long term (current) drug therapy: Secondary | ICD-10-CM

## 2019-05-20 HISTORY — DX: Mixed hyperlipidemia: E78.2

## 2019-05-20 HISTORY — DX: Benign paroxysmal vertigo, unspecified ear: H81.10

## 2019-05-20 HISTORY — DX: Benign paroxysmal vertigo, bilateral: H81.13

## 2019-05-20 LAB — BAYER DCA HB A1C WAIVED: HB A1C (BAYER DCA - WAIVED): 7.1 % — ABNORMAL HIGH (ref <7.0)

## 2019-05-20 MED ORDER — ATORVASTATIN CALCIUM 40 MG PO TABS: 40.0000 mg | ORAL_TABLET | Freq: Every day | ORAL | 3 refills | Status: DC

## 2019-05-20 MED ORDER — ATORVASTATIN CALCIUM 40 MG PO TABS: 40.0000 mg | ORAL_TABLET | Freq: Every day | ORAL | 0 refills | Status: DC

## 2019-05-20 NOTE — Progress Notes (Signed)
Subjective:  Patient ID: Amber Stephenson, female    DOB: 1945/08/17, 73 y.o.   MRN: [id number]  Patient Care Team: Baruch Gouty, FNP as PCP - General (Family Medicine) Herminio Commons, MD as PCP - Cardiology (Cardiology) Weber Cooks, MD as Referring Physician (Orthopedic Surgery) Norma Fredrickson, MD as Consulting Physician (Psychiatry) Susette Racer, MD (Endocrinology)   Chief Complaint:  Hospitalization Follow-up [facility]ene[facility] ER 12/9 - hypotension and dizziness)   HPI: Amber Stephenson is a 73 y.o. female presenting on 05/20/2019 for Hospitalization Follow-up ([place] [facility] 12/9 - hypotension and dizziness)  Patient presents today for follow-up after discharge from the ED on 05/11/2019.  Patient was seen in the ED for dizziness that has been intermittent for over a year.  Declined MRI and blood work when in the ED due to resolution of symptoms.  Patient has been seen by neurology in the past but this was to evaluate cognitive function.  Patient has not followed up with neurology but is planning on making an appointment as she needs an MRI that they discussed at a previous visit.  Patient states she will follow up with neurology to have imaging completed. Pt is on several medications that can attribute to her dizziness. Long discussion about cutting back on medications. Pt needs to speak with psychiatry to see if she can be tapered off of some of her medications. She also needs to make follow up appointments with neurology and cardiology.   1. Dizziness Patient reports intermittent dizziness for over a year.  She states the dizziness comes and goes and is worse with any change in position specially when going from a sitting to standing position.  She denies any focal deficits with the dizziness.  No confusion, headache, visual disturbances, chest pain, palpitations, shortness of breath, or syncope.  2. Type 2 diabetes mellitus with other specified complication, with long-term  current use of insulin (HCC) Pt presents for follow up evaluation of Type 2 diabetes mellitus.  Current symptoms include paresthesia of the feet. Patient denies foot ulcerations, hyperglycemia, hypoglycemia , increased appetite, nausea, polydipsia, polyuria, visual disturbances, vomiting and weight loss.  Current diabetic medications include trulicity, lantus Compliant with meds - Yes  Current monitoring regimen: none Any episodes of hypoglycemia? no  Known diabetic complications: peripheral neuropathy and cardiovascular disease Cardiovascular risk factors: advanced age (older than 13 for men, 73 for women), diabetes mellitus, dyslipidemia, hypertension, obesity (BMI >= 30 kg/m2) and sedentary lifestyle Eye exam current (within one year): yes Podiatry yearly?  No Weight trend: stable Current diet: well balanced Current exercise: none  PNA Vaccine UTD?  Yes Hep B Vaccine?  Yes Tdap Vaccine UTD?  No Urine microalbumin UTD? No  Is She on ACE inhibitor or angiotensin II receptor blocker?  No Is She on statin? Yes atorvastatin Is She on ASA 81 mg daily?  No  3. Hypertension associated with diabetes ([place]) Complaint with meds - Yes Current Medications - Cardizem CD, lopressor Checking BP at home - No Exercising Regularly - No Watching Salt intake - No Pertinent ROS:  Headache - No Fatigue - No Visual Disturbances - No Chest pain - No Dyspnea - No Palpitations - No LE edema - No They report good compliance with medications and can restate their regimen by memory. No medication side effects.  Family, social, and smoking history reviewed.   BP Readings from Last 3 Encounters:  05/20/19 118/70  05/17/19 122/64  05/11/19 104/68   CMP  05/20/2019 03/27/2019 03/10/2019  Glucose WILL FOLLOW 219(H) 189(H)  BUN WILL FOLLOW 14 22  Creatinine WILL FOLLOW 0.99 0.89  Sodium WILL FOLLOW 138 137  Potassium WILL FOLLOW 4.0 4.6  Chloride WILL FOLLOW 108 100  CO2 WILL FOLLOW 19(L) 25    Calcium WILL FOLLOW 9.2 9.3  Total Protein WILL FOLLOW 6.7 -  Total Bilirubin WILL FOLLOW 0.7 -  Alkaline Phos WILL FOLLOW 93 -  AST WILL FOLLOW 21 -  ALT WILL FOLLOW 21 -      4. Hyperlipidemia associated with type 2 diabetes mellitus ([place]) Compliant with medications - Yes Current medications - atorvastatin Side effects from medications - No Diet - does not watch diet Exercise -  Does not exercise  Lab Results  Component Value Date   CHOL WILL FOLLOW 05/20/2019   HDL WILL FOLLOW 05/20/2019   [place] WILL FOLLOW 05/20/2019   LDLDIRECT 62 03/17/2016   TRIG WILL FOLLOW 05/20/2019   CHOLHDL WILL FOLLOW 05/20/2019     Family and personal medical history reviewed. Smoking and ETOH history reviewed.    5. Morbid obesity ([place]) Does not watch diet and does not exercise on a regular basis.      Relevant past medical, surgical, family, and social history reviewed and updated as indicated.  Allergies and medications reviewed and updated. Date reviewed: Chart in Epic.   Past Medical History:  Diagnosis Date  . Atrial fibrillation with RVR 05/19/2017  . Bipolar I disorder 01/23/2015  . CAD (coronary artery disease)    Nonobstructive; Managed by Dr. Bronson Ing  . Cardiomegaly 01/12/2018  . Chronic anticoagulation 05/30/2015  . Chronic back pain    Lower back  . Chronic diastolic CHF (congestive heart failure) 05/30/2015  . Diabetes mellitus, type 2, without complication   . Diabetic neuropathy 02/06/2016  . Essential hypertension   . Fibromyalgia 03/19/2017  . Herpes genitalis in women 07/16/2015  . Hyperlipidemia   . Hypertension   . Hypoxia 02/01/2019  . Insomnia 01/23/2015  . Lipoma 02/08/2015  . Major depressive disorder   . Migraine headache with aura 02/12/2016  . 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[place]) 04/21/2019  . NAFL (nonalcoholic fatty liver) 2/95/2841  . OSA (obstructive sleep apnea) 02/24/2019   10/09/2018 - HST  - AHI 40.6   . Pulmonary hypertension   .  Renal insufficiency    Managed by Dr. Wallace Keller  . RLS (restless legs syndrome) 04/27/2015    Past Surgical History:  Procedure Laterality Date  . BREAST REDUCTION SURGERY    . EYE SURGERY Right    cateracts  . HAMMER TOE SURGERY    . LEFT HEART CATH AND CORONARY ANGIOGRAPHY N/A 02/03/2018   Procedure: LEFT HEART CATH AND CORONARY ANGIOGRAPHY;  Surgeon: Martinique, Peter M, MD;  Location: [facility];  Service: Cardiovascular;  Laterality: N/A;  . SHOULDER SURGERY Right    "I BROKE MY SHOUDLER  . THIGH SURGERY     "TO REMOVE A TUMOR "    Social History   Socioeconomic History  . Marital status: Married    Spouse name: Orpah Greek  . Number of children: 3  . Years of education: 63  . Highest education level: Associate degree: academic program  Occupational History  . Occupation: Retired  Tobacco Use  . Smoking status: Never Smoker  . Smokeless tobacco: Never Used  Substance and Sexual Activity  . Alcohol use: No    Alcohol/week: 0.0 standard drinks  . Drug use: No  . Sexual activity: Yes  Partners: Male    Birth control/protection: Post-menopausal  Other Topics Concern  . Not on file  Social History Narrative   Lives at home with husband.       Right handed   Social Determinants of Health   Financial Resource Strain:   . Difficulty of Paying Living Expenses: Not on file  Food Insecurity:   . Worried About Charity fundraiser in the Last Year: Not on file  . Ran Out of Food in the Last Year: Not on file  Transportation Needs:   . Lack of Transportation (Medical): Not on file  . Lack of Transportation (Non-Medical): Not on file  Physical Activity:   . Days of Exercise per Week: Not on file  . Minutes of Exercise per Session: Not on file  Stress:   . Feeling of Stress : Not on file  Social Connections:   . Frequency of Communication with Friends and Family: Not on file  . Frequency of Social Gatherings with Friends and Family: Not on file  . Attends Religious  Services: Not on file  . Active Member of Clubs or Organizations: Not on file  . Attends Archivist Meetings: Not on file  . Marital Status: Not on file  Intimate Partner Violence:   . Fear of Current or Ex-Partner: Not on file  . Emotionally Abused: Not on file  . Physically Abused: Not on file  . Sexually Abused: Not on file    Outpatient Encounter Medications as of 05/20/2019  Medication Sig  . acetaminophen (TYLENOL) 325 MG tablet Take 325-650 mg by mouth every 6 (six) hours as needed for mild pain, moderate pain or headache.  Marland Kitchen apixaban (ELIQUIS) 5 MG TABS tablet Take 1 tablet (5 mg total) by mouth 2 (two) times daily.  . ARIPiprazole (ABILIFY) 10 MG tablet Take 3 tablets (30 mg total) by mouth daily. (Patient taking differently: Take 10 mg by mouth 2 (two) times daily. )  . atorvastatin (LIPITOR) 40 MG tablet Take 1 tablet (40 mg total) by mouth daily.  . budesonide-formoterol (SYMBICORT) 80-4.5 MCG/ACT inhaler Inhale 2 puffs into the lungs 2 (two) times daily. (Patient taking differently: Inhale 2 puffs into the lungs 2 (two) times daily as needed (shortness of breath). )  . carbamazepine (CARBATROL) 100 MG 12 hr capsule Take 1 capsule (100 mg total) by mouth daily.  . Cholecalciferol (VITAMIN D3) 1000 UNITS CAPS Take 1,000 Units by mouth daily.  Marland Kitchen diltiazem (CARDIZEM CD) 120 MG 24 hr capsule TAKE 1 CAPSULE AT BEDTIME (Patient taking differently: Take 120 mg by mouth at bedtime. )  . Dulaglutide (TRULICITY) 1.5 IX/1.8ZB SOPN Inject 1.5 mg into the skin once a week.  . Eszopiclone (ESZOPICLONE) 3 MG TABS Take 1 tablet (3 mg total) by mouth at bedtime as needed. Take immediately before bedtime (Patient taking differently: Take 3 mg by mouth at bedtime as needed (sleep). Take immediately before bedtime)  . furosemide (LASIX) 40 MG tablet Take 1 tablet (40 mg total) by mouth every morning.  Marland Kitchen ibuprofen (ADVIL) 800 MG tablet TAKE 1 TABLET BY MOUTH EVERY 8 HOURS AS NEEDED( USE  SPARINGLY WITH BEING ON ELIQUIS)  . Insulin Glargine (LANTUS SOLOSTAR) 100 UNIT/ML Solostar Pen INJECT 60 UNITS UNDER THE SKIN TWICE A DAY (Patient taking differently: Inject 60 Units into the skin 2 (two) times daily. )  . LORazepam (ATIVAN) 1 MG tablet Take 1-2 tablets (1-2 mg total) by mouth See admin instructions. Take 1 tab (1 mg) in  the morning and 2 tabs (2 mg ) at bedtime  . metoprolol tartrate (LOPRESSOR) 100 MG tablet Take 1 tablet (100 mg total) by mouth 2 (two) times daily. Take '100mg'$  + '25mg'$  for a total of '125mg'$  twice a day.  . metoprolol tartrate (LOPRESSOR) 25 MG tablet TAKE 1 TABLET TWICE A DAY (TO BE TAKEN ALONG WITH THE LOPRESSOR 100 MG TWICE A DAY)  . oxyCODONE-acetaminophen (PERCOCET/ROXICET) 5-325 MG tablet   . pregabalin (LYRICA) 150 MG capsule Take 1 capsule (150 mg total) by mouth 2 (two) times daily.  Marland Kitchen PROAIR HFA 108 (90 Base) MCG/ACT inhaler Inhale 1-2 puffs into the lungs every 6 (six) hours as needed. As needed  . SUMAtriptan (IMITREX) 100 MG tablet Take 1 tablet (100 mg total) by mouth every 2 (two) hours as needed for migraine. May repeat in 2 hours if headache persists or recurs.  . topiramate (TOPAMAX) 25 MG tablet Take 2 tablets every night (Patient taking differently: Take 50 mg by mouth at bedtime. One in AM Take 3 tablets every night)  . traZODone (DESYREL) 100 MG tablet Take 4 tablets (400 mg total) by mouth at bedtime.  Marland Kitchen VASCEPA 1 g CAPS TAKE 2 CAPSULES TWICE A DAY WITH MEALS  . [DISCONTINUED] atorvastatin (LIPITOR) 40 MG tablet TAKE 1 TABLET DAILY  . naloxone (NARCAN) nasal spray 4 mg/0.1 mL Place 1 spray into the nose once as needed (opiod overdose).  . potassium chloride SA (KLOR-CON) 20 MEQ tablet Take 1 tablet (20 mEq total) by mouth daily.  . [DISCONTINUED] atorvastatin (LIPITOR) 40 MG tablet Take 1 tablet (40 mg total) by mouth daily.  . [DISCONTINUED] polyvinyl alcohol (LIQUIFILM TEARS) 1.4 % ophthalmic solution Place 1 drop into both eyes as needed for  dry eyes.  . [DISCONTINUED] rizatriptan (MAXALT-MLT) 10 MG disintegrating tablet DISSOLVE 1 TABLET ON THE TONGUE AT FIRST SIGN OF HEADACHE. MAY REPEAT ONE DOSE IN 1 HOUR IF NEEDED (Patient taking differently: Take 10 mg by mouth See admin instructions. DISSOLVE 1 TABLET ON THE TONGUE AT FIRST SIGN OF HEADACHE. MAY REPEAT ONE DOSE IN 1 HOUR IF NEEDED)   Facility-Administered Encounter Medications as of 05/20/2019  Medication  . bupivacaine-epinephrine (MARCAINE W/ EPI) 0.5% -1:200000 injection    Allergies  Allergen Reactions  . Ivp Dye [Iodinated Diagnostic Agents] Anaphylaxis and Swelling    Throat closes  . Latex     Latex tape pulls skin with it    Review of Systems  Constitutional: Negative for activity change, appetite change, chills, diaphoresis, fatigue, fever and unexpected weight change.  HENT: Negative.   Eyes: Negative.  Negative for photophobia and visual disturbance.  Respiratory: Negative for cough, chest tightness and shortness of breath.   Cardiovascular: Negative for chest pain, palpitations and leg swelling.  Gastrointestinal: Negative for abdominal distention, abdominal pain, anal bleeding, blood in stool, constipation, diarrhea, nausea, rectal pain and vomiting.  Endocrine: Negative.  Negative for cold intolerance, heat intolerance, polydipsia, polyphagia and polyuria.  Genitourinary: Negative for decreased urine volume, difficulty urinating, dysuria, flank pain, frequency, hematuria and urgency.  Musculoskeletal: Positive for myalgias. Negative for arthralgias.  Skin: Negative.  Negative for color change and pallor.  Allergic/Immunologic: Negative.   Neurological: Positive for dizziness. Negative for tremors, seizures, syncope, facial asymmetry, speech difficulty, weakness, light-headedness, numbness and headaches.  Hematological: Negative.   Psychiatric/Behavioral: Negative for confusion, hallucinations, sleep disturbance and suicidal ideas.  All other systems  reviewed and are negative.       Objective:  BP 118/70  Pulse 72   Temp 97.8 F (36.6 C)   Resp 20   Ht '5\' 2"'$  (1.575 m)   Wt 198 lb (89.8 kg)   SpO2 95%   BMI 36.21 kg/m    Wt Readings from Last 3 Encounters:  05/20/19 198 lb (89.8 kg)  05/17/19 199 lb 6.4 oz (90.4 kg)  05/11/19 200 lb (90.7 kg)    Physical Exam Vitals and nursing note reviewed.  Constitutional:      General: She is not in acute distress.    Appearance: Normal appearance. She is well-developed and well-groomed. She is morbidly obese. She is not ill-appearing, toxic-appearing or diaphoretic.  HENT:     Head: Normocephalic and atraumatic.     Jaw: There is normal jaw occlusion.     Right Ear: Hearing normal.     Left Ear: Hearing normal.     Nose: Nose normal.     Mouth/Throat:     Lips: Pink.     Mouth: Mucous membranes are moist.     Pharynx: Oropharynx is clear. Uvula midline.  Eyes:     General: Lids are normal.     Extraocular Movements: Extraocular movements intact.     Conjunctiva/sclera: Conjunctivae normal.     Pupils: Pupils are equal, round, and reactive to light.  Neck:     Thyroid: No thyroid mass, thyromegaly or thyroid tenderness.     Vascular: No carotid bruit or JVD.     Trachea: Trachea and phonation normal.  Cardiovascular:     Rate and Rhythm: Normal rate and regular rhythm.     Chest Wall: PMI is not displaced.     Pulses: Normal pulses.     Heart sounds: Normal heart sounds. No murmur. No friction rub. No gallop.   Pulmonary:     Effort: Pulmonary effort is normal. No respiratory distress.     Breath sounds: Normal breath sounds. No wheezing.  Abdominal:     General: Bowel sounds are normal. There is no distension or abdominal bruit.     Palpations: Abdomen is soft. There is no hepatomegaly or splenomegaly.     Tenderness: There is no abdominal tenderness. There is no right CVA tenderness or left CVA tenderness.     Hernia: No hernia is present.  Musculoskeletal:         General: Normal range of motion.     Cervical back: Normal range of motion and neck supple.     Right lower leg: No edema.     Left lower leg: No edema.  Lymphadenopathy:     Cervical: No cervical adenopathy.  Skin:    General: Skin is warm and dry.     Capillary Refill: Capillary refill takes less than 2 seconds.     Coloration: Skin is not cyanotic, jaundiced or pale.     Findings: No rash.  Neurological:     General: No focal deficit present.     Mental Status: She is alert and oriented to person, place, and time.     Cranial Nerves: Cranial nerves are intact. No cranial nerve deficit or dysarthria.     Sensory: Sensation is intact. No sensory deficit.     Motor: Motor function is intact. No weakness or tremor.     Coordination: Coordination is intact. Romberg sign negative. Coordination normal.     Gait: Gait is intact. Gait normal.     Deep Tendon Reflexes: Reflexes are normal and symmetric. Reflexes normal.  Psychiatric:  Attention and Perception: Attention and perception normal.        Mood and Affect: Mood normal. Affect is blunt and flat.        Speech: Speech normal.        Behavior: Behavior is withdrawn. Behavior is cooperative.        Thought Content: Thought content normal.        Cognition and Memory: Cognition normal. Memory is impaired.        Judgment: Judgment normal.     Results for orders placed or performed in visit on 05/20/19  Lipid panel  Result Value Ref Range   Cholesterol, Total WILL FOLLOW    Triglycerides WILL FOLLOW    HDL WILL FOLLOW    VLDL Cholesterol Cal WILL FOLLOW    LDL Chol Calc (NIH) WILL FOLLOW    Lipid Comment: WILL FOLLOW    Chol/HDL Ratio WILL FOLLOW   CMP14+EGFR  Result Value Ref Range   Glucose WILL FOLLOW    BUN WILL FOLLOW    Creatinine, Ser WILL FOLLOW    GFR calc non Af Amer WILL FOLLOW    GFR calc Af Amer WILL FOLLOW    BUN/Creatinine Ratio WILL FOLLOW    Sodium WILL FOLLOW    Potassium WILL FOLLOW     Chloride WILL FOLLOW    CO2 WILL FOLLOW    Calcium WILL FOLLOW    Total Protein WILL FOLLOW    Albumin WILL FOLLOW    Globulin, Total WILL FOLLOW    Albumin/Globulin Ratio WILL FOLLOW    Bilirubin Total WILL FOLLOW    Alkaline Phosphatase WILL FOLLOW    AST WILL FOLLOW    ALT WILL FOLLOW   CBC with Differential/Platelet  Result Value Ref Range   WBC 9.5 3.4 - 10.8 x10E3/uL   RBC 4.59 3.77 - 5.28 x10E6/uL   Hemoglobin 14.3 11.1 - 15.9 g/dL   Hematocrit 42.0 34.0 - 46.6 %   MCV 92 79 - 97 fL   MCH 31.2 26.6 - 33.0 pg   MCHC 34.0 31.5 - 35.7 g/dL   RDW 12.6 11.7 - 15.4 %   Platelets 202 150 - 450 x10E3/uL   Neutrophils 50 Not Estab. %   Lymphs 38 Not Estab. %   Monocytes 8 Not Estab. %   Eos 3 Not Estab. %   Basos 1 Not Estab. %   Neutrophils Absolute 4.8 1.4 - 7.0 x10E3/uL   Lymphocytes Absolute 3.6 (H) 0.7 - 3.1 x10E3/uL   Monocytes Absolute 0.8 0.1 - 0.9 x10E3/uL   EOS (ABSOLUTE) 0.3 0.0 - 0.4 x10E3/uL   Basophils Absolute 0.1 0.0 - 0.2 x10E3/uL   Immature Granulocytes 0 Not Estab. %   Immature Grans (Abs) 0.0 0.0 - 0.1 x10E3/uL  Thyroid Panel With TSH  Result Value Ref Range   TSH WILL FOLLOW    T4, Total WILL FOLLOW    T3 Uptake Ratio WILL FOLLOW    Free Thyroxine Index WILL FOLLOW   hgba1c  Result Value Ref Range   HB A1C (BAYER DCA - WAIVED) 7.1 (H) <7.0 %     EKG: A-Fib with RBBB. No changes from previous EKG. No acute abnormalities. No ST elevation. Monia Pouch, FNP-C.  Pertinent labs & imaging results that were available during my care of the patient were reviewed by me and considered in my medical decision making.  Assessment & Plan:  Amber Stephenson was seen today for hospitalization follow-up.  Diagnoses and all orders for this visit:  Dizziness Likely due to polypharmacy. Pt to discuss tapering off of some of her medications with psychiatry, cardiology, and neurology. Will check below for possible underlying causes. EKG without changes from previous. No red  flags concerning for acute neurovascular changes as this has been ongoing for over a year. Pt will follow up with cardiology and neurology as discussed.  -     CMP14+EGFR -     CBC with Differential/Platelet -     Thyroid Panel With TSH -     EKG 12-Lead  Benign paroxysmal positional vertigo due to bilateral vestibular disorder Slow positional changes to avoid dizziness and falling. Report any new, worsening, or persistent symptoms. Follow up with neurology as discussed.  -     CMP14+EGFR -     CBC with Differential/Platelet  Type 2 diabetes mellitus with other specified complication, with long-term current use of insulin (HCC) A1C 7.1 today.  Changes were not made in regimen. Diet and exercise encouraged. Follow up in 3 months.  -     CMP14+EGFR -     hgba1c  Hypertension associated with diabetes ([place]) BP well controlled. Changes were not made in regimen today. Goal BP is 130/80. Pt aware to report any persistent high or low readings. DASH diet and exercise encouraged. Exercise at least 150 minutes per week and increase as tolerated. Goal BMI > 25. Stress management encouraged. Avoid nicotine and tobacco product use. Avoid excessive alcohol and NSAID's. Avoid more than 2000 mg of sodium daily. Medications as prescribed. Follow up as scheduled.  -     CMP14+EGFR -     CBC with Differential/Platelet -     Thyroid Panel With TSH -     EKG 12-Lead  Hyperlipidemia associated with type 2 diabetes mellitus ([place]) Diet encouraged - increase intake of fresh fruits and vegetables, increase intake of lean proteins. Bake, broil, or grill foods. Avoid fried, greasy, and fatty foods. Avoid fast foods. Increase intake of fiber-rich whole grains. Exercise encouraged - at least 150 minutes per week and advance as tolerated.  Goal BMI < 25. Continue medications as prescribed. Follow up in 3-6 months as discussed.  -     Lipid panel -     atorvastatin (LIPITOR) 40 MG tablet; Take 1 tablet (40 mg total) by  mouth daily.  Morbid obesity ([place]) Diet and exercise encouraged. Labs pending.  -     Lipid panel -     CMP14+EGFR -     CBC with Differential/Platelet -     Thyroid Panel With TSH -     hgba1c  Polypharmacy Pt needs to follow up with psychiatry, cardiology, and neurology to see if she can safely be tapered off of some of her medications as this is likely the cause of her ongoing dizziness. Long discussion about risks of polypharmacy.    Total time spent with patient 45 minutes.  Greater than 50% of encounter spent in coordination of care/counseling.  Continue all other maintenance medications.  Follow up plan: Return in about 3 months (around 08/18/2019), or if symptoms worsen or fail to improve, for DM, HTN, Lipids.  Continue healthy lifestyle choices, including diet (rich in fruits, vegetables, and lean proteins, and low in salt and simple carbohydrates) and exercise (at least 30 minutes of moderate physical activity daily).  Educational handout given for vertigo   The above assessment and management plan was discussed with the patient. The patient verbalized understanding of and has agreed to the management plan. Patient is  aware to call the clinic if they develop any new symptoms or if symptoms persist or worsen. Patient is aware when to return to the clinic for a follow-up visit. Patient educated on when it is appropriate to go to the emergency department.   Monia Pouch, FNP-C [facility] [phone]

## 2019-05-20 NOTE — Progress Notes (Signed)
No acute changes from previous EKG. A Fib with RBBB. Pt aware during visit.

## 2019-05-20 NOTE — Progress Notes (Signed)
A1C 7.1, no changes in regimen. Diet and exercise along with current medications.

## 2019-05-20 NOTE — Patient Instructions (Addendum)
Vertigo Vertigo is the feeling that you or the things around you are moving when they are not. This feeling can come and go at any time. Vertigo often goes away on its own. This condition can be dangerous if it happens when you are doing activities like driving or working with machines. Your doctor will do tests to find the cause of your vertigo. These tests will also help your doctor decide on the best treatment for you. Follow these instructions at home: Eating and drinking      Drink enough fluid to keep your pee (urine) pale yellow.  Do not drink alcohol. Activity  Return to your normal activities as told by your doctor. Ask your doctor what activities are safe for you.  In the morning, first sit up on the side of the bed. When you feel okay, stand slowly while you hold onto something until you know that your balance is fine.  Move slowly. Avoid sudden body or head movements or certain positions, as told by your doctor.  Use a cane if you have trouble standing or walking.  Sit down right away if you feel dizzy.  Avoid doing any tasks or activities that can cause danger to you or others if you get dizzy.  Avoid bending down if you feel dizzy. Place items in your home so that they are easy for you to reach without leaning over.  Do not drive or use heavy machinery if you feel dizzy. General instructions  Take over-the-counter and prescription medicines only as told by your doctor.  Keep all follow-up visits as told by your doctor. This is important. Contact a doctor if:  Your medicine does not help your vertigo.  You have a fever.  Your problems get worse or you have new symptoms.  Your family or friends see changes in your behavior.  The feeling of being sick to your stomach gets worse.  Your vomiting gets worse.  You lose feeling (have numbness) in part of your body.  You feel prickling and tingling in a part of your body. Get help right away if:  You have  trouble moving or talking.  You are always dizzy.  You pass out (faint).  You get very bad headaches.  You feel weak in your hands, arms, or legs.  You have changes in your hearing.  You have changes in how you see (vision).  You get a stiff neck.  Bright light starts to bother you. Summary  Vertigo is the feeling that you or the things around you are moving when they are not.  Your doctor will do tests to find the cause of your vertigo.  You may be told to avoid some tasks, positions, or movements.  Contact a doctor if your medicine is not helping, or if you have a fever, new symptoms, or a change in behavior.  Get help right away if you get very bad headaches, or if you have changes in how you speak, hear, or see. This information is not intended to replace advice given to you by your health care provider. Make sure you discuss any questions you have with your health care provider. Document Released: 02/26/2008 Document Revised: 04/12/2018 Document Reviewed: 04/12/2018 Elsevier Patient Education  [place]. vertigo

## 2019-05-21 DIAGNOSIS — Z860101 Personal history of adenomatous and serrated colon polyps: Secondary | ICD-10-CM

## 2019-05-21 DIAGNOSIS — Z86010 Personal history of colonic polyps: Secondary | ICD-10-CM

## 2019-05-21 DIAGNOSIS — Z79899 Other long term (current) drug therapy: Secondary | ICD-10-CM | POA: Insufficient documentation

## 2019-05-21 HISTORY — DX: Personal history of colonic polyps: Z86.010

## 2019-05-21 HISTORY — DX: Personal history of adenomatous and serrated colon polyps: Z86.0101

## 2019-05-21 LAB — LIPID PANEL
Chol/HDL Ratio: 3.7 ratio (ref 0.0–4.4)
Cholesterol, Total: 150 mg/dL (ref 100–199)
HDL: 41 mg/dL (ref >39)
LDL Chol Calc (NIH): 83 mg/dL (ref 0–99)
Triglycerides: 149 mg/dL (ref 0–149)
VLDL Cholesterol Cal: 26 mg/dL (ref 5–40)

## 2019-05-21 LAB — CMP14+EGFR
ALT: 12 IU/L (ref 0–32)
AST: 16 IU/L (ref 0–40)
Albumin/Globulin Ratio: 1.6 (ref 1.2–2.2)
Albumin: 4.1 g/dL (ref 3.7–4.7)
Alkaline Phosphatase: 115 IU/L (ref 39–117)
BUN/Creatinine Ratio: 17 (ref 12–28)
BUN: 17 mg/dL (ref 8–27)
Bilirubin Total: 0.2 mg/dL (ref 0.0–1.2)
CO2: 24 mmol/L (ref 20–29)
Calcium: 9.5 mg/dL (ref 8.7–10.3)
Chloride: 105 mmol/L (ref 96–106)
Creatinine, Ser: 0.99 mg/dL (ref 0.57–1.00)
GFR calc Af Amer: 66 mL/min/{1.73_m2} (ref >59)
GFR calc non Af Amer: 58 mL/min/{1.73_m2} — ABNORMAL LOW (ref >59)
Globulin, Total: 2.6 g/dL (ref 1.5–4.5)
Glucose: 120 mg/dL — ABNORMAL HIGH (ref 65–99)
Potassium: 4.4 mmol/L (ref 3.5–5.2)
Sodium: 141 mmol/L (ref 134–144)
Total Protein: 6.7 g/dL (ref 6.0–8.5)

## 2019-05-21 LAB — THYROID PANEL WITH TSH
Free Thyroxine Index: 1.5 (ref 1.2–4.9)
T3 Uptake Ratio: 24 % (ref 24–39)
T4, Total: 6.1 ug/dL (ref 4.5–12.0)
TSH: 2.02 u[IU]/mL (ref 0.450–4.500)

## 2019-05-21 LAB — CBC WITH DIFFERENTIAL/PLATELET
Basophils Absolute: 0.1 10*3/uL (ref 0.0–0.2)
Basos: 1 %
EOS (ABSOLUTE): 0.3 10*3/uL (ref 0.0–0.4)
Eos: 3 %
Hematocrit: 42 % (ref 34.0–46.6)
Hemoglobin: 14.3 g/dL (ref 11.1–15.9)
Immature Grans (Abs): 0 10*3/uL (ref 0.0–0.1)
Immature Granulocytes: 0 %
Lymphocytes Absolute: 3.6 10*3/uL — ABNORMAL HIGH (ref 0.7–3.1)
Lymphs: 38 %
MCH: 31.2 pg (ref 26.6–33.0)
MCHC: 34 g/dL (ref 31.5–35.7)
MCV: 92 fL (ref 79–97)
Monocytes Absolute: 0.8 10*3/uL (ref 0.1–0.9)
Monocytes: 8 %
Neutrophils Absolute: 4.8 10*3/uL (ref 1.4–7.0)
Neutrophils: 50 %
Platelets: 202 10*3/uL (ref 150–450)
RBC: 4.59 x10E6/uL (ref 3.77–5.28)
RDW: 12.6 % (ref 11.7–15.4)
WBC: 9.5 10*3/uL (ref 3.4–10.8)

## 2019-05-23 NOTE — Progress Notes (Signed)
Cholesterol is normal. Thyroid function is normal. Glucose is elevated at 120, need to limit sugary foods and beverages and diet.  Liver and kidney functions are normal.  CBC is normal.

## 2019-06-06 ENCOUNTER — Ambulatory Visit (INDEPENDENT_AMBULATORY_CARE_PROVIDER_SITE_OTHER): Payer: Medicare Other | Admitting: Family Medicine

## 2019-06-06 ENCOUNTER — Encounter: Payer: Self-pay | Admitting: Family Medicine

## 2019-06-06 DIAGNOSIS — L282 Other prurigo: Secondary | ICD-10-CM | POA: Diagnosis not present

## 2019-06-06 MED ORDER — PREDNISONE 20 MG PO TABS: ORAL_TABLET | ORAL | 0 refills | Status: DC

## 2019-06-06 NOTE — Progress Notes (Signed)
Virtual Visit via telephone Note Due to COVID-19 pandemic this visit was conducted virtually. This visit type was conducted due to national recommendations for restrictions regarding the COVID-19 Pandemic (e.g. social distancing, sheltering in place) in an effort to limit this patient's exposure and mitigate transmission in our community. All issues noted in this document were discussed and addressed.  A physical exam was not performed with this format.   I connected with Amber Stephenson on 06/06/2019 at [place] by telephone and verified that I am speaking with the correct person using two identifiers. Amber Stephenson is currently located at home and family is currently with them during visit. The provider, Monia Pouch, FNP is located in their office at time of visit.  I discussed the limitations, risks, security and privacy concerns of performing an evaluation and management service by telephone and the availability of in person appointments. I also discussed with the patient that there may be a patient responsible charge related to this service. The patient expressed understanding and agreed to proceed.  Subjective:  Patient ID: Amber Stephenson, female    DOB: May 10, 1946, 74 y.o.   MRN: [id number]  Chief Complaint:  Rash   HPI: Amber Stephenson is a 74 y.o. female presenting on 06/06/2019 for Rash   Pt reports a pruritic, burning rash to her mid back. No known exposures. No significant lesions.   Rash This is a new problem. The current episode started 1 to 4 weeks ago. The problem has been waxing and waning since onset. The affected locations include the back. The rash is characterized by burning, itchiness and redness. She was exposed to nothing. Pertinent negatives include no anorexia, congestion, cough, diarrhea, eye pain, facial edema, fatigue, fever, joint pain, nail changes, rhinorrhea, shortness of breath, sore throat or vomiting. Past treatments include anti-itch cream. The  treatment provided no relief.     Relevant past medical, surgical, family, and social history reviewed and updated as indicated.  Allergies and medications reviewed and updated.   Past Medical History:  Diagnosis Date  . Atrial fibrillation with RVR 05/19/2017  . Bipolar I disorder 01/23/2015  . CAD (coronary artery disease)    Nonobstructive; Managed by Dr. Bronson Ing  . Cardiomegaly 01/12/2018  . Chronic anticoagulation 05/30/2015  . Chronic back pain    Lower back  . Chronic diastolic CHF (congestive heart failure) 05/30/2015  . Diabetes mellitus, type 2, without complication   . Diabetic neuropathy 02/06/2016  . Essential hypertension   . Fibromyalgia 03/19/2017  . Herpes genitalis in women 07/16/2015  . Hyperlipidemia   . Hypertension   . Hypoxia 02/01/2019  . Insomnia 01/23/2015  . Lipoma 02/08/2015  . Major depressive disorder   . Migraine headache with aura 02/12/2016  . 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[place]) 04/21/2019  . NAFL (nonalcoholic fatty liver) 123XX123  . OSA (obstructive sleep apnea) 02/24/2019   10/09/2018 - HST  - AHI 40.6   . Pulmonary hypertension   . Renal insufficiency    Managed by Dr. Wallace Keller  . RLS (restless legs syndrome) 04/27/2015    Past Surgical History:  Procedure Laterality Date  . BREAST REDUCTION SURGERY    . EYE SURGERY Right    cateracts  . HAMMER TOE SURGERY    . LEFT HEART CATH AND CORONARY ANGIOGRAPHY N/A 02/03/2018   Procedure: LEFT HEART CATH AND CORONARY ANGIOGRAPHY;  Surgeon: Martinique, Peter M, MD;  Location: [facility];  Service: Cardiovascular;  Laterality: N/A;  . SHOULDER SURGERY  Right    "I BROKE [place]  . THIGH SURGERY     "TO REMOVE A TUMOR "    Social History   Socioeconomic History  . Marital status: Married    Spouse name: Orpah Greek  . Number of children: 3  . Years of education: 15  . Highest education level: Associate degree: academic program  Occupational History  . Occupation: Retired  Tobacco  Use  . Smoking status: Never Smoker  . Smokeless tobacco: Never Used  Substance and Sexual Activity  . Alcohol use: No    Alcohol/week: 0.0 standard drinks  . Drug use: No  . Sexual activity: Yes    Partners: Male    Birth control/protection: Post-menopausal  Other Topics Concern  . Not on file  Social History Narrative   Lives at home with husband.       Right handed   Social Determinants of Health   Financial Resource Strain:   . Difficulty of Paying Living Expenses: Not on file  Food Insecurity:   . Worried About Charity fundraiser in the Last Year: Not on file  . Ran Out of Food in the Last Year: Not on file  Transportation Needs:   . Lack of Transportation (Medical): Not on file  . Lack of Transportation (Non-Medical): Not on file  Physical Activity:   . Days of Exercise per Week: Not on file  . Minutes of Exercise per Session: Not on file  Stress:   . Feeling of Stress : Not on file  Social Connections:   . Frequency of Communication with Friends and Family: Not on file  . Frequency of Social Gatherings with Friends and Family: Not on file  . Attends Religious Services: Not on file  . Active Member of Clubs or Organizations: Not on file  . Attends Archivist Meetings: Not on file  . Marital Status: Not on file  Intimate Partner Violence:   . Fear of Current or Ex-Partner: Not on file  . Emotionally Abused: Not on file  . Physically Abused: Not on file  . Sexually Abused: Not on file    Outpatient Encounter Medications as of 06/06/2019  Medication Sig  . acetaminophen (TYLENOL) 325 MG tablet Take 325-650 mg by mouth every 6 (six) hours as needed for mild pain, moderate pain or headache.  Marland Kitchen apixaban (ELIQUIS) 5 MG TABS tablet Take 1 tablet (5 mg total) by mouth 2 (two) times daily.  . ARIPiprazole (ABILIFY) 10 MG tablet Take 3 tablets (30 mg total) by mouth daily. (Patient taking differently: Take 10 mg by mouth 2 (two) times daily. )  . atorvastatin  (LIPITOR) 40 MG tablet Take 1 tablet (40 mg total) by mouth daily.  Marland Kitchen atorvastatin (LIPITOR) 40 MG tablet TAKE 1 TABLET(40 MG) BY MOUTH DAILY  . budesonide-formoterol (SYMBICORT) 80-4.5 MCG/ACT inhaler Inhale 2 puffs into the lungs 2 (two) times daily. (Patient taking differently: Inhale 2 puffs into the lungs 2 (two) times daily as needed (shortness of breath). )  . carbamazepine (CARBATROL) 100 MG 12 hr capsule Take 1 capsule (100 mg total) by mouth daily.  . Cholecalciferol (VITAMIN D3) 1000 UNITS CAPS Take 1,000 Units by mouth daily.  Marland Kitchen diltiazem (CARDIZEM CD) 120 MG 24 hr capsule TAKE 1 CAPSULE AT BEDTIME (Patient taking differently: Take 120 mg by mouth at bedtime. )  . Dulaglutide (TRULICITY) 1.5 0000000 SOPN Inject 1.5 mg into the skin once a week.  . Eszopiclone (ESZOPICLONE) 3 MG TABS Take 1  tablet (3 mg total) by mouth at bedtime as needed. Take immediately before bedtime (Patient taking differently: Take 3 mg by mouth at bedtime as needed (sleep). Take immediately before bedtime)  . furosemide (LASIX) 40 MG tablet Take 1 tablet (40 mg total) by mouth every morning.  Marland Kitchen ibuprofen (ADVIL) 800 MG tablet TAKE 1 TABLET BY MOUTH EVERY 8 HOURS AS NEEDED( USE SPARINGLY WITH BEING ON ELIQUIS)  . Insulin Glargine (LANTUS SOLOSTAR) 100 UNIT/ML Solostar Pen INJECT 60 UNITS UNDER THE SKIN TWICE A DAY (Patient taking differently: Inject 60 Units into the skin 2 (two) times daily. )  . LORazepam (ATIVAN) 1 MG tablet Take 1-2 tablets (1-2 mg total) by mouth See admin instructions. Take 1 tab (1 mg) in the morning and 2 tabs (2 mg ) at bedtime  . metoprolol tartrate (LOPRESSOR) 100 MG tablet Take 1 tablet (100 mg total) by mouth 2 (two) times daily. Take 100mg  + 25mg  for a total of 125mg  twice a day.  . metoprolol tartrate (LOPRESSOR) 25 MG tablet TAKE 1 TABLET TWICE A DAY (TO BE TAKEN ALONG WITH THE LOPRESSOR 100 MG TWICE A DAY)  . naloxone (NARCAN) nasal spray 4 mg/0.1 mL Place 1 spray into the nose once  as needed (opiod overdose).  Marland Kitchen oxyCODONE-acetaminophen (PERCOCET/ROXICET) 5-325 MG tablet   . potassium chloride SA (KLOR-CON) 20 MEQ tablet Take 1 tablet (20 mEq total) by mouth daily.  . predniSONE (DELTASONE) 20 MG tablet 2 po at sametime daily for 5 days  . pregabalin (LYRICA) 150 MG capsule Take 1 capsule (150 mg total) by mouth 2 (two) times daily.  Marland Kitchen PROAIR HFA 108 (90 Base) MCG/ACT inhaler Inhale 1-2 puffs into the lungs every 6 (six) hours as needed. As needed  . SUMAtriptan (IMITREX) 100 MG tablet Take 1 tablet (100 mg total) by mouth every 2 (two) hours as needed for migraine. May repeat in 2 hours if headache persists or recurs.  . topiramate (TOPAMAX) 25 MG tablet Take 2 tablets every night (Patient taking differently: Take 50 mg by mouth at bedtime. One in AM Take 3 tablets every night)  . traZODone (DESYREL) 100 MG tablet Take 4 tablets (400 mg total) by mouth at bedtime.  Marland Kitchen VASCEPA 1 g CAPS TAKE 2 CAPSULES TWICE A DAY WITH MEALS   Facility-Administered Encounter Medications as of 06/06/2019  Medication  . bupivacaine-epinephrine (MARCAINE W/ EPI) 0.5% -1:200000 injection    Allergies  Allergen Reactions  . Ivp Dye [Iodinated Diagnostic Agents] Anaphylaxis and Swelling    Throat closes  . Latex     Latex tape pulls skin with it    Review of Systems  Constitutional: Negative for activity change, appetite change, chills, diaphoresis, fatigue, fever and unexpected weight change.  HENT: Negative.  Negative for congestion, rhinorrhea, sore throat, trouble swallowing and voice change.   Eyes: Negative.  Negative for photophobia, pain and visual disturbance.  Respiratory: Negative for apnea, cough, choking, chest tightness, shortness of breath, wheezing and stridor.   Cardiovascular: Negative for chest pain, palpitations and leg swelling.  Gastrointestinal: Negative for abdominal pain, anorexia, blood in stool, constipation, diarrhea, nausea and vomiting.  Endocrine: Negative.     Genitourinary: Negative for decreased urine volume, difficulty urinating, dysuria, frequency and urgency.  Musculoskeletal: Negative for arthralgias, back pain, joint pain and myalgias.  Skin: Positive for color change and rash. Negative for nail changes, pallor and wound.  Allergic/Immunologic: Negative.   Neurological: Negative for dizziness, tremors, syncope, facial asymmetry, speech difficulty, weakness, light-headedness,  numbness and headaches.  Hematological: Negative.   Psychiatric/Behavioral: Negative for confusion, hallucinations, sleep disturbance and suicidal ideas.  All other systems reviewed and are negative.        Observations/Objective: No vital signs or physical exam, this was a telephone or virtual health encounter.  Pt alert and oriented, answers all questions appropriately, and able to speak in full sentences.    Assessment and Plan: Amber Stephenson was seen today for rash.  Diagnoses and all orders for this visit:  Pruritic rash Pruritic rash of unknown etiology. Symptomatic care discussed in detail. No red flags concerning for anaphylaxis. Will add burst of steroids. Report any new, worsening, or persistent symptoms.  -     predniSONE (DELTASONE) 20 MG tablet; 2 po at sametime daily for 5 days     Follow Up Instructions: Return if symptoms worsen or fail to improve.    I discussed the assessment and treatment plan with the patient. The patient was provided an opportunity to ask questions and all were answered. The patient agreed with the plan and demonstrated an understanding of the instructions.   The patient was advised to call back or seek an in-person evaluation if the symptoms worsen or if the condition fails to improve as anticipated.  The above assessment and management plan was discussed with the patient. The patient verbalized understanding of and has agreed to the management plan. Patient is aware to call the clinic if they develop any new symptoms or if  symptoms persist or worsen. Patient is aware when to return to the clinic for a follow-up visit. Patient educated on when it is appropriate to go to the emergency department.    I provided 15 minutes of non-face-to-face time during this encounter. The call started at [place]. The call ended at [place]. The other time was used for coordination of care.    Monia Pouch, FNP-C [facility] [address] [place], [place] [address] [phone] 06/06/2019

## 2019-06-09 DIAGNOSIS — H5010 Unspecified exotropia: Secondary | ICD-10-CM | POA: Diagnosis not present

## 2019-06-13 ENCOUNTER — Encounter: Payer: Self-pay | Admitting: *Deleted

## 2019-06-13 ENCOUNTER — Telehealth: Payer: Self-pay | Admitting: Cardiovascular Disease

## 2019-06-13 NOTE — Telephone Encounter (Signed)
Letter provided and pt notified

## 2019-06-13 NOTE — Telephone Encounter (Signed)
Pt left another message stating the letter is for her to receive the COVID vaccine

## 2019-06-13 NOTE — Telephone Encounter (Signed)
Left message requesting letter-- didn't give details for what, requesting someone to please call back

## 2019-06-14 ENCOUNTER — Ambulatory Visit (INDEPENDENT_AMBULATORY_CARE_PROVIDER_SITE_OTHER): Payer: Medicare Other | Admitting: Family

## 2019-06-14 ENCOUNTER — Encounter: Payer: Self-pay | Admitting: Family

## 2019-06-14 DIAGNOSIS — L282 Other prurigo: Secondary | ICD-10-CM | POA: Diagnosis not present

## 2019-06-14 MED ORDER — TRIAMCINOLONE ACETONIDE 0.5 % EX OINT: 1.0000 "application " | TOPICAL_OINTMENT | Freq: Two times a day (BID) | CUTANEOUS | 0 refills | Status: DC

## 2019-06-14 NOTE — Progress Notes (Signed)
   Virtual Visit via telephone Note Due to COVID-19 pandemic this visit was conducted virtually. This visit type was conducted due to national recommendations for restrictions regarding the COVID-19 Pandemic (e.g. social distancing, sheltering in place) in an effort to limit this patient's exposure and mitigate transmission in our community. All issues noted in this document were discussed and addressed.  A physical exam was not performed with this format.  I connected with Amber Stephenson on 06/14/19 at 12:25 pm  by telephone and verified that I am speaking with the correct person using two identifiers. Amber Stephenson is currently located at mother's in law and husband and mother-in-law is currently with her during visit. The provider, Evelina Dun, FNP is located in their office at time of visit.  I discussed the limitations, risks, security and privacy concerns of performing an evaluation and management service by telephone and the availability of in person appointments. I also discussed with the patient that there may be a patient responsible charge related to this service. The patient expressed understanding and agreed to proceed.   History and Present Illness:  Pt calls the office today with pruritic rash that started three weeks ago on her back, stomach,and legs. She had a visit on 06/06/19 and was given prednisone which greatly helped. She reports now the only itching is on her chest.   No new medications, skin products, insect bites, or detergents.  Rash This is a new problem. The current episode started 1 to 4 weeks ago. The problem has been gradually improving since onset. The affected locations include the chest. The rash is characterized by itchiness. She was exposed to nothing. Pertinent negatives include no congestion, cough, fatigue, fever, shortness of breath or sore throat. Past treatments include oral steroids. The treatment provided mild relief.       Review of Systems    Constitutional: Negative for fatigue and fever.  HENT: Negative for congestion and sore throat.   Respiratory: Negative for cough and shortness of breath.   Skin: Positive for rash.  All other systems reviewed and are negative.    Observations/Objective: No SOB or distress noted   Assessment and Plan: 1. Pruritic rash Apply Kenalog BID Avoid applying on face or groin Call if symptoms worsen or do not improve - triamcinolone ointment (KENALOG) 0.5 %; Apply 1 application topically 2 (two) times daily.  Dispense: 30 g; Refill: 0      I discussed the assessment and treatment plan with the patient. The patient was provided an opportunity to ask questions and all were answered. The patient agreed with the plan and demonstrated an understanding of the instructions.   The patient was advised to call back or seek an in-person evaluation if the symptoms worsen or if the condition fails to improve as anticipated.  The above assessment and management plan was discussed with the patient. The patient verbalized understanding of and has agreed to the management plan. Patient is aware to call the clinic if symptoms persist or worsen. Patient is aware when to return to the clinic for a follow-up visit. Patient educated on when it is appropriate to go to the emergency department.   Time call ended:  10:37 pm  I provided 12 minutes of non-face-to-face time during this encounter.    Evelina Dun, FNP

## 2019-06-17 DIAGNOSIS — N183 Chronic kidney disease, stage 3 unspecified: Secondary | ICD-10-CM | POA: Diagnosis not present

## 2019-06-17 DIAGNOSIS — E1122 Type 2 diabetes mellitus with diabetic chronic kidney disease: Secondary | ICD-10-CM | POA: Diagnosis not present

## 2019-06-17 DIAGNOSIS — Z794 Long term (current) use of insulin: Secondary | ICD-10-CM | POA: Diagnosis not present

## 2019-06-17 DIAGNOSIS — E1165 Type 2 diabetes mellitus with hyperglycemia: Secondary | ICD-10-CM | POA: Diagnosis not present

## 2019-06-17 DIAGNOSIS — E1140 Type 2 diabetes mellitus with diabetic neuropathy, unspecified: Secondary | ICD-10-CM | POA: Diagnosis not present

## 2019-06-20 DIAGNOSIS — E1165 Type 2 diabetes mellitus with hyperglycemia: Secondary | ICD-10-CM | POA: Diagnosis not present

## 2019-06-20 DIAGNOSIS — M545 Low back pain: Secondary | ICD-10-CM | POA: Diagnosis not present

## 2019-06-20 DIAGNOSIS — Z79899 Other long term (current) drug therapy: Secondary | ICD-10-CM | POA: Diagnosis not present

## 2019-06-20 DIAGNOSIS — M5137 Other intervertebral disc degeneration, lumbosacral region: Secondary | ICD-10-CM | POA: Diagnosis not present

## 2019-06-20 DIAGNOSIS — M419 Scoliosis, unspecified: Secondary | ICD-10-CM | POA: Diagnosis not present

## 2019-06-27 ENCOUNTER — Ambulatory Visit (INDEPENDENT_AMBULATORY_CARE_PROVIDER_SITE_OTHER): Payer: Medicare Other | Admitting: Family Medicine

## 2019-06-27 ENCOUNTER — Other Ambulatory Visit: Payer: Self-pay

## 2019-06-27 VITALS — BP 100/62 | HR 62 | Temp 98.4°F | Ht 62.0 in | Wt 203.0 lb

## 2019-06-27 DIAGNOSIS — S80921A Unspecified superficial injury of right lower leg, initial encounter: Secondary | ICD-10-CM

## 2019-06-27 DIAGNOSIS — T148XXA Other injury of unspecified body region, initial encounter: Secondary | ICD-10-CM

## 2019-06-27 NOTE — Patient Instructions (Signed)
Use the xeroform bandages that I gave you once daily to help promote wound healing.  If you are going out, make sure to protect that area with the Ace bandage (brown bandage).  If it looks red, swollen or gets bigger, please return for recheck.  Wound Care, Adult Taking care of your wound properly can help to prevent pain, infection, and scarring. It can also help your wound to heal more quickly. How to care for your wound Wound care      Follow instructions from your health care provider about how to take care of your wound. Make sure you: ? Wash your hands with soap and water before you change the bandage (dressing). If soap and water are not available, use hand sanitizer. ? Change your dressing as told by your health care provider. ? Leave stitches (sutures), skin glue, or adhesive strips in place. These skin closures may need to stay in place for 2 weeks or longer. If adhesive strip edges start to loosen and curl up, you may trim the loose edges. Do not remove adhesive strips completely unless your health care provider tells you to do that.  Check your wound area every day for signs of infection. Check for: ? Redness, swelling, or pain. ? Fluid or blood. ? Warmth. ? Pus or a bad smell.  Ask your health care provider if you should clean the wound with mild soap and water. Doing this may include: ? Using a clean towel to pat the wound dry after cleaning it. Do not rub or scrub the wound. ? Applying a cream or ointment. Do this only as told by your health care provider. ? Covering the incision with a clean dressing.  Ask your health care provider when you can leave the wound uncovered.  Keep the dressing dry until your health care provider says it can be removed. Do not take baths, swim, use a hot tub, or do anything that would put the wound underwater until your health care provider approves. Ask your health care provider if you can take showers. You may only be allowed to take sponge  baths. Medicines   If you were prescribed an antibiotic medicine, cream, or ointment, take or use the antibiotic as told by your health care provider. Do not stop taking or using the antibiotic even if your condition improves.  Take over-the-counter and prescription medicines only as told by your health care provider. If you were prescribed pain medicine, take it 30 or more minutes before you do any wound care or as told by your health care provider. General instructions  Return to your normal activities as told by your health care provider. Ask your health care provider what activities are safe.  Do not scratch or pick at the wound.  Do not use any products that contain nicotine or tobacco, such as cigarettes and e-cigarettes. These may delay wound healing. If you need help quitting, ask your health care provider.  Keep all follow-up visits as told by your health care provider. This is important.  Eat a diet that includes protein, vitamin A, vitamin C, and other nutrient-rich foods to help the wound heal. ? Foods rich in protein include meat, dairy, beans, nuts, and other sources. ? Foods rich in vitamin A include carrots and dark green, leafy vegetables. ? Foods rich in vitamin C include citrus, tomatoes, and other fruits and vegetables. ? Nutrient-rich foods have protein, carbohydrates, fat, vitamins, or minerals. Eat a variety of healthy foods including vegetables, fruits,  and whole grains. Contact a health care provider if:  You received a tetanus shot and you have swelling, severe pain, redness, or bleeding at the injection site.  Your pain is not controlled with medicine.  You have redness, swelling, or pain around the wound.  You have fluid or blood coming from the wound.  Your wound feels warm to the touch.  You have pus or a bad smell coming from the wound.  You have a fever or chills.  You are nauseous or you vomit.  You are dizzy. Get help right away if:  You  have a red streak going away from your wound.  The edges of the wound open up and separate.  Your wound is bleeding, and the bleeding does not stop with gentle pressure.  You have a rash.  You faint.  You have trouble breathing. Summary  Always wash your hands with soap and water before changing your bandage (dressing).  To help with healing, eat foods that are rich in protein, vitamin A, vitamin C, and other nutrients.  Check your wound every day for signs of infection. Contact your health care provider if you suspect that your wound is infected. This information is not intended to replace advice given to you by your health care provider. Make sure you discuss any questions you have with your health care provider. Document Revised: 09/06/2018 Document Reviewed: 12/04/2015 Elsevier Patient Education  [place].

## 2019-06-27 NOTE — Progress Notes (Signed)
Subjective: CC: Leg wound PCP: Baruch Gouty, FNP [id number] D Moreno is a 74 y.o. female presenting to clinic today for:  1.  Leg wound Patient reports about a 33-month history of a small wound on the right anterior shin.  She wanted to get this checked out because she is a diabetic.  Denies any associated pain, drainage, bleeding.  It has not changed in size but it is not healing totally.  She does admit to having difficulty healing wounds due to uncontrolled diabetes.  She has not been using anything specific to that area but has been using a topical corticosteroid along the lateral aspect of her right calf for itching.  Does not recall any preceding injury.   ROS: Per HPI  Allergies  Allergen Reactions  . Ivp Dye [Iodinated Diagnostic Agents] Anaphylaxis and Swelling    Throat closes  . Latex     Latex tape pulls skin with it   Past Medical History:  Diagnosis Date  . Atrial fibrillation with RVR 05/19/2017  . Bipolar I disorder 01/23/2015  . CAD (coronary artery disease)    Nonobstructive; Managed by Dr. Bronson Ing  . Cardiomegaly 01/12/2018  . Chronic anticoagulation 05/30/2015  . Chronic back pain    Lower back  . Chronic diastolic CHF (congestive heart failure) 05/30/2015  . Diabetes mellitus, type 2, without complication   . Diabetic neuropathy 02/06/2016  . Essential hypertension   . Fibromyalgia 03/19/2017  . Herpes genitalis in women 07/16/2015  . Hyperlipidemia   . Hypertension   . Hypoxia 02/01/2019  . Insomnia 01/23/2015  . Lipoma 02/08/2015  . Major depressive disorder   . Migraine headache with aura 02/12/2016  . 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[address]) 04/21/2019  . NAFL (nonalcoholic fatty liver) 123XX123  . OSA (obstructive sleep apnea) 02/24/2019   10/09/2018 - HST  - AHI 40.6   . Pulmonary hypertension   . Renal insufficiency    Managed by Dr. Wallace Keller  . RLS (restless legs syndrome) 04/27/2015    Current Outpatient Medications:  .  acetaminophen  (TYLENOL) 325 MG tablet, Take 325-650 mg by mouth every 6 (six) hours as needed for mild pain, moderate pain or headache., Disp: , Rfl:  .  apixaban (ELIQUIS) 5 MG TABS tablet, Take 1 tablet (5 mg total) by mouth 2 (two) times daily., Disp: 180 tablet, Rfl: 1 .  ARIPiprazole (ABILIFY) 10 MG tablet, Take 3 tablets (30 mg total) by mouth daily. (Patient taking differently: Take 10 mg by mouth 2 (two) times daily. ), Disp: 270 tablet, Rfl: 2 .  atorvastatin (LIPITOR) 40 MG tablet, Take 1 tablet (40 mg total) by mouth daily., Disp: 90 tablet, Rfl: 3 .  atorvastatin (LIPITOR) 40 MG tablet, TAKE 1 TABLET(40 MG) BY MOUTH DAILY, Disp: 90 tablet, Rfl: 1 .  budesonide-formoterol (SYMBICORT) 80-4.5 MCG/ACT inhaler, Inhale 2 puffs into the lungs 2 (two) times daily. (Patient taking differently: Inhale 2 puffs into the lungs 2 (two) times daily as needed (shortness of breath). ), Disp: 2 Inhaler, Rfl: 0 .  carbamazepine (CARBATROL) 100 MG 12 hr capsule, Take 1 capsule (100 mg total) by mouth daily., Disp: 90 capsule, Rfl: 1 .  Cholecalciferol (VITAMIN D3) 1000 UNITS CAPS, Take 1,000 Units by mouth daily., Disp: , Rfl:  .  diltiazem (CARDIZEM CD) 120 MG 24 hr capsule, TAKE 1 CAPSULE AT BEDTIME (Patient taking differently: Take 120 mg by mouth at bedtime. ), Disp: 90 capsule, Rfl: 3 .  Eszopiclone (ESZOPICLONE) 3 MG TABS, Take  1 tablet (3 mg total) by mouth at bedtime as needed. Take immediately before bedtime (Patient taking differently: Take 3 mg by mouth at bedtime as needed (sleep). Take immediately before bedtime), Disp: 90 tablet, Rfl: 1 .  furosemide (LASIX) 40 MG tablet, Take 1 tablet (40 mg total) by mouth every morning., Disp: 90 tablet, Rfl: 1 .  ibuprofen (ADVIL) 800 MG tablet, TAKE 1 TABLET BY MOUTH EVERY 8 HOURS AS NEEDED( USE SPARINGLY WITH BEING ON ELIQUIS), Disp: 30 tablet, Rfl: 1 .  Insulin Glargine (LANTUS SOLOSTAR) 100 UNIT/ML Solostar Pen, INJECT 60 UNITS UNDER THE SKIN TWICE A DAY (Patient taking  differently: Inject 60 Units into the skin 2 (two) times daily. ), Disp: 90 mL, Rfl: 0 .  LORazepam (ATIVAN) 1 MG tablet, Take 1-2 tablets (1-2 mg total) by mouth See admin instructions. Take 1 tab (1 mg) in the morning and 2 tabs (2 mg ) at bedtime, Disp: 90 tablet, Rfl: 4 .  metoprolol tartrate (LOPRESSOR) 100 MG tablet, Take 1 tablet (100 mg total) by mouth 2 (two) times daily. Take 100mg  + 25mg  for a total of 125mg  twice a day., Disp: , Rfl:  .  metoprolol tartrate (LOPRESSOR) 25 MG tablet, TAKE 1 TABLET TWICE A DAY (TO BE TAKEN ALONG WITH THE LOPRESSOR 100 MG TWICE A DAY), Disp: 180 tablet, Rfl: 1 .  naloxone (NARCAN) nasal spray 4 mg/0.1 mL, Place 1 spray into the nose once as needed (opiod overdose)., Disp: , Rfl:  .  oxyCODONE-acetaminophen (PERCOCET/ROXICET) 5-325 MG tablet, , Disp: , Rfl:  .  potassium chloride SA (KLOR-CON) 20 MEQ tablet, Take 1 tablet (20 mEq total) by mouth daily., Disp: 30 tablet, Rfl: 3 .  predniSONE (DELTASONE) 20 MG tablet, 2 po at sametime daily for 5 days, Disp: 10 tablet, Rfl: 0 .  pregabalin (LYRICA) 150 MG capsule, Take 1 capsule (150 mg total) by mouth 2 (two) times daily., Disp: 180 capsule, Rfl: 2 .  PROAIR HFA 108 (90 Base) MCG/ACT inhaler, Inhale 1-2 puffs into the lungs every 6 (six) hours as needed. As needed, Disp: , Rfl:  .  Semaglutide (RYBELSUS) 3 MG TABS, Take by mouth., Disp: , Rfl:  .  SUMAtriptan (IMITREX) 100 MG tablet, Take 1 tablet (100 mg total) by mouth every 2 (two) hours as needed for migraine. May repeat in 2 hours if headache persists or recurs., Disp: 10 tablet, Rfl: 5 .  topiramate (TOPAMAX) 25 MG tablet, Take 2 tablets every night (Patient taking differently: Take 50 mg by mouth at bedtime. One in AM Take 3 tablets every night), Disp: 60 tablet, Rfl: 11 .  traZODone (DESYREL) 100 MG tablet, Take 4 tablets (400 mg total) by mouth at bedtime., Disp: 360 tablet, Rfl: 1 .  triamcinolone ointment (KENALOG) 0.5 %, Apply 1 application topically  2 (two) times daily., Disp: 30 g, Rfl: 0 .  VASCEPA 1 g CAPS, TAKE 2 CAPSULES TWICE A DAY WITH MEALS, Disp: 360 capsule, Rfl: 3 No current facility-administered medications for this visit.  Facility-Administered Medications Ordered in Other Visits:  .  bupivacaine-epinephrine (MARCAINE W/ EPI) 0.5% -1:200000 injection, , , Anesthesia Intra-op, Effie Berkshire, MD, 12 mL at 01/21/19 0935 Social History   Socioeconomic History  . Marital status: Married    Spouse name: Orpah Greek  . Number of children: 3  . Years of education: 60  . Highest education level: Associate degree: academic program  Occupational History  . Occupation: Retired  Tobacco Use  . Smoking status:  Never Smoker  . Smokeless tobacco: Never Used  Substance and Sexual Activity  . Alcohol use: No    Alcohol/week: 0.0 standard drinks  . Drug use: No  . Sexual activity: Yes    Partners: Male    Birth control/protection: Post-menopausal  Other Topics Concern  . Not on file  Social History Narrative   Lives at home with husband.       Right handed   Social Determinants of Health   Financial Resource Strain:   . Difficulty of Paying Living Expenses: Not on file  Food Insecurity:   . Worried About Charity fundraiser in the Last Year: Not on file  . Ran Out of Food in the Last Year: Not on file  Transportation Needs:   . Lack of Transportation (Medical): Not on file  . Lack of Transportation (Non-Medical): Not on file  Physical Activity:   . Days of Exercise per Week: Not on file  . Minutes of Exercise per Session: Not on file  Stress:   . Feeling of Stress : Not on file  Social Connections:   . Frequency of Communication with Friends and Family: Not on file  . Frequency of Social Gatherings with Friends and Family: Not on file  . Attends Religious Services: Not on file  . Active Member of Clubs or Organizations: Not on file  . Attends Archivist Meetings: Not on file  . Marital Status: Not on file    Intimate Partner Violence:   . Fear of Current or Ex-Partner: Not on file  . Emotionally Abused: Not on file  . Physically Abused: Not on file  . Sexually Abused: Not on file   Family History  Problem Relation Age of Onset  . Diabetes Mother   . Heart disease Mother   . Heart disease Brother   . Heart disease Sister        CABG  . Alcohol abuse Sister   . Mental illness Brother   . Diabetes Brother     Objective: Office vital signs reviewed. BP 100/62   Pulse 62   Temp 98.4 F (36.9 C) (Temporal)   Ht 5\' 2"  (1.575 m)   Wt 203 lb (92.1 kg)   SpO2 98%   BMI 37.13 kg/m   Physical Examination:  General: Awake, alert, nontoxic, No acute distress MSK: Gait somewhat unsteady but does ambulate independently Skin: 1 mm wound noted along the right anterior mid shin.  There is no associated erythema, induration, warmth or fluctuance.  There is a central area of scab formation.  She has minimal tenderness to palpation around the lesion.  Assessment/ Plan: 74 y.o. female   1. Nonhealing nonsurgical wound Likely just a slowly healing wound given uncontrolled diabetes.  We discussed wound care.  I gave her Xeroform bandages to apply to the affected area to hopefully promote healing.  We discussed signs and symptoms of infection which would warrant evaluation.  Home care instructions were reviewed and handout was provided.  She will follow PCP as needed   No orders of the defined types were placed in this encounter.  No orders of the defined types were placed in this encounter.    [name], DO [place] [phone]

## 2019-07-01 ENCOUNTER — Ambulatory Visit (INDEPENDENT_AMBULATORY_CARE_PROVIDER_SITE_OTHER): Payer: Medicare Other | Admitting: Psychiatry

## 2019-07-01 ENCOUNTER — Other Ambulatory Visit: Payer: Self-pay

## 2019-07-01 DIAGNOSIS — G4700 Insomnia, unspecified: Secondary | ICD-10-CM

## 2019-07-01 DIAGNOSIS — F3130 Bipolar disorder, current episode depressed, mild or moderate severity, unspecified: Secondary | ICD-10-CM

## 2019-07-01 MED ORDER — ARIPIPRAZOLE 10 MG PO TABS: 10.0000 mg | ORAL_TABLET | Freq: Two times a day (BID) | ORAL | 5 refills | Status: DC

## 2019-07-01 MED ORDER — PREGABALIN 150 MG PO CAPS: 150.0000 mg | ORAL_CAPSULE | Freq: Two times a day (BID) | ORAL | 2 refills | Status: DC

## 2019-07-01 MED ORDER — CARBAMAZEPINE ER 100 MG PO CP12: 100.0000 mg | ORAL_CAPSULE | Freq: Every day | ORAL | 1 refills | Status: DC

## 2019-07-01 MED ORDER — TRAZODONE HCL 100 MG PO TABS: 400.0000 mg | ORAL_TABLET | Freq: Every day | ORAL | 1 refills | Status: DC

## 2019-07-01 NOTE — Progress Notes (Signed)
Patient ID: Amber Stephenson, female   DOB: [birth year], 74 y.o.   MRN: [id number] [facility] MD Progress Note .she's going to apply 07/01/2019 11:30 AM Amber Stephenson  MRN:  [id number] Subjective:  Doing well Principal Problem: Bipolar disorder manic type Diagnosis: Bipolar disorder  Today the patient is doing very well.  Her mood is stable.  She denies euphoria or irritability.  She denies daily depression.  She is sleeping and eating well.  She is got good energy.  She has no problems thinking and concentrating and she is a good sense of work.  She is not grandiose.  She is not suicidal.  She has no racing thinking.  Her speech is normal.  She is very appropriate.  The patient likes to read a lot.  She does not walk very much for exercise.  Her rib pain is completely gone.  She takes her medicines as prescribed.  She has no complaints about her medicines.  Her health is good.  Financially she is stable.  Her husband is doing well.  Patient has no evidence of psychosis.  She is functioning extremely well.  She has no signs of a viral infection.  She denies chest pain shortness of breath or any neurological symptoms.  Past Medical History:  Diagnosis Date  . Atrial fibrillation with RVR 05/19/2017  . Bipolar I disorder 01/23/2015  . CAD (coronary artery disease)    Nonobstructive; Managed by Dr. Bronson Ing  . Cardiomegaly 01/12/2018  . Chronic anticoagulation 05/30/2015  . Chronic back pain    Lower back  . Chronic diastolic CHF (congestive heart failure) 05/30/2015  . Diabetes mellitus, type 2, without complication   . Diabetic neuropathy 02/06/2016  . Essential hypertension   . Fibromyalgia 03/19/2017  . Herpes genitalis in women 07/16/2015  . Hyperlipidemia   . Hypertension   . Hypoxia 02/01/2019  . Insomnia 01/23/2015  . Lipoma 02/08/2015  . Major depressive disorder   . Migraine headache with aura 02/12/2016  . 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 NAFL (nonalcoholic  fatty liver) 123XX123  . OSA (obstructive sleep apnea) 02/24/2019   10/09/2018 - HST  - AHI 40.6   . Pulmonary hypertension   . Renal insufficiency    Managed by Dr. Wallace Keller  . RLS (restless legs syndrome) 04/27/2015    Past Surgical History:  Procedure Laterality Date  . BREAST REDUCTION SURGERY    . EYE SURGERY Right    cateracts  . HAMMER TOE SURGERY    . LEFT HEART CATH AND CORONARY ANGIOGRAPHY N/A 02/03/2018   Procedure: LEFT HEART CATH AND CORONARY ANGIOGRAPHY;  Surgeon: Martinique, Peter M, MD;  Location: [facility];  Service: Cardiovascular;  Laterality: N/A;  . SHOULDER SURGERY Right    "I BROKE MY SHOUDLER  . THIGH SURGERY     "TO REMOVE A TUMOR "   Family History:  Family History  Problem Relation Age of Onset  . Diabetes Mother   . Heart disease Mother   . Heart disease Brother   . Heart disease Sister        CABG  . Alcohol abuse Sister   . Mental illness Brother   . Diabetes Brother    Family Psychiatric  History:  Social History:  Social History   Substance and Sexual Activity  Alcohol Use No  . Alcohol/week: 0.0 standard drinks     Social History   Substance and Sexual Activity  Drug Use No    Social History  Socioeconomic History  . Marital status: Married    Spouse name: Orpah Greek  . Number of children: 3  . Years of education: 37  . Highest education level: Associate degree: academic program  Occupational History  . Occupation: Retired  Tobacco Use  . Smoking status: Never Smoker  . Smokeless tobacco: Never Used  Substance and Sexual Activity  . Alcohol use: No    Alcohol/week: 0.0 standard drinks  . Drug use: No  . Sexual activity: Yes    Partners: Male    Birth control/protection: Post-menopausal  Other Topics Concern  . Not on file  Social History Narrative   Lives at home with husband.       Right handed   Social Determinants of Health   Financial Resource Strain:   . Difficulty of Paying Living Expenses: Not on file   Food Insecurity:   . Worried About Charity fundraiser in the Last Year: Not on file  . Ran Out of Food in the Last Year: Not on file  Transportation Needs:   . Lack of Transportation (Medical): Not on file  . Lack of Transportation (Non-Medical): Not on file  Physical Activity:   . Days of Exercise per Week: Not on file  . Minutes of Exercise per Session: Not on file  Stress:   . Feeling of Stress : Not on file  Social Connections:   . Frequency of Communication with Friends and Family: Not on file  . Frequency of Social Gatherings with Friends and Family: Not on file  . Attends Religious Services: Not on file  . Active Member of Clubs or Organizations: Not on file  . Attends Archivist Meetings: Not on file  . Marital Status: Not on file   Additional Social History:                         Sleep: Good  Appetite:  Good  Current Medications: Current Outpatient Medications  Medication Sig Dispense Refill  . acetaminophen (TYLENOL) 325 MG tablet Take 325-650 mg by mouth every 6 (six) hours as needed for mild pain, moderate pain or headache.    Marland Kitchen apixaban (ELIQUIS) 5 MG TABS tablet Take 1 tablet (5 mg total) by mouth 2 (two) times daily. 180 tablet 1  . ARIPiprazole (ABILIFY) 10 MG tablet Take 1 tablet (10 mg total) by mouth 2 (two) times daily. 90 tablet 5  . atorvastatin (LIPITOR) 40 MG tablet Take 1 tablet (40 mg total) by mouth daily. 90 tablet 3  . atorvastatin (LIPITOR) 40 MG tablet TAKE 1 TABLET(40 MG) BY MOUTH DAILY 90 tablet 1  . budesonide-formoterol (SYMBICORT) 80-4.5 MCG/ACT inhaler Inhale 2 puffs into the lungs 2 (two) times daily. (Patient taking differently: Inhale 2 puffs into the lungs 2 (two) times daily as needed (shortness of breath). ) 2 Inhaler 0  . carbamazepine (CARBATROL) 100 MG 12 hr capsule Take 1 capsule (100 mg total) by mouth daily. 90 capsule 1  . Cholecalciferol (VITAMIN D3) 1000 UNITS CAPS Take 1,000 Units by mouth daily.    Marland Kitchen  diltiazem (CARDIZEM CD) 120 MG 24 hr capsule TAKE 1 CAPSULE AT BEDTIME (Patient taking differently: Take 120 mg by mouth at bedtime. ) 90 capsule 3  . Eszopiclone (ESZOPICLONE) 3 MG TABS Take 1 tablet (3 mg total) by mouth at bedtime as needed. Take immediately before bedtime (Patient taking differently: Take 3 mg by mouth at bedtime as needed (sleep). Take immediately before bedtime)  90 tablet 1  . furosemide (LASIX) 40 MG tablet Take 1 tablet (40 mg total) by mouth every morning. 90 tablet 1  . ibuprofen (ADVIL) 800 MG tablet TAKE 1 TABLET BY MOUTH EVERY 8 HOURS AS NEEDED( USE SPARINGLY WITH BEING ON ELIQUIS) 30 tablet 1  . Insulin Glargine (LANTUS SOLOSTAR) 100 UNIT/ML Solostar Pen INJECT 60 UNITS UNDER THE SKIN TWICE A DAY (Patient taking differently: Inject 60 Units into the skin 2 (two) times daily. ) 90 mL 0  . LORazepam (ATIVAN) 1 MG tablet Take 1-2 tablets (1-2 mg total) by mouth See admin instructions. Take 1 tab (1 mg) in the morning and 2 tabs (2 mg ) at bedtime 90 tablet 4  . metoprolol tartrate (LOPRESSOR) 100 MG tablet Take 1 tablet (100 mg total) by mouth 2 (two) times daily. Take 100mg  + 25mg  for a total of 125mg  twice a day.    . metoprolol tartrate (LOPRESSOR) 25 MG tablet TAKE 1 TABLET TWICE A DAY (TO BE TAKEN ALONG WITH THE LOPRESSOR 100 MG TWICE A DAY) 180 tablet 1  . naloxone (NARCAN) nasal spray 4 mg/0.1 mL Place 1 spray into the nose once as needed (opiod overdose).    Marland Kitchen oxyCODONE-acetaminophen (PERCOCET/ROXICET) 5-325 MG tablet     . potassium chloride SA (KLOR-CON) 20 MEQ tablet Take 1 tablet (20 mEq total) by mouth daily. 30 tablet 3  . predniSONE (DELTASONE) 20 MG tablet 2 po at sametime daily for 5 days 10 tablet 0  . pregabalin (LYRICA) 150 MG capsule Take 1 capsule (150 mg total) by mouth 2 (two) times daily. 180 capsule 2  . PROAIR HFA 108 (90 Base) MCG/ACT inhaler Inhale 1-2 puffs into the lungs every 6 (six) hours as needed. As needed    . Semaglutide (RYBELSUS) 3 MG  TABS Take by mouth.    . SUMAtriptan (IMITREX) 100 MG tablet Take 1 tablet (100 mg total) by mouth every 2 (two) hours as needed for migraine. May repeat in 2 hours if headache persists or recurs. 10 tablet 5  . topiramate (TOPAMAX) 25 MG tablet Take 2 tablets every night (Patient taking differently: Take 50 mg by mouth at bedtime. One in AM Take 3 tablets every night) 60 tablet 11  . traZODone (DESYREL) 100 MG tablet Take 4 tablets (400 mg total) by mouth at bedtime. 360 tablet 1  . triamcinolone ointment (KENALOG) 0.5 % Apply 1 application topically 2 (two) times daily. 30 g 0  . VASCEPA 1 g CAPS TAKE 2 CAPSULES TWICE A DAY WITH MEALS 360 capsule 3   No current facility-administered medications for this visit.   Facility-Administered Medications Ordered in Other Visits  Medication Dose Route Frequency Provider Last Rate Last Admin  . bupivacaine-epinephrine (MARCAINE W/ EPI) 0.5% -1:200000 injection    Anesthesia Intra-op Effie Berkshire, MD   12 mL at 01/21/19 0935    Lab Results: No results found for this or any previous visit (from the past 48 hour(s)).  Blood Alcohol level:  Lab Results  Component Value Date   ETH <5 10/01/2015    Physical Findings: AIMS:  , ,  ,  ,    CIWA:    COWS:     Musculoskeletal: Strength & Muscle Tone: within normal limits Gait & Station: normal Patient leans: N/A  Psychiatric Specialty Exam: ROS Patient ID: Amber Stephenson, female   DOB: [birth year], 26 y.o.   MRN: [id number] [facility] MD Progress Note .she's going to apply 07/01/2019 11:30 AM Helene Kelp  ELEXIUS BURRIER  MRN:  [id number] Subjective:  Doing well Principal Problem: Bipolar disorder most recent episode manic Diagnosis:  Bipolar disorder most recent   This patient has a number of psychiatric problems.  The first is that of bipolar disorder.  She is doing very well taking Abilify 30 mg.  She also takes Tegretol 100 mg.  She recently had some labs back that showed a normal liver function but did  not really reveal a Tegretol blood level.  In the next 6 months we will get a level but we have obtained one before it is been in the normal range.  She also takes Lyrica 150 mg twice daily.  Her second problem is that of insomnia.  The patient takes trazodone and does well.  She does not take any doxepin.  Patient is doing very well.  She will return to see me in 3 months. Past Medical History:  Diagnosis Date  . Atrial fibrillation with RVR 05/19/2017  . Bipolar I disorder 01/23/2015  . CAD (coronary artery disease)    Nonobstructive; Managed by Dr. Bronson Ing  . Cardiomegaly 01/12/2018  . Chronic anticoagulation 05/30/2015  . Chronic back pain    Lower back  . Chronic diastolic CHF (congestive heart failure) 05/30/2015  . Diabetes mellitus, type 2, without complication   . Diabetic neuropathy 02/06/2016  . Essential hypertension   . Fibromyalgia 03/19/2017  . Herpes genitalis in women 07/16/2015  . Hyperlipidemia   . Hypertension   . Hypoxia 02/01/2019  . Insomnia 01/23/2015  . Lipoma 02/08/2015  . Major depressive disorder   . Migraine headache with aura 02/12/2016  . 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 NAFL (nonalcoholic fatty liver) 123XX123  . OSA (obstructive sleep apnea) 02/24/2019   10/09/2018 - HST  - AHI 40.6   . Pulmonary hypertension   . Renal insufficiency    Managed by Dr. Wallace Keller  . RLS (restless legs syndrome) 04/27/2015    Past Surgical History:  Procedure Laterality Date  . BREAST REDUCTION SURGERY    . EYE SURGERY Right    cateracts  . HAMMER TOE SURGERY    . LEFT HEART CATH AND CORONARY ANGIOGRAPHY N/A 02/03/2018   Procedure: LEFT HEART CATH AND CORONARY ANGIOGRAPHY;  Surgeon: Martinique, Peter M, MD;  Location: [facility];  Service: Cardiovascular;  Laterality: N/A;  . SHOULDER SURGERY Right    "I BROKE MY SHOUDLER  . THIGH SURGERY     "TO REMOVE A TUMOR "   Family History:  Family History  Problem Relation Age of Onset  . Diabetes  Mother   . Heart disease Mother   . Heart disease Brother   . Heart disease Sister        CABG  . Alcohol abuse Sister   . Mental illness Brother   . Diabetes Brother    Family Psychiatric  History:  Social History:  Social History   Substance and Sexual Activity  Alcohol Use No  . Alcohol/week: 0.0 standard drinks     Social History   Substance and Sexual Activity  Drug Use No    Social History   Socioeconomic History  . Marital status: Married    Spouse name: Orpah Greek  . Number of children: 3  . Years of education: 9  . Highest education level: Associate degree: academic program  Occupational History  . Occupation: Retired  Tobacco Use  . Smoking status: Never Smoker  . Smokeless tobacco: Never Used  Substance and Sexual  Activity  . Alcohol use: No    Alcohol/week: 0.0 standard drinks  . Drug use: No  . Sexual activity: Yes    Partners: Male    Birth control/protection: Post-menopausal  Other Topics Concern  . Not on file  Social History Narrative   Lives at home with husband.       Right handed   Social Determinants of Health   Financial Resource Strain:   . Difficulty of Paying Living Expenses: Not on file  Food Insecurity:   . Worried About Charity fundraiser in the Last Year: Not on file  . Ran Out of Food in the Last Year: Not on file  Transportation Needs:   . Lack of Transportation (Medical): Not on file  . Lack of Transportation (Non-Medical): Not on file  Physical Activity:   . Days of Exercise per Week: Not on file  . Minutes of Exercise per Session: Not on file  Stress:   . Feeling of Stress : Not on file  Social Connections:   . Frequency of Communication with Friends and Family: Not on file  . Frequency of Social Gatherings with Friends and Family: Not on file  . Attends Religious Services: Not on file  . Active Member of Clubs or Organizations: Not on file  . Attends Archivist Meetings: Not on file  . Marital Status: Not  on file   Additional Social History:                         Sleep: Good  Appetite:  Good  Current Medications: Current Outpatient Medications  Medication Sig Dispense Refill  . acetaminophen (TYLENOL) 325 MG tablet Take 325-650 mg by mouth every 6 (six) hours as needed for mild pain, moderate pain or headache.    Marland Kitchen apixaban (ELIQUIS) 5 MG TABS tablet Take 1 tablet (5 mg total) by mouth 2 (two) times daily. 180 tablet 1  . ARIPiprazole (ABILIFY) 10 MG tablet Take 1 tablet (10 mg total) by mouth 2 (two) times daily. 90 tablet 5  . atorvastatin (LIPITOR) 40 MG tablet Take 1 tablet (40 mg total) by mouth daily. 90 tablet 3  . atorvastatin (LIPITOR) 40 MG tablet TAKE 1 TABLET(40 MG) BY MOUTH DAILY 90 tablet 1  . budesonide-formoterol (SYMBICORT) 80-4.5 MCG/ACT inhaler Inhale 2 puffs into the lungs 2 (two) times daily. (Patient taking differently: Inhale 2 puffs into the lungs 2 (two) times daily as needed (shortness of breath). ) 2 Inhaler 0  . carbamazepine (CARBATROL) 100 MG 12 hr capsule Take 1 capsule (100 mg total) by mouth daily. 90 capsule 1  . Cholecalciferol (VITAMIN D3) 1000 UNITS CAPS Take 1,000 Units by mouth daily.    Marland Kitchen diltiazem (CARDIZEM CD) 120 MG 24 hr capsule TAKE 1 CAPSULE AT BEDTIME (Patient taking differently: Take 120 mg by mouth at bedtime. ) 90 capsule 3  . Eszopiclone (ESZOPICLONE) 3 MG TABS Take 1 tablet (3 mg total) by mouth at bedtime as needed. Take immediately before bedtime (Patient taking differently: Take 3 mg by mouth at bedtime as needed (sleep). Take immediately before bedtime) 90 tablet 1  . furosemide (LASIX) 40 MG tablet Take 1 tablet (40 mg total) by mouth every morning. 90 tablet 1  . ibuprofen (ADVIL) 800 MG tablet TAKE 1 TABLET BY MOUTH EVERY 8 HOURS AS NEEDED( USE SPARINGLY WITH BEING ON ELIQUIS) 30 tablet 1  . Insulin Glargine (LANTUS SOLOSTAR) 100 UNIT/ML Solostar Pen INJECT 60  UNITS UNDER THE SKIN TWICE A DAY (Patient taking differently:  Inject 60 Units into the skin 2 (two) times daily. ) 90 mL 0  . LORazepam (ATIVAN) 1 MG tablet Take 1-2 tablets (1-2 mg total) by mouth See admin instructions. Take 1 tab (1 mg) in the morning and 2 tabs (2 mg ) at bedtime 90 tablet 4  . metoprolol tartrate (LOPRESSOR) 100 MG tablet Take 1 tablet (100 mg total) by mouth 2 (two) times daily. Take 100mg  + 25mg  for a total of 125mg  twice a day.    . metoprolol tartrate (LOPRESSOR) 25 MG tablet TAKE 1 TABLET TWICE A DAY (TO BE TAKEN ALONG WITH THE LOPRESSOR 100 MG TWICE A DAY) 180 tablet 1  . naloxone (NARCAN) nasal spray 4 mg/0.1 mL Place 1 spray into the nose once as needed (opiod overdose).    Marland Kitchen oxyCODONE-acetaminophen (PERCOCET/ROXICET) 5-325 MG tablet     . potassium chloride SA (KLOR-CON) 20 MEQ tablet Take 1 tablet (20 mEq total) by mouth daily. 30 tablet 3  . predniSONE (DELTASONE) 20 MG tablet 2 po at sametime daily for 5 days 10 tablet 0  . pregabalin (LYRICA) 150 MG capsule Take 1 capsule (150 mg total) by mouth 2 (two) times daily. 180 capsule 2  . PROAIR HFA 108 (90 Base) MCG/ACT inhaler Inhale 1-2 puffs into the lungs every 6 (six) hours as needed. As needed    . Semaglutide (RYBELSUS) 3 MG TABS Take by mouth.    . SUMAtriptan (IMITREX) 100 MG tablet Take 1 tablet (100 mg total) by mouth every 2 (two) hours as needed for migraine. May repeat in 2 hours if headache persists or recurs. 10 tablet 5  . topiramate (TOPAMAX) 25 MG tablet Take 2 tablets every night (Patient taking differently: Take 50 mg by mouth at bedtime. One in AM Take 3 tablets every night) 60 tablet 11  . traZODone (DESYREL) 100 MG tablet Take 4 tablets (400 mg total) by mouth at bedtime. 360 tablet 1  . triamcinolone ointment (KENALOG) 0.5 % Apply 1 application topically 2 (two) times daily. 30 g 0  . VASCEPA 1 g CAPS TAKE 2 CAPSULES TWICE A DAY WITH MEALS 360 capsule 3   No current facility-administered medications for this visit.   Facility-Administered Medications  Ordered in Other Visits  Medication Dose Route Frequency Provider Last Rate Last Admin  . bupivacaine-epinephrine (MARCAINE W/ EPI) 0.5% -1:200000 injection    Anesthesia Intra-op Effie Berkshire, MD   12 mL at 01/21/19 0935    Lab Results: No results found for this or any previous visit (from the past 48 hour(s)).  Blood Alcohol level:  Lab Results  Component Value Date   ETH <5 10/01/2015    Physical Findings: AIMS:  , ,  ,  ,    CIWA:    COWS:     Musculoskeletal: Strength & Muscle Tone: within normal limits Gait & Station: normal Patient leans: N/A  Psychiatric Specialty Exam: ROS  There were no vitals taken for this visit.There is no height or weight on file to calculate BMI.  General Appearance: Casual  Eye Contact::  Good  Speech:  Clear and Coherent  Volume:  Normal  Mood:  Euthymic  Affect:  Appropriate  Thought Process:  Coherent  Orientation:  Full (Time, Place, and Person)  Thought Content:  WDL  Suicidal Thoughts:  No  Homicidal Thoughts:  No  Memory:  NA  Judgement:  Good  Insight:  Good  Psychomotor  Activity:  Normal  Concentration:  Good  Recall:  Good  Fund of Knowledge:Good  Language: Good  Akathisia:  No  Handed:  Right  AIMS (if indicated):     Assets: Good spirits   ADL's:  Intact  Cognition: WNL  Sleep:     eks Treatment Plan Summary: This patient's first admission important problem is bipolar disorder. She takes multiple medications for this condition. This includes Tegretol and Abilify. At this time the patient is not in therapy. She'll make an attempt to get a therapist in her community. She's taking a moderate to high dose of Ativan 1 mg one in the morning and 2 at night and requested more of it. At this time we will not increase the dose of Ativan. We will have her response to her second problem which is her problems sleeping. She takes 3 trazodone to sleep and doesn't stay asleep. At this time she'll continue taking the trazodone but  she'll begin on doxepin 50 mg in addition. Today we spent more than 50% of the time talking about her medications and educating her. She understands that she cannot drink alcohol with her medications and she'll actually does not. The patient stays busy and active. She does importance of being in therapy and she'll actively looking for a therapist at this time. She'll return to see me in 3 months for a 30 minute visit. At that time consider getting a Tegretol blood level and other blood work.patient denies any physical complaints of chest pain shortness of breath.  There were no vitals taken for this visit.There is no height or weight on file to calculate BMI.  General Appearance: Casual  Eye Contact::  Good  Speech:  Clear and Coherent  Volume:  Normal  Mood:  Euthymic  Affect:  Appropriate  Thought Process:  Coherent  Orientation:  Full (Time, Place, and Person)  Thought Content:  WDL  Suicidal Thoughts:  No  Homicidal Thoughts:  No  Memory:  NA  Judgement:  Good  Insight:  Good  Psychomotor Activity:  Normal  Concentration:  Good  Recall:  Good  Fund of Knowledge:Good  Language: Good  Akathisia:  No  Handed:  Right  AIMS (if indicated):     Assets: Good spirits   ADL's:  Intact  Cognition: WNL  Sleep:     eks Treatment Plan Summary:  Today the patient is doing well.  It is noted that she is not had any blood work in a while.  We will go ahead and make arranges for her to get it at [facility].  The patient will continue taking Tegretol 100 mg once a day.  This is for her first problem of bipolar disorder.  At this time she is doing great.  Should continue taking her Abilify as prescribed as well.  Her second problem is that of insomnia.  Should continue taking trazodone which we will increase to 100 mg for today.  She no longer takes doxepin.  Patient will continue taking Ativan 1 mg 1 in the morning and 2 at night.  I suspect overall this helps both her bipolar state as well as her  insomnia.  Patient will also continue taking Lunesta 3 mg as prescribed.  This patient will be seen again in 3 months.  I believe she is very stable.  She is functioning well.  The blood work the patient needs is a morning Tegretol level, comprehensive metabolic panel and a CBC.

## 2019-07-04 ENCOUNTER — Other Ambulatory Visit: Payer: Self-pay

## 2019-07-04 DIAGNOSIS — M25571 Pain in right ankle and joints of right foot: Secondary | ICD-10-CM | POA: Diagnosis not present

## 2019-07-04 DIAGNOSIS — M25572 Pain in left ankle and joints of left foot: Secondary | ICD-10-CM | POA: Diagnosis not present

## 2019-07-04 MED ORDER — TOPIRAMATE 25 MG PO TABS: ORAL_TABLET | ORAL | 11 refills | Status: DC

## 2019-07-07 ENCOUNTER — Other Ambulatory Visit: Payer: Self-pay

## 2019-07-07 MED ORDER — POTASSIUM CHLORIDE CRYS ER 20 MEQ PO TBCR: 20.0000 meq | EXTENDED_RELEASE_TABLET | Freq: Every day | ORAL | 3 refills | Status: DC

## 2019-07-11 DIAGNOSIS — D100 Benign neoplasm of lip: Secondary | ICD-10-CM | POA: Diagnosis not present

## 2019-07-18 DIAGNOSIS — Z23 Encounter for immunization: Secondary | ICD-10-CM | POA: Diagnosis not present

## 2019-07-19 DIAGNOSIS — D229 Melanocytic nevi, unspecified: Secondary | ICD-10-CM | POA: Diagnosis not present

## 2019-07-21 ENCOUNTER — Ambulatory Visit (HOSPITAL_COMMUNITY): Payer: Self-pay | Admitting: Psychiatry

## 2019-07-25 DIAGNOSIS — D539 Nutritional anemia, unspecified: Secondary | ICD-10-CM | POA: Diagnosis not present

## 2019-07-25 DIAGNOSIS — M129 Arthropathy, unspecified: Secondary | ICD-10-CM | POA: Diagnosis not present

## 2019-07-25 DIAGNOSIS — E7800 Pure hypercholesterolemia, unspecified: Secondary | ICD-10-CM | POA: Diagnosis not present

## 2019-07-25 DIAGNOSIS — R300 Dysuria: Secondary | ICD-10-CM | POA: Diagnosis not present

## 2019-07-25 DIAGNOSIS — Z1159 Encounter for screening for other viral diseases: Secondary | ICD-10-CM | POA: Diagnosis not present

## 2019-07-25 DIAGNOSIS — M5136 Other intervertebral disc degeneration, lumbar region: Secondary | ICD-10-CM | POA: Diagnosis not present

## 2019-07-25 DIAGNOSIS — Z79899 Other long term (current) drug therapy: Secondary | ICD-10-CM | POA: Diagnosis not present

## 2019-07-25 DIAGNOSIS — E1165 Type 2 diabetes mellitus with hyperglycemia: Secondary | ICD-10-CM | POA: Diagnosis not present

## 2019-07-25 DIAGNOSIS — E559 Vitamin D deficiency, unspecified: Secondary | ICD-10-CM | POA: Diagnosis not present

## 2019-07-25 DIAGNOSIS — R5383 Other fatigue: Secondary | ICD-10-CM | POA: Diagnosis not present

## 2019-07-25 DIAGNOSIS — M545 Low back pain: Secondary | ICD-10-CM | POA: Diagnosis not present

## 2019-07-31 ENCOUNTER — Other Ambulatory Visit: Payer: Self-pay | Admitting: Family Medicine

## 2019-08-01 ENCOUNTER — Other Ambulatory Visit: Payer: Self-pay | Admitting: Family

## 2019-08-01 NOTE — H&P (Signed)
Patient's anticipated LOS is less than 2 midnights, meeting these requirements: - Younger than 68 - Lives within 1 hour of care - Has a competent adult at home to recover with post-op recover - NO history of  - Chronic pain requiring opiods  - Diabetes  - Coronary Artery Disease  - Heart failure  - Heart attack  - Stroke  - DVT/VTE  - Cardiac arrhythmia  - Respiratory Failure/COPD  - Renal failure  - Anemia  - Advanced Liver disease       Amber Stephenson is an 74 y.o. female.    Chief Complaint: right shoulder pain  HPI: Pt is a 74 y.o. female complaining of right shoulder pain for multiple years. Pain had continually increased since the beginning. X-rays in the clinic show end-stage arthritic changes of the right shoulder. Pt has tried various conservative treatments which have failed to alleviate their symptoms, including injections and therapy. Various options are discussed with the patient. Risks, benefits and expectations were discussed with the patient. Patient understand the risks, benefits and expectations and wishes to proceed with surgery.   PCP:  Baruch Gouty, FNP  D/C Plans: Home  PMH: Past Medical History:  Diagnosis Date  . Atrial fibrillation with RVR 05/19/2017  . Bipolar I disorder 01/23/2015  . CAD (coronary artery disease)    Nonobstructive; Managed by Dr. Bronson Ing  . Cardiomegaly 01/12/2018  . Chronic anticoagulation 05/30/2015  . Chronic back pain    Lower back  . Chronic diastolic CHF (congestive heart failure) 05/30/2015  . Diabetes mellitus, type 2, without complication   . Diabetic neuropathy 02/06/2016  . Essential hypertension   . Fibromyalgia 03/19/2017  . Herpes genitalis in women 07/16/2015  . Hyperlipidemia   . Hypertension   . Hypoxia 02/01/2019  . Insomnia 01/23/2015  . Lipoma 02/08/2015  . Major depressive disorder   . Migraine headache with aura 02/12/2016  . 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[place]) 04/21/2019  . NAFL  (nonalcoholic fatty liver) 123XX123  . OSA (obstructive sleep apnea) 02/24/2019   10/09/2018 - HST  - AHI 40.6   . Pulmonary hypertension   . Renal insufficiency    Managed by Dr. Wallace Keller  . RLS (restless legs syndrome) 04/27/2015    PSH: Past Surgical History:  Procedure Laterality Date  . BREAST REDUCTION SURGERY    . EYE SURGERY Right    cateracts  . HAMMER TOE SURGERY    . LEFT HEART CATH AND CORONARY ANGIOGRAPHY N/A 02/03/2018   Procedure: LEFT HEART CATH AND CORONARY ANGIOGRAPHY;  Surgeon: Martinique, Peter M, MD;  Location: [facility];  Service: Cardiovascular;  Laterality: N/A;  . SHOULDER SURGERY Right    "I BROKE MY SHOUDLER  . THIGH SURGERY     "TO REMOVE A TUMOR "    Social History:  reports that she has never smoked. She has never used smokeless tobacco. She reports that she does not drink alcohol or use drugs.  Allergies:  Allergies  Allergen Reactions  . Ivp Dye [Iodinated Diagnostic Agents] Anaphylaxis and Swelling    Throat closes  . Latex     Latex tape pulls skin with it    Medications: No current facility-administered medications for this encounter.   Current Outpatient Medications  Medication Sig Dispense Refill  . acetaminophen (TYLENOL) 325 MG tablet Take 325-650 mg by mouth every 6 (six) hours as needed for mild pain, moderate pain or headache.    Marland Kitchen apixaban (ELIQUIS) 5 MG TABS tablet Take  1 tablet (5 mg total) by mouth 2 (two) times daily. 180 tablet 1  . ARIPiprazole (ABILIFY) 10 MG tablet Take 1 tablet (10 mg total) by mouth 2 (two) times daily. 90 tablet 5  . atorvastatin (LIPITOR) 40 MG tablet Take 1 tablet (40 mg total) by mouth daily. 90 tablet 3  . atorvastatin (LIPITOR) 40 MG tablet TAKE 1 TABLET(40 MG) BY MOUTH DAILY 90 tablet 1  . budesonide-formoterol (SYMBICORT) 80-4.5 MCG/ACT inhaler Inhale 2 puffs into the lungs 2 (two) times daily. (Patient taking differently: Inhale 2 puffs into the lungs 2 (two) times daily as needed (shortness of  breath). ) 2 Inhaler 0  . carbamazepine (CARBATROL) 100 MG 12 hr capsule Take 1 capsule (100 mg total) by mouth daily. 90 capsule 1  . Cholecalciferol (VITAMIN D3) 1000 UNITS CAPS Take 1,000 Units by mouth daily.    Marland Kitchen diltiazem (CARDIZEM CD) 120 MG 24 hr capsule TAKE 1 CAPSULE AT BEDTIME (Patient taking differently: Take 120 mg by mouth at bedtime. ) 90 capsule 3  . Eszopiclone (ESZOPICLONE) 3 MG TABS Take 1 tablet (3 mg total) by mouth at bedtime as needed. Take immediately before bedtime (Patient taking differently: Take 3 mg by mouth at bedtime as needed (sleep). Take immediately before bedtime) 90 tablet 1  . furosemide (LASIX) 40 MG tablet Take 1 tablet (40 mg total) by mouth every morning. 90 tablet 1  . ibuprofen (ADVIL) 800 MG tablet TAKE 1 TABLET BY MOUTH EVERY 8 HOURS AS NEEDED( USE SPARINGLY WITH BEING ON ELIQUIS) 30 tablet 1  . Insulin Glargine (LANTUS SOLOSTAR) 100 UNIT/ML Solostar Pen INJECT 60 UNITS UNDER THE SKIN TWICE A DAY (Patient taking differently: Inject 60 Units into the skin 2 (two) times daily. ) 90 mL 0  . LORazepam (ATIVAN) 1 MG tablet Take 1-2 tablets (1-2 mg total) by mouth See admin instructions. Take 1 tab (1 mg) in the morning and 2 tabs (2 mg ) at bedtime 90 tablet 4  . metoprolol tartrate (LOPRESSOR) 100 MG tablet Take 1 tablet (100 mg total) by mouth 2 (two) times daily. Take 100mg  + 25mg  for a total of 125mg  twice a day.    . metoprolol tartrate (LOPRESSOR) 25 MG tablet TAKE 1 TABLET TWICE A DAY (TO BE TAKEN ALONG WITH THE LOPRESSOR 100 MG TWICE A DAY) 180 tablet 1  . naloxone (NARCAN) nasal spray 4 mg/0.1 mL Place 1 spray into the nose once as needed (opiod overdose).    Marland Kitchen oxyCODONE-acetaminophen (PERCOCET/ROXICET) 5-325 MG tablet     . potassium chloride SA (KLOR-CON) 20 MEQ tablet Take 1 tablet (20 mEq total) by mouth daily. 90 tablet 3  . predniSONE (DELTASONE) 20 MG tablet 2 po at sametime daily for 5 days 10 tablet 0  . pregabalin (LYRICA) 150 MG capsule Take  1 capsule (150 mg total) by mouth 2 (two) times daily. 180 capsule 2  . PROAIR HFA 108 (90 Base) MCG/ACT inhaler Inhale 1-2 puffs into the lungs every 6 (six) hours as needed. As needed    . Semaglutide (RYBELSUS) 3 MG TABS Take by mouth.    . SUMAtriptan (IMITREX) 100 MG tablet Take 1 tablet (100 mg total) by mouth every 2 (two) hours as needed for migraine. May repeat in 2 hours if headache persists or recurs. 10 tablet 5  . topiramate (TOPAMAX) 25 MG tablet Take 3 tablets every night 90 tablet 11  . traZODone (DESYREL) 100 MG tablet Take 4 tablets (400 mg total) by mouth  at bedtime. 360 tablet 1  . triamcinolone ointment (KENALOG) 0.5 % Apply 1 application topically 2 (two) times daily. 30 g 0  . VASCEPA 1 g CAPS TAKE 2 CAPSULES TWICE A DAY WITH MEALS 360 capsule 3   Facility-Administered Medications Ordered in Other Encounters  Medication Dose Route Frequency Provider Last Rate Last Admin  . bupivacaine-epinephrine (MARCAINE W/ EPI) 0.5% -1:200000 injection    Anesthesia Intra-op Effie Berkshire, MD   12 mL at 01/21/19 0935    No results found for this or any previous visit (from the past 48 hour(s)). No results found.  ROS: Pain with rom of the right upper extremity  Physical Exam: Alert and oriented 74 y.o. female in no acute distress Cranial nerves 2-12 intact Cervical spine: full rom with no tenderness, nv intact distally Chest: active breath sounds bilaterally, no wheeze rhonchi or rales Heart: regular rate and rhythm, no murmur Abd: non tender non distended with active bowel sounds Hip is stable with rom  Right shoulder painful rom nv intact distally No rashes or edema distally  Assessment/Plan Assessment: right shoulder cuff arthropathy  Plan:  Patient will undergo a right reverse total shoulder by Dr. Veverly Fells at [facility] Risks benefits and expectations were discussed with the patient. Patient understand risks, benefits and expectations and wishes to  proceed. Preoperative templating of the joint replacement has been completed, documented, and submitted to the Operating Room personnel in order to optimize intra-operative equipment management.   Merla Riches PA-C, MPAS [facility] is now [employer] [facility] [address], [place], [place], [place] [address] Phone: [phone] [email or web address] Facebook  [employer]

## 2019-08-05 ENCOUNTER — Other Ambulatory Visit: Payer: Self-pay

## 2019-08-08 ENCOUNTER — Ambulatory Visit (INDEPENDENT_AMBULATORY_CARE_PROVIDER_SITE_OTHER): Payer: Medicare Other | Admitting: Family Medicine

## 2019-08-08 ENCOUNTER — Encounter: Payer: Self-pay | Admitting: Family Medicine

## 2019-08-08 ENCOUNTER — Other Ambulatory Visit: Payer: Self-pay

## 2019-08-08 VITALS — BP 95/65 | HR 70 | Temp 97.3°F | Resp 20 | Ht 62.0 in | Wt 196.0 lb

## 2019-08-08 DIAGNOSIS — M5441 Lumbago with sciatica, right side: Secondary | ICD-10-CM | POA: Diagnosis not present

## 2019-08-08 DIAGNOSIS — M797 Fibromyalgia: Secondary | ICD-10-CM | POA: Diagnosis not present

## 2019-08-08 DIAGNOSIS — G8929 Other chronic pain: Secondary | ICD-10-CM | POA: Diagnosis not present

## 2019-08-08 NOTE — Progress Notes (Addendum)
Name: Amber Stephenson   MRN: [id number]    DOB: 12/19/1945   Date:08/08/2019       Progress Note  Subjective  Chief Complaint  Chief Complaint  Patient presents with  . Gout    HPI Patient presents with multiple complaints today.  She hasoncerns about her pain management doctor. She reports that her pain doctor did labs and advised her that her A1c was 6 and that it should be 4, which she states she has never been told before. She also reports that he told her her vitamin D levels were low and she should be taking Vitamin D repletion, which she is. Her uric acid levels were also reportedly elevated on labs that pain management did as well.   She also endorses some left great toe pain without injury, trauma, redness, swelling or warmth and some pruritis on her back since beginning her Rybelsus in January. She denies rashes or lesions to area.   Patient Active Problem List   Diagnosis Date Noted  . History of adenomatous polyp of colon 05/21/2019  . Polypharmacy 05/21/2019  . Benign paroxysmal positional vertigo due to bilateral vestibular disorder 05/20/2019  . Hyperlipidemia associated with type 2 diabetes mellitus ([place]) 05/20/2019  . Vertigo 04/25/2019  . 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[place]) 04/21/2019  . OSA (obstructive sleep apnea) 02/24/2019  . Allergic rhinitis 02/24/2019  . Acute on chronic respiratory failure with hypoxia ([place]) 01/22/2019  . 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 Morbid obesity ([place]) 07/06/2018  . Cardiomegaly 01/12/2018  . Non-alcoholic fatty liver disease 01/12/2018  . Mild intermittent asthma 01/12/2018  . Senile osteoporosis 12/15/2017  . Asthma 11/12/2017  . Fibromyalgia 03/19/2017  . Migraine headache with aura 02/12/2016  . Diabetic neuropathy associated with type 2 diabetes mellitus ([place]) 02/06/2016  . Bipolar 1 disorder, manic, moderate ([place]) 10/04/2015  . CHF (congestive heart failure) ([place]) 10/04/2015  . Depression, recurrent  ([place]) 10/03/2015  . Herpes genitalis in women 07/16/2015  . Long term current use of anticoagulant 05/30/2015  . Family history of coronary arteriosclerosis 05/30/2015  . Chronic diastolic heart failure ([place]) 05/30/2015  . Dyspnea on exertion   . Hypertension associated with diabetes ([place])   . Restless legs 04/27/2015  . Lipoma 02/08/2015  . Bipolar disorder ([place]) 01/23/2015  . Insomnia 01/23/2015  . Chronic back pain 01/04/2015  . Chronic atrial fibrillation ([place]) 01/04/2015  . DM2 (diabetes mellitus, type 2) ([facility])     Social History   Tobacco Use  . Smoking status: Never Smoker  . Smokeless tobacco: Never Used  Substance Use Topics  . Alcohol use: No    Alcohol/week: 0.0 standard drinks     Current Outpatient Medications:  .  acetaminophen (TYLENOL) 325 MG tablet, Take 325-650 mg by mouth every 6 (six) hours as needed for mild pain, moderate pain or headache., Disp: , Rfl:  .  ARIPiprazole (ABILIFY) 10 MG tablet, Take 1 tablet (10 mg total) by mouth 2 (two) times daily., Disp: 90 tablet, Rfl: 5 .  atorvastatin (LIPITOR) 40 MG tablet, Take 1 tablet (40 mg total) by mouth daily., Disp: 90 tablet, Rfl: 3 .  budesonide-formoterol (SYMBICORT) 80-4.5 MCG/ACT inhaler, Inhale 2 puffs into the lungs 2 (two) times daily. (Patient taking differently: Inhale 2 puffs into the lungs 2 (two) times daily as needed (shortness of breath). ), Disp: 2 Inhaler, Rfl: 0 .  carbamazepine (CARBATROL) 100 MG 12 hr capsule, Take 1 capsule (100 mg total) by mouth daily., Disp: 90 capsule,  Rfl: 1 .  Cholecalciferol (VITAMIN D3) 1000 UNITS CAPS, Take 1,000 Units by mouth daily., Disp: , Rfl:  .  diltiazem (CARDIZEM CD) 120 MG 24 hr capsule, TAKE 1 CAPSULE AT BEDTIME (Patient taking differently: Take 120 mg by mouth at bedtime. ), Disp: 90 capsule, Rfl: 3 .  ELIQUIS 5 MG TABS tablet, TAKE 1 TABLET TWICE A DAY, Disp: 180 tablet, Rfl: 3 .  Eszopiclone (ESZOPICLONE) 3 MG TABS, Take 1 tablet (3 mg total) by mouth  at bedtime as needed. Take immediately before bedtime (Patient taking differently: Take 3 mg by mouth at bedtime as needed (sleep). Take immediately before bedtime), Disp: 90 tablet, Rfl: 1 .  furosemide (LASIX) 40 MG tablet, Take 1 tablet (40 mg total) by mouth every morning., Disp: 90 tablet, Rfl: 1 .  ibuprofen (ADVIL) 800 MG tablet, TAKE 1 TABLET BY MOUTH EVERY 8 HOURS AS NEEDED, Disp: 30 tablet, Rfl: 1 .  Insulin Glargine (LANTUS SOLOSTAR) 100 UNIT/ML Solostar Pen, INJECT 60 UNITS UNDER THE SKIN TWICE A DAY (Patient taking differently: Inject 60 Units into the skin 2 (two) times daily. ), Disp: 90 mL, Rfl: 0 .  LORazepam (ATIVAN) 1 MG tablet, Take 1-2 tablets (1-2 mg total) by mouth See admin instructions. Take 1 tab (1 mg) in the morning and 2 tabs (2 mg ) at bedtime, Disp: 90 tablet, Rfl: 4 .  metoprolol tartrate (LOPRESSOR) 100 MG tablet, Take 1 tablet (100 mg total) by mouth 2 (two) times daily. Take 100mg  + 25mg  for a total of 125mg  twice a day., Disp: , Rfl:  .  metoprolol tartrate (LOPRESSOR) 25 MG tablet, TAKE 1 TABLET TWICE A DAY (TO BE TAKEN ALONG WITH THE LOPRESSOR 100 MG TWICE A DAY), Disp: 180 tablet, Rfl: 1 .  oxyCODONE-acetaminophen (PERCOCET/ROXICET) 5-325 MG tablet, , Disp: , Rfl:  .  potassium chloride SA (KLOR-CON) 20 MEQ tablet, Take 1 tablet (20 mEq total) by mouth daily., Disp: 90 tablet, Rfl: 3 .  pregabalin (LYRICA) 150 MG capsule, Take 1 capsule (150 mg total) by mouth 2 (two) times daily., Disp: 180 capsule, Rfl: 2 .  PROAIR HFA 108 (90 Base) MCG/ACT inhaler, Inhale 1-2 puffs into the lungs every 6 (six) hours as needed. As needed, Disp: , Rfl:  .  Semaglutide (RYBELSUS) 7 MG TABS, Take by mouth daily. , Disp: , Rfl:  .  SUMAtriptan (IMITREX) 100 MG tablet, Take 1 tablet (100 mg total) by mouth every 2 (two) hours as needed for migraine. May repeat in 2 hours if headache persists or recurs., Disp: 10 tablet, Rfl: 5 .  topiramate (TOPAMAX) 25 MG tablet, Take 3 tablets every  night, Disp: 90 tablet, Rfl: 11 .  traZODone (DESYREL) 100 MG tablet, Take 4 tablets (400 mg total) by mouth at bedtime., Disp: 360 tablet, Rfl: 1 .  triamcinolone ointment (KENALOG) 0.5 %, Apply 1 application topically 2 (two) times daily., Disp: 30 g, Rfl: 0 .  VASCEPA 1 g CAPS, TAKE 2 CAPSULES TWICE A DAY WITH MEALS, Disp: 360 capsule, Rfl: 3 .  naloxone (NARCAN) nasal spray 4 mg/0.1 mL, Place 1 spray into the nose once as needed (opiod overdose)., Disp: , Rfl:  No current facility-administered medications for this visit.  Facility-Administered Medications Ordered in Other Visits:  .  bupivacaine-epinephrine (MARCAINE W/ EPI) 0.5% -1:200000 injection, , , Anesthesia Intra-op, Effie Berkshire, MD, 12 mL at 01/21/19 0935  Allergies  Allergen Reactions  . Ivp Dye [Iodinated Diagnostic Agents] Anaphylaxis and Swelling  Throat closes  . Latex     Latex tape pulls skin with it    I personally reviewed active problem list, medication list, allergies, family history, social history, health maintenance, notes from last encounter, lab results with the patient/caregiver today.  Review of Systems  Constitutional: Negative for chills, fever and weight loss.  HENT: Negative for ear pain and hearing loss.   Eyes: Negative for blurred vision and pain.  Respiratory: Negative for cough and shortness of breath.   Cardiovascular: Negative for chest pain, palpitations and leg swelling.  Gastrointestinal: Negative for abdominal pain, constipation, diarrhea, nausea and vomiting.  Genitourinary: Negative for dysuria.  Musculoskeletal: Positive for joint pain. Negative for myalgias.  Skin: Positive for itching. Negative for rash.  Neurological: Negative for dizziness, weakness and headaches.  Endo/Heme/Allergies: Negative.   Psychiatric/Behavioral: Negative for depression, memory loss and suicidal ideas.      Objective  Vitals:   08/08/19 1133  BP: 95/65  Pulse: 70  Resp: 20  Temp: (!) 97.3  F (36.3 C)  SpO2: 98%  Weight: 196 lb (88.9 kg)  Height: 5\' 2"  (1.575 m)     Body mass index is 35.85 kg/m.  Nursing Note and Vital Signs reviewed.  Physical Exam Vitals and nursing note reviewed.  Constitutional:      Appearance: Normal appearance. She is normal weight.  HENT:     Head: Normocephalic and atraumatic.     Nose: Nose normal.     Mouth/Throat:     Mouth: Mucous membranes are moist.  Eyes:     Pupils: Pupils are equal, round, and reactive to light.  Cardiovascular:     Rate and Rhythm: Normal rate and regular rhythm.     Pulses: Normal pulses.     Heart sounds: Normal heart sounds.  Pulmonary:     Effort: Pulmonary effort is normal.     Breath sounds: Normal breath sounds.  Abdominal:     General: Abdomen is flat.     Palpations: Abdomen is soft.  Musculoskeletal:        General: Normal range of motion.     Cervical back: Normal range of motion and neck supple.  Skin:    General: Skin is warm and dry.     Capillary Refill: Capillary refill takes less than 2 seconds.  Neurological:     General: No focal deficit present.     Mental Status: She is alert and oriented to person, place, and time. Mental status is at baseline.  Psychiatric:        Mood and Affect: Mood normal.        Behavior: Behavior normal.        Thought Content: Thought content normal.        Judgment: Judgment normal.      No results found for this or any previous visit (from the past 72 hour(s)).  Assessment & Plan   Diagnoses and all orders for this visit:  Chronic right-sided low back pain with right-sided sciatic Fibromyalgia -     Ambulatory referral to Pain Clinic Patient presents today for referral to new pain management provider No acute signs of gout. Discussed using mild soap and good moisturizer to help with itching on back.   Return if symptoms worsen or fail to improve.  The above assessment and management plan was discussed with the patient. The patient  verbalized understanding of and has agreed to the management plan. Patient is aware to call the clinic if they develop  any new symptoms or if symptoms fail to improve or worsen. Patient is aware when to return to the clinic for a follow-up visit. Patient educated on when it is appropriate to go to the emergency department.    Scherrie Gerlach, BSN, RN, AGNP-Student   I personally was present during the history, physical exam, and medical decision-making activities of this service and have verified that the service and findings are accurately documented in the nurse practitioner student's note.  Monia Pouch, FNP-C [facility] [address] [place], [place] [address] [phone]

## 2019-08-11 ENCOUNTER — Encounter: Payer: Self-pay | Admitting: Physical Medicine and Rehabilitation

## 2019-08-11 NOTE — Patient Instructions (Signed)
DUE TO COVID-19 ONLY ONE VISITOR IS ALLOWED TO COME WITH YOU AND STAY IN THE WAITING ROOM ONLY DURING PRE OP AND PROCEDURE DAY OF SURGERY. THE 1 VISITOR MAY VISIT WITH YOU AFTER SURGERY IN YOUR PRIVATE ROOM DURING VISITING HOURS ONLY!  YOU NEED TO HAVE A COVID 19 TEST ON__3/16/21_____ @_2 :30______, THIS TEST MUST BE DONE BEFORE SURGERY,  ONCE YOUR COVID TEST IS COMPLETED, PLEASE BEGIN THE QUARANTINE INSTRUCTIONS AS OUTLINED IN YOUR HANDOUT.                Amber Stephenson    Your procedure is scheduled on: 08/19/19   Report to [facility] Main  Entrance   Report to short stay at 5:30 AM     Call this number if you have problems the morning of surgery [place], NO CHEWING GUM [place].  Do not eat food After Midnight.   YOU MAY HAVE CLEAR LIQUIDS FROM MIDNIGHT UNTIL 4:30AM  . At 4:30AM Please finish the prescribed Pre-Surgery Gatorade drink.   Nothing by mouth after you finish the Gatorade drink !    Take these medicines the morning of surgery with A SIP OF WATER:  Lorazapam, Trazodone, Lyrica, Abilify, Metoprolol.                                                                                                                                Use your inhalers and bring them with you to the hospital with you  DO NOT TAKE ANY DIABETIC MEDICATIONS DAY OF YOUR SURGERY       How to Manage Your Diabetes Before and After Surgery  Why is it important to control my blood sugar before and after surgery? . Improving blood sugar levels before and after surgery helps healing and can limit problems. . A way of improving blood sugar control is eating a healthy diet by: o  Eating less sugar and carbohydrates o  Increasing activity/exercise o  Talking with your doctor about reaching your blood sugar goals . High blood sugars (greater than 180 mg/dL) can raise your risk of infections and slow your recovery, so you  will need to focus on controlling your diabetes during the weeks before surgery. . Make sure that the doctor who takes care of your diabetes knows about your planned surgery including the date and location.  How do I manage my blood sugar before surgery? . Check your blood sugar at least 4 times a day, starting 2 days before surgery, to make sure that the level is not too high or low. o Check your blood sugar the morning of your surgery when you wake up and every 2 hours until you get to the Short Stay unit. . If your blood sugar is less than 70 mg/dL, you will need to treat for low blood sugar: o Do not take insulin. o  Treat a low blood sugar (less than 70 mg/dL) with  cup of clear juice (cranberry or apple), 4 glucose tablets, OR glucose gel. o Recheck blood sugar in 15 minutes after treatment (to make sure it is greater than 70 mg/dL). If your blood sugar is not greater than 70 mg/dL on recheck, call [phone] for further instructions. . Report your blood sugar to the short stay nurse when you get to Short Stay.  . If you are admitted to the hospital after surgery: o Your blood sugar will be checked by the staff and you will probably be given insulin after surgery (instead of oral diabetes medicines) to make sure you have good blood sugar levels. o The goal for blood sugar control after surgery is 80-180 mg/dL.   WHAT DO I DO ABOUT MY DIABETES MEDICATION?  Marland Kitchen Do not take oral diabetes medicines (pills) the morning of surgery. .  . The morning of the day before surgery you can take your usual dose of insulin.  . THE NIGHT BEFORE SURGERY, take 50% of your usual dose which is 30 units  of Lantus   insulin.       . THE MORNING OF SURGERY, take  30  units of Lantis   insulin.  . The day of surgery, do not take other diabetes injectables, including Byetta (exenatide), Bydureon (exenatide ER), Victoza (liraglutide), or Trulicity (dulaglutide).                         You may not have  any metal on your body including hair pins and              piercings  Do not wear jewelry, make-up, lotions, powders or perfumes, deodorant             Do not wear nail polish on your fingernails.  Do not shave  48 hours prior to surgery.     Do not bring valuables to the hospital. [place].  Contacts, dentures or bridgework may not be worn into surgery.      Patients discharged the day of surgery will not be allowed to drive home.   IF YOU ARE HAVING SURGERY AND GOING HOME THE SAME DAY, YOU MUST HAVE AN ADULT TO DRIVE YOU HOME AND BE WITH YOU FOR 24 HOURS.   YOU MAY GO HOME BY TAXI OR UBER OR ORTHERWISE, BUT AN ADULT MUST ACCOMPANY YOU HOME AND STAY WITH YOU FOR 24 HOURS.  Name and phone number of your driver:  Special Instructions: N/A              Please read over the following fact sheets you were given: _____________________________________________________________________   [facility]- Preparing for Total Shoulder Arthroplasty    Before surgery, you can play an important role. Because skin is not sterile, your skin needs to be as free of germs as possible. You can reduce the number of germs on your skin by using the following products. . Benzoyl Peroxide Gel o Reduces the number of germs present on the skin o Applied twice a day to shoulder area starting two days before surgery    ==================================================================  Please follow these instructions carefully:  BENZOYL PEROXIDE 5% GEL  Please do not use if you have an allergy to benzoyl peroxide.   If your skin becomes reddened/irritated stop using the benzoyl  peroxide.  Starting two days before surgery, apply as follows: 1. Apply benzoyl peroxide in the morning and at night. Apply after taking a shower. If you are not taking a shower clean entire shoulder front, back, and side along with the armpit with a clean wet washcloth.  2. Place a  quarter-sized dollop on your shoulder and rub in thoroughly, making sure to cover the front, back, and side of your shoulder, along with the armpit.   2 days before ____ AM   ____ PM              1 day before ____ AM   ____ PM                         3. Do this twice a day for two days.  (Last application is the night before surgery, AFTER using the CHG soap as described below).  4. Do NOT apply benzoyl peroxide gel on the day of surgery.           [facility] - Preparing for Surgery  Before surgery, you can play an important role.   Because skin is not sterile, your skin needs to be as free of germs as possible .  You can reduce the number of germs on your skin by washing with CHG (chlorahexidine gluconate) soap before surgery.   CHG is an antiseptic cleaner which kills germs and bonds with the skin to continue killing germs even after washing. Please DO NOT use if you have an allergy to CHG or antibacterial soaps.   If your skin becomes reddened/irritated stop using the CHG and inform your nurse when you arrive at Short Stay. Do not shave (including legs and underarms) for at least 48 hours prior to the first CHG shower.  Please follow these instructions carefully:  1.  Shower with CHG Soap the night before surgery and the  morning of Surgery.  2.  If you choose to wash your hair, wash your hair first as usual with your  normal  shampoo.  3.  After you shampoo, rinse your hair and body thoroughly to remove the  shampoo.                                        4.  Use CHG as you would any other liquid soap.  You can apply chg directly  to the skin and wash                       Gently with a scrungie or clean washcloth.  5.  Apply the CHG Soap to your body ONLY FROM THE NECK DOWN.   Do not use on face/ open                           Wound or open sores. Avoid contact with eyes, ears mouth and genitals (private parts).                       Wash face,  Genitals (private parts) with your  normal soap.             6.  Wash thoroughly, paying special attention to the area where your surgery  will be performed.  7.  Thoroughly rinse your body with warm water  from the neck down.  8.  DO NOT shower/wash with your normal soap after using and rinsing off  the CHG Soap.            9.  Pat yourself dry with a clean towel.            10.  Wear clean pajamas.            11.  Place clean sheets on your bed the night of your first shower and do not  sleep with pets. Day of Surgery : Do not apply any lotions/deodorants the morning of surgery.  Please wear clean clothes to the hospital/surgery center.  FAILURE TO FOLLOW THESE INSTRUCTIONS MAY RESULT IN THE CANCELLATION OF YOUR SURGERY PATIENT SIGNATURE_________________________________  NURSE SIGNATURE__________________________________   Incentive Spirometer  An incentive spirometer is a tool that can help keep your lungs clear and active. This tool measures how well you are filling your lungs with each breath. Taking long deep breaths may help reverse or decrease the chance of developing breathing (pulmonary) problems (especially infection) following:  A long period of time when you are unable to move or be active. BEFORE THE PROCEDURE   If the spirometer includes an indicator to show your best effort, your nurse or respiratory therapist will set it to a desired goal.  If possible, sit up straight or lean slightly forward. Try not to slouch.  Hold the incentive spirometer in an upright position. INSTRUCTIONS FOR USE  1. Sit on the edge of your bed if possible, or sit up as far as you can in bed or on a chair. 2. Hold the incentive spirometer in an upright position. 3. Breathe out normally. 4. Place the mouthpiece in your mouth and seal your lips tightly around it. 5. Breathe in slowly and as deeply as possible, raising the piston or the ball toward the top of the column. 6. Hold your breath for 3-5 seconds or for as long as  possible. Allow the piston or ball to fall to the bottom of the column. 7. Remove the mouthpiece from your mouth and breathe out normally. 8. Rest for a few seconds and repeat Steps 1 through 7 at least 10 times every 1-2 hours when you are awake. Take your time and take a few normal breaths between deep breaths. 9. The spirometer may include an indicator to show your best effort. Use the indicator as a goal to work toward during each repetition. 10. After each set of 10 deep breaths, practice coughing to be sure your lungs are clear. If you have an incision (the cut made at the time of surgery), support your incision when coughing by placing a pillow or rolled up towels firmly against it. Once you are able to get out of bed, walk around indoors and cough well. You may stop using the incentive spirometer when instructed by your caregiver.  RISKS AND COMPLICATIONS  Take your time so you do not get dizzy or light-headed.  If you are in pain, you may need to take or ask for pain medication before doing incentive spirometry. It is harder to take a deep breath if you are having pain. AFTER USE  Rest and breathe slowly and easily.  It can be helpful to keep track of a log of your progress. Your caregiver can provide you with a simple table to help with this. If you are using the spirometer at home, follow these instructions: [place] IF:   You are  having difficultly using the spirometer.  You have trouble using the spirometer as often as instructed.  Your pain medication is not giving enough relief while using the spirometer.  You develop fever of 100.5 F (38.1 C) or higher. SEEK IMMEDIATE MEDICAL CARE IF:   You cough up bloody sputum that had not been present before.  You develop fever of 102 F (38.9 C) or greater.  You develop worsening pain at or near the incision site. MAKE SURE YOU:   Understand these instructions.  Will watch your condition.  Will get help right  away if you are not doing well or get worse. Document Released: 09/29/2006 Document Revised: 08/11/2011 Document Reviewed: 11/30/2006 [facility] Patient Information 2014 [employer], [place].   ________________________________________________________________________  ________________________________________________________________________

## 2019-08-12 ENCOUNTER — Encounter (HOSPITAL_COMMUNITY)
Admission: RE | Admit: 2019-08-12 | Discharge: 2019-08-12 | Disposition: A | Discharge status: Home / Self Care | Payer: Medicare Other | Source: Ambulatory Visit | Attending: Orthopedic Surgery | Admitting: Orthopedic Surgery

## 2019-08-12 ENCOUNTER — Other Ambulatory Visit: Payer: Self-pay

## 2019-08-12 ENCOUNTER — Encounter (HOSPITAL_COMMUNITY): Payer: Self-pay

## 2019-08-12 DIAGNOSIS — G2581 Restless legs syndrome: Secondary | ICD-10-CM | POA: Diagnosis not present

## 2019-08-12 DIAGNOSIS — E785 Hyperlipidemia, unspecified: Secondary | ICD-10-CM | POA: Insufficient documentation

## 2019-08-12 DIAGNOSIS — I110 Hypertensive heart disease with heart failure: Secondary | ICD-10-CM | POA: Diagnosis not present

## 2019-08-12 DIAGNOSIS — I2510 Atherosclerotic heart disease of native coronary artery without angina pectoris: Secondary | ICD-10-CM | POA: Insufficient documentation

## 2019-08-12 DIAGNOSIS — Z79899 Other long term (current) drug therapy: Secondary | ICD-10-CM | POA: Insufficient documentation

## 2019-08-12 DIAGNOSIS — Z794 Long term (current) use of insulin: Secondary | ICD-10-CM | POA: Insufficient documentation

## 2019-08-12 DIAGNOSIS — M19011 Primary osteoarthritis, right shoulder: Secondary | ICD-10-CM | POA: Diagnosis not present

## 2019-08-12 DIAGNOSIS — Z7901 Long term (current) use of anticoagulants: Secondary | ICD-10-CM | POA: Diagnosis not present

## 2019-08-12 DIAGNOSIS — Z01812 Encounter for preprocedural laboratory examination: Secondary | ICD-10-CM | POA: Diagnosis not present

## 2019-08-12 DIAGNOSIS — G4700 Insomnia, unspecified: Secondary | ICD-10-CM | POA: Diagnosis not present

## 2019-08-12 DIAGNOSIS — I5032 Chronic diastolic (congestive) heart failure: Secondary | ICD-10-CM | POA: Insufficient documentation

## 2019-08-12 DIAGNOSIS — I482 Chronic atrial fibrillation, unspecified: Secondary | ICD-10-CM | POA: Insufficient documentation

## 2019-08-12 DIAGNOSIS — E118 Type 2 diabetes mellitus with unspecified complications: Secondary | ICD-10-CM | POA: Insufficient documentation

## 2019-08-12 DIAGNOSIS — F319 Bipolar disorder, unspecified: Secondary | ICD-10-CM | POA: Diagnosis not present

## 2019-08-12 HISTORY — DX: Cardiac arrhythmia, unspecified: I49.9

## 2019-08-12 LAB — HEMOGLOBIN A1C
Hgb A1c MFr Bld: 6.7 % — ABNORMAL HIGH (ref 4.8–5.6)
Mean Plasma Glucose: 145.59 mg/dL

## 2019-08-12 LAB — CBC
HCT: 43.5 % (ref 36.0–46.0)
Hemoglobin: 14 g/dL (ref 12.0–15.0)
MCH: 30.2 pg (ref 26.0–34.0)
MCHC: 32.2 g/dL (ref 30.0–36.0)
MCV: 93.8 fL (ref 80.0–100.0)
Platelets: 182 10*3/uL (ref 150–400)
RBC: 4.64 MIL/uL (ref 3.87–5.11)
RDW: 13.3 % (ref 11.5–15.5)
WBC: 9 10*3/uL (ref 4.0–10.5)
nRBC: 0 % (ref 0.0–0.2)

## 2019-08-12 LAB — BASIC METABOLIC PANEL
Anion gap: 8 (ref 5–15)
BUN: 21 mg/dL (ref 8–23)
CO2: 29 mmol/L (ref 22–32)
Calcium: 9.3 mg/dL (ref 8.9–10.3)
Chloride: 105 mmol/L (ref 98–111)
Creatinine, Ser: 1 mg/dL (ref 0.44–1.00)
GFR calc Af Amer: >60 mL/min (ref >60)
GFR calc non Af Amer: 56 mL/min — ABNORMAL LOW (ref >60)
Glucose, Bld: 223 mg/dL — ABNORMAL HIGH (ref 70–99)
Potassium: 4.7 mmol/L (ref 3.5–5.1)
Sodium: 142 mmol/L (ref 135–145)

## 2019-08-12 LAB — SURGICAL PCR SCREEN
MRSA, PCR: NEGATIVE
Staphylococcus aureus: NEGATIVE

## 2019-08-12 NOTE — Progress Notes (Signed)
PCP - Darla Lesches FNP Cardiologist - Dr. Bronson Ing  Chest x-ray - 03/11/19 EKG - 05/20/19 Stress Test - no ECHO - 01/22/19 Cardiac Cath - 02/03/18  Sleep Study - yes CPAP - She is waiting for her machine  Fasting Blood Sugar - 100-110 Checks Blood Sugar QD_____ times a day  Blood Thinner Instructions:Eliquis for A-fib Aspirin Instructions:Dr. Veverly Fells said to stop on 08/17/19 Last Dose:08/17/19  Anesthesia review:   Patient denies shortness of breath, fever, cough and chest pain at PAT appointment yes  Patient verbalized understanding of instructions that were given to them at the PAT appointment. Patient was also instructed that they will need to review over the PAT instructions again at home before surgery. yes

## 2019-08-15 DIAGNOSIS — Z23 Encounter for immunization: Secondary | ICD-10-CM | POA: Diagnosis not present

## 2019-08-15 NOTE — Progress Notes (Addendum)
Anesthesia Chart Review   Case: [id number] Date/Time: 08/19/19 0715   Procedure: REVERSE SHOULDER ARTHROPLASTY (Right Shoulder) - interscalene block   Anesthesia type: General   Pre-op diagnosis: Right shoulder osteoarthritis   Location: [facility] ROOM 06 / [facility]   Surgeons: Netta Cedars, MD      DISCUSSION:74 y.o. never smoker with h/o HLD, DM II, HTN, nonobstructive CAD, chronic diastolic heart failure, atrial fibrillation (Eliquis), NAFL, OSA, right shoulder OA scheduled for above procedure 08/19/19 with Dr. Netta Cedars.   Pt last seen by cardiologist, Dr. Bronson Ing, 03/15/2019.  Per OV note PAF symptomatically stable, on diltiazem and anticoagulated with Eliquis. Chronic diastolic hear failure, symptomatically improved with prn Lasix.  HTN controlled.  6 month follow up recommended.  Pt advised to hold Eliquis 08/17/19.   Clearance received from Dr. Bronson Ing, on chart, which states pt is low risk for surgical procedure.   Anticipate pt can proceed with planned procedure barring acute status change.   VS: BP 131/66 (BP Location: Right Arm)   Pulse 67   Temp 36.5 C (Oral)   Resp 18   Ht 5\' 2"  (1.575 m)   Wt 88.6 kg   SpO2 97%   BMI 35.71 kg/m   PROVIDERS: Rakes, Connye Burkitt, FNP is PCP   Kate Sable, MD is Cardiologist  LABS: Glucose 223, A1C 6.7, evaluate DOS (all labs ordered are listed, but only abnormal results are displayed)  Labs Reviewed - No data to display   IMAGES:   EKG: 05/20/2019 54 bpm Atrial fibrillation  -Prominent R(V1) and left axis -nonspecific  -Seen with pulmonary disease -possible anterior fascicular block.   -Anterior infarct -age undetermined.   -Nonspecific ST depression   +   Negative T-waves  -Nondiagnostic  -Possible  Lateral  ischemia.   CV: Echo 01/22/2019 IMPRESSIONS   1. The left ventricle has normal systolic function, with an ejection  fraction of 55-60%. The cavity size was normal. There is mildly increased  left ventricular  wall thickness. Left ventricular diastolic function could  not be evaluated secondary to  atrial fibrillation.  2. The right ventricle has normal systolic function. The cavity was  normal. There is no increase in right ventricular wall thickness.  3. The mitral valve is grossly normal.  4. The aortic valve is grossly normal. Aortic valve regurgitation is mild  by color flow Doppler.  5. The aorta is normal unless otherwise noted.  6. The aortic root and ascending aorta are normal in size and structure.  7. The atrial septum is grossly normal.   Cardiac Cath 02/04/2019  Prox LAD lesion is 40% stenosed.  Mid LAD lesion is 30% stenosed.  Ost 1st Mrg to 1st Mrg lesion is 30% stenosed.  LV end diastolic pressure is moderately elevated.   1. Nonobstructive CAD with left dominant circulation 2. Moderately elevated LVEDP (post hydration)  Plan: medical therapy. Will resume Eliquis this pm. Continue diuretic therapy.  No indication for antiplatelet therapy at this time  06/20/15 Myocardial Perfusion   There was no ST segment deviation noted during stress.  The study is normal.  This is a low risk study.  The left ventricular ejection fraction is normal (55-65%). Past Medical History:  Diagnosis Date  . Atrial fibrillation with RVR 05/19/2017  . Bipolar I disorder 01/23/2015  . CAD (coronary artery disease)    Nonobstructive; Managed by Dr. Bronson Ing  . Cardiomegaly 01/12/2018  . Chronic anticoagulation 05/30/2015  . Chronic back pain    Lower back  .  Chronic diastolic CHF (congestive heart failure) 05/30/2015  . Diabetes mellitus, type 2, without complication   . Diabetic neuropathy 02/06/2016  . Dysrhythmia    A-Fib  . Essential hypertension   . Herpes genitalis in women 07/16/2015  . Hyperlipidemia   . Hypertension   . Hypoxia 02/01/2019  . Insomnia 01/23/2015  . Lipoma 02/08/2015  . Major depressive disorder   . Migraine headache with aura 02/12/2016  . Mild  vascular neurocognitive disorder ([place]) 04/21/2019  . NAFL (nonalcoholic fatty liver) [id number]  . OSA (obstructive sleep apnea) 02/24/2019   10/09/2018 - HST  - AHI 40.6   . Pulmonary hypertension   . Renal insufficiency    Managed by Dr. Wallace Keller  . RLS (restless legs syndrome) 04/27/2015    Past Surgical History:  Procedure Laterality Date  . BREAST REDUCTION SURGERY    . EYE SURGERY Right    cateracts  . HAMMER TOE SURGERY    . LEFT HEART CATH AND CORONARY ANGIOGRAPHY N/A 02/03/2018   Procedure: LEFT HEART CATH AND CORONARY ANGIOGRAPHY;  Surgeon: Martinique, Peter M, MD;  Location: [facility];  Service: Cardiovascular;  Laterality: N/A;  . SHOULDER SURGERY Right    "I BROKE MY SHOUDLER  . THIGH SURGERY     "TO REMOVE A TUMOR "    MEDICATIONS: . acetaminophen (TYLENOL) 325 MG tablet  . ARIPiprazole (ABILIFY) 10 MG tablet  . atorvastatin (LIPITOR) 40 MG tablet  . budesonide-formoterol (SYMBICORT) 80-4.5 MCG/ACT inhaler  . carbamazepine (CARBATROL) 100 MG 12 hr capsule  . Cholecalciferol (VITAMIN D3) 1000 UNITS CAPS  . diltiazem (CARDIZEM CD) 120 MG 24 hr capsule  . ELIQUIS 5 MG TABS tablet  . Eszopiclone (ESZOPICLONE) 3 MG TABS  . furosemide (LASIX) 40 MG tablet  . ibuprofen (ADVIL) 800 MG tablet  . Insulin Glargine (LANTUS SOLOSTAR) 100 UNIT/ML Solostar Pen  . LORazepam (ATIVAN) 1 MG tablet  . metoprolol tartrate (LOPRESSOR) 100 MG tablet  . metoprolol tartrate (LOPRESSOR) 25 MG tablet  . naloxone (NARCAN) nasal spray 4 mg/0.1 mL  . oxyCODONE-acetaminophen (PERCOCET/ROXICET) 5-325 MG tablet  . potassium chloride SA (KLOR-CON) 20 MEQ tablet  . pregabalin (LYRICA) 150 MG capsule  . PROAIR HFA 108 (90 Base) MCG/ACT inhaler  . Semaglutide (RYBELSUS) 7 MG TABS  . SUMAtriptan (IMITREX) 100 MG tablet  . topiramate (TOPAMAX) 25 MG tablet  . traZODone (DESYREL) 100 MG tablet  . triamcinolone ointment (KENALOG) 0.5 %  . VASCEPA 1 g CAPS   No current facility-administered  medications for this encounter.   . bupivacaine-epinephrine (MARCAINE W/ EPI) 0.5% -1:200000 injection    Maia Plan [facility] Pre-Surgical Testing [phone] 08/16/19  3:38 PM

## 2019-08-16 ENCOUNTER — Other Ambulatory Visit (HOSPITAL_COMMUNITY): Payer: Medicare Other

## 2019-08-16 ENCOUNTER — Other Ambulatory Visit (HOSPITAL_COMMUNITY)
Admission: RE | Admit: 2019-08-16 | Discharge: 2019-08-16 | Disposition: A | Discharge status: Home / Self Care | Payer: Medicare Other | Source: Ambulatory Visit | Attending: Orthopedic Surgery | Admitting: Orthopedic Surgery

## 2019-08-16 DIAGNOSIS — Z79899 Other long term (current) drug therapy: Secondary | ICD-10-CM | POA: Diagnosis not present

## 2019-08-16 DIAGNOSIS — M545 Low back pain: Secondary | ICD-10-CM | POA: Diagnosis not present

## 2019-08-16 DIAGNOSIS — M5136 Other intervertebral disc degeneration, lumbar region: Secondary | ICD-10-CM | POA: Diagnosis not present

## 2019-08-16 DIAGNOSIS — S32010A Wedge compression fracture of first lumbar vertebra, initial encounter for closed fracture: Secondary | ICD-10-CM | POA: Diagnosis not present

## 2019-08-16 DIAGNOSIS — Z20822 Contact with and (suspected) exposure to covid-19: Secondary | ICD-10-CM | POA: Insufficient documentation

## 2019-08-16 DIAGNOSIS — Z01812 Encounter for preprocedural laboratory examination: Secondary | ICD-10-CM | POA: Insufficient documentation

## 2019-08-16 LAB — SARS CORONAVIRUS 2 (TAT 6-24 HRS): SARS Coronavirus 2: NEGATIVE

## 2019-08-18 NOTE — Anesthesia Preprocedure Evaluation (Addendum)
Anesthesia Evaluation  Patient identified by MRN, date of birth, ID band Patient awake    Reviewed: Allergy & Precautions, NPO status , Patient's Chart, lab work & pertinent test results  History of Anesthesia Complications Negative for: history of anesthetic complications  Airway Mallampati: II  TM Distance: >3 FB Neck ROM: Full    Dental  (+) Missing, Poor Dentition, Dental Advisory Given   Pulmonary asthma , sleep apnea ,    Pulmonary exam normal        Cardiovascular hypertension, Pt. on medications and Pt. on home beta blockers + CAD (nonobstructive) and +CHF  + dysrhythmias Atrial Fibrillation  Rhythm:Irregular Rate:Normal     Neuro/Psych  Headaches, PSYCHIATRIC DISORDERS Depression Bipolar Disorder    GI/Hepatic negative GI ROS, Neg liver ROS,   Endo/Other  diabetes, Type 2, Insulin Dependent  Renal/GU negative Renal ROS  negative genitourinary   Musculoskeletal  (+) Arthritis , Osteoarthritis,  Fibromyalgia -, narcotic dependent  Abdominal   Peds  Hematology Eliquis   Anesthesia Other Findings  Pt last seen by cardiologist, Dr. Bronson Ing, 03/15/2019.  Per OV note PAF symptomatically stable, on diltiazem and anticoagulated with Eliquis. Chronic diastolic hear failure, symptomatically improved with prn Lasix.  HTN controlled.  6 month follow up recommended.  Pt advised to hold Eliquis 08/17/19. Clearance received from Dr. Bronson Ing, on chart, which states pt is low risk for surgical procedure.  Echo 01/22/19: EF 55-60%, mild LVH, normal RV, mild AI  Cath 02/04/19: nonobstructive CAD   Reproductive/Obstetrics                           Anesthesia Physical Anesthesia Plan  ASA: III  Anesthesia Plan: General   Post-op Pain Management: GA combined w/ Regional for post-op pain   Induction: Intravenous  PONV Risk Score and Plan: 3 and Ondansetron, Dexamethasone, Treatment may vary due  to age or medical condition and Midazolam  Airway Management Planned: Oral ETT  Additional Equipment: None  Intra-op Plan:   Post-operative Plan: Extubation in OR  Informed Consent: I have reviewed the patients History and Physical, chart, labs and discussed the procedure including the risks, benefits and alternatives for the proposed anesthesia with the patient or authorized representative who has indicated his/her understanding and acceptance.     Dental advisory given  Plan Discussed with:   Anesthesia Plan Comments:        Anesthesia Quick Evaluation

## 2019-08-19 ENCOUNTER — Inpatient Hospital Stay (HOSPITAL_COMMUNITY): Payer: Medicare Other

## 2019-08-19 ENCOUNTER — Ambulatory Visit (HOSPITAL_COMMUNITY): Payer: Medicare Other

## 2019-08-19 ENCOUNTER — Encounter (HOSPITAL_COMMUNITY): Payer: Self-pay | Admitting: Orthopedic Surgery

## 2019-08-19 ENCOUNTER — Observation Stay (HOSPITAL_COMMUNITY)
Admission: AD | Admit: 2019-08-19 | Discharge: 2019-08-20 | Disposition: A | Discharge status: Home / Self Care | Payer: Medicare Other | Attending: Orthopedic Surgery | Admitting: Orthopedic Surgery

## 2019-08-19 ENCOUNTER — Ambulatory Visit (HOSPITAL_COMMUNITY): Payer: Medicare Other | Admitting: Physician Assistant

## 2019-08-19 ENCOUNTER — Encounter (HOSPITAL_COMMUNITY)
Admission: AD | Disposition: A | Discharge status: Home / Self Care | Payer: Self-pay | Source: Home / Self Care | Attending: Orthopedic Surgery

## 2019-08-19 ENCOUNTER — Other Ambulatory Visit: Payer: Self-pay

## 2019-08-19 DIAGNOSIS — Z79899 Other long term (current) drug therapy: Secondary | ICD-10-CM | POA: Insufficient documentation

## 2019-08-19 DIAGNOSIS — Z888 Allergy status to other drugs, medicaments and biological substances status: Secondary | ICD-10-CM | POA: Diagnosis not present

## 2019-08-19 DIAGNOSIS — Z96611 Presence of right artificial shoulder joint: Secondary | ICD-10-CM

## 2019-08-19 DIAGNOSIS — I2510 Atherosclerotic heart disease of native coronary artery without angina pectoris: Secondary | ICD-10-CM | POA: Diagnosis not present

## 2019-08-19 DIAGNOSIS — M19011 Primary osteoarthritis, right shoulder: Secondary | ICD-10-CM | POA: Diagnosis not present

## 2019-08-19 DIAGNOSIS — E785 Hyperlipidemia, unspecified: Secondary | ICD-10-CM | POA: Diagnosis not present

## 2019-08-19 DIAGNOSIS — Z7901 Long term (current) use of anticoagulants: Secondary | ICD-10-CM | POA: Diagnosis not present

## 2019-08-19 DIAGNOSIS — Z794 Long term (current) use of insulin: Secondary | ICD-10-CM | POA: Insufficient documentation

## 2019-08-19 DIAGNOSIS — I110 Hypertensive heart disease with heart failure: Secondary | ICD-10-CM | POA: Diagnosis not present

## 2019-08-19 DIAGNOSIS — Z471 Aftercare following joint replacement surgery: Secondary | ICD-10-CM | POA: Diagnosis not present

## 2019-08-19 DIAGNOSIS — K760 Fatty (change of) liver, not elsewhere classified: Secondary | ICD-10-CM | POA: Insufficient documentation

## 2019-08-19 DIAGNOSIS — G4733 Obstructive sleep apnea (adult) (pediatric): Secondary | ICD-10-CM | POA: Insufficient documentation

## 2019-08-19 DIAGNOSIS — J45909 Unspecified asthma, uncomplicated: Secondary | ICD-10-CM | POA: Insufficient documentation

## 2019-08-19 DIAGNOSIS — G8918 Other acute postprocedural pain: Secondary | ICD-10-CM | POA: Diagnosis not present

## 2019-08-19 DIAGNOSIS — I5032 Chronic diastolic (congestive) heart failure: Secondary | ICD-10-CM | POA: Diagnosis not present

## 2019-08-19 DIAGNOSIS — Z91041 Radiographic dye allergy status: Secondary | ICD-10-CM | POA: Diagnosis not present

## 2019-08-19 DIAGNOSIS — F329 Major depressive disorder, single episode, unspecified: Secondary | ICD-10-CM | POA: Insufficient documentation

## 2019-08-19 DIAGNOSIS — M7581 Other shoulder lesions, right shoulder: Secondary | ICD-10-CM | POA: Diagnosis not present

## 2019-08-19 DIAGNOSIS — E1140 Type 2 diabetes mellitus with diabetic neuropathy, unspecified: Secondary | ICD-10-CM | POA: Insufficient documentation

## 2019-08-19 HISTORY — PX: REVERSE SHOULDER ARTHROPLASTY: SHX5054

## 2019-08-19 HISTORY — DX: Presence of right artificial shoulder joint: Z96.611

## 2019-08-19 LAB — GLUCOSE, CAPILLARY
Glucose-Capillary: 142 mg/dL — ABNORMAL HIGH (ref 70–99)
Glucose-Capillary: 104 mg/dL — ABNORMAL HIGH (ref 70–99)
Glucose-Capillary: 153 mg/dL — ABNORMAL HIGH (ref 70–99)
Glucose-Capillary: 183 mg/dL — ABNORMAL HIGH (ref 70–99)
Glucose-Capillary: 278 mg/dL — ABNORMAL HIGH (ref 70–99)

## 2019-08-19 SURGERY — ARTHROPLASTY, SHOULDER, TOTAL, REVERSE
Anesthesia: General | Site: Shoulder | Laterality: Right

## 2019-08-19 MED ORDER — METOPROLOL TARTRATE 50 MG PO TABS: 112.5000 mg | ORAL_TABLET | Freq: Two times a day (BID) | ORAL | Status: DC

## 2019-08-19 MED ORDER — ROCURONIUM BROMIDE 10 MG/ML (PF) SYRINGE
PREFILLED_SYRINGE | INTRAVENOUS | Status: AC
  Filled 2019-08-19: qty 10

## 2019-08-19 MED ORDER — ROCURONIUM BROMIDE 10 MG/ML (PF) SYRINGE
PREFILLED_SYRINGE | INTRAVENOUS | Status: DC | PRN
  Administered 2019-08-19: 60 mg via INTRAVENOUS

## 2019-08-19 MED ORDER — FUROSEMIDE 40 MG PO TABS
40.0000 mg | ORAL_TABLET | Freq: Every day | ORAL | Status: DC
  Administered 2019-08-19: 40 mg via ORAL
  Filled 2019-08-19 (×3): qty 1

## 2019-08-19 MED ORDER — PROPOFOL 10 MG/ML IV BOLUS
INTRAVENOUS | Status: AC
  Filled 2019-08-19: qty 20

## 2019-08-19 MED ORDER — DILTIAZEM HCL 30 MG PO TABS
30.0000 mg | ORAL_TABLET | Freq: Two times a day (BID) | ORAL | Status: DC
  Administered 2019-08-19 – 2019-08-20 (×2): 30 mg via ORAL
  Filled 2019-08-19 (×3): qty 1

## 2019-08-19 MED ORDER — SEMAGLUTIDE 7 MG PO TABS
7.0000 mg | ORAL_TABLET | Freq: Every day | ORAL | Status: DC
  Filled 2019-08-19: qty 1

## 2019-08-19 MED ORDER — METOCLOPRAMIDE HCL 5 MG PO TABS: 5.0000 mg | ORAL_TABLET | Freq: Three times a day (TID) | ORAL | Status: DC | PRN

## 2019-08-19 MED ORDER — INSULIN ASPART 100 UNIT/ML ~~LOC~~ SOLN
4.0000 [IU] | Freq: Three times a day (TID) | SUBCUTANEOUS | Status: DC
  Administered 2019-08-19 – 2019-08-20 (×3): 4 [IU] via SUBCUTANEOUS

## 2019-08-19 MED ORDER — ATORVASTATIN CALCIUM 40 MG PO TABS
40.0000 mg | ORAL_TABLET | Freq: Every day | ORAL | Status: DC
  Administered 2019-08-19 – 2019-08-20 (×2): 40 mg via ORAL
  Filled 2019-08-19 (×2): qty 1

## 2019-08-19 MED ORDER — METOPROLOL TARTRATE 50 MG PO TABS
75.0000 mg | ORAL_TABLET | Freq: Two times a day (BID) | ORAL | Status: DC
  Administered 2019-08-19 – 2019-08-20 (×2): 75 mg via ORAL
  Filled 2019-08-19 (×2): qty 1

## 2019-08-19 MED ORDER — BUPIVACAINE-EPINEPHRINE 0.5% -1:200000 IJ SOLN
INTRAMUSCULAR | Status: DC | PRN
  Administered 2019-08-19: 10 mL

## 2019-08-19 MED ORDER — PREGABALIN 75 MG PO CAPS
150.0000 mg | ORAL_CAPSULE | Freq: Two times a day (BID) | ORAL | Status: DC
  Administered 2019-08-19 – 2019-08-20 (×2): 150 mg via ORAL
  Filled 2019-08-19 (×2): qty 2

## 2019-08-19 MED ORDER — PROPOFOL 10 MG/ML IV BOLUS
INTRAVENOUS | Status: DC | PRN
  Administered 2019-08-19: 130 mg via INTRAVENOUS

## 2019-08-19 MED ORDER — MENTHOL 3 MG MT LOZG: 1.0000 | LOZENGE | OROMUCOSAL | Status: DC | PRN

## 2019-08-19 MED ORDER — STERILE WATER FOR IRRIGATION IR SOLN
Status: DC | PRN
  Administered 2019-08-19: 1000 mL

## 2019-08-19 MED ORDER — DEXAMETHASONE SODIUM PHOSPHATE 10 MG/ML IJ SOLN
INTRAMUSCULAR | Status: AC
  Filled 2019-08-19: qty 1

## 2019-08-19 MED ORDER — PHENOL 1.4 % MT LIQD: 1.0000 | OROMUCOSAL | Status: DC | PRN

## 2019-08-19 MED ORDER — BUPIVACAINE-EPINEPHRINE 0.5% -1:200000 IJ SOLN
INTRAMUSCULAR | Status: AC
  Filled 2019-08-19: qty 2

## 2019-08-19 MED ORDER — SODIUM CHLORIDE 0.9 % IR SOLN
Status: DC | PRN
  Administered 2019-08-19: 1000 mL

## 2019-08-19 MED ORDER — METHOCARBAMOL 500 MG PO TABS
500.0000 mg | ORAL_TABLET | Freq: Four times a day (QID) | ORAL | Status: DC | PRN
  Administered 2019-08-19 – 2019-08-20 (×2): 500 mg via ORAL
  Filled 2019-08-19 (×2): qty 1

## 2019-08-19 MED ORDER — ACETAMINOPHEN 325 MG PO TABS: 325.0000 mg | ORAL_TABLET | Freq: Four times a day (QID) | ORAL | Status: DC | PRN

## 2019-08-19 MED ORDER — ARIPIPRAZOLE 10 MG PO TABS
10.0000 mg | ORAL_TABLET | Freq: Two times a day (BID) | ORAL | Status: DC
  Administered 2019-08-19 – 2019-08-20 (×2): 10 mg via ORAL
  Filled 2019-08-19 (×3): qty 1

## 2019-08-19 MED ORDER — LIDOCAINE 2% (20 MG/ML) 5 ML SYRINGE
INTRAMUSCULAR | Status: DC | PRN
  Administered 2019-08-19: 60 mg via INTRAVENOUS

## 2019-08-19 MED ORDER — PHENYLEPHRINE 40 MCG/ML (10ML) SYRINGE FOR IV PUSH (FOR BLOOD PRESSURE SUPPORT)
PREFILLED_SYRINGE | INTRAVENOUS | Status: AC
  Filled 2019-08-19: qty 10

## 2019-08-19 MED ORDER — ALBUTEROL SULFATE (2.5 MG/3ML) 0.083% IN NEBU: 2.5000 mg | INHALATION_SOLUTION | Freq: Four times a day (QID) | RESPIRATORY_TRACT | Status: DC | PRN

## 2019-08-19 MED ORDER — HYDROMORPHONE HCL 1 MG/ML IJ SOLN: 0.5000 mg | INTRAMUSCULAR | Status: DC | PRN

## 2019-08-19 MED ORDER — DILTIAZEM HCL ER COATED BEADS 120 MG PO CP24: 120.0000 mg | ORAL_CAPSULE | Freq: Every day | ORAL | Status: DC

## 2019-08-19 MED ORDER — BUPIVACAINE HCL (PF) 0.25 % IJ SOLN
INTRAMUSCULAR | Status: AC
  Filled 2019-08-19: qty 30

## 2019-08-19 MED ORDER — NALOXONE HCL 0.4 MG/ML IJ SOLN: 0.4000 mg | INTRAMUSCULAR | Status: DC | PRN

## 2019-08-19 MED ORDER — LACTATED RINGERS IV SOLN: INTRAVENOUS | Status: DC

## 2019-08-19 MED ORDER — ONDANSETRON HCL 4 MG/2ML IJ SOLN: 4.0000 mg | Freq: Once | INTRAMUSCULAR | Status: DC | PRN

## 2019-08-19 MED ORDER — LORAZEPAM 1 MG PO TABS
1.0000 mg | ORAL_TABLET | Freq: Every day | ORAL | Status: DC
  Administered 2019-08-20: 1 mg via ORAL
  Filled 2019-08-19: qty 1

## 2019-08-19 MED ORDER — POLYETHYLENE GLYCOL 3350 17 G PO PACK: 17.0000 g | PACK | Freq: Every day | ORAL | Status: DC | PRN

## 2019-08-19 MED ORDER — LORAZEPAM 1 MG PO TABS
2.0000 mg | ORAL_TABLET | Freq: Every day | ORAL | Status: DC
  Administered 2019-08-19: 2 mg via ORAL
  Filled 2019-08-19: qty 2

## 2019-08-19 MED ORDER — EPHEDRINE 5 MG/ML INJ
INTRAVENOUS | Status: AC
  Filled 2019-08-19: qty 10

## 2019-08-19 MED ORDER — OXYCODONE-ACETAMINOPHEN 5-325 MG PO TABS: 1.0000 | ORAL_TABLET | Freq: Four times a day (QID) | ORAL | 0 refills | Status: DC | PRN

## 2019-08-19 MED ORDER — ICOSAPENT ETHYL 1 G PO CAPS
2.0000 g | ORAL_CAPSULE | Freq: Two times a day (BID) | ORAL | Status: DC
  Administered 2019-08-19 – 2019-08-20 (×2): 2 g via ORAL
  Filled 2019-08-19 (×4): qty 2

## 2019-08-19 MED ORDER — PHENYLEPHRINE HCL-NACL 10-0.9 MG/250ML-% IV SOLN
INTRAVENOUS | Status: DC | PRN
  Administered 2019-08-19: 50 ug/min via INTRAVENOUS

## 2019-08-19 MED ORDER — SODIUM CHLORIDE 0.9 % IV SOLN: INTRAVENOUS | Status: DC

## 2019-08-19 MED ORDER — MOMETASONE FURO-FORMOTEROL FUM 100-5 MCG/ACT IN AERO
2.0000 | INHALATION_SPRAY | Freq: Two times a day (BID) | RESPIRATORY_TRACT | Status: DC
  Administered 2019-08-19: 2 via RESPIRATORY_TRACT
  Filled 2019-08-19: qty 8.8

## 2019-08-19 MED ORDER — ACETAMINOPHEN 325 MG PO TABS
325.0000 mg | ORAL_TABLET | Freq: Four times a day (QID) | ORAL | Status: DC | PRN
  Administered 2019-08-19: 650 mg via ORAL
  Filled 2019-08-19: qty 2

## 2019-08-19 MED ORDER — CHLORHEXIDINE GLUCONATE 4 % EX LIQD: 60.0000 mL | Freq: Once | CUTANEOUS | Status: DC

## 2019-08-19 MED ORDER — POTASSIUM CHLORIDE CRYS ER 20 MEQ PO TBCR
20.0000 meq | EXTENDED_RELEASE_TABLET | Freq: Every evening | ORAL | Status: DC
  Administered 2019-08-19: 20 meq via ORAL
  Filled 2019-08-19: qty 1

## 2019-08-19 MED ORDER — INSULIN ASPART 100 UNIT/ML ~~LOC~~ SOLN
0.0000 [IU] | Freq: Three times a day (TID) | SUBCUTANEOUS | Status: DC
  Administered 2019-08-19 – 2019-08-20 (×3): 3 [IU] via SUBCUTANEOUS

## 2019-08-19 MED ORDER — CARBAMAZEPINE ER 100 MG PO TB12
100.0000 mg | ORAL_TABLET | Freq: Every day | ORAL | Status: DC
  Administered 2019-08-19 – 2019-08-20 (×2): 100 mg via ORAL
  Filled 2019-08-19 (×3): qty 1

## 2019-08-19 MED ORDER — ONDANSETRON HCL 4 MG/2ML IJ SOLN
INTRAMUSCULAR | Status: AC
  Filled 2019-08-19: qty 2

## 2019-08-19 MED ORDER — METOCLOPRAMIDE HCL 5 MG/ML IJ SOLN: 5.0000 mg | Freq: Three times a day (TID) | INTRAMUSCULAR | Status: DC | PRN

## 2019-08-19 MED ORDER — TOPIRAMATE 25 MG PO TABS
75.0000 mg | ORAL_TABLET | Freq: Every day | ORAL | Status: DC
  Administered 2019-08-19: 75 mg via ORAL
  Filled 2019-08-19: qty 3

## 2019-08-19 MED ORDER — TRAZODONE HCL 100 MG PO TABS
400.0000 mg | ORAL_TABLET | Freq: Every day | ORAL | Status: DC
  Filled 2019-08-19: qty 4

## 2019-08-19 MED ORDER — ONDANSETRON HCL 4 MG/2ML IJ SOLN
INTRAMUSCULAR | Status: DC | PRN
  Administered 2019-08-19: 4 mg via INTRAVENOUS

## 2019-08-19 MED ORDER — PHENYLEPHRINE 40 MCG/ML (10ML) SYRINGE FOR IV PUSH (FOR BLOOD PRESSURE SUPPORT)
PREFILLED_SYRINGE | INTRAVENOUS | Status: DC | PRN
  Administered 2019-08-19 (×2): 80 ug via INTRAVENOUS

## 2019-08-19 MED ORDER — METOPROLOL TARTRATE 12.5 MG HALF TABLET: 12.5000 mg | ORAL_TABLET | Freq: Two times a day (BID) | ORAL | Status: DC

## 2019-08-19 MED ORDER — LIDOCAINE 2% (20 MG/ML) 5 ML SYRINGE
INTRAMUSCULAR | Status: AC
  Filled 2019-08-19: qty 5

## 2019-08-19 MED ORDER — ONDANSETRON HCL 4 MG/2ML IJ SOLN
4.0000 mg | Freq: Four times a day (QID) | INTRAMUSCULAR | Status: DC | PRN
  Administered 2019-08-19 (×2): 4 mg via INTRAVENOUS
  Filled 2019-08-19 (×2): qty 2

## 2019-08-19 MED ORDER — CEFAZOLIN SODIUM-DEXTROSE 2-4 GM/100ML-% IV SOLN
2.0000 g | Freq: Four times a day (QID) | INTRAVENOUS | Status: AC
  Administered 2019-08-19 – 2019-08-20 (×3): 2 g via INTRAVENOUS
  Filled 2019-08-19 (×3): qty 100

## 2019-08-19 MED ORDER — OXYCODONE HCL 5 MG PO TABS
5.0000 mg | ORAL_TABLET | ORAL | Status: DC | PRN
  Administered 2019-08-19 – 2019-08-20 (×3): 10 mg via ORAL
  Filled 2019-08-19 (×3): qty 2

## 2019-08-19 MED ORDER — NALOXONE HCL 4 MG/0.1ML NA LIQD: 1.0000 | Freq: Once | NASAL | Status: DC | PRN

## 2019-08-19 MED ORDER — BUPIVACAINE-EPINEPHRINE (PF) 0.5% -1:200000 IJ SOLN
30.0000 mL | Freq: Once | INTRAMUSCULAR | Status: DC
  Filled 2019-08-19: qty 30

## 2019-08-19 MED ORDER — SUGAMMADEX SODIUM 200 MG/2ML IV SOLN
INTRAVENOUS | Status: DC | PRN
  Administered 2019-08-19: 200 mg via INTRAVENOUS

## 2019-08-19 MED ORDER — ALBUTEROL SULFATE HFA 108 (90 BASE) MCG/ACT IN AERS
INHALATION_SPRAY | RESPIRATORY_TRACT | Status: DC | PRN
  Administered 2019-08-19 (×2): 2 via RESPIRATORY_TRACT

## 2019-08-19 MED ORDER — CEFAZOLIN SODIUM-DEXTROSE 2-4 GM/100ML-% IV SOLN
2.0000 g | INTRAVENOUS | Status: AC
  Administered 2019-08-19: 2 g via INTRAVENOUS
  Filled 2019-08-19: qty 100

## 2019-08-19 MED ORDER — METHOCARBAMOL 1000 MG/10ML IJ SOLN
500.0000 mg | Freq: Four times a day (QID) | INTRAVENOUS | Status: DC | PRN
  Filled 2019-08-19: qty 5

## 2019-08-19 MED ORDER — BUPIVACAINE LIPOSOME 1.3 % IJ SUSP
INTRAMUSCULAR | Status: DC | PRN
  Administered 2019-08-19: 10 mL

## 2019-08-19 MED ORDER — VITAMIN D 25 MCG (1000 UNIT) PO TABS
1000.0000 [IU] | ORAL_TABLET | Freq: Every day | ORAL | Status: DC
  Administered 2019-08-19 – 2019-08-20 (×2): 1000 [IU] via ORAL
  Filled 2019-08-19 (×2): qty 1

## 2019-08-19 MED ORDER — MIDAZOLAM HCL 2 MG/2ML IJ SOLN
INTRAMUSCULAR | Status: AC
  Filled 2019-08-19: qty 2

## 2019-08-19 MED ORDER — BISACODYL 10 MG RE SUPP: 10.0000 mg | Freq: Every day | RECTAL | Status: DC | PRN

## 2019-08-19 MED ORDER — TRIAMCINOLONE ACETONIDE 0.5 % EX OINT
1.0000 "application " | TOPICAL_OINTMENT | Freq: Two times a day (BID) | CUTANEOUS | Status: DC | PRN
  Filled 2019-08-19: qty 15

## 2019-08-19 MED ORDER — INSULIN ASPART 100 UNIT/ML ~~LOC~~ SOLN
0.0000 [IU] | Freq: Every day | SUBCUTANEOUS | Status: DC
  Administered 2019-08-19: 3 [IU] via SUBCUTANEOUS

## 2019-08-19 MED ORDER — ONDANSETRON HCL 4 MG PO TABS: 4.0000 mg | ORAL_TABLET | Freq: Four times a day (QID) | ORAL | Status: DC | PRN

## 2019-08-19 MED ORDER — BUPIVACAINE HCL (PF) 0.5 % IJ SOLN
INTRAMUSCULAR | Status: DC | PRN
  Administered 2019-08-19: 15 mL via PERINEURAL

## 2019-08-19 MED ORDER — DEXAMETHASONE SODIUM PHOSPHATE 10 MG/ML IJ SOLN
INTRAMUSCULAR | Status: DC | PRN
  Administered 2019-08-19: 8 mg via INTRAVENOUS

## 2019-08-19 MED ORDER — METOPROLOL TARTRATE 50 MG PO TABS: 100.0000 mg | ORAL_TABLET | Freq: Two times a day (BID) | ORAL | Status: DC

## 2019-08-19 MED ORDER — DOCUSATE SODIUM 100 MG PO CAPS
100.0000 mg | ORAL_CAPSULE | Freq: Two times a day (BID) | ORAL | Status: DC
  Administered 2019-08-19 – 2019-08-20 (×2): 100 mg via ORAL
  Filled 2019-08-19 (×2): qty 1

## 2019-08-19 MED ORDER — LORAZEPAM 1 MG PO TABS: 1.0000 mg | ORAL_TABLET | ORAL | Status: DC

## 2019-08-19 MED ORDER — FENTANYL CITRATE (PF) 100 MCG/2ML IJ SOLN
INTRAMUSCULAR | Status: AC
  Filled 2019-08-19: qty 2

## 2019-08-19 MED ORDER — SUMATRIPTAN SUCCINATE 50 MG PO TABS
100.0000 mg | ORAL_TABLET | ORAL | Status: DC | PRN
  Filled 2019-08-19: qty 2

## 2019-08-19 MED ORDER — CARBAMAZEPINE ER 100 MG PO CP12
100.0000 mg | ORAL_CAPSULE | Freq: Every day | ORAL | Status: DC
  Filled 2019-08-19: qty 1

## 2019-08-19 SURGICAL SUPPLY — 74 items
BAG ZIPLOCK 12X15 (MISCELLANEOUS) IMPLANT
BIT DRILL 1.6MX128 (BIT) IMPLANT
BIT DRILL 170X2.5X (BIT) IMPLANT
BIT DRL 170X2.5X (BIT) ×1
BLADE SAG 18X100X1.27 (BLADE) ×2 IMPLANT
CLSR STERI-STRIP ANTIMIC 1/2X4 (GAUZE/BANDAGES/DRESSINGS) ×1 IMPLANT
CONTROL EPI SZ1 (Orthopedic Implant) IMPLANT
COVER BACK TABLE 60X90IN (DRAPES) ×2 IMPLANT
COVER SURGICAL LIGHT HANDLE (MISCELLANEOUS) ×2 IMPLANT
COVER WAND RF STERILE (DRAPES) IMPLANT
CTR EPI SZ1 (Orthopedic Implant) ×2 IMPLANT
CUP D38 DXTEND STAND PLUS 6 HU (Orthopedic Implant) ×2 IMPLANT
CUP STD D38 DXTEND PLUS 6 HU (Orthopedic Implant) IMPLANT
DECANTER SPIKE VIAL GLASS SM (MISCELLANEOUS) ×2 IMPLANT
DRAPE INCISE IOBAN 66X45 STRL (DRAPES) ×2 IMPLANT
DRAPE ORTHO SPLIT 77X108 STRL (DRAPES) ×2
DRAPE SHEET LG 3/4 BI-LAMINATE (DRAPES) ×2 IMPLANT
DRAPE SURG ORHT 6 SPLT 77X108 (DRAPES) ×2 IMPLANT
DRAPE U-SHAPE 47X51 STRL (DRAPES) ×2 IMPLANT
DRILL 2.5 (BIT) ×1
DRSG ADAPTIC 3X8 NADH LF (GAUZE/BANDAGES/DRESSINGS) ×2 IMPLANT
DRSG PAD ABDOMINAL 8X10 ST (GAUZE/BANDAGES/DRESSINGS) ×2 IMPLANT
DURAPREP 26ML APPLICATOR (WOUND CARE) ×2 IMPLANT
ELECT BLADE TIP CTD 4 INCH (ELECTRODE) ×2 IMPLANT
ELECT NDL TIP 2.8 STRL (NEEDLE) ×1 IMPLANT
ELECT NEEDLE TIP 2.8 STRL (NEEDLE) ×2 IMPLANT
ELECT REM PT RETURN 15FT ADLT (MISCELLANEOUS) ×2 IMPLANT
GAUZE SPONGE 4X4 12PLY STRL (GAUZE/BANDAGES/DRESSINGS) ×2 IMPLANT
GLENOSPHERE XTEND RSA 38 SD +2 (Joint) ×1 IMPLANT
GLOVE BIOGEL PI ORTHO PRO 7.5 (GLOVE) ×1
GLOVE BIOGEL PI ORTHO PRO SZ8 (GLOVE) ×1
GLOVE ORTHO TXT STRL SZ7.5 (GLOVE) ×2 IMPLANT
GLOVE PI ORTHO PRO STRL 7.5 (GLOVE) ×1 IMPLANT
GLOVE PI ORTHO PRO STRL SZ8 (GLOVE) ×1 IMPLANT
GLOVE SURG ORTHO 8.5 STRL (GLOVE) ×2 IMPLANT
GOWN STRL REUS W/TWL XL LVL3 (GOWN DISPOSABLE) ×4 IMPLANT
KIT BASIN OR (CUSTOM PROCEDURE TRAY) ×2 IMPLANT
KIT TURNOVER KIT A (KITS) IMPLANT
MANIFOLD NEPTUNE II (INSTRUMENTS) ×2 IMPLANT
METAGLENE DELTA EXTEND (Trauma) IMPLANT
METAGLENE DXTEND (Trauma) ×2 IMPLANT
NDL MAYO CATGUT SZ4 TPR NDL (NEEDLE) IMPLANT
NEEDLE MAYO CATGUT SZ4 (NEEDLE) IMPLANT
NS IRRIG 1000ML POUR BTL (IV SOLUTION) ×2 IMPLANT
PACK SHOULDER (CUSTOM PROCEDURE TRAY) ×2 IMPLANT
PENCIL SMOKE EVACUATOR (MISCELLANEOUS) IMPLANT
PIN GUIDE 1.2 (PIN) ×1 IMPLANT
PIN GUIDE GLENOPHERE 1.5MX300M (PIN) ×1 IMPLANT
PIN METAGLENE 2.5 (PIN) ×1 IMPLANT
PROTECTOR NERVE ULNAR (MISCELLANEOUS) ×2 IMPLANT
RESTRAINT HEAD UNIVERSAL NS (MISCELLANEOUS) ×2 IMPLANT
SCREW 4.5X18MM (Screw) ×1 IMPLANT
SCREW BN 18X4.5XSTRL SHLDR (Screw) IMPLANT
SCREW LOCK 42 (Screw) ×1 IMPLANT
SCREW LOCK DELTA XTEND 4.5X30 (Screw) ×1 IMPLANT
SLING ARM FOAM STRAP LRG (SOFTGOODS) IMPLANT
SLING ARM FOAM STRAP MED (SOFTGOODS) ×1 IMPLANT
SMARTMIX MINI TOWER (MISCELLANEOUS)
SPONGE LAP 4X18 RFD (DISPOSABLE) IMPLANT
STEM HUMERAL SZ8 STANDARD (Stem) ×2 IMPLANT
STEM HUMERAL SZ8 STD (Stem) IMPLANT
STRIP CLOSURE SKIN 1/2X4 (GAUZE/BANDAGES/DRESSINGS) ×2 IMPLANT
SUCTION FRAZIER HANDLE 10FR (MISCELLANEOUS) ×1
SUCTION TUBE FRAZIER 10FR DISP (MISCELLANEOUS) ×1 IMPLANT
SUT FIBERWIRE #2 38 T-5 BLUE (SUTURE) ×4
SUT MNCRL AB 4-0 PS2 18 (SUTURE) ×2 IMPLANT
SUT VIC AB 0 CT1 36 (SUTURE) ×4 IMPLANT
SUT VIC AB 0 CT2 27 (SUTURE) ×2 IMPLANT
SUT VIC AB 2-0 CT1 27 (SUTURE) ×1
SUT VIC AB 2-0 CT1 TAPERPNT 27 (SUTURE) ×1 IMPLANT
SUTURE FIBERWR #2 38 T-5 BLUE (SUTURE) ×2 IMPLANT
TOWEL OR 17X26 10 PK STRL BLUE (TOWEL DISPOSABLE) ×2 IMPLANT
TOWER SMARTMIX MINI (MISCELLANEOUS) IMPLANT
YANKAUER SUCT BULB TIP 10FT TU (MISCELLANEOUS) ×2 IMPLANT

## 2019-08-19 NOTE — Anesthesia Procedure Notes (Signed)
Procedure Name: Intubation Date/Time: 08/19/2019 7:43 AM Performed by: Gerald Leitz, CRNA Pre-anesthesia Checklist: Patient identified, Emergency Drugs available, Suction available and Patient being monitored Patient Re-evaluated:Patient Re-evaluated prior to induction Oxygen Delivery Method: Circle system utilized Preoxygenation: Pre-oxygenation with 100% oxygen Induction Type: IV induction Ventilation: Mask ventilation without difficulty Laryngoscope Size: Mac and 3 Grade View: Grade I Tube type: Oral Tube size: 7.0 mm Number of attempts: 1 Placement Confirmation: ETT inserted through vocal cords under direct vision,  positive ETCO2 and breath sounds checked- equal and bilateral Secured at: 23 cm Tube secured with: Tape Dental Injury: Teeth and Oropharynx as per pre-operative assessment  Comments: Intubated by C. Devenger, SRNA

## 2019-08-19 NOTE — Discharge Instructions (Signed)
Ice to the shoulder constantly.  Keep the incision covered and clean and dry for one week, then ok to get it wet in the shower. ° °Do exercise as instructed several times per day. ° °DO NOT reach behind your back or push up out of a chair with the operative arm. ° °Use a sling while you are up and around for comfort, may remove while seated.  Keep pillow propped behind the operative elbow. ° °Follow up with Dr Camrin Gearheart in two weeks in the office, call [phone] for appt °

## 2019-08-19 NOTE — Op Note (Signed)
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  PROCEDURE PERFORMED:  Right reverse total shoulder arthroplasty using DePuy Delta Xtend prosthesis with no subscap repair.  ATTENDING SURGEON:  Esmond Plants, MD   ASSISTANT:  Darol Destine, [place], who was scrubbed during the entire procedure and necessary for satisfactory completion of surgery.  ANESTHESIA:  General anesthesia was used plus interscalene block.  ESTIMATED BLOOD LOSS:  Less than 100 mL.  FLUID REPLACEMENT:  1000 mL crystalloid.  INSTRUMENT COUNTS:  Correct.  COMPLICATIONS:  No complications.  ANTIBIOTICS:  Perioperative antibiotics were given.  INDICATIONS:  The patient is a 74 year old female who has a history of multiple right shoulder surgeries and right shoulder fracture.  The patient has had progressive pain despite conservative management over a period of years.  She presents now with  debilitating pain and poor function of the right shoulder and no ability to really elevate that arm in space and a loss of fixed focal mechanics.  We discussed options for management to include pain management and continued conservative management,  basically observation versus reverse shoulder replacement.  The patient elected to proceed with shoulder surgery to restore function and eliminate pain to her shoulder.  Informed consent obtained.  DESCRIPTION OF PROCEDURE:  After an adequate level of anesthesia was achieved, the patient was positioned in modified beach chair position.  Right shoulder correctly identified and sterilely prepped and draped in the usual manner.  Time-out  called,  verifying correct patient, correct site.  We entered the shoulder using standard deltopectoral approach, starting at the coracoid process, extending down to the anterior humerus.  Dissection down through subcutaneous tissues using the Bovie dissection.   We identified the cephalic vein, took that laterally with the deltoid pectoralis taken medially.  Conjoined tendon identified and retracted medially.  Deep retractors placed.  We had to do quite a bit of scar release in the subdeltoid plane.  We were  able to get enough exposure to release the remnant of the subscap and the inferior capsule and tagged that for retraction and protection of the axillary nerve.  Once we could extend the shoulder and get the humerus out of the wound we were able to place  our 6 mm reamer and then reamed up to a size 8 mm.  At this point, we used our 8 mm guide and resected our head at 10 degrees of retroversion for this Delta reverse shoulder.  We used a rongeur to remove excess osteophytes and a combination of finger  dissection and Cobb elevator to remove additional scar tissue posteriorly.  The patient's greater tuberosity had fractured and basically healed to the back of the humeral shaft posteriorly.  Some of this was sacrificed as a part of the freeing up and  dissection of the shoulder.  We retained some of the posterior rotator cuff hopefully to get better function.  Once we had the humerus preparation complete, we subluxed the humerus posteriorly gaining good exposure of the glenoid face.  We did a 360  degree exposure of that, removing the capsule and labrum, placing our retractors and then placing our guide pin for the reaming for the metaglene baseplate.  We then reamed for that getting to good bony bleeding  and then did our peripheral hand reaming,  drilled out our central peg hole, and then impacted the metaglene into position keying off at 6 o'clock, inferior scapular neck  position.  We placed a 42 screw  inferiorly, a 30 at the base of the coracoid, and then an 18 nonlocked posteriorly.  We could  not get an anterior screw due to really no bone there to place a screw in but we had good baseplate support and placed a 38+2 standard glenosphere into position and screwed that onto the baseplate.  Did a finger sweep to make sure that everything was  free and clear.  No soft tissue incarcerated in that bearing.  We then went ahead and directed our attention back toward the humeral side.  We reamed for the size 1 centered metaphysis and then placed our trial 8 stem 1 centered in 10 degrees of  retroversion and impacted that into place and then selected a 38+3 poly trial.  We felt like we could probably get the 38+6 in for the real.  We removed the trial components, irrigated thoroughly, used available bone graft, and a press-fit technique with  a pore coat 8 stem with HA coated 1 centered metaphysis set on the 0 setting and placed in 10 degrees of retroversion.  We impacted that in position.  We had great stem security and then selected a 38+6 poly, impacted down the humeral tray and then  reduced the shoulder.  We were pleased with our soft tissue balancing.  We had appropriate tensioning of the conjoined tendon.  Axillary nerve was under appropriate tension as well.  No gapping with full passive motion.  We irrigated thoroughly, closed  deltopectoral interval with 0 Vicryl suture followed by 2-0 Vicryl for subcutaneous closure and 4-0 Monocryl for skin.  Steri-Strips applied followed by sterile dressing.  The patient tolerated surgery well.  CN/NUANCE  D:08/19/2019 T:08/19/2019 JOB:[id number]

## 2019-08-19 NOTE — Interval H&P Note (Signed)
History and Physical Interval Note:  08/19/2019 7:30 AM  Amber Stephenson  has presented today for surgery, with the diagnosis of Right shoulder osteoarthritis.  The various methods of treatment have been discussed with the patient and family. After consideration of risks, benefits and other options for treatment, the patient has consented to  Procedure(s) with comments: REVERSE SHOULDER ARTHROPLASTY (Right) - interscalene block as a surgical intervention.  The patient's history has been reviewed, patient examined, no change in status, stable for surgery.  I have reviewed the patient's chart and labs.  Questions were answered to the patient's satisfaction.     Augustin Schooling

## 2019-08-19 NOTE — Transfer of Care (Signed)
Immediate Anesthesia Transfer of Care Note  Patient: Amber Stephenson  Procedure(s) Performed: Procedure(s) with comments: REVERSE SHOULDER ARTHROPLASTY (Right) - interscalene block  Patient Location: PACU  Anesthesia Type:General  Level of Consciousness: Alert, Awake, Oriented  Airway & Oxygen Therapy: Patient Spontanous Breathing  Post-op Assessment: Report given to RN  Post vital signs: Reviewed and stable  Last Vitals:  Vitals:   08/19/19 0603  BP: 129/81  Pulse: 61  Resp: 13  Temp: 36.7 C  SpO2: 0000000    Complications: No apparent anesthesia complications

## 2019-08-19 NOTE — Anesthesia Postprocedure Evaluation (Signed)
Anesthesia Post Note  Patient: Amber Stephenson  Procedure(s) Performed: REVERSE SHOULDER ARTHROPLASTY (Right Shoulder)     Patient location during evaluation: PACU Anesthesia Type: General Level of consciousness: awake and alert Pain management: pain level controlled Vital Signs Assessment: post-procedure vital signs reviewed and stable Respiratory status: spontaneous breathing, nonlabored ventilation, respiratory function stable and patient connected to nasal cannula oxygen Cardiovascular status: blood pressure returned to baseline and stable Postop Assessment: no apparent nausea or vomiting Anesthetic complications: no    Last Vitals:  Vitals:   08/19/19 1158 08/19/19 1352  BP: 136/69 (!) 141/86  Pulse: (!) 57 64  Resp: 14 20  Temp: (!) 36.4 C 36.7 C  SpO2: 94%     Last Pain:  Vitals:   08/19/19 1158  TempSrc: Oral  PainSc:                  Lidia Collum

## 2019-08-19 NOTE — Anesthesia Procedure Notes (Signed)
Anesthesia Regional Block: Interscalene brachial plexus block   Pre-Anesthetic Checklist: ,, timeout performed, Correct Patient, Correct Site, Correct Laterality, Correct Procedure, Correct Position, site marked, Risks and benefits discussed,  Surgical consent,  Pre-op evaluation,  At surgeon's request and post-op pain management  Laterality: Right  Prep: chloraprep       Needles:  Injection technique: Single-shot  Needle Type: Echogenic Stimulator Needle     Needle Length: 10cm  Needle Gauge: 21     Additional Needles:   Procedures:,,,, ultrasound used (permanent image in chart),,,,  Narrative:  Start time: 08/19/2019 7:05 AM End time: 08/19/2019 7:09 AM Injection made incrementally with aspirations every 5 mL.  Performed by: Personally  Anesthesiologist: Lidia Collum, MD  Additional Notes: Monitors applied. Injection made in 5cc increments. No resistance to injection. Good needle visualization. Patient tolerated procedure well.

## 2019-08-19 NOTE — Plan of Care (Signed)

## 2019-08-19 NOTE — Brief Op Note (Signed)
08/19/2019  9:26 AM  PATIENT:  Amber Stephenson  74 y.o. female  PRE-OPERATIVE DIAGNOSIS:  Right shoulder osteoarthritis, end stage, rotator cuff insufficiency  POST-OPERATIVE DIAGNOSIS:  same  PROCEDURE:  Procedure(s) with comments: REVERSE SHOULDER ARTHROPLASTY (Right) - interscalene block DePuy Delta Xtend  SURGEON:  Surgeon(s) and Role:    Netta Cedars, MD - Primary  PHYSICIAN ASSISTANT:   ASSISTANTS: [place] Bruns, PA-C   ANESTHESIA:   regional and general  EBL:  100 cc   BLOOD ADMINISTERED:none  DRAINS: none   LOCAL MEDICATIONS USED:  MARCAINE     SPECIMEN:  No Specimen  DISPOSITION OF SPECIMEN:  N/A  COUNTS:  YES  TOURNIQUET:  * No tourniquets in log *  DICTATION: .Other Dictation: Dictation Number [id number]  PLAN OF CARE: Admit to inpatient   PATIENT DISPOSITION:  PACU - hemodynamically stable.   Delay start of Pharmacological VTE agent (>24hrs) due to surgical blood loss or risk of bleeding: not applicable

## 2019-08-20 DIAGNOSIS — I5032 Chronic diastolic (congestive) heart failure: Secondary | ICD-10-CM | POA: Diagnosis not present

## 2019-08-20 DIAGNOSIS — I2510 Atherosclerotic heart disease of native coronary artery without angina pectoris: Secondary | ICD-10-CM | POA: Diagnosis not present

## 2019-08-20 DIAGNOSIS — E1140 Type 2 diabetes mellitus with diabetic neuropathy, unspecified: Secondary | ICD-10-CM | POA: Diagnosis not present

## 2019-08-20 DIAGNOSIS — G4733 Obstructive sleep apnea (adult) (pediatric): Secondary | ICD-10-CM | POA: Diagnosis not present

## 2019-08-20 DIAGNOSIS — M19011 Primary osteoarthritis, right shoulder: Secondary | ICD-10-CM | POA: Diagnosis not present

## 2019-08-20 DIAGNOSIS — I110 Hypertensive heart disease with heart failure: Secondary | ICD-10-CM | POA: Diagnosis not present

## 2019-08-20 LAB — BASIC METABOLIC PANEL
Anion gap: 11 (ref 5–15)
BUN: 20 mg/dL (ref 8–23)
CO2: 23 mmol/L (ref 22–32)
Calcium: 8.7 mg/dL — ABNORMAL LOW (ref 8.9–10.3)
Chloride: 108 mmol/L (ref 98–111)
Creatinine, Ser: 1.06 mg/dL — ABNORMAL HIGH (ref 0.44–1.00)
GFR calc Af Amer: >60 mL/min (ref >60)
GFR calc non Af Amer: 52 mL/min — ABNORMAL LOW (ref >60)
Glucose, Bld: 269 mg/dL — ABNORMAL HIGH (ref 70–99)
Potassium: 4.1 mmol/L (ref 3.5–5.1)
Sodium: 142 mmol/L (ref 135–145)

## 2019-08-20 LAB — HEMOGLOBIN AND HEMATOCRIT, BLOOD
HCT: 38.9 % (ref 36.0–46.0)
Hemoglobin: 12.3 g/dL (ref 12.0–15.0)

## 2019-08-20 LAB — GLUCOSE, CAPILLARY
Glucose-Capillary: 156 mg/dL — ABNORMAL HIGH (ref 70–99)
Glucose-Capillary: 112 mg/dL — ABNORMAL HIGH (ref 70–99)

## 2019-08-20 NOTE — Progress Notes (Signed)
    Subjective:  Patient reports pain as mild to moderate.  Denies N/V/CP/SOB.   Objective:   VITALS:   Vitals:   08/19/19 1957 08/19/19 2047 08/20/19 0153 08/20/19 0640  BP:  131/64 110/67 122/63  Pulse:  78 70 (!) 58  Resp:  16 20 20   Temp:  97.8 F (36.6 C) 97.8 F (36.6 C) 98 F (36.7 C)  TempSrc:  Oral Oral Oral  SpO2: 94% 96% 95% 98%  Weight:      Height:        NAD ABD soft Sling intact Dressing changed: incis c/d/i, aquacell placed Motor: (+) AIN, PIN, U 2+ radial Altered sensation due to block  Lab Results  Component Value Date   WBC 9.0 08/12/2019   HGB 12.3 08/20/2019   HCT 38.9 08/20/2019   MCV 93.8 08/12/2019   PLT 182 08/12/2019   BMET    Component Value Date/Time   NA 142 08/20/2019 0225   NA 141 05/20/2019 0918   K 4.1 08/20/2019 0225   CL 108 08/20/2019 0225   CO2 23 08/20/2019 0225   GLUCOSE 269 (H) 08/20/2019 0225   BUN 20 08/20/2019 0225   BUN 17 05/20/2019 0918   CREATININE 1.06 (H) 08/20/2019 0225   CALCIUM 8.7 (L) 08/20/2019 0225   GFRNONAA 52 (L) 08/20/2019 0225   GFRAA >60 08/20/2019 0225     Assessment/Plan: 1 Day Post-Op   Active Problems:   S/P shoulder replacement, right   NWB RUE, sling PO pain control PT/OT Dispo: d/c home after clears OT   Hilton Cork Nohlan Burdin 08/20/2019, 9:26 AM   Rod Can, MD [phone] [facility]  [place] [address], [address], [place], [place] [address] Phone: [phone] [email or web address] Facebook  [employer]

## 2019-08-20 NOTE — Care Management Obs Status (Signed)
[place] NOTIFICATION   Patient Details  Name: Amber Stephenson MRN: [id number] Date of Birth: [birth year]   Medicare Observation Status Notification Given:  Yes    Joaquin Courts, RN 08/20/2019, 10:58 AM

## 2019-08-20 NOTE — Plan of Care (Signed)
Adequate for discharge.

## 2019-08-20 NOTE — Care Management CC44 (Signed)
Condition Code 44 Documentation Completed  Patient Details  Name: Amber Stephenson MRN: [id number] Date of Birth: 10/11/1945   Condition Code 44 given:  Yes Patient signature on Condition Code 44 notice:  Yes Documentation of 2 MD's agreement:  Yes Code 44 added to claim:  Yes    Joaquin Courts, RN 08/20/2019, 10:58 AM

## 2019-08-20 NOTE — Progress Notes (Signed)
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   Clinical Impression   Completed all education regarding compensatory strategies and management of R UE per protocol Written information provided and reviewed. Pt and spouse verbalized and demonstrated understanding. Pt to follow upo with Dr. Veverly Fells for further therapy needs.     Follow Up Recommendations  Follow surgeon's recommendation for DC plan and follow-up therapies;Supervision/Assistance - 24 hour    Equipment Recommendations  None recommended by OT    Recommendations for Other Services       Precautions / Restrictions Precautions Precautions: Shoulder Type of Shoulder Precautions: no ER, AROM for self-care tasks only SP 90, ABD 60, ER 30 degrees respectively Shoulder Interventions: Shoulder sling/immobilizer;At all times;Off for dressing/bathing/exercises;For comfort Precaution Booklet Issued: Yes (comment)(handout) Required Braces or Orthoses: Sling Restrictions Weight Bearing Restrictions: Yes RUE Weight Bearing: Non weight bearing      Mobility Bed Mobility Overal bed mobility: Needs Assistance Bed Mobility: Supine to Sit     Supine to sit: Min assist        Transfers Overall transfer level: Needs assistance   Transfers: Sit to/from Stand Sit to Stand: Min guard              Balance Overall balance assessment: Mild deficits observed, not formally tested                                         ADL either performed or assessed with clinical judgement   ADL Overall ADL's : Needs assistance/impaired Eating/Feeding: Set up   Grooming: Minimal assistance   Upper Body Bathing: Minimal assistance    Lower Body Bathing: Moderate assistance   Upper Body Dressing : Minimal assistance   Lower Body Dressing: Moderate assistance   Toilet Transfer: Supervision/safety   Toileting- Clothing Manipulation and Hygiene: Minimal assistance   Tub/ Shower Transfer: Minimal assistance   Functional mobility during ADLs: Minimal assistance       Vision Baseline Vision/History: Wears glasses Wears Glasses: At all times       Perception     Praxis      Pertinent Vitals/Pain Pain Assessment: 0-10 Pain Score: 4  Pain Location: R shoulder  Pain Descriptors / Indicators: Grimacing;Discomfort Pain Intervention(s): Repositioned;Limited activity within patient's tolerance     Hand Dominance Right   Extremity/Trunk Assessment Upper Extremity Assessment Upper Extremity Assessment: RUE deficits/detail RUE: Unable to fully assess due to immobilization   Lower Extremity Assessment Lower Extremity Assessment: Overall WFL for tasks assessed       Communication Communication Communication: No difficulties   Cognition Arousal/Alertness: Awake/alert Behavior During Therapy: WFL for tasks assessed/performed Overall Cognitive Status: Within Functional Limits for tasks assessed                                     General Comments       Exercises Exercises: Shoulder;Hand exercises Shoulder Exercises Elbow Flexion: AAROM Elbow Extension: AAROM Wrist Flexion: AROM;10 reps Wrist Extension: AROM;10 reps Digit Composite Flexion: AROM;10 reps Composite Extension: AROM;10 reps Hand Exercises Forearm Supination: AAROM Forearm Pronation: AAROM   Shoulder Instructions Shoulder  Instructions Donning/doffing shirt without moving shoulder: Minimal assistance Method for sponge bathing under operated UE: Minimal assistance Donning/doffing sling/immobilizer: Minimal assistance Correct positioning of sling/immobilizer: Minimal assistance ROM for elbow, wrist and digits of operated  UE: Minimal assistance Sling wearing schedule (on at all times/off for ADL's): Minimal assistance Proper positioning of operated UE when showering: Minimal assistance Positioning of UE while sleeping: Minimal assistance    Home Living Family/patient expects to be discharged to:: Private residence Living Arrangements: Spouse/significant other Available Help at Discharge: Family;Available 24 hours/day Type of Home: House Home Access: Stairs to enter [employer] of Steps: 5 Entrance Stairs-Rails: Left Home Layout: One level     Bathroom Shower/Tub: Occupational psychologist: Standard     Home Equipment: Shower seat - built in;Cane - single point;Grab bars - toilet;Grab bars - tub/shower          Prior Functioning/Environment Level of Independence: Independent                 OT Problem List:        OT Treatment/Interventions:      OT Goals(Current goals can be found in the care plan section)    OT Frequency:     Barriers to D/C:            Co-evaluation              AM-PAC OT "6 Clicks" Daily Activity     Outcome Measure Help from another person eating meals?: A Little Help from another person taking care of personal grooming?: A Little Help from another person toileting, which includes using toliet, bedpan, or urinal?: A Little Help from another person bathing (including washing, rinsing, drying)?: A Lot Help from another person to put on and taking off regular upper body clothing?: A Little Help from another person to put on and taking off regular lower body clothing?: A Lot 6 Click Score: 16   End of Session Nurse Communication: Mobility status  Activity Tolerance: Patient tolerated treatment well Patient left: in chair;with call bell/phone within reach;with family/visitor present                   Time: 1010-1050 OT Time Calculation (min): 40 min Charges:  OT General Charges $OT Visit: 1 Visit OT Evaluation $OT Eval Low  Complexity: 1 Low OT Treatments $Self Care/Home Management : 8-22 mins $Therapeutic Exercise: 8-22 mins  Lalanya Rufener OTR/L   Josepha Barbier 08/20/2019, 11:12 AM

## 2019-08-20 NOTE — Progress Notes (Signed)
Pt stable with no needs. Pt had no questions or concerns at time of discharge instructions and education. Pt dressing dry and intact.

## 2019-08-22 ENCOUNTER — Emergency Department (HOSPITAL_COMMUNITY): Payer: Medicare Other

## 2019-08-22 ENCOUNTER — Telehealth: Payer: Self-pay

## 2019-08-22 ENCOUNTER — Encounter: Payer: Self-pay | Admitting: Physical Medicine and Rehabilitation

## 2019-08-22 ENCOUNTER — Other Ambulatory Visit: Payer: Self-pay

## 2019-08-22 ENCOUNTER — Observation Stay (HOSPITAL_COMMUNITY)
Admission: EM | Admit: 2019-08-22 | Discharge: 2019-08-23 | Disposition: A | Discharge status: Home / Self Care | Payer: Medicare Other | Attending: Family Medicine | Admitting: Family Medicine

## 2019-08-22 ENCOUNTER — Encounter (HOSPITAL_COMMUNITY): Payer: Self-pay | Admitting: Emergency Medicine

## 2019-08-22 ENCOUNTER — Encounter
Payer: Medicare Other | Attending: Physical Medicine and Rehabilitation | Admitting: Physical Medicine and Rehabilitation

## 2019-08-22 VITALS — BP 98/66 | HR 63 | Temp 97.7°F | Ht 62.0 in | Wt 197.8 lb

## 2019-08-22 DIAGNOSIS — J4520 Mild intermittent asthma, uncomplicated: Secondary | ICD-10-CM | POA: Diagnosis present

## 2019-08-22 DIAGNOSIS — M797 Fibromyalgia: Secondary | ICD-10-CM | POA: Diagnosis not present

## 2019-08-22 DIAGNOSIS — E119 Type 2 diabetes mellitus without complications: Secondary | ICD-10-CM | POA: Insufficient documentation

## 2019-08-22 DIAGNOSIS — R0602 Shortness of breath: Secondary | ICD-10-CM | POA: Diagnosis not present

## 2019-08-22 DIAGNOSIS — I5032 Chronic diastolic (congestive) heart failure: Secondary | ICD-10-CM | POA: Diagnosis not present

## 2019-08-22 DIAGNOSIS — I2510 Atherosclerotic heart disease of native coronary artery without angina pectoris: Secondary | ICD-10-CM | POA: Diagnosis present

## 2019-08-22 DIAGNOSIS — I4891 Unspecified atrial fibrillation: Secondary | ICD-10-CM | POA: Insufficient documentation

## 2019-08-22 DIAGNOSIS — E669 Obesity, unspecified: Secondary | ICD-10-CM | POA: Insufficient documentation

## 2019-08-22 DIAGNOSIS — Z79899 Other long term (current) drug therapy: Secondary | ICD-10-CM | POA: Insufficient documentation

## 2019-08-22 DIAGNOSIS — R0600 Dyspnea, unspecified: Secondary | ICD-10-CM | POA: Diagnosis not present

## 2019-08-22 DIAGNOSIS — R05 Cough: Secondary | ICD-10-CM | POA: Diagnosis not present

## 2019-08-22 DIAGNOSIS — R0902 Hypoxemia: Secondary | ICD-10-CM

## 2019-08-22 DIAGNOSIS — E1165 Type 2 diabetes mellitus with hyperglycemia: Secondary | ICD-10-CM

## 2019-08-22 DIAGNOSIS — R069 Unspecified abnormalities of breathing: Secondary | ICD-10-CM | POA: Diagnosis not present

## 2019-08-22 DIAGNOSIS — G8929 Other chronic pain: Secondary | ICD-10-CM | POA: Diagnosis present

## 2019-08-22 DIAGNOSIS — I110 Hypertensive heart disease with heart failure: Secondary | ICD-10-CM | POA: Diagnosis not present

## 2019-08-22 DIAGNOSIS — G4733 Obstructive sleep apnea (adult) (pediatric): Secondary | ICD-10-CM | POA: Diagnosis not present

## 2019-08-22 DIAGNOSIS — Z794 Long term (current) use of insulin: Secondary | ICD-10-CM | POA: Insufficient documentation

## 2019-08-22 DIAGNOSIS — E785 Hyperlipidemia, unspecified: Secondary | ICD-10-CM | POA: Diagnosis not present

## 2019-08-22 DIAGNOSIS — M7918 Myalgia, other site: Secondary | ICD-10-CM | POA: Insufficient documentation

## 2019-08-22 DIAGNOSIS — M792 Neuralgia and neuritis, unspecified: Secondary | ICD-10-CM | POA: Diagnosis not present

## 2019-08-22 DIAGNOSIS — Z6834 Body mass index (BMI) 34.0-34.9, adult: Secondary | ICD-10-CM | POA: Diagnosis not present

## 2019-08-22 DIAGNOSIS — G894 Chronic pain syndrome: Secondary | ICD-10-CM | POA: Diagnosis not present

## 2019-08-22 DIAGNOSIS — Z20822 Contact with and (suspected) exposure to covid-19: Secondary | ICD-10-CM | POA: Diagnosis not present

## 2019-08-22 DIAGNOSIS — R0603 Acute respiratory distress: Secondary | ICD-10-CM

## 2019-08-22 DIAGNOSIS — F319 Bipolar disorder, unspecified: Secondary | ICD-10-CM | POA: Diagnosis present

## 2019-08-22 DIAGNOSIS — I10 Essential (primary) hypertension: Secondary | ICD-10-CM | POA: Diagnosis present

## 2019-08-22 DIAGNOSIS — M549 Dorsalgia, unspecified: Secondary | ICD-10-CM | POA: Diagnosis not present

## 2019-08-22 DIAGNOSIS — R0689 Other abnormalities of breathing: Secondary | ICD-10-CM | POA: Diagnosis not present

## 2019-08-22 DIAGNOSIS — I482 Chronic atrial fibrillation, unspecified: Secondary | ICD-10-CM | POA: Diagnosis present

## 2019-08-22 HISTORY — DX: Myalgia, other site: M79.18

## 2019-08-22 HISTORY — DX: Chronic pain syndrome: G89.4

## 2019-08-22 LAB — CBC WITH DIFFERENTIAL/PLATELET
Abs Immature Granulocytes: 0.19 10*3/uL — ABNORMAL HIGH (ref 0.00–0.07)
Basophils Absolute: 0.1 10*3/uL (ref 0.0–0.1)
Basophils Relative: 1 %
Eosinophils Absolute: 0.3 10*3/uL (ref 0.0–0.5)
Eosinophils Relative: 2 %
HCT: 38.5 % (ref 36.0–46.0)
Hemoglobin: 12.3 g/dL (ref 12.0–15.0)
Immature Granulocytes: 2 %
Lymphocytes Relative: 27 %
Lymphs Abs: 3.5 10*3/uL (ref 0.7–4.0)
MCH: 30.1 pg (ref 26.0–34.0)
MCHC: 31.9 g/dL (ref 30.0–36.0)
MCV: 94.4 fL (ref 80.0–100.0)
Monocytes Absolute: 0.6 10*3/uL (ref 0.1–1.0)
Monocytes Relative: 5 %
Neutro Abs: 8.3 10*3/uL — ABNORMAL HIGH (ref 1.7–7.7)
Neutrophils Relative %: 63 %
Platelets: 187 10*3/uL (ref 150–400)
RBC: 4.08 MIL/uL (ref 3.87–5.11)
RDW: 13.8 % (ref 11.5–15.5)
WBC: 13 10*3/uL — ABNORMAL HIGH (ref 4.0–10.5)
nRBC: 0 % (ref 0.0–0.2)

## 2019-08-22 LAB — D-DIMER, QUANTITATIVE: D-Dimer, Quant: 1.03 ug/mL-FEU — ABNORMAL HIGH (ref 0.00–0.50)

## 2019-08-22 LAB — COMPREHENSIVE METABOLIC PANEL
ALT: 17 U/L (ref 0–44)
AST: 24 U/L (ref 15–41)
Albumin: 3.6 g/dL (ref 3.5–5.0)
Alkaline Phosphatase: 109 U/L (ref 38–126)
Anion gap: 6 (ref 5–15)
BUN: 22 mg/dL (ref 8–23)
CO2: 24 mmol/L (ref 22–32)
Calcium: 8.4 mg/dL — ABNORMAL LOW (ref 8.9–10.3)
Chloride: 108 mmol/L (ref 98–111)
Creatinine, Ser: 0.82 mg/dL (ref 0.44–1.00)
GFR calc Af Amer: >60 mL/min (ref >60)
GFR calc non Af Amer: >60 mL/min (ref >60)
Glucose, Bld: 200 mg/dL — ABNORMAL HIGH (ref 70–99)
Potassium: 4.5 mmol/L (ref 3.5–5.1)
Sodium: 138 mmol/L (ref 135–145)
Total Bilirubin: 0.9 mg/dL (ref 0.3–1.2)
Total Protein: 7 g/dL (ref 6.5–8.1)

## 2019-08-22 LAB — BRAIN NATRIURETIC PEPTIDE: B Natriuretic Peptide: 151 pg/mL — ABNORMAL HIGH (ref 0.0–100.0)

## 2019-08-22 LAB — PROTIME-INR
INR: 1.3 — ABNORMAL HIGH (ref 0.8–1.2)
Prothrombin Time: 15.9 seconds — ABNORMAL HIGH (ref 11.4–15.2)

## 2019-08-22 LAB — TROPONIN I (HIGH SENSITIVITY)
Troponin I (High Sensitivity): 7 ng/L (ref <18)
Troponin I (High Sensitivity): 8 ng/L (ref <18)

## 2019-08-22 LAB — APTT: aPTT: 32 seconds (ref 24–36)

## 2019-08-22 MED ORDER — ACETAMINOPHEN 325 MG PO TABS
650.0000 mg | ORAL_TABLET | Freq: Four times a day (QID) | ORAL | Status: DC | PRN
  Administered 2019-08-23: 650 mg via ORAL
  Filled 2019-08-22: qty 2

## 2019-08-22 MED ORDER — ONDANSETRON HCL 4 MG/2ML IJ SOLN: 4.0000 mg | Freq: Four times a day (QID) | INTRAMUSCULAR | Status: DC | PRN

## 2019-08-22 MED ORDER — PREGABALIN 200 MG PO CAPS: 200.0000 mg | ORAL_CAPSULE | Freq: Two times a day (BID) | ORAL | 3 refills | Status: DC

## 2019-08-22 MED ORDER — ACETAMINOPHEN 650 MG RE SUPP: 650.0000 mg | Freq: Four times a day (QID) | RECTAL | Status: DC | PRN

## 2019-08-22 MED ORDER — ONDANSETRON HCL 4 MG PO TABS: 4.0000 mg | ORAL_TABLET | Freq: Four times a day (QID) | ORAL | Status: DC | PRN

## 2019-08-22 MED ORDER — METHOCARBAMOL 500 MG PO TABS: 500.0000 mg | ORAL_TABLET | Freq: Three times a day (TID) | ORAL | 3 refills | Status: DC | PRN

## 2019-08-22 NOTE — Telephone Encounter (Signed)
I will defer to Dr.Koneswaran on this.

## 2019-08-22 NOTE — Telephone Encounter (Signed)
Pt states she had recent shoulder surgery and is unable to keep taking e taking 40mg  furosemide. She can't wipe that often.  Please call [phone]  Thanks renee

## 2019-08-22 NOTE — Progress Notes (Signed)
Subjective:    Patient ID: Amber Stephenson, female    DOB: [birth year], 74 y.o.   MRN: [id number]  HPI  Patient is a 74 yr old with Afib, CHF, CAD, and DM- with CKD- Stage III here for right low back pain with sciatica here for evaluation.    Uses a TENS unit- helps a lot.   Also on Percocet 5/325 mg usually takes 4x/day - not every time- takes as needed- some days are worse than others.    Hasn't had Epidural injections but has had trigger point injections helpful.  Only lasted 3-4 days. Was done by anesthesia- but needle moved around some.   Did accupuncture in [place]- worked real well.  Lasted weeks (between sessions)   On Lyrica 150 mg 2x/day- has never tried a higher dose.  Doesn't go down the leg- just stays in back, but when it hurts, it really hurts.   Heat works really well, but only lasts a short period.  Also has neck pain- might trigger migraines.   Has tried muscle relaxants- hasn't gotten much in way of muscle relaxants.  Received Robaxin last week for shoulder- worked great- was bummed didn't get to go home.    Had a R shoulder replacement- Last Friday-  Still feels numb- right around the shoulder To be in sling until 4/1- at least 2 weeks.    Pain Inventory Average Pain 9 Pain Right Now 8 My pain is constant and tingling  In the last 24 hours, has pain interfered with the following? General activity 7 Relation with others 7 Enjoyment of life 7 What TIME of day is your pain at its worst? all Sleep (in general) Poor  Pain is worse with: bending and sitting Pain improves with: heat/ice and TENS Relief from Meds: 5  Mobility how many minutes can you walk? 5 ability to climb steps?  no do you drive?  no  Function retired I need assistance with the following:  dressing, bathing and household duties  Neuro/Psych tingling spasms  Prior Studies Any changes since last visit?  yes x-rays CT/MRI  Physicians involved in your care Any changes  since last visit?  yes Orthopedist Dr. Talitha Givens replacement   Family History  Problem Relation Age of Onset  . Diabetes Mother   . Heart disease Mother   . Heart disease Brother   . Heart disease Sister        CABG  . Alcohol abuse Sister   . Mental illness Brother   . Diabetes Brother    Social History   Socioeconomic History  . Marital status: Married    Spouse name: Orpah Greek  . Number of children: 3  . Years of education: 3  . Highest education level: Associate degree: academic program  Occupational History  . Occupation: Retired  Tobacco Use  . Smoking status: Never Smoker  . Smokeless tobacco: Never Used  Substance and Sexual Activity  . Alcohol use: No    Alcohol/week: 0.0 standard drinks  . Drug use: No  . Sexual activity: Yes    Partners: Male    Birth control/protection: Post-menopausal  Other Topics Concern  . Not on file  Social History Narrative   Lives at home with husband.       Right handed   Social Determinants of Health   Financial Resource Strain:   . Difficulty of Paying Living Expenses:   Food Insecurity:   . Worried About Charity fundraiser in the Last Year:   .  Ran Out of Food in the Last Year:   Transportation Needs:   . Film/video editor (Medical):   Marland Kitchen Lack of Transportation (Non-Medical):   Physical Activity:   . Days of Exercise per Week:   . Minutes of Exercise per Session:   Stress:   . Feeling of Stress :   Social Connections:   . Frequency of Communication with Friends and Family:   . Frequency of Social Gatherings with Friends and Family:   . Attends Religious Services:   . Active Member of Clubs or Organizations:   . Attends Archivist Meetings:   Marland Kitchen Marital Status:    Past Surgical History:  Procedure Laterality Date  . BREAST REDUCTION SURGERY    . EYE SURGERY Right    cateracts  . HAMMER TOE SURGERY    . LEFT HEART CATH AND CORONARY ANGIOGRAPHY N/A 02/03/2018   Procedure: LEFT HEART CATH AND  CORONARY ANGIOGRAPHY;  Surgeon: Martinique, Peter M, MD;  Location: [facility];  Service: Cardiovascular;  Laterality: N/A;  . SHOULDER SURGERY Right    "I BROKE MY SHOUDLER  . THIGH SURGERY     "TO REMOVE A TUMOR "   Past Medical History:  Diagnosis Date  . Atrial fibrillation with RVR 05/19/2017  . Bipolar I disorder 01/23/2015  . CAD (coronary artery disease)    Nonobstructive; Managed by Dr. Bronson Ing  . Cardiomegaly 01/12/2018  . Chronic anticoagulation 05/30/2015  . Chronic back pain    Lower back  . Chronic diastolic CHF (congestive heart failure) 05/30/2015  . Diabetes mellitus, type 2, without complication   . Diabetic neuropathy 02/06/2016  . Dysrhythmia    A-Fib  . Essential hypertension   . Herpes genitalis in women 07/16/2015  . Hyperlipidemia   . Hypertension   . Hypoxia 02/01/2019  . Insomnia 01/23/2015  . Lipoma 02/08/2015  . Major depressive disorder   . Migraine headache with aura 02/12/2016  . 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[place]) 04/21/2019  . NAFL (nonalcoholic fatty liver) 123XX123  . OSA (obstructive sleep apnea) 02/24/2019   10/09/2018 - HST  - AHI 40.6   . Pulmonary hypertension   . Renal insufficiency    Managed by Dr. Wallace Keller  . RLS (restless legs syndrome) 04/27/2015   BP 98/66   Pulse 63   Temp 97.7 F (36.5 C)   Ht 5\' 2"  (1.575 m)   Wt 197 lb 12.8 oz (89.7 kg)   SpO2 91%   BMI 36.18 kg/m   Opioid Risk Score:   Fall Risk Score:  `1  Depression screen PHQ 2/9  Depression screen [facility] 2/9 08/22/2019 08/08/2019 06/27/2019 06/06/2019 05/20/2019 04/25/2019 03/04/2019  Decreased Interest 0 0 0 0 0 0 0  Down, Depressed, Hopeless 0 0 0 0 0 0 0  PHQ - 2 Score 0 0 0 0 0 0 0  Altered sleeping 0 - 0 - - - 0  Tired, decreased energy 3 - 0 - - - 0  Change in appetite 0 - 0 - - - 0  Feeling bad or failure about yourself  0 - 0 - - - 0  Trouble concentrating 0 - 0 - - - 0  Moving slowly or fidgety/restless 0 - 0 - - - 0  Suicidal thoughts 0 - 0 - - -  0  PHQ-9 Score 3 - 0 - - - 0  Difficult doing work/chores Somewhat difficult - - - - - -  Some recent data might be hidden  Review of Systems  Constitutional: Positive for appetite change.  Respiratory: Positive for apnea and shortness of breath.   Neurological:       Spasms tingling  All other systems reviewed and are negative.      Objective:   Physical Exam Awake, alert, appropriate, accompanied by husband, R shoulder in sling, NAD Massive muscle spasm- chronic- around L5/S1 on R MS: LLE- HF 4/5, KE 4+/5, DF and PF 5/5 RLE- HF 5-/5, KE 5-/5, DF and PF 5/5  Neuro: Intact sensation to light touch   10/20   L1-L2: Broad-based disc bulge with a tiny right paracentral disc protrusion. Mild bilateral facet arthropathy. No evidence of neural foraminal stenosis. No central canal stenosis.  L2-L3: Mild broad-based disc bulge. Moderate right and mild left facet arthropathy. No evidence of neural foraminal stenosis. No central canal stenosis.  L3-L4: Broad-based disc bulge. Moderate right and mild left facet arthropathy. No evidence of neural foraminal stenosis. No central canal stenosis.  L4-L5: Broad-based disc bulge with a small broad left foraminal disc protrusion. Moderate bilateral facet arthropathy small bilateral facet effusions. No evidence of neural foraminal stenosis. No central canal stenosis.  L5-S1: No significant disc bulge. Severe bilateral facet arthropathy. Small bilateral facet joint effusions. No evidence of neural foraminal stenosis. No central canal stenosis.  IMPRESSION: 1. No acute osseous injury of the lumbar spine. 2. No evidence of critical spinal stenosis. No evidence of nerve root impingement. 3. Lumbar spine spondylosis as described above.  Assessment & Plan:   Patient is a 74 yr old with Afib, CHF, CAD, and DM- with CKD- Stage III here for right low back pain with sciatica here for evaluation.    1. Robaxin/Methocarbemol- 500  mg 3x/day as needed- up to 4x/week- no more than 4x/week.    2. Magnesium 400 mg - 1-3 tabs/day- work your way up to 3 tabs/day if possible.   3. Tennis balls- 2-4 minutes of firm pressure- no massage- and can move to another spot after the time is done.  Youtube has videos  4. Will wait on epidural steroid injections- until next appointment.   5. Will get her back for trigger point injections. Use a 27 gauge needle.   6. Increase Lyrica to 200 mg 2x/day-   7. Can prescribe Percocet next month when she's due.   8. F/U in 1-2 weeks   I spent a total of 50 minutes on appointment- 30 minutes spent on educating pt on MRI, and educating on med changes.

## 2019-08-22 NOTE — Telephone Encounter (Signed)
Patient says she will take a half dose for a few days

## 2019-08-22 NOTE — ED Triage Notes (Signed)
Pt C/O Sob and cough that Saturday. Pt reports shoulder replacement on Friday 08/19/2019.

## 2019-08-22 NOTE — ED Provider Notes (Signed)
[facility] EMERGENCY DEPARTMENT Provider Note   CSN: [id number] Arrival date & time: 08/22/19  2028     History Chief Complaint  Patient presents with  . Shortness of Breath    Amber Stephenson is a 74 y.o. female.  Patient is a 74 year old female with past medical history of congestive heart failure, coronary artery disease, A. fib, bipolar, hypertension, hyperlipidemia.  She was admitted to the hospital 1 week ago for right shoulder replacement.  This was performed at [facility].  Patient states that since her surgery she has been short of breath.  This has been worsening over the past several days.  She denies any chest pain, fevers, or productive cough.  She does report some swelling to her legs.  The history is provided by the patient.  Shortness of Breath Severity:  Moderate Onset quality:  Sudden Duration:  1 week Timing:  Constant Progression:  Worsening Chronicity:  New Context: activity   Relieved by:  Nothing Worsened by:  Exertion Ineffective treatments:  None tried Associated symptoms: no fever        Past Medical History:  Diagnosis Date  . Atrial fibrillation with RVR 05/19/2017  . Bipolar I disorder 01/23/2015  . CAD (coronary artery disease)    Nonobstructive; Managed by Dr. Bronson Ing  . Cardiomegaly 01/12/2018  . Chronic anticoagulation 05/30/2015  . Chronic back pain    Lower back  . Chronic diastolic CHF (congestive heart failure) 05/30/2015  . Diabetes mellitus, type 2, without complication   . Diabetic neuropathy 02/06/2016  . Dysrhythmia    A-Fib  . Essential hypertension   . Herpes genitalis in women 07/16/2015  . Hyperlipidemia   . Hypertension   . Hypoxia 02/01/2019  . Insomnia 01/23/2015  . Lipoma 02/08/2015  . Major depressive disorder   . Migraine headache with aura 02/12/2016  . 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[place]) 04/21/2019  . NAFL (nonalcoholic fatty liver) 123XX123  . OSA (obstructive sleep apnea) 02/24/2019   10/09/2018  - HST  - AHI 40.6   . Pulmonary hypertension   . Renal insufficiency    Managed by Dr. Wallace Keller  . RLS (restless legs syndrome) 04/27/2015    Patient Active Problem List   Diagnosis Date Noted  . Chronic pain syndrome 08/22/2019  . Nerve pain 08/22/2019  . Myofascial pain dysfunction syndrome 08/22/2019  . S/P shoulder replacement, 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  . History of adenomatous polyp of colon 05/21/2019  . Polypharmacy 05/21/2019  . Benign paroxysmal positional vertigo due to bilateral vestibular disorder 05/20/2019  . Hyperlipidemia associated with type 2 diabetes mellitus ([place]) 05/20/2019  . Vertigo 04/25/2019  . 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[place]) 04/21/2019  . OSA (obstructive sleep apnea) 02/24/2019  . Allergic rhinitis 02/24/2019  . Acute on chronic respiratory failure with hypoxia ([place]) 01/22/2019  . 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 Morbid obesity ([place]) 07/06/2018  . Cardiomegaly 01/12/2018  . Non-alcoholic fatty liver disease 01/12/2018  . Mild intermittent asthma 01/12/2018  . Senile osteoporosis 12/15/2017  . Asthma 11/12/2017  . Fibromyalgia 03/19/2017  . Migraine headache with aura 02/12/2016  . Diabetic neuropathy associated with type 2 diabetes mellitus ([place]) 02/06/2016  . Bipolar 1 disorder, manic, moderate ([place]) 10/04/2015  . CHF (congestive heart failure) ([place]) 10/04/2015  . Depression, recurrent ([place]) 10/03/2015  . Herpes genitalis in women 07/16/2015  . Long term current use of anticoagulant 05/30/2015  . Family history of coronary arteriosclerosis 05/30/2015  . Chronic diastolic heart failure ([place]) 05/30/2015  . Dyspnea on  exertion   . Hypertension associated with diabetes ([place])   . Restless legs 04/27/2015  . Lipoma 02/08/2015  . Bipolar disorder ([place]) 01/23/2015  . Insomnia 01/23/2015  . Chronic back pain 01/04/2015  . Chronic atrial fibrillation ([place]) 01/04/2015  . DM2 (diabetes mellitus, type 2) ([place])     Past Surgical History:    Procedure Laterality Date  . BREAST REDUCTION SURGERY    . EYE SURGERY Right    cateracts  . HAMMER TOE SURGERY    . LEFT HEART CATH AND CORONARY ANGIOGRAPHY N/A 02/03/2018   Procedure: LEFT HEART CATH AND CORONARY ANGIOGRAPHY;  Surgeon: Martinique, Peter M, MD;  Location: [place] [facility];  Service: Cardiovascular;  Laterality: N/A;  . SHOULDER SURGERY Right    "I BROKE MY SHOUDLER  . THIGH SURGERY     "TO REMOVE A TUMOR "     OB History   No obstetric history on file.     Family History  Problem Relation Age of Onset  . Diabetes Mother   . Heart disease Mother   . Heart disease Brother   . Heart disease Sister        CABG  . Alcohol abuse Sister   . Mental illness Brother   . Diabetes Brother     Social History   Tobacco Use  . Smoking status: Never Smoker  . Smokeless tobacco: Never Used  Substance Use Topics  . Alcohol use: No    Alcohol/week: 0.0 standard drinks  . Drug use: No    Home Medications Prior to Admission medications   Medication Sig Start Date End Date Taking? Authorizing Provider  acetaminophen (TYLENOL) 325 MG tablet Take 325-650 mg by mouth every 6 (six) hours as needed for mild pain, moderate pain or headache.    [provider]  ARIPiprazole (ABILIFY) 10 MG tablet Take 1 tablet (10 mg total) by mouth 2 (two) times daily. 07/01/19   Plovsky, Berneta Sages, MD  atorvastatin (LIPITOR) 40 MG tablet Take 1 tablet (40 mg total) by mouth daily. 05/20/19   Baruch Gouty, FNP  budesonide-formoterol (SYMBICORT) 80-4.5 MCG/ACT inhaler Inhale 2 puffs into the lungs 2 (two) times daily. Patient taking differently: Inhale 2 puffs into the lungs 2 (two) times daily as needed (shortness of breath).  03/04/18   [name], DO  carbamazepine (CARBATROL) 100 MG 12 hr capsule Take 1 capsule (100 mg total) by mouth daily. 07/01/19 06/30/20  Plovsky, Berneta Sages, MD  Cholecalciferol (VITAMIN D3) 1000 UNITS CAPS Take 1,000 Units by mouth daily.    [provider]  diltiazem (CARDIZEM CD) 120 MG 24 hr capsule TAKE 1 CAPSULE AT BEDTIME Patient taking differently: Take 120 mg by mouth daily.  11/04/18   Herminio Commons, MD  ELIQUIS 5 MG TABS tablet TAKE 1 TABLET TWICE A DAY Patient taking differently: Take 5 mg by mouth 2 (two) times daily.  08/01/19   Baruch Gouty, FNP  Eszopiclone (ESZOPICLONE) 3 MG TABS Take 1 tablet (3 mg total) by mouth at bedtime as needed. Take immediately before bedtime Patient taking differently: Take 3 mg by mouth at bedtime as needed (sleep). Take immediately before bedtime 12/23/18 12/23/19  Norma Fredrickson, MD  furosemide (LASIX) 40 MG tablet Take 1 tablet (40 mg total) by mouth every morning. 02/15/19   Herminio Commons, MD  ibuprofen (ADVIL) 800 MG tablet TAKE 1 TABLET BY MOUTH EVERY 8 HOURS AS NEEDED 08/01/19   Dettinger, Fransisca Kaufmann, MD  Insulin Glargine (  LANTUS SOLOSTAR) 100 UNIT/ML Solostar Pen INJECT 60 UNITS UNDER THE SKIN TWICE A DAY Patient taking differently: Inject 60 Units into the skin 2 (two) times daily.  01/25/18   Herminio Commons, MD  LORazepam (ATIVAN) 1 MG tablet Take 1-2 tablets (1-2 mg total) by mouth See admin instructions. Take 1 tab (1 mg) in the morning and 2 tabs (2 mg ) at bedtime 03/24/19   Plovsky, Berneta Sages, MD  methocarbamol (ROBAXIN) 500 MG tablet Take 1 tablet (500 mg total) by mouth every 8 (eight) hours as needed for muscle spasms. 08/22/19   Lovorn, Jinny Blossom, MD  metoprolol tartrate (LOPRESSOR) 100 MG tablet Take 1 tablet (100 mg total) by mouth 2 (two) times daily. Take 100mg  + 25mg  for a total of 125mg  twice a day. 02/04/19   Johnson, Clanford L, MD  metoprolol tartrate (LOPRESSOR) 25 MG tablet TAKE 1 TABLET TWICE A DAY (TO BE TAKEN ALONG WITH THE LOPRESSOR 100 MG TWICE A DAY) 04/25/19   Herminio Commons, MD  naloxone [facility]) nasal spray 4 mg/0.1 mL Place 1 spray into the nose once as needed (opiod overdose).    [provider]  oxyCODONE-acetaminophen (PERCOCET/ROXICET)  5-325 MG tablet Take 1 tablet by mouth 4 (four) times daily as needed (pain.). 08/19/19   Netta Cedars, MD  potassium chloride SA (KLOR-CON) 20 MEQ tablet Take 1 tablet (20 mEq total) by mouth daily. Patient taking differently: Take 20 mEq by mouth every evening.  07/07/19   Herminio Commons, MD  pregabalin (LYRICA) 200 MG capsule Take 1 capsule (200 mg total) by mouth 2 (two) times daily. 08/22/19   Lovorn, Jinny Blossom, MD  PROAIR HFA 108 [id number] Base) MCG/ACT inhaler Inhale 1-2 puffs into the lungs every 6 (six) hours as needed. As needed 02/18/19   [provider]  Semaglutide (RYBELSUS) 7 MG TABS Take 7 mg by mouth daily.  06/17/19   [provider]  SUMAtriptan (IMITREX) 100 MG tablet Take 1 tablet (100 mg total) by mouth every 2 (two) hours as needed for migraine. May repeat in 2 hours if headache persists or recurs. 05/16/19   [name], MD  topiramate (TOPAMAX) 25 MG tablet Take 3 tablets every night Patient taking differently: Take 75 mg by mouth at bedtime.  07/04/19   Cameron Sprang, MD  traZODone (DESYREL) 100 MG tablet Take 4 tablets (400 mg total) by mouth at bedtime. 07/01/19   Plovsky, Berneta Sages, MD  triamcinolone ointment (KENALOG) 0.5 % Apply 1 application topically 2 (two) times daily. Patient taking differently: Apply 1 application topically 2 (two) times daily as needed (skin irritation/rash.).  06/14/19   Hawks, Alyse Low A, FNP  VASCEPA 1 g CAPS TAKE 2 CAPSULES TWICE A DAY WITH MEALS Patient taking differently: Take 2 g by mouth in the morning and at bedtime.  02/15/19   Sharion Balloon, FNP    Allergies    Ivp dye [iodinated diagnostic agents] and Latex  Review of Systems   Review of Systems  Constitutional: Negative for fever.  Respiratory: Positive for shortness of breath.   All other systems reviewed and are negative.   Physical Exam Updated Vital Signs BP 128/87 (BP Location: Left Arm)   Pulse (!) 104   Temp 98.5 F (36.9 C) (Oral)   Resp (!) 23   Ht  5\' 2"  (1.575 m)   Wt 86.6 kg   SpO2 (!) 89%   BMI 34.93 kg/m   Physical Exam Vitals and nursing note reviewed.  Constitutional:  General: She is not in acute distress.    Appearance: She is well-developed. She is not diaphoretic.  HENT:     Head: Normocephalic and atraumatic.  Cardiovascular:     Rate and Rhythm: Normal rate and regular rhythm.     Heart sounds: No murmur. No friction rub. No gallop.   Pulmonary:     Effort: Pulmonary effort is normal. No respiratory distress.     Breath sounds: Normal breath sounds. No wheezing.  Abdominal:     General: Bowel sounds are normal. There is no distension.     Palpations: Abdomen is soft.     Tenderness: There is no abdominal tenderness.  Musculoskeletal:        General: Normal range of motion.     Cervical back: Normal range of motion and neck supple.     Right lower leg: No tenderness. Edema present.     Left lower leg: No tenderness. Edema present.     Comments: There is trace edema of both lower extremities.  Skin:    General: Skin is warm and dry.  Neurological:     Mental Status: She is alert and oriented to person, place, and time.     ED Results / Procedures / Treatments   Labs (all labs ordered are listed, but only abnormal results are displayed) Labs Reviewed - No data to display  EKG EKG Interpretation  Date/Time:  Monday August 22 2019 20:35:31 EDT Ventricular Rate:  100 PR Interval:    QRS Duration: 74 QT Interval:  373 QTC Calculation: 462 R Axis:   -26 Text Interpretation: Atrial fibrillation Ventricular premature complex Borderline left axis deviation Nonspecific T abnrm, anterolateral leads Confirmed by Veryl Speak [phone]) on 08/22/2019 8:45:20 PM   Radiology No results found.  Procedures Procedures (including critical care time)  Medications Ordered in ED Medications - No data to display  ED Course  I have reviewed the triage vital signs and the nursing notes.  Pertinent labs &  imaging results that were available during my care of the patient were reviewed by me and considered in my medical decision making (see chart for details).    MDM Rules/Calculators/A&P  Patient presenting with complaints of shortness of breath.  She recently underwent shoulder replacement surgery at [facility].  Patient has an oxygen requirement here with oxygen saturations in the upper 80s/lower 90s.  I am uncertain as to the etiology of her hypoxia as her chest x-ray is nondiagnostic.  Patient was off of her Eliquis for this surgery, but has since resumed taking it.  The possibility of PE has been entertained.  Patient does have a history of an anaphylactic reaction to IV contrast and I feel as though a VQ scan may be a more appropriate diagnostic test.  As the patient is on Eliquis and anticoagulated, I feel as though admission for overnight observation and possible VQ scan in the morning is the best course of action.  I discussed with Dr. Olevia Bowens who agrees to admit.  CRITICAL CARE Performed by: Veryl Speak Total critical care time: 45 minutes Critical care time was exclusive of separately billable procedures and treating other patients. Critical care was necessary to treat or prevent imminent or life-threatening deterioration. Critical care was time spent personally by me on the following activities: development of treatment plan with patient and/or surrogate as well as nursing, discussions with consultants, evaluation of patient's response to treatment, examination of patient, obtaining history from patient or surrogate, ordering and performing  treatments and interventions, ordering and review of laboratory studies, ordering and review of radiographic studies, pulse oximetry and re-evaluation of patient's condition.   Final Clinical Impression(s) / ED Diagnoses Final diagnoses:  None    Rx / DC Orders ED Discharge Orders    None       Veryl Speak, MD 08/22/19  2310

## 2019-08-22 NOTE — H&P (Signed)
History and Physical    Amber Stephenson [id number] DOB: 01-29-1946 DOA: 08/22/2019  PCP: Baruch Gouty, FNP   Patient coming from: Home.   I have personally briefly reviewed patient's old medical records in [place]  Chief Complaint: Shortness of breath.  HPI: Amber Stephenson is a 74 y.o. female with medical history significant of chronic atrial fibrillation on Eliquis, bipolar 1 disorder, major depressive disorder, nonobstructive CAD, chronic diastolic CHF, hypertension, type 2 diabetes, diabetic neuropathy, chronic lower back pain, hyperlipidemia, hypoxia, insomnia, migraine headaches, nonalcoholic asteatotic hepatitis, OSA, pulmonary hypertension, restless leg syndrome, renal insufficiency who underwent and is coming now to the emergency department due to dyspnea that has been progressively worse since Saturday.  She denies chest pain, palpitations, dizziness, diaphoresis, nausea or emesis, PND, orthopnea or recent pitting edema of the lower extremities.  No fever, chills, productive cough, wheezing or hemoptysis.  Denies abdominal pain, diarrhea, constipation, melena or hematochezia.  No dysuria, frequency, but complains of incontinence.  No polyuria, polydipsia, polyphagia or blurred vision.  ED Course: Initial vital signs temperature 98.5 F, pulse 104, respirations 23, blood pressure 128/87 mmHg and O2 sat 89% on room air.   White count is 13.0, hemoglobin 12.3 g/dL and platelets 187.  PT 15.9 and INR 1.3.  PTT was 32.  D-dimer was 1.03 mcg/mL.  Procalcitonin was less than 0.10.  EKG with A. fib at 100 bpm.  Troponin x2 was normal.  BNP was 151.0 pg/mL.  CMP shows a calcium of 8.4 and glucose of 200 mg/dL.  All other values are within normal range.  Her chest radiograph shows low volumes.  Review of Systems: As per HPI otherwise 10 point review of systems negative.   Past Medical History:  Diagnosis Date  . Atrial fibrillation with RVR 05/19/2017  . Bipolar I disorder  01/23/2015  . CAD (coronary artery disease)    Nonobstructive; Managed by Dr. Bronson Ing  . Cardiomegaly 01/12/2018  . Chronic anticoagulation 05/30/2015  . Chronic back pain    Lower back  . Chronic diastolic CHF (congestive heart failure) 05/30/2015  . Diabetes mellitus, type 2, without complication   . Diabetic neuropathy 02/06/2016  . Dysrhythmia    A-Fib  . Essential hypertension   . Herpes genitalis in women 07/16/2015  . Hyperlipidemia   . Hypertension   . Hypoxia 02/01/2019  . Insomnia 01/23/2015  . Lipoma 02/08/2015  . Major depressive disorder   . Migraine headache with aura 02/12/2016  . 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[place]) 04/21/2019  . NAFL (nonalcoholic fatty liver) 123XX123  . OSA (obstructive sleep apnea) 02/24/2019   10/09/2018 - HST  - AHI 40.6   . Pulmonary hypertension   . Renal insufficiency    Managed by Dr. Wallace Keller  . RLS (restless legs syndrome) 04/27/2015    Past Surgical History:  Procedure Laterality Date  . BREAST REDUCTION SURGERY    . EYE SURGERY Right    cateracts  . HAMMER TOE SURGERY    . LEFT HEART CATH AND CORONARY ANGIOGRAPHY N/A 02/03/2018   Procedure: LEFT HEART CATH AND CORONARY ANGIOGRAPHY;  Surgeon: Martinique, Peter M, MD;  Location: [facility];  Service: Cardiovascular;  Laterality: N/A;  . SHOULDER SURGERY Right    "I BROKE MY SHOUDLER  . THIGH SURGERY     "TO REMOVE A TUMOR "     reports that she has never smoked. She has never used smokeless tobacco. She reports that she does not drink alcohol or  use drugs.  Allergies  Allergen Reactions  . Ivp Dye [Iodinated Diagnostic Agents] Anaphylaxis and Swelling    Throat closes  . Latex     Latex tape pulls skin with it    Family History  Problem Relation Age of Onset  . Diabetes Mother   . Heart disease Mother   . Heart disease Brother   . Heart disease Sister        CABG  . Alcohol abuse Sister   . Mental illness Brother   . Diabetes Brother    Prior to Admission  medications   Medication Sig Start Date End Date Taking? Authorizing Provider  acetaminophen (TYLENOL) 325 MG tablet Take 325-650 mg by mouth every 6 (six) hours as needed for mild pain, moderate pain or headache.    [provider]  ARIPiprazole (ABILIFY) 10 MG tablet Take 1 tablet (10 mg total) by mouth 2 (two) times daily. 07/01/19   Plovsky, Berneta Sages, MD  atorvastatin (LIPITOR) 40 MG tablet Take 1 tablet (40 mg total) by mouth daily. 05/20/19   Baruch Gouty, FNP  budesonide-formoterol (SYMBICORT) 80-4.5 MCG/ACT inhaler Inhale 2 puffs into the lungs 2 (two) times daily. Patient taking differently: Inhale 2 puffs into the lungs 2 (two) times daily as needed (shortness of breath).  03/04/18   [name], DO  carbamazepine (CARBATROL) 100 MG 12 hr capsule Take 1 capsule (100 mg total) by mouth daily. 07/01/19 06/30/20  Plovsky, Berneta Sages, MD  Cholecalciferol (VITAMIN D3) 1000 UNITS CAPS Take 1,000 Units by mouth daily.    [provider]  diltiazem (CARDIZEM CD) 120 MG 24 hr capsule TAKE 1 CAPSULE AT BEDTIME Patient taking differently: Take 120 mg by mouth daily.  11/04/18   Herminio Commons, MD  ELIQUIS 5 MG TABS tablet TAKE 1 TABLET TWICE A DAY Patient taking differently: Take 5 mg by mouth 2 (two) times daily.  08/01/19   Baruch Gouty, FNP  Eszopiclone (ESZOPICLONE) 3 MG TABS Take 1 tablet (3 mg total) by mouth at bedtime as needed. Take immediately before bedtime Patient taking differently: Take 3 mg by mouth at bedtime as needed (sleep). Take immediately before bedtime 12/23/18 12/23/19  Norma Fredrickson, MD  furosemide (LASIX) 40 MG tablet Take 1 tablet (40 mg total) by mouth every morning. 02/15/19   Herminio Commons, MD  ibuprofen (ADVIL) 800 MG tablet TAKE 1 TABLET BY MOUTH EVERY 8 HOURS AS NEEDED 08/01/19   Dettinger, Fransisca Kaufmann, MD  Insulin Glargine (LANTUS SOLOSTAR) 100 UNIT/ML Solostar Pen INJECT 60 UNITS UNDER THE SKIN TWICE A DAY Patient taking differently: Inject  60 Units into the skin 2 (two) times daily.  01/25/18   Herminio Commons, MD  LORazepam (ATIVAN) 1 MG tablet Take 1-2 tablets (1-2 mg total) by mouth See admin instructions. Take 1 tab (1 mg) in the morning and 2 tabs (2 mg ) at bedtime 03/24/19   Plovsky, Berneta Sages, MD  methocarbamol (ROBAXIN) 500 MG tablet Take 1 tablet (500 mg total) by mouth every 8 (eight) hours as needed for muscle spasms. 08/22/19   Lovorn, Jinny Blossom, MD  metoprolol tartrate (LOPRESSOR) 100 MG tablet Take 1 tablet (100 mg total) by mouth 2 (two) times daily. Take 100mg  + 25mg  for a total of 125mg  twice a day. 02/04/19   Johnson, Clanford L, MD  metoprolol tartrate (LOPRESSOR) 25 MG tablet TAKE 1 TABLET TWICE A DAY (TO BE TAKEN ALONG WITH THE LOPRESSOR 100 MG TWICE A DAY) 04/25/19   Bronson Ing,  Lenise Herald, MD  naloxone [facility]) nasal spray 4 mg/0.1 mL Place 1 spray into the nose once as needed (opiod overdose).    [provider]  oxyCODONE-acetaminophen (PERCOCET/ROXICET) 5-325 MG tablet Take 1 tablet by mouth 4 (four) times daily as needed (pain.). 08/19/19   Netta Cedars, MD  potassium chloride SA (KLOR-CON) 20 MEQ tablet Take 1 tablet (20 mEq total) by mouth daily. Patient taking differently: Take 20 mEq by mouth every evening.  07/07/19   Herminio Commons, MD  pregabalin (LYRICA) 200 MG capsule Take 1 capsule (200 mg total) by mouth 2 (two) times daily. 08/22/19   Lovorn, Jinny Blossom, MD  PROAIR HFA 108 [phone] Base) MCG/ACT inhaler Inhale 1-2 puffs into the lungs every 6 (six) hours as needed. As needed 02/18/19   [provider]  Semaglutide (RYBELSUS) 7 MG TABS Take 7 mg by mouth daily.  06/17/19   [provider]  SUMAtriptan (IMITREX) 100 MG tablet Take 1 tablet (100 mg total) by mouth every 2 (two) hours as needed for migraine. May repeat in 2 hours if headache persists or recurs. 05/16/19   [name], MD  topiramate (TOPAMAX) 25 MG tablet Take 3 tablets every night Patient taking differently: Take 75 mg by  mouth at bedtime.  07/04/19   Cameron Sprang, MD  traZODone (DESYREL) 100 MG tablet Take 4 tablets (400 mg total) by mouth at bedtime. 07/01/19   Plovsky, Berneta Sages, MD  triamcinolone ointment (KENALOG) 0.5 % Apply 1 application topically 2 (two) times daily. Patient taking differently: Apply 1 application topically 2 (two) times daily as needed (skin irritation/rash.).  06/14/19   Hawks, Alyse Low A, FNP  VASCEPA 1 g CAPS TAKE 2 CAPSULES TWICE A DAY WITH MEALS Patient taking differently: Take 2 g by mouth in the morning and at bedtime.  02/15/19   Sharion Balloon, FNP    Physical Exam: Vitals:   08/22/19 2030 08/22/19 2035 08/22/19 2037 08/22/19 2045  BP: (!) 122/29 128/87    Pulse:  (!) 104  95  Resp:  (!) 23  (!) 24  Temp:  98.5 F (36.9 C)    TempSrc:  Oral    SpO2:  (!) 89%  91%  Weight:   86.6 kg   Height:   5\' 2"  (1.575 m)     Constitutional: NAD, calm, comfortable Eyes: PERRL, lids and conjunctivae normal ENMT: Mucous membranes are moist. Posterior pharynx clear of any exudate or lesions.  Neck: normal, supple, no masses, no thyromegaly Respiratory: Decreased breath sounds in bases, no wheezing, no crackles. Normal respiratory effort. No accessory muscle use.  Cardiovascular: Irregularly irregular, no murmurs / rubs / gallops. No extremity edema. 2+ pedal pulses. No carotid bruits.  Abdomen: Obese, nondistended.  BS positive.  Soft, no tenderness, no masses palpated. No hepatosplenomegaly. Musculoskeletal: Right shoulder is immobilized.  No clubbing / cyanosis. Good ROM, no contractures. Normal muscle tone.  Skin: no rashes, lesions, ulcers on very limited dermatological examination. Neurologic: CN 2-12 grossly intact. Sensation intact, DTR normal. Strength 5/5 in all 4.  Psychiatric: Normal judgment and insight. Alert and oriented x 3. Normal mood.   Labs on Admission: I have personally reviewed following labs and imaging studies  CBC: Recent Labs  Lab 08/20/19 0225  08/22/19 2054  WBC  --  13.0*  NEUTROABS  --  8.3*  HGB 12.3 12.3  HCT 38.9 38.5  MCV  --  94.4  PLT  --  123XX123   Basic Metabolic Panel: Recent  Labs  Lab 08/20/19 0225 08/22/19 2054  NA 142 138  K 4.1 4.5  CL 108 108  CO2 23 24  GLUCOSE 269* 200*  BUN 20 22  CREATININE 1.06* 0.82  CALCIUM 8.7* 8.4*   GFR: Estimated Creatinine Clearance: 63.3 mL/min (by C-G formula based on SCr of 0.82 mg/dL). Liver Function Tests: Recent Labs  Lab 08/22/19 2054  AST 24  ALT 17  ALKPHOS 109  BILITOT 0.9  PROT 7.0  ALBUMIN 3.6   No results for input(s): LIPASE, AMYLASE in the last 168 hours. No results for input(s): AMMONIA in the last 168 hours. Coagulation Profile: No results for input(s): INR, PROTIME in the last 168 hours. Cardiac Enzymes: No results for input(s): CKTOTAL, CKMB, CKMBINDEX, TROPONINI in the last 168 hours. BNP (last 3 results) No results for input(s): PROBNP in the last 8760 hours. HbA1C: No results for input(s): HGBA1C in the last 72 hours. CBG: Recent Labs  Lab 08/19/19 1215 08/19/19 1706 08/19/19 2044 08/20/19 0729 08/20/19 1203  GLUCAP 153* 183* 278* 156* 112*   Lipid Profile: No results for input(s): CHOL, HDL, LDLCALC, TRIG, CHOLHDL, LDLDIRECT in the last 72 hours. Thyroid Function Tests: No results for input(s): TSH, T4TOTAL, FREET4, T3FREE, THYROIDAB in the last 72 hours. Anemia Panel: No results for input(s): VITAMINB12, FOLATE, FERRITIN, TIBC, IRON, RETICCTPCT in the last 72 hours. Urine analysis:    Component Value Date/Time   COLORURINE STRAW (A) 01/21/2019 1513   APPEARANCEUR CLEAR 01/21/2019 1513   APPEARANCEUR Clear 10/01/2017 1424   LABSPEC 1.009 01/21/2019 1513   PHURINE 7.0 01/21/2019 1513   GLUCOSEU NEGATIVE 01/21/2019 1513   HGBUR NEGATIVE 01/21/2019 1513   BILIRUBINUR NEGATIVE 01/21/2019 1513   BILIRUBINUR Negative 10/01/2017 1424   KETONESUR NEGATIVE 01/21/2019 1513   PROTEINUR NEGATIVE 01/21/2019 1513   UROBILINOGEN 0.2  07/23/2015 1350   NITRITE NEGATIVE 01/21/2019 1513   LEUKOCYTESUR NEGATIVE 01/21/2019 1513    Radiological Exams on Admission: DG Chest Port 1 View  Result Date: 08/22/2019 CLINICAL DATA:  Shortness of breath, cough for 2 days, recent orthopedic surgery EXAM: PORTABLE CHEST 1 VIEW COMPARISON:  03/27/2019 FINDINGS: Single frontal view of the chest demonstrates stable enlargement of the cardiac silhouette. Lung volumes are diminished with crowding of the central vasculature. No airspace disease, effusion, or pneumothorax. Right shoulder arthroplasty partially visualized and unremarkable. IMPRESSION: 1. Low lung volumes.  No acute airspace disease. Electronically Signed   By: Randa Ngo M.D.   On: 08/22/2019 21:18   Echo 01/22/2019  IMPRESSIONS   1. The left ventricle has normal systolic function, with an ejection  fraction of 55-60%. The cavity size was normal. There is mildly increased  left ventricular wall thickness. Left ventricular diastolic function could  not be evaluated secondary to  atrial fibrillation.  2. The right ventricle has normal systolic function. The cavity was  normal. There is no increase in right ventricular wall thickness.  3. The mitral valve is grossly normal.  4. The aortic valve is grossly normal. Aortic valve regurgitation is mild  by color flow Doppler.  5. The aorta is normal unless otherwise noted.  6. The aortic root and ascending aorta are normal in size and structure.  7. The atrial septum is grossly normal.  EKG: Independently reviewed.  Vent. rate 100 BPM PR interval * ms QRS duration 74 ms QT/QTc 373/462 ms P-R-T axes * -26 123 Atrial fibrillation Ventricular premature complex Borderline left axis deviation Nonspecific T abnrm, anterolateral leads  Assessment/Plan Principal Problem:  Dyspnea Suspect atelectasis D-dimer elevation might be due to recent surgery. Incentive spirometry. Check VQ scan in a.m. Continue supplemental  oxygen.  Active Problems:   DM2 (diabetes mellitus, type 2) (HCC) Carbohydrate modified diet. Continue Lantus 60 units twice daily. CBG monitoring with regular insulin sliding scale.    Hyperlipidemia Resume atorvastatin after med rec performed.    Chronic back pain Analgesics as needed.    Chronic atrial fibrillation (HCC) CHA?DS?-VASc Score of at least 5. Continue Eliquis. On Cardizem and metoprolol. Resume negative chronotropic after med rec.    Bipolar disorder ([place]) On Abilify, Carbatrol and lorazepam. Resume after med rec done.    Chronic diastolic heart failure (HCC) No signs of fluid overload. Continue furosemide and beta-blocker.    OSA (obstructive sleep apnea) CPAP at bedtime.    Mild intermittent asthma Supplemental oxygen as needed. Bronchodilators as needed    CAD (coronary artery disease) On Eliquis, atorvastatin and metoprolol    Hypertension Resume meds after med rec. Monitor BP.    Obesity (BMI 30.0-34.9) Lifestyle modifications. To be discussed with PCP.    Hypocalcemia Recheck calcium level. If low hypocalcemia work-up algorithm.   DVT prophylaxis: Eliquis. Code Status: Full code. Family Communication:  Disposition Plan: Overnight observation for dyspnea. Consults called:  Admission status: Observation/telemetry.   Reubin Milan MD [facility]  If 7PM-7AM, please contact night-coverage [email or web address]  08/22/2019, 11:18 PM   This document was prepared using Dragon voice recognition software and may contain some unintended transcription errors.

## 2019-08-22 NOTE — Telephone Encounter (Signed)
Please inform her that she would be at risk for a CHF exacerbation if she does not take Lasix.  However, I do understand her predicament at the moment.

## 2019-08-22 NOTE — Patient Instructions (Signed)
  Patient is a 74 yr old with Afib, CHF, CAD, and DM- with CKD- Stage III here for right low back pain with sciatica here for evaluation.    1. Robaxin/Methocarbemol- 500 mg 3x/day as needed- up to 4x/week- no more than 4x/week.    2. Magnesium 400 mg - 1-3 tabs/day- work your way up to 3 tabs/day if possible.   3. Tennis balls- 2-4 minutes of firm pressure- no massage- and can move to another spot after the time is done.  Youtube has videos  4. Will wait on epidural steroid injections- until next appointment.   5. Will get her back for trigger point injections. Use a 27 gauge needle.   6. Increase Lyrica to 200 mg 2x/day-   7. Can prescribe Percocet next month when she's due.   8. F/U in 1-2 weeks

## 2019-08-23 ENCOUNTER — Encounter: Payer: Self-pay | Admitting: *Deleted

## 2019-08-23 ENCOUNTER — Telehealth: Payer: Self-pay | Admitting: *Deleted

## 2019-08-23 ENCOUNTER — Observation Stay (HOSPITAL_COMMUNITY): Payer: Medicare Other

## 2019-08-23 DIAGNOSIS — F3112 Bipolar disorder, current episode manic without psychotic features, moderate: Secondary | ICD-10-CM | POA: Diagnosis not present

## 2019-08-23 DIAGNOSIS — R0600 Dyspnea, unspecified: Secondary | ICD-10-CM

## 2019-08-23 DIAGNOSIS — G4733 Obstructive sleep apnea (adult) (pediatric): Secondary | ICD-10-CM | POA: Diagnosis not present

## 2019-08-23 DIAGNOSIS — I5032 Chronic diastolic (congestive) heart failure: Secondary | ICD-10-CM

## 2019-08-23 DIAGNOSIS — E1169 Type 2 diabetes mellitus with other specified complication: Secondary | ICD-10-CM

## 2019-08-23 DIAGNOSIS — I482 Chronic atrial fibrillation, unspecified: Secondary | ICD-10-CM | POA: Diagnosis not present

## 2019-08-23 DIAGNOSIS — R0602 Shortness of breath: Secondary | ICD-10-CM | POA: Diagnosis not present

## 2019-08-23 DIAGNOSIS — Z794 Long term (current) use of insulin: Secondary | ICD-10-CM

## 2019-08-23 DIAGNOSIS — J4520 Mild intermittent asthma, uncomplicated: Secondary | ICD-10-CM | POA: Diagnosis not present

## 2019-08-23 LAB — CBC WITH DIFFERENTIAL/PLATELET
Abs Immature Granulocytes: 0.12 10*3/uL — ABNORMAL HIGH (ref 0.00–0.07)
Basophils Absolute: 0.1 10*3/uL (ref 0.0–0.1)
Basophils Relative: 1 %
Eosinophils Absolute: 0.2 10*3/uL (ref 0.0–0.5)
Eosinophils Relative: 2 %
HCT: 32.5 % — ABNORMAL LOW (ref 36.0–46.0)
Hemoglobin: 10.5 g/dL — ABNORMAL LOW (ref 12.0–15.0)
Immature Granulocytes: 1 %
Lymphocytes Relative: 30 %
Lymphs Abs: 3.1 10*3/uL (ref 0.7–4.0)
MCH: 30.8 pg (ref 26.0–34.0)
MCHC: 32.3 g/dL (ref 30.0–36.0)
MCV: 95.3 fL (ref 80.0–100.0)
Monocytes Absolute: 0.7 10*3/uL (ref 0.1–1.0)
Monocytes Relative: 6 %
Neutro Abs: 6.3 10*3/uL (ref 1.7–7.7)
Neutrophils Relative %: 60 %
Platelets: 180 10*3/uL (ref 150–400)
RBC: 3.41 MIL/uL — ABNORMAL LOW (ref 3.87–5.11)
RDW: 13.8 % (ref 11.5–15.5)
WBC: 10.4 10*3/uL (ref 4.0–10.5)
nRBC: 0.2 % (ref 0.0–0.2)

## 2019-08-23 LAB — BASIC METABOLIC PANEL
Anion gap: 4 — ABNORMAL LOW (ref 5–15)
BUN: 17 mg/dL (ref 8–23)
CO2: 23 mmol/L (ref 22–32)
Calcium: 8 mg/dL — ABNORMAL LOW (ref 8.9–10.3)
Chloride: 112 mmol/L — ABNORMAL HIGH (ref 98–111)
Creatinine, Ser: 0.65 mg/dL (ref 0.44–1.00)
GFR calc Af Amer: >60 mL/min (ref >60)
GFR calc non Af Amer: >60 mL/min (ref >60)
Glucose, Bld: 243 mg/dL — ABNORMAL HIGH (ref 70–99)
Potassium: 4.3 mmol/L (ref 3.5–5.1)
Sodium: 139 mmol/L (ref 135–145)

## 2019-08-23 LAB — PROCALCITONIN: Procalcitonin: <0.1 ng/mL

## 2019-08-23 LAB — GLUCOSE, CAPILLARY
Glucose-Capillary: 218 mg/dL — ABNORMAL HIGH (ref 70–99)
Glucose-Capillary: 213 mg/dL — ABNORMAL HIGH (ref 70–99)
Glucose-Capillary: 231 mg/dL — ABNORMAL HIGH (ref 70–99)

## 2019-08-23 LAB — SARS CORONAVIRUS 2 (TAT 6-24 HRS): SARS Coronavirus 2: NEGATIVE

## 2019-08-23 MED ORDER — INSULIN ASPART 100 UNIT/ML ~~LOC~~ SOLN
0.0000 [IU] | Freq: Three times a day (TID) | SUBCUTANEOUS | Status: DC
  Administered 2019-08-23 (×2): 5 [IU] via SUBCUTANEOUS

## 2019-08-23 MED ORDER — OXYCODONE-ACETAMINOPHEN 5-325 MG PO TABS
1.0000 | ORAL_TABLET | Freq: Four times a day (QID) | ORAL | Status: DC | PRN
  Administered 2019-08-23: 2 via ORAL
  Filled 2019-08-23: qty 2

## 2019-08-23 MED ORDER — OXYCODONE-ACETAMINOPHEN 5-325 MG PO TABS
1.0000 | ORAL_TABLET | Freq: Once | ORAL | Status: AC
  Administered 2019-08-23: 1 via ORAL
  Filled 2019-08-23: qty 1

## 2019-08-23 MED ORDER — INSULIN ASPART 100 UNIT/ML ~~LOC~~ SOLN
0.0000 [IU] | Freq: Every day | SUBCUTANEOUS | Status: DC
  Administered 2019-08-23: 2 [IU] via SUBCUTANEOUS

## 2019-08-23 MED ORDER — TECHNETIUM TO 99M ALBUMIN AGGREGATED
1.6000 | Freq: Once | INTRAVENOUS | Status: AC | PRN
  Administered 2019-08-23: 1.6 via INTRAVENOUS

## 2019-08-23 MED ORDER — INSULIN ASPART 100 UNIT/ML ~~LOC~~ SOLN: 4.0000 [IU] | Freq: Three times a day (TID) | SUBCUTANEOUS | Status: DC

## 2019-08-23 NOTE — Plan of Care (Signed)
  Problem: Acute Rehab PT Goals(only PT should resolve) Goal: Pt Will Go Supine/Side To Sit Outcome: Progressing Flowsheets (Taken 08/23/2019 1422) Pt will go Supine/Side to Sit: with supervision Goal: Patient Will Transfer Sit To/From Stand Outcome: Progressing Flowsheets (Taken 08/23/2019 1422) Patient will transfer sit to/from stand:  with supervision  with modified independence Goal: Pt Will Transfer Bed To Chair/Chair To Bed Outcome: Progressing Flowsheets (Taken 08/23/2019 1422) Pt will Transfer Bed to Chair/Chair to Bed:  with supervision  with modified independence Goal: Pt Will Ambulate Outcome: Progressing Flowsheets (Taken 08/23/2019 1422) Pt will Ambulate:  75 feet  with supervision  with cane   2:22 PM, 08/23/19 Lonell Grandchild, MPT Physical Therapist with [facility] [phone] office [phone] mobile phone

## 2019-08-23 NOTE — Discharge Summary (Signed)
Orthopedic Discharge Summary        Physician Discharge Summary  Patient ID: Amber Stephenson MRN: [id number] DOB/AGE: 1945-12-16 74 y.o.  Admit date: 08/19/2019 Discharge date: 08/20/19   Procedures:  Procedure(s) (LRB): REVERSE SHOULDER ARTHROPLASTY (Right)  Attending Physician:  Dr. Esmond Plants  Admission Diagnoses:   Right shoulder cuff arthropathy   Discharge Diagnoses:  Right shoulder cuff arthropathy   Past Medical History:  Diagnosis Date  . Atrial fibrillation with RVR 05/19/2017  . Bipolar I disorder 01/23/2015  . CAD (coronary artery disease)    Nonobstructive; Managed by Dr. Bronson Ing  . Cardiomegaly 01/12/2018  . Chronic anticoagulation 05/30/2015  . Chronic back pain    Lower back  . Chronic diastolic CHF (congestive heart failure) 05/30/2015  . Diabetes mellitus, type 2, without complication   . Diabetic neuropathy 02/06/2016  . Dysrhythmia    A-Fib  . Essential hypertension   . Herpes genitalis in women 07/16/2015  . Hyperlipidemia   . Hypertension   . Hypoxia 02/01/2019  . Insomnia 01/23/2015  . Lipoma 02/08/2015  . Major depressive disorder   . Migraine headache with aura 02/12/2016  . 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[place]) 04/21/2019  . NAFL (nonalcoholic fatty liver) 4/53/6468  . OSA (obstructive sleep apnea) 02/24/2019   10/09/2018 - HST  - AHI 40.6   . Pulmonary hypertension   . Renal insufficiency    Managed by Dr. Wallace Keller  . RLS (restless legs syndrome) 04/27/2015    PCP: Baruch Gouty, FNP   Discharged Condition: good  Hospital Course:  Patient underwent the above stated procedure on 08/19/2019. Patient tolerated the procedure well and brought to the recovery room in good condition and subsequently to the floor. Patient had an uncomplicated hospital course and was stable for discharge.   Disposition: Discharge disposition: 01-Home or Self Care      with follow up in 2 weeks   Follow-up Information    Netta Cedars, MD. Call  in 2 weeks.   Specialty: Orthopedic Surgery Why: [phone] Contact information: [address] [place] [address] [phone]           Discharge Instructions    Call MD / Call 911   Complete by: As directed    If you experience chest pain or shortness of breath, CALL 911 and be transported to the hospital emergency room.  If you develope a fever above 101 F, pus (white drainage) or increased drainage or redness at the wound, or calf pain, call your surgeon's office.   Constipation Prevention   Complete by: As directed    Drink plenty of fluids.  Prune juice may be helpful.  You may use a stool softener, such as Colace (over the counter) 100 mg twice a day.  Use MiraLax (over the counter) for constipation as needed.   Diet - low sodium heart healthy   Complete by: As directed    Discharge instructions   Complete by: As directed    See Dr. Gilberto Better discharge instructions   Driving restrictions   Complete by: As directed    No driving   Increase activity slowly as tolerated   Complete by: As directed    Lifting restrictions   Complete by: As directed    No lifting      Allergies as of 08/20/2019      Reactions   Ivp Dye [iodinated Diagnostic Agents] Anaphylaxis, Swelling   Throat closes   Latex    Latex tape pulls  skin with it      Medication List    TAKE these medications   acetaminophen 325 MG tablet Commonly known as: TYLENOL Take 325-650 mg by mouth every 6 (six) hours as needed for mild pain, moderate pain or headache.   ARIPiprazole 10 MG tablet Commonly known as: ABILIFY Take 1 tablet (10 mg total) by mouth 2 (two) times daily.   atorvastatin 40 MG tablet Commonly known as: LIPITOR Take 1 tablet (40 mg total) by mouth daily.   budesonide-formoterol 80-4.5 MCG/ACT inhaler Commonly known as: Symbicort Inhale 2 puffs into the lungs 2 (two) times daily. What changed:   when to take this  reasons to take this   carbamazepine 100  MG 12 hr capsule Commonly known as: CARBATROL Take 1 capsule (100 mg total) by mouth daily.   diltiazem 120 MG 24 hr capsule Commonly known as: CARDIZEM CD TAKE 1 CAPSULE AT BEDTIME What changed: when to take this   Eliquis 5 MG Tabs tablet Generic drug: apixaban TAKE 1 TABLET TWICE A DAY What changed: how much to take   Eszopiclone 3 MG Tabs Commonly known as: eszopiclone Take 1 tablet (3 mg total) by mouth at bedtime as needed. Take immediately before bedtime What changed: reasons to take this   furosemide 40 MG tablet Commonly known as: LASIX Take 1 tablet (40 mg total) by mouth every morning.   ibuprofen 800 MG tablet Commonly known as: ADVIL TAKE 1 TABLET BY MOUTH EVERY 8 HOURS AS NEEDED   insulin glargine 100 UNIT/ML Solostar Pen Commonly known as: Lantus SoloStar INJECT 60 UNITS UNDER THE SKIN TWICE A DAY What changed:   how much to take  how to take this  when to take this  additional instructions   LORazepam 1 MG tablet Commonly known as: Ativan Take 1-2 tablets (1-2 mg total) by mouth See admin instructions. Take 1 tab (1 mg) in the morning and 2 tabs (2 mg ) at bedtime   metoprolol tartrate 100 MG tablet Commonly known as: LOPRESSOR Take 1 tablet (100 mg total) by mouth 2 (two) times daily. Take 168m + 242mfor a total of 12524mwice a day.   metoprolol tartrate 25 MG tablet Commonly known as: LOPRESSOR TAKE 1 TABLET TWICE A DAY (TO BE TAKEN ALONG WITH THE LOPRESSOR 100 MG TWICE A DAY)   Narcan 4 MG/0.1ML Liqd nasal spray kit Generic drug: naloxone Place 1 spray into the nose once as needed (opiod overdose).   oxyCODONE-acetaminophen 5-325 MG tablet Commonly known as: PERCOCET/ROXICET Take 1 tablet by mouth 4 (four) times daily as needed (pain.).   potassium chloride SA 20 MEQ tablet Commonly known as: KLOR-CON Take 1 tablet (20 mEq total) by mouth daily. What changed: when to take this   ProAir HFA 108 (90 Base) MCG/ACT inhaler Generic  drug: albuterol Inhale 1-2 puffs into the lungs every 6 (six) hours as needed. As needed   Rybelsus 7 MG Tabs Generic drug: Semaglutide Take 7 mg by mouth daily.   SUMAtriptan 100 MG tablet Commonly known as: IMITREX Take 1 tablet (100 mg total) by mouth every 2 (two) hours as needed for migraine. May repeat in 2 hours if headache persists or recurs.   topiramate 25 MG tablet Commonly known as: TOPAMAX Take 3 tablets every night What changed:   how much to take  how to take this  when to take this  additional instructions   traZODone 100 MG tablet Commonly known as: DESYREL Take 4  tablets (400 mg total) by mouth at bedtime.   triamcinolone ointment 0.5 % Commonly known as: KENALOG Apply 1 application topically 2 (two) times daily. What changed:   when to take this  reasons to take this   Vascepa 1 g capsule Generic drug: icosapent Ethyl TAKE 2 CAPSULES TWICE A DAY WITH MEALS What changed: See the new instructions.   Vitamin D3 25 MCG (1000 UT) Caps Take 1,000 Units by mouth daily.         Signed: Ventura Bruns 08/23/2019, 8:55 AM  [facility] is now [employer] [facility] [address] [place], [place], [place] [address] Phone: [place]

## 2019-08-23 NOTE — TOC Progression Note (Signed)
Transition of Care [facility]) - Progression Note    Patient Details  Name: Amber Stephenson MRN: [id number] Date of Birth: 01-Mar-1946  Transition of Care [facility]) CM/SW Contact  Boneta Lucks, RN Phone Number: 08/23/2019, 2:01 PM  Clinical Narrative:   Patient admitted with Dyspnea, PT recommending HHPT. TOC called patient to set up [facility]. Per patient MD advised her to wait two week for therapy.  TOC updated patient that they could discuss PT with her PCP at follow up.     Expected Discharge Plan and Services        Expected Discharge Date: 08/23/19                   Readmission Risk Interventions No flowsheet data found.

## 2019-08-23 NOTE — Discharge Summary (Addendum)
Physician Discharge Summary  Amber Stephenson EHM:[id number] DOB: [birth year] DOA: 08/22/2019  PCP: Baruch Gouty, FNP Orthopedics: Dr. Lucas Mallow  Admit date: 08/22/2019 Discharge date: 08/23/2019  Admitted From:  HOME  Disposition:  HOME   Recommendations for Outpatient Follow-up:  1. Follow up with PCP in 1 weeks 2. Follow-up with orthopedist as scheduled  Home Health:  PT  Discharge Condition: STABLE   CODE STATUS: FULL    Brief Hospitalization Summary: Please see all hospital notes, images, labs for full details of the hospitalization. ADMISSION HPI: Amber Stephenson is a 74 y.o. female with medical history significant of chronic atrial fibrillation on Eliquis, bipolar 1 disorder, major depressive disorder, nonobstructive CAD, chronic diastolic CHF, hypertension, type 2 diabetes, diabetic neuropathy, chronic lower back pain, hyperlipidemia, hypoxia, insomnia, migraine headaches, nonalcoholic asteatotic hepatitis, OSA, pulmonary hypertension, restless leg syndrome, renal insufficiency who underwent and is coming now to the emergency department due to dyspnea that has been progressively worse since Saturday.  She denies chest pain, palpitations, dizziness, diaphoresis, nausea or emesis, PND, orthopnea or recent pitting edema of the lower extremities.  No fever, chills, productive cough, wheezing or hemoptysis.  Denies abdominal pain, diarrhea, constipation, melena or hematochezia.  No dysuria, frequency, but complains of incontinence.  No polyuria, polydipsia, polyphagia or blurred vision.  ED Course: Initial vital signs temperature 98.5 F, pulse 104, respirations 23, blood pressure 128/87 mmHg and O2 sat 89% on room air.   White count is 13.0, hemoglobin 12.3 g/dL and platelets 187.  PT 15.9 and INR 1.3.  PTT was 32.  D-dimer was 1.03 mcg/mL.  Procalcitonin was less than 0.10.  EKG with A. fib at 100 bpm.  Troponin x2 was normal.  BNP was 151.0 pg/mL.  CMP shows a calcium of 8.4 and glucose  of 200 mg/dL.  All other values are within normal range.  Her chest radiograph shows low volumes.   Patient was admitted for observation for dyspnea symptoms.  Patient is postop from shoulder surgery and there was some concerns of pulmonary embolus.  The patient had a slight elevation in D-dimer.  The patient had a VQ scan that showed low probability of pulmonary embolus.  CT chest was not pursued in the setting of severe anaphylaxis reaction to contrast dye.  The patient symptoms are thought most likely secondary to atelectasis.  She was seen by physical therapy and she worked with them and felt much better.  The patient felt well to go home.  No changes made to her medication regimen.  The patient has close outpatient follow-up arranged with her orthopedist and with her primary care providers and pain management specialist.  Also of note, the patient reports that her symptoms started shortly after she started taking Robaxin as a new medication.  Symptoms started about 2 hours after taking the first dose of robaxin.   I have asked her to discontinue taking the Robaxin as she may be having an adverse side effect versus intolerance of the medication.  I asked her to discuss the reaction with her pain management specialist and primary care provider.  She verbalizes understanding.  The patient is medically stable to discharge home today with close outpatient follow-up.   Discharge Diagnoses:  Principal Problem:   Dyspnea Active Problems:   DM2 (diabetes mellitus, type 2) (HCC)   Hyperlipidemia   Chronic back pain   Chronic atrial fibrillation (HCC)   Bipolar disorder (HCC)   Chronic diastolic heart failure (HCC)   OSA (obstructive sleep apnea)  Mild intermittent asthma   CAD (coronary artery disease)   Hypertension   Obesity (BMI 30.0-34.9)   Hypocalcemia   Discharge Instructions:  Allergies as of 08/23/2019      Reactions   Ivp Dye [iodinated Diagnostic Agents] Anaphylaxis, Swelling    Throat closes   Latex    Latex tape pulls skin with it      Medication List    STOP taking these medications   methocarbamol 500 MG tablet Commonly known as: Robaxin     TAKE these medications   acetaminophen 325 MG tablet Commonly known as: TYLENOL Take 325-650 mg by mouth every 6 (six) hours as needed for mild pain, moderate pain or headache.   ARIPiprazole 10 MG tablet Commonly known as: ABILIFY Take 1 tablet (10 mg total) by mouth 2 (two) times daily.   atorvastatin 40 MG tablet Commonly known as: LIPITOR Take 1 tablet (40 mg total) by mouth daily.   budesonide-formoterol 80-4.5 MCG/ACT inhaler Commonly known as: Symbicort Inhale 2 puffs into the lungs 2 (two) times daily. What changed:   when to take this  reasons to take this   carbamazepine 100 MG 12 hr capsule Commonly known as: CARBATROL Take 1 capsule (100 mg total) by mouth daily.   diltiazem 120 MG 24 hr capsule Commonly known as: CARDIZEM CD TAKE 1 CAPSULE AT BEDTIME What changed: when to take this   Eliquis 5 MG Tabs tablet Generic drug: apixaban TAKE 1 TABLET TWICE A DAY What changed: how much to take   Eszopiclone 3 MG Tabs Commonly known as: eszopiclone Take 1 tablet (3 mg total) by mouth at bedtime as needed. Take immediately before bedtime What changed: reasons to take this   furosemide 40 MG tablet Commonly known as: LASIX Take 1 tablet (40 mg total) by mouth every morning.   ibuprofen 800 MG tablet Commonly known as: ADVIL TAKE 1 TABLET BY MOUTH EVERY 8 HOURS AS NEEDED   insulin glargine 100 UNIT/ML Solostar Pen Commonly known as: Lantus SoloStar INJECT 60 UNITS UNDER THE SKIN TWICE A DAY What changed:   how much to take  how to take this  when to take this  additional instructions   LORazepam 1 MG tablet Commonly known as: Ativan Take 1-2 tablets (1-2 mg total) by mouth See admin instructions. Take 1 tab (1 mg) in the morning and 2 tabs (2 mg ) at bedtime   metoprolol  tartrate 100 MG tablet Commonly known as: LOPRESSOR Take 1 tablet (100 mg total) by mouth 2 (two) times daily. Take 113m + 250mfor a total of 12524mwice a day.   metoprolol tartrate 25 MG tablet Commonly known as: LOPRESSOR TAKE 1 TABLET TWICE A DAY (TO BE TAKEN ALONG WITH THE LOPRESSOR 100 MG TWICE A DAY)   Narcan 4 MG/0.1ML Liqd nasal spray kit Generic drug: naloxone Place 1 spray into the nose once as needed (opiod overdose).   oxyCODONE-acetaminophen 5-325 MG tablet Commonly known as: PERCOCET/ROXICET Take 1 tablet by mouth 4 (four) times daily as needed (pain.).   potassium chloride SA 20 MEQ tablet Commonly known as: KLOR-CON Take 1 tablet (20 mEq total) by mouth daily. What changed: when to take this   pregabalin 200 MG capsule Commonly known as: Lyrica Take 1 capsule (200 mg total) by mouth 2 (two) times daily.   ProAir HFA 108 (90 Base) MCG/ACT inhaler Generic drug: albuterol Inhale 1-2 puffs into the lungs every 6 (six) hours as needed. As needed  Rybelsus 7 MG Tabs Generic drug: Semaglutide Take 7 mg by mouth daily.   SUMAtriptan 100 MG tablet Commonly known as: IMITREX Take 1 tablet (100 mg total) by mouth every 2 (two) hours as needed for migraine. May repeat in 2 hours if headache persists or recurs.   topiramate 25 MG tablet Commonly known as: TOPAMAX Take 3 tablets every night What changed:   how much to take  how to take this  when to take this  additional instructions   traZODone 100 MG tablet Commonly known as: DESYREL Take 4 tablets (400 mg total) by mouth at bedtime.   triamcinolone ointment 0.5 % Commonly known as: KENALOG Apply 1 application topically 2 (two) times daily. What changed:   when to take this  reasons to take this   Vascepa 1 g capsule Generic drug: icosapent Ethyl TAKE 2 CAPSULES TWICE A DAY WITH MEALS What changed: See the new instructions.   Vitamin D3 25 MCG (1000 UT) Caps Take 1,000 Units by mouth  daily.       Allergies  Allergen Reactions  . Ivp Dye [Iodinated Diagnostic Agents] Anaphylaxis and Swelling    Throat closes  . Latex     Latex tape pulls skin with it   Allergies as of 08/23/2019      Reactions   Ivp Dye [iodinated Diagnostic Agents] Anaphylaxis, Swelling   Throat closes   Latex    Latex tape pulls skin with it      Medication List    STOP taking these medications   methocarbamol 500 MG tablet Commonly known as: Robaxin     TAKE these medications   acetaminophen 325 MG tablet Commonly known as: TYLENOL Take 325-650 mg by mouth every 6 (six) hours as needed for mild pain, moderate pain or headache.   ARIPiprazole 10 MG tablet Commonly known as: ABILIFY Take 1 tablet (10 mg total) by mouth 2 (two) times daily.   atorvastatin 40 MG tablet Commonly known as: LIPITOR Take 1 tablet (40 mg total) by mouth daily.   budesonide-formoterol 80-4.5 MCG/ACT inhaler Commonly known as: Symbicort Inhale 2 puffs into the lungs 2 (two) times daily. What changed:   when to take this  reasons to take this   carbamazepine 100 MG 12 hr capsule Commonly known as: CARBATROL Take 1 capsule (100 mg total) by mouth daily.   diltiazem 120 MG 24 hr capsule Commonly known as: CARDIZEM CD TAKE 1 CAPSULE AT BEDTIME What changed: when to take this   Eliquis 5 MG Tabs tablet Generic drug: apixaban TAKE 1 TABLET TWICE A DAY What changed: how much to take   Eszopiclone 3 MG Tabs Commonly known as: eszopiclone Take 1 tablet (3 mg total) by mouth at bedtime as needed. Take immediately before bedtime What changed: reasons to take this   furosemide 40 MG tablet Commonly known as: LASIX Take 1 tablet (40 mg total) by mouth every morning.   ibuprofen 800 MG tablet Commonly known as: ADVIL TAKE 1 TABLET BY MOUTH EVERY 8 HOURS AS NEEDED   insulin glargine 100 UNIT/ML Solostar Pen Commonly known as: Lantus SoloStar INJECT 60 UNITS UNDER THE SKIN TWICE A DAY What  changed:   how much to take  how to take this  when to take this  additional instructions   LORazepam 1 MG tablet Commonly known as: Ativan Take 1-2 tablets (1-2 mg total) by mouth See admin instructions. Take 1 tab (1 mg) in the morning and 2 tabs (2  mg ) at bedtime   metoprolol tartrate 100 MG tablet Commonly known as: LOPRESSOR Take 1 tablet (100 mg total) by mouth 2 (two) times daily. Take 115m + 247mfor a total of 1258mwice a day.   metoprolol tartrate 25 MG tablet Commonly known as: LOPRESSOR TAKE 1 TABLET TWICE A DAY (TO BE TAKEN ALONG WITH THE LOPRESSOR 100 MG TWICE A DAY)   Narcan 4 MG/0.1ML Liqd nasal spray kit Generic drug: naloxone Place 1 spray into the nose once as needed (opiod overdose).   oxyCODONE-acetaminophen 5-325 MG tablet Commonly known as: PERCOCET/ROXICET Take 1 tablet by mouth 4 (four) times daily as needed (pain.).   potassium chloride SA 20 MEQ tablet Commonly known as: KLOR-CON Take 1 tablet (20 mEq total) by mouth daily. What changed: when to take this   pregabalin 200 MG capsule Commonly known as: Lyrica Take 1 capsule (200 mg total) by mouth 2 (two) times daily.   ProAir HFA 108 (90 Base) MCG/ACT inhaler Generic drug: albuterol Inhale 1-2 puffs into the lungs every 6 (six) hours as needed. As needed   Rybelsus 7 MG Tabs Generic drug: Semaglutide Take 7 mg by mouth daily.   SUMAtriptan 100 MG tablet Commonly known as: IMITREX Take 1 tablet (100 mg total) by mouth every 2 (two) hours as needed for migraine. May repeat in 2 hours if headache persists or recurs.   topiramate 25 MG tablet Commonly known as: TOPAMAX Take 3 tablets every night What changed:   how much to take  how to take this  when to take this  additional instructions   traZODone 100 MG tablet Commonly known as: DESYREL Take 4 tablets (400 mg total) by mouth at bedtime.   triamcinolone ointment 0.5 % Commonly known as: KENALOG Apply 1 application  topically 2 (two) times daily. What changed:   when to take this  reasons to take this   Vascepa 1 g capsule Generic drug: icosapent Ethyl TAKE 2 CAPSULES TWICE A DAY WITH MEALS What changed: See the new instructions.   Vitamin D3 25 MCG (1000 UT) Caps Take 1,000 Units by mouth daily.       Procedures/Studies: NM Pulmonary Perfusion  Result Date: 08/23/2019 CLINICAL DATA:  Shortness of breath and chest pain since Saturday, shoulder surgery last Friday, dyspnea of unclear etiology EXAM: NUCLEAR MEDICINE PERFUSION LUNG SCAN TECHNIQUE: Perfusion images were obtained in multiple projections after intravenous injection of radiopharmaceutical. Ventilation scans intentionally deferred if perfusion scan and chest x-ray adequate for interpretation during COVID 19 epidemic. RADIOPHARMACEUTICALS:  1.6 mCi Tc-95m77m IV COMPARISON:  None Correlation: Chest radiograph 08/22/2019 FINDINGS: Single tiny subsegmental perfusion defect LEFT lower lobe. Photopenic defect at upper RIGHT lung from orthopedic hardware at RIGHT shoulder joint. No other perfusion defects. Chest radiograph demonstrates bibasilar atelectasis. Perfusion significantly better than expected from chest radiograph. Findings represent a very low probability for pulmonary embolism. IMPRESSION: Very low probability for pulmonary embolism. Electronically Signed   By: MarkLavonia Dana.   On: 08/23/2019 11:04   DG Chest Port 1 View  Result Date: 08/22/2019 CLINICAL DATA:  Shortness of breath, cough for 2 days, recent orthopedic surgery EXAM: PORTABLE CHEST 1 VIEW COMPARISON:  03/27/2019 FINDINGS: Single frontal view of the chest demonstrates stable enlargement of the cardiac silhouette. Lung volumes are diminished with crowding of the central vasculature. No airspace disease, effusion, or pneumothorax. Right shoulder arthroplasty partially visualized and unremarkable. IMPRESSION: 1. Low lung volumes.  No acute airspace disease. Electronically  Signed  By: Randa Ngo M.D.   On: 08/22/2019 21:18   DG Shoulder Right Port  Result Date: 08/19/2019 CLINICAL DATA:  Status post RIGHT shoulder replacement surgery. EXAM: PORTABLE RIGHT SHOULDER COMPARISON:  CT of the RIGHT shoulder on 12/02/2016 FINDINGS: The patient has undergone RIGHT shoulder arthroplasty. The hardware is intact. No evidence for dislocation or acute fracture. Deformity of the proximal aspect of the humerus appears stable compared to prior CT exam. RIGHT lung apex is clear. IMPRESSION: Postoperative changes. No evidence for acute abnormality. Electronically Signed   By: Nolon Nations M.D.   On: 08/19/2019 15:24      Subjective: The patient reports that she is feeling a lot better today.  She is sitting up in a chair.  She says that she is concerned that her symptoms started shortly after she started taking Robaxin.  I have counseled her to stop the medication and discuss with her pain management specialist.  She is no longer having shortness of breath.  She is having no chest pain.  Her shoulder pain is controlled with the pain medications ordered by her orthopedist.  She reports she does have follow-up with her orthopedist already arranged and she is to start physical therapy in 2 weeks as directed by her orthopedist.  Discharge Exam: Vitals:   08/23/19 0941 08/23/19 1203  BP:  118/64  Pulse:  94  Resp:  18  Temp:  98 F (36.7 C)  SpO2: 97% 100%   Vitals:   08/23/19 0419 08/23/19 0800 08/23/19 0941 08/23/19 1203  BP: 110/73 122/65  118/64  Pulse: 72 84  94  Resp: 20 18  18   Temp: 97.8 F (36.6 C) 98 F (36.7 C)  98 F (36.7 C)  TempSrc: Oral Oral  Oral  SpO2: 96% 99% 97% 100%  Weight:      Height:       General: Pt is alert, awake, not in acute distress Cardiovascular: RRR, S1/S2 +, no rubs, no gallops Respiratory: CTA bilaterally, no wheezing, no rhonchi Abdominal: Soft, NT, ND, bowel sounds + Extremities: Right shoulder in a sling,  no cyanosis    The results of significant diagnostics from this hospitalization (including imaging, microbiology, ancillary and laboratory) are listed below for reference.     Microbiology: Recent Results (from the past 240 hour(s))  SARS CORONAVIRUS 2 (TAT 6-24 HRS) Nasopharyngeal Nasopharyngeal Swab     Status: None   Collection Time: 08/16/19  2:39 PM   Specimen: Nasopharyngeal Swab  Result Value Ref Range Status   SARS Coronavirus 2 NEGATIVE NEGATIVE Final    Comment: (NOTE) SARS-CoV-2 target nucleic acids are NOT DETECTED. The SARS-CoV-2 RNA is generally detectable in upper and lower respiratory specimens during the acute phase of infection. Negative results do not preclude SARS-CoV-2 infection, do not rule out co-infections with other pathogens, and should not be used as the sole basis for treatment or other patient management decisions. Negative results must be combined with clinical observations, patient history, and epidemiological information. The expected result is Negative. Fact Sheet for Patients: SugarRoll.be Fact Sheet for Healthcare Providers: https://www.woods-mathews.com/ This test is not yet approved or cleared by the [place] FDA and  has been authorized for detection and/or diagnosis of SARS-CoV-2 by FDA under an Emergency Use Authorization (EUA). This EUA will remain  in effect (meaning this test can be used) for the duration of the COVID-19 declaration under Section 56 4(b)(1) of the Act, 21 U.S.C. section 360bbb-3(b)(1), unless the authorization is terminated or revoked  sooner. Performed at [facility], [place] [address] [place], [place] [address]   SARS CORONAVIRUS 2 (TAT 6-24 HRS) Nasopharyngeal Nasopharyngeal Swab     Status: None   Collection Time: 08/23/19 12:23 AM   Specimen: Nasopharyngeal Swab  Result Value Ref Range Status   SARS Coronavirus 2 NEGATIVE NEGATIVE Final    Comment: (NOTE) SARS-CoV-2 target  nucleic acids are NOT DETECTED. The SARS-CoV-2 RNA is generally detectable in upper and lower respiratory specimens during the acute phase of infection. Negative results do not preclude SARS-CoV-2 infection, do not rule out co-infections with other pathogens, and should not be used as the sole basis for treatment or other patient management decisions. Negative results must be combined with clinical observations, patient history, and epidemiological information. The expected result is Negative. Fact Sheet for Patients: SugarRoll.be Fact Sheet for Healthcare Providers: https://www.woods-mathews.com/ This test is not yet approved or cleared by the [place] FDA and  has been authorized for detection and/or diagnosis of SARS-CoV-2 by FDA under an Emergency Use Authorization (EUA). This EUA will remain  in effect (meaning this test can be used) for the duration of the COVID-19 declaration under Section 56 4(b)(1) of the Act, 21 U.S.C. section 360bbb-3(b)(1), unless the authorization is terminated or revoked sooner. Performed at [facility], [place] [address], [place], [place] [address]      Labs: BNP (last 3 results) Recent Labs    01/21/19 1156 02/01/19 1349 08/22/19 2055  BNP 202.6* 260.0* 128.7*   Basic Metabolic Panel: Recent Labs  Lab 08/20/19 0225 08/22/19 2054 08/23/19 0551  NA 142 138 139  K 4.1 4.5 4.3  CL 108 108 112*  CO2 23 24 23   GLUCOSE 269* 200* 243*  BUN 20 22 17   CREATININE 1.06* 0.82 0.65  CALCIUM 8.7* 8.4* 8.0*   Liver Function Tests: Recent Labs  Lab 08/22/19 2054  AST 24  ALT 17  ALKPHOS 109  BILITOT 0.9  PROT 7.0  ALBUMIN 3.6   No results for input(s): LIPASE, AMYLASE in the last 168 hours. No results for input(s): AMMONIA in the last 168 hours. CBC: Recent Labs  Lab 08/20/19 0225 08/22/19 2054 08/23/19 0551  WBC  --  13.0* 10.4  NEUTROABS  --  8.3* 6.3  HGB 12.3 12.3 10.5*  HCT  38.9 38.5 32.5*  MCV  --  94.4 95.3  PLT  --  187 180   Cardiac Enzymes: No results for input(s): CKTOTAL, CKMB, CKMBINDEX, TROPONINI in the last 168 hours. BNP: Invalid input(s): POCBNP CBG: Recent Labs  Lab 08/20/19 0729 08/20/19 1203 08/23/19 0159 08/23/19 0807 08/23/19 1203  GLUCAP 156* 112* 218* 213* 231*   D-Dimer Recent Labs    08/22/19 2054  DDIMER 1.03*   Hgb A1c No results for input(s): HGBA1C in the last 72 hours. Lipid Profile No results for input(s): CHOL, HDL, LDLCALC, TRIG, CHOLHDL, LDLDIRECT in the last 72 hours. Thyroid function studies No results for input(s): TSH, T4TOTAL, T3FREE, THYROIDAB in the last 72 hours.  Invalid input(s): FREET3 Anemia work up No results for input(s): VITAMINB12, FOLATE, FERRITIN, TIBC, IRON, RETICCTPCT in the last 72 hours. Urinalysis    Component Value Date/Time   COLORURINE STRAW (A) 01/21/2019 1513   APPEARANCEUR CLEAR 01/21/2019 1513   APPEARANCEUR Clear 10/01/2017 1424   LABSPEC 1.009 01/21/2019 1513   PHURINE 7.0 01/21/2019 1513   GLUCOSEU NEGATIVE 01/21/2019 1513   HGBUR NEGATIVE 01/21/2019 1513   BILIRUBINUR NEGATIVE 01/21/2019 1513   BILIRUBINUR Negative  10/01/2017 [place] 01/21/2019 1513   PROTEINUR NEGATIVE 01/21/2019 1513   UROBILINOGEN 0.2 07/23/2015 1350   NITRITE NEGATIVE 01/21/2019 1513   LEUKOCYTESUR NEGATIVE 01/21/2019 1513   Sepsis Labs Invalid input(s): PROCALCITONIN,  WBC,  LACTICIDVEN Microbiology Recent Results (from the past 240 hour(s))  SARS CORONAVIRUS 2 (TAT 6-24 HRS) Nasopharyngeal Nasopharyngeal Swab     Status: None   Collection Time: 08/16/19  2:39 PM   Specimen: Nasopharyngeal Swab  Result Value Ref Range Status   SARS Coronavirus 2 NEGATIVE NEGATIVE Final    Comment: (NOTE) SARS-CoV-2 target nucleic acids are NOT DETECTED. The SARS-CoV-2 RNA is generally detectable in upper and lower respiratory specimens during the acute phase of infection.  Negative results do not preclude SARS-CoV-2 infection, do not rule out co-infections with other pathogens, and should not be used as the sole basis for treatment or other patient management decisions. Negative results must be combined with clinical observations, patient history, and epidemiological information. The expected result is Negative. Fact Sheet for Patients: SugarRoll.be Fact Sheet for Healthcare Providers: https://www.woods-mathews.com/ This test is not yet approved or cleared by the [place] FDA and  has been authorized for detection and/or diagnosis of SARS-CoV-2 by FDA under an Emergency Use Authorization (EUA). This EUA will remain  in effect (meaning this test can be used) for the duration of the COVID-19 declaration under Section 56 4(b)(1) of the Act, 21 U.S.C. section 360bbb-3(b)(1), unless the authorization is terminated or revoked sooner. Performed at [facility], [place] [address], [place], [place] [address]   SARS CORONAVIRUS 2 (TAT 6-24 HRS) Nasopharyngeal Nasopharyngeal Swab     Status: None   Collection Time: 08/23/19 12:23 AM   Specimen: Nasopharyngeal Swab  Result Value Ref Range Status   SARS Coronavirus 2 NEGATIVE NEGATIVE Final    Comment: (NOTE) SARS-CoV-2 target nucleic acids are NOT DETECTED. The SARS-CoV-2 RNA is generally detectable in upper and lower respiratory specimens during the acute phase of infection. Negative results do not preclude SARS-CoV-2 infection, do not rule out co-infections with other pathogens, and should not be used as the sole basis for treatment or other patient management decisions. Negative results must be combined with clinical observations, patient history, and epidemiological information. The expected result is Negative. Fact Sheet for Patients: SugarRoll.be Fact Sheet for Healthcare  Providers: https://www.woods-mathews.com/ This test is not yet approved or cleared by the [place] FDA and  has been authorized for detection and/or diagnosis of SARS-CoV-2 by FDA under an Emergency Use Authorization (EUA). This EUA will remain  in effect (meaning this test can be used) for the duration of the COVID-19 declaration under Section 56 4(b)(1) of the Act, 21 U.S.C. section 360bbb-3(b)(1), unless the authorization is terminated or revoked sooner. Performed at [facility], [place] [address], [place], [place] [address]    Time coordinating discharge:   SIGNED:  Irwin Brakeman, MD  [facility] 08/23/2019, 12:56 PM How to contact the [facility] Attending or Consulting provider [place] or covering provider during after hours [place], for this patient?  1. Check the care team in [facility] and look for a) attending/consulting [facility] provider listed and b) the [facility] team listed 2. Log into [email or web address] and use [place]'s universal password to access. If you do not have the password, please contact the hospital operator. 3. Locate the [facility] provider you are looking for under [facility] and page to a number that you can be directly reached. 4. If  you still have difficulty reaching the provider, please page the [facility] (Director on Call) for the Hospitalists listed on amion for assistance.

## 2019-08-23 NOTE — Evaluation (Signed)
Physical Therapy Evaluation Patient Details Name: Amber Stephenson MRN: [id number] DOB: 08/17/1945 Today's Date: 08/23/2019   History of Present Illness  Amber Stephenson is a 74 y.o. female with medical history significant of chronic atrial fibrillation on Eliquis, bipolar 1 disorder, major depressive disorder, nonobstructive CAD, chronic diastolic CHF, hypertension, type 2 diabetes, diabetic neuropathy, chronic lower back pain, hyperlipidemia, hypoxia, insomnia, migraine headaches, nonalcoholic asteatotic hepatitis, OSA, pulmonary hypertension, restless leg syndrome, renal insufficiency who underwent and is coming now to the emergency department due to dyspnea that has been progressively worse since Saturday.  She denies chest pain, palpitations, dizziness, diaphoresis, nausea or emesis, PND, orthopnea or recent pitting edema of the lower extremities.  No fever, chills, productive cough, wheezing or hemoptysis.  Denies abdominal pain, diarrhea, constipation, melena or hematochezia.  No dysuria, frequency, but complains of incontinence.  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    Clinical Impression  Patient functioning near baseline since right shoulder surgery for functional mobility and gait, patient slightly unsteady on feet requiring hand held assist for transfers and ambulation in room/hallway, limited for ambulation due to c/o fatigue and blurriness in eyes, on room air with SpO2 at 95% and left on room air after therapy.  Patient tolerated sitting up in chair after therapy.  Patient will benefit from continued physical therapy in hospital and recommended venue below to increase strength, balance, endurance for safe ADLs and gait.      Follow Up Recommendations Home health PT;Supervision for  mobility/OOB;Supervision - Intermittent    Equipment Recommendations  None recommended by PT    Recommendations for Other Services       Precautions / Restrictions Precautions Precautions: Fall Type of Shoulder Precautions: no ER, AROM for self-care tasks only SP 90, ABD 60, ER 30 degrees respectively Shoulder Interventions: Shoulder sling/immobilizer;At all times;Off for dressing/bathing/exercises;For comfort Required Braces or Orthoses: Sling Restrictions Weight Bearing Restrictions: Yes RUE Weight Bearing: Non weight bearing      Mobility  Bed Mobility Overal bed mobility: Needs Assistance Bed Mobility: Supine to Sit     Supine to sit: Min assist     General bed mobility comments: slow labored movement  Transfers Overall transfer level: Needs assistance Equipment used: 1 person hand held assist Transfers: Sit to/from Stand;Stand Pivot Transfers Sit to Stand: Min guard;Min assist Stand pivot transfers: Min guard       General transfer comment: increased time, labored movement  Ambulation/Gait Ambulation/Gait assistance: Min guard;Min assist Gait Distance (Feet): 45 Feet Assistive device: 1 person hand held assist Gait Pattern/deviations: Decreased step length - right;Decreased step length - left;Decreased stride length Gait velocity: decreased   General Gait Details: labored slow unsteady cadence without loss of balance, limited secondary to c/o fatigue and blurriness in eyes  Stairs            Wheelchair Mobility    Modified Rankin (Stroke Patients Only)       Balance Overall balance assessment: Needs assistance Sitting-balance support: Feet supported;No upper extremity supported Sitting balance-Leahy Scale: Good Sitting balance - Comments: seated at EOB   Standing balance support: During functional activity;No upper extremity supported Standing balance-Leahy Scale: Poor Standing balance comment: fair/poor requiring hand held assist  Pertinent Vitals/Pain Pain Score: 8  Pain Location: R shoulder  Pain Descriptors / Indicators: Discomfort;Sore Pain Intervention(s): Limited activity within patient's tolerance;Monitored during session    [place] expects to be discharged to:: Private residence Living Arrangements: Spouse/significant other Available Help at Discharge: Family;Available 24 hours/day Type of Home: House Home Access: Stairs to enter Entrance Stairs-Rails: Left Entrance Stairs-Number of Steps: 5 Home Layout: One level Home Equipment: Shower seat - built in;Cane - single point;Grab bars - toilet;Grab bars - tub/shower Additional Comments: 3 wheeled walker    Prior Function Level of Independence: Needs assistance   Gait / Transfers Assistance Needed: Household and short distanced community ambulator  ADL's / Homemaking Assistance Needed: 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        Hand Dominance   Dominant Hand: Right    Extremity/Trunk Assessment   Upper Extremity Assessment Upper Extremity Assessment: RUE deficits/detail RUE Deficits / Details: in immobilizer RUE: Unable to fully assess due to pain RUE Sensation: WNL RUE Coordination: WNL    Lower Extremity Assessment Lower Extremity Assessment: Generalized weakness    Cervical / Trunk Assessment Cervical / Trunk Assessment: Normal  Communication   Communication: No difficulties  Cognition Arousal/Alertness: Awake/alert Behavior During Therapy: WFL for tasks assessed/performed Overall Cognitive Status: Within Functional Limits for tasks assessed                                        General Comments      Exercises     Assessment/Plan    PT Assessment Patient needs continued PT services  PT Problem List Decreased strength;Decreased activity tolerance;Decreased balance;Decreased mobility       PT Treatment Interventions  Balance training;Gait training;Stair training;Functional mobility training;Therapeutic activities;Therapeutic exercise;Patient/family education    PT Goals (Current goals can be found in the Care Plan section)  Acute Rehab PT Goals Patient Stated Goal: return home with family to assist PT Goal Formulation: With patient Time For Goal Achievement: 08/26/19 Potential to Achieve Goals: Good    Frequency Min 3X/week   Barriers to discharge        Co-evaluation               AM-PAC PT "6 Clicks" Mobility  Outcome Measure Help needed turning from your back to your side while in a flat bed without using bedrails?: A Little Help needed moving from lying on your back to sitting on the side of a flat bed without using bedrails?: A Little Help needed moving to and from a bed to a chair (including a wheelchair)?: A Little Help needed standing up from a chair using your arms (e.g., wheelchair or bedside chair)?: A Little Help needed to walk in hospital room?: A Little Help needed climbing 3-5 steps with a railing? : A Lot 6 Click Score: 17    End of Session   Activity Tolerance: Patient tolerated treatment well;Patient limited by fatigue Patient left: in chair;with call bell/phone within reach Nurse Communication: Mobility status PT Visit Diagnosis: Unsteadiness on feet (R26.81);Other abnormalities of gait and mobility (R26.89);Muscle weakness (generalized) (M62.81)    Time: [id number] PT Time Calculation (min) (ACUTE ONLY): 27 min   Charges:   PT Evaluation $PT Eval Moderate Complexity: 1 Mod PT Treatments $Therapeutic Activity: 23-37 mins        2:19 PM, 08/23/19 Lonell Grandchild, MPT Physical Therapist with [facility] [phone]  [phone] office [phone] mobile phone

## 2019-08-23 NOTE — Telephone Encounter (Signed)
TRANSITIONAL CARE MANAGEMENT TELEPHONE OUTREACH NOTE   Contact Date: 08/23/2019 Contacted By: Zannie Cove, LPN    DISCHARGE INFORMATION Date of Discharge:08/23/19  Discharge Facility: [facility] Discharge Diagnosis:SOB    Outpatient Follow Up Recommendations (copied from discharge summary) Follow up with PCP ( rakes ) in 1 week   Amber Stephenson is a female primary care patient of Baruch Gouty, FNP. An outgoing telephone call was made today and I spoke with pt and her husband.  Amber Stephenson condition(s) and treatment(s) were discussed. An opportunity to ask questions was provided and all were answered or forwarded as appropriate.    ACTIVITIES OF DAILY LIVING  Amber Stephenson lives with their spouse and she can perform ADLs independently. her primary caregiver is herself and her husband. she is able to depend on her primary caregiver(s) for consistent help. Transportation to appointments, to pick up medications, and to run errands is a problem.  (Consider referral to [facility] if transportation or a consistent caregiver is a problem)   Fall Risk Fall Risk  08/22/2019 08/08/2019  Falls in the past year? 0 0  Number falls in past yr: - -  Comment - -  Injury with Fall? - -  Comment - -  Risk for fall due to : - -  Follow up - -    low [place] Modifications/Assistive Devices Wheelchair: No Cane: No Ramp: No Bedside Toilet: No Hospital Bed:  No    [place] she is not receiving home health n/a services.     MEDICATION RECONCILIATION  Amber Stephenson has been able to pick-up all prescribed discharge medications from the pharmacy.   A post discharge medication reconciliation was performed and the complete medication list was reviewed with the patient/caregiver and is current as of 08/23/2019. Changes highlighted below.  Discontinued Medications Robaxin   Current Medication List Allergies as of 08/23/2019      Reactions   Ivp  Dye [iodinated Diagnostic Agents] Anaphylaxis, Swelling   Throat closes   Latex    Latex tape pulls skin with it      Medication List       Accurate as of August 23, 2019  4:08 PM. If you have any questions, ask your nurse or doctor.        acetaminophen 325 MG tablet Commonly known as: TYLENOL Take 325-650 mg by mouth every 6 (six) hours as needed for mild pain, moderate pain or headache.   ARIPiprazole 10 MG tablet Commonly known as: ABILIFY Take 1 tablet (10 mg total) by mouth 2 (two) times daily.   atorvastatin 40 MG tablet Commonly known as: LIPITOR Take 1 tablet (40 mg total) by mouth daily.   budesonide-formoterol 80-4.5 MCG/ACT inhaler Commonly known as: Symbicort Inhale 2 puffs into the lungs 2 (two) times daily. What changed:   when to take this  reasons to take this   carbamazepine 100 MG 12 hr capsule Commonly known as: CARBATROL Take 1 capsule (100 mg total) by mouth daily.   diltiazem 120 MG 24 hr capsule Commonly known as: CARDIZEM CD TAKE 1 CAPSULE AT BEDTIME What changed: when to take this   Eliquis 5 MG Tabs tablet Generic drug: apixaban TAKE 1 TABLET TWICE A DAY What changed: how much to take   Eszopiclone 3 MG Tabs Commonly known as: eszopiclone Take 1 tablet (3 mg total) by mouth at bedtime as needed. Take immediately before bedtime What changed:  reasons to take this   furosemide 40 MG tablet Commonly known as: LASIX Take 1 tablet (40 mg total) by mouth every morning.   ibuprofen 800 MG tablet Commonly known as: ADVIL TAKE 1 TABLET BY MOUTH EVERY 8 HOURS AS NEEDED   insulin glargine 100 UNIT/ML Solostar Pen Commonly known as: Lantus SoloStar INJECT 60 UNITS UNDER THE SKIN TWICE A DAY What changed:   how much to take  how to take this  when to take this  additional instructions   LORazepam 1 MG tablet Commonly known as: Ativan Take 1-2 tablets (1-2 mg total) by mouth See admin instructions. Take 1 tab (1 mg) in the morning  and 2 tabs (2 mg ) at bedtime   metoprolol tartrate 100 MG tablet Commonly known as: LOPRESSOR Take 1 tablet (100 mg total) by mouth 2 (two) times daily. Take 132m + 22mfor a total of 12549mwice a day.   metoprolol tartrate 25 MG tablet Commonly known as: LOPRESSOR TAKE 1 TABLET TWICE A DAY (TO BE TAKEN ALONG WITH THE LOPRESSOR 100 MG TWICE A DAY)   Narcan 4 MG/0.1ML Liqd nasal spray kit Generic drug: naloxone Place 1 spray into the nose once as needed (opiod overdose).   oxyCODONE-acetaminophen 5-325 MG tablet Commonly known as: PERCOCET/ROXICET Take 1 tablet by mouth 4 (four) times daily as needed (pain.).   potassium chloride SA 20 MEQ tablet Commonly known as: KLOR-CON Take 1 tablet (20 mEq total) by mouth daily. What changed: when to take this   pregabalin 200 MG capsule Commonly known as: Lyrica Take 1 capsule (200 mg total) by mouth 2 (two) times daily.   ProAir HFA 108 (90 Base) MCG/ACT inhaler Generic drug: albuterol Inhale 1-2 puffs into the lungs every 6 (six) hours as needed. As needed   Rybelsus 7 MG Tabs Generic drug: Semaglutide Take 7 mg by mouth daily.   SUMAtriptan 100 MG tablet Commonly known as: IMITREX Take 1 tablet (100 mg total) by mouth every 2 (two) hours as needed for migraine. May repeat in 2 hours if headache persists or recurs.   topiramate 25 MG tablet Commonly known as: TOPAMAX Take 3 tablets every night What changed:   how much to take  how to take this  when to take this  additional instructions   traZODone 100 MG tablet Commonly known as: DESYREL Take 4 tablets (400 mg total) by mouth at bedtime.   triamcinolone ointment 0.5 % Commonly known as: KENALOG Apply 1 application topically 2 (two) times daily. What changed:   when to take this  reasons to take this   Vascepa 1 g capsule Generic drug: icosapent Ethyl TAKE 2 CAPSULES TWICE A DAY WITH MEALS What changed: See the new instructions.   Vitamin D3 25 MCG  (1000 UT) Caps Take 1,000 Units by mouth daily.        PATIENT EDUCATION & FOLLOW-UP PLAN  An appointment for Transitional Care Management is scheduled with RakBaruch GoutyNP on 08/29/19 at 10:20 am.  Take all medications as prescribed  Contact our office by calling [phone] you have any questions or concerns

## 2019-08-23 NOTE — Plan of Care (Signed)

## 2019-08-26 ENCOUNTER — Ambulatory Visit (INDEPENDENT_AMBULATORY_CARE_PROVIDER_SITE_OTHER): Payer: Medicare Other | Admitting: Neurology

## 2019-08-26 ENCOUNTER — Other Ambulatory Visit: Payer: Self-pay

## 2019-08-26 ENCOUNTER — Encounter: Payer: Self-pay | Admitting: Neurology

## 2019-08-26 VITALS — BP 111/75 | HR 75 | Ht 62.0 in | Wt 198.6 lb

## 2019-08-26 DIAGNOSIS — F0150 Vascular dementia without behavioral disturbance: Secondary | ICD-10-CM

## 2019-08-26 DIAGNOSIS — G43109 Migraine with aura, not intractable, without status migrainosus: Secondary | ICD-10-CM | POA: Diagnosis not present

## 2019-08-26 DIAGNOSIS — F01A0 Vascular dementia, mild, without behavioral disturbance, psychotic disturbance, mood disturbance, and anxiety: Secondary | ICD-10-CM

## 2019-08-26 MED ORDER — TOPIRAMATE 25 MG PO TABS: ORAL_TABLET | ORAL | 11 refills | Status: DC

## 2019-08-26 NOTE — Patient Instructions (Signed)
Good to see you! Continue Topiramat 25mg : take 1 tablet in AM, 3 tablets in PM. Continue control of BP, cholesterol, diabetes. Continue working with your psychiatrist and therapist. Follow-up in 1 year, call for any changes

## 2019-08-26 NOTE — Progress Notes (Signed)
NEUROLOGY FOLLOW UP OFFICE NOTE  DENVER FELTZ ME:[id number] August 28, 1945  HISTORY OF PRESENT ILLNESS: I had the pleasure of seeing Amber Stephenson in follow-up in the neurology clinic on 08/26/2019.  The patient was last seen 7 months ago for memory loss. She is again accompanied by her husband who helps supplement the history today.  Records and images were personally reviewed where available.  Since her last visit, she underwent Neuropsychological testing in 04/2019 which showed primary areas of impairment with processing speed and attention/concentration. 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 She also scored in the mild range of anxiety and depression, and it was felt that the combination of these with daily headaches, dizziness, sleep apnea, and chronic pain, negatively influence day to day cognitive function. She feels she is doing okay, her husband feels she is doing good. Her husband fixes her pillbox and she takes them independently without issues. Her husband manages finances. She does not drive. She reports migraines are controlled with Topiramate 25mg  in AM, 75mg  in PM, no side effects. She is recovering well from right shoulder surgery. Sleep is good.   History on Initial Assessment 07/17/2017: This is a 74 year old right-handed woman with a history of hypertension, hyperlipidemia, diabetes, atrial fibrillation, chronic pain, bipolar disorder, presenting for evaluation of memory changes. She feels her memory has gotten really bad over the past 2 months. She cannot remember words/word-finding difficulties. She "does not think it's Alzheimers" but feels like she is not paying attention. Family reminds her that they had told her something already previously. Her husband has to remind her of her medications. They put her pills together in a pillbox. Her husband is in charge of finances. She stopped  driving 4 years ago, she denied getting lost when she used to drive. She started noticing memory changes after she was started on a medication after she had her colonoscopy. She has also been dealing with a lot of pain, she states her Percocet dose was cut in half a month ago and this is causing her a lot of issues. She has poor sleep, 3 hours at the most. She was recently started on Trazodone by her psychiatrist, which may be helping. Her father had Alzheimer's disease. She denies any history of significant head injuries, no alcohol use.  She has frequent headaches, around 20 headache days a month with stabbing pain in the frontal region that can last up to 3 days, with associated nausea/vomiting. If she takes medication, pain lasts up to 2 hours. She has dizziness at least 1-2 times a day where it feels like the room is spinning. She has had falls for "no clear reason," last fall was 3 months ago. She has chronic neck and back pain, no focal numbness/tingling/weakness. She has tremors in both hands and has a diagnosis of essential tremor, it is "very difficult to do things," it has affected her handwriting, computer use. No family history of tremors. No bowel/bladder dysfunction or anosmia.   I personally reviewed MRI brain without contrast done 05/13/17 which did not show any acute changes. There was mild diffuse atrophy and mild to moderate chronic microvascular disease.  PAST MEDICAL HISTORY: Past Medical History:  Diagnosis Date  . Atrial fibrillation with RVR 05/19/2017  . Bipolar I disorder 01/23/2015  . CAD (coronary artery disease)    Nonobstructive; Managed by Dr. Bronson Ing  . Cardiomegaly 01/12/2018  . Chronic anticoagulation 05/30/2015  .  Chronic back pain    Lower back  . Chronic diastolic CHF (congestive heart failure) 05/30/2015  . Diabetes mellitus, type 2, without complication   . Diabetic neuropathy 02/06/2016  . Dysrhythmia    A-Fib  . Essential hypertension   . Herpes  genitalis in women 07/16/2015  . Hyperlipidemia   . Hypertension   . Hypoxia 02/01/2019  . Insomnia 01/23/2015  . Lipoma 02/08/2015  . Major depressive disorder   . Migraine headache with aura 02/12/2016  . 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[name]) 04/21/2019  . NAFL (nonalcoholic fatty liver) [id number]  . OSA (obstructive sleep apnea) 02/24/2019   10/09/2018 - HST  - AHI 40.6   . Pulmonary hypertension   . Renal insufficiency    Managed by Dr. Wallace Keller  . RLS (restless legs syndrome) 04/27/2015     MEDICATIONS: Current Outpatient Medications on File Prior to Visit  Medication Sig Dispense Refill  . acetaminophen (TYLENOL) 325 MG tablet Take 325-650 mg by mouth every 6 (six) hours as needed for mild pain, moderate pain or headache.    . ARIPiprazole (ABILIFY) 10 MG tablet Take 1 tablet (10 mg total) by mouth 2 (two) times daily. (Patient taking differently: Take 10 mg by mouth once. ) 90 tablet 5  . atorvastatin (LIPITOR) 40 MG tablet Take 1 tablet (40 mg total) by mouth daily. 90 tablet 3  . budesonide-formoterol (SYMBICORT) 80-4.5 MCG/ACT inhaler Inhale 2 puffs into the lungs 2 (two) times daily. (Patient taking differently: Inhale 2 puffs into the lungs 2 (two) times daily as needed (shortness of breath). ) 2 Inhaler 0  . carbamazepine (CARBATROL) 100 MG 12 hr capsule Take 1 capsule (100 mg total) by mouth daily. 90 capsule 1  . Cholecalciferol (VITAMIN D3) 1000 UNITS CAPS Take 1,000 Units by mouth daily.    Marland Kitchen diltiazem (CARDIZEM CD) 120 MG 24 hr capsule TAKE 1 CAPSULE AT BEDTIME (Patient taking differently: Take 120 mg by mouth daily. ) 90 capsule 3  . ELIQUIS 5 MG TABS tablet TAKE 1 TABLET TWICE A DAY (Patient taking differently: Take 5 mg by mouth 2 (two) times daily. ) 180 tablet 3  . Eszopiclone (ESZOPICLONE) 3 MG TABS Take 1 tablet (3 mg total) by mouth at bedtime as needed. Take immediately before bedtime (Patient taking differently: Take 3 mg by mouth at bedtime as needed (sleep). Take  immediately before bedtime) 90 tablet 1  . furosemide (LASIX) 40 MG tablet Take 1 tablet (40 mg total) by mouth every morning. 90 tablet 1  . ibuprofen (ADVIL) 800 MG tablet TAKE 1 TABLET BY MOUTH EVERY 8 HOURS AS NEEDED 30 tablet 1  . Insulin Glargine (LANTUS SOLOSTAR) 100 UNIT/ML Solostar Pen INJECT 60 UNITS UNDER THE SKIN TWICE A DAY (Patient taking differently: Inject 60 Units into the skin 2 (two) times daily. ) 90 mL 0  . LORazepam (ATIVAN) 1 MG tablet Take 1-2 tablets (1-2 mg total) by mouth See admin instructions. Take 1 tab (1 mg) in the morning and 2 tabs (2 mg ) at bedtime 90 tablet 4  . metoprolol tartrate (LOPRESSOR) 100 MG tablet Take 1 tablet (100 mg total) by mouth 2 (two) times daily. Take 100mg  + 25mg  for a total of 125mg  twice a day.    . metoprolol tartrate (LOPRESSOR) 25 MG tablet TAKE 1 TABLET TWICE A DAY (TO BE TAKEN ALONG WITH THE LOPRESSOR 100 MG TWICE A DAY) 180 tablet 1  . naloxone (NARCAN) nasal spray 4 mg/0.1 mL Place 1 spray  into the nose once as needed (opiod overdose).    Marland Kitchen oxyCODONE-acetaminophen (PERCOCET/ROXICET) 5-325 MG tablet Take 1 tablet by mouth 4 (four) times daily as needed (pain.). 30 tablet 0  . potassium chloride SA (KLOR-CON) 20 MEQ tablet Take 1 tablet (20 mEq total) by mouth daily. (Patient taking differently: Take 20 mEq by mouth every evening. ) 90 tablet 3  . pregabalin (LYRICA) 200 MG capsule Take 1 capsule (200 mg total) by mouth 2 (two) times daily. 60 capsule 3  . PROAIR HFA 108 (90 Base) MCG/ACT inhaler Inhale 1-2 puffs into the lungs every 6 (six) hours as needed. As needed    . Semaglutide (RYBELSUS) 7 MG TABS Take 7 mg by mouth daily.     . SUMAtriptan (IMITREX) 100 MG tablet Take 1 tablet (100 mg total) by mouth every 2 (two) hours as needed for migraine. May repeat in 2 hours if headache persists or recurs. 10 tablet 5  . topiramate (TOPAMAX) 25 MG tablet Take 3 tablets every night (Patient taking differently: Take 75 mg by mouth at bedtime.  ) 90 tablet 11  . traZODone (DESYREL) 100 MG tablet Take 4 tablets (400 mg total) by mouth at bedtime. 360 tablet 1  . triamcinolone ointment (KENALOG) 0.5 % Apply 1 application topically 2 (two) times daily. (Patient taking differently: Apply 1 application topically 2 (two) times daily as needed (skin irritation/rash.). ) 30 g 0  . VASCEPA 1 g CAPS TAKE 2 CAPSULES TWICE A DAY WITH MEALS (Patient taking differently: Take 2 g by mouth in the morning and at bedtime. ) 360 capsule 3   ALLERGIES: Allergies  Allergen Reactions  . Ivp Dye [Iodinated Diagnostic Agents] Anaphylaxis and Swelling    Throat closes  . Latex     Latex tape pulls skin with it    FAMILY HISTORY: Family History  Problem Relation Age of Onset  . Diabetes Mother   . Heart disease Mother   . Heart disease Brother   . Heart disease Sister        CABG  . Alcohol abuse Sister   . Mental illness Brother   . Diabetes Brother     SOCIAL HISTORY: Social History   Socioeconomic History  . Marital status: Married    Spouse name: Orpah Greek  . Number of children: 3  . Years of education: 13  . Highest education level: Associate degree: academic program  Occupational History  . Occupation: Retired  Tobacco Use  . Smoking status: Never Smoker  . Smokeless tobacco: Never Used  Substance and Sexual Activity  . Alcohol use: Yes    Alcohol/week: 0.0 standard drinks    Comment: once a week  . Drug use: No  . Sexual activity: Yes    Partners: Male    Birth control/protection: Post-menopausal  Other Topics Concern  . Not on file  Social History Narrative   Lives at home with husband.       Right handed   Social Determinants of Health   Financial Resource Strain:   . Difficulty of Paying Living Expenses:   Food Insecurity:   . Worried About Charity fundraiser in the Last Year:   . Arboriculturist in the Last Year:   Transportation Needs:   . Film/video editor (Medical):   Marland Kitchen Lack of Transportation  (Non-Medical):   Physical Activity:   . Days of Exercise per Week:   . Minutes of Exercise per Session:   Stress:   .  Feeling of Stress :   Social Connections:   . Frequency of Communication with Friends and Family:   . Frequency of Social Gatherings with Friends and Family:   . Attends Religious Services:   . Active Member of Clubs or Organizations:   . Attends Archivist Meetings:   Marland Kitchen Marital Status:   Intimate Partner Violence:   . Fear of Current or Ex-Partner:   . Emotionally Abused:   Marland Kitchen Physically Abused:   . Sexually Abused:     REVIEW OF SYSTEMS: Constitutional: No fevers, chills, or sweats, no generalized fatigue, change in appetite Eyes: No visual changes, double vision, eye pain Ear, nose and throat: No hearing loss, ear pain, nasal congestion, sore throat Cardiovascular: No chest pain, palpitations Respiratory:  No shortness of breath at rest or with exertion, wheezes GastrointestinaI: No nausea, vomiting, diarrhea, abdominal pain, fecal incontinence Genitourinary:  No dysuria, urinary retention or frequency Musculoskeletal:  No neck pain, back pain Integumentary: No rash, pruritus, skin lesions Neurological: as above Psychiatric: + depression, anxiety Endocrine: No palpitations, fatigue, diaphoresis, mood swings, change in appetite, change in weight, increased thirst Hematologic/Lymphatic:  No anemia, purpura, petechiae. Allergic/Immunologic: no itchy/runny eyes, nasal congestion, recent allergic reactions, rashes  PHYSICAL EXAM: Vitals:   08/26/19 1512  BP: 111/75  Pulse: 75  SpO2: 95%   General: No acute distress Head:  Normocephalic/atraumatic Skin/Extremities: No rash, no edema Neurological Exam: alert and oriented to person, place, and time. No aphasia or dysarthria. Fund of knowledge is appropriate.  Recent and remote memory are intact.  Attention and concentration are normal. Cranial nerves: Pupils equal, round, reactive to light.  Extraocular movements intact with no nystagmus. Visual fields full. No facial asymmetry. Motor: Bulk and tone normal, muscle strength 5/5 throughout except for right arm in sling. Finger to nose testing intact on left. Gait narrow-based and steady, no ataxia.  IMPRESSION: This is a 74 yo RH woman with a history of hypertension, hyperlipidemia, diabetes, atrial fibrillation, chronic pain, bipolar disorder, who presented for memory changes. Neurocognitive testing indicated Mild Vascular Neurocognitive disorder (ie MCI). Continue control of vascular risk factors. Migraines controlled with Topiramate 25mg  in AM, 75mg  in PM. We also discussed how mood can affect memory, continue working with [employer]. Follow-up in 6 months, they know to call for any changes.    Thank you for allowing me to participate in her care.  Please do not hesitate to call for any questions or concerns.   Ellouise Newer, M.D.   CC: Dr. Lajuana Ripple

## 2019-08-29 ENCOUNTER — Encounter (HOSPITAL_BASED_OUTPATIENT_CLINIC_OR_DEPARTMENT_OTHER): Payer: Medicare Other | Admitting: Physical Medicine and Rehabilitation

## 2019-08-29 ENCOUNTER — Encounter: Payer: Self-pay | Admitting: Physical Medicine and Rehabilitation

## 2019-08-29 ENCOUNTER — Encounter: Payer: Self-pay | Admitting: Family Medicine

## 2019-08-29 ENCOUNTER — Ambulatory Visit (INDEPENDENT_AMBULATORY_CARE_PROVIDER_SITE_OTHER): Payer: Medicare Other | Admitting: Family Medicine

## 2019-08-29 ENCOUNTER — Other Ambulatory Visit: Payer: Self-pay

## 2019-08-29 VITALS — BP 133/76 | HR 65 | Temp 97.7°F | Ht 62.0 in | Wt 197.0 lb

## 2019-08-29 VITALS — BP 104/80 | HR 69 | Ht 62.0 in | Wt 198.4 lb

## 2019-08-29 DIAGNOSIS — G43109 Migraine with aura, not intractable, without status migrainosus: Secondary | ICD-10-CM | POA: Diagnosis not present

## 2019-08-29 DIAGNOSIS — E1169 Type 2 diabetes mellitus with other specified complication: Secondary | ICD-10-CM | POA: Diagnosis not present

## 2019-08-29 DIAGNOSIS — I10 Essential (primary) hypertension: Secondary | ICD-10-CM

## 2019-08-29 DIAGNOSIS — M797 Fibromyalgia: Secondary | ICD-10-CM | POA: Diagnosis not present

## 2019-08-29 DIAGNOSIS — D72829 Elevated white blood cell count, unspecified: Secondary | ICD-10-CM

## 2019-08-29 DIAGNOSIS — E1159 Type 2 diabetes mellitus with other circulatory complications: Secondary | ICD-10-CM

## 2019-08-29 DIAGNOSIS — R0600 Dyspnea, unspecified: Secondary | ICD-10-CM | POA: Diagnosis not present

## 2019-08-29 DIAGNOSIS — M7918 Myalgia, other site: Secondary | ICD-10-CM

## 2019-08-29 DIAGNOSIS — E785 Hyperlipidemia, unspecified: Secondary | ICD-10-CM

## 2019-08-29 DIAGNOSIS — R350 Frequency of micturition: Secondary | ICD-10-CM | POA: Diagnosis not present

## 2019-08-29 DIAGNOSIS — Z794 Long term (current) use of insulin: Secondary | ICD-10-CM | POA: Diagnosis not present

## 2019-08-29 DIAGNOSIS — Z09 Encounter for follow-up examination after completed treatment for conditions other than malignant neoplasm: Secondary | ICD-10-CM

## 2019-08-29 DIAGNOSIS — G894 Chronic pain syndrome: Secondary | ICD-10-CM

## 2019-08-29 DIAGNOSIS — Z79899 Other long term (current) drug therapy: Secondary | ICD-10-CM | POA: Diagnosis not present

## 2019-08-29 DIAGNOSIS — I5032 Chronic diastolic (congestive) heart failure: Secondary | ICD-10-CM | POA: Diagnosis not present

## 2019-08-29 DIAGNOSIS — R0609 Other forms of dyspnea: Secondary | ICD-10-CM

## 2019-08-29 DIAGNOSIS — M792 Neuralgia and neuritis, unspecified: Secondary | ICD-10-CM | POA: Diagnosis not present

## 2019-08-29 DIAGNOSIS — I152 Hypertension secondary to endocrine disorders: Secondary | ICD-10-CM

## 2019-08-29 LAB — URINALYSIS, COMPLETE
Bilirubin, UA: NEGATIVE
Glucose, UA: NEGATIVE
Ketones, UA: NEGATIVE
Leukocytes,UA: NEGATIVE
Nitrite, UA: NEGATIVE
Protein,UA: NEGATIVE
RBC, UA: NEGATIVE
Specific Gravity, UA: 1.01 (ref 1.005–1.030)
Urobilinogen, Ur: 0.2 mg/dL (ref 0.2–1.0)
pH, UA: 5 (ref 5.0–7.5)

## 2019-08-29 LAB — MICROSCOPIC EXAMINATION
RBC, Urine: NONE SEEN /hpf (ref 0–2)
Renal Epithel, UA: NONE SEEN /hpf

## 2019-08-29 LAB — BAYER DCA HB A1C WAIVED: HB A1C (BAYER DCA - WAIVED): 7.1 % — ABNORMAL HIGH (ref <7.0)

## 2019-08-29 MED ORDER — ALBUTEROL SULFATE (2.5 MG/3ML) 0.083% IN NEBU: 2.5000 mg | INHALATION_SOLUTION | Freq: Four times a day (QID) | RESPIRATORY_TRACT | 1 refills | Status: DC | PRN

## 2019-08-29 MED ORDER — PROAIR HFA 108 (90 BASE) MCG/ACT IN AERS: 1.0000 | INHALATION_SPRAY | Freq: Four times a day (QID) | RESPIRATORY_TRACT | 11 refills | Status: DC | PRN

## 2019-08-29 MED ORDER — METAXALONE 800 MG PO TABS: 800.0000 mg | ORAL_TABLET | Freq: Three times a day (TID) | ORAL | 3 refills | Status: DC | PRN

## 2019-08-29 MED ORDER — TOPIRAMATE 25 MG PO TABS: ORAL_TABLET | ORAL | 11 refills | Status: DC

## 2019-08-29 MED ORDER — BUDESONIDE-FORMOTEROL FUMARATE 80-4.5 MCG/ACT IN AERO: 2.0000 | INHALATION_SPRAY | Freq: Two times a day (BID) | RESPIRATORY_TRACT | 5 refills | Status: DC

## 2019-08-29 NOTE — Progress Notes (Signed)
     Had a reaction after taking Robaxin.   After first dose. Ended up in ER due to breathing problems.     Patient here for trigger point injections for large trigger point, myofascial pain  Consent done and on chart.  Cleaned areas with alcohol and injected using a 27 gauge 1.5 inch needle  Injected 4.5 cc Using 1% Lidocaine with no EPI  Upper traps Levators Posterior scalenes Middle scalenes Splenius Capitus Pectoralis Major Rhomboids Infraspinatus Teres Major/minor Thoracic paraspinals Lumbar paraspinals- R lumbar paraspinals- over trigger points x6 injections Other injections-      There was no bleeding or complications. A little bleeding from blood thinners  Patient was advised to drink a lot of water on day after injections to flush system Will have increased soreness for 12-48 hours after injections.  Can use Lidocaine patches the day AFTER injections Can use theracane on day of injections in places didn't inject Can use heating pad 4-6 hours AFTER injections   2. Skelaxin- ALLERGIC to Robaxin- at this time- ended up in ER due to 1 dose of Robaxin- and also sedated on Flexeril, so need to try Skelaxin- has failed 2 Muscle relaxants so far-  Patient's level of pain prior was 8/10 Current level of pain after injections is 4/10  3. F/U 5-6 weeks- already scheduled.

## 2019-08-29 NOTE — Patient Instructions (Signed)
Patient here for trigger point injections for large trigger point, myofascial pain  Consent done and on chart.  Cleaned areas with alcohol and injected using a 27 gauge 1.5 inch needle  Injected 4.5 cc Using 1% Lidocaine with no EPI  Upper traps Levators Posterior scalenes Middle scalenes Splenius Capitus Pectoralis Major Rhomboids Infraspinatus Teres Major/minor Thoracic paraspinals Lumbar paraspinals- R lumbar paraspinals- over trigger points x6 injections Other injections-      There was no bleeding or complications. A little bleeding from blood thinners  Patient was advised to drink a lot of water on day after injections to flush system Will have increased soreness for 12-48 hours after injections.  Can use Lidocaine patches the day AFTER injections Can use theracane on day of injections in places didn't inject Can use heating pad 4-6 hours AFTER injections   2. Skelaxin- ALLERGIC to Robaxin- at this time- ended up in ER due to 1 dose of Robaxin- and also sedated on Flexeril, so need to try Skelaxin- has failed 2 Muscle relaxants so far-  Patient's level of pain prior was 8/10 Current level of pain after injections is 4/10  3. F/U 5-6 weeks- already scheduled.

## 2019-08-29 NOTE — Patient Instructions (Signed)
Continue to monitor your blood sugars as we discussed and record them. Bring the log to your next appointment.  Take your medications as directed.    Goal Blood glucose:    Fasting (before meals) = 80 to 130   Within 2 hours of eating = less than 180   Understanding your Hemoglobin A1c:     Diabetes Mellitus and Nutrition    I think that you would greatly benefit from seeing a nutritionist. If this is something you are interested in, please call Dr Sykes at [phone] to schedule an appointment.   When you have diabetes (diabetes mellitus), it is very important to have healthy eating habits because your blood sugar (glucose) levels are greatly affected by what you eat and drink. Eating healthy foods in the appropriate amounts, at about the same times every day, can help you:  Control your blood glucose.  Lower your risk of heart disease.  Improve your blood pressure.  Reach or maintain a healthy weight.  Every person with diabetes is different, and each person has different needs for a meal plan. Your health care provider may recommend that you work with a diet and nutrition specialist (dietitian) to make a meal plan that is best for you. Your meal plan may vary depending on factors such as:  The calories you need.  The medicines you take.  Your weight.  Your blood glucose, blood pressure, and cholesterol levels.  Your activity level.  Other health conditions you have, such as heart or kidney disease.  How do carbohydrates affect me? Carbohydrates affect your blood glucose level more than any other type of food. Eating carbohydrates naturally increases the amount of glucose in your blood. Carbohydrate counting is a method for keeping track of how many carbohydrates you eat. Counting carbohydrates is important to keep your blood glucose at a healthy level, especially if you use insulin or take certain oral diabetes medicines. It is important to know how many  carbohydrates you can safely have in each meal. This is different for every person. Your dietitian can help you calculate how many carbohydrates you should have at each meal and for snack. Foods that contain carbohydrates include:  Bread, cereal, rice, pasta, and crackers.  Potatoes and corn.  Peas, beans, and lentils.  Milk and yogurt.  Fruit and juice.  Desserts, such as cakes, cookies, ice cream, and candy.  How does alcohol affect me? Alcohol can cause a sudden decrease in blood glucose (hypoglycemia), especially if you use insulin or take certain oral diabetes medicines. Hypoglycemia can be a life-threatening condition. Symptoms of hypoglycemia (sleepiness, dizziness, and confusion) are similar to symptoms of having too much alcohol. If your health care provider says that alcohol is safe for you, follow these guidelines:  Limit alcohol intake to no more than 1 drink per day for nonpregnant women and 2 drinks per day for men. One drink equals 12 oz of beer, 5 oz of wine, or 1 oz of hard liquor.  Do not drink on an empty stomach.  Keep yourself hydrated with water, diet soda, or unsweetened iced tea.  Keep in mind that regular soda, juice, and other mixers may contain a lot of sugar and must be counted as carbohydrates.  What are tips for following this plan?  Reading food labels  Start by checking the serving size on the label. The amount of calories, carbohydrates, fats, and other nutrients listed on the label are based on one serving of the food. Many foods   contain more than one serving per package.  Check the total grams (g) of carbohydrates in one serving. You can calculate the number of servings of carbohydrates in one serving by dividing the total carbohydrates by 15. For example, if a food has 30 g of total carbohydrates, it would be equal to 2 servings of carbohydrates.  Check the number of grams (g) of saturated and trans fats in one serving. Choose foods that have  low or no amount of these fats.  Check the number of milligrams (mg) of sodium in one serving. Most people should limit total sodium intake to less than 2,300 mg per day.  Always check the nutrition information of foods labeled as "low-fat" or "nonfat". These foods may be higher in added sugar or refined carbohydrates and should be avoided.  Talk to your dietitian to identify your daily goals for nutrients listed on the label.  Shopping  Avoid buying canned, premade, or processed foods. These foods tend to be high in fat, sodium, and added sugar.  Shop around the outside edge of the grocery store. This includes fresh fruits and vegetables, bulk grains, fresh meats, and fresh dairy.  Cooking  Use low-heat cooking methods, such as baking, instead of high-heat cooking methods like deep frying.  Cook using healthy oils, such as olive, canola, or sunflower oil.  Avoid cooking with butter, cream, or high-fat meats.  Meal planning  Eat meals and snacks regularly, preferably at the same times every day. Avoid going long periods of time without eating.  Eat foods high in fiber, such as fresh fruits, vegetables, beans, and whole grains. Talk to your dietitian about how many servings of carbohydrates you can eat at each meal.  Eat 4-6 ounces of lean protein each day, such as lean meat, chicken, fish, eggs, or tofu. 1 ounce is equal to 1 ounce of meat, chicken, or fish, 1 egg, or 1/4 cup of tofu.  Eat some foods each day that contain healthy fats, such as avocado, nuts, seeds, and fish.  Lifestyle   Check your blood glucose regularly.  Exercise at least 30 minutes 5 or more days each week, or as told by your health care provider.  Take medicines as told by your health care provider.  Do not use any products that contain nicotine or tobacco, such as cigarettes and e-cigarettes. If you need help quitting, ask your health care provider.  Work with a counselor or diabetes educator to  identify strategies to manage stress and any emotional and social challenges.   What are some questions to ask my health care provider?  Do I need to meet with a diabetes educator?  Do I need to meet with a dietitian?  What number can I call if I have questions?  When are the best times to check my blood glucose?   Where to find more information:  American Diabetes Association: diabetes.org/food-and-fitness/food  Academy of Nutrition and Dietetics: www.eatright.org/resources/health/diseases-and-conditions/diabetes  National Institute of Diabetes and Digestive and Kidney Diseases (NIH): www.niddk.nih.gov/health-information/diabetes/overview/diet-eating-physical-activity   Summary  A healthy meal plan will help you control your blood glucose and maintain a healthy lifestyle.  Working with a diet and nutrition specialist (dietitian) can help you make a meal plan that is best for you.  Keep in mind that carbohydrates and alcohol have immediate effects on your blood glucose levels. It is important to count carbohydrates and to use alcohol carefully. This information is not intended to replace advice given to you by your health care provider.   Make sure you discuss any questions you have with your health care provider. Document Released: 02/13/2005 Document Revised: 06/23/2016 Document Reviewed: 06/23/2016 Elsevier Interactive Patient Education  2018 [employer]     

## 2019-08-29 NOTE — Progress Notes (Signed)
Subjective:  Patient ID: Amber Stephenson, female    DOB: 1945-11-01, 74 y.o.   MRN: [id number]  Patient Care Team: [name], DO as PCP - General (Family Medicine) Herminio Commons, MD as PCP - Cardiology (Cardiology) Weber Cooks, MD as Referring Physician (Orthopedic Surgery) Norma Fredrickson, MD as Consulting Physician (Psychiatry) Susette Racer, MD (Endocrinology) Cameron Sprang, MD as Consulting Physician (Neurology)   Chief Complaint:  Hospitalization Follow-up (SOB)   HPI: Amber Stephenson is a 74 y.o. female presenting on 08/29/2019 for Hospitalization Follow-up (SOB)  Today's visit is for Transitional Care Management.  The patient was discharged from [facility] on 08/23/2019 with a primary diagnosis of dyspnea.   Contact with the patient and/or caregiver, by a clinical staff member, was made on 08/23/2019 and was documented as a telephone encounter within the EMR.  Through chart review and discussion with the patient I have determined that management of their condition is of high complexity.   Pt was admitted to [facility] for observation of dyspnea on 08/22/2019. She had shoulder surgery on 08/19/2019. She became short of breath on 08/22/2019 and went to the ED for evaluation. She was evaluated for a PE which was ruled out. She was watched over night and improved so she was discharged home. She reports she feels better since being home. States she still has some slight dyspnea with exertion. That is relieved by rest. She was taken off of Robaxin and this was new and thought to be cause of dyspnea. No PND or orthopnea.   She was seen by neurology on 08/26/2019 for memory changes. Dr. Delice Lesch has scheduled pt for further neurocognitive testing and decreased her dose of Topamax to 50 mg nightly. Pt reports she is still taking 75 mg nightly. She reports continued brain fog and memory impairment. States she has a hard time getting her words out or remembering what she is  trying to say. She is alone in office today but husband has been helping to manage her care and medications.   Pt is past due for follow up of chronic medical conditions. States she has not been checking her blood sugars since being home from the hospital but is taking her medications as prescribed. No polyuria, polyphagia, or polydipsia. States she does have urinary frequency at night. Denies dysuria or urgency.  Has not been checking blood pressure. Denies headache, chest pain, weakness, palpitations, or syncope. Compliant with medications but not diet and exercise. Has been taking statin as prescribed. No associated symptoms.   Relevant past medical, surgical, family, and social history reviewed and updated as indicated.  Allergies and medications reviewed and updated. Date reviewed: Chart in Epic.   Past Medical History:  Diagnosis Date  . Atrial fibrillation with RVR 05/19/2017  . Bipolar I disorder 01/23/2015  . CAD (coronary artery disease)    Nonobstructive; Managed by Dr. Bronson Ing  . Cardiomegaly 01/12/2018  . Chronic anticoagulation 05/30/2015  . Chronic back pain    Lower back  . Chronic diastolic CHF (congestive heart failure) 05/30/2015  . Diabetes mellitus, type 2, without complication   . Diabetic neuropathy 02/06/2016  . Dysrhythmia    A-Fib  . Essential hypertension   . Herpes genitalis in women 07/16/2015  . Hyperlipidemia   . Hypertension   . Hypoxia 02/01/2019  . Insomnia 01/23/2015  . Lipoma 02/08/2015  . Major depressive disorder   . Migraine headache with aura 02/12/2016  . 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[place])  04/21/2019  . NAFL (nonalcoholic fatty liver) 12/19/9468  . OSA (obstructive sleep apnea) 02/24/2019   10/09/2018 - HST  - AHI 40.6   . Pulmonary hypertension   . Renal insufficiency    Managed by Dr. Wallace Keller  . RLS (restless legs syndrome) 04/27/2015    Past Surgical History:  Procedure Laterality Date  . BREAST REDUCTION SURGERY    . EYE SURGERY  Right    cateracts  . HAMMER TOE SURGERY    . LEFT HEART CATH AND CORONARY ANGIOGRAPHY N/A 02/03/2018   Procedure: LEFT HEART CATH AND CORONARY ANGIOGRAPHY;  Surgeon: Martinique, Peter M, MD;  Location: [facility];  Service: Cardiovascular;  Laterality: N/A;  . REVERSE SHOULDER ARTHROPLASTY Right 08/19/2019   Procedure: REVERSE SHOULDER ARTHROPLASTY;  Surgeon: Netta Cedars, MD;  Location: [facility];  Service: Orthopedics;  Laterality: Right;  interscalene block  . SHOULDER SURGERY Right    "I BROKE MY SHOUDLER  . THIGH SURGERY     "TO REMOVE A TUMOR "    Social History   Socioeconomic History  . Marital status: Married    Spouse name: Orpah Greek  . Number of children: 3  . Years of education: 53  . Highest education level: Associate degree: academic program  Occupational History  . Occupation: Retired  Tobacco Use  . Smoking status: Never Smoker  . Smokeless tobacco: Never Used  Substance and Sexual Activity  . Alcohol use: Yes    Alcohol/week: 0.0 standard drinks    Comment: once a week  . Drug use: No  . Sexual activity: Yes    Partners: Male    Birth control/protection: Post-menopausal  Other Topics Concern  . Not on file  Social History Narrative   Lives at home with husband.       Right handed   Social Determinants of Health   Financial Resource Strain:   . Difficulty of Paying Living Expenses:   Food Insecurity:   . Worried About Charity fundraiser in the Last Year:   . Arboriculturist in the Last Year:   Transportation Needs:   . Film/video editor (Medical):   Marland Kitchen Lack of Transportation (Non-Medical):   Physical Activity:   . Days of Exercise per Week:   . Minutes of Exercise per Session:   Stress:   . Feeling of Stress :   Social Connections:   . Frequency of Communication with Friends and Family:   . Frequency of Social Gatherings with Friends and Family:   . Attends Religious Services:   . Active Member of Clubs or Organizations:   . Attends English as a second language teacher Meetings:   Marland Kitchen Marital Status:   Intimate Partner Violence:   . Fear of Current or Ex-Partner:   . Emotionally Abused:   Marland Kitchen Physically Abused:   . Sexually Abused:     Outpatient Encounter Medications as of 08/29/2019  Medication Sig  . acetaminophen (TYLENOL) 325 MG tablet Take 325-650 mg by mouth every 6 (six) hours as needed for mild pain, moderate pain or headache.  . ARIPiprazole (ABILIFY) 10 MG tablet Take 1 tablet (10 mg total) by mouth 2 (two) times daily. (Patient taking differently: Take 10 mg by mouth once. )  . atorvastatin (LIPITOR) 40 MG tablet Take 1 tablet (40 mg total) by mouth daily.  . budesonide-formoterol (SYMBICORT) 80-4.5 MCG/ACT inhaler Inhale 2 puffs into the lungs 2 (two) times daily.  . carbamazepine (CARBATROL) 100 MG 12 hr capsule Take 1 capsule (  100 mg total) by mouth daily.  . Cholecalciferol (VITAMIN D3) 1000 UNITS CAPS Take 1,000 Units by mouth daily.  Marland Kitchen diltiazem (CARDIZEM CD) 120 MG 24 hr capsule TAKE 1 CAPSULE AT BEDTIME (Patient taking differently: Take 120 mg by mouth daily. )  . ELIQUIS 5 MG TABS tablet TAKE 1 TABLET TWICE A DAY (Patient taking differently: Take 5 mg by mouth 2 (two) times daily. )  . Eszopiclone (ESZOPICLONE) 3 MG TABS Take 1 tablet (3 mg total) by mouth at bedtime as needed. Take immediately before bedtime (Patient taking differently: Take 3 mg by mouth at bedtime as needed (sleep). Take immediately before bedtime)  . furosemide (LASIX) 40 MG tablet Take 1 tablet (40 mg total) by mouth every morning.  Marland Kitchen ibuprofen (ADVIL) 800 MG tablet TAKE 1 TABLET BY MOUTH EVERY 8 HOURS AS NEEDED  . Insulin Glargine (LANTUS SOLOSTAR) 100 UNIT/ML Solostar Pen INJECT 60 UNITS UNDER THE SKIN TWICE A DAY (Patient taking differently: Inject 60 Units into the skin 2 (two) times daily. )  . LORazepam (ATIVAN) 1 MG tablet Take 1-2 tablets (1-2 mg total) by mouth See admin instructions. Take 1 tab (1 mg) in the morning and 2 tabs (2 mg ) at bedtime   . metoprolol tartrate (LOPRESSOR) 100 MG tablet Take 1 tablet (100 mg total) by mouth 2 (two) times daily. Take 125m + 249mfor a total of 12557mwice a day.  . metoprolol tartrate (LOPRESSOR) 25 MG tablet TAKE 1 TABLET TWICE A DAY (TO BE TAKEN ALONG WITH THE LOPRESSOR 100 MG TWICE A DAY)  . naloxone (NARCAN) nasal spray 4 mg/0.1 mL Place 1 spray into the nose once as needed (opiod overdose).  . oMarland KitchenyCODONE-acetaminophen (PERCOCET/ROXICET) 5-325 MG tablet Take 1 tablet by mouth 4 (four) times daily as needed (pain.).  . pMarland Kitchentassium chloride SA (KLOR-CON) 20 MEQ tablet Take 1 tablet (20 mEq total) by mouth daily. (Patient taking differently: Take 20 mEq by mouth every evening. )  . pregabalin (LYRICA) 200 MG capsule Take 1 capsule (200 mg total) by mouth 2 (two) times daily.  . PMarland KitchenOAIR HFA 108 (90 Base) MCG/ACT inhaler Inhale 1-2 puffs into the lungs every 6 (six) hours as needed. As needed  . Semaglutide (RYBELSUS) 7 MG TABS Take 7 mg by mouth daily.   . SUMAtriptan (IMITREX) 100 MG tablet Take 1 tablet (100 mg total) by mouth every 2 (two) hours as needed for migraine. May repeat in 2 hours if headache persists or recurs.  . topiramate (TOPAMAX) 25 MG tablet Take 1 tablet in AM, 2 tablets every night  . traZODone (DESYREL) 100 MG tablet Take 4 tablets (400 mg total) by mouth at bedtime.  . triamcinolone ointment (KENALOG) 0.5 % Apply 1 application topically 2 (two) times daily. (Patient taking differently: Apply 1 application topically 2 (two) times daily as needed (skin irritation/rash.). )  . VASCEPA 1 g CAPS TAKE 2 CAPSULES TWICE A DAY WITH MEALS (Patient taking differently: Take 2 g by mouth in the morning and at bedtime. )  . [DISCONTINUED] budesonide-formoterol (SYMBICORT) 80-4.5 MCG/ACT inhaler Inhale 2 puffs into the lungs 2 (two) times daily. (Patient taking differently: Inhale 2 puffs into the lungs 2 (two) times daily as needed (shortness of breath). )  . [DISCONTINUED] PROAIR HFA 108 (90  Base) MCG/ACT inhaler Inhale 1-2 puffs into the lungs every 6 (six) hours as needed. As needed  . [DISCONTINUED] topiramate (TOPAMAX) 25 MG tablet Take 1 tablet in AM, 3 tablets every night  .  albuterol (PROVENTIL) (2.5 MG/3ML) 0.083% nebulizer solution Take 3 mLs (2.5 mg total) by nebulization every 6 (six) hours as needed for wheezing or shortness of breath.   Facility-Administered Encounter Medications as of 08/29/2019  Medication  . bupivacaine-epinephrine (MARCAINE W/ EPI) 0.5% -1:200000 injection    Allergies  Allergen Reactions  . Ivp Dye [Iodinated Diagnostic Agents] Anaphylaxis and Swelling    Throat closes  . Latex     Latex tape pulls skin with it    Review of Systems  Constitutional: Negative for activity change, appetite change, chills, diaphoresis, fatigue, fever and unexpected weight change.  HENT: Negative.   Eyes: Negative.  Negative for photophobia and visual disturbance.  Respiratory: Negative for cough, chest tightness, shortness of breath (with exertion) and wheezing.   Cardiovascular: Negative for chest pain, palpitations and leg swelling.  Gastrointestinal: Negative for abdominal pain, blood in stool, constipation, diarrhea, nausea and vomiting.  Endocrine: Negative.  Negative for cold intolerance, heat intolerance, polydipsia, polyphagia and polyuria.  Genitourinary: Positive for frequency. Negative for decreased urine volume, difficulty urinating, dysuria, hematuria and urgency.  Musculoskeletal: Positive for arthralgias, back pain, gait problem and myalgias. Negative for joint swelling, neck pain and neck stiffness.  Skin: Negative.   Allergic/Immunologic: Negative.   Neurological: Negative for dizziness, tremors, seizures, syncope, facial asymmetry, speech difficulty, weakness, light-headedness, numbness and headaches.  Hematological: Negative.   Psychiatric/Behavioral: Negative for confusion, hallucinations, sleep disturbance and suicidal ideas.  All other  systems reviewed and are negative.       Objective:  BP 104/80   Pulse 69   Ht 5' 2"  (1.575 m)   Wt 198 lb 6.4 oz (90 kg)   SpO2 96%   BMI 36.29 kg/m    Wt Readings from Last 3 Encounters:  08/29/19 197 lb (89.4 kg)  08/29/19 198 lb 6.4 oz (90 kg)  08/26/19 198 lb 9.6 oz (90.1 kg)    Physical Exam Vitals and nursing note reviewed.  Constitutional:      General: She is not in acute distress.    Appearance: Normal appearance. She is well-developed and well-groomed. She is obese. She is not ill-appearing, toxic-appearing or diaphoretic.  HENT:     Head: Normocephalic and atraumatic.     Jaw: There is normal jaw occlusion.     Right Ear: Hearing normal.     Left Ear: Hearing normal.     Nose: Nose normal.     Mouth/Throat:     Lips: Pink.     Mouth: Mucous membranes are moist.     Pharynx: Oropharynx is clear. Uvula midline.  Eyes:     General: Lids are normal.     Extraocular Movements: Extraocular movements intact.     Conjunctiva/sclera: Conjunctivae normal.     Pupils: Pupils are equal, round, and reactive to light.  Neck:     Thyroid: No thyroid mass, thyromegaly or thyroid tenderness.     Vascular: No carotid bruit or JVD.     Trachea: Trachea and phonation normal.  Cardiovascular:     Rate and Rhythm: Normal rate and regular rhythm.     Chest Wall: PMI is not displaced.     Pulses: Normal pulses.     Heart sounds: Normal heart sounds. No murmur. No friction rub. No gallop. No S3 sounds.   Pulmonary:     Effort: Pulmonary effort is normal. No respiratory distress.     Breath sounds: Normal breath sounds. No decreased breath sounds, wheezing, rhonchi or rales.  Abdominal:  General: Bowel sounds are normal. There is no distension or abdominal bruit.     Palpations: Abdomen is soft. There is no hepatomegaly or splenomegaly.     Tenderness: There is no abdominal tenderness. There is no right CVA tenderness or left CVA tenderness.     Hernia: No hernia is  present.  Musculoskeletal:     Cervical back: Normal range of motion and neck supple.     Right lower leg: No edema.     Left lower leg: No edema.     Comments: Right shoulder in sling from recent surgery  Lymphadenopathy:     Cervical: No cervical adenopathy.  Skin:    General: Skin is warm and dry.     Capillary Refill: Capillary refill takes less than 2 seconds.     Coloration: Skin is not cyanotic, jaundiced or pale.     Findings: No rash.  Neurological:     General: No focal deficit present.     Mental Status: She is alert and oriented to person, place, and time.     Cranial Nerves: Cranial nerves are intact. No cranial nerve deficit.     Sensory: Sensation is intact. No sensory deficit.     Motor: Motor function is intact. No weakness.     Coordination: Coordination is intact. Coordination normal.     Gait: Gait abnormal (antalgic).     Deep Tendon Reflexes: Reflexes are normal and symmetric. Reflexes normal.  Psychiatric:        Attention and Perception: Attention and perception normal.        Mood and Affect: Mood and affect normal.        Speech: Speech is delayed.        Behavior: Behavior is slowed. Behavior is cooperative.        Thought Content: Thought content normal.        Cognition and Memory: Cognition and memory normal.        Judgment: Judgment normal.     Results for orders placed or performed in visit on 08/29/19  Microscopic Examination   URINE  Result Value Ref Range   WBC, UA 0-5 0 - 5 /hpf   RBC None seen 0 - 2 /hpf   Epithelial Cells (non renal) 0-10 0 - 10 /hpf   Renal Epithel, UA None seen None seen /hpf   Bacteria, UA Few None seen/Few  Bayer DCA Hb A1c Waived  Result Value Ref Range   HB A1C (BAYER DCA - WAIVED) 7.1 (H) <7.0 %  Lipid panel  Result Value Ref Range   Cholesterol, Total WILL FOLLOW    Triglycerides WILL FOLLOW    HDL WILL FOLLOW    VLDL Cholesterol Cal WILL FOLLOW    LDL Chol Calc ([facility]) WILL FOLLOW    Lipid Comment: WILL  FOLLOW    Chol/HDL Ratio WILL FOLLOW   CMP14+EGFR  Result Value Ref Range   Glucose WILL FOLLOW    BUN WILL FOLLOW    Creatinine, Ser WILL FOLLOW    GFR calc non Af Amer WILL FOLLOW    GFR calc Af Amer WILL FOLLOW    BUN/Creatinine Ratio WILL FOLLOW    Sodium WILL FOLLOW    Potassium WILL FOLLOW    Chloride WILL FOLLOW    CO2 WILL FOLLOW    Calcium WILL FOLLOW    Total Protein WILL FOLLOW    Albumin WILL FOLLOW    Globulin, Total WILL FOLLOW    Albumin/Globulin Ratio WILL  FOLLOW    Bilirubin Total WILL FOLLOW    Alkaline Phosphatase WILL FOLLOW    AST WILL FOLLOW    ALT WILL FOLLOW   CBC with Differential/Platelet  Result Value Ref Range   WBC 12.9 (H) 3.4 - 10.8 x10E3/uL   RBC 3.81 3.77 - 5.28 x10E6/uL   Hemoglobin 11.7 11.1 - 15.9 g/dL   Hematocrit 34.3 34.0 - 46.6 %   MCV 90 79 - 97 fL   MCH 30.7 26.6 - 33.0 pg   MCHC 34.1 31.5 - 35.7 g/dL   RDW 13.5 11.7 - 15.4 %   Platelets 234 150 - 450 x10E3/uL   Neutrophils 64 Not Estab. %   Lymphs 25 Not Estab. %   Monocytes 7 Not Estab. %   Eos 3 Not Estab. %   Basos 0 Not Estab. %   Neutrophils Absolute 8.3 (H) 1.4 - 7.0 x10E3/uL   Lymphocytes Absolute 3.3 (H) 0.7 - 3.1 x10E3/uL   Monocytes Absolute 0.8 0.1 - 0.9 x10E3/uL   EOS (ABSOLUTE) 0.4 0.0 - 0.4 x10E3/uL   Basophils Absolute 0.1 0.0 - 0.2 x10E3/uL   Immature Granulocytes 1 Not Estab. %   Immature Grans (Abs) 0.1 0.0 - 0.1 x10E3/uL  Thyroid Panel With TSH  Result Value Ref Range   TSH WILL FOLLOW    T4, Total WILL FOLLOW    T3 Uptake Ratio WILL FOLLOW    Free Thyroxine Index WILL FOLLOW   Urinalysis, Complete  Result Value Ref Range   Specific Gravity, UA 1.010 1.005 - 1.030   pH, UA 5.0 5.0 - 7.5   Color, UA Yellow Yellow   Appearance Ur Clear Clear   Leukocytes,UA Negative Negative   Protein,UA Negative Negative/Trace   Glucose, UA Negative Negative   Ketones, UA Negative Negative   RBC, UA Negative Negative   Bilirubin, UA Negative Negative    Urobilinogen, Ur 0.2 0.2 - 1.0 mg/dL   Nitrite, UA Negative Negative   Microscopic Examination See below:      urinalysis unremarkable in office.   Pertinent labs & imaging results that were available during my care of the patient were reviewed by me and considered in my medical decision making.  Assessment & Plan:  Jaqulyn was seen today for hospitalization follow-up.  Diagnoses and all orders for this visit:  Hospital discharge follow-up Doing well since being home. Has followed up with neurology and will see pain management soon. Pt aware to make follow up with cardiology. Labs pending.  -     CMP14+EGFR -     CBC with Differential/Platelet -     albuterol (PROVENTIL) (2.5 MG/3ML) 0.083% nebulizer solution; Take 3 mLs (2.5 mg total) by nebulization every 6 (six) hours as needed for wheezing or shortness of breath. -     PROAIR HFA 108 (90 Base) MCG/ACT inhaler; Inhale 1-2 puffs into the lungs every 6 (six) hours as needed. As needed  Dyspnea on exertion Great improvement since being home. Will continue albuterol as needed and Symbicort daily. Pt aware of need for proper dosing of Symbicort as she has only been using as needed.  -     CMP14+EGFR -     CBC with Differential/Platelet -     albuterol (PROVENTIL) (2.5 MG/3ML) 0.083% nebulizer solution; Take 3 mLs (2.5 mg total) by nebulization every 6 (six) hours as needed for wheezing or shortness of breath. -     PROAIR HFA 108 (90 Base) MCG/ACT inhaler; Inhale 1-2 puffs into the lungs  every 6 (six) hours as needed. As needed -     budesonide-formoterol (SYMBICORT) 80-4.5 MCG/ACT inhaler; Inhale 2 puffs into the lungs 2 (two) times daily.  Hyperlipidemia associated with type 2 diabetes mellitus ([place]) Diet encouraged - increase intake of fresh fruits and vegetables, increase intake of lean proteins. Bake, broil, or grill foods. Avoid fried, greasy, and fatty foods. Avoid fast foods. Increase intake of fiber-rich whole grains. Exercise  encouraged - at least 150 minutes per week and advance as tolerated.  Goal BMI < 25. Continue medications as prescribed. Follow up in 3-6 months as discussed.  -     Lipid panel  Hypertension associated with diabetes ([place]) BP well controlled. Changes were not made in regimen today. Goal BP is 130/80. Pt aware to report any persistent high or low readings. DASH diet and exercise encouraged. Exercise at least 150 minutes per week and increase as tolerated. Goal BMI > 25. Stress management encouraged. Avoid nicotine and tobacco product use. Avoid excessive alcohol and NSAID's. Avoid more than 2000 mg of sodium daily. Medications as prescribed. Follow up as scheduled.  -     Microalbumin / creatinine urine ratio -     Lipid panel -     CMP14+EGFR -     CBC with Differential/Platelet -     Thyroid Panel With TSH  Type 2 diabetes mellitus with other specified complication, with long-term current use of insulin (H[facility]) A1C 7.1 today. No changes in current regimen. Labs pending. Pt aware to have eye exam completed when due in June.   -     Bayer DCA Hb A1c Waived -     Microalbumin / creatinine urine ratio -     Lipid panel -     CMP14+EGFR -     CBC with Differential/Platelet -     Thyroid Panel With TSH  Chronic diastolic CHF (congestive heart failure) ([place]) Labs pending. No PND or orthopnea. Keep follow up with cardiology.  -     CMP14+EGFR -     CBC with Differential/Platelet  Morbid obesity (HCC) Diet and exercise encouraged. Labs pending.  -     Lipid panel -     CMP14+EGFR -     CBC with Differential/Platelet -     Thyroid Panel With TSH  Polypharmacy Pt aware that medications need to be adjusted to avoid ongoing brain fog, slowed speech, and delayed reaction. Pt will speak to pain management clinic at next visit. Pt has not cut back on Topamax as discussed. Reeducated pt about this change that was ordered by neurology. Pt will adjust dosing.   Frequency of micturition Urinalysis  unremarkable in office. Culture pending. Will treat if warranted.  -     Urinalysis, Complete -     Urine Culture -     Microscopic Examination  Migraine with aura and without status migrainosus, not intractable Changes per neurology recommendations.  -     topiramate (TOPAMAX) 25 MG tablet; Take 1 tablet in AM, 2 tablets every night     Continue all other maintenance medications.  Follow up plan: Return in about 2 weeks (around 09/12/2019), or if symptoms worsen or fail to improve, for Dyspnea.   Medical decision-making:  45 minutes spent today reviewing the medical chart, counseling the patient/family, and documenting today's visit.  Continue healthy lifestyle choices, including diet (rich in fruits, vegetables, and lean proteins, and low in salt and simple carbohydrates) and exercise (at least 30 minutes of moderate physical activity  daily).  Educational handout given for DM  The above assessment and management plan was discussed with the patient. The patient verbalized understanding of and has agreed to the management plan. Patient is aware to call the clinic if they develop any new symptoms or if symptoms persist or worsen. Patient is aware when to return to the clinic for a follow-up visit. Patient educated on when it is appropriate to go to the emergency department.   Monia Pouch, FNP-C [facility] [phone]

## 2019-08-30 ENCOUNTER — Telehealth: Payer: Self-pay

## 2019-08-30 LAB — LIPID PANEL
Chol/HDL Ratio: 3.3 ratio (ref 0.0–4.4)
Cholesterol, Total: 128 mg/dL (ref 100–199)
HDL: 39 mg/dL — ABNORMAL LOW (ref >39)
LDL Chol Calc (NIH): 62 mg/dL (ref 0–99)
Triglycerides: 160 mg/dL — ABNORMAL HIGH (ref 0–149)
VLDL Cholesterol Cal: 27 mg/dL (ref 5–40)

## 2019-08-30 LAB — CMP14+EGFR
ALT: 12 IU/L (ref 0–32)
AST: 17 IU/L (ref 0–40)
Albumin/Globulin Ratio: 1.6 (ref 1.2–2.2)
Albumin: 3.9 g/dL (ref 3.7–4.7)
Alkaline Phosphatase: 115 IU/L (ref 39–117)
BUN/Creatinine Ratio: 18 (ref 12–28)
BUN: 16 mg/dL (ref 8–27)
Bilirubin Total: 0.6 mg/dL (ref 0.0–1.2)
CO2: 24 mmol/L (ref 20–29)
Calcium: 9.6 mg/dL (ref 8.7–10.3)
Chloride: 104 mmol/L (ref 96–106)
Creatinine, Ser: 0.88 mg/dL (ref 0.57–1.00)
GFR calc Af Amer: 76 mL/min/{1.73_m2} (ref >59)
GFR calc non Af Amer: 66 mL/min/{1.73_m2} (ref >59)
Globulin, Total: 2.4 g/dL (ref 1.5–4.5)
Glucose: 158 mg/dL — ABNORMAL HIGH (ref 65–99)
Potassium: 4.9 mmol/L (ref 3.5–5.2)
Sodium: 141 mmol/L (ref 134–144)
Total Protein: 6.3 g/dL (ref 6.0–8.5)

## 2019-08-30 LAB — THYROID PANEL WITH TSH
Free Thyroxine Index: 1.8 (ref 1.2–4.9)
T3 Uptake Ratio: 26 % (ref 24–39)
T4, Total: 7 ug/dL (ref 4.5–12.0)
TSH: 2.34 u[IU]/mL (ref 0.450–4.500)

## 2019-08-30 LAB — CBC WITH DIFFERENTIAL/PLATELET
Basophils Absolute: 0.1 10*3/uL (ref 0.0–0.2)
Basos: 0 %
EOS (ABSOLUTE): 0.4 10*3/uL (ref 0.0–0.4)
Eos: 3 %
Hematocrit: 34.3 % (ref 34.0–46.6)
Hemoglobin: 11.7 g/dL (ref 11.1–15.9)
Immature Grans (Abs): 0.1 10*3/uL (ref 0.0–0.1)
Immature Granulocytes: 1 %
Lymphocytes Absolute: 3.3 10*3/uL — ABNORMAL HIGH (ref 0.7–3.1)
Lymphs: 25 %
MCH: 30.7 pg (ref 26.6–33.0)
MCHC: 34.1 g/dL (ref 31.5–35.7)
MCV: 90 fL (ref 79–97)
Monocytes Absolute: 0.8 10*3/uL (ref 0.1–0.9)
Monocytes: 7 %
Neutrophils Absolute: 8.3 10*3/uL — ABNORMAL HIGH (ref 1.4–7.0)
Neutrophils: 64 %
Platelets: 234 10*3/uL (ref 150–450)
RBC: 3.81 x10E6/uL (ref 3.77–5.28)
RDW: 13.5 % (ref 11.7–15.4)
WBC: 12.9 10*3/uL — ABNORMAL HIGH (ref 3.4–10.8)

## 2019-08-30 LAB — MICROALBUMIN / CREATININE URINE RATIO
Creatinine, Urine: 24.1 mg/dL
Microalb/Creat Ratio: <12 mg/g creat (ref 0–29)
Microalbumin, Urine: <3 ug/mL

## 2019-08-30 LAB — URINE CULTURE

## 2019-08-30 NOTE — Telephone Encounter (Signed)
Pt is asking if her furosemide can be decreased. She feels she is going to the bathroom too much.  Please call [phone]  Thanks renee

## 2019-08-30 NOTE — Progress Notes (Signed)
Urine microalbumin is normal. Triglycerides are elevated at 160, limit intake of fried, greasy, fatty, and fast foods. Make sure to continue medications as prescribed.  Liver and renal functions normal.  Glucose high at 158. Limit intake of sugary foods and drinks and continue medications as prescribed.  WBC is elevated. This is likely due to recent shoulder surgery. We will recheck this in 1 week to see if it has returned to normal.  Thyroid function normal.

## 2019-08-30 NOTE — Addendum Note (Signed)
Addended by: Brynda Peon F on: 08/30/2019 11:50 AM   Modules accepted: Orders

## 2019-08-31 NOTE — Telephone Encounter (Signed)
Pt states that she would like to reduce her Lasix because of incontinence doing the night. Daily wts are as follows 196, 195, 195.

## 2019-08-31 NOTE — Telephone Encounter (Signed)
Pt notified and voiced understanding 

## 2019-08-31 NOTE — Telephone Encounter (Signed)
I would recommend she just take the same dose earlier in the day, perhaps around noon.

## 2019-09-01 DIAGNOSIS — Z4789 Encounter for other orthopedic aftercare: Secondary | ICD-10-CM | POA: Diagnosis not present

## 2019-09-07 ENCOUNTER — Telehealth: Payer: Self-pay | Admitting: Family Medicine

## 2019-09-07 DIAGNOSIS — D631 Anemia in chronic kidney disease: Secondary | ICD-10-CM | POA: Diagnosis not present

## 2019-09-07 DIAGNOSIS — Z79899 Other long term (current) drug therapy: Secondary | ICD-10-CM | POA: Diagnosis not present

## 2019-09-07 DIAGNOSIS — E559 Vitamin D deficiency, unspecified: Secondary | ICD-10-CM | POA: Diagnosis not present

## 2019-09-07 DIAGNOSIS — R809 Proteinuria, unspecified: Secondary | ICD-10-CM | POA: Diagnosis not present

## 2019-09-07 DIAGNOSIS — N1831 Chronic kidney disease, stage 3a: Secondary | ICD-10-CM | POA: Diagnosis not present

## 2019-09-07 NOTE — Telephone Encounter (Signed)
FYI  Patient was advised to check with the other provider to see what labs he wanted.  She was advised that there are results for  a multitude of  blood work and we may already have what he wanted.

## 2019-09-07 NOTE — Telephone Encounter (Signed)
I do not need any additional labs

## 2019-09-07 NOTE — Telephone Encounter (Signed)
Patient aware, she will go to other office to have labs done for them.

## 2019-09-07 NOTE — Telephone Encounter (Signed)
Patient had labs done here on 08-29-19 by our provider.  I don't think we do lab work for other provider's?  Please review and advise. Thanks

## 2019-09-12 ENCOUNTER — Other Ambulatory Visit: Payer: Self-pay

## 2019-09-12 ENCOUNTER — Encounter: Payer: Self-pay | Admitting: Cardiovascular Disease

## 2019-09-12 ENCOUNTER — Ambulatory Visit (INDEPENDENT_AMBULATORY_CARE_PROVIDER_SITE_OTHER): Payer: Medicare Other | Admitting: Cardiovascular Disease

## 2019-09-12 VITALS — BP 124/84 | HR 75 | Temp 97.6°F | Ht 62.0 in | Wt 197.0 lb

## 2019-09-12 DIAGNOSIS — I10 Essential (primary) hypertension: Secondary | ICD-10-CM | POA: Diagnosis not present

## 2019-09-12 DIAGNOSIS — I4821 Permanent atrial fibrillation: Secondary | ICD-10-CM | POA: Diagnosis not present

## 2019-09-12 DIAGNOSIS — E782 Mixed hyperlipidemia: Secondary | ICD-10-CM

## 2019-09-12 DIAGNOSIS — I5032 Chronic diastolic (congestive) heart failure: Secondary | ICD-10-CM | POA: Diagnosis not present

## 2019-09-12 MED ORDER — FUROSEMIDE 40 MG PO TABS: 20.0000 mg | ORAL_TABLET | ORAL | 1 refills | Status: DC

## 2019-09-12 NOTE — Patient Instructions (Addendum)
Medication Instructions:  Your physician recommends that you continue on your current medications as directed. Please refer to the Current Medication list given to you today.  *If you need a refill on your cardiac medications before your next appointment, please call your pharmacy*   Lab Work: None today If you have labs (blood work) drawn today and your tests are completely normal, you will receive your results only by: . MyChart Message (if you have MyChart) OR . A paper copy in the mail If you have any lab test that is abnormal or we need to change your treatment, we will call you to review the results.   Testing/Procedures: None today   Follow-Up: At [facility], you and your health needs are our priority.  As part of our continuing mission to provide you with exceptional heart care, we have created designated Provider Care Teams.  These Care Teams include your primary Cardiologist (physician) and Advanced Practice Providers (APPs -  Physician Assistants and Nurse Practitioners) who all work together to provide you with the care you need, when you need it.  We recommend signing up for the patient portal called "MyChart".  Sign up information is provided on this After Visit Summary.  MyChart is used to connect with patients for Virtual Visits (Telemedicine).  Patients are able to view lab/test results, encounter notes, upcoming appointments, etc.  Non-urgent messages can be sent to your provider as well.   To learn more about what you can do with MyChart, go to [email or web address].    Your next appointment:   6 month(s)  The format for your next appointment:   In Person  Provider:   Brittany Strader, PA-C   Other Instructions None      Thank you for choosing [facility] !         

## 2019-09-12 NOTE — Progress Notes (Signed)
SUBJECTIVE: The patient presents for follow-up of chronic diastolic heart failure and permanent atrial fibrillation.  She does not like taking Lasix due to increased urinary frequency.  Echocardiogram on 01/22/2019 demonstrated normal LV systolic function, EF 55 to 60%. There was mild aortic regurgitation.  She denies chest pain, palpitations, and leg swelling.  Chronic exertional dyspnea stable.  We spoke about her Lasix dosing for quite some time as she wants to reduce the dose to either 20 mg or not take it at all.  We talked about the deleterious effects of not taking an adequate dose of diuretic.      Review of Systems: As per "subjective", otherwise negative.  Allergies  Allergen Reactions  . Ivp Dye [Iodinated Diagnostic Agents] Anaphylaxis and Swelling    Throat closes  . Latex     Latex tape pulls skin with it    Current Outpatient Medications  Medication Sig Dispense Refill  . acetaminophen (TYLENOL) 325 MG tablet Take 325-650 mg by mouth every 6 (six) hours as needed for mild pain, moderate pain or headache.    . albuterol (PROVENTIL) (2.5 MG/3ML) 0.083% nebulizer solution Take 3 mLs (2.5 mg total) by nebulization every 6 (six) hours as needed for wheezing or shortness of breath. 150 mL 1  . ARIPiprazole (ABILIFY) 10 MG tablet Take 1 tablet (10 mg total) by mouth 2 (two) times daily. (Patient taking differently: Take 10 mg by mouth once. ) 90 tablet 5  . atorvastatin (LIPITOR) 40 MG tablet Take 1 tablet (40 mg total) by mouth daily. 90 tablet 3  . budesonide-formoterol (SYMBICORT) 80-4.5 MCG/ACT inhaler Inhale 2 puffs into the lungs 2 (two) times daily. 2 Inhaler 5  . carbamazepine (CARBATROL) 100 MG 12 hr capsule Take 1 capsule (100 mg total) by mouth daily. 90 capsule 1  . Cholecalciferol (VITAMIN D3) 1000 UNITS CAPS Take 1,000 Units by mouth daily.    Marland Kitchen diltiazem (CARDIZEM CD) 120 MG 24 hr capsule TAKE 1 CAPSULE AT BEDTIME (Patient taking differently: Take 120 mg  by mouth daily. ) 90 capsule 3  . ELIQUIS 5 MG TABS tablet TAKE 1 TABLET TWICE A DAY (Patient taking differently: Take 5 mg by mouth 2 (two) times daily. ) 180 tablet 3  . Eszopiclone (ESZOPICLONE) 3 MG TABS Take 1 tablet (3 mg total) by mouth at bedtime as needed. Take immediately before bedtime (Patient taking differently: Take 3 mg by mouth at bedtime as needed (sleep). Take immediately before bedtime) 90 tablet 1  . furosemide (LASIX) 40 MG tablet Take 1 tablet (40 mg total) by mouth every morning. 90 tablet 1  . ibuprofen (ADVIL) 800 MG tablet TAKE 1 TABLET BY MOUTH EVERY 8 HOURS AS NEEDED 30 tablet 1  . Insulin Glargine (LANTUS SOLOSTAR) 100 UNIT/ML Solostar Pen INJECT 60 UNITS UNDER THE SKIN TWICE A DAY (Patient taking differently: Inject 60 Units into the skin 2 (two) times daily. ) 90 mL 0  . LORazepam (ATIVAN) 1 MG tablet Take 1-2 tablets (1-2 mg total) by mouth See admin instructions. Take 1 tab (1 mg) in the morning and 2 tabs (2 mg ) at bedtime 90 tablet 4  . metaxalone (SKELAXIN) 800 MG tablet Take 1 tablet (800 mg total) by mouth 3 (three) times daily as needed for muscle spasms. 90 tablet 3  . metoprolol tartrate (LOPRESSOR) 100 MG tablet Take 1 tablet (100 mg total) by mouth 2 (two) times daily. Take 115m + 283mfor a total of 12550m  twice a day.    . metoprolol tartrate (LOPRESSOR) 25 MG tablet TAKE 1 TABLET TWICE A DAY (TO BE TAKEN ALONG WITH THE LOPRESSOR 100 MG TWICE A DAY) 180 tablet 1  . naloxone (NARCAN) nasal spray 4 mg/0.1 mL Place 1 spray into the nose once as needed (opiod overdose).    Marland Kitchen oxyCODONE-acetaminophen (PERCOCET/ROXICET) 5-325 MG tablet Take 1 tablet by mouth 4 (four) times daily as needed (pain.). 30 tablet 0  . potassium chloride SA (KLOR-CON) 20 MEQ tablet Take 1 tablet (20 mEq total) by mouth daily. (Patient taking differently: Take 20 mEq by mouth every evening. ) 90 tablet 3  . pregabalin (LYRICA) 200 MG capsule Take 1 capsule (200 mg total) by mouth 2 (two)  times daily. 60 capsule 3  . PROAIR HFA 108 (90 Base) MCG/ACT inhaler Inhale 1-2 puffs into the lungs every 6 (six) hours as needed. As needed 18 g 11  . Semaglutide (RYBELSUS) 7 MG TABS Take 7 mg by mouth daily.     . SUMAtriptan (IMITREX) 100 MG tablet Take 1 tablet (100 mg total) by mouth every 2 (two) hours as needed for migraine. May repeat in 2 hours if headache persists or recurs. 10 tablet 5  . topiramate (TOPAMAX) 25 MG tablet Take 1 tablet in AM, 2 tablets every night 120 tablet 11  . traZODone (DESYREL) 100 MG tablet Take 4 tablets (400 mg total) by mouth at bedtime. 360 tablet 1  . triamcinolone ointment (KENALOG) 0.5 % Apply 1 application topically 2 (two) times daily. (Patient taking differently: Apply 1 application topically 2 (two) times daily as needed (skin irritation/rash.). ) 30 g 0  . VASCEPA 1 g CAPS TAKE 2 CAPSULES TWICE A DAY WITH MEALS (Patient taking differently: Take 2 g by mouth in the morning and at bedtime. ) 360 capsule 3   No current facility-administered medications for this visit.   Facility-Administered Medications Ordered in Other Visits  Medication Dose Route Frequency Provider Last Rate Last Admin  . bupivacaine-epinephrine (MARCAINE W/ EPI) 0.5% -1:200000 injection    Anesthesia Intra-op Effie Berkshire, MD   12 mL at 01/21/19 0935    Past Medical History:  Diagnosis Date  . Atrial fibrillation with RVR 05/19/2017  . Bipolar I disorder 01/23/2015  . CAD (coronary artery disease)    Nonobstructive; Managed by Dr. Bronson Ing  . Cardiomegaly 01/12/2018  . Chronic anticoagulation 05/30/2015  . Chronic back pain    Lower back  . Chronic diastolic CHF (congestive heart failure) 05/30/2015  . Diabetes mellitus, type 2, without complication   . Diabetic neuropathy 02/06/2016  . Dysrhythmia    A-Fib  . Essential hypertension   . Herpes genitalis in women 07/16/2015  . Hyperlipidemia   . Hypertension   . Hypoxia 02/01/2019  . Insomnia 01/23/2015  . Lipoma  02/08/2015  . Major depressive disorder   . Migraine headache with aura 02/12/2016  . 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 NAFL (nonalcoholic fatty liver) 1/61/0960  . OSA (obstructive sleep apnea) 02/24/2019   10/09/2018 - HST  - AHI 40.6   . Pulmonary hypertension   . Renal insufficiency    Managed by Dr. Wallace Keller  . RLS (restless legs syndrome) 04/27/2015    Past Surgical History:  Procedure Laterality Date  . BREAST REDUCTION SURGERY    . EYE SURGERY Right    cateracts  . HAMMER TOE SURGERY    . LEFT HEART CATH AND CORONARY ANGIOGRAPHY N/A 02/03/2018   Procedure:  LEFT HEART CATH AND CORONARY ANGIOGRAPHY;  Surgeon: Martinique, Peter M, MD;  Location: [facility];  Service: Cardiovascular;  Laterality: N/A;  . REVERSE SHOULDER ARTHROPLASTY Right 08/19/2019   Procedure: REVERSE SHOULDER ARTHROPLASTY;  Surgeon: Netta Cedars, MD;  Location: [facility];  Service: Orthopedics;  Laterality: Right;  interscalene block  . SHOULDER SURGERY Right    "I BROKE MY SHOUDLER  . THIGH SURGERY     "TO REMOVE A TUMOR "    Social History   Socioeconomic History  . Marital status: Married    Spouse name: Orpah Greek  . Number of children: 3  . Years of education: 24  . Highest education level: Associate degree: academic program  Occupational History  . Occupation: Retired  Tobacco Use  . Smoking status: Never Smoker  . Smokeless tobacco: Never Used  Substance and Sexual Activity  . Alcohol use: Yes    Alcohol/week: 0.0 standard drinks    Comment: once a week  . Drug use: No  . Sexual activity: Yes    Partners: Male    Birth control/protection: Post-menopausal  Other Topics Concern  . Not on file  Social History Narrative   Lives at home with husband.       Right handed   Social Determinants of Health   Financial Resource Strain:   . Difficulty of Paying Living Expenses:   Food Insecurity:   . Worried About Charity fundraiser in the Last Year:   . Arboriculturist in  the Last Year:   Transportation Needs:   . Film/video editor (Medical):   Marland Kitchen Lack of Transportation (Non-Medical):   Physical Activity:   . Days of Exercise per Week:   . Minutes of Exercise per Session:   Stress:   . Feeling of Stress :   Social Connections:   . Frequency of Communication with Friends and Family:   . Frequency of Social Gatherings with Friends and Family:   . Attends Religious Services:   . Active Member of Clubs or Organizations:   . Attends Archivist Meetings:   Marland Kitchen Marital Status:   Intimate Partner Violence:   . Fear of Current or Ex-Partner:   . Emotionally Abused:   Marland Kitchen Physically Abused:   . Sexually Abused:     Barbarann Ehlers, RN was present throughout the entirety of the encounter.  Vitals:   09/12/19 1121  BP: 124/84  Pulse: 75  Temp: 97.6 F (36.4 C)  SpO2: 96%  Weight: 197 lb (89.4 kg)  Height: _0  (1.575 m)    Wt Readings from Last 3 Encounters:  09/12/19 197 lb (89.4 kg)  08/29/19 197 lb (89.4 kg)  08/29/19 198 lb 6.4 oz (90 kg)     PHYSICAL EXAM General: NAD HEENT: Normal. Neck: No JVD, no thyromegaly. Lungs: Clear to auscultation bilaterally with normal respiratory effort. CV: Regular rate and irregular rhythm, normal S1/S2, no S3, no murmur. No pretibial or periankle edema.  No carotid bruit.   Abdomen: Soft, nontender, no distention.  Neurologic: Alert and oriented.  Psych: Normal affect. Skin: Normal. Musculoskeletal: No gross deformities.      Labs: Lab Results  Component Value Date/Time   K 4.9 08/29/2019 10:48 AM   BUN 16 08/29/2019 10:48 AM   CREATININE 0.88 08/29/2019 10:48 AM   ALT 12 08/29/2019 10:48 AM   TSH 2.340 08/29/2019 10:48 AM   HGB 11.7 08/29/2019 10:48 AM     Lipids: Lab Results  Component  Value Date/Time   LDLCALC 62 08/29/2019 10:48 AM   LDLDIRECT 62 03/17/2016 04:14 PM   CHOL 128 08/29/2019 10:48 AM   TRIG 160 (H) 08/29/2019 10:48 AM   HDL 39 (L) 08/29/2019 10:48 AM        ASSESSMENT AND PLAN:  1. Permanent atrial fibrillation:Symptomatically stable. Continuelong acting diltiazem 120 mg dailyandmetoprolol25 mg twice daily. Anticoagulated withapixaban.  2. Chronic diastolic heart failure: Euvolemic.  Weight is stable.  We spoke about her Lasix dosing for quite some time as she wants to reduce the dose to either 20 mg or not take it at all.  We talked about the deleterious effects of not taking an adequate dose of diuretic.  Because of this, we will reduce the dose of Lasix to 20 mg daily along with supplemental potassium.  She has already been instructed that she can take an extra 20-40 mg with supplemental potassium for weight gain of 3 pounds in 24 hours.  She has been educated on the importance of a low-sodium diet.  3.  Hypertension: Blood pressure is normal.  No changes to therapy.  4.  Hyperlipidemia: Continue Lipitor 40 mgandVascepa.   Disposition: Follow up 6 months   Kate Sable, M.D., F.A.C.C.

## 2019-09-14 DIAGNOSIS — I129 Hypertensive chronic kidney disease with stage 1 through stage 4 chronic kidney disease, or unspecified chronic kidney disease: Secondary | ICD-10-CM | POA: Diagnosis not present

## 2019-09-14 DIAGNOSIS — E559 Vitamin D deficiency, unspecified: Secondary | ICD-10-CM | POA: Diagnosis not present

## 2019-09-14 DIAGNOSIS — E1122 Type 2 diabetes mellitus with diabetic chronic kidney disease: Secondary | ICD-10-CM | POA: Diagnosis not present

## 2019-09-14 DIAGNOSIS — E870 Hyperosmolality and hypernatremia: Secondary | ICD-10-CM | POA: Diagnosis not present

## 2019-09-14 DIAGNOSIS — R809 Proteinuria, unspecified: Secondary | ICD-10-CM | POA: Diagnosis not present

## 2019-09-14 DIAGNOSIS — E211 Secondary hyperparathyroidism, not elsewhere classified: Secondary | ICD-10-CM | POA: Diagnosis not present

## 2019-09-14 DIAGNOSIS — E1129 Type 2 diabetes mellitus with other diabetic kidney complication: Secondary | ICD-10-CM | POA: Diagnosis not present

## 2019-09-14 DIAGNOSIS — Z79899 Other long term (current) drug therapy: Secondary | ICD-10-CM | POA: Diagnosis not present

## 2019-09-14 DIAGNOSIS — N189 Chronic kidney disease, unspecified: Secondary | ICD-10-CM | POA: Diagnosis not present

## 2019-09-18 ENCOUNTER — Other Ambulatory Visit: Payer: Self-pay | Admitting: Cardiovascular Disease

## 2019-09-26 ENCOUNTER — Encounter: Payer: Self-pay | Admitting: Family Medicine

## 2019-09-26 ENCOUNTER — Encounter
Payer: Medicare Other | Attending: Physical Medicine and Rehabilitation | Admitting: Physical Medicine and Rehabilitation

## 2019-09-26 ENCOUNTER — Other Ambulatory Visit: Payer: Self-pay

## 2019-09-26 ENCOUNTER — Encounter: Payer: Self-pay | Admitting: Physical Medicine and Rehabilitation

## 2019-09-26 ENCOUNTER — Ambulatory Visit (INDEPENDENT_AMBULATORY_CARE_PROVIDER_SITE_OTHER): Payer: Medicare Other | Admitting: Family Medicine

## 2019-09-26 VITALS — BP 115/81 | HR 50 | Temp 97.5°F | Ht 62.0 in | Wt 189.0 lb

## 2019-09-26 DIAGNOSIS — G894 Chronic pain syndrome: Secondary | ICD-10-CM

## 2019-09-26 DIAGNOSIS — M7918 Myalgia, other site: Secondary | ICD-10-CM | POA: Diagnosis not present

## 2019-09-26 DIAGNOSIS — G43109 Migraine with aura, not intractable, without status migrainosus: Secondary | ICD-10-CM | POA: Diagnosis not present

## 2019-09-26 DIAGNOSIS — M797 Fibromyalgia: Secondary | ICD-10-CM | POA: Insufficient documentation

## 2019-09-26 DIAGNOSIS — M792 Neuralgia and neuritis, unspecified: Secondary | ICD-10-CM | POA: Insufficient documentation

## 2019-09-26 MED ORDER — OXYCODONE-ACETAMINOPHEN 5-325 MG PO TABS: 1.0000 | ORAL_TABLET | Freq: Four times a day (QID) | ORAL | 0 refills | Status: DC | PRN

## 2019-09-26 MED ORDER — IBUPROFEN 800 MG PO TABS: 800.0000 mg | ORAL_TABLET | Freq: Three times a day (TID) | ORAL | 5 refills | Status: DC | PRN

## 2019-09-26 NOTE — Patient Instructions (Signed)
Patient is a 74 yr old with Afib, CHF, CAD, and DM- with CKD- Stage III here for right low back pain with sciatica here for evaluation.   1. Take Skelaxin- no more than 4 days/week- so can take 3x/in a day, but no more than 4 days/week.   2. Theracane- 2-4 minutes of firm pressure- no massage- on tight muscles- can break up into days.  Most patients tell me they do something 3-4 days/week.    3. Patient here for trigger point injections for  Consent done and on chart.  Cleaned areas with alcohol and injected using a 27 gauge 1.5 inch needle  Injected  Using 1% Lidocaine with no EPI  Upper traps B/L Levators B/L Posterior scalenes B/L Middle scalenes Splenius Capitus Pectoralis Major B/L Rhomboids B/L Infraspinatus Teres Major/minor Thoracic paraspinals B/L x2 Lumbar paraspinals B/L x2 Other injections-    Patient's level of pain prior was 8/10 Current level of pain after injections is- 6/10-   There was no bleeding or complications.  Patient was advised to drink a lot of water on day after injections to flush system Will have increased soreness for 12-48 hours after injections.  Can use Lidocaine patches the day AFTER injections Can use theracane on day of injections in places didn't inject Can use heating pad 4-6 hours AFTER injections  4. Refilled Percocet 5/325 mg #120 no refills  5. F/U 4 weeks

## 2019-09-26 NOTE — Progress Notes (Signed)
Subjective:    Patient ID: Amber Stephenson, female    DOB: 04-18-1946, 74 y.o.   MRN: [id number]  HPI  Patient is a 74 yr old with Afib, CHF, CAD, and DM- with CKD- Stage III here for right low back pain with sciatica here for evaluation.   Ended up taking Skelaxin - working really well-  Not too sedating.  Working well.   Are taking 3x day daily-   Pt doesn't know if trigger injections worked.  Pt's husband said- was helpful, except for first day.  Thinks lasted a while.   Thinks pain overall has been better- this past month. Some spikes, but not like before.     Pain Inventory Average Pain 8 Pain Right Now 8 My pain is aching  In the last 24 hours, has pain interfered with the following? General activity 7 Relation with others 8 Enjoyment of life 2 What TIME of day is your pain at its worst? daytime Sleep (in general) Good  Pain is worse with: bending and sitting Pain improves with: heat/ice, therapy/exercise and TENS Relief from Meds: 0  Mobility ability to climb steps?  no do you drive?  no  Function retired  Neuro/Psych confusion  Prior Studies Any changes since last visit?  no  Physicians involved in your care Any changes since last visit?  no   Family History  Problem Relation Age of Onset  . Diabetes Mother   . Heart disease Mother   . Heart disease Brother   . Heart disease Sister        CABG  . Alcohol abuse Sister   . Mental illness Brother   . Diabetes Brother    Social History   Socioeconomic History  . Marital status: Married    Spouse name: Orpah Greek  . Number of children: 3  . Years of education: 29  . Highest education level: Associate degree: academic program  Occupational History  . Occupation: Retired  Tobacco Use  . Smoking status: Never Smoker  . Smokeless tobacco: Never Used  Substance and Sexual Activity  . Alcohol use: Yes    Alcohol/week: 0.0 standard drinks    Comment: once a week  . Drug use: No  . Sexual  activity: Yes    Partners: Male    Birth control/protection: Post-menopausal  Other Topics Concern  . Not on file  Social History Narrative   Lives at home with husband.       Right handed   Social Determinants of Health   Financial Resource Strain:   . Difficulty of Paying Living Expenses:   Food Insecurity:   . Worried About Charity fundraiser in the Last Year:   . Arboriculturist in the Last Year:   Transportation Needs:   . Film/video editor (Medical):   Marland Kitchen Lack of Transportation (Non-Medical):   Physical Activity:   . Days of Exercise per Week:   . Minutes of Exercise per Session:   Stress:   . Feeling of Stress :   Social Connections:   . Frequency of Communication with Friends and Family:   . Frequency of Social Gatherings with Friends and Family:   . Attends Religious Services:   . Active Member of Clubs or Organizations:   . Attends Archivist Meetings:   Marland Kitchen Marital Status:    Past Surgical History:  Procedure Laterality Date  . BREAST REDUCTION SURGERY    . EYE SURGERY Right    cateracts  .  HAMMER TOE SURGERY    . LEFT HEART CATH AND CORONARY ANGIOGRAPHY N/A 02/03/2018   Procedure: LEFT HEART CATH AND CORONARY ANGIOGRAPHY;  Surgeon: Martinique, Peter M, MD;  Location: [facility];  Service: Cardiovascular;  Laterality: N/A;  . REVERSE SHOULDER ARTHROPLASTY Right 08/19/2019   Procedure: REVERSE SHOULDER ARTHROPLASTY;  Surgeon: Netta Cedars, MD;  Location: [facility];  Service: Orthopedics;  Laterality: Right;  interscalene block  . SHOULDER SURGERY Right    "I BROKE MY SHOUDLER  . THIGH SURGERY     "TO REMOVE A TUMOR "   Past Medical History:  Diagnosis Date  . Atrial fibrillation with RVR 05/19/2017  . Bipolar I disorder 01/23/2015  . CAD (coronary artery disease)    Nonobstructive; Managed by Dr. Bronson Ing  . Cardiomegaly 01/12/2018  . Chronic anticoagulation 05/30/2015  . Chronic back pain    Lower back  . Chronic diastolic CHF  (congestive heart failure) 05/30/2015  . Diabetes mellitus, type 2, without complication   . Diabetic neuropathy 02/06/2016  . Dysrhythmia    A-Fib  . Essential hypertension   . Herpes genitalis in women 07/16/2015  . Hyperlipidemia   . Hypertension   . Hypoxia 02/01/2019  . Insomnia 01/23/2015  . Lipoma 02/08/2015  . Major depressive disorder   . Migraine headache with aura 02/12/2016  . 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 NAFL (nonalcoholic fatty liver) [id number]  . OSA (obstructive sleep apnea) 02/24/2019   10/09/2018 - HST  - AHI 40.6   . Pulmonary hypertension   . Renal insufficiency    Managed by Dr. Wallace Keller  . RLS (restless legs syndrome) 04/27/2015   BP 115/81   Pulse (!) 50   Temp (!) 97.5 F (36.4 C)   Ht 5\' 2"  (1.575 m)   Wt 189 lb (85.7 kg)   SpO2 95%   BMI 34.57 kg/m   Opioid Risk Score:   Fall Risk Score:  `1  Depression screen PHQ 2/9  Depression screen [facility] 2/9 08/29/2019 08/22/2019 08/08/2019 06/27/2019 06/06/2019 05/20/2019 04/25/2019  Decreased Interest 0 0 0 0 0 0 0  Down, Depressed, Hopeless 0 0 0 0 0 0 0  PHQ - 2 Score 0 0 0 0 0 0 0  Altered sleeping - 0 - 0 - - -  Tired, decreased energy - 3 - 0 - - -  Change in appetite - 0 - 0 - - -  Feeling bad or failure about yourself  - 0 - 0 - - -  Trouble concentrating - 0 - 0 - - -  Moving slowly or fidgety/restless - 0 - 0 - - -  Suicidal thoughts - 0 - 0 - - -  PHQ-9 Score - 3 - 0 - - -  Difficult doing work/chores - Somewhat difficult - - - - -  Some recent data might be hidden    Review of Systems  Constitutional: Positive for unexpected weight change.  HENT: Negative.   Eyes: Negative.   Respiratory: Negative.   Cardiovascular: Negative.   Gastrointestinal: Negative.   Endocrine: Negative.   Genitourinary: Negative.   Musculoskeletal: Positive for arthralgias.  Allergic/Immunologic: Negative.   Neurological: Negative.   Hematological: Negative.   Psychiatric/Behavioral: Positive  for confusion.  All other systems reviewed and are negative.      Objective:   Physical Exam  Awake, alert, appropriate, accompanied by husband, NAD Tight musculature- down entire spine- neck to sacrum      Assessment & Plan:  Patient is a 74 yr old with Afib, CHF, CAD, and DM- with CKD- Stage III here for right low back pain with sciatica here for evaluation.   1. Take Skelaxin- no more than 4 days/week- so can take 3x/in a day, but no more than 4 days/week.   2. Theracane- 2-4 minutes of firm pressure- no massage- on tight muscles- can break up into days.  Most patients tell me they do something 3-4 days/week.    3. Patient here for trigger point injections for  Consent done and on chart.  Cleaned areas with alcohol and injected using a 27 gauge 1.5 inch needle  Injected  Using 1% Lidocaine with no EPI  Upper traps B/L Levators B/L Posterior scalenes B/L Middle scalenes Splenius Capitus Pectoralis Major B/L Rhomboids B/L Infraspinatus Teres Major/minor Thoracic paraspinals B/L x2 Lumbar paraspinals B/L x2 Other injections-    Patient's level of pain prior was 8/10 Current level of pain after injections is- 6/10-   There was no bleeding or complications.  Patient was advised to drink a lot of water on day after injections to flush system Will have increased soreness for 12-48 hours after injections.  Can use Lidocaine patches the day AFTER injections Can use theracane on day of injections in places didn't inject Can use heating pad 4-6 hours AFTER injections  4. Refilled Percocet 5/325 mg #120 no refills  5. F/U 4 weeks  I spent a total of 35 minutes - 10 minutes on injections; 25 minutes on education.

## 2019-09-26 NOTE — Progress Notes (Signed)
Subjective:    Patient ID: Amber Stephenson, female    DOB: [birth year], 74 y.o.   MRN: [id number]   HPI: Amber Stephenson is a 74 y.o. female presenting for migraine.  She does not have one now..  She just needs to be sure her medication is available to her she uses Advil 800 mg three times a day as needed for her migraines.  She does have Imitrex.  She will use that if the ibuprofen is ineffective.   Depression screen [facility] 2/9 08/29/2019 08/22/2019 08/08/2019 06/27/2019 06/06/2019  Decreased Interest 0 0 0 0 0  Down, Depressed, Hopeless 0 0 0 0 0  PHQ - 2 Score 0 0 0 0 0  Altered sleeping - 0 - 0 -  Tired, decreased energy - 3 - 0 -  Change in appetite - 0 - 0 -  Feeling bad or failure about yourself  - 0 - 0 -  Trouble concentrating - 0 - 0 -  Moving slowly or fidgety/restless - 0 - 0 -  Suicidal thoughts - 0 - 0 -  PHQ-9 Score - 3 - 0 -  Difficult doing work/chores - Somewhat difficult - - -  Some recent data might be hidden     Relevant past medical, surgical, family and social history reviewed and updated as indicated.  Interim medical history since our last visit reviewed. Allergies and medications reviewed and updated.  ROS:  Review of Systems  Constitutional: Negative.   HENT: Negative.   Eyes: Negative for visual disturbance.  Respiratory: Negative for shortness of breath.   Cardiovascular: Negative for chest pain.  Gastrointestinal: Negative for abdominal pain.  Musculoskeletal: Positive for arthralgias and back pain.     Social History   Tobacco Use  Smoking Status Never Smoker  Smokeless Tobacco Never Used       Objective:     Wt Readings from Last 3 Encounters:  09/26/19 189 lb (85.7 kg)  09/12/19 197 lb (89.4 kg)  08/29/19 197 lb (89.4 kg)     Exam deferred. Pt. Harboring due to COVID 19. Phone visit performed.   Assessment & Plan:   1. Migraine with aura and without status migrainosus, not intractable   2. Chronic pain syndrome   3.  Myofascial pain dysfunction syndrome     Meds ordered this encounter  Medications  . ibuprofen (ADVIL) 800 MG tablet    Sig: Take 1 tablet (800 mg total) by mouth every 8 (eight) hours as needed.    Dispense:  30 tablet    Refill:  5    No orders of the defined types were placed in this encounter.     Diagnoses and all orders for this visit:  Migraine with aura and without status migrainosus, not intractable  Chronic pain syndrome  Myofascial pain dysfunction syndrome  Other orders -     ibuprofen (ADVIL) 800 MG tablet; Take 1 tablet (800 mg total) by mouth every 8 (eight) hours as needed.    Virtual Visit via telephone Note  I discussed the limitations, risks, security and privacy concerns of performing an evaluation and management service by telephone and the availability of in person appointments. The patient was identified with two identifiers. Pt.expressed understanding and agreed to proceed. Pt. Is at home. Dr. Livia Snellen is in his office.  Follow Up Instructions:   I discussed the assessment and treatment plan with the patient. The patient was provided an opportunity to ask questions and all were answered.  The patient agreed with the plan and demonstrated an understanding of the instructions.   The patient was advised to call back or seek an in-person evaluation if the symptoms worsen or if the condition fails to improve as anticipated.   Total minutes including chart review and phone contact time: 14   Follow up plan: Return if symptoms worsen or fail to improve.  [name], MD [place]

## 2019-09-29 DIAGNOSIS — Z794 Long term (current) use of insulin: Secondary | ICD-10-CM | POA: Diagnosis not present

## 2019-09-29 DIAGNOSIS — E1165 Type 2 diabetes mellitus with hyperglycemia: Secondary | ICD-10-CM | POA: Diagnosis not present

## 2019-09-29 DIAGNOSIS — E1140 Type 2 diabetes mellitus with diabetic neuropathy, unspecified: Secondary | ICD-10-CM | POA: Diagnosis not present

## 2019-09-30 ENCOUNTER — Ambulatory Visit (HOSPITAL_COMMUNITY): Payer: Medicare Other | Admitting: Psychiatry

## 2019-10-04 ENCOUNTER — Telehealth (INDEPENDENT_AMBULATORY_CARE_PROVIDER_SITE_OTHER): Payer: Medicare Other | Admitting: Psychiatry

## 2019-10-04 ENCOUNTER — Telehealth (HOSPITAL_COMMUNITY): Payer: Self-pay

## 2019-10-04 ENCOUNTER — Other Ambulatory Visit: Payer: Self-pay

## 2019-10-04 ENCOUNTER — Ambulatory Visit: Payer: Medicare Other | Admitting: Family Medicine

## 2019-10-04 DIAGNOSIS — F3130 Bipolar disorder, current episode depressed, mild or moderate severity, unspecified: Secondary | ICD-10-CM | POA: Diagnosis not present

## 2019-10-04 MED ORDER — ARIPIPRAZOLE 10 MG PO TABS: 10.0000 mg | ORAL_TABLET | Freq: Once | ORAL | 4 refills | Status: DC

## 2019-10-04 MED ORDER — CARBAMAZEPINE ER 100 MG PO CP12: 100.0000 mg | ORAL_CAPSULE | Freq: Every day | ORAL | 1 refills | Status: DC

## 2019-10-04 MED ORDER — TRAZODONE HCL 100 MG PO TABS: 400.0000 mg | ORAL_TABLET | Freq: Every day | ORAL | 1 refills | Status: DC

## 2019-10-04 MED ORDER — LORAZEPAM 1 MG PO TABS: 1.0000 mg | ORAL_TABLET | ORAL | 4 refills | Status: DC

## 2019-10-04 MED ORDER — PREGABALIN 200 MG PO CAPS: 200.0000 mg | ORAL_CAPSULE | Freq: Two times a day (BID) | ORAL | 3 refills | Status: DC

## 2019-10-04 NOTE — Telephone Encounter (Signed)
Received prior authorization request for Trazodone 100mg  tablets. Rx is for 400mg  total at bedtime. Per most recent note, "She takes 3 trazodone to sleep and doesn't stay asleep. At this time she'll continue taking the trazodone but she'll begin on doxepin 50 mg in addition".  Please clarify if pt is supposed to be on 300 or 400mg  of trazodone.

## 2019-10-04 NOTE — Progress Notes (Signed)
Patient ID: Amber Stephenson, female   DOB: 17-May-1946, 74 y.o.   MRN: [id number] [facility] MD Progress Note .she's going to apply 10/04/2019 1:59 PM Amber Stephenson  MRN:  [id number] Subjective:  Doing well Principal Problem: Bipolar disorder manic type Diagnosis: Bipolar disorder  Today the patient is doing well.  She does describe herself as being a little grumpy but describes it as a phase.  She feels grumpy to a lot of people but particularly towards her husband who she has been married to over 78 years.  The only other stressor is she recently had shoulder replacement.  This is much better now her pain is less.  She is sleeping and eating well.  She is got good energy.  She has no problems thinking and concentrating.  She describes mild irritability but does not get in her way.  She has no euphoria.  She has no racing thinking she is not spending excessively she doing nothing dangerous or risky.  She drinks no alcohol uses no drugs.  She has no psychosis.  There is no evidence of grandiosity.  The patient takes her medicines just as prescribed.  She denies any side effects from her medications.  Patient had her vaccines.  She seems to be functioning actually very well.  She has no significant mood symptomatology at this time.  Past Medical History:  Diagnosis Date  . Atrial fibrillation with RVR 05/19/2017  . Bipolar I disorder 01/23/2015  . CAD (coronary artery disease)    Nonobstructive; Managed by Dr. Bronson Ing  . Cardiomegaly 01/12/2018  . Chronic anticoagulation 05/30/2015  . Chronic back pain    Lower back  . Chronic diastolic CHF (congestive heart failure) 05/30/2015  . Diabetes mellitus, type 2, without complication   . Diabetic neuropathy 02/06/2016  . Dysrhythmia    A-Fib  . Essential hypertension   . Herpes genitalis in women 07/16/2015  . Hyperlipidemia   . Hypertension   . Hypoxia 02/01/2019  . Insomnia 01/23/2015  . Lipoma 02/08/2015  . Major depressive disorder   . Migraine  headache with aura 02/12/2016  . 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[place]) 04/21/2019  . NAFL (nonalcoholic fatty liver) 123XX123  . OSA (obstructive sleep apnea) 02/24/2019   10/09/2018 - HST  - AHI 40.6   . Pulmonary hypertension   . Renal insufficiency    Managed by Dr. Wallace Keller  . RLS (restless legs syndrome) 04/27/2015    Past Surgical History:  Procedure Laterality Date  . BREAST REDUCTION SURGERY    . EYE SURGERY Right    cateracts  . HAMMER TOE SURGERY    . LEFT HEART CATH AND CORONARY ANGIOGRAPHY N/A 02/03/2018   Procedure: LEFT HEART CATH AND CORONARY ANGIOGRAPHY;  Surgeon: Martinique, Peter M, MD;  Location: [place] [facility];  Service: Cardiovascular;  Laterality: N/A;  . REVERSE SHOULDER ARTHROPLASTY Right 08/19/2019   Procedure: REVERSE SHOULDER ARTHROPLASTY;  Surgeon: Netta Cedars, MD;  Location: [facility];  Service: Orthopedics;  Laterality: Right;  interscalene block  . SHOULDER SURGERY Right    "I BROKE MY SHOUDLER  . THIGH SURGERY     "TO REMOVE A TUMOR "   Family History:  Family History  Problem Relation Age of Onset  . Diabetes Mother   . Heart disease Mother   . Heart disease Brother   . Heart disease Sister        CABG  . Alcohol abuse Sister   . Mental illness Brother   . Diabetes Brother  Family Psychiatric  History:  Social History:  Social History   Substance and Sexual Activity  Alcohol Use Yes  . Alcohol/week: 0.0 standard drinks   Comment: once a week     Social History   Substance and Sexual Activity  Drug Use No    Social History   Socioeconomic History  . Marital status: Married    Spouse name: Orpah Greek  . Number of children: 3  . Years of education: 39  . Highest education level: Associate degree: academic program  Occupational History  . Occupation: Retired  Tobacco Use  . Smoking status: Never Smoker  . Smokeless tobacco: Never Used  Substance and Sexual Activity  . Alcohol use: Yes    Alcohol/week: 0.0 standard drinks     Comment: once a week  . Drug use: No  . Sexual activity: Yes    Partners: Male    Birth control/protection: Post-menopausal  Other Topics Concern  . Not on file  Social History Narrative   Lives at home with husband.       Right handed   Social Determinants of Health   Financial Resource Strain:   . Difficulty of Paying Living Expenses:   Food Insecurity:   . Worried About Charity fundraiser in the Last Year:   . Arboriculturist in the Last Year:   Transportation Needs:   . Film/video editor (Medical):   Marland Kitchen Lack of Transportation (Non-Medical):   Physical Activity:   . Days of Exercise per Week:   . Minutes of Exercise per Session:   Stress:   . Feeling of Stress :   Social Connections:   . Frequency of Communication with Friends and Family:   . Frequency of Social Gatherings with Friends and Family:   . Attends Religious Services:   . Active Member of Clubs or Organizations:   . Attends Archivist Meetings:   Marland Kitchen Marital Status:    Additional Social History:                         Sleep: Good  Appetite:  Good  Current Medications: Current Outpatient Medications  Medication Sig Dispense Refill  . acetaminophen (TYLENOL) 325 MG tablet Take 325-650 mg by mouth every 6 (six) hours as needed for mild pain, moderate pain or headache.    . albuterol (PROVENTIL) (2.5 MG/3ML) 0.083% nebulizer solution Take 3 mLs (2.5 mg total) by nebulization every 6 (six) hours as needed for wheezing or shortness of breath. 150 mL 1  . ARIPiprazole (ABILIFY) 10 MG tablet Take 1 tablet (10 mg total) by mouth 2 (two) times daily. (Patient taking differently: Take 10 mg by mouth once. ) 90 tablet 5  . atorvastatin (LIPITOR) 40 MG tablet Take 1 tablet (40 mg total) by mouth daily. 90 tablet 3  . budesonide-formoterol (SYMBICORT) 80-4.5 MCG/ACT inhaler Inhale 2 puffs into the lungs 2 (two) times daily. 2 Inhaler 5  . carbamazepine (CARBATROL) 100 MG 12 hr capsule Take 1  capsule (100 mg total) by mouth daily. 90 capsule 1  . Cholecalciferol (VITAMIN D3) 1000 UNITS CAPS Take 1,000 Units by mouth daily.    Marland Kitchen diltiazem (CARDIZEM CD) 120 MG 24 hr capsule TAKE 1 CAPSULE AT BEDTIME 90 capsule 3  . ELIQUIS 5 MG TABS tablet TAKE 1 TABLET TWICE A DAY (Patient taking differently: Take 5 mg by mouth 2 (two) times daily. ) 180 tablet 3  . Eszopiclone (ESZOPICLONE) 3  MG TABS Take 1 tablet (3 mg total) by mouth at bedtime as needed. Take immediately before bedtime (Patient taking differently: Take 3 mg by mouth at bedtime as needed (sleep). Take immediately before bedtime) 90 tablet 1  . furosemide (LASIX) 40 MG tablet Take 0.5 tablets (20 mg total) by mouth every morning. Weigh self daily, take whole tablet (40 mg) if you gain 3 lbs or more overnite 90 tablet 1  . ibuprofen (ADVIL) 800 MG tablet Take 1 tablet (800 mg total) by mouth every 8 (eight) hours as needed. 30 tablet 5  . Insulin Glargine (LANTUS SOLOSTAR) 100 UNIT/ML Solostar Pen INJECT 60 UNITS UNDER THE SKIN TWICE A DAY (Patient taking differently: Inject 60 Units into the skin 2 (two) times daily. ) 90 mL 0  . LORazepam (ATIVAN) 1 MG tablet Take 1-2 tablets (1-2 mg total) by mouth See admin instructions. Take 1 tab (1 mg) in the morning and 2 tabs (2 mg ) at bedtime 90 tablet 4  . metaxalone (SKELAXIN) 800 MG tablet Take 1 tablet (800 mg total) by mouth 3 (three) times daily as needed for muscle spasms. 90 tablet 3  . metoprolol tartrate (LOPRESSOR) 100 MG tablet Take 1 tablet (100 mg total) by mouth 2 (two) times daily. Take 100mg  + 25mg  for a total of 125mg  twice a day.    . metoprolol tartrate (LOPRESSOR) 25 MG tablet TAKE 1 TABLET TWICE A DAY (TO BE TAKEN ALONG WITH THE LOPRESSOR 100 MG TWICE A DAY) 180 tablet 1  . naloxone (NARCAN) nasal spray 4 mg/0.1 mL Place 1 spray into the nose once as needed (opiod overdose).    Marland Kitchen oxyCODONE-acetaminophen (PERCOCET) 5-325 MG tablet Take 1 tablet by mouth every 6 (six) hours as  needed for severe pain. 120 tablet 0  . oxyCODONE-acetaminophen (PERCOCET/ROXICET) 5-325 MG tablet Take 1 tablet by mouth 4 (four) times daily as needed (pain.). 30 tablet 0  . potassium chloride SA (KLOR-CON) 20 MEQ tablet Take 1 tablet (20 mEq total) by mouth daily. (Patient taking differently: Take 20 mEq by mouth every evening. ) 90 tablet 3  . pregabalin (LYRICA) 200 MG capsule Take 1 capsule (200 mg total) by mouth 2 (two) times daily. 60 capsule 3  . PROAIR HFA 108 (90 Base) MCG/ACT inhaler Inhale 1-2 puffs into the lungs every 6 (six) hours as needed. As needed 18 g 11  . Semaglutide (RYBELSUS) 7 MG TABS Take 7 mg by mouth daily.     . SUMAtriptan (IMITREX) 100 MG tablet Take 1 tablet (100 mg total) by mouth every 2 (two) hours as needed for migraine. May repeat in 2 hours if headache persists or recurs. 10 tablet 5  . topiramate (TOPAMAX) 25 MG tablet Take 1 tablet in AM, 2 tablets every night 120 tablet 11  . traZODone (DESYREL) 100 MG tablet Take 4 tablets (400 mg total) by mouth at bedtime. 360 tablet 1  . triamcinolone ointment (KENALOG) 0.5 % Apply 1 application topically 2 (two) times daily. (Patient taking differently: Apply 1 application topically 2 (two) times daily as needed (skin irritation/rash.). ) 30 g 0  . VASCEPA 1 g CAPS TAKE 2 CAPSULES TWICE A DAY WITH MEALS (Patient taking differently: Take 2 g by mouth in the morning and at bedtime. ) 360 capsule 3   No current facility-administered medications for this visit.   Facility-Administered Medications Ordered in Other Visits  Medication Dose Route Frequency Provider Last Rate Last Admin  . bupivacaine-epinephrine (MARCAINE W/ EPI)  0.5% -1:200000 injection    Anesthesia Intra-op Effie Berkshire, MD   12 mL at 01/21/19 0935    Lab Results: No results found for this or any previous visit (from the past 48 hour(s)).  Blood Alcohol level:  Lab Results  Component Value Date   ETH <5 10/01/2015    Physical Findings: AIMS:   , ,  ,  ,    CIWA:    COWS:     Musculoskeletal: Strength & Muscle Tone: within normal limits Gait & Station: normal Patient leans: N/A  Psychiatric Specialty Exam: ROS Patient ID: Amber Stephenson, female   DOB: 1946/05/07, 60 y.o.   MRN: [id number] [facility] MD Progress Note .she's going to apply 10/04/2019 1:59 PM Amber Stephenson  MRN:  [id number] Subjective:  Doing well Principal Problem: Bipolar disorder most recent episode manic Diagnosis:  Bipolar disorder most recent   This patient's first problem is that of bipolar disorder.  It is relatively well controlled.  Patient takes 30 mg of Abilify and Tegretol 100 mg 1 in the morning.  She also takes Lyrica 150 mg twice daily.  For sleep the patient takes trazodone.  Her second problem is that of insomnia.  Patient is functioning very well.  She will return to see me in 2 to 3 months. Past Medical History:  Diagnosis Date  . Atrial fibrillation with RVR 05/19/2017  . Bipolar I disorder 01/23/2015  . CAD (coronary artery disease)    Nonobstructive; Managed by Dr. Bronson Ing  . Cardiomegaly 01/12/2018  . Chronic anticoagulation 05/30/2015  . Chronic back pain    Lower back  . Chronic diastolic CHF (congestive heart failure) 05/30/2015  . Diabetes mellitus, type 2, without complication   . Diabetic neuropathy 02/06/2016  . Dysrhythmia    A-Fib  . Essential hypertension   . Herpes genitalis in women 07/16/2015  . Hyperlipidemia   . Hypertension   . Hypoxia 02/01/2019  . Insomnia 01/23/2015  . Lipoma 02/08/2015  . Major depressive disorder   . Migraine headache with aura 02/12/2016  . 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[place]) 04/21/2019  . NAFL (nonalcoholic fatty liver) 123XX123  . OSA (obstructive sleep apnea) 02/24/2019   10/09/2018 - HST  - AHI 40.6   . Pulmonary hypertension   . Renal insufficiency    Managed by Dr. Wallace Keller  . RLS (restless legs syndrome) 04/27/2015    Past Surgical History:  Procedure Laterality Date  .  BREAST REDUCTION SURGERY    . EYE SURGERY Right    cateracts  . HAMMER TOE SURGERY    . LEFT HEART CATH AND CORONARY ANGIOGRAPHY N/A 02/03/2018   Procedure: LEFT HEART CATH AND CORONARY ANGIOGRAPHY;  Surgeon: Martinique, Peter M, MD;  Location: [place] [facility];  Service: Cardiovascular;  Laterality: N/A;  . REVERSE SHOULDER ARTHROPLASTY Right 08/19/2019   Procedure: REVERSE SHOULDER ARTHROPLASTY;  Surgeon: Netta Cedars, MD;  Location: [facility];  Service: Orthopedics;  Laterality: Right;  interscalene block  . SHOULDER SURGERY Right    "I BROKE MY SHOUDLER  . THIGH SURGERY     "TO REMOVE A TUMOR "   Family History:  Family History  Problem Relation Age of Onset  . Diabetes Mother   . Heart disease Mother   . Heart disease Brother   . Heart disease Sister        CABG  . Alcohol abuse Sister   . Mental illness Brother   . Diabetes Brother    Family Psychiatric  History:  Social History:  Social History   Substance and Sexual Activity  Alcohol Use Yes  . Alcohol/week: 0.0 standard drinks   Comment: once a week     Social History   Substance and Sexual Activity  Drug Use No    Social History   Socioeconomic History  . Marital status: Married    Spouse name: Orpah Greek  . Number of children: 3  . Years of education: 77  . Highest education level: Associate degree: academic program  Occupational History  . Occupation: Retired  Tobacco Use  . Smoking status: Never Smoker  . Smokeless tobacco: Never Used  Substance and Sexual Activity  . Alcohol use: Yes    Alcohol/week: 0.0 standard drinks    Comment: once a week  . Drug use: No  . Sexual activity: Yes    Partners: Male    Birth control/protection: Post-menopausal  Other Topics Concern  . Not on file  Social History Narrative   Lives at home with husband.       Right handed   Social Determinants of Health   Financial Resource Strain:   . Difficulty of Paying Living Expenses:   Food Insecurity:   . Worried About  Charity fundraiser in the Last Year:   . Arboriculturist in the Last Year:   Transportation Needs:   . Film/video editor (Medical):   Marland Kitchen Lack of Transportation (Non-Medical):   Physical Activity:   . Days of Exercise per Week:   . Minutes of Exercise per Session:   Stress:   . Feeling of Stress :   Social Connections:   . Frequency of Communication with Friends and Family:   . Frequency of Social Gatherings with Friends and Family:   . Attends Religious Services:   . Active Member of Clubs or Organizations:   . Attends Archivist Meetings:   Marland Kitchen Marital Status:    Additional Social History:                         Sleep: Good  Appetite:  Good  Current Medications: Current Outpatient Medications  Medication Sig Dispense Refill  . acetaminophen (TYLENOL) 325 MG tablet Take 325-650 mg by mouth every 6 (six) hours as needed for mild pain, moderate pain or headache.    . albuterol (PROVENTIL) (2.5 MG/3ML) 0.083% nebulizer solution Take 3 mLs (2.5 mg total) by nebulization every 6 (six) hours as needed for wheezing or shortness of breath. 150 mL 1  . ARIPiprazole (ABILIFY) 10 MG tablet Take 1 tablet (10 mg total) by mouth 2 (two) times daily. (Patient taking differently: Take 10 mg by mouth once. ) 90 tablet 5  . atorvastatin (LIPITOR) 40 MG tablet Take 1 tablet (40 mg total) by mouth daily. 90 tablet 3  . budesonide-formoterol (SYMBICORT) 80-4.5 MCG/ACT inhaler Inhale 2 puffs into the lungs 2 (two) times daily. 2 Inhaler 5  . carbamazepine (CARBATROL) 100 MG 12 hr capsule Take 1 capsule (100 mg total) by mouth daily. 90 capsule 1  . Cholecalciferol (VITAMIN D3) 1000 UNITS CAPS Take 1,000 Units by mouth daily.    Marland Kitchen diltiazem (CARDIZEM CD) 120 MG 24 hr capsule TAKE 1 CAPSULE AT BEDTIME 90 capsule 3  . ELIQUIS 5 MG TABS tablet TAKE 1 TABLET TWICE A DAY (Patient taking differently: Take 5 mg by mouth 2 (two) times daily. ) 180 tablet 3  . Eszopiclone (ESZOPICLONE) 3  MG TABS Take  1 tablet (3 mg total) by mouth at bedtime as needed. Take immediately before bedtime (Patient taking differently: Take 3 mg by mouth at bedtime as needed (sleep). Take immediately before bedtime) 90 tablet 1  . furosemide (LASIX) 40 MG tablet Take 0.5 tablets (20 mg total) by mouth every morning. Weigh self daily, take whole tablet (40 mg) if you gain 3 lbs or more overnite 90 tablet 1  . ibuprofen (ADVIL) 800 MG tablet Take 1 tablet (800 mg total) by mouth every 8 (eight) hours as needed. 30 tablet 5  . Insulin Glargine (LANTUS SOLOSTAR) 100 UNIT/ML Solostar Pen INJECT 60 UNITS UNDER THE SKIN TWICE A DAY (Patient taking differently: Inject 60 Units into the skin 2 (two) times daily. ) 90 mL 0  . LORazepam (ATIVAN) 1 MG tablet Take 1-2 tablets (1-2 mg total) by mouth See admin instructions. Take 1 tab (1 mg) in the morning and 2 tabs (2 mg ) at bedtime 90 tablet 4  . metaxalone (SKELAXIN) 800 MG tablet Take 1 tablet (800 mg total) by mouth 3 (three) times daily as needed for muscle spasms. 90 tablet 3  . metoprolol tartrate (LOPRESSOR) 100 MG tablet Take 1 tablet (100 mg total) by mouth 2 (two) times daily. Take 100mg  + 25mg  for a total of 125mg  twice a day.    . metoprolol tartrate (LOPRESSOR) 25 MG tablet TAKE 1 TABLET TWICE A DAY (TO BE TAKEN ALONG WITH THE LOPRESSOR 100 MG TWICE A DAY) 180 tablet 1  . naloxone (NARCAN) nasal spray 4 mg/0.1 mL Place 1 spray into the nose once as needed (opiod overdose).    Marland Kitchen oxyCODONE-acetaminophen (PERCOCET) 5-325 MG tablet Take 1 tablet by mouth every 6 (six) hours as needed for severe pain. 120 tablet 0  . oxyCODONE-acetaminophen (PERCOCET/ROXICET) 5-325 MG tablet Take 1 tablet by mouth 4 (four) times daily as needed (pain.). 30 tablet 0  . potassium chloride SA (KLOR-CON) 20 MEQ tablet Take 1 tablet (20 mEq total) by mouth daily. (Patient taking differently: Take 20 mEq by mouth every evening. ) 90 tablet 3  . pregabalin (LYRICA) 200 MG capsule Take 1  capsule (200 mg total) by mouth 2 (two) times daily. 60 capsule 3  . PROAIR HFA 108 (90 Base) MCG/ACT inhaler Inhale 1-2 puffs into the lungs every 6 (six) hours as needed. As needed 18 g 11  . Semaglutide (RYBELSUS) 7 MG TABS Take 7 mg by mouth daily.     . SUMAtriptan (IMITREX) 100 MG tablet Take 1 tablet (100 mg total) by mouth every 2 (two) hours as needed for migraine. May repeat in 2 hours if headache persists or recurs. 10 tablet 5  . topiramate (TOPAMAX) 25 MG tablet Take 1 tablet in AM, 2 tablets every night 120 tablet 11  . traZODone (DESYREL) 100 MG tablet Take 4 tablets (400 mg total) by mouth at bedtime. 360 tablet 1  . triamcinolone ointment (KENALOG) 0.5 % Apply 1 application topically 2 (two) times daily. (Patient taking differently: Apply 1 application topically 2 (two) times daily as needed (skin irritation/rash.). ) 30 g 0  . VASCEPA 1 g CAPS TAKE 2 CAPSULES TWICE A DAY WITH MEALS (Patient taking differently: Take 2 g by mouth in the morning and at bedtime. ) 360 capsule 3   No current facility-administered medications for this visit.   Facility-Administered Medications Ordered in Other Visits  Medication Dose Route Frequency Provider Last Rate Last Admin  . bupivacaine-epinephrine (MARCAINE W/ EPI) 0.5% -1:200000 injection  Anesthesia Intra-op Effie Berkshire, MD   12 mL at 01/21/19 0935    Lab Results: No results found for this or any previous visit (from the past 48 hour(s)).  Blood Alcohol level:  Lab Results  Component Value Date   ETH <5 10/01/2015    Physical Findings: AIMS:  , ,  ,  ,    CIWA:    COWS:     Musculoskeletal: Strength & Muscle Tone: within normal limits Gait & Station: normal Patient leans: N/A  Psychiatric Specialty Exam: ROS  There were no vitals taken for this visit.There is no height or weight on file to calculate BMI.  General Appearance: Casual  Eye Contact::  Good  Speech:  Clear and Coherent  Volume:  Normal  Mood:  Euthymic   Affect:  Appropriate  Thought Process:  Coherent  Orientation:  Full (Time, Place, and Person)  Thought Content:  WDL  Suicidal Thoughts:  No  Homicidal Thoughts:  No  Memory:  NA  Judgement:  Good  Insight:  Good  Psychomotor Activity:  Normal  Concentration:  Good  Recall:  Good  Fund of Knowledge:Good  Language: Good  Akathisia:  No  Handed:  Right  AIMS (if indicated):     Assets: Good spirits   ADL's:  Intact  Cognition: WNL  Sleep:     eks Treatment Plan Summary: This patient's first admission important problem is bipolar disorder. She takes multiple medications for this condition. This includes Tegretol and Abilify. At this time the patient is not in therapy. She'll make an attempt to get a therapist in her community. She's taking a moderate to high dose of Ativan 1 mg one in the morning and 2 at night and requested more of it. At this time we will not increase the dose of Ativan. We will have her response to her second problem which is her problems sleeping. She takes 3 trazodone to sleep and doesn't stay asleep. At this time she'll continue taking the trazodone but she'll begin on doxepin 50 mg in addition. Today we spent more than 50% of the time talking about her medications and educating her. She understands that she cannot drink alcohol with her medications and she'll actually does not. The patient stays busy and active. She does importance of being in therapy and she'll actively looking for a therapist at this time. She'll return to see me in 3 months for a 30 minute visit. At that time consider getting a Tegretol blood level and other blood work.patient denies any physical complaints of chest pain shortness of breath.  There were no vitals taken for this visit.There is no height or weight on file to calculate BMI.  General Appearance: Casual  Eye Contact::  Good  Speech:  Clear and Coherent  Volume:  Normal  Mood:  Euthymic  Affect:  Appropriate  Thought Process:   Coherent  Orientation:  Full (Time, Place, and Person)  Thought Content:  WDL  Suicidal Thoughts:  No  Homicidal Thoughts:  No  Memory:  NA  Judgement:  Good  Insight:  Good  Psychomotor Activity:  Normal  Concentration:  Good  Recall:  Good  Fund of Knowledge:Good  Language: Good  Akathisia:  No  Handed:  Right  AIMS (if indicated):     Assets: Good spirits   ADL's:  Intact  Cognition: WNL  Sleep:     eks Treatment Plan Summary:  Today the patient is doing well.  It is noted that  she is not had any blood work in a while.  We will go ahead and make arranges for her to get it at [facility].  The patient will continue taking Tegretol 100 mg once a day.  This is for her first problem of bipolar disorder.  At this time she is doing great.  Should continue taking her Abilify as prescribed as well.  Her second problem is that of insomnia.  Should continue taking trazodone which we will increase to 100 mg for today.  She no longer takes doxepin.  Patient will continue taking Ativan 1 mg 1 in the morning and 2 at night.  I suspect overall this helps both her bipolar state as well as her insomnia.  Patient will also continue taking Lunesta 3 mg as prescribed.  This patient will be seen again in 3 months.  I believe she is very stable.  She is functioning well.  The blood work the patient needs is a morning Tegretol level, comprehensive metabolic panel and a CBC.

## 2019-10-06 ENCOUNTER — Telehealth: Payer: Self-pay

## 2019-10-06 MED ORDER — FUROSEMIDE 40 MG PO TABS: 40.0000 mg | ORAL_TABLET | Freq: Every day | ORAL | 1 refills | Status: DC

## 2019-10-06 NOTE — Telephone Encounter (Signed)
Pt states medication changes made are not working. [id number]  Please call  [phone]   Thanks renee

## 2019-10-06 NOTE — Telephone Encounter (Signed)
Please refer to my last office note.  She requested reducing the dose of Lasix.  I would support her going back to 40 mg daily.

## 2019-10-06 NOTE — Telephone Encounter (Signed)
Pt's  spouse says she urinates every 15 minutes.I urged him to see pcp and get a U/A.He states they would call pcp.I asked them to return to lasix 40 mg qd

## 2019-10-06 NOTE — Telephone Encounter (Signed)
Patient calling with SOB after taking reduced dose of lasix 20 mg daily.She has not gained any weight. Was taking 40 mg daily

## 2019-10-10 ENCOUNTER — Ambulatory Visit (INDEPENDENT_AMBULATORY_CARE_PROVIDER_SITE_OTHER): Payer: Medicare Other | Admitting: Family Medicine

## 2019-10-10 ENCOUNTER — Encounter: Payer: Self-pay | Admitting: Family Medicine

## 2019-10-10 DIAGNOSIS — R399 Unspecified symptoms and signs involving the genitourinary system: Secondary | ICD-10-CM

## 2019-10-10 MED ORDER — DOXYCYCLINE HYCLATE 100 MG PO TABS: 100.0000 mg | ORAL_TABLET | Freq: Two times a day (BID) | ORAL | 0 refills | Status: DC

## 2019-10-10 NOTE — Progress Notes (Signed)
Virtual Visit via telephone Note  I connected with Amber Stephenson on 10/10/19 at 1800 by telephone and verified that I am speaking with the correct person using two identifiers. Amber Stephenson is currently located at home and no other people are currently with her during visit. The provider, Fransisca Kaufmann Quintan Saldivar, MD is located in their office at time of visit.  Call ended at 1808  I discussed the limitations, risks, security and privacy concerns of performing an evaluation and management service by telephone and the availability of in person appointments. I also discussed with the patient that there may be a patient responsible charge related to this service. The patient expressed understanding and agreed to proceed.   History and Present Illness: Patient is calling in for 4 days of urinary frequency and was having frequent accidents and says she was confused and then she increased lasix to 40 mg and now is having flank pain on her left side.  She denies fevers or chills.  She is better on frequency and denies hematuria.   1. UTI symptoms     Outpatient Encounter Medications as of 10/10/2019  Medication Sig  . acetaminophen (TYLENOL) 325 MG tablet Take 325-650 mg by mouth every 6 (six) hours as needed for mild pain, moderate pain or headache.  . albuterol (PROVENTIL) (2.5 MG/3ML) 0.083% nebulizer solution Take 3 mLs (2.5 mg total) by nebulization every 6 (six) hours as needed for wheezing or shortness of breath.  . ARIPiprazole (ABILIFY) 10 MG tablet Take 1 tablet (10 mg total) by mouth once for 1 dose.  Marland Kitchen atorvastatin (LIPITOR) 40 MG tablet Take 1 tablet (40 mg total) by mouth daily.  . budesonide-formoterol (SYMBICORT) 80-4.5 MCG/ACT inhaler Inhale 2 puffs into the lungs 2 (two) times daily.  . carbamazepine (CARBATROL) 100 MG 12 hr capsule Take 1 capsule (100 mg total) by mouth daily.  . Cholecalciferol (VITAMIN D3) 1000 UNITS CAPS Take 1,000 Units by mouth daily.  Marland Kitchen diltiazem  (CARDIZEM CD) 120 MG 24 hr capsule TAKE 1 CAPSULE AT BEDTIME  . doxycycline (VIBRA-TABS) 100 MG tablet Take 1 tablet (100 mg total) by mouth 2 (two) times daily. 1 po bid  . ELIQUIS 5 MG TABS tablet TAKE 1 TABLET TWICE A DAY (Patient taking differently: Take 5 mg by mouth 2 (two) times daily. )  . Eszopiclone (ESZOPICLONE) 3 MG TABS Take 1 tablet (3 mg total) by mouth at bedtime as needed. Take immediately before bedtime (Patient taking differently: Take 3 mg by mouth at bedtime as needed (sleep). Take immediately before bedtime)  . furosemide (LASIX) 40 MG tablet Take 1 tablet (40 mg total) by mouth daily. Weigh self daily, take whole tablet (40 mg) if you gain 3 lbs or more overnite  . ibuprofen (ADVIL) 800 MG tablet Take 1 tablet (800 mg total) by mouth every 8 (eight) hours as needed.  . Insulin Glargine (LANTUS SOLOSTAR) 100 UNIT/ML Solostar Pen INJECT 60 UNITS UNDER THE SKIN TWICE A DAY (Patient taking differently: Inject 60 Units into the skin 2 (two) times daily. )  . LORazepam (ATIVAN) 1 MG tablet Take 1-2 tablets (1-2 mg total) by mouth See admin instructions. Take 1 tab (1 mg) in the morning and 2 tabs (2 mg ) at bedtime  . metaxalone (SKELAXIN) 800 MG tablet Take 1 tablet (800 mg total) by mouth 3 (three) times daily as needed for muscle spasms.  . metoprolol tartrate (LOPRESSOR) 100 MG tablet Take 1 tablet (100 mg total) by  mouth 2 (two) times daily. Take 100mg  + 25mg  for a total of 125mg  twice a day.  . metoprolol tartrate (LOPRESSOR) 25 MG tablet TAKE 1 TABLET TWICE A DAY (TO BE TAKEN ALONG WITH THE LOPRESSOR 100 MG TWICE A DAY)  . naloxone (NARCAN) nasal spray 4 mg/0.1 mL Place 1 spray into the nose once as needed (opiod overdose).  Marland Kitchen oxyCODONE-acetaminophen (PERCOCET) 5-325 MG tablet Take 1 tablet by mouth every 6 (six) hours as needed for severe pain.  Marland Kitchen oxyCODONE-acetaminophen (PERCOCET/ROXICET) 5-325 MG tablet Take 1 tablet by mouth 4 (four) times daily as needed (pain.).  Marland Kitchen  potassium chloride SA (KLOR-CON) 20 MEQ tablet Take 1 tablet (20 mEq total) by mouth daily. (Patient taking differently: Take 20 mEq by mouth every evening. )  . pregabalin (LYRICA) 200 MG capsule Take 1 capsule (200 mg total) by mouth 2 (two) times daily.  Marland Kitchen PROAIR HFA 108 (90 Base) MCG/ACT inhaler Inhale 1-2 puffs into the lungs every 6 (six) hours as needed. As needed  . Semaglutide (RYBELSUS) 7 MG TABS Take 7 mg by mouth daily.   . SUMAtriptan (IMITREX) 100 MG tablet Take 1 tablet (100 mg total) by mouth every 2 (two) hours as needed for migraine. May repeat in 2 hours if headache persists or recurs.  . topiramate (TOPAMAX) 25 MG tablet Take 1 tablet in AM, 2 tablets every night  . traZODone (DESYREL) 100 MG tablet Take 4 tablets (400 mg total) by mouth at bedtime.  . triamcinolone ointment (KENALOG) 0.5 % Apply 1 application topically 2 (two) times daily. (Patient taking differently: Apply 1 application topically 2 (two) times daily as needed (skin irritation/rash.). )  . VASCEPA 1 g CAPS TAKE 2 CAPSULES TWICE A DAY WITH MEALS (Patient taking differently: Take 2 g by mouth in the morning and at bedtime. )   Facility-Administered Encounter Medications as of 10/10/2019  Medication  . bupivacaine-epinephrine (MARCAINE W/ EPI) 0.5% -1:200000 injection    Review of Systems  Constitutional: Negative for chills and fever.  Eyes: Negative for visual disturbance.  Respiratory: Negative for chest tightness and shortness of breath.   Cardiovascular: Negative for chest pain and leg swelling.  Gastrointestinal: Negative for abdominal pain.  Genitourinary: Positive for flank pain, frequency and urgency. Negative for difficulty urinating, dysuria, hematuria, vaginal bleeding, vaginal discharge and vaginal pain.  Musculoskeletal: Negative for back pain and gait problem.  Skin: Negative for rash.  Neurological: Negative for light-headedness and headaches.  Psychiatric/Behavioral: Negative for agitation  and behavioral problems.  All other systems reviewed and are negative.   Observations/Objective: Patient sounds comfortable and in no acute distress  Assessment and Plan: Problem List Items Addressed This Visit    None    Visit Diagnoses    UTI symptoms    -  Primary   Relevant Medications   doxycycline (VIBRA-TABS) 100 MG tablet       Follow up plan: Return if symptoms worsen or fail to improve.     I discussed the assessment and treatment plan with the patient. The patient was provided an opportunity to ask questions and all were answered. The patient agreed with the plan and demonstrated an understanding of the instructions.   The patient was advised to call back or seek an in-person evaluation if the symptoms worsen or if the condition fails to improve as anticipated.  The above assessment and management plan was discussed with the patient. The patient verbalized understanding of and has agreed to the management plan. Patient  is aware to call the clinic if symptoms persist or worsen. Patient is aware when to return to the clinic for a follow-up visit. Patient educated on when it is appropriate to go to the emergency department.    I provided 8 minutes of non-face-to-face time during this encounter.    Worthy Rancher, MD

## 2019-10-11 DIAGNOSIS — Z4789 Encounter for other orthopedic aftercare: Secondary | ICD-10-CM | POA: Diagnosis not present

## 2019-10-12 ENCOUNTER — Ambulatory Visit: Payer: Medicare Other | Admitting: Family Medicine

## 2019-10-12 ENCOUNTER — Other Ambulatory Visit: Payer: Self-pay | Admitting: Cardiovascular Disease

## 2019-10-12 ENCOUNTER — Telehealth: Payer: Self-pay | Admitting: *Deleted

## 2019-10-12 DIAGNOSIS — B372 Candidiasis of skin and nail: Secondary | ICD-10-CM

## 2019-10-12 NOTE — Telephone Encounter (Signed)
Fax from [facility] Refill request Nystatin powder 60 gm Not on current med list Please advise

## 2019-10-17 ENCOUNTER — Other Ambulatory Visit (HOSPITAL_COMMUNITY): Payer: Self-pay | Admitting: Family Medicine

## 2019-10-17 DIAGNOSIS — Z1231 Encounter for screening mammogram for malignant neoplasm of breast: Secondary | ICD-10-CM

## 2019-10-17 MED ORDER — NYSTATIN 100000 UNIT/GM EX POWD: Freq: Three times a day (TID) | CUTANEOUS | 1 refills | Status: DC

## 2019-10-17 NOTE — Telephone Encounter (Signed)
Go ahead with the refill using the same seat that was on the prescription request, thanks.

## 2019-10-17 NOTE — Addendum Note (Signed)
Addended by: Milas Hock on: 10/17/2019 01:21 PM   Modules accepted: Orders

## 2019-10-20 DIAGNOSIS — H5010 Unspecified exotropia: Secondary | ICD-10-CM | POA: Diagnosis not present

## 2019-10-22 ENCOUNTER — Other Ambulatory Visit: Payer: Self-pay | Admitting: Cardiovascular Disease

## 2019-10-24 ENCOUNTER — Ambulatory Visit: Payer: Medicare Other

## 2019-10-27 ENCOUNTER — Ambulatory Visit (INDEPENDENT_AMBULATORY_CARE_PROVIDER_SITE_OTHER): Payer: Medicare Other | Admitting: *Deleted

## 2019-10-27 ENCOUNTER — Other Ambulatory Visit: Payer: Self-pay

## 2019-10-27 DIAGNOSIS — M810 Age-related osteoporosis without current pathological fracture: Secondary | ICD-10-CM | POA: Diagnosis not present

## 2019-10-27 MED ORDER — DENOSUMAB 60 MG/ML ~~LOC~~ SOSY
60.0000 mg | PREFILLED_SYRINGE | Freq: Once | SUBCUTANEOUS | Status: AC
  Administered 2019-10-27: 60 mg via SUBCUTANEOUS

## 2019-10-27 NOTE — Progress Notes (Signed)
Tolerated Prolia injection well 

## 2019-10-28 ENCOUNTER — Encounter: Payer: Self-pay | Admitting: Physical Medicine and Rehabilitation

## 2019-10-28 ENCOUNTER — Ambulatory Visit: Payer: Medicare Other

## 2019-10-28 ENCOUNTER — Encounter
Payer: Medicare Other | Attending: Physical Medicine and Rehabilitation | Admitting: Physical Medicine and Rehabilitation

## 2019-10-28 ENCOUNTER — Other Ambulatory Visit: Payer: Self-pay

## 2019-10-28 VITALS — BP 128/74 | HR 73 | Temp 97.3°F | Ht 62.0 in | Wt 191.0 lb

## 2019-10-28 DIAGNOSIS — M792 Neuralgia and neuritis, unspecified: Secondary | ICD-10-CM | POA: Diagnosis not present

## 2019-10-28 DIAGNOSIS — M797 Fibromyalgia: Secondary | ICD-10-CM | POA: Diagnosis not present

## 2019-10-28 DIAGNOSIS — Z5181 Encounter for therapeutic drug level monitoring: Secondary | ICD-10-CM

## 2019-10-28 DIAGNOSIS — Z79891 Long term (current) use of opiate analgesic: Secondary | ICD-10-CM | POA: Diagnosis not present

## 2019-10-28 DIAGNOSIS — M7918 Myalgia, other site: Secondary | ICD-10-CM | POA: Diagnosis not present

## 2019-10-28 DIAGNOSIS — G894 Chronic pain syndrome: Secondary | ICD-10-CM | POA: Diagnosis not present

## 2019-10-28 MED ORDER — OXYCODONE-ACETAMINOPHEN 5-325 MG PO TABS: 1.0000 | ORAL_TABLET | Freq: Four times a day (QID) | ORAL | 0 refills | Status: DC | PRN

## 2019-10-28 NOTE — Progress Notes (Signed)
Patient is a 74 yr old with Afib, CHF, CAD, pulmonary HTN- and DM- with CKD- Stage III here for right low back pain with sciatica  As well as upper back/neck pain/myofascial painhere for f/u.     Wants injections in same places per pt- worked pretty well for her.   Only lasted 3-4 days- but was very helpful for that time.    Having a lot of pain- due to Percocet- Using skelaxin ~ every 3 days.       Plan: 1. Can use Skelaxin 4 days/week- 3x/day but take every other day off from Skelaxin so don't lose it's ability to work. Don't need refill yet on Skelaxin.   2. Feeling SOB, feeling like it's the mask- O2 sats were 95%- has pulmonary HTN of note.    3. Refill Percocet 4xday as needed-#120- for chronic pain.   4. Patient here for trigger point injections for myofascial pain  Consent done and on chart.  Cleaned areas with alcohol and injected using a 27 gauge 1.5 inch needle  Injected  For myofascial pain- 6cc Using 1% Lidocaine with no EPI  Upper traps B/L Levators B/L Posterior scalenes B/L and posterior scalenes higher up Middle scalenes Splenius Capitus B/L Pectoralis Major Rhomboids Infraspinatus Teres Major/minor Thoracic paraspinals Lumbar paraspinals B/L x2- esp in major trigger point in R lumbar paraspinal.  Other injections-    Patient's level of pain prior was 8/10 Current level of pain after injections is- 6/10-   There was no bleeding or complications.  Patient was advised to drink a lot of water on day after injections to flush system Will have increased soreness for 12-48 hours after injections.  Can use Lidocaine patches the day AFTER injections Can use theracane on day of injections in places didn't inject Can use heating pad/ice  4-6 hours AFTER injections   5/ F/U in 4 weeks

## 2019-10-28 NOTE — Patient Instructions (Signed)
Plan: 1. Can use Skelaxin 4 days/week- 3x/day but take every other day off from Skelaxin so don't lose it's ability to work. Don't need refill yet on Skelaxin.   2. Feeling SOB, feeling like it's the mask- O2 sats were 95%- has pulmonary HTN of note.    3. Refill Percocet 4xday as needed-#120- for chronic pain.   4. Patient here for trigger point injections for myofascial pain  Consent done and on chart.  Cleaned areas with alcohol and injected using a 27 gauge 1.5 inch needle  Injected  For myofascial pain- 6cc Using 1% Lidocaine with no EPI  Upper traps B/L Levators B/L Posterior scalenes B/L and posterior scalenes higher up Middle scalenes Splenius Capitus B/L Pectoralis Major Rhomboids Infraspinatus Teres Major/minor Thoracic paraspinals Lumbar paraspinals B/L x2- esp in major trigger point in R lumbar paraspinal.  Other injections-    Patient's level of pain prior was 8/10 Current level of pain after injections is- 6/10-   There was no bleeding or complications.  Patient was advised to drink a lot of water on day after injections to flush system Will have increased soreness for 12-48 hours after injections.  Can use Lidocaine patches the day AFTER injections Can use theracane on day of injections in places didn't inject Can use heating pad/ice  4-6 hours AFTER injections   5/ F/U in 4 weeks

## 2019-10-30 ENCOUNTER — Other Ambulatory Visit (HOSPITAL_COMMUNITY): Payer: Self-pay | Admitting: Psychiatry

## 2019-11-01 LAB — TOXASSURE SELECT,+ANTIDEPR,UR

## 2019-11-02 ENCOUNTER — Telehealth: Payer: Self-pay | Admitting: *Deleted

## 2019-11-02 NOTE — Telephone Encounter (Signed)
Urine drug screen for this encounter is consistent for prescribed medication 

## 2019-11-08 ENCOUNTER — Telehealth: Payer: Self-pay | Admitting: Neurology

## 2019-11-08 ENCOUNTER — Other Ambulatory Visit: Payer: Self-pay | Admitting: Neurology

## 2019-11-08 DIAGNOSIS — G43109 Migraine with aura, not intractable, without status migrainosus: Secondary | ICD-10-CM

## 2019-11-08 MED ORDER — RIZATRIPTAN BENZOATE 10 MG PO TABS: ORAL_TABLET | ORAL | 11 refills | Status: DC

## 2019-11-08 MED ORDER — TOPIRAMATE 25 MG PO TABS: ORAL_TABLET | ORAL | 11 refills | Status: DC

## 2019-11-08 NOTE — Telephone Encounter (Signed)
There is a lot of room to increase th Topiramate, some patients need higher doses, as long as no side effects. If no side effects, increase Topiramate to 2 in AM, 3 in PM. There are other options for as needed rescue medications aside from Imitrex, has she been on Maxalt in the past?

## 2019-11-08 NOTE — Telephone Encounter (Signed)
Patient called in wanting to speak with nurse. She is having more headaches and would like to see about medication for them.

## 2019-11-08 NOTE — Telephone Encounter (Signed)
Rx sent to local pharmacy on file

## 2019-11-08 NOTE — Telephone Encounter (Signed)
Amber Stephenson called:   Patient is aware that you will going on vacation. She wanted to make sure that her Oxycodone 5/325 will be available (on time).  Please advise.

## 2019-11-08 NOTE — Telephone Encounter (Signed)
Pt stated that she is taken her topirmate 1 in the am and 3 in the pm and its not helping she is out of imitrex, asking about other medications to help with headaches.

## 2019-11-08 NOTE — Addendum Note (Signed)
Addended by: Cameron Sprang on: 11/08/2019 03:01 PM   Modules accepted: Orders

## 2019-11-15 MED ORDER — RIZATRIPTAN BENZOATE 10 MG PO TABS: ORAL_TABLET | ORAL | 5 refills | Status: DC

## 2019-11-21 ENCOUNTER — Other Ambulatory Visit: Payer: Self-pay

## 2019-11-21 ENCOUNTER — Ambulatory Visit (HOSPITAL_COMMUNITY)
Admission: RE | Admit: 2019-11-21 | Discharge: 2019-11-21 | Disposition: A | Discharge status: Home / Self Care | Payer: Medicare Other | Source: Ambulatory Visit | Attending: Family Medicine | Admitting: Family Medicine

## 2019-11-21 DIAGNOSIS — Z1231 Encounter for screening mammogram for malignant neoplasm of breast: Secondary | ICD-10-CM | POA: Insufficient documentation

## 2019-11-23 ENCOUNTER — Other Ambulatory Visit: Payer: Self-pay

## 2019-11-23 MED ORDER — OXYCODONE-ACETAMINOPHEN 5-325 MG PO TABS: 1.0000 | ORAL_TABLET | Freq: Four times a day (QID) | ORAL | 0 refills | Status: DC | PRN

## 2019-11-23 NOTE — Telephone Encounter (Signed)
Deslyn Cavenaugh wanted to make sure she will not run out of Oxycodone 5/325. Per Helene Kelp refills will be due next week.

## 2019-11-24 ENCOUNTER — Other Ambulatory Visit: Payer: Self-pay | Admitting: Family Medicine

## 2019-11-25 ENCOUNTER — Telehealth: Payer: Self-pay | Admitting: *Deleted

## 2019-11-25 NOTE — Telephone Encounter (Signed)
Called pt- was concerned wouldn't get her meds since I was going to be on vacation- however assured her there are people on call to cover me- thank you , ML

## 2019-11-25 NOTE — Telephone Encounter (Signed)
Amber Stephenson called and said that she really needs to speak with Dr Dagoberto Ligas. She did not say what it was in reference to, but is requesting a call back.  Her number is [phone].

## 2019-11-29 ENCOUNTER — Encounter: Payer: Self-pay | Admitting: Family Medicine

## 2019-11-29 ENCOUNTER — Ambulatory Visit (INDEPENDENT_AMBULATORY_CARE_PROVIDER_SITE_OTHER): Payer: Medicare Other | Admitting: Family Medicine

## 2019-11-29 ENCOUNTER — Other Ambulatory Visit: Payer: Self-pay

## 2019-11-29 ENCOUNTER — Ambulatory Visit (INDEPENDENT_AMBULATORY_CARE_PROVIDER_SITE_OTHER): Payer: Medicare Other

## 2019-11-29 VITALS — BP 105/73 | HR 60 | Temp 97.0°F | Ht 62.0 in | Wt 192.0 lb

## 2019-11-29 DIAGNOSIS — R1012 Left upper quadrant pain: Secondary | ICD-10-CM

## 2019-11-29 DIAGNOSIS — L282 Other prurigo: Secondary | ICD-10-CM

## 2019-11-29 LAB — MICROSCOPIC EXAMINATION
RBC, Urine: NONE SEEN /hpf (ref 0–2)
Renal Epithel, UA: NONE SEEN /hpf
WBC, UA: NONE SEEN /hpf (ref 0–5)

## 2019-11-29 LAB — URINALYSIS, COMPLETE
Bilirubin, UA: NEGATIVE
Leukocytes,UA: NEGATIVE
Nitrite, UA: POSITIVE — AB
RBC, UA: NEGATIVE
Specific Gravity, UA: 1.025 (ref 1.005–1.030)
Urobilinogen, Ur: 1 mg/dL (ref 0.2–1.0)
pH, UA: 5.5 (ref 5.0–7.5)

## 2019-11-29 MED ORDER — METRONIDAZOLE 500 MG PO TABS: 500.0000 mg | ORAL_TABLET | Freq: Three times a day (TID) | ORAL | 0 refills | Status: DC

## 2019-11-29 MED ORDER — TRIAMCINOLONE ACETONIDE 0.5 % EX OINT: 1.0000 "application " | TOPICAL_OINTMENT | Freq: Two times a day (BID) | CUTANEOUS | 2 refills | Status: DC | PRN

## 2019-11-29 MED ORDER — CIPROFLOXACIN HCL 500 MG PO TABS
500.0000 mg | ORAL_TABLET | Freq: Two times a day (BID) | ORAL | 0 refills | Status: DC
Start: 2019-11-29 — End: 2019-12-06

## 2019-11-29 NOTE — Progress Notes (Signed)
Subjective:  Patient ID: Amber Stephenson, female    DOB: [birth year]  Age: 74 y.o. MRN: [id number]  CC: Wound Infection and Abdominal Pain   HPI Amber Stephenson presents for patient has had 3 days of increasing pain in the abdomen.  There is been a vague sensation there for 3 months umbilical area.  However this is increased dramatically moved to the left upper quadrant over the last 3 days.  It has affected her appetite.  It is much diminished.  However she denies nausea vomiting and diarrhea.  She states she had a normal bowel movement just yesterday.  She denies any thing in her diet that might have been suspicious.  She has had no melena or hematochezia.  No hematemesis.  The pain is moderately severe and described as an ache with some sharp component.  Depression screen [facility] 2/9 11/29/2019 08/29/2019 08/22/2019  Decreased Interest 0 0 0  Down, Depressed, Hopeless 0 0 0  PHQ - 2 Score 0 0 0  Altered sleeping - - 0  Tired, decreased energy - - 3  Change in appetite - - 0  Feeling bad or failure about yourself  - - 0  Trouble concentrating - - 0  Moving slowly or fidgety/restless - - 0  Suicidal thoughts - - 0  PHQ-9 Score - - 3  Difficult doing work/chores - - Somewhat difficult  Some recent data might be hidden    History Amber Stephenson has a past medical history of Atrial fibrillation with RVR (05/19/2017), Bipolar I disorder (01/23/2015), CAD (coronary artery disease), Cardiomegaly (01/12/2018), Chronic anticoagulation (05/30/2015), Chronic back pain, Chronic diastolic CHF (congestive heart failure) (05/30/2015), Diabetes mellitus, type 2, without complication, Diabetic neuropathy (02/06/2016), Dysrhythmia, Essential hypertension, Herpes genitalis in women (07/16/2015), Hyperlipidemia, Hypertension, Hypoxia (02/01/2019), Insomnia (01/23/2015), Lipoma (02/08/2015), Major depressive disorder, Migraine headache with aura (02/12/2016), 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   She has a past surgical history that includes THIGH SURGERY; Shoulder surgery (Right); Breast reduction surgery; Eye surgery (Right); Hammer toe surgery; LEFT HEART CATH AND CORONARY ANGIOGRAPHY (N/A, 02/03/2018); and Reverse shoulder arthroplasty (Right, 08/19/2019).   Her family history includes Alcohol abuse in her sister; Diabetes in her brother and mother; Heart disease in her brother, mother, and sister; Mental illness in her brother.She reports that she has never smoked. She has never used smokeless tobacco. She reports current alcohol use. She reports that she does not use drugs.    ROS Review of Systems  Constitutional: Negative.   HENT: Negative.   Eyes: Negative for visual disturbance.  Respiratory: Negative for shortness of breath.   Cardiovascular: Negative for chest pain.  Gastrointestinal: Positive for abdominal pain.  Musculoskeletal: Positive for back pain (This is chronic.  She is working on it with a back specialist.  It does not relate temporally to abdominal pain.).   The ears have no sign of acute injury.  There is an area the patient points out as being the area of injury that has a notable abrasion and contusion it is at the tragus.  There is some loss of cartilage noted some hyperpigmentation that is actually mild.  No other abnormality noted.  This is nontender to palpation.  Objective:  BP 105/73   Pulse 60   Temp (!) 97 F (36.1 C) (Temporal)   Ht 5' 2"  (1.575 m)   Wt 192 lb (87.1  kg)   BMI 35.12 kg/m   BP Readings from Last 3 Encounters:  11/29/19 105/73  10/28/19 128/74  09/26/19 115/81    Wt Readings from Last 3 Encounters:  11/29/19 192 lb (87.1 kg)  10/28/19 191 lb (86.6 kg)  09/26/19 189 lb (85.7 kg)     Physical Exam Constitutional:      Appearance: She is  well-developed.  HENT:     Head: Normocephalic and atraumatic.     Right Ear: External ear normal.     Left Ear: External ear normal.  Cardiovascular:     Rate and Rhythm: Normal rate and regular rhythm.     Heart sounds: No murmur heard.   Pulmonary:     Effort: Pulmonary effort is normal.     Breath sounds: Normal breath sounds.  Abdominal:     General: Bowel sounds are normal.     Palpations: Abdomen is soft. There is no mass.     Tenderness: There is abdominal tenderness in the left upper quadrant. There is no guarding or rebound. Negative signs include Murphy's sign and McBurney's sign.  Skin:    General: Skin is warm and dry.     Findings: No rash.  Neurological:     Mental Status: She is alert and oriented to person, place, and time.  Psychiatric:        Behavior: Behavior normal.    There is no acute abdomen noted on x-ray.  There is increased gas in the transverse colon with some retained stool.  Urinalysis shows definite evidence for infection.  Large numbers of bacteria, positive nitrite.   Assessment & Plan:   Amber Stephenson was seen today for wound infection and abdominal pain.  Diagnoses and all orders for this visit:  Left upper quadrant abdominal pain -     CBC with Differential/Platelet -     CMP14+EGFR -     DG Abd 2 Views; Future -     Lipase -     Urinalysis, Complete -     Amylase  Pruritic rash -     triamcinolone ointment (KENALOG) 0.5 %; Apply 1 application topically 2 (two) times daily as needed (skin irritation/rash.).  Other orders -     metroNIDAZOLE (FLAGYL) 500 MG tablet; Take 1 tablet (500 mg total) by mouth 3 (three) times daily. -     ciprofloxacin (CIPRO) 500 MG tablet; Take 1 tablet (500 mg total) by mouth 2 (two) times daily.       I have discontinued Adylene Dlugosz. Iseminger's doxycycline. I have also changed her triamcinolone ointment. Additionally, I am having her start on metroNIDAZOLE and ciprofloxacin. Lastly, I am having her  maintain her Vitamin D3, naloxone, insulin glargine, Eszopiclone, acetaminophen, metoprolol tartrate, Vascepa, atorvastatin, Semaglutide, potassium chloride SA, Eliquis, albuterol, ProAir HFA, metaxalone, budesonide-formoterol, diltiazem, ibuprofen, ARIPiprazole, carbamazepine, LORazepam, pregabalin, furosemide, nystatin, metoprolol tartrate, traZODone, rizatriptan, topiramate, oxyCODONE-acetaminophen, and SUMAtriptan.  Allergies as of 11/29/2019      Reactions   Ivp Dye [iodinated Diagnostic Agents] Anaphylaxis, Swelling   Throat closes   Latex    Latex tape pulls skin with it      Medication List       Accurate as of November 29, 2019 12:28 PM. If you have any questions, ask your nurse or doctor.        STOP taking these medications   doxycycline 100 MG tablet Commonly known as: VIBRA-TABS Stopped by: [name], MD     TAKE these medications   acetaminophen  325 MG tablet Commonly known as: TYLENOL Take 325-650 mg by mouth every 6 (six) hours as needed for mild pain, moderate pain or headache.   albuterol (2.5 MG/3ML) 0.083% nebulizer solution Commonly known as: PROVENTIL Take 3 mLs (2.5 mg total) by nebulization every 6 (six) hours as needed for wheezing or shortness of breath.   ProAir HFA 108 (90 Base) MCG/ACT inhaler Generic drug: albuterol Inhale 1-2 puffs into the lungs every 6 (six) hours as needed. As needed   ARIPiprazole 10 MG tablet Commonly known as: ABILIFY Take 1 tablet (10 mg total) by mouth once for 1 dose.   atorvastatin 40 MG tablet Commonly known as: LIPITOR Take 1 tablet (40 mg total) by mouth daily.   budesonide-formoterol 80-4.5 MCG/ACT inhaler Commonly known as: Symbicort Inhale 2 puffs into the lungs 2 (two) times daily.   carbamazepine 100 MG 12 hr capsule Commonly known as: CARBATROL Take 1 capsule (100 mg total) by mouth daily.   ciprofloxacin 500 MG tablet Commonly known as: Cipro Take 1 tablet (500 mg total) by mouth 2 (two) times  daily. Started by: [name], MD   diltiazem 120 MG 24 hr capsule Commonly known as: CARDIZEM CD TAKE 1 CAPSULE AT BEDTIME   Eliquis 5 MG Tabs tablet Generic drug: apixaban TAKE 1 TABLET TWICE A DAY What changed: how much to take   Eszopiclone 3 MG Tabs Commonly known as: eszopiclone Take 1 tablet (3 mg total) by mouth at bedtime as needed. Take immediately before bedtime What changed: reasons to take this   furosemide 40 MG tablet Commonly known as: LASIX TAKE 1 TABLET EVERY MORNING  (DOSE INCREASED)   ibuprofen 800 MG tablet Commonly known as: ADVIL Take 1 tablet (800 mg total) by mouth every 8 (eight) hours as needed.   insulin glargine 100 UNIT/ML Solostar Pen Commonly known as: Lantus SoloStar INJECT 60 UNITS UNDER THE SKIN TWICE A DAY What changed:   how much to take  how to take this  when to take this  additional instructions   LORazepam 1 MG tablet Commonly known as: Ativan Take 1-2 tablets (1-2 mg total) by mouth See admin instructions. Take 1 tab (1 mg) in the morning and 2 tabs (2 mg ) at bedtime   metaxalone 800 MG tablet Commonly known as: Skelaxin Take 1 tablet (800 mg total) by mouth 3 (three) times daily as needed for muscle spasms.   metoprolol tartrate 100 MG tablet Commonly known as: LOPRESSOR Take 1 tablet (100 mg total) by mouth 2 (two) times daily. Take 1106m + 249mfor a total of 12544mwice a day.   metoprolol tartrate 25 MG tablet Commonly known as: LOPRESSOR TAKE 1 TABLET TWICE A DAY (TO BE TAKEN ALONG WITH THE LOPRESSOR 100 MG TWICE A DAY)   metroNIDAZOLE 500 MG tablet Commonly known as: FLAGYL Take 1 tablet (500 mg total) by mouth 3 (three) times daily. Started by: WarClaretta FraiseD   Narcan 4 MG/0.1ML Liqd nasal spray kit Generic drug: naloxone Place 1 spray into the nose once as needed (opiod overdose).   nystatin powder Commonly known as: MYCOSTATIN/NYSTOP Apply topically 3 (three) times daily. x2 weeks (for  groin/breast rash)   oxyCODONE-acetaminophen 5-325 MG tablet Commonly known as: Percocet Take 1 tablet by mouth every 6 (six) hours as needed for severe pain.   potassium chloride SA 20 MEQ tablet Commonly known as: KLOR-CON Take 1 tablet (20 mEq total) by mouth daily. What changed: when to take this  pregabalin 200 MG capsule Commonly known as: Lyrica Take 1 capsule (200 mg total) by mouth 2 (two) times daily.   rizatriptan 10 MG tablet Commonly known as: Maxalt Take 1 tablet at onset of migraine. Do not take more than 2-3 a week What changed: Another medication with the same name was removed. Continue taking this medication, and follow the directions you see here. Changed by: [name], MD   Rybelsus 7 MG Tabs Generic drug: Semaglutide Take 7 mg by mouth daily.   SUMAtriptan 100 MG tablet Commonly known as: IMITREX TAKE 1 TABLET BY MOUTH IF NEEDED FOR MIGRAINE(MAY REPEAT IN 2 HOURS IF HEADACHE PERSISTS OR RECURS)   topiramate 25 MG tablet Commonly known as: TOPAMAX Take 2 tablets in AM, 3 tablets in PM   traZODone 100 MG tablet Commonly known as: DESYREL TAKE 4 TABLETS AT BEDTIME   triamcinolone ointment 0.5 % Commonly known as: KENALOG Apply 1 application topically 2 (two) times daily as needed (skin irritation/rash.).   Vascepa 1 g capsule Generic drug: icosapent Ethyl TAKE 2 CAPSULES TWICE A DAY WITH MEALS What changed: See the new instructions.   Vitamin D3 25 MCG (1000 UT) Caps Take 1,000 Units by mouth daily.        Follow-up: Return in about 2 days (around 12/01/2019), or if symptoms worsen or fail to improve.  [name], M.D.

## 2019-11-30 ENCOUNTER — Telehealth: Payer: Self-pay | Admitting: Family Medicine

## 2019-11-30 ENCOUNTER — Ambulatory Visit (INDEPENDENT_AMBULATORY_CARE_PROVIDER_SITE_OTHER): Payer: Medicare Other | Admitting: *Deleted

## 2019-11-30 DIAGNOSIS — Z0000 Encounter for general adult medical examination without abnormal findings: Secondary | ICD-10-CM | POA: Diagnosis not present

## 2019-11-30 LAB — CBC WITH DIFFERENTIAL/PLATELET
Basophils Absolute: 0.1 10*3/uL (ref 0.0–0.2)
Basos: 1 %
EOS (ABSOLUTE): 0.4 10*3/uL (ref 0.0–0.4)
Eos: 5 %
Hematocrit: 41 % (ref 34.0–46.6)
Hemoglobin: 13.8 g/dL (ref 11.1–15.9)
Immature Grans (Abs): 0 10*3/uL (ref 0.0–0.1)
Immature Granulocytes: 0 %
Lymphocytes Absolute: 2.2 10*3/uL (ref 0.7–3.1)
Lymphs: 29 %
MCH: 29.7 pg (ref 26.6–33.0)
MCHC: 33.7 g/dL (ref 31.5–35.7)
MCV: 88 fL (ref 79–97)
Monocytes Absolute: 0.5 10*3/uL (ref 0.1–0.9)
Monocytes: 7 %
Neutrophils Absolute: 4.3 10*3/uL (ref 1.4–7.0)
Neutrophils: 58 %
Platelets: 196 10*3/uL (ref 150–450)
RBC: 4.65 x10E6/uL (ref 3.77–5.28)
RDW: 13.1 % (ref 11.7–15.4)
WBC: 7.5 10*3/uL (ref 3.4–10.8)

## 2019-11-30 LAB — CMP14+EGFR
ALT: 12 IU/L (ref 0–32)
AST: 17 IU/L (ref 0–40)
Albumin/Globulin Ratio: 1.5 (ref 1.2–2.2)
Albumin: 4.1 g/dL (ref 3.7–4.7)
Alkaline Phosphatase: 117 IU/L (ref 48–121)
BUN/Creatinine Ratio: 15 (ref 12–28)
BUN: 13 mg/dL (ref 8–27)
Bilirubin Total: 0.3 mg/dL (ref 0.0–1.2)
CO2: 21 mmol/L (ref 20–29)
Calcium: 9.2 mg/dL (ref 8.7–10.3)
Chloride: 106 mmol/L (ref 96–106)
Creatinine, Ser: 0.84 mg/dL (ref 0.57–1.00)
GFR calc Af Amer: 80 mL/min/{1.73_m2} (ref >59)
GFR calc non Af Amer: 70 mL/min/{1.73_m2} (ref >59)
Globulin, Total: 2.7 g/dL (ref 1.5–4.5)
Glucose: 181 mg/dL — ABNORMAL HIGH (ref 65–99)
Potassium: 4.1 mmol/L (ref 3.5–5.2)
Sodium: 140 mmol/L (ref 134–144)
Total Protein: 6.8 g/dL (ref 6.0–8.5)

## 2019-11-30 LAB — AMYLASE: Amylase: 31 U/L (ref 31–110)

## 2019-11-30 LAB — LIPASE: Lipase: 26 U/L (ref 14–85)

## 2019-11-30 NOTE — Telephone Encounter (Signed)
Gas in her colon. Normal and harmless.

## 2019-11-30 NOTE — Telephone Encounter (Signed)
Pt aware.

## 2019-11-30 NOTE — Telephone Encounter (Signed)
Patient would like to know what the "bubble in her intestines" is that was seen on x-ray.

## 2019-11-30 NOTE — Progress Notes (Signed)
MEDICARE ANNUAL WELLNESS VISIT  11/30/2019  Telephone Visit Disclaimer This Medicare AWV was conducted by telephone due to national recommendations for restrictions regarding the COVID-19 Pandemic (e.g. social distancing).  I verified, using two identifiers, that I am speaking with Amber Stephenson or their authorized healthcare agent. I discussed the limitations, risks, security, and privacy concerns of performing an evaluation and management service by telephone and the potential availability of an in-person appointment in the future. The patient expressed understanding and agreed to proceed.   Subjective:  Amber Stephenson is a 74 y.o. female patient of Stacks, Cletus Gash, MD who had a Medicare Annual Wellness Visit today via telephone. Amber Stephenson is retired and lives with her husband Amber Stephenson. She has 3 daughters. She reports that she is socially active and does interact with friends/family regularly. She is not physically active and enjoys painting.   Patient Care Team: [name], MD as PCP - General (Family Medicine) Herminio Commons, MD as PCP - Cardiology (Cardiology) Weber Cooks, MD as Referring Physician (Orthopedic Surgery) Norma Fredrickson, MD as Consulting Physician (Psychiatry) Susette Racer, MD (Endocrinology) Cameron Sprang, MD as Consulting Physician (Neurology)  Advanced Directives 11/30/2019 08/26/2019 08/23/2019 08/22/2019 08/19/2019 08/12/2019 05/11/2019  Does Patient Have a Medical Advance Directive? No No - No No No No  Would patient like information on creating a medical advance directive? No - Patient declined - No - Guardian declined - No - Patient declined - -  Some encounter information is confidential and restricted. Go to Review Flowsheets activity to see all data.    Hospital Utilization Over the Past 12 Months: # of hospitalizations or ER visits: 1 # of surgeries: 1  Review of Systems    Patient reports that her overall health is worse compared to  last year.  History obtained from the patient and patient chart.   Patient Reported Readings (BP, Pulse, CBG, Weight, etc) none  Pain Assessment Pain : 0-10 Pain Score: 7  Pain Type: Acute pain Pain Location: Abdomen Pain Orientation: Left Pain Descriptors / Indicators: Sharp Pain Onset: In the past 7 days Pain Relieving Factors: none Effect of Pain on Daily Activities: " makes me not want to do anything"  Pain Relieving Factors: none  Current Medications & Allergies (verified) Allergies as of 11/30/2019      Reactions   Ivp Dye [iodinated Diagnostic Agents] Anaphylaxis, Swelling   Throat closes   Latex    Latex tape pulls skin with it      Medication List       Accurate as of November 30, 2019  2:27 PM. If you have any questions, ask your nurse or doctor.        acetaminophen 325 MG tablet Commonly known as: TYLENOL Take 325-650 mg by mouth every 6 (six) hours as needed for mild pain, moderate pain or headache.   albuterol (2.5 MG/3ML) 0.083% nebulizer solution Commonly known as: PROVENTIL Take 3 mLs (2.5 mg total) by nebulization every 6 (six) hours as needed for wheezing or shortness of breath.   ProAir HFA 108 (90 Base) MCG/ACT inhaler Generic drug: albuterol Inhale 1-2 puffs into the lungs every 6 (six) hours as needed. As needed   ARIPiprazole 10 MG tablet Commonly known as: ABILIFY Take 1 tablet (10 mg total) by mouth once for 1 dose.   atorvastatin 40 MG tablet Commonly known as: LIPITOR Take 1 tablet (40 mg total) by mouth daily.   budesonide-formoterol 80-4.5 MCG/ACT inhaler Commonly known as:  Symbicort Inhale 2 puffs into the lungs 2 (two) times daily.   carbamazepine 100 MG 12 hr capsule Commonly known as: CARBATROL Take 1 capsule (100 mg total) by mouth daily.   ciprofloxacin 500 MG tablet Commonly known as: Cipro Take 1 tablet (500 mg total) by mouth 2 (two) times daily.   diltiazem 120 MG 24 hr capsule Commonly known as: CARDIZEM CD TAKE  1 CAPSULE AT BEDTIME   Eliquis 5 MG Tabs tablet Generic drug: apixaban TAKE 1 TABLET TWICE A DAY What changed: how much to take   Eszopiclone 3 MG Tabs Commonly known as: eszopiclone Take 1 tablet (3 mg total) by mouth at bedtime as needed. Take immediately before bedtime What changed: reasons to take this   furosemide 40 MG tablet Commonly known as: LASIX TAKE 1 TABLET EVERY MORNING  (DOSE INCREASED)   ibuprofen 800 MG tablet Commonly known as: ADVIL Take 1 tablet (800 mg total) by mouth every 8 (eight) hours as needed.   insulin glargine 100 UNIT/ML Solostar Pen Commonly known as: Lantus SoloStar INJECT 60 UNITS UNDER THE SKIN TWICE A DAY What changed:   how much to take  how to take this  when to take this  additional instructions   LORazepam 1 MG tablet Commonly known as: Ativan Take 1-2 tablets (1-2 mg total) by mouth See admin instructions. Take 1 tab (1 mg) in the morning and 2 tabs (2 mg ) at bedtime   metaxalone 800 MG tablet Commonly known as: Skelaxin Take 1 tablet (800 mg total) by mouth 3 (three) times daily as needed for muscle spasms.   metoprolol tartrate 100 MG tablet Commonly known as: LOPRESSOR Take 1 tablet (100 mg total) by mouth 2 (two) times daily. Take 156m + 275mfor a total of 125106mwice a day.   metoprolol tartrate 25 MG tablet Commonly known as: LOPRESSOR TAKE 1 TABLET TWICE A DAY (TO BE TAKEN ALONG WITH THE LOPRESSOR 100 MG TWICE A DAY)   metroNIDAZOLE 500 MG tablet Commonly known as: FLAGYL Take 1 tablet (500 mg total) by mouth 3 (three) times daily.   Narcan 4 MG/0.1ML Liqd nasal spray kit Generic drug: naloxone Place 1 spray into the nose once as needed (opiod overdose).   nystatin powder Commonly known as: MYCOSTATIN/NYSTOP Apply topically 3 (three) times daily. x2 weeks (for groin/breast rash)   oxyCODONE-acetaminophen 5-325 MG tablet Commonly known as: Percocet Take 1 tablet by mouth every 6 (six) hours as needed for  severe pain.   potassium chloride SA 20 MEQ tablet Commonly known as: KLOR-CON Take 1 tablet (20 mEq total) by mouth daily. What changed: when to take this   pregabalin 200 MG capsule Commonly known as: Lyrica Take 1 capsule (200 mg total) by mouth 2 (two) times daily.   rizatriptan 10 MG tablet Commonly known as: Maxalt Take 1 tablet at onset of migraine. Do not take more than 2-3 a week   Rybelsus 7 MG Tabs Generic drug: Semaglutide Take 7 mg by mouth daily.   SUMAtriptan 100 MG tablet Commonly known as: IMITREX TAKE 1 TABLET BY MOUTH IF NEEDED FOR MIGRAINE(MAY REPEAT IN 2 HOURS IF HEADACHE PERSISTS OR RECURS)   topiramate 25 MG tablet Commonly known as: TOPAMAX Take 2 tablets in AM, 3 tablets in PM   traZODone 100 MG tablet Commonly known as: DESYREL TAKE 4 TABLETS AT BEDTIME   triamcinolone ointment 0.5 % Commonly known as: KENALOG Apply 1 application topically 2 (two) times daily as needed (  skin irritation/rash.).   Vascepa 1 g capsule Generic drug: icosapent Ethyl TAKE 2 CAPSULES TWICE A DAY WITH MEALS What changed: See the new instructions.   Vitamin D3 25 MCG (1000 UT) Caps Take 1,000 Units by mouth daily.       History (reviewed): Past Medical History:  Diagnosis Date  . Atrial fibrillation with RVR 05/19/2017  . Bipolar I disorder 01/23/2015  . CAD (coronary artery disease)    Nonobstructive; Managed by Dr. Bronson Ing  . Cardiomegaly 01/12/2018  . Chronic anticoagulation 05/30/2015  . Chronic back pain    Lower back  . Chronic diastolic CHF (congestive heart failure) 05/30/2015  . Diabetes mellitus, type 2, without complication   . Diabetic neuropathy 02/06/2016  . Dysrhythmia    A-Fib  . Essential hypertension   . Herpes genitalis in women 07/16/2015  . Hyperlipidemia   . Hypertension   . Hypoxia 02/01/2019  . Insomnia 01/23/2015  . Lipoma 02/08/2015  . Major depressive disorder   . Migraine headache with aura 02/12/2016  . 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 NAFL (nonalcoholic fatty liver) 6/57/8469  . OSA (obstructive sleep apnea) 02/24/2019   10/09/2018 - HST  - AHI 40.6   . Pulmonary hypertension   . Renal insufficiency    Managed by Dr. Wallace Keller  . RLS (restless legs syndrome) 04/27/2015   Past Surgical History:  Procedure Laterality Date  . BREAST REDUCTION SURGERY    . EYE SURGERY Right    cateracts  . HAMMER TOE SURGERY    . LEFT HEART CATH AND CORONARY ANGIOGRAPHY N/A 02/03/2018   Procedure: LEFT HEART CATH AND CORONARY ANGIOGRAPHY;  Surgeon: Martinique, Peter M, MD;  Location: [facility];  Service: Cardiovascular;  Laterality: N/A;  . REVERSE SHOULDER ARTHROPLASTY Right 08/19/2019   Procedure: REVERSE SHOULDER ARTHROPLASTY;  Surgeon: Netta Cedars, MD;  Location: [facility];  Service: Orthopedics;  Laterality: Right;  interscalene block  . SHOULDER SURGERY Right    "I BROKE MY SHOUDLER  . THIGH SURGERY     "TO REMOVE A TUMOR "   Family History  Problem Relation Age of Onset  . Diabetes Mother   . Heart disease Mother   . Heart disease Brother   . Heart disease Sister        CABG  . Diabetes Sister   . Alzheimer's disease Father   . Alcohol abuse Sister   . Mental illness Brother   . Diabetes Brother    Social History   Socioeconomic History  . Marital status: Married    Spouse name: Amber Stephenson  . Number of children: 3  . Years of education: 40  . Highest education level: Bachelor's degree (e.g., BA, AB, BS)  Occupational History  . Occupation: Retired    Comment: marketing  Tobacco Use  . Smoking status: Never Smoker  . Smokeless tobacco: Never Used  Vaping Use  . Vaping Use: Never used  Substance and Sexual Activity  . Alcohol use: Not Currently    Alcohol/week: 0.0 standard drinks  . Drug use: No  . Sexual activity: Yes    Partners: Male    Birth control/protection: Post-menopausal  Other Topics Concern  . Not on file  Social History Narrative   Lives at home with  husband.       Right handed   Social Determinants of Health   Financial Resource Strain:   . Difficulty of Paying Living Expenses:   Food Insecurity:   . Worried About Running  Out of Food in the Last Year:   . [place] in the Last Year:   Transportation Needs:   . Lack of Transportation (Medical):   Marland Kitchen Lack of Transportation (Non-Medical):   Physical Activity:   . Days of Exercise per Week:   . Minutes of Exercise per Session:   Stress:   . Feeling of Stress :   Social Connections:   . Frequency of Communication with Friends and Family:   . Frequency of Social Gatherings with Friends and Family:   . Attends Religious Services:   . Active Member of Clubs or Organizations:   . Attends Archivist Meetings:   Marland Kitchen Marital Status:     Activities of Daily Living In your present state of health, do you have any difficulty performing the following activities: 11/30/2019 08/23/2019  Hearing? N N  Vision? N N  Difficulty concentrating or making decisions? Y N  Comment some memory loss and difficulty making decisions -  Walking or climbing stairs? Y N  Comment avoids climbing stairs -  Dressing or bathing? Y N  Comment needs assistance sometimes -  Doing errands, shopping? Y N  Comment i do not drive Facilities manager and eating ? N -  Using the Toilet? N -  In the past six months, have you accidently leaked urine? Y -  Comment wears pads or depends -  Do you have problems with loss of bowel control? N -  Managing your Medications? Y -  Comment husband helps -  Managing your Finances? N -  Housekeeping or managing your Housekeeping? Y -  Comment has someone come once monthly to clean -  Some recent data might be hidden    Patient Education/ Literacy How often do you need to have someone help you when you read instructions, pamphlets, or other written materials from your doctor or pharmacy?: 1 - Never What is the last grade level you completed in school?:  bachelors degree  Exercise Current Exercise Habits: The patient does not participate in regular exercise at present, Exercise limited by: None identified  Diet Patient reports consuming 2 meals a day and 1 snack(s) a day Patient reports that her primary diet is: Regular Patient reports that she does have regular access to food.   Depression Screen PHQ 2/9 Scores 11/30/2019 11/29/2019 08/29/2019 08/22/2019 08/08/2019 06/27/2019 06/06/2019  PHQ - 2 Score 0 0 0 0 0 0 0  PHQ- 9 Score - - - 3 - 0 -  Exception Documentation - - - - - - -     Fall Risk Fall Risk  11/30/2019 11/29/2019 10/28/2019 09/26/2019 08/29/2019  Falls in the past year? 0 0 0 0 0  Number falls in past yr: - 0 - - -  Comment - - - - -  Injury with Fall? - 0 - - -  Comment - - - - -  Risk for fall due to : - No Fall Risks - - -  Follow up - Falls evaluation completed - - -     Objective:  Amber Stephenson seemed alert and oriented and she participated appropriately during our telephone visit.  Blood Pressure Weight BMI  BP Readings from Last 3 Encounters:  11/29/19 105/73  10/28/19 128/74  09/26/19 115/81   Wt Readings from Last 3 Encounters:  11/29/19 192 lb (87.1 kg)  10/28/19 191 lb (86.6 kg)  09/26/19 189 lb (85.7 kg)   BMI Readings from Last 1 Encounters:  11/29/19 35.12 kg/m    *Unable to obtain current vital signs, weight, and BMI due to telephone visit type  Hearing/Vision  . Amber Stephenson did not seem to have difficulty with hearing/understanding during the telephone conversation . Reports that she has had a formal eye exam by an eye care professional within the past year . Reports that she has not had a formal hearing evaluation within the past year *Unable to fully assess hearing and vision during telephone visit type  Cognitive Function: 6CIT Screen 11/30/2019 11/29/2018  What Year? 0 points 0 points  What month? 0 points 0 points  What time? 0 points 0 points  Count back from 20 4 points 4 points  Months  in reverse 4 points 4 points  Repeat phrase 4 points 2 points  Total Score 12 10   (Normal:0-7, Significant for Dysfunction: >8)  Normal Cognitive Function Screening: no   Immunization & Health Maintenance Record Immunization History  Administered Date(s) Administered  . Fluad Quad(high Dose 65+) 03/04/2019  . Influenza, High Dose Seasonal PF 03/19/2017, 02/17/2018  . Influenza,inj,Quad PF,6+ Mos 03/30/2015, 03/17/2016  . Moderna SARS-COVID-2 Vaccination 07/18/2019, 08/15/2019  . Pneumococcal Conjugate-13 11/12/2017  . Pneumococcal Polysaccharide-23 05/30/2015  . Zoster 06/02/2012    Health Maintenance  Topic Date Due  . OPHTHALMOLOGY EXAM  11/10/2019  . TETANUS/TDAP  05/10/2020 (Originally 06/02/1964)  . INFLUENZA VACCINE  01/01/2020  . HEMOGLOBIN A1C  02/29/2020  . FOOT EXAM  05/19/2020  . URINE MICROALBUMIN  08/28/2020  . MAMMOGRAM  11/20/2021  . COLONOSCOPY  04/01/2027  . DEXA SCAN  Completed  . COVID-19 Vaccine  Completed  . Hepatitis C Screening  Completed  . PNA vac Low Risk Adult  Completed       Assessment  This is a routine wellness examination for Amber Stephenson.  Health Maintenance: Due or Overdue Health Maintenance Due  Topic Date Due  . OPHTHALMOLOGY EXAM  11/10/2019    Amber Stephenson does not need a referral for Community Assistance: Care Management:   no Social Work:    no Prescription Assistance:  no Nutrition/Diabetes Education:  no   Plan:  Personalized Goals Goals Addressed            This Visit's Progress   . Patient Stated       11/30/2019 AWV Goal: Keep All Scheduled Appointments  Over the next year, patient will attend all scheduled appointments with their PCP and any specialists that they see.       Personalized Health Maintenance & Screening Recommendations  Eye Exam  Lung Cancer Screening Recommended: no (Low Dose CT Chest recommended if Age 65-80 years, 30 pack-year currently smoking OR have quit w/in past 15  years) Hepatitis C Screening recommended: no HIV Screening recommended: no  Advanced Directives: Written information was not prepared per patient's request.  Referrals & Orders No orders of the defined types were placed in this encounter.   Follow-up Plan . Follow-up with [name], MD as planned . Schedule eye exam .    I have personally reviewed and noted the following in the patient's chart:   . Medical and social history . Use of alcohol, tobacco or illicit drugs  . Current medications and supplements . Functional ability and status . Nutritional status . Physical activity . Advanced directives . List of other physicians . Hospitalizations, surgeries, and ER visits in previous 12 months . Vitals . Screenings to include cognitive, depression, and falls . Referrals and appointments  In addition,  I have reviewed and discussed with Amber Stephenson certain preventive protocols, quality metrics, and best practice recommendations. A written personalized care plan for preventive services as well as general preventive health recommendations is available and can be mailed to the patient at her request.      Baldomero Lamy, LPN  [id number]/40/3709

## 2019-12-01 ENCOUNTER — Other Ambulatory Visit: Payer: Self-pay | Admitting: Family Medicine

## 2019-12-01 ENCOUNTER — Telehealth: Payer: Self-pay | Admitting: Family Medicine

## 2019-12-01 DIAGNOSIS — R1012 Left upper quadrant pain: Secondary | ICD-10-CM

## 2019-12-01 DIAGNOSIS — K567 Ileus, unspecified: Secondary | ICD-10-CM

## 2019-12-01 NOTE — Telephone Encounter (Signed)
-----   Message from [name], MD sent at 12/01/2019 12:06 PM EDT ----- Please let the patient know that based on her evaluation during her phone interview for her annual well visit yesterday, I have ordered a CT scan of her abdomen since she is still having significant pain.Thanks

## 2019-12-01 NOTE — Telephone Encounter (Signed)
Called patient she is aware and verbalized understanding.

## 2019-12-06 ENCOUNTER — Ambulatory Visit (INDEPENDENT_AMBULATORY_CARE_PROVIDER_SITE_OTHER): Payer: Medicare Other | Admitting: Family Medicine

## 2019-12-06 ENCOUNTER — Other Ambulatory Visit: Payer: Self-pay | Admitting: Cardiovascular Disease

## 2019-12-06 ENCOUNTER — Encounter: Payer: Self-pay | Admitting: Family Medicine

## 2019-12-06 ENCOUNTER — Telehealth: Payer: Self-pay | Admitting: Student

## 2019-12-06 DIAGNOSIS — R399 Unspecified symptoms and signs involving the genitourinary system: Secondary | ICD-10-CM

## 2019-12-06 LAB — URINALYSIS, COMPLETE
Bilirubin, UA: NEGATIVE
Glucose, UA: NEGATIVE
Ketones, UA: NEGATIVE
Leukocytes,UA: NEGATIVE
Nitrite, UA: NEGATIVE
Protein,UA: NEGATIVE
RBC, UA: NEGATIVE
Specific Gravity, UA: 1.01 (ref 1.005–1.030)
Urobilinogen, Ur: 0.2 mg/dL (ref 0.2–1.0)
pH, UA: 5.5 (ref 5.0–7.5)

## 2019-12-06 LAB — MICROSCOPIC EXAMINATION
Bacteria, UA: NONE SEEN
RBC, Urine: NONE SEEN /hpf (ref 0–2)
Renal Epithel, UA: NONE SEEN /hpf
WBC, UA: NONE SEEN /hpf (ref 0–5)

## 2019-12-06 MED ORDER — SULFAMETHOXAZOLE-TRIMETHOPRIM 800-160 MG PO TABS: 1.0000 | ORAL_TABLET | Freq: Two times a day (BID) | ORAL | 0 refills | Status: DC

## 2019-12-06 MED ORDER — PHENAZOPYRIDINE HCL 200 MG PO TABS
200.0000 mg | ORAL_TABLET | Freq: Four times a day (QID) | ORAL | 0 refills | Status: DC | PRN
Start: 2019-12-06 — End: 2020-02-29

## 2019-12-06 NOTE — Progress Notes (Signed)
Subjective:  Patient ID: Amber Stephenson, female    DOB: 12-26-1945  Age: 74 y.o. MRN: [id number]  CC: No chief complaint on file.   HPI Amber Stephenson presents for pressure suprapubically with urination and frequency for several days. Denies fever . She reports Bilateral flank pain. No nausea, vomiting. Just finished abx, but still going more frequently than once every two hours.    Depression screen [facility] 2/9 11/30/2019 11/29/2019 08/29/2019  Decreased Interest 0 0 0  Down, Depressed, Hopeless 0 0 0  PHQ - 2 Score 0 0 0  Altered sleeping - - -  Tired, decreased energy - - -  Change in appetite - - -  Feeling bad or failure about yourself  - - -  Trouble concentrating - - -  Moving slowly or fidgety/restless - - -  Suicidal thoughts - - -  PHQ-9 Score - - -  Difficult doing work/chores - - -  Some recent data might be hidden    History Amber Stephenson has a past medical history of Atrial fibrillation with RVR (05/19/2017), Bipolar I disorder (01/23/2015), CAD (coronary artery disease), Cardiomegaly (01/12/2018), Chronic anticoagulation (05/30/2015), Chronic back pain, Chronic diastolic CHF (congestive heart failure) (05/30/2015), Diabetes mellitus, type 2, without complication, Diabetic neuropathy (02/06/2016), Dysrhythmia, Essential hypertension, Herpes genitalis in women (07/16/2015), Hyperlipidemia, Hypertension, Hypoxia (02/01/2019), Insomnia (01/23/2015), Lipoma (02/08/2015), Major depressive disorder, Migraine headache with aura (02/12/2016), 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[place]) (04/21/2019), NAFL (nonalcoholic fatty liver) (07/06/5595), OSA (obstructive sleep apnea) (02/24/2019), Pulmonary hypertension, Renal insufficiency, and RLS (restless legs syndrome) (04/27/2015).   She has a past surgical history that includes THIGH SURGERY; Shoulder surgery (Right); Breast reduction surgery; Eye surgery (Right); Hammer toe surgery; LEFT HEART CATH AND CORONARY ANGIOGRAPHY (N/A, 02/03/2018); and  Reverse shoulder arthroplasty (Right, 08/19/2019).   Her family history includes Alcohol abuse in her sister; Alzheimer's disease in her father; Diabetes in her brother, mother, and sister; Heart disease in her brother, mother, and sister; Mental illness in her brother.She reports that she has never smoked. She has never used smokeless tobacco. She reports previous alcohol use. She reports that she does not use drugs.    ROS Review of Systems  Constitutional: Negative for chills, diaphoresis and fever.  HENT: Negative for congestion.   Eyes: Negative for visual disturbance.  Respiratory: Negative for cough and shortness of breath.   Cardiovascular: Negative for chest pain and palpitations.  Gastrointestinal: Positive for abdominal pain (LUQ, persists). Negative for constipation, diarrhea and nausea.  Genitourinary: Positive for dysuria, flank pain, frequency and urgency. Negative for decreased urine volume, hematuria, menstrual problem and pelvic pain.  Skin: Negative for rash.  Neurological: Negative for dizziness and numbness.    Objective:  There were no vitals taken for this visit.  BP Readings from Last 3 Encounters:  11/29/19 105/73  10/28/19 128/74  09/26/19 115/81    Wt Readings from Last 3 Encounters:  11/29/19 192 lb (87.1 kg)  10/28/19 191 lb (86.6 kg)  09/26/19 189 lb (85.7 kg)     Physical Exam  Exam deferred. Pt. Harboring due to COVID 19. Phone visit performed.   Assessment & Plan:   Diagnoses and all orders for this visit:  UTI symptoms -     Urinalysis, Complete -     Urine Culture  Other orders -     sulfamethoxazole-trimethoprim (BACTRIM DS) 800-160 MG tablet; Take 1 tablet by mouth 2 (two) times daily. -     phenazopyridine (PYRIDIUM) 200 MG tablet; Take 1  tablet (200 mg total) by mouth 4 (four) times daily as needed for pain (urinary frequency &/or pain).       I have discontinued Amber Stephenson. [name]'s ciprofloxacin. I am also having her  start on sulfamethoxazole-trimethoprim and phenazopyridine. Additionally, I am having her maintain her Vitamin D3, naloxone, insulin glargine, Eszopiclone, acetaminophen, Vascepa, atorvastatin, Semaglutide, potassium chloride SA, Eliquis, albuterol, ProAir HFA, metaxalone, budesonide-formoterol, diltiazem, ibuprofen, ARIPiprazole, carbamazepine, LORazepam, pregabalin, furosemide, nystatin, metoprolol tartrate, traZODone, rizatriptan, topiramate, oxyCODONE-acetaminophen, SUMAtriptan, metroNIDAZOLE, triamcinolone ointment, and metoprolol tartrate.  Allergies as of 12/06/2019      Reactions   Ivp Dye [iodinated Diagnostic Agents] Anaphylaxis, Swelling   Throat closes   Latex    Latex tape pulls skin with it      Medication List       Accurate as of December 06, 2019  1:40 PM. If you have any questions, ask your nurse or doctor.        STOP taking these medications   ciprofloxacin 500 MG tablet Commonly known as: Cipro Stopped by: [name], MD     TAKE these medications   acetaminophen 325 MG tablet Commonly known as: TYLENOL Take 325-650 mg by mouth every 6 (six) hours as needed for mild pain, moderate pain or headache.   albuterol (2.5 MG/3ML) 0.083% nebulizer solution Commonly known as: PROVENTIL Take 3 mLs (2.5 mg total) by nebulization every 6 (six) hours as needed for wheezing or shortness of breath.   ProAir HFA 108 (90 Base) MCG/ACT inhaler Generic drug: albuterol Inhale 1-2 puffs into the lungs every 6 (six) hours as needed. As needed   ARIPiprazole 10 MG tablet Commonly known as: ABILIFY Take 1 tablet (10 mg total) by mouth once for 1 dose.   atorvastatin 40 MG tablet Commonly known as: LIPITOR Take 1 tablet (40 mg total) by mouth daily.   budesonide-formoterol 80-4.5 MCG/ACT inhaler Commonly known as: Symbicort Inhale 2 puffs into the lungs 2 (two) times daily.   carbamazepine 100 MG 12 hr capsule Commonly known as: CARBATROL Take 1 capsule (100 mg total) by  mouth daily.   diltiazem 120 MG 24 hr capsule Commonly known as: CARDIZEM CD TAKE 1 CAPSULE AT BEDTIME   Eliquis 5 MG Tabs tablet Generic drug: apixaban TAKE 1 TABLET TWICE A DAY What changed: how much to take   Eszopiclone 3 MG Tabs Commonly known as: eszopiclone Take 1 tablet (3 mg total) by mouth at bedtime as needed. Take immediately before bedtime What changed: reasons to take this   furosemide 40 MG tablet Commonly known as: LASIX TAKE 1 TABLET EVERY MORNING  (DOSE INCREASED)   ibuprofen 800 MG tablet Commonly known as: ADVIL Take 1 tablet (800 mg total) by mouth every 8 (eight) hours as needed.   insulin glargine 100 UNIT/ML Solostar Pen Commonly known as: Lantus SoloStar INJECT 60 UNITS UNDER THE SKIN TWICE A DAY What changed:   how much to take  how to take this  when to take this  additional instructions   LORazepam 1 MG tablet Commonly known as: Ativan Take 1-2 tablets (1-2 mg total) by mouth See admin instructions. Take 1 tab (1 mg) in the morning and 2 tabs (2 mg ) at bedtime   metaxalone 800 MG tablet Commonly known as: Skelaxin Take 1 tablet (800 mg total) by mouth 3 (three) times daily as needed for muscle spasms.   metoprolol tartrate 25 MG tablet Commonly known as: LOPRESSOR TAKE 1 TABLET TWICE A DAY (TO BE TAKEN  ALONG WITH THE LOPRESSOR 100 MG TWICE A DAY) What changed: Another medication with the same name was changed. Make sure you understand how and when to take each. Changed by: Kate Sable, MD   metoprolol tartrate 100 MG tablet Commonly known as: LOPRESSOR TAKE 1 TABLET TWICE A DAY WITH METOPROLOL 25 MG TO EQUAL 125 MG What changed: See the new instructions. Changed by: Kate Sable, MD   metroNIDAZOLE 500 MG tablet Commonly known as: FLAGYL Take 1 tablet (500 mg total) by mouth 3 (three) times daily.   Narcan 4 MG/0.1ML Liqd nasal spray kit Generic drug: naloxone Place 1 spray into the nose once as needed (opiod  overdose).   nystatin powder Commonly known as: MYCOSTATIN/NYSTOP Apply topically 3 (three) times daily. x2 weeks (for groin/breast rash)   oxyCODONE-acetaminophen 5-325 MG tablet Commonly known as: Percocet Take 1 tablet by mouth every 6 (six) hours as needed for severe pain.   phenazopyridine 200 MG tablet Commonly known as: Pyridium Take 1 tablet (200 mg total) by mouth 4 (four) times daily as needed for pain (urinary frequency &/or pain). Started by: [name], MD   potassium chloride SA 20 MEQ tablet Commonly known as: KLOR-CON Take 1 tablet (20 mEq total) by mouth daily. What changed: when to take this   pregabalin 200 MG capsule Commonly known as: Lyrica Take 1 capsule (200 mg total) by mouth 2 (two) times daily.   rizatriptan 10 MG tablet Commonly known as: Maxalt Take 1 tablet at onset of migraine. Do not take more than 2-3 a week   Rybelsus 7 MG Tabs Generic drug: Semaglutide Take 7 mg by mouth daily.   sulfamethoxazole-trimethoprim 800-160 MG tablet Commonly known as: BACTRIM DS Take 1 tablet by mouth 2 (two) times daily. Started by: [name], MD   SUMAtriptan 100 MG tablet Commonly known as: IMITREX TAKE 1 TABLET BY MOUTH IF NEEDED FOR MIGRAINE(MAY REPEAT IN 2 HOURS IF HEADACHE PERSISTS OR RECURS)   topiramate 25 MG tablet Commonly known as: TOPAMAX Take 2 tablets in AM, 3 tablets in PM   traZODone 100 MG tablet Commonly known as: DESYREL TAKE 4 TABLETS AT BEDTIME   triamcinolone ointment 0.5 % Commonly known as: KENALOG Apply 1 application topically 2 (two) times daily as needed (skin irritation/rash.).   Vascepa 1 g capsule Generic drug: icosapent Ethyl TAKE 2 CAPSULES TWICE A DAY WITH MEALS What changed: See the new instructions.   Vitamin D3 25 MCG (1000 UT) Caps Take 1,000 Units by mouth daily.      Virtual Visit via telephone Note  I discussed the limitations, risks, security and privacy concerns of performing an evaluation  and management service by telephone and the availability of in person appointments. I also discussed with the patient that there may be a patient responsible charge related to this service. The patient expressed understanding and agreed to proceed. Pt. Is at home. Dr. Livia Snellen is in his office.  Follow Up Instructions:   I discussed the assessment and treatment plan with the patient. The patient was provided an opportunity to ask questions and all were answered. The patient agreed with the plan and demonstrated an understanding of the instructions.   The patient was advised to call back or seek an in-person evaluation if the symptoms worsen or if the condition fails to improve as anticipated.  Total minutes including chart review and phone contact time: 11   Follow-up: Return if symptoms worsen or fail to improve, for abd pain.  Cletus Gash  Viha Kriegel, M.D.

## 2019-12-06 NOTE — Telephone Encounter (Signed)
Pt would like to know if she is to con't taking furosemide if she has a UTI   Please call [phone]   Thanks renee

## 2019-12-06 NOTE — Telephone Encounter (Signed)
I told patient not to stop lasix . She verbalized understanding.

## 2019-12-08 ENCOUNTER — Other Ambulatory Visit: Payer: Self-pay | Admitting: Family Medicine

## 2019-12-08 DIAGNOSIS — Z8742 Personal history of other diseases of the female genital tract: Secondary | ICD-10-CM

## 2019-12-08 LAB — URINE CULTURE: Organism ID, Bacteria: NO GROWTH

## 2019-12-19 ENCOUNTER — Other Ambulatory Visit: Payer: Self-pay | Admitting: Family Medicine

## 2019-12-19 ENCOUNTER — Telehealth: Payer: Self-pay | Admitting: Family Medicine

## 2019-12-19 MED ORDER — AMOXICILLIN 500 MG PO CAPS: 500.0000 mg | ORAL_CAPSULE | Freq: Three times a day (TID) | ORAL | 0 refills | Status: DC

## 2019-12-19 NOTE — Telephone Encounter (Signed)
Pt states she thinks her UTI has come back and requesting an antibiotic to be sent in to [facility] [address] in [place].

## 2019-12-19 NOTE — Telephone Encounter (Signed)
Patient complains of urinary frequency and burning with urination for 2 days.  No available appts until Wednesday.  Patient would like to have antibiotic sent to pharmacy

## 2019-12-19 NOTE — Telephone Encounter (Signed)
Patient aware.

## 2019-12-19 NOTE — Telephone Encounter (Signed)
Please let the patient know that I sent their prescription to their pharmacy. Thanks, WS 

## 2019-12-22 ENCOUNTER — Telehealth: Payer: Self-pay | Admitting: Family Medicine

## 2019-12-22 ENCOUNTER — Other Ambulatory Visit: Payer: Self-pay

## 2019-12-22 ENCOUNTER — Ambulatory Visit (HOSPITAL_COMMUNITY)
Admission: RE | Admit: 2019-12-22 | Discharge: 2019-12-22 | Disposition: A | Discharge status: Home / Self Care | Payer: Medicare Other | Source: Ambulatory Visit | Attending: Family Medicine | Admitting: Family Medicine

## 2019-12-22 ENCOUNTER — Ambulatory Visit: Payer: Medicare Other | Admitting: Physician Assistant

## 2019-12-22 ENCOUNTER — Other Ambulatory Visit: Payer: Self-pay | Admitting: Family Medicine

## 2019-12-22 DIAGNOSIS — R1012 Left upper quadrant pain: Secondary | ICD-10-CM

## 2019-12-22 DIAGNOSIS — M79675 Pain in left toe(s): Secondary | ICD-10-CM | POA: Diagnosis not present

## 2019-12-22 DIAGNOSIS — K567 Ileus, unspecified: Secondary | ICD-10-CM

## 2019-12-22 DIAGNOSIS — L03032 Cellulitis of left toe: Secondary | ICD-10-CM | POA: Diagnosis not present

## 2019-12-22 MED ORDER — PREDNISONE 50 MG PO TABS
ORAL_TABLET | ORAL | 0 refills | Status: DC
Start: 2019-12-22 — End: 2020-01-17

## 2019-12-22 MED ORDER — BARIUM SULFATE 2.1 % PO SUSP
ORAL | Status: AC
  Filled 2019-12-22: qty 2

## 2019-12-22 NOTE — Telephone Encounter (Signed)
I sent in the prescription for the prednisone.  Please remind the patient to take diphenhydramine 50 mg 1 hour before the procedure.  This is simply over-the-counter Benadryl.  It usually comes in 25 mg strength so she should just take 2 of them.  Due to the potential for drowsiness she should have a driver to take her to the procedure

## 2019-12-22 NOTE — Telephone Encounter (Signed)
Pt checking status on medication being sent in for her. She is unsure of what the medication is but states Dr. Livia Snellen was sending her something in a few days ago but the pharmacy never received the medication.

## 2019-12-22 NOTE — Telephone Encounter (Signed)
Patient is scheduled for scan at [facility] on Monday.  She is allergic to the contrast dye.  She states the tech she spoke to from the hospital told her Dr. Livia Snellen would be sending in something for her.  Can you contact the hospital and find out what they are talking about and let the patient know please.

## 2019-12-22 NOTE — Telephone Encounter (Signed)
Spoke to Sylva in [place] and the following is what needs to be sent in for the patient to have scan done   50mg  prednisone PO 13,7, and 1 hour prior to appointment time and 50mg  diphenhydramine PO 1 hour prior to appointment time. Appointment time is 0830.

## 2019-12-23 ENCOUNTER — Encounter
Payer: Medicare Other | Attending: Physical Medicine and Rehabilitation | Admitting: Physical Medicine and Rehabilitation

## 2019-12-23 ENCOUNTER — Encounter: Payer: Self-pay | Admitting: Physical Medicine and Rehabilitation

## 2019-12-23 VITALS — BP 117/77 | HR 64 | Temp 97.9°F | Ht 62.0 in | Wt 185.6 lb

## 2019-12-23 DIAGNOSIS — M792 Neuralgia and neuritis, unspecified: Secondary | ICD-10-CM | POA: Diagnosis not present

## 2019-12-23 DIAGNOSIS — G894 Chronic pain syndrome: Secondary | ICD-10-CM | POA: Diagnosis not present

## 2019-12-23 DIAGNOSIS — Z79891 Long term (current) use of opiate analgesic: Secondary | ICD-10-CM | POA: Insufficient documentation

## 2019-12-23 DIAGNOSIS — M7918 Myalgia, other site: Secondary | ICD-10-CM | POA: Insufficient documentation

## 2019-12-23 DIAGNOSIS — Z5181 Encounter for therapeutic drug level monitoring: Secondary | ICD-10-CM | POA: Diagnosis not present

## 2019-12-23 DIAGNOSIS — M797 Fibromyalgia: Secondary | ICD-10-CM | POA: Diagnosis not present

## 2019-12-23 MED ORDER — METAXALONE 800 MG PO TABS: 800.0000 mg | ORAL_TABLET | Freq: Three times a day (TID) | ORAL | 3 refills | Status: DC | PRN

## 2019-12-23 MED ORDER — OXYCODONE-ACETAMINOPHEN 5-325 MG PO TABS: 1.0000 | ORAL_TABLET | Freq: Four times a day (QID) | ORAL | 0 refills | Status: DC | PRN

## 2019-12-23 NOTE — Progress Notes (Addendum)
Patient is a 74 yr old with Afib, CHF, CAD, pulmonary HTN- and DM- with CKD- Stage III here for right low back pain with sciatica  As well as upper back/neck pain/myofascial painhere for f/u.   Using Skelaxin ~ 4x/week- is helping- helps quite a bit.   Overall, if sees me monthly, feels like pain much better controlled- but has been 2 months since last visit due to vacation of physician- that made things harder.   Trazodone working well- 400 mg QHS- working great- doesn't need to change doses. Takes 4 tabs 100 mg tabs- works "really fast". No hangover effect in AM.  Can get up to void and go back to sleep and go right back to sleep.     Plan: 1. Refill Skelaxin 800 mg TID prn- 3 months supply- #270 with 3 RFs  2. Due for Refill on Percocet 5/325 4x/dya as needed- #120  3. Doesn't need trazodone refills right now.   4. Patient here for trigger point injections for  Consent done and on chart.  Cleaned areas with alcohol and injected using a 27 gauge 1.5 inch needle  Injected 4.5 cc- pt didn't want to do L side this time.  Using 1% Lidocaine with no EPI  Upper traps R only Levators R only Posterior scalenes- R only Middle scalenes Splenius Capitus R only Pectoralis Major- pt didn't want here- wasn't painful Rhomboids R only Infraspinatus Teres Major/minor Thoracic paraspinals Lumbar paraspinals R only in large TrP- 5 injections Other injections-    Patient's level of pain prior was- 8/10 Current level of pain after injections is-now 6/10-  There was no bleeding or complications.  Patient was advised to drink a lot of water on day after injections to flush system Will have increased soreness for 12-48 hours after injections.  Can use Lidocaine patches the day AFTER injections Can use theracane on day of injections in places didn't inject Can use heating pad or ice 4-6 hours AFTER injections  5. F/U in 1 month  I spent a total of 30  minutes, including TrP injections,  on visit- as detailed above.

## 2019-12-23 NOTE — Telephone Encounter (Signed)
Pt aware and voiced understanding 

## 2019-12-23 NOTE — Patient Instructions (Signed)
Plan: 1. Refill Skelaxin 800 mg TID prn- 3 months supply- #270 with 3 RFs  2. Due for Refill on Percocet 5/325 4x/dya as needed- #120  3. Doesn't need trazodone refills right now.   4. Patient here for trigger point injections for  Consent done and on chart.  Cleaned areas with alcohol and injected using a 27 gauge 1.5 inch needle  Injected 4.5 cc- pt didn't want to do L side this time.  Using 1% Lidocaine with no EPI  Upper traps R only Levators R only Posterior scalenes- R only Middle scalenes Splenius Capitus R only Pectoralis Major- pt didn't want here- wasn't painful Rhomboids R only Infraspinatus Teres Major/minor Thoracic paraspinals Lumbar paraspinals R only in large TrP- 5 injections Other injections-    Patient's level of pain prior was- 8/10 Current level of pain after injections is-now 6/10-  There was no bleeding or complications.  Patient was advised to drink a lot of water on day after injections to flush system Will have increased soreness for 12-48 hours after injections.  Can use Lidocaine patches the day AFTER injections Can use theracane on day of injections in places didn't inject Can use heating pad or ice 4-6 hours AFTER injections  5. F/U in 1 month

## 2019-12-26 ENCOUNTER — Other Ambulatory Visit: Payer: Self-pay

## 2019-12-26 ENCOUNTER — Ambulatory Visit (HOSPITAL_COMMUNITY)
Admission: RE | Admit: 2019-12-26 | Discharge: 2019-12-26 | Disposition: A | Discharge status: Home / Self Care | Payer: Medicare Other | Source: Ambulatory Visit | Attending: Family Medicine | Admitting: Family Medicine

## 2019-12-26 ENCOUNTER — Other Ambulatory Visit: Payer: Self-pay | Admitting: Family Medicine

## 2019-12-26 DIAGNOSIS — K567 Ileus, unspecified: Secondary | ICD-10-CM

## 2019-12-26 DIAGNOSIS — R1012 Left upper quadrant pain: Secondary | ICD-10-CM | POA: Diagnosis not present

## 2019-12-26 DIAGNOSIS — K5730 Diverticulosis of large intestine without perforation or abscess without bleeding: Secondary | ICD-10-CM | POA: Diagnosis not present

## 2019-12-26 DIAGNOSIS — K4090 Unilateral inguinal hernia, without obstruction or gangrene, not specified as recurrent: Secondary | ICD-10-CM | POA: Diagnosis not present

## 2019-12-28 ENCOUNTER — Other Ambulatory Visit: Payer: Self-pay

## 2019-12-28 ENCOUNTER — Ambulatory Visit (INDEPENDENT_AMBULATORY_CARE_PROVIDER_SITE_OTHER): Payer: Medicare Other | Admitting: Psychiatry

## 2019-12-28 DIAGNOSIS — F3130 Bipolar disorder, current episode depressed, mild or moderate severity, unspecified: Secondary | ICD-10-CM

## 2019-12-28 MED ORDER — ESCITALOPRAM OXALATE 10 MG PO TABS
10.0000 mg | ORAL_TABLET | Freq: Every day | ORAL | 2 refills | Status: DC
Start: 2019-12-28 — End: 2020-02-16

## 2019-12-28 NOTE — Progress Notes (Signed)
Patient ID: Amber Stephenson, female   DOB: 05-31-1946, 74 y.o.   MRN: [id number] [facility] MD Progress Note .she's going to apply 12/28/2019 4:26 PM RHEBA Stephenson  MRN:  [id number] Subjective:  Doing well Principal Problem: Bipolar disorder manic type Diagnosis: Bipolar disorder  Today the patient is seen with her husband.  Patient is not well.  For the last 3 weeks the patient has felt more depressed.  It might be related to the fact that her grandson is in trouble legally.  Is also been a problem in the last week or so has been the fifth in her kitchen remodel.  Today the patient had difficulty standing independently.  In a close evaluation the patient shared that she has had just about nothing to eat all day long.  With all the construction in her home she does not feel comfortable eating and unfortunately arrangements were made for her to eat.  Is now 4:30 in the afternoon and she had a small piece of toast first thing in the morning but nothing else.  I suspect she is somewhat dehydrated.  It also is particularly hot today outside.  When the patient leaves this office she plans to go right to a restaurant and have a full meal.  On the other hand the patient does acknowledge that she feels depressed.  She actually is sleeping well but not eating well.  She is lost over 10 pounds.  The patient is not suicidal but she does not feel good about herself.  Her husband is very supportive.  The patient is not in therapy.  Patient drinks no alcohol uses no drugs and demonstrates no psychosis at this time.  The patient denies any pain. Past Medical History:  Diagnosis Date  . Atrial fibrillation with RVR 05/19/2017  . Bipolar I disorder 01/23/2015  . CAD (coronary artery disease)    Nonobstructive; Managed by Dr. Bronson Ing  . Cardiomegaly 01/12/2018  . Chronic anticoagulation 05/30/2015  . Chronic back pain    Lower back  . Chronic diastolic CHF (congestive heart failure) 05/30/2015  . Diabetes mellitus,  type 2, without complication   . Diabetic neuropathy 02/06/2016  . Dysrhythmia    A-Fib  . Essential hypertension   . Herpes genitalis in women 07/16/2015  . Hyperlipidemia   . Hypertension   . Hypoxia 02/01/2019  . Insomnia 01/23/2015  . Lipoma 02/08/2015  . Major depressive disorder   . Migraine headache with aura 02/12/2016  . 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[place]) 04/21/2019  . NAFL (nonalcoholic fatty liver) 9/73/5329  . OSA (obstructive sleep apnea) 02/24/2019   10/09/2018 - HST  - AHI 40.6   . Pulmonary hypertension   . Renal insufficiency    Managed by Dr. Wallace Keller  . RLS (restless legs syndrome) 04/27/2015    Past Surgical History:  Procedure Laterality Date  . BREAST REDUCTION SURGERY    . EYE SURGERY Right    cateracts  . HAMMER TOE SURGERY    . LEFT HEART CATH AND CORONARY ANGIOGRAPHY N/A 02/03/2018   Procedure: LEFT HEART CATH AND CORONARY ANGIOGRAPHY;  Surgeon: Martinique, Peter M, MD;  Location: [place] [facility];  Service: Cardiovascular;  Laterality: N/A;  . REVERSE SHOULDER ARTHROPLASTY Right 08/19/2019   Procedure: REVERSE SHOULDER ARTHROPLASTY;  Surgeon: Netta Cedars, MD;  Location: [facility];  Service: Orthopedics;  Laterality: Right;  interscalene block  . SHOULDER SURGERY Right    "I BROKE MY SHOUDLER  . THIGH SURGERY     "TO  REMOVE A TUMOR "   Family History:  Family History  Problem Relation Age of Onset  . Diabetes Mother   . Heart disease Mother   . Heart disease Brother   . Heart disease Sister        CABG  . Diabetes Sister   . Alzheimer's disease Father   . Alcohol abuse Sister   . Mental illness Brother   . Diabetes Brother    Family Psychiatric  History:  Social History:  Social History   Substance and Sexual Activity  Alcohol Use Not Currently  . Alcohol/week: 0.0 standard drinks     Social History   Substance and Sexual Activity  Drug Use No    Social History   Socioeconomic History  . Marital status: Married    Spouse name: Orpah Greek   . Number of children: 3  . Years of education: 20  . Highest education level: Bachelor's degree (e.g., BA, AB, BS)  Occupational History  . Occupation: Retired    Comment: marketing  Tobacco Use  . Smoking status: Never Smoker  . Smokeless tobacco: Never Used  Vaping Use  . Vaping Use: Never used  Substance and Sexual Activity  . Alcohol use: Not Currently    Alcohol/week: 0.0 standard drinks  . Drug use: No  . Sexual activity: Yes    Partners: Male    Birth control/protection: Post-menopausal  Other Topics Concern  . Not on file  Social History Narrative   Lives at home with husband.       Right handed   Social Determinants of Health   Financial Resource Strain:   . Difficulty of Paying Living Expenses:   Food Insecurity:   . Worried About Charity fundraiser in the Last Year:   . Arboriculturist in the Last Year:   Transportation Needs:   . Film/video editor (Medical):   Marland Kitchen Lack of Transportation (Non-Medical):   Physical Activity:   . Days of Exercise per Week:   . Minutes of Exercise per Session:   Stress:   . Feeling of Stress :   Social Connections:   . Frequency of Communication with Friends and Family:   . Frequency of Social Gatherings with Friends and Family:   . Attends Religious Services:   . Active Member of Clubs or Organizations:   . Attends Archivist Meetings:   Marland Kitchen Marital Status:    Additional Social History:                         Sleep: Good  Appetite:  Good  Current Medications: Current Outpatient Medications  Medication Sig Dispense Refill  . acetaminophen (TYLENOL) 325 MG tablet Take 325-650 mg by mouth every 6 (six) hours as needed for mild pain, moderate pain or headache.     . albuterol (PROVENTIL) (2.5 MG/3ML) 0.083% nebulizer solution Take 3 mLs (2.5 mg total) by nebulization every 6 (six) hours as needed for wheezing or shortness of breath. 150 mL 1  . amoxicillin (AMOXIL) 500 MG capsule Take 1 capsule  (500 mg total) by mouth 3 (three) times daily. 21 capsule 0  . ARIPiprazole (ABILIFY) 10 MG tablet Take 1 tablet (10 mg total) by mouth once for 1 dose. 3 tablet 4  . atorvastatin (LIPITOR) 40 MG tablet Take 1 tablet (40 mg total) by mouth daily. 90 tablet 3  . budesonide-formoterol (SYMBICORT) 80-4.5 MCG/ACT inhaler Inhale 2 puffs into the lungs 2 (  two) times daily. 2 Inhaler 5  . carbamazepine (CARBATROL) 100 MG 12 hr capsule Take 1 capsule (100 mg total) by mouth daily. 90 capsule 1  . Cholecalciferol (VITAMIN D3) 1000 UNITS CAPS Take 1,000 Units by mouth daily.    Marland Kitchen diltiazem (CARDIZEM CD) 120 MG 24 hr capsule TAKE 1 CAPSULE AT BEDTIME 90 capsule 3  . ELIQUIS 5 MG TABS tablet TAKE 1 TABLET TWICE A DAY (Patient taking differently: Take 5 mg by mouth 2 (two) times daily. ) 180 tablet 3  . escitalopram (LEXAPRO) 10 MG tablet Take 1 tablet (10 mg total) by mouth daily. 30 tablet 2  . Eszopiclone (ESZOPICLONE) 3 MG TABS Take 1 tablet (3 mg total) by mouth at bedtime as needed. Take immediately before bedtime (Patient taking differently: Take 3 mg by mouth at bedtime as needed (sleep). Take immediately before bedtime) 90 tablet 1  . fluconazole (DIFLUCAN) 150 MG tablet TAKE 1 TABLET(150 MG) BY MOUTH 1 TIME FOR 1 DOSE 1 tablet 1  . furosemide (LASIX) 40 MG tablet TAKE 1 TABLET EVERY MORNING  (DOSE INCREASED) 90 tablet 3  . ibuprofen (ADVIL) 800 MG tablet Take 1 tablet (800 mg total) by mouth every 8 (eight) hours as needed. 30 tablet 5  . Insulin Glargine (LANTUS SOLOSTAR) 100 UNIT/ML Solostar Pen INJECT 60 UNITS UNDER THE SKIN TWICE A DAY (Patient taking differently: Inject 60 Units into the skin 2 (two) times daily. ) 90 mL 0  . LORazepam (ATIVAN) 1 MG tablet Take 1-2 tablets (1-2 mg total) by mouth See admin instructions. Take 1 tab (1 mg) in the morning and 2 tabs (2 mg ) at bedtime 90 tablet 4  . metaxalone (SKELAXIN) 800 MG tablet Take 1 tablet (800 mg total) by mouth 3 (three) times daily as  needed for muscle spasms. 3 months supply 270 tablet 3  . metoprolol tartrate (LOPRESSOR) 100 MG tablet TAKE 1 TABLET TWICE A DAY WITH METOPROLOL 25 MG TO EQUAL 125 MG 180 tablet 3  . metoprolol tartrate (LOPRESSOR) 25 MG tablet TAKE 1 TABLET TWICE A DAY (TO BE TAKEN ALONG WITH THE LOPRESSOR 100 MG TWICE A DAY) 180 tablet 1  . metroNIDAZOLE (FLAGYL) 500 MG tablet Take 1 tablet (500 mg total) by mouth 3 (three) times daily. 21 tablet 0  . naloxone (NARCAN) nasal spray 4 mg/0.1 mL Place 1 spray into the nose once as needed (opiod overdose).    Marland Kitchen nystatin (MYCOSTATIN/NYSTOP) powder Apply topically 3 (three) times daily. x2 weeks (for groin/breast rash) 60 g 1  . oxyCODONE-acetaminophen (PERCOCET) 5-325 MG tablet Take 1 tablet by mouth every 6 (six) hours as needed for severe pain. 120 tablet 0  . phenazopyridine (PYRIDIUM) 200 MG tablet Take 1 tablet (200 mg total) by mouth 4 (four) times daily as needed for pain (urinary frequency &/or pain). 8 tablet 0  . potassium chloride SA (KLOR-CON) 20 MEQ tablet Take 1 tablet (20 mEq total) by mouth daily. (Patient taking differently: Take 20 mEq by mouth every evening. ) 90 tablet 3  . predniSONE (DELTASONE) 50 MG tablet Take 1 tablet 13 hours before your procedure.  Take the second 7 hours before your procedure.  Take the third tablet 1 hour before your procedure 3 tablet 0  . pregabalin (LYRICA) 200 MG capsule Take 1 capsule (200 mg total) by mouth 2 (two) times daily. 60 capsule 3  . PROAIR HFA 108 (90 Base) MCG/ACT inhaler Inhale 1-2 puffs into the lungs every 6 (six) hours  as needed. As needed 18 g 11  . rizatriptan (MAXALT) 10 MG tablet Take 1 tablet at onset of migraine. Do not take more than 2-3 a week 10 tablet 5  . Semaglutide (RYBELSUS) 7 MG TABS Take 7 mg by mouth daily.     Marland Kitchen sulfamethoxazole-trimethoprim (BACTRIM DS) 800-160 MG tablet Take 1 tablet by mouth 2 (two) times daily. 14 tablet 0  . SUMAtriptan (IMITREX) 100 MG tablet TAKE 1 TABLET BY  MOUTH IF NEEDED FOR MIGRAINE(MAY REPEAT IN 2 HOURS IF HEADACHE PERSISTS OR RECURS) 10 tablet 5  . topiramate (TOPAMAX) 25 MG tablet Take 2 tablets in AM, 3 tablets in PM 150 tablet 11  . traZODone (DESYREL) 100 MG tablet TAKE 4 TABLETS AT BEDTIME 360 tablet 3  . triamcinolone ointment (KENALOG) 0.5 % Apply 1 application topically 2 (two) times daily as needed (skin irritation/rash.). 30 g 2  . VASCEPA 1 g CAPS TAKE 2 CAPSULES TWICE A DAY WITH MEALS (Patient taking differently: Take 2 g by mouth in the morning and at bedtime. ) 360 capsule 3   No current facility-administered medications for this visit.   Facility-Administered Medications Ordered in Other Visits  Medication Dose Route Frequency Provider Last Rate Last Admin  . bupivacaine-epinephrine (MARCAINE W/ EPI) 0.5% -1:200000 injection    Anesthesia Intra-op Effie Berkshire, MD   12 mL at 01/21/19 0935    Lab Results: No results found for this or any previous visit (from the past 48 hour(s)).  Blood Alcohol level:  Lab Results  Component Value Date   ETH <5 10/01/2015    Physical Findings: AIMS:  , ,  ,  ,    CIWA:    COWS:     Musculoskeletal: Strength & Muscle Tone: within normal limits Gait & Station: normal Patient leans: N/A  Psychiatric Specialty Exam: ROS Patient ID: Amber Stephenson, female   DOB: 07-02-1945, 52 y.o.   MRN: [id number] [facility] MD Progress Note .she's going to apply 12/28/2019 4:26 PM SHACORA ZYNDA  MRN:  [id number] Subjective:  Doing well Principal Problem: Bipolar disorder most recent episode manic Diagnosis:  Bipolar disorder most recent     Assessment/plan   At this time we will go ahead and add Lexapro 10 mg.  She started in the morning.  It is noted that she is on Abilify 30 mg and Tegretol 100 mg.  She also takes Lyrica 150 mg twice daily.  Today we will reduce her Ativan to taking 1 mg morning and 1 at night.  She was taking 2 mg.  Patient seems very tired and fatigued.  Patient is not  suicidal.  Her major problem is that of bipolar disorder depressed type.  Her second problem is that of insomnia and takes a small dose of trazodone.  My records over the next few months will be to taper off her Ativan as best we can.  Apparently the combination of Tegretol, Abilify and Lyrica has kept her mood very stable over the last year.  Her next visit we will go ahead and get some blood work.  She will return to see me in 4 to 5 weeks.  Her husband has agreed to contact us if anything changes.  The patient is not suicidal and I think a lot of things are about her environment.  This has to do with the stress of her grandson being in trouble, construction in her home, the pandemic and the fact that is so hot outside.  The  patient was able to walk out of her office without problem.  Her blood pressure was taking and it was generally in normal limits.  She never complained of shortness of breath or chest pain but has no overt neurological symptoms.  She appears sad to observe facial expressions.    Past Medical History:  Diagnosis Date  . Atrial fibrillation with RVR 05/19/2017  . Bipolar I disorder 01/23/2015  . CAD (coronary artery disease)    Nonobstructive; Managed by Dr. Bronson Ing  . Cardiomegaly 01/12/2018  . Chronic anticoagulation 05/30/2015  . Chronic back pain    Lower back  . Chronic diastolic CHF (congestive heart failure) 05/30/2015  . Diabetes mellitus, type 2, without complication   . Diabetic neuropathy 02/06/2016  . Dysrhythmia    A-Fib  . Essential hypertension   . Herpes genitalis in women 07/16/2015  . Hyperlipidemia   . Hypertension   . Hypoxia 02/01/2019  . Insomnia 01/23/2015  . Lipoma 02/08/2015  . Major depressive disorder   . Migraine headache with aura 02/12/2016  . 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[place]) 04/21/2019  . NAFL (nonalcoholic fatty liver) 09/01/270  . OSA (obstructive sleep apnea) 02/24/2019   10/09/2018 - HST  - AHI 40.6   . Pulmonary hypertension    . Renal insufficiency    Managed by Dr. Wallace Keller  . RLS (restless legs syndrome) 04/27/2015    Past Surgical History:  Procedure Laterality Date  . BREAST REDUCTION SURGERY    . EYE SURGERY Right    cateracts  . HAMMER TOE SURGERY    . LEFT HEART CATH AND CORONARY ANGIOGRAPHY N/A 02/03/2018   Procedure: LEFT HEART CATH AND CORONARY ANGIOGRAPHY;  Surgeon: Martinique, Peter M, MD;  Location: [place] [facility];  Service: Cardiovascular;  Laterality: N/A;  . REVERSE SHOULDER ARTHROPLASTY Right 08/19/2019   Procedure: REVERSE SHOULDER ARTHROPLASTY;  Surgeon: Netta Cedars, MD;  Location: [facility];  Service: Orthopedics;  Laterality: Right;  interscalene block  . SHOULDER SURGERY Right    "I BROKE MY SHOUDLER  . THIGH SURGERY     "TO REMOVE A TUMOR "   Family History:  Family History  Problem Relation Age of Onset  . Diabetes Mother   . Heart disease Mother   . Heart disease Brother   . Heart disease Sister        CABG  . Diabetes Sister   . Alzheimer's disease Father   . Alcohol abuse Sister   . Mental illness Brother   . Diabetes Brother    Family Psychiatric  History:  Social History:  Social History   Substance and Sexual Activity  Alcohol Use Not Currently  . Alcohol/week: 0.0 standard drinks     Social History   Substance and Sexual Activity  Drug Use No    Social History   Socioeconomic History  . Marital status: Married    Spouse name: Orpah Greek  . Number of children: 3  . Years of education: 1  . Highest education level: Bachelor's degree (e.g., BA, AB, BS)  Occupational History  . Occupation: Retired    Comment: marketing  Tobacco Use  . Smoking status: Never Smoker  . Smokeless tobacco: Never Used  Vaping Use  . Vaping Use: Never used  Substance and Sexual Activity  . Alcohol use: Not Currently    Alcohol/week: 0.0 standard drinks  . Drug use: No  . Sexual activity: Yes    Partners: Male    Birth control/protection: Post-menopausal  Other Topics Concern   .  Not on file  Social History Narrative   Lives at home with husband.       Right handed   Social Determinants of Health   Financial Resource Strain:   . Difficulty of Paying Living Expenses:   Food Insecurity:   . Worried About Charity fundraiser in the Last Year:   . Arboriculturist in the Last Year:   Transportation Needs:   . Film/video editor (Medical):   Marland Kitchen Lack of Transportation (Non-Medical):   Physical Activity:   . Days of Exercise per Week:   . Minutes of Exercise per Session:   Stress:   . Feeling of Stress :   Social Connections:   . Frequency of Communication with Friends and Family:   . Frequency of Social Gatherings with Friends and Family:   . Attends Religious Services:   . Active Member of Clubs or Organizations:   . Attends Archivist Meetings:   Marland Kitchen Marital Status:    Additional Social History:                         Sleep: Good  Appetite:  Good  Current Medications: Current Outpatient Medications  Medication Sig Dispense Refill  . acetaminophen (TYLENOL) 325 MG tablet Take 325-650 mg by mouth every 6 (six) hours as needed for mild pain, moderate pain or headache.     . albuterol (PROVENTIL) (2.5 MG/3ML) 0.083% nebulizer solution Take 3 mLs (2.5 mg total) by nebulization every 6 (six) hours as needed for wheezing or shortness of breath. 150 mL 1  . amoxicillin (AMOXIL) 500 MG capsule Take 1 capsule (500 mg total) by mouth 3 (three) times daily. 21 capsule 0  . ARIPiprazole (ABILIFY) 10 MG tablet Take 1 tablet (10 mg total) by mouth once for 1 dose. 3 tablet 4  . atorvastatin (LIPITOR) 40 MG tablet Take 1 tablet (40 mg total) by mouth daily. 90 tablet 3  . budesonide-formoterol (SYMBICORT) 80-4.5 MCG/ACT inhaler Inhale 2 puffs into the lungs 2 (two) times daily. 2 Inhaler 5  . carbamazepine (CARBATROL) 100 MG 12 hr capsule Take 1 capsule (100 mg total) by mouth daily. 90 capsule 1  . Cholecalciferol (VITAMIN D3) 1000 UNITS  CAPS Take 1,000 Units by mouth daily.    Marland Kitchen diltiazem (CARDIZEM CD) 120 MG 24 hr capsule TAKE 1 CAPSULE AT BEDTIME 90 capsule 3  . ELIQUIS 5 MG TABS tablet TAKE 1 TABLET TWICE A DAY (Patient taking differently: Take 5 mg by mouth 2 (two) times daily. ) 180 tablet 3  . escitalopram (LEXAPRO) 10 MG tablet Take 1 tablet (10 mg total) by mouth daily. 30 tablet 2  . Eszopiclone (ESZOPICLONE) 3 MG TABS Take 1 tablet (3 mg total) by mouth at bedtime as needed. Take immediately before bedtime (Patient taking differently: Take 3 mg by mouth at bedtime as needed (sleep). Take immediately before bedtime) 90 tablet 1  . fluconazole (DIFLUCAN) 150 MG tablet TAKE 1 TABLET(150 MG) BY MOUTH 1 TIME FOR 1 DOSE 1 tablet 1  . furosemide (LASIX) 40 MG tablet TAKE 1 TABLET EVERY MORNING  (DOSE INCREASED) 90 tablet 3  . ibuprofen (ADVIL) 800 MG tablet Take 1 tablet (800 mg total) by mouth every 8 (eight) hours as needed. 30 tablet 5  . Insulin Glargine (LANTUS SOLOSTAR) 100 UNIT/ML Solostar Pen INJECT 60 UNITS UNDER THE SKIN TWICE A DAY (Patient taking differently: Inject 60 Units into the skin 2 (two)  times daily. ) 90 mL 0  . LORazepam (ATIVAN) 1 MG tablet Take 1-2 tablets (1-2 mg total) by mouth See admin instructions. Take 1 tab (1 mg) in the morning and 2 tabs (2 mg ) at bedtime 90 tablet 4  . metaxalone (SKELAXIN) 800 MG tablet Take 1 tablet (800 mg total) by mouth 3 (three) times daily as needed for muscle spasms. 3 months supply 270 tablet 3  . metoprolol tartrate (LOPRESSOR) 100 MG tablet TAKE 1 TABLET TWICE A DAY WITH METOPROLOL 25 MG TO EQUAL 125 MG 180 tablet 3  . metoprolol tartrate (LOPRESSOR) 25 MG tablet TAKE 1 TABLET TWICE A DAY (TO BE TAKEN ALONG WITH THE LOPRESSOR 100 MG TWICE A DAY) 180 tablet 1  . metroNIDAZOLE (FLAGYL) 500 MG tablet Take 1 tablet (500 mg total) by mouth 3 (three) times daily. 21 tablet 0  . naloxone (NARCAN) nasal spray 4 mg/0.1 mL Place 1 spray into the nose once as needed (opiod  overdose).    Marland Kitchen nystatin (MYCOSTATIN/NYSTOP) powder Apply topically 3 (three) times daily. x2 weeks (for groin/breast rash) 60 g 1  . oxyCODONE-acetaminophen (PERCOCET) 5-325 MG tablet Take 1 tablet by mouth every 6 (six) hours as needed for severe pain. 120 tablet 0  . phenazopyridine (PYRIDIUM) 200 MG tablet Take 1 tablet (200 mg total) by mouth 4 (four) times daily as needed for pain (urinary frequency &/or pain). 8 tablet 0  . potassium chloride SA (KLOR-CON) 20 MEQ tablet Take 1 tablet (20 mEq total) by mouth daily. (Patient taking differently: Take 20 mEq by mouth every evening. ) 90 tablet 3  . predniSONE (DELTASONE) 50 MG tablet Take 1 tablet 13 hours before your procedure.  Take the second 7 hours before your procedure.  Take the third tablet 1 hour before your procedure 3 tablet 0  . pregabalin (LYRICA) 200 MG capsule Take 1 capsule (200 mg total) by mouth 2 (two) times daily. 60 capsule 3  . PROAIR HFA 108 (90 Base) MCG/ACT inhaler Inhale 1-2 puffs into the lungs every 6 (six) hours as needed. As needed 18 g 11  . rizatriptan (MAXALT) 10 MG tablet Take 1 tablet at onset of migraine. Do not take more than 2-3 a week 10 tablet 5  . Semaglutide (RYBELSUS) 7 MG TABS Take 7 mg by mouth daily.     Marland Kitchen sulfamethoxazole-trimethoprim (BACTRIM DS) 800-160 MG tablet Take 1 tablet by mouth 2 (two) times daily. 14 tablet 0  . SUMAtriptan (IMITREX) 100 MG tablet TAKE 1 TABLET BY MOUTH IF NEEDED FOR MIGRAINE(MAY REPEAT IN 2 HOURS IF HEADACHE PERSISTS OR RECURS) 10 tablet 5  . topiramate (TOPAMAX) 25 MG tablet Take 2 tablets in AM, 3 tablets in PM 150 tablet 11  . traZODone (DESYREL) 100 MG tablet TAKE 4 TABLETS AT BEDTIME 360 tablet 3  . triamcinolone ointment (KENALOG) 0.5 % Apply 1 application topically 2 (two) times daily as needed (skin irritation/rash.). 30 g 2  . VASCEPA 1 g CAPS TAKE 2 CAPSULES TWICE A DAY WITH MEALS (Patient taking differently: Take 2 g by mouth in the morning and at bedtime. ) 360  capsule 3   No current facility-administered medications for this visit.   Facility-Administered Medications Ordered in Other Visits  Medication Dose Route Frequency Provider Last Rate Last Admin  . bupivacaine-epinephrine (MARCAINE W/ EPI) 0.5% -1:200000 injection    Anesthesia Intra-op Effie Berkshire, MD   12 mL at 01/21/19 0935    Lab Results: No results found  for this or any previous visit (from the past 48 hour(s)).  Blood Alcohol level:  Lab Results  Component Value Date   ETH <5 10/01/2015    Physical Findings: AIMS:  , ,  ,  ,    CIWA:    COWS:     Musculoskeletal: Strength & Muscle Tone: within normal limits Gait & Station: normal Patient leans: N/A  Psychiatric Specialty Exam: ROS  There were no vitals taken for this visit.There is no height or weight on file to calculate BMI.  General Appearance: Casual  Eye Contact::  Good  Speech:  Clear and Coherent  Volume:  Normal  Mood:  Euthymic  Affect:  Appropriate  Thought Process:  Coherent  Orientation:  Full (Time, Place, and Person)  Thought Content:  WDL  Suicidal Thoughts:  No  Homicidal Thoughts:  No  Memory:  NA  Judgement:  Good  Insight:  Good  Psychomotor Activity:  Normal  Concentration:  Good  Recall:  Good  Fund of Knowledge:Good  Language: Good  Akathisia:  No  Handed:  Right  AIMS (if indicated):     Assets: Good spirits   ADL's:  Intact  Cognition: WNL  Sleep:     eks Treatment Plan Summary: This patient's first admission important problem is bipolar disorder. She takes multiple medications for this condition. This includes Tegretol and Abilify. At this time the patient is not in therapy. She'll make an attempt to get a therapist in her community. She's taking a moderate to high dose of Ativan 1 mg one in the morning and 2 at night and requested more of it. At this time we will not increase the dose of Ativan. We will have her response to her second problem which is her problems  sleeping. She takes 3 trazodone to sleep and doesn't stay asleep. At this time she'll continue taking the trazodone but she'll begin on doxepin 50 mg in addition. Today we spent more than 50% of the time talking about her medications and educating her. She understands that she cannot drink alcohol with her medications and she'll actually does not. The patient stays busy and active. She does importance of being in therapy and she'll actively looking for a therapist at this time. She'll return to see me in 3 months for a 30 minute visit. At that time consider getting a Tegretol blood level and other blood work.patient denies any physical complaints of chest pain shortness of breath.  There were no vitals taken for this visit.There is no height or weight on file to calculate BMI.  General Appearance: Casual  Eye Contact::  Good  Speech:  Clear and Coherent  Volume:  Normal  Mood:  Euthymic  Affect:  Appropriate  Thought Process:  Coherent  Orientation:  Full (Time, Place, and Person)  Thought Content:  WDL  Suicidal Thoughts:  No  Homicidal Thoughts:  No  Memory:  NA  Judgement:  Good  Insight:  Good  Psychomotor Activity:  Normal  Concentration:  Good  Recall:  Good  Fund of Knowledge:Good  Language: Good  Akathisia:  No  Handed:  Right  AIMS (if indicated):     Assets: Good spirits   ADL's:  Intact  Cognition: WNL  Sleep:     eks Treatment Plan Summary:  Today the patient is doing well.  It is noted that she is not had any blood work in a while.  We will go ahead and make arranges for her to get  it at [facility].  The patient will continue taking Tegretol 100 mg once a day.  This is for her first problem of bipolar disorder.  At this time she is doing great.  Should continue taking her Abilify as prescribed as well.  Her second problem is that of insomnia.  Should continue taking trazodone which we will increase to 100 mg for today.  She no longer takes doxepin.  Patient will continue  taking Ativan 1 mg 1 in the morning and 2 at night.  I suspect overall this helps both her bipolar state as well as her insomnia.  Patient will also continue taking Lunesta 3 mg as prescribed.  This patient will be seen again in 3 months.  I believe she is very stable.  She is functioning well.  The blood work the patient needs is a morning Tegretol level, comprehensive metabolic panel and a CBC.

## 2019-12-29 ENCOUNTER — Other Ambulatory Visit: Payer: Self-pay | Admitting: Family Medicine

## 2019-12-29 ENCOUNTER — Telehealth: Payer: Self-pay | Admitting: Family Medicine

## 2019-12-29 DIAGNOSIS — K4090 Unilateral inguinal hernia, without obstruction or gangrene, not specified as recurrent: Secondary | ICD-10-CM

## 2019-12-30 NOTE — Telephone Encounter (Signed)
Attempted to contact patient - NA Per note provider discussed report with patient.

## 2020-01-11 ENCOUNTER — Encounter
Payer: Medicare Other | Attending: Physical Medicine and Rehabilitation | Admitting: Physical Medicine and Rehabilitation

## 2020-01-11 ENCOUNTER — Encounter: Payer: Self-pay | Admitting: Physical Medicine and Rehabilitation

## 2020-01-11 ENCOUNTER — Other Ambulatory Visit: Payer: Self-pay

## 2020-01-11 VITALS — BP 105/63 | HR 71 | Temp 97.9°F | Ht 62.0 in | Wt 186.2 lb

## 2020-01-11 DIAGNOSIS — M25511 Pain in right shoulder: Secondary | ICD-10-CM | POA: Diagnosis not present

## 2020-01-11 DIAGNOSIS — M797 Fibromyalgia: Secondary | ICD-10-CM | POA: Insufficient documentation

## 2020-01-11 DIAGNOSIS — Z5181 Encounter for therapeutic drug level monitoring: Secondary | ICD-10-CM | POA: Insufficient documentation

## 2020-01-11 DIAGNOSIS — M7918 Myalgia, other site: Secondary | ICD-10-CM | POA: Insufficient documentation

## 2020-01-11 DIAGNOSIS — M792 Neuralgia and neuritis, unspecified: Secondary | ICD-10-CM | POA: Insufficient documentation

## 2020-01-11 DIAGNOSIS — Z79891 Long term (current) use of opiate analgesic: Secondary | ICD-10-CM | POA: Insufficient documentation

## 2020-01-11 DIAGNOSIS — G894 Chronic pain syndrome: Secondary | ICD-10-CM | POA: Diagnosis not present

## 2020-01-11 MED ORDER — OXYCODONE-ACETAMINOPHEN 5-325 MG PO TABS: 1.0000 | ORAL_TABLET | Freq: Four times a day (QID) | ORAL | 0 refills | Status: DC | PRN

## 2020-01-11 NOTE — Progress Notes (Signed)
Subjective:    Patient ID: Amber Stephenson, female    DOB: Oct 14, 1945, 74 y.o.   MRN: [id number]  HPI   CAD,pulmonary HTN-and DM- with CKD- Stage III here for right low back pain with sciaticaAs well as upper back/neck pain/myofascial pain here forf/u.   So, had problems with pain moving over the other side once injected  Just R side.   No additional issues- still back bothering her.  Hips are bothering her from the way L to way to R.   Worried, going on vacation.      Pain Inventory Average Pain 7 Pain Right Now 6 My pain is sharp and tingling  In the last 24 hours, has pain interfered with the following? General activity 7 Relation with others 2 Enjoyment of life 4 What TIME of day is your pain at its worst? morning Sleep (in general) Fair  Pain is worse with: bending and sitting Pain improves with: rest, heat/ice, medication, TENS and injections Relief from Meds: 7  Mobility how many minutes can you walk? 30-45 ability to climb steps?  yes do you drive?  no  Function retired I need assistance with the following:  dressing  Neuro/Psych tremor tingling spasms dizziness confusion  Prior Studies x-rays CT/MRI  Physicians involved in your care [place]   Family History  Problem Relation Age of Onset  . Diabetes Mother   . Heart disease Mother   . Heart disease Brother   . Heart disease Sister        CABG  . Diabetes Sister   . Alzheimer's disease Father   . Alcohol abuse Sister   . Mental illness Brother   . Diabetes Brother    Social History   Socioeconomic History  . Marital status: Married    Spouse name: Orpah Greek  . Number of children: 3  . Years of education: 83  . Highest education level: Bachelor's degree (e.g., BA, AB, BS)  Occupational History  . Occupation: Retired    Comment: marketing  Tobacco Use  . Smoking status: Never Smoker  . Smokeless tobacco: Never Used  Vaping Use  . Vaping Use: Never used   Substance and Sexual Activity  . Alcohol use: Not Currently    Alcohol/week: 0.0 standard drinks  . Drug use: No  . Sexual activity: Yes    Partners: Male    Birth control/protection: Post-menopausal  Other Topics Concern  . Not on file  Social History Narrative   Lives at home with husband.       Right handed   Social Determinants of Health   Financial Resource Strain:   . Difficulty of Paying Living Expenses:   Food Insecurity:   . Worried About Charity fundraiser in the Last Year:   . Arboriculturist in the Last Year:   Transportation Needs:   . Film/video editor (Medical):   Marland Kitchen Lack of Transportation (Non-Medical):   Physical Activity:   . Days of Exercise per Week:   . Minutes of Exercise per Session:   Stress:   . Feeling of Stress :   Social Connections:   . Frequency of Communication with Friends and Family:   . Frequency of Social Gatherings with Friends and Family:   . Attends Religious Services:   . Active Member of Clubs or Organizations:   . Attends Archivist Meetings:   Marland Kitchen Marital Status:    Past Surgical History:  Procedure Laterality Date  . BREAST  REDUCTION SURGERY    . EYE SURGERY Right    cateracts  . HAMMER TOE SURGERY    . LEFT HEART CATH AND CORONARY ANGIOGRAPHY N/A 02/03/2018   Procedure: LEFT HEART CATH AND CORONARY ANGIOGRAPHY;  Surgeon: Martinique, Peter M, MD;  Location: [facility];  Service: Cardiovascular;  Laterality: N/A;  . REVERSE SHOULDER ARTHROPLASTY Right 08/19/2019   Procedure: REVERSE SHOULDER ARTHROPLASTY;  Surgeon: Netta Cedars, MD;  Location: [facility];  Service: Orthopedics;  Laterality: Right;  interscalene block  . SHOULDER SURGERY Right    "I BROKE MY SHOUDLER  . THIGH SURGERY     "TO REMOVE A TUMOR "   Past Medical History:  Diagnosis Date  . Atrial fibrillation with RVR 05/19/2017  . Bipolar I disorder 01/23/2015  . CAD (coronary artery disease)    Nonobstructive; Managed by Dr. Bronson Ing  .  Cardiomegaly 01/12/2018  . Chronic anticoagulation 05/30/2015  . Chronic back pain    Lower back  . Chronic diastolic CHF (congestive heart failure) 05/30/2015  . Diabetes mellitus, type 2, without complication   . Diabetic neuropathy 02/06/2016  . Dysrhythmia    A-Fib  . Essential hypertension   . Herpes genitalis in women 07/16/2015  . Hyperlipidemia   . Hypertension   . Hypoxia 02/01/2019  . Insomnia 01/23/2015  . Lipoma 02/08/2015  . Major depressive disorder   . Migraine headache with aura 02/12/2016  . 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[place]) 04/21/2019  . NAFL (nonalcoholic fatty liver) 09/02/4740  . OSA (obstructive sleep apnea) 02/24/2019   10/09/2018 - HST  - AHI 40.6   . Pulmonary hypertension   . Renal insufficiency    Managed by Dr. Wallace Keller  . RLS (restless legs syndrome) 04/27/2015   BP 105/63   Pulse 71   Temp 97.9 F (36.6 C)   Ht 5\' 2"  (1.575 m)   Wt 186 lb 3.2 oz (84.5 kg)   SpO2 93%   BMI 34.06 kg/m   Opioid Risk Score:   Fall Risk Score:  `1  Depression screen PHQ 2/9  Depression screen [facility] 2/9 12/23/2019 11/30/2019 11/29/2019 08/29/2019 08/22/2019 08/08/2019 06/27/2019  Decreased Interest 0 0 0 0 0 0 0  Down, Depressed, Hopeless 0 0 0 0 0 0 0  PHQ - 2 Score 0 0 0 0 0 0 0  Altered sleeping - - - - 0 - 0  Tired, decreased energy - - - - 3 - 0  Change in appetite - - - - 0 - 0  Feeling bad or failure about yourself  - - - - 0 - 0  Trouble concentrating - - - - 0 - 0  Moving slowly or fidgety/restless - - - - 0 - 0  Suicidal thoughts - - - - 0 - 0  PHQ-9 Score - - - - 3 - 0  Difficult doing work/chores - - - - Somewhat difficult - -  Some recent data might be hidden    Review of Systems  Musculoskeletal:       Spasms  Neurological: Positive for dizziness and tremors.       Tingling  Psychiatric/Behavioral: Positive for confusion.       Objective:   Physical Exam Awake, alert, appropriate, accompanied by husband, on table, NAD Tight trigger points  per injection locations esp low back across in band- TTP       Assessment & Plan:   CAD,pulmonary HTN-and DM- with CKD- Stage III here for right low back pain  with sciaticaAs well as upper back/neck pain/myofascial pain here forf/u.   1. Will refill Percocet a little early- can get it filled as of 8/23- wrote on Rx that can per my office to get 2 days early- should be no issue, but suggest they call prior to verify.    2. Doesn't need trazodone refills.    3. Trigger point injections Patient here for trigger point injections for  Consent done and on chart.  Cleaned areas with alcohol and injected using a 27 gauge 1.5 inch needle  Injected 6cc Using 1% Lidocaine with no EPI  Upper traps B/L Levators B/L Posterior scalenes B/L Middle scalenes Splenius Capitus Pectoralis Major Rhomboids B/L x2 Infraspinatus Teres Major/minor Thoracic paraspinals B/L Lumbar paraspinals B/L x3 Other injections-    Patient's level of pain prior was 7/10 Current level of pain after injections is- little dizzy, but doing well; ~ 5-6/10- just because needles.   There was no bleeding (needed 2 bandaids) or complications.  Patient was advised to drink a lot of water on day after injections to flush system Will have increased soreness for 12-48 hours after injections.  Can use Lidocaine patches the day AFTER injections Can use theracane on day of injections in places didn't inject Can use heating pad 4-6 hours AFTER injections  4. F/U in 79month  I spent a total of 30 minutes on visit- as detailed above.

## 2020-01-11 NOTE — Patient Instructions (Signed)
1. Will refill Percocet a little early- can get it filled as of 8/23- wrote on Rx that can per my office to get 2 days early- should be no issue, but suggest they call prior to verify.    2. Doesn't need trazodone refills.    3. Trigger point injections Patient here for trigger point injections for  Consent done and on chart.  Cleaned areas with alcohol and injected using a 27 gauge 1.5 inch needle  Injected 6cc Using 1% Lidocaine with no EPI  Upper traps B/L Levators B/L Posterior scalenes B/L Middle scalenes Splenius Capitus Pectoralis Major Rhomboids B/L x2 Infraspinatus Teres Major/minor Thoracic paraspinals B/L Lumbar paraspinals B/L x3 Other injections-    Patient's level of pain prior was 7/10 Current level of pain after injections is- little dizzy, but doing well; ~ 5-6/10- just because needles.   There was no bleeding (needed 2 bandaids) or complications.  Patient was advised to drink a lot of water on day after injections to flush system Will have increased soreness for 12-48 hours after injections.  Can use Lidocaine patches the day AFTER injections Can use theracane on day of injections in places didn't inject Can use heating pad 4-6 hours AFTER injections  4. F/U in 88month

## 2020-01-17 ENCOUNTER — Ambulatory Visit (INDEPENDENT_AMBULATORY_CARE_PROVIDER_SITE_OTHER): Payer: Medicare Other | Admitting: General Surgery

## 2020-01-17 ENCOUNTER — Encounter: Payer: Self-pay | Admitting: General Surgery

## 2020-01-17 ENCOUNTER — Other Ambulatory Visit: Payer: Self-pay

## 2020-01-17 VITALS — BP 96/56 | HR 75 | Temp 97.9°F | Resp 12 | Ht 62.0 in | Wt 184.0 lb

## 2020-01-17 DIAGNOSIS — K4090 Unilateral inguinal hernia, without obstruction or gangrene, not specified as recurrent: Secondary | ICD-10-CM | POA: Diagnosis not present

## 2020-01-17 NOTE — Patient Instructions (Signed)
Diverticulosis  Diverticulosis is a condition that develops when small pouches (diverticula) form in the wall of the large intestine (colon). The colon is where water is absorbed and stool (feces) is formed. The pouches form when the inside layer of the colon pushes through weak spots in the outer layers of the colon. You may have a few pouches or many of them. The pouches usually do not cause problems unless they become inflamed or infected. When this happens, the condition is called diverticulitis. What are the causes? The cause of this condition is not known. What increases the risk? The following factors may make you more likely to develop this condition:  Being older than age 18. Your risk for this condition increases with age. Diverticulosis is rare among people younger than age 47. By age 73, many people have it.  Eating a low-fiber diet.  Having frequent constipation.  Being overweight.  Not getting enough exercise.  Smoking.  Taking over-the-counter pain medicines, like aspirin and ibuprofen.  Having a family history of diverticulosis. What are the signs or symptoms? In most people, there are no symptoms of this condition. If you do have symptoms, they may include:  Bloating.  Cramps in the abdomen.  Constipation or diarrhea.  Pain in the lower left side of the abdomen. How is this diagnosed? Because diverticulosis usually has no symptoms, it is most often diagnosed during an exam for other colon problems. The condition may be diagnosed by:  Using a flexible scope to examine the colon (colonoscopy).  Taking an X-ray of the colon after dye has been put into the colon (barium enema).  Having a CT scan. How is this treated? You may not need treatment for this condition. Your health care provider may recommend treatment to prevent problems. You may need treatment if you have symptoms or if you previously had diverticulitis. Treatment may include:  Eating a high-fiber  diet.  Taking a fiber supplement.  Taking a live bacteria supplement (probiotic).  Taking medicine to relax your colon. Follow these instructions at home: Medicines  Take over-the-counter and prescription medicines only as told by your health care provider.  If told by your health care provider, take a fiber supplement or probiotic. Constipation prevention Your condition may cause constipation. To prevent or treat constipation, you may need to:  Drink enough fluid to keep your urine pale yellow.  Take over-the-counter or prescription medicines.  Eat foods that are high in fiber, such as beans, whole grains, and fresh fruits and vegetables.  Limit foods that are high in fat and processed sugars, such as fried or sweet foods.  General instructions  Try not to strain when you have a bowel movement.  Keep all follow-up visits as told by your health care provider. This is important. Contact a health care provider if you:  Have pain in your abdomen.  Have bloating.  Have cramps.  Have not had a bowel movement in 3 days. Get help right away if:  Your pain gets worse.  Your bloating becomes very bad.  You have a fever or chills, and your symptoms suddenly get worse.  You vomit.  You have bowel movements that are bloody or black.  You have bleeding from your rectum. Summary  Diverticulosis is a condition that develops when small pouches (diverticula) form in the wall of the large intestine (colon).  You may have a few pouches or many of them.  This condition is most often diagnosed during an exam for other colon  problems.  Treatment may include increasing the fiber in your diet, taking supplements, or taking medicines. This information is not intended to replace advice given to you by your health care provider. Make sure you discuss any questions you have with your health care provider. Document Revised: 12/16/2018 Document Reviewed: 12/16/2018 Elsevier Patient  Education  2020 Elsevier Inc.  

## 2020-01-18 NOTE — Progress Notes (Signed)
Amber Stephenson; [id number]; Nov 03, 1945   HPI Patient is a 74 year old white female who was referred to my care by Dr. Livia Snellen for evaluation and treatment of a right inguinal hernia.  This was found on CT scan of the abdomen while undergoing work-up for left-sided abdominal pain.  She was noted to have diverticulosis on that x-ray.  She was also noted to have a fat-containing right inguinal hernia.  She denies any pain in the right groin region.  She is on Eliquis for history of atrial fibrillation. Past Medical History:  Diagnosis Date  . Atrial fibrillation with RVR 05/19/2017  . Bipolar I disorder 01/23/2015  . CAD (coronary artery disease)    Nonobstructive; Managed by Dr. Bronson Ing  . Cardiomegaly 01/12/2018  . Chronic anticoagulation 05/30/2015  . Chronic back pain    Lower back  . Chronic diastolic CHF (congestive heart failure) 05/30/2015  . Diabetes mellitus, type 2, without complication   . Diabetic neuropathy 02/06/2016  . Dysrhythmia    A-Fib  . Essential hypertension   . Herpes genitalis in women 07/16/2015  . Hyperlipidemia   . Hypertension   . Hypoxia 02/01/2019  . Insomnia 01/23/2015  . Lipoma 02/08/2015  . Major depressive disorder   . Migraine headache with aura 02/12/2016  . 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[place]) 04/21/2019  . NAFL (nonalcoholic fatty liver) 5/80/9983  . OSA (obstructive sleep apnea) 02/24/2019   10/09/2018 - HST  - AHI 40.6   . Pulmonary hypertension   . Renal insufficiency    Managed by Dr. Wallace Keller  . RLS (restless legs syndrome) 04/27/2015    Past Surgical History:  Procedure Laterality Date  . BREAST REDUCTION SURGERY    . EYE SURGERY Right    cateracts  . HAMMER TOE SURGERY    . LEFT HEART CATH AND CORONARY ANGIOGRAPHY N/A 02/03/2018   Procedure: LEFT HEART CATH AND CORONARY ANGIOGRAPHY;  Surgeon: Martinique, Peter M, MD;  Location: [facility];  Service: Cardiovascular;  Laterality: N/A;  . REVERSE SHOULDER ARTHROPLASTY Right 08/19/2019    Procedure: REVERSE SHOULDER ARTHROPLASTY;  Surgeon: Netta Cedars, MD;  Location: [facility];  Service: Orthopedics;  Laterality: Right;  interscalene block  . SHOULDER SURGERY Right    "I BROKE MY SHOUDLER  . THIGH SURGERY     "TO REMOVE A TUMOR "    Family History  Problem Relation Age of Onset  . Diabetes Mother   . Heart disease Mother   . Heart disease Brother   . Heart disease Sister        CABG  . Diabetes Sister   . Alzheimer's disease Father   . Alcohol abuse Sister   . Mental illness Brother   . Diabetes Brother     Current Outpatient Medications on File Prior to Visit  Medication Sig Dispense Refill  . acetaminophen (TYLENOL) 325 MG tablet Take 325-650 mg by mouth every 6 (six) hours as needed for mild pain, moderate pain or headache.     . albuterol (PROVENTIL) (2.5 MG/3ML) 0.083% nebulizer solution Take 3 mLs (2.5 mg total) by nebulization every 6 (six) hours as needed for wheezing or shortness of breath. 150 mL 1  . atorvastatin (LIPITOR) 40 MG tablet Take 1 tablet (40 mg total) by mouth daily. 90 tablet 3  . budesonide-formoterol (SYMBICORT) 80-4.5 MCG/ACT inhaler Inhale 2 puffs into the lungs 2 (two) times daily. 2 Inhaler 5  . carbamazepine (CARBATROL) 100 MG 12 hr capsule Take 1 capsule (100 mg total) by  mouth daily. 90 capsule 1  . Cholecalciferol (VITAMIN D3) 1000 UNITS CAPS Take 1,000 Units by mouth daily.    Marland Kitchen diltiazem (CARDIZEM CD) 120 MG 24 hr capsule TAKE 1 CAPSULE AT BEDTIME 90 capsule 3  . ELIQUIS 5 MG TABS tablet TAKE 1 TABLET TWICE A DAY (Patient taking differently: Take 5 mg by mouth 2 (two) times daily. ) 180 tablet 3  . escitalopram (LEXAPRO) 10 MG tablet Take 1 tablet (10 mg total) by mouth daily. 30 tablet 2  . fluconazole (DIFLUCAN) 150 MG tablet TAKE 1 TABLET(150 MG) BY MOUTH 1 TIME FOR 1 DOSE 1 tablet 1  . furosemide (LASIX) 40 MG tablet TAKE 1 TABLET EVERY MORNING  (DOSE INCREASED) 90 tablet 3  . ibuprofen (ADVIL) 800 MG tablet Take 1 tablet  (800 mg total) by mouth every 8 (eight) hours as needed. 30 tablet 5  . Insulin Glargine (LANTUS SOLOSTAR) 100 UNIT/ML Solostar Pen INJECT 60 UNITS UNDER THE SKIN TWICE A DAY (Patient taking differently: Inject 60 Units into the skin 2 (two) times daily. ) 90 mL 0  . LORazepam (ATIVAN) 1 MG tablet Take 1-2 tablets (1-2 mg total) by mouth See admin instructions. Take 1 tab (1 mg) in the morning and 2 tabs (2 mg ) at bedtime 90 tablet 4  . metaxalone (SKELAXIN) 800 MG tablet Take 1 tablet (800 mg total) by mouth 3 (three) times daily as needed for muscle spasms. 3 months supply 270 tablet 3  . metoprolol tartrate (LOPRESSOR) 100 MG tablet TAKE 1 TABLET TWICE A DAY WITH METOPROLOL 25 MG TO EQUAL 125 MG 180 tablet 3  . metoprolol tartrate (LOPRESSOR) 25 MG tablet TAKE 1 TABLET TWICE A DAY (TO BE TAKEN ALONG WITH THE LOPRESSOR 100 MG TWICE A DAY) 180 tablet 1  . metroNIDAZOLE (FLAGYL) 500 MG tablet Take 1 tablet (500 mg total) by mouth 3 (three) times daily. 21 tablet 0  . naloxone (NARCAN) nasal spray 4 mg/0.1 mL Place 1 spray into the nose once as needed (opiod overdose).    Marland Kitchen nystatin (MYCOSTATIN/NYSTOP) powder Apply topically 3 (three) times daily. x2 weeks (for groin/breast rash) 60 g 1  . oxyCODONE-acetaminophen (PERCOCET) 5-325 MG tablet Take 1 tablet by mouth every 6 (six) hours as needed for severe pain. Allow pt to pick up meds 8//23 due to going on vacation 120 tablet 0  . phenazopyridine (PYRIDIUM) 200 MG tablet Take 1 tablet (200 mg total) by mouth 4 (four) times daily as needed for pain (urinary frequency &/or pain). 8 tablet 0  . potassium chloride SA (KLOR-CON) 20 MEQ tablet Take 1 tablet (20 mEq total) by mouth daily. (Patient taking differently: Take 20 mEq by mouth every evening. ) 90 tablet 3  . pregabalin (LYRICA) 200 MG capsule Take 1 capsule (200 mg total) by mouth 2 (two) times daily. 60 capsule 3  . PROAIR HFA 108 (90 Base) MCG/ACT inhaler Inhale 1-2 puffs into the lungs every 6  (six) hours as needed. As needed 18 g 11  . rizatriptan (MAXALT) 10 MG tablet Take 1 tablet at onset of migraine. Do not take more than 2-3 a week 10 tablet 5  . Semaglutide (RYBELSUS) 7 MG TABS Take 7 mg by mouth daily.     . SUMAtriptan (IMITREX) 100 MG tablet TAKE 1 TABLET BY MOUTH IF NEEDED FOR MIGRAINE(MAY REPEAT IN 2 HOURS IF HEADACHE PERSISTS OR RECURS) 10 tablet 5  . topiramate (TOPAMAX) 25 MG tablet Take 2 tablets in AM, 3  tablets in PM 150 tablet 11  . traZODone (DESYREL) 100 MG tablet TAKE 4 TABLETS AT BEDTIME 360 tablet 3  . triamcinolone ointment (KENALOG) 0.5 % Apply 1 application topically 2 (two) times daily as needed (skin irritation/rash.). 30 g 2  . VASCEPA 1 g CAPS TAKE 2 CAPSULES TWICE A DAY WITH MEALS (Patient taking differently: Take 2 g by mouth in the morning and at bedtime. ) 360 capsule 3  . ARIPiprazole (ABILIFY) 10 MG tablet Take 1 tablet (10 mg total) by mouth once for 1 dose. 3 tablet 4  . Eszopiclone (ESZOPICLONE) 3 MG TABS Take 1 tablet (3 mg total) by mouth at bedtime as needed. Take immediately before bedtime (Patient taking differently: Take 3 mg by mouth at bedtime as needed (sleep). Take immediately before bedtime) 90 tablet 1   Current Facility-Administered Medications on File Prior to Visit  Medication Dose Route Frequency Provider Last Rate Last Admin  . bupivacaine-epinephrine (MARCAINE W/ EPI) 0.5% -1:200000 injection    Anesthesia Intra-op Effie Berkshire, MD   12 mL at 01/21/19 0935    Allergies  Allergen Reactions  . Ivp Dye [Iodinated Diagnostic Agents] Anaphylaxis and Swelling    Throat closes   . Latex     Latex tape pulls skin with it    Social History   Substance and Sexual Activity  Alcohol Use Not Currently  . Alcohol/week: 0.0 standard drinks    Social History   Tobacco Use  Smoking Status Never Smoker  Smokeless Tobacco Never Used    Review of Systems  Constitutional: Negative.   HENT: Negative.   Eyes: Negative.    Respiratory: Positive for shortness of breath.   Cardiovascular: Negative.   Gastrointestinal: Positive for abdominal pain.  Musculoskeletal: Positive for back pain and neck pain.  Skin: Negative.   Neurological: Negative.   Endo/Heme/Allergies: Bruises/bleeds easily.  Psychiatric/Behavioral: Negative.     Objective   Vitals:   01/17/20 1112  BP: (!) 96/56  Pulse: 75  Resp: 12  Temp: 97.9 F (36.6 C)  SpO2: 96%    Physical Exam Vitals reviewed.  Constitutional:      Appearance: Normal appearance. She is obese. She is not ill-appearing.     Comments: Her gait is somewhat unstable.  HENT:     Head: Normocephalic and atraumatic.  Cardiovascular:     Rate and Rhythm: Rhythm irregular.     Heart sounds: Normal heart sounds. No murmur heard.  No friction rub. No gallop.   Pulmonary:     Effort: Pulmonary effort is normal. No respiratory distress.     Breath sounds: Normal breath sounds. No stridor. No wheezing, rhonchi or rales.  Abdominal:     General: Bowel sounds are normal. There is no distension.     Palpations: Abdomen is soft. There is no mass.     Tenderness: There is no abdominal tenderness. There is no guarding or rebound.     Hernia: A hernia is present.     Comments: And indistinct right inguinal hernia is palpable, though it is difficult to fully examine secondary to body habitus.  She has no tenderness in the right groin region.  Her pannus overlies the groins.  Skin:    General: Skin is warm and dry.  Neurological:     Mental Status: She is alert and oriented to person, place, and time.   CT scan images personally reviewed Primary care notes reviewed  Assessment  Right inguinal hernia, asymptomatic Plan   No  need for surgical intervention at this time.  Patient is at high risk for surgical intervention given her multiple comorbidities and anticoagulation.  I did give her literature concerning a right inguinal hernia.  She understands and agrees.   Follow-up as needed.

## 2020-01-20 ENCOUNTER — Other Ambulatory Visit (HOSPITAL_COMMUNITY): Payer: Self-pay

## 2020-01-20 ENCOUNTER — Ambulatory Visit: Payer: Medicare Other | Admitting: Physical Medicine and Rehabilitation

## 2020-01-20 MED ORDER — PREGABALIN 200 MG PO CAPS: 200.0000 mg | ORAL_CAPSULE | Freq: Two times a day (BID) | ORAL | 0 refills | Status: DC

## 2020-01-22 ENCOUNTER — Other Ambulatory Visit: Payer: Self-pay | Admitting: Physical Medicine and Rehabilitation

## 2020-01-23 NOTE — Telephone Encounter (Signed)
Refill request for Percocet.

## 2020-02-02 DIAGNOSIS — E1142 Type 2 diabetes mellitus with diabetic polyneuropathy: Secondary | ICD-10-CM | POA: Diagnosis not present

## 2020-02-02 DIAGNOSIS — B351 Tinea unguium: Secondary | ICD-10-CM | POA: Diagnosis not present

## 2020-02-02 DIAGNOSIS — M79676 Pain in unspecified toe(s): Secondary | ICD-10-CM | POA: Diagnosis not present

## 2020-02-02 DIAGNOSIS — L84 Corns and callosities: Secondary | ICD-10-CM | POA: Diagnosis not present

## 2020-02-10 ENCOUNTER — Other Ambulatory Visit: Payer: Self-pay

## 2020-02-10 ENCOUNTER — Encounter: Payer: Self-pay | Admitting: Physical Medicine and Rehabilitation

## 2020-02-10 ENCOUNTER — Encounter
Payer: Medicare Other | Attending: Physical Medicine and Rehabilitation | Admitting: Physical Medicine and Rehabilitation

## 2020-02-10 VITALS — BP 130/84 | HR 75 | Temp 97.9°F | Ht 62.0 in | Wt 183.2 lb

## 2020-02-10 DIAGNOSIS — M797 Fibromyalgia: Secondary | ICD-10-CM | POA: Insufficient documentation

## 2020-02-10 DIAGNOSIS — Z5181 Encounter for therapeutic drug level monitoring: Secondary | ICD-10-CM | POA: Insufficient documentation

## 2020-02-10 DIAGNOSIS — Z79891 Long term (current) use of opiate analgesic: Secondary | ICD-10-CM | POA: Insufficient documentation

## 2020-02-10 DIAGNOSIS — M792 Neuralgia and neuritis, unspecified: Secondary | ICD-10-CM | POA: Diagnosis not present

## 2020-02-10 DIAGNOSIS — G894 Chronic pain syndrome: Secondary | ICD-10-CM

## 2020-02-10 DIAGNOSIS — M7918 Myalgia, other site: Secondary | ICD-10-CM

## 2020-02-10 MED ORDER — OXYCODONE-ACETAMINOPHEN 5-325 MG PO TABS: 1.0000 | ORAL_TABLET | Freq: Four times a day (QID) | ORAL | 0 refills | Status: DC | PRN

## 2020-02-10 NOTE — Patient Instructions (Signed)
Plan: 1. Will send in Percocet refill a little early- and can pick up at normal time.  5/325 mg QID prn for pain.    2.  Patient here for trigger point injections for  Consent done and on chart.  Cleaned areas with alcohol and injected using a 27 gauge 1.5 inch needle  Injected 6cc Using 1% Lidocaine with no EPI  Upper traps B/L Levators B/L Posterior scalenes B/L Middle scalenes Splenius Capitus Pectoralis Major Rhomboids B/L Infraspinatus Teres Major/minor Thoracic paraspinals Lumbar paraspinals B/l x2  Other injections-    Patient's level of pain prior was 8/10 Current level of pain after injections is 5/10  There was no bleeding or complications.  Patient was advised to drink a lot of water on day after injections to flush system Will have increased soreness for 12-48 hours after injections.  Can use Lidocaine patches the day AFTER injections Can use theracane on day of injections in places didn't inject Can use heating pad 4-6 hours AFTER injections   3. Theracane or Accucurve massager- holding direct pressure for 2-4 minutes on each trigger point/tight muscle- youtube has some great videos. Some people like the bigger knobs on the accucurve vs the theracane.  Don't get Back buddy  4. Lidocaine patches can sometimes help- over the counter- 12 hrs on; 12 hrs off- over the areas that hurt the most.   5. F/U in 6 weeks-

## 2020-02-10 NOTE — Progress Notes (Signed)
CAD,pulmonary HTN-and DM- with CKD- Stage III here for right low back pain with sciaticaAs well as upper back/neck pain/myofascial pain here forf/u.    Went on vacation.  Went OK for pt- she doesn't like beach as much as she used to.   Was wondering if there was anything other than trigger point injections to help treat pain.    Due to percocet refills.  Not too much constipation on current regimen. None per pt.        Plan: 1. Will send in Percocet refill a little early- and can pick up at normal time.  5/325 mg QID prn for pain.    2.  Patient here for trigger point injections for  Consent done and on chart.  Cleaned areas with alcohol and injected using a 27 gauge 1.5 inch needle  Injected 6cc Using 1% Lidocaine with no EPI  Upper traps B/L Levators B/L Posterior scalenes B/L Middle scalenes Splenius Capitus Pectoralis Major Rhomboids B/L Infraspinatus Teres Major/minor Thoracic paraspinals Lumbar paraspinals B/l x2  Other injections-    Patient's level of pain prior was 8/10 Current level of pain after injections is 5/10  There was no bleeding or complications.  Patient was advised to drink a lot of water on day after injections to flush system Will have increased soreness for 12-48 hours after injections.  Can use Lidocaine patches the day AFTER injections Can use theracane on day of injections in places didn't inject Can use heating pad 4-6 hours AFTER injections   3. Theracane or Accucurve massager- holding direct pressure for 2-4 minutes on each trigger point/tight muscle- youtube has some great videos. Some people like the bigger knobs on the accucurve vs the theracane.  Don't get Back buddy  4. Lidocaine patches can sometimes help- over the counter- 12 hrs on; 12 hrs off- over the areas that hurt the most.   5. F/U in 6 weeks-    I spent a total of 30 minutes on visit- as detailed above.

## 2020-02-13 DIAGNOSIS — I129 Hypertensive chronic kidney disease with stage 1 through stage 4 chronic kidney disease, or unspecified chronic kidney disease: Secondary | ICD-10-CM | POA: Diagnosis not present

## 2020-02-13 DIAGNOSIS — E211 Secondary hyperparathyroidism, not elsewhere classified: Secondary | ICD-10-CM | POA: Diagnosis not present

## 2020-02-13 DIAGNOSIS — N189 Chronic kidney disease, unspecified: Secondary | ICD-10-CM | POA: Diagnosis not present

## 2020-02-13 DIAGNOSIS — Z79899 Other long term (current) drug therapy: Secondary | ICD-10-CM | POA: Diagnosis not present

## 2020-02-13 DIAGNOSIS — E1129 Type 2 diabetes mellitus with other diabetic kidney complication: Secondary | ICD-10-CM | POA: Diagnosis not present

## 2020-02-13 DIAGNOSIS — E1122 Type 2 diabetes mellitus with diabetic chronic kidney disease: Secondary | ICD-10-CM | POA: Diagnosis not present

## 2020-02-13 DIAGNOSIS — R809 Proteinuria, unspecified: Secondary | ICD-10-CM | POA: Diagnosis not present

## 2020-02-13 DIAGNOSIS — E870 Hyperosmolality and hypernatremia: Secondary | ICD-10-CM | POA: Diagnosis not present

## 2020-02-13 DIAGNOSIS — E559 Vitamin D deficiency, unspecified: Secondary | ICD-10-CM | POA: Diagnosis not present

## 2020-02-15 ENCOUNTER — Other Ambulatory Visit: Payer: Self-pay

## 2020-02-15 ENCOUNTER — Ambulatory Visit (HOSPITAL_COMMUNITY): Payer: Medicare Other | Admitting: Psychiatry

## 2020-02-16 ENCOUNTER — Other Ambulatory Visit (HOSPITAL_COMMUNITY): Payer: Self-pay | Admitting: *Deleted

## 2020-02-16 DIAGNOSIS — F3130 Bipolar disorder, current episode depressed, mild or moderate severity, unspecified: Secondary | ICD-10-CM

## 2020-02-16 MED ORDER — PREGABALIN 200 MG PO CAPS: 200.0000 mg | ORAL_CAPSULE | Freq: Three times a day (TID) | ORAL | 2 refills | Status: DC

## 2020-02-16 MED ORDER — ESCITALOPRAM OXALATE 10 MG PO TABS: 10.0000 mg | ORAL_TABLET | Freq: Every day | ORAL | 2 refills | Status: DC

## 2020-02-16 MED ORDER — PREGABALIN 200 MG PO CAPS
200.0000 mg | ORAL_CAPSULE | Freq: Two times a day (BID) | ORAL | 0 refills | Status: DC
Start: 2020-02-16 — End: 2020-02-16

## 2020-02-16 MED ORDER — TRAZODONE HCL 100 MG PO TABS
400.0000 mg | ORAL_TABLET | Freq: Every day | ORAL | 3 refills | Status: DC
Start: 2020-02-16 — End: 2020-02-22

## 2020-02-17 DIAGNOSIS — I129 Hypertensive chronic kidney disease with stage 1 through stage 4 chronic kidney disease, or unspecified chronic kidney disease: Secondary | ICD-10-CM | POA: Diagnosis not present

## 2020-02-17 DIAGNOSIS — N189 Chronic kidney disease, unspecified: Secondary | ICD-10-CM | POA: Diagnosis not present

## 2020-02-17 DIAGNOSIS — E870 Hyperosmolality and hypernatremia: Secondary | ICD-10-CM | POA: Diagnosis not present

## 2020-02-17 DIAGNOSIS — R809 Proteinuria, unspecified: Secondary | ICD-10-CM | POA: Diagnosis not present

## 2020-02-17 DIAGNOSIS — E1122 Type 2 diabetes mellitus with diabetic chronic kidney disease: Secondary | ICD-10-CM | POA: Diagnosis not present

## 2020-02-17 DIAGNOSIS — E1129 Type 2 diabetes mellitus with other diabetic kidney complication: Secondary | ICD-10-CM | POA: Diagnosis not present

## 2020-02-22 ENCOUNTER — Other Ambulatory Visit (HOSPITAL_COMMUNITY): Payer: Self-pay | Admitting: *Deleted

## 2020-02-22 ENCOUNTER — Other Ambulatory Visit: Payer: Self-pay

## 2020-02-22 ENCOUNTER — Ambulatory Visit (INDEPENDENT_AMBULATORY_CARE_PROVIDER_SITE_OTHER): Payer: Medicare Other | Admitting: Psychiatry

## 2020-02-22 DIAGNOSIS — F3130 Bipolar disorder, current episode depressed, mild or moderate severity, unspecified: Secondary | ICD-10-CM

## 2020-02-22 DIAGNOSIS — R6889 Other general symptoms and signs: Secondary | ICD-10-CM

## 2020-02-22 MED ORDER — FLURAZEPAM HCL 30 MG PO CAPS: 30.0000 mg | ORAL_CAPSULE | Freq: Every evening | ORAL | 3 refills | Status: DC | PRN

## 2020-02-22 MED ORDER — LORAZEPAM 1 MG PO TABS: 1.0000 mg | ORAL_TABLET | ORAL | 4 refills | Status: DC

## 2020-02-22 MED ORDER — PREGABALIN 200 MG PO CAPS: 200.0000 mg | ORAL_CAPSULE | Freq: Three times a day (TID) | ORAL | 2 refills | Status: DC

## 2020-02-22 MED ORDER — CARBAMAZEPINE ER 100 MG PO CP12: 100.0000 mg | ORAL_CAPSULE | Freq: Every day | ORAL | 1 refills | Status: DC

## 2020-02-22 MED ORDER — ARIPIPRAZOLE 10 MG PO TABS: 10.0000 mg | ORAL_TABLET | Freq: Once | ORAL | 4 refills | Status: DC

## 2020-02-22 NOTE — Progress Notes (Signed)
Patient ID: MAJEL GIEL, female   DOB: August 08, 1945, 74 y.o.   MRN: [id number] [facility] MD Progress Note .she's going to apply 02/22/2020 1:39 PM CHENEL WERNLI  MRN:  [id number] Subjective:  Doing well Principal Problem: Bipolar disorder manic type Diagnosis: Bipolar disorder  Today patient is doing better.  Her mood is better.  Physically she is.  Her grandson and his issues do not seem to be as much of a problem.  The patient's kitchen is finished.  Overall patient is doing better in terms of her mood she is sleeping fairly well but requires 600 mg of trazodone.  She is oversedated during the day.  I have asked her to discontinue taking trazodone.  Presently the patient is not in any therapy.  At this time the patient admits to me that she is actually experiencing delusional beliefs that other family members are living in her house.  She says she just sentences.  She hears a baby crying.  She does not hear voices or seeing any visions.  This is been going on for 5 years and to be clear and is not distressing to her.  She brings in with her husband and her husband has a hard time dealing with it.  The patient shows no signs of mania.  Sinuses delusional information that is not distressing to her she has no other evidence of psychosis.  The patient never started her Lexapro 10 mg.  Patient is not suicidal.  She seen with her husband.  She is functioning reasonably well.  Patient is very distressed with our clinic here because she could not get through.  She was so distressed she was going to and care.. Past Medical History:  Diagnosis Date  . Atrial fibrillation with RVR 05/19/2017  . Bipolar I disorder 01/23/2015  . CAD (coronary artery disease)    Nonobstructive; Managed by Dr. Bronson Ing  . Cardiomegaly 01/12/2018  . Chronic anticoagulation 05/30/2015  . Chronic back pain    Lower back  . Chronic diastolic CHF (congestive heart failure) 05/30/2015  . Diabetes mellitus, type 2, without  complication   . Diabetic neuropathy 02/06/2016  . Dysrhythmia    A-Fib  . Essential hypertension   . Herpes genitalis in women 07/16/2015  . Hyperlipidemia   . Hypertension   . Hypoxia 02/01/2019  . Insomnia 01/23/2015  . Lipoma 02/08/2015  . Major depressive disorder   . Migraine headache with aura 02/12/2016  . 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[place]) 04/21/2019  . NAFL (nonalcoholic fatty liver) 4/00/8676  . OSA (obstructive sleep apnea) 02/24/2019   10/09/2018 - HST  - AHI 40.6   . Pulmonary hypertension   . Renal insufficiency    Managed by Dr. Wallace Keller  . RLS (restless legs syndrome) 04/27/2015    Past Surgical History:  Procedure Laterality Date  . BREAST REDUCTION SURGERY    . EYE SURGERY Right    cateracts  . HAMMER TOE SURGERY    . LEFT HEART CATH AND CORONARY ANGIOGRAPHY N/A 02/03/2018   Procedure: LEFT HEART CATH AND CORONARY ANGIOGRAPHY;  Surgeon: Martinique, Peter M, MD;  Location: [place] [facility];  Service: Cardiovascular;  Laterality: N/A;  . REVERSE SHOULDER ARTHROPLASTY Right 08/19/2019   Procedure: REVERSE SHOULDER ARTHROPLASTY;  Surgeon: Netta Cedars, MD;  Location: [facility];  Service: Orthopedics;  Laterality: Right;  interscalene block  . SHOULDER SURGERY Right    "I BROKE MY SHOUDLER  . THIGH SURGERY     "TO REMOVE A TUMOR "  Family History:  Family History  Problem Relation Age of Onset  . Diabetes Mother   . Heart disease Mother   . Heart disease Brother   . Heart disease Sister        CABG  . Diabetes Sister   . Alzheimer's disease Father   . Alcohol abuse Sister   . Mental illness Brother   . Diabetes Brother    Family Psychiatric  History:  Social History:  Social History   Substance and Sexual Activity  Alcohol Use Not Currently  . Alcohol/week: 0.0 standard drinks     Social History   Substance and Sexual Activity  Drug Use No    Social History   Socioeconomic History  . Marital status: Married    Spouse name: Orpah Greek  . Number of  children: 3  . Years of education: 74  . Highest education level: Bachelor's degree (e.g., BA, AB, BS)  Occupational History  . Occupation: Retired    Comment: marketing  Tobacco Use  . Smoking status: Never Smoker  . Smokeless tobacco: Never Used  Vaping Use  . Vaping Use: Never used  Substance and Sexual Activity  . Alcohol use: Not Currently    Alcohol/week: 0.0 standard drinks  . Drug use: No  . Sexual activity: Yes    Partners: Male    Birth control/protection: Post-menopausal  Other Topics Concern  . Not on file  Social History Narrative   Lives at home with husband.       Right handed   Social Determinants of Health   Financial Resource Strain:   . Difficulty of Paying Living Expenses: Not on file  Food Insecurity:   . Worried About Charity fundraiser in the Last Year: Not on file  . Ran Out of Food in the Last Year: Not on file  Transportation Needs:   . Lack of Transportation (Medical): Not on file  . Lack of Transportation (Non-Medical): Not on file  Physical Activity:   . Days of Exercise per Week: Not on file  . Minutes of Exercise per Session: Not on file  Stress:   . Feeling of Stress : Not on file  Social Connections:   . Frequency of Communication with Friends and Family: Not on file  . Frequency of Social Gatherings with Friends and Family: Not on file  . Attends Religious Services: Not on file  . Active Member of Clubs or Organizations: Not on file  . Attends Archivist Meetings: Not on file  . Marital Status: Not on file   Additional Social History:                         Sleep: Good  Appetite:  Good  Current Medications: Current Outpatient Medications  Medication Sig Dispense Refill  . acetaminophen (TYLENOL) 325 MG tablet Take 325-650 mg by mouth every 6 (six) hours as needed for mild pain, moderate pain or headache.     . albuterol (PROVENTIL) (2.5 MG/3ML) 0.083% nebulizer solution Take 3 mLs (2.5 mg total) by  nebulization every 6 (six) hours as needed for wheezing or shortness of breath. 150 mL 1  . ARIPiprazole (ABILIFY) 10 MG tablet Take 1 tablet (10 mg total) by mouth once for 1 dose. 3 tablet 4  . atorvastatin (LIPITOR) 40 MG tablet Take 1 tablet (40 mg total) by mouth daily. 90 tablet 3  . budesonide-formoterol (SYMBICORT) 80-4.5 MCG/ACT inhaler Inhale 2 puffs into the lungs 2 (  two) times daily. 2 Inhaler 5  . carbamazepine (CARBATROL) 100 MG 12 hr capsule Take 1 capsule (100 mg total) by mouth daily. 90 capsule 1  . Cholecalciferol (VITAMIN D3) 1000 UNITS CAPS Take 1,000 Units by mouth daily.    Marland Kitchen diltiazem (CARDIZEM CD) 120 MG 24 hr capsule TAKE 1 CAPSULE AT BEDTIME 90 capsule 3  . ELIQUIS 5 MG TABS tablet TAKE 1 TABLET TWICE A DAY (Patient taking differently: Take 5 mg by mouth 2 (two) times daily. ) 180 tablet 3  . escitalopram (LEXAPRO) 10 MG tablet Take 1 tablet (10 mg total) by mouth daily. 30 tablet 2  . fluconazole (DIFLUCAN) 150 MG tablet TAKE 1 TABLET(150 MG) BY MOUTH 1 TIME FOR 1 DOSE 1 tablet 1  . flurazepam (DALMANE) 30 MG capsule Take 1 capsule (30 mg total) by mouth at bedtime as needed for sleep. 30 capsule 3  . furosemide (LASIX) 40 MG tablet TAKE 1 TABLET EVERY MORNING  (DOSE INCREASED) 90 tablet 3  . ibuprofen (ADVIL) 800 MG tablet Take 1 tablet (800 mg total) by mouth every 8 (eight) hours as needed. 30 tablet 5  . Insulin Glargine (LANTUS SOLOSTAR) 100 UNIT/ML Solostar Pen INJECT 60 UNITS UNDER THE SKIN TWICE A DAY (Patient taking differently: Inject 60 Units into the skin 2 (two) times daily. ) 90 mL 0  . LORazepam (ATIVAN) 1 MG tablet Take 1-2 tablets (1-2 mg total) by mouth See admin instructions. Take 1 tab (1 mg) in the morning and 2 tabs (2 mg ) at bedtime 90 tablet 4  . metaxalone (SKELAXIN) 800 MG tablet Take 1 tablet (800 mg total) by mouth 3 (three) times daily as needed for muscle spasms. 3 months supply 270 tablet 3  . metoprolol tartrate (LOPRESSOR) 100 MG tablet  TAKE 1 TABLET TWICE A DAY WITH METOPROLOL 25 MG TO EQUAL 125 MG 180 tablet 3  . metoprolol tartrate (LOPRESSOR) 25 MG tablet TAKE 1 TABLET TWICE A DAY (TO BE TAKEN ALONG WITH THE LOPRESSOR 100 MG TWICE A DAY) 180 tablet 1  . metroNIDAZOLE (FLAGYL) 500 MG tablet Take 1 tablet (500 mg total) by mouth 3 (three) times daily. 21 tablet 0  . naloxone (NARCAN) nasal spray 4 mg/0.1 mL Place 1 spray into the nose once as needed (opiod overdose).    Marland Kitchen nystatin (MYCOSTATIN/NYSTOP) powder Apply topically 3 (three) times daily. x2 weeks (for groin/breast rash) 60 g 1  . oxyCODONE-acetaminophen (PERCOCET/ROXICET) 5-325 MG tablet Take 1 tablet by mouth every 6 (six) hours as needed. for pain 120 tablet 0  . phenazopyridine (PYRIDIUM) 200 MG tablet Take 1 tablet (200 mg total) by mouth 4 (four) times daily as needed for pain (urinary frequency &/or pain). 8 tablet 0  . potassium chloride SA (KLOR-CON) 20 MEQ tablet Take 1 tablet (20 mEq total) by mouth daily. (Patient taking differently: Take 20 mEq by mouth every evening. ) 90 tablet 3  . pregabalin (LYRICA) 200 MG capsule Take 1 capsule (200 mg total) by mouth 3 (three) times daily. 90 capsule 2  . PROAIR HFA 108 (90 Base) MCG/ACT inhaler Inhale 1-2 puffs into the lungs every 6 (six) hours as needed. As needed 18 g 11  . rizatriptan (MAXALT) 10 MG tablet Take 1 tablet at onset of migraine. Do not take more than 2-3 a week 10 tablet 5  . Semaglutide (RYBELSUS) 7 MG TABS Take 7 mg by mouth daily.     . SUMAtriptan (IMITREX) 100 MG tablet TAKE 1  TABLET BY MOUTH IF NEEDED FOR MIGRAINE(MAY REPEAT IN 2 HOURS IF HEADACHE PERSISTS OR RECURS) 10 tablet 5  . topiramate (TOPAMAX) 25 MG tablet Take 2 tablets in AM, 3 tablets in PM 150 tablet 11  . triamcinolone ointment (KENALOG) 0.5 % Apply 1 application topically 2 (two) times daily as needed (skin irritation/rash.). 30 g 2  . VASCEPA 1 g CAPS TAKE 2 CAPSULES TWICE A DAY WITH MEALS (Patient taking differently: Take 2 g by  mouth in the morning and at bedtime. ) 360 capsule 3   No current facility-administered medications for this visit.   Facility-Administered Medications Ordered in Other Visits  Medication Dose Route Frequency Provider Last Rate Last Admin  . bupivacaine-epinephrine (MARCAINE W/ EPI) 0.5% -1:200000 injection    Anesthesia Intra-op Effie Berkshire, MD   12 mL at 01/21/19 0935    Lab Results: No results found for this or any previous visit (from the past 48 hour(s)).  Blood Alcohol level:  Lab Results  Component Value Date   ETH <5 10/01/2015    Physical Findings: AIMS:  , ,  ,  ,    CIWA:    COWS:     Musculoskeletal: Strength & Muscle Tone: within normal limits Gait & Station: normal Patient leans: N/A  Psychiatric Specialty Exam: ROS Patient ID: PSALM ARMAN, female   DOB: 03/13/1946, 74 y.o.   MRN: [id number] [facility] MD Progress Note .she's going to apply 02/22/2020 1:39 PM SAMONA CHIHUAHUA  MRN:  [id number] Subjective:  Doing well Principal Problem: Bipolar disorder most recent episode manic Diagnosis:  Bipolar disorder most recent     Assessment/plan   At this time we will go ahead and add Lexapro 10 mg.  She started in the morning.  It is noted that she is on Abilify 30 mg and Tegretol 100 mg.  She also takes Lyrica 150 mg twice daily.  Today we will reduce her Ativan to taking 1 mg morning and 1 at night.  She was taking 2 mg.  Patient seems very tired and fatigued.  Patient is not suicidal.  Her major problem is that of bipolar disorder depressed type.  Her second problem is that of insomnia and takes a small dose of trazodone.  My records over the next few months will be to taper off her Ativan as best we can.  Apparently the combination of Tegretol, Abilify and Lyrica has kept her mood very stable over the last year.  Her next visit we will go ahead and get some blood work.  She will return to see me in 4 to 5 weeks.  Her husband has agreed to contact us if anything  changes.  The patient is not suicidal and I think a lot of things are about her environment.  This has to do with the stress of her grandson being in trouble, construction in her home, the pandemic and the fact that is so hot outside.  The patient was able to walk out of her office without problem.  Her blood pressure was taking and it was generally in normal limits.  She never complained of shortness of breath or chest pain but has no overt neurological symptoms.  She appears sad to observe facial expressions.    Past Medical History:  Diagnosis Date  . Atrial fibrillation with RVR 05/19/2017  . Bipolar I disorder 01/23/2015  . CAD (coronary artery disease)    Nonobstructive; Managed by Dr. Bronson Ing  . Cardiomegaly 01/12/2018  . Chronic  anticoagulation 05/30/2015  . Chronic back pain    Lower back  . Chronic diastolic CHF (congestive heart failure) 05/30/2015  . Diabetes mellitus, type 2, without complication   . Diabetic neuropathy 02/06/2016  . Dysrhythmia    A-Fib  . Essential hypertension   . Herpes genitalis in women 07/16/2015  . Hyperlipidemia   . Hypertension   . Hypoxia 02/01/2019  . Insomnia 01/23/2015  . Lipoma 02/08/2015  . Major depressive disorder   . Migraine headache with aura 02/12/2016  . 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[place]) 04/21/2019  . NAFL (nonalcoholic fatty liver) 6/96/2952  . OSA (obstructive sleep apnea) 02/24/2019   10/09/2018 - HST  - AHI 40.6   . Pulmonary hypertension   . Renal insufficiency    Managed by Dr. Wallace Keller  . RLS (restless legs syndrome) 04/27/2015    Past Surgical History:  Procedure Laterality Date  . BREAST REDUCTION SURGERY    . EYE SURGERY Right    cateracts  . HAMMER TOE SURGERY    . LEFT HEART CATH AND CORONARY ANGIOGRAPHY N/A 02/03/2018   Procedure: LEFT HEART CATH AND CORONARY ANGIOGRAPHY;  Surgeon: Martinique, Peter M, MD;  Location: [place] [facility];  Service: Cardiovascular;  Laterality: N/A;  . REVERSE SHOULDER ARTHROPLASTY  Right 08/19/2019   Procedure: REVERSE SHOULDER ARTHROPLASTY;  Surgeon: Netta Cedars, MD;  Location: [facility];  Service: Orthopedics;  Laterality: Right;  interscalene block  . SHOULDER SURGERY Right    "I BROKE MY SHOUDLER  . THIGH SURGERY     "TO REMOVE A TUMOR "   Family History:  Family History  Problem Relation Age of Onset  . Diabetes Mother   . Heart disease Mother   . Heart disease Brother   . Heart disease Sister        CABG  . Diabetes Sister   . Alzheimer's disease Father   . Alcohol abuse Sister   . Mental illness Brother   . Diabetes Brother    Family Psychiatric  History:  Social History:  Social History   Substance and Sexual Activity  Alcohol Use Not Currently  . Alcohol/week: 0.0 standard drinks     Social History   Substance and Sexual Activity  Drug Use No    Social History   Socioeconomic History  . Marital status: Married    Spouse name: Orpah Greek  . Number of children: 3  . Years of education: 48  . Highest education level: Bachelor's degree (e.g., BA, AB, BS)  Occupational History  . Occupation: Retired    Comment: marketing  Tobacco Use  . Smoking status: Never Smoker  . Smokeless tobacco: Never Used  Vaping Use  . Vaping Use: Never used  Substance and Sexual Activity  . Alcohol use: Not Currently    Alcohol/week: 0.0 standard drinks  . Drug use: No  . Sexual activity: Yes    Partners: Male    Birth control/protection: Post-menopausal  Other Topics Concern  . Not on file  Social History Narrative   Lives at home with husband.       Right handed   Social Determinants of Health   Financial Resource Strain:   . Difficulty of Paying Living Expenses: Not on file  Food Insecurity:   . Worried About Charity fundraiser in the Last Year: Not on file  . Ran Out of Food in the Last Year: Not on file  Transportation Needs:   . Lack of Transportation (Medical): Not on file  .  Lack of Transportation (Non-Medical): Not on file  Physical  Activity:   . Days of Exercise per Week: Not on file  . Minutes of Exercise per Session: Not on file  Stress:   . Feeling of Stress : Not on file  Social Connections:   . Frequency of Communication with Friends and Family: Not on file  . Frequency of Social Gatherings with Friends and Family: Not on file  . Attends Religious Services: Not on file  . Active Member of Clubs or Organizations: Not on file  . Attends Archivist Meetings: Not on file  . Marital Status: Not on file   Additional Social History:                         Sleep: Good  Appetite:  Good  Current Medications: Current Outpatient Medications  Medication Sig Dispense Refill  . acetaminophen (TYLENOL) 325 MG tablet Take 325-650 mg by mouth every 6 (six) hours as needed for mild pain, moderate pain or headache.     . albuterol (PROVENTIL) (2.5 MG/3ML) 0.083% nebulizer solution Take 3 mLs (2.5 mg total) by nebulization every 6 (six) hours as needed for wheezing or shortness of breath. 150 mL 1  . ARIPiprazole (ABILIFY) 10 MG tablet Take 1 tablet (10 mg total) by mouth once for 1 dose. 3 tablet 4  . atorvastatin (LIPITOR) 40 MG tablet Take 1 tablet (40 mg total) by mouth daily. 90 tablet 3  . budesonide-formoterol (SYMBICORT) 80-4.5 MCG/ACT inhaler Inhale 2 puffs into the lungs 2 (two) times daily. 2 Inhaler 5  . carbamazepine (CARBATROL) 100 MG 12 hr capsule Take 1 capsule (100 mg total) by mouth daily. 90 capsule 1  . Cholecalciferol (VITAMIN D3) 1000 UNITS CAPS Take 1,000 Units by mouth daily.    Marland Kitchen diltiazem (CARDIZEM CD) 120 MG 24 hr capsule TAKE 1 CAPSULE AT BEDTIME 90 capsule 3  . ELIQUIS 5 MG TABS tablet TAKE 1 TABLET TWICE A DAY (Patient taking differently: Take 5 mg by mouth 2 (two) times daily. ) 180 tablet 3  . escitalopram (LEXAPRO) 10 MG tablet Take 1 tablet (10 mg total) by mouth daily. 30 tablet 2  . fluconazole (DIFLUCAN) 150 MG tablet TAKE 1 TABLET(150 MG) BY MOUTH 1 TIME FOR 1 DOSE 1  tablet 1  . flurazepam (DALMANE) 30 MG capsule Take 1 capsule (30 mg total) by mouth at bedtime as needed for sleep. 30 capsule 3  . furosemide (LASIX) 40 MG tablet TAKE 1 TABLET EVERY MORNING  (DOSE INCREASED) 90 tablet 3  . ibuprofen (ADVIL) 800 MG tablet Take 1 tablet (800 mg total) by mouth every 8 (eight) hours as needed. 30 tablet 5  . Insulin Glargine (LANTUS SOLOSTAR) 100 UNIT/ML Solostar Pen INJECT 60 UNITS UNDER THE SKIN TWICE A DAY (Patient taking differently: Inject 60 Units into the skin 2 (two) times daily. ) 90 mL 0  . LORazepam (ATIVAN) 1 MG tablet Take 1-2 tablets (1-2 mg total) by mouth See admin instructions. Take 1 tab (1 mg) in the morning and 2 tabs (2 mg ) at bedtime 90 tablet 4  . metaxalone (SKELAXIN) 800 MG tablet Take 1 tablet (800 mg total) by mouth 3 (three) times daily as needed for muscle spasms. 3 months supply 270 tablet 3  . metoprolol tartrate (LOPRESSOR) 100 MG tablet TAKE 1 TABLET TWICE A DAY WITH METOPROLOL 25 MG TO EQUAL 125 MG 180 tablet 3  . metoprolol tartrate (LOPRESSOR)  25 MG tablet TAKE 1 TABLET TWICE A DAY (TO BE TAKEN ALONG WITH THE LOPRESSOR 100 MG TWICE A DAY) 180 tablet 1  . metroNIDAZOLE (FLAGYL) 500 MG tablet Take 1 tablet (500 mg total) by mouth 3 (three) times daily. 21 tablet 0  . naloxone (NARCAN) nasal spray 4 mg/0.1 mL Place 1 spray into the nose once as needed (opiod overdose).    Marland Kitchen nystatin (MYCOSTATIN/NYSTOP) powder Apply topically 3 (three) times daily. x2 weeks (for groin/breast rash) 60 g 1  . oxyCODONE-acetaminophen (PERCOCET/ROXICET) 5-325 MG tablet Take 1 tablet by mouth every 6 (six) hours as needed. for pain 120 tablet 0  . phenazopyridine (PYRIDIUM) 200 MG tablet Take 1 tablet (200 mg total) by mouth 4 (four) times daily as needed for pain (urinary frequency &/or pain). 8 tablet 0  . potassium chloride SA (KLOR-CON) 20 MEQ tablet Take 1 tablet (20 mEq total) by mouth daily. (Patient taking differently: Take 20 mEq by mouth every  evening. ) 90 tablet 3  . pregabalin (LYRICA) 200 MG capsule Take 1 capsule (200 mg total) by mouth 3 (three) times daily. 90 capsule 2  . PROAIR HFA 108 (90 Base) MCG/ACT inhaler Inhale 1-2 puffs into the lungs every 6 (six) hours as needed. As needed 18 g 11  . rizatriptan (MAXALT) 10 MG tablet Take 1 tablet at onset of migraine. Do not take more than 2-3 a week 10 tablet 5  . Semaglutide (RYBELSUS) 7 MG TABS Take 7 mg by mouth daily.     . SUMAtriptan (IMITREX) 100 MG tablet TAKE 1 TABLET BY MOUTH IF NEEDED FOR MIGRAINE(MAY REPEAT IN 2 HOURS IF HEADACHE PERSISTS OR RECURS) 10 tablet 5  . topiramate (TOPAMAX) 25 MG tablet Take 2 tablets in AM, 3 tablets in PM 150 tablet 11  . triamcinolone ointment (KENALOG) 0.5 % Apply 1 application topically 2 (two) times daily as needed (skin irritation/rash.). 30 g 2  . VASCEPA 1 g CAPS TAKE 2 CAPSULES TWICE A DAY WITH MEALS (Patient taking differently: Take 2 g by mouth in the morning and at bedtime. ) 360 capsule 3   No current facility-administered medications for this visit.   Facility-Administered Medications Ordered in Other Visits  Medication Dose Route Frequency Provider Last Rate Last Admin  . bupivacaine-epinephrine (MARCAINE W/ EPI) 0.5% -1:200000 injection    Anesthesia Intra-op Effie Berkshire, MD   12 mL at 01/21/19 0935    Lab Results: No results found for this or any previous visit (from the past 48 hour(s)).  Blood Alcohol level:  Lab Results  Component Value Date   ETH <5 10/01/2015    Physical Findings: AIMS:  , ,  ,  ,    CIWA:    COWS:     Musculoskeletal: Strength & Muscle Tone: within normal limits Gait & Station: normal Patient leans: N/A  Psychiatric Specialty Exam: ROS  There were no vitals taken for this visit.There is no height or weight on file to calculate BMI.  General Appearance: Casual  Eye Contact::  Good  Speech:  Clear and Coherent  Volume:  Normal  Mood:  Euthymic  Affect:  Appropriate  Thought  Process:  Coherent  Orientation:  Full (Time, Place, and Person)  Thought Content:  WDL  Suicidal Thoughts:  No  Homicidal Thoughts:  No  Memory:  NA  Judgement:  Good  Insight:  Good  Psychomotor Activity:  Normal  Concentration:  Good  Recall:  Good  Fund of Knowledge:Good  Language: Good  Akathisia:  No  Handed:  Right  AIMS (if indicated):     Assets: Good spirits   ADL's:  Intact  Cognition: WNL  Sleep:     eks Treatment Plan Summary:  Today the patient's problem is that of schizoaffective disorder bipolar type.  At this time she will continue taking large dose of Abilify 30 mg.  We will not address her delusional material at this time.  We will discuss it further.  Her husband claims that she thinks it is related to her being sedated or just waking up.  On the other hand the patient clearly says that these delusional thoughts occur even when he is awake and alert on a regular basis.  At this time we will begin her on Lexapro 10 mg which she is supposed to start before.  We will make efforts to get blood work including a Tegretol level and a comprehensive metabolic panel.  The patient also takes Lyrica 150 mg twice daily.  Her second problem is that of insomnia.  Today we will ask her to discontinue the trazodone and will begin her on Dalmane 30 mg.  The patient drinks no alcohol use no drugs.  She is not suicidal.  Our plan is for her to transition to [facility].  She will be seen in that setting in 1 to 2 months.  There were no vitals taken for this visit.There is no height or weight on file to calculate BMI.  General Appearance: Casual  Eye Contact::  Good  Speech:  Clear and Coherent  Volume:  Normal  Mood:  Euthymic  Affect:  Appropriate  Thought Process:  Coherent  Orientation:  Full (Time, Place, and Person)  Thought Content:  WDL  Suicidal Thoughts:  No  Homicidal Thoughts:  No  Memory:  NA  Judgement:  Good  Insight:  Good  Psychomotor Activity:  Normal   Concentration:  Good  Recall:  Good  Fund of Knowledge:Good  Language: Good  Akathisia:  No  Handed:  Right  AIMS (if indicated):     Assets: Good spirits   ADL's:  Intact  Cognition: WNL  Sleep:     eks Treatment Plan Summary:  Today the patient is doing well.  It is noted that she is not had any blood work in a while.  We will go ahead and make arranges for her to get it at [facility].  The patient will continue taking Tegretol 100 mg once a day.  This is for her first problem of bipolar disorder.  At this time she is doing great.  Should continue taking her Abilify as prescribed as well.  Her second problem is that of insomnia.  Should continue taking trazodone which we will increase to 100 mg for today.  She no longer takes doxepin.  Patient will continue taking Ativan 1 mg 1 in the morning and 2 at night.  I suspect overall this helps both her bipolar state as well as her insomnia.  Patient will also continue taking Lunesta 3 mg as prescribed.  This patient will be seen again in 3 months.  I believe she is very stable.  She is functioning well.  The blood work the patient needs is a morning Tegretol level, comprehensive metabolic panel and a CBC.

## 2020-02-29 ENCOUNTER — Ambulatory Visit (INDEPENDENT_AMBULATORY_CARE_PROVIDER_SITE_OTHER): Payer: Medicare Other | Admitting: Student

## 2020-02-29 ENCOUNTER — Encounter: Payer: Self-pay | Admitting: Student

## 2020-02-29 ENCOUNTER — Other Ambulatory Visit: Payer: Self-pay

## 2020-02-29 VITALS — BP 118/62 | HR 64 | Ht 62.0 in | Wt 184.0 lb

## 2020-02-29 DIAGNOSIS — I2510 Atherosclerotic heart disease of native coronary artery without angina pectoris: Secondary | ICD-10-CM

## 2020-02-29 DIAGNOSIS — J449 Chronic obstructive pulmonary disease, unspecified: Secondary | ICD-10-CM | POA: Diagnosis not present

## 2020-02-29 DIAGNOSIS — R42 Dizziness and giddiness: Secondary | ICD-10-CM | POA: Diagnosis not present

## 2020-02-29 DIAGNOSIS — I5032 Chronic diastolic (congestive) heart failure: Secondary | ICD-10-CM | POA: Diagnosis not present

## 2020-02-29 DIAGNOSIS — I10 Essential (primary) hypertension: Secondary | ICD-10-CM

## 2020-02-29 DIAGNOSIS — I4821 Permanent atrial fibrillation: Secondary | ICD-10-CM

## 2020-02-29 DIAGNOSIS — Z23 Encounter for immunization: Secondary | ICD-10-CM

## 2020-02-29 MED ORDER — MECLIZINE HCL 12.5 MG PO TABS: 12.5000 mg | ORAL_TABLET | Freq: Three times a day (TID) | ORAL | 3 refills | Status: DC | PRN

## 2020-02-29 NOTE — Patient Instructions (Addendum)
Medication Instructions:   Continue to monitor Heart rate at home. Normal heart rate can range between 50 beats per minute to 100 beats per minute.   TAKE MECLIZINE 12.5 MG 3 TIMES DAILY AS NEEDED     Labwork:  None  Testing/Procedures:   Follow-Up:  6 MONTHS   Any Other Special Instructions Will Be Listed Below (If Applicable).  You have been referred to [facility]      If you need a refill on your cardiac medications before your next appointment, please call your pharmacy.                                 DASH Eating Plan DASH stands for "Dietary Approaches to Stop Hypertension." The DASH eating plan is a healthy eating plan that has been shown to reduce high blood pressure (hypertension). It may also reduce your risk for type 2 diabetes, heart disease, and stroke. The DASH eating plan may also help with weight loss. What are tips for following this plan?  General guidelines  Avoid eating more than 2,300 mg (milligrams) of salt (sodium) a day. If you have hypertension, you may need to reduce your sodium intake to 1,500 mg a day.  Limit alcohol intake to no more than 1 drink a day for nonpregnant women and 2 drinks a day for men. One drink equals 12 oz of beer, 5 oz of wine, or 1 oz of hard liquor.  Work with your health care provider to maintain a healthy body weight or to lose weight. Ask what an ideal weight is for you.  Get at least 30 minutes of exercise that causes your heart to beat faster (aerobic exercise) most days of the week. Activities may include walking, swimming, or biking.  Work with your health care provider or diet and nutrition specialist (dietitian) to adjust your eating plan to your individual calorie needs. Reading food labels   Check food labels for the amount of sodium per serving. Choose foods with less than 5 percent of the Daily Value of sodium. Generally, foods with less than 300 mg of sodium  per serving fit into this eating plan.  To find whole grains, look for the word "whole" as the first word in the ingredient list. Shopping  Buy products labeled as "low-sodium" or "no salt added."  Buy fresh foods. Avoid canned foods and premade or frozen meals. Cooking  Avoid adding salt when cooking. Use salt-free seasonings or herbs instead of table salt or sea salt. Check with your health care provider or pharmacist before using salt substitutes.  Do not fry foods. Cook foods using healthy methods such as baking, boiling, grilling, and broiling instead.  Cook with heart-healthy oils, such as olive, canola, soybean, or sunflower oil. Meal planning  Eat a balanced diet that includes: ? 5 or more servings of fruits and vegetables each day. At each meal, try to fill half of your plate with fruits and vegetables. ? Up to 6-8 servings of whole grains each day. ? Less than 6 oz of lean meat, poultry, or fish each day. A 3-oz serving of meat is about the same size as a deck of cards. One egg equals 1 oz. ? 2 servings of low-fat dairy each day. ? A serving of nuts, seeds, or beans 5 times each week. ? Heart-healthy fats. Healthy fats called Omega-3 fatty acids are found in foods such as flaxseeds and coldwater  fish, like sardines, salmon, and mackerel.  Limit how much you eat of the following: ? Canned or prepackaged foods. ? Food that is high in trans fat, such as fried foods. ? Food that is high in saturated fat, such as fatty meat. ? Sweets, desserts, sugary drinks, and other foods with added sugar. ? Full-fat dairy products.  Do not salt foods before eating.  Try to eat at least 2 vegetarian meals each week.  Eat more home-cooked food and less restaurant, buffet, and fast food.  When eating at a restaurant, ask that your food be prepared with less salt or no salt, if possible. What foods are recommended? The items listed may not be a complete list. Talk with your dietitian  about what dietary choices are best for you. Grains Whole-grain or whole-wheat bread. Whole-grain or whole-wheat pasta. Brown rice. [place]. Bulgur. Whole-grain and low-sodium cereals. Pita bread. Low-fat, low-sodium crackers. Whole-wheat flour tortillas. Vegetables Fresh or frozen vegetables (raw, steamed, roasted, or grilled). Low-sodium or reduced-sodium tomato and vegetable juice. Low-sodium or reduced-sodium tomato sauce and tomato paste. Low-sodium or reduced-sodium canned vegetables. Fruits All fresh, dried, or frozen fruit. Canned fruit in natural juice (without added sugar). Meat and other protein foods Skinless chicken or [place]. Ground chicken or [place]. Pork with fat trimmed off. Fish and seafood. Egg whites. Dried beans, peas, or lentils. Unsalted nuts, nut butters, and seeds. Unsalted canned beans. Lean cuts of beef with fat trimmed off. Low-sodium, lean deli meat. Dairy Low-fat (1%) or fat-free (skim) milk. Fat-free, low-fat, or reduced-fat cheeses. Nonfat, low-sodium ricotta or cottage cheese. Low-fat or nonfat yogurt. Low-fat, low-sodium cheese. Fats and oils Soft margarine without trans fats. Vegetable oil. Low-fat, reduced-fat, or light mayonnaise and salad dressings (reduced-sodium). Canola, safflower, olive, soybean, and sunflower oils. Avocado. Seasoning and other foods Herbs. Spices. Seasoning mixes without salt. Unsalted popcorn and pretzels. Fat-free sweets. What foods are not recommended? The items listed may not be a complete list. Talk with your dietitian about what dietary choices are best for you. Grains Baked goods made with fat, such as croissants, muffins, or some breads. Dry pasta or rice meal packs. Vegetables Creamed or fried vegetables. Vegetables in a cheese sauce. Regular canned vegetables (not low-sodium or reduced-sodium). Regular canned tomato sauce and paste (not low-sodium or reduced-sodium). Regular tomato and vegetable juice (not low-sodium or  reduced-sodium). Angie Fava. Olives. Fruits Canned fruit in a light or heavy syrup. Fried fruit. Fruit in cream or butter sauce. Meat and other protein foods Fatty cuts of meat. Ribs. Fried meat. Berniece Salines. Sausage. Bologna and other processed lunch meats. Salami. Fatback. Hotdogs. Bratwurst. Salted nuts and seeds. Canned beans with added salt. Canned or smoked fish. Whole eggs or egg yolks. Chicken or [place] with skin. Dairy Whole or 2% milk, cream, and half-and-half. Whole or full-fat cream cheese. Whole-fat or sweetened yogurt. Full-fat cheese. Nondairy creamers. Whipped toppings. Processed cheese and cheese spreads. Fats and oils Butter. Stick margarine. Lard. Shortening. Ghee. Bacon fat. Tropical oils, such as coconut, palm kernel, or palm oil. Seasoning and other foods Salted popcorn and pretzels. Onion salt, garlic salt, seasoned salt, table salt, and sea salt. Worcestershire sauce. Tartar sauce. Barbecue sauce. Teriyaki sauce. Soy sauce, including reduced-sodium. Steak sauce. Canned and packaged gravies. Fish sauce. Oyster sauce. Cocktail sauce. Horseradish that you find on the shelf. Ketchup. Mustard. Meat flavorings and tenderizers. Bouillon cubes. Hot sauce and Tabasco sauce. Premade or packaged marinades. Premade or packaged taco seasonings. Relishes. Regular salad dressings. Where to find more  information:  National Heart, Lung, and Blood Institute: https://wilson-eaton.com/  American Heart Association: www.heart.org Summary  The DASH eating plan is a healthy eating plan that has been shown to reduce high blood pressure (hypertension). It may also reduce your risk for type 2 diabetes, heart disease, and stroke.  With the DASH eating plan, you should limit salt (sodium) intake to 2,300 mg a day. If you have hypertension, you may need to reduce your sodium intake to 1,500 mg a day.  When on the DASH eating plan, aim to eat more fresh fruits and vegetables, whole grains, lean proteins, low-fat  dairy, and heart-healthy fats.  Work with your health care provider or diet and nutrition specialist (dietitian) to adjust your eating plan to your individual calorie needs. This information is not intended to replace advice given to you by your health care provider. Make sure you discuss any questions you have with your health care provider. Document Revised: 05/01/2017 Document Reviewed: 05/12/2016 Elsevier Patient Education  2020 [employer].

## 2020-02-29 NOTE — Progress Notes (Signed)
Cardiology Office Note    Date:  02/29/2020   ID:  Amber Stephenson, DOB [birth year], MRN [id number]  PCP:  [name], MD  Cardiologist: Kate Sable, MD (Inactive) --> Will switch to Dr. Harl Bowie  Chief Complaint  Patient presents with  . Follow-up    6 month visit    History of Present Illness:    Amber Stephenson is a 74 y.o. female with past medical history of permanent atrial fibrillation (on Eliquis), chronic diastolic CHF, HTN, HLD Type 2 DM, Stage 2-3 CKD, OSA (intolerant to CPAP), Bipolar Disorder and CAD (nonobstructive CAD by cath in 01/2018) who presents to the office today for 34-month follow-up.   She was last examined by Dr. Bronson Ing in 09/2019 and denied any recent chest pain or palpitations. She did have chronic dyspnea on exertion which was overall stable. She was experiencing frequent urination with Lasix, therefore this was reduced to 20mg  daily with instructions to take an extra tablet if needed for weight gain or edema.   In talking with the patient today, she reports having intermittent dyspnea which can occur at rest or with activity but typically improves with the use of her Albuterol nebulizer or inhaler. She was evaluated by [facility] in the past but has not followed up recently. She denies any specific orthopnea or PND. Lasix was increased to 40 mg daily in the interim as she had worsening edema on the lower dose. She denies any recent exertional chest pain or palpitations. Reports good compliance with Eliquis and denies missing any recent doses.  Past Medical History:  Diagnosis Date  . Atrial fibrillation with RVR 05/19/2017  . Bipolar I disorder 01/23/2015  . CAD (coronary artery disease)    Nonobstructive; Managed by Dr. Bronson Ing  . Cardiomegaly 01/12/2018  . Chronic anticoagulation 05/30/2015  . Chronic back pain    Lower back  . Chronic diastolic CHF (congestive heart failure) 05/30/2015  . Diabetes mellitus, type 2, without  complication   . Diabetic neuropathy 02/06/2016  . Dysrhythmia    A-Fib  . Essential hypertension   . Herpes genitalis in women 07/16/2015  . Hyperlipidemia   . Hypertension   . Hypoxia 02/01/2019  . Insomnia 01/23/2015  . Lipoma 02/08/2015  . Major depressive disorder   . Migraine headache with aura 02/12/2016  . 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[place]) 04/21/2019  . NAFL (nonalcoholic fatty liver) 1/82/9937  . OSA (obstructive sleep apnea) 02/24/2019   10/09/2018 - HST  - AHI 40.6   . Pulmonary hypertension   . Renal insufficiency    Managed by Dr. Wallace Keller  . RLS (restless legs syndrome) 04/27/2015    Past Surgical History:  Procedure Laterality Date  . BREAST REDUCTION SURGERY    . EYE SURGERY Right    cateracts  . HAMMER TOE SURGERY    . LEFT HEART CATH AND CORONARY ANGIOGRAPHY N/A 02/03/2018   Procedure: LEFT HEART CATH AND CORONARY ANGIOGRAPHY;  Surgeon: Martinique, Peter M, MD;  Location: [place] [facility];  Service: Cardiovascular;  Laterality: N/A;  . REVERSE SHOULDER ARTHROPLASTY Right 08/19/2019   Procedure: REVERSE SHOULDER ARTHROPLASTY;  Surgeon: Netta Cedars, MD;  Location: [facility];  Service: Orthopedics;  Laterality: Right;  interscalene block  . SHOULDER SURGERY Right    "I BROKE MY SHOUDLER  . THIGH SURGERY     "TO REMOVE A TUMOR "    Current Medications: Outpatient Medications Prior to Visit  Medication Sig Dispense Refill  . acetaminophen (TYLENOL) 325 MG  tablet Take 325-650 mg by mouth every 6 (six) hours as needed for mild pain, moderate pain or headache.     . albuterol (PROVENTIL) (2.5 MG/3ML) 0.083% nebulizer solution Take 3 mLs (2.5 mg total) by nebulization every 6 (six) hours as needed for wheezing or shortness of breath. 150 mL 1  . ARIPiprazole (ABILIFY) 10 MG tablet Take 1 tablet (10 mg total) by mouth once for 1 dose. 3 tablet 4  . atorvastatin (LIPITOR) 40 MG tablet Take 1 tablet (40 mg total) by mouth daily. 90 tablet 3  . budesonide-formoterol  (SYMBICORT) 80-4.5 MCG/ACT inhaler Inhale 2 puffs into the lungs 2 (two) times daily. 2 Inhaler 5  . carbamazepine (CARBATROL) 100 MG 12 hr capsule Take 1 capsule (100 mg total) by mouth daily. 90 capsule 1  . Cholecalciferol (VITAMIN D3) 1000 UNITS CAPS Take 1,000 Units by mouth daily.    Marland Kitchen diltiazem (CARDIZEM CD) 120 MG 24 hr capsule TAKE 1 CAPSULE AT BEDTIME 90 capsule 3  . ELIQUIS 5 MG TABS tablet TAKE 1 TABLET TWICE A DAY (Patient taking differently: Take 5 mg by mouth 2 (two) times daily. ) 180 tablet 3  . escitalopram (LEXAPRO) 10 MG tablet Take 1 tablet (10 mg total) by mouth daily. 30 tablet 2  . fluconazole (DIFLUCAN) 150 MG tablet TAKE 1 TABLET(150 MG) BY MOUTH 1 TIME FOR 1 DOSE 1 tablet 1  . flurazepam (DALMANE) 30 MG capsule Take 1 capsule (30 mg total) by mouth at bedtime as needed for sleep. 30 capsule 3  . furosemide (LASIX) 40 MG tablet TAKE 1 TABLET EVERY MORNING  (DOSE INCREASED) 90 tablet 3  . Insulin Glargine (LANTUS SOLOSTAR) 100 UNIT/ML Solostar Pen INJECT 60 UNITS UNDER THE SKIN TWICE A DAY (Patient taking differently: Inject 60 Units into the skin 2 (two) times daily. ) 90 mL 0  . LORazepam (ATIVAN) 1 MG tablet Take 1-2 tablets (1-2 mg total) by mouth See admin instructions. Take 1 tab (1 mg) in the morning and 2 tabs (2 mg ) at bedtime 90 tablet 4  . metaxalone (SKELAXIN) 800 MG tablet Take 1 tablet (800 mg total) by mouth 3 (three) times daily as needed for muscle spasms. 3 months supply 270 tablet 3  . metoprolol tartrate (LOPRESSOR) 100 MG tablet TAKE 1 TABLET TWICE A DAY WITH METOPROLOL 25 MG TO EQUAL 125 MG 180 tablet 3  . metoprolol tartrate (LOPRESSOR) 25 MG tablet TAKE 1 TABLET TWICE A DAY (TO BE TAKEN ALONG WITH THE LOPRESSOR 100 MG TWICE A DAY) 180 tablet 1  . nystatin (MYCOSTATIN/NYSTOP) powder Apply topically 3 (three) times daily. x2 weeks (for groin/breast rash) 60 g 1  . oxyCODONE-acetaminophen (PERCOCET/ROXICET) 5-325 MG tablet Take 1 tablet by mouth every 6  (six) hours as needed. for pain 120 tablet 0  . potassium chloride SA (KLOR-CON) 20 MEQ tablet Take 1 tablet (20 mEq total) by mouth daily. (Patient taking differently: Take 20 mEq by mouth every evening. ) 90 tablet 3  . pregabalin (LYRICA) 200 MG capsule Take 1 capsule (200 mg total) by mouth 3 (three) times daily. 90 capsule 2  . PROAIR HFA 108 (90 Base) MCG/ACT inhaler Inhale 1-2 puffs into the lungs every 6 (six) hours as needed. As needed 18 g 11  . rizatriptan (MAXALT) 10 MG tablet Take 1 tablet at onset of migraine. Do not take more than 2-3 a week 10 tablet 5  . Semaglutide (RYBELSUS) 7 MG TABS Take 7 mg by mouth daily.     Marland Kitchen  SUMAtriptan (IMITREX) 100 MG tablet TAKE 1 TABLET BY MOUTH IF NEEDED FOR MIGRAINE(MAY REPEAT IN 2 HOURS IF HEADACHE PERSISTS OR RECURS) 10 tablet 5  . topiramate (TOPAMAX) 25 MG tablet Take 2 tablets in AM, 3 tablets in PM 150 tablet 11  . triamcinolone ointment (KENALOG) 0.5 % Apply 1 application topically 2 (two) times daily as needed (skin irritation/rash.). 30 g 2  . VASCEPA 1 g CAPS TAKE 2 CAPSULES TWICE A DAY WITH MEALS (Patient taking differently: Take 2 g by mouth in the morning and at bedtime. ) 360 capsule 3  . ibuprofen (ADVIL) 800 MG tablet Take 1 tablet (800 mg total) by mouth every 8 (eight) hours as needed. 30 tablet 5  . metroNIDAZOLE (FLAGYL) 500 MG tablet Take 1 tablet (500 mg total) by mouth 3 (three) times daily. 21 tablet 0  . naloxone (NARCAN) nasal spray 4 mg/0.1 mL Place 1 spray into the nose once as needed (opiod overdose).    . phenazopyridine (PYRIDIUM) 200 MG tablet Take 1 tablet (200 mg total) by mouth 4 (four) times daily as needed for pain (urinary frequency &/or pain). 8 tablet 0   Facility-Administered Medications Prior to Visit  Medication Dose Route Frequency Provider Last Rate Last Admin  . bupivacaine-epinephrine (MARCAINE W/ EPI) 0.5% -1:200000 injection    Anesthesia Intra-op Effie Berkshire, MD   12 mL at 01/21/19 0935      Allergies:   Ivp dye [iodinated diagnostic agents] and Latex   Social History   Socioeconomic History  . Marital status: Married    Spouse name: Orpah Greek  . Number of children: 3  . Years of education: 7  . Highest education level: Bachelor's degree (e.g., BA, AB, BS)  Occupational History  . Occupation: Retired    Comment: marketing  Tobacco Use  . Smoking status: Never Smoker  . Smokeless tobacco: Never Used  Vaping Use  . Vaping Use: Never used  Substance and Sexual Activity  . Alcohol use: Not Currently    Alcohol/week: 0.0 standard drinks  . Drug use: No  . Sexual activity: Yes    Partners: Male    Birth control/protection: Post-menopausal  Other Topics Concern  . Not on file  Social History Narrative   Lives at home with husband.       Right handed   Social Determinants of Health   Financial Resource Strain:   . Difficulty of Paying Living Expenses: Not on file  Food Insecurity:   . Worried About Charity fundraiser in the Last Year: Not on file  . Ran Out of Food in the Last Year: Not on file  Transportation Needs:   . Lack of Transportation (Medical): Not on file  . Lack of Transportation (Non-Medical): Not on file  Physical Activity:   . Days of Exercise per Week: Not on file  . Minutes of Exercise per Session: Not on file  Stress:   . Feeling of Stress : Not on file  Social Connections:   . Frequency of Communication with Friends and Family: Not on file  . Frequency of Social Gatherings with Friends and Family: Not on file  . Attends Religious Services: Not on file  . Active Member of Clubs or Organizations: Not on file  . Attends Archivist Meetings: Not on file  . Marital Status: Not on file     Family History:  The patient's family history includes Alcohol abuse in her sister; Alzheimer's disease in her father; Diabetes in  her brother, mother, and sister; Heart disease in her brother, mother, and sister; Mental illness in her brother.    Review of Systems:   Please see the history of present illness.     General:  No chills, fever, night sweats or weight changes.  Cardiovascular:  No chest pain, orthopnea, palpitations, paroxysmal nocturnal dyspnea. Positive for dyspnea on exertion and edema (improved).  Dermatological: No rash, lesions/masses Respiratory: No cough, dyspnea Urologic: No hematuria, dysuria Abdominal:   No nausea, vomiting, diarrhea, bright red blood per rectum, melena, or hematemesis Neurologic:  No visual changes, wkns, changes in mental status. All other systems reviewed and are otherwise negative except as noted above.   Physical Exam:    VS:  BP 118/62   Pulse 64   Ht 5\' 2"  (1.575 m)   Wt 184 lb (83.5 kg)   SpO2 94%   BMI 33.65 kg/m    General: Well developed, well nourished,female appearing in no acute distress. Head: Normocephalic, atraumatic. Neck: No carotid bruits. JVD not elevated.  Lungs: Respirations regular and unlabored, without wheezes or rales.  Heart: Irregularly irregular. No S3 or S4.  No murmur, no rubs, or gallops appreciated. Abdomen: Appears non-distended. No obvious abdominal masses. Msk:  Strength and tone appear normal for age. No obvious joint deformities or effusions. Extremities: No clubbing or cyanosis. No lower extremity edema.  Distal pedal pulses are 2+ bilaterally. Neuro: Alert and oriented X 3. Moves all extremities spontaneously. No focal deficits noted. Psych:  Responds to questions appropriately with a normal affect. Skin: No rashes or lesions noted  Wt Readings from Last 3 Encounters:  02/29/20 184 lb (83.5 kg)  02/10/20 183 lb 3.2 oz (83.1 kg)  01/17/20 184 lb (83.5 kg)      Studies/Labs Reviewed:   EKG:  EKG is not ordered today.    Recent Labs: 08/22/2019: B Natriuretic Peptide 151.0 08/29/2019: TSH 2.340 11/29/2019: ALT 12; BUN 13; Creatinine, Ser 0.84; Hemoglobin 13.8; Platelets 196; Potassium 4.1; Sodium 140   Lipid Panel    Component  Value Date/Time   CHOL 128 08/29/2019 1048   TRIG 160 (H) 08/29/2019 1048   HDL 39 (L) 08/29/2019 1048   CHOLHDL 3.3 08/29/2019 1048   LDLCALC 62 08/29/2019 1048   LDLDIRECT 62 03/17/2016 1614    Additional studies/ records that were reviewed today include:   Cardiac Catheterization: 01/2018  Prox LAD lesion is 40% stenosed.  Mid LAD lesion is 30% stenosed.  Ost 1st Mrg to 1st Mrg lesion is 30% stenosed.  LV end diastolic pressure is moderately elevated.   1. Nonobstructive CAD with left dominant circulation 2. Moderately elevated LVEDP (post hydration)  Plan: medical therapy. Will resume Eliquis this pm. Continue diuretic therapy.  No indication for antiplatelet therapy at this time.   Echocardiogram: 01/2019 IMPRESSIONS    1. The left ventricle has normal systolic function, with an ejection  fraction of 55-60%. The cavity size was normal. There is mildly increased  left ventricular wall thickness. Left ventricular diastolic function could  not be evaluated secondary to  atrial fibrillation.  2. The right ventricle has normal systolic function. The cavity was  normal. There is no increase in right ventricular wall thickness.  3. The mitral valve is grossly normal.  4. The aortic valve is grossly normal. Aortic valve regurgitation is mild  by color flow Doppler.  5. The aorta is normal unless otherwise noted.  6. The aortic root and ascending aorta are normal in size and structure.  7. The atrial septum is grossly normal.    Assessment:    1. Permanent atrial fibrillation ([place])   2. Chronic diastolic CHF (congestive heart failure) ([place])   3. Dizziness   4. Coronary artery disease involving native coronary artery of native heart without angina pectoris   5. Essential hypertension   6. Chronic obstructive pulmonary disease, unspecified COPD type ([place])   7. Need for immunization against influenza      Plan:   In order of problems listed above:  1.  Permanent Atrial Fibrillation - She denies any recent palpitations and heart rate is well controlled in the 60's during today's visit.  Continue Cardizem CD 120 mg daily and Toprol-XL 125 mg twice daily for rate control. - No reports of active bleeding. CBC in 11/2019 showed hemoglobin was stable at 13.8 with platelets at 196. She remains on Eliquis 5 mg twice daily for anticoagulation.  2. Chronic Diastolic CHF - She does have baseline dyspnea on exertion as outlined above but denies any specific orthopnea, PND or lower extremity edema. Continue Lasix 40 mg daily. Creatinine was stable at 0.84 by recent labs in 11/2019. - We reviewed the importance of limiting sodium in her diet.   3. Dizziness - She describes episodes of dizziness which can occur at rest or with activity and she feels as if the room is spinning. Orthostatics were checked during today's visit and negative. Will provide a trial of PRN Meclizine to see if this helps with symptoms. If no improvement, would defer to her PCP but she may benefit from ENT referral.  4. CAD - She had mild nonobstructive CAD by cardiac catheterization in 01/2018 as outlined above. She has baseline dyspnea on exertion which has been stable for years per her report and denies any recent exertional chest pain. Continue beta-blocker and statin therapy. She is no longer on ASA given the need for anticoagulation.  5. HTN - BP is well controlled at 118/62 during today's visit. Continue current medication regimen.  6. COPD/OSA  - She reports having baseline dyspnea at rest and with activity which has been occurring for several years. Was previously evaluated by [facility] but has not followed up recently. Will re-enter a referral.  - She does have known OSA but has been intolerant to CPAP despite multiple attempts.   7. Need for Influenza Vaccination - Flu shot was administered by nursing staff during today's visit.    Medication Adjustments/Labs  and Tests Ordered: Current medicines are reviewed at length with the patient today.  Concerns regarding medicines are outlined above.  Medication changes, Labs and Tests ordered today are listed in the Patient Instructions below. Patient Instructions  Medication Instructions:   Continue to monitor Heart rate at home. Normal heart rate can range between 50 beats per minute to 100 beats per minute.   TAKE MECLIZINE 12.5 MG 3 TIMES DAILY AS NEEDED     Labwork:  None  Testing/Procedures:   Follow-Up:  6 MONTHS   Any Other Special Instructions Will Be Listed Below (If Applicable).  You have been referred to [facility]      If you need a refill on your cardiac medications before your next appointment, please call your pharmacy.                                 DASH Eating Plan DASH stands for "Dietary Approaches to Stop Hypertension." The DASH  eating plan is a healthy eating plan that has been shown to reduce high blood pressure (hypertension). It may also reduce your risk for type 2 diabetes, heart disease, and stroke. The DASH eating plan may also help with weight loss. What are tips for following this plan?  General guidelines  Avoid eating more than 2,300 mg (milligrams) of salt (sodium) a day. If you have hypertension, you may need to reduce your sodium intake to 1,500 mg a day.  Limit alcohol intake to no more than 1 drink a day for nonpregnant women and 2 drinks a day for men. One drink equals 12 oz of beer, 5 oz of wine, or 1 oz of hard liquor.  Work with your health care provider to maintain a healthy body weight or to lose weight. Ask what an ideal weight is for you.  Get at least 30 minutes of exercise that causes your heart to beat faster (aerobic exercise) most days of the week. Activities may include walking, swimming, or biking.  Work with your health care provider or diet and nutrition specialist (dietitian) to  adjust your eating plan to your individual calorie needs. Reading food labels   Check food labels for the amount of sodium per serving. Choose foods with less than 5 percent of the Daily Value of sodium. Generally, foods with less than 300 mg of sodium per serving fit into this eating plan.  To find whole grains, look for the word "whole" as the first word in the ingredient list. Shopping  Buy products labeled as "low-sodium" or "no salt added."  Buy fresh foods. Avoid canned foods and premade or frozen meals. Cooking  Avoid adding salt when cooking. Use salt-free seasonings or herbs instead of table salt or sea salt. Check with your health care provider or pharmacist before using salt substitutes.  Do not fry foods. Cook foods using healthy methods such as baking, boiling, grilling, and broiling instead.  Cook with heart-healthy oils, such as olive, canola, soybean, or sunflower oil. Meal planning  Eat a balanced diet that includes: ? 5 or more servings of fruits and vegetables each day. At each meal, try to fill half of your plate with fruits and vegetables. ? Up to 6-8 servings of whole grains each day. ? Less than 6 oz of lean meat, poultry, or fish each day. A 3-oz serving of meat is about the same size as a deck of cards. One egg equals 1 oz. ? 2 servings of low-fat dairy each day. ? A serving of nuts, seeds, or beans 5 times each week. ? Heart-healthy fats. Healthy fats called Omega-3 fatty acids are found in foods such as flaxseeds and coldwater fish, like sardines, salmon, and mackerel.  Limit how much you eat of the following: ? Canned or prepackaged foods. ? Food that is high in trans fat, such as fried foods. ? Food that is high in saturated fat, such as fatty meat. ? Sweets, desserts, sugary drinks, and other foods with added sugar. ? Full-fat dairy products.  Do not salt foods before eating.  Try to eat at least 2 vegetarian meals each week.  Eat more home-cooked  food and less restaurant, buffet, and fast food.  When eating at a restaurant, ask that your food be prepared with less salt or no salt, if possible. What foods are recommended? The items listed may not be a complete list. Talk with your dietitian about what dietary choices are best for you. Grains Whole-grain or whole-wheat bread.  Whole-grain or whole-wheat pasta. Brown rice. [place] Morrow. Bulgur. Whole-grain and low-sodium cereals. Pita bread. Low-fat, low-sodium crackers. Whole-wheat flour tortillas. Vegetables Fresh or frozen vegetables (raw, steamed, roasted, or grilled). Low-sodium or reduced-sodium tomato and vegetable juice. Low-sodium or reduced-sodium tomato sauce and tomato paste. Low-sodium or reduced-sodium canned vegetables. Fruits All fresh, dried, or frozen fruit. Canned fruit in natural juice (without added sugar). Meat and other protein foods Skinless chicken or [place]. Ground chicken or [place]. Pork with fat trimmed off. Fish and seafood. Egg whites. Dried beans, peas, or lentils. Unsalted nuts, nut butters, and seeds. Unsalted canned beans. Lean cuts of beef with fat trimmed off. Low-sodium, lean deli meat. Dairy Low-fat (1%) or fat-free (skim) milk. Fat-free, low-fat, or reduced-fat cheeses. Nonfat, low-sodium ricotta or cottage cheese. Low-fat or nonfat yogurt. Low-fat, low-sodium cheese. Fats and oils Soft margarine without trans fats. Vegetable oil. Low-fat, reduced-fat, or light mayonnaise and salad dressings (reduced-sodium). Canola, safflower, olive, soybean, and sunflower oils. Avocado. Seasoning and other foods Herbs. Spices. Seasoning mixes without salt. Unsalted popcorn and pretzels. Fat-free sweets. What foods are not recommended? The items listed may not be a complete list. Talk with your dietitian about what dietary choices are best for you. Grains Baked goods made with fat, such as croissants, muffins, or some breads. Dry pasta or rice meal  packs. Vegetables Creamed or fried vegetables. Vegetables in a cheese sauce. Regular canned vegetables (not low-sodium or reduced-sodium). Regular canned tomato sauce and paste (not low-sodium or reduced-sodium). Regular tomato and vegetable juice (not low-sodium or reduced-sodium). Angie Fava. Olives. Fruits Canned fruit in a light or heavy syrup. Fried fruit. Fruit in cream or butter sauce. Meat and other protein foods Fatty cuts of meat. Ribs. Fried meat. Berniece Salines. Sausage. Bologna and other processed lunch meats. Salami. Fatback. Hotdogs. Bratwurst. Salted nuts and seeds. Canned beans with added salt. Canned or smoked fish. Whole eggs or egg yolks. Chicken or [place] with skin. Dairy Whole or 2% milk, cream, and half-and-half. Whole or full-fat cream cheese. Whole-fat or sweetened yogurt. Full-fat cheese. Nondairy creamers. Whipped toppings. Processed cheese and cheese spreads. Fats and oils Butter. Stick margarine. Lard. Shortening. Ghee. Bacon fat. Tropical oils, such as coconut, palm kernel, or palm oil. Seasoning and other foods Salted popcorn and pretzels. Onion salt, garlic salt, seasoned salt, table salt, and sea salt. Worcestershire sauce. Tartar sauce. Barbecue sauce. Teriyaki sauce. Soy sauce, including reduced-sodium. Steak sauce. Canned and packaged gravies. Fish sauce. Oyster sauce. Cocktail sauce. Horseradish that you find on the shelf. Ketchup. Mustard. Meat flavorings and tenderizers. Bouillon cubes. Hot sauce and Tabasco sauce. Premade or packaged marinades. Premade or packaged taco seasonings. Relishes. Regular salad dressings. Where to find more information:  National Heart, Lung, and [place]: https://wilson-eaton.com/  American Heart Association: www.heart.org Summary  The DASH eating plan is a healthy eating plan that has been shown to reduce high blood pressure (hypertension). It may also reduce your risk for type 2 diabetes, heart disease, and stroke.  With the DASH eating  plan, you should limit salt (sodium) intake to 2,300 mg a day. If you have hypertension, you may need to reduce your sodium intake to 1,500 mg a day.  When on the DASH eating plan, aim to eat more fresh fruits and vegetables, whole grains, lean proteins, low-fat dairy, and heart-healthy fats.  Work with your health care provider or diet and nutrition specialist (dietitian) to adjust your eating plan to your individual calorie needs. This information is not intended to replace advice given  to you by your health care provider. Make sure you discuss any questions you have with your health care provider. Document Revised: 05/01/2017 Document Reviewed: 05/12/2016 Elsevier Patient Education  2020 [place], Erma Heritage, [place]  02/29/2020 5:20 PM    [facility] [address] [place], [place] [address] Phone: [phone] Fax: [phone]

## 2020-03-06 ENCOUNTER — Telehealth: Payer: Self-pay | Admitting: Student

## 2020-03-06 NOTE — Telephone Encounter (Signed)
Pt states she had a recent visit in this office and wants to know which Flu shot she rec'd   Please call [phone]    Thanks renee

## 2020-03-06 NOTE — Telephone Encounter (Signed)
Attempt to reach,states hang up and try again later.

## 2020-03-08 NOTE — Telephone Encounter (Signed)
Returned call to pt. No answer. Unable to leave msg.

## 2020-03-09 NOTE — Telephone Encounter (Signed)
Returned pt call. No answer. Unable to leave msg.

## 2020-03-09 NOTE — Telephone Encounter (Signed)
New message    Patient returned called to nurse

## 2020-03-13 DIAGNOSIS — M79675 Pain in left toe(s): Secondary | ICD-10-CM | POA: Diagnosis not present

## 2020-03-13 DIAGNOSIS — M19072 Primary osteoarthritis, left ankle and foot: Secondary | ICD-10-CM | POA: Diagnosis not present

## 2020-03-15 NOTE — Telephone Encounter (Signed)
I spoke with patient.She received the regular flu shot. She will discuss with her pcp if she will need a booster late next year.

## 2020-03-24 ENCOUNTER — Other Ambulatory Visit (HOSPITAL_COMMUNITY): Payer: Self-pay | Admitting: Psychiatry

## 2020-03-26 ENCOUNTER — Encounter: Payer: Self-pay | Admitting: Physical Medicine and Rehabilitation

## 2020-03-26 ENCOUNTER — Other Ambulatory Visit: Payer: Self-pay

## 2020-03-26 ENCOUNTER — Encounter
Payer: Medicare Other | Attending: Physical Medicine and Rehabilitation | Admitting: Physical Medicine and Rehabilitation

## 2020-03-26 VITALS — BP 117/62 | HR 66 | Temp 97.7°F | Ht 62.0 in | Wt 189.2 lb

## 2020-03-26 DIAGNOSIS — M7918 Myalgia, other site: Secondary | ICD-10-CM | POA: Insufficient documentation

## 2020-03-26 DIAGNOSIS — G894 Chronic pain syndrome: Secondary | ICD-10-CM | POA: Insufficient documentation

## 2020-03-26 DIAGNOSIS — M797 Fibromyalgia: Secondary | ICD-10-CM | POA: Diagnosis not present

## 2020-03-26 DIAGNOSIS — M19011 Primary osteoarthritis, right shoulder: Secondary | ICD-10-CM | POA: Diagnosis not present

## 2020-03-26 DIAGNOSIS — M19012 Primary osteoarthritis, left shoulder: Secondary | ICD-10-CM | POA: Insufficient documentation

## 2020-03-26 MED ORDER — OXYCODONE-ACETAMINOPHEN 5-325 MG PO TABS
1.0000 | ORAL_TABLET | Freq: Four times a day (QID) | ORAL | 0 refills | Status: DC | PRN
Start: 2020-03-26 — End: 2020-04-24

## 2020-03-26 NOTE — Progress Notes (Signed)
Subjective:    Patient ID: Amber Stephenson, female    DOB: [birth year], 74 y.o.   MRN: [id number]  HPI CAD,pulmonary HTN-and DM- with CKD- Stage III here for right low back pain with sciaticaAs well as upper back/neck pain/myofascial pain here forf/u.    Went to [employer] last week- enjoyed the drive and time- but colors not changing yet much.   Everything pretty much the same.   More pain- lately-  ~ 1 week ago.  Weather related?   Got the back Buddy- hasn't used it- didn't know how to use.       Pain Inventory Average Pain 6 Pain Right Now 6 My pain is constant and stabbing  In the last 24 hours, has pain interfered with the following? General activity 7 Relation with others 7 Enjoyment of life 7 What TIME of day is your pain at its worst? morning  Sleep (in general) Good  Pain is worse with: bending Pain improves with: rest Relief from Meds: 7  Family History  Problem Relation Age of Onset  . Diabetes Mother   . Heart disease Mother   . Heart disease Brother   . Heart disease Sister        CABG  . Diabetes Sister   . Alzheimer's disease Father   . Alcohol abuse Sister   . Mental illness Brother   . Diabetes Brother    Social History   Socioeconomic History  . Marital status: Married    Spouse name: Orpah Greek  . Number of children: 3  . Years of education: 45  . Highest education level: Bachelor's degree (e.g., BA, AB, BS)  Occupational History  . Occupation: Retired    Comment: marketing  Tobacco Use  . Smoking status: Never Smoker  . Smokeless tobacco: Never Used  Vaping Use  . Vaping Use: Never used  Substance and Sexual Activity  . Alcohol use: Not Currently    Alcohol/week: 0.0 standard drinks  . Drug use: No  . Sexual activity: Yes    Partners: Male    Birth control/protection: Post-menopausal  Other Topics Concern  . Not on file  Social History Narrative   Lives at home with husband.       Right handed   Social  Determinants of Health   Financial Resource Strain:   . Difficulty of Paying Living Expenses: Not on file  Food Insecurity:   . Worried About Charity fundraiser in the Last Year: Not on file  . Ran Out of Food in the Last Year: Not on file  Transportation Needs:   . Lack of Transportation (Medical): Not on file  . Lack of Transportation (Non-Medical): Not on file  Physical Activity:   . Days of Exercise per Week: Not on file  . Minutes of Exercise per Session: Not on file  Stress:   . Feeling of Stress : Not on file  Social Connections:   . Frequency of Communication with Friends and Family: Not on file  . Frequency of Social Gatherings with Friends and Family: Not on file  . Attends Religious Services: Not on file  . Active Member of Clubs or Organizations: Not on file  . Attends Archivist Meetings: Not on file  . Marital Status: Not on file   Past Surgical History:  Procedure Laterality Date  . BREAST REDUCTION SURGERY    . EYE SURGERY Right    cateracts  . HAMMER TOE SURGERY    .  LEFT HEART CATH AND CORONARY ANGIOGRAPHY N/A 02/03/2018   Procedure: LEFT HEART CATH AND CORONARY ANGIOGRAPHY;  Surgeon: Martinique, Peter M, MD;  Location: [facility];  Service: Cardiovascular;  Laterality: N/A;  . REVERSE SHOULDER ARTHROPLASTY Right 08/19/2019   Procedure: REVERSE SHOULDER ARTHROPLASTY;  Surgeon: Netta Cedars, MD;  Location: [facility];  Service: Orthopedics;  Laterality: Right;  interscalene block  . SHOULDER SURGERY Right    "I BROKE MY SHOUDLER  . THIGH SURGERY     "TO REMOVE A TUMOR "   Past Surgical History:  Procedure Laterality Date  . BREAST REDUCTION SURGERY    . EYE SURGERY Right    cateracts  . HAMMER TOE SURGERY    . LEFT HEART CATH AND CORONARY ANGIOGRAPHY N/A 02/03/2018   Procedure: LEFT HEART CATH AND CORONARY ANGIOGRAPHY;  Surgeon: Martinique, Peter M, MD;  Location: [facility];  Service: Cardiovascular;  Laterality: N/A;  . REVERSE SHOULDER  ARTHROPLASTY Right 08/19/2019   Procedure: REVERSE SHOULDER ARTHROPLASTY;  Surgeon: Netta Cedars, MD;  Location: [facility];  Service: Orthopedics;  Laterality: Right;  interscalene block  . SHOULDER SURGERY Right    "I BROKE MY SHOUDLER  . THIGH SURGERY     "TO REMOVE A TUMOR "   Past Medical History:  Diagnosis Date  . Atrial fibrillation with RVR 05/19/2017  . Bipolar I disorder 01/23/2015  . CAD (coronary artery disease)    Nonobstructive; Managed by Dr. Bronson Ing  . Cardiomegaly 01/12/2018  . Chronic anticoagulation 05/30/2015  . Chronic back pain    Lower back  . Chronic diastolic CHF (congestive heart failure) 05/30/2015  . Diabetes mellitus, type 2, without complication   . Diabetic neuropathy 02/06/2016  . Dysrhythmia    A-Fib  . Essential hypertension   . Herpes genitalis in women 07/16/2015  . Hyperlipidemia   . Hypertension   . Hypoxia 02/01/2019  . Insomnia 01/23/2015  . Lipoma 02/08/2015  . Major depressive disorder   . Migraine headache with aura 02/12/2016  . 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[place]) 04/21/2019  . NAFL (nonalcoholic fatty liver) 0/62/6948  . OSA (obstructive sleep apnea) 02/24/2019   10/09/2018 - HST  - AHI 40.6   . Pulmonary hypertension   . Renal insufficiency    Managed by Dr. Wallace Keller  . RLS (restless legs syndrome) 04/27/2015   There were no vitals taken for this visit.  Opioid Risk Score:   Fall Risk Score:  `1  Depression screen PHQ 2/9  Depression screen [facility] 2/9 02/10/2020 12/23/2019 11/30/2019 11/29/2019 08/29/2019 08/22/2019 08/08/2019  Decreased Interest 0 0 0 0 0 0 0  Down, Depressed, Hopeless 0 0 0 0 0 0 0  PHQ - 2 Score 0 0 0 0 0 0 0  Altered sleeping - - - - - 0 -  Tired, decreased energy - - - - - 3 -  Change in appetite - - - - - 0 -  Feeling bad or failure about yourself  - - - - - 0 -  Trouble concentrating - - - - - 0 -  Moving slowly or fidgety/restless - - - - - 0 -  Suicidal thoughts - - - - - 0 -  PHQ-9 Score - - - - - 3 -   Difficult doing work/chores - - - - - Somewhat difficult -  Some recent data might be hidden   Review of Systems An entire ROS was completed and found to be negative except for HPI.  Objective:   Physical Exam  Awake, alert, appropriate, slower to move today- due to pain, accompanied by husband, NAD Brought back buddy Has trigger points in traditional areas- low R back, scalenes, upper traps, levators and rhomboids        Assessment & Plan:   CAD,pulmonary HTN-and DM- with CKD- Stage III here for right low back pain with sciaticaAs well as upper back/neck pain/myofascial pain here forf/u.     1. Youtube videos - for Theracane/Back buddy.    2. Showed pt and husband directly how to do myofascial release with back buddy- went over each knob.   3. Refilled Percocet 5/325 mg q6 hours prn- #120  4. Patient here for trigger point injections for  Consent done and on chart.  Cleaned areas with alcohol and injected using a 27 gauge 1.5 inch needle  Injected 6cc Using 1% Lidocaine with no EPI  Upper traps B/L Levators b/L Posterior scalenes B/L Middle scalenes Splenius Capitus- didn't inject Pectoralis Major- didnd't inject Rhomboids- B/L injections Infraspinatus Teres Major/minor Thoracic paraspinals Lumbar paraspinals B/L and 2 more on R lumbar paraspinals Other injections-    Patient's level of pain prior was 7/10 Current level of pain after injections is  6/10  There was no bleeding or complications.  Patient was advised to drink a lot of water on day after injections to flush system Will have increased soreness for 12-48 hours after injections.  Can use Lidocaine patches the day AFTER injections Can use theracane on day of injections in places didn't inject Can use heating pad 4-6 hours AFTER injections  5. F/u in 1 month- for injections   I spent a total of 30 minutes on visit- as detailed above.

## 2020-03-26 NOTE — Patient Instructions (Signed)
CAD,pulmonary HTN-and DM- with CKD- Stage III here for right low back pain with sciaticaAs well as upper back/neck pain/myofascial pain here forf/u.     1. Youtube videos - for Theracane/Back buddy.    2. Showed pt and husband directly how to do myofascial release with back buddy- went over each knob.   3. Refilled Percocet 5/325 mg q6 hours prn- #120  4. Patient here for trigger point injections for  Consent done and on chart.  Cleaned areas with alcohol and injected using a 27 gauge 1.5 inch needle  Injected 6cc Using 1% Lidocaine with no EPI  Upper traps B/L Levators b/L Posterior scalenes B/L Middle scalenes Splenius Capitus- didn't inject Pectoralis Major- didnd't inject Rhomboids- B/L injections Infraspinatus Teres Major/minor Thoracic paraspinals Lumbar paraspinals B/L and 2 more on R lumbar paraspinals Other injections-    Patient's level of pain prior was 7/10 Current level of pain after injections is  6/10  There was no bleeding or complications.  Patient was advised to drink a lot of water on day after injections to flush system Will have increased soreness for 12-48 hours after injections.  Can use Lidocaine patches the day AFTER injections Can use theracane on day of injections in places didn't inject Can use heating pad 4-6 hours AFTER injections  5. F/u in 1 month- for injections

## 2020-03-27 ENCOUNTER — Other Ambulatory Visit (HOSPITAL_COMMUNITY): Payer: Self-pay | Admitting: Psychiatry

## 2020-03-27 DIAGNOSIS — F251 Schizoaffective disorder, depressive type: Secondary | ICD-10-CM | POA: Diagnosis not present

## 2020-03-29 ENCOUNTER — Other Ambulatory Visit: Payer: Self-pay | Admitting: Student

## 2020-03-29 ENCOUNTER — Other Ambulatory Visit: Payer: Self-pay | Admitting: Family Medicine

## 2020-03-29 ENCOUNTER — Telehealth: Payer: Self-pay | Admitting: Student

## 2020-03-29 DIAGNOSIS — Z794 Long term (current) use of insulin: Secondary | ICD-10-CM | POA: Diagnosis not present

## 2020-03-29 DIAGNOSIS — E1140 Type 2 diabetes mellitus with diabetic neuropathy, unspecified: Secondary | ICD-10-CM | POA: Diagnosis not present

## 2020-03-29 DIAGNOSIS — N183 Chronic kidney disease, stage 3 unspecified: Secondary | ICD-10-CM | POA: Diagnosis not present

## 2020-03-29 DIAGNOSIS — E1122 Type 2 diabetes mellitus with diabetic chronic kidney disease: Secondary | ICD-10-CM | POA: Diagnosis not present

## 2020-03-29 DIAGNOSIS — I5032 Chronic diastolic (congestive) heart failure: Secondary | ICD-10-CM

## 2020-03-29 DIAGNOSIS — I4821 Permanent atrial fibrillation: Secondary | ICD-10-CM

## 2020-03-29 DIAGNOSIS — E782 Mixed hyperlipidemia: Secondary | ICD-10-CM

## 2020-03-29 DIAGNOSIS — E1165 Type 2 diabetes mellitus with hyperglycemia: Secondary | ICD-10-CM | POA: Diagnosis not present

## 2020-03-29 MED ORDER — METOPROLOL TARTRATE 25 MG PO TABS: ORAL_TABLET | ORAL | 1 refills | Status: DC

## 2020-03-29 NOTE — Telephone Encounter (Signed)
Patient wants to speak with the nurse regarding the medications that she is taking.

## 2020-03-29 NOTE — Telephone Encounter (Signed)
New message       *STAT* If patient is at the pharmacy, call can be transferred to refill team.   1. Which medications need to be refilled? (please list name of each medication and dose if known)  metoprolol tartrate (LOPRESSOR) 100 MG tablet TAKE 1 TABLET TWICE A DAY WITH METOPROLOL 25 MG TO EQUAL 125 MG     2. Which pharmacy/location (including street and city if local pharmacy) is medication to be sent to? [facility] [address]   3. Do they need a 30 day or 90 day supply? [place]

## 2020-03-29 NOTE — Telephone Encounter (Signed)
Refilled lopressor 25 mg bid # 60 to [facility]

## 2020-03-29 NOTE — Telephone Encounter (Signed)
Attempt to reach, line busy-cc

## 2020-03-30 ENCOUNTER — Other Ambulatory Visit: Payer: Self-pay | Admitting: *Deleted

## 2020-03-30 MED ORDER — POTASSIUM CHLORIDE CRYS ER 20 MEQ PO TBCR
20.0000 meq | EXTENDED_RELEASE_TABLET | Freq: Every day | ORAL | 3 refills | Status: DC
Start: 2020-03-30 — End: 2020-05-11

## 2020-03-30 NOTE — Telephone Encounter (Signed)
Patient requested lopressor refill 25 mg which was filled yesterday

## 2020-04-02 ENCOUNTER — Ambulatory Visit (INDEPENDENT_AMBULATORY_CARE_PROVIDER_SITE_OTHER): Payer: Medicare Other | Admitting: Family Medicine

## 2020-04-02 ENCOUNTER — Other Ambulatory Visit: Payer: Self-pay

## 2020-04-02 ENCOUNTER — Encounter: Payer: Self-pay | Admitting: Family Medicine

## 2020-04-02 VITALS — BP 128/63 | HR 55 | Temp 97.0°F | Resp 20 | Ht 62.0 in | Wt 191.1 lb

## 2020-04-02 DIAGNOSIS — M545 Low back pain, unspecified: Secondary | ICD-10-CM | POA: Diagnosis not present

## 2020-04-02 DIAGNOSIS — I2510 Atherosclerotic heart disease of native coronary artery without angina pectoris: Secondary | ICD-10-CM | POA: Diagnosis not present

## 2020-04-02 DIAGNOSIS — E1142 Type 2 diabetes mellitus with diabetic polyneuropathy: Secondary | ICD-10-CM

## 2020-04-02 MED ORDER — CELECOXIB 200 MG PO CAPS: 200.0000 mg | ORAL_CAPSULE | Freq: Every day | ORAL | 5 refills | Status: DC

## 2020-04-02 MED ORDER — TIZANIDINE HCL 4 MG PO CAPS: 4.0000 mg | ORAL_CAPSULE | Freq: Three times a day (TID) | ORAL | 0 refills | Status: DC | PRN

## 2020-04-02 MED ORDER — KETOROLAC TROMETHAMINE 60 MG/2ML IM SOLN
60.0000 mg | Freq: Once | INTRAMUSCULAR | Status: AC
  Administered 2020-04-02: 60 mg via INTRAMUSCULAR

## 2020-04-02 NOTE — Progress Notes (Signed)
Subjective:  Patient ID: Amber Stephenson, female    DOB: 11-13-1945  Age: 74 y.o. MRN: [id number]  CC: Back Pain   HPI Amber Stephenson presents for left lower back pain.  She points to the L5 area laterally in the left region.  She states that she has diminished ability to ambulate due to the severity of the back pain.  She describes it as being sharp.  However she has no abdominal symptoms and no dysuria.  Onset was 3 days ago.  She describes the pain as being 10/10.  Of note is that she is followed by endocrinology and had an A1c of 10 3 days ago.  Depression screen [facility] 2/9 04/02/2020 03/26/2020 02/10/2020  Decreased Interest 0 1 0  Down, Depressed, Hopeless 0 1 0  PHQ - 2 Score 0 2 0  Altered sleeping - - -  Tired, decreased energy - - -  Change in appetite - - -  Feeling bad or failure about yourself  - - -  Trouble concentrating - - -  Moving slowly or fidgety/restless - - -  Suicidal thoughts - - -  PHQ-9 Score - - -  Difficult doing work/chores - - -  Some recent data might be hidden    History Amber Stephenson has a past medical history of Atrial fibrillation with RVR (05/19/2017), Bipolar I disorder (01/23/2015), CAD (coronary artery disease), Cardiomegaly (01/12/2018), Chronic anticoagulation (05/30/2015), Chronic back pain, Chronic diastolic CHF (congestive heart failure) (05/30/2015), Diabetes mellitus, type 2, without complication, Diabetic neuropathy (02/06/2016), Dysrhythmia, Essential hypertension, Herpes genitalis in women (07/16/2015), Hyperlipidemia, Hypertension, Hypoxia (02/01/2019), Insomnia (01/23/2015), Lipoma (02/08/2015), Major depressive disorder, Migraine headache with aura (02/12/2016), 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[place]) (04/21/2019), NAFL (nonalcoholic fatty liver) (9/67/8938), OSA (obstructive sleep apnea) (02/24/2019), Pulmonary hypertension, Renal insufficiency, and RLS (restless legs syndrome) (04/27/2015).   She has a past surgical history that includes THIGH  SURGERY; Shoulder surgery (Right); Breast reduction surgery; Eye surgery (Right); Hammer toe surgery; LEFT HEART CATH AND CORONARY ANGIOGRAPHY (N/A, 02/03/2018); and Reverse shoulder arthroplasty (Right, 08/19/2019).   Her family history includes Alcohol abuse in her sister; Alzheimer's disease in her father; Diabetes in her brother, mother, and sister; Heart disease in her brother, mother, and sister; Mental illness in her brother.She reports that she has never smoked. She has never used smokeless tobacco. She reports previous alcohol use. She reports that she does not use drugs.    ROS Review of Systems  Constitutional: Negative.   HENT: Negative.   Eyes: Negative for visual disturbance.  Respiratory: Negative for shortness of breath.   Cardiovascular: Negative for chest pain.  Gastrointestinal: Negative for abdominal pain.  Musculoskeletal: Negative for arthralgias.    Objective:  BP 128/63   Pulse (!) 55   Temp (!) 97 F (36.1 C) (Temporal)   Resp 20   Ht 5\' 2"  (1.575 m)   Wt 191 lb 2 oz (86.7 kg)   SpO2 93%   BMI 34.96 kg/m   BP Readings from Last 3 Encounters:  04/02/20 128/63  03/26/20 117/62  02/29/20 118/62    Wt Readings from Last 3 Encounters:  04/02/20 191 lb 2 oz (86.7 kg)  03/26/20 189 lb 3.2 oz (85.8 kg)  02/29/20 184 lb (83.5 kg)     Physical Exam Constitutional:      General: She is not in acute distress.    Appearance: She is well-developed.  Cardiovascular:     Rate and Rhythm: Normal rate and regular rhythm.  Pulmonary:  Breath sounds: Normal breath sounds.  Skin:    General: Skin is warm and dry.  Neurological:     Mental Status: She is alert and oriented to person, place, and time.       Assessment & Plan:   Amber Stephenson was seen today for back pain.  Diagnoses and all orders for this visit:  Low back pain, unspecified back pain laterality, unspecified chronicity, unspecified whether sciatica present -     ketorolac (TORADOL) injection  60 mg  Diabetic polyneuropathy associated with type 2 diabetes mellitus ([place])  Other orders -     tizanidine (ZANAFLEX) 4 MG capsule; Take 1 capsule (4 mg total) by mouth 3 (three) times daily as needed for muscle spasms. In the left lower back -     celecoxib (CELEBREX) 200 MG capsule; Take 1 capsule (200 mg total) by mouth daily. With food       I have discontinued Zurie Platas. Mcguirk's fluconazole and metaxalone. I am also having her start on tiZANidine and celecoxib. Additionally, I am having her maintain her Vitamin D3, insulin glargine, acetaminophen, atorvastatin, Semaglutide, Eliquis, albuterol, ProAir HFA, budesonide-formoterol, diltiazem, furosemide, nystatin, rizatriptan, topiramate, SUMAtriptan, triamcinolone ointment, metoprolol tartrate, escitalopram, ARIPiprazole, carbamazepine, LORazepam, pregabalin, flurazepam, meclizine, oxyCODONE-acetaminophen, Vascepa, metoprolol tartrate, and potassium chloride SA. We administered ketorolac.  Allergies as of 04/02/2020      Reactions   Ivp Dye [iodinated Diagnostic Agents] Anaphylaxis, Swelling   Throat closes   Latex    Latex tape pulls skin with it      Medication List       Accurate as of April 02, 2020  9:52 PM. If you have any questions, ask your nurse or doctor.        STOP taking these medications   fluconazole 150 MG tablet Commonly known as: DIFLUCAN Stopped by: [name], MD   metaxalone 800 MG tablet Commonly known as: Skelaxin Stopped by: [name], MD     TAKE these medications   acetaminophen 325 MG tablet Commonly known as: TYLENOL Take 325-650 mg by mouth every 6 (six) hours as needed for mild pain, moderate pain or headache.   albuterol (2.5 MG/3ML) 0.083% nebulizer solution Commonly known as: PROVENTIL Take 3 mLs (2.5 mg total) by nebulization every 6 (six) hours as needed for wheezing or shortness of breath.   ProAir HFA 108 (90 Base) MCG/ACT inhaler Generic drug: albuterol Inhale 1-2  puffs into the lungs every 6 (six) hours as needed. As needed   ARIPiprazole 10 MG tablet Commonly known as: ABILIFY Take 1 tablet (10 mg total) by mouth once for 1 dose.   atorvastatin 40 MG tablet Commonly known as: LIPITOR Take 1 tablet (40 mg total) by mouth daily.   budesonide-formoterol 80-4.5 MCG/ACT inhaler Commonly known as: Symbicort Inhale 2 puffs into the lungs 2 (two) times daily.   carbamazepine 100 MG 12 hr capsule Commonly known as: CARBATROL Take 1 capsule (100 mg total) by mouth daily.   celecoxib 200 MG capsule Commonly known as: CeleBREX Take 1 capsule (200 mg total) by mouth daily. With food Started by: [name], MD   diltiazem 120 MG 24 hr capsule Commonly known as: CARDIZEM CD TAKE 1 CAPSULE AT BEDTIME   Eliquis 5 MG Tabs tablet Generic drug: apixaban TAKE 1 TABLET TWICE A DAY What changed: how much to take   escitalopram 10 MG tablet Commonly known as: Lexapro Take 1 tablet (10 mg total) by mouth daily.   flurazepam 30 MG capsule Commonly  known as: DALMANE Take 1 capsule (30 mg total) by mouth at bedtime as needed for sleep.   furosemide 40 MG tablet Commonly known as: LASIX TAKE 1 TABLET EVERY MORNING  (DOSE INCREASED)   insulin glargine 100 UNIT/ML Solostar Pen Commonly known as: Lantus SoloStar INJECT 60 UNITS UNDER THE SKIN TWICE A DAY What changed:   how much to take  how to take this  when to take this  additional instructions   LORazepam 1 MG tablet Commonly known as: Ativan Take 1-2 tablets (1-2 mg total) by mouth See admin instructions. Take 1 tab (1 mg) in the morning and 2 tabs (2 mg ) at bedtime   meclizine 12.5 MG tablet Commonly known as: ANTIVERT Take 1 tablet (12.5 mg total) by mouth 3 (three) times daily as needed for dizziness.   metoprolol tartrate 100 MG tablet Commonly known as: LOPRESSOR TAKE 1 TABLET TWICE A DAY WITH METOPROLOL 25 MG TO EQUAL 125 MG   metoprolol tartrate 25 MG tablet Commonly  known as: LOPRESSOR TAKE 1 TABLET BY MOUTH TWICE DAILY(TAKEN TOGETHER WITH LOPRESSOR 100 MG TWICE DAILY)   nystatin powder Commonly known as: MYCOSTATIN/NYSTOP Apply topically 3 (three) times daily. x2 weeks (for groin/breast rash)   oxyCODONE-acetaminophen 5-325 MG tablet Commonly known as: PERCOCET/ROXICET Take 1 tablet by mouth every 6 (six) hours as needed. for pain   potassium chloride SA 20 MEQ tablet Commonly known as: KLOR-CON Take 1 tablet (20 mEq total) by mouth daily.   pregabalin 200 MG capsule Commonly known as: Lyrica Take 1 capsule (200 mg total) by mouth 3 (three) times daily.   rizatriptan 10 MG tablet Commonly known as: Maxalt Take 1 tablet at onset of migraine. Do not take more than 2-3 a week   Rybelsus 7 MG Tabs Generic drug: Semaglutide Take 7 mg by mouth daily.   SUMAtriptan 100 MG tablet Commonly known as: IMITREX TAKE 1 TABLET BY MOUTH IF NEEDED FOR MIGRAINE(MAY REPEAT IN 2 HOURS IF HEADACHE PERSISTS OR RECURS)   tiZANidine 4 MG capsule Commonly known as: ZANAFLEX Take 1 capsule (4 mg total) by mouth 3 (three) times daily as needed for muscle spasms. In the left lower back Started by: [name], MD   topiramate 25 MG tablet Commonly known as: TOPAMAX Take 2 tablets in AM, 3 tablets in PM   triamcinolone ointment 0.5 % Commonly known as: KENALOG Apply 1 application topically 2 (two) times daily as needed (skin irritation/rash.).   Vascepa 1 g capsule Generic drug: icosapent Ethyl TAKE 2 CAPSULES TWICE A DAY WITH MEALS   Vitamin D3 25 MCG (1000 UT) Caps Take 1,000 Units by mouth daily.        Follow-up: Return in about 6 weeks (around 05/14/2020), or if symptoms worsen or fail to improve.  [name], M.D.

## 2020-04-04 ENCOUNTER — Telehealth: Payer: Self-pay

## 2020-04-04 NOTE — Telephone Encounter (Signed)
Have her stop the celebrex, but continue the tizanidine

## 2020-04-04 NOTE — Telephone Encounter (Signed)
Patient aware and verbalized understanding. °

## 2020-04-04 NOTE — Telephone Encounter (Signed)
Patient states she started having diarrhea yesterday that is the same day she started Tizanidine and Celebrex she only had one pill of both from her visit on 11/01. She also had a Toradol injection this day. Patient states she has no abdomen pain or nausea or fever. Only symptom is the diarrhea. Patient asking what you think it could be?

## 2020-04-06 ENCOUNTER — Telehealth: Payer: Self-pay | Admitting: Family Medicine

## 2020-04-06 NOTE — Telephone Encounter (Signed)
Pt thinks that she has a bladder infection - burning and urinating more often than usual

## 2020-04-06 NOTE — Telephone Encounter (Signed)
Appt made for Monday. °

## 2020-04-09 ENCOUNTER — Ambulatory Visit (INDEPENDENT_AMBULATORY_CARE_PROVIDER_SITE_OTHER): Payer: Medicare Other | Admitting: Family Medicine

## 2020-04-09 ENCOUNTER — Encounter: Payer: Self-pay | Admitting: Family Medicine

## 2020-04-09 DIAGNOSIS — N3090 Cystitis, unspecified without hematuria: Secondary | ICD-10-CM

## 2020-04-09 MED ORDER — AMOXICILLIN 500 MG PO CAPS
500.0000 mg | ORAL_CAPSULE | Freq: Three times a day (TID) | ORAL | 0 refills | Status: DC
Start: 2020-04-09 — End: 2020-04-15

## 2020-04-09 NOTE — Progress Notes (Signed)
Subjective:    Patient ID: Amber Stephenson, female    DOB: 1946/04/10, 74 y.o.   MRN: [id number]   HPI: Amber Stephenson is a 74 y.o. female presenting for burning with urination and frequency for four days. Denies fever . Onset this AM of right flank pain. No nausea, vomiting. Has no transportation to come to the office    Depression screen [facility] 2/9 04/02/2020 03/26/2020 02/10/2020 12/23/2019 11/30/2019  Decreased Interest 0 1 0 0 0  Down, Depressed, Hopeless 0 1 0 0 0  PHQ - 2 Score 0 2 0 0 0  Altered sleeping - - - - -  Tired, decreased energy - - - - -  Change in appetite - - - - -  Feeling bad or failure about yourself  - - - - -  Trouble concentrating - - - - -  Moving slowly or fidgety/restless - - - - -  Suicidal thoughts - - - - -  PHQ-9 Score - - - - -  Difficult doing work/chores - - - - -  Some recent data might be hidden     Relevant past medical, surgical, family and social history reviewed and updated as indicated.  Interim medical history since our last visit reviewed. Allergies and medications reviewed and updated.  ROS:  Review of Systems  Constitutional: Negative for chills, diaphoresis and fever.  HENT: Negative for congestion.   Respiratory: Negative for cough and shortness of breath.   Cardiovascular: Negative for chest pain.  Gastrointestinal: Negative for constipation, diarrhea and nausea.  Genitourinary: Positive for dysuria, flank pain, frequency and urgency. Negative for decreased urine volume, hematuria, menstrual problem and pelvic pain.  Musculoskeletal: Negative for arthralgias and joint swelling.  Skin: Negative for rash.  Neurological: Negative for dizziness and numbness.     Social History   Tobacco Use  Smoking Status Never Smoker  Smokeless Tobacco Never Used       Objective:     Wt Readings from Last 3 Encounters:  04/02/20 191 lb 2 oz (86.7 kg)  03/26/20 189 lb 3.2 oz (85.8 kg)  02/29/20 184 lb (83.5 kg)     Exam  deferred. Pt. Harboring due to COVID 19. Phone visit performed.   Assessment & Plan:   1. Cystitis     Meds ordered this encounter  Medications  . amoxicillin (AMOXIL) 500 MG capsule    Sig: Take 1 capsule (500 mg total) by mouth 3 (three) times daily.    Dispense:  21 capsule    Refill:  0        Diagnoses and all orders for this visit:  Cystitis  Other orders -     amoxicillin (AMOXIL) 500 MG capsule; Take 1 capsule (500 mg total) by mouth 3 (three) times daily.    Virtual Visit via telephone Note  I discussed the limitations, risks, security and privacy concerns of performing an evaluation and management service by telephone and the availability of in person appointments. The patient was identified with two identifiers. Pt.expressed understanding and agreed to proceed. Pt. Is at home. Dr. Livia Snellen is in his office.  Follow Up Instructions:   I discussed the assessment and treatment plan with the patient. The patient was provided an opportunity to ask questions and all were answered. The patient agreed with the plan and demonstrated an understanding of the instructions.   The patient was advised to call back or seek an in-person evaluation if the symptoms worsen or if  the condition fails to improve as anticipated.   Total minutes including chart review and phone contact time: 17   Follow up plan: No follow-ups on file.  [name], MD [place]

## 2020-04-15 ENCOUNTER — Encounter (HOSPITAL_COMMUNITY): Payer: Self-pay | Admitting: Emergency Medicine

## 2020-04-15 ENCOUNTER — Other Ambulatory Visit: Payer: Self-pay

## 2020-04-15 ENCOUNTER — Ambulatory Visit (HOSPITAL_COMMUNITY)
Admission: EM | Admit: 2020-04-15 | Discharge: 2020-04-15 | Disposition: A | Discharge status: Home / Self Care | Payer: Medicare Other | Attending: Emergency Medicine | Admitting: Emergency Medicine

## 2020-04-15 DIAGNOSIS — M545 Low back pain, unspecified: Secondary | ICD-10-CM

## 2020-04-15 DIAGNOSIS — G8929 Other chronic pain: Secondary | ICD-10-CM

## 2020-04-15 DIAGNOSIS — F3130 Bipolar disorder, current episode depressed, mild or moderate severity, unspecified: Secondary | ICD-10-CM

## 2020-04-15 MED ORDER — DEXAMETHASONE SODIUM PHOSPHATE 10 MG/ML IJ SOLN
INTRAMUSCULAR | Status: AC
  Filled 2020-04-15: qty 1

## 2020-04-15 MED ORDER — PREGABALIN 200 MG PO CAPS: 200.0000 mg | ORAL_CAPSULE | Freq: Three times a day (TID) | ORAL | 0 refills | Status: DC

## 2020-04-15 MED ORDER — DICLOFENAC SODIUM 1 % EX GEL: 2.0000 g | Freq: Four times a day (QID) | CUTANEOUS | 0 refills | Status: DC

## 2020-04-15 MED ORDER — DEXAMETHASONE SODIUM PHOSPHATE 10 MG/ML IJ SOLN
10.0000 mg | Freq: Once | INTRAMUSCULAR | Status: AC
  Administered 2020-04-15: 10 mg via INTRAMUSCULAR

## 2020-04-15 MED ORDER — PREGABALIN 200 MG PO CAPS: 200.0000 mg | ORAL_CAPSULE | Freq: Two times a day (BID) | ORAL | 0 refills | Status: DC

## 2020-04-15 NOTE — ED Provider Notes (Signed)
[facility]    CSN: [id number] Arrival date & time: 04/15/20  1104      History   Chief Complaint Chief Complaint  Patient presents with  . Back Pain    HPI Amber Stephenson is a 74 y.o. female history of A. fib, CAD, CHF, DM type II, hypertension, presenting today for evaluation of back pain.  Patient has chronic back pain, but pain is worse than normal.  Located in right lower area.  Denies any injury increase in activity or heavy lifting.  She reports she has been lying in the bed recently, occasionally getting up to walk around the house.  She denies any numbness or tingling.  Denies any difficulty controlling urination/bowels.  Is currently on antibiotic for treating UTI.  Goes to physical medicine and rehab, occasionally gets trigger point injections which she reports are not helpful for her.  She reports she has previously gotten injections intramuscular which have helped, but she cannot recall what this is.  Patient is on Eliquis.  Last A1c approximately 1 month ago was 10 according to PCP note.  Has oxycodone prescribed to her chronically, last prescription 03/26/2020 and was given 30-day supply.  HPI  Past Medical History:  Diagnosis Date  . Atrial fibrillation with RVR 05/19/2017  . Bipolar I disorder 01/23/2015  . CAD (coronary artery disease)    Nonobstructive; Managed by Dr. Bronson Ing  . Cardiomegaly 01/12/2018  . Chronic anticoagulation 05/30/2015  . Chronic back pain    Lower back  . Chronic diastolic CHF (congestive heart failure) 05/30/2015  . Diabetes mellitus, type 2, without complication   . Diabetic neuropathy 02/06/2016  . Dysrhythmia    A-Fib  . Essential hypertension   . Herpes genitalis in women 07/16/2015  . Hyperlipidemia   . Hypertension   . Hypoxia 02/01/2019  . Insomnia 01/23/2015  . Lipoma 02/08/2015  . Major depressive disorder   . Migraine headache with aura 02/12/2016  . 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[place]) 04/21/2019  .  NAFL (nonalcoholic fatty liver) 9/67/8938  . OSA (obstructive sleep apnea) 02/24/2019   10/09/2018 - HST  - AHI 40.6   . Pulmonary hypertension   . Renal insufficiency    Managed by Dr. Wallace Keller  . RLS (restless legs syndrome) 04/27/2015    Patient Active Problem List   Diagnosis Date Noted  . Chronic pain syndrome 08/22/2019  . Nerve pain 08/22/2019  . Myofascial pain dysfunction syndrome 08/22/2019  . Dyspnea 08/22/2019  . CAD (coronary artery disease)   . Hypocalcemia   . S/P shoulder replacement, 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  . History of adenomatous polyp of colon 05/21/2019  . Polypharmacy 05/21/2019  . Benign paroxysmal positional vertigo due to bilateral vestibular disorder 05/20/2019  . Hyperlipidemia associated with type 2 diabetes mellitus ([place]) 05/20/2019  . Vertigo 04/25/2019  . 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[place]) 04/21/2019  . OSA (obstructive sleep apnea) 02/24/2019  . Allergic rhinitis 02/24/2019  . Acute on chronic respiratory failure with hypoxia ([place]) 01/22/2019  . 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 Morbid obesity ([place]) 07/06/2018  . Cardiomegaly 01/12/2018  . Non-alcoholic fatty liver disease 01/12/2018  . Mild intermittent asthma 01/12/2018  . Senile osteoporosis 12/15/2017  . Asthma 11/12/2017  . Fibromyalgia 03/19/2017  . Migraine headache with aura 02/12/2016  . Diabetic neuropathy associated with type 2 diabetes mellitus ([place]) 02/06/2016  . Bipolar 1 disorder, manic, moderate ([place]) 10/04/2015  . CHF (congestive heart failure) ([place]) 10/04/2015  . Depression, recurrent ([place]) 10/03/2015  . Herpes  genitalis in women 07/16/2015  . Long term current use of anticoagulant 05/30/2015  . Family history of coronary arteriosclerosis 05/30/2015  . Chronic diastolic heart failure ([place]) 05/30/2015  . Dyspnea on exertion   . Hypertension associated with diabetes ([place])   . Restless legs 04/27/2015  . Lipoma 02/08/2015  . Bipolar disorder ([place]) 01/23/2015  .  Insomnia 01/23/2015  . Chronic back pain 01/04/2015  . Chronic atrial fibrillation ([place]) 01/04/2015  . DM2 (diabetes mellitus, type 2) ([place])   . Hyperlipidemia     Past Surgical History:  Procedure Laterality Date  . BREAST REDUCTION SURGERY    . EYE SURGERY Right    cateracts  . HAMMER TOE SURGERY    . LEFT HEART CATH AND CORONARY ANGIOGRAPHY N/A 02/03/2018   Procedure: LEFT HEART CATH AND CORONARY ANGIOGRAPHY;  Surgeon: Martinique, Peter M, MD;  Location: [facility];  Service: Cardiovascular;  Laterality: N/A;  . REVERSE SHOULDER ARTHROPLASTY Right 08/19/2019   Procedure: REVERSE SHOULDER ARTHROPLASTY;  Surgeon: Netta Cedars, MD;  Location: [facility];  Service: Orthopedics;  Laterality: Right;  interscalene block  . SHOULDER SURGERY Right    "I BROKE MY SHOUDLER  . THIGH SURGERY     "TO REMOVE A TUMOR "    OB History   No obstetric history on file.      Home Medications    Prior to Admission medications   Medication Sig Start Date End Date Taking? Authorizing Provider  atorvastatin (LIPITOR) 40 MG tablet Take 1 tablet (40 mg total) by mouth daily. 05/20/19  Yes Rakes, Connye Burkitt, FNP  carbamazepine (CARBATROL) 100 MG 12 hr capsule Take 1 capsule (100 mg total) by mouth daily. 02/22/20 02/21/21 Yes Plovsky, Berneta Sages, MD  Cholecalciferol (VITAMIN D3) 1000 UNITS CAPS Take 1,000 Units by mouth daily.   Yes [provider]  diltiazem (CARDIZEM CD) 120 MG 24 hr capsule TAKE 1 CAPSULE AT BEDTIME 09/19/19  Yes Herminio Commons, MD  ELIQUIS 5 MG TABS tablet TAKE 1 TABLET TWICE A DAY Patient taking differently: Take 5 mg by mouth 2 (two) times daily.  08/01/19  Yes Rakes, Connye Burkitt, FNP  Insulin Glargine (LANTUS SOLOSTAR) 100 UNIT/ML Solostar Pen INJECT 60 UNITS UNDER THE SKIN TWICE A DAY Patient taking differently: Inject 50 Units into the skin 2 (two) times daily.  01/25/18  Yes Herminio Commons, MD  meclizine (ANTIVERT) 12.5 MG tablet Take 1 tablet (12.5 mg total) by mouth 3  (three) times daily as needed for dizziness. 02/29/20  Yes Strader, [place], PA-C  metoprolol tartrate (LOPRESSOR) 25 MG tablet TAKE 1 TABLET BY MOUTH TWICE DAILY(TAKEN TOGETHER WITH LOPRESSOR 100 MG TWICE DAILY) 03/29/20  Yes Strader, [place] M, PA-C  oxyCODONE-acetaminophen (PERCOCET/ROXICET) 5-325 MG tablet Take 1 tablet by mouth every 6 (six) hours as needed. for pain 03/26/20  Yes Lovorn, Megan, MD  Semaglutide (RYBELSUS) 7 MG TABS Take 7 mg by mouth daily.  06/17/19  Yes [provider]  VASCEPA 1 g capsule TAKE 2 CAPSULES TWICE A DAY WITH MEALS 03/29/20  Yes Hawks, Christy A, FNP  acetaminophen (TYLENOL) 325 MG tablet Take 325-650 mg by mouth every 6 (six) hours as needed for mild pain, moderate pain or headache.     [provider]  albuterol (PROVENTIL) (2.5 MG/3ML) 0.083% nebulizer solution Take 3 mLs (2.5 mg total) by nebulization every 6 (six) hours as needed for wheezing or shortness of breath. 08/29/19   Rakes, Connye Burkitt, FNP  ARIPiprazole (ABILIFY) 10  MG tablet Take 1 tablet (10 mg total) by mouth once for 1 dose. 02/22/20 04/02/20  Plovsky, Berneta Sages, MD  budesonide-formoterol (SYMBICORT) 80-4.5 MCG/ACT inhaler Inhale 2 puffs into the lungs 2 (two) times daily. 08/29/19   Baruch Gouty, FNP  diclofenac Sodium (VOLTAREN) 1 % GEL Apply 2 g topically 4 (four) times daily. 04/15/20   Daivik Overley C, PA-C  escitalopram (LEXAPRO) 10 MG tablet Take 1 tablet (10 mg total) by mouth daily. 02/16/20 02/15/21  Plovsky, Berneta Sages, MD  flurazepam (DALMANE) 30 MG capsule Take 1 capsule (30 mg total) by mouth at bedtime as needed for sleep. 02/22/20   Norma Fredrickson, MD  furosemide (LASIX) 40 MG tablet TAKE 1 TABLET EVERY MORNING  (DOSE INCREASED) 10/12/19   Herminio Commons, MD  LORazepam (ATIVAN) 1 MG tablet Take 1-2 tablets (1-2 mg total) by mouth See admin instructions. Take 1 tab (1 mg) in the morning and 2 tabs (2 mg ) at bedtime 02/22/20   Plovsky, Berneta Sages, MD  nystatin  (MYCOSTATIN/NYSTOP) powder Apply topically 3 (three) times daily. x2 weeks (for groin/breast rash) 10/17/19   [name], MD  potassium chloride SA (KLOR-CON) 20 MEQ tablet Take 1 tablet (20 mEq total) by mouth daily. 03/30/20   [name], MD  pregabalin (LYRICA) 200 MG capsule Take 1 capsule (200 mg total) by mouth 2 (two) times daily. 04/15/20 05/15/20  Shenia Alan C, PA-C  PROAIR HFA 108 (90 Base) MCG/ACT inhaler Inhale 1-2 puffs into the lungs every 6 (six) hours as needed. As needed 08/29/19   Baruch Gouty, FNP  rizatriptan (MAXALT) 10 MG tablet Take 1 tablet at onset of migraine. Do not take more than 2-3 a week 11/15/19   Cameron Sprang, MD  SUMAtriptan (IMITREX) 100 MG tablet TAKE 1 TABLET BY MOUTH IF NEEDED FOR MIGRAINE(MAY REPEAT IN 2 HOURS IF HEADACHE PERSISTS OR RECURS) 11/24/19   [name], MD  tizanidine (ZANAFLEX) 4 MG capsule Take 1 capsule (4 mg total) by mouth 3 (three) times daily as needed for muscle spasms. In the left lower back 04/02/20   [name], MD  topiramate (TOPAMAX) 25 MG tablet Take 2 tablets in AM, 3 tablets in PM 11/08/19   Cameron Sprang, MD  triamcinolone ointment (KENALOG) 0.5 % Apply 1 application topically 2 (two) times daily as needed (skin irritation/rash.). 11/29/19   [name], MD    Family History Family History  Problem Relation Age of Onset  . Diabetes Mother   . Heart disease Mother   . Heart disease Brother   . Heart disease Sister        CABG  . Diabetes Sister   . Alzheimer's disease Father   . Alcohol abuse Sister   . Mental illness Brother   . Diabetes Brother     Social History Social History   Tobacco Use  . Smoking status: Never Smoker  . Smokeless tobacco: Never Used  Vaping Use  . Vaping Use: Never used  Substance Use Topics  . Alcohol use: Not Currently    Alcohol/week: 0.0 standard drinks  . Drug use: No     Allergies   Ivp dye [iodinated diagnostic agents] and Latex   Review of  Systems Review of Systems  Constitutional: Negative for fatigue and fever.  Eyes: Negative for visual disturbance.  Respiratory: Negative for shortness of breath.   Cardiovascular: Negative for chest pain.  Gastrointestinal: Negative for abdominal pain, nausea and vomiting.  Musculoskeletal: Positive for back pain and  myalgias. Negative for arthralgias and joint swelling.  Skin: Negative for color change, rash and wound.  Neurological: Negative for dizziness, weakness, light-headedness and headaches.     Physical Exam Triage Vital Signs ED Triage Vitals  Enc Vitals Group     BP      Pulse      Resp      Temp      Temp src      SpO2      Weight      Height      Head Circumference      Peak Flow      Pain Score      Pain Loc      Pain Edu?      Excl. in [place]?    No data found.  Updated Vital Signs BP 128/76 (BP Location: Left Arm)   Pulse 61   Temp 98.3 F (36.8 C) (Oral)   Resp (!) 24   SpO2 95%   Visual Acuity Right Eye Distance:   Left Eye Distance:   Bilateral Distance:    Right Eye Near:   Left Eye Near:    Bilateral Near:     Physical Exam Vitals and nursing note reviewed.  Constitutional:      Appearance: She is well-developed.     Comments: Sitting in wheelchair, No acute distress  HENT:     Head: Normocephalic and atraumatic.     Nose: Nose normal.  Eyes:     Conjunctiva/sclera: Conjunctivae normal.  Cardiovascular:     Rate and Rhythm: Normal rate.  Pulmonary:     Effort: Pulmonary effort is normal. No respiratory distress.     Comments: Breathing comfortably at rest, CTABL, no wheezing, rales or other adventitious sounds auscultated Abdominal:     General: There is no distension.  Musculoskeletal:        General: Normal range of motion.     Cervical back: Neck supple.     Comments: Tender to palpation of lower lumbar spine midline, increased tenderness throughout right lumbar musculature  Strength at hips and knees 5/5 and equal  bilaterally, patellar reflex 2+ bilaterally  Skin:    General: Skin is warm and dry.  Neurological:     Mental Status: She is alert and oriented to person, place, and time.      UC Treatments / Results  Labs (all labs ordered are listed, but only abnormal results are displayed) Labs Reviewed - No data to display  EKG   Radiology No results found.  Procedures Procedures (including critical care time)  Medications Ordered in UC Medications  dexamethasone (DECADRON) injection 10 mg (10 mg Intramuscular Given 04/15/20 1349)    Initial Impression / Assessment and Plan / UC Course  I have reviewed the triage vital signs and the nursing notes.  Pertinent labs & imaging results that were available during my care of the patient were reviewed by me and considered in my medical decision making (see chart for details).     Acute on chronic back pain, no new mechanism of injury, cannot take NSAIDs due to history of renal insufficiency and currently on Eliquis, last A1c 10, reports sugars around 200 recently.  Will refill Lyrica as she has been out of this for approximately 2 weeks.  Voltaren topically and Decadron 10 mg prior to discharge.  May continue with over-the-counter lidocaine patches and oxycodone which she has prescribed chronically.  Due to patient's medical history very limited in medicines that can  safely be prescribed to her to help with pain.   Discussed strict return precautions. Patient verbalized understanding and is agreeable with plan.  Final Clinical Impressions(s) / UC Diagnoses   Final diagnoses:  Chronic right-sided low back pain without sciatica     Discharge Instructions     We gave you an injection of Decadron Monitor blood sugars and take diabetes medicines as prescribed Continue with oxycodone prescribed May try over the counter lidocaine patches Diclofenac cream to back 4 times daily  Lyrica refilled    ED Prescriptions    Medication Sig  Dispense Auth. Provider   pregabalin (LYRICA) 200 MG capsule  (Status: Discontinued) Take 1 capsule (200 mg total) by mouth 3 (three) times daily. 90 capsule Josie [place] C, PA-C   diclofenac Sodium (VOLTAREN) 1 % GEL Apply 2 g topically 4 (four) times daily. 150 g [place] Urieta C, PA-C   pregabalin (LYRICA) 200 MG capsule Take 1 capsule (200 mg total) by mouth 2 (two) times daily. 60 capsule Dorien Bessent, [place] C, PA-C     PDMP not reviewed this encounter.   Janith Lima, PA-C 04/15/20 1414

## 2020-04-15 NOTE — Discharge Instructions (Signed)
We gave you an injection of Decadron Monitor blood sugars and take diabetes medicines as prescribed Continue with oxycodone prescribed May try over the counter lidocaine patches Diclofenac cream to back 4 times daily  Lyrica refilled

## 2020-04-15 NOTE — ED Triage Notes (Addendum)
History of back pain and issues.  Pain has worsened recently.  No new injuries.  Complains of lower back, all the way across lower back.  Slight pain into left buttocks and left thigh.  No issues controlling bladder or bowel   Has run out of lyrica

## 2020-04-17 ENCOUNTER — Telehealth: Payer: Self-pay

## 2020-04-17 NOTE — Telephone Encounter (Signed)
Spoke with patient, appointment scheduled with Dr. Livia Snellen on 04/19/2020 at 8:10 am

## 2020-04-19 ENCOUNTER — Ambulatory Visit (INDEPENDENT_AMBULATORY_CARE_PROVIDER_SITE_OTHER): Payer: Medicare Other | Admitting: Family Medicine

## 2020-04-19 ENCOUNTER — Encounter: Payer: Self-pay | Admitting: Family Medicine

## 2020-04-19 ENCOUNTER — Other Ambulatory Visit: Payer: Self-pay

## 2020-04-19 ENCOUNTER — Telehealth: Payer: Self-pay

## 2020-04-19 VITALS — BP 128/91 | HR 72 | Temp 97.0°F | Ht 62.0 in | Wt 183.2 lb

## 2020-04-19 DIAGNOSIS — E1142 Type 2 diabetes mellitus with diabetic polyneuropathy: Secondary | ICD-10-CM

## 2020-04-19 DIAGNOSIS — I2510 Atherosclerotic heart disease of native coronary artery without angina pectoris: Secondary | ICD-10-CM | POA: Diagnosis not present

## 2020-04-19 DIAGNOSIS — M461 Sacroiliitis, not elsewhere classified: Secondary | ICD-10-CM | POA: Diagnosis not present

## 2020-04-19 MED ORDER — FLUCONAZOLE 150 MG PO TABS: 150.0000 mg | ORAL_TABLET | Freq: Once | ORAL | 0 refills | Status: DC

## 2020-04-19 MED ORDER — PREDNISONE 10 MG PO TABS: ORAL_TABLET | ORAL | 0 refills | Status: DC

## 2020-04-19 NOTE — Telephone Encounter (Signed)
I sent in fluconazole to her [facility] in [place].  That should help.  She should be done with the antibiotic by now.  If she has any leftover she should discontinue it and dispose of it

## 2020-04-19 NOTE — Progress Notes (Signed)
Chief Complaint  Patient presents with  . Follow-up    urgent care    HPI  Patient presents today for right lower back pain. This has led to decreased ability to walk. She went to urgent care for the pain 3 days ago. She was given a steroid injection IM. She has been getting them epidural as well. Unfortunately this one only gave her relief for 1-2 days.  PMH: Smoking status noted ROS: Per HPI  Objective: BP (!) 128/91   Pulse 72   Temp (!) 97 F (36.1 C) (Temporal)   Ht 5\' 2"  (1.575 m)   Wt 183 lb 3.2 oz (83.1 kg)   BMI 33.51 kg/m  Gen: NAD, alert, cooperative with exam HEENT: NCAT, EOMI, PERRL CV: RRR, good S1/S2, no murmur Resp: CTABL, no wheezes, non-labored Abd: SNTND, BS present, no guarding or organomegaly Ext: No edema, warm Neuro: Alert and oriented, No gross deficits  Assessment and plan:  1. Sacroiliitis ([place])   2. Diabetic polyneuropathy associated with type 2 diabetes mellitus ([place])     Meds ordered this encounter  Medications  . predniSONE (DELTASONE) 10 MG tablet    Sig: Take 5 daily for 3 days followed by 4,3,2 and 1 for 3 days each.    Dispense:  45 tablet    Refill:  0      Follow up 1 month  [name], MD

## 2020-04-19 NOTE — Patient Instructions (Signed)
Back Exercises The following exercises strengthen the muscles that help to support the trunk and back. They also help to keep the lower back flexible. Doing these exercises can help to prevent back pain or lessen existing pain.  If you have back pain or discomfort, try doing these exercises 2-3 times each day or as told by your health care provider.  As your pain improves, do them once each day, but increase the number of times that you repeat the steps for each exercise (do more repetitions).  To prevent the recurrence of back pain, continue to do these exercises once each day or as told by your health care provider. Do exercises exactly as told by your health care provider and adjust them as directed. It is normal to feel mild stretching, pulling, tightness, or discomfort as you do these exercises, but you should stop right away if you feel sudden pain or your pain gets worse. Exercises Single knee to chest Repeat these steps 3-5 times for each leg: 1. Lie on your back on a firm bed or the floor with your legs extended. 2. Bring one knee to your chest. Your other leg should stay extended and in contact with the floor. 3. Hold your knee in place by grabbing your knee or thigh with both hands and hold. 4. Pull on your knee until you feel a gentle stretch in your lower back or buttocks. 5. Hold the stretch for 10-30 seconds. 6. Slowly release and straighten your leg. Pelvic tilt Repeat these steps 5-10 times: 1. Lie on your back on a firm bed or the floor with your legs extended. 2. Bend your knees so they are pointing toward the ceiling and your feet are flat on the floor. 3. Tighten your lower abdominal muscles to press your lower back against the floor. This motion will tilt your pelvis so your tailbone points up toward the ceiling instead of pointing to your feet or the floor. 4. With gentle tension and even breathing, hold this position for 5-10 seconds. Cat-cow Repeat these steps until  your lower back becomes more flexible: 1. Get into a hands-and-knees position on a firm surface. Keep your hands under your shoulders, and keep your knees under your hips. You may place padding under your knees for comfort. 2. Let your head hang down toward your chest. Contract your abdominal muscles and point your tailbone toward the floor so your lower back becomes rounded like the back of a cat. 3. Hold this position for 5 seconds. 4. Slowly lift your head, let your abdominal muscles relax and point your tailbone up toward the ceiling so your back forms a sagging arch like the back of a cow. 5. Hold this position for 5 seconds.  Press-ups Repeat these steps 5-10 times: 1. Lie on your abdomen (face-down) on the floor. 2. Place your palms near your head, about shoulder-width apart. 3. Keeping your back as relaxed as possible and keeping your hips on the floor, slowly straighten your arms to raise the top half of your body and lift your shoulders. Do not use your back muscles to raise your upper torso. You may adjust the placement of your hands to make yourself more comfortable. 4. Hold this position for 5 seconds while you keep your back relaxed. 5. Slowly return to lying flat on the floor.  Bridges Repeat these steps 10 times: 1. Lie on your back on a firm surface. 2. Bend your knees so they are pointing toward the ceiling and   your feet are flat on the floor. Your arms should be flat at your sides, next to your body. 3. Tighten your buttocks muscles and lift your buttocks off the floor until your waist is at almost the same height as your knees. You should feel the muscles working in your buttocks and the back of your thighs. If you do not feel these muscles, slide your feet 1-2 inches farther away from your buttocks. 4. Hold this position for 3-5 seconds. 5. Slowly lower your hips to the starting position, and allow your buttocks muscles to relax completely. If this exercise is too easy, try  doing it with your arms crossed over your chest. Abdominal crunches Repeat these steps 5-10 times: 1. Lie on your back on a firm bed or the floor with your legs extended. 2. Bend your knees so they are pointing toward the ceiling and your feet are flat on the floor. 3. Cross your arms over your chest. 4. Tip your chin slightly toward your chest without bending your neck. 5. Tighten your abdominal muscles and slowly raise your trunk (torso) high enough to lift your shoulder blades a tiny bit off the floor. Avoid raising your torso higher than that because it can put too much stress on your low back and does not help to strengthen your abdominal muscles. 6. Slowly return to your starting position. Back lifts Repeat these steps 5-10 times: 1. Lie on your abdomen (face-down) with your arms at your sides, and rest your forehead on the floor. 2. Tighten the muscles in your legs and your buttocks. 3. Slowly lift your chest off the floor while you keep your hips pressed to the floor. Keep the back of your head in line with the curve in your back. Your eyes should be looking at the floor. 4. Hold this position for 3-5 seconds. 5. Slowly return to your starting position. Contact a health care provider if:  Your back pain or discomfort gets much worse when you do an exercise.  Your worsening back pain or discomfort does not lessen within 2 hours after you exercise. If you have any of these problems, stop doing these exercises right away. Do not do them again unless your health care provider says that you can. Get help right away if:  You develop sudden, severe back pain. If this happens, stop doing the exercises right away. Do not do them again unless your health care provider says that you can. This information is not intended to replace advice given to you by your health care provider. Make sure you discuss any questions you have with your health care provider. Document Revised: 09/23/2018 Document  Reviewed: 02/18/2018 Elsevier Patient Education  2020 [employer]  

## 2020-04-19 NOTE — Telephone Encounter (Signed)
Patient aware and verbalizes understanding. 

## 2020-04-22 ENCOUNTER — Encounter: Payer: Self-pay | Admitting: Family Medicine

## 2020-04-23 ENCOUNTER — Encounter (HOSPITAL_COMMUNITY): Payer: Self-pay

## 2020-04-23 ENCOUNTER — Other Ambulatory Visit: Payer: Self-pay

## 2020-04-23 ENCOUNTER — Emergency Department (HOSPITAL_COMMUNITY): Payer: Medicare Other

## 2020-04-23 ENCOUNTER — Observation Stay (HOSPITAL_COMMUNITY)
Admission: EM | Admit: 2020-04-23 | Discharge: 2020-04-24 | Disposition: A | Discharge status: Home / Self Care | Payer: Medicare Other | Attending: Family Medicine | Admitting: Family Medicine

## 2020-04-23 DIAGNOSIS — R262 Difficulty in walking, not elsewhere classified: Secondary | ICD-10-CM

## 2020-04-23 DIAGNOSIS — M549 Dorsalgia, unspecified: Secondary | ICD-10-CM | POA: Diagnosis present

## 2020-04-23 DIAGNOSIS — I5032 Chronic diastolic (congestive) heart failure: Secondary | ICD-10-CM | POA: Diagnosis not present

## 2020-04-23 DIAGNOSIS — Z794 Long term (current) use of insulin: Secondary | ICD-10-CM | POA: Diagnosis not present

## 2020-04-23 DIAGNOSIS — M47816 Spondylosis without myelopathy or radiculopathy, lumbar region: Secondary | ICD-10-CM | POA: Diagnosis not present

## 2020-04-23 DIAGNOSIS — Z79899 Other long term (current) drug therapy: Secondary | ICD-10-CM | POA: Insufficient documentation

## 2020-04-23 DIAGNOSIS — Z7901 Long term (current) use of anticoagulants: Secondary | ICD-10-CM | POA: Diagnosis not present

## 2020-04-23 DIAGNOSIS — R270 Ataxia, unspecified: Secondary | ICD-10-CM | POA: Diagnosis not present

## 2020-04-23 DIAGNOSIS — R42 Dizziness and giddiness: Secondary | ICD-10-CM | POA: Diagnosis not present

## 2020-04-23 DIAGNOSIS — R531 Weakness: Secondary | ICD-10-CM | POA: Diagnosis not present

## 2020-04-23 DIAGNOSIS — I10 Essential (primary) hypertension: Secondary | ICD-10-CM | POA: Diagnosis present

## 2020-04-23 DIAGNOSIS — Z20822 Contact with and (suspected) exposure to covid-19: Secondary | ICD-10-CM | POA: Insufficient documentation

## 2020-04-23 DIAGNOSIS — E1165 Type 2 diabetes mellitus with hyperglycemia: Secondary | ICD-10-CM | POA: Diagnosis not present

## 2020-04-23 DIAGNOSIS — M50221 Other cervical disc displacement at C4-C5 level: Secondary | ICD-10-CM | POA: Diagnosis not present

## 2020-04-23 DIAGNOSIS — M2578 Osteophyte, vertebrae: Secondary | ICD-10-CM | POA: Diagnosis not present

## 2020-04-23 DIAGNOSIS — I2510 Atherosclerotic heart disease of native coronary artery without angina pectoris: Secondary | ICD-10-CM | POA: Diagnosis present

## 2020-04-23 DIAGNOSIS — G894 Chronic pain syndrome: Secondary | ICD-10-CM | POA: Diagnosis present

## 2020-04-23 DIAGNOSIS — W19XXXA Unspecified fall, initial encounter: Secondary | ICD-10-CM

## 2020-04-23 DIAGNOSIS — Z91040 Latex allergy status: Secondary | ICD-10-CM | POA: Insufficient documentation

## 2020-04-23 DIAGNOSIS — F319 Bipolar disorder, unspecified: Secondary | ICD-10-CM | POA: Diagnosis present

## 2020-04-23 DIAGNOSIS — E1169 Type 2 diabetes mellitus with other specified complication: Secondary | ICD-10-CM

## 2020-04-23 DIAGNOSIS — I110 Hypertensive heart disease with heart failure: Secondary | ICD-10-CM | POA: Diagnosis not present

## 2020-04-23 DIAGNOSIS — M545 Low back pain, unspecified: Secondary | ICD-10-CM | POA: Diagnosis not present

## 2020-04-23 DIAGNOSIS — G4733 Obstructive sleep apnea (adult) (pediatric): Secondary | ICD-10-CM | POA: Diagnosis present

## 2020-04-23 DIAGNOSIS — R222 Localized swelling, mass and lump, trunk: Secondary | ICD-10-CM | POA: Diagnosis not present

## 2020-04-23 DIAGNOSIS — E1159 Type 2 diabetes mellitus with other circulatory complications: Secondary | ICD-10-CM | POA: Diagnosis present

## 2020-04-23 DIAGNOSIS — E119 Type 2 diabetes mellitus without complications: Secondary | ICD-10-CM | POA: Diagnosis not present

## 2020-04-23 DIAGNOSIS — M5124 Other intervertebral disc displacement, thoracic region: Secondary | ICD-10-CM | POA: Diagnosis not present

## 2020-04-23 DIAGNOSIS — M47814 Spondylosis without myelopathy or radiculopathy, thoracic region: Secondary | ICD-10-CM | POA: Diagnosis not present

## 2020-04-23 DIAGNOSIS — I482 Chronic atrial fibrillation, unspecified: Secondary | ICD-10-CM | POA: Diagnosis present

## 2020-04-23 DIAGNOSIS — M50222 Other cervical disc displacement at C5-C6 level: Secondary | ICD-10-CM | POA: Diagnosis not present

## 2020-04-23 DIAGNOSIS — I152 Hypertension secondary to endocrine disorders: Secondary | ICD-10-CM | POA: Diagnosis present

## 2020-04-23 DIAGNOSIS — M47812 Spondylosis without myelopathy or radiculopathy, cervical region: Secondary | ICD-10-CM | POA: Diagnosis not present

## 2020-04-23 DIAGNOSIS — Y92009 Unspecified place in unspecified non-institutional (private) residence as the place of occurrence of the external cause: Secondary | ICD-10-CM

## 2020-04-23 HISTORY — DX: Dorsalgia, unspecified: M54.9

## 2020-04-23 HISTORY — DX: Difficulty in walking, not elsewhere classified: R26.2

## 2020-04-23 LAB — COMPREHENSIVE METABOLIC PANEL
ALT: 17 U/L (ref 0–44)
AST: 15 U/L (ref 15–41)
Albumin: 3.9 g/dL (ref 3.5–5.0)
Alkaline Phosphatase: 157 U/L — ABNORMAL HIGH (ref 38–126)
Anion gap: 12 (ref 5–15)
BUN: 35 mg/dL — ABNORMAL HIGH (ref 8–23)
CO2: 25 mmol/L (ref 22–32)
Calcium: 9.4 mg/dL (ref 8.9–10.3)
Chloride: 93 mmol/L — ABNORMAL LOW (ref 98–111)
Creatinine, Ser: 1.09 mg/dL — ABNORMAL HIGH (ref 0.44–1.00)
GFR, Estimated: 54 mL/min — ABNORMAL LOW (ref >60)
Glucose, Bld: 612 mg/dL (ref 70–99)
Potassium: 4.3 mmol/L (ref 3.5–5.1)
Sodium: 130 mmol/L — ABNORMAL LOW (ref 135–145)
Total Bilirubin: 0.7 mg/dL (ref 0.3–1.2)
Total Protein: 7.1 g/dL (ref 6.5–8.1)

## 2020-04-23 LAB — CBC WITH DIFFERENTIAL/PLATELET
Abs Immature Granulocytes: 0.16 10*3/uL — ABNORMAL HIGH (ref 0.00–0.07)
Basophils Absolute: 0 10*3/uL (ref 0.0–0.1)
Basophils Relative: 0 %
Eosinophils Absolute: 0 10*3/uL (ref 0.0–0.5)
Eosinophils Relative: 0 %
HCT: 41.8 % (ref 36.0–46.0)
Hemoglobin: 14.3 g/dL (ref 12.0–15.0)
Immature Granulocytes: 1 %
Lymphocytes Relative: 27 %
Lymphs Abs: 3.3 10*3/uL (ref 0.7–4.0)
MCH: 31 pg (ref 26.0–34.0)
MCHC: 34.2 g/dL (ref 30.0–36.0)
MCV: 90.5 fL (ref 80.0–100.0)
Monocytes Absolute: 0.6 10*3/uL (ref 0.1–1.0)
Monocytes Relative: 5 %
Neutro Abs: 8.2 10*3/uL — ABNORMAL HIGH (ref 1.7–7.7)
Neutrophils Relative %: 67 %
Platelets: 211 10*3/uL (ref 150–400)
RBC: 4.62 MIL/uL (ref 3.87–5.11)
RDW: 11.8 % (ref 11.5–15.5)
WBC: 12.2 10*3/uL — ABNORMAL HIGH (ref 4.0–10.5)
nRBC: 0 % (ref 0.0–0.2)

## 2020-04-23 LAB — CBC
HCT: 41.9 % (ref 36.0–46.0)
Hemoglobin: 14.5 g/dL (ref 12.0–15.0)
MCH: 31.2 pg (ref 26.0–34.0)
MCHC: 34.6 g/dL (ref 30.0–36.0)
MCV: 90.1 fL (ref 80.0–100.0)
Platelets: 218 10*3/uL (ref 150–400)
RBC: 4.65 MIL/uL (ref 3.87–5.11)
RDW: 11.8 % (ref 11.5–15.5)
WBC: 12.3 10*3/uL — ABNORMAL HIGH (ref 4.0–10.5)
nRBC: 0 % (ref 0.0–0.2)

## 2020-04-23 LAB — RESP PANEL BY RT-PCR (FLU A&B, COVID) ARPGX2
Influenza A by PCR: NEGATIVE
Influenza B by PCR: NEGATIVE
SARS Coronavirus 2 by RT PCR: NEGATIVE

## 2020-04-23 LAB — HEMOGLOBIN A1C
Hgb A1c MFr Bld: 10.9 % — ABNORMAL HIGH (ref 4.8–5.6)
Mean Plasma Glucose: 266.13 mg/dL

## 2020-04-23 LAB — CBG MONITORING, ED
Glucose-Capillary: 412 mg/dL — ABNORMAL HIGH (ref 70–99)
Glucose-Capillary: 324 mg/dL — ABNORMAL HIGH (ref 70–99)
Glucose-Capillary: 262 mg/dL — ABNORMAL HIGH (ref 70–99)
Glucose-Capillary: 206 mg/dL — ABNORMAL HIGH (ref 70–99)

## 2020-04-23 LAB — GLUCOSE, CAPILLARY: Glucose-Capillary: 270 mg/dL — ABNORMAL HIGH (ref 70–99)

## 2020-04-23 MED ORDER — SODIUM CHLORIDE 0.9% FLUSH
3.0000 mL | Freq: Two times a day (BID) | INTRAVENOUS | Status: DC
  Administered 2020-04-23: 3 mL via INTRAVENOUS

## 2020-04-23 MED ORDER — INSULIN ASPART 100 UNIT/ML ~~LOC~~ SOLN
3.0000 [IU] | Freq: Three times a day (TID) | SUBCUTANEOUS | Status: DC
  Administered 2020-04-24 (×2): 3 [IU] via SUBCUTANEOUS

## 2020-04-23 MED ORDER — DILTIAZEM HCL ER COATED BEADS 120 MG PO CP24
120.0000 mg | ORAL_CAPSULE | Freq: Every day | ORAL | Status: DC
  Administered 2020-04-23: 120 mg via ORAL
  Filled 2020-04-23 (×3): qty 1

## 2020-04-23 MED ORDER — APIXABAN 5 MG PO TABS
5.0000 mg | ORAL_TABLET | Freq: Two times a day (BID) | ORAL | Status: DC
  Administered 2020-04-23 – 2020-04-24 (×2): 5 mg via ORAL
  Filled 2020-04-23 (×2): qty 1

## 2020-04-23 MED ORDER — ADULT MULTIVITAMIN W/MINERALS CH
1.0000 | ORAL_TABLET | Freq: Every day | ORAL | Status: DC
  Administered 2020-04-23 – 2020-04-24 (×2): 1 via ORAL
  Filled 2020-04-23 (×2): qty 1

## 2020-04-23 MED ORDER — ONDANSETRON HCL 4 MG PO TABS: 4.0000 mg | ORAL_TABLET | Freq: Four times a day (QID) | ORAL | Status: DC | PRN

## 2020-04-23 MED ORDER — ATORVASTATIN CALCIUM 40 MG PO TABS
40.0000 mg | ORAL_TABLET | Freq: Every day | ORAL | Status: DC
  Administered 2020-04-23 – 2020-04-24 (×2): 40 mg via ORAL
  Filled 2020-04-23 (×3): qty 1

## 2020-04-23 MED ORDER — DEXTROSE IN LACTATED RINGERS 5 % IV SOLN: INTRAVENOUS | Status: DC

## 2020-04-23 MED ORDER — TOPIRAMATE 25 MG PO TABS
50.0000 mg | ORAL_TABLET | Freq: Every day | ORAL | Status: DC
  Administered 2020-04-24: 50 mg via ORAL
  Filled 2020-04-23: qty 2

## 2020-04-23 MED ORDER — OMEGA-3-ACID ETHYL ESTERS 1 G PO CAPS
2.0000 g | ORAL_CAPSULE | Freq: Two times a day (BID) | ORAL | Status: DC
  Administered 2020-04-23 – 2020-04-24 (×2): 2 g via ORAL
  Filled 2020-04-23 (×6): qty 2

## 2020-04-23 MED ORDER — VITAMIN D 25 MCG (1000 UNIT) PO TABS
1000.0000 [IU] | ORAL_TABLET | Freq: Every day | ORAL | Status: DC
  Administered 2020-04-24: 1000 [IU] via ORAL
  Filled 2020-04-23: qty 1

## 2020-04-23 MED ORDER — ACETAMINOPHEN 325 MG PO TABS: 650.0000 mg | ORAL_TABLET | Freq: Four times a day (QID) | ORAL | Status: DC | PRN

## 2020-04-23 MED ORDER — SODIUM CHLORIDE 0.9 % IV SOLN: 250.0000 mL | INTRAVENOUS | Status: DC | PRN

## 2020-04-23 MED ORDER — DEXTROSE 50 % IV SOLN: 0.0000 mL | INTRAVENOUS | Status: DC | PRN

## 2020-04-23 MED ORDER — MORPHINE SULFATE (PF) 2 MG/ML IV SOLN: 2.0000 mg | INTRAVENOUS | Status: DC | PRN

## 2020-04-23 MED ORDER — MOMETASONE FURO-FORMOTEROL FUM 100-5 MCG/ACT IN AERO
2.0000 | INHALATION_SPRAY | Freq: Two times a day (BID) | RESPIRATORY_TRACT | Status: DC
  Administered 2020-04-24: 2 via RESPIRATORY_TRACT
  Filled 2020-04-23: qty 8.8

## 2020-04-23 MED ORDER — SODIUM CHLORIDE 0.9% FLUSH: 3.0000 mL | INTRAVENOUS | Status: DC | PRN

## 2020-04-23 MED ORDER — CARBAMAZEPINE ER 100 MG PO TB12
100.0000 mg | ORAL_TABLET | Freq: Every day | ORAL | Status: DC
  Administered 2020-04-23 – 2020-04-24 (×2): 100 mg via ORAL
  Filled 2020-04-23 (×5): qty 1

## 2020-04-23 MED ORDER — SUMATRIPTAN SUCCINATE 50 MG PO TABS
50.0000 mg | ORAL_TABLET | Freq: Two times a day (BID) | ORAL | Status: DC | PRN
  Filled 2020-04-23: qty 1

## 2020-04-23 MED ORDER — INSULIN GLARGINE 100 UNIT/ML ~~LOC~~ SOLN
35.0000 [IU] | Freq: Two times a day (BID) | SUBCUTANEOUS | Status: DC
  Filled 2020-04-23 (×2): qty 0.35

## 2020-04-23 MED ORDER — ONDANSETRON HCL 4 MG/2ML IJ SOLN: 4.0000 mg | Freq: Four times a day (QID) | INTRAMUSCULAR | Status: DC | PRN

## 2020-04-23 MED ORDER — PREGABALIN 75 MG PO CAPS
200.0000 mg | ORAL_CAPSULE | Freq: Two times a day (BID) | ORAL | Status: DC
  Administered 2020-04-23 – 2020-04-24 (×2): 200 mg via ORAL
  Filled 2020-04-23 (×2): qty 1

## 2020-04-23 MED ORDER — SODIUM CHLORIDE 0.9 % IV BOLUS
500.0000 mL | Freq: Once | INTRAVENOUS | Status: AC
  Administered 2020-04-23: 500 mL via INTRAVENOUS

## 2020-04-23 MED ORDER — ACETAMINOPHEN 650 MG RE SUPP: 650.0000 mg | Freq: Four times a day (QID) | RECTAL | Status: DC | PRN

## 2020-04-23 MED ORDER — BISACODYL 10 MG RE SUPP: 10.0000 mg | Freq: Every day | RECTAL | Status: DC | PRN

## 2020-04-23 MED ORDER — SODIUM CHLORIDE 0.9 % IV SOLN: INTRAVENOUS | Status: DC

## 2020-04-23 MED ORDER — INSULIN REGULAR(HUMAN) IN NACL 100-0.9 UT/100ML-% IV SOLN
INTRAVENOUS | Status: DC
  Administered 2020-04-23: 7.5 [IU]/h via INTRAVENOUS
  Administered 2020-04-23: 4.2 [IU]/h via INTRAVENOUS
  Filled 2020-04-23: qty 100

## 2020-04-23 MED ORDER — OXYCODONE-ACETAMINOPHEN 5-325 MG PO TABS
1.0000 | ORAL_TABLET | Freq: Four times a day (QID) | ORAL | Status: DC | PRN
  Administered 2020-04-24: 1 via ORAL
  Filled 2020-04-23: qty 1

## 2020-04-23 MED ORDER — TOPIRAMATE 25 MG PO TABS
75.0000 mg | ORAL_TABLET | Freq: Every day | ORAL | Status: DC
  Administered 2020-04-23: 75 mg via ORAL
  Filled 2020-04-23: qty 3

## 2020-04-23 MED ORDER — METOPROLOL TARTRATE 50 MG PO TABS
50.0000 mg | ORAL_TABLET | Freq: Two times a day (BID) | ORAL | Status: DC
  Administered 2020-04-23 – 2020-04-24 (×2): 50 mg via ORAL
  Filled 2020-04-23 (×2): qty 1

## 2020-04-23 MED ORDER — SODIUM CHLORIDE 0.9% FLUSH: 3.0000 mL | Freq: Two times a day (BID) | INTRAVENOUS | Status: DC

## 2020-04-23 MED ORDER — INSULIN ASPART 100 UNIT/ML ~~LOC~~ SOLN
0.0000 [IU] | Freq: Three times a day (TID) | SUBCUTANEOUS | Status: DC
  Administered 2020-04-24: 2 [IU] via SUBCUTANEOUS
  Administered 2020-04-24: 11 [IU] via SUBCUTANEOUS

## 2020-04-23 MED ORDER — LACTATED RINGERS IV SOLN: INTRAVENOUS | Status: DC

## 2020-04-23 MED ORDER — TRAZODONE HCL 50 MG PO TABS: 50.0000 mg | ORAL_TABLET | Freq: Every evening | ORAL | Status: DC | PRN

## 2020-04-23 MED ORDER — INSULIN ASPART 100 UNIT/ML ~~LOC~~ SOLN
0.0000 [IU] | Freq: Every day | SUBCUTANEOUS | Status: DC
  Administered 2020-04-23: 3 [IU] via SUBCUTANEOUS

## 2020-04-23 MED ORDER — POLYETHYLENE GLYCOL 3350 17 G PO PACK
17.0000 g | PACK | Freq: Every day | ORAL | Status: DC
  Administered 2020-04-23 – 2020-04-24 (×2): 17 g via ORAL
  Filled 2020-04-23 (×2): qty 1

## 2020-04-23 MED ORDER — INSULIN GLARGINE 100 UNIT/ML ~~LOC~~ SOLN
35.0000 [IU] | Freq: Two times a day (BID) | SUBCUTANEOUS | Status: DC
  Administered 2020-04-23: 35 [IU] via SUBCUTANEOUS
  Filled 2020-04-23 (×4): qty 0.35

## 2020-04-23 MED ORDER — LORAZEPAM 1 MG PO TABS: 1.0000 mg | ORAL_TABLET | Freq: Every evening | ORAL | Status: DC | PRN

## 2020-04-23 MED ORDER — ALBUTEROL SULFATE (2.5 MG/3ML) 0.083% IN NEBU: 3.0000 mL | INHALATION_SOLUTION | Freq: Four times a day (QID) | RESPIRATORY_TRACT | Status: DC | PRN

## 2020-04-23 MED ORDER — GADOBUTROL 1 MMOL/ML IV SOLN
10.0000 mL | Freq: Once | INTRAVENOUS | Status: AC | PRN
  Administered 2020-04-23: 10 mL via INTRAVENOUS

## 2020-04-23 NOTE — Assessment & Plan Note (Signed)
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

## 2020-04-23 NOTE — ED Provider Notes (Addendum)
[facility] EMERGENCY DEPARTMENT Provider Note   CSN: [id number] Arrival date & time: 04/23/20  0350     History Chief Complaint  Patient presents with  . Extremity Weakness    Amber Stephenson is a 74 y.o. female.  HPI    74 yo female history of anticoagulation, afib, dm, chronic low back pain presents stating she has dizziness and can't walk for 5 days.  States seen by pmd last Monday for same and started on prednisone.  Unable to reconcile stated start date of 5 days ago and seeing md for same 7 days ago- patient state she can't remember.  She does report that the symptoms came on suddenly and she fell.  She fell again yesterday and hit head.  She has pain to back of head.  No change in sensation, no reported loss of bowel or bladder control but reports urinating more. PMD Dr. Livia Snellen Past Medical History:  Diagnosis Date  . Atrial fibrillation with RVR 05/19/2017  . Bipolar I disorder 01/23/2015  . CAD (coronary artery disease)    Nonobstructive; Managed by Dr. Bronson Ing  . Cardiomegaly 01/12/2018  . Chronic anticoagulation 05/30/2015  . Chronic back pain    Lower back  . Chronic diastolic CHF (congestive heart failure) 05/30/2015  . Diabetes mellitus, type 2, without complication   . Diabetic neuropathy 02/06/2016  . Dysrhythmia    A-Fib  . Essential hypertension   . Herpes genitalis in women 07/16/2015  . Hyperlipidemia   . Hypertension   . Hypoxia 02/01/2019  . Insomnia 01/23/2015  . Lipoma 02/08/2015  . Major depressive disorder   . Migraine headache with aura 02/12/2016  . 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[place]) 04/21/2019  . NAFL (nonalcoholic fatty liver) 0/93/8182  . OSA (obstructive sleep apnea) 02/24/2019   10/09/2018 - HST  - AHI 40.6   . Pulmonary hypertension   . Renal insufficiency    Managed by Dr. Wallace Keller  . RLS (restless legs syndrome) 04/27/2015    Patient Active Problem List   Diagnosis Date Noted  . Chronic pain syndrome 08/22/2019  . Nerve  pain 08/22/2019  . Myofascial pain dysfunction syndrome 08/22/2019  . Dyspnea 08/22/2019  . CAD (coronary artery disease)   . Hypocalcemia   . S/P shoulder replacement, 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  . History of adenomatous polyp of colon 05/21/2019  . Polypharmacy 05/21/2019  . Benign paroxysmal positional vertigo due to bilateral vestibular disorder 05/20/2019  . Hyperlipidemia associated with type 2 diabetes mellitus ([place]) 05/20/2019  . Vertigo 04/25/2019  . 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[place]) 04/21/2019  . OSA (obstructive sleep apnea) 02/24/2019  . Allergic rhinitis 02/24/2019  . Acute on chronic respiratory failure with hypoxia ([place]) 01/22/2019  . 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 Morbid obesity ([place]) 07/06/2018  . Cardiomegaly 01/12/2018  . Non-alcoholic fatty liver disease 01/12/2018  . Mild intermittent asthma 01/12/2018  . Senile osteoporosis 12/15/2017  . Asthma 11/12/2017  . Fibromyalgia 03/19/2017  . Migraine headache with aura 02/12/2016  . Diabetic neuropathy associated with type 2 diabetes mellitus ([place]) 02/06/2016  . Bipolar 1 disorder, manic, moderate ([place]) 10/04/2015  . CHF (congestive heart failure) ([place]) 10/04/2015  . Depression, recurrent ([place]) 10/03/2015  . Herpes genitalis in women 07/16/2015  . Long term current use of anticoagulant 05/30/2015  . Family history of coronary arteriosclerosis 05/30/2015  . Chronic diastolic heart failure ([place]) 05/30/2015  . Dyspnea on exertion   . Hypertension associated with diabetes ([place])   . Restless legs 04/27/2015  .  Lipoma 02/08/2015  . Bipolar disorder ([place]) 01/23/2015  . Insomnia 01/23/2015  . Chronic back pain 01/04/2015  . Chronic atrial fibrillation ([place]) 01/04/2015  . DM2 (diabetes mellitus, type 2) ([place])   . Hyperlipidemia     Past Surgical History:  Procedure Laterality Date  . BREAST REDUCTION SURGERY    . EYE SURGERY Right    cateracts  . HAMMER TOE SURGERY    . LEFT HEART CATH AND CORONARY  ANGIOGRAPHY N/A 02/03/2018   Procedure: LEFT HEART CATH AND CORONARY ANGIOGRAPHY;  Surgeon: Martinique, Peter M, MD;  Location: [facility];  Service: Cardiovascular;  Laterality: N/A;  . REVERSE SHOULDER ARTHROPLASTY Right 08/19/2019   Procedure: REVERSE SHOULDER ARTHROPLASTY;  Surgeon: Netta Cedars, MD;  Location: [facility];  Service: Orthopedics;  Laterality: Right;  interscalene block  . SHOULDER SURGERY Right    "I BROKE MY SHOUDLER  . THIGH SURGERY     "TO REMOVE A TUMOR "     OB History   No obstetric history on file.     Family History  Problem Relation Age of Onset  . Diabetes Mother   . Heart disease Mother   . Heart disease Brother   . Heart disease Sister        CABG  . Diabetes Sister   . Alzheimer's disease Father   . Alcohol abuse Sister   . Mental illness Brother   . Diabetes Brother     Social History   Tobacco Use  . Smoking status: Never Smoker  . Smokeless tobacco: Never Used  Vaping Use  . Vaping Use: Never used  Substance Use Topics  . Alcohol use: Not Currently    Alcohol/week: 0.0 standard drinks  . Drug use: No    Home Medications Prior to Admission medications   Medication Sig Start Date End Date Taking? Authorizing Provider  acetaminophen (TYLENOL) 325 MG tablet Take 325-650 mg by mouth every 6 (six) hours as needed for mild pain, moderate pain or headache.     [provider]  albuterol (PROVENTIL) (2.5 MG/3ML) 0.083% nebulizer solution Take 3 mLs (2.5 mg total) by nebulization every 6 (six) hours as needed for wheezing or shortness of breath. 08/29/19   Rakes, Connye [place], FNP  ARIPiprazole (ABILIFY) 10 MG tablet Take 1 tablet (10 mg total) by mouth once for 1 dose. 02/22/20 04/19/20  Plovsky, Berneta Sages, MD  atorvastatin (LIPITOR) 40 MG tablet Take 1 tablet (40 mg total) by mouth daily. 05/20/19   Baruch Gouty, FNP  budesonide-formoterol (SYMBICORT) 80-4.5 MCG/ACT inhaler Inhale 2 puffs into the lungs 2 (two) times daily. 08/29/19   Baruch Gouty, FNP  carbamazepine (CARBATROL) 100 MG 12 hr capsule Take 1 capsule (100 mg total) by mouth daily. 02/22/20 02/21/21  Plovsky, Berneta Sages, MD  Cholecalciferol (VITAMIN D3) 1000 UNITS CAPS Take 1,000 Units by mouth daily.    [provider]  diclofenac Sodium (VOLTAREN) 1 % GEL Apply 2 g topically 4 (four) times daily. 04/15/20   Wieters, Hallie C, PA-C  diltiazem (CARDIZEM CD) 120 MG 24 hr capsule TAKE 1 CAPSULE AT BEDTIME 09/19/19   Herminio Commons, MD  ELIQUIS 5 MG TABS tablet TAKE 1 TABLET TWICE A DAY Patient taking differently: Take 5 mg by mouth 2 (two) times daily.  08/01/19   Baruch Gouty, FNP  escitalopram (LEXAPRO) 10 MG tablet Take 1 tablet (10 mg total) by mouth daily. 02/16/20 02/15/21  Plovsky, Berneta Sages, MD  flurazepam (DALMANE) 30 MG capsule  Take 1 capsule (30 mg total) by mouth at bedtime as needed for sleep. 02/22/20   Norma Fredrickson, MD  furosemide (LASIX) 40 MG tablet TAKE 1 TABLET EVERY MORNING  (DOSE INCREASED) 10/12/19   Herminio Commons, MD  Insulin Glargine (LANTUS SOLOSTAR) 100 UNIT/ML Solostar Pen INJECT 60 UNITS UNDER THE SKIN TWICE A DAY Patient taking differently: Inject 50 Units into the skin 2 (two) times daily.  01/25/18   Herminio Commons, MD  LORazepam (ATIVAN) 1 MG tablet Take 1-2 tablets (1-2 mg total) by mouth See admin instructions. Take 1 tab (1 mg) in the morning and 2 tabs (2 mg ) at bedtime 02/22/20   Plovsky, Berneta Sages, MD  meclizine (ANTIVERT) 12.5 MG tablet Take 1 tablet (12.5 mg total) by mouth 3 (three) times daily as needed for dizziness. 02/29/20   Strader, Fransisco Hertz, PA-C  metoprolol tartrate (LOPRESSOR) 25 MG tablet TAKE 1 TABLET BY MOUTH TWICE DAILY(TAKEN TOGETHER WITH LOPRESSOR 100 MG TWICE DAILY) 03/29/20   Strader, [place] M, PA-C  nystatin (MYCOSTATIN/NYSTOP) powder Apply topically 3 (three) times daily. x2 weeks (for groin/breast rash) 10/17/19   [name], MD  oxyCODONE-acetaminophen (PERCOCET/ROXICET) 5-325 MG tablet Take 1  tablet by mouth every 6 (six) hours as needed. for pain 03/26/20   Lovorn, Jinny Blossom, MD  potassium chloride SA (KLOR-CON) 20 MEQ tablet Take 1 tablet (20 mEq total) by mouth daily. 03/30/20   [name], MD  predniSONE (DELTASONE) 10 MG tablet Take 5 daily for 3 days followed by 4,3,2 and 1 for 3 days each. 04/19/20   [name], MD  pregabalin (LYRICA) 200 MG capsule Take 1 capsule (200 mg total) by mouth 2 (two) times daily. 04/15/20 05/15/20  Wieters, Hallie C, PA-C  PROAIR HFA 108 (90 Base) MCG/ACT inhaler Inhale 1-2 puffs into the lungs every 6 (six) hours as needed. As needed 08/29/19   Baruch Gouty, FNP  rizatriptan (MAXALT) 10 MG tablet Take 1 tablet at onset of migraine. Do not take more than 2-3 a week 11/15/19   Cameron Sprang, MD  Semaglutide (RYBELSUS) 7 MG TABS Take 7 mg by mouth daily.  06/17/19   [provider]  SUMAtriptan (IMITREX) 100 MG tablet TAKE 1 TABLET BY MOUTH IF NEEDED FOR MIGRAINE(MAY REPEAT IN 2 HOURS IF HEADACHE PERSISTS OR RECURS) 11/24/19   [name], MD  tizanidine (ZANAFLEX) 4 MG capsule Take 1 capsule (4 mg total) by mouth 3 (three) times daily as needed for muscle spasms. In the left lower back 04/02/20   [name], MD  topiramate (TOPAMAX) 25 MG tablet Take 2 tablets in AM, 3 tablets in PM 11/08/19   Cameron Sprang, MD  triamcinolone ointment (KENALOG) 0.5 % Apply 1 application topically 2 (two) times daily as needed (skin irritation/rash.). 11/29/19   [name], MD  VASCEPA 1 g capsule TAKE 2 CAPSULES TWICE A DAY WITH MEALS 03/29/20   Evelina Dun A, FNP    Allergies    Ivp dye [iodinated diagnostic agents] and Latex  Review of Systems   Review of Systems  Physical Exam Updated Vital Signs BP 121/88   Pulse 76   Temp (!) 97.1 F (36.2 C) (Oral)   Resp 18   Ht 1.575 m (5\' 2" )   Wt 81.6 kg   SpO2 96%   BMI 32.92 kg/m   Physical Exam Vitals and nursing note reviewed.  Constitutional:      Appearance: Normal appearance.   HENT:     Head:  Normocephalic.     Right Ear: External ear normal.     Left Ear: External ear normal.     Nose: Nose normal.     Mouth/Throat:     Pharynx: Oropharynx is clear.  Eyes:     Extraocular Movements: Extraocular movements intact.     Pupils: Pupils are equal, round, and reactive to light.  Cardiovascular:     Rate and Rhythm: Normal rate and regular rhythm.     Pulses: Normal pulses.     Heart sounds: Normal heart sounds.  Pulmonary:     Effort: Pulmonary effort is normal.     Breath sounds: Normal breath sounds.  Abdominal:     General: Abdomen is flat.     Palpations: Abdomen is soft.  Musculoskeletal:        General: Normal range of motion.     Cervical back: Normal range of motion.  Skin:    General: Skin is warm.     Capillary Refill: Capillary refill takes less than 2 seconds.  Neurological:     General: No focal deficit present.     Mental Status: She is alert.  Psychiatric:        Mood and Affect: Mood normal.     ED Results / Procedures / Treatments   Labs (all labs ordered are listed, but only abnormal results are displayed) Labs Reviewed  RESP PANEL BY RT-PCR (FLU A&B, COVID) [id number]  CBC  COMPREHENSIVE METABOLIC PANEL  URINALYSIS, ROUTINE W REFLEX MICROSCOPIC    EKG None  Radiology CT Head Wo Contrast  Result Date: 04/23/2020 CLINICAL DATA:  Dizziness after fall EXAM: CT HEAD WITHOUT CONTRAST TECHNIQUE: Contiguous axial images were obtained from the base of the skull through the vertex without intravenous contrast. COMPARISON:  August 10, 2017. FINDINGS: Brain: Ventricles and sulci appear within normal limits for age. There is no intracranial mass, hemorrhage, extra-axial fluid collection, or midline shift. Minimal small vessel disease in the centra semiovale is stable. No acute infarct evident. Vascular: No hyperdense vessel. Calcification noted in the carotid siphon regions. Skull: Bony calvarium appears intact. Sinuses/Orbits: There is  mucosal thickening in several ethmoid air cells. Other visualized paranasal sinuses are clear. Orbits appear symmetric bilaterally. Other: Mastoid air cells are clear. IMPRESSION: Minimal periventricular small vessel disease. No acute infarct. No mass or hemorrhage. Foci of arterial vascular calcification noted. Mucosal thickening noted in several ethmoid air cells. Electronically Signed   By: Lowella Grip III M.D.   On: 04/23/2020 10:59   MR BRAIN WO CONTRAST  Result Date: 04/23/2020 CLINICAL DATA:  Dizziness. EXAM: MRI HEAD WITHOUT CONTRAST TECHNIQUE: Multiplanar, multiecho pulse sequences of the brain and surrounding structures were obtained without intravenous contrast. COMPARISON:  CT head April 23, 2020. FINDINGS: Brain: No acute infarction, hemorrhage, hydrocephalus, extra-axial collection or mass lesion. Scattered T2/FLAIR hyperintensities within the white matter, compatible with chronic microvascular ischemic disease and appropriate for patient's age. Mild diffuse cerebral atrophy. Vascular: Major arterial flow voids are maintained at the skull base. Skull and upper cervical spine: Normal marrow signal. Sinuses/Orbits: Mild ethmoid air cell mucosal thickening. Remaining sinuses are clear. Unremarkable orbits. Other: No mastoid effusions. IMPRESSION: No evidence of acute intracranial abnormality. Specifically, no acute infarct. Electronically Signed   By: Margaretha Sheffield MD   On: 04/23/2020 13:33   MR LUMBAR SPINE WO CONTRAST  Result Date: 04/23/2020 CLINICAL DATA:  Low back pain. Unable to walk. Bilateral leg weakness. EXAM: MRI LUMBAR SPINE WITHOUT CONTRAST TECHNIQUE: Multiplanar, multisequence MR imaging  of the lumbar spine was performed. No intravenous contrast was administered. COMPARISON:  MRI lumbar spine 03/27/2019. FINDINGS: Segmentation:  Standard. Alignment:  Physiologic. Vertebrae: No evidence of acute fracture, discitis/osteomyelitis, or suspicious bone lesion. Degenerative  Schmorl's nodes. Conus medullaris and cauda equina: Conus extends to the L1-L2 level. Conus appears normal. Approximately 5 mm round mass within the dorsal spinal canal at L2 (see series 14, image 8 and series 17, image 8). Paraspinal and other soft tissues: Unremarkable. Disc levels: T12-L1: No significant disc protrusion, foraminal stenosis, or canal stenosis. L1-L2: Small broad-based disc bulge with tiny right paracentral disc protrusion, similar to prior. Mild bilateral facet hypertrophy. No significant canal or foraminal stenosis. L2-L3: Similar mild broad-based disc bulge with moderate right and mild left facet hypertrophy. No significant canal or foraminal stenosis. L3-L4: Similar right eccentric broad-based disc bulge with moderate right and mild left facet hypertrophy. No significant canal or foraminal stenosis. Mild right subarticular recess stenosis. L4-L5: Similar broad-based disc bulge with small broad left foraminal disc protrusion and moderate bilateral facet hypertrophy. Mild left foraminal and left subarticular recess stenosis. No significant canal stenosis. L5-S1: No significant disc bulge. Similar bilateral facet hypertrophy. No evidence of significant canal or foraminal stenosis. IMPRESSION: 1. 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 2. Similar multilevel degenerative change without significant central canal stenosis. Similar mild left foraminal stenosis at L4-L5. Electronically Signed   By: Margaretha Sheffield MD   On: 04/23/2020 13:28    Procedures Procedures (including critical care time)  Medications Ordered in ED Medications - No data to display  ED Course  I have reviewed the triage vital signs and the nursing notes.  Pertinent labs & imaging results that were available during  my care of the patient were reviewed by me and considered in my medical decision making (see chart for details).    MDM Rules/Calculators/A&P                         Reviewed MRI and increased lesion at L2 noted.  Discussed with Dr. Ellene Route, on-call for neurosurgery.  He has reviewed the films and thinks it is very unlikely that this lesion may be causing the patient any symptoms.  Therefore, is not think that a structural lesion is the etiology.  Plan consultation with neurology. Discussed with Dr. Merlene Laughter and he will see in consult. Advised mri cervical and thoracic spine Plan admission with generalized ataxia- neurology to consult, mris pending Hyperglycemia- known diabetes, likely steroid induced. No evidence of dka, insulin infusing. Discussed with Dr. Denton Brick and he will see for possible admission.   Thoracic and cervical mri pending- if negative may be candidate for SNF placement. Husband at bedside reports a more gradual onset of symptoms.  Final Clinical Impression(s) / ED Diagnoses Final diagnoses:  Weakness  Ataxia    Rx / DC Orders ED Discharge Orders    None       Pattricia Boss, MD 04/23/20 1529    Pattricia Boss, MD 04/23/20 [phone]

## 2020-04-23 NOTE — TOC Initial Note (Signed)
Transition of Care [facility]) - Initial/Assessment Note   Patient Details  Name: Amber Stephenson MRN: [id number] Date of Birth: 11-10-1945  Transition of Care [facility]) CM/SW Contact:    Sherie Don, LCSW Phone Number: 04/23/2020, 3:42 PM  Clinical Narrative: Patient is a 74 year old female who is under observation for back pain. TOC consulted for SNF placement. CSW spoke with patient's husband, Nieve Rojero. Per husband, patient has been having frequent falls for the past 2-3 weeks. Patient does not have any DME or [place] services at this time. Husband reported he provides extensive assistance with patient's ADLs. Patient is able to afford her medications monthly, which husband reported she takes as prescribed. FL2 started; PASRR pending as patient will be a level II. TOC awaiting PT evaluation.  Expected Discharge Plan: Skilled Nursing Facility Barriers to Discharge: Continued Medical Work up, ED Awaiting State Approval [facility])  Patient Goals and CMS Choice Patient states their goals for this hospitalization and ongoing recovery are:: Get stronger CMS Medicare.gov Compare Post Acute Care list provided to:: Patient Represenative (must comment) Choice offered to / list presented to : Spouse  Expected Discharge Plan and Services Expected Discharge Plan: [place] In-house Referral: Clinical Social Work Discharge Planning Services: NA Post Acute Care Choice: [place] Living arrangements for the past 2 months: Single Family Home             DME Arranged: N/A DME Agency: NA HH Arranged: NA HH Agency: NA  Prior Living Arrangements/Services Living arrangements for the past 2 months: [place] Lives with:: Spouse Patient language and need for interpreter reviewed:: Yes Do you feel safe going back to the place where you live?: Yes      Need for Family Participation in Patient Care: Yes (Comment) Care giver support system in place?: Yes (comment) Criminal  Activity/Legal Involvement Pertinent to Current Situation/Hospitalization: No - Comment as needed  Permission Sought/Granted Permission sought to share information with : Chartered certified accountant granted to share information with : Yes, Verbal Permission Granted Permission granted to share info w AGENCY: SNFs  Emotional Assessment Appearance:: Appears stated age Orientation: : Oriented to Self, Oriented to Place, Oriented to  Time, Oriented to Situation Alcohol / Substance Use: Not Applicable Psych Involvement: No (comment)  Admission diagnosis:  Back pain [M54.9] Patient Active Problem List   Diagnosis Date Noted  . Back pain 04/23/2020  . Chronic pain syndrome 08/22/2019  . Nerve pain 08/22/2019  . Myofascial pain dysfunction syndrome 08/22/2019  . Dyspnea 08/22/2019  . CAD (coronary artery disease)   . Hypocalcemia   . S/P shoulder replacement, 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  . History of adenomatous polyp of colon 05/21/2019  . Polypharmacy 05/21/2019  . Benign paroxysmal positional vertigo due to bilateral vestibular disorder 05/20/2019  . Hyperlipidemia associated with type 2 diabetes mellitus ([place]) 05/20/2019  . Vertigo 04/25/2019  . 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[place]) 04/21/2019  . OSA (obstructive sleep apnea) 02/24/2019  . Allergic rhinitis 02/24/2019  . Acute on chronic respiratory failure with hypoxia ([place]) 01/22/2019  . 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 Morbid obesity ([place]) 07/06/2018  . Cardiomegaly 01/12/2018  . Non-alcoholic fatty liver disease 01/12/2018  . Mild intermittent asthma 01/12/2018  . Senile osteoporosis 12/15/2017  . Asthma 11/12/2017  . Fibromyalgia 03/19/2017  . Migraine headache with aura 02/12/2016  . Diabetic neuropathy associated with type 2 diabetes mellitus ([place]) 02/06/2016  . Bipolar 1 disorder, manic, moderate ([place]) 10/04/2015  . CHF (congestive heart  failure) ([place]) 10/04/2015  . Depression, recurrent ([place])  10/03/2015  . Herpes genitalis in women 07/16/2015  . Long term current use of anticoagulant 05/30/2015  . Family history of coronary arteriosclerosis 05/30/2015  . Chronic diastolic heart failure ([place]) 05/30/2015  . Dyspnea on exertion   . Hypertension associated with diabetes ([place])   . Restless legs 04/27/2015  . Lipoma 02/08/2015  . Bipolar disorder ([place]) 01/23/2015  . Insomnia 01/23/2015  . Chronic back pain 01/04/2015  . Chronic atrial fibrillation ([place]) 01/04/2015  . DM2 (diabetes mellitus, type 2) ([place])   . Hyperlipidemia    PCP:  [name], MD Pharmacy:   [facility] [place] [address] [place] [address] Phone: [phone] Fax: [phone]  [facility] [phone] - [place], [place] [address] [place] [address] Phone: [phone] Fax: [phone]  Readmission Risk Interventions No flowsheet data found.

## 2020-04-23 NOTE — ED Notes (Signed)
Bladder scan performed and found 180 ml of urine.

## 2020-04-23 NOTE — ED Notes (Signed)
Per Dr. Denton Brick the insulin gtt is to stopped for patient to have MRI completed.

## 2020-04-23 NOTE — ED Triage Notes (Signed)
Patient reports bilateral lower extremity weakness that started five days ago and is progressively getting worse. Currently on Prednisone to treat lower back pain. Multiple falls in the past two days with new onset urinary incontinence.

## 2020-04-23 NOTE — ED Notes (Signed)
CRITICAL VALUE ALERT  Critical Value:  Glucose 612  Date & Time Notied:  1045hrs & 04/23/2020  Provider Notified: EDP  Orders Received/Actions taken: Notified

## 2020-04-23 NOTE — ED Notes (Signed)
Patient off unit to MRI at this time.   

## 2020-04-23 NOTE — ED Notes (Signed)
Per Dr. Denton Brick, re-start insulin drip once patient returns from MRI and administer Lantus per order. After Lantus administered and 90-120 minutes have lapsed, discontinue insulin drip at which time patient can proceed to assigned bed.

## 2020-04-23 NOTE — H&P (Signed)
Patient Demographics:    Amber Stephenson, is a 74 y.o. 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)   Fall at home, initial encounter   DM2 (diabetes mellitus, type 2) ([place])   Chronic atrial fibrillation (HCC)   Bipolar disorder ([place])   Hypertension associated with diabetes ([place])   Chronic diastolic heart failure (HCC)   OSA (obstructive sleep apnea)   Chronic pain syndrome   CAD (coronary artery disease)    1)Ambulatory Dysfunction and Falls at home--- MRI brain without acute findings MRI C-spine thoracic spine without acute findings, MRI lumbar spine with --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)- --EDP discussed with neurosurgeon who advised no intervention -EDP discussed with neurologist who advised medical management and therapy --ambulatory dysfunction and  recurrent falls unsafe discharge plan at this time -Get PT OT eval possible transfer to SNF rehab  2) uncontrolled DM 2 with severe Hyperglycemia--prior A1c above 7 patient with history of uncontrolled DM with hyperglycemia -Admitted this time with glucose of 612, bicarb is 25 and anion gap is 12 -Received IV insulin in the ED okay to transition to subcu insulin and IV fluids -Use Novolog/Humalog Sliding scale insulin with Accu-Cheks/Fingersticks as ordered   3)Back pain/sacroiliitis--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)--- please see #1 above -Be judicious with  opiates  4) leukocytosis--no evidence of significant infection at this time may be reactive  5) bipolar disorder--- stable, continue Tegretol, Topamax, lorazepam as needed  6) chronic atrial fibrillation--continue Cardizem and metoprolol for rate control Eliquis for stroke prophylaxis  7) chronic pain syndrome--be judicious with opiates, continue Topamax, Lyrica and Tegretol  8) COPD--- stable, continue bronchodilators currently not requiring oxygen  9) history of CAD--no ACS type symptoms at this time, continue metoprolol patient already on Eliquis so do not give aspirin, continue Lipitor  Disposition/Need for in-Hospital Stay- patient unable to be discharged at this time due to --ambulatory dysfunction recurrent falls unsafe discharge plan at this time  Dispo: The patient is from: Home              Anticipated d/c is to: SNF              Anticipated d/c date is: 1 day              Patient currently is not medically stable to d/c. Barriers: Not Clinically Stable- --ambulatory dysfunction recurrent falls unsafe discharge plan at this time  With History of - Reviewed by me  Past Medical History:  Diagnosis Date  . Atrial fibrillation with  RVR 05/19/2017  . Bipolar I disorder 01/23/2015  . CAD (coronary artery disease)    Nonobstructive; Managed by Dr. Bronson Ing  . Cardiomegaly 01/12/2018  . Chronic anticoagulation 05/30/2015  . Chronic back pain    Lower back  . Chronic diastolic CHF (congestive heart failure) 05/30/2015  . Diabetes mellitus, type 2, without complication   . Diabetic neuropathy 02/06/2016  . Dysrhythmia    A-Fib  . Essential hypertension   . Herpes genitalis in women 07/16/2015  . Hyperlipidemia   . Hypertension   . Hypoxia 02/01/2019  . Insomnia 01/23/2015  . Lipoma 02/08/2015  . Major depressive disorder   . Migraine headache with aura 02/12/2016  . 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[place]) 04/21/2019  . NAFL (nonalcoholic fatty liver) 2/50/5397  . OSA  (obstructive sleep apnea) 02/24/2019   10/09/2018 - HST  - AHI 40.6   . Pulmonary hypertension   . Renal insufficiency    Managed by Dr. Wallace Keller  . RLS (restless legs syndrome) 04/27/2015      Past Surgical History:  Procedure Laterality Date  . BREAST REDUCTION SURGERY    . EYE SURGERY Right    cateracts  . HAMMER TOE SURGERY    . LEFT HEART CATH AND CORONARY ANGIOGRAPHY N/A 02/03/2018   Procedure: LEFT HEART CATH AND CORONARY ANGIOGRAPHY;  Surgeon: Martinique, Peter M, MD;  Location: [facility];  Service: Cardiovascular;  Laterality: N/A;  . REVERSE SHOULDER ARTHROPLASTY Right 08/19/2019   Procedure: REVERSE SHOULDER ARTHROPLASTY;  Surgeon: Netta Cedars, MD;  Location: [facility];  Service: Orthopedics;  Laterality: Right;  interscalene block  . SHOULDER SURGERY Right    "I BROKE MY SHOUDLER  . THIGH SURGERY     "TO REMOVE A TUMOR "    Chief Complaint  Patient presents with  . Extremity Weakness      HPI:    Amber Stephenson  is a 74 y.o. female with past medical history relevant for obesity, OSA, COPD, history of CAD, chronic pain syndrome, chronic back pain and chronic A. fib on anticoagulation,  bipolar disorder with uncontrolled diabetes who presents to the ED with her husband with concerns about worsening back pain and ambulatory dysfunction and falls -- -As per patient's husband she has had back pain for about 12 years, worse over the last few years, she has been getting some injections lately -Over the last few weeks patient is gradually declining over the last week she has had significant difficulty when ambulating -She has not had physical therapy for a while now, -In the ED - MRI brain without acute findings MRI C-spine and thoracic spine without acute findings, MRI lumbar spine with --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)- --EDP discussed with neurosurgeon who advised no intervention -EDP discussed with neurologist who  advised medical management and therapy --In the ED patient is also found to have  glucose of 612, bicarb is 25 and anion gap is 12 -Received IV insulin in the ED okay to transition to subcu insulin and IV fluids -Use Novolog/Humalog Sliding scale insulin with Accu-Cheks/Fingersticks as ordered   -leukocytosis noted however no evidence of significant infection at this time may be reactive  No fever  Or chills  -No chest pains or ACS type symptoms - -ambulatory dysfunction recurrent falls unsafe discharge plan at this time     Review of systems:    In addition to the HPI above,   A full Review of  Systems was done, all other systems reviewed are negative except as noted above in HPI , .    Social History:  Reviewed by me    Social History   Tobacco Use  . Smoking status: Never Smoker  . Smokeless tobacco: Never Used  Substance Use Topics  . Alcohol use: Not Currently    Alcohol/week: 0.0 standard drinks       Family History :  Reviewed by me    Family History  Problem Relation Age of Onset  . Diabetes Mother   . Heart disease Mother   . Heart disease Brother   . Heart disease Sister        CABG  . Diabetes Sister   . Alzheimer's disease Father   . Alcohol abuse Sister   . Mental illness Brother   . Diabetes Brother      Home Medications:   Prior to Admission medications   Medication Sig Start Date End Date Taking? Authorizing Provider  acetaminophen (TYLENOL) 325 MG tablet Take 325-650 mg by mouth every 6 (six) hours as needed for mild pain, moderate pain or headache.    Yes [provider]  albuterol (PROVENTIL) (2.5 MG/3ML) 0.083% nebulizer solution Take 3 mLs (2.5 mg total) by nebulization every 6 (six) hours as needed for wheezing or shortness of breath. 08/29/19  Yes Rakes, Connye Burkitt, FNP  Apoaequorin (PREVAGEN PO) Take 1 tablet by mouth daily.   Yes [provider]  atorvastatin (LIPITOR) 40 MG tablet Take 1 tablet (40 mg total) by  mouth daily. 05/20/19  Yes Rakes, Connye Burkitt, FNP  budesonide-formoterol (SYMBICORT) 80-4.5 MCG/ACT inhaler Inhale 2 puffs into the lungs 2 (two) times daily. Patient taking differently: Inhale 2 puffs into the lungs 2 (two) times daily as needed. Shortness of breath 08/29/19  Yes Rakes, Connye Burkitt, FNP  carbamazepine (CARBATROL) 100 MG 12 hr capsule Take 1 capsule (100 mg total) by mouth daily. 02/22/20 02/21/21 Yes Plovsky, Berneta Sages, MD  Cholecalciferol (VITAMIN D3) 1000 UNITS CAPS Take 1,000 Units by mouth daily.   Yes [provider]  diclofenac Sodium (VOLTAREN) 1 % GEL Apply 2 g topically 4 (four) times daily. 04/15/20  Yes Wieters, Hallie C, PA-C  diltiazem (CARDIZEM CD) 120 MG 24 hr capsule TAKE 1 CAPSULE AT BEDTIME 09/19/19  Yes Herminio Commons, MD  ELIQUIS 5 MG TABS tablet TAKE 1 TABLET TWICE A DAY Patient taking differently: Take 5 mg by mouth 2 (two) times daily.  08/01/19  Yes Rakes, Connye Burkitt, FNP  escitalopram (LEXAPRO) 10 MG tablet Take 1 tablet (10 mg total) by mouth daily. 02/16/20 02/15/21 Yes Plovsky, Berneta Sages, MD  fluconazole (DIFLUCAN) 150 MG tablet Take 150 mg by mouth once.  04/19/20  Yes [provider]  flurazepam (DALMANE) 30 MG capsule Take 1 capsule (30 mg total) by mouth at bedtime as needed for sleep. 02/22/20  Yes Plovsky, Berneta Sages, MD  furosemide (LASIX) 40 MG tablet TAKE 1 TABLET EVERY MORNING  (DOSE INCREASED) Patient taking differently: Take 40 mg by mouth daily.  10/12/19  Yes Herminio Commons, MD  Insulin Glargine (LANTUS SOLOSTAR) 100 UNIT/ML Solostar Pen INJECT 60 UNITS UNDER THE SKIN TWICE A DAY Patient taking differently: Inject 60 Units into the skin 2 (two) times daily.  01/25/18  Yes Herminio Commons, MD  LORazepam (ATIVAN) 1 MG tablet Take 1-2 tablets (1-2 mg total) by mouth See admin instructions. Take 1 tab (1 mg) in the morning and 2 tabs (2 mg ) at  bedtime 02/22/20  Yes Plovsky, Berneta Sages, MD  meclizine (ANTIVERT) 12.5 MG tablet Take 1 tablet (12.5  mg total) by mouth 3 (three) times daily as needed for dizziness. 02/29/20  Yes Strader, Shannon, PA-C  metoprolol tartrate (LOPRESSOR) 25 MG tablet TAKE 1 TABLET BY MOUTH TWICE DAILY(TAKEN TOGETHER WITH LOPRESSOR 100 MG TWICE DAILY) 03/29/20  Yes Strader, [place] M, PA-C  nystatin (MYCOSTATIN/NYSTOP) powder Apply topically 3 (three) times daily. x2 weeks (for groin/breast rash) 10/17/19  Yes Stacks, Cletus Gash, MD  oxyCODONE-acetaminophen (PERCOCET/ROXICET) 5-325 MG tablet Take 1 tablet by mouth every 6 (six) hours as needed. for pain 03/26/20  Yes Lovorn, Jinny Blossom, MD  potassium chloride SA (KLOR-CON) 20 MEQ tablet Take 1 tablet (20 mEq total) by mouth daily. 03/30/20  Yes Stacks, Cletus Gash, MD  predniSONE (DELTASONE) 10 MG tablet Take 5 daily for 3 days followed by 4,3,2 and 1 for 3 days each. 04/19/20  Yes Stacks, Cletus Gash, MD  pregabalin (LYRICA) 200 MG capsule Take 1 capsule (200 mg total) by mouth 2 (two) times daily. 04/15/20 05/15/20 Yes Wieters, Hallie C, PA-C  PROAIR HFA 108 (90 Base) MCG/ACT inhaler Inhale 1-2 puffs into the lungs every 6 (six) hours as needed. As needed 08/29/19  Yes Rakes, Connye Burkitt, FNP  rizatriptan (MAXALT) 10 MG tablet Take 1 tablet at onset of migraine. Do not take more than 2-3 a week 11/15/19  Yes Cameron Sprang, MD  Semaglutide (RYBELSUS) 7 MG TABS Take 7 mg by mouth daily.  06/17/19  Yes [provider]  SUMAtriptan (IMITREX) 100 MG tablet TAKE 1 TABLET BY MOUTH IF NEEDED FOR MIGRAINE(MAY REPEAT IN 2 HOURS IF HEADACHE PERSISTS OR RECURS) Patient taking differently: Take 100 mg by mouth every 2 (two) hours as needed for migraine or headache.  11/24/19  Yes [name], MD  tizanidine (ZANAFLEX) 4 MG capsule Take 1 capsule (4 mg total) by mouth 3 (three) times daily as needed for muscle spasms. In the left lower back 04/02/20  Yes Stacks, Cletus Gash, MD  topiramate (TOPAMAX) 25 MG tablet Take 2 tablets in AM, 3 tablets in PM 11/08/19  Yes Cameron Sprang, MD  triamcinolone  ointment (KENALOG) 0.5 % Apply 1 application topically 2 (two) times daily as needed (skin irritation/rash.). 11/29/19  Yes Stacks, Cletus Gash, MD  VASCEPA 1 g capsule TAKE 2 CAPSULES TWICE A DAY WITH MEALS Patient taking differently: Take 2 g by mouth 2 (two) times daily before a meal.  03/29/20  Yes Hawks, Christy A, FNP  ARIPiprazole (ABILIFY) 10 MG tablet Take 1 tablet (10 mg total) by mouth once for 1 dose. 02/22/20 04/19/20  Norma Fredrickson, MD     Allergies:     Allergies  Allergen Reactions  . Ivp Dye [Iodinated Diagnostic Agents] Anaphylaxis and Swelling    Throat closes   . Latex     Latex tape pulls skin with it     Physical Exam:   Vitals  Blood pressure 121/87, pulse (!) 46, temperature (!) 97.1 F (36.2 C), temperature source Oral, resp. rate 18, height 5\' 2"  (1.575 m), weight 81.6 kg, SpO2 100 %.  Physical Examination: General appearance - alert, chronically ill  appearing,  Mental status - alert, oriented to person, place, and time, Eyes - sclera anicteric Neck - supple, no JVD elevation , Chest - clear  to auscultation bilaterally, symmetrical air movement,  Heart - S1 and S2 normal, irregularly irregular Abdomen - soft, nontender, nondistended, no masses or organomegaly Neurological - screening mental status exam  normal, neck supple without rigidity, cranial nerves II through XII intact, patient with significant ambulatory dysfunction, unsteady gait, bilateral lower extremity weakness, limited range of motion lumbar spine due to pain and unsteadiness Extremities - no pedal edema noted, intact peripheral pulses  Skin - warm, dry     Data Review:    CBC Recent Labs  Lab 04/23/20 1010 04/23/20 1500  WBC 12.3* 12.2*  HGB 14.5 14.3  HCT 41.9 41.8  PLT 218 211  MCV 90.1 90.5  MCH 31.2 31.0  MCHC 34.6 34.2  RDW 11.8 11.8  LYMPHSABS  --  3.3  MONOABS  --  0.6  EOSABS  --  0.0  BASOSABS  --  0.0    ------------------------------------------------------------------------------------------------------------------  Chemistries  Recent Labs  Lab 04/23/20 1010  NA 130*  K 4.3  CL 93*  CO2 25  GLUCOSE 612*  BUN 35*  CREATININE 1.09*  CALCIUM 9.4  AST 15  ALT 17  ALKPHOS 157*  BILITOT 0.7   ------------------------------------------------------------------------------------------------------------------ estimated creatinine clearance is 46.2 mL/min (A) (by C-G formula based on SCr of 1.09 mg/dL (H)). ------------------------------------------------------------------------------------------------------------------ No results for input(s): TSH, T4TOTAL, T3FREE, THYROIDAB in the last 72 hours.  Invalid input(s): FREET3   Coagulation profile No results for input(s): INR, PROTIME in the last 168 hours. ------------------------------------------------------------------------------------------------------------------- No results for input(s): DDIMER in the last 72 hours. -------------------------------------------------------------------------------------------------------------------  Cardiac Enzymes No results for input(s): CKMB, TROPONINI, MYOGLOBIN in the last 168 hours.  Invalid input(s): CK ------------------------------------------------------------------------------------------------------------------    Component Value Date/Time   BNP 151.0 (H) 08/22/2019 2055     ---------------------------------------------------------------------------------------------------------------  Urinalysis    Component Value Date/Time   COLORURINE STRAW (A) 01/21/2019 1513   APPEARANCEUR Clear 12/06/2019 1428   LABSPEC 1.009 01/21/2019 1513   PHURINE 7.0 01/21/2019 1513   GLUCOSEU Negative 12/06/2019 1428   HGBUR NEGATIVE 01/21/2019 1513   BILIRUBINUR Negative 12/06/2019 1428   KETONESUR NEGATIVE 01/21/2019 1513   PROTEINUR Negative 12/06/2019 1428   PROTEINUR NEGATIVE  01/21/2019 1513   UROBILINOGEN 0.2 07/23/2015 1350   NITRITE Negative 12/06/2019 1428   NITRITE NEGATIVE 01/21/2019 1513   LEUKOCYTESUR Negative 12/06/2019 1428   LEUKOCYTESUR NEGATIVE 01/21/2019 1513    ----------------------------------------------------------------------------------------------------------------   Imaging Results:    CT Head Wo Contrast  Result Date: 04/23/2020 CLINICAL DATA:  Dizziness after fall EXAM: CT HEAD WITHOUT CONTRAST TECHNIQUE: Contiguous axial images were obtained from the base of the skull through the vertex without intravenous contrast. COMPARISON:  August 10, 2017. FINDINGS: Brain: Ventricles and sulci appear within normal limits for age. There is no intracranial mass, hemorrhage, extra-axial fluid collection, or midline shift. Minimal small vessel disease in the centra semiovale is stable. No acute infarct evident. Vascular: No hyperdense vessel. Calcification noted in the carotid siphon regions. Skull: Bony calvarium appears intact. Sinuses/Orbits: There is mucosal thickening in several ethmoid air cells. Other visualized paranasal sinuses are clear. Orbits appear symmetric bilaterally. Other: Mastoid air cells are clear. IMPRESSION: Minimal periventricular small vessel disease. No acute infarct. No mass or hemorrhage. Foci of arterial vascular calcification noted. Mucosal thickening noted in several ethmoid air cells. Electronically Signed   By: Lowella Grip III M.D.   On: 04/23/2020 10:59   MR BRAIN WO CONTRAST  Result Date: 04/23/2020 CLINICAL DATA:  Dizziness. EXAM: MRI HEAD WITHOUT CONTRAST TECHNIQUE: Multiplanar, multiecho pulse sequences of the brain and surrounding structures were obtained without intravenous contrast. COMPARISON:  CT head April 23, 2020. FINDINGS: Brain: No acute infarction, hemorrhage, hydrocephalus, extra-axial collection or mass  lesion. Scattered T2/FLAIR hyperintensities within the white matter, compatible with chronic  microvascular ischemic disease and appropriate for patient's age. Mild diffuse cerebral atrophy. Vascular: Major arterial flow voids are maintained at the skull base. Skull and upper cervical spine: Normal marrow signal. Sinuses/Orbits: Mild ethmoid air cell mucosal thickening. Remaining sinuses are clear. Unremarkable orbits. Other: No mastoid effusions. IMPRESSION: No evidence of acute intracranial abnormality. Specifically, no acute infarct. Electronically Signed   By: Margaretha Sheffield MD   On: 04/23/2020 13:33   MR Cervical Spine Wo Contrast  Result Date: 04/23/2020 CLINICAL DATA:  Cervical radiculopathy, infection suspected EXAM: MRI CERVICAL SPINE WITHOUT CONTRAST TECHNIQUE: Multiplanar, multisequence MR imaging of the cervical spine was performed. No intravenous contrast was administered. COMPARISON:  None. FINDINGS: Alignment: Straightening of lordosis. Minimal grade 1 C5-6 and C6-7 retrolisthesis. Vertebrae: Normal bone marrow signal intensity. No focal osseous lesion. Cord: Normal signal and morphology. Posterior Fossa, vertebral arteries: Please see same day MRI head for findings above the foramen magnum. Disc levels: Multilevel desiccation with mild disc space loss. C2-3: Bilateral facet degenerative spurring. Patent spinal canal and neural foramen. C3-4: Small disc osteophyte complex with uncovertebral and facet hypertrophy. Patent spinal canal and neural foramen. C4-5: Disc osteophyte complex with superimposed left paracentral protrusion abutting the ventral cord. Uncovertebral and facet degenerative spurring. Patent spinal canal and neural foramen. C5-6: Disc osteophyte complex with superimposed central protrusion abutting the ventral cord. Uncovertebral and facet degenerative spurring. Patent spinal canal and neural foramen. C6-7: Disc osteophyte complex with uncovertebral and facet hypertrophy. Patent spinal canal and right neural foramen. Mild left neural foraminal narrowing. C7-T1: No  significant disc bulge, spinal canal or neural foraminal narrowing. Paraspinal tissues: Negative. IMPRESSION: No evidence of cervical spine infection. Multilevel spondylosis. Patent spinal canal. Mild left C6-7 neural foraminal narrowing. Central/paracentral protrusions at the C4-5 and C5-6 level abutting the ventral cord. No evidence of myelomalacia. Electronically Signed   By: Primitivo Gauze M.D.   On: 04/23/2020 17:06   MR THORACIC SPINE WO CONTRAST  Result Date: 04/23/2020 CLINICAL DATA:  Mid back pain, neuro deficit, ataxia. EXAM: MRI THORACIC SPINE WITHOUT CONTRAST TECHNIQUE: Multiplanar, multisequence MR imaging of the thoracic spine was performed. No intravenous contrast was administered. COMPARISON:  Concurrent MRI cervical spine. FINDINGS: Alignment:  Normal. Vertebrae: No focal osseous lesion. Modic type 2 endplate degenerative changes involving the inferior T9 endplate. Chronic superior T11 endplate deformity. Schmorl's node formation at the T11 and T12 levels. Remaining vertebral body heights are preserved. Cord:  Normal signal and morphology. Paraspinal and other soft tissues: 4 mm right T1-2 perineural cyst. Disc levels: T10-11 left paracentral protrusion partially effacing the ventral CSF containing spaces. Patent spinal canal and neural foramen. Multilevel facet degenerative spurring. IMPRESSION: No evidence of infection. Mild degenerative changes and chronic superior T11 endplate deformity with minimal height loss. Shallow left T10-11 paracentral protrusion. Patent spinal canal and neural foramen. Electronically Signed   By: Primitivo Gauze M.D.   On: 04/23/2020 17:15   MR LUMBAR SPINE WO CONTRAST  Result Date: 04/23/2020 CLINICAL DATA:  Low back pain. Unable to walk. Bilateral leg weakness. EXAM: MRI LUMBAR SPINE WITHOUT CONTRAST TECHNIQUE: Multiplanar, multisequence MR imaging of the lumbar spine was performed. No intravenous contrast was administered. COMPARISON:  MRI lumbar  spine 03/27/2019. FINDINGS: Segmentation:  Standard. Alignment:  Physiologic. Vertebrae: No evidence of acute fracture, discitis/osteomyelitis, or suspicious bone lesion. Degenerative Schmorl's nodes. Conus medullaris and cauda equina: Conus extends to the L1-L2 level. Conus appears normal. Approximately 5 mm round  mass within the dorsal spinal canal at L2 (see series 14, image 8 and series 17, image 8). Paraspinal and other soft tissues: Unremarkable. Disc levels: T12-L1: No significant disc protrusion, foraminal stenosis, or canal stenosis. L1-L2: Small broad-based disc bulge with tiny right paracentral disc protrusion, similar to prior. Mild bilateral facet hypertrophy. No significant canal or foraminal stenosis. L2-L3: Similar mild broad-based disc bulge with moderate right and mild left facet hypertrophy. No significant canal or foraminal stenosis. L3-L4: Similar right eccentric broad-based disc bulge with moderate right and mild left facet hypertrophy. No significant canal or foraminal stenosis. Mild right subarticular recess stenosis. L4-L5: Similar broad-based disc bulge with small broad left foraminal disc protrusion and moderate bilateral facet hypertrophy. Mild left foraminal and left subarticular recess stenosis. No significant canal stenosis. L5-S1: No significant disc bulge. Similar bilateral facet hypertrophy. No evidence of significant canal or foraminal stenosis. IMPRESSION: 1. 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 2. Similar multilevel degenerative change without significant central canal stenosis. Similar mild left foraminal stenosis at L4-L5. Electronically Signed   By: Margaretha Sheffield MD   On: 04/23/2020 13:28   MR LUMBAR SPINE W CONTRAST  Result Date: 04/23/2020 CLINICAL DATA:   Infection suspected, radiculopathy. EXAM: MRI LUMBAR SPINE WITH CONTRAST CONTRAST:  90mL GADAVIST GADOBUTROL 1 MMOL/ML IV SOLN COMPARISON:  Prior same day MRI lumbar spine without contrast. FINDINGS: Segmentation:  Standard. Alignment:  Normal. Vertebrae: Normal bone marrow signal intensity. No enhancing osseous lesions. Conus medullaris and cauda equina: Conus extends to the L1-2 level. Conus and cauda equina appear normal. 5-6 mm rounded mass within the dorsal thecal sac at the L2 level demonstrates mild homogeneous enhancement (5:9). Disc levels: Degenerative changes are better evaluated on prior same day noncontrast MRI. Paraspinal and other soft tissues: Negative. 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 No evidence of spinal infection. Electronically Signed   By: Primitivo Gauze M.D.   On: 04/23/2020 17:27    Radiological Exams on Admission: CT Head Wo Contrast  Result Date: 04/23/2020 CLINICAL DATA:  Dizziness after fall EXAM: CT HEAD WITHOUT CONTRAST TECHNIQUE: Contiguous axial images were obtained from the base of the skull through the vertex without intravenous contrast. COMPARISON:  August 10, 2017. FINDINGS: Brain: Ventricles and sulci appear within normal limits for age. There is no intracranial mass, hemorrhage, extra-axial fluid collection, or midline shift. Minimal small vessel disease in the centra semiovale is stable. No acute infarct evident. Vascular: No hyperdense vessel. Calcification noted in the carotid siphon regions. Skull: Bony calvarium appears intact. Sinuses/Orbits: There is mucosal thickening in several ethmoid air cells. Other visualized paranasal sinuses are clear. Orbits appear symmetric bilaterally. Other: Mastoid air cells are clear. IMPRESSION: Minimal periventricular small vessel disease. No acute infarct. No mass or hemorrhage. Foci of arterial vascular calcification noted. Mucosal thickening noted in  several ethmoid air cells. Electronically Signed   By: Lowella Grip III M.D.   On: 04/23/2020 10:59   MR BRAIN WO CONTRAST  Result Date: 04/23/2020 CLINICAL DATA:  Dizziness. EXAM: MRI HEAD WITHOUT CONTRAST TECHNIQUE: Multiplanar, multiecho pulse sequences of the brain and surrounding structures were obtained without intravenous contrast. COMPARISON:  CT head April 23, 2020. FINDINGS: Brain: No acute infarction, hemorrhage, hydrocephalus, extra-axial collection or mass lesion. Scattered T2/FLAIR hyperintensities  within the white matter, compatible with chronic microvascular ischemic disease and appropriate for patient's age. Mild diffuse cerebral atrophy. Vascular: Major arterial flow voids are maintained at the skull base. Skull and upper cervical spine: Normal marrow signal. Sinuses/Orbits: Mild ethmoid air cell mucosal thickening. Remaining sinuses are clear. Unremarkable orbits. Other: No mastoid effusions. IMPRESSION: No evidence of acute intracranial abnormality. Specifically, no acute infarct. Electronically Signed   By: Margaretha Sheffield MD   On: 04/23/2020 13:33   MR Cervical Spine Wo Contrast  Result Date: 04/23/2020 CLINICAL DATA:  Cervical radiculopathy, infection suspected EXAM: MRI CERVICAL SPINE WITHOUT CONTRAST TECHNIQUE: Multiplanar, multisequence MR imaging of the cervical spine was performed. No intravenous contrast was administered. COMPARISON:  None. FINDINGS: Alignment: Straightening of lordosis. Minimal grade 1 C5-6 and C6-7 retrolisthesis. Vertebrae: Normal bone marrow signal intensity. No focal osseous lesion. Cord: Normal signal and morphology. Posterior Fossa, vertebral arteries: Please see same day MRI head for findings above the foramen magnum. Disc levels: Multilevel desiccation with mild disc space loss. C2-3: Bilateral facet degenerative spurring. Patent spinal canal and neural foramen. C3-4: Small disc osteophyte complex with uncovertebral and facet hypertrophy.  Patent spinal canal and neural foramen. C4-5: Disc osteophyte complex with superimposed left paracentral protrusion abutting the ventral cord. Uncovertebral and facet degenerative spurring. Patent spinal canal and neural foramen. C5-6: Disc osteophyte complex with superimposed central protrusion abutting the ventral cord. Uncovertebral and facet degenerative spurring. Patent spinal canal and neural foramen. C6-7: Disc osteophyte complex with uncovertebral and facet hypertrophy. Patent spinal canal and right neural foramen. Mild left neural foraminal narrowing. C7-T1: No significant disc bulge, spinal canal or neural foraminal narrowing. Paraspinal tissues: Negative. IMPRESSION: No evidence of cervical spine infection. Multilevel spondylosis. Patent spinal canal. Mild left C6-7 neural foraminal narrowing. Central/paracentral protrusions at the C4-5 and C5-6 level abutting the ventral cord. No evidence of myelomalacia. Electronically Signed   By: Primitivo Gauze M.D.   On: 04/23/2020 17:06   MR THORACIC SPINE WO CONTRAST  Result Date: 04/23/2020 CLINICAL DATA:  Mid back pain, neuro deficit, ataxia. EXAM: MRI THORACIC SPINE WITHOUT CONTRAST TECHNIQUE: Multiplanar, multisequence MR imaging of the thoracic spine was performed. No intravenous contrast was administered. COMPARISON:  Concurrent MRI cervical spine. FINDINGS: Alignment:  Normal. Vertebrae: No focal osseous lesion. Modic type 2 endplate degenerative changes involving the inferior T9 endplate. Chronic superior T11 endplate deformity. Schmorl's node formation at the T11 and T12 levels. Remaining vertebral body heights are preserved. Cord:  Normal signal and morphology. Paraspinal and other soft tissues: 4 mm right T1-2 perineural cyst. Disc levels: T10-11 left paracentral protrusion partially effacing the ventral CSF containing spaces. Patent spinal canal and neural foramen. Multilevel facet degenerative spurring. IMPRESSION: No evidence of infection.  Mild degenerative changes and chronic superior T11 endplate deformity with minimal height loss. Shallow left T10-11 paracentral protrusion. Patent spinal canal and neural foramen. Electronically Signed   By: Primitivo Gauze M.D.   On: 04/23/2020 17:15   MR LUMBAR SPINE WO CONTRAST  Result Date: 04/23/2020 CLINICAL DATA:  Low back pain. Unable to walk. Bilateral leg weakness. EXAM: MRI LUMBAR SPINE WITHOUT CONTRAST TECHNIQUE: Multiplanar, multisequence MR imaging of the lumbar spine was performed. No intravenous contrast was administered. COMPARISON:  MRI lumbar spine 03/27/2019. FINDINGS: Segmentation:  Standard. Alignment:  Physiologic. Vertebrae: No evidence of acute fracture, discitis/osteomyelitis, or suspicious bone lesion. Degenerative Schmorl's nodes. Conus medullaris and cauda equina: Conus extends to the L1-L2 level. Conus appears normal. Approximately 5 mm round mass within the dorsal spinal  canal at L2 (see series 14, image 8 and series 17, image 8). Paraspinal and other soft tissues: Unremarkable. Disc levels: T12-L1: No significant disc protrusion, foraminal stenosis, or canal stenosis. L1-L2: Small broad-based disc bulge with tiny right paracentral disc protrusion, similar to prior. Mild bilateral facet hypertrophy. No significant canal or foraminal stenosis. L2-L3: Similar mild broad-based disc bulge with moderate right and mild left facet hypertrophy. No significant canal or foraminal stenosis. L3-L4: Similar right eccentric broad-based disc bulge with moderate right and mild left facet hypertrophy. No significant canal or foraminal stenosis. Mild right subarticular recess stenosis. L4-L5: Similar broad-based disc bulge with small broad left foraminal disc protrusion and moderate bilateral facet hypertrophy. Mild left foraminal and left subarticular recess stenosis. No significant canal stenosis. L5-S1: No significant disc bulge. Similar bilateral facet hypertrophy. No evidence of  significant canal or foraminal stenosis. IMPRESSION: 1. 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 2. Similar multilevel degenerative change without significant central canal stenosis. Similar mild left foraminal stenosis at L4-L5. Electronically Signed   By: Margaretha Sheffield MD   On: 04/23/2020 13:28   MR LUMBAR SPINE W CONTRAST  Result Date: 04/23/2020 CLINICAL DATA:  Infection suspected, radiculopathy. EXAM: MRI LUMBAR SPINE WITH CONTRAST CONTRAST:  22mL GADAVIST GADOBUTROL 1 MMOL/ML IV SOLN COMPARISON:  Prior same day MRI lumbar spine without contrast. FINDINGS: Segmentation:  Standard. Alignment:  Normal. Vertebrae: Normal bone marrow signal intensity. No enhancing osseous lesions. Conus medullaris and cauda equina: Conus extends to the L1-2 level. Conus and cauda equina appear normal. 5-6 mm rounded mass within the dorsal thecal sac at the L2 level demonstrates mild homogeneous enhancement (5:9). Disc levels: Degenerative changes are better evaluated on prior same day noncontrast MRI. Paraspinal and other soft tissues: Negative. 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 No evidence of spinal infection. Electronically Signed   By: Primitivo Gauze M.D.   On: 04/23/2020 17:27    DVT Prophylaxis -SCD /ELIQUIS  AM Labs Ordered, also please review Full Orders  Family Communication: Admission, patients condition and plan of care including tests being ordered have been discussed with the patient and husband who indicate understanding and agree with the plan   Code Status - Full Code  Likely DC to SNF rehab most likely  Condition   stable  Roxan Hockey M.D on 04/23/2020 at 5:46 PM Go to [email or web address] -  for contact info  [facility] - Office  [phone]

## 2020-04-24 ENCOUNTER — Inpatient Hospital Stay
Admission: RE | Admit: 2020-04-24 | Discharge: 2020-05-14 | Disposition: A | Discharge status: Home / Self Care | Payer: Medicare Other | Source: Ambulatory Visit | Attending: Internal Medicine | Admitting: Internal Medicine

## 2020-04-24 DIAGNOSIS — E1169 Type 2 diabetes mellitus with other specified complication: Secondary | ICD-10-CM | POA: Diagnosis not present

## 2020-04-24 DIAGNOSIS — E1142 Type 2 diabetes mellitus with diabetic polyneuropathy: Secondary | ICD-10-CM | POA: Diagnosis not present

## 2020-04-24 DIAGNOSIS — Z96611 Presence of right artificial shoulder joint: Secondary | ICD-10-CM | POA: Diagnosis not present

## 2020-04-24 DIAGNOSIS — E119 Type 2 diabetes mellitus without complications: Secondary | ICD-10-CM | POA: Diagnosis not present

## 2020-04-24 DIAGNOSIS — M797 Fibromyalgia: Secondary | ICD-10-CM | POA: Diagnosis not present

## 2020-04-24 DIAGNOSIS — E1143 Type 2 diabetes mellitus with diabetic autonomic (poly)neuropathy: Secondary | ICD-10-CM | POA: Diagnosis not present

## 2020-04-24 DIAGNOSIS — J9611 Chronic respiratory failure with hypoxia: Secondary | ICD-10-CM | POA: Diagnosis not present

## 2020-04-24 DIAGNOSIS — I5032 Chronic diastolic (congestive) heart failure: Secondary | ICD-10-CM | POA: Diagnosis not present

## 2020-04-24 DIAGNOSIS — I2510 Atherosclerotic heart disease of native coronary artery without angina pectoris: Secondary | ICD-10-CM | POA: Diagnosis not present

## 2020-04-24 DIAGNOSIS — Z9181 History of falling: Secondary | ICD-10-CM | POA: Diagnosis not present

## 2020-04-24 DIAGNOSIS — I152 Hypertension secondary to endocrine disorders: Secondary | ICD-10-CM | POA: Diagnosis not present

## 2020-04-24 DIAGNOSIS — G959 Disease of spinal cord, unspecified: Secondary | ICD-10-CM | POA: Diagnosis not present

## 2020-04-24 DIAGNOSIS — Z23 Encounter for immunization: Secondary | ICD-10-CM | POA: Diagnosis not present

## 2020-04-24 DIAGNOSIS — G4733 Obstructive sleep apnea (adult) (pediatric): Secondary | ICD-10-CM | POA: Diagnosis not present

## 2020-04-24 DIAGNOSIS — F3112 Bipolar disorder, current episode manic without psychotic features, moderate: Secondary | ICD-10-CM | POA: Diagnosis not present

## 2020-04-24 DIAGNOSIS — R2689 Other abnormalities of gait and mobility: Secondary | ICD-10-CM | POA: Diagnosis not present

## 2020-04-24 DIAGNOSIS — J449 Chronic obstructive pulmonary disease, unspecified: Secondary | ICD-10-CM | POA: Diagnosis not present

## 2020-04-24 DIAGNOSIS — F319 Bipolar disorder, unspecified: Secondary | ICD-10-CM | POA: Diagnosis not present

## 2020-04-24 DIAGNOSIS — M5441 Lumbago with sciatica, right side: Secondary | ICD-10-CM | POA: Diagnosis not present

## 2020-04-24 DIAGNOSIS — M6281 Muscle weakness (generalized): Secondary | ICD-10-CM | POA: Diagnosis not present

## 2020-04-24 DIAGNOSIS — G43109 Migraine with aura, not intractable, without status migrainosus: Secondary | ICD-10-CM | POA: Diagnosis not present

## 2020-04-24 DIAGNOSIS — G8929 Other chronic pain: Secondary | ICD-10-CM | POA: Diagnosis not present

## 2020-04-24 DIAGNOSIS — Z741 Need for assistance with personal care: Secondary | ICD-10-CM | POA: Diagnosis not present

## 2020-04-24 DIAGNOSIS — M5442 Lumbago with sciatica, left side: Secondary | ICD-10-CM | POA: Diagnosis not present

## 2020-04-24 DIAGNOSIS — Z20822 Contact with and (suspected) exposure to covid-19: Secondary | ICD-10-CM | POA: Diagnosis not present

## 2020-04-24 DIAGNOSIS — M549 Dorsalgia, unspecified: Secondary | ICD-10-CM | POA: Diagnosis not present

## 2020-04-24 DIAGNOSIS — G894 Chronic pain syndrome: Secondary | ICD-10-CM | POA: Diagnosis not present

## 2020-04-24 DIAGNOSIS — M461 Sacroiliitis, not elsewhere classified: Secondary | ICD-10-CM | POA: Diagnosis not present

## 2020-04-24 DIAGNOSIS — I482 Chronic atrial fibrillation, unspecified: Secondary | ICD-10-CM | POA: Diagnosis not present

## 2020-04-24 DIAGNOSIS — E1165 Type 2 diabetes mellitus with hyperglycemia: Secondary | ICD-10-CM | POA: Diagnosis not present

## 2020-04-24 DIAGNOSIS — I10 Essential (primary) hypertension: Secondary | ICD-10-CM | POA: Diagnosis not present

## 2020-04-24 DIAGNOSIS — I110 Hypertensive heart disease with heart failure: Secondary | ICD-10-CM | POA: Diagnosis not present

## 2020-04-24 DIAGNOSIS — Z794 Long term (current) use of insulin: Secondary | ICD-10-CM | POA: Diagnosis not present

## 2020-04-24 DIAGNOSIS — R262 Difficulty in walking, not elsewhere classified: Secondary | ICD-10-CM | POA: Diagnosis not present

## 2020-04-24 DIAGNOSIS — E1159 Type 2 diabetes mellitus with other circulatory complications: Secondary | ICD-10-CM | POA: Diagnosis not present

## 2020-04-24 DIAGNOSIS — R270 Ataxia, unspecified: Secondary | ICD-10-CM | POA: Diagnosis not present

## 2020-04-24 DIAGNOSIS — Z7901 Long term (current) use of anticoagulants: Secondary | ICD-10-CM | POA: Diagnosis not present

## 2020-04-24 LAB — CBC
HCT: 40 % (ref 36.0–46.0)
Hemoglobin: 13.5 g/dL (ref 12.0–15.0)
MCH: 30.8 pg (ref 26.0–34.0)
MCHC: 33.8 g/dL (ref 30.0–36.0)
MCV: 91.1 fL (ref 80.0–100.0)
Platelets: 188 K/uL (ref 150–400)
RBC: 4.39 MIL/uL (ref 3.87–5.11)
RDW: 11.7 % (ref 11.5–15.5)
WBC: 11.2 K/uL — ABNORMAL HIGH (ref 4.0–10.5)
nRBC: 0 % (ref 0.0–0.2)

## 2020-04-24 LAB — GLUCOSE, CAPILLARY
Glucose-Capillary: 145 mg/dL — ABNORMAL HIGH (ref 70–99)
Glucose-Capillary: 339 mg/dL — ABNORMAL HIGH (ref 70–99)

## 2020-04-24 LAB — BASIC METABOLIC PANEL WITH GFR
Anion gap: 7 (ref 5–15)
BUN: 27 mg/dL — ABNORMAL HIGH (ref 8–23)
CO2: 25 mmol/L (ref 22–32)
Calcium: 8.4 mg/dL — ABNORMAL LOW (ref 8.9–10.3)
Chloride: 102 mmol/L (ref 98–111)
Creatinine, Ser: 0.75 mg/dL (ref 0.44–1.00)
GFR, Estimated: >60 mL/min
Glucose, Bld: 227 mg/dL — ABNORMAL HIGH (ref 70–99)
Potassium: 3.3 mmol/L — ABNORMAL LOW (ref 3.5–5.1)
Sodium: 134 mmol/L — ABNORMAL LOW (ref 135–145)

## 2020-04-24 LAB — URINALYSIS, ROUTINE W REFLEX MICROSCOPIC
Bilirubin Urine: NEGATIVE
Glucose, UA: 150 mg/dL — AB
Hgb urine dipstick: NEGATIVE
Ketones, ur: NEGATIVE mg/dL
Nitrite: NEGATIVE
Protein, ur: NEGATIVE mg/dL
Specific Gravity, Urine: 1.012 (ref 1.005–1.030)
pH: 7 (ref 5.0–8.0)

## 2020-04-24 MED ORDER — INSULIN ASPART 100 UNIT/ML FLEXPEN
0.0000 [IU] | PEN_INJECTOR | Freq: Three times a day (TID) | SUBCUTANEOUS | 11 refills | Status: DC
Start: 2020-04-24 — End: 2020-04-25

## 2020-04-24 MED ORDER — METOPROLOL TARTRATE 50 MG PO TABS
50.0000 mg | ORAL_TABLET | Freq: Two times a day (BID) | ORAL | 3 refills | Status: DC
Start: 2020-04-24 — End: 2020-05-07

## 2020-04-24 MED ORDER — POTASSIUM CHLORIDE CRYS ER 20 MEQ PO TBCR
40.0000 meq | EXTENDED_RELEASE_TABLET | ORAL | Status: AC
  Administered 2020-04-24 (×2): 40 meq via ORAL
  Filled 2020-04-24 (×2): qty 2

## 2020-04-24 MED ORDER — LANTUS SOLOSTAR 100 UNIT/ML ~~LOC~~ SOPN
PEN_INJECTOR | SUBCUTANEOUS | 1 refills | Status: DC
Start: 2020-04-24 — End: 2020-04-30

## 2020-04-24 MED ORDER — ACETAMINOPHEN 325 MG PO TABS: 650.0000 mg | ORAL_TABLET | Freq: Four times a day (QID) | ORAL | 1 refills | Status: DC | PRN

## 2020-04-24 MED ORDER — ADULT MULTIVITAMIN W/MINERALS CH
1.0000 | ORAL_TABLET | Freq: Every day | ORAL | 3 refills | Status: DC
Start: 2020-04-25 — End: 2020-08-31

## 2020-04-24 MED ORDER — OXYCODONE-ACETAMINOPHEN 5-325 MG PO TABS: 1.0000 | ORAL_TABLET | Freq: Four times a day (QID) | ORAL | 0 refills | Status: DC | PRN

## 2020-04-24 MED ORDER — POLYETHYLENE GLYCOL 3350 17 G PO PACK
17.0000 g | PACK | Freq: Every day | ORAL | 0 refills | Status: DC
Start: 2020-04-25 — End: 2022-02-18

## 2020-04-24 MED ORDER — INSULIN GLARGINE 100 UNIT/ML ~~LOC~~ SOLN
40.0000 [IU] | Freq: Two times a day (BID) | SUBCUTANEOUS | Status: DC
  Administered 2020-04-24: 40 [IU] via SUBCUTANEOUS
  Filled 2020-04-24 (×3): qty 0.4

## 2020-04-24 MED ORDER — TOPIRAMATE 25 MG PO TABS
75.0000 mg | ORAL_TABLET | Freq: Every day | ORAL | 3 refills | Status: DC
Start: 2020-04-24 — End: 2020-05-11

## 2020-04-24 NOTE — Clinical Social Work Note (Signed)
PASRR was received: [id number].

## 2020-04-24 NOTE — Plan of Care (Signed)
  Problem: Acute Rehab OT Goals (only OT should resolve) Goal: Pt. Will Perform Grooming Flowsheets (Taken 04/24/2020 1210) Pt Will Perform Grooming:  with modified independence  sitting  standing Goal: Pt. Will Perform Upper Body Dressing Flowsheets (Taken 04/24/2020 1210) Pt Will Perform Upper Body Dressing:  with modified independence  sitting Goal: Pt. Will Perform Lower Body Dressing Flowsheets (Taken 04/24/2020 1210) Pt Will Perform Lower Body Dressing:  with min guard assist  sitting/lateral leans  sit to/from stand Goal: Pt. Will Transfer To Toilet Flowsheets (Taken 04/24/2020 1210) Pt Will Transfer to Toilet:  with supervision  ambulating  regular height toilet Goal: Pt. Will Perform Toileting-Clothing Manipulation Flowsheets (Taken 04/24/2020 1210) Pt Will Perform Toileting - Clothing Manipulation and hygiene:  with supervision  sitting/lateral leans  sit to/from stand Goal: Pt/Caregiver Will Perform Home Exercise Program Flowsheets (Taken 04/24/2020 1210) Pt/caregiver will Perform Home Exercise Program:  Increased strength  Both right and left upper extremity  Independently  With written HEP provided

## 2020-04-24 NOTE — Evaluation (Signed)
Physical Therapy Evaluation Patient Details Name: Amber Stephenson MRN: [id number] DOB: [birth year] Today's Date: 04/24/2020   History of Present Illness  Amber Stephenson  is a 74 y.o. female with past medical history relevant for obesity, OSA, COPD, history of CAD, chronic pain syndrome, chronic back pain and chronic A. fib on anticoagulation,  bipolar disorder with uncontrolled diabetes who presents to the ED with her husband with concerns about worsening back pain and ambulatory dysfunction and falls---As per patient's husband she has had back pain for about 12 years, worse over the last few years, she has been getting some injections lately-Over the last few weeks patient is gradually declining over the last week she has had significant difficulty when ambulating-She has not had physical therapy for a while now,    Clinical Impression  Patient exhibits generalized weakness, decreased activity tolerance, balance/coordination deficits, and difficulty with walking and experiences multiple episodes of LOB requiring physical support and assistance to correct.  Patient demonstrates high risk for falls and decreased knowledge in use of AD.  Patient would benefit from skilled PT services to increase functional independence and reduce risk for falls during mobility.  Patient will benefit from continued physical therapy in hospital and recommended venue below to increase strength, balance, endurance for safe ADLs and gait.    Follow Up Recommendations SNF;Supervision for mobility/OOB    Equipment Recommendations  Rolling walker with 5" wheels    Recommendations for Other Services       Precautions / Restrictions Precautions Precautions: Fall Restrictions Weight Bearing Restrictions: No      Mobility  Bed Mobility Overal bed mobility: Needs Assistance Bed Mobility: Rolling;Supine to Sit;Sit to Supine Rolling: Min assist Sidelying to sit: Min assist Supine to sit: Min assist Sit to supine:  Min assist        Transfers Overall transfer level: Needs assistance Equipment used: Rolling walker (2 wheeled) Transfers: Sit to/from [facility] Sit to Stand: Min assist Stand pivot transfers: Min assist          Ambulation/Gait Ambulation/Gait assistance: Min assist Gait Distance (Feet): 15 Feet Assistive device: Rolling walker (2 wheeled) Gait Pattern/deviations: Staggering left     General Gait Details: pt experiencing multiple episodes of LOB to the left requiring physical assist for support and correction  Stairs            Wheelchair Mobility    Modified Rankin (Stroke Patients Only)       Balance Overall balance assessment: Needs assistance Sitting-balance support: Single extremity supported Sitting balance-Leahy Scale: Fair     Standing balance support: Bilateral upper extremity supported Standing balance-Leahy Scale: Poor                               Pertinent Vitals/Pain Pain Assessment: Faces Faces Pain Scale: Hurts little more Pain Location: lower right lumbar Pain Descriptors / Indicators: Aching Pain Intervention(s): Limited activity within patient's tolerance;Monitored during session    [name] expects to be discharged to:: Private residence Living Arrangements: Spouse/significant other Available Help at Discharge: Family;Available 24 hours/day Type of Home: House Home Access: Stairs to enter Entrance Stairs-Rails: Left Entrance Stairs-Number of Steps: 5 Home Layout: One level Home Equipment: Shower seat - built in;Cane - single point;Grab bars - toilet;Grab bars - tub/shower Additional Comments: 3 wheeled walker    Prior Function Level of Independence: Needs assistance   Gait / Transfers Assistance Needed: Household and short distanced community  ambulator  ADL's / Homemaking Assistance Needed: 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        Hand  Dominance   Dominant Hand: Right    Extremity/Trunk Assessment   Upper Extremity Assessment Upper Extremity Assessment: Defer to OT evaluation    Lower Extremity Assessment Lower Extremity Assessment: Generalized weakness    Cervical / Trunk Assessment Cervical / Trunk Assessment: Normal  Communication   Communication: No difficulties  Cognition   Behavior During Therapy: Flat affect;Impulsive Overall Cognitive Status: Within Functional Limits for tasks assessed                                        General Comments      Exercises     Assessment/Plan    PT Assessment Patient needs continued PT services  PT Problem List Decreased strength;Decreased activity tolerance;Decreased balance;Decreased mobility;Decreased coordination;Decreased knowledge of use of DME;Decreased safety awareness;Decreased knowledge of precautions       PT Treatment Interventions DME instruction;Gait training;Stair training;Functional mobility training;Therapeutic activities;Patient/family education;Balance training;Therapeutic exercise    PT Goals (Current goals can be found in the Care Plan section)  Acute Rehab PT Goals Patient Stated Goal: Be able to walk PT Goal Formulation: With patient Time For Goal Achievement: 05/01/20 Potential to Achieve Goals: Good    Frequency Min 3X/week   Barriers to discharge        Co-evaluation PT/OT/SLP Co-Evaluation/Treatment: Yes Reason for Co-Treatment: For patient/therapist safety;To address functional/ADL transfers PT goals addressed during session: Mobility/safety with mobility;Balance;Proper use of DME OT goals addressed during session: ADL's and self-care;Proper use of Adaptive equipment and DME       AM-PAC PT "6 Clicks" Mobility  Outcome Measure Help needed turning from your back to your side while in a flat bed without using bedrails?: A Little Help needed moving from lying on your back to sitting on the side of a flat  bed without using bedrails?: A Little Help needed moving to and from a bed to a chair (including a wheelchair)?: A Little Help needed standing up from a chair using your arms (e.g., wheelchair or bedside chair)?: A Little Help needed to walk in hospital room?: A Lot Help needed climbing 3-5 steps with a railing? : Total 6 Click Score: 15    End of Session Equipment Utilized During Treatment: Gait belt Activity Tolerance: No increased pain;Patient tolerated treatment well Patient left: in chair;with call bell/phone within reach;with chair alarm set Nurse Communication: Mobility status;Precautions PT Visit Diagnosis: Unsteadiness on feet (R26.81);Other abnormalities of gait and mobility (R26.89);Repeated falls (R29.6);Muscle weakness (generalized) (M62.81);Difficulty in walking, not elsewhere classified (R26.2)    Time: 4270-6237 PT Time Calculation (min) (ACUTE ONLY): 20 min   Charges:   PT Evaluation $PT Eval Moderate Complexity: 1 Mod PT Treatments $Gait Training: 8-22 mins       11:00 AM, 04/24/20 M. Sherlyn Lees, PT, DPT Physical Therapist- [place] Office Number: [phone]

## 2020-04-24 NOTE — TOC Transition Note (Signed)
Transition of Care [facility]) - CM/SW Discharge Note   Patient Details  Name: DESTENI PISCOPO MRN: [id number] Date of Birth: 1945-07-10  [facility]) CM/SW Contact:  Shade Flood, LCSW Phone Number: 04/24/2020, 12:04 PM   Clinical Narrative:     Pt stable for dc today per MD. Damaris Schooner with pt's husband to update on SNF bed offers. Pt and her husband select [facility]. Kerri at [facility] states that they can take pt today. RN to call report. DC clinical sent electronically.   There are no other TOC needs for dc.   Final next level of care: Skilled Nursing Facility Barriers to Discharge: Barriers Resolved   Patient Goals and CMS Choice Patient states their goals for this hospitalization and ongoing recovery are:: Get stronger CMS Medicare.gov Compare Post Acute Care list provided to:: Patient Represenative (must comment) Choice offered to / list presented to : Spouse  Discharge Placement              Patient chooses bed at: [facility] Patient to be transferred to facility by: w/c Name of family member notified: Dwight Patient and family notified of of transfer: 04/24/20  Discharge Plan and Services In-house Referral: Clinical Social Work Discharge Planning Services: NA Post Acute Care Choice: [place]          DME Arranged: N/A DME Agency: NA       HH Arranged: NA HH Agency: NA        Social Determinants of Health (SDOH) Interventions     Readmission Risk Interventions No flowsheet data found.

## 2020-04-24 NOTE — Discharge Instructions (Signed)
1)You are taking Apixaban/Eliquis which is your blood thinner so please Avoid ibuprofen/Advil/Aleve/Motrin/Goody Powders/Naproxen/BC powders/Meloxicam/Diclofenac/Indomethacin and other Nonsteroidal anti-inflammatory medications as these will make you more likely to bleed and can cause stomach ulcers, can also cause Kidney problems.   2)insulin aspart (novoLOG) injection 0-10 Units 0-10 Units Subcutaneous, 3 times daily with meals CBG < 70: Implement Hypoglycemia Standing Orders and refer to Hypoglycemia Standing Orders sidebar report  CBG 70 - 120: 0 unit CBG 121 - 150: 0 unit  CBG 151 - 200: 1 unit CBG 201 - 250: 2 units CBG 251 - 300: 4 units CBG 301 - 350: 6 units  CBG 351 - 400: 8 units  CBG > 400: 10 units  3)Repeat CBC and BMP on Monday, 04/30/2020  4) avoid Tegretol/carbamazepine while on Eliquis

## 2020-04-24 NOTE — Evaluation (Signed)
Occupational Therapy Evaluation Patient Details Name: Amber Stephenson MRN: [id number] DOB: 12-02-1945 Today's Date: 04/24/2020    History of Present Illness Amber Stephenson  is a 74 y.o. female with past medical history relevant for obesity, OSA, COPD, history of CAD, chronic pain syndrome, chronic back pain and chronic A. fib on anticoagulation,  bipolar disorder with uncontrolled diabetes who presents to the ED with her husband with concerns about worsening back pain and ambulatory dysfunction and falls---As per patient's husband she has had back pain for about 12 years, worse over the last few years, she has been getting some injections lately-Over the last few weeks patient is gradually declining over the last week she has had significant difficulty when ambulating-She has not had physical therapy for a while now,   Clinical Impression   Pt seen with PT this am, reports requiring more assistance over the past month or so. Pt with generalized weakness and poor balance during standing tasks. Pt with left lean, mod assist to correct. Pt able to perform seated tasks with set-up to min assist, requiring increased assistance levels for LB tasks. Recommend SNF on discharge to improve safety and independence in ADLs and functional mobility.     Follow Up Recommendations  SNF    Equipment Recommendations  None recommended by OT       Precautions / Restrictions Precautions Precautions: Fall Restrictions Weight Bearing Restrictions: No      Mobility Bed Mobility Overal bed mobility: Needs Assistance Bed Mobility: Rolling;Supine to Sit;Sit to Supine Rolling: Min assist Sidelying to sit: Min assist Supine to sit: Min assist Sit to supine: Min assist        Transfers Overall transfer level: Needs assistance Equipment used: Rolling walker (2 wheeled) Transfers: Sit to/from [facility] Sit to Stand: Min assist Stand pivot transfers: Min assist                 ADL either performed or assessed with clinical judgement   ADL Overall ADL's : Needs assistance/impaired Eating/Feeding: Set up;Sitting Eating/Feeding Details (indicate cue type and reason): assist with opening milk carton Grooming: Set up;Sitting Grooming Details (indicate cue type and reason): unable to complete in standing due to poor balance Upper Body Bathing: Minimal assistance;Moderate assistance;Sitting   Lower Body Bathing: Moderate assistance;Maximal assistance;Sitting/lateral leans;Sit to/from stand   Upper Body Dressing : Minimal assistance;Sitting   Lower Body Dressing: Maximal assistance;Sitting/lateral leans   Toilet Transfer: Minimal assistance;Ambulation;RW   Toileting- Clothing Manipulation and Hygiene: Min guard;Sitting/lateral lean       Functional mobility during ADLs: Minimal assistance       Vision Baseline Vision/History: Wears glasses Wears Glasses: At all times Patient Visual Report: No change from baseline Vision Assessment?: No apparent visual deficits            Pertinent Vitals/Pain Pain Assessment: Faces Faces Pain Scale: Hurts little more Pain Location: lower right lumbar Pain Descriptors / Indicators: Aching Pain Intervention(s): Limited activity within patient's tolerance;Monitored during session;Repositioned     Hand Dominance Right   Extremity/Trunk Assessment Upper Extremity Assessment Upper Extremity Assessment: Generalized weakness   Lower Extremity Assessment Lower Extremity Assessment: Defer to PT evaluation   Cervical / Trunk Assessment Cervical / Trunk Assessment: Normal   Communication Communication Communication: No difficulties   Cognition Arousal/Alertness: Awake/alert Behavior During Therapy: Flat affect;Impulsive Overall Cognitive Status: Within Functional Limits for tasks assessed  Home Living Family/patient expects to be discharged to::  Private residence Living Arrangements: Spouse/significant other Available Help at Discharge: Family;Available 24 hours/day Type of Home: House Home Access: Stairs to enter [employer] of Steps: 5 Entrance Stairs-Rails: Left Home Layout: One level     Bathroom Shower/Tub: Occupational psychologist: Standard Bathroom Accessibility: Yes   Home Equipment: Shower seat - built in;Cane - single point;Grab bars - toilet;Grab bars - tub/shower   Additional Comments: 3 wheeled walker      Prior Functioning/Environment Level of Independence: Needs assistance  Gait / Transfers Assistance Needed: Household and short distanced community ambulator ADL's / Homemaking Assistance Needed: 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, extensive assist over the past month            OT Problem List: Decreased strength;Decreased activity tolerance;Impaired balance (sitting and/or standing);Decreased safety awareness;Decreased knowledge of use of DME or AE      OT Treatment/Interventions: Self-care/ADL training;Therapeutic exercise;DME and/or AE instruction;Therapeutic activities;Patient/family education    OT Goals(Current goals can be found in the care plan section) Acute Rehab OT Goals Patient Stated Goal: return home after rehab OT Goal Formulation: With patient Time For Goal Achievement: 05/08/20 Potential to Achieve Goals: Good  OT Frequency: Min 1X/week           Co-evaluation PT/OT/SLP Co-Evaluation/Treatment: Yes Reason for Co-Treatment: Complexity of the patient's impairments (multi-system involvement);For patient/therapist safety PT goals addressed during session: Mobility/safety with mobility;Balance;Proper use of DME OT goals addressed during session: ADL's and self-care;Proper use of Adaptive equipment and DME      AM-PAC OT "6 Clicks" Daily Activity     Outcome Measure Help from another person eating meals?: A Little Help from  another person taking care of personal grooming?: A Little Help from another person toileting, which includes using toliet, bedpan, or urinal?: A Little Help from another person bathing (including washing, rinsing, drying)?: A Lot Help from another person to put on and taking off regular upper body clothing?: A Little Help from another person to put on and taking off regular lower body clothing?: A Lot 6 Click Score: 16   End of Session Equipment Utilized During Treatment: Gait belt;Rolling walker  Activity Tolerance: Patient tolerated treatment well Patient left: in chair;with call bell/phone within reach;with chair alarm set  OT Visit Diagnosis: Muscle weakness (generalized) (M62.81);History of falling (Z91.81)                Time: 2924-4628 OT Time Calculation (min): 19 min Charges:  OT General Charges $OT Visit: 1 Visit OT Evaluation $OT Eval Low Complexity: Aneta, OTR/L  [phone] 04/24/2020, 12:07 PM

## 2020-04-24 NOTE — Plan of Care (Signed)

## 2020-04-24 NOTE — Plan of Care (Signed)
  Problem: Acute Rehab PT Goals(only PT should resolve) Goal: Pt Will Go Sit To Supine/Side Outcome: Progressing Flowsheets (Taken 04/24/2020 1102) Pt will go Sit to Supine/Side: with supervision Goal: Patient Will Transfer Sit To/From Stand Outcome: Progressing Flowsheets (Taken 04/24/2020 1102) Patient will transfer sit to/from stand: with supervision Goal: Pt Will Transfer Bed To Chair/Chair To Bed Outcome: Progressing Flowsheets (Taken 04/24/2020 1102) Pt will Transfer Bed to Chair/Chair to Bed: min guard assist Goal: Pt Will Ambulate Outcome: Progressing Flowsheets (Taken 04/24/2020 1102) Pt will Ambulate:  50 feet  with min guard assist  with rolling walker   11:03 AM, 04/24/20 M. Sherlyn Lees, PT, DPT Physical Therapist- [place] Office Number: [phone]

## 2020-04-24 NOTE — Care Management (Signed)
RE: Amber Stephenson DOB: 1946-01-15 Date: 04/24/2020 MUST ID: [id number]  To Whom It May Concern:  Please be advised that the above name patient will require a short-term nursing home stay--anticipated 30 days or less rehabilitation and strengthening. The plan is for return home.

## 2020-04-24 NOTE — NC FL2 (Signed)
[place] MEDICAID FL2 LEVEL OF CARE SCREENING TOOL     IDENTIFICATION  Patient Name: Amber Stephenson Birthdate: 1945-06-05 Sex: female Admission Date (Current Location): 04/23/2020  [facility] and [place] Number:  [employer] and Address:  [facility] [address] [place]      Provider Number: [id number]  Attending Physician Name and Address:  Roxan Hockey, MD  Relative Name and Phone Number:  Kierstynn Babich (husband) Ph: [phone]    Current Level of Care: Hospital Recommended Level of Care: [place] Prior Approval Number:    Date Approved/Denied:   PASRR Number:    Discharge Plan: SNF    Current Diagnoses: Patient Active Problem List   Diagnosis Date Noted  . 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 Fall at home, initial encounter 04/23/2020  . Ambulatory dysfunction--back pain and falls 04/23/2020  . Chronic pain syndrome 08/22/2019  . Nerve pain 08/22/2019  . Myofascial pain dysfunction syndrome 08/22/2019  . Dyspnea 08/22/2019  . CAD (coronary artery disease)   . Hypocalcemia   . S/P shoulder replacement, 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  . History of adenomatous polyp of colon 05/21/2019  . Polypharmacy 05/21/2019  . Benign paroxysmal positional vertigo due to bilateral vestibular disorder 05/20/2019  . Hyperlipidemia associated with type 2 diabetes mellitus ([place]) 05/20/2019  . Vertigo 04/25/2019  . 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[place]) 04/21/2019  . OSA (obstructive sleep apnea) 02/24/2019  . Allergic rhinitis 02/24/2019  . Acute on chronic respiratory failure with hypoxia ([place]) 01/22/2019  . 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 Morbid obesity ([place]) 07/06/2018  . Cardiomegaly 01/12/2018  . Non-alcoholic fatty liver disease 01/12/2018  . Mild intermittent asthma 01/12/2018  . Senile osteoporosis 12/15/2017  . Asthma  11/12/2017  . Fibromyalgia 03/19/2017  . Migraine headache with aura 02/12/2016  . Diabetic neuropathy associated with type 2 diabetes mellitus ([place]) 02/06/2016  . Bipolar 1 disorder, manic, moderate ([place]) 10/04/2015  . CHF (congestive heart failure) ([place]) 10/04/2015  . Depression, recurrent ([place]) 10/03/2015  . Herpes genitalis in women 07/16/2015  . Long term current use of anticoagulant 05/30/2015  . Family history of coronary arteriosclerosis 05/30/2015  . Chronic diastolic heart failure ([place]) 05/30/2015  . Dyspnea on exertion   . Hypertension associated with diabetes ([place])   . Restless legs 04/27/2015  . Lipoma 02/08/2015  . Bipolar disorder ([place]) 01/23/2015  . Insomnia 01/23/2015  . Chronic back pain 01/04/2015  . Chronic atrial fibrillation ([place]) 01/04/2015  . DM2 (diabetes mellitus, type 2) ([place])   . Hyperlipidemia     Orientation RESPIRATION BLADDER Height & Weight     Self, Time, Situation, Place  Normal Incontinent Weight: 180 lb 1.9 oz (81.7 kg) Height:  5\' 2"  (157.5 cm)  BEHAVIORAL SYMPTOMS/MOOD NEUROLOGICAL BOWEL NUTRITION STATUS      Continent Diet (Heart healthy)  AMBULATORY STATUS COMMUNICATION OF NEEDS Skin   Extensive Assist Verbally Normal                       Personal Care Assistance Level of Assistance  Bathing, Feeding, Dressing Bathing Assistance: Maximum assistance Feeding assistance: Independent Dressing Assistance: Limited assistance     Functional Limitations Info  Sight, Hearing, Speech Sight Info: Adequate Hearing Info: Adequate Speech Info: Adequate    SPECIAL CARE FACTORS FREQUENCY  PT (By licensed PT), OT (By licensed OT)     PT Frequency: 5x week  OT Frequency: 3x week            Contractures Contractures Info: Not present    Additional Factors Info  Code Status Code Status Info: full Allergies Info: Ivp Dye (Iodinated Diagnostic Agents); Latex           Current Medications (04/24/2020):  This is the current  hospital active medication list Current Facility-Administered Medications  Medication Dose Route Frequency Provider Last Rate Last Admin  . 0.9 %  sodium chloride infusion  250 mL Intravenous PRN Emokpae, Courage, MD      . 0.9 %  sodium chloride infusion   Intravenous Continuous [place] Brick, Courage, MD 20 mL/hr at 04/24/20 0803 Rate Change at 04/24/20 0803  . acetaminophen (TYLENOL) tablet 650 mg  650 mg Oral Q6H PRN Emokpae, Courage, MD       Or  . acetaminophen (TYLENOL) suppository 650 mg  650 mg Rectal Q6H PRN Emokpae, Courage, MD      . albuterol (PROVENTIL) (2.5 MG/3ML) 0.083% nebulizer solution 3 mL  3 mL Nebulization Q6H PRN Emokpae, Courage, MD      . apixaban (ELIQUIS) tablet 5 mg  5 mg Oral BID Denton Brick, Courage, MD   5 mg at 04/24/20 0802  . atorvastatin (LIPITOR) tablet 40 mg  40 mg Oral Daily Emokpae, Courage, MD   40 mg at 04/24/20 0807  . bisacodyl (DULCOLAX) suppository 10 mg  10 mg Rectal Daily PRN Emokpae, Courage, MD      . carbamazepine (TEGRETOL XR) 12 hr tablet 100 mg  100 mg Oral Daily Emokpae, Courage, MD   100 mg at 04/24/20 0801  . cholecalciferol (VITAMIN D3) tablet 1,000 Units  1,000 Units Oral Daily Roxan Hockey, MD   1,000 Units at 04/24/20 0801  . diltiazem (CARDIZEM CD) 24 hr capsule 120 mg  120 mg Oral QHS Emokpae, Courage, MD   120 mg at 04/23/20 2158  . insulin aspart (novoLOG) injection 0-15 Units  0-15 Units Subcutaneous TID WC Roxan Hockey, MD   2 Units at 04/24/20 0804  . insulin aspart (novoLOG) injection 0-5 Units  0-5 Units Subcutaneous QHS Roxan Hockey, MD   3 Units at 04/23/20 2208  . insulin aspart (novoLOG) injection 3 Units  3 Units Subcutaneous TID WC Emokpae, Courage, MD   3 Units at 04/24/20 0800  . insulin glargine (LANTUS) injection 40 Units  40 Units Subcutaneous BID Emokpae, Courage, MD      . LORazepam (ATIVAN) tablet 1 mg  1 mg Oral QHS PRN Emokpae, Courage, MD      . metoprolol tartrate (LOPRESSOR) tablet 50 mg  50 mg Oral BID  Denton Brick, Courage, MD   50 mg at 04/24/20 0802  . mometasone-formoterol (DULERA) 100-5 MCG/ACT inhaler 2 puff  2 puff Inhalation BID Roxan Hockey, MD   2 puff at 04/24/20 0727  . morphine 2 MG/ML injection 2 mg  2 mg Intravenous Q4H PRN Emokpae, Courage, MD      . multivitamin with minerals tablet 1 tablet  1 tablet Oral Daily Denton Brick, Courage, MD   1 tablet at 04/24/20 0801  . omega-3 acid ethyl esters (LOVAZA) capsule 2 g  2 g Oral BID Roxan Hockey, MD   2 g at 04/24/20 0803  . ondansetron (ZOFRAN) tablet 4 mg  4 mg Oral Q6H PRN Emokpae, Courage, MD       Or  . ondansetron (ZOFRAN) injection 4 mg  4 mg Intravenous Q6H PRN Denton Brick, Courage, MD      . oxyCODONE-acetaminophen (  PERCOCET/ROXICET) 5-325 MG per tablet 1 tablet  1 tablet Oral Q6H PRN Emokpae, Courage, MD      . polyethylene glycol (MIRALAX / GLYCOLAX) packet 17 g  17 g Oral Daily Emokpae, Courage, MD   17 g at 04/24/20 0801  . potassium chloride SA (KLOR-CON) CR tablet 40 mEq  40 mEq Oral Q3H Emokpae, Courage, MD   40 mEq at 04/24/20 0800  . pregabalin (LYRICA) capsule 200 mg  200 mg Oral BID Denton Brick, Courage, MD   200 mg at 04/24/20 0801  . sodium chloride flush (NS) 0.9 % injection 3 mL  3 mL Intravenous Q12H Emokpae, Courage, MD   3 mL at 04/23/20 2200  . sodium chloride flush (NS) 0.9 % injection 3 mL  3 mL Intravenous PRN Emokpae, Courage, MD      . SUMAtriptan (IMITREX) tablet 50 mg  50 mg Oral Q12H PRN Emokpae, Courage, MD      . topiramate (TOPAMAX) tablet 50 mg  50 mg Oral Daily Emokpae, Courage, MD   50 mg at 04/24/20 0802  . topiramate (TOPAMAX) tablet 75 mg  75 mg Oral QHS Roxan Hockey, MD   75 mg at 04/23/20 2158  . traZODone (DESYREL) tablet 50 mg  50 mg Oral QHS PRN Roxan Hockey, MD       Facility-Administered Medications Ordered in Other Encounters  Medication Dose Route Frequency Provider Last Rate Last Admin  . bupivacaine-epinephrine (MARCAINE W/ EPI) 0.5% -1:200000 injection    Anesthesia Intra-op  Effie Berkshire, MD   12 mL at 01/21/19 0935     Discharge Medications: Please see discharge summary for a list of discharge medications.  Relevant Imaging Results:  Relevant Lab Results:   Additional Information SSN: [id number]  Shade Flood, LCSW

## 2020-04-24 NOTE — Progress Notes (Signed)
Inpatient Diabetes Program Recommendations  AACE/ADA: New Consensus Statement on Inpatient Glycemic Control (2015)  Target Ranges:  Prepandial:   less than 140 mg/dL      Peak postprandial:   less than 180 mg/dL (1-2 hours)      Critically ill patients:  140 - 180 mg/dL   Lab Results  Component Value Date   GLUCAP 145 (H) 04/24/2020   HGBA1C 10.9 (H) 04/23/2020    Review of Glycemic Control Results for Amber Stephenson, Amber Stephenson (MRN [id number]) as of 04/24/2020 11:06  Ref. Range 04/23/2020 15:17 04/23/2020 16:52 04/23/2020 18:14 04/23/2020 20:19 04/24/2020 07:37  Glucose-Capillary Latest Ref Range: 70 - 99 mg/dL 324 (H) 262 (H) 206 (H) 270 (H) 145 (H)   Diabetes history:  T2DM Outpatient Diabetes medications:  Lantus 60 units bid Rybelsus 7 mg daily Current orders for Inpatient glycemic control:  Lantus 40 units bid Novolog 0-15 units tida & 0-5 qhs Novolog 3 units  tid with meals  Note: Spoke with patient on the phone.  She verified above home mediations and she states she takes them as prescribed.  She rotates her insulin administration sites.   Reviewed patient's current A1c of 10.9% (average blood sugar of 266 mg/dL). Explained what a A1c is and what it measures. Also reviewed goal A1c with patient, importance of good glucose control @ home, and blood sugar goals.  She states she does not limit CHO's and she drinks beverages with sugar.  She checks her blood sugar 1 x a day in the morning and it usually reads in the 200's.   She is current with her PCP.    Asked her to start eliminating beverages with sugar and stick to diets, zeros or water.  Also reviewed The Plate Method and importance on limiting CHO's.  Discussed long and short term complications of uncontrolled blood sugars.  Ordered Living Well with Diabetes booklet.    Will continue to follow while inpatient.  Thank you, Reche Dixon, RN, BSN Diabetes Coordinator Inpatient Diabetes Program [phone] (team pager from  8a-5p)

## 2020-04-24 NOTE — Discharge Summary (Signed)
Amber Stephenson, is a 74 y.o. female  DOB [birth year]  MRN [id number].  Admission date:  04/23/2020  Admitting Physician  Roxan Hockey, MD  Discharge Date:  04/24/2020   Primary MD  [name], MD  Recommendations for primary care physician for things to follow:   1)You are taking Apixaban/Eliquis which is your blood thinner so please Avoid ibuprofen/Advil/Aleve/Motrin/Goody Powders/Naproxen/BC powders/Meloxicam/Diclofenac/Indomethacin and other Nonsteroidal anti-inflammatory medications as these will make you more likely to bleed and can cause stomach ulcers, can also cause Kidney problems.   2)insulin aspart (novoLOG) injection 0-10 Units 0-10 Units Subcutaneous, 3 times daily with meals CBG < 70: Implement Hypoglycemia Standing Orders and refer to Hypoglycemia Standing Orders sidebar report  CBG 70 - 120: 0 unit CBG 121 - 150: 0 unit  CBG 151 - 200: 1 unit CBG 201 - 250: 2 units CBG 251 - 300: 4 units CBG 301 - 350: 6 units  CBG 351 - 400: 8 units  CBG > 400: 10 units  3)Repeat CBC and BMP on Monday, 04/30/2020  4) avoid Tegretol/carbamazepine while on Eliquis  Admission Diagnosis  Ataxia [R27.0] Back pain [M54.9] Weakness [R53.1]   Discharge Diagnosis  Ataxia [R27.0] Back pain [M54.9] Weakness [R53.1]    Principal Problem:   Ambulatory dysfunction--back pain and falls Active Problems:   Back pain/sacroiliitis--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 Atrial fibrillation with RVR 05/19/2017   . Bipolar I disorder 01/23/2015  . CAD (coronary artery disease)    Nonobstructive; Managed by Dr. Bronson Ing  . Cardiomegaly 01/12/2018  . Chronic anticoagulation 05/30/2015  . Chronic back pain    Lower back  . Chronic diastolic CHF (congestive heart failure) 05/30/2015  . Diabetes mellitus, type 2, without complication   . Diabetic neuropathy 02/06/2016  . Dysrhythmia    A-Fib  . Essential hypertension   . Herpes genitalis in women 07/16/2015  . Hyperlipidemia   . Hypertension   . Hypoxia 02/01/2019  . Insomnia 01/23/2015  . Lipoma 02/08/2015  . Major depressive disorder   . Migraine headache with aura 02/12/2016  . 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[place]) 04/21/2019  . NAFL (nonalcoholic fatty liver) 3/64/6803  . OSA (obstructive sleep apnea) 02/24/2019   10/09/2018 - HST  - AHI 40.6   . Pulmonary hypertension   . Renal insufficiency    Managed by Dr. Wallace Keller  . RLS (restless legs syndrome) 04/27/2015    Past Surgical History:  Procedure Laterality Date  . BREAST REDUCTION SURGERY    . EYE SURGERY Right    cateracts  . HAMMER TOE SURGERY    .  LEFT HEART CATH AND CORONARY ANGIOGRAPHY N/A 02/03/2018   Procedure: LEFT HEART CATH AND CORONARY ANGIOGRAPHY;  Surgeon: Martinique, Peter M, MD;  Location: [facility];  Service: Cardiovascular;  Laterality: N/A;  . REVERSE SHOULDER ARTHROPLASTY Right 08/19/2019   Procedure: REVERSE SHOULDER ARTHROPLASTY;  Surgeon: Netta Cedars, MD;  Location: [facility];  Service: Orthopedics;  Laterality: Right;  interscalene block  . SHOULDER SURGERY Right    "I BROKE MY SHOUDLER  . THIGH SURGERY     "TO REMOVE A TUMOR "     HPI  from the history and physical done on the day of admission:    Amber Stephenson  is a 74 y.o. female with past medical history relevant for obesity, OSA, COPD, history of CAD, chronic pain syndrome, chronic back pain and chronic A. fib on anticoagulation,  bipolar disorder with uncontrolled diabetes who presents to the ED  with her husband with concerns about worsening back pain and ambulatory dysfunction and falls -- -As per patient's husband she has had back pain for about 12 years, worse over the last few years, she has been getting some injections lately -Over the last few weeks patient is gradually declining over the last week she has had significant difficulty when ambulating -She has not had physical therapy for a while now, -In the ED - MRI brain without acute findings MRI C-spine and thoracic spine without acute findings, MRI lumbar spine with --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)- --EDP discussed with neurosurgeon who advised no intervention -EDP discussed with neurologist who advised medical management and therapy --In the ED patient is also found to have  glucose of 612, bicarb is 25 and anion gap is 12 -Received IV insulin in the ED okay to transition to subcu insulin and IV fluids -Use Novolog/Humalog Sliding scale insulin with Accu-Cheks/Fingersticks as ordered   -leukocytosis noted however no evidence of significant infection at this time may be reactive  No fever  Or chills  -No chest pains or ACS type symptoms - -ambulatory dysfunction recurrent falls unsafe discharge plan at this time    Hospital Course:      1)Ambulatory Dysfunction and Falls at home--- MRI brain without acute findings MRI C-spine thoracic spine without acute findings, MRI lumbar spine with --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)- --EDP discussed with neurosurgeon who advised no intervention -EDP discussed with neurologist who advised medical management and therapy --ambulatory dysfunction and  recurrent falls unsafe discharge plan at this time - PT  eval appreciated, recommends SNF rehab  2) uncontrolled DM 2 with severe Hyperglycemia--A1c is 10.9 reflecting uncontrolled DM with hyperglycemia -Admitted this time  with glucose of 612, bicarb is 25 and anion gap is 12 -Received IV insulin in the ED  -transitioned to subcu insulin  -Diabetic educator consult appreciated, discharged on Lantus insulin 50 units twice daily along with  Novolog/Humalog Sliding scale insulin with Accu-Cheks/Fingersticks as ordered   3)Back pain/sacroiliitis--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)--- please see #1 above -Be judicious with opiates  4) leukocytosis--no evidence of significant infection at this time may be reactive -Patient was on steroid therapy -WBC down to 11.2 from 12.3  5) bipolar disorder--- stable, continue Topamax, lorazepam as needed  6) chronic atrial fibrillation--continue Cardizem and metoprolol for rate control Eliquis for stroke prophylaxis  7) chronic pain syndrome--be judicious  with opiates, continue Topamax, Lyrica   8) COPD--- stable, continue bronchodilators currently not requiring oxygen  9) history of CAD--no ACS type symptoms at this time, continue metoprolol, patient already on Eliquis so do not give aspirin, continue Lipitor  Disposition/--- discharge to SNF rehab   dispo: The patient is from: Home  Anticipated d/c is to: SNF   With History of - Reviewed by me   Discharge Condition: stable  Follow UP   Contact information for after-discharge care    [facility] .   Service: Skilled Nursing Contact information: [address] [place] [phone]                   Consults obtained -   Diet and Activity recommendation:  As advised  Discharge Instructions    Discharge Instructions    Call MD for:  difficulty breathing, headache or visual disturbances   Complete by: As directed    Call MD for:  persistant dizziness or light-headedness   Complete by: As directed    Call MD for:  persistant nausea and vomiting    Complete by: As directed    Call MD for:  severe uncontrolled pain   Complete by: As directed    Call MD for:  temperature >100.4   Complete by: As directed    Diet - low sodium heart healthy   Complete by: As directed    Diet Carb Modified   Complete by: As directed    Discharge instructions   Complete by: As directed    1)You are taking Apixaban/Eliquis which is your blood thinner so please Avoid ibuprofen/Advil/Aleve/Motrin/Goody Powders/Naproxen/BC powders/Meloxicam/Diclofenac/Indomethacin and other Nonsteroidal anti-inflammatory medications as these will make you more likely to bleed and can cause stomach ulcers, can also cause Kidney problems.   2)insulin aspart (novoLOG) injection 0-10 Units 0-10 Units Subcutaneous, 3 times daily with meals CBG < 70: Implement Hypoglycemia Standing Orders and refer to Hypoglycemia Standing Orders sidebar report  CBG 70 - 120: 0 unit CBG 121 - 150: 0 unit  CBG 151 - 200: 1 unit CBG 201 - 250: 2 units CBG 251 - 300: 4 units CBG 301 - 350: 6 units  CBG 351 - 400: 8 units  CBG > 400: 10 units  3)Repeat CBC and BMP on Monday, 04/30/2020   Increase activity slowly   Complete by: As directed         Discharge Medications     Allergies as of 04/24/2020      Reactions   Ivp Dye [iodinated Diagnostic Agents] Anaphylaxis, Swelling   Throat closes   Latex    Latex tape pulls skin with it      Medication List    STOP taking these medications   carbamazepine 100 MG 12 hr capsule Commonly known as: CARBATROL   diclofenac Sodium 1 % Gel Commonly known as: VOLTAREN   fluconazole 150 MG tablet Commonly known as: DIFLUCAN   flurazepam 30 MG capsule Commonly known as: DALMANE   meclizine 12.5 MG tablet Commonly known as: ANTIVERT   predniSONE 10 MG tablet Commonly known as: DELTASONE   SUMAtriptan 100 MG tablet Commonly known as: IMITREX   tiZANidine 4 MG capsule Commonly known as: ZANAFLEX     TAKE these medications   acetaminophen  325 MG tablet Commonly known as: TYLENOL Take 2 tablets (650 mg total) by mouth every 6 (six) hours as needed for mild pain, fever or  headache (or Fever >/= 101). What changed:   how much to take  reasons to take this   albuterol (2.5 MG/3ML) 0.083% nebulizer solution Commonly known as: PROVENTIL Take 3 mLs (2.5 mg total) by nebulization every 6 (six) hours as needed for wheezing or shortness of breath.   ProAir HFA 108 (90 Base) MCG/ACT inhaler Generic drug: albuterol Inhale 1-2 puffs into the lungs every 6 (six) hours as needed. As needed   ARIPiprazole 10 MG tablet Commonly known as: ABILIFY Take 1 tablet (10 mg total) by mouth once for 1 dose.   atorvastatin 40 MG tablet Commonly known as: LIPITOR Take 1 tablet (40 mg total) by mouth daily.   budesonide-formoterol 80-4.5 MCG/ACT inhaler Commonly known as: Symbicort Inhale 2 puffs into the lungs 2 (two) times daily. What changed:   when to take this  reasons to take this  additional instructions   diltiazem 120 MG 24 hr capsule Commonly known as: CARDIZEM CD TAKE 1 CAPSULE AT BEDTIME   Eliquis 5 MG Tabs tablet Generic drug: apixaban TAKE 1 TABLET TWICE A DAY What changed: how much to take   escitalopram 10 MG tablet Commonly known as: Lexapro Take 1 tablet (10 mg total) by mouth daily.   furosemide 40 MG tablet Commonly known as: LASIX TAKE 1 TABLET EVERY MORNING  (DOSE INCREASED) What changed: See the new instructions.   insulin aspart 100 UNIT/ML FlexPen Commonly known as: NOVOLOG Inject 0-10 Units into the skin 3 (three) times daily with meals. insulin aspart (novoLOG) injection 0-10 Units 0-10 Units Subcutaneous, 3 times daily with meals CBG < 70: Implement Hypoglycemia Standing Orders and refer to Hypoglycemia Standing Orders sidebar report  CBG 70 - 120: 0 unit CBG 121 - 150: 0 unit  CBG 151 - 200: 1 unit CBG 201 - 250: 2 units CBG 251 - 300: 4 units CBG 301 - 350: 6 units  CBG 351 - 400: 8 units  CBG  > 400: 10 units   Lantus SoloStar 100 UNIT/ML Solostar Pen Generic drug: insulin glargine INJECT 50 UNITS UNDER THE SKIN TWICE A DAY What changed: additional instructions   LORazepam 1 MG tablet Commonly known as: Ativan Take 1-2 tablets (1-2 mg total) by mouth See admin instructions. Take 1 tab (1 mg) in the morning and 2 tabs (2 mg ) at bedtime   metoprolol tartrate 50 MG tablet Commonly known as: LOPRESSOR Take 1 tablet (50 mg total) by mouth 2 (two) times daily. What changed:   medication strength  how much to take  how to take this  when to take this  additional instructions   multivitamin with minerals Tabs tablet Take 1 tablet by mouth daily. Start taking on: April 25, 2020   nystatin powder Commonly known as: MYCOSTATIN/NYSTOP Apply topically 3 (three) times daily. x2 weeks (for groin/breast rash)   oxyCODONE-acetaminophen 5-325 MG tablet Commonly known as: PERCOCET/ROXICET Take 1 tablet by mouth every 6 (six) hours as needed for moderate pain or severe pain. for pain What changed: reasons to take this   polyethylene glycol 17 g packet Commonly known as: MIRALAX / GLYCOLAX Take 17 g by mouth daily. Start taking on: April 25, 2020   potassium chloride SA 20 MEQ tablet Commonly known as: KLOR-CON Take 1 tablet (20 mEq total) by mouth daily.   pregabalin 200 MG capsule Commonly known as: Lyrica Take 1 capsule (200 mg total) by mouth 2 (two) times daily.   PREVAGEN PO Take 1 tablet by mouth daily.  rizatriptan 10 MG tablet Commonly known as: Maxalt Take 1 tablet at onset of migraine. Do not take more than 2-3 a week   Rybelsus 7 MG Tabs Generic drug: Semaglutide Take 7 mg by mouth daily.   topiramate 25 MG tablet Commonly known as: TOPAMAX Take 3 tablets (75 mg total) by mouth at bedtime. What changed:   how much to take  how to take this  when to take this  additional instructions   triamcinolone ointment 0.5 % Commonly known  as: KENALOG Apply 1 application topically 2 (two) times daily as needed (skin irritation/rash.).   Vascepa 1 g capsule Generic drug: icosapent Ethyl TAKE 2 CAPSULES TWICE A DAY WITH MEALS What changed: See the new instructions.   Vitamin D3 25 MCG (1000 UT) Caps Take 1,000 Units by mouth daily.       Major procedures and Radiology Reports - PLEASE review detailed and final reports for all details, in brief -   CT Head Wo Contrast  Result Date: 04/23/2020 CLINICAL DATA:  Dizziness after fall EXAM: CT HEAD WITHOUT CONTRAST TECHNIQUE: Contiguous axial images were obtained from the base of the skull through the vertex without intravenous contrast. COMPARISON:  August 10, 2017. FINDINGS: Brain: Ventricles and sulci appear within normal limits for age. There is no intracranial mass, hemorrhage, extra-axial fluid collection, or midline shift. Minimal small vessel disease in the centra semiovale is stable. No acute infarct evident. Vascular: No hyperdense vessel. Calcification noted in the carotid siphon regions. Skull: Bony calvarium appears intact. Sinuses/Orbits: There is mucosal thickening in several ethmoid air cells. Other visualized paranasal sinuses are clear. Orbits appear symmetric bilaterally. Other: Mastoid air cells are clear. IMPRESSION: Minimal periventricular small vessel disease. No acute infarct. No mass or hemorrhage. Foci of arterial vascular calcification noted. Mucosal thickening noted in several ethmoid air cells. Electronically Signed   By: Lowella Grip III M.D.   On: 04/23/2020 10:59   MR BRAIN WO CONTRAST  Result Date: 04/23/2020 CLINICAL DATA:  Dizziness. EXAM: MRI HEAD WITHOUT CONTRAST TECHNIQUE: Multiplanar, multiecho pulse sequences of the brain and surrounding structures were obtained without intravenous contrast. COMPARISON:  CT head April 23, 2020. FINDINGS: Brain: No acute infarction, hemorrhage, hydrocephalus, extra-axial collection or mass lesion. Scattered  T2/FLAIR hyperintensities within the white matter, compatible with chronic microvascular ischemic disease and appropriate for patient's age. Mild diffuse cerebral atrophy. Vascular: Major arterial flow voids are maintained at the skull base. Skull and upper cervical spine: Normal marrow signal. Sinuses/Orbits: Mild ethmoid air cell mucosal thickening. Remaining sinuses are clear. Unremarkable orbits. Other: No mastoid effusions. IMPRESSION: No evidence of acute intracranial abnormality. Specifically, no acute infarct. Electronically Signed   By: Margaretha Sheffield MD   On: 04/23/2020 13:33   MR Cervical Spine Wo Contrast  Result Date: 04/23/2020 CLINICAL DATA:  Cervical radiculopathy, infection suspected EXAM: MRI CERVICAL SPINE WITHOUT CONTRAST TECHNIQUE: Multiplanar, multisequence MR imaging of the cervical spine was performed. No intravenous contrast was administered. COMPARISON:  None. FINDINGS: Alignment: Straightening of lordosis. Minimal grade 1 C5-6 and C6-7 retrolisthesis. Vertebrae: Normal bone marrow signal intensity. No focal osseous lesion. Cord: Normal signal and morphology. Posterior Fossa, vertebral arteries: Please see same day MRI head for findings above the foramen magnum. Disc levels: Multilevel desiccation with mild disc space loss. C2-3: Bilateral facet degenerative spurring. Patent spinal canal and neural foramen. C3-4: Small disc osteophyte complex with uncovertebral and facet hypertrophy. Patent spinal canal and neural foramen. C4-5: Disc osteophyte complex with superimposed left paracentral protrusion abutting  the ventral cord. Uncovertebral and facet degenerative spurring. Patent spinal canal and neural foramen. C5-6: Disc osteophyte complex with superimposed central protrusion abutting the ventral cord. Uncovertebral and facet degenerative spurring. Patent spinal canal and neural foramen. C6-7: Disc osteophyte complex with uncovertebral and facet hypertrophy. Patent spinal canal and  right neural foramen. Mild left neural foraminal narrowing. C7-T1: No significant disc bulge, spinal canal or neural foraminal narrowing. Paraspinal tissues: Negative. IMPRESSION: No evidence of cervical spine infection. Multilevel spondylosis. Patent spinal canal. Mild left C6-7 neural foraminal narrowing. Central/paracentral protrusions at the C4-5 and C5-6 level abutting the ventral cord. No evidence of myelomalacia. Electronically Signed   By: Primitivo Gauze M.D.   On: 04/23/2020 17:06   MR THORACIC SPINE WO CONTRAST  Result Date: 04/23/2020 CLINICAL DATA:  Mid back pain, neuro deficit, ataxia. EXAM: MRI THORACIC SPINE WITHOUT CONTRAST TECHNIQUE: Multiplanar, multisequence MR imaging of the thoracic spine was performed. No intravenous contrast was administered. COMPARISON:  Concurrent MRI cervical spine. FINDINGS: Alignment:  Normal. Vertebrae: No focal osseous lesion. Modic type 2 endplate degenerative changes involving the inferior T9 endplate. Chronic superior T11 endplate deformity. Schmorl's node formation at the T11 and T12 levels. Remaining vertebral body heights are preserved. Cord:  Normal signal and morphology. Paraspinal and other soft tissues: 4 mm right T1-2 perineural cyst. Disc levels: T10-11 left paracentral protrusion partially effacing the ventral CSF containing spaces. Patent spinal canal and neural foramen. Multilevel facet degenerative spurring. IMPRESSION: No evidence of infection. Mild degenerative changes and chronic superior T11 endplate deformity with minimal height loss. Shallow left T10-11 paracentral protrusion. Patent spinal canal and neural foramen. Electronically Signed   By: Primitivo Gauze M.D.   On: 04/23/2020 17:15   MR LUMBAR SPINE WO CONTRAST  Result Date: 04/23/2020 CLINICAL DATA:  Low back pain. Unable to walk. Bilateral leg weakness. EXAM: MRI LUMBAR SPINE WITHOUT CONTRAST TECHNIQUE: Multiplanar, multisequence MR imaging of the lumbar spine was  performed. No intravenous contrast was administered. COMPARISON:  MRI lumbar spine 03/27/2019. FINDINGS: Segmentation:  Standard. Alignment:  Physiologic. Vertebrae: No evidence of acute fracture, discitis/osteomyelitis, or suspicious bone lesion. Degenerative Schmorl's nodes. Conus medullaris and cauda equina: Conus extends to the L1-L2 level. Conus appears normal. Approximately 5 mm round mass within the dorsal spinal canal at L2 (see series 14, image 8 and series 17, image 8). Paraspinal and other soft tissues: Unremarkable. Disc levels: T12-L1: No significant disc protrusion, foraminal stenosis, or canal stenosis. L1-L2: Small broad-based disc bulge with tiny right paracentral disc protrusion, similar to prior. Mild bilateral facet hypertrophy. No significant canal or foraminal stenosis. L2-L3: Similar mild broad-based disc bulge with moderate right and mild left facet hypertrophy. No significant canal or foraminal stenosis. L3-L4: Similar right eccentric broad-based disc bulge with moderate right and mild left facet hypertrophy. No significant canal or foraminal stenosis. Mild right subarticular recess stenosis. L4-L5: Similar broad-based disc bulge with small broad left foraminal disc protrusion and moderate bilateral facet hypertrophy. Mild left foraminal and left subarticular recess stenosis. No significant canal stenosis. L5-S1: No significant disc bulge. Similar bilateral facet hypertrophy. No evidence of significant canal or foraminal stenosis. IMPRESSION: 1. 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 2. Similar multilevel degenerative change without significant central canal stenosis. Similar mild left foraminal stenosis at L4-L5. Electronically Signed   By: Margaretha Sheffield  MD   On: 04/23/2020 13:28    MR LUMBAR SPINE W CONTRAST  Result Date: 04/23/2020 CLINICAL DATA:  Infection suspected, radiculopathy. EXAM: MRI LUMBAR SPINE WITH CONTRAST CONTRAST:  49mL GADAVIST GADOBUTROL 1 MMOL/ML IV SOLN COMPARISON:  Prior same day MRI lumbar spine without contrast. FINDINGS: Segmentation:  Standard. Alignment:  Normal. Vertebrae: Normal bone marrow signal intensity. No enhancing osseous lesions. Conus medullaris and cauda equina: Conus extends to the L1-2 level. Conus and cauda equina appear normal. 5-6 mm rounded mass within the dorsal thecal sac at the L2 level demonstrates mild homogeneous enhancement (5:9). Disc levels: Degenerative changes are better evaluated on prior same day noncontrast MRI. Paraspinal and other soft tissues: Negative. 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 No evidence of spinal infection. Electronically Signed   By: Primitivo Gauze M.D.   On: 04/23/2020 17:27    Micro Results   Recent Results (from the past 240 hour(s))  Resp Panel by RT-PCR (Flu A&B, Covid) Nasopharyngeal Swab     Status: None   Collection Time: 04/23/20 10:08 AM   Specimen: Nasopharyngeal Swab; Nasopharyngeal(NP) swabs in vial transport medium  Result Value Ref Range Status   SARS Coronavirus 2 by RT PCR NEGATIVE NEGATIVE Final    Comment: (NOTE) SARS-CoV-2 target nucleic acids are NOT DETECTED.  The SARS-CoV-2 RNA is generally detectable in upper respiratory specimens during the acute phase of infection. The lowest concentration of SARS-CoV-2 viral copies this assay can detect is 138 copies/mL. A negative result does not preclude SARS-Cov-2 infection and should not be used as the sole basis for treatment or other patient management decisions. A negative result may occur with  improper specimen collection/handling, submission of specimen other than nasopharyngeal swab, presence of viral mutation(s) within the areas targeted by this  assay, and inadequate number of viral copies(<138 copies/mL). A negative result must be combined with clinical observations, patient history, and epidemiological information. The expected result is Negative.  Fact Sheet for Patients:  EntrepreneurPulse.com.au  Fact Sheet for Healthcare Providers:  IncredibleEmployment.be  This test is no t yet approved or cleared by the [place] FDA and  has been authorized for detection and/or diagnosis of SARS-CoV-2 by FDA under an Emergency Use Authorization (EUA). This EUA will remain  in effect (meaning this test can be used) for the duration of the COVID-19 declaration under Section 564(b)(1) of the Act, 21 U.S.C.section 360bbb-3(b)(1), unless the authorization is terminated  or revoked sooner.       Influenza A by PCR NEGATIVE NEGATIVE Final   Influenza B by PCR NEGATIVE NEGATIVE Final    Comment: (NOTE) The Xpert Xpress SARS-CoV-2/FLU/RSV plus assay is intended as an aid in the diagnosis of influenza from Nasopharyngeal swab specimens and should not be used as a sole basis for treatment. Nasal washings and aspirates are unacceptable for Xpert Xpress SARS-CoV-2/FLU/RSV testing.  Fact Sheet for Patients: [employer]  Fact Sheet for Healthcare Providers: IncredibleEmployment.be  This test is not yet approved or cleared by the [place] FDA and has been authorized for detection and/or diagnosis of SARS-CoV-2 by FDA under an Emergency Use Authorization (EUA). This EUA will remain in effect (meaning this test can be used) for the duration of the COVID-19 declaration under Section 564(b)(1) of the Act, 21 U.S.C. section 360bbb-3(b)(1), unless the authorization is terminated or revoked.  Performed at [facility], [address] [place], [place] [address]      Today   Subjective  Amber Stephenson today has no new complaints      No fever   Or chills   No Nausea, Vomiting or Diarrhea --Back pain persist        Patient has been seen and examined prior to discharge   Objective   Blood pressure 118/81, pulse 84, temperature 98.6 F (37 C), resp. rate 18, height 5\' 2"  (1.575 m), weight 81.7 kg, SpO2 98 %.   Intake/Output Summary (Last 24 hours) at 04/24/2020 1159 Last data filed at 04/24/2020 0935 Gross per 24 hour  Intake 983 ml  Output 100 ml  Net 883 ml    Exam Physical Examination: General appearance - alert, chronically ill  appearing,  Mental status - alert, oriented to person, place, and time, Eyes - sclera anicteric Neck - supple, no JVD elevation , Chest - clear  to auscultation bilaterally, symmetrical air movement,  Heart - S1 and S2 normal, irregularly irregular Abdomen - soft, nontender, nondistended, no masses or organomegaly Neurological - screening mental status exam normal, neck supple without rigidity, cranial nerves II through XII intact, patient with significant ambulatory dysfunction, unsteady gait, bilateral lower extremity weakness, limited range of motion lumbar spine due to pain and unsteadiness Extremities - no pedal edema noted, intact peripheral pulses  Skin - warm, dry    Data Review   CBC w Diff:  Lab Results  Component Value Date   WBC 11.2 (H) 04/24/2020   HGB 13.5 04/24/2020   HGB 13.8 11/29/2019   HCT 40.0 04/24/2020   HCT 41.0 11/29/2019   PLT 188 04/24/2020   PLT 196 11/29/2019   LYMPHOPCT 27 04/23/2020   MONOPCT 5 04/23/2020   EOSPCT 0 04/23/2020   BASOPCT 0 04/23/2020    CMP:  Lab Results  Component Value Date   NA 134 (L) 04/24/2020   NA 140 11/29/2019   K 3.3 (L) 04/24/2020   CL 102 04/24/2020   CO2 25 04/24/2020   BUN 27 (H) 04/24/2020   BUN 13 11/29/2019   CREATININE 0.75 04/24/2020   PROT 7.1 04/23/2020   PROT 6.8 11/29/2019   ALBUMIN 3.9 04/23/2020   ALBUMIN 4.1 11/29/2019   BILITOT 0.7 04/23/2020   BILITOT 0.3 11/29/2019   ALKPHOS 157 (H)  04/23/2020   AST 15 04/23/2020   ALT 17 04/23/2020  .   Total Discharge time is about 33 minutes  Roxan Hockey M.D on 04/24/2020 at 11:59 AM  Go to [email or web address] -  for contact info  [facility] - Office  [phone]

## 2020-04-25 ENCOUNTER — Other Ambulatory Visit: Payer: Self-pay | Admitting: Adult Health

## 2020-04-25 ENCOUNTER — Non-Acute Institutional Stay (SKILLED_NURSING_FACILITY): Payer: Medicare Other | Admitting: Adult Health

## 2020-04-25 ENCOUNTER — Encounter: Payer: Self-pay | Admitting: Adult Health

## 2020-04-25 DIAGNOSIS — M5442 Lumbago with sciatica, left side: Secondary | ICD-10-CM | POA: Diagnosis not present

## 2020-04-25 DIAGNOSIS — E785 Hyperlipidemia, unspecified: Secondary | ICD-10-CM

## 2020-04-25 DIAGNOSIS — J4520 Mild intermittent asthma, uncomplicated: Secondary | ICD-10-CM

## 2020-04-25 DIAGNOSIS — E1143 Type 2 diabetes mellitus with diabetic autonomic (poly)neuropathy: Secondary | ICD-10-CM

## 2020-04-25 DIAGNOSIS — R262 Difficulty in walking, not elsewhere classified: Secondary | ICD-10-CM

## 2020-04-25 DIAGNOSIS — I482 Chronic atrial fibrillation, unspecified: Secondary | ICD-10-CM

## 2020-04-25 DIAGNOSIS — M797 Fibromyalgia: Secondary | ICD-10-CM

## 2020-04-25 DIAGNOSIS — E1142 Type 2 diabetes mellitus with diabetic polyneuropathy: Secondary | ICD-10-CM

## 2020-04-25 DIAGNOSIS — F3130 Bipolar disorder, current episode depressed, mild or moderate severity, unspecified: Secondary | ICD-10-CM

## 2020-04-25 DIAGNOSIS — E1165 Type 2 diabetes mellitus with hyperglycemia: Secondary | ICD-10-CM

## 2020-04-25 DIAGNOSIS — E1169 Type 2 diabetes mellitus with other specified complication: Secondary | ICD-10-CM

## 2020-04-25 DIAGNOSIS — E1159 Type 2 diabetes mellitus with other circulatory complications: Secondary | ICD-10-CM

## 2020-04-25 DIAGNOSIS — I5032 Chronic diastolic (congestive) heart failure: Secondary | ICD-10-CM

## 2020-04-25 DIAGNOSIS — G43109 Migraine with aura, not intractable, without status migrainosus: Secondary | ICD-10-CM

## 2020-04-25 DIAGNOSIS — G894 Chronic pain syndrome: Secondary | ICD-10-CM

## 2020-04-25 DIAGNOSIS — J9611 Chronic respiratory failure with hypoxia: Secondary | ICD-10-CM | POA: Insufficient documentation

## 2020-04-25 DIAGNOSIS — K5909 Other constipation: Secondary | ICD-10-CM

## 2020-04-25 DIAGNOSIS — M5441 Lumbago with sciatica, right side: Secondary | ICD-10-CM

## 2020-04-25 DIAGNOSIS — F3112 Bipolar disorder, current episode manic without psychotic features, moderate: Secondary | ICD-10-CM

## 2020-04-25 DIAGNOSIS — IMO0002 Reserved for concepts with insufficient information to code with codable children: Secondary | ICD-10-CM

## 2020-04-25 DIAGNOSIS — G8929 Other chronic pain: Secondary | ICD-10-CM

## 2020-04-25 DIAGNOSIS — I152 Hypertension secondary to endocrine disorders: Secondary | ICD-10-CM

## 2020-04-25 HISTORY — DX: Other constipation: K59.09

## 2020-04-25 HISTORY — DX: Chronic respiratory failure with hypoxia: J96.11

## 2020-04-25 MED ORDER — PREGABALIN 200 MG PO CAPS: 200.0000 mg | ORAL_CAPSULE | Freq: Two times a day (BID) | ORAL | 0 refills | Status: DC

## 2020-04-25 MED ORDER — LORAZEPAM 1 MG PO TABS
1.0000 mg | ORAL_TABLET | ORAL | 0 refills | Status: DC
Start: 2020-04-25 — End: 2020-04-25

## 2020-04-25 MED ORDER — OXYCODONE-ACETAMINOPHEN 5-325 MG PO TABS: 1.0000 | ORAL_TABLET | Freq: Four times a day (QID) | ORAL | 0 refills | Status: DC | PRN

## 2020-04-25 MED ORDER — LORAZEPAM 1 MG PO TABS
1.0000 mg | ORAL_TABLET | ORAL | 0 refills | Status: DC
Start: 2020-04-25 — End: 2020-04-30

## 2020-04-25 NOTE — Progress Notes (Signed)
Location:    [facility] Room Number: 158/P Place of Service:  SNF (31)   CODE STATUS: Full Code  Allergies  Allergen Reactions  . Ivp Dye [Iodinated Diagnostic Agents] Anaphylaxis and Swelling    Throat closes   . Latex     Latex tape pulls skin with it    Chief Complaint  Patient presents with  . Hospitalization Follow-up    Hospitalization Follow Up    HPI:  She is a 74 year old woman who has been hospitalized from 04-23-20 through 04-24-20. She has chronic back pain. She has had increased weakness inability to ambulate with falls for the past several weeks prior to her hospitalization. She was hospitalized for her weakness. She is here for short term rehab with her goal to return back home. Her cbg readings remain elevated at 300 and above. She denies any pain at this time. She denies any constipation. She will continue to be followed for her chronic illnesses including: diabetes; hypertension; atrial fibrillation.   Past Medical History:  Diagnosis Date  . Atrial fibrillation with RVR 05/19/2017  . Bipolar I disorder 01/23/2015  . CAD (coronary artery disease)    Nonobstructive; Managed by Dr. Bronson Ing  . Cardiomegaly 01/12/2018  . Chronic anticoagulation 05/30/2015  . Chronic back pain    Lower back  . Chronic diastolic CHF (congestive heart failure) 05/30/2015  . Diabetes mellitus, type 2, without complication   . Diabetic neuropathy 02/06/2016  . Dysrhythmia    A-Fib  . Essential hypertension   . Herpes genitalis in women 07/16/2015  . Hyperlipidemia   . Hypertension   . Hypoxia 02/01/2019  . Insomnia 01/23/2015  . Lipoma 02/08/2015  . Major depressive disorder   . Migraine headache with aura 02/12/2016  . 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[place]) 04/21/2019  . NAFL (nonalcoholic fatty liver) 2/33/4356  . OSA (obstructive sleep apnea) 02/24/2019   10/09/2018 - HST  - AHI 40.6   . Pulmonary hypertension   . Renal insufficiency    Managed  by Dr. Wallace Keller  . RLS (restless legs syndrome) 04/27/2015    Past Surgical History:  Procedure Laterality Date  . BREAST REDUCTION SURGERY    . EYE SURGERY Right    cateracts  . HAMMER TOE SURGERY    . LEFT HEART CATH AND CORONARY ANGIOGRAPHY N/A 02/03/2018   Procedure: LEFT HEART CATH AND CORONARY ANGIOGRAPHY;  Surgeon: Martinique, Peter M, MD;  Location: [facility];  Service: Cardiovascular;  Laterality: N/A;  . REVERSE SHOULDER ARTHROPLASTY Right 08/19/2019   Procedure: REVERSE SHOULDER ARTHROPLASTY;  Surgeon: Netta Cedars, MD;  Location: [facility];  Service: Orthopedics;  Laterality: Right;  interscalene block  . SHOULDER SURGERY Right    "I BROKE MY SHOUDLER  . THIGH SURGERY     "TO REMOVE A TUMOR "    Social History   Socioeconomic History  . Marital status: Married    Spouse name: Orpah Greek  . Number of children: 3  . Years of education: 12  . Highest education level: Bachelor's degree (e.g., BA, AB, BS)  Occupational History  . Occupation: Retired    Comment: marketing  Tobacco Use  . Smoking status: Never Smoker  . Smokeless tobacco: Never Used  Vaping Use  . Vaping Use: Never used  Substance and Sexual Activity  . Alcohol use: Not Currently    Alcohol/week: 0.0 standard drinks  . Drug use: No  . Sexual activity: Yes    Partners: Male  Birth control/protection: Post-menopausal  Other Topics Concern  . Not on file  Social History Narrative   Lives at home with husband.       Right handed   Social Determinants of Health   Financial Resource Strain:   . Difficulty of Paying Living Expenses: Not on file  Food Insecurity:   . Worried About Charity fundraiser in the Last Year: Not on file  . Ran Out of Food in the Last Year: Not on file  Transportation Needs:   . Lack of Transportation (Medical): Not on file  . Lack of Transportation (Non-Medical): Not on file  Physical Activity:   . Days of Exercise per Week: Not on file  . Minutes of Exercise per  Session: Not on file  Stress:   . Feeling of Stress : Not on file  Social Connections:   . Frequency of Communication with Friends and Family: Not on file  . Frequency of Social Gatherings with Friends and Family: Not on file  . Attends Religious Services: Not on file  . Active Member of Clubs or Organizations: Not on file  . Attends Archivist Meetings: Not on file  . Marital Status: Not on file  Intimate Partner Violence:   . Fear of Current or Ex-Partner: Not on file  . Emotionally Abused: Not on file  . Physically Abused: Not on file  . Sexually Abused: Not on file   Family History  Problem Relation Age of Onset  . Diabetes Mother   . Heart disease Mother   . Heart disease Brother   . Heart disease Sister        CABG  . Diabetes Sister   . Alzheimer's disease Father   . Alcohol abuse Sister   . Mental illness Brother   . Diabetes Brother       VITAL SIGNS BP 113/64   Pulse 72   Temp (!) 97.5 F (36.4 C)   Resp 20   Ht 5\' 2"  (1.575 m)   Wt 189 lb 1.6 oz (85.8 kg)   SpO2 96%   BMI 34.59 kg/m   Facility-Administered Encounter Medications as of 04/25/2020  Medication  . bupivacaine-epinephrine (MARCAINE W/ EPI) 0.5% -1:200000 injection   Outpatient Encounter Medications as of 04/25/2020  Medication Sig  . acetaminophen (TYLENOL) 325 MG tablet Take 2 tablets (650 mg total) by mouth every 6 (six) hours as needed for mild pain, fever or headache (or Fever >/= 101).  Marland Kitchen albuterol (PROVENTIL) (2.5 MG/3ML) 0.083% nebulizer solution Take 3 mLs (2.5 mg total) by nebulization every 6 (six) hours as needed for wheezing or shortness of breath.  Marland Kitchen albuterol (VENTOLIN HFA) 108 (90 Base) MCG/ACT inhaler Inhale 1 puff into the lungs every 6 (six) hours as needed for wheezing or shortness of breath.  Marland Kitchen atorvastatin (LIPITOR) 40 MG tablet Take 1 tablet (40 mg total) by mouth daily.  . budesonide-formoterol (SYMBICORT) 80-4.5 MCG/ACT inhaler Inhale 2 puffs into the lungs  2 (two) times daily.  . Cholecalciferol (VITAMIN D3) 1000 UNITS CAPS Take 1,000 Units by mouth daily.  Marland Kitchen diltiazem (CARDIZEM CD) 120 MG 24 hr capsule TAKE 1 CAPSULE AT BEDTIME  . ELIQUIS 5 MG TABS tablet TAKE 1 TABLET TWICE A DAY  . escitalopram (LEXAPRO) 10 MG tablet Take 1 tablet (10 mg total) by mouth daily.  . furosemide (LASIX) 40 MG tablet TAKE 1 TABLET EVERY MORNING  (DOSE INCREASED)  . insulin aspart (NOVOLOG) 100 UNIT/ML injection Inject 5 Units into  the skin 3 (three) times daily before meals. Special Instructions: Give Five units with meals if accu-check >/= 150.  Marland Kitchen insulin glargine (LANTUS SOLOSTAR) 100 UNIT/ML Solostar Pen INJECT 50 UNITS UNDER THE SKIN TWICE A DAY  . LORazepam (ATIVAN) 1 MG tablet Take 1-2 tablets (1-2 mg total) by mouth See admin instructions. Take 1 tab (1 mg) in the morning and 2 tabs (2 mg ) at bedtime  . metoprolol tartrate (LOPRESSOR) 50 MG tablet Take 1 tablet (50 mg total) by mouth 2 (two) times daily.  . Multiple Vitamin (MULTIVITAMIN WITH MINERALS) TABS tablet Take 1 tablet by mouth daily.  . NON FORMULARY Diet: _____ Regular, ___x___ NAS, ___x____Consistent Carbohydrate, _______NPO _____Other  . nystatin (MYCOSTATIN/NYSTOP) powder Apply topically 3 (three) times daily. x2 weeks (for groin/breast rash)  . oxyCODONE-acetaminophen (PERCOCET/ROXICET) 5-325 MG tablet Take 1 tablet by mouth every 6 (six) hours as needed for up to 5 days for moderate pain or severe pain. for pain  . polyethylene glycol (MIRALAX / GLYCOLAX) 17 g packet Take 17 g by mouth daily.  . potassium chloride SA (KLOR-CON) 20 MEQ tablet Take 1 tablet (20 mEq total) by mouth daily.  . pregabalin (LYRICA) 200 MG capsule Take 1 capsule (200 mg total) by mouth 2 (two) times daily.  . rizatriptan (MAXALT) 10 MG tablet Take 1 tablet at onset of migraine. Do not take more than 2-3 a week  . Semaglutide (RYBELSUS) 7 MG TABS Take 7 mg by mouth daily.   Marland Kitchen topiramate (TOPAMAX) 25 MG tablet Take 3  tablets (75 mg total) by mouth at bedtime.  Marland Kitchen VASCEPA 1 g capsule TAKE 2 CAPSULES TWICE A DAY WITH MEALS     SIGNIFICANT DIAGNOSTIC EXAMS  TODAY  04-23-20: ct of head:  Minimal periventricular small vessel disease. No acute infarct. No mass or hemorrhage. Foci of arterial vascular calcification noted. Mucosal thickening noted in several ethmoid air cells.  04-23-20: mri of lumbar spine:  1. 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 2. Similar multilevel degenerative change without significant central canal stenosis. Similar mild left foraminal stenosis at L4-L5.  04-23-20: mri of brain:  No acute infarction, hemorrhage, hydrocephalus, extra-axial collection or mass lesion. Scattered T2/FLAIR hyperintensities within the white matter, compatible with chronic microvascular ischemic disease and appropriate for patient's age. Mild diffuse cerebral atrophy  04-23-20 mri cervical spine:  No evidence of cervical spine infection. Multilevel spondylosis. Patent spinal canal. Mild left C6-7 neural foraminal narrowing. Central/paracentral protrusions at the C4-5 and C5-6 level abutting the ventral cord. No evidence of myelomalacia.  04-22-20: mri thoracic spine:  No evidence of infection. Mild degenerative changes and chronic superior T11 endplate deformity with minimal height loss. Shallow left T10-11 paracentral protrusion. Patent spinal canal and neural foramen.  LABS REVIEWED TODAY;   08-29-19: chol 128; ldl 71; trig 249; hdl 42 04-23-20: wbc 12.3; hgb 14.5; hct 41.9 mcv 90.1 plt 218; glucose 612; bun 35; creat 1.09; k+ 4.3; na++ 130; ca 9.4 liver normal albumin 3.9 hgb a1c 10.9 04-24-20: wbc 11.2; hgb 13.5; hct 40.0; mcv 91.1 plt 188; glucose 227; bun 27; creat 0.75; k+ 3.3; na++ 134; ca  8.4    Review of Systems  Constitutional: Negative for malaise/fatigue.  Respiratory: Negative for cough and shortness of breath.   Cardiovascular: Negative for chest pain, palpitations  and leg swelling.  Gastrointestinal: Negative for abdominal pain, constipation and heartburn.  Musculoskeletal: Negative for back pain, joint pain and myalgias.  Skin: Negative.   Neurological: Negative for dizziness.  Psychiatric/Behavioral: The patient is not nervous/anxious.     Physical Exam Constitutional:      General: She is not in acute distress.    Appearance: She is well-developed. She is obese. She is not diaphoretic.  Neck:     Thyroid: No thyromegaly.  Cardiovascular:     Rate and Rhythm: Normal rate and regular rhythm.     Pulses: Normal pulses.     Heart sounds: Normal heart sounds.  Pulmonary:     Effort: Pulmonary effort is normal. No respiratory distress.     Breath sounds: Normal breath sounds.  Abdominal:     General: Bowel sounds are normal. There is no distension.     Palpations: Abdomen is soft.     Tenderness: There is no abdominal tenderness.  Musculoskeletal:     Cervical back: Neck supple.     Right lower leg: No edema.     Left lower leg: No edema.     Comments: Is able to move all extremities Bilateral lower extremity weakness present   Lymphadenopathy:     Cervical: No cervical adenopathy.  Skin:    General: Skin is warm and dry.  Neurological:     Mental Status: She is alert and oriented to person, place, and time.  Psychiatric:        Mood and Affect: Mood normal.       ASSESSMENT/ PLAN:  TODAY  1.  Chronic bilateral low back pain with bilateral sciatica / ambulatory dysfunction back pain and falls/chronic pain syndrome/fibromyalgia: is without change in status. lyrica 200 mg twice daily has percocet 5/325 mg every 6 hours as needed through 04-30-20.   2. Bipolar affective disorder current manic, moderate: is presently stable: will continue  lexapro 10 mg daily ativan 1 mg in the AM and 2 mg in the PM. Her abilify was stopped on 04-19-20.   3.  Migraine with aura without status migrainosus not intractable: is stable will continue topamax 75 mg nightly has maxalt 10 mg as needed for acute migraine  4. Hypertension associated with diabetes: is stable 113/64 will continue lopressor 50 mg twice daily cardizem cd 120 mg daily   5. Chronic atrial fibrillation: heart rate is stable will continue cardizem cd 120 mg daily and lopressor 50 mg twice daily for rate control; will continue eliquis 5 mg twice daily   6. Chronic diastolic congestive heart failure: is stable EF 55-60% (01-22-19) will continue lasix 40 mg daily with k+ 20 meq daily is on lopressor 50 mg twice daily   7. Diabetic polyneuropathy associated with type 2 diabetes mellitus: is stable will continue lyrica 200 mg twice daily  8. Hyperlipidemia associated with type 2 diabetes mellitus: is stable LDL 71 trig 249 will continue lipitor 40 mg daily vascepa 2 gm   9. Uncontrolled type 2 diabetes mellitus with peripheral autonomic neuropathy: hgb a1c 10.9. her cbg readings remain elevated. Will stop rybelsus as this medication is probably not providing her any benefit will begin metformin xl 500 mg daily for insulin resistance; will increase lantus to 55 units twice daily and will change novolog to 10 units with meals  10. Chronic respiratory with hypoxia/mild intermittent asthma without complication: is stable will continue symbicort 80/4.5 mcg 2 puffs twice daily has albuterol neb or 1 puff every 6 hours  as needed   11. Chronic constipation: is stable will continue miralax daily   Will check k+ level    MD is aware of resident's narcotic use and is in agreement with current plan of care. We will attempt to wean resident as appropriate.  Ok Edwards NP [facility]  Contact [phone] Monday through Friday 8am- 5pm  After hours call [phone]

## 2020-04-30 ENCOUNTER — Non-Acute Institutional Stay (SKILLED_NURSING_FACILITY): Payer: Medicare Other | Admitting: Adult Health

## 2020-04-30 ENCOUNTER — Encounter: Payer: Self-pay | Admitting: Adult Health

## 2020-04-30 ENCOUNTER — Encounter: Payer: Medicare Other | Admitting: Physical Medicine and Rehabilitation

## 2020-04-30 ENCOUNTER — Encounter (HOSPITAL_COMMUNITY)
Admission: RE | Admit: 2020-04-30 | Discharge: 2020-04-30 | Disposition: A | Discharge status: Home / Self Care | Payer: Medicare Other | Source: Skilled Nursing Facility | Attending: Adult Health | Admitting: Adult Health

## 2020-04-30 DIAGNOSIS — G894 Chronic pain syndrome: Secondary | ICD-10-CM | POA: Diagnosis not present

## 2020-04-30 DIAGNOSIS — M797 Fibromyalgia: Secondary | ICD-10-CM | POA: Diagnosis not present

## 2020-04-30 DIAGNOSIS — I5032 Chronic diastolic (congestive) heart failure: Secondary | ICD-10-CM | POA: Insufficient documentation

## 2020-04-30 LAB — POTASSIUM: Potassium: 3.9 mmol/L (ref 3.5–5.1)

## 2020-04-30 NOTE — Progress Notes (Signed)
Location:    [facility] Room Number: 158/P Place of Service:  SNF (31)   CODE STATUS: Full Code  Allergies  Allergen Reactions  . Ivp Dye [Iodinated Diagnostic Agents] Anaphylaxis and Swelling    Throat closes   . Latex     Latex tape pulls skin with it    Chief Complaint  Patient presents with  . Acute Visit    Pain Management    HPI:  She presently has percocet 5/325 mg every 6 hours as needed for pain for her fibromyalgia and chronic pain. She is using this medication up to 2 times daily.  We have discussed the pros and cons of long term use of narcotic pain medications. She has verbalized understanding of addiction; overdose issues and side effects. She is willing to try to lower this medication to twice daily as needed. I did instruct her that we will be attempting further weans in the future.    Past Medical History:  Diagnosis Date  . Atrial fibrillation with RVR 05/19/2017  . Bipolar I disorder 01/23/2015  . CAD (coronary artery disease)    Nonobstructive; Managed by Dr. Bronson Ing  . Cardiomegaly 01/12/2018  . Chronic anticoagulation 05/30/2015  . Chronic back pain    Lower back  . Chronic diastolic CHF (congestive heart failure) 05/30/2015  . Diabetes mellitus, type 2, without complication   . Diabetic neuropathy 02/06/2016  . Dysrhythmia    A-Fib  . Essential hypertension   . Herpes genitalis in women 07/16/2015  . Hyperlipidemia   . Hypertension   . Hypoxia 02/01/2019  . Insomnia 01/23/2015  . Lipoma 02/08/2015  . Major depressive disorder   . Migraine headache with aura 02/12/2016  . 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[place]) 04/21/2019  . NAFL (nonalcoholic fatty liver) 4/74/2595  . OSA (obstructive sleep apnea) 02/24/2019   10/09/2018 - HST  - AHI 40.6   . Pulmonary hypertension   . Renal insufficiency    Managed by Dr. Wallace Keller  . RLS (restless legs syndrome) 04/27/2015    Past Surgical History:  Procedure Laterality Date  .  BREAST REDUCTION SURGERY    . EYE SURGERY Right    cateracts  . HAMMER TOE SURGERY    . LEFT HEART CATH AND CORONARY ANGIOGRAPHY N/A 02/03/2018   Procedure: LEFT HEART CATH AND CORONARY ANGIOGRAPHY;  Surgeon: Martinique, Peter M, MD;  Location: [facility];  Service: Cardiovascular;  Laterality: N/A;  . REVERSE SHOULDER ARTHROPLASTY Right 08/19/2019   Procedure: REVERSE SHOULDER ARTHROPLASTY;  Surgeon: Netta Cedars, MD;  Location: [facility];  Service: Orthopedics;  Laterality: Right;  interscalene block  . SHOULDER SURGERY Right    "I BROKE MY SHOUDLER  . THIGH SURGERY     "TO REMOVE A TUMOR "    Social History   Socioeconomic History  . Marital status: Married    Spouse name: Orpah Greek  . Number of children: 3  . Years of education: 80  . Highest education level: Bachelor's degree (e.g., BA, AB, BS)  Occupational History  . Occupation: Retired    Comment: marketing  Tobacco Use  . Smoking status: Never Smoker  . Smokeless tobacco: Never Used  Vaping Use  . Vaping Use: Never used  Substance and Sexual Activity  . Alcohol use: Not Currently    Alcohol/week: 0.0 standard drinks  . Drug use: No  . Sexual activity: Yes    Partners: Male    Birth control/protection: Post-menopausal  Other Topics Concern  .  Not on file  Social History Narrative   Lives at home with husband.       Right handed   Social Determinants of Health   Financial Resource Strain:   . Difficulty of Paying Living Expenses: Not on file  Food Insecurity:   . Worried About Charity fundraiser in the Last Year: Not on file  . Ran Out of Food in the Last Year: Not on file  Transportation Needs:   . Lack of Transportation (Medical): Not on file  . Lack of Transportation (Non-Medical): Not on file  Physical Activity:   . Days of Exercise per Week: Not on file  . Minutes of Exercise per Session: Not on file  Stress:   . Feeling of Stress : Not on file  Social Connections:   . Frequency of Communication  with Friends and Family: Not on file  . Frequency of Social Gatherings with Friends and Family: Not on file  . Attends Religious Services: Not on file  . Active Member of Clubs or Organizations: Not on file  . Attends Archivist Meetings: Not on file  . Marital Status: Not on file  Intimate Partner Violence:   . Fear of Current or Ex-Partner: Not on file  . Emotionally Abused: Not on file  . Physically Abused: Not on file  . Sexually Abused: Not on file   Family History  Problem Relation Age of Onset  . Diabetes Mother   . Heart disease Mother   . Heart disease Brother   . Heart disease Sister        CABG  . Diabetes Sister   . Alzheimer's disease Father   . Alcohol abuse Sister   . Mental illness Brother   . Diabetes Brother       VITAL SIGNS BP 93/63   Pulse 84   Temp (!) 96.9 F (36.1 C)   Resp 20   Ht 5\' 2"  (1.575 m)   Wt 189 lb (85.7 kg)   BMI 34.57 kg/m   Facility-Administered Encounter Medications as of 04/30/2020  Medication  . bupivacaine-epinephrine (MARCAINE W/ EPI) 0.5% -1:200000 injection   Outpatient Encounter Medications as of 04/30/2020  Medication Sig  . acetaminophen (TYLENOL) 325 MG tablet Take 2 tablets (650 mg total) by mouth every 6 (six) hours as needed for mild pain, fever or headache (or Fever >/= 101).  Marland Kitchen albuterol (PROVENTIL) (2.5 MG/3ML) 0.083% nebulizer solution Take 3 mLs (2.5 mg total) by nebulization every 6 (six) hours as needed for wheezing or shortness of breath.  Marland Kitchen albuterol (VENTOLIN HFA) 108 (90 Base) MCG/ACT inhaler Inhale 1 puff into the lungs every 6 (six) hours as needed for wheezing or shortness of breath.  Marland Kitchen atorvastatin (LIPITOR) 40 MG tablet Take 1 tablet (40 mg total) by mouth daily.  . budesonide-formoterol (SYMBICORT) 80-4.5 MCG/ACT inhaler Inhale 2 puffs into the lungs 2 (two) times daily.  . Cholecalciferol (VITAMIN D3) 1000 UNITS CAPS Take 1,000 Units by mouth daily.  Marland Kitchen diltiazem (CARDIZEM CD) 120 MG 24  hr capsule TAKE 1 CAPSULE AT BEDTIME  . ELIQUIS 5 MG TABS tablet TAKE 1 TABLET TWICE A DAY  . escitalopram (LEXAPRO) 10 MG tablet Take 1 tablet (10 mg total) by mouth daily.  . furosemide (LASIX) 40 MG tablet TAKE 1 TABLET EVERY MORNING  (DOSE INCREASED)  . insulin aspart (NOVOLOG) 100 UNIT/ML injection Inject 10 Units into the skin 3 (three) times daily before meals. Special Instructions: hold for cbg <120  .  insulin glargine (LANTUS SOLOSTAR) 100 UNIT/ML Solostar Pen Inject 55 Units into the skin 2 (two) times daily.  Marland Kitchen LORazepam (ATIVAN) 1 MG tablet Take 1 mg by mouth in the morning. And 2 mg by mouth at bedtime  . metFORMIN (GLUCOPHAGE) 500 MG tablet Take 500 mg by mouth daily.  . metoprolol tartrate (LOPRESSOR) 50 MG tablet Take 1 tablet (50 mg total) by mouth 2 (two) times daily.  . Multiple Vitamin (MULTIVITAMIN WITH MINERALS) TABS tablet Take 1 tablet by mouth daily.  . NON FORMULARY Diet: _____ Regular, ___x___ NAS, ___x____Consistent Carbohydrate, _______NPO _____Other  . nystatin (MYCOSTATIN/NYSTOP) powder Apply topically 3 (three) times daily. x2 weeks (for groin/breast rash)  . oxyCODONE-acetaminophen (PERCOCET/ROXICET) 5-325 MG tablet Take 1 tablet by mouth 2 (two) times daily as needed for severe pain.  . polyethylene glycol (MIRALAX / GLYCOLAX) 17 g packet Take 17 g by mouth daily.  . potassium chloride SA (KLOR-CON) 20 MEQ tablet Take 1 tablet (20 mEq total) by mouth daily.  . pregabalin (LYRICA) 200 MG capsule Take 1 capsule (200 mg total) by mouth 2 (two) times daily.  . rizatriptan (MAXALT) 10 MG tablet Take 10 mg by mouth daily as needed for migraine. Take one tablet at onset of migraine. Do not take more than 3 tabs max per week.  . topiramate (TOPAMAX) 25 MG tablet Take 3 tablets (75 mg total) by mouth at bedtime.  Marland Kitchen VASCEPA 1 g capsule TAKE 2 CAPSULES TWICE A DAY WITH MEALS  . [DISCONTINUED] insulin glargine (LANTUS SOLOSTAR) 100 UNIT/ML Solostar Pen INJECT 50 UNITS  UNDER THE SKIN TWICE A DAY  . [DISCONTINUED] LORazepam (ATIVAN) 1 MG tablet Take 1-2 tablets (1-2 mg total) by mouth See admin instructions. Take 1 tab (1 mg) in the morning and 2 tabs (2 mg ) at bedtime  . [DISCONTINUED] oxyCODONE-acetaminophen (PERCOCET/ROXICET) 5-325 MG tablet Take 1 tablet by mouth every 6 (six) hours as needed for up to 5 days for moderate pain or severe pain. for pain  . [DISCONTINUED] rizatriptan (MAXALT) 10 MG tablet Take 1 tablet at onset of migraine. Do not take more than 2-3 a week  . [DISCONTINUED] Semaglutide (RYBELSUS) 7 MG TABS Take 7 mg by mouth daily.      SIGNIFICANT DIAGNOSTIC EXAMS  PREVIOUS   04-23-20: ct of head:  Minimal periventricular small vessel disease. No acute infarct. No mass or hemorrhage. Foci of arterial vascular calcification noted. Mucosal thickening noted in several ethmoid air cells.  04-23-20: mri of lumbar spine:  1. 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 2. Similar multilevel degenerative change without significant central canal stenosis. Similar mild left foraminal stenosis at L4-L5.  04-23-20: mri of brain:  No acute infarction, hemorrhage, hydrocephalus, extra-axial collection or mass lesion. Scattered T2/FLAIR hyperintensities within the white matter, compatible with chronic microvascular ischemic disease and appropriate for patient's age. Mild diffuse cerebral atrophy  04-23-20 mri cervical spine:  No evidence of cervical spine infection. Multilevel spondylosis. Patent spinal canal. Mild left C6-7 neural foraminal narrowing. Central/paracentral protrusions at the C4-5 and C5-6 level abutting the ventral cord. No evidence of myelomalacia.  04-22-20: mri thoracic spine:  No evidence of infection. Mild degenerative changes  and chronic superior T11 endplate deformity with minimal height loss. Shallow left T10-11 paracentral protrusion. Patent spinal canal and neural foramen.  NO NEW EXAMS   LABS REVIEWED PREVIOUS    08-29-19: chol 128; ldl 71; trig 249; hdl 42 04-23-20: wbc 12.3; hgb 14.5; hct 41.9 mcv 90.1 plt 218; glucose 612; bun 35; creat 1.09; k+ 4.3; na++ 130; ca 9.4 liver normal albumin 3.9 hgb a1c 10.9 04-24-20: wbc 11.2; hgb 13.5; hct 40.0; mcv 91.1 plt 188; glucose 227; bun 27; creat 0.75; k+ 3.3; na++ 134; ca 8.4   TODAY  04-30-20: k+ 3.9.   Review of Systems  Constitutional: Negative for malaise/fatigue.  Respiratory: Negative for cough and shortness of breath.   Cardiovascular: Negative for chest pain, palpitations and leg swelling.  Gastrointestinal: Negative for abdominal pain, constipation and heartburn.  Musculoskeletal: Negative for back pain, joint pain and myalgias.  Skin: Negative.   Neurological: Negative for dizziness.  Psychiatric/Behavioral: The patient is not nervous/anxious.     Physical Exam Constitutional:      General: She is not in acute distress.    Appearance: She is well-developed. She is obese. She is not diaphoretic.  Neck:     Thyroid: No thyromegaly.  Cardiovascular:     Rate and Rhythm: Normal rate and regular rhythm.     Pulses: Normal pulses.     Heart sounds: Normal heart sounds.  Pulmonary:     Effort: Pulmonary effort is normal. No respiratory distress.     Breath sounds: Normal breath sounds.  Abdominal:     General: Bowel sounds are normal. There is no distension.     Palpations: Abdomen is soft.     Tenderness: There is no abdominal tenderness.  Musculoskeletal:     Cervical back: Neck supple.     Right lower leg: No edema.     Left lower leg: No edema.     Comments: Is able to move all extremities Mild bilateral lower extremity weakness present    Lymphadenopathy:     Cervical: No cervical adenopathy.  Skin:    General: Skin is warm and  dry.  Neurological:     Mental Status: She is alert and oriented to person, place, and time.  Psychiatric:        Mood and Affect: Mood normal.       ASSESSMENT/ PLAN:  TODAY  1. Fibromyalgia 2. Chronic pain syndrome  Is stable  Will lower her percocet 5/325 mg twice daily as needed and will re-evaluate on 05-07-20.     MD is aware of resident's narcotic use and is in agreement with current plan of care. We will attempt to wean resident as appropriate.  Ok Edwards NP [facility]  Contact [phone] Monday through Friday 8am- 5pm  After hours call [phone]

## 2020-05-02 ENCOUNTER — Encounter: Payer: Self-pay | Admitting: Adult Health

## 2020-05-02 ENCOUNTER — Non-Acute Institutional Stay (SKILLED_NURSING_FACILITY): Payer: Medicare Other | Admitting: Adult Health

## 2020-05-02 DIAGNOSIS — F3112 Bipolar disorder, current episode manic without psychotic features, moderate: Secondary | ICD-10-CM | POA: Diagnosis not present

## 2020-05-02 DIAGNOSIS — M797 Fibromyalgia: Secondary | ICD-10-CM

## 2020-05-02 DIAGNOSIS — R262 Difficulty in walking, not elsewhere classified: Secondary | ICD-10-CM | POA: Diagnosis not present

## 2020-05-02 DIAGNOSIS — G894 Chronic pain syndrome: Secondary | ICD-10-CM

## 2020-05-02 NOTE — Progress Notes (Signed)
Location:    [facility] Room Number: 158/P Place of Service:  SNF (31)   CODE STATUS: Full Code  Allergies  Allergen Reactions  . Ivp Dye [Iodinated Diagnostic Agents] Anaphylaxis and Swelling    Throat closes   . Latex     Latex tape pulls skin with it    Chief Complaint  Patient presents with  . Short Term Rehab (STR)          Chronic bilateral low back pain with bilateral sciatica/ambulatory dysfunction back pain and falls/chornic pain syndrome/fibromyalgia:    Bipolar affective disorder current manic moderate:   Migraine with aura without status migrainosus non intractable  Weekly follow up for the first 30 days post hospitalization.     HPI:  She is a 74 year old short term rehab patient being seen for the management of her chronic illnesses:Chronic bilateral low back pain with bilateral sciatica/ambulatory dysfunction back pain and falls/chornic pain syndrome/fibromyalgia:    Bipolar affective disorder current manic moderate:   Migraine with aura without status migrainosus non intractable. Her pain is presently being managed. She continues to participate in therapy. There are no reports of changes in appetite.    Past Medical History:  Diagnosis Date  . Atrial fibrillation with RVR 05/19/2017  . Bipolar I disorder 01/23/2015  . CAD (coronary artery disease)    Nonobstructive; Managed by Dr. Bronson Ing  . Cardiomegaly 01/12/2018  . Chronic anticoagulation 05/30/2015  . Chronic back pain    Lower back  . Chronic diastolic CHF (congestive heart failure) 05/30/2015  . Diabetes mellitus, type 2, without complication   . Diabetic neuropathy 02/06/2016  . Dysrhythmia    A-Fib  . Essential hypertension   . Herpes genitalis in women 07/16/2015  . Hyperlipidemia   . Hypertension   . Hypoxia 02/01/2019  . Insomnia 01/23/2015  . Lipoma 02/08/2015  . Major depressive disorder   . Migraine headache with aura 02/12/2016  . Mild vascular neurocognitive disorder  ([place]) 04/21/2019  . NAFL (nonalcoholic fatty liver) 0/93/2355  . OSA (obstructive sleep apnea) 02/24/2019   10/09/2018 - HST  - AHI 40.6   . Pulmonary hypertension   . Renal insufficiency    Managed by Dr. Wallace Keller  . RLS (restless legs syndrome) 04/27/2015    Past Surgical History:  Procedure Laterality Date  . BREAST REDUCTION SURGERY    . EYE SURGERY Right    cateracts  . HAMMER TOE SURGERY    . LEFT HEART CATH AND CORONARY ANGIOGRAPHY N/A 02/03/2018   Procedure: LEFT HEART CATH AND CORONARY ANGIOGRAPHY;  Surgeon: Martinique, Peter M, MD;  Location: [facility];  Service: Cardiovascular;  Laterality: N/A;  . REVERSE SHOULDER ARTHROPLASTY Right 08/19/2019   Procedure: REVERSE SHOULDER ARTHROPLASTY;  Surgeon: Netta Cedars, MD;  Location: [facility];  Service: Orthopedics;  Laterality: Right;  interscalene block  . SHOULDER SURGERY Right    "I BROKE MY SHOUDLER  . THIGH SURGERY     "TO REMOVE A TUMOR "    Social History   Socioeconomic History  . Marital status: Married    Spouse name: Orpah Greek  . Number of children: 3  . Years of education: 10  . Highest education level: Bachelor's degree (e.g., BA, AB, BS)  Occupational History  . Occupation: Retired    Comment: marketing  Tobacco Use  . Smoking status: Never Smoker  . Smokeless tobacco: Never Used  Vaping Use  . Vaping Use: Never used  Substance and Sexual  Activity  . Alcohol use: Not Currently    Alcohol/week: 0.0 standard drinks  . Drug use: No  . Sexual activity: Yes    Partners: Male    Birth control/protection: Post-menopausal  Other Topics Concern  . Not on file  Social History Narrative   Lives at home with husband.       Right handed   Social Determinants of Health   Financial Resource Strain:   . Difficulty of Paying Living Expenses: Not on file  Food Insecurity:   . Worried About Charity fundraiser in the Last Year: Not on file  . Ran Out of Food in the Last Year: Not on file  Transportation Needs:    . Lack of Transportation (Medical): Not on file  . Lack of Transportation (Non-Medical): Not on file  Physical Activity:   . Days of Exercise per Week: Not on file  . Minutes of Exercise per Session: Not on file  Stress:   . Feeling of Stress : Not on file  Social Connections:   . Frequency of Communication with Friends and Family: Not on file  . Frequency of Social Gatherings with Friends and Family: Not on file  . Attends Religious Services: Not on file  . Active Member of Clubs or Organizations: Not on file  . Attends Archivist Meetings: Not on file  . Marital Status: Not on file  Intimate Partner Violence:   . Fear of Current or Ex-Partner: Not on file  . Emotionally Abused: Not on file  . Physically Abused: Not on file  . Sexually Abused: Not on file   Family History  Problem Relation Age of Onset  . Diabetes Mother   . Heart disease Mother   . Heart disease Brother   . Heart disease Sister        CABG  . Diabetes Sister   . Alzheimer's disease Father   . Alcohol abuse Sister   . Mental illness Brother   . Diabetes Brother       VITAL SIGNS BP 93/63   Pulse 84   Temp (!) 96.9 F (36.1 C)   Resp 20   Ht 5\' 2"  (1.575 m)   Wt 195 lb 9.6 oz (88.7 kg)   BMI 35.78 kg/m   Facility-Administered Encounter Medications as of 05/02/2020  Medication  . bupivacaine-epinephrine (MARCAINE W/ EPI) 0.5% -1:200000 injection   Outpatient Encounter Medications as of 05/02/2020  Medication Sig  . acetaminophen (TYLENOL) 325 MG tablet Take 2 tablets (650 mg total) by mouth every 6 (six) hours as needed for mild pain, fever or headache (or Fever >/= 101).  Marland Kitchen albuterol (PROVENTIL) (2.5 MG/3ML) 0.083% nebulizer solution Take 3 mLs (2.5 mg total) by nebulization every 6 (six) hours as needed for wheezing or shortness of breath.  Marland Kitchen albuterol (VENTOLIN HFA) 108 (90 Base) MCG/ACT inhaler Inhale 2 puffs into the lungs every 6 (six) hours as needed for wheezing or shortness of  breath.   Marland Kitchen atorvastatin (LIPITOR) 40 MG tablet Take 1 tablet (40 mg total) by mouth daily.  . budesonide-formoterol (SYMBICORT) 80-4.5 MCG/ACT inhaler Inhale 2 puffs into the lungs 2 (two) times daily.  . Cholecalciferol (VITAMIN D3) 1000 UNITS CAPS Take 1,000 Units by mouth daily.  Marland Kitchen diltiazem (CARDIZEM CD) 120 MG 24 hr capsule TAKE 1 CAPSULE AT BEDTIME  . ELIQUIS 5 MG TABS tablet TAKE 1 TABLET TWICE A DAY  . escitalopram (LEXAPRO) 10 MG tablet Take 1 tablet (10 mg total) by  mouth daily.  . furosemide (LASIX) 40 MG tablet TAKE 1 TABLET EVERY MORNING  (DOSE INCREASED)  . insulin aspart (NOVOLOG) 100 UNIT/ML injection Inject 10 Units into the skin 3 (three) times daily before meals. Special Instructions: hold for cbg <120  . insulin glargine (LANTUS SOLOSTAR) 100 UNIT/ML Solostar Pen Inject 55 Units into the skin 2 (two) times daily.  Marland Kitchen LORazepam (ATIVAN) 1 MG tablet Take 1 mg by mouth in the morning. And 2 mg by mouth at bedtime  . metFORMIN (GLUCOPHAGE) 500 MG tablet Take 500 mg by mouth daily.  . metoprolol tartrate (LOPRESSOR) 50 MG tablet Take 1 tablet (50 mg total) by mouth 2 (two) times daily.  . Multiple Vitamin (MULTIVITAMIN WITH MINERALS) TABS tablet Take 1 tablet by mouth daily.  . NON FORMULARY Diet: _____ Regular, ___x___ NAS, ___x____Consistent Carbohydrate, _______NPO _____Other  . nystatin (MYCOSTATIN/NYSTOP) powder Apply topically 3 (three) times daily. x2 weeks (for groin/breast rash)  . oxyCODONE-acetaminophen (PERCOCET/ROXICET) 5-325 MG tablet Take 1 tablet by mouth 2 (two) times daily as needed for severe pain.  . polyethylene glycol (MIRALAX / GLYCOLAX) 17 g packet Take 17 g by mouth daily.  . potassium chloride SA (KLOR-CON) 20 MEQ tablet Take 1 tablet (20 mEq total) by mouth daily.  . pregabalin (LYRICA) 200 MG capsule Take 1 capsule (200 mg total) by mouth 2 (two) times daily.  . rizatriptan (MAXALT) 10 MG tablet Take 10 mg by mouth daily as needed for migraine. Take  one tablet at onset of migraine. Do not take more than 3 tabs max per week.  . topiramate (TOPAMAX) 25 MG tablet Take 3 tablets (75 mg total) by mouth at bedtime.  Marland Kitchen VASCEPA 1 g capsule TAKE 2 CAPSULES TWICE A DAY WITH MEALS     SIGNIFICANT DIAGNOSTIC EXAMS   PREVIOUS   04-23-20: ct of head:  Minimal periventricular small vessel disease. No acute infarct. No mass or hemorrhage. Foci of arterial vascular calcification noted. Mucosal thickening noted in several ethmoid air cells.  04-23-20: mri of lumbar spine:  1. 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 2. Similar multilevel degenerative change without significant central canal stenosis. Similar mild left foraminal stenosis at L4-L5.  04-23-20: mri of brain:  No acute infarction, hemorrhage, hydrocephalus, extra-axial collection or mass lesion. Scattered T2/FLAIR hyperintensities within the white matter, compatible with chronic microvascular ischemic disease and appropriate for patient's age. Mild diffuse cerebral atrophy  04-23-20 mri cervical spine:  No evidence of cervical spine infection. Multilevel spondylosis. Patent spinal canal. Mild left C6-7 neural foraminal narrowing. Central/paracentral protrusions at the C4-5 and C5-6 level abutting the ventral cord. No evidence of myelomalacia.  04-22-20: mri thoracic spine:  No evidence of infection. Mild degenerative changes and chronic superior T11 endplate deformity with minimal height loss. Shallow left T10-11 paracentral protrusion. Patent spinal canal and neural foramen.  NO NEW EXAMS   LABS REVIEWED PREVIOUS    08-29-19: chol 128; ldl 71; trig 249; hdl 42 04-23-20: wbc 12.3; hgb 14.5; hct 41.9 mcv 90.1 plt 218; glucose 612; bun 35; creat 1.09; k+ 4.3; na++ 130; ca 9.4  liver normal albumin 3.9 hgb a1c 10.9 04-24-20: wbc 11.2; hgb 13.5; hct 40.0; mcv 91.1 plt 188; glucose 227; bun 27; creat 0.75; k+ 3.3; na++ 134; ca 8.4  04-30-20: k+ 3.9.  NO NEW LABS.   Review of Systems  Constitutional: Negative for malaise/fatigue.  Respiratory: Negative for cough and shortness of breath.   Cardiovascular: Negative for chest pain, palpitations and leg swelling.  Gastrointestinal: Negative for abdominal pain, constipation and heartburn.  Musculoskeletal: Positive for back pain and myalgias. Negative for joint pain.  Skin: Negative.   Neurological: Negative for dizziness.  Psychiatric/Behavioral: The patient is not nervous/anxious.    Physical Exam Constitutional:      General: She is not in acute distress.    Appearance: She is well-developed. She is obese. She is not diaphoretic.  Neck:     Thyroid: No thyromegaly.  Cardiovascular:     Rate and Rhythm: Normal rate and regular rhythm.     Pulses: Normal pulses.     Heart sounds: Normal heart sounds.  Pulmonary:     Effort: Pulmonary effort is normal. No respiratory distress.     Breath sounds: Normal breath sounds.  Abdominal:     General: Bowel sounds are normal. There is no distension.     Palpations: Abdomen is soft.     Tenderness: There is no abdominal tenderness.  Musculoskeletal:     Cervical back: Neck supple.     Right lower leg: No edema.     Left lower leg: No edema.     Comments:  Is able to move all extremities Mild bilateral lower extremity weakness present     Lymphadenopathy:     Cervical: No cervical adenopathy.  Skin:    General: Skin is warm and dry.  Neurological:     Mental Status: She is alert and oriented to person, place, and time.  Psychiatric:        Mood and Affect: Mood normal.       ASSESSMENT/ PLAN:  TODAY  1. Chronic bilateral low back pain with bilateral sciatica/ambulatory dysfunction back pain and falls/chornic pain syndrome/fibromyalgia: is stable will  continue lyrica 200 mg twice daily has perocet 5/325 mg twice daily as needed through 05-07-20.   2. Bipolar affective disorder current manic moderate: is stable will continue lexaprol 10 mg daily ativan 1 mg in the AM and 2 mg in the PM. abilify was stopped 04-19-20.   3. Migraine with aura without status migrainosus non intractable: is stable will continue topamax 75 mg nightly has prn maxalt 10 mg.   PREVIOUS   4. Hypertension associated with diabetes: is stable 93/63 will continue lopressor 50 mg twice daily cardizem cd 120 mg daily   5. Chronic atrial fibrillation: heart rate is stable will continue cardizem cd 120 mg daily and lopressor 50 mg twice daily for rate control; will continue eliquis 5 mg twice daily   6. Chronic diastolic congestive heart failure: is stable EF 55-60% (01-22-19) will continue lasix 40 mg daily with k+ 20 meq daily is on lopressor 50 mg twice daily   7. Diabetic polyneuropathy associated with type 2 diabetes mellitus: is stable will continue lyrica 200 mg twice daily  8. Hyperlipidemia associated with type 2 diabetes mellitus: is stable LDL 71 trig 249 will continue lipitor 40 mg daily vascepa 2 gm   9. Uncontrolled type 2 diabetes mellitus with peripheral autonomic neuropathy: hgb a1c 10.9. her cbg readings remain elevated. continue metformin xl 500 mg daily for insulin resistance;  lantus  55 units twice daily  novolog  10 units with meals  10. Chronic respiratory with hypoxia/mild intermittent asthma without complication: is stable will continue symbicort 80/4.5 mcg 2 puffs twice daily  has albuterol neb or 1 puff every 6 hours as needed   11. Chronic constipation: is stable will continue miralax daily   MD is aware of resident's narcotic use and is in agreement with current plan of care. We will attempt to wean resident as appropriate.  Ok Edwards NP [facility]  Contact [phone] Monday through Friday 8am- 5pm  After hours call  [phone]

## 2020-05-03 ENCOUNTER — Non-Acute Institutional Stay (SKILLED_NURSING_FACILITY): Payer: Medicare Other | Admitting: Internal Medicine

## 2020-05-03 ENCOUNTER — Other Ambulatory Visit: Payer: Self-pay | Admitting: *Deleted

## 2020-05-03 ENCOUNTER — Encounter: Payer: Self-pay | Admitting: Internal Medicine

## 2020-05-03 DIAGNOSIS — G43109 Migraine with aura, not intractable, without status migrainosus: Secondary | ICD-10-CM

## 2020-05-03 DIAGNOSIS — E1169 Type 2 diabetes mellitus with other specified complication: Secondary | ICD-10-CM

## 2020-05-03 DIAGNOSIS — E1143 Type 2 diabetes mellitus with diabetic autonomic (poly)neuropathy: Secondary | ICD-10-CM

## 2020-05-03 DIAGNOSIS — I152 Hypertension secondary to endocrine disorders: Secondary | ICD-10-CM

## 2020-05-03 DIAGNOSIS — E785 Hyperlipidemia, unspecified: Secondary | ICD-10-CM

## 2020-05-03 DIAGNOSIS — M5442 Lumbago with sciatica, left side: Secondary | ICD-10-CM

## 2020-05-03 DIAGNOSIS — I482 Chronic atrial fibrillation, unspecified: Secondary | ICD-10-CM | POA: Diagnosis not present

## 2020-05-03 DIAGNOSIS — M5441 Lumbago with sciatica, right side: Secondary | ICD-10-CM

## 2020-05-03 DIAGNOSIS — E1165 Type 2 diabetes mellitus with hyperglycemia: Secondary | ICD-10-CM

## 2020-05-03 DIAGNOSIS — G8929 Other chronic pain: Secondary | ICD-10-CM | POA: Diagnosis not present

## 2020-05-03 DIAGNOSIS — E1159 Type 2 diabetes mellitus with other circulatory complications: Secondary | ICD-10-CM | POA: Diagnosis not present

## 2020-05-03 DIAGNOSIS — IMO0002 Reserved for concepts with insufficient information to code with codable children: Secondary | ICD-10-CM

## 2020-05-03 MED ORDER — ATORVASTATIN CALCIUM 40 MG PO TABS: 40.0000 mg | ORAL_TABLET | Freq: Every day | ORAL | 0 refills | Status: DC

## 2020-05-03 NOTE — Assessment & Plan Note (Addendum)
Glucose in ED was 612;A1c 10.9% IV insulin transitioned to bid insulin. Glucose range @ SNF:103-398, the latter an outlier. Majority of glucoses < 300 ( 19 of 24)

## 2020-05-03 NOTE — Assessment & Plan Note (Addendum)
Pressure is actually low today in the context of combined CCB and beta-blocker therapy.  Blood pressure will be monitored; metoprolol will be decreased to 25 mg twice daily.

## 2020-05-03 NOTE — Patient Instructions (Signed)
See assessment and plan under each diagnosis in the problem list and acutely for this visit 

## 2020-05-03 NOTE — Assessment & Plan Note (Signed)
She describes an active migraine today at the time of the visit.  Maxalt will be continued.

## 2020-05-03 NOTE — Assessment & Plan Note (Signed)
PT/OT @ SNF 

## 2020-05-03 NOTE — Progress Notes (Signed)
NURSING HOME LOCATION:  [facility] NUMBER:158/P    CODE STATUS:Full Code    PCP:  [name], MD  This is a comprehensive admission note to [facility] performed on this date less than 30 days from date of admission. Included are preadmission medical/surgical history; reconciled medication list; family history; social history and comprehensive review of systems.  Corrections and additions to the records were documented. Comprehensive physical exam was also performed. Additionally a clinical summary was entered for each active diagnosis pertinent to this admission in the Problem List to enhance continuity of care.  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 She has had LBP for about 12 years; but this has been progressive over the past few years, especially in the past few weeks. Glucose in ED was 612 for which IV insulin was initiated with subsequent transition to SQ insulin.  Past medical and surgical history: includes obesity,OSA,COPD,CAD,chronic pain syndrome,RLS,NASH, hx of bipolar disorder & chronic AF. Surgeries include breast reduction surgery,& shoulder surgeries.  Social history: non drinker; never smoked.  Family history:reviewed   Review of systems: She states that she was on Lantus twice a day at home and only checked her glucoses 3 times a week.  She states that her fasting glucoses had ranged from a low of 100 up to a high of 200. She does describe polydipsia.  She has had urinary incontinence for 2 weeks without other GU symptoms except for inguinal area candidiasis.  She does use a powder for this. She describes anorexia for 3 months. 2 weeks ago she had a course of diarrhea.  She also was  having mid abdominal discomfort at that time but not now. She is having an active migraine; she sees Dr. Nino Glow. She sees  Dr. Dagoberto Ligas for her chronic back pain at a pain clinic.  Constitutional: No fever, significant weight change Eyes: No redness, discharge, pain, vision change ENT/mouth: No nasal congestion, purulent discharge, earache, change in hearing, sore throat  Cardiovascular: No chest pain, palpitations, paroxysmal nocturnal dyspnea, claudication, edema  Respiratory: No cough, sputum production, hemoptysis, DOE, significant snoring, apnea  Gastrointestinal: No heartburn, dysphagia, nausea /vomiting, rectal bleeding, melena Genitourinary: No dysuria, hematuria, pyuria Neurologic: No dizziness, syncope, seizures, numbness, tingling Psychiatric: No significant anxiety, depression, insomnia Endocrine: No change in hair/skin/nails, excessive hunger, excessive urination  Hematologic/lymphatic: No significant bruising, lymphadenopathy, abnormal bleeding Allergy/immunology: No itchy/watery eyes, significant sneezing, urticaria, angioedema  Physical exam:  Pertinent or positive findings: Facies tend to be blank.  Her responses are slow and she has difficulty with word retrieval intermittently.  She is hard of hearing.  She has an intermittent fine tremor of the mandible.  Heart sounds are distant and the rhythm is slightly irregular.  Breath sounds are also decreased.  Central obesity is present & abdomen is markedly protuberant. Posterior tibial pulses are stronger than the dorsalis pedis pulses. She is weak to opposition in all extremities.  She has intermittent tremor of the right hand of a rotational character rather than a typical parkinsonian tremor.  General appearance: Adequately nourished; no acute distress, increased work of breathing is present.   Lymphatic: No lymphadenopathy about the head, neck, axilla. Eyes: No conjunctival inflammation or lid edema is present. There  is no scleral icterus. Ears:  External ear exam  shows no significant lesions or deformities.   Nose:  External nasal examination shows no deformity or inflammation. Nasal mucosa are pink and moist without lesions, exudates Oral exam: Lips and gums are healthy appearing.There is no oropharyngeal erythema or exudate. Neck:  No thyromegaly, masses, tenderness noted.    Heart:  No gallop, murmur, click, rub.  Lungs: without wheezes, rhonchi, rales, rubs. Abdomen: Bowel sounds are normal.  Abdomen is soft and nontender with no organomegaly, hernias, masses. GU: Deferred  Extremities:  No cyanosis, clubbing, edema. Neurologic exam: Balance, Rhomberg, finger to nose testing could not be completed due to clinical state Skin: Warm & dry w/o tenting. No significant visable lesions or rash (GU exam not performed).  See clinical summary under each active problem in the Problem List with associated updated therapeutic plan

## 2020-05-03 NOTE — Assessment & Plan Note (Signed)
Rate controlled Continue CCB & Eliquis

## 2020-05-07 ENCOUNTER — Non-Acute Institutional Stay (SKILLED_NURSING_FACILITY): Payer: Medicare Other | Admitting: Adult Health

## 2020-05-07 ENCOUNTER — Other Ambulatory Visit: Payer: Self-pay | Admitting: Adult Health

## 2020-05-07 ENCOUNTER — Encounter: Payer: Self-pay | Admitting: Adult Health

## 2020-05-07 DIAGNOSIS — G894 Chronic pain syndrome: Secondary | ICD-10-CM

## 2020-05-07 DIAGNOSIS — M5442 Lumbago with sciatica, left side: Secondary | ICD-10-CM

## 2020-05-07 DIAGNOSIS — M797 Fibromyalgia: Secondary | ICD-10-CM

## 2020-05-07 DIAGNOSIS — M5441 Lumbago with sciatica, right side: Secondary | ICD-10-CM

## 2020-05-07 DIAGNOSIS — G8929 Other chronic pain: Secondary | ICD-10-CM

## 2020-05-07 MED ORDER — OXYCODONE-ACETAMINOPHEN 5-325 MG PO TABS: 1.0000 | ORAL_TABLET | Freq: Two times a day (BID) | ORAL | 0 refills | Status: DC | PRN

## 2020-05-07 NOTE — Progress Notes (Signed)
Location:    [place] Room Number: 158/P Place of Service:  SNF (31)   CODE STATUS: Full Code  Allergies  Allergen Reactions  . Ivp Dye [Iodinated Diagnostic Agents] Anaphylaxis and Swelling    Throat closes   . Latex     Latex tape pulls skin with it    Chief Complaint  Patient presents with  . Acute Visit    Pain Management    HPI:  She has long term issues with pain management due to fibromyalgia; back pain and chronic pain syndrome. She has ordered percocet 5/325 mg every 6 hours as needed. She has been using this medication one to two times daily as needed. We have discussed her long term use of narcotics and the adverse effects. She has verbalized understanding and is willing to try lowering her pain medication to twice daily as needed.    Past Medical History:  Diagnosis Date  . Atrial fibrillation with RVR 05/19/2017  . Bipolar I disorder 01/23/2015  . CAD (coronary artery disease)    Nonobstructive; Managed by Dr. Bronson Ing  . Cardiomegaly 01/12/2018  . Chronic anticoagulation 05/30/2015  . Chronic back pain    Lower back  . Chronic diastolic CHF (congestive heart failure) 05/30/2015  . Diabetes mellitus, type 2, without complication   . Diabetic neuropathy 02/06/2016  . Dysrhythmia    A-Fib  . Essential hypertension   . Herpes genitalis in women 07/16/2015  . Hyperlipidemia   . Hypertension   . Hypoxia 02/01/2019  . Insomnia 01/23/2015  . Lipoma 02/08/2015  . Major depressive disorder   . Migraine headache with aura 02/12/2016  . 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[place]) 04/21/2019  . NAFL (nonalcoholic fatty liver) 7/89/3810  . OSA (obstructive sleep apnea) 02/24/2019   10/09/2018 - HST  - AHI 40.6   . Pulmonary hypertension   . Renal insufficiency    Managed by Dr. Wallace Keller  . RLS (restless legs syndrome) 04/27/2015    Past Surgical History:  Procedure Laterality Date  . BREAST REDUCTION SURGERY    . EYE SURGERY Right     cateracts  . HAMMER TOE SURGERY    . LEFT HEART CATH AND CORONARY ANGIOGRAPHY N/A 02/03/2018   Procedure: LEFT HEART CATH AND CORONARY ANGIOGRAPHY;  Surgeon: Martinique, Peter M, MD;  Location: [facility];  Service: Cardiovascular;  Laterality: N/A;  . REVERSE SHOULDER ARTHROPLASTY Right 08/19/2019   Procedure: REVERSE SHOULDER ARTHROPLASTY;  Surgeon: Netta Cedars, MD;  Location: [facility];  Service: Orthopedics;  Laterality: Right;  interscalene block  . SHOULDER SURGERY Right    "I BROKE MY SHOUDLER  . THIGH SURGERY     "TO REMOVE A TUMOR "    Social History   Socioeconomic History  . Marital status: Married    Spouse name: Orpah Greek  . Number of children: 3  . Years of education: 72  . Highest education level: Bachelor's degree (e.g., BA, AB, BS)  Occupational History  . Occupation: Retired    Comment: marketing  Tobacco Use  . Smoking status: Never Smoker  . Smokeless tobacco: Never Used  Vaping Use  . Vaping Use: Never used  Substance and Sexual Activity  . Alcohol use: Not Currently    Alcohol/week: 0.0 standard drinks  . Drug use: No  . Sexual activity: Yes    Partners: Male    Birth control/protection: Post-menopausal  Other Topics Concern  . Not on file  Social History Narrative   Lives at  home with husband.       Right handed   Social Determinants of Health   Financial Resource Strain:   . Difficulty of Paying Living Expenses: Not on file  Food Insecurity:   . Worried About Charity fundraiser in the Last Year: Not on file  . Ran Out of Food in the Last Year: Not on file  Transportation Needs:   . Lack of Transportation (Medical): Not on file  . Lack of Transportation (Non-Medical): Not on file  Physical Activity:   . Days of Exercise per Week: Not on file  . Minutes of Exercise per Session: Not on file  Stress:   . Feeling of Stress : Not on file  Social Connections:   . Frequency of Communication with Friends and Family: Not on file  . Frequency of  Social Gatherings with Friends and Family: Not on file  . Attends Religious Services: Not on file  . Active Member of Clubs or Organizations: Not on file  . Attends Archivist Meetings: Not on file  . Marital Status: Not on file  Intimate Partner Violence:   . Fear of Current or Ex-Partner: Not on file  . Emotionally Abused: Not on file  . Physically Abused: Not on file  . Sexually Abused: Not on file   Family History  Problem Relation Age of Onset  . Diabetes Mother   . Heart disease Mother   . Heart disease Brother   . Heart disease Sister        CABG  . Diabetes Sister   . Alzheimer's disease Father   . Alcohol abuse Sister   . Mental illness Brother   . Diabetes Brother       VITAL SIGNS BP 105/70   Pulse 71   Temp (!) 97.3 F (36.3 C)   Resp 16   Ht 5\' 2"  (1.575 m)   Wt 194 lb 9.6 oz (88.3 kg)   SpO2 96%   BMI 35.59 kg/m   Facility-Administered Encounter Medications as of 05/07/2020  Medication  . bupivacaine-epinephrine (MARCAINE W/ EPI) 0.5% -1:200000 injection   Outpatient Encounter Medications as of 05/07/2020  Medication Sig  . acetaminophen (TYLENOL) 325 MG tablet Take 2 tablets (650 mg total) by mouth every 6 (six) hours as needed for mild pain, fever or headache (or Fever >/= 101).  Marland Kitchen albuterol (PROVENTIL) (2.5 MG/3ML) 0.083% nebulizer solution Take 3 mLs (2.5 mg total) by nebulization every 6 (six) hours as needed for wheezing or shortness of breath.  Marland Kitchen albuterol (VENTOLIN HFA) 108 (90 Base) MCG/ACT inhaler Inhale 2 puffs into the lungs every 6 (six) hours as needed for wheezing or shortness of breath.   Marland Kitchen atorvastatin (LIPITOR) 40 MG tablet Take 1 tablet (40 mg total) by mouth daily.  . budesonide-formoterol (SYMBICORT) 80-4.5 MCG/ACT inhaler Inhale 2 puffs into the lungs 2 (two) times daily.  . Cholecalciferol (VITAMIN D3) 1000 UNITS CAPS Take 1,000 Units by mouth daily.  Marland Kitchen diltiazem (CARDIZEM CD) 120 MG 24 hr capsule TAKE 1 CAPSULE AT  BEDTIME  . ELIQUIS 5 MG TABS tablet TAKE 1 TABLET TWICE A DAY  . escitalopram (LEXAPRO) 10 MG tablet Take 1 tablet (10 mg total) by mouth daily.  . furosemide (LASIX) 40 MG tablet TAKE 1 TABLET EVERY MORNING  (DOSE INCREASED)  . insulin aspart (NOVOLOG) 100 UNIT/ML injection Inject 10 Units into the skin 3 (three) times daily before meals. Special Instructions: hold for cbg <120  . insulin glargine (LANTUS  SOLOSTAR) 100 UNIT/ML Solostar Pen Inject 55 Units into the skin 2 (two) times daily.  Marland Kitchen LORazepam (ATIVAN) 1 MG tablet Take 1 mg by mouth in the morning. And 2 mg by mouth at bedtime  . metFORMIN (GLUCOPHAGE) 500 MG tablet Take 500 mg by mouth daily.  . metoprolol tartrate (LOPRESSOR) 25 MG tablet Take 25 mg by mouth 2 (two) times daily.  . Multiple Vitamin (MULTIVITAMIN WITH MINERALS) TABS tablet Take 1 tablet by mouth daily.  . NON FORMULARY Diet: _____ Regular, ___x___ NAS, ___x____Consistent Carbohydrate, _______NPO _____Other  . nystatin (MYCOSTATIN/NYSTOP) powder Apply topically 3 (three) times daily. x2 weeks (for groin/breast rash)  . oxyCODONE-acetaminophen (PERCOCET/ROXICET) 5-325 MG tablet Take 1 tablet by mouth 2 (two) times daily as needed for severe pain.  . polyethylene glycol (MIRALAX / GLYCOLAX) 17 g packet Take 17 g by mouth daily.  . potassium chloride SA (KLOR-CON) 20 MEQ tablet Take 1 tablet (20 mEq total) by mouth daily.  . pregabalin (LYRICA) 200 MG capsule Take 1 capsule (200 mg total) by mouth 2 (two) times daily.  . rizatriptan (MAXALT) 10 MG tablet Take 10 mg by mouth daily as needed for migraine. Take one tablet at onset of migraine. Do not take more than 3 tabs max per week.  . topiramate (TOPAMAX) 25 MG tablet Take 3 tablets (75 mg total) by mouth at bedtime.  Marland Kitchen VASCEPA 1 g capsule TAKE 2 CAPSULES TWICE A DAY WITH MEALS  . [DISCONTINUED] metoprolol tartrate (LOPRESSOR) 50 MG tablet Take 1 tablet (50 mg total) by mouth 2 (two) times daily.     SIGNIFICANT  DIAGNOSTIC EXAMS   PREVIOUS   04-23-20: ct of head:  Minimal periventricular small vessel disease. No acute infarct. No mass or hemorrhage. Foci of arterial vascular calcification noted. Mucosal thickening noted in several ethmoid air cells.  04-23-20: mri of lumbar spine:  1. 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 2. Similar multilevel degenerative change without significant central canal stenosis. Similar mild left foraminal stenosis at L4-L5.  04-23-20: mri of brain:  No acute infarction, hemorrhage, hydrocephalus, extra-axial collection or mass lesion. Scattered T2/FLAIR hyperintensities within the white matter, compatible with chronic microvascular ischemic disease and appropriate for patient's age. Mild diffuse cerebral atrophy  04-23-20 mri cervical spine:  No evidence of cervical spine infection. Multilevel spondylosis. Patent spinal canal. Mild left C6-7 neural foraminal narrowing. Central/paracentral protrusions at the C4-5 and C5-6 level abutting the ventral cord. No evidence of myelomalacia.  04-22-20: mri thoracic spine:  No evidence of infection. Mild degenerative changes and chronic superior T11 endplate deformity with minimal height loss. Shallow left T10-11 paracentral protrusion. Patent spinal canal and neural foramen.  NO NEW EXAMS   LABS REVIEWED PREVIOUS    08-29-19: chol 128; ldl 71; trig 249; hdl 42 04-23-20: wbc 12.3; hgb 14.5; hct 41.9 mcv 90.1 plt 218; glucose 612; bun 35; creat 1.09; k+ 4.3; na++ 130; ca 9.4 liver normal albumin 3.9 hgb a1c 10.9 04-24-20: wbc 11.2; hgb 13.5; hct 40.0; mcv 91.1 plt 188; glucose 227; bun 27; creat 0.75; k+ 3.3; na++ 134; ca 8.4  04-30-20: k+ 3.9.   NO NEW LABS.   Review of Systems  Constitutional: Negative for  malaise/fatigue.  Respiratory: Negative for cough and shortness of breath.   Cardiovascular: Negative for chest pain, palpitations  and leg swelling.  Gastrointestinal: Negative for abdominal pain, constipation and heartburn.  Musculoskeletal: Negative for back pain, joint pain and myalgias.  Skin: Negative.   Neurological: Negative for dizziness.  Psychiatric/Behavioral: The patient is not nervous/anxious.     Physical Exam Constitutional:      General: She is not in acute distress.    Appearance: She is well-developed. She is obese. She is not diaphoretic.  Neck:     Thyroid: No thyromegaly.  Cardiovascular:     Rate and Rhythm: Normal rate and regular rhythm.     Pulses: Normal pulses.     Heart sounds: Normal heart sounds.  Pulmonary:     Effort: Pulmonary effort is normal. No respiratory distress.     Breath sounds: Normal breath sounds.  Abdominal:     General: Bowel sounds are normal. There is no distension.     Palpations: Abdomen is soft.     Tenderness: There is no abdominal tenderness.  Musculoskeletal:     Cervical back: Neck supple.     Right lower leg: No edema.     Left lower leg: No edema.     Comments: Is able to move all extremities      Lymphadenopathy:     Cervical: No cervical adenopathy.  Skin:    General: Skin is warm and dry.  Neurological:     Mental Status: She is alert and oriented to person, place, and time.  Psychiatric:        Mood and Affect: Mood normal.       ASSESSMENT/ PLAN:  TODAY  1. Fibromyalgia 2. Chronic pain syndrome 3. 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  Will monitor her response.     MD is aware of resident's narcotic use and is in agreement with current plan of care. We will attempt to wean resident as appropriate.  Ok Edwards NP [facility]  Contact [phone] Monday through Friday 8am- 5pm  After hours call  [phone]

## 2020-05-09 ENCOUNTER — Non-Acute Institutional Stay (SKILLED_NURSING_FACILITY): Payer: Medicare Other | Admitting: Adult Health

## 2020-05-09 ENCOUNTER — Encounter: Payer: Self-pay | Admitting: Adult Health

## 2020-05-09 DIAGNOSIS — I482 Chronic atrial fibrillation, unspecified: Secondary | ICD-10-CM | POA: Diagnosis not present

## 2020-05-09 DIAGNOSIS — E1159 Type 2 diabetes mellitus with other circulatory complications: Secondary | ICD-10-CM

## 2020-05-09 DIAGNOSIS — I152 Hypertension secondary to endocrine disorders: Secondary | ICD-10-CM | POA: Diagnosis not present

## 2020-05-09 DIAGNOSIS — I5032 Chronic diastolic (congestive) heart failure: Secondary | ICD-10-CM | POA: Diagnosis not present

## 2020-05-09 NOTE — Progress Notes (Signed)
Location:    [facility] Room Number: 158/P Place of Service:  SNF (31)   CODE STATUS: Full Code  Allergies  Allergen Reactions  . Ivp Dye [Iodinated Diagnostic Agents] Anaphylaxis and Swelling    Throat closes   . Latex     Latex tape pulls skin with it    Chief Complaint  Patient presents with  . Short Term Rehab (STR)         Hypertension associated with diabetes:     Chronic atrial fibrillation:   Chronic diastolic congestive heart failure   Weekly follow up for the first 30 days post hospitalization.     HPI:  She is a 74 year old short term rehab patient being seen for the management of her chronic illnesses: Hypertension associated with diabetes:     Chronic atrial fibrillation:   Chronic diastolic congestive heart failure. There are no reports of uncontrolled pain; no reports of cough or shortness of breath. She continues to participate in therapy.    Past Medical History:  Diagnosis Date  . Atrial fibrillation with RVR 05/19/2017  . Bipolar I disorder 01/23/2015  . CAD (coronary artery disease)    Nonobstructive; Managed by Dr. Bronson Ing  . Cardiomegaly 01/12/2018  . Chronic anticoagulation 05/30/2015  . Chronic back pain    Lower back  . Chronic diastolic CHF (congestive heart failure) 05/30/2015  . Diabetes mellitus, type 2, without complication   . Diabetic neuropathy 02/06/2016  . Dysrhythmia    A-Fib  . Essential hypertension   . Herpes genitalis in women 07/16/2015  . Hyperlipidemia   . Hypertension   . Hypoxia 02/01/2019  . Insomnia 01/23/2015  . Lipoma 02/08/2015  . Major depressive disorder   . Migraine headache with aura 02/12/2016  . 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[place]) 04/21/2019  . NAFL (nonalcoholic fatty liver) 07/24/9796  . OSA (obstructive sleep apnea) 02/24/2019   10/09/2018 - HST  - AHI 40.6   . Pulmonary hypertension   . Renal insufficiency    Managed by Dr. Wallace Keller  . RLS (restless legs syndrome) 04/27/2015     Past Surgical History:  Procedure Laterality Date  . BREAST REDUCTION SURGERY    . EYE SURGERY Right    cateracts  . HAMMER TOE SURGERY    . LEFT HEART CATH AND CORONARY ANGIOGRAPHY N/A 02/03/2018   Procedure: LEFT HEART CATH AND CORONARY ANGIOGRAPHY;  Surgeon: Martinique, Peter M, MD;  Location: [facility];  Service: Cardiovascular;  Laterality: N/A;  . REVERSE SHOULDER ARTHROPLASTY Right 08/19/2019   Procedure: REVERSE SHOULDER ARTHROPLASTY;  Surgeon: Netta Cedars, MD;  Location: [facility];  Service: Orthopedics;  Laterality: Right;  interscalene block  . SHOULDER SURGERY Right    "I BROKE MY SHOUDLER  . THIGH SURGERY     "TO REMOVE A TUMOR "    Social History   Socioeconomic History  . Marital status: Married    Spouse name: Orpah Greek  . Number of children: 3  . Years of education: 30  . Highest education level: Bachelor's degree (e.g., BA, AB, BS)  Occupational History  . Occupation: Retired    Comment: marketing  Tobacco Use  . Smoking status: Never Smoker  . Smokeless tobacco: Never Used  Vaping Use  . Vaping Use: Never used  Substance and Sexual Activity  . Alcohol use: Not Currently    Alcohol/week: 0.0 standard drinks  . Drug use: No  . Sexual activity: Yes    Partners: Male  Birth control/protection: Post-menopausal  Other Topics Concern  . Not on file  Social History Narrative   Lives at home with husband.       Right handed   Social Determinants of Health   Financial Resource Strain:   . Difficulty of Paying Living Expenses: Not on file  Food Insecurity:   . Worried About Charity fundraiser in the Last Year: Not on file  . Ran Out of Food in the Last Year: Not on file  Transportation Needs:   . Lack of Transportation (Medical): Not on file  . Lack of Transportation (Non-Medical): Not on file  Physical Activity:   . Days of Exercise per Week: Not on file  . Minutes of Exercise per Session: Not on file  Stress:   . Feeling of Stress : Not on  file  Social Connections:   . Frequency of Communication with Friends and Family: Not on file  . Frequency of Social Gatherings with Friends and Family: Not on file  . Attends Religious Services: Not on file  . Active Member of Clubs or Organizations: Not on file  . Attends Archivist Meetings: Not on file  . Marital Status: Not on file  Intimate Partner Violence:   . Fear of Current or Ex-Partner: Not on file  . Emotionally Abused: Not on file  . Physically Abused: Not on file  . Sexually Abused: Not on file   Family History  Problem Relation Age of Onset  . Diabetes Mother   . Heart disease Mother   . Heart disease Brother   . Heart disease Sister        CABG  . Diabetes Sister   . Alzheimer's disease Father   . Alcohol abuse Sister   . Mental illness Brother   . Diabetes Brother       VITAL SIGNS BP 105/70   Pulse 71   Temp (!) 97.3 F (36.3 C)   Resp 16   Ht 5\' 2"  (1.575 m)   Wt 196 lb 3.2 oz (89 kg)   SpO2 96%   BMI 35.89 kg/m   Facility-Administered Encounter Medications as of 05/09/2020  Medication  . bupivacaine-epinephrine (MARCAINE W/ EPI) 0.5% -1:200000 injection   Outpatient Encounter Medications as of 05/09/2020  Medication Sig  . acetaminophen (TYLENOL) 325 MG tablet Take 2 tablets (650 mg total) by mouth every 6 (six) hours as needed for mild pain, fever or headache (or Fever >/= 101).  Marland Kitchen albuterol (PROVENTIL) (2.5 MG/3ML) 0.083% nebulizer solution Take 3 mLs (2.5 mg total) by nebulization every 6 (six) hours as needed for wheezing or shortness of breath.  Marland Kitchen albuterol (VENTOLIN HFA) 108 (90 Base) MCG/ACT inhaler Inhale 2 puffs into the lungs every 6 (six) hours as needed for wheezing or shortness of breath.   Marland Kitchen atorvastatin (LIPITOR) 40 MG tablet Take 1 tablet (40 mg total) by mouth daily.  . budesonide-formoterol (SYMBICORT) 80-4.5 MCG/ACT inhaler Inhale 2 puffs into the lungs 2 (two) times daily.  . Cholecalciferol (VITAMIN D3) 1000 UNITS  CAPS Take 1,000 Units by mouth daily.  Marland Kitchen diltiazem (CARDIZEM CD) 120 MG 24 hr capsule TAKE 1 CAPSULE AT BEDTIME  . ELIQUIS 5 MG TABS tablet TAKE 1 TABLET TWICE A DAY  . escitalopram (LEXAPRO) 10 MG tablet Take 1 tablet (10 mg total) by mouth daily.  . furosemide (LASIX) 40 MG tablet TAKE 1 TABLET EVERY MORNING  (DOSE INCREASED)  . insulin aspart (NOVOLOG) 100 UNIT/ML injection Inject 10 Units  into the skin 3 (three) times daily before meals. Special Instructions: hold for cbg <120  . insulin glargine (LANTUS SOLOSTAR) 100 UNIT/ML Solostar Pen Inject 55 Units into the skin 2 (two) times daily.  Marland Kitchen LORazepam (ATIVAN) 1 MG tablet Take 1 mg by mouth in the morning. And 2 mg by mouth at bedtime  . metFORMIN (GLUCOPHAGE) 500 MG tablet Take 500 mg by mouth daily.  . metoprolol tartrate (LOPRESSOR) 25 MG tablet Take 25 mg by mouth 2 (two) times daily.  . Multiple Vitamin (MULTIVITAMIN WITH MINERALS) TABS tablet Take 1 tablet by mouth daily.  . NON FORMULARY Diet: _____ Regular, ___x___ NAS, ___x____Consistent Carbohydrate, _______NPO _____Other  . oxyCODONE-acetaminophen (PERCOCET/ROXICET) 5-325 MG tablet Take 1 tablet by mouth 2 (two) times daily as needed for severe pain.  . polyethylene glycol (MIRALAX / GLYCOLAX) 17 g packet Take 17 g by mouth daily.  . potassium chloride SA (KLOR-CON) 20 MEQ tablet Take 1 tablet (20 mEq total) by mouth daily.  . pregabalin (LYRICA) 200 MG capsule Take 1 capsule (200 mg total) by mouth 2 (two) times daily.  . rizatriptan (MAXALT) 10 MG tablet Take 10 mg by mouth daily as needed for migraine. Take one tablet at onset of migraine. Do not take more than 3 tabs max per week.  . topiramate (TOPAMAX) 25 MG tablet Take 3 tablets (75 mg total) by mouth at bedtime.  Marland Kitchen VASCEPA 1 g capsule TAKE 2 CAPSULES TWICE A DAY WITH MEALS  . [DISCONTINUED] nystatin (MYCOSTATIN/NYSTOP) powder Apply topically 3 (three) times daily. x2 weeks (for groin/breast rash)     SIGNIFICANT  DIAGNOSTIC EXAMS  PREVIOUS   04-23-20: ct of head:  Minimal periventricular small vessel disease. No acute infarct. No mass or hemorrhage. Foci of arterial vascular calcification noted. Mucosal thickening noted in several ethmoid air cells.  04-23-20: mri of lumbar spine:  1. 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 2. Similar multilevel degenerative change without significant central canal stenosis. Similar mild left foraminal stenosis at L4-L5.  04-23-20: mri of brain:  No acute infarction, hemorrhage, hydrocephalus, extra-axial collection or mass lesion. Scattered T2/FLAIR hyperintensities within the white matter, compatible with chronic microvascular ischemic disease and appropriate for patient's age. Mild diffuse cerebral atrophy  04-23-20 mri cervical spine:  No evidence of cervical spine infection. Multilevel spondylosis. Patent spinal canal. Mild left C6-7 neural foraminal narrowing. Central/paracentral protrusions at the C4-5 and C5-6 level abutting the ventral cord. No evidence of myelomalacia.  04-22-20: mri thoracic spine:  No evidence of infection. Mild degenerative changes and chronic superior T11 endplate deformity with minimal height loss. Shallow left T10-11 paracentral protrusion. Patent spinal canal and neural foramen.  NO NEW EXAMS   LABS REVIEWED PREVIOUS    08-29-19: chol 128; ldl 71; trig 249; hdl 42 04-23-20: wbc 12.3; hgb 14.5; hct 41.9 mcv 90.1 plt 218; glucose 612; bun 35; creat 1.09; k+ 4.3; na++ 130; ca 9.4 liver normal albumin 3.9 hgb a1c 10.9 04-24-20: wbc 11.2; hgb 13.5; hct 40.0; mcv 91.1 plt 188; glucose 227; bun 27; creat 0.75; k+ 3.3; na++ 134; ca 8.4  04-30-20: k+ 3.9.   NO NEW LABS.   Review of Systems  Constitutional: Negative for  malaise/fatigue.  Respiratory: Negative for cough and shortness of breath.   Cardiovascular: Negative for chest pain, palpitations and  leg swelling.  Gastrointestinal: Negative for abdominal pain, constipation and heartburn.  Musculoskeletal: Negative for back pain, joint pain and myalgias.  Skin: Negative.   Neurological: Negative for dizziness.  Psychiatric/Behavioral: The patient is not nervous/anxious.     Physical Exam Constitutional:      General: She is not in acute distress.    Appearance: She is well-developed and well-nourished. She is obese. She is not diaphoretic.  Neck:     Thyroid: No thyromegaly.  Cardiovascular:     Rate and Rhythm: Normal rate and regular rhythm.     Pulses: Normal pulses and intact distal pulses.     Heart sounds: Normal heart sounds.  Pulmonary:     Effort: Pulmonary effort is normal. No respiratory distress.     Breath sounds: Normal breath sounds.  Abdominal:     General: Bowel sounds are normal. There is no distension.     Palpations: Abdomen is soft.     Tenderness: There is no abdominal tenderness.  Musculoskeletal:        General: No edema. Normal range of motion.     Cervical back: Neck supple.     Right lower leg: No edema.     Left lower leg: No edema.  Lymphadenopathy:     Cervical: No cervical adenopathy.  Skin:    General: Skin is warm and dry.  Neurological:     Mental Status: She is alert and oriented to person, place, and time.  Psychiatric:        Mood and Affect: Mood and affect and mood normal.      ASSESSMENT/ PLAN:  TODAY  1. Hypertension associated with diabetes: is stable b/p 105/70 will continue lopressor 25 mg twice daily cardizem cd 120 mg daily   2. Chronic atrial fibrillation: heart rate is stable will continue cardizem cd 120 mg daily lopressor 25 mg twice daily for rate control will continue eliquis 5 mg twice daily   3. Chronic diastolic congestive heart failure: is stable EF 55-60% (01-22-19) will  continue lasix 40 mg daily with k+ 20 mda daily lopressor 25 mg twice daily   PREVIOUS   4. Diabetic polyneuropathy associated with type 2 diabetes mellitus: is stable will continue lyrica 200 mg twice daily  5. Hyperlipidemia associated with type 2 diabetes mellitus: is stable LDL 71 trig 249 will continue lipitor 40 mg daily vascepa 2 gm   6. Uncontrolled type 2 diabetes mellitus with peripheral autonomic neuropathy: hgb a1c 10.9. her cbg readings remain elevated. continue metformin xl 500 mg daily for insulin resistance;  lantus  55 units twice daily  novolog  10 units with meals  7. Chronic respiratory with hypoxia/mild intermittent asthma without complication: is stable will continue symbicort 80/4.5 mcg 2 puffs twice daily has albuterol neb or 1 puff every 6 hours as needed   8. Chronic constipation: is stable will continue miralax daily   9. Chronic bilateral low back pain with bilateral sciatica/ambulatory dysfunction back pain and falls/chornic pain syndrome/fibromyalgia: is stable will continue lyrica 200 mg twice daily has perocet 5/325 mg twice daily as needed long term .   10. Bipolar affective disorder current manic moderate: is stable will continue lexaprol 10 mg daily ativan 1 mg in the AM and 2 mg in the PM. abilify was stopped 04-19-20.   11. Migraine with aura without status migrainosus non intractable: is stable will continue topamax 75 mg nightly has prn maxalt 10 mg.       MD is aware  of resident's narcotic use and is in agreement with current plan of care. We will attempt to wean resident as appropriate.  Ok Edwards NP [facility]  Contact [phone] Monday through Friday 8am- 5pm  After hours call [phone]

## 2020-05-10 ENCOUNTER — Encounter: Payer: Self-pay | Admitting: Adult Health

## 2020-05-10 ENCOUNTER — Non-Acute Institutional Stay (SKILLED_NURSING_FACILITY): Payer: Medicare Other | Admitting: Adult Health

## 2020-05-10 DIAGNOSIS — M5442 Lumbago with sciatica, left side: Secondary | ICD-10-CM | POA: Diagnosis not present

## 2020-05-10 DIAGNOSIS — J9611 Chronic respiratory failure with hypoxia: Secondary | ICD-10-CM | POA: Diagnosis not present

## 2020-05-10 DIAGNOSIS — G8929 Other chronic pain: Secondary | ICD-10-CM | POA: Diagnosis not present

## 2020-05-10 DIAGNOSIS — M5441 Lumbago with sciatica, right side: Secondary | ICD-10-CM | POA: Diagnosis not present

## 2020-05-10 DIAGNOSIS — I5032 Chronic diastolic (congestive) heart failure: Secondary | ICD-10-CM

## 2020-05-10 DIAGNOSIS — F3112 Bipolar disorder, current episode manic without psychotic features, moderate: Secondary | ICD-10-CM

## 2020-05-10 NOTE — Progress Notes (Signed)
Location:    [facility] Room Number: 158/P Place of Service:  SNF (31)   CODE STATUS: Full Code  Allergies  Allergen Reactions  . Ivp Dye [Iodinated Diagnostic Agents] Anaphylaxis and Swelling    Throat closes   . Latex     Latex tape pulls skin with it    Chief Complaint  Patient presents with  . Acute Visit    Care Plan Meeting     HPI:  We have come together for her care plan meeting. Family present. BIMS 14/15 mood 9/30. Her goal remains to return back home. She is independent to supervision with her adls; is continent of bladder and bowel . There are have no falls. She continues to participate in therapy is ambulating about 200 feet. Her weight is stable there are no reports of uncontrolled pain. She continues to be followed for her chronic illnesses including: Chronic diastolic heart failure  Chronic respiratory failure with hypoxia  Bipolar affective disorder currently manic moderate  Chronic bilateral low back pain with bilateral sciatica   Past Medical History:  Diagnosis Date  . Atrial fibrillation with RVR 05/19/2017  . Bipolar I disorder 01/23/2015  . CAD (coronary artery disease)    Nonobstructive; Managed by Dr. Bronson Ing  . Cardiomegaly 01/12/2018  . Chronic anticoagulation 05/30/2015  . Chronic back pain    Lower back  . Chronic diastolic CHF (congestive heart failure) 05/30/2015  . Diabetes mellitus, type 2, without complication   . Diabetic neuropathy 02/06/2016  . Dysrhythmia    A-Fib  . Essential hypertension   . Herpes genitalis in women 07/16/2015  . Hyperlipidemia   . Hypertension   . Hypoxia 02/01/2019  . Insomnia 01/23/2015  . Lipoma 02/08/2015  . Major depressive disorder   . Migraine headache with aura 02/12/2016  . 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[place]) 04/21/2019  . NAFL (nonalcoholic fatty liver) 6/64/4034  . OSA (obstructive sleep apnea) 02/24/2019   10/09/2018 - HST  - AHI 40.6   . Pulmonary hypertension   .  Renal insufficiency    Managed by Dr. Wallace Keller  . RLS (restless legs syndrome) 04/27/2015    Past Surgical History:  Procedure Laterality Date  . BREAST REDUCTION SURGERY    . EYE SURGERY Right    cateracts  . HAMMER TOE SURGERY    . LEFT HEART CATH AND CORONARY ANGIOGRAPHY N/A 02/03/2018   Procedure: LEFT HEART CATH AND CORONARY ANGIOGRAPHY;  Surgeon: Martinique, Peter M, MD;  Location: [facility];  Service: Cardiovascular;  Laterality: N/A;  . REVERSE SHOULDER ARTHROPLASTY Right 08/19/2019   Procedure: REVERSE SHOULDER ARTHROPLASTY;  Surgeon: Netta Cedars, MD;  Location: [facility];  Service: Orthopedics;  Laterality: Right;  interscalene block  . SHOULDER SURGERY Right    "I BROKE MY SHOUDLER  . THIGH SURGERY     "TO REMOVE A TUMOR "    Social History   Socioeconomic History  . Marital status: Married    Spouse name: Orpah Greek  . Number of children: 3  . Years of education: 12  . Highest education level: Bachelor's degree (e.g., BA, AB, BS)  Occupational History  . Occupation: Retired    Comment: marketing  Tobacco Use  . Smoking status: Never Smoker  . Smokeless tobacco: Never Used  Vaping Use  . Vaping Use: Never used  Substance and Sexual Activity  . Alcohol use: Not Currently    Alcohol/week: 0.0 standard drinks  . Drug use: No  . Sexual activity:  Yes    Partners: Male    Birth control/protection: Post-menopausal  Other Topics Concern  . Not on file  Social History Narrative   Lives at home with husband.       Right handed   Social Determinants of Health   Financial Resource Strain: Not on file  Food Insecurity: Not on file  Transportation Needs: Not on file  Physical Activity: Not on file  Stress: Not on file  Social Connections: Not on file  Intimate Partner Violence: Not on file   Family History  Problem Relation Age of Onset  . Diabetes Mother   . Heart disease Mother   . Heart disease Brother   . Heart disease Sister        CABG  . Diabetes  Sister   . Alzheimer's disease Father   . Alcohol abuse Sister   . Mental illness Brother   . Diabetes Brother       VITAL SIGNS BP 105/70   Pulse 71   Temp (!) 97.3 F (36.3 C)   Resp 16   Ht 5\' 2"  (1.575 m)   Wt 196 lb 3.2 oz (89 kg)   SpO2 96%   BMI 35.89 kg/m   Facility-Administered Encounter Medications as of 05/10/2020  Medication  . bupivacaine-epinephrine (MARCAINE W/ EPI) 0.5% -1:200000 injection   Outpatient Encounter Medications as of 05/10/2020  Medication Sig  . acetaminophen (TYLENOL) 325 MG tablet Take 2 tablets (650 mg total) by mouth every 6 (six) hours as needed for mild pain, fever or headache (or Fever >/= 101).  Marland Kitchen albuterol (PROVENTIL) (2.5 MG/3ML) 0.083% nebulizer solution Take 3 mLs (2.5 mg total) by nebulization every 6 (six) hours as needed for wheezing or shortness of breath.  Marland Kitchen albuterol (VENTOLIN HFA) 108 (90 Base) MCG/ACT inhaler Inhale 2 puffs into the lungs every 6 (six) hours as needed for wheezing or shortness of breath.   Marland Kitchen atorvastatin (LIPITOR) 40 MG tablet Take 1 tablet (40 mg total) by mouth daily.  . budesonide-formoterol (SYMBICORT) 80-4.5 MCG/ACT inhaler Inhale 2 puffs into the lungs 2 (two) times daily.  . Cholecalciferol (VITAMIN D3) 1000 UNITS CAPS Take 1,000 Units by mouth daily.  Marland Kitchen diltiazem (CARDIZEM CD) 120 MG 24 hr capsule TAKE 1 CAPSULE AT BEDTIME  . ELIQUIS 5 MG TABS tablet TAKE 1 TABLET TWICE A DAY  . escitalopram (LEXAPRO) 10 MG tablet Take 1 tablet (10 mg total) by mouth daily.  . furosemide (LASIX) 40 MG tablet TAKE 1 TABLET EVERY MORNING  (DOSE INCREASED)  . insulin aspart (NOVOLOG) 100 UNIT/ML injection Inject 10 Units into the skin 3 (three) times daily before meals. Special Instructions: hold for cbg <120  . insulin glargine (LANTUS SOLOSTAR) 100 UNIT/ML Solostar Pen Inject 55 Units into the skin 2 (two) times daily.  Marland Kitchen LORazepam (ATIVAN) 1 MG tablet Take 1 mg by mouth in the morning. And 2 mg by mouth at bedtime  .  metFORMIN (GLUCOPHAGE) 500 MG tablet Take 500 mg by mouth daily.  . metoprolol tartrate (LOPRESSOR) 25 MG tablet Take 25 mg by mouth 2 (two) times daily.  . Multiple Vitamin (MULTIVITAMIN WITH MINERALS) TABS tablet Take 1 tablet by mouth daily.  . NON FORMULARY Diet: _____ Regular, ___x___ NAS, ___x____Consistent Carbohydrate, _______NPO _____Other  . oxyCODONE-acetaminophen (PERCOCET/ROXICET) 5-325 MG tablet Take 1 tablet by mouth 2 (two) times daily as needed for severe pain.  . polyethylene glycol (MIRALAX / GLYCOLAX) 17 g packet Take 17 g by mouth daily.  Marland Kitchen  potassium chloride SA (KLOR-CON) 20 MEQ tablet Take 1 tablet (20 mEq total) by mouth daily.  . pregabalin (LYRICA) 200 MG capsule Take 1 capsule (200 mg total) by mouth 2 (two) times daily.  . rizatriptan (MAXALT) 10 MG tablet Take 10 mg by mouth daily as needed for migraine. Take one tablet at onset of migraine. Do not take more than 3 tabs max per week.  . topiramate (TOPAMAX) 25 MG tablet Take 3 tablets (75 mg total) by mouth at bedtime.  Marland Kitchen VASCEPA 1 g capsule TAKE 2 CAPSULES TWICE A DAY WITH MEALS     SIGNIFICANT DIAGNOSTIC EXAMS  PREVIOUS   04-23-20: ct of head:  Minimal periventricular small vessel disease. No acute infarct. No mass or hemorrhage. Foci of arterial vascular calcification noted. Mucosal thickening noted in several ethmoid air cells.  04-23-20: mri of lumbar spine:  1. 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 2. Similar multilevel degenerative change without significant central canal stenosis. Similar mild left foraminal stenosis at L4-L5.  04-23-20: mri of brain:  No acute infarction, hemorrhage, hydrocephalus, extra-axial collection or mass lesion. Scattered T2/FLAIR hyperintensities within the white  matter, compatible with chronic microvascular ischemic disease and appropriate for patient's age. Mild diffuse cerebral atrophy  04-23-20 mri cervical spine:  No evidence of cervical spine infection. Multilevel spondylosis. Patent spinal canal. Mild left C6-7 neural foraminal narrowing. Central/paracentral protrusions at the C4-5 and C5-6 level abutting the ventral cord. No evidence of myelomalacia.  04-22-20: mri thoracic spine:  No evidence of infection. Mild degenerative changes and chronic superior T11 endplate deformity with minimal height loss. Shallow left T10-11 paracentral protrusion. Patent spinal canal and neural foramen.  NO NEW EXAMS   LABS REVIEWED PREVIOUS    08-29-19: chol 128; ldl 71; trig 249; hdl 42 04-23-20: wbc 12.3; hgb 14.5; hct 41.9 mcv 90.1 plt 218; glucose 612; bun 35; creat 1.09; k+ 4.3; na++ 130; ca 9.4 liver normal albumin 3.9 hgb a1c 10.9 04-24-20: wbc 11.2; hgb 13.5; hct 40.0; mcv 91.1 plt 188; glucose 227; bun 27; creat 0.75; k+ 3.3; na++ 134; ca 8.4  04-30-20: k+ 3.9.   NO NEW LABS.   Review of Systems  Constitutional: Negative for malaise/fatigue.  Respiratory: Negative for cough and shortness of breath.   Cardiovascular: Negative for chest pain, palpitations and leg swelling.  Gastrointestinal: Negative for abdominal pain, constipation and heartburn.  Musculoskeletal: Negative for back pain, joint pain and myalgias.  Skin: Negative.   Neurological: Negative for dizziness.  Psychiatric/Behavioral: The patient is not nervous/anxious.    Physical Exam Constitutional:      General: She is not in acute distress.    Appearance: She is well-developed and well-nourished. She is obese. She is not diaphoretic.  Neck:     Thyroid: No thyromegaly.  Cardiovascular:     Rate and Rhythm: Normal rate and regular rhythm.     Pulses: Normal pulses and intact distal pulses.     Heart sounds: Normal heart sounds.  Pulmonary:     Effort: Pulmonary effort is  normal. No respiratory distress.     Breath sounds: Normal breath sounds.  Abdominal:     General: Bowel sounds are normal. There is no distension.     Palpations: Abdomen is soft.     Tenderness: There is no abdominal tenderness.  Musculoskeletal:        General: No edema. Normal range of motion.     Cervical back: Neck supple.     Right lower leg: No edema.     Left lower leg: No edema.  Lymphadenopathy:     Cervical: No cervical adenopathy.  Skin:    General: Skin is warm and dry.  Neurological:     Mental Status: She is alert and oriented to person, place, and time.  Psychiatric:        Mood and Affect: Mood and affect and mood normal.       ASSESSMENT/ PLAN:  TODAY  1. Chronic diastolic heart failure 2. Chronic respiratory failure with hypoxia 3. Bipolar affective disorder currently manic moderate 4. Chronic bilateral low back pain with bilateral sciatica   Will continue therapy as directed Will continue medications as directed Will continue current plan of care Her goal remains to return home   MD is aware of resident's narcotic use and is in agreement with current plan of care. We will attempt to wean resident as appropriate.  Ok Edwards NP [facility]  Contact [phone] Monday through Friday 8am- 5pm  After hours call [phone]

## 2020-05-11 ENCOUNTER — Non-Acute Institutional Stay (SKILLED_NURSING_FACILITY): Payer: Medicare Other | Admitting: Adult Health

## 2020-05-11 ENCOUNTER — Encounter: Payer: Self-pay | Admitting: Adult Health

## 2020-05-11 ENCOUNTER — Other Ambulatory Visit: Payer: Self-pay | Admitting: Adult Health

## 2020-05-11 DIAGNOSIS — Z09 Encounter for follow-up examination after completed treatment for conditions other than malignant neoplasm: Secondary | ICD-10-CM

## 2020-05-11 DIAGNOSIS — F3112 Bipolar disorder, current episode manic without psychotic features, moderate: Secondary | ICD-10-CM | POA: Diagnosis not present

## 2020-05-11 DIAGNOSIS — M5441 Lumbago with sciatica, right side: Secondary | ICD-10-CM

## 2020-05-11 DIAGNOSIS — M5442 Lumbago with sciatica, left side: Secondary | ICD-10-CM | POA: Diagnosis not present

## 2020-05-11 DIAGNOSIS — R0600 Dyspnea, unspecified: Secondary | ICD-10-CM

## 2020-05-11 DIAGNOSIS — I5032 Chronic diastolic (congestive) heart failure: Secondary | ICD-10-CM | POA: Diagnosis not present

## 2020-05-11 DIAGNOSIS — I482 Chronic atrial fibrillation, unspecified: Secondary | ICD-10-CM

## 2020-05-11 DIAGNOSIS — G8929 Other chronic pain: Secondary | ICD-10-CM | POA: Diagnosis not present

## 2020-05-11 DIAGNOSIS — F3130 Bipolar disorder, current episode depressed, mild or moderate severity, unspecified: Secondary | ICD-10-CM

## 2020-05-11 DIAGNOSIS — J9611 Chronic respiratory failure with hypoxia: Secondary | ICD-10-CM

## 2020-05-11 DIAGNOSIS — I4821 Permanent atrial fibrillation: Secondary | ICD-10-CM

## 2020-05-11 DIAGNOSIS — E782 Mixed hyperlipidemia: Secondary | ICD-10-CM

## 2020-05-11 DIAGNOSIS — R0609 Other forms of dyspnea: Secondary | ICD-10-CM

## 2020-05-11 DIAGNOSIS — E1169 Type 2 diabetes mellitus with other specified complication: Secondary | ICD-10-CM

## 2020-05-11 MED ORDER — POTASSIUM CHLORIDE CRYS ER 20 MEQ PO TBCR: 20.0000 meq | EXTENDED_RELEASE_TABLET | Freq: Every day | ORAL | 0 refills | Status: DC

## 2020-05-11 MED ORDER — APIXABAN 5 MG PO TABS: 5.0000 mg | ORAL_TABLET | Freq: Two times a day (BID) | ORAL | 0 refills | Status: DC

## 2020-05-11 MED ORDER — INSULIN ASPART 100 UNIT/ML ~~LOC~~ SOLN: 10.0000 [IU] | Freq: Three times a day (TID) | SUBCUTANEOUS | 0 refills | Status: DC

## 2020-05-11 MED ORDER — ESCITALOPRAM OXALATE 10 MG PO TABS: 10.0000 mg | ORAL_TABLET | Freq: Every day | ORAL | 0 refills | Status: DC

## 2020-05-11 MED ORDER — ICOSAPENT ETHYL 1 G PO CAPS
ORAL_CAPSULE | ORAL | 0 refills | Status: DC
Start: 2020-05-11 — End: 2020-07-19

## 2020-05-11 MED ORDER — ALBUTEROL SULFATE (2.5 MG/3ML) 0.083% IN NEBU: 2.5000 mg | INHALATION_SOLUTION | Freq: Four times a day (QID) | RESPIRATORY_TRACT | 0 refills | Status: DC | PRN

## 2020-05-11 MED ORDER — ALBUTEROL SULFATE HFA 108 (90 BASE) MCG/ACT IN AERS: 2.0000 | INHALATION_SPRAY | Freq: Four times a day (QID) | RESPIRATORY_TRACT | 0 refills | Status: DC | PRN

## 2020-05-11 MED ORDER — DILTIAZEM HCL ER COATED BEADS 120 MG PO CP24: 120.0000 mg | ORAL_CAPSULE | Freq: Every day | ORAL | 0 refills | Status: DC

## 2020-05-11 MED ORDER — LORAZEPAM 1 MG PO TABS: 1.0000 mg | ORAL_TABLET | ORAL | 0 refills | Status: DC

## 2020-05-11 MED ORDER — PREGABALIN 200 MG PO CAPS: 200.0000 mg | ORAL_CAPSULE | Freq: Two times a day (BID) | ORAL | 0 refills | Status: DC

## 2020-05-11 MED ORDER — ATORVASTATIN CALCIUM 40 MG PO TABS: 40.0000 mg | ORAL_TABLET | Freq: Every day | ORAL | 0 refills | Status: DC

## 2020-05-11 MED ORDER — TOPIRAMATE 25 MG PO TABS: 75.0000 mg | ORAL_TABLET | Freq: Every day | ORAL | 0 refills | Status: DC

## 2020-05-11 MED ORDER — METFORMIN HCL 500 MG PO TABS: 500.0000 mg | ORAL_TABLET | Freq: Every day | ORAL | 0 refills | Status: DC

## 2020-05-11 MED ORDER — OXYCODONE-ACETAMINOPHEN 5-325 MG PO TABS: 1.0000 | ORAL_TABLET | Freq: Two times a day (BID) | ORAL | 0 refills | Status: DC | PRN

## 2020-05-11 MED ORDER — METOPROLOL TARTRATE 25 MG PO TABS: 25.0000 mg | ORAL_TABLET | Freq: Two times a day (BID) | ORAL | 0 refills | Status: DC

## 2020-05-11 MED ORDER — BUDESONIDE-FORMOTEROL FUMARATE 80-4.5 MCG/ACT IN AERO
2.0000 | INHALATION_SPRAY | Freq: Two times a day (BID) | RESPIRATORY_TRACT | 0 refills | Status: DC
Start: 2020-05-11 — End: 2020-05-29

## 2020-05-11 MED ORDER — LANTUS SOLOSTAR 100 UNIT/ML ~~LOC~~ SOPN: 55.0000 [IU] | PEN_INJECTOR | Freq: Two times a day (BID) | SUBCUTANEOUS | 0 refills | Status: DC

## 2020-05-11 MED ORDER — FUROSEMIDE 40 MG PO TABS: ORAL_TABLET | ORAL | 0 refills | Status: DC

## 2020-05-11 MED ORDER — RIZATRIPTAN BENZOATE 10 MG PO TABS: 10.0000 mg | ORAL_TABLET | Freq: Every day | ORAL | 0 refills | Status: DC | PRN

## 2020-05-11 NOTE — Progress Notes (Signed)
Location:  [facility] Room Number: 158-P Place of Service:  SNF (31)    CODE STATUS: FULL CODE  Allergies  Allergen Reactions  . Ivp Dye [Iodinated Diagnostic Agents] Anaphylaxis and Swelling    Throat closes   . Latex     Latex tape pulls skin with it    Chief Complaint  Patient presents with  . Discharge Note    Patient is seen for discharge home on 05/14/20    HPI:  She is being discharged to home with home health for pt/ot. She will not need any dme. She will need her prescriptions written and will need to follow up with her medical provider. She had been hospitalized due to increased weakness. She was admitted to this facility for short term rehab. She has participated in both pt and ot and has improved upon her level of independence with her adls. She is now ready to complete therapy on a home health basis.    Past Medical History:  Diagnosis Date  . Atrial fibrillation with RVR 05/19/2017  . Bipolar I disorder 01/23/2015  . CAD (coronary artery disease)    Nonobstructive; Managed by Dr. Bronson Ing  . Cardiomegaly 01/12/2018  . Chronic anticoagulation 05/30/2015  . Chronic back pain    Lower back  . Chronic diastolic CHF (congestive heart failure) 05/30/2015  . Diabetes mellitus, type 2, without complication   . Diabetic neuropathy 02/06/2016  . Dysrhythmia    A-Fib  . Essential hypertension   . Herpes genitalis in women 07/16/2015  . Hyperlipidemia   . Hypertension   . Hypoxia 02/01/2019  . Insomnia 01/23/2015  . Lipoma 02/08/2015  . Major depressive disorder   . Migraine headache with aura 02/12/2016  . 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[place]) 04/21/2019  . NAFL (nonalcoholic fatty liver) 0/73/7106  . OSA (obstructive sleep apnea) 02/24/2019   10/09/2018 - HST  - AHI 40.6   . Pulmonary hypertension   . Renal insufficiency    Managed by Dr. Wallace Keller  . RLS (restless legs syndrome) 04/27/2015    Past Surgical History:  Procedure  Laterality Date  . BREAST REDUCTION SURGERY    . EYE SURGERY Right    cateracts  . HAMMER TOE SURGERY    . LEFT HEART CATH AND CORONARY ANGIOGRAPHY N/A 02/03/2018   Procedure: LEFT HEART CATH AND CORONARY ANGIOGRAPHY;  Surgeon: Martinique, Peter M, MD;  Location: [facility];  Service: Cardiovascular;  Laterality: N/A;  . REVERSE SHOULDER ARTHROPLASTY Right 08/19/2019   Procedure: REVERSE SHOULDER ARTHROPLASTY;  Surgeon: Netta Cedars, MD;  Location: [facility];  Service: Orthopedics;  Laterality: Right;  interscalene block  . SHOULDER SURGERY Right    "I BROKE MY SHOUDLER  . THIGH SURGERY     "TO REMOVE A TUMOR "    Social History   Socioeconomic History  . Marital status: Married    Spouse name: Orpah Greek  . Number of children: 3  . Years of education: 43  . Highest education level: Bachelor's degree (e.g., BA, AB, BS)  Occupational History  . Occupation: Retired    Comment: marketing  Tobacco Use  . Smoking status: Never Smoker  . Smokeless tobacco: Never Used  Vaping Use  . Vaping Use: Never used  Substance and Sexual Activity  . Alcohol use: Not Currently    Alcohol/week: 0.0 standard drinks  . Drug use: No  . Sexual activity: Yes    Partners: Male    Birth control/protection: Post-menopausal  Other  Topics Concern  . Not on file  Social History Narrative   Lives at home with husband.       Right handed   Social Determinants of Health   Financial Resource Strain: Not on file  Food Insecurity: Not on file  Transportation Needs: Not on file  Physical Activity: Not on file  Stress: Not on file  Social Connections: Not on file  Intimate Partner Violence: Not on file   Family History  Problem Relation Age of Onset  . Diabetes Mother   . Heart disease Mother   . Heart disease Brother   . Heart disease Sister        CABG  . Diabetes Sister   . Alzheimer's disease Father   . Alcohol abuse Sister   . Mental illness Brother   . Diabetes Brother     VITAL  SIGNS BP 105/70   Pulse 71   Temp (!) 97.3 F (36.3 C) (Oral)   Resp 16   Ht 5\' 2"  (1.575 m)   Wt 197 lb 3.2 oz (89.4 kg)   SpO2 96%   BMI 36.07 kg/m   Patient's Medications  New Prescriptions   No medications on file  Previous Medications   ACETAMINOPHEN (TYLENOL) 325 MG TABLET    Take 2 tablets (650 mg total) by mouth every 6 (six) hours as needed for mild pain, fever or headache (or Fever >/= 101).   ALBUTEROL (PROVENTIL) (2.5 MG/3ML) 0.083% NEBULIZER SOLUTION    Take 3 mLs (2.5 mg total) by nebulization every 6 (six) hours as needed for wheezing or shortness of breath.   ALBUTEROL (VENTOLIN HFA) 108 (90 BASE) MCG/ACT INHALER    Inhale 2 puffs into the lungs every 6 (six) hours as needed for wheezing or shortness of breath.    ATORVASTATIN (LIPITOR) 40 MG TABLET    Take 1 tablet (40 mg total) by mouth daily.   BUDESONIDE-FORMOTEROL (SYMBICORT) 80-4.5 MCG/ACT INHALER    Inhale 2 puffs into the lungs 2 (two) times daily.   CHOLECALCIFEROL (VITAMIN D3) 1000 UNITS CAPS    Take 1,000 Units by mouth daily.   DILTIAZEM (CARDIZEM CD) 120 MG 24 HR CAPSULE    TAKE 1 CAPSULE AT BEDTIME   ELIQUIS 5 MG TABS TABLET    TAKE 1 TABLET TWICE A DAY   ESCITALOPRAM (LEXAPRO) 10 MG TABLET    Take 1 tablet (10 mg total) by mouth daily.   FUROSEMIDE (LASIX) 40 MG TABLET    TAKE 1 TABLET EVERY MORNING  (DOSE INCREASED)   INSULIN ASPART (NOVOLOG) 100 UNIT/ML INJECTION    Inject 10 Units into the skin 3 (three) times daily before meals. Special Instructions: hold for cbg <120   INSULIN GLARGINE (LANTUS SOLOSTAR) 100 UNIT/ML SOLOSTAR PEN    Inject 55 Units into the skin 2 (two) times daily.   LORAZEPAM (ATIVAN) 1 MG TABLET    Take 1-2 mg by mouth See admin instructions. 1 mg QAM and 2 mg QHS   METFORMIN (GLUCOPHAGE) 500 MG TABLET    Take 500 mg by mouth daily. Take with lunch   METOPROLOL TARTRATE (LOPRESSOR) 25 MG TABLET    Take 25 mg by mouth 2 (two) times daily.   MULTIPLE VITAMIN (MULTIVITAMIN WITH  MINERALS) TABS TABLET    Take 1 tablet by mouth daily.   NON FORMULARY    Diet: _____ Regular, ___x___ NAS, ___x____Consistent Carbohydrate, _______NPO _____Other   OXYCODONE-ACETAMINOPHEN (PERCOCET/ROXICET) 5-325 MG TABLET    Take 1 tablet by  mouth 2 (two) times daily as needed for severe pain.   POLYETHYLENE GLYCOL (MIRALAX / GLYCOLAX) 17 G PACKET    Take 17 g by mouth daily.   POTASSIUM CHLORIDE SA (KLOR-CON) 20 MEQ TABLET    Take 1 tablet (20 mEq total) by mouth daily.   PREGABALIN (LYRICA) 200 MG CAPSULE    Take 1 capsule (200 mg total) by mouth 2 (two) times daily.   RIZATRIPTAN (MAXALT) 10 MG TABLET    Take 10 mg by mouth daily as needed for migraine. Take one tablet at onset of migraine. Do not take more than 3 tabs max per week.   TOPIRAMATE (TOPAMAX) 25 MG TABLET    Take 3 tablets (75 mg total) by mouth at bedtime.   VASCEPA 1 G CAPSULE    TAKE 2 CAPSULES TWICE A DAY WITH MEALS  Modified Medications   No medications on file  Discontinued Medications   No medications on file     SIGNIFICANT DIAGNOSTIC EXAMS  PREVIOUS   04-23-20: ct of head:  Minimal periventricular small vessel disease. No acute infarct. No mass or hemorrhage. Foci of arterial vascular calcification noted. Mucosal thickening noted in several ethmoid air cells.  04-23-20: mri of lumbar spine:  1. 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 2. Similar multilevel degenerative change without significant central canal stenosis. Similar mild left foraminal stenosis at L4-L5.  04-23-20: mri of brain:  No acute infarction, hemorrhage, hydrocephalus, extra-axial collection or mass lesion. Scattered T2/FLAIR hyperintensities within the white matter, compatible with chronic microvascular ischemic disease and  appropriate for patient's age. Mild diffuse cerebral atrophy  04-23-20 mri cervical spine:  No evidence of cervical spine infection. Multilevel spondylosis. Patent spinal canal. Mild left C6-7 neural foraminal narrowing. Central/paracentral protrusions at the C4-5 and C5-6 level abutting the ventral cord. No evidence of myelomalacia.  04-22-20: mri thoracic spine:  No evidence of infection. Mild degenerative changes and chronic superior T11 endplate deformity with minimal height loss. Shallow left T10-11 paracentral protrusion. Patent spinal canal and neural foramen.  NO NEW EXAMS   LABS REVIEWED PREVIOUS    08-29-19: chol 128; ldl 71; trig 249; hdl 42 04-23-20: wbc 12.3; hgb 14.5; hct 41.9 mcv 90.1 plt 218; glucose 612; bun 35; creat 1.09; k+ 4.3; na++ 130; ca 9.4 liver normal albumin 3.9 hgb a1c 10.9 04-24-20: wbc 11.2; hgb 13.5; hct 40.0; mcv 91.1 plt 188; glucose 227; bun 27; creat 0.75; k+ 3.3; na++ 134; ca 8.4  04-30-20: k+ 3.9.   NO NEW LABS.    Review of Systems  Constitutional: Negative for malaise/fatigue.  Respiratory: Negative for cough and shortness of breath.   Cardiovascular: Negative for chest pain, palpitations and leg swelling.  Gastrointestinal: Negative for abdominal pain, constipation and heartburn.  Musculoskeletal: Negative for back pain, joint pain and myalgias.  Skin: Negative.   Neurological: Negative for dizziness.  Psychiatric/Behavioral: The patient is not nervous/anxious.    Physical Exam Constitutional:      General: She is not in acute distress.    Appearance: She is well-developed and well-nourished. She is obese. She is not diaphoretic.  Neck:     Thyroid: No thyromegaly.  Cardiovascular:     Rate and Rhythm: Normal rate and regular rhythm.     Pulses: Normal pulses and intact distal pulses.  Heart sounds: Normal heart sounds.  Pulmonary:     Effort: Pulmonary effort is normal. No respiratory distress.     Breath sounds: Normal breath  sounds.  Abdominal:     General: Bowel sounds are normal. There is no distension.     Palpations: Abdomen is soft.     Tenderness: There is no abdominal tenderness.  Musculoskeletal:        General: No edema. Normal range of motion.     Cervical back: Neck supple.     Right lower leg: No edema.     Left lower leg: No edema.  Lymphadenopathy:     Cervical: No cervical adenopathy.  Skin:    General: Skin is warm and dry.  Neurological:     Mental Status: She is alert and oriented to person, place, and time.  Psychiatric:        Mood and Affect: Mood and affect and mood normal.      ASSESSMENT/ PLAN:  Patient is being discharged with the following home health services:  Pt/ot to evaluate and treat as indicated for gait balance strengthening and adl training.    Patient is being discharged with the following durable medical equipment:  None needed   Patient has been advised to f/u with their PCP in 1-2 weeks to bring them up to date on their rehab stay.  Social services at facility was responsible for arranging this appointment.  Pt was provided with a 30 day supply of prescriptions for medications and refills must be obtained from their PCP.  For controlled substances, a more limited supply may be provided adequate until PCP appointment only.   A 30 day supply of prescription medications with #10 percocet 5/325 mg tabs; #60 lyrica 200 mg tabs; #90 ativan 1 mg tabs to [facility] in [place]  Time spent with patient: 35 minutes: medications dme; home health.   Ok Edwards NP [facility]  Contact [phone] Monday through Friday 8am- 5pm  After hours call [phone]

## 2020-05-16 DIAGNOSIS — I482 Chronic atrial fibrillation, unspecified: Secondary | ICD-10-CM | POA: Diagnosis not present

## 2020-05-16 DIAGNOSIS — J449 Chronic obstructive pulmonary disease, unspecified: Secondary | ICD-10-CM | POA: Diagnosis not present

## 2020-05-16 DIAGNOSIS — I2510 Atherosclerotic heart disease of native coronary artery without angina pectoris: Secondary | ICD-10-CM | POA: Diagnosis not present

## 2020-05-16 DIAGNOSIS — M461 Sacroiliitis, not elsewhere classified: Secondary | ICD-10-CM | POA: Diagnosis not present

## 2020-05-16 DIAGNOSIS — R262 Difficulty in walking, not elsewhere classified: Secondary | ICD-10-CM | POA: Diagnosis not present

## 2020-05-16 DIAGNOSIS — F319 Bipolar disorder, unspecified: Secondary | ICD-10-CM | POA: Diagnosis not present

## 2020-05-16 DIAGNOSIS — I5032 Chronic diastolic (congestive) heart failure: Secondary | ICD-10-CM | POA: Diagnosis not present

## 2020-05-16 DIAGNOSIS — E1165 Type 2 diabetes mellitus with hyperglycemia: Secondary | ICD-10-CM | POA: Diagnosis not present

## 2020-05-16 DIAGNOSIS — I110 Hypertensive heart disease with heart failure: Secondary | ICD-10-CM | POA: Diagnosis not present

## 2020-05-16 DIAGNOSIS — Z7984 Long term (current) use of oral hypoglycemic drugs: Secondary | ICD-10-CM | POA: Diagnosis not present

## 2020-05-16 DIAGNOSIS — E1140 Type 2 diabetes mellitus with diabetic neuropathy, unspecified: Secondary | ICD-10-CM | POA: Diagnosis not present

## 2020-05-16 DIAGNOSIS — M797 Fibromyalgia: Secondary | ICD-10-CM | POA: Diagnosis not present

## 2020-05-16 DIAGNOSIS — R296 Repeated falls: Secondary | ICD-10-CM | POA: Diagnosis not present

## 2020-05-16 DIAGNOSIS — Z7901 Long term (current) use of anticoagulants: Secondary | ICD-10-CM | POA: Diagnosis not present

## 2020-05-16 DIAGNOSIS — M6281 Muscle weakness (generalized): Secondary | ICD-10-CM | POA: Diagnosis not present

## 2020-05-16 DIAGNOSIS — G894 Chronic pain syndrome: Secondary | ICD-10-CM | POA: Diagnosis not present

## 2020-05-17 ENCOUNTER — Ambulatory Visit: Payer: Medicare Other | Admitting: *Deleted

## 2020-05-17 DIAGNOSIS — G894 Chronic pain syndrome: Secondary | ICD-10-CM | POA: Diagnosis not present

## 2020-05-17 DIAGNOSIS — M461 Sacroiliitis, not elsewhere classified: Secondary | ICD-10-CM | POA: Diagnosis not present

## 2020-05-17 DIAGNOSIS — E1140 Type 2 diabetes mellitus with diabetic neuropathy, unspecified: Secondary | ICD-10-CM | POA: Diagnosis not present

## 2020-05-17 DIAGNOSIS — E1165 Type 2 diabetes mellitus with hyperglycemia: Secondary | ICD-10-CM | POA: Diagnosis not present

## 2020-05-17 DIAGNOSIS — I5032 Chronic diastolic (congestive) heart failure: Secondary | ICD-10-CM | POA: Diagnosis not present

## 2020-05-17 DIAGNOSIS — I110 Hypertensive heart disease with heart failure: Secondary | ICD-10-CM | POA: Diagnosis not present

## 2020-05-17 NOTE — Chronic Care Management (AMB) (Signed)
   Care Management   Note  05/17/2020 Name: Amber Stephenson MRN: [id number] DOB: 04-15-1946  Received Red EMMI alert from automated telephone call placed to patient on 05/16/20. Patient reported that she had not picked up her prescribed medications. I talked with Amber Stephenson today and verified that she will be picking up her medications today. She did not have any questions or concerns. Verified upcoming appointment with PCP on 05/29/20 and made an appt note that she was discharged from SNF on 05/11/20. Encouraged patient to reach out to PCP or seek emergency medical services if needed. Advised that she may receive another automated EMMI call and that if she answers positively to any questions, that she will likely get a follow-up call from me.     Follow up plan: Next PCP appointment scheduled for:  05/29/20 with Amber Stephenson  Chong Sicilian, BSN, RN-BC [place] [facility] [place] [facility] Direct Dial: [phone]

## 2020-05-17 NOTE — Patient Instructions (Signed)
Visit Information  The patient verbalized understanding of instructions, educational materials, and care plan provided today and declined offer to receive copy of patient instructions, educational materials, and care plan.   Follow-up Plan Next PCP appointment scheduled for:  05/29/20 with Dr Livia Snellen  Chong Sicilian, BSN, RN-BC [place] [facility] [place] [facility] Direct Dial: [phone]

## 2020-05-18 ENCOUNTER — Telehealth: Payer: Self-pay | Admitting: *Deleted

## 2020-05-18 NOTE — Telephone Encounter (Signed)
Received call from [facility] with [facility] stating that she received a call from patient stating that she had a fall this morning.  Patient fell on left side, has scraped knee and bilateral torso pain.  Patient did not call EMS or go to the ER.  Patients family was there to help patient up, patient did not hit head.  Home health nurse advised patient to be seen in ER if pain worsens

## 2020-05-22 DIAGNOSIS — E1165 Type 2 diabetes mellitus with hyperglycemia: Secondary | ICD-10-CM | POA: Diagnosis not present

## 2020-05-22 DIAGNOSIS — M461 Sacroiliitis, not elsewhere classified: Secondary | ICD-10-CM | POA: Diagnosis not present

## 2020-05-22 DIAGNOSIS — E1140 Type 2 diabetes mellitus with diabetic neuropathy, unspecified: Secondary | ICD-10-CM | POA: Diagnosis not present

## 2020-05-22 DIAGNOSIS — I110 Hypertensive heart disease with heart failure: Secondary | ICD-10-CM | POA: Diagnosis not present

## 2020-05-22 DIAGNOSIS — G894 Chronic pain syndrome: Secondary | ICD-10-CM | POA: Diagnosis not present

## 2020-05-22 DIAGNOSIS — I5032 Chronic diastolic (congestive) heart failure: Secondary | ICD-10-CM | POA: Diagnosis not present

## 2020-05-24 DIAGNOSIS — E1165 Type 2 diabetes mellitus with hyperglycemia: Secondary | ICD-10-CM | POA: Diagnosis not present

## 2020-05-24 DIAGNOSIS — M461 Sacroiliitis, not elsewhere classified: Secondary | ICD-10-CM | POA: Diagnosis not present

## 2020-05-24 DIAGNOSIS — I5032 Chronic diastolic (congestive) heart failure: Secondary | ICD-10-CM | POA: Diagnosis not present

## 2020-05-24 DIAGNOSIS — G894 Chronic pain syndrome: Secondary | ICD-10-CM | POA: Diagnosis not present

## 2020-05-24 DIAGNOSIS — I110 Hypertensive heart disease with heart failure: Secondary | ICD-10-CM | POA: Diagnosis not present

## 2020-05-24 DIAGNOSIS — E1140 Type 2 diabetes mellitus with diabetic neuropathy, unspecified: Secondary | ICD-10-CM | POA: Diagnosis not present

## 2020-05-28 ENCOUNTER — Telehealth: Payer: Self-pay

## 2020-05-28 ENCOUNTER — Ambulatory Visit: Payer: Medicare Other

## 2020-05-28 ENCOUNTER — Other Ambulatory Visit: Payer: Self-pay

## 2020-05-29 ENCOUNTER — Other Ambulatory Visit: Payer: Self-pay

## 2020-05-29 ENCOUNTER — Ambulatory Visit (INDEPENDENT_AMBULATORY_CARE_PROVIDER_SITE_OTHER): Payer: Medicare Other | Admitting: Family Medicine

## 2020-05-29 ENCOUNTER — Encounter: Payer: Self-pay | Admitting: Family Medicine

## 2020-05-29 VITALS — BP 111/75 | HR 61 | Temp 97.4°F | Ht 62.0 in | Wt 200.0 lb

## 2020-05-29 DIAGNOSIS — M7918 Myalgia, other site: Secondary | ICD-10-CM | POA: Diagnosis not present

## 2020-05-29 DIAGNOSIS — E11649 Type 2 diabetes mellitus with hypoglycemia without coma: Secondary | ICD-10-CM

## 2020-05-29 DIAGNOSIS — R262 Difficulty in walking, not elsewhere classified: Secondary | ICD-10-CM | POA: Diagnosis not present

## 2020-05-29 DIAGNOSIS — I2510 Atherosclerotic heart disease of native coronary artery without angina pectoris: Secondary | ICD-10-CM

## 2020-05-29 DIAGNOSIS — J4520 Mild intermittent asthma, uncomplicated: Secondary | ICD-10-CM | POA: Diagnosis not present

## 2020-05-29 MED ORDER — BUDESONIDE-FORMOTEROL FUMARATE 160-4.5 MCG/ACT IN AERO: 2.0000 | INHALATION_SPRAY | Freq: Two times a day (BID) | RESPIRATORY_TRACT | 5 refills | Status: DC

## 2020-05-29 MED ORDER — LANTUS SOLOSTAR 100 UNIT/ML ~~LOC~~ SOPN: 50.0000 [IU] | PEN_INJECTOR | Freq: Two times a day (BID) | SUBCUTANEOUS | 5 refills | Status: DC

## 2020-05-30 ENCOUNTER — Other Ambulatory Visit: Payer: Self-pay | Admitting: Family Medicine

## 2020-05-30 ENCOUNTER — Telehealth: Payer: Self-pay

## 2020-05-30 ENCOUNTER — Other Ambulatory Visit (HOSPITAL_COMMUNITY): Payer: Self-pay | Admitting: Psychiatry

## 2020-05-30 DIAGNOSIS — G894 Chronic pain syndrome: Secondary | ICD-10-CM | POA: Diagnosis not present

## 2020-05-30 DIAGNOSIS — I110 Hypertensive heart disease with heart failure: Secondary | ICD-10-CM | POA: Diagnosis not present

## 2020-05-30 DIAGNOSIS — E1165 Type 2 diabetes mellitus with hyperglycemia: Secondary | ICD-10-CM | POA: Diagnosis not present

## 2020-05-30 DIAGNOSIS — I5032 Chronic diastolic (congestive) heart failure: Secondary | ICD-10-CM | POA: Diagnosis not present

## 2020-05-30 DIAGNOSIS — M461 Sacroiliitis, not elsewhere classified: Secondary | ICD-10-CM | POA: Diagnosis not present

## 2020-05-30 DIAGNOSIS — B372 Candidiasis of skin and nail: Secondary | ICD-10-CM

## 2020-05-30 DIAGNOSIS — E1140 Type 2 diabetes mellitus with diabetic neuropathy, unspecified: Secondary | ICD-10-CM | POA: Diagnosis not present

## 2020-05-30 NOTE — Telephone Encounter (Signed)
Dr. Darlyn Read,  Symbicort is not covered by patients insurance plan. They will cover Advair Diskus/Advair HFA.  Please send one or the other to [facility] in [place] if you approve.   Was not sure what dosage you wanted. So double check the pended med please. Thank you.

## 2020-05-30 NOTE — Progress Notes (Signed)
Subjective:  Patient ID: Amber Stephenson, female    DOB: 08/31/1945  Age: 74 y.o. MRN: [id number]  CC: Hospitalization Follow-up   HPI EMPRESS NEWMANN presents for recent hospitalization at [facility].  Subsequently she went to the and rehab center.  She states that she is a little bit better but it is still hard to walk that would like to have some physical therapy at home a therapist has been ordered and came out one time.  They think that they will be coming out more later this week.  She has been concerned about blood sugar dropping too low.  That makes her feel dizzy shaky and nauseous.  She needs something to get better.  Of note is that she has been short of breath as well.  She says it is hard to breathe at times.  The Symbicort does not seem to be holding her.  Depression screen [facility] 2/9 05/29/2020 04/19/2020 04/02/2020  Decreased Interest 0 0 0  Down, Depressed, Hopeless 0 0 0  PHQ - 2 Score 0 0 0  Altered sleeping - - -  Tired, decreased energy - - -  Change in appetite - - -  Feeling bad or failure about yourself  - - -  Trouble concentrating - - -  Moving slowly or fidgety/restless - - -  Suicidal thoughts - - -  PHQ-9 Score - - -  Difficult doing work/chores - - -  Some recent data might be hidden    History Cana has a past medical history of Atrial fibrillation with RVR (05/19/2017), Bipolar I disorder (01/23/2015), CAD (coronary artery disease), Cardiomegaly (01/12/2018), Chronic anticoagulation (05/30/2015), Chronic back pain, Chronic diastolic CHF (congestive heart failure) (05/30/2015), Diabetes mellitus, type 2, without complication, Diabetic neuropathy (02/06/2016), Dysrhythmia, Essential hypertension, Herpes genitalis in women (07/16/2015), Hyperlipidemia, Hypertension, Hypoxia (02/01/2019), Insomnia (01/23/2015), Lipoma (02/08/2015), Major depressive disorder, Migraine headache with aura (02/12/2016), 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[place]) (04/21/2019),  NAFL (nonalcoholic fatty liver) (09/26/8339), OSA (obstructive sleep apnea) (02/24/2019), Pulmonary hypertension, Renal insufficiency, and RLS (restless legs syndrome) (04/27/2015).   She has a past surgical history that includes THIGH SURGERY; Shoulder surgery (Right); Breast reduction surgery; Eye surgery (Right); Hammer toe surgery; LEFT HEART CATH AND CORONARY ANGIOGRAPHY (N/A, 02/03/2018); and Reverse shoulder arthroplasty (Right, 08/19/2019).   Her family history includes Alcohol abuse in her sister; Alzheimer's disease in her father; Diabetes in her brother, mother, and sister; Heart disease in her brother, mother, and sister; Mental illness in her brother.She reports that she has never smoked. She has never used smokeless tobacco. She reports previous alcohol use. She reports that she does not use drugs.    ROS Review of Systems  Constitutional: Negative.   HENT: Negative.   Eyes: Negative for visual disturbance.  Respiratory: Negative for shortness of breath.   Cardiovascular: Negative for chest pain.  Gastrointestinal: Negative for abdominal pain.  Musculoskeletal: Negative for arthralgias.    Objective:  BP 111/75   Pulse 61   Temp (!) 97.4 F (36.3 C) (Temporal)   Ht 5' 2"  (1.575 m)   Wt 200 lb (90.7 kg)   BMI 36.58 kg/m   BP Readings from Last 3 Encounters:  05/29/20 111/75  05/11/20 105/70  05/10/20 105/70    Wt Readings from Last 3 Encounters:  05/29/20 200 lb (90.7 kg)  05/11/20 197 lb 3.2 oz (89.4 kg)  05/10/20 196 lb 3.2 oz (89 kg)     Physical Exam Constitutional:  General: She is not in acute distress.    Appearance: She is well-developed. She is obese. She is ill-appearing.  HENT:     Head: Normocephalic and atraumatic.  Eyes:     Conjunctiva/sclera: Conjunctivae normal.     Pupils: Pupils are equal, round, and reactive to light.  Neck:     Thyroid: No thyromegaly.  Cardiovascular:     Rate and Rhythm: Normal rate and regular rhythm.      Heart sounds: Normal heart sounds. No murmur heard.   Pulmonary:     Effort: Pulmonary effort is normal. No respiratory distress.     Breath sounds: Normal breath sounds. No wheezing or rales.  Abdominal:     General: Bowel sounds are normal. There is no distension.     Palpations: Abdomen is soft.     Tenderness: There is no abdominal tenderness.  Musculoskeletal:        General: Normal range of motion.     Cervical back: Normal range of motion and neck supple.  Lymphadenopathy:     Cervical: No cervical adenopathy.  Skin:    General: Skin is warm and dry.  Neurological:     Mental Status: She is alert and oriented to person, place, and time.  Psychiatric:        Behavior: Behavior normal.       Assessment & Plan:   Seniah was seen today for hospitalization follow-up.  Diagnoses and all orders for this visit:  Diabetic hypoglycemia ([place]) -     CBC with Differential/Platelet -     CMP14+EGFR  Ambulatory dysfunction--back pain and falls  Myofascial pain dysfunction syndrome  Mild intermittent asthma without complication  Other orders -     insulin glargine (LANTUS SOLOSTAR) 100 UNIT/ML Solostar Pen; Inject 50 Units into the skin 2 (two) times daily. -     budesonide-formoterol (SYMBICORT) 160-4.5 MCG/ACT inhaler; Inhale 2 puffs into the lungs 2 (two) times daily.       I have discontinued Jessenia Filippone. Laumann's budesonide-formoterol and metFORMIN. I have also changed her Lantus SoloStar. Additionally, I am having her start on budesonide-formoterol. Lastly, I am having her maintain her Vitamin D3, acetaminophen, multivitamin with minerals, polyethylene glycol, NON FORMULARY, albuterol, albuterol, atorvastatin, diltiazem, apixaban, escitalopram, furosemide, insulin aspart, LORazepam, metoprolol tartrate, oxyCODONE-acetaminophen, potassium chloride SA, pregabalin, rizatriptan, topiramate, icosapent Ethyl, and flurazepam.  Allergies as of 05/29/2020      Reactions    Ivp Dye [iodinated Diagnostic Agents] Anaphylaxis, Swelling   Throat closes   Latex    Latex tape pulls skin with it      Medication List       Accurate as of May 29, 2020 11:59 PM. If you have any questions, ask your nurse or doctor.        STOP taking these medications   budesonide-formoterol 80-4.5 MCG/ACT inhaler Commonly known as: Symbicort Replaced by: budesonide-formoterol 160-4.5 MCG/ACT inhaler Stopped by: [name], MD   metFORMIN 500 MG tablet Commonly known as: GLUCOPHAGE Stopped by: [name], MD     TAKE these medications   acetaminophen 325 MG tablet Commonly known as: TYLENOL Take 2 tablets (650 mg total) by mouth every 6 (six) hours as needed for mild pain, fever or headache (or Fever >/= 101).   albuterol (2.5 MG/3ML) 0.083% nebulizer solution Commonly known as: PROVENTIL Take 3 mLs (2.5 mg total) by nebulization every 6 (six) hours as needed for wheezing or shortness of breath.   albuterol 108 (90 Base) MCG/ACT inhaler  Commonly known as: VENTOLIN HFA Inhale 2 puffs into the lungs every 6 (six) hours as needed for wheezing or shortness of breath.   apixaban 5 MG Tabs tablet Commonly known as: Eliquis Take 1 tablet (5 mg total) by mouth 2 (two) times daily.   atorvastatin 40 MG tablet Commonly known as: LIPITOR Take 1 tablet (40 mg total) by mouth daily.   budesonide-formoterol 160-4.5 MCG/ACT inhaler Commonly known as: SYMBICORT Inhale 2 puffs into the lungs 2 (two) times daily. Replaces: budesonide-formoterol 80-4.5 MCG/ACT inhaler Started by: [name], MD   diltiazem 120 MG 24 hr capsule Commonly known as: CARDIZEM CD Take 1 capsule (120 mg total) by mouth at bedtime.   escitalopram 10 MG tablet Commonly known as: Lexapro Take 1 tablet (10 mg total) by mouth daily.   flurazepam 30 MG capsule Commonly known as: DALMANE Take 30 mg by mouth at bedtime as needed.   furosemide 40 MG tablet Commonly known as: LASIX TAKE  1 TABLET EVERY MORNING   icosapent Ethyl 1 g capsule Commonly known as: Vascepa TAKE 2 CAPSULES TWICE A DAY WITH MEALS   insulin aspart 100 UNIT/ML injection Commonly known as: novoLOG Inject 10 Units into the skin 3 (three) times daily before meals. Special Instructions: hold for cbg <120   Lantus SoloStar 100 UNIT/ML Solostar Pen Generic drug: insulin glargine Inject 50 Units into the skin 2 (two) times daily. What changed: how much to take Changed by: [name], MD   LORazepam 1 MG tablet Commonly known as: ATIVAN Take 1-2 tablets (1-2 mg total) by mouth See admin instructions. 1 mg QAM and 2 mg QHS   metoprolol tartrate 25 MG tablet Commonly known as: LOPRESSOR Take 1 tablet (25 mg total) by mouth 2 (two) times daily.   multivitamin with minerals Tabs tablet Take 1 tablet by mouth daily.   NON FORMULARY Diet: _____ Regular, ___x___ NAS, ___x____Consistent Carbohydrate, _______NPO _____Other   oxyCODONE-acetaminophen 5-325 MG tablet Commonly known as: PERCOCET/ROXICET Take 1 tablet by mouth 2 (two) times daily as needed for severe pain.   polyethylene glycol 17 g packet Commonly known as: MIRALAX / GLYCOLAX Take 17 g by mouth daily.   potassium chloride SA 20 MEQ tablet Commonly known as: KLOR-CON Take 1 tablet (20 mEq total) by mouth daily.   pregabalin 200 MG capsule Commonly known as: Lyrica Take 1 capsule (200 mg total) by mouth 2 (two) times daily.   rizatriptan 10 MG tablet Commonly known as: MAXALT Take 1 tablet (10 mg total) by mouth daily as needed for migraine. Take one tablet at onset of migraine. Do not take more than 3 tabs max per week.   topiramate 25 MG tablet Commonly known as: TOPAMAX Take 3 tablets (75 mg total) by mouth at bedtime.   Vitamin D3 25 MCG (1000 UT) Caps Take 1,000 Units by mouth daily.        Follow-up: Return in about 6 weeks (around 07/10/2020).  [name], M.D.

## 2020-05-31 ENCOUNTER — Other Ambulatory Visit: Payer: Self-pay | Admitting: Family Medicine

## 2020-05-31 DIAGNOSIS — M461 Sacroiliitis, not elsewhere classified: Secondary | ICD-10-CM | POA: Diagnosis not present

## 2020-05-31 DIAGNOSIS — I5032 Chronic diastolic (congestive) heart failure: Secondary | ICD-10-CM | POA: Diagnosis not present

## 2020-05-31 DIAGNOSIS — E1140 Type 2 diabetes mellitus with diabetic neuropathy, unspecified: Secondary | ICD-10-CM | POA: Diagnosis not present

## 2020-05-31 DIAGNOSIS — I110 Hypertensive heart disease with heart failure: Secondary | ICD-10-CM | POA: Diagnosis not present

## 2020-05-31 DIAGNOSIS — E1165 Type 2 diabetes mellitus with hyperglycemia: Secondary | ICD-10-CM | POA: Diagnosis not present

## 2020-05-31 DIAGNOSIS — G894 Chronic pain syndrome: Secondary | ICD-10-CM | POA: Diagnosis not present

## 2020-05-31 MED ORDER — FLUTICASONE-SALMETEROL 250-50 MCG/DOSE IN AEPB: 1.0000 | INHALATION_SPRAY | Freq: Two times a day (BID) | RESPIRATORY_TRACT | 3 refills | Status: DC

## 2020-05-31 MED ORDER — FLUTICASONE-SALMETEROL 250-50 MCG/DOSE IN AEPB: 1.0000 | INHALATION_SPRAY | Freq: Two times a day (BID) | RESPIRATORY_TRACT | 11 refills | Status: DC

## 2020-05-31 NOTE — Telephone Encounter (Signed)
Please let the patient know that I sent their prescription to their pharmacy. Thanks, WS 

## 2020-06-04 ENCOUNTER — Other Ambulatory Visit: Payer: Self-pay

## 2020-06-04 ENCOUNTER — Other Ambulatory Visit: Payer: Self-pay | Admitting: Family Medicine

## 2020-06-04 ENCOUNTER — Ambulatory Visit: Payer: Medicare Other

## 2020-06-04 ENCOUNTER — Telehealth: Payer: Self-pay | Admitting: Family Medicine

## 2020-06-04 MED ORDER — FLUTICASONE-SALMETEROL 500-50 MCG/DOSE IN AEPB: 1.0000 | INHALATION_SPRAY | Freq: Two times a day (BID) | RESPIRATORY_TRACT | 11 refills | Status: DC

## 2020-06-04 NOTE — Telephone Encounter (Signed)
Pt was calling to let Dr Darlyn Read that her insurance will not pay for Symbicort but will pay for Advair, wants to know if this can be changed and still sent to [facility]

## 2020-06-04 NOTE — Telephone Encounter (Signed)
Please let the patient know that I sent their prescription to their pharmacy. Thanks, WS 

## 2020-06-05 NOTE — Telephone Encounter (Signed)
Pt aware Advair sent into pharmacy as requested.

## 2020-06-07 DIAGNOSIS — I110 Hypertensive heart disease with heart failure: Secondary | ICD-10-CM | POA: Diagnosis not present

## 2020-06-07 DIAGNOSIS — G894 Chronic pain syndrome: Secondary | ICD-10-CM | POA: Diagnosis not present

## 2020-06-07 DIAGNOSIS — I5032 Chronic diastolic (congestive) heart failure: Secondary | ICD-10-CM | POA: Diagnosis not present

## 2020-06-07 DIAGNOSIS — E1140 Type 2 diabetes mellitus with diabetic neuropathy, unspecified: Secondary | ICD-10-CM | POA: Diagnosis not present

## 2020-06-07 DIAGNOSIS — E1165 Type 2 diabetes mellitus with hyperglycemia: Secondary | ICD-10-CM | POA: Diagnosis not present

## 2020-06-07 DIAGNOSIS — M461 Sacroiliitis, not elsewhere classified: Secondary | ICD-10-CM | POA: Diagnosis not present

## 2020-06-08 DIAGNOSIS — E1165 Type 2 diabetes mellitus with hyperglycemia: Secondary | ICD-10-CM | POA: Diagnosis not present

## 2020-06-08 DIAGNOSIS — G894 Chronic pain syndrome: Secondary | ICD-10-CM | POA: Diagnosis not present

## 2020-06-08 DIAGNOSIS — E1140 Type 2 diabetes mellitus with diabetic neuropathy, unspecified: Secondary | ICD-10-CM | POA: Diagnosis not present

## 2020-06-08 DIAGNOSIS — I110 Hypertensive heart disease with heart failure: Secondary | ICD-10-CM | POA: Diagnosis not present

## 2020-06-08 DIAGNOSIS — I5032 Chronic diastolic (congestive) heart failure: Secondary | ICD-10-CM | POA: Diagnosis not present

## 2020-06-08 DIAGNOSIS — M461 Sacroiliitis, not elsewhere classified: Secondary | ICD-10-CM | POA: Diagnosis not present

## 2020-06-11 ENCOUNTER — Telehealth: Payer: Self-pay | Admitting: *Deleted

## 2020-06-11 MED ORDER — OXYCODONE-ACETAMINOPHEN 5-325 MG PO TABS: 1.0000 | ORAL_TABLET | Freq: Four times a day (QID) | ORAL | 0 refills | Status: DC | PRN

## 2020-06-11 NOTE — Telephone Encounter (Signed)
Mrs Bayless called for a refill on her oxycodone from Dr Dagoberto Ligas.  Last fill by Dr Dagoberto Ligas was 03/26/20 #120 but she has had a fill of #10 since then by Bard Herbert NP.  Her next appt is 07/11/20.

## 2020-06-11 NOTE — Telephone Encounter (Signed)
She got refill from NP Nyoka Cowden because she was in nursing home for rehab-   Also missed her appointment for the same reason-   Will see her again early Feb 2022- will refill her percocet 5/325 mg QID prn- #120

## 2020-06-12 ENCOUNTER — Telehealth: Payer: Self-pay

## 2020-06-12 DIAGNOSIS — G894 Chronic pain syndrome: Secondary | ICD-10-CM | POA: Diagnosis not present

## 2020-06-12 DIAGNOSIS — I110 Hypertensive heart disease with heart failure: Secondary | ICD-10-CM | POA: Diagnosis not present

## 2020-06-12 DIAGNOSIS — E1165 Type 2 diabetes mellitus with hyperglycemia: Secondary | ICD-10-CM | POA: Diagnosis not present

## 2020-06-12 DIAGNOSIS — M461 Sacroiliitis, not elsewhere classified: Secondary | ICD-10-CM | POA: Diagnosis not present

## 2020-06-12 DIAGNOSIS — I5032 Chronic diastolic (congestive) heart failure: Secondary | ICD-10-CM | POA: Diagnosis not present

## 2020-06-12 DIAGNOSIS — E1140 Type 2 diabetes mellitus with diabetic neuropathy, unspecified: Secondary | ICD-10-CM | POA: Diagnosis not present

## 2020-06-12 NOTE — Telephone Encounter (Signed)
VO given to extend PT 

## 2020-06-12 NOTE — Telephone Encounter (Signed)
FYI

## 2020-06-13 ENCOUNTER — Ambulatory Visit (INDEPENDENT_AMBULATORY_CARE_PROVIDER_SITE_OTHER): Payer: Medicare Other | Admitting: Family Medicine

## 2020-06-13 ENCOUNTER — Encounter: Payer: Self-pay | Admitting: Family Medicine

## 2020-06-13 ENCOUNTER — Other Ambulatory Visit: Payer: Self-pay

## 2020-06-13 VITALS — BP 127/78 | HR 58 | Temp 97.4°F | Resp 20 | Ht 62.0 in | Wt 197.2 lb

## 2020-06-13 DIAGNOSIS — M549 Dorsalgia, unspecified: Secondary | ICD-10-CM | POA: Diagnosis not present

## 2020-06-13 DIAGNOSIS — R1084 Generalized abdominal pain: Secondary | ICD-10-CM

## 2020-06-13 DIAGNOSIS — F3112 Bipolar disorder, current episode manic without psychotic features, moderate: Secondary | ICD-10-CM | POA: Diagnosis not present

## 2020-06-13 LAB — URINALYSIS
Bilirubin, UA: NEGATIVE
Glucose, UA: NEGATIVE
Ketones, UA: NEGATIVE
Nitrite, UA: NEGATIVE
Protein,UA: NEGATIVE
RBC, UA: NEGATIVE
Specific Gravity, UA: 1.015 (ref 1.005–1.030)
Urobilinogen, Ur: 0.2 mg/dL (ref 0.2–1.0)
pH, UA: 5 (ref 5.0–7.5)

## 2020-06-13 MED ORDER — ARIPIPRAZOLE 10 MG PO TABS: 30.0000 mg | ORAL_TABLET | Freq: Every day | ORAL | 1 refills | Status: DC

## 2020-06-13 NOTE — Progress Notes (Signed)
Subjective:  Patient ID: Amber Stephenson, female    DOB: 09-13-1945  Age: 75 y.o. MRN: [id number]  CC: Medication Refill   HPI Amber Stephenson presents for recheck of her bipolar condition for which she sees Dr. Casimiro Needle, her psychiatrist usually.  However she has run out of her Abilify over the last few weeks.  She says it calms her and helps her avoid raging and having mood swings.  It agrees with her when used regularly.  She does not have any side effects that she notes.  Patient states that she has had for the last several days a moderately severe pain through the abdomen.  This radiates around to the back as well.  She points to the mid abdomen and the upper lumbar region.  There has been no nausea vomiting or diarrhea with this.  Depression screen [facility] 2/9 06/13/2020 05/29/2020 04/19/2020  Decreased Interest 0 0 0  Down, Depressed, Hopeless 0 0 0  PHQ - 2 Score 0 0 0  Altered sleeping 3 - -  Tired, decreased energy 2 - -  Change in appetite 3 - -  Feeling bad or failure about yourself  0 - -  Trouble concentrating 0 - -  Moving slowly or fidgety/restless 0 - -  Suicidal thoughts 0 - -  PHQ-9 Score 8 - -  Difficult doing work/chores - - -  Some recent data might be hidden    History Amber Stephenson has a past medical history of Atrial fibrillation with RVR (05/19/2017), Bipolar I disorder (01/23/2015), CAD (coronary artery disease), Cardiomegaly (01/12/2018), Chronic anticoagulation (05/30/2015), Chronic back pain, Chronic diastolic CHF (congestive heart failure) (05/30/2015), Diabetes mellitus, type 2, without complication, Diabetic neuropathy (02/06/2016), Dysrhythmia, Essential hypertension, Herpes genitalis in women (07/16/2015), Hyperlipidemia, Hypertension, Hypoxia (02/01/2019), Insomnia (01/23/2015), Lipoma (02/08/2015), Major depressive disorder, Migraine headache with aura (02/12/2016), 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[place]) (04/21/2019), NAFL (nonalcoholic fatty liver) (1/94/1740),  OSA (obstructive sleep apnea) (02/24/2019), Pulmonary hypertension, Renal insufficiency, and RLS (restless legs syndrome) (04/27/2015).   She has a past surgical history that includes THIGH SURGERY; Shoulder surgery (Right); Breast reduction surgery; Eye surgery (Right); Hammer toe surgery; LEFT HEART CATH AND CORONARY ANGIOGRAPHY (N/A, 02/03/2018); and Reverse shoulder arthroplasty (Right, 08/19/2019).   Her family history includes Alcohol abuse in her sister; Alzheimer's disease in her father; Diabetes in her brother, mother, and sister; Heart disease in her brother, mother, and sister; Mental illness in her brother.She reports that she has never smoked. She has never used smokeless tobacco. She reports previous alcohol use. She reports that she does not use drugs.    ROS Review of Systems  Constitutional: Negative.   HENT: Negative.   Eyes: Negative for visual disturbance.  Respiratory: Negative for shortness of breath.   Cardiovascular: Negative for chest pain.  Gastrointestinal: Negative for abdominal pain, diarrhea, nausea and vomiting.  Musculoskeletal: Positive for back pain (She has a band of pain across the abdomen and back.). Negative for arthralgias.    Objective:  BP 127/78   Pulse (!) 58   Temp (!) 97.4 F (36.3 C) (Temporal)   Resp 20   Ht 5' 2"  (1.575 m)   Wt 197 lb 4 oz (89.5 kg)   SpO2 94%   BMI 36.08 kg/m   BP Readings from Last 3 Encounters:  06/13/20 127/78  05/29/20 111/75  05/11/20 105/70    Wt Readings from Last 3 Encounters:  06/13/20 197 lb 4 oz (89.5 kg)  05/29/20 200 lb (90.7 kg)  05/11/20  197 lb 3.2 oz (89.4 kg)     Physical Exam Constitutional:      Appearance: She is well-developed and well-nourished. She is obese.  HENT:     Head: Normocephalic and atraumatic.  Cardiovascular:     Rate and Rhythm: Normal rate and regular rhythm.     Heart sounds: No murmur heard.   Pulmonary:     Effort: Pulmonary effort is normal.     Breath sounds:  Normal breath sounds.  Abdominal:     General: Bowel sounds are normal. There is no distension.     Palpations: Abdomen is soft. There is no mass.     Tenderness: There is abdominal tenderness (Fairly mild). There is no guarding or rebound.  Musculoskeletal:        General: No tenderness or deformity.  Skin:    General: Skin is warm and dry.  Neurological:     Mental Status: She is alert and oriented to person, place, and time.  Psychiatric:        Mood and Affect: Mood and affect normal.        Behavior: Behavior normal.       Assessment & Plan:   Amber Stephenson was seen today for medication refill.  Diagnoses and all orders for this visit:  Generalized abdominal pain -     CBC with Differential/Platelet -     CMP14+EGFR -     Lipase -     Urinalysis  Bipolar affective disorder, currently manic, moderate (HCC)  Bilateral back pain, unspecified back location, unspecified chronicity -     CBC with Differential/Platelet -     CMP14+EGFR -     Lipase  Other orders -     ARIPiprazole (ABILIFY) 10 MG tablet; Take 3 tablets (30 mg total) by mouth daily.       I have discontinued Amber Stephenson. Amber Stephenson's budesonide-formoterol. I have also changed her ARIPiprazole. Additionally, I am having her maintain her Vitamin D3, acetaminophen, multivitamin with minerals, polyethylene glycol, NON FORMULARY, albuterol, albuterol, atorvastatin, diltiazem, apixaban, escitalopram, furosemide, insulin aspart, LORazepam, metoprolol tartrate, potassium chloride SA, pregabalin, rizatriptan, topiramate, icosapent Ethyl, flurazepam, Lantus SoloStar, Fluticasone-Salmeterol, and oxyCODONE-acetaminophen.  Allergies as of 06/13/2020      Reactions   Ivp Dye [iodinated Diagnostic Agents] Anaphylaxis, Swelling   Throat closes   Latex    Latex tape pulls skin with it      Medication List       Accurate as of June 13, 2020  9:03 PM. If you have any questions, ask your nurse or doctor.        STOP  taking these medications   budesonide-formoterol 160-4.5 MCG/ACT inhaler Commonly known as: SYMBICORT Stopped by: [name], MD     TAKE these medications   acetaminophen 325 MG tablet Commonly known as: TYLENOL Take 2 tablets (650 mg total) by mouth every 6 (six) hours as needed for mild pain, fever or headache (or Fever >/= 101).   albuterol (2.5 MG/3ML) 0.083% nebulizer solution Commonly known as: PROVENTIL Take 3 mLs (2.5 mg total) by nebulization every 6 (six) hours as needed for wheezing or shortness of breath.   albuterol 108 (90 Base) MCG/ACT inhaler Commonly known as: VENTOLIN HFA Inhale 2 puffs into the lungs every 6 (six) hours as needed for wheezing or shortness of breath.   apixaban 5 MG Tabs tablet Commonly known as: Eliquis Take 1 tablet (5 mg total) by mouth 2 (two) times daily.   ARIPiprazole 10 MG tablet  Commonly known as: ABILIFY Take 3 tablets (30 mg total) by mouth daily.   atorvastatin 40 MG tablet Commonly known as: LIPITOR Take 1 tablet (40 mg total) by mouth daily.   diltiazem 120 MG 24 hr capsule Commonly known as: CARDIZEM CD Take 1 capsule (120 mg total) by mouth at bedtime.   escitalopram 10 MG tablet Commonly known as: Lexapro Take 1 tablet (10 mg total) by mouth daily.   flurazepam 30 MG capsule Commonly known as: DALMANE Take 30 mg by mouth at bedtime as needed.   Fluticasone-Salmeterol 500-50 MCG/DOSE Aepb Commonly known as: Advair Diskus Inhale 1 puff into the lungs in the morning and at bedtime.   furosemide 40 MG tablet Commonly known as: LASIX TAKE 1 TABLET EVERY MORNING   icosapent Ethyl 1 g capsule Commonly known as: Vascepa TAKE 2 CAPSULES TWICE A DAY WITH MEALS   insulin aspart 100 UNIT/ML injection Commonly known as: novoLOG Inject 10 Units into the skin 3 (three) times daily before meals. Special Instructions: hold for cbg <120   Lantus SoloStar 100 UNIT/ML Solostar Pen Generic drug: insulin glargine Inject 50  Units into the skin 2 (two) times daily.   LORazepam 1 MG tablet Commonly known as: ATIVAN Take 1-2 tablets (1-2 mg total) by mouth See admin instructions. 1 mg QAM and 2 mg QHS   metoprolol tartrate 25 MG tablet Commonly known as: LOPRESSOR Take 1 tablet (25 mg total) by mouth 2 (two) times daily.   multivitamin with minerals Tabs tablet Take 1 tablet by mouth daily.   NON FORMULARY Diet: _____ Regular, ___x___ NAS, ___x____Consistent Carbohydrate, _______NPO _____Other   oxyCODONE-acetaminophen 5-325 MG tablet Commonly known as: PERCOCET/ROXICET Take 1 tablet by mouth every 6 (six) hours as needed for severe pain.   polyethylene glycol 17 g packet Commonly known as: MIRALAX / GLYCOLAX Take 17 g by mouth daily.   potassium chloride SA 20 MEQ tablet Commonly known as: KLOR-CON Take 1 tablet (20 mEq total) by mouth daily.   pregabalin 200 MG capsule Commonly known as: Lyrica Take 1 capsule (200 mg total) by mouth 2 (two) times daily.   rizatriptan 10 MG tablet Commonly known as: MAXALT Take 1 tablet (10 mg total) by mouth daily as needed for migraine. Take one tablet at onset of migraine. Do not take more than 3 tabs max per week.   topiramate 25 MG tablet Commonly known as: TOPAMAX Take 3 tablets (75 mg total) by mouth at bedtime.   Vitamin D3 25 MCG (1000 UT) Caps Take 1,000 Units by mouth daily.        Follow-up: No follow-ups on file.  [name], M.D.

## 2020-06-14 DIAGNOSIS — I110 Hypertensive heart disease with heart failure: Secondary | ICD-10-CM | POA: Diagnosis not present

## 2020-06-14 DIAGNOSIS — M461 Sacroiliitis, not elsewhere classified: Secondary | ICD-10-CM | POA: Diagnosis not present

## 2020-06-14 DIAGNOSIS — I5032 Chronic diastolic (congestive) heart failure: Secondary | ICD-10-CM | POA: Diagnosis not present

## 2020-06-14 DIAGNOSIS — G894 Chronic pain syndrome: Secondary | ICD-10-CM | POA: Diagnosis not present

## 2020-06-14 DIAGNOSIS — E1165 Type 2 diabetes mellitus with hyperglycemia: Secondary | ICD-10-CM | POA: Diagnosis not present

## 2020-06-14 DIAGNOSIS — E1140 Type 2 diabetes mellitus with diabetic neuropathy, unspecified: Secondary | ICD-10-CM | POA: Diagnosis not present

## 2020-06-14 LAB — CBC WITH DIFFERENTIAL/PLATELET
Basophils Absolute: 0.1 10*3/uL (ref 0.0–0.2)
Basos: 1 %
EOS (ABSOLUTE): 0.1 10*3/uL (ref 0.0–0.4)
Eos: 1 %
Hematocrit: 41.2 % (ref 34.0–46.6)
Hemoglobin: 13.7 g/dL (ref 11.1–15.9)
Immature Grans (Abs): 0.1 10*3/uL (ref 0.0–0.1)
Immature Granulocytes: 1 %
Lymphocytes Absolute: 2.8 10*3/uL (ref 0.7–3.1)
Lymphs: 29 %
MCH: 29.8 pg (ref 26.6–33.0)
MCHC: 33.3 g/dL (ref 31.5–35.7)
MCV: 90 fL (ref 79–97)
Monocytes Absolute: 0.6 10*3/uL (ref 0.1–0.9)
Monocytes: 6 %
Neutrophils Absolute: 5.8 10*3/uL (ref 1.4–7.0)
Neutrophils: 62 %
Platelets: 203 10*3/uL (ref 150–450)
RBC: 4.59 x10E6/uL (ref 3.77–5.28)
RDW: 12.9 % (ref 11.7–15.4)
WBC: 9.5 10*3/uL (ref 3.4–10.8)

## 2020-06-14 LAB — CMP14+EGFR
ALT: 13 IU/L (ref 0–32)
AST: 18 IU/L (ref 0–40)
Albumin/Globulin Ratio: 1.5 (ref 1.2–2.2)
Albumin: 4.3 g/dL (ref 3.7–4.7)
Alkaline Phosphatase: 148 IU/L — ABNORMAL HIGH (ref 44–121)
BUN/Creatinine Ratio: 24 (ref 12–28)
BUN: 22 mg/dL (ref 8–27)
Bilirubin Total: 0.3 mg/dL (ref 0.0–1.2)
CO2: 26 mmol/L (ref 20–29)
Calcium: 9.3 mg/dL (ref 8.7–10.3)
Chloride: 103 mmol/L (ref 96–106)
Creatinine, Ser: 0.92 mg/dL (ref 0.57–1.00)
GFR calc Af Amer: 71 mL/min/{1.73_m2} (ref >59)
GFR calc non Af Amer: 62 mL/min/{1.73_m2} (ref >59)
Globulin, Total: 2.8 g/dL (ref 1.5–4.5)
Glucose: 214 mg/dL — ABNORMAL HIGH (ref 65–99)
Potassium: 4.3 mmol/L (ref 3.5–5.2)
Sodium: 142 mmol/L (ref 134–144)
Total Protein: 7.1 g/dL (ref 6.0–8.5)

## 2020-06-14 LAB — LIPASE: Lipase: 47 U/L (ref 14–85)

## 2020-06-15 DIAGNOSIS — Z7984 Long term (current) use of oral hypoglycemic drugs: Secondary | ICD-10-CM | POA: Diagnosis not present

## 2020-06-15 DIAGNOSIS — E1140 Type 2 diabetes mellitus with diabetic neuropathy, unspecified: Secondary | ICD-10-CM | POA: Diagnosis not present

## 2020-06-15 DIAGNOSIS — I5032 Chronic diastolic (congestive) heart failure: Secondary | ICD-10-CM | POA: Diagnosis not present

## 2020-06-15 DIAGNOSIS — R296 Repeated falls: Secondary | ICD-10-CM | POA: Diagnosis not present

## 2020-06-15 DIAGNOSIS — I2510 Atherosclerotic heart disease of native coronary artery without angina pectoris: Secondary | ICD-10-CM | POA: Diagnosis not present

## 2020-06-15 DIAGNOSIS — F319 Bipolar disorder, unspecified: Secondary | ICD-10-CM | POA: Diagnosis not present

## 2020-06-15 DIAGNOSIS — I482 Chronic atrial fibrillation, unspecified: Secondary | ICD-10-CM | POA: Diagnosis not present

## 2020-06-15 DIAGNOSIS — I110 Hypertensive heart disease with heart failure: Secondary | ICD-10-CM | POA: Diagnosis not present

## 2020-06-15 DIAGNOSIS — M797 Fibromyalgia: Secondary | ICD-10-CM | POA: Diagnosis not present

## 2020-06-15 DIAGNOSIS — Z7901 Long term (current) use of anticoagulants: Secondary | ICD-10-CM | POA: Diagnosis not present

## 2020-06-15 DIAGNOSIS — J449 Chronic obstructive pulmonary disease, unspecified: Secondary | ICD-10-CM | POA: Diagnosis not present

## 2020-06-15 DIAGNOSIS — G894 Chronic pain syndrome: Secondary | ICD-10-CM | POA: Diagnosis not present

## 2020-06-15 DIAGNOSIS — M461 Sacroiliitis, not elsewhere classified: Secondary | ICD-10-CM | POA: Diagnosis not present

## 2020-06-15 DIAGNOSIS — R262 Difficulty in walking, not elsewhere classified: Secondary | ICD-10-CM | POA: Diagnosis not present

## 2020-06-15 DIAGNOSIS — E1165 Type 2 diabetes mellitus with hyperglycemia: Secondary | ICD-10-CM | POA: Diagnosis not present

## 2020-06-15 DIAGNOSIS — M6281 Muscle weakness (generalized): Secondary | ICD-10-CM | POA: Diagnosis not present

## 2020-06-27 DIAGNOSIS — I5032 Chronic diastolic (congestive) heart failure: Secondary | ICD-10-CM | POA: Diagnosis not present

## 2020-06-27 DIAGNOSIS — I110 Hypertensive heart disease with heart failure: Secondary | ICD-10-CM | POA: Diagnosis not present

## 2020-06-27 DIAGNOSIS — E1140 Type 2 diabetes mellitus with diabetic neuropathy, unspecified: Secondary | ICD-10-CM | POA: Diagnosis not present

## 2020-06-27 DIAGNOSIS — E1165 Type 2 diabetes mellitus with hyperglycemia: Secondary | ICD-10-CM | POA: Diagnosis not present

## 2020-06-27 DIAGNOSIS — M461 Sacroiliitis, not elsewhere classified: Secondary | ICD-10-CM | POA: Diagnosis not present

## 2020-06-27 DIAGNOSIS — G894 Chronic pain syndrome: Secondary | ICD-10-CM | POA: Diagnosis not present

## 2020-06-28 ENCOUNTER — Encounter: Payer: Self-pay | Admitting: Family Medicine

## 2020-06-28 ENCOUNTER — Ambulatory Visit (INDEPENDENT_AMBULATORY_CARE_PROVIDER_SITE_OTHER): Payer: Medicare Other | Admitting: Family Medicine

## 2020-06-28 DIAGNOSIS — E1142 Type 2 diabetes mellitus with diabetic polyneuropathy: Secondary | ICD-10-CM | POA: Diagnosis not present

## 2020-06-28 DIAGNOSIS — B372 Candidiasis of skin and nail: Secondary | ICD-10-CM | POA: Diagnosis not present

## 2020-06-28 MED ORDER — KETOCONAZOLE 2 % EX CREA: TOPICAL_CREAM | CUTANEOUS | 1 refills | Status: DC

## 2020-06-28 NOTE — Progress Notes (Signed)
Subjective:    Patient ID: Amber Stephenson, female    DOB: June 01, 1946, 75 y.o.   MRN: [id number]   HPI: Amber Stephenson is a 75 y.o. female presenting for ketoconazole refill, 2% uses it for rash in groin. Tends to get it when her sugar goes up. Eating better now to get rid of it.  Patient also concerned about the alkaline phosphatase that was elevated.  We reviewed that she understands the need to monitor..  As long as no other liver enzymes are elevated I think we ca give it some time since the elevation is very minimal.     Depression screen [facility] 2/9 06/13/2020 05/29/2020 04/19/2020 04/02/2020 03/26/2020  Decreased Interest 0 0 0 0 1  Down, Depressed, Hopeless 0 0 0 0 1  PHQ - 2 Score 0 0 0 0 2  Altered sleeping 3 - - - -  Tired, decreased energy 2 - - - -  Change in appetite 3 - - - -  Feeling bad or failure about yourself  0 - - - -  Trouble concentrating 0 - - - -  Moving slowly or fidgety/restless 0 - - - -  Suicidal thoughts 0 - - - -  PHQ-9 Score 8 - - - -  Difficult doing work/chores - - - - -  Some recent data might be hidden     Relevant past medical, surgical, family and social history reviewed and updated as indicated.  Interim medical history since our last visit reviewed. Allergies and medications reviewed and updated.  ROS:  Review of Systems  Noncontributory, except as noted in HPI yeast dermatitis Social History   Tobacco Use  Smoking Status Never Smoker  Smokeless Tobacco Never Used       Objective:     Wt Readings from Last 3 Encounters:  06/13/20 197 lb 4 oz (89.5 kg)  05/29/20 200 lb (90.7 kg)  05/11/20 197 lb 3.2 oz (89.4 kg)     Exam deferred. Pt. Harboring due to COVID 19. Phone visit performed.   Assessment & Plan:   1. Yeast dermatitis   2. Diabetic polyneuropathy associated with type 2 diabetes mellitus ([place])     Meds ordered this encounter  Medications  . ketoconazole (NIZORAL) 2 % cream    Sig: Apply to affected areas  in groin region ONE time daily for 3-4 weeks.    Dispense:  60 g    Refill:  1    No orders of the defined types were placed in this encounter.     Diagnoses and all orders for this visit:  Yeast dermatitis  Diabetic polyneuropathy associated with type 2 diabetes mellitus ([place])  Other orders -     ketoconazole (NIZORAL) 2 % cream; Apply to affected areas in groin region ONE time daily for 3-4 weeks.    Virtual Visit via telephone Note  I discussed the limitations, risks, security and privacy concerns of performing an evaluation and management service by telephone and the availability of in person appointments. The patient was identified with two identifiers. Pt.expressed understanding and agreed to proceed. Pt. Is at home. Dr. Livia Snellen is in his office.  Follow Up Instructions:   I discussed the assessment and treatment plan with the patient. The patient was provided an opportunity to ask questions and all were answered. The patient agreed with the plan and demonstrated an understanding of the instructions.   The patient was advised to call back or seek an in-person evaluation  if the symptoms worsen or if the condition fails to improve as anticipated.   Total minutes including chart review and phone contact time: 14   Follow up plan: Return if symptoms worsen or fail to improve.  [name], MD [place]

## 2020-06-29 ENCOUNTER — Other Ambulatory Visit: Payer: Self-pay | Admitting: Adult Health

## 2020-06-29 DIAGNOSIS — E1122 Type 2 diabetes mellitus with diabetic chronic kidney disease: Secondary | ICD-10-CM | POA: Diagnosis not present

## 2020-06-29 DIAGNOSIS — R809 Proteinuria, unspecified: Secondary | ICD-10-CM | POA: Diagnosis not present

## 2020-06-29 DIAGNOSIS — E870 Hyperosmolality and hypernatremia: Secondary | ICD-10-CM | POA: Diagnosis not present

## 2020-06-29 DIAGNOSIS — I129 Hypertensive chronic kidney disease with stage 1 through stage 4 chronic kidney disease, or unspecified chronic kidney disease: Secondary | ICD-10-CM | POA: Diagnosis not present

## 2020-06-29 DIAGNOSIS — E1129 Type 2 diabetes mellitus with other diabetic kidney complication: Secondary | ICD-10-CM | POA: Diagnosis not present

## 2020-06-29 DIAGNOSIS — N189 Chronic kidney disease, unspecified: Secondary | ICD-10-CM | POA: Diagnosis not present

## 2020-07-04 DIAGNOSIS — E1140 Type 2 diabetes mellitus with diabetic neuropathy, unspecified: Secondary | ICD-10-CM | POA: Diagnosis not present

## 2020-07-04 DIAGNOSIS — E1165 Type 2 diabetes mellitus with hyperglycemia: Secondary | ICD-10-CM | POA: Diagnosis not present

## 2020-07-04 DIAGNOSIS — I110 Hypertensive heart disease with heart failure: Secondary | ICD-10-CM | POA: Diagnosis not present

## 2020-07-04 DIAGNOSIS — I5032 Chronic diastolic (congestive) heart failure: Secondary | ICD-10-CM | POA: Diagnosis not present

## 2020-07-04 DIAGNOSIS — G894 Chronic pain syndrome: Secondary | ICD-10-CM | POA: Diagnosis not present

## 2020-07-04 DIAGNOSIS — M461 Sacroiliitis, not elsewhere classified: Secondary | ICD-10-CM | POA: Diagnosis not present

## 2020-07-05 DIAGNOSIS — M79676 Pain in unspecified toe(s): Secondary | ICD-10-CM | POA: Diagnosis not present

## 2020-07-05 DIAGNOSIS — B351 Tinea unguium: Secondary | ICD-10-CM | POA: Diagnosis not present

## 2020-07-05 DIAGNOSIS — E1142 Type 2 diabetes mellitus with diabetic polyneuropathy: Secondary | ICD-10-CM | POA: Diagnosis not present

## 2020-07-05 DIAGNOSIS — L84 Corns and callosities: Secondary | ICD-10-CM | POA: Diagnosis not present

## 2020-07-11 ENCOUNTER — Encounter
Payer: Medicare Other | Attending: Physical Medicine and Rehabilitation | Admitting: Physical Medicine and Rehabilitation

## 2020-07-11 ENCOUNTER — Encounter: Payer: Self-pay | Admitting: Physical Medicine and Rehabilitation

## 2020-07-11 ENCOUNTER — Other Ambulatory Visit: Payer: Self-pay

## 2020-07-11 VITALS — BP 146/87 | HR 62 | Temp 97.8°F | Ht 62.0 in | Wt 197.0 lb

## 2020-07-11 DIAGNOSIS — M797 Fibromyalgia: Secondary | ICD-10-CM | POA: Insufficient documentation

## 2020-07-11 DIAGNOSIS — Z79891 Long term (current) use of opiate analgesic: Secondary | ICD-10-CM

## 2020-07-11 DIAGNOSIS — M5442 Lumbago with sciatica, left side: Secondary | ICD-10-CM | POA: Insufficient documentation

## 2020-07-11 DIAGNOSIS — G8929 Other chronic pain: Secondary | ICD-10-CM

## 2020-07-11 DIAGNOSIS — Z5181 Encounter for therapeutic drug level monitoring: Secondary | ICD-10-CM

## 2020-07-11 DIAGNOSIS — R42 Dizziness and giddiness: Secondary | ICD-10-CM

## 2020-07-11 DIAGNOSIS — M5441 Lumbago with sciatica, right side: Secondary | ICD-10-CM | POA: Diagnosis not present

## 2020-07-11 DIAGNOSIS — G894 Chronic pain syndrome: Secondary | ICD-10-CM | POA: Diagnosis not present

## 2020-07-11 MED ORDER — HYDROMORPHONE HCL 2 MG PO TABS
2.0000 mg | ORAL_TABLET | Freq: Four times a day (QID) | ORAL | 0 refills | Status: DC | PRN
Start: 2020-07-11 — End: 2020-07-27

## 2020-07-11 NOTE — Progress Notes (Signed)
Subjective:    Patient ID: Amber Stephenson, female    DOB: February 24, 1946, 75 y.o.   MRN: [id number]  HPI  Patient is a 75 yr old female with hx of  CAD,pulmonary HTN-and DM- with CKD- Stage III here for right low back pain with sciaticaAs well as upper back/neck pain/myofascial pain here forf/u.  Doesn't want injections  Was using Theracane - is helpful somewhat.   Has been on Percocet 5/325 mg - 10 years.  Only lasts for awhile- maybe 2 hours at a time.    Goes to bathroom- every other day- firm- no pushing.  Doesn't have constipation- takes no meds for bowel.   1 morning couldn't walk, lost balance- needed assistance.  Kept falling 10x- scabs and bruises all over.  Didn't figure out what it was.  Did rehab at [facility]- x 3-4 days and then 3 weeks at [name]- subacute rehab-- - now in home therapy- got home 3 days before Christmas-  Doesn't think will need outpt PT after d/c from H/H- balance is still some of an issue,   Had no near falls or fall since d/c home- some days balance is not as good- uses furniture to walk- dresses facing the bed just in case.    No other issues.  A lot more alert this year, per husband 2022. She said she's pretty shy.      Pain Inventory Average Pain 6 Pain Right Now 6 My pain is aching  In the last 24 hours, has pain interfered with the following? General activity 7 Relation with others 2 Enjoyment of life 4 What TIME of day is your pain at its worst? morning  Sleep (in general) Good  Pain is worse with: bending and inactivity Pain improves with: heat/ice, therapy/exercise, medication and TENS Relief from Meds: 6  Family History  Problem Relation Age of Onset  . Diabetes Mother   . Heart disease Mother   . Heart disease Brother   . Heart disease Sister        CABG  . Diabetes Sister   . Alzheimer's disease Father   . Alcohol abuse Sister   . Mental illness Brother   . Diabetes Brother    Social History    Socioeconomic History  . Marital status: Married    Spouse name: Orpah Greek  . Number of children: 3  . Years of education: 89  . Highest education level: Bachelor's degree (e.g., BA, AB, BS)  Occupational History  . Occupation: Retired    Comment: marketing  Tobacco Use  . Smoking status: Never Smoker  . Smokeless tobacco: Never Used  Vaping Use  . Vaping Use: Never used  Substance and Sexual Activity  . Alcohol use: Not Currently    Alcohol/week: 0.0 standard drinks  . Drug use: No  . Sexual activity: Yes    Partners: Male    Birth control/protection: Post-menopausal  Other Topics Concern  . Not on file  Social History Narrative   Lives at home with husband.       Right handed   Social Determinants of Health   Financial Resource Strain: Not on file  Food Insecurity: Not on file  Transportation Needs: Not on file  Physical Activity: Not on file  Stress: Not on file  Social Connections: Not on file   Past Surgical History:  Procedure Laterality Date  . BREAST REDUCTION SURGERY    . EYE SURGERY Right    cateracts  . HAMMER TOE SURGERY    .  LEFT HEART CATH AND CORONARY ANGIOGRAPHY N/A 02/03/2018   Procedure: LEFT HEART CATH AND CORONARY ANGIOGRAPHY;  Surgeon: Martinique, Peter M, MD;  Location: [facility];  Service: Cardiovascular;  Laterality: N/A;  . REVERSE SHOULDER ARTHROPLASTY Right 08/19/2019   Procedure: REVERSE SHOULDER ARTHROPLASTY;  Surgeon: Netta Cedars, MD;  Location: [facility];  Service: Orthopedics;  Laterality: Right;  interscalene block  . SHOULDER SURGERY Right    "I BROKE MY SHOUDLER  . THIGH SURGERY     "TO REMOVE A TUMOR "   Past Surgical History:  Procedure Laterality Date  . BREAST REDUCTION SURGERY    . EYE SURGERY Right    cateracts  . HAMMER TOE SURGERY    . LEFT HEART CATH AND CORONARY ANGIOGRAPHY N/A 02/03/2018   Procedure: LEFT HEART CATH AND CORONARY ANGIOGRAPHY;  Surgeon: Martinique, Peter M, MD;  Location: [facility];  Service:  Cardiovascular;  Laterality: N/A;  . REVERSE SHOULDER ARTHROPLASTY Right 08/19/2019   Procedure: REVERSE SHOULDER ARTHROPLASTY;  Surgeon: Netta Cedars, MD;  Location: [facility];  Service: Orthopedics;  Laterality: Right;  interscalene block  . SHOULDER SURGERY Right    "I BROKE MY SHOUDLER  . THIGH SURGERY     "TO REMOVE A TUMOR "   Past Medical History:  Diagnosis Date  . Acute on chronic respiratory failure with hypoxia ([place]) 01/22/2019  . Atrial fibrillation with RVR 05/19/2017  . Bipolar I disorder 01/23/2015  . CAD (coronary artery disease)    Nonobstructive; Managed by Dr. Bronson Ing  . Cardiomegaly 01/12/2018  . Chronic anticoagulation 05/30/2015  . Chronic back pain    Lower back  . Chronic diastolic CHF (congestive heart failure) 05/30/2015  . Diabetes mellitus, type 2, without complication   . Diabetic neuropathy 02/06/2016  . Dysrhythmia    A-Fib  . Essential hypertension   . Herpes genitalis in women 07/16/2015  . Hyperlipidemia   . Hypertension   . Hypoxia 02/01/2019  . Insomnia 01/23/2015  . Lipoma 02/08/2015  . Major depressive disorder   . Migraine headache with aura 02/12/2016  . 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[place]) 04/21/2019  . NAFL (nonalcoholic fatty liver) 11/06/3708  . OSA (obstructive sleep apnea) 02/24/2019   10/09/2018 - HST  - AHI 40.6   . Pulmonary hypertension   . Renal insufficiency    Managed by Dr. Wallace Keller  . RLS (restless legs syndrome) 04/27/2015   BP (!) 146/87   Pulse 62   Temp 97.8 F (36.6 C)   Ht 5\' 2"  (1.575 m)   Wt 197 lb (89.4 kg)   SpO2 93%   BMI 36.03 kg/m   Opioid Risk Score:   Fall Risk Score:  `1  Depression screen PHQ 2/9  Depression screen [facility] 2/9 06/13/2020 05/29/2020 04/19/2020 04/02/2020 03/26/2020 02/10/2020 12/23/2019  Decreased Interest 0 0 0 0 1 0 0  Down, Depressed, Hopeless 0 0 0 0 1 0 0  PHQ - 2 Score 0 0 0 0 2 0 0  Altered sleeping 3 - - - - - -  Tired, decreased energy 2 - - - - - -  Change in appetite 3 - -  - - - -  Feeling bad or failure about yourself  0 - - - - - -  Trouble concentrating 0 - - - - - -  Moving slowly or fidgety/restless 0 - - - - - -  Suicidal thoughts 0 - - - - - -  PHQ-9 Score 8 - - - - - -  Difficult doing work/chores - - - - - - -  Some recent data might be hidden    Review of Systems  Musculoskeletal: Positive for back pain.  All other systems reviewed and are negative.      Objective:   Physical Exam Awake, alert, appropriate, accompanied by husband, NAD TTP in low band across back.  Difficulty getting to feet from seated position since low.       Assessment & Plan:    Patient is a 75 yr old female with hx of  CAD,pulmonary HTN-and DM- with CKD- Stage III here for right low back pain with sciaticaAs well as upper back/neck pain/myofascial pain here forf/u.   1. Will try Changing to  Dilaudid 2 mg 4x/day for pain meds.  Insurance might require that new Rx is a 7 days Rx- if so, just let me know- and I will send in 30 days Rx after that.    2. If doesn't work the way we hoped (getting used to meds over time- been 10 years since started). Will then increase to 7.5 mg Percocet if doesn't work.   3. F/U in 2 months.   I spent a total of 20 minutes on visit- as detailed above.

## 2020-07-11 NOTE — Patient Instructions (Signed)
  Patient is a 75 yr old female with hx of  CAD,pulmonary HTN-and DM- with CKD- Stage III here for right low back pain with sciaticaAs well as upper back/neck pain/myofascial pain here forf/u.   1. Will try Changing to  Dilaudid 2 mg 4x/day for pain meds.  Insurance might require that new Rx is a 7 days Rx- if so, just let me know- and I will send in 30 days Rx after that.    2. If doesn't work the way we hoped (getting used to meds over time- been 10 years since started). Will then increase to 7.5 mg Percocet if doesn't work.   3. F/U in 2 months.

## 2020-07-12 DIAGNOSIS — M461 Sacroiliitis, not elsewhere classified: Secondary | ICD-10-CM | POA: Diagnosis not present

## 2020-07-12 DIAGNOSIS — I5032 Chronic diastolic (congestive) heart failure: Secondary | ICD-10-CM | POA: Diagnosis not present

## 2020-07-12 DIAGNOSIS — G894 Chronic pain syndrome: Secondary | ICD-10-CM | POA: Diagnosis not present

## 2020-07-12 DIAGNOSIS — I110 Hypertensive heart disease with heart failure: Secondary | ICD-10-CM | POA: Diagnosis not present

## 2020-07-12 DIAGNOSIS — E1140 Type 2 diabetes mellitus with diabetic neuropathy, unspecified: Secondary | ICD-10-CM | POA: Diagnosis not present

## 2020-07-12 DIAGNOSIS — E1165 Type 2 diabetes mellitus with hyperglycemia: Secondary | ICD-10-CM | POA: Diagnosis not present

## 2020-07-13 DIAGNOSIS — H5213 Myopia, bilateral: Secondary | ICD-10-CM | POA: Diagnosis not present

## 2020-07-13 DIAGNOSIS — H40013 Open angle with borderline findings, low risk, bilateral: Secondary | ICD-10-CM | POA: Diagnosis not present

## 2020-07-13 DIAGNOSIS — E119 Type 2 diabetes mellitus without complications: Secondary | ICD-10-CM | POA: Diagnosis not present

## 2020-07-13 DIAGNOSIS — Z961 Presence of intraocular lens: Secondary | ICD-10-CM | POA: Diagnosis not present

## 2020-07-15 DIAGNOSIS — F319 Bipolar disorder, unspecified: Secondary | ICD-10-CM | POA: Diagnosis not present

## 2020-07-15 DIAGNOSIS — M797 Fibromyalgia: Secondary | ICD-10-CM | POA: Diagnosis not present

## 2020-07-15 DIAGNOSIS — J449 Chronic obstructive pulmonary disease, unspecified: Secondary | ICD-10-CM | POA: Diagnosis not present

## 2020-07-15 DIAGNOSIS — M6281 Muscle weakness (generalized): Secondary | ICD-10-CM | POA: Diagnosis not present

## 2020-07-15 DIAGNOSIS — R296 Repeated falls: Secondary | ICD-10-CM | POA: Diagnosis not present

## 2020-07-15 DIAGNOSIS — I110 Hypertensive heart disease with heart failure: Secondary | ICD-10-CM | POA: Diagnosis not present

## 2020-07-15 DIAGNOSIS — M461 Sacroiliitis, not elsewhere classified: Secondary | ICD-10-CM | POA: Diagnosis not present

## 2020-07-15 DIAGNOSIS — Z7901 Long term (current) use of anticoagulants: Secondary | ICD-10-CM | POA: Diagnosis not present

## 2020-07-15 DIAGNOSIS — R262 Difficulty in walking, not elsewhere classified: Secondary | ICD-10-CM | POA: Diagnosis not present

## 2020-07-15 DIAGNOSIS — I2510 Atherosclerotic heart disease of native coronary artery without angina pectoris: Secondary | ICD-10-CM | POA: Diagnosis not present

## 2020-07-15 DIAGNOSIS — Z7984 Long term (current) use of oral hypoglycemic drugs: Secondary | ICD-10-CM | POA: Diagnosis not present

## 2020-07-15 DIAGNOSIS — E1165 Type 2 diabetes mellitus with hyperglycemia: Secondary | ICD-10-CM | POA: Diagnosis not present

## 2020-07-15 DIAGNOSIS — E1140 Type 2 diabetes mellitus with diabetic neuropathy, unspecified: Secondary | ICD-10-CM | POA: Diagnosis not present

## 2020-07-15 DIAGNOSIS — I482 Chronic atrial fibrillation, unspecified: Secondary | ICD-10-CM | POA: Diagnosis not present

## 2020-07-15 DIAGNOSIS — I5032 Chronic diastolic (congestive) heart failure: Secondary | ICD-10-CM | POA: Diagnosis not present

## 2020-07-15 DIAGNOSIS — G894 Chronic pain syndrome: Secondary | ICD-10-CM | POA: Diagnosis not present

## 2020-07-17 DIAGNOSIS — I110 Hypertensive heart disease with heart failure: Secondary | ICD-10-CM | POA: Diagnosis not present

## 2020-07-17 DIAGNOSIS — I5032 Chronic diastolic (congestive) heart failure: Secondary | ICD-10-CM | POA: Diagnosis not present

## 2020-07-17 DIAGNOSIS — E1165 Type 2 diabetes mellitus with hyperglycemia: Secondary | ICD-10-CM | POA: Diagnosis not present

## 2020-07-17 DIAGNOSIS — E1140 Type 2 diabetes mellitus with diabetic neuropathy, unspecified: Secondary | ICD-10-CM | POA: Diagnosis not present

## 2020-07-17 DIAGNOSIS — M461 Sacroiliitis, not elsewhere classified: Secondary | ICD-10-CM | POA: Diagnosis not present

## 2020-07-17 DIAGNOSIS — G894 Chronic pain syndrome: Secondary | ICD-10-CM | POA: Diagnosis not present

## 2020-07-18 ENCOUNTER — Other Ambulatory Visit: Payer: Self-pay | Admitting: Family

## 2020-07-18 ENCOUNTER — Other Ambulatory Visit: Payer: Self-pay | Admitting: Adult Health

## 2020-07-18 DIAGNOSIS — E782 Mixed hyperlipidemia: Secondary | ICD-10-CM

## 2020-07-18 DIAGNOSIS — I5032 Chronic diastolic (congestive) heart failure: Secondary | ICD-10-CM

## 2020-07-18 DIAGNOSIS — F3130 Bipolar disorder, current episode depressed, mild or moderate severity, unspecified: Secondary | ICD-10-CM

## 2020-07-18 DIAGNOSIS — I4821 Permanent atrial fibrillation: Secondary | ICD-10-CM

## 2020-07-19 ENCOUNTER — Other Ambulatory Visit: Payer: Self-pay | Admitting: *Deleted

## 2020-07-19 DIAGNOSIS — G894 Chronic pain syndrome: Secondary | ICD-10-CM | POA: Diagnosis not present

## 2020-07-19 DIAGNOSIS — I5032 Chronic diastolic (congestive) heart failure: Secondary | ICD-10-CM | POA: Diagnosis not present

## 2020-07-19 DIAGNOSIS — E1165 Type 2 diabetes mellitus with hyperglycemia: Secondary | ICD-10-CM | POA: Diagnosis not present

## 2020-07-19 DIAGNOSIS — M461 Sacroiliitis, not elsewhere classified: Secondary | ICD-10-CM | POA: Diagnosis not present

## 2020-07-19 DIAGNOSIS — I110 Hypertensive heart disease with heart failure: Secondary | ICD-10-CM | POA: Diagnosis not present

## 2020-07-19 DIAGNOSIS — E1140 Type 2 diabetes mellitus with diabetic neuropathy, unspecified: Secondary | ICD-10-CM | POA: Diagnosis not present

## 2020-07-19 LAB — TOXASSURE SELECT,+ANTIDEPR,UR

## 2020-07-19 MED ORDER — METOPROLOL TARTRATE 25 MG PO TABS: 25.0000 mg | ORAL_TABLET | Freq: Two times a day (BID) | ORAL | 3 refills | Status: DC

## 2020-07-21 ENCOUNTER — Other Ambulatory Visit (HOSPITAL_COMMUNITY): Payer: Self-pay | Admitting: Psychiatry

## 2020-07-21 DIAGNOSIS — F3130 Bipolar disorder, current episode depressed, mild or moderate severity, unspecified: Secondary | ICD-10-CM

## 2020-07-23 DIAGNOSIS — N189 Chronic kidney disease, unspecified: Secondary | ICD-10-CM | POA: Diagnosis not present

## 2020-07-23 DIAGNOSIS — E1129 Type 2 diabetes mellitus with other diabetic kidney complication: Secondary | ICD-10-CM | POA: Diagnosis not present

## 2020-07-23 DIAGNOSIS — E1122 Type 2 diabetes mellitus with diabetic chronic kidney disease: Secondary | ICD-10-CM | POA: Diagnosis not present

## 2020-07-23 DIAGNOSIS — E870 Hyperosmolality and hypernatremia: Secondary | ICD-10-CM | POA: Diagnosis not present

## 2020-07-23 DIAGNOSIS — I129 Hypertensive chronic kidney disease with stage 1 through stage 4 chronic kidney disease, or unspecified chronic kidney disease: Secondary | ICD-10-CM | POA: Diagnosis not present

## 2020-07-23 DIAGNOSIS — R809 Proteinuria, unspecified: Secondary | ICD-10-CM | POA: Diagnosis not present

## 2020-07-23 DIAGNOSIS — Z7189 Other specified counseling: Secondary | ICD-10-CM | POA: Diagnosis not present

## 2020-07-24 ENCOUNTER — Telehealth: Payer: Self-pay | Admitting: *Deleted

## 2020-07-24 NOTE — Telephone Encounter (Signed)
Urine drug screen is appropriately positive for oxycodone and lorazepam which are prescribed, but it is also positive for alcohol.

## 2020-07-25 ENCOUNTER — Emergency Department (HOSPITAL_COMMUNITY): Payer: Medicare Other

## 2020-07-25 ENCOUNTER — Emergency Department (HOSPITAL_COMMUNITY)
Admission: EM | Admit: 2020-07-25 | Discharge: 2020-07-26 | Disposition: A | Discharge status: Home / Self Care | Payer: Medicare Other | Attending: Emergency Medicine | Admitting: Emergency Medicine

## 2020-07-25 ENCOUNTER — Other Ambulatory Visit: Payer: Self-pay

## 2020-07-25 ENCOUNTER — Encounter (HOSPITAL_COMMUNITY): Payer: Self-pay | Admitting: Emergency Medicine

## 2020-07-25 DIAGNOSIS — R0902 Hypoxemia: Secondary | ICD-10-CM | POA: Diagnosis not present

## 2020-07-25 DIAGNOSIS — Z91040 Latex allergy status: Secondary | ICD-10-CM | POA: Diagnosis not present

## 2020-07-25 DIAGNOSIS — Z20822 Contact with and (suspected) exposure to covid-19: Secondary | ICD-10-CM | POA: Diagnosis not present

## 2020-07-25 DIAGNOSIS — E1165 Type 2 diabetes mellitus with hyperglycemia: Secondary | ICD-10-CM | POA: Diagnosis not present

## 2020-07-25 DIAGNOSIS — E1140 Type 2 diabetes mellitus with diabetic neuropathy, unspecified: Secondary | ICD-10-CM | POA: Diagnosis not present

## 2020-07-25 DIAGNOSIS — Z79899 Other long term (current) drug therapy: Secondary | ICD-10-CM | POA: Insufficient documentation

## 2020-07-25 DIAGNOSIS — M549 Dorsalgia, unspecified: Secondary | ICD-10-CM | POA: Diagnosis not present

## 2020-07-25 DIAGNOSIS — I2510 Atherosclerotic heart disease of native coronary artery without angina pectoris: Secondary | ICD-10-CM | POA: Insufficient documentation

## 2020-07-25 DIAGNOSIS — M5441 Lumbago with sciatica, right side: Secondary | ICD-10-CM | POA: Diagnosis not present

## 2020-07-25 DIAGNOSIS — M545 Low back pain, unspecified: Secondary | ICD-10-CM | POA: Diagnosis not present

## 2020-07-25 DIAGNOSIS — Z7901 Long term (current) use of anticoagulants: Secondary | ICD-10-CM | POA: Insufficient documentation

## 2020-07-25 DIAGNOSIS — R000 Tachycardia, unspecified: Secondary | ICD-10-CM | POA: Insufficient documentation

## 2020-07-25 DIAGNOSIS — Z96611 Presence of right artificial shoulder joint: Secondary | ICD-10-CM | POA: Diagnosis not present

## 2020-07-25 DIAGNOSIS — I110 Hypertensive heart disease with heart failure: Secondary | ICD-10-CM | POA: Diagnosis not present

## 2020-07-25 DIAGNOSIS — M6281 Muscle weakness (generalized): Secondary | ICD-10-CM | POA: Insufficient documentation

## 2020-07-25 DIAGNOSIS — J4520 Mild intermittent asthma, uncomplicated: Secondary | ICD-10-CM | POA: Diagnosis not present

## 2020-07-25 DIAGNOSIS — R52 Pain, unspecified: Secondary | ICD-10-CM | POA: Diagnosis not present

## 2020-07-25 DIAGNOSIS — G8929 Other chronic pain: Secondary | ICD-10-CM | POA: Diagnosis not present

## 2020-07-25 DIAGNOSIS — I4891 Unspecified atrial fibrillation: Secondary | ICD-10-CM | POA: Diagnosis not present

## 2020-07-25 DIAGNOSIS — Z794 Long term (current) use of insulin: Secondary | ICD-10-CM | POA: Diagnosis not present

## 2020-07-25 DIAGNOSIS — I5032 Chronic diastolic (congestive) heart failure: Secondary | ICD-10-CM | POA: Diagnosis not present

## 2020-07-25 LAB — CBC WITH DIFFERENTIAL/PLATELET
Abs Immature Granulocytes: 0.05 10*3/uL (ref 0.00–0.07)
Basophils Absolute: 0.1 10*3/uL (ref 0.0–0.1)
Basophils Relative: 0 %
Eosinophils Absolute: 0 10*3/uL (ref 0.0–0.5)
Eosinophils Relative: 0 %
HCT: 43.3 % (ref 36.0–46.0)
Hemoglobin: 14.4 g/dL (ref 12.0–15.0)
Immature Granulocytes: 0 %
Lymphocytes Relative: 18 %
Lymphs Abs: 2.4 10*3/uL (ref 0.7–4.0)
MCH: 30 pg (ref 26.0–34.0)
MCHC: 33.3 g/dL (ref 30.0–36.0)
MCV: 90.2 fL (ref 80.0–100.0)
Monocytes Absolute: 1.1 10*3/uL — ABNORMAL HIGH (ref 0.1–1.0)
Monocytes Relative: 8 %
Neutro Abs: 9.6 10*3/uL — ABNORMAL HIGH (ref 1.7–7.7)
Neutrophils Relative %: 74 %
Platelets: 173 10*3/uL (ref 150–400)
RBC: 4.8 MIL/uL (ref 3.87–5.11)
RDW: 12.9 % (ref 11.5–15.5)
WBC: 13.2 10*3/uL — ABNORMAL HIGH (ref 4.0–10.5)
nRBC: 0 % (ref 0.0–0.2)

## 2020-07-25 LAB — COMPREHENSIVE METABOLIC PANEL
ALT: 15 U/L (ref 0–44)
AST: 15 U/L (ref 15–41)
Albumin: 3.5 g/dL (ref 3.5–5.0)
Alkaline Phosphatase: 93 U/L (ref 38–126)
Anion gap: 7 (ref 5–15)
BUN: 21 mg/dL (ref 8–23)
CO2: 22 mmol/L (ref 22–32)
Calcium: 9.2 mg/dL (ref 8.9–10.3)
Chloride: 109 mmol/L (ref 98–111)
Creatinine, Ser: 0.89 mg/dL (ref 0.44–1.00)
GFR, Estimated: >60 mL/min (ref >60)
Glucose, Bld: 285 mg/dL — ABNORMAL HIGH (ref 70–99)
Potassium: 3.7 mmol/L (ref 3.5–5.1)
Sodium: 138 mmol/L (ref 135–145)
Total Bilirubin: 0.9 mg/dL (ref 0.3–1.2)
Total Protein: 6.8 g/dL (ref 6.5–8.1)

## 2020-07-25 MED ORDER — LORAZEPAM 2 MG/ML IJ SOLN
1.0000 mg | Freq: Once | INTRAMUSCULAR | Status: AC
  Administered 2020-07-25: 1 mg via INTRAVENOUS
  Filled 2020-07-25: qty 1

## 2020-07-25 MED ORDER — HYDROMORPHONE HCL 1 MG/ML IJ SOLN
1.0000 mg | Freq: Once | INTRAMUSCULAR | Status: AC
  Administered 2020-07-25: 1 mg via INTRAVENOUS
  Filled 2020-07-25: qty 1

## 2020-07-25 MED ORDER — DILTIAZEM HCL ER COATED BEADS 120 MG PO CP24
120.0000 mg | ORAL_CAPSULE | Freq: Every day | ORAL | Status: DC
  Administered 2020-07-25 – 2020-07-26 (×2): 120 mg via ORAL
  Filled 2020-07-25 (×2): qty 1

## 2020-07-25 MED ORDER — HYDROMORPHONE HCL 1 MG/ML IJ SOLN
0.5000 mg | Freq: Once | INTRAMUSCULAR | Status: AC
Start: 2020-07-25 — End: 2020-07-25
  Administered 2020-07-25: 0.5 mg via INTRAVENOUS
  Filled 2020-07-25: qty 1

## 2020-07-25 MED ORDER — HYDROMORPHONE HCL 1 MG/ML IJ SOLN
0.5000 mg | Freq: Once | INTRAMUSCULAR | Status: AC
  Administered 2020-07-25: 0.5 mg via INTRAVENOUS
  Filled 2020-07-25: qty 1

## 2020-07-25 MED ORDER — METOPROLOL TARTRATE 25 MG PO TABS
25.0000 mg | ORAL_TABLET | Freq: Once | ORAL | Status: AC
  Administered 2020-07-25: 25 mg via ORAL
  Filled 2020-07-25: qty 1

## 2020-07-25 NOTE — Evaluation (Signed)
Physical Therapy Evaluation Patient Details Name: Amber Stephenson MRN: [id number] DOB: [birth year] Today's Date: 07/25/2020   History of Present Illness  Pt reports chronic lower back pain. Pt reports that 2 days ago the pain became worse. Pt reports she is unable to walk due to the pain.    Clinical Impression  Patient demonstrates slow labored movement for sitting up at bedside due to increased back pain and generalized weakness, limited to a few side steps at bedside demonstrating slow labored shaky movement of extremities, at high risk for falls and limited for taking steps due to fatigue and poor standing balance.  Patient required Mod/max assist to reposition when put back to bed.  Patient will benefit from continued physical therapy in hospital and recommended venue below to increase strength, balance, endurance for safe ADLs and gait.     Follow Up Recommendations SNF;Supervision for mobility/OOB;Supervision - Intermittent    Equipment Recommendations  None recommended by PT    Recommendations for Other Services       Precautions / Restrictions Precautions Precautions: Fall Restrictions Weight Bearing Restrictions: No      Mobility  Bed Mobility Overal bed mobility: Needs Assistance Bed Mobility: Rolling;Sidelying to Sit;Sit to Sidelying Rolling: Min assist;Mod assist Sidelying to sit: Mod assist     Sit to sidelying: Mod assist General bed mobility comments: slow labored movement    Transfers Overall transfer level: Needs assistance Equipment used: Rolling walker (2 wheeled) Transfers: Sit to/from Stand Sit to Stand: Mod assist         General transfer comment: slow labored movement, unsteady on feet  Ambulation/Gait Ambulation/Gait assistance: Mod assist;Max assist Gait Distance (Feet): 5 Feet Assistive device: Rolling walker (2 wheeled) Gait Pattern/deviations: Decreased step length - right;Decreased step length - left;Decreased stance time -  right Gait velocity: decreased   General Gait Details: limited to 5-6 slow labored unsteady side steps due to fall risk, c/o increasing low back and right foot pain  Stairs            Wheelchair Mobility    Modified Rankin (Stroke Patients Only)       Balance Overall balance assessment: Needs assistance Sitting-balance support: Feet supported;No upper extremity supported Sitting balance-Leahy Scale: Fair Sitting balance - Comments: seated at EOB   Standing balance support: During functional activity;Bilateral upper extremity supported Standing balance-Leahy Scale: Poor Standing balance comment: fair/poor using RW                             Pertinent Vitals/Pain Pain Assessment: Faces Faces Pain Scale: Hurts even more Pain Location: right foot, low back Pain Descriptors / Indicators: Sharp;Aching;Grimacing;Guarding Pain Intervention(s): Limited activity within patient's tolerance;Monitored during session;Repositioned    Home Living Family/patient expects to be discharged to:: Private residence Living Arrangements: Spouse/significant other Available Help at Discharge: Family;Available 24 hours/day Type of Home: House Home Access: Stairs to enter Entrance Stairs-Rails: Left Entrance Stairs-Number of Steps: 5 Home Layout: One level Home Equipment: Shower seat - built in;Cane - single point;Grab bars - toilet;Grab bars - tub/shower Additional Comments: 3 wheeled walker    Prior Function Level of Independence: Needs assistance   Gait / Transfers Assistance Needed: Household and short distanced community ambulator  ADL's / Homemaking Assistance Needed: 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, extensive assist over the past month        Hand Dominance   Dominant Hand: Right    Extremity/Trunk Assessment  Upper Extremity Assessment Upper Extremity Assessment: Generalized weakness    Lower Extremity Assessment Lower  Extremity Assessment: Generalized weakness    Cervical / Trunk Assessment Cervical / Trunk Assessment: Normal  Communication   Communication: No difficulties  Cognition Arousal/Alertness: Awake/alert Behavior During Therapy: WFL for tasks assessed/performed Overall Cognitive Status: Within Functional Limits for tasks assessed                                        General Comments      Exercises     Assessment/Plan    PT Assessment Patient needs continued PT services  PT Problem List Decreased strength;Decreased activity tolerance;Decreased balance;Decreased mobility       PT Treatment Interventions DME instruction;Gait training;Stair training;Functional mobility training;Therapeutic activities;Therapeutic exercise;Balance training;Patient/family education    PT Goals (Current goals can be found in the Care Plan section)  Acute Rehab PT Goals Patient Stated Goal: return home with family to assist PT Goal Formulation: With patient/family Time For Goal Achievement: 08/01/20 Potential to Achieve Goals: Good    Frequency Min 2X/week   Barriers to discharge        Co-evaluation               AM-PAC PT "6 Clicks" Mobility  Outcome Measure Help needed turning from your back to your side while in a flat bed without using bedrails?: A Lot Help needed moving from lying on your back to sitting on the side of a flat bed without using bedrails?: A Lot Help needed moving to and from a bed to a chair (including a wheelchair)?: A Lot Help needed standing up from a chair using your arms (e.g., wheelchair or bedside chair)?: A Lot Help needed to walk in hospital room?: A Lot Help needed climbing 3-5 steps with a railing? : A Lot 6 Click Score: 12    End of Session   Activity Tolerance: Patient tolerated treatment well;Patient limited by fatigue;Patient limited by pain Patient left: in bed;with call bell/phone within reach;with family/visitor present Nurse  Communication: Mobility status PT Visit Diagnosis: Unsteadiness on feet (R26.81);Other abnormalities of gait and mobility (R26.89);Muscle weakness (generalized) (M62.81)    Time: 5284-1324 PT Time Calculation (min) (ACUTE ONLY): 31 min   Charges:   PT Evaluation $PT Eval Moderate Complexity: 1 Mod PT Treatments $Therapeutic Activity: 23-37 mins        3:39 PM, 07/25/20 Lonell Grandchild, MPT Physical Therapist with [facility] [phone] office [phone] mobile phone

## 2020-07-25 NOTE — Plan of Care (Signed)
  Problem: Acute Rehab PT Goals(only PT should resolve) Goal: Pt Will Go Supine/Side To Sit Outcome: Progressing Flowsheets (Taken 07/25/2020 1541) Pt will go Supine/Side to Sit: with minimal assist Goal: Patient Will Transfer Sit To/From Stand Outcome: Progressing Flowsheets (Taken 07/25/2020 1541) Patient will transfer sit to/from stand: with minimal assist Goal: Pt Will Transfer Bed To Chair/Chair To Bed Outcome: Progressing Flowsheets (Taken 07/25/2020 1541) Pt will Transfer Bed to Chair/Chair to Bed: with min assist Goal: Pt Will Ambulate Outcome: Progressing Flowsheets (Taken 07/25/2020 1541) Pt will Ambulate:  25 feet  with minimal assist  with moderate assist  with rolling walker   3:41 PM, 07/25/20 Lonell Grandchild, MPT Physical Therapist with [facility] [phone] office [phone] mobile phone

## 2020-07-25 NOTE — ED Notes (Addendum)
Per husband pt could not stand and pivot to bedside commode.Husband picked her up and put her on bedside toilet.

## 2020-07-25 NOTE — ED Provider Notes (Signed)
[facility] EMERGENCY DEPARTMENT Provider Note   CSN: [id number] Arrival date & time: 07/25/20  5053     History Chief Complaint  Patient presents with  . Back Pain    Amber Stephenson is a 75 y.o. female.  Patient brought in by EMS.  Patient is followed by home physical and rehab medicine.  For chronic back pain.  Patient's past medical history is also significant for atrial fib chronic diastolic congestive heart failure.  Coronary artery disease.  Patient had her medication adjusted where they were trying p.o. hydromorphone.  And trying to wean her from her oxycodone.  This created severe pain.  Patient states she has pain to the lateral aspect of the left foot.  But most of the pain is in the bilateral low back area.  No new fall or injury.  Patient was admitted briefly and November for constellation of problems to include the back pain.  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  Evaluated by neurology and neurosurgery no acute intervention required.  Patient states the pain is so bad that she cannot get up and walk normally walks with a walker.  Was also noted on arrival that she was tachycardic.  Consistent with atrial fib with heart rate in the 130s to 140 range.  Patient had not taken her morning Lopressor or Cardizem CD.  Patient's oxygen saturations here have been 93%.        Past Medical History:  Diagnosis Date  . Acute on chronic respiratory failure with hypoxia ([place]) 01/22/2019  . Atrial fibrillation with RVR 05/19/2017  . Bipolar I disorder 01/23/2015  . CAD (coronary artery disease)    Nonobstructive; Managed by Dr. Bronson Ing  . Cardiomegaly 01/12/2018  . Chronic anticoagulation 05/30/2015  . Chronic back pain    Lower back  . Chronic diastolic CHF (congestive heart failure) 05/30/2015  . Diabetes mellitus, type 2, without complication   . Diabetic neuropathy 02/06/2016  . Dysrhythmia    A-Fib  .  Essential hypertension   . Herpes genitalis in women 07/16/2015  . Hyperlipidemia   . Hypertension   . Hypoxia 02/01/2019  . Insomnia 01/23/2015  . Lipoma 02/08/2015  . Major depressive disorder   . Migraine headache with aura 02/12/2016  . 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[place]) 04/21/2019  . NAFL (nonalcoholic fatty liver) 9/76/7341  . OSA (obstructive sleep apnea) 02/24/2019   10/09/2018 - HST  - AHI 40.6   . Pulmonary hypertension   . Renal insufficiency    Managed by Dr. Wallace Keller  . RLS (restless legs syndrome) 04/27/2015    Patient Active Problem List   Diagnosis Date Noted  . Chronic respiratory failure with hypoxia ([place]) 04/25/2020  . Chronic constipation 04/25/2020  . 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 Fall at home, initial encounter 04/23/2020  . Ambulatory dysfunction--back pain and falls 04/23/2020  . Chronic pain syndrome 08/22/2019  . Nerve pain 08/22/2019  . Myofascial pain dysfunction syndrome 08/22/2019  . Dyspnea 08/22/2019  . CAD (coronary artery disease)   . Hypocalcemia   . S/P shoulder replacement, 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  . History of adenomatous polyp of colon 05/21/2019  . Polypharmacy 05/21/2019  . Benign paroxysmal positional vertigo due to bilateral vestibular disorder 05/20/2019  . Hyperlipidemia associated with type 2 diabetes mellitus ([place]) 05/20/2019  . Vertigo 04/25/2019  .  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[place]) 04/21/2019  . OSA (obstructive sleep apnea) 02/24/2019  . Allergic rhinitis 02/24/2019  . 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 Morbid obesity ([place]) 07/06/2018  . Cardiomegaly 01/12/2018  . Non-alcoholic fatty liver disease 01/12/2018  . Mild intermittent asthma 01/12/2018  . Senile osteoporosis 12/15/2017  . Asthma 11/12/2017  . Fibromyalgia 03/19/2017  . Migraine headache with aura 02/12/2016  . Diabetic neuropathy associated with type 2 diabetes  mellitus ([place]) 02/06/2016  . Bipolar 1 disorder, manic, moderate ([place]) 10/04/2015  . CHF (congestive heart failure) ([place]) 10/04/2015  . Depression, recurrent ([place]) 10/03/2015  . Herpes genitalis in women 07/16/2015  . Long term current use of anticoagulant 05/30/2015  . Family history of coronary arteriosclerosis 05/30/2015  . Chronic diastolic heart failure ([place]) 05/30/2015  . Dyspnea on exertion   . Hypertension associated with diabetes ([place])   . Restless legs 04/27/2015  . Lipoma 02/08/2015  . Insomnia 01/23/2015  . Chronic back pain 01/04/2015  . Chronic atrial fibrillation ([place]) 01/04/2015  . Uncontrolled type 2 diabetes mellitus with peripheral autonomic neuropathy ([place])   . Hyperlipidemia     Past Surgical History:  Procedure Laterality Date  . BREAST REDUCTION SURGERY    . EYE SURGERY Right    cateracts  . HAMMER TOE SURGERY    . LEFT HEART CATH AND CORONARY ANGIOGRAPHY N/A 02/03/2018   Procedure: LEFT HEART CATH AND CORONARY ANGIOGRAPHY;  Surgeon: Martinique, Peter M, MD;  Location: [place] [facility];  Service: Cardiovascular;  Laterality: N/A;  . REVERSE SHOULDER ARTHROPLASTY Right 08/19/2019   Procedure: REVERSE SHOULDER ARTHROPLASTY;  Surgeon: Netta Cedars, MD;  Location: [facility];  Service: Orthopedics;  Laterality: Right;  interscalene block  . SHOULDER SURGERY Right    "I BROKE MY SHOUDLER  . THIGH SURGERY     "TO REMOVE A TUMOR "     OB History   No obstetric history on file.     Family History  Problem Relation Age of Onset  . Diabetes Mother   . Heart disease Mother   . Heart disease Brother   . Heart disease Sister        CABG  . Diabetes Sister   . Alzheimer's disease Father   . Alcohol abuse Sister   . Mental illness Brother   . Diabetes Brother     Social History   Tobacco Use  . Smoking status: Never Smoker  . Smokeless tobacco: Never Used  Vaping Use  . Vaping Use: Never used  Substance Use Topics  . Alcohol use: Not Currently     Alcohol/week: 0.0 standard drinks  . Drug use: No    Home Medications Prior to Admission medications   Medication Sig Start Date End Date Taking? Authorizing Provider  acetaminophen (TYLENOL) 325 MG tablet Take 2 tablets (650 mg total) by mouth every 6 (six) hours as needed for mild pain, fever or headache (or Fever >/= 101). 04/24/20   Roxan Hockey, MD  albuterol (PROVENTIL) (2.5 MG/3ML) 0.083% nebulizer solution Take 3 mLs (2.5 mg total) by nebulization every 6 (six) hours as needed for wheezing or shortness of breath. 05/11/20   Gerlene Fee, NP  albuterol (VENTOLIN HFA) 108 (90 Base) MCG/ACT inhaler Inhale 2 puffs into the lungs every 6 (six) hours as needed for wheezing or shortness of breath. 05/11/20   Gerlene Fee, NP  ARIPiprazole (ABILIFY) 10 MG tablet Take 3 tablets (30 mg total) by mouth daily. 06/13/20   [name], MD  atorvastatin (LIPITOR) 40 MG tablet Take 1 tablet (40 mg total) by mouth daily. 05/11/20   Gerlene Fee, NP  Cholecalciferol (VITAMIN D3) 1000 UNITS CAPS Take 1,000 Units by mouth daily.    [provider]  diltiazem (CARDIZEM CD) 120 MG 24 hr capsule Take 1 capsule (120 mg total) by mouth at bedtime. 05/11/20   Gerlene Fee, NP  ELIQUIS 5 MG TABS tablet TAKE 1 TABLET TWICE A DAY 07/19/20   [name], MD  escitalopram (LEXAPRO) 10 MG tablet Take 1 tablet (10 mg total) by mouth daily. 05/11/20   Gerlene Fee, NP  flurazepam (DALMANE) 30 MG capsule Take 30 mg by mouth at bedtime as needed. 05/24/20   [provider]  Fluticasone-Salmeterol (ADVAIR DISKUS) 500-50 MCG/DOSE AEPB Inhale 1 puff into the lungs in the morning and at bedtime. 06/04/20   [name], MD  furosemide (LASIX) 40 MG tablet TAKE 1 TABLET EVERY MORNING 05/11/20   Gerlene Fee, NP  HYDROmorphone (DILAUDID) 2 MG tablet Take 1 tablet (2 mg total) by mouth every 6 (six) hours as needed for severe pain. 07/11/20   Lovorn, Jinny Blossom, MD  insulin aspart  (NOVOLOG) 100 UNIT/ML injection Inject 10 Units into the skin 3 (three) times daily before meals. Special Instructions: hold for cbg <120 05/11/20   Gerlene Fee, NP  insulin glargine (LANTUS SOLOSTAR) 100 UNIT/ML Solostar Pen Inject 50 Units into the skin 2 (two) times daily. 05/29/20   [name], MD  ketoconazole (NIZORAL) 2 % cream Apply to affected areas in groin region ONE time daily for 3-4 weeks. 06/28/20   [name], MD  LORazepam (ATIVAN) 1 MG tablet Take 1-2 tablets (1-2 mg total) by mouth See admin instructions. 1 mg QAM and 2 mg QHS 05/11/20   Gerlene Fee, NP  metoprolol tartrate (LOPRESSOR) 25 MG tablet Take 1 tablet (25 mg total) by mouth 2 (two) times daily. 07/19/20   Strader, Fransisco Hertz, PA-C  Multiple Vitamin (MULTIVITAMIN WITH MINERALS) TABS tablet Take 1 tablet by mouth daily. 04/25/20   Roxan Hockey, MD  NON FORMULARY Diet: _____ Regular, ___x___ NAS, ___x____Consistent Carbohydrate, _______NPO _____Other 04/24/20   [provider]  oxyCODONE-acetaminophen (PERCOCET/ROXICET) 5-325 MG tablet Take 1 tablet by mouth every 6 (six) hours as needed for severe pain. 06/11/20 06/11/21  Lovorn, Jinny Blossom, MD  polyethylene glycol (MIRALAX / GLYCOLAX) 17 g packet Take 17 g by mouth daily. 04/25/20   Roxan Hockey, MD  potassium chloride SA (KLOR-CON) 20 MEQ tablet Take 1 tablet (20 mEq total) by mouth daily. 05/11/20   Gerlene Fee, NP  pregabalin (LYRICA) 200 MG capsule Take 1 capsule (200 mg total) by mouth 2 (two) times daily. 05/11/20 06/10/20  Gerlene Fee, NP  rizatriptan (MAXALT) 10 MG tablet Take 1 tablet (10 mg total) by mouth daily as needed for migraine. Take one tablet at onset of migraine. Do not take more than 3 tabs max per week. 05/11/20   Gerlene Fee, NP  topiramate (TOPAMAX) 25 MG tablet Take 3 tablets (75 mg total) by mouth at bedtime. 05/11/20   Gerlene Fee, NP  VASCEPA 1 g capsule TAKE 2 CAPSULES TWICE A DAY WITH MEALS 07/19/20    [name], MD    Allergies    Ivp dye [iodinated diagnostic agents] and Latex  Review of Systems   Review of Systems  Constitutional: Negative for chills and fever.  HENT: Negative for rhinorrhea and sore throat.   Eyes:  Negative for visual disturbance.  Respiratory: Negative for cough and shortness of breath.   Cardiovascular: Negative for chest pain and leg swelling.  Gastrointestinal: Negative for abdominal pain, diarrhea, nausea and vomiting.  Genitourinary: Negative for dysuria.  Musculoskeletal: Positive for back pain. Negative for neck pain.  Skin: Negative for rash.  Neurological: Positive for weakness. Negative for dizziness, light-headedness and headaches.  Hematological: Does not bruise/bleed easily.  Psychiatric/Behavioral: Negative for confusion.    Physical Exam Updated Vital Signs BP 112/73   Pulse 90   Temp 99.5 F (37.5 C) (Oral)   Resp (!) 27   SpO2 98%   Physical Exam Vitals and nursing note reviewed.  Constitutional:      General: She is not in acute distress.    Appearance: Normal appearance. She is well-developed and well-nourished.  HENT:     Head: Normocephalic and atraumatic.  Eyes:     Extraocular Movements: Extraocular movements intact.     Conjunctiva/sclera: Conjunctivae normal.     Pupils: Pupils are equal, round, and reactive to light.  Cardiovascular:     Rate and Rhythm: Tachycardia present. Rhythm irregular.     Heart sounds: No murmur heard.   Pulmonary:     Effort: Pulmonary effort is normal. No respiratory distress.     Breath sounds: Normal breath sounds.  Abdominal:     Palpations: Abdomen is soft.     Tenderness: There is no abdominal tenderness.  Musculoskeletal:        General: No swelling or edema. Normal range of motion.     Cervical back: Normal range of motion and neck supple.  Skin:    General: Skin is warm and dry.     Capillary Refill: Capillary refill takes less than 2 seconds.  Neurological:      General: No focal deficit present.     Mental Status: She is alert and oriented to person, place, and time.     Cranial Nerves: No cranial nerve deficit.     Sensory: No sensory deficit.     Motor: Weakness present.     Comments: Patient with weakness to both lower extremities.  But seems to be more secondary to pain in the low part of the back.  Psychiatric:        Mood and Affect: Mood and affect normal.     ED Results / Procedures / Treatments   Labs (all labs ordered are listed, but only abnormal results are displayed) Labs Reviewed  CBC WITH DIFFERENTIAL/PLATELET - Abnormal; Notable for the following components:      Result Value   WBC 13.2 (*)    Neutro Abs 9.6 (*)    Monocytes Absolute 1.1 (*)    All other components within normal limits  COMPREHENSIVE METABOLIC PANEL - Abnormal; Notable for the following components:   Glucose, Bld 285 (*)    All other components within normal limits    EKG EKG Interpretation  Date/Time:  Wednesday July 25 2020 07:34:17 EST Ventricular Rate:  132 PR Interval:    QRS Duration: 74 QT Interval:  323 QTC Calculation: 479 R Axis:   -35 Text Interpretation: Atrial fibrillation Ventricular bigeminy Left axis deviation Abnormal R-wave progression, early transition Nonspecific T abnormalities, lateral leads Confirmed by Fredia Sorrow [phone]) on 07/25/2020 7:42:49 AM   Radiology MR Lumbar Spine Wo Contrast  Result Date: 07/25/2020 CLINICAL DATA:  Lumbosacral radiculopathy.  Chronic low back pain EXAM: MRI LUMBAR SPINE WITHOUT CONTRAST TECHNIQUE: Multiplanar, multisequence MR imaging of the lumbar  spine was performed. No intravenous contrast was administered. COMPARISON:  Lumbar MRI 04/23/2020, 03/27/2019 FINDINGS: Segmentation:  Normal Alignment:  Slight retrolisthesis L2-3 and L4-5 Vertebrae: Chronic fractures superior endplates of Z76 and B34 unchanged from prior study. No acute fracture or skeletal lesion. Conus medullaris and cauda  equina: Conus extends to the L1-2 level. Conus medullaris normal. 3 x 5 mm lesion in the dorsal spinal canal at the L2 level unchanged from prior studies. This is best seen on sagittal T2 images and difficult to see on axial images. This appears intradural. Paraspinal and other soft tissues: Negative for paraspinous mass or adenopathy. Disc levels: L1-2: Shallow right paracentral disc protrusion, unchanged from the prior study. No significant stenosis. L2-3: Mild disc bulging and mild facet degeneration. Small annular fissure centrally. No disc protrusion or stenosis L3-4: Disc degeneration with mild disc bulging. Mild facet degeneration. Negative for stenosis L4-5: Disc degeneration with diffuse disc bulging and bilateral facet degeneration. Asymmetric disc bulging and spurring on the left with moderate subarticular stenosis on the left. No interval change from the prior study. L5-S1: Mild facet degeneration.  Negative for stenosis. IMPRESSION: 3 x 5 mm nodule in the dorsal spinal canal at the L2 level likely intramural. No change from prior studies. 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 Multilevel degenerative changes, stable from the prior study. Electronically Signed   By: Franchot Gallo M.D.   On: 07/25/2020 12:31    Procedures Procedures   Medications Ordered in ED Medications  diltiazem (CARDIZEM CD) 24 hr capsule 120 mg (120 mg Oral Given 07/25/20 0916)  HYDROmorphone (DILAUDID) injection 1 mg (1 mg Intravenous Given 07/25/20 0804)  metoprolol tartrate (LOPRESSOR) tablet 25 mg (25 mg Oral Given 07/25/20 0804)  HYDROmorphone (DILAUDID) injection 0.5 mg (0.5 mg Intravenous Given 07/25/20 0916)  HYDROmorphone (DILAUDID) injection 0.5 mg (0.5 mg Intravenous Given 07/25/20 1022)  HYDROmorphone (DILAUDID) injection 0.5 mg (0.5 mg Intravenous Given 07/25/20 1128)  LORazepam (ATIVAN) injection 1 mg (1 mg Intravenous Given 07/25/20 1127)    ED Course  I have reviewed the triage vital signs and the nursing  notes.  Pertinent labs & imaging results that were available during my care of the patient were reviewed by me and considered in my medical decision making (see chart for details).    MDM Rules/Calculators/A&P                         Patient treated with hydromorphone IV.  And titrated.  Patient appearing much more comfortable.  Patient's husband is her primary care giver would prefer for her to go home.  We felt her rehab at a point where she was moving better.  The Lopressor and her Cardizem CD dose has her heart rate now down at 100 her a little below.  So we got good rate control there.  Labs significant for blood sugar being elevated and white count of 13.2 MRI was reordered to reevaluate.  3 x 5 mm nodule in the dorsal spine canal at L2 level likely intra or mural no change from prior studies possible new or sheath tumor.  But there is still some unknowns about this.  Attempted to ambulate after pain control.  Patient states she is too weak.  States that she needs to be evaluated by physical therapy for rehab placement.  Patient unable to ambulate so therefore unable to go home.  Will discuss with hospitalist in case there could be a reason for admission.  Patient's blood sugars  are up a little bit at 285.  Patient is followed by [facility].   Patient not a candidate for hospital admission.  We will have physical therapy and social worker get involved for placement.  Did have neurosurgery on call today Dr. Christella Noa review the MRI and he felt that there was nothing significant there.  Very small not playing a role.  Nothing needs to be done.  He was particularly confident that there is been no change in 3 months.  Final Clinical Impression(s) / ED Diagnoses Final diagnoses:  Chronic bilateral low back pain with right-sided sciatica    Rx / DC Orders ED Discharge Orders    None       Fredia Sorrow, MD 07/25/20 1540

## 2020-07-25 NOTE — ED Notes (Signed)
Verbal to eat food received. Tray requested

## 2020-07-25 NOTE — ED Provider Notes (Signed)
Patient care assumed by previous provider.  Briefly this is a 75 year old female with acute on chronic back pain who is stable from a neuro/neurosurgical standpoint.  Patient is unable to go home secondary to pain control, patient and family are agreeable to placement, social work and physical therapy have been involved. Physical Exam  BP (!) 99/57 (BP Location: Left Arm)   Pulse 93   Temp 99.5 F (37.5 C) (Oral)   Resp (!) 22   SpO2 97%   Physical Exam Vitals and nursing note reviewed.  Constitutional:      Appearance: Normal appearance.  HENT:     Head: Normocephalic.     Mouth/Throat:     Mouth: Mucous membranes are moist.  Pulmonary:     Effort: Pulmonary effort is normal. No respiratory distress.  Skin:    General: Skin is warm.  Neurological:     Mental Status: She is alert and oriented to person, place, and time. Mental status is at baseline.  Psychiatric:        Mood and Affect: Mood normal.     ED Course/Procedures     Procedures  MDM  Patient has been seen by transitions of care, she is pending placement.  Currently laying in bed, stable vitals, ongoing back pain without any acute change. Patient pending placement, currently stable and unchanged.       Lorelle Gibbs, DO 07/25/20 2005

## 2020-07-25 NOTE — ED Notes (Signed)
purewick placed at this time. Pt unsteady standing with walker, unable to walk at this time with walker and/or assistance. Pt agreed to purewick to reduce risk for falls. Husband instructed to no longer get pt up to bedside commode. Husband at bedside, bed in lowest position, call light w/in reach.

## 2020-07-25 NOTE — NC FL2 (Addendum)
[place] MEDICAID FL2 LEVEL OF CARE SCREENING TOOL     IDENTIFICATION  Patient Name: Amber Stephenson Birthdate: 1946-01-13 Sex: female Admission Date (Current Location): 07/25/2020  [facility] and [place] Number:  [employer] and Address:  [place] [address], [place]      Provider Number: [id number]  Attending Physician Name and Address:  Fredia Sorrow, MD  Relative Name and Phone Number:  Kora Groom (husband) Ph: [phone]    Current Level of Care: Hospital Recommended Level of Care: [place] Prior Approval Number:    Date Approved/Denied:   PASRR Number:    Discharge Plan: SNF    Current Diagnoses: Patient Active Problem List   Diagnosis Date Noted  . Chronic respiratory failure with hypoxia ([place]) 04/25/2020  . Chronic constipation 04/25/2020  . 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 Fall at home, initial encounter 04/23/2020  . Ambulatory dysfunction--back pain and falls 04/23/2020  . Chronic pain syndrome 08/22/2019  . Nerve pain 08/22/2019  . Myofascial pain dysfunction syndrome 08/22/2019  . Dyspnea 08/22/2019  . CAD (coronary artery disease)   . Hypocalcemia   . S/P shoulder replacement, 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  . History of adenomatous polyp of colon 05/21/2019  . Polypharmacy 05/21/2019  . Benign paroxysmal positional vertigo due to bilateral vestibular disorder 05/20/2019  . Hyperlipidemia associated with type 2 diabetes mellitus ([name]) 05/20/2019  . Vertigo 04/25/2019  . 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[place]) 04/21/2019  . OSA (obstructive sleep apnea) 02/24/2019  . Allergic rhinitis 02/24/2019  . 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 Morbid obesity ([place]) 07/06/2018  . Cardiomegaly 01/12/2018  . Non-alcoholic fatty liver disease 01/12/2018  . Mild intermittent asthma 01/12/2018  . Senile osteoporosis  12/15/2017  . Asthma 11/12/2017  . Fibromyalgia 03/19/2017  . Migraine headache with aura 02/12/2016  . Diabetic neuropathy associated with type 2 diabetes mellitus ([place]) 02/06/2016  . Bipolar 1 disorder, manic, moderate ([place]) 10/04/2015  . CHF (congestive heart failure) ([place]) 10/04/2015  . Depression, recurrent ([place]) 10/03/2015  . Herpes genitalis in women 07/16/2015  . Long term current use of anticoagulant 05/30/2015  . Family history of coronary arteriosclerosis 05/30/2015  . Chronic diastolic heart failure ([place]) 05/30/2015  . Dyspnea on exertion   . Hypertension associated with diabetes ([place])   . Restless legs 04/27/2015  . Lipoma 02/08/2015  . Insomnia 01/23/2015  . Chronic back pain 01/04/2015  . Chronic atrial fibrillation ([place]) 01/04/2015  . Uncontrolled type 2 diabetes mellitus with peripheral autonomic neuropathy ([place])   . Hyperlipidemia     Orientation RESPIRATION BLADDER Height & Weight     Self,Time,Situation,Place  Normal Incontinent Weight:   Height:     BEHAVIORAL SYMPTOMS/MOOD NEUROLOGICAL BOWEL NUTRITION STATUS      Continent Diet  AMBULATORY STATUS COMMUNICATION OF NEEDS Skin   Extensive Assist Verbally Normal                       Personal Care Assistance Level of Assistance  Bathing,Feeding,Dressing Bathing Assistance: Maximum assistance Feeding assistance: Independent Dressing Assistance: Limited assistance     Functional Limitations Info  Sight,Hearing,Speech Sight Info: Adequate Hearing Info: Adequate Speech Info: Adequate    SPECIAL CARE FACTORS FREQUENCY  PT (By licensed PT)     PT Frequency: 5x's/week              Contractures Contractures Info: Not present  Additional Factors Info  Code Status,Allergies Code Status Info: Full Allergies Info: Ivp Dye (Iodinated Diagnostic Agents); Latex           Current Medications (07/25/2020):  This is the current hospital active medication list Current Facility-Administered  Medications  Medication Dose Route Frequency Provider Last Rate Last Admin  . diltiazem (CARDIZEM CD) 24 hr capsule 120 mg  120 mg Oral Daily Fredia Sorrow, MD   120 mg at 07/25/20 7494   Current Outpatient Medications  Medication Sig Dispense Refill  . acetaminophen (TYLENOL) 325 MG tablet Take 2 tablets (650 mg total) by mouth every 6 (six) hours as needed for mild pain, fever or headache (or Fever >/= 101). 15 tablet 1  . albuterol (PROVENTIL) (2.5 MG/3ML) 0.083% nebulizer solution Take 3 mLs (2.5 mg total) by nebulization every 6 (six) hours as needed for wheezing or shortness of breath. 150 mL 0  . albuterol (VENTOLIN HFA) 108 (90 Base) MCG/ACT inhaler Inhale 2 puffs into the lungs every 6 (six) hours as needed for wheezing or shortness of breath. 1 each 0  . ARIPiprazole (ABILIFY) 10 MG tablet Take 3 tablets (30 mg total) by mouth daily. 90 tablet 1  . atorvastatin (LIPITOR) 40 MG tablet Take 1 tablet (40 mg total) by mouth daily. 30 tablet 0  . Cholecalciferol (VITAMIN D3) 1000 UNITS CAPS Take 1,000 Units by mouth daily.    Marland Kitchen diltiazem (CARDIZEM CD) 120 MG 24 hr capsule Take 1 capsule (120 mg total) by mouth at bedtime. 30 capsule 0  . ELIQUIS 5 MG TABS tablet TAKE 1 TABLET TWICE A DAY 180 tablet 3  . escitalopram (LEXAPRO) 10 MG tablet Take 1 tablet (10 mg total) by mouth daily. 30 tablet 0  . flurazepam (DALMANE) 30 MG capsule Take 30 mg by mouth at bedtime as needed.    . Fluticasone-Salmeterol (ADVAIR DISKUS) 500-50 MCG/DOSE AEPB Inhale 1 puff into the lungs in the morning and at bedtime. 60 each 11  . furosemide (LASIX) 40 MG tablet TAKE 1 TABLET EVERY MORNING 30 tablet 0  . HYDROmorphone (DILAUDID) 2 MG tablet Take 1 tablet (2 mg total) by mouth every 6 (six) hours as needed for severe pain. 120 tablet 0  . insulin aspart (NOVOLOG) 100 UNIT/ML injection Inject 10 Units into the skin 3 (three) times daily before meals. Special Instructions: hold for cbg <120 10 mL 0  . insulin  glargine (LANTUS SOLOSTAR) 100 UNIT/ML Solostar Pen Inject 50 Units into the skin 2 (two) times daily. 30 mL 5  . ketoconazole (NIZORAL) 2 % cream Apply to affected areas in groin region ONE time daily for 3-4 weeks. 60 g 1  . LORazepam (ATIVAN) 1 MG tablet Take 1-2 tablets (1-2 mg total) by mouth See admin instructions. 1 mg QAM and 2 mg QHS 90 tablet 0  . metoprolol tartrate (LOPRESSOR) 25 MG tablet Take 1 tablet (25 mg total) by mouth 2 (two) times daily. 180 tablet 3  . Multiple Vitamin (MULTIVITAMIN WITH MINERALS) TABS tablet Take 1 tablet by mouth daily. 30 tablet 3  . NON FORMULARY Diet: _____ Regular, ___x___ NAS, ___x____Consistent Carbohydrate, _______NPO _____Other    . oxyCODONE-acetaminophen (PERCOCET/ROXICET) 5-325 MG tablet Take 1 tablet by mouth every 6 (six) hours as needed for severe pain. 120 tablet 0  . polyethylene glycol (MIRALAX / GLYCOLAX) 17 g packet Take 17 g by mouth daily. 14 each 0  . potassium chloride SA (KLOR-CON) 20 MEQ tablet Take 1 tablet (20 mEq total) by  mouth daily. 30 tablet 0  . pregabalin (LYRICA) 200 MG capsule Take 1 capsule (200 mg total) by mouth 2 (two) times daily. 60 capsule 0  . rizatriptan (MAXALT) 10 MG tablet Take 1 tablet (10 mg total) by mouth daily as needed for migraine. Take one tablet at onset of migraine. Do not take more than 3 tabs max per week. 10 tablet 0  . topiramate (TOPAMAX) 25 MG tablet Take 3 tablets (75 mg total) by mouth at bedtime. 90 tablet 0  . VASCEPA 1 g capsule TAKE 2 CAPSULES TWICE A DAY WITH MEALS 360 capsule 3   Facility-Administered Medications Ordered in Other Encounters  Medication Dose Route Frequency Provider Last Rate Last Admin  . bupivacaine-epinephrine (MARCAINE W/ EPI) 0.5% -1:200000 injection    Anesthesia Intra-op Effie Berkshire, MD   12 mL at 01/21/19 0935     Discharge Medications: Please see discharge summary for a list of discharge medications.  Relevant Imaging Results:  Relevant Lab  Results:   Additional Information SSN: [id number]  Sherie Don, LCSW

## 2020-07-25 NOTE — ED Notes (Signed)
Pt verbalized she goes to a pain clinic for her back pain. No injury noted in last two days. Some pain noted to her right foot also no injury noted.Back hurts worse when ambulating per pt.

## 2020-07-25 NOTE — TOC Initial Note (Signed)
Transition of Care [facility]) - Initial/Assessment Note    Patient Details  Name: Amber Stephenson MRN: [id number] Date of Birth: 17-Jul-1945  [facility]) CM/SW Contact:    Iona Beard, [place] Phone Number: 07/25/2020, 5:21 PM  Clinical Narrative:                 CSW met with pt and spouse in ED to inform of PT recommending SNF. Per pt and spouse they are interested in pt going to SNF. CSW informed that we will start referral process and update as soon as there is a bed offer. CSW to send pt referral out to all local facilities, pending PASRR. Pt and spouse state first choice is [facility], CSW informed we will send referral there however we will need to move with whatever bed offer we get. Pt and spouse understanding. TOC to follow.   Expected Discharge Plan: Skilled Nursing Facility Barriers to Discharge: Continued Medical Work up   Patient Goals and CMS Choice Patient states their goals for this hospitalization and ongoing recovery are:: Go to SNF CMS Medicare.gov Compare Post Acute Care list provided to:: Patient Choice offered to / list presented to : [facility]  Expected Discharge Plan and Services Expected Discharge Plan: [place] In-house Referral: Clinical Social Work Discharge Planning Services: CM Consult Post Acute Care Choice: [employer] arrangements for the past 2 months: Single Family Home                 DME Arranged: N/A DME Agency: NA       HH Arranged: NA HH Agency: NA        Prior Living Arrangements/Services Living arrangements for the past 2 months: Single Family Home Lives with:: Spouse Patient language and need for interpreter reviewed:: Yes Do you feel safe going back to the place where you live?: Yes      Need for Family Participation in Patient Care: Yes (Comment) Care giver support system in place?: Yes (comment)   Criminal Activity/Legal Involvement Pertinent to Current  Situation/Hospitalization: No - Comment as needed  Activities of Daily Living      Permission Sought/Granted                  Emotional Assessment Appearance:: Appears stated age Attitude/Demeanor/Rapport: Engaged Affect (typically observed): Accepting Orientation: : Oriented to Self,Oriented to Place,Oriented to  Time,Oriented to Situation Alcohol / Substance Use: Not Applicable Psych Involvement: No (comment)  Admission diagnosis:  EMS Patient Active Problem List   Diagnosis Date Noted  . Chronic respiratory failure with hypoxia ([place]) 04/25/2020  . Chronic constipation 04/25/2020  . 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 Fall at home, initial encounter 04/23/2020  . Ambulatory dysfunction--back pain and falls 04/23/2020  . Chronic pain syndrome 08/22/2019  . Nerve pain 08/22/2019  . Myofascial pain dysfunction syndrome 08/22/2019  . Dyspnea 08/22/2019  . CAD (coronary artery disease)   . Hypocalcemia   . S/P shoulder replacement, 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  . History of adenomatous polyp of colon 05/21/2019  . Polypharmacy 05/21/2019  . Benign paroxysmal positional vertigo due to bilateral vestibular disorder 05/20/2019  . Hyperlipidemia associated with type 2 diabetes mellitus ([place]) 05/20/2019  . Vertigo 04/25/2019  . 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[place]) 04/21/2019  . OSA (obstructive sleep apnea) 02/24/2019  . Allergic rhinitis 02/24/2019  . 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 Morbid obesity ([place]) 07/06/2018  .  Cardiomegaly 01/12/2018  . Non-alcoholic fatty liver disease 01/12/2018  . Mild intermittent asthma 01/12/2018  . Senile osteoporosis 12/15/2017  . Asthma 11/12/2017  . Fibromyalgia 03/19/2017  . Migraine headache with aura 02/12/2016  . Diabetic neuropathy associated with type 2 diabetes mellitus ([place]) 02/06/2016  . Bipolar 1 disorder, manic, moderate ([place]) 10/04/2015   . CHF (congestive heart failure) ([place]) 10/04/2015  . Depression, recurrent ([place]) 10/03/2015  . Herpes genitalis in women 07/16/2015  . Long term current use of anticoagulant 05/30/2015  . Family history of coronary arteriosclerosis 05/30/2015  . Chronic diastolic heart failure ([place]) 05/30/2015  . Dyspnea on exertion   . Hypertension associated with diabetes ([place])   . Restless legs 04/27/2015  . Lipoma 02/08/2015  . Insomnia 01/23/2015  . Chronic back pain 01/04/2015  . Chronic atrial fibrillation ([place]) 01/04/2015  . 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 Hyperlipidemia    PCP:  [name], MD Pharmacy:   [facility], [place] [address] [place] [address] Phone: [phone] Fax: [phone]  [facility] [phone] - [place], [place] AT [place] [address] [place] [address] Phone: [phone] Fax: [phone]  [facility] - [place], [place] - [address] [place] [address] Phone: [phone] Fax: [phone]     Social Determinants of Health (SDOH) Interventions    Readmission Risk Interventions No flowsheet data found.

## 2020-07-25 NOTE — ED Notes (Signed)
Physical therapy verbalized pt can one person stand & pivot with walker but did not advise ambulation at this time. Recommendation of SNF.

## 2020-07-25 NOTE — ED Notes (Signed)
Pt taken by wheelchair to bathroom. Did stand and pivot with assistance. Pt transported back to bed without incident.

## 2020-07-25 NOTE — ED Notes (Signed)
Pt verbalized pain is a 9 prior to pain meds and after. Stated pain medication is not effective. Nurse stayed to visual effects after IV med. Pt did verbalize rate down to a 6. Pt noted to close her eyes several times while nurse waited for effectiveness.

## 2020-07-25 NOTE — ED Triage Notes (Signed)
Pt reports chronic lower back pain. Pt reports that 2 days ago the pain became worse. Pt reports she is unable to walk due to the pain. Pt repots taking 1 oxycodone PTA this morning.

## 2020-07-26 ENCOUNTER — Inpatient Hospital Stay
Admission: RE | Admit: 2020-07-26 | Discharge: 2020-08-10 | Disposition: A | Discharge status: Home / Self Care | Payer: Medicare Other | Source: Ambulatory Visit | Attending: Internal Medicine | Admitting: Internal Medicine

## 2020-07-26 DIAGNOSIS — J4520 Mild intermittent asthma, uncomplicated: Secondary | ICD-10-CM | POA: Diagnosis not present

## 2020-07-26 DIAGNOSIS — E1165 Type 2 diabetes mellitus with hyperglycemia: Secondary | ICD-10-CM | POA: Diagnosis not present

## 2020-07-26 DIAGNOSIS — E1159 Type 2 diabetes mellitus with other circulatory complications: Secondary | ICD-10-CM | POA: Diagnosis not present

## 2020-07-26 DIAGNOSIS — K5909 Other constipation: Secondary | ICD-10-CM | POA: Diagnosis not present

## 2020-07-26 DIAGNOSIS — M5442 Lumbago with sciatica, left side: Secondary | ICD-10-CM | POA: Diagnosis not present

## 2020-07-26 DIAGNOSIS — E662 Morbid (severe) obesity with alveolar hypoventilation: Secondary | ICD-10-CM | POA: Diagnosis not present

## 2020-07-26 DIAGNOSIS — R2681 Unsteadiness on feet: Secondary | ICD-10-CM | POA: Diagnosis not present

## 2020-07-26 DIAGNOSIS — Z9189 Other specified personal risk factors, not elsewhere classified: Secondary | ICD-10-CM | POA: Diagnosis not present

## 2020-07-26 DIAGNOSIS — E1142 Type 2 diabetes mellitus with diabetic polyneuropathy: Secondary | ICD-10-CM | POA: Diagnosis not present

## 2020-07-26 DIAGNOSIS — R000 Tachycardia, unspecified: Secondary | ICD-10-CM | POA: Diagnosis not present

## 2020-07-26 DIAGNOSIS — R262 Difficulty in walking, not elsewhere classified: Secondary | ICD-10-CM | POA: Diagnosis not present

## 2020-07-26 DIAGNOSIS — M79671 Pain in right foot: Secondary | ICD-10-CM | POA: Diagnosis not present

## 2020-07-26 DIAGNOSIS — H8113 Benign paroxysmal vertigo, bilateral: Secondary | ICD-10-CM | POA: Diagnosis not present

## 2020-07-26 DIAGNOSIS — G8929 Other chronic pain: Secondary | ICD-10-CM | POA: Diagnosis not present

## 2020-07-26 DIAGNOSIS — M5441 Lumbago with sciatica, right side: Secondary | ICD-10-CM | POA: Diagnosis not present

## 2020-07-26 DIAGNOSIS — G894 Chronic pain syndrome: Secondary | ICD-10-CM | POA: Diagnosis not present

## 2020-07-26 DIAGNOSIS — M7989 Other specified soft tissue disorders: Secondary | ICD-10-CM | POA: Diagnosis not present

## 2020-07-26 DIAGNOSIS — J449 Chronic obstructive pulmonary disease, unspecified: Secondary | ICD-10-CM | POA: Diagnosis not present

## 2020-07-26 DIAGNOSIS — M797 Fibromyalgia: Secondary | ICD-10-CM | POA: Diagnosis not present

## 2020-07-26 DIAGNOSIS — M7918 Myalgia, other site: Secondary | ICD-10-CM | POA: Diagnosis not present

## 2020-07-26 DIAGNOSIS — G4733 Obstructive sleep apnea (adult) (pediatric): Secondary | ICD-10-CM | POA: Diagnosis not present

## 2020-07-26 DIAGNOSIS — R2689 Other abnormalities of gait and mobility: Secondary | ICD-10-CM | POA: Diagnosis not present

## 2020-07-26 DIAGNOSIS — I482 Chronic atrial fibrillation, unspecified: Secondary | ICD-10-CM | POA: Diagnosis not present

## 2020-07-26 DIAGNOSIS — I10 Essential (primary) hypertension: Secondary | ICD-10-CM | POA: Diagnosis not present

## 2020-07-26 DIAGNOSIS — I2510 Atherosclerotic heart disease of native coronary artery without angina pectoris: Secondary | ICD-10-CM | POA: Diagnosis not present

## 2020-07-26 DIAGNOSIS — M549 Dorsalgia, unspecified: Secondary | ICD-10-CM | POA: Diagnosis not present

## 2020-07-26 DIAGNOSIS — E785 Hyperlipidemia, unspecified: Secondary | ICD-10-CM | POA: Diagnosis not present

## 2020-07-26 DIAGNOSIS — I5032 Chronic diastolic (congestive) heart failure: Secondary | ICD-10-CM | POA: Diagnosis not present

## 2020-07-26 DIAGNOSIS — J9611 Chronic respiratory failure with hypoxia: Secondary | ICD-10-CM | POA: Diagnosis not present

## 2020-07-26 DIAGNOSIS — F339 Major depressive disorder, recurrent, unspecified: Secondary | ICD-10-CM | POA: Diagnosis not present

## 2020-07-26 DIAGNOSIS — I152 Hypertension secondary to endocrine disorders: Secondary | ICD-10-CM | POA: Diagnosis not present

## 2020-07-26 DIAGNOSIS — E1143 Type 2 diabetes mellitus with diabetic autonomic (poly)neuropathy: Secondary | ICD-10-CM | POA: Diagnosis not present

## 2020-07-26 DIAGNOSIS — E1169 Type 2 diabetes mellitus with other specified complication: Secondary | ICD-10-CM | POA: Diagnosis not present

## 2020-07-26 DIAGNOSIS — Z20822 Contact with and (suspected) exposure to covid-19: Secondary | ICD-10-CM | POA: Diagnosis not present

## 2020-07-26 DIAGNOSIS — G959 Disease of spinal cord, unspecified: Secondary | ICD-10-CM | POA: Diagnosis not present

## 2020-07-26 DIAGNOSIS — M461 Sacroiliitis, not elsewhere classified: Secondary | ICD-10-CM | POA: Diagnosis not present

## 2020-07-26 DIAGNOSIS — M6281 Muscle weakness (generalized): Secondary | ICD-10-CM | POA: Diagnosis not present

## 2020-07-26 LAB — RESP PANEL BY RT-PCR (FLU A&B, COVID) ARPGX2
Influenza A by PCR: NEGATIVE
Influenza B by PCR: NEGATIVE
SARS Coronavirus 2 by RT PCR: NEGATIVE

## 2020-07-26 LAB — CBG MONITORING, ED: Glucose-Capillary: 254 mg/dL — ABNORMAL HIGH (ref 70–99)

## 2020-07-26 MED ORDER — INSULIN GLARGINE 100 UNIT/ML ~~LOC~~ SOLN
50.0000 [IU] | Freq: Two times a day (BID) | SUBCUTANEOUS | Status: DC
  Administered 2020-07-26: 50 [IU] via SUBCUTANEOUS
  Filled 2020-07-26 (×3): qty 0.5

## 2020-07-26 MED ORDER — FUROSEMIDE 40 MG PO TABS
40.0000 mg | ORAL_TABLET | Freq: Every day | ORAL | Status: DC
  Administered 2020-07-26: 40 mg via ORAL
  Filled 2020-07-26: qty 1

## 2020-07-26 MED ORDER — APIXABAN 5 MG PO TABS
5.0000 mg | ORAL_TABLET | Freq: Two times a day (BID) | ORAL | Status: DC
  Administered 2020-07-26: 5 mg via ORAL
  Filled 2020-07-26: qty 1

## 2020-07-26 MED ORDER — DILTIAZEM HCL ER COATED BEADS 120 MG PO CP24: 120.0000 mg | ORAL_CAPSULE | Freq: Every day | ORAL | Status: DC

## 2020-07-26 MED ORDER — METOPROLOL TARTRATE 25 MG PO TABS
25.0000 mg | ORAL_TABLET | Freq: Two times a day (BID) | ORAL | Status: DC
  Administered 2020-07-26: 25 mg via ORAL
  Filled 2020-07-26: qty 1

## 2020-07-26 MED ORDER — POLYETHYLENE GLYCOL 3350 17 G PO PACK
17.0000 g | PACK | Freq: Every day | ORAL | Status: DC
  Administered 2020-07-26: 17 g via ORAL
  Filled 2020-07-26: qty 1

## 2020-07-26 MED ORDER — INSULIN ASPART 100 UNIT/ML ~~LOC~~ SOLN
10.0000 [IU] | Freq: Three times a day (TID) | SUBCUTANEOUS | Status: DC
  Administered 2020-07-26: 10 [IU] via SUBCUTANEOUS
  Filled 2020-07-26: qty 1

## 2020-07-26 MED ORDER — PREGABALIN 75 MG PO CAPS
200.0000 mg | ORAL_CAPSULE | Freq: Two times a day (BID) | ORAL | Status: DC
  Administered 2020-07-26: 200 mg via ORAL
  Filled 2020-07-26 (×2): qty 1

## 2020-07-26 MED ORDER — ARIPIPRAZOLE 5 MG PO TABS
30.0000 mg | ORAL_TABLET | Freq: Every day | ORAL | Status: DC
  Administered 2020-07-26: 30 mg via ORAL
  Filled 2020-07-26: qty 6

## 2020-07-26 MED ORDER — ESCITALOPRAM OXALATE 10 MG PO TABS
10.0000 mg | ORAL_TABLET | Freq: Every day | ORAL | Status: DC
  Administered 2020-07-26: 10 mg via ORAL
  Filled 2020-07-26: qty 1

## 2020-07-26 MED ORDER — ATORVASTATIN CALCIUM 40 MG PO TABS
40.0000 mg | ORAL_TABLET | Freq: Every day | ORAL | Status: DC
  Administered 2020-07-26: 40 mg via ORAL
  Filled 2020-07-26: qty 1

## 2020-07-26 MED ORDER — OXYCODONE-ACETAMINOPHEN 5-325 MG PO TABS
1.0000 | ORAL_TABLET | Freq: Four times a day (QID) | ORAL | Status: DC | PRN
  Administered 2020-07-26: 1 via ORAL
  Filled 2020-07-26: qty 1

## 2020-07-26 MED ORDER — ALBUTEROL SULFATE HFA 108 (90 BASE) MCG/ACT IN AERS: 2.0000 | INHALATION_SPRAY | Freq: Four times a day (QID) | RESPIRATORY_TRACT | Status: DC | PRN

## 2020-07-26 NOTE — ED Notes (Signed)
[facility] staff to ED to transport patient to SNF. Personal belongings sent with patient/husband.

## 2020-07-26 NOTE — ED Provider Notes (Signed)
Patient has been accepted to [facility] facility.  Pending Covid testing which has been ordered.  Should have results of that in about 2 hours.  As per my note yesterday patient presented with an acute exacerbation of her chronic back pain.  We improved her pain but patient was still unable to ambulate.  And based on this required facility placement.  MRI of the back was repeated without any acute changes from MRI in November.  Also discussed the situation with on-call neurosurgery due to the very small lesion or mass present at L2.  They felt it was inconsequential.  And the fact that it had not changed since November no concerns for neoplastic process.  In addition patient is followed by [facility] for chronic pain control.  Patient has been having difficulty controlling her pain.  Yesterday when she came in we thought that since they had dropped her down just to oral Dilaudid that this was the reason why her pain was worse.  But we were not able to get her pain under control.  When we got her up to ambulate she could not really stand at all normally she walks with a walker at home.  In addition when patient presented she was tachycardic.  She had not had her morning beta-blocker or calcium channel blocker that she normally takes for atrial fibrillation.  Both of those were given and her heart rate became controlled.  Just this morning I ordered all her normal meds.  They had not done that overnight.  Labs without any significant abnormalities there was a slight leukocytosis with a white blood cell count of 13.2.  Patient does have a history of diabetes and her glucose was 285.  But otherwise no significant abnormalities.  Results for orders placed or performed during the hospital encounter of 07/25/20  CBC with Differential/[facility]  Result Value Ref Range   WBC 13.2 (H) 4.0 - 10.5 K/uL   RBC 4.80 3.87 - 5.11 MIL/uL   Hemoglobin 14.4 12.0 - 15.0 g/dL   HCT 43.3 36.0 - 46.0 %    MCV 90.2 80.0 - 100.0 fL   MCH 30.0 26.0 - 34.0 pg   MCHC 33.3 30.0 - 36.0 g/dL   RDW 12.9 11.5 - 15.5 %   Platelets 173 150 - 400 K/uL   nRBC 0.0 0.0 - 0.2 %   Neutrophils Relative % 74 %   Neutro Abs 9.6 (H) 1.7 - 7.7 K/uL   Lymphocytes Relative 18 %   Lymphs Abs 2.4 0.7 - 4.0 K/uL   Monocytes Relative 8 %   Monocytes Absolute 1.1 (H) 0.1 - 1.0 K/uL   Eosinophils Relative 0 %   Eosinophils Absolute 0.0 0.0 - 0.5 K/uL   Basophils Relative 0 %   Basophils Absolute 0.1 0.0 - 0.1 K/uL   Immature Granulocytes 0 %   Abs Immature Granulocytes 0.05 0.00 - 0.07 K/uL  Comprehensive metabolic panel  Result Value Ref Range   Sodium 138 135 - 145 mmol/L   Potassium 3.7 3.5 - 5.1 mmol/L   Chloride 109 98 - 111 mmol/L   CO2 22 22 - 32 mmol/L   Glucose, Bld 285 (H) 70 - 99 mg/dL   BUN 21 8 - 23 mg/dL   Creatinine, Ser 0.89 0.44 - 1.00 mg/dL   Calcium 9.2 8.9 - 10.3 mg/dL   Total Protein 6.8 6.5 - 8.1 g/dL   Albumin 3.5 3.5 - 5.0 g/dL   AST 15 15 - 41  U/L   ALT 15 0 - 44 U/L   Alkaline Phosphatase 93 38 - 126 U/L   Total Bilirubin 0.9 0.3 - 1.2 mg/dL   GFR, Estimated >60 >60 mL/min   Anion gap 7 5 - 15  CBG monitoring, ED  Result Value Ref Range   Glucose-Capillary 254 (H) 70 - 99 mg/dL   Results for orders placed or performed during the hospital encounter of 07/25/20  CBC with Differential/Platelet  Result Value Ref Range   WBC 13.2 (H) 4.0 - 10.5 K/uL   RBC 4.80 3.87 - 5.11 MIL/uL   Hemoglobin 14.4 12.0 - 15.0 g/dL   HCT 43.3 36.0 - 46.0 %   MCV 90.2 80.0 - 100.0 fL   MCH 30.0 26.0 - 34.0 pg   MCHC 33.3 30.0 - 36.0 g/dL   RDW 12.9 11.5 - 15.5 %   Platelets 173 150 - 400 K/uL   nRBC 0.0 0.0 - 0.2 %   Neutrophils Relative % 74 %   Neutro Abs 9.6 (H) 1.7 - 7.7 K/uL   Lymphocytes Relative 18 %   Lymphs Abs 2.4 0.7 - 4.0 K/uL   Monocytes Relative 8 %   Monocytes Absolute 1.1 (H) 0.1 - 1.0 K/uL   Eosinophils Relative 0 %   Eosinophils Absolute 0.0 0.0 - 0.5 K/uL   Basophils  Relative 0 %   Basophils Absolute 0.1 0.0 - 0.1 K/uL   Immature Granulocytes 0 %   Abs Immature Granulocytes 0.05 0.00 - 0.07 K/uL  Comprehensive metabolic panel  Result Value Ref Range   Sodium 138 135 - 145 mmol/L   Potassium 3.7 3.5 - 5.1 mmol/L   Chloride 109 98 - 111 mmol/L   CO2 22 22 - 32 mmol/L   Glucose, Bld 285 (H) 70 - 99 mg/dL   BUN 21 8 - 23 mg/dL   Creatinine, Ser 0.89 0.44 - 1.00 mg/dL   Calcium 9.2 8.9 - 10.3 mg/dL   Total Protein 6.8 6.5 - 8.1 g/dL   Albumin 3.5 3.5 - 5.0 g/dL   AST 15 15 - 41 U/L   ALT 15 0 - 44 U/L   Alkaline Phosphatase 93 38 - 126 U/L   Total Bilirubin 0.9 0.3 - 1.2 mg/dL   GFR, Estimated >60 >60 mL/min   Anion gap 7 5 - 15  CBG monitoring, ED  Result Value Ref Range   Glucose-Capillary 254 (H) 70 - 99 mg/dL   MR Lumbar Spine Wo Contrast  Result Date: 07/25/2020 CLINICAL DATA:  Lumbosacral radiculopathy.  Chronic low back pain EXAM: MRI LUMBAR SPINE WITHOUT CONTRAST TECHNIQUE: Multiplanar, multisequence MR imaging of the lumbar spine was performed. No intravenous contrast was administered. COMPARISON:  Lumbar MRI 04/23/2020, 03/27/2019 FINDINGS: Segmentation:  Normal Alignment:  Slight retrolisthesis L2-3 and L4-5 Vertebrae: Chronic fractures superior endplates of W43 and X54 unchanged from prior study. No acute fracture or skeletal lesion. Conus medullaris and cauda equina: Conus extends to the L1-2 level. Conus medullaris normal. 3 x 5 mm lesion in the dorsal spinal canal at the L2 level unchanged from prior studies. This is best seen on sagittal T2 images and difficult to see on axial images. This appears intradural. Paraspinal and other soft tissues: Negative for paraspinous mass or adenopathy. Disc levels: L1-2: Shallow right paracentral disc protrusion, unchanged from the prior study. No significant stenosis. L2-3: Mild disc bulging and mild facet degeneration. Small annular fissure centrally. No disc protrusion or stenosis L3-4: Disc  degeneration with mild disc bulging.  Mild facet degeneration. Negative for stenosis L4-5: Disc degeneration with diffuse disc bulging and bilateral facet degeneration. Asymmetric disc bulging and spurring on the left with moderate subarticular stenosis on the left. No interval change from the prior study. L5-S1: Mild facet degeneration.  Negative for stenosis. IMPRESSION: 3 x 5 mm nodule in the dorsal spinal canal at the L2 level likely intramural. No change from prior studies. 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 Multilevel degenerative changes, stable from the prior study. Electronically Signed   By: Franchot Gallo M.D.   On: 07/25/2020 12:31    Physical exam was significant for irregular rate to the heart and tachycardia.  Lungs were clear bilaterally.  Patient with bilateral low back pain to palpation.  Abdomen was soft and nontender.  Patient able to move both legs.  But very limited secondary to pain.  Upper extremity neuro exam was normal.  Patient also had pain radiating to the lateral aspect of her right foot.  Consistent with a some degree of sciatica.  But as stated MRI showed no significant nerve impingement in the sciatic nerve distribution coming out of the back.   Patient evaluated by physical therapy social worker also involved for nursing home placement.  Patient unable to ambulate therefore unable to be taking care of at home.  Nursing will print her list of medications.     Fredia Sorrow, MD 07/26/20 [id number]

## 2020-07-26 NOTE — TOC Transition Note (Signed)
Transition of Care [facility]) - CM/SW Discharge Note  Patient Details  Name: Amber Stephenson MRN: [id number] Date of Birth: 19-Mar-1946  [facility]) CM/SW Contact:  Sherie Don, LCSW Phone Number: 07/26/2020, 9:16 AM  Clinical Narrative: Patient's PASRR has resulted: [id number]. CSW spoke with Marianna Fuss at [facility] and confirmed patient can come today pending a negative COVID test and EDP note. COVID test is negative. EDP note, COVID test results, and AVS faxed to [facility]. Patient will go to room 134 and the number to call for report is [phone]. Patient's husband updated on placement. TOC signing off.  Final next level of care: Skilled Nursing Facility Barriers to Discharge: Barriers Resolved  Patient Goals and CMS Choice Patient states their goals for this hospitalization and ongoing recovery are:: Go to [facility] for rehab CMS Medicare.gov Compare Post Acute Care list provided to:: Patient Choice offered to / list presented to : Patient  Discharge Placement PASRR number recieved: 07/26/20         Patient [facility] bed at: [facility] Name of family member notified: Amber Stephenson (husband) Patient and family notified of of transfer: 07/26/20  Discharge Plan and Services In-house Referral: Clinical Social Work Discharge Planning Services: [employer] Consult Post Acute Care Choice: [place]          DME Arranged: N/A DME Agency: NA HH Arranged: NA [place] Agency: NA  Readmission Risk Interventions No flowsheet data found.

## 2020-07-27 ENCOUNTER — Other Ambulatory Visit: Payer: Self-pay | Admitting: Adult Health

## 2020-07-27 ENCOUNTER — Encounter: Payer: Self-pay | Admitting: Adult Health

## 2020-07-27 ENCOUNTER — Non-Acute Institutional Stay (SKILLED_NURSING_FACILITY): Payer: Medicare Other | Admitting: Adult Health

## 2020-07-27 DIAGNOSIS — J4520 Mild intermittent asthma, uncomplicated: Secondary | ICD-10-CM

## 2020-07-27 DIAGNOSIS — M7918 Myalgia, other site: Secondary | ICD-10-CM | POA: Diagnosis not present

## 2020-07-27 DIAGNOSIS — J9611 Chronic respiratory failure with hypoxia: Secondary | ICD-10-CM

## 2020-07-27 DIAGNOSIS — I5032 Chronic diastolic (congestive) heart failure: Secondary | ICD-10-CM

## 2020-07-27 DIAGNOSIS — I482 Chronic atrial fibrillation, unspecified: Secondary | ICD-10-CM

## 2020-07-27 DIAGNOSIS — F339 Major depressive disorder, recurrent, unspecified: Secondary | ICD-10-CM

## 2020-07-27 DIAGNOSIS — G894 Chronic pain syndrome: Secondary | ICD-10-CM | POA: Diagnosis not present

## 2020-07-27 DIAGNOSIS — E1142 Type 2 diabetes mellitus with diabetic polyneuropathy: Secondary | ICD-10-CM

## 2020-07-27 DIAGNOSIS — E1159 Type 2 diabetes mellitus with other circulatory complications: Secondary | ICD-10-CM | POA: Diagnosis not present

## 2020-07-27 DIAGNOSIS — IMO0002 Reserved for concepts with insufficient information to code with codable children: Secondary | ICD-10-CM

## 2020-07-27 DIAGNOSIS — K5909 Other constipation: Secondary | ICD-10-CM

## 2020-07-27 DIAGNOSIS — E1143 Type 2 diabetes mellitus with diabetic autonomic (poly)neuropathy: Secondary | ICD-10-CM | POA: Diagnosis not present

## 2020-07-27 DIAGNOSIS — E785 Hyperlipidemia, unspecified: Secondary | ICD-10-CM

## 2020-07-27 DIAGNOSIS — E1165 Type 2 diabetes mellitus with hyperglycemia: Secondary | ICD-10-CM

## 2020-07-27 DIAGNOSIS — H8113 Benign paroxysmal vertigo, bilateral: Secondary | ICD-10-CM | POA: Diagnosis not present

## 2020-07-27 DIAGNOSIS — I152 Hypertension secondary to endocrine disorders: Secondary | ICD-10-CM

## 2020-07-27 DIAGNOSIS — E1169 Type 2 diabetes mellitus with other specified complication: Secondary | ICD-10-CM | POA: Diagnosis not present

## 2020-07-27 MED ORDER — PREGABALIN 200 MG PO CAPS: 200.0000 mg | ORAL_CAPSULE | Freq: Two times a day (BID) | ORAL | 0 refills | Status: DC

## 2020-07-27 MED ORDER — LORAZEPAM 1 MG PO TABS
1.0000 mg | ORAL_TABLET | ORAL | 0 refills | Status: DC
Start: 2020-07-27 — End: 2020-08-09

## 2020-07-27 MED ORDER — HYDROMORPHONE HCL 2 MG PO TABS: 2.0000 mg | ORAL_TABLET | Freq: Four times a day (QID) | ORAL | 0 refills | Status: DC | PRN

## 2020-07-27 NOTE — Progress Notes (Signed)
Location:  [facility] Room Number: 132 Place of Service:  SNF (31)   CODE STATUS: full   Allergies  Allergen Reactions  . Ivp Dye [Iodinated Diagnostic Agents] Anaphylaxis and Swelling    Throat closes   . Latex     Latex tape pulls skin with it    Chief Complaint  Patient presents with  . Acute Visit    Follow up ED visit and transfer     HPI:  She is a 75 year old woman who presented to the ED on 07-25-20. She has a history of chronic pain; diabetes; chronic respiratory failure with hypoxia.  She is being followed by pain clinic for her pain management. Her pain had become unmanageable and she had increased difficulty with self care. She is unable to ambulate. Her pain is now better managed. She is presently taking dilaudid; lyrica for her pain management. She denies any uncontrolled pain at this time. She is tired today. She states that her appetite is good. Her goal at this time is to return home. She will continue to be followed for her chronic illnesses including: Myofascial pain dysfunction syndrome/chronic pain syndrome:  Chronic diastolic heart failure: Hypertension associated with type 2 diabetes mellitus  Past Medical History:  Diagnosis Date  . Acute on chronic respiratory failure with hypoxia ([place]) 01/22/2019  . Atrial fibrillation with RVR 05/19/2017  . Bipolar I disorder 01/23/2015  . CAD (coronary artery disease)    Nonobstructive; Managed by Dr. Bronson Ing  . Cardiomegaly 01/12/2018  . Chronic anticoagulation 05/30/2015  . Chronic back pain    Lower back  . Chronic diastolic CHF (congestive heart failure) 05/30/2015  . Diabetes mellitus, type 2, without complication   . Diabetic neuropathy 02/06/2016  . Dysrhythmia    A-Fib  . Essential hypertension   . Herpes genitalis in women 07/16/2015  . Hyperlipidemia   . Hypertension   . Hypoxia 02/01/2019  . Insomnia 01/23/2015  . Lipoma 02/08/2015  . Major depressive disorder   . Migraine  headache with aura 02/12/2016  . 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[place]) 04/21/2019  . NAFL (nonalcoholic fatty liver) 8/33/8250  . OSA (obstructive sleep apnea) 02/24/2019   10/09/2018 - HST  - AHI 40.6   . Pulmonary hypertension   . Renal insufficiency    Managed by Dr. Wallace Keller  . RLS (restless legs syndrome) 04/27/2015    Past Surgical History:  Procedure Laterality Date  . BREAST REDUCTION SURGERY    . EYE SURGERY Right    cateracts  . HAMMER TOE SURGERY    . LEFT HEART CATH AND CORONARY ANGIOGRAPHY N/A 02/03/2018   Procedure: LEFT HEART CATH AND CORONARY ANGIOGRAPHY;  Surgeon: Martinique, Peter M, MD;  Location: [facility];  Service: Cardiovascular;  Laterality: N/A;  . REVERSE SHOULDER ARTHROPLASTY Right 08/19/2019   Procedure: REVERSE SHOULDER ARTHROPLASTY;  Surgeon: Netta Cedars, MD;  Location: [facility];  Service: Orthopedics;  Laterality: Right;  interscalene block  . SHOULDER SURGERY Right    "I BROKE MY SHOUDLER  . THIGH SURGERY     "TO REMOVE A TUMOR "    Social History   Socioeconomic History  . Marital status: Married    Spouse name: Orpah Greek  . Number of children: 3  . Years of education: 53  . Highest education level: Bachelor's degree (e.g., BA, AB, BS)  Occupational History  . Occupation: Retired    Comment: marketing  Tobacco Use  . Smoking status: Never Smoker  .  Smokeless tobacco: Never Used  Vaping Use  . Vaping Use: Never used  Substance and Sexual Activity  . Alcohol use: Not Currently    Alcohol/week: 0.0 standard drinks  . Drug use: No  . Sexual activity: Yes    Partners: Male    Birth control/protection: Post-menopausal  Other Topics Concern  . Not on file  Social History Narrative   Lives at home with husband.       Right handed   Social Determinants of Health   Financial Resource Strain: Not on file  Food Insecurity: Not on file  Transportation Needs: Not on file  Physical Activity: Not on file  Stress: Not on file  Social  Connections: Not on file  Intimate Partner Violence: Not on file   Family History  Problem Relation Age of Onset  . Diabetes Mother   . Heart disease Mother   . Heart disease Brother   . Heart disease Sister        CABG  . Diabetes Sister   . Alzheimer's disease Father   . Alcohol abuse Sister   . Mental illness Brother   . Diabetes Brother       VITAL SIGNS BP 108/70   Pulse 78   Temp 98.1 F (36.7 C)   Ht 5\' 2"  (1.575 m)   Wt 183 lb 11.2 oz (83.3 kg)   BMI 33.60 kg/m   Facility-Administered Encounter Medications as of 07/27/2020  Medication  . bupivacaine-epinephrine (MARCAINE W/ EPI) 0.5% -1:200000 injection   Outpatient Encounter Medications as of 07/27/2020  Medication Sig Note  . acetaminophen (TYLENOL) 325 MG tablet Take 2 tablets (650 mg total) by mouth every 6 (six) hours as needed for mild pain, fever or headache (or Fever >/= 101).   Marland Kitchen albuterol (PROVENTIL) (2.5 MG/3ML) 0.083% nebulizer solution Take 3 mLs (2.5 mg total) by nebulization every 6 (six) hours as needed for wheezing or shortness of breath.   Marland Kitchen albuterol (VENTOLIN HFA) 108 (90 Base) MCG/ACT inhaler Inhale 2 puffs into the lungs every 6 (six) hours as needed for wheezing or shortness of breath.   . ARIPiprazole (ABILIFY) 10 MG tablet Take 3 tablets (30 mg total) by mouth daily.   Marland Kitchen atorvastatin (LIPITOR) 40 MG tablet Take 1 tablet (40 mg total) by mouth daily.   Marland Kitchen diltiazem (CARDIZEM CD) 120 MG 24 hr capsule Take 1 capsule (120 mg total) by mouth at bedtime.   Marland Kitchen ELIQUIS 5 MG TABS tablet TAKE 1 TABLET TWICE A DAY 07/25/2020: Do not know a time.  Marland Kitchen escitalopram (LEXAPRO) 10 MG tablet Take 1 tablet (10 mg total) by mouth daily.   . Fluticasone-Salmeterol (ADVAIR DISKUS) 500-50 MCG/DOSE AEPB Inhale 1 puff into the lungs in the morning and at bedtime.   . furosemide (LASIX) 40 MG tablet TAKE 1 TABLET EVERY MORNING   . HYDROmorphone (DILAUDID) 2 MG tablet Take 1 tablet (2 mg total) by mouth every 6 (six)  hours as needed for severe pain.   Marland Kitchen insulin glargine (LANTUS SOLOSTAR) 100 UNIT/ML Solostar Pen Inject 50 Units into the skin 2 (two) times daily.   Marland Kitchen LORazepam (ATIVAN) 1 MG tablet Take 1-2 tablets (1-2 mg total) by mouth See admin instructions. 1 mg QAM and 2 mg QHS   . meclizine (ANTIVERT) 12.5 MG tablet Take 12.5 mg by mouth 3 (three) times daily as needed for dizziness.   . methocarbamol (ROBAXIN) 500 MG tablet Take 500 mg by mouth 3 (three) times daily.   Marland Kitchen  metoprolol succinate (TOPROL-XL) 100 MG 24 hr tablet Take 100 mg by mouth in the morning and at bedtime. 07/25/2020: Takes both 25 mg and 100 mg twice a day per husband.  . metoprolol tartrate (LOPRESSOR) 25 MG tablet Take 1 tablet (25 mg total) by mouth 2 (two) times daily. 07/25/2020: Do not know time of dose  . Multiple Vitamin (MULTIVITAMIN WITH MINERALS) TABS tablet Take 1 tablet by mouth daily.   . NON FORMULARY Diet: _____ Regular, ___x___ NAS, ___x____Consistent Carbohydrate, _______NPO _____Other   . polyethylene glycol (MIRALAX / GLYCOLAX) 17 g packet Take 17 g by mouth daily.   . potassium chloride SA (KLOR-CON) 20 MEQ tablet Take 1 tablet (20 mEq total) by mouth daily.   . RYBELSUS 7 MG TABS Take 7 mg by mouth daily at 12 noon.   . traZODone (DESYREL) 100 MG tablet Take 100 mg by mouth at bedtime as needed for sleep.   Marland Kitchen VASCEPA 1 g capsule TAKE 2 CAPSULES TWICE A DAY WITH MEALS (Patient taking differently: Take 2 g by mouth 2 (two) times daily.)   . metFORMIN (GLUCOPHAGE) 500 MG tablet Take 500 mg by mouth daily with breakfast. (Patient not taking: Reported on 07/25/2020)      SIGNIFICANT DIAGNOSTIC EXAMS   PREVIOUS   04-23-20: ct of head:  Minimal periventricular small vessel disease. No acute infarct. No mass or hemorrhage. Foci of arterial vascular calcification noted. Mucosal thickening noted in several ethmoid air cells.  04-23-20: mri of lumbar spine:  1. Small (5 mm) round mass within the dorsal spinal canal  at L2, which is similar versus slightly increased in size compared to prior. 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 2. Similar multilevel degenerative change without significant central canal stenosis. Similar mild left foraminal stenosis at L4-L5.  04-23-20: mri of brain:  No acute infarction, hemorrhage, hydrocephalus, extra-axial collection or mass lesion. Scattered T2/FLAIR hyperintensities within the white matter, compatible with chronic microvascular ischemic disease and appropriate for patient's age. Mild diffuse cerebral atrophy  04-23-20 mri cervical spine:  No evidence of cervical spine infection. Multilevel spondylosis. Patent spinal canal. Mild left C6-7 neural foraminal narrowing. Central/paracentral protrusions at the C4-5 and C5-6 level abutting the ventral cord. No evidence of myelomalacia.  04-22-20: mri thoracic spine:  No evidence of infection. Mild degenerative changes and chronic superior T11 endplate deformity with minimal height loss. Shallow left T10-11 paracentral protrusion. Patent spinal canal and neural foramen.  TODAY  07-25-20: mri lumbar spine:  3 x 5 mm nodule in the dorsal spinal canal at the L2 level likely intramural. No change from prior studies. 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 Multilevel degenerative changes, stable from the prior study.   LABS REVIEWED PREVIOUS    08-29-19: chol 128; ldl 71; trig 249; hdl 42 04-23-20: wbc 12.3; hgb 14.5; hct 41.9 mcv 90.1 plt 218; glucose 612; bun 35; creat 1.09; k+ 4.3; na++ 130; ca 9.4 liver normal albumin 3.9 hgb a1c 10.9 04-24-20: wbc 11.2; hgb 13.5; hct 40.0; mcv 91.1 plt 188; glucose 227; bun 27; creat 0.75; k+ 3.3; na++ 134; ca 8.4  04-30-20: k+ 3.9.   TODAY  07-25-20: wbc 11.2; hgb 13.5; hct 40.0; mcv 91.1 plt 188; glucose 285; bun 22; creat 0.92; k+ 3.7; na++ 138; ca 9.3 GFR >60; liver normal albumin 4.3  Review of Systems   Constitutional: Negative for malaise/fatigue.  Respiratory: Negative for cough and shortness of breath.   Cardiovascular: Negative for chest  pain, palpitations and leg swelling.  Gastrointestinal: Negative for abdominal pain, constipation and heartburn.  Musculoskeletal: Positive for back pain and myalgias. Negative for joint pain.  Skin: Negative.   Neurological: Negative for dizziness.  Psychiatric/Behavioral: The patient is not nervous/anxious.     Physical Exam Constitutional:      General: She is not in acute distress.    Appearance: She is well-developed and well-nourished. She is obese. She is not diaphoretic.  Neck:     Thyroid: No thyromegaly.  Cardiovascular:     Rate and Rhythm: Tachycardia present. Rhythm irregular.     Pulses: Normal pulses and intact distal pulses.     Heart sounds: Normal heart sounds.  Pulmonary:     Effort: Pulmonary effort is normal. No respiratory distress.     Breath sounds: Normal breath sounds.  Abdominal:     General: Bowel sounds are normal. There is no distension.     Palpations: Abdomen is soft.     Tenderness: There is no abdominal tenderness.  Musculoskeletal:        General: No edema.     Cervical back: Neck supple.     Right lower leg: No edema.     Left lower leg: No edema.     Comments: Is able to move all extremities   Lymphadenopathy:     Cervical: No cervical adenopathy.  Skin:    General: Skin is warm and dry.  Neurological:     Mental Status: She is alert and oriented to person, place, and time.  Psychiatric:        Mood and Affect: Mood and affect and mood normal.      ASSESSMENT/ PLAN:  TODAY  1. Myofascial pain dysfunction syndrome/chronic pain syndrome: her pain is presently being managed with the current regimen of: dilaudid 2 mg every 6 hours as needed; lyrica 200 mg twice daily has robaxin 500 mg three times daily as needed will monitor her status.   2. Chronic diastolic heart failure: is stable will  continue lasix 40 mg daily with k+ 20 meq daily   3. Hypertension associated with type 2 diabetes mellitus: is stable b/p 108/70: will continue toprol xl 100 mg twice daily with lopressor 25 mg twice daily will monitor her status.   4. Chronic atrial fibrillation: heart rate is stable: will continue toprol xl 100 mg twice daily with lopressor 25 mg twice daily; diltiazem er 120 mg daily takes eliquis 5 mg twice daily   5. Chronic respiratory failure with hypoxia/mild intermittent asthma uncomplicated : is stable will continue advair 500/50 1 puff twice daily has albuterol neb and inhaler every 6 hours as needed  6. Uncontrolled type 2 diabetes mellitus with peripheral autonomic neuropathy : hgb a1c 10.9; at this time will continue lantus 50 units twice daily rybelsus 7 mg daily metformin 500 mg daily   7. Hyperlipidemia associated with type 2 diabetes mellitus: is stable will continue lipitor 40 mg daily vascepa 2 gm twice daily   8. Diabetic polyneuropathy associated with type 2 diabetes mellitus: is stable will continue lyrica 200 mg twice daily   9. Benign paroxysmal positional vertigo due to bilateral vestibular disorder: is stable will continue meclizine 12.5 mg three times daily as needed  10. Chronic constipation: is stable will continue miralax daily   11. Depression recurrent: is stable will continue abilify 30 mg daily lexapro 10 mg daily ativan 1 mg in the AM and 2 mg in the PM has trazodone 100 mg nightly  as needed    Will check hgb a1c    MD is aware of resident's narcotic use and is in agreement with current plan of care. We will attempt to wean resident as appropriate.  Ok Edwards NP [facility]  Contact [phone] Monday through Friday 8am- 5pm  After hours call [phone]

## 2020-08-01 ENCOUNTER — Non-Acute Institutional Stay (SKILLED_NURSING_FACILITY): Payer: Medicare Other | Admitting: Internal Medicine

## 2020-08-01 ENCOUNTER — Encounter: Payer: Self-pay | Admitting: Internal Medicine

## 2020-08-01 DIAGNOSIS — I482 Chronic atrial fibrillation, unspecified: Secondary | ICD-10-CM

## 2020-08-01 DIAGNOSIS — M5441 Lumbago with sciatica, right side: Secondary | ICD-10-CM | POA: Diagnosis not present

## 2020-08-01 DIAGNOSIS — E1143 Type 2 diabetes mellitus with diabetic autonomic (poly)neuropathy: Secondary | ICD-10-CM

## 2020-08-01 DIAGNOSIS — E1165 Type 2 diabetes mellitus with hyperglycemia: Secondary | ICD-10-CM | POA: Diagnosis not present

## 2020-08-01 DIAGNOSIS — E662 Morbid (severe) obesity with alveolar hypoventilation: Secondary | ICD-10-CM

## 2020-08-01 DIAGNOSIS — Z9189 Other specified personal risk factors, not elsewhere classified: Secondary | ICD-10-CM | POA: Diagnosis not present

## 2020-08-01 DIAGNOSIS — G8929 Other chronic pain: Secondary | ICD-10-CM | POA: Diagnosis not present

## 2020-08-01 DIAGNOSIS — M79671 Pain in right foot: Secondary | ICD-10-CM | POA: Diagnosis not present

## 2020-08-01 DIAGNOSIS — M5442 Lumbago with sciatica, left side: Secondary | ICD-10-CM | POA: Diagnosis not present

## 2020-08-01 DIAGNOSIS — IMO0002 Reserved for concepts with insufficient information to code with codable children: Secondary | ICD-10-CM

## 2020-08-01 NOTE — Assessment & Plan Note (Signed)
Rate adequately controlled on present regimen.  No change indicated in therapy.

## 2020-08-01 NOTE — Patient Instructions (Signed)
See assessment and plan under each diagnosis in the problem list and acutely for this visit 

## 2020-08-01 NOTE — Assessment & Plan Note (Signed)
Today she denies having obstructive sleep apnea.  Her husband states that she does not snore or exhibit apnea at home.

## 2020-08-01 NOTE — Assessment & Plan Note (Addendum)
Neurosurgery consulted by phone 2/23 ; no intervention felt to be necessary as the lesion had not changed on MRI since November. She describes the pain as "almost 24/7" sharp, nonradiating.  Is not associated with bowel or urinary incontinence. Today negative SLR on exam. Pain regimen is associated with extremely high risk because of the multiple comorbidities.

## 2020-08-01 NOTE — Progress Notes (Signed)
NURSING HOME LOCATION: [facility] ROOM NUMBER:  [id number]  CODE STATUS:  Full Code  PCP:  Leonie Green MD  This is a comprehensive admission note to [facility] performed on this date less than 30 days from date of admission. Included are preadmission medical/surgical history; reconciled medication list; family history; social history and comprehensive review of systems.  Corrections and additions to the records were documented. Comprehensive physical exam was also performed. Additionally a clinical summary was entered for each active diagnosis pertinent to this admission in the Problem List to enhance continuity of care.  HPI: Patient presented to the ED 07/25/2020 with acute exacerbation of chronic back pain.  Dr. Patrice Paradise in [place] follows her for chronic pain control. Pain control improved with medications in the ED but she was still nonambulatory necessitating SNF placement for STIR.  PTA she had been ambulating with a walker at home. MRI of the back revealed no acute changes from an MRI in November.  Neurosurgeryl was consulted by phone concerning the small lesion or mass present at L2.  It was their opinion that this was inconsequential and as it had not changed since November suggested absence of neoplastic process.   In the ED she did demonstrate tachycardia; this was in the context of not having received her beta-blocker or calcium channel blocker that day.  These were prescribed for rate control of atrial fibrillation.  CBC revealed slight leukocytosis with a white count of 13,200.  Random glucose was 285.  Most recent A1c was 10.9% on 04/23/2020. She was discharged to [facility] with plan for rehab.  Past medical and surgical history: Includes RLS, history of renal insufficiency, OSA (she & husband deny this), pulmonary hypertension, NASH, history of migraine with aura, major depressive disorder, essential hypertension, dyslipidemia, bipolar disorder, CAD,  and history of acute on chronic respiratory failure ( also denied). Surgeries and procedures include breast reduction surgery, cardiac cath and angiography, and shoulder surgery.  Social history: Currently nondrinker; never smoked.  Family history: Extensive family history reviewed; strongly positive for diabetes and heart disease.   Review of systems: She describes the back pain as sharp and "almost 24/7".  It is nonradiating and not associated with stool or urinary incontinence. She also describes pain, swelling and redness along the lateral aspect of the right foot present upon awakening 1 week ago.  It was so painful that she could not ambulate. She denies hx of gout. No films were made in the ED. She denies having obstructive sleep apnea and her husband denies loud snoring or apnea at home. She & her husband validate that she had acute renal insufficiency several years ago and is followed by Dr. Jacquenette [facility].  They do not know the etiology of the AKI and did not realize it had resolved based on recent chemistries. Creatinine was 0.89 & GFR > 60 on 2/23.  Constitutional: No fever, significant weight change, fatigue  Eyes: No redness, discharge, pain, vision change ENT/mouth: No nasal congestion, purulent discharge, earache, change in hearing, sore throat  Cardiovascular: No chest pain, palpitations, paroxysmal nocturnal dyspnea, claudication, edema  Respiratory: No cough, sputum production, hemoptysis, DOE Gastrointestinal: No heartburn, dysphagia, abdominal pain, nausea /vomiting, rectal bleeding, melena, change in bowels Genitourinary: No dysuria, hematuria, pyuria, nocturia Musculoskeletal: No joint stiffness, joint swelling Neurologic: No dizziness, syncope, seizures, numbness, tingling Psychiatric: No significant  insomnia, anorexia Endocrine: No change in hair/skin/nails, excessive thirst, excessive hunger, excessive urination  Hematologic/lymphatic: No significant  bruising, lymphadenopathy, abnormal bleeding Allergy/immunology: No itchy/watery eyes, significant sneezing, urticaria, angioedema  Physical exam:  Pertinent or positive findings: Her responses tend to be flat.  For example as she discusses severe pain in her back as "very very bad" her expression does not change.  The tear ducts are prominent, especially the right.  Dental hygiene is excellent.  Heart rhythm is slightly irregular, but rate is not accelerated.  Is there is faint erythema over the mid right lateral foot which is tender to palpation.  She has varus deformity of the knees.  There is slight crepitus of the knees.  With elevation of the leg there is some discomfort but there is no classic radicular pain down the back of the legs.  General appearance: Adequately nourished; no acute distress, increased work of breathing is present.   Lymphatic: No lymphadenopathy about the head, neck, axilla. Eyes: No conjunctival inflammation or lid edema is present. There is no scleral icterus. Ears:  External ear exam shows no significant lesions or deformities.   Nose:  External nasal examination shows no deformity or inflammation. Nasal mucosa are pink and moist without lesions, exudates Oral exam: Lips and gums are healthy appearing.There is no oropharyngeal erythema or exudate. Neck:  No thyromegaly, masses, tenderness noted.    Heart:  No gallop, murmur, click, rub.  Lungs:without wheezes, rhonchi, rales, rubs. Abdomen: Bowel sounds are normal.  Abdomen is soft and nontender with no organomegaly, hernias, masses. GU: Deferred  Extremities:  No cyanosis, clubbing, edema. Neurologic exam:  Balance, Rhomberg, finger to nose testing could not be completed due to clinical state Skin: Warm & dry w/o tenting. No significant lesions . R lateral foot skin changes as noted.  See clinical summary under each active problem in the Problem List with associated updated therapeutic plan

## 2020-08-01 NOTE — Assessment & Plan Note (Addendum)
A1c of 10.9% on 04/23/2020 represents the most recent value.  Glucose was 285 in the ED.  Here at the facility glucoses range from 198 up to 394. She realizes that she is having intermittent significant hyperglycemia.  She is requesting reinitiation of the Metformin.  Her renal function was normal in the ED.  Restart Metformin 500 mg after breakfast and evening meal.

## 2020-08-01 NOTE — Assessment & Plan Note (Addendum)
I asked her to clarify with her PCP whether she indeed has had AKI, OSA, and acute on chronic respiratory failure because of the potential risk with long-term opioids if these conditions are indeed present.

## 2020-08-02 ENCOUNTER — Other Ambulatory Visit: Payer: Self-pay | Admitting: Adult Health

## 2020-08-02 ENCOUNTER — Other Ambulatory Visit (HOSPITAL_COMMUNITY)
Admission: RE | Admit: 2020-08-02 | Discharge: 2020-08-02 | Disposition: A | Discharge status: Home / Self Care | Payer: Medicare Other | Source: Skilled Nursing Facility | Attending: Internal Medicine | Admitting: Internal Medicine

## 2020-08-02 ENCOUNTER — Encounter: Payer: Self-pay | Admitting: Internal Medicine

## 2020-08-02 DIAGNOSIS — M797 Fibromyalgia: Secondary | ICD-10-CM | POA: Insufficient documentation

## 2020-08-02 LAB — SEDIMENTATION RATE: Sed Rate: 28 mm/hr — ABNORMAL HIGH (ref 0–22)

## 2020-08-02 LAB — URIC ACID: Uric Acid, Serum: 7.9 mg/dL — ABNORMAL HIGH (ref 2.5–7.1)

## 2020-08-06 NOTE — Telephone Encounter (Signed)
I have attempted to call both pt and husband on home and cell- by reviewing the chart, pt is in a SNF- nursing home currently and got an Rx from SNF for her Dilaudid- I am concerned about her alcohol level and want to discuss with pt and husband, but they are not available to do so right now- If we can get in touch with husband, I would continue to try and reach out and let him know it's 1 of 2 strikes since it's legal but concerning esp at her level - I don't want her having ANY alcohol on her level of pain medicines.  That means ZERO!- thank you, ML

## 2020-08-07 ENCOUNTER — Non-Acute Institutional Stay (SKILLED_NURSING_FACILITY): Payer: Medicare Other | Admitting: Adult Health

## 2020-08-07 ENCOUNTER — Encounter: Payer: Self-pay | Admitting: Adult Health

## 2020-08-07 DIAGNOSIS — IMO0002 Reserved for concepts with insufficient information to code with codable children: Secondary | ICD-10-CM

## 2020-08-07 DIAGNOSIS — E1143 Type 2 diabetes mellitus with diabetic autonomic (poly)neuropathy: Secondary | ICD-10-CM

## 2020-08-07 DIAGNOSIS — E1165 Type 2 diabetes mellitus with hyperglycemia: Secondary | ICD-10-CM | POA: Diagnosis not present

## 2020-08-07 NOTE — Progress Notes (Signed)
Location:  [place] Room Number: 761 Place of Service:  SNF (31)   CODE STATUS: full   Allergies  Allergen Reactions  . Ivp Dye [Iodinated Diagnostic Agents] Anaphylaxis and Swelling    Throat closes   . Codeine Other (See Comments)  . Iodine Other (See Comments)  . Latex     Latex tape pulls skin with it    Chief Complaint  Patient presents with  . Acute Visit    Weight gain and diabetes     HPI:  She has gained weight from 183.7 to 196.4 pounds. She states that she is eating all the time; and a lot of her intake is high calorie. Her cbg readings are all elevated 200-300's. She is tolerating her medications without difficulty there are no reports of missed doses. She denies any excessive hunger or thirst.   Past Medical History:  Diagnosis Date  . Acute on chronic respiratory failure with hypoxia ([place]) 01/22/2019  . Atrial fibrillation with RVR 05/19/2017  . Bipolar I disorder 01/23/2015  . CAD (coronary artery disease)    Nonobstructive; Managed by Dr. Bronson Ing  . Cardiomegaly 01/12/2018  . Chronic anticoagulation 05/30/2015  . Chronic atrial fibrillation ([place]) 01/04/2015  . Chronic back pain    Lower back  . Chronic diastolic CHF (congestive heart failure) 05/30/2015  . Diabetes mellitus, type 2, without complication   . Diabetic neuropathy 02/06/2016  . Dysrhythmia    A-Fib  . Essential hypertension   . Herpes genitalis in women 07/16/2015  . Hyperlipidemia   . Hypoxia 02/01/2019  . Insomnia 01/23/2015  . Lipoma 02/08/2015  . Major depressive disorder   . Migraine headache with aura 02/12/2016  . 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[place]) 04/21/2019  . NAFL (nonalcoholic fatty liver) 11/06/3708  . OSA (obstructive sleep apnea) 02/24/2019   10/09/2018 - HST  - AHI 40.6   . Pulmonary hypertension   . Renal insufficiency    Managed by Dr. Wallace Keller  . RLS (restless legs syndrome) 04/27/2015    Past Surgical History:  Procedure Laterality  Date  . BREAST REDUCTION SURGERY    . EYE SURGERY Right    cateracts  . HAMMER TOE SURGERY    . LEFT HEART CATH AND CORONARY ANGIOGRAPHY N/A 02/03/2018   Procedure: LEFT HEART CATH AND CORONARY ANGIOGRAPHY;  Surgeon: Martinique, Peter M, MD;  Location: [facility];  Service: Cardiovascular;  Laterality: N/A;  . REVERSE SHOULDER ARTHROPLASTY Right 08/19/2019   Procedure: REVERSE SHOULDER ARTHROPLASTY;  Surgeon: Netta Cedars, MD;  Location: [facility];  Service: Orthopedics;  Laterality: Right;  interscalene block  . SHOULDER SURGERY Right    "I BROKE MY SHOUDLER  . THIGH SURGERY     "TO REMOVE A TUMOR "    Social History   Socioeconomic History  . Marital status: Married    Spouse name: Orpah Greek  . Number of children: 3  . Years of education: 73  . Highest education level: Bachelor's degree (e.g., BA, AB, BS)  Occupational History  . Occupation: Retired    Comment: marketing  Tobacco Use  . Smoking status: Never Smoker  . Smokeless tobacco: Never Used  Vaping Use  . Vaping Use: Never used  Substance and Sexual Activity  . Alcohol use: Not Currently    Alcohol/week: 0.0 standard drinks  . Drug use: No  . Sexual activity: Yes    Partners: Male    Birth control/protection: Post-menopausal  Other Topics Concern  . Not  on file  Social History Narrative   Lives at home with husband.       Right handed   Social Determinants of Health   Financial Resource Strain: Not on file  Food Insecurity: Not on file  Transportation Needs: Not on file  Physical Activity: Not on file  Stress: Not on file  Social Connections: Not on file  Intimate Partner Violence: Not on file   Family History  Problem Relation Age of Onset  . Diabetes Mother   . Heart disease Mother   . Heart disease Brother   . Heart disease Sister        CABG  . Diabetes Sister   . Alzheimer's disease Father   . Alcohol abuse Sister   . Mental illness Brother   . Diabetes Brother       VITAL SIGNS BP  131/75   Pulse 68   Temp 98.1 F (36.7 C)   Resp 20   Ht 5\' 2"  (1.575 m)   Wt 196 lb 6.4 oz (89.1 kg)   SpO2 98%   BMI 35.92 kg/m   Facility-Administered Encounter Medications as of 08/07/2020  Medication  . bupivacaine-epinephrine (MARCAINE W/ EPI) 0.5% -1:200000 injection   Outpatient Encounter Medications as of 08/07/2020  Medication Sig  . acetaminophen (TYLENOL) 325 MG tablet Take 2 tablets (650 mg total) by mouth every 6 (six) hours as needed for mild pain, fever or headache (or Fever >/= 101).  Marland Kitchen albuterol (PROVENTIL) (2.5 MG/3ML) 0.083% nebulizer solution Take 3 mLs (2.5 mg total) by nebulization every 6 (six) hours as needed for wheezing or shortness of breath.  Marland Kitchen albuterol (VENTOLIN HFA) 108 (90 Base) MCG/ACT inhaler Inhale 2 puffs into the lungs every 6 (six) hours as needed for wheezing or shortness of breath.  . ARIPiprazole (ABILIFY) 10 MG tablet Take 3 tablets (30 mg total) by mouth daily.  Marland Kitchen atorvastatin (LIPITOR) 40 MG tablet Take 1 tablet (40 mg total) by mouth daily.  Marland Kitchen diltiazem (CARDIZEM CD) 120 MG 24 hr capsule Take 1 capsule (120 mg total) by mouth at bedtime.  Marland Kitchen ELIQUIS 5 MG TABS tablet TAKE 1 TABLET TWICE A DAY  . escitalopram (LEXAPRO) 10 MG tablet Take 1 tablet (10 mg total) by mouth daily.  . Fluticasone-Salmeterol (ADVAIR DISKUS) 500-50 MCG/DOSE AEPB Inhale 1 puff into the lungs in the morning and at bedtime.  . furosemide (LASIX) 40 MG tablet TAKE 1 TABLET EVERY MORNING  . Glucerna (GLUCERNA) LIQD Take 237 mLs by mouth daily in the afternoon. Prefers Chocolate  . HYDROmorphone (DILAUDID) 2 MG tablet Take 1 tablet (2 mg total) by mouth every 6 (six) hours as needed for severe pain.  Marland Kitchen insulin glargine (LANTUS SOLOSTAR) 100 UNIT/ML Solostar Pen Inject 50 Units into the skin 2 (two) times daily.  Marland Kitchen LORazepam (ATIVAN) 1 MG tablet Take 1-2 tablets (1-2 mg total) by mouth See admin instructions. 1 mg QAM and 2 mg QHS  . meclizine (ANTIVERT) 12.5 MG tablet Take 12.5  mg by mouth 3 (three) times daily as needed for dizziness.  . metFORMIN (GLUCOPHAGE) 500 MG tablet Take 500 mg by mouth daily with breakfast.  . methocarbamol (ROBAXIN) 500 MG tablet Take 500 mg by mouth 3 (three) times daily.  . metoprolol succinate (TOPROL-XL) 100 MG 24 hr tablet Take 100 mg by mouth in the morning and at bedtime.  . metoprolol tartrate (LOPRESSOR) 25 MG tablet Take 1 tablet (25 mg total) by mouth 2 (two) times daily.  Marland Kitchen  Multiple Vitamin (MULTIVITAMIN WITH MINERALS) TABS tablet Take 1 tablet by mouth daily.  . NON FORMULARY Diet: _____ Regular, ___  __ NAS, ___x____Consistent Carbohydrate, _______NPO _____Other  . polyethylene glycol (MIRALAX / GLYCOLAX) 17 g packet Take 17 g by mouth daily.  . potassium chloride SA (KLOR-CON) 20 MEQ tablet Take 1 tablet (20 mEq total) by mouth daily.  . pregabalin (LYRICA) 200 MG capsule Take 1 capsule (200 mg total) by mouth 2 (two) times daily.  . RYBELSUS 7 MG TABS Take 7 mg by mouth daily at 12 noon.  . traZODone (DESYREL) 100 MG tablet Take 100 mg by mouth at bedtime as needed for sleep.  Marland Kitchen VASCEPA 1 g capsule TAKE 2 CAPSULES TWICE A DAY WITH MEALS  . Vitamin D, Cholecalciferol, 25 MCG (1000 UT) TABS Take 1 tablet by mouth daily in the afternoon.     SIGNIFICANT DIAGNOSTIC EXAMS   PREVIOUS   04-23-20: ct of head:  Minimal periventricular small vessel disease. No acute infarct. No mass or hemorrhage. Foci of arterial vascular calcification noted. Mucosal thickening noted in several ethmoid air cells.  04-23-20: mri of lumbar spine:  1. 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 2. Similar multilevel degenerative change without significant central canal stenosis. Similar mild left foraminal stenosis at L4-L5.  04-23-20: mri  of brain:  No acute infarction, hemorrhage, hydrocephalus, extra-axial collection or mass lesion. Scattered T2/FLAIR hyperintensities within the white matter, compatible with chronic microvascular ischemic disease and appropriate for patient's age. Mild diffuse cerebral atrophy  04-23-20 mri cervical spine:  No evidence of cervical spine infection. Multilevel spondylosis. Patent spinal canal. Mild left C6-7 neural foraminal narrowing. Central/paracentral protrusions at the C4-5 and C5-6 level abutting the ventral cord. No evidence of myelomalacia.  04-22-20: mri thoracic spine:  No evidence of infection. Mild degenerative changes and chronic superior T11 endplate deformity with minimal height loss. Shallow left T10-11 paracentral protrusion. Patent spinal canal and neural foramen.  07-25-20: mri lumbar spine:  3 x 5 mm nodule in the dorsal spinal canal at the L2 level likely intramural. No change from prior studies. 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 Multilevel degenerative changes, stable from the prior study.  NO NEW EXAMS.    LABS REVIEWED PREVIOUS    08-29-19: chol 128; ldl 71; trig 249; hdl 42 04-23-20: wbc 12.3; hgb 14.5; hct 41.9 mcv 90.1 plt 218; glucose 612; bun 35; creat 1.09; k+ 4.3; na++ 130; ca 9.4 liver normal albumin 3.9 hgb a1c 10.9 04-24-20: wbc 11.2; hgb 13.5; hct 40.0; mcv 91.1 plt 188; glucose 227; bun 27; creat 0.75; k+ 3.3; na++ 134; ca 8.4  04-30-20: k+ 3.9.   TODAY  07-25-20: wbc 11.2; hgb 13.5; hct 40.0; mcv 91.1 plt 188; glucose 285; bun 22; creat 0.92; k+ 3.7; na++ 138; ca 9.3 GFR >60; liver normal albumin 4.3 08-02-20; sed rate 28; uric acid 7.9   Review of Systems  Constitutional: Negative for malaise/fatigue.  Respiratory: Negative for cough and shortness of breath.   Cardiovascular: Negative for chest pain, palpitations and leg swelling.  Gastrointestinal: Negative for abdominal pain, constipation and heartburn.  Musculoskeletal: Positive for back pain and  myalgias. Negative for joint pain.       Has chronic pain   Skin: Negative.   Neurological: Negative for dizziness.  Psychiatric/Behavioral: The patient is not  nervous/anxious.    .   Physical Exam Constitutional:      General: She is not in acute distress.    Appearance: She is well-developed and well-nourished. She is obese. She is not diaphoretic.  Neck:     Thyroid: No thyromegaly.  Cardiovascular:     Rate and Rhythm: Normal rate and regular rhythm.     Pulses: Normal pulses and intact distal pulses.     Heart sounds: Normal heart sounds.  Pulmonary:     Effort: Pulmonary effort is normal. No respiratory distress.     Breath sounds: Normal breath sounds.  Abdominal:     General: Bowel sounds are normal. There is no distension.     Palpations: Abdomen is soft.     Tenderness: There is no abdominal tenderness.  Musculoskeletal:        General: No edema.     Cervical back: Neck supple.     Right lower leg: No edema.     Left lower leg: No edema.     Comments: Is able to move all extremities  Lymphadenopathy:     Cervical: No cervical adenopathy.  Skin:    General: Skin is warm and dry.  Neurological:     Mental Status: She is alert and oriented to person, place, and time.  Psychiatric:        Mood and Affect: Mood and affect and mood normal.     ASSESSMENT/ PLAN:  TODAY  1. Uncontrolled type 2 diabetes mellitus with peripheral autonomic neuropathy: hgb a1c 10.9 Her cbg readings remain elevated. It is too soon to increase rybelsus. Will continue lantus 50 units twice daily metformin 500 mg twice daily Will begin humalog 5 units with meals and will monitor her status.     Ok Edwards NP [facility]  Contact [phone] Monday through Friday 8am- 5pm  After hours call [phone]

## 2020-08-08 ENCOUNTER — Non-Acute Institutional Stay (SKILLED_NURSING_FACILITY): Payer: Medicare Other | Admitting: Adult Health

## 2020-08-08 DIAGNOSIS — F339 Major depressive disorder, recurrent, unspecified: Secondary | ICD-10-CM

## 2020-08-08 DIAGNOSIS — E662 Morbid (severe) obesity with alveolar hypoventilation: Secondary | ICD-10-CM

## 2020-08-08 DIAGNOSIS — M7918 Myalgia, other site: Secondary | ICD-10-CM | POA: Diagnosis not present

## 2020-08-08 NOTE — Progress Notes (Signed)
Location:  [place] Room Number: 960 Place of Service:  SNF (31)   CODE STATUS: full   Allergies  Allergen Reactions  . Ivp Dye [Iodinated Diagnostic Agents] Anaphylaxis and Swelling    Throat closes   . Codeine Other (See Comments)  . Iodine Other (See Comments)  . Latex     Latex tape pulls skin with it    Chief Complaint  Patient presents with  . Acute Visit    Care plan meeting.     HPI:  We have come together for her care plan meeting. BIMS 14/15 mood 5/30. Family present  she requires supervision to limited assist with her adls. She is out of bed without assistive device. She is occasionally incontinent of bladder and bowel. Her cbgs are reasonably controlled. She does use dilaudid 2-3 times daily for pain management. Weight 196.4 pounds has gained 12 pounds since admission has a good appetite. Spends most of her time in her room per her choice.  therapy: adls supervision level ambulating 100 feet without assistive device    She continues to be followed for her chronic illnesses including:  Morbid obesity with alveolar hypoventilation Depression recurrent  Myofascial pain dysfunction syndrome  Past Medical History:  Diagnosis Date  . Acute on chronic respiratory failure with hypoxia ([place]) 01/22/2019  . Atrial fibrillation with RVR 05/19/2017  . Bipolar I disorder 01/23/2015  . CAD (coronary artery disease)    Nonobstructive; Managed by Dr. Bronson Ing  . Cardiomegaly 01/12/2018  . Chronic anticoagulation 05/30/2015  . Chronic atrial fibrillation ([place]) 01/04/2015  . Chronic back pain    Lower back  . Chronic diastolic CHF (congestive heart failure) 05/30/2015  . Diabetes mellitus, type 2, without complication   . Diabetic neuropathy 02/06/2016  . Dysrhythmia    A-Fib  . Essential hypertension   . Herpes genitalis in women 07/16/2015  . Hyperlipidemia   . Hypoxia 02/01/2019  . Insomnia 01/23/2015  . Lipoma 02/08/2015  . Major depressive disorder    . Migraine headache with aura 02/12/2016  . 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[place]) 04/21/2019  . NAFL (nonalcoholic fatty liver) 4/54/0981  . OSA (obstructive sleep apnea) 02/24/2019   10/09/2018 - HST  - AHI 40.6   . Pulmonary hypertension   . Renal insufficiency    Managed by Dr. Wallace Keller  . RLS (restless legs syndrome) 04/27/2015    Past Surgical History:  Procedure Laterality Date  . BREAST REDUCTION SURGERY    . EYE SURGERY Right    cateracts  . HAMMER TOE SURGERY    . LEFT HEART CATH AND CORONARY ANGIOGRAPHY N/A 02/03/2018   Procedure: LEFT HEART CATH AND CORONARY ANGIOGRAPHY;  Surgeon: Martinique, Peter M, MD;  Location: [place] [facility];  Service: Cardiovascular;  Laterality: N/A;  . REVERSE SHOULDER ARTHROPLASTY Right 08/19/2019   Procedure: REVERSE SHOULDER ARTHROPLASTY;  Surgeon: Netta Cedars, MD;  Location: [facility];  Service: Orthopedics;  Laterality: Right;  interscalene block  . SHOULDER SURGERY Right    "I BROKE MY SHOUDLER  . THIGH SURGERY     "TO REMOVE A TUMOR "    Social History   Socioeconomic History  . Marital status: Married    Spouse name: Orpah Greek  . Number of children: 3  . Years of education: 4  . Highest education level: Bachelor's degree (e.g., BA, AB, BS)  Occupational History  . Occupation: Retired    Comment: marketing  Tobacco Use  . Smoking status: Never Smoker  .  Smokeless tobacco: Never Used  Vaping Use  . Vaping Use: Never used  Substance and Sexual Activity  . Alcohol use: Not Currently    Alcohol/week: 0.0 standard drinks  . Drug use: No  . Sexual activity: Yes    Partners: Male    Birth control/protection: Post-menopausal  Other Topics Concern  . Not on file  Social History Narrative   Lives at home with husband.       Right handed   Social Determinants of Health   Financial Resource Strain: Not on file  Food Insecurity: Not on file  Transportation Needs: Not on file  Physical Activity: Not on file  Stress: Not on file   Social Connections: Not on file  Intimate Partner Violence: Not on file   Family History  Problem Relation Age of Onset  . Diabetes Mother   . Heart disease Mother   . Heart disease Brother   . Heart disease Sister        CABG  . Diabetes Sister   . Alzheimer's disease Father   . Alcohol abuse Sister   . Mental illness Brother   . Diabetes Brother       VITAL SIGNS BP 138/78   Pulse 76   Temp 97.8 F (36.6 C)   Resp 18   Ht 5\' 2"  (1.575 m)   Wt 196 lb (88.9 kg)   SpO2 95%   BMI 35.85 kg/m   Facility-Administered Encounter Medications as of 08/08/2020  Medication  . bupivacaine-epinephrine (MARCAINE W/ EPI) 0.5% -1:200000 injection   Outpatient Encounter Medications as of 08/08/2020  Medication Sig  . acetaminophen (TYLENOL) 325 MG tablet Take 2 tablets (650 mg total) by mouth every 6 (six) hours as needed for mild pain, fever or headache (or Fever >/= 101).  Marland Kitchen albuterol (PROVENTIL) (2.5 MG/3ML) 0.083% nebulizer solution Take 3 mLs (2.5 mg total) by nebulization every 6 (six) hours as needed for wheezing or shortness of breath.  Marland Kitchen albuterol (VENTOLIN HFA) 108 (90 Base) MCG/ACT inhaler Inhale 2 puffs into the lungs every 6 (six) hours as needed for wheezing or shortness of breath.  . ARIPiprazole (ABILIFY) 10 MG tablet Take 3 tablets (30 mg total) by mouth daily.  Marland Kitchen atorvastatin (LIPITOR) 40 MG tablet Take 1 tablet (40 mg total) by mouth daily.  Marland Kitchen diltiazem (CARDIZEM CD) 120 MG 24 hr capsule Take 1 capsule (120 mg total) by mouth at bedtime.  Marland Kitchen ELIQUIS 5 MG TABS tablet TAKE 1 TABLET TWICE A DAY  . escitalopram (LEXAPRO) 10 MG tablet Take 1 tablet (10 mg total) by mouth daily.  . Fluticasone-Salmeterol (ADVAIR DISKUS) 500-50 MCG/DOSE AEPB Inhale 1 puff into the lungs in the morning and at bedtime.  . furosemide (LASIX) 40 MG tablet TAKE 1 TABLET EVERY MORNING  . Glucerna (GLUCERNA) LIQD Take 237 mLs by mouth daily in the afternoon. Prefers Chocolate  . HYDROmorphone  (DILAUDID) 2 MG tablet Take 1 tablet (2 mg total) by mouth every 6 (six) hours as needed for severe pain.  Marland Kitchen insulin glargine (LANTUS SOLOSTAR) 100 UNIT/ML Solostar Pen Inject 50 Units into the skin 2 (two) times daily.  Marland Kitchen LORazepam (ATIVAN) 1 MG tablet Take 1-2 tablets (1-2 mg total) by mouth See admin instructions. 1 mg QAM and 2 mg QHS  . meclizine (ANTIVERT) 12.5 MG tablet Take 12.5 mg by mouth 3 (three) times daily as needed for dizziness.  . metFORMIN (GLUCOPHAGE) 500 MG tablet Take 500 mg by mouth daily with breakfast.  .  methocarbamol (ROBAXIN) 500 MG tablet Take 500 mg by mouth 3 (three) times daily.  . metoprolol succinate (TOPROL-XL) 100 MG 24 hr tablet Take 100 mg by mouth in the morning and at bedtime.  . metoprolol tartrate (LOPRESSOR) 25 MG tablet Take 1 tablet (25 mg total) by mouth 2 (two) times daily.  . Multiple Vitamin (MULTIVITAMIN WITH MINERALS) TABS tablet Take 1 tablet by mouth daily.  . NON FORMULARY Diet: _____ Regular, ___  __ NAS, ___x____Consistent Carbohydrate, _______NPO _____Other  . polyethylene glycol (MIRALAX / GLYCOLAX) 17 g packet Take 17 g by mouth daily.  . potassium chloride SA (KLOR-CON) 20 MEQ tablet Take 1 tablet (20 mEq total) by mouth daily.  . pregabalin (LYRICA) 200 MG capsule Take 1 capsule (200 mg total) by mouth 2 (two) times daily.  . RYBELSUS 7 MG TABS Take 7 mg by mouth daily at 12 noon.  . traZODone (DESYREL) 100 MG tablet Take 100 mg by mouth at bedtime as needed for sleep.  Marland Kitchen VASCEPA 1 g capsule TAKE 2 CAPSULES TWICE A DAY WITH MEALS  . Vitamin D, Cholecalciferol, 25 MCG (1000 UT) TABS Take 1 tablet by mouth daily in the afternoon.     SIGNIFICANT DIAGNOSTIC EXAMS   PREVIOUS   04-23-20: ct of head:  Minimal periventricular small vessel disease. No acute infarct. No mass or hemorrhage. Foci of arterial vascular calcification noted. Mucosal thickening noted in several ethmoid air cells.  04-23-20: mri of lumbar spine:  1. Small  (5 mm) round mass within the dorsal spinal canal at L2, which is similar versus slightly increased in size compared to prior. 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 2. Similar multilevel degenerative change without significant central canal stenosis. Similar mild left foraminal stenosis at L4-L5.  04-23-20: mri of brain:  No acute infarction, hemorrhage, hydrocephalus, extra-axial collection or mass lesion. Scattered T2/FLAIR hyperintensities within the white matter, compatible with chronic microvascular ischemic disease and appropriate for patient's age. Mild diffuse cerebral atrophy  04-23-20 mri cervical spine:  No evidence of cervical spine infection. Multilevel spondylosis. Patent spinal canal. Mild left C6-7 neural foraminal narrowing. Central/paracentral protrusions at the C4-5 and C5-6 level abutting the ventral cord. No evidence of myelomalacia.  04-22-20: mri thoracic spine:  No evidence of infection. Mild degenerative changes and chronic superior T11 endplate deformity with minimal height loss. Shallow left T10-11 paracentral protrusion. Patent spinal canal and neural foramen.  07-25-20: mri lumbar spine:  3 x 5 mm nodule in the dorsal spinal canal at the L2 level likely intramural. No change from prior studies. 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 Multilevel degenerative changes, stable from the prior study.  NO NEW EXAMS.    LABS REVIEWED PREVIOUS    08-29-19: chol 128; ldl 71; trig 249; hdl 42 04-23-20: wbc 12.3; hgb 14.5; hct 41.9 mcv 90.1 plt 218; glucose 612; bun 35; creat 1.09; k+ 4.3; na++ 130; ca 9.4 liver normal albumin 3.9 hgb a1c 10.9 04-24-20: wbc 11.2; hgb 13.5; hct 40.0; mcv 91.1 plt 188; glucose 227; bun 27; creat 0.75; k+ 3.3; na++ 134; ca 8.4  04-30-20: k+ 3.9.  07-25-20: wbc 11.2; hgb 13.5; hct 40.0; mcv 91.1 plt 188; glucose 285; bun 22; creat 0.92; k+ 3.7; na++ 138; ca 9.3 GFR >60; liver  normal albumin 4.3 08-02-20; sed rate 28; uric acid 7.9   NO NEW LABS   Review of Systems  Constitutional: Negative for malaise/fatigue.  Respiratory: Negative for cough and shortness  of breath.   Cardiovascular: Negative for chest pain, palpitations and leg swelling.  Gastrointestinal: Negative for abdominal pain, constipation and heartburn.  Musculoskeletal: Positive for myalgias. Negative for back pain and joint pain.       Has chronic pain   Skin: Negative.   Neurological: Negative for dizziness.  Psychiatric/Behavioral: The patient is not nervous/anxious.     Physical Exam Constitutional:      General: She is not in acute distress.    Appearance: She is well-developed and well-nourished. She is obese. She is not diaphoretic.  Neck:     Thyroid: No thyromegaly.  Cardiovascular:     Rate and Rhythm: Normal rate and regular rhythm.     Pulses: Normal pulses and intact distal pulses.     Heart sounds: Normal heart sounds.  Pulmonary:     Effort: Pulmonary effort is normal. No respiratory distress.     Breath sounds: Normal breath sounds.  Abdominal:     General: Bowel sounds are normal. There is no distension.     Palpations: Abdomen is soft.     Tenderness: There is no abdominal tenderness.  Musculoskeletal:        General: No edema.     Cervical back: Neck supple.     Right lower leg: No edema.     Left lower leg: No edema.     Comments: Is able to move all extremities   Lymphadenopathy:     Cervical: No cervical adenopathy.  Skin:    General: Skin is warm and dry.  Neurological:     Mental Status: She is alert and oriented to person, place, and time.  Psychiatric:        Mood and Affect: Mood and affect and mood normal.       ASSESSMENT/ PLAN:  TODAY  1. Morbid obesity with alveolar hypoventilation 2. Depression recurrent 3. Myofascial pain dysfunction syndrome  Will continue current medications Will continue current plan of care Will continue therapy  as directed The goal  of her care is to return back home in the near future.    Ok Edwards NP [facility]  Contact [phone] Monday through Friday 8am- 5pm  After hours call [phone]

## 2020-08-09 ENCOUNTER — Non-Acute Institutional Stay (SKILLED_NURSING_FACILITY): Payer: Medicare Other | Admitting: Adult Health

## 2020-08-09 ENCOUNTER — Other Ambulatory Visit: Payer: Self-pay | Admitting: Adult Health

## 2020-08-09 ENCOUNTER — Encounter: Payer: Self-pay | Admitting: Adult Health

## 2020-08-09 ENCOUNTER — Other Ambulatory Visit (HOSPITAL_COMMUNITY)
Admission: RE | Admit: 2020-08-09 | Discharge: 2020-08-09 | Disposition: A | Discharge status: Home / Self Care | Payer: Medicare Other | Source: Skilled Nursing Facility | Attending: Adult Health | Admitting: Adult Health

## 2020-08-09 DIAGNOSIS — E1142 Type 2 diabetes mellitus with diabetic polyneuropathy: Secondary | ICD-10-CM | POA: Insufficient documentation

## 2020-08-09 DIAGNOSIS — E785 Hyperlipidemia, unspecified: Secondary | ICD-10-CM

## 2020-08-09 DIAGNOSIS — I482 Chronic atrial fibrillation, unspecified: Secondary | ICD-10-CM | POA: Diagnosis not present

## 2020-08-09 DIAGNOSIS — R0609 Other forms of dyspnea: Secondary | ICD-10-CM

## 2020-08-09 DIAGNOSIS — E1143 Type 2 diabetes mellitus with diabetic autonomic (poly)neuropathy: Secondary | ICD-10-CM

## 2020-08-09 DIAGNOSIS — M7918 Myalgia, other site: Secondary | ICD-10-CM

## 2020-08-09 DIAGNOSIS — E1165 Type 2 diabetes mellitus with hyperglycemia: Secondary | ICD-10-CM

## 2020-08-09 DIAGNOSIS — IMO0002 Reserved for concepts with insufficient information to code with codable children: Secondary | ICD-10-CM

## 2020-08-09 DIAGNOSIS — I5032 Chronic diastolic (congestive) heart failure: Secondary | ICD-10-CM

## 2020-08-09 DIAGNOSIS — E1169 Type 2 diabetes mellitus with other specified complication: Secondary | ICD-10-CM

## 2020-08-09 DIAGNOSIS — E782 Mixed hyperlipidemia: Secondary | ICD-10-CM

## 2020-08-09 DIAGNOSIS — I4821 Permanent atrial fibrillation: Secondary | ICD-10-CM

## 2020-08-09 DIAGNOSIS — Z09 Encounter for follow-up examination after completed treatment for conditions other than malignant neoplasm: Secondary | ICD-10-CM

## 2020-08-09 DIAGNOSIS — R0600 Dyspnea, unspecified: Secondary | ICD-10-CM

## 2020-08-09 LAB — BASIC METABOLIC PANEL
Anion gap: 10 (ref 5–15)
BUN: 22 mg/dL (ref 8–23)
CO2: 27 mmol/L (ref 22–32)
Calcium: 8.9 mg/dL (ref 8.9–10.3)
Chloride: 103 mmol/L (ref 98–111)
Creatinine, Ser: 0.7 mg/dL (ref 0.44–1.00)
GFR, Estimated: >60 mL/min (ref >60)
Glucose, Bld: 134 mg/dL — ABNORMAL HIGH (ref 70–99)
Potassium: 3.6 mmol/L (ref 3.5–5.1)
Sodium: 140 mmol/L (ref 135–145)

## 2020-08-09 LAB — HEMOGLOBIN A1C
Hgb A1c MFr Bld: 9.1 % — ABNORMAL HIGH (ref 4.8–5.6)
Mean Plasma Glucose: 214.47 mg/dL

## 2020-08-09 MED ORDER — HYDROMORPHONE HCL 2 MG PO TABS
2.0000 mg | ORAL_TABLET | Freq: Four times a day (QID) | ORAL | 0 refills | Status: DC | PRN
Start: 2020-08-09 — End: 2020-10-16

## 2020-08-09 MED ORDER — DILTIAZEM HCL ER COATED BEADS 120 MG PO CP24: 120.0000 mg | ORAL_CAPSULE | Freq: Every day | ORAL | 0 refills | Status: DC

## 2020-08-09 MED ORDER — APIXABAN 5 MG PO TABS: 5.0000 mg | ORAL_TABLET | Freq: Two times a day (BID) | ORAL | 0 refills | Status: DC

## 2020-08-09 MED ORDER — RYBELSUS 7 MG PO TABS: 7.0000 mg | ORAL_TABLET | Freq: Every day | ORAL | 0 refills | Status: DC

## 2020-08-09 MED ORDER — METOPROLOL SUCCINATE ER 100 MG PO TB24: 100.0000 mg | ORAL_TABLET | Freq: Two times a day (BID) | ORAL | 0 refills | Status: DC

## 2020-08-09 MED ORDER — MECLIZINE HCL 12.5 MG PO TABS: 12.5000 mg | ORAL_TABLET | Freq: Three times a day (TID) | ORAL | 0 refills | Status: DC | PRN

## 2020-08-09 MED ORDER — ARIPIPRAZOLE 10 MG PO TABS: 30.0000 mg | ORAL_TABLET | Freq: Every day | ORAL | 0 refills | Status: DC

## 2020-08-09 MED ORDER — LANTUS SOLOSTAR 100 UNIT/ML ~~LOC~~ SOPN: 50.0000 [IU] | PEN_INJECTOR | Freq: Two times a day (BID) | SUBCUTANEOUS | 0 refills | Status: DC

## 2020-08-09 MED ORDER — METHOCARBAMOL 500 MG PO TABS: 500.0000 mg | ORAL_TABLET | Freq: Three times a day (TID) | ORAL | 0 refills | Status: DC | PRN

## 2020-08-09 MED ORDER — ALBUTEROL SULFATE HFA 108 (90 BASE) MCG/ACT IN AERS
2.0000 | INHALATION_SPRAY | Freq: Four times a day (QID) | RESPIRATORY_TRACT | 0 refills | Status: DC | PRN
Start: 2020-08-09 — End: 2023-06-15

## 2020-08-09 MED ORDER — ICOSAPENT ETHYL 1 G PO CAPS: ORAL_CAPSULE | ORAL | 0 refills | Status: DC

## 2020-08-09 MED ORDER — POTASSIUM CHLORIDE CRYS ER 20 MEQ PO TBCR: 20.0000 meq | EXTENDED_RELEASE_TABLET | Freq: Every day | ORAL | 0 refills | Status: DC

## 2020-08-09 MED ORDER — METFORMIN HCL 500 MG PO TABS: 500.0000 mg | ORAL_TABLET | Freq: Two times a day (BID) | ORAL | 0 refills | Status: DC

## 2020-08-09 MED ORDER — FUROSEMIDE 40 MG PO TABS: ORAL_TABLET | ORAL | 0 refills | Status: DC

## 2020-08-09 MED ORDER — FLUTICASONE-SALMETEROL 500-50 MCG/DOSE IN AEPB: 1.0000 | INHALATION_SPRAY | Freq: Two times a day (BID) | RESPIRATORY_TRACT | 0 refills | Status: DC

## 2020-08-09 MED ORDER — METOPROLOL TARTRATE 25 MG PO TABS: 25.0000 mg | ORAL_TABLET | Freq: Two times a day (BID) | ORAL | 0 refills | Status: DC

## 2020-08-09 MED ORDER — ESCITALOPRAM OXALATE 10 MG PO TABS: 10.0000 mg | ORAL_TABLET | Freq: Every day | ORAL | 0 refills | Status: DC

## 2020-08-09 MED ORDER — PREGABALIN 200 MG PO CAPS: 200.0000 mg | ORAL_CAPSULE | Freq: Two times a day (BID) | ORAL | 0 refills | Status: DC

## 2020-08-09 MED ORDER — ATORVASTATIN CALCIUM 40 MG PO TABS: 40.0000 mg | ORAL_TABLET | Freq: Every day | ORAL | 0 refills | Status: DC

## 2020-08-09 MED ORDER — ALBUTEROL SULFATE (2.5 MG/3ML) 0.083% IN NEBU: 2.5000 mg | INHALATION_SOLUTION | Freq: Four times a day (QID) | RESPIRATORY_TRACT | 0 refills | Status: DC | PRN

## 2020-08-09 MED ORDER — LORAZEPAM 1 MG PO TABS: 1.0000 mg | ORAL_TABLET | Freq: Two times a day (BID) | ORAL | 0 refills | Status: DC

## 2020-08-09 MED ORDER — INSULIN LISPRO (1 UNIT DIAL) 100 UNIT/ML (KWIKPEN): 5.0000 [IU] | PEN_INJECTOR | Freq: Three times a day (TID) | SUBCUTANEOUS | 0 refills | Status: DC

## 2020-08-09 NOTE — Progress Notes (Signed)
Location:    [facility] Room Number: 134/P Place of Service:  SNF (31)    CODE STATUS: Full Code  Allergies  Allergen Reactions  . Ivp Dye [Iodinated Diagnostic Agents] Anaphylaxis and Swelling    Throat closes   . Codeine Other (See Comments)  . Iodine Other (See Comments)  . Latex     Latex tape pulls skin with it    Chief Complaint  Patient presents with  . Discharge Note    Discharge Visit     HPI:  She is being discharged to home with home health for pt/ot. She will need her prescriptions written and will need to follow up with her PCP. She will not need any dme. She had been in the ED for uncontrolled pain and weakness. She was admitted to this facility for short term rehab. She has participated in therapy both pt/ot. She is now ready to complete her therapy at home.    Past Medical History:  Diagnosis Date  . Acute on chronic respiratory failure with hypoxia ([place]) 01/22/2019  . Atrial fibrillation with RVR 05/19/2017  . Bipolar I disorder 01/23/2015  . CAD (coronary artery disease)    Nonobstructive; Managed by Dr. Bronson Ing  . Cardiomegaly 01/12/2018  . Chronic anticoagulation 05/30/2015  . Chronic atrial fibrillation ([place]) 01/04/2015  . Chronic back pain    Lower back  . Chronic diastolic CHF (congestive heart failure) 05/30/2015  . Diabetes mellitus, type 2, without complication   . Diabetic neuropathy 02/06/2016  . Dysrhythmia    A-Fib  . Essential hypertension   . Herpes genitalis in women 07/16/2015  . Hyperlipidemia   . Hypoxia 02/01/2019  . Insomnia 01/23/2015  . Lipoma 02/08/2015  . Major depressive disorder   . Migraine headache with aura 02/12/2016  . 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[place]) 04/21/2019  . NAFL (nonalcoholic fatty liver) 5/63/1497  . OSA (obstructive sleep apnea) 02/24/2019   10/09/2018 - HST  - AHI 40.6   . Pulmonary hypertension   . Renal insufficiency    Managed by Dr. Wallace Keller  . RLS (restless legs  syndrome) 04/27/2015    Past Surgical History:  Procedure Laterality Date  . BREAST REDUCTION SURGERY    . EYE SURGERY Right    cateracts  . HAMMER TOE SURGERY    . LEFT HEART CATH AND CORONARY ANGIOGRAPHY N/A 02/03/2018   Procedure: LEFT HEART CATH AND CORONARY ANGIOGRAPHY;  Surgeon: Martinique, Peter M, MD;  Location: [facility];  Service: Cardiovascular;  Laterality: N/A;  . REVERSE SHOULDER ARTHROPLASTY Right 08/19/2019   Procedure: REVERSE SHOULDER ARTHROPLASTY;  Surgeon: Netta Cedars, MD;  Location: [facility];  Service: Orthopedics;  Laterality: Right;  interscalene block  . SHOULDER SURGERY Right    "I BROKE MY SHOUDLER  . THIGH SURGERY     "TO REMOVE A TUMOR "    Social History   Socioeconomic History  . Marital status: Married    Spouse name: Orpah Greek  . Number of children: 3  . Years of education: 50  . Highest education level: Bachelor's degree (e.g., BA, AB, BS)  Occupational History  . Occupation: Retired    Comment: marketing  Tobacco Use  . Smoking status: Never Smoker  . Smokeless tobacco: Never Used  Vaping Use  . Vaping Use: Never used  Substance and Sexual Activity  . Alcohol use: Not Currently    Alcohol/week: 0.0 standard drinks  . Drug use: No  . Sexual activity: Yes  Partners: Male    Birth control/protection: Post-menopausal  Other Topics Concern  . Not on file  Social History Narrative   Lives at home with husband.       Right handed   Social Determinants of Health   Financial Resource Strain: Not on file  Food Insecurity: Not on file  Transportation Needs: Not on file  Physical Activity: Not on file  Stress: Not on file  Social Connections: Not on file  Intimate Partner Violence: Not on file   Family History  Problem Relation Age of Onset  . Diabetes Mother   . Heart disease Mother   . Heart disease Brother   . Heart disease Sister        CABG  . Diabetes Sister   . Alzheimer's disease Father   . Alcohol abuse Sister   .  Mental illness Brother   . Diabetes Brother     VITAL SIGNS BP 129/61   Pulse 67   Temp (!) 97.2 F (36.2 C)   Resp 20   Ht 5\' 2"  (1.575 m)   Wt 196 lb 6.4 oz (89.1 kg)   SpO2 96%   BMI 35.92 kg/m   Patient's Medications  New Prescriptions   No medications on file  Previous Medications   ACETAMINOPHEN (TYLENOL) 325 MG TABLET    Take 2 tablets (650 mg total) by mouth every 6 (six) hours as needed for mild pain, fever or headache (or Fever >/= 101).   ALBUTEROL (PROVENTIL) (2.5 MG/3ML) 0.083% NEBULIZER SOLUTION    Take 3 mLs (2.5 mg total) by nebulization every 6 (six) hours as needed for wheezing or shortness of breath.   ALBUTEROL (VENTOLIN HFA) 108 (90 BASE) MCG/ACT INHALER    Inhale 2 puffs into the lungs every 6 (six) hours as needed for wheezing or shortness of breath.   ARIPIPRAZOLE (ABILIFY) 10 MG TABLET    Take 3 tablets (30 mg total) by mouth daily.   ATORVASTATIN (LIPITOR) 40 MG TABLET    Take 1 tablet (40 mg total) by mouth daily.   BALSAM PERU-CASTOR OIL (VENELEX) OINT    Apply topically. Special Instructions: Apply to coccyx and bilateral buttocks q shift for prevention/ redness.   DILTIAZEM (CARDIZEM CD) 120 MG 24 HR CAPSULE    Take 1 capsule (120 mg total) by mouth at bedtime.   ELIQUIS 5 MG TABS TABLET    TAKE 1 TABLET TWICE A DAY   ESCITALOPRAM (LEXAPRO) 10 MG TABLET    Take 1 tablet (10 mg total) by mouth daily.   FLUTICASONE-SALMETEROL (ADVAIR DISKUS) 500-50 MCG/DOSE AEPB    Inhale 1 puff into the lungs in the morning and at bedtime.   FUROSEMIDE (LASIX) 40 MG TABLET    TAKE 1 TABLET EVERY MORNING   HYDROMORPHONE (DILAUDID) 2 MG TABLET    Take 1 tablet (2 mg total) by mouth every 6 (six) hours as needed for severe pain.   INSULIN GLARGINE (LANTUS SOLOSTAR) 100 UNIT/ML SOLOSTAR PEN    Inject 50 Units into the skin 2 (two) times daily.   INSULIN LISPRO (HUMALOG KWIKPEN) 100 UNIT/ML KWIKPEN    Inject 5 Units into the skin 3 (three) times daily with meals.   LORAZEPAM  (ATIVAN) 1 MG TABLET    Take 1 mg by mouth every morning. And give 2 tablets by mouth at bedtime   MECLIZINE (ANTIVERT) 12.5 MG TABLET    Take 12.5 mg by mouth 3 (three) times daily as needed for dizziness.  METFORMIN (GLUCOPHAGE) 500 MG TABLET    Take 500 mg by mouth 2 (two) times daily after a meal.   METHOCARBAMOL (ROBAXIN) 500 MG TABLET    Take 500 mg by mouth 3 (three) times daily as needed.   METOPROLOL SUCCINATE (TOPROL-XL) 100 MG 24 HR TABLET    Take 100 mg by mouth in the morning and at bedtime.   METOPROLOL TARTRATE (LOPRESSOR) 25 MG TABLET    Take 1 tablet (25 mg total) by mouth 2 (two) times daily.   MULTIPLE VITAMIN (MULTIVITAMIN WITH MINERALS) TABS TABLET    Take 1 tablet by mouth daily.   NON FORMULARY    Diet: _____ Regular, ___  __ NAS, ___x____Consistent Carbohydrate, _______NPO _____Other   POLYETHYLENE GLYCOL (MIRALAX / GLYCOLAX) 17 G PACKET    Take 17 g by mouth daily.   POTASSIUM CHLORIDE SA (KLOR-CON) 20 MEQ TABLET    Take 1 tablet (20 mEq total) by mouth daily.   PREGABALIN (LYRICA) 200 MG CAPSULE    Take 1 capsule (200 mg total) by mouth 2 (two) times daily.   RYBELSUS 7 MG TABS    Take 7 mg by mouth daily at 12 noon.   VASCEPA 1 G CAPSULE    TAKE 2 CAPSULES TWICE A DAY WITH MEALS   VITAMIN D, CHOLECALCIFEROL, 25 MCG (1000 UT) TABS    Take 1 tablet by mouth daily in the afternoon.  Modified Medications   No medications on file  Discontinued Medications   GLUCERNA (GLUCERNA) LIQD    Take 237 mLs by mouth daily in the afternoon. Prefers Chocolate   LORAZEPAM (ATIVAN) 1 MG TABLET    Take 1-2 tablets (1-2 mg total) by mouth See admin instructions. 1 mg QAM and 2 mg QHS   TRAZODONE (DESYREL) 100 MG TABLET    Take 100 mg by mouth at bedtime as needed for sleep.     SIGNIFICANT DIAGNOSTIC EXAMS   PREVIOUS   04-23-20: ct of head:  Minimal periventricular small vessel disease. No acute infarct. No mass or hemorrhage. Foci of arterial vascular calcification noted.  Mucosal thickening noted in several ethmoid air cells.  04-23-20: mri of lumbar spine:  1. 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 2. Similar multilevel degenerative change without significant central canal stenosis. Similar mild left foraminal stenosis at L4-L5.  04-23-20: mri of brain:  No acute infarction, hemorrhage, hydrocephalus, extra-axial collection or mass lesion. Scattered T2/FLAIR hyperintensities within the white matter, compatible with chronic microvascular ischemic disease and appropriate for patient's age. Mild diffuse cerebral atrophy  04-23-20 mri cervical spine:  No evidence of cervical spine infection. Multilevel spondylosis. Patent spinal canal. Mild left C6-7 neural foraminal narrowing. Central/paracentral protrusions at the C4-5 and C5-6 level abutting the ventral cord. No evidence of myelomalacia.  04-22-20: mri thoracic spine:  No evidence of infection. Mild degenerative changes and chronic superior T11 endplate deformity with minimal height loss. Shallow left T10-11 paracentral protrusion. Patent spinal canal and neural foramen.  07-25-20: mri lumbar spine:  3 x 5 mm nodule in the dorsal spinal canal at the L2 level likely intramural. No change from prior studies. 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 Multilevel degenerative changes, stable from the prior study.  NO NEW EXAMS.    LABS REVIEWED PREVIOUS    08-29-19: chol 128; ldl 71; trig 249;  hdl 42 04-23-20: wbc 12.3; hgb 14.5; hct 41.9 mcv 90.1 plt 218; glucose 612; bun 35; creat 1.09; k+ 4.3; na++ 130; ca 9.4 liver normal albumin 3.9 hgb a1c 10.9 04-24-20: wbc 11.2; hgb 13.5; hct 40.0; mcv 91.1 plt 188; glucose 227; bun 27; creat 0.75; k+ 3.3; na++ 134; ca 8.4  04-30-20: k+ 3.9.  07-25-20: wbc 11.2; hgb 13.5; hct  40.0; mcv 91.1 plt 188; glucose 285; bun 22; creat 0.92; k+ 3.7; na++ 138; ca 9.3 GFR >60; liver normal albumin 4.3 08-02-20; sed rate 28; uric acid 7.9   NO NEW LABS   Review of Systems  Constitutional: Negative for malaise/fatigue.  Respiratory: Negative for cough and shortness of breath.   Cardiovascular: Negative for chest pain, palpitations and leg swelling.  Gastrointestinal: Negative for abdominal pain, constipation and heartburn.  Musculoskeletal: Positive for myalgias. Negative for back pain and joint pain.       Has chronic pain   Skin: Negative.   Neurological: Negative for dizziness.  Psychiatric/Behavioral: The patient is not nervous/anxious.     Physical Exam Constitutional:      General: She is not in acute distress.    Appearance: She is well-developed. She is obese. She is not diaphoretic.  Neck:     Thyroid: No thyromegaly.  Cardiovascular:     Rate and Rhythm: Normal rate and regular rhythm.     Pulses: Normal pulses.     Heart sounds: Normal heart sounds.  Pulmonary:     Effort: Pulmonary effort is normal. No respiratory distress.     Breath sounds: Normal breath sounds.  Abdominal:     General: Bowel sounds are normal. There is no distension.     Palpations: Abdomen is soft.     Tenderness: There is no abdominal tenderness.  Musculoskeletal:        General: Normal range of motion.     Cervical back: Neck supple.     Right lower leg: No edema.     Left lower leg: No edema.  Lymphadenopathy:     Cervical: No cervical adenopathy.  Skin:    General: Skin is warm and dry.  Neurological:     Mental Status: She is alert and oriented to person, place, and time.  Psychiatric:        Mood and Affect: Mood normal.       ASSESSMENT/ PLAN:  Patient is being discharged with the following home health services:  Pt/ot to evaluate and treat as indicated for gait balance adl training.   Patient is being discharged with the following durable medical equipment:   None needed   Patient has been advised to f/u with their PCP in 1-2 weeks to bring them up to date on their rehab stay.  Social services at facility was responsible for arranging this appointment.  Pt was provided with a 30 day supply of prescriptions for medications and refills must be obtained from their PCP.  For controlled substances, a more limited supply may be provided adequate until PCP appointment only.  A 30 day supply of her prescription medications with dilaudid 2 mg #10 tabs; #90 ativan 1 mg tabs; #60 lyrica 300 mg tabs with [facility] on [address]   Time spent with patient 35 minutes: for dme; medications; home health.    Ok Edwards NP [facility]  Contact [phone] Monday through Friday 8am- 5pm  After hours call [phone]

## 2020-08-11 ENCOUNTER — Other Ambulatory Visit: Payer: Self-pay | Admitting: Adult Health

## 2020-08-14 ENCOUNTER — Encounter: Payer: Self-pay | Admitting: Family Medicine

## 2020-08-14 ENCOUNTER — Ambulatory Visit (INDEPENDENT_AMBULATORY_CARE_PROVIDER_SITE_OTHER): Payer: Medicare Other | Admitting: Family Medicine

## 2020-08-14 ENCOUNTER — Other Ambulatory Visit: Payer: Self-pay

## 2020-08-14 VITALS — BP 100/68 | HR 61 | Temp 97.7°F | Ht 62.0 in | Wt 204.2 lb

## 2020-08-14 DIAGNOSIS — R296 Repeated falls: Secondary | ICD-10-CM | POA: Diagnosis not present

## 2020-08-14 DIAGNOSIS — G894 Chronic pain syndrome: Secondary | ICD-10-CM | POA: Diagnosis not present

## 2020-08-14 DIAGNOSIS — F01A0 Vascular dementia, mild, without behavioral disturbance, psychotic disturbance, mood disturbance, and anxiety: Secondary | ICD-10-CM

## 2020-08-14 DIAGNOSIS — M5441 Lumbago with sciatica, right side: Secondary | ICD-10-CM

## 2020-08-14 DIAGNOSIS — M461 Sacroiliitis, not elsewhere classified: Secondary | ICD-10-CM | POA: Diagnosis not present

## 2020-08-14 DIAGNOSIS — I482 Chronic atrial fibrillation, unspecified: Secondary | ICD-10-CM | POA: Diagnosis not present

## 2020-08-14 DIAGNOSIS — Z7984 Long term (current) use of oral hypoglycemic drugs: Secondary | ICD-10-CM | POA: Diagnosis not present

## 2020-08-14 DIAGNOSIS — J449 Chronic obstructive pulmonary disease, unspecified: Secondary | ICD-10-CM | POA: Diagnosis not present

## 2020-08-14 DIAGNOSIS — I25118 Atherosclerotic heart disease of native coronary artery with other forms of angina pectoris: Secondary | ICD-10-CM

## 2020-08-14 DIAGNOSIS — E1142 Type 2 diabetes mellitus with diabetic polyneuropathy: Secondary | ICD-10-CM

## 2020-08-14 DIAGNOSIS — I5032 Chronic diastolic (congestive) heart failure: Secondary | ICD-10-CM | POA: Diagnosis not present

## 2020-08-14 DIAGNOSIS — G8929 Other chronic pain: Secondary | ICD-10-CM | POA: Diagnosis not present

## 2020-08-14 DIAGNOSIS — M6281 Muscle weakness (generalized): Secondary | ICD-10-CM | POA: Diagnosis not present

## 2020-08-14 DIAGNOSIS — M792 Neuralgia and neuritis, unspecified: Secondary | ICD-10-CM | POA: Diagnosis not present

## 2020-08-14 DIAGNOSIS — E1140 Type 2 diabetes mellitus with diabetic neuropathy, unspecified: Secondary | ICD-10-CM | POA: Diagnosis not present

## 2020-08-14 DIAGNOSIS — Z7901 Long term (current) use of anticoagulants: Secondary | ICD-10-CM | POA: Diagnosis not present

## 2020-08-14 DIAGNOSIS — M797 Fibromyalgia: Secondary | ICD-10-CM | POA: Diagnosis not present

## 2020-08-14 DIAGNOSIS — F319 Bipolar disorder, unspecified: Secondary | ICD-10-CM | POA: Diagnosis not present

## 2020-08-14 DIAGNOSIS — I110 Hypertensive heart disease with heart failure: Secondary | ICD-10-CM | POA: Diagnosis not present

## 2020-08-14 DIAGNOSIS — I2510 Atherosclerotic heart disease of native coronary artery without angina pectoris: Secondary | ICD-10-CM | POA: Diagnosis not present

## 2020-08-14 DIAGNOSIS — E1165 Type 2 diabetes mellitus with hyperglycemia: Secondary | ICD-10-CM | POA: Diagnosis not present

## 2020-08-14 DIAGNOSIS — R262 Difficulty in walking, not elsewhere classified: Secondary | ICD-10-CM | POA: Diagnosis not present

## 2020-08-14 DIAGNOSIS — F0150 Vascular dementia without behavioral disturbance: Secondary | ICD-10-CM

## 2020-08-14 DIAGNOSIS — M5442 Lumbago with sciatica, left side: Secondary | ICD-10-CM

## 2020-08-14 MED ORDER — INSULIN LISPRO (1 UNIT DIAL) 100 UNIT/ML (KWIKPEN): PEN_INJECTOR | SUBCUTANEOUS | 3 refills | Status: DC

## 2020-08-14 MED ORDER — PROLIA 60 MG/ML ~~LOC~~ SOSY: 60.0000 mg | PREFILLED_SYRINGE | SUBCUTANEOUS | 1 refills | Status: DC

## 2020-08-14 NOTE — Progress Notes (Signed)
Subjective:  Patient ID: Amber Stephenson, female    DOB: [birth year]  Age: 75 y.o. MRN: [id number]  CC: Follow-up (D/C from rehab for pain)   HPI Amber Stephenson presents for transition of care due to release from nursing home.  She was there for pain management.  And was discharged to home several days ago.  Medications were reviewed in detail and are accurate as recorded below.  She is taking Dilaudid for her pain currently and has an appointment to see a pain specialist coming up in the next several days.  Follow-up of diabetes.Glucose log not available.Readings noted to be frequently over 200. Patient denies symptoms such as polyuria, polydipsia, excessive hunger, nausea. No significant hypoglycemic spells noted. Medications as noted below. Taking them regularly without complication/adverse reaction being reported today.  Depression screen [facility] 2/9 08/14/2020 06/13/2020 05/29/2020  Decreased Interest 0 0 0  Down, Depressed, Hopeless 0 0 0  PHQ - 2 Score 0 0 0  Altered sleeping - 3 -  Tired, decreased energy - 2 -  Change in appetite - 3 -  Feeling bad or failure about yourself  - 0 -  Trouble concentrating - 0 -  Moving slowly or fidgety/restless - 0 -  Suicidal thoughts - 0 -  PHQ-9 Score - 8 -  Difficult doing work/chores - - -  Some recent data might be hidden    History Amber Stephenson has a past medical history of Acute on chronic respiratory failure with hypoxia ([place]) (01/22/2019), Atrial fibrillation with RVR (05/19/2017), Bipolar I disorder (01/23/2015), CAD (coronary artery disease), Cardiomegaly (01/12/2018), Chronic anticoagulation (05/30/2015), Chronic atrial fibrillation ([facility]) (01/04/2015), Chronic back pain, Chronic diastolic CHF (congestive heart failure) (05/30/2015), Diabetes mellitus, type 2, without complication, Diabetic neuropathy (02/06/2016), Dysrhythmia, Essential hypertension, Herpes genitalis in women (07/16/2015), Hyperlipidemia, Hypoxia (02/01/2019), Insomnia  (01/23/2015), Lipoma (02/08/2015), Major depressive disorder, Migraine headache with aura (02/12/2016), 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[place]) (04/21/2019), NAFL (nonalcoholic fatty liver) (10/28/4130), OSA (obstructive sleep apnea) (02/24/2019), Pulmonary hypertension, Renal insufficiency, and RLS (restless legs syndrome) (04/27/2015).   She has a past surgical history that includes THIGH SURGERY; Shoulder surgery (Right); Breast reduction surgery; Eye surgery (Right); Hammer toe surgery; LEFT HEART CATH AND CORONARY ANGIOGRAPHY (N/A, 02/03/2018); and Reverse shoulder arthroplasty (Right, 08/19/2019).   Her family history includes Alcohol abuse in her sister; Alzheimer's disease in her father; Diabetes in her brother, mother, and sister; Heart disease in her brother, mother, and sister; Mental illness in her brother.She reports that she has never smoked. She has never used smokeless tobacco. She reports previous alcohol use. She reports that she does not use drugs.    ROS Review of Systems  Constitutional: Negative.   HENT: Negative.   Eyes: Negative for visual disturbance.  Respiratory: Negative for shortness of breath.   Cardiovascular: Negative for chest pain.  Gastrointestinal: Negative for abdominal pain.  Musculoskeletal: Positive for arthralgias, back pain and myalgias.    Objective:  BP 100/68   Pulse 61   Temp 97.7 F (36.5 C)   Ht 5\' 2"  (1.575 m)   Wt 204 lb 3.2 oz (92.6 kg)   SpO2 94%   BMI 37.35 kg/m   BP Readings from Last 3 Encounters:  08/14/20 100/68  08/09/20 129/61  08/08/20 138/78    Wt Readings from Last 3 Encounters:  08/14/20 204 lb 3.2 oz (92.6 kg)  08/09/20 196 lb 6.4 oz (89.1 kg)  08/08/20 196 lb (88.9 kg)     Physical Exam Constitutional:  General: She is not in acute distress.    Appearance: She is well-developed.  HENT:     Head: Normocephalic and atraumatic.  Eyes:     Conjunctiva/sclera: Conjunctivae normal.     Pupils: Pupils are  equal, round, and reactive to light.  Neck:     Thyroid: No thyromegaly.  Cardiovascular:     Rate and Rhythm: Normal rate and regular rhythm.     Heart sounds: Normal heart sounds. No murmur heard.   Pulmonary:     Effort: Pulmonary effort is normal. No respiratory distress.     Breath sounds: Normal breath sounds. No wheezing or rales.  Abdominal:     General: Bowel sounds are normal. There is no distension.     Palpations: Abdomen is soft.     Tenderness: There is no abdominal tenderness.  Musculoskeletal:        General: Normal range of motion.     Cervical back: Normal range of motion and neck supple.  Lymphadenopathy:     Cervical: No cervical adenopathy.  Skin:    General: Skin is warm and dry.  Neurological:     Mental Status: She is alert and oriented to person, place, and time.  Psychiatric:        Behavior: Behavior normal.        Thought Content: Thought content normal.        Judgment: Judgment normal.       Assessment & Plan:   Suhana was seen today for follow-up.  Diagnoses and all orders for this visit:  Chronic bilateral low back pain with bilateral sciatica  Chronic pain syndrome  Nerve pain  Coronary artery disease of native artery of native heart with stable angina pectoris (HCC)  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HCC)  Diabetic polyneuropathy associated with type 2 diabetes mellitus ([place])  Other orders -     insulin lispro (HUMALOG KWIKPEN) 100 UNIT/ML KwikPen; With each meal check glucose. If glucose is <100. Use 0 units. 101-200 use 5 units. Over 200 use 10 units -     denosumab (PROLIA) 60 MG/ML SOSY injection; Inject 60 mg into the skin every 6 (six) months.       I have changed [name]. Lynam's insulin lispro. I am also having her start on Prolia. Additionally, I am having her maintain her acetaminophen, multivitamin with minerals, polyethylene glycol, NON FORMULARY, Vitamin D (Cholecalciferol), Venelex, albuterol,  albuterol, ARIPiprazole, atorvastatin, diltiazem, apixaban, escitalopram, Fluticasone-Salmeterol, furosemide, HYDROmorphone, Lantus SoloStar, LORazepam, meclizine, metFORMIN, methocarbamol, metoprolol succinate, metoprolol tartrate, potassium chloride SA, pregabalin, Rybelsus, and icosapent Ethyl.  Allergies as of 08/14/2020      Reactions   Ivp Dye [iodinated Diagnostic Agents] Anaphylaxis, Swelling   Throat closes   Codeine Other (See Comments)   Iodine Other (See Comments)   Latex    Latex tape pulls skin with it      Medication List       Accurate as of August 14, 2020 11:59 PM. If you have any questions, ask your nurse or doctor.        acetaminophen 325 MG tablet Commonly known as: TYLENOL Take 2 tablets (650 mg total) by mouth every 6 (six) hours as needed for mild pain, fever or headache (or Fever >/= 101).   albuterol (2.5 MG/3ML) 0.083% nebulizer solution Commonly known as: PROVENTIL Take 3 mLs (2.5 mg total) by nebulization every 6 (six) hours as needed for wheezing or shortness of breath.   albuterol 108 (90 Base) MCG/ACT inhaler  Commonly known as: VENTOLIN HFA Inhale 2 puffs into the lungs every 6 (six) hours as needed for wheezing or shortness of breath.   apixaban 5 MG Tabs tablet Commonly known as: Eliquis Take 1 tablet (5 mg total) by mouth 2 (two) times daily.   ARIPiprazole 10 MG tablet Commonly known as: ABILIFY Take 3 tablets (30 mg total) by mouth daily.   atorvastatin 40 MG tablet Commonly known as: LIPITOR Take 1 tablet (40 mg total) by mouth daily.   diltiazem 120 MG 24 hr capsule Commonly known as: CARDIZEM CD Take 1 capsule (120 mg total) by mouth at bedtime.   escitalopram 10 MG tablet Commonly known as: Lexapro Take 1 tablet (10 mg total) by mouth daily.   Fluticasone-Salmeterol 500-50 MCG/DOSE Aepb Commonly known as: Advair Diskus Inhale 1 puff into the lungs in the morning and at bedtime.   furosemide 40 MG tablet Commonly known as:  LASIX TAKE 1 TABLET EVERY MORNING   HYDROmorphone 2 MG tablet Commonly known as: Dilaudid Take 1 tablet (2 mg total) by mouth every 6 (six) hours as needed for severe pain.   icosapent Ethyl 1 g capsule Commonly known as: Vascepa TAKE 2 CAPSULES TWICE A DAY WITH MEALS   insulin lispro 100 UNIT/ML KwikPen Commonly known as: HumaLOG KwikPen With each meal check glucose. If glucose is <100. Use 0 units. 101-200 use 5 units. Over 200 use 10 units What changed:   how much to take  how to take this  when to take this  additional instructions Changed by: [name], MD   Lantus SoloStar 100 UNIT/ML Solostar Pen Generic drug: insulin glargine Inject 50 Units into the skin 2 (two) times daily.   LORazepam 1 MG tablet Commonly known as: ATIVAN Take 1 tablet (1 mg total) by mouth 2 (two) times daily. And give 2 tablets by mouth at bedtime   meclizine 12.5 MG tablet Commonly known as: ANTIVERT Take 1 tablet (12.5 mg total) by mouth 3 (three) times daily as needed for dizziness.   metFORMIN 500 MG tablet Commonly known as: GLUCOPHAGE Take 1 tablet (500 mg total) by mouth 2 (two) times daily after a meal.   methocarbamol 500 MG tablet Commonly known as: ROBAXIN Take 1 tablet (500 mg total) by mouth 3 (three) times daily as needed.   metoprolol succinate 100 MG 24 hr tablet Commonly known as: TOPROL-XL Take 1 tablet (100 mg total) by mouth in the morning and at bedtime.   metoprolol tartrate 25 MG tablet Commonly known as: LOPRESSOR Take 1 tablet (25 mg total) by mouth 2 (two) times daily.   multivitamin with minerals Tabs tablet Take 1 tablet by mouth daily.   NON FORMULARY Diet: _____ Regular, ___  __ NAS, ___x____Consistent Carbohydrate, _______NPO _____Other   polyethylene glycol 17 g packet Commonly known as: MIRALAX / GLYCOLAX Take 17 g by mouth daily.   potassium chloride SA 20 MEQ tablet Commonly known as: KLOR-CON Take 1 tablet (20 mEq total) by mouth  daily.   pregabalin 200 MG capsule Commonly known as: Lyrica Take 1 capsule (200 mg total) by mouth 2 (two) times daily.   Prolia 60 MG/ML Sosy injection Generic drug: denosumab Inject 60 mg into the skin every 6 (six) months. Started by: [name], MD   Rybelsus 7 MG Tabs Generic drug: Semaglutide Take 7 mg by mouth daily at 12 noon.   Venelex Oint Apply topically. Special Instructions: Apply to coccyx and bilateral buttocks q shift for prevention/ redness.  Vitamin D (Cholecalciferol) 25 MCG (1000 UT) Tabs Take 1 tablet by mouth daily in the afternoon.        Follow-up: Return in about 3 months (around 11/14/2020).  [name], M.D.

## 2020-08-15 ENCOUNTER — Encounter: Payer: Self-pay | Admitting: Family Medicine

## 2020-08-16 DIAGNOSIS — E1165 Type 2 diabetes mellitus with hyperglycemia: Secondary | ICD-10-CM | POA: Diagnosis not present

## 2020-08-16 DIAGNOSIS — E1140 Type 2 diabetes mellitus with diabetic neuropathy, unspecified: Secondary | ICD-10-CM | POA: Diagnosis not present

## 2020-08-16 DIAGNOSIS — I110 Hypertensive heart disease with heart failure: Secondary | ICD-10-CM | POA: Diagnosis not present

## 2020-08-16 DIAGNOSIS — G894 Chronic pain syndrome: Secondary | ICD-10-CM | POA: Diagnosis not present

## 2020-08-16 DIAGNOSIS — I5032 Chronic diastolic (congestive) heart failure: Secondary | ICD-10-CM | POA: Diagnosis not present

## 2020-08-16 DIAGNOSIS — M461 Sacroiliitis, not elsewhere classified: Secondary | ICD-10-CM | POA: Diagnosis not present

## 2020-08-17 DIAGNOSIS — E1140 Type 2 diabetes mellitus with diabetic neuropathy, unspecified: Secondary | ICD-10-CM | POA: Diagnosis not present

## 2020-08-17 DIAGNOSIS — G894 Chronic pain syndrome: Secondary | ICD-10-CM | POA: Diagnosis not present

## 2020-08-17 DIAGNOSIS — E1165 Type 2 diabetes mellitus with hyperglycemia: Secondary | ICD-10-CM | POA: Diagnosis not present

## 2020-08-17 DIAGNOSIS — I5032 Chronic diastolic (congestive) heart failure: Secondary | ICD-10-CM | POA: Diagnosis not present

## 2020-08-17 DIAGNOSIS — I110 Hypertensive heart disease with heart failure: Secondary | ICD-10-CM | POA: Diagnosis not present

## 2020-08-17 DIAGNOSIS — M461 Sacroiliitis, not elsewhere classified: Secondary | ICD-10-CM | POA: Diagnosis not present

## 2020-08-20 ENCOUNTER — Telehealth: Payer: Self-pay

## 2020-08-20 DIAGNOSIS — E1165 Type 2 diabetes mellitus with hyperglycemia: Secondary | ICD-10-CM | POA: Diagnosis not present

## 2020-08-20 DIAGNOSIS — I110 Hypertensive heart disease with heart failure: Secondary | ICD-10-CM | POA: Diagnosis not present

## 2020-08-20 DIAGNOSIS — G894 Chronic pain syndrome: Secondary | ICD-10-CM | POA: Diagnosis not present

## 2020-08-20 DIAGNOSIS — E1140 Type 2 diabetes mellitus with diabetic neuropathy, unspecified: Secondary | ICD-10-CM | POA: Diagnosis not present

## 2020-08-20 DIAGNOSIS — I5032 Chronic diastolic (congestive) heart failure: Secondary | ICD-10-CM | POA: Diagnosis not present

## 2020-08-20 DIAGNOSIS — M461 Sacroiliitis, not elsewhere classified: Secondary | ICD-10-CM | POA: Diagnosis not present

## 2020-08-20 NOTE — Telephone Encounter (Signed)
Amber Stephenson at [facility] wanted to make you aware that patient has had 2 falls.  One last night 3/20 and this morning 3/21.  No bruising.  Back is sore.  Just wanted to make you aware.

## 2020-08-21 ENCOUNTER — Other Ambulatory Visit: Payer: Self-pay | Admitting: Adult Health

## 2020-08-22 DIAGNOSIS — F251 Schizoaffective disorder, depressive type: Secondary | ICD-10-CM | POA: Diagnosis not present

## 2020-08-23 ENCOUNTER — Other Ambulatory Visit (HOSPITAL_COMMUNITY): Payer: Self-pay | Admitting: Psychiatry

## 2020-08-23 DIAGNOSIS — E1165 Type 2 diabetes mellitus with hyperglycemia: Secondary | ICD-10-CM | POA: Diagnosis not present

## 2020-08-23 DIAGNOSIS — G894 Chronic pain syndrome: Secondary | ICD-10-CM | POA: Diagnosis not present

## 2020-08-23 DIAGNOSIS — I110 Hypertensive heart disease with heart failure: Secondary | ICD-10-CM | POA: Diagnosis not present

## 2020-08-23 DIAGNOSIS — E1140 Type 2 diabetes mellitus with diabetic neuropathy, unspecified: Secondary | ICD-10-CM | POA: Diagnosis not present

## 2020-08-23 DIAGNOSIS — I5032 Chronic diastolic (congestive) heart failure: Secondary | ICD-10-CM | POA: Diagnosis not present

## 2020-08-23 DIAGNOSIS — M461 Sacroiliitis, not elsewhere classified: Secondary | ICD-10-CM | POA: Diagnosis not present

## 2020-08-27 ENCOUNTER — Encounter: Payer: Self-pay | Admitting: Neurology

## 2020-08-27 ENCOUNTER — Ambulatory Visit (INDEPENDENT_AMBULATORY_CARE_PROVIDER_SITE_OTHER): Payer: Medicare Other | Admitting: Neurology

## 2020-08-27 ENCOUNTER — Other Ambulatory Visit: Payer: Self-pay

## 2020-08-27 VITALS — BP 140/82 | HR 70 | Resp 18 | Ht 62.0 in | Wt 198.0 lb

## 2020-08-27 DIAGNOSIS — F01A0 Vascular dementia, mild, without behavioral disturbance, psychotic disturbance, mood disturbance, and anxiety: Secondary | ICD-10-CM

## 2020-08-27 DIAGNOSIS — I25118 Atherosclerotic heart disease of native coronary artery with other forms of angina pectoris: Secondary | ICD-10-CM | POA: Diagnosis not present

## 2020-08-27 DIAGNOSIS — F0150 Vascular dementia without behavioral disturbance: Secondary | ICD-10-CM | POA: Diagnosis not present

## 2020-08-27 DIAGNOSIS — H40013 Open angle with borderline findings, low risk, bilateral: Secondary | ICD-10-CM | POA: Diagnosis not present

## 2020-08-27 DIAGNOSIS — G43109 Migraine with aura, not intractable, without status migrainosus: Secondary | ICD-10-CM | POA: Diagnosis not present

## 2020-08-27 MED ORDER — EMGALITY 120 MG/ML ~~LOC~~ SOAJ: SUBCUTANEOUS | 11 refills | Status: DC

## 2020-08-27 NOTE — Progress Notes (Signed)
NEUROLOGY FOLLOW UP OFFICE NOTE  Amber Stephenson [id number] 04-01-1946  HISTORY OF PRESENT ILLNESS: I had the pleasure of seeing Amber Stephenson in follow-up in the neurology clinic on 08/27/2020.  The patient was last seen a year ago for memory loss and headaches. She is again accompanied by her husband who helps supplement the history today. She is not feeling well today, she is having a lot of back pain and continues to see Pain Management and Ortho. She is on Pregabalin 200mg  BID and Dilaudid. She is also on lorazepam, Abilify, carbamazepine. SHe feels her memory is good, "I take Prevagen." Her husband reports good and bad days, every now and then she searches for a word. He fixes her pillbox, she takes them by herself and denies missing doses. She does not drive. Her husband manages finances. She continues to report frequent headaches around 3 times a week in the frontal region. She is sensitive to lights and sounds, no nausea/vomiting. She was started on Topiramate on last visit but has not continued it.    History on Initial Assessment 07/17/2017: This is a 75 year old right-handed woman with a history of hypertension, hyperlipidemia, diabetes, atrial fibrillation, chronic pain, bipolar disorder, presenting for evaluation of memory changes. She feels her memory has gotten really bad over the past 2 months. She cannot remember words/word-finding difficulties. She "does not think it's Alzheimers" but feels like she is not paying attention. Family reminds her that they had told her something already previously. Her husband has to remind her of her medications. They put her pills together in a pillbox. Her husband is in charge of finances. She stopped driving 4 years ago, she denied getting lost when she used to drive. She started noticing memory changes after she was started on a medication after she had her colonoscopy. She has also been dealing with a lot of pain, she states her Percocet dose was  cut in half a month ago and this is causing her a lot of issues. She has poor sleep, 3 hours at the most. She was recently started on Trazodone by her psychiatrist, which may be helping. Her father had Alzheimer's disease. She denies any history of significant head injuries, no alcohol use.  She has frequent headaches, around 20 headache days a month with stabbing pain in the frontal region that can last up to 3 days, with associated nausea/vomiting. If she takes medication, pain lasts up to 2 hours. She has dizziness at least 1-2 times a day where it feels like the room is spinning. She has had falls for "no clear reason," last fall was 3 months ago. She has chronic neck and back pain, no focal numbness/tingling/weakness. She has tremors in both hands and has a diagnosis of essential tremor, it is "very difficult to do things," it has affected her handwriting, computer use. No family history of tremors. No bowel/bladder dysfunction or anosmia.   I personally reviewed MRI brain without contrast done 05/13/17 which did not show any acute changes. There was mild diffuse atrophy and mild to moderate chronic microvascular disease. Neuropsychological testing in 04/2019 showed primary areas of impairment with processing speed and attention/concentration. 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 She also scored in the mild range of anxiety and depression, and it was felt that the combination of these with daily headaches, dizziness, sleep apnea, and chronic pain, negatively influence day to day cognitive function.  PAST MEDICAL HISTORY: Past Medical History:  Diagnosis Date  . Acute on chronic respiratory failure with hypoxia ([place]) 01/22/2019  . Atrial fibrillation with RVR 05/19/2017  . Bipolar I disorder 01/23/2015  . CAD (coronary artery disease)    Nonobstructive; Managed by Dr. Bronson Ing  .  Cardiomegaly 01/12/2018  . Chronic anticoagulation 05/30/2015  . Chronic atrial fibrillation ([place]) 01/04/2015  . Chronic back pain    Lower back  . Chronic diastolic CHF (congestive heart failure) 05/30/2015  . Diabetes mellitus, type 2, without complication   . Diabetic neuropathy 02/06/2016  . Dysrhythmia    A-Fib  . Essential hypertension   . Herpes genitalis in women 07/16/2015  . Hyperlipidemia   . Hypoxia 02/01/2019  . Insomnia 01/23/2015  . Lipoma 02/08/2015  . Major depressive disorder   . Migraine headache with aura 02/12/2016  . 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[place]) 04/21/2019  . NAFL (nonalcoholic fatty liver) 5/88/5027  . OSA (obstructive sleep apnea) 02/24/2019   10/09/2018 - HST  - AHI 40.6   . Pulmonary hypertension   . Renal insufficiency    Managed by Dr. Wallace Keller  . RLS (restless legs syndrome) 04/27/2015    MEDICATIONS: Current Outpatient Medications on File Prior to Visit  Medication Sig Dispense Refill  . acetaminophen (TYLENOL) 325 MG tablet Take 2 tablets (650 mg total) by mouth every 6 (six) hours as needed for mild pain, fever or headache (or Fever >/= 101). 15 tablet 1  . albuterol (PROVENTIL) (2.5 MG/3ML) 0.083% nebulizer solution Take 3 mLs (2.5 mg total) by nebulization every 6 (six) hours as needed for wheezing or shortness of breath. 150 mL 0  . albuterol (VENTOLIN HFA) 108 (90 Base) MCG/ACT inhaler Inhale 2 puffs into the lungs every 6 (six) hours as needed for wheezing or shortness of breath. 1 each 0  . apixaban (ELIQUIS) 5 MG TABS tablet Take 1 tablet (5 mg total) by mouth 2 (two) times daily. 60 tablet 0  . ARIPiprazole (ABILIFY) 10 MG tablet Take 3 tablets (30 mg total) by mouth daily. 90 tablet 0  . atorvastatin (LIPITOR) 40 MG tablet Take 1 tablet (40 mg total) by mouth daily. 30 tablet 0  . Balsam [place]-Castor Oil (VENELEX) OINT Apply topically. Special Instructions: Apply to coccyx and bilateral buttocks q shift for prevention/ redness.    .  carbamazepine (CARBATROL) 100 MG 12 hr capsule Take by mouth at bedtime.    Marland Kitchen denosumab (PROLIA) 60 MG/ML SOSY injection Inject 60 mg into the skin every 6 (six) months. 1 mL 1  . diltiazem (CARDIZEM CD) 120 MG 24 hr capsule Take 1 capsule (120 mg total) by mouth at bedtime. 30 capsule 0  . escitalopram (LEXAPRO) 10 MG tablet Take 1 tablet (10 mg total) by mouth daily. 30 tablet 0  . Fluticasone-Salmeterol (ADVAIR DISKUS) 500-50 MCG/DOSE AEPB Inhale 1 puff into the lungs in the morning and at bedtime. 60 each 0  . furosemide (LASIX) 40 MG tablet TAKE 1 TABLET EVERY MORNING 30 tablet 0  . HYDROmorphone (DILAUDID) 2 MG tablet Take 1 tablet (2 mg total) by mouth every 6 (six) hours as needed for severe pain. 10 tablet 0  . icosapent Ethyl (VASCEPA) 1 g capsule TAKE 2 CAPSULES TWICE A DAY WITH MEALS 120 capsule 0  . insulin glargine (LANTUS SOLOSTAR) 100 UNIT/ML Solostar Pen Inject 50 Units into the skin 2 (two) times daily. 30 mL 0  . insulin lispro (HUMALOG KWIKPEN) 100 UNIT/ML KwikPen With each meal check glucose.  If glucose is <100. Use 0 units. 101-200 use 5 units. Over 200 use 10 units 15 mL 3  . LORazepam (ATIVAN) 1 MG tablet Take 1 tablet (1 mg total) by mouth 2 (two) times daily. And give 2 tablets by mouth at bedtime 90 tablet 0  . meclizine (ANTIVERT) 12.5 MG tablet Take 1 tablet (12.5 mg total) by mouth 3 (three) times daily as needed for dizziness. 30 tablet 0  . metFORMIN (GLUCOPHAGE) 500 MG tablet Take 1 tablet (500 mg total) by mouth 2 (two) times daily after a meal. 60 tablet 0  . methocarbamol (ROBAXIN) 500 MG tablet Take 1 tablet (500 mg total) by mouth 3 (three) times daily as needed. 90 tablet 0  . metoprolol succinate (TOPROL-XL) 100 MG 24 hr tablet Take 1 tablet (100 mg total) by mouth in the morning and at bedtime. 60 tablet 0  . metoprolol tartrate (LOPRESSOR) 25 MG tablet Take 1 tablet (25 mg total) by mouth 2 (two) times daily. 60 tablet 0  . Multiple Vitamin (MULTIVITAMIN WITH  MINERALS) TABS tablet Take 1 tablet by mouth daily. 30 tablet 3  . NON FORMULARY Diet: _____ Regular, ___  __ NAS, ___x____Consistent Carbohydrate, _______NPO _____Other    . polyethylene glycol (MIRALAX / GLYCOLAX) 17 g packet Take 17 g by mouth daily. 14 each 0  . potassium chloride SA (KLOR-CON) 20 MEQ tablet Take 1 tablet (20 mEq total) by mouth daily. 30 tablet 0  . pregabalin (LYRICA) 200 MG capsule Take 1 capsule (200 mg total) by mouth 2 (two) times daily. 60 capsule 0  . RYBELSUS 7 MG TABS Take 7 mg by mouth daily at 12 noon. 30 tablet 0  . Vitamin D, Cholecalciferol, 25 MCG (1000 UT) TABS Take 1 tablet by mouth daily in the afternoon.     Current Facility-Administered Medications on File Prior to Visit  Medication Dose Route Frequency Provider Last Rate Last Admin  . bupivacaine-epinephrine (MARCAINE W/ EPI) 0.5% -1:200000 injection    Anesthesia Intra-op Effie Berkshire, MD   12 mL at 01/21/19 0935    ALLERGIES: Allergies  Allergen Reactions  . Ivp Dye [Iodinated Diagnostic Agents] Anaphylaxis and Swelling    Throat closes   . Codeine Other (See Comments)  . Iodine Other (See Comments)  . Latex     Latex tape pulls skin with it    FAMILY HISTORY: Family History  Problem Relation Age of Onset  . Diabetes Mother   . Heart disease Mother   . Heart disease Brother   . Heart disease Sister        CABG  . Diabetes Sister   . Alzheimer's disease Father   . Alcohol abuse Sister   . Mental illness Brother   . Diabetes Brother     SOCIAL HISTORY: Social History   Socioeconomic History  . Marital status: Married    Spouse name: Orpah Greek  . Number of children: 3  . Years of education: 80  . Highest education level: Bachelor's degree (e.g., BA, AB, BS)  Occupational History  . Occupation: Retired    Comment: marketing  Tobacco Use  . Smoking status: Never Smoker  . Smokeless tobacco: Never Used  Vaping Use  . Vaping Use: Never used  Substance and Sexual  Activity  . Alcohol use: Not Currently    Alcohol/week: 0.0 standard drinks  . Drug use: No  . Sexual activity: Yes    Partners: Male    Birth control/protection: Post-menopausal  Other Topics  Concern  . Not on file  Social History Narrative   Lives at home with husband.       Right handed   Social Determinants of Health   Financial Resource Strain: Not on file  Food Insecurity: Not on file  Transportation Needs: Not on file  Physical Activity: Not on file  Stress: Not on file  Social Connections: Not on file  Intimate Partner Violence: Not on file     PHYSICAL EXAM: Vitals:   08/27/20 1528  BP: 140/82  Pulse: 70  Resp: 18  SpO2: 99%   General: No acute distress Head:  Normocephalic/atraumatic Skin/Extremities: No rash, no edema Neurological Exam: alert and oriented to person, place, and time. No aphasia or dysarthria. Fund of knowledge is appropriate.  Recent and remote memory are intact.  Attention and concentration are reduced. MMSE 25/30 MMSE - Mini Mental State Exam 08/27/2020 01/18/2018 09/22/2017  Orientation to time 4 5 5   Orientation to Place 5 5 5   Registration 3 3 3   Attention/ Calculation 1 3 5   Recall 3 3 3   Language- name 2 objects 2 2 2   Language- repeat 1 1 1   Language- follow 3 step command 3 3 3   Language- read & follow direction 1 1 1   Write a sentence 1 1 1   Copy design 1 1 1   Total score 25 28 30    Cranial nerves: Pupils equal, round. Extraocular movements intact with no nystagmus.  No facial asymmetry.  Motor: moves all extremities symmetrically.Gait slow and cautious due to pain. She has a side to side head tremor, then has a yes tremor when writing.    IMPRESSION: This is a 75 yo RH woman with a history of hypertension, hyperlipidemia, diabetes, atrial fibrillation, chronic pain, bipolar disorder, who presented for memory changes. Neurocognitive testing indicated Mild Vascular Neurocognitive disorder (ie MCI). MMSE today 25/30. We again  discussed continued control of vascular risk factors, physical exercise, and brain stimulation exercises. She continues to report frequent headaches, she is on a significant amount of PO medications, we discussed starting a monthly injection, Emgality, for migraine prophylaxis, side effects discussed. First doses given today. Follow-up in 6 months, call for any changes.    Thank you for allowing me to participate in her care.  Please do not hesitate to call for any questions or concerns.   Ellouise Newer, M.D.   CC: Dr. Livia Snellen

## 2020-08-27 NOTE — Patient Instructions (Signed)
1. Start Emgality monthly injections to help cut down on migraines  2. Continue on all your other medications  3.Continue with physical therapy  4. Follow-up in 6 months, call for any changes

## 2020-08-29 DIAGNOSIS — E1165 Type 2 diabetes mellitus with hyperglycemia: Secondary | ICD-10-CM | POA: Diagnosis not present

## 2020-08-29 DIAGNOSIS — M461 Sacroiliitis, not elsewhere classified: Secondary | ICD-10-CM | POA: Diagnosis not present

## 2020-08-29 DIAGNOSIS — I110 Hypertensive heart disease with heart failure: Secondary | ICD-10-CM | POA: Diagnosis not present

## 2020-08-29 DIAGNOSIS — I5032 Chronic diastolic (congestive) heart failure: Secondary | ICD-10-CM | POA: Diagnosis not present

## 2020-08-29 DIAGNOSIS — E1140 Type 2 diabetes mellitus with diabetic neuropathy, unspecified: Secondary | ICD-10-CM | POA: Diagnosis not present

## 2020-08-29 DIAGNOSIS — G894 Chronic pain syndrome: Secondary | ICD-10-CM | POA: Diagnosis not present

## 2020-08-30 DIAGNOSIS — E1140 Type 2 diabetes mellitus with diabetic neuropathy, unspecified: Secondary | ICD-10-CM | POA: Diagnosis not present

## 2020-08-30 DIAGNOSIS — I110 Hypertensive heart disease with heart failure: Secondary | ICD-10-CM | POA: Diagnosis not present

## 2020-08-30 DIAGNOSIS — I5032 Chronic diastolic (congestive) heart failure: Secondary | ICD-10-CM | POA: Diagnosis not present

## 2020-08-30 DIAGNOSIS — E1165 Type 2 diabetes mellitus with hyperglycemia: Secondary | ICD-10-CM | POA: Diagnosis not present

## 2020-08-30 DIAGNOSIS — G894 Chronic pain syndrome: Secondary | ICD-10-CM | POA: Diagnosis not present

## 2020-08-30 DIAGNOSIS — M461 Sacroiliitis, not elsewhere classified: Secondary | ICD-10-CM | POA: Diagnosis not present

## 2020-08-31 ENCOUNTER — Other Ambulatory Visit: Payer: Self-pay

## 2020-08-31 ENCOUNTER — Ambulatory Visit (INDEPENDENT_AMBULATORY_CARE_PROVIDER_SITE_OTHER): Payer: Medicare Other | Admitting: Student

## 2020-08-31 ENCOUNTER — Encounter: Payer: Self-pay | Admitting: Student

## 2020-08-31 VITALS — BP 124/86 | HR 56 | Ht 62.0 in | Wt 196.2 lb

## 2020-08-31 DIAGNOSIS — E785 Hyperlipidemia, unspecified: Secondary | ICD-10-CM

## 2020-08-31 DIAGNOSIS — I4821 Permanent atrial fibrillation: Secondary | ICD-10-CM

## 2020-08-31 DIAGNOSIS — I5032 Chronic diastolic (congestive) heart failure: Secondary | ICD-10-CM

## 2020-08-31 DIAGNOSIS — I2510 Atherosclerotic heart disease of native coronary artery without angina pectoris: Secondary | ICD-10-CM | POA: Diagnosis not present

## 2020-08-31 DIAGNOSIS — I10 Essential (primary) hypertension: Secondary | ICD-10-CM | POA: Diagnosis not present

## 2020-08-31 MED ORDER — FUROSEMIDE 20 MG PO TABS: 10.0000 mg | ORAL_TABLET | Freq: Every day | ORAL | Status: DC

## 2020-08-31 MED ORDER — METOPROLOL SUCCINATE ER 25 MG PO TB24: 25.0000 mg | ORAL_TABLET | Freq: Two times a day (BID) | ORAL | 3 refills | Status: DC

## 2020-08-31 NOTE — Patient Instructions (Signed)
Medication Instructions:  Your physician has recommended you make the following change in your medication:   Decrease Lasix to 10 mg Daily   *If you need a refill on your cardiac medications before your next appointment, please call your pharmacy*   Lab Work: NONE  If you have labs (blood work) drawn today and your tests are completely normal, you will receive your results only by: Marland Kitchen MyChart Message (if you have MyChart) OR . A paper copy in the mail If you have any lab test that is abnormal or we need to change your treatment, we will call you to review the results.   Testing/Procedures: NONE    Follow-Up: At [facility], you and your health needs are our priority.  As part of our continuing mission to provide you with exceptional heart care, we have created designated Provider Care Teams.  These Care Teams include your primary Cardiologist (physician) and Advanced Practice Providers (APPs -  Physician Assistants and Nurse Practitioners) who all work together to provide you with the care you need, when you need it.  We recommend signing up for the patient portal called "MyChart".  Sign up information is provided on this After Visit Summary.  MyChart is used to connect with patients for Virtual Visits (Telemedicine).  Patients are able to view lab/test results, encounter notes, upcoming appointments, etc.  Non-urgent messages can be sent to your provider as well.   To learn more about what you can do with MyChart, go to [email or web address].    Your next appointment:   6 month(s)  The format for your next appointment:   In Person  Provider:   Bernerd Pho, PA-C   Other Instructions Thank you for choosing [facility]!

## 2020-08-31 NOTE — Progress Notes (Signed)
Cardiology Office Note    Date:  08/31/2020   ID:  [name], DOB 1945-09-11, MRN [id number]  PCP:  [name], MD  Cardiologist: Kate Sable, MD (Inactive)  --> Needs to switch to new MD  Chief Complaint  Patient presents with  . Follow-up    6 month visit    History of Present Illness:    Amber Stephenson is a 75 y.o. female with past medical history of permanent atrial fibrillation (on Eliquis), chronic diastolic CHF, HTN, HLD, Type 2 DM, Stage 2-3 CKD, COPD, OSA (intolerant to CPAP), Bipolar Disorder and CAD (nonobstructive CAD by cath in 01/2018) who presents for a 2-month follow-up visit.  She was last examined by myself in 02/2020 and reported intermittent dyspnea which had overall been stable and typically improved with the use of her nebulizer or inhaler. She denied any recent chest pain or palpitations. She was continued on her current regimen.   By review of notes, she was hospitalized at [facility] in 04/2020 for evaluation of frequent falls and was discharged to SNF for continued PT. She again presented back to the ED in 07/2020 for recurrent back pain after having returned home in the interim. Was again referred to SNF for continued rehab and discharged in 07/2020.  In talking with the patient and her husband today, her main concern at this time is chronic back pain which has persisted despite multiple changes in her pain medications. She is followed by pain management in [place]. She also reports working with [facility] since discharge from SNF and feels like this is helping with her ambulation. She reports her breathing has been stable and denies any recent orthopnea, PND or lower extremity edema. No recent chest pain or palpitations. She does experience frequent urination with Lasix.   Past Medical History:  Diagnosis Date  . Acute on chronic respiratory failure with hypoxia ([place]) 01/22/2019  . Atrial fibrillation with RVR 05/19/2017  . Bipolar  I disorder 01/23/2015  . CAD (coronary artery disease)    Nonobstructive; Managed by Dr. Bronson Ing  . Cardiomegaly 01/12/2018  . Chronic anticoagulation 05/30/2015  . Chronic atrial fibrillation ([place]) 01/04/2015  . Chronic back pain    Lower back  . Chronic diastolic CHF (congestive heart failure) 05/30/2015  . Diabetes mellitus, type 2, without complication   . Diabetic neuropathy 02/06/2016  . Dysrhythmia    A-Fib  . Essential hypertension   . Herpes genitalis in women 07/16/2015  . Hyperlipidemia   . Hypoxia 02/01/2019  . Insomnia 01/23/2015  . Lipoma 02/08/2015  . Major depressive disorder   . Migraine headache with aura 02/12/2016  . 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[place]) 04/21/2019  . NAFL (nonalcoholic fatty liver) 4/76/5465  . OSA (obstructive sleep apnea) 02/24/2019   10/09/2018 - HST  - AHI 40.6   . Pulmonary hypertension   . Renal insufficiency    Managed by Dr. Wallace Keller  . RLS (restless legs syndrome) 04/27/2015    Past Surgical History:  Procedure Laterality Date  . BREAST REDUCTION SURGERY    . EYE SURGERY Right    cateracts  . HAMMER TOE SURGERY    . LEFT HEART CATH AND CORONARY ANGIOGRAPHY N/A 02/03/2018   Procedure: LEFT HEART CATH AND CORONARY ANGIOGRAPHY;  Surgeon: Martinique, Peter M, MD;  Location: [facility];  Service: Cardiovascular;  Laterality: N/A;  . REVERSE SHOULDER ARTHROPLASTY Right 08/19/2019   Procedure: REVERSE SHOULDER ARTHROPLASTY;  Surgeon: Netta Cedars, MD;  Location: [facility];  Service: Orthopedics;  Laterality: Right;  interscalene block  . SHOULDER SURGERY Right    "I BROKE MY SHOUDLER  . THIGH SURGERY     "TO REMOVE A TUMOR "    Current Medications: Outpatient Medications Prior to Visit  Medication Sig Dispense Refill  . albuterol (PROVENTIL) (2.5 MG/3ML) 0.083% nebulizer solution Take 3 mLs (2.5 mg total) by nebulization every 6 (six) hours as needed for wheezing or shortness of breath. 150 mL 0  . albuterol (VENTOLIN HFA) 108 (90  Base) MCG/ACT inhaler Inhale 2 puffs into the lungs every 6 (six) hours as needed for wheezing or shortness of breath. 1 each 0  . apixaban (ELIQUIS) 5 MG TABS tablet Take 1 tablet (5 mg total) by mouth 2 (two) times daily. 60 tablet 0  . ARIPiprazole (ABILIFY) 10 MG tablet Take 3 tablets (30 mg total) by mouth daily. 90 tablet 0  . atorvastatin (LIPITOR) 40 MG tablet Take 1 tablet (40 mg total) by mouth daily. 30 tablet 0  . Balsam [place]-Castor Oil (VENELEX) OINT Apply topically. Special Instructions: Apply to coccyx and bilateral buttocks q shift for prevention/ redness.    . carbamazepine (CARBATROL) 100 MG 12 hr capsule Take by mouth at bedtime.    Marland Kitchen denosumab (PROLIA) 60 MG/ML SOSY injection Inject 60 mg into the skin every 6 (six) months. 1 mL 1  . diltiazem (CARDIZEM CD) 120 MG 24 hr capsule Take 1 capsule (120 mg total) by mouth at bedtime. 30 capsule 0  . escitalopram (LEXAPRO) 10 MG tablet Take 1 tablet (10 mg total) by mouth daily. 30 tablet 0  . Fluticasone-Salmeterol (ADVAIR DISKUS) 500-50 MCG/DOSE AEPB Inhale 1 puff into the lungs in the morning and at bedtime. 60 each 0  . Galcanezumab-gnlm (EMGALITY) 120 MG/ML SOAJ Administer once a month starting 09/27/2020 1.12 mL 11  . HYDROmorphone (DILAUDID) 2 MG tablet Take 1 tablet (2 mg total) by mouth every 6 (six) hours as needed for severe pain. 10 tablet 0  . icosapent Ethyl (VASCEPA) 1 g capsule TAKE 2 CAPSULES TWICE A DAY WITH MEALS 120 capsule 0  . insulin glargine (LANTUS SOLOSTAR) 100 UNIT/ML Solostar Pen Inject 50 Units into the skin 2 (two) times daily. 30 mL 0  . insulin lispro (HUMALOG KWIKPEN) 100 UNIT/ML KwikPen With each meal check glucose. If glucose is <100. Use 0 units. 101-200 use 5 units. Over 200 use 10 units 15 mL 3  . LORazepam (ATIVAN) 1 MG tablet Take 1 tablet (1 mg total) by mouth 2 (two) times daily. And give 2 tablets by mouth at bedtime 90 tablet 0  . meclizine (ANTIVERT) 12.5 MG tablet Take 1 tablet (12.5 mg total)  by mouth 3 (three) times daily as needed for dizziness. 30 tablet 0  . metFORMIN (GLUCOPHAGE) 500 MG tablet Take 1 tablet (500 mg total) by mouth 2 (two) times daily after a meal. 60 tablet 0  . methocarbamol (ROBAXIN) 500 MG tablet Take 1 tablet (500 mg total) by mouth 3 (three) times daily as needed. 90 tablet 0  . metoprolol succinate (TOPROL-XL) 100 MG 24 hr tablet Take 1 tablet (100 mg total) by mouth in the morning and at bedtime. 60 tablet 0  . NON FORMULARY Diet: _____ Regular, ___  __ NAS, ___x____Consistent Carbohydrate, _______NPO _____Other    . polyethylene glycol (MIRALAX / GLYCOLAX) 17 g packet Take 17 g by mouth daily. 14 each 0  . potassium chloride SA (KLOR-CON) 20 MEQ tablet Take 1 tablet (  20 mEq total) by mouth daily. 30 tablet 0  . pregabalin (LYRICA) 200 MG capsule Take 1 capsule (200 mg total) by mouth 2 (two) times daily. 60 capsule 0  . RYBELSUS 7 MG TABS Take 7 mg by mouth daily at 12 noon. 30 tablet 0  . Vitamin D, Cholecalciferol, 25 MCG (1000 UT) TABS Take 1 tablet by mouth daily in the afternoon.    . furosemide (LASIX) 40 MG tablet TAKE 1 TABLET EVERY MORNING 30 tablet 0  . metoprolol tartrate (LOPRESSOR) 25 MG tablet Take 1 tablet (25 mg total) by mouth 2 (two) times daily. 60 tablet 0  . acetaminophen (TYLENOL) 325 MG tablet Take 2 tablets (650 mg total) by mouth every 6 (six) hours as needed for mild pain, fever or headache (or Fever >/= 101). 15 tablet 1  . Multiple Vitamin (MULTIVITAMIN WITH MINERALS) TABS tablet Take 1 tablet by mouth daily. 30 tablet 3   Facility-Administered Medications Prior to Visit  Medication Dose Route Frequency Provider Last Rate Last Admin  . bupivacaine-epinephrine (MARCAINE W/ EPI) 0.5% -1:200000 injection    Anesthesia Intra-op Effie Berkshire, MD   12 mL at 01/21/19 0935     Allergies:   Ivp dye [iodinated diagnostic agents], Codeine, Iodine, and Latex   Social History   Socioeconomic History  . Marital status: Married     Spouse name: Orpah Greek  . Number of children: 3  . Years of education: 40  . Highest education level: Bachelor's degree (e.g., BA, AB, BS)  Occupational History  . Occupation: Retired    Comment: marketing  Tobacco Use  . Smoking status: Never Smoker  . Smokeless tobacco: Never Used  Vaping Use  . Vaping Use: Never used  Substance and Sexual Activity  . Alcohol use: Not Currently    Alcohol/week: 0.0 standard drinks  . Drug use: No  . Sexual activity: Yes    Partners: Male    Birth control/protection: Post-menopausal  Other Topics Concern  . Not on file  Social History Narrative   Lives at home with husband.       Right handed   Social Determinants of Health   Financial Resource Strain: Not on file  Food Insecurity: Not on file  Transportation Needs: Not on file  Physical Activity: Not on file  Stress: Not on file  Social Connections: Not on file     Family History:  The patient's family history includes Alcohol abuse in her sister; Alzheimer's disease in her father; Diabetes in her brother, mother, and sister; Heart disease in her brother, mother, and sister; Mental illness in her brother.   Review of Systems:   Please see the history of present illness.     General:  No chills, fever, night sweats or weight changes. Positive for back pain.  Cardiovascular:  No chest pain, dyspnea on exertion, edema, orthopnea, palpitations, paroxysmal nocturnal dyspnea. Dermatological: No rash, lesions/masses Respiratory: No cough, dyspnea Urologic: No hematuria, dysuria Abdominal:   No nausea, vomiting, diarrhea, bright red blood per rectum, melena, or hematemesis Neurologic:  No visual changes, wkns, changes in mental status. All other systems reviewed and are otherwise negative except as noted above.   Physical Exam:    VS:  BP 124/86   Pulse (!) 56   Ht 5\' 2"  (1.575 m)   Wt 196 lb 3.2 oz (89 kg)   SpO2 98%   BMI 35.89 kg/m    General: Well developed, well  nourished,female appearing in no acute distress.  Head: Normocephalic, atraumatic. Neck: No carotid bruits. JVD not elevated.  Lungs: Respirations regular and unlabored, without wheezes or rales.  Heart: Irregularly irregular. No S3 or S4.  No murmur, no rubs, or gallops appreciated. Abdomen: Appears non-distended. No obvious abdominal masses. Msk:  Strength and tone appear normal for age. No obvious joint deformities or effusions. Extremities: No clubbing or cyanosis. No edema.  Distal pedal pulses are 2+ bilaterally. Neuro: Alert and oriented X 3. Moves all extremities spontaneously. No focal deficits noted. Psych:  Responds to questions appropriately with a normal affect. Skin: No rashes or lesions noted  Wt Readings from Last 3 Encounters:  08/31/20 196 lb 3.2 oz (89 kg)  08/27/20 198 lb (89.8 kg)  08/14/20 204 lb 3.2 oz (92.6 kg)     Studies/Labs Reviewed:   EKG:  EKG is not ordered today.   Recent Labs: 07/25/2020: ALT 15; Hemoglobin 14.4; Platelets 173 08/09/2020: BUN 22; Creatinine, Ser 0.70; Potassium 3.6; Sodium 140   Lipid Panel    Component Value Date/Time   CHOL 128 08/29/2019 1048   TRIG 160 (H) 08/29/2019 1048   HDL 39 (L) 08/29/2019 1048   CHOLHDL 3.3 08/29/2019 1048   LDLCALC 62 08/29/2019 1048   LDLDIRECT 62 03/17/2016 1614    Additional studies/ records that were reviewed today include:   Holter Monitor: 2017  Patient is in atrial fibrillation throughout study  Rare ventricular ectopy, primarily in the form of isolated PVCs  Min HR 44, Max HR 124, Avg HR 79.  2.2 second pauses noted in early AM hours presumably while sleeping  No diary submitted  Overall rate controlled atrial fibrillation. No other significant arrhythmias.  Cardiac Catheterization: 01/2018  Prox LAD lesion is 40% stenosed.  Mid LAD lesion is 30% stenosed.  Ost 1st Mrg to 1st Mrg lesion is 30% stenosed.  LV end diastolic pressure is moderately elevated.   1.  Nonobstructive CAD with left dominant circulation 2. Moderately elevated LVEDP (post hydration)  Plan: medical therapy. Will resume Eliquis this pm. Continue diuretic therapy.  No indication for antiplatelet therapy at this time.  Echocardiogram: 01/2019 IMPRESSIONS    1. The left ventricle has normal systolic function, with an ejection  fraction of 55-60%. The cavity size was normal. There is mildly increased  left ventricular wall thickness. Left ventricular diastolic function could  not be evaluated secondary to  atrial fibrillation.  2. The right ventricle has normal systolic function. The cavity was  normal. There is no increase in right ventricular wall thickness.  3. The mitral valve is grossly normal.  4. The aortic valve is grossly normal. Aortic valve regurgitation is mild  by color flow Doppler.  5. The aorta is normal unless otherwise noted.  6. The aortic root and ascending aorta are normal in size and structure.  7. The atrial septum is grossly normal.    Assessment:    1. Permanent atrial fibrillation ([place])   2. Chronic diastolic CHF (congestive heart failure) ([place])   3. Coronary artery disease involving native coronary artery of native heart without angina pectoris   4. Essential hypertension   5. Hyperlipidemia LDL goal <70      Plan:   In order of problems listed above:  1. Permanent Atrial Fibrillation - She denies any recent palpitations and heart rate is well controlled in the 50's to 60's during examination today. Continue Cardizem CD 120 mg daily along with Toprol-XL 125 mg twice daily for rate control. - She denies any evidence  of active bleeding. Continue Eliquis 5 mg twice daily for anticoagulation. Labs in 07/2020 showed hemoglobin was stable at 14.4 with platelets at 173.  2. Chronic Diastolic CHF - She denies any recent orthopnea, PND or lower extremity edema and appears euvolemic on examination today. She does experience frequent  urination with Lasix and has already reduced this to 20 mg daily. I recommended that she further cut this to 10 mg daily. If no recurrent fluid issues and weight remains stable, would only take on an as needed basis for edema or acute weight changes (> 2 lbs overnight or > 5 lbs in one week).   3. CAD - She had nonobstructive CAD by prior catheterization in 01/2018 as outlined above. She denies any recent anginal symptoms.  -  Continue Atorvastatin 40 mg daily and Toprol-XL. She is not on ASA given the need for anticoagulation.   4. HTN - BP is well controlled at 124/86 during today's visit. Continue current medication regimen with Cardizem CD 120 mg daily and Toprol-XL 125 mg daily (updated Rx to reflect Toprol-XL for both her 100mg  tablets and 25mg  tablets as this was changed while at SNF).   5. HLD - FLP in 08/2019 showed total cholesterol 128, triglycerides 160, HDL 39 and LDL 62. She remains on Atorvastatin 40 mg daily.   Medication Adjustments/Labs and Tests Ordered: Current medicines are reviewed at length with the patient today.  Concerns regarding medicines are outlined above.  Medication changes, Labs and Tests ordered today are listed in the Patient Instructions below. Patient Instructions  Medication Instructions:  Your physician has recommended you make the following change in your medication:   Decrease Lasix to 10 mg Daily   *If you need a refill on your cardiac medications before your next appointment, please call your pharmacy*   Lab Work: NONE  If you have labs (blood work) drawn today and your tests are completely normal, you will receive your results only by: Marland Kitchen MyChart Message (if you have MyChart) OR . A paper copy in the mail If you have any lab test that is abnormal or we need to change your treatment, we will call you to review the results.   Testing/Procedures: NONE    Follow-Up: At [facility], you and your health needs are our priority.  As part of  our continuing mission to provide you with exceptional heart care, we have created designated Provider Care Teams.  These Care Teams include your primary Cardiologist (physician) and Advanced Practice Providers (APPs -  Physician Assistants and Nurse Practitioners) who all work together to provide you with the care you need, when you need it.  We recommend signing up for the patient portal called "MyChart".  Sign up information is provided on this After Visit Summary.  MyChart is used to connect with patients for Virtual Visits (Telemedicine).  Patients are able to view lab/test results, encounter notes, upcoming appointments, etc.  Non-urgent messages can be sent to your provider as well.   To learn more about what you can do with MyChart, go to [email or web address].    Your next appointment:   6 month(s)  The format for your next appointment:   In Person  Provider:   Bernerd Pho, PA-C   Other Instructions Thank you for choosing [facility]!       Signed, Erma Heritage, PA-C  08/31/2020 4:58 PM    [facility] [address], [place] [address] Phone: [phone] Fax: [phone]  [phone]   

## 2020-09-05 ENCOUNTER — Other Ambulatory Visit: Payer: Self-pay

## 2020-09-05 ENCOUNTER — Encounter: Payer: Self-pay | Admitting: Family Medicine

## 2020-09-05 ENCOUNTER — Ambulatory Visit (INDEPENDENT_AMBULATORY_CARE_PROVIDER_SITE_OTHER): Payer: Medicare Other | Admitting: Family Medicine

## 2020-09-05 VITALS — BP 126/71 | HR 64 | Temp 97.5°F | Ht 62.0 in | Wt 195.8 lb

## 2020-09-05 DIAGNOSIS — E1142 Type 2 diabetes mellitus with diabetic polyneuropathy: Secondary | ICD-10-CM

## 2020-09-05 DIAGNOSIS — I2510 Atherosclerotic heart disease of native coronary artery without angina pectoris: Secondary | ICD-10-CM | POA: Diagnosis not present

## 2020-09-05 MED ORDER — METFORMIN HCL 500 MG PO TABS: 500.0000 mg | ORAL_TABLET | Freq: Two times a day (BID) | ORAL | 0 refills | Status: DC

## 2020-09-05 NOTE — Progress Notes (Signed)
Subjective:  Patient ID: Amber Stephenson, female    DOB: 06-17-1945  Age: 75 y.o. MRN: [id number]  CC: Referral   HPI Amber Stephenson presents for referral to endocrine. Doesn't like who she is seeing. Wants to switch. [place].  Seeing Dr. Almedia Balls now. Needs care for diabetes. Glucose is doing well. However recent A1c  Depression screen [facility] 2/9 09/05/2020 08/14/2020 06/13/2020  Decreased Interest 0 0 0  Down, Depressed, Hopeless 0 0 0  PHQ - 2 Score 0 0 0  Altered sleeping - - 3  Tired, decreased energy - - 2  Change in appetite - - 3  Feeling bad or failure about yourself  - - 0  Trouble concentrating - - 0  Moving slowly or fidgety/restless - - 0  Suicidal thoughts - - 0  PHQ-9 Score - - 8  Difficult doing work/chores - - -  Some recent data might be hidden    History Amber Stephenson has a past medical history of Acute on chronic respiratory failure with hypoxia ([place]) (01/22/2019), Atrial fibrillation with RVR (05/19/2017), Bipolar I disorder (01/23/2015), CAD (coronary artery disease), Cardiomegaly (01/12/2018), Chronic anticoagulation (05/30/2015), Chronic atrial fibrillation ([facility]) (01/04/2015), Chronic back pain, Chronic diastolic CHF (congestive heart failure) (05/30/2015), Diabetes mellitus, type 2, without complication, Diabetic neuropathy (02/06/2016), Dysrhythmia, Essential hypertension, Herpes genitalis in women (07/16/2015), Hyperlipidemia, Hypoxia (02/01/2019), Insomnia (01/23/2015), Lipoma (02/08/2015), Major depressive disorder, Migraine headache with aura (02/12/2016), 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[place]) (04/21/2019), NAFL (nonalcoholic fatty liver) (11/29/4763), OSA (obstructive sleep apnea) (02/24/2019), Pulmonary hypertension, Renal insufficiency, and RLS (restless legs syndrome) (04/27/2015).   She has a past surgical history that includes THIGH SURGERY; Shoulder surgery (Right); Breast reduction surgery; Eye surgery (Right); Hammer toe surgery; LEFT HEART CATH AND CORONARY  ANGIOGRAPHY (N/A, 02/03/2018); and Reverse shoulder arthroplasty (Right, 08/19/2019).   Her family history includes Alcohol abuse in her sister; Alzheimer's disease in her father; Diabetes in her brother, mother, and sister; Heart disease in her brother, mother, and sister; Mental illness in her brother.She reports that she has never smoked. She has never used smokeless tobacco. She reports previous alcohol use. She reports that she does not use drugs.    ROS Review of Systems  Constitutional: Negative.   HENT: Negative.   Eyes: Negative for visual disturbance.  Respiratory: Negative for shortness of breath.   Cardiovascular: Negative for chest pain.  Gastrointestinal: Negative for abdominal pain.  Musculoskeletal: Negative for arthralgias.  Allergic/Immunologic: Negative.     Objective:  BP 126/71   Pulse 64   Temp (!) 97.5 F (36.4 C)   Ht 5\' 2"  (1.575 m)   Wt 195 lb 12.8 oz (88.8 kg)   SpO2 93%   BMI 35.81 kg/m   BP Readings from Last 3 Encounters:  09/05/20 126/71  08/31/20 124/86  08/27/20 140/82    Wt Readings from Last 3 Encounters:  09/05/20 195 lb 12.8 oz (88.8 kg)  08/31/20 196 lb 3.2 oz (89 kg)  08/27/20 198 lb (89.8 kg)     Physical Exam Constitutional:      General: She is not in acute distress.    Appearance: She is not toxic-appearing.  HENT:     Head: Normocephalic.  Musculoskeletal:        General: Normal range of motion.     Cervical back: Normal range of motion.     Right lower leg: No edema.     Left lower leg: No edema.  Skin:    General: Skin is warm and  dry.  Neurological:     Mental Status: She is alert and oriented to person, place, and time. Mental status is at baseline.     Gait: Gait normal.       Assessment & Plan:   Amber Stephenson was seen today for referral.  Diagnoses and all orders for this visit:  Diabetic polyneuropathy associated with type 2 diabetes mellitus ([place]) -     Ambulatory referral to Endocrinology  Other  orders -     metFORMIN (GLUCOPHAGE) 500 MG tablet; Take 1 tablet (500 mg total) by mouth 2 (two) times daily after a meal.       I am having Amber Stephenson. Amber Stephenson maintain her polyethylene glycol, NON FORMULARY, Vitamin D (Cholecalciferol), Venelex, albuterol, albuterol, ARIPiprazole, atorvastatin, diltiazem, apixaban, escitalopram, Fluticasone-Salmeterol, HYDROmorphone, Lantus SoloStar, LORazepam, meclizine, methocarbamol, metoprolol succinate, potassium chloride SA, pregabalin, Rybelsus, icosapent Ethyl, insulin lispro, Prolia, carbamazepine, Emgality, furosemide, metoprolol succinate, and metFORMIN.  Allergies as of 09/05/2020      Reactions   Ivp Dye [iodinated Diagnostic Agents] Anaphylaxis, Swelling   Throat closes   Codeine Other (See Comments)   Iodine Other (See Comments)   Latex    Latex tape pulls skin with it      Medication List       Accurate as of September 05, 2020  5:53 PM. If you have any questions, ask your nurse or doctor.        albuterol (2.5 MG/3ML) 0.083% nebulizer solution Commonly known as: PROVENTIL Take 3 mLs (2.5 mg total) by nebulization every 6 (six) hours as needed for wheezing or shortness of breath.   albuterol 108 (90 Base) MCG/ACT inhaler Commonly known as: VENTOLIN HFA Inhale 2 puffs into the lungs every 6 (six) hours as needed for wheezing or shortness of breath.   apixaban 5 MG Tabs tablet Commonly known as: Eliquis Take 1 tablet (5 mg total) by mouth 2 (two) times daily.   ARIPiprazole 10 MG tablet Commonly known as: ABILIFY Take 3 tablets (30 mg total) by mouth daily.   atorvastatin 40 MG tablet Commonly known as: LIPITOR Take 1 tablet (40 mg total) by mouth daily.   carbamazepine 100 MG 12 hr capsule Commonly known as: CARBATROL Take by mouth at bedtime.   diltiazem 120 MG 24 hr capsule Commonly known as: CARDIZEM CD Take 1 capsule (120 mg total) by mouth at bedtime.   Emgality 120 MG/ML Soaj Generic drug:  Galcanezumab-gnlm Administer once a month starting 09/27/2020   escitalopram 10 MG tablet Commonly known as: Lexapro Take 1 tablet (10 mg total) by mouth daily.   Fluticasone-Salmeterol 500-50 MCG/DOSE Aepb Commonly known as: Advair Diskus Inhale 1 puff into the lungs in the morning and at bedtime.   furosemide 20 MG tablet Commonly known as: LASIX Take 0.5 tablets (10 mg total) by mouth daily. TAKE 1 TABLET EVERY MORNING   HYDROmorphone 2 MG tablet Commonly known as: Dilaudid Take 1 tablet (2 mg total) by mouth every 6 (six) hours as needed for severe pain.   icosapent Ethyl 1 g capsule Commonly known as: Vascepa TAKE 2 CAPSULES TWICE A DAY WITH MEALS   insulin lispro 100 UNIT/ML KwikPen Commonly known as: HumaLOG KwikPen With each meal check glucose. If glucose is <100. Use 0 units. 101-200 use 5 units. Over 200 use 10 units   Lantus SoloStar 100 UNIT/ML Solostar Pen Generic drug: insulin glargine Inject 50 Units into the skin 2 (two) times daily.   LORazepam 1 MG tablet Commonly known as:  ATIVAN Take 1 tablet (1 mg total) by mouth 2 (two) times daily. And give 2 tablets by mouth at bedtime   meclizine 12.5 MG tablet Commonly known as: ANTIVERT Take 1 tablet (12.5 mg total) by mouth 3 (three) times daily as needed for dizziness.   metFORMIN 500 MG tablet Commonly known as: GLUCOPHAGE Take 1 tablet (500 mg total) by mouth 2 (two) times daily after a meal.   methocarbamol 500 MG tablet Commonly known as: ROBAXIN Take 1 tablet (500 mg total) by mouth 3 (three) times daily as needed.   metoprolol succinate 100 MG 24 hr tablet Commonly known as: TOPROL-XL Take 1 tablet (100 mg total) by mouth in the morning and at bedtime.   metoprolol succinate 25 MG 24 hr tablet Commonly known as: Toprol XL Take 1 tablet (25 mg total) by mouth in the morning and at bedtime.   NON FORMULARY Diet: _____ Regular, ___  __ NAS, ___x____Consistent  Carbohydrate, _______NPO _____Other   polyethylene glycol 17 g packet Commonly known as: MIRALAX / GLYCOLAX Take 17 g by mouth daily.   potassium chloride SA 20 MEQ tablet Commonly known as: KLOR-CON Take 1 tablet (20 mEq total) by mouth daily.   pregabalin 200 MG capsule Commonly known as: Lyrica Take 1 capsule (200 mg total) by mouth 2 (two) times daily.   Prolia 60 MG/ML Sosy injection Generic drug: denosumab Inject 60 mg into the skin every 6 (six) months.   Rybelsus 7 MG Tabs Generic drug: Semaglutide Take 7 mg by mouth daily at 12 noon.   Venelex Oint Apply topically. Special Instructions: Apply to coccyx and bilateral buttocks q shift for prevention/ redness.   Vitamin D (Cholecalciferol) 25 MCG (1000 UT) Tabs Take 1 tablet by mouth daily in the afternoon.        Follow-up: Return in about 6 months (around 03/07/2021).  [name], M.D.

## 2020-09-06 DIAGNOSIS — M461 Sacroiliitis, not elsewhere classified: Secondary | ICD-10-CM | POA: Diagnosis not present

## 2020-09-06 DIAGNOSIS — I5032 Chronic diastolic (congestive) heart failure: Secondary | ICD-10-CM | POA: Diagnosis not present

## 2020-09-06 DIAGNOSIS — G894 Chronic pain syndrome: Secondary | ICD-10-CM | POA: Diagnosis not present

## 2020-09-06 DIAGNOSIS — E1165 Type 2 diabetes mellitus with hyperglycemia: Secondary | ICD-10-CM | POA: Diagnosis not present

## 2020-09-06 DIAGNOSIS — E1140 Type 2 diabetes mellitus with diabetic neuropathy, unspecified: Secondary | ICD-10-CM | POA: Diagnosis not present

## 2020-09-06 DIAGNOSIS — I110 Hypertensive heart disease with heart failure: Secondary | ICD-10-CM | POA: Diagnosis not present

## 2020-09-07 ENCOUNTER — Encounter: Payer: Self-pay | Admitting: Physical Medicine and Rehabilitation

## 2020-09-07 ENCOUNTER — Encounter
Payer: Medicare Other | Attending: Physical Medicine and Rehabilitation | Admitting: Physical Medicine and Rehabilitation

## 2020-09-07 ENCOUNTER — Other Ambulatory Visit: Payer: Self-pay

## 2020-09-07 VITALS — BP 114/72 | HR 59 | Temp 97.5°F | Ht 62.0 in | Wt 199.0 lb

## 2020-09-07 DIAGNOSIS — G8929 Other chronic pain: Secondary | ICD-10-CM | POA: Insufficient documentation

## 2020-09-07 DIAGNOSIS — M5442 Lumbago with sciatica, left side: Secondary | ICD-10-CM | POA: Insufficient documentation

## 2020-09-07 DIAGNOSIS — M5441 Lumbago with sciatica, right side: Secondary | ICD-10-CM

## 2020-09-07 DIAGNOSIS — R262 Difficulty in walking, not elsewhere classified: Secondary | ICD-10-CM | POA: Insufficient documentation

## 2020-09-07 DIAGNOSIS — G894 Chronic pain syndrome: Secondary | ICD-10-CM | POA: Insufficient documentation

## 2020-09-07 MED ORDER — HYDROMORPHONE HCL 2 MG PO TABS: 2.0000 mg | ORAL_TABLET | Freq: Four times a day (QID) | ORAL | 0 refills | Status: DC | PRN

## 2020-09-07 NOTE — Progress Notes (Signed)
Subjective:    Patient ID: Amber Stephenson, female    DOB: 14-Sep-1945, 75 y.o.   MRN: [id number]  HPI  Patient is a 75 yr old female with hx of  CAD,pulmonary HTN-and DM- with CKD- Stage III here for right low back pain with sciaticaAs well as upper back/neck pain/myofascial pain.  Here for f/u on chronic low back pain.  Did feel like changing to Dilaudid was helpful.   Was put back into the nursing home- went to nursing home/SNF - in hospital 2/24- to 2/25- and then went to SNF- got out of there 3/10.    Not still there- got out 3 weeks ago-  Was there for 3 weeks.  Just couldn't walk, due to pain- could barely move her legs to walk - had therapy while was there, and started feeling stronger and was able to go home.   One side of hip really hurts- either/or side. R side hurting right now.    In general, thinks Dilaudid has improved pain control.   No other side effects or constipation from the Dilaudid.   Walking makes things worse; and pain in R>L hip- in actuality, R lumbar paraspinals.    When went into hospital, they didn't give her a steroid; didn't change anything; just sent her to SNF.     Pain Inventory Average Pain 7 Pain Right Now 8 My pain is constant and sharp  In the last 24 hours, has pain interfered with the following? General activity 3 Relation with others 0 Enjoyment of life 7 What TIME of day is your pain at its worst? morning  Sleep (in general) Fair  Pain is worse with: bending Pain improves with: medication and TENS Relief from Meds: 5  Family History  Problem Relation Age of Onset  . Diabetes Mother   . Heart disease Mother   . Heart disease Brother   . Heart disease Sister        CABG  . Diabetes Sister   . Alzheimer's disease Father   . Alcohol abuse Sister   . Mental illness Brother   . Diabetes Brother    Social History   Socioeconomic History  . Marital status: Married    Spouse name: Orpah Greek  . Number of  children: 3  . Years of education: 63  . Highest education level: Bachelor's degree (e.g., BA, AB, BS)  Occupational History  . Occupation: Retired    Comment: marketing  Tobacco Use  . Smoking status: Never Smoker  . Smokeless tobacco: Never Used  Vaping Use  . Vaping Use: Never used  Substance and Sexual Activity  . Alcohol use: Not Currently    Alcohol/week: 0.0 standard drinks  . Drug use: No  . Sexual activity: Yes    Partners: Male    Birth control/protection: Post-menopausal  Other Topics Concern  . Not on file  Social History Narrative   Lives at home with husband.       Right handed   Social Determinants of Health   Financial Resource Strain: Not on file  Food Insecurity: Not on file  Transportation Needs: Not on file  Physical Activity: Not on file  Stress: Not on file  Social Connections: Not on file   Past Surgical History:  Procedure Laterality Date  . BREAST REDUCTION SURGERY    . EYE SURGERY Right    cateracts  . HAMMER TOE SURGERY    . LEFT HEART CATH AND CORONARY ANGIOGRAPHY N/A 02/03/2018   Procedure:  LEFT HEART CATH AND CORONARY ANGIOGRAPHY;  Surgeon: Martinique, Peter M, MD;  Location: [facility];  Service: Cardiovascular;  Laterality: N/A;  . REVERSE SHOULDER ARTHROPLASTY Right 08/19/2019   Procedure: REVERSE SHOULDER ARTHROPLASTY;  Surgeon: Netta Cedars, MD;  Location: [facility];  Service: Orthopedics;  Laterality: Right;  interscalene block  . SHOULDER SURGERY Right    "I BROKE MY SHOUDLER  . THIGH SURGERY     "TO REMOVE A TUMOR "   Past Surgical History:  Procedure Laterality Date  . BREAST REDUCTION SURGERY    . EYE SURGERY Right    cateracts  . HAMMER TOE SURGERY    . LEFT HEART CATH AND CORONARY ANGIOGRAPHY N/A 02/03/2018   Procedure: LEFT HEART CATH AND CORONARY ANGIOGRAPHY;  Surgeon: Martinique, Peter M, MD;  Location: [facility];  Service: Cardiovascular;  Laterality: N/A;  . REVERSE SHOULDER ARTHROPLASTY Right 08/19/2019    Procedure: REVERSE SHOULDER ARTHROPLASTY;  Surgeon: Netta Cedars, MD;  Location: [facility];  Service: Orthopedics;  Laterality: Right;  interscalene block  . SHOULDER SURGERY Right    "I BROKE MY SHOUDLER  . THIGH SURGERY     "TO REMOVE A TUMOR "   Past Medical History:  Diagnosis Date  . Acute on chronic respiratory failure with hypoxia ([place]) 01/22/2019  . Atrial fibrillation with RVR 05/19/2017  . Bipolar I disorder 01/23/2015  . CAD (coronary artery disease)    Nonobstructive; Managed by Dr. Bronson Ing  . Cardiomegaly 01/12/2018  . Chronic anticoagulation 05/30/2015  . Chronic atrial fibrillation ([place]) 01/04/2015  . Chronic back pain    Lower back  . Chronic diastolic CHF (congestive heart failure) 05/30/2015  . Diabetes mellitus, type 2, without complication   . Diabetic neuropathy 02/06/2016  . Dysrhythmia    A-Fib  . Essential hypertension   . Herpes genitalis in women 07/16/2015  . Hyperlipidemia   . Hypoxia 02/01/2019  . Insomnia 01/23/2015  . Lipoma 02/08/2015  . Major depressive disorder   . Migraine headache with aura 02/12/2016  . 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[place]) 04/21/2019  . NAFL (nonalcoholic fatty liver) 2/58/5277  . OSA (obstructive sleep apnea) 02/24/2019   10/09/2018 - HST  - AHI 40.6   . Pulmonary hypertension   . Renal insufficiency    Managed by Dr. Wallace Keller  . RLS (restless legs syndrome) 04/27/2015   BP 114/72   Pulse (!) 59   Temp (!) 97.5 F (36.4 C)   Ht 5\' 2"  (1.575 m)   Wt 199 lb (90.3 kg)   BMI 36.40 kg/m   Opioid Risk Score:   Fall Risk Score:  `1  Depression screen PHQ 2/9  Depression screen [facility] 2/9 09/05/2020 08/14/2020 06/13/2020 05/29/2020 04/19/2020 04/02/2020 03/26/2020  Decreased Interest 0 0 0 0 0 0 1  Down, Depressed, Hopeless 0 0 0 0 0 0 1  PHQ - 2 Score 0 0 0 0 0 0 2  Altered sleeping - - 3 - - - -  Tired, decreased energy - - 2 - - - -  Change in appetite - - 3 - - - -  Feeling bad or failure about yourself  - - 0 - - - -   Trouble concentrating - - 0 - - - -  Moving slowly or fidgety/restless - - 0 - - - -  Suicidal thoughts - - 0 - - - -  PHQ-9 Score - - 8 - - - -  Difficult doing work/chores - - - - - - -  Some recent data might be hidden   Review of Systems  Musculoskeletal: Positive for back pain and gait problem.  All other systems reviewed and are negative.      Objective:   Physical Exam  Awake, somewhat alert, husband here- provides some of answers to questions, NAD Sitting on table, slightly slumped BMI 36 MS: LEs- RLE- HF - painful! 4+/5, KE/KF 5-/5, and DF/PF 5-/5 LLE- 5/5 in same muscles.   (+) SLR on R; (-) on L  Neuro: Decreased sensation to light touch from L2 down to S1 B/L per pt       Assessment & Plan:   Patient is a 75 yr old female with hx of  CAD,pulmonary HTN-and DM- with CKD- Stage III here for right low back pain with sciaticaAs well as upper back/neck pain/myofascial pain.  Here for f/u on chronic low back pain.  1. Con't Dilaudid 2 mg 4x/day as needed  2. Con't Robaixn- doesn't need refill  3. If has episode again, please have hospital call ME- to discuss PO steroids,   4. F/U in 65months- call if need me eariler than that .

## 2020-09-07 NOTE — Patient Instructions (Signed)
  Patient is a 75 yr old female with hx of  CAD,pulmonary HTN-and DM- with CKD- Stage III here for right low back pain with sciaticaAs well as upper back/neck pain/myofascial pain.  Here for f/u on chronic low back pain.  1. Con't Dilaudid 2 mg 4x/day as needed  2. Con't Robaixn- doesn't need refill  3. If has episode again, please have hospital call ME- to discuss PO steroids,   4. F/U in 69months- call if need me eariler than that .

## 2020-09-11 ENCOUNTER — Telehealth: Payer: Self-pay | Admitting: Family Medicine

## 2020-09-11 ENCOUNTER — Encounter: Payer: Self-pay | Admitting: *Deleted

## 2020-09-11 DIAGNOSIS — E1165 Type 2 diabetes mellitus with hyperglycemia: Secondary | ICD-10-CM | POA: Diagnosis not present

## 2020-09-11 DIAGNOSIS — S92415A Nondisplaced fracture of proximal phalanx of left great toe, initial encounter for closed fracture: Secondary | ICD-10-CM | POA: Diagnosis not present

## 2020-09-11 DIAGNOSIS — I5032 Chronic diastolic (congestive) heart failure: Secondary | ICD-10-CM | POA: Diagnosis not present

## 2020-09-11 DIAGNOSIS — G894 Chronic pain syndrome: Secondary | ICD-10-CM | POA: Diagnosis not present

## 2020-09-11 DIAGNOSIS — M461 Sacroiliitis, not elsewhere classified: Secondary | ICD-10-CM | POA: Diagnosis not present

## 2020-09-11 DIAGNOSIS — E1140 Type 2 diabetes mellitus with diabetic neuropathy, unspecified: Secondary | ICD-10-CM | POA: Diagnosis not present

## 2020-09-11 DIAGNOSIS — M79672 Pain in left foot: Secondary | ICD-10-CM | POA: Diagnosis not present

## 2020-09-11 DIAGNOSIS — I110 Hypertensive heart disease with heart failure: Secondary | ICD-10-CM | POA: Diagnosis not present

## 2020-09-11 NOTE — Telephone Encounter (Signed)
Okay to continue therapy as needed?

## 2020-09-11 NOTE — Telephone Encounter (Signed)
Opened in error

## 2020-09-11 NOTE — Telephone Encounter (Signed)
yes

## 2020-09-11 NOTE — Telephone Encounter (Signed)
Pt had a fall on Monday April 11th

## 2020-09-11 NOTE — Telephone Encounter (Signed)
FYI:   Called [facility] back   LM with Joellen Jersey - is she ok, does she need anything from [place]?    Called pt - she did have a fall - had some toe pain = seen Dr Irving Shows today - one broken toe, he will address.   Some scrapes on her face - otherwise ok.

## 2020-09-12 DIAGNOSIS — M461 Sacroiliitis, not elsewhere classified: Secondary | ICD-10-CM | POA: Diagnosis not present

## 2020-09-12 DIAGNOSIS — I110 Hypertensive heart disease with heart failure: Secondary | ICD-10-CM | POA: Diagnosis not present

## 2020-09-12 DIAGNOSIS — E1165 Type 2 diabetes mellitus with hyperglycemia: Secondary | ICD-10-CM | POA: Diagnosis not present

## 2020-09-12 DIAGNOSIS — G894 Chronic pain syndrome: Secondary | ICD-10-CM | POA: Diagnosis not present

## 2020-09-12 DIAGNOSIS — I5032 Chronic diastolic (congestive) heart failure: Secondary | ICD-10-CM | POA: Diagnosis not present

## 2020-09-12 DIAGNOSIS — E1140 Type 2 diabetes mellitus with diabetic neuropathy, unspecified: Secondary | ICD-10-CM | POA: Diagnosis not present

## 2020-09-12 NOTE — Telephone Encounter (Signed)
Katie aware

## 2020-09-13 ENCOUNTER — Other Ambulatory Visit: Payer: Self-pay

## 2020-09-13 DIAGNOSIS — E1140 Type 2 diabetes mellitus with diabetic neuropathy, unspecified: Secondary | ICD-10-CM | POA: Diagnosis not present

## 2020-09-13 DIAGNOSIS — M6281 Muscle weakness (generalized): Secondary | ICD-10-CM | POA: Diagnosis not present

## 2020-09-13 DIAGNOSIS — M461 Sacroiliitis, not elsewhere classified: Secondary | ICD-10-CM | POA: Diagnosis not present

## 2020-09-13 DIAGNOSIS — I5032 Chronic diastolic (congestive) heart failure: Secondary | ICD-10-CM | POA: Diagnosis not present

## 2020-09-13 DIAGNOSIS — E1165 Type 2 diabetes mellitus with hyperglycemia: Secondary | ICD-10-CM | POA: Diagnosis not present

## 2020-09-13 DIAGNOSIS — Z7984 Long term (current) use of oral hypoglycemic drugs: Secondary | ICD-10-CM | POA: Diagnosis not present

## 2020-09-13 DIAGNOSIS — R262 Difficulty in walking, not elsewhere classified: Secondary | ICD-10-CM | POA: Diagnosis not present

## 2020-09-13 DIAGNOSIS — R296 Repeated falls: Secondary | ICD-10-CM | POA: Diagnosis not present

## 2020-09-13 DIAGNOSIS — F319 Bipolar disorder, unspecified: Secondary | ICD-10-CM | POA: Diagnosis not present

## 2020-09-13 DIAGNOSIS — I482 Chronic atrial fibrillation, unspecified: Secondary | ICD-10-CM | POA: Diagnosis not present

## 2020-09-13 DIAGNOSIS — I2510 Atherosclerotic heart disease of native coronary artery without angina pectoris: Secondary | ICD-10-CM | POA: Diagnosis not present

## 2020-09-13 DIAGNOSIS — Z7901 Long term (current) use of anticoagulants: Secondary | ICD-10-CM | POA: Diagnosis not present

## 2020-09-13 DIAGNOSIS — I110 Hypertensive heart disease with heart failure: Secondary | ICD-10-CM | POA: Diagnosis not present

## 2020-09-13 DIAGNOSIS — M797 Fibromyalgia: Secondary | ICD-10-CM | POA: Diagnosis not present

## 2020-09-13 DIAGNOSIS — J449 Chronic obstructive pulmonary disease, unspecified: Secondary | ICD-10-CM | POA: Diagnosis not present

## 2020-09-13 DIAGNOSIS — G894 Chronic pain syndrome: Secondary | ICD-10-CM | POA: Diagnosis not present

## 2020-09-13 MED ORDER — DILTIAZEM HCL ER COATED BEADS 120 MG PO CP24: 120.0000 mg | ORAL_CAPSULE | Freq: Every day | ORAL | 2 refills | Status: DC

## 2020-09-13 NOTE — Telephone Encounter (Signed)
refilled diltiazem per [facility] to [facility]

## 2020-09-14 ENCOUNTER — Other Ambulatory Visit: Payer: Self-pay | Admitting: Adult Health

## 2020-09-17 ENCOUNTER — Ambulatory Visit (HOSPITAL_COMMUNITY)
Admission: RE | Admit: 2020-09-17 | Discharge: 2020-09-17 | Disposition: A | Discharge status: Home / Self Care | Payer: Medicare Other | Source: Ambulatory Visit | Attending: Nurse Practitioner | Admitting: Nurse Practitioner

## 2020-09-17 ENCOUNTER — Encounter: Payer: Self-pay | Admitting: Nurse Practitioner

## 2020-09-17 ENCOUNTER — Other Ambulatory Visit: Payer: Self-pay

## 2020-09-17 ENCOUNTER — Other Ambulatory Visit (HOSPITAL_COMMUNITY): Payer: Self-pay | Admitting: Psychiatry

## 2020-09-17 ENCOUNTER — Ambulatory Visit (INDEPENDENT_AMBULATORY_CARE_PROVIDER_SITE_OTHER): Payer: Medicare Other

## 2020-09-17 ENCOUNTER — Ambulatory Visit (INDEPENDENT_AMBULATORY_CARE_PROVIDER_SITE_OTHER): Payer: Medicare Other | Admitting: Nurse Practitioner

## 2020-09-17 VITALS — BP 134/68 | HR 75 | Temp 97.5°F | Ht 62.0 in | Wt 201.0 lb

## 2020-09-17 DIAGNOSIS — R0789 Other chest pain: Secondary | ICD-10-CM | POA: Diagnosis not present

## 2020-09-17 DIAGNOSIS — W19XXXA Unspecified fall, initial encounter: Secondary | ICD-10-CM | POA: Diagnosis not present

## 2020-09-17 DIAGNOSIS — S0993XA Unspecified injury of face, initial encounter: Secondary | ICD-10-CM | POA: Diagnosis not present

## 2020-09-17 DIAGNOSIS — S0990XA Unspecified injury of head, initial encounter: Secondary | ICD-10-CM | POA: Insufficient documentation

## 2020-09-17 DIAGNOSIS — Y92009 Unspecified place in unspecified non-institutional (private) residence as the place of occurrence of the external cause: Secondary | ICD-10-CM

## 2020-09-17 DIAGNOSIS — I517 Cardiomegaly: Secondary | ICD-10-CM | POA: Diagnosis not present

## 2020-09-17 DIAGNOSIS — R296 Repeated falls: Secondary | ICD-10-CM | POA: Diagnosis not present

## 2020-09-17 HISTORY — DX: Unspecified injury of head, initial encounter: S09.90XA

## 2020-09-17 NOTE — Progress Notes (Signed)
Acute Office Visit  Subjective:    Patient ID: Amber Stephenson, female    DOB: 11-Feb-1946, 75 y.o.   MRN: [id number]  Chief Complaint  Patient presents with  . Fall    Fall The accident occurred 3 to 5 days ago. The fall occurred in unknown circumstances. She fell from a height of 1 to 2 ft. She landed on hard floor. There was no blood loss. The point of impact was the head (chest). The pain is present in the head (chest). The pain is at a severity of 8/10. The pain is severe. The symptoms are aggravated by ambulation and movement. Associated symptoms include headaches. Pertinent negatives include no nausea, numbness, visual change or vomiting. Treatments tried: Dilaudid/Robaxin. The treatment provided moderate relief.     Past Medical History:  Diagnosis Date  . Acute on chronic respiratory failure with hypoxia ([place]) 01/22/2019  . Atrial fibrillation with RVR 05/19/2017  . Bipolar I disorder 01/23/2015  . CAD (coronary artery disease)    Nonobstructive; Managed by Dr. Bronson Ing  . Cardiomegaly 01/12/2018  . Chronic anticoagulation 05/30/2015  . Chronic atrial fibrillation ([place]) 01/04/2015  . Chronic back pain    Lower back  . Chronic diastolic CHF (congestive heart failure) 05/30/2015  . Diabetes mellitus, type 2, without complication   . Diabetic neuropathy 02/06/2016  . Dysrhythmia    A-Fib  . Essential hypertension   . Herpes genitalis in women 07/16/2015  . Hyperlipidemia   . Hypoxia 02/01/2019  . Insomnia 01/23/2015  . Lipoma 02/08/2015  . Major depressive disorder   . Migraine headache with aura 02/12/2016  . 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[place]) 04/21/2019  . NAFL (nonalcoholic fatty liver) 09/01/270  . OSA (obstructive sleep apnea) 02/24/2019   10/09/2018 - HST  - AHI 40.6   . Pulmonary hypertension   . Renal insufficiency    Managed by Dr. Wallace Keller  . RLS (restless legs syndrome) 04/27/2015    Past Surgical History:  Procedure Laterality Date  . BREAST  REDUCTION SURGERY    . EYE SURGERY Right    cateracts  . HAMMER TOE SURGERY    . LEFT HEART CATH AND CORONARY ANGIOGRAPHY N/A 02/03/2018   Procedure: LEFT HEART CATH AND CORONARY ANGIOGRAPHY;  Surgeon: Martinique, Peter M, MD;  Location: [facility];  Service: Cardiovascular;  Laterality: N/A;  . REVERSE SHOULDER ARTHROPLASTY Right 08/19/2019   Procedure: REVERSE SHOULDER ARTHROPLASTY;  Surgeon: Netta Cedars, MD;  Location: [facility];  Service: Orthopedics;  Laterality: Right;  interscalene block  . SHOULDER SURGERY Right    "I BROKE MY SHOUDLER  . THIGH SURGERY     "TO REMOVE A TUMOR "    Family History  Problem Relation Age of Onset  . Diabetes Mother   . Heart disease Mother   . Heart disease Brother   . Heart disease Sister        CABG  . Diabetes Sister   . Alzheimer's disease Father   . Alcohol abuse Sister   . Mental illness Brother   . Diabetes Brother     Social History   Socioeconomic History  . Marital status: Married    Spouse name: Orpah Greek  . Number of children: 3  . Years of education: 48  . Highest education level: Bachelor's degree (e.g., BA, AB, BS)  Occupational History  . Occupation: Retired    Comment: marketing  Tobacco Use  . Smoking status: Never Smoker  . Smokeless tobacco: Never Used  Vaping  Use  . Vaping Use: Never used  Substance and Sexual Activity  . Alcohol use: Not Currently    Alcohol/week: 0.0 standard drinks  . Drug use: No  . Sexual activity: Yes    Partners: Male    Birth control/protection: Post-menopausal  Other Topics Concern  . Not on file  Social History Narrative   Lives at home with husband.       Right handed   Social Determinants of Health   Financial Resource Strain: Not on file  Food Insecurity: Not on file  Transportation Needs: Not on file  Physical Activity: Not on file  Stress: Not on file  Social Connections: Not on file  Intimate Partner Violence: Not on file    Outpatient Medications Prior to Visit   Medication Sig Dispense Refill  . albuterol (PROVENTIL) (2.5 MG/3ML) 0.083% nebulizer solution Take 3 mLs (2.5 mg total) by nebulization every 6 (six) hours as needed for wheezing or shortness of breath. 150 mL 0  . albuterol (VENTOLIN HFA) 108 (90 Base) MCG/ACT inhaler Inhale 2 puffs into the lungs every 6 (six) hours as needed for wheezing or shortness of breath. 1 each 0  . apixaban (ELIQUIS) 5 MG TABS tablet Take 1 tablet (5 mg total) by mouth 2 (two) times daily. 60 tablet 0  . ARIPiprazole (ABILIFY) 10 MG tablet Take 3 tablets (30 mg total) by mouth daily. 90 tablet 0  . atorvastatin (LIPITOR) 40 MG tablet Take 1 tablet (40 mg total) by mouth daily. 30 tablet 0  . Balsam [place]-Castor Oil (VENELEX) OINT Apply topically. Special Instructions: Apply to coccyx and bilateral buttocks q shift for prevention/ redness.    . carbamazepine (CARBATROL) 100 MG 12 hr capsule Take by mouth at bedtime.    Marland Kitchen denosumab (PROLIA) 60 MG/ML SOSY injection Inject 60 mg into the skin every 6 (six) months. 1 mL 1  . diltiazem (CARDIZEM CD) 120 MG 24 hr capsule Take 1 capsule (120 mg total) by mouth at bedtime. 90 capsule 2  . escitalopram (LEXAPRO) 10 MG tablet Take 1 tablet (10 mg total) by mouth daily. 30 tablet 0  . Fluticasone-Salmeterol (ADVAIR DISKUS) 500-50 MCG/DOSE AEPB Inhale 1 puff into the lungs in the morning and at bedtime. 60 each 0  . furosemide (LASIX) 20 MG tablet Take 0.5 tablets (10 mg total) by mouth daily. TAKE 1 TABLET EVERY MORNING    . Galcanezumab-gnlm (EMGALITY) 120 MG/ML SOAJ Administer once a month starting 09/27/2020 1.12 mL 11  . HYDROmorphone (DILAUDID) 2 MG tablet Take 1 tablet (2 mg total) by mouth every 6 (six) hours as needed for severe pain. 10 tablet 0  . HYDROmorphone (DILAUDID) 2 MG tablet Take 1 tablet (2 mg total) by mouth every 6 (six) hours as needed for severe pain. 120 tablet 0  . icosapent Ethyl (VASCEPA) 1 g capsule TAKE 2 CAPSULES TWICE A DAY WITH MEALS 120 capsule 0  .  insulin glargine (LANTUS SOLOSTAR) 100 UNIT/ML Solostar Pen Inject 50 Units into the skin 2 (two) times daily. 30 mL 0  . insulin lispro (HUMALOG KWIKPEN) 100 UNIT/ML KwikPen With each meal check glucose. If glucose is <100. Use 0 units. 101-200 use 5 units. Over 200 use 10 units 15 mL 3  . LORazepam (ATIVAN) 1 MG tablet Take 1 tablet (1 mg total) by mouth 2 (two) times daily. And give 2 tablets by mouth at bedtime 90 tablet 0  . meclizine (ANTIVERT) 12.5 MG tablet Take 1 tablet (12.5 mg total)  by mouth 3 (three) times daily as needed for dizziness. 30 tablet 0  . metFORMIN (GLUCOPHAGE) 500 MG tablet Take 1 tablet (500 mg total) by mouth 2 (two) times daily after a meal. 180 tablet 0  . methocarbamol (ROBAXIN) 500 MG tablet Take 1 tablet (500 mg total) by mouth 3 (three) times daily as needed. 90 tablet 0  . metoprolol succinate (TOPROL XL) 25 MG 24 hr tablet Take 1 tablet (25 mg total) by mouth in the morning and at bedtime. 180 tablet 3  . metoprolol succinate (TOPROL-XL) 100 MG 24 hr tablet Take 1 tablet (100 mg total) by mouth in the morning and at bedtime. 60 tablet 0  . NON FORMULARY Diet: _____ Regular, ___  __ NAS, ___x____Consistent Carbohydrate, _______NPO _____Other    . polyethylene glycol (MIRALAX / GLYCOLAX) 17 g packet Take 17 g by mouth daily. 14 each 0  . potassium chloride SA (KLOR-CON) 20 MEQ tablet Take 1 tablet (20 mEq total) by mouth daily. 30 tablet 0  . pregabalin (LYRICA) 200 MG capsule Take 1 capsule (200 mg total) by mouth 2 (two) times daily. 60 capsule 0  . RYBELSUS 7 MG TABS Take 7 mg by mouth daily at 12 noon. 30 tablet 0  . Vitamin D, Cholecalciferol, 25 MCG (1000 UT) TABS Take 1 tablet by mouth daily in the afternoon.     Facility-Administered Medications Prior to Visit  Medication Dose Route Frequency Provider Last Rate Last Admin  . bupivacaine-epinephrine (MARCAINE W/ EPI) 0.5% -1:200000 injection    Anesthesia Intra-op Effie Berkshire, MD   12 mL at 01/21/19  0935    Allergies  Allergen Reactions  . Ivp Dye [Iodinated Diagnostic Agents] Anaphylaxis and Swelling    Throat closes   . Codeine Other (See Comments)  . Iodine Other (See Comments)  . Latex     Latex tape pulls skin with it    Review of Systems  Constitutional: Negative.   HENT: Negative.   Respiratory: Negative.   Cardiovascular: Negative.   Gastrointestinal: Negative for nausea and vomiting.  Genitourinary: Negative.   Neurological: Positive for headaches. Negative for numbness.  All other systems reviewed and are negative.      Objective:    Physical Exam Vitals reviewed.  Constitutional:      Appearance: Normal appearance.  HENT:     Head: Normocephalic.     Nose: Nose normal.     Mouth/Throat:     Mouth: Mucous membranes are moist.  Eyes:     Conjunctiva/sclera: Conjunctivae normal.  Cardiovascular:     Rate and Rhythm: Normal rate and regular rhythm.     Pulses: Normal pulses.     Heart sounds: Normal heart sounds.  Pulmonary:     Effort: Pulmonary effort is normal.     Breath sounds: Normal breath sounds.  Abdominal:     General: Bowel sounds are normal.  Musculoskeletal:        General: Tenderness present.  Skin:    Findings: Bruising and erythema present.  Neurological:     Mental Status: She is alert and oriented to person, place, and time.  Psychiatric:        Behavior: Behavior normal.     BP 134/68   Pulse 75   Temp (!) 97.5 F (36.4 C) (Temporal)   Ht 5\' 2"  (1.575 m)   Wt 201 lb (91.2 kg)   SpO2 93%   BMI 36.76 kg/m  Wt Readings from Last 3 Encounters:  09/17/20  201 lb (91.2 kg)  09/07/20 199 lb (90.3 kg)  09/05/20 195 lb 12.8 oz (88.8 kg)    Health Maintenance Due  Topic Date Due  . DEXA SCAN  10/13/2019  . OPHTHALMOLOGY EXAM  11/10/2019  . FOOT EXAM  05/19/2020  . URINE MICROALBUMIN  08/28/2020    There are no preventive care reminders to display for this patient.   Lab Results  Component Value Date   TSH 2.340  08/29/2019   Lab Results  Component Value Date   WBC 13.2 (H) 07/25/2020   HGB 14.4 07/25/2020   HCT 43.3 07/25/2020   MCV 90.2 07/25/2020   PLT 173 07/25/2020   Lab Results  Component Value Date   NA 140 08/09/2020   K 3.6 08/09/2020   CO2 27 08/09/2020   GLUCOSE 134 (H) 08/09/2020   BUN 22 08/09/2020   CREATININE 0.70 08/09/2020   BILITOT 0.9 07/25/2020   ALKPHOS 93 07/25/2020   AST 15 07/25/2020   ALT 15 07/25/2020   PROT 6.8 07/25/2020   ALBUMIN 3.5 07/25/2020   CALCIUM 8.9 08/09/2020   ANIONGAP 10 08/09/2020   GFR 59.25 (L) 08/07/2015   Lab Results  Component Value Date   CHOL 128 08/29/2019   Lab Results  Component Value Date   HDL 39 (L) 08/29/2019   Lab Results  Component Value Date   LDLCALC 62 08/29/2019   Lab Results  Component Value Date   TRIG 160 (H) 08/29/2019   Lab Results  Component Value Date   CHOLHDL 3.3 08/29/2019   Lab Results  Component Value Date   HGBA1C 9.1 (H) 08/09/2020       Assessment & Plan:   Problem List Items Addressed This Visit      Other   Fall at home, initial encounter - Primary    Patient fell 3 to 4 days ago, this is the second fall in the last few months.  Patient hit her head on bathroom floor during recent fall.  Patient is reporting worsening pain, tenderness right back scalp.  Swollen and tender to touch.  Patient is not reporting any dizziness, headache.  Oriented x3.  Patient is on a blood thinner.  Ordered CT without contrast of the head.  To rule out hematoma. X-ray of the chest to rule out any broken rib due to patient's report of increased pain.  Patient currently is using Dilaudid and Robaxin for pain management.  Patient will need to schedule with PCP to go over medications that may be at risk for constant falls.      Relevant Orders   DG Chest 2 View   Head trauma    Head trauma from fall.  CT stat without contrast ordered.  Results pending.      Relevant Orders   CT Head Wo Contrast        No orders of the defined types were placed in this encounter.    Ivy Lynn, NP

## 2020-09-17 NOTE — Assessment & Plan Note (Signed)
Patient fell 3 to 4 days ago, this is the second fall in the last few months.  Patient hit her head on bathroom floor during recent fall.  Patient is reporting worsening pain, tenderness right back scalp.  Swollen and tender to touch.  Patient is not reporting any dizziness, headache.  Oriented x3.  Patient is on a blood thinner.  Ordered CT without contrast of the head.  To rule out hematoma. X-ray of the chest to rule out any broken rib due to patient's report of increased pain.  Patient currently is using Dilaudid and Robaxin for pain management.  Patient will need to schedule with PCP to go over medications that may be at risk for constant falls.

## 2020-09-17 NOTE — Assessment & Plan Note (Signed)
Head trauma from fall.  CT stat without contrast ordered.  Results pending.

## 2020-09-17 NOTE — Patient Instructions (Signed)
Head Injury, Adult There are many types of head injuries. They can be as minor as a small bump. Some head injuries can be worse. Worse injuries include:  A strong hit to the head that shakes the brain back and forth, causing damage (concussion).  A bruise (contusion) of the brain. This means there is bleeding in the brain that can cause swelling.  A cracked skull (skull fracture).  Bleeding in the brain that gathers, gets thick (makes a clot), and forms a bump (hematoma). Most problems from a head injury come in the first 24 hours. However, you may still have side effects up to 7-10 days after your injury. It is important to watch your condition for any changes. You may need to be watched in the emergency department or urgent care, or you may need to stay in the hospital. What are the causes? There are many possible causes of a head injury. A serious head injury may be caused by:  A car accident.  Bicycle or motorcycle accidents.  Sports injuries.  Falls.  Being hit by an object. What are the signs or symptoms? Symptoms of a head injury include a bruise, bump, or bleeding where the injury happened. Other physical symptoms may include:  Headache.  Feeling like you may vomit (nauseous) or vomiting.  Dizziness.  Blurred or double vision.  Being uncomfortable around bright lights or loud noises.  Shaking movements that you cannot control (seizures).  Feeling tired.  Trouble being woken up.  Fainting or loss of consciousness. Mental or emotional symptoms may include:  Feeling grumpy or cranky.  Confusion and memory problems.  Having trouble paying attention or concentrating.  Changes in eating or sleeping habits.  Feeling worried or nervous (anxious).  Feeling sad (depressed). How is this treated? Treatment for this condition depends on how severe the injury is and the type of injury you have. The main goal is to prevent problems and to allow the brain time to  heal. Mild head injury If you have a mild head injury, you may be sent home, and treatment may include:  Being watched. A responsible adult should stay with you for 24 hours after your injury and check on you often.  Physical rest.  Brain rest.  Pain medicines. Severe head injury If you have a severe head injury, treatment may include:  Being watched closely. This includes staying in the hospital.  Medicines to: ? Help with pain. ? Prevent seizures. ? Help with brain swelling.  Protecting your airway and using a machine that helps you breathe (ventilator).  Treatments to watch for and manage swelling inside the brain.  Brain surgery. This may be needed to: ? Remove a collection of blood or blood clots. ? Stop the bleeding. ? Remove a part of the skull. This allows room for the brain to swell. Follow these instructions at home: Activity  Rest.  Avoid activities that are hard or tiring.  Make sure you get enough sleep.  Let your brain rest. Do this by limiting activities that need a lot of thought or attention, such as: ? Watching TV. ? Playing memory games and puzzles. ? Job-related work or homework. ? Working on [employer], and texting.  Avoid activities that could cause another head injury until your doctor says it is okay. This includes playing sports. Having another head injury, especially before the first one has healed, can be dangerous.  Ask your doctor when it is safe for you to go back to  your normal activities, such as work or school. Ask your doctor for a step-by-step plan for slowly going back to your normal activities.  Ask your doctor when you can drive, ride a bicycle, or use heavy machinery. Do not do these activities if you are dizzy. Lifestyle  Do not drink alcohol until your doctor says it is okay.  Do not use drugs.  If it is harder than usual to remember things, write them down.  If you are easily distracted, try to do one  thing at a time.  Talk with family members or close friends when making important decisions.  Tell your friends, family, a trusted co-worker, and work [employer] about your injury, symptoms, and limits (restrictions). Have them watch for any problems that are new or getting worse.   General instructions  Take over-the-counter and prescription medicines only as told by your doctor.  Have someone stay with you for 24 hours after your head injury. This person should watch you for any changes in your symptoms and be ready to get help.  Keep all follow-up visits as told by your doctor. This is important. How is this prevented?  Work on [employer]. This can help you avoid falls.  Wear a seat belt when you are in a moving vehicle.  Wear a helmet when you: ? Ride a bicycle. ? Ski. ? Do any other sport or activity that has a risk of injury.  If you drink alcohol: ? Limit how much you use to:  0-1 drink a day for nonpregnant women.  0-2 drinks a day for men. ? Be aware of how much alcohol is in your drink. In the [place] one drink equals one 12 oz bottle of beer (355 mL), one 5 oz glass of wine (148 mL), or one 1 oz glass of hard liquor (44 mL).  Make your home safer by: ? Getting rid of clutter from the floors and stairs. This includes things that can make you trip. ? Using grab bars in bathrooms and handrails by stairs. ? Placing non-slip mats on floors and in bathtubs. ? Putting more light in dim areas. Where to find more information  Centers for Disease Control and Prevention: http://www.wolf.info/ Get help right away if:  You have: ? A very bad headache that is not helped by medicine. ? Trouble walking or weakness in your arms and legs. ? Clear or bloody fluid coming from your nose or ears. ? Changes in how you see (vision). ? A seizure. ? More confusion or more grumpy moods.  Your symptoms get worse.  You are sleepier than normal and have trouble staying awake.  You  lose your balance.  The black centers of your eyes (pupils) change in size.  Your speech is slurred.  Your dizziness gets worse.  You vomit. These symptoms may be an emergency. Do not wait to see if the symptoms will go away. Get medical help right away. Call your local emergency services (911 in the [place] Do not drive yourself to the hospital. Summary  Head injuries can be as minor as a small bump. Some head injuries can be worse.  Treatment for this condition depends on how severe the injury is and the type of injury you have.  Have someone stay with you for 24 hours after your head injury.  Ask your doctor when it is safe for you to go back to your normal activities, such as work or school.  To prevent a head injury,  wear a seat belt in a car, wear a helmet when you use a bicycle, limit your alcohol use, and make your home safer. This information is not intended to replace advice given to you by your health care provider. Make sure you discuss any questions you have with your health care provider. Document Revised: 04/01/2019 Document Reviewed: 04/01/2019 Elsevier Patient Education  2021 [employer].

## 2020-09-20 DIAGNOSIS — E1140 Type 2 diabetes mellitus with diabetic neuropathy, unspecified: Secondary | ICD-10-CM | POA: Diagnosis not present

## 2020-09-20 DIAGNOSIS — M461 Sacroiliitis, not elsewhere classified: Secondary | ICD-10-CM | POA: Diagnosis not present

## 2020-09-20 DIAGNOSIS — I5032 Chronic diastolic (congestive) heart failure: Secondary | ICD-10-CM | POA: Diagnosis not present

## 2020-09-20 DIAGNOSIS — G894 Chronic pain syndrome: Secondary | ICD-10-CM | POA: Diagnosis not present

## 2020-09-20 DIAGNOSIS — E1165 Type 2 diabetes mellitus with hyperglycemia: Secondary | ICD-10-CM | POA: Diagnosis not present

## 2020-09-20 DIAGNOSIS — I110 Hypertensive heart disease with heart failure: Secondary | ICD-10-CM | POA: Diagnosis not present

## 2020-09-21 ENCOUNTER — Telehealth: Payer: Self-pay | Admitting: *Deleted

## 2020-09-21 NOTE — Telephone Encounter (Signed)
Mark PT with  [facility] ([facility]) called to report that Mrs Dirks was complaining of lower back pain and toe pain. I let him know Dr Dagoberto Ligas out of office until Monday but will pass info along. I see this has been reported and handled by primary due to a fall.

## 2020-09-24 ENCOUNTER — Ambulatory Visit (INDEPENDENT_AMBULATORY_CARE_PROVIDER_SITE_OTHER): Payer: Medicare Other | Admitting: Internal Medicine

## 2020-09-24 ENCOUNTER — Other Ambulatory Visit: Payer: Self-pay

## 2020-09-24 ENCOUNTER — Encounter: Payer: Self-pay | Admitting: Internal Medicine

## 2020-09-24 VITALS — BP 140/70 | HR 83 | Ht 62.0 in | Wt 189.2 lb

## 2020-09-24 DIAGNOSIS — E119 Type 2 diabetes mellitus without complications: Secondary | ICD-10-CM

## 2020-09-24 DIAGNOSIS — E785 Hyperlipidemia, unspecified: Secondary | ICD-10-CM | POA: Diagnosis not present

## 2020-09-24 DIAGNOSIS — E1165 Type 2 diabetes mellitus with hyperglycemia: Secondary | ICD-10-CM | POA: Diagnosis not present

## 2020-09-24 DIAGNOSIS — Z794 Long term (current) use of insulin: Secondary | ICD-10-CM | POA: Diagnosis not present

## 2020-09-24 DIAGNOSIS — E0865 Diabetes mellitus due to underlying condition with hyperglycemia: Secondary | ICD-10-CM | POA: Insufficient documentation

## 2020-09-24 HISTORY — DX: Hyperlipidemia, unspecified: E78.5

## 2020-09-24 HISTORY — DX: Type 2 diabetes mellitus without complications: E11.9

## 2020-09-24 LAB — POCT GLUCOSE (DEVICE FOR HOME USE): POC Glucose: 146 mg/dl — AB (ref 70–99)

## 2020-09-24 MED ORDER — DEXCOM G6 SENSOR MISC: 1.0000 | 3 refills | Status: DC

## 2020-09-24 MED ORDER — METFORMIN HCL 500 MG PO TABS: 1000.0000 mg | ORAL_TABLET | Freq: Two times a day (BID) | ORAL | 3 refills | Status: DC

## 2020-09-24 MED ORDER — DEXCOM G6 TRANSMITTER MISC: 1.0000 | 3 refills | Status: DC

## 2020-09-24 MED ORDER — RYBELSUS 14 MG PO TABS: 14.0000 mg | ORAL_TABLET | Freq: Every day | ORAL | 1 refills | Status: DC

## 2020-09-24 MED ORDER — LANTUS SOLOSTAR 100 UNIT/ML ~~LOC~~ SOPN: 45.0000 [IU] | PEN_INJECTOR | Freq: Two times a day (BID) | SUBCUTANEOUS | 2 refills | Status: DC

## 2020-09-24 NOTE — Telephone Encounter (Signed)
No it was my understanding that the PT was calling just to report her complaints but that she had been to her primary because of her fall with the same complaints.

## 2020-09-24 NOTE — Patient Instructions (Signed)
-   Increase Metformin 500 mg, TWO tablets in the Morning  And 1 tablet in the evening for one week, then increase to TWO tablets in the morning and TWO tablets in the evening  - Increase Rybelsus ot 14 mg, 1 tablet in the morning  - Decrease Lantus to 45 units Twice a day  -Humalog correctional insulin:Use the scale below to help guide you before each meal   Blood sugar before meal Number of units to inject  Less than 140 0 unit  141 -  160 1 units  161 -  180 2 units  181 -  200 3 units  201 -  220 4 units  221 -  240 5 units  241 -  260 6 units  261 -  280 7 units  281 -  300 8 units  301 - 320 9 units      HOW TO TREAT LOW BLOOD SUGARS (Blood sugar LESS THAN 70 MG/DL)  Please follow the RULE OF 15 for the treatment of hypoglycemia treatment (when your (blood sugars are less than 70 mg/dL)    STEP 1: Take 15 grams of carbohydrates when your blood sugar is low, which includes:   3-4 GLUCOSE TABS  OR  3-4 OZ OF JUICE OR REGULAR SODA OR  ONE TUBE OF GLUCOSE GEL     STEP 2: RECHECK blood sugar in 15 MINUTES STEP 3: If your blood sugar is still low at the 15 minute recheck --> then, go back to STEP 1 and treat AGAIN with another 15 grams of carbohydrates.

## 2020-09-24 NOTE — Progress Notes (Signed)
Name: Amber Stephenson  MRN/ DOB: [id number], 06/13/1945   Age/ Sex: 75 y.o., female    PCP: [name], MD   Reason for Endocrinology Evaluation: Type 2 Diabetes Mellitus     Date of Initial Endocrinology Visit: 09/24/2020     PATIENT IDENTIFIER: Amber Stephenson is a 75 y.o. female with a past medical history of T2DM, HTN, Dyslipidemia, OSA  and A.Fib . The patient presented for initial endocrinology clinic visit on 09/24/2020 for consultative assistance with her diabetes management.    HPI: Ms. Palmatier is accompanied by spouse    Diagnosed with DM in 2007 Prior Medications tried/Intolerance: she has been on basal insulin for years, prandial insulin started 2016. GLP-1 agonists started in 2017. Metformin restarted 2017 Currently checking blood sugars 1  x / day Hypoglycemia episodes : yes           Symptoms: no              Frequency: 1/ fasting  Hemoglobin A1c has ranged from 6.7% in 08/2019, peaking at 10.9% in 04/2020. Patient required assistance for hypoglycemia: no  Patient has required hospitalization within the last 1 year from hyper or hypoglycemia: no  In terms of diet, the patient eats  meals a day, does not snack.   Saw Dr. Dorris Fetch in 2016 and Dr. Dwyane Dee in 2017  Denies nausea or diarrhea  NO recent UTI 's per genital infection  Has chronic back and shoulder pains    HOME DIABETES REGIMEN: Metformin 500 mg 1 tablet bID  Lantus 50 units BID  Humalog  Per SS -holds it if BG below 100  Rybelsus 7 mg daily     Statin: yes ACE-I/ARB: no Prior Diabetic Education: no    METER DOWNLOAD SUMMARY: Did not bring    DIABETIC COMPLICATIONS: Microvascular complications:    Denies: CKD,  retinopathy , neuropathy ( she has this in the charts but denies neuropathy)  Last eye exam: Completed 07/2020  Macrovascular complications:    Denies: CAD, PVD, CVA   PAST HISTORY: Past Medical History:  Past Medical History:  Diagnosis Date  . Acute on chronic  respiratory failure with hypoxia ([place]) 01/22/2019  . Atrial fibrillation with RVR 05/19/2017  . Bipolar I disorder 01/23/2015  . CAD (coronary artery disease)    Nonobstructive; Managed by Dr. Bronson Ing  . Cardiomegaly 01/12/2018  . Chronic anticoagulation 05/30/2015  . Chronic atrial fibrillation ([place]) 01/04/2015  . Chronic back pain    Lower back  . Chronic diastolic CHF (congestive heart failure) 05/30/2015  . Diabetes mellitus, type 2, without complication   . Diabetic neuropathy 02/06/2016  . Dysrhythmia    A-Fib  . Essential hypertension   . Herpes genitalis in women 07/16/2015  . Hyperlipidemia   . Hypoxia 02/01/2019  . Insomnia 01/23/2015  . Lipoma 02/08/2015  . Major depressive disorder   . Migraine headache with aura 02/12/2016  . 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[place]) 04/21/2019  . NAFL (nonalcoholic fatty liver) 10/28/4130  . OSA (obstructive sleep apnea) 02/24/2019   10/09/2018 - HST  - AHI 40.6   . Pulmonary hypertension   . Renal insufficiency    Managed by Dr. Wallace Keller  . RLS (restless legs syndrome) 04/27/2015   Past Surgical History:  Past Surgical History:  Procedure Laterality Date  . BREAST REDUCTION SURGERY    . EYE SURGERY Right    cateracts  . HAMMER TOE SURGERY    . LEFT HEART CATH AND CORONARY ANGIOGRAPHY N/A 02/03/2018  Procedure: LEFT HEART CATH AND CORONARY ANGIOGRAPHY;  Surgeon: Martinique, Peter M, MD;  Location: [facility];  Service: Cardiovascular;  Laterality: N/A;  . REVERSE SHOULDER ARTHROPLASTY Right 08/19/2019   Procedure: REVERSE SHOULDER ARTHROPLASTY;  Surgeon: Netta Cedars, MD;  Location: [facility];  Service: Orthopedics;  Laterality: Right;  interscalene block  . SHOULDER SURGERY Right    "I BROKE MY SHOUDLER  . THIGH SURGERY     "TO REMOVE A TUMOR "      Social History:  reports that she has never smoked. She has never used smokeless tobacco. She reports previous alcohol use. She reports that she does not use drugs. Family History:   Family History  Problem Relation Age of Onset  . Diabetes Mother   . Heart disease Mother   . Heart disease Brother   . Heart disease Sister        CABG  . Diabetes Sister   . Alzheimer's disease Father   . Alcohol abuse Sister   . Mental illness Brother   . Diabetes Brother      HOME MEDICATIONS: Allergies as of 09/24/2020      Reactions   Ivp Dye [iodinated Diagnostic Agents] Anaphylaxis, Swelling   Throat closes   Codeine Other (See Comments)   Iodine Other (See Comments)   Latex    Latex tape pulls skin with it      Medication List       Accurate as of September 24, 2020 10:27 AM. If you have any questions, ask your nurse or doctor.        albuterol (2.5 MG/3ML) 0.083% nebulizer solution Commonly known as: PROVENTIL Take 3 mLs (2.5 mg total) by nebulization every 6 (six) hours as needed for wheezing or shortness of breath.   albuterol 108 (90 Base) MCG/ACT inhaler Commonly known as: VENTOLIN HFA Inhale 2 puffs into the lungs every 6 (six) hours as needed for wheezing or shortness of breath.   apixaban 5 MG Tabs tablet Commonly known as: Eliquis Take 1 tablet (5 mg total) by mouth 2 (two) times daily.   ARIPiprazole 10 MG tablet Commonly known as: ABILIFY Take 3 tablets (30 mg total) by mouth daily.   atorvastatin 40 MG tablet Commonly known as: LIPITOR Take 1 tablet (40 mg total) by mouth daily.   carbamazepine 100 MG 12 hr capsule Commonly known as: CARBATROL Take by mouth at bedtime.   Dexcom G6 Sensor Misc 1 Device by Does not apply route as directed. Started by: Dorita Sciara, MD   Dexcom G6 Transmitter Misc 1 Device by Does not apply route as directed. Started by: Dorita Sciara, MD   diltiazem 120 MG 24 hr capsule Commonly known as: CARDIZEM CD Take 1 capsule (120 mg total) by mouth at bedtime.   Emgality 120 MG/ML Soaj Generic drug: Galcanezumab-gnlm Administer once a month starting 09/27/2020   escitalopram 10 MG  tablet Commonly known as: Lexapro Take 1 tablet (10 mg total) by mouth daily.   Fluticasone-Salmeterol 500-50 MCG/DOSE Aepb Commonly known as: Advair Diskus Inhale 1 puff into the lungs in the morning and at bedtime.   furosemide 20 MG tablet Commonly known as: LASIX Take 0.5 tablets (10 mg total) by mouth daily. TAKE 1 TABLET EVERY MORNING   HYDROmorphone 2 MG tablet Commonly known as: Dilaudid Take 1 tablet (2 mg total) by mouth every 6 (six) hours as needed for severe pain.   HYDROmorphone 2 MG tablet Commonly known as: Dilaudid Take 1  tablet (2 mg total) by mouth every 6 (six) hours as needed for severe pain.   icosapent Ethyl 1 g capsule Commonly known as: Vascepa TAKE 2 CAPSULES TWICE A DAY WITH MEALS   insulin lispro 100 UNIT/ML KwikPen Commonly known as: HumaLOG KwikPen With each meal check glucose. If glucose is <100. Use 0 units. 101-200 use 5 units. Over 200 use 10 units   Lantus SoloStar 100 UNIT/ML Solostar Pen Generic drug: insulin glargine Inject 45 Units into the skin 2 (two) times daily. What changed: how much to take Changed by: Dorita Sciara, MD   LORazepam 1 MG tablet Commonly known as: ATIVAN Take 1 tablet (1 mg total) by mouth 2 (two) times daily. And give 2 tablets by mouth at bedtime   meclizine 12.5 MG tablet Commonly known as: ANTIVERT Take 1 tablet (12.5 mg total) by mouth 3 (three) times daily as needed for dizziness.   metFORMIN 500 MG tablet Commonly known as: GLUCOPHAGE Take 2 tablets (1,000 mg total) by mouth 2 (two) times daily with a meal. What changed:   how much to take  when to take this Changed by: Dorita Sciara, MD   methocarbamol 500 MG tablet Commonly known as: ROBAXIN Take 1 tablet (500 mg total) by mouth 3 (three) times daily as needed.   metoprolol succinate 100 MG 24 hr tablet Commonly known as: TOPROL-XL Take 1 tablet (100 mg total) by mouth in the morning and at bedtime.   metoprolol succinate 25  MG 24 hr tablet Commonly known as: Toprol XL Take 1 tablet (25 mg total) by mouth in the morning and at bedtime.   NON FORMULARY Diet: _____ Regular, ___  __ NAS, ___x____Consistent Carbohydrate, _______NPO _____Other   polyethylene glycol 17 g packet Commonly known as: MIRALAX / GLYCOLAX Take 17 g by mouth daily.   potassium chloride SA 20 MEQ tablet Commonly known as: KLOR-CON Take 1 tablet (20 mEq total) by mouth daily.   pregabalin 200 MG capsule Commonly known as: Lyrica Take 1 capsule (200 mg total) by mouth 2 (two) times daily.   Prolia 60 MG/ML Sosy injection Generic drug: denosumab Inject 60 mg into the skin every 6 (six) months.   Rybelsus 14 MG Tabs Generic drug: Semaglutide Take 14 mg by mouth daily. What changed:   medication strength  how much to take  when to take this Changed by: Dorita Sciara, MD   Venelex Oint Apply topically. Special Instructions: Apply to coccyx and bilateral buttocks q shift for prevention/ redness.   Vitamin D (Cholecalciferol) 25 MCG (1000 UT) Tabs Take 1 tablet by mouth daily in the afternoon.        ALLERGIES: Allergies  Allergen Reactions  . Ivp Dye [Iodinated Diagnostic Agents] Anaphylaxis and Swelling    Throat closes   . Codeine Other (See Comments)  . Iodine Other (See Comments)  . Latex     Latex tape pulls skin with it     REVIEW OF SYSTEMS: A comprehensive ROS was conducted with the patient and is negative except as per HPI    OBJECTIVE:   VITAL SIGNS: BP 140/70   Pulse 83   Ht 5\' 2"  (1.575 m)   Wt 189 lb 4 oz (85.8 kg)   SpO2 92%   BMI 34.61 kg/m    PHYSICAL EXAM:  General: Pt appears well and is in NAD  Hydration: Well-hydrated with moist mucous membranes and good skin turgor  Neck: General: Supple without adenopathy or carotid  bruits. Thyroid: Thyroid size normal.  No goiter or nodules appreciated.   Lungs: Clear with good BS bilat with no rales, rhonchi, or wheezes  Heart:  Tachycardic   Abdomen: Normoactive bowel sounds, soft, nontender, without masses or organomegaly palpable  Extremities:  Lower extremities - No pretibial edema. No lesions.  Skin: Normal texture and temperature to palpation. No rash noted.  Neuro: MS is good with appropriate affect, pt is alert and Ox3    DM foot exam: 09/24/2020  The skin of the feet is without sores or ulcerations, left 2nd toe deformity  The pedal pulses are 2+ on right and 2+ on left. The sensation is intact to a screening 5.07, 10 gram monofilament bilaterally   DATA REVIEWED:  Lab Results  Component Value Date   HGBA1C 9.1 (H) 08/09/2020   HGBA1C 10.9 (H) 04/23/2020   HGBA1C 7.1 (H) 08/29/2019   Lab Results  Component Value Date   MICROALBUR 1.9 07/23/2015   LDLCALC 62 08/29/2019   CREATININE 0.70 08/09/2020   Lab Results  Component Value Date   MICRALBCREAT <12 08/29/2019    Lab Results  Component Value Date   CHOL 128 08/29/2019   HDL 39 (L) 08/29/2019   LDLCALC 62 08/29/2019   LDLDIRECT 62 03/17/2016   TRIG 160 (H) 08/29/2019   CHOLHDL 3.3 08/29/2019      Results for SOLANGEL, MCMANAWAY (MRN [id number]) as of 09/24/2020 07:19  Ref. Range 08/09/2020 04:25  Sodium Latest Ref Range: 135 - 145 mmol/L 140  Potassium Latest Ref Range: 3.5 - 5.1 mmol/L 3.6  Chloride Latest Ref Range: 98 - 111 mmol/L 103  CO2 Latest Ref Range: 22 - 32 mmol/L 27  Glucose Latest Ref Range: 70 - 99 mg/dL 134 (H)  Mean Plasma Glucose Latest Units: mg/dL 214.47  BUN Latest Ref Range: 8 - 23 mg/dL 22  Creatinine Latest Ref Range: 0.44 - 1.00 mg/dL 0.70  Calcium Latest Ref Range: 8.9 - 10.3 mg/dL 8.9  Anion gap Latest Ref Range: 5 - 15  10  GFR, Estimated Latest Ref Range: >60 mL/min >60    ASSESSMENT / PLAN / RECOMMENDATIONS:   1) Type 2 Diabetes Mellitus, Poorly controlled, Without complications - Most recent A1c of 9.1 %. Goal A1c < 7.0 %.    Plan: GENERAL: I have discussed with the patient the pathophysiology  of diabetes. We went over the natural progression of the disease. We talked about both insulin resistance and insulin deficiency. We stressed the importance of lifestyle changes including diet and exercise. I explained the complications associated with diabetes including retinopathy, nephropathy, neuropathy as well as increased risk of cardiovascular disease. We went over the benefit seen with glycemic control.    I explained to the patient that diabetic patients are at higher than normal risk for amputations.   Will increase Metformin an Rybelsus as below   A prescription for dexcom sent    MEDICATIONS:  Increase Metformin 500 mg 2 tabs BID   Increase Rybelsus to 14 mg daily   Decrease Lantus to 45 units BID   Correction factor : Humalog ( BG -120/20)   EDUCATION / INSTRUCTIONS:  BG monitoring instructions: Patient is instructed to check her blood sugars 3 times a day, before meals .  Call [facility] if: BG persistently < 70 or > 300. . I reviewed the Rule of 15 for the treatment of hypoglycemia in detail with the patient. Literature supplied.   2) Diabetic complications:   Eye: Does  not have known diabetic retinopathy.   Neuro/ Feet: Does not have known diabetic peripheral neuropathy.  Renal: Patient does not have known baseline CKD. She is not on an ACEI/ARB at present.   3) Dyslipidemia:  - LDL at gaol of 62 mg/dL , Tg slightly elevated at 160 mg/dL   Patient is on atorvastatin 40 mg daily  She is also on Vascepa 1 g, 2 caps BID    F/U in 3 months    Signed electronically by: Mack Guise, MD  [facility]  [facility] [address] [place], [place] [address] Phone: [phone] FAX: [phone]   CC: [name], [place] [address] Phone: [phone]  Fax: [phone]    Return to Endocrinology clinic as below: Future Appointments  Date Time Provider  [place]  11/14/2020  3:25 PM [name], MD [facility] None  11/30/2020  1:00 PM Courtney Heys, MD [facility] CPR  11/30/2020  2:00 PM [facility]-ANNUAL WELLNESS VISIT [facility] None  02/27/2021  9:30 AM Cameron Sprang, MD [facility] None

## 2020-09-24 NOTE — Telephone Encounter (Signed)
So I don't need to call her back, since PCP addressed?- Just let me know- thanks, ML

## 2020-09-26 ENCOUNTER — Telehealth: Payer: Self-pay | Admitting: Student

## 2020-09-26 NOTE — Telephone Encounter (Signed)
Patient returning call to Ashley.

## 2020-09-26 NOTE — Telephone Encounter (Signed)
Pt returning call to schedule 73m OV with BS.

## 2020-09-27 DIAGNOSIS — E1140 Type 2 diabetes mellitus with diabetic neuropathy, unspecified: Secondary | ICD-10-CM | POA: Diagnosis not present

## 2020-09-27 DIAGNOSIS — G894 Chronic pain syndrome: Secondary | ICD-10-CM | POA: Diagnosis not present

## 2020-09-27 DIAGNOSIS — I5032 Chronic diastolic (congestive) heart failure: Secondary | ICD-10-CM | POA: Diagnosis not present

## 2020-09-27 DIAGNOSIS — E1165 Type 2 diabetes mellitus with hyperglycemia: Secondary | ICD-10-CM | POA: Diagnosis not present

## 2020-09-27 DIAGNOSIS — M461 Sacroiliitis, not elsewhere classified: Secondary | ICD-10-CM | POA: Diagnosis not present

## 2020-09-27 DIAGNOSIS — I110 Hypertensive heart disease with heart failure: Secondary | ICD-10-CM | POA: Diagnosis not present

## 2020-10-01 ENCOUNTER — Telehealth: Payer: Self-pay | Admitting: Neurology

## 2020-10-01 ENCOUNTER — Other Ambulatory Visit: Payer: Self-pay

## 2020-10-01 MED ORDER — EMGALITY 120 MG/ML ~~LOC~~ SOAJ: SUBCUTANEOUS | 11 refills | Status: DC

## 2020-10-01 NOTE — Telephone Encounter (Signed)
F/u   Patient husband calling back to speak with the nurse.

## 2020-10-01 NOTE — Telephone Encounter (Signed)
Patient wants to start a new medicine for emgailty, has never been on it, I advised to discuss on follow up or see PCP.

## 2020-10-01 NOTE — Telephone Encounter (Signed)
No answer, unable to leave message, what pharmacy and what medication is it.

## 2020-10-04 DIAGNOSIS — G894 Chronic pain syndrome: Secondary | ICD-10-CM | POA: Diagnosis not present

## 2020-10-04 DIAGNOSIS — I110 Hypertensive heart disease with heart failure: Secondary | ICD-10-CM | POA: Diagnosis not present

## 2020-10-04 DIAGNOSIS — I5032 Chronic diastolic (congestive) heart failure: Secondary | ICD-10-CM | POA: Diagnosis not present

## 2020-10-04 DIAGNOSIS — E1165 Type 2 diabetes mellitus with hyperglycemia: Secondary | ICD-10-CM | POA: Diagnosis not present

## 2020-10-04 DIAGNOSIS — M461 Sacroiliitis, not elsewhere classified: Secondary | ICD-10-CM | POA: Diagnosis not present

## 2020-10-04 DIAGNOSIS — E1140 Type 2 diabetes mellitus with diabetic neuropathy, unspecified: Secondary | ICD-10-CM | POA: Diagnosis not present

## 2020-10-09 DIAGNOSIS — M79672 Pain in left foot: Secondary | ICD-10-CM | POA: Diagnosis not present

## 2020-10-09 DIAGNOSIS — S92415D Nondisplaced fracture of proximal phalanx of left great toe, subsequent encounter for fracture with routine healing: Secondary | ICD-10-CM | POA: Diagnosis not present

## 2020-10-10 DIAGNOSIS — E1165 Type 2 diabetes mellitus with hyperglycemia: Secondary | ICD-10-CM | POA: Diagnosis not present

## 2020-10-10 DIAGNOSIS — G894 Chronic pain syndrome: Secondary | ICD-10-CM | POA: Diagnosis not present

## 2020-10-10 DIAGNOSIS — E1140 Type 2 diabetes mellitus with diabetic neuropathy, unspecified: Secondary | ICD-10-CM | POA: Diagnosis not present

## 2020-10-10 DIAGNOSIS — I110 Hypertensive heart disease with heart failure: Secondary | ICD-10-CM | POA: Diagnosis not present

## 2020-10-10 DIAGNOSIS — I5032 Chronic diastolic (congestive) heart failure: Secondary | ICD-10-CM | POA: Diagnosis not present

## 2020-10-10 DIAGNOSIS — M461 Sacroiliitis, not elsewhere classified: Secondary | ICD-10-CM | POA: Diagnosis not present

## 2020-10-13 DIAGNOSIS — M461 Sacroiliitis, not elsewhere classified: Secondary | ICD-10-CM | POA: Diagnosis not present

## 2020-10-13 DIAGNOSIS — R296 Repeated falls: Secondary | ICD-10-CM | POA: Diagnosis not present

## 2020-10-13 DIAGNOSIS — J449 Chronic obstructive pulmonary disease, unspecified: Secondary | ICD-10-CM | POA: Diagnosis not present

## 2020-10-13 DIAGNOSIS — I2510 Atherosclerotic heart disease of native coronary artery without angina pectoris: Secondary | ICD-10-CM | POA: Diagnosis not present

## 2020-10-13 DIAGNOSIS — E1165 Type 2 diabetes mellitus with hyperglycemia: Secondary | ICD-10-CM | POA: Diagnosis not present

## 2020-10-13 DIAGNOSIS — M797 Fibromyalgia: Secondary | ICD-10-CM | POA: Diagnosis not present

## 2020-10-13 DIAGNOSIS — I482 Chronic atrial fibrillation, unspecified: Secondary | ICD-10-CM | POA: Diagnosis not present

## 2020-10-13 DIAGNOSIS — F319 Bipolar disorder, unspecified: Secondary | ICD-10-CM | POA: Diagnosis not present

## 2020-10-13 DIAGNOSIS — I5032 Chronic diastolic (congestive) heart failure: Secondary | ICD-10-CM | POA: Diagnosis not present

## 2020-10-13 DIAGNOSIS — Z7901 Long term (current) use of anticoagulants: Secondary | ICD-10-CM | POA: Diagnosis not present

## 2020-10-13 DIAGNOSIS — M6281 Muscle weakness (generalized): Secondary | ICD-10-CM | POA: Diagnosis not present

## 2020-10-13 DIAGNOSIS — R262 Difficulty in walking, not elsewhere classified: Secondary | ICD-10-CM | POA: Diagnosis not present

## 2020-10-13 DIAGNOSIS — G894 Chronic pain syndrome: Secondary | ICD-10-CM | POA: Diagnosis not present

## 2020-10-13 DIAGNOSIS — E1140 Type 2 diabetes mellitus with diabetic neuropathy, unspecified: Secondary | ICD-10-CM | POA: Diagnosis not present

## 2020-10-13 DIAGNOSIS — I110 Hypertensive heart disease with heart failure: Secondary | ICD-10-CM | POA: Diagnosis not present

## 2020-10-13 DIAGNOSIS — Z7984 Long term (current) use of oral hypoglycemic drugs: Secondary | ICD-10-CM | POA: Diagnosis not present

## 2020-10-16 ENCOUNTER — Telehealth: Payer: Self-pay

## 2020-10-16 ENCOUNTER — Telehealth: Payer: Self-pay | Admitting: Internal Medicine

## 2020-10-16 DIAGNOSIS — Z794 Long term (current) use of insulin: Secondary | ICD-10-CM

## 2020-10-16 DIAGNOSIS — E0865 Diabetes mellitus due to underlying condition with hyperglycemia: Secondary | ICD-10-CM

## 2020-10-16 MED ORDER — HYDROMORPHONE HCL 2 MG PO TABS: 2.0000 mg | ORAL_TABLET | Freq: Four times a day (QID) | ORAL | 0 refills | Status: DC | PRN

## 2020-10-16 NOTE — Telephone Encounter (Signed)
Spoken to patient and notified Dr Shamleffer's comments. Verbalized understanding.   

## 2020-10-16 NOTE — Telephone Encounter (Signed)
Please let her know that I put the referral in, the diabetic educator will call her separately to schedule that appointment.

## 2020-10-16 NOTE — Telephone Encounter (Signed)
Amber Stephenson is requesting Hydromorphone medication per PMP Last filled 09/07/2020. Next appt is 11/30/2020.  If any questions she can be reached at [phone]

## 2020-10-16 NOTE — Telephone Encounter (Signed)
Pt calling in stating that she has gotten approved for the dexcom  dexcom has called and stated approval.  Pt would like to set up an app.  Pt would like a call back as soon as possible

## 2020-10-16 NOTE — Telephone Encounter (Signed)
Schedule dexcom training for 10/17/20

## 2020-10-17 ENCOUNTER — Encounter: Payer: Medicare Other | Attending: Internal Medicine | Admitting: Nutrition

## 2020-10-17 ENCOUNTER — Other Ambulatory Visit: Payer: Self-pay

## 2020-10-17 DIAGNOSIS — E1165 Type 2 diabetes mellitus with hyperglycemia: Secondary | ICD-10-CM | POA: Insufficient documentation

## 2020-10-17 DIAGNOSIS — E1143 Type 2 diabetes mellitus with diabetic autonomic (poly)neuropathy: Secondary | ICD-10-CM | POA: Insufficient documentation

## 2020-10-17 DIAGNOSIS — IMO0002 Reserved for concepts with insufficient information to code with codable children: Secondary | ICD-10-CM

## 2020-10-17 NOTE — Patient Instructions (Signed)
Read over instruction booklet Change sensor every 10 days Change transmitter every 3 months Call Dexcom help line if questions.

## 2020-10-17 NOTE — Progress Notes (Signed)
Patient is here with her husband to learn how to use the Dexcom sensor Her phone was not able to download the Dexcom app, but husband was able to download it on his phone.  They were trained on how/where to insert this and how to attach the transmitter.  They inserted the sensor into her left upper abdomen, and was reminded to not inject insulin within 4 inches of this site.  They reported good understanding of this.  The sensor was linked to her husband's phone and to [place].  We reviewed the need to reuse the transmitter and they reported good understanding of this.   They reported that they do not have the sensors as yet.  The order was sent to [facility] on 4/28.  They were given the telephone number to call to see what/where the prescription was sent.  They had no final questions.

## 2020-10-18 ENCOUNTER — Telehealth: Payer: Self-pay | Admitting: Family Medicine

## 2020-10-18 ENCOUNTER — Encounter: Payer: Self-pay | Admitting: Family Medicine

## 2020-10-18 ENCOUNTER — Ambulatory Visit (INDEPENDENT_AMBULATORY_CARE_PROVIDER_SITE_OTHER): Payer: Medicare Other | Admitting: Family Medicine

## 2020-10-18 ENCOUNTER — Other Ambulatory Visit: Payer: Self-pay | Admitting: *Deleted

## 2020-10-18 DIAGNOSIS — R3989 Other symptoms and signs involving the genitourinary system: Secondary | ICD-10-CM | POA: Diagnosis not present

## 2020-10-18 MED ORDER — CEFDINIR 300 MG PO CAPS: 300.0000 mg | ORAL_CAPSULE | Freq: Two times a day (BID) | ORAL | 0 refills | Status: AC

## 2020-10-18 NOTE — Progress Notes (Signed)
Virtual Visit via Telephone Note  I connected with Amber Stephenson on 10/18/20 at 1:54 PM by telephone and verified that I am speaking with the correct person using two identifiers. Amber Stephenson is currently located at home and her husband is currently with her during this visit. The provider, Loman Brooklyn, FNP is located in their office at time of visit.  I discussed the limitations, risks, security and privacy concerns of performing an evaluation and management service by telephone and the availability of in person appointments. I also discussed with the patient that there may be a patient responsible charge related to this service. The patient expressed understanding and agreed to proceed.  Subjective: PCP: [name], MD  Chief Complaint  Patient presents with  . Urinary Tract Infection   Urinary Tract Infection: Patient complains of abnormal smelling urine and dysuria. She has had symptoms for 2 weeks. Patient denies back pain, fever and stomach ache. Patient does not have a history of recurrent UTI.  Patient does not have a history of pyelonephritis.    ROS: Per HPI  Current Outpatient Medications:  .  albuterol (PROVENTIL) (2.5 MG/3ML) 0.083% nebulizer solution, Take 3 mLs (2.5 mg total) by nebulization every 6 (six) hours as needed for wheezing or shortness of breath., Disp: 150 mL, Rfl: 0 .  albuterol (VENTOLIN HFA) 108 (90 Base) MCG/ACT inhaler, Inhale 2 puffs into the lungs every 6 (six) hours as needed for wheezing or shortness of breath., Disp: 1 each, Rfl: 0 .  apixaban (ELIQUIS) 5 MG TABS tablet, Take 1 tablet (5 mg total) by mouth 2 (two) times daily., Disp: 60 tablet, Rfl: 0 .  ARIPiprazole (ABILIFY) 10 MG tablet, Take 3 tablets (30 mg total) by mouth daily., Disp: 90 tablet, Rfl: 0 .  atorvastatin (LIPITOR) 40 MG tablet, Take 1 tablet (40 mg total) by mouth daily., Disp: 30 tablet, Rfl: 0 .  Balsam [place]-Castor Oil (VENELEX) OINT, Apply topically. Special  Instructions: Apply to coccyx and bilateral buttocks q shift for prevention/ redness., Disp: , Rfl:  .  carbamazepine (CARBATROL) 100 MG 12 hr capsule, Take by mouth at bedtime., Disp: , Rfl:  .  Continuous Blood Gluc Sensor (DEXCOM G6 SENSOR) MISC, 1 Device by Does not apply route as directed., Disp: 9 each, Rfl: 3 .  Continuous Blood Gluc Transmit (DEXCOM G6 TRANSMITTER) MISC, 1 Device by Does not apply route as directed., Disp: 1 each, Rfl: 3 .  denosumab (PROLIA) 60 MG/ML SOSY injection, Inject 60 mg into the skin every 6 (six) months., Disp: 1 mL, Rfl: 1 .  diltiazem (CARDIZEM CD) 120 MG 24 hr capsule, Take 1 capsule (120 mg total) by mouth at bedtime., Disp: 90 capsule, Rfl: 2 .  escitalopram (LEXAPRO) 10 MG tablet, Take 1 tablet (10 mg total) by mouth daily., Disp: 30 tablet, Rfl: 0 .  Fluticasone-Salmeterol (ADVAIR DISKUS) 500-50 MCG/DOSE AEPB, Inhale 1 puff into the lungs in the morning and at bedtime., Disp: 60 each, Rfl: 0 .  furosemide (LASIX) 20 MG tablet, Take 0.5 tablets (10 mg total) by mouth daily. TAKE 1 TABLET EVERY MORNING, Disp: , Rfl:  .  Galcanezumab-gnlm (EMGALITY) 120 MG/ML SOAJ, Administer once a month starting 09/27/2020, Disp: 1.12 mL, Rfl: 11 .  HYDROmorphone (DILAUDID) 2 MG tablet, Take 1 tablet (2 mg total) by mouth every 6 (six) hours as needed for severe pain., Disp: 120 tablet, Rfl: 0 .  icosapent Ethyl (VASCEPA) 1 g capsule, TAKE 2 CAPSULES TWICE A DAY  WITH MEALS, Disp: 120 capsule, Rfl: 0 .  insulin glargine (LANTUS SOLOSTAR) 100 UNIT/ML Solostar Pen, Inject 45 Units into the skin 2 (two) times daily., Disp: 30 mL, Rfl: 2 .  insulin lispro (HUMALOG KWIKPEN) 100 UNIT/ML KwikPen, With each meal check glucose. If glucose is <100. Use 0 units. 101-200 use 5 units. Over 200 use 10 units, Disp: 15 mL, Rfl: 3 .  LORazepam (ATIVAN) 1 MG tablet, Take 1 tablet (1 mg total) by mouth 2 (two) times daily. And give 2 tablets by mouth at bedtime, Disp: 90 tablet, Rfl: 0 .   meclizine (ANTIVERT) 12.5 MG tablet, Take 1 tablet (12.5 mg total) by mouth 3 (three) times daily as needed for dizziness., Disp: 30 tablet, Rfl: 0 .  metFORMIN (GLUCOPHAGE) 500 MG tablet, Take 2 tablets (1,000 mg total) by mouth 2 (two) times daily with a meal., Disp: 360 tablet, Rfl: 3 .  methocarbamol (ROBAXIN) 500 MG tablet, Take 1 tablet (500 mg total) by mouth 3 (three) times daily as needed., Disp: 90 tablet, Rfl: 0 .  metoprolol succinate (TOPROL XL) 25 MG 24 hr tablet, Take 1 tablet (25 mg total) by mouth in the morning and at bedtime., Disp: 180 tablet, Rfl: 3 .  metoprolol succinate (TOPROL-XL) 100 MG 24 hr tablet, Take 1 tablet (100 mg total) by mouth in the morning and at bedtime., Disp: 60 tablet, Rfl: 0 .  NON FORMULARY, Diet: _____ Regular, ___  __ NAS, ___x____Consistent Carbohydrate, _______NPO _____Other, Disp: , Rfl:  .  polyethylene glycol (MIRALAX / GLYCOLAX) 17 g packet, Take 17 g by mouth daily., Disp: 14 each, Rfl: 0 .  potassium chloride SA (KLOR-CON) 20 MEQ tablet, Take 1 tablet (20 mEq total) by mouth daily., Disp: 30 tablet, Rfl: 0 .  pregabalin (LYRICA) 200 MG capsule, Take 1 capsule (200 mg total) by mouth 2 (two) times daily., Disp: 60 capsule, Rfl: 0 .  Semaglutide (RYBELSUS) 14 MG TABS, Take 14 mg by mouth daily., Disp: 90 tablet, Rfl: 1 .  Vitamin D, Cholecalciferol, 25 MCG (1000 UT) TABS, Take 1 tablet by mouth daily in the afternoon., Disp: , Rfl:  No current facility-administered medications for this visit.  Facility-Administered Medications Ordered in Other Visits:  .  bupivacaine-epinephrine (MARCAINE W/ EPI) 0.5% -1:200000 injection, , , Anesthesia Intra-op, Effie Berkshire, MD, 12 mL at 01/21/19 0935  Allergies  Allergen Reactions  . Ivp Dye [Iodinated Diagnostic Agents] Anaphylaxis and Swelling    Throat closes   . Codeine Other (See Comments)  . Iodine Other (See Comments)  . Latex     Latex tape pulls skin with it   Past Medical History:   Diagnosis Date  . Acute on chronic respiratory failure with hypoxia ([place]) 01/22/2019  . Atrial fibrillation with RVR 05/19/2017  . Bipolar I disorder 01/23/2015  . CAD (coronary artery disease)    Nonobstructive; Managed by Dr. Bronson Ing  . Cardiomegaly 01/12/2018  . Chronic anticoagulation 05/30/2015  . Chronic atrial fibrillation ([place]) 01/04/2015  . Chronic back pain    Lower back  . Chronic diastolic CHF (congestive heart failure) 05/30/2015  . Diabetes mellitus, type 2, without complication   . Diabetic neuropathy 02/06/2016  . Dysrhythmia    A-Fib  . Essential hypertension   . Herpes genitalis in women 07/16/2015  . Hyperlipidemia   . Hypoxia 02/01/2019  . Insomnia 01/23/2015  . Lipoma 02/08/2015  . Major depressive disorder   . Migraine headache with aura 02/12/2016  . 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 NAFL (nonalcoholic fatty liver) 5/00/9381  . OSA (obstructive sleep apnea) 02/24/2019   10/09/2018 - HST  - AHI 40.6   . Pulmonary hypertension   . Renal insufficiency    Managed by Dr. Wallace Keller  . RLS (restless legs syndrome) 04/27/2015    Observations/Objective: A&O  No respiratory distress or wheezing audible over the phone Mood, judgement, and thought processes all WNL   Assessment and Plan: 1. Suspected UTI Encouraged adequate hydration.  - cefdinir (OMNICEF) 300 MG capsule; Take 1 capsule (300 mg total) by mouth 2 (two) times daily for 7 days.  Dispense: 14 capsule; Refill: 0   Follow Up Instructions: I discussed the assessment and treatment plan with the patient. The patient was provided an opportunity to ask questions and all were answered. The patient agreed with the plan and demonstrated an understanding of the instructions.   The patient was advised to call back or seek an in-person evaluation if the symptoms worsen or if the condition fails to improve as anticipated.  The above assessment and management plan was discussed with the patient.  The patient verbalized understanding of and has agreed to the management plan. Patient is aware to call the clinic if symptoms persist or worsen. Patient is aware when to return to the clinic for a follow-up visit. Patient educated on when it is appropriate to go to the emergency department.   Time call ended: 1:59 PM  I provided 5 minutes of non-face-to-face time during this encounter.  Hendricks Limes, MSN, APRN, FNP-C [facility] 10/18/20

## 2020-10-18 NOTE — Telephone Encounter (Signed)
noted 

## 2020-10-18 NOTE — Telephone Encounter (Signed)
Patients husband came in to pick up a Dexcom G6 sensor left for patient by Leonia Reader.  Could not find one in the cabinet. Provided one from closet - OK'd per Orpha Bur

## 2020-10-19 DIAGNOSIS — I5032 Chronic diastolic (congestive) heart failure: Secondary | ICD-10-CM | POA: Diagnosis not present

## 2020-10-19 DIAGNOSIS — I110 Hypertensive heart disease with heart failure: Secondary | ICD-10-CM | POA: Diagnosis not present

## 2020-10-19 DIAGNOSIS — E1165 Type 2 diabetes mellitus with hyperglycemia: Secondary | ICD-10-CM | POA: Diagnosis not present

## 2020-10-19 DIAGNOSIS — G894 Chronic pain syndrome: Secondary | ICD-10-CM | POA: Diagnosis not present

## 2020-10-19 DIAGNOSIS — M461 Sacroiliitis, not elsewhere classified: Secondary | ICD-10-CM | POA: Diagnosis not present

## 2020-10-19 DIAGNOSIS — E1140 Type 2 diabetes mellitus with diabetic neuropathy, unspecified: Secondary | ICD-10-CM | POA: Diagnosis not present

## 2020-10-19 NOTE — Telephone Encounter (Signed)
Benefit verification sent to [employer] for employee cost.

## 2020-10-23 ENCOUNTER — Ambulatory Visit (INDEPENDENT_AMBULATORY_CARE_PROVIDER_SITE_OTHER): Payer: Medicare Other | Admitting: Family Medicine

## 2020-10-23 ENCOUNTER — Encounter: Payer: Self-pay | Admitting: Family Medicine

## 2020-10-23 DIAGNOSIS — K591 Functional diarrhea: Secondary | ICD-10-CM | POA: Diagnosis not present

## 2020-10-23 MED ORDER — DIPHENOXYLATE-ATROPINE 2.5-0.025 MG PO TABS: 2.0000 | ORAL_TABLET | Freq: Four times a day (QID) | ORAL | 0 refills | Status: DC | PRN

## 2020-10-23 NOTE — Progress Notes (Signed)
Subjective:    Patient ID: Amber Stephenson, female    DOB: [birth year], 75 y.o.   MRN: [id number]   HPI: Amber Stephenson is a 75 y.o. female presenting for diarrhea for two days. Awakening her in the night. Grainy and watery. Denies abd pain. Denies fever. No appetite. No nausea or vomiting. Started an antibiotic, omnicef 5 days ago for UTI. No melena. No hematochezia. UTI sx have resolved.    Depression screen [facility] 2/9 09/17/2020 09/07/2020 09/05/2020 08/14/2020 06/13/2020  Decreased Interest 0 0 0 0 0  Down, Depressed, Hopeless 0 0 0 0 0  PHQ - 2 Score 0 0 0 0 0  Altered sleeping 0 - - - 3  Tired, decreased energy 0 - - - 2  Change in appetite 0 - - - 3  Feeling bad or failure about yourself  0 - - - 0  Trouble concentrating 0 - - - 0  Moving slowly or fidgety/restless 0 - - - 0  Suicidal thoughts 0 - - - 0  PHQ-9 Score 0 - - - 8  Difficult doing work/chores - - - - -  Some recent data might be hidden     Relevant past medical, surgical, family and social history reviewed and updated as indicated.  Interim medical history since our last visit reviewed. Allergies and medications reviewed and updated.  ROS:  Review of Systems  Constitutional: Negative.   HENT: Negative.   Eyes: Positive for discharge. Negative for visual disturbance.  Respiratory: Negative for shortness of breath.   Cardiovascular: Negative for chest pain.  Gastrointestinal: Positive for diarrhea. Negative for abdominal distention, abdominal pain, anal bleeding, blood in stool, nausea, rectal pain and vomiting.  Musculoskeletal: Negative for arthralgias.     Social History   Tobacco Use  Smoking Status Never Smoker  Smokeless Tobacco Never Used       Objective:     Wt Readings from Last 3 Encounters:  09/24/20 189 lb 4 oz (85.8 kg)  09/17/20 201 lb (91.2 kg)  09/07/20 199 lb (90.3 kg)     Exam deferred. Pt. Harboring due to COVID 19. Phone visit performed.   Assessment & Plan:   1.  Functional diarrhea     Meds ordered this encounter  Medications  . diphenoxylate-atropine (LOMOTIL) 2.5-0.025 MG tablet    Sig: Take 2 tablets by mouth 4 (four) times daily as needed for diarrhea or loose stools.    Dispense:  30 tablet    Refill:  0    No orders of the defined types were placed in this encounter.     Diagnoses and all orders for this visit:  Functional diarrhea  Other orders -     diphenoxylate-atropine (LOMOTIL) 2.5-0.025 MG tablet; Take 2 tablets by mouth 4 (four) times daily as needed for diarrhea or loose stools.    Virtual Visit via telephone Note  I discussed the limitations, risks, security and privacy concerns of performing an evaluation and management service by telephone and the availability of in person appointments. The patient was identified with two identifiers. Pt.expressed understanding and agreed to proceed. Pt. Is at home. Dr. Livia Snellen is in his office.  Follow Up Instructions:   I discussed the assessment and treatment plan with the patient. The patient was provided an opportunity to ask questions and all were answered. The patient agreed with the plan and demonstrated an understanding of the instructions.   The patient was advised to call back or seek an  in-person evaluation if the symptoms worsen or if the condition fails to improve as anticipated.   Total minutes including chart review and phone contact time: 14    Follow up plan: Return if symptoms worsen or fail to improve.  [name], MD [place]

## 2020-10-24 ENCOUNTER — Other Ambulatory Visit: Payer: Self-pay | Admitting: Family Medicine

## 2020-10-25 ENCOUNTER — Other Ambulatory Visit: Payer: Self-pay | Admitting: Family Medicine

## 2020-10-26 ENCOUNTER — Other Ambulatory Visit: Payer: Self-pay

## 2020-10-26 ENCOUNTER — Encounter: Payer: Self-pay | Admitting: Nurse Practitioner

## 2020-10-26 ENCOUNTER — Ambulatory Visit (INDEPENDENT_AMBULATORY_CARE_PROVIDER_SITE_OTHER): Payer: Medicare Other | Admitting: Nurse Practitioner

## 2020-10-26 VITALS — BP 150/90 | HR 73 | Temp 97.0°F | Ht 62.0 in | Wt 195.0 lb

## 2020-10-26 DIAGNOSIS — K591 Functional diarrhea: Secondary | ICD-10-CM

## 2020-10-26 HISTORY — DX: Functional diarrhea: K59.1

## 2020-10-26 MED ORDER — PROBIOTIC (LACTOBACILLUS) PO CAPS: 1.0000 | ORAL_CAPSULE | Freq: Every day | ORAL | 1 refills | Status: DC

## 2020-10-26 MED ORDER — DIPHENOXYLATE-ATROPINE 2.5-0.025 MG PO TABS: 2.0000 | ORAL_TABLET | Freq: Four times a day (QID) | ORAL | 0 refills | Status: DC | PRN

## 2020-10-26 NOTE — Progress Notes (Signed)
Acute Office Visit  Subjective:    Patient ID: Amber Stephenson, female    DOB: [birth year], 75 y.o.   MRN: [id number]  Chief Complaint  Patient presents with  . Diarrhea    Diarrhea  This is a recurrent problem. The current episode started in the past 7 days. The problem occurs 2 to 4 times per day. The problem has been unchanged. The stool consistency is described as watery. Associated symptoms include abdominal pain. Pertinent negatives include no coughing, increased  flatus or vomiting. Nothing aggravates the symptoms. She has tried anti-motility drug for the symptoms. The treatment provided mild relief.     Past Medical History:  Diagnosis Date  . Acute on chronic respiratory failure with hypoxia ([place]) 01/22/2019  . Atrial fibrillation with RVR 05/19/2017  . Bipolar I disorder 01/23/2015  . CAD (coronary artery disease)    Nonobstructive; Managed by Dr. Bronson Ing  . Cardiomegaly 01/12/2018  . Chronic anticoagulation 05/30/2015  . Chronic atrial fibrillation ([place]) 01/04/2015  . Chronic back pain    Lower back  . Chronic diastolic CHF (congestive heart failure) 05/30/2015  . Diabetes mellitus, type 2, without complication   . Diabetic neuropathy 02/06/2016  . Dysrhythmia    A-Fib  . Essential hypertension   . Herpes genitalis in women 07/16/2015  . Hyperlipidemia   . Hypoxia 02/01/2019  . Insomnia 01/23/2015  . Lipoma 02/08/2015  . Major depressive disorder   . Migraine headache with aura 02/12/2016  . 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[place]) 04/21/2019  . NAFL (nonalcoholic fatty liver) 8/46/6599  . OSA (obstructive sleep apnea) 02/24/2019   10/09/2018 - HST  - AHI 40.6   . Pulmonary hypertension   . Renal insufficiency    Managed by Dr. Wallace Keller  . RLS (restless legs syndrome) 04/27/2015    Past Surgical History:  Procedure Laterality Date  . BREAST REDUCTION SURGERY    . EYE SURGERY Right    cateracts  . HAMMER TOE SURGERY    . LEFT HEART CATH AND CORONARY  ANGIOGRAPHY N/A 02/03/2018   Procedure: LEFT HEART CATH AND CORONARY ANGIOGRAPHY;  Surgeon: Martinique, Peter M, MD;  Location: [facility] [place] [facility];  Service: Cardiovascular;  Laterality: N/A;  . REVERSE SHOULDER ARTHROPLASTY Right 08/19/2019   Procedure: REVERSE SHOULDER ARTHROPLASTY;  Surgeon: Netta Cedars, MD;  Location: [facility];  Service: Orthopedics;  Laterality: Right;  interscalene block  . SHOULDER SURGERY Right    "I BROKE MY SHOUDLER  . THIGH SURGERY     "TO REMOVE A TUMOR "    Family History  Problem Relation Age of Onset  . Diabetes Mother   . Heart disease Mother   . Heart disease Brother   . Heart disease Sister        CABG  . Diabetes Sister   . Alzheimer's disease Father   . Alcohol abuse Sister   . Mental illness Brother   . Diabetes Brother     Social History   Socioeconomic History  . Marital status: Married    Spouse name: Orpah Greek  . Number of children: 3  . Years of education: 15  . Highest education level: Bachelor's degree (e.g., BA, AB, BS)  Occupational History  . Occupation: Retired    Comment: marketing  Tobacco Use  . Smoking status: Never Smoker  . Smokeless tobacco: Never Used  Vaping Use  . Vaping Use: Never used  Substance and Sexual Activity  . Alcohol use: Not Currently    Alcohol/week: 0.0 standard  drinks  . Drug use: No  . Sexual activity: Yes    Partners: Male    Birth control/protection: Post-menopausal  Other Topics Concern  . Not on file  Social History Narrative   Lives at home with husband.       Right handed   Social Determinants of Health   Financial Resource Strain: Not on file  Food Insecurity: Not on file  Transportation Needs: Not on file  Physical Activity: Not on file  Stress: Not on file  Social Connections: Not on file  Intimate Partner Violence: Not on file    Outpatient Medications Prior to Visit  Medication Sig Dispense Refill  . albuterol (PROVENTIL) (2.5 MG/3ML) 0.083% nebulizer solution Take 3 mLs (2.5  mg total) by nebulization every 6 (six) hours as needed for wheezing or shortness of breath. 150 mL 0  . albuterol (VENTOLIN HFA) 108 (90 Base) MCG/ACT inhaler Inhale 2 puffs into the lungs every 6 (six) hours as needed for wheezing or shortness of breath. 1 each 0  . apixaban (ELIQUIS) 5 MG TABS tablet Take 1 tablet (5 mg total) by mouth 2 (two) times daily. 60 tablet 0  . ARIPiprazole (ABILIFY) 10 MG tablet Take 3 tablets (30 mg total) by mouth daily. 90 tablet 0  . atorvastatin (LIPITOR) 40 MG tablet Take 1 tablet (40 mg total) by mouth daily. 30 tablet 0  . Balsam [place]-Castor Oil (VENELEX) OINT Apply topically. Special Instructions: Apply to coccyx and bilateral buttocks q shift for prevention/ redness.    . carbamazepine (CARBATROL) 100 MG 12 hr capsule Take by mouth at bedtime.    . Continuous Blood Gluc Sensor (DEXCOM G6 SENSOR) MISC 1 Device by Does not apply route as directed. 9 each 3  . Continuous Blood Gluc Transmit (DEXCOM G6 TRANSMITTER) MISC 1 Device by Does not apply route as directed. 1 each 3  . denosumab (PROLIA) 60 MG/ML SOSY injection Inject 60 mg into the skin every 6 (six) months. 1 mL 1  . diltiazem (CARDIZEM CD) 120 MG 24 hr capsule Take 1 capsule (120 mg total) by mouth at bedtime. 90 capsule 2  . diphenoxylate-atropine (LOMOTIL) 2.5-0.025 MG tablet Take 2 tablets by mouth 4 (four) times daily as needed for diarrhea or loose stools. 30 tablet 0  . escitalopram (LEXAPRO) 10 MG tablet Take 1 tablet (10 mg total) by mouth daily. 30 tablet 0  . Fluticasone-Salmeterol (ADVAIR DISKUS) 500-50 MCG/DOSE AEPB Inhale 1 puff into the lungs in the morning and at bedtime. 60 each 0  . furosemide (LASIX) 20 MG tablet Take 0.5 tablets (10 mg total) by mouth daily. TAKE 1 TABLET EVERY MORNING    . Galcanezumab-gnlm (EMGALITY) 120 MG/ML SOAJ Administer once a month starting 09/27/2020 1.12 mL 11  . HYDROmorphone (DILAUDID) 2 MG tablet Take 1 tablet (2 mg total) by mouth every 6 (six) hours as  needed for severe pain. 120 tablet 0  . icosapent Ethyl (VASCEPA) 1 g capsule TAKE 2 CAPSULES TWICE A DAY WITH MEALS 120 capsule 0  . insulin glargine (LANTUS SOLOSTAR) 100 UNIT/ML Solostar Pen Inject 45 Units into the skin 2 (two) times daily. 30 mL 2  . insulin lispro (HUMALOG KWIKPEN) 100 UNIT/ML KwikPen With each meal check glucose. If glucose is <100. Use 0 units. 101-200 use 5 units. Over 200 use 10 units 15 mL 3  . LORazepam (ATIVAN) 1 MG tablet Take 1 tablet (1 mg total) by mouth 2 (two) times daily. And give 2 tablets by  mouth at bedtime 90 tablet 0  . meclizine (ANTIVERT) 12.5 MG tablet Take 1 tablet (12.5 mg total) by mouth 3 (three) times daily as needed for dizziness. 30 tablet 0  . metFORMIN (GLUCOPHAGE) 500 MG tablet Take 2 tablets (1,000 mg total) by mouth 2 (two) times daily with a meal. 360 tablet 3  . methocarbamol (ROBAXIN) 500 MG tablet Take 1 tablet (500 mg total) by mouth 3 (three) times daily as needed. 90 tablet 0  . metoprolol succinate (TOPROL XL) 25 MG 24 hr tablet Take 1 tablet (25 mg total) by mouth in the morning and at bedtime. 180 tablet 3  . metoprolol succinate (TOPROL-XL) 100 MG 24 hr tablet Take 1 tablet (100 mg total) by mouth in the morning and at bedtime. 60 tablet 0  . NON FORMULARY Diet: _____ Regular, ___  __ NAS, ___x____Consistent Carbohydrate, _______NPO _____Other    . polyethylene glycol (MIRALAX / GLYCOLAX) 17 g packet Take 17 g by mouth daily. 14 each 0  . potassium chloride SA (KLOR-CON) 20 MEQ tablet Take 1 tablet (20 mEq total) by mouth daily. 30 tablet 0  . pregabalin (LYRICA) 200 MG capsule Take 1 capsule (200 mg total) by mouth 2 (two) times daily. 60 capsule 0  . Semaglutide (RYBELSUS) 14 MG TABS Take 14 mg by mouth daily. 90 tablet 1  . Vitamin D, Cholecalciferol, 25 MCG (1000 UT) TABS Take 1 tablet by mouth daily in the afternoon.     Facility-Administered Medications Prior to Visit  Medication Dose Route Frequency Provider Last Rate  Last Admin  . bupivacaine-epinephrine (MARCAINE W/ EPI) 0.5% -1:200000 injection    Anesthesia Intra-op Effie Berkshire, MD   12 mL at 01/21/19 0935    Allergies  Allergen Reactions  . Ivp Dye [Iodinated Diagnostic Agents] Anaphylaxis and Swelling    Throat closes   . Codeine Other (See Comments)  . Iodine Other (See Comments)  . Latex     Latex tape pulls skin with it    Review of Systems  Constitutional: Negative.   HENT: Negative.   Respiratory: Negative for cough.   Gastrointestinal: Positive for abdominal pain and diarrhea. Negative for flatus and vomiting.  All other systems reviewed and are negative.      Objective:    Physical Exam Vitals and nursing note reviewed.  Constitutional:      Appearance: Normal appearance.  HENT:     Head: Normocephalic.     Nose: Nose normal.  Cardiovascular:     Rate and Rhythm: Normal rate and regular rhythm.     Pulses: Normal pulses.     Heart sounds: Normal heart sounds.  Pulmonary:     Effort: Pulmonary effort is normal.     Breath sounds: Normal breath sounds.  Abdominal:     General: Bowel sounds are normal.     Tenderness: There is abdominal tenderness.  Musculoskeletal:     Cervical back: Normal range of motion.  Skin:    Findings: No rash.  Neurological:     Mental Status: She is alert and oriented to person, place, and time.     BP (!) 150/90   Pulse 73   Temp (!) 97 F (36.1 C) (Temporal)   Ht 5\' 2"  (1.575 m)   Wt 195 lb (88.5 kg)   SpO2 94%   BMI 35.67 kg/m  Wt Readings from Last 3 Encounters:  10/26/20 195 lb (88.5 kg)  09/24/20 189 lb 4 oz (85.8 kg)  09/17/20 201  lb (91.2 kg)    Health Maintenance Due  Topic Date Due  . Zoster Vaccines- Shingrix (1 of 2) Never done  . DEXA SCAN  10/13/2019  . OPHTHALMOLOGY EXAM  11/10/2019  . FOOT EXAM  05/19/2020  . URINE MICROALBUMIN  08/28/2020    There are no preventive care reminders to display for this patient.   Lab Results  Component Value Date    TSH 2.340 08/29/2019   Lab Results  Component Value Date   WBC 13.2 (H) 07/25/2020   HGB 14.4 07/25/2020   HCT 43.3 07/25/2020   MCV 90.2 07/25/2020   PLT 173 07/25/2020   Lab Results  Component Value Date   NA 140 08/09/2020   K 3.6 08/09/2020   CO2 27 08/09/2020   GLUCOSE 134 (H) 08/09/2020   BUN 22 08/09/2020   CREATININE 0.70 08/09/2020   BILITOT 0.9 07/25/2020   ALKPHOS 93 07/25/2020   AST 15 07/25/2020   ALT 15 07/25/2020   PROT 6.8 07/25/2020   ALBUMIN 3.5 07/25/2020   CALCIUM 8.9 08/09/2020   ANIONGAP 10 08/09/2020   GFR 59.25 (L) 08/07/2015   Lab Results  Component Value Date   CHOL 128 08/29/2019   Lab Results  Component Value Date   HDL 39 (L) 08/29/2019   Lab Results  Component Value Date   LDLCALC 62 08/29/2019   Lab Results  Component Value Date   TRIG 160 (H) 08/29/2019   Lab Results  Component Value Date   CHOLHDL 3.3 08/29/2019   Lab Results  Component Value Date   HGBA1C 9.1 (H) 08/09/2020       Assessment & Plan:  Functional diarrhea Increase hydration, broths, avoid caffeine and alcohol, decrease fiber, no dairy, eats crackers, toast, egg, rice or chicken and start probiotic.  Report any blood in stool,.  Order Lomotil started patient on probiotic.   Rx sent to pharmacy. Follow up with worsening or unresolved symptoms.   Problem List Items Addressed This Visit      Digestive   Functional diarrhea - Primary    Increase hydration, broths, avoid caffeine and alcohol, decrease fiber, no dairy, eats crackers, toast, egg, rice or chicken and start probiotic.  Report any blood in stool,.  Order Lomotil started patient on probiotic.   Rx sent to pharmacy. Follow up with worsening or unresolved symptoms.       Relevant Medications   Probiotic, Lactobacillus, CAPS   diphenoxylate-atropine (LOMOTIL) 2.5-0.025 MG tablet       Meds ordered this encounter  Medications  . DISCONTD: Probiotic, Lactobacillus, CAPS    Sig: Take 1  capsule by mouth daily.    Dispense:  30 capsule    Refill:  1    Order Specific Question:   Supervising Provider    Answer:   [name] [[id number]]  . Probiotic, Lactobacillus, CAPS    Sig: Take 1 capsule by mouth daily.    Dispense:  30 capsule    Refill:  1    Order Specific Question:   Supervising Provider    Answer:   [name] [[id number]]  . diphenoxylate-atropine (LOMOTIL) 2.5-0.025 MG tablet    Sig: Take 2 tablets by mouth 4 (four) times daily as needed for diarrhea or loose stools.    Dispense:  30 tablet    Refill:  0    Order Specific Question:   Supervising Provider    Answer:   [name] [[id number]]     Quinn Axe  Christiana Pellant, NP

## 2020-10-26 NOTE — Patient Instructions (Signed)
Increase hydration, broths, avoid caffeine and alcohol, decrease fiber, no dairy, eats crackers, toast, egg, rice or chicken and start probiotic.  Report any blood in stool     Diarrhea, Adult Diarrhea is when you pass loose and watery poop (stool) often. Diarrhea can make you feel weak and cause you to lose water in your body (get dehydrated). Losing water in your body can cause you to:  Feel tired and thirsty.  Have a dry mouth.  Go pee (urinate) less often. Diarrhea often lasts 2-3 days. However, it can last longer if it is a sign of something more serious. It is important to treat your diarrhea as told by your doctor. Follow these instructions at home: Eating and drinking Follow these instructions as told by your doctor:  Take an ORS (oral rehydration solution). This is a drink that helps you replace fluids and minerals your body lost. It is sold at pharmacies and stores.  Drink plenty of fluids, such as: ? Water. ? Ice chips. ? Diluted fruit juice. ? Low-calorie sports drinks. ? Milk, if you want.  Avoid drinking fluids that have a lot of sugar or caffeine in them.  Eat bland, easy-to-digest foods in small amounts as you are able. These foods include: ? Bananas. ? Applesauce. ? Rice. ? Low-fat (lean) meats. ? Toast. ? Crackers.  Avoid alcohol.  Avoid spicy or fatty foods.      Medicines  Take over-the-counter and prescription medicines only as told by your doctor.  If you were prescribed an antibiotic medicine, take it as told by your doctor. Do not stop using the antibiotic even if you start to feel better. General instructions  Wash your hands often using soap and water. If soap and water are not available, use a hand sanitizer. Others in your home should wash their hands as well. Hands should be washed: ? After using the toilet or changing a diaper. ? Before preparing, cooking, or serving food. ? While caring for a sick person. ? While visiting someone in  a hospital.  Drink enough fluid to keep your pee (urine) pale yellow.  Rest at home while you get better.  Watch your condition for any changes.  Take a warm bath to help with any burning or pain from having diarrhea.  Keep all follow-up visits as told by your doctor. This is important.   Contact a doctor if:  You have a fever.  Your diarrhea gets worse.  You have new symptoms.  You cannot keep fluids down.  You feel light-headed or dizzy.  You have a headache.  You have muscle cramps. Get help right away if:  You have chest pain.  You feel very weak or you pass out (faint).  You have bloody or black poop or poop that looks like tar.  You have very bad pain, cramping, or bloating in your belly (abdomen).  You have trouble breathing or you are breathing very quickly.  Your heart is beating very quickly.  Your skin feels cold and clammy.  You feel confused.  You have signs of losing too much water in your body, such as: ? Dark pee, very little pee, or no pee. ? Cracked lips. ? Dry mouth. ? Sunken eyes. ? Sleepiness. ? Weakness. Summary  Diarrhea is when you pass loose and watery poop (stool) often.  Diarrhea can make you feel weak and cause you to lose water in your body (get dehydrated).  Take an ORS (oral rehydration solution). This is a  drink that is sold at pharmacies and stores.  Eat bland, easy-to-digest foods in small amounts as you are able.  Contact a doctor if your condition gets worse. Get help right away if you have signs that you have lost too much water in your body. This information is not intended to replace advice given to you by your health care provider. Make sure you discuss any questions you have with your health care provider. Document Revised: 10/23/2017 Document Reviewed: 10/23/2017 Elsevier Patient Education  2021 [employer].

## 2020-10-26 NOTE — Assessment & Plan Note (Addendum)
Increase hydration, broths, avoid caffeine and alcohol, decrease fiber, no dairy, eats crackers, toast, egg, rice or chicken and start probiotic.  Report any blood in stool,.  Order Lomotil started patient on probiotic.   Rx sent to pharmacy. Follow up with worsening or unresolved symptoms.

## 2020-10-30 ENCOUNTER — Telehealth: Payer: Self-pay | Admitting: Family Medicine

## 2020-10-30 ENCOUNTER — Telehealth: Payer: Self-pay

## 2020-10-30 NOTE — Telephone Encounter (Signed)
I believe patient will need a face-to-face appointment for a referral since she is Medicare.

## 2020-10-30 NOTE — Telephone Encounter (Signed)
What is "low"?

## 2020-10-30 NOTE — Telephone Encounter (Signed)
very [phone]

## 2020-10-30 NOTE — Telephone Encounter (Signed)
The last 2 nights BS running this low. She eats something and it will go back up. On metformin and 45 units of lantus in the am and 45 units in pm. Please advise

## 2020-10-30 NOTE — Telephone Encounter (Signed)
Jobe Gibbon from [facility] to let us know she will be discharging patient from in home physical therapy but would like for her to continue outpatient physical therapy.  Patient has gone to [facility] before and would prefer to go back there again.  Please place referral. Diagnosis sacroilitis and chronic pain syndrome.

## 2020-10-30 NOTE — Telephone Encounter (Signed)
Patient aware and verbalizes understanding. 

## 2020-10-30 NOTE — Telephone Encounter (Signed)
She can decrease to 40 units but needs to call and discuss with endocrinology since they are managing her diabetes.

## 2020-11-01 ENCOUNTER — Telehealth: Payer: Self-pay | Admitting: Family Medicine

## 2020-11-01 DIAGNOSIS — B379 Candidiasis, unspecified: Secondary | ICD-10-CM

## 2020-11-01 MED ORDER — FLUCONAZOLE 150 MG PO TABS: ORAL_TABLET | ORAL | 0 refills | Status: DC

## 2020-11-01 NOTE — Telephone Encounter (Signed)
PATIENT AWARE

## 2020-11-01 NOTE — Telephone Encounter (Signed)
Diflucan sent in

## 2020-11-01 NOTE — Telephone Encounter (Signed)
Amber Stephenson started her on antibiotic on 5/27 - can you send in med for yeast infection?  [facility] [place]

## 2020-11-01 NOTE — Telephone Encounter (Signed)
  Incoming Patient Call  11/01/2020  What symptoms do you have? Yeast infection   How long have you been sick? Since she started antibiotic  Have you been seen for this problem? Yes on 10/26/20  If your provider decides to give you a prescription, which pharmacy would you like for it to be sent to? [facility] [address]   Patient informed that this information will be sent to the clinical staff for review and that they should receive a follow up call.

## 2020-11-05 ENCOUNTER — Telehealth: Payer: Self-pay | Admitting: Nutrition

## 2020-11-05 NOTE — Telephone Encounter (Signed)
Message left on my machine last Thursday saying blood there were a lot of low sugar readings, and she did not know what to do.  I phoned her and she says that the readings have leveled out and that she has had no more lows.  FBS today was 101.  She denied symptoms when they were reading low last week.  She was told that if it happens again, and she is not feeling symptomatic, to test her blood sugar with her meter, and to calibrate the sensor.  Her husband was there,and shown how to do this on his phone.  They had no final questions.

## 2020-11-06 ENCOUNTER — Other Ambulatory Visit: Payer: Self-pay | Admitting: Family Medicine

## 2020-11-06 DIAGNOSIS — E1169 Type 2 diabetes mellitus with other specified complication: Secondary | ICD-10-CM

## 2020-11-08 ENCOUNTER — Telehealth: Payer: Self-pay

## 2020-11-08 DIAGNOSIS — E1165 Type 2 diabetes mellitus with hyperglycemia: Secondary | ICD-10-CM | POA: Diagnosis not present

## 2020-11-08 DIAGNOSIS — M79675 Pain in left toe(s): Secondary | ICD-10-CM | POA: Diagnosis not present

## 2020-11-08 DIAGNOSIS — E1140 Type 2 diabetes mellitus with diabetic neuropathy, unspecified: Secondary | ICD-10-CM | POA: Diagnosis not present

## 2020-11-08 DIAGNOSIS — I5032 Chronic diastolic (congestive) heart failure: Secondary | ICD-10-CM | POA: Diagnosis not present

## 2020-11-08 DIAGNOSIS — G894 Chronic pain syndrome: Secondary | ICD-10-CM | POA: Diagnosis not present

## 2020-11-08 DIAGNOSIS — S92415D Nondisplaced fracture of proximal phalanx of left great toe, subsequent encounter for fracture with routine healing: Secondary | ICD-10-CM | POA: Diagnosis not present

## 2020-11-08 DIAGNOSIS — I110 Hypertensive heart disease with heart failure: Secondary | ICD-10-CM | POA: Diagnosis not present

## 2020-11-08 DIAGNOSIS — M461 Sacroiliitis, not elsewhere classified: Secondary | ICD-10-CM | POA: Diagnosis not present

## 2020-11-08 NOTE — Telephone Encounter (Signed)
Amber Stephenson from [facility] (physical therapist) called to let you know home health has discharged patient as of today.  Patient wishes to continue outpatient physical therapy for her balance issues.  She would like to be referred to [facility].  Their phone number is [phone] and their fax number is [phone].  Patient has an appointment with you on 11/14/20 if you need to discuss this with her then.

## 2020-11-11 ENCOUNTER — Other Ambulatory Visit: Payer: Self-pay | Admitting: Family Medicine

## 2020-11-11 DIAGNOSIS — R262 Difficulty in walking, not elsewhere classified: Secondary | ICD-10-CM

## 2020-11-11 NOTE — Telephone Encounter (Signed)
Referral placed, as requested WS 

## 2020-11-14 ENCOUNTER — Ambulatory Visit (INDEPENDENT_AMBULATORY_CARE_PROVIDER_SITE_OTHER): Payer: Medicare Other | Admitting: Family Medicine

## 2020-11-14 ENCOUNTER — Encounter: Payer: Self-pay | Admitting: Family Medicine

## 2020-11-14 ENCOUNTER — Other Ambulatory Visit: Payer: Self-pay

## 2020-11-14 VITALS — BP 136/89 | HR 81 | Temp 97.7°F | Ht 62.0 in | Wt 192.8 lb

## 2020-11-14 DIAGNOSIS — E782 Mixed hyperlipidemia: Secondary | ICD-10-CM

## 2020-11-14 DIAGNOSIS — IMO0002 Reserved for concepts with insufficient information to code with codable children: Secondary | ICD-10-CM

## 2020-11-14 DIAGNOSIS — E1143 Type 2 diabetes mellitus with diabetic autonomic (poly)neuropathy: Secondary | ICD-10-CM | POA: Diagnosis not present

## 2020-11-14 DIAGNOSIS — G8929 Other chronic pain: Secondary | ICD-10-CM | POA: Diagnosis not present

## 2020-11-14 DIAGNOSIS — M5442 Lumbago with sciatica, left side: Secondary | ICD-10-CM

## 2020-11-14 DIAGNOSIS — E1165 Type 2 diabetes mellitus with hyperglycemia: Secondary | ICD-10-CM | POA: Diagnosis not present

## 2020-11-14 DIAGNOSIS — M5441 Lumbago with sciatica, right side: Secondary | ICD-10-CM

## 2020-11-14 DIAGNOSIS — I2510 Atherosclerotic heart disease of native coronary artery without angina pectoris: Secondary | ICD-10-CM

## 2020-11-14 DIAGNOSIS — G894 Chronic pain syndrome: Secondary | ICD-10-CM | POA: Diagnosis not present

## 2020-11-14 LAB — BAYER DCA HB A1C WAIVED: HB A1C (BAYER DCA - WAIVED): 6.8 % (ref <7.0)

## 2020-11-14 MED ORDER — NEOMYCIN-POLYMYXIN-HC 3.5-10000-1 OT SOLN: 4.0000 [drp] | Freq: Four times a day (QID) | OTIC | 0 refills | Status: DC

## 2020-11-14 MED ORDER — SUMATRIPTAN SUCCINATE 50 MG PO TABS: ORAL_TABLET | ORAL | 0 refills | Status: DC

## 2020-11-14 MED ORDER — IBUPROFEN 800 MG PO TABS: 800.0000 mg | ORAL_TABLET | Freq: Three times a day (TID) | ORAL | 0 refills | Status: DC | PRN

## 2020-11-14 MED ORDER — INSULIN LISPRO (1 UNIT DIAL) 100 UNIT/ML (KWIKPEN): PEN_INJECTOR | SUBCUTANEOUS | 3 refills | Status: DC

## 2020-11-14 NOTE — Progress Notes (Signed)
Subjective:  Patient ID: Amber Stephenson, female    DOB: Oct 01, 1945  Age: 75 y.o. MRN: [id number]  CC: Medical Management of Chronic Issues   HPI Amber Stephenson presents forFollow-up of diabetes. Patient checks blood sugar at home.  Using Dexcom monitor.  She is having some glucose drop to the 50s during the night.  2 occasions recently.  She went ahead and ate as result.  She double checked with her old fingerstick monitor to be sure it did coincide.  She is also having some high readings alarming between meals she is not sure how to treat those.  She has a sliding scale to use at mealtime but she is not sure what to do with it goes up too high at other times.  She has started seeing an endocrinologist for her diabetes.  A change was made and a sliding scale.  Her husband says it is much more precise.  My concern is that more precisely might also be too complicated for her.  However, he says they are doing well with it.  They have a follow-up coming up in 5 days with the endocrinologist.  Endocrinology is also titrating her Rybelsus as well Patient denies symptoms such as polyuria, polydipsia, excessive hunger, nausea No significant hypoglycemic spells noted. Medications reviewed. Pt reports taking them regularly without complication/adverse reaction being reported today.   Patient states her chronic back pain is now well controlled with the Lyrica.  She is pleased with that.  Wants to continue as is  History Nari has a past medical history of Acute on chronic respiratory failure with hypoxia ([place]) (01/22/2019), Atrial fibrillation with RVR (05/19/2017), Bipolar I disorder (01/23/2015), CAD (coronary artery disease), Cardiomegaly (01/12/2018), Chronic anticoagulation (05/30/2015), Chronic atrial fibrillation ([place]) (01/04/2015), Chronic back pain, Chronic diastolic CHF (congestive heart failure) (05/30/2015), Diabetes mellitus, type 2, without complication, Diabetic neuropathy (02/06/2016),  Dysrhythmia, Essential hypertension, Herpes genitalis in women (07/16/2015), Hyperlipidemia, Hypoxia (02/01/2019), Insomnia (01/23/2015), Lipoma (02/08/2015), Major depressive disorder, Migraine headache with aura (02/12/2016), 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[place]) (04/21/2019), NAFL (nonalcoholic fatty liver) (3/64/6803), OSA (obstructive sleep apnea) (02/24/2019), Pulmonary hypertension, Renal insufficiency, and RLS (restless legs syndrome) (04/27/2015).   She has a past surgical history that includes THIGH SURGERY; Shoulder surgery (Right); Breast reduction surgery; Eye surgery (Right); Hammer toe surgery; LEFT HEART CATH AND CORONARY ANGIOGRAPHY (N/A, 02/03/2018); and Reverse shoulder arthroplasty (Right, 08/19/2019).   Her family history includes Alcohol abuse in her sister; Alzheimer's disease in her father; Diabetes in her brother, mother, and sister; Heart disease in her brother, mother, and sister; Mental illness in her brother.She reports that she has never smoked. She has never used smokeless tobacco. She reports previous alcohol use. She reports that she does not use drugs.  Current Outpatient Medications on File Prior to Visit  Medication Sig Dispense Refill   albuterol (PROVENTIL) (2.5 MG/3ML) 0.083% nebulizer solution Take 3 mLs (2.5 mg total) by nebulization every 6 (six) hours as needed for wheezing or shortness of breath. 150 mL 0   albuterol (VENTOLIN HFA) 108 (90 Base) MCG/ACT inhaler Inhale 2 puffs into the lungs every 6 (six) hours as needed for wheezing or shortness of breath. 1 each 0   apixaban (ELIQUIS) 5 MG TABS tablet Take 1 tablet (5 mg total) by mouth 2 (two) times daily. 60 tablet 0   ARIPiprazole (ABILIFY) 10 MG tablet Take 3 tablets (30 mg total) by mouth daily. 90 tablet 0   atorvastatin (LIPITOR) 40 MG tablet TAKE 1 TABLET  DAILY 90 tablet 0   Balsam [place]-Castor Oil (VENELEX) OINT Apply topically. Special Instructions: Apply to coccyx and bilateral buttocks q shift for  prevention/ redness.     carbamazepine (CARBATROL) 100 MG 12 hr capsule Take by mouth at bedtime.     Continuous Blood Gluc Sensor (DEXCOM G6 SENSOR) MISC 1 Device by Does not apply route as directed. 9 each 3   Continuous Blood Gluc Transmit (DEXCOM G6 TRANSMITTER) MISC 1 Device by Does not apply route as directed. 1 each 3   denosumab (PROLIA) 60 MG/ML SOSY injection Inject 60 mg into the skin every 6 (six) months. 1 mL 1   diltiazem (CARDIZEM CD) 120 MG 24 hr capsule Take 1 capsule (120 mg total) by mouth at bedtime. 90 capsule 2   diphenoxylate-atropine (LOMOTIL) 2.5-0.025 MG tablet Take 2 tablets by mouth 4 (four) times daily as needed for diarrhea or loose stools. 30 tablet 0   escitalopram (LEXAPRO) 10 MG tablet Take 1 tablet (10 mg total) by mouth daily. 30 tablet 0   fluconazole (DIFLUCAN) 150 MG tablet Take one tablet now. May repeat in 3 days if symptoms continue 2 tablet 0   Fluticasone-Salmeterol (ADVAIR DISKUS) 500-50 MCG/DOSE AEPB Inhale 1 puff into the lungs in the morning and at bedtime. 60 each 0   furosemide (LASIX) 20 MG tablet Take 0.5 tablets (10 mg total) by mouth daily. TAKE 1 TABLET EVERY MORNING     Galcanezumab-gnlm (EMGALITY) 120 MG/ML SOAJ Administer once a month starting 09/27/2020 1.12 mL 11   HYDROmorphone (DILAUDID) 2 MG tablet Take 1 tablet (2 mg total) by mouth every 6 (six) hours as needed for severe pain. 120 tablet 0   icosapent Ethyl (VASCEPA) 1 g capsule TAKE 2 CAPSULES TWICE A DAY WITH MEALS 120 capsule 0   insulin glargine (LANTUS SOLOSTAR) 100 UNIT/ML Solostar Pen Inject 45 Units into the skin 2 (two) times daily. 30 mL 2   LORazepam (ATIVAN) 1 MG tablet Take 1 tablet (1 mg total) by mouth 2 (two) times daily. And give 2 tablets by mouth at bedtime 90 tablet 0   meclizine (ANTIVERT) 12.5 MG tablet Take 1 tablet (12.5 mg total) by mouth 3 (three) times daily as needed for dizziness. 30 tablet 0   metFORMIN (GLUCOPHAGE) 500 MG tablet Take 2 tablets (1,000 mg  total) by mouth 2 (two) times daily with a meal. 360 tablet 3   metoprolol succinate (TOPROL XL) 25 MG 24 hr tablet Take 1 tablet (25 mg total) by mouth in the morning and at bedtime. 180 tablet 3   metoprolol succinate (TOPROL-XL) 100 MG 24 hr tablet Take 1 tablet (100 mg total) by mouth in the morning and at bedtime. 60 tablet 0   NON FORMULARY Diet: _____ Regular, ___  __ NAS, ___x____Consistent Carbohydrate, _______NPO _____Other     polyethylene glycol (MIRALAX / GLYCOLAX) 17 g packet Take 17 g by mouth daily. 14 each 0   potassium chloride SA (KLOR-CON) 20 MEQ tablet Take 1 tablet (20 mEq total) by mouth daily. 30 tablet 0   pregabalin (LYRICA) 200 MG capsule Take 1 capsule (200 mg total) by mouth 2 (two) times daily. 60 capsule 0   Probiotic, Lactobacillus, CAPS Take 1 capsule by mouth daily. 30 capsule 1   Semaglutide (RYBELSUS) 14 MG TABS Take 14 mg by mouth daily. 90 tablet 1   tiZANidine (ZANAFLEX) 4 MG capsule TAKE 1 CAPSULE BY MOUTH THREE TIMES DAILY AS NEEDED FOR MUSCLE SPASMS 90 capsule 0  Vitamin D, Cholecalciferol, 25 MCG (1000 UT) TABS Take 1 tablet by mouth daily in the afternoon.     Current Facility-Administered Medications on File Prior to Visit  Medication Dose Route Frequency Provider Last Rate Last Admin   bupivacaine-epinephrine (MARCAINE W/ EPI) 0.5% -1:200000 injection    Anesthesia Intra-op Effie Berkshire, MD   12 mL at 01/21/19 0935    ROS Review of Systems  Constitutional: Negative.   HENT: Negative.    Eyes:  Negative for visual disturbance.  Respiratory:  Negative for shortness of breath.   Cardiovascular:  Negative for chest pain.  Gastrointestinal:  Negative for abdominal pain.  Musculoskeletal:  Positive for arthralgias and back pain.   Objective:  BP 136/89   Pulse 81   Temp 97.7 F (36.5 C)   Ht 5' 2"  (1.575 m)   Wt 192 lb 12.8 oz (87.5 kg)   BMI 35.26 kg/m   BP Readings from Last 3 Encounters:  11/14/20 136/89  10/26/20 (!) 150/90   09/24/20 140/70    Wt Readings from Last 3 Encounters:  11/14/20 192 lb 12.8 oz (87.5 kg)  10/26/20 195 lb (88.5 kg)  09/24/20 189 lb 4 oz (85.8 kg)     Physical Exam Constitutional:      General: She is not in acute distress.    Appearance: She is well-developed.  HENT:     Head: Normocephalic and atraumatic.  Eyes:     Conjunctiva/sclera: Conjunctivae normal.     Pupils: Pupils are equal, round, and reactive to light.  Neck:     Thyroid: No thyromegaly.  Cardiovascular:     Rate and Rhythm: Normal rate and regular rhythm.     Heart sounds: Normal heart sounds. No murmur heard. Pulmonary:     Effort: Pulmonary effort is normal. No respiratory distress.     Breath sounds: Normal breath sounds. No wheezing or rales.  Abdominal:     General: Bowel sounds are normal. There is no distension.     Palpations: Abdomen is soft.     Tenderness: There is no abdominal tenderness.  Musculoskeletal:        General: Normal range of motion.     Cervical back: Normal range of motion and neck supple.  Lymphadenopathy:     Cervical: No cervical adenopathy.  Skin:    General: Skin is warm and dry.  Neurological:     Mental Status: She is alert and oriented to person, place, and time.  Psychiatric:        Behavior: Behavior normal.        Thought Content: Thought content normal.        Judgment: Judgment normal.    Assessment & Plan:   Aleah was seen today for medical management of chronic issues.  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 May repeat in 2 hours if headache persists or recurs. Max two per 24  hours -     ibuprofen (ADVIL) 800 MG tablet; Take 1 tablet (800 mg total) by mouth every 8 (eight) hours as  needed. -     insulin lispro (HUMALOG KWIKPEN) 100 UNIT/ML KwikPen; SSI changed by endocrine -     neomycin-polymyxin-hydrocortisone (CORTISPORIN) OTIC solution; Place 4 drops into the right ear 4 (four) times daily.   For now it seems prudent to have the endocrinologist manage her diabetes.  No cooperative the reaction with that plan of care.  I have changed [name]. Bergquist's insulin lispro. I am also having her start on SUMAtriptan, ibuprofen, and neomycin-polymyxin-hydrocortisone. Additionally, I am having her maintain her polyethylene glycol, NON FORMULARY, Vitamin D (Cholecalciferol), Venelex, albuterol, albuterol, ARIPiprazole, apixaban, escitalopram, Fluticasone-Salmeterol, LORazepam, meclizine, metoprolol succinate, potassium chloride SA, pregabalin, icosapent Ethyl, Prolia, carbamazepine, furosemide, metoprolol succinate, diltiazem, Dexcom G6 Transmitter, Dexcom G6 Sensor, metFORMIN, Rybelsus, Lantus SoloStar, Emgality, HYDROmorphone, Probiotic (Lactobacillus), diphenoxylate-atropine, fluconazole, tiZANidine, and atorvastatin.     Follow-up: No follow-ups on file.  [name], M.D.

## 2020-11-15 LAB — CBC WITH DIFFERENTIAL/PLATELET
Basophils Absolute: 0.1 10*3/uL (ref 0.0–0.2)
Basos: 1 %
EOS (ABSOLUTE): 0.4 10*3/uL (ref 0.0–0.4)
Eos: 4 %
Hematocrit: 42.1 % (ref 34.0–46.6)
Hemoglobin: 14.2 g/dL (ref 11.1–15.9)
Immature Grans (Abs): 0.1 10*3/uL (ref 0.0–0.1)
Immature Granulocytes: 1 %
Lymphocytes Absolute: 4 10*3/uL — ABNORMAL HIGH (ref 0.7–3.1)
Lymphs: 36 %
MCH: 29.5 pg (ref 26.6–33.0)
MCHC: 33.7 g/dL (ref 31.5–35.7)
MCV: 87 fL (ref 79–97)
Monocytes Absolute: 0.7 10*3/uL (ref 0.1–0.9)
Monocytes: 6 %
Neutrophils Absolute: 5.8 10*3/uL (ref 1.4–7.0)
Neutrophils: 52 %
Platelets: 226 10*3/uL (ref 150–450)
RBC: 4.82 x10E6/uL (ref 3.77–5.28)
RDW: 14.2 % (ref 11.7–15.4)
WBC: 11.1 10*3/uL — ABNORMAL HIGH (ref 3.4–10.8)

## 2020-11-15 LAB — CMP14+EGFR
ALT: 22 IU/L (ref 0–32)
AST: 26 IU/L (ref 0–40)
Albumin/Globulin Ratio: 1.6 (ref 1.2–2.2)
Albumin: 4.3 g/dL (ref 3.7–4.7)
Alkaline Phosphatase: 136 IU/L — ABNORMAL HIGH (ref 44–121)
BUN/Creatinine Ratio: 15 (ref 12–28)
BUN: 11 mg/dL (ref 8–27)
Bilirubin Total: 0.2 mg/dL (ref 0.0–1.2)
CO2: 22 mmol/L (ref 20–29)
Calcium: 9.4 mg/dL (ref 8.7–10.3)
Chloride: 103 mmol/L (ref 96–106)
Creatinine, Ser: 0.71 mg/dL (ref 0.57–1.00)
Globulin, Total: 2.7 g/dL (ref 1.5–4.5)
Glucose: 125 mg/dL — ABNORMAL HIGH (ref 65–99)
Potassium: 4.5 mmol/L (ref 3.5–5.2)
Sodium: 142 mmol/L (ref 134–144)
Total Protein: 7 g/dL (ref 6.0–8.5)
eGFR: 90 mL/min/{1.73_m2} (ref >59)

## 2020-11-15 LAB — LIPID PANEL
Chol/HDL Ratio: 4.7 ratio — ABNORMAL HIGH (ref 0.0–4.4)
Cholesterol, Total: 219 mg/dL — ABNORMAL HIGH (ref 100–199)
HDL: 47 mg/dL (ref >39)
LDL Chol Calc (NIH): 135 mg/dL — ABNORMAL HIGH (ref 0–99)
Triglycerides: 207 mg/dL — ABNORMAL HIGH (ref 0–149)
VLDL Cholesterol Cal: 37 mg/dL (ref 5–40)

## 2020-11-15 NOTE — Progress Notes (Signed)
Hello Seema,  Your lab result is normal and/or stable.Some minor variations that are not significant are commonly marked abnormal, but do not represent any medical problem for you.  Best regards, Leighton Luster, M.D.

## 2020-11-19 ENCOUNTER — Ambulatory Visit: Payer: Medicare Other | Admitting: Internal Medicine

## 2020-11-21 ENCOUNTER — Other Ambulatory Visit: Payer: Self-pay | Admitting: Neurology

## 2020-11-23 ENCOUNTER — Other Ambulatory Visit (HOSPITAL_COMMUNITY): Payer: Self-pay | Admitting: Family Medicine

## 2020-11-23 DIAGNOSIS — Z1231 Encounter for screening mammogram for malignant neoplasm of breast: Secondary | ICD-10-CM

## 2020-11-28 ENCOUNTER — Ambulatory Visit (HOSPITAL_COMMUNITY): Payer: Medicare Other

## 2020-11-29 DIAGNOSIS — F251 Schizoaffective disorder, depressive type: Secondary | ICD-10-CM | POA: Diagnosis not present

## 2020-11-30 ENCOUNTER — Encounter
Payer: Medicare Other | Attending: Physical Medicine and Rehabilitation | Admitting: Physical Medicine and Rehabilitation

## 2020-11-30 ENCOUNTER — Other Ambulatory Visit: Payer: Self-pay

## 2020-11-30 ENCOUNTER — Encounter: Payer: Self-pay | Admitting: Physical Medicine and Rehabilitation

## 2020-11-30 VITALS — BP 142/86 | Temp 98.9°F | Ht 62.0 in | Wt 198.0 lb

## 2020-11-30 DIAGNOSIS — G894 Chronic pain syndrome: Secondary | ICD-10-CM

## 2020-11-30 DIAGNOSIS — M7918 Myalgia, other site: Secondary | ICD-10-CM | POA: Diagnosis not present

## 2020-11-30 MED ORDER — PREGABALIN 200 MG PO CAPS: 200.0000 mg | ORAL_CAPSULE | Freq: Two times a day (BID) | ORAL | 5 refills | Status: DC

## 2020-11-30 MED ORDER — HYDROMORPHONE HCL 2 MG PO TABS: 2.0000 mg | ORAL_TABLET | Freq: Four times a day (QID) | ORAL | 0 refills | Status: DC | PRN

## 2020-11-30 NOTE — Progress Notes (Signed)
Subjective:    Patient ID: Amber Stephenson, female    DOB: 27-Jan-1946, 75 y.o.   MRN: [id number]  HPI Patient is a 75 yr old female with hx of  CAD, pulmonary HTN- and DM- with CKD- Stage III here for right low back pain with sciatica  As well as upper back/neck pain/myofascial pain. Pt is here for f/u on chronic pain- asking for trigger point injections.    Hasn't been back in nursing home since last saw her.  Having a lot of pain- in R low back. And also in neck-  Started 1 month ago.  Husband will massage and then just comes back.   Uses 31 oil- essential oils to rub neck and helps- lasts only 5-10 minutes. At best.   Neck is more of a problem- than it has been in past - low back is chronic. Neck started abruptly- strange pillows- tried something different.   Overall, walking better and improvement in strength- Was able to walk all of [employer]- not one complaint.  Just the other day. Sometimes held onto the cart, but not the whole time.   Took Lyrica in past- from me- and worked real well- but somehow, I didn't refill wasn't asked- will renew Sometimes pain is so bad, takes 2 pain meds - that's rare   Pain Inventory Average Pain 8 Pain Right Now 9 My pain is burning and aching  In the last 24 hours, has pain interfered with the following? General activity 10 Relation with others 10 Enjoyment of life 10 What TIME of day is your pain at its worst? morning , daytime, evening, and night Sleep (in general) NA  Pain is worse with: bending Pain improves with: rest, heat/ice, therapy/exercise, medication, and TENS Relief from Meds: 7  Family History  Problem Relation Age of Onset   Diabetes Mother    Heart disease Mother    Heart disease Brother    Heart disease Sister        CABG   Diabetes Sister    Alzheimer's disease Father    Alcohol abuse Sister    Mental illness Brother    Diabetes Brother    Social History   Socioeconomic History   Marital status:  Married    Spouse name: Orpah Greek   Number of children: 3   Years of education: 15   Highest education level: Bachelor's degree (e.g., BA, AB, BS)  Occupational History   Occupation: Retired    Comment: marketing  Tobacco Use   Smoking status: Never   Smokeless tobacco: Never  Vaping Use   Vaping Use: Never used  Substance and Sexual Activity   Alcohol use: Not Currently    Alcohol/week: 0.0 standard drinks   Drug use: No   Sexual activity: Yes    Partners: Male    Birth control/protection: Post-menopausal  Other Topics Concern   Not on file  Social History Narrative   Lives at home with husband.       Right handed   Social Determinants of Health   Financial Resource Strain: Not on file  Food Insecurity: Not on file  Transportation Needs: Not on file  Physical Activity: Not on file  Stress: Not on file  Social Connections: Not on file   Past Surgical History:  Procedure Laterality Date   BREAST REDUCTION SURGERY     EYE SURGERY Right    cateracts   HAMMER TOE SURGERY     LEFT HEART CATH AND CORONARY ANGIOGRAPHY N/A  02/03/2018   Procedure: LEFT HEART CATH AND CORONARY ANGIOGRAPHY;  Surgeon: Martinique, Peter M, MD;  Location: [facility];  Service: Cardiovascular;  Laterality: N/A;   REVERSE SHOULDER ARTHROPLASTY Right 08/19/2019   Procedure: REVERSE SHOULDER ARTHROPLASTY;  Surgeon: Netta Cedars, MD;  Location: [facility];  Service: Orthopedics;  Laterality: Right;  interscalene block   SHOULDER SURGERY Right    "I BROKE MY SHOUDLER   THIGH SURGERY     "TO REMOVE A TUMOR "   Past Surgical History:  Procedure Laterality Date   BREAST REDUCTION SURGERY     EYE SURGERY Right    cateracts   HAMMER TOE SURGERY     LEFT HEART CATH AND CORONARY ANGIOGRAPHY N/A 02/03/2018   Procedure: LEFT HEART CATH AND CORONARY ANGIOGRAPHY;  Surgeon: Martinique, Peter M, MD;  Location: [place] [facility];  Service: Cardiovascular;  Laterality: N/A;   REVERSE SHOULDER ARTHROPLASTY Right  08/19/2019   Procedure: REVERSE SHOULDER ARTHROPLASTY;  Surgeon: Netta Cedars, MD;  Location: [facility];  Service: Orthopedics;  Laterality: Right;  interscalene block   SHOULDER SURGERY Right    "I BROKE MY SHOUDLER   THIGH SURGERY     "TO REMOVE A TUMOR "   Past Medical History:  Diagnosis Date   Acute on chronic respiratory failure with hypoxia ([place]) 01/22/2019   Atrial fibrillation with RVR 05/19/2017   Bipolar I disorder 01/23/2015   CAD (coronary artery disease)    Nonobstructive; Managed by Dr. Bronson Ing   Cardiomegaly 01/12/2018   Chronic anticoagulation 05/30/2015   Chronic atrial fibrillation ([place]) 01/04/2015   Chronic back pain    Lower back   Chronic diastolic CHF (congestive heart failure) 05/30/2015   Diabetes mellitus, type 2, without complication    Diabetic neuropathy 02/06/2016   Dysrhythmia    A-Fib   Essential hypertension    Herpes genitalis in women 07/16/2015   Hyperlipidemia    Hypoxia 02/01/2019   Insomnia 01/23/2015   Lipoma 02/08/2015   Major depressive disorder    Migraine headache with aura 02/12/2016   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[place]) 04/21/2019   NAFL (nonalcoholic fatty liver) 7/98/9211   OSA (obstructive sleep apnea) 02/24/2019   10/09/2018 - HST  - AHI 40.6    Pulmonary hypertension    Renal insufficiency    Managed by Dr. Wallace Keller   RLS (restless legs syndrome) 04/27/2015   BP (!) 142/86   Temp 98.9 F (37.2 C)   Ht 5\' 2"  (1.575 m)   Wt 198 lb (89.8 kg)   SpO2 92%   BMI 36.21 kg/m   Opioid Risk Score:   Fall Risk Score:  `1  Depression screen PHQ 2/9  Depression screen [facility] 2/9 11/14/2020 09/17/2020 09/07/2020 09/05/2020 08/14/2020 06/13/2020 05/29/2020  Decreased Interest 0 0 0 0 0 0 0  Down, Depressed, Hopeless 0 0 0 0 0 0 0  PHQ - 2 Score 0 0 0 0 0 0 0  Altered sleeping - 0 - - - 3 -  Tired, decreased energy - 0 - - - 2 -  Change in appetite - 0 - - - 3 -  Feeling bad or failure about yourself  - 0 - - - 0 -  Trouble concentrating -  0 - - - 0 -  Moving slowly or fidgety/restless - 0 - - - 0 -  Suicidal thoughts - 0 - - - 0 -  PHQ-9 Score - 0 - - - 8 -  Difficult doing work/chores - - - - - - -  Some recent data might be hidden    Review of Systems  Constitutional: Negative.   HENT: Negative.    Eyes: Negative.   Respiratory: Negative.    Cardiovascular: Negative.   Gastrointestinal: Negative.   Endocrine: Negative.   Genitourinary: Negative.   Musculoskeletal:  Positive for arthralgias, back pain, gait problem, myalgias, neck pain and neck stiffness.  Skin: Negative.   Allergic/Immunologic: Negative.   Hematological: Negative.   Psychiatric/Behavioral: Negative.    All other systems reviewed and are negative.     Objective:   Physical Exam        Assessment & Plan:   Patient is a 75 yr old female with hx of  CAD, pulmonary HTN- and DM- with CKD- Stage III here for right low back pain with sciatica  As well as upper back/neck pain/myofascial pain. Pt is here for f/u on chronic pain- asking for trigger point injections.    Will refill/renew Lyrica 200 mg -do nightly x 1 week, then go back to 200 mg 2x/day- for nerve pain- gave 5 refills Will renew Dilaudid/Hydromorphone 2 mg q6 hours as needed  Patient here for trigger point injections for myofascial pain  Consent done and on chart.  Cleaned areas with alcohol and injected using a 27 gauge 1.5 inch needle  Injected 6cc Using 1% Lidocaine with no EPI  Upper traps B/L  Levators B/L  Posterior scalenes B/L  Middle scalenes Splenius Capitus Pectoralis Major Rhomboids B/L x2 Infraspinatus Teres Major/minor Thoracic paraspinals Lumbar paraspinals R side x3 Other injections- cervical paraspinals B/L x2  Patient's level of pain prior was 8/10 Current level of pain after injections is 6/10- and more ROM There was no bleeding however felt lightheaded afterwards and stepped/ell back into chair -lightheadedness improved,- BP went up some to  168/97- from high 140s/90s- pt is clear that pain is better, however BP up/even though lightheadedness has improved - pt fine with going straight home- but will call if issues.   Patient was advised to drink a lot of water on day after injections to flush system Will have increased soreness for 12-48 hours after injections.  Can use Lidocaine patches the day AFTER injections Can use theracane on day of injections in places didn't inject Can use heating pad or ice 4-6 hours AFTER injections 28. F/U on 2 months

## 2020-11-30 NOTE — Patient Instructions (Signed)
Patient is a 75 yr old female with hx of  CAD, pulmonary HTN- and DM- with CKD- Stage III here for right low back pain with sciatica  As well as upper back/neck pain/myofascial pain. Pt is here for f/u on chronic pain- asking for trigger point injections.    Will refill/renew Lyrica 200 mg -do nightly x 1 week, then go back to 200 mg 2x/day- for nerve pain- gave 5 refills Will renew Dilaudid/Hydromorphone 2 mg q6 hours as needed  Patient here for trigger point injections for myofascial pain  Consent done and on chart.  Cleaned areas with alcohol and injected using a 27 gauge 1.5 inch needle  Injected 6cc Using 1% Lidocaine with no EPI  Upper traps B/L  Levators B/L  Posterior scalenes B/L  Middle scalenes Splenius Capitus Pectoralis Major Rhomboids B/L x2 Infraspinatus Teres Major/minor Thoracic paraspinals Lumbar paraspinals R side x3 Other injections- cervical paraspinals B/L x2  Patient's level of pain prior was 8/10 Current level of pain after injections is 6/10- and more ROM There was no bleeding or complications.  Patient was advised to drink a lot of water on day after injections to flush system Will have increased soreness for 12-48 hours after injections.  Can use Lidocaine patches the day AFTER injections Can use theracane on day of injections in places didn't inject Can use heating pad or ice 4-6 hours AFTER injections 28. F/U on 2 months

## 2020-12-04 ENCOUNTER — Telehealth: Payer: Self-pay | Admitting: Student

## 2020-12-04 MED ORDER — METOPROLOL SUCCINATE ER 100 MG PO TB24: 100.0000 mg | ORAL_TABLET | Freq: Two times a day (BID) | ORAL | 3 refills | Status: DC

## 2020-12-04 NOTE — Telephone Encounter (Signed)
    1. Which medications need to be refilled? (please list name of each medication and dose if known) METOPROLOL 100 MG   2. Which pharmacy/location (including street and city if local pharmacy) is medication to be sent to? [facility]   3. Do they need a 30 day or 90 day supply?[place]

## 2020-12-04 NOTE — Telephone Encounter (Signed)
Medication [facility]ill [facility]

## 2020-12-04 NOTE — Telephone Encounter (Signed)
   At prior visits, she had been continued on Cardizem CD 120 mg daily along with Toprol-XL 125 mg twice daily for rate control. Will likely need script for Toprol-XL 100mg  tablets and Toprol-XL 25mg  tablets.   Signed, Erma Heritage, PA-C 12/04/2020, 2:07 PM Pager: [phone]

## 2020-12-04 NOTE — Telephone Encounter (Signed)
According to pt chart, pt taking Metop. 100 mg and metop 25 mg., however Metop 25 mg was supposed to be d/c by Ok Edwards, NP, they are still on pt's chart. Please clarify.

## 2020-12-06 DIAGNOSIS — F1120 Opioid dependence, uncomplicated: Secondary | ICD-10-CM

## 2020-12-06 DIAGNOSIS — M25519 Pain in unspecified shoulder: Secondary | ICD-10-CM | POA: Insufficient documentation

## 2020-12-06 DIAGNOSIS — H524 Presbyopia: Secondary | ICD-10-CM | POA: Insufficient documentation

## 2020-12-06 DIAGNOSIS — H18419 Arcus senilis, unspecified eye: Secondary | ICD-10-CM | POA: Insufficient documentation

## 2020-12-06 DIAGNOSIS — I781 Nevus, non-neoplastic: Secondary | ICD-10-CM | POA: Insufficient documentation

## 2020-12-06 DIAGNOSIS — H2510 Age-related nuclear cataract, unspecified eye: Secondary | ICD-10-CM | POA: Insufficient documentation

## 2020-12-06 DIAGNOSIS — F411 Generalized anxiety disorder: Secondary | ICD-10-CM

## 2020-12-06 DIAGNOSIS — N930 Postcoital and contact bleeding: Secondary | ICD-10-CM

## 2020-12-06 DIAGNOSIS — H11159 Pinguecula, unspecified eye: Secondary | ICD-10-CM | POA: Insufficient documentation

## 2020-12-06 DIAGNOSIS — H5200 Hypermetropia, unspecified eye: Secondary | ICD-10-CM

## 2020-12-06 DIAGNOSIS — M8580 Other specified disorders of bone density and structure, unspecified site: Secondary | ICD-10-CM

## 2020-12-06 DIAGNOSIS — F4312 Post-traumatic stress disorder, chronic: Secondary | ICD-10-CM | POA: Insufficient documentation

## 2020-12-06 DIAGNOSIS — D239 Other benign neoplasm of skin, unspecified: Secondary | ICD-10-CM | POA: Insufficient documentation

## 2020-12-06 DIAGNOSIS — M2040 Other hammer toe(s) (acquired), unspecified foot: Secondary | ICD-10-CM | POA: Insufficient documentation

## 2020-12-06 DIAGNOSIS — F419 Anxiety disorder, unspecified: Secondary | ICD-10-CM | POA: Insufficient documentation

## 2020-12-06 DIAGNOSIS — F1320 Sedative, hypnotic or anxiolytic dependence, uncomplicated: Secondary | ICD-10-CM | POA: Insufficient documentation

## 2020-12-06 DIAGNOSIS — F3176 Bipolar disorder, in full remission, most recent episode depressed: Secondary | ICD-10-CM | POA: Insufficient documentation

## 2020-12-06 DIAGNOSIS — R531 Weakness: Secondary | ICD-10-CM | POA: Insufficient documentation

## 2020-12-06 DIAGNOSIS — N3090 Cystitis, unspecified without hematuria: Secondary | ICD-10-CM | POA: Insufficient documentation

## 2020-12-06 DIAGNOSIS — F3110 Bipolar disorder, current episode manic without psychotic features, unspecified: Secondary | ICD-10-CM | POA: Insufficient documentation

## 2020-12-06 DIAGNOSIS — E781 Pure hyperglyceridemia: Secondary | ICD-10-CM

## 2020-12-06 DIAGNOSIS — F29 Unspecified psychosis not due to a substance or known physiological condition: Secondary | ICD-10-CM | POA: Insufficient documentation

## 2020-12-06 DIAGNOSIS — R251 Tremor, unspecified: Secondary | ICD-10-CM | POA: Insufficient documentation

## 2020-12-06 DIAGNOSIS — R5381 Other malaise: Secondary | ICD-10-CM

## 2020-12-06 DIAGNOSIS — N950 Postmenopausal bleeding: Secondary | ICD-10-CM | POA: Insufficient documentation

## 2020-12-06 DIAGNOSIS — Z733 Stress, not elsewhere classified: Secondary | ICD-10-CM | POA: Insufficient documentation

## 2020-12-06 HISTORY — DX: Sedative, hypnotic or anxiolytic dependence, uncomplicated: F13.20

## 2020-12-06 HISTORY — DX: Presbyopia: H52.4

## 2020-12-06 HISTORY — DX: Post-traumatic stress disorder, chronic: F43.12

## 2020-12-06 HISTORY — DX: Age-related nuclear cataract, unspecified eye: H25.10

## 2020-12-06 HISTORY — DX: Generalized anxiety disorder: F41.1

## 2020-12-06 HISTORY — DX: Pure hyperglyceridemia: E78.1

## 2020-12-06 HISTORY — DX: Pinguecula, unspecified eye: H11.159

## 2020-12-06 HISTORY — DX: Other specified disorders of bone density and structure, unspecified site: M85.80

## 2020-12-06 HISTORY — DX: Other hammer toe(s) (acquired), unspecified foot: M20.40

## 2020-12-06 HISTORY — DX: Hypermetropia, unspecified eye: H52.00

## 2020-12-06 HISTORY — DX: Other malaise: R53.81

## 2020-12-06 HISTORY — DX: Postcoital and contact bleeding: N93.0

## 2020-12-06 HISTORY — DX: Pain in unspecified shoulder: M25.519

## 2020-12-06 HISTORY — DX: Nevus, non-neoplastic: I78.1

## 2020-12-06 HISTORY — DX: Cystitis, unspecified without hematuria: N30.90

## 2020-12-06 HISTORY — DX: Opioid dependence, uncomplicated: F11.20

## 2020-12-06 HISTORY — DX: Postmenopausal bleeding: N95.0

## 2020-12-11 ENCOUNTER — Other Ambulatory Visit: Payer: Self-pay | Admitting: Family Medicine

## 2020-12-12 ENCOUNTER — Ambulatory Visit (INDEPENDENT_AMBULATORY_CARE_PROVIDER_SITE_OTHER): Payer: Medicare Other

## 2020-12-12 ENCOUNTER — Other Ambulatory Visit: Payer: Self-pay | Admitting: Family Medicine

## 2020-12-12 VITALS — Ht 62.0 in | Wt 207.0 lb

## 2020-12-12 DIAGNOSIS — L989 Disorder of the skin and subcutaneous tissue, unspecified: Secondary | ICD-10-CM | POA: Diagnosis not present

## 2020-12-12 DIAGNOSIS — Z0000 Encounter for general adult medical examination without abnormal findings: Secondary | ICD-10-CM

## 2020-12-12 DIAGNOSIS — Z0001 Encounter for general adult medical examination with abnormal findings: Secondary | ICD-10-CM | POA: Diagnosis not present

## 2020-12-12 NOTE — Patient Instructions (Signed)
Amber Stephenson , Thank you for taking time to come for your Medicare Wellness Visit. I appreciate your ongoing commitment to your health goals. Please review the following plan we discussed and let me know if I can assist you in the future.   Screening recommendations/referrals: Colonoscopy: Timmothy Sours 03/31/2017 - Repeat in 10 years  Mammogram: Done 11/21/2019 - due for repeat Bone Density: Done 10/12/2017 - Repeat every 2 years - Due for repeat Recommended yearly ophthalmology/optometry visit for glaucoma screening and checkup Recommended yearly dental visit for hygiene and checkup  Vaccinations: Influenza vaccine: Done 02/29/2020 - Repeat annually  Pneumococcal vaccine: Done 05/30/2015 & 11/12/2017 Tdap vaccine: Done 07/17/2011 - Repeat in 10 years  Shingles vaccine: Due. Shingrix discussed. Please contact your pharmacy for coverage information.     Covid-19: Done 07/18/19, 08/15/19, 05/02/20  Advanced directives: Please bring a copy of your health care power of attorney and living will to the office to be added to your chart at your convenience.   Conditions/risks identified: Aim for 30 minutes of exercise or brisk walking each day, drink 6-8 glasses of water and eat lots of fruits and vegetables.   Next appointment: Follow up in one year for your annual wellness visit    Preventive Care 65 Years and Older, Female Preventive care refers to lifestyle choices and visits with your health care provider that can promote health and wellness. What does preventive care include? A yearly physical exam. This is also called an annual well check. Dental exams once or twice a year. Routine eye exams. Ask your health care provider how often you should have your eyes checked. Personal lifestyle choices, including: Daily care of your teeth and gums. Regular physical activity. Eating a healthy diet. Avoiding tobacco and drug use. Limiting alcohol use. Practicing safe sex. Taking low-dose aspirin every  day. Taking vitamin and mineral supplements as recommended by your health care provider. What happens during an annual well check? The services and screenings done by your health care provider during your annual well check will depend on your age, overall health, lifestyle risk factors, and family history of disease. Counseling  Your health care provider may ask you questions about your: Alcohol use. Tobacco use. Drug use. Emotional well-being. Home and relationship well-being. Sexual activity. Eating habits. History of falls. Memory and ability to understand (cognition). Work and work Statistician. Reproductive health. Screening  You may have the following tests or measurements: Height, weight, and BMI. Blood pressure. Lipid and cholesterol levels. These may be checked every 5 years, or more frequently if you are over 52 years old. Skin check. Lung cancer screening. You may have this screening every year starting at age 66 if you have a 30-pack-year history of smoking and currently smoke or have quit within the past 15 years. Fecal occult blood test (FOBT) of the stool. You may have this test every year starting at age 78. Flexible sigmoidoscopy or colonoscopy. You may have a sigmoidoscopy every 5 years or a colonoscopy every 10 years starting at age 44. Hepatitis C blood test. Hepatitis B blood test. Sexually transmitted disease (STD) testing. Diabetes screening. This is done by checking your blood sugar (glucose) after you have not eaten for a while (fasting). You may have this done every 1-3 years. Bone density scan. This is done to screen for osteoporosis. You may have this done starting at age 37. Mammogram. This may be done every 1-2 years. Talk to your health care provider about how often you should have regular  mammograms. Talk with your health care provider about your test results, treatment options, and if necessary, the need for more tests. Vaccines  Your health care  provider may recommend certain vaccines, such as: Influenza vaccine. This is recommended every year. Tetanus, diphtheria, and acellular pertussis (Tdap, Td) vaccine. You may need a Td booster every 10 years. Zoster vaccine. You may need this after age 53. Pneumococcal 13-valent conjugate (PCV13) vaccine. One dose is recommended after age 47. Pneumococcal polysaccharide (PPSV23) vaccine. One dose is recommended after age 75. Talk to your health care provider about which screenings and vaccines you need and how often you need them. This information is not intended to replace advice given to you by your health care provider. Make sure you discuss any questions you have with your health care provider. Document Released: 06/15/2015 Document Revised: 02/06/2016 Document Reviewed: 03/20/2015 Elsevier Interactive Patient Education  2017 [place] Prevention in the Home Falls can cause injuries. They can happen to people of all ages. There are many things you can do to make your home safe and to help prevent falls. What can I do on the outside of my home? Regularly fix the edges of walkways and driveways and fix any cracks. Remove anything that might make you trip as you walk through a door, such as a raised step or threshold. Trim any bushes or trees on the path to your home. Use bright outdoor lighting. Clear any walking paths of anything that might make someone trip, such as rocks or tools. Regularly check to see if handrails are loose or broken. Make sure that both sides of any steps have handrails. Any raised decks and porches should have guardrails on the edges. Have any leaves, snow, or ice cleared regularly. Use sand or salt on walking paths during winter. Clean up any spills in your garage right away. This includes oil or grease spills. What can I do in the bathroom? Use night lights. Install grab bars by the toilet and in the tub and shower. Do not use towel bars as grab  bars. Use non-skid mats or decals in the tub or shower. If you need to sit down in the shower, use a plastic, non-slip stool. Keep the floor dry. Clean up any water that spills on the floor as soon as it happens. Remove soap buildup in the tub or shower regularly. Attach bath mats securely with double-sided non-slip rug tape. Do not have throw rugs and other things on the floor that can make you trip. What can I do in the bedroom? Use night lights. Make sure that you have a light by your bed that is easy to reach. Do not use any sheets or blankets that are too big for your bed. They should not hang down onto the floor. Have a firm chair that has side arms. You can use this for support while you get dressed. Do not have throw rugs and other things on the floor that can make you trip. What can I do in the kitchen? Clean up any spills right away. Avoid walking on wet floors. Keep items that you use a lot in easy-to-reach places. If you need to reach something above you, use a strong step stool that has a grab bar. Keep electrical cords out of the way. Do not use floor polish or wax that makes floors slippery. If you must use wax, use non-skid floor wax. Do not have throw rugs and other things on the floor that can  make you trip. What can I do with my stairs? Do not leave any items on the stairs. Make sure that there are handrails on both sides of the stairs and use them. Fix handrails that are broken or loose. Make sure that handrails are as long as the stairways. Check any carpeting to make sure that it is firmly attached to the stairs. Fix any carpet that is loose or worn. Avoid having throw rugs at the top or bottom of the stairs. If you do have throw rugs, attach them to the floor with carpet tape. Make sure that you have a light switch at the top of the stairs and the bottom of the stairs. If you do not have them, ask someone to add them for you. What else can I do to help prevent  falls? Wear shoes that: Do not have high heels. Have rubber bottoms. Are comfortable and fit you well. Are closed at the toe. Do not wear sandals. If you use a stepladder: Make sure that it is fully opened. Do not climb a closed stepladder. Make sure that both sides of the stepladder are locked into place. Ask someone to hold it for you, if possible. Clearly mark and make sure that you can see: Any grab bars or handrails. First and last steps. Where the edge of each step is. Use tools that help you move around (mobility aids) if they are needed. These include: Canes. Walkers. Scooters. Crutches. Turn on the lights when you go into a dark area. Replace any light bulbs as soon as they burn out. Set up your furniture so you have a clear path. Avoid moving your furniture around. If any of your floors are uneven, fix them. If there are any pets around you, be aware of where they are. Review your medicines with your doctor. Some medicines can make you feel dizzy. This can increase your chance of falling. Ask your doctor what other things that you can do to help prevent falls. This information is not intended to replace advice given to you by your health care provider. Make sure you discuss any questions you have with your health care provider. Document Released: 03/15/2009 Document Revised: 10/25/2015 Document Reviewed: 06/23/2014 Elsevier Interactive Patient Education  2017 [employer].

## 2020-12-12 NOTE — Progress Notes (Signed)
Subjective:   Amber Stephenson is a 75 y.o. female who presents for Medicare Annual (Subsequent) preventive examination.  Virtual Visit via Telephone Note  I connected with  Amber Stephenson on 12/12/20 at  1:15 PM EDT by telephone and verified that I am speaking with the correct person using two identifiers.  Location: Patient: Home Provider: [facility] Persons participating in the virtual visit: patient/Nurse Health Advisor   I discussed the limitations, risks, security and privacy concerns of performing an evaluation and management service by telephone and the availability of in person appointments. The patient expressed understanding and agreed to proceed.  Interactive audio and video telecommunications were attempted between this nurse and patient, however failed, due to patient having technical difficulties OR patient did not have access to video capability.  We continued and completed visit with audio only.  Some vital signs may be absent or patient reported.   Dayani Winbush E Wright Gravely, LPN   Review of Systems     Cardiac Risk Factors include: advanced age (>61men, >78 women);obesity (BMI >30kg/m2);sedentary lifestyle;diabetes mellitus;dyslipidemia;hypertension     Objective:    Today's Vitals   12/12/20 1320 12/12/20 1328  Weight: 207 lb (93.9 kg)   Height: 5\' 2"  (1.575 m)   PainSc:  7    Body mass index is 37.86 kg/m.  Advanced Directives 12/12/2020 08/27/2020 08/09/2020 08/01/2020 05/10/2020 05/09/2020 05/07/2020  Does Patient Have a Medical Advance Directive? No No No No Yes Yes Yes  Does patient want to make changes to medical advance directive? - - - - No - Patient declined No - Patient declined No - Patient declined  Would patient like information on creating a medical advance directive? Yes (MAU/Ambulatory/Procedural Areas - Information given) - No - Patient declined No - Patient declined - - -  Some encounter information is confidential and restricted. Go to Review Flowsheets  activity to see all data.    Current Medications (verified) Outpatient Encounter Medications as of 12/12/2020  Medication Sig   albuterol (PROVENTIL) (2.5 MG/3ML) 0.083% nebulizer solution Take 3 mLs (2.5 mg total) by nebulization every 6 (six) hours as needed for wheezing or shortness of breath.   albuterol (VENTOLIN HFA) 108 (90 Base) MCG/ACT inhaler Inhale 2 puffs into the lungs every 6 (six) hours as needed for wheezing or shortness of breath.   apixaban (ELIQUIS) 5 MG TABS tablet Take 1 tablet (5 mg total) by mouth 2 (two) times daily.   ARIPiprazole (ABILIFY) 10 MG tablet Take 3 tablets (30 mg total) by mouth daily.   atorvastatin (LIPITOR) 40 MG tablet TAKE 1 TABLET DAILY   Balsam [place]-Castor Oil (VENELEX) OINT Apply topically. Special Instructions: Apply to coccyx and bilateral buttocks q shift for prevention/ redness.   carbamazepine (CARBATROL) 100 MG 12 hr capsule Take by mouth at bedtime.   Continuous Blood Gluc Sensor (DEXCOM G6 SENSOR) MISC 1 Device by Does not apply route as directed.   Continuous Blood Gluc Transmit (DEXCOM G6 TRANSMITTER) MISC 1 Device by Does not apply route as directed.   denosumab (PROLIA) 60 MG/ML SOSY injection Inject 60 mg into the skin every 6 (six) months.   diltiazem (CARDIZEM CD) 120 MG 24 hr capsule Take 1 capsule (120 mg total) by mouth at bedtime.   escitalopram (LEXAPRO) 10 MG tablet Take 1 tablet (10 mg total) by mouth daily.   Fluticasone-Salmeterol (ADVAIR DISKUS) 500-50 MCG/DOSE AEPB Inhale 1 puff into the lungs in the morning and at bedtime.   furosemide (LASIX) 20 MG tablet Take  0.5 tablets (10 mg total) by mouth daily. TAKE 1 TABLET EVERY MORNING   Galcanezumab-gnlm (EMGALITY) 120 MG/ML SOAJ Administer once a month starting 09/27/2020   HYDROmorphone (DILAUDID) 2 MG tablet Take 1 tablet (2 mg total) by mouth every 6 (six) hours as needed for severe pain.   ibuprofen (ADVIL) 800 MG tablet Take 1 tablet (800 mg total) by mouth every 8 (eight)  hours as needed.   icosapent Ethyl (VASCEPA) 1 g capsule TAKE 2 CAPSULES TWICE A DAY WITH MEALS   insulin glargine (LANTUS SOLOSTAR) 100 UNIT/ML Solostar Pen Inject 45 Units into the skin 2 (two) times daily.   LORazepam (ATIVAN) 1 MG tablet Take 1 tablet (1 mg total) by mouth 2 (two) times daily. And give 2 tablets by mouth at bedtime   meclizine (ANTIVERT) 12.5 MG tablet Take 1 tablet (12.5 mg total) by mouth 3 (three) times daily as needed for dizziness.   metFORMIN (GLUCOPHAGE) 500 MG tablet Take 2 tablets (1,000 mg total) by mouth 2 (two) times daily with a meal.   metoprolol succinate (TOPROL XL) 25 MG 24 hr tablet Take 1 tablet (25 mg total) by mouth in the morning and at bedtime.   metoprolol succinate (TOPROL-XL) 100 MG 24 hr tablet Take 1 tablet (100 mg total) by mouth in the morning and at bedtime.   neomycin-polymyxin-hydrocortisone (CORTISPORIN) OTIC solution Place 4 drops into the right ear 4 (four) times daily.   NON FORMULARY Diet: _____ Regular, ___  __ NAS, ___x____Consistent Carbohydrate, _______NPO _____Other   polyethylene glycol (MIRALAX / GLYCOLAX) 17 g packet Take 17 g by mouth daily.   potassium chloride SA (KLOR-CON) 20 MEQ tablet Take 1 tablet (20 mEq total) by mouth daily.   pregabalin (LYRICA) 200 MG capsule Take 1 capsule (200 mg total) by mouth 2 (two) times daily.   Semaglutide (RYBELSUS) 14 MG TABS Take 14 mg by mouth daily.   SUMAtriptan (IMITREX) 50 MG tablet Take at onset of headache. May repeat in 2 hours if headache persists or recurs. Max two per 24 hours   tiZANidine (ZANAFLEX) 4 MG capsule TAKE 1 CAPSULE BY MOUTH THREE TIMES DAILY AS NEEDED FOR MUSCLE SPASMS   Vitamin D, Cholecalciferol, 25 MCG (1000 UT) TABS Take 1 tablet by mouth daily in the afternoon.   diphenoxylate-atropine (LOMOTIL) 2.5-0.025 MG tablet Take 2 tablets by mouth 4 (four) times daily as needed for diarrhea or loose stools. (Patient not taking: Reported on 12/12/2020)   fluconazole  (DIFLUCAN) 150 MG tablet Take one tablet now. May repeat in 3 days if symptoms continue (Patient not taking: Reported on 12/12/2020)   insulin lispro (HUMALOG KWIKPEN) 100 UNIT/ML KwikPen SSI changed by endocrine (Patient not taking: Reported on 12/12/2020)   Probiotic, Lactobacillus, CAPS Take 1 capsule by mouth daily. (Patient not taking: Reported on 12/12/2020)   temazepam (RESTORIL) 30 MG capsule Take 30 mg by mouth at bedtime as needed. (Patient not taking: Reported on 12/12/2020)   Facility-Administered Encounter Medications as of 12/12/2020  Medication   bupivacaine-epinephrine (MARCAINE W/ EPI) 0.5% -1:200000 injection    Allergies (verified) Ivp dye [iodinated diagnostic agents], Codeine, Iodine, and Latex   History: Past Medical History:  Diagnosis Date   Acute on chronic respiratory failure with hypoxia ([place]) 01/22/2019   Atrial fibrillation with RVR 05/19/2017   Bipolar I disorder 01/23/2015   CAD (coronary artery disease)    Nonobstructive; Managed by Dr. 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 Wallace Keller   RLS (restless legs syndrome) 04/27/2015   Past Surgical History:  Procedure Laterality Date   BREAST REDUCTION SURGERY     EYE SURGERY Right     cateracts   HAMMER TOE SURGERY     LEFT HEART CATH AND CORONARY ANGIOGRAPHY N/A 02/03/2018   Procedure: LEFT HEART CATH AND CORONARY ANGIOGRAPHY;  Surgeon: Martinique, Peter M, MD;  Location: [facility];  Service: Cardiovascular;  Laterality: N/A;   REVERSE SHOULDER ARTHROPLASTY Right 08/19/2019   Procedure: REVERSE SHOULDER ARTHROPLASTY;  Surgeon: Netta Cedars, MD;  Location: [facility];  Service: Orthopedics;  Laterality: Right;  interscalene block   SHOULDER SURGERY Right    "I BROKE MY SHOUDLER   THIGH SURGERY     "TO REMOVE A TUMOR "   Family History  Problem Relation Age of Onset   Diabetes Mother    Heart disease Mother    Heart disease Brother    Heart disease Sister        CABG   Diabetes Sister    Alzheimer's disease Father    Alcohol abuse Sister    Mental illness Brother    Diabetes Brother    Social History   Socioeconomic History   Marital status: Married    Spouse name: Orpah Greek   Number of children: 3   Years of education: 15   Highest education level: Bachelor's degree (e.g., BA, AB, BS)  Occupational History   Occupation: Retired    Comment: marketing  Tobacco Use   Smoking status: Never   Smokeless tobacco: Never  Vaping Use   Vaping Use: Never used  Substance and Sexual Activity   Alcohol use: Not Currently    Alcohol/week: 0.0 standard drinks   Drug use: No   Sexual activity: Yes    Partners: Male    Birth control/protection: Post-menopausal  Other Topics Concern   Not on file  Social History Narrative   Lives at home with husband.    They have 3 children who live away - [place], [place], and [place].   She is from [place] and most of her family lives there.   Her husbands's family live nearby   Right handed.   Social Determinants of Health   Financial Resource Strain: Low Risk    Difficulty of Paying Living Expenses: Not hard at all  Food Insecurity: No Food Insecurity   Worried About Charity fundraiser in the Last Year: Never true    [place] in the Last Year: Never true  Transportation Needs: No Transportation Needs   Lack of Transportation (Medical): No   Lack of Transportation (Non-Medical): No  Physical Activity: Inactive   Days of Exercise per Week: 0 days   Minutes  of Exercise per Session: 0 min  Stress: Stress Concern Present   Feeling of Stress : To some extent  Social Connections: Moderately Integrated   Frequency of Communication with Friends and Family: More than three times a week   Frequency of Social Gatherings with Friends and Family: Once a week   Attends Religious Services: More than 4 times per year   Active Member of [employer] or Organizations: No   Attends Music therapist: Never   Marital Status: Married    Tobacco Counseling Counseling given: Not Answered   Clinical Intake:  Pre-visit preparation completed: Yes  Pain : 0-10 Pain Score: 7  Pain Type: Chronic pain Pain Location: Leg Pain Orientation: Left Pain Descriptors / Indicators: Aching, Discomfort Pain Onset: More than a month ago Pain Frequency: Constant     BMI - recorded: 37.86 Nutritional Status: BMI > 30  Obese Nutritional Risks: None Diabetes: Yes CBG done?: No Did pt. bring in CBG monitor from home?: No  How often do you need to have someone help you when you read instructions, pamphlets, or other written materials from your doctor or pharmacy?: 1 - Never  Nutrition Risk Assessment:  Has the patient had any N/V/D within the last 2 months?  No  Does the patient have any non-healing wounds?  No  Has the patient had any unintentional weight loss or weight gain?  Yes   Diabetes:  Is the patient diabetic?  Yes  If diabetic, was a CBG obtained today?  No  Did the patient bring in their glucometer from home?  No  How often do you monitor your CBG's? CGM - Dexcom.   Financial Strains and Diabetes Management:  Are you having any financial strains with the device, your supplies or your  medication? No .  Does the patient want to be seen by Chronic Care Management for management of their diabetes?  No  Would the patient like to be referred to a Nutritionist or for Diabetic Management?  No   Diabetic Exams:  Diabetic Eye Exam: Completed 2022.  Diabetic Foot Exam: Completed 05/20/2019. Pt has been advised about the importance in completing this exam. Pt is scheduled for diabetic foot exam on 02/13/21.    Interpreter Needed?: No  Information entered by :: Tymber Stallings, LPN   Activities of Daily Living In your present state of health, do you have any difficulty performing the following activities: 12/12/2020 04/23/2020  Hearing? N N  Vision? N N  Difficulty concentrating or making decisions? Y N  Walking or climbing stairs? Y N  Dressing or bathing? N N  Doing errands, shopping? Y N  Preparing Food and eating ? N -  Using the Toilet? N -  In the past six months, have you accidently leaked urine? Y -  Do you have problems with loss of bowel control? N -  Managing your Medications? N -  Managing your Finances? N -  Housekeeping or managing your Housekeeping? N -  Some recent data might be hidden    Patient Care Team: [name], MD as PCP - General (Family Medicine) Herminio Commons, MD (Inactive) as PCP - Cardiology (Cardiology) Weber Cooks, MD as Referring Physician (Orthopedic Surgery) Norma Fredrickson, MD as Consulting Physician (Psychiatry) Susette Racer, MD (Endocrinology) Cameron Sprang, MD as Consulting Physician (Neurology)  Indicate any recent Medical Services you may have received from other than [facility] providers in the past year (date may be approximate).     Assessment:  This is a routine wellness examination for Amber Stephenson.  Hearing/Vision screen Hearing Screening - Comments:: Denies hearing difficulties  Vision Screening - Comments:: Wears eyeglasses - up to date with annual eye exam with Dr Prudencio Burly  Dietary issues and exercise activities  discussed: Current Exercise Habits: The patient does not participate in regular exercise at present, Exercise limited by: orthopedic condition(s);respiratory conditions(s)   Goals Addressed             This Visit's Progress    Exercise 3x per week (30 min per time)   Not on track    Have 3 meals a day   On track    Patient Stated   On track    11/30/2019 AWV Goal: Keep All Scheduled Appointments  Over the next year, patient will attend all scheduled appointments with their PCP and any specialists that they see.         Depression Screen PHQ 2/9 Scores 12/12/2020 11/14/2020 09/17/2020 09/07/2020 09/05/2020 08/14/2020 06/13/2020  PHQ - 2 Score 0 0 0 0 0 0 0  PHQ- 9 Score - - 0 - - - 8  Exception Documentation - - - - - - -    Fall Risk Fall Risk  12/12/2020 11/30/2020 11/14/2020 10/26/2020 09/17/2020  Falls in the past year? 1 1 1 1 1   Comment - - - - -  Number falls in past yr: 1 1 1 1 1   Comment - - - - -  Injury with Fall? 1 0 1 1 1   Comment - - - - -  Risk for fall due to : Impaired balance/gait;Impaired vision;Medication side effect;History of fall(s);Orthopedic patient - Impaired balance/gait Impaired balance/gait History of fall(s);Impaired balance/gait  Follow up Falls prevention discussed;Education provided - Falls evaluation completed Falls prevention discussed Falls prevention discussed    FALL RISK PREVENTION PERTAINING TO THE HOME:  Any stairs in or around the home? Yes  If so, are there any without handrails? No  Home free of loose throw rugs in walkways, pet beds, electrical cords, etc? Yes  Adequate lighting in your home to reduce risk of falls? Yes   ASSISTIVE DEVICES UTILIZED TO PREVENT FALLS:  Life alert? No  Use of a cane, walker or w/c? No  Grab bars in the bathroom? Yes  Shower chair or bench in shower? Yes  Elevated toilet seat or a handicapped toilet? Yes   TIMED UP AND GO:  Was the test performed? No . Telephonic visit  Cognitive Function: MMSE -  Mini Mental State Exam 08/28/2020 01/18/2018 09/22/2017 04/29/2017  Orientation to time 4 5 5 5   Orientation to Place 5 5 5 4   Registration 3 3 3 3   Attention/ Calculation 1 3 5 5   Recall 3 3 3 3   Language- name 2 objects 2 2 2 2   Language- repeat 1 1 1 1   Language- follow 3 step command 3 3 3 3   Language- read & follow direction 1 1 1 1   Write a sentence 1 1 1 1   Copy design 1 1 1 1   Total score 25 28 30 29    Montreal Cognitive Assessment  01/26/2019 08/06/2018  Visuospatial/ Executive (0/5) 2 4  Naming (0/3) 3 3  Attention: Read list of digits (0/2) 1 2  Attention: Read list of letters (0/1) 0 1  Attention: Serial 7 subtraction starting at 100 (0/3) 0 1  Language: Repeat phrase (0/2) 1 1  Language : Fluency (0/1) 0 0  Abstraction (0/2) 1 1  Delayed Recall (  0/5) 0 4  Orientation (0/6) 3 6  Total 11 23   6CIT Screen 12/12/2020 11/30/2019 11/29/2018  What Year? 0 points 0 points 0 points  What month? 0 points 0 points 0 points  What time? 0 points 0 points 0 points  Count back from 20 0 points 4 points 4 points  Months in reverse 2 points 4 points 4 points  Repeat phrase 0 points 4 points 2 points  Total Score 2 12 10     Immunizations Immunization History  Administered Date(s) Administered   Fluad Quad(high Dose 65+) 03/04/2019   Influenza, High Dose Seasonal PF 03/19/2017, 02/17/2018   Influenza,inj,Quad PF,6+ Mos 03/30/2015, 03/17/2016, 02/29/2020   Moderna Sars-Covid-2 Vaccination 07/18/2019, 08/15/2019, 05/02/2020   Pneumococcal Conjugate-13 11/12/2017   Pneumococcal Polysaccharide-23 05/30/2015   Tdap 07/17/2011   Zoster, Live 07/17/2011, 06/02/2012    TDAP status: Up to date  Flu Vaccine status: Up to date  Pneumococcal vaccine status: Up to date  Covid-19 vaccine status: Completed vaccines  Qualifies for Shingles Vaccine? Yes   Zostavax completed Yes   Shingrix Completed?: No.    Education has been provided regarding the importance of this vaccine. Patient has  been advised to call insurance company to determine out of pocket expense if they have not yet received this vaccine. Advised may also receive vaccine at local pharmacy or Health Dept. Verbalized acceptance and understanding.  Screening Tests Health Maintenance  Topic Date Due   Zoster Vaccines- Shingrix (1 of 2) Never done   DEXA SCAN  10/13/2019   OPHTHALMOLOGY EXAM  11/10/2019   FOOT EXAM  05/19/2020   COVID-19 Vaccine (4 - Booster for Moderna series) 07/31/2020   URINE MICROALBUMIN  08/28/2020   INFLUENZA VACCINE  12/31/2020   HEMOGLOBIN A1C  05/16/2021   TETANUS/TDAP  07/16/2021   MAMMOGRAM  11/20/2021   COLONOSCOPY (Pts 45-18yrs Insurance coverage will need to be confirmed)  04/01/2027   Hepatitis C Screening  Completed   PNA vac Low Risk Adult  Completed   HPV VACCINES  Aged Out    Health Maintenance  Health Maintenance Due  Topic Date Due   Zoster Vaccines- Shingrix (1 of 2) Never done   DEXA SCAN  10/13/2019   OPHTHALMOLOGY EXAM  11/10/2019   FOOT EXAM  05/19/2020   COVID-19 Vaccine (4 - Booster for Moderna series) 07/31/2020   URINE MICROALBUMIN  08/28/2020    Colorectal cancer screening: Type of screening: Colonoscopy. Completed 03/31/2017. Repeat every 10 years  Mammogram status: Ordered 10/2020. Pt provided with contact info and advised to call to schedule appt.  She had car trouble and had to cancel appt - will make a new appt  Bone Density status: Completed 10/12/2017. Results reflect: Bone density results: OSTEOPOROSIS. Repeat every 2 years.  Lung Cancer Screening: (Low Dose CT Chest recommended if Age 82-80 years, 30 pack-year currently smoking OR have quit w/in 15years.) does not qualify.   Additional Screening:  Hepatitis C Screening: does qualify; Completed 03/30/2015  Vision Screening: Recommended annual ophthalmology exams for early detection of glaucoma and other disorders of the eye. Is the patient up to date with their annual eye exam?  Yes  Who  is the provider or what is the name of the office in which the patient attends annual eye exams? [facility] If pt is not established with a provider, would they like to be referred to a provider to establish care? No .   Dental Screening: Recommended annual dental exams for proper oral hygiene  Community Resource Referral / Chronic Care Management: CRR required this visit?  No   CCM required this visit?  No      Plan:     I have personally reviewed and noted the following in the patient's chart:   Medical and social history Use of alcohol, tobacco or illicit drugs  Current medications and supplements including opioid prescriptions.  Functional ability and status Nutritional status Physical activity Advanced directives List of other physicians Hospitalizations, surgeries, and ER visits in previous 12 months Vitals Screenings to include cognitive, depression, and falls Referrals and appointments  In addition, I have reviewed and discussed with patient certain preventive protocols, quality metrics, and best practice recommendations. A written personalized care plan for preventive services as well as general preventive health recommendations were provided to patient.     Sandrea Hammond, LPN   7/98/9211   Nurse Notes: She is having some left leg swelling for the past week, aching, especially in ankle - she only takes her Lasix every other day because it makes her bladder uncomfortable - I advised her to take this daily, limit salt and elevate leg - if worsening or no improvement, call for appt.

## 2020-12-12 NOTE — Addendum Note (Signed)
Addended by: [name] on: 12/12/2020 05:22 PM   Modules accepted: Orders

## 2020-12-13 ENCOUNTER — Encounter (HOSPITAL_COMMUNITY): Payer: Self-pay

## 2020-12-13 ENCOUNTER — Emergency Department (HOSPITAL_COMMUNITY): Payer: Medicare Other

## 2020-12-13 ENCOUNTER — Encounter: Payer: Self-pay | Admitting: Family Medicine

## 2020-12-13 ENCOUNTER — Ambulatory Visit (INDEPENDENT_AMBULATORY_CARE_PROVIDER_SITE_OTHER): Payer: Medicare Other | Admitting: Family Medicine

## 2020-12-13 ENCOUNTER — Inpatient Hospital Stay (HOSPITAL_COMMUNITY)
Admission: EM | Admit: 2020-12-13 | Discharge: 2020-12-16 | DRG: 291 | Disposition: A | Discharge status: Home / Self Care | Payer: Medicare Other | Attending: Family Medicine | Admitting: Family Medicine

## 2020-12-13 ENCOUNTER — Other Ambulatory Visit: Payer: Self-pay

## 2020-12-13 DIAGNOSIS — F3112 Bipolar disorder, current episode manic without psychotic features, moderate: Secondary | ICD-10-CM | POA: Diagnosis not present

## 2020-12-13 DIAGNOSIS — R778 Other specified abnormalities of plasma proteins: Secondary | ICD-10-CM

## 2020-12-13 DIAGNOSIS — Z6836 Body mass index (BMI) 36.0-36.9, adult: Secondary | ICD-10-CM

## 2020-12-13 DIAGNOSIS — Z885 Allergy status to narcotic agent status: Secondary | ICD-10-CM

## 2020-12-13 DIAGNOSIS — R0609 Other forms of dyspnea: Secondary | ICD-10-CM | POA: Diagnosis not present

## 2020-12-13 DIAGNOSIS — G4700 Insomnia, unspecified: Secondary | ICD-10-CM | POA: Diagnosis present

## 2020-12-13 DIAGNOSIS — I5033 Acute on chronic diastolic (congestive) heart failure: Secondary | ICD-10-CM | POA: Diagnosis present

## 2020-12-13 DIAGNOSIS — E1111 Type 2 diabetes mellitus with ketoacidosis with coma: Secondary | ICD-10-CM | POA: Diagnosis not present

## 2020-12-13 DIAGNOSIS — I4891 Unspecified atrial fibrillation: Secondary | ICD-10-CM | POA: Diagnosis present

## 2020-12-13 DIAGNOSIS — M549 Dorsalgia, unspecified: Secondary | ICD-10-CM | POA: Diagnosis not present

## 2020-12-13 DIAGNOSIS — Z7901 Long term (current) use of anticoagulants: Secondary | ICD-10-CM

## 2020-12-13 DIAGNOSIS — Z794 Long term (current) use of insulin: Secondary | ICD-10-CM | POA: Diagnosis not present

## 2020-12-13 DIAGNOSIS — I509 Heart failure, unspecified: Secondary | ICD-10-CM | POA: Diagnosis not present

## 2020-12-13 DIAGNOSIS — R0600 Dyspnea, unspecified: Secondary | ICD-10-CM

## 2020-12-13 DIAGNOSIS — Z91040 Latex allergy status: Secondary | ICD-10-CM

## 2020-12-13 DIAGNOSIS — I5031 Acute diastolic (congestive) heart failure: Secondary | ICD-10-CM | POA: Diagnosis present

## 2020-12-13 DIAGNOSIS — E669 Obesity, unspecified: Secondary | ICD-10-CM | POA: Diagnosis present

## 2020-12-13 DIAGNOSIS — R7989 Other specified abnormal findings of blood chemistry: Secondary | ICD-10-CM | POA: Diagnosis not present

## 2020-12-13 DIAGNOSIS — I482 Chronic atrial fibrillation, unspecified: Secondary | ICD-10-CM | POA: Diagnosis present

## 2020-12-13 DIAGNOSIS — R0689 Other abnormalities of breathing: Secondary | ICD-10-CM | POA: Diagnosis not present

## 2020-12-13 DIAGNOSIS — Z833 Family history of diabetes mellitus: Secondary | ICD-10-CM

## 2020-12-13 DIAGNOSIS — Z79899 Other long term (current) drug therapy: Secondary | ICD-10-CM

## 2020-12-13 DIAGNOSIS — E782 Mixed hyperlipidemia: Secondary | ICD-10-CM

## 2020-12-13 DIAGNOSIS — E0865 Diabetes mellitus due to underlying condition with hyperglycemia: Secondary | ICD-10-CM

## 2020-12-13 DIAGNOSIS — E785 Hyperlipidemia, unspecified: Secondary | ICD-10-CM | POA: Diagnosis present

## 2020-12-13 DIAGNOSIS — I2510 Atherosclerotic heart disease of native coronary artery without angina pectoris: Secondary | ICD-10-CM | POA: Diagnosis present

## 2020-12-13 DIAGNOSIS — Z8249 Family history of ischemic heart disease and other diseases of the circulatory system: Secondary | ICD-10-CM

## 2020-12-13 DIAGNOSIS — Z91041 Radiographic dye allergy status: Secondary | ICD-10-CM

## 2020-12-13 DIAGNOSIS — G4733 Obstructive sleep apnea (adult) (pediatric): Secondary | ICD-10-CM | POA: Diagnosis present

## 2020-12-13 DIAGNOSIS — Z818 Family history of other mental and behavioral disorders: Secondary | ICD-10-CM

## 2020-12-13 DIAGNOSIS — K7689 Other specified diseases of liver: Secondary | ICD-10-CM | POA: Diagnosis not present

## 2020-12-13 DIAGNOSIS — R000 Tachycardia, unspecified: Secondary | ICD-10-CM | POA: Diagnosis not present

## 2020-12-13 DIAGNOSIS — R0902 Hypoxemia: Secondary | ICD-10-CM | POA: Diagnosis not present

## 2020-12-13 DIAGNOSIS — G8929 Other chronic pain: Secondary | ICD-10-CM | POA: Diagnosis present

## 2020-12-13 DIAGNOSIS — F319 Bipolar disorder, unspecified: Secondary | ICD-10-CM | POA: Diagnosis present

## 2020-12-13 DIAGNOSIS — G43109 Migraine with aura, not intractable, without status migrainosus: Secondary | ICD-10-CM | POA: Diagnosis present

## 2020-12-13 DIAGNOSIS — Z20822 Contact with and (suspected) exposure to covid-19: Secondary | ICD-10-CM | POA: Diagnosis present

## 2020-12-13 DIAGNOSIS — I517 Cardiomegaly: Secondary | ICD-10-CM | POA: Diagnosis not present

## 2020-12-13 DIAGNOSIS — R0602 Shortness of breath: Secondary | ICD-10-CM

## 2020-12-13 DIAGNOSIS — R600 Localized edema: Secondary | ICD-10-CM | POA: Diagnosis not present

## 2020-12-13 DIAGNOSIS — I10 Essential (primary) hypertension: Secondary | ICD-10-CM | POA: Diagnosis not present

## 2020-12-13 DIAGNOSIS — E1140 Type 2 diabetes mellitus with diabetic neuropathy, unspecified: Secondary | ICD-10-CM | POA: Diagnosis present

## 2020-12-13 DIAGNOSIS — G2581 Restless legs syndrome: Secondary | ICD-10-CM | POA: Diagnosis present

## 2020-12-13 DIAGNOSIS — N390 Urinary tract infection, site not specified: Secondary | ICD-10-CM | POA: Diagnosis present

## 2020-12-13 DIAGNOSIS — J4520 Mild intermittent asthma, uncomplicated: Secondary | ICD-10-CM | POA: Diagnosis present

## 2020-12-13 DIAGNOSIS — I700 Atherosclerosis of aorta: Secondary | ICD-10-CM | POA: Diagnosis not present

## 2020-12-13 DIAGNOSIS — J811 Chronic pulmonary edema: Secondary | ICD-10-CM | POA: Diagnosis not present

## 2020-12-13 DIAGNOSIS — Z888 Allergy status to other drugs, medicaments and biological substances status: Secondary | ICD-10-CM

## 2020-12-13 DIAGNOSIS — I110 Hypertensive heart disease with heart failure: Secondary | ICD-10-CM | POA: Diagnosis not present

## 2020-12-13 DIAGNOSIS — J9621 Acute and chronic respiratory failure with hypoxia: Secondary | ICD-10-CM | POA: Diagnosis present

## 2020-12-13 DIAGNOSIS — R399 Unspecified symptoms and signs involving the genitourinary system: Secondary | ICD-10-CM

## 2020-12-13 DIAGNOSIS — K5730 Diverticulosis of large intestine without perforation or abscess without bleeding: Secondary | ICD-10-CM | POA: Diagnosis not present

## 2020-12-13 DIAGNOSIS — J9601 Acute respiratory failure with hypoxia: Secondary | ICD-10-CM | POA: Diagnosis present

## 2020-12-13 DIAGNOSIS — R109 Unspecified abdominal pain: Secondary | ICD-10-CM | POA: Diagnosis not present

## 2020-12-13 DIAGNOSIS — E119 Type 2 diabetes mellitus without complications: Secondary | ICD-10-CM

## 2020-12-13 HISTORY — DX: Heart failure, unspecified: I50.9

## 2020-12-13 LAB — COMPREHENSIVE METABOLIC PANEL
ALT: 12 U/L (ref 0–44)
AST: 16 U/L (ref 15–41)
Albumin: 3.8 g/dL (ref 3.5–5.0)
Alkaline Phosphatase: 89 U/L (ref 38–126)
Anion gap: 8 (ref 5–15)
BUN: 10 mg/dL (ref 8–23)
CO2: 26 mmol/L (ref 22–32)
Calcium: 9 mg/dL (ref 8.9–10.3)
Chloride: 105 mmol/L (ref 98–111)
Creatinine, Ser: 0.59 mg/dL (ref 0.44–1.00)
GFR, Estimated: >60 mL/min (ref >60)
Glucose, Bld: 111 mg/dL — ABNORMAL HIGH (ref 70–99)
Potassium: 4.2 mmol/L (ref 3.5–5.1)
Sodium: 139 mmol/L (ref 135–145)
Total Bilirubin: 0.8 mg/dL (ref 0.3–1.2)
Total Protein: 7.1 g/dL (ref 6.5–8.1)

## 2020-12-13 LAB — URINALYSIS, ROUTINE W REFLEX MICROSCOPIC
Bilirubin Urine: NEGATIVE
Glucose, UA: NEGATIVE mg/dL
Hgb urine dipstick: NEGATIVE
Ketones, ur: NEGATIVE mg/dL
Leukocytes,Ua: NEGATIVE
Nitrite: NEGATIVE
Protein, ur: NEGATIVE mg/dL
Specific Gravity, Urine: 1.005 (ref 1.005–1.030)
pH: 8 (ref 5.0–8.0)

## 2020-12-13 LAB — CBC WITH DIFFERENTIAL/PLATELET
Abs Immature Granulocytes: 0.08 10*3/uL — ABNORMAL HIGH (ref 0.00–0.07)
Basophils Absolute: 0.1 10*3/uL (ref 0.0–0.1)
Basophils Relative: 0 %
Eosinophils Absolute: 0 10*3/uL (ref 0.0–0.5)
Eosinophils Relative: 0 %
HCT: 38.1 % (ref 36.0–46.0)
Hemoglobin: 12.2 g/dL (ref 12.0–15.0)
Immature Granulocytes: 1 %
Lymphocytes Relative: 13 %
Lymphs Abs: 1.5 10*3/uL (ref 0.7–4.0)
MCH: 30.6 pg (ref 26.0–34.0)
MCHC: 32 g/dL (ref 30.0–36.0)
MCV: 95.5 fL (ref 80.0–100.0)
Monocytes Absolute: 0.6 10*3/uL (ref 0.1–1.0)
Monocytes Relative: 5 %
Neutro Abs: 9 10*3/uL — ABNORMAL HIGH (ref 1.7–7.7)
Neutrophils Relative %: 81 %
Platelets: 157 10*3/uL (ref 150–400)
RBC: 3.99 MIL/uL (ref 3.87–5.11)
RDW: 15.7 % — ABNORMAL HIGH (ref 11.5–15.5)
WBC: 11.2 10*3/uL — ABNORMAL HIGH (ref 4.0–10.5)
nRBC: 0 % (ref 0.0–0.2)

## 2020-12-13 LAB — BLOOD GAS, VENOUS
Acid-Base Excess: 3.1 mmol/L — ABNORMAL HIGH (ref 0.0–2.0)
Bicarbonate: 26.6 mmol/L (ref 20.0–28.0)
FIO2: 40
O2 Saturation: 91.4 %
Patient temperature: 36.5
pCO2, Ven: 45.4 mmHg (ref 44.0–60.0)
pH, Ven: 7.399 (ref 7.250–7.430)
pO2, Ven: 62.6 mmHg — ABNORMAL HIGH (ref 32.0–45.0)

## 2020-12-13 LAB — URINALYSIS, COMPLETE
Bilirubin, UA: NEGATIVE
Glucose, UA: NEGATIVE
Ketones, UA: NEGATIVE
Leukocytes,UA: NEGATIVE
Nitrite, UA: NEGATIVE
Protein,UA: NEGATIVE
Specific Gravity, UA: 1.015 (ref 1.005–1.030)
Urobilinogen, Ur: 0.2 mg/dL (ref 0.2–1.0)
pH, UA: 7.5 (ref 5.0–7.5)

## 2020-12-13 LAB — MICROSCOPIC EXAMINATION
RBC, Urine: NONE SEEN /hpf (ref 0–2)
WBC, UA: NONE SEEN /hpf (ref 0–5)

## 2020-12-13 LAB — TROPONIN I (HIGH SENSITIVITY)
Troponin I (High Sensitivity): 30 ng/L — ABNORMAL HIGH (ref <18)
Troponin I (High Sensitivity): 26 ng/L — ABNORMAL HIGH (ref <18)

## 2020-12-13 LAB — LIPASE, BLOOD: Lipase: 28 U/L (ref 11–51)

## 2020-12-13 LAB — BRAIN NATRIURETIC PEPTIDE: B Natriuretic Peptide: 474 pg/mL — ABNORMAL HIGH (ref 0.0–100.0)

## 2020-12-13 MED ORDER — ACETAMINOPHEN 325 MG PO TABS
650.0000 mg | ORAL_TABLET | Freq: Four times a day (QID) | ORAL | Status: DC | PRN
  Administered 2020-12-16: 650 mg via ORAL
  Filled 2020-12-13 (×2): qty 2

## 2020-12-13 MED ORDER — FUROSEMIDE 10 MG/ML IJ SOLN
60.0000 mg | Freq: Once | INTRAMUSCULAR | Status: AC
  Administered 2020-12-13: 60 mg via INTRAVENOUS
  Filled 2020-12-13: qty 6

## 2020-12-13 MED ORDER — CEFDINIR 300 MG PO CAPS: 300.0000 mg | ORAL_CAPSULE | Freq: Two times a day (BID) | ORAL | 0 refills | Status: DC

## 2020-12-13 MED ORDER — ENOXAPARIN SODIUM 40 MG/0.4ML IJ SOSY
40.0000 mg | PREFILLED_SYRINGE | INTRAMUSCULAR | Status: DC
  Administered 2020-12-13: 40 mg via SUBCUTANEOUS
  Filled 2020-12-13: qty 0.4

## 2020-12-13 MED ORDER — FENTANYL CITRATE (PF) 100 MCG/2ML IJ SOLN
25.0000 ug | Freq: Once | INTRAMUSCULAR | Status: AC
  Administered 2020-12-13: 25 ug via INTRAVENOUS
  Filled 2020-12-13: qty 2

## 2020-12-13 MED ORDER — FENTANYL CITRATE (PF) 100 MCG/2ML IJ SOLN
50.0000 ug | Freq: Once | INTRAMUSCULAR | Status: AC
  Administered 2020-12-13: 50 ug via INTRAVENOUS
  Filled 2020-12-13: qty 2

## 2020-12-13 MED ORDER — SODIUM CHLORIDE 0.9 % IV SOLN
1.0000 g | Freq: Once | INTRAVENOUS | Status: AC
  Administered 2020-12-13: 1 g via INTRAVENOUS
  Filled 2020-12-13: qty 10

## 2020-12-13 NOTE — ED Triage Notes (Signed)
Patient to ED with complaints of right flank pain. EMS reports that when they arrived to house patient's sat's were 78% on RA. Placed on NRB. Patient states that she has taken Dilaudid PTA around 12pm.

## 2020-12-13 NOTE — ED Provider Notes (Signed)
[facility] EMERGENCY DEPARTMENT Provider Note   CSN: [id number] Arrival date & time: 12/13/20  1543     History Chief Complaint  Patient presents with   Flank Pain    Amber Stephenson is a 75 y.o. female.  HPI  76 year old female with a history of acute on chronic respiratory failure with hypoxia, A. fib with RVR, bipolar disorder, CAD, cardiomegaly, chronic anticoagulation, CHF, diabetes, neuropathy, hypertension, hyperlipidemia, nonalcoholic fatty liver disease, OSA, pulmonary hypertension, renal insufficiency, who presents to the emergency department today for evaluation of shortness of breath and concern for UTI.  Patient states that she has been having some urinary symptoms for the last few days.  She also felt like her diuretic was making her urinate too much so she has stopped taking it as she should be.  She has now developed some lower extremity swelling and shortness of breath.  On EMS arrival today her sats were in the 31s.  She was placed on nonrebreather.  She denies any chest pain.  She is not had any significant cough and denies any fevers.  She has had some lower abdominal pain dysuria, urgency and frequency.  She was seen by her PCP earlier today and she reports she was started on antibiotic at that time. Also c/o chronic back pain  Past Medical History:  Diagnosis Date   Acute on chronic respiratory failure with hypoxia ([place]) 01/22/2019   Atrial fibrillation with RVR 05/19/2017   Bipolar I disorder 01/23/2015   CAD (coronary artery disease)    Nonobstructive; Managed by Dr. Bronson Ing   Cardiomegaly 01/12/2018   Chronic anticoagulation 05/30/2015   Chronic atrial fibrillation ([place]) 01/04/2015   Chronic back pain    Lower back   Chronic diastolic CHF (congestive heart failure) 05/30/2015   Diabetes mellitus, type 2, without complication    Diabetic neuropathy 02/06/2016   Dysrhythmia    A-Fib   Essential hypertension    Herpes genitalis in women 07/16/2015    Hyperlipidemia    Hypoxia 02/01/2019   Insomnia 01/23/2015   Lipoma 02/08/2015   Major depressive disorder    Migraine headache with aura 02/12/2016   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[place]) 04/21/2019   NAFL (nonalcoholic fatty liver) 5/63/8756   OSA (obstructive sleep apnea) 02/24/2019   10/09/2018 - HST  - AHI 40.6    Pulmonary hypertension    Renal insufficiency    Managed by Dr. Wallace Keller   RLS (restless legs syndrome) 04/27/2015    Patient Active Problem List   Diagnosis Date Noted   Acute exacerbation of CHF (congestive heart failure) ([place]) 12/13/2020   Chronic post-traumatic stress disorder 12/06/2020   Bipolar I disorder, most recent episode (or current) manic ([place]) 12/06/2020   Tremor 12/06/2020   Senile nuclear sclerosis 12/06/2020   Senile corneal changes 12/06/2020   Psychoses ([place]) 12/06/2020   Presbyopia 12/06/2020   Postmenopausal bleeding 12/06/2020   Postcoital bleeding 12/06/2020   Pinguecula 12/06/2020   Pain in joint, shoulder region 12/06/2020   Other psychological or physical stress 12/06/2020   Other acquired hammer toe 12/06/2020   Osteopenia 12/06/2020   Opioid dependence ([place]) 12/06/2020   Non-neoplastic nevus 12/06/2020   Essential hypertriglyceridemia 12/06/2020   Hypermetropia 12/06/2020   Debility 12/06/2020   Cystitis 12/06/2020   Depressed bipolar I disorder in full remission ([place]) 12/06/2020   Benzodiazepine dependence ([place]) 12/06/2020   Benign neoplasm of skin 12/06/2020   Anxiety 12/06/2020   Functional diarrhea 10/26/2020   Diabetes mellitus due to underlying condition  with hyperglycemia, with long-term current use of insulin ([place]) 09/24/2020   Dyslipidemia 09/24/2020   Head trauma 09/17/2020   Chronic respiratory failure with hypoxia ([place]) 04/25/2020   Chronic constipation 04/25/2020   Back pain/sacroiliitis--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   Fall at home, initial encounter  04/23/2020   Ambulatory dysfunction--back pain and falls 04/23/2020   Chronic pain syndrome 08/22/2019   Nerve pain 08/22/2019   Myofascial pain dysfunction syndrome 08/22/2019   Dyspnea 08/22/2019   CAD (coronary artery disease)    Hypocalcemia    S/P shoulder replacement, 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   History of adenomatous polyp of colon 05/21/2019   Polypharmacy 05/21/2019   Benign paroxysmal positional vertigo due to bilateral vestibular disorder 05/20/2019   Hyperlipidemia associated with type 2 diabetes mellitus ([place]) 05/20/2019   Vertigo 04/25/2019   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[place]) 04/21/2019   OSA (obstructive sleep apnea) 02/24/2019   Allergic rhinitis 02/24/2019   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   Morbid obesity with alveolar hypoventilation ([place]) 07/06/2018   At risk for adverse drug event 07/06/2018   Cardiomegaly 22/07/5425   Non-alcoholic fatty liver disease 01/12/2018   Mild intermittent asthma 01/12/2018   Senile osteoporosis 12/15/2017   Fibromyalgia 03/19/2017   Migraine headache with aura 02/12/2016   Diabetic neuropathy associated with type 2 diabetes mellitus ([place]) 02/06/2016   Bipolar 1 disorder, manic, moderate ([place]) 10/04/2015   Depression, recurrent ([place]) 10/03/2015   Herpes genitalis in women 07/16/2015   Long term current use of anticoagulant 05/30/2015   Family history of coronary arteriosclerosis 05/30/2015   Chronic diastolic heart failure ([place]) 05/30/2015   Dyspnea on exertion    Hypertension associated with diabetes ([place])    Restless legs 04/27/2015   Lipoma 02/08/2015   Insomnia 01/23/2015   Chronic back pain 01/04/2015   Chronic atrial fibrillation ([place]) 01/04/2015   Uncontrolled type 2 diabetes mellitus with peripheral autonomic neuropathy ([place])    Hyperlipidemia     Past Surgical History:  Procedure Laterality Date   BREAST REDUCTION SURGERY     EYE SURGERY Right    cateracts   HAMMER TOE SURGERY     LEFT HEART CATH AND  CORONARY ANGIOGRAPHY N/A 02/03/2018   Procedure: LEFT HEART CATH AND CORONARY ANGIOGRAPHY;  Surgeon: Martinique, Peter M, MD;  Location: [facility];  Service: Cardiovascular;  Laterality: N/A;   REVERSE SHOULDER ARTHROPLASTY Right 08/19/2019   Procedure: REVERSE SHOULDER ARTHROPLASTY;  Surgeon: Netta Cedars, MD;  Location: [facility];  Service: Orthopedics;  Laterality: Right;  interscalene block   SHOULDER SURGERY Right    "I BROKE MY SHOUDLER   THIGH SURGERY     "TO REMOVE A TUMOR "     OB History   No obstetric history on file.     Family History  Problem Relation Age of Onset   Diabetes Mother    Heart disease Mother    Heart disease Brother    Heart disease Sister        CABG   Diabetes Sister    Alzheimer's disease Father    Alcohol abuse Sister    Mental illness Brother    Diabetes Brother     Social History   Tobacco Use   Smoking status: Never   Smokeless tobacco: Never  Vaping Use   Vaping Use: Never used  Substance Use Topics   Alcohol use: Not Currently    Alcohol/week: 0.0 standard drinks   Drug  use: No    Home Medications Prior to Admission medications   Medication Sig Start Date End Date Taking? Authorizing Provider  albuterol (PROVENTIL) (2.5 MG/3ML) 0.083% nebulizer solution Take 3 mLs (2.5 mg total) by nebulization every 6 (six) hours as needed for wheezing or shortness of breath. 08/09/20  Yes Gerlene Fee, NP  albuterol (VENTOLIN HFA) 108 (90 Base) MCG/ACT inhaler Inhale 2 puffs into the lungs every 6 (six) hours as needed for wheezing or shortness of breath. 08/09/20  Yes Gerlene Fee, NP  apixaban (ELIQUIS) 5 MG TABS tablet Take 1 tablet (5 mg total) by mouth 2 (two) times daily. 08/09/20  Yes Gerlene Fee, NP  ARIPiprazole (ABILIFY) 10 MG tablet Take 3 tablets (30 mg total) by mouth daily. 08/09/20  Yes Gerlene Fee, NP  atorvastatin (LIPITOR) 40 MG tablet TAKE 1 TABLET DAILY 11/06/20  Yes Stacks, Cletus Gash, MD  carbamazepine (CARBATROL)  100 MG 12 hr capsule Take by mouth at bedtime. 08/22/20  Yes [provider]  denosumab (PROLIA) 60 MG/ML SOSY injection Inject 60 mg into the skin every 6 (six) months. 08/14/20  Yes [name], MD  diltiazem (CARDIZEM CD) 120 MG 24 hr capsule Take 1 capsule (120 mg total) by mouth at bedtime. 09/13/20  Yes Strader, [place], PA-C  escitalopram (LEXAPRO) 10 MG tablet Take 1 tablet (10 mg total) by mouth daily. Patient taking differently: Take 10 mg by mouth 2 (two) times daily. 08/09/20  Yes Gerlene Fee, NP  flurazepam (DALMANE) 30 MG capsule Take 30 mg by mouth at bedtime as needed. 10/08/20  Yes [provider]  Fluticasone-Salmeterol (ADVAIR DISKUS) 500-50 MCG/DOSE AEPB Inhale 1 puff into the lungs in the morning and at bedtime. 08/09/20  Yes Gerlene Fee, NP  furosemide (LASIX) 20 MG tablet Take 0.5 tablets (10 mg total) by mouth daily. TAKE 1 TABLET EVERY MORNING 08/31/20  Yes Strader, [place], PA-C  Galcanezumab-gnlm [facility]) 120 MG/ML SOAJ Administer once a month starting 09/27/2020 10/01/20  Yes Cameron Sprang, MD  HYDROmorphone (DILAUDID) 2 MG tablet Take 1 tablet (2 mg total) by mouth every 6 (six) hours as needed for severe pain. 11/30/20  Yes Lovorn, Jinny Blossom, MD  ibuprofen (ADVIL) 800 MG tablet Take 1 tablet (800 mg total) by mouth every 8 (eight) hours as needed. 11/14/20  Yes Stacks, Cletus Gash, MD  icosapent Ethyl (VASCEPA) 1 g capsule TAKE 2 CAPSULES TWICE A DAY WITH MEALS 08/09/20  Yes Gerlene Fee, NP  insulin glargine (LANTUS SOLOSTAR) 100 UNIT/ML Solostar Pen Inject 45 Units into the skin 2 (two) times daily. 09/24/20  Yes Shamleffer, Melanie Crazier, MD  LORazepam (ATIVAN) 1 MG tablet Take 1 tablet (1 mg total) by mouth 2 (two) times daily. And give 2 tablets by mouth at bedtime 08/09/20  Yes Gerlene Fee, NP  meclizine (ANTIVERT) 12.5 MG tablet Take 1 tablet (12.5 mg total) by mouth 3 (three) times daily as needed for dizziness. 08/09/20  Yes Gerlene Fee, NP  metFORMIN (GLUCOPHAGE) 500 MG tablet Take 2 tablets (1,000 mg total) by mouth 2 (two) times daily with a meal. Patient taking differently: Take 500 mg by mouth 2 (two) times daily with a meal. 09/24/20  Yes Shamleffer, Melanie Crazier, MD  metoprolol succinate (TOPROL XL) 25 MG 24 hr tablet Take 1 tablet (25 mg total) by mouth in the morning and at bedtime. 08/31/20  Yes Strader, [place] M, PA-C  metoprolol succinate (TOPROL-XL) 100 MG 24 hr tablet  Take 1 tablet (100 mg total) by mouth in the morning and at bedtime. 12/04/20  Yes Strader, [place] M, PA-C  polyethylene glycol (MIRALAX / GLYCOLAX) 17 g packet Take 17 g by mouth daily. 04/25/20  Yes Emokpae, Courage, MD  potassium chloride SA (KLOR-CON) 20 MEQ tablet Take 1 tablet (20 mEq total) by mouth daily. 08/09/20  Yes Gerlene Fee, NP  pregabalin (LYRICA) 200 MG capsule Take 1 capsule (200 mg total) by mouth 2 (two) times daily. 11/30/20  Yes Lovorn, Megan, MD  Semaglutide (RYBELSUS) 14 MG TABS Take 14 mg by mouth daily. 09/24/20  Yes Shamleffer, Melanie Crazier, MD  SUMAtriptan (IMITREX) 100 MG tablet Take 100 mg by mouth as directed. 11/01/20  Yes [provider]  tiZANidine (ZANAFLEX) 4 MG capsule TAKE 1 CAPSULE BY MOUTH THREE TIMES DAILY AS NEEDED FOR MUSCLE SPASMS Patient taking differently: Take 4 mg by mouth 3 (three) times daily as needed for muscle spasms. 11/06/20  Yes [name], MD  traZODone (DESYREL) 100 MG tablet Take 400 mg by mouth at bedtime. 08/14/20  Yes [provider]  Vitamin D, Cholecalciferol, 25 MCG (1000 UT) TABS Take 1 tablet by mouth daily in the afternoon. 07/26/20  Yes [provider]  Janne Lab Oil (VENELEX) OINT Apply topically. Special Instructions: Apply to coccyx and bilateral buttocks q shift for prevention/ redness. Patient not taking: No sig reported    [provider]  cefdinir (OMNICEF) 300 MG capsule Take 1 capsule (300 mg total) by mouth 2 (two) times daily.  1 po BID Patient not taking: Reported on 12/13/2020 12/13/20   Dettinger, Fransisca Kaufmann, MD  Continuous Blood Gluc Sensor (DEXCOM G6 SENSOR) MISC 1 Device by Does not apply route as directed. 09/24/20   Shamleffer, Melanie Crazier, MD  Continuous Blood Gluc Transmit (DEXCOM G6 TRANSMITTER) MISC 1 Device by Does not apply route as directed. 09/24/20   Shamleffer, Melanie Crazier, MD  diphenoxylate-atropine (LOMOTIL) 2.5-0.025 MG tablet Take 2 tablets by mouth 4 (four) times daily as needed for diarrhea or loose stools. Patient not taking: No sig reported 10/26/20   Ivy Lynn, NP  fluconazole (DIFLUCAN) 150 MG tablet Take one tablet now. May repeat in 3 days if symptoms continue Patient not taking: No sig reported 11/01/20   Gwenlyn Perking, FNP  insulin lispro (HUMALOG KWIKPEN) 100 UNIT/ML KwikPen SSI changed by endocrine Patient not taking: No sig reported 11/14/20   [name], MD  neomycin-polymyxin-hydrocortisone (CORTISPORIN) OTIC solution Place 4 drops into the right ear 4 (four) times daily. Patient not taking: No sig reported 11/14/20   [name], MD  NON FORMULARY Diet: _____ Regular, ___  __ NAS, ___x____Consistent Carbohydrate, _______NPO _____Other 04/24/20   [provider]  Probiotic, Lactobacillus, CAPS Take 1 capsule by mouth daily. Patient not taking: No sig reported 10/26/20   Ivy Lynn, NP  SUMAtriptan (IMITREX) 50 MG tablet TAKE 1 TABLET BY MOUTH AT ONSET OF HEADACHE. MAY REPEAT IN 2 HOURS IF HEADACHE PERSISTS OR REOCCURS. MAX TWO TABLETS PER 24 HOURS Patient not taking: No sig reported 12/12/20   [name], MD  temazepam (RESTORIL) 30 MG capsule Take 30 mg by mouth at bedtime as needed. Patient not taking: No sig reported 11/29/20   [provider]    Allergies    Ivp dye [iodinated diagnostic agents], Codeine, Iodine, and Latex  Review of Systems   Review of Systems  Constitutional:  Negative for chills and fever.  HENT:  Negative for  ear pain and sore throat.   Eyes:  Negative for pain and visual disturbance.  Respiratory:  Positive for shortness of breath. Negative for cough.   Cardiovascular:  Positive for leg swelling. Negative for chest pain.  Gastrointestinal:  Positive for abdominal pain. Negative for constipation, diarrhea, nausea and vomiting.  Genitourinary:  Positive for dysuria, flank pain and urgency. Negative for hematuria.  Musculoskeletal:  Negative for back pain.  Skin:  Negative for rash.  Neurological:  Negative for seizures and numbness.  All other systems reviewed and are negative.  Physical Exam Updated Vital Signs BP (!) 157/99   Pulse (!) 138   Temp 97.8 F (36.6 C) (Oral)   Resp (!) 25   Ht 5\' 2"  (1.575 m)   Wt 90.7 kg   SpO2 97%   BMI 36.58 kg/m   Physical Exam Vitals and nursing note reviewed.  Constitutional:      General: She is not in acute distress.    Appearance: She is well-developed.  HENT:     Head: Normocephalic and atraumatic.  Eyes:     Conjunctiva/sclera: Conjunctivae normal.  Cardiovascular:     Heart sounds: No murmur heard.    Comments: Irregularly irregular rhythm Pulmonary:     Effort: No respiratory distress.     Breath sounds: Rales present. No wheezing or rhonchi.     Comments: tachypneic Abdominal:     General: Bowel sounds are normal.     Palpations: Abdomen is soft.     Tenderness: There is abdominal tenderness (LLQ, RLQ). There is right CVA tenderness. There is no guarding or rebound.  Musculoskeletal:     Cervical back: Neck supple.     Right lower leg: Edema present.     Left lower leg: Edema present.     Comments: TTP to the bilat buttock over the SI joints, apparent muscle spasm over the right upper buttock. No erythema, warmth or crepitus noted. No skin breakdown.   Skin:    General: Skin is warm and dry.  Neurological:     Mental Status: She is alert.    ED Results / Procedures / Treatments   Labs (all labs ordered are listed, but  only abnormal results are displayed) Labs Reviewed  CBC WITH DIFFERENTIAL/PLATELET - Abnormal; Notable for the following components:      Result Value   WBC 11.2 (*)    RDW 15.7 (*)    Neutro Abs 9.0 (*)    Abs Immature Granulocytes 0.08 (*)    All other components within normal limits  COMPREHENSIVE METABOLIC PANEL - Abnormal; Notable for the following components:   Glucose, Bld 111 (*)    All other components within normal limits  BRAIN NATRIURETIC PEPTIDE - Abnormal; Notable for the following components:   B Natriuretic Peptide 474.0 (*)    All other components within normal limits  BLOOD GAS, VENOUS - Abnormal; Notable for the following components:   pO2, Ven 62.6 (*)    Acid-Base Excess 3.1 (*)    All other components within normal limits  URINALYSIS, ROUTINE W REFLEX MICROSCOPIC - Abnormal; Notable for the following components:   Color, Urine STRAW (*)    All other components within normal limits  TROPONIN I (HIGH SENSITIVITY) - Abnormal; Notable for the following components:   Troponin I (High Sensitivity) 30 (*)    All other components within normal limits  TROPONIN I (HIGH SENSITIVITY) - Abnormal; Notable for the following components:   Troponin I (High Sensitivity) 26 (*)  All other components within normal limits  LIPASE, BLOOD    EKG EKG Interpretation  Date/Time:  Thursday December 13 2020 16:05:17 EDT Ventricular Rate:  111 PR Interval:    QRS Duration: 86 QT Interval:  369 QTC Calculation: 490 R Axis:   -22 Text Interpretation: Atrial fibrillation Ventricular premature complex Borderline left axis deviation Low voltage, precordial leads Abnormal R-wave progression, early transition Borderline repolarization abnormality Minimal ST elevation, inferior leads Borderline prolonged QT interval Confirmed by Thamas Jaegers ([id number]) on 12/13/2020 4:44:59 PM  Radiology CT ABDOMEN PELVIS WO CONTRAST  Result Date: 12/13/2020 CLINICAL DATA:  Flank pain, kidney stone  suspected. EXAM: CT ABDOMEN AND PELVIS WITHOUT CONTRAST TECHNIQUE: Multidetector CT imaging of the abdomen and pelvis was performed following the standard protocol without IV contrast. COMPARISON:  CT December 26, 2019. FINDINGS: Lower chest: Tiny bilateral pleural effusions. Bilateral ground-glass interstitial opacities with multifocal nodular consolidations. Cardiomegaly with mitral annulus calcifications and left atrial dilation. Hepatobiliary: Stable subcentimeter hypodense hepatic lesion too small to accurately characterize. Gallbladder is unremarkable. No biliary ductal dilation. Pancreas: Within normal limits. Spleen: Within normal limits. Adrenals/Urinary Tract: Left adrenal thickening without nodularity by size criteria, favor hyperplasia. Right adrenal glands unremarkable. No hydronephrosis. No renal calculi, ureteral or bladder calculi visualized. Urinary bladder is unremarkable. Stomach/Bowel: Stomach is decompressed. No pathologic dilation of small bowel. The appendix and terminal ileum appear normal. Sigmoid diverticulosis without findings of acute diverticulitis. Vascular/Lymphatic: Aortic atherosclerosis without aneurysmal dilation. No pathologically enlarged abdominal or pelvic lymph nodes. Reproductive: Uterus and bilateral adnexa are unremarkable. Other: No abdominopelvic ascites. Multifocal areas of skin thickening and subcutaneous body wall edema including in the right gluteal subcutaneous soft tissues in the left anterior abdominal wall and flank., nonspecific recommend correlation with direct visualization. Musculoskeletal: Multilevel degenerative changes spine. Similar mild wedging deformities of the T11, T12 and L2 vertebral bodies. No acute osseous abnormality. IMPRESSION: 1. No acute intra-abdominal or pelvic findings. Specifically, no evidence of obstructive uropathy. 2. Tiny bilateral pleural effusions with bilateral ground-glass interstitial opacities and multifocal nodular consolidations.  Findings are concerning for multifocal pneumonia. 3. Multifocal areas of skin thickening and subcutaneous body wall edema including in the right gluteal subcutaneous soft tissues in the left anterior abdominal wall and flank. , nonspecific recommend correlation with direct visualization. 4. Sigmoid diverticulosis without findings of acute diverticulitis. 5. Cardiomegaly with mitral annulus calcifications and left atrial dilation, consider further evaluation with cardiac echo if not previously performed. 6.  Aortic Atherosclerosis (ICD10-I70.0). Electronically Signed   By: Dahlia Bailiff MD   On: 12/13/2020 20:24   DG Chest Port 1 View  Result Date: 12/13/2020 CLINICAL DATA:  Shortness of breath and right flank pain. Atrial fibrillation. Cardiomyopathy. EXAM: PORTABLE CHEST 1 VIEW COMPARISON:  09/17/2020 FINDINGS: Patient rotated to the left. Right shoulder arthroplasty. Moderate cardiomegaly. No right-sided pleural effusion. No pneumothorax. Mild pulmonary interstitial prominence and indistinctness. The left lung base is not well evaluated secondary to overlying soft tissues and patient body habitus. IMPRESSION: Cardiomegaly and mild pulmonary venous congestion. Suboptimal evaluation of the left lung base. Electronically Signed   By: Abigail Miyamoto M.D.   On: 12/13/2020 18:41    Procedures Procedures   CRITICAL CARE Performed by: Rodney Booze   Total critical care time: 35 minutes  Critical care time was exclusive of separately billable procedures and treating other patients.  Critical care was necessary to treat or prevent imminent or life-threatening deterioration.  Critical care was time spent personally by me on the following activities:  development of treatment plan with patient and/or surrogate as well as nursing, discussions with consultants, evaluation of patient's response to treatment, examination of patient, obtaining history from patient or surrogate, ordering and performing  treatments and interventions, ordering and review of laboratory studies, ordering and review of radiographic studies, pulse oximetry and re-evaluation of patient's condition.  9:41 PM Cardiac monitoring reveals afib, hr low 100s (Rate & rhythm), as reviewed and interpreted by me. Cardiac monitoring was ordered due to tachycardia, hypoxia and to monitor patient for dysrhythmia.   Medications Ordered in ED Medications  furosemide (LASIX) injection 60 mg (60 mg Intravenous Given 12/13/20 1841)  fentaNYL (SUBLIMAZE) injection 50 mcg (50 mcg Intravenous Given 12/13/20 1842)    ED Course  I have reviewed the triage vital signs and the nursing notes.  Pertinent labs & imaging results that were available during my care of the patient were reviewed by me and considered in my medical decision making (see chart for details).    MDM Rules/Calculators/A&P                          75 year old female presents with UTI symptoms and hypoxia.  Reviewed/interpreted labs CBC with mild leukocytosis, no anemia CMP grossly unremarkable Lipase negative Troponins are marginally elevated but remained flat, likely due to demand in setting of acute CHF exacerbation BNP elevated consistent with CHF exacerbation UA negative for UTI  EKG - Atrial fibrillation Ventricular premature complex Borderline left axis deviation Low voltage, precordial leads Abnormal R-wave progression, early transition Borderline repolarization abnormality Minimal ST elevation, inferior leads Borderline prolonged QT interval  Reviewed/interpreted imaging CXR -  Cardiomegaly and mild pulmonary venous congestion. Suboptimal evaluation of the left lung base.  CT abd/pelvis - 1. No acute intra-abdominal or pelvic findings. Specifically, no evidence of obstructive uropathy. 2. Tiny bilateral pleural effusions with bilateral ground-glass interstitial opacities and multifocal nodular consolidations. Findings are concerning for multifocal  pneumonia. 3. Multifocal areas of skin thickening and subcutaneous body wall edema including in the right gluteal subcutaneous soft tissues in the left anterior abdominal wall and flank. , nonspecific recommend correlation with direct visualization. 4. Sigmoid diverticulosis without findings of acute diverticulitis. 5. Cardiomegaly with mitral annulus calcifications and left atrial dilation, consider further evaluation with cardiac echo if not previously performed. 6.  Aortic Atherosclerosis (ICD10-I70.0)  - lower suspicion for pna w/o fevers, cough. Feel this is likely related to chf  - no evidence for skin infection on exam  Patient work-up revealing of acute CHF exacerbation.  She was started on diuresis here in the ED and has had a significant amount of output.  She does feel somewhat improved and she appears more comfortable on 5 L O2.  Feel that she will require admission for further diuresis and management.  9:32 PM CONSULT with Dr. [name] Cables who accepts patient for admission   Final Clinical Impression(s) / ED Diagnoses Final diagnoses:  Congestive heart failure, unspecified HF chronicity, unspecified heart failure type [facility])    Rx / DC Orders ED Discharge Orders     None        Bishop Dublin 12/13/20 2142    Luna Fuse, MD 12/21/20 1125

## 2020-12-13 NOTE — H&P (Signed)
History and Physical  Amber Stephenson MVE:[id number] DOB: May 14, 1946 DOA: 12/13/2020  Referring physician: Rodney Booze, PA-C  PCP: [name], MD  Patient coming from: Home  Chief Complaint: Shortness of breath  HPI: Amber Stephenson is a 75 y.o. female with medical history significant for  chronic atrial fibrillation on Eliquis, bipolar 1 disorder, major depressive disorder, nonobstructive CAD, chronic diastolic CHF, hypertension, type 2 diabetes, diabetic neuropathy, chronic lower back pain, hyperlipidemia, hypoxia, insomnia, migraine headaches, nonalcoholic asteatotic hepatitis, OSA, pulmonary hypertension, restless leg syndrome who presents to the emergency department due to 3-day onset of shortness of breath associated with dry cough and upper back pain.  She states that her Lasix was increased from 20 to 40 mg daily about a month ago, this was followed by increased urinary frequency and about a week ago, she got tired of frequent urination so she stopped taking her Lasix, about 3 days ago, she started to notice increased shortness of breath, abdominal and leg swelling, she also complained of suprapubic pain which resulted in consulting with PCP by phone this morning and when she was prescribed with an antibiotic for UTI, she was also advised to restart oral antibiotics and to go to the ED if her symptoms does not improve.  She denies fever, chills, chest pain, headache, abdominal pain or constipation.  ED Course:  In the emergency department, she was intermittently tachypneic, BP was 163/97, patient was requiring supplemental oxygen via  at 5 LPM to maintain O2 sats above 90s.  Work-up in the ED showed normal CBC except for mild leukocytosis and normal BMP.  BNP 474, troponin x2- 30 > 26. CT abdomen and pelvis without contrast showed no acute intra-abdominal or pelvic findings.  Tiny bilateral pleural effusions with bilateral groundglass interstitial opacities multifocal nodular  consolidations with findings concerning for multifocal pneumonia was noted. Chest x-ray showed cardiomegaly and mild pulmonary venous congestion. Patient was treated with IV ceftriaxone due to presumed UTI, IV fentanyl and was given due to pain.  IV Lasix was given with an output of 400 cc at bedside.  Hospitalist was asked to admit patient for further evaluation and management.  Review of Systems: Constitutional: Negative for chills and fever.  HENT: Negative for ear pain and sore throat.   Eyes: Negative for pain and visual disturbance.  Respiratory: Positive for shortness of breath and cough Cardiovascular: Positive for leg swelling.  Negative for chest pain and palpitations.  Gastrointestinal: Positive for abdominal swelling.  Negative for vomiting.  Endocrine: Negative for polyphagia and polyuria.  Genitourinary: Positive for increased urinary frequency and suprapubic pain. Musculoskeletal: Negative for arthralgias and back pain.  Skin: Negative for color change and rash.  Allergic/Immunologic: Negative for immunocompromised state.  Neurological: Negative for tremors, syncope, speech difficulty Hematological: Does not bruise/bleed easily.  All other systems reviewed and are negative  Past Medical History:  Diagnosis Date   Acute on chronic respiratory failure with hypoxia ([place]) 01/22/2019   Atrial fibrillation with RVR 05/19/2017   Bipolar I disorder 01/23/2015   CAD (coronary artery disease)    Nonobstructive; Managed by Dr. Bronson Ing   Cardiomegaly 01/12/2018   Chronic anticoagulation 05/30/2015   Chronic atrial fibrillation ([place]) 01/04/2015   Chronic back pain    Lower back   Chronic diastolic CHF (congestive heart failure) 05/30/2015   Diabetes mellitus, type 2, without complication    Diabetic neuropathy 02/06/2016   Dysrhythmia    A-Fib   Essential hypertension    Herpes genitalis in women  07/16/2015   Hyperlipidemia    Hypoxia 02/01/2019   Insomnia 01/23/2015   Lipoma  02/08/2015   Major depressive disorder    Migraine headache with aura 02/12/2016   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[place]) 04/21/2019   NAFL (nonalcoholic fatty liver) 2/83/1517   OSA (obstructive sleep apnea) 02/24/2019   10/09/2018 - HST  - AHI 40.6    Pulmonary hypertension    Renal insufficiency    Managed by Dr. Wallace Keller   RLS (restless legs syndrome) 04/27/2015   Past Surgical History:  Procedure Laterality Date   BREAST REDUCTION SURGERY     EYE SURGERY Right    cateracts   HAMMER TOE SURGERY     LEFT HEART CATH AND CORONARY ANGIOGRAPHY N/A 02/03/2018   Procedure: LEFT HEART CATH AND CORONARY ANGIOGRAPHY;  Surgeon: Martinique, Peter M, MD;  Location: [facility];  Service: Cardiovascular;  Laterality: N/A;   REVERSE SHOULDER ARTHROPLASTY Right 08/19/2019   Procedure: REVERSE SHOULDER ARTHROPLASTY;  Surgeon: Netta Cedars, MD;  Location: [facility];  Service: Orthopedics;  Laterality: Right;  interscalene block   SHOULDER SURGERY Right    "I BROKE MY SHOUDLER   THIGH SURGERY     "TO REMOVE A TUMOR "    Social History:  reports that she has never smoked. She has never used smokeless tobacco. She reports previous alcohol use. She reports that she does not use drugs.   Allergies  Allergen Reactions   Ivp Dye [Iodinated Diagnostic Agents] Anaphylaxis and Swelling    Throat closes    Codeine Other (See Comments)   Iodine Other (See Comments)   Latex     Latex tape pulls skin with it    Family History  Problem Relation Age of Onset   Diabetes Mother    Heart disease Mother    Heart disease Brother    Heart disease Sister        CABG   Diabetes Sister    Alzheimer's disease Father    Alcohol abuse Sister    Mental illness Brother    Diabetes Brother      Prior to Admission medications   Medication Sig Start Date End Date Taking? Authorizing Provider  albuterol (PROVENTIL) (2.5 MG/3ML) 0.083% nebulizer solution Take 3 mLs (2.5 mg total) by nebulization every 6 (six)  hours as needed for wheezing or shortness of breath. 08/09/20  Yes Gerlene Fee, NP  albuterol (VENTOLIN HFA) 108 (90 Base) MCG/ACT inhaler Inhale 2 puffs into the lungs every 6 (six) hours as needed for wheezing or shortness of breath. 08/09/20  Yes Gerlene Fee, NP  apixaban (ELIQUIS) 5 MG TABS tablet Take 1 tablet (5 mg total) by mouth 2 (two) times daily. 08/09/20  Yes Gerlene Fee, NP  ARIPiprazole (ABILIFY) 10 MG tablet Take 3 tablets (30 mg total) by mouth daily. 08/09/20  Yes Gerlene Fee, NP  atorvastatin (LIPITOR) 40 MG tablet TAKE 1 TABLET DAILY 11/06/20  Yes Stacks, Cletus Gash, MD  carbamazepine (CARBATROL) 100 MG 12 hr capsule Take by mouth at bedtime. 08/22/20  Yes [provider]  denosumab (PROLIA) 60 MG/ML SOSY injection Inject 60 mg into the skin every 6 (six) months. 08/14/20  Yes [name], MD  diltiazem (CARDIZEM CD) 120 MG 24 hr capsule Take 1 capsule (120 mg total) by mouth at bedtime. 09/13/20  Yes Strader, [place], PA-C  escitalopram (LEXAPRO) 10 MG tablet Take 1 tablet (10 mg total) by mouth daily. Patient taking differently: Take 10 mg  by mouth 2 (two) times daily. 08/09/20  Yes Gerlene Fee, NP  flurazepam (DALMANE) 30 MG capsule Take 30 mg by mouth at bedtime as needed. 10/08/20  Yes [provider]  Fluticasone-Salmeterol (ADVAIR DISKUS) 500-50 MCG/DOSE AEPB Inhale 1 puff into the lungs in the morning and at bedtime. 08/09/20  Yes Gerlene Fee, NP  furosemide (LASIX) 20 MG tablet Take 0.5 tablets (10 mg total) by mouth daily. TAKE 1 TABLET EVERY MORNING 08/31/20  Yes Strader, [place], PA-C  Galcanezumab-gnlm [facility]) 120 MG/ML SOAJ Administer once a month starting 09/27/2020 10/01/20  Yes Cameron Sprang, MD  HYDROmorphone (DILAUDID) 2 MG tablet Take 1 tablet (2 mg total) by mouth every 6 (six) hours as needed for severe pain. 11/30/20  Yes Lovorn, Jinny Blossom, MD  ibuprofen (ADVIL) 800 MG tablet Take 1 tablet (800 mg total) by mouth every 8  (eight) hours as needed. 11/14/20  Yes Stacks, Cletus Gash, MD  icosapent Ethyl (VASCEPA) 1 g capsule TAKE 2 CAPSULES TWICE A DAY WITH MEALS 08/09/20  Yes Gerlene Fee, NP  insulin glargine (LANTUS SOLOSTAR) 100 UNIT/ML Solostar Pen Inject 45 Units into the skin 2 (two) times daily. 09/24/20  Yes Shamleffer, Melanie Crazier, MD  LORazepam (ATIVAN) 1 MG tablet Take 1 tablet (1 mg total) by mouth 2 (two) times daily. And give 2 tablets by mouth at bedtime 08/09/20  Yes Gerlene Fee, NP  meclizine (ANTIVERT) 12.5 MG tablet Take 1 tablet (12.5 mg total) by mouth 3 (three) times daily as needed for dizziness. 08/09/20  Yes Gerlene Fee, NP  metFORMIN (GLUCOPHAGE) 500 MG tablet Take 2 tablets (1,000 mg total) by mouth 2 (two) times daily with a meal. Patient taking differently: Take 500 mg by mouth 2 (two) times daily with a meal. 09/24/20  Yes Shamleffer, Melanie Crazier, MD  metoprolol succinate (TOPROL XL) 25 MG 24 hr tablet Take 1 tablet (25 mg total) by mouth in the morning and at bedtime. 08/31/20  Yes Strader, [place] M, PA-C  metoprolol succinate (TOPROL-XL) 100 MG 24 hr tablet Take 1 tablet (100 mg total) by mouth in the morning and at bedtime. 12/04/20  Yes Strader, [place] M, PA-C  polyethylene glycol (MIRALAX / GLYCOLAX) 17 g packet Take 17 g by mouth daily. 04/25/20  Yes Emokpae, Courage, MD  potassium chloride SA (KLOR-CON) 20 MEQ tablet Take 1 tablet (20 mEq total) by mouth daily. 08/09/20  Yes Gerlene Fee, NP  pregabalin (LYRICA) 200 MG capsule Take 1 capsule (200 mg total) by mouth 2 (two) times daily. 11/30/20  Yes Lovorn, Megan, MD  Semaglutide (RYBELSUS) 14 MG TABS Take 14 mg by mouth daily. 09/24/20  Yes Shamleffer, Melanie Crazier, MD  SUMAtriptan (IMITREX) 100 MG tablet Take 100 mg by mouth as directed. 11/01/20  Yes [provider]  tiZANidine (ZANAFLEX) 4 MG capsule TAKE 1 CAPSULE BY MOUTH THREE TIMES DAILY AS NEEDED FOR MUSCLE SPASMS Patient taking differently: Take 4 mg by  mouth 3 (three) times daily as needed for muscle spasms. 11/06/20  Yes [name], MD  traZODone (DESYREL) 100 MG tablet Take 400 mg by mouth at bedtime. 08/14/20  Yes [provider]  Vitamin D, Cholecalciferol, 25 MCG (1000 UT) TABS Take 1 tablet by mouth daily in the afternoon. 07/26/20  Yes [provider]  Janne Lab Oil (VENELEX) OINT Apply topically. Special Instructions: Apply to coccyx and bilateral buttocks q shift for prevention/ redness. Patient not taking: No sig reported    [provider]  cefdinir (OMNICEF) 300 MG capsule Take 1 capsule (300 mg total) by mouth 2 (two) times daily. 1 po BID Patient not taking: Reported on 12/13/2020 12/13/20   Dettinger, Fransisca Kaufmann, MD  Continuous Blood Gluc Sensor (DEXCOM G6 SENSOR) MISC 1 Device by Does not apply route as directed. 09/24/20   Shamleffer, Melanie Crazier, MD  Continuous Blood Gluc Transmit (DEXCOM G6 TRANSMITTER) MISC 1 Device by Does not apply route as directed. 09/24/20   Shamleffer, Melanie Crazier, MD  diphenoxylate-atropine (LOMOTIL) 2.5-0.025 MG tablet Take 2 tablets by mouth 4 (four) times daily as needed for diarrhea or loose stools. Patient not taking: No sig reported 10/26/20   Ivy Lynn, NP  fluconazole (DIFLUCAN) 150 MG tablet Take one tablet now. May repeat in 3 days if symptoms continue Patient not taking: No sig reported 11/01/20   Gwenlyn Perking, FNP  insulin lispro (HUMALOG KWIKPEN) 100 UNIT/ML KwikPen SSI changed by endocrine Patient not taking: No sig reported 11/14/20   [name], MD  neomycin-polymyxin-hydrocortisone (CORTISPORIN) OTIC solution Place 4 drops into the right ear 4 (four) times daily. Patient not taking: No sig reported 11/14/20   [name], MD  NON FORMULARY Diet: _____ Regular, ___  __ NAS, ___x____Consistent Carbohydrate, _______NPO _____Other 04/24/20   [provider]  Probiotic, Lactobacillus, CAPS Take 1 capsule by mouth  daily. Patient not taking: No sig reported 10/26/20   Ivy Lynn, NP  SUMAtriptan (IMITREX) 50 MG tablet TAKE 1 TABLET BY MOUTH AT ONSET OF HEADACHE. MAY REPEAT IN 2 HOURS IF HEADACHE PERSISTS OR REOCCURS. MAX TWO TABLETS PER 24 HOURS Patient not taking: No sig reported 12/12/20   [name], MD  temazepam (RESTORIL) 30 MG capsule Take 30 mg by mouth at bedtime as needed. Patient not taking: No sig reported 11/29/20   [provider]    Physical Exam: BP (!) 153/91   Pulse 77   Temp 97.8 F (36.6 C) (Oral)   Resp (!) 25   Ht 5\' 2"  (1.575 m)   Wt 90.7 kg   SpO2 99%   BMI 36.58 kg/m   General: 75 y.o. year-old female well developed well nourished in no acute distress.  Alert and oriented x3. HEENT: NCAT, EOMI Neck: Supple, trachea medial Cardiovascular: Regular rate and rhythm with no rubs or gallops.  No thyromegaly or JVD noted.  No lower extremity edema. 2/4 pulses in all 4 extremities. Respiratory: Intermittent tachypnea.  Bilateral Rales in the lower lobes on auscultation. Abdomen: Soft, increased abdominal girth, suprapubic tenderness to palpation. Muskuloskeletal: Trace edema in lower extremities.  No cyanosis or clubbing Neuro: CN II-XII intact, strength 5/5 x 4, sensation, reflexes intact Skin: No ulcerative lesions noted or rashes Psychiatry: Mood is appropriate for condition and setting          Labs on Admission:  Basic Metabolic Panel: Recent Labs  Lab 12/13/20 1735  NA 139  K 4.2  CL 105  CO2 26  GLUCOSE 111*  BUN 10  CREATININE 0.59  CALCIUM 9.0   Liver Function Tests: Recent Labs  Lab 12/13/20 1735  AST 16  ALT 12  ALKPHOS 89  BILITOT 0.8  PROT 7.1  ALBUMIN 3.8   Recent Labs  Lab 12/13/20 1735  LIPASE 28   No results for input(s): AMMONIA in the last 168 hours. CBC: Recent Labs  Lab 12/13/20 1735  WBC 11.2*  NEUTROABS 9.0*  HGB 12.2  HCT 38.1  MCV 95.5  PLT 157  Cardiac Enzymes: No results for input(s):  CKTOTAL, CKMB, CKMBINDEX, TROPONINI in the last 168 hours.  BNP (last 3 results) Recent Labs    12/13/20 1735  BNP 474.0*    ProBNP (last 3 results) No results for input(s): PROBNP in the last 8760 hours.  CBG: No results for input(s): GLUCAP in the last 168 hours.  Radiological Exams on Admission: CT ABDOMEN PELVIS WO CONTRAST  Result Date: 12/13/2020 CLINICAL DATA:  Flank pain, kidney stone suspected. EXAM: CT ABDOMEN AND PELVIS WITHOUT CONTRAST TECHNIQUE: Multidetector CT imaging of the abdomen and pelvis was performed following the standard protocol without IV contrast. COMPARISON:  CT December 26, 2019. FINDINGS: Lower chest: Tiny bilateral pleural effusions. Bilateral ground-glass interstitial opacities with multifocal nodular consolidations. Cardiomegaly with mitral annulus calcifications and left atrial dilation. Hepatobiliary: Stable subcentimeter hypodense hepatic lesion too small to accurately characterize. Gallbladder is unremarkable. No biliary ductal dilation. Pancreas: Within normal limits. Spleen: Within normal limits. Adrenals/Urinary Tract: Left adrenal thickening without nodularity by size criteria, favor hyperplasia. Right adrenal glands unremarkable. No hydronephrosis. No renal calculi, ureteral or bladder calculi visualized. Urinary bladder is unremarkable. Stomach/Bowel: Stomach is decompressed. No pathologic dilation of small bowel. The appendix and terminal ileum appear normal. Sigmoid diverticulosis without findings of acute diverticulitis. Vascular/Lymphatic: Aortic atherosclerosis without aneurysmal dilation. No pathologically enlarged abdominal or pelvic lymph nodes. Reproductive: Uterus and bilateral adnexa are unremarkable. Other: No abdominopelvic ascites. Multifocal areas of skin thickening and subcutaneous body wall edema including in the right gluteal subcutaneous soft tissues in the left anterior abdominal wall and flank., nonspecific recommend correlation with  direct visualization. Musculoskeletal: Multilevel degenerative changes spine. Similar mild wedging deformities of the T11, T12 and L2 vertebral bodies. No acute osseous abnormality. IMPRESSION: 1. No acute intra-abdominal or pelvic findings. Specifically, no evidence of obstructive uropathy. 2. Tiny bilateral pleural effusions with bilateral ground-glass interstitial opacities and multifocal nodular consolidations. Findings are concerning for multifocal pneumonia. 3. Multifocal areas of skin thickening and subcutaneous body wall edema including in the right gluteal subcutaneous soft tissues in the left anterior abdominal wall and flank. , nonspecific recommend correlation with direct visualization. 4. Sigmoid diverticulosis without findings of acute diverticulitis. 5. Cardiomegaly with mitral annulus calcifications and left atrial dilation, consider further evaluation with cardiac echo if not previously performed. 6.  Aortic Atherosclerosis (ICD10-I70.0). Electronically Signed   By: Dahlia Bailiff MD   On: 12/13/2020 20:24   DG Chest Port 1 View  Result Date: 12/13/2020 CLINICAL DATA:  Shortness of breath and right flank pain. Atrial fibrillation. Cardiomyopathy. EXAM: PORTABLE CHEST 1 VIEW COMPARISON:  09/17/2020 FINDINGS: Patient rotated to the left. Right shoulder arthroplasty. Moderate cardiomegaly. No right-sided pleural effusion. No pneumothorax. Mild pulmonary interstitial prominence and indistinctness. The left lung base is not well evaluated secondary to overlying soft tissues and patient body habitus. IMPRESSION: Cardiomegaly and mild pulmonary venous congestion. Suboptimal evaluation of the left lung base. Electronically Signed   By: Abigail Miyamoto M.D.   On: 12/13/2020 18:41    EKG: I independently viewed the EKG done and my findings are as followed: A. fib with RVR with VPCs and borderline prolonged QTc (490 ms)  Assessment/Plan Present on Admission:  Hyperlipidemia  Chronic back pain  Chronic  atrial fibrillation (HCC)  CAD (coronary artery disease)  Principal Problem:   Acute exacerbation of CHF (congestive heart failure) (HCC) Active Problems:   Type 2 diabetes mellitus (HCC)   Hyperlipidemia   Chronic back pain   Chronic atrial fibrillation ([place])   Bipolar  disorder [facility])   Essential hypertension   Atrial fibrillation with RVR (HCC)   Obesity (BMI 30-39.9)   Acute on chronic respiratory failure with hypoxia (HCC)   CAD (coronary artery disease)   UTI (urinary tract infection)   Elevated brain natriuretic peptide (BNP) level   Elevated troponin  Acute on chronic respiratory failure with hypoxia secondary to acute exacerbation of CHF Elevated BNP (474) Chest x-ray showed mild pulmonary venous congestion Patient stopped taking her diuretics about a week ago due to increased urination Continue total input/output, daily weights and fluid restriction IV Lasix 60 mg x 1 was given with an output of 400 cc of urine at bedside Continue IV Lasix 40 mg twice daily Continue Cardiac diet  Echocardiogram done on 01/22/2019 showed LVEF of 55 to 60%.  Echocardiogram will be done in the morning   UTI POA Patient complained of suprapubic pain with increased urinary frequency and urgency She was started on IV ceftriaxone, we shall continue same at this time Urine culture pending  Questionable multifocal pneumonia CT imaging was suggestive of multifocal pneumonia Patient complained of dry cough, but denies fever, chest congestion.  WBC is only 11.2 (which may be reactive) Procalcitonin will be checked prior to complete total pneumonia treatment  Elevated troponin possibly secondary to type II demand ischemia Troponin has flattened out (30 > 26)  T2DM (diabetes mellitus, type 2)  Last hemoglobin A 1C done on 11/14/2020 was 6.8 Continue ISS and hypoglycemia protocol Metformin will be held at this time  Hyperlipidemia Continue home atorvastatin    Chronic back pain Continue  Tylenol as needed  Chronic atrial fibrillation CHADS-VASc Score of at least 5. Continue Eliquis. Continue Cardizem and metoprolol.  Bipolar disorder Continue Abilify, Carbatrol and lorazepam. Resume after med rec done.  History of obstructive sleep apnea Patient states that she does not use CPAP at night  Mild intermittent asthma Supplemental oxygen as needed. Bronchodilators as needed   CAD (coronary artery disease) Continue Eliquis, atorvastatin and metoprolol   Essential hypertension Continue metoprolol, Cardizem Monitor BP.   Obesity (BMI 36.58) Patient will be counseled on diet and lifestyle modifications. Patient will need outpatient follow-up with PCP for weight loss program  DVT prophylaxis: Eliquis.  Code Status: Full code.  Family Communication: None at bedside  Disposition Plan:  Patient is from:                        home Anticipated DC to:                   SNF or family members home Anticipated DC date:               2-3 days Anticipated DC barriers:          Patient requires inpatient management due to worsening hypoxia secondary to CHF exacerbation which requires inpatient management   Consults called: None  Admission status: Inpatient    Bernadette Hoit MD [facility]  12/14/2020, 12:45 AM

## 2020-12-13 NOTE — ED Notes (Signed)
Patient states that she is unable to sign consent form for MSE but does consent for tx.

## 2020-12-13 NOTE — Progress Notes (Signed)
Virtual Visit via telephone Note  I connected with Amber Stephenson on 12/13/20 at 1336 by telephone and verified that I am speaking with the correct person using two identifiers. Amber Stephenson is currently located at home and patient are currently with her during visit. The provider, Fransisca Kaufmann Kember Boch, MD is located in their office at time of visit.  Call ended at 1343  I discussed the limitations, risks, security and privacy concerns of performing an evaluation and management service by telephone and the availability of in person appointments. I also discussed with the patient that there may be a patient responsible charge related to this service. The patient expressed understanding and agreed to proceed.   History and Present Illness: Patient is calling in for frequency and urgency and it has been going on for 2 days and worsening.  She has not taking anything OTC.  She is having pain in her right side that started last night. She denies hematuria.    Review of Systems  Constitutional:  Negative for chills and fever.  Eyes:  Negative for visual disturbance.  Respiratory:  Negative for chest tightness and shortness of breath.   Cardiovascular:  Negative for chest pain and leg swelling.  Gastrointestinal:  Negative for abdominal pain.  Genitourinary:  Positive for frequency and urgency. Negative for difficulty urinating, dysuria, flank pain, hematuria, vaginal bleeding, vaginal discharge and vaginal pain.  Musculoskeletal:  Negative for back pain and gait problem.  Skin:  Negative for rash.  Neurological:  Negative for light-headedness and headaches.  Psychiatric/Behavioral:  Negative for agitation and behavioral problems.   All other systems reviewed and are negative.  Observations/Objective: Patient sounds like she is having some wheezing and breathing difficulties, she does admit she has CHF and has not been taking her diuretic.  Assessment and Plan: Problem List Items  Addressed This Visit   None Visit Diagnoses     UTI symptoms    -  Primary   Relevant Medications   cefdinir (OMNICEF) 300 MG capsule   Other Relevant Orders   Urinalysis, Complete   Urine Culture   Acute on chronic congestive heart failure, unspecified heart failure type ([place])           Will treat for UTI based on symptoms, she did sound like she was having some breathing issues with her CHF because she had stopped her diuretic because of the urinary frequency and burning.  Recommended to go ahead and take a diuretic right now and start the antibiotic and if her breathing does not improve to go to the hospital. Follow up plan: Return if symptoms worsen or fail to improve.     I discussed the assessment and treatment plan with the patient. The patient was provided an opportunity to ask questions and all were answered. The patient agreed with the plan and demonstrated an understanding of the instructions.   The patient was advised to call back or seek an in-person evaluation if the symptoms worsen or if the condition fails to improve as anticipated.  The above assessment and management plan was discussed with the patient. The patient verbalized understanding of and has agreed to the management plan. Patient is aware to call the clinic if symptoms persist or worsen. Patient is aware when to return to the clinic for a follow-up visit. Patient educated on when it is appropriate to go to the emergency department.    I provided 7 minutes of non-face-to-face time during this encounter.  Worthy Rancher, MD

## 2020-12-14 ENCOUNTER — Other Ambulatory Visit (HOSPITAL_COMMUNITY): Payer: Self-pay | Admitting: *Deleted

## 2020-12-14 ENCOUNTER — Inpatient Hospital Stay (HOSPITAL_COMMUNITY): Payer: Medicare Other

## 2020-12-14 DIAGNOSIS — R0609 Other forms of dyspnea: Secondary | ICD-10-CM

## 2020-12-14 DIAGNOSIS — R778 Other specified abnormalities of plasma proteins: Secondary | ICD-10-CM

## 2020-12-14 DIAGNOSIS — N390 Urinary tract infection, site not specified: Secondary | ICD-10-CM | POA: Diagnosis present

## 2020-12-14 DIAGNOSIS — R7989 Other specified abnormal findings of blood chemistry: Secondary | ICD-10-CM

## 2020-12-14 DIAGNOSIS — F3112 Bipolar disorder, current episode manic without psychotic features, moderate: Secondary | ICD-10-CM

## 2020-12-14 DIAGNOSIS — E1111 Type 2 diabetes mellitus with ketoacidosis with coma: Secondary | ICD-10-CM

## 2020-12-14 HISTORY — DX: Urinary tract infection, site not specified: N39.0

## 2020-12-14 HISTORY — DX: Other specified abnormal findings of blood chemistry: R79.89

## 2020-12-14 LAB — GLUCOSE, CAPILLARY
Glucose-Capillary: 145 mg/dL — ABNORMAL HIGH (ref 70–99)
Glucose-Capillary: 96 mg/dL (ref 70–99)
Glucose-Capillary: 131 mg/dL — ABNORMAL HIGH (ref 70–99)
Glucose-Capillary: 160 mg/dL — ABNORMAL HIGH (ref 70–99)
Glucose-Capillary: 214 mg/dL — ABNORMAL HIGH (ref 70–99)

## 2020-12-14 LAB — COMPREHENSIVE METABOLIC PANEL
ALT: 12 U/L (ref 0–44)
AST: 13 U/L — ABNORMAL LOW (ref 15–41)
Albumin: 3.6 g/dL (ref 3.5–5.0)
Alkaline Phosphatase: 88 U/L (ref 38–126)
Anion gap: 11 (ref 5–15)
BUN: 13 mg/dL (ref 8–23)
CO2: 33 mmol/L — ABNORMAL HIGH (ref 22–32)
Calcium: 9.3 mg/dL (ref 8.9–10.3)
Chloride: 97 mmol/L — ABNORMAL LOW (ref 98–111)
Creatinine, Ser: 0.81 mg/dL (ref 0.44–1.00)
GFR, Estimated: >60 mL/min (ref >60)
Glucose, Bld: 103 mg/dL — ABNORMAL HIGH (ref 70–99)
Potassium: 3.9 mmol/L (ref 3.5–5.1)
Sodium: 141 mmol/L (ref 135–145)
Total Bilirubin: 0.8 mg/dL (ref 0.3–1.2)
Total Protein: 7.1 g/dL (ref 6.5–8.1)

## 2020-12-14 LAB — ECHOCARDIOGRAM COMPLETE
AR max vel: 2.44 cm2
AV Area VTI: 2.7 cm2
AV Area mean vel: 2.87 cm2
AV Mean grad: 2 mmHg
AV Peak grad: 6 mmHg
Ao pk vel: 1.22 m/s
Area-P 1/2: 4.06 cm2
Height: 62 in
MV VTI: 1.76 cm2
S' Lateral: 2.86 cm
Weight: 3188.73 oz

## 2020-12-14 LAB — PROCALCITONIN: Procalcitonin: <0.1 ng/mL

## 2020-12-14 LAB — HEMOGLOBIN A1C
Hgb A1c MFr Bld: 6.8 % — ABNORMAL HIGH (ref 4.8–5.6)
Mean Plasma Glucose: 148.46 mg/dL

## 2020-12-14 LAB — CBC
HCT: 39.8 % (ref 36.0–46.0)
Hemoglobin: 12.7 g/dL (ref 12.0–15.0)
MCH: 29.8 pg (ref 26.0–34.0)
MCHC: 31.9 g/dL (ref 30.0–36.0)
MCV: 93.4 fL (ref 80.0–100.0)
Platelets: 163 10*3/uL (ref 150–400)
RBC: 4.26 MIL/uL (ref 3.87–5.11)
RDW: 15.3 % (ref 11.5–15.5)
WBC: 9.8 10*3/uL (ref 4.0–10.5)
nRBC: 0 % (ref 0.0–0.2)

## 2020-12-14 LAB — MAGNESIUM: Magnesium: 1.7 mg/dL (ref 1.7–2.4)

## 2020-12-14 LAB — APTT: aPTT: 36 seconds (ref 24–36)

## 2020-12-14 LAB — PROTIME-INR
INR: 1.6 — ABNORMAL HIGH (ref 0.8–1.2)
Prothrombin Time: 18.8 seconds — ABNORMAL HIGH (ref 11.4–15.2)

## 2020-12-14 LAB — PHOSPHORUS: Phosphorus: 5.5 mg/dL — ABNORMAL HIGH (ref 2.5–4.6)

## 2020-12-14 LAB — MRSA NEXT GEN BY PCR, NASAL: MRSA by PCR Next Gen: NOT DETECTED

## 2020-12-14 MED ORDER — ARIPIPRAZOLE 10 MG PO TABS
30.0000 mg | ORAL_TABLET | Freq: Every day | ORAL | Status: DC
  Administered 2020-12-14 – 2020-12-16 (×3): 30 mg via ORAL
  Filled 2020-12-14 (×3): qty 3

## 2020-12-14 MED ORDER — DILTIAZEM HCL ER COATED BEADS 120 MG PO CP24
120.0000 mg | ORAL_CAPSULE | Freq: Every day | ORAL | Status: DC
  Administered 2020-12-14 – 2020-12-15 (×2): 120 mg via ORAL
  Filled 2020-12-14 (×2): qty 1

## 2020-12-14 MED ORDER — OXYCODONE HCL 5 MG PO TABS
5.0000 mg | ORAL_TABLET | Freq: Once | ORAL | Status: AC
Start: 2020-12-14 — End: 2020-12-14
  Administered 2020-12-14: 5 mg via ORAL
  Filled 2020-12-14: qty 1

## 2020-12-14 MED ORDER — MOMETASONE FURO-FORMOTEROL FUM 200-5 MCG/ACT IN AERO
2.0000 | INHALATION_SPRAY | Freq: Two times a day (BID) | RESPIRATORY_TRACT | Status: DC
  Administered 2020-12-14 (×2): 2 via RESPIRATORY_TRACT
  Filled 2020-12-14: qty 8.8

## 2020-12-14 MED ORDER — HYDROMORPHONE HCL 2 MG PO TABS
1.0000 mg | ORAL_TABLET | Freq: Four times a day (QID) | ORAL | Status: DC | PRN
  Administered 2020-12-14 – 2020-12-16 (×7): 1 mg via ORAL
  Filled 2020-12-14 (×7): qty 1

## 2020-12-14 MED ORDER — METOPROLOL SUCCINATE ER 25 MG PO TB24
25.0000 mg | ORAL_TABLET | Freq: Every day | ORAL | Status: DC
  Administered 2020-12-14 – 2020-12-16 (×3): 25 mg via ORAL
  Filled 2020-12-14 (×3): qty 1

## 2020-12-14 MED ORDER — SODIUM CHLORIDE 0.9 % IV SOLN
1.0000 g | INTRAVENOUS | Status: AC
  Administered 2020-12-14 – 2020-12-15 (×2): 1 g via INTRAVENOUS
  Filled 2020-12-14 (×2): qty 10

## 2020-12-14 MED ORDER — ATORVASTATIN CALCIUM 40 MG PO TABS
40.0000 mg | ORAL_TABLET | Freq: Every day | ORAL | Status: DC
  Administered 2020-12-14 – 2020-12-16 (×3): 40 mg via ORAL
  Filled 2020-12-14 (×3): qty 1

## 2020-12-14 MED ORDER — FUROSEMIDE 10 MG/ML IJ SOLN
40.0000 mg | Freq: Two times a day (BID) | INTRAMUSCULAR | Status: DC
  Administered 2020-12-14 – 2020-12-16 (×5): 40 mg via INTRAVENOUS
  Filled 2020-12-14 (×5): qty 4

## 2020-12-14 MED ORDER — CHLORHEXIDINE GLUCONATE CLOTH 2 % EX PADS
6.0000 | MEDICATED_PAD | Freq: Every day | CUTANEOUS | Status: DC
  Administered 2020-12-14: 6 via TOPICAL

## 2020-12-14 MED ORDER — APIXABAN 5 MG PO TABS
5.0000 mg | ORAL_TABLET | Freq: Two times a day (BID) | ORAL | Status: DC
  Administered 2020-12-14 – 2020-12-16 (×6): 5 mg via ORAL
  Filled 2020-12-14 (×6): qty 1

## 2020-12-14 MED ORDER — ALBUTEROL SULFATE (2.5 MG/3ML) 0.083% IN NEBU: 3.0000 mL | INHALATION_SOLUTION | Freq: Four times a day (QID) | RESPIRATORY_TRACT | Status: DC | PRN

## 2020-12-14 MED ORDER — CARBAMAZEPINE ER 100 MG PO TB12
100.0000 mg | ORAL_TABLET | Freq: Two times a day (BID) | ORAL | Status: DC
  Administered 2020-12-14 – 2020-12-16 (×6): 100 mg via ORAL
  Filled 2020-12-14 (×12): qty 1

## 2020-12-14 MED ORDER — INSULIN ASPART 100 UNIT/ML IJ SOLN
0.0000 [IU] | Freq: Three times a day (TID) | INTRAMUSCULAR | Status: DC
  Administered 2020-12-14: 3 [IU] via SUBCUTANEOUS
  Administered 2020-12-14: 2 [IU] via SUBCUTANEOUS
  Administered 2020-12-15 (×2): 5 [IU] via SUBCUTANEOUS
  Administered 2020-12-15: 3 [IU] via SUBCUTANEOUS
  Administered 2020-12-16: 8 [IU] via SUBCUTANEOUS
  Administered 2020-12-16: 5 [IU] via SUBCUTANEOUS

## 2020-12-14 MED ORDER — INSULIN ASPART 100 UNIT/ML IJ SOLN
0.0000 [IU] | Freq: Every day | INTRAMUSCULAR | Status: DC
  Administered 2020-12-14: 2 [IU] via SUBCUTANEOUS

## 2020-12-14 MED ORDER — LORAZEPAM 1 MG PO TABS
1.0000 mg | ORAL_TABLET | Freq: Two times a day (BID) | ORAL | Status: DC
  Administered 2020-12-14 – 2020-12-16 (×6): 1 mg via ORAL
  Filled 2020-12-14 (×6): qty 1

## 2020-12-14 MED ORDER — MELATONIN 3 MG PO TABS
6.0000 mg | ORAL_TABLET | Freq: Once | ORAL | Status: AC
  Administered 2020-12-14: 6 mg via ORAL
  Filled 2020-12-14: qty 2

## 2020-12-14 NOTE — Care Management Important Message (Signed)
Important Message  Patient Details  Name: Amber Stephenson MRN: [id number] Date of Birth: 1946-01-09   Medicare Important Message Given:  Yes     Tommy Medal 12/14/2020, 4:07 PM

## 2020-12-14 NOTE — Progress Notes (Signed)
Initial Nutrition Assessment  DOCUMENTATION CODES:   Morbid obesity  INTERVENTION:  Heart Healthy/CHO modified diet   Nutrition education  NUTRITION DIAGNOSIS:   Limited adherence to nutrition-related recommendations related to social / environmental circumstances as evidenced by per patient/family report.   GOAL:   (Patient will comply with Heart Healthy /CHO modifed diet)  MONITOR:  PO intake, Weight trends, Labs, I & O's  REASON FOR ASSESSMENT:   Malnutrition Screening Tool    ASSESSMENT: patient is a 75 yo female with hx CHF, CAD, DM, Renal insufficiency and hypertension. Presents with shortness of breath.   Patient reports 10 lb wt gain above usual of 190 lb. Noted non-compliance with diuretic medication. Pitting edema bilateral lower extremities.   Denies change in eating routine. Husband is bedside and says they usually eat a "good" breakfast and dinner and a light lunch (sandwich). He prepares their food and sometimes adds salt when cooking. They use NuSalt and Lite salt at times. Encouraged low sodium diet at home and using a salt substitute or herbs to flavor foods.  Medications reviewed and include: Lasix, Insulin.   Labs:  BMP Latest Ref Rng & Units 12/14/2020 12/13/2020 11/14/2020  Glucose 70 - 99 mg/dL 103(H) 111(H) 125(H)  BUN 8 - 23 mg/dL 13 10 11   Creatinine 0.44 - 1.00 mg/dL 0.81 0.59 0.71  BUN/Creat Ratio 12 - 28 - - 15  Sodium 135 - 145 mmol/L 141 139 142  Potassium 3.5 - 5.1 mmol/L 3.9 4.2 4.5  Chloride 98 - 111 mmol/L 97(L) 105 103  CO2 22 - 32 mmol/L 33(H) 26 22  Calcium 8.9 - 10.3 mg/dL 9.3 9.0 9.4      NUTRITION - FOCUSED PHYSICAL EXAM:   Unable to complete Nutrition-Focused physical exam at this time.     Diet Order:   Diet Order             Diet Heart Room service appropriate? Yes; Fluid consistency: Thin  Diet effective now                   EDUCATION NEEDS: addressed  Skin:  Skin Assessment: Reviewed RN Assessment  Last  BM:  7/15  Height:   Ht Readings from Last 1 Encounters:  12/14/20 5\' 2"  (1.575 m)    Weight:   Wt Readings from Last 1 Encounters:  12/14/20 90.4 kg    Ideal Body Weight:   50 kg  BMI:  Body mass index is 36.45 kg/m.  Estimated Nutritional Needs:   Kcal:  1600-1700  Protein:  75-80 gr  Fluid:  < 2 liters daily   Colman Cater MS,RD,CSG,LDN Contact: Shea Evans

## 2020-12-14 NOTE — Plan of Care (Signed)

## 2020-12-14 NOTE — Progress Notes (Signed)
  Echocardiogram 2D Echocardiogram has been performed.  Amber Stephenson 12/14/2020, 2:11 PM

## 2020-12-14 NOTE — Progress Notes (Signed)
Patient Demographics:    Amber Stephenson, is a 75 y.o. female, DOB - 25-Nov-1945, OZD:[id number]  Admit date - 12/13/2020   Admitting Physician Amber Hoit, DO  Outpatient Primary MD for the patient is Amber Fraise, MD  LOS - 1   Chief Complaint  Patient presents with   Flank Pain        Subjective:    Amber Stephenson today has no fevers, no emesis,  No chest pain,   -Shortness of breath and hypoxia persist  Assessment  & Plan :    Principal Problem:   Acute exacerbation of CHF (congestive heart failure) ([place]) Active Problems:   Chronic atrial fibrillation ([facility])   CAD (coronary artery disease)   Type 2 diabetes mellitus ([place])   Hyperlipidemia   Chronic back pain   Bipolar disorder ([place])   Essential hypertension   Atrial fibrillation with RVR ([place])   Obesity (BMI 30-39.9)   Acute on chronic respiratory failure with hypoxia ([facility])   UTI (urinary tract infection)   Elevated brain natriuretic peptide (BNP) level   Elevated troponin  Brief Summary:- 75 y.o. female with medical history significant for  chronic atrial fibrillation on Eliquis, bipolar 1 disorder, major depressive disorder, nonobstructive CAD, chronic diastolic CHF, hypertension, type 2 diabetes, diabetic neuropathy, chronic lower back pain, hyperlipidemia, hypoxia, insomnia, migraine headaches, nonalcoholic asteatotic hepatitis, OSA, pulmonary hypertension, restless leg syndrome admitted on 12/13/2020 with CHF exacerbation  A/p 1) acute hypoxic respiratory failure secondary to CHF exacerbation -Elevated BNP noted chest x-ray consistent with CHF -Patient quit diuretics a week ago -Continue IV Lasix, fluid input and output monitoring Echo from 12/14/20---- EF is 65 % to 70%, with severely dilated Lt and Rt Atrial -Currently requiring 4 L of oxygen via nasal cannula, PTA was not on home O2  2)Possible PNA-CT suggest possible  multifocal pneumonia, PCT less than 0.10 -WBC 11.2, no fevers -Favor CHF over pneumonia -Repeat chest x-ray after diuresis -Currently on IV Rocephin for possible UTI  3)UTI-----IV Rocephin pending further culture data  4)DM2-A1c 6.8 reflecting good diabetic control PTA --Hold metformin Use Novolog/Humalog Sliding scale insulin with Accu-Cheks/Fingersticks as ordered   5) bipolar disorder--- okay to resume Abilify and lorazepam  6)CAD--- troponin noted, not consistent with ACS, -Continue Lipitor and metoprolol  7) chronic atrial fibrillation--continue Eliquis for stroke prophylaxis on metoprolol and Cardizem for rate control  8)chronic back pain--- PTA patient was on Dilaudid, resume  Disposition/Need for in-Hospital Stay- patient unable to be discharged at this time due to hypoxic respiratory failure requiring IV diuresis*  Status is: Inpatient  Remains inpatient appropriate because: Please see disposition above  Disposition: The patient is from: Home              Anticipated d/c is to: Home              Anticipated d/c date is: 2 days              Patient currently is not medically stable to d/c. Barriers: Not Clinically Stable-   Code Status :  -  Code Status: Full Code   Family Communication:    NA (patient is alert, awake and coherent)   Consults  :  na  DVT Prophylaxis  :   -  SCDs  SCDs Start: 12/13/20 2147 apixaban (ELIQUIS) tablet 5 mg    Lab Results  Component Value Date   PLT 163 12/14/2020    Inpatient Medications  Scheduled Meds:  apixaban  5 mg Oral BID   ARIPiprazole  30 mg Oral Daily   atorvastatin  40 mg Oral Daily   carbamazepine  100 mg Oral BID   Chlorhexidine Gluconate Cloth  6 each Topical Daily   diltiazem  120 mg Oral QHS   furosemide  40 mg Intravenous Q12H   insulin aspart  0-15 Units Subcutaneous TID WC   insulin aspart  0-5 Units Subcutaneous QHS   LORazepam  1 mg Oral BID   metoprolol succinate  25 mg Oral Daily    mometasone-formoterol  2 puff Inhalation BID   Continuous Infusions:  cefTRIAXone (ROCEPHIN)  IV 1 g (12/14/20 1042)   PRN Meds:.acetaminophen, albuterol, HYDROmorphone    Anti-infectives (From admission, onward)    Start     Dose/Rate Route Frequency Ordered Stop   12/14/20 1000  cefTRIAXone (ROCEPHIN) 1 g in sodium chloride 0.9 % 100 mL IVPB        1 g 200 mL/hr over 30 Minutes Intravenous Every 24 hours 12/14/20 0052 12/16/20 0959   12/13/20 2200  cefTRIAXone (ROCEPHIN) 1 g in sodium chloride 0.9 % 100 mL IVPB        1 g 200 mL/hr over 30 Minutes Intravenous  Once 12/13/20 2151 12/13/20 2249         Objective:   Vitals:   12/14/20 0500 12/14/20 0600 12/14/20 0747 12/14/20 1129  BP: 98/69 109/82    Pulse: 77 87    Resp: (!) 21 20    Temp:   98.5 F (36.9 C) 98.6 F (37 C)  TempSrc:   Oral Oral  SpO2: 97% 98%    Weight:      Height:        Wt Readings from Last 3 Encounters:  12/14/20 90.4 kg  12/12/20 93.9 kg  11/30/20 89.8 kg     Intake/Output Summary (Last 24 hours) at 12/14/2020 1737 Last data filed at 12/14/2020 0600 Gross per 24 hour  Intake 94.67 ml  Output 4100 ml  Net -4005.33 ml     Physical Exam  Gen:- Awake Alert, conversational dyspnea HEENT:- [place].AT, No sclera icterus Nose- [place] 4L/min Neck-Supple Neck,No JVD,.  Lungs-diminished breath sounds, no wheezing  CV- S1, S2 normal, irregular  Abd-  +ve B.Sounds, Abd Soft, No tenderness,    Extremity/Skin:- trace  edema, pedal pulses present  Psych-affect is appropriate, oriented x3 Neuro-generalized weakness, no new focal deficits, no tremors   Data Review:   Micro Results Recent Results (from the past 240 hour(s))  Microscopic Examination     Status: Abnormal   Collection Time: 12/13/20  1:39 PM   Urine  Result Value Ref Range Status   WBC, UA None seen 0 - 5 /hpf Final   RBC None seen 0 - 2 /hpf Final   Epithelial Cells (non renal) 0-10 0 - 10 /hpf Final   Bacteria, UA Few (A) None  seen/Few Final  MRSA Next Gen by PCR, Nasal     Status: None   Collection Time: 12/14/20  1:39 AM   Specimen: Nasal Mucosa; Nasal Swab  Result Value Ref Range Status   MRSA by PCR Next Gen NOT DETECTED NOT DETECTED Final    Comment: (NOTE) The GeneXpert MRSA Assay (FDA approved for NASAL specimens only), is one component of  a comprehensive MRSA colonization surveillance program. It is not intended to diagnose MRSA infection nor to guide or monitor treatment for MRSA infections. Test performance is not FDA approved in patients less than 59 years old. Performed at [facility], [address], [place], [place] [address]     Radiology Reports CT ABDOMEN PELVIS WO CONTRAST  Result Date: 12/13/2020 CLINICAL DATA:  Flank pain, kidney stone suspected. EXAM: CT ABDOMEN AND PELVIS WITHOUT CONTRAST TECHNIQUE: Multidetector CT imaging of the abdomen and pelvis was performed following the standard protocol without IV contrast. COMPARISON:  CT December 26, 2019. FINDINGS: Lower chest: Tiny bilateral pleural effusions. Bilateral ground-glass interstitial opacities with multifocal nodular consolidations. Cardiomegaly with mitral annulus calcifications and left atrial dilation. Hepatobiliary: Stable subcentimeter hypodense hepatic lesion too small to accurately characterize. Gallbladder is unremarkable. No biliary ductal dilation. Pancreas: Within normal limits. Spleen: Within normal limits. Adrenals/Urinary Tract: Left adrenal thickening without nodularity by size criteria, favor hyperplasia. Right adrenal glands unremarkable. No hydronephrosis. No renal calculi, ureteral or bladder calculi visualized. Urinary bladder is unremarkable. Stomach/Bowel: Stomach is decompressed. No pathologic dilation of small bowel. The appendix and terminal ileum appear normal. Sigmoid diverticulosis without findings of acute diverticulitis. Vascular/Lymphatic: Aortic atherosclerosis without aneurysmal dilation. No pathologically  enlarged abdominal or pelvic lymph nodes. Reproductive: Uterus and bilateral adnexa are unremarkable. Other: No abdominopelvic ascites. Multifocal areas of skin thickening and subcutaneous body wall edema including in the right gluteal subcutaneous soft tissues in the left anterior abdominal wall and flank., nonspecific recommend correlation with direct visualization. Musculoskeletal: Multilevel degenerative changes spine. Similar mild wedging deformities of the T11, T12 and L2 vertebral bodies. No acute osseous abnormality. IMPRESSION: 1. No acute intra-abdominal or pelvic findings. Specifically, no evidence of obstructive uropathy. 2. Tiny bilateral pleural effusions with bilateral ground-glass interstitial opacities and multifocal nodular consolidations. Findings are concerning for multifocal pneumonia. 3. Multifocal areas of skin thickening and subcutaneous body wall edema including in the right gluteal subcutaneous soft tissues in the left anterior abdominal wall and flank. , nonspecific recommend correlation with direct visualization. 4. Sigmoid diverticulosis without findings of acute diverticulitis. 5. Cardiomegaly with mitral annulus calcifications and left atrial dilation, consider further evaluation with cardiac echo if not previously performed. 6.  Aortic Atherosclerosis (ICD10-I70.0). Electronically Signed   By: Dahlia Bailiff MD   On: 12/13/2020 20:24   DG Chest Port 1 View  Result Date: 12/13/2020 CLINICAL DATA:  Shortness of breath and right flank pain. Atrial fibrillation. Cardiomyopathy. EXAM: PORTABLE CHEST 1 VIEW COMPARISON:  09/17/2020 FINDINGS: Patient rotated to the left. Right shoulder arthroplasty. Moderate cardiomegaly. No right-sided pleural effusion. No pneumothorax. Mild pulmonary interstitial prominence and indistinctness. The left lung base is not well evaluated secondary to overlying soft tissues and patient body habitus. IMPRESSION: Cardiomegaly and mild pulmonary venous  congestion. Suboptimal evaluation of the left lung base. Electronically Signed   By: Abigail Miyamoto M.D.   On: 12/13/2020 18:41   ECHOCARDIOGRAM COMPLETE  Result Date: 12/14/2020    ECHOCARDIOGRAM REPORT   Patient Name:   CHEYRL BULEY Fawley Date of Exam: 12/14/2020 Medical Rec #:  [id number]         Height:       62.0 in Accession #:    [id number]        Weight:       199.3 lb Date of Birth:  [birth year]          BSA:          1.909 m Patient Age:  75 years          BP:           109/82 mmHg Patient Gender: F                 HR:           80 bpm. Exam Location:  [facility] Procedure: 2D Echo, Cardiac Doppler and Color Doppler Indications:    Dyspnea  History:        Patient has prior history of Echocardiogram examinations, most                 recent 01/22/2019. CHF, CAD, Arrythmias:Atrial Fibrillation; Risk                 Factors:Hypertension, Diabetes and Dyslipidemia.  Sonographer:    Wenda Low Referring Phys: [place]  1. Left ventricular ejection fraction, by estimation, is 65 to 70%. The left ventricle has normal function. The left ventricle has no regional wall motion abnormalities. There is moderate left ventricular hypertrophy. Left ventricular diastolic parameters are indeterminate.  2. Right ventricular systolic function is low normal. The right ventricular size is mildly enlarged. There is normal pulmonary artery systolic pressure.  3. Left atrial size was severely dilated.  4. Right atrial size was severely dilated.  5. The mitral valve is abnormal. Trivial mitral valve regurgitation. No evidence of mitral stenosis. Moderate mitral annular calcification.  6. The aortic valve was not well visualized. Aortic valve regurgitation is not visualized. No aortic stenosis is present.  7. The inferior vena cava is dilated in size with >50% respiratory variability, suggesting right atrial pressure of 8 mmHg. FINDINGS  Left Ventricle: Left ventricular ejection fraction, by  estimation, is 65 to 70%. The left ventricle has normal function. The left ventricle has no regional wall motion abnormalities. The left ventricular internal cavity size was normal in size. There is  moderate left ventricular hypertrophy. Left ventricular diastolic parameters are indeterminate. Right Ventricle: The right ventricular size is mildly enlarged. Right vetricular wall thickness was not assessed. Right ventricular systolic function is low normal. There is normal pulmonary artery systolic pressure. The tricuspid regurgitant velocity is  2.32 m/s, and with an assumed right atrial pressure of 8 mmHg, the estimated right ventricular systolic pressure is 46.5 mmHg. Left Atrium: Left atrial size was severely dilated. Right Atrium: Right atrial size was severely dilated. Pericardium: There is no evidence of pericardial effusion. Mitral Valve: The mitral valve is abnormal. There is mild thickening of the mitral valve leaflet(s). There is mild calcification of the mitral valve leaflet(s). Moderate mitral annular calcification. Trivial mitral valve regurgitation. No evidence of mitral valve stenosis. MV peak gradient, 9.6 mmHg. The mean mitral valve gradient is 3.0 mmHg. Tricuspid Valve: The tricuspid valve is not well visualized. Tricuspid valve regurgitation is mild . No evidence of tricuspid stenosis. Aortic Valve: The aortic valve was not well visualized. Aortic valve regurgitation is not visualized. No aortic stenosis is present. Aortic valve mean gradient measures 2.0 mmHg. Aortic valve peak gradient measures 6.0 mmHg. Aortic valve area, by VTI measures 2.70 cm. Pulmonic Valve: The pulmonic valve was not well visualized. Pulmonic valve regurgitation is not visualized. No evidence of pulmonic stenosis. Aorta: The aortic root is normal in size and structure. Venous: The inferior vena cava is dilated in size with greater than 50% respiratory variability, suggesting right atrial pressure of 8 mmHg. IAS/Shunts: No  atrial level shunt detected by color flow Doppler.  LEFT VENTRICLE  PLAX 2D LVIDd:         4.46 cm LVIDs:         2.86 cm LV PW:         1.30 cm LV IVS:        1.30 cm LVOT diam:     2.10 cm LV SV:         57 LV SV Index:   30 LVOT Area:     3.46 cm  RIGHT VENTRICLE RV Basal diam:  3.12 cm RV Mid diam:    2.80 cm RV S prime:     10.80 cm/s LEFT ATRIUM              Index       RIGHT ATRIUM           Index LA diam:        5.10 cm  2.67 cm/m  RA Area:     19.00 cm LA Vol (A2C):   116.0 ml 60.77 ml/m RA Volume:   48.20 ml  25.25 ml/m LA Vol (A4C):   97.5 ml  51.08 ml/m LA Biplane Vol: 106.0 ml 55.53 ml/m  AORTIC VALVE AV Area (Vmax):    2.44 cm AV Area (Vmean):   2.87 cm AV Area (VTI):     2.70 cm AV Vmax:           122.00 cm/s AV Vmean:          61.700 cm/s AV VTI:            0.212 m AV Peak Grad:      6.0 mmHg AV Mean Grad:      2.0 mmHg LVOT Vmax:         85.80 cm/s LVOT Vmean:        51.200 cm/s LVOT VTI:          0.165 m LVOT/AV VTI ratio: 0.78  AORTA Ao Root diam: 3.10 cm Ao Asc diam:  3.40 cm MITRAL VALVE                TRICUSPID VALVE MV Area (PHT): 4.06 cm     TR Peak grad:   21.5 mmHg MV Area VTI:   1.76 cm     TR Vmax:        232.00 cm/s MV Peak grad:  9.6 mmHg MV Mean grad:  3.0 mmHg     SHUNTS MV Vmax:       1.55 m/s     Systemic VTI:  0.16 m MV Vmean:      73.4 cm/s    Systemic Diam: 2.10 cm MV Decel Time: 187 msec MV E velocity: 136.00 cm/s Carlyle Dolly MD Electronically signed by Carlyle Dolly MD Signature Date/Time: 12/14/2020/3:48:15 PM    Final      CBC Recent Labs  Lab 12/13/20 1735 12/14/20 0614  WBC 11.2* 9.8  HGB 12.2 12.7  HCT 38.1 39.8  PLT 157 163  MCV 95.5 93.4  MCH 30.6 29.8  MCHC 32.0 31.9  RDW 15.7* 15.3  LYMPHSABS 1.5  --   MONOABS 0.6  --   EOSABS 0.0  --   BASOSABS 0.1  --     Chemistries  Recent Labs  Lab 12/13/20 1735 12/14/20 0614  NA 139 141  K 4.2 3.9  CL 105 97*  CO2 26 33*  GLUCOSE 111* 103*  BUN 10 13  CREATININE 0.59 0.81  CALCIUM  9.0 9.3  MG  --  1.7  AST 16 13*  ALT 12 12  ALKPHOS 89 88  BILITOT 0.8 0.8   ------------------------------------------------------------------------------------------------------------------ No results for input(s): CHOL, HDL, LDLCALC, TRIG, CHOLHDL, LDLDIRECT in the last 72 hours.  Lab Results  Component Value Date   HGBA1C 6.8 (H) 12/13/2020   ------------------------------------------------------------------------------------------------------------------ No results for input(s): TSH, T4TOTAL, T3FREE, THYROIDAB in the last 72 hours.  Invalid input(s): FREET3 ------------------------------------------------------------------------------------------------------------------ No results for input(s): VITAMINB12, FOLATE, FERRITIN, TIBC, IRON, RETICCTPCT in the last 72 hours.  Coagulation profile Recent Labs  Lab 12/14/20 0614  INR 1.6*    No results for input(s): DDIMER in the last 72 hours.  Cardiac Enzymes No results for input(s): CKMB, TROPONINI, MYOGLOBIN in the last 168 hours.  Invalid input(s): CK ------------------------------------------------------------------------------------------------------------------    Component Value Date/Time   BNP 474.0 (H) 12/13/2020 1735     Roxan Hockey M.D on 12/14/2020 at 5:37 PM  Go to [email or web address] - for contact info  [facility] - Office  [phone]

## 2020-12-14 NOTE — Plan of Care (Signed)
  Problem: Education: Goal: Knowledge of General Education information will improve Description: Including pain rating scale, medication(s)/side effects and non-pharmacologic comfort measures 12/14/2020 0303 by Oswaldo Conroy, RN Outcome: Progressing 12/14/2020 0303 by Oswaldo Conroy, RN Outcome: Progressing   Problem: Health Behavior/Discharge Planning: Goal: Ability to manage health-related needs will improve 12/14/2020 0303 by Oswaldo Conroy, RN Outcome: Progressing 12/14/2020 0303 by Oswaldo Conroy, RN Outcome: Progressing   Problem: Clinical Measurements: Goal: Ability to maintain clinical measurements within normal limits will improve 12/14/2020 0303 by Oswaldo Conroy, RN Outcome: Progressing 12/14/2020 0303 by Oswaldo Conroy, RN Outcome: Progressing Goal: Will remain free from infection 12/14/2020 0303 by Oswaldo Conroy, RN Outcome: Progressing 12/14/2020 0303 by Oswaldo Conroy, RN Outcome: Progressing Goal: Diagnostic test results will improve 12/14/2020 0303 by Oswaldo Conroy, RN Outcome: Progressing 12/14/2020 0303 by Oswaldo Conroy, RN Outcome: Progressing Goal: Respiratory complications will improve 12/14/2020 0303 by Oswaldo Conroy, RN Outcome: Progressing 12/14/2020 0303 by Oswaldo Conroy, RN Outcome: Progressing Goal: Cardiovascular complication will be avoided 12/14/2020 0303 by Oswaldo Conroy, RN Outcome: Progressing 12/14/2020 0303 by Oswaldo Conroy, RN Outcome: Progressing   Problem: Activity: Goal: Risk for activity intolerance will decrease 12/14/2020 0303 by Oswaldo Conroy, RN Outcome: Progressing 12/14/2020 0303 by Oswaldo Conroy, RN Outcome: Progressing   Problem: Nutrition: Goal: Adequate nutrition will be maintained 12/14/2020 0303 by Oswaldo Conroy, RN Outcome: Progressing 12/14/2020 0303 by Oswaldo Conroy, RN Outcome: Progressing   Problem: Coping: Goal: Level of anxiety will decrease 12/14/2020 0303 by Oswaldo Conroy, RN Outcome: Progressing 12/14/2020 0303 by Oswaldo Conroy, RN Outcome: Progressing   Problem: Elimination: Goal: Will not experience complications related to bowel motility 12/14/2020 0303 by Oswaldo Conroy, RN Outcome: Progressing 12/14/2020 0303 by Oswaldo Conroy, RN Outcome: Progressing Goal: Will not experience complications related to urinary retention 12/14/2020 0303 by Oswaldo Conroy, RN Outcome: Progressing 12/14/2020 0303 by Oswaldo Conroy, RN Outcome: Progressing   Problem: Pain Managment: Goal: General experience of comfort will improve 12/14/2020 0303 by Oswaldo Conroy, RN Outcome: Progressing 12/14/2020 0303 by Oswaldo Conroy, RN Outcome: Progressing   Problem: Safety: Goal: Ability to remain free from injury will improve 12/14/2020 0303 by Oswaldo Conroy, RN Outcome: Progressing 12/14/2020 0303 by Oswaldo Conroy, RN Outcome: Progressing   Problem: Skin Integrity: Goal: Risk for impaired skin integrity will decrease 12/14/2020 0303 by Oswaldo Conroy, RN Outcome: Progressing 12/14/2020 0303 by Oswaldo Conroy, RN Outcome: Progressing

## 2020-12-15 ENCOUNTER — Inpatient Hospital Stay (HOSPITAL_COMMUNITY): Payer: Medicare Other

## 2020-12-15 LAB — GLUCOSE, CAPILLARY
Glucose-Capillary: 188 mg/dL — ABNORMAL HIGH (ref 70–99)
Glucose-Capillary: 209 mg/dL — ABNORMAL HIGH (ref 70–99)
Glucose-Capillary: 219 mg/dL — ABNORMAL HIGH (ref 70–99)
Glucose-Capillary: 165 mg/dL — ABNORMAL HIGH (ref 70–99)

## 2020-12-15 LAB — URINE CULTURE: Culture: NO GROWTH

## 2020-12-15 MED ORDER — MELATONIN 3 MG PO TABS
6.0000 mg | ORAL_TABLET | Freq: Once | ORAL | Status: AC
  Administered 2020-12-15: 6 mg via ORAL
  Filled 2020-12-15: qty 2

## 2020-12-15 MED ORDER — POLYETHYLENE GLYCOL 3350 17 G PO PACK
17.0000 g | PACK | Freq: Every day | ORAL | Status: DC
  Administered 2020-12-15: 17 g via ORAL
  Filled 2020-12-15 (×2): qty 1

## 2020-12-15 MED ORDER — SENNOSIDES-DOCUSATE SODIUM 8.6-50 MG PO TABS
2.0000 | ORAL_TABLET | Freq: Every day | ORAL | Status: DC
  Administered 2020-12-15: 2 via ORAL
  Filled 2020-12-15: qty 2

## 2020-12-15 NOTE — Progress Notes (Signed)
Patient Demographics:    Amber Stephenson, is a 75 y.o. female, DOB - 1946/04/22, TZG:[id number]  Admit date - 12/13/2020   Admitting Physician Bernadette Hoit, DO  Outpatient Primary MD for the patient is Amber Fraise, MD  LOS - 2   Chief Complaint  Patient presents with   Flank Pain        Subjective:    Amber Stephenson today has no fevers, no emesis,  No chest pain,   Husband at bedside, dyspnea and hypoxia improving -Patient is voiding well  Assessment  & Plan :    Principal Problem:   Acute exacerbation of CHF (congestive heart failure) ([place]) Active Problems:   Chronic atrial fibrillation ([facility])   CAD (coronary artery disease)   Type 2 diabetes mellitus ([place])   Hyperlipidemia   Chronic back pain   Bipolar disorder ([place])   Essential hypertension   Atrial fibrillation with RVR ([place])   Obesity (BMI 30-39.9)   Acute on chronic respiratory failure with hypoxia ([facility])   UTI (urinary tract infection)   Elevated brain natriuretic peptide (BNP) level   Elevated troponin  Brief Summary:- 75 y.o. female with medical history significant for  chronic atrial fibrillation on Eliquis, bipolar 1 disorder, major depressive disorder, nonobstructive CAD, chronic diastolic CHF, hypertension, type 2 diabetes, diabetic neuropathy, chronic lower back pain, hyperlipidemia, hypoxia, insomnia, migraine headaches, nonalcoholic asteatotic hepatitis, OSA, pulmonary hypertension, restless leg syndrome admitted on 12/13/2020 with CHF exacerbation  A/p 1) acute hypoxic respiratory failure secondary to CHF exacerbation -Elevated BNP noted chest x-ray consistent with CHF -Patient quit diuretics a week ago -Continue IV Lasix, fluid input and output monitoring Echo from 12/14/20---- EF is 65 % to 70%, with severely dilated Lt and Rt Atrial -Hypoxia and if no improvement -Currently requiring 2 L of oxygen via nasal cannula  down from 5 L on admission and down from 4 L yesterday-- PTA was Not on home O2 -I am hopeful that we can wean patient off O2 within the next 12 to 24 hours -Urine output is not accurate as patient at times voids into the toilet bowl -  2)Possible PNA-CT suggest possible multifocal pneumonia, PCT less than 0.10 -WBC 11.2, no fevers -Favor CHF over pneumonia -Repeat chest x-ray after diuresis -Currently on IV Rocephin for possible UTI  3)UTI-----patient actually had UTI symptoms and dropped off a urine sample at PCPs office the day prior to admission  -Continue IV Rocephin pending further culture data -May stop IV Rocephin on 12/16/2020 if culture remains negative  4)DM2-A1c 6.8 reflecting good diabetic control PTA --Hold metformin Use Novolog/Humalog Sliding scale insulin with Accu-Cheks/Fingersticks as ordered   5) bipolar disorder--- continue Abilify and lorazepam  6)CAD--- troponin noted, not consistent with ACS, -Continue Lipitor and metoprolol  7) chronic atrial fibrillation--continue Eliquis for stroke prophylaxis on metoprolol and Cardizem for rate control  8)chronic back pain--- PTA patient was on Dilaudid, resume  Disposition/Need for in-Hospital Stay- patient unable to be discharged at this time due to hypoxic respiratory failure requiring IV diuresis* -Anticipate discharge home on 12/16/2020 hopefully without O2  Status is: Inpatient  Remains inpatient appropriate because: Please see disposition above  Disposition: The patient is from: Home  Anticipated d/c is to: Home              Anticipated d/c date is: 1 day              Patient currently is not medically stable to d/c. Barriers: Not Clinically Stable-   Code Status :  -  Code Status: Full Code   Family Communication:    NA (patient is alert, awake and coherent)  Discussed with husband at bedside Consults  :  na  DVT Prophylaxis  :   - SCDs  SCDs Start: 12/13/20 2147 apixaban (ELIQUIS) tablet 5  mg    Lab Results  Component Value Date   PLT 163 12/14/2020    Inpatient Medications  Scheduled Meds:  apixaban  5 mg Oral BID   ARIPiprazole  30 mg Oral Daily   atorvastatin  40 mg Oral Daily   carbamazepine  100 mg Oral BID   diltiazem  120 mg Oral QHS   furosemide  40 mg Intravenous Q12H   insulin aspart  0-15 Units Subcutaneous TID WC   insulin aspart  0-5 Units Subcutaneous QHS   LORazepam  1 mg Oral BID   metoprolol succinate  25 mg Oral Daily   mometasone-formoterol  2 puff Inhalation BID   polyethylene glycol  17 g Oral Daily   senna-docusate  2 tablet Oral QHS   Continuous Infusions:   PRN Meds:.acetaminophen, albuterol, HYDROmorphone    Anti-infectives (From admission, onward)    Start     Dose/Rate Route Frequency Ordered Stop   12/14/20 1000  cefTRIAXone (ROCEPHIN) 1 g in sodium chloride 0.9 % 100 mL IVPB        1 g 200 mL/hr over 30 Minutes Intravenous Every 24 hours 12/14/20 0052 12/15/20 0953   12/13/20 2200  cefTRIAXone (ROCEPHIN) 1 g in sodium chloride 0.9 % 100 mL IVPB        1 g 200 mL/hr over 30 Minutes Intravenous  Once 12/13/20 2151 12/13/20 2249         Objective:   Vitals:   12/14/20 2300 12/15/20 0500 12/15/20 0827 12/15/20 0834  BP: 117/84 110/62 129/85   Pulse: 91 78 81   Resp: 18 18 20    Temp:  98.2 F (36.8 C) 97.6 F (36.4 C)   TempSrc:  Oral Oral   SpO2: 91% 97% 97% 97%  Weight:  90.4 kg    Height:        Wt Readings from Last 3 Encounters:  12/15/20 90.4 kg  12/12/20 93.9 kg  11/30/20 89.8 kg     Intake/Output Summary (Last 24 hours) at 12/15/2020 1121 Last data filed at 12/15/2020 0915 Gross per 24 hour  Intake 710.68 ml  Output 750 ml  Net -39.32 ml     Physical Exam  Gen:- Awake Alert, able to speak in short sentences HEENT:- [place].AT, No sclera icterus Nose- [place] 2L/min Neck-Supple Neck,No JVD,.  Lungs-improving air movement, no wheezing  CV- S1, S2 normal, irregular  Abd-  +ve B.Sounds, Abd Soft, No  tenderness,    Extremity/Skin:- trace  edema, pedal pulses present  Psych-affect is appropriate, oriented x3 Neuro-generalized weakness, no new focal deficits, no tremors   Data Review:   Micro Results Recent Results (from the past 240 hour(s))  Microscopic Examination     Status: Abnormal   Collection Time: 12/13/20  1:39 PM   Urine  Result Value Ref Range Status   WBC, UA None seen 0 - 5 /hpf Final  RBC None seen 0 - 2 /hpf Final   Epithelial Cells (non renal) 0-10 0 - 10 /hpf Final   Bacteria, UA Few (A) None seen/Few Final  Urine Culture     Status: None   Collection Time: 12/13/20  9:52 PM   Specimen: Urine, Clean Catch  Result Value Ref Range Status   Specimen Description   Final    URINE, CLEAN CATCH Performed at [facility], [address], [place], [place] [address]    Special Requests   Final    NONE Performed at [facility], [address], [place], [place] [address]    Culture   Final    NO GROWTH Performed at [facility], [place] [address], [place], [place] [address]    Report Status 12/15/2020 FINAL  Final  MRSA Next Gen by PCR, Nasal     Status: None   Collection Time: 12/14/20  1:39 AM   Specimen: Nasal Mucosa; Nasal Swab  Result Value Ref Range Status   MRSA by PCR Next Gen NOT DETECTED NOT DETECTED Final    Comment: (NOTE) The GeneXpert MRSA Assay (FDA approved for NASAL specimens only), is one component of a comprehensive MRSA colonization surveillance program. It is not intended to diagnose MRSA infection nor to guide or monitor treatment for MRSA infections. Test performance is not FDA approved in patients less than 56 years old. Performed at [facility], [address], [place], [place] [address]     Radiology Reports CT ABDOMEN PELVIS WO CONTRAST  Result Date: 12/13/2020 CLINICAL DATA:  Flank pain, kidney stone suspected. EXAM: CT ABDOMEN AND PELVIS WITHOUT CONTRAST TECHNIQUE: Multidetector CT imaging of the abdomen and pelvis was  performed following the standard protocol without IV contrast. COMPARISON:  CT December 26, 2019. FINDINGS: Lower chest: Tiny bilateral pleural effusions. Bilateral ground-glass interstitial opacities with multifocal nodular consolidations. Cardiomegaly with mitral annulus calcifications and left atrial dilation. Hepatobiliary: Stable subcentimeter hypodense hepatic lesion too small to accurately characterize. Gallbladder is unremarkable. No biliary ductal dilation. Pancreas: Within normal limits. Spleen: Within normal limits. Adrenals/Urinary Tract: Left adrenal thickening without nodularity by size criteria, favor hyperplasia. Right adrenal glands unremarkable. No hydronephrosis. No renal calculi, ureteral or bladder calculi visualized. Urinary bladder is unremarkable. Stomach/Bowel: Stomach is decompressed. No pathologic dilation of small bowel. The appendix and terminal ileum appear normal. Sigmoid diverticulosis without findings of acute diverticulitis. Vascular/Lymphatic: Aortic atherosclerosis without aneurysmal dilation. No pathologically enlarged abdominal or pelvic lymph nodes. Reproductive: Uterus and bilateral adnexa are unremarkable. Other: No abdominopelvic ascites. Multifocal areas of skin thickening and subcutaneous body wall edema including in the right gluteal subcutaneous soft tissues in the left anterior abdominal wall and flank., nonspecific recommend correlation with direct visualization. Musculoskeletal: Multilevel degenerative changes spine. Similar mild wedging deformities of the T11, T12 and L2 vertebral bodies. No acute osseous abnormality. IMPRESSION: 1. No acute intra-abdominal or pelvic findings. Specifically, no evidence of obstructive uropathy. 2. Tiny bilateral pleural effusions with bilateral ground-glass interstitial opacities and multifocal nodular consolidations. Findings are concerning for multifocal pneumonia. 3. Multifocal areas of skin thickening and subcutaneous body wall edema  including in the right gluteal subcutaneous soft tissues in the left anterior abdominal wall and flank. , nonspecific recommend correlation with direct visualization. 4. Sigmoid diverticulosis without findings of acute diverticulitis. 5. Cardiomegaly with mitral annulus calcifications and left atrial dilation, consider further evaluation with cardiac echo if not previously performed. 6.  Aortic Atherosclerosis (ICD10-I70.0). Electronically Signed   By: Dahlia Bailiff MD  On: 12/13/2020 20:24   DG Chest Port 1 View  Result Date: 12/13/2020 CLINICAL DATA:  Shortness of breath and right flank pain. Atrial fibrillation. Cardiomyopathy. EXAM: PORTABLE CHEST 1 VIEW COMPARISON:  09/17/2020 FINDINGS: Patient rotated to the left. Right shoulder arthroplasty. Moderate cardiomegaly. No right-sided pleural effusion. No pneumothorax. Mild pulmonary interstitial prominence and indistinctness. The left lung base is not well evaluated secondary to overlying soft tissues and patient body habitus. IMPRESSION: Cardiomegaly and mild pulmonary venous congestion. Suboptimal evaluation of the left lung base. Electronically Signed   By: Abigail Miyamoto M.D.   On: 12/13/2020 18:41   ECHOCARDIOGRAM COMPLETE  Result Date: 12/14/2020    ECHOCARDIOGRAM REPORT   Patient Name:   MIRKA BARBONE Carreon Date of Exam: 12/14/2020 Medical Rec #:  [id number]         Height:       62.0 in Accession #:    [id number]        Weight:       199.3 lb Date of Birth:  07/27/1945          BSA:          1.909 m Patient Age:    50 years          BP:           109/82 mmHg Patient Gender: F                 HR:           80 bpm. Exam Location:  [facility] Procedure: 2D Echo, Cardiac Doppler and Color Doppler Indications:    Dyspnea  History:        Patient has prior history of Echocardiogram examinations, most                 recent 01/22/2019. CHF, CAD, Arrythmias:Atrial Fibrillation; Risk                 Factors:Hypertension, Diabetes and Dyslipidemia.  Sonographer:     Wenda Low Referring Phys: [place]  1. Left ventricular ejection fraction, by estimation, is 65 to 70%. The left ventricle has normal function. The left ventricle has no regional wall motion abnormalities. There is moderate left ventricular hypertrophy. Left ventricular diastolic parameters are indeterminate.  2. Right ventricular systolic function is low normal. The right ventricular size is mildly enlarged. There is normal pulmonary artery systolic pressure.  3. Left atrial size was severely dilated.  4. Right atrial size was severely dilated.  5. The mitral valve is abnormal. Trivial mitral valve regurgitation. No evidence of mitral stenosis. Moderate mitral annular calcification.  6. The aortic valve was not well visualized. Aortic valve regurgitation is not visualized. No aortic stenosis is present.  7. The inferior vena cava is dilated in size with >50% respiratory variability, suggesting right atrial pressure of 8 mmHg. FINDINGS  Left Ventricle: Left ventricular ejection fraction, by estimation, is 65 to 70%. The left ventricle has normal function. The left ventricle has no regional wall motion abnormalities. The left ventricular internal cavity size was normal in size. There is  moderate left ventricular hypertrophy. Left ventricular diastolic parameters are indeterminate. Right Ventricle: The right ventricular size is mildly enlarged. Right vetricular wall thickness was not assessed. Right ventricular systolic function is low normal. There is normal pulmonary artery systolic pressure. The tricuspid regurgitant velocity is  2.32 m/s, and with an assumed right atrial pressure of 8 mmHg, the estimated right ventricular systolic pressure is 75.1 mmHg.  Left Atrium: Left atrial size was severely dilated. Right Atrium: Right atrial size was severely dilated. Pericardium: There is no evidence of pericardial effusion. Mitral Valve: The mitral valve is abnormal. There is mild  thickening of the mitral valve leaflet(s). There is mild calcification of the mitral valve leaflet(s). Moderate mitral annular calcification. Trivial mitral valve regurgitation. No evidence of mitral valve stenosis. MV peak gradient, 9.6 mmHg. The mean mitral valve gradient is 3.0 mmHg. Tricuspid Valve: The tricuspid valve is not well visualized. Tricuspid valve regurgitation is mild . No evidence of tricuspid stenosis. Aortic Valve: The aortic valve was not well visualized. Aortic valve regurgitation is not visualized. No aortic stenosis is present. Aortic valve mean gradient measures 2.0 mmHg. Aortic valve peak gradient measures 6.0 mmHg. Aortic valve area, by VTI measures 2.70 cm. Pulmonic Valve: The pulmonic valve was not well visualized. Pulmonic valve regurgitation is not visualized. No evidence of pulmonic stenosis. Aorta: The aortic root is normal in size and structure. Venous: The inferior vena cava is dilated in size with greater than 50% respiratory variability, suggesting right atrial pressure of 8 mmHg. IAS/Shunts: No atrial level shunt detected by color flow Doppler.  LEFT VENTRICLE PLAX 2D LVIDd:         4.46 cm LVIDs:         2.86 cm LV PW:         1.30 cm LV IVS:        1.30 cm LVOT diam:     2.10 cm LV SV:         57 LV SV Index:   30 LVOT Area:     3.46 cm  RIGHT VENTRICLE RV Basal diam:  3.12 cm RV Mid diam:    2.80 cm RV S prime:     10.80 cm/s LEFT ATRIUM              Index       RIGHT ATRIUM           Index LA diam:        5.10 cm  2.67 cm/m  RA Area:     19.00 cm LA Vol (A2C):   116.0 ml 60.77 ml/m RA Volume:   48.20 ml  25.25 ml/m LA Vol (A4C):   97.5 ml  51.08 ml/m LA Biplane Vol: 106.0 ml 55.53 ml/m  AORTIC VALVE AV Area (Vmax):    2.44 cm AV Area (Vmean):   2.87 cm AV Area (VTI):     2.70 cm AV Vmax:           122.00 cm/s AV Vmean:          61.700 cm/s AV VTI:            0.212 m AV Peak Grad:      6.0 mmHg AV Mean Grad:      2.0 mmHg LVOT Vmax:         85.80 cm/s LVOT Vmean:         51.200 cm/s LVOT VTI:          0.165 m LVOT/AV VTI ratio: 0.78  AORTA Ao Root diam: 3.10 cm Ao Asc diam:  3.40 cm MITRAL VALVE                TRICUSPID VALVE MV Area (PHT): 4.06 cm     TR Peak grad:   21.5 mmHg MV Area VTI:   1.76 cm     TR Vmax:  232.00 cm/s MV Peak grad:  9.6 mmHg MV Mean grad:  3.0 mmHg     SHUNTS MV Vmax:       1.55 m/s     Systemic VTI:  0.16 m MV Vmean:      73.4 cm/s    Systemic Diam: 2.10 cm MV Decel Time: 187 msec MV E velocity: 136.00 cm/s Carlyle Dolly MD Electronically signed by Carlyle Dolly MD Signature Date/Time: 12/14/2020/3:48:15 PM    Final      CBC Recent Labs  Lab 12/13/20 1735 12/14/20 0614  WBC 11.2* 9.8  HGB 12.2 12.7  HCT 38.1 39.8  PLT 157 163  MCV 95.5 93.4  MCH 30.6 29.8  MCHC 32.0 31.9  RDW 15.7* 15.3  LYMPHSABS 1.5  --   MONOABS 0.6  --   EOSABS 0.0  --   BASOSABS 0.1  --     Chemistries  Recent Labs  Lab 12/13/20 1735 12/14/20 0614  NA 139 141  K 4.2 3.9  CL 105 97*  CO2 26 33*  GLUCOSE 111* 103*  BUN 10 13  CREATININE 0.59 0.81  CALCIUM 9.0 9.3  MG  --  1.7  AST 16 13*  ALT 12 12  ALKPHOS 89 88  BILITOT 0.8 0.8   ------------------------------------------------------------------------------------------------------------------ No results for input(s): CHOL, HDL, LDLCALC, TRIG, CHOLHDL, LDLDIRECT in the last 72 hours.  Lab Results  Component Value Date   HGBA1C 6.8 (H) 12/13/2020   ------------------------------------------------------------------------------------------------------------------ No results for input(s): TSH, T4TOTAL, T3FREE, THYROIDAB in the last 72 hours.  Invalid input(s): FREET3 ------------------------------------------------------------------------------------------------------------------ No results for input(s): VITAMINB12, FOLATE, FERRITIN, TIBC, IRON, RETICCTPCT in the last 72 hours.  Coagulation profile Recent Labs  Lab 12/14/20 0614  INR 1.6*    No results for input(s):  DDIMER in the last 72 hours.  Cardiac Enzymes No results for input(s): CKMB, TROPONINI, MYOGLOBIN in the last 168 hours.  Invalid input(s): CK ------------------------------------------------------------------------------------------------------------------    Component Value Date/Time   BNP 474.0 (H) 12/13/2020 1735     Roxan Hockey M.D on 12/15/2020 at 11:21 AM  Go to [email or web address] - for contact info  [facility] - Office  [phone]

## 2020-12-15 NOTE — Progress Notes (Signed)
RN called due to patient complaining of right ear pain.  I went to bedside and an otoscopic exam was performed with the following findings: The tragus, pinna and ear canal were not swollen and were nontender.  Tympanic membrane was normal in appearance with a good cone of light, ear canal is clear without discharge.  Hearing is intact. Patient was reassured that she does not have an ear infection.  We shall continue with ordered pain meds as needed.

## 2020-12-15 NOTE — Progress Notes (Signed)
ICU transfer, patient stable and resting in bed.

## 2020-12-16 DIAGNOSIS — I5033 Acute on chronic diastolic (congestive) heart failure: Secondary | ICD-10-CM

## 2020-12-16 DIAGNOSIS — J9601 Acute respiratory failure with hypoxia: Secondary | ICD-10-CM

## 2020-12-16 DIAGNOSIS — I5031 Acute diastolic (congestive) heart failure: Secondary | ICD-10-CM | POA: Diagnosis present

## 2020-12-16 HISTORY — DX: Acute on chronic diastolic (congestive) heart failure: I50.33

## 2020-12-16 HISTORY — DX: Acute respiratory failure with hypoxia: J96.01

## 2020-12-16 LAB — BASIC METABOLIC PANEL WITH GFR
Anion gap: 11 (ref 5–15)
BUN: 19 mg/dL (ref 8–23)
CO2: 29 mmol/L (ref 22–32)
Calcium: 9.3 mg/dL (ref 8.9–10.3)
Chloride: 95 mmol/L — ABNORMAL LOW (ref 98–111)
Creatinine, Ser: 0.7 mg/dL (ref 0.44–1.00)
GFR, Estimated: >60 mL/min
Glucose, Bld: 224 mg/dL — ABNORMAL HIGH (ref 70–99)
Potassium: 3.3 mmol/L — ABNORMAL LOW (ref 3.5–5.1)
Sodium: 135 mmol/L (ref 135–145)

## 2020-12-16 LAB — GLUCOSE, CAPILLARY
Glucose-Capillary: 207 mg/dL — ABNORMAL HIGH (ref 70–99)
Glucose-Capillary: 297 mg/dL — ABNORMAL HIGH (ref 70–99)

## 2020-12-16 LAB — SARS CORONAVIRUS 2 (TAT 6-24 HRS): SARS Coronavirus 2: NEGATIVE

## 2020-12-16 MED ORDER — SENNOSIDES-DOCUSATE SODIUM 8.6-50 MG PO TABS: 2.0000 | ORAL_TABLET | Freq: Every day | ORAL | 2 refills | Status: DC

## 2020-12-16 MED ORDER — METHYLPREDNISOLONE SODIUM SUCC 125 MG IJ SOLR
125.0000 mg | Freq: Once | INTRAMUSCULAR | Status: AC
  Administered 2020-12-16: 125 mg via INTRAVENOUS
  Filled 2020-12-16: qty 2

## 2020-12-16 MED ORDER — FUROSEMIDE 40 MG PO TABS: 40.0000 mg | ORAL_TABLET | Freq: Every morning | ORAL | 3 refills | Status: DC

## 2020-12-16 MED ORDER — AZITHROMYCIN 500 MG PO TABS: 500.0000 mg | ORAL_TABLET | Freq: Every day | ORAL | 0 refills | Status: AC

## 2020-12-16 MED ORDER — METOPROLOL SUCCINATE ER 50 MG PO TB24
100.0000 mg | ORAL_TABLET | Freq: Once | ORAL | Status: AC
  Administered 2020-12-16: 100 mg via ORAL
  Filled 2020-12-16: qty 2

## 2020-12-16 MED ORDER — DILTIAZEM HCL 30 MG PO TABS
30.0000 mg | ORAL_TABLET | Freq: Once | ORAL | Status: AC
  Administered 2020-12-16: 30 mg via ORAL
  Filled 2020-12-16: qty 1

## 2020-12-16 MED ORDER — ACETAMINOPHEN 325 MG PO TABS: 650.0000 mg | ORAL_TABLET | Freq: Four times a day (QID) | ORAL | 0 refills | Status: DC | PRN

## 2020-12-16 NOTE — Discharge Summary (Signed)
Amber Stephenson, is a 75 y.o. female  DOB September 27, 1945  MRN [id number].  Admission date:  12/13/2020  Admitting Physician  Bernadette Hoit, DO  Discharge Date:  12/16/2020   Primary MD  [name], MD  Recommendations for primary care physician for things to follow:   1)Avoid ibuprofen/Advil/Aleve/Motrin/Goody Powders/Naproxen/BC powders/Meloxicam/Diclofenac/Indomethacin and other Nonsteroidal anti-inflammatory medications as these will make you more likely to bleed and can cause stomach ulcers, can also cause Kidney problems.   2)You are taking Eliquis/apixaban which is a blood thinner so avoid nonsteroidal anti-inflammatory agents as outlined above in #1  3)Very low-salt diet advised  4)Weigh yourself daily, call if you gain more than 3 pounds in 1 day or more than 5 pounds in 1 week as your diuretic medications may need to be adjusted   Admission Diagnosis  SOB (shortness of breath) [R06.02] Acute exacerbation of CHF (congestive heart failure) (HCC) [I50.9] Congestive heart failure, unspecified HF chronicity, unspecified heart failure type ([facility]) [I50.9]   Discharge Diagnosis  SOB (shortness of breath) [R06.02] Acute exacerbation of CHF (congestive heart failure) (HCC) [I50.9] Congestive heart failure, unspecified HF chronicity, unspecified heart failure type ([facility]) [I50.9]    Principal Problem:   Acute on chronic diastolic CHF (congestive heart failure)/HFpEF Exacerbation Active Problems:   Acute exacerbation of CHF (congestive heart failure) (HCC)   Acute respiratory failure with hypoxia (HCC)   Chronic atrial fibrillation (HCC)   CAD (coronary artery disease)   Type 2 diabetes mellitus (HCC)   Hyperlipidemia   Chronic back pain   Bipolar disorder ([place])   Essential hypertension   Atrial fibrillation with RVR (HCC)   Obesity (BMI 30-39.9)   UTI (urinary tract infection)   Elevated brain  natriuretic peptide (BNP) level   Elevated troponin      Past Medical History:  Diagnosis Date   Acute on chronic respiratory failure with hypoxia ([place]) 01/22/2019   Atrial fibrillation with RVR 05/19/2017   Bipolar I disorder 01/23/2015   CAD (coronary artery disease)    Nonobstructive; Managed by Dr. 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 Wallace Keller   RLS (restless legs syndrome) 04/27/2015    Past Surgical History:  Procedure Laterality Date   BREAST REDUCTION SURGERY  EYE SURGERY Right    cateracts   HAMMER TOE SURGERY     LEFT HEART CATH AND CORONARY ANGIOGRAPHY N/A 02/03/2018   Procedure: LEFT HEART CATH AND CORONARY ANGIOGRAPHY;  Surgeon: Martinique, Peter M, MD;  Location: [facility];  Service: Cardiovascular;  Laterality: N/A;   REVERSE SHOULDER ARTHROPLASTY Right 08/19/2019   Procedure: REVERSE SHOULDER ARTHROPLASTY;  Surgeon: Netta Cedars, MD;  Location: [facility];  Service: Orthopedics;  Laterality: Right;  interscalene block   SHOULDER SURGERY Right    "I BROKE MY SHOUDLER   THIGH SURGERY     "TO REMOVE A TUMOR "     HPI  from the history and physical done on the day of admission:    Chief Complaint: Shortness of breath   HPI: Amber Stephenson is a 75 y.o. female with medical history significant for  chronic atrial fibrillation on Eliquis, bipolar 1 disorder, major depressive disorder, nonobstructive CAD, chronic diastolic CHF, hypertension, type 2 diabetes, diabetic neuropathy, chronic lower back pain, hyperlipidemia, hypoxia, insomnia, migraine headaches, nonalcoholic asteatotic hepatitis, OSA, pulmonary hypertension, restless leg syndrome who presents to the emergency department due to 3-day onset of shortness of breath associated with dry cough and upper back pain.  She states that her Lasix was increased from 20 to 40 mg daily about a month ago, this was followed by increased urinary frequency and about a week ago, she got tired of frequent urination so she stopped taking her Lasix, about 3 days ago, she started to notice increased shortness of breath, abdominal and leg swelling, she also complained of suprapubic pain which resulted in consulting with PCP by phone this morning and when she was prescribed with an antibiotic for UTI, she was also advised to restart oral antibiotics and to go to the ED if her symptoms does not improve.  She denies fever, chills, chest pain, headache, abdominal pain or constipation.   ED Course:  In the emergency department, she was intermittently tachypneic, BP was 163/97, patient was requiring supplemental oxygen via  at 5 LPM to maintain O2 sats above 90s.  Work-up in the ED showed normal CBC except for mild leukocytosis and normal BMP.  BNP 474, troponin x2- 30 > 26. CT abdomen and pelvis without contrast showed no acute intra-abdominal or pelvic findings.  Tiny bilateral pleural effusions with bilateral groundglass interstitial opacities multifocal nodular consolidations with findings concerning for multifocal pneumonia was noted. Chest x-ray showed cardiomegaly and mild pulmonary venous congestion. Patient was treated with IV ceftriaxone due to presumed UTI, IV fentanyl and was given due to pain.   IV Lasix was given with an output of 400 cc at bedside.  Hospitalist was asked to admit patient for further evaluation and management.     Hospital Course:    Brief Summary:- 75 y.o. female with medical history significant for  chronic atrial fibrillation on Eliquis, bipolar 1 disorder, major depressive disorder, nonobstructive CAD, chronic diastolic CHF, hypertension, type 2 diabetes, diabetic neuropathy, chronic lower back pain, hyperlipidemia, hypoxia, insomnia, migraine headaches, nonalcoholic asteatotic hepatitis, OSA, pulmonary hypertension, restless leg syndrome admitted on 12/13/2020 with CHF exacerbation   A/p 1) acute hypoxic respiratory failure secondary to acute on chronic dCHF exacerbation -Elevated BNP noted chest x-ray consistent with CHF -Patient quit diuretics a week PTA -Hypoxia resolved with IV Lasix, on admission patient initially required up to 5 L of oxygen and was slowly weaned off oxygen over a couple days Echo from 12/14/20---- EF is 65 % to 70%,  with severely dilated Lt and Rt Atrial -- PTA was Not on home O2 -Okay to discharge on Lasix 40 mg daily  2)Possible PNA/Bronchtitis -CT suggest possible multifocal pneumonia, PCT less than 0.10, repeat chest x-ray on 12/15/2020 after IV diuresis suggestive of bronchitis--okay to treat with azithromycin -WBC 11.2, no fevers -Overall clinical and imaging picture Favor CHF over pneumonia   3)UTI-----patient actually had UTI symptoms and dropped off a urine sample at PCPs office the day prior to admission  Treated with IV Rocephin , discontinued as urine culture remained negative   4)DM2-A1c 6.8 reflecting good diabetic control PTA -Okay to restart metformin and insulin regimen   5) bipolar disorder--- continue Abilify and lorazepam   6)CAD--- troponin noted, not consistent with ACS, -Continue Lipitor and metoprolol   7) chronic atrial fibrillation--continue Eliquis for stroke prophylaxis, Resume PTA metoprolol and  Cardizem for rate control   8)chronic back pain--- PTA patient was on Dilaudid, resume -Avoid NSAIDs while on Eliquis   Disposition/--- discharge home on room air   Disposition: The patient is from: Home              Anticipated d/c is to: Home                Code Status :  -  Code Status: Full Code    Family Communication:    NA (patient is alert, awake and coherent) Discussed with husband at bedside  Discharge Condition: stable w/o Hypoxia  Follow UP---PCP   Diet and Activity recommendation:  As advised  Discharge Instructions    Discharge Instructions     (HEART FAILURE PATIENTS) Call MD:  Anytime you have any of the following symptoms: 1) 3 pound weight gain in 24 hours or 5 pounds in 1 week 2) shortness of breath, with or without a dry hacking cough 3) swelling in the hands, feet or stomach 4) if you have to sleep on extra pillows at night in order to breathe.   Complete by: As directed    Call MD for:  difficulty breathing, headache or visual disturbances   Complete by: As directed    Call MD for:  persistant dizziness or light-headedness   Complete by: As directed    Call MD for:  persistant nausea and vomiting   Complete by: As directed    Call MD for:  severe uncontrolled pain   Complete by: As directed    Call MD for:  temperature >100.4   Complete by: As directed    Diet - low sodium heart healthy   Complete by: As directed    Diet Carb Modified   Complete by: As directed    Discharge instructions   Complete by: As directed    1)Avoid ibuprofen/Advil/Aleve/Motrin/Goody Powders/Naproxen/BC powders/Meloxicam/Diclofenac/Indomethacin and other Nonsteroidal anti-inflammatory medications as these will make you more likely to bleed and can cause stomach ulcers, can also cause Kidney problems.   2)You are taking Eliquis/apixaban which is a blood thinner so avoid nonsteroidal anti-inflammatory agents as outlined above in #1  3)Very low-salt diet advised  4)Weigh  yourself daily, call if you gain more than 3 pounds in 1 day or more than 5 pounds in 1 week as your diuretic medications may need to be adjusted   Increase activity slowly   Complete by: As directed        Discharge Medications     Allergies as of 12/16/2020       Reactions   Ivp Dye [iodinated Diagnostic Agents] Anaphylaxis,  Swelling   Throat closes   Codeine Other (See Comments)   Iodine Other (See Comments)   Latex    Latex tape pulls skin with it        Medication List     STOP taking these medications    cefdinir 300 MG capsule Commonly known as: OMNICEF   diphenoxylate-atropine 2.5-0.025 MG tablet Commonly known as: Lomotil   fluconazole 150 MG tablet Commonly known as: Diflucan   ibuprofen 800 MG tablet Commonly known as: ADVIL   neomycin-polymyxin-hydrocortisone OTIC solution Commonly known as: CORTISPORIN   Probiotic (Lactobacillus) Caps   temazepam 30 MG capsule Commonly known as: RESTORIL   Venelex Oint       TAKE these medications    acetaminophen 325 MG tablet Commonly known as: TYLENOL Take 2 tablets (650 mg total) by mouth every 6 (six) hours as needed for mild pain, moderate pain or fever.   albuterol (2.5 MG/3ML) 0.083% nebulizer solution Commonly known as: PROVENTIL Take 3 mLs (2.5 mg total) by nebulization every 6 (six) hours as needed for wheezing or shortness of breath.   albuterol 108 (90 Base) MCG/ACT inhaler Commonly known as: VENTOLIN HFA Inhale 2 puffs into the lungs every 6 (six) hours as needed for wheezing or shortness of breath.   apixaban 5 MG Tabs tablet Commonly known as: Eliquis Take 1 tablet (5 mg total) by mouth 2 (two) times daily.   ARIPiprazole 10 MG tablet Commonly known as: ABILIFY Take 3 tablets (30 mg total) by mouth daily.   atorvastatin 40 MG tablet Commonly known as: LIPITOR TAKE 1 TABLET DAILY   azithromycin 500 MG tablet Commonly known as: ZITHROMAX Take 1 tablet (500 mg total) by mouth  daily for 3 days.   carbamazepine 100 MG 12 hr capsule Commonly known as: CARBATROL Take by mouth at bedtime.   Dexcom G6 Sensor Misc 1 Device by Does not apply route as directed.   Dexcom G6 Transmitter Misc 1 Device by Does not apply route as directed.   diltiazem 120 MG 24 hr capsule Commonly known as: CARDIZEM CD Take 1 capsule (120 mg total) by mouth at bedtime.   Emgality 120 MG/ML Soaj Generic drug: Galcanezumab-gnlm Administer once a month starting 09/27/2020   flurazepam 30 MG capsule Commonly known as: DALMANE Take 30 mg by mouth at bedtime as needed.   Fluticasone-Salmeterol 500-50 MCG/DOSE Aepb Commonly known as: Advair Diskus Inhale 1 puff into the lungs in the morning and at bedtime.   furosemide 40 MG tablet Commonly known as: LASIX Take 1 tablet (40 mg total) by mouth every morning. TAKE 1 TABLET EVERY MORNING What changed:  medication strength how much to take when to take this   HYDROmorphone 2 MG tablet Commonly known as: Dilaudid Take 1 tablet (2 mg total) by mouth every 6 (six) hours as needed for severe pain.   icosapent Ethyl 1 g capsule Commonly known as: Vascepa TAKE 2 CAPSULES TWICE A DAY WITH MEALS   Lantus SoloStar 100 UNIT/ML Solostar Pen Generic drug: insulin glargine Inject 45 Units into the skin 2 (two) times daily.   LORazepam 1 MG tablet Commonly known as: ATIVAN Take 1 tablet (1 mg total) by mouth 2 (two) times daily. And give 2 tablets by mouth at bedtime   meclizine 12.5 MG tablet Commonly known as: ANTIVERT Take 1 tablet (12.5 mg total) by mouth 3 (three) times daily as needed for dizziness.   metoprolol succinate 25 MG 24 hr tablet Commonly known as: Toprol  XL Take 1 tablet (25 mg total) by mouth in the morning and at bedtime.   metoprolol succinate 100 MG 24 hr tablet Commonly known as: TOPROL-XL Take 1 tablet (100 mg total) by mouth in the morning and at bedtime.   NON FORMULARY Diet: _____ Regular, ___  __  NAS, ___x____Consistent Carbohydrate, _______NPO _____Other   polyethylene glycol 17 g packet Commonly known as: MIRALAX / GLYCOLAX Take 17 g by mouth daily.   potassium chloride SA 20 MEQ tablet Commonly known as: KLOR-CON Take 1 tablet (20 mEq total) by mouth daily.   pregabalin 200 MG capsule Commonly known as: Lyrica Take 1 capsule (200 mg total) by mouth 2 (two) times daily.   Prolia 60 MG/ML Sosy injection Generic drug: denosumab Inject 60 mg into the skin every 6 (six) months.   Rybelsus 14 MG Tabs Generic drug: Semaglutide Take 14 mg by mouth daily.   senna-docusate 8.6-50 MG tablet Commonly known as: Senokot-S Take 2 tablets by mouth at bedtime.   SUMAtriptan 100 MG tablet Commonly known as: IMITREX Take 100 mg by mouth as directed. What changed: Another medication with the same name was removed. Continue taking this medication, and follow the directions you see here.   traZODone 100 MG tablet Commonly known as: DESYREL Take 400 mg by mouth at bedtime.   Vitamin D (Cholecalciferol) 25 MCG (1000 UT) Tabs Take 1 tablet by mouth daily in the afternoon.       ASK your doctor about these medications    escitalopram 10 MG tablet Commonly known as: Lexapro Take 1 tablet (10 mg total) by mouth daily.   insulin lispro 100 UNIT/ML KwikPen Commonly known as: HumaLOG KwikPen SSI changed by endocrine   metFORMIN 500 MG tablet Commonly known as: GLUCOPHAGE Take 2 tablets (1,000 mg total) by mouth 2 (two) times daily with a meal.   tiZANidine 4 MG capsule Commonly known as: ZANAFLEX TAKE 1 CAPSULE BY MOUTH THREE TIMES DAILY AS NEEDED FOR MUSCLE SPASMS       Major procedures and Radiology Reports - PLEASE review detailed and final reports for all details, in brief -   CT ABDOMEN PELVIS WO CONTRAST  Result Date: 12/13/2020 CLINICAL DATA:  Flank pain, kidney stone suspected. EXAM: CT ABDOMEN AND PELVIS WITHOUT CONTRAST TECHNIQUE: Multidetector CT imaging of  the abdomen and pelvis was performed following the standard protocol without IV contrast. COMPARISON:  CT December 26, 2019. FINDINGS: Lower chest: Tiny bilateral pleural effusions. Bilateral ground-glass interstitial opacities with multifocal nodular consolidations. Cardiomegaly with mitral annulus calcifications and left atrial dilation. Hepatobiliary: Stable subcentimeter hypodense hepatic lesion too small to accurately characterize. Gallbladder is unremarkable. No biliary ductal dilation. Pancreas: Within normal limits. Spleen: Within normal limits. Adrenals/Urinary Tract: Left adrenal thickening without nodularity by size criteria, favor hyperplasia. Right adrenal glands unremarkable. No hydronephrosis. No renal calculi, ureteral or bladder calculi visualized. Urinary bladder is unremarkable. Stomach/Bowel: Stomach is decompressed. No pathologic dilation of small bowel. The appendix and terminal ileum appear normal. Sigmoid diverticulosis without findings of acute diverticulitis. Vascular/Lymphatic: Aortic atherosclerosis without aneurysmal dilation. No pathologically enlarged abdominal or pelvic lymph nodes. Reproductive: Uterus and bilateral adnexa are unremarkable. Other: No abdominopelvic ascites. Multifocal areas of skin thickening and subcutaneous body wall edema including in the right gluteal subcutaneous soft tissues in the left anterior abdominal wall and flank., nonspecific recommend correlation with direct visualization. Musculoskeletal: Multilevel degenerative changes spine. Similar mild wedging deformities of the T11, T12 and L2 vertebral bodies. No acute osseous abnormality. IMPRESSION:  1. No acute intra-abdominal or pelvic findings. Specifically, no evidence of obstructive uropathy. 2. Tiny bilateral pleural effusions with bilateral ground-glass interstitial opacities and multifocal nodular consolidations. Findings are concerning for multifocal pneumonia. 3. Multifocal areas of skin thickening and  subcutaneous body wall edema including in the right gluteal subcutaneous soft tissues in the left anterior abdominal wall and flank. , nonspecific recommend correlation with direct visualization. 4. Sigmoid diverticulosis without findings of acute diverticulitis. 5. Cardiomegaly with mitral annulus calcifications and left atrial dilation, consider further evaluation with cardiac echo if not previously performed. 6.  Aortic Atherosclerosis (ICD10-I70.0). Electronically Signed   By: Dahlia Bailiff MD   On: 12/13/2020 20:24   DG Chest 2 View  Result Date: 12/15/2020 CLINICAL DATA:  75 year old female with history of upper back pain. EXAM: CHEST - 2 VIEW COMPARISON:  Chest x-ray 12/13/2020. FINDINGS: Lung volumes are normal. No consolidative airspace disease. No pleural effusions. No pneumothorax. Diffuse peribronchial cuffing. No evidence of pulmonary edema. Heart size is moderately enlarged. Upper mediastinal contours are within normal limits. Aortic atherosclerosis. Status post right shoulder arthroplasty. IMPRESSION: 1. Diffuse peribronchial cuffing, concerning for an acute bronchitis. 2. Aortic atherosclerosis. 3. Moderate cardiomegaly. Electronically Signed   By: Vinnie Langton M.D.   On: 12/15/2020 12:06   DG Chest Port 1 View  Result Date: 12/13/2020 CLINICAL DATA:  Shortness of breath and right flank pain. Atrial fibrillation. Cardiomyopathy. EXAM: PORTABLE CHEST 1 VIEW COMPARISON:  09/17/2020 FINDINGS: Patient rotated to the left. Right shoulder arthroplasty. Moderate cardiomegaly. No right-sided pleural effusion. No pneumothorax. Mild pulmonary interstitial prominence and indistinctness. The left lung base is not well evaluated secondary to overlying soft tissues and patient body habitus. IMPRESSION: Cardiomegaly and mild pulmonary venous congestion. Suboptimal evaluation of the left lung base. Electronically Signed   By: Abigail Miyamoto M.D.   On: 12/13/2020 18:41   ECHOCARDIOGRAM COMPLETE  Result  Date: 12/14/2020    ECHOCARDIOGRAM REPORT   Patient Name:   Amber Stephenson Date of Exam: 12/14/2020 Medical Rec #:  [id number]         Height:       62.0 in Accession #:    [id number]        Weight:       199.3 lb Date of Birth:  March 28, 1946          BSA:          1.909 m Patient Age:    40 years          BP:           109/82 mmHg Patient Gender: F                 HR:           80 bpm. Exam Location:  [facility] Procedure: 2D Echo, Cardiac Doppler and Color Doppler Indications:    Dyspnea  History:        Patient has prior history of Echocardiogram examinations, most                 recent 01/22/2019. CHF, CAD, Arrythmias:Atrial Fibrillation; Risk                 Factors:Hypertension, Diabetes and Dyslipidemia.  Sonographer:    Wenda Low Referring Phys: [place]  1. Left ventricular ejection fraction, by estimation, is 65 to 70%. The left ventricle has normal function. The left ventricle has no regional wall motion abnormalities. There is moderate left ventricular hypertrophy. Left  ventricular diastolic parameters are indeterminate.  2. Right ventricular systolic function is low normal. The right ventricular size is mildly enlarged. There is normal pulmonary artery systolic pressure.  3. Left atrial size was severely dilated.  4. Right atrial size was severely dilated.  5. The mitral valve is abnormal. Trivial mitral valve regurgitation. No evidence of mitral stenosis. Moderate mitral annular calcification.  6. The aortic valve was not well visualized. Aortic valve regurgitation is not visualized. No aortic stenosis is present.  7. The inferior vena cava is dilated in size with >50% respiratory variability, suggesting right atrial pressure of 8 mmHg. FINDINGS  Left Ventricle: Left ventricular ejection fraction, by estimation, is 65 to 70%. The left ventricle has normal function. The left ventricle has no regional wall motion abnormalities. The left ventricular internal cavity size was  normal in size. There is  moderate left ventricular hypertrophy. Left ventricular diastolic parameters are indeterminate. Right Ventricle: The right ventricular size is mildly enlarged. Right vetricular wall thickness was not assessed. Right ventricular systolic function is low normal. There is normal pulmonary artery systolic pressure. The tricuspid regurgitant velocity is  2.32 m/s, and with an assumed right atrial pressure of 8 mmHg, the estimated right ventricular systolic pressure is 57.8 mmHg. Left Atrium: Left atrial size was severely dilated. Right Atrium: Right atrial size was severely dilated. Pericardium: There is no evidence of pericardial effusion. Mitral Valve: The mitral valve is abnormal. There is mild thickening of the mitral valve leaflet(s). There is mild calcification of the mitral valve leaflet(s). Moderate mitral annular calcification. Trivial mitral valve regurgitation. No evidence of mitral valve stenosis. MV peak gradient, 9.6 mmHg. The mean mitral valve gradient is 3.0 mmHg. Tricuspid Valve: The tricuspid valve is not well visualized. Tricuspid valve regurgitation is mild . No evidence of tricuspid stenosis. Aortic Valve: The aortic valve was not well visualized. Aortic valve regurgitation is not visualized. No aortic stenosis is present. Aortic valve mean gradient measures 2.0 mmHg. Aortic valve peak gradient measures 6.0 mmHg. Aortic valve area, by VTI measures 2.70 cm. Pulmonic Valve: The pulmonic valve was not well visualized. Pulmonic valve regurgitation is not visualized. No evidence of pulmonic stenosis. Aorta: The aortic root is normal in size and structure. Venous: The inferior vena cava is dilated in size with greater than 50% respiratory variability, suggesting right atrial pressure of 8 mmHg. IAS/Shunts: No atrial level shunt detected by color flow Doppler.  LEFT VENTRICLE PLAX 2D LVIDd:         4.46 cm LVIDs:         2.86 cm LV PW:         1.30 cm LV IVS:        1.30 cm LVOT  diam:     2.10 cm LV SV:         57 LV SV Index:   30 LVOT Area:     3.46 cm  RIGHT VENTRICLE RV Basal diam:  3.12 cm RV Mid diam:    2.80 cm RV S prime:     10.80 cm/s LEFT ATRIUM              Index       RIGHT ATRIUM           Index LA diam:        5.10 cm  2.67 cm/m  RA Area:     19.00 cm LA Vol (A2C):   116.0 ml 60.77 ml/m RA Volume:   48.20 ml  25.25  ml/m LA Vol (A4C):   97.5 ml  51.08 ml/m LA Biplane Vol: 106.0 ml 55.53 ml/m  AORTIC VALVE AV Area (Vmax):    2.44 cm AV Area (Vmean):   2.87 cm AV Area (VTI):     2.70 cm AV Vmax:           122.00 cm/s AV Vmean:          61.700 cm/s AV VTI:            0.212 m AV Peak Grad:      6.0 mmHg AV Mean Grad:      2.0 mmHg LVOT Vmax:         85.80 cm/s LVOT Vmean:        51.200 cm/s LVOT VTI:          0.165 m LVOT/AV VTI ratio: 0.78  AORTA Ao Root diam: 3.10 cm Ao Asc diam:  3.40 cm MITRAL VALVE                TRICUSPID VALVE MV Area (PHT): 4.06 cm     TR Peak grad:   21.5 mmHg MV Area VTI:   1.76 cm     TR Vmax:        232.00 cm/s MV Peak grad:  9.6 mmHg MV Mean grad:  3.0 mmHg     SHUNTS MV Vmax:       1.55 m/s     Systemic VTI:  0.16 m MV Vmean:      73.4 cm/s    Systemic Diam: 2.10 cm MV Decel Time: 187 msec MV E velocity: 136.00 cm/s Carlyle Dolly MD Electronically signed by Carlyle Dolly MD Signature Date/Time: 12/14/2020/3:48:15 PM    Final     Micro Results   Recent Results (from the past 240 hour(s))  Urine Culture     Status: None   Collection Time: 12/13/20  1:39 PM   Specimen: Urine   UR  Result Value Ref Range Status   Urine Culture, Routine Final report  Final   Organism ID, Bacteria Comment  Final    Comment: Mixed urogenital flora Less than 10,000 colonies/mL   Microscopic Examination     Status: Abnormal   Collection Time: 12/13/20  1:39 PM   Urine  Result Value Ref Range Status   WBC, UA None seen 0 - 5 /hpf Final   RBC None seen 0 - 2 /hpf Final   Epithelial Cells (non renal) 0-10 0 - 10 /hpf Final   Bacteria, UA Few  (A) None seen/Few Final  Urine Culture     Status: None   Collection Time: 12/13/20  9:52 PM   Specimen: Urine, Clean Catch  Result Value Ref Range Status   Specimen Description   Final    URINE, CLEAN CATCH Performed at [facility], [address] [place], [place] [address]    Special Requests   Final    NONE Performed at [facility], [address] [place], [place] [address]    Culture   Final    NO GROWTH Performed at [facility], [place] [address] [place], [place] [address]    Report Status 12/15/2020 FINAL  Final  MRSA Next Gen by PCR, Nasal     Status: None   Collection Time: 12/14/20  1:39 AM   Specimen: Nasal Mucosa; Nasal Swab  Result Value Ref Range Status   MRSA by PCR Next Gen NOT DETECTED NOT DETECTED Final    Comment: (NOTE)  The GeneXpert MRSA Assay (FDA approved for NASAL specimens only), is one component of a comprehensive MRSA colonization surveillance program. It is not intended to diagnose MRSA infection nor to guide or monitor treatment for MRSA infections. Test performance is not FDA approved in patients less than 49 years old. Performed at [facility], [address] [place], [place] [address]     Today   [place]oh today has no new complaints  No fever  Or chills   No Nausea, Vomiting or Diarrhea -Husband at bedside -No further ear pain - Post ambulation oxygen sats on RA is 95 to 96 %        Patient has been seen and examined prior to discharge   Objective   Blood pressure (!) 156/101, pulse (!) 117, temperature 98.1 F (36.7 C), resp. rate 18, height 5\' 2"  (1.575 m), weight 88 kg, SpO2 91 %.   Intake/Output Summary (Last 24 hours) at 12/16/2020 1256 Last data filed at 12/16/2020 0900 Gross per 24 hour  Intake 800 ml  Output 1800 ml  Net -1000 ml    Exam Gen:- Awake Alert, able to speak in full sentences HEENT:- [place].AT, No sclera icterus Neck-Supple Neck,No JVD,. Lungs-improving air movement, no wheezing   CV- S1, S2 normal, irregular Abd-  +ve B.Sounds, Abd Soft, No tenderness,    Extremity/Skin:- No  edema, pedal pulses present Psych-affect is appropriate, oriented x3 Neuro-no new focal deficits, no tremors   Data Review   CBC w Diff:  Lab Results  Component Value Date   WBC 9.8 12/14/2020   HGB 12.7 12/14/2020   HGB 14.2 11/14/2020   HCT 39.8 12/14/2020   HCT 42.1 11/14/2020   PLT 163 12/14/2020   PLT 226 11/14/2020   LYMPHOPCT 13 12/13/2020   MONOPCT 5 12/13/2020   EOSPCT 0 12/13/2020   BASOPCT 0 12/13/2020    CMP:  Lab Results  Component Value Date   NA 135 12/16/2020   NA 142 11/14/2020   K 3.3 (L) 12/16/2020   CL 95 (L) 12/16/2020   CO2 29 12/16/2020   BUN 19 12/16/2020   BUN 11 11/14/2020   CREATININE 0.70 12/16/2020   PROT 7.1 12/14/2020   PROT 7.0 11/14/2020   ALBUMIN 3.6 12/14/2020   ALBUMIN 4.3 11/14/2020   BILITOT 0.8 12/14/2020   BILITOT 0.2 11/14/2020   ALKPHOS 88 12/14/2020   AST 13 (L) 12/14/2020   ALT 12 12/14/2020  .   Total Discharge time is about 33 minutes  Roxan Hockey M.D on 12/16/2020 at 12:56 PM  Go to [email or web address] -  for contact info  [facility] - Office  [phone]

## 2020-12-16 NOTE — Progress Notes (Signed)
Patient complaining of lower back and right ear pain. Patient stated her right ear pain started today, but she forgot to mention it to the doctor. Describes ear pain as a intermittent ache. Patient given Dilaudid that she states has helped. Patient requesting the MD look at her ear. Dr. [name] Cables at bedside to examine patient's ear.  Patient also requesting something for sleep. MD aware. Melatonin 6mg  PO once given.

## 2020-12-17 ENCOUNTER — Telehealth: Payer: Self-pay

## 2020-12-17 NOTE — Progress Notes (Signed)
Hello Liberta,  Your lab result is normal and/or stable.Some minor variations that are not significant are commonly marked abnormal, but do not represent any medical problem for you.  Best regards, Kjerstin Abrigo, M.D.

## 2020-12-17 NOTE — Telephone Encounter (Signed)
Transition Care Management Unsuccessful Follow-up Telephone Call  Date of discharge and from where:  [facility] 12[facility]16[facility]20  Diagnosis: CHF exacerbation, SOB   Attempts:  1st Attempt  Reason for unsuccessful TCM follow-up call:  No answer/busy   Transition Care Management Unsuccessful Follow-up Telephone Call  Date of discharge and from where:  [facility] 12/16/20  Diagnosis: SOB, CHF exacerbation   Attempts:  2nd Attempt  Reason for unsuccessful TCM follow-up call:  Left voice message

## 2020-12-19 NOTE — Telephone Encounter (Signed)
Transition Care Management Follow-up Telephone Call Date of discharge and from where: [facility] 12/16/20 Diagnosis: SOB, CHF exacerbation How have you been since you were released from the hospital? Weak, tired, SOB, antibx gave her diarrhea - she feels stable overall Any questions or concerns? No  Items Reviewed: Did the pt receive and understand the discharge instructions provided? Yes  Medications obtained and verified? Yes  Other? No  Any new allergies since your discharge? No  Dietary orders reviewed? Yes Do you have support at home?  When her husband is home and not working only  [place] and Equipment/Supplies: Were home health services ordered? no If so, what is the name of the agency? N/a  Has the agency set up a time to come to the patient's home? not applicable Were any new equipment or medical supplies ordered?  No What is the name of the medical supply agency? N/a Were you able to get the supplies/equipment? not applicable Do you have any questions related to the use of the equipment or supplies? No  Functional Questionnaire: (I = Independent and D = Dependent) ADLs: I  Bathing/Dressing- I  Meal Prep- I  Eating- I  Maintaining continence- I  Transferring/Ambulation- I  Managing Meds- I  Follow up appointments reviewed:  PCP Hospital f/u appt confirmed? Yes  Scheduled to see [facility] on 7/28 @ 2:55. [facility] f/u appt confirmed? Yes  Scheduled to see Pulnmonology on 7/25 @ 2. Are transportation arrangements needed? No  If their condition worsens, is the pt aware to call PCP or go to the Emergency Dept.? Yes Was the patient provided with contact information for the PCP's office or ED? Yes Was to pt encouraged to call back with questions or concerns? Yes

## 2020-12-20 ENCOUNTER — Other Ambulatory Visit: Payer: Self-pay | Admitting: Family Medicine

## 2020-12-21 DIAGNOSIS — Z79899 Other long term (current) drug therapy: Secondary | ICD-10-CM | POA: Diagnosis not present

## 2020-12-24 ENCOUNTER — Encounter: Payer: Self-pay | Admitting: Internal Medicine

## 2020-12-24 ENCOUNTER — Other Ambulatory Visit: Payer: Self-pay

## 2020-12-24 ENCOUNTER — Ambulatory Visit (INDEPENDENT_AMBULATORY_CARE_PROVIDER_SITE_OTHER): Payer: Medicare Other | Admitting: Internal Medicine

## 2020-12-24 VITALS — BP 128/72 | HR 71 | Ht 62.0 in | Wt 197.0 lb

## 2020-12-24 DIAGNOSIS — E119 Type 2 diabetes mellitus without complications: Secondary | ICD-10-CM | POA: Diagnosis not present

## 2020-12-24 DIAGNOSIS — F251 Schizoaffective disorder, depressive type: Secondary | ICD-10-CM | POA: Diagnosis not present

## 2020-12-24 DIAGNOSIS — Z794 Long term (current) use of insulin: Secondary | ICD-10-CM

## 2020-12-24 LAB — POCT GLYCOSYLATED HEMOGLOBIN (HGB A1C): Hemoglobin A1C: 6.8 % — AB (ref 4.0–5.6)

## 2020-12-24 MED ORDER — EMPAGLIFLOZIN 10 MG PO TABS: 10.0000 mg | ORAL_TABLET | Freq: Every day | ORAL | 6 refills | Status: DC

## 2020-12-24 NOTE — Patient Instructions (Addendum)
-   Continue Metformin 500 mg, 1 tablets twice daily  - Continue  Rybelsus  14 mg, 1 tablet in the morning  - Decrease Lantus to 42 units Twice a day  - Start Jardiance 10 mg, 1 tablet every morning    -Humalog correctional insulin:Use the scale below to help guide you before each meal   Blood sugar before meal Number of units to inject  Less than 140 0 unit  141 -  160 1 units  161 -  180 2 units  181 -  200 3 units  201 -  220 4 units  221 -  240 5 units  241 -  260 6 units  261 -  280 7 units  281 -  300 8 units  301 - 320 9 units     HOW TO TREAT LOW BLOOD SUGARS (Blood sugar LESS THAN 70 MG/DL) Please follow the RULE OF 15 for the treatment of hypoglycemia treatment (when your (blood sugars are less than 70 mg/dL)   STEP 1: Take 15 grams of carbohydrates when your blood sugar is low, which includes:  3-4 GLUCOSE TABS  OR 3-4 OZ OF JUICE OR REGULAR SODA OR ONE TUBE OF GLUCOSE GEL    STEP 2: RECHECK blood sugar in 15 MINUTES STEP 3: If your blood sugar is still low at the 15 minute recheck --> then, go back to STEP 1 and treat AGAIN with another 15 grams of carbohydrates.

## 2020-12-24 NOTE — Progress Notes (Signed)
Name: Amber Stephenson  Age/ Sex: 75 y.o., female   MRN/ DOB: [id number], [birth year]     PCP: [name], MD   Reason for Endocrinology Evaluation: Type 2 Diabetes Mellitus  Initial Endocrine Consultative Visit: 09/24/2020    PATIENT IDENTIFIER: [name] is a 75 y.o. female with a past medical history of T2DM, HTN, Dyslipidemia, OSA, CHF and A.Fib. The patient has followed with Endocrinology clinic since 09/24/2020 for consultative assistance with management of her diabetes.  DIABETIC HISTORY:  Ms. Leitz was diagnosed with DM in 2007, she has been on basal insulin for years, prandial insulin started 2016. GLP-1 agonists started in 2017. Metformin restarted 2017. Her hemoglobin A1c has ranged from 6.7% in 08/2019, peaking at 10.9% in 04/2020.  Saw Dr. Dorris Fetch in 2016 and Dr. Dwyane Dee in 2017   On her initial visit to our clinic she had an A1c 9.1% . We increased Metformin, Rybelsus and adjusted MDI regimen.   SUBJECTIVE:   During the last visit (09/24/2020): A1c 9.1% We increased Metformin, Rybelsus and adjusted MDI regimen.    Today (12/24/2020): Ms. Micke is here for a follow up on diabetes management.  She checks her blood sugars multiple  times daily through the CGM. Marland Kitchen The patient has not had hypoglycemic episodes since the last clinic visit    She had an admission for CHF 11/2020   HOME DIABETES REGIMEN:  Metformin 500 mg 2 tabs BID- 1 tab  Rybelsus 14 mg daily Lantus 45 units BID Correction factor : Humalog ( BG -120/20)      Statin: yes ACE-I/ARB: no Prior Diabetic Education: no    CONTINUOUS GLUCOSE MONITORING RECORD INTERPRETATION    Dates of Recording: 7/12-7/25/2022  Sensor description: dexcom   Results statistics:   CGM use % of time 71  Average and SD 157/50  Time in range    71    %  % Time Above 180 22  % Time above 250 6  % Time Below target <1     Glycemic patterns summary: optimal glucose at night with post-prandial  hyperglycemia   Hyperglycemic episodes  postprandial   Hypoglycemic episodes occurred overnight   Overnight periods: optimally with occasional hypoglycemia       DIABETIC COMPLICATIONS: Microvascular complications:   Denies: CKD,  retinopathy , neuropathy ( she has this in the charts but denies neuropathy) Last eye exam: Completed 07/2020   Macrovascular complications:   Denies: CAD, PVD, CVA    HISTORY:  Past Medical History:  Past Medical History:  Diagnosis Date   Acute on chronic respiratory failure with hypoxia ([place]) 01/22/2019   Atrial fibrillation with RVR 05/19/2017   Bipolar I disorder 01/23/2015   CAD (coronary artery disease)    Nonobstructive; Managed by Dr. 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 Wallace Keller   RLS (restless legs syndrome) 04/27/2015   Past Surgical History:  Past Surgical History:  Procedure Laterality Date   BREAST REDUCTION SURGERY     EYE SURGERY Right    cateracts   HAMMER TOE SURGERY     LEFT HEART CATH AND CORONARY ANGIOGRAPHY N/A 02/03/2018   Procedure: LEFT HEART CATH AND CORONARY ANGIOGRAPHY;  Surgeon: Martinique, Peter M, MD;  Location: [facility];  Service: Cardiovascular;  Laterality: N/A;    REVERSE SHOULDER ARTHROPLASTY Right 08/19/2019   Procedure: REVERSE SHOULDER ARTHROPLASTY;  Surgeon: Netta Cedars, MD;  Location: [facility];  Service: Orthopedics;  Laterality: Right;  interscalene block   SHOULDER SURGERY Right    "I BROKE MY SHOUDLER   THIGH SURGERY     "TO REMOVE A TUMOR "   Social History:  reports that she has never smoked. She has never used smokeless tobacco. She reports previous alcohol use. She reports that she does not use drugs. Family History:  Family History  Problem Relation Age of Onset   Diabetes Mother    Heart disease Mother    Heart disease Brother    Heart disease Sister        CABG   Diabetes Sister    Alzheimer's disease Father    Alcohol abuse Sister    Mental illness Brother    Diabetes Brother      HOME MEDICATIONS: Allergies as of 12/24/2020       Reactions   Ivp Dye [iodinated Diagnostic Agents] Anaphylaxis, Swelling   Throat closes   Codeine Other (See Comments)   Iodine Other (See Comments)   Latex    Latex tape pulls skin with it        Medication List        Accurate as of December 24, 2020  2:38 PM. If you have any questions, ask your nurse or doctor.          acetaminophen 325 MG tablet Commonly known as: TYLENOL Take 2 tablets (650 mg total) by mouth every 6 (six) hours as needed for mild pain, moderate pain or fever.   albuterol (2.5 MG/3ML) 0.083% nebulizer solution Commonly known as: PROVENTIL Take 3 mLs (2.5 mg total) by nebulization every 6 (six) hours as needed for wheezing or shortness of breath.   albuterol 108 (90 Base) MCG/ACT inhaler Commonly known as: VENTOLIN HFA Inhale 2 puffs into the lungs every 6 (six) hours as needed for wheezing or shortness of breath.   apixaban 5 MG Tabs tablet Commonly known as: Eliquis Take 1 tablet (5 mg total) by mouth 2 (two) times daily.   ARIPiprazole 10 MG tablet Commonly known as: ABILIFY Take 3 tablets (30 mg total) by mouth daily.   atorvastatin 40 MG  tablet Commonly known as: LIPITOR TAKE 1 TABLET DAILY   carbamazepine 100 MG 12 hr capsule Commonly known as: CARBATROL Take by mouth at bedtime.   Dexcom G6 Sensor Misc 1 Device by Does not apply route as directed.   Dexcom G6 Transmitter Misc 1 Device by Does not apply route as directed.   diltiazem 120 MG 24 hr capsule Commonly known as: CARDIZEM CD Take 1 capsule (120 mg total) by mouth at bedtime.   Emgality 120 MG/ML Soaj Generic drug: Galcanezumab-gnlm Administer once a month starting 09/27/2020   empagliflozin 10 MG Tabs tablet Commonly known as: Jardiance Take 1  tablet (10 mg total) by mouth daily before breakfast. Started by: Dorita Sciara, MD   escitalopram 10 MG tablet Commonly known as: Lexapro Take 1 tablet (10 mg total) by mouth daily. What changed: when to take this   fluconazole 150 MG tablet Commonly known as: DIFLUCAN TAKE 1 TABLET(150 MG) BY MOUTH 1 TIME AT ONSET OF SYMPTOMS FOR 1 DOSE. REPEAT AT END OF TREATMENT   flurazepam 30 MG capsule Commonly known as: DALMANE Take 30 mg by mouth at bedtime as needed.   Fluticasone-Salmeterol 500-50 MCG/DOSE Aepb Commonly known as: Advair Diskus Inhale 1 puff into the lungs in the morning and at bedtime.   furosemide 40 MG tablet Commonly known as: LASIX Take 1 tablet (40 mg total) by mouth every morning. TAKE 1 TABLET EVERY MORNING   HYDROmorphone 2 MG tablet Commonly known as: Dilaudid Take 1 tablet (2 mg total) by mouth every 6 (six) hours as needed for severe pain.   icosapent Ethyl 1 g capsule Commonly known as: Vascepa TAKE 2 CAPSULES TWICE A DAY WITH MEALS   insulin lispro 100 UNIT/ML KwikPen Commonly known as: HumaLOG KwikPen SSI changed by endocrine   Lantus SoloStar 100 UNIT/ML Solostar Pen Generic drug: insulin glargine Inject 45 Units into the skin 2 (two) times daily.   LORazepam 1 MG tablet Commonly known as: ATIVAN Take 1 tablet (1 mg total) by mouth 2 (two) times daily.  And give 2 tablets by mouth at bedtime   meclizine 12.5 MG tablet Commonly known as: ANTIVERT Take 1 tablet (12.5 mg total) by mouth 3 (three) times daily as needed for dizziness.   metFORMIN 500 MG tablet Commonly known as: GLUCOPHAGE Take 2 tablets (1,000 mg total) by mouth 2 (two) times daily with a meal. What changed: how much to take   metoprolol succinate 25 MG 24 hr tablet Commonly known as: Toprol XL Take 1 tablet (25 mg total) by mouth in the morning and at bedtime.   metoprolol succinate 100 MG 24 hr tablet Commonly known as: TOPROL-XL Take 1 tablet (100 mg total) by mouth in the morning and at bedtime.   NON FORMULARY Diet: _____ Regular, ___  __ NAS, ___x____Consistent Carbohydrate, _______NPO _____Other   polyethylene glycol 17 g packet Commonly known as: MIRALAX / GLYCOLAX Take 17 g by mouth daily.   potassium chloride SA 20 MEQ tablet Commonly known as: KLOR-CON Take 1 tablet (20 mEq total) by mouth daily.   pregabalin 200 MG capsule Commonly known as: Lyrica Take 1 capsule (200 mg total) by mouth 2 (two) times daily.   Prolia 60 MG/ML Sosy injection Generic drug: denosumab Inject 60 mg into the skin every 6 (six) months.   Rybelsus 14 MG Tabs Generic drug: Semaglutide Take 14 mg by mouth daily.   senna-docusate 8.6-50 MG tablet Commonly known as: Senokot-S Take 2 tablets by mouth at bedtime.   SUMAtriptan 100 MG tablet Commonly known as: IMITREX Take 100 mg by mouth as directed.   tiZANidine 4 MG capsule Commonly known as: ZANAFLEX TAKE 1 CAPSULE BY MOUTH THREE TIMES DAILY AS NEEDED FOR MUSCLE SPASMS What changed: See the new instructions.   traZODone 100 MG tablet Commonly known as: DESYREL Take 400 mg by mouth at bedtime.   Vitamin D (Cholecalciferol) 25 MCG (1000 UT) Tabs Take 1 tablet by mouth daily in the afternoon.         OBJECTIVE:   Vital Signs: BP 128/72 (BP Location: Left Arm, Patient Position: Sitting, Cuff Size:  Normal)   Pulse 71   Ht '5\' 2"'$  (1.575 m)   Wt 197 lb (89.4 kg)   SpO2 97%   BMI 36.03 kg/m   Wt Readings from Last 3 Encounters:  12/24/20 197 lb (89.4 kg)  12/16/20 194 lb 0.1 oz (88 kg)  12/12/20 207 lb (93.9 kg)     Exam: General: Pt appears well and is in NAD  Lungs: Clear with good BS bilat with no rales, rhonchi, or wheezes  Heart: RRR with normal S1 and S2 and no gallops; no murmurs; no rub  Abdomen: Normoactive bowel sounds, soft, nontender, without masses or organomegaly palpable  Extremities: No pretibial edema.   Neuro: MS is good with appropriate affect, pt is alert and Ox3    DM foot exam: 09/24/2020   The skin of the feet is without sores or ulcerations, left 2nd toe deformity  The pedal pulses are 2+ on right and 2+ on left. The sensation is intact to a screening 5.07, 10 gram monofilament bilaterally   DATA REVIEWED:  Lab Results  Component Value Date   HGBA1C 6.8 (A) 12/24/2020   HGBA1C 6.8 (H) 12/13/2020   HGBA1C 6.8 11/14/2020   Lab Results  Component Value Date   MICROALBUR 1.9 07/23/2015   LDLCALC 135 (H) 11/14/2020   CREATININE 0.70 12/16/2020   Lab Results  Component Value Date   MICRALBCREAT <12 08/29/2019     Lab Results  Component Value Date   CHOL 219 (H) 11/14/2020   HDL 47 11/14/2020   LDLCALC 135 (H) 11/14/2020   LDLDIRECT 62 03/17/2016   TRIG 207 (H) 11/14/2020   CHOLHDL 4.7 (H) 11/14/2020         ASSESSMENT / PLAN / RECOMMENDATIONS:   1) Type 2 Diabetes Mellitus, Optimally controlled, Without complications - Most recent A1c of 6.8 %. Goal A1c < 7.0 %.    - I have praised the pt on improved glycemic control  - A1c down from 9.1%  - Discussed adding SGLT-2 inhibitor , given recent admission for CHF. Cautioned against genital infections, she is in agreement   Plan: MEDICATIONS: - Continue Metformin 500 mg, 1 tablets twice daily  - Continue  Rybelsus  14 mg, 1 tablet in the morning  - Decrease Lantus to 42 units Twice  a day  - Start Jardiance 10 mg, 1 tablet every morning  CF : Humalog (BG-120/20)   EDUCATION / INSTRUCTIONS: BG monitoring instructions: Patient is instructed to check her blood sugars 3 times a day, before meals . Call [facility] if: BG persistently < 70 or > 300. I reviewed the Rule of 15 for the treatment of hypoglycemia in detail with the patient. Literature supplied.   2) Diabetic complications:  Eye: Does not have known diabetic retinopathy.  Neuro/ Feet: Does not have known diabetic peripheral neuropathy .  Renal: Patient does not have known baseline CKD. She   is not on an ACEI/ARB at present.   F/U in 4 months     Signed electronically by: Mack Guise, MD  [facility]  [address] [place], [place] [address] Phone: [phone] FAX: [phone]   CC: [name], [place] [address] Phone: [phone]  Fax: [phone]  Return to Endocrinology clinic as below: Future Appointments  Date Time Provider [place]  12/27/2020  2:55 PM [name], MD [facility] None  02/11/2021  1:40 PM Courtney Heys, MD CPR-PR[facility] CPR  02/13/2021  1:55 PM [name], MD [facility] None  02/27/2021  9:30 AM Cameron Sprang, MD LBN-[facility]NG None  02/28/2021  3:00 PM Ahmed Prima Fransisco Hertz, PA-C [facility] [place] [facility]  12/13/2021  1:15 PM [facility]-ANNUAL WELLNESS VISIT [facility] None

## 2020-12-25 ENCOUNTER — Other Ambulatory Visit: Payer: Self-pay | Admitting: *Deleted

## 2020-12-25 ENCOUNTER — Encounter: Payer: Self-pay | Admitting: Internal Medicine

## 2020-12-25 DIAGNOSIS — K591 Functional diarrhea: Secondary | ICD-10-CM

## 2020-12-25 DIAGNOSIS — E119 Type 2 diabetes mellitus without complications: Secondary | ICD-10-CM | POA: Insufficient documentation

## 2020-12-26 DIAGNOSIS — F251 Schizoaffective disorder, depressive type: Secondary | ICD-10-CM | POA: Diagnosis not present

## 2020-12-27 ENCOUNTER — Ambulatory Visit (INDEPENDENT_AMBULATORY_CARE_PROVIDER_SITE_OTHER): Payer: Medicare Other | Admitting: Family Medicine

## 2020-12-27 ENCOUNTER — Other Ambulatory Visit: Payer: Self-pay

## 2020-12-27 ENCOUNTER — Encounter: Payer: Self-pay | Admitting: Family Medicine

## 2020-12-27 VITALS — BP 137/83 | HR 89 | Temp 97.8°F | Ht 62.0 in | Wt 195.4 lb

## 2020-12-27 DIAGNOSIS — R2689 Other abnormalities of gait and mobility: Secondary | ICD-10-CM

## 2020-12-27 DIAGNOSIS — R0600 Dyspnea, unspecified: Secondary | ICD-10-CM | POA: Diagnosis not present

## 2020-12-27 DIAGNOSIS — I5032 Chronic diastolic (congestive) heart failure: Secondary | ICD-10-CM

## 2020-12-27 DIAGNOSIS — D485 Neoplasm of uncertain behavior of skin: Secondary | ICD-10-CM | POA: Diagnosis not present

## 2020-12-27 NOTE — Telephone Encounter (Signed)
Received 2nd refill request for Lomotil. Denies Rx because it is not on pts med list and since it is controlled pt needs to be seen in the office for refill.  [facility] aware today by fax.  Fax [phone]

## 2020-12-27 NOTE — Progress Notes (Signed)
Subjective:  Patient ID: Amber Stephenson, female    DOB: April 23, 1946  Age: 75 y.o. MRN: [id number]  CC: Transitions Of Care   HPI Amber Stephenson presents for recent hospitalization for CHF. Admitted 7-14 to 7/17. Dx was acute on chronic diastolic failure was recorded as the main DC diagnosis per summary of 7/17. (Reviewed.) She also was noted to have hypoxia and acute respiratory failure. She is currently stable. On meds as listed below. Feeling unsteady on her feet since admission. Requests PT to help.    Has a Dexon 6 from endocrine. Treated with multiple doses of insulin daily per endocrinology.   Depression screen [facility] 2/9 12/27/2020 12/12/2020 11/14/2020  Decreased Interest 0 0 0  Down, Depressed, Hopeless 0 0 0  PHQ - 2 Score 0 0 0  Altered sleeping - - -  Tired, decreased energy - - -  Change in appetite - - -  Feeling bad or failure about yourself  - - -  Trouble concentrating - - -  Moving slowly or fidgety/restless - - -  Suicidal thoughts - - -  PHQ-9 Score - - -  Difficult doing work/chores - - -  Some recent data might be hidden    History Amber Stephenson has a past medical history of Acute on chronic respiratory failure with hypoxia ([place]) (01/22/2019), Atrial fibrillation with RVR (05/19/2017), Bipolar I disorder (01/23/2015), CAD (coronary artery disease), Cardiomegaly (01/12/2018), Chronic anticoagulation (05/30/2015), Chronic atrial fibrillation ([place]) (01/04/2015), Chronic back pain, Chronic diastolic CHF (congestive heart failure) (05/30/2015), Diabetes mellitus, type 2, without complication, Diabetic neuropathy (02/06/2016), Dysrhythmia, Essential hypertension, Herpes genitalis in women (07/16/2015), Hyperlipidemia, Hypoxia (02/01/2019), Insomnia (01/23/2015), Lipoma (02/08/2015), Major depressive disorder, Migraine headache with aura (02/12/2016), 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[place]) (04/21/2019), NAFL (nonalcoholic fatty liver) (0/38/8828), OSA (obstructive sleep apnea)  (02/24/2019), Pulmonary hypertension, Renal insufficiency, and RLS (restless legs syndrome) (04/27/2015).   She has a past surgical history that includes THIGH SURGERY; Shoulder surgery (Right); Breast reduction surgery; Eye surgery (Right); Hammer toe surgery; LEFT HEART CATH AND CORONARY ANGIOGRAPHY (N/A, 02/03/2018); and Reverse shoulder arthroplasty (Right, 08/19/2019).   Her family history includes Alcohol abuse in her sister; Alzheimer's disease in her father; Diabetes in her brother, mother, and sister; Heart disease in her brother, mother, and sister; Mental illness in her brother.She reports that she has never smoked. She has never used smokeless tobacco. She reports previous alcohol use. She reports that she does not use drugs.    ROS Review of Systems  Constitutional: Negative.   HENT: Negative.    Eyes:  Negative for visual disturbance.  Respiratory:  Negative for shortness of breath.   Cardiovascular:  Negative for chest pain.  Gastrointestinal:  Negative for abdominal pain.  Musculoskeletal:  Negative for arthralgias.   Objective:  BP 137/83   Pulse 89   Temp 97.8 F (36.6 C)   Ht 5' 2"  (1.575 m)   Wt 195 lb 6.4 oz (88.6 kg)   SpO2 94%   BMI 35.74 kg/m   BP Readings from Last 3 Encounters:  12/27/20 137/83  12/24/20 128/72  12/16/20 (!) 155/87    Wt Readings from Last 3 Encounters:  12/27/20 195 lb 6.4 oz (88.6 kg)  12/24/20 197 lb (89.4 kg)  12/16/20 194 lb 0.1 oz (88 kg)     Physical Exam Constitutional:      General: She is not in acute distress.    Appearance: She is well-developed.  Cardiovascular:     Rate and Rhythm: Normal  rate and regular rhythm.  Pulmonary:     Breath sounds: Normal breath sounds.  Musculoskeletal:        General: Normal range of motion.  Skin:    General: Skin is warm and dry.  Neurological:     Mental Status: She is alert and oriented to person, place, and time.      Assessment & Plan:   Amber Stephenson was seen today for  transitions of care.  Diagnoses and all orders for this visit:  Imbalance -     Ambulatory referral to Physical Therapy  Neoplasm of uncertain behavior of skin -     Ambulatory referral to Dermatology  Dyspnea, unspecified type -     CBC with Differential/Platelet -     CMP14+EGFR -     Pro b natriuretic peptide (BNP)      I am having Amber Stephenson maintain her polyethylene glycol, NON FORMULARY, Vitamin D (Cholecalciferol), albuterol, albuterol, ARIPiprazole, apixaban, escitalopram, Fluticasone-Salmeterol, LORazepam, meclizine, potassium chloride SA, icosapent Ethyl, Prolia, carbamazepine, metoprolol succinate, diltiazem, Dexcom G6 Transmitter, Dexcom G6 Sensor, metFORMIN, Rybelsus, Lantus SoloStar, Emgality, tiZANidine, atorvastatin, insulin lispro, pregabalin, HYDROmorphone, metoprolol succinate, flurazepam, SUMAtriptan, traZODone, furosemide, senna-docusate, acetaminophen, fluconazole, and empagliflozin.  Allergies as of 12/27/2020       Reactions   Ivp Dye [iodinated Diagnostic Agents] Anaphylaxis, Swelling   Throat closes   Codeine Other (See Comments)   Iodine Other (See Comments)   Latex    Latex tape pulls skin with it        Medication List        Accurate as of December 27, 2020 11:59 PM. If you have any questions, ask your nurse or doctor.          acetaminophen 325 MG tablet Commonly known as: TYLENOL Take 2 tablets (650 mg total) by mouth every 6 (six) hours as needed for mild pain, moderate pain or fever.   albuterol (2.5 MG/3ML) 0.083% nebulizer solution Commonly known as: PROVENTIL Take 3 mLs (2.5 mg total) by nebulization every 6 (six) hours as needed for wheezing or shortness of breath.   albuterol 108 (90 Base) MCG/ACT inhaler Commonly known as: VENTOLIN HFA Inhale 2 puffs into the lungs every 6 (six) hours as needed for wheezing or shortness of breath.   apixaban 5 MG Tabs tablet Commonly known as: Eliquis Take 1 tablet (5 mg total) by  mouth 2 (two) times daily.   ARIPiprazole 10 MG tablet Commonly known as: ABILIFY Take 3 tablets (30 mg total) by mouth daily.   atorvastatin 40 MG tablet Commonly known as: LIPITOR TAKE 1 TABLET DAILY   carbamazepine 100 MG 12 hr capsule Commonly known as: CARBATROL Take by mouth at bedtime.   Dexcom G6 Sensor Misc 1 Device by Does not apply route as directed.   Dexcom G6 Transmitter Misc 1 Device by Does not apply route as directed.   diltiazem 120 MG 24 hr capsule Commonly known as: CARDIZEM CD Take 1 capsule (120 mg total) by mouth at bedtime.   Emgality 120 MG/ML Soaj Generic drug: Galcanezumab-gnlm Administer once a month starting 09/27/2020   empagliflozin 10 MG Tabs tablet Commonly known as: Jardiance Take 1 tablet (10 mg total) by mouth daily before breakfast.   escitalopram 10 MG tablet Commonly known as: Lexapro Take 1 tablet (10 mg total) by mouth daily. What changed: when to take this   fluconazole 150 MG tablet Commonly known as: DIFLUCAN TAKE 1 TABLET(150 MG) BY MOUTH 1 TIME AT ONSET  OF SYMPTOMS FOR 1 DOSE. REPEAT AT END OF TREATMENT   flurazepam 30 MG capsule Commonly known as: DALMANE Take 30 mg by mouth at bedtime as needed.   Fluticasone-Salmeterol 500-50 MCG/DOSE Aepb Commonly known as: Advair Diskus Inhale 1 puff into the lungs in the morning and at bedtime.   furosemide 40 MG tablet Commonly known as: LASIX Take 1 tablet (40 mg total) by mouth every morning. TAKE 1 TABLET EVERY MORNING   HYDROmorphone 2 MG tablet Commonly known as: Dilaudid Take 1 tablet (2 mg total) by mouth every 6 (six) hours as needed for severe pain.   icosapent Ethyl 1 g capsule Commonly known as: Vascepa TAKE 2 CAPSULES TWICE A DAY WITH MEALS   insulin lispro 100 UNIT/ML KwikPen Commonly known as: HumaLOG KwikPen SSI changed by endocrine   Lantus SoloStar 100 UNIT/ML Solostar Pen Generic drug: insulin glargine Inject 45 Units into the skin 2 (two) times  daily.   LORazepam 1 MG tablet Commonly known as: ATIVAN Take 1 tablet (1 mg total) by mouth 2 (two) times daily. And give 2 tablets by mouth at bedtime   meclizine 12.5 MG tablet Commonly known as: ANTIVERT Take 1 tablet (12.5 mg total) by mouth 3 (three) times daily as needed for dizziness.   metFORMIN 500 MG tablet Commonly known as: GLUCOPHAGE Take 2 tablets (1,000 mg total) by mouth 2 (two) times daily with a meal. What changed: how much to take   metoprolol succinate 25 MG 24 hr tablet Commonly known as: Toprol XL Take 1 tablet (25 mg total) by mouth in the morning and at bedtime.   metoprolol succinate 100 MG 24 hr tablet Commonly known as: TOPROL-XL Take 1 tablet (100 mg total) by mouth in the morning and at bedtime.   NON FORMULARY Diet: _____ Regular, ___  __ NAS, ___x____Consistent Carbohydrate, _______NPO _____Other   polyethylene glycol 17 g packet Commonly known as: MIRALAX / GLYCOLAX Take 17 g by mouth daily.   potassium chloride SA 20 MEQ tablet Commonly known as: KLOR-CON Take 1 tablet (20 mEq total) by mouth daily.   pregabalin 200 MG capsule Commonly known as: Lyrica Take 1 capsule (200 mg total) by mouth 2 (two) times daily.   Prolia 60 MG/ML Sosy injection Generic drug: denosumab Inject 60 mg into the skin every 6 (six) months.   Rybelsus 14 MG Tabs Generic drug: Semaglutide Take 14 mg by mouth daily.   senna-docusate 8.6-50 MG tablet Commonly known as: Senokot-S Take 2 tablets by mouth at bedtime.   SUMAtriptan 100 MG tablet Commonly known as: IMITREX Take 100 mg by mouth as directed.   tiZANidine 4 MG capsule Commonly known as: ZANAFLEX TAKE 1 CAPSULE BY MOUTH THREE TIMES DAILY AS NEEDED FOR MUSCLE SPASMS What changed: See the new instructions.   traZODone 100 MG tablet Commonly known as: DESYREL Take 400 mg by mouth at bedtime.   Vitamin D (Cholecalciferol) 25 MCG (1000 UT) Tabs Take 1 tablet by mouth daily in the  afternoon.         Follow-up: No follow-ups on file.  [name], M.D.

## 2020-12-28 ENCOUNTER — Other Ambulatory Visit: Payer: Self-pay | Admitting: Adult Health

## 2020-12-28 LAB — CMP14+EGFR
ALT: 16 IU/L (ref 0–32)
AST: 20 IU/L (ref 0–40)
Albumin/Globulin Ratio: 1.4 (ref 1.2–2.2)
Albumin: 4.2 g/dL (ref 3.7–4.7)
Alkaline Phosphatase: 131 IU/L — ABNORMAL HIGH (ref 44–121)
BUN/Creatinine Ratio: 17 (ref 12–28)
BUN: 16 mg/dL (ref 8–27)
Bilirubin Total: 0.3 mg/dL (ref 0.0–1.2)
CO2: 25 mmol/L (ref 20–29)
Calcium: 9.6 mg/dL (ref 8.7–10.3)
Chloride: 99 mmol/L (ref 96–106)
Creatinine, Ser: 0.94 mg/dL (ref 0.57–1.00)
Globulin, Total: 2.9 g/dL (ref 1.5–4.5)
Glucose: 198 mg/dL — ABNORMAL HIGH (ref 65–99)
Potassium: 4.5 mmol/L (ref 3.5–5.2)
Sodium: 148 mmol/L — ABNORMAL HIGH (ref 134–144)
Total Protein: 7.1 g/dL (ref 6.0–8.5)
eGFR: 64 mL/min/{1.73_m2} (ref >59)

## 2020-12-28 LAB — CBC WITH DIFFERENTIAL/PLATELET
Basophils Absolute: 0.1 10*3/uL (ref 0.0–0.2)
Basos: 0 %
EOS (ABSOLUTE): 0.1 10*3/uL (ref 0.0–0.4)
Eos: 1 %
Hematocrit: 41 % (ref 34.0–46.6)
Hemoglobin: 13.7 g/dL (ref 11.1–15.9)
Immature Grans (Abs): 0.1 10*3/uL (ref 0.0–0.1)
Immature Granulocytes: 1 %
Lymphocytes Absolute: 2.5 10*3/uL (ref 0.7–3.1)
Lymphs: 20 %
MCH: 29.7 pg (ref 26.6–33.0)
MCHC: 33.4 g/dL (ref 31.5–35.7)
MCV: 89 fL (ref 79–97)
Monocytes Absolute: 0.7 10*3/uL (ref 0.1–0.9)
Monocytes: 6 %
Neutrophils Absolute: 8.8 10*3/uL — ABNORMAL HIGH (ref 1.4–7.0)
Neutrophils: 72 %
Platelets: 248 10*3/uL (ref 150–450)
RBC: 4.62 x10E6/uL (ref 3.77–5.28)
RDW: 13.9 % (ref 11.7–15.4)
WBC: 12.2 10*3/uL — ABNORMAL HIGH (ref 3.4–10.8)

## 2020-12-28 LAB — PRO B NATRIURETIC PEPTIDE: NT-Pro BNP: 965 pg/mL — ABNORMAL HIGH (ref 0–301)

## 2020-12-31 ENCOUNTER — Encounter: Payer: Self-pay | Admitting: Family Medicine

## 2020-12-31 ENCOUNTER — Telehealth: Payer: Self-pay | Admitting: Family Medicine

## 2020-12-31 NOTE — Telephone Encounter (Signed)
Patient is calling to let you know the name of the [facility] facility where she had asked you to place referral.  It is [facility] in [place].  Their phone number is [phone].  She is aware you are out of the office today.

## 2021-01-01 NOTE — Progress Notes (Signed)
Hello Stephane,  Your lab result is normal and/or stable.Some minor variations that are not significant are commonly marked abnormal, but do not represent any medical problem for you.  Best regards, Trindon Dorton, M.D.

## 2021-01-02 DIAGNOSIS — M5136 Other intervertebral disc degeneration, lumbar region: Secondary | ICD-10-CM | POA: Diagnosis not present

## 2021-01-02 DIAGNOSIS — M47816 Spondylosis without myelopathy or radiculopathy, lumbar region: Secondary | ICD-10-CM | POA: Diagnosis not present

## 2021-01-02 NOTE — Telephone Encounter (Signed)
Yes, it is!

## 2021-01-08 DIAGNOSIS — L84 Corns and callosities: Secondary | ICD-10-CM | POA: Diagnosis not present

## 2021-01-08 DIAGNOSIS — E1142 Type 2 diabetes mellitus with diabetic polyneuropathy: Secondary | ICD-10-CM | POA: Diagnosis not present

## 2021-01-08 DIAGNOSIS — B351 Tinea unguium: Secondary | ICD-10-CM | POA: Diagnosis not present

## 2021-01-08 DIAGNOSIS — M79676 Pain in unspecified toe(s): Secondary | ICD-10-CM | POA: Diagnosis not present

## 2021-01-10 ENCOUNTER — Telehealth: Payer: Self-pay | Admitting: *Deleted

## 2021-01-10 NOTE — Telephone Encounter (Signed)
Amber Stephenson called to get a refill on her hydromorphone.  Her next appt is 02/11/21 and her last fill date with PMP was 11/30/20.

## 2021-01-11 MED ORDER — HYDROMORPHONE HCL 2 MG PO TABS: 2.0000 mg | ORAL_TABLET | Freq: Four times a day (QID) | ORAL | 0 refills | Status: DC | PRN

## 2021-01-14 ENCOUNTER — Ambulatory Visit (HOSPITAL_COMMUNITY)
Admission: RE | Admit: 2021-01-14 | Discharge: 2021-01-14 | Disposition: A | Discharge status: Home / Self Care | Payer: Medicare Other | Source: Ambulatory Visit | Attending: Family Medicine | Admitting: Family Medicine

## 2021-01-14 ENCOUNTER — Ambulatory Visit (INDEPENDENT_AMBULATORY_CARE_PROVIDER_SITE_OTHER): Payer: Medicare Other | Admitting: Nurse Practitioner

## 2021-01-14 ENCOUNTER — Other Ambulatory Visit (HOSPITAL_COMMUNITY): Payer: Self-pay

## 2021-01-14 ENCOUNTER — Other Ambulatory Visit: Payer: Self-pay

## 2021-01-14 ENCOUNTER — Encounter: Payer: Self-pay | Admitting: Nurse Practitioner

## 2021-01-14 ENCOUNTER — Telehealth: Payer: Self-pay | Admitting: Internal Medicine

## 2021-01-14 DIAGNOSIS — Z1231 Encounter for screening mammogram for malignant neoplasm of breast: Secondary | ICD-10-CM

## 2021-01-14 DIAGNOSIS — B379 Candidiasis, unspecified: Secondary | ICD-10-CM | POA: Insufficient documentation

## 2021-01-14 DIAGNOSIS — E0865 Diabetes mellitus due to underlying condition with hyperglycemia: Secondary | ICD-10-CM

## 2021-01-14 DIAGNOSIS — B373 Candidiasis of vulva and vagina: Secondary | ICD-10-CM

## 2021-01-14 DIAGNOSIS — Z794 Long term (current) use of insulin: Secondary | ICD-10-CM

## 2021-01-14 DIAGNOSIS — B3731 Acute candidiasis of vulva and vagina: Secondary | ICD-10-CM

## 2021-01-14 MED ORDER — DEXCOM G6 SENSOR MISC: 1.0000 | 3 refills | Status: DC

## 2021-01-14 MED ORDER — FLUCONAZOLE 150 MG PO TABS: 150.0000 mg | ORAL_TABLET | Freq: Once | ORAL | 0 refills | Status: AC

## 2021-01-14 NOTE — Progress Notes (Signed)
   Virtual Visit  Note Due to COVID-19 pandemic this visit was conducted virtually. This visit type was conducted due to national recommendations for restrictions regarding the COVID-19 Pandemic (e.g. social distancing, sheltering in place) in an effort to limit this patient's exposure and mitigate transmission in our community. All issues noted in this document were discussed and addressed.  A physical exam was not performed with this format.  I connected with Amber Stephenson on 01/14/21 at 10 am by telephone and verified that I am speaking with the correct person using two identifiers. Amber Stephenson is currently located at home during visit. The provider, Ivy Lynn, NP is located in their office at time of visit.  I discussed the limitations, risks, security and privacy concerns of performing an evaluation and management service by telephone and the availability of in person appointments. I also discussed with the patient that there may be a patient responsible charge related to this service. The patient expressed understanding and agreed to proceed.   History and Present Illness:  Vaginal Discharge The patient's primary symptoms include vaginal discharge. The patient's pertinent negatives include no genital itching, genital lesions, genital odor or pelvic pain. This is a recurrent problem. Episode onset: 3 days. The problem has been gradually worsening. The patient is experiencing no pain. She is not pregnant. Pertinent negatives include no abdominal pain, chills, fever, flank pain, frequency or headaches. The vaginal discharge was milky. There has been no bleeding. She has not been passing clots. She has not been passing tissue. Exacerbated by: medication. It is unknown whether or not her partner has an STD. She uses nothing for contraception. She is postmenopausal.     Review of Systems  Constitutional:  Negative for chills and fever.  Gastrointestinal:  Negative for abdominal  pain.  Genitourinary:  Positive for vaginal discharge. Negative for flank pain, frequency and pelvic pain.  Neurological:  Negative for headaches.    Observations/Objective: Tele -visit patient not distress  Assessment and Plan:  Diflucan 150 mg tablet once, follow up with worsening unresolved symptom  Follow Up Instructions:  Follow up as needed    I discussed the assessment and treatment plan with the patient. The patient was provided an opportunity to ask questions and all were answered. The patient agreed with the plan and demonstrated an understanding of the instructions.   The patient was advised to call back or seek an in-person evaluation if the symptoms worsen or if the condition fails to improve as anticipated.  The above assessment and management plan was discussed with the patient. The patient verbalized understanding of and has agreed to the management plan. Patient is aware to call the clinic if symptoms persist or worsen. Patient is aware when to return to the clinic for a follow-up visit. Patient educated on when it is appropriate to go to the emergency department.   Time call ended:  10:08 am   I provided 8 minutes of  non face-to-face time during this encounter.    Ivy Lynn, NP

## 2021-01-14 NOTE — Telephone Encounter (Signed)
Sent to pharmacy 

## 2021-01-14 NOTE — Assessment & Plan Note (Signed)
Diflucan 150 mg tablet once, follow up with worsening unresolved symptom

## 2021-01-14 NOTE — Telephone Encounter (Signed)
Pt is needing a refill on    Continuous Blood Gluc Sensor (DEXCOM G6 SENSOR) MISC  [facility] [phone] - [place], [address] AT [address]

## 2021-01-15 ENCOUNTER — Telehealth: Payer: Self-pay | Admitting: Pharmacy Technician

## 2021-01-15 ENCOUNTER — Telehealth: Payer: Self-pay | Admitting: Family Medicine

## 2021-01-15 NOTE — Telephone Encounter (Signed)
Would you like patient to wait to see if symptoms approve or would you like to call in one more Diflucan for patient to repeat in three days?

## 2021-01-15 NOTE — Telephone Encounter (Signed)
Pt. Needs to be seen for this. Thanks, WS 

## 2021-01-15 NOTE — Telephone Encounter (Addendum)
Patient Advocate Encounter   Received notification from [facility] that prior authorization for Dexcom is required.   PA submitted on 01/15/2021 Key [id number] Status is pending [place] a form to be filled out for [facility]; filled out & left on Dr. Quin Hoop desk to be signed.    [facility] will continue to follow:   Armanda Magic, CPhT Patient Advocate [facility] Phone: [phone] Fax:  [phone]

## 2021-01-15 NOTE — Telephone Encounter (Signed)
Pt is checking to see what dr stacks wants to do about her infection not getting any better

## 2021-01-16 ENCOUNTER — Ambulatory Visit (INDEPENDENT_AMBULATORY_CARE_PROVIDER_SITE_OTHER): Payer: Medicare Other | Admitting: Family Medicine

## 2021-01-16 ENCOUNTER — Encounter: Payer: Self-pay | Admitting: Family Medicine

## 2021-01-16 ENCOUNTER — Other Ambulatory Visit: Payer: Self-pay

## 2021-01-16 VITALS — BP 140/88 | HR 60 | Temp 97.9°F | Ht 62.0 in | Wt 192.8 lb

## 2021-01-16 DIAGNOSIS — B373 Candidiasis of vulva and vagina: Secondary | ICD-10-CM

## 2021-01-16 DIAGNOSIS — N898 Other specified noninflammatory disorders of vagina: Secondary | ICD-10-CM | POA: Diagnosis not present

## 2021-01-16 DIAGNOSIS — L255 Unspecified contact dermatitis due to plants, except food: Secondary | ICD-10-CM | POA: Diagnosis not present

## 2021-01-16 DIAGNOSIS — L718 Other rosacea: Secondary | ICD-10-CM | POA: Diagnosis not present

## 2021-01-16 DIAGNOSIS — B3731 Acute candidiasis of vulva and vagina: Secondary | ICD-10-CM

## 2021-01-16 DIAGNOSIS — L900 Lichen sclerosus et atrophicus: Secondary | ICD-10-CM | POA: Diagnosis not present

## 2021-01-16 LAB — WET PREP FOR TRICH, YEAST, CLUE
Clue Cell Exam: NEGATIVE
Trichomonas Exam: NEGATIVE
Yeast Exam: NEGATIVE

## 2021-01-16 MED ORDER — FLUCONAZOLE 150 MG PO TABS: 150.0000 mg | ORAL_TABLET | Freq: Once | ORAL | 0 refills | Status: DC

## 2021-01-16 NOTE — Progress Notes (Signed)
Assessment & Plan:  1. Vagina itching - fluconazole (DIFLUCAN) 150 MG tablet; Take 1 tablet (150 mg total) by mouth once for 1 dose. May repeat after 3 days if needed.  Dispense: 2 tablet; Refill: 0 - WET PREP FOR TRICH, YEAST, CLUE - educated on using wet wipes for hygiene after voiding while on Jardiance  2. Vaginal yeast infection - WET PREP FOR [place], YEAST, CLUE - educated on using wet wipes for hygiene after voiding while on Jardiance - fluconazole (DIFLUCAN) 150 MG tablet; Take 1 tablet (150 mg total) by mouth once for 1 dose. May repeat after 3 days if needed.  Dispense: 2 tablet; Refill: 0 - printed education on yeast infections provided   Follow up plan: Return if symptoms worsen or fail to improve.  Lucile Crater, NP Student  Subjective:   Patient ID: Amber Stephenson, female    DOB: September 20, 1945, 75 y.o.   MRN: [id number]  HPI: Amber Stephenson is a 75 y.o. female presenting on 01/16/2021 for Vaginal Itching and Vaginal Discharge (X 4 days)  She recently started Jardiance. She has not been using wet wipes to clean herself after voiding.    ROS: Negative unless specifically indicated above in HPI.   Relevant past medical history reviewed and updated as indicated.   Allergies and medications reviewed and updated.   Current Outpatient Medications:    acetaminophen (TYLENOL) 325 MG tablet, Take 2 tablets (650 mg total) by mouth every 6 (six) hours as needed for mild pain, moderate pain or fever., Disp: 12 tablet, Rfl: 0   albuterol (PROVENTIL) (2.5 MG/3ML) 0.083% nebulizer solution, Take 3 mLs (2.5 mg total) by nebulization every 6 (six) hours as needed for wheezing or shortness of breath., Disp: 150 mL, Rfl: 0   albuterol (VENTOLIN HFA) 108 (90 Base) MCG/ACT inhaler, Inhale 2 puffs into the lungs every 6 (six) hours as needed for wheezing or shortness of breath., Disp: 1 each, Rfl: 0   apixaban (ELIQUIS) 5 MG TABS tablet, Take 1 tablet (5 mg total) by mouth 2 (two) times  daily., Disp: 60 tablet, Rfl: 0   ARIPiprazole (ABILIFY) 10 MG tablet, Take 3 tablets (30 mg total) by mouth daily., Disp: 90 tablet, Rfl: 0   atorvastatin (LIPITOR) 40 MG tablet, TAKE 1 TABLET DAILY, Disp: 90 tablet, Rfl: 0   carbamazepine (CARBATROL) 100 MG 12 hr capsule, Take by mouth at bedtime., Disp: , Rfl:    Continuous Blood Gluc Sensor (DEXCOM G6 SENSOR) MISC, 1 Device by Does not apply route as directed., Disp: 9 each, Rfl: 3   Continuous Blood Gluc Transmit (DEXCOM G6 TRANSMITTER) MISC, 1 Device by Does not apply route as directed., Disp: 1 each, Rfl: 3   denosumab (PROLIA) 60 MG/ML SOSY injection, Inject 60 mg into the skin every 6 (six) months., Disp: 1 mL, Rfl: 1   diltiazem (CARDIZEM CD) 120 MG 24 hr capsule, Take 1 capsule (120 mg total) by mouth at bedtime., Disp: 90 capsule, Rfl: 2   empagliflozin (JARDIANCE) 10 MG TABS tablet, Take 1 tablet (10 mg total) by mouth daily before breakfast., Disp: 30 tablet, Rfl: 6   escitalopram (LEXAPRO) 10 MG tablet, Take 1 tablet (10 mg total) by mouth daily., Disp: 30 tablet, Rfl: 0   fluconazole (DIFLUCAN) 150 MG tablet, Take 1 tablet (150 mg total) by mouth once for 1 dose. May repeat after 3 days if needed., Disp: 2 tablet, Rfl: 0   flurazepam (DALMANE) 30 MG capsule, Take 30 mg by  mouth at bedtime as needed., Disp: , Rfl:    Fluticasone-Salmeterol (ADVAIR DISKUS) 500-50 MCG/DOSE AEPB, Inhale 1 puff into the lungs in the morning and at bedtime., Disp: 60 each, Rfl: 0   furosemide (LASIX) 40 MG tablet, Take 1 tablet (40 mg total) by mouth every morning. TAKE 1 TABLET EVERY MORNING, Disp: 30 tablet, Rfl: 3   Galcanezumab-gnlm (EMGALITY) 120 MG/ML SOAJ, Administer once a month starting 09/27/2020, Disp: 1.12 mL, Rfl: 11   HYDROmorphone (DILAUDID) 2 MG tablet, Take 1 tablet (2 mg total) by mouth every 6 (six) hours as needed for severe pain., Disp: 120 tablet, Rfl: 0   icosapent Ethyl (VASCEPA) 1 g capsule, TAKE 2 CAPSULES TWICE A DAY WITH MEALS,  Disp: 120 capsule, Rfl: 0   insulin glargine (LANTUS SOLOSTAR) 100 UNIT/ML Solostar Pen, Inject 45 Units into the skin 2 (two) times daily., Disp: 30 mL, Rfl: 2   insulin lispro (HUMALOG KWIKPEN) 100 UNIT/ML KwikPen, SSI changed by endocrine, Disp: 15 mL, Rfl: 3   LORazepam (ATIVAN) 1 MG tablet, Take 1 tablet (1 mg total) by mouth 2 (two) times daily. And give 2 tablets by mouth at bedtime, Disp: 90 tablet, Rfl: 0   meclizine (ANTIVERT) 12.5 MG tablet, Take 1 tablet (12.5 mg total) by mouth 3 (three) times daily as needed for dizziness., Disp: 30 tablet, Rfl: 0   metFORMIN (GLUCOPHAGE) 500 MG tablet, Take 2 tablets (1,000 mg total) by mouth 2 (two) times daily with a meal., Disp: 360 tablet, Rfl: 3   metoprolol succinate (TOPROL XL) 25 MG 24 hr tablet, Take 1 tablet (25 mg total) by mouth in the morning and at bedtime., Disp: 180 tablet, Rfl: 3   metoprolol succinate (TOPROL-XL) 100 MG 24 hr tablet, Take 1 tablet (100 mg total) by mouth in the morning and at bedtime., Disp: 180 tablet, Rfl: 3   NON FORMULARY, Diet: _____ Regular, ___  __ NAS, ___x____Consistent Carbohydrate, _______NPO _____Other, Disp: , Rfl:    polyethylene glycol (MIRALAX / GLYCOLAX) 17 g packet, Take 17 g by mouth daily., Disp: 14 each, Rfl: 0   potassium chloride SA (KLOR-CON) 20 MEQ tablet, Take 1 tablet (20 mEq total) by mouth daily., Disp: 30 tablet, Rfl: 0   pregabalin (LYRICA) 200 MG capsule, Take 1 capsule (200 mg total) by mouth 2 (two) times daily., Disp: 60 capsule, Rfl: 5   Semaglutide (RYBELSUS) 14 MG TABS, Take 14 mg by mouth daily., Disp: 90 tablet, Rfl: 1   senna-docusate (SENOKOT-S) 8.6-50 MG tablet, Take 2 tablets by mouth at bedtime., Disp: 60 tablet, Rfl: 2   SUMAtriptan (IMITREX) 100 MG tablet, Take 100 mg by mouth as directed., Disp: , Rfl:    tiZANidine (ZANAFLEX) 4 MG capsule, TAKE 1 CAPSULE BY MOUTH THREE TIMES DAILY AS NEEDED FOR MUSCLE SPASMS, Disp: 90 capsule, Rfl: 0   traZODone (DESYREL) 100 MG tablet,  Take 400 mg by mouth at bedtime., Disp: , Rfl:    Vitamin D, Cholecalciferol, 25 MCG (1000 UT) TABS, Take 1 tablet by mouth daily in the afternoon., Disp: , Rfl:  No current facility-administered medications for this visit.  Facility-Administered Medications Ordered in Other Visits:    bupivacaine-epinephrine (MARCAINE W/ EPI) 0.5% -1:200000 injection, , , Anesthesia Intra-op, Effie Berkshire, MD, 12 mL at 01/21/19 0935  Allergies  Allergen Reactions   Ivp Dye [Iodinated Diagnostic Agents] Anaphylaxis and Swelling    Throat closes    Codeine Other (See Comments)   Iodine Other (See Comments)  Latex     Latex tape pulls skin with it    Objective:   BP 140/88   Pulse 60   Temp 97.9 F (36.6 C) (Temporal)   Ht '5\' 2"'$  (1.575 m)   Wt 87.5 kg   BMI 35.26 kg/m    Physical Exam Constitutional:      General: She is not in acute distress.    Appearance: She is obese. She is not ill-appearing, toxic-appearing or diaphoretic.  Eyes:     Extraocular Movements: Extraocular movements intact.     Conjunctiva/sclera: Conjunctivae normal.     Pupils: Pupils are equal, round, and reactive to light.  Pulmonary:     Effort: Pulmonary effort is normal.  Abdominal:     Palpations: Abdomen is soft.  Musculoskeletal:        General: Normal range of motion.     Cervical back: Normal range of motion.  Skin:    General: Skin is warm and dry.  Neurological:     General: No focal deficit present.     Mental Status: She is alert and oriented to person, place, and time. Mental status is at baseline.  Psychiatric:        Mood and Affect: Mood normal.        Behavior: Behavior normal.        Thought Content: Thought content normal.        Judgment: Judgment normal.

## 2021-01-17 ENCOUNTER — Telehealth: Payer: Self-pay | Admitting: Internal Medicine

## 2021-01-17 NOTE — Telephone Encounter (Signed)
Patient is asking for a PA for Dexcom please advise

## 2021-01-21 ENCOUNTER — Other Ambulatory Visit: Payer: Self-pay | Admitting: Family Medicine

## 2021-01-21 NOTE — Telephone Encounter (Signed)
error 

## 2021-01-21 NOTE — Telephone Encounter (Signed)
Pt is stating that she only has one sensor left which is left 6 days. Pt is needing to know the update on this. Pt would like a call back

## 2021-01-21 NOTE — Telephone Encounter (Signed)
Are you working on a PA for this patient please advise

## 2021-01-22 ENCOUNTER — Ambulatory Visit: Payer: Medicare Other | Admitting: Physical Therapy

## 2021-01-22 ENCOUNTER — Other Ambulatory Visit: Payer: Self-pay | Admitting: Family Medicine

## 2021-01-22 DIAGNOSIS — Z79899 Other long term (current) drug therapy: Secondary | ICD-10-CM | POA: Diagnosis not present

## 2021-01-22 DIAGNOSIS — E785 Hyperlipidemia, unspecified: Secondary | ICD-10-CM

## 2021-01-22 DIAGNOSIS — I129 Hypertensive chronic kidney disease with stage 1 through stage 4 chronic kidney disease, or unspecified chronic kidney disease: Secondary | ICD-10-CM | POA: Diagnosis not present

## 2021-01-22 DIAGNOSIS — D631 Anemia in chronic kidney disease: Secondary | ICD-10-CM | POA: Diagnosis not present

## 2021-01-22 DIAGNOSIS — E1169 Type 2 diabetes mellitus with other specified complication: Secondary | ICD-10-CM

## 2021-01-22 DIAGNOSIS — E1122 Type 2 diabetes mellitus with diabetic chronic kidney disease: Secondary | ICD-10-CM | POA: Diagnosis not present

## 2021-01-22 DIAGNOSIS — N189 Chronic kidney disease, unspecified: Secondary | ICD-10-CM | POA: Diagnosis not present

## 2021-01-22 DIAGNOSIS — R809 Proteinuria, unspecified: Secondary | ICD-10-CM | POA: Diagnosis not present

## 2021-01-23 DIAGNOSIS — I129 Hypertensive chronic kidney disease with stage 1 through stage 4 chronic kidney disease, or unspecified chronic kidney disease: Secondary | ICD-10-CM | POA: Diagnosis not present

## 2021-01-23 DIAGNOSIS — N189 Chronic kidney disease, unspecified: Secondary | ICD-10-CM | POA: Diagnosis not present

## 2021-01-23 DIAGNOSIS — R809 Proteinuria, unspecified: Secondary | ICD-10-CM | POA: Diagnosis not present

## 2021-01-23 DIAGNOSIS — E6609 Other obesity due to excess calories: Secondary | ICD-10-CM | POA: Diagnosis not present

## 2021-01-23 DIAGNOSIS — E1122 Type 2 diabetes mellitus with diabetic chronic kidney disease: Secondary | ICD-10-CM | POA: Diagnosis not present

## 2021-01-23 DIAGNOSIS — E1129 Type 2 diabetes mellitus with other diabetic kidney complication: Secondary | ICD-10-CM | POA: Diagnosis not present

## 2021-01-28 NOTE — Telephone Encounter (Signed)
Patient Advocate Encounter   Received notification from [facility] that prior authorization for Dexcom is required.   PA submitted on 01/15/2021 Key [id number] Status is pending [place]VD a form to be filled out for [facility]; filled out & left on Dr. Quin Hoop desk to be signed. Faxed signed form to [facility] 01/21/21 Received a second form and letter that Dexcom had not been approved. Called for clarification. Pt did not have initial PA on file. Filled out the form differently and faxed 57 pages of documentation requested to [employer] [facility] 01/28/21 Spoke with pt today 01/28/21 she is supposed to change her sensor today, but doesn't have another. Will call pt back with advice per Dr. Kelton Pillar, once received.     [facility] will continue to follow:     Armanda Magic, CPhT Patient Advocate [facility] Phone: [phone] Fax:  [phone]

## 2021-01-29 ENCOUNTER — Ambulatory Visit: Payer: Medicare Other | Attending: Orthopedic Surgery | Admitting: Physical Therapy

## 2021-01-30 ENCOUNTER — Other Ambulatory Visit: Payer: Self-pay | Admitting: Family Medicine

## 2021-01-31 ENCOUNTER — Other Ambulatory Visit: Payer: Self-pay

## 2021-01-31 ENCOUNTER — Other Ambulatory Visit: Payer: Self-pay | Admitting: *Deleted

## 2021-01-31 DIAGNOSIS — F251 Schizoaffective disorder, depressive type: Secondary | ICD-10-CM | POA: Diagnosis not present

## 2021-01-31 MED ORDER — EMPAGLIFLOZIN 10 MG PO TABS: 10.0000 mg | ORAL_TABLET | Freq: Every day | ORAL | 1 refills | Status: DC

## 2021-02-04 MED ORDER — TIZANIDINE HCL 4 MG PO CAPS: ORAL_CAPSULE | ORAL | 0 refills | Status: DC

## 2021-02-05 ENCOUNTER — Other Ambulatory Visit (HOSPITAL_COMMUNITY): Payer: Self-pay

## 2021-02-05 NOTE — Telephone Encounter (Signed)
[facility] Patient Advocate Encounter  Prior Authorization for dexcom has been approved.    Received another notification from the pharmacy that this needed a PA. Ran a test claim and it was paid on 01/31/2021.   Called & LVM for pt that it had been filled, in case she hadn't been notified.   [facility] will continue to follow.   Armanda Magic, CPhT Patient Advocate [facility] Phone: [phone] Fax:  [phone]

## 2021-02-07 ENCOUNTER — Other Ambulatory Visit: Payer: Self-pay | Admitting: Family Medicine

## 2021-02-07 ENCOUNTER — Encounter: Payer: Self-pay | Admitting: Family Medicine

## 2021-02-07 ENCOUNTER — Other Ambulatory Visit: Payer: Self-pay

## 2021-02-07 ENCOUNTER — Ambulatory Visit (INDEPENDENT_AMBULATORY_CARE_PROVIDER_SITE_OTHER): Payer: Medicare Other | Admitting: Family Medicine

## 2021-02-07 ENCOUNTER — Ambulatory Visit (INDEPENDENT_AMBULATORY_CARE_PROVIDER_SITE_OTHER): Payer: Medicare Other

## 2021-02-07 VITALS — BP 138/82 | HR 78 | Temp 98.0°F | Ht 62.0 in | Wt 198.0 lb

## 2021-02-07 DIAGNOSIS — B373 Candidiasis of vulva and vagina: Secondary | ICD-10-CM

## 2021-02-07 DIAGNOSIS — I2510 Atherosclerotic heart disease of native coronary artery without angina pectoris: Secondary | ICD-10-CM

## 2021-02-07 DIAGNOSIS — R0609 Other forms of dyspnea: Secondary | ICD-10-CM

## 2021-02-07 DIAGNOSIS — R059 Cough, unspecified: Secondary | ICD-10-CM | POA: Diagnosis not present

## 2021-02-07 DIAGNOSIS — R0600 Dyspnea, unspecified: Secondary | ICD-10-CM | POA: Diagnosis not present

## 2021-02-07 DIAGNOSIS — I517 Cardiomegaly: Secondary | ICD-10-CM | POA: Diagnosis not present

## 2021-02-07 DIAGNOSIS — B3731 Acute candidiasis of vulva and vagina: Secondary | ICD-10-CM

## 2021-02-07 MED ORDER — METFORMIN HCL 500 MG PO TABS: ORAL_TABLET | ORAL | 3 refills | Status: DC

## 2021-02-07 NOTE — Telephone Encounter (Signed)
Pt said yeast infection meds have not been called in yet

## 2021-02-07 NOTE — Patient Instructions (Signed)
Ischemic Stroke An ischemic stroke (cerebrovascular accident, or CVA) is the sudden death of brain tissue that occurs when an area of the brain does not get enough blood flow. This condition is a medical emergency that must be treated right away. An ischemic stroke can cause permanent loss of brain function. Losing brain function can cause problems with how different parts of the body work. What are the causes? This condition is caused by a decrease of blood flow to an area of the brain, which may be the result of: A small blood clot (embolus) or a buildup of plaque in the blood vessels, called atherosclerosis, that blocks blood flow in the brain. An abnormal heart rhythm called atrial fibrillation, which sends a small blood clot to the brain. A blocked or damaged artery in the head or neck. Certain infections. Inflammation of the arteries in the brain (vasculitis). Sometimes, the cause of ischemic stroke is not known. What increases the risk? The following factors may make you more likely to develop this condition: Factors that you can change High blood pressure (hypertension) or certain other medical conditions, such as: Heart disease. Diabetes mellitus. High cholesterol. Obesity. Sleep apnea. Migraine headache. Smoking cigarettes or using other tobacco products. Physical inactivity. Heavy alcohol use. Use of illegal drugs, especially cocaine and methamphetamine. Taking birth control pills, especially if you also use tobacco. Factors that you cannot change Being older than age 67. History of blood clots, stroke, or mini-stroke (transient ischemic attack, TIA). History of high blood pressure when pregnant (preeclampsia), in women. Family history of stroke. Sickle cell disease. Blood clotting disorders (hypercoagulable state). What are the signs or symptoms? Symptoms of this condition usually develop suddenly, or you may notice them after waking from sleep. These sudden symptoms may  include: Weakness or numbness of your face, arm, or leg, especially on one side of your body. Loss of balance or coordination. Slurred speech, or aphasia. Aphasia is trouble speaking or trouble understanding speech or both. Vision changes in one or both eyes. You may have double vision, blurred vision, or loss of vision. Dizziness or confusion. Nausea and vomiting. Severe headache with no known cause. If possible, write down the exact time that you last felt like your normal self and what time your symptoms started. Tell your health care provider. If symptoms come and go, they could be signs of a TIA (transient ischemic attack). Get help right away, even if you feel better. How is this diagnosed? This condition may be diagnosed based on: Your symptoms, your medical history, and a physical exam. CT scan of the brain. MRI. Imaging tests that scan blood flow (circulation) in the brain. These may be CT angiogram, MRI angiogram, or cerebral angiogram. You may need to see a health care provider who specializes in stroke care. A stroke specialist can be seen in person or through communication using telephone or television technology (telemedicine). You may also have other tests, including: Electrocardiogram (ECG). Continuous heart monitoring. Transthoracic echocardiogram (TTE). Transesophageal echocardiogram (TEE). Carotid ultrasound. Blood tests. Sleep study to check for sleep apnea. How is this treated? Treatment for this condition depends on the duration, severity, and cause of your symptoms and on the area of the brain affected. It is very important to get treatment at the first sign of stroke symptoms. Some treatments work better if they are done within 3-6 hours of the start of stroke symptoms. These initial treatments may include: Thrombolytic medicine that is injected to dissolve the blood clot. Treatments given directly  to the affected artery to remove or dissolve the blood  clot. Medicines to control blood pressure. Anticoagulant or antiplatelet medicines to thin the blood. Other treatments may include: Oxygen. IV fluids. Procedures to increase blood flow. Medicines and diet changes may be used to help manage risk factors for stroke, such as diabetes, high cholesterol, and high blood pressure. After a stroke, you may work with physical, speech, mental health, or occupational therapists to help you recover. Follow these instructions at home: Medicines Take over-the-counter and prescription medicines only as told by your health care provider. If you were told to take a medicine to thin your blood, take your medicine exactly as told, at the same time every day. This includes aspirin or an anticoagulant. Taking too much blood-thinning medicine can cause bleeding. If you do not take enough blood-thinning medicine, you will not be protected enough against another stroke and other problems. Understand the side effects of taking anticoagulant medicine. When taking this type of medicine, make sure you: Hold pressure over any cuts for longer than usual. Tell your dentist and other health care providers that you are taking anticoagulants before you have any procedures that may cause bleeding. Avoid activities that could cause injury or bruising. Wear a medical alert bracelet or carry a card that lists what medicines you take. Eating and drinking Follow instructions from your health care provider about diet. Eat healthy foods. If your stroke affected your ability to swallow, you may need to take steps to avoid choking. These may include taking small bites when eating and eating foods that are soft or pureed. Safety Follow instructions from your health care team about physical activity. Use a walker or cane as told by your health care provider. Take steps to create a safe home environment to lower your risk of falls. These steps may include: Having your home looked at by  specialists. Installing grab bars in the bedroom and bathroom. Using safety equipment, such as raised toilets and a seat in the shower. General instructions Do not use any products that contain nicotine or tobacco, such as cigarettes, e-cigarettes, and chewing tobacco. If you need help quitting, ask your health care provider. If you drink alcohol: Limit how much you use to: 0-1 drink a day for women. 0-2 drinks a day for men. Be aware of how much alcohol is in your drink. In the [place] one drink equals one 12 oz bottle of beer (355 mL), one 5 oz glass of wine (148 mL), or one 1 oz glass of hard liquor (44 mL). If you need help to stop using drugs or alcohol, ask your health care provider about a referral to a program or specialist. Maintain an active and healthy lifestyle. Get regular exercise as told. Wear a medical bracelet as told by your health care provider. Keep all follow-up visits as told by your health care provider, including visits with all specialists on your health care team. This is important. How is this prevented? You can lower your risk of another stroke by managing high blood pressure, high cholesterol, diabetes, heart disease, sleep apnea, and obesity. Your risk can also be lowered by quitting smoking, limiting alcohol, and staying physically active. Your health care provider will continue to help you with ways to prevent short-term and long-term problems caused by stroke. Get help right away if: You have any symptoms of a stroke. "BE FAST" is an easy way to remember the main warning signs of a stroke: B - Balance. Signs are dizziness,  sudden trouble walking, or loss of balance. E - Eyes. Signs are trouble seeing or a sudden change in vision. F - Face. Signs are sudden weakness or numbness of the face, or the face or eyelid drooping on one side. A - Arms. Signs are weakness or numbness in an arm. This happens suddenly and usually on one side of the body. S - Speech. Signs  are sudden trouble speaking, slurred speech, or trouble understanding what people say. T - Time. Time to call emergency services. Write down what time symptoms started. You have other signs of a stroke, such as: A sudden, severe headache with no known cause. Nausea or vomiting. Seizure. These symptoms may represent a serious problem that is an emergency. Do not wait to see if the symptoms will go away. Get medical help right away. Call your local emergency services (911 in the [place] Do not drive yourself to the hospital. Summary An ischemic stroke (cerebrovascular accident, or CVA) is the sudden death of brain tissue that occurs when an area of the brain does not get enough blood flow. Symptoms of this condition usually develop suddenly, or you may notice them after waking from sleep. It is very important to get treatment at the first sign of stroke symptoms. Stroke is a medical emergency that must be treated right away. This information is not intended to replace advice given to you by your health care provider. Make sure you discuss any questions you have with your health care provider. Document Revised: 05/16/2019 Document Reviewed: 05/16/2019 Elsevier Patient Education  2021 [employer].

## 2021-02-07 NOTE — Progress Notes (Signed)
Subjective:  Patient ID: Amber Stephenson, female    DOB: 1945-06-18  Age: 75 y.o. MRN: [id number]  CC: Back Pain   HPI Amber Stephenson presents for vaginal yeast. Discharge and itching. Ongoing for a few days. Recurrent.   Concerned about stroke symptoms. Wants information regarding what to look for.  Pt. Has a new back pain points to the left  T12 area.   Depression screen Amber Stephenson 2/9 02/07/2021 01/16/2021 12/27/2020  Decreased Interest 0 0 0  Down, Depressed, Hopeless 0 0 0  PHQ - 2 Score 0 0 0  Altered sleeping - - -  Tired, decreased energy - - -  Change in appetite - - -  Feeling bad or failure about yourself  - - -  Trouble concentrating - - -  Moving slowly or fidgety/restless - - -  Suicidal thoughts - - -  PHQ-9 Score - - -  Difficult doing work/chores - - -  Some recent data might be hidden    History Amber Stephenson has a past medical history of Acute on chronic respiratory failure with hypoxia ([place]) (01/22/2019), Atrial fibrillation with RVR (05/19/2017), Bipolar I disorder (01/23/2015), CAD (coronary artery disease), Cardiomegaly (01/12/2018), Chronic anticoagulation (05/30/2015), Chronic atrial fibrillation ([place]) (01/04/2015), Chronic back pain, Chronic diastolic CHF (congestive heart failure) (05/30/2015), Diabetes mellitus, type 2, without complication, Diabetic neuropathy (02/06/2016), Dysrhythmia, Essential hypertension, Herpes genitalis in women (07/16/2015), Hyperlipidemia, Hypoxia (02/01/2019), Insomnia (01/23/2015), Lipoma (02/08/2015), Major depressive disorder, Migraine headache with aura (02/12/2016), 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[place]) (04/21/2019), NAFL (nonalcoholic fatty liver) (123XX123), OSA (obstructive sleep apnea) (02/24/2019), Pulmonary hypertension, Renal insufficiency, and RLS (restless legs syndrome) (04/27/2015).   She has a past surgical history that includes THIGH SURGERY; Shoulder surgery (Right); Breast reduction surgery; Eye surgery (Right); Hammer toe  surgery; LEFT HEART CATH AND CORONARY ANGIOGRAPHY (N/A, 02/03/2018); and Reverse shoulder arthroplasty (Right, 08/19/2019).   Her family history includes Alcohol abuse in her sister; Alzheimer's disease in her father; Diabetes in her brother, mother, and sister; Heart disease in her brother, mother, and sister; Mental illness in her brother.She reports that she has never smoked. She has never used smokeless tobacco. She reports that she does not currently use alcohol. She reports that she does not use drugs.    ROS Review of Systems  Constitutional: Negative.   HENT: Negative.    Eyes:  Negative for visual disturbance.  Respiratory:  Negative for shortness of breath.   Cardiovascular:  Negative for chest pain.  Gastrointestinal:  Negative for abdominal pain.  Musculoskeletal:  Negative for arthralgias.   Objective:  BP 138/82   Pulse 78   Temp 98 F (36.7 C) (Temporal)   Ht '5\' 2"'$  (1.575 m)   Wt 198 lb (89.8 kg)   SpO2 92%   BMI 36.21 kg/m   BP Readings from Last 3 Encounters:  02/07/21 138/82  01/16/21 140/88  12/27/20 137/83    Wt Readings from Last 3 Encounters:  02/07/21 198 lb (89.8 kg)  01/16/21 192 lb 12.8 oz (87.5 kg)  12/27/20 195 lb 6.4 oz (88.6 kg)     Physical Exam Constitutional:      General: She is not in acute distress.    Appearance: She is well-developed.  Cardiovascular:     Rate and Rhythm: Normal rate and regular rhythm.  Pulmonary:     Breath sounds: Normal breath sounds.  Musculoskeletal:        General: Normal range of motion.  Skin:    General: Skin is  warm and dry.  Neurological:     Mental Status: She is alert and oriented to person, place, and time.      Assessment & Plan:   Amber Stephenson was seen today for back pain.  Diagnoses and all orders for this visit:  Other form of dyspnea -     DG Chest 2 View; Future  Other orders -     metFORMIN (GLUCOPHAGE) 500 MG tablet; TAKE 1 TABLET(500 MG) BY MOUTH TWICE DAILY AFTER A  MEAL      I am having Amber Stephenson maintain her polyethylene glycol, NON FORMULARY, Vitamin D (Cholecalciferol), albuterol, albuterol, ARIPiprazole, apixaban, escitalopram, Fluticasone-Salmeterol, LORazepam, meclizine, potassium chloride SA, icosapent Ethyl, Prolia, carbamazepine, metoprolol succinate, diltiazem, Dexcom G6 Transmitter, Rybelsus, Lantus SoloStar, Emgality, insulin lispro, pregabalin, metoprolol succinate, flurazepam, SUMAtriptan, traZODone, furosemide, Amber-docusate, acetaminophen, HYDROmorphone, Dexcom G6 Sensor, atorvastatin, tiZANidine, empagliflozin, and metFORMIN.  Allergies as of 02/07/2021       Reactions   Ivp Dye [iodinated Diagnostic Agents] Anaphylaxis, Swelling   Throat closes   Codeine Other (See Comments)   Iodine Other (See Comments)   Latex    Latex tape pulls skin with it        Medication List        Accurate as of February 07, 2021 11:59 PM. If you have any questions, ask your nurse or doctor.          acetaminophen 325 MG tablet Commonly known as: TYLENOL Take 2 tablets (650 mg total) by mouth every 6 (six) hours as needed for mild pain, moderate pain or fever.   albuterol (2.5 MG/3ML) 0.083% nebulizer solution Commonly known as: PROVENTIL Take 3 mLs (2.5 mg total) by nebulization every 6 (six) hours as needed for wheezing or shortness of breath.   albuterol 108 (90 Base) MCG/ACT inhaler Commonly known as: VENTOLIN HFA Inhale 2 puffs into the lungs every 6 (six) hours as needed for wheezing or shortness of breath.   apixaban 5 MG Tabs tablet Commonly known as: Eliquis Take 1 tablet (5 mg total) by mouth 2 (two) times daily.   ARIPiprazole 10 MG tablet Commonly known as: ABILIFY Take 3 tablets (30 mg total) by mouth daily.   atorvastatin 40 MG tablet Commonly known as: LIPITOR TAKE 1 TABLET DAILY   carbamazepine 100 MG 12 hr capsule Commonly known as: CARBATROL Take by mouth at bedtime.   Dexcom G6 Sensor Misc 1  Device by Does not apply route as directed.   Dexcom G6 Transmitter Misc 1 Device by Does not apply route as directed.   diltiazem 120 MG 24 hr capsule Commonly known as: CARDIZEM CD Take 1 capsule (120 mg total) by mouth at bedtime.   Emgality 120 MG/ML Soaj Generic drug: Galcanezumab-gnlm Administer once a month starting 09/27/2020   empagliflozin 10 MG Tabs tablet Commonly known as: Jardiance Take 1 tablet (10 mg total) by mouth daily before breakfast.   escitalopram 10 MG tablet Commonly known as: Lexapro Take 1 tablet (10 mg total) by mouth daily.   fluconazole 150 MG tablet Commonly known as: DIFLUCAN TAKE 1 TABLET(150 MG) BY MOUTH 1 TIME FOR 1 DOSE. MAY REPEAT AFTER 3 DAYS AS NEEDED What changed: See the new instructions. Changed by: Loman Brooklyn, FNP   flurazepam 30 MG capsule Commonly known as: DALMANE Take 30 mg by mouth at bedtime as needed.   Fluticasone-Salmeterol 500-50 MCG/DOSE Aepb Commonly known as: Advair Diskus Inhale 1 puff into the lungs in the morning and at bedtime.  furosemide 40 MG tablet Commonly known as: LASIX Take 1 tablet (40 mg total) by mouth every morning. TAKE 1 TABLET EVERY MORNING   HYDROmorphone 2 MG tablet Commonly known as: Dilaudid Take 1 tablet (2 mg total) by mouth every 6 (six) hours as needed for severe pain.   icosapent Ethyl 1 g capsule Commonly known as: Vascepa TAKE 2 CAPSULES TWICE A DAY WITH MEALS   insulin lispro 100 UNIT/ML KwikPen Commonly known as: HumaLOG KwikPen SSI changed by endocrine   Lantus SoloStar 100 UNIT/ML Solostar Pen Generic drug: insulin glargine Inject 45 Units into the skin 2 (two) times daily.   LORazepam 1 MG tablet Commonly known as: ATIVAN Take 1 tablet (1 mg total) by mouth 2 (two) times daily. And give 2 tablets by mouth at bedtime   meclizine 12.5 MG tablet Commonly known as: ANTIVERT Take 1 tablet (12.5 mg total) by mouth 3 (three) times daily as needed for dizziness.    metFORMIN 500 MG tablet Commonly known as: GLUCOPHAGE TAKE 1 TABLET(500 MG) BY MOUTH TWICE DAILY AFTER A MEAL   metoprolol succinate 25 MG 24 hr tablet Commonly known as: Toprol XL Take 1 tablet (25 mg total) by mouth in the morning and at bedtime.   metoprolol succinate 100 MG 24 hr tablet Commonly known as: TOPROL-XL Take 1 tablet (100 mg total) by mouth in the morning and at bedtime.   NON FORMULARY Diet: _____ Regular, ___  __ NAS, ___x____Consistent Carbohydrate, _______NPO _____Other   polyethylene glycol 17 g packet Commonly known as: MIRALAX / GLYCOLAX Take 17 g by mouth daily.   potassium chloride SA 20 MEQ tablet Commonly known as: KLOR-CON Take 1 tablet (20 mEq total) by mouth daily.   pregabalin 200 MG capsule Commonly known as: Lyrica Take 1 capsule (200 mg total) by mouth 2 (two) times daily.   Prolia 60 MG/ML Sosy injection Generic drug: denosumab Inject 60 mg into the skin every 6 (six) months.   Rybelsus 14 MG Tabs Generic drug: Semaglutide Take 14 mg by mouth daily.   Amber-docusate 8.6-50 MG tablet Commonly known as: Senokot-S Take 2 tablets by mouth at bedtime.   SUMAtriptan 100 MG tablet Commonly known as: IMITREX Take 100 mg by mouth as directed.   tiZANidine 4 MG capsule Commonly known as: ZANAFLEX TAKE 1 CAPSULE BY MOUTH THREE TIMES DAILY AS NEEDED FOR MUSCLE SPASMS   traZODone 100 MG tablet Commonly known as: DESYREL Take 400 mg by mouth at bedtime.   Vitamin D (Cholecalciferol) 25 MCG (1000 UT) Tabs Take 1 tablet by mouth daily in the afternoon.         Follow-up: Return in about 3 months (around 05/09/2021).  [name], M.D.

## 2021-02-11 ENCOUNTER — Encounter
Payer: Medicare Other | Attending: Physical Medicine and Rehabilitation | Admitting: Physical Medicine and Rehabilitation

## 2021-02-11 ENCOUNTER — Other Ambulatory Visit: Payer: Self-pay

## 2021-02-11 ENCOUNTER — Encounter: Payer: Self-pay | Admitting: Physical Medicine and Rehabilitation

## 2021-02-11 VITALS — BP 109/74 | HR 65 | Temp 97.8°F | Ht 62.0 in | Wt 194.2 lb

## 2021-02-11 DIAGNOSIS — M7918 Myalgia, other site: Secondary | ICD-10-CM | POA: Diagnosis not present

## 2021-02-11 DIAGNOSIS — Z79891 Long term (current) use of opiate analgesic: Secondary | ICD-10-CM | POA: Insufficient documentation

## 2021-02-11 DIAGNOSIS — G894 Chronic pain syndrome: Secondary | ICD-10-CM | POA: Diagnosis not present

## 2021-02-11 DIAGNOSIS — Z5181 Encounter for therapeutic drug level monitoring: Secondary | ICD-10-CM | POA: Insufficient documentation

## 2021-02-11 MED ORDER — HYDROMORPHONE HCL 2 MG PO TABS: 2.0000 mg | ORAL_TABLET | Freq: Four times a day (QID) | ORAL | 0 refills | Status: DC | PRN

## 2021-02-11 NOTE — Progress Notes (Signed)
Patient is a 75 yr old female with hx of  CAD, pulmonary HTN- and DM- with CKD- Stage III here for right low back pain with sciatica  As well as upper back/neck pain/myofascial pain. Pt is here for f/u on chronic pain  Hasn't noticed any improvement with increase back in Lyrica- 200 mg BID.  Still doing Dilaudid 2 mg q6 hours Still taking Zanaflex- 4 mg TID- not every day-~ 4 days/week.   Felt like trigger point injections were helpful last time- focused on neck last time-  Lasted 1 week- til back as bad as it was.   Still really hurts Pain has shifted to the middle of her back on L-  No inciting factors- no falls or near falls- just started 1 morning and hasn't let up.   Using theracane "constantly" every day- not helping "at all". Canot find anything that helps.      Exam: Awake, alert, appropriate, accompanied by husband, NAD Very tight trigger point/palpable muscle spasm on L mid ot lower thoracic area- just below bra strap on L.  Also tight on scalenes, and splenius capitus and levators on L- upper trap is looser as well as L pec.    Plan:  Will refer to Dr Hulan Saas - for OMT- wondering if has a lower posterior L rib out of place?-  2.  Con't Dilaudid and Lyrica- Lyrica has refills 3. Will refill Dliaudid 2 mg q6 hours prn- #120- is due for refill- placed refill.   4. Patient here for trigger point injections for  Consent done and on chart.  Cleaned areas with alcohol and injected using a 27 gauge 1.5 inch needle  Injected 3cc Using 1% Lidocaine with no EPI  Upper traps L only Levators L  Posterior scalenes L only Middle scalenes L only Splenius Capitus- L only Pectoralis Major Rhomboids L only Infraspinatus Teres Major/minor Thoracic paraspinals- L only x3 Lumbar paraspinals Other injections-    Patient's level of pain prior was 7-8/10.  Current level of pain after injections is 4/10- fast relief this time.   There was no bleeding or  complications.  Patient was advised to drink a lot of water on day after injections to flush system Will have increased soreness for 12-48 hours after injections.  Can use Lidocaine patches the day AFTER injections Can use theracane on day of injections in places didn't inject Can use heating pad 4-6 hours AFTER injections Felt dizzy right afterwards again, but improved with laying back 1-2 minutes.   5.F/U in 2 months. For trIgger point injections. And f/u.

## 2021-02-11 NOTE — Patient Instructions (Signed)
Plan:  Will refer to Dr Hulan Saas - for OMT- wondering if has a lower posterior L rib out of place?-  2.  Con't Dilaudid and Lyrica- Lyrica has refills 3. Will refill Dliaudid 2 mg q6 hours prn- #120- is due for refill- placed refill.   4. Patient here for trigger point injections for  Consent done and on chart.  Cleaned areas with alcohol and injected using a 27 gauge 1.5 inch needle  Injected 3cc Using 1% Lidocaine with no EPI  Upper traps L only Levators L  Posterior scalenes L only Middle scalenes L only Splenius Capitus- L only Pectoralis Major Rhomboids L only Infraspinatus Teres Major/minor Thoracic paraspinals- L only x3 Lumbar paraspinals Other injections-    Patient's level of pain prior was 7-8/10.  Current level of pain after injections is 4/10- fast relief this time.   There was no bleeding or complications.  Patient was advised to drink a lot of water on day after injections to flush system Will have increased soreness for 12-48 hours after injections.  Can use Lidocaine patches the day AFTER injections Can use theracane on day of injections in places didn't inject Can use heating pad 4-6 hours AFTER injections Felt dizzy right afterwards again, but improved with laying back 1-2 minutes.   5.F/U in 66month. For trIgger point injections. And f/u.

## 2021-02-13 ENCOUNTER — Ambulatory Visit: Payer: Medicare Other | Admitting: Family Medicine

## 2021-02-14 ENCOUNTER — Other Ambulatory Visit: Payer: Self-pay

## 2021-02-14 ENCOUNTER — Encounter: Payer: Self-pay | Admitting: Physical Therapy

## 2021-02-14 ENCOUNTER — Ambulatory Visit: Payer: Medicare Other | Attending: Orthopedic Surgery | Admitting: Physical Therapy

## 2021-02-14 DIAGNOSIS — M6281 Muscle weakness (generalized): Secondary | ICD-10-CM | POA: Diagnosis not present

## 2021-02-14 DIAGNOSIS — R2689 Other abnormalities of gait and mobility: Secondary | ICD-10-CM

## 2021-02-14 DIAGNOSIS — G8929 Other chronic pain: Secondary | ICD-10-CM

## 2021-02-14 DIAGNOSIS — M545 Low back pain, unspecified: Secondary | ICD-10-CM | POA: Diagnosis not present

## 2021-02-14 DIAGNOSIS — R2681 Unsteadiness on feet: Secondary | ICD-10-CM | POA: Diagnosis not present

## 2021-02-14 DIAGNOSIS — F251 Schizoaffective disorder, depressive type: Secondary | ICD-10-CM | POA: Diagnosis not present

## 2021-02-15 NOTE — Therapy (Signed)
[facility] [address] [place], [place], [address] Phone: [phone]   Fax:  [phone]  Physical Therapy Evaluation  Patient Details  Name: Amber Stephenson MRN: [id number] Date of Birth: [birth year] Referring Provider (PT): Netta Cedars, MD   Encounter Date: 02/14/2021   PT End of Session - 02/15/21 1532     Visit Number 1    Number of Visits 5    Date for PT Re-Evaluation 03/15/21    Authorization Type Medicare    Authorization Time Period 02-14-21 - 03-16-21    PT Start Time 1450    PT Stop Time 1523    PT Time Calculation (min) 33 min    Activity Tolerance Patient limited by pain    Behavior During Therapy Restless;Agitated             Past Medical History:  Diagnosis Date   Acute on chronic respiratory failure with hypoxia ([place]) 01/22/2019   Atrial fibrillation with RVR 05/19/2017   Bipolar I disorder 01/23/2015   CAD (coronary artery disease)    Nonobstructive; Managed by Dr. Bronson Ing   Cardiomegaly 01/12/2018   Chronic anticoagulation 05/30/2015   Chronic atrial fibrillation ([place]) 01/04/2015   Chronic back pain    Lower back   Chronic diastolic CHF (congestive heart failure) 05/30/2015   Diabetes mellitus, type 2, without complication    Diabetic neuropathy 02/06/2016   Dysrhythmia    A-Fib   Essential hypertension    Herpes genitalis in women 07/16/2015   Hyperlipidemia    Hypoxia 02/01/2019   Insomnia 01/23/2015   Lipoma 02/08/2015   Major depressive disorder    Migraine headache with aura 02/12/2016   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[place]) 04/21/2019   NAFL (nonalcoholic fatty liver) 123XX123   OSA (obstructive sleep apnea) 02/24/2019   10/09/2018 - HST  - AHI 40.6    Pulmonary hypertension    Renal insufficiency    Managed by Dr. Wallace Keller   RLS (restless legs syndrome) 04/27/2015    Past Surgical History:  Procedure Laterality Date   BREAST REDUCTION SURGERY     EYE SURGERY Right     cateracts   HAMMER TOE SURGERY     LEFT HEART CATH AND CORONARY ANGIOGRAPHY N/A 02/03/2018   Procedure: LEFT HEART CATH AND CORONARY ANGIOGRAPHY;  Surgeon: Martinique, Peter M, MD;  Location: [facility];  Service: Cardiovascular;  Laterality: N/A;   REVERSE SHOULDER ARTHROPLASTY Right 08/19/2019   Procedure: REVERSE SHOULDER ARTHROPLASTY;  Surgeon: Netta Cedars, MD;  Location: [facility];  Service: Orthopedics;  Laterality: Right;  interscalene block   SHOULDER SURGERY Right    "I BROKE MY SHOUDLER   THIGH SURGERY     "TO REMOVE A TUMOR "    There were no vitals filed for this visit.    Subjective Assessment - 02/14/21 1441     Subjective Pt presents to PT evaluation with c/o chronic low back and c/o Rt ankle pain (unknown etiology) - states she sees a podiatrist; ankle pain has been going on for approx. 5 months - states it hurts mostly in the morning; pt saw Dr. Dagoberto Ligas yesterday for chronic pain syndrome - she is referring her to see Dr. Hulan Saas because she thinks she may have a Lt lower rib out of place:    Pertinent History CAD, Chronic pain syndrome, bipolar disorder, fibromyalgia, chronic respiratory failure with hypoxia, DM type II, diabetic neuropathy, pulmonary HTN, RLS, HTN    How long can you  sit comfortably? no problems with sitting    How long can you stand comfortably? 3"-4"    How long can you walk comfortably? unable    Patient Stated Goals pt able to stand longer, exercise, and decrease pain    Currently in Pain? Yes    Pain Score 7     Pain Location Back    Pain Orientation Lower    Pain Descriptors / Indicators Aching;Throbbing;Discomfort;Sharp    Pain Type Chronic pain    Pain Onset More than a month ago    Pain Frequency Constant    Aggravating Factors  standing & walking    Pain Relieving Factors heat, stretching                [facility] PT Assessment - 02/15/21 0001       Assessment   Medical Diagnosis Lumbar Spondylosis    Referring Provider  (PT) Netta Cedars, MD    Onset Date/Surgical Date --   approx. 2 months ago for pain on Lt side of back: Referral date 01-11-21   Prior Therapy pt had OP PT in [place] in April 2020      Precautions   Precautions Fall      Restrictions   Weight Bearing Restrictions No      Balance Screen   Has the patient fallen in the past 6 months No    Has the patient had a decrease in activity level because of a fear of falling?  No    Is the patient reluctant to leave their home because of a fear of falling?  No      Home Environment   Living Environment Private residence    Type of [place] to enter    Entrance Stairs-Number of Steps 5    Entrance Stairs-Rails Right    Home Layout Two level;Able to live on main level with bedroom/bathroom    Additional Comments has walker and a cane but does not want to use either      Prior Function   Level of Independence Independent with basic ADLs;Needs assistance with homemaking;Independent with household mobility without device;Independent with community mobility without device    Leisure pt is involved with church activities      Observation/Other Assessments   Focus on Therapeutic Outcomes (FOTO)  N/A for pt's diagnosis      ROM / Strength   AROM / PROM / Strength Strength      Strength   Overall Strength Unable to assess;Due to pain      Transfers   Transfers Sit to Stand;Stand to Sit    Five time sit to stand comments  pt declined to attempt due to c/o pain at time of eval    Comments has pain with the transfer      Ambulation/Gait   Ambulation/Gait Yes    Ambulation Distance (Feet) 50 Feet    Assistive device None    Ambulation Surface Level;Indoor    Gait Comments pt declined walking due to severity of back pain                        Objective measurements completed on examination: See above findings.                PT Education - 02/15/21 1531     Education Details unable to  complete objective tests during eval due to pt's c/o pain and requesting to stop  with plan to complete in next session    Person(s) Educated Patient    Methods Explanation    Comprehension Verbalized understanding              PT Short Term Goals - 02/15/21 1544       PT SHORT TERM GOAL #1   Title same as LTG's               PT Long Term Goals - 02/15/21 1544       PT LONG TERM GOAL #1   Title Patient will be independent with HEP.    Time 4    Period Weeks    Status New    Target Date 03/15/21      PT LONG TERM GOAL #2   Title Pt will improve TUG score by at least 4 secs to demo improved functional mobility.    Time 4    Period Weeks    Status New    Target Date 03/15/21      PT LONG TERM GOAL #3   Title Pt will report at least 25% improvement in back pain to increase ease with ADL's and mobility.    Time 4    Period Weeks    Status New    Target Date 03/15/21      PT LONG TERM GOAL #4   Title Pt will amb. 83' with SPC with c/o back pain </= 5/10 to increase independence & safety with community mobility.    Baseline 51' with SPC on 02-14-21 with pain 8/10 intensity    Time 4    Period Weeks    Status New    Target Date 03/15/21      PT LONG TERM GOAL #5   Title Increase gait velocity by at least .4 ft/sec with use of SPC for incr. gait efficiency.    Time 4    Period Weeks    Status New    Target Date 03/15/21                    Plan - 02/15/21 1534     Clinical Impression Statement Pt is a 75 yr old lady with c/o moderate to severe back pain during today's evaluation; pt presents with chronic pain syndrome, lumbar spondylosis, c/o Rt ankle pain (has seen PT previously for treatment for plantar fasciitis), gait and balance dysfunction with decreased standing tolerance due to c/o back pain.  Strength testing was unable to be accurately assessed with MMT due to c/o pain; pt declined to perform TUG and gait velocity testing due to severity of  back pain during eval session.  Pt stated she had had previous appt prior to PT eval and was very tired and having severe pain.  Pt requested to hold testing until next scheduled visit.  Pt did amb. with SPC from waiting room to clinic exam room, approx. 51' with antalgic gait pattern.    Personal Factors and Comorbidities Comorbidity 2;Fitness;Behavior Pattern;Past/Current Experience;Time since onset of injury/illness/exacerbation    Comorbidities respiratory failure with hypoxia, bipolar disorder, fibromyalgia, CAD, DM type II, diabetic neuropathy, pulmonary HTN, RLS, chronic pain syndrome    Examination-Activity Limitations Locomotion Level;Transfers;Bend;Squat;Stairs;Stand;Lift;Carry    Examination-Participation Restrictions Cleaning;Driving;Laundry;Shop;Meal Prep;Yard Work    [employer] Evolving/Moderate complexity    Clinical Decision Making Moderate    Rehab Potential Fair    PT Frequency 1x / week    PT Duration 4 weeks   will plan to renew if  pt progresses   PT Treatment/Interventions ADLs/Self Care Home Management;Neuromuscular re-education;Gait training;Therapeutic activities;Stair training;Aquatic Therapy;DME Instruction;Balance training;Therapeutic exercise;Manual techniques    PT Next Visit Plan do TUG & gait velocity - begin back HEP    PT Home Exercise Plan see above    Recommended Other Services recommended aquatic PT but pt declines at this time    Consulted and Agree with Plan of Care Patient             Patient will benefit from skilled therapeutic intervention in order to improve the following deficits and impairments:  Difficulty walking, Decreased balance, Decreased activity tolerance, Decreased strength, Pain  Visit Diagnosis: Chronic bilateral low back pain without sciatica - Plan: PT plan of care cert/re-cert  Other abnormalities of gait and mobility - Plan: PT plan of care cert/re-cert  Muscle weakness (generalized) - Plan: PT plan of  care cert/re-cert  Unsteadiness on feet - Plan: PT plan of care cert/re-cert     Problem List Patient Active Problem List   Diagnosis Date Noted   Yeast infection 01/14/2021   Type 2 diabetes mellitus without complication, with long-term current use of insulin ([place]) 12/25/2020   Acute on chronic diastolic CHF (congestive heart failure)/HFpEF Exacerbation 12/16/2020   Acute respiratory failure with hypoxia ([place]) 12/16/2020   UTI (urinary tract infection) 12/14/2020   Elevated brain natriuretic peptide (BNP) level 12/14/2020   Elevated troponin 12/14/2020   Acute exacerbation of CHF (congestive heart failure) ([facility]) 12/13/2020   Chronic post-traumatic stress disorder 12/06/2020   Bipolar I disorder, most recent episode (or current) manic ([place]) 12/06/2020   Tremor 12/06/2020   Senile nuclear sclerosis 12/06/2020   Senile corneal changes 12/06/2020   Psychoses ([place]) 12/06/2020   Presbyopia 12/06/2020   Postmenopausal bleeding 12/06/2020   Postcoital bleeding 12/06/2020   Pinguecula 12/06/2020   Pain in joint, shoulder region 12/06/2020   Other psychological or physical stress 12/06/2020   Other acquired hammer toe 12/06/2020   Osteopenia 12/06/2020   Opioid dependence ([place]) 12/06/2020   Non-neoplastic nevus 12/06/2020   Essential hypertriglyceridemia 12/06/2020   Hypermetropia 12/06/2020   Debility 12/06/2020   Cystitis 12/06/2020   Depressed bipolar I disorder in full remission ([place]) 12/06/2020   Benzodiazepine dependence ([place]) 12/06/2020   Benign neoplasm of skin 12/06/2020   Anxiety 12/06/2020   Functional diarrhea 10/26/2020   Diabetes mellitus due to underlying condition with hyperglycemia, with long-term current use of insulin ([place]) 09/24/2020   Dyslipidemia 09/24/2020   Head trauma 09/17/2020   Chronic respiratory failure with hypoxia ([place]) 04/25/2020   Chronic constipation 04/25/2020   Back pain/sacroiliitis--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   Fall at home, initial encounter 04/23/2020   Ambulatory dysfunction--back pain and falls 04/23/2020   Chronic pain syndrome 08/22/2019   Nerve pain 08/22/2019   Myofascial pain dysfunction syndrome 08/22/2019   Dyspnea 08/22/2019   CAD (coronary artery disease)    Hypocalcemia    S/P shoulder replacement, 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   History of adenomatous polyp of colon 05/21/2019   Polypharmacy 05/21/2019   Benign paroxysmal positional vertigo due to bilateral vestibular disorder 05/20/2019   Hyperlipidemia associated with type 2 diabetes mellitus ([place]) 05/20/2019   Vertigo 04/25/2019   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[place]) 04/21/2019   OSA (obstructive sleep apnea) 02/24/2019   Allergic rhinitis 02/24/2019   Acute on chronic respiratory failure with hypoxia ([place]) 01/22/2019   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   Morbid obesity with alveolar hypoventilation (  [place]) 07/06/2018   Obesity (BMI 30-39.9) 07/06/2018   At risk for adverse drug event 07/06/2018   Cardiomegaly [id number]   Non-alcoholic fatty liver disease 01/12/2018   Mild intermittent asthma 01/12/2018   Senile osteoporosis 12/15/2017   Atrial fibrillation with RVR ([place]) 05/19/2017   Fibromyalgia 03/19/2017   Migraine headache with aura 02/12/2016   Diabetic neuropathy associated with type 2 diabetes mellitus ([place]) 02/06/2016   Bipolar 1 disorder, manic, moderate ([place]) 10/04/2015   Depression, recurrent ([place]) 10/03/2015   Herpes genitalis in women 07/16/2015   Long term current use of anticoagulant 05/30/2015   Family history of coronary arteriosclerosis 05/30/2015   Chronic diastolic heart failure ([place]) 05/30/2015   Dyspnea on exertion    Essential hypertension    Restless legs 04/27/2015   Lipoma 02/08/2015   Bipolar disorder ([place]) 01/23/2015   Insomnia 01/23/2015   Chronic back pain 01/04/2015   Chronic atrial fibrillation ([place]) 01/04/2015   Type 2 diabetes mellitus ([place])     Hyperlipidemia     Alda Lea, PT 02/15/2021, 3:54 PM  [facility] [address] [place], [place], [address] Phone: [phone]   Fax:  [phone]  Name: Amber Stephenson MRN: [id number] Date of Birth: 02-Jun-1946

## 2021-02-17 LAB — DRUG TOX MONITOR 1 W/CONF, ORAL FLD
Alprazolam: NEGATIVE ng/mL (ref <0.50)
Amphetamines: NEGATIVE ng/mL (ref <10)
Barbiturates: NEGATIVE ng/mL (ref <10)
Benzodiazepines: POSITIVE ng/mL — AB (ref <0.50)
Buprenorphine: NEGATIVE ng/mL (ref <0.10)
Chlordiazepoxide: NEGATIVE ng/mL (ref <0.50)
Clonazepam: NEGATIVE ng/mL (ref <0.50)
Cocaine: NEGATIVE ng/mL (ref <5.0)
Diazepam: NEGATIVE ng/mL (ref <0.50)
Fentanyl: NEGATIVE ng/mL (ref <0.10)
Flunitrazepam: NEGATIVE ng/mL (ref <0.50)
Flurazepam: NEGATIVE ng/mL (ref <0.50)
Heroin Metabolite: NEGATIVE ng/mL (ref <1.0)
Lorazepam: 0.96 ng/mL — ABNORMAL HIGH (ref <0.50)
MARIJUANA: NEGATIVE ng/mL (ref <2.5)
MDMA: NEGATIVE ng/mL (ref <10)
Meprobamate: NEGATIVE ng/mL (ref <2.5)
Methadone: NEGATIVE ng/mL (ref <5.0)
Midazolam: NEGATIVE ng/mL (ref <0.50)
Nicotine Metabolite: NEGATIVE ng/mL (ref <5.0)
Nordiazepam: NEGATIVE ng/mL (ref <0.50)
Opiates: NEGATIVE ng/mL (ref <2.5)
Oxazepam: NEGATIVE ng/mL (ref <0.50)
Phencyclidine: NEGATIVE ng/mL (ref <10)
Tapentadol: NEGATIVE ng/mL (ref <5.0)
Temazepam: NEGATIVE ng/mL (ref <0.50)
Tramadol: NEGATIVE ng/mL (ref <5.0)
Triazolam: NEGATIVE ng/mL (ref <0.50)
Zolpidem: NEGATIVE ng/mL (ref <5.0)

## 2021-02-17 LAB — DRUG TOX ALC METAB W/CON, ORAL FLD: Alcohol Metabolite: NEGATIVE ng/mL (ref <25)

## 2021-02-20 ENCOUNTER — Telehealth: Payer: Self-pay | Admitting: *Deleted

## 2021-02-20 NOTE — Telephone Encounter (Signed)
Oral drug screen is negative for her hydromorphone though she reported taking it same day as test.  She did not have pills with her at the appointment for pill count. Lorazepam is present which has been prescribed by another provider.

## 2021-02-21 NOTE — Progress Notes (Signed)
[place] Phone: [phone] Subjective:   [name], am serving as a scribe for Dr. Hulan Saas.  This visit occurred during the SARS-CoV-2 public health emergency.  Safety protocols were in place, including screening questions prior to the visit, additional usage of staff PPE, and extensive cleaning of exam room while observing appropriate contact time as indicated for disinfecting solutions.   I'm seeing this patient by the request  of:  [name], MD  CC: Low back pain  [id number]  SHARRAN CARATACHEA is a 75 y.o. female coming in with complaint of LBP. Sees pain management doc. Patient states that she is having L lower thoracic spine pain for past 5 weeks as well. Using heat for constant pain. Denies any radiating symptoms. Pain is present at rest and with movement.  Reviewing patient's chart patient has had chronic discomfort and chronic pain for quite some time as well as underlying bipolar disease.  Patient is accompanied with husband today.  Reviewed significant number of imaging and detailed below of importance.  MR Thoracic 2021 IMPRESSION: No evidence of infection.   Mild degenerative changes and chronic superior T11 endplate deformity with minimal height loss.   Shallow left T10-11 paracentral protrusion. Patent spinal canal and neural foramen.  MRI Lumbar 2022 IMPRESSION: 3 x 5 mm nodule in the dorsal spinal canal at the L2 level likely intramural. No change from prior studies. 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   Multilevel degenerative changes, stable from the prior study.   MRI cervical 2021 IMPRESSION: No evidence of cervical spine infection.   Multilevel spondylosis. Patent spinal canal. Mild left C6-7 neural foraminal narrowing.   Central/paracentral protrusions at the C4-5 and C5-6 level abutting the ventral cord. No evidence of myelomalacia. Past Medical History:   Diagnosis Date   Acute on chronic respiratory failure with hypoxia ([place]) 01/22/2019   Atrial fibrillation with RVR 05/19/2017   Bipolar I disorder 01/23/2015   CAD (coronary artery disease)    Nonobstructive; Managed by Dr. 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 Wallace Keller   RLS (restless legs syndrome) 04/27/2015   Past Surgical History:  Procedure Laterality Date   BREAST REDUCTION SURGERY     EYE SURGERY Right    cateracts   HAMMER TOE SURGERY     LEFT HEART CATH AND CORONARY ANGIOGRAPHY N/A 02/03/2018   Procedure: LEFT HEART CATH AND CORONARY ANGIOGRAPHY;  Surgeon: Martinique, Peter M, MD;  Location: [facility];  Service: Cardiovascular;  Laterality: N/A;   REVERSE SHOULDER ARTHROPLASTY Right 08/19/2019   Procedure: REVERSE SHOULDER ARTHROPLASTY;  Surgeon: Netta Cedars, MD;  Location: [facility];  Service: Orthopedics;  Laterality: Right;  interscalene block   SHOULDER SURGERY Right    "I BROKE MY SHOUDLER   THIGH SURGERY     "TO REMOVE A TUMOR "   Social History   Socioeconomic History   Marital status: Married    Spouse name: Orpah Greek   Number of children: 3   Years of education: 15   Highest education level: Bachelor's degree (e.g., BA, AB, BS)   Occupational History   Occupation: Retired    Comment: marketing  Tobacco Use   Smoking status: Never   Smokeless tobacco: Never  Vaping Use   Vaping Use: Never used  Substance and Sexual Activity   Alcohol use: Not Currently    Alcohol/week: 0.0 standard drinks   Drug use: No   Sexual activity: Yes    Partners: Male    Birth control/protection: Post-menopausal  Other Topics Concern   Not on file  Social History Narrative   Lives at home with husband.    They have 3 children who live away - [place], [place], and [place].   She is from [place] and most of her family lives there.   Her husbands's family live nearby   Right handed.   Social Determinants of Health   Financial Resource Strain: Low Risk    Difficulty of Paying Living Expenses: Not hard at all  Food Insecurity: No Food Insecurity   Worried About Charity fundraiser in the Last Year: Never true   [place] in the Last Year: Never true  Transportation Needs: No Transportation Needs   Lack of Transportation (Medical): No   Lack of Transportation (Non-Medical): No  Physical Activity: Inactive   Days of Exercise per Week: 0 days   Minutes of Exercise per Session: 0 min  Stress: Stress Concern Present   Feeling of Stress : To some extent  Social Connections: Moderately Integrated   Frequency of Communication with Friends and Family: More than three times a week   Frequency of Social Gatherings with Friends and Family: Once a week   Attends Religious Services: More than 4 times per year   Active Member of [employer] or Organizations: No   Attends Music therapist: Never   Marital Status: Married   Allergies  Allergen Reactions   Ivp Dye [Iodinated Diagnostic Agents] Anaphylaxis and Swelling    Throat closes    Codeine Other (See Comments)   Iodine Other (See Comments)   Latex     Latex tape pulls skin with it   Family History  Problem Relation Age of Onset   Diabetes Mother    Heart  disease Mother    Heart disease Brother    Heart disease Sister        CABG   Diabetes Sister    Alzheimer's disease Father    Alcohol abuse Sister    Mental illness Brother    Diabetes Brother     Current Outpatient Medications (Endocrine & Metabolic):    denosumab (PROLIA) 60 MG/ML SOSY injection, Inject 60 mg into the skin every 6 (six) months.   empagliflozin (JARDIANCE) 10 MG TABS tablet, Take 1 tablet (10 mg total) by mouth daily before breakfast.   insulin glargine (LANTUS SOLOSTAR) 100 UNIT/ML Solostar Pen, Inject 45 Units into the skin 2 (two) times daily.   insulin lispro (HUMALOG KWIKPEN) 100 UNIT/ML KwikPen, SSI changed by endocrine   metFORMIN (GLUCOPHAGE) 500 MG tablet, TAKE 1 TABLET(500 MG) BY MOUTH TWICE DAILY AFTER A MEAL  Semaglutide (RYBELSUS) 14 MG TABS, Take 14 mg by mouth daily.   Current Outpatient Medications (Cardiovascular):    atorvastatin (LIPITOR) 40 MG tablet, TAKE 1 TABLET DAILY   diltiazem (CARDIZEM CD) 120 MG 24 hr capsule, Take 1 capsule (120 mg total) by mouth at bedtime.   furosemide (LASIX) 40 MG tablet, Take 1 tablet (40 mg total) by mouth every morning. TAKE 1 TABLET EVERY MORNING   icosapent Ethyl (VASCEPA) 1 g capsule, TAKE 2 CAPSULES TWICE A DAY WITH MEALS   metoprolol succinate (TOPROL XL) 25 MG 24 hr tablet, Take 1 tablet (25 mg total) by mouth in the morning and at bedtime.   metoprolol succinate (TOPROL-XL) 100 MG 24 hr tablet, Take 1 tablet (100 mg total) by mouth in the morning and at bedtime.   Current Outpatient Medications (Respiratory):    albuterol (PROVENTIL) (2.5 MG/3ML) 0.083% nebulizer solution, Take 3 mLs (2.5 mg total) by nebulization every 6 (six) hours as needed for wheezing or shortness of breath.   albuterol (VENTOLIN HFA) 108 (90 Base) MCG/ACT inhaler, Inhale 2 puffs into the lungs every 6 (six) hours as needed for wheezing or shortness of breath.   Fluticasone-Salmeterol (ADVAIR DISKUS) 500-50 MCG/DOSE AEPB, Inhale 1  puff into the lungs in the morning and at bedtime.   Current Outpatient Medications (Analgesics):    acetaminophen (TYLENOL) 325 MG tablet, Take 2 tablets (650 mg total) by mouth every 6 (six) hours as needed for mild pain, moderate pain or fever.   Galcanezumab-gnlm (EMGALITY) 120 MG/ML SOAJ, Administer once a month starting 09/27/2020   HYDROmorphone (DILAUDID) 2 MG tablet, Take 1 tablet (2 mg total) by mouth every 6 (six) hours as needed for severe pain.   Facility-Administered Medications Ordered in Other Visits (Analgesics):    bupivacaine-epinephrine (MARCAINE W/ EPI) 0.5% -1:200000 injection  Current Outpatient Medications (Hematological):    apixaban (ELIQUIS) 5 MG TABS tablet, Take 1 tablet (5 mg total) by mouth 2 (two) times daily.   Current Outpatient Medications (Other):    ARIPiprazole (ABILIFY) 10 MG tablet, Take 3 tablets (30 mg total) by mouth daily.   carbamazepine (CARBATROL) 100 MG 12 hr capsule, Take by mouth at bedtime.   Continuous Blood Gluc Sensor (DEXCOM G6 SENSOR) MISC, 1 Device by Does not apply route as directed.   Continuous Blood Gluc Transmit (DEXCOM G6 TRANSMITTER) MISC, 1 Device by Does not apply route as directed.   escitalopram (LEXAPRO) 10 MG tablet, Take 1 tablet (10 mg total) by mouth daily.   fluconazole (DIFLUCAN) 150 MG tablet, TAKE 1 TABLET(150 MG) BY MOUTH 1 TIME FOR 1 DOSE. MAY REPEAT AFTER 3 DAYS AS NEEDED   flurazepam (DALMANE) 30 MG capsule, Take 30 mg by mouth at bedtime as needed.   LORazepam (ATIVAN) 1 MG tablet, Take 1 tablet (1 mg total) by mouth 2 (two) times daily. And give 2 tablets by mouth at bedtime   meclizine (ANTIVERT) 12.5 MG tablet, Take 1 tablet (12.5 mg total) by mouth 3 (three) times daily as needed for dizziness.   metroNIDAZOLE (METROGEL) 0.75 % gel, Apply topically.   NON FORMULARY, Diet: _____ Regular, ___  __ NAS, ___x____Consistent Carbohydrate, _______NPO _____Other   polyethylene glycol (MIRALAX / GLYCOLAX) 17 g  packet, Take 17 g by mouth daily.   potassium chloride SA (KLOR-CON) 20 MEQ tablet, Take 1 tablet (20 mEq total) by mouth daily.   pregabalin (LYRICA) 200 MG capsule, Take 1 capsule (200 mg total) by mouth 2 (two) times daily.  tiZANidine (ZANAFLEX) 4 MG capsule, TAKE 1 CAPSULE BY MOUTH THREE TIMES DAILY AS NEEDED FOR MUSCLE SPASMS   traZODone (DESYREL) 100 MG tablet, Take 400 mg by mouth at bedtime.   triamcinolone cream (KENALOG) 0.1 %, SMARTSIG:1 Application Topical 2-3 Times Daily   Vitamin D, Cholecalciferol, 25 MCG (1000 UT) TABS, Take 1 tablet by mouth daily in the afternoon.  No current facility-administered medications for this visit.   Reviewed prior external information including notes and imaging from  primary care provider As well as notes that were available from care everywhere and other healthcare systems.  Past medical history, social, surgical and family history all reviewed in electronic medical record.  No pertanent information unless stated regarding to the chief complaint.   Review of Systems:  No  visual changes, nausea, vomiting, diarrhea, constipation, dizziness, abdominal pain, skin rash, fevers, chills, night sweats, weight loss, swollen lymph nodes, , chest pain, shortness of breath,POSITIVE muscle aches, body aches, joint swelling, headaches  Objective  Pulse 64, height 5\' 2"  (1.575 m), weight 194 lb (88 kg), SpO2 95 %.   General: No apparent distress alert a seems very minorly disoriented HEENT: Pinpoint pupils noted.  Overweight Respiratory: Patient's speak in full sentences and does not appear short of breath  Cardiovascular: No lower extremity edema, non tender, no erythema  Gait \\cautiously  and takes quite some time to get out of a chair MSK: Mid back pain on the left side of the T11-T12 area in the T10 area does have some significant increase in discomfort.  Patient's pain now is even to light palpation in the area.  Seems to be somewhat out of  proportion.  Mild tenderness over the right gluteal area in the right sacroiliac joint noted.  Increasing discomfort noted with FABER test on the right.  Negative straight leg test for any radicular symptoms.  Patient does have voluntary guarding with flexion extension.    Impression and Recommendations:     The above documentation has been reviewed and is accurate and complete Lyndal Pulley, DO

## 2021-02-22 ENCOUNTER — Ambulatory Visit (INDEPENDENT_AMBULATORY_CARE_PROVIDER_SITE_OTHER): Payer: Medicare Other | Admitting: Family Medicine

## 2021-02-22 ENCOUNTER — Encounter: Payer: Self-pay | Admitting: Family Medicine

## 2021-02-22 ENCOUNTER — Other Ambulatory Visit: Payer: Self-pay

## 2021-02-22 VITALS — HR 64 | Ht 62.0 in | Wt 194.0 lb

## 2021-02-22 DIAGNOSIS — M5441 Lumbago with sciatica, right side: Secondary | ICD-10-CM | POA: Diagnosis not present

## 2021-02-22 DIAGNOSIS — G8929 Other chronic pain: Secondary | ICD-10-CM | POA: Diagnosis not present

## 2021-02-22 DIAGNOSIS — I2510 Atherosclerotic heart disease of native coronary artery without angina pectoris: Secondary | ICD-10-CM | POA: Diagnosis not present

## 2021-02-22 DIAGNOSIS — M546 Pain in thoracic spine: Secondary | ICD-10-CM

## 2021-02-22 NOTE — Patient Instructions (Signed)
Epidural T10-11- Call to schedule [phone] Do PT they will do great job See me 4 weeks after injection

## 2021-02-22 NOTE — Assessment & Plan Note (Signed)
Patient is having chronic pain for quite some time.  Multiple different medications already on board at this time.  Has failed multiple different things but is starting formal physical therapy which I hope will be beneficial.  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  Reviewing patient's previous thoracic MRIs patient did have a protruding disc at T10 and T11 causing potential nerve root impingement at that level.  Patient's pain is definitely on the left side of the midline at the T10 area.  We discussed different treatment options at this time including formal physical therapy, which patient will continue but also the possibility of an epidural.  Patient would like to consider this.  Has been on the Eliquis.  We will need to see if we need approval to have her stop it prior to this injection.  Patient understands this.  Discussed with patient about been following up with [place] 4 weeks afterwards.  Patient was accompanied with husband who also asked good questions and all questions were answered.

## 2021-02-27 ENCOUNTER — Ambulatory Visit: Payer: Medicare Other | Admitting: Student

## 2021-02-27 ENCOUNTER — Ambulatory Visit: Payer: Medicare Other | Admitting: Neurology

## 2021-02-28 ENCOUNTER — Other Ambulatory Visit: Payer: Self-pay

## 2021-02-28 ENCOUNTER — Encounter: Payer: Self-pay | Admitting: Student

## 2021-02-28 ENCOUNTER — Ambulatory Visit (INDEPENDENT_AMBULATORY_CARE_PROVIDER_SITE_OTHER): Payer: Medicare Other | Admitting: Student

## 2021-02-28 VITALS — BP 122/70 | HR 82 | Ht 62.0 in | Wt 197.0 lb

## 2021-02-28 DIAGNOSIS — I4821 Permanent atrial fibrillation: Secondary | ICD-10-CM

## 2021-02-28 DIAGNOSIS — I10 Essential (primary) hypertension: Secondary | ICD-10-CM

## 2021-02-28 DIAGNOSIS — E785 Hyperlipidemia, unspecified: Secondary | ICD-10-CM | POA: Diagnosis not present

## 2021-02-28 DIAGNOSIS — I5032 Chronic diastolic (congestive) heart failure: Secondary | ICD-10-CM | POA: Diagnosis not present

## 2021-02-28 DIAGNOSIS — I2510 Atherosclerotic heart disease of native coronary artery without angina pectoris: Secondary | ICD-10-CM | POA: Diagnosis not present

## 2021-02-28 NOTE — Patient Instructions (Addendum)
  Medication Instructions:  Your physician recommends that you continue on your current medications as directed. Please refer to the Current Medication list given to you today.  *If you need a refill on your cardiac medications before your next appointment, please call your pharmacy*   Lab Work: None If you have labs (blood work) drawn today and your tests are completely normal, you will receive your results only by: [place] (if you have MyChart) OR A paper copy in the mail If you have any lab test that is abnormal or we need to change your treatment, we will call you to review the results.   Testing/Procedures: None   Follow-Up: At [facility], you and your health needs are our priority.  As part of our continuing mission to provide you with exceptional heart care, we have created designated Provider Care Teams.  These Care Teams include your primary Cardiologist (physician) and Advanced Practice Providers (APPs -  Physician Assistants and Nurse Practitioners) who all work together to provide you with the care you need, when you need it.  We recommend signing up for the patient portal called "MyChart".  Sign up information is provided on this After Visit Summary.  MyChart is used to connect with patients for Virtual Visits (Telemedicine).  Patients are able to view lab/test results, encounter notes, upcoming appointments, etc.  Non-urgent messages can be sent to your provider as well.   To learn more about what you can do with MyChart, go to [email or web address].    Your next appointment:   6 month(s)  The format for your next appointment:   In Person  Provider:   Establish with MD   Other Instructions

## 2021-02-28 NOTE — Progress Notes (Signed)
Cardiology Office Note    Date:  02/28/2021   ID:  Amber Stephenson, DOB 11/21/1945, MRN [id number]  PCP:  [name], MD  Cardiologist: Previously Dr. Bronson Ing --> Needs to switch to new MD  Chief Complaint  Patient presents with   Follow-up    6 month visit    History of Present Illness:    Amber Stephenson is a 75 y.o. female with past medical history of permanent atrial fibrillation (on Eliquis), HFpEF, HTN, HLD, Type 2 DM, Stage 2-3 CKD, asthma, OSA (intolerant to CPAP), Bipolar Disorder and CAD (nonobstructive CAD by cath in 01/2018) who presents to the office today for 73-month follow-up.   She was last examined by myself in 08/2020 and her main complaint at that time was chronic pain which was being followed by pain management. She denied any recent chest pain or palpitations and was overall stable from a cardiac perspective. She was continued on her current cardiac medications including Cardizem CD 120 mg daily, Toprol-XL 125 mg twice daily, Eliquis 5 mg twice daily and Lasix 10 mg daily.  In the interim, she was admitted to [facility] in 11/2020 for acute hypoxic respiratory failure in the setting of a CHF exacerbation and due to possible PNA. She was treated with IV Lasix along with Azithromycin. Echocardiogram that admission showed a preserved EF of 65 to 70% with no regional wall motion abnormalities. She did have severe biatrial dilation, trivial MR and low normal RV function. She was discharged on Lasix 40 mg daily.  In talking with the patient and her husband today, she reports overall doing well from a cardiac perspective since her last visit. She denies any recent exertional chest pain or palpitations. She does have dyspnea typically in the morning hours and this improves with the use of her inhaler. Also experiences intermittent wheezing which is typically worse in the evening hours. She was unaware of her diagnosis of asthma until today and this was verified by  reviewing prior Pulmonology notes. She denies any specific orthopnea, PND or pitting edema. Does notice some abdominal distention. Recently started a probiotic less than 2 weeks ago so it is difficult to tell if this has helped with her symptoms.   Past Medical History:  Diagnosis Date   Acute on chronic respiratory failure with hypoxia ([place]) 01/22/2019   Asthma    Atrial fibrillation with RVR 05/19/2017   Bipolar I disorder 01/23/2015   CAD (coronary artery disease)    Nonobstructive; Managed by Dr. Bronson Ing   Cardiomegaly 01/12/2018   Chronic anticoagulation 05/30/2015   Chronic atrial fibrillation ([place]) 01/04/2015   Chronic back pain    Lower back   Chronic diastolic CHF (congestive heart failure) 05/30/2015   Diabetes mellitus, type 2, without complication    Diabetic neuropathy 02/06/2016   Dysrhythmia    A-Fib   Essential hypertension    Herpes genitalis in women 07/16/2015   Hyperlipidemia    Hypoxia 02/01/2019   Insomnia 01/23/2015   Lipoma 02/08/2015   Major depressive disorder    Migraine headache with aura 02/12/2016   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[place]) 04/21/2019   NAFL (nonalcoholic fatty liver) 63/78/5885   OSA (obstructive sleep apnea) 02/24/2019   10/09/2018 - HST  - AHI 40.6    Pulmonary hypertension    Renal insufficiency    Managed by Dr. Wallace Keller   RLS (restless legs syndrome) 04/27/2015    Past Surgical History:  Procedure Laterality Date   BREAST REDUCTION SURGERY  EYE SURGERY Right    cateracts   HAMMER TOE SURGERY     LEFT HEART CATH AND CORONARY ANGIOGRAPHY N/A 02/03/2018   Procedure: LEFT HEART CATH AND CORONARY ANGIOGRAPHY;  Surgeon: Martinique, Peter M, MD;  Location: [facility];  Service: Cardiovascular;  Laterality: N/A;   REVERSE SHOULDER ARTHROPLASTY Right 08/19/2019   Procedure: REVERSE SHOULDER ARTHROPLASTY;  Surgeon: Netta Cedars, MD;  Location: [facility];  Service: Orthopedics;  Laterality: Right;  interscalene block    SHOULDER SURGERY Right    "I BROKE MY SHOUDLER   THIGH SURGERY     "TO REMOVE A TUMOR "    Current Medications: Outpatient Medications Prior to Visit  Medication Sig Dispense Refill   acetaminophen (TYLENOL) 325 MG tablet Take 2 tablets (650 mg total) by mouth every 6 (six) hours as needed for mild pain, moderate pain or fever. 12 tablet 0   albuterol (PROVENTIL) (2.5 MG/3ML) 0.083% nebulizer solution Take 3 mLs (2.5 mg total) by nebulization every 6 (six) hours as needed for wheezing or shortness of breath. 150 mL 0   albuterol (VENTOLIN HFA) 108 (90 Base) MCG/ACT inhaler Inhale 2 puffs into the lungs every 6 (six) hours as needed for wheezing or shortness of breath. 1 each 0   apixaban (ELIQUIS) 5 MG TABS tablet Take 1 tablet (5 mg total) by mouth 2 (two) times daily. 60 tablet 0   ARIPiprazole (ABILIFY) 10 MG tablet Take 3 tablets (30 mg total) by mouth daily. 90 tablet 0   atorvastatin (LIPITOR) 40 MG tablet TAKE 1 TABLET DAILY 90 tablet 0   carbamazepine (CARBATROL) 100 MG 12 hr capsule Take by mouth at bedtime.     Continuous Blood Gluc Sensor (DEXCOM G6 SENSOR) MISC 1 Device by Does not apply route as directed. 9 each 3   Continuous Blood Gluc Transmit (DEXCOM G6 TRANSMITTER) MISC 1 Device by Does not apply route as directed. 1 each 3   denosumab (PROLIA) 60 MG/ML SOSY injection Inject 60 mg into the skin every 6 (six) months. 1 mL 1   diltiazem (CARDIZEM CD) 120 MG 24 hr capsule Take 1 capsule (120 mg total) by mouth at bedtime. 90 capsule 2   empagliflozin (JARDIANCE) 10 MG TABS tablet Take 1 tablet (10 mg total) by mouth daily before breakfast. 90 tablet 1   escitalopram (LEXAPRO) 10 MG tablet Take 1 tablet (10 mg total) by mouth daily. 30 tablet 0   fluconazole (DIFLUCAN) 150 MG tablet TAKE 1 TABLET(150 MG) BY MOUTH 1 TIME FOR 1 DOSE. MAY REPEAT AFTER 3 DAYS AS NEEDED 2 tablet 0   flurazepam (DALMANE) 30 MG capsule Take 30 mg by mouth at bedtime as needed.      Fluticasone-Salmeterol (ADVAIR DISKUS) 500-50 MCG/DOSE AEPB Inhale 1 puff into the lungs in the morning and at bedtime. 60 each 0   furosemide (LASIX) 40 MG tablet Take 1 tablet (40 mg total) by mouth every morning. TAKE 1 TABLET EVERY MORNING 30 tablet 3   Galcanezumab-gnlm (EMGALITY) 120 MG/ML SOAJ Administer once a month starting 09/27/2020 1.12 mL 11   HYDROmorphone (DILAUDID) 2 MG tablet Take 1 tablet (2 mg total) by mouth every 6 (six) hours as needed for severe pain. 120 tablet 0   icosapent Ethyl (VASCEPA) 1 g capsule TAKE 2 CAPSULES TWICE A DAY WITH MEALS 120 capsule 0   insulin glargine (LANTUS SOLOSTAR) 100 UNIT/ML Solostar Pen Inject 45 Units into the skin 2 (two) times daily. 30 mL 2  insulin lispro (HUMALOG KWIKPEN) 100 UNIT/ML KwikPen SSI changed by endocrine 15 mL 3   LORazepam (ATIVAN) 1 MG tablet Take 1 tablet (1 mg total) by mouth 2 (two) times daily. And give 2 tablets by mouth at bedtime 90 tablet 0   meclizine (ANTIVERT) 12.5 MG tablet Take 1 tablet (12.5 mg total) by mouth 3 (three) times daily as needed for dizziness. 30 tablet 0   metFORMIN (GLUCOPHAGE) 500 MG tablet TAKE 1 TABLET(500 MG) BY MOUTH TWICE DAILY AFTER A MEAL 180 tablet 3   metoprolol succinate (TOPROL XL) 25 MG 24 hr tablet Take 1 tablet (25 mg total) by mouth in the morning and at bedtime. 180 tablet 3   metoprolol succinate (TOPROL-XL) 100 MG 24 hr tablet Take 1 tablet (100 mg total) by mouth in the morning and at bedtime. 180 tablet 3   metroNIDAZOLE (METROGEL) 0.75 % gel Apply topically.     NON FORMULARY Diet: _____ Regular, ___  __ NAS, ___x____Consistent Carbohydrate, _______NPO _____Other     polyethylene glycol (MIRALAX / GLYCOLAX) 17 g packet Take 17 g by mouth daily. 14 each 0   potassium chloride SA (KLOR-CON) 20 MEQ tablet Take 1 tablet (20 mEq total) by mouth daily. 30 tablet 0   pregabalin (LYRICA) 200 MG capsule Take 1 capsule (200 mg total) by mouth 2 (two) times daily. 60 capsule 5    Semaglutide (RYBELSUS) 14 MG TABS Take 14 mg by mouth daily. 90 tablet 1   tiZANidine (ZANAFLEX) 4 MG capsule TAKE 1 CAPSULE BY MOUTH THREE TIMES DAILY AS NEEDED FOR MUSCLE SPASMS 270 capsule 0   traZODone (DESYREL) 100 MG tablet Take 400 mg by mouth at bedtime.     triamcinolone cream (KENALOG) 0.1 % SMARTSIG:1 Application Topical 2-3 Times Daily     Vitamin D, Cholecalciferol, 25 MCG (1000 UT) TABS Take 1 tablet by mouth daily in the afternoon.     Facility-Administered Medications Prior to Visit  Medication Dose Route Frequency Provider Last Rate Last Admin   bupivacaine-epinephrine (MARCAINE W/ EPI) 0.5% -1:200000 injection    Anesthesia Intra-op Effie Berkshire, MD   12 mL at 01/21/19 0935     Allergies:   Ivp dye [iodinated diagnostic agents], Codeine, Iodine, and Latex   Social History   Socioeconomic History   Marital status: Married    Spouse name: Orpah Greek   Number of children: 3   Years of education: 15   Highest education level: Bachelor's degree (e.g., BA, AB, BS)  Occupational History   Occupation: Retired    Comment: marketing  Tobacco Use   Smoking status: Never   Smokeless tobacco: Never  Vaping Use   Vaping Use: Never used  Substance and Sexual Activity   Alcohol use: Not Currently    Alcohol/week: 0.0 standard drinks   Drug use: No   Sexual activity: Yes    Partners: Male    Birth control/protection: Post-menopausal  Other Topics Concern   Not on file  Social History Narrative   Lives at home with husband.    They have 3 children who live away - [place], [place], and [place].   She is from [place] and most of her family lives there.   Her husbands's family live nearby   Right handed.   Social Determinants of Health   Financial Resource Strain: Low Risk    Difficulty of Paying Living Expenses: Not hard at all  Food Insecurity: No Food Insecurity   Worried About Charity fundraiser in  the Last Year: Never true   Ran Out of Food in the Last Year:  Never true  Transportation Needs: No Transportation Needs   Lack of Transportation (Medical): No   Lack of Transportation (Non-Medical): No  Physical Activity: Inactive   Days of Exercise per Week: 0 days   Minutes of Exercise per Session: 0 min  Stress: Stress Concern Present   Feeling of Stress : To some extent  Social Connections: Moderately Integrated   Frequency of Communication with Friends and Family: More than three times a week   Frequency of Social Gatherings with Friends and Family: Once a week   Attends Religious Services: More than 4 times per year   Active Member of [employer] or Organizations: No   Attends Music therapist: Never   Marital Status: Married     Family History:  The patient's family history includes Alcohol abuse in her sister; Alzheimer's disease in her father; Diabetes in her brother, mother, and sister; Heart disease in her brother, mother, and sister; Mental illness in her brother.   Review of Systems:    Please see the history of present illness.     All other systems reviewed and are otherwise negative except as noted above.   Physical Exam:    VS:  BP 122/70   Pulse 82   Ht 5\' 2"  (1.575 m)   Wt 197 lb (89.4 kg)   SpO2 96%   BMI 36.03 kg/m    General: Well developed, well nourished,female appearing in no acute distress. Head: Normocephalic, atraumatic. Neck: No carotid bruits. JVD not elevated.  Lungs: Respirations regular and unlabored, without wheezes or rales.  Heart: Irregularly irregular. No S3 or S4.  No murmur, no rubs, or gallops appreciated. Abdomen: Appears non-distended. No obvious abdominal masses. Msk:  Strength and tone appear normal for age. No obvious joint deformities or effusions. Extremities: No clubbing or cyanosis. No pitting edema.  Distal pedal pulses are 2+ bilaterally. Neuro: Alert and oriented X 3. Moves all extremities spontaneously. No focal deficits noted. Psych:  Responds to questions appropriately  with a normal affect. Skin: No rashes or lesions noted  Wt Readings from Last 3 Encounters:  02/28/21 197 lb (89.4 kg)  02/22/21 194 lb (88 kg)  02/11/21 194 lb 3.2 oz (88.1 kg)     Studies/Labs Reviewed:   EKG:  EKG is not ordered today.    Recent Labs: 12/13/2020: B Natriuretic Peptide 474.0 12/14/2020: Magnesium 1.7 12/27/2020: ALT 16; BUN 16; Creatinine, Ser 0.94; Hemoglobin 13.7; NT-Pro BNP 965; Platelets 248; Potassium 4.5; Sodium 148   Lipid Panel    Component Value Date/Time   CHOL 219 (H) 11/14/2020 1600   TRIG 207 (H) 11/14/2020 1600   HDL 47 11/14/2020 1600   CHOLHDL 4.7 (H) 11/14/2020 1600   LDLCALC 135 (H) 11/14/2020 1600   LDLDIRECT 62 03/17/2016 1614    Additional studies/ records that were reviewed today include:   Cardiac Catheterization: 01/2018 Prox LAD lesion is 40% stenosed. Mid LAD lesion is 30% stenosed. Ost 1st Mrg to 1st Mrg lesion is 30% stenosed. LV end diastolic pressure is moderately elevated.   1. Nonobstructive CAD with left dominant circulation 2. Moderately elevated LVEDP (post hydration)   Plan: medical therapy. Will resume Eliquis this pm. Continue diuretic therapy.   No indication for antiplatelet therapy at this time.  Echocardiogram: 11/2020 IMPRESSIONS     1. Left ventricular ejection fraction, by estimation, is 65 to 70%. The  left ventricle has normal  function. The left ventricle has no regional  wall motion abnormalities. There is moderate left ventricular hypertrophy.  Left ventricular diastolic  parameters are indeterminate.   2. Right ventricular systolic function is low normal. The right  ventricular size is mildly enlarged. There is normal pulmonary artery  systolic pressure.   3. Left atrial size was severely dilated.   4. Right atrial size was severely dilated.   5. The mitral valve is abnormal. Trivial mitral valve regurgitation. No  evidence of mitral stenosis. Moderate mitral annular calcification.   6. The  aortic valve was not well visualized. Aortic valve regurgitation  is not visualized. No aortic stenosis is present.   7. The inferior vena cava is dilated in size with >50% respiratory  variability, suggesting right atrial pressure of 8 mmHg.   Assessment:    1. Permanent atrial fibrillation ([place])   2. Chronic heart failure with preserved ejection fraction ([place])   3. Coronary artery disease involving native coronary artery of native heart without angina pectoris   4. Essential hypertension   5. Hyperlipidemia LDL goal <70      Plan:   In order of problems listed above:  1. Permanent Atrial Fibrillation - She denies any recent palpitations and her HR is well-controlled in the 80's today and is typically well-controlled when checked at home as well. Continue Cardizem CD 120mg  daily and Toprol-XL 125mg  BID for rate-control. - She denies any evidence of active bleeding. Remains on Eliquis 5mg  BID for anticoagulation and labs in 11/2020 showed her Hgb was stable at 13.7 with platelets at 248 K.   2. HFpEF - Recent echocardiogram showed a preserved EF of 65-70% but she did have slightly low RV function which is likely due to known OSA and asthma. She has been intolerant to CPAP due to claustrophobia.  - Her weight has been stable and she does not appear volume overloaded on examination today. Continue Lasix 40mg  daily. Her creatinine was stable at 0.83 in 12/2020.  3. CAD - Prior cath in 01/2018 showed nonobstructive CAD as outlined above. Continue Toprol-XL and Atorvastatin. She is not on ASA given the need for anticoagulation.   4. HTN - Her BP is well-controlled at 122/70 during today's visit and has been well-controlled when checked at home. Continue current medication regimen.   5. HLD - Followed by her PCP. She remains on Atorvastatin 40mg  daily with goal LDL less than 70 given previously documented CAD.     Medication Adjustments/Labs and Tests Ordered: Current medicines are  reviewed at length with the patient today.  Concerns regarding medicines are outlined above.  Medication changes, Labs and Tests ordered today are listed in the Patient Instructions below. Patient Instructions   Medication Instructions:  Your physician recommends that you continue on your current medications as directed. Please refer to the Current Medication list given to you today.  *If you need a refill on your cardiac medications before your next appointment, please call your pharmacy*   Lab Work: None If you have labs (blood work) drawn today and your tests are completely normal, you will receive your results only by: [place] (if you have MyChart) OR A paper copy in the mail If you have any lab test that is abnormal or we need to change your treatment, we will call you to review the results.   Testing/Procedures: None   Follow-Up: At [facility], you and your health needs are our priority.  As part of our continuing mission to provide  you with exceptional heart care, we have created designated Provider Care Teams.  These Care Teams include your primary Cardiologist (physician) and Advanced Practice Providers (APPs -  Physician Assistants and Nurse Practitioners) who all work together to provide you with the care you need, when you need it.  We recommend signing up for the patient portal called "MyChart".  Sign up information is provided on this After Visit Summary.  MyChart is used to connect with patients for Virtual Visits (Telemedicine).  Patients are able to view lab/test results, encounter notes, upcoming appointments, etc.  Non-urgent messages can be sent to your provider as well.   To learn more about what you can do with MyChart, go to [email or web address].    Your next appointment:   6 month(s)  The format for your next appointment:   In Person  Provider:   Establish with MD   Other Instructions     Signed, Erma Heritage, PA-C  02/28/2021  7:26 PM    [place] [address] [place], [place] [address] Phone: [phone] Fax: [phone]

## 2021-03-01 ENCOUNTER — Other Ambulatory Visit: Payer: Self-pay | Admitting: Family Medicine

## 2021-03-04 ENCOUNTER — Telehealth: Payer: Self-pay | Admitting: Student
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
## 2021-03-05 ENCOUNTER — Other Ambulatory Visit: Payer: Self-pay

## 2021-03-05 ENCOUNTER — Encounter: Payer: Self-pay | Admitting: Physical Therapy

## 2021-03-05 ENCOUNTER — Ambulatory Visit: Payer: Medicare Other | Attending: Orthopedic Surgery | Admitting: Physical Therapy

## 2021-03-05 DIAGNOSIS — G8929 Other chronic pain: Secondary | ICD-10-CM | POA: Insufficient documentation

## 2021-03-05 DIAGNOSIS — M6281 Muscle weakness (generalized): Secondary | ICD-10-CM | POA: Insufficient documentation

## 2021-03-05 DIAGNOSIS — M545 Low back pain, unspecified: Secondary | ICD-10-CM | POA: Insufficient documentation

## 2021-03-05 NOTE — Patient Instructions (Signed)
Knee to Chest: Advanced    Lie with one leg straight, one bent. Bring bent knee up. Be sure pelvis does not tilt or rotate. Lift knee __5_ times. Restabilize pelvis. Repeat with other leg. Do _1__ sets, __1-2      Lower Trunk Rotation Stretch    Keeping back flat and feet together, rotate knees to left side. Hold __10-15__ seconds. Repeat __3__ times per set. Do __1-2__ sets per session. Do _1___ sessions per day.  MOVE LEGS TO LEFT SIDE - ARMS OUT LIKE "T" POSITION - TURN HEAD TO RIGHT SIDE

## 2021-03-06 ENCOUNTER — Encounter: Payer: Self-pay | Admitting: Physical Therapy

## 2021-03-06 NOTE — Therapy (Signed)
[facility] [address] [place], [place], [address] Phone: [phone]   Fax:  [phone]  Physical Therapy Treatment  Patient Details  Name: Amber Stephenson MRN: [id number] Date of Birth: 08/07/1945 Referring Provider (PT): Netta Cedars, MD   Encounter Date: 03/05/2021   PT End of Session - 03/06/21 1304     Visit Number 3    Number of Visits 5    Date for PT Re-Evaluation 03/15/21    Authorization Type Medicare    Authorization Time Period 02-14-21 - 03-16-21    PT Start Time 1535    PT Stop Time 1620    PT Time Calculation (min) 45 min    Activity Tolerance Patient limited by pain    Behavior During Therapy [facility] for tasks assessed/performed             Past Medical History:  Diagnosis Date   Acute on chronic respiratory failure with hypoxia ([place]) 01/22/2019   Asthma    Atrial fibrillation with RVR 05/19/2017   Bipolar I disorder 01/23/2015   CAD (coronary artery disease)    Nonobstructive; Managed by Dr. Bronson Ing   Cardiomegaly 01/12/2018   Chronic anticoagulation 05/30/2015   Chronic atrial fibrillation ([place]) 01/04/2015   Chronic back pain    Lower back   Chronic diastolic CHF (congestive heart failure) 05/30/2015   Diabetes mellitus, type 2, without complication    Diabetic neuropathy 02/06/2016   Dysrhythmia    A-Fib   Essential hypertension    Herpes genitalis in women 07/16/2015   Hyperlipidemia    Hypoxia 02/01/2019   Insomnia 01/23/2015   Lipoma 02/08/2015   Major depressive disorder    Migraine headache with aura 02/12/2016   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   NAFL (nonalcoholic fatty liver) 25/42/7062   OSA (obstructive sleep apnea) 02/24/2019   10/09/2018 - HST  - AHI 40.6    Pulmonary hypertension    Renal insufficiency    Managed by Dr. Wallace Keller   RLS (restless legs syndrome) 04/27/2015    Past Surgical History:  Procedure Laterality Date   BREAST REDUCTION  SURGERY     EYE SURGERY Right    cateracts   HAMMER TOE SURGERY     LEFT HEART CATH AND CORONARY ANGIOGRAPHY N/A 02/03/2018   Procedure: LEFT HEART CATH AND CORONARY ANGIOGRAPHY;  Surgeon: Martinique, Peter M, MD;  Location: [facility];  Service: Cardiovascular;  Laterality: N/A;   REVERSE SHOULDER ARTHROPLASTY Right 08/19/2019   Procedure: REVERSE SHOULDER ARTHROPLASTY;  Surgeon: Netta Cedars, MD;  Location: [facility];  Service: Orthopedics;  Laterality: Right;  interscalene block   SHOULDER SURGERY Right    "I BROKE MY SHOUDLER   THIGH SURGERY     "TO REMOVE A TUMOR "    There were no vitals filed for this visit.   Subjective Assessment - 03/05/21 1538     Subjective Pt states her back pain flared up last night to a 10+ - used heat and stretching and that helped    Pertinent History CAD, Chronic pain syndrome, bipolar disorder, fibromyalgia, chronic respiratory failure with hypoxia, DM type II, diabetic neuropathy, pulmonary HTN, RLS, HTN    How long can you sit comfortably? no problems with sitting    How long can you stand comfortably? 3"-4"    How long can you walk comfortably? unable    Patient Stated Goals pt able to stand longer, exercise, and decrease pain    Currently in Pain? Yes  Pain Score 8     Pain Location Back    Pain Orientation Right;Lower    Pain Descriptors / Indicators Aching;Sharp    Pain Type Chronic pain    Pain Onset More than a month ago    Pain Frequency Constant    Aggravating Factors  standing & walking    Pain Relieving Factors heat &  stretching                               [facility] Adult PT Treatment/Exercise - 03/06/21 0001       Exercises   Exercises Lumbar      Lumbar Exercises: Stretches   Single Knee to Chest Stretch Right;Left;2 reps;20 seconds    Double Knee to Chest Stretch 1 rep;10 seconds    Lower Trunk Rotation 2 reps;20 seconds   to Lt side for Rt side stretch   Pelvic Tilt 10 reps   3 sec hold   Other  Lumbar Stretch Exercise Pt rolled blue physioball straight out in front, for trunk flexion, to achieve low back stretch - held for 15 secs x 3 reps; then rolled ball to Lt side for Rt side stretch - 15 sec hold x 2 reps      Lumbar Exercises: Supine   Clam 10 reps   RLE   Bridge 10 reps    Other Supine Lumbar Exercises hip flexion with knee flexed in hooklying position 10 reps each leg                     PT Education - 03/06/21 1303     Education Details pt issued HEP - hip flexion and trunk rotation - pt stated she is already doing pelvic tilts, bridging and knee to chest stretch at home on regular basis    Person(s) Educated Patient    Methods Explanation;Demonstration;Handout    Comprehension Verbalized understanding;Returned demonstration              PT Short Term Goals - 02/15/21 1544       PT SHORT TERM GOAL #1   Title same as LTG's               PT Long Term Goals - 03/06/21 1309       PT LONG TERM GOAL #1   Title Patient will be independent with HEP.    Time 4    Period Weeks    Status New      PT LONG TERM GOAL #2   Title Pt will improve TUG score by at least 4 secs to demo improved functional mobility.    Time 4    Period Weeks    Status New      PT LONG TERM GOAL #3   Title Pt will report at least 25% improvement in back pain to increase ease with ADL's and mobility.    Time 4    Period Weeks    Status New      PT LONG TERM GOAL #4   Title Pt will amb. 39' with SPC with c/o back pain </= 5/10 to increase independence & safety with community mobility.    Baseline 10' with SPC on 02-14-21 with pain 8/10 intensity    Time 4    Period Weeks    Status New      PT LONG TERM GOAL #5   Title Increase gait velocity by at least .4  ft/sec with use of SPC for incr. gait efficiency.    Time 4    Period Weeks    Status New                   Plan - 03/06/21 1305     Clinical Impression Statement Pt tolerated back exercises  well but declined Sci[employer] exercise at end of session, stating her back felt rather good and she wanted to stop at that point, rather than risk doing an exercise that may increase her back pain.  Pt demonstrated understanding of exercises issued for HEP.  Pt continues to decline aquatic therapy at this time.  Cont with POC.    Personal Factors and Comorbidities Comorbidity 2;Fitness;Behavior Pattern;Past/Current Experience;Time since onset of injury/illness/exacerbation    Comorbidities respiratory failure with hypoxia, bipolar disorder, fibromyalgia, CAD, DM type II, diabetic neuropathy, pulmonary HTN, RLS, chronic pain syndrome    Examination-Activity Limitations Locomotion Level;Transfers;Bend;Squat;Stairs;Stand;Lift;Carry    Examination-Participation Restrictions Cleaning;Driving;Laundry;Shop;Meal Prep;Yard Work    [employer] Evolving/Moderate complexity    Rehab Potential Fair    PT Frequency 1x / week    PT Duration 4 weeks   will plan to renew if pt progresses   PT Treatment/Interventions ADLs/Self Care Home Management;Neuromuscular re-education;Gait training;Therapeutic activities;Stair training;Aquatic Therapy;DME Instruction;Balance training;Therapeutic exercise;Manual techniques    PT Next Visit Plan do TUG & gait velocity - begin back HEP    PT Home Exercise Plan see above    Consulted and Agree with Plan of Care Patient             Patient will benefit from skilled therapeutic intervention in order to improve the following deficits and impairments:  Difficulty walking, Decreased balance, Decreased activity tolerance, Decreased strength, Pain  Visit Diagnosis: Chronic bilateral low back pain without sciatica  Muscle weakness (generalized)     Problem List Patient Active Problem List   Diagnosis Date Noted   Yeast infection 01/14/2021   Type 2 diabetes mellitus without complication, with long-term current use of insulin ([place]) 12/25/2020   Acute on  chronic diastolic CHF (congestive heart failure)/HFpEF Exacerbation 12/16/2020   Acute respiratory failure with hypoxia ([place]) 12/16/2020   UTI (urinary tract infection) 12/14/2020   Elevated brain natriuretic peptide (BNP) level 12/14/2020   Elevated troponin 12/14/2020   Acute exacerbation of CHF (congestive heart failure) ([place]) 12/13/2020   Chronic post-traumatic stress disorder 12/06/2020   Bipolar I disorder, most recent episode (or current) manic ([place]) 12/06/2020   Tremor 12/06/2020   Senile nuclear sclerosis 12/06/2020   Senile corneal changes 12/06/2020   Psychoses ([place]) 12/06/2020   Presbyopia 12/06/2020   Postmenopausal bleeding 12/06/2020   Postcoital bleeding 12/06/2020   Pinguecula 12/06/2020   Pain in joint, shoulder region 12/06/2020   Other psychological or physical stress 12/06/2020   Other acquired hammer toe 12/06/2020   Osteopenia 12/06/2020   Opioid dependence ([place]) 12/06/2020   Non-neoplastic nevus 12/06/2020   Essential hypertriglyceridemia 12/06/2020   Hypermetropia 12/06/2020   Debility 12/06/2020   Cystitis 12/06/2020   Depressed bipolar I disorder in full remission ([place]) 12/06/2020   Benzodiazepine dependence ([place]) 12/06/2020   Benign neoplasm of skin 12/06/2020   Anxiety 12/06/2020   Functional diarrhea 10/26/2020   Diabetes mellitus due to underlying condition with hyperglycemia, with long-term current use of insulin ([place]) 09/24/2020   Dyslipidemia 09/24/2020   Head trauma 09/17/2020   Chronic respiratory failure with hypoxia ([place]) 04/25/2020   Chronic constipation 04/25/2020   Back pain/sacroiliitis--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   Fall at home, initial encounter 04/23/2020   Ambulatory dysfunction--back pain and falls 04/23/2020   Chronic pain syndrome 08/22/2019   Nerve pain 08/22/2019   Myofascial pain dysfunction syndrome 08/22/2019   Dyspnea 08/22/2019   CAD (coronary artery  disease)    Hypocalcemia    S/P shoulder replacement, 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   History of adenomatous polyp of colon 05/21/2019   Polypharmacy 05/21/2019   Benign paroxysmal positional vertigo due to bilateral vestibular disorder 05/20/2019   Hyperlipidemia associated with type 2 diabetes mellitus ([place]) 05/20/2019   Vertigo 04/25/2019   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   OSA (obstructive sleep apnea) 02/24/2019   Allergic rhinitis 02/24/2019   Acute on chronic respiratory failure with hypoxia ([place]) 01/22/2019   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   Morbid obesity with alveolar hypoventilation ([place]) 07/06/2018   Obesity (BMI 30-39.9) 07/06/2018   At risk for adverse drug event 07/06/2018   Cardiomegaly 41/93/7902   Non-alcoholic fatty liver disease 01/12/2018   Mild intermittent asthma 01/12/2018   Senile osteoporosis 12/15/2017   Atrial fibrillation with RVR ([place]) 05/19/2017   Fibromyalgia 03/19/2017   Migraine headache with aura 02/12/2016   Diabetic neuropathy associated with type 2 diabetes mellitus ([place]) 02/06/2016   Bipolar 1 disorder, manic, moderate ([place]) 10/04/2015   Depression, recurrent ([place]) 10/03/2015   Herpes genitalis in women 07/16/2015   Long term current use of anticoagulant 05/30/2015   Family history of coronary arteriosclerosis 05/30/2015   Chronic diastolic heart failure ([place]) 05/30/2015   Dyspnea on exertion    Essential hypertension    Restless legs 04/27/2015   Lipoma 02/08/2015   Bipolar disorder ([place]) 01/23/2015   Insomnia 01/23/2015   Chronic back pain 01/04/2015   Chronic atrial fibrillation ([place]) 01/04/2015   Type 2 diabetes mellitus ([place])    Hyperlipidemia     Pistol Kessenich, [place], PT 03/06/2021, 1:10 PM  [facility] [address] [place], [place], [address] Phone: [phone]   Fax:  [phone]  Name: TARITA DESHMUKH MRN: [id number] Date of Birth:  11/12/1945

## 2021-03-14 ENCOUNTER — Ambulatory Visit: Payer: Medicare Other | Admitting: Physical Therapy

## 2021-03-14 DIAGNOSIS — Z961 Presence of intraocular lens: Secondary | ICD-10-CM | POA: Diagnosis not present

## 2021-03-14 DIAGNOSIS — H40013 Open angle with borderline findings, low risk, bilateral: Secondary | ICD-10-CM | POA: Diagnosis not present

## 2021-03-18 IMAGING — DX PORTABLE CHEST - 1 VIEW
1 series · 1 of 1 positions shown · non-contrast
Comparison: Chest radiograph 01/21/2019

CLINICAL DATA: Patient with shortness of breath.

EXAM:
PORTABLE CHEST 1 VIEW

[chest ap]
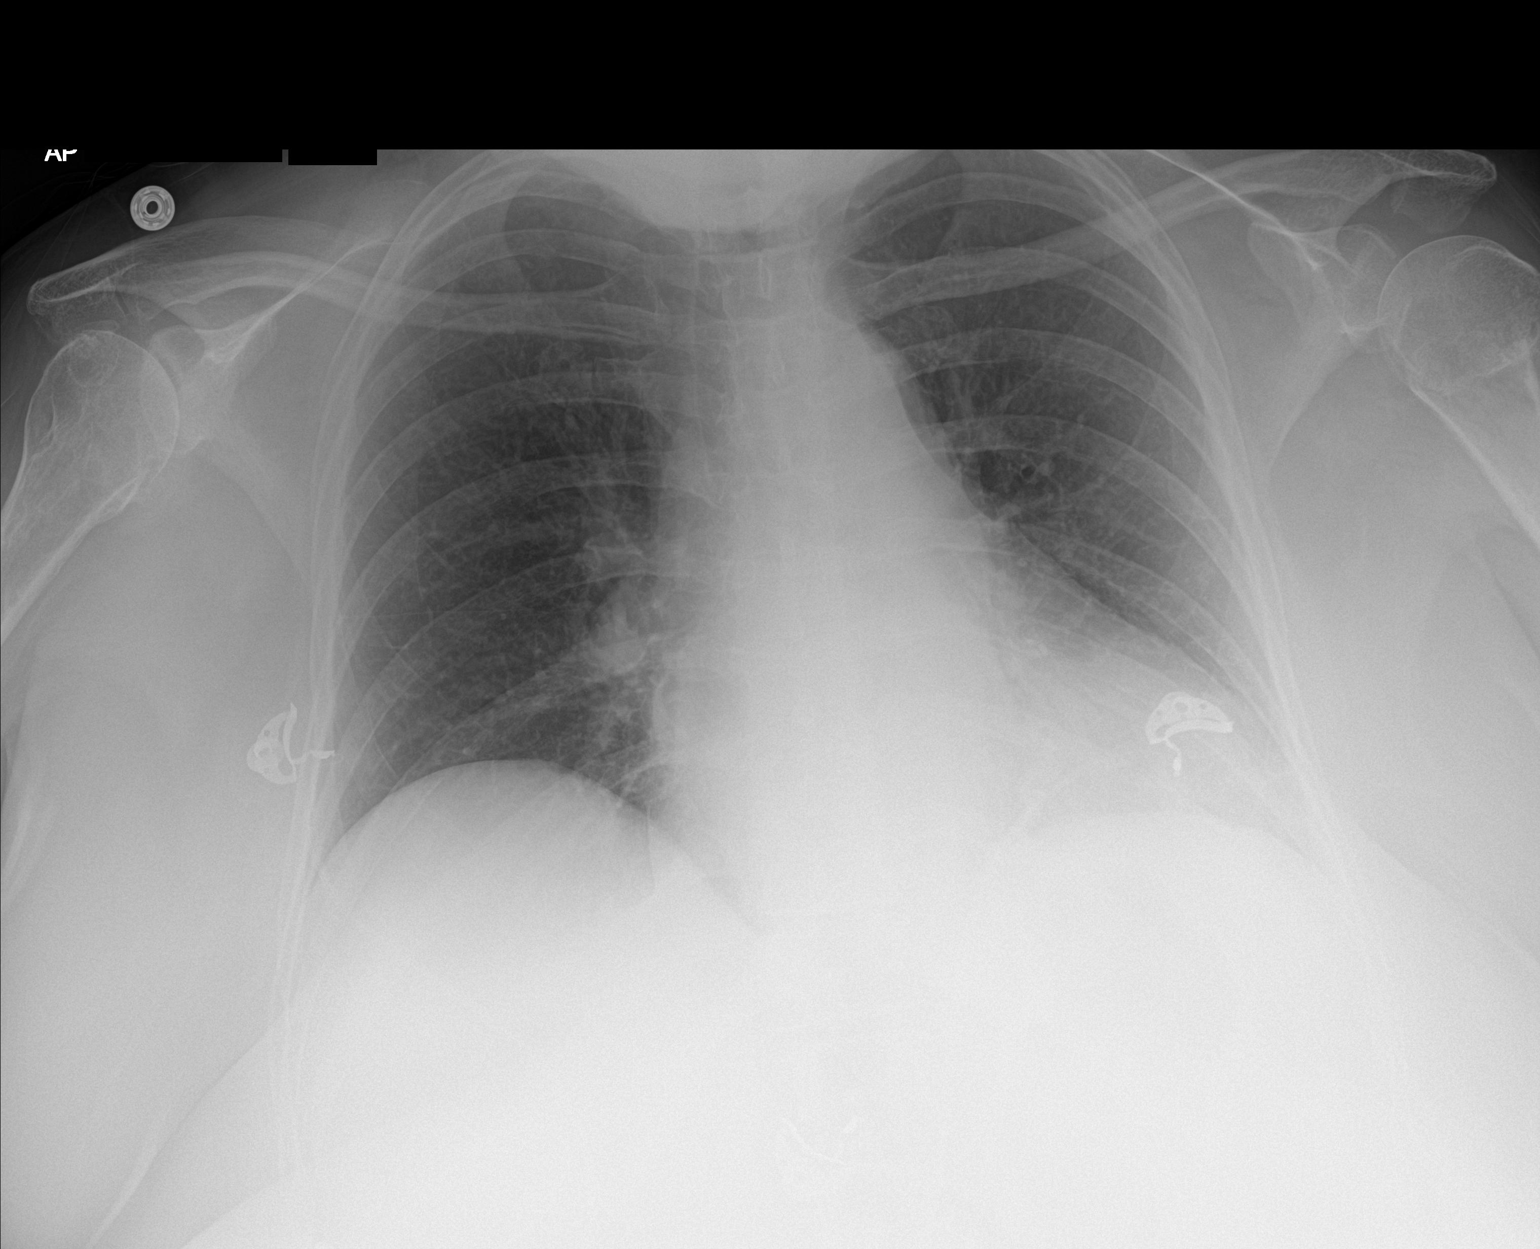

[1 of 1 positions shown; findings below may reference images not displayed]

FINDINGS: Monitoring leads overlie the patient. Stable cardiomegaly. Low lung
volumes. Bibasilar atelectasis. Improved aeration of the left lower
lung. No pleural effusion or pneumothorax.
IMPRESSION: Improved aeration of the left lower lung.

Minimal bibasilar atelectasis.

Cardiomegaly.

## 2021-03-20 ENCOUNTER — Other Ambulatory Visit: Payer: Self-pay

## 2021-03-20 ENCOUNTER — Telehealth: Payer: Self-pay | Admitting: Family Medicine

## 2021-03-20 DIAGNOSIS — M546 Pain in thoracic spine: Secondary | ICD-10-CM

## 2021-03-20 NOTE — Telephone Encounter (Signed)
I guess need to check with patient and if true allergy then we would need to figure out how to order it in the hospital and consider pre-treatment with benedryl but I think that would be ordered by radiology

## 2021-03-20 NOTE — Telephone Encounter (Signed)
Amber Stephenson from [facility] called stating that the patient was scheduled for an Epidural tomorrow but she is allergic to dye. The Radiologist did not feel comfortable doing this procedure and reccommended that this could be done at the hospital.  Please advise on how to proceed.

## 2021-03-20 NOTE — Telephone Encounter (Signed)
Per a verbal from Dr. Tamala Julian he would like for patient to consult with neurosurgery before getting any injections. Patient states that her throat closes with contrast dye. Patient agrees with plan. Referral placed.

## 2021-03-20 NOTE — Telephone Encounter (Signed)
Left message for patient to call back  

## 2021-03-21 ENCOUNTER — Inpatient Hospital Stay: Admission: RE | Admit: 2021-03-21 | Payer: TRICARE For Life (TFL) | Source: Ambulatory Visit

## 2021-03-25 ENCOUNTER — Telehealth: Payer: Self-pay

## 2021-03-25 MED ORDER — HYDROMORPHONE HCL 2 MG PO TABS: 2.0000 mg | ORAL_TABLET | Freq: Four times a day (QID) | ORAL | 0 refills | Status: DC | PRN

## 2021-03-25 NOTE — Telephone Encounter (Signed)
Patient is calling requesting a refill on Dilaudid. Last fill 02/12/21

## 2021-03-26 ENCOUNTER — Ambulatory Visit: Payer: Medicare Other | Admitting: Physical Therapy

## 2021-03-26 ENCOUNTER — Other Ambulatory Visit: Payer: Self-pay

## 2021-03-26 ENCOUNTER — Telehealth: Payer: Self-pay | Admitting: Internal Medicine

## 2021-03-26 DIAGNOSIS — M545 Low back pain, unspecified: Secondary | ICD-10-CM

## 2021-03-26 DIAGNOSIS — G8929 Other chronic pain: Secondary | ICD-10-CM | POA: Diagnosis not present

## 2021-03-26 DIAGNOSIS — M6281 Muscle weakness (generalized): Secondary | ICD-10-CM | POA: Diagnosis not present

## 2021-03-26 MED ORDER — DEXCOM G6 TRANSMITTER MISC: 1.0000 | 3 refills | Status: DC

## 2021-03-26 NOTE — Telephone Encounter (Signed)
Patient notified

## 2021-03-26 NOTE — Telephone Encounter (Signed)
Pt calling in to request refill of Dexcom G6 trasmitters  [id number] - [place], [place]  Pt contact [phone]

## 2021-03-26 NOTE — Telephone Encounter (Signed)
Script sent  

## 2021-03-27 ENCOUNTER — Ambulatory Visit (INDEPENDENT_AMBULATORY_CARE_PROVIDER_SITE_OTHER): Payer: Medicare Other | Admitting: Family Medicine

## 2021-03-27 ENCOUNTER — Encounter: Payer: Self-pay | Admitting: Family Medicine

## 2021-03-27 VITALS — BP 134/90 | HR 65 | Temp 97.3°F | Ht 62.0 in | Wt 194.8 lb

## 2021-03-27 DIAGNOSIS — I2510 Atherosclerotic heart disease of native coronary artery without angina pectoris: Secondary | ICD-10-CM | POA: Diagnosis not present

## 2021-03-27 DIAGNOSIS — S70912A Unspecified superficial injury of left hip, initial encounter: Secondary | ICD-10-CM

## 2021-03-27 DIAGNOSIS — T148XXA Other injury of unspecified body region, initial encounter: Secondary | ICD-10-CM

## 2021-03-27 NOTE — Progress Notes (Signed)
Chief Complaint  Patient presents with   Fall    HPI  Patient presents today for sleeping on a wet surface in her bathroom.  She fell and hit her left hip on the toilet and bounced off it and landed on the floor.  Now she has a large bruise.  She is concerned about blood clots and complications of a blood clot.  It is also rather tender at the lateral posterior left thigh area as well.  PMH: Smoking status noted ROS: Per HPI  Objective: BP 134/90   Pulse 65   Temp (!) 97.3 F (36.3 C)   Ht 5\' 2"  (1.575 m)   Wt 194 lb 12.8 oz (88.4 kg)   SpO2 93%   BMI 35.63 kg/m  Gen: NAD, alert, cooperative with exam HEENT: NCAT, EOMI, PERRL Ext: Left lower extremity has full range of motion.  There is a large hematoma covering most of the lateral and anterolateral aspect of the thigh from the hip to the knee.  There is a plaque formation from the bruising at the posterolateral aspect.  This is moderately tender.  There is some Biliverdin formation noted within the bruised region Neuro: Alert and oriented, No gross deficits  Assessment and plan:  1. Hematoma and contusion     Patient reassured that this is beginning to resolve on its own.  It is going to go slightly because of the use of Eliquis.  I encouraged her to stay away from NSAIDs.  She should apply heat on a regular basis through the day.  Avoid keeping it on long enough to burn the skin of course.  If the bruise starts to get a deeper bluish-red color or spread she should follow-up here.   No orders of the defined types were placed in this encounter.   Follow up as needed.  [name], MD

## 2021-03-27 NOTE — Therapy (Signed)
[facility] [address] [place], [place], [address] Phone: [phone]   Fax:  [phone]  Physical Therapy Treatment  Patient Details  Name: Amber Stephenson MRN: [id number] Date of Birth: 01-07-1946 Referring Provider (PT): Netta Cedars, MD   Encounter Date: 03/26/2021   PT End of Session - 03/27/21 1154     Visit Number 3    Number of Visits 5    Date for PT Re-Evaluation 04/16/21    Authorization Type Medicare    Authorization Time Period 02-14-21 - 04-16-21    PT Start Time 1537    PT Stop Time 1615    PT Time Calculation (min) 38 min    Activity Tolerance Patient limited by pain    Behavior During Therapy [facility] for tasks assessed/performed             Past Medical History:  Diagnosis Date   Acute on chronic respiratory failure with hypoxia ([place]) 01/22/2019   Asthma    Atrial fibrillation with RVR 05/19/2017   Bipolar I disorder 01/23/2015   CAD (coronary artery disease)    Nonobstructive; Managed by Dr. Bronson Ing   Cardiomegaly 01/12/2018   Chronic anticoagulation 05/30/2015   Chronic atrial fibrillation ([place]) 01/04/2015   Chronic back pain    Lower back   Chronic diastolic CHF (congestive heart failure) 05/30/2015   Diabetes mellitus, type 2, without complication    Diabetic neuropathy 02/06/2016   Dysrhythmia    A-Fib   Essential hypertension    Herpes genitalis in women 07/16/2015   Hyperlipidemia    Hypoxia 02/01/2019   Insomnia 01/23/2015   Lipoma 02/08/2015   Major depressive disorder    Migraine headache with aura 02/12/2016   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   NAFL (nonalcoholic fatty liver) 70/06/7492   OSA (obstructive sleep apnea) 02/24/2019   10/09/2018 - HST  - AHI 40.6    Pulmonary hypertension    Renal insufficiency    Managed by Dr. Wallace Keller   RLS (restless legs syndrome) 04/27/2015    Past Surgical History:  Procedure Laterality Date   BREAST REDUCTION  SURGERY     EYE SURGERY Right    cateracts   HAMMER TOE SURGERY     LEFT HEART CATH AND CORONARY ANGIOGRAPHY N/A 02/03/2018   Procedure: LEFT HEART CATH AND CORONARY ANGIOGRAPHY;  Surgeon: Martinique, Peter M, MD;  Location: [facility];  Service: Cardiovascular;  Laterality: N/A;   REVERSE SHOULDER ARTHROPLASTY Right 08/19/2019   Procedure: REVERSE SHOULDER ARTHROPLASTY;  Surgeon: Netta Cedars, MD;  Location: [facility];  Service: Orthopedics;  Laterality: Right;  interscalene block   SHOULDER SURGERY Right    "I BROKE MY SHOUDLER   THIGH SURGERY     "TO REMOVE A TUMOR "    There were no vitals filed for this visit.   Subjective Assessment - 03/26/21 1544     Subjective Pt reports she is not doing too well - states she fell in her bathroom a couple of days ago and has a large bruised area on her Lt lateral thigh - reports it has knots in the area which she does not know what that is.  Pt states the pain is sharp but reports she is able to do PT today.  Pt was instructed to contact MD for evaluation of hematoma.    Pertinent History CAD, Chronic pain syndrome, bipolar disorder, fibromyalgia, chronic respiratory failure with hypoxia, DM type II, diabetic neuropathy, pulmonary HTN, RLS, HTN  How long can you sit comfortably? no problems with sitting    How long can you stand comfortably? 3"-4"    How long can you walk comfortably? unable    Patient Stated Goals pt able to stand longer, exercise, and decrease pain    Currently in Pain? Yes    Pain Score 6     Pain Location Back    Pain Orientation Right;Lower    Pain Descriptors / Indicators Sharp;Aching    Pain Type Chronic pain    Pain Onset More than a month ago    Pain Frequency Constant    Multiple Pain Sites Yes    Pain Score 8    Pain Location Leg    Pain Orientation Left    Pain Descriptors / Indicators Sharp    Pain Type Acute pain    Pain Onset In the past 7 days    Pain Frequency Constant    Aggravating Factors  no  specific    Pain Relieving Factors ice pack                               [facility] Adult PT Treatment/Exercise - 03/27/21 0001       Exercises   Exercises Lumbar      Lumbar Exercises: Stretches   Single Knee to Chest Stretch Right;Left;2 reps;20 seconds   passive stretch by PT   Double Knee to Chest Stretch 1 rep;20 seconds    Lower Trunk Rotation 20 seconds;1 rep   to Lt side for Rt side stretch   Pelvic Tilt 10 reps   3 sec hold   Other Lumbar Stretch Exercise Pt rolled green physioball straight out in front, for trunk flexion, to achieve low back stretch - held for 15 secs x 3 reps; then rolled ball to Lt side for Rt side stretch - 15 sec hold x 2 reps            TherEx:  Pelvic tilt with hip flexion in hooklying position 10 reps each leg Hip abdct/adduction 10 reps I hooklying position Attempted SLR in small range with mod assist but pt reported increased back pain with this exercise so it was discontinued            PT Short Term Goals - 02/15/21 1544       PT SHORT TERM GOAL #1   Title same as LTG's               PT Long Term Goals - 03/27/21 1202       PT LONG TERM GOAL #1   Title Patient will be independent with HEP.    Time 4    Period Weeks    Status New    Target Date 03/15/21      PT LONG TERM GOAL #2   Title Pt will improve TUG score by at least 4 secs to demo improved functional mobility.    Time 4    Period Weeks    Status New      PT LONG TERM GOAL #3   Title Pt will report at least 25% improvement in back pain to increase ease with ADL's and mobility.    Time 4    Period Weeks    Status New      PT LONG TERM GOAL #4   Title Pt will amb. 230' with SPC with c/o back pain </= 5/10 to increase independence &  safety with community mobility.    Baseline 10' with SPC on 02-14-21 with pain 8/10 intensity    Time 4    Period Weeks    Status New      PT LONG TERM GOAL #5   Title Increase gait velocity by at least  .4 ft/sec with use of SPC for incr. gait efficiency.    Time 4    Period Weeks    Status New                   Plan - 03/27/21 1158     Clinical Impression Statement Pt limited by pain in low back and also in Lt lateral thigh due to recent fall sustained in her bathroom with large hematoma present. Pt was instructed to contact MD for appt for evaluation of hematoma.  Pt was able to tolerate passive stretches for low back, including single & double knee to chest; pt was able to actively perform these stretches in previous session.  Pt reports she is scheduled for consult for possible epidural for low back pain.  Cont with POC.    Personal Factors and Comorbidities Comorbidity 2;Fitness;Behavior Pattern;Past/Current Experience;Time since onset of injury/illness/exacerbation    Comorbidities respiratory failure with hypoxia, bipolar disorder, fibromyalgia, CAD, DM type II, diabetic neuropathy, pulmonary HTN, RLS, chronic pain syndrome    Examination-Activity Limitations Locomotion Level;Transfers;Bend;Squat;Stairs;Stand;Lift;Carry    Examination-Participation Restrictions Cleaning;Driving;Laundry;Shop;Meal Prep;Yard Work    [employer] Evolving/Moderate complexity    Rehab Potential Fair    PT Frequency 1x / week    PT Duration 4 weeks   will plan to renew if pt progresses   PT Treatment/Interventions ADLs/Self Care Home Management;Neuromuscular re-education;Gait training;Therapeutic activities;Stair training;Aquatic Therapy;DME Instruction;Balance training;Therapeutic exercise;Manual techniques    PT Next Visit Plan do TUG & gait velocity - begin back HEP    PT Home Exercise Plan see above    Consulted and Agree with Plan of Care Patient             Patient will benefit from skilled therapeutic intervention in order to improve the following deficits and impairments:  Difficulty walking, Decreased balance, Decreased activity tolerance, Decreased strength,  Pain  Visit Diagnosis: Chronic bilateral low back pain without sciatica     Problem List Patient Active Problem List   Diagnosis Date Noted   Yeast infection 01/14/2021   Type 2 diabetes mellitus without complication, with long-term current use of insulin ([place]) 12/25/2020   Acute on chronic diastolic CHF (congestive heart failure)/HFpEF Exacerbation 12/16/2020   Acute respiratory failure with hypoxia ([place]) 12/16/2020   UTI (urinary tract infection) 12/14/2020   Elevated brain natriuretic peptide (BNP) level 12/14/2020   Elevated troponin 12/14/2020   Acute exacerbation of CHF (congestive heart failure) ([place]) 12/13/2020   Chronic post-traumatic stress disorder 12/06/2020   Bipolar I disorder, most recent episode (or current) manic ([place]) 12/06/2020   Tremor 12/06/2020   Senile nuclear sclerosis 12/06/2020   Senile corneal changes 12/06/2020   Psychoses ([place]) 12/06/2020   Presbyopia 12/06/2020   Postmenopausal bleeding 12/06/2020   Postcoital bleeding 12/06/2020   P[place]a 12/06/2020   Pain in joint, shoulder region 12/06/2020   Other psychological or physical stress 12/06/2020   Other acquired hammer toe 12/06/2020   Osteopenia 12/06/2020   Opioid dependence ([place]) 12/06/2020   Non-neoplastic nevus 12/06/2020   Essential hypertriglyceridemia 12/06/2020   Hypermetropia 12/06/2020   Debility 12/06/2020   Cystitis 12/06/2020   Depressed bipolar I disorder in full remission ([place]) 12/06/2020  Benzodiazepine dependence ([place]) 12/06/2020   Benign neoplasm of skin 12/06/2020   Anxiety 12/06/2020   Functional diarrhea 10/26/2020   Diabetes mellitus due to underlying condition with hyperglycemia, with long-term current use of insulin ([place]) 09/24/2020   Dyslipidemia 09/24/2020   Head trauma 09/17/2020   Chronic respiratory failure with hypoxia ([place]) 04/25/2020   Chronic constipation 04/25/2020   Back pain/sacroiliitis--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   Fall at home, initial encounter 04/23/2020   Ambulatory dysfunction--back pain and falls 04/23/2020   Chronic pain syndrome 08/22/2019   Nerve pain 08/22/2019   Myofascial pain dysfunction syndrome 08/22/2019   Dyspnea 08/22/2019   CAD (coronary artery disease)    Hypocalcemia    S/P shoulder replacement, 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   History of adenomatous polyp of colon 05/21/2019   Polypharmacy 05/21/2019   Benign paroxysmal positional vertigo due to bilateral vestibular disorder 05/20/2019   Hyperlipidemia associated with type 2 diabetes mellitus ([place]) 05/20/2019   Vertigo 04/25/2019   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   OSA (obstructive sleep apnea) 02/24/2019   Allergic rhinitis 02/24/2019   Acute on chronic respiratory failure with hypoxia ([place]) 01/22/2019   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   Morbid obesity with alveolar hypoventilation ([place]) 07/06/2018   Obesity (BMI 30-39.9) 07/06/2018   At risk for adverse drug event 07/06/2018   Cardiomegaly 92/06/69   Non-alcoholic fatty liver disease 01/12/2018   Mild intermittent asthma 01/12/2018   Senile osteoporosis 12/15/2017   Atrial fibrillation with RVR ([place]) 05/19/2017   Fibromyalgia 03/19/2017   Migraine headache with aura 02/12/2016   Diabetic neuropathy associated with type 2 diabetes mellitus ([place]) 02/06/2016   Bipolar 1 disorder, manic, moderate ([place]) 10/04/2015   Depression, recurrent ([place]) 10/03/2015   Herpes genitalis in women 07/16/2015   Long term current use of anticoagulant 05/30/2015   Family history of coronary arteriosclerosis 05/30/2015   Chronic diastolic heart failure ([place]) 05/30/2015   Dyspnea on exertion    Essential hypertension    Restless legs 04/27/2015   Lipoma 02/08/2015   Bipolar disorder ([place]) 01/23/2015   Insomnia 01/23/2015   Chronic back pain 01/04/2015   Chronic atrial fibrillation ([place]) 01/04/2015   Type 2 diabetes mellitus ([place])     Hyperlipidemia     Cambelle Suchecki, [place], PT 03/27/2021, 12:03 PM  [facility] [address] [place], [place], [address] Phone: [phone]   Fax:  [phone]  Name: ANMOL PASCHEN MRN: [id number] Date of Birth: 1946-01-29

## 2021-03-28 ENCOUNTER — Other Ambulatory Visit: Payer: Self-pay | Admitting: Neurosurgery

## 2021-03-28 DIAGNOSIS — R278 Other lack of coordination: Secondary | ICD-10-CM

## 2021-03-28 DIAGNOSIS — R030 Elevated blood-pressure reading, without diagnosis of hypertension: Secondary | ICD-10-CM | POA: Diagnosis not present

## 2021-03-28 DIAGNOSIS — L720 Epidermal cyst: Secondary | ICD-10-CM

## 2021-03-28 DIAGNOSIS — M5440 Lumbago with sciatica, unspecified side: Secondary | ICD-10-CM | POA: Diagnosis not present

## 2021-03-28 HISTORY — DX: Other lack of coordination: R27.8

## 2021-03-29 ENCOUNTER — Other Ambulatory Visit: Payer: Self-pay | Admitting: Neurosurgery

## 2021-03-29 DIAGNOSIS — M5440 Lumbago with sciatica, unspecified side: Secondary | ICD-10-CM

## 2021-04-02 ENCOUNTER — Other Ambulatory Visit: Payer: Self-pay

## 2021-04-02 ENCOUNTER — Ambulatory Visit: Payer: Medicare Other | Attending: Orthopedic Surgery | Admitting: Physical Therapy

## 2021-04-02 DIAGNOSIS — M545 Low back pain, unspecified: Secondary | ICD-10-CM | POA: Diagnosis not present

## 2021-04-02 DIAGNOSIS — G8929 Other chronic pain: Secondary | ICD-10-CM | POA: Diagnosis not present

## 2021-04-02 DIAGNOSIS — M6281 Muscle weakness (generalized): Secondary | ICD-10-CM | POA: Diagnosis not present

## 2021-04-03 ENCOUNTER — Other Ambulatory Visit: Payer: Self-pay

## 2021-04-03 ENCOUNTER — Ambulatory Visit (INDEPENDENT_AMBULATORY_CARE_PROVIDER_SITE_OTHER): Payer: Medicare Other | Admitting: Family Medicine

## 2021-04-03 ENCOUNTER — Encounter: Payer: Self-pay | Admitting: Family Medicine

## 2021-04-03 ENCOUNTER — Ambulatory Visit (INDEPENDENT_AMBULATORY_CARE_PROVIDER_SITE_OTHER): Payer: Medicare Other

## 2021-04-03 VITALS — BP 110/75 | HR 88 | Temp 97.5°F | Ht 62.0 in | Wt 191.0 lb

## 2021-04-03 DIAGNOSIS — Z780 Asymptomatic menopausal state: Secondary | ICD-10-CM

## 2021-04-03 DIAGNOSIS — I10 Essential (primary) hypertension: Secondary | ICD-10-CM

## 2021-04-03 DIAGNOSIS — E785 Hyperlipidemia, unspecified: Secondary | ICD-10-CM | POA: Diagnosis not present

## 2021-04-03 DIAGNOSIS — E1111 Type 2 diabetes mellitus with ketoacidosis with coma: Secondary | ICD-10-CM

## 2021-04-03 DIAGNOSIS — E782 Mixed hyperlipidemia: Secondary | ICD-10-CM

## 2021-04-03 DIAGNOSIS — E1169 Type 2 diabetes mellitus with other specified complication: Secondary | ICD-10-CM

## 2021-04-03 DIAGNOSIS — I2510 Atherosclerotic heart disease of native coronary artery without angina pectoris: Secondary | ICD-10-CM | POA: Diagnosis not present

## 2021-04-03 DIAGNOSIS — Z23 Encounter for immunization: Secondary | ICD-10-CM | POA: Diagnosis not present

## 2021-04-03 LAB — BAYER DCA HB A1C WAIVED: HB A1C (BAYER DCA - WAIVED): 6.7 % — ABNORMAL HIGH (ref 4.8–5.6)

## 2021-04-03 MED ORDER — ATORVASTATIN CALCIUM 40 MG PO TABS: 40.0000 mg | ORAL_TABLET | Freq: Every day | ORAL | 3 refills | Status: DC

## 2021-04-03 MED ORDER — INSULIN LISPRO (1 UNIT DIAL) 100 UNIT/ML (KWIKPEN): PEN_INJECTOR | SUBCUTANEOUS | 3 refills | Status: DC

## 2021-04-03 NOTE — Progress Notes (Signed)
Subjective:  Patient ID: Amber Stephenson,  female    DOB: November 27, 1945  Age: 75 y.o.    CC: Medical Management of Chronic Issues   HPI [name] presents for  follow-up of hypertension. Patient has no history of headache chest pain or shortness of breath or recent cough. Patient also denies symptoms of TIA such as numbness weakness lateralizing. Patient denies side effects from medication. States taking it regularly.  Patient also  in for follow-up of elevated cholesterol. Doing well without complaints on current medication. Denies side effects  including myalgia and arthralgia and nausea. Also in today for liver function testing. Currently no chest pain, shortness of breath or other cardiovascular related symptoms noted.  Follow-up of diabetes. Patient does check blood sugar at home. Readings run between 100 and 150 Patient denies symptoms such as excessive hunger or urinary frequency, excessive hunger, nausea No significant hypoglycemic spells noted. Medications reviewed. Pt reports taking them regularly. Pt. denies complication/adverse reaction today.    History [name] has a past medical history of Acute on chronic respiratory failure with hypoxia ([place]) (01/22/2019), Asthma, Atrial fibrillation with RVR (05/19/2017), Bipolar I disorder (01/23/2015), CAD (coronary artery disease), Cardiomegaly (01/12/2018), Chronic anticoagulation (05/30/2015), Chronic atrial fibrillation ([place]) (01/04/2015), Chronic back pain, Chronic diastolic CHF (congestive heart failure) (05/30/2015), Diabetes mellitus, type 2, without complication, Diabetic neuropathy (02/06/2016), Dysrhythmia, Essential hypertension, Herpes genitalis in women (07/16/2015), Hyperlipidemia, Hypoxia (02/01/2019), Insomnia (01/23/2015), Lipoma (02/08/2015), Major depressive disorder, Migraine headache with aura (02/12/2016), 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04/21/2019), NAFL (nonalcoholic fatty liver) (20/94/7096), OSA  (obstructive sleep apnea) (02/24/2019), Pulmonary hypertension, Renal insufficiency, and RLS (restless legs syndrome) (04/27/2015).   She has a past surgical history that includes THIGH SURGERY; Shoulder surgery (Right); Breast reduction surgery; Eye surgery (Right); Hammer toe surgery; LEFT HEART CATH AND CORONARY ANGIOGRAPHY (N/A, 02/03/2018); and Reverse shoulder arthroplasty (Right, 08/19/2019).   Her family history includes Alcohol abuse in her sister; Alzheimer's disease in her father; Diabetes in her brother, mother, and sister; Heart disease in her brother, mother, and sister; Mental illness in her brother.She reports that she has never smoked. She has never used smokeless tobacco. She reports that she does not currently use alcohol. She reports that she does not use drugs.  Current Outpatient Medications on File Prior to Visit  Medication Sig Dispense Refill   acetaminophen (TYLENOL) 325 MG tablet Take 2 tablets (650 mg total) by mouth every 6 (six) hours as needed for mild pain, moderate pain or fever. 12 tablet 0   albuterol (PROVENTIL) (2.5 MG/3ML) 0.083% nebulizer solution Take 3 mLs (2.5 mg total) by nebulization every 6 (six) hours as needed for wheezing or shortness of breath. 150 mL 0   albuterol (VENTOLIN HFA) 108 (90 Base) MCG/ACT inhaler Inhale 2 puffs into the lungs every 6 (six) hours as needed for wheezing or shortness of breath. 1 each 0   apixaban (ELIQUIS) 5 MG TABS tablet Take 1 tablet (5 mg total) by mouth 2 (two) times daily. 60 tablet 0   ARIPiprazole (ABILIFY) 10 MG tablet Take 3 tablets (30 mg total) by mouth daily. 90 tablet 0   carbamazepine (CARBATROL) 100 MG 12 hr capsule Take by mouth at bedtime.     Continuous Blood Gluc Sensor (DEXCOM G6 SENSOR) MISC 1 Device by Does not apply route as directed. 9 each 3   Continuous Blood Gluc Transmit (DEXCOM G6 TRANSMITTER) MISC 1 Device by Does not apply route as directed. 1 each 3   denosumab (PROLIA)  60 MG/ML SOSY injection  Inject 60 mg into the skin every 6 (six) months. 1 mL 1   diltiazem (CARDIZEM CD) 120 MG 24 hr capsule Take 1 capsule (120 mg total) by mouth at bedtime. 90 capsule 2   empagliflozin (JARDIANCE) 10 MG TABS tablet Take 1 tablet (10 mg total) by mouth daily before breakfast. 90 tablet 1   escitalopram (LEXAPRO) 10 MG tablet Take 1 tablet (10 mg total) by mouth daily. 30 tablet 0   fluconazole (DIFLUCAN) 150 MG tablet TAKE 1 TABLET(150 MG) BY MOUTH 1 TIME FOR 1 DOSE. MAY REPEAT AFTER 3 DAYS AS NEEDED 2 tablet 0   flurazepam (DALMANE) 30 MG capsule Take 30 mg by mouth at bedtime as needed.     Fluticasone-Salmeterol (ADVAIR DISKUS) 500-50 MCG/DOSE AEPB Inhale 1 puff into the lungs in the morning and at bedtime. 60 each 0   furosemide (LASIX) 40 MG tablet Take 1 tablet (40 mg total) by mouth every morning. TAKE 1 TABLET EVERY MORNING 30 tablet 3   Galcanezumab-gnlm (EMGALITY) 120 MG/ML SOAJ Administer once a month starting 09/27/2020 1.12 mL 11   HYDROmorphone (DILAUDID) 2 MG tablet Take 1 tablet (2 mg total) by mouth every 6 (six) hours as needed for severe pain. 120 tablet 0   icosapent Ethyl (VASCEPA) 1 g capsule TAKE 2 CAPSULES TWICE A DAY WITH MEALS 120 capsule 0   insulin glargine (LANTUS SOLOSTAR) 100 UNIT/ML Solostar Pen Inject 45 Units into the skin 2 (two) times daily. 30 mL 2   LORazepam (ATIVAN) 1 MG tablet Take 1 tablet (1 mg total) by mouth 2 (two) times daily. And give 2 tablets by mouth at bedtime 90 tablet 0   meclizine (ANTIVERT) 12.5 MG tablet Take 1 tablet (12.5 mg total) by mouth 3 (three) times daily as needed for dizziness. 30 tablet 0   metFORMIN (GLUCOPHAGE) 500 MG tablet TAKE 1 TABLET(500 MG) BY MOUTH TWICE DAILY AFTER A MEAL 180 tablet 3   metoprolol succinate (TOPROL XL) 25 MG 24 hr tablet Take 1 tablet (25 mg total) by mouth in the morning and at bedtime. 180 tablet 3   metoprolol succinate (TOPROL-XL) 100 MG 24 hr tablet Take 1 tablet (100 mg total) by mouth in the morning and  at bedtime. 180 tablet 3   metroNIDAZOLE (METROGEL) 0.75 % gel Apply topically.     NON FORMULARY Diet: _____ Regular, ___  __ NAS, ___x____Consistent Carbohydrate, _______NPO _____Other     polyethylene glycol (MIRALAX / GLYCOLAX) 17 g packet Take 17 g by mouth daily. 14 each 0   potassium chloride SA (KLOR-CON) 20 MEQ tablet Take 1 tablet (20 mEq total) by mouth daily. 30 tablet 0   pregabalin (LYRICA) 200 MG capsule Take 1 capsule (200 mg total) by mouth 2 (two) times daily. 60 capsule 5   Semaglutide (RYBELSUS) 14 MG TABS Take 14 mg by mouth daily. 90 tablet 1   tiZANidine (ZANAFLEX) 4 MG capsule TAKE 1 CAPSULE BY MOUTH THREE TIMES DAILY AS NEEDED FOR MUSCLE SPASMS 270 capsule 0   traZODone (DESYREL) 100 MG tablet Take 400 mg by mouth at bedtime.     triamcinolone cream (KENALOG) 0.1 % SMARTSIG:1 Application Topical 2-3 Times Daily     Vitamin D, Cholecalciferol, 25 MCG (1000 UT) TABS Take 1 tablet by mouth daily in the afternoon.     Current Facility-Administered Medications on File Prior to Visit  Medication Dose Route Frequency Provider Last Rate Last Admin   bupivacaine-epinephrine (MARCAINE W/   EPI) 0.5% -1:200000 injection    Anesthesia Intra-op Effie Berkshire, MD   12 mL at 01/21/19 0935    ROS Review of Systems  Constitutional: Negative.   HENT: Negative.    Eyes:  Negative for visual disturbance.  Respiratory:  Negative for shortness of breath.   Cardiovascular:  Negative for chest pain.  Gastrointestinal:  Negative for abdominal pain.  Musculoskeletal:  Negative for arthralgias.   Objective:  BP 110/75   Pulse 88   Temp (!) 97.5 F (36.4 C)   Ht 5' 2" (1.575 m)   Wt 191 lb (86.6 kg)   SpO2 94%   BMI 34.93 kg/m   BP Readings from Last 3 Encounters:  04/03/21 110/75  03/27/21 134/90  02/28/21 122/70    Wt Readings from Last 3 Encounters:  04/03/21 191 lb (86.6 kg)  03/27/21 194 lb 12.8 oz (88.4 kg)  02/28/21 197 lb (89.4 kg)     Physical  Exam Constitutional:      General: She is not in acute distress.    Appearance: She is well-developed.  Cardiovascular:     Rate and Rhythm: Normal rate and regular rhythm.  Pulmonary:     Breath sounds: Normal breath sounds.  Musculoskeletal:        General: Normal range of motion.  Skin:    General: Skin is warm and dry.  Neurological:     Mental Status: She is alert and oriented to person, place, and time.     Assessment & Plan:   Tekeyah was seen today for medical management of chronic issues.  Diagnoses and all orders for this visit:  Need for immunization against influenza -     Flu Vaccine QUAD High Dose(Fluad)  Mixed hyperlipidemia -     Lipid panel  Type 2 diabetes mellitus with ketoacidotic coma, without long-term current use of insulin (HCC) -     Bayer DCA Hb A1c Waived -     CBC with Differential/Platelet  Essential hypertension -     CMP14+EGFR  Hyperlipidemia associated with type 2 diabetes mellitus (HCC) -     atorvastatin (LIPITOR) 40 MG tablet; Take 1 tablet (40 mg total) by mouth daily.  Other orders -     insulin lispro (HUMALOG KWIKPEN) 100 UNIT/ML KwikPen; SSI changed by endocrine  I have changed [name]. Lindler's atorvastatin. I am also having her maintain her polyethylene glycol, NON FORMULARY, Vitamin D (Cholecalciferol), albuterol, albuterol, ARIPiprazole, apixaban, escitalopram, Fluticasone-Salmeterol, LORazepam, meclizine, potassium chloride SA, icosapent Ethyl, Prolia, carbamazepine, metoprolol succinate, diltiazem, Rybelsus, Lantus SoloStar, Emgality, pregabalin, metoprolol succinate, flurazepam, traZODone, furosemide, acetaminophen, Dexcom G6 Sensor, tiZANidine, empagliflozin, metFORMIN, fluconazole, metroNIDAZOLE, triamcinolone cream, HYDROmorphone, Dexcom G6 Transmitter, and insulin lispro.  Meds ordered this encounter  Medications   atorvastatin (LIPITOR) 40 MG tablet    Sig: Take 1 tablet (40 mg total) by mouth daily.    Dispense:   90 tablet    Refill:  3   insulin lispro (HUMALOG KWIKPEN) 100 UNIT/ML KwikPen    Sig: SSI changed by endocrine    Dispense:  15 mL    Refill:  3     Follow-up: Return in about 3 months (around 07/04/2021), or if symptoms worsen or fail to improve.  [name], M.D.

## 2021-04-03 NOTE — Therapy (Signed)
[facility] [address] [place], [place], [address] Phone: [phone]   Fax:  [phone]  Physical Therapy Treatment  Patient Details  Name: Amber Stephenson MRN: [id number] Date of Birth: Jan 26, 1946 Referring Provider (PT): Netta Cedars, MD   Encounter Date: 04/02/2021   PT End of Session - 04/03/21 2102     Visit Number 4    Number of Visits 5    Date for PT Re-Evaluation 04/16/21    Authorization Type Medicare    Authorization Time Period 02-14-21 - 04-16-21    PT Start Time 1535    PT Stop Time 1602   pt requested to end session early due to c/o back pain   PT Time Calculation (min) 27 min    Activity Tolerance Patient limited by pain    Behavior During Therapy [facility] for tasks assessed/performed             Past Medical History:  Diagnosis Date   Acute on chronic respiratory failure with hypoxia ([place]) 01/22/2019   Asthma    Atrial fibrillation with RVR 05/19/2017   Bipolar I disorder 01/23/2015   CAD (coronary artery disease)    Nonobstructive; Managed by Dr. Bronson Ing   Cardiomegaly 01/12/2018   Chronic anticoagulation 05/30/2015   Chronic atrial fibrillation ([place]) 01/04/2015   Chronic back pain    Lower back   Chronic diastolic CHF (congestive heart failure) 05/30/2015   Diabetes mellitus, type 2, without complication    Diabetic neuropathy 02/06/2016   Dysrhythmia    A-Fib   Essential hypertension    Herpes genitalis in women 07/16/2015   Hyperlipidemia    Hypoxia 02/01/2019   Insomnia 01/23/2015   Lipoma 02/08/2015   Major depressive disorder    Migraine headache with aura 02/12/2016   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   NAFL (nonalcoholic fatty liver) 38/33/3832   OSA (obstructive sleep apnea) 02/24/2019   10/09/2018 - HST  - AHI 40.6    Pulmonary hypertension    Renal insufficiency    Managed by Dr. Wallace Keller   RLS (restless legs syndrome) 04/27/2015    Past Surgical  History:  Procedure Laterality Date   BREAST REDUCTION SURGERY     EYE SURGERY Right    cateracts   HAMMER TOE SURGERY     LEFT HEART CATH AND CORONARY ANGIOGRAPHY N/A 02/03/2018   Procedure: LEFT HEART CATH AND CORONARY ANGIOGRAPHY;  Surgeon: Martinique, Peter M, MD;  Location: [facility];  Service: Cardiovascular;  Laterality: N/A;   REVERSE SHOULDER ARTHROPLASTY Right 08/19/2019   Procedure: REVERSE SHOULDER ARTHROPLASTY;  Surgeon: Netta Cedars, MD;  Location: [facility];  Service: Orthopedics;  Laterality: Right;  interscalene block   SHOULDER SURGERY Right    "I BROKE MY SHOUDLER   THIGH SURGERY     "TO REMOVE A TUMOR "    There were no vitals filed for this visit.   Subjective Assessment - 04/02/21 1529     Subjective Pt reports she has yeast infection and feels uncomfortable - is very trembly - has been going on about 3 days; states she is going to have epidural - waiting for it to be scheduled    Pertinent History CAD, Chronic pain syndrome, bipolar disorder, fibromyalgia, chronic respiratory failure with hypoxia, DM type II, diabetic neuropathy, pulmonary HTN, RLS, HTN    How long can you sit comfortably? no problems with sitting    How long can you stand comfortably? 3"-4"    How long  can you walk comfortably? unable    Patient Stated Goals pt able to stand longer, exercise, and decrease pain    Currently in Pain? Yes    Pain Score 7     Pain Location Back    Pain Orientation Left;Right    Pain Descriptors / Indicators Aching;Discomfort;Dull    Pain Type Chronic pain    Pain Onset More than a month ago    Pain Frequency Constant    Pain Onset In the past 7 days                               [facility] Adult PT Treatment/Exercise - 04/03/21 0001       Exercises   Exercises Knee/Hip      Lumbar Exercises: Stretches   Single Knee to Chest Stretch Right;Left;2 reps;20 seconds   passive stretch by PT   Double Knee to Chest Stretch 1 rep;20 seconds     Lower Trunk Rotation 20 seconds;1 rep   to Lt side for Rt side stretch   Pelvic Tilt 5 reps    Other Lumbar Stretch Exercise Pt rolled blue physioball straight out in front, for trunk flexion, to achieve low back stretch - held for 15 secs x 3 reps; then rolled ball to Lt side for Rt side stretch - 15 sec hold x 2 reps      Knee/Hip Exercises: Supine   Heel Slides AAROM;Right;Left   5 reps   Straight Leg Raises AAROM;Right;Left;1 set;5 reps                       PT Short Term Goals - 04/03/21 2108       PT SHORT TERM GOAL #1   Title same as LTG's               PT Long Term Goals - 04/03/21 2108       PT LONG TERM GOAL #1   Title Patient will be independent with HEP.    Time 4    Period Weeks    Status New      PT LONG TERM GOAL #2   Title Pt will improve TUG score by at least 4 secs to demo improved functional mobility.    Time 4    Period Weeks    Status New      PT LONG TERM GOAL #3   Title Pt will report at least 25% improvement in back pain to increase ease with ADL's and mobility.    Time 4    Period Weeks    Status New      PT LONG TERM GOAL #4   Title Pt will amb. 42' with SPC with c/o back pain </= 5/10 to increase independence & safety with community mobility.    Baseline 5' with SPC on 02-14-21 with pain 8/10 intensity    Time 4    Period Weeks    Status New      PT LONG TERM GOAL #5   Title Increase gait velocity by at least .4 ft/sec with use of SPC for incr. gait efficiency.    Time 4    Period Weeks    Status New                   Plan - 04/03/21 2104     Clinical Impression Statement Pt continues to c/o low back pain as well  as pain in Lt lateral thigh due to hematoma due to fall sustained a week ago.  Mobility is limited by c/o pain; pt does report that she will be having epidural which is to be scheduled.  Plan is to place pt on hold until after epidural is received and pt is to call for additional appts after  procedure.    Personal Factors and Comorbidities Comorbidity 2;Fitness;Behavior Pattern;Past/Current Experience;Time since onset of injury/illness/exacerbation    Comorbidities respiratory failure with hypoxia, bipolar disorder, fibromyalgia, CAD, DM type II, diabetic neuropathy, pulmonary HTN, RLS, chronic pain syndrome    Examination-Activity Limitations Locomotion Level;Transfers;Bend;Squat;Stairs;Stand;Lift;Carry    Examination-Participation Restrictions Cleaning;Driving;Laundry;Shop;Meal Prep;Yard Work    [employer] Evolving/Moderate complexity    Rehab Potential Fair    PT Frequency 1x / week    PT Duration 4 weeks   will plan to renew if pt progresses   PT Treatment/Interventions ADLs/Self Care Home Management;Neuromuscular re-education;Gait training;Therapeutic activities;Stair training;Aquatic Therapy;DME Instruction;Balance training;Therapeutic exercise;Manual techniques    PT Next Visit Plan do TUG & gait velocity - begin back HEP    PT Home Exercise Plan see above    Consulted and Agree with Plan of Care Patient             Patient will benefit from skilled therapeutic intervention in order to improve the following deficits and impairments:  Difficulty walking, Decreased balance, Decreased activity tolerance, Decreased strength, Pain  Visit Diagnosis: Chronic bilateral low back pain without sciatica  Muscle weakness (generalized)     Problem List Patient Active Problem List   Diagnosis Date Noted   Yeast infection 01/14/2021   Type 2 diabetes mellitus without complication, with long-term current use of insulin ([place]) 12/25/2020   Acute on chronic diastolic CHF (congestive heart failure)/HFpEF Exacerbation 12/16/2020   Acute respiratory failure with hypoxia ([place]) 12/16/2020   UTI (urinary tract infection) 12/14/2020   Elevated brain natriuretic peptide (BNP) level 12/14/2020   Elevated troponin 12/14/2020   Acute exacerbation of CHF  (congestive heart failure) ([place]) 12/13/2020   Chronic post-traumatic stress disorder 12/06/2020   Bipolar I disorder, most recent episode (or current) manic ([place]) 12/06/2020   Tremor 12/06/2020   Senile nuclear sclerosis 12/06/2020   Senile corneal changes 12/06/2020   Psychoses ([place]) 12/06/2020   Presbyopia 12/06/2020   Postmenopausal bleeding 12/06/2020   Postcoital bleeding 12/06/2020   Pinguecula 12/06/2020   Pain in joint, shoulder region 12/06/2020   Other psychological or physical stress 12/06/2020   Other acquired hammer toe 12/06/2020   Osteopenia 12/06/2020   Opioid dependence ([employer]) 12/06/2020   Non-neoplastic nevus 12/06/2020   Essential hypertriglyceridemia 12/06/2020   Hypermetropia 12/06/2020   Debility 12/06/2020   Cystitis 12/06/2020   Depressed bipolar I disorder in full remission ([place]) 12/06/2020   Benzodiazepine dependence ([place]) 12/06/2020   Benign neoplasm of skin 12/06/2020   Anxiety 12/06/2020   Functional diarrhea 10/26/2020   Diabetes mellitus due to underlying condition with hyperglycemia, with long-term current use of insulin ([place]) 09/24/2020   Dyslipidemia 09/24/2020   Head trauma 09/17/2020   Chronic respiratory failure with hypoxia ([place]) 04/25/2020   Chronic constipation 04/25/2020   Back pain/sacroiliitis--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   Fall at home, initial encounter 04/23/2020   Ambulatory dysfunction--back pain and falls 04/23/2020   Chronic pain syndrome 08/22/2019   Nerve pain 08/22/2019   Myofascial pain dysfunction syndrome 08/22/2019   Dyspnea 08/22/2019   CAD (coronary artery disease)  Hypocalcemia    S/P shoulder replacement, 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   History of adenomatous polyp of colon 05/21/2019   Polypharmacy 05/21/2019   Benign paroxysmal positional vertigo due to bilateral vestibular disorder 05/20/2019   Hyperlipidemia associated with type 2 diabetes mellitus ([place])  05/20/2019   Vertigo 04/25/2019   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   OSA (obstructive sleep apnea) 02/24/2019   Allergic rhinitis 02/24/2019   Acute on chronic respiratory failure with hypoxia ([place]) 01/22/2019   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   Morbid obesity with alveolar hypoventilation ([place]) 07/06/2018   Obesity (BMI 30-39.9) 07/06/2018   At risk for adverse drug event 07/06/2018   Cardiomegaly 53/74/8270   Non-alcoholic fatty liver disease 01/12/2018   Mild intermittent asthma 01/12/2018   Senile osteoporosis 12/15/2017   Atrial fibrillation with RVR ([place]) 05/19/2017   Fibromyalgia 03/19/2017   Migraine headache with aura 02/12/2016   Diabetic neuropathy associated with type 2 diabetes mellitus ([place]) 02/06/2016   Bipolar 1 disorder, manic, moderate ([place]) 10/04/2015   Depression, recurrent ([place]) 10/03/2015   Herpes genitalis in women 07/16/2015   Long term current use of anticoagulant 05/30/2015   Family history of coronary arteriosclerosis 05/30/2015   Chronic diastolic heart failure ([place]) 05/30/2015   Dyspnea on exertion    Essential hypertension    Restless legs 04/27/2015   Lipoma 02/08/2015   Bipolar disorder ([place]) 01/23/2015   Insomnia 01/23/2015   Chronic back pain 01/04/2015   Chronic atrial fibrillation ([place]) 01/04/2015   Type 2 diabetes mellitus ([place])    Hyperlipidemia     Mirayah Wren, [place], PT 04/03/2021, 9:09 PM  [facility] [address] [place], [place], [address] Phone: [phone]   Fax:  [phone]  Name: ZAMYAH WIESMAN MRN: [id number] Date of Birth: 09-May-1946

## 2021-04-04 ENCOUNTER — Other Ambulatory Visit: Payer: Self-pay

## 2021-04-04 ENCOUNTER — Telehealth: Payer: Self-pay | Admitting: *Deleted

## 2021-04-04 DIAGNOSIS — M79676 Pain in unspecified toe(s): Secondary | ICD-10-CM | POA: Diagnosis not present

## 2021-04-04 DIAGNOSIS — B351 Tinea unguium: Secondary | ICD-10-CM | POA: Diagnosis not present

## 2021-04-04 DIAGNOSIS — E1142 Type 2 diabetes mellitus with diabetic polyneuropathy: Secondary | ICD-10-CM | POA: Diagnosis not present

## 2021-04-04 DIAGNOSIS — L84 Corns and callosities: Secondary | ICD-10-CM | POA: Diagnosis not present

## 2021-04-04 MED ORDER — DEXCOM G6 TRANSMITTER MISC: 1.0000 | 3 refills | Status: DC

## 2021-04-04 NOTE — Telephone Encounter (Signed)
Script sent  

## 2021-04-04 NOTE — Telephone Encounter (Signed)
Fax from [facility]: Insulin Lispro 100u/ml [employer] Ins plan requires specific directions to process prescription Please sent new Rx w/ freq of how pt will be using medication & days supply limitations

## 2021-04-05 ENCOUNTER — Other Ambulatory Visit (HOSPITAL_COMMUNITY): Payer: Self-pay | Admitting: Physical Medicine and Rehabilitation

## 2021-04-05 DIAGNOSIS — M85852 Other specified disorders of bone density and structure, left thigh: Secondary | ICD-10-CM | POA: Diagnosis not present

## 2021-04-05 DIAGNOSIS — M85832 Other specified disorders of bone density and structure, left forearm: Secondary | ICD-10-CM | POA: Diagnosis not present

## 2021-04-05 LAB — CBC WITH DIFFERENTIAL/PLATELET
Basophils Absolute: 0.1 10*3/uL (ref 0.0–0.2)
Basos: 1 %
EOS (ABSOLUTE): 0.1 10*3/uL (ref 0.0–0.4)
Eos: 1 %
Hematocrit: 49.4 % — ABNORMAL HIGH (ref 34.0–46.6)
Hemoglobin: 16.2 g/dL — ABNORMAL HIGH (ref 11.1–15.9)
Immature Grans (Abs): 0 10*3/uL (ref 0.0–0.1)
Immature Granulocytes: 0 %
Lymphocytes Absolute: 3 10*3/uL (ref 0.7–3.1)
Lymphs: 24 %
MCH: 28.5 pg (ref 26.6–33.0)
MCHC: 32.8 g/dL (ref 31.5–35.7)
MCV: 87 fL (ref 79–97)
Monocytes Absolute: 0.9 10*3/uL (ref 0.1–0.9)
Monocytes: 7 %
Neutrophils Absolute: 8.5 10*3/uL — ABNORMAL HIGH (ref 1.4–7.0)
Neutrophils: 67 %
Platelets: 222 10*3/uL (ref 150–450)
RBC: 5.69 x10E6/uL — ABNORMAL HIGH (ref 3.77–5.28)
RDW: 13.9 % (ref 11.7–15.4)
WBC: 12.6 10*3/uL — ABNORMAL HIGH (ref 3.4–10.8)

## 2021-04-05 LAB — CMP14+EGFR
ALT: 11 IU/L (ref 0–32)
AST: 19 IU/L (ref 0–40)
Albumin/Globulin Ratio: 1.6 (ref 1.2–2.2)
Albumin: 4.5 g/dL (ref 3.7–4.7)
Alkaline Phosphatase: 141 IU/L — ABNORMAL HIGH (ref 44–121)
BUN/Creatinine Ratio: 10 — ABNORMAL LOW (ref 12–28)
BUN: 14 mg/dL (ref 8–27)
Bilirubin Total: 0.4 mg/dL (ref 0.0–1.2)
CO2: 24 mmol/L (ref 20–29)
Calcium: 9.5 mg/dL (ref 8.7–10.3)
Chloride: 94 mmol/L — ABNORMAL LOW (ref 96–106)
Creatinine, Ser: 1.35 mg/dL — ABNORMAL HIGH (ref 0.57–1.00)
Globulin, Total: 2.8 g/dL (ref 1.5–4.5)
Glucose: 273 mg/dL — ABNORMAL HIGH (ref 70–99)
Potassium: 3.8 mmol/L (ref 3.5–5.2)
Sodium: 137 mmol/L (ref 134–144)
Total Protein: 7.3 g/dL (ref 6.0–8.5)
eGFR: 41 mL/min/{1.73_m2} — ABNORMAL LOW (ref >59)

## 2021-04-05 LAB — LIPID PANEL
Chol/HDL Ratio: 3.9 ratio (ref 0.0–4.4)
Cholesterol, Total: 161 mg/dL (ref 100–199)
HDL: 41 mg/dL (ref >39)
LDL Chol Calc (NIH): 70 mg/dL (ref 0–99)
Triglycerides: 311 mg/dL — ABNORMAL HIGH (ref 0–149)
VLDL Cholesterol Cal: 50 mg/dL — ABNORMAL HIGH (ref 5–40)

## 2021-04-05 NOTE — Telephone Encounter (Signed)
[facility] sent a refill request for Trazodone. Would you like to refill?

## 2021-04-07 ENCOUNTER — Other Ambulatory Visit: Payer: Self-pay | Admitting: Family Medicine

## 2021-04-07 MED ORDER — INSULIN LISPRO (1 UNIT DIAL) 100 UNIT/ML (KWIKPEN): PEN_INJECTOR | SUBCUTANEOUS | 3 refills | Status: DC

## 2021-04-07 NOTE — Telephone Encounter (Signed)
Please let the patient know that I sent their prescription to their pharmacy. Thanks, WS 

## 2021-04-08 ENCOUNTER — Other Ambulatory Visit: Payer: Self-pay | Admitting: *Deleted

## 2021-04-08 ENCOUNTER — Telehealth: Payer: Self-pay | Admitting: Family Medicine

## 2021-04-08 ENCOUNTER — Other Ambulatory Visit: Payer: Self-pay | Admitting: Family Medicine

## 2021-04-08 MED ORDER — ALENDRONATE SODIUM 70 MG PO TABS: 70.0000 mg | ORAL_TABLET | ORAL | 11 refills | Status: DC

## 2021-04-08 NOTE — Telephone Encounter (Signed)
Patient has been notified of results.  

## 2021-04-08 NOTE — Progress Notes (Signed)
Hello Mathea,  Your lab result is normal and/or stable.Some minor variations that are not significant are commonly marked abnormal, but do not represent any medical problem for you.  Best regards, Julien Berryman, M.D.

## 2021-04-08 NOTE — Progress Notes (Signed)
DEXA shows osteopenia. I recommend weekly fosamax. ?Nurse, if pt. Is agreeable, send in Fosamax 70 mg weekly, #13. ? ?Thanks, ?WS ?

## 2021-04-09 ENCOUNTER — Telehealth: Payer: Self-pay | Admitting: Family Medicine

## 2021-04-09 NOTE — Telephone Encounter (Signed)
Spoke with patient.

## 2021-04-12 ENCOUNTER — Telehealth: Payer: Self-pay | Admitting: *Deleted

## 2021-04-12 NOTE — Telephone Encounter (Signed)
Pts last documented Prolia was 10/27/19. Does she want this?  Her insurance does cover at [employer] cost  If she does Prolia - do not schedule her yet, let Lead know to order med and and once we have it, we can call and get her scheduled for injection.

## 2021-04-15 ENCOUNTER — Encounter
Payer: Medicare Other | Attending: Physical Medicine and Rehabilitation | Admitting: Physical Medicine and Rehabilitation

## 2021-04-15 ENCOUNTER — Other Ambulatory Visit: Payer: Self-pay

## 2021-04-15 ENCOUNTER — Encounter: Payer: Self-pay | Admitting: Physical Medicine and Rehabilitation

## 2021-04-15 VITALS — BP 123/86 | HR 84 | Ht 62.0 in | Wt 195.2 lb

## 2021-04-15 DIAGNOSIS — J45901 Unspecified asthma with (acute) exacerbation: Secondary | ICD-10-CM | POA: Diagnosis not present

## 2021-04-15 DIAGNOSIS — M5441 Lumbago with sciatica, right side: Secondary | ICD-10-CM | POA: Diagnosis not present

## 2021-04-15 DIAGNOSIS — G8929 Other chronic pain: Secondary | ICD-10-CM | POA: Insufficient documentation

## 2021-04-15 MED ORDER — HYDROMORPHONE HCL 2 MG PO TABS: 2.0000 mg | ORAL_TABLET | Freq: Four times a day (QID) | ORAL | 0 refills | Status: DC | PRN

## 2021-04-15 NOTE — Patient Instructions (Signed)
  Plan: 1. Go home and take inhaler- 2 puffs- take 1 puff and wait 1 minute and then take another- every 4-6 hours.  Albuterol. Check pulse ox- if <90% >15 minutes- then go to ER   2. Patient here for trigger point injections for  Consent done and on chart.  Cleaned areas with alcohol and injected using a 27 gauge 1.5 inch needle  Injected  Using 1% Lidocaine with no EPI  Upper traps B/L  Levators- B/L  Posterior scalenes- B/L  Middle scalenes Splenius Capitus Pectoralis Major Rhomboids- B/L  Infraspinatus Teres Major/minor Thoracic paraspinals Lumbar paraspinals- R x4 Other injections-      There was no bleeding or complications.  Patient was advised to drink a lot of water on day after injections to flush system Will have increased soreness for 12-48 hours after injections.  Can use Lidocaine patches the day AFTER injections Can use theracane on day of injections in places didn't inject Can use heating pad 4-6 hours AFTER injections  3. F/U in 3 months-   4. Refill Dilaudid 2 mg QID prn- #120- is due 10/24, but sent in early so doesn't have to call in for refill this month.

## 2021-04-15 NOTE — Progress Notes (Signed)
Patient is a 75 yr old female with hx of  CAD, pulmonary HTN- and DM- with CKD- Stage III here for right low back pain with sciatica  As well as upper back/neck pain/myofascial pain. Pt is here for f/u on chronic pain Also having SOB/asthma   Pt rpeorts SOB x 3 days- has appt with PCP in AM- has taken inhaler 2x in last 3 days- last time 9am this AM.   C/O SOB- O2 sats bouncing from 84-96% depending on breathing through nose or pursed lip breathing.    Averages about 3 pills of pain meds/day.  Last Rx 03/25/21  Exam: Awake, alert, appropriate, appears SOB- accompanied by husband, no acute distress, but appears SOB at rest Pulm- tight sounding and decreased air movement B/L from mid lung downwards MS: Trigger points in neck/upper back and low back Got hiccups   Plan: 1. Go home and take inhaler- 2 puffs- take 1 puff and wait 1 minute and then take another- every 4-6 hours.  Albuterol. Check pulse ox- if <90% >15 minutes- then go to ER   2. Patient here for trigger point injections for  Consent done and on chart.  Cleaned areas with alcohol and injected using a 27 gauge 1.5 inch needle  Injected  Using 1% Lidocaine with no EPI  Upper traps B/L  Levators- B/L  Posterior scalenes- B/L  Middle scalenes Splenius Capitus Pectoralis Major Rhomboids- B/L  Infraspinatus Teres Major/minor Thoracic paraspinals Lumbar paraspinals- R x4 Other injections-      There was no bleeding or complications.  Patient was advised to drink a lot of water on day after injections to flush system Will have increased soreness for 12-48 hours after injections.  Can use Lidocaine patches the day AFTER injections Can use theracane on day of injections in places didn't inject Can use heating pad 4-6 hours AFTER injections  3. F/U in 3 months-   4. Refill Dilaudid 2 mg QID prn- #120- is due 10/24, but sent in early so doesn't have to call in for refill this month.

## 2021-04-16 ENCOUNTER — Encounter: Payer: Self-pay | Admitting: Nurse Practitioner

## 2021-04-16 ENCOUNTER — Ambulatory Visit (INDEPENDENT_AMBULATORY_CARE_PROVIDER_SITE_OTHER): Payer: Medicare Other | Admitting: Nurse Practitioner

## 2021-04-16 ENCOUNTER — Telehealth: Payer: Self-pay

## 2021-04-16 VITALS — BP 124/72 | HR 80 | Temp 97.1°F | Resp 20 | Ht 62.0 in | Wt 192.0 lb

## 2021-04-16 DIAGNOSIS — N898 Other specified noninflammatory disorders of vagina: Secondary | ICD-10-CM | POA: Diagnosis not present

## 2021-04-16 DIAGNOSIS — R300 Dysuria: Secondary | ICD-10-CM | POA: Insufficient documentation

## 2021-04-16 HISTORY — DX: Dysuria: R30.0

## 2021-04-16 LAB — URINALYSIS, COMPLETE
Bilirubin, UA: NEGATIVE
Ketones, UA: NEGATIVE
Leukocytes,UA: NEGATIVE
Nitrite, UA: NEGATIVE
Protein,UA: NEGATIVE
RBC, UA: NEGATIVE
Specific Gravity, UA: 1.01 (ref 1.005–1.030)
Urobilinogen, Ur: 0.2 mg/dL (ref 0.2–1.0)
pH, UA: 6 (ref 5.0–7.5)

## 2021-04-16 LAB — MICROSCOPIC EXAMINATION: Renal Epithel, UA: NONE SEEN /hpf

## 2021-04-16 MED ORDER — FLUCONAZOLE 150 MG PO TABS
150.0000 mg | ORAL_TABLET | Freq: Once | ORAL | 0 refills | Status: AC
Start: 2021-04-16 — End: 2021-04-16

## 2021-04-16 NOTE — Assessment & Plan Note (Signed)
Vaginal itching not well controlled, fluconazole 150 mg tablet by mouth once.  Education provided to patient printed handouts given.  Rx sent to pharmacy, follow-up with unresolved symptoms.  Completed wet prep results pending.

## 2021-04-16 NOTE — Patient Instructions (Signed)
Dysuria ?Dysuria is pain or discomfort during urination. The pain or discomfort may be felt in the part of the body that drains urine from the bladder (urethra) or in the surrounding tissue of the genitals. The pain may also be felt in the groin area, lower abdomen, or lower back. ?You may have to urinate frequently or have the sudden feeling that you have to urinate (urgency). Dysuria can affect anyone, but it is more common in females. Dysuria can be caused by many different things, including: ?Urinary tract infection. ?Kidney stones or bladder stones. ?Certain STIs (sexually transmitted infections), such as chlamydia. ?Dehydration. ?Inflammation of the tissues of the vagina. ?Use of certain medicines. ?Use of certain soaps or scented products that cause irritation. ?Follow these instructions at home: ?Medicines ?Take over-the-counter and prescription medicines only as told by your health care provider. ?If you were prescribed an antibiotic medicine, take it as told by your health care provider. Do not stop taking the antibiotic even if you start to feel better. ?Eating and drinking ? ?Drink enough fluid to keep your urine pale yellow. ?Avoid caffeinated beverages, tea, and alcohol. These beverages can irritate the bladder and make dysuria worse. In males, alcohol may irritate the prostate. ?General instructions ?Watch your condition for any changes. ?Urinate often. Avoid holding urine for long periods of time. ?If you are female, you should wipe from front to back after urinating or having a bowel movement. Use each piece of toilet paper only once. ?Empty your bladder after sex. ?Keep all follow-up visits. This is important. ?If you had any tests done to find the cause of dysuria, it is up to you to get your test results. Ask your health care provider, or the department that is doing the test, when your results will be ready. ?Contact a health care provider if: ?You have a fever. ?You develop pain in your back or  sides. ?You have nausea or vomiting. ?You have blood in your urine. ?You are not urinating as often as you usually do. ?Get help right away if: ?Your pain is severe and not relieved with medicines. ?You cannot eat or drink without vomiting. ?You are confused. ?You have a rapid heartbeat while resting. ?You have shaking or chills. ?You feel extremely weak. ?Summary ?Dysuria is pain or discomfort while urinating. Many different conditions can lead to dysuria. ?If you have dysuria, you may have to urinate frequently or have the sudden feeling that you have to urinate (urgency). ?Watch your condition for any changes. Keep all follow-up visits. ?Make sure that you urinate often and drink enough fluid to keep your urine pale yellow. ?This information is not intended to replace advice given to you by your health care provider. Make sure you discuss any questions you have with your health care provider. ?Document Revised: 12/30/2019 Document Reviewed: 12/30/2019 ?Elsevier Patient Education ? 2022 [employer] ? ?

## 2021-04-16 NOTE — Progress Notes (Signed)
Acute Office Visit  Subjective:    Patient ID: Amber Stephenson, female    DOB: [birth year], 75 y.o.   MRN: [id number]  Chief Complaint  Patient presents with   Urinary Tract Infection    Urinary Tract Infection  This is a new problem. Episode onset: 4 days ago. The problem occurs every urination. The problem has been unchanged. The quality of the pain is described as burning and aching. The pain is at a severity of 10/10. The pain is severe. There has been no fever. She is Not sexually active. Associated symptoms include a discharge and flank pain. Pertinent negatives include no nausea or vomiting. She has tried nothing for the symptoms. The treatment provided no relief.    Past Medical History:  Diagnosis Date   Acute on chronic respiratory failure with hypoxia ([place]) 01/22/2019   Asthma    Atrial fibrillation with RVR 05/19/2017   Bipolar I disorder 01/23/2015   CAD (coronary artery disease)    Nonobstructive; Managed by Dr. Bronson Ing   Cardiomegaly 01/12/2018   Chronic anticoagulation 05/30/2015   Chronic atrial fibrillation ([place]) 01/04/2015   Chronic back pain    Lower back   Chronic diastolic CHF (congestive heart failure) 05/30/2015   Diabetes mellitus, type 2, without complication    Diabetic neuropathy 02/06/2016   Dysrhythmia    A-Fib   Essential hypertension    Herpes genitalis in women 07/16/2015   Hyperlipidemia    Hypoxia 02/01/2019   Insomnia 01/23/2015   Lipoma 02/08/2015   Major depressive disorder    Migraine headache with aura 02/12/2016   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   NAFL (nonalcoholic fatty liver) 40/98/1191   OSA (obstructive sleep apnea) 02/24/2019   10/09/2018 - HST  - AHI 40.6    Pulmonary hypertension    Renal insufficiency    Managed by Dr. Wallace Keller   RLS (restless legs syndrome) 04/27/2015    Past Surgical History:  Procedure Laterality Date   BREAST REDUCTION SURGERY     EYE SURGERY Right    cateracts   HAMMER TOE  SURGERY     LEFT HEART CATH AND CORONARY ANGIOGRAPHY N/A 02/03/2018   Procedure: LEFT HEART CATH AND CORONARY ANGIOGRAPHY;  Surgeon: Martinique, Peter M, MD;  Location: [facility];  Service: Cardiovascular;  Laterality: N/A;   REVERSE SHOULDER ARTHROPLASTY Right 08/19/2019   Procedure: REVERSE SHOULDER ARTHROPLASTY;  Surgeon: Netta Cedars, MD;  Location: [facility];  Service: Orthopedics;  Laterality: Right;  interscalene block   SHOULDER SURGERY Right    "I BROKE MY SHOUDLER   THIGH SURGERY     "TO REMOVE A TUMOR "    Family History  Problem Relation Age of Onset   Diabetes Mother    Heart disease Mother    Heart disease Brother    Heart disease Sister        CABG   Diabetes Sister    Alzheimer's disease Father    Alcohol abuse Sister    Mental illness Brother    Diabetes Brother     Social History   Socioeconomic History   Marital status: Married    Spouse name: Orpah Greek   Number of children: 3   Years of education: 15   Highest education level: Bachelor's degree (e.g., BA, AB, BS)  Occupational History   Occupation: Retired    Comment: marketing  Tobacco Use   Smoking status: Never   Smokeless tobacco: Never  Vaping Use   Vaping Use: Never used  Substance and Sexual Activity   Alcohol use: Not Currently    Alcohol/week: 0.0 standard drinks   Drug use: No   Sexual activity: Yes    Partners: Male    Birth control/protection: Post-menopausal  Other Topics Concern   Not on file  Social History Narrative   Lives at home with husband.    They have 3 children who live away - [place], [place], and [place].   She is from [place] and most of her family lives there.   Her husbands's family live nearby   Right handed.   Social Determinants of Health   Financial Resource Strain: Low Risk    Difficulty of Paying Living Expenses: Not hard at all  Food Insecurity: No Food Insecurity   Worried About Charity fundraiser in the Last Year: Never true   [place]  in the Last Year: Never true  Transportation Needs: No Transportation Needs   Lack of Transportation (Medical): No   Lack of Transportation (Non-Medical): No  Physical Activity: Inactive   Days of Exercise per Week: 0 days   Minutes of Exercise per Session: 0 min  Stress: Stress Concern Present   Feeling of Stress : To some extent  Social Connections: Moderately Integrated   Frequency of Communication with Friends and Family: More than three times a week   Frequency of Social Gatherings with Friends and Family: Once a week   Attends Religious Services: More than 4 times per year   Active Member of [employer] or Organizations: No   Attends Archivist Meetings: Never   Marital Status: Married  Human resources officer Violence: Not At Risk   Fear of Current or Ex-Partner: No   Emotionally Abused: No   Physically Abused: No   Sexually Abused: No    Outpatient Medications Prior to Visit  Medication Sig Dispense Refill   acetaminophen (TYLENOL) 325 MG tablet Take 2 tablets (650 mg total) by mouth every 6 (six) hours as needed for mild pain, moderate pain or fever. 12 tablet 0   albuterol (PROVENTIL) (2.5 MG/3ML) 0.083% nebulizer solution Take 3 mLs (2.5 mg total) by nebulization every 6 (six) hours as needed for wheezing or shortness of breath. 150 mL 0   albuterol (VENTOLIN HFA) 108 (90 Base) MCG/ACT inhaler Inhale 2 puffs into the lungs every 6 (six) hours as needed for wheezing or shortness of breath. 1 each 0   alendronate (FOSAMAX) 70 MG tablet Take 1 tablet (70 mg total) by mouth every 7 (seven) days. Take with a full glass of water on an empty stomach. Do not lay down for at least 2 hours 4 tablet 11   apixaban (ELIQUIS) 5 MG TABS tablet Take 1 tablet (5 mg total) by mouth 2 (two) times daily. 60 tablet 0   ARIPiprazole (ABILIFY) 10 MG tablet Take 3 tablets (30 mg total) by mouth daily. 90 tablet 0   atorvastatin (LIPITOR) 40 MG tablet Take 1 tablet (40 mg total) by mouth daily. 90  tablet 3   carbamazepine (CARBATROL) 100 MG 12 hr capsule Take by mouth at bedtime.     Continuous Blood Gluc Sensor (DEXCOM G6 SENSOR) MISC 1 Device by Does not apply route as directed. 9 each 3   Continuous Blood Gluc Transmit (DEXCOM G6 TRANSMITTER) MISC 1 Device by Does not apply route as directed. 1 each 3   denosumab (PROLIA) 60 MG/ML SOSY injection Inject 60 mg into the skin every 6 (six) months. 1 mL 1  diltiazem (CARDIZEM CD) 120 MG 24 hr capsule Take 1 capsule (120 mg total) by mouth at bedtime. 90 capsule 2   empagliflozin (JARDIANCE) 10 MG TABS tablet Take 1 tablet (10 mg total) by mouth daily before breakfast. 90 tablet 1   escitalopram (LEXAPRO) 10 MG tablet Take 1 tablet (10 mg total) by mouth daily. 30 tablet 0   flurazepam (DALMANE) 30 MG capsule Take 30 mg by mouth at bedtime as needed.     Fluticasone-Salmeterol (ADVAIR DISKUS) 500-50 MCG/DOSE AEPB Inhale 1 puff into the lungs in the morning and at bedtime. 60 each 0   furosemide (LASIX) 40 MG tablet Take 1 tablet (40 mg total) by mouth every morning. TAKE 1 TABLET EVERY MORNING 30 tablet 3   Galcanezumab-gnlm (EMGALITY) 120 MG/ML SOAJ Administer once a month starting 09/27/2020 1.12 mL 11   HYDROmorphone (DILAUDID) 2 MG tablet Take 1 tablet (2 mg total) by mouth every 6 (six) hours as needed for severe pain. 120 tablet 0   icosapent Ethyl (VASCEPA) 1 g capsule TAKE 2 CAPSULES TWICE A DAY WITH MEALS 120 capsule 0   insulin glargine (LANTUS SOLOSTAR) 100 UNIT/ML Solostar Pen Inject 45 Units into the skin 2 (two) times daily. 30 mL 2   insulin lispro (HUMALOG KWIKPEN) 100 UNIT/ML KwikPen 10 units AC, TID 15 mL 3   LORazepam (ATIVAN) 1 MG tablet Take 1 tablet (1 mg total) by mouth 2 (two) times daily. And give 2 tablets by mouth at bedtime 90 tablet 0   meclizine (ANTIVERT) 12.5 MG tablet Take 1 tablet (12.5 mg total) by mouth 3 (three) times daily as needed for dizziness. 30 tablet 0   metFORMIN (GLUCOPHAGE) 500 MG tablet TAKE 1  TABLET(500 MG) BY MOUTH TWICE DAILY AFTER A MEAL 180 tablet 3   metoprolol succinate (TOPROL XL) 25 MG 24 hr tablet Take 1 tablet (25 mg total) by mouth in the morning and at bedtime. 180 tablet 3   metoprolol succinate (TOPROL-XL) 100 MG 24 hr tablet Take 1 tablet (100 mg total) by mouth in the morning and at bedtime. 180 tablet 3   metroNIDAZOLE (METROGEL) 0.75 % gel Apply topically.     NON FORMULARY Diet: _____ Regular, ___  __ NAS, ___x____Consistent Carbohydrate, _______NPO _____Other     polyethylene glycol (MIRALAX / GLYCOLAX) 17 g packet Take 17 g by mouth daily. 14 each 0   potassium chloride SA (KLOR-CON) 20 MEQ tablet Take 1 tablet (20 mEq total) by mouth daily. 30 tablet 0   pregabalin (LYRICA) 200 MG capsule Take 1 capsule (200 mg total) by mouth 2 (two) times daily. 60 capsule 5   Semaglutide (RYBELSUS) 14 MG TABS Take 14 mg by mouth daily. 90 tablet 1   tiZANidine (ZANAFLEX) 4 MG capsule TAKE 1 CAPSULE BY MOUTH THREE TIMES DAILY AS NEEDED FOR MUSCLE SPASMS 270 capsule 0   traZODone (DESYREL) 100 MG tablet TAKE 4 TABLETS AT BEDTIME 360 tablet 3   triamcinolone cream (KENALOG) 0.1 % SMARTSIG:1 Application Topical 2-3 Times Daily     Vitamin D, Cholecalciferol, 25 MCG (1000 UT) TABS Take 1 tablet by mouth daily in the afternoon.     fluconazole (DIFLUCAN) 150 MG tablet TAKE 1 TABLET(150 MG) BY MOUTH 1 TIME FOR 1 DOSE. MAY REPEAT AFTER 3 DAYS AS NEEDED 2 tablet 0   Facility-Administered Medications Prior to Visit  Medication Dose Route Frequency Provider Last Rate Last Admin   bupivacaine-epinephrine (MARCAINE W/ EPI) 0.5% -1:200000 injection    Anesthesia   Intra-op Hollis, Kevin D, MD   12 mL at 01/21/19 0935    Allergies  Allergen Reactions   Ivp Dye [Iodinated Diagnostic Agents] Anaphylaxis and Swelling    Throat closes    Codeine Other (See Comments)   Iodine Other (See Comments)   Latex     Latex tape pulls skin with it    Review of Systems  Constitutional:  Negative.   HENT: Negative.    Eyes: Negative.   Respiratory: Negative.    Gastrointestinal:  Negative for abdominal pain, nausea and vomiting.  Genitourinary:  Positive for dysuria, flank pain and vaginal discharge. Negative for pelvic pain.  Skin: Negative.  Negative for rash.  All other systems reviewed and are negative.     Objective:    Physical Exam Vitals and nursing note reviewed.  Constitutional:      Appearance: Normal appearance. She is obese.  HENT:     Head: Normocephalic.     Right Ear: Ear canal and external ear normal.     Left Ear: Ear canal and external ear normal.     Nose: Nose normal.     Mouth/Throat:     Mouth: Mucous membranes are moist.     Pharynx: Oropharynx is clear.  Eyes:     Conjunctiva/sclera: Conjunctivae normal.  Cardiovascular:     Rate and Rhythm: Normal rate and regular rhythm.     Pulses: Normal pulses.     Heart sounds: Normal heart sounds.  Pulmonary:     Effort: Pulmonary effort is normal.     Breath sounds: Normal breath sounds.  Abdominal:     General: Bowel sounds are normal.     Tenderness: There is right CVA tenderness and left CVA tenderness.  Skin:    General: Skin is warm.     Findings: No rash.  Neurological:     Mental Status: She is alert and oriented to person, place, and time.    BP 124/72   Pulse 80   Temp (!) 97.1 F (36.2 C) (Temporal)   Resp 20   Ht 5' 2" (1.575 m)   Wt 192 lb (87.1 kg)   SpO2 97%   BMI 35.12 kg/m  Wt Readings from Last 3 Encounters:  04/16/21 192 lb (87.1 kg)  04/15/21 195 lb 3.2 oz (88.5 kg)  04/03/21 191 lb (86.6 kg)    Health Maintenance Due  Topic Date Due   Zoster Vaccines- Shingrix (1 of 2) Never done   OPHTHALMOLOGY EXAM  11/10/2019   FOOT EXAM  05/19/2020   COVID-19 Vaccine (4 - Booster for Moderna series) 06/27/2020   URINE MICROALBUMIN  08/28/2020    There are no preventive care reminders to display for this patient.   Lab Results  Component Value Date   TSH  2.340 08/29/2019   Lab Results  Component Value Date   WBC 12.6 (H) 04/03/2021   HGB 16.2 (H) 04/03/2021   HCT 49.4 (H) 04/03/2021   MCV 87 04/03/2021   PLT 222 04/03/2021   Lab Results  Component Value Date   NA 137 04/03/2021   K 3.8 04/03/2021   CO2 24 04/03/2021   GLUCOSE 273 (H) 04/03/2021   BUN 14 04/03/2021   CREATININE 1.35 (H) 04/03/2021   BILITOT 0.4 04/03/2021   ALKPHOS 141 (H) 04/03/2021   AST 19 04/03/2021   ALT 11 04/03/2021   PROT 7.3 04/03/2021   ALBUMIN 4.5 04/03/2021   CALCIUM 9.5 04/03/2021   ANIONGAP 11 12/16/2020     EGFR 41 (L) 04/03/2021   GFR 59.25 (L) 08/07/2015   Lab Results  Component Value Date   CHOL 161 04/03/2021   Lab Results  Component Value Date   HDL 41 04/03/2021   Lab Results  Component Value Date   LDLCALC 70 04/03/2021   Lab Results  Component Value Date   TRIG 311 (H) 04/03/2021   Lab Results  Component Value Date   CHOLHDL 3.9 04/03/2021   Lab Results  Component Value Date   HGBA1C 6.7 (H) 04/03/2021       Assessment & Plan:   Problem List Items Addressed This Visit       Musculoskeletal and Integument   Vaginal itching    Vaginal itching not well controlled, fluconazole 150 mg tablet by mouth once.  Education provided to patient printed handouts given.  Rx sent to pharmacy, follow-up with unresolved symptoms.  Completed wet prep results pending.       Relevant Medications   fluconazole (DIFLUCAN) 150 MG tablet     Other   Dysuria - Primary    Dysuria symptoms resolved in the last 24 to 36 hours.  Completed urinalysis results pending.  No fever, nausea, or CVA tenderness associated with symptoms..  Patient very nervous and unsteady on her feet while in clinic today.  Patient reports that she has not been using her antianxiety medication [Ativan] I provided education to patient to please take all medication as prescribed and follow-up with unresolved symptoms please, patient verbalized understanding and  will take a dose of Ativan when she is home.  Reiterate the need to make sure that oxygen saturation is above 93%, lungs are clear with no other signs or symptoms of distress.      Relevant Orders   Urinalysis, Complete (Completed)   Urine Culture     Meds ordered this encounter  Medications   fluconazole (DIFLUCAN) 150 MG tablet    Sig: Take 1 tablet (150 mg total) by mouth once for 1 dose.    Dispense:  1 tablet    Refill:  0    Order Specific Question:   Supervising Provider    Answer:   STACKS, WARREN [[id number]]      M , NP  

## 2021-04-16 NOTE — Assessment & Plan Note (Signed)
Dysuria symptoms resolved in the last 24 to 36 hours.  Completed urinalysis results pending.  No fever, nausea, or CVA tenderness associated with symptoms..  Patient very nervous and unsteady on her feet while in clinic today.  Patient reports that she has not been using her antianxiety medication [Ativan] I provided education to patient to please take all medication as prescribed and follow-up with unresolved symptoms please, patient verbalized understanding and will take a dose of Ativan when she is home.  Reiterate the need to make sure that oxygen saturation is above 93%, lungs are clear with no other signs or symptoms of distress.

## 2021-04-16 NOTE — Telephone Encounter (Signed)
Per Dr. Florentina Jenny request, called to check on patient after visit yesterday. Patient stated that her chest is not tight anymore and she feels better. She stated that she is going to her PCP today.

## 2021-04-17 DIAGNOSIS — F251 Schizoaffective disorder, depressive type: Secondary | ICD-10-CM | POA: Diagnosis not present

## 2021-04-19 ENCOUNTER — Telehealth: Payer: Self-pay | Admitting: Neurology

## 2021-04-19 NOTE — Telephone Encounter (Signed)
Pt called in stating her pharmacist told her she needs a prior authorization for her Emgality

## 2021-04-20 LAB — URINE CULTURE

## 2021-04-22 ENCOUNTER — Telehealth: Payer: Self-pay

## 2021-04-22 NOTE — Telephone Encounter (Signed)
Amber Stephenson (Key: [id number]) Emgality 120MG /ML auto-injectors (migraine)   Form [employer] Electronic PA Form (2017 NCP[place]) Created 15 minutes ago Sent to Plan 13 minutes ago Plan Response 12 minutes ago Submit Clinical Questions 2 minutes ago Determination Wait for Determination Please wait for [facility] [employer] 2017 to return a determination.

## 2021-04-22 NOTE — Telephone Encounter (Signed)
New message -website CoverMyMeds  [facility] is reviewing your PA request and will respond within 24 hours for Medicaid or up to 72 hours for non-Medicaid plans, based on the required timeframe determined by state or federal regulations. To check for an update later, open this request from your dashboard.    Amber Stephenson (Key: [id number]) Emgality 120MG /ML auto-injectors (migraine)   Form [employer] Electronic PA Form (2017 NCPDP) Created 15 minutes ago Sent to Plan 13 minutes ago Plan Response 12 minutes ago Submit Clinical Questions 2 minutes ago Determination Wait for Determination Please wait for [facility] [employer] 2017 to return a determination.

## 2021-04-28 ENCOUNTER — Other Ambulatory Visit: Payer: Self-pay

## 2021-04-28 ENCOUNTER — Ambulatory Visit
Admission: RE | Admit: 2021-04-28 | Discharge: 2021-04-28 | Disposition: A | Discharge status: Home / Self Care | Payer: Medicare Other | Source: Ambulatory Visit | Attending: Neurosurgery | Admitting: Neurosurgery

## 2021-04-28 DIAGNOSIS — R270 Ataxia, unspecified: Secondary | ICD-10-CM | POA: Diagnosis not present

## 2021-04-28 DIAGNOSIS — R278 Other lack of coordination: Secondary | ICD-10-CM

## 2021-04-28 DIAGNOSIS — M4802 Spinal stenosis, cervical region: Secondary | ICD-10-CM | POA: Diagnosis not present

## 2021-04-28 DIAGNOSIS — M545 Low back pain, unspecified: Secondary | ICD-10-CM | POA: Diagnosis not present

## 2021-04-28 DIAGNOSIS — L720 Epidermal cyst: Secondary | ICD-10-CM

## 2021-04-29 ENCOUNTER — Ambulatory Visit (INDEPENDENT_AMBULATORY_CARE_PROVIDER_SITE_OTHER): Payer: Medicare Other | Admitting: Internal Medicine

## 2021-04-29 VITALS — BP 116/78 | HR 90 | Ht 62.0 in | Wt 198.0 lb

## 2021-04-29 DIAGNOSIS — Z794 Long term (current) use of insulin: Secondary | ICD-10-CM | POA: Diagnosis not present

## 2021-04-29 DIAGNOSIS — E0865 Diabetes mellitus due to underlying condition with hyperglycemia: Secondary | ICD-10-CM

## 2021-04-29 DIAGNOSIS — E119 Type 2 diabetes mellitus without complications: Secondary | ICD-10-CM | POA: Diagnosis not present

## 2021-04-29 MED ORDER — DEXCOM G6 SENSOR MISC: 1.0000 | 3 refills | Status: DC

## 2021-04-29 MED ORDER — EMPAGLIFLOZIN 25 MG PO TABS: 25.0000 mg | ORAL_TABLET | Freq: Every day | ORAL | 2 refills | Status: DC

## 2021-04-29 MED ORDER — DEXCOM G6 TRANSMITTER MISC
1.0000 | 3 refills | Status: DC
Start: 2021-04-29 — End: 2022-05-12

## 2021-04-29 NOTE — Progress Notes (Signed)
Name: Amber Stephenson  Age/ Sex: 75 y.o., female   MRN/ DOB: [id number], 04/24/1946     PCP: [name], MD   Reason for Endocrinology Evaluation: Type 2 Diabetes Mellitus  Initial Endocrine Consultative Visit: 09/24/2020    PATIENT IDENTIFIER: Amber Stephenson is a 75 y.o. female with a past medical history of T2DM, HTN, Dyslipidemia, OSA, CHF and A.Fib. The patient has followed with Endocrinology clinic since 09/24/2020 for consultative assistance with management of her diabetes.  DIABETIC HISTORY:  Amber Stephenson was diagnosed with DM in 2007, she has been on basal insulin for years, prandial insulin started 2016. GLP-1 agonists started in 2017. Metformin restarted 2017. Her hemoglobin A1c has ranged from 6.7% in 08/2019, peaking at 10.9% in 04/2020.  Saw Dr. Dorris Fetch in 2016 and Dr. Dwyane Dee in 2017   On her initial visit to our clinic she had an A1c 9.1% . We increased Metformin, Rybelsus and adjusted MDI regimen.   SUBJECTIVE:   During the last visit (12/24/2020): A1c6.8% We increased Metformin, Rybelsus and adjusted MDI regimen.    Today (04/29/2021): Ms. Lymon is here for a follow up on diabetes management.  She checks her blood sugars multiple  times daily through the CGM. Marland Kitchen The patient has not had hypoglycemic episodes since the last clinic visit    She had an admission for CHF 11/2020 Denies side effects to Jardiance  Has noted 5th toe pain last night, denies injury   HOME DIABETES REGIMEN:  Metformin 500 mg 2 tabs BID Jardiance 10 mg daily  Rybelsus 14 mg daily Lantus 42 units BID Correction factor : Humalog ( BG -120/20)      Statin: yes ACE-I/ARB: no Prior Diabetic Education: no    CONTINUOUS GLUCOSE MONITORING RECORD INTERPRETATION    Dates of Recording: 11/15-11/28/2022  Sensor description: dexcom   Results statistics:   CGM use % of time 100  Average and SD 212/68  Time in range   33   %  % Time Above 180 39  % Time above 250 27  % Time  Below target <1     Glycemic patterns summary: Hyperglycemia noted during the day , optimal at night   Hyperglycemic episodes  postprandial   Hypoglycemic episodes occurred at night   Overnight periods: trends down      DIABETIC COMPLICATIONS: Microvascular complications:   Denies: CKD,  retinopathy , neuropathy ( she has this in the charts but denies neuropathy) Last eye exam: Completed 07/2020   Macrovascular complications:   Denies: CAD, PVD, CVA    HISTORY:  Past Medical History:  Past Medical History:  Diagnosis Date   Acute on chronic respiratory failure with hypoxia ([place]) 01/22/2019   Asthma    Atrial fibrillation with RVR 05/19/2017   Bipolar I disorder 01/23/2015   CAD (coronary artery disease)    Nonobstructive; Managed by Dr. 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 Wallace Keller   RLS (restless legs syndrome) 04/27/2015   Past Surgical History:  Past Surgical History:  Procedure Laterality Date   BREAST REDUCTION SURGERY     EYE SURGERY Right    cateracts   HAMMER TOE SURGERY     LEFT HEART CATH AND CORONARY ANGIOGRAPHY N/A 02/03/2018   Procedure: LEFT HEART CATH AND CORONARY ANGIOGRAPHY;  Surgeon: Martinique, Peter M, MD;   Location: [facility];  Service: Cardiovascular;  Laterality: N/A;   REVERSE SHOULDER ARTHROPLASTY Right 08/19/2019   Procedure: REVERSE SHOULDER ARTHROPLASTY;  Surgeon: Netta Cedars, MD;  Location: [facility];  Service: Orthopedics;  Laterality: Right;  interscalene block   SHOULDER SURGERY Right    "I BROKE MY SHOUDLER   THIGH SURGERY     "TO REMOVE A TUMOR "   Social History:  reports that she has never smoked. She has never used smokeless tobacco. She reports that she does not currently use alcohol. She reports that she does not use drugs. Family History:  Family History  Problem Relation Age of Onset   Diabetes Mother    Heart disease Mother    Heart disease Brother    Heart disease Sister        CABG   Diabetes Sister    Alzheimer's disease Father    Alcohol abuse Sister    Mental illness Brother    Diabetes Brother      HOME MEDICATIONS: Allergies as of 04/29/2021       Reactions   Ivp Dye [iodinated Diagnostic Agents] Anaphylaxis, Swelling   Throat closes   Codeine Other (See Comments)   Iodine Other (See Comments)   Latex    Latex tape pulls skin with it        Medication List        Accurate as of April 29, 2021  2:10 PM. If you have any questions, ask your nurse or doctor.          acetaminophen 325 MG tablet Commonly known as: TYLENOL Take 2 tablets (650 mg total) by mouth every 6 (six) hours as needed for mild pain, moderate pain or fever.   albuterol (2.5 MG/3ML) 0.083% nebulizer solution Commonly known as: PROVENTIL Take 3 mLs (2.5 mg total) by nebulization every 6 (six) hours as needed for wheezing or shortness of breath.   albuterol 108 (90 Base) MCG/ACT inhaler Commonly known as: VENTOLIN HFA Inhale 2 puffs into the lungs every 6 (six) hours as needed for wheezing or shortness of breath.   alendronate 70 MG tablet Commonly known as: FOSAMAX Take 1 tablet (70 mg total) by mouth every 7 (seven) days. Take with a full glass of water  on an empty stomach. Do not lay down for at least 2 hours   apixaban 5 MG Tabs tablet Commonly known as: Eliquis Take 1 tablet (5 mg total) by mouth 2 (two) times daily.   ARIPiprazole 10 MG tablet Commonly known as: ABILIFY Take 3 tablets (30 mg total) by mouth daily.   atorvastatin 40 MG tablet Commonly known as: LIPITOR Take 1 tablet (40 mg total) by mouth daily.   carbamazepine 100 MG 12 hr capsule Commonly known as: CARBATROL Take by mouth at bedtime.   Dexcom G6 Sensor Misc 1 Device by Does not  apply route as directed.   Dexcom G6 Transmitter Misc 1 Device by Does not apply route as directed.   diltiazem 120 MG 24 hr capsule Commonly known as: CARDIZEM CD Take 1 capsule (120 mg total) by mouth at bedtime.   Emgality 120 MG/ML Soaj Generic drug: Galcanezumab-gnlm Administer once a month starting 09/27/2020   empagliflozin 10 MG Tabs tablet Commonly known as: Jardiance Take 1 tablet (10 mg total) by mouth daily before breakfast.   escitalopram 10 MG tablet Commonly known as: Lexapro Take 1 tablet (10 mg total) by mouth daily.   flurazepam 30 MG capsule Commonly known as: DALMANE Take 30 mg by mouth at bedtime as needed.   Fluticasone-Salmeterol 500-50 MCG/DOSE Aepb Commonly known as: Advair Diskus Inhale 1 puff into the lungs in the morning and at bedtime.   furosemide 40 MG tablet Commonly known as: LASIX Take 1 tablet (40 mg total) by mouth every morning. TAKE 1 TABLET EVERY MORNING   HYDROmorphone 2 MG tablet Commonly known as: Dilaudid Take 1 tablet (2 mg total) by mouth every 6 (six) hours as needed for severe pain.   icosapent Ethyl 1 g capsule Commonly known as: Vascepa TAKE 2 CAPSULES TWICE A DAY WITH MEALS   insulin lispro 100 UNIT/ML KwikPen Commonly known as: HumaLOG KwikPen 10 units AC, TID   Lantus SoloStar 100 UNIT/ML Solostar Pen Generic drug: insulin glargine Inject 45 Units into the skin 2 (two) times daily.   LORazepam 1 MG  tablet Commonly known as: ATIVAN Take 1 tablet (1 mg total) by mouth 2 (two) times daily. And give 2 tablets by mouth at bedtime   meclizine 12.5 MG tablet Commonly known as: ANTIVERT Take 1 tablet (12.5 mg total) by mouth 3 (three) times daily as needed for dizziness.   metFORMIN 500 MG tablet Commonly known as: GLUCOPHAGE TAKE 1 TABLET(500 MG) BY MOUTH TWICE DAILY AFTER A MEAL   metoprolol succinate 25 MG 24 hr tablet Commonly known as: Toprol XL Take 1 tablet (25 mg total) by mouth in the morning and at bedtime.   metoprolol succinate 100 MG 24 hr tablet Commonly known as: TOPROL-XL Take 1 tablet (100 mg total) by mouth in the morning and at bedtime.   metroNIDAZOLE 0.75 % gel Commonly known as: METROGEL Apply topically.   NON FORMULARY Diet: _____ Regular, ___  __ NAS, ___x____Consistent Carbohydrate, _______NPO _____Other   polyethylene glycol 17 g packet Commonly known as: MIRALAX / GLYCOLAX Take 17 g by mouth daily.   potassium chloride SA 20 MEQ tablet Commonly known as: KLOR-CON Take 1 tablet (20 mEq total) by mouth daily.   pregabalin 200 MG capsule Commonly known as: Lyrica Take 1 capsule (200 mg total) by mouth 2 (two) times daily.   Prolia 60 MG/ML Sosy injection Generic drug: denosumab Inject 60 mg into the skin every 6 (six) months.   Rybelsus 14 MG Tabs Generic drug: Semaglutide Take 14 mg by mouth daily.   tiZANidine 4 MG capsule Commonly known as: ZANAFLEX TAKE 1 CAPSULE BY MOUTH THREE TIMES DAILY AS NEEDED FOR MUSCLE SPASMS   traZODone 100 MG tablet Commonly known as: DESYREL TAKE 4 TABLETS AT BEDTIME   triamcinolone cream 0.1 % Commonly known as: KENALOG SMARTSIG:1 Application Topical 2-3 Times Daily   Vitamin D (Cholecalciferol) 25 MCG (1000 UT) Tabs Take 1 tablet by mouth daily in the afternoon.         OBJECTIVE:   Vital Signs: BP 116/78 (BP Location: Left Arm, Patient Position:  Sitting, Cuff Size: Normal)   Pulse 90    Ht 5\' 2"  (1.575 m)   Wt 198 lb (89.8 kg)   SpO2 92%   BMI 36.21 kg/m   Wt Readings from Last 3 Encounters:  04/29/21 198 lb (89.8 kg)  04/16/21 192 lb (87.1 kg)  04/15/21 195 lb 3.2 oz (88.5 kg)     Exam: General: Pt appears well and is in NAD  Lungs: Clear with good BS bilat with no rales, rhonchi, or wheezes  Heart: RRR with normal S1 and S2 and no gallops; no murmurs; no rub  Extremities: No pretibial edema.  Left foot exam intact with tenderness over the 5th toe but no swelling   Neuro: MS is good with appropriate affect, pt is alert and Ox3    DM foot exam: 09/24/2020   The skin of the feet is without sores or ulcerations, left 2nd toe deformity  The pedal pulses are 2+ on right and 2+ on left. The sensation is intact to a screening 5.07, 10 gram monofilament bilaterally   DATA REVIEWED:  Lab Results  Component Value Date   HGBA1C 6.7 (H) 04/03/2021   HGBA1C 6.8 (A) 12/24/2020   HGBA1C 6.8 (H) 12/13/2020   Lab Results  Component Value Date   MICROALBUR 1.9 07/23/2015   LDLCALC 70 04/03/2021   CREATININE 1.35 (H) 04/03/2021   Lab Results  Component Value Date   MICRALBCREAT <12 08/29/2019     Lab Results  Component Value Date   CHOL 161 04/03/2021   HDL 41 04/03/2021   LDLCALC 70 04/03/2021   LDLDIRECT 62 03/17/2016   TRIG 311 (H) 04/03/2021   CHOLHDL 3.9 04/03/2021         ASSESSMENT / PLAN / RECOMMENDATIONS:   1) Type 2 Diabetes Mellitus, Optimally controlled, Without complications - Most recent A1c of 6.7 %. Goal A1c < 7.0 %.    - Despite optimal A1c earlier in the month, her CGM download shows glucose average 212 mg/dL over the past 2 weeks, she admits to eating sweets  - She has not been using correction scale, she is unable to comprehend it, and we opted to  put her on standing dose of insulin    MEDICATIONS: - Continue Metformin 500 mg, 1 tablets twice daily  - Continue  Rybelsus  14 mg, 1 tablet in the morning  - Decrease Lantus to 38  units Twice a day  - Increase  Jardiance 25 mg, 1 tablet every morning  - Start Humalog 10 units TIDQAC   EDUCATION / INSTRUCTIONS: BG monitoring instructions: Patient is instructed to check her blood sugars 3 times a day, before meals . Call [facility] if: BG persistently < 70 or > 300. I reviewed the Rule of 15 for the treatment of hypoglycemia in detail with the patient. Literature supplied.   2) Diabetic complications:  Eye: Does not have known diabetic retinopathy.  Neuro/ Feet: Does not have known diabetic peripheral neuropathy .  Renal: Patient does not have known baseline CKD. She   is not on an ACEI/ARB at present.   F/U in 4 months     Signed electronically by: Mack Guise, MD  [facility]  [facility] [address] [place], [place] [address] Phone: [phone] FAX: [phone]   CC: [name], [place] [address] Phone: [phone]  Fax: [phone]  Return to Endocrinology clinic as below: Future Appointments  Date Time Provider  [place]  06/14/2021 10:00 AM Cameron Sprang, MD [facility] None  07/04/2021  3:10 PM [name], MD [facility] None  07/19/2021  2:40 PM Courtney Heys, MD [facility] CPR  12/13/2021  1:15 PM [facility]-ANNUAL WELLNESS VISIT [facility] None

## 2021-04-29 NOTE — Patient Instructions (Addendum)
-   Continue Metformin 500 mg, 1 tablets twice daily  - Continue  Rybelsus  14 mg, 1 tablet in the morning  - Decrease Lantus to 38 units Twice a day  - Increase Jardiance 25 mg, 1 tablet every morning  - Humalog 10 units with each meal     HOW TO TREAT LOW BLOOD SUGARS (Blood sugar LESS THAN 70 MG/DL) Please follow the RULE OF 15 for the treatment of hypoglycemia treatment (when your (blood sugars are less than 70 mg/dL)   STEP 1: Take 15 grams of carbohydrates when your blood sugar is low, which includes:  3-4 GLUCOSE TABS  OR 3-4 OZ OF JUICE OR REGULAR SODA OR ONE TUBE OF GLUCOSE GEL    STEP 2: RECHECK blood sugar in 15 MINUTES STEP 3: If your blood sugar is still low at the 15 minute recheck --> then, go back to STEP 1 and treat AGAIN with another 15 grams of carbohydrates.

## 2021-04-30 NOTE — Telephone Encounter (Signed)
Prolia ordered

## 2021-04-30 NOTE — Telephone Encounter (Signed)
Patient wants Prolia injection.  Please call and schedule

## 2021-05-01 ENCOUNTER — Telehealth: Payer: Self-pay | Admitting: Student

## 2021-05-01 NOTE — Telephone Encounter (Signed)
   Patient Name: Amber Stephenson  DOB: 1945/08/04 MRN: [id number]  Primary Cardiologist: None  Chart reviewed as part of pre-operative protocol coverage.   Simple dental extractions (1-2 teeth) are considered low risk procedures per guidelines and generally do not require any specific cardiac clearance. It is also generally accepted that for simple extractions and dental cleanings, there is no need to interrupt blood thinner therapy.  SBE prophylaxis is not required for the patient from a cardiac standpoint.  I will route this recommendation to the requesting party via Epic fax function and remove from pre-op pool.  Please call with questions.  Darreld Mclean, PA-C 05/01/2021, 1:24 PM

## 2021-05-01 NOTE — Telephone Encounter (Signed)
   [facility] Pre-operative Risk Assessment    Patient Name: Amber Stephenson  DOB: 02/17/1946 MRN: [id number]  HEARTCARE STAFF:  - IMPORTANT!!!!!! Under Visit Info/Reason for Call, type in Other and utilize the format Clearance MM/DD/YY or Clearance TBD. Do not use dashes or single digits. - Please review there is not already an duplicate clearance open for this procedure. - If request is for dental extraction, please clarify the # of teeth to be extracted. - If the patient is currently at the dentist's office, call Pre-Op Callback Staff (MA/nurse) to input urgent request.  - If the patient is not currently in the dentist office, please route to the Pre-Op pool.  Request for surgical clearance:  What type of surgery is being performed? Removal of teeth 3, 15, C  When is this surgery scheduled? TBD  What type of clearance is required (medical clearance vs. Pharmacy clearance to hold med vs. Both)? both  Are there any medications that need to be held prior to surgery and how long? Dr. Is asking if Eliquis needs to be stopped and, for how long   Practice name and name of physician performing surgery? [facility] /  Amber Stephenson  What is the office phone number? [phone]   7.   What is the office fax number? [phone]  8.   Anesthesia type (None, local, MAC, general) ? Intravenous anesthesia    Amber Stephenson 05/01/2021, 12:45 PM  _________________________________________________________________   (provider comments below)

## 2021-05-03 ENCOUNTER — Ambulatory Visit (INDEPENDENT_AMBULATORY_CARE_PROVIDER_SITE_OTHER): Payer: Medicare Other | Admitting: Family Medicine

## 2021-05-03 ENCOUNTER — Ambulatory Visit: Payer: Medicare Other | Admitting: Family Medicine

## 2021-05-03 ENCOUNTER — Encounter: Payer: Self-pay | Admitting: Family Medicine

## 2021-05-03 ENCOUNTER — Other Ambulatory Visit: Payer: Self-pay | Admitting: Nurse Practitioner

## 2021-05-03 VITALS — BP 121/83 | HR 56 | Temp 97.4°F | Ht 62.0 in | Wt 198.5 lb

## 2021-05-03 DIAGNOSIS — M810 Age-related osteoporosis without current pathological fracture: Secondary | ICD-10-CM

## 2021-05-03 DIAGNOSIS — N898 Other specified noninflammatory disorders of vagina: Secondary | ICD-10-CM

## 2021-05-03 DIAGNOSIS — I10 Essential (primary) hypertension: Secondary | ICD-10-CM | POA: Diagnosis not present

## 2021-05-03 DIAGNOSIS — N760 Acute vaginitis: Secondary | ICD-10-CM | POA: Diagnosis not present

## 2021-05-03 MED ORDER — FLUCONAZOLE 150 MG PO TABS: ORAL_TABLET | ORAL | 0 refills | Status: DC

## 2021-05-03 MED ORDER — DENOSUMAB 60 MG/ML ~~LOC~~ SOSY
60.0000 mg | PREFILLED_SYRINGE | Freq: Once | SUBCUTANEOUS | Status: AC
  Administered 2021-11-19: 60 mg via SUBCUTANEOUS

## 2021-05-03 NOTE — Telephone Encounter (Signed)
Prolia given by nurse 05/03/21

## 2021-05-03 NOTE — Patient Instructions (Signed)
Vaginal Yeast Infection, Adult Vaginal yeast infection is a condition that causes vaginal discharge as well as soreness, swelling, and redness (inflammation) of the vagina. This is a common condition. Some women get this infection frequently. What are the causes? This condition is caused by a change in the normal balance of the yeast (Candida) and normal bacteria that live in the vagina. This change causes an overgrowth of yeast, which causes the inflammation. What increases the risk? The condition is more likely to develop in women who: Take antibiotic medicines. Have diabetes. Take birth control pills. Are pregnant. Douche often. Have a weak body defense system (immune system). Have been taking steroid medicines for a long time. Frequently wear tight clothing. What are the signs or symptoms? Symptoms of this condition include: White, thick, creamy vaginal discharge. Swelling, itching, redness, and irritation of the vagina. The lips of the vagina (labia) may be affected as well. Pain or a burning feeling while urinating. Pain during sex. How is this diagnosed? This condition is diagnosed based on: Your medical history. A physical exam. A pelvic exam. Your health care provider will examine a sample of your vaginal discharge under a microscope. Your health care provider may send this sample for testing to confirm the diagnosis. How is this treated? This condition is treated with medicine. Medicines may be over-the-counter or prescription. You may be told to use one or more of the following: Medicine that is taken by mouth (orally). Medicine that is applied as a cream (topically). Medicine that is inserted directly into the vagina (suppository). Follow these instructions at home: Take or apply over-the-counter and prescription medicines only as told by your health care provider. Do not use tampons until your health care provider approves. Do not have sex until your infection has  cleared. Sex can prolong or worsen your symptoms of infection. Ask your health care provider when it is safe to resume sexual activity. Keep all follow-up visits. This is important. How is this prevented?  Do not wear tight clothes, such as pantyhose or tight pants. Wear breathable cotton underwear. Do not use douches, perfumed soap, creams, or powders. Wipe from front to back after using the toilet. If you have diabetes, keep your blood sugar levels under control. Ask your health care provider for other ways to prevent yeast infections. Contact a health care provider if: You have a fever. Your symptoms go away and then return. Your symptoms do not get better with treatment. Your symptoms get worse. You have new symptoms. You develop blisters in or around your vagina. You have blood coming from your vagina and it is not your menstrual period. You develop pain in your abdomen. Summary Vaginal yeast infection is a condition that causes discharge as well as soreness, swelling, and redness (inflammation) of the vagina. This condition is treated with medicine. Medicines may be over-the-counter or prescription. Take or apply over-the-counter and prescription medicines only as told by your health care provider. Do not douche. Resume sexual activity or use of tampons as instructed by your health care provider. Contact a health care provider if your symptoms do not get better with treatment or your symptoms go away and then return. This information is not intended to replace advice given to you by your health care provider. Make sure you discuss any questions you have with your health care provider. Document Revised: 08/06/2020 Document Reviewed: 08/06/2020 Elsevier Patient Education  [employer].

## 2021-05-03 NOTE — Progress Notes (Signed)
Acute Office Visit  Subjective:    Patient ID: Amber Stephenson, female    DOB: 10/09/1945, 75 y.o.   MRN: [id number]  Chief Complaint  Patient presents with   Vaginitis    HPI Patient is in today for vaginal itching and irritation x 1 day. She also report thick, white, clumpy vaginal discharge. She denies dysuria, urgency, frequency, fever, chills, hematuria, vaginal bleeding, vomiting, abdominal pain, or concerns for STDs. She has had yeast infection before and this feels the same to her.   She is also due for a prolia injection today for osteoporosis.   Past Medical History:  Diagnosis Date   Acute on chronic respiratory failure with hypoxia ([place]) 01/22/2019   Asthma    Atrial fibrillation with RVR 05/19/2017   Bipolar I disorder 01/23/2015   CAD (coronary artery disease)    Nonobstructive; Managed by Dr. Bronson Ing   Cardiomegaly 01/12/2018   Chronic anticoagulation 05/30/2015   Chronic atrial fibrillation ([place]) 01/04/2015   Chronic back pain    Lower back   Chronic diastolic CHF (congestive heart failure) 05/30/2015   Diabetes mellitus, type 2, without complication    Diabetic neuropathy 02/06/2016   Dysrhythmia    A-Fib   Essential hypertension    Herpes genitalis in women 07/16/2015   Hyperlipidemia    Hypoxia 02/01/2019   Insomnia 01/23/2015   Lipoma 02/08/2015   Major depressive disorder    Migraine headache with aura 02/12/2016   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   NAFL (nonalcoholic fatty liver) 33/82/5053   OSA (obstructive sleep apnea) 02/24/2019   10/09/2018 - HST  - AHI 40.6    Pulmonary hypertension    Renal insufficiency    Managed by Dr. Wallace Keller   RLS (restless legs syndrome) 04/27/2015    Past Surgical History:  Procedure Laterality Date   BREAST REDUCTION SURGERY     EYE SURGERY Right    cateracts   HAMMER TOE SURGERY     LEFT HEART CATH AND CORONARY ANGIOGRAPHY N/A 02/03/2018   Procedure: LEFT HEART CATH AND CORONARY  ANGIOGRAPHY;  Surgeon: Martinique, Peter M, MD;  Location: [facility];  Service: Cardiovascular;  Laterality: N/A;   REVERSE SHOULDER ARTHROPLASTY Right 08/19/2019   Procedure: REVERSE SHOULDER ARTHROPLASTY;  Surgeon: Netta Cedars, MD;  Location: [facility];  Service: Orthopedics;  Laterality: Right;  interscalene block   SHOULDER SURGERY Right    "I BROKE MY SHOUDLER   THIGH SURGERY     "TO REMOVE A TUMOR "    Family History  Problem Relation Age of Onset   Diabetes Mother    Heart disease Mother    Heart disease Brother    Heart disease Sister        CABG   Diabetes Sister    Alzheimer's disease Father    Alcohol abuse Sister    Mental illness Brother    Diabetes Brother     Social History   Socioeconomic History   Marital status: Married    Spouse name: Orpah Greek   Number of children: 3   Years of education: 15   Highest education level: Bachelor's degree (e.g., BA, AB, BS)  Occupational History   Occupation: Retired    Comment: marketing  Tobacco Use   Smoking status: Never   Smokeless tobacco: Never  Vaping Use   Vaping Use: Never used  Substance and Sexual Activity   Alcohol use: Not Currently    Alcohol/week: 0.0 standard drinks   Drug use: No  Sexual activity: Yes    Partners: Male    Birth control/protection: Post-menopausal  Other Topics Concern   Not on file  Social History Narrative   Lives at home with husband.    They have 3 children who live away - [place], [place], and [place].   She is from [place] and most of her family lives there.   Her husbands's family live nearby   Right handed.   Social Determinants of Health   Financial Resource Strain: Low Risk    Difficulty of Paying Living Expenses: Not hard at all  Food Insecurity: No Food Insecurity   Worried About Charity fundraiser in the Last Year: Never true   [place] in the Last Year: Never true  Transportation Needs: No Transportation Needs   Lack of Transportation  (Medical): No   Lack of Transportation (Non-Medical): No  Physical Activity: Inactive   Days of Exercise per Week: 0 days   Minutes of Exercise per Session: 0 min  Stress: Stress Concern Present   Feeling of Stress : To some extent  Social Connections: Moderately Integrated   Frequency of Communication with Friends and Family: More than three times a week   Frequency of Social Gatherings with Friends and Family: Once a week   Attends Religious Services: More than 4 times per year   Active Member of [employer] or Organizations: No   Attends Archivist Meetings: Never   Marital Status: Married  Human resources officer Violence: Not At Risk   Fear of Current or Ex-Partner: No   Emotionally Abused: No   Physically Abused: No   Sexually Abused: No    Outpatient Medications Prior to Visit  Medication Sig Dispense Refill   acetaminophen (TYLENOL) 325 MG tablet Take 2 tablets (650 mg total) by mouth every 6 (six) hours as needed for mild pain, moderate pain or fever. 12 tablet 0   albuterol (PROVENTIL) (2.5 MG/3ML) 0.083% nebulizer solution Take 3 mLs (2.5 mg total) by nebulization every 6 (six) hours as needed for wheezing or shortness of breath. 150 mL 0   albuterol (VENTOLIN HFA) 108 (90 Base) MCG/ACT inhaler Inhale 2 puffs into the lungs every 6 (six) hours as needed for wheezing or shortness of breath. 1 each 0   alendronate (FOSAMAX) 70 MG tablet Take 1 tablet (70 mg total) by mouth every 7 (seven) days. Take with a full glass of water on an empty stomach. Do not lay down for at least 2 hours 4 tablet 11   apixaban (ELIQUIS) 5 MG TABS tablet Take 1 tablet (5 mg total) by mouth 2 (two) times daily. 60 tablet 0   ARIPiprazole (ABILIFY) 10 MG tablet Take 3 tablets (30 mg total) by mouth daily. 90 tablet 0   atorvastatin (LIPITOR) 40 MG tablet Take 1 tablet (40 mg total) by mouth daily. 90 tablet 3   carbamazepine (CARBATROL) 100 MG 12 hr capsule Take by mouth at bedtime.     Continuous Blood  Gluc Sensor (DEXCOM G6 SENSOR) MISC 1 Device by Does not apply route as directed. 9 each 3   Continuous Blood Gluc Transmit (DEXCOM G6 TRANSMITTER) MISC 1 Device by Does not apply route as directed. 1 each 3   denosumab (PROLIA) 60 MG/ML SOSY injection Inject 60 mg into the skin every 6 (six) months. 1 mL 1   diltiazem (CARDIZEM CD) 120 MG 24 hr capsule Take 1 capsule (120 mg total) by mouth at bedtime. 90 capsule 2  empagliflozin (JARDIANCE) 25 MG TABS tablet Take 1 tablet (25 mg total) by mouth daily before breakfast. 90 tablet 2   escitalopram (LEXAPRO) 10 MG tablet Take 1 tablet (10 mg total) by mouth daily. 30 tablet 0   flurazepam (DALMANE) 30 MG capsule Take 30 mg by mouth at bedtime as needed.     Fluticasone-Salmeterol (ADVAIR DISKUS) 500-50 MCG/DOSE AEPB Inhale 1 puff into the lungs in the morning and at bedtime. 60 each 0   furosemide (LASIX) 40 MG tablet Take 1 tablet (40 mg total) by mouth every morning. TAKE 1 TABLET EVERY MORNING 30 tablet 3   Galcanezumab-gnlm (EMGALITY) 120 MG/ML SOAJ Administer once a month starting 09/27/2020 1.12 mL 11   HYDROmorphone (DILAUDID) 2 MG tablet Take 1 tablet (2 mg total) by mouth every 6 (six) hours as needed for severe pain. 120 tablet 0   icosapent Ethyl (VASCEPA) 1 g capsule TAKE 2 CAPSULES TWICE A DAY WITH MEALS 120 capsule 0   insulin glargine (LANTUS SOLOSTAR) 100 UNIT/ML Solostar Pen Inject 45 Units into the skin 2 (two) times daily. 30 mL 2   insulin lispro (HUMALOG KWIKPEN) 100 UNIT/ML KwikPen 10 units AC, TID 15 mL 3   LORazepam (ATIVAN) 1 MG tablet Take 1 tablet (1 mg total) by mouth 2 (two) times daily. And give 2 tablets by mouth at bedtime 90 tablet 0   meclizine (ANTIVERT) 12.5 MG tablet Take 1 tablet (12.5 mg total) by mouth 3 (three) times daily as needed for dizziness. 30 tablet 0   metFORMIN (GLUCOPHAGE) 500 MG tablet TAKE 1 TABLET(500 MG) BY MOUTH TWICE DAILY AFTER A MEAL 180 tablet 3   metoprolol succinate (TOPROL XL) 25 MG 24 hr  tablet Take 1 tablet (25 mg total) by mouth in the morning and at bedtime. 180 tablet 3   metoprolol succinate (TOPROL-XL) 100 MG 24 hr tablet Take 1 tablet (100 mg total) by mouth in the morning and at bedtime. 180 tablet 3   NON FORMULARY Diet: _____ Regular, ___  __ NAS, ___x____Consistent Carbohydrate, _______NPO _____Other     polyethylene glycol (MIRALAX / GLYCOLAX) 17 g packet Take 17 g by mouth daily. 14 each 0   potassium chloride SA (KLOR-CON) 20 MEQ tablet Take 1 tablet (20 mEq total) by mouth daily. 30 tablet 0   pregabalin (LYRICA) 200 MG capsule Take 1 capsule (200 mg total) by mouth 2 (two) times daily. 60 capsule 5   Semaglutide (RYBELSUS) 14 MG TABS Take 14 mg by mouth daily. 90 tablet 1   tiZANidine (ZANAFLEX) 4 MG capsule TAKE 1 CAPSULE BY MOUTH THREE TIMES DAILY AS NEEDED FOR MUSCLE SPASMS 270 capsule 0   traZODone (DESYREL) 100 MG tablet TAKE 4 TABLETS AT BEDTIME 360 tablet 3   triamcinolone cream (KENALOG) 0.1 % SMARTSIG:1 Application Topical 2-3 Times Daily     Vitamin D, Cholecalciferol, 25 MCG (1000 UT) TABS Take 1 tablet by mouth daily in the afternoon.     metroNIDAZOLE (METROGEL) 0.75 % gel Apply topically.     Facility-Administered Medications Prior to Visit  Medication Dose Route Frequency Provider Last Rate Last Admin   bupivacaine-epinephrine (MARCAINE W/ EPI) 0.5% -1:200000 injection    Anesthesia Intra-op Effie Berkshire, MD   12 mL at 01/21/19 0935    Allergies  Allergen Reactions   Ivp Dye [Iodinated Diagnostic Agents] Anaphylaxis and Swelling    Throat closes    Codeine Other (See Comments)   Iodine Other (See Comments)   Latex  Latex tape pulls skin with it    Review of Systems As per HPI.     Objective:    Physical Exam Vitals and nursing note reviewed.  Constitutional:      General: She is not in acute distress.    Appearance: She is not ill-appearing, toxic-appearing or diaphoretic.  Cardiovascular:     Rate and Rhythm: Normal  rate and regular rhythm.     Heart sounds: Normal heart sounds. No murmur heard. Pulmonary:     Effort: Pulmonary effort is normal. No respiratory distress.  Abdominal:     General: There is no distension.     Palpations: Abdomen is soft.     Tenderness: There is no abdominal tenderness. There is no guarding or rebound.  Skin:    General: Skin is warm and dry.  Neurological:     Mental Status: She is alert and oriented to person, place, and time.  Psychiatric:        Mood and Affect: Mood normal.        Behavior: Behavior normal.    BP 121/83   Pulse (!) 56   Temp (!) 97.4 F (36.3 C) (Temporal)   Ht 5' 2"  (1.575 m)   Wt 198 lb 8 oz (90 kg)   BMI 36.31 kg/m  Wt Readings from Last 3 Encounters:  05/03/21 198 lb 8 oz (90 kg)  04/29/21 198 lb (89.8 kg)  04/16/21 192 lb (87.1 kg)    Health Maintenance Due  Topic Date Due   Zoster Vaccines- Shingrix (1 of 2) Never done   OPHTHALMOLOGY EXAM  11/10/2019   FOOT EXAM  05/19/2020   COVID-19 Vaccine (4 - Booster for Moderna series) 06/27/2020   URINE MICROALBUMIN  08/28/2020    There are no preventive care reminders to display for this patient.   Lab Results  Component Value Date   TSH 2.340 08/29/2019   Lab Results  Component Value Date   WBC 12.6 (H) 04/03/2021   HGB 16.2 (H) 04/03/2021   HCT 49.4 (H) 04/03/2021   MCV 87 04/03/2021   PLT 222 04/03/2021   Lab Results  Component Value Date   NA 137 04/03/2021   K 3.8 04/03/2021   CO2 24 04/03/2021   GLUCOSE 273 (H) 04/03/2021   BUN 14 04/03/2021   CREATININE 1.35 (H) 04/03/2021   BILITOT 0.4 04/03/2021   ALKPHOS 141 (H) 04/03/2021   AST 19 04/03/2021   ALT 11 04/03/2021   PROT 7.3 04/03/2021   ALBUMIN 4.5 04/03/2021   CALCIUM 9.5 04/03/2021   ANIONGAP 11 12/16/2020   EGFR 41 (L) 04/03/2021   GFR 59.25 (L) 08/07/2015   Lab Results  Component Value Date   CHOL 161 04/03/2021   Lab Results  Component Value Date   HDL 41 04/03/2021   Lab Results   Component Value Date   LDLCALC 70 04/03/2021   Lab Results  Component Value Date   TRIG 311 (H) 04/03/2021   Lab Results  Component Value Date   CHOLHDL 3.9 04/03/2021   Lab Results  Component Value Date   HGBA1C 6.7 (H) 04/03/2021       Assessment & Plan:   Alixandrea was seen today for vaginitis.  Diagnoses and all orders for this visit:  Acute vaginitis Patient was unable to self- swab for wet prep today. She also declined pelvic exam due to chronic back pain. History consistent with yeast vaginitis. Diflucan as below.  -     WET PREP  FOR TRICH, YEAST, CLUE -     fluconazole (DIFLUCAN) 150 MG tablet; Take one tablet now by mouth. May repeat in 3 days if symptoms persist.  Age-related osteoporosis without current pathological fracture Injection today in office.  -     denosumab (PROLIA) injection 60 mg  Essential hypertension Well controlled on current regimen.   Return if symptoms worsen or fail to improve.  The patient indicates understanding of these issues and agrees with the plan.   Gwenlyn Perking, FNP

## 2021-05-06 ENCOUNTER — Ambulatory Visit: Payer: Medicare Other | Admitting: Family Medicine

## 2021-05-10 ENCOUNTER — Telehealth: Payer: Self-pay | Admitting: Physical Medicine and Rehabilitation

## 2021-05-10 MED ORDER — HYDROMORPHONE HCL 2 MG PO TABS: 2.0000 mg | ORAL_TABLET | Freq: Four times a day (QID) | ORAL | 0 refills | Status: DC | PRN

## 2021-05-10 NOTE — Addendum Note (Signed)
Addended by: Courtney Heys on: 05/10/2021 02:23 PM   Modules accepted: Orders

## 2021-05-10 NOTE — Telephone Encounter (Signed)
Patient needs refill on Dulatin has 6 days left call into [facility] in [place] on [address]

## 2021-05-13 DIAGNOSIS — F251 Schizoaffective disorder, depressive type: Secondary | ICD-10-CM | POA: Diagnosis not present

## 2021-05-14 DIAGNOSIS — M542 Cervicalgia: Secondary | ICD-10-CM | POA: Diagnosis not present

## 2021-05-14 DIAGNOSIS — M5440 Lumbago with sciatica, unspecified side: Secondary | ICD-10-CM | POA: Diagnosis not present

## 2021-05-14 LAB — HM DIABETES EYE EXAM

## 2021-05-24 ENCOUNTER — Other Ambulatory Visit: Payer: Self-pay | Admitting: Family Medicine

## 2021-05-24 DIAGNOSIS — N760 Acute vaginitis: Secondary | ICD-10-CM

## 2021-05-28 ENCOUNTER — Encounter: Payer: Self-pay | Admitting: Family Medicine

## 2021-05-28 ENCOUNTER — Ambulatory Visit (INDEPENDENT_AMBULATORY_CARE_PROVIDER_SITE_OTHER): Payer: Medicare Other | Admitting: Family Medicine

## 2021-05-28 VITALS — BP 110/80 | HR 92 | Temp 96.8°F | Ht 62.0 in | Wt 195.2 lb

## 2021-05-28 DIAGNOSIS — N760 Acute vaginitis: Secondary | ICD-10-CM | POA: Diagnosis not present

## 2021-05-28 DIAGNOSIS — I2510 Atherosclerotic heart disease of native coronary artery without angina pectoris: Secondary | ICD-10-CM | POA: Diagnosis not present

## 2021-05-28 LAB — WET PREP FOR TRICH, YEAST, CLUE
Clue Cell Exam: NEGATIVE
Trichomonas Exam: NEGATIVE
Yeast Exam: NEGATIVE

## 2021-05-28 MED ORDER — FLUCONAZOLE 150 MG PO TABS: 150.0000 mg | ORAL_TABLET | Freq: Once | ORAL | 0 refills | Status: AC

## 2021-05-28 MED ORDER — METRONIDAZOLE 0.75 % VA GEL: 1.0000 | Freq: Every day | VAGINAL | 0 refills | Status: AC

## 2021-05-28 NOTE — Progress Notes (Signed)
Chief Complaint  Patient presents with   Vaginitis    HPI  Patient presents today for itching, labial swelling and vaginal DC starting 5 days ago. No description available for the DC. Making her feel miserable. Prone to yeast. Had diflucan last month and it got rid of it until onset of this infection.   PMH: Smoking status noted ROS: Per HPI  Objective: BP 110/80    Pulse 92    Temp (!) 96.8 F (36 C)    Ht 5\' 2"  (1.575 m)    Wt 195 lb 3.2 oz (88.5 kg)    SpO2 93%    BMI 35.70 kg/m  Gen: NAD, alert, cooperative with exam HEENT: NCAT, EOMI, PERRL CV: RRR, good S1/S2, no murmur Resp: CTABL, no wheezes, non-labored Abd: SNTND Ext: No edema, warm Neuro: Alert and oriented, No gross deficits Wet Prep. NO yeast, clue cells or trich. + for bacteria.  Assessment and plan:  1. Acute vaginitis     Meds ordered this encounter  Medications   fluconazole (DIFLUCAN) 150 MG tablet    Sig: Take 1 tablet (150 mg total) by mouth once for 1 dose. At onset of symptoms. Repeat at end of treatment    Dispense:  2 tablet    Refill:  0   metroNIDAZOLE (METROGEL VAGINAL) 0.75 % vaginal gel    Sig: Place 1 Applicatorful vaginally at bedtime for 7 doses.    Dispense:  70 g    Refill:  0    Orders Placed This Encounter  Procedures   WET PREP FOR [place], YEAST, CLUE    Follow up as needed.  [name], MD

## 2021-06-05 ENCOUNTER — Ambulatory Visit (INDEPENDENT_AMBULATORY_CARE_PROVIDER_SITE_OTHER): Payer: Medicare Other | Admitting: Family Medicine

## 2021-06-05 VITALS — BP 102/74 | HR 54 | Temp 97.3°F | Ht 62.0 in

## 2021-06-05 DIAGNOSIS — H8113 Benign paroxysmal vertigo, bilateral: Secondary | ICD-10-CM | POA: Diagnosis not present

## 2021-06-05 DIAGNOSIS — N760 Acute vaginitis: Secondary | ICD-10-CM | POA: Diagnosis not present

## 2021-06-05 MED ORDER — MECLIZINE HCL 25 MG PO TABS: 25.0000 mg | ORAL_TABLET | Freq: Three times a day (TID) | ORAL | 0 refills | Status: DC | PRN

## 2021-06-05 NOTE — Progress Notes (Signed)
Subjective:  Patient ID: Amber Stephenson, female    DOB: Mar 14, 1946  Age: 76 y.o. MRN: [id number]  CC: Vaginitis (Wants referral to GYN ), Anorexia (Loss 10 lbs in the last week.), and Dizziness (On going for A week )   HPI Amber Stephenson presents for continued pain and discharge from the vagina. Took the treatments from previous visits without relief. Pt. Now requesting a referral to Gyn for further evaluation.  Pt. Reports that she is getting vertigo again intermittently with change of position. It is momentary only with most episodes. She is requesting refill of meclizine. It is usually sucessful.  Depression screen [facility] 2/9 05/28/2021 05/03/2021 04/16/2021  Decreased Interest 0 0 0  Down, Depressed, Hopeless 0 0 1  PHQ - 2 Score 0 0 1  Altered sleeping - 0 0  Tired, decreased energy - 0 0  Change in appetite - 0 0  Feeling bad or failure about yourself  - 0 0  Trouble concentrating - 0 0  Moving slowly or fidgety/restless - 0 0  Suicidal thoughts - 0 0  PHQ-9 Score - 0 1  Difficult doing work/chores - Not difficult at all Not difficult at all  Some recent data might be hidden    History Amber Stephenson has a past medical history of Acute on chronic respiratory failure with hypoxia ([place]) (01/22/2019), Asthma, Atrial fibrillation with RVR (05/19/2017), Bipolar I disorder (01/23/2015), CAD (coronary artery disease), Cardiomegaly (01/12/2018), Chronic anticoagulation (05/30/2015), Chronic atrial fibrillation ([place]) (01/04/2015), Chronic back pain, Chronic diastolic CHF (congestive heart failure) (05/30/2015), Diabetes mellitus, type 2, without complication, Diabetic neuropathy (02/06/2016), Dysrhythmia, Essential hypertension, Herpes genitalis in women (07/16/2015), Hyperlipidemia, Hypoxia (02/01/2019), Insomnia (01/23/2015), Lipoma (02/08/2015), Major depressive disorder, Migraine headache with aura (02/12/2016), 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04/21/2019), NAFL (nonalcoholic fatty  liver) (85/88/5027), OSA (obstructive sleep apnea) (02/24/2019), Pulmonary hypertension, Renal insufficiency, and RLS (restless legs syndrome) (04/27/2015).   She has a past surgical history that includes THIGH SURGERY; Shoulder surgery (Right); Breast reduction surgery; Eye surgery (Right); Hammer toe surgery; LEFT HEART CATH AND CORONARY ANGIOGRAPHY (N/A, 02/03/2018); and Reverse shoulder arthroplasty (Right, 08/19/2019).   Her family history includes Alcohol abuse in her sister; Alzheimer's disease in her father; Diabetes in her brother, mother, and sister; Heart disease in her brother, mother, and sister; Mental illness in her brother.She reports that she has never smoked. She has never used smokeless tobacco. She reports that she does not currently use alcohol. She reports that she does not use drugs.    ROS Review of Systems  Constitutional: Negative.   HENT: Negative.    Eyes:  Negative for visual disturbance.  Respiratory:  Negative for shortness of breath.   Cardiovascular:  Negative for chest pain.  Gastrointestinal:  Negative for abdominal pain.  Musculoskeletal:  Negative for arthralgias.   Objective:  BP 102/74    Pulse (!) 54    Temp (!) 97.3 F (36.3 C) (Temporal)    Ht 5\' 2"  (1.575 m)    BMI 35.70 kg/m   BP Readings from Last 3 Encounters:  06/05/21 102/74  05/28/21 110/80  05/03/21 121/83    Wt Readings from Last 3 Encounters:  05/28/21 195 lb 3.2 oz (88.5 kg)  05/03/21 198 lb 8 oz (90 kg)  04/29/21 198 lb (89.8 kg)     Physical Exam Constitutional:      General: She is not in acute distress.    Appearance: She is well-developed.  Cardiovascular:     Rate and Rhythm:  Normal rate and regular rhythm.  Pulmonary:     Breath sounds: Normal breath sounds.  Musculoskeletal:        General: Normal range of motion.  Skin:    General: Skin is warm and dry.  Neurological:     Mental Status: She is alert and oriented to person, place, and time.      Assessment  & Plan:   Amber Stephenson was seen today for vaginitis, anorexia and dizziness.  Diagnoses and all orders for this visit:  Acute vaginitis -     Ambulatory referral to Gynecology  Benign paroxysmal positional vertigo due to bilateral vestibular disorder  Other orders -     meclizine (ANTIVERT) 25 MG tablet; Take 1 tablet (25 mg total) by mouth 3 (three) times daily as needed for dizziness.       I have discontinued Amber Stephenson. [place]'s meclizine. I am also having her start on meclizine. Additionally, I am having her maintain her polyethylene glycol, NON FORMULARY, Vitamin D (Cholecalciferol), albuterol, albuterol, ARIPiprazole, apixaban, escitalopram, Fluticasone-Salmeterol, LORazepam, potassium chloride SA, icosapent Ethyl, Prolia, carbamazepine, metoprolol succinate, diltiazem, Rybelsus, Lantus SoloStar, Emgality, pregabalin, metoprolol succinate, flurazepam, furosemide, acetaminophen, tiZANidine, metFORMIN, triamcinolone cream, atorvastatin, insulin lispro, traZODone, alendronate, empagliflozin, Dexcom G6 Transmitter, Dexcom G6 Sensor, and HYDROmorphone. We will continue to administer denosumab.  Allergies as of 06/05/2021       Reactions   Ivp Dye [iodinated Contrast Media] Anaphylaxis, Swelling   Throat closes   Codeine Other (See Comments)   Iodine Other (See Comments)   Latex    Latex tape pulls skin with it        Medication List        Accurate as of June 05, 2021 11:59 PM. If you have any questions, ask your nurse or doctor.          acetaminophen 325 MG tablet Commonly known as: TYLENOL Take 2 tablets (650 mg total) by mouth every 6 (six) hours as needed for mild pain, moderate pain or fever.   albuterol (2.5 MG/3ML) 0.083% nebulizer solution Commonly known as: PROVENTIL Take 3 mLs (2.5 mg total) by nebulization every 6 (six) hours as needed for wheezing or shortness of breath.   albuterol 108 (90 Base) MCG/ACT inhaler Commonly known as: VENTOLIN HFA Inhale 2  puffs into the lungs every 6 (six) hours as needed for wheezing or shortness of breath.   alendronate 70 MG tablet Commonly known as: FOSAMAX Take 1 tablet (70 mg total) by mouth every 7 (seven) days. Take with a full glass of water on an empty stomach. Do not lay down for at least 2 hours   apixaban 5 MG Tabs tablet Commonly known as: Eliquis Take 1 tablet (5 mg total) by mouth 2 (two) times daily.   ARIPiprazole 10 MG tablet Commonly known as: ABILIFY Take 3 tablets (30 mg total) by mouth daily.   atorvastatin 40 MG tablet Commonly known as: LIPITOR Take 1 tablet (40 mg total) by mouth daily.   carbamazepine 100 MG 12 hr capsule Commonly known as: CARBATROL Take by mouth at bedtime.   Dexcom G6 Sensor Misc 1 Device by Does not apply route as directed.   Dexcom G6 Transmitter Misc 1 Device by Does not apply route as directed.   diltiazem 120 MG 24 hr capsule Commonly known as: CARDIZEM CD Take 1 capsule (120 mg total) by mouth at bedtime.   Emgality 120 MG/ML Soaj Generic drug: Galcanezumab-gnlm Administer once a month starting 09/27/2020   empagliflozin  25 MG Tabs tablet Commonly known as: Jardiance Take 1 tablet (25 mg total) by mouth daily before breakfast.   escitalopram 10 MG tablet Commonly known as: Lexapro Take 1 tablet (10 mg total) by mouth daily.   flurazepam 30 MG capsule Commonly known as: DALMANE Take 30 mg by mouth at bedtime as needed.   Fluticasone-Salmeterol 500-50 MCG/DOSE Aepb Commonly known as: Advair Diskus Inhale 1 puff into the lungs in the morning and at bedtime.   furosemide 40 MG tablet Commonly known as: LASIX Take 1 tablet (40 mg total) by mouth every morning. TAKE 1 TABLET EVERY MORNING   HYDROmorphone 2 MG tablet Commonly known as: Dilaudid Take 1 tablet (2 mg total) by mouth every 6 (six) hours as needed for severe pain.   icosapent Ethyl 1 g capsule Commonly known as: Vascepa TAKE 2 CAPSULES TWICE A DAY WITH MEALS    insulin lispro 100 UNIT/ML KwikPen Commonly known as: HumaLOG KwikPen 10 units AC, TID   Lantus SoloStar 100 UNIT/ML Solostar Pen Generic drug: insulin glargine Inject 45 Units into the skin 2 (two) times daily.   LORazepam 1 MG tablet Commonly known as: ATIVAN Take 1 tablet (1 mg total) by mouth 2 (two) times daily. And give 2 tablets by mouth at bedtime   meclizine 25 MG tablet Commonly known as: ANTIVERT Take 1 tablet (25 mg total) by mouth 3 (three) times daily as needed for dizziness. What changed:  medication strength how much to take   metFORMIN 500 MG tablet Commonly known as: GLUCOPHAGE TAKE 1 TABLET(500 MG) BY MOUTH TWICE DAILY AFTER A MEAL   metoprolol succinate 25 MG 24 hr tablet Commonly known as: Toprol XL Take 1 tablet (25 mg total) by mouth in the morning and at bedtime.   metoprolol succinate 100 MG 24 hr tablet Commonly known as: TOPROL-XL Take 1 tablet (100 mg total) by mouth in the morning and at bedtime.   NON FORMULARY Diet: _____ Regular, ___  __ NAS, ___x____Consistent Carbohydrate, _______NPO _____Other   polyethylene glycol 17 g packet Commonly known as: MIRALAX / GLYCOLAX Take 17 g by mouth daily.   potassium chloride SA 20 MEQ tablet Commonly known as: KLOR-CON M Take 1 tablet (20 mEq total) by mouth daily.   pregabalin 200 MG capsule Commonly known as: Lyrica Take 1 capsule (200 mg total) by mouth 2 (two) times daily.   Prolia 60 MG/ML Sosy injection Generic drug: denosumab Inject 60 mg into the skin every 6 (six) months.   Rybelsus 14 MG Tabs Generic drug: Semaglutide Take 14 mg by mouth daily.   tiZANidine 4 MG capsule Commonly known as: ZANAFLEX TAKE 1 CAPSULE BY MOUTH THREE TIMES DAILY AS NEEDED FOR MUSCLE SPASMS   traZODone 100 MG tablet Commonly known as: DESYREL TAKE 4 TABLETS AT BEDTIME   triamcinolone cream 0.1 % Commonly known as: KENALOG SMARTSIG:1 Application Topical 2-3 Times Daily   Vitamin D  (Cholecalciferol) 25 MCG (1000 UT) Tabs Take 1 tablet by mouth daily in the afternoon.         Follow-up: Return in about 6 months (around 12/03/2021).  [name], M.D.

## 2021-06-09 ENCOUNTER — Other Ambulatory Visit: Payer: Self-pay | Admitting: Family Medicine

## 2021-06-09 ENCOUNTER — Encounter: Payer: Self-pay | Admitting: Family Medicine

## 2021-06-09 DIAGNOSIS — E785 Hyperlipidemia, unspecified: Secondary | ICD-10-CM

## 2021-06-09 DIAGNOSIS — E1169 Type 2 diabetes mellitus with other specified complication: Secondary | ICD-10-CM

## 2021-06-13 DIAGNOSIS — M2012 Hallux valgus (acquired), left foot: Secondary | ICD-10-CM | POA: Diagnosis not present

## 2021-06-13 DIAGNOSIS — M79672 Pain in left foot: Secondary | ICD-10-CM | POA: Diagnosis not present

## 2021-06-14 ENCOUNTER — Ambulatory Visit: Payer: Medicare Other | Admitting: Neurology

## 2021-06-18 ENCOUNTER — Telehealth: Payer: Self-pay

## 2021-06-18 ENCOUNTER — Other Ambulatory Visit (HOSPITAL_COMMUNITY)
Admission: RE | Admit: 2021-06-18 | Discharge: 2021-06-18 | Disposition: A | Discharge status: Home / Self Care | Payer: Medicare Other | Source: Ambulatory Visit | Attending: Adult Health | Admitting: Adult Health

## 2021-06-18 ENCOUNTER — Ambulatory Visit (INDEPENDENT_AMBULATORY_CARE_PROVIDER_SITE_OTHER): Payer: Medicare Other | Admitting: Adult Health

## 2021-06-18 ENCOUNTER — Encounter: Payer: Self-pay | Admitting: Adult Health

## 2021-06-18 ENCOUNTER — Other Ambulatory Visit: Payer: Self-pay

## 2021-06-18 ENCOUNTER — Encounter: Payer: Self-pay | Admitting: *Deleted

## 2021-06-18 VITALS — BP 110/56 | HR 60 | Ht 62.0 in | Wt 185.8 lb

## 2021-06-18 DIAGNOSIS — Z780 Asymptomatic menopausal state: Secondary | ICD-10-CM

## 2021-06-18 DIAGNOSIS — N898 Other specified noninflammatory disorders of vagina: Secondary | ICD-10-CM | POA: Insufficient documentation

## 2021-06-18 DIAGNOSIS — B379 Candidiasis, unspecified: Secondary | ICD-10-CM

## 2021-06-18 MED ORDER — FLUCONAZOLE 100 MG PO TABS: ORAL_TABLET | ORAL | 0 refills | Status: DC

## 2021-06-18 MED ORDER — FLUCONAZOLE 100 MG PO TABS: 100.0000 mg | ORAL_TABLET | Freq: Every day | ORAL | 0 refills | Status: DC

## 2021-06-18 MED ORDER — HYDROMORPHONE HCL 2 MG PO TABS: 2.0000 mg | ORAL_TABLET | Freq: Four times a day (QID) | ORAL | 0 refills | Status: DC | PRN

## 2021-06-18 NOTE — Progress Notes (Signed)
°  Subjective:     Patient ID: Amber Stephenson, female   DOB: 06/26/1945, 76 y.o.   MRN: [id number]  HPI [name] is a 76 year old white female,married, PM in for vaginal discharge and itching for 3 months, was treated for BV and yeast with relief. She has chronic back and vertigo and is in pain and dizzy today. PCP is Dr Livia Snellen.  Review of Systems +Vaginal discharge for 3 months,has slight odor at times  +vaginal itching for 3 months  Not currently sexually active    Has back pain and vertigo today too,which are chronic Reviewed past medical,surgical, social and family history. Reviewed medications and allergies.    Objective:   Physical Exam BP (!) 110/56 (BP Location: Right Arm, Patient Position: Sitting, Cuff Size: Large)    Pulse 60    Ht 5\' 2"  (1.575 m)    Wt 185 lb 12.8 oz (84.3 kg)    BMI 33.98 kg/m     Skin warm and dry.Pelvic: external genitalia is normal in appearance no lesions, vagina: red with white discharge,no odor,CV swab obtained,urethra has no lesions or masses noted, cervix is poorly seen. uterus: normal size, shape and contour, non tender, no masses felt, adnexa: no masses or tenderness noted. Bladder is non tender and no masses felt. Painted vagina with gentian violet. AA is 1 Fall risk is high, helped getting dressed and off table in low position and assisted to wheelchair and to the car by nurse Depression screen [facility] 2/9 06/18/2021 05/28/2021 05/03/2021  Decreased Interest 3 0 0  Down, Depressed, Hopeless 0 0 0  PHQ - 2 Score 3 0 0  Altered sleeping 0 - 0  Tired, decreased energy 3 - 0  Change in appetite 3 - 0  Feeling bad or failure about yourself  0 - 0  Trouble concentrating 0 - 0  Moving slowly or fidgety/restless 0 - 0  Suicidal thoughts 0 - 0  PHQ-9 Score 9 - 0  Difficult doing work/chores - - Not difficult at all  Some recent data might be hidden   On trazodone GAD 7 : Generalized Anxiety Score 06/18/2021 05/03/2021 04/16/2021 06/13/2020  Nervous, Anxious,  on Edge 0 0 0 0  Control/stop worrying 0 0 0 0  Worry too much - different things 0 0 0 0  Trouble relaxing 0 0 2 0  Restless 0 0 0 0  Easily annoyed or irritable 0 0 0 0  Afraid - awful might happen 0 0 0 0  Total GAD 7 Score 0 0 2 0  Anxiety Difficulty - Not difficult at all Not difficult at all -    Examination chaperoned by Celene Squibb LPN  Assessment:     1. Vaginal itching Painted with gentian violet   2. Vaginal discharge CV swab sent for GC/CHL,trich,BV and yeast   3. Yeast infection Will rx diflucan 100 mg 1 every other day for 10 days  #10 NR  4. Post-menopausal     Plan:     Follow up in about 2 weeks for recheck

## 2021-06-18 NOTE — Progress Notes (Signed)
[facility] [place] Phone: [phone] Subjective:   [name], am serving as a scribe for Dr. Hulan Saas. This visit occurred during the SARS-CoV-2 public health emergency.  Safety protocols were in place, including screening questions prior to the visit, additional usage of staff PPE, and extensive cleaning of exam room while observing appropriate contact time as indicated for disinfecting solutions.   I'm seeing this patient by the request  of:  [name], MD  CC: Neck pain, mid back pain, low back pain.  NOM:[id number]  02/22/2021 Patient is having chronic pain for quite some time.  Multiple different medications already on board at this time.  Has failed multiple different things but is starting formal physical therapy which I hope will be beneficial.  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  Reviewing patient's previous thoracic MRIs patient did have a protruding disc at T10 and T11 causing potential nerve root impingement at that level.  Patient's pain is definitely on the left side of the midline at the T10 area.  We discussed different treatment options at this time including formal physical therapy, which patient will continue but also the possibility of an epidural.  Patient would like to consider this.  Has been on the Eliquis.  We will need to see if we need approval to have her stop it prior to this injection.  Patient understands this.  Discussed with patient about been following up with [place] 4 weeks afterwards.  Patient was accompanied with husband who also asked good questions and all questions were answered.  Update 06/19/2021 Amber Stephenson is a 76 y.o. female coming in with complaint of thoracic and LBP. Did not get thoracic epidural ordered in September 2022. Patient states that her pain is mostly in thoracic spine. Pain level today is 10/10. Patient  uses dilaudid daily for pain. Patient unable to get epidural due to being allergic to contract dye. Saw Dr. Saintclair Halsted who ordered MRIs in 04/29/2021.  MRIs were independently visualized by me. MRIs of the cervical region shows the patient does have spondylolisthesis and spondylosis noted.  MRI of the lumbar spine shows the patient does not have any true spinal stenosis but does have facet arthropathy.  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    Past Medical History:  Diagnosis Date   Acute on chronic respiratory failure with hypoxia ([place]) 01/22/2019   Asthma    Atrial fibrillation with RVR 05/19/2017   Bipolar I disorder 01/23/2015   CAD (coronary artery disease)    Nonobstructive; Managed by Dr. 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 Wallace Keller   RLS (restless legs syndrome) 04/27/2015   Past Surgical History:  Procedure Laterality Date   BREAST REDUCTION SURGERY     EYE SURGERY Right    cateracts   HAMMER TOE SURGERY     LEFT HEART CATH AND CORONARY ANGIOGRAPHY N/A 02/03/2018   Procedure: LEFT HEART CATH AND CORONARY  ANGIOGRAPHY;  Surgeon: Martinique, Peter M, MD;  Location: [facility];  Service: Cardiovascular;  Laterality: N/A;   REVERSE SHOULDER ARTHROPLASTY Right 08/19/2019   Procedure: REVERSE SHOULDER ARTHROPLASTY;  Surgeon: Netta Cedars, MD;  Location: [facility];  Service: Orthopedics;  Laterality: Right;  interscalene block   SHOULDER SURGERY Right    "I BROKE MY SHOUDLER   THIGH SURGERY     "TO REMOVE A TUMOR "   Social History   Socioeconomic History   Marital status: Married    Spouse name: Orpah Greek   Number of children: 3   Years of education: 15   Highest education level: Bachelor's degree (e.g., BA, AB, BS)  Occupational History   Occupation: Retired    Comment: marketing  Tobacco Use   Smoking status: Never   Smokeless tobacco: Never  Vaping Use   Vaping Use: Never used  Substance and Sexual Activity   Alcohol use: Not Currently    Alcohol/week: 0.0 standard drinks   Drug use: No   Sexual activity: Yes    Partners: Male    Birth control/protection: Post-menopausal  Other Topics Concern   Not on file  Social History Narrative   Lives at home with husband.    They have 3 children who live away - [place], [place], and [place].   She is from [place] and most of her family lives there.   Her husbands's family live nearby   Right handed.   Social Determinants of Health   Financial Resource Strain: Low Risk    Difficulty of Paying Living Expenses: Not hard at all  Food Insecurity: No Food Insecurity   Worried About Charity fundraiser in the Last Year: Never true   [place] in the Last Year: Never true  Transportation Needs: No Transportation Needs   Lack of Transportation (Medical): No   Lack of Transportation (Non-Medical): No  Physical Activity: Inactive   Days of Exercise per Week: 0 days   Minutes of Exercise per Session: 0 min  Stress: No Stress Concern Present   Feeling of Stress : Only a little  Social Connections: Engineer, building services  of Communication with Friends and Family: More than three times a week   Frequency of Social Gatherings with Friends and Family: Twice a week   Attends Religious Services: More than 4 times per year   Active Member of [employer] or Organizations: Yes   Attends Archivist Meetings: 1 to 4 times per year   Marital Status: Married   Allergies  Allergen Reactions   Ivp Dye [Iodinated Contrast Media] Anaphylaxis and Swelling    Throat closes    Codeine Other (See Comments)   Iodine Other (See Comments)   Latex     Latex tape pulls skin with it   Family History  Problem Relation Age of Onset   Diabetes Mother    Heart disease Mother    Heart disease Brother    Heart disease Sister  CABG   Diabetes Sister    Alzheimer's disease Father    Alcohol abuse Sister    Mental illness Brother    Diabetes Brother     Current Outpatient Medications (Endocrine & Metabolic):    alendronate (FOSAMAX) 70 MG tablet, Take 1 tablet (70 mg total) by mouth every 7 (seven) days. Take with a full glass of water on an empty stomach. Do not lay down for at least 2 hours   denosumab (PROLIA) 60 MG/ML SOSY injection, Inject 60 mg into the skin every 6 (six) months.   empagliflozin (JARDIANCE) 25 MG TABS tablet, Take 1 tablet (25 mg total) by mouth daily before breakfast.   insulin glargine (LANTUS SOLOSTAR) 100 UNIT/ML Solostar Pen, Inject 45 Units into the skin 2 (two) times daily.   insulin lispro (HUMALOG KWIKPEN) 100 UNIT/ML KwikPen, 10 units AC, TID   metFORMIN (GLUCOPHAGE) 500 MG tablet, TAKE 1 TABLET(500 MG) BY MOUTH TWICE DAILY AFTER A MEAL   Semaglutide (RYBELSUS) 14 MG TABS, Take 14 mg by mouth daily.  Current Facility-Administered Medications (Endocrine & Metabolic):    denosumab (PROLIA) injection 60 mg  Current Outpatient Medications (Cardiovascular):    atorvastatin (LIPITOR) 40 MG tablet, TAKE 1 TABLET DAILY   diltiazem (CARDIZEM CD) 120 MG 24 hr capsule, Take 1 capsule (120 mg  total) by mouth at bedtime.   furosemide (LASIX) 40 MG tablet, Take 1 tablet (40 mg total) by mouth every morning. TAKE 1 TABLET EVERY MORNING   icosapent Ethyl (VASCEPA) 1 g capsule, TAKE 2 CAPSULES TWICE A DAY WITH MEALS   metoprolol succinate (TOPROL XL) 25 MG 24 hr tablet, Take 1 tablet (25 mg total) by mouth in the morning and at bedtime.   metoprolol succinate (TOPROL-XL) 100 MG 24 hr tablet, Take 1 tablet (100 mg total) by mouth in the morning and at bedtime.   Current Outpatient Medications (Respiratory):    albuterol (PROVENTIL) (2.5 MG/3ML) 0.083% nebulizer solution, Take 3 mLs (2.5 mg total) by nebulization every 6 (six) hours as needed for wheezing or shortness of breath.   albuterol (VENTOLIN HFA) 108 (90 Base) MCG/ACT inhaler, Inhale 2 puffs into the lungs every 6 (six) hours as needed for wheezing or shortness of breath.   Fluticasone-Salmeterol (ADVAIR DISKUS) 500-50 MCG/DOSE AEPB, Inhale 1 puff into the lungs in the morning and at bedtime.   Current Outpatient Medications (Analgesics):    acetaminophen (TYLENOL) 325 MG tablet, Take 2 tablets (650 mg total) by mouth every 6 (six) hours as needed for mild pain, moderate pain or fever.   Galcanezumab-gnlm (EMGALITY) 120 MG/ML SOAJ, Administer once a month starting 09/27/2020   HYDROmorphone (DILAUDID) 2 MG tablet, Take 1 tablet (2 mg total) by mouth every 6 (six) hours as needed for severe pain.   Facility-Administered Medications Ordered in Other Visits (Analgesics):    bupivacaine-epinephrine (MARCAINE W/ EPI) 0.5% -1:200000 injection  Current Outpatient Medications (Hematological):    apixaban (ELIQUIS) 5 MG TABS tablet, Take 1 tablet (5 mg total) by mouth 2 (two) times daily.   Current Outpatient Medications (Other):    ARIPiprazole (ABILIFY) 10 MG tablet, Take 3 tablets (30 mg total) by mouth daily.   carbamazepine (CARBATROL) 100 MG 12 hr capsule, Take by mouth at bedtime.   Continuous Blood Gluc Sensor (DEXCOM G6  SENSOR) MISC, 1 Device by Does not apply route as directed.   Continuous Blood Gluc Transmit (DEXCOM G6 TRANSMITTER) MISC, 1 Device by Does not apply route as directed.   escitalopram (LEXAPRO)  10 MG tablet, Take 1 tablet (10 mg total) by mouth daily.   fluconazole (DIFLUCAN) 100 MG tablet, Take 1 every other day for 10 days   flurazepam (DALMANE) 30 MG capsule, Take 30 mg by mouth at bedtime as needed.   LORazepam (ATIVAN) 1 MG tablet, Take 1 tablet (1 mg total) by mouth 2 (two) times daily. And give 2 tablets by mouth at bedtime   meclizine (ANTIVERT) 25 MG tablet, Take 1 tablet (25 mg total) by mouth 3 (three) times daily as needed for dizziness.   NON FORMULARY, Diet: _____ Regular, ___  __ NAS, ___x____Consistent Carbohydrate, _______NPO _____Other   polyethylene glycol (MIRALAX / GLYCOLAX) 17 g packet, Take 17 g by mouth daily.   potassium chloride SA (KLOR-CON) 20 MEQ tablet, Take 1 tablet (20 mEq total) by mouth daily.   pregabalin (LYRICA) 200 MG capsule, Take 1 capsule (200 mg total) by mouth 2 (two) times daily.   tiZANidine (ZANAFLEX) 4 MG capsule, TAKE 1 CAPSULE BY MOUTH THREE TIMES DAILY AS NEEDED FOR MUSCLE SPASMS   traZODone (DESYREL) 100 MG tablet, TAKE 4 TABLETS AT BEDTIME   triamcinolone cream (KENALOG) 0.1 %, SMARTSIG:1 Application Topical 2-3 Times Daily   Vitamin D, Cholecalciferol, 25 MCG (1000 UT) TABS, Take 1 tablet by mouth daily in the afternoon.    Reviewed prior external information including notes and imaging from  primary care provider As well as notes that were available from care everywhere and other healthcare systems.  As we discussed previously the imaging that was reviewed by me today.  Past medical history, social, surgical and family history all reviewed in electronic medical record.  No pertanent information unless stated regarding to the chief complaint.   Review of Systems: POSITIVE muscle aches, body aches, chest pain, headaches, joint swelling,  nearly pan positive on review of symptoms.  Objective  Blood pressure 110/76, pulse 64, height 5\' 2"  (1.575 m), weight 189 lb (85.7 kg), SpO2 97 %.   General appears to be very uncomfortable.  Patient has pinpoint pupils.  Patient is having difficulty with orientation likely secondary to medications. HEENT: Pupils equal, extraocular movements intact  Respiratory: Patient's speak in full sentences and does not appear short of breath  Cardiovascular: No lower extremity edema, non tender, no erythema  Patient is uncomfortable sitting.  Patient does have tenderness to palpation diffusely in the thoracic and lumbar area.   Impression and Recommendations:     The above documentation has been reviewed and is accurate and complete Lyndal Pulley, DO

## 2021-06-18 NOTE — Telephone Encounter (Signed)
Patient is calling for a refill on Dilaudid. Per PMP, last fill was 05/11/21.

## 2021-06-18 NOTE — Addendum Note (Signed)
Addended by: Courtney Heys on: 06/18/2021 02:12 PM   Modules accepted: Orders

## 2021-06-19 ENCOUNTER — Encounter: Payer: Self-pay | Admitting: Family Medicine

## 2021-06-19 ENCOUNTER — Ambulatory Visit (INDEPENDENT_AMBULATORY_CARE_PROVIDER_SITE_OTHER): Payer: Medicare Other | Admitting: Family Medicine

## 2021-06-19 ENCOUNTER — Other Ambulatory Visit: Payer: Self-pay

## 2021-06-19 VITALS — BP 110/76 | HR 64 | Ht 62.0 in | Wt 189.0 lb

## 2021-06-19 DIAGNOSIS — E559 Vitamin D deficiency, unspecified: Secondary | ICD-10-CM

## 2021-06-19 DIAGNOSIS — M546 Pain in thoracic spine: Secondary | ICD-10-CM | POA: Diagnosis not present

## 2021-06-19 DIAGNOSIS — M2550 Pain in unspecified joint: Secondary | ICD-10-CM

## 2021-06-19 DIAGNOSIS — E038 Other specified hypothyroidism: Secondary | ICD-10-CM

## 2021-06-19 DIAGNOSIS — R079 Chest pain, unspecified: Secondary | ICD-10-CM

## 2021-06-19 DIAGNOSIS — G8929 Other chronic pain: Secondary | ICD-10-CM | POA: Diagnosis not present

## 2021-06-19 DIAGNOSIS — M5441 Lumbago with sciatica, right side: Secondary | ICD-10-CM

## 2021-06-19 LAB — CBC WITH DIFFERENTIAL/PLATELET
Basophils Absolute: 0.1 10*3/uL (ref 0.0–0.1)
Basophils Relative: 0.5 % (ref 0.0–3.0)
Eosinophils Absolute: 0.1 10*3/uL (ref 0.0–0.7)
Eosinophils Relative: 1.2 % (ref 0.0–5.0)
HCT: 48.5 % — ABNORMAL HIGH (ref 36.0–46.0)
Hemoglobin: 15.6 g/dL — ABNORMAL HIGH (ref 12.0–15.0)
Lymphocytes Relative: 27.8 % (ref 12.0–46.0)
Lymphs Abs: 3 10*3/uL (ref 0.7–4.0)
MCHC: 32.2 g/dL (ref 30.0–36.0)
MCV: 86.6 fl (ref 78.0–100.0)
Monocytes Absolute: 0.7 10*3/uL (ref 0.1–1.0)
Monocytes Relative: 6.5 % (ref 3.0–12.0)
Neutro Abs: 6.9 10*3/uL (ref 1.4–7.7)
Neutrophils Relative %: 64 % (ref 43.0–77.0)
Platelets: 233 10*3/uL (ref 150.0–400.0)
RBC: 5.6 Mil/uL — ABNORMAL HIGH (ref 3.87–5.11)
RDW: 15.7 % — ABNORMAL HIGH (ref 11.5–15.5)
WBC: 10.8 10*3/uL — ABNORMAL HIGH (ref 4.0–10.5)

## 2021-06-19 LAB — VITAMIN D 25 HYDROXY (VIT D DEFICIENCY, FRACTURES): VITD: 68.33 ng/mL (ref 30.00–100.00)

## 2021-06-19 LAB — COMPREHENSIVE METABOLIC PANEL
ALT: 9 U/L (ref 0–35)
AST: 16 U/L (ref 0–37)
Albumin: 4.1 g/dL (ref 3.5–5.2)
Alkaline Phosphatase: 88 U/L (ref 39–117)
BUN: 13 mg/dL (ref 6–23)
CO2: 25 mEq/L (ref 19–32)
Calcium: 9.3 mg/dL (ref 8.4–10.5)
Chloride: 102 mEq/L (ref 96–112)
Creatinine, Ser: 1.12 mg/dL (ref 0.40–1.20)
GFR: 48.6 mL/min — ABNORMAL LOW (ref >60.00)
Glucose, Bld: 203 mg/dL — ABNORMAL HIGH (ref 70–99)
Potassium: 4.2 mEq/L (ref 3.5–5.1)
Sodium: 138 mEq/L (ref 135–145)
Total Bilirubin: 0.4 mg/dL (ref 0.2–1.2)
Total Protein: 7.5 g/dL (ref 6.0–8.3)

## 2021-06-19 LAB — IBC PANEL
Iron: 56 ug/dL (ref 42–145)
Saturation Ratios: 15.4 % — ABNORMAL LOW (ref 20.0–50.0)
TIBC: 362.6 ug/dL (ref 250.0–450.0)
Transferrin: 259 mg/dL (ref 212.0–360.0)

## 2021-06-19 LAB — TSH: TSH: 2.28 u[IU]/mL (ref 0.35–5.50)

## 2021-06-19 LAB — FERRITIN: Ferritin: 18.5 ng/mL (ref 10.0–291.0)

## 2021-06-19 LAB — SEDIMENTATION RATE: Sed Rate: 21 mm/hr (ref 0–30)

## 2021-06-19 LAB — C-REACTIVE PROTEIN: CRP: 1.4 mg/dL (ref 0.5–20.0)

## 2021-06-19 LAB — URIC ACID: Uric Acid, Serum: 8.5 mg/dL — ABNORMAL HIGH (ref 2.4–7.0)

## 2021-06-19 NOTE — Patient Instructions (Addendum)
Epidurals at [facility]-They will call you Labs today CT chest wo-They will call you If you get worse, please go into ED See me again in 3-4 weeks after epidural

## 2021-06-19 NOTE — Assessment & Plan Note (Signed)
Patient has a chronic back pain.  Has not made any significant progress since we have seen patient previously. Patient did have an allergy to contrast dye that has slowed down the possibility of the epidurals.  Patient has had the She as well as her husband states and did just do Benadryl and seemed to do okay.  Discussed that I think that this would be more beneficial again.  We can do the injections either at neurosurgery or consider the [facility] imaging.  Patient will consider both of these.  I do think that it is worth a try and probably safer with patient's other chronic comorbidities and multiple different medication she is taking at the moment.  Discussed with patient though otherwise we do not have any other significant treatment options available to her at the moment.  Can follow-up with me again in 6 weeks after the injections.  Did discuss with the patient as well as husband that if pain significantly worsens that they do need to go to the emergency room.

## 2021-06-20 LAB — CERVICOVAGINAL ANCILLARY ONLY
Bacterial Vaginitis (gardnerella): NEGATIVE
Candida Glabrata: NEGATIVE
Candida Vaginitis: POSITIVE — AB
Chlamydia: NEGATIVE
Comment: NEGATIVE
Comment: NEGATIVE
Comment: NEGATIVE
Comment: NEGATIVE
Comment: NEGATIVE
Comment: NORMAL
Neisseria Gonorrhea: NEGATIVE
Trichomonas: NEGATIVE

## 2021-06-21 LAB — CALCIUM, IONIZED: Calcium, Ion: 5.04 mg/dL (ref 4.8–5.6)

## 2021-06-21 LAB — ANGIOTENSIN CONVERTING ENZYME: Angiotensin-Converting Enzyme: 33 U/L (ref 9–67)

## 2021-06-21 LAB — ANA: Anti Nuclear Antibody (ANA): NEGATIVE

## 2021-06-21 LAB — RHEUMATOID FACTOR: Rheumatoid fact SerPl-aCnc: <14 IU/mL (ref <14)

## 2021-06-21 LAB — PTH, INTACT AND CALCIUM
Calcium: 9.7 mg/dL (ref 8.6–10.4)
PTH: 101 pg/mL — ABNORMAL HIGH (ref 16–77)

## 2021-06-21 LAB — CYCLIC CITRUL PEPTIDE ANTIBODY, IGG: Cyclic Citrullin Peptide Ab: <16 UNITS

## 2021-06-25 ENCOUNTER — Telehealth: Payer: Self-pay | Admitting: Internal Medicine

## 2021-06-25 MED ORDER — RYBELSUS 14 MG PO TABS: 14.0000 mg | ORAL_TABLET | Freq: Every day | ORAL | 1 refills | Status: DC

## 2021-06-25 NOTE — Telephone Encounter (Signed)
MEDICATION: Semaglutide (RYBELSUS) 14 MG TABS  PHARMACY:   [facility] [place], [address] [facility] [address] Phone:  [phone]  Fax:  [phone]      HAS THE PATIENT CONTACTED THEIR PHARMACY?  YES  IS THIS A 90 DAY SUPPLY : YES  IS PATIENT OUT OF MEDICATION: YES  IF NOT; HOW MUCH IS LEFT:   LAST APPOINTMENT DATE: @11 /28/2022  NEXT APPOINTMENT DATE:@3 /29/2023  DO WE HAVE YOUR PERMISSION TO LEAVE A DETAILED MESSAGE?:  OTHER COMMENTS:    **Let patient know to contact pharmacy at the end of the day to make sure medication is ready. **  ** Please notify patient to allow 48-72 hours to process**  **Encourage patient to contact the pharmacy for refills or they can request refills through [facility]**

## 2021-06-25 NOTE — Telephone Encounter (Signed)
Refill sent.

## 2021-06-26 ENCOUNTER — Encounter: Payer: TRICARE For Life (TFL) | Admitting: Obstetrics & Gynecology

## 2021-06-28 ENCOUNTER — Ambulatory Visit
Admission: RE | Admit: 2021-06-28 | Discharge: 2021-06-28 | Disposition: A | Discharge status: Home / Self Care | Payer: Medicare Other | Source: Ambulatory Visit | Attending: Family Medicine | Admitting: Family Medicine

## 2021-06-28 ENCOUNTER — Other Ambulatory Visit: Payer: Self-pay

## 2021-06-28 DIAGNOSIS — R079 Chest pain, unspecified: Secondary | ICD-10-CM

## 2021-06-28 DIAGNOSIS — R911 Solitary pulmonary nodule: Secondary | ICD-10-CM | POA: Diagnosis not present

## 2021-07-01 ENCOUNTER — Telehealth: Payer: Self-pay | Admitting: Family Medicine

## 2021-07-01 ENCOUNTER — Encounter: Payer: Self-pay | Admitting: Family Medicine

## 2021-07-01 ENCOUNTER — Ambulatory Visit (INDEPENDENT_AMBULATORY_CARE_PROVIDER_SITE_OTHER): Payer: Medicare Other | Admitting: Family Medicine

## 2021-07-01 VITALS — BP 108/77 | HR 92 | Temp 97.6°F | Ht 62.0 in | Wt 187.4 lb

## 2021-07-01 DIAGNOSIS — R251 Tremor, unspecified: Secondary | ICD-10-CM | POA: Diagnosis not present

## 2021-07-01 MED ORDER — TRAZODONE HCL 100 MG PO TABS: 300.0000 mg | ORAL_TABLET | Freq: Every day | ORAL | 3 refills | Status: DC

## 2021-07-01 NOTE — Telephone Encounter (Signed)
I spoke to pt and she states she started about a week ago with shaking in her feet and it has moved up her body since then. Pt denies SOB, chest pain, palpitations or general weakness. She does have a history of tremors but advised pt to keep her appt this afternoon with Dr Livia Snellen for further evaluation but if she starts to develop any SOB, chest pain, general weakness or worsening symptoms to call back and pt voiced understanding.

## 2021-07-01 NOTE — Progress Notes (Signed)
Subjective:  Patient ID: Amber Stephenson, female    DOB: 04-14-1946  Age: 76 y.o. MRN: [id number]  CC: Tremors   HPI NAUTIA LEM presents for onset 1 week ago of tremors that she describes as shakes.  Her husband is here and helps with giving history he says they seem to start in her legs and work their way up to her belly into her arms and hands and then up to her head.  They are not associated with any type of neurologic numbness weakness tingling or unilateral symptoms.  They are equal bilaterally.  No headaches associated.  Depression screen [facility] 2/9 07/01/2021 06/18/2021 05/28/2021  Decreased Interest 0 3 0  Down, Depressed, Hopeless 0 0 0  PHQ - 2 Score 0 3 0  Altered sleeping - 0 -  Tired, decreased energy - 3 -  Change in appetite - 3 -  Feeling bad or failure about yourself  - 0 -  Trouble concentrating - 0 -  Moving slowly or fidgety/restless - 0 -  Suicidal thoughts - 0 -  PHQ-9 Score - 9 -  Difficult doing work/chores - - -  Some recent data might be hidden    History Tanishi has a past medical history of Acute on chronic respiratory failure with hypoxia ([place]) (01/22/2019), Asthma, Atrial fibrillation with RVR (05/19/2017), Bipolar I disorder (01/23/2015), CAD (coronary artery disease), Cardiomegaly (01/12/2018), Chronic anticoagulation (05/30/2015), Chronic atrial fibrillation (HCC) (01/04/2015), Chronic back pain, Chronic diastolic CHF (congestive heart failure) (05/30/2015), Diabetes mellitus, type 2, without complication, Diabetic neuropathy (02/06/2016), Dysrhythmia, Essential hypertension, Herpes genitalis in women (07/16/2015), Hyperlipidemia, Hypoxia (02/01/2019), Insomnia (01/23/2015), Lipoma (02/08/2015), Major depressive disorder, Migraine headache with aura (02/12/2016), 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04/21/2019), NAFL (nonalcoholic fatty liver) (75/03/2584), OSA (obstructive sleep apnea) (02/24/2019), Pulmonary hypertension, Renal insufficiency, and RLS  (restless legs syndrome) (04/27/2015).   She has a past surgical history that includes THIGH SURGERY; Shoulder surgery (Right); Breast reduction surgery; Eye surgery (Right); Hammer toe surgery; LEFT HEART CATH AND CORONARY ANGIOGRAPHY (N/A, 02/03/2018); and Reverse shoulder arthroplasty (Right, 08/19/2019).   Her family history includes Alcohol abuse in her sister; Alzheimer's disease in her father; Diabetes in her brother, mother, and sister; Heart disease in her brother, mother, and sister; Mental illness in her brother.She reports that she has never smoked. She has never used smokeless tobacco. She reports that she does not currently use alcohol. She reports that she does not use drugs.    ROS Review of Systems  Constitutional: Negative.   HENT: Negative.    Eyes:  Negative for visual disturbance.  Respiratory:  Negative for shortness of breath.   Cardiovascular:  Negative for chest pain.  Gastrointestinal:  Negative for abdominal pain.  Musculoskeletal:  Negative for arthralgias.  Neurological:  Positive for tremors. Negative for dizziness, speech difficulty, weakness and headaches.   Objective:  BP 108/77    Pulse 92    Temp 97.6 F (36.4 C)    Ht 5\' 2"  (1.575 m)    Wt 187 lb 6.4 oz (85 kg)    SpO2 95%    BMI 34.28 kg/m   BP Readings from Last 3 Encounters:  07/01/21 108/77  06/19/21 110/76  06/18/21 (!) 110/56    Wt Readings from Last 3 Encounters:  07/01/21 187 lb 6.4 oz (85 kg)  06/19/21 189 lb (85.7 kg)  06/18/21 185 lb 12.8 oz (84.3 kg)     Physical Exam Constitutional:      General: She is not in  acute distress.    Appearance: She is well-developed.  Cardiovascular:     Rate and Rhythm: Normal rate and regular rhythm.  Pulmonary:     Breath sounds: Normal breath sounds.  Musculoskeletal:        General: Normal range of motion.  Skin:    General: Skin is warm and dry.  Neurological:     General: No focal deficit present.     Mental Status: She is alert and  oriented to person, place, and time.     Cranial Nerves: No cranial nerve deficit.     Sensory: No sensory deficit.     Motor: No weakness.     Comments: As I entered the room there was no sign of tremor the patient was sitting quietly and calmly.  When I mentioned the tremors her hands began to shake and a parkinsonian-like tremor.  This was partially relieved with motion.  She did experience a wave of tremor across the abdomen at 1 point.  There was a significant parkinsonian-like tremor in the neck and head.  This seemed to improve some as she rotated the head.      Assessment & Plan:   Lenni was seen today for tremors.  Diagnoses and all orders for this visit:  Tremor  Other orders -     traZODone (DESYREL) 100 MG tablet; Take 3 tablets (300 mg total) by mouth at bedtime.   She is taking the trazodone for sleep.  She is now at 400 mg at night I asked her to cut that back to 3 at night.  I would like to wean her off of this over time.  However she does continue to have sleep problems.  She is taking multiple psychoactive medications some of which could be contributing to the parkinsonian tremor.  She has follow-up appointment with Dr. Delice Lesch scheduled for within the next few weeks.  As her neurologist, hopefully Dr. Delice Lesch can help define the tremor and find the source.  Should it be her medication hopefully she can find which one and taper it and substitute as appropriate.    I have changed [name]. Fandrich's traZODone. I am also having her maintain her polyethylene glycol, NON FORMULARY, Vitamin D (Cholecalciferol), albuterol, albuterol, ARIPiprazole, apixaban, escitalopram, Fluticasone-Salmeterol, LORazepam, potassium chloride SA, icosapent Ethyl, Prolia, carbamazepine, metoprolol succinate, diltiazem, Lantus SoloStar, Emgality, pregabalin, metoprolol succinate, flurazepam, furosemide, acetaminophen, tiZANidine, metFORMIN, triamcinolone cream, insulin lispro, alendronate,  empagliflozin, Dexcom G6 Transmitter, Dexcom G6 Sensor, meclizine, atorvastatin, HYDROmorphone, fluconazole, and Rybelsus. We will continue to administer denosumab.  Allergies as of 07/01/2021       Reactions   Ivp Dye [iodinated Contrast Media] Anaphylaxis, Swelling   Throat closes   Codeine Other (See Comments)   Iodine Other (See Comments)   Latex    Latex tape pulls skin with it        Medication List        Accurate as of July 01, 2021  5:49 PM. If you have any questions, ask your nurse or doctor.          acetaminophen 325 MG tablet Commonly known as: TYLENOL Take 2 tablets (650 mg total) by mouth every 6 (six) hours as needed for mild pain, moderate pain or fever.   albuterol (2.5 MG/3ML) 0.083% nebulizer solution Commonly known as: PROVENTIL Take 3 mLs (2.5 mg total) by nebulization every 6 (six) hours as needed for wheezing or shortness of breath.   albuterol 108 (90 Base) MCG/ACT inhaler Commonly  known as: VENTOLIN HFA Inhale 2 puffs into the lungs every 6 (six) hours as needed for wheezing or shortness of breath.   alendronate 70 MG tablet Commonly known as: FOSAMAX Take 1 tablet (70 mg total) by mouth every 7 (seven) days. Take with a full glass of water on an empty stomach. Do not lay down for at least 2 hours   apixaban 5 MG Tabs tablet Commonly known as: Eliquis Take 1 tablet (5 mg total) by mouth 2 (two) times daily.   ARIPiprazole 10 MG tablet Commonly known as: ABILIFY Take 3 tablets (30 mg total) by mouth daily.   atorvastatin 40 MG tablet Commonly known as: LIPITOR TAKE 1 TABLET DAILY   carbamazepine 100 MG 12 hr capsule Commonly known as: CARBATROL Take by mouth at bedtime.   Dexcom G6 Sensor Misc 1 Device by Does not apply route as directed.   Dexcom G6 Transmitter Misc 1 Device by Does not apply route as directed.   diltiazem 120 MG 24 hr capsule Commonly known as: CARDIZEM CD Take 1 capsule (120 mg total) by mouth at  bedtime.   Emgality 120 MG/ML Soaj Generic drug: Galcanezumab-gnlm Administer once a month starting 09/27/2020   empagliflozin 25 MG Tabs tablet Commonly known as: Jardiance Take 1 tablet (25 mg total) by mouth daily before breakfast.   escitalopram 10 MG tablet Commonly known as: Lexapro Take 1 tablet (10 mg total) by mouth daily.   fluconazole 100 MG tablet Commonly known as: DIFLUCAN Take 1 every other day for 10 days   flurazepam 30 MG capsule Commonly known as: DALMANE Take 30 mg by mouth at bedtime as needed.   Fluticasone-Salmeterol 500-50 MCG/DOSE Aepb Commonly known as: Advair Diskus Inhale 1 puff into the lungs in the morning and at bedtime.   furosemide 40 MG tablet Commonly known as: LASIX Take 1 tablet (40 mg total) by mouth every morning. TAKE 1 TABLET EVERY MORNING   HYDROmorphone 2 MG tablet Commonly known as: Dilaudid Take 1 tablet (2 mg total) by mouth every 6 (six) hours as needed for severe pain.   icosapent Ethyl 1 g capsule Commonly known as: Vascepa TAKE 2 CAPSULES TWICE A DAY WITH MEALS   insulin lispro 100 UNIT/ML KwikPen Commonly known as: HumaLOG KwikPen 10 units AC, TID   Lantus SoloStar 100 UNIT/ML Solostar Pen Generic drug: insulin glargine Inject 45 Units into the skin 2 (two) times daily.   LORazepam 1 MG tablet Commonly known as: ATIVAN Take 1 tablet (1 mg total) by mouth 2 (two) times daily. And give 2 tablets by mouth at bedtime   meclizine 25 MG tablet Commonly known as: ANTIVERT Take 1 tablet (25 mg total) by mouth 3 (three) times daily as needed for dizziness.   metFORMIN 500 MG tablet Commonly known as: GLUCOPHAGE TAKE 1 TABLET(500 MG) BY MOUTH TWICE DAILY AFTER A MEAL   metoprolol succinate 25 MG 24 hr tablet Commonly known as: Toprol XL Take 1 tablet (25 mg total) by mouth in the morning and at bedtime.   metoprolol succinate 100 MG 24 hr tablet Commonly known as: TOPROL-XL Take 1 tablet (100 mg total) by mouth in  the morning and at bedtime.   NON FORMULARY Diet: _____ Regular, ___  __ NAS, ___x____Consistent Carbohydrate, _______NPO _____Other   polyethylene glycol 17 g packet Commonly known as: MIRALAX / GLYCOLAX Take 17 g by mouth daily.   potassium chloride SA 20 MEQ tablet Commonly known as: KLOR-CON M Take 1 tablet (20  mEq total) by mouth daily.   pregabalin 200 MG capsule Commonly known as: Lyrica Take 1 capsule (200 mg total) by mouth 2 (two) times daily.   Prolia 60 MG/ML Sosy injection Generic drug: denosumab Inject 60 mg into the skin every 6 (six) months.   Rybelsus 14 MG Tabs Generic drug: Semaglutide Take 14 mg by mouth daily.   tiZANidine 4 MG capsule Commonly known as: ZANAFLEX TAKE 1 CAPSULE BY MOUTH THREE TIMES DAILY AS NEEDED FOR MUSCLE SPASMS   traZODone 100 MG tablet Commonly known as: DESYREL Take 3 tablets (300 mg total) by mouth at bedtime. What changed: how much to take Changed by: [name], MD   triamcinolone cream 0.1 % Commonly known as: KENALOG SMARTSIG:1 Application Topical 2-3 Times Daily   Vitamin D (Cholecalciferol) 25 MCG (1000 UT) Tabs Take 1 tablet by mouth daily in the afternoon.         Follow-up: Return in about 6 weeks (around 08/12/2021).  [name], M.D.

## 2021-07-02 ENCOUNTER — Ambulatory Visit: Payer: Medicare Other | Admitting: Adult Health

## 2021-07-03 DIAGNOSIS — E119 Type 2 diabetes mellitus without complications: Secondary | ICD-10-CM | POA: Diagnosis not present

## 2021-07-03 DIAGNOSIS — H40011 Open angle with borderline findings, low risk, right eye: Secondary | ICD-10-CM | POA: Diagnosis not present

## 2021-07-03 DIAGNOSIS — H10412 Chronic giant papillary conjunctivitis, left eye: Secondary | ICD-10-CM | POA: Diagnosis not present

## 2021-07-04 ENCOUNTER — Ambulatory Visit: Payer: Medicare Other | Admitting: Family Medicine

## 2021-07-04 ENCOUNTER — Telehealth: Payer: Self-pay | Admitting: Neurology

## 2021-07-04 NOTE — Telephone Encounter (Signed)
How frequent or the headaches (on average, how many days a week/month are they occurring)?  everyday How long do the headaches last?  2 hours  Verify what preventative medication and dose you are taking (e.g. topiramate, propranolol, amitriptyline, Emgality, etc)  taken emgality  Verify which rescue medication you are taking (triptan, Advil, Excedrin, Aleve, Ubrelvy, etc)  taken excedrin when needd  How often are you taking pain relievers/analgesics/rescue mediction?  Take dilaudid every day    Shaking is noticed daily its from knees up PCP told her to see neurologist told her it could be parkinson,

## 2021-07-04 NOTE — Telephone Encounter (Signed)
[name], ok to schedule with Clarise Cruz, 60 mins for new symptoms of shaking and worsening HA. thanks

## 2021-07-04 NOTE — Telephone Encounter (Signed)
Patient called to make an appt. I let her know know first avail, she stated she needs to be seen now due to HA and shaking. Pt is on waitlist

## 2021-07-09 ENCOUNTER — Ambulatory Visit: Payer: Medicare Other | Admitting: Adult Health

## 2021-07-09 DIAGNOSIS — F251 Schizoaffective disorder, depressive type: Secondary | ICD-10-CM | POA: Diagnosis not present

## 2021-07-11 NOTE — Progress Notes (Addendum)
Assessment/Plan:   Tremors due to medication  After careful evaluation, and detailed history and physical, as well as discussion with Dr. Carles Collet, our movement disorders specialist, Abilify use, may be the culprit of her symptoms.  It was explained to her that this medication side effects may last up to 6 months.  If after 6 months, these movements continue, then we will proceed with further evaluation and testing. Follow-up in 6 months Discontinue Abilify by psychiatry, will defer to that specialty the replacement of that medication for the treatment of bipolar disorder.    Amber Stephenson 07/14/2021 4:27 PM   Subjective:   Amber Stephenson was seen today in the movement disorders clinic for neurologic consultation at the request of [name], MD.  The consultation is for the evaluation of tremors of unknown etiology.  This is a 76 yo RH woman with a history of hypertension, hyperlipidemia, diabetes, atrial fibrillation, chronic pain, bipolar disorder, who presented for evaluation of tremors.  They are present for about 1 month, initially started in her legs, and "where their way up through the belly into the arms and hands and then up to the head ".  She denies any numbness, weakness, tingling or unilateral symptoms.  The tremors are bilateral.  The involuntary movements last for about 15 minutes and they are present at least every hour.  They are not triggered by emotional or physical trauma.  She takes deep breaths, and the movements subside. She denies any headaches associated with this tremors.  Patient is on Abilify for treatment of bipolar disorders.  She denies any changes in her medications.  She denies any new changes in her voice.  No family history of Parkinson's disease.  She sleeps fairly well, but she does act out in her dreams, admits to wetting pillows.  She denies any falls.  She denies any bradykinesia, .loss of sense of smell or taste.  She denies difficulty swallowing.  She  has some trouble with handwriting, she states is "wobbly ".She denies slurred speech, changes in her voice..  She denies micrographia.  She has trouble buttoning her clothing.  She has some depression, in view of her daughter being in prison, to be discharged in 1 month.  She denies any visual distortions.  Denies any nausea or vomiting, or lightheadedness.  She denies diplopia.  No excessive drooling.  No lightheadedness or syncope.  No history of melanoma.  No worsening memory loss.  Of note, in the past she had been seen at our clinic, last visit on 08/27/2020, suspecting mild vascular neurocognitive disorder.  She currently sees pain management and orthopedics, she is on pregabalin 200 mg twice daily and Dilaudid, as well as lorazepam, Abilify and carbamazepine.  History on Initial Assessment 07/17/2017: This is a 76 year old right-handed woman with a history of hypertension, hyperlipidemia, diabetes, atrial fibrillation, chronic pain, bipolar disorder, presenting for evaluation of memory changes. She feels her memory has gotten really bad over the past 2 months. She cannot remember words/word-finding difficulties. She "does not think it's Alzheimer's" but feels like she is not paying attention. Family reminds her that they had told her something already previously. Her husband has to remind her of her medications. They put her pills together in a pillbox. Her husband is in charge of finances. She stopped driving 4 years ago, she denied getting lost when she used to drive. She started noticing memory changes after she was started on a medication after she had her colonoscopy.  She has also been dealing with a lot of pain, she states her Percocet dose was cut in half a month ago and this is causing her a lot of issues. She has poor sleep, 3 hours at the most. She was recently started on Trazodone by her psychiatrist, which may be helping. Her father had Alzheimer's disease. She denies any history of significant  head injuries, no alcohol use.   She has frequent headaches, around 20 headache days a month with stabbing pain in the frontal region that can last up to 3 days, with associated nausea/vomiting. If she takes medication, pain lasts up to 2 hours. She has dizziness at least 1-2 times a day where it feels like the room is spinning. She has had falls for "no clear reason," last fall was 3 months ago. She has chronic neck and back pain, no focal numbness/tingling/weakness. She has tremors in both hands and has a diagnosis of essential tremor, it is "very difficult to do things," it has affected her handwriting, computer use. No family history of tremors. No bowel/bladder dysfunction or anosmia.     I personally reviewed MRI brain without contrast done 05/13/17 which did not show any acute changes. There was mild diffuse atrophy and mild to moderate chronic microvascular disease.  Neuropsychological testing in 04/2019 showed primary areas of impairment with processing speed and attention/concentration. 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 She also scored in the mild range of anxiety and depression, and it was felt that the combination of these with daily headaches, dizziness, sleep apnea, and chronic pain, negatively influence day to day cognitive function.   MRI brain 04/28/21  1. No acute intracranial process. 2. Mildly advanced atrophy for age, with a suggestion of slightly greater atrophy in the right temporal lobe.    PREVIOUS MEDICATIONS:   ALLERGIES:   Allergies  Allergen Reactions   Ivp Dye [Iodinated Contrast Media] Anaphylaxis and Swelling    Throat closes    Codeine Other (See Comments)   Iodine Other (See Comments)   Latex     Latex tape pulls skin with it    CURRENT MEDICATIONS:  Current Outpatient Medications  Medication Instructions   acetaminophen (TYLENOL) 650 mg, Oral,  Every 6 hours PRN   albuterol (PROVENTIL) 2.5 mg, Nebulization, Every 6 hours PRN   albuterol (VENTOLIN HFA) 108 (90 Base) MCG/ACT inhaler 2 puffs, Inhalation, Every 6 hours PRN   alendronate (FOSAMAX) 70 mg, Oral, Every 7 days, Take with a full glass of water on an empty stomach. Do not lay down for at least 2 hours   apixaban (ELIQUIS) 5 mg, Oral, 2 times daily   ARIPiprazole (ABILIFY) 30 mg, Oral, Daily   atorvastatin (LIPITOR) 40 MG tablet TAKE 1 TABLET DAILY   carbamazepine (CARBATROL) 100 MG 12 hr capsule Oral, Daily at bedtime   Continuous Blood Gluc Sensor (DEXCOM G6 SENSOR) MISC 1 Device, Does not apply, As directed   Continuous Blood Gluc Transmit (DEXCOM G6 TRANSMITTER) MISC 1 Device, Does not apply, As directed   diltiazem (CARDIZEM CD) 120 mg, Oral, Daily at bedtime   empagliflozin (JARDIANCE) 25 mg, Oral, Daily before breakfast   escitalopram (LEXAPRO) 10 mg, Oral, Daily   Eszopiclone 3 mg, Oral, At bedtime PRN   fluconazole (DIFLUCAN) 100 MG tablet Take 1 every other day for 10 days   flurazepam (DALMANE) 30 mg, Oral, At bedtime PRN   Fluticasone-Salmeterol (ADVAIR DISKUS) 500-50  MCG/DOSE AEPB 1 puff, Inhalation, 2 times daily   furosemide (LASIX) 40 mg, Oral, Every morning, TAKE 1 TABLET EVERY MORNING   Galcanezumab-gnlm (EMGALITY) 120 MG/ML SOAJ Administer once a month starting 09/27/2020   HYDROmorphone (DILAUDID) 2 mg, Oral, Every 6 hours PRN   hydrOXYzine (VISTARIL) 25 mg, Oral, 2 times daily   icosapent Ethyl (VASCEPA) 1 g capsule TAKE 2 CAPSULES TWICE A DAY WITH MEALS   insulin lispro (HUMALOG KWIKPEN) 100 UNIT/ML KwikPen 10 units AC, TID   Lantus SoloStar 45 Units, Subcutaneous, 2 times daily   LORazepam (ATIVAN) 1 mg, Oral, 2 times daily, And give 2 tablets by mouth at bedtime   meclizine (ANTIVERT) 25 mg, Oral, 3 times daily PRN   metFORMIN (GLUCOPHAGE) 500 MG tablet TAKE 1 TABLET(500 MG) BY MOUTH TWICE DAILY AFTER A MEAL   metoprolol succinate (TOPROL XL) 25 mg,  Oral, 2 times daily   metoprolol succinate (TOPROL-XL) 100 mg, Oral, 2 times daily   NON FORMULARY Diet: _____ Regular,<BR>___  __ NAS,<BR>___x____Consistent Carbohydrate,<BR>_______NPO<BR>_____Other   polyethylene glycol (MIRALAX / GLYCOLAX) 17 g, Oral, Daily   potassium chloride SA (KLOR-CON) 20 MEQ tablet 20 mEq, Oral, Daily   prednisoLONE acetate (PRED FORTE) 1 % ophthalmic suspension No dose, route, or frequency recorded.   pregabalin (LYRICA) 200 mg, Oral, 2 times daily   Prolia 60 mg, Subcutaneous, Every 6 months   Rybelsus 14 mg, Oral, Daily   tiZANidine (ZANAFLEX) 4 MG capsule TAKE 1 CAPSULE BY MOUTH THREE TIMES DAILY AS NEEDED FOR MUSCLE SPASMS   traZODone (DESYREL) 300 mg, Oral, Daily at bedtime   triamcinolone cream (KENALOG) 0.1 % SMARTSIG:1 Application Topical 2-3 Times Daily   Vitamin D, Cholecalciferol, 25 MCG (1000 UT) TABS 1 tablet, Oral, Daily    Objective:   VITALS:   Vitals:   07/12/21 1333  Pulse: (!) 122  Resp: 20  SpO2: 100%  Weight: 186 lb (84.4 kg)  Height: 5\' 2"  (1.575 m)    GEN:  The patient appears stated age and is in NAD.  There is some hypomimia HEENT:  Normocephalic, atraumatic.  The mucous membranes are moist. The superficial temporal arteries are without ropiness or tenderness. CV:  RRR Lungs:  CTAB Neck/HEME:  There are no carotid bruits bilaterally.  Neurological examination:  Orientation: The patient is alert and oriented x3.  Cranial nerves: There is good facial symmetry. Extraocular muscles are intact. The visual fields are full to confrontational testing. The speech is fluent and clear. Soft palate rises symmetrically and there is no tongue deviation. Hearing is intact to conversational tone. Sensation: Sensation is intact to light and pinprick throughout (facial, trunk, extremities). Vibration is intact at the bilateral big toe. There is no extinction with double simultaneous stimulation. There is no sensory dermatomal level  identified. Motor: Strength is 5/5 in the bilateral upper and lower extremities.  Shoulder shrug is equal and symmetric.  There is no pronator drift. Deep tendon reflexes: Deep tendon reflexes are 2/4 at the bilateral biceps, triceps, brachioradialis, patella and achilles. Plantar responses are downgoing bilaterally.  Movement examination: Tone: There is normal tone in the bilateral upper extremities.  The tone in the lower extremities is normal.  Abnormal movements: Tremors are noticeable in her hands, mild at rest, more pronounced with intention, right greater than left, she also has tremulousness in her head, which moves side to side.  She has mild tremor in the mental area. Coordination:  There is no decremation with RAM's, or FNF Gait and Station:  The patient has some difficulty arising out of a deep-seated chair without the use of the hands, needs to push the chair. The patient's stride length is mildly wide base the patient has a normal pull test.     I have reviewed and interpreted the following labs independently   Chemistry      Component Value Date/Time   NA 138 06/19/2021 1009   NA 137 04/03/2021 1442   K 4.2 06/19/2021 1009   CL 102 06/19/2021 1009   CO2 25 06/19/2021 1009   BUN 13 06/19/2021 1009   BUN 14 04/03/2021 1442   CREATININE 1.12 06/19/2021 1009      Component Value Date/Time   CALCIUM 9.7 06/19/2021 1009   CALCIUM 9.3 06/19/2021 1009   ALKPHOS 88 06/19/2021 1009   AST 16 06/19/2021 1009   ALT 9 06/19/2021 1009   BILITOT 0.4 06/19/2021 1009   BILITOT 0.4 04/03/2021 1442      Lab Results  Component Value Date   TSH 2.28 06/19/2021   Lab Results  Component Value Date   WBC 10.8 (H) 06/19/2021   HGB 15.6 (H) 06/19/2021   HCT 48.5 (H) 06/19/2021   MCV 86.6 06/19/2021   PLT 233.0 06/19/2021     Total time spent on today's visit was  45 minutes, including both face-to-face time and nonface-to-face time.  Time included that spent on review of records  (prior notes available to me/labs/imaging if pertinent), discussing treatment and goals, answering patient's questions and coordinating care.  Cc:  [name], MD

## 2021-07-12 ENCOUNTER — Other Ambulatory Visit: Payer: Self-pay

## 2021-07-12 ENCOUNTER — Encounter: Payer: Self-pay | Admitting: Physician Assistant

## 2021-07-12 ENCOUNTER — Telehealth: Payer: Self-pay | Admitting: Family Medicine

## 2021-07-12 ENCOUNTER — Ambulatory Visit (INDEPENDENT_AMBULATORY_CARE_PROVIDER_SITE_OTHER): Payer: Medicare Other | Admitting: Physician Assistant

## 2021-07-12 VITALS — HR 122 | Resp 20 | Ht 62.0 in | Wt 186.0 lb

## 2021-07-12 DIAGNOSIS — G2119 Other drug induced secondary parkinsonism: Secondary | ICD-10-CM | POA: Diagnosis not present

## 2021-07-12 DIAGNOSIS — F01A0 Vascular dementia, mild, without behavioral disturbance, psychotic disturbance, mood disturbance, and anxiety: Secondary | ICD-10-CM | POA: Diagnosis not present

## 2021-07-12 DIAGNOSIS — R251 Tremor, unspecified: Secondary | ICD-10-CM

## 2021-07-12 NOTE — Telephone Encounter (Signed)
Patients husband called back, concerned because the number that called came up as [name], [facility], states it was a foreign sounding person asking a lot of health questions and sounded like they were trying to get patient qualified for something. Wanted to let us know in case it was fraudulent and they are using Dr Livia Snellen information.

## 2021-07-12 NOTE — Patient Instructions (Signed)
Continue  Emgality monthly injections to help cut down on migraines  2. Continue on all your other medications except Abilify, which may have caused the tremors. These may last up to 6 months after discontinuation of the medicine. Please contact your psychiatrist for discontinuation of the drug due to side effects.  If tremors do not subside after 6 months then will look into other sources/causes.    4. Follow-up in 6 months

## 2021-07-12 NOTE — Telephone Encounter (Signed)
Called patient, no answer, left message to return call 

## 2021-07-12 NOTE — Telephone Encounter (Signed)
Advised patient I am unable to find what referral she is speaking of or any documentation of her speaking to anyone from here today

## 2021-07-14 DIAGNOSIS — G2119 Other drug induced secondary parkinsonism: Secondary | ICD-10-CM

## 2021-07-14 HISTORY — DX: Other drug induced secondary parkinsonism: G21.19

## 2021-07-15 ENCOUNTER — Ambulatory Visit (INDEPENDENT_AMBULATORY_CARE_PROVIDER_SITE_OTHER): Payer: Medicare Other | Admitting: Adult Health

## 2021-07-15 ENCOUNTER — Other Ambulatory Visit: Payer: Self-pay

## 2021-07-15 ENCOUNTER — Encounter: Payer: Self-pay | Admitting: Adult Health

## 2021-07-15 VITALS — BP 144/92 | HR 70 | Ht 62.0 in | Wt 185.2 lb

## 2021-07-15 DIAGNOSIS — N898 Other specified noninflammatory disorders of vagina: Secondary | ICD-10-CM | POA: Diagnosis not present

## 2021-07-15 DIAGNOSIS — B379 Candidiasis, unspecified: Secondary | ICD-10-CM

## 2021-07-15 NOTE — Progress Notes (Signed)
°  Subjective:     Patient ID: Amber Stephenson, female   DOB: [birth year], 76 y.o.   MRN: [id number]  HPI Amber Stephenson is a 76 year old white female,married, PM back in follow up on being treated for yeast with gentian violet and diflucan and is much better, she was treated 06/18/21. PCP is Dr Livia Snellen.   Review of Systems Feels much better, itching has resolved Has 1 area in rectum feels irritated Reviewed past medical,surgical, social and family history. Reviewed medications and allergies.     Objective:   Physical Exam BP (!) 144/92 (BP Location: Left Arm, Patient Position: Sitting, Cuff Size: Normal)    Pulse 70    Ht 5\' 2"  (1.575 m)    Wt 185 lb 3.2 oz (84 kg)    BMI 33.87 kg/m     Skin warm and dry.Pelvic: external genitalia is normal in appearance no lesions, painted area near rectal area with gentian violet.  Examination chaperoned by [facility] RN.  Assessment:     1. Yeast infection Will paint with gentian violet near rectal area, but seems to have resolved  2. Vaginal itching Has resolved    Plan:     Follow up prn

## 2021-07-17 ENCOUNTER — Other Ambulatory Visit: Payer: Self-pay

## 2021-07-17 MED ORDER — EMPAGLIFLOZIN 25 MG PO TABS: 25.0000 mg | ORAL_TABLET | Freq: Every day | ORAL | 2 refills | Status: DC

## 2021-07-17 NOTE — Telephone Encounter (Signed)
Script sent  

## 2021-07-19 ENCOUNTER — Other Ambulatory Visit: Payer: Self-pay

## 2021-07-19 ENCOUNTER — Encounter: Payer: Self-pay | Admitting: Physical Medicine and Rehabilitation

## 2021-07-19 ENCOUNTER — Encounter
Payer: Medicare Other | Attending: Physical Medicine and Rehabilitation | Admitting: Physical Medicine and Rehabilitation

## 2021-07-19 VITALS — BP 113/84 | HR 87 | Ht 62.0 in | Wt 187.2 lb

## 2021-07-19 DIAGNOSIS — G8929 Other chronic pain: Secondary | ICD-10-CM | POA: Diagnosis not present

## 2021-07-19 DIAGNOSIS — Z79891 Long term (current) use of opiate analgesic: Secondary | ICD-10-CM | POA: Insufficient documentation

## 2021-07-19 DIAGNOSIS — M5441 Lumbago with sciatica, right side: Secondary | ICD-10-CM | POA: Diagnosis not present

## 2021-07-19 DIAGNOSIS — G894 Chronic pain syndrome: Secondary | ICD-10-CM | POA: Diagnosis not present

## 2021-07-19 DIAGNOSIS — Z5181 Encounter for therapeutic drug level monitoring: Secondary | ICD-10-CM | POA: Insufficient documentation

## 2021-07-19 DIAGNOSIS — M7918 Myalgia, other site: Secondary | ICD-10-CM | POA: Diagnosis not present

## 2021-07-19 HISTORY — DX: Other chronic pain: G89.29

## 2021-07-19 MED ORDER — HYDROMORPHONE HCL 2 MG PO TABS: 2.0000 mg | ORAL_TABLET | Freq: Four times a day (QID) | ORAL | 0 refills | Status: DC | PRN

## 2021-07-19 MED ORDER — RYBELSUS 14 MG PO TABS: 14.0000 mg | ORAL_TABLET | Freq: Every day | ORAL | 1 refills | Status: DC

## 2021-07-19 NOTE — Progress Notes (Signed)
Patient is a 76 yr old female with hx of  CAD, pulmonary HTN- and DM- with CKD- Stage III here for right low back pain with sciatica  As well as upper back/neck pain/myofascial pain. Pt is here for chronic pain.   Saw Dr Tamala Julian 1 month ago-  Suggested epidural steroid injections. -  Suggested at [facility] imaging- or [facility]- and see if can get    Wants trigger point injections today.   More migraines worse in the last 2 weeks- so really wants injections.  Was stuck in room with no light/sound- to relieve them.   SOB is doing better/is fine per pt.   Exam- doesn't have pinpoint pupils today Trigger points L neck and R low back  Plan: Dr Tamala Julian needs to send a referral to [facility]- to get epidural steroid injections with them.  Patient here for trigger point injections for  Consent done and on chart.  Cleaned areas with alcohol and injected using a 27 gauge 1.5 inch needle  Injected  3cc Using 1% Lidocaine with no EPI  Upper traps L only Levators- L only Posterior scalenes Middle scalenes- L only Splenius Capitus Pectoralis Major Rhomboids Infraspinatus Teres Major/minor Thoracic paraspinals Lumbar paraspinals- R side- upper lumbar/lower thoracic x2 Other injections-    Patient's level of pain prior was 8/10 Current level of pain after injections is- 7/10  There was no bleeding or complications.  Patient was advised to drink a lot of water on day after injections to flush system Will have increased soreness for 12-48 hours after injections.  Can use Lidocaine patches the day AFTER injections Can use theracane on day of injections in places didn't inject Can use heating pad 4-6 hours AFTER injections  3. Refilled pain meds- Dilaudid 2 mg q6 hours prn- #120  4. F/U 2-3 months- for Trigger point inj

## 2021-07-19 NOTE — Patient Instructions (Signed)
Plan: Dr Tamala Julian needs to send a referral to [facility]- to get epidural steroid injections with them.  Patient here for trigger point injections for  Consent done and on chart.  Cleaned areas with alcohol and injected using a 27 gauge 1.5 inch needle  Injected  3cc Using 1% Lidocaine with no EPI  Upper traps L only Levators- L only Posterior scalenes Middle scalenes- L only Splenius Capitus Pectoralis Major Rhomboids Infraspinatus Teres Major/minor Thoracic paraspinals Lumbar paraspinals- R side- upper lumbar/lower thoracic x2 Other injections-    Patient's level of pain prior was 8/10 Current level of pain after injections is- 7/10  There was no bleeding or complications.  Patient was advised to drink a lot of water on day after injections to flush system Will have increased soreness for 12-48 hours after injections.  Can use Lidocaine patches the day AFTER injections Can use theracane on day of injections in places didn't inject Can use heating pad 4-6 hours AFTER injections  3. Refilled pain meds- Dilaudid 2 mg q6 hours prn- #120  4. F/U 2-3 months- for Trigger point inj

## 2021-07-26 DIAGNOSIS — I129 Hypertensive chronic kidney disease with stage 1 through stage 4 chronic kidney disease, or unspecified chronic kidney disease: Secondary | ICD-10-CM | POA: Diagnosis not present

## 2021-07-26 DIAGNOSIS — E6609 Other obesity due to excess calories: Secondary | ICD-10-CM | POA: Diagnosis not present

## 2021-07-26 DIAGNOSIS — E1122 Type 2 diabetes mellitus with diabetic chronic kidney disease: Secondary | ICD-10-CM | POA: Diagnosis not present

## 2021-07-26 DIAGNOSIS — N189 Chronic kidney disease, unspecified: Secondary | ICD-10-CM | POA: Diagnosis not present

## 2021-07-26 DIAGNOSIS — E1129 Type 2 diabetes mellitus with other diabetic kidney complication: Secondary | ICD-10-CM | POA: Diagnosis not present

## 2021-07-26 DIAGNOSIS — R809 Proteinuria, unspecified: Secondary | ICD-10-CM | POA: Diagnosis not present

## 2021-07-29 ENCOUNTER — Telehealth: Payer: Self-pay | Admitting: Adult Health

## 2021-07-29 MED ORDER — FLUCONAZOLE 150 MG PO TABS: ORAL_TABLET | ORAL | 1 refills | Status: DC

## 2021-07-29 NOTE — Telephone Encounter (Signed)
Will rx diflucan, no not take Lipitor while taking diflucan

## 2021-07-29 NOTE — Telephone Encounter (Signed)
Patient wants to see if you can call her in Diflucan for a yeast infection. Please advise. She uses [facility] on [address] in [place].

## 2021-07-29 NOTE — Telephone Encounter (Signed)
Pt aware.

## 2021-07-29 NOTE — Addendum Note (Signed)
Addended by: Derrek Monaco A on: 07/29/2021 01:38 PM   Modules accepted: Orders

## 2021-07-31 DIAGNOSIS — I129 Hypertensive chronic kidney disease with stage 1 through stage 4 chronic kidney disease, or unspecified chronic kidney disease: Secondary | ICD-10-CM | POA: Diagnosis not present

## 2021-07-31 DIAGNOSIS — R809 Proteinuria, unspecified: Secondary | ICD-10-CM | POA: Diagnosis not present

## 2021-07-31 DIAGNOSIS — E211 Secondary hyperparathyroidism, not elsewhere classified: Secondary | ICD-10-CM | POA: Diagnosis not present

## 2021-07-31 DIAGNOSIS — E1122 Type 2 diabetes mellitus with diabetic chronic kidney disease: Secondary | ICD-10-CM | POA: Diagnosis not present

## 2021-07-31 DIAGNOSIS — E6609 Other obesity due to excess calories: Secondary | ICD-10-CM | POA: Diagnosis not present

## 2021-07-31 DIAGNOSIS — N189 Chronic kidney disease, unspecified: Secondary | ICD-10-CM | POA: Diagnosis not present

## 2021-07-31 DIAGNOSIS — D751 Secondary polycythemia: Secondary | ICD-10-CM | POA: Diagnosis not present

## 2021-07-31 DIAGNOSIS — E1129 Type 2 diabetes mellitus with other diabetic kidney complication: Secondary | ICD-10-CM | POA: Diagnosis not present

## 2021-08-12 ENCOUNTER — Telehealth: Payer: Self-pay | Admitting: Student

## 2021-08-12 NOTE — Telephone Encounter (Signed)
? ?  Pt said, her pcp advised her she needs to get several tests like, echo, thoracic echo, carotid and blood test. Her brother had a massive heart attacked and died and this was the tests that her pcp recommended. She wanted to know if [place] can order it for her and if her insurance will pay for it ?

## 2021-08-12 NOTE — Telephone Encounter (Signed)
? ? ?  I am sorry to hear about the passing of her brother. She just had an echocardiogram in 11/2020 which showed normal pumping function of her heart and no significant valve abnormalities. I would not recommend repeating since she just had this within the past year. She has known mild nonobstructive CAD by catheterization in 2019 so keeping her cholesterol and diabetes under good control help to reduce the risk of further plaque buildup. Her LDL was at 70 in 04/2021 and would continue with statin therapy. Carotid dopplers are usually ordered based off bruits on examination or symptoms of dizziness or syncope. Unless she has had either of those, carotid dopplers are typically not obtained for screening purposes. Given that she has undergone testing within the past year, I would not recommend repeating those at this time unless she develops new symptoms and she should let us know in the interim if she does prior to her next visit (due for 49-monthfollow-up with myself or an MD as she previously saw SK).  ? ?Thanks,  ?BErma Heritage PA-C ?08/12/2021, 6:50 PM ?Pager: [phone]? ?

## 2021-08-13 NOTE — Telephone Encounter (Signed)
Patient will f/u tomorrow at 1020 with Dr.Turner. I discussed B.Strader PA-C message and patient is looking forward to visit with cardiologist tomorrow. ?

## 2021-08-14 ENCOUNTER — Other Ambulatory Visit: Payer: Self-pay

## 2021-08-14 ENCOUNTER — Encounter: Payer: Self-pay | Admitting: Cardiology

## 2021-08-14 ENCOUNTER — Ambulatory Visit (INDEPENDENT_AMBULATORY_CARE_PROVIDER_SITE_OTHER): Payer: Medicare Other | Admitting: Cardiology

## 2021-08-14 VITALS — BP 124/62 | HR 51 | Ht 62.0 in | Wt 194.0 lb

## 2021-08-14 DIAGNOSIS — I2510 Atherosclerotic heart disease of native coronary artery without angina pectoris: Secondary | ICD-10-CM

## 2021-08-14 DIAGNOSIS — I5032 Chronic diastolic (congestive) heart failure: Secondary | ICD-10-CM

## 2021-08-14 DIAGNOSIS — E7800 Pure hypercholesterolemia, unspecified: Secondary | ICD-10-CM

## 2021-08-14 DIAGNOSIS — I482 Chronic atrial fibrillation, unspecified: Secondary | ICD-10-CM | POA: Diagnosis not present

## 2021-08-14 DIAGNOSIS — I10 Essential (primary) hypertension: Secondary | ICD-10-CM

## 2021-08-14 NOTE — Addendum Note (Signed)
Addended by: Christella Scheuermann C on: 08/14/2021 10:52 AM ? ? Modules accepted: Orders ? ?

## 2021-08-14 NOTE — Patient Instructions (Signed)
Labwork: ?Fasting- Lipid ?ALT ? ?Follow-Up: ?Follow up with Dr. Radford Pax or APP in 1 year  ? ?Any Other Special Instructions Will Be Listed Below (If Applicable). ? ? ? ? ?If you need a refill on your cardiac medications before your next appointment, please call your pharmacy. ? ?

## 2021-08-14 NOTE — Progress Notes (Signed)
? ?Cardiology Office Note   ? ?Date:  08/14/2021  ? ?ID:  NATHANIEL YADEN, DOB 1946-03-28, MRN [id number] ? ?PCP:  [name], MD  ?Cardiologist: Fransico Him, MD ? ?Chief Complaint  ?Patient presents with  ? Atrial Fibrillation  ? Hypertension  ? Congestive Heart Failure  ? ? ?History of Present Illness:   ? ?Amber Stephenson is a 76 y.o. female with past medical history of permanent atrial fibrillation (on Eliquis), HFpEF, HTN, HLD, Type 2 DM, Stage 2-3 CKD, asthma, OSA (intolerant to CPAP), Bipolar Disorder and CAD (nonobstructive CAD by cath in 01/2018).  Echo 7/22 showed EF of 65 to 70% with no regional wall motion abnormalities and low normal RV function.  ? ?She is here today for followup and is doing well.  She denies any chest pain or pressure, SOB, DOE, PND, orthopnea, LE edema or syncope. She had the flu a few weeks ago and had some palpitations but that has resolved. She does have dizziness sometimes when going from sitting to standing and feels like the room is spinning. She is compliant with her meds and is tolerating meds with no SE.    ? ?Past Medical History:  ?Diagnosis Date  ? Acute on chronic respiratory failure with hypoxia ([place]) 01/22/2019  ? Asthma   ? Atrial fibrillation with RVR 05/19/2017  ? Bipolar I disorder 01/23/2015  ? CAD (coronary artery disease)   ? Nonobstructive; Managed by Dr. Bronson Ing  ? Cardiomegaly 01/12/2018  ? Chronic anticoagulation 05/30/2015  ? Chronic atrial fibrillation ([place]) 01/04/2015  ? Chronic back pain   ? Lower back  ? Chronic diastolic CHF (congestive heart failure) 05/30/2015  ? Diabetes mellitus, type 2, without complication   ? Diabetic neuropathy 02/06/2016  ? Dysrhythmia   ? A-Fib  ? Essential hypertension   ? Herpes genitalis in women 07/16/2015  ? Hyperlipidemia   ? Hypoxia 02/01/2019  ? Insomnia 01/23/2015  ? Lipoma 02/08/2015  ? Major depressive disorder   ? Migraine headache with aura 02/12/2016  ? 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 NAFL (nonalcoholic fatty liver) 19/41/7408  ? OSA (obstructive sleep apnea) 02/24/2019  ? 10/09/2018 - HST  - AHI 40.6   ? Pulmonary hypertension   ? Renal insufficiency   ? Managed by Dr. Wallace Keller  ? RLS (restless legs syndrome) 04/27/2015  ? ? ?Past Surgical History:  ?Procedure Laterality Date  ? BREAST REDUCTION SURGERY    ? EYE SURGERY Right   ? cateracts  ? HAMMER TOE SURGERY    ? LEFT HEART CATH AND CORONARY ANGIOGRAPHY N/A 02/03/2018  ? Procedure: LEFT HEART CATH AND CORONARY ANGIOGRAPHY;  Surgeon: Martinique, Peter M, MD;  Location: [facility];  Service: Cardiovascular;  Laterality: N/A;  ? REVERSE SHOULDER ARTHROPLASTY Right 08/19/2019  ? Procedure: REVERSE SHOULDER ARTHROPLASTY;  Surgeon: Netta Cedars, MD;  Location: [facility];  Service: Orthopedics;  Laterality: Right;  interscalene block  ? SHOULDER SURGERY Right   ? "I BROKE [place]  ? THIGH SURGERY    ? "TO REMOVE A TUMOR "  ? ? ?Current Medications: ?Outpatient Medications Prior to Visit  ?Medication Sig Dispense Refill  ? acetaminophen (TYLENOL) 325 MG tablet Take 2 tablets (650 mg total) by mouth every 6 (six) hours as needed for mild pain, moderate pain or fever. 12 tablet 0  ? albuterol (PROVENTIL) (2.5 MG/3ML) 0.083% nebulizer solution Take 3 mLs (2.5 mg total) by nebulization every 6 (six) hours as needed for wheezing or  shortness of breath. 150 mL 0  ? albuterol (VENTOLIN HFA) 108 (90 Base) MCG/ACT inhaler Inhale 2 puffs into the lungs every 6 (six) hours as needed for wheezing or shortness of breath. 1 each 0  ? alendronate (FOSAMAX) 70 MG tablet Take 1 tablet (70 mg total) by mouth every 7 (seven) days. Take with a full glass of water on an empty stomach. Do not lay down for at least 2 hours 4 tablet 11  ? apixaban (ELIQUIS) 5 MG TABS tablet Take 1 tablet (5 mg total) by mouth 2 (two) times daily. 60 tablet 0  ? ARIPiprazole (ABILIFY) 10 MG tablet Take 3 tablets (30 mg total) by mouth daily. 90 tablet 0  ? atorvastatin (LIPITOR) 40 MG  tablet TAKE 1 TABLET DAILY 90 tablet 0  ? carbamazepine (CARBATROL) 100 MG 12 hr capsule Take by mouth at bedtime.    ? Continuous Blood Gluc Sensor (DEXCOM G6 SENSOR) MISC 1 Device by Does not apply route as directed. 9 each 3  ? Continuous Blood Gluc Transmit (DEXCOM G6 TRANSMITTER) MISC 1 Device by Does not apply route as directed. 1 each 3  ? denosumab (PROLIA) 60 MG/ML SOSY injection Inject 60 mg into the skin every 6 (six) months. 1 mL 1  ? diltiazem (CARDIZEM CD) 120 MG 24 hr capsule Take 1 capsule (120 mg total) by mouth at bedtime. 90 capsule 2  ? empagliflozin (JARDIANCE) 25 MG TABS tablet Take 1 tablet (25 mg total) by mouth daily before breakfast. 90 tablet 2  ? escitalopram (LEXAPRO) 10 MG tablet Take 1 tablet (10 mg total) by mouth daily. 30 tablet 0  ? Eszopiclone 3 MG TABS Take 3 mg by mouth at bedtime as needed.    ? fluconazole (DIFLUCAN) 150 MG tablet Take 1 now and I in 3 days, do not take Lipitor while taking diflucan 2 tablet 1  ? flurazepam (DALMANE) 30 MG capsule Take 30 mg by mouth at bedtime as needed.    ? Fluticasone-Salmeterol (ADVAIR DISKUS) 500-50 MCG/DOSE AEPB Inhale 1 puff into the lungs in the morning and at bedtime. 60 each 0  ? furosemide (LASIX) 40 MG tablet Take 1 tablet (40 mg total) by mouth every morning. TAKE 1 TABLET EVERY MORNING 30 tablet 3  ? Galcanezumab-gnlm (EMGALITY) 120 MG/ML SOAJ Administer once a month starting 09/27/2020 1.12 mL 11  ? HYDROmorphone (DILAUDID) 2 MG tablet Take 1 tablet (2 mg total) by mouth every 6 (six) hours as needed for severe pain. 120 tablet 0  ? hydrOXYzine (VISTARIL) 25 MG capsule Take 25 mg by mouth 2 (two) times daily.    ? icosapent Ethyl (VASCEPA) 1 g capsule TAKE 2 CAPSULES TWICE A DAY WITH MEALS 120 capsule 0  ? insulin glargine (LANTUS SOLOSTAR) 100 UNIT/ML Solostar Pen Inject 45 Units into the skin 2 (two) times daily. 30 mL 2  ? insulin lispro (HUMALOG KWIKPEN) 100 UNIT/ML KwikPen 10 units AC, TID 15 mL 3  ? LORazepam (ATIVAN) 1 MG  tablet Take 1 tablet (1 mg total) by mouth 2 (two) times daily. And give 2 tablets by mouth at bedtime 90 tablet 0  ? meclizine (ANTIVERT) 25 MG tablet Take 1 tablet (25 mg total) by mouth 3 (three) times daily as needed for dizziness. 30 tablet 0  ? metFORMIN (GLUCOPHAGE) 500 MG tablet TAKE 1 TABLET(500 MG) BY MOUTH TWICE DAILY AFTER A MEAL 180 tablet 3  ? metoprolol succinate (TOPROL XL) 25 MG 24 hr tablet Take 1 tablet (  25 mg total) by mouth in the morning and at bedtime. 180 tablet 3  ? metoprolol succinate (TOPROL-XL) 100 MG 24 hr tablet Take 1 tablet (100 mg total) by mouth in the morning and at bedtime. 180 tablet 3  ? NON FORMULARY Diet: _____ Regular, ?___  __ NAS, ?___x____Consistent Carbohydrate, ?_______NPO ?_____Other    ? polyethylene glycol (MIRALAX / GLYCOLAX) 17 g packet Take 17 g by mouth daily. 14 each 0  ? potassium chloride SA (KLOR-CON) 20 MEQ tablet Take 1 tablet (20 mEq total) by mouth daily. 30 tablet 0  ? prednisoLONE acetate (PRED FORTE) 1 % ophthalmic suspension     ? pregabalin (LYRICA) 200 MG capsule Take 1 capsule (200 mg total) by mouth 2 (two) times daily. 60 capsule 5  ? Semaglutide (RYBELSUS) 14 MG TABS Take 1 tablet (14 mg total) by mouth daily. 90 tablet 1  ? tiZANidine (ZANAFLEX) 4 MG capsule TAKE 1 CAPSULE BY MOUTH THREE TIMES DAILY AS NEEDED FOR MUSCLE SPASMS 270 capsule 0  ? traZODone (DESYREL) 100 MG tablet Take 3 tablets (300 mg total) by mouth at bedtime. 360 tablet 3  ? triamcinolone cream (KENALOG) 0.1 % SMARTSIG:1 Application Topical 2-3 Times Daily    ? Vitamin D, Cholecalciferol, 25 MCG (1000 UT) TABS Take 1 tablet by mouth daily in the afternoon.    ? ?Facility-Administered Medications Prior to Visit  ?Medication Dose Route Frequency Provider Last Rate Last Admin  ? bupivacaine-epinephrine (MARCAINE W/ EPI) 0.5% -1:200000 injection    Anesthesia Intra-op Effie Berkshire, MD   12 mL at 01/21/19 0935  ? denosumab (PROLIA) injection 60 mg  60 mg Subcutaneous Once  Gwenlyn Perking, FNP      ?  ? ?Allergies:   Ivp dye [iodinated contrast media], Codeine, Iodine, and Latex  ? ?Social History  ? ?Socioeconomic History  ? Marital status: Married  ?  Spouse name: Orpah Greek  ?

## 2021-08-19 ENCOUNTER — Telehealth: Payer: Self-pay | Admitting: Physician Assistant

## 2021-08-19 ENCOUNTER — Other Ambulatory Visit: Payer: Self-pay

## 2021-08-19 MED ORDER — EMGALITY 120 MG/ML ~~LOC~~ SOAJ: SUBCUTANEOUS | 11 refills | Status: DC

## 2021-08-19 NOTE — Telephone Encounter (Signed)
She needs [facility]  [facility]. [facility] is too high for her. Emgality.  ? ?

## 2021-08-20 DIAGNOSIS — E1142 Type 2 diabetes mellitus with diabetic polyneuropathy: Secondary | ICD-10-CM | POA: Diagnosis not present

## 2021-08-20 DIAGNOSIS — B351 Tinea unguium: Secondary | ICD-10-CM | POA: Diagnosis not present

## 2021-08-20 DIAGNOSIS — M79676 Pain in unspecified toe(s): Secondary | ICD-10-CM | POA: Diagnosis not present

## 2021-08-20 DIAGNOSIS — L84 Corns and callosities: Secondary | ICD-10-CM | POA: Diagnosis not present

## 2021-08-22 ENCOUNTER — Telehealth: Payer: Self-pay | Admitting: Family Medicine

## 2021-08-22 NOTE — Telephone Encounter (Signed)
Bianka called from [facility] stating that they have faxed [place] 3x's regarding needing approval for patient to stop taking blood thinner for 3 days prior to injection she has to have on 08/26/21, but says they have not received the form back. ? ?Can contact [place] at [phone] ext [phone] ?

## 2021-08-26 DIAGNOSIS — M5414 Radiculopathy, thoracic region: Secondary | ICD-10-CM | POA: Diagnosis not present

## 2021-08-28 ENCOUNTER — Encounter: Payer: Self-pay | Admitting: Internal Medicine

## 2021-08-28 ENCOUNTER — Ambulatory Visit (INDEPENDENT_AMBULATORY_CARE_PROVIDER_SITE_OTHER): Payer: Medicare Other | Admitting: Internal Medicine

## 2021-08-28 VITALS — BP 140/82 | HR 80 | Wt 188.0 lb

## 2021-08-28 DIAGNOSIS — Z794 Long term (current) use of insulin: Secondary | ICD-10-CM

## 2021-08-28 DIAGNOSIS — E119 Type 2 diabetes mellitus without complications: Secondary | ICD-10-CM | POA: Diagnosis not present

## 2021-08-28 LAB — POCT GLYCOSYLATED HEMOGLOBIN (HGB A1C): Hemoglobin A1C: 7.8 % — AB (ref 4.0–5.6)

## 2021-08-28 NOTE — Patient Instructions (Addendum)
-   Continue Metformin 500 mg, 1 tablets twice daily  ?- Continue  Rybelsus  14 mg, 1 tablet in the morning  ?-Continue Lantus 38 units Twice a day  ?- Continue Jardiance 25 mg, 1 tablet every morning  ?- Humalog 8 units with each meal ( up to three times a day ) ?-Humalog correctional insulin: ADD extra units on insulin to your meal-time Humalog  dose if your blood sugars are higher than 160. Use the scale below to help guide you:  ? ?Blood sugar before meal Number of units to inject  ?Less than 160 0 unit  ?161 -  190 1 units  ?191 -  220 2 units  ?221 -  250 3 units  ?251 -  280 4 units  ?281 -  310 5 units  ?311 -  340 6 units  ?341 -  370 7 units  ?371 -  400 8 units  ? ? ? ? ? ?HOW TO TREAT LOW BLOOD SUGARS (Blood sugar LESS THAN 70 MG/DL) ?Please follow the RULE OF 15 for the treatment of hypoglycemia treatment (when your (blood sugars are less than 70 mg/dL)  ? ?STEP 1: Take 15 grams of carbohydrates when your blood sugar is low, which includes:  ?3-4 GLUCOSE TABS  OR ?3-4 OZ OF JUICE OR REGULAR SODA OR ?ONE TUBE OF GLUCOSE GEL   ? ?STEP 2: RECHECK blood sugar in 15 MINUTES ?STEP 3: If your blood sugar is still low at the 15 minute recheck --> then, go back to STEP 1 and treat AGAIN with another 15 grams of carbohydrates. ? ?

## 2021-08-28 NOTE — Progress Notes (Signed)
?Name: Amber Stephenson  ?Age/ Sex: 75 y.o., female   ?MRN/ DOB: [id number], 1946/05/13    ? ?PCP: [name], MD   ?Reason for Endocrinology Evaluation: Type 2 Diabetes Mellitus  ?Initial Endocrine Consultative Visit: 09/24/2020  ? ? ?PATIENT IDENTIFIER: Amber Stephenson is a 76 y.o. female with a past medical history of T2DM, HTN, Dyslipidemia, OSA, CHF and A.Fib. The patient has followed with Endocrinology clinic since 09/24/2020 for consultative assistance with management of her diabetes. ? ?DIABETIC HISTORY:  ?Amber Stephenson was diagnosed with DM in 2007, she has been on basal insulin for years, prandial insulin started 2016. GLP-1 agonists started in 2017. Metformin restarted 2017. Her hemoglobin A1c has ranged from 6.7% in 08/2019, peaking at 10.9% in 04/2020. ? ?Saw Dr. Dorris Fetch in 2016 and Dr. Dwyane Dee in 2017 ? ? ?On her initial visit to our clinic she had an A1c 9.1% . We increased Metformin, Rybelsus and adjusted MDI regimen.  ? ?SUBJECTIVE:  ? ?During the last visit (04/29/2021): A1c 6.8% We continued  Metformin, Rybelsus and adjusted MDI regimen , started prandial insulin and increased Jardiance  ? ? ? ?Today (08/28/2021): Amber Stephenson is here for a follow up on diabetes management.  She is accompanied by his spouse today. She checks her blood sugars multiple  times daily through the CGM. Marland Kitchen The patient has not had hypoglycemic episodes since the last clinic visit ? ? ? ?Was seen by cardiology 08/14/2021 for CHF and A. Fib  ?Received trigger injections 07/2021 ?Saw Gyn 07/2021 for pelvic exam  ?Saw neurology 07/2021 for tremors attributes to abilify  ?Has minimal nausea but no vomiting or diarrhea  ? ? ? ?HOME DIABETES REGIMEN:  ?Metformin 500 mg 1 tablet BID  ?Jardiance 25 mg daily  ?Rybelsus 14 mg daily ?Lantus 38 units BID ?Humalog 10 units TIDQAC- not taking  ? ? ? ? ?Statin: yes ?ACE-I/ARB: no ?Prior Diabetic Education: no ? ? ? ?CONTINUOUS GLUCOSE MONITORING RECORD INTERPRETATION   ? ?Dates of Recording:  3/16-3/29/2023 ? ?Sensor description: dexcom  ? ?Results statistics: ?  ?CGM use % of time 93  ?Average and SD 212/57  ?Time in range   30  %  ?% Time Above 180 44  ?% Time above 250 26  ?% Time Below target 0  ? ? ? ?Glycemic patterns summary: Hyperglycemia noted during the day ,and most of the night, it optimizes between 79 am  ? ?Hyperglycemic episodes  postprandial  ? ?Hypoglycemic episodes occurred n/a ? ?Overnight periods: trends down ? ? ? ? ? ?DIABETIC COMPLICATIONS: ?Microvascular complications: ?  ?Denies: CKD,  retinopathy , neuropathy ( she has this in the charts but denies neuropathy) ?Last eye exam: Completed 07/2020 ?  ?Macrovascular complications: ?  ?Denies: CAD, PVD, CVA ? ? ? ?HISTORY:  ?Past Medical History:  ?Past Medical History:  ?Diagnosis Date  ? Acute on chronic respiratory failure with hypoxia ([place]) 01/22/2019  ? Asthma   ? Atrial fibrillation with RVR 05/19/2017  ? Bipolar I disorder 01/23/2015  ? CAD (coronary artery disease)   ? Nonobstructive; Managed by Dr. Bronson Ing  ? Cardiomegaly 01/12/2018  ? Chronic anticoagulation 05/30/2015  ? Chronic atrial fibrillation ([place]) 01/04/2015  ? Chronic back pain   ? Lower back  ? Chronic diastolic CHF (congestive heart failure) 05/30/2015  ? Diabetes mellitus, type 2, without complication   ? Diabetic neuropathy 02/06/2016  ? Dysrhythmia   ? A-Fib  ? Essential hypertension   ? Herpes genitalis in women  07/16/2015  ? Hyperlipidemia   ? Hypoxia 02/01/2019  ? Insomnia 01/23/2015  ? Lipoma 02/08/2015  ? Major depressive disorder   ? Migraine headache with aura 02/12/2016  ? 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  ? NAFL (nonalcoholic fatty liver) 26/71/2458  ? OSA (obstructive sleep apnea) 02/24/2019  ? 10/09/2018 - HST  - AHI 40.6   ? Pulmonary hypertension   ? Renal insufficiency   ? Managed by Dr. Wallace Keller  ? RLS (restless legs syndrome) 04/27/2015  ? ?Past Surgical History:  ?Past Surgical History:  ?Procedure Laterality Date  ? BREAST  REDUCTION SURGERY    ? EYE SURGERY Right   ? cateracts  ? HAMMER TOE SURGERY    ? LEFT HEART CATH AND CORONARY ANGIOGRAPHY N/A 02/03/2018  ? Procedure: LEFT HEART CATH AND CORONARY ANGIOGRAPHY;  Surgeon: Martinique, Peter M, MD;  Location: [facility];  Service: Cardiovascular;  Laterality: N/A;  ? REVERSE SHOULDER ARTHROPLASTY Right 08/19/2019  ? Procedure: REVERSE SHOULDER ARTHROPLASTY;  Surgeon: Netta Cedars, MD;  Location: [facility];  Service: Orthopedics;  Laterality: Right;  interscalene block  ? SHOULDER SURGERY Right   ? "I BROKE [place]  ? THIGH SURGERY    ? "TO REMOVE A TUMOR "  ? ?Social History:  reports that she has never smoked. She has never used smokeless tobacco. She reports that she does not currently use alcohol. She reports that she does not use drugs. ?Family History:  ?Family History  ?Problem Relation Age of Onset  ? Diabetes Mother   ? Heart disease Mother   ? Heart disease Brother   ? Heart disease Sister   ?     CABG  ? Diabetes Sister   ? Alzheimer's disease Father   ? Alcohol abuse Sister   ? Mental illness Brother   ? Diabetes Brother   ? ? ? ?HOME MEDICATIONS: ?Allergies as of 08/28/2021   ? ?   Reactions  ? Ivp Dye [iodinated Contrast Media] Anaphylaxis, Swelling  ? Throat closes  ? Codeine Other (See Comments)  ? Iodine Other (See Comments)  ? Latex   ? Latex tape pulls skin with it  ? ?  ? ?  ?Medication List  ?  ? ?  ? Accurate as of August 28, 2021  8:13 AM. If you have any questions, ask your nurse or doctor.  ?  ?  ? ?  ? ?acetaminophen 325 MG tablet ?Commonly known as: TYLENOL ?Take 2 tablets (650 mg total) by mouth every 6 (six) hours as needed for mild pain, moderate pain or fever. ?  ?albuterol (2.5 MG/3ML) 0.083% nebulizer solution ?Commonly known as: PROVENTIL ?Take 3 mLs (2.5 mg total) by nebulization every 6 (six) hours as needed for wheezing or shortness of breath. ?  ?albuterol 108 (90 Base) MCG/ACT inhaler ?Commonly known as: VENTOLIN HFA ?Inhale 2 puffs into the lungs  every 6 (six) hours as needed for wheezing or shortness of breath. ?  ?alendronate 70 MG tablet ?Commonly known as: FOSAMAX ?Take 1 tablet (70 mg total) by mouth every 7 (seven) days. Take with a full glass of water on an empty stomach. Do not lay down for at least 2 hours ?  ?apixaban 5 MG Tabs tablet ?Commonly known as: Eliquis ?Take 1 tablet (5 mg total) by mouth 2 (two) times daily. ?  ?ARIPiprazole 10 MG tablet ?Commonly known as: ABILIFY ?Take 3 tablets (30 mg total) by mouth daily. ?  ?atorvastatin 40 MG tablet ?Commonly known  as: LIPITOR ?TAKE 1 TABLET DAILY ?  ?carbamazepine 100 MG 12 hr capsule ?Commonly known as: CARBATROL ?Take by mouth at bedtime. ?  ?Dexcom G6 Sensor Misc ?1 Device by Does not apply route as directed. ?  ?Dexcom G6 Transmitter Misc ?1 Device by Does not apply route as directed. ?  ?diltiazem 120 MG 24 hr capsule ?Commonly known as: CARDIZEM CD ?Take 1 capsule (120 mg total) by mouth at bedtime. ?  ?Emgality 120 MG/ML Soaj ?Generic drug: Galcanezumab-gnlm ?Administer once a month starting 09/27/2020 ?  ?empagliflozin 25 MG Tabs tablet ?Commonly known as: Jardiance ?Take 1 tablet (25 mg total) by mouth daily before breakfast. ?  ?escitalopram 10 MG tablet ?Commonly known as: Lexapro ?Take 1 tablet (10 mg total) by mouth daily. ?  ?Eszopiclone 3 MG Tabs ?Take 3 mg by mouth at bedtime as needed. ?  ?fluconazole 150 MG tablet ?Commonly known as: DIFLUCAN ?Take 1 now and I in 3 days, do not take Lipitor while taking diflucan ?  ?flurazepam 30 MG capsule ?Commonly known as: DALMANE ?Take 30 mg by mouth at bedtime as needed. ?  ?Fluticasone-Salmeterol 500-50 MCG/DOSE Aepb ?Commonly known as: Advair Diskus ?Inhale 1 puff into the lungs in the morning and at bedtime. ?  ?furosemide 40 MG tablet ?Commonly known as: LASIX ?Take 1 tablet (40 mg total) by mouth every morning. TAKE 1 TABLET EVERY MORNING ?  ?HYDROmorphone 2 MG tablet ?Commonly known as: Dilaudid ?Take 1 tablet (2 mg total) by mouth  every 6 (six) hours as needed for severe pain. ?  ?hydrOXYzine 25 MG capsule ?Commonly known as: VISTARIL ?Take 25 mg by mouth 2 (two) times daily. ?  ?icosapent Ethyl 1 g capsule ?Commonly known as: Optometrist

## 2021-08-29 ENCOUNTER — Encounter: Payer: Self-pay | Admitting: Internal Medicine

## 2021-08-29 MED ORDER — RYBELSUS 14 MG PO TABS: 14.0000 mg | ORAL_TABLET | Freq: Every day | ORAL | 3 refills | Status: DC

## 2021-08-29 MED ORDER — METFORMIN HCL 500 MG PO TABS: 500.0000 mg | ORAL_TABLET | Freq: Two times a day (BID) | ORAL | 3 refills | Status: DC

## 2021-08-29 MED ORDER — LANTUS SOLOSTAR 100 UNIT/ML ~~LOC~~ SOPN: 38.0000 [IU] | PEN_INJECTOR | Freq: Two times a day (BID) | SUBCUTANEOUS | 6 refills | Status: DC

## 2021-08-29 MED ORDER — INSULIN LISPRO (1 UNIT DIAL) 100 UNIT/ML (KWIKPEN): PEN_INJECTOR | SUBCUTANEOUS | 3 refills | Status: DC

## 2021-09-03 ENCOUNTER — Encounter: Payer: Self-pay | Admitting: Adult Health

## 2021-09-03 ENCOUNTER — Ambulatory Visit (INDEPENDENT_AMBULATORY_CARE_PROVIDER_SITE_OTHER): Payer: Medicare Other | Admitting: Adult Health

## 2021-09-03 ENCOUNTER — Ambulatory Visit: Payer: Medicare Other | Admitting: Adult Health

## 2021-09-03 ENCOUNTER — Other Ambulatory Visit: Payer: Self-pay | Admitting: Physical Medicine and Rehabilitation

## 2021-09-03 VITALS — BP 121/78 | HR 49 | Ht 62.0 in | Wt 190.0 lb

## 2021-09-03 DIAGNOSIS — B369 Superficial mycosis, unspecified: Secondary | ICD-10-CM | POA: Insufficient documentation

## 2021-09-03 DIAGNOSIS — I2510 Atherosclerotic heart disease of native coronary artery without angina pectoris: Secondary | ICD-10-CM

## 2021-09-03 HISTORY — DX: Superficial mycosis, unspecified: B36.9

## 2021-09-03 MED ORDER — NYSTATIN 100000 UNIT/GM EX POWD: 1.0000 "application " | Freq: Three times a day (TID) | CUTANEOUS | 3 refills | Status: DC

## 2021-09-03 MED ORDER — FLUCONAZOLE 150 MG PO TABS
ORAL_TABLET | ORAL | 1 refills | Status: DC
Start: 2021-09-03 — End: 2021-11-11

## 2021-09-03 NOTE — Progress Notes (Signed)
?  Subjective:  ?  ? Patient ID: Amber Stephenson, female   DOB: [birth year], 76 y.o.   MRN: [id number] ? ?HPI ?Amber Stephenson is a 76 year old white female, married, PM in for rash and burning in near rectum. ?PCP is Dr Livia Snellen.  ? ?Review of Systems ?Has rash in groin and some itching near rectum, burns and feels irritated ?No vaginal issues ?Reviewed past medical,surgical, social and family history. Reviewed medications and allergies.  ?   ?Objective:  ? Physical Exam ?BP 121/78 (BP Location: Left Arm, Patient Position: Sitting, Cuff Size: Normal)   Pulse (!) 49   Ht '5\' 2"'$  (1.575 m)   Wt 190 lb (86.2 kg)   BMI 34.75 kg/m?   ?  Skin warm and dry.Pelvic: external genitalia is red in creases of groin and under panniculus and in peri area. Has several  flat skin moles right groin. ?Painted with gentian violet in groin, under panniculus and peri area.  ?Fall risk Is low ?Examination chaperoned by Levy Pupa LPN ? ?Assessment:  ?   ?1. Superficial fungus infection of skin ?Painted with gentian violet  ?Will rx diflucan and nystatin powders  ?She know not to take lipitor when taking diflucan  ?Meds ordered this encounter  ?Medications  ? nystatin (MYCOSTATIN/NYSTOP) powder  ?  Sig: Apply 1 application. topically 3 (three) times daily.  ?  Dispense:  60 g  ?  Refill:  3  ?  Order Specific Question:   Supervising Provider  ?  Answer:   Tania Ade H [[id number]]  ? fluconazole (DIFLUCAN) 150 MG tablet  ?  Sig: Take 1 now and repeat 1 in 3 days  ?  Dispense:  2 tablet  ?  Refill:  1  ?  Order Specific Question:   Supervising Provider  ?  Answer:   Tania Ade H [[id number]]  ?  ?   ?Plan:  ?   ?Follow up prn  ?   ?

## 2021-09-11 ENCOUNTER — Telehealth: Payer: Self-pay | Admitting: Family Medicine

## 2021-09-11 NOTE — Telephone Encounter (Signed)
?  Prescription Request ? ?09/11/2021 ? ?What is the name of the medication or equipment? HUMALOG NEEDLES ? ?Have you contacted your pharmacy to request a refill? YES ? ?Which pharmacy would you like this sent to? [facility], [place] ? ? ?  ?

## 2021-09-11 NOTE — Telephone Encounter (Signed)
Recommended to patient to call her Endocrinologist for this script since they prescribe all of her insulin medications ?

## 2021-09-12 DIAGNOSIS — H04122 Dry eye syndrome of left lacrimal gland: Secondary | ICD-10-CM | POA: Diagnosis not present

## 2021-09-13 NOTE — Telephone Encounter (Signed)
Pt aware to call endo. ?

## 2021-09-16 ENCOUNTER — Telehealth: Payer: Self-pay

## 2021-09-16 ENCOUNTER — Other Ambulatory Visit: Payer: Self-pay

## 2021-09-16 MED ORDER — PEN NEEDLES 30G X 5 MM MISC: 2 refills | Status: DC

## 2021-09-16 NOTE — Telephone Encounter (Signed)
Spoke to this patient on 09/16/2021 at 1700 ? ? ? ?Patient did have 2 episodes of hypoglycemia BG 60 mg/DL in the middle of the day.  This was after bolusing with Humalog at 8 units ? ? ? ? ?Patient advised to reduce Humalog to 6 units 3 times daily before every meal and continue the rest of her glycemic agents ?

## 2021-09-16 NOTE — Telephone Encounter (Signed)
Patient states that her blood sugar had fell to the 60 over the weekend 2 times. Patient states that she stop the Humalog and Metformin for the weekend. Patient is now back on both medication and her blood sugar seem to normalize.  ?

## 2021-09-17 ENCOUNTER — Ambulatory Visit: Payer: Medicare Other | Admitting: Family Medicine

## 2021-09-18 DIAGNOSIS — Z20822 Contact with and (suspected) exposure to covid-19: Secondary | ICD-10-CM | POA: Diagnosis not present

## 2021-09-19 ENCOUNTER — Telehealth: Payer: Self-pay | Admitting: Cardiology

## 2021-09-19 NOTE — Telephone Encounter (Signed)
? ?  Pt c/o medication issue: ? ?1. Name of Medication: furosemide (LASIX) 40 MG tablet ? ?2. How are you currently taking this medication (dosage and times per day)? Take 1 tablet (40 mg total) by mouth every morning. TAKE 1 TABLET EVERY MORNING ? ?3. Are you having a reaction (difficulty breathing--STAT)?  ? ?4. What is your medication issue? Pt said, Dr. Radford Pax changed her dosage from 40 mg to 20 mg. She also have questions about this meds ?

## 2021-09-19 NOTE — Telephone Encounter (Signed)
08/14/21 office note from [place] -"Continue prescription drug management with Lasix 40 mg daily and Jardiance '25mg'$  daily with as needed refills"  ? ? ? ? ?I spoke with patient and she states she knew lasix dose was not reduced to 20 mg,however, she does not like the dose because she is uncomfortable with how often she has to urinate.She will not make any changes with dose. ?

## 2021-09-19 NOTE — Telephone Encounter (Signed)
Patient called status of phone call.  ?

## 2021-09-20 NOTE — Telephone Encounter (Signed)
Patient advise and verbalized understanding. 

## 2021-09-20 NOTE — Telephone Encounter (Signed)
Patient left a message stating that her sugar have continue to drop. I attempted to contact patient at both number but no answer.  ?

## 2021-09-24 DIAGNOSIS — M542 Cervicalgia: Secondary | ICD-10-CM | POA: Diagnosis not present

## 2021-09-24 DIAGNOSIS — S90851A Superficial foreign body, right foot, initial encounter: Secondary | ICD-10-CM | POA: Diagnosis not present

## 2021-09-26 ENCOUNTER — Telehealth: Payer: Self-pay

## 2021-09-26 ENCOUNTER — Ambulatory Visit: Payer: Medicare Other | Admitting: Cardiology

## 2021-09-26 ENCOUNTER — Encounter: Payer: Self-pay | Admitting: Internal Medicine

## 2021-09-26 NOTE — Telephone Encounter (Signed)
Patient states that sugar dropped 4 times yesterday numbers were 58, 78 ,68. Patient stopped all of her insulin yesterday.  I will print out report for review. ?

## 2021-09-26 NOTE — Telephone Encounter (Signed)
Patient took 38 units of the Lantus today. ?Patient was taking 4 units with each meal  until she stopped yesterday. ?Patient will contact us tomorrow with readings ?

## 2021-09-27 NOTE — Telephone Encounter (Signed)
09/26/21 ? ?Morning ? ?Manual- 265/ Freestyle-354 ? ?Lunch ?Manual-221/Freestyle-295 ? ?Supper ?Manual-196/Freestyle-231 ? ? ?09/27/21 ? ?Fasting ? ?Manual-143/Freestyle-180 ? ?Lunch ? ?Manual-160/Freestyle-192 ?

## 2021-10-03 DIAGNOSIS — Z20822 Contact with and (suspected) exposure to covid-19: Secondary | ICD-10-CM | POA: Diagnosis not present

## 2021-10-04 ENCOUNTER — Telehealth: Payer: Self-pay

## 2021-10-04 NOTE — Telephone Encounter (Signed)
LMTCB regarding sugar levels.  ?

## 2021-10-06 ENCOUNTER — Other Ambulatory Visit: Payer: Self-pay | Admitting: Family Medicine

## 2021-10-06 DIAGNOSIS — E782 Mixed hyperlipidemia: Secondary | ICD-10-CM

## 2021-10-06 DIAGNOSIS — I4821 Permanent atrial fibrillation: Secondary | ICD-10-CM

## 2021-10-06 DIAGNOSIS — I5032 Chronic diastolic (congestive) heart failure: Secondary | ICD-10-CM

## 2021-10-07 ENCOUNTER — Telehealth: Payer: Self-pay | Admitting: Family Medicine

## 2021-10-07 ENCOUNTER — Telehealth (HOSPITAL_COMMUNITY): Payer: Self-pay

## 2021-10-07 ENCOUNTER — Other Ambulatory Visit: Payer: Self-pay | Admitting: Family Medicine

## 2021-10-07 ENCOUNTER — Telehealth: Payer: Self-pay

## 2021-10-07 DIAGNOSIS — I4821 Permanent atrial fibrillation: Secondary | ICD-10-CM

## 2021-10-07 DIAGNOSIS — I5032 Chronic diastolic (congestive) heart failure: Secondary | ICD-10-CM

## 2021-10-07 DIAGNOSIS — E782 Mixed hyperlipidemia: Secondary | ICD-10-CM

## 2021-10-07 DIAGNOSIS — F251 Schizoaffective disorder, depressive type: Secondary | ICD-10-CM | POA: Diagnosis not present

## 2021-10-07 MED ORDER — ICOSAPENT ETHYL 1 G PO CAPS: ORAL_CAPSULE | ORAL | 0 refills | Status: DC

## 2021-10-07 NOTE — Telephone Encounter (Signed)
Please let the patient know that I sent their prescription to their pharmacy. Thanks, WS 

## 2021-10-07 NOTE — Telephone Encounter (Signed)
Submitted a Prior Authorization request to [employer] for Emgality '120MG'$ /ML auto-injectors via CoverMyMeds. Will update once we receive a response. ?  ?PA submitted on 10/07/2021 ?Key: [id number] ?Status is pending ?   ? ? ? ? ?

## 2021-10-07 NOTE — Telephone Encounter (Signed)
Patient Advocate Encounter ? ?Prior Authorization for Emgality '120MG'$ /ML auto-injectors  has been approved.   ? ?PA# [id number] ?Effective dates: 09/07/2021 through 06/01/2098 ? ? ? ? ? ?

## 2021-10-07 NOTE — Telephone Encounter (Signed)
Sent to PA team for Emglaity '120mg'$ /ml for authorization. ?

## 2021-10-07 NOTE — Telephone Encounter (Signed)
Patient aware.

## 2021-10-07 NOTE — Telephone Encounter (Signed)
Pt needs a 30 day supply of icosapent Ethyl (VASCEPA) 1 g capsule. She has 5 days left. Please send to [facility] [address] in [place]. The 90 days supply to mail order. ?

## 2021-10-08 DIAGNOSIS — Z20822 Contact with and (suspected) exposure to covid-19: Secondary | ICD-10-CM | POA: Diagnosis not present

## 2021-10-14 ENCOUNTER — Telehealth: Payer: Self-pay | Admitting: *Deleted

## 2021-10-14 NOTE — Telephone Encounter (Signed)
Mrs Renner called for a refill on her hydromorphone. Per the PMP last refill was 09/03/21. She has about 6 days left but will be out before Dr Dagoberto Ligas returns to office. ?

## 2021-10-15 DIAGNOSIS — M7741 Metatarsalgia, right foot: Secondary | ICD-10-CM | POA: Diagnosis not present

## 2021-10-15 DIAGNOSIS — M79671 Pain in right foot: Secondary | ICD-10-CM | POA: Diagnosis not present

## 2021-10-17 MED ORDER — HYDROMORPHONE HCL 2 MG PO TABS: 2.0000 mg | ORAL_TABLET | Freq: Four times a day (QID) | ORAL | 0 refills | Status: DC | PRN

## 2021-10-17 NOTE — Telephone Encounter (Signed)
PMP was Reviewed.  Dr Dagoberto Ligas note was reviewed.  Sybil RN will call Ms. Campoy Hydromorphone e-scribed today.

## 2021-10-18 ENCOUNTER — Ambulatory Visit: Payer: Self-pay | Admitting: *Deleted

## 2021-10-18 ENCOUNTER — Telehealth: Payer: Self-pay

## 2021-10-18 NOTE — Telephone Encounter (Signed)
  Chief Complaint: Took 2 Rybelsus pills this morning at 7:00 AM.   Wanting to know what she should do.   Left message with her PCP but hasn't heard back and her pharmacy doesn't open until 9:00.   Called in on the community line. Symptoms: No symptoms at this point Frequency: N/A Pertinent Negatives: Patient denies symptoms. Disposition: '[]'$ ED /'[]'$ Urgent Care (no appt availability in office) / '[]'$ Appointment(In office/virtual)/ '[]'$  [facility]/ '[]'$ Home Care/ '[]'$ Refused Recommended Disposition /'[]'$ [facility]/ '[]'$  Follow-up with PCP Additional Notes: I advised her to call the poison control center and gave her the National 1-800 number from the protocol.  Pt was agreeable to doing this and thanked me for my help.

## 2021-10-18 NOTE — Telephone Encounter (Signed)
Patient states that she took a extra dose of Rybelsus this morning and wants to know if she needs to be concerned

## 2021-10-18 NOTE — Telephone Encounter (Signed)
Patient notified and verbalized understanding. 

## 2021-10-18 NOTE — Telephone Encounter (Signed)
Reason for Disposition . [1] DOUBLE DOSE (an extra dose or lesser amount) of prescription drug AND [2] any symptoms (e.g., dizziness, nausea, pain, sleepiness)  Protocols used: Medication Question Call-A-AH

## 2021-10-25 ENCOUNTER — Encounter: Payer: Self-pay | Admitting: Physical Medicine and Rehabilitation

## 2021-10-25 ENCOUNTER — Encounter
Payer: Medicare Other | Attending: Physical Medicine and Rehabilitation | Admitting: Physical Medicine and Rehabilitation

## 2021-10-25 VITALS — Ht 62.0 in | Wt 188.4 lb

## 2021-10-25 DIAGNOSIS — M7918 Myalgia, other site: Secondary | ICD-10-CM | POA: Insufficient documentation

## 2021-10-25 DIAGNOSIS — G894 Chronic pain syndrome: Secondary | ICD-10-CM | POA: Insufficient documentation

## 2021-10-25 DIAGNOSIS — M5441 Lumbago with sciatica, right side: Secondary | ICD-10-CM | POA: Insufficient documentation

## 2021-10-25 DIAGNOSIS — Z5181 Encounter for therapeutic drug level monitoring: Secondary | ICD-10-CM | POA: Diagnosis not present

## 2021-10-25 DIAGNOSIS — G8929 Other chronic pain: Secondary | ICD-10-CM | POA: Diagnosis not present

## 2021-10-25 DIAGNOSIS — Z79891 Long term (current) use of opiate analgesic: Secondary | ICD-10-CM | POA: Insufficient documentation

## 2021-10-25 NOTE — Progress Notes (Signed)
Subjective:    Patient ID: Amber Stephenson, female    DOB: 05-02-1946, 76 y.o.   MRN: [id number]  HPI Patient is a 76 yr old female with hx of  CAD, pulmonary HTN- and DM- with CKD- Stage III here for right low back pain with sciatica  As well as upper back/neck pain/myofascial pain. Pt is here for chronic pain.   and trigger point injections for pain control   Changed to [place] for sleep, but hasn't helped.  Sleeping 2 hours/night most nights- Has been going on for 3 days.   Pain ~ 8/10-  Hurting more last few days as well since not sleeping-   Got referral to [facility] for Epidural steroid injections- had them- didn't work.  Lasted for 36 hours only- so didn't really work.    Migraines are doing better lately.   Got refill of pain meds last week-  So isn't due for pain meds.    Pain Inventory Average Pain 8 Pain Right Now 8 My pain is constant, sharp, burning, dull, stabbing, tingling, and aching  In the last 24 hours, has pain interfered with the following? General activity 10 Relation with others 0 Enjoyment of life 8 What TIME of day is your pain at its worst? night Sleep (in general) Poor  Pain is worse with: walking, bending, sitting, inactivity, standing, and some activites Pain improves with: rest, medication, TENS, and heat Relief from Meds: 5      Family History  Problem Relation Age of Onset   Alzheimer's disease Father    Diabetes Mother    Heart disease Mother    Stroke Brother    Heart disease Brother    Mental illness Brother    Diabetes Brother    Heart disease Sister        CABG   Diabetes Sister    Alcohol abuse Sister    Social History   Socioeconomic History   Marital status: Married    Spouse name: Orpah Greek   Number of children: 3   Years of education: 15   Highest education level: Bachelor's degree (e.g., BA, AB, BS)  Occupational History   Occupation: Retired    Comment: marketing  Tobacco Use   Smoking status: Never    Smokeless tobacco: Never  Vaping Use   Vaping Use: Never used  Substance and Sexual Activity   Alcohol use: Not Currently    Alcohol/week: 0.0 standard drinks   Drug use: No   Sexual activity: Yes    Partners: Male    Birth control/protection: Post-menopausal  Other Topics Concern   Not on file  Social History Narrative   Lives at home with husband.    They have 3 children who live away - [place], [place], and [place].   She is from [place] and most of her family lives there.   Her husbands's family live nearby   Right handed.   Social Determinants of Health   Financial Resource Strain: Low Risk    Difficulty of Paying Living Expenses: Not hard at all  Food Insecurity: No Food Insecurity   Worried About Charity fundraiser in the Last Year: Never true   [place] in the Last Year: Never true  Transportation Needs: No Transportation Needs   Lack of Transportation (Medical): No   Lack of Transportation (Non-Medical): No  Physical Activity: Inactive   Days of Exercise per Week: 0 days   Minutes of Exercise per Session: 0 min  Stress:  No Stress Concern Present   Feeling of Stress : Only a little  Social Connections: Engineer, building services of Communication with Friends and Family: More than three times a week   Frequency of Social Gatherings with Friends and Family: Twice a week   Attends Religious Services: More than 4 times per year   Active Member of [employer] or Organizations: Yes   Attends Archivist Meetings: 1 to 4 times per year   Marital Status: Married   Past Surgical History:  Procedure Laterality Date   BREAST REDUCTION SURGERY     EYE SURGERY Right    cateracts   HAMMER TOE SURGERY     LEFT HEART CATH AND CORONARY ANGIOGRAPHY N/A 02/03/2018   Procedure: LEFT HEART CATH AND CORONARY ANGIOGRAPHY;  Surgeon: Martinique, Peter M, MD;  Location: [facility];  Service: Cardiovascular;  Laterality: N/A;   REVERSE SHOULDER ARTHROPLASTY Right  08/19/2019   Procedure: REVERSE SHOULDER ARTHROPLASTY;  Surgeon: Netta Cedars, MD;  Location: [facility];  Service: Orthopedics;  Laterality: Right;  interscalene block   SHOULDER SURGERY Right    "I BROKE MY SHOUDLER   THIGH SURGERY     "TO REMOVE A TUMOR "   Past Medical History:  Diagnosis Date   Acute on chronic respiratory failure with hypoxia ([place]) 01/22/2019   Asthma    Atrial fibrillation with RVR 05/19/2017   Bipolar I disorder 01/23/2015   CAD (coronary artery disease)    Nonobstructive; Managed by Dr. Bronson Ing   Cardiomegaly 01/12/2018   Chronic anticoagulation 05/30/2015   Chronic atrial fibrillation ([place]) 01/04/2015   Chronic back pain    Lower back   Chronic diastolic CHF (congestive heart failure) 05/30/2015   Diabetes mellitus, type 2, without complication    Diabetic neuropathy 02/06/2016   Dysrhythmia    A-Fib   Essential hypertension    Herpes genitalis in women 07/16/2015   Hyperlipidemia    Hypoxia 02/01/2019   Insomnia 01/23/2015   Lipoma 02/08/2015   Major depressive disorder    Migraine headache with aura 02/12/2016   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   NAFL (nonalcoholic fatty liver) 16/03/9603   OSA (obstructive sleep apnea) 02/24/2019   10/09/2018 - HST  - AHI 40.6    Pulmonary hypertension    Renal insufficiency    Managed by Dr. Wallace Keller   RLS (restless legs syndrome) 04/27/2015   Ht '5\' 2"'$  (1.575 m)   Wt 188 lb 6.4 oz (85.5 kg)   BMI 34.46 kg/m   Opioid Risk Score:   Fall Risk Score:  `1  Depression screen [facility] 2/9     07/19/2021    2:51 PM 07/01/2021    3:50 PM 06/18/2021    3:33 PM 05/28/2021   11:38 AM 05/03/2021    2:47 PM 04/16/2021   11:10 AM 04/15/2021    1:52 PM  Depression screen PHQ 2/9  Decreased Interest 0 0 3 0 0 0 0  Down, Depressed, Hopeless 0 0 0 0 0 1 1  PHQ - 2 Score 0 0 3 0 0 1 1  Altered sleeping   0  0 0   Tired, decreased energy   3  0 0   Change in appetite   3  0 0   Feeling bad or failure  about yourself    0  0 0   Trouble concentrating   0  0 0   Moving slowly or fidgety/restless   0  0 0  Suicidal thoughts   0  0 0   PHQ-9 Score   9  0 1   Difficult doing work/chores     Not difficult at all Not difficult at all     Review of Systems  Musculoskeletal:  Positive for back pain and neck pain.  All other systems reviewed and are negative.     Objective:   Physical Exam  Awake, alert, appears in a LOT of pain today; coughing some; accompanied by husband, NAD Trigger points in upper neck/back and low back on R side      Assessment & Plan:   Patient is a 76 yr old female with hx of  CAD, pulmonary HTN- and DM- with CKD- Stage III here for right low back pain with sciatica  As well as upper back/neck pain/myofascial pain. Pt is here for chronic pain and trigger point injections.   here for Trigger point injections-    Last refill of Dilaudid last week- and isn't due at this time- but continue Dilaudid as prescribed.   2. Patient here for trigger point injections for  Consent done and on chart.  Cleaned areas with alcohol and injected using a 27 gauge 1.5 inch needle  Injected 6cc Using 1% Lidocaine with no EPI  Upper traps B/L  Levators- B/L  Posterior scalenes Middle scalenes- B/L  Splenius Capitus- B/L  Pectoralis Major Rhomboids Infraspinatus Teres Major/minor Thoracic paraspinals Lumbar paraspinals- R side only- x4  Other injections-    Patient's level of pain prior was 8/10-  Current level of pain after injections is- 8/10- usually takes some time to improve after her injections.   There was no bleeding or complications.  Patient was advised to drink a lot of water on day after injections to flush system Will have increased soreness for 12-48 hours after injections.  Can use Lidocaine patches the day AFTER injections Can use theracane on day of injections in places didn't inject Can use heating pad 4-6 hours AFTER injections   3. F/U 2  months-for trigger point injections. Will make multiple appointments-   4. UDS today

## 2021-10-25 NOTE — Patient Instructions (Signed)
Patient is a 76 yr old female with hx of  CAD, pulmonary HTN- and DM- with CKD- Stage III here for right low back pain with sciatica  As well as upper back/neck pain/myofascial pain. Pt is here for chronic pain and trigger point injections.   here for Trigger point injections-    Last refill of Dilaudid last week- and isn't due at this time- but continue Dilaudid as prescribed.   2. Patient here for trigger point injections for  Consent done and on chart.  Cleaned areas with alcohol and injected using a 27 gauge 1.5 inch needle  Injected 6cc Using 1% Lidocaine with no EPI  Upper traps B/L  Levators- B/L  Posterior scalenes Middle scalenes- B/L  Splenius Capitus- B/L  Pectoralis Major Rhomboids Infraspinatus Teres Major/minor Thoracic paraspinals Lumbar paraspinals- R side only- x4  Other injections-    Patient's level of pain prior was 8/10-  Current level of pain after injections is- 8/10- usually takes some time to improve after her injections.   There was no bleeding or complications.  Patient was advised to drink a lot of water on day after injections to flush system Will have increased soreness for 12-48 hours after injections.  Can use Lidocaine patches the day AFTER injections Can use theracane on day of injections in places didn't inject Can use heating pad 4-6 hours AFTER injections   3. F/U 2 months-for trigger point injections. Will make multiple appointments-

## 2021-10-26 ENCOUNTER — Other Ambulatory Visit: Payer: Self-pay | Admitting: Neurology

## 2021-10-30 ENCOUNTER — Telehealth: Payer: Self-pay | Admitting: Physician Assistant

## 2021-10-30 DIAGNOSIS — F251 Schizoaffective disorder, depressive type: Secondary | ICD-10-CM | POA: Diagnosis not present

## 2021-10-30 NOTE — Telephone Encounter (Signed)
Dr. Casimiro Needle called and left a voice mail that he'd like to speak with someone about this patient, no other details left.   I returned his call and left a voice mail to call back.

## 2021-10-30 NOTE — Telephone Encounter (Signed)
Dr. Donneta Romberg called again to speak with Clarise Cruz about this patient. He said, "It is too complicated to go in to."

## 2021-10-31 ENCOUNTER — Encounter (HOSPITAL_COMMUNITY): Payer: Self-pay | Admitting: Physical Therapy

## 2021-10-31 ENCOUNTER — Ambulatory Visit (HOSPITAL_COMMUNITY): Payer: Medicare Other | Attending: Neurosurgery | Admitting: Physical Therapy

## 2021-10-31 DIAGNOSIS — M545 Low back pain, unspecified: Secondary | ICD-10-CM | POA: Diagnosis not present

## 2021-10-31 DIAGNOSIS — G8929 Other chronic pain: Secondary | ICD-10-CM | POA: Insufficient documentation

## 2021-10-31 DIAGNOSIS — R293 Abnormal posture: Secondary | ICD-10-CM | POA: Insufficient documentation

## 2021-10-31 DIAGNOSIS — M542 Cervicalgia: Secondary | ICD-10-CM | POA: Diagnosis not present

## 2021-10-31 DIAGNOSIS — R2689 Other abnormalities of gait and mobility: Secondary | ICD-10-CM | POA: Diagnosis not present

## 2021-10-31 DIAGNOSIS — M6281 Muscle weakness (generalized): Secondary | ICD-10-CM | POA: Diagnosis not present

## 2021-10-31 LAB — TOXASSURE SELECT,+ANTIDEPR,UR

## 2021-10-31 NOTE — Therapy (Signed)
OUTPATIENT PHYSICAL THERAPY CERVICAL EVALUATION   Patient Name: Amber Stephenson MRN: [id number] DOB:04-09-1946, 76 y.o., female Today's Date: 10/31/2021   PT End of Session - 10/31/21 1509     Visit Number 1    Number of Visits 12    Date for PT Re-Evaluation 12/12/21    Authorization Type Medicare A; [employer]    Progress Note Due on Visit 10    PT Start Time 1425    PT Stop Time 1510    PT Time Calculation (min) 45 min    Activity Tolerance Patient tolerated treatment well    Behavior During Therapy [name] for tasks assessed/performed             Past Medical History:  Diagnosis Date   Acute on chronic respiratory failure with hypoxia ([place]) 01/22/2019   Asthma    Atrial fibrillation with RVR 05/19/2017   Bipolar I disorder 01/23/2015   CAD (coronary artery disease)    Nonobstructive; Managed by Dr. 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 Wallace Keller   RLS (restless legs syndrome) 04/27/2015   Past Surgical History:  Procedure Laterality Date   BREAST REDUCTION SURGERY     EYE SURGERY Right    cateracts   HAMMER TOE SURGERY     LEFT HEART CATH AND CORONARY ANGIOGRAPHY N/A 02/03/2018   Procedure: LEFT HEART CATH AND CORONARY  ANGIOGRAPHY;  Surgeon: Martinique, Peter M, MD;  Location: [place] [facility];  Service: Cardiovascular;  Laterality: N/A;   REVERSE SHOULDER ARTHROPLASTY Right 08/19/2019   Procedure: REVERSE SHOULDER ARTHROPLASTY;  Surgeon: Netta Cedars, MD;  Location: [facility];  Service: Orthopedics;  Laterality: Right;  interscalene block   SHOULDER SURGERY Right    "I BROKE MY SHOUDLER   THIGH SURGERY     "TO REMOVE A TUMOR "   Patient Active Problem List   Diagnosis Date Noted   Myofascial pain 10/25/2021   Superficial fungus infection of skin 09/03/2021   Chronic right-sided low back pain with right-sided sciatica 07/19/2021   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   Vaginal itching 04/16/2021   Dysuria 04/16/2021   Yeast infection 01/14/2021   Type 2 diabetes mellitus without complication, with long-term current use of insulin ([place]) 12/25/2020   Acute on chronic diastolic CHF (congestive heart failure)/HFpEF Exacerbation 12/16/2020   Acute respiratory failure with hypoxia ([place]) 12/16/2020   UTI (urinary tract infection) 12/14/2020   Elevated brain natriuretic peptide (BNP) level 12/14/2020   Elevated troponin 12/14/2020   Acute exacerbation of CHF (congestive heart failure) ([place]) 12/13/2020   Chronic post-traumatic stress disorder 12/06/2020   Bipolar I disorder, most recent episode (or current) manic ([place]) 12/06/2020   Tremor 12/06/2020  Senile nuclear sclerosis 12/06/2020   Senile corneal changes 12/06/2020   Psychoses ([place]) 12/06/2020   Presbyopia 12/06/2020   Postmenopausal bleeding 12/06/2020   Postcoital bleeding 12/06/2020   Pinguecula 12/06/2020   Pain in joint, shoulder region 12/06/2020   Other psychological or physical stress 12/06/2020   Other acquired hammer toe 12/06/2020   Osteopenia 12/06/2020   Opioid dependence ([place]) 12/06/2020   Non-neoplastic nevus 12/06/2020   Essential hypertriglyceridemia 12/06/2020   Hypermetropia 12/06/2020   Debility 12/06/2020   Cystitis  12/06/2020   Depressed bipolar I disorder in full remission ([place]) 12/06/2020   Benzodiazepine dependence ([place]) 12/06/2020   Benign neoplasm of skin 12/06/2020   Anxiety 12/06/2020   Functional diarrhea 10/26/2020   Diabetes mellitus due to underlying condition with hyperglycemia, with long-term current use of insulin ([place]) 09/24/2020   Dyslipidemia 09/24/2020   Head trauma 09/17/2020   Chronic respiratory failure with hypoxia ([place]) 04/25/2020   Chronic constipation 04/25/2020   Back pain/sacroiliitis--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   Fall at home, initial encounter 04/23/2020   Ambulatory dysfunction--back pain and falls 04/23/2020   Chronic pain syndrome 08/22/2019   Nerve pain 08/22/2019   Myofascial pain dysfunction syndrome 08/22/2019   Dyspnea 08/22/2019   CAD (coronary artery disease)    Hypocalcemia    S/P shoulder replacement, 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   History of adenomatous polyp of colon 05/21/2019   Polypharmacy 05/21/2019   Benign paroxysmal positional vertigo due to bilateral vestibular disorder 05/20/2019   Hyperlipidemia associated with type 2 diabetes mellitus ([place]) 05/20/2019   Vertigo 04/25/2019   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   OSA (obstructive sleep apnea) 02/24/2019   Allergic rhinitis 02/24/2019   Acute on chronic respiratory failure with hypoxia ([place]) 01/22/2019   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   Morbid obesity with alveolar hypoventilation ([place]) 07/06/2018   Obesity (BMI 30-39.9) 07/06/2018   At risk for adverse drug event 07/06/2018   Cardiomegaly 83/41/9622   Non-alcoholic fatty liver disease 01/12/2018   Mild intermittent asthma 01/12/2018   Senile osteoporosis 12/15/2017   Atrial fibrillation with RVR ([place]) 05/19/2017   Fibromyalgia 03/19/2017   Migraine headache with aura 02/12/2016   Diabetic neuropathy associated with type 2 diabetes mellitus ([place]) 02/06/2016    Bipolar 1 disorder, manic, moderate ([place]) 10/04/2015   Depression, recurrent ([place]) 10/03/2015   Herpes genitalis in women 07/16/2015   Long term current use of anticoagulant 05/30/2015   Family history of coronary arteriosclerosis 05/30/2015   Chronic diastolic heart failure ([place]) 05/30/2015   Dyspnea on exertion    Essential hypertension    Restless legs 04/27/2015   Lipoma 02/08/2015   Bipolar disorder ([place]) 01/23/2015   Insomnia 01/23/2015   Chronic back pain 01/04/2015   Chronic atrial fibrillation ([place]) 01/04/2015   Type 2 diabetes mellitus ([place])    Hyperlipidemia     PCP: [name] MD   REFERRING PROVIDER: Kary Kos, MD   REFERRING DIAG: M54.2 (ICD-10-CM) - Neck pain   THERAPY DIAG:  Cervicalgia  Abnormal posture  Rationale for Evaluation and Treatment Rehabilitation  ONSET DATE: Chronic  SUBJECTIVE:  SUBJECTIVE STATEMENT: Patient presents to therapy with complaint of chronic neck pain. This has been ongoing for several years, worsening over the past 2 years. No unknown mechanism of injury. She is seeing pain management. Notes chronic low back pain as well. She is currently managing symptoms with prescribed pain medication. No prior therapy for this issue. She has tried acupuncture with good result.   PERTINENT HISTORY:  Chronic LBP, Stage 3 Kidney disease, RT TSA, DM   PAIN:  Are you having pain? Yes: NPRS scale: 8/10 Pain location: neck, upper back Pain description: "hard pain" like breaking bones Aggravating factors: sleeping  Relieving factors: stretching, meds   PRECAUTIONS: None  WEIGHT BEARING RESTRICTIONS No  FALLS:  Has patient fallen in last 6 months? No  LIVING ENVIRONMENT: Lives with: lives with their spouse Lives in:  House/apartment  OCCUPATION: Retired   PLOF: Independent with basic ADLs  PATIENT GOALS "Have less pain"  OBJECTIVE:   DIAGNOSTIC FINDINGS:  IMPRESSION: 1. Motion degraded exam. Overall grossly stable appearance of the cervical spine as compared to 04/23/2020. 2. Multilevel cervical spondylosis with small central/paracentral protrusions at C4-5 and C5-6. Secondary mild flattening of the ventral cord at these levels without cord signal changes. 3. Mild right C4 and left C5 foraminal stenosis, with mild to moderate left C7 foraminal narrowing, stable.    PATIENT SURVEYS:  FOTO 38% function    COGNITION: Overall cognitive status: Within functional limits for tasks assessed   POSTURE: rounded shoulders and forward head  PALPATION:   Moderate TTP about bilateral upper trap    CERVICAL ROM:   Active ROM A/PROM (deg) eval  Flexion 50  Extension 37  Right lateral flexion   Left lateral flexion   Right rotation 42  Left rotation 42   (Blank rows = not tested)  UPPER EXTREMITY ROM:  Mod restriction in RT shoulder elevation, min restriction in LT shoulder elevation   UPPER EXTREMITY MMT:  MMT Right eval Left eval  Shoulder flexion    Shoulder extension    Shoulder abduction    Shoulder adduction    Shoulder extension    Shoulder internal rotation    Shoulder external rotation    Middle trapezius    Lower trapezius    Elbow flexion    Elbow extension    Wrist flexion    Wrist extension    Wrist ulnar deviation    Wrist radial deviation    Wrist pronation    Wrist supination    Grip strength     (Blank rows = not tested)   TODAY'S TREATMENT:  10/31/21 Scap retraction Chic tuck Upper trap stretch    PATIENT EDUCATION:  Education details: on eval findings, POC and HEP  Person educated: Patient Education method: Explanation Education comprehension: verbalized understanding   HOME EXERCISE PROGRAM: Access Code: [email or web address] URL:  [email or web address] Date: 10/31/2021 Prepared by: Josue Hector  Exercises - Seated Scapular Retraction  - 3 x daily - 7 x weekly - 2 sets - 10 reps - 3 second hold - Seated Cervical Retraction  - 3 x daily - 7 x weekly - 2 sets - 10 reps - 3 second hold - Seated Upper Trapezius Stretch  - 3 x daily - 7 x weekly - 1 sets - 3 reps - 30 second hold  ASSESSMENT:  CLINICAL IMPRESSION: Patient is a 76 y.o. female who presents to physical therapy with complaint of neck pain. Patient demonstrates decreased strength, ROM restriction, reduced flexibility, increased tenderness to palpation and  postural abnormalities which are likely contributing to symptoms of pain and are negatively impacting patient ability to perform ADLs. Patient will benefit from skilled physical therapy services to address these deficits to reduce pain and improve level of function with ADLs    OBJECTIVE IMPAIRMENTS decreased activity tolerance, decreased mobility, decreased ROM, decreased strength, hypomobility, increased fascial restrictions, increased muscle spasms, impaired flexibility, impaired UE functional use, improper body mechanics, postural dysfunction, and pain.   ACTIVITY LIMITATIONS carrying, lifting, sleeping, bathing, dressing, reach over head, hygiene/grooming, and locomotion level  PARTICIPATION LIMITATIONS: cleaning, laundry, driving, shopping, community activity, and yard work  PERSONAL FACTORS  Chronic pain Hx  are also affecting patient's functional outcome.   REHAB POTENTIAL: Good  CLINICAL DECISION MAKING: Stable/uncomplicated  EVALUATION COMPLEXITY: Low   GOALS: SHORT TERM GOALS: Target date: 11/21/2021  Patient will be independent with initial HEP and self-management strategies to improve functional outcomes Baseline:  Goal status: INITIAL    LONG TERM GOALS: Target date: 12/12/2021  Patient will be independent with advanced HEP and self-management strategies to improve  functional outcomes Baseline:  Goal status: INITIAL  2.  Patient will improve FOTO score to predicted value to indicate improvement in functional outcomes Baseline: 38%  Goal status: INITIAL  3.  Patient will demo improved bilateral cervical rotation by 10 degrees in order to improve ability to scan environment for safety and while driving. Baseline: See AROM  Goal status: INITIAL  4. Patient will report a decrease in neck pain to no more than 3/10 for improved quality of life and ability to perform UE ADLs  Baseline: 8/10 Goal status: INITIAL   PLAN: PT FREQUENCY: 2x/week  PT DURATION: 6 weeks  PLANNED INTERVENTIONS: Therapeutic exercises, Therapeutic activity, Neuromuscular re-education, Balance training, Gait training, Patient/Family education, Joint manipulation, Joint mobilization, Stair training, Aquatic Therapy, Dry Needling, Electrical stimulation, Spinal manipulation, Spinal mobilization, Cryotherapy, Moist heat, scar mobilization, Taping, Traction, Ultrasound, Biofeedback, Ionotophoresis '4mg'$ /ml Dexamethasone, and Manual therapy.   PLAN FOR NEXT SESSION: Progress postural strength, improve cervical and shoulder AROM.   3:09 PM, 10/31/21 Josue Hector PT DPT  Physical Therapist with [facility]  [phone]

## 2021-11-01 ENCOUNTER — Telehealth: Payer: Self-pay | Admitting: *Deleted

## 2021-11-01 NOTE — Telephone Encounter (Signed)
Urine drug screen for this encounter is consistent for prescribed controlled medication.

## 2021-11-04 ENCOUNTER — Encounter (HOSPITAL_COMMUNITY): Payer: Self-pay

## 2021-11-04 ENCOUNTER — Ambulatory Visit (HOSPITAL_COMMUNITY): Payer: Medicare Other | Attending: Neurosurgery

## 2021-11-04 DIAGNOSIS — E1122 Type 2 diabetes mellitus with diabetic chronic kidney disease: Secondary | ICD-10-CM | POA: Diagnosis not present

## 2021-11-04 DIAGNOSIS — R809 Proteinuria, unspecified: Secondary | ICD-10-CM | POA: Diagnosis not present

## 2021-11-04 DIAGNOSIS — R293 Abnormal posture: Secondary | ICD-10-CM | POA: Insufficient documentation

## 2021-11-04 DIAGNOSIS — M542 Cervicalgia: Secondary | ICD-10-CM | POA: Insufficient documentation

## 2021-11-04 DIAGNOSIS — Z79899 Other long term (current) drug therapy: Secondary | ICD-10-CM | POA: Diagnosis not present

## 2021-11-04 DIAGNOSIS — E6609 Other obesity due to excess calories: Secondary | ICD-10-CM | POA: Diagnosis not present

## 2021-11-04 DIAGNOSIS — I129 Hypertensive chronic kidney disease with stage 1 through stage 4 chronic kidney disease, or unspecified chronic kidney disease: Secondary | ICD-10-CM | POA: Diagnosis not present

## 2021-11-04 DIAGNOSIS — E211 Secondary hyperparathyroidism, not elsewhere classified: Secondary | ICD-10-CM | POA: Diagnosis not present

## 2021-11-04 DIAGNOSIS — N189 Chronic kidney disease, unspecified: Secondary | ICD-10-CM | POA: Diagnosis not present

## 2021-11-04 DIAGNOSIS — E1129 Type 2 diabetes mellitus with other diabetic kidney complication: Secondary | ICD-10-CM | POA: Diagnosis not present

## 2021-11-04 DIAGNOSIS — D751 Secondary polycythemia: Secondary | ICD-10-CM | POA: Diagnosis not present

## 2021-11-04 NOTE — Therapy (Signed)
OUTPATIENT PHYSICAL THERAPY CERVICAL EVALUATION   Patient Name: Amber Stephenson MRN: [id number] DOB:December 13, 1945, 76 y.o., female Today's Date: 11/04/2021   PT End of Session - 11/04/21 1429     Visit Number 2    Number of Visits 12    Date for PT Re-Evaluation 12/12/21    Authorization Type Medicare A; [employer]    Progress Note Due on Visit 10    PT Start Time 1430    PT Stop Time 1515    PT Time Calculation (min) 45 min    Activity Tolerance Patient tolerated treatment well    Behavior During Therapy [facility] for tasks assessed/performed             Past Medical History:  Diagnosis Date   Acute on chronic respiratory failure with hypoxia ([place]) 01/22/2019   Asthma    Atrial fibrillation with RVR 05/19/2017   Bipolar I disorder 01/23/2015   CAD (coronary artery disease)    Nonobstructive; Managed by Dr. Bronson Ing   Cardiomegaly 01/12/2018   Chronic anticoagulation 05/30/2015   Chronic atrial fibrillation ([place]) 01/04/2015   Chronic back pain    Lower back   Chronic diastolic CHF (congestive heart failure) 05/30/2015   Diabetes mellitus, type 2, without complication    Diabetic neuropathy 02/06/2016   Dysrhythmia    A-Fib   Essential hypertension    Herpes genitalis in women 07/16/2015   Hyperlipidemia    Hypoxia 02/01/2019   Insomnia 01/23/2015   Lipoma 02/08/2015   Major depressive disorder    Migraine headache with aura 02/12/2016   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   NAFL (nonalcoholic fatty liver) 36/62/9476   OSA (obstructive sleep apnea) 02/24/2019   10/09/2018 - HST  - AHI 40.6    Pulmonary hypertension    Renal insufficiency    Managed by Dr. Wallace Keller   RLS (restless legs syndrome) 04/27/2015   Past Surgical History:  Procedure Laterality Date   BREAST REDUCTION SURGERY     EYE SURGERY Right    cateracts   HAMMER TOE SURGERY     LEFT HEART CATH AND CORONARY ANGIOGRAPHY N/A 02/03/2018   Procedure: LEFT HEART CATH AND CORONARY  ANGIOGRAPHY;  Surgeon: Martinique, Peter M, MD;  Location: [place] [facility];  Service: Cardiovascular;  Laterality: N/A;   REVERSE SHOULDER ARTHROPLASTY Right 08/19/2019   Procedure: REVERSE SHOULDER ARTHROPLASTY;  Surgeon: Netta Cedars, MD;  Location: [facility];  Service: Orthopedics;  Laterality: Right;  interscalene block   SHOULDER SURGERY Right    "I BROKE MY SHOUDLER   THIGH SURGERY     "TO REMOVE A TUMOR "   Patient Active Problem List   Diagnosis Date Noted   Myofascial pain 10/25/2021   Superficial fungus infection of skin 09/03/2021   Chronic right-sided low back pain with right-sided sciatica 07/19/2021   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   Vaginal itching 04/16/2021   Dysuria 04/16/2021   Yeast infection 01/14/2021   Type 2 diabetes mellitus without complication, with long-term current use of insulin ([place]) 12/25/2020   Acute on chronic diastolic CHF (congestive heart failure)/HFpEF Exacerbation 12/16/2020   Acute respiratory failure with hypoxia ([place]) 12/16/2020   UTI (urinary tract infection) 12/14/2020   Elevated brain natriuretic peptide (BNP) level 12/14/2020   Elevated troponin 12/14/2020   Acute exacerbation of CHF (congestive heart failure) ([place]) 12/13/2020   Chronic post-traumatic stress disorder 12/06/2020   Bipolar I disorder, most recent episode (or current) manic ([place]) 12/06/2020   Tremor 12/06/2020  Senile nuclear sclerosis 12/06/2020   Senile corneal changes 12/06/2020   Psychoses ([place]) 12/06/2020   Presbyopia 12/06/2020   Postmenopausal bleeding 12/06/2020   Postcoital bleeding 12/06/2020   Pinguecula 12/06/2020   Pain in joint, shoulder region 12/06/2020   Other psychological or physical stress 12/06/2020   Other acquired hammer toe 12/06/2020   Osteopenia 12/06/2020   Opioid dependence ([place]) 12/06/2020   Non-neoplastic nevus 12/06/2020   Essential hypertriglyceridemia 12/06/2020   Hypermetropia 12/06/2020   Debility 12/06/2020   Cystitis  12/06/2020   Depressed bipolar I disorder in full remission ([place]) 12/06/2020   Benzodiazepine dependence ([place]) 12/06/2020   Benign neoplasm of skin 12/06/2020   Anxiety 12/06/2020   Functional diarrhea 10/26/2020   Diabetes mellitus due to underlying condition with hyperglycemia, with long-term current use of insulin ([place]) 09/24/2020   Dyslipidemia 09/24/2020   Head trauma 09/17/2020   Chronic respiratory failure with hypoxia ([place]) 04/25/2020   Chronic constipation 04/25/2020   Back pain/sacroiliitis--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   Fall at home, initial encounter 04/23/2020   Ambulatory dysfunction--back pain and falls 04/23/2020   Chronic pain syndrome 08/22/2019   Nerve pain 08/22/2019   Myofascial pain dysfunction syndrome 08/22/2019   Dyspnea 08/22/2019   CAD (coronary artery disease)    Hypocalcemia    S/P shoulder replacement, 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   History of adenomatous polyp of colon 05/21/2019   Polypharmacy 05/21/2019   Benign paroxysmal positional vertigo due to bilateral vestibular disorder 05/20/2019   Hyperlipidemia associated with type 2 diabetes mellitus ([place]) 05/20/2019   Vertigo 04/25/2019   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   OSA (obstructive sleep apnea) 02/24/2019   Allergic rhinitis 02/24/2019   Acute on chronic respiratory failure with hypoxia ([place]) 01/22/2019   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   Morbid obesity with alveolar hypoventilation ([place]) 07/06/2018   Obesity (BMI 30-39.9) 07/06/2018   At risk for adverse drug event 07/06/2018   Cardiomegaly 08/14/9456   Non-alcoholic fatty liver disease 01/12/2018   Mild intermittent asthma 01/12/2018   Senile osteoporosis 12/15/2017   Atrial fibrillation with RVR ([place]) 05/19/2017   Fibromyalgia 03/19/2017   Migraine headache with aura 02/12/2016   Diabetic neuropathy associated with type 2 diabetes mellitus ([place]) 02/06/2016    Bipolar 1 disorder, manic, moderate ([place]) 10/04/2015   Depression, recurrent ([place]) 10/03/2015   Herpes genitalis in women 07/16/2015   Long term current use of anticoagulant 05/30/2015   Family history of coronary arteriosclerosis 05/30/2015   Chronic diastolic heart failure ([place]) 05/30/2015   Dyspnea on exertion    Essential hypertension    Restless legs 04/27/2015   Lipoma 02/08/2015   Bipolar disorder ([place]) 01/23/2015   Insomnia 01/23/2015   Chronic back pain 01/04/2015   Chronic atrial fibrillation ([place]) 01/04/2015   Type 2 diabetes mellitus ([place])    Hyperlipidemia     PCP: [name] MD   REFERRING PROVIDER: Kary Kos, MD   REFERRING DIAG: M54.2 (ICD-10-CM) - Neck pain   THERAPY DIAG:  Cervicalgia  Abnormal posture  Rationale for Evaluation and Treatment Rehabilitation  ONSET DATE: Chronic  SUBJECTIVE:  SUBJECTIVE STATEMENT: pt reports that she is having a lot of pain in neck. Pt reports she has been very busy and has limitedly gotten to HEP. Pt gets up from waiting room stating she is in a lot of back pain today.   PERTINENT HISTORY:  -Chronic LBP, Stage 3 Kidney disease, RT TSA, DM -Patient presents to therapy with complaint of chronic neck pain. This has been ongoing for several years, worsening over the past 2 years. No unknown mechanism of injury. She is seeing pain management. Notes chronic low back pain as well. She is currently managing symptoms with prescribed pain medication. No prior therapy for this issue. She has tried acupuncture with good result.    PAIN:  Are you having pain? Yes: NPRS scale: 7/10 Pain location: neck, upper back Pain description: "hard pain" like breaking bones Aggravating factors: sleeping  Relieving factors: stretching, meds    PRECAUTIONS: None  WEIGHT BEARING RESTRICTIONS No  FALLS:  Has patient fallen in last 6 months? No  LIVING ENVIRONMENT: Lives with: lives with their spouse Lives in: House/apartment  OCCUPATION: Retired   PLOF: Independent with basic ADLs  PATIENT GOALS "Have less pain"  OBJECTIVE:   DIAGNOSTIC FINDINGS:  IMPRESSION: 1. Motion degraded exam. Overall grossly stable appearance of the cervical spine as compared to 04/23/2020. 2. Multilevel cervical spondylosis with small central/paracentral protrusions at C4-5 and C5-6. Secondary mild flattening of the ventral cord at these levels without cord signal changes. 3. Mild right C4 and left C5 foraminal stenosis, with mild to moderate left C7 foraminal narrowing, stable.    PATIENT SURVEYS:  FOTO 38% function    COGNITION: Overall cognitive status: Within functional limits for tasks assessed   POSTURE: rounded shoulders and forward head  PALPATION:   Moderate TTP about bilateral upper trap    CERVICAL ROM:   Active ROM A/PROM (deg) eval  Flexion 50  Extension 37  Right lateral flexion   Left lateral flexion   Right rotation 42  Left rotation 42   (Blank rows = not tested)  UPPER EXTREMITY ROM:  Mod restriction in RT shoulder elevation, min restriction in LT shoulder elevation   UPPER EXTREMITY MMT:  MMT Right eval Left eval  Shoulder flexion    Shoulder extension    Shoulder abduction    Shoulder adduction    Shoulder extension    Shoulder internal rotation    Shoulder external rotation    Middle trapezius    Lower trapezius    Elbow flexion    Elbow extension    Wrist flexion    Wrist extension    Wrist ulnar deviation    Wrist radial deviation    Wrist pronation    Wrist supination    Grip strength     (Blank rows = not tested)   TODAY'S TREATMENT:   11/04/21  Upper trap stretch b/l 10 sec holds x3 b/l   Serratus stretch b/l 10 sec holds x3 b/l   Scap rolls in seated x10  Chin tuck  x15  STM to sub occipital and upper trap region  Mob to spinous process C4 with left lateral flexion following mulligan concept  Self SNAG for cervical extension and rotation following mulligan concept.     10/31/21 Scap retraction Chic tuck Upper trap stretch    PATIENT EDUCATION:  Education details:HEP updated 6/5 Person educated: Patient Education method: Explanation Education comprehension: verbalized understanding   HOME EXERCISE PROGRAM:   Access Code: [email or web address] URL: [email or web address] Date: 11/04/2021 Prepared  by: Leota Jacobsen  Exercises - Seated Levator Scapulae Stretch  - 2 x daily - 7 x weekly - 1 sets - 3 reps - 10sec hold - Shoulder Rolls in Sitting  - 2 x daily - 7 x weekly - 2 sets - 10 reps - Seated Assisted Cervical Rotation with Towel  - 2 x daily - 7 x weekly - 1 sets - 3 reps - Upper Cervical Extension SNAG with Strap  - 2 x daily - 7 x weekly - 1 sets - 5 reps  Access Code: [email or web address] URL: [email or web address] Date: 10/31/2021 Prepared by: Josue Hector  Exercises - Seated Scapular Retraction  - 3 x daily - 7 x weekly - 2 sets - 10 reps - 3 second hold - Seated Cervical Retraction  - 3 x daily - 7 x weekly - 2 sets - 10 reps - 3 second hold - Seated Upper Trapezius Stretch  - 3 x daily - 7 x weekly - 1 sets - 3 reps - 30 second hold  ASSESSMENT:  CLINICAL IMPRESSION: Patient reports left side neck pain when coming back up from right side upper trap stretch. Patient hypersensitive to light touch at and around shoulder region but reports improvements with STM to neck. HEP updated on today's session to include self snags.  Patient will continue to benefit from skilled PT services to decrease pain and improve neck mobility.    OBJECTIVE IMPAIRMENTS decreased activity tolerance, decreased mobility, decreased ROM, decreased strength, hypomobility, increased fascial restrictions, increased muscle spasms, impaired  flexibility, impaired UE functional use, improper body mechanics, postural dysfunction, and pain.   ACTIVITY LIMITATIONS carrying, lifting, sleeping, bathing, dressing, reach over head, hygiene/grooming, and locomotion level  PARTICIPATION LIMITATIONS: cleaning, laundry, driving, shopping, community activity, and yard work  PERSONAL FACTORS  Chronic pain Hx  are also affecting patient's functional outcome.   REHAB POTENTIAL: Good  CLINICAL DECISION MAKING: Stable/uncomplicated  EVALUATION COMPLEXITY: Low   GOALS: SHORT TERM GOALS: Target date: 11/21/2021  Patient will be independent with initial HEP and self-management strategies to improve functional outcomes Baseline:  Goal status: INITIAL    LONG TERM GOALS: Target date: 12/12/2021  Patient will be independent with advanced HEP and self-management strategies to improve functional outcomes Baseline:  Goal status: INITIAL  2.  Patient will improve FOTO score to predicted value to indicate improvement in functional outcomes Baseline: 38%  Goal status: INITIAL  3.  Patient will demo improved bilateral cervical rotation by 10 degrees in order to improve ability to scan environment for safety and while driving. Baseline: See AROM  Goal status: INITIAL  4. Patient will report a decrease in neck pain to no more than 3/10 for improved quality of life and ability to perform UE ADLs  Baseline: 8/10 Goal status: INITIAL   PLAN: PT FREQUENCY: 2x/week  PT DURATION: 6 weeks  PLANNED INTERVENTIONS: Therapeutic exercises, Therapeutic activity, Neuromuscular re-education, Balance training, Gait training, Patient/Family education, Joint manipulation, Joint mobilization, Stair training, Aquatic Therapy, Dry Needling, Electrical stimulation, Spinal manipulation, Spinal mobilization, Cryotherapy, Moist heat, scar mobilization, Taping, Traction, Ultrasound, Biofeedback, Ionotophoresis '4mg'$ /ml Dexamethasone, and Manual therapy.   PLAN FOR  NEXT SESSION: Progress postural strength, improve cervical and shoulder AROM.   4:59 PM, 11/04/21 Korynne Dols PT, DPT

## 2021-11-06 ENCOUNTER — Encounter (HOSPITAL_COMMUNITY): Payer: Self-pay

## 2021-11-06 ENCOUNTER — Ambulatory Visit (HOSPITAL_COMMUNITY): Payer: Medicare Other

## 2021-11-06 DIAGNOSIS — E876 Hypokalemia: Secondary | ICD-10-CM | POA: Diagnosis not present

## 2021-11-06 DIAGNOSIS — M542 Cervicalgia: Secondary | ICD-10-CM

## 2021-11-06 DIAGNOSIS — M545 Low back pain, unspecified: Secondary | ICD-10-CM | POA: Diagnosis not present

## 2021-11-06 DIAGNOSIS — N189 Chronic kidney disease, unspecified: Secondary | ICD-10-CM | POA: Diagnosis not present

## 2021-11-06 DIAGNOSIS — R2689 Other abnormalities of gait and mobility: Secondary | ICD-10-CM | POA: Diagnosis not present

## 2021-11-06 DIAGNOSIS — E1122 Type 2 diabetes mellitus with diabetic chronic kidney disease: Secondary | ICD-10-CM | POA: Diagnosis not present

## 2021-11-06 DIAGNOSIS — E6609 Other obesity due to excess calories: Secondary | ICD-10-CM | POA: Diagnosis not present

## 2021-11-06 DIAGNOSIS — R293 Abnormal posture: Secondary | ICD-10-CM

## 2021-11-06 DIAGNOSIS — E1129 Type 2 diabetes mellitus with other diabetic kidney complication: Secondary | ICD-10-CM | POA: Diagnosis not present

## 2021-11-06 DIAGNOSIS — R809 Proteinuria, unspecified: Secondary | ICD-10-CM | POA: Diagnosis not present

## 2021-11-06 DIAGNOSIS — M6281 Muscle weakness (generalized): Secondary | ICD-10-CM | POA: Diagnosis not present

## 2021-11-06 DIAGNOSIS — G8929 Other chronic pain: Secondary | ICD-10-CM | POA: Diagnosis not present

## 2021-11-06 DIAGNOSIS — I129 Hypertensive chronic kidney disease with stage 1 through stage 4 chronic kidney disease, or unspecified chronic kidney disease: Secondary | ICD-10-CM | POA: Diagnosis not present

## 2021-11-06 NOTE — Therapy (Signed)
OUTPATIENT PHYSICAL THERAPY CERVICAL TREATMENT   Patient Name: Amber Stephenson MRN: [id number] DOB:1945-12-26, 76 y.o., female Today's Date: 11/06/2021   PT End of Session - 11/06/21 1759     Visit Number 3    Number of Visits 12    Date for PT Re-Evaluation 12/12/21    Authorization Type Medicare A; [employer]    Progress Note Due on Visit 10    PT Start Time 4696    PT Stop Time 1826    PT Time Calculation (min) 43 min    Activity Tolerance Patient tolerated treatment well;Patient limited by pain;No increased pain    Behavior During Therapy [facility] for tasks assessed/performed              Past Medical History:  Diagnosis Date   Acute on chronic respiratory failure with hypoxia ([place]) 01/22/2019   Asthma    Atrial fibrillation with RVR 05/19/2017   Bipolar I disorder 01/23/2015   CAD (coronary artery disease)    Nonobstructive; Managed by Dr. Bronson Ing   Cardiomegaly 01/12/2018   Chronic anticoagulation 05/30/2015   Chronic atrial fibrillation ([place]) 01/04/2015   Chronic back pain    Lower back   Chronic diastolic CHF (congestive heart failure) 05/30/2015   Diabetes mellitus, type 2, without complication    Diabetic neuropathy 02/06/2016   Dysrhythmia    A-Fib   Essential hypertension    Herpes genitalis in women 07/16/2015   Hyperlipidemia    Hypoxia 02/01/2019   Insomnia 01/23/2015   Lipoma 02/08/2015   Major depressive disorder    Migraine headache with aura 02/12/2016   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   NAFL (nonalcoholic fatty liver) 29/52/8413   OSA (obstructive sleep apnea) 02/24/2019   10/09/2018 - HST  - AHI 40.6    Pulmonary hypertension    Renal insufficiency    Managed by Dr. Wallace Keller   RLS (restless legs syndrome) 04/27/2015   Past Surgical History:  Procedure Laterality Date   BREAST REDUCTION SURGERY     EYE SURGERY Right    cateracts   HAMMER TOE SURGERY     LEFT HEART CATH AND CORONARY ANGIOGRAPHY N/A 02/03/2018    Procedure: LEFT HEART CATH AND CORONARY ANGIOGRAPHY;  Surgeon: Martinique, Peter M, MD;  Location: [place] [facility];  Service: Cardiovascular;  Laterality: N/A;   REVERSE SHOULDER ARTHROPLASTY Right 08/19/2019   Procedure: REVERSE SHOULDER ARTHROPLASTY;  Surgeon: Netta Cedars, MD;  Location: [facility];  Service: Orthopedics;  Laterality: Right;  interscalene block   SHOULDER SURGERY Right    "I BROKE MY SHOUDLER   THIGH SURGERY     "TO REMOVE A TUMOR "   Patient Active Problem List   Diagnosis Date Noted   Myofascial pain 10/25/2021   Superficial fungus infection of skin 09/03/2021   Chronic right-sided low back pain with right-sided sciatica 07/19/2021   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   Vaginal itching 04/16/2021   Dysuria 04/16/2021   Yeast infection 01/14/2021   Type 2 diabetes mellitus without complication, with long-term current use of insulin ([place]) 12/25/2020   Acute on chronic diastolic CHF (congestive heart failure)/HFpEF Exacerbation 12/16/2020   Acute respiratory failure with hypoxia ([place]) 12/16/2020   UTI (urinary tract infection) 12/14/2020   Elevated brain natriuretic peptide (BNP) level 12/14/2020   Elevated troponin 12/14/2020   Acute exacerbation of CHF (congestive heart failure) ([place]) 12/13/2020   Chronic post-traumatic stress disorder 12/06/2020   Bipolar I disorder, most recent episode (or current) manic ([place])  12/06/2020   Tremor 12/06/2020   Senile nuclear sclerosis 12/06/2020   Senile corneal changes 12/06/2020   Psychoses ([place]) 12/06/2020   Presbyopia 12/06/2020   Postmenopausal bleeding 12/06/2020   Postcoital bleeding 12/06/2020   Pinguecula 12/06/2020   Pain in joint, shoulder region 12/06/2020   Other psychological or physical stress 12/06/2020   Other acquired hammer toe 12/06/2020   Osteopenia 12/06/2020   Opioid dependence ([place]) 12/06/2020   Non-neoplastic nevus 12/06/2020   Essential hypertriglyceridemia 12/06/2020   Hypermetropia  12/06/2020   Debility 12/06/2020   Cystitis 12/06/2020   Depressed bipolar I disorder in full remission ([place]) 12/06/2020   Benzodiazepine dependence ([place]) 12/06/2020   Benign neoplasm of skin 12/06/2020   Anxiety 12/06/2020   Functional diarrhea 10/26/2020   Diabetes mellitus due to underlying condition with hyperglycemia, with long-term current use of insulin ([place]) 09/24/2020   Dyslipidemia 09/24/2020   Head trauma 09/17/2020   Chronic respiratory failure with hypoxia ([place]) 04/25/2020   Chronic constipation 04/25/2020   Back pain/sacroiliitis--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   Fall at home, initial encounter 04/23/2020   Ambulatory dysfunction--back pain and falls 04/23/2020   Chronic pain syndrome 08/22/2019   Nerve pain 08/22/2019   Myofascial pain dysfunction syndrome 08/22/2019   Dyspnea 08/22/2019   CAD (coronary artery disease)    Hypocalcemia    S/P shoulder replacement, 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   History of adenomatous polyp of colon 05/21/2019   Polypharmacy 05/21/2019   Benign paroxysmal positional vertigo due to bilateral vestibular disorder 05/20/2019   Hyperlipidemia associated with type 2 diabetes mellitus ([place]) 05/20/2019   Vertigo 04/25/2019   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   OSA (obstructive sleep apnea) 02/24/2019   Allergic rhinitis 02/24/2019   Acute on chronic respiratory failure with hypoxia ([place]) 01/22/2019   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   Morbid obesity with alveolar hypoventilation ([place]) 07/06/2018   Obesity (BMI 30-39.9) 07/06/2018   At risk for adverse drug event 07/06/2018   Cardiomegaly 88/28/0034   Non-alcoholic fatty liver disease 01/12/2018   Mild intermittent asthma 01/12/2018   Senile osteoporosis 12/15/2017   Atrial fibrillation with RVR ([place]) 05/19/2017   Fibromyalgia 03/19/2017   Migraine headache with aura 02/12/2016   Diabetic neuropathy associated with  type 2 diabetes mellitus ([place]) 02/06/2016   Bipolar 1 disorder, manic, moderate ([place]) 10/04/2015   Depression, recurrent ([place]) 10/03/2015   Herpes genitalis in women 07/16/2015   Long term current use of anticoagulant 05/30/2015   Family history of coronary arteriosclerosis 05/30/2015   Chronic diastolic heart failure ([place]) 05/30/2015   Dyspnea on exertion    Essential hypertension    Restless legs 04/27/2015   Lipoma 02/08/2015   Bipolar disorder ([place]) 01/23/2015   Insomnia 01/23/2015   Chronic back pain 01/04/2015   Chronic atrial fibrillation ([place]) 01/04/2015   Type 2 diabetes mellitus ([place])    Hyperlipidemia     PCP: [name] MD   REFERRING PROVIDER: Kary Kos, MD   REFERRING DIAG: M54.2 (ICD-10-CM) - Neck pain   THERAPY DIAG:  Cervicalgia  Abnormal posture  Rationale for Evaluation and Treatment Rehabilitation  ONSET DATE: Chronic  SUBJECTIVE:  SUBJECTIVE STATEMENT: pt reports that she is having a lot of pain in neck. Pt reports she has been very busy and has limitedly gotten to HEP. Pt gets up from waiting room stating she is in a lot of back pain today.  Reports the massage felt good last session.  Pt stated she is sore and stiff on left side of neck, pain comes and goes with movement.  Reports increased compliance with HEP without questions. PERTINENT HISTORY:  -Chronic LBP, Stage 3 Kidney disease, RT TSA, DM -Patient presents to therapy with complaint of chronic neck pain. This has been ongoing for several years, worsening over the past 2 years. No unknown mechanism of injury. She is seeing pain management. Notes chronic low back pain as well. She is currently managing symptoms with prescribed pain medication. No prior therapy for this issue. She has tried  acupuncture with good result.    PAIN:  Are you having pain? Yes: NPRS scale: 6/10 Pain location: neck, upper back Pain description: "hard pain" like breaking bones, sore and stiffness Aggravating factors: sleeping  Relieving factors: stretching, meds   PRECAUTIONS: None  WEIGHT BEARING RESTRICTIONS No  FALLS:  Has patient fallen in last 6 months? No  LIVING ENVIRONMENT: Lives with: lives with their spouse Lives in: House/apartment  OCCUPATION: Retired   PLOF: Independent with basic ADLs  PATIENT GOALS "Have less pain"  OBJECTIVE:   DIAGNOSTIC FINDINGS:  IMPRESSION: 1. Motion degraded exam. Overall grossly stable appearance of the cervical spine as compared to 04/23/2020. 2. Multilevel cervical spondylosis with small central/paracentral protrusions at C4-5 and C5-6. Secondary mild flattening of the ventral cord at these levels without cord signal changes. 3. Mild right C4 and left C5 foraminal stenosis, with mild to moderate left C7 foraminal narrowing, stable.    PATIENT SURVEYS:  FOTO 38% function    COGNITION: Overall cognitive status: Within functional limits for tasks assessed   POSTURE: rounded shoulders and forward head  PALPATION:   Moderate TTP about bilateral upper trap    CERVICAL ROM:   Active ROM A/PROM (deg) eval  Flexion 50  Extension 37  Right lateral flexion   Left lateral flexion   Right rotation 42  Left rotation 42   (Blank rows = not tested)  UPPER EXTREMITY ROM:  Mod restriction in RT shoulder elevation, min restriction in LT shoulder elevation   UPPER EXTREMITY MMT:  MMT Right eval Left eval  Shoulder flexion    Shoulder extension    Shoulder abduction    Shoulder adduction    Shoulder extension    Shoulder internal rotation    Shoulder external rotation    Middle trapezius    Lower trapezius    Elbow flexion    Elbow extension    Wrist flexion    Wrist extension    Wrist ulnar deviation    Wrist radial  deviation    Wrist pronation    Wrist supination    Grip strength     (Blank rows = not tested)   TODAY'S TREATMENT:   11/06/21:  Seated: 3D cervical excursion all directions in pain free range 10x each   RTB rows   Wback   Chin tuck 15x 5"   Shoulder rolls posterior (up, back and down)   Upper trap stretch R/L 2x 30" each   Levator scapula 2x 30"   Educated log rolling for back pain   Supine: STM to Bil UT L>R, levator, scalenes    2 sets x 27mn  cervical traction and suboccipital release  11/04/21  Upper trap stretch b/l 10 sec holds x3 b/l   Serratus stretch b/l 10 sec holds x3 b/l   Scap rolls in seated x10  Chin tuck x15  STM to sub occipital and upper trap region  Mob to spinous process C4 with left lateral flexion following mulligan concept  Self SNAG for cervical extension and rotation following mulligan concept.     10/31/21 Scap retraction Chic tuck Upper trap stretch    PATIENT EDUCATION:  Education details:HEP updated 6/5 Person educated: Patient Education method: Explanation Education comprehension: verbalized understanding   HOME EXERCISE PROGRAM: URL: [email or web address] Access Code: [email or web address] Date 11/06/21 Prepared by: Ihor Austin, LPTA/CLT; CBIS Wback 3x 10 5" holds    Access Code: [email or web address] URL: [email or web address] Date: 11/04/2021 Prepared by: Leota Jacobsen  Exercises - Seated Levator Scapulae Stretch  - 2 x daily - 7 x weekly - 1 sets - 3 reps - 10sec hold - Shoulder Rolls in Sitting  - 2 x daily - 7 x weekly - 2 sets - 10 reps - Seated Assisted Cervical Rotation with Towel  - 2 x daily - 7 x weekly - 1 sets - 3 reps - Upper Cervical Extension SNAG with Strap  - 2 x daily - 7 x weekly - 1 sets - 5 reps  Access Code: [email or web address] URL: [email or web address] Date: 10/31/2021 Prepared by: Josue Hector  Exercises - Seated Scapular Retraction  - 3 x daily - 7 x weekly - 2 sets - 10 reps - 3 second hold -  Seated Cervical Retraction  - 3 x daily - 7 x weekly - 2 sets - 10 reps - 3 second hold - Seated Upper Trapezius Stretch  - 3 x daily - 7 x weekly - 1 sets - 3 reps - 30 second hold  ASSESSMENT:  CLINICAL IMPRESSION: Session focus with cervical mobility and postural strengthening.  Pt reports increased pain with sidebending movements, reports relief with reps.  Added theraband resistance and wback for scapular strengthening that was tolerated well.  Educated log rolling for back pain bed mobility.  EOS with manual STM to address cervical restrictions for mobility and pain control.  EOS pt reports pain reduced to 3/10.  Encouraged hydration to reduce risk of headache following manual.  OBJECTIVE IMPAIRMENTS decreased activity tolerance, decreased mobility, decreased ROM, decreased strength, hypomobility, increased fascial restrictions, increased muscle spasms, impaired flexibility, impaired UE functional use, improper body mechanics, postural dysfunction, and pain.   ACTIVITY LIMITATIONS carrying, lifting, sleeping, bathing, dressing, reach over head, hygiene/grooming, and locomotion level  PARTICIPATION LIMITATIONS: cleaning, laundry, driving, shopping, community activity, and yard work  PERSONAL FACTORS  Chronic pain Hx  are also affecting patient's functional outcome.   REHAB POTENTIAL: Good  CLINICAL DECISION MAKING: Stable/uncomplicated  EVALUATION COMPLEXITY: Low   GOALS: SHORT TERM GOALS: Target date: 11/21/2021  Patient will be independent with initial HEP and self-management strategies to improve functional outcomes Baseline:  Goal status: Ongoing   LONG TERM GOALS: Target date: 12/12/2021  Patient will be independent with advanced HEP and self-management strategies to improve functional outcomes Baseline:  Goal status: Ongoing  2.  Patient will improve FOTO score to predicted value to indicate improvement in functional outcomes Baseline: 38%  Goal status: Ongoing  3.   Patient will demo improved bilateral cervical rotation by 10 degrees in order to improve ability to scan environment for safety and while driving. Baseline: See AROM  Goal status: Ongoing  4. Patient will report  a decrease in neck pain to no more than 3/10 for improved quality of life and ability to perform UE ADLs  Baseline: 8/10 Goal status: Ongoing  PLAN: PT FREQUENCY: 2x/week  PT DURATION: 6 weeks  PLANNED INTERVENTIONS: Therapeutic exercises, Therapeutic activity, Neuromuscular re-education, Balance training, Gait training, Patient/Family education, Joint manipulation, Joint mobilization, Stair training, Aquatic Therapy, Dry Needling, Electrical stimulation, Spinal manipulation, Spinal mobilization, Cryotherapy, Moist heat, scar mobilization, Taping, Traction, Ultrasound, Biofeedback, Ionotophoresis '4mg'$ /ml Dexamethasone, and Manual therapy.   PLAN FOR NEXT SESSION: Progress postural strength, improve cervical and shoulder AROM.  Begin chin tucks front of wall.  Ihor Austin, LPTA/CLT; Delana Meyer [phone]  6:31 PM, 11/06/21

## 2021-11-07 ENCOUNTER — Encounter: Payer: Self-pay | Admitting: Physician Assistant

## 2021-11-07 ENCOUNTER — Ambulatory Visit (INDEPENDENT_AMBULATORY_CARE_PROVIDER_SITE_OTHER): Payer: Medicare Other | Admitting: Physician Assistant

## 2021-11-07 VITALS — BP 143/108 | HR 70 | Resp 18 | Ht 62.0 in | Wt 192.0 lb

## 2021-11-07 DIAGNOSIS — I2510 Atherosclerotic heart disease of native coronary artery without angina pectoris: Secondary | ICD-10-CM

## 2021-11-07 DIAGNOSIS — F0670 Mild neurocognitive disorder due to known physiological condition without behavioral disturbance: Secondary | ICD-10-CM

## 2021-11-07 DIAGNOSIS — I999 Unspecified disorder of circulatory system: Secondary | ICD-10-CM

## 2021-11-07 NOTE — Progress Notes (Signed)
Assessment/Plan:   Mild Cognitive Impairment, likely vascular   Amber Stephenson is a very pleasant 76 y.o. RH female with extensive medical history including hypertension, hyperlipidemia, diabetes, A-fib on Eliquis, chronic pain, bipolar disorder on multiple antipsychotics, recent tremors due to Abilify (now discontinued), seen today in follow up for memory loss.  Patient is on donepezil 10 mg daily, tolerating well.  MMSE today is actually improved at 28/30, with delayed recall 3/3, with only deficiency and spelling WORLD   Recommendations:    Continue donepezil 10 mg daily Side effects were discussed Continue monitor mood as per Psychiatry PCP to arrange evaluation of OSA/CPAP which can certainly improve her cognitive status  Repeat Neurocognitive testing for clarity of the diagnosis and trajectory of the disease. Strong control of her cardiovascular risk factors.  Of note, the patient's blood pressure is 143/108 today, she admits to have forgotten taking the blood pressure medication. Follow up in  6 months.   Case discussed with Dr. Delice Lesch who agrees with the plan     Subjective:    Amber Stephenson is a very pleasant 54 y.o. RH female  seen today in follow up for memory loss. This patient is again accompanied in the office by her husband who supplements the history.  Previous records as well as any outside records available were reviewed prior to todays visit.  Patient was last seen at our office for evaluation of memory on 08/27/2020 (then seen on him for tremors on 07/12/2021) on  at which time her MMSE was 25/30.  She is currently on donepezil, 10 mg daily tolerating well   Any changes in memory since last visit?  Patient denies, husband reports that her memory is worse, especially short-term memory.  She may have some word finding difficulties. Patient lives with: Spouse  repeats oneself?  Patient denies repeating herself Disoriented when walking into a room?  Patient  denies   Leaving objects in unusual places?  Patient denies   Ambulates  with difficulty?   Patient reports that has chronic back pain, and she is on multiple medications for that including Dilaudid and lyrica, as well as lorazepam and carbamazepine, doxepin Recent falls?  Patient denies   Any head injuries?  Patient denies   History of seizures?   Patient denies   Wandering behavior?  Patient denies   Patient drives?   Patient no longer drives   Any mood changes?  Patient is under the care of Dr. Casimiro Needle for her psychiatric needs.  Apparently, patient has been advised to be seen by behavioral therapy, but she refuses to go because "I do not go there to that dark tunnel ".  Her husband reports that would be very detrimental for her. Any history of depression?:  Endorsed.She is on multiple meds, including lexapro, ativan among others Hallucinations?  Patient denies   Paranoia?  Patient denies   Patient reports that he sleeps well without vivid dreams, REM behavior or sleepwalking   History of sleep apnea?  Patient denies, but these appears to be an issue, as stated by her psychiatrist and her PCP.  Prior to this visit, there was a discussion with PCP, who is aware, needs to order sleep studies as an outpatient, to further evaluate it, as it may affect positively her memory issues. Any hygiene concerns?  Patient denies   Independent of bathing and dressing?  Endorsed  Does the patient needs help with medications?  Her husband fixes the pillbox and then she takes  it by herself, she admits to missing occasionally it dose Who is in charge of the finances?  Husband is in charge Any changes in appetite?  Patient denies   Patient have trouble swallowing? Patient denies   Does the patient cook?  Patient denies   Any kitchen accidents such as leaving the stove on? Patient denies   Any headaches?  Patient denies   The double vision? Patient denies   Any focal numbness or tingling?  Patient denies    Chronic back pain Patient denies   Unilateral weakness?  Patient denies   Any tremors?  Since discontinuing Abilify, her tremors have resolved.  The only remaining tremors are on her lips.  Tremors do not stop her activities of daily living. Any history of anosmia?  Patient denies   Any incontinence of urine?  Patient denies   Any bowel dysfunction?   Patient denies     History on Initial Assessment 07/17/2017: This is a 75 year old right-handed woman with a history of hypertension, hyperlipidemia, diabetes, atrial fibrillation, chronic pain, bipolar disorder, presenting for evaluation of memory changes. She feels her memory has gotten really bad over the past 2 months. She cannot remember words/word-finding difficulties. She "does not think it's Alzheimer's" but feels like she is not paying attention. Family reminds her that they had told her something already previously. Her husband has to remind her of her medications. They put her pills together in a pillbox. Her husband is in charge of finances. She stopped driving 4 years ago, she denied getting lost when she used to drive. She started noticing memory changes after she was started on a medication after she had her colonoscopy. She has also been dealing with a lot of pain, she states her Percocet dose was cut in half a month ago and this is causing her a lot of issues. She has poor sleep, 3 hours at the most. She was recently started on Trazodone by her psychiatrist, which may be helping. Her father had Alzheimer's disease. She denies any history of significant head injuries, no alcohol use.   She has frequent headaches, around 20 headache days a month with stabbing pain in the frontal region that can last up to 3 days, with associated nausea/vomiting. If she takes medication, pain lasts up to 2 hours. She has dizziness at least 1-2 times a day where it feels like the room is spinning. She has had falls for "no clear reason," last fall was 3 months  ago. She has chronic neck and back pain, no focal numbness/tingling/weakness. She has tremors in both hands and has a diagnosis of essential tremor, it is "very difficult to do things," it has affected her handwriting, computer use. No family history of tremors. No bowel/bladder dysfunction or anosmia.      I personally reviewed MRI brain without contrast done 05/13/17 which did not show any acute changes. There was mild diffuse atrophy and mild to moderate chronic microvascular disease.   Neuropsychological testing in 04/2019 showed primary areas of impairment with processing speed and attention/concentration. 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 She also scored in the mild range of anxiety and depression, and it was felt that the combination of these with daily headaches, dizziness, sleep apnea, and chronic pain, negatively influence day to day cognitive function.    MRI brain 04/28/21  1. No acute intracranial process. 2. Mildly advanced atrophy for age, with a  suggestion of slightly greater atrophy in the right temporal lobe.   Past Medical History:  Diagnosis Date   Acute on chronic respiratory failure with hypoxia ([place]) 01/22/2019   Asthma    Atrial fibrillation with RVR 05/19/2017   Bipolar I disorder 01/23/2015   CAD (coronary artery disease)    Nonobstructive; Managed by Dr. Bronson Ing   Cardiomegaly 01/12/2018   Chronic anticoagulation 05/30/2015   Chronic atrial fibrillation ([place]) 01/04/2015   Chronic back pain    Lower back   Chronic diastolic CHF (congestive heart failure) 05/30/2015   Diabetes mellitus, type 2, without complication    Diabetic neuropathy 02/06/2016   Dysrhythmia    A-Fib   Essential hypertension    Herpes genitalis in women 07/16/2015   Hyperlipidemia    Hypoxia 02/01/2019   Insomnia 01/23/2015   Lipoma 02/08/2015   Major depressive disorder     Migraine headache with aura 02/12/2016   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   NAFL (nonalcoholic fatty liver) 97/67/3419   OSA (obstructive sleep apnea) 02/24/2019   10/09/2018 - HST  - AHI 40.6    Pulmonary hypertension    Renal insufficiency    Managed by Dr. Wallace Keller   RLS (restless legs syndrome) 04/27/2015     Past Surgical History:  Procedure Laterality Date   BREAST REDUCTION SURGERY     EYE SURGERY Right    cateracts   HAMMER TOE SURGERY     LEFT HEART CATH AND CORONARY ANGIOGRAPHY N/A 02/03/2018   Procedure: LEFT HEART CATH AND CORONARY ANGIOGRAPHY;  Surgeon: Martinique, Peter M, MD;  Location: [facility];  Service: Cardiovascular;  Laterality: N/A;   REVERSE SHOULDER ARTHROPLASTY Right 08/19/2019   Procedure: REVERSE SHOULDER ARTHROPLASTY;  Surgeon: Netta Cedars, MD;  Location: [facility];  Service: Orthopedics;  Laterality: Right;  interscalene block   SHOULDER SURGERY Right    "I BROKE MY SHOUDLER   THIGH SURGERY     "TO REMOVE A TUMOR "     PREVIOUS MEDICATIONS:   CURRENT MEDICATIONS:  Outpatient Encounter Medications as of 11/07/2021  Medication Sig   albuterol (PROVENTIL) (2.5 MG/3ML) 0.083% nebulizer solution Take 3 mLs (2.5 mg total) by nebulization every 6 (six) hours as needed for wheezing or shortness of breath.   albuterol (VENTOLIN HFA) 108 (90 Base) MCG/ACT inhaler Inhale 2 puffs into the lungs every 6 (six) hours as needed for wheezing or shortness of breath.   alendronate (FOSAMAX) 70 MG tablet Take 1 tablet (70 mg total) by mouth every 7 (seven) days. Take with a full glass of water on an empty stomach. Do not lay down for at least 2 hours   apixaban (ELIQUIS) 5 MG TABS tablet Take 1 tablet (5 mg total) by mouth 2 (two) times daily.   ARIPiprazole (ABILIFY) 2 MG tablet Take 2 mg by mouth at bedtime.   ARIPiprazole (ABILIFY) 5 MG tablet Take 5 mg by mouth at bedtime.   atorvastatin (LIPITOR) 40 MG tablet TAKE 1 TABLET DAILY   B-D UF III MINI PEN  NEEDLES 31G X 5 MM MISC Inject into the skin.   BELSOMRA 20 MG TABS Take 1 tablet by mouth at bedtime.   carbamazepine (CARBATROL) 100 MG 12 hr capsule Take by mouth at bedtime.   Continuous Blood Gluc Sensor (DEXCOM G6 SENSOR) MISC 1 Device by Does not apply route as directed.   Continuous Blood Gluc Transmit (DEXCOM G6 TRANSMITTER) MISC 1 Device by Does not apply route as directed.   denosumab (  PROLIA) 60 MG/ML SOSY injection Inject 60 mg into the skin every 6 (six) months.   diltiazem (CARDIZEM CD) 120 MG 24 hr capsule Take 1 capsule (120 mg total) by mouth at bedtime.   doxepin (SINEQUAN) 10 MG capsule Take 10 mg by mouth at bedtime.   empagliflozin (JARDIANCE) 25 MG TABS tablet Take 1 tablet (25 mg total) by mouth daily before breakfast.   escitalopram (LEXAPRO) 10 MG tablet Take 10 mg by mouth daily.   fluconazole (DIFLUCAN) 150 MG tablet Take 1 now and repeat 1 in 3 days   Fluticasone-Salmeterol (ADVAIR DISKUS) 500-50 MCG/DOSE AEPB Inhale 1 puff into the lungs in the morning and at bedtime.   furosemide (LASIX) 40 MG tablet Take 1 tablet (40 mg total) by mouth every morning. TAKE 1 TABLET EVERY MORNING   Galcanezumab-gnlm (EMGALITY) 120 MG/ML SOAJ INJECT INTO THE SKIN ONCE MONTHLY   HYDROmorphone (DILAUDID) 2 MG tablet Take 1 tablet (2 mg total) by mouth every 6 (six) hours as needed for severe pain.   icosapent Ethyl (VASCEPA) 1 g capsule TAKE 2 CAPSULES TWICE A DAY WITH MEALS   insulin glargine (LANTUS SOLOSTAR) 100 UNIT/ML Solostar Pen Inject 38 Units into the skin 2 (two) times daily.   insulin lispro (HUMALOG KWIKPEN) 100 UNIT/ML KwikPen Max Daily 50 units   Insulin Pen Needle (PEN NEEDLES) 30G X 5 MM MISC Use to inject insulin 5 times daily   LORazepam (ATIVAN) 1 MG tablet Take 1 tablet (1 mg total) by mouth 2 (two) times daily. And give 2 tablets by mouth at bedtime   meclizine (ANTIVERT) 25 MG tablet Take 1 tablet (25 mg total) by mouth 3 (three) times daily as needed for  dizziness.   metFORMIN (GLUCOPHAGE) 500 MG tablet Take 1 tablet (500 mg total) by mouth 2 (two) times daily with a meal. TAKE 1 TABLET(500 MG) BY MOUTH TWICE DAILY AFTER A MEAL   metoprolol succinate (TOPROL XL) 25 MG 24 hr tablet Take 1 tablet (25 mg total) by mouth in the morning and at bedtime.   metoprolol succinate (TOPROL-XL) 100 MG 24 hr tablet Take 1 tablet (100 mg total) by mouth in the morning and at bedtime.   naloxone (NARCAN) nasal spray 4 mg/0.1 mL SMARTSIG:Both Nares   NON FORMULARY Diet: _____ Regular, ___  __ NAS, ___x____Consistent Carbohydrate, _______NPO _____Other   nystatin (MYCOSTATIN/NYSTOP) powder Apply 1 application. topically 3 (three) times daily.   polyethylene glycol (MIRALAX / GLYCOLAX) 17 g packet Take 17 g by mouth daily.   potassium chloride SA (KLOR-CON) 20 MEQ tablet Take 1 tablet (20 mEq total) by mouth daily.   pregabalin (LYRICA) 200 MG capsule TAKE 1 CAPSULE(200 MG) BY MOUTH TWICE DAILY   Semaglutide (RYBELSUS) 14 MG TABS Take 1 tablet (14 mg total) by mouth daily.   tiZANidine (ZANAFLEX) 4 MG capsule TAKE 1 CAPSULE BY MOUTH THREE TIMES DAILY AS NEEDED FOR MUSCLE SPASMS   Vitamin D, Cholecalciferol, 25 MCG (1000 UT) TABS Take 1 tablet by mouth daily in the afternoon.   Facility-Administered Encounter Medications as of 11/07/2021  Medication   bupivacaine-epinephrine (MARCAINE W/ EPI) 0.5% -1:200000 injection   denosumab (PROLIA) injection 60 mg     Objective:     PHYSICAL EXAMINATION:    VITALS:   Vitals:   11/07/21 1525  BP: (!) 143/108  Pulse: 70  Resp: 18  SpO2: 98%  Weight: 192 lb (87.1 kg)  Height: '5\' 2"'$  (1.575 m)    GEN:  The patient  appears stated age and is in NAD. HEENT:  Normocephalic, atraumatic.   Neurological examination:  General: NAD, well-groomed, appears stated age. Flat affect Orientation: The patient is alert. Oriented to person, place and date Cranial nerves: There is good facial symmetry.The speech is fluent  and clear. No aphasia or dysarthria. Fund of knowledge is appropriate. Recent memory impaired and remote memory is normal.  Attention and concentration are reduced.  Able to name objects and repeat phrases.  Hearing is intact to conversational tone.    Sensation: Sensation is intact to light touch throughout Motor: Strength is at least antigravity x4. Tremors: none  DTR's 2/4 in UE/LE      01/26/2019   11:00 AM 08/06/2018   11:00 AM  Montreal Cognitive Assessment   Visuospatial/ Executive (0/5) 2 4  Naming (0/3) 3 3  Attention: Read list of digits (0/2) 1 2  Attention: Read list of letters (0/1) 0 1  Attention: Serial 7 subtraction starting at 100 (0/3) 0 1  Language: Repeat phrase (0/2) 1 1  Language : Fluency (0/1) 0 0  Abstraction (0/2) 1 1  Delayed Recall (0/5) 0 4  Orientation (0/6) 3 6  Total 11 23       08/28/2020   11:00 AM 01/18/2018    4:00 PM 09/22/2017    2:28 PM  MMSE - Mini Mental State Exam  Orientation to time '4 5 5  '$ Orientation to Place '5 5 5  '$ Registration '3 3 3  '$ Attention/ Calculation '1 3 5  '$ Recall '3 3 3  '$ Language- name 2 objects '2 2 2  '$ Language- repeat '1 1 1  '$ Language- follow 3 step command '3 3 3  '$ Language- read & follow direction '1 1 1  '$ Write a sentence '1 1 1  '$ Copy design '1 1 1  '$ Total score '25 28 30       '$ Movement examination: Tone: There is normal tone in the UE/LE Abnormal movements:  no tremor.  No myoclonus.  No asterixis.   Coordination:  There is no decremation with RAM's. Normal finger to nose  Gait and Station: The patient has no difficulty arising out of a deep-seated chair without the use of the hands. The patient's stride length is good.  Gait is cautious and narrow.    Thank you for allowing [place] the opportunity to participate in the care of this nice patient. Please do not hesitate to contact us for any questions or concerns.   Total time spent on today's visit was 31 minutes dedicated to this patient today, preparing to see patient,  examining the patient, ordering tests and/or medications and counseling the patient, documenting clinical information in the EHR or other health record, independently interpreting results and communicating results to the patient/family, discussing treatment and goals, answering patient's questions and coordinating care.  Cc:  [name], MD  [name] 11/08/2021 7:51 AM   Cc:  [name], MD [name], PA-C

## 2021-11-07 NOTE — Patient Instructions (Signed)
It was a pleasure to see you today at our office.   Recommendations:  Follow up in 6  months Repeat the neurocognitive testing   Whom to call:  Memory  decline, memory medications: Call our office [phone]   For psychiatric meds, mood meds: Please have your primary care physician manage these medications.   Counseling regarding caregiver distress, including caregiver depression, anxiety and issues regarding community resources, adult day care programs, adult living facilities, or memory care questions:   Feel free to contact [place], Social Worker at [phone]   For assessment of decision of mental capacity and competency:  Call Dr. Anthoney Harada, geriatric psychiatrist at [phone]  For guidance in geriatric dementia issues please call [facility] [phone]  For guidance regarding [facility] and if placement were needed at the facility, contact Arnell Asal, Social Worker tel: [phone]  If you have any severe symptoms of a stroke, or other severe issues such as confusion,severe chills or fever, etc call 911 or go to the ER as you may need to be evaluated further   Feel free to visit Facebook page " [employer]" for tips of how to care for people with memory problems.    Feel free to go to the following database for funded clinical studies conducted around the world: http://saunders.com/   https://www.triadclinicaltrials.com/     RECOMMENDATIONS FOR ALL PATIENTS WITH MEMORY PROBLEMS: 1. Continue to exercise (Recommend 30 minutes of walking everyday, or 3 hours every week) 2. Increase social interactions - continue going to [place] and enjoy social gatherings with friends and family 3. Eat healthy, avoid fried foods and eat more fruits and vegetables 4. Maintain adequate blood pressure, blood sugar, and blood cholesterol level. Reducing the risk of stroke and cardiovascular disease also helps promoting better  memory. 5. Avoid stressful situations. Live a simple life and avoid aggravations. Organize your time and prepare for the next day in anticipation. 6. Sleep well, avoid any interruptions of sleep and avoid any distractions in the bedroom that may interfere with adequate sleep quality 7. Avoid sugar, avoid sweets as there is a strong link between excessive sugar intake, diabetes, and cognitive impairment We discussed the Mediterranean diet, which has been shown to help patients reduce the risk of progressive memory disorders and reduces cardiovascular risk. This includes eating fish, eat fruits and green leafy vegetables, nuts like almonds and hazelnuts, walnuts, and also use olive oil. Avoid fast foods and fried foods as much as possible. Avoid sweets and sugar as sugar use has been linked to worsening of memory function.  There is always a concern of gradual progression of memory problems. If this is the case, then we may need to adjust level of care according to patient needs. Support, both to the patient and caregiver, should then be put into place.    FALL PRECAUTIONS: Be cautious when walking. Scan the area for obstacles that may increase the risk of trips and falls. When getting up in the mornings, sit up at the edge of the bed for a few minutes before getting out of bed. Consider elevating the bed at the head end to avoid drop of blood pressure when getting up. Walk always in a well-lit room (use night lights in the walls). Avoid area rugs or power cords from appliances in the middle of the walkways. Use a walker or a cane if necessary and consider physical therapy for balance exercise. Get your eyesight checked regularly.  FINANCIAL OVERSIGHT: Supervision,  especially oversight when making financial decisions or transactions is also recommended.  HOME SAFETY: Consider the safety of the kitchen when operating appliances like stoves, microwave oven, and blender. Consider having supervision and share  cooking responsibilities until no longer able to participate in those. Accidents with firearms and other hazards in the house should be identified and addressed as well.   ABILITY TO BE LEFT ALONE: If patient is unable to contact 911 operator, consider using LifeLine, or when the need is there, arrange for someone to stay with patients. Smoking is a fire hazard, consider supervision or cessation. Risk of wandering should be assessed by caregiver and if detected at any point, supervision and safe proof recommendations should be instituted.  MEDICATION SUPERVISION: Inability to self-administer medication needs to be constantly addressed. Implement a mechanism to ensure safe administration of the medications.   DRIVING: Regarding driving, in patients with progressive memory problems, driving will be impaired. We advise to have someone else do the driving if trouble finding directions or if minor accidents are reported. Independent driving assessment is available to determine safety of driving.   If you are interested in the driving assessment, you can contact the following:  [employer] in [place]  [place] [phone]  [place]  [facility] [phone] or [phone]

## 2021-11-11 ENCOUNTER — Other Ambulatory Visit: Payer: Self-pay | Admitting: Student

## 2021-11-11 ENCOUNTER — Emergency Department (HOSPITAL_BASED_OUTPATIENT_CLINIC_OR_DEPARTMENT_OTHER)
Admission: EM | Admit: 2021-11-11 | Discharge: 2021-11-11 | Disposition: A | Discharge status: Home / Self Care | Payer: Medicare Other | Attending: Emergency Medicine | Admitting: Emergency Medicine

## 2021-11-11 ENCOUNTER — Other Ambulatory Visit (HOSPITAL_BASED_OUTPATIENT_CLINIC_OR_DEPARTMENT_OTHER): Payer: Self-pay

## 2021-11-11 ENCOUNTER — Emergency Department (HOSPITAL_BASED_OUTPATIENT_CLINIC_OR_DEPARTMENT_OTHER): Payer: Medicare Other

## 2021-11-11 ENCOUNTER — Encounter: Payer: Self-pay | Admitting: Family Medicine

## 2021-11-11 ENCOUNTER — Other Ambulatory Visit: Payer: Self-pay

## 2021-11-11 ENCOUNTER — Ambulatory Visit (INDEPENDENT_AMBULATORY_CARE_PROVIDER_SITE_OTHER): Payer: Medicare Other | Admitting: Family Medicine

## 2021-11-11 ENCOUNTER — Encounter (HOSPITAL_BASED_OUTPATIENT_CLINIC_OR_DEPARTMENT_OTHER): Payer: Self-pay

## 2021-11-11 VITALS — BP 135/86 | HR 65 | Temp 96.9°F | Ht 62.0 in | Wt 193.1 lb

## 2021-11-11 DIAGNOSIS — Z7984 Long term (current) use of oral hypoglycemic drugs: Secondary | ICD-10-CM | POA: Insufficient documentation

## 2021-11-11 DIAGNOSIS — I110 Hypertensive heart disease with heart failure: Secondary | ICD-10-CM | POA: Insufficient documentation

## 2021-11-11 DIAGNOSIS — Z7901 Long term (current) use of anticoagulants: Secondary | ICD-10-CM | POA: Diagnosis not present

## 2021-11-11 DIAGNOSIS — Z91040 Latex allergy status: Secondary | ICD-10-CM | POA: Insufficient documentation

## 2021-11-11 DIAGNOSIS — R0602 Shortness of breath: Secondary | ICD-10-CM | POA: Diagnosis not present

## 2021-11-11 DIAGNOSIS — W19XXXA Unspecified fall, initial encounter: Secondary | ICD-10-CM | POA: Diagnosis not present

## 2021-11-11 DIAGNOSIS — M542 Cervicalgia: Secondary | ICD-10-CM

## 2021-11-11 DIAGNOSIS — E1140 Type 2 diabetes mellitus with diabetic neuropathy, unspecified: Secondary | ICD-10-CM | POA: Insufficient documentation

## 2021-11-11 DIAGNOSIS — S22080A Wedge compression fracture of T11-T12 vertebra, initial encounter for closed fracture: Secondary | ICD-10-CM | POA: Diagnosis not present

## 2021-11-11 DIAGNOSIS — R519 Headache, unspecified: Secondary | ICD-10-CM | POA: Insufficient documentation

## 2021-11-11 DIAGNOSIS — M549 Dorsalgia, unspecified: Secondary | ICD-10-CM | POA: Diagnosis not present

## 2021-11-11 DIAGNOSIS — M546 Pain in thoracic spine: Secondary | ICD-10-CM

## 2021-11-11 DIAGNOSIS — Z794 Long term (current) use of insulin: Secondary | ICD-10-CM | POA: Insufficient documentation

## 2021-11-11 DIAGNOSIS — Z79899 Other long term (current) drug therapy: Secondary | ICD-10-CM | POA: Insufficient documentation

## 2021-11-11 DIAGNOSIS — M545 Low back pain, unspecified: Secondary | ICD-10-CM | POA: Diagnosis not present

## 2021-11-11 DIAGNOSIS — S0990XA Unspecified injury of head, initial encounter: Secondary | ICD-10-CM | POA: Diagnosis not present

## 2021-11-11 DIAGNOSIS — R42 Dizziness and giddiness: Secondary | ICD-10-CM | POA: Diagnosis not present

## 2021-11-11 DIAGNOSIS — I509 Heart failure, unspecified: Secondary | ICD-10-CM | POA: Diagnosis not present

## 2021-11-11 DIAGNOSIS — R079 Chest pain, unspecified: Secondary | ICD-10-CM | POA: Diagnosis not present

## 2021-11-11 DIAGNOSIS — S3992XA Unspecified injury of lower back, initial encounter: Secondary | ICD-10-CM | POA: Diagnosis present

## 2021-11-11 LAB — CBG MONITORING, ED: Glucose-Capillary: 131 mg/dL — ABNORMAL HIGH (ref 70–99)

## 2021-11-11 MED ORDER — FENTANYL CITRATE PF 50 MCG/ML IJ SOSY
50.0000 ug | PREFILLED_SYRINGE | Freq: Once | INTRAMUSCULAR | Status: AC
  Administered 2021-11-11: 50 ug via INTRAMUSCULAR
  Filled 2021-11-11: qty 1

## 2021-11-11 MED ORDER — LIDOCAINE 5 % EX PTCH
1.0000 | MEDICATED_PATCH | CUTANEOUS | 0 refills | Status: DC
  Filled 2021-11-11 (×2): qty 30, 30d supply, fill #0

## 2021-11-11 MED ORDER — LIDOCAINE 5 % EX PTCH
1.0000 | MEDICATED_PATCH | CUTANEOUS | Status: DC
  Administered 2021-11-11: 1 via TRANSDERMAL
  Filled 2021-11-11: qty 1

## 2021-11-11 MED ORDER — IPRATROPIUM-ALBUTEROL 0.5-2.5 (3) MG/3ML IN SOLN
3.0000 mL | Freq: Once | RESPIRATORY_TRACT | Status: AC
  Administered 2021-11-11: 3 mL via RESPIRATORY_TRACT
  Filled 2021-11-11: qty 3

## 2021-11-11 NOTE — Progress Notes (Signed)
   Acute Office Visit  Subjective:     Patient ID: Amber Stephenson, female    DOB: 10-17-1945, 76 y.o.   MRN: [id number]  Chief Complaint  Patient presents with   Fall    Fall   Here with husband. Patient is in today for a fall that occurred 2 days ago. She fell in the bathroom and hit her head against a wall and door in the bathroom. Reports it was a forcible hit and cause he head and body to go backwards from the force. She now has neck and back pain. The pain is in the middle of her back. She reports that today she also feels dizzy and lightheaded. She feels like the room is spinning as well as lightheaded like she might pass out. She also feel more tired and sleepy this morning. She has also been short of breath today. She denies focal weakness, bleeding, headache, nausea, or vomiting. Denies changes in gait.   ROS As per HPI.      Objective:    BP 135/86   Pulse 65   Temp (!) 96.9 F (36.1 C) (Temporal)   Ht '5\' 2"'$  (1.575 m)   Wt 193 lb 2 oz (87.6 kg)   SpO2 90%   BMI 35.32 kg/m  BP Readings from Last 3 Encounters:  11/11/21 135/86  11/07/21 (!) 143/108  09/03/21 121/78      Physical Exam Vitals and nursing note reviewed.  Constitutional:      General: She is not in acute distress.    Appearance: She is not toxic-appearing.  HENT:     Head: Normocephalic and atraumatic.  Eyes:     Extraocular Movements: Extraocular movements intact.     Pupils: Pupils are equal, round, and reactive to light.  Cardiovascular:     Rate and Rhythm: Normal rate and regular rhythm.     Heart sounds: Normal heart sounds. No murmur heard. Pulmonary:     Effort: Pulmonary effort is normal. No respiratory distress.     Breath sounds: Normal breath sounds. No wheezing, rhonchi or rales.  Chest:     Chest wall: No tenderness.  Skin:    General: Skin is warm and dry.  Neurological:     Mental Status: She is oriented to person, place, and time. She is lethargic.     Cranial Nerves:  No facial asymmetry.     Motor: No tremor or pronator drift.     Gait: Gait is intact.     No results found for any visits on 11/11/21.      Assessment & Plan:   Amber Stephenson was seen today for fall.  Diagnoses and all orders for this visit:  Fall, initial encounter Injury of head, initial encounter Dizziness Shortness of breath Neck pain Acute midline thoracic back pain  Fall with head injury 2 days ago. Patient is on eliqus. Presents today with new dizziness, fatigue, sleepiness, and shortness of breath. O2 sats fluctuating between 87-92 today on exam. Appears to have decreased level of consciousness. Instructed to go to ER for further evaluation of new onset neurological symptoms following head injury, concerning for intracranial hemorrhage. Declined EMS, husband will take her now.   The patient indicates understanding of these issues and agrees with the plan.  Gwenlyn Perking, FNP

## 2021-11-11 NOTE — ED Provider Notes (Signed)
[name] Provider Note   CSN: [id number] Arrival date & time: 11/11/21  1037     History  Chief Complaint  Patient presents with   Amber Stephenson is a 76 y.o. female with history of A-fib currently on Eliquis, type 2 diabetes, chronic lower back pain after a fall several years ago (currently on pain management and takes Dilaudid at home), CHF and asthma who presents to the ED for evaluation after a fall that occurred at home 2 days ago.  Patient states that she slipped in the bathroom, falling backwards and hitting the back of her head on the corner of a wall that was jutting out from the linen closet.  Denies loss of consciousness.  She is experiencing severe pain in her neck and her mid back.  She has her chronic pain medications which do not seem to be making much improvement.  She is ambulatory at home without a walker or cane and remains so although she endorses increased pain when walking now.  Denies radicular symptoms, saddle paresthesia, bladder and bowel dysfunction, numbness and tingling.  Patient also endorses very mild shortness of breath that began this morning.  She denies chest pain, leg swelling, abdominal pain or swelling, nausea, vomiting and diarrhea.    Fall       Home Medications Prior to Admission medications   Medication Sig Start Date End Date Taking? Authorizing Provider  lidocaine (LIDODERM) 5 % Place 1 patch onto the skin daily. Remove & Discard patch within 12 hours or as directed by MD 11/11/21  Yes Kathe Becton R, PA-C  albuterol (PROVENTIL) (2.5 MG/3ML) 0.083% nebulizer solution Take 3 mLs (2.5 mg total) by nebulization every 6 (six) hours as needed for wheezing or shortness of breath. 08/09/20   Gerlene Fee, NP  albuterol (VENTOLIN HFA) 108 (90 Base) MCG/ACT inhaler Inhale 2 puffs into the lungs every 6 (six) hours as needed for wheezing or shortness of breath. 08/09/20   Gerlene Fee, NP  alendronate  (FOSAMAX) 70 MG tablet Take 1 tablet (70 mg total) by mouth every 7 (seven) days. Take with a full glass of water on an empty stomach. Do not lay down for at least 2 hours 04/08/21   [name], MD  apixaban (ELIQUIS) 5 MG TABS tablet Take 1 tablet (5 mg total) by mouth 2 (two) times daily. 08/09/20   Gerlene Fee, NP  ARIPiprazole (ABILIFY) 2 MG tablet Take 2 mg by mouth at bedtime. 10/07/21   [provider]  ARIPiprazole (ABILIFY) 5 MG tablet Take 5 mg by mouth at bedtime. Patient not taking: Reported on 11/11/2021 08/26/21   [provider]  atorvastatin (LIPITOR) 40 MG tablet TAKE 1 TABLET DAILY 06/10/21   [name], MD  B-D UF III MINI PEN NEEDLES 31G X 5 MM MISC Inject into the skin. 09/16/21   [provider]  carbamazepine (CARBATROL) 100 MG 12 hr capsule Take by mouth at bedtime. 08/22/20   [provider]  Continuous Blood Gluc Sensor (DEXCOM G6 SENSOR) MISC 1 Device by Does not apply route as directed. 04/29/21   Shamleffer, Melanie Crazier, MD  Continuous Blood Gluc Transmit (DEXCOM G6 TRANSMITTER) MISC 1 Device by Does not apply route as directed. 04/29/21   Shamleffer, Melanie Crazier, MD  denosumab (PROLIA) 60 MG/ML SOSY injection Inject 60 mg into the skin every 6 (six) months. 08/14/20   [name], MD  diltiazem (CARDIZEM CD) 120 MG 24  hr capsule Take 1 capsule (120 mg total) by mouth at bedtime. 09/13/20   Strader, Fransisco Hertz, PA-C  doxepin (SINEQUAN) 10 MG capsule Take 10 mg by mouth at bedtime. Patient not taking: Reported on 11/11/2021 10/07/21   [provider]  empagliflozin (JARDIANCE) 25 MG TABS tablet Take 1 tablet (25 mg total) by mouth daily before breakfast. 07/17/21   Shamleffer, Melanie Crazier, MD  escitalopram (LEXAPRO) 10 MG tablet Take 10 mg by mouth daily. 10/13/21   [provider]  Fluticasone-Salmeterol (ADVAIR DISKUS) 500-50 MCG/DOSE AEPB Inhale 1 puff into the lungs in the morning and at bedtime. 08/09/20    Gerlene Fee, NP  furosemide (LASIX) 40 MG tablet Take 1 tablet (40 mg total) by mouth every morning. TAKE 1 TABLET EVERY MORNING 12/16/20   Roxan Hockey, MD  Galcanezumab-gnlm [facility]) 120 MG/ML SOAJ INJECT INTO THE SKIN ONCE MONTHLY 10/29/21   Shawn Route, Coralee Pesa, PA-C  HYDROmorphone (DILAUDID) 2 MG tablet Take 1 tablet (2 mg total) by mouth every 6 (six) hours as needed for severe pain. 10/17/21   Bayard Hugger, NP  icosapent Ethyl (VASCEPA) 1 g capsule TAKE 2 CAPSULES TWICE A DAY WITH MEALS 10/07/21   [name], MD  insulin glargine (LANTUS SOLOSTAR) 100 UNIT/ML Solostar Pen Inject 38 Units into the skin 2 (two) times daily. 08/29/21   Shamleffer, Melanie Crazier, MD  insulin lispro (HUMALOG KWIKPEN) 100 UNIT/ML KwikPen Max Daily 50 units 08/29/21   Shamleffer, Melanie Crazier, MD  Insulin Pen Needle (PEN NEEDLES) 30G X 5 MM MISC Use to inject insulin 5 times daily 09/16/21   Shamleffer, Melanie Crazier, MD  LORazepam (ATIVAN) 1 MG tablet Take 1 tablet (1 mg total) by mouth 2 (two) times daily. And give 2 tablets by mouth at bedtime 08/09/20   Gerlene Fee, NP  meclizine (ANTIVERT) 25 MG tablet Take 1 tablet (25 mg total) by mouth 3 (three) times daily as needed for dizziness. Patient not taking: Reported on 11/11/2021 06/05/21   [name], MD  metFORMIN (GLUCOPHAGE) 500 MG tablet Take 1 tablet (500 mg total) by mouth 2 (two) times daily with a meal. TAKE 1 TABLET(500 MG) BY MOUTH TWICE DAILY AFTER A MEAL 08/29/21   Shamleffer, Melanie Crazier, MD  metoprolol succinate (TOPROL XL) 25 MG 24 hr tablet Take 1 tablet (25 mg total) by mouth in the morning and at bedtime. 08/31/20   Strader, Fransisco Hertz, PA-C  metoprolol succinate (TOPROL-XL) 100 MG 24 hr tablet TAKE 1 TABLET IN THE MORNING AND AT BEDTIME 11/11/21   Sueanne Margarita, MD  naloxone [facility]) nasal spray 4 mg/0.1 mL SMARTSIG:Both Nares 08/08/21   [provider]  NON FORMULARY Diet: _____ Regular, ___  __  NAS, ___x____Consistent Carbohydrate, _______NPO _____Other 04/24/20   [provider]  nystatin (MYCOSTATIN/NYSTOP) powder Apply 1 application. topically 3 (three) times daily. 09/03/21   Estill Dooms, NP  polyethylene glycol (MIRALAX / GLYCOLAX) 17 g packet Take 17 g by mouth daily. 04/25/20   Roxan Hockey, MD  potassium chloride SA (KLOR-CON) 20 MEQ tablet Take 1 tablet (20 mEq total) by mouth daily. 08/09/20   Gerlene Fee, NP  pregabalin (LYRICA) 200 MG capsule TAKE 1 CAPSULE(200 MG) BY MOUTH TWICE DAILY 09/04/21   Lovorn, Jinny Blossom, MD  Semaglutide (RYBELSUS) 14 MG TABS Take 1 tablet (14 mg total) by mouth daily. 08/29/21   Shamleffer, Melanie Crazier, MD  tiZANidine (ZANAFLEX) 4 MG capsule TAKE 1 CAPSULE BY MOUTH THREE TIMES DAILY  AS NEEDED FOR MUSCLE SPASMS 02/04/21   [name], MD  Vitamin D, Cholecalciferol, 25 MCG (1000 UT) TABS Take 1 tablet by mouth daily in the afternoon. 07/26/20   [provider]      Allergies    Ivp dye [iodinated contrast media], Codeine, Iodine, and Latex    Review of Systems   Review of Systems  Physical Exam Updated Vital Signs BP (!) 147/92   Pulse 64   Temp 98 F (36.7 C) (Oral)   Resp 16   Ht '5\' 2"'$  (1.575 m)   Wt 87.5 kg   SpO2 94%   BMI 35.30 kg/m  Physical Exam Vitals and nursing note reviewed.  Constitutional:      General: She is not in acute distress.    Appearance: She is not ill-appearing.  HENT:     Head: Atraumatic.  Eyes:     Extraocular Movements: Extraocular movements intact.     Conjunctiva/sclera: Conjunctivae normal.     Pupils: Pupils are equal, round, and reactive to light.  Cardiovascular:     Rate and Rhythm: Normal rate and regular rhythm.     Pulses: Normal pulses.          Radial pulses are 2+ on the right side and 2+ on the left side.       Dorsalis pedis pulses are 2+ on the right side and 2+ on the left side.     Heart sounds: No murmur heard. Pulmonary:     Effort: Pulmonary  effort is normal. No respiratory distress.     Breath sounds: Normal breath sounds.  Abdominal:     General: Abdomen is flat. There is no distension.     Palpations: Abdomen is soft.     Tenderness: There is no abdominal tenderness.     Comments: Abdomen is soft, rounded nontender to palpation.  No fluid  Musculoskeletal:        General: Normal range of motion.     Cervical back: Normal range of motion.     Right lower leg: No edema.     Left lower leg: No edema.     Comments: Tenderness at the level of C7.  Thoracic midline and lumbar midline tenderness.  No paraspinal tenderness.  She has full range of motion of the neck although triggers pain.  Good flexion and extension of the trunk.  Skin:    General: Skin is warm and dry.     Capillary Refill: Capillary refill takes less than 2 seconds.  Neurological:     General: No focal deficit present.     Mental Status: She is alert.  Psychiatric:        Mood and Affect: Mood normal.     ED Results / Procedures / Treatments   Labs (all labs ordered are listed, but only abnormal results are displayed) Labs Reviewed  CBG MONITORING, ED - Abnormal; Notable for the following components:      Result Value   Glucose-Capillary 131 (*)    All other components within normal limits    EKG None  Radiology CT Lumbar Spine Wo Contrast  Result Date: 11/11/2021 CLINICAL DATA:  Trauma, back pain EXAM: CT LUMBAR SPINE WITHOUT CONTRAST TECHNIQUE: Multidetector CT imaging of the lumbar spine was performed without intravenous contrast administration. Multiplanar CT image reconstructions were also generated. RADIATION DOSE REDUCTION: This exam was performed according to the departmental dose-optimization program which includes automated exposure control, adjustment of the mA and/or kV according to patient  size and/or use of iterative reconstruction technique. COMPARISON:  MR lumbar spine done on 04/28/2021 FINDINGS: Segmentation: There are 5  non-rib-bearing vertebrae in the lumbar region. Alignment: Alignment of posterior margins of vertebral bodies is unremarkable. Vertebrae: There is mild 10-20% decrease in height of upper endplate of body L2 vertebra which has not changed significantly. Schmorl's node is seen in the lower endplate of body of L4 vertebra. There are small pockets of air in the L4-L5 disc space, most likely due to disc degeneration. Paraspinal and other soft tissues: Unremarkable. Disc levels: There is encroachment of neural foramina by bony spurs and facet hypertrophy at L4-L5 level. IMPRESSION: Decrease in height of upper endplate of body of L2 vertebra has not changed since 04/28/2021 suggesting mild old compression fracture. No recent fracture is seen in the lumbar spine. Lumbar spondylosis with encroachment of neural foramina at L4-L5 level. Electronically Signed   By: Elmer Picker M.D.   On: 11/11/2021 13:50   CT Thoracic Spine Wo Contrast  Result Date: 11/11/2021 CLINICAL DATA:  Trauma, fall, back pain EXAM: CT THORACIC SPINE WITHOUT CONTRAST TECHNIQUE: Multidetector CT images of the thoracic were obtained using the standard protocol without intravenous contrast. RADIATION DOSE REDUCTION: This exam was performed according to the departmental dose-optimization program which includes automated exposure control, adjustment of the mA and/or kV according to patient size and/or use of iterative reconstruction technique. COMPARISON:  None Available. FINDINGS: Alignment: Alignment of posterior margins of vertebral bodies is unremarkable. Vertebrae: There is mild 10% decrease in height of upper endplate of body of T1 vertebra. There is mild decrease in height of lower endplate of body of T9 and upper endplate of body of Y60 vertebrae. There are pockets of air in the T9-T10 and T10-T11 disc spaces. There is sclerosis in the endplates adjacent to Y3-K16 and T10-T11 disc spaces. Schmorl's node is seen in the upper endplate of body  of W10 vertebra. Paraspinal and other soft tissues: Unremarkable. Disc levels: There is no significant encroachment of neural foramina. IMPRESSION: There is mild 10% decrease in height of upper endplate of body of T1 vertebra suggesting recent or old mild compression. Degenerative changes are noted in the disc spaces at T9-T10 and T10-T11 levels along with mild compression of lower endplate of body of T9 and upper endplate of body of X32 vertebrae. Findings suggest recent or old compression fractures due to trauma or disc degeneration. Please correlate with clinical symptoms, physical examination findings and consider MRI if warranted. Schmorl's node is seen in the upper endplate of body of T55 vertebra. Paraspinal soft tissues are unremarkable. Electronically Signed   By: Elmer Picker M.D.   On: 11/11/2021 13:44   DG Chest 1 View  Result Date: 11/11/2021 CLINICAL DATA:  Back pain, recent fall EXAM: CHEST  1 VIEW COMPARISON:  02/07/2021 FINDINGS: Transverse diameter of heart is increased. Coarse calcification is seen in the mitral annulus. Central pulmonary vessels are prominent. There are no signs of alveolar pulmonary edema. There is no focal pulmonary consolidation. There is no pleural effusion or pneumothorax. There is previous right shoulder arthroplasty. IMPRESSION: Cardiomegaly. Central pulmonary vessels are prominent without signs of alveolar pulmonary edema. There is no focal pulmonary consolidation. Electronically Signed   By: Elmer Picker M.D.   On: 11/11/2021 13:34   CT Cervical Spine Wo Contrast  Result Date: 11/11/2021 CLINICAL DATA:  Neck trauma EXAM: CT CERVICAL SPINE WITHOUT CONTRAST TECHNIQUE: Multidetector CT imaging of the cervical spine was performed without intravenous contrast. Multiplanar CT  image reconstructions were also generated. RADIATION DOSE REDUCTION: This exam was performed according to the departmental dose-optimization program which includes automated exposure  control, adjustment of the mA and/or kV according to patient size and/or use of iterative reconstruction technique. COMPARISON:  MRI cervical spine 04/28/2021 FINDINGS: Alignment: Reversal of the normal lordotic curvature of the cervical spine likely positional and/or degenerative. Minimal grade 1 anterolisthesis of C4 on C5. Skull base and vertebrae: No acute fracture. No primary bone lesion or focal pathologic process. Soft tissues and spinal canal: No prevertebral fluid or swelling. No visible canal hematoma. Disc levels: Mild to moderate intervertebral disc space narrowing throughout the cervical spine, most significant at C5-C6 and C6-C7. Associated uncovertebral spurring and small dorsal endplate osteophytes. Upper chest: No acute process visualized. Other: None. IMPRESSION: Degenerative changes with no acute fracture or malalignment identified. Electronically Signed   By: Ofilia Neas M.D.   On: 11/11/2021 11:34   CT Head Wo Contrast  Result Date: 11/11/2021 CLINICAL DATA:  Head trauma EXAM: CT HEAD WITHOUT CONTRAST TECHNIQUE: Contiguous axial images were obtained from the base of the skull through the vertex without intravenous contrast. RADIATION DOSE REDUCTION: This exam was performed according to the departmental dose-optimization program which includes automated exposure control, adjustment of the mA and/or kV according to patient size and/or use of iterative reconstruction technique. COMPARISON:  CT head 09/17/2020 FINDINGS: Brain: No acute intracranial hemorrhage, mass effect, or herniation. No extra-axial fluid collections. No evidence of acute territorial infarct. No hydrocephalus. Mild cortical volume loss. Mild patchy hypodensities in the periventricular and subcortical white matter, likely secondary to chronic microvascular ischemic changes. Vascular: No hyperdense vessel or unexpected calcification. Skull: Hyperostosis frontalis interna. Negative for fracture or focal lesion.  Sinuses/Orbits: No acute finding. Other: None. IMPRESSION: No acute intracranial process identified. Electronically Signed   By: Ofilia Neas M.D.   On: 11/11/2021 11:31    Procedures Procedures    Medications Ordered in ED Medications  ipratropium-albuterol (DUONEB) 0.5-2.5 (3) MG/3ML nebulizer solution 3 mL (3 mLs Nebulization Given 11/11/21 1341)  fentaNYL (SUBLIMAZE) injection 50 mcg (50 mcg Intramuscular Given 11/11/21 1337)    ED Course/ Medical Decision Making/ A&P                           Medical Decision Making Amount and/or Complexity of Data Reviewed Radiology: ordered.  Risk Prescription drug management.   Social determinants of health:  Social History   Socioeconomic History   Marital status: Married    Spouse name: Orpah Greek   Number of children: 3   Years of education: 15   Highest education level: Bachelor's degree (e.g., BA, AB, BS)  Occupational History   Occupation: Retired    Comment: marketing  Tobacco Use   Smoking status: Never   Smokeless tobacco: Never  Vaping Use   Vaping Use: Never used  Substance and Sexual Activity   Alcohol use: Not Currently    Alcohol/week: 0.0 standard drinks of alcohol   Drug use: No   Sexual activity: Yes    Partners: Male    Birth control/protection: Post-menopausal  Other Topics Concern   Not on file  Social History Narrative   Lives at home with husband.    They have 3 children who live away - [place], [place], and [place].   She is from [place] and most of her family lives there.   Her husbands's family live nearby   Right handed.   Social Determinants of  Health   Financial Resource Strain: Low Risk  (06/18/2021)   Overall Financial Resource Strain (CARDIA)    Difficulty of Paying Living Expenses: Not hard at all  Food Insecurity: No Food Insecurity (06/18/2021)   Hunger Vital Sign    Worried About Running Out of Food in the Last Year: Never true    Ran Out of Food in the Last Year: Never true   Transportation Needs: No Transportation Needs (06/18/2021)   PRAPARE - Hydrologist (Medical): No    Lack of Transportation (Non-Medical): No  Physical Activity: Inactive (06/18/2021)   Exercise Vital Sign    Days of Exercise per Week: 0 days    Minutes of Exercise per Session: 0 min  Stress: No Stress Concern Present (06/18/2021)   [place]    Feeling of Stress : Only a little  Social Connections: Socially Integrated (06/18/2021)   Social Connection and Isolation Panel [NHANES]    Frequency of Communication with Friends and Family: More than three times a week    Frequency of Social Gatherings with Friends and Family: Twice a week    Attends Religious Services: More than 4 times per year    Active Member of [employer] or Organizations: Yes    Attends Archivist Meetings: 1 to 4 times per year    Marital Status: Married  Human resources officer Violence: Not At Risk (06/18/2021)   Humiliation, Afraid, Rape, and Kick questionnaire    Fear of Current or Ex-Partner: No    Emotionally Abused: No    Physically Abused: No    Sexually Abused: No     Initial impression:  This patient presents to the ED for concern of neck and back pain after a fall that occurred 2 days ago, this involves an extensive number of treatment options, and is a complaint that carries with it a high risk of complications and morbidity.   Differentials include intracranial bleed, fracture, muscle strain, hematoma, muscle spasm.   Comorbidities affecting care:  CHF, hypertension, diabetes with neuropathy  Additional history obtained: Husband  Lab Tests  I Ordered, reviewed, and interpreted labs and EKG.  The pertinent results include:  CBG 131  Imaging Studies ordered:  I ordered imaging studies including  CT head and C-spine without acute findings Chest x-ray with stable cardiomegaly CT T-spine with mild  compression fracture of T11 with 10% height loss CT lumbar spine was stable compression fracture findings I independently visualized and interpreted imaging and I agree with the radiologist interpretation.    Cardiac Monitoring:  The patient was maintained on a cardiac monitor.  I personally viewed and interpreted the cardiac monitored which showed an underlying rhythm of: Sinus rhythm   Medicines ordered and prescription drug management:  I ordered medication including: Fentanyl 50 mcg DuoNeb Reevaluation of the patient after these medicines showed that the patient improved I have reviewed the patients home medicines and have made adjustments as needed   ED Course/Re-evaluation: 76 year old female presents ED for evaluation of neck and back pain after a fall 2 days ago.  Oxygen noted to be at 91% on room air and patient endorsing mild shortness of breath.  Vitals otherwise without significant abnormality.  Exam findings as described above.  Given her mildly low oxygen and shortness of breath, I obtained DuoNeb and chest x-ray.  Chest x-ray shows stable cardiomegaly without edema.  Recommend follow-up with PCP and monitoring.  I obtained imaging of  her head, C-spine T-spine and L-spine has new mild compression fracture at T11.  Lidocaine patch prescription provided.  Recommend follow-up with PCP and her neurosurgeon who manage her previous compression fractures. Discussed plan with my attending who agrees with management.  Disposition:  After consideration of the diagnostic results, physical exam, history and the patients response to treatment feel that the patent would benefit from discharge.   Compression fracture of T12 vertebrae: Plan and management as described above. Discharged home in good condition.  Final Clinical Impression(s) / ED Diagnoses Final diagnoses:  Compression fracture of T11 vertebra, initial encounter [facility])    Rx / DC Orders ED Discharge Orders           Ordered    lidocaine (LIDODERM) 5 %  Every 24 hours        11/11/21 1410              Tonye Pearson, [place] 11/11/21 2309    Regan Lemming, MD 11/11/21 2351

## 2021-11-11 NOTE — ED Triage Notes (Signed)
"  Went to [facility] to the doctor first and they told us to come to the ED for a CT" per pt

## 2021-11-11 NOTE — ED Notes (Signed)
Patient administered Duo x1 on RA. Patient O2 sats 95-99% throughout. Pulse ox was readjusted due to poor waveform/reading. Waveform appropriate and O2 remains 95-100% on RA.

## 2021-11-11 NOTE — Discharge Instructions (Signed)
Your CT today shows that you have a mild compression fracture of your T1 vertebrae, this is right in your middle back where you are experiencing pain.  I have given you a lidocaine patch to place over the area and you have your at home pain medications for management.  Please follow-up with your neurosurgeon for evaluation.

## 2021-11-11 NOTE — ED Triage Notes (Signed)
"  Slipped in bathroom two days ago, and hit my head on the door and wall, also bent my back and bit my tongue. Since then I have been in extreme pain in my back and my neck and have had dizziness" per pt  Denies n/v

## 2021-11-12 ENCOUNTER — Encounter (HOSPITAL_COMMUNITY): Payer: Medicare Other | Admitting: Physical Therapy

## 2021-11-12 DIAGNOSIS — B351 Tinea unguium: Secondary | ICD-10-CM | POA: Diagnosis not present

## 2021-11-12 DIAGNOSIS — M79676 Pain in unspecified toe(s): Secondary | ICD-10-CM | POA: Diagnosis not present

## 2021-11-12 DIAGNOSIS — L84 Corns and callosities: Secondary | ICD-10-CM | POA: Diagnosis not present

## 2021-11-12 DIAGNOSIS — E1142 Type 2 diabetes mellitus with diabetic polyneuropathy: Secondary | ICD-10-CM | POA: Diagnosis not present

## 2021-11-13 ENCOUNTER — Telehealth: Payer: Self-pay | Admitting: Student

## 2021-11-13 DIAGNOSIS — L708 Other acne: Secondary | ICD-10-CM | POA: Diagnosis not present

## 2021-11-13 DIAGNOSIS — D2261 Melanocytic nevi of right upper limb, including shoulder: Secondary | ICD-10-CM | POA: Diagnosis not present

## 2021-11-13 DIAGNOSIS — D225 Melanocytic nevi of trunk: Secondary | ICD-10-CM | POA: Diagnosis not present

## 2021-11-13 DIAGNOSIS — D485 Neoplasm of uncertain behavior of skin: Secondary | ICD-10-CM | POA: Diagnosis not present

## 2021-11-13 DIAGNOSIS — L818 Other specified disorders of pigmentation: Secondary | ICD-10-CM | POA: Diagnosis not present

## 2021-11-13 MED ORDER — METOPROLOL SUCCINATE ER 100 MG PO TB24: ORAL_TABLET | ORAL | 3 refills | Status: DC

## 2021-11-13 MED ORDER — METOPROLOL SUCCINATE ER 25 MG PO TB24: 25.0000 mg | ORAL_TABLET | Freq: Two times a day (BID) | ORAL | 3 refills | Status: DC

## 2021-11-13 NOTE — Telephone Encounter (Signed)
*  STAT* If patient is at the pharmacy, call can be transferred to refill team.   1. Which medications need to be refilled? (please list name of each medication and dose if known)  metoprolol succinate (TOPROL XL) 25 MG 24 hr tablet metoprolol succinate (TOPROL-XL) 100 MG 24 hr tablet  2. Which pharmacy/location (including street and city if local pharmacy) is medication to be sent to? [facility] [place], [place]  3. Do they need a 30 day or 90 day supply? 90 day supply

## 2021-11-13 NOTE — Telephone Encounter (Signed)
Completed.

## 2021-11-14 ENCOUNTER — Encounter (HOSPITAL_COMMUNITY): Payer: Medicare Other | Admitting: Physical Therapy

## 2021-11-14 ENCOUNTER — Telehealth: Payer: Self-pay | Admitting: *Deleted

## 2021-11-14 NOTE — Telephone Encounter (Signed)
Pt scheduled with Triage Nurse 11/19/21 at 3:00 for Prolia injection

## 2021-11-18 ENCOUNTER — Telehealth: Payer: Self-pay

## 2021-11-18 MED ORDER — HYDROMORPHONE HCL 2 MG PO TABS: 2.0000 mg | ORAL_TABLET | Freq: Four times a day (QID) | ORAL | 0 refills | Status: DC | PRN

## 2021-11-18 NOTE — Telephone Encounter (Signed)
Refill request for Hydromorphone. Last filled #120 on 10/17/21

## 2021-11-19 ENCOUNTER — Ambulatory Visit (INDEPENDENT_AMBULATORY_CARE_PROVIDER_SITE_OTHER): Payer: Medicare Other

## 2021-11-19 ENCOUNTER — Encounter (HOSPITAL_COMMUNITY): Payer: Medicare Other | Admitting: Physical Therapy

## 2021-11-19 DIAGNOSIS — M810 Age-related osteoporosis without current pathological fracture: Secondary | ICD-10-CM

## 2021-11-20 ENCOUNTER — Ambulatory Visit (INDEPENDENT_AMBULATORY_CARE_PROVIDER_SITE_OTHER): Payer: Medicare Other | Admitting: Adult Health

## 2021-11-20 ENCOUNTER — Encounter: Payer: Self-pay | Admitting: Adult Health

## 2021-11-20 VITALS — BP 111/81 | HR 71 | Ht 62.0 in | Wt 192.0 lb

## 2021-11-20 DIAGNOSIS — B369 Superficial mycosis, unspecified: Secondary | ICD-10-CM

## 2021-11-20 DIAGNOSIS — I2510 Atherosclerotic heart disease of native coronary artery without angina pectoris: Secondary | ICD-10-CM

## 2021-11-20 NOTE — Progress Notes (Signed)
  Subjective:     Patient ID: Amber Stephenson, female   DOB: 02-24-1946, 76 y.o.   MRN: [id number]  HPI Amber Stephenson is a 76 yea rold white female, married, PM in complaining of itching in groin area.  PCP is Dr Livia Snellen.  Review of Systems Itching in groin area Reviewed past medical,surgical, social and family history. Reviewed medications and allergies.     Objective:   Physical Exam BP 111/81 (BP Location: Right Arm, Patient Position: Sitting, Cuff Size: Normal)   Pulse 71   Ht '5\' 2"'$  (1.575 m)   Wt 192 lb (87.1 kg)   BMI 35.12 kg/m     Skin is warm and has area of redness right groin and several flat moles. Painted with gentian violet. Examination chaperoned by Celene Squibb LPN  Assessment:     1. Superficial fungus infection of skin Painted with gentian violet Keep dry, can use mens handkerchief, in crease of leg to wick moisture away, cleanse with water and pat dry, can use cool hair dryer Has diflucan at home     Plan:     Follow up prn

## 2021-11-21 ENCOUNTER — Other Ambulatory Visit: Payer: Self-pay

## 2021-11-21 ENCOUNTER — Encounter (HOSPITAL_COMMUNITY): Payer: Self-pay | Admitting: Physical Therapy

## 2021-11-21 ENCOUNTER — Ambulatory Visit (HOSPITAL_COMMUNITY): Payer: Medicare Other | Admitting: Physical Therapy

## 2021-11-21 ENCOUNTER — Emergency Department (HOSPITAL_BASED_OUTPATIENT_CLINIC_OR_DEPARTMENT_OTHER)
Admission: EM | Admit: 2021-11-21 | Discharge: 2021-11-21 | Disposition: A | Discharge status: Home / Self Care | Payer: Medicare Other | Attending: Emergency Medicine | Admitting: Emergency Medicine

## 2021-11-21 ENCOUNTER — Emergency Department (HOSPITAL_BASED_OUTPATIENT_CLINIC_OR_DEPARTMENT_OTHER): Payer: Medicare Other

## 2021-11-21 ENCOUNTER — Emergency Department (HOSPITAL_BASED_OUTPATIENT_CLINIC_OR_DEPARTMENT_OTHER): Payer: Medicare Other | Admitting: Radiology

## 2021-11-21 DIAGNOSIS — Z7984 Long term (current) use of oral hypoglycemic drugs: Secondary | ICD-10-CM | POA: Insufficient documentation

## 2021-11-21 DIAGNOSIS — M25512 Pain in left shoulder: Secondary | ICD-10-CM | POA: Insufficient documentation

## 2021-11-21 DIAGNOSIS — M542 Cervicalgia: Secondary | ICD-10-CM | POA: Diagnosis not present

## 2021-11-21 DIAGNOSIS — R062 Wheezing: Secondary | ICD-10-CM | POA: Diagnosis not present

## 2021-11-21 DIAGNOSIS — R519 Headache, unspecified: Secondary | ICD-10-CM | POA: Insufficient documentation

## 2021-11-21 DIAGNOSIS — W19XXXA Unspecified fall, initial encounter: Secondary | ICD-10-CM | POA: Diagnosis not present

## 2021-11-21 DIAGNOSIS — S0990XA Unspecified injury of head, initial encounter: Secondary | ICD-10-CM | POA: Diagnosis not present

## 2021-11-21 DIAGNOSIS — Z7901 Long term (current) use of anticoagulants: Secondary | ICD-10-CM | POA: Insufficient documentation

## 2021-11-21 DIAGNOSIS — Z794 Long term (current) use of insulin: Secondary | ICD-10-CM | POA: Diagnosis not present

## 2021-11-21 DIAGNOSIS — Z91040 Latex allergy status: Secondary | ICD-10-CM | POA: Diagnosis not present

## 2021-11-21 DIAGNOSIS — Z79899 Other long term (current) drug therapy: Secondary | ICD-10-CM | POA: Diagnosis not present

## 2021-11-21 DIAGNOSIS — R293 Abnormal posture: Secondary | ICD-10-CM

## 2021-11-21 DIAGNOSIS — S199XXA Unspecified injury of neck, initial encounter: Secondary | ICD-10-CM | POA: Diagnosis not present

## 2021-11-21 MED ORDER — IPRATROPIUM-ALBUTEROL 0.5-2.5 (3) MG/3ML IN SOLN
3.0000 mL | Freq: Once | RESPIRATORY_TRACT | Status: AC
  Administered 2021-11-21: 3 mL via RESPIRATORY_TRACT
  Filled 2021-11-21: qty 3

## 2021-11-21 NOTE — ED Notes (Signed)
Pt transported to CT at this time.

## 2021-11-21 NOTE — ED Triage Notes (Signed)
PT BIB s/o from home after sustaining a fall. States she was standing at the sink lost her balance and fell backwards hitting her head on the floor today at 3 am. Denies dizziness prior to fall. Denies LOC. C/o pain in the back of hear head and neck. Pt on Eliquis. Pt GCS 15, A&Ox4

## 2021-11-21 NOTE — ED Notes (Signed)
C collar placed in triage

## 2021-11-21 NOTE — ED Notes (Signed)
RT at bedside. Pt noted intermitted 89% on room air improved while pt at rest.

## 2021-11-21 NOTE — ED Provider Notes (Signed)
[facility] EMERGENCY DEPT  Provider Note  CSN: [id number] Arrival date & time: 11/21/21 4010  History Chief Complaint  Patient presents with   Fall    Blood Thinner    Amber Stephenson is a 76 y.o. female with an extensive PMH including afib on Eliquis reports she lost her balance and fell when she got up to urinate around 3am. She landed on her L side, hitting her head. Complaining of headache, neck pain and L shoulder pain.    Home Medications Prior to Admission medications   Medication Sig Start Date End Date Taking? Authorizing Provider  albuterol (PROVENTIL) (2.5 MG/3ML) 0.083% nebulizer solution Take 3 mLs (2.5 mg total) by nebulization every 6 (six) hours as needed for wheezing or shortness of breath. 08/09/20   Gerlene Fee, NP  albuterol (VENTOLIN HFA) 108 (90 Base) MCG/ACT inhaler Inhale 2 puffs into the lungs every 6 (six) hours as needed for wheezing or shortness of breath. 08/09/20   Gerlene Fee, NP  alendronate (FOSAMAX) 70 MG tablet Take 1 tablet (70 mg total) by mouth every 7 (seven) days. Take with a full glass of water on an empty stomach. Do not lay down for at least 2 hours 04/08/21   [name], MD  apixaban (ELIQUIS) 5 MG TABS tablet Take 1 tablet (5 mg total) by mouth 2 (two) times daily. 08/09/20   Gerlene Fee, NP  ARIPiprazole (ABILIFY) 2 MG tablet Take 2 mg by mouth at bedtime. 10/07/21   [provider]  ARIPiprazole (ABILIFY) 5 MG tablet Take 5 mg by mouth at bedtime. Patient not taking: Reported on 11/11/2021 08/26/21   [provider]  atorvastatin (LIPITOR) 40 MG tablet TAKE 1 TABLET DAILY 06/10/21   [name], MD  B-D UF III MINI PEN NEEDLES 31G X 5 MM MISC Inject into the skin. 09/16/21   [provider]  carbamazepine (CARBATROL) 100 MG 12 hr capsule Take by mouth at bedtime. 08/22/20   [provider]  Continuous Blood Gluc Sensor (DEXCOM G6 SENSOR) MISC 1 Device by Does not apply route as  directed. 04/29/21   Shamleffer, Melanie Crazier, MD  Continuous Blood Gluc Transmit (DEXCOM G6 TRANSMITTER) MISC 1 Device by Does not apply route as directed. 04/29/21   Shamleffer, Melanie Crazier, MD  denosumab (PROLIA) 60 MG/ML SOSY injection Inject 60 mg into the skin every 6 (six) months. 08/14/20   [name], MD  diltiazem (CARDIZEM CD) 120 MG 24 hr capsule Take 1 capsule (120 mg total) by mouth at bedtime. 09/13/20   Strader, Fransisco Hertz, PA-C  doxepin (SINEQUAN) 10 MG capsule Take 10 mg by mouth at bedtime. Patient not taking: Reported on 11/11/2021 10/07/21   [provider]  empagliflozin (JARDIANCE) 25 MG TABS tablet Take 1 tablet (25 mg total) by mouth daily before breakfast. 07/17/21   Shamleffer, Melanie Crazier, MD  escitalopram (LEXAPRO) 10 MG tablet Take 10 mg by mouth daily. 10/13/21   [provider]  Fluticasone-Salmeterol (ADVAIR DISKUS) 500-50 MCG/DOSE AEPB Inhale 1 puff into the lungs in the morning and at bedtime. 08/09/20   Gerlene Fee, NP  furosemide (LASIX) 40 MG tablet Take 1 tablet (40 mg total) by mouth every morning. TAKE 1 TABLET EVERY MORNING 12/16/20   Roxan Hockey, MD  Galcanezumab-gnlm [facility]) 120 MG/ML SOAJ INJECT INTO THE SKIN ONCE MONTHLY 10/29/21   Rondel Jumbo, PA-C  HYDROmorphone (DILAUDID) 2 MG tablet Take 1 tablet (2 mg total) by mouth every  6 (six) hours as needed for severe pain. 11/18/21   Lovorn, Jinny Blossom, MD  icosapent Ethyl (VASCEPA) 1 g capsule TAKE 2 CAPSULES TWICE A DAY WITH MEALS 10/07/21   [name], MD  insulin glargine (LANTUS SOLOSTAR) 100 UNIT/ML Solostar Pen Inject 38 Units into the skin 2 (two) times daily. 08/29/21   Shamleffer, Melanie Crazier, MD  insulin lispro (HUMALOG KWIKPEN) 100 UNIT/ML KwikPen Max Daily 50 units 08/29/21   Shamleffer, Melanie Crazier, MD  Insulin Pen Needle (PEN NEEDLES) 30G X 5 MM MISC Use to inject insulin 5 times daily 09/16/21   Shamleffer, Melanie Crazier, MD  lidocaine (LIDODERM) 5 %  Place 1 patch onto the skin daily. Remove & Discard patch within 12 hours or as directed by MD 11/11/21   Tonye Pearson, PA-C  LORazepam (ATIVAN) 1 MG tablet Take 1 tablet (1 mg total) by mouth 2 (two) times daily. And give 2 tablets by mouth at bedtime 08/09/20   Gerlene Fee, NP  meclizine (ANTIVERT) 25 MG tablet Take 1 tablet (25 mg total) by mouth 3 (three) times daily as needed for dizziness. Patient not taking: Reported on 11/11/2021 06/05/21   [name], MD  metFORMIN (GLUCOPHAGE) 500 MG tablet Take 1 tablet (500 mg total) by mouth 2 (two) times daily with a meal. TAKE 1 TABLET(500 MG) BY MOUTH TWICE DAILY AFTER A MEAL 08/29/21   Shamleffer, Melanie Crazier, MD  metoprolol succinate (TOPROL XL) 25 MG 24 hr tablet Take 1 tablet (25 mg total) by mouth in the morning and at bedtime. 11/13/21   Sueanne Margarita, MD  metoprolol succinate (TOPROL-XL) 100 MG 24 hr tablet TAKE 1 TABLET IN THE MORNING AND AT BEDTIME 11/13/21   Sueanne Margarita, MD  naloxone [facility]) nasal spray 4 mg/0.1 mL SMARTSIG:Both Nares 08/08/21   [provider]  NON FORMULARY Diet: _____ Regular, ___  __ NAS, ___x____Consistent Carbohydrate, _______NPO _____Other 04/24/20   [provider]  nystatin (MYCOSTATIN/NYSTOP) powder Apply 1 application. topically 3 (three) times daily. 09/03/21   Estill Dooms, NP  polyethylene glycol (MIRALAX / GLYCOLAX) 17 g packet Take 17 g by mouth daily. 04/25/20   Roxan Hockey, MD  potassium chloride SA (KLOR-CON) 20 MEQ tablet Take 1 tablet (20 mEq total) by mouth daily. 08/09/20   Gerlene Fee, NP  pregabalin (LYRICA) 200 MG capsule TAKE 1 CAPSULE(200 MG) BY MOUTH TWICE DAILY 09/04/21   Lovorn, Jinny Blossom, MD  Semaglutide (RYBELSUS) 14 MG TABS Take 1 tablet (14 mg total) by mouth daily. 08/29/21   Shamleffer, Melanie Crazier, MD  tiZANidine (ZANAFLEX) 4 MG capsule TAKE 1 CAPSULE BY MOUTH THREE TIMES DAILY AS NEEDED FOR MUSCLE SPASMS 02/04/21   [name], MD  Vitamin  D, Cholecalciferol, 25 MCG (1000 UT) TABS Take 1 tablet by mouth daily in the afternoon. 07/26/20   [provider]     Allergies    Ivp dye [iodinated contrast media], Codeine, Iodine, and Latex   Review of Systems   Review of Systems Please see HPI for pertinent positives and negatives  Physical Exam BP 128/80 (BP Location: Right Arm)   Pulse (!) 58   Temp (!) 97.5 F (36.4 C) (Oral)   Resp (!) 23   Ht '5\' 2"'$  (1.575 m)   Wt 87.1 kg   SpO2 93%   BMI 35.12 kg/m   Physical Exam Vitals and nursing note reviewed.  Constitutional:      Appearance: Normal appearance.  HENT:  Head: Normocephalic and atraumatic.     Nose: Nose normal.     Mouth/Throat:     Mouth: Mucous membranes are moist.  Eyes:     Extraocular Movements: Extraocular movements intact.     Conjunctiva/sclera: Conjunctivae normal.  Cardiovascular:     Rate and Rhythm: Normal rate.  Pulmonary:     Effort: Pulmonary effort is normal.     Breath sounds: Wheezing present.  Abdominal:     General: Abdomen is flat.     Palpations: Abdomen is soft.     Tenderness: There is no abdominal tenderness.  Musculoskeletal:        General: Tenderness (L anterior shoulder) present. No swelling or deformity. Normal range of motion.     Cervical back: Tenderness (midline C-spine) present.  Skin:    General: Skin is warm and dry.  Neurological:     General: No focal deficit present.     Mental Status: She is alert.  Psychiatric:        Mood and Affect: Mood normal.     ED Results / Procedures / Treatments   EKG None  Procedures Procedures  Medications Ordered in the ED Medications  ipratropium-albuterol (DUONEB) 0.5-2.5 (3) MG/3ML nebulizer solution 3 mL (has no administration in time range)    Initial Impression and Plan  Patient here after mechanical fall. Will send for imaging of head, cspine and L shoulder. She was also noted to be borderline hypoxic with wheezing. She uses a nebulizer at  home and states she feels like she might need a treatment.   ED Course   Clinical Course as of 11/21/21 0703  Thu Nov 21, 2021  0701 I personally viewed the images from radiology studies and agree with radiologist interpretation: Shoulder is neg for acute fracture  CT are pending. Care of the patient will be signed out to the oncoming team at the change of shift.   [CS]    Clinical Course User Index [CS] Truddie Hidden, MD     MDM Rules/Calculators/A&P Medical Decision Making Amount and/or Complexity of Data Reviewed Radiology: ordered.  Risk Prescription drug management.    Final Clinical Impression(s) / ED Diagnoses Final diagnoses:  None    Rx / DC Orders ED Discharge Orders     None        Truddie Hidden, MD 11/21/21 [phone]

## 2021-11-21 NOTE — Therapy (Signed)
OUTPATIENT PHYSICAL THERAPY CERVICAL TREATMENT   Patient Name: Amber Stephenson MRN: [id number] DOB:Apr 23, 1946, 76 y.o., female Today's Date: 11/21/2021   PT End of Session - 11/21/21 1600     Visit Number 4    Number of Visits 12    Date for PT Re-Evaluation 12/12/21    Authorization Type Medicare A; [employer] 2ndary    Progress Note Due on Visit 10    PT Start Time 1600    PT Stop Time 1640    PT Time Calculation (min) 40 min    Activity Tolerance Patient tolerated treatment well;Patient limited by pain;No increased pain    Behavior During Therapy [facility] for tasks assessed/performed              Past Medical History:  Diagnosis Date   Acute on chronic respiratory failure with hypoxia ([place]) 01/22/2019   Asthma    Atrial fibrillation with RVR 05/19/2017   Bipolar I disorder 01/23/2015   CAD (coronary artery disease)    Nonobstructive; Managed by Dr. Bronson Ing   Cardiomegaly 01/12/2018   Chronic anticoagulation 05/30/2015   Chronic atrial fibrillation ([place]) 01/04/2015   Chronic back pain    Lower back   Chronic diastolic CHF (congestive heart failure) 05/30/2015   Diabetes mellitus, type 2, without complication    Diabetic neuropathy 02/06/2016   Dysrhythmia    A-Fib   Essential hypertension    Herpes genitalis in women 07/16/2015   Hyperlipidemia    Hypoxia 02/01/2019   Insomnia 01/23/2015   Lipoma 02/08/2015   Major depressive disorder    Migraine headache with aura 02/12/2016   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   NAFL (nonalcoholic fatty liver) 60/03/9322   OSA (obstructive sleep apnea) 02/24/2019   10/09/2018 - HST  - AHI 40.6    Pulmonary hypertension    Renal insufficiency    Managed by Dr. Wallace Keller   RLS (restless legs syndrome) 04/27/2015   Past Surgical History:  Procedure Laterality Date   BREAST REDUCTION SURGERY     EYE SURGERY Right    cateracts   HAMMER TOE SURGERY     LEFT HEART CATH AND CORONARY ANGIOGRAPHY N/A 02/03/2018    Procedure: LEFT HEART CATH AND CORONARY ANGIOGRAPHY;  Surgeon: Martinique, Peter M, MD;  Location: [place] CV LAB;  Service: Cardiovascular;  Laterality: N/A;   REVERSE SHOULDER ARTHROPLASTY Right 08/19/2019   Procedure: REVERSE SHOULDER ARTHROPLASTY;  Surgeon: Netta Cedars, MD;  Location: [facility] ORS;  Service: Orthopedics;  Laterality: Right;  interscalene block   SHOULDER SURGERY Right    "I BROKE MY SHOUDLER   THIGH SURGERY     "TO REMOVE A TUMOR "   Patient Active Problem List   Diagnosis Date Noted   Myofascial pain 10/25/2021   Superficial fungus infection of skin 09/03/2021   Chronic right-sided low back pain with right-sided sciatica 07/19/2021   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   Vaginal itching 04/16/2021   Dysuria 04/16/2021   Yeast infection 01/14/2021   Type 2 diabetes mellitus without complication, with long-term current use of insulin ([place]) 12/25/2020   Acute on chronic diastolic CHF (congestive heart failure)/HFpEF Exacerbation 12/16/2020   Acute respiratory failure with hypoxia ([place]) 12/16/2020   UTI (urinary tract infection) 12/14/2020   Elevated brain natriuretic peptide (BNP) level 12/14/2020   Elevated troponin 12/14/2020   Acute exacerbation of CHF (congestive heart failure) ([place]) 12/13/2020   Chronic post-traumatic stress disorder 12/06/2020   Bipolar I disorder, most recent episode (or current) manic ([place])  12/06/2020   Tremor 12/06/2020   Senile nuclear sclerosis 12/06/2020   Senile corneal changes 12/06/2020   Psychoses ([place]) 12/06/2020   Presbyopia 12/06/2020   Postmenopausal bleeding 12/06/2020   Postcoital bleeding 12/06/2020   Pinguecula 12/06/2020   Pain in joint, shoulder region 12/06/2020   Other psychological or physical stress 12/06/2020   Other acquired hammer toe 12/06/2020   Osteopenia 12/06/2020   Opioid dependence ([place]) 12/06/2020   Non-neoplastic nevus 12/06/2020   Essential hypertriglyceridemia 12/06/2020   Hypermetropia  12/06/2020   Debility 12/06/2020   Cystitis 12/06/2020   Depressed bipolar I disorder in full remission ([place]) 12/06/2020   Benzodiazepine dependence ([place]) 12/06/2020   Benign neoplasm of skin 12/06/2020   Anxiety 12/06/2020   Functional diarrhea 10/26/2020   Diabetes mellitus due to underlying condition with hyperglycemia, with long-term current use of insulin ([place]) 09/24/2020   Dyslipidemia 09/24/2020   Head trauma 09/17/2020   Chronic respiratory failure with hypoxia ([place]) 04/25/2020   Chronic constipation 04/25/2020   Back pain/sacroiliitis--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   Fall at home, initial encounter 04/23/2020   Ambulatory dysfunction--back pain and falls 04/23/2020   Chronic pain syndrome 08/22/2019   Nerve pain 08/22/2019   Myofascial pain dysfunction syndrome 08/22/2019   Dyspnea 08/22/2019   CAD (coronary artery disease)    Hypocalcemia    S/P shoulder replacement, 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   History of adenomatous polyp of colon 05/21/2019   Polypharmacy 05/21/2019   Benign paroxysmal positional vertigo due to bilateral vestibular disorder 05/20/2019   Hyperlipidemia associated with type 2 diabetes mellitus ([place]) 05/20/2019   Vertigo 04/25/2019   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   OSA (obstructive sleep apnea) 02/24/2019   Allergic rhinitis 02/24/2019   Acute on chronic respiratory failure with hypoxia ([place]) 01/22/2019   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   Morbid obesity with alveolar hypoventilation ([place]) 07/06/2018   Obesity (BMI 30-39.9) 07/06/2018   At risk for adverse drug event 07/06/2018   Cardiomegaly 16/03/9603   Non-alcoholic fatty liver disease 01/12/2018   Mild intermittent asthma 01/12/2018   Senile osteoporosis 12/15/2017   Atrial fibrillation with RVR ([place]) 05/19/2017   Fibromyalgia 03/19/2017   Migraine headache with aura 02/12/2016   Diabetic neuropathy associated with  type 2 diabetes mellitus ([place]) 02/06/2016   Bipolar 1 disorder, manic, moderate ([place]) 10/04/2015   Depression, recurrent ([place]) 10/03/2015   Herpes genitalis in women 07/16/2015   Long term current use of anticoagulant 05/30/2015   Family history of coronary arteriosclerosis 05/30/2015   Chronic diastolic heart failure ([place]) 05/30/2015   Dyspnea on exertion    Essential hypertension    Restless legs 04/27/2015   Lipoma 02/08/2015   Bipolar disorder ([place]) 01/23/2015   Insomnia 01/23/2015   Chronic back pain 01/04/2015   Chronic atrial fibrillation ([place]) 01/04/2015   Type 2 diabetes mellitus ([place])    Hyperlipidemia     PCP: [name] MD   REFERRING PROVIDER: Kary Kos, MD   REFERRING DIAG: M54.2 (ICD-10-CM) - Neck pain   THERAPY DIAG:  Cervicalgia  Abnormal posture  Rationale for Evaluation and Treatment Rehabilitation  ONSET DATE: Chronic  SUBJECTIVE:  SUBJECTIVE STATEMENT:  Patient says she feels "rickety" today. She fell backward in her tub while stretching and hit her head. She went to ER and the scanned and released her.   PERTINENT HISTORY:  -Chronic LBP, Stage 3 Kidney disease, RT TSA, DM -Patient presents to therapy with complaint of chronic neck pain. This has been ongoing for several years, worsening over the past 2 years. No unknown mechanism of injury. She is seeing pain management. Notes chronic low back pain as well. She is currently managing symptoms with prescribed pain medication. No prior therapy for this issue. She has tried acupuncture with good result.    PAIN:  Are you having pain? Yes: NPRS scale: 8/10 Pain location: neck, upper back Pain description: "hard pain" like breaking bones, sore and stiffness Aggravating factors: sleeping  Relieving  factors: stretching, meds   PRECAUTIONS: None  WEIGHT BEARING RESTRICTIONS No  FALLS:  Has patient fallen in last 6 months? No  LIVING ENVIRONMENT: Lives with: lives with their spouse Lives in: House/apartment  OCCUPATION: Retired   PLOF: Independent with basic ADLs  PATIENT GOALS "Have less pain"  OBJECTIVE:   DIAGNOSTIC FINDINGS:  IMPRESSION: 1. Motion degraded exam. Overall grossly stable appearance of the cervical spine as compared to 04/23/2020. 2. Multilevel cervical spondylosis with small central/paracentral protrusions at C4-5 and C5-6. Secondary mild flattening of the ventral cord at these levels without cord signal changes. 3. Mild right C4 and left C5 foraminal stenosis, with mild to moderate left C7 foraminal narrowing, stable.    PATIENT SURVEYS:  FOTO 38% function    COGNITION: Overall cognitive status: Within functional limits for tasks assessed   POSTURE: rounded shoulders and forward head  PALPATION:   Moderate TTP about bilateral upper trap    CERVICAL ROM:   Active ROM A/PROM (deg) eval  Flexion 50  Extension 37  Right lateral flexion   Left lateral flexion   Right rotation 42  Left rotation 42   (Blank rows = not tested)  UPPER EXTREMITY ROM:  Mod restriction in RT shoulder elevation, min restriction in LT shoulder elevation   UPPER EXTREMITY MMT:  MMT Right eval Left eval  Shoulder flexion    Shoulder extension    Shoulder abduction    Shoulder adduction    Shoulder extension    Shoulder internal rotation    Shoulder external rotation    Middle trapezius    Lower trapezius    Elbow flexion    Elbow extension    Wrist flexion    Wrist extension    Wrist ulnar deviation    Wrist radial deviation    Wrist pronation    Wrist supination    Grip strength     (Blank rows = not tested)   TODAY'S TREATMENT:   11/21/21 Seated 3D cervical excursions x 10 each  Posterior shoulder rolls x 20  Scap retraction 15 x  5" Chin tuck 15 x 5" Upper trap stretch 3 x20" Levator stretch 3 x 20"      IASTM to bilateral upper trap and levator, patient seated    11/06/21:  Seated: 3D cervical excursion all directions in pain free range 10x each   RTB rows   Wback   Chin tuck 15x 5"   Shoulder rolls posterior (up, back and down)   Upper trap stretch R/L 2x 30" each   Levator scapula 2x 30"   Educated log rolling for back pain   Supine: STM to Bil UT L>R, levator, scalenes  2 sets x 25mn cervical traction and suboccipital release  11/04/21  Upper trap stretch b/l 10 sec holds x3 b/l   Serratus stretch b/l 10 sec holds x3 b/l   Scap rolls in seated x10  Chin tuck x15  STM to sub occipital and upper trap region  Mob to spinous process C4 with left lateral flexion following mulligan concept  Self SNAG for cervical extension and rotation following mulligan concept.     10/31/21 Scap retraction Chic tuck Upper trap stretch    PATIENT EDUCATION:  Education details:HEP updated 6/5 Person educated: Patient Education method: Explanation Education comprehension: verbalized understanding   HOME EXERCISE PROGRAM: URL: [email or web address] Access Code: [email or web address] 11/06/21 Prepared by: CIhor Austin LPTA/CLT; CBIS Wback 3x 10 5" holds    Access Code: [email or web address] URL: [email or web address] Date: 11/04/2021 Prepared by: JLeota Jacobsen Exercises - Seated Levator Scapulae Stretch  - 2 x daily - 7 x weekly - 1 sets - 3 reps - 10sec hold - Shoulder Rolls in Sitting  - 2 x daily - 7 x weekly - 2 sets - 10 reps - Seated Assisted Cervical Rotation with Towel  - 2 x daily - 7 x weekly - 1 sets - 3 reps - Upper Cervical Extension SNAG with Strap  - 2 x daily - 7 x weekly - 1 sets - 5 reps  Access Code: [email or web address]: [email or web address] Date: 10/31/2021 Prepared by: CJosue Hector Exercises - Seated Scapular Retraction  - 3 x daily - 7 x weekly - 2 sets - 10 reps  - 3 second hold - Seated Cervical Retraction  - 3 x daily - 7 x weekly - 2 sets - 10 reps - 3 second hold - Seated Upper Trapezius Stretch  - 3 x daily - 7 x weekly - 1 sets - 3 reps - 30 second hold  ASSESSMENT:  CLINICAL IMPRESSION: Graded activity per patient tolerance. Patient with increased pain per recent fall. Focused on gentle cervical mobility and isometrics. Patient noting improved symptoms following manual massage. Ongoing AROM restriction and pain at end ranges noted. Will resume activity progressions as tolerated. Patient will continue to benefit from skilled therapy services to reduce remaining deficits and improve functional ability.    OBJECTIVE IMPAIRMENTS decreased activity tolerance, decreased mobility, decreased ROM, decreased strength, hypomobility, increased fascial restrictions, increased muscle spasms, impaired flexibility, impaired UE functional use, improper body mechanics, postural dysfunction, and pain.   ACTIVITY LIMITATIONS carrying, lifting, sleeping, bathing, dressing, reach over head, hygiene/grooming, and locomotion level  PARTICIPATION LIMITATIONS: cleaning, laundry, driving, shopping, community activity, and yard work  PERSONAL FACTORS  Chronic pain Hx  are also affecting patient's functional outcome.   REHAB POTENTIAL: Good  CLINICAL DECISION MAKING: Stable/uncomplicated  EVALUATION COMPLEXITY: Low   GOALS: SHORT TERM GOALS: Target date: 11/21/2021  Patient will be independent with initial HEP and self-management strategies to improve functional outcomes Baseline:  Goal status: Ongoing   LONG TERM GOALS: Target date: 12/12/2021  Patient will be independent with advanced HEP and self-management strategies to improve functional outcomes Baseline:  Goal status: Ongoing  2.  Patient will improve FOTO score to predicted value to indicate improvement in functional outcomes Baseline: 38%  Goal status: Ongoing  3.  Patient will demo improved  bilateral cervical rotation by 10 degrees in order to improve ability to scan environment for safety and while driving. Baseline: See AROM  Goal status: Ongoing  4. Patient will report a decrease in neck pain to no more than 3/10  for improved quality of life and ability to perform UE ADLs  Baseline: 8/10 Goal status: Ongoing  PLAN: PT FREQUENCY: 2x/week  PT DURATION: 6 weeks  PLANNED INTERVENTIONS: Therapeutic exercises, Therapeutic activity, Neuromuscular re-education, Balance training, Gait training, Patient/Family education, Joint manipulation, Joint mobilization, Stair training, Aquatic Therapy, Dry Needling, Electrical stimulation, Spinal manipulation, Spinal mobilization, Cryotherapy, Moist heat, scar mobilization, Taping, Traction, Ultrasound, Biofeedback, Ionotophoresis '4mg'$ /ml Dexamethasone, and Manual therapy.   PLAN FOR NEXT SESSION: Progress postural strength, improve cervical and shoulder AROM.    4:00 PM, 11/21/21 Josue Hector PT DPT  Physical Therapist with [facility]  [phone]

## 2021-11-22 ENCOUNTER — Encounter: Payer: Self-pay | Admitting: Internal Medicine

## 2021-11-22 ENCOUNTER — Ambulatory Visit (INDEPENDENT_AMBULATORY_CARE_PROVIDER_SITE_OTHER): Payer: Medicare Other | Admitting: Internal Medicine

## 2021-11-22 ENCOUNTER — Telehealth (HOSPITAL_COMMUNITY): Payer: Self-pay | Admitting: Physical Therapy

## 2021-11-22 VITALS — BP 130/82 | HR 88 | Ht 62.0 in | Wt 189.0 lb

## 2021-11-22 DIAGNOSIS — E119 Type 2 diabetes mellitus without complications: Secondary | ICD-10-CM

## 2021-11-22 DIAGNOSIS — Z794 Long term (current) use of insulin: Secondary | ICD-10-CM

## 2021-11-22 DIAGNOSIS — L304 Erythema intertrigo: Secondary | ICD-10-CM

## 2021-11-22 LAB — POCT GLYCOSYLATED HEMOGLOBIN (HGB A1C): Hemoglobin A1C: 7.3 % — AB (ref 4.0–5.6)

## 2021-11-22 MED ORDER — DEXCOM G7 SENSOR MISC: 1.0000 | 3 refills | Status: DC

## 2021-11-22 MED ORDER — NYSTATIN 100000 UNIT/GM EX POWD: 1.0000 | Freq: Three times a day (TID) | CUTANEOUS | 0 refills | Status: DC

## 2021-11-22 NOTE — Telephone Encounter (Signed)
called pt back on 6/23 l/m and our phone number patient did not say what she needed on the voicemail.

## 2021-11-26 ENCOUNTER — Ambulatory Visit (INDEPENDENT_AMBULATORY_CARE_PROVIDER_SITE_OTHER): Payer: Medicare Other | Admitting: Nurse Practitioner

## 2021-11-26 ENCOUNTER — Ambulatory Visit (HOSPITAL_COMMUNITY): Payer: Medicare Other | Admitting: Physical Therapy

## 2021-11-26 ENCOUNTER — Encounter (HOSPITAL_COMMUNITY): Payer: Self-pay | Admitting: Physical Therapy

## 2021-11-26 ENCOUNTER — Encounter: Payer: Self-pay | Admitting: Nurse Practitioner

## 2021-11-26 VITALS — BP 148/117 | HR 62 | Ht 62.0 in | Wt 189.0 lb

## 2021-11-26 DIAGNOSIS — R293 Abnormal posture: Secondary | ICD-10-CM | POA: Diagnosis not present

## 2021-11-26 DIAGNOSIS — M6281 Muscle weakness (generalized): Secondary | ICD-10-CM | POA: Diagnosis not present

## 2021-11-26 DIAGNOSIS — R2689 Other abnormalities of gait and mobility: Secondary | ICD-10-CM | POA: Diagnosis not present

## 2021-11-26 DIAGNOSIS — R300 Dysuria: Secondary | ICD-10-CM | POA: Diagnosis not present

## 2021-11-26 DIAGNOSIS — G8929 Other chronic pain: Secondary | ICD-10-CM | POA: Diagnosis not present

## 2021-11-26 DIAGNOSIS — M542 Cervicalgia: Secondary | ICD-10-CM

## 2021-11-26 DIAGNOSIS — M545 Low back pain, unspecified: Secondary | ICD-10-CM | POA: Diagnosis not present

## 2021-11-26 LAB — URINALYSIS, COMPLETE
Bilirubin, UA: NEGATIVE
Ketones, UA: NEGATIVE
Nitrite, UA: NEGATIVE
Protein,UA: NEGATIVE
RBC, UA: NEGATIVE
Specific Gravity, UA: 1.01 (ref 1.005–1.030)
Urobilinogen, Ur: 0.2 mg/dL (ref 0.2–1.0)
pH, UA: 5.5 (ref 5.0–7.5)

## 2021-11-26 LAB — MICROSCOPIC EXAMINATION: Renal Epithel, UA: NONE SEEN /hpf

## 2021-11-26 MED ORDER — CEPHALEXIN 500 MG PO CAPS: 500.0000 mg | ORAL_CAPSULE | Freq: Two times a day (BID) | ORAL | 0 refills | Status: DC

## 2021-11-26 NOTE — Progress Notes (Signed)
Acute Office Visit  Subjective:     Patient ID: Amber Stephenson, female    DOB: 05-22-1946, 76 y.o.   MRN: [id number]  Chief Complaint  Patient presents with   Urinary Tract Infection    Been going on for a bout a week now , states she has no burning just going a lot    Shortness of Breath   Fatigue    Urinary Tract Infection  This is a new problem. The current episode started yesterday. The problem occurs every urination. The problem has been unchanged. The quality of the pain is described as aching and burning. The pain is moderate. There has been no fever. She is Not sexually active. There is No history of pyelonephritis. Associated symptoms include flank pain. Pertinent negatives include no chills, discharge, hematuria or nausea. She has tried nothing for the symptoms.    Review of Systems  Constitutional: Negative.  Negative for chills.  HENT: Negative.    Respiratory: Negative.    Cardiovascular: Negative.   Gastrointestinal:  Negative for nausea.  Genitourinary:  Positive for flank pain. Negative for hematuria.  Musculoskeletal:  Positive for back pain.  Skin: Negative.  Negative for rash.  Psychiatric/Behavioral: Negative.    All other systems reviewed and are negative.       Objective:    BP (!) 148/117   Pulse 62   Ht 5\' 2"  (1.575 m)   Wt 189 lb (85.7 kg)   SpO2 90%   BMI 34.57 kg/m  BP Readings from Last 3 Encounters:  11/26/21 (!) 148/117  11/22/21 130/82  11/21/21 125/71   Wt Readings from Last 3 Encounters:  11/26/21 189 lb (85.7 kg)  11/22/21 189 lb (85.7 kg)  11/21/21 192 lb (87.1 kg)      Physical Exam Vitals and nursing note reviewed.  Constitutional:      Appearance: She is obese.  HENT:     Head: Normocephalic.     Nose: Nose normal.     Mouth/Throat:     Mouth: Mucous membranes are moist.     Pharynx: Oropharynx is clear.  Eyes:     Conjunctiva/sclera: Conjunctivae normal.  Cardiovascular:     Rate and Rhythm: Normal rate and  regular rhythm.  Pulmonary:     Effort: Pulmonary effort is normal.     Breath sounds: Normal breath sounds.  Abdominal:     General: Bowel sounds are normal.     Tenderness: There is left CVA tenderness.  Skin:    General: Skin is warm.     Findings: No rash.  Neurological:     General: No focal deficit present.     Mental Status: She is alert and oriented to person, place, and time.  Psychiatric:        Mood and Affect: Mood normal.        Behavior: Behavior normal.     No results found for any visits on 11/26/21.      Assessment & Plan:    Appears well, in no apparent distress.  Vital signs are normal. The abdomen is soft without tenderness, guarding, mass, rebound or organomegaly. No CVA tenderness or inguinal adenopathy noted. Urine dipstick shows positive for leukocytes.  Micro exam: few+ bacteria.   UTI with evidence of pyelonephritis  PLAN: Treatment per orders - also push fluids, may use Pyridium OTC prn. Call or return to clinic prn if these symptoms worsen or fail to improve as anticipated.   Problem List Items Addressed  This Visit       Other   Dysuria - Primary   Relevant Medications   cephALEXin (KEFLEX) 500 MG capsule   Other Relevant Orders   Urinalysis, Complete   Urine Culture    Meds ordered this encounter  Medications   cephALEXin (KEFLEX) 500 MG capsule    Sig: Take 1 capsule (500 mg total) by mouth 2 (two) times daily.    Dispense:  14 capsule    Refill:  0    Order Specific Question:   Supervising Provider    Answer:   Mechele Claude [[id number]]    Return if symptoms worsen or fail to improve.  Daryll Drown, NP

## 2021-11-27 DIAGNOSIS — H5010 Unspecified exotropia: Secondary | ICD-10-CM | POA: Diagnosis not present

## 2021-11-27 DIAGNOSIS — E119 Type 2 diabetes mellitus without complications: Secondary | ICD-10-CM | POA: Diagnosis not present

## 2021-11-27 DIAGNOSIS — H524 Presbyopia: Secondary | ICD-10-CM | POA: Diagnosis not present

## 2021-11-27 LAB — HM DIABETES EYE EXAM

## 2021-11-28 ENCOUNTER — Encounter (HOSPITAL_COMMUNITY): Payer: Self-pay

## 2021-11-28 ENCOUNTER — Ambulatory Visit (HOSPITAL_COMMUNITY): Payer: Medicare Other

## 2021-11-28 DIAGNOSIS — M6281 Muscle weakness (generalized): Secondary | ICD-10-CM | POA: Diagnosis not present

## 2021-11-28 DIAGNOSIS — R2689 Other abnormalities of gait and mobility: Secondary | ICD-10-CM

## 2021-11-28 DIAGNOSIS — M545 Low back pain, unspecified: Secondary | ICD-10-CM | POA: Diagnosis not present

## 2021-11-28 DIAGNOSIS — R293 Abnormal posture: Secondary | ICD-10-CM

## 2021-11-28 DIAGNOSIS — G8929 Other chronic pain: Secondary | ICD-10-CM | POA: Diagnosis not present

## 2021-11-28 DIAGNOSIS — M542 Cervicalgia: Secondary | ICD-10-CM | POA: Diagnosis not present

## 2021-11-28 LAB — URINE CULTURE

## 2021-11-28 NOTE — Therapy (Signed)
OUTPATIENT PHYSICAL THERAPY CERVICAL TREATMENT   Patient Name: Amber Stephenson MRN: [id number] DOB:02/16/1946, 75 y.o., female Today's Date: 11/28/2021   PT End of Session - 11/28/21 1517     Visit Number 6    Number of Visits 12    Date for PT Re-Evaluation 12/12/21    Authorization Type Medicare A; [employer] 2ndary    Progress Note Due on Visit 10    PT Start Time 1517    PT Stop Time 1600    PT Time Calculation (min) 43 min    Activity Tolerance Patient tolerated treatment well;Patient limited by pain    Behavior During Therapy [facility] for tasks assessed/performed              Past Medical History:  Diagnosis Date   Acute on chronic respiratory failure with hypoxia ([place]) 01/22/2019   Asthma    Atrial fibrillation with RVR 05/19/2017   Bipolar I disorder 01/23/2015   CAD (coronary artery disease)    Nonobstructive; Managed by Dr. Bronson Ing   Cardiomegaly 01/12/2018   Chronic anticoagulation 05/30/2015   Chronic atrial fibrillation ([place]) 01/04/2015   Chronic back pain    Lower back   Chronic diastolic CHF (congestive heart failure) 05/30/2015   Diabetes mellitus, type 2, without complication    Diabetic neuropathy 02/06/2016   Dysrhythmia    A-Fib   Essential hypertension    Herpes genitalis in women 07/16/2015   Hyperlipidemia    Hypoxia 02/01/2019   Insomnia 01/23/2015   Lipoma 02/08/2015   Major depressive disorder    Migraine headache with aura 02/12/2016   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   NAFL (nonalcoholic fatty liver) 52/77/8242   OSA (obstructive sleep apnea) 02/24/2019   10/09/2018 - HST  - AHI 40.6    Pulmonary hypertension    Renal insufficiency    Managed by Dr. Wallace Keller   RLS (restless legs syndrome) 04/27/2015   Past Surgical History:  Procedure Laterality Date   BREAST REDUCTION SURGERY     EYE SURGERY Right    cateracts   HAMMER TOE SURGERY     LEFT HEART CATH AND CORONARY ANGIOGRAPHY N/A 02/03/2018   Procedure: LEFT HEART  CATH AND CORONARY ANGIOGRAPHY;  Surgeon: Martinique, Peter M, MD;  Location: [facility];  Service: Cardiovascular;  Laterality: N/A;   REVERSE SHOULDER ARTHROPLASTY Right 08/19/2019   Procedure: REVERSE SHOULDER ARTHROPLASTY;  Surgeon: Netta Cedars, MD;  Location: [facility];  Service: Orthopedics;  Laterality: Right;  interscalene block   SHOULDER SURGERY Right    "I BROKE MY SHOUDLER   THIGH SURGERY     "TO REMOVE A TUMOR "   Patient Active Problem List   Diagnosis Date Noted   Myofascial pain 10/25/2021   Superficial fungus infection of skin 09/03/2021   Chronic right-sided low back pain with right-sided sciatica 07/19/2021   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   Vaginal itching 04/16/2021   Dysuria 04/16/2021   Yeast infection 01/14/2021   Type 2 diabetes mellitus without complication, with long-term current use of insulin ([place]) 12/25/2020   Acute on chronic diastolic CHF (congestive heart failure)/HFpEF Exacerbation 12/16/2020   Acute respiratory failure with hypoxia ([place]) 12/16/2020   UTI (urinary tract infection) 12/14/2020   Elevated brain natriuretic peptide (BNP) level 12/14/2020   Elevated troponin 12/14/2020   Acute exacerbation of CHF (congestive heart failure) ([place]) 12/13/2020   Chronic post-traumatic stress disorder 12/06/2020   Bipolar I disorder, most recent episode (or current) manic ([place]) 12/06/2020  Tremor 12/06/2020   Senile nuclear sclerosis 12/06/2020   Senile corneal changes 12/06/2020   Psychoses ([place]) 12/06/2020   Presbyopia 12/06/2020   Postmenopausal bleeding 12/06/2020   Postcoital bleeding 12/06/2020   Pinguecula 12/06/2020   Pain in joint, shoulder region 12/06/2020   Other psychological or physical stress 12/06/2020   Other acquired hammer toe 12/06/2020   Osteopenia 12/06/2020   Opioid dependence ([place]) 12/06/2020   Non-neoplastic nevus 12/06/2020   Essential hypertriglyceridemia 12/06/2020   Hypermetropia 12/06/2020   Debility  12/06/2020   Cystitis 12/06/2020   Depressed bipolar I disorder in full remission ([place]) 12/06/2020   Benzodiazepine dependence ([place]) 12/06/2020   Benign neoplasm of skin 12/06/2020   Anxiety 12/06/2020   Functional diarrhea 10/26/2020   Diabetes mellitus due to underlying condition with hyperglycemia, with long-term current use of insulin ([place]) 09/24/2020   Dyslipidemia 09/24/2020   Head trauma 09/17/2020   Chronic respiratory failure with hypoxia ([place]) 04/25/2020   Chronic constipation 04/25/2020   Back pain/sacroiliitis--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   Fall at home, initial encounter 04/23/2020   Ambulatory dysfunction--back pain and falls 04/23/2020   Chronic pain syndrome 08/22/2019   Nerve pain 08/22/2019   Myofascial pain dysfunction syndrome 08/22/2019   Dyspnea 08/22/2019   CAD (coronary artery disease)    Hypocalcemia    S/P shoulder replacement, 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   History of adenomatous polyp of colon 05/21/2019   Polypharmacy 05/21/2019   Benign paroxysmal positional vertigo due to bilateral vestibular disorder 05/20/2019   Hyperlipidemia associated with type 2 diabetes mellitus ([place]) 05/20/2019   Vertigo 04/25/2019   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   OSA (obstructive sleep apnea) 02/24/2019   Allergic rhinitis 02/24/2019   Acute on chronic respiratory failure with hypoxia ([place]) 01/22/2019   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   Morbid obesity with alveolar hypoventilation ([place]) 07/06/2018   Obesity (BMI 30-39.9) 07/06/2018   At risk for adverse drug event 07/06/2018   Cardiomegaly 24/02/7352   Non-alcoholic fatty liver disease 01/12/2018   Mild intermittent asthma 01/12/2018   Senile osteoporosis 12/15/2017   Atrial fibrillation with RVR ([place]) 05/19/2017   Fibromyalgia 03/19/2017   Migraine headache with aura 02/12/2016   Diabetic neuropathy associated with type 2 diabetes mellitus  ([place]) 02/06/2016   Bipolar 1 disorder, manic, moderate ([place]) 10/04/2015   Depression, recurrent ([place]) 10/03/2015   Herpes genitalis in women 07/16/2015   Long term current use of anticoagulant 05/30/2015   Family history of coronary arteriosclerosis 05/30/2015   Chronic diastolic heart failure ([place]) 05/30/2015   Dyspnea on exertion    Essential hypertension    Restless legs 04/27/2015   Lipoma 02/08/2015   Bipolar disorder ([place]) 01/23/2015   Insomnia 01/23/2015   Chronic back pain 01/04/2015   Chronic atrial fibrillation ([place]) 01/04/2015   Type 2 diabetes mellitus ([place])    Hyperlipidemia     PCP: [name] MD   REFERRING PROVIDER: Kary Kos, MD   REFERRING DIAG: M54.2 (ICD-10-CM) - Neck pain   THERAPY DIAG:  Cervicalgia  Abnormal posture  Chronic bilateral low back pain without sciatica  Muscle weakness (generalized)  Other abnormalities of gait and mobility  Rationale for Evaluation and Treatment Rehabilitation  ONSET DATE: Chronic  SUBJECTIVE:  SUBJECTIVE STATEMENT:  Patient reports that she had to take a tizanidine this morning, as she her muscles and neck were very tight. Pt reports that back is in more pain today.   PERTINENT HISTORY:  -Chronic LBP, Stage 3 Kidney disease, RT TSA, DM -Patient presents to therapy with complaint of chronic neck pain. This has been ongoing for several years, worsening over the past 2 years. No unknown mechanism of injury. She is seeing pain management. Notes chronic low back pain as well. She is currently managing symptoms with prescribed pain medication. No prior therapy for this issue. She has tried acupuncture with good result.    PAIN:  Are you having pain? Yes: NPRS scale: 7/10 Pain location: neck, upper back Pain  description: "hard pain" like breaking bones, sore and stiffness Aggravating factors: sleeping  Relieving factors: stretching, meds   PRECAUTIONS: None  WEIGHT BEARING RESTRICTIONS No  FALLS:  Has patient fallen in last 6 months? No  LIVING ENVIRONMENT: Lives with: lives with their spouse Lives in: House/apartment  OCCUPATION: Retired   PLOF: Independent with basic ADLs  PATIENT GOALS "Have less pain"  OBJECTIVE:   DIAGNOSTIC FINDINGS:  IMPRESSION: 1. Motion degraded exam. Overall grossly stable appearance of the cervical spine as compared to 04/23/2020. 2. Multilevel cervical spondylosis with small central/paracentral protrusions at C4-5 and C5-6. Secondary mild flattening of the ventral cord at these levels without cord signal changes. 3. Mild right C4 and left C5 foraminal stenosis, with mild to moderate left C7 foraminal narrowing, stable.    PATIENT SURVEYS:  FOTO 38% function    COGNITION: Overall cognitive status: Within functional limits for tasks assessed   POSTURE: rounded shoulders and forward head  PALPATION:   Moderate TTP about bilateral upper trap    CERVICAL ROM:   Active ROM A/PROM (deg) eval  Flexion 50  Extension 37  Right lateral flexion   Left lateral flexion   Right rotation 42  Left rotation 42   (Blank rows = not tested)  UPPER EXTREMITY ROM:  Mod restriction in RT shoulder elevation, min restriction in LT shoulder elevation   UPPER EXTREMITY MMT:  MMT Right eval Left eval  Shoulder flexion    Shoulder extension    Shoulder abduction    Shoulder adduction    Shoulder extension    Shoulder internal rotation    Shoulder external rotation    Middle trapezius    Lower trapezius    Elbow flexion    Elbow extension    Wrist flexion    Wrist extension    Wrist ulnar deviation    Wrist radial deviation    Wrist pronation    Wrist supination    Grip strength     (Blank rows = not tested)   TODAY'S TREATMENT:   11/28/21 Upper trap stretch 10 sec holds x3 Levator stretch 10 sec x3 Mob to spinous process C4 with left lateral flexion following mulligan concept Self SNAG for cervical extension and rotation following mulligan concept. X10 each STM to sub occipital and upper trap region Seated cervical retractions x15  Seated posterior shoulder rolls x8 reps Seated physioball forward rolls x5   11/26/21 3D Cervical excursions x10 each  Posterior shoulder rolls x 20  Scap retraction 15 x 5" Chin tuck 15 x 5" Upper trap stretch 3 x20" Levator stretch 3 x 20"  W back 15 x 5"     IASTM to bilateral upper trap and levator, patient seated   11/21/21 Seated 3D cervical  excursions x 10 each  Posterior shoulder rolls x 20  Scap retraction 15 x 5" Chin tuck 15 x 5" Upper trap stretch 3 x20" Levator stretch 3 x 20"      IASTM to bilateral upper trap and levator, patient seated    11/06/21:  Seated: 3D cervical excursion all directions in pain free range 10x each   RTB rows   Wback   Chin tuck 15x 5"   Shoulder rolls posterior (up, back and down)   Upper trap stretch R/L 2x 30" each   Levator scapula 2x 30"   Educated log rolling for back pain   Supine: STM to Bil UT L>R, levator, scalenes    2 sets x 57mn cervical traction and suboccipital release  11/04/21  Upper trap stretch b/l 10 sec holds x3 b/l   Serratus stretch b/l 10 sec holds x3 b/l   Scap rolls in seated x10  Chin tuck x15  STM to sub occipital and upper trap region  Mob to spinous process C4 with left lateral flexion following mulligan concept  Self SNAG for cervical extension and rotation following mulligan concept.     10/31/21 Scap retraction Chic tuck Upper trap stretch    PATIENT EDUCATION:  Education details:HEP updated 6/5 Person educated: Patient Education method: Explanation Education comprehension: verbalized understanding   HOME EXERCISE PROGRAM: URL: [email or web address] Access Code: [email or web address]  11/06/21 Prepared by: CIhor Austin LPTA/CLT; CBIS Wback 3x 10 5" holds    Access Code: [email or web address] URL: [email or web address] Date: 11/04/2021 Prepared by: JLeota Jacobsen Exercises - Seated Levator Scapulae Stretch  - 2 x daily - 7 x weekly - 1 sets - 3 reps - 10sec hold - Shoulder Rolls in Sitting  - 2 x daily - 7 x weekly - 2 sets - 10 reps - Seated Assisted Cervical Rotation with Towel  - 2 x daily - 7 x weekly - 1 sets - 3 reps - Upper Cervical Extension SNAG with Strap  - 2 x daily - 7 x weekly - 1 sets - 5 reps  Access Code: [email or web address] Date: 10/31/2021 Prepared by: CJosue Hector Exercises - Seated Scapular Retraction  - 3 x daily - 7 x weekly - 2 sets - 10 reps - 3 second hold - Seated Cervical Retraction  - 3 x daily - 7 x weekly - 2 sets - 10 reps - 3 second hold - Seated Upper Trapezius Stretch  - 3 x daily - 7 x weekly - 1 sets - 3 reps - 30 second hold  ASSESSMENT:  CLINICAL IMPRESSION: Patient continues to be pain limited with activity. Patient instructed to perform stretches and return from stretches in slowed manner. Patient reports improvement in symptoms with self SNAG and Manual NAGS to c-spine.  Reports improved symptoms following manual. Incorporated seated Physioball forward stretch as pt twinges in pain upon going to stand and leave. Will continue to progress activity as tolerated for improved cervical ROM and decreased pain levels.    OBJECTIVE IMPAIRMENTS decreased activity tolerance, decreased mobility, decreased ROM, decreased strength, hypomobility, increased fascial restrictions, increased muscle spasms, impaired flexibility, impaired UE functional use, improper body mechanics, postural dysfunction, and pain.   ACTIVITY LIMITATIONS carrying, lifting, sleeping, bathing, dressing, reach over head, hygiene/grooming, and locomotion level  PARTICIPATION LIMITATIONS: cleaning, laundry, driving, shopping,  community activity, and yard work  PERSONAL FACTORS  Chronic pain Hx  are also affecting patient's functional outcome.   REHAB POTENTIAL: Good  CLINICAL DECISION MAKING: Stable/uncomplicated  EVALUATION COMPLEXITY: Low   GOALS: SHORT TERM GOALS: Target date: 11/21/2021  Patient will be independent with initial HEP and self-management strategies to improve functional outcomes Baseline:  Goal status: Ongoing   LONG TERM GOALS: Target date: 12/12/2021  Patient will be independent with advanced HEP and self-management strategies to improve functional outcomes Baseline:  Goal status: Ongoing  2.  Patient will improve FOTO score to predicted value to indicate improvement in functional outcomes Baseline: 38%  Goal status: Ongoing  3.  Patient will demo improved bilateral cervical rotation by 10 degrees in order to improve ability to scan environment for safety and while driving. Baseline: See AROM  Goal status: Ongoing  4. Patient will report a decrease in neck pain to no more than 3/10 for improved quality of life and ability to perform UE ADLs  Baseline: 8/10 Goal status: Ongoing  PLAN: PT FREQUENCY: 2x/week  PT DURATION: 6 weeks  PLANNED INTERVENTIONS: Therapeutic exercises, Therapeutic activity, Neuromuscular re-education, Balance training, Gait training, Patient/Family education, Joint manipulation, Joint mobilization, Stair training, Aquatic Therapy, Dry Needling, Electrical stimulation, Spinal manipulation, Spinal mobilization, Cryotherapy, Moist heat, scar mobilization, Taping, Traction, Ultrasound, Biofeedback, Ionotophoresis '4mg'$ /ml Dexamethasone, and Manual therapy.   PLAN FOR NEXT SESSION: Progress postural strength, improve cervical and shoulder AROM.    4:58 PM, 11/28/21 Merric Yost PT, DPT

## 2021-12-02 ENCOUNTER — Telehealth: Payer: Self-pay | Admitting: Family Medicine

## 2021-12-02 ENCOUNTER — Other Ambulatory Visit: Payer: Self-pay | Admitting: Nurse Practitioner

## 2021-12-02 MED ORDER — CEFDINIR 300 MG PO CAPS: 300.0000 mg | ORAL_CAPSULE | Freq: Two times a day (BID) | ORAL | 0 refills | Status: DC

## 2021-12-02 NOTE — Telephone Encounter (Signed)
Please call husband's C# [phone]

## 2021-12-02 NOTE — Telephone Encounter (Signed)
Patient informed we will call once we hear back from provider

## 2021-12-02 NOTE — Telephone Encounter (Signed)
Patient's husband aware °

## 2021-12-02 NOTE — Telephone Encounter (Signed)
  Incoming Patient Call  12/02/2021  What symptoms do you have? Frequent urination  How long have you been sick? Week  Have you been seen for this problem? YES 6-27  If your provider decides to give you a prescription, which pharmacy would you like for it to be sent to? [facility] on [address] in [place]   Patient informed that this information will be sent to the clinical staff for review and that they should receive a follow up call.

## 2021-12-02 NOTE — Telephone Encounter (Signed)
Patient completed 7 days keflex, her urine culture was negative for UTI. I will send [place] another antibiotics because we are closed tomorrow to treat the symptoms if that doesn't work she may return for further evaluation.

## 2021-12-04 ENCOUNTER — Other Ambulatory Visit: Payer: Self-pay

## 2021-12-04 ENCOUNTER — Ambulatory Visit (HOSPITAL_COMMUNITY): Payer: Medicare Other | Attending: Neurosurgery

## 2021-12-04 DIAGNOSIS — R2681 Unsteadiness on feet: Secondary | ICD-10-CM | POA: Diagnosis not present

## 2021-12-04 DIAGNOSIS — M545 Low back pain, unspecified: Secondary | ICD-10-CM | POA: Insufficient documentation

## 2021-12-04 DIAGNOSIS — M6281 Muscle weakness (generalized): Secondary | ICD-10-CM | POA: Insufficient documentation

## 2021-12-04 DIAGNOSIS — M542 Cervicalgia: Secondary | ICD-10-CM | POA: Insufficient documentation

## 2021-12-04 DIAGNOSIS — G8929 Other chronic pain: Secondary | ICD-10-CM | POA: Insufficient documentation

## 2021-12-04 DIAGNOSIS — R293 Abnormal posture: Secondary | ICD-10-CM | POA: Diagnosis not present

## 2021-12-04 DIAGNOSIS — R2689 Other abnormalities of gait and mobility: Secondary | ICD-10-CM | POA: Diagnosis not present

## 2021-12-04 MED ORDER — PEN NEEDLES 30G X 5 MM MISC: 2 refills | Status: DC

## 2021-12-04 NOTE — Therapy (Signed)
OUTPATIENT PHYSICAL THERAPY CERVICAL TREATMENT   Patient Name: Amber Stephenson MRN: [id number] DOB:05/01/1946, 76 y.o., female Today's Date: 12/04/2021   PT End of Session - 12/04/21 1505     Visit Number 7    Number of Visits 12    Date for PT Re-Evaluation 12/12/21    Authorization Type Medicare A; [employer]ary    Progress Note Due on Visit 10    PT Start Time 1510    PT Stop Time 1549    PT Time Calculation (min) 39 min    Activity Tolerance Patient tolerated treatment well;Patient limited by pain    Behavior During Therapy [facility] for tasks assessed/performed               Past Medical History:  Diagnosis Date   Acute on chronic respiratory failure with hypoxia ([place]) 01/22/2019   Asthma    Atrial fibrillation with RVR 05/19/2017   Bipolar I disorder 01/23/2015   CAD (coronary artery disease)    Nonobstructive; Managed by Dr. Bronson Ing   Cardiomegaly 01/12/2018   Chronic anticoagulation 05/30/2015   Chronic atrial fibrillation ([place]) 01/04/2015   Chronic back pain    Lower back   Chronic diastolic CHF (congestive heart failure) 05/30/2015   Diabetes mellitus, type 2, without complication    Diabetic neuropathy 02/06/2016   Dysrhythmia    A-Fib   Essential hypertension    Herpes genitalis in women 07/16/2015   Hyperlipidemia    Hypoxia 02/01/2019   Insomnia 01/23/2015   Lipoma 02/08/2015   Major depressive disorder    Migraine headache with aura 02/12/2016   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   NAFL (nonalcoholic fatty liver) 62/83/6629   OSA (obstructive sleep apnea) 02/24/2019   10/09/2018 - HST  - AHI 40.6    Pulmonary hypertension    Renal insufficiency    Managed by Dr. Wallace Keller   RLS (restless legs syndrome) 04/27/2015   Past Surgical History:  Procedure Laterality Date   BREAST REDUCTION SURGERY     EYE SURGERY Right    cateracts   HAMMER TOE SURGERY     LEFT HEART CATH AND CORONARY ANGIOGRAPHY N/A 02/03/2018   Procedure: LEFT HEART  CATH AND CORONARY ANGIOGRAPHY;  Surgeon: Martinique, Peter M, MD;  Location: [place] [facility];  Service: Cardiovascular;  Laterality: N/A;   REVERSE SHOULDER ARTHROPLASTY Right 08/19/2019   Procedure: REVERSE SHOULDER ARTHROPLASTY;  Surgeon: Netta Cedars, MD;  Location: [facility];  Service: Orthopedics;  Laterality: Right;  interscalene block   SHOULDER SURGERY Right    "I BROKE MY SHOUDLER   THIGH SURGERY     "TO REMOVE A TUMOR "   Patient Active Problem List   Diagnosis Date Noted   Myofascial pain 10/25/2021   Superficial fungus infection of skin 09/03/2021   Chronic right-sided low back pain with right-sided sciatica 07/19/2021   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   Vaginal itching 04/16/2021   Dysuria 04/16/2021   Yeast infection 01/14/2021   Type 2 diabetes mellitus without complication, with long-term current use of insulin ([place]) 12/25/2020   Acute on chronic diastolic CHF (congestive heart failure)/HFpEF Exacerbation 12/16/2020   Acute respiratory failure with hypoxia ([place]) 12/16/2020   UTI (urinary tract infection) 12/14/2020   Elevated brain natriuretic peptide (BNP) level 12/14/2020   Elevated troponin 12/14/2020   Acute exacerbation of CHF (congestive heart failure) ([place]) 12/13/2020   Chronic post-traumatic stress disorder 12/06/2020   Bipolar I disorder, most recent episode (or current) manic ([place]) 12/06/2020  Tremor 12/06/2020   Senile nuclear sclerosis 12/06/2020   Senile corneal changes 12/06/2020   Psychoses ([place]) 12/06/2020   Presbyopia 12/06/2020   Postmenopausal bleeding 12/06/2020   Postcoital bleeding 12/06/2020   Pinguecula 12/06/2020   Pain in joint, shoulder region 12/06/2020   Other psychological or physical stress 12/06/2020   Other acquired hammer toe 12/06/2020   Osteopenia 12/06/2020   Opioid dependence ([place]) 12/06/2020   Non-neoplastic nevus 12/06/2020   Essential hypertriglyceridemia 12/06/2020   Hypermetropia 12/06/2020   Debility  12/06/2020   Cystitis 12/06/2020   Depressed bipolar I disorder in full remission ([place]) 12/06/2020   Benzodiazepine dependence ([place]) 12/06/2020   Benign neoplasm of skin 12/06/2020   Anxiety 12/06/2020   Functional diarrhea 10/26/2020   Diabetes mellitus due to underlying condition with hyperglycemia, with long-term current use of insulin ([place]) 09/24/2020   Dyslipidemia 09/24/2020   Head trauma 09/17/2020   Chronic respiratory failure with hypoxia ([place]) 04/25/2020   Chronic constipation 04/25/2020   Back pain/sacroiliitis--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   Fall at home, initial encounter 04/23/2020   Ambulatory dysfunction--back pain and falls 04/23/2020   Chronic pain syndrome 08/22/2019   Nerve pain 08/22/2019   Myofascial pain dysfunction syndrome 08/22/2019   Dyspnea 08/22/2019   CAD (coronary artery disease)    Hypocalcemia    S/P shoulder replacement, 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   History of adenomatous polyp of colon 05/21/2019   Polypharmacy 05/21/2019   Benign paroxysmal positional vertigo due to bilateral vestibular disorder 05/20/2019   Hyperlipidemia associated with type 2 diabetes mellitus ([place]) 05/20/2019   Vertigo 04/25/2019   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   OSA (obstructive sleep apnea) 02/24/2019   Allergic rhinitis 02/24/2019   Acute on chronic respiratory failure with hypoxia ([place]) 01/22/2019   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   Morbid obesity with alveolar hypoventilation ([place]) 07/06/2018   Obesity (BMI 30-39.9) 07/06/2018   At risk for adverse drug event 07/06/2018   Cardiomegaly 82/50/0370   Non-alcoholic fatty liver disease 01/12/2018   Mild intermittent asthma 01/12/2018   Senile osteoporosis 12/15/2017   Atrial fibrillation with RVR ([place]) 05/19/2017   Fibromyalgia 03/19/2017   Migraine headache with aura 02/12/2016   Diabetic neuropathy associated with type 2 diabetes mellitus  ([place]) 02/06/2016   Bipolar 1 disorder, manic, moderate ([place]) 10/04/2015   Depression, recurrent ([place]) 10/03/2015   Herpes genitalis in women 07/16/2015   Long term current use of anticoagulant 05/30/2015   Family history of coronary arteriosclerosis 05/30/2015   Chronic diastolic heart failure ([place]) 05/30/2015   Dyspnea on exertion    Essential hypertension    Restless legs 04/27/2015   Lipoma 02/08/2015   Bipolar disorder ([place]) 01/23/2015   Insomnia 01/23/2015   Chronic back pain 01/04/2015   Chronic atrial fibrillation ([place]) 01/04/2015   Type 2 diabetes mellitus ([place])    Hyperlipidemia     PCP: [name] MD   REFERRING PROVIDER: Kary Kos, MD   REFERRING DIAG: M54.2 (ICD-10-CM) - Neck pain   THERAPY DIAG:  Other abnormalities of gait and mobility  Cervicalgia  Abnormal posture  Chronic bilateral low back pain without sciatica  Muscle weakness (generalized)  Unsteadiness on feet  Rationale for Evaluation and Treatment Rehabilitation  ONSET DATE: Chronic  SUBJECTIVE:  SUBJECTIVE STATEMENT:  "Good day today" reports no neck or back pain today; does report she is a little chilled today.   PERTINENT HISTORY:  -Chronic LBP, Stage 3 Kidney disease, RT TSA, DM -Patient presents to therapy with complaint of chronic neck pain. This has been ongoing for several years, worsening over the past 2 years. No unknown mechanism of injury. She is seeing pain management. Notes chronic low back pain as well. She is currently managing symptoms with prescribed pain medication. No prior therapy for this issue. She has tried acupuncture with good result.    PAIN:  Are you having pain? Yes: NPRS scale: 7/10 Pain location: neck, upper back Pain description: "hard pain" like breaking  bones, sore and stiffness Aggravating factors: sleeping  Relieving factors: stretching, meds   PRECAUTIONS: None  WEIGHT BEARING RESTRICTIONS No  FALLS:  Has patient fallen in last 6 months? No  LIVING ENVIRONMENT: Lives with: lives with their spouse Lives in: House/apartment  OCCUPATION: Retired   PLOF: Independent with basic ADLs  PATIENT GOALS "Have less pain"  OBJECTIVE:   DIAGNOSTIC FINDINGS:  IMPRESSION: 1. Motion degraded exam. Overall grossly stable appearance of the cervical spine as compared to 04/23/2020. 2. Multilevel cervical spondylosis with small central/paracentral protrusions at C4-5 and C5-6. Secondary mild flattening of the ventral cord at these levels without cord signal changes. 3. Mild right C4 and left C5 foraminal stenosis, with mild to moderate left C7 foraminal narrowing, stable.    PATIENT SURVEYS:  FOTO 38% function    COGNITION: Overall cognitive status: Within functional limits for tasks assessed   POSTURE: rounded shoulders and forward head  PALPATION:   Moderate TTP about bilateral upper trap    CERVICAL ROM:   Active ROM A/PROM (deg) eval  Flexion 50  Extension 37  Right lateral flexion   Left lateral flexion   Right rotation 42  Left rotation 42   (Blank rows = not tested)  UPPER EXTREMITY ROM:  Mod restriction in RT shoulder elevation, min restriction in LT shoulder elevation   UPPER EXTREMITY MMT:  MMT Right eval Left eval  Shoulder flexion    Shoulder extension    Shoulder abduction    Shoulder adduction    Shoulder extension    Shoulder internal rotation    Shoulder external rotation    Middle trapezius    Lower trapezius    Elbow flexion    Elbow extension    Wrist flexion    Wrist extension    Wrist ulnar deviation    Wrist radial deviation    Wrist pronation    Wrist supination    Grip strength     (Blank rows = not tested)   TODAY'S TREATMENT:  12/04/2021 UBE x 2'  backwards  Seated Seated cervical retractions 2 x 10 Seated scapular retractions 2 x 10 Seated posterior shoulder rolls 2 x 10 reps Seated blue physioball rolls x 1' Seated RTB scapular retraction 2 x 10 Seated RTB shoulder horizontal abduction 2 x 10 Upper trap stretch 10 sec holds x 3 each  STM x 10 min to bilateral upper trap and cervical spine, paraspinals.  No other treatment performed during manual treatment.    11/28/21 Upper trap stretch 10 sec holds x3 Levator stretch 10 sec x3 Mob to spinous process C4 with left lateral flexion following mulligan concept Self SNAG for cervical extension and rotation following mulligan concept. X10 each STM to sub occipital and upper trap region Seated cervical retractions x15  Seated  posterior shoulder rolls x8 reps Seated physioball forward rolls x5   11/26/21 3D Cervical excursions x10 each  Posterior shoulder rolls x 20  Scap retraction 15 x 5" Chin tuck 15 x 5" Upper trap stretch 3 x20" Levator stretch 3 x 20"  W back 15 x 5"     IASTM to bilateral upper trap and levator, patient seated   11/21/21 Seated 3D cervical excursions x 10 each  Posterior shoulder rolls x 20  Scap retraction 15 x 5" Chin tuck 15 x 5" Upper trap stretch 3 x20" Levator stretch 3 x 20"      IASTM to bilateral upper trap and levator, patient seated    11/06/21:  Seated: 3D cervical excursion all directions in pain free range 10x each   RTB rows   Wback   Chin tuck 15x 5"   Shoulder rolls posterior (up, back and down)   Upper trap stretch R/L 2x 30" each   Levator scapula 2x 30"   Educated log rolling for back pain   Supine: STM to Bil UT L>R, levator, scalenes    2 sets x 6mn cervical traction and suboccipital release  11/04/21  Upper trap stretch b/l 10 sec holds x3 b/l   Serratus stretch b/l 10 sec holds x3 b/l   Scap rolls in seated x10  Chin tuck x15  STM to sub occipital and upper trap region  Mob to spinous process C4 with left  lateral flexion following mulligan concept  Self SNAG for cervical extension and rotation following mulligan concept.     10/31/21 Scap retraction Chic tuck Upper trap stretch    PATIENT EDUCATION:  Education details:HEP updated 6/5 Person educated: Patient Education method: Explanation Education comprehension: verbalized understanding   HOME EXERCISE PROGRAM: URL: [email or web address] Access Code: [email or web address] 11/06/21 Prepared by: CIhor Austin LPTA/CLT; CBIS Wback 3x 10 5" holds    Access Code: [email or web address] URL: [email or web address] Date: 11/04/2021 Prepared by: JLeota Jacobsen Exercises - Seated Levator Scapulae Stretch  - 2 x daily - 7 x weekly - 1 sets - 3 reps - 10sec hold - Shoulder Rolls in Sitting  - 2 x daily - 7 x weekly - 2 sets - 10 reps - Seated Assisted Cervical Rotation with Towel  - 2 x daily - 7 x weekly - 1 sets - 3 reps - Upper Cervical Extension SNAG with Strap  - 2 x daily - 7 x weekly - 1 sets - 5 reps  Access Code: [email or web address] Date: 10/31/2021 Prepared by: CJosue Hector Exercises - Seated Scapular Retraction  - 3 x daily - 7 x weekly - 2 sets - 10 reps - 3 second hold - Seated Cervical Retraction  - 3 x daily - 7 x weekly - 2 sets - 10 reps - 3 second hold - Seated Upper Trapezius Stretch  - 3 x daily - 7 x weekly - 1 sets - 3 reps - 30 second hold  ASSESSMENT:  CLINICAL IMPRESSION: Today's session progressed upper extremity ROM and strengthening with the addition of the UBE and theraband scapular retractions and horizontal abductions. As the end of session approached today, patient began to shake with chills and states she starts to feel unwell.  She was switched to a stronger antibiotic to treat a current UTI and did not eat with taking her medication today.  Patient is also a bit lightheaded with standing.   Therapist discussed with patient and her husband patient symptoms that  require emergency  medical attention and that if she continues with malaise to notify her MD and they both are agreeable.  Patient will benefit from continued skilled therapy interventions to address deficits and improve functional mobility.    OBJECTIVE IMPAIRMENTS decreased activity tolerance, decreased mobility, decreased ROM, decreased strength, hypomobility, increased fascial restrictions, increased muscle spasms, impaired flexibility, impaired UE functional use, improper body mechanics, postural dysfunction, and pain.   ACTIVITY LIMITATIONS carrying, lifting, sleeping, bathing, dressing, reach over head, hygiene/grooming, and locomotion level  PARTICIPATION LIMITATIONS: cleaning, laundry, driving, shopping, community activity, and yard work  PERSONAL FACTORS  Chronic pain Hx  are also affecting patient's functional outcome.   REHAB POTENTIAL: Good  CLINICAL DECISION MAKING: Stable/uncomplicated  EVALUATION COMPLEXITY: Low   GOALS: SHORT TERM GOALS: Target date: 11/21/2021  Patient will be independent with initial HEP and self-management strategies to improve functional outcomes Baseline:  Goal status: Ongoing   LONG TERM GOALS: Target date: 12/12/2021  Patient will be independent with advanced HEP and self-management strategies to improve functional outcomes Baseline:  Goal status: Ongoing  2.  Patient will improve FOTO score to predicted value to indicate improvement in functional outcomes Baseline: 38%  Goal status: Ongoing  3.  Patient will demo improved bilateral cervical rotation by 10 degrees in order to improve ability to scan environment for safety and while driving. Baseline: See AROM  Goal status: Ongoing  4. Patient will report a decrease in neck pain to no more than 3/10 for improved quality of life and ability to perform UE ADLs  Baseline: 8/10 Goal status: Ongoing  PLAN: PT FREQUENCY: 2x/week  PT DURATION: 6 weeks  PLANNED INTERVENTIONS: Therapeutic  exercises, Therapeutic activity, Neuromuscular re-education, Balance training, Gait training, Patient/Family education, Joint manipulation, Joint mobilization, Stair training, Aquatic Therapy, Dry Needling, Electrical stimulation, Spinal manipulation, Spinal mobilization, Cryotherapy, Moist heat, scar mobilization, Taping, Traction, Ultrasound, Biofeedback, Ionotophoresis '4mg'$ /ml Dexamethasone, and Manual therapy.   PLAN FOR NEXT SESSION: Progress postural strength, improve cervical and shoulder AROM.    3:54 PM, 12/04/21 Aarushi Hemric Small Alexandra Lipps MPT [facility] [place] [phone]

## 2021-12-05 ENCOUNTER — Encounter (HOSPITAL_COMMUNITY): Payer: Medicare Other | Admitting: Physical Therapy

## 2021-12-06 ENCOUNTER — Encounter (HOSPITAL_COMMUNITY): Payer: Medicare Other

## 2021-12-06 ENCOUNTER — Encounter: Payer: Self-pay | Admitting: Physician Assistant

## 2021-12-06 ENCOUNTER — Ambulatory Visit (INDEPENDENT_AMBULATORY_CARE_PROVIDER_SITE_OTHER): Payer: Medicare Other | Admitting: Physician Assistant

## 2021-12-06 VITALS — BP 137/81 | HR 95 | Temp 98.4°F | Ht 62.0 in | Wt 188.4 lb

## 2021-12-06 DIAGNOSIS — I2510 Atherosclerotic heart disease of native coronary artery without angina pectoris: Secondary | ICD-10-CM | POA: Diagnosis not present

## 2021-12-06 DIAGNOSIS — R300 Dysuria: Secondary | ICD-10-CM

## 2021-12-06 LAB — URINALYSIS, ROUTINE W REFLEX MICROSCOPIC
Bilirubin, UA: NEGATIVE
Glucose, UA: NEGATIVE
Ketones, UA: NEGATIVE
Nitrite, UA: NEGATIVE
Protein,UA: NEGATIVE
RBC, UA: NEGATIVE
Specific Gravity, UA: 1.01 (ref 1.005–1.030)
Urobilinogen, Ur: 0.2 mg/dL (ref 0.2–1.0)
pH, UA: 6 (ref 5.0–7.5)

## 2021-12-06 LAB — MICROSCOPIC EXAMINATION
RBC, Urine: NONE SEEN /hpf (ref 0–2)
Renal Epithel, UA: NONE SEEN /hpf

## 2021-12-06 NOTE — Patient Instructions (Signed)
Dysuria Dysuria is pain or discomfort during urination. The pain or discomfort may be felt in the part of the body that drains urine from the bladder (urethra) or in the surrounding tissue of the genitals. The pain may also be felt in the groin area, lower abdomen, or lower back. You may have to urinate frequently or have the sudden feeling that you have to urinate (urgency). Dysuria can affect anyone, but it is more common in females. Dysuria can be caused by many different things, including: Urinary tract infection. Kidney stones or bladder stones. Certain STIs (sexually transmitted infections), such as chlamydia. Dehydration. Inflammation of the tissues of the vagina. Use of certain medicines. Use of certain soaps or scented products that cause irritation. Follow these instructions at home: Medicines Take over-the-counter and prescription medicines only as told by your health care provider. If you were prescribed an antibiotic medicine, take it as told by your health care provider. Do not stop taking the antibiotic even if you start to feel better. Eating and drinking  Drink enough fluid to keep your urine pale yellow. Avoid caffeinated beverages, tea, and alcohol. These beverages can irritate the bladder and make dysuria worse. In males, alcohol may irritate the prostate. General instructions Watch your condition for any changes. Urinate often. Avoid holding urine for long periods of time. If you are female, you should wipe from front to back after urinating or having a bowel movement. Use each piece of toilet paper only once. Empty your bladder after sex. Keep all follow-up visits. This is important. If you had any tests done to find the cause of dysuria, it is up to you to get your test results. Ask your health care provider, or the department that is doing the test, when your results will be ready. Contact a health care provider if: You have a fever. You develop pain in your back or  sides. You have nausea or vomiting. You have blood in your urine. You are not urinating as often as you usually do. Get help right away if: Your pain is severe and not relieved with medicines. You cannot eat or drink without vomiting. You are confused. You have a rapid heartbeat while resting. You have shaking or chills. You feel extremely weak. Summary Dysuria is pain or discomfort while urinating. Many different conditions can lead to dysuria. If you have dysuria, you may have to urinate frequently or have the sudden feeling that you have to urinate (urgency). Watch your condition for any changes. Keep all follow-up visits. Make sure that you urinate often and drink enough fluid to keep your urine pale yellow. This information is not intended to replace advice given to you by your health care provider. Make sure you discuss any questions you have with your health care provider. Document Revised: 12/30/2019 Document Reviewed: 12/30/2019 Elsevier Patient Education  2023 Elsevier Inc.  

## 2021-12-06 NOTE — Progress Notes (Signed)
  Subjective:     Patient ID: Amber Stephenson, female   DOB: 1945-09-04, 76 y.o.   MRN: [id number]  Dysuria  Pertinent negatives include no flank pain, frequency or hematuria.   Pt here for f/u Prev seen for dysuria - see notes Pt finishing her second ATB and still having some dysuria States urinates normal amt but has some burning When asked regarding BS readings states they have been lower than normal  Review of Systems  Genitourinary:  Positive for dysuria. Negative for difficulty urinating, flank pain, frequency, hematuria, vaginal discharge and vaginal pain.       Objective:   Physical Exam Vitals and nursing note reviewed.  Constitutional:      General: She is not in acute distress.    Appearance: Normal appearance. She is ill-appearing. She is not toxic-appearing.  Abdominal:     General: There is no distension.     Palpations: Abdomen is soft. There is no mass.     Tenderness: There is no abdominal tenderness. There is no right CVA tenderness, left CVA tenderness, guarding or rebound.  Neurological:     Mental Status: She is alert.   Prev notes/lab reviewed BMP pending     Assessment:     Dysuria    Plan:     Finish current course of ATB Reviewed neg UA and prev culture results Looks like recently tx'd for vag yeast infection and discussed this could cause dysuria sx Continue med for yeast infection Also her Endocrin has recently changed her diabetic meds so would like for her to get in touch with them about her low BS readings Have her f/u with her regular provider next week since it has been ~ 6 months since last visit [facility]

## 2021-12-07 LAB — BMP8+EGFR
BUN/Creatinine Ratio: 17 (ref 12–28)
BUN: 17 mg/dL (ref 8–27)
CO2: 24 mmol/L (ref 20–29)
Calcium: 10 mg/dL (ref 8.7–10.3)
Chloride: 100 mmol/L (ref 96–106)
Creatinine, Ser: 1.01 mg/dL — ABNORMAL HIGH (ref 0.57–1.00)
Glucose: 125 mg/dL — ABNORMAL HIGH (ref 70–99)
Potassium: 3.8 mmol/L (ref 3.5–5.2)
Sodium: 143 mmol/L (ref 134–144)
eGFR: 58 mL/min/{1.73_m2} — ABNORMAL LOW (ref >59)

## 2021-12-10 ENCOUNTER — Encounter (HOSPITAL_COMMUNITY): Payer: Self-pay | Admitting: Physical Therapy

## 2021-12-10 ENCOUNTER — Ambulatory Visit (HOSPITAL_COMMUNITY): Payer: Medicare Other | Admitting: Physical Therapy

## 2021-12-10 DIAGNOSIS — M545 Low back pain, unspecified: Secondary | ICD-10-CM | POA: Diagnosis not present

## 2021-12-10 DIAGNOSIS — R2689 Other abnormalities of gait and mobility: Secondary | ICD-10-CM

## 2021-12-10 DIAGNOSIS — M6281 Muscle weakness (generalized): Secondary | ICD-10-CM | POA: Diagnosis not present

## 2021-12-10 DIAGNOSIS — M542 Cervicalgia: Secondary | ICD-10-CM | POA: Diagnosis not present

## 2021-12-10 DIAGNOSIS — R293 Abnormal posture: Secondary | ICD-10-CM | POA: Diagnosis not present

## 2021-12-10 DIAGNOSIS — G8929 Other chronic pain: Secondary | ICD-10-CM | POA: Diagnosis not present

## 2021-12-10 NOTE — Therapy (Signed)
OUTPATIENT PHYSICAL THERAPY CERVICAL TREATMENT   Patient Name: Amber Stephenson MRN: [id number] DOB:1945-08-14, 76 y.o., female Today's Date: 12/10/2021   PT End of Session - 12/10/21 1512     Visit Number 8    Number of Visits 12    Date for PT Re-Evaluation 12/12/21    Authorization Type Medicare A; [employer]    Progress Note Due on Visit 10    PT Start Time 1515    PT Stop Time 1555    PT Time Calculation (min) 40 min    Activity Tolerance Patient tolerated treatment well    Behavior During Therapy [facility] for tasks assessed/performed               Past Medical History:  Diagnosis Date   Acute on chronic respiratory failure with hypoxia ([place]) 01/22/2019   Asthma    Atrial fibrillation with RVR 05/19/2017   Bipolar I disorder 01/23/2015   CAD (coronary artery disease)    Nonobstructive; Managed by Dr. Bronson Ing   Cardiomegaly 01/12/2018   Chronic anticoagulation 05/30/2015   Chronic atrial fibrillation ([place]) 01/04/2015   Chronic back pain    Lower back   Chronic diastolic CHF (congestive heart failure) 05/30/2015   Diabetes mellitus, type 2, without complication    Diabetic neuropathy 02/06/2016   Dysrhythmia    A-Fib   Essential hypertension    Herpes genitalis in women 07/16/2015   Hyperlipidemia    Hypoxia 02/01/2019   Insomnia 01/23/2015   Lipoma 02/08/2015   Major depressive disorder    Migraine headache with aura 02/12/2016   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   NAFL (nonalcoholic fatty liver) 11/23/7626   OSA (obstructive sleep apnea) 02/24/2019   10/09/2018 - HST  - AHI 40.6    Pulmonary hypertension    Renal insufficiency    Managed by Dr. Wallace Keller   RLS (restless legs syndrome) 04/27/2015   Past Surgical History:  Procedure Laterality Date   BREAST REDUCTION SURGERY     EYE SURGERY Right    cateracts   HAMMER TOE SURGERY     LEFT HEART CATH AND CORONARY ANGIOGRAPHY N/A 02/03/2018   Procedure: LEFT HEART CATH AND CORONARY  ANGIOGRAPHY;  Surgeon: Martinique, Peter M, MD;  Location: [facility];  Service: Cardiovascular;  Laterality: N/A;   REVERSE SHOULDER ARTHROPLASTY Right 08/19/2019   Procedure: REVERSE SHOULDER ARTHROPLASTY;  Surgeon: Netta Cedars, MD;  Location: [facility];  Service: Orthopedics;  Laterality: Right;  interscalene block   SHOULDER SURGERY Right    "I BROKE MY SHOUDLER   THIGH SURGERY     "TO REMOVE A TUMOR "   Patient Active Problem List   Diagnosis Date Noted   Myofascial pain 10/25/2021   Superficial fungus infection of skin 09/03/2021   Chronic right-sided low back pain with right-sided sciatica 07/19/2021   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   Vaginal itching 04/16/2021   Dysuria 04/16/2021   Yeast infection 01/14/2021   Type 2 diabetes mellitus without complication, with long-term current use of insulin ([place]) 12/25/2020   Acute on chronic diastolic CHF (congestive heart failure)/HFpEF Exacerbation 12/16/2020   Acute respiratory failure with hypoxia ([place]) 12/16/2020   UTI (urinary tract infection) 12/14/2020   Elevated brain natriuretic peptide (BNP) level 12/14/2020   Elevated troponin 12/14/2020   Acute exacerbation of CHF (congestive heart failure) ([place]) 12/13/2020   Chronic post-traumatic stress disorder 12/06/2020   Bipolar I disorder, most recent episode (or current) manic ([place]) 12/06/2020   Tremor  12/06/2020   Senile nuclear sclerosis 12/06/2020   Senile corneal changes 12/06/2020   Psychoses ([place]) 12/06/2020   Presbyopia 12/06/2020   Postmenopausal bleeding 12/06/2020   Postcoital bleeding 12/06/2020   Pinguecula 12/06/2020   Pain in joint, shoulder region 12/06/2020   Other psychological or physical stress 12/06/2020   Other acquired hammer toe 12/06/2020   Osteopenia 12/06/2020   Opioid dependence ([place]) 12/06/2020   Non-neoplastic nevus 12/06/2020   Essential hypertriglyceridemia 12/06/2020   Hypermetropia 12/06/2020   Debility 12/06/2020   Cystitis  12/06/2020   Depressed bipolar I disorder in full remission ([place]) 12/06/2020   Benzodiazepine dependence ([place]) 12/06/2020   Benign neoplasm of skin 12/06/2020   Anxiety 12/06/2020   Functional diarrhea 10/26/2020   Diabetes mellitus due to underlying condition with hyperglycemia, with long-term current use of insulin ([place]) 09/24/2020   Dyslipidemia 09/24/2020   Head trauma 09/17/2020   Chronic respiratory failure with hypoxia ([place]) 04/25/2020   Chronic constipation 04/25/2020   Back pain/sacroiliitis--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   Fall at home, initial encounter 04/23/2020   Ambulatory dysfunction--back pain and falls 04/23/2020   Chronic pain syndrome 08/22/2019   Nerve pain 08/22/2019   Myofascial pain dysfunction syndrome 08/22/2019   Dyspnea 08/22/2019   CAD (coronary artery disease)    Hypocalcemia    S/P shoulder replacement, 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   History of adenomatous polyp of colon 05/21/2019   Polypharmacy 05/21/2019   Benign paroxysmal positional vertigo due to bilateral vestibular disorder 05/20/2019   Hyperlipidemia associated with type 2 diabetes mellitus ([place]) 05/20/2019   Vertigo 04/25/2019   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   OSA (obstructive sleep apnea) 02/24/2019   Allergic rhinitis 02/24/2019   Acute on chronic respiratory failure with hypoxia ([place]) 01/22/2019   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   Morbid obesity with alveolar hypoventilation ([place]) 07/06/2018   Obesity (BMI 30-39.9) 07/06/2018   At risk for adverse drug event 07/06/2018   Cardiomegaly 17/40/8144   Non-alcoholic fatty liver disease 01/12/2018   Mild intermittent asthma 01/12/2018   Senile osteoporosis 12/15/2017   Atrial fibrillation with RVR ([place]) 05/19/2017   Fibromyalgia 03/19/2017   Migraine headache with aura 02/12/2016   Diabetic neuropathy associated with type 2 diabetes mellitus ([place]) 02/06/2016    Bipolar 1 disorder, manic, moderate ([place]) 10/04/2015   Depression, recurrent ([place]) 10/03/2015   Herpes genitalis in women 07/16/2015   Long term current use of anticoagulant 05/30/2015   Family history of coronary arteriosclerosis 05/30/2015   Chronic diastolic heart failure ([place]) 05/30/2015   Dyspnea on exertion    Essential hypertension    Restless legs 04/27/2015   Lipoma 02/08/2015   Bipolar disorder ([place]) 01/23/2015   Insomnia 01/23/2015   Chronic back pain 01/04/2015   Chronic atrial fibrillation ([place]) 01/04/2015   Type 2 diabetes mellitus ([place])    Hyperlipidemia     PCP: [name] MD   REFERRING PROVIDER: Kary Kos, MD   REFERRING DIAG: M54.2 (ICD-10-CM) - Neck pain   THERAPY DIAG:  Other abnormalities of gait and mobility  Cervicalgia  Abnormal posture  Rationale for Evaluation and Treatment Rehabilitation  ONSET DATE: Chronic  SUBJECTIVE:  SUBJECTIVE STATEMENT:  Patient says she is "OK". Feels things are improving.   PERTINENT HISTORY:  -Chronic LBP, Stage 3 Kidney disease, RT TSA, DM -Patient presents to therapy with complaint of chronic neck pain. This has been ongoing for several years, worsening over the past 2 years. No unknown mechanism of injury. She is seeing pain management. Notes chronic low back pain as well. She is currently managing symptoms with prescribed pain medication. No prior therapy for this issue. She has tried acupuncture with good result.    PAIN:  Are you having pain? Yes: NPRS scale: 4/10 Pain location: neck, upper back Pain description: "hard pain" like breaking bones, sore and stiffness Aggravating factors: sleeping  Relieving factors: stretching, meds   PRECAUTIONS: None  WEIGHT BEARING RESTRICTIONS No  FALLS:  Has patient  fallen in last 6 months? No  LIVING ENVIRONMENT: Lives with: lives with their spouse Lives in: House/apartment  OCCUPATION: Retired   PLOF: Independent with basic ADLs  PATIENT GOALS "Have less pain"  OBJECTIVE:   DIAGNOSTIC FINDINGS:  IMPRESSION: 1. Motion degraded exam. Overall grossly stable appearance of the cervical spine as compared to 04/23/2020. 2. Multilevel cervical spondylosis with small central/paracentral protrusions at C4-5 and C5-6. Secondary mild flattening of the ventral cord at these levels without cord signal changes. 3. Mild right C4 and left C5 foraminal stenosis, with mild to moderate left C7 foraminal narrowing, stable.    PATIENT SURVEYS:  FOTO 38% function    COGNITION: Overall cognitive status: Within functional limits for tasks assessed   POSTURE: rounded shoulders and forward head  PALPATION:   Moderate TTP about bilateral upper trap    CERVICAL ROM:   Active ROM A/PROM (deg) eval  Flexion 50  Extension 37  Right lateral flexion   Left lateral flexion   Right rotation 42  Left rotation 42   (Blank rows = not tested)  UPPER EXTREMITY ROM:  Mod restriction in RT shoulder elevation, min restriction in LT shoulder elevation   UPPER EXTREMITY MMT:  MMT Right eval Left eval  Shoulder flexion    Shoulder extension    Shoulder abduction    Shoulder adduction    Shoulder extension    Shoulder internal rotation    Shoulder external rotation    Middle trapezius    Lower trapezius    Elbow flexion    Elbow extension    Wrist flexion    Wrist extension    Wrist ulnar deviation    Wrist radial deviation    Wrist pronation    Wrist supination    Grip strength     (Blank rows = not tested)   TODAY'S TREATMENT:  12/04/2021 UBE x 2/2 lv1 FWD/ Retro Chin tuck 15 x 3" Scap retraction 15 x 3" BTB rows 2 x10 BTB shoulder extension 2 x10 Shoulder bilateral ER BTB 2 x 10 Seated UT stretch 3 x 20" Seated levator stretch 3 x 20"     Manual IASTM to bilateral upper trap and levator (seated)   Seated Seated cervical retractions 2 x 10 Seated scapular retractions 2 x 10 Seated posterior shoulder rolls 2 x 10 reps Seated blue physioball rolls x 1' Seated RTB scapular retraction 2 x 10 Seated RTB shoulder horizontal abduction 2 x 10 Upper trap stretch 10 sec holds x 3 each  STM x 10 min to bilateral upper trap and cervical spine, paraspinals.  No other treatment performed during manual treatment.  11/28/21 Upper trap stretch 10 sec holds x3 Levator  stretch 10 sec x3 Mob to spinous process C4 with left lateral flexion following mulligan concept Self SNAG for cervical extension and rotation following mulligan concept. X10 each STM to sub occipital and upper trap region Seated cervical retractions x15  Seated posterior shoulder rolls x8 reps Seated physioball forward rolls x5   11/26/21 3D Cervical excursions x10 each  Posterior shoulder rolls x 20  Scap retraction 15 x 5" Chin tuck 15 x 5" Upper trap stretch 3 x20" Levator stretch 3 x 20"  W back 15 x 5"     IASTM to bilateral upper trap and levator, patient seated   11/21/21 Seated 3D cervical excursions x 10 each  Posterior shoulder rolls x 20  Scap retraction 15 x 5" Chin tuck 15 x 5" Upper trap stretch 3 x20" Levator stretch 3 x 20"      IASTM to bilateral upper trap and levator, patient seated    11/06/21:  Seated: 3D cervical excursion all directions in pain free range 10x each   RTB rows   Wback   Chin tuck 15x 5"   Shoulder rolls posterior (up, back and down)   Upper trap stretch R/L 2x 30" each   Levator scapula 2x 30"   Educated log rolling for back pain   Supine: STM to Bil UT L>R, levator, scalenes    2 sets x 40mn cervical traction and suboccipital release  11/04/21  Upper trap stretch b/l 10 sec holds x3 b/l   Serratus stretch b/l 10 sec holds x3 b/l   Scap rolls in seated x10  Chin tuck x15  STM to sub occipital and upper trap  region  Mob to spinous process C4 with left lateral flexion following mulligan concept  Self SNAG for cervical extension and rotation following mulligan concept.     10/31/21 Scap retraction Chic tuck Upper trap stretch    PATIENT EDUCATION:  Education details:HEP updated 6/5 Person educated: Patient Education method: Explanation Education comprehension: verbalized understanding   HOME EXERCISE PROGRAM: URL: [email or web address] Access Code: [email or web address] 11/06/21 Prepared by: CIhor Austin LPTA/CLT; CBIS Wback 3x 10 5" holds    Access Code: [email or web address] URL: [email or web address] Date: 11/04/2021 Prepared by: JLeota Jacobsen Exercises - Seated Levator Scapulae Stretch  - 2 x daily - 7 x weekly - 1 sets - 3 reps - 10sec hold - Shoulder Rolls in Sitting  - 2 x daily - 7 x weekly - 2 sets - 10 reps - Seated Assisted Cervical Rotation with Towel  - 2 x daily - 7 x weekly - 1 sets - 3 reps - Upper Cervical Extension SNAG with Strap  - 2 x daily - 7 x weekly - 1 sets - 5 reps  Access Code: [email or web address]: [email or web address] Date: 10/31/2021 Prepared by: CJosue Hector Exercises - Seated Scapular Retraction  - 3 x daily - 7 x weekly - 2 sets - 10 reps - 3 second hold - Seated Cervical Retraction  - 3 x daily - 7 x weekly - 2 sets - 10 reps - 3 second hold - Seated Upper Trapezius Stretch  - 3 x daily - 7 x weekly - 1 sets - 3 reps - 30 second hold  ASSESSMENT:  CLINICAL IMPRESSION: Patient tolerated there ex progressions well today. Showing improved strength and able to increase band resistance to blue band. Most challenged with shoulder ER, requiring verbal cues for proper arm placement and body mechanics. She continues to be limited by postural deficits and increased facial  restriction in neck. Patient will continue to benefit from skilled therapy services to reduce remaining deficits and improve functional ability.     OBJECTIVE  IMPAIRMENTS decreased activity tolerance, decreased mobility, decreased ROM, decreased strength, hypomobility, increased fascial restrictions, increased muscle spasms, impaired flexibility, impaired UE functional use, improper body mechanics, postural dysfunction, and pain.   ACTIVITY LIMITATIONS carrying, lifting, sleeping, bathing, dressing, reach over head, hygiene/grooming, and locomotion level  PARTICIPATION LIMITATIONS: cleaning, laundry, driving, shopping, community activity, and yard work  PERSONAL FACTORS  Chronic pain Hx  are also affecting patient's functional outcome.   REHAB POTENTIAL: Good  CLINICAL DECISION MAKING: Stable/uncomplicated  EVALUATION COMPLEXITY: Low   GOALS: SHORT TERM GOALS: Target date: 11/21/2021  Patient will be independent with initial HEP and self-management strategies to improve functional outcomes Baseline:  Goal status: Ongoing   LONG TERM GOALS: Target date: 12/12/2021  Patient will be independent with advanced HEP and self-management strategies to improve functional outcomes Baseline:  Goal status: Ongoing  2.  Patient will improve FOTO score to predicted value to indicate improvement in functional outcomes Baseline: 38%  Goal status: Ongoing  3.  Patient will demo improved bilateral cervical rotation by 10 degrees in order to improve ability to scan environment for safety and while driving. Baseline: See AROM  Goal status: Ongoing  4. Patient will report a decrease in neck pain to no more than 3/10 for improved quality of life and ability to perform UE ADLs  Baseline: 8/10 Goal status: Ongoing  PLAN: PT FREQUENCY: 2x/week  PT DURATION: 6 weeks  PLANNED INTERVENTIONS: Therapeutic exercises, Therapeutic activity, Neuromuscular re-education, Balance training, Gait training, Patient/Family education, Joint manipulation, Joint mobilization, Stair training, Aquatic Therapy, Dry Needling, Electrical stimulation, Spinal manipulation, Spinal  mobilization, Cryotherapy, Moist heat, scar mobilization, Taping, Traction, Ultrasound, Biofeedback, Ionotophoresis '4mg'$ /ml Dexamethasone, and Manual therapy.   PLAN FOR NEXT SESSION: Reassess   3:13 PM, 12/10/21 Josue Hector PT DPT  Physical Therapist with [facility]  [phone]

## 2021-12-11 ENCOUNTER — Encounter: Payer: Self-pay | Admitting: Family Medicine

## 2021-12-11 ENCOUNTER — Ambulatory Visit (INDEPENDENT_AMBULATORY_CARE_PROVIDER_SITE_OTHER): Payer: Medicare Other | Admitting: Family Medicine

## 2021-12-11 VITALS — BP 128/82 | HR 82 | Temp 97.8°F | Ht 62.0 in | Wt 196.0 lb

## 2021-12-11 DIAGNOSIS — I10 Essential (primary) hypertension: Secondary | ICD-10-CM

## 2021-12-11 DIAGNOSIS — E782 Mixed hyperlipidemia: Secondary | ICD-10-CM | POA: Diagnosis not present

## 2021-12-11 DIAGNOSIS — Z794 Long term (current) use of insulin: Secondary | ICD-10-CM | POA: Diagnosis not present

## 2021-12-11 DIAGNOSIS — E11649 Type 2 diabetes mellitus with hypoglycemia without coma: Secondary | ICD-10-CM

## 2021-12-11 DIAGNOSIS — I2510 Atherosclerotic heart disease of native coronary artery without angina pectoris: Secondary | ICD-10-CM | POA: Diagnosis not present

## 2021-12-11 DIAGNOSIS — I482 Chronic atrial fibrillation, unspecified: Secondary | ICD-10-CM

## 2021-12-11 DIAGNOSIS — G250 Essential tremor: Secondary | ICD-10-CM

## 2021-12-11 DIAGNOSIS — F3112 Bipolar disorder, current episode manic without psychotic features, moderate: Secondary | ICD-10-CM

## 2021-12-11 DIAGNOSIS — Z8249 Family history of ischemic heart disease and other diseases of the circulatory system: Secondary | ICD-10-CM

## 2021-12-11 HISTORY — DX: Essential tremor: G25.0

## 2021-12-11 MED ORDER — BENZTROPINE MESYLATE 1 MG PO TABS: 1.0000 mg | ORAL_TABLET | Freq: Two times a day (BID) | ORAL | 2 refills | Status: DC

## 2021-12-11 NOTE — Progress Notes (Signed)
Subjective:  Patient ID: Amber Stephenson, female    DOB: 01-07-1946  Age: 76 y.o. MRN: [id number]  CC: Medical Management of Chronic Issues   HPI Amber Stephenson presents for diabetes with hypoglycemia. Seeing Dr. Kelton Pillar. She is to call in next time it falls. Using Dexcom 6 continual monitoring. Having essential tremor of neck about half the time. Felt to be from use of abilify. Dr. Casimiro Needle of psych decreased it some. Following with him. Also follows with Neurology Dr. Delice Lesch  Atrial fibrillation follow up. Pt. is treated with rate control and anticoagulation. Pt.  denies palpitations, rapid rate, chest pain, dyspnea and edema. There has been no bleeding from nose or gums. Pt. has not noticed blood with urine or stool.  Although there is routine bruising easily, it is not excessive.   in for follow-up of elevated cholesterol. Doing well without complaints on current medication. Denies side effects of statin including myalgia and arthralgia and nausea. Currently no chest pain, shortness of breath or other cardiovascular related symptoms noted.      12/11/2021    2:06 PM 12/11/2021    2:02 PM 12/06/2021    9:36 AM  Depression screen PHQ 2/9  Decreased Interest 0 0 0  Down, Depressed, Hopeless 0 0 0  PHQ - 2 Score 0 0 0    History Amber Stephenson has a past medical history of Acute exacerbation of CHF (congestive heart failure) ([place]) (12/13/2020), Acute on chronic diastolic CHF (congestive heart failure)/HFpEF Exacerbation (12/16/2020), Acute on chronic respiratory failure with hypoxia ([place]) (01/22/2019), Acute respiratory failure with hypoxia ([place]) (12/16/2020), Asthma, Atrial fibrillation with RVR (05/19/2017), Bipolar I disorder (01/23/2015), CAD (coronary artery disease), Cardiomegaly (01/12/2018), Chronic anticoagulation (05/30/2015), Chronic atrial fibrillation ([place]) (01/04/2015), Chronic back pain, Chronic diastolic CHF (congestive heart failure) (05/30/2015), Diabetes mellitus, type 2,  without complication, Diabetic neuropathy (02/06/2016), Dysrhythmia, Essential hypertension, Herpes genitalis in women (07/16/2015), Hyperlipidemia, Hypoxia (02/01/2019), Insomnia (01/23/2015), Lipoma (02/08/2015), Major depressive disorder, Migraine headache with aura (02/12/2016), 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04/21/2019), NAFL (nonalcoholic fatty liver) (62/94/7654), OSA (obstructive sleep apnea) (02/24/2019), Pulmonary hypertension, Renal insufficiency, and RLS (restless legs syndrome) (04/27/2015).   She has a past surgical history that includes THIGH SURGERY; Shoulder surgery (Right); Breast reduction surgery; Eye surgery (Right); Hammer toe surgery; LEFT HEART CATH AND CORONARY ANGIOGRAPHY (N/A, 02/03/2018); and Reverse shoulder arthroplasty (Right, 08/19/2019).   Her family history includes Alcohol abuse in her sister; Alzheimer's disease in her father; Diabetes in her brother, mother, and sister; Heart disease in her brother, mother, and sister; Mental illness in her brother; Stroke in her brother.She reports that she has never smoked. She has never used smokeless tobacco. She reports that she does not currently use alcohol. She reports that she does not use drugs.    ROS Review of Systems  Objective:  BP 128/82   Pulse 82   Temp 97.8 F (36.6 C)   Ht 5' 2" (1.575 m)   Wt 196 lb (88.9 kg)   SpO2 92%   BMI 35.85 kg/m   BP Readings from Last 3 Encounters:  12/11/21 128/82  12/06/21 137/81  11/26/21 (!) 148/117    Wt Readings from Last 3 Encounters:  12/11/21 196 lb (88.9 kg)  12/06/21 188 lb 6.4 oz (85.5 kg)  11/26/21 189 lb (85.7 kg)     Physical Exam Constitutional:      General: She is not in acute distress.    Appearance: She is well-developed.  Cardiovascular:     Rate and  Rhythm: Normal rate and regular rhythm.  Pulmonary:     Breath sounds: Normal breath sounds.  Musculoskeletal:        General: Normal range of motion.  Skin:    General: Skin is warm  and dry.  Neurological:     Mental Status: She is alert and oriented to person, place, and time.       Assessment & Plan:   Amber Stephenson was seen today for medical management of chronic issues.  Diagnoses and all orders for this visit:  Tremor, essential -     CMP14+EGFR -     CBC with Differential/Platelet  Type 2 diabetes mellitus with hypoglycemia without coma, with long-term current use of insulin (HCC) -     CMP14+EGFR -     CBC with Differential/Platelet -     Lipid panel  Essential hypertension -     CMP14+EGFR -     CBC with Differential/Platelet -     Lipid panel  Bipolar 1 disorder, manic, moderate (HCC) -     CMP14+EGFR -     CBC with Differential/Platelet  Chronic atrial fibrillation (HCC) -     CMP14+EGFR -     CBC with Differential/Platelet -     Lipid panel  Family history of coronary arteriosclerosis -     CMP14+EGFR -     CBC with Differential/Platelet  Mixed hyperlipidemia -     CMP14+EGFR -     CBC with Differential/Platelet -     Lipid panel  Other orders -     benztropine (COGENTIN) 1 MG tablet; Take 1 tablet (1 mg total) by mouth 2 (two) times daily.       I have discontinued Brittony Billick. Amber Stephenson's diltiazem and cefdinir. I am also having her start on benztropine. Additionally, I am having her maintain her polyethylene glycol, NON FORMULARY, Vitamin D (Cholecalciferol), albuterol, albuterol, apixaban, Fluticasone-Salmeterol, LORazepam, potassium chloride SA, Prolia, carbamazepine, furosemide, tiZANidine, alendronate, Dexcom G6 Transmitter, meclizine, atorvastatin, empagliflozin, ARIPiprazole, naloxone, Lantus SoloStar, insulin lispro, Rybelsus, metFORMIN, pregabalin, icosapent Ethyl, doxepin, escitalopram, ARIPiprazole, B-D UF III MINI PEN NEEDLES, Emgality, lidocaine, metoprolol succinate, metoprolol succinate, HYDROmorphone, Dexcom G7 Sensor, nystatin, and Pen Needles.  Allergies as of 12/11/2021       Reactions   Ivp Dye [iodinated Contrast  Media] Anaphylaxis, Swelling   Throat closes   Codeine Other (See Comments)   Iodine Other (See Comments)   Latex    Latex tape pulls skin with it        Medication List        Accurate as of December 11, 2021  7:39 PM. If you have any questions, ask your nurse or doctor.          STOP taking these medications    cefdinir 300 MG capsule Commonly known as: OMNICEF Stopped by: [name], MD   diltiazem 120 MG 24 hr capsule Commonly known as: CARDIZEM CD Stopped by: [name], MD       TAKE these medications    albuterol (2.5 MG/3ML) 0.083% nebulizer solution Commonly known as: PROVENTIL Take 3 mLs (2.5 mg total) by nebulization every 6 (six) hours as needed for wheezing or shortness of breath.   albuterol 108 (90 Base) MCG/ACT inhaler Commonly known as: VENTOLIN HFA Inhale 2 puffs into the lungs every 6 (six) hours as needed for wheezing or shortness of breath.   alendronate 70 MG tablet Commonly known as: FOSAMAX Take 1 tablet (70 mg total) by mouth every 7 (seven)  days. Take with a full glass of water on an empty stomach. Do not lay down for at least 2 hours   apixaban 5 MG Tabs tablet Commonly known as: Eliquis Take 1 tablet (5 mg total) by mouth 2 (two) times daily.   ARIPiprazole 5 MG tablet Commonly known as: ABILIFY Take 5 mg by mouth at bedtime.   ARIPiprazole 2 MG tablet Commonly known as: ABILIFY Take 2 mg by mouth at bedtime.   atorvastatin 40 MG tablet Commonly known as: LIPITOR TAKE 1 TABLET DAILY   B-D UF III MINI PEN NEEDLES 31G X 5 MM Misc Generic drug: Insulin Pen Needle Inject into the skin.   Pen Needles 30G X 5 MM Misc Use to inject insulin 5 times daily   benztropine 1 MG tablet Commonly known as: COGENTIN Take 1 tablet (1 mg total) by mouth 2 (two) times daily. Started by: [name], MD   carbamazepine 100 MG 12 hr capsule Commonly known as: CARBATROL Take by mouth at bedtime.   Dexcom G6 Transmitter Misc 1  Device by Does not apply route as directed.   Dexcom G7 Sensor Misc 1 Device by Does not apply route as directed.   doxepin 10 MG capsule Commonly known as: SINEQUAN Take 10 mg by mouth at bedtime.   Emgality 120 MG/ML Soaj Generic drug: Galcanezumab-gnlm INJECT INTO THE SKIN ONCE MONTHLY   empagliflozin 25 MG Tabs tablet Commonly known as: Jardiance Take 1 tablet (25 mg total) by mouth daily before breakfast.   escitalopram 10 MG tablet Commonly known as: LEXAPRO Take 10 mg by mouth daily.   Fluticasone-Salmeterol 500-50 MCG/DOSE Aepb Commonly known as: Advair Diskus Inhale 1 puff into the lungs in the morning and at bedtime.   furosemide 40 MG tablet Commonly known as: LASIX Take 1 tablet (40 mg total) by mouth every morning. TAKE 1 TABLET EVERY MORNING   HYDROmorphone 2 MG tablet Commonly known as: Dilaudid Take 1 tablet (2 mg total) by mouth every 6 (six) hours as needed for severe pain.   icosapent Ethyl 1 g capsule Commonly known as: VASCEPA TAKE 2 CAPSULES TWICE A DAY WITH MEALS   insulin lispro 100 UNIT/ML KwikPen Commonly known as: HumaLOG KwikPen Max Daily 50 units   Lantus SoloStar 100 UNIT/ML Solostar Pen Generic drug: insulin glargine Inject 38 Units into the skin 2 (two) times daily.   lidocaine 5 % Commonly known as: Lidoderm Place 1 patch onto the skin daily. Remove & Discard patch within 12 hours or as directed by MD   LORazepam 1 MG tablet Commonly known as: ATIVAN Take 1 tablet (1 mg total) by mouth 2 (two) times daily. And give 2 tablets by mouth at bedtime   meclizine 25 MG tablet Commonly known as: ANTIVERT Take 1 tablet (25 mg total) by mouth 3 (three) times daily as needed for dizziness.   metFORMIN 500 MG tablet Commonly known as: GLUCOPHAGE Take 1 tablet (500 mg total) by mouth 2 (two) times daily with a meal. TAKE 1 TABLET(500 MG) BY MOUTH TWICE DAILY AFTER A MEAL   metoprolol succinate 25 MG 24 hr tablet Commonly known as:  Toprol XL Take 1 tablet (25 mg total) by mouth in the morning and at bedtime.   metoprolol succinate 100 MG 24 hr tablet Commonly known as: TOPROL-XL TAKE 1 TABLET IN THE MORNING AND AT BEDTIME   naloxone 4 MG/0.1ML Liqd nasal spray kit Commonly known as: NARCAN SMARTSIG:Both Nares   NON FORMULARY Diet: _____ Regular,  ___  __ NAS, ___x____Consistent Carbohydrate, _______NPO _____Other   nystatin powder Commonly known as: nystatin Apply 1 Application topically 3 (three) times daily.   polyethylene glycol 17 g packet Commonly known as: MIRALAX / GLYCOLAX Take 17 g by mouth daily.   potassium chloride SA 20 MEQ tablet Commonly known as: KLOR-CON M Take 1 tablet (20 mEq total) by mouth daily.   pregabalin 200 MG capsule Commonly known as: LYRICA TAKE 1 CAPSULE(200 MG) BY MOUTH TWICE DAILY   Prolia 60 MG/ML Sosy injection Generic drug: denosumab Inject 60 mg into the skin every 6 (six) months.   Rybelsus 14 MG Tabs Generic drug: Semaglutide Take 1 tablet (14 mg total) by mouth daily.   tiZANidine 4 MG capsule Commonly known as: ZANAFLEX TAKE 1 CAPSULE BY MOUTH THREE TIMES DAILY AS NEEDED FOR MUSCLE SPASMS   Vitamin D (Cholecalciferol) 25 MCG (1000 UT) Tabs Take 1 tablet by mouth daily in the afternoon.         Follow-up: Return in about 6 months (around 06/13/2022).  [name], M.D.

## 2021-12-12 ENCOUNTER — Encounter (HOSPITAL_COMMUNITY): Payer: Self-pay

## 2021-12-12 ENCOUNTER — Other Ambulatory Visit (HOSPITAL_COMMUNITY): Payer: Self-pay | Admitting: Family Medicine

## 2021-12-12 ENCOUNTER — Ambulatory Visit (HOSPITAL_COMMUNITY): Payer: Medicare Other

## 2021-12-12 DIAGNOSIS — R2689 Other abnormalities of gait and mobility: Secondary | ICD-10-CM | POA: Diagnosis not present

## 2021-12-12 DIAGNOSIS — M545 Low back pain, unspecified: Secondary | ICD-10-CM | POA: Diagnosis not present

## 2021-12-12 DIAGNOSIS — M6281 Muscle weakness (generalized): Secondary | ICD-10-CM | POA: Diagnosis not present

## 2021-12-12 DIAGNOSIS — R293 Abnormal posture: Secondary | ICD-10-CM | POA: Diagnosis not present

## 2021-12-12 DIAGNOSIS — M542 Cervicalgia: Secondary | ICD-10-CM

## 2021-12-12 DIAGNOSIS — Z1231 Encounter for screening mammogram for malignant neoplasm of breast: Secondary | ICD-10-CM

## 2021-12-12 DIAGNOSIS — G8929 Other chronic pain: Secondary | ICD-10-CM | POA: Diagnosis not present

## 2021-12-12 NOTE — Therapy (Addendum)
OUTPATIENT PHYSICAL THERAPY CERVICAL TREATMENT Progress Note Reporting Period 10/31/21 to 12/12/21  See note below for Objective Data and Assessment of Progress/Goals.    Patient Name: Amber Stephenson MRN: [id number] DOB:[birth year], 76 y.o., female Today's Date: 12/12/2021   PT End of Session - 12/12/21 1611     Visit Number 9    Number of Visits 12    Authorization Type Medicare A; [employer]    Progress Note Due on Visit 10    PT Start Time 1604    PT Stop Time 1643    PT Time Calculation (min) 39 min    Activity Tolerance Patient tolerated treatment well    Behavior During Therapy WFL for tasks assessed/performed                Past Medical History:  Diagnosis Date   Acute exacerbation of CHF (congestive heart failure) ([place]) 12/13/2020   Acute on chronic diastolic CHF (congestive heart failure)/HFpEF Exacerbation 12/16/2020   Acute on chronic respiratory failure with hypoxia ([place]) 01/22/2019   Acute respiratory failure with hypoxia ([place]) 12/16/2020   Asthma    Atrial fibrillation with RVR 05/19/2017   Bipolar I disorder 01/23/2015   CAD (coronary artery disease)    Nonobstructive; Managed by Dr. Bronson Ing   Cardiomegaly 01/12/2018   Chronic anticoagulation 05/30/2015   Chronic atrial fibrillation ([place]) 01/04/2015   Chronic back pain    Lower back   Chronic diastolic CHF (congestive heart failure) 05/30/2015   Diabetes mellitus, type 2, without complication    Diabetic neuropathy 02/06/2016   Dysrhythmia    A-Fib   Essential hypertension    Herpes genitalis in women 07/16/2015   Hyperlipidemia    Hypoxia 02/01/2019   Insomnia 01/23/2015   Lipoma 02/08/2015   Major depressive disorder    Migraine headache with aura 02/12/2016   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   NAFL (nonalcoholic fatty liver) 04/03/1116   OSA (obstructive sleep apnea) 02/24/2019   10/09/2018 - HST  - AHI 40.6    Pulmonary hypertension    Renal insufficiency     Managed by Dr. Wallace Keller   RLS (restless legs syndrome) 04/27/2015   Past Surgical History:  Procedure Laterality Date   BREAST REDUCTION SURGERY     EYE SURGERY Right    cateracts   HAMMER TOE SURGERY     LEFT HEART CATH AND CORONARY ANGIOGRAPHY N/A 02/03/2018   Procedure: LEFT HEART CATH AND CORONARY ANGIOGRAPHY;  Surgeon: Martinique, Peter M, MD;  Location: [facility];  Service: Cardiovascular;  Laterality: N/A;   REVERSE SHOULDER ARTHROPLASTY Right 08/19/2019   Procedure: REVERSE SHOULDER ARTHROPLASTY;  Surgeon: Netta Cedars, MD;  Location: [facility];  Service: Orthopedics;  Laterality: Right;  interscalene block   SHOULDER SURGERY Right    "I BROKE MY SHOUDLER   THIGH SURGERY     "TO REMOVE A TUMOR "   Patient Active Problem List   Diagnosis Date Noted   Tremor, essential 12/11/2021   Myofascial pain 10/25/2021   Superficial fungus infection of skin 09/03/2021   Chronic right-sided low back pain with right-sided sciatica 07/19/2021   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   Vaginal itching 04/16/2021   Dysuria 04/16/2021   Yeast infection 01/14/2021   Type 2 diabetes mellitus without complication, with long-term current use of insulin ([place]) 12/25/2020   UTI (urinary tract infection) 12/14/2020   Elevated brain natriuretic peptide (BNP) level 12/14/2020   Elevated troponin 12/14/2020   Chronic post-traumatic stress disorder  12/06/2020   Tremor 12/06/2020   Senile nuclear sclerosis 12/06/2020   Senile corneal changes 12/06/2020   Psychoses ([place]) 12/06/2020   Presbyopia 12/06/2020   Postmenopausal bleeding 12/06/2020   Postcoital bleeding 12/06/2020   Pinguecula 12/06/2020   Pain in joint, shoulder region 12/06/2020   Other psychological or physical stress 12/06/2020   Other acquired hammer toe 12/06/2020   Osteopenia 12/06/2020   Opioid dependence ([place]) 12/06/2020   Non-neoplastic nevus 12/06/2020   Essential hypertriglyceridemia 12/06/2020   Hypermetropia 12/06/2020    Debility 12/06/2020   Cystitis 12/06/2020   Depressed bipolar I disorder in full remission ([place]) 12/06/2020   Benzodiazepine dependence ([place]) 12/06/2020   Benign neoplasm of skin 12/06/2020   Anxiety 12/06/2020   Functional diarrhea 10/26/2020   Diabetes mellitus due to underlying condition with hyperglycemia, with long-term current use of insulin ([place]) 09/24/2020   Dyslipidemia 09/24/2020   Head trauma 09/17/2020   Chronic respiratory failure with hypoxia ([place]) 04/25/2020   Chronic constipation 04/25/2020   Back pain/sacroiliitis--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   Fall at home, initial encounter 04/23/2020   Ambulatory dysfunction--back pain and falls 04/23/2020   Chronic pain syndrome 08/22/2019   Nerve pain 08/22/2019   Myofascial pain dysfunction syndrome 08/22/2019   Dyspnea 08/22/2019   CAD (coronary artery disease)    Hypocalcemia    S/P shoulder replacement, 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   History of adenomatous polyp of colon 05/21/2019   Polypharmacy 05/21/2019   Benign paroxysmal positional vertigo due to bilateral vestibular disorder 05/20/2019   Mixed hyperlipidemia 05/20/2019   Vertigo 04/25/2019   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   OSA (obstructive sleep apnea) 02/24/2019   Allergic rhinitis 02/24/2019   Acute on chronic respiratory failure with hypoxia ([place]) 01/22/2019   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   Morbid obesity with alveolar hypoventilation ([place]) 07/06/2018   Obesity (BMI 30-39.9) 07/06/2018   At risk for adverse drug event 07/06/2018   Cardiomegaly 68/37/2902   Non-alcoholic fatty liver disease 01/12/2018   Mild intermittent asthma 01/12/2018   Senile osteoporosis 12/15/2017   Fibromyalgia 03/19/2017   Migraine headache with aura 02/12/2016   Diabetic neuropathy associated with type 2 diabetes mellitus ([place]) 02/06/2016   Bipolar 1 disorder, manic, moderate ([place]) 10/04/2015    Depression, recurrent ([place]) 10/03/2015   Herpes genitalis in women 07/16/2015   Long term current use of anticoagulant 05/30/2015   Family history of coronary arteriosclerosis 05/30/2015   Chronic diastolic heart failure ([place]) 05/30/2015   Dyspnea on exertion    Essential hypertension    Restless legs 04/27/2015   Lipoma 02/08/2015   Insomnia 01/23/2015   Chronic back pain 01/04/2015   Chronic atrial fibrillation ([place]) 01/04/2015   Type 2 diabetes mellitus ([place])    Hyperlipidemia     PCP: [name] MD   REFERRING PROVIDER: Kary Kos, MD  Next apt 01/14/22:  wishes to add PT for her back  REFERRING DIAG: M54.2 (ICD-10-CM) - Neck pain   THERAPY DIAG:  Other abnormalities of gait and mobility  Cervicalgia  Abnormal posture  Rationale for Evaluation and Treatment Rehabilitation  ONSET DATE: Chronic  SUBJECTIVE:  SUBJECTIVE STATEMENT:  Pt reports increased neck pain today on Lt side of neck, thinks she slept wrong last night.  Stated her hips are bothering her more today, feels stiff.  Feels she has improved by 80% since beginning therapy.  Reports compliance with HEP daily.    PERTINENT HISTORY:  -Chronic LBP, Stage 3 Kidney disease, RT TSA, DM -Patient presents to therapy with complaint of chronic neck pain. This has been ongoing for several years, worsening over the past 2 years. No unknown mechanism of injury. She is seeing pain management. Notes chronic low back pain as well. She is currently managing symptoms with prescribed pain medication. No prior therapy for this issue. She has tried acupuncture with good result.    PAIN:  Are you having pain? Yes: NPRS scale: 7/10 Pain location: neck, upper back Pain description: "hard pain" like breaking bones, sore and  stiffness Aggravating factors: sleeping  Relieving factors: stretching, meds   PRECAUTIONS: None  WEIGHT BEARING RESTRICTIONS No  FALLS:  Has patient fallen in last 6 months? No  LIVING ENVIRONMENT: Lives with: lives with their spouse Lives in: House/apartment  OCCUPATION: Retired   PLOF: Independent with basic ADLs  PATIENT GOALS "Have less pain"  OBJECTIVE:   DIAGNOSTIC FINDINGS:  IMPRESSION: 1. Motion degraded exam. Overall grossly stable appearance of the cervical spine as compared to 04/23/2020. 2. Multilevel cervical spondylosis with small central/paracentral protrusions at C4-5 and C5-6. Secondary mild flattening of the ventral cord at these levels without cord signal changes. 3. Mild right C4 and left C5 foraminal stenosis, with mild to moderate left C7 foraminal narrowing, stable.    PATIENT SURVEYS:  FOTO 38% function   12/12/21: 51% improved functional ability   COGNITION: Overall cognitive status: Within functional limits for tasks assessed   POSTURE: rounded shoulders and forward head  PALPATION:   Moderate TTP about bilateral upper trap    CERVICAL ROM:   Active ROM A/PROM (deg) eval AROM  12/12/21  Flexion 50 50  Extension 37 50   Right lateral flexion    Left lateral flexion    Right rotation 42 60  Left rotation 42 51   (Blank rows = not tested)  UPPER EXTREMITY ROM:  Mod restriction in RT shoulder elevation, min restriction in LT shoulder elevation   UPPER EXTREMITY MMT:  MMT Right eval Left eval  Shoulder flexion    Shoulder extension    Shoulder abduction    Shoulder adduction    Shoulder extension    Shoulder internal rotation    Shoulder external rotation    Middle trapezius    Lower trapezius    Elbow flexion    Elbow extension    Wrist flexion    Wrist extension    Wrist ulnar deviation    Wrist radial deviation    Wrist pronation    Wrist supination    Grip strength     (Blank rows = not tested)   TODAY'S  TREATMENT:  12/12/21: 3D cervical excursion 10x each FOTO UBE 2' fwd/2' Retro STM in seated position UT, levator and Lt scalenes with AROM Chin tucks 15x 3" Cervical ROM measurement Wback 15x  12/04/2021 UBE x 2/2 lv1 FWD/ Retro Chin tuck 15 x 3" Scap retraction 15 x 3" BTB rows 2 x10 BTB shoulder extension 2 x10 Shoulder bilateral ER BTB 2 x 10 Seated UT stretch 3 x 20" Seated levator stretch 3 x 20"    Manual IASTM to bilateral upper trap and levator (seated)  Seated Seated cervical retractions 2 x 10 Seated scapular retractions 2 x 10 Seated posterior shoulder rolls 2 x 10 reps Seated blue physioball rolls x 1' Seated RTB scapular retraction 2 x 10 Seated RTB shoulder horizontal abduction 2 x 10 Upper trap stretch 10 sec holds x 3 each  STM x 10 min to bilateral upper trap and cervical spine, paraspinals.  No other treatment performed during manual treatment.  11/28/21 Upper trap stretch 10 sec holds x3 Levator stretch 10 sec x3 Mob to spinous process C4 with left lateral flexion following mulligan concept Self SNAG for cervical extension and rotation following mulligan concept. X10 each STM to sub occipital and upper trap region Seated cervical retractions x15  Seated posterior shoulder rolls x8 reps Seated physioball forward rolls x5   11/26/21 3D Cervical excursions x10 each  Posterior shoulder rolls x 20  Scap retraction 15 x 5" Chin tuck 15 x 5" Upper trap stretch 3 x20" Levator stretch 3 x 20"  W back 15 x 5"     IASTM to bilateral upper trap and levator, patient seated   11/21/21 Seated 3D cervical excursions x 10 each  Posterior shoulder rolls x 20  Scap retraction 15 x 5" Chin tuck 15 x 5" Upper trap stretch 3 x20" Levator stretch 3 x 20"      IASTM to bilateral upper trap and levator, patient seated    11/06/21:  Seated: 3D cervical excursion all directions in pain free range 10x each   RTB rows   Wback   Chin tuck 15x 5"   Shoulder rolls  posterior (up, back and down)   Upper trap stretch R/L 2x 30" each   Levator scapula 2x 30"   Educated log rolling for back pain   Supine: STM to Bil UT L>R, levator, scalenes    2 sets x 60mn cervical traction and suboccipital release  11/04/21  Upper trap stretch b/l 10 sec holds x3 b/l   Serratus stretch b/l 10 sec holds x3 b/l   Scap rolls in seated x10  Chin tuck x15  STM to sub occipital and upper trap region  Mob to spinous process C4 with left lateral flexion following mulligan concept  Self SNAG for cervical extension and rotation following mulligan concept.     10/31/21 Scap retraction Chic tuck Upper trap stretch    PATIENT EDUCATION:  Education details:HEP updated 6/5 Person educated: Patient Education method: Explanation Education comprehension: verbalized understanding   HOME EXERCISE PROGRAM: URL: [email or web address] Access Code: [email or web address] 11/06/21 Prepared by: CIhor Austin LPTA/CLT; CBIS Wback 3x 10 5" holds    Access Code: [email or web address] URL: [email or web address] Date: 11/04/2021 Prepared by: JLeota Jacobsen Exercises - Seated Levator Scapulae Stretch  - 2 x daily - 7 x weekly - 1 sets - 3 reps - 10sec hold - Shoulder Rolls in Sitting  - 2 x daily - 7 x weekly - 2 sets - 10 reps - Seated Assisted Cervical Rotation with Towel  - 2 x daily - 7 x weekly - 1 sets - 3 reps - Upper Cervical Extension SNAG with Strap  - 2 x daily - 7 x weekly - 1 sets - 5 reps  Access Code: [email or web address] Date: 10/31/2021 Prepared by: CJosue Hector Exercises - Seated Scapular Retraction  - 3 x daily - 7 x weekly - 2 sets - 10 reps - 3 second hold - Seated Cervical Retraction  - 3 x daily - 7 x weekly - 2  sets - 10 reps - 3 second hold - Seated Upper Trapezius Stretch  - 3 x daily - 7 x weekly - 1 sets - 3 reps - 30 second hold  ASSESSMENT:  CLINICAL IMPRESSION: Reviewed goals with reports of compliance  with advanced HEP daily.  Pt reports pain scale range from 4/10 to 7/10 at worst.  Pt continues to have cervical mobility limitations especially Lt rotation.  Presents with improved awareness of posture though does return to forward head position upon rest.  Manual STM complete to address restrictions with moderate tightness noted Lt UT, scalenes and scalenes.  Reports of improved motion and pain reduced to 3/10 following manual.  Pt would like to continue therapy for 2 more weeks.  Plans to discuss with MD upon next apt to add back pain as feels 80% improvements since began therapy.      OBJECTIVE IMPAIRMENTS decreased activity tolerance, decreased mobility, decreased ROM, decreased strength, hypomobility, increased fascial restrictions, increased muscle spasms, impaired flexibility, impaired UE functional use, improper body mechanics, postural dysfunction, and pain.   ACTIVITY LIMITATIONS carrying, lifting, sleeping, bathing, dressing, reach over head, hygiene/grooming, and locomotion level  PARTICIPATION LIMITATIONS: cleaning, laundry, driving, shopping, community activity, and yard work  PERSONAL FACTORS  Chronic pain Hx  are also affecting patient's functional outcome.   REHAB POTENTIAL: Good  CLINICAL DECISION MAKING: Stable/uncomplicated  EVALUATION COMPLEXITY: Low   GOALS: SHORT TERM GOALS: Target date: 11/21/2021  Patient will be independent with initial HEP and self-management strategies to improve functional outcomes Baseline: 7/13:  Reports compliance with HEP daily. Goal status: Met   LONG TERM GOALS: Target date: 12/12/2021  Patient will be independent with advanced HEP and self-management strategies to improve functional outcomes Baseline: 7/13:  Reports compliance with advanced HEP daily. Goal status: Ongoing  2.  Patient will improve FOTO score to predicted value to indicate improvement in functional outcomes (FOTO predicated value of 46) Baseline: eval: 38%  12/12/21:  51% Goal status: Met  3.  Patient will demo improved bilateral cervical rotation by 10 degrees in order to improve ability to scan environment for safety and while driving. Baseline: See AROM  Goal status: Ongoing  4. Patient will report a decrease in neck pain to no more than 3/10 for improved quality of life and ability to perform UE ADLs  Baseline:Eval: 8/10.  7/13:  Neck pain range from 4/10 to 7/10. Goal status: Ongoing  PLAN: PT FREQUENCY: 2x/week  PT DURATION: 6 weeks Continue 2x a week for 4 more weeks due to pt only having 9 treatments and all goals have not been met.   PLANNED INTERVENTIONS: Therapeutic exercises, Therapeutic activity, Neuromuscular re-education, Balance training, Gait training, Patient/Family education, Joint manipulation, Joint mobilization, Stair training, Aquatic Therapy, Dry Needling, Electrical stimulation, Spinal manipulation, Spinal mobilization, Cryotherapy, Moist heat, scar mobilization, Taping, Traction, Ultrasound, Biofeedback, Ionotophoresis 31m/ml Dexamethasone, and Manual therapy.   PLAN FOR NEXT SESSION:  Continue 4 more session with focus on mobility and posture.  CIhor Austin LPTA/CLT; CDelana [id number] PM, 12/12/21

## 2021-12-13 ENCOUNTER — Ambulatory Visit (INDEPENDENT_AMBULATORY_CARE_PROVIDER_SITE_OTHER): Payer: Medicare Other

## 2021-12-13 VITALS — Wt 196.0 lb

## 2021-12-13 DIAGNOSIS — Z0000 Encounter for general adult medical examination without abnormal findings: Secondary | ICD-10-CM | POA: Diagnosis not present

## 2021-12-13 NOTE — Progress Notes (Signed)
Subjective:   Amber Stephenson is a 76 y.o. female who presents for Medicare Annual (Subsequent) preventive examination.  Virtual Visit via Telephone Note  I connected with  Amber Stephenson on 12/13/21 at  1:15 PM EDT by telephone and verified that I am speaking with the correct person using two identifiers.  Location: Patient: Home Provider: [facility] Persons participating in the virtual visit: patient/Nurse Health Advisor   I discussed the limitations, risks, security and privacy concerns of performing an evaluation and management service by telephone and the availability of in person appointments. The patient expressed understanding and agreed to proceed.  Interactive audio and video telecommunications were attempted between this nurse and patient, however failed, due to patient having technical difficulties OR patient did not have access to video capability.  We continued and completed visit with audio only.  Some vital signs may be absent or patient reported.   Seger Jani E Deantae Shackleton, LPN   Review of Systems     Cardiac Risk Factors include: advanced age (>6mn, >>14women);diabetes mellitus;dyslipidemia;hypertension;obesity (BMI >30kg/m2);sedentary lifestyle;Other (see comment), Risk factor comments: A.Fib, OSA, cardiomegaly, CHF, fatty liver     Objective:    Today's Vitals   12/13/21 1319  Weight: 196 lb (88.9 kg)  PainSc: 6    Body mass index is 35.85 kg/m.     12/13/2021    1:25 PM 11/21/2021    5:44 AM 11/11/2021   11:01 AM 11/07/2021    3:19 PM 07/12/2021    1:38 PM 02/15/2021    3:32 PM 12/13/2020    3:55 PM  Advanced Directives  Does Patient Have a Medical Advance Directive? No No No Yes No No No  Would patient like information on creating a medical advance directive? No - Patient declined No - Patient declined    No - Patient declined No - Patient declined    Current Medications (verified) Outpatient Encounter Medications as of 12/13/2021  Medication Sig   albuterol  (PROVENTIL) (2.5 MG/3ML) 0.083% nebulizer solution Take 3 mLs (2.5 mg total) by nebulization every 6 (six) hours as needed for wheezing or shortness of breath.   albuterol (VENTOLIN HFA) 108 (90 Base) MCG/ACT inhaler Inhale 2 puffs into the lungs every 6 (six) hours as needed for wheezing or shortness of breath.   alendronate (FOSAMAX) 70 MG tablet Take 1 tablet (70 mg total) by mouth every 7 (seven) days. Take with a full glass of water on an empty stomach. Do not lay down for at least 2 hours   apixaban (ELIQUIS) 5 MG TABS tablet Take 1 tablet (5 mg total) by mouth 2 (two) times daily.   ARIPiprazole (ABILIFY) 2 MG tablet Take 2 mg by mouth at bedtime.   atorvastatin (LIPITOR) 40 MG tablet TAKE 1 TABLET DAILY   B-D UF III MINI PEN NEEDLES 31G X 5 MM MISC Inject into the skin.   benztropine (COGENTIN) 1 MG tablet Take 1 tablet (1 mg total) by mouth 2 (two) times daily.   carbamazepine (CARBATROL) 100 MG 12 hr capsule Take by mouth at bedtime.   Continuous Blood Gluc Sensor (DEXCOM G7 SENSOR) MISC 1 Device by Does not apply route as directed.   Continuous Blood Gluc Transmit (DEXCOM G6 TRANSMITTER) MISC 1 Device by Does not apply route as directed.   denosumab (PROLIA) 60 MG/ML SOSY injection Inject 60 mg into the skin every 6 (six) months.   doxepin (SINEQUAN) 10 MG capsule Take 10 mg by mouth at bedtime.   empagliflozin (JARDIANCE)  25 MG TABS tablet Take 1 tablet (25 mg total) by mouth daily before breakfast.   escitalopram (LEXAPRO) 10 MG tablet Take 10 mg by mouth daily.   Fluticasone-Salmeterol (ADVAIR DISKUS) 500-50 MCG/DOSE AEPB Inhale 1 puff into the lungs in the morning and at bedtime.   furosemide (LASIX) 40 MG tablet Take 1 tablet (40 mg total) by mouth every morning. TAKE 1 TABLET EVERY MORNING   Galcanezumab-gnlm (EMGALITY) 120 MG/ML SOAJ INJECT INTO THE SKIN ONCE MONTHLY   HYDROmorphone (DILAUDID) 2 MG tablet Take 1 tablet (2 mg total) by mouth every 6 (six) hours as needed for severe  pain.   icosapent Ethyl (VASCEPA) 1 g capsule TAKE 2 CAPSULES TWICE A DAY WITH MEALS   insulin glargine (LANTUS SOLOSTAR) 100 UNIT/ML Solostar Pen Inject 38 Units into the skin 2 (two) times daily.   insulin lispro (HUMALOG KWIKPEN) 100 UNIT/ML KwikPen Max Daily 50 units   Insulin Pen Needle (PEN NEEDLES) 30G X 5 MM MISC Use to inject insulin 5 times daily   lidocaine (LIDODERM) 5 % Place 1 patch onto the skin daily. Remove & Discard patch within 12 hours or as directed by MD   LORazepam (ATIVAN) 1 MG tablet Take 1 tablet (1 mg total) by mouth 2 (two) times daily. And give 2 tablets by mouth at bedtime   meclizine (ANTIVERT) 25 MG tablet Take 1 tablet (25 mg total) by mouth 3 (three) times daily as needed for dizziness.   metFORMIN (GLUCOPHAGE) 500 MG tablet Take 1 tablet (500 mg total) by mouth 2 (two) times daily with a meal. TAKE 1 TABLET(500 MG) BY MOUTH TWICE DAILY AFTER A MEAL   metoprolol succinate (TOPROL XL) 25 MG 24 hr tablet Take 1 tablet (25 mg total) by mouth in the morning and at bedtime.   metoprolol succinate (TOPROL-XL) 100 MG 24 hr tablet TAKE 1 TABLET IN THE MORNING AND AT BEDTIME   naloxone (NARCAN) nasal spray 4 mg/0.1 mL SMARTSIG:Both Nares   NON FORMULARY Diet: _____ Regular, ___  __ NAS, ___x____Consistent Carbohydrate, _______NPO _____Other   nystatin powder Apply 1 Application topically 3 (three) times daily.   polyethylene glycol (MIRALAX / GLYCOLAX) 17 g packet Take 17 g by mouth daily.   potassium chloride SA (KLOR-CON) 20 MEQ tablet Take 1 tablet (20 mEq total) by mouth daily.   pregabalin (LYRICA) 200 MG capsule TAKE 1 CAPSULE(200 MG) BY MOUTH TWICE DAILY   Semaglutide (RYBELSUS) 14 MG TABS Take 1 tablet (14 mg total) by mouth daily.   tiZANidine (ZANAFLEX) 4 MG capsule TAKE 1 CAPSULE BY MOUTH THREE TIMES DAILY AS NEEDED FOR MUSCLE SPASMS   Vitamin D, Cholecalciferol, 25 MCG (1000 UT) TABS Take 1 tablet by mouth daily in the afternoon.   [DISCONTINUED]  ARIPiprazole (ABILIFY) 5 MG tablet Take 5 mg by mouth at bedtime.   Facility-Administered Encounter Medications as of 12/13/2021  Medication   bupivacaine-epinephrine (MARCAINE W/ EPI) 0.5% -1:200000 injection    Allergies (verified) Ivp dye [iodinated contrast media], Codeine, Iodine, and Latex   History: Past Medical History:  Diagnosis Date   Acute exacerbation of CHF (congestive heart failure) ([place]) 12/13/2020   Acute on chronic diastolic CHF (congestive heart failure)/HFpEF Exacerbation 12/16/2020   Acute on chronic respiratory failure with hypoxia ([place]) 01/22/2019   Acute respiratory failure with hypoxia ([place]) 12/16/2020   Asthma    Atrial fibrillation with RVR 05/19/2017   Bipolar I disorder 01/23/2015   CAD (coronary artery disease)    Nonobstructive; Managed by  Dr. 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 Wallace Keller   RLS (restless legs syndrome) 04/27/2015   Past Surgical History:  Procedure Laterality Date   BREAST REDUCTION SURGERY     EYE SURGERY Right    cateracts   HAMMER TOE SURGERY     LEFT HEART CATH AND CORONARY ANGIOGRAPHY N/A 02/03/2018   Procedure: LEFT HEART CATH AND CORONARY ANGIOGRAPHY;  Surgeon: Martinique, Peter M, MD;  Location:  [facility];  Service: Cardiovascular;  Laterality: N/A;   REVERSE SHOULDER ARTHROPLASTY Right 08/19/2019   Procedure: REVERSE SHOULDER ARTHROPLASTY;  Surgeon: Netta Cedars, MD;  Location: [facility];  Service: Orthopedics;  Laterality: Right;  interscalene block   SHOULDER SURGERY Right    "I BROKE MY SHOUDLER   THIGH SURGERY     "TO REMOVE A TUMOR "   Family History  Problem Relation Age of Onset   Alzheimer's disease Father    Diabetes Mother    Heart disease Mother    Stroke Brother    Heart disease Brother    Mental illness Brother    Diabetes Brother    Heart disease Sister        CABG   Diabetes Sister    Alcohol abuse Sister    Social History   Socioeconomic History   Marital status: Married    Spouse name: Orpah Greek   Number of children: 3   Years of education: 15   Highest education level: Bachelor's degree (e.g., BA, AB, BS)  Occupational History   Occupation: Retired    Comment: marketing  Tobacco Use   Smoking status: Never   Smokeless tobacco: Never  Vaping Use   Vaping Use: Never used  Substance and Sexual Activity   Alcohol use: Not Currently    Alcohol/week: 0.0 standard drinks of alcohol   Drug use: No   Sexual activity: Yes    Partners: Male    Birth control/protection: Post-menopausal  Other Topics Concern   Not on file  Social History Narrative   Lives at home with husband.    They have 3 children who live away - [place], [place], and [place].   She is from [place] and most of her family lives there.   Her husbands's family live nearby   Right handed.   Social Determinants of Health   Financial Resource Strain: Low Risk  (12/13/2021)   Overall Financial Resource Strain (CARDIA)    Difficulty of Paying Living Expenses: Not hard at all  Food Insecurity: No Food Insecurity (12/13/2021)   Hunger Vital Sign    Worried About Running Out of Food in the Last Year: Never true    Ran Out of Food in the Last Year:  Never true  Transportation  Needs: No Transportation Needs (12/13/2021)   PRAPARE - Hydrologist (Medical): No    Lack of Transportation (Non-Medical): No  Physical Activity: Inactive (12/13/2021)   Exercise Vital Sign    Days of Exercise per Week: 0 days    Minutes of Exercise per Session: 0 min  Stress: No Stress Concern Present (12/13/2021)   [place]    Feeling of Stress : Not at all  Social Connections: [place] (12/13/2021)   Social Connection and Isolation Panel [NHANES]    Frequency of Communication with Friends and Family: More than three times a week    Frequency of Social Gatherings with Friends and Family: Twice a week    Attends Religious Services: More than 4 times per year    Active Member of [employer] or Organizations: Yes    Attends Music therapist: More than 4 times per year    Marital Status: Married    Tobacco Counseling Counseling given: Not Answered   Clinical Intake:  Pre-visit preparation completed: Yes  Pain : 0-10 Pain Score: 6  Pain Type: Chronic pain Pain Location: Back Pain Descriptors / Indicators: Aching, Discomfort, Sore Pain Onset: More than a month ago Pain Frequency: Intermittent     BMI - recorded: 35.85 Nutritional Status: BMI > 30  Obese Nutritional Risks: None Diabetes: Yes CBG done?: No Did pt. bring in CBG monitor from home?: No  How often do you need to have someone help you when you read instructions, pamphlets, or other written materials from your doctor or pharmacy?: 1 - Never  Diabetic? Nutrition Risk Assessment:  Has the patient had any N/V/D within the last 2 months?  No  Does the patient have any non-healing wounds?  No  Has the patient had any unintentional weight loss or weight gain?  No   Diabetes:  Is the patient diabetic?  Yes  If diabetic, was a CBG obtained today?  No  Did the patient bring in their glucometer from home?   No  How often do you monitor your CBG's? Dexcom 6 - CGM - frequently checks.   Financial Strains and Diabetes Management:  Are you having any financial strains with the device, your supplies or your medication? No .  Does the patient want to be seen by Chronic Care Management for management of their diabetes?  No  Would the patient like to be referred to a Nutritionist or for Diabetic Management?  No   Diabetic Exams:  Diabetic Eye Exam: Completed 11/27/2021.   Diabetic Foot Exam: Completed 05/20/2019. Pt has been advised about the importance in completing this exam. Pt is scheduled for diabetic foot exam on next visit.    Interpreter Needed?: No  Information entered by :: Merari Pion, LPN   Activities of Daily Living    12/13/2021    1:25 PM 12/14/2020    1:40 AM  In your present state of health, do you have any difficulty performing the following activities:  Hearing? 0 0  Vision? 0 0  Difficulty concentrating or making decisions? 0 0  Walking or climbing stairs? 1 1  Dressing or bathing? 0 0  Doing errands, shopping? 1 1  Preparing Food and eating ? N   Using the Toilet? N   In the past six months, have you accidently leaked urine? N   Do you have problems with loss of bowel control? N  Managing your Medications? N   Managing your Finances? N   Housekeeping or managing your Housekeeping? Y     Patient Care Team: [name], MD as PCP - General (Family Medicine) Weber Cooks, MD as Referring Physician (Orthopedic Surgery) Norma Fredrickson, MD as Consulting Physician (Psychiatry) Cameron Sprang, MD as Consulting Physician (Neurology) [facility], Melanie Crazier, MD as Consulting Physician (Endocrinology) Katy Apo, MD as Consulting Physician (Ophthalmology) Steffanie Rainwater, DPM as Consulting Physician (Podiatry)  Indicate any recent Medical Services you may have received from other than [facility] providers in the past year (date may be approximate).      Assessment:   This is a routine wellness examination for [place].  Hearing/Vision screen Hearing Screening - Comments:: Denies hearing difficulties   Vision Screening - Comments:: Wears rx glasses - up to date with routine eye exams with [facility]  Dietary issues and exercise activities discussed: Current Exercise Habits: The patient does not participate in regular exercise at present, Exercise limited by: orthopedic condition(s);respiratory conditions(s);cardiac condition(s);neurologic condition(s)   Goals Addressed             This Visit's Progress    Prevent falls       Work on balance       Depression Screen    12/13/2021    1:23 PM 12/11/2021    2:06 PM 12/11/2021    2:02 PM 12/06/2021    9:36 AM 11/26/2021    9:43 AM 11/26/2021    9:42 AM 10/25/2021   10:59 AM  PHQ 2/9 Scores  PHQ - 2 Score 0 0 0 0 0  0  Exception Documentation      Patient refusal     Fall Risk    12/13/2021    1:21 PM 12/11/2021    2:05 PM 12/11/2021    2:02 PM 12/06/2021    9:36 AM 11/07/2021    3:27 PM  Fall Risk   Falls in the past year? 1 1 0 0 0  Number falls in past yr: 1 1   0  Injury with Fall? 1 1   0  Risk for fall due to : History of fall(s);Impaired balance/gait;Orthopedic patient History of fall(s)     Follow up Education provided;Falls prevention discussed Falls evaluation completed       FALL RISK PREVENTION PERTAINING TO THE HOME:  Any stairs in or around the home? No  If so, are there any without handrails? No  Home free of loose throw rugs in walkways, pet beds, electrical cords, etc? Yes  Adequate lighting in your home to reduce risk of falls? Yes   ASSISTIVE DEVICES UTILIZED TO PREVENT FALLS:  Life alert? No  Use of a cane, walker or w/c? No  Grab bars in the bathroom? Yes  Shower chair or bench in shower? Yes  Elevated toilet seat or a handicapped toilet? No   TIMED UP AND GO:  Was the test performed? No . Telephonic visit  Cognitive Function:    11/08/2021    7:00  AM 08/28/2020   11:00 AM 01/18/2018    4:00 PM 09/22/2017    2:28 PM 04/29/2017    3:06 PM  MMSE - Mini Mental State Exam  Orientation to time '5 4 5 5 5  '$ Orientation to Place '5 5 5 5 4  '$ Registration '3 3 3 3 3  '$ Attention/ Calculation '3 1 3 5 5  '$ Recall '3 3 3 3 3  '$ Language- name 2 objects '2 2 2 2 '$ 2  Language- repeat '1 1 1 1 1  '$ Language- follow 3 step command '3 3 3 3 3  '$ Language- read & follow direction '1 1 1 1 1  '$ Write a sentence '1 1 1 1 1  '$ Copy design '1 1 1 1 1  '$ Total score '28 25 28 30 29      '$ 01/26/2019   11:00 AM 08/06/2018   11:00 AM  Montreal Cognitive Assessment   Visuospatial/ Executive (0/5) 2 4  Naming (0/3) 3 3  Attention: Read list of digits (0/2) 1 2  Attention: Read list of letters (0/1) 0 1  Attention: Serial 7 subtraction starting at 100 (0/3) 0 1  Language: Repeat phrase (0/2) 1 1  Language : Fluency (0/1) 0 0  Abstraction (0/2) 1 1  Delayed Recall (0/5) 0 4  Orientation (0/6) 3 6  Total 11 23      12/13/2021    1:26 PM 12/12/2020    1:35 PM 11/30/2019   11:41 AM 11/29/2018    2:46 PM  6CIT Screen  What Year? 0 points 0 points 0 points 0 points  What month? 0 points 0 points 0 points 0 points  What time? 0 points 0 points 0 points 0 points  Count back from 20 0 points 0 points 4 points 4 points  Months in reverse 0 points 2 points 4 points 4 points  Repeat phrase 2 points 0 points 4 points 2 points  Total Score 2 points 2 points 12 points 10 points    Immunizations Immunization History  Administered Date(s) Administered   Fluad Quad(high Dose 65+) 03/04/2019, 04/03/2021   Influenza, High Dose Seasonal PF 03/30/2015, 03/17/2016, 03/19/2017, 02/17/2018   Influenza,inj,Quad PF,6+ Mos 03/30/2015, 03/17/2016, 02/29/2020   Moderna Sars-Covid-2 Vaccination 07/18/2019, 08/15/2019, 05/02/2020   Pneumococcal Conjugate-13 11/12/2017   Pneumococcal Polysaccharide-23 05/30/2015   Tdap 07/17/2011   Zoster Recombinat (Shingrix) 01/30/2021, 04/09/2021   Zoster, Live  07/17/2011, 06/02/2012    TDAP status: Due, Education has been provided regarding the importance of this vaccine. Advised may receive this vaccine at local pharmacy or Health Dept. Aware to provide a copy of the vaccination record if obtained from local pharmacy or Health Dept. Verbalized acceptance and understanding.  Flu Vaccine status: Up to date  Pneumococcal vaccine status: Up to date  Covid-19 vaccine status: Completed vaccines  Qualifies for Shingles Vaccine? Yes   Zostavax completed Yes   Shingrix Completed?: Yes  Screening Tests Health Maintenance  Topic Date Due   FOOT EXAM  05/19/2020   URINE MICROALBUMIN  08/28/2020   COVID-19 Vaccine (4 - Booster for Moderna series) 12/27/2021 (Originally 06/27/2020)   TETANUS/TDAP  12/12/2022 (Originally 07/16/2021)   INFLUENZA VACCINE  12/31/2021   MAMMOGRAM  01/14/2022   COLONOSCOPY (Pts 45-45yr Insurance coverage will need to be confirmed)  03/31/2022   HEMOGLOBIN A1C  05/24/2022   OPHTHALMOLOGY EXAM  11/28/2022   DEXA SCAN  04/06/2023   Pneumonia Vaccine 76 Years old  Completed   Hepatitis C Screening  Completed   Zoster Vaccines- Shingrix  Completed   HPV VACCINES  Aged Out    Health Maintenance  Health Maintenance Due  Topic Date Due   FOOT EXAM  05/19/2020   URINE MICROALBUMIN  08/28/2020    Colorectal cancer screening: Type of screening: Colonoscopy. Completed 03/31/2017. Repeat every 5 years  Mammogram status: Completed 01/14/2021. Repeat every year has appt 02/14/22  Bone Density status: Completed 04/15/2021. Results reflect: Bone density results: OSTEOPENIA. Repeat every 2 years.  Lung  Cancer Screening: (Low Dose CT Chest recommended if Age 11-80 years, 30 pack-year currently smoking OR have quit w/in 15years.) does not qualify.  Additional Screening:  Hepatitis C Screening: does qualify; Completed 03/30/2015  Vision Screening: Recommended annual ophthalmology exams for early detection of glaucoma and other  disorders of the eye. Is the patient up to date with their annual eye exam?  Yes  Who is the provider or what is the name of the office in which the patient attends annual eye exams? [facility] If pt is not established with a provider, would they like to be referred to a provider to establish care? No .   Dental Screening: Recommended annual dental exams for proper oral hygiene  Community Resource Referral / Chronic Care Management: CRR required this visit?  No   CCM required this visit?  No      Plan:     I have personally reviewed and noted the following in the patient's chart:   Medical and social history Use of alcohol, tobacco or illicit drugs  Current medications and supplements including opioid prescriptions.  Functional ability and status Nutritional status Physical activity Advanced directives List of other physicians Hospitalizations, surgeries, and ER visits in previous 12 months Vitals Screenings to include cognitive, depression, and falls Referrals and appointments  In addition, I have reviewed and discussed with patient certain preventive protocols, quality metrics, and best practice recommendations. A written personalized care plan for preventive services as well as general preventive health recommendations were provided to patient.     Sandrea Hammond, LPN   12/06/2374   Nurse Notes: None

## 2021-12-13 NOTE — Patient Instructions (Signed)
[name] , Thank you for taking time to come for your Medicare Wellness Visit. I appreciate your ongoing commitment to your health goals. Please review the following plan we discussed and let me know if I can assist you in the future.   Screening recommendations/referrals: Colonoscopy: Done 03/31/2017 - Repeat in 5 years *due this year Mammogram: Done 01/14/2021 - Repeat annually *appointment 02/14/2022 Bone Density: Done 04/05/2021 - Repeat every 2 years Recommended yearly ophthalmology/optometry visit for glaucoma screening and checkup Recommended yearly dental visit for hygiene and checkup  Vaccinations: Influenza vaccine: Done 04/03/2021 - Repeat annually  Pneumococcal vaccine: Done 05/10/2015 & 11/12/2017   Tdap vaccine: Done 07/17/2011 - Repeat in 10 years *due Shingles vaccine: Done 01/30/2021 & 04/09/2021 Covid-19: Done 07/18/19, 08/15/19, & 05/02/2020  Advanced directives: Advance directive discussed with you today. Even though you declined this today, please call our office should you change your mind, and we can give you the proper paperwork for you to fill out.   Conditions/risks identified: Aim for 30 minutes of exercise or brisk walking, 6-8 glasses of water, and 5 servings of fruits and vegetables each day.   Next appointment: Follow up in one year for your annual wellness visit    Preventive Care 65 Years and Older, Female Preventive care refers to lifestyle choices and visits with your health care provider that can promote health and wellness. What does preventive care include? A yearly physical exam. This is also called an annual well check. Dental exams once or twice a year. Routine eye exams. Ask your health care provider how often you should have your eyes checked. Personal lifestyle choices, including: Daily care of your teeth and gums. Regular physical activity. Eating a healthy diet. Avoiding tobacco and drug use. Limiting alcohol use. Practicing safe sex. Taking  low-dose aspirin every day. Taking vitamin and mineral supplements as recommended by your health care provider. What happens during an annual well check? The services and screenings done by your health care provider during your annual well check will depend on your age, overall health, lifestyle risk factors, and family history of disease. Counseling  Your health care provider may ask you questions about your: Alcohol use. Tobacco use. Drug use. Emotional well-being. Home and relationship well-being. Sexual activity. Eating habits. History of falls. Memory and ability to understand (cognition). Work and work Statistician. Reproductive health. Screening  You may have the following tests or measurements: Height, weight, and BMI. Blood pressure. Lipid and cholesterol levels. These may be checked every 5 years, or more frequently if you are over 76 years old. Skin check. Lung cancer screening. You may have this screening every year starting at age 29 if you have a 30-pack-year history of smoking and currently smoke or have quit within the past 15 years. Fecal occult blood test (FOBT) of the stool. You may have this test every year starting at age 59. Flexible sigmoidoscopy or colonoscopy. You may have a sigmoidoscopy every 5 years or a colonoscopy every 10 years starting at age 40. Hepatitis C blood test. Hepatitis B blood test. Sexually transmitted disease (STD) testing. Diabetes screening. This is done by checking your blood sugar (glucose) after you have not eaten for a while (fasting). You may have this done every 1-3 years. Bone density scan. This is done to screen for osteoporosis. You may have this done starting at age 73. Mammogram. This may be done every 1-2 years. Talk to your health care provider about how often you should have regular mammograms.  Talk with your health care provider about your test results, treatment options, and if necessary, the need for more tests. Vaccines   Your health care provider may recommend certain vaccines, such as: Influenza vaccine. This is recommended every year. Tetanus, diphtheria, and acellular pertussis (Tdap, Td) vaccine. You may need a Td booster every 10 years. Zoster vaccine. You may need this after age 37. Pneumococcal 13-valent conjugate (PCV13) vaccine. One dose is recommended after age 105. Pneumococcal polysaccharide (PPSV23) vaccine. One dose is recommended after age 64. Talk to your health care provider about which screenings and vaccines you need and how often you need them. This information is not intended to replace advice given to you by your health care provider. Make sure you discuss any questions you have with your health care provider. Document Released: 06/15/2015 Document Revised: 02/06/2016 Document Reviewed: 03/20/2015 Elsevier Interactive Patient Education  2017 [employer] Prevention in the Home Falls can cause injuries. They can happen to people of all ages. There are many things you can do to make your home safe and to help prevent falls. What can I do on the outside of my home? Regularly fix the edges of walkways and driveways and fix any cracks. Remove anything that might make you trip as you walk through a door, such as a raised step or threshold. Trim any bushes or trees on the path to your home. Use bright outdoor lighting. Clear any walking paths of anything that might make someone trip, such as rocks or tools. Regularly check to see if handrails are loose or broken. Make sure that both sides of any steps have handrails. Any raised decks and porches should have guardrails on the edges. Have any leaves, snow, or ice cleared regularly. Use sand or salt on walking paths during winter. Clean up any spills in your garage right away. This includes oil or grease spills. What can I do in the bathroom? Use night lights. Install grab bars by the toilet and in the tub and shower. Do not use towel  bars as grab bars. Use non-skid mats or decals in the tub or shower. If you need to sit down in the shower, use a plastic, non-slip stool. Keep the floor dry. Clean up any water that spills on the floor as soon as it happens. Remove soap buildup in the tub or shower regularly. Attach bath mats securely with double-sided non-slip rug tape. Do not have throw rugs and other things on the floor that can make you trip. What can I do in the bedroom? Use night lights. Make sure that you have a light by your bed that is easy to reach. Do not use any sheets or blankets that are too big for your bed. They should not hang down onto the floor. Have a firm chair that has side arms. You can use this for support while you get dressed. Do not have throw rugs and other things on the floor that can make you trip. What can I do in the kitchen? Clean up any spills right away. Avoid walking on wet floors. Keep items that you use a lot in easy-to-reach places. If you need to reach something above you, use a strong step stool that has a grab bar. Keep electrical cords out of the way. Do not use floor polish or wax that makes floors slippery. If you must use wax, use non-skid floor wax. Do not have throw rugs and other things on the floor that can make  you trip. What can I do with my stairs? Do not leave any items on the stairs. Make sure that there are handrails on both sides of the stairs and use them. Fix handrails that are broken or loose. Make sure that handrails are as long as the stairways. Check any carpeting to make sure that it is firmly attached to the stairs. Fix any carpet that is loose or worn. Avoid having throw rugs at the top or bottom of the stairs. If you do have throw rugs, attach them to the floor with carpet tape. Make sure that you have a light switch at the top of the stairs and the bottom of the stairs. If you do not have them, ask someone to add them for you. What else can I do to help  prevent falls? Wear shoes that: Do not have high heels. Have rubber bottoms. Are comfortable and fit you well. Are closed at the toe. Do not wear sandals. If you use a stepladder: Make sure that it is fully opened. Do not climb a closed stepladder. Make sure that both sides of the stepladder are locked into place. Ask someone to hold it for you, if possible. Clearly mark and make sure that you can see: Any grab bars or handrails. First and last steps. Where the edge of each step is. Use tools that help you move around (mobility aids) if they are needed. These include: Canes. Walkers. Scooters. Crutches. Turn on the lights when you go into a dark area. Replace any light bulbs as soon as they burn out. Set up your furniture so you have a clear path. Avoid moving your furniture around. If any of your floors are uneven, fix them. If there are any pets around you, be aware of where they are. Review your medicines with your doctor. Some medicines can make you feel dizzy. This can increase your chance of falling. Ask your doctor what other things that you can do to help prevent falls. This information is not intended to replace advice given to you by your health care provider. Make sure you discuss any questions you have with your health care provider. Document Released: 03/15/2009 Document Revised: 10/25/2015 Document Reviewed: 06/23/2014 Elsevier Interactive Patient Education  2017 [employer].

## 2021-12-16 ENCOUNTER — Other Ambulatory Visit: Payer: Self-pay | Admitting: *Deleted

## 2021-12-16 DIAGNOSIS — I5032 Chronic diastolic (congestive) heart failure: Secondary | ICD-10-CM

## 2021-12-16 DIAGNOSIS — I4821 Permanent atrial fibrillation: Secondary | ICD-10-CM

## 2021-12-16 DIAGNOSIS — E782 Mixed hyperlipidemia: Secondary | ICD-10-CM

## 2021-12-16 MED ORDER — ICOSAPENT ETHYL 1 G PO CAPS: ORAL_CAPSULE | ORAL | 1 refills | Status: DC

## 2021-12-17 ENCOUNTER — Ambulatory Visit (HOSPITAL_COMMUNITY): Payer: Medicare Other

## 2021-12-17 DIAGNOSIS — R293 Abnormal posture: Secondary | ICD-10-CM

## 2021-12-17 DIAGNOSIS — M545 Low back pain, unspecified: Secondary | ICD-10-CM | POA: Diagnosis not present

## 2021-12-17 DIAGNOSIS — M542 Cervicalgia: Secondary | ICD-10-CM | POA: Diagnosis not present

## 2021-12-17 DIAGNOSIS — M79674 Pain in right toe(s): Secondary | ICD-10-CM | POA: Diagnosis not present

## 2021-12-17 DIAGNOSIS — G8929 Other chronic pain: Secondary | ICD-10-CM | POA: Diagnosis not present

## 2021-12-17 DIAGNOSIS — R2689 Other abnormalities of gait and mobility: Secondary | ICD-10-CM | POA: Diagnosis not present

## 2021-12-17 DIAGNOSIS — L03031 Cellulitis of right toe: Secondary | ICD-10-CM | POA: Diagnosis not present

## 2021-12-17 DIAGNOSIS — M6281 Muscle weakness (generalized): Secondary | ICD-10-CM | POA: Diagnosis not present

## 2021-12-17 NOTE — Therapy (Signed)
OUTPATIENT PHYSICAL THERAPY CERVICAL TREATMENT Progress Note Reporting Period 10/31/21 to 12/12/21  See note below for Objective Data and Assessment of Progress/Goals.    Patient Name: Amber Stephenson MRN: [id number] DOB:[birth year], 76 y.o., female Today's Date: 12/17/2021   PT End of Session - 12/17/21 1640     Visit Number 10    Number of Visits 12    Authorization Type Medicare A; [employer]    Progress Note Due on Visit 20    PT Start Time 1600    PT Stop Time 1638    PT Time Calculation (min) 38 min    Activity Tolerance Patient limited by fatigue;Other (comment)   dizziness in rotation   Behavior During Therapy [facility] for tasks assessed/performed                 Past Medical History:  Diagnosis Date   Acute exacerbation of CHF (congestive heart failure) ([place]) 12/13/2020   Acute on chronic diastolic CHF (congestive heart failure)/HFpEF Exacerbation 12/16/2020   Acute on chronic respiratory failure with hypoxia ([place]) 01/22/2019   Acute respiratory failure with hypoxia ([place]) 12/16/2020   Asthma    Atrial fibrillation with RVR 05/19/2017   Bipolar I disorder 01/23/2015   CAD (coronary artery disease)    Nonobstructive; Managed by Dr. Bronson Ing   Cardiomegaly 01/12/2018   Chronic anticoagulation 05/30/2015   Chronic atrial fibrillation ([place]) 01/04/2015   Chronic back pain    Lower back   Chronic diastolic CHF (congestive heart failure) 05/30/2015   Diabetes mellitus, type 2, without complication    Diabetic neuropathy 02/06/2016   Dysrhythmia    A-Fib   Essential hypertension    Herpes genitalis in women 07/16/2015   Hyperlipidemia    Hypoxia 02/01/2019   Insomnia 01/23/2015   Lipoma 02/08/2015   Major depressive disorder    Migraine headache with aura 02/12/2016   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   NAFL (nonalcoholic fatty liver) 05/39/7673   OSA (obstructive sleep apnea) 02/24/2019   10/09/2018 - HST  - AHI 40.6    Pulmonary  hypertension    Renal insufficiency    Managed by Dr. Wallace Keller   RLS (restless legs syndrome) 04/27/2015   Past Surgical History:  Procedure Laterality Date   BREAST REDUCTION SURGERY     EYE SURGERY Right    cateracts   HAMMER TOE SURGERY     LEFT HEART CATH AND CORONARY ANGIOGRAPHY N/A 02/03/2018   Procedure: LEFT HEART CATH AND CORONARY ANGIOGRAPHY;  Surgeon: Martinique, Peter M, MD;  Location: [facility];  Service: Cardiovascular;  Laterality: N/A;   REVERSE SHOULDER ARTHROPLASTY Right 08/19/2019   Procedure: REVERSE SHOULDER ARTHROPLASTY;  Surgeon: Netta Cedars, MD;  Location: [facility];  Service: Orthopedics;  Laterality: Right;  interscalene block   SHOULDER SURGERY Right    "I BROKE MY SHOUDLER   THIGH SURGERY     "TO REMOVE A TUMOR "   Patient Active Problem List   Diagnosis Date Noted   Tremor, essential 12/11/2021   Superficial fungus infection of skin 09/03/2021   Chronic right-sided low back pain with right-sided sciatica 07/19/2021   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   Vaginal itching 04/16/2021   Dysuria 04/16/2021   Sensory ataxia 03/28/2021   Yeast infection 01/14/2021   UTI (urinary tract infection) 12/14/2020   Elevated brain natriuretic peptide (BNP) level 12/14/2020   Elevated troponin 12/14/2020   Chronic post-traumatic stress disorder 12/06/2020   Tremor 12/06/2020   Senile nuclear sclerosis  12/06/2020   Senile corneal changes 12/06/2020   Psychoses ([place]) 12/06/2020   Presbyopia 12/06/2020   Postmenopausal bleeding 12/06/2020   Postcoital bleeding 12/06/2020   Pinguecula 12/06/2020   Pain in joint, shoulder region 12/06/2020   Other psychological or physical stress 12/06/2020   Other acquired hammer toe 12/06/2020   Osteopenia 12/06/2020   Opioid dependence ([place]) 12/06/2020   Non-neoplastic nevus 12/06/2020   Essential hypertriglyceridemia 12/06/2020   Hypermetropia 12/06/2020   Debility 12/06/2020   Cystitis 12/06/2020   Depressed  bipolar I disorder in full remission (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   Ambulatory dysfunction--back pain and falls 04/23/2020   Chronic pain syndrome 08/22/2019   Nerve pain 08/22/2019   Myofascial pain dysfunction syndrome 08/22/2019   CAD (coronary artery disease)    Hypocalcemia    S/P shoulder replacement, 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   History of adenomatous polyp of colon 05/21/2019   Polypharmacy 05/21/2019   Benign paroxysmal positional vertigo due to bilateral vestibular disorder 05/20/2019   Mixed hyperlipidemia 05/20/2019   Vertigo 04/25/2019   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   OSA (obstructive sleep apnea) 02/24/2019   Allergic rhinitis 02/24/2019   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   Morbid obesity with alveolar hypoventilation ([place]) 07/06/2018   Obesity (BMI 30-39.9) 07/06/2018   At risk for adverse drug event 07/06/2018   Cardiomegaly 64/68/0321   Non-alcoholic fatty liver disease 01/12/2018   Mild intermittent asthma 01/12/2018   Senile osteoporosis 12/15/2017   Fibromyalgia 03/19/2017   Migraine headache with aura 02/12/2016   Diabetic neuropathy associated with type 2 diabetes mellitus ([place]) 02/06/2016   Bipolar 1 disorder, manic, moderate ([place]) 10/04/2015   Depression, recurrent ([employer] [facility]) 10/03/2015   Herpes genitalis in women 07/16/2015   Long term current use of anticoagulant 05/30/2015   Family history of coronary arteriosclerosis 05/30/2015    Chronic diastolic heart failure ([place]) 05/30/2015   Dyspnea on exertion    Essential hypertension    Restless legs 04/27/2015   Lipoma 02/08/2015   Insomnia 01/23/2015   Chronic back pain 01/04/2015   Chronic atrial fibrillation ([place]) 01/04/2015    PCP: [name] MD   REFERRING PROVIDER: Kary Kos, MD  Next apt 01/14/22:  wishes to add PT for her back  REFERRING DIAG: M54.2 (ICD-10-CM) - Neck pain   THERAPY DIAG:  Cervicalgia  Abnormal posture  Rationale for Evaluation and Treatment Rehabilitation  ONSET DATE: Chronic  SUBJECTIVE:  SUBJECTIVE STATEMENT:  Pt reports increased dizziness today which has affected her neck with limitations a bit in movement. At rest no symptoms, but movement is the tough part. Improved sleeping "for some reason its been good".   PERTINENT HISTORY:  -Chronic LBP, Stage 3 Kidney disease, RT TSA, DM -Patient presents to therapy with complaint of chronic neck pain. This has been ongoing for several years, worsening over the past 2 years. No unknown mechanism of injury. She is seeing pain management. Notes chronic low back pain as well. She is currently managing symptoms with prescribed pain medication. No prior therapy for this issue. She has tried acupuncture with good result.    PAIN:  Are you having pain? Yes: NPRS scale: 6/10 Pain location: neck, upper back Pain description: "hard pain" like breaking bones, sore and stiffness Aggravating factors: sleeping  Relieving factors: stretching, meds   PRECAUTIONS: None  WEIGHT BEARING RESTRICTIONS No  FALLS:  Has patient fallen in last 6 months? No  LIVING ENVIRONMENT: Lives with: lives with their spouse Lives in: House/apartment  OCCUPATION: Retired   PLOF: Independent with basic  ADLs  PATIENT GOALS "Have less pain"  OBJECTIVE:   DIAGNOSTIC FINDINGS:  IMPRESSION: 1. Motion degraded exam. Overall grossly stable appearance of the cervical spine as compared to 04/23/2020. 2. Multilevel cervical spondylosis with small central/paracentral protrusions at C4-5 and C5-6. Secondary mild flattening of the ventral cord at these levels without cord signal changes. 3. Mild right C4 and left C5 foraminal stenosis, with mild to moderate left C7 foraminal narrowing, stable.    PATIENT SURVEYS:  FOTO 38% function   12/12/21: 51% improved functional ability   COGNITION: Overall cognitive status: Within functional limits for tasks assessed   POSTURE: rounded shoulders and forward head  PALPATION:   Moderate TTP about bilateral upper trap    CERVICAL ROM:   Active ROM A/PROM (deg) eval AROM  12/12/21  Flexion 50 50  Extension 37 50   Right lateral flexion    Left lateral flexion    Right rotation 42 60  Left rotation 42 51   (Blank rows = not tested)  UPPER EXTREMITY ROM:  Mod restriction in RT shoulder elevation, min restriction in LT shoulder elevation   UPPER EXTREMITY MMT:  MMT Right eval Left eval  Shoulder flexion    Shoulder extension    Shoulder abduction    Shoulder adduction    Shoulder extension    Shoulder internal rotation    Shoulder external rotation    Middle trapezius    Lower trapezius    Elbow flexion    Elbow extension    Wrist flexion    Wrist extension    Wrist ulnar deviation    Wrist radial deviation    Wrist pronation    Wrist supination    Grip strength     (Blank rows = not tested)   TODAY'S TREATMENT:  12/17/21:   There-ex = Seated in quiet room   - seated chin tucks 15 reps x 3 sec hold   - W back with cue for breathing, 15 reps    - B shoulder ER with scap squeeze with breathing x 15 reps   - Cervical rotation with eye spot forward - D/C secondary to dizziness   - Cervical AROM flexion/extension x 10  reps   - Shoulder posterior rolls x 10 reps   - Cervical lateral flexion opening x 10 reps B    - Shoulder rows verse blue theraband x  10 reps   Standing   - Postural hold with gentle B UE press back x 15 reps with breath x 2 rounds   12/12/21: 3D cervical excursion 10x each FOTO UBE 2' fwd/2' Retro STM in seated position UT, levator and Lt scalenes with AROM Chin tucks 15x 3" Cervical ROM measurement Wback 15x  12/04/2021 UBE x 2/2 lv1 FWD/ Retro Chin tuck 15 x 3" Scap retraction 15 x 3" BTB rows 2 x10 BTB shoulder extension 2 x10 Shoulder bilateral ER BTB 2 x 10 Seated UT stretch 3 x 20" Seated levator stretch 3 x 20"    Manual IASTM to bilateral upper trap and levator (seated)   Seated Seated cervical retractions 2 x 10 Seated scapular retractions 2 x 10 Seated posterior shoulder rolls 2 x 10 reps Seated blue physioball rolls x 1' Seated RTB scapular retraction 2 x 10 Seated RTB shoulder horizontal abduction 2 x 10 Upper trap stretch 10 sec holds x 3 each  STM x 10 min to bilateral upper trap and cervical spine, paraspinals.  No other treatment performed during manual treatment.  11/28/21 Upper trap stretch 10 sec holds x3 Levator stretch 10 sec x3 Mob to spinous process C4 with left lateral flexion following mulligan concept Self SNAG for cervical extension and rotation following mulligan concept. X10 each STM to sub occipital and upper trap region Seated cervical retractions x15  Seated posterior shoulder rolls x8 reps Seated physioball forward rolls x5   11/26/21 3D Cervical excursions x10 each  Posterior shoulder rolls x 20  Scap retraction 15 x 5" Chin tuck 15 x 5" Upper trap stretch 3 x20" Levator stretch 3 x 20"  W back 15 x 5"     IASTM to bilateral upper trap and levator, patient seated   11/21/21 Seated 3D cervical excursions x 10 each  Posterior shoulder rolls x 20  Scap retraction 15 x 5" Chin tuck 15 x 5" Upper trap stretch 3 x20" Levator  stretch 3 x 20"      IASTM to bilateral upper trap and levator, patient seated    11/06/21:  Seated: 3D cervical excursion all directions in pain free range 10x each   RTB rows   Wback   Chin tuck 15x 5"   Shoulder rolls posterior (up, back and down)   Upper trap stretch R/L 2x 30" each   Levator scapula 2x 30"   Educated log rolling for back pain   Supine: STM to Bil UT L>R, levator, scalenes    2 sets x 86mn cervical traction and suboccipital release  11/04/21  Upper trap stretch b/l 10 sec holds x3 b/l   Serratus stretch b/l 10 sec holds x3 b/l   Scap rolls in seated x10  Chin tuck x15  STM to sub occipital and upper trap region  Mob to spinous process C4 with left lateral flexion following mulligan concept  Self SNAG for cervical extension and rotation following mulligan concept.     10/31/21 Scap retraction Chic tuck Upper trap stretch    PATIENT EDUCATION:  Education details:HEP updated 6/5 Person educated: Patient Education method: Explanation Education comprehension: verbalized understanding   HOME EXERCISE PROGRAM: URL: [email or web address] Access Code: [email or web address] 11/06/21 Prepared by: CIhor Austin LPTA/CLT; CBIS Wback 3x 10 5" holds    Access Code: [email or web address] URL: [email or web address] Date: 11/04/2021 Prepared by: JLeota Jacobsen Exercises - Seated Levator Scapulae Stretch  - 2 x daily - 7 x weekly - 1 sets - 3 reps - 10sec  hold - Shoulder Rolls in Sitting  - 2 x daily - 7 x weekly - 2 sets - 10 reps - Seated Assisted Cervical Rotation with Towel  - 2 x daily - 7 x weekly - 1 sets - 3 reps - Upper Cervical Extension SNAG with Strap  - 2 x daily - 7 x weekly - 1 sets - 5 reps  Access Code: [email or web address] URL: [email or web address] Date: 10/31/2021 Prepared by: Josue Hector  Exercises - Seated Scapular Retraction  - 3 x daily - 7 x weekly - 2 sets - 10 reps - 3 second hold - Seated Cervical Retraction  - 3 x daily  - 7 x weekly - 2 sets - 10 reps - 3 second hold - Seated Upper Trapezius Stretch  - 3 x daily - 7 x weekly - 1 sets - 3 reps - 30 second hold  ASSESSMENT:  CLINICAL IMPRESSION: Today's session continued to focus on postural and cervical support. She was limited secondary to current statements of dizziness especially noted in trial of opening cervical rotation.  Thus, most of session focused on cervical central actions in posture whenever dizzy symptoms arose.  Patient demonstrated good ability to hold thoracic support in postural actions today.  At end of session, patient with high fatigue, re-assess post tolerance next visit to adjust session as needed.  Patient is a good candidate to continue with postural and strengthening sessions as documented in re-assessment last visit.     OBJECTIVE IMPAIRMENTS decreased activity tolerance, decreased mobility, decreased ROM, decreased strength, hypomobility, increased fascial restrictions, increased muscle spasms, impaired flexibility, impaired UE functional use, improper body mechanics, postural dysfunction, and pain.   ACTIVITY LIMITATIONS carrying, lifting, sleeping, bathing, dressing, reach over head, hygiene/grooming, and locomotion level  PARTICIPATION LIMITATIONS: cleaning, laundry, driving, shopping, community activity, and yard work  PERSONAL FACTORS  Chronic pain Hx  are also affecting patient's functional outcome.   REHAB POTENTIAL: Good  CLINICAL DECISION MAKING: Stable/uncomplicated  EVALUATION COMPLEXITY: Low   GOALS: SHORT TERM GOALS: Target date: 11/21/2021  Patient will be independent with initial HEP and self-management strategies to improve functional outcomes Baseline: 7/13:  Reports compliance with HEP daily. Goal status: Met   LONG TERM GOALS: Target date: 12/12/2021  Patient will be independent with advanced HEP and self-management strategies to improve functional outcomes Baseline: 7/13:  Reports compliance with  advanced HEP daily. Goal status: Ongoing  2.  Patient will improve FOTO score to predicted value to indicate improvement in functional outcomes (FOTO predicated value of 46) Baseline: eval: 38%  12/12/21: 51% Goal status: Met  3.  Patient will demo improved bilateral cervical rotation by 10 degrees in order to improve ability to scan environment for safety and while driving. Baseline: See AROM  Goal status: Ongoing  4. Patient will report a decrease in neck pain to no more than 3/10 for improved quality of life and ability to perform UE ADLs  Baseline:Eval: 8/10.  7/13:  Neck pain range from 4/10 to 7/10. Goal status: Ongoing  PLAN: PT FREQUENCY: 2x/week  PT DURATION: 6 weeks Continue 2x a week for 4 more weeks due to pt only having 9 treatments and all goals have not been met.   PLANNED INTERVENTIONS: Therapeutic exercises, Therapeutic activity, Neuromuscular re-education, Balance training, Gait training, Patient/Family education, Joint manipulation, Joint mobilization, Stair training, Aquatic Therapy, Dry Needling, Electrical stimulation, Spinal manipulation, Spinal mobilization, Cryotherapy, Moist heat, scar mobilization, Taping, Traction, Ultrasound, Biofeedback, Ionotophoresis 19m/ml Dexamethasone, and Manual therapy.   PLAN FOR  NEXT SESSION:  Continue 3 more session with focus on mobility and posture. Assess post tolerance to prior visit    4:42 PM, 12/17/21  Margarette Asal. Carlis Abbott PT, DPT  Contract Physical Therapist at  [facility] [phone]

## 2021-12-17 NOTE — Addendum Note (Signed)
Addended by: Leeroy Cha on: 12/17/2021 11:56 AM   Modules accepted: Orders

## 2021-12-18 ENCOUNTER — Encounter (HOSPITAL_COMMUNITY): Payer: Medicare Other | Admitting: Physical Therapy

## 2021-12-26 ENCOUNTER — Telehealth: Payer: Self-pay

## 2021-12-26 NOTE — Telephone Encounter (Signed)
Filled  Written  ID  Drug  QTY  Days  Prescriber  RX #  Dispenser  Refill  Daily Dose*  Pymt Type  PMP  12/18/2021 10/30/2021 1  Lorazepam 1 Mg Tablet 60.00 30 Ge Plo [id number] Exp (4317) 1/3 2.00 LMET Comm Ins [place] 11/24/2021 09/04/2021 2  Pregabalin 200 Mg Capsule 60.00 30 Me Lov [id number] [facility] (0327) 2/5 2.68 LMET Comm Ins [place] 11/18/2021 11/18/2021 2  Hydromorphone 2 Mg Tablet 120.00 30 Me Lov [id number] [facility] (0327) 0/0 40.00 MMET Comm Ins [place]

## 2021-12-27 ENCOUNTER — Encounter: Payer: Self-pay | Admitting: Adult Health

## 2021-12-27 ENCOUNTER — Ambulatory Visit (INDEPENDENT_AMBULATORY_CARE_PROVIDER_SITE_OTHER): Payer: Medicare Other | Admitting: Adult Health

## 2021-12-27 VITALS — BP 144/87 | HR 90 | Ht 62.0 in | Wt 188.0 lb

## 2021-12-27 DIAGNOSIS — I2510 Atherosclerotic heart disease of native coronary artery without angina pectoris: Secondary | ICD-10-CM | POA: Diagnosis not present

## 2021-12-27 DIAGNOSIS — B369 Superficial mycosis, unspecified: Secondary | ICD-10-CM | POA: Diagnosis not present

## 2021-12-27 MED ORDER — HYDROMORPHONE HCL 2 MG PO TABS: 2.0000 mg | ORAL_TABLET | Freq: Four times a day (QID) | ORAL | 0 refills | Status: DC | PRN

## 2021-12-27 MED ORDER — NYSTATIN 100000 UNIT/GM EX OINT: 1.0000 | TOPICAL_OINTMENT | Freq: Two times a day (BID) | CUTANEOUS | 3 refills | Status: DC

## 2021-12-27 NOTE — Progress Notes (Signed)
  Subjective:     Patient ID: Amber Stephenson, female   DOB: 1945/11/29, 76 y.o.   MRN: [id number]  HPI Amber Stephenson is a 76 year old white female,married, PM, in wanting to talk about meds, she has a tube of ketoconazole from foot doctor from 2022, and wants to know if can use in groin area, and could but I will recheck groin area for skin fungus.  She is better that when seen last in June. PCP is Dr Livia Snellen.   Review of Systems Itches in groin at times Reviewed past medical,surgical, social and family history. Reviewed medications and allergies.     Objective:   Physical Exam BP (!) 144/87 (BP Location: Left Arm, Patient Position: Sitting, Cuff Size: Normal)   Pulse 90   Ht '5\' 2"'$  (1.575 m)   Wt 188 lb (85.3 kg)   BMI 34.39 kg/m     Skin warm and dry.Lungs: clear to ausculation bilaterally. Cardiovascular: regular rate and rhythm.  Has some redness left groin.  Up[place] - 12/27/21 1302       Pregnancy Intention Screening   Does the patient want to become pregnant in the next year? No    Does the patient's partner want to become pregnant in the next year? No    Would the patient like to discuss contraceptive options today? No      Contraception Wrap Up   Current Method No Method - Other Reason   postmenopausal   End Method No Method - Other Reason   postmenopausal   Contraception Counseling Provided No             Assessment:     1. Superficial fungus infection of skin Will rx nystatin ointment , she did not like the powder so much Keep clean and dry Meds ordered this encounter  Medications   nystatin ointment (MYCOSTATIN)    Sig: Apply 1 Application topically 2 (two) times daily.    Dispense:  30 g    Refill:  3    Order Specific Question:   Supervising Provider    Answer:   Florian Buff [[id number]]       Plan:     Follow up prn

## 2021-12-30 DIAGNOSIS — I129 Hypertensive chronic kidney disease with stage 1 through stage 4 chronic kidney disease, or unspecified chronic kidney disease: Secondary | ICD-10-CM | POA: Diagnosis not present

## 2021-12-30 DIAGNOSIS — Z1283 Encounter for screening for malignant neoplasm of skin: Secondary | ICD-10-CM | POA: Diagnosis not present

## 2021-12-30 DIAGNOSIS — E6609 Other obesity due to excess calories: Secondary | ICD-10-CM | POA: Diagnosis not present

## 2021-12-30 DIAGNOSIS — E1129 Type 2 diabetes mellitus with other diabetic kidney complication: Secondary | ICD-10-CM | POA: Diagnosis not present

## 2021-12-30 DIAGNOSIS — D485 Neoplasm of uncertain behavior of skin: Secondary | ICD-10-CM | POA: Diagnosis not present

## 2021-12-30 DIAGNOSIS — R809 Proteinuria, unspecified: Secondary | ICD-10-CM | POA: Diagnosis not present

## 2021-12-30 DIAGNOSIS — D225 Melanocytic nevi of trunk: Secondary | ICD-10-CM | POA: Diagnosis not present

## 2021-12-30 DIAGNOSIS — E1122 Type 2 diabetes mellitus with diabetic chronic kidney disease: Secondary | ICD-10-CM | POA: Diagnosis not present

## 2021-12-30 DIAGNOSIS — N189 Chronic kidney disease, unspecified: Secondary | ICD-10-CM | POA: Diagnosis not present

## 2021-12-31 DIAGNOSIS — M722 Plantar fascial fibromatosis: Secondary | ICD-10-CM | POA: Diagnosis not present

## 2021-12-31 DIAGNOSIS — M79671 Pain in right foot: Secondary | ICD-10-CM | POA: Diagnosis not present

## 2022-01-01 ENCOUNTER — Encounter (HOSPITAL_COMMUNITY): Payer: Self-pay | Admitting: Physical Therapy

## 2022-01-01 ENCOUNTER — Ambulatory Visit (HOSPITAL_COMMUNITY): Payer: Medicare Other | Attending: Neurosurgery | Admitting: Physical Therapy

## 2022-01-01 DIAGNOSIS — R2689 Other abnormalities of gait and mobility: Secondary | ICD-10-CM | POA: Insufficient documentation

## 2022-01-01 DIAGNOSIS — M542 Cervicalgia: Secondary | ICD-10-CM | POA: Insufficient documentation

## 2022-01-01 DIAGNOSIS — R293 Abnormal posture: Secondary | ICD-10-CM | POA: Insufficient documentation

## 2022-01-01 NOTE — Therapy (Signed)
OUTPATIENT PHYSICAL THERAPY CERVICAL TREATMENT    Patient Name: Amber Stephenson MRN: [id number] DOB:07/18/1945, 76 y.o., female Today's Date: 01/01/2022   PT End of Session - 01/01/22 1346     Visit Number 11    Number of Visits 12    Date for PT Re-Evaluation 01/12/22    Authorization Type Medicare A; [employer]    Progress Note Due on Visit 30    PT Start Time 1346    PT Stop Time 1425    PT Time Calculation (min) 39 min    Activity Tolerance Patient limited by fatigue    Behavior During Therapy [facility] for tasks assessed/performed                 Past Medical History:  Diagnosis Date   Acute exacerbation of CHF (congestive heart failure) ([place]) 12/13/2020   Acute on chronic diastolic CHF (congestive heart failure)/HFpEF Exacerbation 12/16/2020   Acute on chronic respiratory failure with hypoxia ([place]) 01/22/2019   Acute respiratory failure with hypoxia ([place]) 12/16/2020   Asthma    Atrial fibrillation with RVR 05/19/2017   Bipolar I disorder 01/23/2015   CAD (coronary artery disease)    Nonobstructive; Managed by Dr. Bronson Ing   Cardiomegaly 01/12/2018   Chronic anticoagulation 05/30/2015   Chronic atrial fibrillation ([place]) 01/04/2015   Chronic back pain    Lower back   Chronic diastolic CHF (congestive heart failure) 05/30/2015   Diabetes mellitus, type 2, without complication    Diabetic neuropathy 02/06/2016   Dysrhythmia    A-Fib   Essential hypertension    Herpes genitalis in women 07/16/2015   Hyperlipidemia    Hypoxia 02/01/2019   Insomnia 01/23/2015   Lipoma 02/08/2015   Major depressive disorder    Migraine headache with aura 02/12/2016   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   NAFL (nonalcoholic fatty liver) 53/97/6734   OSA (obstructive sleep apnea) 02/24/2019   10/09/2018 - HST  - AHI 40.6    Pulmonary hypertension    Renal insufficiency    Managed by Dr. Wallace Keller   RLS (restless legs syndrome) 04/27/2015   Past Surgical History:   Procedure Laterality Date   BREAST REDUCTION SURGERY     EYE SURGERY Right    cateracts   HAMMER TOE SURGERY     LEFT HEART CATH AND CORONARY ANGIOGRAPHY N/A 02/03/2018   Procedure: LEFT HEART CATH AND CORONARY ANGIOGRAPHY;  Surgeon: Martinique, Peter M, MD;  Location: [place] [facility];  Service: Cardiovascular;  Laterality: N/A;   REVERSE SHOULDER ARTHROPLASTY Right 08/19/2019   Procedure: REVERSE SHOULDER ARTHROPLASTY;  Surgeon: Netta Cedars, MD;  Location: [facility];  Service: Orthopedics;  Laterality: Right;  interscalene block   SHOULDER SURGERY Right    "I BROKE MY SHOUDLER   THIGH SURGERY     "TO REMOVE A TUMOR "   Patient Active Problem List   Diagnosis Date Noted   Tremor, essential 12/11/2021   Superficial fungus infection of skin 09/03/2021   Chronic right-sided low back pain with right-sided sciatica 07/19/2021   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   Vaginal itching 04/16/2021   Dysuria 04/16/2021   Sensory ataxia 03/28/2021   Yeast infection 01/14/2021   UTI (urinary tract infection) 12/14/2020   Elevated brain natriuretic peptide (BNP) level 12/14/2020   Elevated troponin 12/14/2020   Chronic post-traumatic stress disorder 12/06/2020   Tremor 12/06/2020   Senile nuclear sclerosis 12/06/2020   Senile corneal changes 12/06/2020   Psychoses ([place]) 12/06/2020   Presbyopia  12/06/2020   Postmenopausal bleeding 12/06/2020   Postcoital bleeding 12/06/2020   Pinguecula 12/06/2020   Pain in joint, shoulder region 12/06/2020   Other psychological or physical stress 12/06/2020   Other acquired hammer toe 12/06/2020   Osteopenia 12/06/2020   Opioid dependence ([place]) 12/06/2020   Non-neoplastic nevus 12/06/2020   Essential hypertriglyceridemia 12/06/2020   Hypermetropia 12/06/2020   Debility 12/06/2020   Cystitis 12/06/2020   Depressed bipolar I disorder in full remission ([place]) 12/06/2020   Benzodiazepine dependence ([place]) 12/06/2020   Benign neoplasm of skin 12/06/2020    Anxiety 12/06/2020   Functional diarrhea 10/26/2020   Diabetes mellitus due to underlying condition with hyperglycemia, with long-term current use of insulin ([place]) 09/24/2020   Dyslipidemia 09/24/2020   Head trauma 09/17/2020   Chronic respiratory failure with hypoxia ([place]) 04/25/2020   Chronic constipation 04/25/2020   Back pain/sacroiliitis--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   Ambulatory dysfunction--back pain and falls 04/23/2020   Chronic pain syndrome 08/22/2019   Nerve pain 08/22/2019   Myofascial pain dysfunction syndrome 08/22/2019   CAD (coronary artery disease)    Hypocalcemia    S/P shoulder replacement, 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   History of adenomatous polyp of colon 05/21/2019   Polypharmacy 05/21/2019   Benign paroxysmal positional vertigo due to bilateral vestibular disorder 05/20/2019   Mixed hyperlipidemia 05/20/2019   Vertigo 04/25/2019   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   OSA (obstructive sleep apnea) 02/24/2019   Allergic rhinitis 02/24/2019   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   Morbid obesity with alveolar hypoventilation ([place]) 07/06/2018   Obesity (BMI 30-39.9) 07/06/2018   At risk for adverse drug event 07/06/2018   Cardiomegaly 83/66/2947   Non-alcoholic fatty liver disease 01/12/2018   Mild intermittent asthma 01/12/2018   Senile osteoporosis 12/15/2017   Fibromyalgia 03/19/2017   Migraine headache with aura 02/12/2016   Diabetic neuropathy associated with type 2 diabetes mellitus ([place]) 02/06/2016   Bipolar 1 disorder, manic, moderate ([place]) 10/04/2015   Depression, recurrent ([place]) 10/03/2015   Herpes genitalis in women 07/16/2015   Long term current use of anticoagulant 05/30/2015   Family history of coronary arteriosclerosis 05/30/2015   Chronic diastolic heart failure ([place]) 05/30/2015   Dyspnea on exertion    Essential hypertension    Restless legs 04/27/2015   Lipoma  02/08/2015   Insomnia 01/23/2015   Chronic back pain 01/04/2015   Chronic atrial fibrillation ([place]) 01/04/2015    PCP: [name] MD   REFERRING PROVIDER: Kary Kos, MD  Next apt 01/14/22:  wishes to add PT for her back  REFERRING DIAG: M54.2 (ICD-10-CM) - Neck pain   THERAPY DIAG:  Cervicalgia  Abnormal posture  Other abnormalities of gait and mobility  Rationale for Evaluation and Treatment Rehabilitation  ONSET DATE: Chronic  SUBJECTIVE:  SUBJECTIVE STATEMENT:  Patient states continued symptoms in occiput region. Exercises seem to help. Has been doing HEP. Hasnt been back in a few weeks because they did not have any appointments.   PERTINENT HISTORY:  -Chronic LBP, Stage 3 Kidney disease, RT TSA, DM -Patient presents to therapy with complaint of chronic neck pain. This has been ongoing for several years, worsening over the past 2 years. No unknown mechanism of injury. She is seeing pain management. Notes chronic low back pain as well. She is currently managing symptoms with prescribed pain medication. No prior therapy for this issue. She has tried acupuncture with good result.    PAIN:  Are you having pain? Yes: NPRS scale: 4/10 Pain location: neck, upper back Pain description: "hard pain" like breaking bones, sore and stiffness Aggravating factors: sleeping  Relieving factors: stretching, meds   PRECAUTIONS: None  WEIGHT BEARING RESTRICTIONS No  FALLS:  Has patient fallen in last 6 months? No  LIVING ENVIRONMENT: Lives with: lives with their spouse Lives in: House/apartment  OCCUPATION: Retired   PLOF: Independent with basic ADLs  PATIENT GOALS "Have less pain"  OBJECTIVE:   DIAGNOSTIC FINDINGS:  IMPRESSION: 1. Motion degraded exam. Overall grossly  stable appearance of the cervical spine as compared to 04/23/2020. 2. Multilevel cervical spondylosis with small central/paracentral protrusions at C4-5 and C5-6. Secondary mild flattening of the ventral cord at these levels without cord signal changes. 3. Mild right C4 and left C5 foraminal stenosis, with mild to moderate left C7 foraminal narrowing, stable.    PATIENT SURVEYS:  FOTO 38% function   12/12/21: 51% improved functional ability   COGNITION: Overall cognitive status: Within functional limits for tasks assessed   POSTURE: rounded shoulders and forward head  PALPATION:   Moderate TTP about bilateral upper trap    CERVICAL ROM:   Active ROM A/PROM (deg) eval AROM  12/12/21  Flexion 50 50  Extension 37 50   Right lateral flexion    Left lateral flexion    Right rotation 42 60  Left rotation 42 51   (Blank rows = not tested)  UPPER EXTREMITY ROM:  Mod restriction in RT shoulder elevation, min restriction in LT shoulder elevation   UPPER EXTREMITY MMT:  MMT Right eval Left eval  Shoulder flexion    Shoulder extension    Shoulder abduction    Shoulder adduction    Shoulder extension    Shoulder internal rotation    Shoulder external rotation    Middle trapezius    Lower trapezius    Elbow flexion    Elbow extension    Wrist flexion    Wrist extension    Wrist ulnar deviation    Wrist radial deviation    Wrist pronation    Wrist supination    Grip strength     (Blank rows = not tested)   TODAY'S TREATMENT:  01/01/22 UBE 2 minutes for warm up  Seated cervical retractions 15 x 3 second holds W back with cue for breathing, 15 reps  Posterior shoulder rolls 2 x 10  Cervical lateral flexion opening x 10 reps B 5-10 second  holds Standing Row BTB 2x 10  Standing shoulder extension BTB 2x 10 Cervical retraction isometric with towel at wall 5x 5 second holds  Manual: STM to suboccipitals and cervical paraspinals R>L   12/17/21:   There-ex =  Seated in quiet room   - seated chin tucks 15 reps x 3 sec hold   - W back with  cue for breathing, 15 reps    - B shoulder ER with scap squeeze with breathing x 15 reps   - Cervical rotation with eye spot forward - D/C secondary to dizziness   - Cervical AROM flexion/extension x 10 reps   - Shoulder posterior rolls x 10 reps   - Cervical lateral flexion opening x 10 reps B    - Shoulder rows verse blue theraband x 10 reps   Standing   - Postural hold with gentle B UE press back x 15 reps with breath x 2 rounds   12/12/21: 3D cervical excursion 10x each FOTO UBE 2' fwd/2' Retro STM in seated position UT, levator and Lt scalenes with AROM Chin tucks 15x 3" Cervical ROM measurement Wback 15x  12/04/2021 UBE x 2/2 lv1 FWD/ Retro Chin tuck 15 x 3" Scap retraction 15 x 3" BTB rows 2 x10 BTB shoulder extension 2 x10 Shoulder bilateral ER BTB 2 x 10 Seated UT stretch 3 x 20" Seated levator stretch 3 x 20"    Manual IASTM to bilateral upper trap and levator (seated)   Seated Seated cervical retractions 2 x 10 Seated scapular retractions 2 x 10 Seated posterior shoulder rolls 2 x 10 reps Seated blue physioball rolls x 1' Seated RTB scapular retraction 2 x 10 Seated RTB shoulder horizontal abduction 2 x 10 Upper trap stretch 10 sec holds x 3 each  STM x 10 min to bilateral upper trap and cervical spine, paraspinals.  No other treatment performed during manual treatment.  11/28/21 Upper trap stretch 10 sec holds x3 Levator stretch 10 sec x3 Mob to spinous process C4 with left lateral flexion following mulligan concept Self SNAG for cervical extension and rotation following mulligan concept. X10 each STM to sub occipital and upper trap region Seated cervical retractions x15  Seated posterior shoulder rolls x8 reps Seated physioball forward rolls x5   11/26/21 3D Cervical excursions x10 each  Posterior shoulder rolls x 20  Scap retraction 15 x 5" Chin tuck 15 x 5" Upper trap  stretch 3 x20" Levator stretch 3 x 20"  W back 15 x 5"     IASTM to bilateral upper trap and levator, patient seated   11/21/21 Seated 3D cervical excursions x 10 each  Posterior shoulder rolls x 20  Scap retraction 15 x 5" Chin tuck 15 x 5" Upper trap stretch 3 x20" Levator stretch 3 x 20"      IASTM to bilateral upper trap and levator, patient seated    11/06/21:  Seated: 3D cervical excursion all directions in pain free range 10x each   RTB rows   Wback   Chin tuck 15x 5"   Shoulder rolls posterior (up, back and down)   Upper trap stretch R/L 2x 30" each   Levator scapula 2x 30"   Educated log rolling for back pain   Supine: STM to Bil UT L>R, levator, scalenes    2 sets x 66mn cervical traction and suboccipital release  11/04/21  Upper trap stretch b/l 10 sec holds x3 b/l   Serratus stretch b/l 10 sec holds x3 b/l   Scap rolls in seated x10  Chin tuck x15  STM to sub occipital and upper trap region  Mob to spinous process C4 with left lateral flexion following mulligan concept  Self SNAG for cervical extension and rotation following mulligan concept.     10/31/21 Scap retraction Chic tuck Upper trap stretch    PATIENT EDUCATION:  Education details:HEP  updated 6/5 Person educated: Patient Education method: Explanation Education comprehension: verbalized understanding   HOME EXERCISE PROGRAM: URL: [email or web address] Access Code: [email or web address] Date 11/06/21 Prepared by: Ihor Austin, LPTA/CLT; CBIS Wback 3x 10 5" holds    Access Code: [email or web address] URL: [email or web address] Date: 11/04/2021 Prepared by: Leota Jacobsen  Exercises - Seated Levator Scapulae Stretch  - 2 x daily - 7 x weekly - 1 sets - 3 reps - 10sec hold - Shoulder Rolls in Sitting  - 2 x daily - 7 x weekly - 2 sets - 10 reps - Seated Assisted Cervical Rotation with Towel  - 2 x daily - 7 x weekly - 1 sets - 3 reps - Upper Cervical Extension SNAG with Strap  - 2 x daily - 7 x  weekly - 1 sets - 5 reps  Access Code: [email or web address] URL: [email or web address] Date: 10/31/2021 Prepared by: Josue Hector  Exercises - Seated Scapular Retraction  - 3 x daily - 7 x weekly - 2 sets - 10 reps - 3 second hold - Seated Cervical Retraction  - 3 x daily - 7 x weekly - 2 sets - 10 reps - 3 second hold - Seated Upper Trapezius Stretch  - 3 x daily - 7 x weekly - 1 sets - 3 reps - 30 second hold  ASSESSMENT:  CLINICAL IMPRESSION: Patient tolerates previously completed exercises well with minimal to no cueing required. She demonstrates good cervical retraction mechanics. Added band exercises for continued postural strengthening. Patient with hyperactive and tender suboccipitals with decrease in tissue tension and symptoms following manual. Patient will continue to benefit from physical therapy in order to improve function and reduce impairment.    OBJECTIVE IMPAIRMENTS decreased activity tolerance, decreased mobility, decreased ROM, decreased strength, hypomobility, increased fascial restrictions, increased muscle spasms, impaired flexibility, impaired UE functional use, improper body mechanics, postural dysfunction, and pain.   ACTIVITY LIMITATIONS carrying, lifting, sleeping, bathing, dressing, reach over head, hygiene/grooming, and locomotion level  PARTICIPATION LIMITATIONS: cleaning, laundry, driving, shopping, community activity, and yard work  PERSONAL FACTORS  Chronic pain Hx  are also affecting patient's functional outcome.   REHAB POTENTIAL: Good  CLINICAL DECISION MAKING: Stable/uncomplicated  EVALUATION COMPLEXITY: Low   GOALS: SHORT TERM GOALS: Target date: 11/21/2021  Patient will be independent with initial HEP and self-management strategies to improve functional outcomes Baseline: 7/13:  Reports compliance with HEP daily. Goal status: Met   LONG TERM GOALS: Target date: 12/12/2021  Patient will be independent with advanced HEP and  self-management strategies to improve functional outcomes Baseline: 7/13:  Reports compliance with advanced HEP daily. Goal status: Ongoing  2.  Patient will improve FOTO score to predicted value to indicate improvement in functional outcomes (FOTO predicated value of 46) Baseline: eval: 38%  12/12/21: 51% Goal status: Met  3.  Patient will demo improved bilateral cervical rotation by 10 degrees in order to improve ability to scan environment for safety and while driving. Baseline: See AROM  Goal status: Ongoing  4. Patient will report a decrease in neck pain to no more than 3/10 for improved quality of life and ability to perform UE ADLs  Baseline:Eval: 8/10.  7/13:  Neck pain range from 4/10 to 7/10. Goal status: Ongoing  PLAN: PT FREQUENCY: 2x/week  PT DURATION: 6 weeks Continue 2x a week for 4 more weeks due to pt only having 9 treatments and all goals have not been met.   PLANNED INTERVENTIONS: Therapeutic exercises, Therapeutic activity, Neuromuscular re-education, Balance training, Gait training, Patient/Family education, Joint  manipulation, Joint mobilization, Stair training, Aquatic Therapy, Dry Needling, Electrical stimulation, Spinal manipulation, Spinal mobilization, Cryotherapy, Moist heat, scar mobilization, Taping, Traction, Ultrasound, Biofeedback, Ionotophoresis 64m/ml Dexamethasone, and Manual therapy.   PLAN FOR NEXT SESSION:  Continue 3 more session with focus on mobility and posture. Assess post tolerance to prior visit   1:47 PM, 01/01/22 AMearl LatinPT, DPT Physical Therapist [facility]

## 2022-01-02 DIAGNOSIS — F251 Schizoaffective disorder, depressive type: Secondary | ICD-10-CM | POA: Diagnosis not present

## 2022-01-07 ENCOUNTER — Ambulatory Visit (HOSPITAL_COMMUNITY): Payer: Medicare Other | Admitting: Physical Therapy

## 2022-01-07 ENCOUNTER — Other Ambulatory Visit: Payer: Self-pay | Admitting: Family Medicine

## 2022-01-07 ENCOUNTER — Encounter (HOSPITAL_COMMUNITY): Payer: Self-pay | Admitting: Physical Therapy

## 2022-01-07 DIAGNOSIS — R2689 Other abnormalities of gait and mobility: Secondary | ICD-10-CM | POA: Diagnosis not present

## 2022-01-07 DIAGNOSIS — R293 Abnormal posture: Secondary | ICD-10-CM

## 2022-01-07 DIAGNOSIS — M542 Cervicalgia: Secondary | ICD-10-CM | POA: Diagnosis not present

## 2022-01-07 DIAGNOSIS — M5440 Lumbago with sciatica, unspecified side: Secondary | ICD-10-CM | POA: Diagnosis not present

## 2022-01-07 MED ORDER — POTASSIUM CHLORIDE CRYS ER 20 MEQ PO TBCR
20.0000 meq | EXTENDED_RELEASE_TABLET | Freq: Every day | ORAL | 5 refills | Status: DC
Start: 2022-01-07 — End: 2022-01-27

## 2022-01-07 NOTE — Telephone Encounter (Signed)
  Prescription Request  01/07/2022  Is this a "Controlled Substance" medicine? potassium chloride SA (KLOR-CON) 20 MEQ tablet  Have you seen your PCP in the last 2 weeks? 12/11/2021  If YES, route message to pool  -  If NO, patient needs to be scheduled for appointment.  What is the name of the medication or equipment? potassium chloride SA (KLOR-CON) 20 MEQ tablet  Have you contacted your pharmacy to request a refill? no   Which pharmacy would you like this sent to? [facility] [address]  I asked pt if she contacted Ok Edwards, NP to refill and she said no. The bottle has Stacks on it.   Patient notified that their request is being sent to the clinical staff for review and that they should receive a response within 2 business days.

## 2022-01-07 NOTE — Therapy (Signed)
OUTPATIENT PHYSICAL THERAPY CERVICAL TREATMENT    Patient Name: Amber Stephenson MRN: [id number] DOB:July 04, 1945, 76 y.o., female Today's Date: 01/07/2022  PHYSICAL THERAPY DISCHARGE SUMMARY  Visits from Start of Care: 12  Current functional level related to goals / functional outcomes: See below   Remaining deficits: See below    Education / Equipment: See assessment    Patient agrees to discharge. Patient goals were partially met. Patient is being discharged due to being pleased with the current functional level.   PT End of Session - 01/07/22 1601     Visit Number 12    Number of Visits 12    Date for PT Re-Evaluation 01/12/22    Authorization Type Medicare A; [employer]    Progress Note Due on Visit 82    PT Start Time 1559    PT Stop Time 1627    PT Time Calculation (min) 28 min    Activity Tolerance Patient tolerated treatment well    Behavior During Therapy WFL for tasks assessed/performed                 Past Medical History:  Diagnosis Date   Acute exacerbation of CHF (congestive heart failure) ([place]) 12/13/2020   Acute on chronic diastolic CHF (congestive heart failure)/HFpEF Exacerbation 12/16/2020   Acute on chronic respiratory failure with hypoxia ([place]) 01/22/2019   Acute respiratory failure with hypoxia ([place]) 12/16/2020   Asthma    Atrial fibrillation with RVR 05/19/2017   Bipolar I disorder 01/23/2015   CAD (coronary artery disease)    Nonobstructive; Managed by Dr. Bronson Ing   Cardiomegaly 01/12/2018   Chronic anticoagulation 05/30/2015   Chronic atrial fibrillation ([place]) 01/04/2015   Chronic back pain    Lower back   Chronic diastolic CHF (congestive heart failure) 05/30/2015   Diabetes mellitus, type 2, without complication    Diabetic neuropathy 02/06/2016   Dysrhythmia    A-Fib   Essential hypertension    Herpes genitalis in women 07/16/2015   Hyperlipidemia    Hypoxia 02/01/2019   Insomnia 01/23/2015   Lipoma 02/08/2015    Major depressive disorder    Migraine headache with aura 02/12/2016   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   NAFL (nonalcoholic fatty liver) 51/88/4166   OSA (obstructive sleep apnea) 02/24/2019   10/09/2018 - HST  - AHI 40.6    Pulmonary hypertension    Renal insufficiency    Managed by Dr. Wallace Keller   RLS (restless legs syndrome) 04/27/2015   Past Surgical History:  Procedure Laterality Date   BREAST REDUCTION SURGERY     EYE SURGERY Right    cateracts   HAMMER TOE SURGERY     LEFT HEART CATH AND CORONARY ANGIOGRAPHY N/A 02/03/2018   Procedure: LEFT HEART CATH AND CORONARY ANGIOGRAPHY;  Surgeon: Martinique, Peter M, MD;  Location: [facility];  Service: Cardiovascular;  Laterality: N/A;   REVERSE SHOULDER ARTHROPLASTY Right 08/19/2019   Procedure: REVERSE SHOULDER ARTHROPLASTY;  Surgeon: Netta Cedars, MD;  Location: [facility];  Service: Orthopedics;  Laterality: Right;  interscalene block   SHOULDER SURGERY Right    "I BROKE MY SHOUDLER   THIGH SURGERY     "TO REMOVE A TUMOR "   Patient Active Problem List   Diagnosis Date Noted   Tremor, essential 12/11/2021   Superficial fungus infection of skin 09/03/2021   Chronic right-sided low back pain with right-sided sciatica 07/19/2021   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/2021   Vaginal itching 04/16/2021   Dysuria  04/16/2021   Sensory ataxia 03/28/2021   Yeast infection 01/14/2021   UTI (urinary tract infection) 12/14/2020   Elevated brain natriuretic peptide (BNP) level 12/14/2020   Elevated troponin 12/14/2020   Chronic post-traumatic stress disorder 12/06/2020   Tremor 12/06/2020   Senile nuclear sclerosis 12/06/2020   Senile corneal changes 12/06/2020   Psychoses ([place]) 12/06/2020   Presbyopia 12/06/2020   Postmenopausal bleeding 12/06/2020   Postcoital bleeding 12/06/2020   Pinguecula 12/06/2020   Pain in joint, shoulder region 12/06/2020   Other psychological or physical stress 12/06/2020   Other acquired  hammer toe 12/06/2020   Osteopenia 12/06/2020   Opioid dependence ([place]) 12/06/2020   Non-neoplastic nevus 12/06/2020   Essential hypertriglyceridemia 12/06/2020   Hypermetropia 12/06/2020   Debility 12/06/2020   Cystitis 12/06/2020   Depressed bipolar I disorder in full remission ([place]) 12/06/2020   Benzodiazepine dependence ([place]) 12/06/2020   Benign neoplasm of skin 12/06/2020   Anxiety 12/06/2020   Functional diarrhea 10/26/2020   Diabetes mellitus due to underlying condition with hyperglycemia, with long-term current use of insulin ([place]) 09/24/2020   Dyslipidemia 09/24/2020   Head trauma 09/17/2020   Chronic respiratory failure with hypoxia ([place]) 04/25/2020   Chronic constipation 04/25/2020   Back pain/sacroiliitis--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   Ambulatory dysfunction--back pain and falls 04/23/2020   Chronic pain syndrome 08/22/2019   Nerve pain 08/22/2019   Myofascial pain dysfunction syndrome 08/22/2019   CAD (coronary artery disease)    Hypocalcemia    S/P shoulder replacement, 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   History of adenomatous polyp of colon 05/21/2019   Polypharmacy 05/21/2019   Benign paroxysmal positional vertigo due to bilateral vestibular disorder 05/20/2019   Mixed hyperlipidemia 05/20/2019   Vertigo 04/25/2019   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   OSA (obstructive sleep apnea) 02/24/2019   Allergic rhinitis 02/24/2019   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   Morbid obesity with alveolar hypoventilation ([place]) 07/06/2018   Obesity (BMI 30-39.9) 07/06/2018   At risk for adverse drug event 07/06/2018   Cardiomegaly 16/03/9603   Non-alcoholic fatty liver disease 01/12/2018   Mild intermittent asthma 01/12/2018   Senile osteoporosis 12/15/2017   Fibromyalgia 03/19/2017   Migraine headache with aura 02/12/2016   Diabetic neuropathy associated with type 2 diabetes mellitus ([place]) 02/06/2016    Bipolar 1 disorder, manic, moderate ([place]) 10/04/2015   Depression, recurrent ([place]) 10/03/2015   Herpes genitalis in women 07/16/2015   Long term current use of anticoagulant 05/30/2015   Family history of coronary arteriosclerosis 05/30/2015   Chronic diastolic heart failure ([place]) 05/30/2015   Dyspnea on exertion    Essential hypertension    Restless legs 04/27/2015   Lipoma 02/08/2015   Insomnia 01/23/2015   Chronic back pain 01/04/2015   Chronic atrial fibrillation ([place]) 01/04/2015    PCP: [name] MD   REFERRING PROVIDER: Kary Kos, MD  Next apt 01/14/22:  wishes to add PT for her back  REFERRING DIAG: M54.2 (ICD-10-CM) - Neck pain   THERAPY DIAG:  Cervicalgia  Abnormal posture  Other abnormalities of gait and mobility  Rationale for Evaluation and Treatment Rehabilitation  ONSET DATE: Chronic  SUBJECTIVE:  SUBJECTIVE STATEMENT:  Patient states neck is doing much better. She has been having more problems with low back recently and met with MD this morning. She is getting referral for this next.    PERTINENT HISTORY:  -Chronic LBP, Stage 3 Kidney disease, RT TSA, DM -Patient presents to therapy with complaint of chronic neck pain. This has been ongoing for several years, worsening over the past 2 years. No unknown mechanism of injury. She is seeing pain management. Notes chronic low back pain as well. She is currently managing symptoms with prescribed pain medication. No prior therapy for this issue. She has tried acupuncture with good result.    PAIN:  Are you having pain? Yes: NPRS scale: 4/10 Pain location: neck, upper back Pain description: "hard pain" like breaking bones, sore and stiffness Aggravating factors: sleeping  Relieving factors: stretching, meds    PRECAUTIONS: None  WEIGHT BEARING RESTRICTIONS No  FALLS:  Has patient fallen in last 6 months? No  LIVING ENVIRONMENT: Lives with: lives with their spouse Lives in: House/apartment  OCCUPATION: Retired   PLOF: Independent with basic ADLs  PATIENT GOALS "Have less pain"  OBJECTIVE:   DIAGNOSTIC FINDINGS:  IMPRESSION: 1. Motion degraded exam. Overall grossly stable appearance of the cervical spine as compared to 04/23/2020. 2. Multilevel cervical spondylosis with small central/paracentral protrusions at C4-5 and C5-6. Secondary mild flattening of the ventral cord at these levels without cord signal changes. 3. Mild right C4 and left C5 foraminal stenosis, with mild to moderate left C7 foraminal narrowing, stable.    PATIENT SURVEYS:  FOTO 65% (was 38% function)      COGNITION: Overall cognitive status: Within functional limits for tasks assessed   POSTURE: rounded shoulders and forward head  PALPATION:   Moderate TTP about bilateral upper trap    CERVICAL ROM:   Active ROM A/PROM (deg) eval AROM  12/12/21 AROM 01/07/22  Flexion 50 50 52  Extension 37 50  42  Right lateral flexion     Left lateral flexion     Right rotation 42 60 61  Left rotation 42 51 60   (Blank rows = not tested)  UPPER EXTREMITY ROM:  Mod restriction in RT shoulder elevation, min restriction in LT shoulder elevation   UPPER EXTREMITY MMT:  MMT Right eval Left eval  Shoulder flexion    Shoulder extension    Shoulder abduction    Shoulder adduction    Shoulder extension    Shoulder internal rotation    Shoulder external rotation    Middle trapezius    Lower trapezius    Elbow flexion    Elbow extension    Wrist flexion    Wrist extension    Wrist ulnar deviation    Wrist radial deviation    Wrist pronation    Wrist supination    Grip strength     (Blank rows = not tested)  TODAY'S TREATMENT:   01/07/22 Reassess UBE lv1 3/3  FOTO AROM HEP review:  Chin  tuck x 5 Chick tuck with extension x5 Scap retraction x 5  UT stretch x30" each  Levator stretch x 30" each  Band Rows BTB x10 Shoulder extension BTB x 10   01/01/22 UBE 2 minutes for warm up  Seated cervical retractions 15 x 3 second holds W back with cue for breathing, 15 reps  Posterior shoulder rolls 2 x 10  Cervical lateral flexion opening x 10 reps B 5-10 second  holds Standing Row BTB 2x 10  Standing shoulder extension BTB 2x 10 Cervical retraction isometric with towel at wall 5x 5 second holds  Manual: STM to suboccipitals and cervical paraspinals R>L   12/17/21:   There-ex = Seated in quiet room   - seated chin tucks 15 reps x 3 sec hold   - W back with cue for breathing, 15 reps    - B shoulder ER with scap squeeze with breathing x 15 reps   - Cervical rotation with eye spot forward - D/C secondary to dizziness   - Cervical AROM flexion/extension x 10 reps   - Shoulder posterior rolls x 10 reps   - Cervical lateral flexion opening x 10 reps B    - Shoulder rows verse blue theraband x 10 reps   Standing   - Postural hold with gentle B UE press back x 15 reps with breath x 2 rounds   PATIENT EDUCATION:  Education details:HEP updated 6/5 Person educated: Patient Education method: Explanation Education comprehension: verbalized understanding   HOME EXERCISE PROGRAM: URL: [email or web address] Access Code: [email or web address] Date 11/06/21 Prepared by: Ihor Austin, LPTA/CLT; CBIS Wback 3x 10 5" holds    Access Code: [email or web address] URL: [email or web address] Date: 11/04/2021 Prepared by: Leota Jacobsen  Exercises - Seated Levator Scapulae Stretch  - 2 x daily - 7 x weekly - 1 sets - 3 reps - 10sec hold - Shoulder Rolls in Sitting  - 2 x daily - 7 x weekly - 2 sets - 10 reps - Seated Assisted Cervical Rotation with Towel  - 2 x daily - 7 x weekly - 1 sets - 3 reps - Upper Cervical Extension SNAG with Strap  - 2 x daily - 7 x weekly - 1 sets - 5  reps  Access Code: [email or web address] URL: [email or web address] Date: 10/31/2021 Prepared by: Josue Hector  Exercises - Seated Scapular Retraction  - 3 x daily - 7 x weekly - 2 sets - 10 reps - 3 second hold - Seated Cervical Retraction  - 3 x daily - 7 x weekly - 2 sets - 10 reps - 3 second hold - Seated Upper Trapezius Stretch  - 3 x daily - 7 x weekly - 1 sets - 3 reps - 30 second hold  ASSESSMENT:  CLINICAL IMPRESSION: Patient has made good progress and has currently met all but 1 therapy goal. Patient reports significant subjective improvement and shows good cervical ROM. Patient remains limited by ongoing neck and back pain. At this time patient being DC for neck pain with all functional goals met, and is to follow up regarding MD referral for lumbar spine in the near future. Answered all patient questions regarding HEP and encouraged patient to follow up with therapy services with any further questions or concerns.    OBJECTIVE IMPAIRMENTS decreased activity tolerance, decreased mobility, decreased ROM, decreased strength, hypomobility, increased fascial restrictions, increased muscle spasms, impaired flexibility, impaired UE functional use, improper body mechanics, postural dysfunction, and pain.   ACTIVITY LIMITATIONS carrying, lifting, sleeping, bathing, dressing, reach over head, hygiene/grooming, and locomotion level  PARTICIPATION LIMITATIONS: cleaning, laundry, driving, shopping, community activity, and yard work  PERSONAL FACTORS  Chronic pain Hx  are also affecting patient's functional outcome.   REHAB POTENTIAL: Good  CLINICAL DECISION MAKING: Stable/uncomplicated  EVALUATION COMPLEXITY: Low   GOALS: SHORT TERM GOALS: Target date: 11/21/2021  Patient will be independent with initial HEP and self-management strategies to improve functional outcomes Baseline: 7/13:  Reports compliance with HEP daily. Goal status: Met   LONG TERM GOALS:  Target date:  12/12/2021  Patient will be independent with advanced HEP and self-management strategies to improve functional outcomes Baseline: Reviewed HEP and answered all questions  Goal status: MET  2.  Patient will improve FOTO score to predicted value to indicate improvement in functional outcomes (FOTO predicated value of 46) Baseline: 65% function  Goal status: MET  3.  Patient will demo improved bilateral cervical rotation by 10 degrees in order to improve ability to scan environment for safety and while driving. Baseline: See AROM  Goal status: MET  4. Patient will report a decrease in neck pain to no more than 3/10 for improved quality of life and ability to perform UE ADLs  Baseline: 4/10  Goal status: Not MET   PLAN: PT FREQUENCY: 2x/week  PT DURATION: 6 weeks  PLANNED INTERVENTIONS: Therapeutic exercises, Therapeutic activity, Neuromuscular re-education, Balance training, Gait training, Patient/Family education, Joint manipulation, Joint mobilization, Stair training, Aquatic Therapy, Dry Needling, Electrical stimulation, Spinal manipulation, Spinal mobilization, Cryotherapy, Moist heat, scar mobilization, Taping, Traction, Ultrasound, Biofeedback, Ionotophoresis 22m/ml Dexamethasone, and Manual therapy.   PLAN FOR NEXT SESSION:  DC neck, awaiting referral for lumbar  4:38 PM, 01/07/22 CJosue HectorPT DPT  Physical Therapist with [facility] [phone]

## 2022-01-09 ENCOUNTER — Ambulatory Visit: Payer: Medicare Other | Admitting: Physician Assistant

## 2022-01-13 ENCOUNTER — Ambulatory Visit (INDEPENDENT_AMBULATORY_CARE_PROVIDER_SITE_OTHER): Payer: Medicare Other | Admitting: Family Medicine

## 2022-01-13 ENCOUNTER — Encounter: Payer: Self-pay | Admitting: Family Medicine

## 2022-01-13 VITALS — BP 137/96 | HR 115 | Temp 96.6°F | Ht 62.0 in | Wt 187.2 lb

## 2022-01-13 DIAGNOSIS — R350 Frequency of micturition: Secondary | ICD-10-CM | POA: Diagnosis not present

## 2022-01-13 DIAGNOSIS — B3741 Candidal cystitis and urethritis: Secondary | ICD-10-CM

## 2022-01-13 DIAGNOSIS — I2510 Atherosclerotic heart disease of native coronary artery without angina pectoris: Secondary | ICD-10-CM | POA: Diagnosis not present

## 2022-01-13 DIAGNOSIS — Z794 Long term (current) use of insulin: Secondary | ICD-10-CM | POA: Diagnosis not present

## 2022-01-13 DIAGNOSIS — R110 Nausea: Secondary | ICD-10-CM | POA: Diagnosis not present

## 2022-01-13 DIAGNOSIS — R300 Dysuria: Secondary | ICD-10-CM

## 2022-01-13 DIAGNOSIS — E11649 Type 2 diabetes mellitus with hypoglycemia without coma: Secondary | ICD-10-CM | POA: Diagnosis not present

## 2022-01-13 LAB — URINALYSIS, COMPLETE
Bilirubin, UA: NEGATIVE
Glucose, UA: NEGATIVE
Ketones, UA: NEGATIVE
Leukocytes,UA: NEGATIVE
Nitrite, UA: NEGATIVE
Protein,UA: NEGATIVE
RBC, UA: NEGATIVE
Specific Gravity, UA: 1.01 (ref 1.005–1.030)
Urobilinogen, Ur: 0.2 mg/dL (ref 0.2–1.0)
pH, UA: 6.5 (ref 5.0–7.5)

## 2022-01-13 LAB — MICROSCOPIC EXAMINATION
Bacteria, UA: NONE SEEN
Renal Epithel, UA: NONE SEEN /hpf

## 2022-01-13 MED ORDER — PROMETHAZINE HCL 50 MG/ML IJ SOLN: 25.0000 mg | Freq: Once | INTRAMUSCULAR | Status: DC

## 2022-01-13 MED ORDER — FLUCONAZOLE 100 MG PO TABS: ORAL_TABLET | ORAL | 0 refills | Status: DC

## 2022-01-13 MED ORDER — PROMETHAZINE HCL 25 MG/ML IJ SOLN
25.0000 mg | Freq: Once | INTRAMUSCULAR | Status: AC
  Administered 2022-01-13: 25 mg via INTRAMUSCULAR

## 2022-01-13 MED ORDER — ONDANSETRON 8 MG PO TBDP: 8.0000 mg | ORAL_TABLET | Freq: Four times a day (QID) | ORAL | 1 refills | Status: DC | PRN

## 2022-01-13 NOTE — Addendum Note (Signed)
Addended by: Baldomero Lamy B on: 01/13/2022 03:35 PM   Modules accepted: Orders

## 2022-01-13 NOTE — Progress Notes (Signed)
Chief Complaint  Patient presents with   Dysuria    HPI  Patient presents today for urgency and frequency for several days. No burning with urination. Denies fever . No flank pain. Bad  nausea, No vomiting. No appetite. Feels clammy. Having itching of vulva  PMH: Smoking status noted ROS: Per HPI  Objective: BP (!) 137/96   Pulse (!) 115   Temp (!) 96.6 F (35.9 C)   Ht '5\' 2"'$  (1.575 m)   Wt 187 lb 3.2 oz (84.9 kg)   SpO2 94%   BMI 34.24 kg/m  Gen: NAD, alert, cooperative with exam HEENT: NCAT, Abd: tender at each flank forpalpation Ext: No edema, warm Neuro: Alert and oriented, No gross deficits  Assessment and plan:  1. Candida cystitis   2. Type 2 diabetes mellitus with hypoglycemia without coma, with long-term current use of insulin ([place])   3. Dysuria   4. Nausea   5. Urinary frequency     Meds ordered this encounter  Medications   ondansetron (ZOFRAN-ODT) 8 MG disintegrating tablet    Sig: Take 1 tablet (8 mg total) by mouth every 6 (six) hours as needed for nausea or vomiting.    Dispense:  20 tablet    Refill:  1   fluconazole (DIFLUCAN) 100 MG tablet    Sig: Take two with first dose. Then starting the next day take one daily until all are taken.    Dispense:  15 tablet    Refill:  0   promethazine (PHENERGAN) injection 25 mg    Orders Placed This Encounter  Procedures   Urine Culture   Urinalysis, Complete   Microalbumin / creatinine urine ratio    Follow up as needed.  [name], MD

## 2022-01-14 ENCOUNTER — Telehealth: Payer: Self-pay | Admitting: Family Medicine

## 2022-01-14 ENCOUNTER — Other Ambulatory Visit: Payer: Self-pay | Admitting: Family Medicine

## 2022-01-14 ENCOUNTER — Other Ambulatory Visit: Payer: Self-pay | Admitting: *Deleted

## 2022-01-14 LAB — MICROALBUMIN / CREATININE URINE RATIO
Creatinine, Urine: 17 mg/dL
Microalb/Creat Ratio: 444 mg/g creat — ABNORMAL HIGH (ref 0–29)
Microalbumin, Urine: 75.5 ug/mL

## 2022-01-14 LAB — URINE CULTURE: Organism ID, Bacteria: NO GROWTH

## 2022-01-14 MED ORDER — LISINOPRIL 10 MG PO TABS: 10.0000 mg | ORAL_TABLET | Freq: Every day | ORAL | 3 refills | Status: DC

## 2022-01-14 MED ORDER — APIXABAN 5 MG PO TABS: 5.0000 mg | ORAL_TABLET | Freq: Two times a day (BID) | ORAL | 0 refills | Status: DC

## 2022-01-14 NOTE — Telephone Encounter (Signed)
done

## 2022-01-16 ENCOUNTER — Telehealth: Payer: Self-pay | Admitting: Family Medicine

## 2022-01-16 NOTE — Telephone Encounter (Signed)
PATIENT AWARE

## 2022-01-21 ENCOUNTER — Ambulatory Visit (INDEPENDENT_AMBULATORY_CARE_PROVIDER_SITE_OTHER): Payer: Medicare Other | Admitting: Family Medicine

## 2022-01-21 ENCOUNTER — Encounter: Payer: Self-pay | Admitting: Family Medicine

## 2022-01-21 VITALS — BP 138/83 | HR 63 | Temp 97.4°F | Ht 62.0 in | Wt 188.6 lb

## 2022-01-21 DIAGNOSIS — R809 Proteinuria, unspecified: Secondary | ICD-10-CM

## 2022-01-21 DIAGNOSIS — M79671 Pain in right foot: Secondary | ICD-10-CM | POA: Diagnosis not present

## 2022-01-21 DIAGNOSIS — M722 Plantar fascial fibromatosis: Secondary | ICD-10-CM | POA: Diagnosis not present

## 2022-01-21 DIAGNOSIS — I2510 Atherosclerotic heart disease of native coronary artery without angina pectoris: Secondary | ICD-10-CM

## 2022-01-21 HISTORY — DX: Proteinuria, unspecified: R80.9

## 2022-01-21 NOTE — Progress Notes (Signed)
Chief Complaint  Patient presents with   Results    HPI  Patient presents today for concern for the microalbuminuria noted on recent blood work. Doesn't understand why it is treated by a blood pressure medication.  Lisinopril had been prescribed.   PMH: Smoking status noted ROS: Per HPI  Objective: BP 138/83   Pulse 63   Temp (!) 97.4 F (36.3 C)   Ht '5\' 2"'$  (1.575 m)   Wt 188 lb 9.6 oz (85.5 kg)   SpO2 95%   BMI 34.50 kg/m  Gen: NAD, alert,   Assessment and plan:  1. Microalbuminuria     17 minute discussion of microalbuminuria, detection, significance for renal dx and treatment.   Follow up as needed.  [name], MD

## 2022-01-27 ENCOUNTER — Telehealth: Payer: Self-pay

## 2022-01-27 ENCOUNTER — Ambulatory Visit (INDEPENDENT_AMBULATORY_CARE_PROVIDER_SITE_OTHER): Payer: Medicare Other | Admitting: Family Medicine

## 2022-01-27 ENCOUNTER — Other Ambulatory Visit: Payer: Self-pay | Admitting: *Deleted

## 2022-01-27 DIAGNOSIS — K921 Melena: Secondary | ICD-10-CM | POA: Diagnosis not present

## 2022-01-27 MED ORDER — HYDROMORPHONE HCL 2 MG PO TABS: 2.0000 mg | ORAL_TABLET | Freq: Four times a day (QID) | ORAL | 0 refills | Status: DC | PRN

## 2022-01-27 MED ORDER — POTASSIUM CHLORIDE CRYS ER 20 MEQ PO TBCR: 20.0000 meq | EXTENDED_RELEASE_TABLET | Freq: Every day | ORAL | 5 refills | Status: DC

## 2022-01-27 NOTE — Telephone Encounter (Signed)
Patient requesting refill on Dilaudid '2mg'$  last fill per pmp was 12/27/21 Filled  Written  ID  Drug  QTY  Days  Prescriber  RX #  Dispenser  Refill  Daily Dose*  Pymt Type  PMP  01/09/2022 12/11/2021 2  Lorazepam 1 Mg Tablet 90.00 30 Ge Plo [id number] [facility] (0327) 0/2 3.00 LME Comm Ins [place] 12/27/2021 12/27/2021 2  Hydromorphone 2 Mg Tablet 120.00 30 Me Lov [id number] [facility] (0327) 0/0 40.00 MME Comm Ins 

## 2022-01-27 NOTE — Progress Notes (Signed)
Telephone visit  Subjective: JS:HFWYO in stool PCP: [name], MD VZC:HYIFOY D Posner is a 76 y.o. female calls for telephone consult today. Patient provides verbal consent for consult held via phone.  Due to COVID-19 pandemic this visit was conducted virtually. This visit type was conducted due to national recommendations for restrictions regarding the COVID-19 Pandemic (e.g. social distancing, sheltering in place) in an effort to limit this patient's exposure and mitigate transmission in our community. All issues noted in this document were discussed and addressed.  A physical exam was not performed with this format.   Location of patient: home Location of provider: [facility] Others present for call: none  1. Hematochezia She reports she developed severe constipation after being started on new meds a few weeks ago, lisinopril/ fluconazole. She reports that she was moving chunks of blood and liquid blood. That has stopped now since getting her bowels to move again normally.  She notes that she is feeling fine now.  Denies any dizziness, shortness of breath, fatigue, abdominal pain, nausea, vomiting.  She has not yet reached out to her gastroenterologist but she was considering this  ROS: Per HPI  Allergies  Allergen Reactions   Ivp Dye [Iodinated Contrast Media] Anaphylaxis and Swelling    Throat closes    Codeine Other (See Comments)   Iodine Other (See Comments)   Latex     Latex tape pulls skin with it   Past Medical History:  Diagnosis Date   Acute exacerbation of CHF (congestive heart failure) ([place]) 12/13/2020   Acute on chronic diastolic CHF (congestive heart failure)/HFpEF Exacerbation 12/16/2020   Acute on chronic respiratory failure with hypoxia ([place]) 01/22/2019   Acute respiratory failure with hypoxia ([place]) 12/16/2020   Asthma    Atrial fibrillation with RVR 05/19/2017   Bipolar I disorder 01/23/2015   CAD (coronary artery disease)    Nonobstructive; Managed by Dr.  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 Wallace Keller   RLS (restless legs syndrome) 04/27/2015    Current Outpatient Medications:    albuterol (PROVENTIL) (2.5 MG/3ML) 0.083% nebulizer solution, Take 3 mLs (2.5 mg total) by nebulization every 6 (six) hours as needed for wheezing or shortness of breath., Disp: 150 mL, Rfl: 0   albuterol (VENTOLIN HFA) 108 (90 Base) MCG/ACT inhaler, Inhale 2 puffs into the lungs every 6 (six) hours as needed for wheezing or shortness of breath., Disp: 1 each, Rfl: 0   alendronate (FOSAMAX) 70 MG tablet, Take 1 tablet (70 mg total) by mouth every 7 (seven) days. Take with a full glass of water on an empty stomach. Do not lay down for at least 2 hours, Disp: 4 tablet, Rfl: 11   apixaban (ELIQUIS)  5 MG TABS tablet, Take 1 tablet (5 mg total) by mouth 2 (two) times daily., Disp: 60 tablet, Rfl: 0   ARIPiprazole (ABILIFY) 2 MG tablet, Take 2 mg by mouth at bedtime., Disp: , Rfl:    atorvastatin (LIPITOR) 40 MG tablet, TAKE 1 TABLET DAILY, Disp: 90 tablet, Rfl: 0   B-D UF III MINI PEN NEEDLES 31G X 5 MM MISC, Inject into the skin., Disp: , Rfl:    benztropine (COGENTIN) 1 MG tablet, Take 1 tablet (1 mg total) by mouth 2 (two) times daily.,  Disp: 60 tablet, Rfl: 2   carbamazepine (CARBATROL) 100 MG 12 hr capsule, Take by mouth at bedtime., Disp: , Rfl:    Continuous Blood Gluc Sensor (DEXCOM G7 SENSOR) MISC, 1 Device by Does not apply route as directed., Disp: 9 each, Rfl: 3   Continuous Blood Gluc Transmit (DEXCOM G6 TRANSMITTER) MISC, 1 Device by Does not apply route as directed., Disp: 1 each, Rfl: 3   denosumab (PROLIA) 60 MG/ML SOSY injection, Inject 60 mg into the skin every 6 (six) months., Disp: 1 mL, Rfl: 1   doxepin (SINEQUAN) 10 MG capsule, Take 10 mg by mouth at bedtime., Disp: , Rfl:    escitalopram (LEXAPRO) 10 MG tablet, Take 10 mg by mouth daily., Disp: , Rfl:    fluconazole (DIFLUCAN) 100 MG tablet, Take two with first dose. Then starting the next day take one daily until all are taken., Disp: 15 tablet, Rfl: 0   Fluticasone-Salmeterol (ADVAIR DISKUS) 500-50 MCG/DOSE AEPB, Inhale 1 puff into the lungs in the morning and at bedtime., Disp: 60 each, Rfl: 0   furosemide (LASIX) 40 MG tablet, Take 1 tablet (40 mg total) by mouth every morning. TAKE 1 TABLET EVERY MORNING, Disp: 30 tablet, Rfl: 3   Galcanezumab-gnlm (EMGALITY) 120 MG/ML SOAJ, INJECT INTO THE SKIN ONCE MONTHLY, Disp: 1 mL, Rfl: 1   HYDROmorphone (DILAUDID) 2 MG tablet, Take 1 tablet (2 mg total) by mouth every 6 (six) hours as needed for severe pain., Disp: 120 tablet, Rfl: 0   icosapent Ethyl (VASCEPA) 1 g capsule, TAKE 2 CAPSULES TWICE A DAY WITH MEALS, Disp: 360 capsule, Rfl: 1   insulin glargine (LANTUS SOLOSTAR) 100 UNIT/ML Solostar Pen, Inject 38 Units into the skin 2 (two) times daily., Disp: 45 mL, Rfl: 6   insulin lispro (HUMALOG KWIKPEN) 100 UNIT/ML KwikPen, Max Daily 50 units, Disp: 45 mL, Rfl: 3   Insulin Pen Needle (PEN NEEDLES) 30G X 5 MM MISC, Use to inject insulin 5 times daily, Disp: 100 each, Rfl: 2   lisinopril (ZESTRIL) 10 MG tablet, Take 1 tablet (10 mg total) by mouth daily., Disp: 90 tablet, Rfl: 3   LORazepam (ATIVAN) 1 MG tablet,  Take 1 tablet (1 mg total) by mouth 2 (two) times daily. And give 2 tablets by mouth at bedtime, Disp: 90 tablet, Rfl: 0   meclizine (ANTIVERT) 25 MG tablet, Take 1 tablet (25 mg total) by mouth 3 (three) times daily as needed for dizziness., Disp: 30 tablet, Rfl: 0   metFORMIN (GLUCOPHAGE) 500 MG tablet, Take 1 tablet (500 mg total) by mouth 2 (two) times daily with a meal. TAKE 1 TABLET(500 MG) BY MOUTH TWICE DAILY AFTER A MEAL, Disp: 180 tablet, Rfl: 3   metoprolol succinate (TOPROL XL) 25 MG 24 hr tablet, Take 1 tablet (25 mg total) by mouth in the morning and at bedtime., Disp: 180 tablet, Rfl: 3   metoprolol succinate (TOPROL-XL) 100 MG 24 hr  tablet, TAKE 1 TABLET IN THE MORNING AND AT BEDTIME, Disp: 180 tablet, Rfl: 3   naloxone (NARCAN) nasal spray 4 mg/0.1 mL, SMARTSIG:Both Nares, Disp: , Rfl:    NON FORMULARY, Diet: _____ Regular, ___  __ NAS, ___x____Consistent Carbohydrate, _______NPO _____Other, Disp: , Rfl:    nystatin ointment (MYCOSTATIN), Apply 1 Application topically 2 (two) times daily., Disp: 30 g, Rfl: 3   nystatin powder, Apply 1 Application topically 3 (three) times daily., Disp: 15 g, Rfl: 0   ondansetron (ZOFRAN-ODT) 8 MG disintegrating tablet, Take 1 tablet (8 mg total) by mouth every 6 (six) hours as needed for nausea or vomiting., Disp: 20 tablet, Rfl: 1   polyethylene glycol (MIRALAX / GLYCOLAX) 17 g packet, Take 17 g by mouth daily., Disp: 14 each, Rfl: 0   potassium chloride SA (KLOR-CON M) 20 MEQ tablet, Take 1 tablet (20 mEq total) by mouth daily., Disp: 30 tablet, Rfl: 5   pregabalin (LYRICA) 200 MG capsule, TAKE 1 CAPSULE(200 MG) BY MOUTH TWICE DAILY, Disp: 60 capsule, Rfl: 5   Semaglutide (RYBELSUS) 14 MG TABS, Take 1 tablet (14 mg total) by mouth daily., Disp: 90 tablet, Rfl: 3   Vitamin D, Cholecalciferol, 25 MCG (1000 UT) TABS, Take 1 tablet by mouth daily in the afternoon., Disp: , Rfl:  No current facility-administered medications for this  visit.  Facility-Administered Medications Ordered in Other Visits:    bupivacaine-epinephrine (MARCAINE W/ EPI) 0.5% -1:200000 injection, , , Anesthesia Intra-op, Effie Berkshire, MD, 12 mL at 01/21/19 0935  Assessment/ Plan: 76 y.o. female   Hematochezia - Plan: Ambulatory referral to Gastroenterology  Uncertain etiology.  Most certainly could be a bleeding hemorrhoid given its correlation with constipation but the constipation was also a marked and abrupt change in her bowel habits.  For this reason referral back to her gastroenterologist, Dr. Percell Miller, has been placed for further evaluation to rule out any concerning features.  We discussed red flag signs and symptoms warranting further evaluation.  Both she and her husband voiced good understanding  Start time: 1:43pm End time: 1:49pm  Total time spent on patient care (including telephone call/ virtual visit): 6 minutes  [place], [address] [phone]

## 2022-01-31 ENCOUNTER — Telehealth: Payer: Self-pay | Admitting: Family Medicine

## 2022-01-31 ENCOUNTER — Ambulatory Visit: Payer: Medicare Other | Admitting: Family Medicine

## 2022-01-31 ENCOUNTER — Other Ambulatory Visit: Payer: Self-pay | Admitting: *Deleted

## 2022-01-31 ENCOUNTER — Ambulatory Visit (INDEPENDENT_AMBULATORY_CARE_PROVIDER_SITE_OTHER): Payer: Medicare Other | Admitting: Family

## 2022-01-31 ENCOUNTER — Encounter: Payer: Self-pay | Admitting: Family

## 2022-01-31 VITALS — BP 131/96 | HR 94 | Temp 97.4°F | Ht 62.0 in | Wt 191.0 lb

## 2022-01-31 DIAGNOSIS — E0865 Diabetes mellitus due to underlying condition with hyperglycemia: Secondary | ICD-10-CM | POA: Diagnosis not present

## 2022-01-31 DIAGNOSIS — R6883 Chills (without fever): Secondary | ICD-10-CM

## 2022-01-31 DIAGNOSIS — I2510 Atherosclerotic heart disease of native coronary artery without angina pectoris: Secondary | ICD-10-CM

## 2022-01-31 DIAGNOSIS — Z20822 Contact with and (suspected) exposure to covid-19: Secondary | ICD-10-CM

## 2022-01-31 DIAGNOSIS — I5032 Chronic diastolic (congestive) heart failure: Secondary | ICD-10-CM

## 2022-01-31 DIAGNOSIS — K146 Glossodynia: Secondary | ICD-10-CM | POA: Diagnosis not present

## 2022-01-31 DIAGNOSIS — I482 Chronic atrial fibrillation, unspecified: Secondary | ICD-10-CM | POA: Diagnosis not present

## 2022-01-31 DIAGNOSIS — Z794 Long term (current) use of insulin: Secondary | ICD-10-CM

## 2022-01-31 LAB — VERITOR FLU A/B WAIVED
Influenza A: NEGATIVE
Influenza B: NEGATIVE

## 2022-01-31 MED ORDER — MOLNUPIRAVIR EUA 200MG CAPSULE: 4.0000 | ORAL_CAPSULE | Freq: Two times a day (BID) | ORAL | 0 refills | Status: AC

## 2022-01-31 MED ORDER — LIDOCAINE VISCOUS HCL 2 % MT SOLN: 15.0000 mL | OROMUCOSAL | 0 refills | Status: DC | PRN

## 2022-01-31 NOTE — Telephone Encounter (Signed)
Discussed with patient.   Amber Dun, FNP

## 2022-01-31 NOTE — Progress Notes (Addendum)
Subjective:    Patient ID: Amber Stephenson, female    DOB: 23-Jan-1946, 76 y.o.   MRN: [id number]  Chief Complaint  Patient presents with   Oral Pain    Injury in tongue    Nausea   Chills   Fatigue   PT presents to the office today with tongue pain that started two days ago. States she bite her tongue two days ago and since has had tenderness.   She has hx of CHF, A Fib, HTN, and DM.  Oral Pain  This is a new problem. The current episode started in the past 7 days.  Sore Throat  This is a new problem. The current episode started in the past 7 days. The problem has been waxing and waning. There has been no fever. The pain is at a severity of 5/10. The pain is mild. Associated symptoms include congestion, headaches and trouble swallowing. Pertinent negatives include no coughing, ear pain, shortness of breath or swollen glands. She has had no exposure to strep. She has tried nothing for the symptoms. The treatment provided mild relief.      Review of Systems  HENT:  Positive for congestion and trouble swallowing. Negative for ear pain.   Respiratory:  Negative for cough and shortness of breath.   Neurological:  Positive for headaches.  All other systems reviewed and are negative.      Objective:   Physical Exam Vitals reviewed.  Constitutional:      General: She is not in acute distress.    Appearance: She is well-developed. She is obese.     Comments: Chills   HENT:     Head: Normocephalic and atraumatic.  Eyes:     Pupils: Pupils are equal, round, and reactive to light.  Neck:     Thyroid: No thyromegaly.  Cardiovascular:     Rate and Rhythm: Normal rate and regular rhythm.     Heart sounds: Normal heart sounds. No murmur heard. Pulmonary:     Effort: Pulmonary effort is normal. No respiratory distress.     Breath sounds: Normal breath sounds. No wheezing.  Abdominal:     General: Bowel sounds are normal. There is no distension.     Palpations: Abdomen is soft.      Tenderness: There is no abdominal tenderness.  Musculoskeletal:        General: No tenderness. Normal range of motion.     Cervical back: Normal range of motion and neck supple.  Skin:    General: Skin is warm and dry.  Neurological:     Mental Status: She is alert and oriented to person, place, and time.     Cranial Nerves: No cranial nerve deficit.     Deep Tendon Reflexes: Reflexes are normal and symmetric.  Psychiatric:        Behavior: Behavior normal.        Thought Content: Thought content normal.        Judgment: Judgment normal.      BP (!) 131/96   Pulse 94   Temp (!) 97.4 F (36.3 C) (Temporal)   Ht '5\' 2"'$  (1.575 m)   Wt 191 lb (86.6 kg)   SpO2 94%   BMI 34.93 kg/m      Assessment & Plan:  Amber Stephenson comes in today with chief complaint of Oral Pain (Injury in tongue ), Nausea, Chills, and Fatigue   Diagnosis and orders addressed:  1. Chills - Novel Coronavirus, NAA ([facility]) -  Veritor Flu A/B Waived  2. Tongue sore Avoid salty and spicy foods Keep clean and dry Lidocaine as needed  - lidocaine (XYLOCAINE) 2 % solution; Use as directed 15 mLs in the mouth or throat every 4 (four) hours as needed for mouth pain.  Dispense: 100 mL; Refill: 0  3. Suspected COVID-19 virus infection - Veritor Flu A/B Waived  4. Chronic atrial fibrillation (HCC)  5. Chronic diastolic heart failure ([place])  6. Diabetes mellitus due to underlying condition with hyperglycemia, with long-term current use of insulin (HCC)   PT will go do home COVID test and call and let me know results. Given chronic conditions she needs antivirals.  Health Maintenance reviewed Diet and exercise encouraged  Follow up plan: Keep follow up with PCP   *Addendum- Pt states she took home COVID test and it was negative. Unsure if she did it correct. States her symptoms have worsen since our visit with increased sore throat, chills, sweating.  Molnupiravir Prescription sent to pharmacy     Evelina Dun, FNP

## 2022-01-31 NOTE — Addendum Note (Signed)
Addended by: Evelina Dun A on: 01/31/2022 03:38 PM   Modules accepted: Orders

## 2022-01-31 NOTE — Patient Instructions (Signed)
Viral Illness, Adult Viruses are tiny germs that can get into a person's body and cause illness. There are many different types of viruses, and they cause many types of illness. Viral illnesses can range from mild to severe. They can affect various parts of the body. Short-term conditions that are caused by a virus include colds and the flu (influenza). Long-term conditions that are caused by a virus include herpes, shingles, and HIV (human immunodeficiency virus) infection. A few viruses have been linked to certain cancers. What are the causes? Many types of viruses can cause illness. Viruses invade cells in your body, multiply, and cause the infected cells to work abnormally or die. When these cells die, they release more of the virus. When this happens, you develop symptoms of the illness, and the virus continues to spread to other cells. If the virus takes over the function of the cell, it can cause the cell to divide and grow out of control. This happens when a virus causes cancer. Different viruses get into the body in different ways. You can get a virus by: Swallowing food or water that has come in contact with the virus (is contaminated). Breathing in droplets that have been coughed or sneezed into the air by an infected person. Touching a surface that has been contaminated with the virus and then touching your eyes, nose, or mouth. Being bitten by an insect or animal that carries the virus. Having sexual contact with a person who is infected with the virus. Being exposed to blood or fluids that contain the virus, either through an open cut or during a transfusion. If a virus enters your body, your body's defense system (immune system) will try to fight the virus. You may be at higher risk for a viral illness if your immune system is weak. What are the signs or symptoms? You may have these symptoms, depending on the type of virus and the location of the cells that it invades: Cold and flu  viruses: Fever. Headache. Sore throat. Muscle aches. Stuffy nose (nasal congestion). Cough. Digestive system (gastrointestinal) viruses: Fever. Pain in the abdomen. Nausea. Diarrhea. Liver viruses (hepatitis): Loss of appetite. Tiredness. Skin or the white parts of your eyes turning yellow (jaundice). Brain and spinal cord viruses: Fever. Headache. Stiff neck. Nausea and vomiting. Confusion or sleepiness. Skin viruses: Warts. Itching. Rash. Sexually transmitted viruses: Discharge. Swelling. Redness. Rash. How is this diagnosed? This condition may be diagnosed based on one or more of the following: Symptoms. Medical history. Physical exam. Blood test, sample of mucus from your lungs (sputum sample), stool sample, or a swab of body fluids or a skin sore (lesion). How is this treated? Viruses can be hard to treat because they live within cells. Antibiotic medicines do not treat viruses because these medicines do not get inside cells. Treatment for a viral illness may include: Resting and drinking plenty of fluids. Medicines to relieve symptoms. These can include over-the-counter medicine for pain and fever, medicines for cough or congestion, and medicines to relieve diarrhea. Antiviral medicines. These medicines are available only for certain types of viruses. Some viral illnesses can be prevented with vaccinations. A common example is the flu shot. Follow these instructions at home: Medicines Take over-the-counter and prescription medicines only as told by your health care provider. If you were prescribed an antiviral medicine, take it as told by your health care provider. Do not stop taking the antiviral even if you start to feel better. Be aware of when antibiotics are   needed and when they are not needed. Antibiotics do not treat viruses. You may get an antibiotic if your health care provider thinks that you may have, or are at risk for, a bacterial infection and you  have a viral infection. Do not ask for an antibiotic prescription if you have been diagnosed with a viral illness. Antibiotics will not make your illness go away faster. Frequently taking antibiotics when they are not needed can lead to antibiotic resistance. When this develops, the medicine no longer works against the bacteria that it normally fights. General instructions  Drink enough fluids to keep your urine pale yellow. Rest as much as possible. Return to your normal activities as told by your health care provider. Ask your health care provider what activities are safe for you. Keep all follow-up visits as told by your health care provider. This is important. How is this prevented? To reduce your risk of viral illness: Wash your hands often with soap and water for at least 20 seconds. If soap and water are not available, use hand sanitizer. Avoid touching your nose, eyes, and mouth, especially if you have not washed your hands recently. If anyone in your household has a viral infection, clean all household surfaces that may have been in contact with the virus. Use soap and hot water. You may also use bleach that you have added water to (diluted). Stay away from people who are sick with symptoms of a viral infection. Do not share items such as toothbrushes and water bottles with other people. Keep your vaccinations up to date. This includes getting a yearly flu shot. Eat a healthy diet and get plenty of rest. Contact a health care provider if: You have symptoms of a viral illness that do not go away. Your symptoms come back after going away. Your symptoms get worse. Get help right away if you have: Trouble breathing. A severe headache or a stiff neck. Severe vomiting or pain in your abdomen. These symptoms may represent a serious problem that is an emergency. Do not wait to see if the symptoms will go away. Get medical help right away. Call your local emergency services (911 in the  [place] Do not drive yourself to the hospital. Summary Viruses are types of germs that can get into a person's body and cause illness. Viral illnesses can range from mild to severe. They can affect various parts of the body. Viruses can be hard to treat. There are medicines to relieve symptoms, and there are some antiviral medicines. If you were prescribed an antiviral medicine, take it as told by your health care provider. Do not stop taking the antiviral even if you start to feel better. Contact a health care provider if you have symptoms of a viral illness that do not go away. This information is not intended to replace advice given to you by your health care provider. Make sure you discuss any questions you have with your health care provider. Document Revised: 10/03/2019 Document Reviewed: 03/29/2019 [employer] Patient Education  2023 [employer]  

## 2022-02-01 LAB — NOVEL CORONAVIRUS, NAA: SARS-CoV-2, NAA: NOT DETECTED

## 2022-02-05 ENCOUNTER — Encounter: Payer: Self-pay | Admitting: Physical Medicine and Rehabilitation

## 2022-02-05 ENCOUNTER — Encounter
Payer: Medicare Other | Attending: Physical Medicine and Rehabilitation | Admitting: Physical Medicine and Rehabilitation

## 2022-02-05 VITALS — BP 146/99 | HR 77 | Ht 62.0 in | Wt 192.6 lb

## 2022-02-05 DIAGNOSIS — G894 Chronic pain syndrome: Secondary | ICD-10-CM | POA: Diagnosis not present

## 2022-02-05 DIAGNOSIS — M5441 Lumbago with sciatica, right side: Secondary | ICD-10-CM | POA: Insufficient documentation

## 2022-02-05 DIAGNOSIS — G8929 Other chronic pain: Secondary | ICD-10-CM | POA: Insufficient documentation

## 2022-02-05 DIAGNOSIS — Z5181 Encounter for therapeutic drug level monitoring: Secondary | ICD-10-CM | POA: Diagnosis not present

## 2022-02-05 DIAGNOSIS — Z79891 Long term (current) use of opiate analgesic: Secondary | ICD-10-CM | POA: Diagnosis not present

## 2022-02-05 MED ORDER — LIDOCAINE 5 % EX PTCH
2.0000 | MEDICATED_PATCH | CUTANEOUS | 5 refills | Status: DC
Start: 2022-02-05 — End: 2022-02-18

## 2022-02-05 MED ORDER — HYDROMORPHONE HCL 2 MG PO TABS: 2.0000 mg | ORAL_TABLET | Freq: Four times a day (QID) | ORAL | 0 refills | Status: DC | PRN

## 2022-02-05 NOTE — Progress Notes (Signed)
Subjective:    Patient ID: Amber Stephenson, female    DOB: Apr 25, 1946, 76 y.o.   MRN: [id number]  HPI  Patient is a 76 yr old female with hx of  CAD, pulmonary HTN- and DM- with CKD- Stage III here for right low back pain with sciatica  As well as upper back/neck pain/myofascial pain. Pt is here for chronic pain - doesn't want TrP injections today.    No specific issues today.   Pain doing about the same on Dilaudid- maybe a little worse.   Doesn't want Trigger point injections today.  Doesn't think there's a trigger causing more pain.   Been worse in the last week- also hasn't been feeling poorly the last week- thought had COVID, but didn't.   Just got refill of pain meds last week.   Still TTP across low back - mainly the right side.  Pain meds still helping a lot.    Pain Inventory Average Pain 7 Pain Right Now 8 My pain is intermittent and sharp  In the last 24 hours, has pain interfered with the following? General activity 7 Relation with others 0 Enjoyment of life 2 What TIME of day is your pain at its worst? evening Sleep (in general) Poor  Pain is worse with: walking Pain improves with: rest, heat/ice, and medication Relief from Meds: 6  Family History  Problem Relation Age of Onset   Alzheimer's disease Father    Diabetes Mother    Heart disease Mother    Stroke Brother    Heart disease Brother    Mental illness Brother    Diabetes Brother    Heart disease Sister        CABG   Diabetes Sister    Alcohol abuse Sister    Social History   Socioeconomic History   Marital status: Married    Spouse name: Orpah Greek   Number of children: 3   Years of education: 15   Highest education level: Bachelor's degree (e.g., BA, AB, BS)  Occupational History   Occupation: Retired    Comment: marketing  Tobacco Use   Smoking status: Never   Smokeless tobacco: Never  Vaping Use   Vaping Use: Never used  Substance and Sexual Activity   Alcohol use: Not  Currently    Alcohol/week: 0.0 standard drinks of alcohol   Drug use: No   Sexual activity: Yes    Partners: Male    Birth control/protection: Post-menopausal  Other Topics Concern   Not on file  Social History Narrative   Lives at home with husband.    They have 3 children who live away - [place], [place], and [place].   She is from [place] and most of her family lives there.   Her husbands's family live nearby   Right handed.   Social Determinants of Health   Financial Resource Strain: Low Risk  (12/13/2021)   Overall Financial Resource Strain (CARDIA)    Difficulty of Paying Living Expenses: Not hard at all  Food Insecurity: No Food Insecurity (12/13/2021)   Hunger Vital Sign    Worried About Running Out of Food in the Last Year: Never true    Ran Out of Food in the Last Year: Never true  Transportation Needs: No Transportation Needs (12/13/2021)   PRAPARE - Hydrologist (Medical): No    Lack of Transportation (Non-Medical): No  Physical Activity: Inactive (12/13/2021)   Exercise Vital Sign    Days of Exercise  per Week: 0 days    Minutes of Exercise per Session: 0 min  Stress: No Stress Concern Present (12/13/2021)   [place]    Feeling of Stress : Not at all  Social Connections: [place] (12/13/2021)   Social Connection and Isolation Panel [NHANES]    Frequency of Communication with Friends and Family: More than three times a week    Frequency of Social Gatherings with Friends and Family: Twice a week    Attends Religious Services: More than 4 times per year    Active Member of [employer] or Organizations: Yes    Attends Music therapist: More than 4 times per year    Marital Status: Married   Past Surgical History:  Procedure Laterality Date   BREAST REDUCTION SURGERY     EYE SURGERY Right    cateracts   HAMMER TOE SURGERY     LEFT HEART CATH AND  CORONARY ANGIOGRAPHY N/A 02/03/2018   Procedure: LEFT HEART CATH AND CORONARY ANGIOGRAPHY;  Surgeon: Martinique, Peter M, MD;  Location: [facility];  Service: Cardiovascular;  Laterality: N/A;   REVERSE SHOULDER ARTHROPLASTY Right 08/19/2019   Procedure: REVERSE SHOULDER ARTHROPLASTY;  Surgeon: Netta Cedars, MD;  Location: [facility];  Service: Orthopedics;  Laterality: Right;  interscalene block   SHOULDER SURGERY Right    "I BROKE MY SHOUDLER   THIGH SURGERY     "TO REMOVE A TUMOR "   Past Surgical History:  Procedure Laterality Date   BREAST REDUCTION SURGERY     EYE SURGERY Right    cateracts   HAMMER TOE SURGERY     LEFT HEART CATH AND CORONARY ANGIOGRAPHY N/A 02/03/2018   Procedure: LEFT HEART CATH AND CORONARY ANGIOGRAPHY;  Surgeon: Martinique, Peter M, MD;  Location: [facility];  Service: Cardiovascular;  Laterality: N/A;   REVERSE SHOULDER ARTHROPLASTY Right 08/19/2019   Procedure: REVERSE SHOULDER ARTHROPLASTY;  Surgeon: Netta Cedars, MD;  Location: [facility];  Service: Orthopedics;  Laterality: Right;  interscalene block   SHOULDER SURGERY Right    "I BROKE MY SHOUDLER   THIGH SURGERY     "TO REMOVE A TUMOR "   Past Medical History:  Diagnosis Date   Acute exacerbation of CHF (congestive heart failure) ([place]) 12/13/2020   Acute on chronic diastolic CHF (congestive heart failure)/HFpEF Exacerbation 12/16/2020   Acute on chronic respiratory failure with hypoxia ([place]) 01/22/2019   Acute respiratory failure with hypoxia ([place]) 12/16/2020   Asthma    Atrial fibrillation with RVR 05/19/2017   Bipolar I disorder 01/23/2015   CAD (coronary artery disease)    Nonobstructive; Managed by Dr. 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 Wallace Keller   RLS (restless legs syndrome) 04/27/2015   BP (!) 146/99   Pulse 77   Ht '5\' 2"'$  (1.575 m)   Wt 192 lb 9.6 oz (87.4 kg)   SpO2 93%   BMI 35.23 kg/m   Opioid Risk Score:   Fall Risk Score:  `1  Depression screen [facility] 2/9     02/05/2022   10:57 AM 01/21/2022    9:46 AM 12/13/2021    1:23 PM 12/11/2021    2:06 PM 12/11/2021    2:02 PM 12/06/2021    9:36 AM 11/26/2021    9:43 AM  Depression screen PHQ 2/9  Decreased Interest 1 0 0 0 0 0 0  Down, Depressed, Hopeless 1 0 0 0 0 0 0  PHQ - 2 Score 2 0 0 0 0 0 0     Review of Systems  Constitutional: Negative.   HENT: Negative.    Eyes: Negative.   Respiratory: Negative.    Cardiovascular: Negative.   Gastrointestinal: Negative.   Endocrine: Negative.   Genitourinary: Negative.   Musculoskeletal:  Positive for arthralgias, back pain, gait problem and myalgias.  Skin: Negative.   Allergic/Immunologic: Negative.   Hematological:  Bruises/bleeds easily.       Eliquis  Psychiatric/Behavioral:  Positive for dysphoric mood.   All other systems reviewed and are negative.      Objective:   Physical Exam  Awake, alert, appropriate, accompanied by husband, NAD TTP across Low back and R low back, which has large palpable trigger point felt.       Assessment & Plan:   Patient is a 76 yr old female with hx of  CAD, pulmonary HTN- and DM- with CKD- Stage III here for right low back pain with sciatica  As well as upper back/neck  pain/myofascial pain. Pt is here for chronic pain - doesn't want TrP injections today.    Will refill Dilaudid 2 mg 4x/day as needed # 120- will send in again today- last refill 01/27/22.   2. Can try Lidocaine patches- 2 patches- 12 hrs on; 12 hrs off- has tried in past with good results- will try 2 patches- to help pain of chronic back pain.   3. Planning a trip to [facility] in December for 4 days- try Lidoderm for increased activity.    4. F/U- in 10month- con't pain meds  5. Needs UDS- due today.

## 2022-02-05 NOTE — Patient Instructions (Signed)
Patient is a 76 yr old female with hx of  CAD, pulmonary HTN- and DM- with CKD- Stage III here for right low back pain with sciatica  As well as upper back/neck pain/myofascial pain. Pt is here for chronic pain - doesn't want TrP injections today.    Will refill Dilaudid 2 mg 4x/day as needed # 120- will send in again today- last refill 01/27/22.   2. Can try Lidocaine patches- 2 patches- 12 hrs on; 12 hrs off- has tried in past with good results- will try 2 patches- to help pain of chronic back pain.   3. Planning a trip to [facility] in December for 4 days- try Lidoderm for increased activity.    4. F/U- in 60month- con't pain meds  5. Needs UDS- due today.

## 2022-02-07 ENCOUNTER — Telehealth: Payer: Self-pay | Admitting: *Deleted

## 2022-02-07 LAB — TOXASSURE SELECT,+ANTIDEPR,UR

## 2022-02-07 NOTE — Telephone Encounter (Signed)
Urine drug screen for this encounter is consistent for prescribed medication 

## 2022-02-10 ENCOUNTER — Ambulatory Visit (HOSPITAL_COMMUNITY): Payer: Medicare Other | Attending: Neurosurgery | Admitting: Physical Therapy

## 2022-02-10 ENCOUNTER — Encounter (HOSPITAL_COMMUNITY): Payer: Self-pay | Admitting: Physical Therapy

## 2022-02-10 DIAGNOSIS — M6281 Muscle weakness (generalized): Secondary | ICD-10-CM | POA: Insufficient documentation

## 2022-02-10 DIAGNOSIS — Z01810 Encounter for preprocedural cardiovascular examination: Secondary | ICD-10-CM | POA: Insufficient documentation

## 2022-02-10 DIAGNOSIS — I129 Hypertensive chronic kidney disease with stage 1 through stage 4 chronic kidney disease, or unspecified chronic kidney disease: Secondary | ICD-10-CM | POA: Diagnosis not present

## 2022-02-10 DIAGNOSIS — I10 Essential (primary) hypertension: Secondary | ICD-10-CM | POA: Insufficient documentation

## 2022-02-10 DIAGNOSIS — I4821 Permanent atrial fibrillation: Secondary | ICD-10-CM | POA: Insufficient documentation

## 2022-02-10 DIAGNOSIS — M545 Low back pain, unspecified: Secondary | ICD-10-CM | POA: Insufficient documentation

## 2022-02-10 DIAGNOSIS — E7800 Pure hypercholesterolemia, unspecified: Secondary | ICD-10-CM | POA: Insufficient documentation

## 2022-02-10 DIAGNOSIS — E6609 Other obesity due to excess calories: Secondary | ICD-10-CM | POA: Diagnosis not present

## 2022-02-10 DIAGNOSIS — R809 Proteinuria, unspecified: Secondary | ICD-10-CM | POA: Diagnosis not present

## 2022-02-10 DIAGNOSIS — I5032 Chronic diastolic (congestive) heart failure: Secondary | ICD-10-CM | POA: Insufficient documentation

## 2022-02-10 DIAGNOSIS — N189 Chronic kidney disease, unspecified: Secondary | ICD-10-CM | POA: Diagnosis not present

## 2022-02-10 DIAGNOSIS — E1122 Type 2 diabetes mellitus with diabetic chronic kidney disease: Secondary | ICD-10-CM | POA: Diagnosis not present

## 2022-02-10 DIAGNOSIS — R262 Difficulty in walking, not elsewhere classified: Secondary | ICD-10-CM | POA: Insufficient documentation

## 2022-02-10 DIAGNOSIS — R2689 Other abnormalities of gait and mobility: Secondary | ICD-10-CM | POA: Diagnosis not present

## 2022-02-10 DIAGNOSIS — E1129 Type 2 diabetes mellitus with other diabetic kidney complication: Secondary | ICD-10-CM | POA: Diagnosis not present

## 2022-02-10 NOTE — Therapy (Signed)
OUTPATIENT PHYSICAL THERAPY THORACOLUMBAR EVALUATION   Patient Name: Amber Stephenson MRN: [id number] DOB:May 15, 1946, 76 y.o., female Today's Date: 02/10/2022   PT End of Session - 02/10/22 1220     Visit Number 1    Number of Visits 12    Date for PT Re-Evaluation 03/24/22    Authorization Type Medicare (secondary Tricare for life)    Progress Note Due on Visit 10    PT Start Time 1217    PT Stop Time 1302    PT Time Calculation (min) 45 min    Activity Tolerance Patient limited by pain    Behavior During Therapy Flat affect             Past Medical History:  Diagnosis Date   Acute exacerbation of CHF (congestive heart failure) ([place]) 12/13/2020   Acute on chronic diastolic CHF (congestive heart failure)/HFpEF Exacerbation 12/16/2020   Acute on chronic respiratory failure with hypoxia ([place]) 01/22/2019   Acute respiratory failure with hypoxia ([place]) 12/16/2020   Asthma    Atrial fibrillation with RVR 05/19/2017   Bipolar I disorder 01/23/2015   CAD (coronary artery disease)    Nonobstructive; Managed by Dr. 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 Wallace Keller   RLS (restless legs syndrome) 04/27/2015   Past Surgical History:  Procedure  Laterality Date   BREAST REDUCTION SURGERY     EYE SURGERY Right    cateracts   HAMMER TOE SURGERY     LEFT HEART CATH AND CORONARY ANGIOGRAPHY N/A 02/03/2018   Procedure: LEFT HEART CATH AND CORONARY ANGIOGRAPHY;  Surgeon: Martinique, Peter M, MD;  Location: [facility];  Service: Cardiovascular;  Laterality: N/A;   REVERSE SHOULDER ARTHROPLASTY Right 08/19/2019   Procedure: REVERSE SHOULDER ARTHROPLASTY;  Surgeon: Netta Cedars, MD;  Location: [facility];  Service: Orthopedics;  Laterality: Right;  interscalene block   SHOULDER SURGERY Right    "I BROKE MY SHOUDLER   THIGH SURGERY     "TO REMOVE A TUMOR "   Patient Active Problem List   Diagnosis Date Noted   Microalbuminuria 01/21/2022   Tremor, essential 12/11/2021   Superficial fungus infection of skin 09/03/2021   Chronic right-sided low back pain with right-sided sciatica 07/19/2021   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   Vaginal itching 04/16/2021   Dysuria 04/16/2021   Sensory ataxia 03/28/2021   Yeast infection 01/14/2021   UTI (urinary tract infection) 12/14/2020   Elevated brain natriuretic peptide (BNP) level 12/14/2020   Elevated troponin 12/14/2020   Chronic post-traumatic stress disorder 12/06/2020   Tremor 12/06/2020   Senile nuclear sclerosis 12/06/2020   Senile corneal changes 12/06/2020   Psychoses ([place]) 12/06/2020   Presbyopia 12/06/2020  Postmenopausal bleeding 12/06/2020   Postcoital bleeding 12/06/2020   Pinguecula 12/06/2020   Pain in joint, shoulder region 12/06/2020   Other psychological or physical stress 12/06/2020   Other acquired hammer toe 12/06/2020   Osteopenia 12/06/2020   Opioid dependence ([place]) 12/06/2020   Non-neoplastic nevus 12/06/2020   Essential hypertriglyceridemia 12/06/2020   Hypermetropia 12/06/2020   Debility 12/06/2020   Cystitis 12/06/2020   Depressed bipolar I disorder in full remission ([place]) 12/06/2020   Benzodiazepine dependence ([place]) 12/06/2020   Benign neoplasm  of skin 12/06/2020   Anxiety 12/06/2020   Functional diarrhea 10/26/2020   Diabetes mellitus due to underlying condition with hyperglycemia, with long-term current use of insulin ([place]) 09/24/2020   Dyslipidemia 09/24/2020   Head trauma 09/17/2020   Chronic respiratory failure with hypoxia ([place]) 04/25/2020   Chronic constipation 04/25/2020   Back pain/sacroiliitis--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   Ambulatory dysfunction--back pain and falls 04/23/2020   Chronic pain syndrome 08/22/2019   Nerve pain 08/22/2019   Myofascial pain dysfunction syndrome 08/22/2019   CAD (coronary artery disease)    Hypocalcemia    S/P shoulder replacement, 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   History of adenomatous polyp of colon 05/21/2019   Polypharmacy 05/21/2019   Benign paroxysmal positional vertigo due to bilateral vestibular disorder 05/20/2019   Mixed hyperlipidemia 05/20/2019   Vertigo 04/25/2019   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   OSA (obstructive sleep apnea) 02/24/2019   Allergic rhinitis 02/24/2019   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   Morbid obesity with alveolar hypoventilation ([place]) 07/06/2018   Obesity (BMI 30-39.9) 07/06/2018   At risk for adverse drug event 07/06/2018   Cardiomegaly 00/93/8182   Non-alcoholic fatty liver disease 01/12/2018   Mild intermittent asthma 01/12/2018   Senile osteoporosis 12/15/2017   Fibromyalgia 03/19/2017   Migraine headache with aura 02/12/2016   Diabetic neuropathy associated with type 2 diabetes mellitus ([place]) 02/06/2016   Bipolar 1 disorder, manic, moderate ([place]) 10/04/2015   Depression, recurrent ([place]) 10/03/2015   Herpes genitalis in women 07/16/2015   Long term current use of anticoagulant 05/30/2015   Family history of coronary arteriosclerosis 05/30/2015   Chronic diastolic heart failure ([place]) 05/30/2015   Dyspnea on exertion    Essential hypertension    Restless legs  04/27/2015   Lipoma 02/08/2015   Insomnia 01/23/2015   Chronic back pain 01/04/2015   Chronic atrial fibrillation ([place]) 01/04/2015    PCP: [name], MD  REFERRING PROVIDER: Kary Kos, MD   REFERRING DIAG: M54.50 lumbago of lumbar region with sciatica   Rationale for Evaluation and Treatment Rehabilitation  THERAPY DIAG:  Low back pain, unspecified back pain laterality, unspecified chronicity, unspecified whether sciatica present - Plan: PT plan of care cert/re-cert  Muscle weakness (generalized) - Plan: PT plan of care cert/re-cert  Difficulty in walking, not elsewhere classified - Plan: PT plan of care cert/re-cert  Other abnormalities of gait and mobility - Plan: PT plan of care cert/re-cert  ONSET DATE: 9937  SUBJECTIVE:  SUBJECTIVE STATEMENT: Patient reports chronic back pain. Fell at work around 1995 from a ladder. [place] again in her kitchen months later and reports she fractured her back. Has had many bouts of therapy in the past. Finds exercise and rehab to be very helpful. Pain started to increase about 10 months ago. She has had injections from Dr. Saintclair Halsted and was referred to PT. The injections improved symptoms by 50%. States she is limited with prolonged sitting and walking. Symptoms are not radiating down her legs.   PAIN:  Are you having pain? Yes: NPRS scale: 8/10 Pain location: back Pain description: sharp Aggravating factors: walking, sitting, bending forward Relieving factors: heat   PRECAUTIONS: None  WEIGHT BEARING RESTRICTIONS No  FALLS:  Has patient fallen in last 6 months? No  LIVING ENVIRONMENT: Lives with: lives with their spouse Lives in: House/apartment Stairs: No steps to enter, lives on main floor of 2 story home Has following equipment at home:  None  OCCUPATION: n/a  PLOF: Independent with household mobility without device, Needs assistance with ADLs, and Needs assistance with homemaking  PATIENT GOALS Reduce pain in back   OBJECTIVE:   DIAGNOSTIC FINDINGS:  IMPRESSION: There is mild 10% decrease in height of upper endplate of body of T1 vertebra suggesting recent or old mild compression. Degenerative changes are noted in the disc spaces at T9-T10 and T10-T11 levels along with mild compression of lower endplate of body of T9 and upper endplate of body of S97 vertebrae. Findings suggest recent or old compression fractures due to trauma or disc degeneration. Please correlate with clinical symptoms, physical examination findings and consider MRI if warranted.   Schmorl's node is seen in the upper endplate of body of W26 vertebra. Paraspinal soft tissues are unremarkable.  IMPRESSION: Decrease in height of upper endplate of body of L2 vertebra has not changed since 04/28/2021 suggesting mild old compression fracture. No recent fracture is seen in the lumbar spine.   Lumbar spondylosis with encroachment of neural foramina at L4-L5 level.  PATIENT SURVEYS:  FOTO 22%  SCREENING FOR RED FLAGS: Bowel or bladder incontinence: No Spinal tumors: Yes - pt reports MD following Cauda equina syndrome: No Compression fracture: Yes: found on imagine, MD aware Abdominal aneurysm: No  COGNITION:  Overall cognitive status: Within functional limits for tasks assessed     SENSATION: WFL   POSTURE: rounded shoulders, forward head, increased thoracic kyphosis, and flexed trunk    LUMBAR ROM:   Active  A/PROM  eval  Flexion  Reaches knees P!  Extension Limited 90% P!  Right lateral flexion Limited 50%  Left lateral flexion Limited 50% P!  Right rotation Limited 25% P!  Left rotation Limited 50% P!   (Blank rows = not tested)    LOWER EXTREMITY MMT:    MMT Right eval Left eval  Hip flexion 4+ 4-  Hip extension  2 2  Hip abduction 4+ 4  Hip adduction 5 5  Hip internal rotation    Hip external rotation    Knee flexion 4+ 4-  Knee extension 5 4+  Ankle dorsiflexion 5 5  Ankle plantarflexion    Ankle inversion    Ankle eversion     (Blank rows = not tested)  LUMBAR SPECIAL TESTS:  Not tolerated during evaluation due to increased pain level  FUNCTIONAL TESTS:  5 times sit to stand: 23.85 sec without hands, back of knees pressing into chair 2 minute walk test: 210 feet no AD supervision for safety  GAIT:  Distance walked: 210 Assistive device utilized: None Level of assistance: SBA Comments: rigid trunk without reciprocal arm swing, reduced step length, limits head rotation. Occasional stagger towards Lt able to self correct.    TODAY'S TREATMENT  Eval MMT, 2MWT, 5x sit to stand HEP Education   PATIENT EDUCATION:  Education details: Findings, POC, PT scope, HEP, symptom awareness, modalities as needed.  Person educated: Patient Education method: Explanation Education comprehension: verbalized understanding   HOME EXERCISE PROGRAM: Access Code: [email or web address] URL: [email or web address] Date: 02/10/2022 Prepared by: Candie Mile  Exercises - Supine Transversus Abdominis Bracing - Hands on Stomach  - 2 x daily - 7 x weekly - 2 sets - 10 reps - Supine Lower Trunk Rotation  - 2 x daily - 7 x weekly - 2 sets - 10 reps - Supine Figure 4 Piriformis Stretch  - 2 x daily - 7 x weekly - 2 sets - 10 second hold  ASSESSMENT:  CLINICAL IMPRESSION: Patient is a 76 y.o. female who was seen today for physical therapy evaluation and treatment for lower back pain. Patient presents with deficits including reduced strength, limited range of motion, decreased endurance, limited activity tolerance, abnormalities of gait, impaired balance, and pain, contributing to impaired functional mobility with ADLs and IADLs. Patient is currently restricted in ADLs as indicated by objective and  subjective functional outcome measures, as well as reported history and objective measures taken during this exam. Patient will benefit from skilled physical therapy intervention in order to improve function and reduce the impairments listed above.    OBJECTIVE IMPAIRMENTS Abnormal gait, decreased activity tolerance, decreased balance, decreased endurance, decreased knowledge of condition, decreased knowledge of use of DME, decreased mobility, difficulty walking, decreased ROM, decreased strength, increased muscle spasms, impaired flexibility, improper body mechanics, postural dysfunction, obesity, and pain.   ACTIVITY LIMITATIONS carrying, lifting, bending, sitting, standing, squatting, stairs, transfers, bathing, dressing, reach over head, and locomotion level  PARTICIPATION LIMITATIONS: meal prep, cleaning, laundry, driving, shopping, and community activity  PERSONAL FACTORS Age, Fitness, and Time since onset of injury/illness/exacerbation are also affecting patient's functional outcome.   REHAB POTENTIAL: Good  CLINICAL DECISION MAKING: Stable/uncomplicated  EVALUATION COMPLEXITY: Low   GOALS: Goals reviewed with patient? Yes  SHORT TERM GOALS: Target date: 03/07/22  Patient will be independent with initial HEP and self-management strategies to improve functional outcomes Baseline: initiated Goal status: INITIAL   LONG TERM GOALS: Target date: 03/24/22  Patient will be independent with advanced HEP and self-management strategies to improve functional outcomes Baseline: n/a  Goal status: INITIAL  2.  Patient will improve FOTO score to predicted value of 41% to indicate improvement in functional outcomes Baseline: 22% Goal status: INITIAL  3.  Patient will report reduction of back pain to <3/10 for improved quality of life and ability to perform ADLs  Baseline: 8/10 Goal status: INITIAL  4. Patient will have equal to or > 4+/5 MMT throughout BIL LEs to improve ability to  perform functional mobility, stair ambulation and ADLs.  Baseline: see above Goal status: INITIAL   5. Patient will be able to ambulate at least 275 feet during 2MWT with LRAD to demonstrate improved ability to perform functional mobility and associated tasks. Baseline: 210' no AD Goal status: INITIAL  PLAN: PT FREQUENCY: 2x/week  PT DURATION: 6 weeks  PLANNED INTERVENTIONS: Therapeutic exercises, Therapeutic activity, Neuromuscular re-education, Balance training, Gait training, Patient/Family education, Self Care, Joint mobilization, Joint manipulation, Stair training, DME instructions, Dry Needling, Electrical stimulation, Spinal manipulation, Spinal mobilization, Cryotherapy, Moist  heat, Taping, Ultrasound, Ionotophoresis '4mg'$ /ml Dexamethasone, Manual therapy, and Re-evaluation.  PLAN FOR NEXT SESSION: Gentle lumbar ROM (Manual as indicated), progressive core strengthening, balance and gait training when able.   Candie Mile, PT, DPT Physical Therapist Acute Rehabilitation Services [facility]  02/10/2022, 1:20 PM

## 2022-02-11 DIAGNOSIS — K625 Hemorrhage of anus and rectum: Secondary | ICD-10-CM | POA: Diagnosis not present

## 2022-02-11 DIAGNOSIS — R109 Unspecified abdominal pain: Secondary | ICD-10-CM | POA: Diagnosis not present

## 2022-02-11 DIAGNOSIS — R1032 Left lower quadrant pain: Secondary | ICD-10-CM | POA: Diagnosis not present

## 2022-02-11 DIAGNOSIS — I700 Atherosclerosis of aorta: Secondary | ICD-10-CM | POA: Diagnosis not present

## 2022-02-11 DIAGNOSIS — N183 Chronic kidney disease, stage 3 unspecified: Secondary | ICD-10-CM | POA: Diagnosis not present

## 2022-02-11 DIAGNOSIS — I509 Heart failure, unspecified: Secondary | ICD-10-CM | POA: Diagnosis not present

## 2022-02-11 DIAGNOSIS — E1122 Type 2 diabetes mellitus with diabetic chronic kidney disease: Secondary | ICD-10-CM | POA: Diagnosis not present

## 2022-02-11 DIAGNOSIS — I10 Essential (primary) hypertension: Secondary | ICD-10-CM | POA: Diagnosis not present

## 2022-02-11 DIAGNOSIS — Z794 Long term (current) use of insulin: Secondary | ICD-10-CM | POA: Diagnosis not present

## 2022-02-13 DIAGNOSIS — N189 Chronic kidney disease, unspecified: Secondary | ICD-10-CM | POA: Diagnosis not present

## 2022-02-13 DIAGNOSIS — E876 Hypokalemia: Secondary | ICD-10-CM | POA: Diagnosis not present

## 2022-02-13 DIAGNOSIS — E1122 Type 2 diabetes mellitus with diabetic chronic kidney disease: Secondary | ICD-10-CM | POA: Diagnosis not present

## 2022-02-13 DIAGNOSIS — R809 Proteinuria, unspecified: Secondary | ICD-10-CM | POA: Diagnosis not present

## 2022-02-13 DIAGNOSIS — Z6837 Body mass index (BMI) 37.0-37.9, adult: Secondary | ICD-10-CM | POA: Diagnosis not present

## 2022-02-13 DIAGNOSIS — E1129 Type 2 diabetes mellitus with other diabetic kidney complication: Secondary | ICD-10-CM | POA: Diagnosis not present

## 2022-02-13 DIAGNOSIS — I129 Hypertensive chronic kidney disease with stage 1 through stage 4 chronic kidney disease, or unspecified chronic kidney disease: Secondary | ICD-10-CM | POA: Diagnosis not present

## 2022-02-14 ENCOUNTER — Ambulatory Visit (HOSPITAL_COMMUNITY)
Admission: RE | Admit: 2022-02-14 | Discharge: 2022-02-14 | Disposition: A | Discharge status: Home / Self Care | Payer: Medicare Other | Source: Ambulatory Visit | Attending: Family Medicine | Admitting: Family Medicine

## 2022-02-14 DIAGNOSIS — Z1231 Encounter for screening mammogram for malignant neoplasm of breast: Secondary | ICD-10-CM | POA: Insufficient documentation

## 2022-02-16 NOTE — Progress Notes (Unsigned)
Office Visit    Patient Name: Amber Stephenson Date of Encounter: 02/18/2022  Primary Care Provider:  [name], MD Primary Cardiologist:  None Primary Electrophysiologist: None  Chief Complaint    KYSA CALAIS is a 76 y.o. female with PMH of HTN, permanent AF (on Eliquis), HFpEF, HLD, DMII, asthma, bipolar disorder, CAD nonobstructive by [facility] 01/2018, stage III CKD, OSA (intolerant to CPAP), who presents today for follow-up of AF and CHF.  Past Medical History    Past Medical History:  Diagnosis Date   Acute exacerbation of CHF (congestive heart failure) ([place]) 12/13/2020   Acute on chronic diastolic CHF (congestive heart failure)/HFpEF Exacerbation 12/16/2020   Acute on chronic respiratory failure with hypoxia ([place]) 01/22/2019   Acute respiratory failure with hypoxia ([place]) 12/16/2020   Asthma    Atrial fibrillation with RVR 05/19/2017   Bipolar I disorder 01/23/2015   CAD (coronary artery disease)    Nonobstructive; Managed by Dr. Bronson Ing   Cardiomegaly 01/12/2018   Chronic anticoagulation 05/30/2015   Chronic atrial fibrillation ([place]) 01/04/2015   Chronic back pain    Lower back   Chronic diastolic CHF (congestive heart failure) 05/30/2015   Diabetes mellitus, type 2, without complication    Diabetic neuropathy 02/06/2016   Dysrhythmia    A-Fib   Essential hypertension    Herpes genitalis in women 07/16/2015   Hyperlipidemia    Hypoxia 02/01/2019   Insomnia 01/23/2015   Lipoma 02/08/2015   Major depressive disorder    Migraine headache with aura 02/12/2016   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   NAFL (nonalcoholic fatty liver) 99/24/2683   OSA (obstructive sleep apnea) 02/24/2019   10/09/2018 - HST  - AHI 40.6    Pulmonary hypertension    Renal insufficiency    Managed by Dr. Wallace Keller   RLS (restless legs syndrome) 04/27/2015   Past Surgical History:  Procedure Laterality Date   BREAST REDUCTION SURGERY     EYE SURGERY Right     cateracts   HAMMER TOE SURGERY     LEFT HEART CATH AND CORONARY ANGIOGRAPHY N/A 02/03/2018   Procedure: LEFT HEART CATH AND CORONARY ANGIOGRAPHY;  Surgeon: Martinique, Peter M, MD;  Location: [facility];  Service: Cardiovascular;  Laterality: N/A;   REVERSE SHOULDER ARTHROPLASTY Right 08/19/2019   Procedure: REVERSE SHOULDER ARTHROPLASTY;  Surgeon: Netta Cedars, MD;  Location: [facility];  Service: Orthopedics;  Laterality: Right;  interscalene block   SHOULDER SURGERY Right    "I BROKE MY SHOUDLER   THIGH SURGERY     "TO REMOVE A TUMOR "    Allergies  Allergies  Allergen Reactions   Ivp Dye [Iodinated Contrast Media] Anaphylaxis and Swelling    Throat closes    Codeine Other (See Comments)   Iodine Other (See Comments)   Latex     Latex tape pulls skin with it    History of Present Illness    CHERITA HEBEL  is a 76 year old female with the above mention past medical history who presents today for follow-up of CHF and atrial fibrillation.  Ms. Sandeen was initially seen by Dr.Hochrein in 2016 for complaint of shortness of breath and recent admission of pneumonia. She is also being managed for permanent atrial fibrillation. She transitioned to Dr. Bronson Ing in 2016 and is currently being by Dr. Harl Bowie. She underwent low risks nuclear stress test 06/2015.  She completed high-resolution CT of the chest that revealed cardiomegaly and coronary atherosclerosis. Patient had LHC  completed 2019 due to exertional dyspnea that demonstrated nonobstructive coronary artery disease with a proximal 40% LAD stenosis in mid LAD 30% and elevated LV EDP.  2D echo was completed 01/22/2019 demonstrated normal LV systolic function, EF 55 to 60%. There was mild aortic regurgitation.   She was last seen by Dr. Radford Pax on 07/2021 for follow-up.  During visit patient was doing well however was recovering from the flu and endorsed dizziness with standing.  2D echo was completed 11/2020 with EF of 65-70%, no RWMA,  moderate LVH, severely dilated LA/RA with trivial MV regurgitation.  Patient's AF was well controlled on examination and was tolerating this new medications with no side effects.   Ms. Mckenney presents today for follow-up with her husband.  Since last being seen in the office patient reports she has been doing good with no new cardiac complaints.  She is tolerating her current medications with no adverse effects.  Blood pressure was well controlled at 124/74 and rate is also well managed at 71 bpm.  She is currently beginning a yoga program for exercise.  She had questions regarding an enlarged heart that she saw on a recent x-ray.  We discussed the pathophysiology of cardiomegaly as it relates to atrial fibrillation and coronary artery disease.  Patient denies chest pain, palpitations, dyspnea, PND, orthopnea, nausea, vomiting, dizziness, syncope, edema, weight gain, or early satiety.   Home Medications    Current Outpatient Medications  Medication Sig Dispense Refill   albuterol (PROVENTIL) (2.5 MG/3ML) 0.083% nebulizer solution Take 3 mLs (2.5 mg total) by nebulization every 6 (six) hours as needed for wheezing or shortness of breath. 150 mL 0   albuterol (VENTOLIN HFA) 108 (90 Base) MCG/ACT inhaler Inhale 2 puffs into the lungs every 6 (six) hours as needed for wheezing or shortness of breath. 1 each 0   alendronate (FOSAMAX) 70 MG tablet Take 1 tablet (70 mg total) by mouth every 7 (seven) days. Take with a full glass of water on an empty stomach. Do not lay down for at least 2 hours 4 tablet 11   apixaban (ELIQUIS) 5 MG TABS tablet Take 1 tablet (5 mg total) by mouth 2 (two) times daily. 60 tablet 0   ARIPiprazole (ABILIFY) 2 MG tablet Take 2 mg by mouth at bedtime.     atorvastatin (LIPITOR) 40 MG tablet TAKE 1 TABLET DAILY 90 tablet 0   B-D UF III MINI PEN NEEDLES 31G X 5 MM MISC Inject into the skin.     benztropine (COGENTIN) 1 MG tablet Take 1 tablet (1 mg total) by mouth 2 (two) times  daily. 60 tablet 2   carbamazepine (CARBATROL) 100 MG 12 hr capsule Take by mouth at bedtime.     Continuous Blood Gluc Transmit (DEXCOM G6 TRANSMITTER) MISC 1 Device by Does not apply route as directed. 1 each 3   denosumab (PROLIA) 60 MG/ML SOSY injection Inject 60 mg into the skin every 6 (six) months. 1 mL 1   doxepin (SINEQUAN) 10 MG capsule Take 10 mg by mouth at bedtime.     escitalopram (LEXAPRO) 10 MG tablet Take 10 mg by mouth daily.     Fluticasone-Salmeterol (ADVAIR DISKUS) 500-50 MCG/DOSE AEPB Inhale 1 puff into the lungs in the morning and at bedtime. 60 each 0   furosemide (LASIX) 40 MG tablet Take 1 tablet (40 mg total) by mouth every morning. TAKE 1 TABLET EVERY MORNING 30 tablet 3   Galcanezumab-gnlm (EMGALITY) 120 MG/ML SOAJ INJECT INTO THE  SKIN ONCE MONTHLY 1 mL 1   HYDROmorphone (DILAUDID) 2 MG tablet Take 1 tablet (2 mg total) by mouth every 6 (six) hours as needed for severe pain. 120 tablet 0   icosapent Ethyl (VASCEPA) 1 g capsule TAKE 2 CAPSULES TWICE A DAY WITH MEALS 360 capsule 1   insulin glargine (LANTUS SOLOSTAR) 100 UNIT/ML Solostar Pen Inject 38 Units into the skin 2 (two) times daily. 45 mL 6   insulin lispro (HUMALOG KWIKPEN) 100 UNIT/ML KwikPen Max Daily 50 units 45 mL 3   Insulin Pen Needle (PEN NEEDLES) 30G X 5 MM MISC Use to inject insulin 5 times daily 100 each 2   lisinopril (ZESTRIL) 10 MG tablet Take 1 tablet (10 mg total) by mouth daily. 90 tablet 3   LORazepam (ATIVAN) 1 MG tablet Take 1 tablet (1 mg total) by mouth 2 (two) times daily. And give 2 tablets by mouth at bedtime 90 tablet 0   metFORMIN (GLUCOPHAGE) 500 MG tablet Take 1 tablet (500 mg total) by mouth 2 (two) times daily with a meal. TAKE 1 TABLET(500 MG) BY MOUTH TWICE DAILY AFTER A MEAL 180 tablet 3   metoprolol succinate (TOPROL XL) 25 MG 24 hr tablet Take 1 tablet (25 mg total) by mouth in the morning and at bedtime. 180 tablet 3   metoprolol succinate (TOPROL-XL) 100 MG 24 hr tablet TAKE  1 TABLET IN THE MORNING AND AT BEDTIME 180 tablet 3   naloxone (NARCAN) nasal spray 4 mg/0.1 mL SMARTSIG:Both Nares     NON FORMULARY Diet: _____ Regular, ___  __ NAS, ___x____Consistent Carbohydrate, _______NPO _____Other     nystatin ointment (MYCOSTATIN) Apply 1 Application topically 2 (two) times daily. 30 g 3   nystatin powder Apply 1 Application topically 3 (three) times daily. 15 g 0   ondansetron (ZOFRAN-ODT) 8 MG disintegrating tablet Take 1 tablet (8 mg total) by mouth every 6 (six) hours as needed for nausea or vomiting. 20 tablet 1   potassium chloride SA (KLOR-CON M) 20 MEQ tablet Take 1 tablet (20 mEq total) by mouth daily. 30 tablet 5   pregabalin (LYRICA) 200 MG capsule TAKE 1 CAPSULE(200 MG) BY MOUTH TWICE DAILY 60 capsule 5   Semaglutide (RYBELSUS) 14 MG TABS Take 1 tablet (14 mg total) by mouth daily. 90 tablet 3   Vitamin D, Cholecalciferol, 25 MCG (1000 UT) TABS Take 1 tablet by mouth daily in the afternoon.     No current facility-administered medications for this visit.   Facility-Administered Medications Ordered in Other Visits  Medication Dose Route Frequency Provider Last Rate Last Admin   bupivacaine-epinephrine (MARCAINE W/ EPI) 0.5% -1:200000 injection    Anesthesia Intra-op Effie Berkshire, MD   12 mL at 01/21/19 0935     Review of Systems  Please see the history of present illness.     All other systems reviewed and are otherwise negative except as noted above.  Physical Exam    Wt Readings from Last 3 Encounters:  02/18/22 195 lb 9.6 oz (88.7 kg)  02/17/22 192 lb 6.4 oz (87.3 kg)  02/05/22 192 lb 9.6 oz (87.4 kg)   VS: Vitals:   02/18/22 1408  BP: 124/74  Pulse: 71  ,Body mass index is 35.78 kg/m.  Constitutional:      Appearance: Healthy appearance. Not in distress.  Neck:     Vascular: JVD normal.  Pulmonary:     Effort: Pulmonary effort is normal.     Breath sounds: No wheezing.  No rales. Diminished in the bases Cardiovascular:      Irregularly irregular normal S1. Normal S2.      Murmurs: There is no murmur.  Edema:    Peripheral edema absent.  Abdominal:     Palpations: Abdomen is soft non tender. There is no hepatomegaly.  Skin:    General: Skin is warm and dry.  Neurological:     General: No focal deficit present.     Mental Status: Alert and oriented to person, place and time.     Cranial Nerves: Cranial nerves are intact.  EKG/LABS/Other Studies Reviewed    ECG personally reviewed by me today -none completed today  Risk Assessment/Calculations:    CHA2DS2-VASc Score = 6   This indicates a 9.7% annual risk of stroke. The patient's score is based upon: CHF History: 1 HTN History: 1 Diabetes History: 1 Stroke History: 0 Vascular Disease History: 1 Age Score: 1 Gender Score: 1    Lab Results  Component Value Date   WBC 10.8 (H) 06/19/2021   HGB 15.6 (H) 06/19/2021   HCT 48.5 (H) 06/19/2021   MCV 86.6 06/19/2021   PLT 233.0 06/19/2021   Lab Results  Component Value Date   CREATININE 1.01 (H) 12/06/2021   BUN 17 12/06/2021   NA 143 12/06/2021   K 3.8 12/06/2021   CL 100 12/06/2021   CO2 24 12/06/2021   Lab Results  Component Value Date   ALT 9 06/19/2021   AST 16 06/19/2021   ALKPHOS 88 06/19/2021   BILITOT 0.4 06/19/2021   Lab Results  Component Value Date   CHOL 161 04/03/2021   HDL 41 04/03/2021   LDLCALC 70 04/03/2021   LDLDIRECT 62 03/17/2016   TRIG 311 (H) 04/03/2021   CHOLHDL 3.9 04/03/2021    Lab Results  Component Value Date   HGBA1C 7.3 (A) 11/22/2021    Assessment & Plan    1.  Chronic HFpEF: -2D echo completed EF of 65-70%, no RWMA, moderate LVH, severely dilated LA/RA with trivial MV regurgitation.  -Patient is euvolemic on examination today -Continue Lasix 40 mg, lisinopril 10 mg, Toprol XL 25 mg in a.m. and 100 mg in p.m. -Low sodium diet, fluid restriction <2L, and daily weights encouraged. Educated to contact our office for weight gain of 2 lbs overnight  or 5 lbs in one week.   2.  Permanent AF: -Patient currently rate control therapy -Patient tolerating her current rate control medication with Toprol-XL as noted above -Continue Eliquis 5 mg twice daily -Patient dose appropriate based on creatinine of 0.67 and an hemoglobin of 15.0  -CHA2DS2-VASc Score = 6 [CHF History: 1, HTN History: 1, Diabetes History: 1, Stroke History: 0, Vascular Disease History: 1, Age Score: 1, Gender Score: 1].  Therefore, the patient's annual risk of stroke is 9.7 %.      3.  Hypertension: -Blood pressure today was well controlled at 124/74 -Continue lisinopril and Toprol XL as noted above  4.  Hyperlipidemia: -Patient's most recent LDL was well controlled at -Continue Lipitor 40 mg   5. Surgical Clearance: -Patient requested clearance for upcoming colonoscopy The patient affirms she has been doing well without any new cardiac symptoms. They are able to achieve 4 METS without cardiac limitations. Therefore, based on ACC/AHA guidelines, the patient would be at acceptable risk for the planned procedure without further cardiovascular testing. The patient was advised that if she develops new symptoms prior to surgery to contact our office to arrange for a follow-up  visit, and she verbalized understanding.   Ms. Desrosiers perioperative risk of a major cardiac event is 6.6% according to the Revised Cardiac Risk Index (RCRI).  Therefore, she is at low risk for perioperative complications.   Her functional capacity is fair at 4.31 METs according to the Duke Activity Status Index (DASI). Recommendations: According to ACC/AHA guidelines, no further cardiovascular testing needed.  The patient may proceed to surgery at acceptable risk.   Antiplatelet and/or Anticoagulation Recommendations: Per pharmacy team once request is officially made     Disposition: Follow-up with None or APP in 6 months    Medication Adjustments/Labs and Tests Ordered: Current medicines are  reviewed at length with the patient today.  Concerns regarding medicines are outlined above.   Signed, Mable Fill, Marissa Nestle, NP 02/18/2022, 4:08 PM [place]

## 2022-02-17 ENCOUNTER — Encounter: Payer: Self-pay | Admitting: Family Medicine

## 2022-02-17 ENCOUNTER — Ambulatory Visit (INDEPENDENT_AMBULATORY_CARE_PROVIDER_SITE_OTHER): Payer: Medicare Other | Admitting: Family Medicine

## 2022-02-17 VITALS — BP 106/75 | HR 70 | Temp 97.8°F | Ht 62.0 in | Wt 192.4 lb

## 2022-02-17 DIAGNOSIS — D1724 Benign lipomatous neoplasm of skin and subcutaneous tissue of left leg: Secondary | ICD-10-CM | POA: Diagnosis not present

## 2022-02-17 DIAGNOSIS — I2510 Atherosclerotic heart disease of native coronary artery without angina pectoris: Secondary | ICD-10-CM | POA: Diagnosis not present

## 2022-02-17 DIAGNOSIS — H6121 Impacted cerumen, right ear: Secondary | ICD-10-CM | POA: Diagnosis not present

## 2022-02-17 NOTE — Progress Notes (Signed)
Chief Complaint  Patient presents with   Tumor    LEFT THIGH    HPI  Patient presents today for recurrent growth on the left thigh. Doesn't want it removed if not necessary. Also itching in the right ear.   PMH: Smoking status noted ROS: Per HPI  Objective: BP 106/75   Pulse 70   Temp 97.8 F (36.6 C)   Ht '5\' 2"'$  (1.575 m)   Wt 192 lb 6.4 oz (87.3 kg)   SpO2 90%   BMI 35.19 kg/m  Gen: NAD, alert, cooperative with exam HEENT: NCAT, EOMI, PERRL. Right EAC occluded with cerumen CV: RRR, good S1/S2, no murmur Ext: No edema, warm. Distal left thigh has 6 cm bulging, rubbery mass. Freely mobile. Neuro: Alert and oriented, No gross deficits  Assessment and plan:  1. Lipoma of left lower extremity   2. Impacted cerumen of right ear     Ear lavage OD performed.  Pt. Counselled to monitor the lipoma. If it continues to enlarge or causes pain it can be removed, but that is not generally, or currently necessary.  No orders of the defined types were placed in this encounter.   Follow up as needed.  [name], MD

## 2022-02-18 ENCOUNTER — Encounter: Payer: Self-pay | Admitting: Nurse Practitioner

## 2022-02-18 ENCOUNTER — Ambulatory Visit (INDEPENDENT_AMBULATORY_CARE_PROVIDER_SITE_OTHER): Payer: Medicare Other | Admitting: Nurse Practitioner

## 2022-02-18 VITALS — BP 124/74 | HR 71 | Ht 62.0 in | Wt 195.6 lb

## 2022-02-18 DIAGNOSIS — I5032 Chronic diastolic (congestive) heart failure: Secondary | ICD-10-CM

## 2022-02-18 DIAGNOSIS — E7800 Pure hypercholesterolemia, unspecified: Secondary | ICD-10-CM

## 2022-02-18 DIAGNOSIS — I10 Essential (primary) hypertension: Secondary | ICD-10-CM

## 2022-02-18 DIAGNOSIS — M6281 Muscle weakness (generalized): Secondary | ICD-10-CM | POA: Diagnosis not present

## 2022-02-18 DIAGNOSIS — Z01810 Encounter for preprocedural cardiovascular examination: Secondary | ICD-10-CM | POA: Diagnosis not present

## 2022-02-18 DIAGNOSIS — R262 Difficulty in walking, not elsewhere classified: Secondary | ICD-10-CM | POA: Diagnosis not present

## 2022-02-18 DIAGNOSIS — R2689 Other abnormalities of gait and mobility: Secondary | ICD-10-CM | POA: Diagnosis not present

## 2022-02-18 DIAGNOSIS — I4821 Permanent atrial fibrillation: Secondary | ICD-10-CM

## 2022-02-18 DIAGNOSIS — M545 Low back pain, unspecified: Secondary | ICD-10-CM | POA: Diagnosis not present

## 2022-02-18 NOTE — Patient Instructions (Addendum)
Medication Instructions:  Your physician recommends that you continue on your current medications as directed. Please refer to the Current Medication list given to you today.  *If you need a refill on your cardiac medications before your next appointment, please call your pharmacy*   Lab Work: None If you have labs (blood work) drawn today and your tests are completely normal, you will receive your results only by: [place] (if you have MyChart) OR A paper copy in the mail If you have any lab test that is abnormal or we need to change your treatment, we will call you to review the results.  Follow-Up: At [facility], you and your health needs are our priority.  As part of our continuing mission to provide you with exceptional heart care, we have created designated Provider Care Teams.  These Care Teams include your primary Cardiologist (physician) and Advanced Practice Providers (APPs -  Physician Assistants and Nurse Practitioners) who all work together to provide you with the care you need, when you need it.  Your next appointment:   6 month(s)  The format for your next appointment:   In Person  Provider:   Dr Radford Pax  Important Information About Sugar

## 2022-02-21 ENCOUNTER — Ambulatory Visit (HOSPITAL_COMMUNITY)
Admission: RE | Admit: 2022-02-21 | Discharge: 2022-02-21 | Disposition: A | Discharge status: Home / Self Care | Payer: Medicare Other | Source: Ambulatory Visit | Attending: Family Medicine | Admitting: Family Medicine

## 2022-02-21 DIAGNOSIS — Z1231 Encounter for screening mammogram for malignant neoplasm of breast: Secondary | ICD-10-CM | POA: Insufficient documentation

## 2022-02-25 ENCOUNTER — Ambulatory Visit (INDEPENDENT_AMBULATORY_CARE_PROVIDER_SITE_OTHER): Payer: Medicare Other | Admitting: Family Medicine

## 2022-02-25 ENCOUNTER — Encounter: Payer: Self-pay | Admitting: Family Medicine

## 2022-02-25 VITALS — BP 119/82 | HR 86 | Temp 97.3°F | Ht 62.0 in | Wt 196.8 lb

## 2022-02-25 DIAGNOSIS — I2510 Atherosclerotic heart disease of native coronary artery without angina pectoris: Secondary | ICD-10-CM | POA: Diagnosis not present

## 2022-02-25 DIAGNOSIS — R300 Dysuria: Secondary | ICD-10-CM

## 2022-02-25 DIAGNOSIS — L03031 Cellulitis of right toe: Secondary | ICD-10-CM | POA: Diagnosis not present

## 2022-02-25 DIAGNOSIS — M79674 Pain in right toe(s): Secondary | ICD-10-CM | POA: Diagnosis not present

## 2022-02-25 DIAGNOSIS — N390 Urinary tract infection, site not specified: Secondary | ICD-10-CM

## 2022-02-25 LAB — URINALYSIS, COMPLETE
Bilirubin, UA: NEGATIVE
Glucose, UA: NEGATIVE
Nitrite, UA: NEGATIVE
Specific Gravity, UA: 1.025 (ref 1.005–1.030)
Urobilinogen, Ur: 0.2 mg/dL (ref 0.2–1.0)
pH, UA: 6 (ref 5.0–7.5)

## 2022-02-25 LAB — MICROSCOPIC EXAMINATION
RBC, Urine: NONE SEEN /hpf (ref 0–2)
Renal Epithel, UA: NONE SEEN /hpf

## 2022-02-25 MED ORDER — CEFTRIAXONE SODIUM 1 G IJ SOLR
1.0000 g | Freq: Once | INTRAMUSCULAR | Status: AC
  Administered 2022-02-25: 1 g via INTRAMUSCULAR

## 2022-02-25 MED ORDER — LEVOFLOXACIN 500 MG PO TABS: 500.0000 mg | ORAL_TABLET | Freq: Every day | ORAL | 0 refills | Status: DC

## 2022-02-25 NOTE — Progress Notes (Signed)
Chief Complaint  Patient presents with   Back Pain    HPI  Patient presents today for frequency of urination with an odor for two weeks. Denies fever . flank pain more on the left than the right. Left 8/10. Right is 7/10. No nausea, vomiting. Normal appetite. BM qod.    PMH: Smoking status noted ROS: Per HPI  Objective: BP 119/82   Pulse 86   Temp (!) 97.3 F (36.3 C)   Ht '5\' 2"'$  (1.575 m)   Wt 196 lb 12.8 oz (89.3 kg)   PF 96 L/min   BMI 36.00 kg/m  Gen: NAD, alert, cooperative with exam HEENT: NCAT, EOMI, PERRL CV: RRR, good S1/S2, no murmur Resp: CTABL, no wheezes, non-labored Abd: SNTND, BS present, no guarding or organomegaly Ext: No edema, warm Neuro: Alert and oriented, No gross deficits  Assessment and plan:  1. Urinary tract infection without hematuria, site unspecified   2. Dysuria     Meds ordered this encounter  Medications   levofloxacin (LEVAQUIN) 500 MG tablet    Sig: Take 1 tablet (500 mg total) by mouth daily. For 10 days    Dispense:  10 tablet    Refill:  0   cefTRIAXone (ROCEPHIN) injection 1 g    Orders Placed This Encounter  Procedures   Microscopic Examination   Urinalysis, Complete    Follow up as needed.  [name], MD

## 2022-02-26 ENCOUNTER — Telehealth: Payer: Self-pay | Admitting: Family Medicine

## 2022-02-26 ENCOUNTER — Other Ambulatory Visit: Payer: Self-pay | Admitting: Nurse Practitioner

## 2022-02-26 DIAGNOSIS — R300 Dysuria: Secondary | ICD-10-CM

## 2022-02-26 MED ORDER — CEPHALEXIN 500 MG PO CAPS: 500.0000 mg | ORAL_CAPSULE | Freq: Two times a day (BID) | ORAL | 0 refills | Status: DC

## 2022-02-26 NOTE — Telephone Encounter (Signed)
Patient was seen 9/26 and levofloxacin (LEVAQUIN) 500 MG tablet was sent in but she does not want to take due to the warnings. She would like to know if something else can be called in instead. Please call back.

## 2022-02-26 NOTE — Telephone Encounter (Signed)
Patient aware.

## 2022-02-26 NOTE — Telephone Encounter (Signed)
Patient calling back to check on status of having something else called in. Please call back and advise.

## 2022-02-26 NOTE — Telephone Encounter (Signed)
I sent Keflex to pharmacy to pharmacy d/c current abx

## 2022-02-28 ENCOUNTER — Telehealth: Payer: Self-pay | Admitting: Family Medicine

## 2022-02-28 ENCOUNTER — Encounter: Payer: Self-pay | Admitting: *Deleted

## 2022-02-28 NOTE — Telephone Encounter (Signed)
Got pt a apt set up for next week with [facility]

## 2022-02-28 NOTE — Telephone Encounter (Signed)
Opened in error

## 2022-03-04 ENCOUNTER — Encounter: Payer: Self-pay | Admitting: Family Medicine

## 2022-03-04 ENCOUNTER — Ambulatory Visit: Payer: Medicare Other | Admitting: Internal Medicine

## 2022-03-04 ENCOUNTER — Ambulatory Visit (HOSPITAL_COMMUNITY): Payer: Medicare Other | Attending: Nurse Practitioner | Admitting: Physical Therapy

## 2022-03-04 ENCOUNTER — Ambulatory Visit (INDEPENDENT_AMBULATORY_CARE_PROVIDER_SITE_OTHER): Payer: Medicare Other | Admitting: Family Medicine

## 2022-03-04 ENCOUNTER — Encounter (HOSPITAL_COMMUNITY): Payer: Self-pay | Admitting: Physical Therapy

## 2022-03-04 VITALS — BP 125/80 | HR 82 | Temp 97.5°F | Ht 62.0 in | Wt 188.6 lb

## 2022-03-04 DIAGNOSIS — M545 Low back pain, unspecified: Secondary | ICD-10-CM | POA: Diagnosis not present

## 2022-03-04 DIAGNOSIS — R262 Difficulty in walking, not elsewhere classified: Secondary | ICD-10-CM | POA: Diagnosis not present

## 2022-03-04 DIAGNOSIS — I2510 Atherosclerotic heart disease of native coronary artery without angina pectoris: Secondary | ICD-10-CM

## 2022-03-04 DIAGNOSIS — N12 Tubulo-interstitial nephritis, not specified as acute or chronic: Secondary | ICD-10-CM

## 2022-03-04 DIAGNOSIS — B379 Candidiasis, unspecified: Secondary | ICD-10-CM

## 2022-03-04 DIAGNOSIS — M6281 Muscle weakness (generalized): Secondary | ICD-10-CM | POA: Diagnosis not present

## 2022-03-04 DIAGNOSIS — R2689 Other abnormalities of gait and mobility: Secondary | ICD-10-CM | POA: Insufficient documentation

## 2022-03-04 LAB — URINALYSIS
Bilirubin, UA: NEGATIVE
Glucose, UA: NEGATIVE
Ketones, UA: NEGATIVE
Nitrite, UA: NEGATIVE
Protein,UA: NEGATIVE
RBC, UA: NEGATIVE
Specific Gravity, UA: 1.015 (ref 1.005–1.030)
Urobilinogen, Ur: 0.2 mg/dL (ref 0.2–1.0)
pH, UA: 5.5 (ref 5.0–7.5)

## 2022-03-04 MED ORDER — FLUCONAZOLE 150 MG PO TABS: 150.0000 mg | ORAL_TABLET | Freq: Every day | ORAL | 0 refills | Status: AC

## 2022-03-04 NOTE — Therapy (Signed)
OUTPATIENT PHYSICAL THERAPY THORACOLUMBAR TREATMENT   Patient Name: Amber Stephenson MRN: [id number] DOB:July 19, 1945, 76 y.o., female Today's Date: 03/04/2022   PT End of Session - 03/04/22 1524     Visit Number 2    Number of Visits 12    Date for PT Re-Evaluation 03/24/22    Authorization Type Medicare (secondary [employer] for life)    Progress Note Due on Visit 10    PT Start Time 1519    PT Stop Time 1559    PT Time Calculation (min) 40 min    Activity Tolerance Patient tolerated treatment well    Behavior During Therapy Flat affect             Past Medical History:  Diagnosis Date   Acute exacerbation of CHF (congestive heart failure) ([place]) 12/13/2020   Acute on chronic diastolic CHF (congestive heart failure)/HFpEF Exacerbation 12/16/2020   Acute on chronic respiratory failure with hypoxia ([place]) 01/22/2019   Acute respiratory failure with hypoxia ([place]) 12/16/2020   Asthma    Atrial fibrillation with RVR 05/19/2017   Bipolar I disorder 01/23/2015   CAD (coronary artery disease)    Nonobstructive; Managed by Dr. Bronson Ing   Cardiomegaly 01/12/2018   Chronic anticoagulation 05/30/2015   Chronic atrial fibrillation ([place]) 01/04/2015   Chronic back pain    Lower back   Chronic diastolic CHF (congestive heart failure) 05/30/2015   Diabetes mellitus, type 2, without complication    Diabetic neuropathy 02/06/2016   Dysrhythmia    A-Fib   Essential hypertension    Herpes genitalis in women 07/16/2015   Hyperlipidemia    Hypoxia 02/01/2019   Insomnia 01/23/2015   Lipoma 02/08/2015   Major depressive disorder    Migraine headache with aura 02/12/2016   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   NAFL (nonalcoholic fatty liver) 38/46/6599   OSA (obstructive sleep apnea) 02/24/2019   10/09/2018 - HST  - AHI 40.6    Pulmonary hypertension    Renal insufficiency    Managed by Dr. Wallace Keller   RLS (restless legs syndrome) 04/27/2015   Past Surgical History:  Procedure  Laterality Date   BREAST REDUCTION SURGERY     EYE SURGERY Right    cateracts   HAMMER TOE SURGERY     LEFT HEART CATH AND CORONARY ANGIOGRAPHY N/A 02/03/2018   Procedure: LEFT HEART CATH AND CORONARY ANGIOGRAPHY;  Surgeon: Martinique, Peter M, MD;  Location: [facility];  Service: Cardiovascular;  Laterality: N/A;   REVERSE SHOULDER ARTHROPLASTY Right 08/19/2019   Procedure: REVERSE SHOULDER ARTHROPLASTY;  Surgeon: Netta Cedars, MD;  Location: [facility];  Service: Orthopedics;  Laterality: Right;  interscalene block   SHOULDER SURGERY Right    "I BROKE MY SHOUDLER   THIGH SURGERY     "TO REMOVE A TUMOR "   Patient Active Problem List   Diagnosis Date Noted   Microalbuminuria 01/21/2022   Tremor, essential 12/11/2021   Superficial fungus infection of skin 09/03/2021   Chronic right-sided low back pain with right-sided sciatica 07/19/2021   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[employer]) 07/14/2021   Vaginal itching 04/16/2021   Dysuria 04/16/2021   Sensory ataxia 03/28/2021   Yeast infection 01/14/2021   UTI (urinary tract infection) 12/14/2020   Elevated brain natriuretic peptide (BNP) level 12/14/2020   Elevated troponin 12/14/2020   Chronic post-traumatic stress disorder 12/06/2020   Tremor 12/06/2020   Senile nuclear sclerosis 12/06/2020   Senile corneal changes 12/06/2020   Psychoses ([place]) 12/06/2020   Presbyopia 12/06/2020  Postmenopausal bleeding 12/06/2020   Postcoital bleeding 12/06/2020   Pinguecula 12/06/2020   Pain in joint, shoulder region 12/06/2020   Other psychological or physical stress 12/06/2020   Other acquired hammer toe 12/06/2020   Osteopenia 12/06/2020   Opioid dependence ([place]) 12/06/2020   Non-neoplastic nevus 12/06/2020   Essential hypertriglyceridemia 12/06/2020   Hypermetropia 12/06/2020   Debility 12/06/2020   Cystitis 12/06/2020   Depressed bipolar I disorder in full remission ([place]) 12/06/2020   Benzodiazepine dependence ([place]) 12/06/2020   Benign neoplasm  of skin 12/06/2020   Anxiety 12/06/2020   Functional diarrhea 10/26/2020   Diabetes mellitus due to underlying condition with hyperglycemia, with long-term current use of insulin ([place]) 09/24/2020   Dyslipidemia 09/24/2020   Head trauma 09/17/2020   Chronic respiratory failure with hypoxia ([place]) 04/25/2020   Chronic constipation 04/25/2020   Back pain/sacroiliitis--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   Ambulatory dysfunction--back pain and falls 04/23/2020   Chronic pain syndrome 08/22/2019   Nerve pain 08/22/2019   Myofascial pain dysfunction syndrome 08/22/2019   CAD (coronary artery disease)    Hypocalcemia    S/P shoulder replacement, 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   History of adenomatous polyp of colon 05/21/2019   Polypharmacy 05/21/2019   Benign paroxysmal positional vertigo due to bilateral vestibular disorder 05/20/2019   Mixed hyperlipidemia 05/20/2019   Vertigo 04/25/2019   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   OSA (obstructive sleep apnea) 02/24/2019   Allergic rhinitis 02/24/2019   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   Morbid obesity with alveolar hypoventilation ([place]) 07/06/2018   Obesity (BMI 30-39.9) 07/06/2018   At risk for adverse drug event 07/06/2018   Cardiomegaly 81/06/7508   Non-alcoholic fatty liver disease 01/12/2018   Mild intermittent asthma 01/12/2018   Senile osteoporosis 12/15/2017   Fibromyalgia 03/19/2017   Migraine headache with aura 02/12/2016   Diabetic neuropathy associated with type 2 diabetes mellitus ([place]) 02/06/2016   Bipolar 1 disorder, manic, moderate ([place]) 10/04/2015   Depression, recurrent ([place]) 10/03/2015   Herpes genitalis in women 07/16/2015   Long term current use of anticoagulant 05/30/2015   Family history of coronary arteriosclerosis 05/30/2015   Chronic diastolic heart failure ([place]) 05/30/2015   Dyspnea on exertion    Essential hypertension    Restless legs  04/27/2015   Lipoma 02/08/2015   Insomnia 01/23/2015   Chronic back pain 01/04/2015   Chronic atrial fibrillation ([place]) 01/04/2015    PCP: [name], MD  REFERRING PROVIDER: Kary Kos, MD   REFERRING DIAG: M54.50 lumbago of lumbar region with sciatica   Rationale for Evaluation and Treatment Rehabilitation  THERAPY DIAG:  Low back pain, unspecified back pain laterality, unspecified chronicity, unspecified whether sciatica present  Muscle weakness (generalized)  Difficulty in walking, not elsewhere classified  Other abnormalities of gait and mobility  ONSET DATE: 1995  SUBJECTIVE:  SUBJECTIVE STATEMENT: Patient reports short term memory loss, scheduling appointment with her neurologist.  States she had been diagnosed with a kidney infection last Tuesday and is on day 7/10 of an antibiotic. Has not remembered to do her exercises but is stretching and being more active. She is feeling pretty well lately.    PAIN:  Are you having pain? Yes: NPRS scale: 4/10 Pain location: back Pain description: sharp Aggravating factors: walking, sitting, bending forward Relieving factors: heat   PRECAUTIONS: None  WEIGHT BEARING RESTRICTIONS No  FALLS:  Has patient fallen in last 6 months? No  LIVING ENVIRONMENT: Lives with: lives with their spouse Lives in: House/apartment Stairs: No steps to enter, lives on main floor of 2 story home Has following equipment at home: None  OCCUPATION: n/a  PLOF: Independent with household mobility without device, Needs assistance with ADLs, and Needs assistance with homemaking  PATIENT GOALS Reduce pain in back   OBJECTIVE:   DIAGNOSTIC FINDINGS:  IMPRESSION: There is mild 10% decrease in height of upper endplate of body of T1 vertebra suggesting  recent or old mild compression. Degenerative changes are noted in the disc spaces at T9-T10 and T10-T11 levels along with mild compression of lower endplate of body of T9 and upper endplate of body of P10 vertebrae. Findings suggest recent or old compression fractures due to trauma or disc degeneration. Please correlate with clinical symptoms, physical examination findings and consider MRI if warranted.   Schmorl's node is seen in the upper endplate of body of C58 vertebra. Paraspinal soft tissues are unremarkable.  IMPRESSION: Decrease in height of upper endplate of body of L2 vertebra has not changed since 04/28/2021 suggesting mild old compression fracture. No recent fracture is seen in the lumbar spine.   Lumbar spondylosis with encroachment of neural foramina at L4-L5 level.  PATIENT SURVEYS:  FOTO 22%  SCREENING FOR RED FLAGS: Bowel or bladder incontinence: No Spinal tumors: Yes - pt reports MD following Cauda equina syndrome: No Compression fracture: Yes: found on imagine, MD aware Abdominal aneurysm: No  COGNITION:  Overall cognitive status: Within functional limits for tasks assessed     SENSATION: WFL   POSTURE: rounded shoulders, forward head, increased thoracic kyphosis, and flexed trunk    LUMBAR ROM:   Active  A/PROM  eval  Flexion  Reaches knees P!  Extension Limited 90% P!  Right lateral flexion Limited 50%  Left lateral flexion Limited 50% P!  Right rotation Limited 25% P!  Left rotation Limited 50% P!   (Blank rows = not tested)    LOWER EXTREMITY MMT:    MMT Right eval Left eval  Hip flexion 4+ 4-  Hip extension 2 2  Hip abduction 4+ 4  Hip adduction 5 5  Hip internal rotation    Hip external rotation    Knee flexion 4+ 4-  Knee extension 5 4+  Ankle dorsiflexion 5 5  Ankle plantarflexion    Ankle inversion    Ankle eversion     (Blank rows = not tested)  LUMBAR SPECIAL TESTS:  Not tolerated during evaluation due to increased  pain level  FUNCTIONAL TESTS:  5 times sit to stand: 23.85 sec without hands, back of knees pressing into chair 2 minute walk test: 210 feet no AD supervision for safety  GAIT: Distance walked: 210 Assistive device utilized: None Level of assistance: SBA Comments: rigid trunk without reciprocal arm swing, reduced step length, limits head rotation. Occasional stagger towards Lt able to self correct.  TODAY'S TREATMENT  03/04/22 Reviewed HEP and goals Hooklying position: ball press with abd bracing 3x10 hip adduction squeeze with abd bracing 3x10 hip abduction against belt with abd bracing 3x10 Supine SLR abd bracing 1x10    Eval MMT, 2MWT, 5x sit to stand HEP Education   PATIENT EDUCATION:  Education details: Findings, POC, PT scope, HEP, symptom awareness, modalities as needed.  Person educated: Patient Education method: Explanation Education comprehension: verbalized understanding   HOME EXERCISE PROGRAM: Access Code: [email or web address] URL: [email or web address] Date: 02/10/2022 Prepared by: Candie Mile  Exercises - Supine Transversus Abdominis Bracing - Hands on Stomach  - 2 x daily - 7 x weekly - 2 sets - 10 reps - Supine Lower Trunk Rotation  - 2 x daily - 7 x weekly - 2 sets - 10 reps - Supine Figure 4 Piriformis Stretch  - 2 x daily - 7 x weekly - 2 sets - 10 second hold  ASSESSMENT:  CLINICAL IMPRESSION: Required cues for technique with HEP and progressive core exercises. Lt hip ROM >Rt with piriformis stretch, notices significant relief in symptoms. Progressed core exercises, monitoring for symptoms throughout. Reviewed Log roll technique for comfort.   OBJECTIVE IMPAIRMENTS Abnormal gait, decreased activity tolerance, decreased balance, decreased endurance, decreased knowledge of condition, decreased knowledge of use of DME, decreased mobility, difficulty walking, decreased ROM, decreased strength, increased muscle spasms, impaired flexibility,  improper body mechanics, postural dysfunction, obesity, and pain.   ACTIVITY LIMITATIONS carrying, lifting, bending, sitting, standing, squatting, stairs, transfers, bathing, dressing, reach over head, and locomotion level  PARTICIPATION LIMITATIONS: meal prep, cleaning, laundry, driving, shopping, and community activity  PERSONAL FACTORS Age, Fitness, and Time since onset of injury/illness/exacerbation are also affecting patient's functional outcome.   REHAB POTENTIAL: Good  CLINICAL DECISION MAKING: Stable/uncomplicated  EVALUATION COMPLEXITY: Low   GOALS: Goals reviewed with patient? Yes  SHORT TERM GOALS: Target date: 03/07/22  Patient will be independent with initial HEP and self-management strategies to improve functional outcomes Baseline: initiated Goal status: INITIAL   LONG TERM GOALS: Target date: 03/24/22  Patient will be independent with advanced HEP and self-management strategies to improve functional outcomes Baseline: n/a  Goal status: INITIAL  2.  Patient will improve FOTO score to predicted value of 41% to indicate improvement in functional outcomes Baseline: 22% Goal status: INITIAL  3.  Patient will report reduction of back pain to <3/10 for improved quality of life and ability to perform ADLs  Baseline: 8/10 Goal status: INITIAL  4. Patient will have equal to or > 4+/5 MMT throughout BIL LEs to improve ability to perform functional mobility, stair ambulation and ADLs.  Baseline: see above Goal status: INITIAL   5. Patient will be able to ambulate at least 275 feet during 2MWT with LRAD to demonstrate improved ability to perform functional mobility and associated tasks. Baseline: 210' no AD Goal status: INITIAL  PLAN: PT FREQUENCY: 2x/week  PT DURATION: 6 weeks  PLANNED INTERVENTIONS: Therapeutic exercises, Therapeutic activity, Neuromuscular re-education, Balance training, Gait training, Patient/Family education, Self Care, Joint mobilization,  Joint manipulation, Stair training, DME instructions, Dry Needling, Electrical stimulation, Spinal manipulation, Spinal mobilization, Cryotherapy, Moist heat, Taping, Ultrasound, Ionotophoresis '4mg'$ /ml Dexamethasone, Manual therapy, and Re-evaluation.  PLAN FOR NEXT SESSION: Gentle lumbar ROM (Manual as indicated), progressive core strengthening, balance and gait training when able.   Candie Mile, PT, DPT Physical Therapist [facility]  03/04/2022, 4:12 PM

## 2022-03-04 NOTE — Progress Notes (Signed)
No chief complaint on file.   HPI  Patient presents today for follow up of UTI. She had some diarrhea at first. She was initially scared of the levofloxacin, but decided to go forward with it and is now better. No diarrhea for 3 days.   PMH: Smoking status noted ROS: Per HPI  Objective: BP 125/80   Pulse 82   Temp (!) 97.5 F (36.4 C)   Ht '5\' 2"'$  (1.575 m)   Wt 188 lb 9.6 oz (85.5 kg)   SpO2 94%   BMI 34.50 kg/m  Gen: NAD, alert, cooperative with exam HEENT: NCAT, EOMI, PERRL CV: RRR, good S1/S2, no murmur Resp: CTABL, no wheezes, non-labored Abd: SNTND, BS present, no guarding or organomegaly Ext: No edema, warm Neuro: Alert and oriented, No gross deficits  Assessment and plan:  1. Pyelonephritis   2. Yeast infection     Meds ordered this encounter  Medications   fluconazole (DIFLUCAN) 150 MG tablet    Sig: Take 1 tablet (150 mg total) by mouth daily for 5 doses. At onset of symptoms. Repeat at end of treatment    Dispense:  5 tablet    Refill:  0    Orders Placed This Encounter  Procedures   Urine Culture   Urinalysis    Follow up as needed.  [name], MD

## 2022-03-05 LAB — URINE CULTURE: Organism ID, Bacteria: NO GROWTH

## 2022-03-06 ENCOUNTER — Encounter (HOSPITAL_COMMUNITY): Payer: Medicare Other | Admitting: Physical Therapy

## 2022-03-10 ENCOUNTER — Ambulatory Visit (INDEPENDENT_AMBULATORY_CARE_PROVIDER_SITE_OTHER): Payer: Medicare Other | Admitting: Family Medicine

## 2022-03-10 ENCOUNTER — Encounter: Payer: Self-pay | Admitting: Family Medicine

## 2022-03-10 ENCOUNTER — Telehealth: Payer: Self-pay

## 2022-03-10 DIAGNOSIS — M545 Low back pain, unspecified: Secondary | ICD-10-CM | POA: Diagnosis not present

## 2022-03-10 DIAGNOSIS — G8929 Other chronic pain: Secondary | ICD-10-CM | POA: Diagnosis not present

## 2022-03-10 DIAGNOSIS — F1120 Opioid dependence, uncomplicated: Secondary | ICD-10-CM

## 2022-03-10 MED ORDER — METAXALONE 800 MG PO TABS: 800.0000 mg | ORAL_TABLET | Freq: Three times a day (TID) | ORAL | 0 refills | Status: DC | PRN

## 2022-03-10 NOTE — Progress Notes (Signed)
    Subjective:    Patient ID: Amber Stephenson, female    DOB: Aug 05, 1945, 76 y.o.   MRN: [id number]   HPI: Amber Stephenson is a 76 y.o. female presenting for muscles in back have tightened. "Seized up." Pain med not giving relief. Taking hydromorphone TID without relief, but helps a little.       03/04/2022   10:57 AM 02/25/2022    2:09 PM 02/17/2022    3:30 PM 02/05/2022   10:57 AM 01/21/2022    9:46 AM  Depression screen PHQ 2/9  Decreased Interest 0 0 0 1 0  Down, Depressed, Hopeless 0 0 0 1 0  PHQ - 2 Score 0 0 0 2 0     Relevant past medical, surgical, family and social history reviewed and updated as indicated.  Interim medical history since our last visit reviewed. Allergies and medications reviewed and updated.  ROS:  Review of Systems   Social History   Tobacco Use  Smoking Status Never  Smokeless Tobacco Never       Objective:     Wt Readings from Last 3 Encounters:  03/04/22 188 lb 9.6 oz (85.5 kg)  02/25/22 196 lb 12.8 oz (89.3 kg)  02/18/22 195 lb 9.6 oz (88.7 kg)     Exam deferred. Pt. Harboring due to COVID 19. Phone visit performed.   Assessment & Plan:  No diagnosis found.  Meds ordered this encounter  Medications   metaxalone (SKELAXIN) 800 MG tablet    Sig: Take 1 tablet (800 mg total) by mouth 3 (three) times daily as needed for muscle spasms.    Dispense:  90 tablet    Refill:  0    No orders of the defined types were placed in this encounter.     There are no diagnoses linked to this encounter.  Virtual Visit via telephone Note  I discussed the limitations, risks, security and privacy concerns of performing an evaluation and management service by telephone and the availability of in person appointments. The patient was identified with two identifiers. Pt.expressed understanding and agreed to proceed. Pt. Is at home. Dr. Livia Snellen is in his office.  Follow Up Instructions:   I discussed the assessment and treatment plan with the  patient. The patient was provided an opportunity to ask questions and all were answered. The patient agreed with the plan and demonstrated an understanding of the instructions.   The patient was advised to call back or seek an in-person evaluation if the symptoms worsen or if the condition fails to improve as anticipated.   Total minutes including chart review and phone contact time: 7   Follow up plan: Return if symptoms worsen or fail to improve.  [name], MD [place]

## 2022-03-10 NOTE — Telephone Encounter (Signed)
Amber Stephenson called and requested a muscle relaxant.   Patient has been advised to call her PCP. Dr. Dagoberto Ligas is not in office this week.

## 2022-03-11 ENCOUNTER — Encounter (HOSPITAL_COMMUNITY): Payer: Medicare Other | Admitting: Physical Therapy

## 2022-03-11 ENCOUNTER — Ambulatory Visit (INDEPENDENT_AMBULATORY_CARE_PROVIDER_SITE_OTHER): Payer: Medicare Other | Admitting: Adult Health

## 2022-03-11 ENCOUNTER — Encounter: Payer: Self-pay | Admitting: Adult Health

## 2022-03-11 ENCOUNTER — Other Ambulatory Visit (HOSPITAL_COMMUNITY)
Admission: RE | Admit: 2022-03-11 | Discharge: 2022-03-11 | Disposition: A | Discharge status: Home / Self Care | Payer: Medicare Other | Source: Ambulatory Visit | Attending: Adult Health | Admitting: Adult Health

## 2022-03-11 VITALS — BP 127/82 | HR 94 | Ht 62.0 in | Wt 194.5 lb

## 2022-03-11 DIAGNOSIS — Z1211 Encounter for screening for malignant neoplasm of colon: Secondary | ICD-10-CM | POA: Diagnosis not present

## 2022-03-11 DIAGNOSIS — K649 Unspecified hemorrhoids: Secondary | ICD-10-CM | POA: Diagnosis not present

## 2022-03-11 DIAGNOSIS — L290 Pruritus ani: Secondary | ICD-10-CM

## 2022-03-11 DIAGNOSIS — K625 Hemorrhage of anus and rectum: Secondary | ICD-10-CM

## 2022-03-11 DIAGNOSIS — R195 Other fecal abnormalities: Secondary | ICD-10-CM

## 2022-03-11 DIAGNOSIS — N898 Other specified noninflammatory disorders of vagina: Secondary | ICD-10-CM

## 2022-03-11 DIAGNOSIS — B379 Candidiasis, unspecified: Secondary | ICD-10-CM

## 2022-03-11 DIAGNOSIS — I2510 Atherosclerotic heart disease of native coronary artery without angina pectoris: Secondary | ICD-10-CM

## 2022-03-11 HISTORY — DX: Unspecified hemorrhoids: K64.9

## 2022-03-11 HISTORY — DX: Other fecal abnormalities: R19.5

## 2022-03-11 LAB — HEMOCCULT GUIAC POC 1CARD (OFFICE): Fecal Occult Blood, POC: POSITIVE — AB

## 2022-03-11 MED ORDER — FLUCONAZOLE 100 MG PO TABS: ORAL_TABLET | ORAL | 0 refills | Status: DC

## 2022-03-11 NOTE — Progress Notes (Signed)
  Subjective:     Patient ID: Amber Stephenson, female   DOB: Jun 25, 1945, 76 y.o.   MRN: [id number]  HPI Amber Stephenson is a 76 year old white female, married, PM in complaining of vaginal and rectal itching, she has had rectal bleeding at times.  PCP is Dr Livia Snellen.  Review of Systems +vaginal and rectal itching +rectal bleeding at times Reviewed past medical,surgical, social and family history. Reviewed medications and allergies.     Objective:   Physical Exam BP 127/82 (BP Location: Left Arm, Patient Position: Sitting, Cuff Size: Normal)   Pulse 94   Ht '5\' 2"'$  (1.575 m)   Wt 194 lb 8 oz (88.2 kg)   BMI 35.57 kg/m     Skin warm and dry.Pelvic: external genitalia is normal in appearance no lesions, vagina: white discharge without odor, red in folds of labia,urethra has no lesions or masses noted, cervix:smooth, uterus: normal size, shape and contour, non tender, no masses felt, adnexa: no masses or tenderness noted. Bladder is non tender and no masses felt. On rectal exam has hemorrhoid and hemoccult was +. CV swab obtained for BV and yeast. Painted labia and vagina with gentian violet.  [facility] - 03/11/22 1122       Pregnancy Intention Screening   Does the patient want to become pregnant in the next year? N/A    Does the patient's partner want to become pregnant in the next year? N/A    Would the patient like to discuss contraceptive options today? N/A      Contraception Wrap Up   Current Method No Method - Other Reason   postmenopausal   End Method No Method - Other Reason   postmenopausal   Contraception Counseling Provided No            Examination chaperoned by Levy Pupa LPN  Assessment:     1. Vaginal itching CV swab sent for BV and yeast  Painted vagina with gentian violet and rx given for diflucan  2. Rectal itching  3. Hemorrhoids, unspecified hemorrhoid type  4. Rectal bleeding Hemoccult was +has colonoscopy end of the is month she says   5. Positive fecal  occult blood test Has colonoscopy this month she days   6. Encounter for screening fecal occult blood testing +hemoccult she has colonoscopy with Dr Percell Miller at the end of this month she says   7. Yeast infection +itching for 2 weeks  CV swab sent for BV and yeast  Painted with gentian violet  Will rx diflucan Meds ordered this encounter  Medications   fluconazole (DIFLUCAN) 100 MG tablet    Sig: Take 1 daily for 10 days, DO NOT TAKE LIPITOR while taking diflucan    Dispense:  10 tablet    Refill:  0    Order Specific Question:   Supervising Provider    Answer:   Florian Buff [[id number]]       Plan:     Follow up 03/26/22 for recheck

## 2022-03-12 LAB — CERVICOVAGINAL ANCILLARY ONLY
Bacterial Vaginitis (gardnerella): NEGATIVE
Candida Glabrata: NEGATIVE
Candida Vaginitis: NEGATIVE
Comment: NEGATIVE
Comment: NEGATIVE
Comment: NEGATIVE

## 2022-03-13 ENCOUNTER — Ambulatory Visit (HOSPITAL_COMMUNITY): Payer: Medicare Other | Admitting: Physical Therapy

## 2022-03-13 DIAGNOSIS — M6281 Muscle weakness (generalized): Secondary | ICD-10-CM

## 2022-03-13 DIAGNOSIS — M545 Low back pain, unspecified: Secondary | ICD-10-CM | POA: Diagnosis not present

## 2022-03-13 DIAGNOSIS — R2689 Other abnormalities of gait and mobility: Secondary | ICD-10-CM

## 2022-03-13 DIAGNOSIS — R262 Difficulty in walking, not elsewhere classified: Secondary | ICD-10-CM | POA: Diagnosis not present

## 2022-03-13 NOTE — Therapy (Signed)
OUTPATIENT PHYSICAL THERAPY THORACOLUMBAR TREATMENT   Patient Name: Amber Stephenson MRN: [id number] DOB:August 12, 1945, 76 y.o., female Today's Date: 03/13/2022   PT End of Session - 03/13/22 1359     Visit Number 3    Number of Visits 12    Date for PT Re-Evaluation 03/24/22    Authorization Type Medicare (secondary [employer] for life)    Progress Note Due on Visit 10    PT Start Time 1350    PT Stop Time 1430    PT Time Calculation (min) 40 min    Activity Tolerance Patient tolerated treatment well    Behavior During Therapy Flat affect             Past Medical History:  Diagnosis Date   Acute exacerbation of CHF (congestive heart failure) ([place]) 12/13/2020   Acute on chronic diastolic CHF (congestive heart failure)/HFpEF Exacerbation 12/16/2020   Acute on chronic respiratory failure with hypoxia ([place]) 01/22/2019   Acute respiratory failure with hypoxia ([place]) 12/16/2020   Asthma    Atrial fibrillation with RVR 05/19/2017   Bipolar I disorder 01/23/2015   CAD (coronary artery disease)    Nonobstructive; Managed by Dr. Bronson Ing   Cardiomegaly 01/12/2018   Chronic anticoagulation 05/30/2015   Chronic atrial fibrillation ([place]) 01/04/2015   Chronic back pain    Lower back   Chronic diastolic CHF (congestive heart failure) 05/30/2015   Diabetes mellitus, type 2, without complication    Diabetic neuropathy 02/06/2016   Dysrhythmia    A-Fib   Essential hypertension    Herpes genitalis in women 07/16/2015   Hyperlipidemia    Hypoxia 02/01/2019   Insomnia 01/23/2015   Lipoma 02/08/2015   Major depressive disorder    Migraine headache with aura 02/12/2016   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   NAFL (nonalcoholic fatty liver) 23/53/6144   OSA (obstructive sleep apnea) 02/24/2019   10/09/2018 - HST  - AHI 40.6    Pulmonary hypertension    Renal insufficiency    Managed by Dr. Wallace Keller   RLS (restless legs syndrome) 04/27/2015   Past Surgical History:   Procedure Laterality Date   BREAST REDUCTION SURGERY     EYE SURGERY Right    cateracts   HAMMER TOE SURGERY     LEFT HEART CATH AND CORONARY ANGIOGRAPHY N/A 02/03/2018   Procedure: LEFT HEART CATH AND CORONARY ANGIOGRAPHY;  Surgeon: Martinique, Peter M, MD;  Location: [facility];  Service: Cardiovascular;  Laterality: N/A;   REVERSE SHOULDER ARTHROPLASTY Right 08/19/2019   Procedure: REVERSE SHOULDER ARTHROPLASTY;  Surgeon: Netta Cedars, MD;  Location: [facility];  Service: Orthopedics;  Laterality: Right;  interscalene block   SHOULDER SURGERY Right    "I BROKE MY SHOUDLER   THIGH SURGERY     "TO REMOVE A TUMOR "   Patient Active Problem List   Diagnosis Date Noted   Positive fecal occult blood test 03/11/2022   Rectal bleeding 03/11/2022   Hemorrhoids 03/11/2022   Rectal itching 03/11/2022   Encounter for screening fecal occult blood testing 03/11/2022   Microalbuminuria 01/21/2022   Tremor, essential 12/11/2021   Superficial fungus infection of skin 09/03/2021   Chronic right-sided low back pain with right-sided sciatica 07/19/2021   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   Vaginal itching 04/16/2021   Dysuria 04/16/2021   Sensory ataxia 03/28/2021   Yeast infection 01/14/2021   UTI (urinary tract infection) 12/14/2020   Elevated brain natriuretic peptide (BNP) level 12/14/2020   Elevated troponin 12/14/2020  Chronic post-traumatic stress disorder 12/06/2020   Tremor 12/06/2020   Senile nuclear sclerosis 12/06/2020   Senile corneal changes 12/06/2020   Psychoses ([place]) 12/06/2020   Presbyopia 12/06/2020   Postmenopausal bleeding 12/06/2020   Postcoital bleeding 12/06/2020   Pinguecula 12/06/2020   Pain in joint, shoulder region 12/06/2020   Other psychological or physical stress 12/06/2020   Other acquired hammer toe 12/06/2020   Osteopenia 12/06/2020   Opioid dependence ([place]) 12/06/2020   Non-neoplastic nevus 12/06/2020   Essential hypertriglyceridemia  12/06/2020   Hypermetropia 12/06/2020   Debility 12/06/2020   Cystitis 12/06/2020   Depressed bipolar I disorder in full remission ([place]) 12/06/2020   Benzodiazepine dependence ([place]) 12/06/2020   Benign neoplasm of skin 12/06/2020   Anxiety 12/06/2020   Functional diarrhea 10/26/2020   Diabetes mellitus due to underlying condition with hyperglycemia, with long-term current use of insulin ([place]) 09/24/2020   Dyslipidemia 09/24/2020   Head trauma 09/17/2020   Chronic respiratory failure with hypoxia ([place]) 04/25/2020   Chronic constipation 04/25/2020   Back pain/sacroiliitis--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   Ambulatory dysfunction--back pain and falls 04/23/2020   Chronic pain syndrome 08/22/2019   Nerve pain 08/22/2019   Myofascial pain dysfunction syndrome 08/22/2019   CAD (coronary artery disease)    Hypocalcemia    S/P shoulder replacement, 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   History of adenomatous polyp of colon 05/21/2019   Polypharmacy 05/21/2019   Benign paroxysmal positional vertigo due to bilateral vestibular disorder 05/20/2019   Mixed hyperlipidemia 05/20/2019   Vertigo 04/25/2019   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   OSA (obstructive sleep apnea) 02/24/2019   Allergic rhinitis 02/24/2019   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   Morbid obesity with alveolar hypoventilation ([place]) 07/06/2018   Obesity (BMI 30-39.9) 07/06/2018   At risk for adverse drug event 07/06/2018   Cardiomegaly 40/98/1191   Non-alcoholic fatty liver disease 01/12/2018   Mild intermittent asthma 01/12/2018   Senile osteoporosis 12/15/2017   Fibromyalgia 03/19/2017   Migraine headache with aura 02/12/2016   Diabetic neuropathy associated with type 2 diabetes mellitus ([place]) 02/06/2016   Bipolar 1 disorder, manic, moderate ([place]) 10/04/2015   Depression, recurrent ([place]) 10/03/2015   Herpes genitalis in women 07/16/2015   Long term current  use of anticoagulant 05/30/2015   Family history of coronary arteriosclerosis 05/30/2015   Chronic diastolic heart failure ([place]) 05/30/2015   Dyspnea on exertion    Essential hypertension    Restless legs 04/27/2015   Lipoma 02/08/2015   Insomnia 01/23/2015   Chronic back pain 01/04/2015   Chronic atrial fibrillation ([place]) 01/04/2015    PCP: [name], MD  REFERRING PROVIDER: Kary Kos, MD   REFERRING DIAG: M54.50 lumbago of lumbar region with sciatica   Rationale for Evaluation and Treatment Rehabilitation  THERAPY DIAG:  No diagnosis found.  ONSET DATE: 1995  SUBJECTIVE:  SUBJECTIVE STATEMENT: Patient returns today following last 3 appts cancelled.  States she was unaware of having to do any exercises at home.  Reports she had a bad kidney infection and was on some strong medicine.  States she also had to reschedule her neurologist appt as she is going for short term memory loss.  Lower back pain is increased today but she has been laying a lot and feels very weak.    PAIN:  Are you having pain? Yes: NPRS scale: 6/10 Pain location: back Pain description: achey Aggravating factors: walking, sitting, bending forward Relieving factors: heat   PRECAUTIONS: None  WEIGHT BEARING RESTRICTIONS No  FALLS:  Has patient fallen in last 6 months? No  LIVING ENVIRONMENT: Lives with: lives with their spouse Lives in: House/apartment Stairs: No steps to enter, lives on main floor of 2 story home Has following equipment at home: None  OCCUPATION: n/a  PLOF: Independent with household mobility without device, Needs assistance with ADLs, and Needs assistance with homemaking  PATIENT GOALS Reduce pain in back   OBJECTIVE:   DIAGNOSTIC FINDINGS:  IMPRESSION: There is mild 10% decrease  in height of upper endplate of body of T1 vertebra suggesting recent or old mild compression. Degenerative changes are noted in the disc spaces at T9-T10 and T10-T11 levels along with mild compression of lower endplate of body of T9 and upper endplate of body of T70 vertebrae. Findings suggest recent or old compression fractures due to trauma or disc degeneration. Please correlate with clinical symptoms, physical examination findings and consider MRI if warranted.   Schmorl's node is seen in the upper endplate of body of Y17 vertebra. Paraspinal soft tissues are unremarkable.  IMPRESSION: Decrease in height of upper endplate of body of L2 vertebra has not changed since 04/28/2021 suggesting mild old compression fracture. No recent fracture is seen in the lumbar spine.   Lumbar spondylosis with encroachment of neural foramina at L4-L5 level.  PATIENT SURVEYS:  FOTO 22%  SCREENING FOR RED FLAGS: Bowel or bladder incontinence: No Spinal tumors: Yes - pt reports MD following Cauda equina syndrome: No Compression fracture: Yes: found on imagine, MD aware Abdominal aneurysm: No  COGNITION:  Overall cognitive status: Within functional limits for tasks assessed     SENSATION: WFL   POSTURE: rounded shoulders, forward head, increased thoracic kyphosis, and flexed trunk    LUMBAR ROM:   Active  A/PROM  eval  Flexion  Reaches knees P!  Extension Limited 90% P!  Right lateral flexion Limited 50%  Left lateral flexion Limited 50% P!  Right rotation Limited 25% P!  Left rotation Limited 50% P!   (Blank rows = not tested)    LOWER EXTREMITY MMT:    MMT Right eval Left eval  Hip flexion 4+ 4-  Hip extension 2 2  Hip abduction 4+ 4  Hip adduction 5 5  Hip internal rotation    Hip external rotation    Knee flexion 4+ 4-  Knee extension 5 4+  Ankle dorsiflexion 5 5  Ankle plantarflexion    Ankle inversion    Ankle eversion     (Blank rows = not tested)  LUMBAR  SPECIAL TESTS:  Not tolerated during evaluation due to increased pain level  FUNCTIONAL TESTS:  5 times sit to stand: 23.85 sec without hands, back of knees pressing into chair 2 minute walk test: 210 feet no AD supervision for safety  GAIT: Distance walked: 210 Assistive device utilized: None Level of assistance: SBA Comments:  rigid trunk without reciprocal arm swing, reduced step length, limits head rotation. Occasional stagger towards Lt able to self correct.    TODAY'S TREATMENT  03/13/22 Supine:  abdominal isometrics 10X5"  Bridge 10X  SLR 10X  Hip add squeeze 10X5"  Clams 10X each Side lying clams 10X5" each  Hip abduction 10X each Logroll/ transfers supine to /from sit  03/04/22 Reviewed HEP and goals Hooklying position: ball press with abd bracing 3x10 hip adduction squeeze with abd bracing 3x10 hip abduction against belt with abd bracing 3x10 Supine SLR abd bracing 1x10    Eval MMT, 2MWT, 5x sit to stand HEP Education   PATIENT EDUCATION:  Education details: Findings, POC, PT scope, HEP, symptom awareness, modalities as needed.  Person educated: Patient Education method: Explanation Education comprehension: verbalized understanding   HOME EXERCISE PROGRAM: Access Code: [email or web address] URL: [email or web address] Date: 02/10/2022 Prepared by: Candie Mile  Exercises - Supine Transversus Abdominis Bracing - Hands on Stomach  - 2 x daily - 7 x weekly - 2 sets - 10 reps - Supine Lower Trunk Rotation  - 2 x daily - 7 x weekly - 2 sets - 10 reps - Supine Figure 4 Piriformis Stretch  - 2 x daily - 7 x weekly - 2 sets - 10 second hold  ASSESSMENT:  CLINICAL IMPRESSION: Pt with noted weakness, trembling fatigue during session today.  Required rest breaks, cues for breathing and completing therex slowly and controlled.  Began side lying exercises and worked on basic bed mobility. Attempted extension lying in prone, however reported increased  symptoms so discontinued.   Encouraged to increase compliance with HEP and complete 2X day at home.  Pt verbalized understanding.  Pt will continue to benefit from skilled therapy to address deficits and increase overall functioning.    OBJECTIVE IMPAIRMENTS Abnormal gait, decreased activity tolerance, decreased balance, decreased endurance, decreased knowledge of condition, decreased knowledge of use of DME, decreased mobility, difficulty walking, decreased ROM, decreased strength, increased muscle spasms, impaired flexibility, improper body mechanics, postural dysfunction, obesity, and pain.   ACTIVITY LIMITATIONS carrying, lifting, bending, sitting, standing, squatting, stairs, transfers, bathing, dressing, reach over head, and locomotion level  PARTICIPATION LIMITATIONS: meal prep, cleaning, laundry, driving, shopping, and community activity  PERSONAL FACTORS Age, Fitness, and Time since onset of injury/illness/exacerbation are also affecting patient's functional outcome.   REHAB POTENTIAL: Good  CLINICAL DECISION MAKING: Stable/uncomplicated  EVALUATION COMPLEXITY: Low   GOALS: Goals reviewed with patient? Yes  SHORT TERM GOALS: Target date: 03/07/22  Patient will be independent with initial HEP and self-management strategies to improve functional outcomes Baseline: initiated Goal status: IN PROGRESS   LONG TERM GOALS: Target date: 03/24/22  Patient will be independent with advanced HEP and self-management strategies to improve functional outcomes Baseline: n/a  Goal status: IN PROGRESS  2.  Patient will improve FOTO score to predicted value of 41% to indicate improvement in functional outcomes Baseline: 22% Goal status: IN PROGRESS  3.  Patient will report reduction of back pain to <3/10 for improved quality of life and ability to perform ADLs  Baseline: 8/10 Goal status: IN PROGRESS  4. Patient will have equal to or > 4+/5 MMT throughout BIL LEs to improve ability to  perform functional mobility, stair ambulation and ADLs.  Baseline: see above Goal status: IN PROGRESS   5. Patient will be able to ambulate at least 275 feet during 2MWT with LRAD to demonstrate improved ability to perform functional mobility and associated tasks. Baseline: 210' no AD Goal  status: IN PROGRESS PLAN: PT FREQUENCY: 2x/week  PT DURATION: 6 weeks  PLANNED INTERVENTIONS: Therapeutic exercises, Therapeutic activity, Neuromuscular re-education, Balance training, Gait training, Patient/Family education, Self Care, Joint mobilization, Joint manipulation, Stair training, DME instructions, Dry Needling, Electrical stimulation, Spinal manipulation, Spinal mobilization, Cryotherapy, Moist heat, Taping, Ultrasound, Ionotophoresis '4mg'$ /ml Dexamethasone, Manual therapy, and Re-evaluation.  PLAN FOR NEXT SESSION: Gentle lumbar ROM (Manual as indicated), progressive core strengthening, balance and gait training when able.  Attempt 3D hip excursions next session to promote mobility.    Teena Irani, PTA/CLT [place] Ph: [phone]  03/13/2022, 1:59 PM

## 2022-03-15 ENCOUNTER — Other Ambulatory Visit: Payer: Self-pay | Admitting: Family Medicine

## 2022-03-17 ENCOUNTER — Ambulatory Visit: Payer: Medicare Other | Admitting: Neurology

## 2022-03-18 ENCOUNTER — Encounter (HOSPITAL_COMMUNITY): Payer: Medicare Other | Admitting: Physical Therapy

## 2022-03-19 ENCOUNTER — Ambulatory Visit (HOSPITAL_COMMUNITY): Payer: Medicare Other | Admitting: Physical Therapy

## 2022-03-19 DIAGNOSIS — R262 Difficulty in walking, not elsewhere classified: Secondary | ICD-10-CM | POA: Diagnosis not present

## 2022-03-19 DIAGNOSIS — R2689 Other abnormalities of gait and mobility: Secondary | ICD-10-CM | POA: Diagnosis not present

## 2022-03-19 DIAGNOSIS — M6281 Muscle weakness (generalized): Secondary | ICD-10-CM

## 2022-03-19 DIAGNOSIS — M545 Low back pain, unspecified: Secondary | ICD-10-CM | POA: Diagnosis not present

## 2022-03-19 NOTE — Therapy (Addendum)
OUTPATIENT PHYSICAL THERAPY THORACOLUMBAR TREATMENT   Patient Name: Amber Stephenson MRN: [id number] DOB:Mar 12, 1946, 76 y.o., female Today's Date: 03/19/2022   PT End of Session - 03/19/22 1659     Visit Number 4    Number of Visits 12    Date for PT Re-Evaluation 03/24/22    Authorization Type Medicare (secondary [employer] for life)    Progress Note Due on Visit 10    PT Start Time 1655    PT Stop Time 1729    PT Time Calculation (min) 34 min    Activity Tolerance Patient tolerated treatment well    Behavior During Therapy Flat affect             Past Medical History:  Diagnosis Date   Acute exacerbation of CHF (congestive heart failure) ([place]) 12/13/2020   Acute on chronic diastolic CHF (congestive heart failure)/HFpEF Exacerbation 12/16/2020   Acute on chronic respiratory failure with hypoxia ([place]) 01/22/2019   Acute respiratory failure with hypoxia ([place]) 12/16/2020   Asthma    Atrial fibrillation with RVR 05/19/2017   Bipolar I disorder 01/23/2015   CAD (coronary artery disease)    Nonobstructive; Managed by Dr. Bronson Ing   Cardiomegaly 01/12/2018   Chronic anticoagulation 05/30/2015   Chronic atrial fibrillation ([place]) 01/04/2015   Chronic back pain    Lower back   Chronic diastolic CHF (congestive heart failure) 05/30/2015   Diabetes mellitus, type 2, without complication    Diabetic neuropathy 02/06/2016   Dysrhythmia    A-Fib   Essential hypertension    Herpes genitalis in women 07/16/2015   Hyperlipidemia    Hypoxia 02/01/2019   Insomnia 01/23/2015   Lipoma 02/08/2015   Major depressive disorder    Migraine headache with aura 02/12/2016   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   NAFL (nonalcoholic fatty liver) 73/71/0626   OSA (obstructive sleep apnea) 02/24/2019   10/09/2018 - HST  - AHI 40.6    Pulmonary hypertension    Renal insufficiency    Managed by Dr. Wallace Keller   RLS (restless legs syndrome) 04/27/2015   Past Surgical History:   Procedure Laterality Date   BREAST REDUCTION SURGERY     EYE SURGERY Right    cateracts   HAMMER TOE SURGERY     LEFT HEART CATH AND CORONARY ANGIOGRAPHY N/A 02/03/2018   Procedure: LEFT HEART CATH AND CORONARY ANGIOGRAPHY;  Surgeon: Martinique, Peter M, MD;  Location: [facility];  Service: Cardiovascular;  Laterality: N/A;   REVERSE SHOULDER ARTHROPLASTY Right 08/19/2019   Procedure: REVERSE SHOULDER ARTHROPLASTY;  Surgeon: Netta Cedars, MD;  Location: [facility];  Service: Orthopedics;  Laterality: Right;  interscalene block   SHOULDER SURGERY Right    "I BROKE MY SHOUDLER   THIGH SURGERY     "TO REMOVE A TUMOR "   Patient Active Problem List   Diagnosis Date Noted   Positive fecal occult blood test 03/11/2022   Rectal bleeding 03/11/2022   Hemorrhoids 03/11/2022   Rectal itching 03/11/2022   Encounter for screening fecal occult blood testing 03/11/2022   Microalbuminuria 01/21/2022   Tremor, essential 12/11/2021   Superficial fungus infection of skin 09/03/2021   Chronic right-sided low back pain with right-sided sciatica 07/19/2021   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   Vaginal itching 04/16/2021   Dysuria 04/16/2021   Sensory ataxia 03/28/2021   Yeast infection 01/14/2021   UTI (urinary tract infection) 12/14/2020   Elevated brain natriuretic peptide (BNP) level 12/14/2020   Elevated troponin 12/14/2020  Chronic post-traumatic stress disorder 12/06/2020   Tremor 12/06/2020   Senile nuclear sclerosis 12/06/2020   Senile corneal changes 12/06/2020   Psychoses ([place]) 12/06/2020   Presbyopia 12/06/2020   Postmenopausal bleeding 12/06/2020   Postcoital bleeding 12/06/2020   Pinguecula 12/06/2020   Pain in joint, shoulder region 12/06/2020   Other psychological or physical stress 12/06/2020   Other acquired hammer toe 12/06/2020   Osteopenia 12/06/2020   Opioid dependence ([place]) 12/06/2020   Non-neoplastic nevus 12/06/2020   Essential hypertriglyceridemia  12/06/2020   Hypermetropia 12/06/2020   Debility 12/06/2020   Cystitis 12/06/2020   Depressed bipolar I disorder in full remission ([place]) 12/06/2020   Benzodiazepine dependence ([place]) 12/06/2020   Benign neoplasm of skin 12/06/2020   Anxiety 12/06/2020   Functional diarrhea 10/26/2020   Diabetes mellitus due to underlying condition with hyperglycemia, with long-term current use of insulin ([place]) 09/24/2020   Dyslipidemia 09/24/2020   Head trauma 09/17/2020   Chronic respiratory failure with hypoxia ([place]) 04/25/2020   Chronic constipation 04/25/2020   Back pain/sacroiliitis--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   Ambulatory dysfunction--back pain and falls 04/23/2020   Chronic pain syndrome 08/22/2019   Nerve pain 08/22/2019   Myofascial pain dysfunction syndrome 08/22/2019   CAD (coronary artery disease)    Hypocalcemia    S/P shoulder replacement, 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   History of adenomatous polyp of colon 05/21/2019   Polypharmacy 05/21/2019   Benign paroxysmal positional vertigo due to bilateral vestibular disorder 05/20/2019   Mixed hyperlipidemia 05/20/2019   Vertigo 04/25/2019   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   OSA (obstructive sleep apnea) 02/24/2019   Allergic rhinitis 02/24/2019   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   Morbid obesity with alveolar hypoventilation ([place]) 07/06/2018   Obesity (BMI 30-39.9) 07/06/2018   At risk for adverse drug event 07/06/2018   Cardiomegaly 02/54/2706   Non-alcoholic fatty liver disease 01/12/2018   Mild intermittent asthma 01/12/2018   Senile osteoporosis 12/15/2017   Fibromyalgia 03/19/2017   Migraine headache with aura 02/12/2016   Diabetic neuropathy associated with type 2 diabetes mellitus ([place]) 02/06/2016   Bipolar 1 disorder, manic, moderate ([place]) 10/04/2015   Depression, recurrent ([place]) 10/03/2015   Herpes genitalis in women 07/16/2015   Long term current  use of anticoagulant 05/30/2015   Family history of coronary arteriosclerosis 05/30/2015   Chronic diastolic heart failure ([place]) 05/30/2015   Dyspnea on exertion    Essential hypertension    Restless legs 04/27/2015   Lipoma 02/08/2015   Insomnia 01/23/2015   Chronic back pain 01/04/2015   Chronic atrial fibrillation ([place]) 01/04/2015    PCP: [name], MD  REFERRING PROVIDER: Kary Kos, MD   REFERRING DIAG: M54.50 lumbago of lumbar region with sciatica   Rationale for Evaluation and Treatment Rehabilitation  THERAPY DIAG:  Low back pain, unspecified back pain laterality, unspecified chronicity, unspecified whether sciatica present  Muscle weakness (generalized)  Difficulty in walking, not elsewhere classified  Other abnormalities of gait and mobility  ONSET DATE: 1995  SUBJECTIVE:  SUBJECTIVE STATEMENT: Patient states she feels much better today with less pain as compared to last visit.  Currently 3-4/10. States her kidney infection is now gone and she has completing her HEP.    PAIN:  Are you having pain? Yes: NPRS scale: 6/10 Pain location: back Pain description: achey Aggravating factors: walking, sitting, bending forward Relieving factors: heat   PRECAUTIONS: None  WEIGHT BEARING RESTRICTIONS No  FALLS:  Has patient fallen in last 6 months? No  LIVING ENVIRONMENT: Lives with: lives with their spouse Lives in: House/apartment Stairs: No steps to enter, lives on main floor of 2 story home Has following equipment at home: None  OCCUPATION: n/a  PLOF: Independent with household mobility without device, Needs assistance with ADLs, and Needs assistance with homemaking  PATIENT GOALS Reduce pain in back   OBJECTIVE:   DIAGNOSTIC FINDINGS:  IMPRESSION: There is mild  10% decrease in height of upper endplate of body of T1 vertebra suggesting recent or old mild compression. Degenerative changes are noted in the disc spaces at T9-T10 and T10-T11 levels along with mild compression of lower endplate of body of T9 and upper endplate of body of W43 vertebrae. Findings suggest recent or old compression fractures due to trauma or disc degeneration. Please correlate with clinical symptoms, physical examination findings and consider MRI if warranted.   Schmorl's node is seen in the upper endplate of body of X54 vertebra. Paraspinal soft tissues are unremarkable.  IMPRESSION: Decrease in height of upper endplate of body of L2 vertebra has not changed since 04/28/2021 suggesting mild old compression fracture. No recent fracture is seen in the lumbar spine.   Lumbar spondylosis with encroachment of neural foramina at L4-L5 level.  PATIENT SURVEYS:  FOTO 22%  SCREENING FOR RED FLAGS: Bowel or bladder incontinence: No Spinal tumors: Yes - pt reports MD following Cauda equina syndrome: No Compression fracture: Yes: found on imagine, MD aware Abdominal aneurysm: No  COGNITION:  Overall cognitive status: Within functional limits for tasks assessed     SENSATION: WFL   POSTURE: rounded shoulders, forward head, increased thoracic kyphosis, and flexed trunk    LUMBAR ROM:   Active  A/PROM  eval  Flexion  Reaches knees P!  Extension Limited 90% P!  Right lateral flexion Limited 50%  Left lateral flexion Limited 50% P!  Right rotation Limited 25% P!  Left rotation Limited 50% P!   (Blank rows = not tested)    LOWER EXTREMITY MMT:    MMT Right eval Left eval  Hip flexion 4+ 4-  Hip extension 2 2  Hip abduction 4+ 4  Hip adduction 5 5  Hip internal rotation    Hip external rotation    Knee flexion 4+ 4-  Knee extension 5 4+  Ankle dorsiflexion 5 5  Ankle plantarflexion    Ankle inversion    Ankle eversion     (Blank rows = not  tested)  LUMBAR SPECIAL TESTS:  Not tolerated during evaluation due to increased pain level  FUNCTIONAL TESTS:  5 times sit to stand: 23.85 sec without hands, back of knees pressing into chair 2 minute walk test: 210 feet no AD supervision for safety  GAIT: Distance walked: 210 Assistive device utilized: None Level of assistance: SBA Comments: rigid trunk without reciprocal arm swing, reduced step length, limits head rotation. Occasional stagger towards Lt able to self correct.    TODAY'S TREATMENT  03/19/22 Standing:  3D hip excursions 10X in painfree ROM Logroll/transfers (much improved today) Supine:  Abd iso 20X5"  Bridge 2X10  Hip add squeeze 20X5'  SLR 10X2 sets each  KTC 3X15" holds each Sidelying hip abduction 2X10 each  Clams 10X5" 2 sets each side  03/13/22 Supine:  abdominal isometrics 10X5"  Bridge 10X  SLR 10X  Hip add squeeze 10X5"  Clams 10X each Side lying clams 10X5" each  Hip abduction 10X each Logroll/ transfers supine to /from sit  03/04/22 Reviewed HEP and goals Hooklying position: ball press with abd bracing 3x10 hip adduction squeeze with abd bracing 3x10 hip abduction against belt with abd bracing 3x10 Supine SLR abd bracing 1x10    Eval MMT, 2MWT, 5x sit to stand HEP Education   PATIENT EDUCATION:  Education details: Findings, POC, PT scope, HEP, symptom awareness, modalities as needed.  Person educated: Patient Education method: Explanation Education comprehension: verbalized understanding   HOME EXERCISE PROGRAM: Access Code: [email or web address] URL: [email or web address] Date: 02/10/2022 Prepared by: Candie Mile  Exercises - Supine Transversus Abdominis Bracing - Hands on Stomach  - 2 x daily - 7 x weekly - 2 sets - 10 reps - Supine Lower Trunk Rotation  - 2 x daily - 7 x weekly - 2 sets - 10 reps - Supine Figure 4 Piriformis Stretch  - 2 x daily - 7 x weekly - 2 sets - 10 second hold  ASSESSMENT:  CLINICAL  IMPRESSION: Pt with much improvement today with less weakness and no trembling as was present last session.  Began with addition of Hip excursions to improve mobility with completion in limited ROM but without pain.  Pt did  present with fatigue from exercises as noted by heavy breathing requiring several rest breaks.  Increased ease with bed mobility as well as rolling to either side on bed.  Pt overall pleased and reported addition of stretches felt good. Pt will continue to benefit from skilled therapy to address deficits and increase overall functioning.    OBJECTIVE IMPAIRMENTS Abnormal gait, decreased activity tolerance, decreased balance, decreased endurance, decreased knowledge of condition, decreased knowledge of use of DME, decreased mobility, difficulty walking, decreased ROM, decreased strength, increased muscle spasms, impaired flexibility, improper body mechanics, postural dysfunction, obesity, and pain.   ACTIVITY LIMITATIONS carrying, lifting, bending, sitting, standing, squatting, stairs, transfers, bathing, dressing, reach over head, and locomotion level  PARTICIPATION LIMITATIONS: meal prep, cleaning, laundry, driving, shopping, and community activity  PERSONAL FACTORS Age, Fitness, and Time since onset of injury/illness/exacerbation are also affecting patient's functional outcome.   REHAB POTENTIAL: Good  CLINICAL DECISION MAKING: Stable/uncomplicated  EVALUATION COMPLEXITY: Low   GOALS: Goals reviewed with patient? Yes  SHORT TERM GOALS: Target date: 03/07/22  Patient will be independent with initial HEP and self-management strategies to improve functional outcomes Baseline: initiated Goal status: IN PROGRESS   LONG TERM GOALS: Target date: 03/24/22  Patient will be independent with advanced HEP and self-management strategies to improve functional outcomes Baseline: n/a  Goal status: IN PROGRESS  2.  Patient will improve FOTO score to predicted value of 41% to  indicate improvement in functional outcomes Baseline: 22% Goal status: IN PROGRESS  3.  Patient will report reduction of back pain to <3/10 for improved quality of life and ability to perform ADLs  Baseline: 8/10 Goal status: IN PROGRESS  4. Patient will have equal to or > 4+/5 MMT throughout BIL LEs to improve ability to perform functional mobility, stair ambulation and ADLs.  Baseline: see above Goal status: IN PROGRESS   5. Patient will be able to ambulate at  least 275 feet during 2MWT with LRAD to demonstrate improved ability to perform functional mobility and associated tasks. Baseline: 210' no AD Goal status: IN PROGRESS PLAN: PT FREQUENCY: 2x/week  PT DURATION: 6 weeks  PLANNED INTERVENTIONS: Therapeutic exercises, Therapeutic activity, Neuromuscular re-education, Balance training, Gait training, Patient/Family education, Self Care, Joint mobilization, Joint manipulation, Stair training, DME instructions, Dry Needling, Electrical stimulation, Spinal manipulation, Spinal mobilization, Cryotherapy, Moist heat, Taping, Ultrasound, Ionotophoresis '4mg'$ /ml Dexamethasone, Manual therapy, and Re-evaluation.  PLAN FOR NEXT SESSION: Gentle lumbar ROM (Manual as indicated), progressive core strengthening, balance and gait training when able.  Progress as tolerated.    Teena Irani, PTA/CLT [place] Ph: [phone]  03/19/2022, 5:25 PM

## 2022-03-20 ENCOUNTER — Encounter (HOSPITAL_COMMUNITY): Payer: Medicare Other | Admitting: Physical Therapy

## 2022-03-25 ENCOUNTER — Encounter (HOSPITAL_COMMUNITY): Payer: Medicare Other | Admitting: Physical Therapy

## 2022-03-26 ENCOUNTER — Ambulatory Visit: Payer: Medicare Other | Admitting: Adult Health

## 2022-03-26 ENCOUNTER — Encounter: Payer: Self-pay | Admitting: Family Medicine

## 2022-03-26 ENCOUNTER — Ambulatory Visit (INDEPENDENT_AMBULATORY_CARE_PROVIDER_SITE_OTHER): Payer: Medicare Other | Admitting: Family Medicine

## 2022-03-26 VITALS — BP 153/83 | HR 74 | Temp 97.1°F | Ht 62.0 in | Wt 193.8 lb

## 2022-03-26 DIAGNOSIS — I2510 Atherosclerotic heart disease of native coronary artery without angina pectoris: Secondary | ICD-10-CM | POA: Diagnosis not present

## 2022-03-26 DIAGNOSIS — Z23 Encounter for immunization: Secondary | ICD-10-CM

## 2022-03-26 DIAGNOSIS — N3289 Other specified disorders of bladder: Secondary | ICD-10-CM | POA: Diagnosis not present

## 2022-03-26 DIAGNOSIS — Z91041 Radiographic dye allergy status: Secondary | ICD-10-CM | POA: Diagnosis not present

## 2022-03-26 DIAGNOSIS — R1012 Left upper quadrant pain: Secondary | ICD-10-CM

## 2022-03-26 DIAGNOSIS — R300 Dysuria: Secondary | ICD-10-CM

## 2022-03-26 LAB — MICROSCOPIC EXAMINATION

## 2022-03-26 LAB — URINALYSIS, COMPLETE
Bilirubin, UA: NEGATIVE
Glucose, UA: NEGATIVE
Ketones, UA: NEGATIVE
Nitrite, UA: NEGATIVE
RBC, UA: NEGATIVE
Specific Gravity, UA: 1.01 (ref 1.005–1.030)
Urobilinogen, Ur: 0.2 mg/dL (ref 0.2–1.0)
pH, UA: 6 (ref 5.0–7.5)

## 2022-03-26 MED ORDER — PREDNISONE 50 MG PO TABS: ORAL_TABLET | ORAL | 0 refills | Status: DC

## 2022-03-26 MED ORDER — SOLIFENACIN SUCCINATE 5 MG PO TABS: 5.0000 mg | ORAL_TABLET | Freq: Every day | ORAL | 5 refills | Status: DC

## 2022-03-26 NOTE — Progress Notes (Signed)
Subjective:  Patient ID: Amber Stephenson, female    DOB: 02-13-1946  Age: 76 y.o. MRN: [id number]  CC: Dysuria   HPI Amber Stephenson presents for Frequent urination ongoing since before last visit. No dysuria. No incontinence. Passing only a little bit each time she urinates.      03/26/2022    8:09 AM 03/04/2022   10:57 AM 02/25/2022    2:09 PM  Depression screen PHQ 2/9  Decreased Interest 0 0 0  Down, Depressed, Hopeless 0 0 0  PHQ - 2 Score 0 0 0    History Amber Stephenson has a past medical history of Acute exacerbation of CHF (congestive heart failure) ([place]) (12/13/2020), Acute on chronic diastolic CHF (congestive heart failure)/HFpEF Exacerbation (12/16/2020), Acute on chronic respiratory failure with hypoxia ([place]) (01/22/2019), Acute respiratory failure with hypoxia ([place]) (12/16/2020), Asthma, Atrial fibrillation with RVR (05/19/2017), Bipolar I disorder (01/23/2015), CAD (coronary artery disease), Cardiomegaly (01/12/2018), Chronic anticoagulation (05/30/2015), Chronic atrial fibrillation ([place]) (01/04/2015), Chronic back pain, Chronic diastolic CHF (congestive heart failure) (05/30/2015), Diabetes mellitus, type 2, without complication, Diabetic neuropathy (02/06/2016), Dysrhythmia, Essential hypertension, Herpes genitalis in women (07/16/2015), Hyperlipidemia, Hypoxia (02/01/2019), Insomnia (01/23/2015), Lipoma (02/08/2015), Major depressive disorder, Migraine headache with aura (02/12/2016), 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04/21/2019), NAFL (nonalcoholic fatty liver) (63/89/3734), OSA (obstructive sleep apnea) (02/24/2019), Pulmonary hypertension, Renal insufficiency, and RLS (restless legs syndrome) (04/27/2015).   She has a past surgical history that includes THIGH SURGERY; Shoulder surgery (Right); Breast reduction surgery; Eye surgery (Right); Hammer toe surgery; LEFT HEART CATH AND CORONARY ANGIOGRAPHY (N/A, 02/03/2018); and Reverse shoulder arthroplasty (Right, 08/19/2019).   Her  family history includes Alcohol abuse in her sister; Alzheimer's disease in her father; Diabetes in her brother, mother, and sister; Heart disease in her brother, mother, and sister; Mental illness in her brother; Stroke in her brother.She reports that she has never smoked. She has never used smokeless tobacco. She reports that she does not currently use alcohol. She reports that she does not use drugs.    ROS Review of Systems  Constitutional: Negative.   HENT: Negative.    Eyes:  Negative for visual disturbance.  Respiratory:  Negative for shortness of breath.   Cardiovascular:  Negative for chest pain.  Gastrointestinal:  Negative for abdominal pain.  Genitourinary:  Positive for flank pain (left).  Musculoskeletal:  Negative for arthralgias.    Objective:  BP (!) 153/83   Pulse 74   Temp (!) 97.1 F (36.2 C)   Ht 5' 2" (1.575 m)   Wt 193 lb 12.8 oz (87.9 kg)   SpO2 95%   BMI 35.45 kg/m   BP Readings from Last 3 Encounters:  03/26/22 (!) 153/83  03/11/22 127/82  03/04/22 125/80    Wt Readings from Last 3 Encounters:  03/26/22 193 lb 12.8 oz (87.9 kg)  03/11/22 194 lb 8 oz (88.2 kg)  03/04/22 188 lb 9.6 oz (85.5 kg)     Physical Exam Constitutional:      General: She is not in acute distress.    Appearance: She is well-developed.  Cardiovascular:     Rate and Rhythm: Normal rate and regular rhythm.  Pulmonary:     Breath sounds: Normal breath sounds.  Musculoskeletal:        General: Tenderness (moderate at left flank) present. Normal range of motion.  Skin:    General: Skin is warm and dry.  Neurological:     Mental Status: She is alert and oriented to person, place, and time.  Assessment & Plan:   Amber Stephenson was seen today for dysuria.  Diagnoses and all orders for this visit:  Spasmodic bladder -     solifenacin (VESICARE) 5 MG tablet; Take 1 tablet (5 mg total) by mouth daily. For bladder control  Dysuria -     Urinalysis, Complete -      Urine Culture -     Microscopic Examination  Need for influenza vaccination -     Flu Vaccine QUAD High Dose(Fluad)  Left upper quadrant abdominal pain -     CT ABDOMEN PELVIS W CONTRAST; Future  Contrast media allergy -     predniSONE (DELTASONE) 50 MG tablet; TAke one 13 hours before procedure. The second 7 hours before your procedure and the third one hour before your procedure.       I am having Amber Stephenson. Amber Stephenson start on predniSONE and solifenacin. I am also having her maintain her NON FORMULARY, Vitamin D (Cholecalciferol), albuterol, albuterol, Fluticasone-Salmeterol, LORazepam, Prolia, carbamazepine, furosemide, alendronate, Dexcom G6 Transmitter, atorvastatin, naloxone, Lantus SoloStar, insulin lispro, Rybelsus, metFORMIN, pregabalin, doxepin, escitalopram, ARIPiprazole, B-D UF III MINI PEN NEEDLES, Emgality, metoprolol succinate, metoprolol succinate, nystatin, Pen Needles, benztropine, icosapent Ethyl, nystatin ointment, ondansetron, lisinopril, potassium chloride SA, HYDROmorphone, levofloxacin, cephALEXin, metaxalone, polyethylene glycol powder, fluconazole, and Eliquis.  Allergies as of 03/26/2022       Reactions   Ivp Dye [iodinated Contrast Media] Anaphylaxis, Swelling   Throat closes   Codeine Other (See Comments)   Iodine Other (See Comments)   Latex    Latex tape pulls skin with it   Tizanidine Other (See Comments)   Weakness, goofy, bad dreams        Medication List        Accurate as of March 26, 2022  8:46 PM. If you have any questions, ask your nurse or doctor.          albuterol (2.5 MG/3ML) 0.083% nebulizer solution Commonly known as: PROVENTIL Take 3 mLs (2.5 mg total) by nebulization every 6 (six) hours as needed for wheezing or shortness of breath.   albuterol 108 (90 Base) MCG/ACT inhaler Commonly known as: VENTOLIN HFA Inhale 2 puffs into the lungs every 6 (six) hours as needed for wheezing or shortness of breath.   alendronate 70  MG tablet Commonly known as: FOSAMAX Take 1 tablet (70 mg total) by mouth every 7 (seven) days. Take with a full glass of water on an empty stomach. Do not lay down for at least 2 hours   ARIPiprazole 2 MG tablet Commonly known as: ABILIFY Take 2 mg by mouth at bedtime.   atorvastatin 40 MG tablet Commonly known as: LIPITOR TAKE 1 TABLET DAILY   B-D UF III MINI PEN NEEDLES 31G X 5 MM Misc Generic drug: Insulin Pen Needle Inject into the skin.   Pen Needles 30G X 5 MM Misc Use to inject insulin 5 times daily   benztropine 1 MG tablet Commonly known as: COGENTIN Take 1 tablet (1 mg total) by mouth 2 (two) times daily.   carbamazepine 100 MG 12 hr capsule Commonly known as: CARBATROL Take by mouth at bedtime.   cephALEXin 500 MG capsule Commonly known as: Keflex Take 1 capsule (500 mg total) by mouth 2 (two) times daily.   Dexcom G6 Transmitter Misc 1 Device by Does not apply route as directed.   doxepin 10 MG capsule Commonly known as: SINEQUAN Take 10 mg by mouth at bedtime.   Eliquis 5 MG Tabs tablet Generic  drug: apixaban TAKE 1 TABLET(5 MG) BY MOUTH TWICE DAILY   Emgality 120 MG/ML Soaj Generic drug: Galcanezumab-gnlm INJECT INTO THE SKIN ONCE MONTHLY   escitalopram 10 MG tablet Commonly known as: LEXAPRO Take 10 mg by mouth daily.   fluconazole 100 MG tablet Commonly known as: Diflucan Take 1 daily for 10 days, DO NOT TAKE LIPITOR while taking diflucan   Fluticasone-Salmeterol 500-50 MCG/DOSE Aepb Commonly known as: Advair Diskus Inhale 1 puff into the lungs in the morning and at bedtime.   furosemide 40 MG tablet Commonly known as: LASIX Take 1 tablet (40 mg total) by mouth every morning. TAKE 1 TABLET EVERY MORNING   HYDROmorphone 2 MG tablet Commonly known as: Dilaudid Take 1 tablet (2 mg total) by mouth every 6 (six) hours as needed for severe pain.   icosapent Ethyl 1 g capsule Commonly known as: VASCEPA TAKE 2 CAPSULES TWICE A DAY WITH  MEALS   insulin lispro 100 UNIT/ML KwikPen Commonly known as: HumaLOG KwikPen Max Daily 50 units   Lantus SoloStar 100 UNIT/ML Solostar Pen Generic drug: insulin glargine Inject 38 Units into the skin 2 (two) times daily.   levofloxacin 500 MG tablet Commonly known as: LEVAQUIN Take 1 tablet (500 mg total) by mouth daily. For 10 days   lisinopril 10 MG tablet Commonly known as: ZESTRIL Take 1 tablet (10 mg total) by mouth daily.   LORazepam 1 MG tablet Commonly known as: ATIVAN Take 1 tablet (1 mg total) by mouth 2 (two) times daily. And give 2 tablets by mouth at bedtime   metaxalone 800 MG tablet Commonly known as: SKELAXIN Take 1 tablet (800 mg total) by mouth 3 (three) times daily as needed for muscle spasms.   metFORMIN 500 MG tablet Commonly known as: GLUCOPHAGE Take 1 tablet (500 mg total) by mouth 2 (two) times daily with a meal. TAKE 1 TABLET(500 MG) BY MOUTH TWICE DAILY AFTER A MEAL   metoprolol succinate 25 MG 24 hr tablet Commonly known as: Toprol XL Take 1 tablet (25 mg total) by mouth in the morning and at bedtime.   metoprolol succinate 100 MG 24 hr tablet Commonly known as: TOPROL-XL TAKE 1 TABLET IN THE MORNING AND AT BEDTIME   naloxone 4 MG/0.1ML Liqd nasal spray kit Commonly known as: NARCAN SMARTSIG:Both Nares   NON FORMULARY Diet: _____ Regular, ___  __ NAS, ___x____Consistent Carbohydrate, _______NPO _____Other   nystatin powder Commonly known as: nystatin Apply 1 Application topically 3 (three) times daily.   nystatin ointment Commonly known as: MYCOSTATIN Apply 1 Application topically 2 (two) times daily.   ondansetron 8 MG disintegrating tablet Commonly known as: ZOFRAN-ODT Take 1 tablet (8 mg total) by mouth every 6 (six) hours as needed for nausea or vomiting.   polyethylene glycol powder 17 GM/SCOOP powder Commonly known as: GLYCOLAX/MIRALAX Take by mouth as directed.   potassium chloride SA 20 MEQ tablet Commonly known as:  KLOR-CON M Take 1 tablet (20 mEq total) by mouth daily.   predniSONE 50 MG tablet Commonly known as: DELTASONE TAke one 13 hours before procedure. The second 7 hours before your procedure and the third one hour before your procedure. Started by: [name], MD   pregabalin 200 MG capsule Commonly known as: LYRICA TAKE 1 CAPSULE(200 MG) BY MOUTH TWICE DAILY   Prolia 60 MG/ML Sosy injection Generic drug: denosumab Inject 60 mg into the skin every 6 (six) months.   Rybelsus 14 MG Tabs Generic drug: Semaglutide Take 1 tablet (14 mg  total) by mouth daily.   solifenacin 5 MG tablet Commonly known as: VESIcare Take 1 tablet (5 mg total) by mouth daily. For bladder control Started by: [name], MD   Vitamin D (Cholecalciferol) 25 MCG (1000 UT) Tabs Take 1 tablet by mouth daily in the afternoon.         Follow-up: Return in about 3 months (around 06/26/2022).  [name], M.D.

## 2022-03-27 ENCOUNTER — Encounter (HOSPITAL_COMMUNITY): Payer: Medicare Other | Admitting: Physical Therapy

## 2022-03-27 ENCOUNTER — Telehealth: Payer: Self-pay | Admitting: Family Medicine

## 2022-03-27 ENCOUNTER — Telehealth: Payer: Self-pay | Admitting: *Deleted

## 2022-03-27 ENCOUNTER — Other Ambulatory Visit: Payer: Self-pay | Admitting: Physical Medicine and Rehabilitation

## 2022-03-27 NOTE — Telephone Encounter (Signed)
REFERRAL REQUEST Telephone Note  Have you been seen at our office for this problem? yes (Advise that they may need an appointment with their PCP before a referral can be done)  Reason for Referral: scan? Referral discussed with patient: yes  Best contact number of patient for referral team: [phone]    Has patient been seen by a specialist for this issue before: not sure  Patient provider preference for referral: no Patient location preference for referral: not sure?   Patient notified that referrals can take up to a week or longer to process. If they haven't heard anything within a week they should call back and speak with the referral department.

## 2022-03-27 NOTE — Telephone Encounter (Signed)
KeyEverrett Coombe - PA Case ID: [id number] - Rx #: [id number]  Drug Solifenacin Succinate '5MG'$  tablets Form [employer] Electronic PA Form (2017 NCPDP) Original Claim Info 75 Must try Detrol LA, Oxybutynin IR, Oxybutynin ER, or Trospium IR first. Prescriber may call [employer]SI for override if not appropriate.CALL HELP DESK  Approvedtoday CaseId:[id number];Status:Approved;Review Type:Prior Auth;Coverage Start Date:02/25/2022;Coverage End Date:06/01/2098;

## 2022-03-28 ENCOUNTER — Ambulatory Visit: Payer: Medicare Other | Admitting: Physical Medicine and Rehabilitation

## 2022-03-28 LAB — URINE CULTURE

## 2022-03-28 NOTE — Progress Notes (Signed)
Hello Rushie,  Your lab result is normal and/or stable.Some minor variations that are not significant are commonly marked abnormal, but do not represent any medical problem for you.  Best regards, [name], M.D.

## 2022-03-31 ENCOUNTER — Encounter: Payer: Self-pay | Admitting: Family

## 2022-03-31 ENCOUNTER — Ambulatory Visit (INDEPENDENT_AMBULATORY_CARE_PROVIDER_SITE_OTHER): Payer: Medicare Other | Admitting: Family

## 2022-03-31 ENCOUNTER — Telehealth: Payer: Medicare Other

## 2022-03-31 VITALS — BP 117/75 | HR 75 | Temp 97.1°F | Ht 62.0 in | Wt 194.8 lb

## 2022-03-31 DIAGNOSIS — H9211 Otorrhea, right ear: Secondary | ICD-10-CM

## 2022-03-31 DIAGNOSIS — L299 Pruritus, unspecified: Secondary | ICD-10-CM

## 2022-03-31 DIAGNOSIS — I2510 Atherosclerotic heart disease of native coronary artery without angina pectoris: Secondary | ICD-10-CM | POA: Diagnosis not present

## 2022-03-31 MED ORDER — HYDROXYZINE PAMOATE 25 MG PO CAPS: 25.0000 mg | ORAL_CAPSULE | Freq: Three times a day (TID) | ORAL | 1 refills | Status: DC | PRN

## 2022-03-31 MED ORDER — METHYLPREDNISOLONE ACETATE 40 MG/ML IJ SUSP
40.0000 mg | Freq: Once | INTRAMUSCULAR | Status: AC
  Administered 2022-03-31: 40 mg via INTRAMUSCULAR

## 2022-03-31 NOTE — Progress Notes (Signed)
Subjective:    Patient ID: Amber Stephenson, female    DOB: 1946-01-20, 76 y.o.   MRN: [id number]  Chief Complaint  Patient presents with   Ear Pain    Blood came out yesterday a little pain today    itching all over     1 mth wants something called it    PT presents to the office today with ear pain that started yesterday and noticed a bloody discharge. The pain has almost resolved now.   She is also complaining of pruritis that started over a month ago on and off, but the last two days has worsen. Denies any changes in mediations, detergents, soaps, or lotions. She has take benadryl as needed at home.  Otalgia  There is pain in the right ear. This is a new problem. The current episode started yesterday. The problem has been resolved. There has been no fever. The pain is at a severity of 2/10. The pain is mild. Pertinent negatives include no coughing, diarrhea, headaches, hearing loss, neck pain, rhinorrhea, sore throat or vomiting. She has tried nothing for the symptoms. The treatment provided significant relief.      Review of Systems  HENT:  Positive for ear pain. Negative for hearing loss, rhinorrhea and sore throat.   Respiratory:  Negative for cough.   Gastrointestinal:  Negative for diarrhea and vomiting.  Musculoskeletal:  Negative for neck pain.  Neurological:  Negative for headaches.  All other systems reviewed and are negative.      Objective:   Physical Exam Vitals reviewed.  Constitutional:      General: She is not in acute distress.    Appearance: She is well-developed. She is obese.  HENT:     Head: Normocephalic and atraumatic.     Right Ear: Tympanic membrane normal.     Left Ear: Tympanic membrane normal.     Ears:     Comments: Right canal with dried blood, no swelling or infection noted. TM normal  Eyes:     Pupils: Pupils are equal, round, and reactive to light.  Neck:     Thyroid: No thyromegaly.  Cardiovascular:     Rate and Rhythm: Normal rate  and regular rhythm.     Heart sounds: Normal heart sounds. No murmur heard. Pulmonary:     Effort: Pulmonary effort is normal. No respiratory distress.     Breath sounds: Normal breath sounds. No wheezing.  Abdominal:     General: Bowel sounds are normal. There is no distension.     Palpations: Abdomen is soft.     Tenderness: There is no abdominal tenderness.  Musculoskeletal:        General: No tenderness. Normal range of motion.     Cervical back: Normal range of motion and neck supple.  Skin:    General: Skin is warm and dry.  Neurological:     Mental Status: She is alert and oriented to person, place, and time.     Cranial Nerves: No cranial nerve deficit.     Deep Tendon Reflexes: Reflexes are normal and symmetric.  Psychiatric:        Behavior: Behavior normal.        Thought Content: Thought content normal.        Judgment: Judgment normal.       Temp (!) 97.1 F (36.2 C) (Temporal)   Ht '5\' 2"'$  (1.575 m)   Wt 194 lb 12.8 oz (88.4 kg)   BMI 35.63 kg/m  Assessment & Plan:   [name] comes in today with chief complaint of Ear Pain (Blood came out yesterday a little pain today ) and itching all over  (1 mth wants something called it )   Diagnosis and orders addressed:  1. Pruritus Avoid hot showers Apply thick moisturizers after shower  Avoid scratching  Referral to derm placed  - hydrOXYzine (VISTARIL) 25 MG capsule; Take 1 capsule (25 mg total) by mouth every 8 (eight) hours as needed.  Dispense: 90 capsule; Refill: 1 - methylPREDNISolone acetate (DEPO-MEDROL) injection 40 mg - Ambulatory referral to Dermatology  2. Ear discharge of right ear Avoid sticking anything into ear  Evelina Dun, FNP

## 2022-03-31 NOTE — Patient Instructions (Signed)
SARNA LOTION  Pruritus Pruritus is an itchy feeling on the skin. One of the most common causes is dry skin, but many different things can cause itching. Most cases of itching do not require medical attention. Sometimes itchy skin can turn into a rash or a secondary infection. Follow these instructions at home: Skin care  Do not use scented soaps, detergents, perfumes, and cosmetic products. Instead, use gentle, unscented versions of these items. Apply moisturizing creams to your skin frequently, at least twice daily. Apply immediately after bathing while skin is still wet. Take medicines or apply medicated creams only as told by your health care provider. This may include: Corticosteroid cream or topical calcineurin inhibitor. Anti-itch lotions containing urea, camphor, or menthol. Oral antihistamines. Do not take hot showers or baths, which can make itching worse. A short, cool shower may help with itching as long as you apply moisturizing lotion after the shower. Apply a cool, wet cloth (cool compress) to the affected areas. You may take lukewarm baths with one of the following: Epsom salts. You can get these at your local pharmacy or grocery store. Follow the instructions on the packaging. Baking soda. Pour a small amount into the bath as told by your health care provider. Colloidal oatmeal. You can get this at your local pharmacy or grocery store. Follow the instructions on the packaging. Do not scratch your skin. General instructions Avoid wearing tight clothes. Keep a journal to help find out what is causing your itching. Write down: What you eat and drink. What cosmetic products you use. What soaps or detergents you use. What you wear, including jewelry. Use a humidifier. This keeps the air moist, which helps to prevent dry skin. Be aware of any changes in your itchiness. Tell your health care provider about any changes. Contact a health care provider if: The itching does not go  away after several days. You notice redness, warmth, or drainage on the skin where you have scratched. You are unusually thirsty or urinating more than normal. Your skin tingles or feels numb. Your skin or the white parts of your eyes turn yellow (jaundice). You feel weak. You have any of the following: Night sweats. Tiredness (fatigue). Weight loss. Abdominal pain. Summary Pruritus is an itchy feeling on the skin. One of the most common causes is dry skin, but many different conditions and factors can cause itching. Apply moisturizing creams to your skin frequently, at least twice daily. Apply immediately after bathing while skin is still wet. Take medicines or apply medicated creams only as told by your health care provider. Do not take hot showers or baths. Do not use scented soaps, detergents, perfumes, or cosmetic products. Keep a journal to help find out what is causing your itching. This information is not intended to replace advice given to you by your health care provider. Make sure you discuss any questions you have with your health care provider. Document Revised: 06/26/2021 Document Reviewed: 06/26/2021 Elsevier Patient Education  2023 Elsevier Inc.  

## 2022-04-01 ENCOUNTER — Telehealth: Payer: Self-pay | Admitting: Family Medicine

## 2022-04-01 NOTE — Telephone Encounter (Signed)
Appt made

## 2022-04-03 ENCOUNTER — Other Ambulatory Visit: Payer: Self-pay | Admitting: Internal Medicine

## 2022-04-03 ENCOUNTER — Ambulatory Visit: Payer: Medicare Other | Admitting: Family Medicine

## 2022-04-03 ENCOUNTER — Telehealth: Payer: Self-pay | Admitting: Cardiology

## 2022-04-03 DIAGNOSIS — E0865 Diabetes mellitus due to underlying condition with hyperglycemia: Secondary | ICD-10-CM

## 2022-04-03 MED ORDER — FUROSEMIDE 40 MG PO TABS: 40.0000 mg | ORAL_TABLET | Freq: Every morning | ORAL | 11 refills | Status: DC

## 2022-04-03 MED ORDER — FUROSEMIDE 40 MG PO TABS: 40.0000 mg | ORAL_TABLET | Freq: Every morning | ORAL | 3 refills | Status: DC

## 2022-04-03 NOTE — Telephone Encounter (Signed)
*  STAT* If patient is at the pharmacy, call can be transferred to refill team.   1. Which medications need to be refilled? (please list name of each medication and dose if known) furosemide (LASIX) 40 MG tablet   2. Which pharmacy/location (including street and city if local pharmacy) is medication to be sent to? [facility], [place]   3. Do they need a 30 day or 90 day supply? 90 day

## 2022-04-03 NOTE — Addendum Note (Signed)
Addended by: Carter Kitten D on: 04/03/2022 08:30 AM   Modules accepted: Orders

## 2022-04-03 NOTE — Telephone Encounter (Signed)
Pt's medication was sent to pt's pharmacy as requested. Confirmation received.  °

## 2022-04-03 NOTE — Telephone Encounter (Signed)
Changed to [place]

## 2022-04-04 ENCOUNTER — Telehealth: Payer: Self-pay | Admitting: Family Medicine

## 2022-04-04 NOTE — Telephone Encounter (Signed)
Pt called requesting to be seen by Dr Livia Snellen ASAP (11/6 or 11/7). Says her ear is bleeding off and on and she is itching all over.

## 2022-04-04 NOTE — Telephone Encounter (Signed)
Appt scheduled for 4:10 on 11/7

## 2022-04-07 ENCOUNTER — Other Ambulatory Visit: Payer: Self-pay | Admitting: Family Medicine

## 2022-04-07 ENCOUNTER — Telehealth: Payer: Self-pay | Admitting: Physical Medicine and Rehabilitation

## 2022-04-07 DIAGNOSIS — E1169 Type 2 diabetes mellitus with other specified complication: Secondary | ICD-10-CM

## 2022-04-07 DIAGNOSIS — L308 Other specified dermatitis: Secondary | ICD-10-CM | POA: Diagnosis not present

## 2022-04-07 DIAGNOSIS — Z79899 Other long term (current) drug therapy: Secondary | ICD-10-CM | POA: Diagnosis not present

## 2022-04-07 DIAGNOSIS — L298 Other pruritus: Secondary | ICD-10-CM | POA: Diagnosis not present

## 2022-04-07 DIAGNOSIS — S60569A Insect bite (nonvenomous) of unspecified hand, initial encounter: Secondary | ICD-10-CM | POA: Diagnosis not present

## 2022-04-07 MED ORDER — HYDROMORPHONE HCL 2 MG PO TABS: 2.0000 mg | ORAL_TABLET | Freq: Four times a day (QID) | ORAL | 0 refills | Status: DC | PRN

## 2022-04-07 NOTE — Telephone Encounter (Signed)
Patient requesting refill for Dilaudid

## 2022-04-08 ENCOUNTER — Telehealth: Payer: Self-pay | Admitting: Cardiology

## 2022-04-08 ENCOUNTER — Ambulatory Visit: Payer: Medicare Other | Admitting: Family Medicine

## 2022-04-08 NOTE — Telephone Encounter (Signed)
Called pt to inform her that her medication was D/C by her PCP and that she needed to contact their office to inquire about the D/C of her medication. I advised the pt that if she has any other problems, questions or concerns, to give our office a call back. Pt verbalized understanding

## 2022-04-08 NOTE — Telephone Encounter (Signed)
*  STAT* If patient is at the pharmacy, call can be transferred to refill team.   1. Which medications need to be refilled? (please list name of each medication and dose if known) Diltiazem Garnizium 120 mg 1x daily   2. Which pharmacy/location (including street and city if local pharmacy) is medication to be sent to? [facility], [place]    3. Do they need a 30 day or 90 day supply? [place]

## 2022-04-09 ENCOUNTER — Encounter: Payer: Self-pay | Admitting: Family Medicine

## 2022-04-11 NOTE — Telephone Encounter (Signed)
Patient states that she woke up this morning feeling like her whole right side was "asleep" had no feeling in arms or legs, has been that way for about two hours.  Now she is only feeling like that in her arm and hand.  I advised the patient to call 911 for transport to emergency room for possible stroke.

## 2022-04-15 ENCOUNTER — Other Ambulatory Visit (HOSPITAL_COMMUNITY): Payer: Self-pay

## 2022-04-15 ENCOUNTER — Other Ambulatory Visit: Payer: Self-pay

## 2022-04-15 ENCOUNTER — Telehealth: Payer: Self-pay

## 2022-04-15 MED ORDER — DEXCOM G6 SENSOR MISC: 6 refills | Status: DC

## 2022-04-15 NOTE — Telephone Encounter (Signed)
Patient Advocate Encounter   Received notification from [facility] that prior authorization for Dexcom G6 is required.   PA submitted on 04/15/2022 Key [id number] Status is pending       Joneen Boers, [place] Patient Advocate Specialist [place] Patient Advocate Team Direct Number: [phone] Fax: [phone]

## 2022-04-16 NOTE — Telephone Encounter (Signed)
Pharmacy Patient Advocate Encounter  Prior Authorization for Dexcom G6 has been approved.    PA# [id number] Effective dates: 03/16/2022 through 04/16/2023

## 2022-04-17 ENCOUNTER — Ambulatory Visit (INDEPENDENT_AMBULATORY_CARE_PROVIDER_SITE_OTHER): Payer: Medicare Other | Admitting: Adult Health

## 2022-04-17 ENCOUNTER — Encounter: Payer: Self-pay | Admitting: Adult Health

## 2022-04-17 VITALS — BP 163/107 | HR 79 | Ht 62.0 in | Wt 190.5 lb

## 2022-04-17 DIAGNOSIS — I2510 Atherosclerotic heart disease of native coronary artery without angina pectoris: Secondary | ICD-10-CM | POA: Diagnosis not present

## 2022-04-17 DIAGNOSIS — B369 Superficial mycosis, unspecified: Secondary | ICD-10-CM

## 2022-04-17 DIAGNOSIS — N898 Other specified noninflammatory disorders of vagina: Secondary | ICD-10-CM

## 2022-04-17 MED ORDER — FLUCONAZOLE 150 MG PO TABS: ORAL_TABLET | ORAL | 1 refills | Status: DC

## 2022-04-17 NOTE — Progress Notes (Signed)
Subjective:     Patient ID: Amber Stephenson, female   DOB: 06-08-1945, 76 y.o.   MRN: [id number]  HPI Amber Stephenson is a 76 year old white female,married, PM in complaining of having had itching all over, and has seen PCP was given cream, vistaril and depo medrol, did not help, has vaginal itching and bumps in groin area.  PCP is Dr Livia Snellen  Review of Systems Has had itching all over +vaginal itching and bumps in groin area Reviewed past medical,surgical, social and family history. Reviewed medications and allergies.     Objective:   Physical Exam BP (!) 163/107 (BP Location: Left Arm, Patient Position: Sitting, Cuff Size: Normal)   Pulse 79   Ht '5\' 2"'$  (1.575 m)   Wt 190 lb 8 oz (86.4 kg)   BMI 34.84 kg/m     Skin warm and dry.Pelvic: external genitalia is normal in appearance, mildly red vulva and left groin area, vagina: pale,urethra has no lesions or masses noted, cervix:smooth, uterus: normal size, shape and contour, non tender, no masses felt, adnexa: no masses or tenderness noted. Bladder is non tender and no masses felt.painted left groin,vulva and vagina with gentian violet  Fall risk is low    04/17/2022    2:29 PM 03/31/2022   11:35 AM 03/26/2022    8:09 AM  Depression screen PHQ 2/9  Decreased Interest 0 0 0  Down, Depressed, Hopeless 0 0 0  PHQ - 2 Score 0 0 0  Altered sleeping 3 0   Tired, decreased energy 3 0   Change in appetite 0 0   Feeling bad or failure about yourself  1 0   Trouble concentrating 3 0   Moving slowly or fidgety/restless 1 0   Suicidal thoughts 0 0   PHQ-9 Score 11 0   Difficult doing work/chores  Not difficult at all    On meds    04/17/2022    2:31 PM 03/31/2022   11:35 AM 12/06/2021    9:36 AM 06/18/2021    3:37 PM  GAD 7 : Generalized Anxiety Score  Nervous, Anxious, on Edge 3 0 0 0  Control/stop worrying 3 0 0 0  Worry too much - different things 3 0 0 0  Trouble relaxing 1 0 0 0  Restless 0 0 0 0  Easily annoyed or irritable 3 0 0  0  Afraid - awful might happen 0 0 0 0  Total GAD 7 Score 13 0 0 0  Anxiety Difficulty  Not difficult at all Not difficult at all       Upstream - 04/17/22 1432       Pregnancy Intention Screening   Does the patient want to become pregnant in the next year? N/A    Does the patient's partner want to become pregnant in the next year? N/A    Would the patient like to discuss contraceptive options today? N/A      Contraception Wrap Up   Current Method No Method - Other Reason   post menopausal   End Method No Method - Other Reason   post menopausal   Contraception Counseling Provided No            Examination chaperoned by Dewitt Hoes RN  Assessment:     1. Vaginal itching Painted with gentian violet and rx given for diflucan   2. Superficial fungus infection of skin Pained with gentian violet Will rx diflucan   Meds ordered this encounter  Medications  fluconazole (DIFLUCAN) 150 MG tablet    Sig: Take 1 now and 1 in 3 days DO NOT TAKE LIPITOR WHEN TAKING    Dispense:  2 tablet    Refill:  1    Order Specific Question:   Supervising Provider    Answer:   Florian Buff [[id number]]       Plan:     Follow up prn

## 2022-04-19 NOTE — Progress Notes (Unsigned)
Office Visit    Patient Name: Amber Stephenson Date of Encounter: 04/21/2022  Primary Care Provider:  [name], MD Primary Cardiologist:  None Primary Electrophysiologist: None  Chief Complaint    Amber Stephenson is a 76 y.o. female with PMH of HTN, permanent AF (on Eliquis), HFpEF, HLD, DMII, asthma, bipolar disorder, CAD nonobstructive by [facility] 01/2018, stage III CKD, OSA (intolerant to CPAP), who presents today for 27-monthfollow-up of AF and CHF.   Past Medical History    Past Medical History:  Diagnosis Date   Acute exacerbation of CHF (congestive heart failure) ([facility] 12/13/2020   Acute on chronic diastolic CHF (congestive heart failure)/HFpEF Exacerbation 12/16/2020   Acute on chronic respiratory failure with hypoxia ([facility] 01/22/2019   Acute respiratory failure with hypoxia ([facility] 12/16/2020   Asthma    Atrial fibrillation with RVR 05/19/2017   Bipolar I disorder 01/23/2015   CAD (coronary artery disease)    Nonobstructive; Managed by Amber Stephenson  Cardiomegaly 01/12/2018   Chronic anticoagulation 05/30/2015   Chronic atrial fibrillation ([facility] 01/04/2015   Chronic back pain    Lower back   Chronic diastolic CHF (congestive heart failure) 05/30/2015   Diabetes mellitus, type 2, without complication    Diabetic neuropathy 02/06/2016   Dysrhythmia    A-Fib   Essential hypertension    Herpes genitalis in women 07/16/2015   Hyperlipidemia    Hypoxia 02/01/2019   Insomnia 01/23/2015   Lipoma 02/08/2015   Major depressive disorder    Migraine headache with aura 02/12/2016   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   NAFL (nonalcoholic fatty liver) 063/87/5643  OSA (obstructive sleep apnea) 02/24/2019   10/09/2018 - HST  - AHI 40.6    Pulmonary hypertension    Renal insufficiency    Managed by Dr. SWallace Keller  RLS (restless legs syndrome) 04/27/2015   Past Surgical History:  Procedure Laterality Date   BREAST REDUCTION SURGERY     EYE SURGERY Right     cateracts   HAMMER TOE SURGERY     LEFT HEART CATH AND CORONARY ANGIOGRAPHY N/A 02/03/2018   Procedure: LEFT HEART CATH AND CORONARY ANGIOGRAPHY;  Surgeon: JMartinique Peter M, MD;  [facility];  Service: Cardiovascular;  Laterality: N/A;   REVERSE SHOULDER ARTHROPLASTY Right 08/19/2019   Procedure: REVERSE SHOULDER ARTHROPLASTY;  Surgeon: NNetta Cedars MD;  Location: [facility];  Service: Orthopedics;  Laterality: Right;  interscalene block   SHOULDER SURGERY Right    "I BROKE MY SHOUDLER   THIGH SURGERY     "TO REMOVE A TUMOR "    Allergies  Allergies  Allergen Reactions   Ivp Dye [Iodinated Contrast Media] Anaphylaxis and Swelling    Throat closes    Codeine Other (See Comments)   Iodine Other (See Comments)   Latex     Latex tape pulls skin with it   Tizanidine Other (See Comments)    Weakness, goofy, bad dreams    History of Present Illness    Amber Stephenson is a 89year old female with the above mention past medical history who presents today for follow-up of CHF and atrial fibrillation.  Amber Stephenson initially seen by Amber Stephenson in 2016 for complaint of shortness of breath and recent admission of pneumonia. She is also being managed for permanent atrial fibrillation. She transitioned to Dr. KBronson Ingin 2016 and is currently being by Amber Stephenson She underwent low risks nuclear stress test 06/2015.  She  completed high-resolution CT of the chest that revealed cardiomegaly and coronary atherosclerosis. Patient had [place] completed 2019 due to exertional dyspnea that demonstrated nonobstructive coronary artery disease with a proximal 40% LAD stenosis in mid LAD 30% and elevated LV EDP.  2D echo was completed 01/22/2019 demonstrated normal LV systolic function, EF 55 to 60%. There was mild aortic regurgitation.    She was last seen by Amber Stephenson on 07/2021 for follow-up.  During visit patient was doing well however was recovering from the flu and endorsed dizziness with  standing.  2D echo was completed 11/2020 with EF of 65-70%, no RWMA, moderate LVH, severely dilated LA/RA with trivial MV regurgitation.  Patient's AF was well controlled on examination and was tolerating this new medications with no side effects.   Amber Stephenson presents today for follow-up with her husband.  Since last being seen in the office patient reports that she has been doing well with no new cardiac complaints.  Her blood pressure today was well controlled at 110/70 and heart rate was 88 bpm.  She reports compliance with her current medication regimen and denies any adverse reactions.  She is euvolemic on exam and reports no sodium indiscretions. Patient denies chest pain, palpitations, dyspnea, PND, orthopnea, nausea, vomiting, dizziness, syncope, edema, weight gain, or early satiety.  Home Medications    Current Outpatient Medications  Medication Sig Dispense Refill   albuterol (PROVENTIL) (2.5 MG/3ML) 0.083% nebulizer solution Take 3 mLs (2.5 mg total) by nebulization every 6 (six) hours as needed for wheezing or shortness of breath. 150 mL 0   albuterol (VENTOLIN HFA) 108 (90 Base) MCG/ACT inhaler Inhale 2 puffs into the lungs every 6 (six) hours as needed for wheezing or shortness of breath. 1 each 0   alendronate (FOSAMAX) 70 MG tablet Take 1 tablet (70 mg total) by mouth every 7 (seven) days. Take with a full glass of water on an empty stomach. Do not lay down for at least 2 hours 4 tablet 11   ARIPiprazole (ABILIFY) 2 MG tablet Take 2 mg by mouth at bedtime.     atorvastatin (LIPITOR) 40 MG tablet TAKE 1 TABLET(40 MG) BY MOUTH DAILY 90 tablet 0   B-D UF III MINI PEN NEEDLES 31G X 5 MM MISC Inject into the skin.     benztropine (COGENTIN) 1 MG tablet Take 1 tablet (1 mg total) by mouth 2 (two) times daily. 60 tablet 2   carbamazepine (CARBATROL) 100 MG 12 hr capsule Take by mouth at bedtime.     Continuous Blood Gluc Sensor (DEXCOM G6 SENSOR) MISC Change every 10 days 3 each 6    Continuous Blood Gluc Transmit (DEXCOM G6 TRANSMITTER) MISC 1 Device by Does not apply route as directed. 1 each 3   denosumab (PROLIA) 60 MG/ML SOSY injection Inject 60 mg into the skin every 6 (six) months. 1 mL 1   doxepin (SINEQUAN) 10 MG capsule Take 10 mg by mouth at bedtime.     ELIQUIS 5 MG TABS tablet TAKE 1 TABLET(5 MG) BY MOUTH TWICE DAILY 60 tablet 2   escitalopram (LEXAPRO) 10 MG tablet Take 10 mg by mouth daily.     fluconazole (DIFLUCAN) 150 MG tablet Take 1 now and 1 in 3 days DO NOT TAKE LIPITOR WHEN TAKING 2 tablet 1   Fluticasone-Salmeterol (ADVAIR DISKUS) 500-50 MCG/DOSE AEPB Inhale 1 puff into the lungs in the morning and at bedtime. 60 each 0   furosemide (LASIX) 40 MG tablet Take 1 tablet (  40 mg total) by mouth every morning. 90 tablet 3   Galcanezumab-gnlm (EMGALITY) 120 MG/ML SOAJ INJECT INTO THE SKIN ONCE MONTHLY 1 mL 1   HYDROmorphone (DILAUDID) 2 MG tablet Take 1 tablet (2 mg total) by mouth every 6 (six) hours as needed for severe pain. 120 tablet 0   hydrOXYzine (VISTARIL) 25 MG capsule Take 1 capsule (25 mg total) by mouth every 8 (eight) hours as needed. 90 capsule 1   icosapent Ethyl (VASCEPA) 1 g capsule TAKE 2 CAPSULES TWICE A DAY WITH MEALS 360 capsule 1   insulin glargine (LANTUS SOLOSTAR) 100 UNIT/ML Solostar Pen Inject 38 Units into the skin 2 (two) times daily. 45 mL 6   insulin lispro (HUMALOG KWIKPEN) 100 UNIT/ML KwikPen Max Daily 50 units 45 mL 3   Insulin Pen Needle (PEN NEEDLES) 30G X 5 MM MISC Use to inject insulin 5 times daily 100 each 2   lisinopril (ZESTRIL) 10 MG tablet Take 1 tablet (10 mg total) by mouth daily. 90 tablet 3   LORazepam (ATIVAN) 1 MG tablet Take 1 tablet (1 mg total) by mouth 2 (two) times daily. And give 2 tablets by mouth at bedtime 90 tablet 0   metaxalone (SKELAXIN) 800 MG tablet Take 1 tablet (800 mg total) by mouth 3 (three) times daily as needed for muscle spasms. 90 tablet 0   metFORMIN (GLUCOPHAGE) 500 MG tablet Take 1  tablet (500 mg total) by mouth 2 (two) times daily with a meal. TAKE 1 TABLET(500 MG) BY MOUTH TWICE DAILY AFTER A MEAL 180 tablet 3   metoprolol succinate (TOPROL XL) 25 MG 24 hr tablet Take 1 tablet (25 mg total) by mouth in the morning and at bedtime. 180 tablet 3   metoprolol succinate (TOPROL-XL) 100 MG 24 hr tablet TAKE 1 TABLET IN THE MORNING AND AT BEDTIME 180 tablet 3   naloxone (NARCAN) nasal spray 4 mg/0.1 mL SMARTSIG:Both Nares     NON FORMULARY Diet: _____ Regular, ___  __ NAS, ___x____Consistent Carbohydrate, _______NPO _____Other     nystatin ointment (MYCOSTATIN) Apply 1 Application topically 2 (two) times daily. 30 g 3   nystatin powder Apply 1 Application topically 3 (three) times daily. 15 g 0   ondansetron (ZOFRAN-ODT) 8 MG disintegrating tablet Take 1 tablet (8 mg total) by mouth every 6 (six) hours as needed for nausea or vomiting. 20 tablet 1   polyethylene glycol powder (GLYCOLAX/MIRALAX) 17 GM/SCOOP powder Take by mouth as directed.     potassium chloride SA (KLOR-CON M) 20 MEQ tablet Take 1 tablet (20 mEq total) by mouth daily. 30 tablet 5   predniSONE (DELTASONE) 50 MG tablet TAke one 13 hours before procedure. The second 7 hours before your procedure and the third one hour before your procedure. 3 tablet 0   pregabalin (LYRICA) 200 MG capsule TAKE 1 CAPSULE(200 MG) BY MOUTH TWICE DAILY 60 capsule 5   Semaglutide (RYBELSUS) 14 MG TABS Take 1 tablet (14 mg total) by mouth daily. 90 tablet 3   solifenacin (VESICARE) 5 MG tablet Take 1 tablet (5 mg total) by mouth daily. For bladder control 30 tablet 5   Vitamin D, Cholecalciferol, 25 MCG (1000 UT) TABS Take 1 tablet by mouth daily in the afternoon.     No current facility-administered medications for this visit.   Facility-Administered Medications Ordered in Other Visits  Medication Dose Route Frequency Provider Last Rate Last Admin   bupivacaine-epinephrine (MARCAINE W/ EPI) 0.5% -1:200000 injection    Anesthesia  Intra-op Effie Berkshire, MD   12 mL at 01/21/19 2841     Review of Systems  Please see the history of present illness.      All other systems reviewed and are otherwise negative except as noted above.  Physical Exam    Wt Readings from Last 3 Encounters:  04/21/22 193 lb (87.5 kg)  04/17/22 190 lb 8 oz (86.4 kg)  03/31/22 194 lb 12.8 oz (88.4 kg)   VS: Vitals:   04/21/22 1538  BP: 110/70  Pulse: 88  SpO2: 94%  ,Body mass index is 35.3 kg/m.  Constitutional:      Appearance: Healthy appearance. Not in distress.  Neck:     Vascular: JVD normal.  Pulmonary:     Effort: Pulmonary effort is normal.     Breath sounds: No wheezing. No rales. Diminished in the bases Cardiovascular:  Irregularly irregular normal S1. Normal S2.      Murmurs: There is no murmur.  Edema:    Peripheral edema absent.  Abdominal:     Palpations: Abdomen is soft non tender. There is no hepatomegaly.  Skin:    General: Skin is warm and dry.  Neurological:     General: No focal deficit present.     Mental Status: Alert and oriented to person, place and time.     Cranial Nerves: Cranial nerves are intact.  EKG/LABS/Other Studies Reviewed    ECG personally reviewed by me today -atrial fibrillation with rate of 88 bpm with no acute changes and TWI in leads aVL consistent with previous EKG.  Risk Assessment/Calculations:    CHA2DS2-VASc Score = 6   This indicates a 9.7% annual risk of stroke. The patient's score is based upon: CHF History: 1 HTN History: 1 Diabetes History: 1 Stroke History: 0 Vascular Disease History: 1 Age Score: 1 Gender Score: 1           Lab Results  Component Value Date   WBC 10.8 (H) 06/19/2021   HGB 15.6 (H) 06/19/2021   HCT 48.5 (H) 06/19/2021   MCV 86.6 06/19/2021   PLT 233.0 06/19/2021   Lab Results  Component Value Date   CREATININE 1.01 (H) 12/06/2021   BUN 17 12/06/2021   NA 143 12/06/2021   K 3.8 12/06/2021   CL 100 12/06/2021   CO2 24  12/06/2021   Lab Results  Component Value Date   ALT 9 06/19/2021   AST 16 06/19/2021   ALKPHOS 88 06/19/2021   BILITOT 0.4 06/19/2021   Lab Results  Component Value Date   CHOL 161 04/03/2021   HDL 41 04/03/2021   LDLCALC 70 04/03/2021   LDLDIRECT 62 03/17/2016   TRIG 311 (H) 04/03/2021   CHOLHDL 3.9 04/03/2021    Lab Results  Component Value Date   HGBA1C 7.3 (A) 11/22/2021    Assessment & Plan     1.  Chronic HFpEF: -2D echo completed EF of 65-70%, no RWMA, moderate LVH, severely dilated LA/RA with trivial MV regurgitation.  -Patient is euvolemic on examination today -Continue Lasix 40 mg, lisinopril 10 mg, Toprol XL 25 mg in a.m. and 100 mg in p.m. -Low sodium diet, fluid restriction <2L, and daily weights encouraged. Educated to contact our office for weight gain of 2 lbs overnight or 5 lbs in one week.    2.  Permanent AF: -Patient tolerating her current rate control medication with Toprol-XL as noted above -Continue Eliquis 5 mg twice daily -Patient dose appropriate based on creatinine of  0.67 and an hemoglobin of 15.0  -CHA2DS2-VASc Score = 6 [CHF History: 1, HTN History: 1, Diabetes History: 1, Stroke History: 0, Vascular Disease History: 1, Age Score: 1, Gender Score: 1].  Therefore, the patient's annual risk of stroke is 9.7 %.       3.  Hypertension: -Blood pressure today was well controlled at 124/74 -Continue lisinopril and Toprol XL as noted above   4.  Hyperlipidemia: -Patient's most recent LDL was well controlled at -Continue Lipitor 40 mg  Disposition: Follow-up with None or APP in 6 months    Medication Adjustments/Labs and Tests Ordered: Current medicines are reviewed at length with the patient today.  Concerns regarding medicines are outlined above.   Signed, Mable Fill, Marissa Nestle, NP 04/21/2022, 4:27 PM [facility]  Note:  This document was prepared using Dragon voice recognition software and may include  unintentional dictation errors.

## 2022-04-21 ENCOUNTER — Ambulatory Visit (HOSPITAL_BASED_OUTPATIENT_CLINIC_OR_DEPARTMENT_OTHER)
Admission: RE | Admit: 2022-04-21 | Disposition: A | Discharge status: Home / Self Care | Payer: Medicare Other | Source: Ambulatory Visit

## 2022-04-21 ENCOUNTER — Ambulatory Visit (HOSPITAL_BASED_OUTPATIENT_CLINIC_OR_DEPARTMENT_OTHER): Payer: Medicare Other | Attending: Family Medicine | Admitting: Nurse Practitioner

## 2022-04-21 ENCOUNTER — Encounter: Payer: Self-pay | Admitting: Nurse Practitioner

## 2022-04-21 VITALS — BP 110/70 | HR 88 | Ht 62.0 in | Wt 193.0 lb

## 2022-04-21 DIAGNOSIS — E782 Mixed hyperlipidemia: Secondary | ICD-10-CM | POA: Insufficient documentation

## 2022-04-21 DIAGNOSIS — I5032 Chronic diastolic (congestive) heart failure: Secondary | ICD-10-CM | POA: Diagnosis not present

## 2022-04-21 DIAGNOSIS — I482 Chronic atrial fibrillation, unspecified: Secondary | ICD-10-CM | POA: Insufficient documentation

## 2022-04-21 DIAGNOSIS — I10 Essential (primary) hypertension: Secondary | ICD-10-CM | POA: Insufficient documentation

## 2022-04-21 NOTE — Patient Instructions (Addendum)
Medication Instructions:  Your physician recommends that you continue on your current medications as directed. Please refer to the Current Medication list given to you today. *If you need a refill on your cardiac medications before your next appointment, please call your pharmacy*   Lab Work: None Ordered   Testing/Procedures: None Ordered   Follow-Up: At [facility], you and your health needs are our priority.  As part of our continuing mission to provide you with exceptional heart care, we have created designated Provider Care Teams.  These Care Teams include your primary Cardiologist (physician) and Advanced Practice Providers (APPs -  Physician Assistants and Nurse Practitioners) who all work together to provide you with the care you need, when you need it.  We recommend signing up for the patient portal called "MyChart".  Sign up information is provided on this After Visit Summary.  MyChart is used to connect with patients for Virtual Visits (Telemedicine).  Patients are able to view lab/test results, encounter notes, upcoming appointments, etc.  Non-urgent messages can be sent to your provider as well.   To learn more about what you can do with MyChart, go to [email or web address].    Your next appointment:   6 month(s)  The format for your next appointment:   In Person  Provider:   Sueanne Margarita, MD Other Instructions   Important Information About Sugar

## 2022-04-22 NOTE — Addendum Note (Signed)
Addended by: Janan Halter F on: 04/22/2022 02:59 PM   Modules accepted: Orders

## 2022-04-28 ENCOUNTER — Encounter: Payer: Self-pay | Admitting: Family Medicine

## 2022-04-28 ENCOUNTER — Ambulatory Visit (INDEPENDENT_AMBULATORY_CARE_PROVIDER_SITE_OTHER): Payer: Medicare Other | Admitting: Family Medicine

## 2022-04-28 ENCOUNTER — Other Ambulatory Visit: Payer: Self-pay | Admitting: Physician Assistant

## 2022-04-28 VITALS — BP 131/75 | HR 72 | Temp 97.1°F | Ht 62.0 in | Wt 191.0 lb

## 2022-04-28 DIAGNOSIS — H60501 Unspecified acute noninfective otitis externa, right ear: Secondary | ICD-10-CM | POA: Diagnosis not present

## 2022-04-28 DIAGNOSIS — I2510 Atherosclerotic heart disease of native coronary artery without angina pectoris: Secondary | ICD-10-CM | POA: Diagnosis not present

## 2022-04-28 MED ORDER — NEOMYCIN-POLYMYXIN-HC 3.5-10000-1 OT SOLN: 3.0000 [drp] | Freq: Four times a day (QID) | OTIC | 0 refills | Status: AC

## 2022-04-28 NOTE — Progress Notes (Signed)
BP 131/75   Pulse 72   Temp (!) 97.1 F (36.2 C)   Ht _0  (1.575 m)   Wt 191 lb (86.6 kg)   SpO2 96%   BMI 34.93 kg/m    Subjective:   Patient ID: Amber Stephenson, female    DOB: 1945-07-24, 76 y.o.   MRN: [id number]  HPI: Amber Stephenson is a 76 y.o. female presenting on 04/28/2022 for Ear Pain (Right for one month. Puddle of blood seen about one month ago. Denies injury, clotting. Dizzy when turning head to the right.)   HPI Ear bleeding and pain Patient is coming in complaining of ear bleeding and pain has been going on in her right ear.  She says she had the first bleeding about 4 weeks ago but then she has continued to have pain over the past 3 weeks and irritation that hurts in front of the ear as well.  She denies any fevers or chills or sinus pressure.  Relevant past medical, surgical, family and social history reviewed and updated as indicated. Interim medical history since our last visit reviewed. Allergies and medications reviewed and updated.  Review of Systems  Constitutional:  Negative for chills and fever.  HENT:  Positive for ear discharge and ear pain. Negative for congestion, sinus pressure, sinus pain, sneezing and sore throat.   Eyes:  Negative for redness and visual disturbance.  Respiratory:  Negative for chest tightness and shortness of breath.   Cardiovascular:  Negative for chest pain and leg swelling.  Genitourinary:  Negative for difficulty urinating and dysuria.  Musculoskeletal:  Negative for back pain and gait problem.  Skin:  Negative for rash.  Neurological:  Negative for light-headedness and headaches.  Psychiatric/Behavioral:  Negative for agitation and behavioral problems.   All other systems reviewed and are negative.   Per HPI unless specifically indicated above   Allergies as of 04/28/2022       Reactions   Ivp Dye [iodinated Contrast Media] Anaphylaxis, Swelling   Throat closes   Codeine Other (See Comments)   Iodine Other  (See Comments)   Latex    Latex tape pulls skin with it   Tizanidine Other (See Comments)   Weakness, goofy, bad dreams        Medication List        Accurate as of April 28, 2022  4:02 PM. If you have any questions, ask your nurse or doctor.          STOP taking these medications    predniSONE 50 MG tablet Commonly known as: DELTASONE Stopped by: Fransisca Kaufmann Liv Rallis, MD   solifenacin 5 MG tablet Commonly known as: VESIcare Stopped by: Worthy Rancher, MD       TAKE these medications    albuterol (2.5 MG/3ML) 0.083% nebulizer solution Commonly known as: PROVENTIL Take 3 mLs (2.5 mg total) by nebulization every 6 (six) hours as needed for wheezing or shortness of breath.   albuterol 108 (90 Base) MCG/ACT inhaler Commonly known as: VENTOLIN HFA Inhale 2 puffs into the lungs every 6 (six) hours as needed for wheezing or shortness of breath.   alendronate 70 MG tablet Commonly known as: FOSAMAX Take 1 tablet (70 mg total) by mouth every 7 (seven) days. Take with a full glass of water on an empty stomach. Do not lay down for at least 2 hours   ARIPiprazole 2 MG tablet Commonly known as: ABILIFY Take 2 mg by mouth at bedtime.  atorvastatin 40 MG tablet Commonly known as: LIPITOR TAKE 1 TABLET(40 MG) BY MOUTH DAILY   B-D UF III MINI PEN NEEDLES 31G X 5 MM Misc Generic drug: Insulin Pen Needle Inject into the skin.   Pen Needles 30G X 5 MM Misc Use to inject insulin 5 times daily   benztropine 1 MG tablet Commonly known as: COGENTIN Take 1 tablet (1 mg total) by mouth 2 (two) times daily.   carbamazepine 100 MG 12 hr capsule Commonly known as: CARBATROL Take by mouth at bedtime.   Dexcom G6 Sensor Misc Change every 10 days   Dexcom G6 Transmitter Misc 1 Device by Does not apply route as directed.   doxepin 10 MG capsule Commonly known as: SINEQUAN Take 10 mg by mouth at bedtime.   Eliquis 5 MG Tabs tablet Generic drug: apixaban TAKE 1  TABLET(5 MG) BY MOUTH TWICE DAILY   Emgality 120 MG/ML Soaj Generic drug: Galcanezumab-gnlm INJECT INTO THE SKIN ONCE MONTHLY   escitalopram 10 MG tablet Commonly known as: LEXAPRO Take 10 mg by mouth daily.   fluconazole 150 MG tablet Commonly known as: DIFLUCAN Take 1 now and 1 in 3 days DO NOT TAKE LIPITOR WHEN TAKING   Fluticasone-Salmeterol 500-50 MCG/DOSE Aepb Commonly known as: Advair Diskus Inhale 1 puff into the lungs in the morning and at bedtime.   furosemide 40 MG tablet Commonly known as: LASIX Take 1 tablet (40 mg total) by mouth every morning.   HYDROmorphone 2 MG tablet Commonly known as: Dilaudid Take 1 tablet (2 mg total) by mouth every 6 (six) hours as needed for severe pain.   hydrOXYzine 25 MG capsule Commonly known as: VISTARIL Take 1 capsule (25 mg total) by mouth every 8 (eight) hours as needed.   icosapent Ethyl 1 g capsule Commonly known as: VASCEPA TAKE 2 CAPSULES TWICE A DAY WITH MEALS   insulin lispro 100 UNIT/ML KwikPen Commonly known as: HumaLOG KwikPen Max Daily 50 units   Lantus SoloStar 100 UNIT/ML Solostar Pen Generic drug: insulin glargine Inject 38 Units into the skin 2 (two) times daily.   lisinopril 10 MG tablet Commonly known as: ZESTRIL Take 1 tablet (10 mg total) by mouth daily.   LORazepam 1 MG tablet Commonly known as: ATIVAN Take 1 tablet (1 mg total) by mouth 2 (two) times daily. And give 2 tablets by mouth at bedtime   metaxalone 800 MG tablet Commonly known as: SKELAXIN Take 1 tablet (800 mg total) by mouth 3 (three) times daily as needed for muscle spasms.   metFORMIN 500 MG tablet Commonly known as: GLUCOPHAGE Take 1 tablet (500 mg total) by mouth 2 (two) times daily with a meal. TAKE 1 TABLET(500 MG) BY MOUTH TWICE DAILY AFTER A MEAL   metoprolol succinate 25 MG 24 hr tablet Commonly known as: Toprol XL Take 1 tablet (25 mg total) by mouth in the morning and at bedtime.   metoprolol succinate 100 MG 24 hr  tablet Commonly known as: TOPROL-XL TAKE 1 TABLET IN THE MORNING AND AT BEDTIME   naloxone 4 MG/0.1ML Liqd nasal spray kit Commonly known as: NARCAN SMARTSIG:Both Nares   neomycin-polymyxin-hydrocortisone OTIC solution Commonly known as: CORTISPORIN Place 3 drops into the right ear 4 (four) times daily for 7 days. Started by: Worthy Rancher, MD   NON FORMULARY Diet: _____ Regular, ___  __ NAS, ___x____Consistent Carbohydrate, _______NPO _____Other   nystatin powder Commonly known as: nystatin Apply 1 Application topically 3 (three) times daily.   nystatin  ointment Commonly known as: MYCOSTATIN Apply 1 Application topically 2 (two) times daily.   ondansetron 8 MG disintegrating tablet Commonly known as: ZOFRAN-ODT Take 1 tablet (8 mg total) by mouth every 6 (six) hours as needed for nausea or vomiting.   polyethylene glycol powder 17 GM/SCOOP powder Commonly known as: GLYCOLAX/MIRALAX Take by mouth as directed.   potassium chloride SA 20 MEQ tablet Commonly known as: KLOR-CON M Take 1 tablet (20 mEq total) by mouth daily.   pregabalin 200 MG capsule Commonly known as: LYRICA TAKE 1 CAPSULE(200 MG) BY MOUTH TWICE DAILY   Prolia 60 MG/ML Sosy injection Generic drug: denosumab Inject 60 mg into the skin every 6 (six) months.   Rybelsus 14 MG Tabs Generic drug: Semaglutide Take 1 tablet (14 mg total) by mouth daily.   Vitamin D (Cholecalciferol) 25 MCG (1000 UT) Tabs Take 1 tablet by mouth daily in the afternoon.         Objective:   BP 131/75   Pulse 72   Temp (!) 97.1 F (36.2 C)   Ht _0  (1.575 m)   Wt 191 lb (86.6 kg)   SpO2 96%   BMI 34.93 kg/m   Wt Readings from Last 3 Encounters:  04/28/22 191 lb (86.6 kg)  04/21/22 193 lb (87.5 kg)  04/17/22 190 lb 8 oz (86.4 kg)    Physical Exam Vitals and nursing note reviewed.  Constitutional:      General: She is not in acute distress.    Appearance: She is well-developed. She is not  diaphoretic.  HENT:     Right Ear: Drainage (blood clots with drainage) present. No tenderness. No middle ear effusion. There is no impacted cerumen. No mastoid tenderness. Tympanic membrane is not injected, scarred, perforated, erythematous or retracted.     Left Ear: Tympanic membrane, ear canal and external ear normal.  Eyes:     Conjunctiva/sclera: Conjunctivae normal.     Pupils: Pupils are equal, round, and reactive to light.  Cardiovascular:     Rate and Rhythm: Normal rate and regular rhythm.     Heart sounds: Normal heart sounds. No murmur heard. Pulmonary:     Effort: Pulmonary effort is normal. No respiratory distress.     Breath sounds: Normal breath sounds. No wheezing.  Musculoskeletal:        General: No tenderness. Normal range of motion.  Skin:    General: Skin is warm and dry.     Findings: No rash.  Neurological:     Mental Status: She is alert and oriented to person, place, and time.     Coordination: Coordination normal.  Psychiatric:        Behavior: Behavior normal.       Assessment & Plan:   Problem List Items Addressed This Visit   None Visit Diagnoses     Acute otitis externa of right ear, unspecified type    -  Primary   Relevant Medications   neomycin-polymyxin-hydrocortisone (CORTISPORIN) OTIC solution       Will do antibiotic drops and recommended topical anti-inflammatory on the right side of her jaw.  Cannot take oral anti-inflammatories because of Eliquis Follow up plan: Return if symptoms worsen or fail to improve.  Counseling provided for all of the vaccine components No orders of the defined types were placed in this encounter.   Caryl Pina, MD [facility] 04/28/2022, 4:02 PM

## 2022-04-29 DIAGNOSIS — Z79899 Other long term (current) drug therapy: Secondary | ICD-10-CM | POA: Diagnosis not present

## 2022-04-29 DIAGNOSIS — L298 Other pruritus: Secondary | ICD-10-CM | POA: Diagnosis not present

## 2022-04-30 ENCOUNTER — Telehealth: Payer: Self-pay

## 2022-04-30 ENCOUNTER — Other Ambulatory Visit: Payer: Self-pay | Admitting: *Deleted

## 2022-04-30 DIAGNOSIS — E1169 Type 2 diabetes mellitus with other specified complication: Secondary | ICD-10-CM

## 2022-04-30 MED ORDER — APIXABAN 5 MG PO TABS: ORAL_TABLET | ORAL | 0 refills | Status: DC

## 2022-04-30 MED ORDER — HYDROMORPHONE HCL 2 MG PO TABS: 2.0000 mg | ORAL_TABLET | Freq: Four times a day (QID) | ORAL | 0 refills | Status: DC | PRN

## 2022-04-30 MED ORDER — ATORVASTATIN CALCIUM 40 MG PO TABS: ORAL_TABLET | ORAL | 0 refills | Status: DC

## 2022-04-30 NOTE — Telephone Encounter (Signed)
PMP was Reviewed.  Dr Dagoberto Ligas note was reviewed.  Amber Stephenson was called, she was instructed to call her insurance to ask about early refill. She is leaving to go out of town on 05/05/2022, last refill was on 04/08/2022. She verbalizes understanding.

## 2022-04-30 NOTE — Telephone Encounter (Signed)
Patient is calling for refill for Hydromorphone. She states she is going on vacation Monday and needs it before then. Last filled #120 on 04/08/22, next appt 05/21/22

## 2022-05-01 ENCOUNTER — Encounter: Payer: Self-pay | Admitting: Family Medicine

## 2022-05-01 ENCOUNTER — Ambulatory Visit (INDEPENDENT_AMBULATORY_CARE_PROVIDER_SITE_OTHER): Payer: Medicare Other | Admitting: Family Medicine

## 2022-05-01 DIAGNOSIS — G2581 Restless legs syndrome: Secondary | ICD-10-CM | POA: Diagnosis not present

## 2022-05-01 NOTE — Progress Notes (Signed)
   Virtual Visit  Note Due to COVID-19 pandemic this visit was conducted virtually. This visit type was conducted due to national recommendations for restrictions regarding the COVID-19 Pandemic (e.g. social distancing, sheltering in place) in an effort to limit this patient's exposure and mitigate transmission in our community. All issues noted in this document were discussed and addressed.  A physical exam was not performed with this format.  I connected with Amber Stephenson on 05/01/22 at 1348 by telephone and verified that I am speaking with the correct person using two identifiers. Amber Stephenson is currently located at home and no one is currently with him during the visit. The provider, Gwenlyn Perking, FNP is located in their office at time of visit.  I discussed the limitations, risks, security and privacy concerns of performing an evaluation and management service by telephone and the availability of in person appointments. I also discussed with the patient that there may be a patient responsible charge related to this service. The patient expressed understanding and agreed to proceed.  CC: restless leg syndrome  History and Present Illness:  HPI Amber Stephenson has a history of restless leg syndrome. She reports that this had resolved until about 2 weeks ago when the symptoms return. She reports symptoms occur at night. She has been staying well hydrated and has been stretching before bed without improvement. She has a history of chronic pain and is on prescription opiates as well as a muscle relaxer, lyrica, doxepin, and cogentin.     ROS As per HPI.   Observations/Objective: Alert and oriented x 3. Able to speak in full sentences without difficulty.   Assessment and Plan: Diagnoses and all orders for this visit:  Restless legs She will come by for labs as below to check for potential cause. Will notify patient of results when available.  -     CBC with Differential; Future -      Iron, TIBC and Ferritin Panel; Future -     Vitamin B12; Future   Follow Up Instructions: Return to office for new or worsening symptoms, or if symptoms persist.     I discussed the assessment and treatment plan with the patient. The patient was provided an opportunity to ask questions and all were answered. The patient agreed with the plan and demonstrated an understanding of the instructions.   The patient was advised to call back or seek an in-person evaluation if the symptoms worsen or if the condition fails to improve as anticipated.  The above assessment and management plan was discussed with the patient. The patient verbalized understanding of and has agreed to the management plan. Patient is aware to call the clinic if symptoms persist or worsen. Patient is aware when to return to the clinic for a follow-up visit. Patient educated on when it is appropriate to go to the emergency department.   Time call ended:  1359  I provided 11 minutes of  non face-to-face time during this encounter.    Gwenlyn Perking, FNP

## 2022-05-09 NOTE — Addendum Note (Signed)
Addended by: Maren Beach, Lunell Robart A on: 05/09/2022 04:48 PM   Modules accepted: Orders

## 2022-05-12 ENCOUNTER — Ambulatory Visit: Payer: Medicare Other | Admitting: Physician Assistant

## 2022-05-12 ENCOUNTER — Other Ambulatory Visit: Payer: Self-pay

## 2022-05-12 DIAGNOSIS — Z794 Long term (current) use of insulin: Secondary | ICD-10-CM

## 2022-05-12 MED ORDER — INSULIN LISPRO (1 UNIT DIAL) 100 UNIT/ML (KWIKPEN): PEN_INJECTOR | SUBCUTANEOUS | 3 refills | Status: DC

## 2022-05-12 MED ORDER — DEXCOM G7 SENSOR MISC: 3 refills | Status: DC

## 2022-05-12 MED ORDER — DEXCOM G7 SENSOR MISC: 1.0000 | 3 refills | Status: DC

## 2022-05-12 MED ORDER — DEXCOM G6 SENSOR MISC: 1 refills | Status: DC

## 2022-05-12 MED ORDER — DEXCOM G6 TRANSMITTER MISC: 1 refills | Status: DC

## 2022-05-12 NOTE — Telephone Encounter (Signed)
[facility]

## 2022-05-12 NOTE — Telephone Encounter (Signed)
PA needed for the [place]

## 2022-05-12 NOTE — Telephone Encounter (Signed)
Patient would like to have to switch to Dexcom G7. [facility]

## 2022-05-13 ENCOUNTER — Other Ambulatory Visit (HOSPITAL_COMMUNITY): Payer: Self-pay

## 2022-05-13 ENCOUNTER — Other Ambulatory Visit: Payer: Self-pay

## 2022-05-13 DIAGNOSIS — D485 Neoplasm of uncertain behavior of skin: Secondary | ICD-10-CM | POA: Diagnosis not present

## 2022-05-13 DIAGNOSIS — L03031 Cellulitis of right toe: Secondary | ICD-10-CM | POA: Diagnosis not present

## 2022-05-13 DIAGNOSIS — M79674 Pain in right toe(s): Secondary | ICD-10-CM | POA: Diagnosis not present

## 2022-05-13 NOTE — Telephone Encounter (Signed)
I'm getting a paid test claim for a 30 day supply. Called pharmacy to process and they were able to get a paid claim. Should be ready for pickup shortly.

## 2022-05-19 ENCOUNTER — Other Ambulatory Visit: Payer: Self-pay

## 2022-05-19 MED ORDER — EMGALITY 120 MG/ML ~~LOC~~ SOAJ: SUBCUTANEOUS | 0 refills | Status: DC

## 2022-05-20 ENCOUNTER — Ambulatory Visit: Payer: Medicare Other | Admitting: Physician Assistant

## 2022-05-20 ENCOUNTER — Other Ambulatory Visit: Payer: Self-pay

## 2022-05-20 MED ORDER — DEXCOM G7 SENSOR MISC: 3 refills | Status: DC

## 2022-05-20 MED ORDER — INSULIN LISPRO (1 UNIT DIAL) 100 UNIT/ML (KWIKPEN): PEN_INJECTOR | SUBCUTANEOUS | 3 refills | Status: DC

## 2022-05-21 ENCOUNTER — Ambulatory Visit: Payer: Medicare Other | Admitting: Physical Medicine and Rehabilitation

## 2022-05-21 ENCOUNTER — Encounter: Payer: Medicare Other | Attending: Physical Medicine and Rehabilitation | Admitting: Registered Nurse

## 2022-05-21 ENCOUNTER — Encounter: Payer: Medicare Other | Admitting: Registered Nurse

## 2022-05-21 ENCOUNTER — Encounter: Payer: Self-pay | Admitting: Registered Nurse

## 2022-05-21 VITALS — BP 136/86 | HR 74 | Ht 62.0 in | Wt 191.0 lb

## 2022-05-21 DIAGNOSIS — M545 Low back pain, unspecified: Secondary | ICD-10-CM | POA: Diagnosis not present

## 2022-05-21 DIAGNOSIS — I2510 Atherosclerotic heart disease of native coronary artery without angina pectoris: Secondary | ICD-10-CM

## 2022-05-21 DIAGNOSIS — G8929 Other chronic pain: Secondary | ICD-10-CM | POA: Diagnosis not present

## 2022-05-21 DIAGNOSIS — Z79891 Long term (current) use of opiate analgesic: Secondary | ICD-10-CM

## 2022-05-21 DIAGNOSIS — G894 Chronic pain syndrome: Secondary | ICD-10-CM | POA: Diagnosis not present

## 2022-05-21 DIAGNOSIS — Z5181 Encounter for therapeutic drug level monitoring: Secondary | ICD-10-CM | POA: Diagnosis not present

## 2022-05-21 MED ORDER — HYDROMORPHONE HCL 2 MG PO TABS: 2.0000 mg | ORAL_TABLET | Freq: Four times a day (QID) | ORAL | 0 refills | Status: DC | PRN

## 2022-05-21 NOTE — Progress Notes (Signed)
Subjective:    Patient ID: Amber Stephenson, female    DOB: 23-Feb-1946, 76 y.o.   MRN: [id number]  HPI: Amber Stephenson is a 76 y.o. female who returns for follow up appointment for chronic pain and medication refill. She states her pain is located in her lower back . She rates her pain 7. Her current exercise regime is walking short distances  and performing stretching exercises.   Ms. Orrego Morphine equivalent is 40.00 MME. She  is also prescribed Lorazepam  by Dr. Casimiro Needle .We have discussed the black box warning of using opioids and benzodiazepines. I highlighted the dangers of using these drugs together and discussed the adverse events including respiratory suppression, overdose, cognitive impairment and importance of compliance with current regimen. We will continue to monitor and adjust as indicated.  she is being closely monitored and under the care of her psychiatrist.   Last UDS was Performed on 02/05/2022, it was consistent.    Pain Inventory Average Pain 7 Pain Right Now 7 My pain is constant and sharp  In the last 24 hours, has pain interfered with the following? General activity 5 Relation with others 5 Enjoyment of life 6 What TIME of day is your pain at its worst? daytime Sleep (in general) Fair  Pain is worse with: walking and bending Pain improves with: rest and medication Relief from Meds: 7  Family History  Problem Relation Age of Onset   Alzheimer's disease Father    Diabetes Mother    Heart disease Mother    Stroke Brother    Heart disease Brother    Mental illness Brother    Diabetes Brother    Heart disease Sister        CABG   Diabetes Sister    Alcohol abuse Sister    Social History   Socioeconomic History   Marital status: Married    Spouse name: Orpah Greek   Number of children: 3   Years of education: 15   Highest education level: Bachelor's degree (e.g., BA, AB, BS)  Occupational History   Occupation: Retired    Comment: marketing   Tobacco Use   Smoking status: Never   Smokeless tobacco: Never  Vaping Use   Vaping Use: Never used  Substance and Sexual Activity   Alcohol use: Not Currently    Alcohol/week: 0.0 standard drinks of alcohol   Drug use: No   Sexual activity: Yes    Partners: Male    Birth control/protection: Post-menopausal  Other Topics Concern   Not on file  Social History Narrative   Lives at home with husband.    They have 3 children who live away - [place], [place], and [place].   She is from [place] and most of her family lives there.   Her husbands's family live nearby   Right handed.   Social Determinants of Health   Financial Resource Strain: Low Risk  (12/13/2021)   Overall Financial Resource Strain (CARDIA)    Difficulty of Paying Living Expenses: Not hard at all  Food Insecurity: No Food Insecurity (12/13/2021)   Hunger Vital Sign    Worried About Running Out of Food in the Last Year: Never true    Ran Out of Food in the Last Year: Never true  Transportation Needs: No Transportation Needs (12/13/2021)   PRAPARE - Hydrologist (Medical): No    Lack of Transportation (Non-Medical): No  Physical Activity: Inactive (12/13/2021)   Exercise Vital Sign  Days of Exercise per Week: 0 days    Minutes of Exercise per Session: 0 min  Stress: No Stress Concern Present (12/13/2021)   [place]    Feeling of Stress : Not at all  Social Connections: [place] (12/13/2021)   Social Connection and Isolation Panel [NHANES]    Frequency of Communication with Friends and Family: More than three times a week    Frequency of Social Gatherings with Friends and Family: Twice a week    Attends Religious Services: More than 4 times per year    Active Member of [employer] or Organizations: Yes    Attends Music therapist: More than 4 times per year    Marital Status: Married   Past  Surgical History:  Procedure Laterality Date   BREAST REDUCTION SURGERY     EYE SURGERY Right    cateracts   HAMMER TOE SURGERY     LEFT HEART CATH AND CORONARY ANGIOGRAPHY N/A 02/03/2018   Procedure: LEFT HEART CATH AND CORONARY ANGIOGRAPHY;  Surgeon: Martinique, Peter M, MD;  Location: [place] [facility];  Service: Cardiovascular;  Laterality: N/A;   REVERSE SHOULDER ARTHROPLASTY Right 08/19/2019   Procedure: REVERSE SHOULDER ARTHROPLASTY;  Surgeon: Netta Cedars, MD;  Location: [facility];  Service: Orthopedics;  Laterality: Right;  interscalene block   SHOULDER SURGERY Right    "I BROKE MY SHOUDLER   THIGH SURGERY     "TO REMOVE A TUMOR "   Past Surgical History:  Procedure Laterality Date   BREAST REDUCTION SURGERY     EYE SURGERY Right    cateracts   HAMMER TOE SURGERY     LEFT HEART CATH AND CORONARY ANGIOGRAPHY N/A 02/03/2018   Procedure: LEFT HEART CATH AND CORONARY ANGIOGRAPHY;  Surgeon: Martinique, Peter M, MD;  Location: [facility];  Service: Cardiovascular;  Laterality: N/A;   REVERSE SHOULDER ARTHROPLASTY Right 08/19/2019   Procedure: REVERSE SHOULDER ARTHROPLASTY;  Surgeon: Netta Cedars, MD;  Location: [facility];  Service: Orthopedics;  Laterality: Right;  interscalene block   SHOULDER SURGERY Right    "I BROKE MY SHOUDLER   THIGH SURGERY     "TO REMOVE A TUMOR "   Past Medical History:  Diagnosis Date   Acute exacerbation of CHF (congestive heart failure) ([place]) 12/13/2020   Acute on chronic diastolic CHF (congestive heart failure)/HFpEF Exacerbation 12/16/2020   Acute on chronic respiratory failure with hypoxia ([place]) 01/22/2019   Acute respiratory failure with hypoxia ([place]) 12/16/2020   Asthma    Atrial fibrillation with RVR 05/19/2017   Bipolar I disorder 01/23/2015   CAD (coronary artery disease)    Nonobstructive; Managed by Dr. 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 Wallace Keller   RLS (restless legs syndrome) 04/27/2015   Ht '5\' 2"'$  (1.575 m)   Wt 191 lb (86.6 kg)   BMI 34.93 kg/m   Opioid Risk Score:   Fall Risk Score:  `1  Depression screen [facility] 2/9     04/17/2022    2:29 PM 03/31/2022   11:35 AM 03/26/2022    8:09 AM 03/04/2022   10:57 AM 02/25/2022    2:09 PM 02/17/2022    3:30 PM 02/05/2022   10:57 AM  Depression screen PHQ 2/9  Decreased Interest 0 0 0 0 0 0 1  Down, Depressed, Hopeless 0 0 0 0 0 0 1  PHQ - 2 Score 0 0 0 0 0 0 2  Altered sleeping 3 0       Tired, decreased energy 3 0       Change in appetite 0 0       Feeling bad or failure about yourself  1 0       Trouble concentrating 3 0       Moving slowly or fidgety/restless 1 0       Suicidal thoughts 0 0       PHQ-9 Score 11 0       Difficult doing work/chores  Not difficult at all         Review of Systems  Musculoskeletal:  Positive for back pain.  All other systems reviewed and are negative.      Objective:   Physical Exam Vitals and nursing note reviewed.  Constitutional:      Appearance: Normal appearance.  Cardiovascular:     Rate and Rhythm: Normal rate and regular rhythm.     Pulses: Normal pulses.     Heart sounds: Normal heart sounds.  Pulmonary:     Effort: Pulmonary effort is normal.     Breath sounds: Normal breath  sounds.  Musculoskeletal:     Cervical back: Normal range of motion and neck supple.     Comments: Normal Muscle Bulk and Muscle Testing Reveals:  Upper Extremities: Full ROM and Muscle Strength 5/5   Lumbar Hypersensitivity Lower Extremities: Full ROM and Muscle Strength 5/5 Bilateral Lower Extremities Flexion Produces Pain into her Lower Back Arises from Table slowly Narrow Based  Gait     Skin:    General: Skin is warm and dry.  Neurological:     Mental Status: She is alert and oriented to person, place, and time.  Psychiatric:        Mood and Affect: Mood normal.        Behavior: Behavior normal.         Assessment & Plan:  Chronic Bilateral Low Back Pain: Continue HEP as tolerated. Continue to Monitor.  Chronic Pain Syndrome: Refilled Dilaudid 2 mg every 6 hours as needed for pain #120. We will continue the opioid monitoring program, this consists of regular clinic visits, examinations, urine drug screen, pill counts as well as use of [place] Controlled Substance Reporting system. A 12 month History has been reviewed on the [place] Controlled Substance Reporting System on 05/21/2022.  We will continue to monitor and adjust as indicated.   F/U with Dr Dagoberto Ligas

## 2022-05-27 ENCOUNTER — Other Ambulatory Visit: Payer: Self-pay | Admitting: Family Medicine

## 2022-05-27 ENCOUNTER — Other Ambulatory Visit: Payer: Self-pay | Admitting: Physician Assistant

## 2022-05-27 DIAGNOSIS — I5032 Chronic diastolic (congestive) heart failure: Secondary | ICD-10-CM

## 2022-05-27 DIAGNOSIS — E782 Mixed hyperlipidemia: Secondary | ICD-10-CM

## 2022-05-27 DIAGNOSIS — I4821 Permanent atrial fibrillation: Secondary | ICD-10-CM

## 2022-05-28 ENCOUNTER — Encounter: Payer: Self-pay | Admitting: Internal Medicine

## 2022-05-28 ENCOUNTER — Ambulatory Visit (INDEPENDENT_AMBULATORY_CARE_PROVIDER_SITE_OTHER): Payer: Medicare Other | Admitting: Internal Medicine

## 2022-05-28 VITALS — BP 136/80 | HR 104 | Ht 62.0 in | Wt 195.0 lb

## 2022-05-28 DIAGNOSIS — E119 Type 2 diabetes mellitus without complications: Secondary | ICD-10-CM | POA: Diagnosis not present

## 2022-05-28 DIAGNOSIS — Z794 Long term (current) use of insulin: Secondary | ICD-10-CM | POA: Diagnosis not present

## 2022-05-28 LAB — POCT GLYCOSYLATED HEMOGLOBIN (HGB A1C): Hemoglobin A1C: 6.8 % — AB (ref 4.0–5.6)

## 2022-05-28 MED ORDER — METFORMIN HCL 500 MG PO TABS: 500.0000 mg | ORAL_TABLET | Freq: Two times a day (BID) | ORAL | 3 refills | Status: DC

## 2022-05-28 MED ORDER — INSULIN LISPRO (1 UNIT DIAL) 100 UNIT/ML (KWIKPEN): PEN_INJECTOR | SUBCUTANEOUS | 6 refills | Status: DC

## 2022-05-28 MED ORDER — TIRZEPATIDE 5 MG/0.5ML ~~LOC~~ SOAJ: 5.0000 mg | SUBCUTANEOUS | 3 refills | Status: DC

## 2022-05-28 MED ORDER — INSULIN PEN NEEDLE 29G X 12MM MISC: 1.0000 | Freq: Every day | 3 refills | Status: DC

## 2022-05-28 MED ORDER — TOUJEO MAX SOLOSTAR 300 UNIT/ML ~~LOC~~ SOPN: 68.0000 [IU] | PEN_INJECTOR | Freq: Every day | SUBCUTANEOUS | 3 refills | Status: DC

## 2022-05-28 NOTE — Patient Instructions (Addendum)
-   Switch Rybelsus to Mounjaro 5 mg once weekly  - Switch Lantus to Toujeo 68 units ONCE Daily ( or lantus 34 units twice daily ) - Continue Metformin 500 mg, 1 tablets twice daily  - Increase Humalog 4 units with each meal  -Humalog correctional insulin: ADD extra units on insulin to your meal-time Humalog  dose if your blood sugars are higher than 160. Use the scale below to help guide you:   Blood sugar before meal Number of units to inject  Less than 160 0 unit  161 -  190 1 units  191 -  220 2 units  221 -  250 3 units  251 -  280 4 units  281 -  310 5 units  311 -  340 6 units  341 -  370 7 units  371 -  400 8 units       HOW TO TREAT LOW BLOOD SUGARS (Blood sugar LESS THAN 70 MG/DL) Please follow the RULE OF 15 for the treatment of hypoglycemia treatment (when your (blood sugars are less than 70 mg/dL)   STEP 1: Take 15 grams of carbohydrates when your blood sugar is low, which includes:  3-4 GLUCOSE TABS  OR 3-4 OZ OF JUICE OR REGULAR SODA OR ONE TUBE OF GLUCOSE GEL    STEP 2: RECHECK blood sugar in 15 MINUTES STEP 3: If your blood sugar is still low at the 15 minute recheck --> then, go back to STEP 1 and treat AGAIN with another 15 grams of carbohydrates.

## 2022-05-28 NOTE — Progress Notes (Signed)
Name: Amber Stephenson  Age/ Sex: 76 y.o., female   MRN/ DOB: [id number], July 25, 1945     PCP: [name], MD   Reason for Endocrinology Evaluation: Type 2 Diabetes Mellitus  Initial Endocrine Consultative Visit: 09/24/2020    PATIENT IDENTIFIER: [name] is a 76 y.o. female with a past medical history of T2DM, HTN, Dyslipidemia, OSA, CHF and A.Fib. The patient has followed with Endocrinology clinic since 09/24/2020 for consultative assistance with management of her diabetes.  DIABETIC HISTORY:  Amber Stephenson was diagnosed with DM in 2007, she has been on basal insulin for years, prandial insulin started 2016. GLP-1 agonists started in 2017. Metformin restarted 2017. Her hemoglobin A1c has ranged from 6.7% in 08/2019, peaking at 10.9% in 04/2020.  Saw Dr. Dorris Fetch in 2016 and Dr. Dwyane Dee in 2017   On her initial visit to our clinic she had an A1c 9.1% . We increased Metformin, Rybelsus and adjusted MDI regimen.    Jardiance discontinues 10/2021 due to recurrent genital skin infections and yeast infections   SUBJECTIVE:   During the last visit (11/22/2021): A1c 7.3%     Today (05/28/2022): Amber Stephenson is here for a follow up on diabetes management.  She is accompanied by her spouse today. She checks her blood sugars multiple  times daily through the CGM. Marland Kitchen The patient has had hypoglycemic episodes since the last clinic visit  Denies nausea, vomiting or diarrhea  Interested in [place]:  Metformin 500 mg 1 tablet BID  Rybelsus 14 mg daily Lantus 38 units BID Humalog 3 units TIDQAC     Statin: yes ACE-I/ARB: no Prior Diabetic Education: no    CONTINUOUS GLUCOSE MONITORING RECORD INTERPRETATION    Dates of Recording: 12/14-12/27/2023  Sensor description: dexcom   Results statistics:   CGM use % of time 100  Average and SD 174/52  Time in range 58 %  % Time Above 180 33  % Time above 250 8  % Time Below target <1     Glycemic  patterns summary: BG's optimal at night with hyperglycemia noted during the day   Hyperglycemic episodes  postprandial   Hypoglycemic episodes occurred variable  Overnight periods: trends down      DIABETIC COMPLICATIONS: Microvascular complications:   Denies: CKD,  retinopathy , neuropathy ( she has this in the charts but denies neuropathy) Last eye exam: Completed 07/2020   Macrovascular complications:   Denies: CAD, PVD, CVA    HISTORY:  Past Medical History:  Past Medical History:  Diagnosis Date   Acute exacerbation of CHF (congestive heart failure) ([place]) 12/13/2020   Acute on chronic diastolic CHF (congestive heart failure)/HFpEF Exacerbation 12/16/2020   Acute on chronic respiratory failure with hypoxia ([place]) 01/22/2019   Acute respiratory failure with hypoxia ([place]) 12/16/2020   Asthma    Atrial fibrillation with RVR 05/19/2017   Bipolar I disorder 01/23/2015   CAD (coronary artery disease)    Nonobstructive; Managed by Dr. 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 Wallace Keller   RLS (restless legs syndrome) 04/27/2015   Past Surgical History:  Past Surgical History:  Procedure Laterality Date   BREAST REDUCTION SURGERY     EYE  SURGERY Right    cateracts   HAMMER TOE SURGERY     LEFT HEART CATH AND CORONARY ANGIOGRAPHY N/A 02/03/2018   Procedure: LEFT HEART CATH AND CORONARY ANGIOGRAPHY;  Surgeon: Martinique, Peter M, MD;  Location: [facility];  Service: Cardiovascular;  Laterality: N/A;   REVERSE SHOULDER ARTHROPLASTY Right 08/19/2019   Procedure: REVERSE SHOULDER ARTHROPLASTY;  Surgeon: Netta Cedars, MD;  Location: [facility];  Service: Orthopedics;  Laterality: Right;  interscalene block   SHOULDER SURGERY Right    "I BROKE MY SHOUDLER   THIGH SURGERY     "TO REMOVE A TUMOR "   Social History:  reports that she has never smoked. She has never used smokeless tobacco. She reports that she does not currently use alcohol. She reports that she does not use drugs. Family History:  Family History  Problem Relation Age of Onset   Alzheimer's disease Father    Diabetes Mother    Heart disease Mother    Stroke Brother    Heart disease Brother    Mental illness Brother    Diabetes Brother    Heart disease Sister        CABG   Diabetes Sister    Alcohol abuse Sister      HOME MEDICATIONS: Allergies as of 05/28/2022       Reactions   Ivp Dye [iodinated Contrast Media] Anaphylaxis, Swelling   Throat closes   Codeine Other (See Comments)   Iodine Other (See Comments)   Latex    Latex tape pulls skin with it   Tizanidine Other (See Comments)   Weakness, goofy, bad dreams        Medication List        Accurate as of May 28, 2022  2:09 PM. If you have any questions, ask your nurse or doctor.          STOP taking these medications    Lantus SoloStar 100 UNIT/ML Solostar Pen Generic drug: insulin glargine Replaced by: Toujeo Max SoloStar 300 UNIT/ML Solostar Pen Stopped by: Dorita Sciara, MD   Rybelsus 14 MG Tabs Generic drug: Semaglutide Stopped by: Dorita Sciara, MD       TAKE these medications    albuterol (2.5 MG/3ML) 0.083% nebulizer solution Commonly known as:  PROVENTIL Take 3 mLs (2.5 mg total) by nebulization every 6 (six) hours as needed for wheezing or shortness of breath.   albuterol 108 (90 Base) MCG/ACT inhaler Commonly known as: VENTOLIN HFA Inhale 2 puffs into the lungs every 6 (six) hours as needed for wheezing or shortness of breath.   alendronate 70 MG tablet Commonly known as: FOSAMAX Take 1 tablet (70 mg total) by mouth every 7 (seven) days. Take with a full glass of water on an empty stomach. Do not lay down for at least 2 hours   apixaban 5 MG Tabs tablet Commonly known as: Eliquis TAKE 1 TABLET(5 MG) BY MOUTH TWICE DAILY   ARIPiprazole 2 MG tablet  Commonly known as: ABILIFY Take 2 mg by mouth at bedtime.   atorvastatin 40 MG tablet Commonly known as: LIPITOR TAKE 1 TABLET(40 MG) BY MOUTH DAILY   B-D UF III MINI PEN NEEDLES 31G X 5 MM Misc Generic drug: Insulin Pen Needle Inject into the skin.   Pen Needles 30G X 5 MM Misc Use to inject insulin 5 times daily   benztropine 1 MG tablet Commonly known as: COGENTIN Take 1 tablet (1 mg total) by mouth 2 (two) times daily.   carbamazepine 100 MG 12 hr capsule Commonly known as: CARBATROL Take by mouth at bedtime.   Dexcom G6 Transmitter Misc Use as instructed to check blood sugar. Change every 90 days   Dexcom G7 Sensor Misc Change sensor every 10 days   doxepin 10 MG capsule Commonly known as: SINEQUAN Take 10 mg by mouth at bedtime.   Emgality 120 MG/ML Soaj Generic drug: Galcanezumab-gnlm One injection per month   escitalopram 10 MG tablet Commonly known as: LEXAPRO Take 10 mg by mouth daily.   fluconazole 150 MG tablet Commonly known as: DIFLUCAN Take 1 now and 1 in 3 days DO NOT TAKE LIPITOR WHEN TAKING   Fluticasone-Salmeterol 500-50 MCG/DOSE Aepb Commonly known as: Advair Diskus Inhale 1 puff into the lungs in the morning and at bedtime.   furosemide 40 MG tablet Commonly known as: LASIX Take 1 tablet (40 mg total) by mouth every morning.    HYDROmorphone 2 MG tablet Commonly known as: Dilaudid Take 1 tablet (2 mg total) by mouth every 6 (six) hours as needed for severe pain.   hydrOXYzine 25 MG capsule Commonly known as: VISTARIL Take 1 capsule (25 mg total) by mouth every 8 (eight) hours as needed.   icosapent Ethyl 1 g capsule Commonly known as: VASCEPA TAKE 2 CAPSULES TWICE A DAY WITH MEALS   insulin lispro 100 UNIT/ML KwikPen Commonly known as: HumaLOG KwikPen Max daily 50 units What changed: additional instructions Changed by: Dorita Sciara, MD   lisinopril 10 MG tablet Commonly known as: ZESTRIL Take 1 tablet (10 mg total) by mouth daily.   LORazepam 1 MG tablet Commonly known as: ATIVAN Take 1 tablet (1 mg total) by mouth 2 (two) times daily. And give 2 tablets by mouth at bedtime   metaxalone 800 MG tablet Commonly known as: SKELAXIN Take 1 tablet (800 mg total) by mouth 3 (three) times daily as needed for muscle spasms.   metFORMIN 500 MG tablet Commonly known as: GLUCOPHAGE Take 1 tablet (500 mg total) by mouth 2 (two) times daily with a meal. TAKE 1 TABLET(500 MG) BY MOUTH TWICE DAILY AFTER A MEAL   metoprolol succinate 25 MG 24 hr tablet Commonly known as: Toprol XL Take 1 tablet (25 mg total) by mouth in the morning and at bedtime.   metoprolol succinate 100 MG 24 hr tablet Commonly known as: TOPROL-XL TAKE 1 TABLET IN THE MORNING AND AT BEDTIME   naloxone 4 MG/0.1ML Liqd nasal spray kit Commonly known as: NARCAN SMARTSIG:Both Nares   NON FORMULARY Diet: _____ Regular, ___  __ NAS, ___x____Consistent Carbohydrate, _______NPO _____Other   nystatin powder Commonly known as: nystatin Apply 1 Application topically 3 (three) times daily.   nystatin ointment Commonly known as: MYCOSTATIN Apply 1 Application topically 2 (two) times daily.   ondansetron 8 MG disintegrating tablet Commonly known as: ZOFRAN-ODT Take 1 tablet (8 mg total) by mouth every 6 (six) hours as needed  for nausea or vomiting.   polyethylene  glycol powder 17 GM/SCOOP powder Commonly known as: GLYCOLAX/MIRALAX Take by mouth as directed.   potassium chloride SA 20 MEQ tablet Commonly known as: KLOR-CON M Take 1 tablet (20 mEq total) by mouth daily.   pregabalin 200 MG capsule Commonly known as: LYRICA TAKE 1 CAPSULE(200 MG) BY MOUTH TWICE DAILY   Prolia 60 MG/ML Sosy injection Generic drug: denosumab Inject 60 mg into the skin every 6 (six) months.   telmisartan 20 MG tablet Commonly known as: MICARDIS Take by mouth.   tirzepatide 5 MG/0.5ML Pen Commonly known as: MOUNJARO Inject 5 mg into the skin once a week. Started by: Dorita Sciara, MD   Toujeo Max SoloStar 300 UNIT/ML Solostar Pen Generic drug: insulin glargine (2 Unit Dial) Inject 68 Units into the skin daily in the afternoon. Replaces: Lantus SoloStar 100 UNIT/ML Solostar Pen Started by: Dorita Sciara, MD   Vitamin D (Cholecalciferol) 25 MCG (1000 UT) Tabs Take 1 tablet by mouth daily in the afternoon.         OBJECTIVE:   Vital Signs: BP 136/80 (BP Location: Left Arm, Patient Position: Sitting, Cuff Size: Large)   Pulse (!) 104   Ht _0  (1.575 m)   Wt 195 lb (88.5 kg)   SpO2 98%   BMI 35.67 kg/m   Wt Readings from Last 3 Encounters:  05/28/22 195 lb (88.5 kg)  05/21/22 191 lb (86.6 kg)  04/28/22 191 lb (86.6 kg)     Exam: General: Pt appears well and is in NAD  Lungs: Clear with good BS bilat   Heart: RRR   Extremities: No pretibial edema.   Neuro: MS is good with appropriate affect, pt is alert and Ox3    DM foot exam: 08/28/2021   The skin of the feet is without sores or ulcerations, left 2nd toe deformity  The pedal pulses are 1+ on right and 1+ on left. The sensation is intact to a screening 5.07, 10 gram monofilament bilaterally   DATA REVIEWED:  Lab Results  Component Value Date   HGBA1C 6.8 (A) 05/28/2022   HGBA1C 7.3 (A) 11/22/2021   HGBA1C 7.8 (A)  08/28/2021   Lab Results  Component Value Date   MICROALBUR 1.9 07/23/2015   LDLCALC 70 04/03/2021   CREATININE 1.01 (H) 12/06/2021   Lab Results  Component Value Date   MICRALBCREAT 444 (H) 01/13/2022     Lab Results  Component Value Date   CHOL 161 04/03/2021   HDL 41 04/03/2021   LDLCALC 70 04/03/2021   LDLDIRECT 62 03/17/2016   TRIG 311 (H) 04/03/2021   CHOLHDL 3.9 04/03/2021         ASSESSMENT / PLAN / RECOMMENDATIONS:   1) Type 2 Diabetes Mellitus, Optimally controlled, Without complications - Most recent A1c of 6.8 %. Goal A1c < 7.0 %.    - A1c trending down  -She has noted a drop in the Dexcom G7 signal, she was provided with a receiver as she prefers this, but I have asked her to try taking it around with her as it may be easier to just keep using her phone - I explained to the pt that [facility] would be a replacement for Rybelsus not Jardiance .  She understands that if this is not approved she will remain on Rybelsus as she has no side effects to Rybelsus at this time -I will attempt to change her Lantus to [facility], that way she will inject once daily instead of twice daily -Will increase  prandial insulin as below   MEDICATIONS:  -Continue Metformin 500 mg, 1 tablets twice daily  -Switch Lantus to Toujeo 68 units daily -Switch Rybelsus to Mounjaro 5 mg weekly - INcrease  Humalog 4 units TIDQAC - Correction factor: Humalog (BG -130/30)   EDUCATION / INSTRUCTIONS: BG monitoring instructions: Patient is instructed to check her blood sugars 3 times a day, before meals . Call [facility] if: BG persistently < 70 I reviewed the Rule of 15 for the treatment of hypoglycemia in detail with the patient. Literature supplied.   2) Diabetic complications:  Eye: Does not have known diabetic retinopathy.  Neuro/ Feet: Does not have known diabetic peripheral neuropathy .  Renal: Patient does not have known baseline CKD. She   is not on an ACEI/ARB at  present.       F/U in 6 months     Signed electronically by: Mack Guise, MD  [facility]  [facility] [address] [place], [place] [address] Phone: [phone] FAX: [phone]   CC: [name], MD [place] [address] Phone: [phone]  Fax: [phone]  Return to Endocrinology clinic as below: Future Appointments  Date Time Provider [place]  05/29/2022  2:30 PM [facility] NURSE [facility] None  07/21/2022  1:40 PM Courtney Heys, MD [facility] CPR  08/08/2022  1:00 PM Hazle Coca, PhD [facility] None  08/08/2022  2:00 PM [facility] [place] None  08/14/2022 10:00 AM Hazle Coca, PhD [facility] None  10/15/2022  2:00 PM Sueanne Margarita, MD [facility]  12/18/2022 11:15 AM [facility]-ANNUAL WELLNESS VISIT [facility] None

## 2022-05-29 ENCOUNTER — Ambulatory Visit (INDEPENDENT_AMBULATORY_CARE_PROVIDER_SITE_OTHER): Payer: Medicare Other | Admitting: *Deleted

## 2022-05-29 DIAGNOSIS — M810 Age-related osteoporosis without current pathological fracture: Secondary | ICD-10-CM

## 2022-05-29 MED ORDER — DENOSUMAB 60 MG/ML ~~LOC~~ SOSY
60.0000 mg | PREFILLED_SYRINGE | Freq: Once | SUBCUTANEOUS | Status: AC
  Administered 2022-05-29: 60 mg via SUBCUTANEOUS

## 2022-05-29 NOTE — Progress Notes (Signed)
Prolia injection left arm, subcutaneous. Patient tolerated well.

## 2022-05-30 ENCOUNTER — Telehealth: Payer: Self-pay

## 2022-05-30 ENCOUNTER — Other Ambulatory Visit (HOSPITAL_COMMUNITY): Payer: Self-pay

## 2022-05-30 NOTE — Telephone Encounter (Signed)
Mounjaro and Toujeo need  PA.

## 2022-05-30 NOTE — Telephone Encounter (Signed)
Patient advised and verbalized understanding 

## 2022-05-30 NOTE — Telephone Encounter (Signed)
Tried to initiate PA's for both and got a response that they are already pending. Looks like maybe [employer] initiated both on their end. I will make a note to follow up with them on the status next week.

## 2022-05-30 NOTE — Telephone Encounter (Signed)
Patient Amber Stephenson was 300 fasting at 3am, then at 9:00am it was 370 fasting, patient took 10 units of Humalog and Metformin and she recheck at 1030am and it was 320. Patient did eat at 10:30am and she had a bowl of grits with no butter.

## 2022-06-03 NOTE — Telephone Encounter (Signed)
Patient calling to follow up on PA for [facility] and Toujeo.

## 2022-06-04 ENCOUNTER — Encounter: Payer: Self-pay | Admitting: Physician Assistant

## 2022-06-04 ENCOUNTER — Other Ambulatory Visit (HOSPITAL_COMMUNITY): Payer: Self-pay

## 2022-06-04 ENCOUNTER — Ambulatory Visit: Payer: Medicare Other | Admitting: Physician Assistant

## 2022-06-04 ENCOUNTER — Ambulatory Visit (INDEPENDENT_AMBULATORY_CARE_PROVIDER_SITE_OTHER): Payer: Medicare Other | Admitting: Physician Assistant

## 2022-06-04 VITALS — BP 104/69 | HR 68 | Resp 18 | Ht 62.0 in | Wt 197.0 lb

## 2022-06-04 DIAGNOSIS — G3184 Mild cognitive impairment, so stated: Secondary | ICD-10-CM

## 2022-06-04 NOTE — Addendum Note (Signed)
Addended by: Maren Beach, Jovita Persing A on: 06/04/2022 08:05 AM   Modules accepted: Orders

## 2022-06-04 NOTE — Patient Instructions (Signed)
It was a pleasure to see you today at our office.   Recommendations:  Follow up in 6  months  neurocognitive testing   Whom to call:  Memory  decline, memory medications: Call our office [phone]   For psychiatric meds, mood meds: Please have your primary care physician manage these medications.   Counseling regarding caregiver distress, including caregiver depression, anxiety and issues regarding community resources, adult day care programs, adult living facilities, or memory care questions:   Feel free to contact [place], Social Worker at [phone]   For assessment of decision of mental capacity and competency:  Call Dr. Anthoney Harada, geriatric psychiatrist at [phone]  For guidance in geriatric dementia issues please call [facility] [phone]  For guidance regarding [facility] and if placement were needed at the facility, contact Arnell Asal, Social Worker tel: [phone]  If you have any severe symptoms of a stroke, or other severe issues such as confusion,severe chills or fever, etc call 911 or go to the ER as you may need to be evaluated further   Feel free to visit Facebook page " [facility]" for tips of how to care for people with memory problems.     RECOMMENDATIONS FOR ALL PATIENTS WITH MEMORY PROBLEMS: 1. Continue to exercise (Recommend 30 minutes of walking everyday, or 3 hours every week) 2. Increase social interactions - continue going to [place] and enjoy social gatherings with friends and family 3. Eat healthy, avoid fried foods and eat more fruits and vegetables 4. Maintain adequate blood pressure, blood sugar, and blood cholesterol level. Reducing the risk of stroke and cardiovascular disease also helps promoting better memory. 5. Avoid stressful situations. Live a simple life and avoid aggravations. Organize your time and prepare for the next day in anticipation. 6. Sleep well, avoid any interruptions  of sleep and avoid any distractions in the bedroom that may interfere with adequate sleep quality 7. Avoid sugar, avoid sweets as there is a strong link between excessive sugar intake, diabetes, and cognitive impairment We discussed the Mediterranean diet, which has been shown to help patients reduce the risk of progressive memory disorders and reduces cardiovascular risk. This includes eating fish, eat fruits and green leafy vegetables, nuts like almonds and hazelnuts, walnuts, and also use olive oil. Avoid fast foods and fried foods as much as possible. Avoid sweets and sugar as sugar use has been linked to worsening of memory function.  There is always a concern of gradual progression of memory problems. If this is the case, then we may need to adjust level of care according to patient needs. Support, both to the patient and caregiver, should then be put into place.    FALL PRECAUTIONS: Be cautious when walking. Scan the area for obstacles that may increase the risk of trips and falls. When getting up in the mornings, sit up at the edge of the bed for a few minutes before getting out of bed. Consider elevating the bed at the head end to avoid drop of blood pressure when getting up. Walk always in a well-lit room (use night lights in the walls). Avoid area rugs or power cords from appliances in the middle of the walkways. Use a walker or a cane if necessary and consider physical therapy for balance exercise. Get your eyesight checked regularly.  FINANCIAL OVERSIGHT: Supervision, especially oversight when making financial decisions or transactions is also recommended.  HOME SAFETY: Consider the safety of the kitchen when operating appliances like  stoves, microwave oven, and blender. Consider having supervision and share cooking responsibilities until no longer able to participate in those. Accidents with firearms and other hazards in the house should be identified and addressed as well.   ABILITY TO  BE LEFT ALONE: If patient is unable to contact 911 operator, consider using LifeLine, or when the need is there, arrange for someone to stay with patients. Smoking is a fire hazard, consider supervision or cessation. Risk of wandering should be assessed by caregiver and if detected at any point, supervision and safe proof recommendations should be instituted.  MEDICATION SUPERVISION: Inability to self-administer medication needs to be constantly addressed. Implement a mechanism to ensure safe administration of the medications.   DRIVING: Regarding driving, in patients with progressive memory problems, driving will be impaired. We advise to have someone else do the driving if trouble finding directions or if minor accidents are reported. Independent driving assessment is available to determine safety of driving.   If you are interested in the driving assessment, you can contact the following:  [employer] in [place]  [place] [phone]  [place]  [facility] [phone] or [phone]

## 2022-06-04 NOTE — Progress Notes (Signed)
Assessment/Plan:   Mild Cognitive Impairment   Amber Stephenson is a very pleasant 77 y.o. RH female with extensive medical history including hypertension, hyperlipidemia, OSA not on CPAP (claustrophobia), DM2, CHF, A-fib on Eliquis, chronic pain, history of pulmonary hypertension, bipolar disorder on multiple antipsychotics followed by not on antidementia meds .     Recommendations:   Follow up in  6 months. Patient has a scheduled neuropsychological evaluation on 08/08/2022 for clarity of diagnosis and disease progression Recommend good control of cardiovascular risk factors.   Continue to control mood as per Psychiatry Recommend discuss with pertinent providers regarding polypharmacy, specifically muscle relaxers, pain meds and antipsychotics which indeed may contribute to some of her memory difficulties     Subjective:   This patient is accompanied in the office by her husband who supplements the history. Previous records as well as any outside records available were reviewed prior to todays visit.   Patient was last seen on 11/07/2021, at which time her MMSE was 28/30.     Any changes in memory since last visit? Patient has reported some difficulty remembering recent conversations and people names and "chunks of time". " I Went to dinner and 1 week later I could not remember who I met, what I ate, etc.". Sometimes she may need a cue." I wake up in the middle of the night and I forget what I ate for dinner". "LTM is fantastic"-husband says. She was hesitant to come today because she did not wish to test .  repeats oneself?  Denies  Disoriented when walking into a room?  Patient denies  Leaving objects in unusual places?  Patient denies   Wandering behavior?   denies   Any personality changes since last visit?   denies   Any worsening depression?: denies   Hallucinations or paranoia? If she wakes up from a nap she may ask "where is mom? Forgetting she has died" Sometimes she has a  sensation that her daughter is little and lives with her "she is adult and lives in [place]"   Seizures?   denies    Any sleep changes? "Always a problem.  lately I wake up at 3 and can't go back to sleep and then I am tired and confused and sleepy".  Endorsed vivid dreams, Denies REM behavior or sleepwalking   Sleep apnea? Patient denies, but she is to have this followed up as outpatient, she refuses CPAP due to claustrophobia, may be considering "INSPIRE". .  Any hygiene concerns?   denies   Independent of bathing and dressing? Husband helps because it is physically very difficult.  Does the patient needs help with medications?  Her husband fixes the pillbox, then she takes it by herself, occasionally missing a dose.     Who is in charge of the finances?  Husband is in charge     Any changes in appetite? Decreased appetite.      Patient have trouble swallowing?  Denies   Does the patient cook? Denies Any kitchen accidents such as leaving the stove on?   Denies   Any headaches?  Denies  Vision changes? Denies  Chronic back pain endorsed, multiple medications including Dilaudid and Lyrica, sometimes a muscle relaxer.   Ambulates with difficulty?  She reports poor balance due to back pain. Had PT and did not help. Takes local pain treatment, was doing accupunture, wants to try acupuncture again Recent falls or head injuries?  Endorsed, had mechanical falls  in the bathroom and  then  in the bedroom, "I hit hard and dig a hole, no LOC, no hospitalization. Denies bruising or soreness.  Unilateral weakness, numbness or tingling?  denies   Any tremors?  She had a history hand tremors in the past, none today  Any anosmia?  denies   Any incontinence of urine?  denies   Any bowel dysfunction? Constipation due to pain meds  Patient lives  with husband Does the patient drive?No longer drives   History on Initial Assessment 07/17/2017: This is a 77 year old right-handed woman with a history of hypertension,  hyperlipidemia, diabetes, atrial fibrillation, chronic pain, bipolar disorder, presenting for evaluation of memory changes. She feels her memory has gotten really bad over the past 2 months. She cannot remember words/word-finding difficulties. She "does not think it's Alzheimer's" but feels like she is not paying attention. Family reminds her that they had told her something already previously. Her husband has to remind her of her medications. They put her pills together in a pillbox. Her husband is in charge of finances. She stopped driving 4 years ago, she denied getting lost when she used to drive. She started noticing memory changes after she was started on a medication after she had her colonoscopy. She has also been dealing with a lot of pain, she states her Percocet dose was cut in half a month ago and this is causing her a lot of issues. She has poor sleep, 3 hours at the most. She was recently started on Trazodone by her psychiatrist, which may be helping. Her father had Alzheimer's disease. She denies any history of significant head injuries, no alcohol use.   She has frequent headaches, around 20 headache days a month with stabbing pain in the frontal region that can last up to 3 days, with associated nausea/vomiting. If she takes medication, pain lasts up to 2 hours. She has dizziness at least 1-2 times a day where it feels like the room is spinning. She has had falls for "no clear reason," last fall was 3 months ago. She has chronic neck and back pain, no focal numbness/tingling/weakness. She has tremors in both hands and has a diagnosis of essential tremor, it is "very difficult to do things," it has affected her handwriting, computer use. No family history of tremors. No bowel/bladder dysfunction or anosmia.      I personally reviewed MRI brain without contrast done 05/13/17 which did not show any acute changes. There was mild diffuse atrophy and mild to moderate chronic microvascular disease.    Neuropsychological testing in 04/2019 showed primary areas of impairment with processing speed and attention/concentration. 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 She also scored in the mild range of anxiety and depression, and it was felt that the combination of these with daily headaches, dizziness, sleep apnea, and chronic pain, negatively influence day to day cognitive function.    MRI brain 04/28/21  1. No acute intracranial process. 2. Mildly advanced atrophy for age, with a suggestion of slightly greater atrophy in the right temporal lobe.  Past Medical History:  Diagnosis Date   Acute exacerbation of CHF (congestive heart failure) ([place]) 12/13/2020   Acute on chronic diastolic CHF (congestive heart failure)/HFpEF Exacerbation 12/16/2020   Acute on chronic respiratory failure with hypoxia ([place]) 01/22/2019   Acute respiratory failure with hypoxia ([place]) 12/16/2020   Asthma    Atrial fibrillation with RVR 05/19/2017   Bipolar I disorder  01/23/2015   CAD (coronary artery disease)    Nonobstructive; Managed by Dr. Bronson Ing   Cardiomegaly 01/12/2018   Chronic anticoagulation 05/30/2015   Chronic atrial fibrillation ([place]) 01/04/2015   Chronic back pain    Lower back   Chronic diastolic CHF (congestive heart failure) 05/30/2015   Diabetes mellitus, type 2, without complication    Diabetic neuropathy 02/06/2016   Dysrhythmia    A-Fib   Essential hypertension    Herpes genitalis in women 07/16/2015   Hyperlipidemia    Hypoxia 02/01/2019   Insomnia 01/23/2015   Lipoma 02/08/2015   Major depressive disorder    Migraine headache with aura 02/12/2016   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   NAFL (nonalcoholic fatty liver) 28/36/6294   OSA (obstructive sleep apnea) 02/24/2019   10/09/2018 - HST  - AHI 40.6    Pulmonary hypertension    Renal insufficiency    Managed by Dr.  Wallace Keller   RLS (restless legs syndrome) 04/27/2015     Past Surgical History:  Procedure Laterality Date   BREAST REDUCTION SURGERY     EYE SURGERY Right    cateracts   HAMMER TOE SURGERY     LEFT HEART CATH AND CORONARY ANGIOGRAPHY N/A 02/03/2018   Procedure: LEFT HEART CATH AND CORONARY ANGIOGRAPHY;  Surgeon: Martinique, Peter M, MD;  Location: [facility];  Service: Cardiovascular;  Laterality: N/A;   REVERSE SHOULDER ARTHROPLASTY Right 08/19/2019   Procedure: REVERSE SHOULDER ARTHROPLASTY;  Surgeon: Netta Cedars, MD;  Location: [facility];  Service: Orthopedics;  Laterality: Right;  interscalene block   SHOULDER SURGERY Right    "I BROKE MY SHOUDLER   THIGH SURGERY     "TO REMOVE A TUMOR "     PREVIOUS MEDICATIONS:   CURRENT MEDICATIONS:  Outpatient Encounter Medications as of 06/04/2022  Medication Sig   albuterol (PROVENTIL) (2.5 MG/3ML) 0.083% nebulizer solution Take 3 mLs (2.5 mg total) by nebulization every 6 (six) hours as needed for wheezing or shortness of breath.   albuterol (VENTOLIN HFA) 108 (90 Base) MCG/ACT inhaler Inhale 2 puffs into the lungs every 6 (six) hours as needed for wheezing or shortness of breath.   alendronate (FOSAMAX) 70 MG tablet Take 1 tablet (70 mg total) by mouth every 7 (seven) days. Take with a full glass of water on an empty stomach. Do not lay down for at least 2 hours   apixaban (ELIQUIS) 5 MG TABS tablet TAKE 1 TABLET(5 MG) BY MOUTH TWICE DAILY   ARIPiprazole (ABILIFY) 2 MG tablet Take 2 mg by mouth at bedtime.   atorvastatin (LIPITOR) 40 MG tablet TAKE 1 TABLET(40 MG) BY MOUTH DAILY   benztropine (COGENTIN) 1 MG tablet Take 1 tablet (1 mg total) by mouth 2 (two) times daily.   carbamazepine (CARBATROL) 100 MG 12 hr capsule Take by mouth at bedtime.   Continuous Blood Gluc Sensor (DEXCOM G7 SENSOR) MISC Change sensor every 10 days   Continuous Blood Gluc Transmit (DEXCOM G6 TRANSMITTER) MISC Use as instructed to check blood sugar. Change every 90  days   denosumab (PROLIA) 60 MG/ML SOSY injection Inject 60 mg into the skin every 6 (six) months.   escitalopram (LEXAPRO) 10 MG tablet Take 10 mg by mouth daily.   Fluticasone-Salmeterol (ADVAIR DISKUS) 500-50 MCG/DOSE AEPB Inhale 1 puff into the lungs in the morning and at bedtime.   furosemide (LASIX) 40 MG tablet Take 1 tablet (40 mg total) by mouth every morning.   Galcanezumab-gnlm (EMGALITY) 120 MG/ML SOAJ One  injection per month   HYDROmorphone (DILAUDID) 2 MG tablet Take 1 tablet (2 mg total) by mouth every 6 (six) hours as needed for severe pain.   hydrOXYzine (VISTARIL) 25 MG capsule Take 1 capsule (25 mg total) by mouth every 8 (eight) hours as needed.   icosapent Ethyl (VASCEPA) 1 g capsule TAKE 2 CAPSULES TWICE A DAY WITH MEALS   insulin glargine, 2 Unit Dial, (TOUJEO MAX SOLOSTAR) 300 UNIT/ML Solostar Pen Inject 68 Units into the skin daily in the afternoon.   insulin lispro (HUMALOG KWIKPEN) 100 UNIT/ML KwikPen Max daily 50 units   Insulin Pen Needle 29G X 12MM MISC 1 Device by Does not apply route daily in the afternoon.   lisinopril (ZESTRIL) 10 MG tablet Take 1 tablet (10 mg total) by mouth daily.   LORazepam (ATIVAN) 1 MG tablet Take 1 tablet (1 mg total) by mouth 2 (two) times daily. And give 2 tablets by mouth at bedtime   metaxalone (SKELAXIN) 800 MG tablet Take 1 tablet (800 mg total) by mouth 3 (three) times daily as needed for muscle spasms.   metFORMIN (GLUCOPHAGE) 500 MG tablet Take 1 tablet (500 mg total) by mouth 2 (two) times daily with a meal. TAKE 1 TABLET(500 MG) BY MOUTH TWICE DAILY AFTER A MEAL   metoprolol succinate (TOPROL XL) 25 MG 24 hr tablet Take 1 tablet (25 mg total) by mouth in the morning and at bedtime.   metoprolol succinate (TOPROL-XL) 100 MG 24 hr tablet TAKE 1 TABLET IN THE MORNING AND AT BEDTIME   naloxone (NARCAN) nasal spray 4 mg/0.1 mL SMARTSIG:Both Nares   NON FORMULARY Diet: _____ Regular, ___  __ NAS, ___x____Consistent  Carbohydrate, _______NPO _____Other   nystatin ointment (MYCOSTATIN) Apply 1 Application topically 2 (two) times daily.   nystatin powder Apply 1 Application topically 3 (three) times daily.   ondansetron (ZOFRAN-ODT) 8 MG disintegrating tablet Take 1 tablet (8 mg total) by mouth every 6 (six) hours as needed for nausea or vomiting.   polyethylene glycol powder (GLYCOLAX/MIRALAX) 17 GM/SCOOP powder Take by mouth as directed.   potassium chloride SA (KLOR-CON M) 20 MEQ tablet Take 1 tablet (20 mEq total) by mouth daily.   pregabalin (LYRICA) 200 MG capsule TAKE 1 CAPSULE(200 MG) BY MOUTH TWICE DAILY   telmisartan (MICARDIS) 20 MG tablet Take by mouth.   tirzepatide (MOUNJARO) 5 MG/0.5ML Pen Inject 5 mg into the skin once a week.   Vitamin D, Cholecalciferol, 25 MCG (1000 UT) TABS Take 1 tablet by mouth daily in the afternoon.   [DISCONTINUED] doxepin (SINEQUAN) 10 MG capsule Take 10 mg by mouth at bedtime.   [DISCONTINUED] fluconazole (DIFLUCAN) 150 MG tablet Take 1 now and 1 in 3 days DO NOT TAKE LIPITOR WHEN TAKING   Facility-Administered Encounter Medications as of 06/04/2022  Medication   bupivacaine-epinephrine (MARCAINE W/ EPI) 0.5% -1:200000 injection     Objective:     PHYSICAL EXAMINATION:    VITALS:   Vitals:   06/04/22 1234  BP: 104/69  Pulse: 68  Resp: 18  SpO2: 98%  Weight: 197 lb (89.4 kg)  Height: _0  (1.575 m)    GEN:  The patient appears stated age and is in NAD. HEENT:  Normocephalic, atraumatic.   Neurological examination:  General: NAD, well-groomed, appears stated age. Orientation: The patient is alert. Oriented to person, place and date Cranial nerves: There is good facial symmetry .The speech is fluent and clear. No aphasia or dysarthria. Fund of knowledge is appropriate.  Recent memory impaired and remote memory is normal.  Attention and concentration are normal.  Able to name objects and repeat phrases.  Hearing is intact to conversational tone.      Sensation: Sensation is intact to light touch throughout Motor: Strength is at least antigravity x4. Tremors: none  DTR's 2/4 in UE/LE      01/26/2019   11:00 AM 08/06/2018   11:00 AM  Mont[facility] Cognitive Assessment   Visuospatial/ Executive (0/5) 2 4  Naming (0/3) 3 3  Attention: Read list of digits (0/2) 1 2  Attention: Read list of letters (0/1) 0 1  Attention: Serial 7 subtraction starting at 100 (0/3) 0 1  Language: Repeat phrase (0/2) 1 1  Language : Fluency (0/1) 0 0  Abstraction (0/2) 1 1  Delayed Recall (0/5) 0 4  Orientation (0/6) 3 6  Total 11 23       11/08/2021    7:00 AM 08/28/2020   11:00 AM 01/18/2018    4:00 PM  MMSE - Mini Mental State Exam  Orientation to time _0 Orientation to Place _1 Registration _2 Attention/ Calculation _3 Recall _4 Language- name 2 objects _5 Language- repeat _6 Language- follow 3 step command _7 Language- read & follow direction _8 Write a sentence _9 Copy design _10 Total score _11 Movement examination: Tone: There is normal tone in the UE/LE Abnormal movements:  no tremor.  No myoclonus.  No asterixis.   Coordination:  There is no decremation with RAM's. Normal finger to nose  Gait and Station: The patient has no difficulty arising out of a deep-seated chair without the use of the hands. The patient's stride length is good.  Gait is cautious and narrow.   Thank you for allowing [place] the opportunity to participate in the care of this nice patient. Please do not hesitate to contact us for any questions or concerns.   Total time spent on today's visit was 36 minutes dedicated to this patient today, preparing to see patient, examining the patient, ordering tests and/or medications and counseling the patient, documenting clinical information in the EHR or other health record, independently interpreting results and communicating results to the patient/family, discussing treatment  and goals, answering patient's questions and coordinating care.  Cc:  [name], MD  [name] 06/04/2022 1:46 PM

## 2022-06-05 ENCOUNTER — Other Ambulatory Visit: Payer: Self-pay | Admitting: Internal Medicine

## 2022-06-05 ENCOUNTER — Other Ambulatory Visit (HOSPITAL_COMMUNITY): Payer: Self-pay

## 2022-06-05 ENCOUNTER — Other Ambulatory Visit: Payer: Self-pay

## 2022-06-05 DIAGNOSIS — F251 Schizoaffective disorder, depressive type: Secondary | ICD-10-CM | POA: Diagnosis not present

## 2022-06-05 DIAGNOSIS — Z794 Long term (current) use of insulin: Secondary | ICD-10-CM

## 2022-06-05 MED ORDER — TOUJEO MAX SOLOSTAR 300 UNIT/ML ~~LOC~~ SOPN: 68.0000 [IU] | PEN_INJECTOR | Freq: Every day | SUBCUTANEOUS | 0 refills | Status: DC

## 2022-06-05 NOTE — Telephone Encounter (Signed)
Patient states that her BS was over 300 last night and she didn't eat anything but she did take her medications. Patient woke up at 5am and BS was still in 300s.  Patient states that right now her BS sugar is 266 and she has eaten and taking medications.

## 2022-06-05 NOTE — Telephone Encounter (Signed)
Insurance faxed requesting additional information on 12/28, which was sent this week. Status is pending. Will update chart notes with determination.

## 2022-06-05 NOTE — Telephone Encounter (Signed)
Patient has not gotten the Toujeo yet and has only been doing the Lantus 34units. I went over medication and sliding scale with the patient and her spouse. She would like to be referred to the diabetic educator .

## 2022-06-06 ENCOUNTER — Other Ambulatory Visit (HOSPITAL_COMMUNITY): Payer: Self-pay

## 2022-06-06 ENCOUNTER — Encounter: Payer: Self-pay | Admitting: Internal Medicine

## 2022-06-06 NOTE — Telephone Encounter (Signed)
Patient states that she needs a additional PA submit by phone for her to get a short supply of the Toujeo from local pharmacy until she can get from mail order. Patient states you have to call provider line and do the PA for this.

## 2022-06-06 NOTE — Telephone Encounter (Signed)
CMA reached out regarding PA status for Mounjaro and Toujeo. Amber Stephenson has been approved till 06/01/2098 and filled today with [facility]. Toujeo was denied because they don't have record of patient using at least 100 units of Lantus daily. I explained the lack of controlled blood sugars on BID Lantus dosing to try and advocate for re-review and they denied the review without documentation of patient trying Lantus at least 100 units daily first. They said if she starts using at least 100 units of lantus daily and she's still uncontrolled at that point, then we can submit for re-review. Please advise.

## 2022-06-08 ENCOUNTER — Encounter (HOSPITAL_COMMUNITY): Payer: Self-pay

## 2022-06-08 ENCOUNTER — Inpatient Hospital Stay (HOSPITAL_BASED_OUTPATIENT_CLINIC_OR_DEPARTMENT_OTHER)
Admission: EM | Admit: 2022-06-08 | Discharge: 2022-06-13 | DRG: 291 | Disposition: A | Discharge status: Home Health | Payer: Medicare Other | Attending: Internal Medicine | Admitting: Internal Medicine

## 2022-06-08 ENCOUNTER — Encounter (HOSPITAL_BASED_OUTPATIENT_CLINIC_OR_DEPARTMENT_OTHER): Payer: Self-pay | Admitting: Emergency Medicine

## 2022-06-08 ENCOUNTER — Emergency Department (HOSPITAL_BASED_OUTPATIENT_CLINIC_OR_DEPARTMENT_OTHER): Payer: Medicare Other

## 2022-06-08 ENCOUNTER — Other Ambulatory Visit: Payer: Self-pay

## 2022-06-08 DIAGNOSIS — K5900 Constipation, unspecified: Secondary | ICD-10-CM | POA: Diagnosis present

## 2022-06-08 DIAGNOSIS — N390 Urinary tract infection, site not specified: Secondary | ICD-10-CM | POA: Diagnosis present

## 2022-06-08 DIAGNOSIS — I5033 Acute on chronic diastolic (congestive) heart failure: Secondary | ICD-10-CM | POA: Diagnosis present

## 2022-06-08 DIAGNOSIS — E1142 Type 2 diabetes mellitus with diabetic polyneuropathy: Secondary | ICD-10-CM

## 2022-06-08 DIAGNOSIS — E1165 Type 2 diabetes mellitus with hyperglycemia: Secondary | ICD-10-CM | POA: Diagnosis present

## 2022-06-08 DIAGNOSIS — Z96611 Presence of right artificial shoulder joint: Secondary | ICD-10-CM | POA: Diagnosis present

## 2022-06-08 DIAGNOSIS — Z7984 Long term (current) use of oral hypoglycemic drugs: Secondary | ICD-10-CM

## 2022-06-08 DIAGNOSIS — Z79899 Other long term (current) drug therapy: Secondary | ICD-10-CM | POA: Diagnosis not present

## 2022-06-08 DIAGNOSIS — Z794 Long term (current) use of insulin: Secondary | ICD-10-CM

## 2022-06-08 DIAGNOSIS — I2489 Other forms of acute ischemic heart disease: Secondary | ICD-10-CM | POA: Diagnosis present

## 2022-06-08 DIAGNOSIS — R0602 Shortness of breath: Secondary | ICD-10-CM | POA: Diagnosis not present

## 2022-06-08 DIAGNOSIS — I10 Essential (primary) hypertension: Secondary | ICD-10-CM | POA: Diagnosis present

## 2022-06-08 DIAGNOSIS — E782 Mixed hyperlipidemia: Secondary | ICD-10-CM | POA: Diagnosis present

## 2022-06-08 DIAGNOSIS — Z7983 Long term (current) use of bisphosphonates: Secondary | ICD-10-CM

## 2022-06-08 DIAGNOSIS — Z7901 Long term (current) use of anticoagulants: Secondary | ICD-10-CM | POA: Diagnosis not present

## 2022-06-08 DIAGNOSIS — K760 Fatty (change of) liver, not elsewhere classified: Secondary | ICD-10-CM | POA: Diagnosis present

## 2022-06-08 DIAGNOSIS — F419 Anxiety disorder, unspecified: Secondary | ICD-10-CM | POA: Diagnosis present

## 2022-06-08 DIAGNOSIS — I509 Heart failure, unspecified: Secondary | ICD-10-CM | POA: Insufficient documentation

## 2022-06-08 DIAGNOSIS — I272 Pulmonary hypertension, unspecified: Secondary | ICD-10-CM | POA: Diagnosis present

## 2022-06-08 DIAGNOSIS — E662 Morbid (severe) obesity with alveolar hypoventilation: Secondary | ICD-10-CM | POA: Diagnosis present

## 2022-06-08 DIAGNOSIS — G43109 Migraine with aura, not intractable, without status migrainosus: Secondary | ICD-10-CM | POA: Diagnosis not present

## 2022-06-08 DIAGNOSIS — R739 Hyperglycemia, unspecified: Secondary | ICD-10-CM | POA: Diagnosis not present

## 2022-06-08 DIAGNOSIS — E871 Hypo-osmolality and hyponatremia: Secondary | ICD-10-CM | POA: Diagnosis present

## 2022-06-08 DIAGNOSIS — I482 Chronic atrial fibrillation, unspecified: Secondary | ICD-10-CM | POA: Diagnosis not present

## 2022-06-08 DIAGNOSIS — J9621 Acute and chronic respiratory failure with hypoxia: Secondary | ICD-10-CM | POA: Diagnosis present

## 2022-06-08 DIAGNOSIS — I110 Hypertensive heart disease with heart failure: Secondary | ICD-10-CM | POA: Diagnosis present

## 2022-06-08 DIAGNOSIS — Z888 Allergy status to other drugs, medicaments and biological substances status: Secondary | ICD-10-CM

## 2022-06-08 DIAGNOSIS — Z818 Family history of other mental and behavioral disorders: Secondary | ICD-10-CM

## 2022-06-08 DIAGNOSIS — I2510 Atherosclerotic heart disease of native coronary artery without angina pectoris: Secondary | ICD-10-CM | POA: Diagnosis present

## 2022-06-08 DIAGNOSIS — E1140 Type 2 diabetes mellitus with diabetic neuropathy, unspecified: Secondary | ICD-10-CM | POA: Diagnosis present

## 2022-06-08 DIAGNOSIS — R0600 Dyspnea, unspecified: Principal | ICD-10-CM

## 2022-06-08 DIAGNOSIS — Z833 Family history of diabetes mellitus: Secondary | ICD-10-CM

## 2022-06-08 DIAGNOSIS — J9601 Acute respiratory failure with hypoxia: Secondary | ICD-10-CM

## 2022-06-08 DIAGNOSIS — G2581 Restless legs syndrome: Secondary | ICD-10-CM | POA: Diagnosis not present

## 2022-06-08 DIAGNOSIS — Z91041 Radiographic dye allergy status: Secondary | ICD-10-CM

## 2022-06-08 DIAGNOSIS — E8809 Other disorders of plasma-protein metabolism, not elsewhere classified: Secondary | ICD-10-CM | POA: Diagnosis present

## 2022-06-08 DIAGNOSIS — F329 Major depressive disorder, single episode, unspecified: Secondary | ICD-10-CM | POA: Diagnosis present

## 2022-06-08 DIAGNOSIS — Z811 Family history of alcohol abuse and dependence: Secondary | ICD-10-CM

## 2022-06-08 DIAGNOSIS — R918 Other nonspecific abnormal finding of lung field: Secondary | ICD-10-CM | POA: Diagnosis not present

## 2022-06-08 DIAGNOSIS — F319 Bipolar disorder, unspecified: Secondary | ICD-10-CM | POA: Diagnosis present

## 2022-06-08 DIAGNOSIS — Z91148 Patient's other noncompliance with medication regimen for other reason: Secondary | ICD-10-CM

## 2022-06-08 DIAGNOSIS — Z91040 Latex allergy status: Secondary | ICD-10-CM

## 2022-06-08 DIAGNOSIS — Z1152 Encounter for screening for COVID-19: Secondary | ICD-10-CM | POA: Diagnosis not present

## 2022-06-08 DIAGNOSIS — G3184 Mild cognitive impairment, so stated: Secondary | ICD-10-CM | POA: Diagnosis present

## 2022-06-08 DIAGNOSIS — R0902 Hypoxemia: Secondary | ICD-10-CM

## 2022-06-08 DIAGNOSIS — M549 Dorsalgia, unspecified: Secondary | ICD-10-CM | POA: Diagnosis present

## 2022-06-08 DIAGNOSIS — J4520 Mild intermittent asthma, uncomplicated: Secondary | ICD-10-CM | POA: Diagnosis present

## 2022-06-08 DIAGNOSIS — Z823 Family history of stroke: Secondary | ICD-10-CM

## 2022-06-08 DIAGNOSIS — Z9841 Cataract extraction status, right eye: Secondary | ICD-10-CM

## 2022-06-08 DIAGNOSIS — F339 Major depressive disorder, recurrent, unspecified: Secondary | ICD-10-CM | POA: Diagnosis not present

## 2022-06-08 DIAGNOSIS — I4821 Permanent atrial fibrillation: Secondary | ICD-10-CM | POA: Diagnosis not present

## 2022-06-08 DIAGNOSIS — Z6834 Body mass index (BMI) 34.0-34.9, adult: Secondary | ICD-10-CM

## 2022-06-08 DIAGNOSIS — Z885 Allergy status to narcotic agent status: Secondary | ICD-10-CM

## 2022-06-08 DIAGNOSIS — J9811 Atelectasis: Secondary | ICD-10-CM | POA: Diagnosis present

## 2022-06-08 DIAGNOSIS — F3112 Bipolar disorder, current episode manic without psychotic features, moderate: Secondary | ICD-10-CM | POA: Diagnosis not present

## 2022-06-08 DIAGNOSIS — Z820 Family history of epilepsy and other diseases of the nervous system: Secondary | ICD-10-CM

## 2022-06-08 DIAGNOSIS — Z8249 Family history of ischemic heart disease and other diseases of the circulatory system: Secondary | ICD-10-CM

## 2022-06-08 HISTORY — DX: Heart failure, unspecified: I50.9

## 2022-06-08 LAB — RESP PANEL BY RT-PCR (RSV, FLU A&B, COVID)  RVPGX2
Influenza A by PCR: NEGATIVE
Influenza B by PCR: NEGATIVE
Resp Syncytial Virus by PCR: NEGATIVE
SARS Coronavirus 2 by RT PCR: NEGATIVE

## 2022-06-08 LAB — CBC
HCT: 41.7 % (ref 36.0–46.0)
HCT: 41.3 % (ref 36.0–46.0)
Hemoglobin: 14 g/dL (ref 12.0–15.0)
Hemoglobin: 13.8 g/dL (ref 12.0–15.0)
MCH: 30.5 pg (ref 26.0–34.0)
MCH: 30.7 pg (ref 26.0–34.0)
MCHC: 33.6 g/dL (ref 30.0–36.0)
MCHC: 33.4 g/dL (ref 30.0–36.0)
MCV: 90.8 fL (ref 80.0–100.0)
MCV: 91.8 fL (ref 80.0–100.0)
Platelets: 191 10*3/uL (ref 150–400)
Platelets: 211 10*3/uL (ref 150–400)
RBC: 4.59 MIL/uL (ref 3.87–5.11)
RBC: 4.5 MIL/uL (ref 3.87–5.11)
RDW: 13.3 % (ref 11.5–15.5)
RDW: 13.3 % (ref 11.5–15.5)
WBC: 11.4 10*3/uL — ABNORMAL HIGH (ref 4.0–10.5)
WBC: 13.5 10*3/uL — ABNORMAL HIGH (ref 4.0–10.5)
nRBC: 0 % (ref 0.0–0.2)
nRBC: 0.1 % (ref 0.0–0.2)

## 2022-06-08 LAB — TROPONIN I (HIGH SENSITIVITY)
Troponin I (High Sensitivity): 14 ng/L (ref <18)
Troponin I (High Sensitivity): 56 ng/L — ABNORMAL HIGH (ref <18)
Troponin I (High Sensitivity): 34 ng/L — ABNORMAL HIGH (ref <18)
Troponin I (High Sensitivity): 29 ng/L — ABNORMAL HIGH (ref <18)

## 2022-06-08 LAB — I-STAT ARTERIAL BLOOD GAS, ED
Acid-base deficit: 3 mmol/L — ABNORMAL HIGH (ref 0.0–2.0)
Bicarbonate: 21.6 mmol/L (ref 20.0–28.0)
Calcium, Ion: 1.29 mmol/L (ref 1.15–1.40)
HCT: 40 % (ref 36.0–46.0)
Hemoglobin: 13.6 g/dL (ref 12.0–15.0)
O2 Saturation: 89 %
Patient temperature: 98
Potassium: 4.1 mmol/L (ref 3.5–5.1)
Sodium: 138 mmol/L (ref 135–145)
TCO2: 23 mmol/L (ref 22–32)
pCO2 arterial: 35.7 mmHg (ref 32–48)
pH, Arterial: 7.389 (ref 7.35–7.45)
pO2, Arterial: 56 mmHg — ABNORMAL LOW (ref 83–108)

## 2022-06-08 LAB — CREATININE, SERUM
Creatinine, Ser: 0.63 mg/dL (ref 0.44–1.00)
GFR, Estimated: >60 mL/min (ref >60)

## 2022-06-08 LAB — D-DIMER, QUANTITATIVE: D-Dimer, Quant: <0.27 ug/mL-FEU (ref 0.00–0.50)

## 2022-06-08 LAB — COMPREHENSIVE METABOLIC PANEL
ALT: 8 U/L (ref 0–44)
AST: 19 U/L (ref 15–41)
Albumin: 4 g/dL (ref 3.5–5.0)
Alkaline Phosphatase: 94 U/L (ref 38–126)
Anion gap: 17 — ABNORMAL HIGH (ref 5–15)
BUN: 14 mg/dL (ref 8–23)
CO2: 18 mmol/L — ABNORMAL LOW (ref 22–32)
Calcium: 9.2 mg/dL (ref 8.9–10.3)
Chloride: 103 mmol/L (ref 98–111)
Creatinine, Ser: 0.59 mg/dL (ref 0.44–1.00)
GFR, Estimated: >60 mL/min (ref >60)
Glucose, Bld: 295 mg/dL — ABNORMAL HIGH (ref 70–99)
Potassium: 4.2 mmol/L (ref 3.5–5.1)
Sodium: 138 mmol/L (ref 135–145)
Total Bilirubin: 0.5 mg/dL (ref 0.3–1.2)
Total Protein: 7 g/dL (ref 6.5–8.1)

## 2022-06-08 LAB — CBG MONITORING, ED: Glucose-Capillary: 217 mg/dL — ABNORMAL HIGH (ref 70–99)

## 2022-06-08 LAB — MAGNESIUM: Magnesium: 1.2 mg/dL — ABNORMAL LOW (ref 1.7–2.4)

## 2022-06-08 LAB — BRAIN NATRIURETIC PEPTIDE: B Natriuretic Peptide: 245.6 pg/mL — ABNORMAL HIGH (ref 0.0–100.0)

## 2022-06-08 MED ORDER — ALBUTEROL SULFATE (2.5 MG/3ML) 0.083% IN NEBU: 2.5000 mg | INHALATION_SOLUTION | Freq: Four times a day (QID) | RESPIRATORY_TRACT | Status: DC | PRN

## 2022-06-08 MED ORDER — LORAZEPAM 1 MG PO TABS
1.0000 mg | ORAL_TABLET | Freq: Two times a day (BID) | ORAL | Status: DC
  Administered 2022-06-08 – 2022-06-09 (×2): 1 mg via ORAL
  Filled 2022-06-08 (×2): qty 1

## 2022-06-08 MED ORDER — ENOXAPARIN SODIUM 40 MG/0.4ML IJ SOSY: 40.0000 mg | PREFILLED_SYRINGE | INTRAMUSCULAR | Status: DC

## 2022-06-08 MED ORDER — IRBESARTAN 150 MG PO TABS: 75.0000 mg | ORAL_TABLET | Freq: Every day | ORAL | Status: DC

## 2022-06-08 MED ORDER — LORAZEPAM 2 MG/ML IJ SOLN
1.0000 mg | Freq: Once | INTRAMUSCULAR | Status: AC
  Administered 2022-06-08: 1 mg via INTRAVENOUS
  Filled 2022-06-08: qty 1

## 2022-06-08 MED ORDER — ACETAMINOPHEN 325 MG PO TABS
650.0000 mg | ORAL_TABLET | ORAL | Status: DC | PRN
  Administered 2022-06-08 – 2022-06-11 (×4): 650 mg via ORAL
  Filled 2022-06-08 (×5): qty 2

## 2022-06-08 MED ORDER — CARBAMAZEPINE ER 100 MG PO TB12
100.0000 mg | ORAL_TABLET | Freq: Every day | ORAL | Status: DC
  Filled 2022-06-08: qty 1

## 2022-06-08 MED ORDER — ICOSAPENT ETHYL 1 G PO CAPS
2.0000 g | ORAL_CAPSULE | Freq: Two times a day (BID) | ORAL | Status: DC
  Administered 2022-06-08 – 2022-06-13 (×10): 2 g via ORAL
  Filled 2022-06-08 (×10): qty 2

## 2022-06-08 MED ORDER — SODIUM CHLORIDE 0.9% FLUSH: 3.0000 mL | INTRAVENOUS | Status: DC | PRN

## 2022-06-08 MED ORDER — CARBAMAZEPINE ER 100 MG PO CP12: 100.0000 mg | ORAL_CAPSULE | Freq: Every day | ORAL | Status: DC

## 2022-06-08 MED ORDER — METOPROLOL SUCCINATE ER 100 MG PO TB24: 100.0000 mg | ORAL_TABLET | Freq: Every day | ORAL | Status: DC

## 2022-06-08 MED ORDER — HYDROMORPHONE HCL 2 MG PO TABS
2.0000 mg | ORAL_TABLET | ORAL | Status: AC
  Administered 2022-06-08: 2 mg via ORAL
  Filled 2022-06-08 (×3): qty 1

## 2022-06-08 MED ORDER — PREGABALIN 75 MG PO CAPS
200.0000 mg | ORAL_CAPSULE | Freq: Two times a day (BID) | ORAL | Status: DC
  Administered 2022-06-08 – 2022-06-13 (×10): 200 mg via ORAL
  Filled 2022-06-08 (×13): qty 1

## 2022-06-08 MED ORDER — MELATONIN 3 MG PO TABS
3.0000 mg | ORAL_TABLET | Freq: Once | ORAL | Status: AC
  Administered 2022-06-09: 3 mg via ORAL
  Filled 2022-06-08: qty 1

## 2022-06-08 MED ORDER — HYDRALAZINE HCL 20 MG/ML IJ SOLN
20.0000 mg | Freq: Once | INTRAMUSCULAR | Status: AC
  Administered 2022-06-08: 20 mg via INTRAVENOUS
  Filled 2022-06-08: qty 1

## 2022-06-08 MED ORDER — ESCITALOPRAM OXALATE 10 MG PO TABS
10.0000 mg | ORAL_TABLET | Freq: Every day | ORAL | Status: DC
  Administered 2022-06-08 – 2022-06-13 (×6): 10 mg via ORAL
  Filled 2022-06-08 (×6): qty 1

## 2022-06-08 MED ORDER — ALBUTEROL SULFATE HFA 108 (90 BASE) MCG/ACT IN AERS: 2.0000 | INHALATION_SPRAY | Freq: Four times a day (QID) | RESPIRATORY_TRACT | Status: DC | PRN

## 2022-06-08 MED ORDER — LISINOPRIL 10 MG PO TABS
10.0000 mg | ORAL_TABLET | Freq: Every day | ORAL | Status: DC
  Administered 2022-06-08: 10 mg via ORAL
  Filled 2022-06-08: qty 1

## 2022-06-08 MED ORDER — ARIPIPRAZOLE 2 MG PO TABS
2.0000 mg | ORAL_TABLET | Freq: Every day | ORAL | Status: DC
  Administered 2022-06-08 – 2022-06-12 (×5): 2 mg via ORAL
  Filled 2022-06-08 (×5): qty 1

## 2022-06-08 MED ORDER — INSULIN ASPART 100 UNIT/ML IV SOLN
6.0000 [IU] | Freq: Once | INTRAVENOUS | Status: AC
  Administered 2022-06-08: 6 [IU] via INTRAVENOUS

## 2022-06-08 MED ORDER — SODIUM CHLORIDE 0.9% FLUSH
3.0000 mL | Freq: Two times a day (BID) | INTRAVENOUS | Status: DC
  Administered 2022-06-08 – 2022-06-12 (×8): 3 mL via INTRAVENOUS

## 2022-06-08 MED ORDER — SODIUM CHLORIDE 0.9 % IV SOLN: 250.0000 mL | INTRAVENOUS | Status: DC | PRN

## 2022-06-08 MED ORDER — APIXABAN 5 MG PO TABS
5.0000 mg | ORAL_TABLET | Freq: Two times a day (BID) | ORAL | Status: DC
  Administered 2022-06-08 – 2022-06-13 (×10): 5 mg via ORAL
  Filled 2022-06-08 (×10): qty 1

## 2022-06-08 MED ORDER — ONDANSETRON HCL 4 MG/2ML IJ SOLN: 4.0000 mg | Freq: Four times a day (QID) | INTRAMUSCULAR | Status: DC | PRN

## 2022-06-08 MED ORDER — HYDROMORPHONE HCL 1 MG/ML IJ SOLN
1.0000 mg | Freq: Once | INTRAMUSCULAR | Status: AC
  Administered 2022-06-08: 1 mg via INTRAVENOUS
  Filled 2022-06-08: qty 1

## 2022-06-08 MED ORDER — INSULIN REGULAR HUMAN 100 UNIT/ML IJ SOLN: 6.0000 [IU] | Freq: Once | INTRAMUSCULAR | Status: DC

## 2022-06-08 MED ORDER — FUROSEMIDE 10 MG/ML IJ SOLN
40.0000 mg | Freq: Two times a day (BID) | INTRAMUSCULAR | Status: DC
  Administered 2022-06-08 – 2022-06-12 (×8): 40 mg via INTRAVENOUS
  Filled 2022-06-08 (×8): qty 4

## 2022-06-08 MED ORDER — METOPROLOL SUCCINATE ER 25 MG PO TB24
125.0000 mg | ORAL_TABLET | Freq: Two times a day (BID) | ORAL | Status: DC
  Administered 2022-06-09 – 2022-06-13 (×9): 125 mg via ORAL
  Filled 2022-06-08 (×9): qty 5

## 2022-06-08 MED ORDER — FUROSEMIDE 10 MG/ML IJ SOLN
60.0000 mg | Freq: Once | INTRAMUSCULAR | Status: AC
  Administered 2022-06-08: 60 mg via INTRAVENOUS
  Filled 2022-06-08: qty 6

## 2022-06-08 MED ORDER — MOMETASONE FURO-FORMOTEROL FUM 200-5 MCG/ACT IN AERO
2.0000 | INHALATION_SPRAY | Freq: Two times a day (BID) | RESPIRATORY_TRACT | Status: DC
  Administered 2022-06-08 – 2022-06-13 (×10): 2 via RESPIRATORY_TRACT
  Filled 2022-06-08: qty 8.8

## 2022-06-08 MED ORDER — ATORVASTATIN CALCIUM 40 MG PO TABS
40.0000 mg | ORAL_TABLET | Freq: Every day | ORAL | Status: DC
  Administered 2022-06-08 – 2022-06-13 (×6): 40 mg via ORAL
  Filled 2022-06-08 (×6): qty 1

## 2022-06-08 NOTE — H&P (Addendum)
History and Physical    Amber Stephenson OJJ:[id number] DOB: 1945/10/07 DOA: 06/08/2022  PCP: [name], MD  Patient coming from: Home  I have personally briefly reviewed patient's old medical records in [place]  Chief Complaint: Shortness of breath  HPI: Amber Stephenson is a 77 y.o. female with medical history significant of hypertension, type 2 diabetes, diabetic neuropathy, hyperlipidemia, chronic A-fib on Eliquis, bipolar 1 disorder, depression, nonobstructive CAD, chronic diastolic CHF, OSA, pulmonary hypertension, migraine headache, NASH, chronic back pain, restless leg syndrome present here with complaining of worsening shortness of breath since 2 days.  Patient reports that she was not taking her Lasix for the past couple of days to avoid frequent urination.  Denies chest pain, leg swelling, orthopnea, PND, cough, congestion, runny nose, recent sick contact, nausea, vomiting, diarrhea, UTI symptoms.  Reports that she has been compliant with her other medications except for Lasix.  Lives with her husband at home.  Independent on daily life activities.  No history of tobacco abuse, licit drug use however drinks alcohol occasionally.  She is not on oxygen at home.  ED Course: Upon arrival to ED: Patient afebrile, pulse: 70, RR: 16, BP 170/111, required 2 L of oxygen via nasal cannula to maintain oxygen saturation of 97%.  Respiratory panel negative.  WBC: 11.4, H&H 14.0/41.7, platelet: 191.  Sodium: 138, potassium: 4.2, BUN: 14, creatinine: 0.59.  BNP: 245.  D-dimer: 1.03.  Troponin: 14--> 56.  Chest x-ray shows mild atelectasis in right base.  Patient was given hydralazine 20 IV once, Lasix 60 IV once and 6 units of insulin along with 1 mg dose of Ativan.  Patient was transferred from [place] to [facility] for further evaluation and management of acute on chronic CHF due to noncompliance with medications.  Review of Systems: As per HPI otherwise negative.    Past Medical  History:  Diagnosis Date   Acute exacerbation of CHF (congestive heart failure) ([place]) 12/13/2020   Acute on chronic diastolic CHF (congestive heart failure)/HFpEF Exacerbation 12/16/2020   Acute on chronic respiratory failure with hypoxia ([place]) 01/22/2019   Acute respiratory failure with hypoxia ([place]) 12/16/2020   Asthma    Atrial fibrillation with RVR 05/19/2017   Bipolar I disorder 01/23/2015   CAD (coronary artery disease)    Nonobstructive; Managed by Dr. Bronson Ing   Cardiomegaly 01/12/2018   Chronic anticoagulation 05/30/2015   Chronic atrial fibrillation ([place]) 01/04/2015   Chronic back pain    Lower back   Chronic diastolic CHF (congestive heart failure) 05/30/2015   Diabetes mellitus, type 2, without complication    Diabetic neuropathy 02/06/2016   Dysrhythmia    A-Fib   Essential hypertension    Herpes genitalis in women 07/16/2015   Hyperlipidemia    Hypoxia 02/01/2019   Insomnia 01/23/2015   Lipoma 02/08/2015   Major depressive disorder    Migraine headache with aura 02/12/2016   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   NAFL (nonalcoholic fatty liver) 93/71/6967   OSA (obstructive sleep apnea) 02/24/2019   10/09/2018 - HST  - AHI 40.6    Pulmonary hypertension    Renal insufficiency    Managed by Dr. Wallace Keller   RLS (restless legs syndrome) 04/27/2015    Past Surgical History:  Procedure Laterality Date   BREAST REDUCTION SURGERY     EYE SURGERY Right    cateracts   HAMMER TOE SURGERY     LEFT HEART CATH AND CORONARY ANGIOGRAPHY N/A 02/03/2018   Procedure: LEFT HEART CATH  AND CORONARY ANGIOGRAPHY;  Surgeon: Martinique, Peter M, MD;  Location: [place] [facility];  Service: Cardiovascular;  Laterality: N/A;   REVERSE SHOULDER ARTHROPLASTY Right 08/19/2019   Procedure: REVERSE SHOULDER ARTHROPLASTY;  Surgeon: Netta Cedars, MD;  Location: [facility];  Service: Orthopedics;  Laterality: Right;  interscalene block   SHOULDER SURGERY Right    "I BROKE MY SHOUDLER   THIGH  SURGERY     "TO REMOVE A TUMOR "     reports that she has never smoked. She has never used smokeless tobacco. She reports that she does not currently use alcohol. She reports that she does not use drugs.  Allergies  Allergen Reactions   Ivp Dye [Iodinated Contrast Media] Anaphylaxis and Swelling    Throat closes    Codeine Other (See Comments)   Iodine Other (See Comments)   Latex     Latex tape pulls skin with it   Tizanidine Other (See Comments)    Weakness, goofy, bad dreams    Family History  Problem Relation Age of Onset   Alzheimer's disease Father    Diabetes Mother    Heart disease Mother    Stroke Brother    Heart disease Brother    Mental illness Brother    Diabetes Brother    Heart disease Sister        CABG   Diabetes Sister    Alcohol abuse Sister     Prior to Admission medications   Medication Sig Start Date End Date Taking? Authorizing Provider  apixaban (ELIQUIS) 5 MG TABS tablet TAKE 1 TABLET(5 MG) BY MOUTH TWICE DAILY 04/30/22  Yes Stacks, Cletus Gash, MD  ARIPiprazole (ABILIFY) 2 MG tablet Take 2 mg by mouth at bedtime. 10/07/21  Yes [provider]  benztropine (COGENTIN) 1 MG tablet Take 1 tablet (1 mg total) by mouth 2 (two) times daily. 12/11/21  Yes Stacks, Cletus Gash, MD  carbamazepine (CARBATROL) 100 MG 12 hr capsule Take by mouth at bedtime. 08/22/20  Yes [provider]  escitalopram (LEXAPRO) 10 MG tablet Take 10 mg by mouth daily. 10/13/21  Yes [provider]  furosemide (LASIX) 40 MG tablet Take 1 tablet (40 mg total) by mouth every morning. 04/03/22  Yes Marylu Lund., NP  HYDROmorphone (DILAUDID) 2 MG tablet Take 1 tablet (2 mg total) by mouth every 6 (six) hours as needed for severe pain. 05/21/22  Yes Bayard Hugger, NP  hydrOXYzine (VISTARIL) 25 MG capsule Take 1 capsule (25 mg total) by mouth every 8 (eight) hours as needed. 03/31/22  Yes Hawks, Christy A, FNP  LORazepam (ATIVAN) 1 MG tablet Take 1 tablet (1 mg  total) by mouth 2 (two) times daily. And give 2 tablets by mouth at bedtime 08/09/20  Yes Gerlene Fee, NP  albuterol (PROVENTIL) (2.5 MG/3ML) 0.083% nebulizer solution Take 3 mLs (2.5 mg total) by nebulization every 6 (six) hours as needed for wheezing or shortness of breath. 08/09/20   Gerlene Fee, NP  albuterol (VENTOLIN HFA) 108 (90 Base) MCG/ACT inhaler Inhale 2 puffs into the lungs every 6 (six) hours as needed for wheezing or shortness of breath. 08/09/20   Gerlene Fee, NP  alendronate (FOSAMAX) 70 MG tablet Take 1 tablet (70 mg total) by mouth every 7 (seven) days. Take with a full glass of water on an empty stomach. Do not lay down for at least 2 hours 04/08/21   [name], MD  atorvastatin (LIPITOR) 40 MG tablet TAKE 1 TABLET(40  MG) BY MOUTH DAILY 04/30/22   [name], MD  Continuous Blood Gluc Sensor (DEXCOM G7 SENSOR) MISC Change sensor every 10 days 05/20/22   Shamleffer, Melanie Crazier, MD  Continuous Blood Gluc Transmit (DEXCOM G6 TRANSMITTER) MISC Use as instructed to check blood sugar. Change every 90 days 05/12/22   Shamleffer, Melanie Crazier, MD  denosumab (PROLIA) 60 MG/ML SOSY injection Inject 60 mg into the skin every 6 (six) months. 08/14/20   [name], MD  Fluticasone-Salmeterol (ADVAIR DISKUS) 500-50 MCG/DOSE AEPB Inhale 1 puff into the lungs in the morning and at bedtime. 08/09/20   Gerlene Fee, NP  Galcanezumab-gnlm [facility]) 120 MG/ML SOAJ One injection per month 05/19/22   Shawn Route, Coralee Pesa, PA-C  icosapent Ethyl (VASCEPA) 1 g capsule TAKE 2 CAPSULES TWICE A DAY WITH MEALS 05/28/22   [name], MD  insulin glargine, 2 Unit Dial, (TOUJEO MAX SOLOSTAR) 300 UNIT/ML Solostar Pen Inject 68 Units into the skin daily in the afternoon. 06/05/22   Shamleffer, Melanie Crazier, MD  insulin lispro (HUMALOG KWIKPEN) 100 UNIT/ML KwikPen Max daily 50 units 05/28/22   Shamleffer, Melanie Crazier, MD  Insulin Pen Needle 29G X 12MM MISC 1 Device by Does not  apply route daily in the afternoon. 05/28/22   Shamleffer, Melanie Crazier, MD  lisinopril (ZESTRIL) 10 MG tablet Take 1 tablet (10 mg total) by mouth daily. 01/14/22   [name], MD  metaxalone (SKELAXIN) 800 MG tablet Take 1 tablet (800 mg total) by mouth 3 (three) times daily as needed for muscle spasms. 03/10/22   [name], MD  metFORMIN (GLUCOPHAGE) 500 MG tablet Take 1 tablet (500 mg total) by mouth 2 (two) times daily with a meal. TAKE 1 TABLET(500 MG) BY MOUTH TWICE DAILY AFTER A MEAL 05/28/22   Shamleffer, Melanie Crazier, MD  metoprolol succinate (TOPROL XL) 25 MG 24 hr tablet Take 1 tablet (25 mg total) by mouth in the morning and at bedtime. 11/13/21   Sueanne Margarita, MD  metoprolol succinate (TOPROL-XL) 100 MG 24 hr tablet TAKE 1 TABLET IN THE MORNING AND AT BEDTIME 11/13/21   Sueanne Margarita, MD  naloxone [facility]) nasal spray 4 mg/0.1 mL SMARTSIG:Both Nares 08/08/21   [provider]  NON FORMULARY Diet: _____ Regular, ___  __ NAS, ___x____Consistent Carbohydrate, _______NPO _____Other 04/24/20   [provider]  nystatin ointment (MYCOSTATIN) Apply 1 Application topically 2 (two) times daily. 12/27/21   Estill Dooms, NP  nystatin powder Apply 1 Application topically 3 (three) times daily. 11/22/21   Shamleffer, Melanie Crazier, MD  ondansetron (ZOFRAN-ODT) 8 MG disintegrating tablet Take 1 tablet (8 mg total) by mouth every 6 (six) hours as needed for nausea or vomiting. 01/13/22   [name], MD  polyethylene glycol powder (GLYCOLAX/MIRALAX) 17 GM/SCOOP powder Take by mouth as directed. 02/26/22   [provider]  potassium chloride SA (KLOR-CON M) 20 MEQ tablet Take 1 tablet (20 mEq total) by mouth daily. 01/27/22   [name], MD  pregabalin (LYRICA) 200 MG capsule TAKE 1 CAPSULE(200 MG) BY MOUTH TWICE DAILY 03/27/22   Lovorn, Jinny Blossom, MD  telmisartan (MICARDIS) 20 MG tablet Take by mouth. 05/19/22   [provider]  tirzepatide  Darcel Bayley) 5 MG/0.5ML Pen Inject 5 mg into the skin once a week. 05/28/22   Shamleffer, Melanie Crazier, MD  Vitamin D, Cholecalciferol, 25 MCG (1000 UT) TABS Take 1 tablet by mouth daily in the afternoon. 07/26/20   [provider]    Physical Exam:  Vitals:   06/08/22 1300 06/08/22 1458 06/08/22 1557 06/08/22 1617  BP: (!) 148/87 (!) 142/106 (!) 120/59   Pulse: 85 (!) 102 98   Resp: (!) 21 (!) 26 16   Temp:  98.6 F (37 C) 98.2 F (36.8 C)   TempSrc:  Oral Oral   SpO2: 98% 92% 96%   Weight:    86.2 kg  Height:    '5\' 2"'$  (1.575 m)    Constitutional: In mild respiratory distress, on nasal cannula, communicating well Eyes: PERRL, lids and conjunctivae normal ENMT: Mucous membranes are moist. Posterior pharynx clear of any exudate or lesions.Normal dentition.  Neck: normal, supple, no masses, no thyromegaly Respiratory: clear to auscultation bilaterally, no wheezing, no crackles. Normal respiratory effort.  Increase work of breathing  cardiovascular: Regular rate and rhythm, no murmurs / rubs / gallops. No extremity edema. 2+ pedal pulses. No carotid bruits.  Abdomen: no tenderness, no masses palpated. No hepatosplenomegaly. Bowel sounds positive.  Musculoskeletal: no clubbing / cyanosis. No joint deformity upper and lower extremities. Good ROM, no contractures. Normal muscle tone.  Skin: no rashes, lesions, ulcers. No induration Neurologic: CN 2-12 grossly intact. Sensation intact, DTR normal. Strength 5/5 in all 4.  Psychiatric: Normal judgment and insight. Alert and oriented x 3. Normal mood.    Labs on Admission: I have personally reviewed following labs and imaging studies  CBC: Recent Labs  Lab 06/08/22 0759 06/08/22 0851  WBC 11.4*  --   HGB 14.0 13.6  HCT 41.7 40.0  MCV 90.8  --   PLT 191  --    Basic Metabolic Panel: Recent Labs  Lab 06/08/22 0759 06/08/22 0851  NA 138 138  K 4.2 4.1  CL 103  --   CO2 18*  --   GLUCOSE 295*  --   BUN 14  --    CREATININE 0.59  --   CALCIUM 9.2  --    GFR: Estimated Creatinine Clearance: 61.9 mL/min (by C-G formula based on SCr of 0.59 mg/dL). Liver Function Tests: Recent Labs  Lab 06/08/22 0759  AST 19  ALT 8  ALKPHOS 94  BILITOT 0.5  PROT 7.0  ALBUMIN 4.0   No results for input(s): "LIPASE", "AMYLASE" in the last 168 hours. No results for input(s): "AMMONIA" in the last 168 hours. Coagulation Profile: No results for input(s): "INR", "PROTIME" in the last 168 hours. Cardiac Enzymes: No results for input(s): "CKTOTAL", "CKMB", "CKMBINDEX", "TROPONINI" in the last 168 hours. BNP (last 3 results) No results for input(s): "PROBNP" in the last 8760 hours. HbA1C: No results for input(s): "HGBA1C" in the last 72 hours. CBG: Recent Labs  Lab 06/08/22 1133  GLUCAP 217*   Lipid Profile: No results for input(s): "CHOL", "HDL", "LDLCALC", "TRIG", "CHOLHDL", "LDLDIRECT" in the last 72 hours. Thyroid Function Tests: No results for input(s): "TSH", "T4TOTAL", "FREET4", "T3FREE", "THYROIDAB" in the last 72 hours. Anemia Panel: No results for input(s): "VITAMINB12", "FOLATE", "FERRITIN", "TIBC", "IRON", "RETICCTPCT" in the last 72 hours. Urine analysis:    Component Value Date/Time   COLORURINE STRAW (A) 12/13/2020 1833   APPEARANCEUR Clear 03/26/2022 0815   LABSPEC 1.005 12/13/2020 1833   PHURINE 8.0 12/13/2020 1833   GLUCOSEU Negative 03/26/2022 0815   HGBUR NEGATIVE 12/13/2020 1833   BILIRUBINUR Negative 03/26/2022 0815   KETONESUR NEGATIVE 12/13/2020 1833   PROTEINUR 1+ (A) 03/26/2022 0815   PROTEINUR NEGATIVE 12/13/2020 1833   UROBILINOGEN 0.2 07/23/2015 1350   NITRITE Negative 03/26/2022 0815   NITRITE NEGATIVE 12/13/2020  [place] (A) 03/26/2022 0815   LEUKOCYTESUR NEGATIVE 12/13/2020 1833    Radiological Exams on Admission: DG Chest Port 1 View  Result Date: 06/08/2022 CLINICAL DATA:  Shortness of breath EXAM: PORTABLE CHEST 1 VIEW COMPARISON:  November 11, 2021 FINDINGS: Stable cardiomegaly. The hila and mediastinum are stable. No pneumothorax. No suspicious nodules, masses, or focal infiltrates. Mild atelectasis in the right base. IMPRESSION: Mild atelectasis in the right base. Electronically Signed   By: Dorise Bullion III M.D.   On: 06/08/2022 08:26    EKG: Independently reviewed.  A-fib, left axis deviation.  Cannot rule out anterior infarct.  Assessment/Plan  Acute hypoxemic respiratory failure in the setting of acute on chronic diastolic CHF: -Secondary to noncompliance with medications. -Patient is currently requiring 2 L of oxygen. -Received Lasix 60 IV once in ED.  Continue Lasix 40 IV twice daily.  Chest x-ray negative.  BNP elevated. -COVID, flu, RSV all negative. -Strict INO's and daily weight. -Will repeat transthoracic echo  Hypertension: Elevated upon arrival.  Now improving. -Will continue home meds lisinopril, metoprolol.  Continue to monitor blood pressure closely.  Hypercholesteremia: Continue atorvastatin, Vascepa  Depression with anxiety and history of bipolar disorder:  -Continue home medications -Abilify 5, Lexapro, hydroxyzine, Ativan -Followed by psychiatry outpatient  Leukocytosis: Appears to be chronic.  Patient is afebrile.  Chest x-ray negative for pneumonia.   -Will hold off antibiotics at this time  Type 2 diabetes: Check A1c.  Hold metformin, home insulin lispro, Toujeo and Mounjaro. -Start sliding scale insulin.  Monitor blood sugar closely.  Permanent A-fib: -Rate controlled.  Continue metoprolol and Eliquis -Monitor on telemetry  Diabetic neuropathy: Continue Lyrica  Mild intermittent asthma: No wheezing noted on exam.  Will continue home inhalers.  Coronary artery disease: Elevated troponin: -Early demand ischemia in the setting of fluid overload.  Patient denies chest pain.  Will trend troponin. -Continue metoprolol, lisinopril, statins -Followed by cardiology Dr. Radford Pax  outpatient  Obesity with BMI of 34: -Diet modification, exercise and weight loss recommended  History of OSA: Not on CPAP  Mild cognitive impairment: -Followed by neurology outpatient. -Patient has a scheduled neuropsychological evaluation on 08/08/2022 for clarity of diagnosis and disease progression.  Migraine headache: -Will continue home medications  Chronic back pain: -On opioids at home.  DVT prophylaxis: Eliquis Code Status: Full code-confirmed with the patient Family Communication: None present at bedside.  Plan of care discussed with patient in length and she verbalized understanding and agreed with it. Disposition Plan: Home Consults called: None Admission status: NP and   Mckinley Jewel MD [facility]  If 7PM-7AM, please contact night-coverage [email or web address]  06/08/2022, 4:34 PM

## 2022-06-08 NOTE — Progress Notes (Signed)
Plan of Care Note for accepted transfer   Patient: Amber Stephenson MRN: [id number]   DOA: 06/08/2022  Facility requesting transfer: [facility] Requesting Provider: Dr. Jeanell Sparrow Reason for transfer: acute  Facility course: 77 yo F with PMHx of MCI, DM2, chronic pain, chronic diastolic HF, PAF. Presenting with shortness of breath x 2 days. Apparently she has been non-compliant on her lasix for the past week. W/u revealed hypoxia, volume overload. She was started on lasix and supplemental O2.   Plan of care: The patient is accepted for admission to Telemetry unit, at [facility]..  While holding at [facility], medical decision making responsibilities remain with the EDP. Upon arrival to [facility], [facility] will assume care. Thank you.   Author: Jonnie Finner, DO 06/08/2022  Check [email or web address] for on-call coverage.  Nursing staff, Please call [place] number on Amion as soon as patient's arrival, so appropriate admitting provider can evaluate the pt.

## 2022-06-08 NOTE — ED Notes (Signed)
Patient taken off bedpan.

## 2022-06-08 NOTE — ED Notes (Signed)
This entire visit pt. Has been anxious and agitated. Her husband remains with her and is very supportive. I give reassurance; plus she has had IV ativan twice. She is beginning to calm down now.

## 2022-06-08 NOTE — ED Triage Notes (Signed)
Pt has 2 day hx of having hard time catching her breath . Pt states she has heart failure ,has not been taking her lasix like she should because it causes her to pee too much, no edema noted. Lungs are clear. Pt states I am anxious

## 2022-06-08 NOTE — ED Provider Notes (Signed)
[facility] EMERGENCY DEPT Provider Note   CSN: [id number] Arrival date & time: 06/08/22  0745     History  Chief Complaint  Patient presents with   Shortness of Breath    Amber Stephenson is a 77 y.o. female.  HPI 77 year old female history of chronic A-fib, hypertension, long-term anticoagulant use, diabetes, congestive heart failure, bipolar disorder, presents today with decreased Lasix use and increased dyspnea over the past 2 to 3 days.  Patient is complaining of difficulty catching her breath.  She feels like her CHF in the past.  She denies any chest pain, fever, cough.  She has had some nasal congestion.  She has had no known sick contacts.  Has felt hot and cold but denies having actual fever.  She reports taking her medications except for the Lasix regularly.  She has decreased her Lasix intake due to need to urinate frequently. She denies history of DVT or PE    Home Medications Prior to Admission medications   Medication Sig Start Date End Date Taking? Authorizing Provider  apixaban (ELIQUIS) 5 MG TABS tablet TAKE 1 TABLET(5 MG) BY MOUTH TWICE DAILY 04/30/22  Yes Stacks, Cletus Gash, MD  ARIPiprazole (ABILIFY) 2 MG tablet Take 2 mg by mouth at bedtime. 10/07/21  Yes [provider]  benztropine (COGENTIN) 1 MG tablet Take 1 tablet (1 mg total) by mouth 2 (two) times daily. 12/11/21  Yes Stacks, Cletus Gash, MD  carbamazepine (CARBATROL) 100 MG 12 hr capsule Take by mouth at bedtime. 08/22/20  Yes [provider]  escitalopram (LEXAPRO) 10 MG tablet Take 10 mg by mouth daily. 10/13/21  Yes [provider]  furosemide (LASIX) 40 MG tablet Take 1 tablet (40 mg total) by mouth every morning. 04/03/22  Yes Marylu Lund., NP  HYDROmorphone (DILAUDID) 2 MG tablet Take 1 tablet (2 mg total) by mouth every 6 (six) hours as needed for severe pain. 05/21/22  Yes Bayard Hugger, NP  hydrOXYzine (VISTARIL) 25 MG capsule Take 1 capsule (25 mg total) by  mouth every 8 (eight) hours as needed. 03/31/22  Yes Hawks, Christy A, FNP  LORazepam (ATIVAN) 1 MG tablet Take 1 tablet (1 mg total) by mouth 2 (two) times daily. And give 2 tablets by mouth at bedtime 08/09/20  Yes Gerlene Fee, NP  albuterol (PROVENTIL) (2.5 MG/3ML) 0.083% nebulizer solution Take 3 mLs (2.5 mg total) by nebulization every 6 (six) hours as needed for wheezing or shortness of breath. 08/09/20   Gerlene Fee, NP  albuterol (VENTOLIN HFA) 108 (90 Base) MCG/ACT inhaler Inhale 2 puffs into the lungs every 6 (six) hours as needed for wheezing or shortness of breath. 08/09/20   Gerlene Fee, NP  alendronate (FOSAMAX) 70 MG tablet Take 1 tablet (70 mg total) by mouth every 7 (seven) days. Take with a full glass of water on an empty stomach. Do not lay down for at least 2 hours 04/08/21   [name], MD  atorvastatin (LIPITOR) 40 MG tablet TAKE 1 TABLET(40 MG) BY MOUTH DAILY 04/30/22   [name], MD  Continuous Blood Gluc Sensor (DEXCOM G7 SENSOR) MISC Change sensor every 10 days 05/20/22   Shamleffer, Melanie Crazier, MD  Continuous Blood Gluc Transmit (DEXCOM G6 TRANSMITTER) MISC Use as instructed to check blood sugar. Change every 90 days 05/12/22   Shamleffer, Melanie Crazier, MD  denosumab (PROLIA) 60 MG/ML SOSY injection Inject 60 mg into the skin every 6 (six) months. 08/14/20   [name],  MD  Fluticasone-Salmeterol (ADVAIR DISKUS) 500-50 MCG/DOSE AEPB Inhale 1 puff into the lungs in the morning and at bedtime. 08/09/20   Gerlene Fee, NP  Galcanezumab-gnlm [facility]) 120 MG/ML SOAJ One injection per month 05/19/22   Shawn Route, Coralee Pesa, PA-C  icosapent Ethyl (VASCEPA) 1 g capsule TAKE 2 CAPSULES TWICE A DAY WITH MEALS 05/28/22   [name], MD  insulin glargine, 2 Unit Dial, (TOUJEO MAX SOLOSTAR) 300 UNIT/ML Solostar Pen Inject 68 Units into the skin daily in the afternoon. 06/05/22   Shamleffer, Melanie Crazier, MD  insulin lispro (HUMALOG KWIKPEN) 100 UNIT/ML  KwikPen Max daily 50 units 05/28/22   Shamleffer, Melanie Crazier, MD  Insulin Pen Needle 29G X 12MM MISC 1 Device by Does not apply route daily in the afternoon. 05/28/22   Shamleffer, Melanie Crazier, MD  lisinopril (ZESTRIL) 10 MG tablet Take 1 tablet (10 mg total) by mouth daily. 01/14/22   [name], MD  metaxalone (SKELAXIN) 800 MG tablet Take 1 tablet (800 mg total) by mouth 3 (three) times daily as needed for muscle spasms. 03/10/22   [name], MD  metFORMIN (GLUCOPHAGE) 500 MG tablet Take 1 tablet (500 mg total) by mouth 2 (two) times daily with a meal. TAKE 1 TABLET(500 MG) BY MOUTH TWICE DAILY AFTER A MEAL 05/28/22   Shamleffer, Melanie Crazier, MD  metoprolol succinate (TOPROL XL) 25 MG 24 hr tablet Take 1 tablet (25 mg total) by mouth in the morning and at bedtime. 11/13/21   Sueanne Margarita, MD  metoprolol succinate (TOPROL-XL) 100 MG 24 hr tablet TAKE 1 TABLET IN THE MORNING AND AT BEDTIME 11/13/21   Sueanne Margarita, MD  naloxone [facility]) nasal spray 4 mg/0.1 mL SMARTSIG:Both Nares 08/08/21   [provider]  NON FORMULARY Diet: _____ Regular, ___  __ NAS, ___x____Consistent Carbohydrate, _______NPO _____Other 04/24/20   [provider]  nystatin ointment (MYCOSTATIN) Apply 1 Application topically 2 (two) times daily. 12/27/21   Estill Dooms, NP  nystatin powder Apply 1 Application topically 3 (three) times daily. 11/22/21   Shamleffer, Melanie Crazier, MD  ondansetron (ZOFRAN-ODT) 8 MG disintegrating tablet Take 1 tablet (8 mg total) by mouth every 6 (six) hours as needed for nausea or vomiting. 01/13/22   [name], MD  polyethylene glycol powder (GLYCOLAX/MIRALAX) 17 GM/SCOOP powder Take by mouth as directed. 02/26/22   [provider]  potassium chloride SA (KLOR-CON M) 20 MEQ tablet Take 1 tablet (20 mEq total) by mouth daily. 01/27/22   [name], MD  pregabalin (LYRICA) 200 MG capsule TAKE 1 CAPSULE(200 MG) BY MOUTH TWICE DAILY  03/27/22   Lovorn, Jinny Blossom, MD  telmisartan (MICARDIS) 20 MG tablet Take by mouth. 05/19/22   [provider]  tirzepatide Darcel Bayley) 5 MG/0.5ML Pen Inject 5 mg into the skin once a week. 05/28/22   Shamleffer, Melanie Crazier, MD  Vitamin D, Cholecalciferol, 25 MCG (1000 UT) TABS Take 1 tablet by mouth daily in the afternoon. 07/26/20   [provider]      Allergies    Ivp dye [iodinated contrast media], Codeine, Iodine, Latex, and Tizanidine    Review of Systems   Review of Systems  Physical Exam Updated Vital Signs BP (!) 149/86   Pulse 80   Resp (!) 35   SpO2 98%  Physical Exam Vitals and nursing note reviewed.  Constitutional:      General: She is in acute distress.     Appearance: She is well-developed. She is obese. She  is ill-appearing.  HENT:     Head: Normocephalic.     Mouth/Throat:     Pharynx: Oropharynx is clear.  Eyes:     Pupils: Pupils are equal, round, and reactive to light.  Cardiovascular:     Rate and Rhythm: Tachycardia present. Rhythm irregular.  Pulmonary:     Effort: Pulmonary effort is normal.     Breath sounds: Decreased breath sounds present.  Abdominal:     General: Bowel sounds are normal.     Palpations: Abdomen is soft.  Musculoskeletal:        General: Normal range of motion.     Cervical back: Normal range of motion and neck supple.     Right lower leg: No tenderness. No edema.     Left lower leg: No tenderness. No edema.  Skin:    General: Skin is warm.     Capillary Refill: Capillary refill takes less than 2 seconds.  Neurological:     General: No focal deficit present.     Mental Status: She is alert.  Psychiatric:        Mood and Affect: Mood normal.     ED Results / Procedures / Treatments   Labs (all labs ordered are listed, but only abnormal results are displayed) Labs Reviewed  CBC - Abnormal; Notable for the following components:      Result Value   WBC 11.4 (*)    All other components within normal  limits  COMPREHENSIVE METABOLIC PANEL - Abnormal; Notable for the following components:   CO2 18 (*)    Glucose, Bld 295 (*)    Anion gap 17 (*)    All other components within normal limits  BRAIN NATRIURETIC PEPTIDE - Abnormal; Notable for the following components:   B Natriuretic Peptide 245.6 (*)    All other components within normal limits  I-STAT ARTERIAL BLOOD GAS, ED - Abnormal; Notable for the following components:   pO2, Arterial 56 (*)    Acid-base deficit 3.0 (*)    All other components within normal limits  RESP PANEL BY RT-PCR (RSV, FLU A&B, COVID)  RVPGX2  D-DIMER, QUANTITATIVE  TROPONIN I (HIGH SENSITIVITY)    EKG EKG Interpretation  Date/Time:  Sunday June 08 2022 07:53:09 EST Ventricular Rate:  94 PR Interval:    QRS Duration: 74 QT Interval:  360 QTC Calculation: 450 R Axis:   -49 Text Interpretation: Atrial fibrillation Left axis deviation Cannot rule out Anterior infarct , age undetermined Abnormal ECG When compared with ECG of 13-Dec-2020 16:05, PREVIOUS ECG IS PRESENT Confirmed by Pattricia Boss [phone]) on 06/08/2022 8:11:49 AM  Radiology DG Chest Port 1 View  Result Date: 06/08/2022 CLINICAL DATA:  Shortness of breath EXAM: PORTABLE CHEST 1 VIEW COMPARISON:  November 11, 2021 FINDINGS: Stable cardiomegaly. The hila and mediastinum are stable. No pneumothorax. No suspicious nodules, masses, or focal infiltrates. Mild atelectasis in the right base. IMPRESSION: Mild atelectasis in the right base. Electronically Signed   By: Dorise Bullion III M.D.   On: 06/08/2022 08:26    Procedures .Critical Care  Performed by: Pattricia Boss, MD Authorized by: Pattricia Boss, MD   Critical care provider statement:    Critical care time (minutes):  65   Critical care was necessary to treat or prevent imminent or life-threatening deterioration of the following conditions:  Respiratory failure   Critical care was time spent personally by me on the following activities:   Development of treatment plan with patient or surrogate, discussions  with consultants, evaluation of patient's response to treatment, examination of patient, ordering and review of laboratory studies, ordering and review of radiographic studies, ordering and performing treatments and interventions, pulse oximetry, re-evaluation of patient's condition and review of old charts     Medications Ordered in ED Medications  HYDROmorphone (DILAUDID) tablet 2 mg (has no administration in time range)  insulin regular (NOVOLIN R) 100 units/mL injection 6 Units (has no administration in time range)  furosemide (LASIX) injection 60 mg (60 mg Intravenous Given 06/08/22 0906)  LORazepam (ATIVAN) injection 1 mg (1 mg Intravenous Given 06/08/22 0951)  hydrALAZINE (APRESOLINE) injection 20 mg (20 mg Intravenous Given 06/08/22 0953)    ED Course/ Medical Decision Making/ A&P Clinical Course as of 06/08/22 1051  Sun Jun 08, 2022  1033 ABG on room air pH 7.38 pCO2 35 pO2 56 showing some hypoxia [DR]  1033 COVID, flu, RSV are negative [DR]  1033 BNP is elevated at 245 [DR]  1033 First troponin is 14 [DR]  1034 D-dimer is less than 0.27 and is normal making PE much less likely [DR]  1034 Glucose elevated at 295 with CO2 18 Anion gap is 17 [DR]    Clinical Course User Index [DR] Pattricia Boss, MD                           Medical Decision Making Amount and/or Complexity of Data Reviewed Labs: ordered. Radiology: ordered.  Risk OTC drugs. Prescription drug management.   This patient presents to the ED for concern of dyspnea, this involves an extensive number of treatment options, and is a complaint that carries with it a high risk of complications and morbidity.  The differential diagnosis includes congestive heart failure, infection including pneumonia and viral infections including COVID and flu, coronary artery syndrome, PE, noncompliance with medications and hypertension.    Co morbidities that  complicate the patient evaluation  Atrial fibrillation, anticoagulation, chronic diastolic heart failure, diabetes, morbid obesity and obstructive sleep apnea   Additional history obtained:  Additional history obtained from patient without history of lung disease and is not currently on any oxygen therapy at home.  She has only taken her Lasix 3 out of 7 days recently. External records from outside source obtained and reviewed including last echo reviewed from December 14, 2020 and patient had 65 to 70% EF of her left ventricle with moderate left ventricular hypertrophy and left ventricular diastolic parameters indeterminate with inferior vena cava dilated to greater than 50% with respiratory variability suggesting right atrial pressure greater than 8 mm, and right ventricular systolic function low normal.   Lab Tests:  I Ordered, and personally interpreted labs.  The pertinent results include: CBC which shows mild leukocytosis 11,400 with normal hemoglobin Complete metabolic panel significant for glucose elevated at 295 and anion gap of 17, pH is normal, will give IV insulin and will need to have anion gap followed BNP is elevated at 245 consistent with some CHF D-dimer is normal making PE unlikely Patient is not having chest pain troponin was checked due to her history of CHF and is 14   Imaging Studies ordered:  I ordered imaging studies including chest x-Hadrian Yarbrough with some increased markings and mild atelectasis in the right base radiologist interpretation notes atelectasis I independently visualized and interpreted imaging which showed increased markings I agree with the radiologist interpretation   Cardiac Monitoring: / EKG:  The patient was maintained on a cardiac monitor.  I personally  viewed and interpreted the cardiac monitored which showed an underlying rhythm of: A-fib/flutter without acute ischemic changes and rate controlled at 94   Consultations Obtained:  I requested  consultation with the hospitalist,  and discussed lab and imaging findings as well as pertinent plan - they recommend: Dr.Kyle who will admit   Problem List / ED Course / Critical interventions / Medication management  Hypoxic respiratory failure Hyperglycemia with elevated anion gap consider DKA but pH normal Chronic narcotic and benzodiazepine use Noncompliance with medication regimen I ordered medication including Lasix for volume overload Reevaluation of the patient after these medicines showed that the patient improved I have reviewed the patients home medicines and have made adjustments as needed   Social Determinants of Health:  Patient is on multiple medications including Ativan and Dilaudid per her report   Test / Admission - Considered:  Patient presents today with dyspnea and sats in the 80s.  Patient is not currently on oxygen at home and has new oxygen requirement.  Patient is treated here in the ED with Lasix.  She is stable here on 4 L of oxygen nasal cannula.        Final Clinical Impression(s) / ED Diagnoses Final diagnoses:  Acute dyspnea  Hypoxia  Noncompliance with medications  Hyperglycemia    Rx / DC Orders ED Discharge Orders     None         Pattricia Boss, MD 06/08/22 1051

## 2022-06-08 NOTE — ED Notes (Signed)
Called [facility] and spoke to [place]; informed that the patient Bed Assignment is READY

## 2022-06-08 NOTE — ED Notes (Signed)
I have just phoned report to [employer], Therapist, sports at [facility].

## 2022-06-08 NOTE — ED Notes (Signed)
[facility] is prepping her for transfer as I write this. She tells me she is "much more comfortable".

## 2022-06-09 ENCOUNTER — Inpatient Hospital Stay (HOSPITAL_COMMUNITY): Payer: Medicare Other

## 2022-06-09 DIAGNOSIS — F3112 Bipolar disorder, current episode manic without psychotic features, moderate: Secondary | ICD-10-CM | POA: Diagnosis not present

## 2022-06-09 DIAGNOSIS — I482 Chronic atrial fibrillation, unspecified: Secondary | ICD-10-CM

## 2022-06-09 DIAGNOSIS — I2510 Atherosclerotic heart disease of native coronary artery without angina pectoris: Secondary | ICD-10-CM

## 2022-06-09 DIAGNOSIS — E1142 Type 2 diabetes mellitus with diabetic polyneuropathy: Secondary | ICD-10-CM

## 2022-06-09 DIAGNOSIS — I10 Essential (primary) hypertension: Secondary | ICD-10-CM

## 2022-06-09 DIAGNOSIS — G43109 Migraine with aura, not intractable, without status migrainosus: Secondary | ICD-10-CM

## 2022-06-09 DIAGNOSIS — E662 Morbid (severe) obesity with alveolar hypoventilation: Secondary | ICD-10-CM

## 2022-06-09 DIAGNOSIS — F339 Major depressive disorder, recurrent, unspecified: Secondary | ICD-10-CM

## 2022-06-09 DIAGNOSIS — I5033 Acute on chronic diastolic (congestive) heart failure: Secondary | ICD-10-CM | POA: Diagnosis not present

## 2022-06-09 DIAGNOSIS — J9601 Acute respiratory failure with hypoxia: Secondary | ICD-10-CM

## 2022-06-09 DIAGNOSIS — E782 Mixed hyperlipidemia: Secondary | ICD-10-CM

## 2022-06-09 LAB — BASIC METABOLIC PANEL
Anion gap: 8 (ref 5–15)
BUN: 19 mg/dL (ref 8–23)
CO2: 27 mmol/L (ref 22–32)
Calcium: 8.9 mg/dL (ref 8.9–10.3)
Chloride: 102 mmol/L (ref 98–111)
Creatinine, Ser: 0.75 mg/dL (ref 0.44–1.00)
GFR, Estimated: >60 mL/min (ref >60)
Glucose, Bld: 121 mg/dL — ABNORMAL HIGH (ref 70–99)
Potassium: 3.9 mmol/L (ref 3.5–5.1)
Sodium: 137 mmol/L (ref 135–145)

## 2022-06-09 LAB — URINALYSIS, ROUTINE W REFLEX MICROSCOPIC
Bilirubin Urine: NEGATIVE
Glucose, UA: NEGATIVE mg/dL
Ketones, ur: NEGATIVE mg/dL
Nitrite: NEGATIVE
Protein, ur: NEGATIVE mg/dL
RBC / HPF: >50 RBC/hpf — ABNORMAL HIGH (ref 0–5)
Specific Gravity, Urine: 1.009 (ref 1.005–1.030)
Trans Epithel, UA: <1
pH: 5 (ref 5.0–8.0)

## 2022-06-09 LAB — ECHOCARDIOGRAM COMPLETE
Area-P 1/2: 4.49 cm2
Height: 62 in
S' Lateral: 2.8 cm
Weight: 3111.13 oz

## 2022-06-09 LAB — CBC WITH DIFFERENTIAL/PLATELET
Abs Immature Granulocytes: 0.06 10*3/uL (ref 0.00–0.07)
Basophils Absolute: 0.1 10*3/uL (ref 0.0–0.1)
Basophils Relative: 0 %
Eosinophils Absolute: 0.1 10*3/uL (ref 0.0–0.5)
Eosinophils Relative: 1 %
HCT: 43.9 % (ref 36.0–46.0)
Hemoglobin: 14 g/dL (ref 12.0–15.0)
Immature Granulocytes: 1 %
Lymphocytes Relative: 27 %
Lymphs Abs: 3.3 10*3/uL (ref 0.7–4.0)
MCH: 30.5 pg (ref 26.0–34.0)
MCHC: 31.9 g/dL (ref 30.0–36.0)
MCV: 95.6 fL (ref 80.0–100.0)
Monocytes Absolute: 0.8 10*3/uL (ref 0.1–1.0)
Monocytes Relative: 7 %
Neutro Abs: 7.7 10*3/uL (ref 1.7–7.7)
Neutrophils Relative %: 64 %
Platelets: 221 10*3/uL (ref 150–400)
RBC: 4.59 MIL/uL (ref 3.87–5.11)
RDW: 13.8 % (ref 11.5–15.5)
WBC: 12 10*3/uL — ABNORMAL HIGH (ref 4.0–10.5)
nRBC: 0 % (ref 0.0–0.2)

## 2022-06-09 LAB — GLUCOSE, CAPILLARY
Glucose-Capillary: 260 mg/dL — ABNORMAL HIGH (ref 70–99)
Glucose-Capillary: 270 mg/dL — ABNORMAL HIGH (ref 70–99)
Glucose-Capillary: 242 mg/dL — ABNORMAL HIGH (ref 70–99)

## 2022-06-09 LAB — MAGNESIUM: Magnesium: 1.6 mg/dL — ABNORMAL LOW (ref 1.7–2.4)

## 2022-06-09 MED ORDER — LORAZEPAM 1 MG PO TABS: 1.0000 mg | ORAL_TABLET | Freq: Once | ORAL | Status: DC

## 2022-06-09 MED ORDER — POLYETHYLENE GLYCOL 3350 17 G PO PACK: 17.0000 g | PACK | Freq: Every day | ORAL | Status: DC | PRN

## 2022-06-09 MED ORDER — MAGNESIUM SULFATE 4 GM/100ML IV SOLN
4.0000 g | Freq: Once | INTRAVENOUS | Status: AC
  Administered 2022-06-09: 4 g via INTRAVENOUS
  Filled 2022-06-09: qty 100

## 2022-06-09 MED ORDER — SENNOSIDES-DOCUSATE SODIUM 8.6-50 MG PO TABS
1.0000 | ORAL_TABLET | Freq: Two times a day (BID) | ORAL | Status: DC
  Administered 2022-06-09 – 2022-06-11 (×5): 1 via ORAL
  Filled 2022-06-09 (×5): qty 1

## 2022-06-09 MED ORDER — PROCHLORPERAZINE EDISYLATE 10 MG/2ML IJ SOLN
5.0000 mg | Freq: Once | INTRAMUSCULAR | Status: AC
  Administered 2022-06-09: 5 mg via INTRAVENOUS
  Filled 2022-06-09: qty 2

## 2022-06-09 MED ORDER — LORAZEPAM 2 MG/ML IJ SOLN
1.0000 mg | Freq: Once | INTRAMUSCULAR | Status: AC
  Administered 2022-06-09: 1 mg via INTRAVENOUS
  Filled 2022-06-09: qty 1

## 2022-06-09 MED ORDER — LORAZEPAM 1 MG PO TABS
2.0000 mg | ORAL_TABLET | Freq: Every day | ORAL | Status: DC
  Administered 2022-06-10 – 2022-06-13 (×4): 2 mg via ORAL
  Filled 2022-06-09 (×4): qty 2

## 2022-06-09 MED ORDER — KETOROLAC TROMETHAMINE 30 MG/ML IJ SOLN
30.0000 mg | Freq: Once | INTRAMUSCULAR | Status: AC
  Administered 2022-06-09: 30 mg via INTRAVENOUS
  Filled 2022-06-09: qty 1

## 2022-06-09 MED ORDER — LANTUS SOLOSTAR 100 UNIT/ML ~~LOC~~ SOPN: 68.0000 [IU] | PEN_INJECTOR | Freq: Every day | SUBCUTANEOUS | 3 refills | Status: DC

## 2022-06-09 MED ORDER — INSULIN GLARGINE-YFGN 100 UNIT/ML ~~LOC~~ SOLN
25.0000 [IU] | Freq: Every day | SUBCUTANEOUS | Status: DC
  Filled 2022-06-09: qty 0.25

## 2022-06-09 MED ORDER — LORAZEPAM 1 MG PO TABS
1.0000 mg | ORAL_TABLET | Freq: Every day | ORAL | Status: DC
  Administered 2022-06-10 – 2022-06-12 (×4): 1 mg via ORAL
  Filled 2022-06-09 (×4): qty 1

## 2022-06-09 MED ORDER — CARBAMAZEPINE ER 100 MG PO TB12
100.0000 mg | ORAL_TABLET | ORAL | Status: DC
  Administered 2022-06-10 – 2022-06-11 (×2): 100 mg via ORAL
  Filled 2022-06-09 (×2): qty 1

## 2022-06-09 MED ORDER — POTASSIUM CHLORIDE CRYS ER 20 MEQ PO TBCR
20.0000 meq | EXTENDED_RELEASE_TABLET | Freq: Every day | ORAL | Status: DC
  Administered 2022-06-09 – 2022-06-13 (×5): 20 meq via ORAL
  Filled 2022-06-09 (×5): qty 1

## 2022-06-09 MED ORDER — HYDROXYZINE HCL 25 MG PO TABS
25.0000 mg | ORAL_TABLET | Freq: Three times a day (TID) | ORAL | Status: DC | PRN
  Administered 2022-06-11 – 2022-06-13 (×3): 25 mg via ORAL
  Filled 2022-06-09 (×3): qty 1

## 2022-06-09 MED ORDER — INSULIN ASPART 100 UNIT/ML IJ SOLN
0.0000 [IU] | Freq: Three times a day (TID) | INTRAMUSCULAR | Status: DC
  Administered 2022-06-09 (×2): 8 [IU] via SUBCUTANEOUS
  Administered 2022-06-10: 15 [IU] via SUBCUTANEOUS
  Administered 2022-06-10: 11 [IU] via SUBCUTANEOUS
  Administered 2022-06-11 (×3): 5 [IU] via SUBCUTANEOUS
  Administered 2022-06-12: 11 [IU] via SUBCUTANEOUS
  Administered 2022-06-12: 5 [IU] via SUBCUTANEOUS
  Administered 2022-06-12: 11 [IU] via SUBCUTANEOUS
  Administered 2022-06-13: 8 [IU] via SUBCUTANEOUS

## 2022-06-09 MED ORDER — INSULIN GLARGINE-YFGN 100 UNIT/ML ~~LOC~~ SOLN
20.0000 [IU] | Freq: Every day | SUBCUTANEOUS | Status: DC
  Administered 2022-06-09: 20 [IU] via SUBCUTANEOUS
  Filled 2022-06-09: qty 0.2

## 2022-06-09 MED ORDER — HYDROMORPHONE HCL 2 MG PO TABS
2.0000 mg | ORAL_TABLET | Freq: Four times a day (QID) | ORAL | Status: DC | PRN
  Administered 2022-06-10 – 2022-06-13 (×10): 2 mg via ORAL
  Filled 2022-06-09 (×10): qty 1

## 2022-06-09 NOTE — Telephone Encounter (Signed)
Noted, patient will remain on Lantus 68 units daily

## 2022-06-09 NOTE — Inpatient Diabetes Management (Signed)
Inpatient Diabetes Program Recommendations  AACE/ADA: New Consensus Statement on Inpatient Glycemic Control (2015)  Target Ranges:  Prepandial:   less than 140 mg/dL      Peak postprandial:   less than 180 mg/dL (1-2 hours)      Critically ill patients:  140 - 180 mg/dL   Lab Results  Component Value Date   GLUCAP 270 (H) 06/09/2022   HGBA1C 6.8 (A) 05/28/2022    Review of Glycemic Control  Diabetes history: DM2 Outpatient Diabetes medications: Toujeo 68 units QD, Humalog max 50 units QD, per pt 4-8 units TID, metformin 500 BID, Mounjaro 5 mg weekly (not taking) Current orders for Inpatient glycemic control: Semglee 25 QD, NovolPog 0-15 TID  HgbA1C - 6.8% Endo - Shamleffer 2 gram sodium diet, eating 100% CBGs: 260, 270 mg/dL  Inpatient Diabetes Program Recommendations:    Consider adding Novolog 4 units TID for meal coverage   Add CHO-mod to 2 gram sodium diet.  Pt sleeping when stopped by to see patient this afternoon. Spoke with husband. States wife has sweet tooth and many dietary indiscretions. He requested this Coordinator to come back on 1/9 to talk with her about healthier diet. Denies any hypoglycemia and pt has Dexcom CGM. Will f/u in am.  Thank you. Lorenda Peck, RD, LDN, [place] Inpatient Diabetes Coordinator [phone]

## 2022-06-09 NOTE — Progress Notes (Signed)
PROGRESS NOTE    Amber Stephenson  JXB:[id number] DOB: [birth year] DOA: 06/08/2022 PCP: [name], MD    Chief Complaint  Patient presents with   Shortness of Breath    Brief Narrative:  Patient 77 year old female history of hypertension, type 2 diabetes, diabetic neuropathy, hyperlipidemia, chronic A-fib on anticoagulation with Eliquis, bipolar disorder, depression, nonobstructive CAD, chronic diastolic CHF, migraine headache, NASH, chronic back pain, restless leg syndrome presents with complaints of worsening shortness of breath x 2 days.  Patient had been noncompliant with her diuretics and has had some dietary indiscretion.  Patient admitted for acute CHF exacerbation, placed on IV Lasix.   Assessment & Plan:   Principal Problem:   Acute on chronic diastolic (congestive) heart failure (HCC) Active Problems:   Long term current use of anticoagulant   Chronic atrial fibrillation (HCC)   Restless legs   Essential hypertension   Depression, recurrent (HCC)   Diabetic neuropathy associated with type 2 diabetes mellitus (HCC)   Migraine headache with aura   Morbid obesity with alveolar hypoventilation (HCC)   Bipolar 1 disorder, manic, moderate (HCC)   Mixed hyperlipidemia   CAD (coronary artery disease)   Acute respiratory failure with hypoxemia (HCC)   #1 acute hypoxemic respiratory failure likely secondary to acute on chronic diastolic CHF -Secondary to medication noncompliance, dietary indiscretions. -Patient on admission with O2 requirements of 2 L nasal cannula. -Patient did state was taking the Lasix every other day to avoid frequent urination as she had stopped to do during the day.  Patient also endorses dietary indiscretions. -Improving clinically. -High-sensitivity troponin noted at 56 trending down to 30 points subsequently to 29.  Patient denies any crushing chest pain. -2D echo done with normal EF,NWMA, dilated left atrium. -Patient on Lasix 40 mg IV every 12  hours with urine output of 1.260 L over the past 24 hours. -Current weight of 194.45 pounds from 190 pounds on admission, doubt accuracy of weights. -Continue Lasix 40 mg IV every 12 hours, Vascepa, Lipitor, Toprol-XL.  2.  Headache/history of migraine headaches -Patient with complaints of headache. -Compazine 5 mg IV x 1 followed by Toradol 30 mg IV x 1. -Supportive care.  3.  Hypomagnesemia -Magnesium 1.2 on admission, repeat magnesium at 1.6 today. -Magnesium sulfate 4 g IV x 1. -Repeat labs in the AM.  4.  Bipolar disorder/depression/anxiety -Continue home regimen Abilify, Lexapro, Ativan. -Patient noted to be on Tegretol however per pharmacy due to drug drug interaction between Tegretol and Eliquis will taper Tegretol to every other day and patient will need to follow-up with outpatient psychiatrist for further titration and management.  5.  Permanent A-fib -Continue Toprol-XL for rate control. -Eliquis for anticoagulation.  6.  Hyperlipidemia -Continue Lipitor, Vascepa.  7.  Hypertension -Continue Toprol-XL, IV Lasix. -Will discontinue lisinopril to allow room for diuresis.  8.  Diabetes mellitus type 2/diabetic neuropathy -Hemoglobin A1c 6.8 (05/28/2022) -CBG 260 this morning. -Semglee 20 units daily. -SSI. -Continue Lyrica.  9.  Chronic leukocytosis -Afebrile. -Chest x-ray negative for any acute infiltrate. -Leukocytosis stable.  10.  Mild intermittent asthma -Stable. -Continue home regimen bronchodilators.  11.  Chronic back pain -Resume home regimen Dilaudid.  12.  History of OSA -Not on CPAP. -Outpatient follow-up.  70.  Obesity with BMI of 34 -Lifestyle education -Outpatient follow-up with PCP.  14.  CAD/elevated troponin -Likely secondary to early demand ischemia in the setting of volume overload. -Troponins trending down. -Patient denies any chest pain. -Continue metoprolol, statin, diuretics. -2D echo  with normal EF, no wall motion  abnormalities. -Outpatient follow-up with primary cardiologist, Dr. Radford Pax.  15.  Mild cognitive impairment -Being followed by neurology in the outpatient setting. -Patient has a scheduled neuropsychological evaluation 08/08/2022 for clarity of diagnosis and disease progression.      DVT prophylaxis: Eliquis Code Status: Full Family Communication: Updated patient, husband at bedside. Disposition: Home when clinically improved, adequately diuresed to euvolemic hopefully in the next 2 to 3 days.  Status is: Inpatient Remains inpatient appropriate because: Severity of illness   Consultants:  None  Procedures:  Chest x-ray 06/08/2022 2D echo 06/09/2022   Antimicrobials:  None   Subjective: Patient states shortness of breath improving from admission.  Does endorse noncompliance with Lasix as prescribed and the week prior to admission was taking it every other day as she stated she did not want diuretics to if your weight had day.  Complain of significant anxiety states has been taking 3 mg of Ativan in the mornings and 1 mg at bedtime.  Does endorse some dietary indiscretions.  Husband at bedside.  Patient with complaints of headache.  Objective: Vitals:   06/09/22 0500 06/09/22 0536 06/09/22 0900 06/09/22 1208  BP:  106/75  104/76  Pulse:  97  84  Resp:  16  18  Temp:  98.5 F (36.9 C)  97.9 F (36.6 C)  TempSrc:  Oral  Oral  SpO2:  98% 96% 97%  Weight: 88.2 kg     Height:        Intake/Output Summary (Last 24 hours) at 06/09/2022 1938 Last data filed at 06/09/2022 1801 Gross per 24 hour  Intake 940 ml  Output 1400 ml  Net -460 ml   Filed Weights   06/08/22 1617 06/09/22 0500  Weight: 86.2 kg 88.2 kg    Examination:  General exam: Appears anxious. Respiratory system: Diffuse crackles.  No wheezing.  Fair air movement.  Speaking in full sentences.  Cardiovascular system: S1 & S2 heard, RRR. + JVD.  No murmurs rubs or gallops.  Trace bilateral lower extremity edema.   Gastrointestinal system: Abdomen is nondistended, soft and nontender. No organomegaly or masses felt. Normal bowel sounds heard. Central nervous system: Alert and oriented. No focal neurological deficits. Extremities: Symmetric 5 x 5 power. Skin: No rashes, lesions or ulcers Psychiatry: Judgement and insight appear normal. Mood & affect appropriate.     Data Reviewed: I have personally reviewed following labs and imaging studies  CBC: Recent Labs  Lab 06/08/22 0759 06/08/22 0851 06/08/22 1642 06/09/22 0800  WBC 11.4*  --  13.5* 12.0*  NEUTROABS  --   --   --  7.7  HGB 14.0 13.6 13.8 14.0  HCT 41.7 40.0 41.3 43.9  MCV 90.8  --  91.8 95.6  PLT 191  --  211 923    Basic Metabolic Panel: Recent Labs  Lab 06/08/22 0759 06/08/22 0851 06/08/22 1642 06/09/22 0419  NA 138 138  --  137  K 4.2 4.1  --  3.9  CL 103  --   --  102  CO2 18*  --   --  27  GLUCOSE 295*  --   --  121*  BUN 14  --   --  19  CREATININE 0.59  --  0.63 0.75  CALCIUM 9.2  --   --  8.9  MG  --   --  1.2* 1.6*    GFR: Estimated Creatinine Clearance: 62.6 mL/min (by C-G formula based on SCr of  0.75 mg/dL).  Liver Function Tests: Recent Labs  Lab 06/08/22 0759  AST 19  ALT 8  ALKPHOS 94  BILITOT 0.5  PROT 7.0  ALBUMIN 4.0    CBG: Recent Labs  Lab 06/08/22 1133 06/09/22 1205 06/09/22 1638  GLUCAP 217* 260* 270*     Recent Results (from the past 240 hour(s))  Resp panel by RT-PCR (RSV, Flu A&B, Covid) Anterior Nasal Swab     Status: None   Collection Time: 06/08/22  7:59 AM   Specimen: Anterior Nasal Swab  Result Value Ref Range Status   SARS Coronavirus 2 by RT PCR NEGATIVE NEGATIVE Final    Comment: (NOTE) SARS-CoV-2 target nucleic acids are NOT DETECTED.  The SARS-CoV-2 RNA is generally detectable in upper respiratory specimens during the acute phase of infection. The lowest concentration of SARS-CoV-2 viral copies this assay can detect is 138 copies/mL. A negative result does  not preclude SARS-Cov-2 infection and should not be used as the sole basis for treatment or other patient management decisions. A negative result may occur with  improper specimen collection/handling, submission of specimen other than nasopharyngeal swab, presence of viral mutation(s) within the areas targeted by this assay, and inadequate number of viral copies(<138 copies/mL). A negative result must be combined with clinical observations, patient history, and epidemiological information. The expected result is Negative.  Fact Sheet for Patients:  EntrepreneurPulse.com.au  Fact Sheet for Healthcare Providers:  IncredibleEmployment.be  This test is no t yet approved or cleared by the [place] FDA and  has been authorized for detection and/or diagnosis of SARS-CoV-2 by FDA under an Emergency Use Authorization (EUA). This EUA will remain  in effect (meaning this test can be used) for the duration of the COVID-19 declaration under Section 564(b)(1) of the Act, 21 U.S.C.section 360bbb-3(b)(1), unless the authorization is terminated  or revoked sooner.       Influenza A by PCR NEGATIVE NEGATIVE Final   Influenza B by PCR NEGATIVE NEGATIVE Final    Comment: (NOTE) The Xpert Xpress SARS-CoV-2/FLU/RSV plus assay is intended as an aid in the diagnosis of influenza from Nasopharyngeal swab specimens and should not be used as a sole basis for treatment. Nasal washings and aspirates are unacceptable for Xpert Xpress SARS-CoV-2/FLU/RSV testing.  Fact Sheet for Patients: EntrepreneurPulse.com.au  Fact Sheet for Healthcare Providers: IncredibleEmployment.be  This test is not yet approved or cleared by the [place] FDA and has been authorized for detection and/or diagnosis of SARS-CoV-2 by FDA under an Emergency Use Authorization (EUA). This EUA will remain in effect (meaning this test can be used) for the  duration of the COVID-19 declaration under Section 564(b)(1) of the Act, 21 U.S.C. section 360bbb-3(b)(1), unless the authorization is terminated or revoked.     Resp Syncytial Virus by PCR NEGATIVE NEGATIVE Final    Comment: (NOTE) Fact Sheet for Patients: EntrepreneurPulse.com.au  Fact Sheet for Healthcare Providers: IncredibleEmployment.be  This test is not yet approved or cleared by the [place] FDA and has been authorized for detection and/or diagnosis of SARS-CoV-2 by FDA under an Emergency Use Authorization (EUA). This EUA will remain in effect (meaning this test can be used) for the duration of the COVID-19 declaration under Section 564(b)(1) of the Act, 21 U.S.C. section 360bbb-3(b)(1), unless the authorization is terminated or revoked.  Performed at [facility], [address], [place], [place] [address]          Radiology Studies: ECHOCARDIOGRAM COMPLETE  Result Date: 06/09/2022    ECHOCARDIOGRAM  REPORT   Patient Name:   EDGAR CORRIGAN Date of Exam: 06/09/2022 Medical Rec #:  [id number]         Height:       62.0 in Accession #:    [id number]        Weight:       194.4 lb Date of Birth:  Jul 26, 1945          BSA:          1.889 m Patient Age:    30 years          BP:           106/75 mmHg Patient Gender: F                 HR:           105 bpm. Exam Location:  Inpatient Procedure: 2D Echo, Cardiac Doppler and Color Doppler Indications:    CHF  History:        Patient has prior history of Echocardiogram examinations. CAD,                 Arrythmias:Atrial Fibrillation; Risk Factors:Hypertension,                 Dyslipidemia and Diabetes.  Sonographer:    Danne Baxter RDCS, FE, PE Referring Phys: [phone] [place]  Sonographer Comments: Technically difficult study due to poor echo windows and suboptimal parasternal window. IMPRESSIONS  1. Left ventricular ejection fraction, by estimation, is 65 to 70%. The  left ventricle has normal function. The left ventricle has no regional wall motion abnormalities. There is mild left ventricular hypertrophy. Left ventricular diastolic parameters are indeterminate.  2. Right ventricular systolic function is normal. The right ventricular size is normal.  3. Left atrial size was severely dilated.  4. The mitral valve is normal in structure. No evidence of mitral valve regurgitation. No evidence of mitral stenosis.  5. The aortic valve is normal in structure. Aortic valve regurgitation is not visualized. No aortic stenosis is present.  6. The inferior vena cava is normal in size with greater than 50% respiratory variability, suggesting right atrial pressure of 3 mmHg. FINDINGS  Left Ventricle: Left ventricular ejection fraction, by estimation, is 65 to 70%. The left ventricle has normal function. The left ventricle has no regional wall motion abnormalities. The left ventricular internal cavity size was normal in size. There is  mild left ventricular hypertrophy. Left ventricular diastolic parameters are indeterminate. Right Ventricle: The right ventricular size is normal. No increase in right ventricular wall thickness. Right ventricular systolic function is normal. Left Atrium: Left atrial size was severely dilated. Right Atrium: Right atrial size was normal in size. Pericardium: There is no evidence of pericardial effusion. Mitral Valve: The mitral valve is normal in structure. No evidence of mitral valve regurgitation. No evidence of mitral valve stenosis. MV peak gradient, 6.6 mmHg. The mean mitral valve gradient is 3.0 mmHg. Tricuspid Valve: The tricuspid valve is normal in structure. Tricuspid valve regurgitation is not demonstrated. No evidence of tricuspid stenosis. Aortic Valve: The aortic valve is normal in structure. Aortic valve regurgitation is not visualized. No aortic stenosis is present. Pulmonic Valve: The pulmonic valve was normal in structure. Pulmonic valve  regurgitation is not visualized. No evidence of pulmonic stenosis. Aorta: The aortic root is normal in size and structure. Venous: The inferior vena cava is normal in size with greater than 50% respiratory variability, suggesting right atrial pressure of 3 mmHg.  IAS/Shunts: No atrial level shunt detected by color flow Doppler.  LEFT VENTRICLE PLAX 2D LVIDd:         4.00 cm LVIDs:         2.80 cm LV PW:         1.20 cm LV IVS:        1.30 cm  RIGHT VENTRICLE RV S prime:     8.35 cm/s LEFT ATRIUM            Index LA diam:      4.90 cm  2.59 cm/m LA Vol (A4C): 190.0 ml 100.59 ml/m  AORTIC VALVE LVOT Vmax:   84.40 cm/s LVOT Vmean:  45.800 cm/s LVOT VTI:    0.109 m  AORTA Ao Asc diam: 3.40 cm MITRAL VALVE MV Area (PHT): 4.49 cm     SHUNTS MV Peak grad:  6.6 mmHg     Systemic VTI: 0.11 m MV Mean grad:  3.0 mmHg MV Vmax:       1.28 m/s MV Vmean:      77.5 cm/s MV Decel Time: 169 msec MV E velocity: 113.00 cm/s Candee Furbish MD Electronically signed by Candee Furbish MD Signature Date/Time: 06/09/2022/11:33:43 AM    Final    DG Chest Port 1 View  Result Date: 06/08/2022 CLINICAL DATA:  Shortness of breath EXAM: PORTABLE CHEST 1 VIEW COMPARISON:  November 11, 2021 FINDINGS: Stable cardiomegaly. The hila and mediastinum are stable. No pneumothorax. No suspicious nodules, masses, or focal infiltrates. Mild atelectasis in the right base. IMPRESSION: Mild atelectasis in the right base. Electronically Signed   By: Dorise Bullion III M.D.   On: 06/08/2022 08:26        Scheduled Meds:  apixaban  5 mg Oral BID   ARIPiprazole  2 mg Oral QHS   atorvastatin  40 mg Oral Daily   carbamazepine  100 mg Oral QODAY   escitalopram  10 mg Oral Daily   furosemide  40 mg Intravenous Q12H   icosapent Ethyl  2 g Oral BID   insulin aspart  0-15 Units Subcutaneous TID WC   insulin glargine-yfgn  20 Units Subcutaneous Daily   ketorolac  30 mg Intravenous Once   LORazepam  1 mg Oral Once   LORazepam  1 mg Oral QHS   [START ON 06/10/2022]  LORazepam  2 mg Oral Daily   metoprolol succinate  125 mg Oral BID   mometasone-formoterol  2 puff Inhalation BID   potassium chloride SA  20 mEq Oral Daily   pregabalin  200 mg Oral BID   prochlorperazine  5 mg Intravenous Once   senna-docusate  1 tablet Oral BID   sodium chloride flush  3 mL Intravenous Q12H   Continuous Infusions:  sodium chloride       LOS: 1 day    Time spent: 40 minutes    Irine Seal, MD [facility]   To contact the attending provider between 7A-7P or the covering provider during after hours 7P-7A, please log into the web site [email or web address] and access using universal [place] password for that web site. If you do not have the password, please call the hospital operator.  06/09/2022, 7:38 PM

## 2022-06-10 DIAGNOSIS — F339 Major depressive disorder, recurrent, unspecified: Secondary | ICD-10-CM | POA: Diagnosis not present

## 2022-06-10 DIAGNOSIS — I5033 Acute on chronic diastolic (congestive) heart failure: Secondary | ICD-10-CM | POA: Diagnosis not present

## 2022-06-10 DIAGNOSIS — F3112 Bipolar disorder, current episode manic without psychotic features, moderate: Secondary | ICD-10-CM | POA: Diagnosis not present

## 2022-06-10 DIAGNOSIS — I482 Chronic atrial fibrillation, unspecified: Secondary | ICD-10-CM | POA: Diagnosis not present

## 2022-06-10 LAB — URINE CULTURE: Culture: NO GROWTH

## 2022-06-10 LAB — CBC WITH DIFFERENTIAL/PLATELET
Abs Immature Granulocytes: 0.07 10*3/uL (ref 0.00–0.07)
Basophils Absolute: 0 10*3/uL (ref 0.0–0.1)
Basophils Relative: 0 %
Eosinophils Absolute: 0.2 10*3/uL (ref 0.0–0.5)
Eosinophils Relative: 2 %
HCT: 41.5 % (ref 36.0–46.0)
Hemoglobin: 13.1 g/dL (ref 12.0–15.0)
Immature Granulocytes: 1 %
Lymphocytes Relative: 25 %
Lymphs Abs: 2.6 10*3/uL (ref 0.7–4.0)
MCH: 30.3 pg (ref 26.0–34.0)
MCHC: 31.6 g/dL (ref 30.0–36.0)
MCV: 95.8 fL (ref 80.0–100.0)
Monocytes Absolute: 0.7 10*3/uL (ref 0.1–1.0)
Monocytes Relative: 7 %
Neutro Abs: 6.6 10*3/uL (ref 1.7–7.7)
Neutrophils Relative %: 65 %
Platelets: 193 10*3/uL (ref 150–400)
RBC: 4.33 MIL/uL (ref 3.87–5.11)
RDW: 13.2 % (ref 11.5–15.5)
WBC: 10.2 10*3/uL (ref 4.0–10.5)
nRBC: 0 % (ref 0.0–0.2)

## 2022-06-10 LAB — GLUCOSE, CAPILLARY
Glucose-Capillary: 151 mg/dL — ABNORMAL HIGH (ref 70–99)
Glucose-Capillary: 330 mg/dL — ABNORMAL HIGH (ref 70–99)
Glucose-Capillary: 434 mg/dL — ABNORMAL HIGH (ref 70–99)
Glucose-Capillary: 403 mg/dL — ABNORMAL HIGH (ref 70–99)
Glucose-Capillary: 298 mg/dL — ABNORMAL HIGH (ref 70–99)

## 2022-06-10 LAB — BASIC METABOLIC PANEL
Anion gap: 7 (ref 5–15)
BUN: 32 mg/dL — ABNORMAL HIGH (ref 8–23)
CO2: 26 mmol/L (ref 22–32)
Calcium: 8.5 mg/dL — ABNORMAL LOW (ref 8.9–10.3)
Chloride: 102 mmol/L (ref 98–111)
Creatinine, Ser: 1.14 mg/dL — ABNORMAL HIGH (ref 0.44–1.00)
GFR, Estimated: 50 mL/min — ABNORMAL LOW (ref >60)
Glucose, Bld: 171 mg/dL — ABNORMAL HIGH (ref 70–99)
Potassium: 4.4 mmol/L (ref 3.5–5.1)
Sodium: 135 mmol/L (ref 135–145)

## 2022-06-10 LAB — MAGNESIUM: Magnesium: 2.4 mg/dL (ref 1.7–2.4)

## 2022-06-10 MED ORDER — KETOROLAC TROMETHAMINE 30 MG/ML IJ SOLN: 30.0000 mg | Freq: Four times a day (QID) | INTRAMUSCULAR | Status: DC | PRN

## 2022-06-10 MED ORDER — SODIUM CHLORIDE 0.9 % IV SOLN
2.0000 g | INTRAVENOUS | Status: AC
  Administered 2022-06-10 – 2022-06-12 (×3): 2 g via INTRAVENOUS
  Filled 2022-06-10 (×3): qty 20

## 2022-06-10 MED ORDER — INSULIN ASPART 100 UNIT/ML IJ SOLN
8.0000 [IU] | Freq: Three times a day (TID) | INTRAMUSCULAR | Status: DC
  Administered 2022-06-11 – 2022-06-12 (×6): 8 [IU] via SUBCUTANEOUS

## 2022-06-10 MED ORDER — INSULIN GLARGINE-YFGN 100 UNIT/ML ~~LOC~~ SOLN
40.0000 [IU] | Freq: Every day | SUBCUTANEOUS | Status: DC
  Administered 2022-06-11 – 2022-06-12 (×2): 40 [IU] via SUBCUTANEOUS
  Filled 2022-06-10 (×3): qty 0.4

## 2022-06-10 MED ORDER — INSULIN ASPART 100 UNIT/ML IJ SOLN
20.0000 [IU] | Freq: Once | INTRAMUSCULAR | Status: AC
  Administered 2022-06-10: 20 [IU] via SUBCUTANEOUS

## 2022-06-10 MED ORDER — KETOROLAC TROMETHAMINE 30 MG/ML IJ SOLN: 15.0000 mg | Freq: Four times a day (QID) | INTRAMUSCULAR | Status: DC | PRN

## 2022-06-10 MED ORDER — INSULIN GLARGINE-YFGN 100 UNIT/ML ~~LOC~~ SOLN
30.0000 [IU] | Freq: Every day | SUBCUTANEOUS | Status: DC
  Administered 2022-06-10: 30 [IU] via SUBCUTANEOUS
  Filled 2022-06-10 (×2): qty 0.3

## 2022-06-10 MED ORDER — CHLORHEXIDINE GLUCONATE CLOTH 2 % EX PADS
6.0000 | MEDICATED_PAD | Freq: Every day | CUTANEOUS | Status: DC
  Administered 2022-06-10 – 2022-06-11 (×2): 6 via TOPICAL

## 2022-06-10 MED ORDER — INSULIN GLARGINE-YFGN 100 UNIT/ML ~~LOC~~ SOLN
10.0000 [IU] | Freq: Once | SUBCUTANEOUS | Status: AC
  Administered 2022-06-10: 10 [IU] via SUBCUTANEOUS
  Filled 2022-06-10: qty 0.1

## 2022-06-10 MED ORDER — INSULIN ASPART 100 UNIT/ML IJ SOLN
4.0000 [IU] | Freq: Three times a day (TID) | INTRAMUSCULAR | Status: DC
  Administered 2022-06-10: 4 [IU] via SUBCUTANEOUS

## 2022-06-10 NOTE — TOC Initial Note (Signed)
Transition of Care [facility]) - Initial/Assessment Note    Patient Details  Name: Amber Stephenson MRN: [id number] Date of Birth: 1946-03-21  Transition of [facility]) CM/SW Contact:    Leeroy Cha, RN Phone Number: 06/10/2022, 9:00 AM  Clinical Narrative:                  Transition of Care [facility]) Screening Note   Patient Details  Name: Amber Stephenson Date of Birth: [birth year]   Transition of Care [facility]) CM/SW Contact:    Leeroy Cha, RN Phone Number: 06/10/2022, 9:01 AM    Transition of Care Department [facility]) has reviewed patient and no TOC needs have been identified at this time. We will continue to monitor patient advancement through interdisciplinary progression rounds. If new patient transition needs arise, please place a TOC consult.    Expected Discharge Plan: Home/Self Care Barriers to Discharge: Continued Medical Work up   Patient Goals and CMS Choice Patient states their goals for this hospitalization and ongoing recovery are:: to go home   Choice offered to / list presented to : Patient      Expected Discharge Plan and Services   Discharge Planning Services: CM Consult   Living arrangements for the past 2 months: Single Family Home                                      Prior Living Arrangements/Services Living arrangements for the past 2 months: Single Family Home Lives with:: Spouse Patient language and need for interpreter reviewed:: Yes Do you feel safe going back to the place where you live?: Yes            Criminal Activity/Legal Involvement Pertinent to Current Situation/Hospitalization: No - Comment as needed  Activities of Daily Living Home Assistive Devices/Equipment: None ADL Screening (condition at time of admission) Patient's cognitive ability adequate to safely complete daily activities?: Yes Is the patient deaf or have difficulty hearing?: No Does the patient have difficulty seeing, even when wearing  glasses/contacts?: No Does the patient have difficulty concentrating, remembering, or making decisions?: No Patient able to express need for assistance with ADLs?: No Does the patient have difficulty dressing or bathing?: No Independently performs ADLs?: Yes (appropriate for developmental age) Does the patient have difficulty walking or climbing stairs?: No Weakness of Legs: None Weakness of Arms/Hands: None  Permission Sought/Granted                  Emotional Assessment Appearance:: Appears stated age Attitude/Demeanor/Rapport: Engaged Affect (typically observed): Appropriate Orientation: : Oriented to Self, Oriented to Place, Oriented to  Time, Oriented to Situation Alcohol / Substance Use: Never Used Psych Involvement: No (comment)  Admission diagnosis:  Hyperglycemia [R73.9] Hypoxia [R09.02] Noncompliance with medications [Z91.148] Acute dyspnea [R06.00] Acute on chronic diastolic (congestive) heart failure (HCC) [I50.33] CHF (congestive heart failure) ([place]) [I50.9] Patient Active Problem List   Diagnosis Date Noted   Acute respiratory failure with hypoxemia ([place]) 06/09/2022   Acute on chronic diastolic (congestive) heart failure ([place]) 06/08/2022   CHF (congestive heart failure) ([place]) 06/08/2022   Positive fecal occult blood test 03/11/2022   Rectal bleeding 03/11/2022   Hemorrhoids 03/11/2022   Rectal itching 03/11/2022   Encounter for screening fecal occult blood testing 03/11/2022   Microalbuminuria 01/21/2022   Tremor, essential 12/11/2021   Superficial fungus infection of skin 09/03/2021   Chronic right-sided low back pain  with right-sided sciatica 07/19/2021   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   Vaginal itching 04/16/2021   Dysuria 04/16/2021   Sensory ataxia 03/28/2021   Yeast infection 01/14/2021   UTI (urinary tract infection) 12/14/2020   Elevated brain natriuretic peptide (BNP) level 12/14/2020   Elevated troponin 12/14/2020   Chronic  post-traumatic stress disorder 12/06/2020   Tremor 12/06/2020   Senile nuclear sclerosis 12/06/2020   Senile corneal changes 12/06/2020   Psychoses ([place]) 12/06/2020   Presbyopia 12/06/2020   Postmenopausal bleeding 12/06/2020   Postcoital bleeding 12/06/2020   Pinguecula 12/06/2020   Pain in joint, shoulder region 12/06/2020   Other psychological or physical stress 12/06/2020   Other acquired hammer toe 12/06/2020   Osteopenia 12/06/2020   Opioid dependence ([place]) 12/06/2020   Non-neoplastic nevus 12/06/2020   Essential hypertriglyceridemia 12/06/2020   Hypermetropia 12/06/2020   Debility 12/06/2020   Cystitis 12/06/2020   Depressed bipolar I disorder in full remission ([place]) 12/06/2020   Benzodiazepine dependence ([place]) 12/06/2020   Benign neoplasm of skin 12/06/2020   Anxiety 12/06/2020   Functional diarrhea 10/26/2020   Diabetes mellitus due to underlying condition with hyperglycemia, with long-term current use of insulin ([place]) 09/24/2020   Dyslipidemia 09/24/2020   Head trauma 09/17/2020   Chronic respiratory failure with hypoxia ([place]) 04/25/2020   Chronic constipation 04/25/2020   Back pain/sacroiliitis--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   Ambulatory dysfunction--back pain and falls 04/23/2020   Chronic pain syndrome 08/22/2019   Nerve pain 08/22/2019   Myofascial pain dysfunction syndrome 08/22/2019   CAD (coronary artery disease)    Hypocalcemia    S/P shoulder replacement, 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   History of adenomatous polyp of colon 05/21/2019   Polypharmacy 05/21/2019   Benign paroxysmal positional vertigo due to bilateral vestibular disorder 05/20/2019   Mixed hyperlipidemia 05/20/2019   Vertigo 04/25/2019   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   OSA (obstructive sleep apnea) 02/24/2019   Allergic rhinitis 02/24/2019   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   Morbid obesity with alveolar  hypoventilation ([place]) 07/06/2018   Obesity (BMI 30-39.9) 07/06/2018   At risk for adverse drug event 07/06/2018   Cardiomegaly 24/23/5361   Non-alcoholic fatty liver disease 01/12/2018   Mild intermittent asthma 01/12/2018   Senile osteoporosis 12/15/2017   Fibromyalgia 03/19/2017   Migraine headache with aura 02/12/2016   Diabetic neuropathy associated with type 2 diabetes mellitus ([place]) 02/06/2016   Bipolar 1 disorder, manic, moderate ([place]) 10/04/2015   Depression, recurrent ([place]) 10/03/2015   Herpes genitalis in women 07/16/2015   Long term current use of anticoagulant 05/30/2015   Family history of coronary arteriosclerosis 05/30/2015   Chronic diastolic heart failure ([place]) 05/30/2015   Dyspnea on exertion    Essential hypertension    Restless legs 04/27/2015   Lipoma 02/08/2015   Insomnia 01/23/2015   Chronic back pain 01/04/2015   Chronic atrial fibrillation ([place]) 01/04/2015   PCP:  [name], MD Pharmacy:   [facility] [place], [place] - [place] AT [place] [address] [place] [address] Phone: [phone] Fax: [phone]  [facility], [place] [address] [place] [address] Phone: [phone] Fax: [place] [address] Phone: [phone] Fax: [phone]     Social Determinants of Health (SDOH) Social History: SDOH Screenings  Food Insecurity: No Food Insecurity (06/08/2022)  Housing: Low Risk  (06/08/2022)  Transportation Needs: No Transportation Needs (06/08/2022)  Utilities: Not At Risk (06/08/2022)  Alcohol Screen: Low Risk  (12/13/2021)  Depression (PHQ2-9): High Risk (04/17/2022)  Financial Resource Strain: Low Risk  (12/13/2021)  Physical Activity: Inactive (12/13/2021)  Social Connections: Socially Integrated (12/13/2021)  Stress: No Stress  Concern Present (12/13/2021)  Tobacco Use: Low Risk  (06/08/2022)   SDOH Interventions:     Readmission Risk Interventions   No data to display

## 2022-06-10 NOTE — Evaluation (Signed)
Physical Therapy Evaluation Patient Details Name: Amber Stephenson MRN: [id number] DOB: [birth year] Today's Date: 06/10/2022  History of Present Illness  77 yo female admitted with acute on chronic heart failure, acute resp failure, headache. Hx of chronic back pain, obesity, medical noncompliance, bipolar d/o, neuropathy, pulm HTN, DM, RLS, Afib, CAD  Clinical Impression  On eval, pt required Min A for mobility. Upon my arrival, she reported fatigue and dyspnea (just recently returned to bed after walking to/from bathroom). O2 93% on RA at rest. Pt agreeable to walk a short distance in room to allow PT assessment. She is unsteady and at risk for falls, especially when walking without UE support. O2 89% on RA with ambulation, dyspnea 2/4. She could benefit from RW use for ambulation safety however both the patient and husband reports pt prefers to "furniture walk/cruise" throughout home.They state that there is not enough room for walker use. Pt is agreeable to HHPT f/u.Will continue to follow and progress activity as tolerated. Hopefully mobility team can assist with mobilizing patient as well.        Recommendations for follow up therapy are one component of a multi-disciplinary discharge planning process, led by the attending physician.  Recommendations may be updated based on patient status, additional functional criteria and insurance authorization.  Follow Up Recommendations Home health PT      Assistance Recommended at Discharge PRN  Patient can return home with the following  A little help with walking and/or transfers;A little help with bathing/dressing/bathroom;Assistance with cooking/housework;Direct supervision/assist for financial management;Assist for transportation;Help with stairs or ramp for entrance    Equipment Recommendations None recommended by PT  Recommendations for Other Services       Functional Status Assessment Patient has had a recent decline in their functional  status and demonstrates the ability to make significant improvements in function in a reasonable and predictable amount of time.     Precautions / Restrictions Precautions Precautions: Fall Precaution Comments: monitor O2 Restrictions Weight Bearing Restrictions: No      Mobility  Bed Mobility Overal bed mobility: Needs Assistance Bed Mobility: Supine to Sit, Sit to Supine     Supine to sit: Min assist Sit to supine: Supervision   General bed mobility comments: Small amount of assist to get to EOB; none for back to bed.    Transfers Overall transfer level: Needs assistance Equipment used: None Transfers: Sit to/from Stand Sit to Stand: Min guard           General transfer comment: Min guard for safety. Unsteady.    Ambulation/Gait Ambulation/Gait assistance: Min assist Gait Distance (Feet): 20 Feet Assistive device: None Gait Pattern/deviations: Step-through pattern, Decreased stride length       General Gait Details: Intermittent assist to steady due to impaired balance. Fall risk. Pt and husband both report pt "furniture walks/cruises" inside home. O2 89% on RA, dyspnea 2/4  Stairs            Wheelchair Mobility    Modified Rankin (Stroke Patients Only)       Balance Overall balance assessment: Needs assistance         Standing balance support: During functional activity Standing balance-Leahy Scale: Fair                               Pertinent Vitals/Pain Pain Assessment Pain Assessment: 0-10 Pain Score: 7  Pain Location: back pain with activity Pain Descriptors / Indicators: Grimacing, Discomfort,  Sore Pain Intervention(s): Limited activity within patient's tolerance, Monitored during session, Repositioned    Home Living Family/patient expects to be discharged to:: Private residence Living Arrangements: Spouse/significant other Available Help at Discharge: Family;Available 24 hours/day Type of Home: House Home Access:  Stairs to enter Entrance Stairs-Rails: Left Entrance Stairs-Number of Steps: 5   Home Layout: One level Home Equipment: Conservation officer, nature (2 wheels);Cane - single point;Grab bars - tub/shower;Grab bars - toilet      Prior Function               Mobility Comments: walking without a device ADLs Comments: assist with dressing     Hand Dominance        Extremity/Trunk Assessment   Upper Extremity Assessment Upper Extremity Assessment: Overall WFL for tasks assessed    Lower Extremity Assessment Lower Extremity Assessment: Generalized weakness    Cervical / Trunk Assessment Cervical / Trunk Assessment: Normal  Communication   Communication: No difficulties  Cognition Arousal/Alertness: Awake/alert Behavior During Therapy: WFL for tasks assessed/performed Overall Cognitive Status: Within Functional Limits for tasks assessed                                          General Comments      Exercises     Assessment/Plan    PT Assessment Patient needs continued PT services  PT Problem List Decreased strength;Decreased activity tolerance;Decreased balance;Decreased mobility;Decreased safety awareness;Decreased knowledge of use of DME;Pain       PT Treatment Interventions DME instruction;Gait training;Therapeutic exercise;Balance training;Functional mobility training;Therapeutic activities;Patient/family education    PT Goals (Current goals can be found in the Care Plan section)  Acute Rehab PT Goals Patient Stated Goal: home soon. to get better. PT Goal Formulation: With patient/family Time For Goal Achievement: 06/24/22 Potential to Achieve Goals: Good    Frequency Min 3X/week     Co-evaluation               AM-PAC PT "6 Clicks" Mobility  Outcome Measure Help needed turning from your back to your side while in a flat bed without using bedrails?: A Little Help needed moving from lying on your back to sitting on the side of a flat bed  without using bedrails?: A Little Help needed moving to and from a bed to a chair (including a wheelchair)?: A Little Help needed standing up from a chair using your arms (e.g., wheelchair or bedside chair)?: A Little Help needed to walk in hospital room?: A Little Help needed climbing 3-5 steps with a railing? : A Little 6 Click Score: 18    End of Session   Activity Tolerance: Patient tolerated treatment well;Patient limited by fatigue Patient left: in bed;with call bell/phone within reach;with family/visitor present;with bed alarm set   PT Visit Diagnosis: Muscle weakness (generalized) (M62.81);Difficulty in walking, not elsewhere classified (R26.2)    Time: 9983-3825 PT Time Calculation (min) (ACUTE ONLY): 15 min   Charges:   PT Evaluation $PT Eval Low Complexity: [place], PT Acute Rehabilitation  Office: [phone]
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
## 2022-06-10 NOTE — Progress Notes (Addendum)
PROGRESS NOTE    Amber Stephenson  ENI:[id number] DOB: 1946/05/20 DOA: 06/08/2022 PCP: [name], MD    Chief Complaint  Patient presents with   Shortness of Breath    Brief Narrative:  Patient 77 year old female history of hypertension, type 2 diabetes, diabetic neuropathy, hyperlipidemia, chronic A-fib on anticoagulation with Eliquis, bipolar disorder, depression, nonobstructive CAD, chronic diastolic CHF, migraine headache, NASH, chronic back pain, restless leg syndrome presents with complaints of worsening shortness of breath x 2 days.  Patient had been noncompliant with her diuretics and has had some dietary indiscretion.  Patient admitted for acute CHF exacerbation, placed on IV Lasix.   Assessment & Plan:   Principal Problem:   Acute on chronic diastolic (congestive) heart failure (HCC) Active Problems:   Long term current use of anticoagulant   Chronic atrial fibrillation (HCC)   Restless legs   Essential hypertension   Depression, recurrent (HCC)   Diabetic neuropathy associated with type 2 diabetes mellitus (HCC)   Migraine headache with aura   Morbid obesity with alveolar hypoventilation (HCC)   Bipolar 1 disorder, manic, moderate (HCC)   Mixed hyperlipidemia   CAD (coronary artery disease)   UTI (urinary tract infection)   Acute respiratory failure with hypoxemia (HCC)   #1 acute hypoxemic respiratory failure likely secondary to acute on chronic diastolic CHF -Secondary to medication noncompliance, dietary indiscretions. -Patient on admission with O2 requirements of 2 L nasal cannula. -Patient did state was taking the Lasix every other day to avoid frequent urination as she had stopped to do during the day.  Patient also endorses dietary indiscretions. -Improving clinically. -High-sensitivity troponin noted at 56 trending down to 30 points subsequently to 29.  Patient denies any crushing chest pain. -2D echo done with normal EF,NWMA, dilated left  atrium. -Patient on Lasix 40 mg IV every 12 hours with urine output of 1.350 L over the past 24 hours. -Current weight of 195.77 pounds from 194.45 pounds from 190 pounds on admission, doubt accuracy of weights. -Continue Lasix 40 mg IV every 12 hours, Vascepa, Lipitor, Toprol-XL. -With continued improvement hopefully could transition to oral Lasix in the next 1 to 2 days.  Monitor renal function and blood pressure.  2.  Headache/history of migraine headaches -Patient with complaints of headache. -Compazine 5 mg IV x 1 followed by Toradol 30 mg IV x 1 given with some improvement with headache. -Place on Toradol 15 mg IV every 6 hours as needed.. -Supportive care.  3.  Hypomagnesemia -Repleted.   -Magnesium at 2.4 from 1.6 from 1.2.   -Repeat labs in AM.  4.  Bipolar disorder/depression/anxiety -Continue home regimen Abilify, Lexapro, Ativan. -Patient noted to be on Tegretol however per pharmacy due to drug drug interaction between Tegretol and Eliquis will taper Tegretol to every other day and patient will need to follow-up with outpatient psychiatrist for further titration and management.  5.  Permanent A-fib -Continue Toprol-XL for rate control. -Eliquis for anticoagulation.  6.  Hyperlipidemia -Continue Lipitor, Vascepa.  7.  Hypertension -Continue Toprol-XL, IV Lasix. -Continue to hold lisinopril to allow room for diuresis.  8.  Diabetes mellitus type 2/diabetic neuropathy -Hemoglobin A1c 6.8 (05/28/2022) -CBG 151 this morning. -Increase Semglee 40 units daily. -Place on meal coverage NovoLog 8 units 3 times daily with meals. -SSI. -Continue Lyrica.  9.  Chronic leukocytosis -Afebrile. -Chest x-ray negative for any acute infiltrate. -Leukocytosis trending down.  10.  Mild intermittent asthma -Stable. -Continue home regimen bronchodilators.  11.  Chronic back pain -Continue  home regimen Dilaudid.    12.  History of OSA -Not on CPAP. -Outpatient  follow-up.  66.  Obesity with BMI of 34 -Lifestyle education -Outpatient follow-up with PCP.  14.  CAD/elevated troponin -Likely secondary to early demand ischemia in the setting of volume overload. -Troponins trending down. -Patient asymptomatic with no chest pain.   -Continue metoprolol, statin, diuretics. -2D echo with normal EF, no wall motion abnormalities. -Outpatient follow-up with primary cardiologist, Dr. Radford Pax.  15.  Mild cognitive impairment -Being followed by neurology in the outpatient setting. -Patient has a scheduled neuropsychological evaluation 08/08/2022 for clarity of diagnosis and disease progression.      DVT prophylaxis: Eliquis Code Status: Full Family Communication: Updated patient, husband at bedside. Disposition: Home when clinically improved, adequately diuresed to euvolemic hopefully in the next 2 to 3 days.  Status is: Inpatient Remains inpatient appropriate because: Severity of illness   Consultants:  None  Procedures:  Chest x-ray 06/08/2022 2D echo 06/09/2022   Antimicrobials:  None   Subjective: Still with shortness of breath, not at baseline but improving per patient.  Good urine output.  Denies any chest pain.  No abdominal pain.  Tolerating current diet.  Husband at bedside.    Objective: Vitals:   06/10/22 0457 06/10/22 0457 06/10/22 1104 06/10/22 1202  BP:  115/76  (!) 119/50  Pulse:  87  76  Resp:  20    Temp:  98.2 F (36.8 C)  97.6 F (36.4 C)  TempSrc:  Oral  Oral  SpO2:  99% 95% 94%  Weight: 88.8 kg     Height:        Intake/Output Summary (Last 24 hours) at 06/10/2022 1206 Last data filed at 06/10/2022 0946 Gross per 24 hour  Intake 773 ml  Output 1350 ml  Net -577 ml    Filed Weights   06/08/22 1617 06/09/22 0500 06/10/22 0457  Weight: 86.2 kg 88.2 kg 88.8 kg    Examination:  General exam: Less anxious. Respiratory system: Bibasilar crackles.  No wheezing.  Fair air movement.  Speaking in full sentences.    Cardiovascular system: Regular rate rhythm no murmurs rubs or gallops.  Trace bilateral lower extremity edema. Gastrointestinal system: Abdomen is soft, nondistended, nontender, positive bowel sounds.  No rebound.  No guarding.  Central nervous system: Alert and oriented. No focal neurological deficits. Extremities: Symmetric 5 x 5 power. Skin: No rashes, lesions or ulcers Psychiatry: Judgement and insight appear normal. Mood & affect appropriate.     Data Reviewed: I have personally reviewed following labs and imaging studies  CBC: Recent Labs  Lab 06/08/22 0759 06/08/22 0851 06/08/22 1642 06/09/22 0800 06/10/22 0415  WBC 11.4*  --  13.5* 12.0* 10.2  NEUTROABS  --   --   --  7.7 6.6  HGB 14.0 13.6 13.8 14.0 13.1  HCT 41.7 40.0 41.3 43.9 41.5  MCV 90.8  --  91.8 95.6 95.8  PLT 191  --  211 221 193     Basic Metabolic Panel: Recent Labs  Lab 06/08/22 0759 06/08/22 0851 06/08/22 1642 06/09/22 0419 06/10/22 0415  NA 138 138  --  137 135  K 4.2 4.1  --  3.9 4.4  CL 103  --   --  102 102  CO2 18*  --   --  27 26  GLUCOSE 295*  --   --  121* 171*  BUN 14  --   --  19 32*  CREATININE 0.59  --  0.63 0.75 1.14*  CALCIUM 9.2  --   --  8.9 8.5*  MG  --   --  1.2* 1.6* 2.4     GFR: Estimated Creatinine Clearance: 44.2 mL/min (A) (by C-G formula based on SCr of 1.14 mg/dL (H)).  Liver Function Tests: Recent Labs  Lab 06/08/22 0759  AST 19  ALT 8  ALKPHOS 94  BILITOT 0.5  PROT 7.0  ALBUMIN 4.0     CBG: Recent Labs  Lab 06/09/22 1638 06/09/22 2151 06/10/22 0452 06/10/22 0750 06/10/22 1157  GLUCAP 270* 242* 151* 330* 434*      Recent Results (from the past 240 hour(s))  Resp panel by RT-PCR (RSV, Flu A&B, Covid) Anterior Nasal Swab     Status: None   Collection Time: 06/08/22  7:59 AM   Specimen: Anterior Nasal Swab  Result Value Ref Range Status   SARS Coronavirus 2 by RT PCR NEGATIVE NEGATIVE Final    Comment: (NOTE) SARS-CoV-2 target nucleic  acids are NOT DETECTED.  The SARS-CoV-2 RNA is generally detectable in upper respiratory specimens during the acute phase of infection. The lowest concentration of SARS-CoV-2 viral copies this assay can detect is 138 copies/mL. A negative result does not preclude SARS-Cov-2 infection and should not be used as the sole basis for treatment or other patient management decisions. A negative result may occur with  improper specimen collection/handling, submission of specimen other than nasopharyngeal swab, presence of viral mutation(s) within the areas targeted by this assay, and inadequate number of viral copies(<138 copies/mL). A negative result must be combined with clinical observations, patient history, and epidemiological information. The expected result is Negative.  Fact Sheet for Patients:  EntrepreneurPulse.com.au  Fact Sheet for Healthcare Providers:  IncredibleEmployment.be  This test is no t yet approved or cleared by the [place] FDA and  has been authorized for detection and/or diagnosis of SARS-CoV-2 by FDA under an Emergency Use Authorization (EUA). This EUA will remain  in effect (meaning this test can be used) for the duration of the COVID-19 declaration under Section 564(b)(1) of the Act, 21 U.S.C.section 360bbb-3(b)(1), unless the authorization is terminated  or revoked sooner.       Influenza A by PCR NEGATIVE NEGATIVE Final   Influenza B by PCR NEGATIVE NEGATIVE Final    Comment: (NOTE) The Xpert Xpress SARS-CoV-2/FLU/RSV plus assay is intended as an aid in the diagnosis of influenza from Nasopharyngeal swab specimens and should not be used as a sole basis for treatment. Nasal washings and aspirates are unacceptable for Xpert Xpress SARS-CoV-2/FLU/RSV testing.  Fact Sheet for Patients: EntrepreneurPulse.com.au  Fact Sheet for Healthcare Providers: IncredibleEmployment.be  This  test is not yet approved or cleared by the [place] FDA and has been authorized for detection and/or diagnosis of SARS-CoV-2 by FDA under an Emergency Use Authorization (EUA). This EUA will remain in effect (meaning this test can be used) for the duration of the COVID-19 declaration under Section 564(b)(1) of the Act, 21 U.S.C. section 360bbb-3(b)(1), unless the authorization is terminated or revoked.     Resp Syncytial Virus by PCR NEGATIVE NEGATIVE Final    Comment: (NOTE) Fact Sheet for Patients: EntrepreneurPulse.com.au  Fact Sheet for Healthcare Providers: IncredibleEmployment.be  This test is not yet approved or cleared by the [place] FDA and has been authorized for detection and/or diagnosis of SARS-CoV-2 by FDA under an Emergency Use Authorization (EUA). This EUA will remain in effect (meaning this test can be used) for the duration of the COVID-19  declaration under Section 564(b)(1) of the Act, 21 U.S.C. section 360bbb-3(b)(1), unless the authorization is terminated or revoked.  Performed at [facility], [address] [place], [place] [address]   Urine Culture     Status: None   Collection Time: 06/09/22  9:11 AM   Specimen: Urine, Catheterized  Result Value Ref Range Status   Specimen Description   Final    URINE, CATHETERIZED Performed at [facility] [address], [place], [place] [address]    Special Requests   Final    NONE Performed at [facility], [place] [address] [place], [place] [address]    Culture   Final    NO GROWTH Performed at [facility], [place] [address] [place], [place] [address]    Report Status 06/10/2022 FINAL  Final         Radiology Studies: ECHOCARDIOGRAM COMPLETE  Result Date: 06/09/2022    ECHOCARDIOGRAM REPORT   Patient Name:   Amber Stephenson Blow Date of Exam: 06/09/2022 Medical Rec #:  [id number]         Height:        62.0 in Accession #:    [id number]        Weight:       194.4 lb Date of Birth:  17-Apr-1946          BSA:          1.889 m Patient Age:    53 years          BP:           106/75 mmHg Patient Gender: F                 HR:           105 bpm. Exam Location:  Inpatient Procedure: 2D Echo, Cardiac Doppler and Color Doppler Indications:    CHF  History:        Patient has prior history of Echocardiogram examinations. CAD,                 Arrythmias:Atrial Fibrillation; Risk Factors:Hypertension,                 Dyslipidemia and Diabetes.  Sonographer:    Danne Baxter RDCS, FE, PE Referring Phys: [phone] [place]  Sonographer Comments: Technically difficult study due to poor echo windows and suboptimal parasternal window. IMPRESSIONS  1. Left ventricular ejection fraction, by estimation, is 65 to 70%. The left ventricle has normal function. The left ventricle has no regional wall motion abnormalities. There is mild left ventricular hypertrophy. Left ventricular diastolic parameters are indeterminate.  2. Right ventricular systolic function is normal. The right ventricular size is normal.  3. Left atrial size was severely dilated.  4. The mitral valve is normal in structure. No evidence of mitral valve regurgitation. No evidence of mitral stenosis.  5. The aortic valve is normal in structure. Aortic valve regurgitation is not visualized. No aortic stenosis is present.  6. The inferior vena cava is normal in size with greater than 50% respiratory variability, suggesting right atrial pressure of 3 mmHg. FINDINGS  Left Ventricle: Left ventricular ejection fraction, by estimation, is 65 to 70%. The left ventricle has normal function. The left ventricle has no regional wall motion abnormalities. The left ventricular internal cavity size was normal in size. There is  mild left ventricular hypertrophy. Left ventricular diastolic parameters are indeterminate. Right Ventricle: The right ventricular size is normal. No  increase  in right ventricular wall thickness. Right ventricular systolic function is normal. Left Atrium: Left atrial size was severely dilated. Right Atrium: Right atrial size was normal in size. Pericardium: There is no evidence of pericardial effusion. Mitral Valve: The mitral valve is normal in structure. No evidence of mitral valve regurgitation. No evidence of mitral valve stenosis. MV peak gradient, 6.6 mmHg. The mean mitral valve gradient is 3.0 mmHg. Tricuspid Valve: The tricuspid valve is normal in structure. Tricuspid valve regurgitation is not demonstrated. No evidence of tricuspid stenosis. Aortic Valve: The aortic valve is normal in structure. Aortic valve regurgitation is not visualized. No aortic stenosis is present. Pulmonic Valve: The pulmonic valve was normal in structure. Pulmonic valve regurgitation is not visualized. No evidence of pulmonic stenosis. Aorta: The aortic root is normal in size and structure. Venous: The inferior vena cava is normal in size with greater than 50% respiratory variability, suggesting right atrial pressure of 3 mmHg. IAS/Shunts: No atrial level shunt detected by color flow Doppler.  LEFT VENTRICLE PLAX 2D LVIDd:         4.00 cm LVIDs:         2.80 cm LV PW:         1.20 cm LV IVS:        1.30 cm  RIGHT VENTRICLE RV S prime:     8.35 cm/s LEFT ATRIUM            Index LA diam:      4.90 cm  2.59 cm/m LA Vol (A4C): 190.0 ml 100.59 ml/m  AORTIC VALVE LVOT Vmax:   84.40 cm/s LVOT Vmean:  45.800 cm/s LVOT VTI:    0.109 m  AORTA Ao Asc diam: 3.40 cm MITRAL VALVE MV Area (PHT): 4.49 cm     SHUNTS MV Peak grad:  6.6 mmHg     Systemic VTI: 0.11 m MV Mean grad:  3.0 mmHg MV Vmax:       1.28 m/s MV Vmean:      77.5 cm/s MV Decel Time: 169 msec MV E velocity: 113.00 cm/s Candee Furbish MD Electronically signed by Candee Furbish MD Signature Date/Time: 06/09/2022/11:33:43 AM    Final         Scheduled Meds:  apixaban  5 mg Oral BID   ARIPiprazole  2 mg Oral QHS   atorvastatin  40 mg Oral  Daily   carbamazepine  100 mg Oral QODAY   Chlorhexidine Gluconate Cloth  6 each Topical Daily   escitalopram  10 mg Oral Daily   furosemide  40 mg Intravenous Q12H   icosapent Ethyl  2 g Oral BID   insulin aspart  0-15 Units Subcutaneous TID WC   insulin aspart  4 Units Subcutaneous TID WC   insulin glargine-yfgn  30 Units Subcutaneous Daily   LORazepam  1 mg Oral Once   LORazepam  1 mg Oral QHS   LORazepam  2 mg Oral Daily   metoprolol succinate  125 mg Oral BID   mometasone-formoterol  2 puff Inhalation BID   potassium chloride SA  20 mEq Oral Daily   pregabalin  200 mg Oral BID   senna-docusate  1 tablet Oral BID   sodium chloride flush  3 mL Intravenous Q12H   Continuous Infusions:  sodium chloride     cefTRIAXone (ROCEPHIN)  IV 2 g (06/10/22 1025)     LOS: 2 days    Time spent: 40 minutes    Irine Seal, MD [facility]  To contact the attending provider between 7A-7P or the covering provider during after hours 7P-7A, please log into the web site [email or web address] and access using universal [place] password for that web site. If you do not have the password, please call the hospital operator.  06/10/2022, 12:06 PM

## 2022-06-10 NOTE — Progress Notes (Signed)
Heart Failure Nurse Navigator Progress Note  PCP: [name], MD PCP-Cardiologist: Radford Pax Admission Diagnosis: Hypoxia, Hyperglycemia, Acute dyspnea Admitted from: Home  Presentation:   Amber Stephenson presented to [facility] with "having a hard time catching her breath, patient reported not taking her lasix, because it makes her pee to much". BP 149/86, HR 80, EKG showed Atrial Fibrillation, IV lasix given,   Patient was educated by phone on the sign and symptoms of heart failure, daily weights, when to call her doctor or go to the ED, Diet/ fluid restrictions, taking all medications as prescribed, attending all medical appointments. Patietn verbalized her understanding, and is scheduled for a HF TOC appointment on 06/23/22.    ECHO/ LVEF: 65-70%  Clinical Course:  Past Medical History:  Diagnosis Date   Acute exacerbation of CHF (congestive heart failure) ([place]) 12/13/2020   Acute on chronic diastolic CHF (congestive heart failure)/HFpEF Exacerbation 12/16/2020   Acute on chronic respiratory failure with hypoxia ([place]) 01/22/2019   Acute respiratory failure with hypoxia ([place]) 12/16/2020   Asthma    Atrial fibrillation with RVR 05/19/2017   Bipolar I disorder 01/23/2015   CAD (coronary artery disease)    Nonobstructive; Managed by Dr. Bronson Ing   Cardiomegaly 01/12/2018   Chronic anticoagulation 05/30/2015   Chronic atrial fibrillation ([place]) 01/04/2015   Chronic back pain    Lower back   Chronic diastolic CHF (congestive heart failure) 05/30/2015   Diabetes mellitus, type 2, without complication    Diabetic neuropathy 02/06/2016   Dysrhythmia    A-Fib   Essential hypertension    Herpes genitalis in women 07/16/2015   Hyperlipidemia    Hypoxia 02/01/2019   Insomnia 01/23/2015   Lipoma 02/08/2015   Major depressive disorder    Migraine headache with aura 02/12/2016   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   NAFL (nonalcoholic fatty liver) 74/94/4967   OSA  (obstructive sleep apnea) 02/24/2019   10/09/2018 - HST  - AHI 40.6    Pulmonary hypertension    Renal insufficiency    Managed by Dr. Wallace Keller   RLS (restless legs syndrome) 04/27/2015     Social History   Socioeconomic History   Marital status: Married    Spouse name: Orpah Greek   Number of children: 3   Years of education: 15   Highest education level: Bachelor's degree (e.g., BA, AB, BS)  Occupational History   Occupation: Retired    Comment: marketing  Tobacco Use   Smoking status: Never   Smokeless tobacco: Never  Vaping Use   Vaping Use: Never used  Substance and Sexual Activity   Alcohol use: Not Currently    Alcohol/week: 0.0 standard drinks of alcohol   Drug use: No   Sexual activity: Yes    Partners: Male    Birth control/protection: Post-menopausal  Other Topics Concern   Not on file  Social History Narrative   Lives at home with husband.    They have 3 children who live away - [place], [place], and [place].   She is from [place] and most of her family lives there.   Her husbands's family live nearby   Right handed.   Social Determinants of Health   Financial Resource Strain: Low Risk  (12/13/2021)   Overall Financial Resource Strain (CARDIA)    Difficulty of Paying Living Expenses: Not hard at all  Food Insecurity: No Food Insecurity (06/08/2022)   Hunger Vital Sign    Worried About Running Out of Food in the Last Year: Never true  Ran Out of Food in the Last Year: Never true  Transportation Needs: No Transportation Needs (06/08/2022)   PRAPARE - Hydrologist (Medical): No    Lack of Transportation (Non-Medical): No  Physical Activity: Inactive (12/13/2021)   Exercise Vital Sign    Days of Exercise per Week: 0 days    Minutes of Exercise per Session: 0 min  Stress: No Stress Concern Present (12/13/2021)   [place]    Feeling of Stress : Not at all  Social  Connections: [place] (12/13/2021)   Social Connection and Isolation Panel [NHANES]    Frequency of Communication with Friends and Family: More than three times a week    Frequency of Social Gatherings with Friends and Family: Twice a week    Attends Religious Services: More than 4 times per year    Active Member of [employer] or Organizations: Yes    Attends Music therapist: More than 4 times per year    Marital Status: Married    High Risk Criteria for Readmission and/or Poor Patient Outcomes: Heart failure hospital admissions (last 6 months): 0  No Show rate: 4% Difficult social situation: No Demonstrates medication adherence: No, Lasix ( reported it makes her "pee too much" Primary Language: English Literacy level: Reading, writing, and comprehension  Barriers of Care:   Medication compliance ( stopped lasix, reported it makes her pee too much) Diet/ fluid/ daily weights compliance  Considerations/Referrals:   Referral made to Heart Failure Pharmacist Stewardship: Yes Referral made to Heart Failure CSW/NCM TOC: No Referral made to Heart & Vascular TOC clinic: Yes, 06/23/22  Items for Follow-up on DC/TOC: Medication compliance ( stopped her lasix, reported it made her pee too much"  Diet/ fluid/ weight compliance   Earnestine Leys, BSN, RN Heart Failure Transport planner Only

## 2022-06-11 ENCOUNTER — Inpatient Hospital Stay (HOSPITAL_COMMUNITY): Payer: Medicare Other

## 2022-06-11 ENCOUNTER — Ambulatory Visit: Payer: Medicare Other | Admitting: Family Medicine

## 2022-06-11 DIAGNOSIS — Z7901 Long term (current) use of anticoagulants: Secondary | ICD-10-CM

## 2022-06-11 DIAGNOSIS — J9601 Acute respiratory failure with hypoxia: Secondary | ICD-10-CM | POA: Diagnosis not present

## 2022-06-11 DIAGNOSIS — F3112 Bipolar disorder, current episode manic without psychotic features, moderate: Secondary | ICD-10-CM | POA: Diagnosis not present

## 2022-06-11 DIAGNOSIS — I5033 Acute on chronic diastolic (congestive) heart failure: Secondary | ICD-10-CM | POA: Diagnosis not present

## 2022-06-11 LAB — BASIC METABOLIC PANEL
Anion gap: 7 (ref 5–15)
BUN: 35 mg/dL — ABNORMAL HIGH (ref 8–23)
CO2: 27 mmol/L (ref 22–32)
Calcium: 8.2 mg/dL — ABNORMAL LOW (ref 8.9–10.3)
Chloride: 99 mmol/L (ref 98–111)
Creatinine, Ser: 0.77 mg/dL (ref 0.44–1.00)
GFR, Estimated: >60 mL/min (ref >60)
Glucose, Bld: 277 mg/dL — ABNORMAL HIGH (ref 70–99)
Potassium: 4.6 mmol/L (ref 3.5–5.1)
Sodium: 133 mmol/L — ABNORMAL LOW (ref 135–145)

## 2022-06-11 LAB — CBC WITH DIFFERENTIAL/PLATELET
Abs Immature Granulocytes: 0.05 10*3/uL (ref 0.00–0.07)
Basophils Absolute: 0.1 10*3/uL (ref 0.0–0.1)
Basophils Relative: 1 %
Eosinophils Absolute: 0.2 10*3/uL (ref 0.0–0.5)
Eosinophils Relative: 2 %
HCT: 41.3 % (ref 36.0–46.0)
Hemoglobin: 13.2 g/dL (ref 12.0–15.0)
Immature Granulocytes: 1 %
Lymphocytes Relative: 31 %
Lymphs Abs: 3.4 10*3/uL (ref 0.7–4.0)
MCH: 30.3 pg (ref 26.0–34.0)
MCHC: 32 g/dL (ref 30.0–36.0)
MCV: 94.7 fL (ref 80.0–100.0)
Monocytes Absolute: 0.8 10*3/uL (ref 0.1–1.0)
Monocytes Relative: 7 %
Neutro Abs: 6.3 10*3/uL (ref 1.7–7.7)
Neutrophils Relative %: 58 %
Platelets: 198 10*3/uL (ref 150–400)
RBC: 4.36 MIL/uL (ref 3.87–5.11)
RDW: 13.1 % (ref 11.5–15.5)
WBC: 10.8 10*3/uL — ABNORMAL HIGH (ref 4.0–10.5)
nRBC: 0 % (ref 0.0–0.2)

## 2022-06-11 LAB — GLUCOSE, CAPILLARY
Glucose-Capillary: 221 mg/dL — ABNORMAL HIGH (ref 70–99)
Glucose-Capillary: 242 mg/dL — ABNORMAL HIGH (ref 70–99)
Glucose-Capillary: 244 mg/dL — ABNORMAL HIGH (ref 70–99)
Glucose-Capillary: 240 mg/dL — ABNORMAL HIGH (ref 70–99)

## 2022-06-11 LAB — MAGNESIUM: Magnesium: 1.9 mg/dL (ref 1.7–2.4)

## 2022-06-11 MED ORDER — SENNOSIDES-DOCUSATE SODIUM 8.6-50 MG PO TABS
1.0000 | ORAL_TABLET | Freq: Two times a day (BID) | ORAL | Status: DC
  Administered 2022-06-12 – 2022-06-13 (×3): 1 via ORAL
  Filled 2022-06-11 (×4): qty 1

## 2022-06-11 MED ORDER — POLYETHYLENE GLYCOL 3350 17 G PO PACK
17.0000 g | PACK | Freq: Two times a day (BID) | ORAL | Status: DC
  Administered 2022-06-11: 17 g via ORAL
  Filled 2022-06-11 (×4): qty 1

## 2022-06-11 MED ORDER — BISACODYL 10 MG RE SUPP: 10.0000 mg | Freq: Every day | RECTAL | Status: DC | PRN

## 2022-06-11 NOTE — Progress Notes (Signed)
SATURATION QUALIFICATIONS: (This note is used to comply with regulatory documentation for home oxygen)  Patient Saturations on Room Air at Rest = 91%  Patient Saturations on Room Air while Ambulating = 80%  Patient Saturations on 3 Liters of oxygen while Ambulating = 96%  Please briefly explain why patient needs home oxygen: SpO2 levels drop too low with any sort of exertion.

## 2022-06-11 NOTE — Progress Notes (Signed)
PROGRESS NOTE    Amber Stephenson  IDP:[id number] DOB: Oct 20, 1945 DOA: 06/08/2022 PCP: [name], MD   Brief Narrative:  Patient is a 77 year old obese Caucasian female with a past medical history significant for but not limited to hypertension, diabetes mellitus type 2, complicated by diabetic neuropathy, hyperlipidemia, chronic atrial fibrillation on anticoagulation with Eliquis, bipolar disorder, depression nonobstructive CAD, chronic diastolic CHF, migraine headaches, history of Nash, chronic back pain, history of restless leg syndrome as well as other comorbidities who presents with worsening shortness of breath for last 2 days.  Of note she had been noncompliant with her diuretics and has had some dietary indiscretion.  She was admitted for acute respiratory failure with hypoxia in the setting of acute on chronic diastolic CHF exacerbation and had been placed on IV Lasix and currently diuresing well.  She had a Foley catheter placed in the ED which will now remove and have PT and OT to further evaluate.  PT and OT recommending home health.  She will need an amatory home O2 screen prior to discharge.  Assessment and Plan: No notes have been filed under this hospital service. Service: Hospitalist  Acute Hypoxemic Respiratory Failure likely secondary to acute on Chronic Diastolic CHF -Secondary to medication noncompliance, dietary indiscretions. -Patient on admission with O2 requirements of 2 L nasal cannula. -Patient did state was taking the Lasix every other day to avoid frequent urination as she had stopped to do during the day.  Patient also endorses dietary indiscretions. -Improving clinically. -SpO2: 98 % O2 Flow Rate (L/min): 2 L/min -High-sensitivity troponin noted at 56 trending down to 30 points subsequently to 29.  Patient denies any crushing chest pain. -2D echo done with normal EF,NWMA, dilated left atrium. -Patient is diuresing on Lasix 40 mg IV every 12 hours; Will  Discontinue Foley Catheter -Strict I's and O's and Daily Weights; Patient is -3.344 Liters  -Current weight of 194.89 pounds from 195.77 from 194.45 pounds from 190 pounds on admission, doubt accuracy of weights. -Continue Vascepa, Lipitor, Toprol-XL and diuresis as above. -With continued improvement hopefully could transition to oral Lasix in the next 1 to 2 days.  Monitor renal function and blood pressure. -Repeat chest x-ray this AM -She will need an ambulatory home O2 screen prior to discharge    Headache/history of migraine headaches -Patient with complaints of headache. -Compazine 5 mg IV x 1 followed by Toradol 30 mg IV x 1 given with some improvement with headache. -Placed on Toradol 15 mg IV every 6 hours as needed but will now Stop. -Supportive care.   Hypomagnesemia -Mag Level is now 1.9 -Continue to Monitor and Replete as Necessary -Repeat labs in AM.   Bipolar disorder/depression/anxiety -Continue home regimen Ariprazole 2 mg po qHS, Esctialopram 10 mg po Daily, and Lorazepam 1 mg po qHS and 2 mg po Daily  -Patient noted to be on Carbamazepine 100 mg daily however per pharmacy due to drug drug interaction between Tegretol and Eliquis will taper Tegretol to every other day and patient will need to follow-up with outpatient psychiatrist for further titration and management.   Permanent A-fib -Continue Metoprolol Succinate 125 mg po BID for rate control. -C/w Apixaban 5 mg po BID for anticoagulation. -Continue to monitor on Telemetry   Hyperlipidemia -Continue Atorvastatin 40 mg po Daily and Icosapent Ethyl 2 grams po BID.   Hypertension -Continue Metoprolol Succinate 125 mg po BID and IV Diuresis 40 mg q12h -Continue to hold Lisinopril to allow room for diuresis. -Continue to  Monitor BP per Protocol -Last BP reading was 124/69   Diabetes mellitus type 2 complicated with Diabetic Neuropathy -Hemoglobin A1c 6.8 (05/28/2022) -C/w Semglee 40 units daily. -Place on meal  coverage NovoLog 8 units 3 times daily with meals. -C/w Moderate Novolog SSI AC -Continue Pregablalin 200 mg po BID.    Mild intermittent asthma -Stable. -Continue home regimen bronchodilators with Albuterol 2.5 mg Neb q6hprn Wheezing and SOB and Mometasone-Formoterol 200-5 mcg/act 2 puff IH BID.   Chronic back pain -Continue home regimen of Hydromorphone 2 mg po q6hprn Severe Pain.     History of OSA -Not on CPAP. -Outpatient follow-up.   CAD/elevated troponin -Likely secondary to early demand ischemia in the setting of volume overload. -Troponins trending down. -Patient asymptomatic with no chest pain.   -Continue with Meds as above with metoprolol, statin, diuretics. -2D echo with normal EF, no wall motion abnormalities. -Outpatient follow-up with primary cardiologist, Dr. Radford Pax.   Mild cognitive impairment -Being followed by neurology in the outpatient setting. -Patient has a scheduled neuropsychological evaluation 08/08/2022 for clarity of diagnosis and disease progression.  Hyponatremia -Na+ Trend: Recent Labs  Lab 06/08/22 0759 06/08/22 0851 06/09/22 0419 06/10/22 0415 06/11/22 0414  NA 138 138 137 135 133*  -Continue to Monitor and Trend and repeat CMP in the AM   AKI -BUN/Cr Trend: Recent Labs  Lab 06/08/22 0759 06/08/22 1642 06/09/22 0419 06/10/22 0415 06/11/22 0414  BUN 14  --  19 32* 35*  CREATININE 0.59 0.63 0.75 1.14* 0.77  -Avoid Nephrotoxic Medications, Contrast Dyes, Hypotension and Dehydration to Ensure Adequate Renal Perfusion and will need to Renally Adjust Meds -Continue to Monitor and Trend Renal Function carefully and repeat CMP in the AM   Leukocytosis -WBC Trend: Recent Labs  Lab 06/08/22 0759 06/08/22 1642 06/09/22 0800 06/10/22 0415 06/11/22 0414  WBC 11.4* 13.5* 12.0* 10.2 10.8*  -She has a Chronic Luekoyctosis -Currently afebrile -CXR done and showed " -Continue to Monitor for S/Sx of Infection -Repeat CBC in the AM    Constipation -History of bowel regimen with MiraLAX 17 g p.o. twice daily, senna docusate 1 tab p.o. twice daily, as well as bisacodyl 10 mg rectally daily as needed for moderate constipation  Obesity -Complicates overall prognosis and care -Estimated body mass index is 35.65 kg/m as calculated from the following:   Height as of this encounter: '5\' 2"'$  (1.575 m).   Weight as of this encounter: 88.4 kg.  -Weight Loss and Dietary Counseling given   DVT prophylaxis: SCDs Start: 06/08/22 1633 apixaban (ELIQUIS) tablet 5 mg    Code Status: Full Code Family Communication: Discussed with Husband at bedside   Disposition Plan:  Level of care: Telemetry Status is: Inpatient Remains inpatient appropriate because: Needs further diuresis and improvement back to baseline as well as an ambulatory Home O2 Screen   Consultants:  None  Procedures:  None  Antimicrobials:  Anti-infectives (From admission, onward)    Start     Dose/Rate Route Frequency Ordered Stop   06/10/22 1000  cefTRIAXone (ROCEPHIN) 2 g in sodium chloride 0.9 % 100 mL IVPB        2 g 200 mL/hr over 30 Minutes Intravenous Every 24 hours 06/10/22 0820 06/13/22 0959        Subjective: Seen and examined at bedside and she is doing much better.  Complaining of some constipation.  Also complained about her Foley catheter which were removed today.  She denies any lightheadedness or dizziness.  Thinks her breathing is  doing better.  No other concerns or complaints at this time.  Objective: Vitals:   06/10/22 2139 06/11/22 0211 06/11/22 0427 06/11/22 0500  BP: 113/66 (!) 136/94 124/69   Pulse: 70 (!) 47 75   Resp: '16 18 18   '$ Temp: 97.7 F (36.5 C) 97.6 F (36.4 C) 97.9 F (36.6 C)   TempSrc: Oral Oral Oral   SpO2: 98% 92% 98%   Weight:    88.4 kg  Height:        Intake/Output Summary (Last 24 hours) at 06/11/2022 0810 Last data filed at 06/11/2022 9024 Gross per 24 hour  Intake 700 ml  Output 2550 ml  Net -1850  ml   Filed Weights   06/09/22 0500 06/10/22 0457 06/11/22 0500  Weight: 88.2 kg 88.8 kg 88.4 kg   Examination: Physical Exam:  Constitutional: WN/WD obese elderly Caucasian female in NAD appears calm Respiratory: Diminished to auscultation bilaterally, no wheezing, rales, rhonchi or crackles. Normal respiratory effort and patient is not tachypenic. No accessory muscle use. Unlabored breathing but wearing Supplemental O2 via [place] Cardiovascular: RRR, no murmurs / rubs / gallops. S1 and S2 auscultated. Trace Extremity Edema Abdomen: Soft, non-tender, Distended 2/2 body habitus. Bowel sounds positive.  GU: Deferred. Foley Catheter is in place Musculoskeletal: No clubbing / cyanosis of digits/nails. No joint deformity upper and lower extremities.  Skin: No rashes, lesions, ulcers on a limited skin evaluation. No induration; Warm and dry.  Neurologic: CN 2-12 grossly intact with no focal deficits. Romberg sign and cerebellar reflexes not assessed.  Psychiatric: Normal judgment and insight. Alert and oriented x 3. Normal mood and appropriate affect.   Data Reviewed: I have personally reviewed following labs and imaging studies  CBC: Recent Labs  Lab 06/08/22 0759 06/08/22 0851 06/08/22 1642 06/09/22 0800 06/10/22 0415 06/11/22 0414  WBC 11.4*  --  13.5* 12.0* 10.2 10.8*  NEUTROABS  --   --   --  7.7 6.6 6.3  HGB 14.0 13.6 13.8 14.0 13.1 13.2  HCT 41.7 40.0 41.3 43.9 41.5 41.3  MCV 90.8  --  91.8 95.6 95.8 94.7  PLT 191  --  211 221 193 097   Basic Metabolic Panel: Recent Labs  Lab 06/08/22 0759 06/08/22 0851 06/08/22 1642 06/09/22 0419 06/10/22 0415 06/11/22 0414  NA 138 138  --  137 135 133*  K 4.2 4.1  --  3.9 4.4 4.6  CL 103  --   --  102 102 99  CO2 18*  --   --  '27 26 27  '$ GLUCOSE 295*  --   --  121* 171* 277*  BUN 14  --   --  19 32* 35*  CREATININE 0.59  --  0.63 0.75 1.14* 0.77  CALCIUM 9.2  --   --  8.9 8.5* 8.2*  MG  --   --  1.2* 1.6* 2.4 1.9    GFR: Estimated Creatinine Clearance: 62.7 mL/min (by C-G formula based on SCr of 0.77 mg/dL). Liver Function Tests: Recent Labs  Lab 06/08/22 0759  AST 19  ALT 8  ALKPHOS 94  BILITOT 0.5  PROT 7.0  ALBUMIN 4.0   No results for input(s): "LIPASE", "AMYLASE" in the last 168 hours. No results for input(s): "AMMONIA" in the last 168 hours. Coagulation Profile: No results for input(s): "INR", "PROTIME" in the last 168 hours. Cardiac Enzymes: No results for input(s): "CKTOTAL", "CKMB", "CKMBINDEX", "TROPONINI" in the last 168 hours. BNP (last 3 results) No results for input(s): "PROBNP"  in the last 8760 hours. HbA1C: No results for input(s): "HGBA1C" in the last 72 hours. CBG: Recent Labs  Lab 06/10/22 0750 06/10/22 1157 06/10/22 1616 06/10/22 2136 06/11/22 0741  GLUCAP 330* 434* 403* 298* 221*   Lipid Profile: No results for input(s): "CHOL", "HDL", "LDLCALC", "TRIG", "CHOLHDL", "LDLDIRECT" in the last 72 hours. Thyroid Function Tests: No results for input(s): "TSH", "T4TOTAL", "FREET4", "T3FREE", "THYROIDAB" in the last 72 hours. Anemia Panel: No results for input(s): "VITAMINB12", "FOLATE", "FERRITIN", "TIBC", "IRON", "RETICCTPCT" in the last 72 hours. Sepsis Labs: No results for input(s): "PROCALCITON", "LATICACIDVEN" in the last 168 hours.  Recent Results (from the past 240 hour(s))  Resp panel by RT-PCR (RSV, Flu A&B, Covid) Anterior Nasal Swab     Status: None   Collection Time: 06/08/22  7:59 AM   Specimen: Anterior Nasal Swab  Result Value Ref Range Status   SARS Coronavirus 2 by RT PCR NEGATIVE NEGATIVE Final    Comment: (NOTE) SARS-CoV-2 target nucleic acids are NOT DETECTED.  The SARS-CoV-2 RNA is generally detectable in upper respiratory specimens during the acute phase of infection. The lowest concentration of SARS-CoV-2 viral copies this assay can detect is 138 copies/mL. A negative result does not preclude SARS-Cov-2 infection and should not be  used as the sole basis for treatment or other patient management decisions. A negative result may occur with  improper specimen collection/handling, submission of specimen other than nasopharyngeal swab, presence of viral mutation(s) within the areas targeted by this assay, and inadequate number of viral copies(<138 copies/mL). A negative result must be combined with clinical observations, patient history, and epidemiological information. The expected result is Negative.  Fact Sheet for Patients:  EntrepreneurPulse.com.au  Fact Sheet for Healthcare Providers:  IncredibleEmployment.be  This test is no t yet approved or cleared by the [place] FDA and  has been authorized for detection and/or diagnosis of SARS-CoV-2 by FDA under an Emergency Use Authorization (EUA). This EUA will remain  in effect (meaning this test can be used) for the duration of the COVID-19 declaration under Section 564(b)(1) of the Act, 21 U.S.C.section 360bbb-3(b)(1), unless the authorization is terminated  or revoked sooner.       Influenza A by PCR NEGATIVE NEGATIVE Final   Influenza B by PCR NEGATIVE NEGATIVE Final    Comment: (NOTE) The Xpert Xpress SARS-CoV-2/FLU/RSV plus assay is intended as an aid in the diagnosis of influenza from Nasopharyngeal swab specimens and should not be used as a sole basis for treatment. Nasal washings and aspirates are unacceptable for Xpert Xpress SARS-CoV-2/FLU/RSV testing.  Fact Sheet for Patients: EntrepreneurPulse.com.au  Fact Sheet for Healthcare Providers: IncredibleEmployment.be  This test is not yet approved or cleared by the [place] FDA and has been authorized for detection and/or diagnosis of SARS-CoV-2 by FDA under an Emergency Use Authorization (EUA). This EUA will remain in effect (meaning this test can be used) for the duration of the COVID-19 declaration under Section  564(b)(1) of the Act, 21 U.S.C. section 360bbb-3(b)(1), unless the authorization is terminated or revoked.     Resp Syncytial Virus by PCR NEGATIVE NEGATIVE Final    Comment: (NOTE) Fact Sheet for Patients: EntrepreneurPulse.com.au  Fact Sheet for Healthcare Providers: IncredibleEmployment.be  This test is not yet approved or cleared by the [place] FDA and has been authorized for detection and/or diagnosis of SARS-CoV-2 by FDA under an Emergency Use Authorization (EUA). This EUA will remain in effect (meaning this test can be used) for the duration of the COVID-19  declaration under Section 564(b)(1) of the Act, 21 U.S.C. section 360bbb-3(b)(1), unless the authorization is terminated or revoked.  Performed at [facility], [address] [place], [place] [address]   Urine Culture     Status: None   Collection Time: 06/09/22  9:11 AM   Specimen: Urine, Catheterized  Result Value Ref Range Status   Specimen Description   Final    URINE, CATHETERIZED Performed at [facility] [address] [place], [place] [address]    Special Requests   Final    NONE Performed at [facility], [place] [address] [place], [place] [address]    Culture   Final    NO GROWTH Performed at [facility], [place] [address] [place],  [address]    Report Status 06/10/2022 FINAL  Final     Radiology Studies: ECHOCARDIOGRAM COMPLETE  Result Date: 06/09/2022    ECHOCARDIOGRAM REPORT   Patient Name:   Amber Stephenson Cyr Date of Exam: 06/09/2022 Medical Rec #:  [id number]         Height:       62.0 in Accession #:    [id number]        Weight:       194.4 lb Date of Birth:  1946-04-13          BSA:          1.889 m Patient Age:    15 years          BP:           106/75 mmHg Patient Gender: F                 HR:           105 bpm. Exam Location:  Inpatient Procedure: 2D Echo, Cardiac Doppler and Color  Doppler Indications:    CHF  History:        Patient has prior history of Echocardiogram examinations. CAD,                 Arrythmias:Atrial Fibrillation; Risk Factors:Hypertension,                 Dyslipidemia and Diabetes.  Sonographer:    Danne Baxter RDCS, FE, PE Referring Phys: [phone] [place]  Sonographer Comments: Technically difficult study due to poor echo windows and suboptimal parasternal window. IMPRESSIONS  1. Left ventricular ejection fraction, by estimation, is 65 to 70%. The left ventricle has normal function. The left ventricle has no regional wall motion abnormalities. There is mild left ventricular hypertrophy. Left ventricular diastolic parameters are indeterminate.  2. Right ventricular systolic function is normal. The right ventricular size is normal.  3. Left atrial size was severely dilated.  4. The mitral valve is normal in structure. No evidence of mitral valve regurgitation. No evidence of mitral stenosis.  5. The aortic valve is normal in structure. Aortic valve regurgitation is not visualized. No aortic stenosis is present.  6. The inferior vena cava is normal in size with greater than 50% respiratory variability, suggesting right atrial pressure of 3 mmHg. FINDINGS  Left Ventricle: Left ventricular ejection fraction, by estimation, is 65 to 70%. The left ventricle has normal function. The left ventricle has no regional wall motion abnormalities. The left ventricular internal cavity size was normal in size. There is  mild left ventricular hypertrophy. Left ventricular diastolic parameters are indeterminate. Right Ventricle: The right ventricular size is normal. No increase in right ventricular  wall thickness. Right ventricular systolic function is normal. Left Atrium: Left atrial size was severely dilated. Right Atrium: Right atrial size was normal in size. Pericardium: There is no evidence of pericardial effusion. Mitral Valve: The mitral valve is normal in structure. No  evidence of mitral valve regurgitation. No evidence of mitral valve stenosis. MV peak gradient, 6.6 mmHg. The mean mitral valve gradient is 3.0 mmHg. Tricuspid Valve: The tricuspid valve is normal in structure. Tricuspid valve regurgitation is not demonstrated. No evidence of tricuspid stenosis. Aortic Valve: The aortic valve is normal in structure. Aortic valve regurgitation is not visualized. No aortic stenosis is present. Pulmonic Valve: The pulmonic valve was normal in structure. Pulmonic valve regurgitation is not visualized. No evidence of pulmonic stenosis. Aorta: The aortic root is normal in size and structure. Venous: The inferior vena cava is normal in size with greater than 50% respiratory variability, suggesting right atrial pressure of 3 mmHg. IAS/Shunts: No atrial level shunt detected by color flow Doppler.  LEFT VENTRICLE PLAX 2D LVIDd:         4.00 cm LVIDs:         2.80 cm LV PW:         1.20 cm LV IVS:        1.30 cm  RIGHT VENTRICLE RV S prime:     8.35 cm/s LEFT ATRIUM            Index LA diam:      4.90 cm  2.59 cm/m LA Vol (A4C): 190.0 ml 100.59 ml/m  AORTIC VALVE LVOT Vmax:   84.40 cm/s LVOT Vmean:  45.800 cm/s LVOT VTI:    0.109 m  AORTA Ao Asc diam: 3.40 cm MITRAL VALVE MV Area (PHT): 4.49 cm     SHUNTS MV Peak grad:  6.6 mmHg     Systemic VTI: 0.11 m MV Mean grad:  3.0 mmHg MV Vmax:       1.28 m/s MV Vmean:      77.5 cm/s MV Decel Time: 169 msec MV E velocity: 113.00 cm/s Candee Furbish MD Electronically signed by Candee Furbish MD Signature Date/Time: 06/09/2022/11:33:43 AM    Final     Scheduled Meds:  apixaban  5 mg Oral BID   ARIPiprazole  2 mg Oral QHS   atorvastatin  40 mg Oral Daily   carbamazepine  100 mg Oral QODAY   Chlorhexidine Gluconate Cloth  6 each Topical Daily   escitalopram  10 mg Oral Daily   furosemide  40 mg Intravenous Q12H   icosapent Ethyl  2 g Oral BID   insulin aspart  0-15 Units Subcutaneous TID WC   insulin aspart  8 Units Subcutaneous TID WC   insulin  glargine-yfgn  40 Units Subcutaneous Daily   LORazepam  1 mg Oral Once   LORazepam  1 mg Oral QHS   LORazepam  2 mg Oral Daily   metoprolol succinate  125 mg Oral BID   mometasone-formoterol  2 puff Inhalation BID   potassium chloride SA  20 mEq Oral Daily   pregabalin  200 mg Oral BID   senna-docusate  1 tablet Oral BID   sodium chloride flush  3 mL Intravenous Q12H   Continuous Infusions:  sodium chloride     cefTRIAXone (ROCEPHIN)  IV Stopped (06/10/22 1055)    LOS: 3 days   Raiford Noble, DO [facility] Available via Epic secure chat 7am-7pm After these hours, please refer to coverage provider listed on [email or web address] 06/11/2022, 8:10  AM  

## 2022-06-11 NOTE — Progress Notes (Signed)
Occupational Therapy Treatment Patient Details Name: Amber Stephenson MRN: [id number] DOB: 1945-12-12 Today's Date: 06/11/2022   History of present illness Patient is a 77 year old female admitted with acute on chronic heart failure, acute resp failure, headache. Hx of chronic back pain, obesity, medical noncompliance, bipolar d/o, neuropathy, pulm HTN, DM, RLS, Afib, CAD   OT comments  Patient is progressing towards goals. Patient and husband were educated on AE use to increase independence in LB dressing/bathing tasks. Patient was supervision to don/doff pants and socks sitting EOB. Patient and husband reported they would look into getting these AE items for home.    Recommendations for follow up therapy are one component of a multi-disciplinary discharge planning process, led by the attending physician.  Recommendations may be updated based on patient status, additional functional criteria and insurance authorization.    Follow Up Recommendations  Home health OT     Assistance Recommended at Discharge Frequent or constant Supervision/Assistance  Patient can return home with the following  A lot of help with bathing/dressing/bathroom;Assistance with cooking/housework;Direct supervision/assist for medications management;Assist for transportation;Help with stairs or ramp for entrance;Direct supervision/assist for financial management;A little help with walking and/or transfers   Equipment Recommendations  None recommended by OT    Recommendations for Other Services      Precautions / Restrictions Precautions Precautions: Fall Precaution Comments: monitor O2 Restrictions Weight Bearing Restrictions: No              ADL either performed or assessed with clinical judgement   ADL Overall ADL's : Needs assistance/impaired                     Lower Body Dressing: Supervision/safety;With adaptive equipment Lower Body Dressing Details (indicate cue type and reason):  patient was educated on AE for LB dressing/bathing tasks with husband present as well. patient was able to don/doff socks with AE with supervision and occasional cues for proper sequencing. patient and husband were educated on total hip kits and where to purchase them. patient and husband verbalized understanding. patient was able to don pants with reacher from feet to ankles with supervision and increased time.                      Cognition Arousal/Alertness: Awake/alert Behavior During Therapy: WFL for tasks assessed/performed Overall Cognitive Status: Within Functional Limits for tasks assessed         General Comments: husband was present as well.                   Pertinent Vitals/ Pain       Pain Assessment Pain Assessment: Faces Faces Pain Scale: Hurts a little bit Pain Location: back pain with activity Pain Descriptors / Indicators: Grimacing, Discomfort, Sore Pain Intervention(s): Limited activity within patient's tolerance, Monitored during session         Frequency  Min 2X/week        Progress Toward Goals  OT Goals(current goals can now be found in the care plan section)  Progress towards OT goals: Progressing toward goals     Plan Discharge plan remains appropriate       AM-PAC OT "6 Clicks" Daily Activity     Outcome Measure   Help from another person eating meals?: None Help from another person taking care of personal grooming?: A Little Help from another person toileting, which includes using toliet, bedpan, or urinal?: A Little Help from another person bathing (including washing, rinsing,  drying)?: A Little Help from another person to put on and taking off regular upper body clothing?: A Little Help from another person to put on and taking off regular lower body clothing?: A Little (with AE) 6 Click Score: 19    End of Session Equipment Utilized During Treatment: Oxygen  OT Visit Diagnosis: Unsteadiness on feet (R26.81);Other  abnormalities of gait and mobility (R26.89)   Activity Tolerance Patient tolerated treatment well   Patient Left in bed;with call bell/phone within reach;with family/visitor present   Nurse Communication Other (comment) (ok to participate in session)        Time: 5945-8592 OT Time Calculation (min): 14 min  Charges: OT General Charges $OT Visit: 1 Visit OT Treatments $Self Care/Home Management : 8-22 mins  Rennie Plowman, MS Acute Rehabilitation Department Office# [phone]   Willa Rough 06/11/2022, 4:28 PM

## 2022-06-12 ENCOUNTER — Inpatient Hospital Stay (HOSPITAL_COMMUNITY): Payer: Medicare Other

## 2022-06-12 DIAGNOSIS — G2581 Restless legs syndrome: Secondary | ICD-10-CM

## 2022-06-12 DIAGNOSIS — F3112 Bipolar disorder, current episode manic without psychotic features, moderate: Secondary | ICD-10-CM | POA: Diagnosis not present

## 2022-06-12 DIAGNOSIS — I5033 Acute on chronic diastolic (congestive) heart failure: Secondary | ICD-10-CM | POA: Diagnosis not present

## 2022-06-12 DIAGNOSIS — Z7901 Long term (current) use of anticoagulants: Secondary | ICD-10-CM | POA: Diagnosis not present

## 2022-06-12 DIAGNOSIS — J9601 Acute respiratory failure with hypoxia: Secondary | ICD-10-CM | POA: Diagnosis not present

## 2022-06-12 LAB — COMPREHENSIVE METABOLIC PANEL
ALT: 16 U/L (ref 0–44)
AST: 19 U/L (ref 15–41)
Albumin: 3.4 g/dL — ABNORMAL LOW (ref 3.5–5.0)
Alkaline Phosphatase: 108 U/L (ref 38–126)
Anion gap: 6 (ref 5–15)
BUN: 42 mg/dL — ABNORMAL HIGH (ref 8–23)
CO2: 27 mmol/L (ref 22–32)
Calcium: 8.2 mg/dL — ABNORMAL LOW (ref 8.9–10.3)
Chloride: 101 mmol/L (ref 98–111)
Creatinine, Ser: 0.87 mg/dL (ref 0.44–1.00)
GFR, Estimated: >60 mL/min (ref >60)
Glucose, Bld: 412 mg/dL — ABNORMAL HIGH (ref 70–99)
Potassium: 4.8 mmol/L (ref 3.5–5.1)
Sodium: 134 mmol/L — ABNORMAL LOW (ref 135–145)
Total Bilirubin: 0.3 mg/dL (ref 0.3–1.2)
Total Protein: 6.6 g/dL (ref 6.5–8.1)

## 2022-06-12 LAB — GLUCOSE, CAPILLARY
Glucose-Capillary: 304 mg/dL — ABNORMAL HIGH (ref 70–99)
Glucose-Capillary: 233 mg/dL — ABNORMAL HIGH (ref 70–99)
Glucose-Capillary: 332 mg/dL — ABNORMAL HIGH (ref 70–99)
Glucose-Capillary: 292 mg/dL — ABNORMAL HIGH (ref 70–99)

## 2022-06-12 LAB — CBC WITH DIFFERENTIAL/PLATELET
Abs Immature Granulocytes: 0.04 10*3/uL (ref 0.00–0.07)
Basophils Absolute: 0.1 10*3/uL (ref 0.0–0.1)
Basophils Relative: 1 %
Eosinophils Absolute: 0.2 10*3/uL (ref 0.0–0.5)
Eosinophils Relative: 2 %
HCT: 40.2 % (ref 36.0–46.0)
Hemoglobin: 12.9 g/dL (ref 12.0–15.0)
Immature Granulocytes: 1 %
Lymphocytes Relative: 29 %
Lymphs Abs: 2.6 10*3/uL (ref 0.7–4.0)
MCH: 30.5 pg (ref 26.0–34.0)
MCHC: 32.1 g/dL (ref 30.0–36.0)
MCV: 95 fL (ref 80.0–100.0)
Monocytes Absolute: 0.7 10*3/uL (ref 0.1–1.0)
Monocytes Relative: 8 %
Neutro Abs: 5.3 10*3/uL (ref 1.7–7.7)
Neutrophils Relative %: 59 %
Platelets: 208 10*3/uL (ref 150–400)
RBC: 4.23 MIL/uL (ref 3.87–5.11)
RDW: 13.2 % (ref 11.5–15.5)
WBC: 8.9 10*3/uL (ref 4.0–10.5)
nRBC: 0 % (ref 0.0–0.2)

## 2022-06-12 LAB — PHOSPHORUS: Phosphorus: 3.3 mg/dL (ref 2.5–4.6)

## 2022-06-12 LAB — MAGNESIUM: Magnesium: 2.1 mg/dL (ref 1.7–2.4)

## 2022-06-12 MED ORDER — FUROSEMIDE 10 MG/ML IJ SOLN
60.0000 mg | Freq: Two times a day (BID) | INTRAMUSCULAR | Status: DC
  Administered 2022-06-12 – 2022-06-13 (×2): 60 mg via INTRAVENOUS
  Filled 2022-06-12 (×2): qty 6

## 2022-06-12 MED ORDER — OXCARBAZEPINE 150 MG PO TABS
150.0000 mg | ORAL_TABLET | Freq: Two times a day (BID) | ORAL | Status: DC
  Administered 2022-06-12 – 2022-06-13 (×2): 150 mg via ORAL
  Filled 2022-06-12 (×2): qty 1

## 2022-06-12 NOTE — TOC Initial Note (Signed)
Transition of Care [facility]) - Initial/Assessment Note    Patient Details  Name: Amber Stephenson MRN: [id number] Date of Birth: 02/16/1946  Transition of Care [facility]) CM/SW Contact:    Illene Regulus, LCSW Phone Number: 06/12/2022, 11:35 AM  Clinical Narrative:                 CSW spoke with pt about recommendations for [facility] services , pt is agreeable and would like [facility] as she had it in the past. CSW sent referral out to [facility], pt was accepted for HHPT/OT. Pt reported no DME needs. Awaiting HH orders. TOC to follow for d/c needs .  Expected Discharge Plan: [place] Barriers to Discharge: Continued Medical Work up   Patient Goals and CMS Choice Patient states their goals for this hospitalization and ongoing recovery are:: return home CMS Medicare.gov Compare Post Acute Care list provided to:: Patient Choice offered to / list presented to : Patient      Expected Discharge Plan and Services   Discharge Planning Services: CM Consult   Living arrangements for the past 2 months: Single Family Home                           HH Arranged: PT, OT   Date HH Agency Contacted: 06/12/22 Time [place]: 8 Representative spoke with at [place]: [place] Arrangements/Services Living arrangements for the past 2 months: [place] with:: Self Patient language and need for interpreter reviewed:: Yes Do you feel safe going back to the place where you live?: Yes      Need for Family Participation in Patient Care: No (Comment) Care giver support system in place?: No (comment)   Criminal Activity/Legal Involvement Pertinent to Current Situation/Hospitalization: No - Comment as needed  Activities of Daily Living Home Assistive Devices/Equipment: None ADL Screening (condition at time of admission) Patient's cognitive ability adequate to safely complete daily activities?: Yes Is the patient deaf or have difficulty hearing?:  No Does the patient have difficulty seeing, even when wearing glasses/contacts?: No Does the patient have difficulty concentrating, remembering, or making decisions?: No Patient able to express need for assistance with ADLs?: No Does the patient have difficulty dressing or bathing?: No Independently performs ADLs?: Yes (appropriate for developmental age) Does the patient have difficulty walking or climbing stairs?: No Weakness of Legs: None Weakness of Arms/Hands: None  Permission Sought/Granted                  Emotional Assessment Appearance:: Appears stated age Attitude/Demeanor/Rapport: Gracious Affect (typically observed): Accepting Orientation: : Oriented to Self, Oriented to Place, Oriented to  Time, Oriented to Situation Alcohol / Substance Use: Not Applicable Psych Involvement: No (comment)  Admission diagnosis:  Hyperglycemia [R73.9] Hypoxia [R09.02] Noncompliance with medications [Z91.148] Acute dyspnea [R06.00] Acute on chronic diastolic (congestive) heart failure (HCC) [I50.33] CHF (congestive heart failure) (HCC) [I50.9] Patient Active Problem List   Diagnosis Date Noted   Acute respiratory failure with hypoxemia ([place]) 06/09/2022   Acute on chronic diastolic (congestive) heart failure ([place]) 06/08/2022   CHF (congestive heart failure) ([place]) 06/08/2022   Positive fecal occult blood test 03/11/2022   Rectal bleeding 03/11/2022   Hemorrhoids 03/11/2022   Rectal itching 03/11/2022   Encounter for screening fecal occult blood testing 03/11/2022   Microalbuminuria 01/21/2022   Tremor, essential 12/11/2021   Superficial fungus infection of skin 09/03/2021   Chronic right-sided low back  pain with right-sided sciatica 07/19/2021   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   Vaginal itching 04/16/2021   Dysuria 04/16/2021   Sensory ataxia 03/28/2021   Yeast infection 01/14/2021   UTI (urinary tract infection) 12/14/2020   Elevated brain natriuretic peptide (BNP)  level 12/14/2020   Elevated troponin 12/14/2020   Chronic post-traumatic stress disorder 12/06/2020   Tremor 12/06/2020   Senile nuclear sclerosis 12/06/2020   Senile corneal changes 12/06/2020   Psychoses ([place]) 12/06/2020   Presbyopia 12/06/2020   Postmenopausal bleeding 12/06/2020   Postcoital bleeding 12/06/2020   Pinguecula 12/06/2020   Pain in joint, shoulder region 12/06/2020   Other psychological or physical stress 12/06/2020   Other acquired hammer toe 12/06/2020   Osteopenia 12/06/2020   Opioid dependence ([place]) 12/06/2020   Non-neoplastic nevus 12/06/2020   Essential hypertriglyceridemia 12/06/2020   Hypermetropia 12/06/2020   Debility 12/06/2020   Cystitis 12/06/2020   Depressed bipolar I disorder in full remission ([place]) 12/06/2020   Benzodiazepine dependence ([place]) 12/06/2020   Benign neoplasm of skin 12/06/2020   Anxiety 12/06/2020   Functional diarrhea 10/26/2020   Diabetes mellitus due to underlying condition with hyperglycemia, with long-term current use of insulin ([place]) 09/24/2020   Dyslipidemia 09/24/2020   Head trauma 09/17/2020   Chronic respiratory failure with hypoxia ([place]) 04/25/2020   Chronic constipation 04/25/2020   Back pain/sacroiliitis--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   Ambulatory dysfunction--back pain and falls 04/23/2020   Chronic pain syndrome 08/22/2019   Nerve pain 08/22/2019   Myofascial pain dysfunction syndrome 08/22/2019   CAD (coronary artery disease)    Hypocalcemia    S/P shoulder replacement, 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   History of adenomatous polyp of colon 05/21/2019   Polypharmacy 05/21/2019   Benign paroxysmal positional vertigo due to bilateral vestibular disorder 05/20/2019   Mixed hyperlipidemia 05/20/2019   Vertigo 04/25/2019   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   OSA (obstructive sleep apnea) 02/24/2019   Allergic rhinitis 02/24/2019   Osteoarthritis of  shoulder 08/17/2018   Morbid obesity with alveolar hypoventilation ([place]) 07/06/2018   Obesity (BMI 30-39.9) 07/06/2018   At risk for adverse drug event 07/06/2018   Cardiomegaly 93/79/0240   Non-alcoholic fatty liver disease 01/12/2018   Mild intermittent asthma 01/12/2018   Senile osteoporosis 12/15/2017   Fibromyalgia 03/19/2017   Migraine headache with aura 02/12/2016   Diabetic neuropathy associated with type 2 diabetes mellitus ([place]) 02/06/2016   Bipolar 1 disorder, manic, moderate ([place]) 10/04/2015   Depression, recurrent ([place]) 10/03/2015   Herpes genitalis in women 07/16/2015   Long term current use of anticoagulant 05/30/2015   Family history of coronary arteriosclerosis 05/30/2015   Chronic diastolic heart failure ([place]) 05/30/2015   Dyspnea on exertion    Essential hypertension    Restless legs 04/27/2015   Lipoma 02/08/2015   Insomnia 01/23/2015   Chronic back pain 01/04/2015   Chronic atrial fibrillation ([place]) 01/04/2015   PCP:  [name], MD Pharmacy:   [facility] [place], [place] - [place] AT [place] [address] [place] [address] Phone: [phone] Fax: [phone]  [facility] [place], [place] [address] [place] [address] Phone: [phone] Fax: [place] [address] Phone: [phone] Fax: [phone]     Social Determinants of Health (SDOH) Social History: SDOH Screenings  Food Insecurity: No Food Insecurity (06/08/2022)  Housing: Low Risk  (06/08/2022)  Transportation Needs: No Transportation Needs (06/08/2022)  Utilities: Not At Risk (06/08/2022)  Alcohol Screen: Low Risk  (12/13/2021)  Depression (PHQ2-9): High Risk (04/17/2022)  Financial Resource Strain: Low Risk  (12/13/2021)  Physical Activity: Inactive (12/13/2021)  Social Connections:  Socially Integrated (12/13/2021)  Stress: No Stress Concern Present (12/13/2021)  Tobacco Use: Low Risk  (06/08/2022)   SDOH Interventions:     Readmission Risk Interventions     No data to display

## 2022-06-12 NOTE — Progress Notes (Signed)
Physical Therapy Treatment Patient Details Name: Amber Stephenson MRN: [id number] DOB: [birth year] Today's Date: 06/12/2022   History of Present Illness Patient is a 77 year old female admitted with acute on chronic heart failure, acute resp failure, headache. Hx of chronic back pain, obesity, medical noncompliance, bipolar d/o, neuropathy, pulm HTN, DM, RLS, Afib, CAD    PT Comments    Pt assisted with ambulating in hallway and improved distance to 180 ft today.  Pt declined use of walker and instead performed "furniture walking" with min assist at times to ensure stability/safety.  SPO2 94% during ambulation on room air.    Recommendations for follow up therapy are one component of a multi-disciplinary discharge planning process, led by the attending physician.  Recommendations may be updated based on patient status, additional functional criteria and insurance authorization.  Follow Up Recommendations  Home health PT     Assistance Recommended at Discharge PRN  Patient can return home with the following A little help with walking and/or transfers;A little help with bathing/dressing/bathroom;Assistance with cooking/housework;Direct supervision/assist for financial management;Assist for transportation;Help with stairs or ramp for entrance   Equipment Recommendations  None recommended by PT    Recommendations for Other Services       Precautions / Restrictions Precautions Precautions: Fall Precaution Comments: monitor O2     Mobility  Bed Mobility Overal bed mobility: Needs Assistance Bed Mobility: Supine to Sit     Supine to sit: Min guard     General bed mobility comments: reliant on UE assist to bring trunk upright (from rail and therapist)    Transfers Overall transfer level: Needs assistance Equipment used: None Transfers: Sit to/from Stand Sit to Stand: Min guard           General transfer comment: min/guard for safety    Ambulation/Gait Ambulation/Gait  assistance: Min guard, Min assist Gait Distance (Feet): 180 Feet Assistive device: None Gait Pattern/deviations: Step-through pattern, Decreased stride length Gait velocity: decr     General Gait Details: intermittent light min assist due to impaired balance; pt states "I'm not gonna fall" when questioned about unsteadiness; pt utilized hand rails and objects as "furniture walking" (apparently her baseline) as she declined using walker today; Spo2 monitored throughout gait and 95% on room air   Stairs             Wheelchair Mobility    Modified Rankin (Stroke Patients Only)       Balance Overall balance assessment: Needs assistance         Standing balance support: No upper extremity supported Standing balance-Leahy Scale: Fair Standing balance comment: static fair                            Cognition Arousal/Alertness: Awake/alert Behavior During Therapy: WFL for tasks assessed/performed Overall Cognitive Status: Within Functional Limits for tasks assessed                                          Exercises      General Comments        Pertinent Vitals/Pain Pain Assessment Pain Assessment: No/denies pain Pain Intervention(s): Repositioned, Monitored during session    Home Living                          Prior Function  PT Goals (current goals can now be found in the care plan section) Progress towards PT goals: Progressing toward goals    Frequency    Min 3X/week      PT Plan Current plan remains appropriate    Co-evaluation              AM-PAC PT "6 Clicks" Mobility   Outcome Measure  Help needed turning from your back to your side while in a flat bed without using bedrails?: A Little Help needed moving from lying on your back to sitting on the side of a flat bed without using bedrails?: A Little Help needed moving to and from a bed to a chair (including a wheelchair)?: A  Little Help needed standing up from a chair using your arms (e.g., wheelchair or bedside chair)?: A Little Help needed to walk in hospital room?: A Little Help needed climbing 3-5 steps with a railing? : A Little 6 Click Score: 18    End of Session   Activity Tolerance: Patient tolerated treatment well Patient left: in chair;with call bell/phone within reach Nurse Communication: Mobility status PT Visit Diagnosis: Muscle weakness (generalized) (M62.81);Difficulty in walking, not elsewhere classified (R26.2)     Time: 1355-1405 PT Time Calculation (min) (ACUTE ONLY): 10 min  Charges:  $Gait Training: 8-22 mins                     Arlyce Dice, DPT Physical Therapist Acute Rehabilitation Services Preferred contact method: Secure Chat Weekend Pager Only: [phone] Office: [place] 06/12/2022, 2:59 PM

## 2022-06-12 NOTE — Progress Notes (Signed)
SATURATION QUALIFICATIONS: (This note is used to comply with regulatory documentation for home oxygen)  Patient Saturations on Room Air at Rest = 96%  Patient Saturations on Room Air while Ambulating = 94%  Please briefly explain why patient needs home oxygen: Pt does not need needs O2.

## 2022-06-12 NOTE — Progress Notes (Signed)
PROGRESS NOTE    Amber Stephenson  KGU:[id number] DOB: [birth year] DOA: 06/08/2022 PCP: [name], MD   Brief Narrative:  Patient is a 77 year old obese Caucasian female with a past medical history significant for but not limited to hypertension, diabetes mellitus type 2, complicated by diabetic neuropathy, hyperlipidemia, chronic atrial fibrillation on anticoagulation with Eliquis, bipolar disorder, depression nonobstructive CAD, chronic diastolic CHF, migraine headaches, history of [place], chronic back pain, history of restless leg syndrome as well as other comorbidities who presents with worsening shortness of breath for last 2 days. Of note she had been noncompliant with her diuretics and has had some dietary indiscretion. She was admitted for acute respiratory failure with hypoxia in the setting of acute on chronic diastolic CHF exacerbation and had been placed on IV Lasix and currently diuresing well. She had a Foley catheter placed in the ED which will now remove and have PT and OT to further evaluate. PT and OT recommending home health. She will need an Ambulatory home O2 screen prior to discharge.  She desaturated yesterday on the ambulatory home O2 screen but today she did not.  Will repeat it in the morning and anticipating discharging home in next 24 to 48 hours with close cardiac follow-up in the outpatient setting.   Assessment and Plan:  Acute Hypoxemic Respiratory Failure likely secondary to acute on Chronic Diastolic CHF -Secondary to medication noncompliance, dietary indiscretions. -Patient on admission with O2 requirements of 2 L nasal cannula. -Patient did state was taking the Lasix every other day to avoid frequent urination as she had stopped to do during the day.  Patient also endorses dietary indiscretions. -Improving clinically. -SpO2: 96 % O2 Flow Rate (L/min): 2 L/min -High-sensitivity troponin noted at 56 trending down to 30 points subsequently to 29.  Patient denies  any crushing chest pain. -2D echo done with normal EF,NWMA, dilated left atrium. -Patient is diuresing on Lasix 40 mg IV every 12 hours and increase to 60 mg q12h; Will Discontinue Foley Catheter -Strict I's and O's and Daily Weights; Patient is -2.564 Liters  -Current weight of 194.89 pounds from 195.77 from 194.45 pounds from 190 pounds on admission, doubt accuracy of weights. -Continue Vascepa, Lipitor, Toprol-XL and diuresis as above. -With continued improvement hopefully could transition to oral Lasix in the next 1 to 2 days.  Monitor renal function and blood pressure. -Repeat chest x-ray this AM as below  -She will need an ambulatory home O2 screen prior to discharge; yesterday she did desaturate but today she did not desaturate.  Will continue diuresis for another day and if not improving will call cardiology but anticipating discharge in next 24 to 48 hours   Headache/History of migraine headaches -Patient with complaints of headache. -Compazine 5 mg IV x 1 followed by Toradol 30 mg IV x 1 given with some improvement with headache. -Placed on Toradol 15 mg IV every 6 hours as needed but will now Stop. -Supportive care.   Hypomagnesemia -Mag Level is now 2.1 -Continue to Monitor and Replete as Necessary -Repeat labs in AM.   Bipolar Disorder/Depression/Anxiety -Continue home regimen Ariprazole 2 mg po qHS, Esctialopram 10 mg po Daily, and Lorazepam 1 mg po qHS and 2 mg po Daily  -Patient noted to be on Carbamazepine 100 mg daily however per pharmacy due to drug drug interaction between Tegretol and Eliquis will taper Tegretol to every other day and patient will need to follow-up with outpatient psychiatrist for further titration and management.   Permanent A-Fib -  Continue Metoprolol Succinate 125 mg po BID for rate control. -C/w Apixaban 5 mg po BID for anticoagulation. -Continue to monitor on Telemetry   Hyperlipidemia -Continue Atorvastatin 40 mg po Daily and Icosapent Ethyl 2  grams po BID.   Hypertension -Continue Metoprolol Succinate 125 mg po BID and IV Diuresis 40 mg q12h -Continue to hold Lisinopril to allow room for diuresis. -Continue to Monitor BP per Protocol -Last BP reading was 145/80   Diabetes mellitus type 2 complicated with Diabetic Neuropathy -Hemoglobin A1c 6.8 (05/28/2022) -C/w Semglee 40 units daily. -Place on meal coverage NovoLog 8 units 3 times daily with meals. -C/w Moderate Novolog SSI AC -Continue Pregablalin 200 mg po BID.    Mild Intermittent Asthma -Stable. -Continue home regimen bronchodilators with Albuterol 2.5 mg Neb q6hprn Wheezing and SOB and Mometasone-Formoterol 200-5 mcg/act 2 puff IH BID.   Chronic back pain -Continue home regimen of Hydromorphone 2 mg po q6hprn Severe Pain.     History of OSA -Not on CPAP. -Outpatient follow-up.   CAD/Elevated Troponin -Likely secondary to early demand ischemia in the setting of volume overload. -Troponins trending down. -Patient asymptomatic with no chest pain.   -Continue with Meds as above with metoprolol, statin, diuretics. -2D echo with normal EF, no wall motion abnormalities. -Outpatient follow-up with primary cardiologist, Dr. Radford Pax.   Mild Cognitive Impairment -Being followed by neurology in the outpatient setting. -Patient has a scheduled neuropsychological evaluation 08/08/2022 for clarity of diagnosis and disease progression.   Hyponatremia -Na+ Trend: Recent Labs  Lab 06/08/22 0759 06/08/22 0851 06/09/22 0419 06/10/22 0415 06/11/22 0414 06/12/22 0423  NA 138 138 137 135 133* 134*  -Continue to Monitor and Trend and repeat CMP in the AM    AKI, improved  -BUN/Cr Trend: Recent Labs  Lab 06/08/22 0759 06/08/22 1642 06/09/22 0419 06/10/22 0415 06/11/22 0414 06/12/22 0423  BUN 14  --  19 32* 35* 42*  CREATININE 0.59   < > 0.75 1.14* 0.77 0.87   < > = values in this interval not displayed.  -Avoid Nephrotoxic Medications, Contrast Dyes, Hypotension  and Dehydration to Ensure Adequate Renal Perfusion and will need to Renally Adjust Meds -Continue to Monitor and Trend Renal Function carefully and repeat CMP in the AM    Leukocytosis -WBC Trend: Recent Labs  Lab 06/08/22 0759 06/08/22 1642 06/09/22 0800 06/10/22 0415 06/11/22 0414 06/12/22 0423  WBC 11.4* 13.5* 12.0* 10.2 10.8* 8.9  -She has a Chronic Luekoyctosis -Currently afebrile -CXR done and showed "Indistinct right infrahilar airspace opacity compatible with atelectasis or pneumonia. Mild enlargement of the cardiopericardial silhouette, without edema. Dense mitral valve calcification. Reverse right total shoulder arthroplasty. Lingular subsegmental atelectasis or scarring" -Continue to Monitor for S/Sx of Infection and will hold starting Abx -Repeat CBC in the AM    Constipation -History of bowel regimen with MiraLAX 17 g p.o. twice daily, senna docusate 1 tab p.o. twice daily, as well as bisacodyl 10 mg rectally daily as needed for moderate constipation  Hypoalbuminemia -Patient's Albumin Level is now 3.4 -Continue to Monitor and Trend -Repeat CMP in the AM    Obesity -Complicates overall prognosis and care -Estimated body mass index is 35.65 kg/m as calculated from the following:   Height as of this encounter: '5\' 2"'$  (1.575 m).   Weight as of this encounter: 88.4 kg.  -Weight Loss and Dietary Counseling given  DVT prophylaxis: SCDs Start: 06/08/22 1633 apixaban (ELIQUIS) tablet 5 mg    Code Status: Full Code Family Communication: Discussed  with husband at bedside  Disposition Plan:  Level of care: Telemetry Status is: Inpatient Remains inpatient appropriate because: She will need at least 1 more day of IV diuresis and anticipating discharging in the a.m.   Consultants:  None  Procedures:  ECHOCARDIOGRAM IMPRESSIONS     1. Left ventricular ejection fraction, by estimation, is 65 to 70%. The  left ventricle has normal function. The left ventricle has no  regional  wall motion abnormalities. There is mild left ventricular hypertrophy.  Left ventricular diastolic parameters  are indeterminate.   2. Right ventricular systolic function is normal. The right ventricular  size is normal.   3. Left atrial size was severely dilated.   4. The mitral valve is normal in structure. No evidence of mitral valve  regurgitation. No evidence of mitral stenosis.   5. The aortic valve is normal in structure. Aortic valve regurgitation is  not visualized. No aortic stenosis is present.   6. The inferior vena cava is normal in size with greater than 50%  respiratory variability, suggesting right atrial pressure of 3 mmHg.   FINDINGS   Left Ventricle: Left ventricular ejection fraction, by estimation, is 65  to 70%. The left ventricle has normal function. The left ventricle has no  regional wall motion abnormalities. The left ventricular internal cavity  size was normal in size. There is   mild left ventricular hypertrophy. Left ventricular diastolic parameters  are indeterminate.   Right Ventricle: The right ventricular size is normal. No increase in  right ventricular wall thickness. Right ventricular systolic function is  normal.   Left Atrium: Left atrial size was severely dilated.   Right Atrium: Right atrial size was normal in size.   Pericardium: There is no evidence of pericardial effusion.   Mitral Valve: The mitral valve is normal in structure. No evidence of  mitral valve regurgitation. No evidence of mitral valve stenosis. MV peak  gradient, 6.6 mmHg. The mean mitral valve gradient is 3.0 mmHg.   Tricuspid Valve: The tricuspid valve is normal in structure. Tricuspid  valve regurgitation is not demonstrated. No evidence of tricuspid  stenosis.   Aortic Valve: The aortic valve is normal in structure. Aortic valve  regurgitation is not visualized. No aortic stenosis is present.   Pulmonic Valve: The pulmonic valve was normal in  structure. Pulmonic valve  regurgitation is not visualized. No evidence of pulmonic stenosis.   Aorta: The aortic root is normal in size and structure.   Venous: The inferior vena cava is normal in size with greater than 50%  respiratory variability, suggesting right atrial pressure of 3 mmHg.   IAS/Shunts: No atrial level shunt detected by color flow Doppler.     LEFT VENTRICLE  PLAX 2D  LVIDd:         4.00 cm  LVIDs:         2.80 cm  LV PW:         1.20 cm  LV IVS:        1.30 cm     RIGHT VENTRICLE  RV S prime:     8.35 cm/s   LEFT ATRIUM            Index  LA diam:      4.90 cm  2.59 cm/m  LA Vol (A4C): 190.0 ml 100.59 ml/m   AORTIC VALVE  LVOT Vmax:   84.40 cm/s  LVOT Vmean:  45.800 cm/s  LVOT VTI:    0.109 m  AORTA  Ao Asc diam: 3.40 cm   MITRAL VALVE  MV Area (PHT): 4.49 cm     SHUNTS  MV Peak grad:  6.6 mmHg     Systemic VTI: 0.11 m  MV Mean grad:  3.0 mmHg  MV Vmax:       1.28 m/s  MV Vmean:      77.5 cm/s  MV Decel Time: 169 msec  MV E velocity: 113.00 cm/s   Antimicrobials:  Anti-infectives (From admission, onward)    Start     Dose/Rate Route Frequency Ordered Stop   06/10/22 1000  cefTRIAXone (ROCEPHIN) 2 g in sodium chloride 0.9 % 100 mL IVPB        2 g 200 mL/hr over 30 Minutes Intravenous Every 24 hours 06/10/22 0820 06/12/22 1007       Subjective: Seen and examined at bedside and she is doing a little bit better today.  Still not at her baseline yet.  Continues to be volume overloaded and diuresed well.  Desaturated yesterday but not today.  No chest pain or lightheadedness or dizziness.  No lightheadedness or dizziness.  No other concerns or complaints at this time.  Objective: Vitals:   06/11/22 0500 06/11/22 0858 06/11/22 1157 06/11/22 1931  BP:   (!) 141/88 (!) 145/80  Pulse:   74 68  Resp:    17  Temp:   98.1 F (36.7 C) 97.9 F (36.6 C)  TempSrc:   Oral Oral  SpO2:  98% 94% 96%  Weight: 88.4 kg     Height:         Intake/Output Summary (Last 24 hours) at 06/12/2022 1438 Last data filed at 06/12/2022 0600 Gross per 24 hour  Intake 480 ml  Output 400 ml  Net 80 ml   Filed Weights   06/09/22 0500 06/10/22 0457 06/11/22 0500  Weight: 88.2 kg 88.8 kg 88.4 kg   Examination: Physical Exam:  Constitutional: WN/WD obese Caucasian female currently no acute distress Respiratory: Diminished to auscultation bilaterally, no wheezing, rales, rhonchi or crackles. Normal respiratory effort and patient is not tachypenic. No accessory muscle use.  Unlabored breathing Cardiovascular: RRR, no murmurs / rubs / gallops. S1 and S2 auscultated.  Has mild 1+ lower extremity edema Abdomen: Soft, non-tender, distended secondary to body habitus. Bowel sounds positive.  GU: Deferred. Musculoskeletal: No clubbing / cyanosis of digits/nails. No joint deformity upper and lower extremities.  Skin: No rashes, lesions, ulcers on limited skin evaluation. No induration; Warm and dry.  Neurologic: CN 2-12 grossly intact with no focal deficits. Romberg sign and cerebellar reflexes not assessed.  Psychiatric: Normal judgment and insight. Alert and oriented x 3. Normal mood and appropriate affect.   Data Reviewed: I have personally reviewed following labs and imaging studies  CBC: Recent Labs  Lab 06/08/22 1642 06/09/22 0800 06/10/22 0415 06/11/22 0414 06/12/22 0423  WBC 13.5* 12.0* 10.2 10.8* 8.9  NEUTROABS  --  7.7 6.6 6.3 5.3  HGB 13.8 14.0 13.1 13.2 12.9  HCT 41.3 43.9 41.5 41.3 40.2  MCV 91.8 95.6 95.8 94.7 95.0  PLT 211 221 193 198 086   Basic Metabolic Panel: Recent Labs  Lab 06/08/22 0759 06/08/22 0851 06/08/22 1642 06/09/22 0419 06/10/22 0415 06/11/22 0414 06/12/22 0423  NA 138 138  --  137 135 133* 134*  K 4.2 4.1  --  3.9 4.4 4.6 4.8  CL 103  --   --  102 102 99 101  CO2 18*  --   --  $'27 26 27 27  't$ GLUCOSE 295*  --   --  121* 171* 277* 412*  BUN 14  --   --  19 32* 35* 42*  CREATININE 0.59  --   0.63 0.75 1.14* 0.77 0.87  CALCIUM 9.2  --   --  8.9 8.5* 8.2* 8.2*  MG  --   --  1.2* 1.6* 2.4 1.9 2.1  PHOS  --   --   --   --   --   --  3.3   GFR: Estimated Creatinine Clearance: 57.7 mL/min (by C-G formula based on SCr of 0.87 mg/dL). Liver Function Tests: Recent Labs  Lab 06/08/22 0759 06/12/22 0423  AST 19 19  ALT 8 16  ALKPHOS 94 108  BILITOT 0.5 0.3  PROT 7.0 6.6  ALBUMIN 4.0 3.4*   No results for input(s): "LIPASE", "AMYLASE" in the last 168 hours. No results for input(s): "AMMONIA" in the last 168 hours. Coagulation Profile: No results for input(s): "INR", "PROTIME" in the last 168 hours. Cardiac Enzymes: No results for input(s): "CKTOTAL", "CKMB", "CKMBINDEX", "TROPONINI" in the last 168 hours. BNP (last 3 results) No results for input(s): "PROBNP" in the last 8760 hours. HbA1C: No results for input(s): "HGBA1C" in the last 72 hours. CBG: Recent Labs  Lab 06/11/22 1153 06/11/22 1603 06/11/22 2040 06/12/22 0726 06/12/22 1215  GLUCAP 242* 244* 240* 304* 233*   Lipid Profile: No results for input(s): "CHOL", "HDL", "LDLCALC", "TRIG", "CHOLHDL", "LDLDIRECT" in the last 72 hours. Thyroid Function Tests: No results for input(s): "TSH", "T4TOTAL", "FREET4", "T3FREE", "THYROIDAB" in the last 72 hours. Anemia Panel: No results for input(s): "VITAMINB12", "FOLATE", "FERRITIN", "TIBC", "IRON", "RETICCTPCT" in the last 72 hours. Sepsis Labs: No results for input(s): "PROCALCITON", "LATICACIDVEN" in the last 168 hours.  Recent Results (from the past 240 hour(s))  Resp panel by RT-PCR (RSV, Flu A&B, Covid) Anterior Nasal Swab     Status: None   Collection Time: 06/08/22  7:59 AM   Specimen: Anterior Nasal Swab  Result Value Ref Range Status   SARS Coronavirus 2 by RT PCR NEGATIVE NEGATIVE Final    Comment: (NOTE) SARS-CoV-2 target nucleic acids are NOT DETECTED.  The SARS-CoV-2 RNA is generally detectable in upper respiratory specimens during the acute phase of  infection. The lowest concentration of SARS-CoV-2 viral copies this assay can detect is 138 copies/mL. A negative result does not preclude SARS-Cov-2 infection and should not be used as the sole basis for treatment or other patient management decisions. A negative result may occur with  improper specimen collection/handling, submission of specimen other than nasopharyngeal swab, presence of viral mutation(s) within the areas targeted by this assay, and inadequate number of viral copies(<138 copies/mL). A negative result must be combined with clinical observations, patient history, and epidemiological information. The expected result is Negative.  Fact Sheet for Patients:  EntrepreneurPulse.com.au  Fact Sheet for Healthcare Providers:  IncredibleEmployment.be  This test is no t yet approved or cleared by the [place] FDA and  has been authorized for detection and/or diagnosis of SARS-CoV-2 by FDA under an Emergency Use Authorization (EUA). This EUA will remain  in effect (meaning this test can be used) for the duration of the COVID-19 declaration under Section 564(b)(1) of the Act, 21 U.S.C.section 360bbb-3(b)(1), unless the authorization is terminated  or revoked sooner.       Influenza A by PCR NEGATIVE NEGATIVE Final   Influenza B by PCR NEGATIVE NEGATIVE Final    Comment: (NOTE) The Xpert  Xpress SARS-CoV-2/FLU/RSV plus assay is intended as an aid in the diagnosis of influenza from Nasopharyngeal swab specimens and should not be used as a sole basis for treatment. Nasal washings and aspirates are unacceptable for Xpert Xpress SARS-CoV-2/FLU/RSV testing.  Fact Sheet for Patients: EntrepreneurPulse.com.au  Fact Sheet for Healthcare Providers: IncredibleEmployment.be  This test is not yet approved or cleared by the [place] FDA and has been authorized for detection and/or diagnosis of SARS-CoV-2  by FDA under an Emergency Use Authorization (EUA). This EUA will remain in effect (meaning this test can be used) for the duration of the COVID-19 declaration under Section 564(b)(1) of the Act, 21 U.S.C. section 360bbb-3(b)(1), unless the authorization is terminated or revoked.     Resp Syncytial Virus by PCR NEGATIVE NEGATIVE Final    Comment: (NOTE) Fact Sheet for Patients: EntrepreneurPulse.com.au  Fact Sheet for Healthcare Providers: IncredibleEmployment.be  This test is not yet approved or cleared by the [place] FDA and has been authorized for detection and/or diagnosis of SARS-CoV-2 by FDA under an Emergency Use Authorization (EUA). This EUA will remain in effect (meaning this test can be used) for the duration of the COVID-19 declaration under Section 564(b)(1) of the Act, 21 U.S.C. section 360bbb-3(b)(1), unless the authorization is terminated or revoked.  Performed at [facility], [address] [place], [place] [address]   Urine Culture     Status: None   Collection Time: 06/09/22  9:11 AM   Specimen: Urine, Catheterized  Result Value Ref Range Status   Specimen Description   Final    URINE, CATHETERIZED Performed at [facility] [address], [place], [place] [address]    Special Requests   Final    NONE Performed at [facility], [place] [address] [place], [place] [address]    Culture   Final    NO GROWTH Performed at [facility], [place] [address], [place], [place] [address]    Report Status 06/10/2022 FINAL  Final    Radiology Studies: DG CHEST PORT 1 VIEW  Result Date: 06/12/2022 CLINICAL DATA:  Shortness of breath EXAM: PORTABLE CHEST 1 VIEW COMPARISON:  06/11/2022 FINDINGS: Indistinct right infrahilar airspace opacity compatible with atelectasis or pneumonia. Dense mitral valve calcification. Lingular subsegmental atelectasis or scarring. Low lung  volumes are present, causing crowding of the pulmonary vasculature. Mild enlargement of the cardiopericardial silhouette. Reverse right total shoulder arthroplasty. IMPRESSION: 1. Indistinct right infrahilar airspace opacity compatible with atelectasis or pneumonia. 2. Mild enlargement of the cardiopericardial silhouette, without edema. 3. Dense mitral valve calcification. 4. Reverse right total shoulder arthroplasty. 5. Lingular subsegmental atelectasis or scarring. Electronically Signed   By: Van Clines M.D.   On: 06/12/2022 07:33   DG CHEST PORT 1 VIEW  Result Date: 06/11/2022 CLINICAL DATA:  Shortness of breath. History of congestive heart failure. EXAM: PORTABLE CHEST 1 VIEW COMPARISON:  06/08/2022 FINDINGS: Cardiomegaly as seen chronically. Aortic atherosclerosis. The lungs are clear. No edema or effusion. IMPRESSION: Chronic cardiomegaly. Aortic atherosclerosis. No acute finding. Electronically Signed   By: Nelson Chimes M.D.   On: 06/11/2022 11:08    Scheduled Meds:  apixaban  5 mg Oral BID   ARIPiprazole  2 mg Oral QHS   atorvastatin  40 mg Oral Daily   carbamazepine  100 mg Oral QODAY   Chlorhexidine Gluconate Cloth  6 each Topical Daily   escitalopram  10 mg Oral Daily   furosemide  60 mg Intravenous Q12H   icosapent  Ethyl  2 g Oral BID   insulin aspart  0-15 Units Subcutaneous TID WC   insulin aspart  8 Units Subcutaneous TID WC   insulin glargine-yfgn  40 Units Subcutaneous Daily   LORazepam  1 mg Oral Once   LORazepam  1 mg Oral QHS   LORazepam  2 mg Oral Daily   metoprolol succinate  125 mg Oral BID   mometasone-formoterol  2 puff Inhalation BID   polyethylene glycol  17 g Oral BID   potassium chloride SA  20 mEq Oral Daily   pregabalin  200 mg Oral BID   senna-docusate  1 tablet Oral BID   sodium chloride flush  3 mL Intravenous Q12H   Continuous Infusions:  sodium chloride      LOS: 4 days   Raiford Noble, DO [facility] Available via Epic secure chat  7am-7pm After these hours, please refer to coverage provider listed on [email or web address] 06/12/2022, 2:38 PM

## 2022-06-12 NOTE — Progress Notes (Signed)
Pharmacy: Drug Interaction Intervention  D/w Dr Norma Fredrickson - drug interaction w/carbamazepine & apixaban   Plan: Dc carbamazepine Start trileptal 150 mg po bid for bipolar d/o  Eudelia Bunch, Pharm.D Use secure chat for questions 06/12/2022 4:52 PM

## 2022-06-13 ENCOUNTER — Inpatient Hospital Stay (HOSPITAL_COMMUNITY): Payer: Medicare Other

## 2022-06-13 DIAGNOSIS — F3112 Bipolar disorder, current episode manic without psychotic features, moderate: Secondary | ICD-10-CM | POA: Diagnosis not present

## 2022-06-13 DIAGNOSIS — I5033 Acute on chronic diastolic (congestive) heart failure: Secondary | ICD-10-CM | POA: Diagnosis not present

## 2022-06-13 DIAGNOSIS — J9601 Acute respiratory failure with hypoxia: Secondary | ICD-10-CM | POA: Diagnosis not present

## 2022-06-13 DIAGNOSIS — Z7901 Long term (current) use of anticoagulants: Secondary | ICD-10-CM | POA: Diagnosis not present

## 2022-06-13 DIAGNOSIS — N390 Urinary tract infection, site not specified: Secondary | ICD-10-CM

## 2022-06-13 LAB — COMPREHENSIVE METABOLIC PANEL
ALT: 20 U/L (ref 0–44)
AST: 27 U/L (ref 15–41)
Albumin: 3.3 g/dL — ABNORMAL LOW (ref 3.5–5.0)
Alkaline Phosphatase: 103 U/L (ref 38–126)
Anion gap: 7 (ref 5–15)
BUN: 41 mg/dL — ABNORMAL HIGH (ref 8–23)
CO2: 28 mmol/L (ref 22–32)
Calcium: 8.4 mg/dL — ABNORMAL LOW (ref 8.9–10.3)
Chloride: 99 mmol/L (ref 98–111)
Creatinine, Ser: 0.85 mg/dL (ref 0.44–1.00)
GFR, Estimated: >60 mL/min (ref >60)
Glucose, Bld: 445 mg/dL — ABNORMAL HIGH (ref 70–99)
Potassium: 4.5 mmol/L (ref 3.5–5.1)
Sodium: 134 mmol/L — ABNORMAL LOW (ref 135–145)
Total Bilirubin: 0.3 mg/dL (ref 0.3–1.2)
Total Protein: 6.2 g/dL — ABNORMAL LOW (ref 6.5–8.1)

## 2022-06-13 LAB — MAGNESIUM: Magnesium: 2.1 mg/dL (ref 1.7–2.4)

## 2022-06-13 LAB — PHOSPHORUS: Phosphorus: 3 mg/dL (ref 2.5–4.6)

## 2022-06-13 LAB — GLUCOSE, CAPILLARY: Glucose-Capillary: 292 mg/dL — ABNORMAL HIGH (ref 70–99)

## 2022-06-13 MED ORDER — GUAIFENESIN ER 600 MG PO TB12
1200.0000 mg | ORAL_TABLET | Freq: Two times a day (BID) | ORAL | Status: DC
  Administered 2022-06-13: 1200 mg via ORAL
  Filled 2022-06-13: qty 2

## 2022-06-13 MED ORDER — SENNOSIDES-DOCUSATE SODIUM 8.6-50 MG PO TABS: 1.0000 | ORAL_TABLET | Freq: Every day | ORAL | 0 refills | Status: DC

## 2022-06-13 MED ORDER — DOXYCYCLINE HYCLATE 100 MG PO TABS
100.0000 mg | ORAL_TABLET | Freq: Two times a day (BID) | ORAL | Status: DC
  Administered 2022-06-13: 100 mg via ORAL
  Filled 2022-06-13: qty 1

## 2022-06-13 MED ORDER — DOXYCYCLINE HYCLATE 100 MG PO TABS: 100.0000 mg | ORAL_TABLET | Freq: Two times a day (BID) | ORAL | 0 refills | Status: AC

## 2022-06-13 MED ORDER — INSULIN ASPART 100 UNIT/ML IJ SOLN: 12.0000 [IU] | Freq: Three times a day (TID) | INTRAMUSCULAR | Status: DC

## 2022-06-13 MED ORDER — OXCARBAZEPINE 150 MG PO TABS: 150.0000 mg | ORAL_TABLET | Freq: Two times a day (BID) | ORAL | 0 refills | Status: AC

## 2022-06-13 MED ORDER — INSULIN GLARGINE-YFGN 100 UNIT/ML ~~LOC~~ SOLN
55.0000 [IU] | Freq: Every day | SUBCUTANEOUS | Status: DC
  Administered 2022-06-13: 55 [IU] via SUBCUTANEOUS
  Filled 2022-06-13: qty 0.55

## 2022-06-13 MED ORDER — FUROSEMIDE 40 MG PO TABS: 40.0000 mg | ORAL_TABLET | Freq: Two times a day (BID) | ORAL | 3 refills | Status: DC

## 2022-06-13 MED ORDER — GUAIFENESIN ER 600 MG PO TB12: 600.0000 mg | ORAL_TABLET | Freq: Two times a day (BID) | ORAL | 0 refills | Status: AC

## 2022-06-13 NOTE — TOC Progression Note (Signed)
Transition of Care [facility]) - Progression Note    Patient Details  Name: Amber Stephenson MRN: [id number] Date of Birth: [birth year]  [facility]) CM/SW [place], LCSW Phone Number: 06/13/2022, 10:11 AM  Clinical Narrative:    CSW spoke with pt regarding home O2. Pt does not have a preferences in agency. Referrals sent out to [facility].O2 will be delivered to pt's room prior to d/c. No additional needs.    Expected Discharge Plan: [place] Barriers to Discharge: Continued Medical Work up  Expected Discharge Plan and Services   Discharge Planning Services: CM Consult   Living arrangements for the past 2 months: Single Family Home                           HH Arranged: PT, OT   Date HH Agency Contacted: 06/12/22 Time [place]: 0712 Representative spoke with at [place]: corey   Social Determinants of Health ([place]) Interventions SDOH Screenings   Food Insecurity: No Food Insecurity (06/08/2022)  Housing: Low Risk  (06/08/2022)  Transportation Needs: No Transportation Needs (06/08/2022)  Utilities: Not At Risk (06/08/2022)  Alcohol Screen: Low Risk  (12/13/2021)  Depression (PHQ2-9): High Risk (04/17/2022)  Financial Resource Strain: Low Risk  (12/13/2021)  Physical Activity: Inactive (12/13/2021)  Social Connections: Socially Integrated (12/13/2021)  Stress: No Stress Concern Present (12/13/2021)  Tobacco Use: Low Risk  (06/08/2022)    Readmission Risk Interventions     No data to display

## 2022-06-13 NOTE — Care Management Important Message (Signed)
Important Message  Patient Details  Name: Amber Stephenson MRN: [id number] Date of Birth: Apr 30, 1946   Medicare Important Message Given:  Yes     Memory Argue 06/13/2022, 1:12 PM

## 2022-06-13 NOTE — Discharge Summary (Signed)
Physician Discharge Summary   Patient: Amber Stephenson MRN: [id number] DOB: 1945/07/02  Admit date:     06/08/2022  Discharge date: 06/16/22  Discharge Physician: Raiford Noble, DO   PCP: Florian Buff, MD   Recommendations at discharge:   Follow-up with PCP within 1 to 2 weeks and repeat CBC, CMP, mag, Phos within 1 week Follow-up with cardiology Dr. Radford Pax in outpatient setting within 1 to 2 weeks Repeat chest x-ray in 3 to 6 weeks  Discharge Diagnoses: Principal Problem:   Acute on chronic diastolic (congestive) heart failure (HCC) Active Problems:   Long term current use of anticoagulant   Chronic atrial fibrillation (HCC)   Restless legs   Essential hypertension   Depression, recurrent (HCC)   Diabetic neuropathy associated with type 2 diabetes mellitus (HCC)   Migraine headache with aura   Morbid obesity with alveolar hypoventilation (HCC)   Bipolar 1 disorder, manic, moderate (HCC)   Mixed hyperlipidemia   CAD (coronary artery disease)   UTI (urinary tract infection)   Acute respiratory failure with hypoxemia (HCC)  Resolved Problems:   * No resolved hospital problems. *  Hospital Course: Patient is a 77 year old obese Caucasian female with a past medical history significant for but not limited to hypertension, diabetes mellitus type 2, complicated by diabetic neuropathy, hyperlipidemia, chronic atrial fibrillation on anticoagulation with Eliquis, bipolar disorder, depression nonobstructive CAD, chronic diastolic CHF, migraine headaches, history of Nash, chronic back pain, history of restless leg syndrome as well as other comorbidities who presents with worsening shortness of breath for last 2 days. Of note she had been noncompliant with her diuretics and has had some dietary indiscretion. She was admitted for acute respiratory failure with hypoxia in the setting of acute on chronic diastolic CHF exacerbation and had been placed on IV Lasix and currently diuresing well. She  had a Foley catheter placed in the ED which will now remove and have PT and OT to further evaluate. PT and OT recommending home health. She will need an Ambulatory home O2 screen prior to discharge.  She desaturated yesterday on the ambulatory home O2 screen but today she did not.  Repeat amatory home O2 screen showed the patient did desaturate slightly and she appeared euvolemic after aggressive diuresis.  Will discharge her on p.o. Lasix 40 mg twice daily and have her follow-up and send her out on oxygen.  She will need to see cardiology within 1 to 2 weeks as she is medically stable for discharge at this time.   Assessment and Plan:  Acute Hypoxemic Respiratory Failure likely secondary to acute on Chronic Diastolic CHF -Secondary to medication noncompliance, dietary indiscretions. -Patient on admission with O2 requirements of 2 L nasal cannula. -Patient did state was taking the Lasix every other day to avoid frequent urination as she had stopped to do during the day.  Patient also endorses dietary indiscretions. -Improving clinically. -SpO2: 96 % O2 Flow Rate (L/min): 2 L/min -High-sensitivity troponin noted at 56 trending down to 30 points subsequently to 29.  Patient denies any crushing chest pain. -2D echo done with normal EF,NWMA, dilated left atrium. -Patient is diuresing on Lasix 40 mg IV every 12 hours and increase to 60 mg q12h; Will Discontinue Foley Catheter tomorrow -Strict I's and O's and Daily Weights; Patient is -2.564 Liters  -Current weight of 194.89 pounds from 195.77 from 194.45 pounds from 190 pounds on admission, doubt accuracy of weights. -Continue Vascepa, Lipitor, Toprol-XL and diuresis as above. -With continued improvement hopefully  could transition to oral Lasix in the next 1 to 2 days.  Monitor renal function and blood pressure. -Repeat chest x-ray this AM as below  -She will need an ambulatory home O2 screen prior to discharge; yesterday she did desaturate but today  she did not desaturate. -She is diuresed and did desaturate at the day of discharge so she will be sent home on 2 L oxygen.  She is medically stable for discharge at this time and will need to follow-up with PCP as well as cardiology and she will go home on p.o. Lasix 40 mg twice daily   Headache/History of migraine headaches -Patient with complaints of headache. -Compazine 5 mg IV x 1 followed by Toradol 30 mg IV x 1 given with some improvement with headache. -Placed on Toradol 15 mg IV every 6 hours as needed but will now Stop. -Supportive care.   Hypomagnesemia -Mag Level Trend: Recent Labs  Lab 06/08/22 1642 06/09/22 0419 06/10/22 0415 06/11/22 0414 06/12/22 0423 06/13/22 0407  MG 1.2* 1.6* 2.4 1.9 2.1 2.1  -Continue to Monitor and Replete as Necessary -Repeat labs in AM.   Bipolar Disorder/Depression/Anxiety -Continue home regimen Ariprazole 2 mg po qHS, Esctialopram 10 mg po Daily, and Lorazepam 1 mg po qHS and 2 mg po Daily  -Patient noted to be on Carbamazepine 100 mg daily however per pharmacy due to drug drug interaction between Tegretol and Eliquis will taper Tegretol to every other day and patient will need to follow-up with outpatient psychiatrist for further titration and management.   Permanent A-Fib -Continue Metoprolol Succinate 125 mg po BID for rate control. -C/w Apixaban 5 mg po BID for anticoagulation. -Continue to monitor on Telemetry   Hyperlipidemia -Continue Atorvastatin 40 mg po Daily and Icosapent Ethyl 2 grams po BID.   Hypertension -Continue Metoprolol Succinate 125 mg po BID and IV Diuresis 40 mg q12h -Continue to hold Lisinopril to allow room for diuresis. -Continue to Monitor BP per Protocol -Last BP reading was 145/80   Diabetes mellitus type 2 complicated with Diabetic Neuropathy -Hemoglobin A1c 6.8 (05/28/2022) -C/w Semglee 40 units daily. -Place on meal coverage NovoLog 8 units 3 times daily with meals. -C/w Moderate Novolog SSI  AC -Continue Pregablalin 200 mg po BID.    Mild Intermittent Asthma -Stable. -Continue home regimen bronchodilators with Albuterol 2.5 mg Neb q6hprn Wheezing and SOB and Mometasone-Formoterol 200-5 mcg/act 2 puff IH BID.   Chronic back pain -Continue home regimen of Hydromorphone 2 mg po q6hprn Severe Pain.     History of OSA -Not on CPAP. -Outpatient follow-up.   CAD/Elevated Troponin -Likely secondary to early demand ischemia in the setting of volume overload. -Troponins trending down. -Patient asymptomatic with no chest pain.   -Continue with Meds as above with metoprolol, statin, diuretics. -2D echo with normal EF, no wall motion abnormalities. -Outpatient follow-up with primary cardiologist, Dr. Radford Pax.   Mild Cognitive Impairment -Being followed by neurology in the outpatient setting. -Patient has a scheduled neuropsychological evaluation 08/08/2022 for clarity of diagnosis and disease progression.   Hyponatremia -Na+ Trend: Recent Labs  Lab 06/08/22 0759 06/08/22 0851 06/09/22 0419 06/10/22 0415 06/11/22 0414 06/12/22 0423 06/13/22 0407  NA 138 138 137 135 133* 134* 134*  -Continue to Monitor and Trend and repeat CMP in the AM    AKI, improved  -BUN/Cr Trend: Recent Labs  Lab 06/08/22 0759 06/08/22 1642 06/09/22 0419 06/10/22 0415 06/11/22 0414 06/12/22 0423 06/13/22 0407  BUN 14  --  19 32* 35*  42* 41*  CREATININE 0.59   < > 0.75 1.14* 0.77 0.87 0.85   < > = values in this interval not displayed.  -Avoid Nephrotoxic Medications, Contrast Dyes, Hypotension and Dehydration to Ensure Adequate Renal Perfusion and will need to Renally Adjust Meds -Continue to Monitor and Trend Renal Function carefully and repeat CMP within 1 week   Leukocytosis -WBC Trend: Recent Labs  Lab 06/08/22 0759 06/08/22 1642 06/09/22 0800 06/10/22 0415 06/11/22 0414 06/12/22 0423  WBC 11.4* 13.5* 12.0* 10.2 10.8* 8.9  -She has a Chronic Luekoyctosis -Currently  afebrile -CXR done and showed "Indistinct right infrahilar airspace opacity compatible with atelectasis or pneumonia. Mild enlargement of the cardiopericardial silhouette, without edema. Dense mitral valve calcification. Reverse right total shoulder arthroplasty. Lingular subsegmental atelectasis or scarring" -Continue to Monitor for S/Sx of Infection and will hold starting Abx -Repeat CBC within 1 week   Constipation -History of bowel regimen with MiraLAX 17 g p.o. twice daily, senna docusate 1 tab p.o. twice daily, as well as bisacodyl 10 mg rectally daily as needed for moderate constipation   Hypoalbuminemia -Patient's Albumin Level Trend: Recent Labs  Lab 06/08/22 0759 06/12/22 0423 06/13/22 0407  ALBUMIN 4.0 3.4* 3.3*  -Continue to Monitor and Trend -Repeat CMP in the AM    Obesity -Complicates overall prognosis and care -Estimated body mass index is 36.13 kg/m as calculated from the following:   Height as of this encounter: '5\' 2"'$  (1.575 m).   Weight as of this encounter: 89.6 kg.  -Weight Loss and Dietary Counseling given    Consultants: None Procedures performed: None  Disposition: Home health Diet recommendation:  Cardiac and Carb modified diet DISCHARGE MEDICATION: Allergies as of 06/13/2022       Reactions   Iodine Anaphylaxis   Ivp Dye [iodinated Contrast Media] Anaphylaxis, Swelling   Throat closes   Latex Other (See Comments)   Latex tape pulls skin with it   Tizanidine Other (See Comments)   Weakness, goofy, bad dreams        Medication List     STOP taking these medications    alendronate 70 MG tablet Commonly known as: FOSAMAX   carbamazepine 100 MG 12 hr capsule Commonly known as: CARBATROL   metaxalone 800 MG tablet Commonly known as: SKELAXIN   NON FORMULARY   telmisartan 20 MG tablet Commonly known as: MICARDIS   tirzepatide 5 MG/0.5ML Pen Commonly known as: MOUNJARO       TAKE these medications    albuterol (2.5 MG/3ML)  0.083% nebulizer solution Commonly known as: PROVENTIL Take 3 mLs (2.5 mg total) by nebulization every 6 (six) hours as needed for wheezing or shortness of breath. What changed: when to take this   albuterol 108 (90 Base) MCG/ACT inhaler Commonly known as: VENTOLIN HFA Inhale 2 puffs into the lungs every 6 (six) hours as needed for wheezing or shortness of breath. What changed: when to take this   apixaban 5 MG Tabs tablet Commonly known as: Eliquis TAKE 1 TABLET(5 MG) BY MOUTH TWICE DAILY What changed:  how much to take how to take this when to take this additional instructions   ARIPiprazole 2 MG tablet Commonly known as: ABILIFY Take 2 mg by mouth at bedtime.   atorvastatin 40 MG tablet Commonly known as: LIPITOR TAKE 1 TABLET(40 MG) BY MOUTH DAILY What changed:  how much to take how to take this when to take this additional instructions   benztropine 1 MG tablet Commonly known as: COGENTIN Take  1 tablet (1 mg total) by mouth 2 (two) times daily.   Dexcom G6 Transmitter Misc Use as instructed to check blood sugar. Change every 90 days   Dexcom G7 Sensor Misc Change sensor every 10 days   doxycycline 100 MG tablet Commonly known as: VIBRA-TABS Take 1 tablet (100 mg total) by mouth every 12 (twelve) hours for 5 days.   Emgality 120 MG/ML Soaj Generic drug: Galcanezumab-gnlm One injection per month What changed:  how much to take how to take this when to take this additional instructions   escitalopram 10 MG tablet Commonly known as: LEXAPRO Take 10 mg by mouth daily.   Fluticasone-Salmeterol 500-50 MCG/DOSE Aepb Commonly known as: Advair Diskus Inhale 1 puff into the lungs in the morning and at bedtime. What changed:  when to take this reasons to take this   furosemide 40 MG tablet Commonly known as: LASIX Take 1 tablet (40 mg total) by mouth 2 (two) times daily. What changed: when to take this   guaiFENesin 600 MG 12 hr tablet Commonly known  as: MUCINEX Take 1 tablet (600 mg total) by mouth 2 (two) times daily for 5 days.   hydrOXYzine 25 MG capsule Commonly known as: VISTARIL Take 1 capsule (25 mg total) by mouth every 8 (eight) hours as needed.   icosapent Ethyl 1 g capsule Commonly known as: VASCEPA TAKE 2 CAPSULES TWICE A DAY WITH MEALS What changed: See the new instructions.   insulin lispro 100 UNIT/ML KwikPen Commonly known as: HumaLOG KwikPen Max daily 50 units What changed:  how much to take how to take this when to take this additional instructions   Insulin Pen Needle 29G X 12MM Misc 1 Device by Does not apply route daily in the afternoon.   Lantus SoloStar 100 UNIT/ML Solostar Pen Generic drug: insulin glargine Inject 68 Units into the skin daily.   lisinopril 10 MG tablet Commonly known as: ZESTRIL Take 1 tablet (10 mg total) by mouth daily.   LORazepam 1 MG tablet Commonly known as: ATIVAN Take 1 tablet (1 mg total) by mouth 2 (two) times daily. And give 2 tablets by mouth at bedtime What changed:  how much to take additional instructions   metFORMIN 500 MG tablet Commonly known as: GLUCOPHAGE Take 1 tablet (500 mg total) by mouth 2 (two) times daily with a meal. TAKE 1 TABLET(500 MG) BY MOUTH TWICE DAILY AFTER A MEAL What changed: additional instructions   metoprolol succinate 25 MG 24 hr tablet Commonly known as: Toprol XL Take 1 tablet (25 mg total) by mouth in the morning and at bedtime. What changed: additional instructions   metoprolol succinate 100 MG 24 hr tablet Commonly known as: TOPROL-XL TAKE 1 TABLET IN THE MORNING AND AT BEDTIME What changed:  how much to take how to take this when to take this additional instructions   naloxone 4 MG/0.1ML Liqd nasal spray kit Commonly known as: NARCAN SMARTSIG:Both Nares   nystatin ointment Commonly known as: MYCOSTATIN Apply 1 Application topically 2 (two) times daily. What changed:  when to take this reasons to take this    ondansetron 8 MG disintegrating tablet Commonly known as: ZOFRAN-ODT Take 1 tablet (8 mg total) by mouth every 6 (six) hours as needed for nausea or vomiting.   OXcarbazepine 150 MG tablet Commonly known as: TRILEPTAL Take 1 tablet (150 mg total) by mouth 2 (two) times daily.   polyethylene glycol powder 17 GM/SCOOP powder Commonly known as: GLYCOLAX/MIRALAX Take 17 g by  mouth as needed for mild constipation.   potassium chloride SA 20 MEQ tablet Commonly known as: KLOR-CON M Take 1 tablet (20 mEq total) by mouth daily. What changed:  how much to take when to take this   pregabalin 200 MG capsule Commonly known as: LYRICA TAKE 1 CAPSULE(200 MG) BY MOUTH TWICE DAILY What changed: See the new instructions.   Prolia 60 MG/ML Sosy injection Generic drug: denosumab Inject 60 mg into the skin every 6 (six) months.   senna-docusate 8.6-50 MG tablet Commonly known as: Senokot-S Take 1 tablet by mouth at bedtime.   TYLENOL PO Take 2 tablets by mouth as needed (pain/headache).   Vitamin D (Cholecalciferol) 25 MCG (1000 UT) Tabs Take 1,000 Units by mouth daily in the afternoon.        Follow-up Information     [facility]. Go on 06/23/2022.   Specialty: Cardiology Why: Hospital follow up 06/23/22 @ 3 pm PLEASE bring a current medication list to appointment FREE valet parking, Entrance C, off [employer] information: [address] [id number] [place], [facility] Follow up.   Specialty: Home Health Services Why: Will follow up with you 24 to 48hrs after discharge. Contact information: [address] [place] [address] [phone]         Emmaline Life, NP Follow up on 07/04/2022.   Specialty: Cardiology Why: 11:20AM. Cardiology follow up with Dr. Theodosia Blender nurse practitioner Contact information: [place]  [place] [address] [phone]                Discharge Exam: Danley Danker Weights   06/10/22 0457 06/11/22 0500 06/13/22 0357  Weight: 88.8 kg 88.4 kg 89.6 kg   Vitals:   06/13/22 0803 06/13/22 1234  BP:  114/80  Pulse:  67  Resp: 16 20  Temp:  (!) 97.5 F (36.4 C)  SpO2: 98% 92%   Examination: Physical Exam:  Constitutional: WN/WD obese Caucasian female currently no acute distress Respiratory: Diminished to auscultation bilaterally, no wheezing, rales, rhonchi or crackles. Normal respiratory effort and patient is not tachypenic. No accessory muscle use.  Unlabored breathing Cardiovascular: RRR, no murmurs / rubs / gallops. S1 and S2 auscultated. No extremity edema.  Abdomen: Soft, non-tender, distended secondary to body habitus. Bowel sounds positive.  GU: Deferred. Musculoskeletal: No clubbing / cyanosis of digits/nails. No joint deformity upper and lower extremities.   Skin: No rashes, lesions, ulcers on limited skin evaluation. No induration; Warm and dry.  Neurologic: CN 2-12 grossly intact with no focal deficits. Romberg sign and cerebellar reflexes not assessed.  Psychiatric: Normal judgment and insight. Alert and oriented x 3. Normal mood and appropriate affect.   Condition at discharge: stable  The results of significant diagnostics from this hospitalization (including imaging, microbiology, ancillary and laboratory) are listed below for reference.   Imaging Studies: DG CHEST PORT 1 VIEW  Result Date: 06/13/2022 CLINICAL DATA:  Shortness of breath EXAM: PORTABLE CHEST 1 VIEW COMPARISON:  CXR 06/12/22 FINDINGS: No pleural effusion. No pneumothorax. Low lung volumes. Unchanged cardiac and mediastinal contours. Prominent bilateral interstitial opacities with a more focal airspace opacity in the right infrahilar region, which may be secondary to bronchovascular crowding in the setting of low lung volumes or represent changes related to the presence of an underlying  atypical infection. Right shoulder arthroplasty. Visualized upper abdomen is unremarkable. IMPRESSION:  Prominent bilateral interstitial opacities with a more focal airspace opacity in the right infrahilar region, which may be secondary to bronchovascular crowding in the setting of low lung volumes or represent changes related to the presence of an underlying infection. Electronically Signed   By: Marin Roberts M.D.   On: 06/13/2022 08:21   DG CHEST PORT 1 VIEW  Result Date: 06/12/2022 CLINICAL DATA:  Shortness of breath EXAM: PORTABLE CHEST 1 VIEW COMPARISON:  06/11/2022 FINDINGS: Indistinct right infrahilar airspace opacity compatible with atelectasis or pneumonia. Dense mitral valve calcification. Lingular subsegmental atelectasis or scarring. Low lung volumes are present, causing crowding of the pulmonary vasculature. Mild enlargement of the cardiopericardial silhouette. Reverse right total shoulder arthroplasty. IMPRESSION: 1. Indistinct right infrahilar airspace opacity compatible with atelectasis or pneumonia. 2. Mild enlargement of the cardiopericardial silhouette, without edema. 3. Dense mitral valve calcification. 4. Reverse right total shoulder arthroplasty. 5. Lingular subsegmental atelectasis or scarring. Electronically Signed   By: Van Clines M.D.   On: 06/12/2022 07:33   DG CHEST PORT 1 VIEW  Result Date: 06/11/2022 CLINICAL DATA:  Shortness of breath. History of congestive heart failure. EXAM: PORTABLE CHEST 1 VIEW COMPARISON:  06/08/2022 FINDINGS: Cardiomegaly as seen chronically. Aortic atherosclerosis. The lungs are clear. No edema or effusion. IMPRESSION: Chronic cardiomegaly. Aortic atherosclerosis. No acute finding. Electronically Signed   By: Nelson Chimes M.D.   On: 06/11/2022 11:08   ECHOCARDIOGRAM COMPLETE  Result Date: 06/09/2022    ECHOCARDIOGRAM REPORT   Patient Name:   Amber Stephenson Date of Exam: 06/09/2022 Medical Rec #:  [id number]         Height:       62.0 in  Accession #:    [id number]        Weight:       194.4 lb Date of Birth:  14-Sep-1945          BSA:          1.889 m Patient Age:    77 years          BP:           106/75 mmHg Patient Gender: F                 HR:           105 bpm. Exam Location:  Inpatient Procedure: 2D Echo, Cardiac Doppler and Color Doppler Indications:    CHF  History:        Patient has prior history of Echocardiogram examinations. CAD,                 Arrythmias:Atrial Fibrillation; Risk Factors:Hypertension,                 Dyslipidemia and Diabetes.  Sonographer:    Danne Baxter RDCS, FE, PE Referring Phys: [phone] [place]  Sonographer Comments: Technically difficult study due to poor echo windows and suboptimal parasternal window. IMPRESSIONS  1. Left ventricular ejection fraction, by estimation, is 65 to 70%. The left ventricle has normal function. The left ventricle has no regional wall motion abnormalities. There is mild left ventricular hypertrophy. Left ventricular diastolic parameters are indeterminate.  2. Right ventricular systolic function is normal. The right ventricular size is normal.  3. Left atrial size was severely dilated.  4. The mitral valve is normal in structure. No evidence of mitral valve regurgitation. No evidence of mitral stenosis.  5. The aortic valve is normal in structure. Aortic valve regurgitation is not visualized. No  aortic stenosis is present.  6. The inferior vena cava is normal in size with greater than 50% respiratory variability, suggesting right atrial pressure of 3 mmHg. FINDINGS  Left Ventricle: Left ventricular ejection fraction, by estimation, is 65 to 70%. The left ventricle has normal function. The left ventricle has no regional wall motion abnormalities. The left ventricular internal cavity size was normal in size. There is  mild left ventricular hypertrophy. Left ventricular diastolic parameters are indeterminate. Right Ventricle: The right ventricular size is normal. No increase in  right ventricular wall thickness. Right ventricular systolic function is normal. Left Atrium: Left atrial size was severely dilated. Right Atrium: Right atrial size was normal in size. Pericardium: There is no evidence of pericardial effusion. Mitral Valve: The mitral valve is normal in structure. No evidence of mitral valve regurgitation. No evidence of mitral valve stenosis. MV peak gradient, 6.6 mmHg. The mean mitral valve gradient is 3.0 mmHg. Tricuspid Valve: The tricuspid valve is normal in structure. Tricuspid valve regurgitation is not demonstrated. No evidence of tricuspid stenosis. Aortic Valve: The aortic valve is normal in structure. Aortic valve regurgitation is not visualized. No aortic stenosis is present. Pulmonic Valve: The pulmonic valve was normal in structure. Pulmonic valve regurgitation is not visualized. No evidence of pulmonic stenosis. Aorta: The aortic root is normal in size and structure. Venous: The inferior vena cava is normal in size with greater than 50% respiratory variability, suggesting right atrial pressure of 3 mmHg. IAS/Shunts: No atrial level shunt detected by color flow Doppler.  LEFT VENTRICLE PLAX 2D LVIDd:         4.00 cm LVIDs:         2.80 cm LV PW:         1.20 cm LV IVS:        1.30 cm  RIGHT VENTRICLE RV S prime:     8.35 cm/s LEFT ATRIUM            Index LA diam:      4.90 cm  2.59 cm/m LA Vol (A4C): 190.0 ml 100.59 ml/m  AORTIC VALVE LVOT Vmax:   84.40 cm/s LVOT Vmean:  45.800 cm/s LVOT VTI:    0.109 m  AORTA Ao Asc diam: 3.40 cm MITRAL VALVE MV Area (PHT): 4.49 cm     SHUNTS MV Peak grad:  6.6 mmHg     Systemic VTI: 0.11 m MV Mean grad:  3.0 mmHg MV Vmax:       1.28 m/s MV Vmean:      77.5 cm/s MV Decel Time: 169 msec MV E velocity: 113.00 cm/s Candee Furbish MD Electronically signed by Candee Furbish MD Signature Date/Time: 06/09/2022/11:33:43 AM    Final    DG Chest Port 1 View  Result Date: 06/08/2022 CLINICAL DATA:  Shortness of breath EXAM: PORTABLE CHEST 1 VIEW  COMPARISON:  November 11, 2021 FINDINGS: Stable cardiomegaly. The hila and mediastinum are stable. No pneumothorax. No suspicious nodules, masses, or focal infiltrates. Mild atelectasis in the right base. IMPRESSION: Mild atelectasis in the right base. Electronically Signed   By: Dorise Bullion III M.D.   On: 06/08/2022 08:26    Microbiology: Results for orders placed or performed during the hospital encounter of 06/08/22  Resp panel by RT-PCR (RSV, Flu A&B, Covid) Anterior Nasal Swab     Status: None   Collection Time: 06/08/22  7:59 AM   Specimen: Anterior Nasal Swab  Result Value Ref Range Status   SARS Coronavirus 2 by RT PCR  NEGATIVE NEGATIVE Final    Comment: (NOTE) SARS-CoV-2 target nucleic acids are NOT DETECTED.  The SARS-CoV-2 RNA is generally detectable in upper respiratory specimens during the acute phase of infection. The lowest concentration of SARS-CoV-2 viral copies this assay can detect is 138 copies/mL. A negative result does not preclude SARS-Cov-2 infection and should not be used as the sole basis for treatment or other patient management decisions. A negative result may occur with  improper specimen collection/handling, submission of specimen other than nasopharyngeal swab, presence of viral mutation(s) within the areas targeted by this assay, and inadequate number of viral copies(<138 copies/mL). A negative result must be combined with clinical observations, patient history, and epidemiological information. The expected result is Negative.  Fact Sheet for Patients:  EntrepreneurPulse.com.au  Fact Sheet for Healthcare Providers:  IncredibleEmployment.be  This test is no t yet approved or cleared by the [place] FDA and  has been authorized for detection and/or diagnosis of SARS-CoV-2 by FDA under an Emergency Use Authorization (EUA). This EUA will remain  in effect (meaning this test can be used) for the duration of  the COVID-19 declaration under Section 564(b)(1) of the Act, 21 U.S.C.section 360bbb-3(b)(1), unless the authorization is terminated  or revoked sooner.       Influenza A by PCR NEGATIVE NEGATIVE Final   Influenza B by PCR NEGATIVE NEGATIVE Final    Comment: (NOTE) The Xpert Xpress SARS-CoV-2/FLU/RSV plus assay is intended as an aid in the diagnosis of influenza from Nasopharyngeal swab specimens and should not be used as a sole basis for treatment. Nasal washings and aspirates are unacceptable for Xpert Xpress SARS-CoV-2/FLU/RSV testing.  Fact Sheet for Patients: EntrepreneurPulse.com.au  Fact Sheet for Healthcare Providers: IncredibleEmployment.be  This test is not yet approved or cleared by the [place] FDA and has been authorized for detection and/or diagnosis of SARS-CoV-2 by FDA under an Emergency Use Authorization (EUA). This EUA will remain in effect (meaning this test can be used) for the duration of the COVID-19 declaration under Section 564(b)(1) of the Act, 21 U.S.C. section 360bbb-3(b)(1), unless the authorization is terminated or revoked.     Resp Syncytial Virus by PCR NEGATIVE NEGATIVE Final    Comment: (NOTE) Fact Sheet for Patients: EntrepreneurPulse.com.au  Fact Sheet for Healthcare Providers: IncredibleEmployment.be  This test is not yet approved or cleared by the [place] FDA and has been authorized for detection and/or diagnosis of SARS-CoV-2 by FDA under an Emergency Use Authorization (EUA). This EUA will remain in effect (meaning this test can be used) for the duration of the COVID-19 declaration under Section 564(b)(1) of the Act, 21 U.S.C. section 360bbb-3(b)(1), unless the authorization is terminated or revoked.  Performed at [facility], [address] [place], [place] [address]   Urine Culture     Status: None   Collection Time: 06/09/22   9:11 AM   Specimen: Urine, Catheterized  Result Value Ref Range Status   Specimen Description   Final    URINE, CATHETERIZED Performed at [facility] [address], [place], [place] [address]    Special Requests   Final    NONE Performed at [facility], [place] [address] [place], [place] [address]    Culture   Final    NO GROWTH Performed at [facility], [place] [address] [place], [place] [address]    Report Status 06/10/2022 FINAL  Final   *Note: Due to a large number of results and/or encounters for the  requested time period, some results have not been displayed. A complete set of results can be found in Results Review.   Labs: CBC: Recent Labs  Lab 06/10/22 0415 06/11/22 0414 06/12/22 0423  WBC 10.2 10.8* 8.9  NEUTROABS 6.6 6.3 5.3  HGB 13.1 13.2 12.9  HCT 41.5 41.3 40.2  MCV 95.8 94.7 95.0  PLT 193 198 824   Basic Metabolic Panel: Recent Labs  Lab 06/10/22 0415 06/11/22 0414 06/12/22 0423 06/13/22 0407  NA 135 133* 134* 134*  K 4.4 4.6 4.8 4.5  CL 102 99 101 99  CO2 '26 27 27 28  '$ GLUCOSE 171* 277* 412* 445*  BUN 32* 35* 42* 41*  CREATININE 1.14* 0.77 0.87 0.85  CALCIUM 8.5* 8.2* 8.2* 8.4*  MG 2.4 1.9 2.1 2.1  PHOS  --   --  3.3 3.0   Liver Function Tests: Recent Labs  Lab 06/12/22 0423 06/13/22 0407  AST 19 27  ALT 16 20  ALKPHOS 108 103  BILITOT 0.3 0.3  PROT 6.6 6.2*  ALBUMIN 3.4* 3.3*   CBG: Recent Labs  Lab 06/12/22 0726 06/12/22 1215 06/12/22 1716 06/12/22 1954 06/13/22 0741  GLUCAP 304* 233* 332* 292* 292*   Discharge time spent: greater than 30 minutes.  Signed: Raiford Noble, DO [facility] 06/16/2022

## 2022-06-13 NOTE — Progress Notes (Signed)
SATURATION QUALIFICATIONS: (This note is used to comply with regulatory documentation for home oxygen)  Patient Saturations on Room Air at Rest = 94%  Patient Saturations on Room Air while Ambulating = 86%  Please briefly explain why patient needs home oxygen:  Patient experiences shortness of breath on exertion. Will require home O2.

## 2022-06-16 ENCOUNTER — Other Ambulatory Visit: Payer: Self-pay | Admitting: Internal Medicine

## 2022-06-16 ENCOUNTER — Telehealth: Payer: Self-pay

## 2022-06-16 ENCOUNTER — Encounter: Payer: Self-pay | Admitting: Adult Health

## 2022-06-16 ENCOUNTER — Ambulatory Visit (INDEPENDENT_AMBULATORY_CARE_PROVIDER_SITE_OTHER): Payer: Medicare Other | Admitting: Adult Health

## 2022-06-16 ENCOUNTER — Other Ambulatory Visit: Payer: Self-pay

## 2022-06-16 VITALS — BP 100/60 | HR 68 | Ht 62.0 in | Wt 192.0 lb

## 2022-06-16 DIAGNOSIS — N898 Other specified noninflammatory disorders of vagina: Secondary | ICD-10-CM

## 2022-06-16 DIAGNOSIS — N390 Urinary tract infection, site not specified: Secondary | ICD-10-CM | POA: Diagnosis not present

## 2022-06-16 MED ORDER — HYDROMORPHONE HCL 2 MG PO TABS: 2.0000 mg | ORAL_TABLET | Freq: Four times a day (QID) | ORAL | 0 refills | Status: DC | PRN

## 2022-06-16 NOTE — Telephone Encounter (Signed)
Dr. Dagoberto Ligas is off after call. Patient in down to 3 tablets. Will you please [facility]?    Filled  Written  ID  Drug  QTY  Days  Prescriber  RX #  Dispenser  Refill  Daily Dose*  Pymt Type  PMP  06/14/2022 06/05/2022 2  Lorazepam 1 Mg Tablet 90.00 30 Ge Plo [id number] [facility] (0327) 0/4 3.00 LME Comm Ins [place] 06/09/2022 06/05/2022 1  Belsomra 20 Mg Tablet 30.00 30 Ge Plo [id number] Exp (4317) 0/4 0.40 LME Comm Ins [place] 05/23/2022 03/27/2022 2  Pregabalin 200 Mg Capsule 60.00 30 Me Lov [id number] [facility] (0327) 1/5 2.68 LME Comm Ins [place] 05/16/2022 01/04/2022 2  Lorazepam 1 Mg Tablet 90.00 30 Ge Plo [id number] [facility] (0327) 0/2 3.00 LME Comm Ins [place] 05/06/2022 04/30/2022 2  Hydromorphone 2 Mg Tablet 120.00 30 Eu Tho [id number] [facility] (0327) 0/0 40.00 MME Comm Ins [place]

## 2022-06-16 NOTE — Patient Outreach (Signed)
  Care Coordination [facility] Note Transition Care Management Follow-up Telephone Call Date of discharge and from where: 06/13/22 Amber Stephenson dx acute on chronic heart failure How have you been since you were released from the hospital? I am wearing my oxygen at night.  I making sure I take my Lasix twice a day and I am weight daily Any questions or concerns? No  Items Reviewed: Did the pt receive and understand the discharge instructions provided? Yes  Medications obtained and verified? Yes  Other? No  Any new allergies since your discharge? No  Dietary orders reviewed? Yes Do you have support at home? Yes   Home Care and Equipment/Supplies: Were home health services ordered? Yes  If so, what is the name of the agency? [facility]  Has the agency set up a time to come to the patient's home? No called and verified that they have order and will contact today Were any new equipment or medical supplies ordered?  Yes: oxygen What is the name of the medical supply agency? [employer] Were you able to get the supplies/equipment? yes Do you have any questions related to the use of the equipment or supplies? No  Functional Questionnaire: (I = Independent and D = Dependent) ADLs: I  Bathing/Dressing- I  Meal Prep- I   Eating- I  Maintaining continence- I  Transferring/Ambulation- I  Managing Meds- I husband assists  Follow up appointments reviewed:  PCP Hospital f/u appt confirmed? Yes  Scheduled to see Dr. Livia Snellen on 06/18/22 @ 10:35AM. [place] [facility] f/u appt confirmed? Yes  Scheduled to see Heart and Vascular TOC on 06/23/22 @ 3 PM. Are transportation arrangements needed? No  If their condition worsens, is the pt aware to call PCP or go to the Emergency Dept.? Yes Was the patient provided with contact information for the PCP's office or ED? Yes Was to pt encouraged to call back with questions or concerns? Yes  SDOH assessments and interventions completed:   Yes SDOH Interventions Today     Flowsheet Row Most Recent Value  SDOH Interventions   Food Insecurity Interventions Intervention Not Indicated  Transportation Interventions Intervention Not Indicated       Care Coordination Interventions:  [name] and confirmed that order was received and they will call pt today. Reviewed importance of follow up with cardiology, Reviewed daily weights and when to call provider    Encounter Outcome:  Pt. Visit Completed   [name] RN, [facility], [place] Management Coordinator [facility] [phone]

## 2022-06-16 NOTE — Telephone Encounter (Signed)
PMP was Reviewed. [facility] out of stock she reports.

## 2022-06-16 NOTE — Progress Notes (Signed)
  Subjective:     Patient ID: Amber Stephenson, female   DOB: [birth year], 77 y.o.   MRN: [id number]  HPI Amber Stephenson is a 77 year old white female, married, PM,worked in, in complaining of vaginal itching she was in hospital last week for CHF and had UTI and has been on antibiotics she says. She says she has O2 has needed, she says today she it going to get manni/pedi.   PCP is Dr Livia Snellen  Review of Systems +vaginal itching   Reviewed past medical,surgical, social and family history. Reviewed medications and allergies.  Objective:   Physical Exam BP 100/60 (BP Location: Left Arm, Patient Position: Sitting, Cuff Size: Normal)   Pulse 68   Ht '5\' 2"'$  (1.575 m)   Wt 192 lb (87.1 kg)   BMI 35.12 kg/m     Skin warm and dry.Pelvic: external genitalia is normal in appearance no lesions, vagina: pale red, no discharge noted, used small Pederson speculum, urethra has no lesions or masses noted, cervix not seen today, uterus: normal size, shape and contour, non tender, no masses felt, adnexa: no masses or tenderness noted. Bladder is non tender and no masses felt. Painted vulva, vagina and rectal area with gentian violet Fall risk is hi  [facility] - 06/16/22 1525       Pregnancy Intention Screening   Does the patient want to become pregnant in the next year? N/A    Does the patient's partner want to become pregnant in the next year? N/A    Would the patient like to discuss contraceptive options today? N/A      Contraception Wrap Up   Current Method No Method - Other Reason   postmenopause   End Method No Method - Other Reason   postmenopause   Contraception Counseling Provided No            Examination chaperoned by Celene Squibb LPN Assessment:     1. Vaginal itching +itching vulva, and in side and on back to rectal area Painted with gentian violet Keep dry    Plan:     Follow up prn

## 2022-06-18 ENCOUNTER — Ambulatory Visit (INDEPENDENT_AMBULATORY_CARE_PROVIDER_SITE_OTHER): Payer: Medicare Other

## 2022-06-18 ENCOUNTER — Telehealth: Payer: Self-pay | Admitting: Physical Medicine and Rehabilitation

## 2022-06-18 ENCOUNTER — Encounter: Payer: Self-pay | Admitting: Family Medicine

## 2022-06-18 ENCOUNTER — Ambulatory Visit (INDEPENDENT_AMBULATORY_CARE_PROVIDER_SITE_OTHER): Payer: Medicare Other | Admitting: Family Medicine

## 2022-06-18 VITALS — BP 101/72 | HR 78 | Temp 97.0°F | Ht 62.0 in | Wt 194.6 lb

## 2022-06-18 DIAGNOSIS — D179 Benign lipomatous neoplasm, unspecified: Secondary | ICD-10-CM | POA: Diagnosis not present

## 2022-06-18 DIAGNOSIS — J4520 Mild intermittent asthma, uncomplicated: Secondary | ICD-10-CM | POA: Diagnosis not present

## 2022-06-18 DIAGNOSIS — I110 Hypertensive heart disease with heart failure: Secondary | ICD-10-CM | POA: Diagnosis not present

## 2022-06-18 DIAGNOSIS — F319 Bipolar disorder, unspecified: Secondary | ICD-10-CM | POA: Diagnosis not present

## 2022-06-18 DIAGNOSIS — I5032 Chronic diastolic (congestive) heart failure: Secondary | ICD-10-CM

## 2022-06-18 DIAGNOSIS — J9811 Atelectasis: Secondary | ICD-10-CM | POA: Diagnosis not present

## 2022-06-18 DIAGNOSIS — E1140 Type 2 diabetes mellitus with diabetic neuropathy, unspecified: Secondary | ICD-10-CM | POA: Diagnosis not present

## 2022-06-18 DIAGNOSIS — K760 Fatty (change of) liver, not elsewhere classified: Secondary | ICD-10-CM | POA: Diagnosis not present

## 2022-06-18 DIAGNOSIS — E7800 Pure hypercholesterolemia, unspecified: Secondary | ICD-10-CM | POA: Diagnosis not present

## 2022-06-18 DIAGNOSIS — I272 Pulmonary hypertension, unspecified: Secondary | ICD-10-CM | POA: Diagnosis not present

## 2022-06-18 DIAGNOSIS — G4700 Insomnia, unspecified: Secondary | ICD-10-CM | POA: Diagnosis not present

## 2022-06-18 DIAGNOSIS — G3184 Mild cognitive impairment, so stated: Secondary | ICD-10-CM | POA: Diagnosis not present

## 2022-06-18 DIAGNOSIS — G43109 Migraine with aura, not intractable, without status migrainosus: Secondary | ICD-10-CM | POA: Diagnosis not present

## 2022-06-18 DIAGNOSIS — J449 Chronic obstructive pulmonary disease, unspecified: Secondary | ICD-10-CM | POA: Diagnosis not present

## 2022-06-18 DIAGNOSIS — M545 Low back pain, unspecified: Secondary | ICD-10-CM | POA: Diagnosis not present

## 2022-06-18 DIAGNOSIS — A6000 Herpesviral infection of urogenital system, unspecified: Secondary | ICD-10-CM | POA: Diagnosis not present

## 2022-06-18 DIAGNOSIS — J9621 Acute and chronic respiratory failure with hypoxia: Secondary | ICD-10-CM | POA: Diagnosis not present

## 2022-06-18 DIAGNOSIS — I2510 Atherosclerotic heart disease of native coronary artery without angina pectoris: Secondary | ICD-10-CM | POA: Diagnosis not present

## 2022-06-18 DIAGNOSIS — G8929 Other chronic pain: Secondary | ICD-10-CM | POA: Diagnosis not present

## 2022-06-18 DIAGNOSIS — I482 Chronic atrial fibrillation, unspecified: Secondary | ICD-10-CM | POA: Diagnosis not present

## 2022-06-18 DIAGNOSIS — I5033 Acute on chronic diastolic (congestive) heart failure: Secondary | ICD-10-CM | POA: Diagnosis not present

## 2022-06-18 DIAGNOSIS — G2581 Restless legs syndrome: Secondary | ICD-10-CM | POA: Diagnosis not present

## 2022-06-18 DIAGNOSIS — F419 Anxiety disorder, unspecified: Secondary | ICD-10-CM | POA: Diagnosis not present

## 2022-06-18 DIAGNOSIS — R059 Cough, unspecified: Secondary | ICD-10-CM | POA: Diagnosis not present

## 2022-06-18 DIAGNOSIS — R918 Other nonspecific abnormal finding of lung field: Secondary | ICD-10-CM | POA: Diagnosis not present

## 2022-06-18 DIAGNOSIS — K7581 Nonalcoholic steatohepatitis (NASH): Secondary | ICD-10-CM | POA: Diagnosis not present

## 2022-06-18 DIAGNOSIS — N289 Disorder of kidney and ureter, unspecified: Secondary | ICD-10-CM | POA: Diagnosis not present

## 2022-06-18 DIAGNOSIS — G4733 Obstructive sleep apnea (adult) (pediatric): Secondary | ICD-10-CM | POA: Diagnosis not present

## 2022-06-18 DIAGNOSIS — E669 Obesity, unspecified: Secondary | ICD-10-CM | POA: Diagnosis not present

## 2022-06-18 NOTE — Progress Notes (Signed)
Subjective:  Patient ID: Amber Stephenson, female    DOB: [birth year]  Age: 77 y.o. MRN: [id number]  CC: Hospitalization Follow-up   HPI Amber Stephenson presents for Hospital folllow up for CHF. "Water in my lungs."  In [facility] [place] from 06/08/22 to 06/13/22. Dced with antibiotic, diuretic and  supplemental O2 due to sats in the 80s with exertion. She says her legs didn't swell but she was dyspneic. Presented at the ED due to panic over her breathing.  Review of DC summary suggests she had acute on chronic diastolic heart failure. Also Dr. Kelton Pillar put her on [facility] for her Diabetes during her stay.      06/18/2022   10:30 AM 04/17/2022    2:29 PM 03/31/2022   11:35 AM  Depression screen PHQ 2/9  Decreased Interest 0 0 0  Down, Depressed, Hopeless 0 0 0  PHQ - 2 Score 0 0 0  Altered sleeping  3 0  Tired, decreased energy  3 0  Change in appetite  0 0  Feeling bad or failure about yourself   1 0  Trouble concentrating  3 0  Moving slowly or fidgety/restless  1 0  Suicidal thoughts  0 0  PHQ-9 Score  11 0  Difficult doing work/chores   Not difficult at all    History Amber Stephenson has a past medical history of Acute exacerbation of CHF (congestive heart failure) ([place]) (12/13/2020), Acute on chronic diastolic CHF (congestive heart failure)/HFpEF Exacerbation (12/16/2020), Acute on chronic respiratory failure with hypoxia ([place]) (01/22/2019), Acute respiratory failure with hypoxia ([place]) (12/16/2020), Asthma, Atrial fibrillation with RVR (05/19/2017), Bipolar I disorder (01/23/2015), CAD (coronary artery disease), Cardiomegaly (01/12/2018), Chronic anticoagulation (05/30/2015), Chronic atrial fibrillation ([place]) (01/04/2015), Chronic back pain, Chronic diastolic CHF (congestive heart failure) (05/30/2015), Diabetes mellitus, type 2, without complication, Diabetic neuropathy (02/06/2016), Dysrhythmia, Essential hypertension, Herpes genitalis in women (07/16/2015), Hyperlipidemia, Hypoxia (02/01/2019),  Insomnia (01/23/2015), Lipoma (02/08/2015), Major depressive disorder, Migraine headache with aura (02/12/2016), 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04/21/2019), NAFL (nonalcoholic fatty liver) (17/61/6073), OSA (obstructive sleep apnea) (02/24/2019), Pulmonary hypertension, Renal insufficiency, and RLS (restless legs syndrome) (04/27/2015).   She has a past surgical history that includes THIGH SURGERY; Shoulder surgery (Right); Breast reduction surgery; Eye surgery (Right); Hammer toe surgery; LEFT HEART CATH AND CORONARY ANGIOGRAPHY (N/A, 02/03/2018); and Reverse shoulder arthroplasty (Right, 08/19/2019).   Her family history includes Alcohol abuse in her sister; Alzheimer's disease in her father; Diabetes in her brother, mother, and sister; Heart disease in her brother, mother, and sister; Mental illness in her brother; Stroke in her brother.She reports that she has never smoked. She has never used smokeless tobacco. She reports that she does not currently use alcohol. She reports that she does not use drugs.    ROS Review of Systems  Constitutional: Negative.   HENT:  Negative for congestion.   Eyes:  Negative for visual disturbance.  Respiratory:  Positive for shortness of breath (if off O2 and active.).   Cardiovascular:  Negative for chest pain.  Gastrointestinal:  Negative for abdominal pain, constipation, diarrhea, nausea and vomiting.  Genitourinary:  Negative for difficulty urinating.  Musculoskeletal:  Negative for arthralgias and myalgias.  Neurological:  Negative for headaches.  Psychiatric/Behavioral:  Negative for sleep disturbance.     Objective:  BP 101/72   Pulse 78   Temp (!) 97 F (36.1 C)   Ht '5\' 2"'$  (1.575 m)   Wt 194 lb 10.1 oz (88.3 kg)   SpO2 98%   BMI  35.60 kg/m   BP Readings from Last 3 Encounters:  06/18/22 101/72  06/16/22 100/60  06/13/22 114/80    Wt Readings from Last 3 Encounters:  06/18/22 194 lb 10.1 oz (88.3 kg)  06/16/22 192 lb (87.1 kg)   06/13/22 197 lb 8.5 oz (89.6 kg)     Physical Exam Constitutional:      General: She is not in acute distress.    Appearance: She is well-developed.  Cardiovascular:     Rate and Rhythm: Normal rate and regular rhythm.  Pulmonary:     Breath sounds: Normal breath sounds.  Musculoskeletal:        General: Normal range of motion.  Skin:    General: Skin is warm and dry.  Neurological:     Mental Status: She is alert and oriented to person, place, and time.       Assessment & Plan:   Amber Stephenson was seen today for hospitalization follow-up.  Diagnoses and all orders for this visit:  Chronic diastolic heart failure ([place]) -     CMP14+EGFR -     CBC with Differential/Platelet -     Magnesium -     Phosphorus -     DG Chest 2 View; Future       I am having Amber Stephenson. Amber Stephenson maintain her Vitamin D (Cholecalciferol), albuterol, albuterol, Fluticasone-Salmeterol, LORazepam, Prolia, naloxone, escitalopram, ARIPiprazole, metoprolol succinate, metoprolol succinate, benztropine, nystatin ointment, ondansetron, lisinopril, potassium chloride SA, polyethylene glycol powder, pregabalin, hydrOXYzine, atorvastatin, apixaban, Dexcom G6 Transmitter, Emgality, Dexcom G7 Sensor, icosapent Ethyl, insulin lispro, metFORMIN, Insulin Pen Needle, Lantus SoloStar, Acetaminophen (TYLENOL PO), furosemide, doxycycline, senna-docusate, OXcarbazepine, guaiFENesin, HYDROmorphone, nystatin, and Mounjaro.  Allergies as of 06/18/2022       Reactions   Iodine Anaphylaxis   Ivp Dye [iodinated Contrast Media] Anaphylaxis, Swelling   Throat closes   Latex Other (See Comments)   Latex tape pulls skin with it   Tizanidine Other (See Comments)   Weakness, goofy, bad dreams        Medication List        Accurate as of June 18, 2022  5:21 PM. If you have any questions, ask your nurse or doctor.          albuterol (2.5 MG/3ML) 0.083% nebulizer solution Commonly known as: PROVENTIL Take 3 mLs  (2.5 mg total) by nebulization every 6 (six) hours as needed for wheezing or shortness of breath. What changed: when to take this   albuterol 108 (90 Base) MCG/ACT inhaler Commonly known as: VENTOLIN HFA Inhale 2 puffs into the lungs every 6 (six) hours as needed for wheezing or shortness of breath. What changed: when to take this   apixaban 5 MG Tabs tablet Commonly known as: Eliquis TAKE 1 TABLET(5 MG) BY MOUTH TWICE DAILY What changed:  how much to take how to take this when to take this additional instructions   ARIPiprazole 2 MG tablet Commonly known as: ABILIFY Take 2 mg by mouth at bedtime.   atorvastatin 40 MG tablet Commonly known as: LIPITOR TAKE 1 TABLET(40 MG) BY MOUTH DAILY What changed:  how much to take how to take this when to take this additional instructions   benztropine 1 MG tablet Commonly known as: COGENTIN Take 1 tablet (1 mg total) by mouth 2 (two) times daily.   Dexcom G6 Transmitter Misc Use as instructed to check blood sugar. Change every 90 days   Dexcom G7 Sensor Misc Change sensor every 10 days   doxycycline 100  MG tablet Commonly known as: VIBRA-TABS Take 1 tablet (100 mg total) by mouth every 12 (twelve) hours for 5 days.   Emgality 120 MG/ML Soaj Generic drug: Galcanezumab-gnlm One injection per month What changed:  how much to take how to take this when to take this additional instructions   escitalopram 10 MG tablet Commonly known as: LEXAPRO Take 10 mg by mouth daily.   Fluticasone-Salmeterol 500-50 MCG/DOSE Aepb Commonly known as: Advair Diskus Inhale 1 puff into the lungs in the morning and at bedtime. What changed:  when to take this reasons to take this   furosemide 40 MG tablet Commonly known as: LASIX Take 1 tablet (40 mg total) by mouth 2 (two) times daily.   guaiFENesin 600 MG 12 hr tablet Commonly known as: MUCINEX Take 1 tablet (600 mg total) by mouth 2 (two) times daily for 5 days.   HYDROmorphone 2  MG tablet Commonly known as: Dilaudid Take 1 tablet (2 mg total) by mouth every 6 (six) hours as needed for severe pain.   hydrOXYzine 25 MG capsule Commonly known as: VISTARIL Take 1 capsule (25 mg total) by mouth every 8 (eight) hours as needed.   icosapent Ethyl 1 g capsule Commonly known as: VASCEPA TAKE 2 CAPSULES TWICE A DAY WITH MEALS What changed: See the new instructions.   insulin lispro 100 UNIT/ML KwikPen Commonly known as: HumaLOG KwikPen Max daily 50 units What changed:  how much to take how to take this when to take this additional instructions   Insulin Pen Needle 29G X 12MM Misc 1 Device by Does not apply route daily in the afternoon.   Lantus SoloStar 100 UNIT/ML Solostar Pen Generic drug: insulin glargine Inject 68 Units into the skin daily. What changed:  how much to take when to take this   lisinopril 10 MG tablet Commonly known as: ZESTRIL Take 1 tablet (10 mg total) by mouth daily.   LORazepam 1 MG tablet Commonly known as: ATIVAN Take 1 tablet (1 mg total) by mouth 2 (two) times daily. And give 2 tablets by mouth at bedtime What changed:  how much to take additional instructions   metFORMIN 500 MG tablet Commonly known as: GLUCOPHAGE Take 1 tablet (500 mg total) by mouth 2 (two) times daily with a meal. TAKE 1 TABLET(500 MG) BY MOUTH TWICE DAILY AFTER A MEAL What changed: additional instructions   metoprolol succinate 25 MG 24 hr tablet Commonly known as: Toprol XL Take 1 tablet (25 mg total) by mouth in the morning and at bedtime. What changed: additional instructions   metoprolol succinate 100 MG 24 hr tablet Commonly known as: TOPROL-XL TAKE 1 TABLET IN THE MORNING AND AT BEDTIME What changed:  how much to take how to take this when to take this additional instructions   Mounjaro 5 MG/0.5ML Pen Generic drug: tirzepatide Inject 5 mg into the skin once a week.   naloxone 4 MG/0.1ML Liqd nasal spray kit Commonly known as:  NARCAN   nystatin ointment Commonly known as: MYCOSTATIN Apply 1 Application topically 2 (two) times daily. What changed:  when to take this reasons to take this   nystatin powder Commonly known as: MYCOSTATIN/NYSTOP Apply 1 Application topically as needed (rash). What changed: Another medication with the same name was changed. Make sure you understand how and when to take each.   ondansetron 8 MG disintegrating tablet Commonly known as: ZOFRAN-ODT Take 1 tablet (8 mg total) by mouth every 6 (six) hours as needed for nausea  or vomiting.   OXcarbazepine 150 MG tablet Commonly known as: TRILEPTAL Take 1 tablet (150 mg total) by mouth 2 (two) times daily.   polyethylene glycol powder 17 GM/SCOOP powder Commonly known as: GLYCOLAX/MIRALAX Take 17 g by mouth as needed for mild constipation.   potassium chloride SA 20 MEQ tablet Commonly known as: KLOR-CON M Take 1 tablet (20 mEq total) by mouth daily. What changed:  how much to take when to take this   pregabalin 200 MG capsule Commonly known as: LYRICA TAKE 1 CAPSULE(200 MG) BY MOUTH TWICE DAILY What changed: See the new instructions.   Prolia 60 MG/ML Sosy injection Generic drug: denosumab Inject 60 mg into the skin every 6 (six) months.   senna-docusate 8.6-50 MG tablet Commonly known as: Senokot-S Take 1 tablet by mouth at bedtime.   TYLENOL PO Take 2 tablets by mouth as needed (pain/headache).   Vitamin D (Cholecalciferol) 25 MCG (1000 UT) Tabs Take 1,000 Units by mouth daily in the afternoon.         Follow-up: Return in about 3 months (around 09/17/2022), or if symptoms worsen or fail to improve.  [name], M.D.

## 2022-06-18 NOTE — Telephone Encounter (Signed)
[facility],  Spoke with pharmacist. They will fill prescription.

## 2022-06-18 NOTE — Telephone Encounter (Signed)
[facility] called about patients Dilaudid medication.  This is the 1st time that patient has gotten this from them.  She was getting it locally before.  Please call [phone] Ref# [id number].

## 2022-06-19 ENCOUNTER — Telehealth: Payer: Self-pay | Admitting: Physical Medicine and Rehabilitation

## 2022-06-19 ENCOUNTER — Telehealth: Payer: Self-pay | Admitting: Adult Health

## 2022-06-19 LAB — CMP14+EGFR
ALT: 16 IU/L (ref 0–32)
AST: 19 IU/L (ref 0–40)
Albumin/Globulin Ratio: 1.6 (ref 1.2–2.2)
Albumin: 4.2 g/dL (ref 3.8–4.8)
Alkaline Phosphatase: 146 IU/L — ABNORMAL HIGH (ref 44–121)
BUN/Creatinine Ratio: 34 — ABNORMAL HIGH (ref 12–28)
BUN: 36 mg/dL — ABNORMAL HIGH (ref 8–27)
Bilirubin Total: 0.3 mg/dL (ref 0.0–1.2)
CO2: 23 mmol/L (ref 20–29)
Calcium: 9.9 mg/dL (ref 8.7–10.3)
Chloride: 98 mmol/L (ref 96–106)
Creatinine, Ser: 1.05 mg/dL — ABNORMAL HIGH (ref 0.57–1.00)
Globulin, Total: 2.6 g/dL (ref 1.5–4.5)
Glucose: 235 mg/dL — ABNORMAL HIGH (ref 70–99)
Potassium: 4.8 mmol/L (ref 3.5–5.2)
Sodium: 137 mmol/L (ref 134–144)
Total Protein: 6.8 g/dL (ref 6.0–8.5)
eGFR: 55 mL/min/{1.73_m2} — ABNORMAL LOW (ref >59)

## 2022-06-19 LAB — CBC WITH DIFFERENTIAL/PLATELET
Basophils Absolute: 0.1 10*3/uL (ref 0.0–0.2)
Basos: 1 %
EOS (ABSOLUTE): 0.2 10*3/uL (ref 0.0–0.4)
Eos: 2 %
Hematocrit: 42.6 % (ref 34.0–46.6)
Hemoglobin: 13.9 g/dL (ref 11.1–15.9)
Immature Grans (Abs): 0 10*3/uL (ref 0.0–0.1)
Immature Granulocytes: 0 %
Lymphocytes Absolute: 3.3 10*3/uL — ABNORMAL HIGH (ref 0.7–3.1)
Lymphs: 30 %
MCH: 30 pg (ref 26.6–33.0)
MCHC: 32.6 g/dL (ref 31.5–35.7)
MCV: 92 fL (ref 79–97)
Monocytes Absolute: 0.8 10*3/uL (ref 0.1–0.9)
Monocytes: 7 %
Neutrophils Absolute: 6.7 10*3/uL (ref 1.4–7.0)
Neutrophils: 60 %
Platelets: 255 10*3/uL (ref 150–450)
RBC: 4.63 x10E6/uL (ref 3.77–5.28)
RDW: 12.2 % (ref 11.7–15.4)
WBC: 11 10*3/uL — ABNORMAL HIGH (ref 3.4–10.8)

## 2022-06-19 LAB — PHOSPHORUS: Phosphorus: 4.9 mg/dL — ABNORMAL HIGH (ref 3.0–4.3)

## 2022-06-19 LAB — MAGNESIUM: Magnesium: 1.7 mg/dL (ref 1.6–2.3)

## 2022-06-19 MED ORDER — FLUCONAZOLE 150 MG PO TABS: ORAL_TABLET | ORAL | 0 refills | Status: DC

## 2022-06-19 NOTE — Telephone Encounter (Signed)
Will rx diflucan  

## 2022-06-19 NOTE — Addendum Note (Signed)
Addended by: Derrek Monaco A on: 06/19/2022 04:47 PM   Modules accepted: Orders

## 2022-06-19 NOTE — Telephone Encounter (Signed)
Patient called to let us know that no one has her Dilaudid in stock.  She would like to know what else she can take that is equal to it.  Please call patient at [phone].

## 2022-06-19 NOTE — Telephone Encounter (Signed)
Hydromorphone 2 mg & 4 mg  is on back order. Everywhere. Amber Stephenson does not have any pain medication on hand.   Do you want to send a new script for something else? If so please send to [facility] on [address]

## 2022-06-19 NOTE — Telephone Encounter (Signed)
Patient called to let you know she was still having symptoms and her bottom is very sore. Please advise.

## 2022-06-19 NOTE — Telephone Encounter (Addendum)
Pt states she is having pain, more on the inside. I spoke with JAG. She will send in med to [facility] on [address]. Pt aware can pick up later on. Pt voiced understanding. [place]

## 2022-06-20 ENCOUNTER — Telehealth (HOSPITAL_COMMUNITY): Payer: Self-pay

## 2022-06-20 ENCOUNTER — Telehealth: Payer: Self-pay | Admitting: Adult Health

## 2022-06-20 MED ORDER — MORPHINE SULFATE ER 15 MG PO TBCR: 15.0000 mg | EXTENDED_RELEASE_TABLET | Freq: Two times a day (BID) | ORAL | 0 refills | Status: DC

## 2022-06-20 NOTE — Telephone Encounter (Signed)
Duplicate task.

## 2022-06-20 NOTE — Telephone Encounter (Signed)
Pt is requesting medication for yeast infection to be called into the pharmacy

## 2022-06-20 NOTE — Telephone Encounter (Signed)
Pt aware Diflucan was sent in 06/19/22 to [facility] on [address]. Pt voiced understanding. [place]

## 2022-06-20 NOTE — Telephone Encounter (Signed)
Patient aware of Dr. Imelda Pillow advice & reply.

## 2022-06-20 NOTE — Telephone Encounter (Signed)
If you send in a new Rx. Please sent to the local [facility] on [address]

## 2022-06-20 NOTE — Addendum Note (Signed)
Addended by: Courtney Heys on: 06/20/2022 11:18 AM   Modules accepted: Orders

## 2022-06-20 NOTE — Telephone Encounter (Signed)
Called to confirm Heart & Vascular Transitions of Care appointment at 06/23/22. Patient reminded to bring all medications and pill box organizer with them. Confirmed patient has transportation. Gave directions, instructed to utilize [place] parking.  Confirmed appointment prior to ending call.

## 2022-06-23 ENCOUNTER — Ambulatory Visit (HOSPITAL_COMMUNITY)
Admit: 2022-06-23 | Discharge: 2022-06-23 | Disposition: A | Discharge status: Home / Self Care | Payer: Medicare Other | Attending: Cardiology | Admitting: Cardiology

## 2022-06-23 VITALS — BP 130/88 | HR 97 | Wt 197.0 lb

## 2022-06-23 DIAGNOSIS — N189 Chronic kidney disease, unspecified: Secondary | ICD-10-CM | POA: Insufficient documentation

## 2022-06-23 DIAGNOSIS — G4733 Obstructive sleep apnea (adult) (pediatric): Secondary | ICD-10-CM | POA: Insufficient documentation

## 2022-06-23 DIAGNOSIS — I480 Paroxysmal atrial fibrillation: Secondary | ICD-10-CM | POA: Diagnosis not present

## 2022-06-23 DIAGNOSIS — I5033 Acute on chronic diastolic (congestive) heart failure: Secondary | ICD-10-CM | POA: Diagnosis not present

## 2022-06-23 DIAGNOSIS — I2510 Atherosclerotic heart disease of native coronary artery without angina pectoris: Secondary | ICD-10-CM | POA: Insufficient documentation

## 2022-06-23 DIAGNOSIS — F319 Bipolar disorder, unspecified: Secondary | ICD-10-CM | POA: Insufficient documentation

## 2022-06-23 DIAGNOSIS — E785 Hyperlipidemia, unspecified: Secondary | ICD-10-CM | POA: Insufficient documentation

## 2022-06-23 DIAGNOSIS — I130 Hypertensive heart and chronic kidney disease with heart failure and stage 1 through stage 4 chronic kidney disease, or unspecified chronic kidney disease: Secondary | ICD-10-CM | POA: Diagnosis not present

## 2022-06-23 DIAGNOSIS — I5032 Chronic diastolic (congestive) heart failure: Secondary | ICD-10-CM

## 2022-06-23 DIAGNOSIS — Z79899 Other long term (current) drug therapy: Secondary | ICD-10-CM | POA: Diagnosis not present

## 2022-06-23 DIAGNOSIS — E1122 Type 2 diabetes mellitus with diabetic chronic kidney disease: Secondary | ICD-10-CM | POA: Insufficient documentation

## 2022-06-23 DIAGNOSIS — J45909 Unspecified asthma, uncomplicated: Secondary | ICD-10-CM | POA: Insufficient documentation

## 2022-06-23 LAB — BRAIN NATRIURETIC PEPTIDE: B Natriuretic Peptide: 306.7 pg/mL — ABNORMAL HIGH (ref 0.0–100.0)

## 2022-06-23 LAB — BASIC METABOLIC PANEL
Anion gap: 10 (ref 5–15)
BUN: 25 mg/dL — ABNORMAL HIGH (ref 8–23)
CO2: 30 mmol/L (ref 22–32)
Calcium: 9.9 mg/dL (ref 8.9–10.3)
Chloride: 99 mmol/L (ref 98–111)
Creatinine, Ser: 0.97 mg/dL (ref 0.44–1.00)
GFR, Estimated: >60 mL/min (ref >60)
Glucose, Bld: 173 mg/dL — ABNORMAL HIGH (ref 70–99)
Potassium: 5 mmol/L (ref 3.5–5.1)
Sodium: 139 mmol/L (ref 135–145)

## 2022-06-23 NOTE — Patient Instructions (Signed)
Great to see you today!  Labwork completed today --we will call with any abnormal values or items that need action.  Follow up with Cardiologist as previously scheduled.

## 2022-06-23 NOTE — Progress Notes (Signed)
HEART & VASCULAR TRANSITION OF CARE CONSULT NOTE     Referring Physician: Dr. Alfredia Ferguson  Primary Care: [name], MD Primary Cardiologist: Dr. Heron Nay   HPI: Referred to clinic by Dr. Alfredia Ferguson for heart failure consultation.   77 y/o female w/ h/o HFpEF, HTN, permanent AF on Eliquis, Type 2DM, nonobstructive CAD by cath in 2019, CKD lll, OSA in tolerant to CPAP, HLD, asthma and bipolar disorder.   She was admitted to [facility] 1/24 for acute respiratory failure with hypoxia in the setting of acute on chronic diastolic CHF exacerbation, required supp O2. Diuresed w/ IV Lasix. 2D echo showed hyperdynamic LVEF, 65-70%, RV normal, several LAE, no MR. After diuresis, was transitioned to PO Lasix. D/c wt reported at 194 lb. Was discharged home w/ PRN supp O2 and referred to pulmonology as well, has pending appt w/ Dr. [place] Bells.   Of note, hospital instructions were for patient to take Lasix 40 mg bid. She reports she has only been taking 20 mg bid.   She presents today for f/u. Here w/ her husband. C/w exertional dyspnea, NYHA Class II-early III. Also w/ orthopnea. Does not visibly appear significantly volume overloaded on exam today but she thinks she caries most of her edema in her abdomen. Her daily wts at home have been fairly stable. Wt up 3 lb today on our clinic scale. BP controlled. In chronic Afib, HR 90s. Reports full compliance w/ Eliquis. Her OSA is untreated. Refuses CPAP. She failed SGLT2i due to frequent UTIs. Was previously on Jardiance.   Has f/u in Dr. Theodosia Blender office on 2/2.    Cardiac Testing  1. Left ventricular ejection fraction, by estimation, is 65 to 70%. The  left ventricle has normal function. The left ventricle has no regional  wall motion abnormalities. There is mild left ventricular hypertrophy.  Left ventricular diastolic parameters  are indeterminate.   2. Right ventricular systolic function is normal. The right ventricular  size is normal.   3. Left atrial size  was severely dilated.   4. The mitral valve is normal in structure. No evidence of mitral valve  regurgitation. No evidence of mitral stenosis.   5. The aortic valve is normal in structure. Aortic valve regurgitation is  not visualized. No aortic stenosis is present.   6. The inferior vena cava is normal in size with greater than 50%  respiratory variability, suggesting right atrial pressure of 3 mmHg.     Review of Systems: [y] = yes, '[ ]'$  = no   General: Weight gain '[ ]'$ ; Weight loss '[ ]'$ ; Anorexia '[ ]'$ ; Fatigue '[ ]'$ ; Fever '[ ]'$ ; Chills '[ ]'$ ; Weakness '[ ]'$   Cardiac: Chest pain/pressure '[ ]'$ ; Resting SOB '[ ]'$ ; Exertional SOB '[ ]'$ ; Orthopnea '[ ]'$ ; Pedal Edema '[ ]'$ ; Palpitations '[ ]'$ ; Syncope '[ ]'$ ; Presyncope '[ ]'$ ; Paroxysmal nocturnal dyspnea'[ ]'$   Pulmonary: Cough '[ ]'$ ; Wheezing'[ ]'$ ; Hemoptysis'[ ]'$ ; Sputum '[ ]'$ ; Snoring '[ ]'$   GI: Vomiting'[ ]'$ ; Dysphagia'[ ]'$ ; Melena'[ ]'$ ; Hematochezia '[ ]'$ ; Heartburn'[ ]'$ ; Abdominal pain '[ ]'$ ; Constipation '[ ]'$ ; Diarrhea '[ ]'$ ; BRBPR '[ ]'$   GU: Hematuria'[ ]'$ ; Dysuria '[ ]'$ ; Nocturia'[ ]'$   Vascular: Pain in legs with walking '[ ]'$ ; Pain in feet with lying flat '[ ]'$ ; Non-healing sores '[ ]'$ ; Stroke '[ ]'$ ; TIA '[ ]'$ ; Slurred speech '[ ]'$ ;  Neuro: Headaches'[ ]'$ ; Vertigo'[ ]'$ ; Seizures'[ ]'$ ; Paresthesias'[ ]'$ ;Blurred vision '[ ]'$ ; Diplopia '[ ]'$ ; Vision changes '[ ]'$   Ortho/Skin: Arthritis '[ ]'$ ; Joint pain '[ ]'$ ;  Muscle pain '[ ]'$ ; Joint swelling '[ ]'$ ; Back Pain '[ ]'$ ; Rash '[ ]'$   Psych: Depression'[ ]'$ ; Anxiety'[ ]'$   Heme: Bleeding problems '[ ]'$ ; Clotting disorders '[ ]'$ ; Anemia '[ ]'$   Endocrine: Diabetes '[ ]'$ ; Thyroid dysfunction'[ ]'$    Past Medical History:  Diagnosis Date   Acute exacerbation of CHF (congestive heart failure) ([place]) 12/13/2020   Acute on chronic diastolic CHF (congestive heart failure)/HFpEF Exacerbation 12/16/2020   Acute on chronic respiratory failure with hypoxia ([place]) 01/22/2019   Acute respiratory failure with hypoxia ([place]) 12/16/2020   Asthma    Atrial fibrillation with RVR 05/19/2017   Bipolar I disorder 01/23/2015    CAD (coronary artery disease)    Nonobstructive; Managed by Dr. Bronson Ing   Cardiomegaly 01/12/2018   Chronic anticoagulation 05/30/2015   Chronic atrial fibrillation ([place]) 01/04/2015   Chronic back pain    Lower back   Chronic diastolic CHF (congestive heart failure) 05/30/2015   Diabetes mellitus, type 2, without complication    Diabetic neuropathy 02/06/2016   Dysrhythmia    A-Fib   Essential hypertension    Herpes genitalis in women 07/16/2015   Hyperlipidemia    Hypoxia 02/01/2019   Insomnia 01/23/2015   Lipoma 02/08/2015   Major depressive disorder    Migraine headache with aura 02/12/2016   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   NAFL (nonalcoholic fatty liver) 85/63/1497   OSA (obstructive sleep apnea) 02/24/2019   10/09/2018 - HST  - AHI 40.6    Pulmonary hypertension    Renal insufficiency    Managed by Dr. Wallace Keller   RLS (restless legs syndrome) 04/27/2015    Current Outpatient Medications  Medication Sig Dispense Refill   Acetaminophen (TYLENOL PO) Take 2 tablets by mouth as needed (pain/headache).     albuterol (PROVENTIL) (2.5 MG/3ML) 0.083% nebulizer solution Take 3 mLs (2.5 mg total) by nebulization every 6 (six) hours as needed for wheezing or shortness of breath. (Patient taking differently: Take 2.5 mg by nebulization as needed for wheezing or shortness of breath.) 150 mL 0   albuterol (VENTOLIN HFA) 108 (90 Base) MCG/ACT inhaler Inhale 2 puffs into the lungs every 6 (six) hours as needed for wheezing or shortness of breath. (Patient taking differently: Inhale 2 puffs into the lungs as needed for wheezing or shortness of breath.) 1 each 0   apixaban (ELIQUIS) 5 MG TABS tablet TAKE 1 TABLET(5 MG) BY MOUTH TWICE DAILY (Patient taking differently: Take 5 mg by mouth 2 (two) times daily.) 180 tablet 0   ARIPiprazole (ABILIFY) 2 MG tablet Take 2 mg by mouth at bedtime.     atorvastatin (LIPITOR) 40 MG tablet TAKE 1 TABLET(40 MG) BY MOUTH DAILY (Patient  taking differently: Take 40 mg by mouth daily.) 90 tablet 0   benztropine (COGENTIN) 1 MG tablet Take 1 tablet (1 mg total) by mouth 2 (two) times daily. 60 tablet 2   Continuous Blood Gluc Sensor (DEXCOM G7 SENSOR) MISC Change sensor every 10 days 9 each 3   Continuous Blood Gluc Transmit (DEXCOM G6 TRANSMITTER) MISC Use as instructed to check blood sugar. Change every 90 days 1 each 1   denosumab (PROLIA) 60 MG/ML SOSY injection Inject 60 mg into the skin every 6 (six) months. 1 mL 1   escitalopram (LEXAPRO) 10 MG tablet Take 10 mg by mouth daily.     fluconazole (DIFLUCAN) 150 MG tablet Take 1 now and can repeat 1 in 3 days 2 tablet 0   Fluticasone-Salmeterol (ADVAIR DISKUS) 500-50 MCG/DOSE AEPB  Inhale 1 puff into the lungs in the morning and at bedtime. (Patient taking differently: Inhale 1 puff into the lungs as needed (SOB).) 60 each 0   furosemide (LASIX) 40 MG tablet Take 1 tablet (40 mg total) by mouth 2 (two) times daily. 90 tablet 3   Galcanezumab-gnlm (EMGALITY) 120 MG/ML SOAJ One injection per month (Patient taking differently: Inject 120 mg into the skin every 30 (thirty) days.) 1 mL 0   hydrOXYzine (VISTARIL) 25 MG capsule Take 1 capsule (25 mg total) by mouth every 8 (eight) hours as needed. 90 capsule 1   icosapent Ethyl (VASCEPA) 1 g capsule TAKE 2 CAPSULES TWICE A DAY WITH MEALS (Patient taking differently: Take 2 g by mouth 2 (two) times daily.) 360 capsule 0   insulin glargine (LANTUS SOLOSTAR) 100 UNIT/ML Solostar Pen Inject 68 Units into the skin daily. (Patient taking differently: Inject 34 Units into the skin 2 (two) times daily.) 75 mL 3   insulin lispro (HUMALOG KWIKPEN) 100 UNIT/ML KwikPen Max daily 50 units (Patient taking differently: Inject 4-8 Units into the skin 3 (three) times daily.) 45 mL 6   Insulin Pen Needle 29G X 12MM MISC 1 Device by Does not apply route daily in the afternoon. 400 each 3   lisinopril (ZESTRIL) 10 MG tablet Take 1 tablet (10 mg total) by mouth  daily. 90 tablet 3   LORazepam (ATIVAN) 1 MG tablet Take 1 tablet (1 mg total) by mouth 2 (two) times daily. And give 2 tablets by mouth at bedtime (Patient taking differently: Take 1-2 mg by mouth 2 (two) times daily. Take 2 mg by mouth in the morning and 1 mg by mouth at bedtime) 90 tablet 0   metFORMIN (GLUCOPHAGE) 500 MG tablet Take 1 tablet (500 mg total) by mouth 2 (two) times daily with a meal. TAKE 1 TABLET(500 MG) BY MOUTH TWICE DAILY AFTER A MEAL (Patient taking differently: Take 500 mg by mouth 2 (two) times daily with a meal.) 180 tablet 3   metoprolol succinate (TOPROL XL) 25 MG 24 hr tablet Take 1 tablet (25 mg total) by mouth in the morning and at bedtime. (Patient taking differently: Take 25 mg by mouth in the morning and at bedtime. Take with 100 mg tablet twice daily) 180 tablet 3   metoprolol succinate (TOPROL-XL) 100 MG 24 hr tablet TAKE 1 TABLET IN THE MORNING AND AT BEDTIME (Patient taking differently: Take 100 mg by mouth in the morning and at bedtime. Take with 25 mg tablet twice daily.) 180 tablet 3   morphine (MS CONTIN) 15 MG 12 hr tablet Take 1 tablet (15 mg total) by mouth every 12 (twelve) hours. This is a long acting form, so take 1 tab 2x/day to keep pain more even- it is NOT a short acting pain med like you were taking- for chronic pain 60 tablet 0   naloxone (NARCAN) nasal spray 4 mg/0.1 mL      nystatin (MYCOSTATIN/NYSTOP) powder Apply 1 Application topically as needed (rash). 15 g 2   nystatin ointment (MYCOSTATIN) Apply 1 Application topically 2 (two) times daily. (Patient taking differently: Apply 1 Application topically as needed (rash).) 30 g 3   ondansetron (ZOFRAN-ODT) 8 MG disintegrating tablet Take 1 tablet (8 mg total) by mouth every 6 (six) hours as needed for nausea or vomiting. 20 tablet 1   OXcarbazepine (TRILEPTAL) 150 MG tablet Take 1 tablet (150 mg total) by mouth 2 (two) times daily. 60 tablet 0   potassium chloride  SA (KLOR-CON M) 20 MEQ tablet Take 1  tablet (20 mEq total) by mouth daily. (Patient taking differently: Take 10 mEq by mouth at bedtime.) 30 tablet 5   pregabalin (LYRICA) 200 MG capsule TAKE 1 CAPSULE(200 MG) BY MOUTH TWICE DAILY (Patient taking differently: Take 200 mg by mouth 2 (two) times daily.) 60 capsule 5   senna-docusate (SENOKOT-S) 8.6-50 MG tablet Take 1 tablet by mouth at bedtime. 30 tablet 0   tirzepatide (MOUNJARO) 5 MG/0.5ML Pen Inject 5 mg into the skin once a week.     Vitamin D, Cholecalciferol, 25 MCG (1000 UT) TABS Take 1,000 Units by mouth daily in the afternoon.     No current facility-administered medications for this encounter.   Facility-Administered Medications Ordered in Other Encounters  Medication Dose Route Frequency Provider Last Rate Last Admin   bupivacaine-epinephrine (MARCAINE W/ EPI) 0.5% -1:200000 injection    Anesthesia Intra-op Effie Berkshire, MD   12 mL at 01/21/19 0935    Allergies  Allergen Reactions   Iodine Anaphylaxis   Ivp Dye [Iodinated Contrast Media] Anaphylaxis and Swelling    Throat closes    Latex Other (See Comments)    Latex tape pulls skin with it   Tizanidine Other (See Comments)    Weakness, goofy, bad dreams      Social History   Socioeconomic History   Marital status: Married    Spouse name: Orpah Greek   Number of children: 3   Years of education: 15   Highest education level: Bachelor's degree (e.g., BA, AB, BS)  Occupational History   Occupation: Retired    Comment: marketing  Tobacco Use   Smoking status: Never   Smokeless tobacco: Never  Vaping Use   Vaping Use: Never used  Substance and Sexual Activity   Alcohol use: Not Currently    Alcohol/week: 0.0 standard drinks of alcohol   Drug use: No   Sexual activity: Yes    Partners: Male    Birth control/protection: Post-menopausal  Other Topics Concern   Not on file  Social History Narrative   Lives at home with husband.    They have 3 children who live away - [place], [place], and [place].    She is from [place] and most of her family lives there.   Her husbands's family live nearby   Right handed.   Social Determinants of Health   Financial Resource Strain: Low Risk  (12/13/2021)   Overall Financial Resource Strain (CARDIA)    Difficulty of Paying Living Expenses: Not hard at all  Food Insecurity: No Food Insecurity (06/16/2022)   Hunger Vital Sign    Worried About Running Out of Food in the Last Year: Never true    Ran Out of Food in the Last Year: Never true  Transportation Needs: No Transportation Needs (06/16/2022)   PRAPARE - Hydrologist (Medical): No    Lack of Transportation (Non-Medical): No  Physical Activity: Inactive (12/13/2021)   Exercise Vital Sign    Days of Exercise per Week: 0 days    Minutes of Exercise per Session: 0 min  Stress: No Stress Concern Present (12/13/2021)   [place]    Feeling of Stress : Not at all  Social Connections: [place] (12/13/2021)   Social Connection and Isolation Panel [NHANES]    Frequency of Communication with Friends and Family: More than three times a week    Frequency of Social Gatherings with  Friends and Family: Twice a week    Attends Religious Services: More than 4 times per year    Active Member of [employer] or Organizations: Yes    Attends Music therapist: More than 4 times per year    Marital Status: Married  Human resources officer Violence: Not At Risk (06/08/2022)   Humiliation, Afraid, Rape, and Kick questionnaire    Fear of Current or Ex-Partner: No    Emotionally Abused: No    Physically Abused: No    Sexually Abused: No      Family History  Problem Relation Age of Onset   Alzheimer's disease Father    Diabetes Mother    Heart disease Mother    Stroke Brother    Heart disease Brother    Mental illness Brother    Diabetes Brother    Heart disease Sister        CABG   Diabetes Sister     Alcohol abuse Sister     Vitals:   06/23/22 1512  BP: 130/88  Pulse: 97  SpO2: 95%  Weight: 89.4 kg (197 lb)    PHYSICAL EXAM: General:  Well appearing, moderately obese. No respiratory difficulty HEENT: normal Neck: supple. JVD not well visualized. Carotids 2+ bilat; no bruits. No lymphadenopathy or thryomegaly appreciated. Cor: PMI nondisplaced. Irregularly irregular rate & rhythm. No rubs, gallops or murmurs. Lungs: clear Abdomen: obese, soft, nontender, nondistended. No hepatosplenomegaly. No bruits or masses. Good bowel sounds. Extremities: no cyanosis, clubbing, rash, edema Neuro: alert & oriented x 3, cranial nerves grossly intact. moves all 4 extremities w/o difficulty. Affect pleasant.  ECG: not performed    ASSESSMENT & PLAN:  1. HFpEF - Echo 1/24 EF 65-70%, RV normal. Severe LAE, no MR - NYHA Class II-early III, confounded by obesity and deconditioning, also ? some potential degree of PH given untreated OSA and suspected underlying pulmonary disease (has pending consultation w/ pulmonology)    - not grossly fluid overloaded on exam, though assessment difficult due to body habitus. Based on symptoms, suspect at least mild fluid overload. Will check BNP and BMP today. If BNP elevated, will further increase Lasix to 40 mg bid (dose recommended at hospital d/c) - if continues w/ ongoing SOB despite diuretics, would strongly consider RHC  - previously failed SGLT2i given UTIs (stopped Jardiance) - consider addition of Spironolactone next pending renal fx on BMP  - EF out of range for Entresto  - continue Toprol XL  - discussed importance of continued daily wts, sodium and fluid restriction   2. HTN - controlled on current regimen - BMP today   3. Mild Nonobstructive CAD - cath 2019, no CP  - no ASA w/ Eliquis use - continue statin + ? blocker  4. PAF - chronic, rate controlled. On Eliquis   5. Type 2DM - per PCP   6. OSA - refuses CPAP    Referred to HFSW  (PCP, Medications, Transportation, ETOH Abuse, Drug Abuse, Insurance, Financial ): No  Refer to Pharmacy: No  Refer to Home Health: No Refer to Advanced Heart Failure Clinic: No  Refer to General Cardiology: Yes  Follow up  w/ Dr. Theodosia Blender Care team as planned. Has f/u on 2/2.

## 2022-06-24 ENCOUNTER — Encounter: Payer: Self-pay | Admitting: Adult Health

## 2022-06-24 ENCOUNTER — Ambulatory Visit (INDEPENDENT_AMBULATORY_CARE_PROVIDER_SITE_OTHER): Payer: Medicare Other | Admitting: Adult Health

## 2022-06-24 VITALS — BP 128/76 | HR 75 | Ht 62.0 in | Wt 192.0 lb

## 2022-06-24 DIAGNOSIS — L290 Pruritus ani: Secondary | ICD-10-CM | POA: Diagnosis not present

## 2022-06-24 DIAGNOSIS — B369 Superficial mycosis, unspecified: Secondary | ICD-10-CM | POA: Diagnosis not present

## 2022-06-24 DIAGNOSIS — N898 Other specified noninflammatory disorders of vagina: Secondary | ICD-10-CM

## 2022-06-24 NOTE — Progress Notes (Signed)
  Subjective:     Patient ID: Amber Stephenson, female   DOB: 1945-08-27, 77 y.o.   MRN: [id number]  HPI Amber Stephenson is a 77 year old white female,married,PM in complaining of still having vaginal itching, on back to rectal area, has taken 1 diflucan. She was painted with gentian violet 06/16/22.  PCP is Dr Livia Snellen  Review of Systems Still has vaginal itching, and on back to rectal area Reviewed past medical,surgical, social and family history. Reviewed medications and allergies.     Objective:   Physical Exam BP 128/76 (BP Location: Right Arm, Patient Position: Sitting, Cuff Size: Normal)   Pulse 75   Ht '5\' 2"'$  (1.575 m)   Wt 192 lb (87.1 kg)   BMI 35.12 kg/m     Skin warm and dry.Pelvic: external genitalia is normal in appearance no lesions, vagina:pale,urethra has no lesions or masses noted, cervix:not visualized, uterus: normal size, shape and contour, non tender, no masses felt, adnexa: no masses or tenderness noted. Bladder is non tender and no masses felt. Painted groin, vagina,vulva and back to rectal area with gentian violet, she said it burned some.   Examination chaperoned by Levy Pupa LPN Fall risk is high  [place] - 06/24/22 1521       Pregnancy Intention Screening   Does the patient want to become pregnant in the next year? N/A    Does the patient's partner want to become pregnant in the next year? N/A    Would the patient like to discuss contraceptive options today? N/A      Contraception Wrap Up   Current Method No Method - Other Reason   postmenopausal   Reason for No Current Contraceptive Method at Intake ([facility] Only) Other    End Method No Method - Other Reason   postmenopausal   Contraception Counseling Provided No             Assessment:     1. Vaginal itching Painted groin, vagina and vulva with gentian violet  2. Rectal itching Painted at anal area with gentian violet   3. Superficial fungus infection of skin Has taken 1 diflucan, will take another  tomorrow Painted with gentian violet    Plan:     Follow up in 9 days for recheck

## 2022-06-25 ENCOUNTER — Ambulatory Visit (INDEPENDENT_AMBULATORY_CARE_PROVIDER_SITE_OTHER): Payer: Medicare Other

## 2022-06-25 DIAGNOSIS — I5033 Acute on chronic diastolic (congestive) heart failure: Secondary | ICD-10-CM

## 2022-06-25 DIAGNOSIS — I482 Chronic atrial fibrillation, unspecified: Secondary | ICD-10-CM | POA: Diagnosis not present

## 2022-06-25 DIAGNOSIS — G8929 Other chronic pain: Secondary | ICD-10-CM

## 2022-06-25 DIAGNOSIS — J4520 Mild intermittent asthma, uncomplicated: Secondary | ICD-10-CM

## 2022-06-25 DIAGNOSIS — J9621 Acute and chronic respiratory failure with hypoxia: Secondary | ICD-10-CM | POA: Diagnosis not present

## 2022-06-25 DIAGNOSIS — J9811 Atelectasis: Secondary | ICD-10-CM | POA: Diagnosis not present

## 2022-06-25 DIAGNOSIS — E7800 Pure hypercholesterolemia, unspecified: Secondary | ICD-10-CM

## 2022-06-25 DIAGNOSIS — K7581 Nonalcoholic steatohepatitis (NASH): Secondary | ICD-10-CM

## 2022-06-25 DIAGNOSIS — G4733 Obstructive sleep apnea (adult) (pediatric): Secondary | ICD-10-CM

## 2022-06-25 DIAGNOSIS — M545 Low back pain, unspecified: Secondary | ICD-10-CM

## 2022-06-25 DIAGNOSIS — I272 Pulmonary hypertension, unspecified: Secondary | ICD-10-CM | POA: Diagnosis not present

## 2022-06-25 DIAGNOSIS — E1140 Type 2 diabetes mellitus with diabetic neuropathy, unspecified: Secondary | ICD-10-CM | POA: Diagnosis not present

## 2022-06-25 DIAGNOSIS — G4700 Insomnia, unspecified: Secondary | ICD-10-CM

## 2022-06-25 DIAGNOSIS — I2510 Atherosclerotic heart disease of native coronary artery without angina pectoris: Secondary | ICD-10-CM

## 2022-06-25 DIAGNOSIS — G3184 Mild cognitive impairment, so stated: Secondary | ICD-10-CM | POA: Diagnosis not present

## 2022-06-25 DIAGNOSIS — N289 Disorder of kidney and ureter, unspecified: Secondary | ICD-10-CM

## 2022-06-25 DIAGNOSIS — I110 Hypertensive heart disease with heart failure: Secondary | ICD-10-CM

## 2022-06-26 DIAGNOSIS — I482 Chronic atrial fibrillation, unspecified: Secondary | ICD-10-CM | POA: Diagnosis not present

## 2022-06-26 DIAGNOSIS — I110 Hypertensive heart disease with heart failure: Secondary | ICD-10-CM | POA: Diagnosis not present

## 2022-06-26 DIAGNOSIS — I272 Pulmonary hypertension, unspecified: Secondary | ICD-10-CM | POA: Diagnosis not present

## 2022-06-26 DIAGNOSIS — I5033 Acute on chronic diastolic (congestive) heart failure: Secondary | ICD-10-CM | POA: Diagnosis not present

## 2022-06-26 DIAGNOSIS — I2510 Atherosclerotic heart disease of native coronary artery without angina pectoris: Secondary | ICD-10-CM | POA: Diagnosis not present

## 2022-06-26 DIAGNOSIS — J9621 Acute and chronic respiratory failure with hypoxia: Secondary | ICD-10-CM | POA: Diagnosis not present

## 2022-06-27 ENCOUNTER — Inpatient Hospital Stay (HOSPITAL_COMMUNITY)
Admission: EM | Admit: 2022-06-27 | Discharge: 2022-06-29 | DRG: 871 | Disposition: A | Discharge status: Home Health | Payer: Medicare Other | Attending: Internal Medicine | Admitting: Internal Medicine

## 2022-06-27 ENCOUNTER — Other Ambulatory Visit: Payer: Self-pay

## 2022-06-27 ENCOUNTER — Emergency Department (HOSPITAL_COMMUNITY): Payer: Medicare Other

## 2022-06-27 ENCOUNTER — Encounter (HOSPITAL_COMMUNITY): Payer: Self-pay | Admitting: Emergency Medicine

## 2022-06-27 ENCOUNTER — Encounter (HOSPITAL_COMMUNITY): Payer: Self-pay

## 2022-06-27 DIAGNOSIS — I959 Hypotension, unspecified: Secondary | ICD-10-CM | POA: Diagnosis not present

## 2022-06-27 DIAGNOSIS — I509 Heart failure, unspecified: Secondary | ICD-10-CM

## 2022-06-27 DIAGNOSIS — R030 Elevated blood-pressure reading, without diagnosis of hypertension: Secondary | ICD-10-CM | POA: Diagnosis not present

## 2022-06-27 DIAGNOSIS — Z9981 Dependence on supplemental oxygen: Secondary | ICD-10-CM

## 2022-06-27 DIAGNOSIS — E1140 Type 2 diabetes mellitus with diabetic neuropathy, unspecified: Secondary | ICD-10-CM | POA: Diagnosis present

## 2022-06-27 DIAGNOSIS — R0600 Dyspnea, unspecified: Secondary | ICD-10-CM | POA: Diagnosis not present

## 2022-06-27 DIAGNOSIS — Y712 Prosthetic and other implants, materials and accessory cardiovascular devices associated with adverse incidents: Secondary | ICD-10-CM | POA: Diagnosis not present

## 2022-06-27 DIAGNOSIS — R6521 Severe sepsis with septic shock: Secondary | ICD-10-CM | POA: Diagnosis not present

## 2022-06-27 DIAGNOSIS — N179 Acute kidney failure, unspecified: Secondary | ICD-10-CM

## 2022-06-27 DIAGNOSIS — E1165 Type 2 diabetes mellitus with hyperglycemia: Secondary | ICD-10-CM | POA: Diagnosis present

## 2022-06-27 DIAGNOSIS — I2510 Atherosclerotic heart disease of native coronary artery without angina pectoris: Secondary | ICD-10-CM | POA: Diagnosis present

## 2022-06-27 DIAGNOSIS — G2581 Restless legs syndrome: Secondary | ICD-10-CM | POA: Diagnosis present

## 2022-06-27 DIAGNOSIS — Z820 Family history of epilepsy and other diseases of the nervous system: Secondary | ICD-10-CM

## 2022-06-27 DIAGNOSIS — Z91041 Radiographic dye allergy status: Secondary | ICD-10-CM

## 2022-06-27 DIAGNOSIS — R579 Shock, unspecified: Secondary | ICD-10-CM | POA: Diagnosis not present

## 2022-06-27 DIAGNOSIS — Z833 Family history of diabetes mellitus: Secondary | ICD-10-CM

## 2022-06-27 DIAGNOSIS — I5032 Chronic diastolic (congestive) heart failure: Secondary | ICD-10-CM | POA: Diagnosis present

## 2022-06-27 DIAGNOSIS — J9611 Chronic respiratory failure with hypoxia: Secondary | ICD-10-CM | POA: Diagnosis present

## 2022-06-27 DIAGNOSIS — Z7901 Long term (current) use of anticoagulants: Secondary | ICD-10-CM | POA: Diagnosis not present

## 2022-06-27 DIAGNOSIS — J9811 Atelectasis: Secondary | ICD-10-CM | POA: Diagnosis not present

## 2022-06-27 DIAGNOSIS — G9341 Metabolic encephalopathy: Secondary | ICD-10-CM

## 2022-06-27 DIAGNOSIS — I952 Hypotension due to drugs: Secondary | ICD-10-CM

## 2022-06-27 DIAGNOSIS — R571 Hypovolemic shock: Principal | ICD-10-CM | POA: Diagnosis present

## 2022-06-27 DIAGNOSIS — T82838A Hemorrhage of vascular prosthetic devices, implants and grafts, initial encounter: Secondary | ICD-10-CM | POA: Diagnosis not present

## 2022-06-27 DIAGNOSIS — E669 Obesity, unspecified: Secondary | ICD-10-CM | POA: Diagnosis present

## 2022-06-27 DIAGNOSIS — I482 Chronic atrial fibrillation, unspecified: Secondary | ICD-10-CM | POA: Diagnosis present

## 2022-06-27 DIAGNOSIS — E1142 Type 2 diabetes mellitus with diabetic polyneuropathy: Secondary | ICD-10-CM

## 2022-06-27 DIAGNOSIS — G8929 Other chronic pain: Secondary | ICD-10-CM | POA: Diagnosis not present

## 2022-06-27 DIAGNOSIS — E785 Hyperlipidemia, unspecified: Secondary | ICD-10-CM | POA: Diagnosis not present

## 2022-06-27 DIAGNOSIS — Z79899 Other long term (current) drug therapy: Secondary | ICD-10-CM

## 2022-06-27 DIAGNOSIS — G4733 Obstructive sleep apnea (adult) (pediatric): Secondary | ICD-10-CM | POA: Diagnosis not present

## 2022-06-27 DIAGNOSIS — G4710 Hypersomnia, unspecified: Secondary | ICD-10-CM | POA: Diagnosis present

## 2022-06-27 DIAGNOSIS — R410 Disorientation, unspecified: Secondary | ICD-10-CM | POA: Diagnosis not present

## 2022-06-27 DIAGNOSIS — Z818 Family history of other mental and behavioral disorders: Secondary | ICD-10-CM

## 2022-06-27 DIAGNOSIS — Z888 Allergy status to other drugs, medicaments and biological substances status: Secondary | ICD-10-CM

## 2022-06-27 DIAGNOSIS — F319 Bipolar disorder, unspecified: Secondary | ICD-10-CM | POA: Diagnosis present

## 2022-06-27 DIAGNOSIS — A419 Sepsis, unspecified organism: Secondary | ICD-10-CM | POA: Diagnosis not present

## 2022-06-27 DIAGNOSIS — T501X5A Adverse effect of loop [high-ceiling] diuretics, initial encounter: Secondary | ICD-10-CM | POA: Diagnosis present

## 2022-06-27 DIAGNOSIS — R531 Weakness: Secondary | ICD-10-CM | POA: Diagnosis not present

## 2022-06-27 DIAGNOSIS — Z8249 Family history of ischemic heart disease and other diseases of the circulatory system: Secondary | ICD-10-CM

## 2022-06-27 DIAGNOSIS — Z794 Long term (current) use of insulin: Secondary | ICD-10-CM

## 2022-06-27 DIAGNOSIS — Z1152 Encounter for screening for COVID-19: Secondary | ICD-10-CM

## 2022-06-27 DIAGNOSIS — R918 Other nonspecific abnormal finding of lung field: Secondary | ICD-10-CM | POA: Diagnosis not present

## 2022-06-27 DIAGNOSIS — Z7984 Long term (current) use of oral hypoglycemic drugs: Secondary | ICD-10-CM

## 2022-06-27 DIAGNOSIS — Z452 Encounter for adjustment and management of vascular access device: Secondary | ICD-10-CM | POA: Diagnosis not present

## 2022-06-27 DIAGNOSIS — Z823 Family history of stroke: Secondary | ICD-10-CM

## 2022-06-27 DIAGNOSIS — I4821 Permanent atrial fibrillation: Secondary | ICD-10-CM | POA: Diagnosis present

## 2022-06-27 DIAGNOSIS — I110 Hypertensive heart disease with heart failure: Secondary | ICD-10-CM | POA: Diagnosis not present

## 2022-06-27 DIAGNOSIS — J45909 Unspecified asthma, uncomplicated: Secondary | ICD-10-CM | POA: Diagnosis present

## 2022-06-27 DIAGNOSIS — R0989 Other specified symptoms and signs involving the circulatory and respiratory systems: Secondary | ICD-10-CM | POA: Diagnosis not present

## 2022-06-27 DIAGNOSIS — Z7951 Long term (current) use of inhaled steroids: Secondary | ICD-10-CM

## 2022-06-27 DIAGNOSIS — R42 Dizziness and giddiness: Secondary | ICD-10-CM | POA: Diagnosis not present

## 2022-06-27 DIAGNOSIS — I10 Essential (primary) hypertension: Secondary | ICD-10-CM | POA: Diagnosis present

## 2022-06-27 DIAGNOSIS — G9340 Encephalopathy, unspecified: Secondary | ICD-10-CM

## 2022-06-27 DIAGNOSIS — Z91040 Latex allergy status: Secondary | ICD-10-CM

## 2022-06-27 DIAGNOSIS — I700 Atherosclerosis of aorta: Secondary | ICD-10-CM | POA: Diagnosis not present

## 2022-06-27 DIAGNOSIS — Z6835 Body mass index (BMI) 35.0-35.9, adult: Secondary | ICD-10-CM

## 2022-06-27 DIAGNOSIS — R0689 Other abnormalities of breathing: Secondary | ICD-10-CM | POA: Diagnosis not present

## 2022-06-27 DIAGNOSIS — J811 Chronic pulmonary edema: Secondary | ICD-10-CM | POA: Diagnosis not present

## 2022-06-27 DIAGNOSIS — M549 Dorsalgia, unspecified: Secondary | ICD-10-CM | POA: Diagnosis present

## 2022-06-27 HISTORY — DX: Acute kidney failure, unspecified: N17.9

## 2022-06-27 HISTORY — DX: Metabolic encephalopathy: G93.41

## 2022-06-27 LAB — URINALYSIS, W/ REFLEX TO CULTURE (INFECTION SUSPECTED)
Bilirubin Urine: NEGATIVE
Glucose, UA: NEGATIVE mg/dL
Hgb urine dipstick: NEGATIVE
Ketones, ur: NEGATIVE mg/dL
Leukocytes,Ua: NEGATIVE
Nitrite: NEGATIVE
Protein, ur: 30 mg/dL — AB
Specific Gravity, Urine: 1.017 (ref 1.005–1.030)
pH: 5 (ref 5.0–8.0)

## 2022-06-27 LAB — CBC WITH DIFFERENTIAL/PLATELET
Abs Immature Granulocytes: 0.03 10*3/uL (ref 0.00–0.07)
Basophils Absolute: 0 10*3/uL (ref 0.0–0.1)
Basophils Relative: 1 %
Eosinophils Absolute: 0.1 10*3/uL (ref 0.0–0.5)
Eosinophils Relative: 1 %
HCT: 44.9 % (ref 36.0–46.0)
Hemoglobin: 14.6 g/dL (ref 12.0–15.0)
Immature Granulocytes: 0 %
Lymphocytes Relative: 36 %
Lymphs Abs: 3.1 10*3/uL (ref 0.7–4.0)
MCH: 30.3 pg (ref 26.0–34.0)
MCHC: 32.5 g/dL (ref 30.0–36.0)
MCV: 93.2 fL (ref 80.0–100.0)
Monocytes Absolute: 0.7 10*3/uL (ref 0.1–1.0)
Monocytes Relative: 9 %
Neutro Abs: 4.6 10*3/uL (ref 1.7–7.7)
Neutrophils Relative %: 53 %
Platelets: 168 10*3/uL (ref 150–400)
RBC: 4.82 MIL/uL (ref 3.87–5.11)
RDW: 13 % (ref 11.5–15.5)
WBC: 8.6 10*3/uL (ref 4.0–10.5)
nRBC: 0 % (ref 0.0–0.2)

## 2022-06-27 LAB — COMPREHENSIVE METABOLIC PANEL
ALT: 16 U/L (ref 0–44)
AST: 29 U/L (ref 15–41)
Albumin: 3.7 g/dL (ref 3.5–5.0)
Alkaline Phosphatase: 80 U/L (ref 38–126)
Anion gap: 14 (ref 5–15)
BUN: 50 mg/dL — ABNORMAL HIGH (ref 8–23)
CO2: 25 mmol/L (ref 22–32)
Calcium: 9.2 mg/dL (ref 8.9–10.3)
Chloride: 97 mmol/L — ABNORMAL LOW (ref 98–111)
Creatinine, Ser: 2.73 mg/dL — ABNORMAL HIGH (ref 0.44–1.00)
GFR, Estimated: 18 mL/min — ABNORMAL LOW (ref >60)
Glucose, Bld: 225 mg/dL — ABNORMAL HIGH (ref 70–99)
Potassium: 4.2 mmol/L (ref 3.5–5.1)
Sodium: 136 mmol/L (ref 135–145)
Total Bilirubin: 0.6 mg/dL (ref 0.3–1.2)
Total Protein: 7.4 g/dL (ref 6.5–8.1)

## 2022-06-27 LAB — RESP PANEL BY RT-PCR (RSV, FLU A&B, COVID)  RVPGX2
Influenza A by PCR: NEGATIVE
Influenza B by PCR: NEGATIVE
Resp Syncytial Virus by PCR: NEGATIVE
SARS Coronavirus 2 by RT PCR: NEGATIVE

## 2022-06-27 LAB — GLUCOSE, CAPILLARY
Glucose-Capillary: 140 mg/dL — ABNORMAL HIGH (ref 70–99)
Glucose-Capillary: 171 mg/dL — ABNORMAL HIGH (ref 70–99)

## 2022-06-27 LAB — BLOOD GAS, ARTERIAL
Acid-Base Excess: 3.1 mmol/L — ABNORMAL HIGH (ref 0.0–2.0)
Bicarbonate: 30.2 mmol/L — ABNORMAL HIGH (ref 20.0–28.0)
Drawn by: 27016
FIO2: 28 %
O2 Saturation: 98 %
Patient temperature: 37
pCO2 arterial: 56 mmHg — ABNORMAL HIGH (ref 32–48)
pH, Arterial: 7.34 — ABNORMAL LOW (ref 7.35–7.45)
pO2, Arterial: 92 mmHg (ref 83–108)

## 2022-06-27 LAB — TROPONIN I (HIGH SENSITIVITY)
Troponin I (High Sensitivity): 15 ng/L (ref <18)
Troponin I (High Sensitivity): 14 ng/L (ref <18)

## 2022-06-27 LAB — I-STAT CHEM 8, ED
BUN: 45 mg/dL — ABNORMAL HIGH (ref 8–23)
Calcium, Ion: 1.01 mmol/L — ABNORMAL LOW (ref 1.15–1.40)
Chloride: 101 mmol/L (ref 98–111)
Creatinine, Ser: 3 mg/dL — ABNORMAL HIGH (ref 0.44–1.00)
Glucose, Bld: 224 mg/dL — ABNORMAL HIGH (ref 70–99)
HCT: 45 % (ref 36.0–46.0)
Hemoglobin: 15.3 g/dL — ABNORMAL HIGH (ref 12.0–15.0)
Potassium: 4.2 mmol/L (ref 3.5–5.1)
Sodium: 136 mmol/L (ref 135–145)
TCO2: 27 mmol/L (ref 22–32)

## 2022-06-27 LAB — LACTIC ACID, PLASMA
Lactic Acid, Venous: 1.9 mmol/L (ref 0.5–1.9)
Lactic Acid, Venous: 1.7 mmol/L (ref 0.5–1.9)
Lactic Acid, Venous: 4.3 mmol/L (ref 0.5–1.9)
Lactic Acid, Venous: 1.5 mmol/L (ref 0.5–1.9)

## 2022-06-27 LAB — MRSA NEXT GEN BY PCR, NASAL: MRSA by PCR Next Gen: NOT DETECTED

## 2022-06-27 LAB — PROCALCITONIN: Procalcitonin: <0.1 ng/mL

## 2022-06-27 LAB — APTT: aPTT: 29 seconds (ref 24–36)

## 2022-06-27 LAB — PROTIME-INR
INR: 1.7 — ABNORMAL HIGH (ref 0.8–1.2)
Prothrombin Time: 20.2 seconds — ABNORMAL HIGH (ref 11.4–15.2)

## 2022-06-27 MED ORDER — LACTATED RINGERS IV BOLUS (SEPSIS)
1000.0000 mL | Freq: Once | INTRAVENOUS | Status: AC
  Administered 2022-06-27: 1000 mL via INTRAVENOUS

## 2022-06-27 MED ORDER — INSULIN ASPART 100 UNIT/ML IJ SOLN
0.0000 [IU] | INTRAMUSCULAR | Status: DC
  Administered 2022-06-27: 4 [IU] via SUBCUTANEOUS
  Administered 2022-06-28 (×2): 3 [IU] via SUBCUTANEOUS
  Administered 2022-06-28 (×2): 7 [IU] via SUBCUTANEOUS
  Administered 2022-06-28: 3 [IU] via SUBCUTANEOUS
  Administered 2022-06-28 – 2022-06-29 (×2): 11 [IU] via SUBCUTANEOUS
  Administered 2022-06-29 (×2): 4 [IU] via SUBCUTANEOUS

## 2022-06-27 MED ORDER — ONDANSETRON HCL 4 MG PO TABS: 4.0000 mg | ORAL_TABLET | Freq: Four times a day (QID) | ORAL | Status: DC | PRN

## 2022-06-27 MED ORDER — HEPARIN (PORCINE) 25000 UT/250ML-% IV SOLN
1050.0000 [IU]/h | INTRAVENOUS | Status: DC
  Administered 2022-06-27: 950 [IU]/h via INTRAVENOUS
  Filled 2022-06-27: qty 250

## 2022-06-27 MED ORDER — LACTATED RINGERS IV SOLN: INTRAVENOUS | Status: DC

## 2022-06-27 MED ORDER — SODIUM CHLORIDE 0.9 % IV SOLN
2.0000 g | INTRAVENOUS | Status: DC
  Administered 2022-06-27: 2 g via INTRAVENOUS
  Filled 2022-06-27: qty 20

## 2022-06-27 MED ORDER — SODIUM CHLORIDE 0.9 % IV SOLN
500.0000 mg | INTRAVENOUS | Status: DC
  Administered 2022-06-27: 500 mg via INTRAVENOUS
  Filled 2022-06-27: qty 5

## 2022-06-27 MED ORDER — SODIUM CHLORIDE 0.9 % IV SOLN: 2.0000 g | INTRAVENOUS | Status: DC

## 2022-06-27 MED ORDER — SODIUM CHLORIDE 0.9 % IV SOLN: 500.0000 mg | INTRAVENOUS | Status: DC

## 2022-06-27 MED ORDER — LORAZEPAM 2 MG/ML IJ SOLN
0.5000 mg | Freq: Four times a day (QID) | INTRAMUSCULAR | Status: DC | PRN
  Administered 2022-06-28 (×2): 0.5 mg via INTRAVENOUS
  Filled 2022-06-27 (×2): qty 1

## 2022-06-27 MED ORDER — FLUTICASONE FUROATE-VILANTEROL 200-25 MCG/ACT IN AEPB
1.0000 | INHALATION_SPRAY | Freq: Every day | RESPIRATORY_TRACT | Status: DC
  Administered 2022-06-28 – 2022-06-29 (×2): 1 via RESPIRATORY_TRACT
  Filled 2022-06-27: qty 28

## 2022-06-27 MED ORDER — CHLORHEXIDINE GLUCONATE CLOTH 2 % EX PADS
6.0000 | MEDICATED_PAD | Freq: Every day | CUTANEOUS | Status: DC
  Administered 2022-06-27 – 2022-06-29 (×3): 6 via TOPICAL

## 2022-06-27 MED ORDER — NOREPINEPHRINE 4 MG/250ML-% IV SOLN
INTRAVENOUS | Status: AC
  Administered 2022-06-27: 2 ug/min via INTRAVENOUS
  Filled 2022-06-27: qty 250

## 2022-06-27 MED ORDER — ONDANSETRON HCL 4 MG/2ML IJ SOLN: 4.0000 mg | Freq: Four times a day (QID) | INTRAMUSCULAR | Status: DC | PRN

## 2022-06-27 MED ORDER — INSULIN GLARGINE-YFGN 100 UNIT/ML ~~LOC~~ SOLN
34.0000 [IU] | Freq: Every day | SUBCUTANEOUS | Status: DC
  Administered 2022-06-27 – 2022-06-28 (×2): 34 [IU] via SUBCUTANEOUS
  Filled 2022-06-27 (×3): qty 0.34

## 2022-06-27 MED ORDER — NOREPINEPHRINE 4 MG/250ML-% IV SOLN
0.0000 ug/min | INTRAVENOUS | Status: DC
  Administered 2022-06-28: 4 ug/min via INTRAVENOUS
  Filled 2022-06-27: qty 250

## 2022-06-27 MED ORDER — NALOXONE HCL 0.4 MG/ML IJ SOLN: 0.4000 mg | INTRAMUSCULAR | Status: DC | PRN

## 2022-06-27 NOTE — ED Triage Notes (Signed)
Per EMS pt coming from home- called out by husband stating he had a very hard time waking patient up this morning. Initial BP 60/40 and A-fib. patient c/o dizziness. On 2L [place] at baseline.

## 2022-06-27 NOTE — Progress Notes (Signed)
ANTICOAGULATION CONSULT NOTE - Initial Consult  Pharmacy Consult for heparin Indication: atrial fibrillation  Allergies  Allergen Reactions   Iodine Anaphylaxis   Ivp Dye [Iodinated Contrast Media] Anaphylaxis and Swelling    Throat closes    Latex Other (See Comments)    Latex tape pulls skin with it   Tizanidine Other (See Comments)    Weakness, goofy, bad dreams    Patient Measurements: Height: '5\' 2"'$  (157.5 cm) Weight: 86.2 kg (190 lb) IBW/kg (Calculated) : 50.1 Heparin Dosing Weight: 69.7 kg   Vital Signs: Temp: 97.6 F (36.4 C) (01/26 1411) Temp Source: Oral (01/26 1411) BP: 119/71 (01/26 1730) Pulse Rate: 74 (01/26 1730)  Labs: Recent Labs    06/27/22 1415 06/27/22 1416 06/27/22 1434 06/27/22 1651  HGB 14.6 15.3*  --   --   HCT 44.9 45.0  --   --   PLT 168  --   --   --   APTT  --   --  29  --   LABPROT  --   --  20.2*  --   INR  --   --  1.7*  --   CREATININE 2.73* 3.00*  --   --   TROPONINIHS 15  --   --  14    Estimated Creatinine Clearance: 16.5 mL/min (A) (by C-G formula based on SCr of 3 mg/dL (H)).   Medical History: Past Medical History:  Diagnosis Date   Acute exacerbation of CHF (congestive heart failure) ([place]) 12/13/2020   Acute on chronic diastolic CHF (congestive heart failure)/HFpEF Exacerbation 12/16/2020   Acute on chronic respiratory failure with hypoxia ([place]) 01/22/2019   Acute respiratory failure with hypoxia ([place]) 12/16/2020   Asthma    Atrial fibrillation with RVR 05/19/2017   Bipolar I disorder 01/23/2015   CAD (coronary artery disease)    Nonobstructive; Managed by Dr. Bronson Ing   Cardiomegaly 01/12/2018   Chronic anticoagulation 05/30/2015   Chronic atrial fibrillation ([place]) 01/04/2015   Chronic back pain    Lower back   Chronic diastolic CHF (congestive heart failure) 05/30/2015   Diabetes mellitus, type 2, without complication    Diabetic neuropathy 02/06/2016   Dysrhythmia    A-Fib   Essential hypertension     Herpes genitalis in women 07/16/2015   Hyperlipidemia    Hypoxia 02/01/2019   Insomnia 01/23/2015   Lipoma 02/08/2015   Major depressive disorder    Migraine headache with aura 02/12/2016   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   NAFL (nonalcoholic fatty liver) 40/03/2724   OSA (obstructive sleep apnea) 02/24/2019   10/09/2018 - HST  - AHI 40.6    Pulmonary hypertension    Renal insufficiency    Managed by Dr. Wallace Keller   RLS (restless legs syndrome) 04/27/2015    Medications:  Scheduled:   Assessment: 70 yof presenting with generalized weakness, hypotension and hypersomnolence. On apixaban for hx Afib (LD 1/25 PM).  Hgb 15.3, plt 168. No s/sx of bleeding. INR 1.7 on admission (apixaban can qualitatively increase INR). Scr 3 (CrCl 16 mL/min) - baseline Scr Scr ~0.8 per chart review.   Will monitor both aPTT and heparin level until correlate.  Goal of Therapy:  Heparin level 0.3-0.7 units/ml aPTT 66-102 seconds Monitor platelets by anticoagulation protocol: Yes   Plan:  Start heparin infusion at 950 units/hr Order heparin level and aPTT in 8 hours Monitor daily HL and aPTT until correlate, CBC, and for s/sx of bleeding   Twana Wileman P Elya Tarquinio 06/27/2022,5:47 PM

## 2022-06-27 NOTE — Progress Notes (Deleted)
Cardiology Office Note:    Date:  06/27/2022   ID:  Amber Stephenson, DOB 1946/01/24, MRN [id number]  PCP:  Amber Fraise, MD   [facility] Providers Cardiologist:  Amber Him, MD { Click to update primary MD,subspecialty MD or APP then REFRESH:1}    Referring MD: Amber Fraise, MD   Chief Complaint: ***  History of Present Illness:    Amber Stephenson is a *** 77 y.o. female with a hx of permanent AF on chronic anticoagulation, HTN, HFpEF, HLD, type 2 diabetes, asthma, bipolar disorder, nonobstructive CAD by [facility] 01/2018, stage III CKD, OSA (intolerant to CPAP), chronic leukocytosis, and obesity.   Initially seen by Dr. Percival Stephenson in 2016 for shortness of breath following admission for pneumonia.  She transitioned care to Dr. Bronson Stephenson in 2016 and is currently being managed by Dr. Harl Stephenson. She underwent low risk nuclear stress test 06/2015.  She completed high-resolution CT of the chest 2019 that revealed cardiomegaly and coronary atherosclerosis.  She had LHC 01/2018 due to exertional dyspnea that demonstrated nonobstructive coronary artery disease with a proximal 40% LAD stenosis and mid LAD 30% stenosis with elevated LVEDP.  2D echo 01/22/2019 demonstrated normal LV systolic function, EF 55 to 60%, mild aortic regurgitation.  With Dr. Radford Stephenson 07/2021 she recovering from the flu and endorsed dizziness with standing. Most recent 2D echo 11/2020 with EF 65 to 70%, no RWMA, moderate LVH, severely dilated LA/RA with trivial MR. A-fib was well-controlled and she was tolerating medications with no side effects.  Seen in follow-up 04/21/2022 by Amber Pancoast, NP at which time she was doing well. No changes were made to her treatment regimen and she was advised to follow-up in 6 months.  Admission 1/7-1/12/24, presented for increased dyspnea over the previous 2 to 3 days.  She reported difficulty catching her breath.  She had recently decreased her Lasix intake to avoid frequent urination.  BNP  mildly elevated at 245, elevated hs troponin 56 ? 34 ?29.  CXR revealed mild atelectasis in right base.  Respiratory panel was negative.  2D echo revealed normal LVEF 65 to 70%, no RWMA, mild LVH, indeterminate diastolic parameters, severely dilated LA, no significant valve disease. She had improvement with diuresis and was discharged on Lasix 40 mg twice daily and home O2.   Seen in consult by Amber Henri, PA with Advanced Heart Failure team on 06/23/22. She reported taking Lasix 20 mg BID rather than 40 mg BID as advised. Felt to have NYHA Class II-early III symptoms. Intolerance to SGLT2i due to frequent UTIs. Pending consultation with pulmonology at the time. BNP remained elevated and she was advised to increase Lasix to 40 mg BID and keep gen cardiology f/u for 07/04/22.  Today, she is     Past Medical History:  Diagnosis Date   Acute exacerbation of CHF (congestive heart failure) ([place]) 12/13/2020   Acute on chronic diastolic CHF (congestive heart failure)/HFpEF Exacerbation 12/16/2020   Acute on chronic respiratory failure with hypoxia ([place]) 01/22/2019   Acute respiratory failure with hypoxia ([place]) 12/16/2020   Asthma    Atrial fibrillation with RVR 05/19/2017   Bipolar I disorder 01/23/2015   CAD (coronary artery disease)    Nonobstructive; Managed by Dr. Bronson Stephenson   Cardiomegaly 01/12/2018   Chronic anticoagulation 05/30/2015   Chronic atrial fibrillation ([place]) 01/04/2015   Chronic back pain    Lower back   Chronic diastolic CHF (congestive heart failure) 05/30/2015   Diabetes mellitus, type 2, without complication  Diabetic neuropathy 02/06/2016   Dysrhythmia    A-Fib   Essential hypertension    Herpes genitalis in women 07/16/2015   Hyperlipidemia    Hypoxia 02/01/2019   Insomnia 01/23/2015   Lipoma 02/08/2015   Major depressive disorder    Migraine headache with aura 02/12/2016   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   NAFL (nonalcoholic fatty liver)  123456   OSA (obstructive sleep apnea) 02/24/2019   10/09/2018 - HST  - AHI 40.6    Pulmonary hypertension    Renal insufficiency    Managed by Dr. Wallace Stephenson   RLS (restless legs syndrome) 04/27/2015    Past Surgical History:  Procedure Laterality Date   BREAST REDUCTION SURGERY     EYE SURGERY Right    cateracts   HAMMER TOE SURGERY     LEFT HEART CATH AND CORONARY ANGIOGRAPHY N/A 02/03/2018   Procedure: LEFT HEART CATH AND CORONARY ANGIOGRAPHY;  Surgeon: Martinique, Peter M, MD;  Location: [facility];  Service: Cardiovascular;  Laterality: N/A;   REVERSE SHOULDER ARTHROPLASTY Right 08/19/2019   Procedure: REVERSE SHOULDER ARTHROPLASTY;  Surgeon: Amber Cedars, MD;  Location: [facility];  Service: Orthopedics;  Laterality: Right;  interscalene block   SHOULDER SURGERY Right    "I BROKE MY SHOUDLER   THIGH SURGERY     "TO REMOVE A TUMOR "    Current Medications: No outpatient medications have been marked as taking for the 07/04/22 encounter (Appointment) with Amber Stephenson, Amber Schwab, NP.     Allergies:   Iodine, Ivp dye [iodinated contrast media], Latex, and Tizanidine   Social History   Socioeconomic History   Marital status: Married    Spouse name: Amber Stephenson   Number of children: 3   Years of education: 15   Highest education level: Bachelor's degree (e.g., BA, AB, BS)  Occupational History   Occupation: Retired    Comment: marketing  Tobacco Use   Smoking status: Never   Smokeless tobacco: Never  Vaping Use   Vaping Use: Never used  Substance and Sexual Activity   Alcohol use: Not Currently    Alcohol/week: 0.0 standard drinks of alcohol   Drug use: No   Sexual activity: Yes    Partners: Male    Birth control/protection: Post-menopausal  Other Topics Concern   Not on file  Social History Narrative   Lives at home with husband.    They have 3 children who live away - [place], [place], and [place].   She is from [place] and most of her family lives there.   Her  husbands's family live nearby   Right handed.   Social Determinants of Health   Financial Resource Strain: Low Risk  (12/13/2021)   Overall Financial Resource Strain (CARDIA)    Difficulty of Paying Living Expenses: Not hard at all  Food Insecurity: No Food Insecurity (06/16/2022)   Hunger Vital Sign    Worried About Running Out of Food in the Last Year: Never true    Ran Out of Food in the Last Year: Never true  Transportation Needs: No Transportation Needs (06/16/2022)   PRAPARE - Hydrologist (Medical): No    Lack of Transportation (Non-Medical): No  Physical Activity: Inactive (12/13/2021)   Exercise Vital Sign    Days of Exercise per Week: 0 days    Minutes of Exercise per Session: 0 min  Stress: No Stress Concern Present (12/13/2021)   [place]  Feeling of Stress : Not at all  Social Connections: Socially Integrated (12/13/2021)   Social Connection and Isolation Panel [NHANES]    Frequency of Communication with Friends and Family: More than three times a week    Frequency of Social Gatherings with Friends and Family: Twice a week    Attends Religious Services: More than 4 times per year    Active Member of [employer] or Organizations: Yes    Attends Music therapist: More than 4 times per year    Marital Status: Married     Family History: The patient's ***family history includes Alcohol abuse in her sister; Alzheimer's disease in her father; Diabetes in her brother, mother, and sister; Heart disease in her brother, mother, and sister; Mental illness in her brother; Stroke in her brother.  ROS:   Please see the history of present illness.    *** All other systems reviewed and are negative.  Labs/Other Studies Reviewed:    The following studies were reviewed today:  Echo 06/09/2022 1. Left ventricular ejection fraction, by estimation, is 65 to 70%. The  left ventricle  has normal function. The left ventricle has no regional  wall motion abnormalities. There is mild left ventricular hypertrophy.  Left ventricular diastolic parameters  are indeterminate.   2. Right ventricular systolic function is normal. The right ventricular  size is normal.   3. Left atrial size was severely dilated.   4. The mitral valve is normal in structure. No evidence of mitral valve  regurgitation. No evidence of mitral stenosis.   5. The aortic valve is normal in structure. Aortic valve regurgitation is  not visualized. No aortic stenosis is present.   6. The inferior vena cava is normal in size with greater than 50%  respiratory variability, suggesting right atrial pressure of 3 mmHg.   LHC 02/03/2018 Prox LAD lesion is 40% stenosed. Mid LAD lesion is 30% stenosed. Ost 1st Mrg to 1st Mrg lesion is 30% stenosed. LV end diastolic pressure is moderately elevated.   1. Nonobstructive CAD with left dominant circulation 2. Moderately elevated LVEDP (post hydration)   Plan: medical therapy. Will resume Eliquis this pm. Continue diuretic therapy.   No indication for antiplatelet therapy at this time.  Recent Labs: 06/18/2022: ALT 16; Hemoglobin 13.9; Magnesium 1.7; Platelets 255 06/23/2022: B Natriuretic Peptide 306.7; BUN 25; Creatinine, Ser 0.97; Potassium 5.0; Sodium 139  Recent Lipid Panel    Component Value Date/Time   CHOL 161 04/03/2021 1442   TRIG 311 (H) 04/03/2021 1442   HDL 41 04/03/2021 1442   CHOLHDL 3.9 04/03/2021 1442   LDLCALC 70 04/03/2021 1442   LDLDIRECT 62 03/17/2016 1614     Risk Assessment/Calculations:   {Does this patient have ATRIAL FIBRILLATION?[phone]}       Physical Exam:    VS:  There were no vitals taken for this visit.    Wt Readings from Last 3 Encounters:  06/24/22 192 lb (87.1 kg)  06/23/22 197 lb (89.4 kg)  06/18/22 194 lb 10.1 oz (88.3 kg)     GEN: *** Well nourished, well developed in no acute distress HEENT:  Normal NECK: No JVD; No carotid bruits CARDIAC: ***RRR, no murmurs, rubs, gallops RESPIRATORY:  Clear to auscultation without rales, wheezing or rhonchi  ABDOMEN: Soft, non-tender, non-distended MUSCULOSKELETAL:  No edema; No deformity. *** pedal pulses, ***bilaterally SKIN: Warm and dry NEUROLOGIC:  Alert and oriented x 3 PSYCHIATRIC:  Normal affect   EKG:  EKG is *** ordered today.  The ekg ordered today demonstrates ***  No BP recorded.  {Refresh Note OR Click here to enter BP  :1}***    Diagnoses:    No diagnosis found. Assessment and Plan:     HFpEF: NYHA Class II-early III. Echo 06/09/22 with EF 65-70%, RV normal, severe LAE, no MR. Question of underlying pulmonary hypertension given untreated OSA  Hypertension:  CAD: Mild nonobstructive CAD by cath 2019  Hyperlipidemia LDL goal < 70: LDL 70 on 04/03/2021  {Are you ordering a CV Procedure (e.g. stress test, cath, DCCV, TEE, etc)?   Press F2        :[id number]   Disposition:  Medication Adjustments/Labs and Tests Ordered: Current medicines are reviewed at length with the patient today.  Concerns regarding medicines are outlined above.  No orders of the defined types were placed in this encounter.  No orders of the defined types were placed in this encounter.   There are no Patient Instructions on file for this visit.   Signed, Emmaline Life, NP  06/27/2022 8:57 AM    [place]

## 2022-06-27 NOTE — Progress Notes (Signed)
[facility] following for sepsis protocol. 

## 2022-06-27 NOTE — Sepsis Progress Note (Signed)
Notified bedside nurse of need to draw repeat lactic acid.,

## 2022-06-27 NOTE — H&P (Addendum)
History and Physical    Patient: Amber Stephenson KXF:[id number] DOB: February 18, 1946 DOA: 06/27/2022 DOS: the patient was seen and examined on 06/27/2022 PCP: [name], MD  Patient coming from: Home  Chief Complaint:  Chief Complaint  Patient presents with   Dizziness   Hypotension   HPI: Amber Stephenson is a 77 y.o. female with medical history significant of heart failure with preserved EF, atrial fibrillation on chronic anticoagulation, chronic back pain on opiate management, diabetes type 2, diabetic neuropathy, hypertension, OSA, bipolar, pulmonary hypertension, asthma.  Was brought to the hospital via EMS secondary to generalized weakness, hypotension, hypersomnolence.  As the patient is hypersomnolent, history is obtained by the patient's spouse, who is present at bedside.  He went to wake the patient up this morning to get ready for the day.  Patient was having a hard time arousing and would easily fall back asleep within a couple of minutes.  He checked her blood pressure, and he found that the blood pressure was very low: Her blood pressure was around 70/40.  Due to her symptoms, he called EMS.  Here, she was found to be hypotensive.  Despite IV fluids, she remained hypotensive.  A central line was placed and pressors started.  Patient had complained of cough, antibiotics were started and blood cultures obtained.  EDP consulted with PCCM, who felt that this was more hypovolemic shock rather than septic shock and that the patient was fluid depleted.  Of note, the patient had increased her Lasix a few days ago.  She has been taking it fairly regularly and her husband notes that she has been urinating very frequently.  She had also made a medication change of Dilaudid to morphine.  Review of Systems: As mentioned in the history of present illness. All other systems reviewed and are negative. Past Medical History:  Diagnosis Date   Acute exacerbation of CHF (congestive heart failure)  ([place]) 12/13/2020   Acute on chronic diastolic CHF (congestive heart failure)/HFpEF Exacerbation 12/16/2020   Acute on chronic respiratory failure with hypoxia ([place]) 01/22/2019   Acute respiratory failure with hypoxia ([place]) 12/16/2020   Asthma    Atrial fibrillation with RVR 05/19/2017   Bipolar I disorder 01/23/2015   CAD (coronary artery disease)    Nonobstructive; Managed by Dr. 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 Wallace Keller   RLS (restless legs syndrome) 04/27/2015   Past Surgical History:  Procedure Laterality Date   BREAST REDUCTION SURGERY     EYE SURGERY Right    cateracts   HAMMER TOE SURGERY     LEFT HEART CATH AND CORONARY ANGIOGRAPHY N/A 02/03/2018   Procedure: LEFT HEART CATH AND CORONARY ANGIOGRAPHY;  Surgeon: Martinique, Peter  M, MD;  Location: [facility];  Service: Cardiovascular;  Laterality: N/A;   REVERSE SHOULDER ARTHROPLASTY Right 08/19/2019   Procedure: REVERSE SHOULDER ARTHROPLASTY;  Surgeon: Netta Cedars, MD;  Location: [facility];  Service: Orthopedics;  Laterality: Right;  interscalene block   SHOULDER SURGERY Right    "I BROKE MY SHOUDLER   THIGH SURGERY     "TO REMOVE A TUMOR "   Social History:  reports that she has never  smoked. She has never used smokeless tobacco. She reports that she does not currently use alcohol. She reports that she does not use drugs.  Allergies  Allergen Reactions   Iodine Anaphylaxis   Ivp Dye [Iodinated Contrast Media] Anaphylaxis and Swelling    Throat closes    Latex Other (See Comments)    Latex tape pulls skin with it   Tizanidine Other (See Comments)    Weakness, goofy, bad dreams    Family History  Problem Relation Age of Onset   Alzheimer's disease Father    Diabetes Mother    Heart disease Mother    Stroke Brother    Heart disease Brother    Mental illness Brother    Diabetes Brother    Heart disease Sister        CABG   Diabetes Sister    Alcohol abuse Sister     Prior to Admission medications   Medication Sig Start Date End Date Taking? Authorizing Provider  Acetaminophen (TYLENOL PO) Take 2 tablets by mouth as needed (pain/headache).   Yes [provider]  albuterol (PROVENTIL) (2.5 MG/3ML) 0.083% nebulizer solution Take 3 mLs (2.5 mg total) by nebulization every 6 (six) hours as needed for wheezing or shortness of breath. Patient taking differently: Take 2.5 mg by nebulization as needed for wheezing or shortness of breath. 08/09/20  Yes Gerlene Fee, NP  albuterol (VENTOLIN HFA) 108 (90 Base) MCG/ACT inhaler Inhale 2 puffs into the lungs every 6 (six) hours as needed for wheezing or shortness of breath. Patient taking differently: Inhale 2 puffs into the lungs as needed for wheezing or shortness of breath. 08/09/20  Yes Gerlene Fee, NP  apixaban (ELIQUIS) 5 MG TABS tablet TAKE 1 TABLET(5 MG) BY MOUTH TWICE DAILY Patient taking differently: Take 5 mg by mouth 2 (two) times daily. 04/30/22  Yes Stacks, Cletus Gash, MD  ARIPiprazole (ABILIFY) 2 MG tablet Take 2 mg by mouth at bedtime. 10/07/21  Yes [provider]  atorvastatin (LIPITOR) 40 MG tablet TAKE 1 TABLET(40 MG) BY MOUTH DAILY Patient taking differently: Take 40 mg by mouth daily.  04/30/22  Yes [name], MD  benztropine (COGENTIN) 1 MG tablet Take 1 tablet (1 mg total) by mouth 2 (two) times daily. 12/11/21  Yes [name], MD  denosumab (PROLIA) 60 MG/ML SOSY injection Inject 60 mg into the skin every 6 (six) months. 08/14/20  Yes Stacks, Cletus Gash, MD  escitalopram (LEXAPRO) 10 MG tablet Take 10 mg by mouth daily. 10/13/21  Yes [provider]  Fluticasone-Salmeterol (ADVAIR DISKUS) 500-50 MCG/DOSE AEPB Inhale 1 puff into the lungs in the morning and at bedtime. Patient taking differently: Inhale 1 puff into the lungs as needed (SOB). 08/09/20  Yes Gerlene Fee, NP  furosemide (LASIX) 40 MG tablet Take 1 tablet (40 mg total) by mouth 2 (two) times daily. Patient taking differently: Take 20 mg by mouth 2 (two) times daily. 06/13/22  Yes Sheikh, Omair Latif, DO  Galcanezumab-gnlm [facility]) 120 MG/ML SOAJ One injection per  month Patient taking differently: Inject 120 mg into the skin every 30 (thirty) days. 05/19/22  Yes Rondel Jumbo, PA-C  hydrOXYzine (VISTARIL) 25 MG capsule Take 1 capsule (25 mg total) by mouth every 8 (eight) hours as needed. 03/31/22  Yes Hawks, Christy A, FNP  icosapent Ethyl (VASCEPA) 1 g capsule TAKE 2 CAPSULES TWICE A DAY WITH MEALS Patient taking differently: Take 2 g by mouth 2 (two) times daily. 05/28/22  Yes Stacks, Cletus Gash, MD  insulin glargine (LANTUS SOLOSTAR) 100 UNIT/ML Solostar Pen Inject 68 Units into the skin daily. Patient taking differently: Inject 34 Units into the skin 2 (two) times daily. 06/09/22  Yes Shamleffer, Melanie Crazier, MD  insulin lispro (HUMALOG KWIKPEN) 100 UNIT/ML KwikPen Max daily 50 units Patient taking differently: Inject 4-8 Units into the skin 3 (three) times daily. 05/28/22  Yes Shamleffer, Melanie Crazier, MD  lisinopril (ZESTRIL) 10 MG tablet Take 1 tablet (10 mg total) by mouth daily. 01/14/22  Yes Stacks, Cletus Gash, MD  LORazepam (ATIVAN) 1 MG tablet Take 1 tablet (1 mg total) by mouth 2 (two)  times daily. And give 2 tablets by mouth at bedtime Patient taking differently: Take 1-2 mg by mouth 2 (two) times daily. Take 2 mg by mouth in the morning and 1 mg by mouth at bedtime 08/09/20  Yes Gerlene Fee, NP  metFORMIN (GLUCOPHAGE) 500 MG tablet Take 1 tablet (500 mg total) by mouth 2 (two) times daily with a meal. TAKE 1 TABLET(500 MG) BY MOUTH TWICE DAILY AFTER A MEAL Patient taking differently: Take 500 mg by mouth 2 (two) times daily with a meal. 05/28/22  Yes Shamleffer, Melanie Crazier, MD  metoprolol succinate (TOPROL XL) 25 MG 24 hr tablet Take 1 tablet (25 mg total) by mouth in the morning and at bedtime. Patient taking differently: Take 25 mg by mouth in the morning and at bedtime. Take with 100 mg tablet twice daily 11/13/21  Yes Turner, Eber Hong, MD  metoprolol succinate (TOPROL-XL) 100 MG 24 hr tablet TAKE 1 TABLET IN THE MORNING AND AT BEDTIME Patient taking differently: Take 100 mg by mouth in the morning and at bedtime. Take with 25 mg tablet twice daily. 11/13/21  Yes Turner, Eber Hong, MD  morphine (MS CONTIN) 15 MG 12 hr tablet Take 1 tablet (15 mg total) by mouth every 12 (twelve) hours. This is a long acting form, so take 1 tab 2x/day to keep pain more even- it is NOT a short acting pain med like you were taking- for chronic pain 06/20/22  Yes Lovorn, Jinny Blossom, MD  nystatin (MYCOSTATIN/NYSTOP) powder Apply 1 Application topically as needed (rash). 06/16/22  Yes Shamleffer, Melanie Crazier, MD  nystatin ointment (MYCOSTATIN) Apply 1 Application topically 2 (two) times daily. Patient taking differently: Apply 1 Application topically as needed (rash). 12/27/21  Yes Estill Dooms, NP  ondansetron (ZOFRAN-ODT) 8 MG disintegrating tablet Take 1 tablet (8 mg total) by mouth every 6 (six) hours as needed for nausea or vomiting. 01/13/22  Yes [name], MD  OXcarbazepine (TRILEPTAL) 150 MG tablet Take 1 tablet (150 mg total) by mouth 2 (two) times daily. 06/13/22  Yes Sheikh, Omair  Latif, DO  potassium chloride SA (KLOR-CON M) 20 MEQ tablet Take 1 tablet (20 mEq total) by mouth daily. Patient taking differently: Take 10 mEq by mouth at bedtime. 01/27/22  Yes Stacks, Cletus Gash, MD  pregabalin (LYRICA) 200 MG capsule TAKE 1 CAPSULE(200 MG) BY MOUTH TWICE DAILY Patient taking differently: Take 200 mg by  mouth 2 (two) times daily. 03/27/22  Yes Lovorn, Jinny Blossom, MD  senna-docusate (SENOKOT-S) 8.6-50 MG tablet Take 1 tablet by mouth at bedtime. 06/13/22  Yes Sheikh, Omair Latif, DO  tirzepatide [facility]) 5 MG/0.5ML Pen Inject 5 mg into the skin once a week.   Yes [provider]  Vitamin D, Cholecalciferol, 25 MCG (1000 UT) TABS Take 1,000 Units by mouth daily in the afternoon. 07/26/20  Yes [provider]  Continuous Blood Gluc Sensor (DEXCOM G7 SENSOR) MISC Change sensor every 10 days 05/20/22   Shamleffer, Melanie Crazier, MD  Continuous Blood Gluc Transmit (DEXCOM G6 TRANSMITTER) MISC Use as instructed to check blood sugar. Change every 90 days 05/12/22   Shamleffer, Melanie Crazier, MD  fluconazole (DIFLUCAN) 150 MG tablet Take 1 now and can repeat 1 in 3 days Patient not taking: Reported on 06/27/2022 06/19/22   Derrek Monaco A, NP  Insulin Pen Needle 29G X 12MM MISC 1 Device by Does not apply route daily in the afternoon. 05/28/22   Shamleffer, Melanie Crazier, MD  naloxone [facility]) nasal spray 4 mg/0.1 mL Place 1 spray into the nose once. 08/08/21   [provider]    Physical Exam: Vitals:   06/27/22 1620 06/27/22 1630 06/27/22 1650 06/27/22 1700  BP: (!) 154/86 138/74 127/62 138/60  Pulse: 69  66 74  Resp: '18 18 17 17  '$ Temp:      TempSrc:      SpO2: 96%  97% 96%  Weight:      Height:       General: Elderly. Hypersomnolent. No acute cardiopulmonary distress.  HEENT: Normocephalic atraumatic.  Right and left ears normal in appearance.  Pupils equal, round, reactive to light. Extraocular muscles are intact. Sclerae anicteric and noninjected.   Moist mucosal membranes. No mucosal lesions.  Neck: Neck supple without lymphadenopathy. No carotid bruits. No masses palpated.  Cardiovascular: Regular rate with normal S1-S2 sounds. No murmurs, rubs, gallops auscultated. No JVD.  Respiratory: Good respiratory effort with no wheezes, rales, rhonchi. Lungs clear to auscultation bilaterally.  No accessory muscle use. Abdomen: Soft, nontender, nondistended. Active bowel sounds. No masses or hepatosplenomegaly  Skin: No rashes, lesions, or ulcerations.  Dry, warm to touch. 2+ dorsalis pedis and radial pulses. Musculoskeletal: No major erythematous joints Psychiatric: Unable to determine Neurologic: Unable to determine due to patient unable to participate in exam  Data Reviewed: Results for orders placed or performed during the hospital encounter of 06/27/22 (from the past 24 hour(s))  Lactic acid, plasma     Status: None   Collection Time: 06/27/22  2:15 PM  Result Value Ref Range   Lactic Acid, Venous 1.9 0.5 - 1.9 mmol/L  Comprehensive metabolic panel     Status: Abnormal   Collection Time: 06/27/22  2:15 PM  Result Value Ref Range   Sodium 136 135 - 145 mmol/L   Potassium 4.2 3.5 - 5.1 mmol/L   Chloride 97 (L) 98 - 111 mmol/L   CO2 25 22 - 32 mmol/L   Glucose, Bld 225 (H) 70 - 99 mg/dL   BUN 50 (H) 8 - 23 mg/dL   Creatinine, Ser 2.73 (H) 0.44 - 1.00 mg/dL   Calcium 9.2 8.9 - 10.3 mg/dL   Total Protein 7.4 6.5 - 8.1 g/dL   Albumin 3.7 3.5 - 5.0 g/dL   AST 29 15 - 41 U/L   ALT 16 0 - 44 U/L   Alkaline Phosphatase 80 38 - 126 U/L   Total Bilirubin 0.6 0.3 -  1.2 mg/dL   GFR, Estimated 18 (L) >60 mL/min   Anion gap 14 5 - 15  CBC with Differential     Status: None   Collection Time: 06/27/22  2:15 PM  Result Value Ref Range   WBC 8.6 4.0 - 10.5 K/uL   RBC 4.82 3.87 - 5.11 MIL/uL   Hemoglobin 14.6 12.0 - 15.0 g/dL   HCT 44.9 36.0 - 46.0 %   MCV 93.2 80.0 - 100.0 fL   MCH 30.3 26.0 - 34.0 pg   MCHC 32.5 30.0 - 36.0 g/dL   RDW  13.0 11.5 - 15.5 %   Platelets 168 150 - 400 K/uL   nRBC 0.0 0.0 - 0.2 %   Neutrophils Relative % 53 %   Neutro Abs 4.6 1.7 - 7.7 K/uL   Lymphocytes Relative 36 %   Lymphs Abs 3.1 0.7 - 4.0 K/uL   Monocytes Relative 9 %   Monocytes Absolute 0.7 0.1 - 1.0 K/uL   Eosinophils Relative 1 %   Eosinophils Absolute 0.1 0.0 - 0.5 K/uL   Basophils Relative 1 %   Basophils Absolute 0.0 0.0 - 0.1 K/uL   Immature Granulocytes 0 %   Abs Immature Granulocytes 0.03 0.00 - 0.07 K/uL  Blood Culture (routine x 2)     Status: None (Preliminary result)   Collection Time: 06/27/22  2:15 PM   Specimen: BLOOD LEFT FOREARM  Result Value Ref Range   Specimen Description      BLOOD LEFT FOREARM BOTTLES DRAWN AEROBIC AND ANAEROBIC   Special Requests      Blood Culture adequate volume Performed at [facility], [address], [place], [place] [address]    Culture PENDING    Report Status PENDING   Blood Culture (routine x 2)     Status: None (Preliminary result)   Collection Time: 06/27/22  2:15 PM   Specimen: BLOOD RIGHT FOREARM  Result Value Ref Range   Specimen Description      BLOOD RIGHT FOREARM BOTTLES DRAWN AEROBIC AND ANAEROBIC   Special Requests      Blood Culture results may not be optimal due to an inadequate volume of blood received in culture bottles Performed at [facility], [address] [place], [place] [address]    Culture PENDING    Report Status PENDING   Troponin I (High Sensitivity)     Status: None   Collection Time: 06/27/22  2:15 PM  Result Value Ref Range   Troponin I (High Sensitivity) 15 <18 ng/L  I-stat chem 8, ed     Status: Abnormal   Collection Time: 06/27/22  2:16 PM  Result Value Ref Range   Sodium 136 135 - 145 mmol/L   Potassium 4.2 3.5 - 5.1 mmol/L   Chloride 101 98 - 111 mmol/L   BUN 45 (H) 8 - 23 mg/dL   Creatinine, Ser 3.00 (H) 0.44 - 1.00 mg/dL   Glucose, Bld 224 (H) 70 - 99 mg/dL   Calcium, Ion 1.01 (L) 1.15 - 1.40 mmol/L   TCO2 27 22 - 32 mmol/L    Hemoglobin 15.3 (H) 12.0 - 15.0 g/dL   HCT 45.0 36.0 - 46.0 %  Resp panel by RT-PCR (RSV, Flu A&B, Covid) Anterior Nasal Swab     Status: None   Collection Time: 06/27/22  2:34 PM   Specimen: Anterior Nasal Swab  Result Value Ref Range   SARS Coronavirus 2 by RT PCR NEGATIVE NEGATIVE   Influenza A by PCR NEGATIVE  NEGATIVE   Influenza B by PCR NEGATIVE NEGATIVE   Resp Syncytial Virus by PCR NEGATIVE NEGATIVE  Protime-INR     Status: Abnormal   Collection Time: 06/27/22  2:34 PM  Result Value Ref Range   Prothrombin Time 20.2 (H) 11.4 - 15.2 seconds   INR 1.7 (H) 0.8 - 1.2  APTT     Status: None   Collection Time: 06/27/22  2:34 PM  Result Value Ref Range   aPTT 29 24 - 36 seconds  Urinalysis, w/ Reflex to Culture (Infection Suspected) -Urine, Clean Catch     Status: Abnormal   Collection Time: 06/27/22  3:32 PM  Result Value Ref Range   Specimen Source URINE, CLEAN CATCH    Color, Urine YELLOW YELLOW   APPearance CLEAR CLEAR   Specific Gravity, Urine 1.017 1.005 - 1.030   pH 5.0 5.0 - 8.0   Glucose, UA NEGATIVE NEGATIVE mg/dL   Hgb urine dipstick NEGATIVE NEGATIVE   Bilirubin Urine NEGATIVE NEGATIVE   Ketones, ur NEGATIVE NEGATIVE mg/dL   Protein, ur 30 (A) NEGATIVE mg/dL   Nitrite NEGATIVE NEGATIVE   Leukocytes,Ua NEGATIVE NEGATIVE   RBC / HPF 0-5 0 - 5 RBC/hpf   WBC, UA 0-5 0 - 5 WBC/hpf   Bacteria, UA RARE (A) NONE SEEN   Squamous Epithelial / HPF 0-5 0 - 5 /HPF  Lactic acid, plasma     Status: None   Collection Time: 06/27/22  4:51 PM  Result Value Ref Range   Lactic Acid, Venous 1.7 0.5 - 1.9 mmol/L  Troponin I (High Sensitivity)     Status: None   Collection Time: 06/27/22  4:51 PM  Result Value Ref Range   Troponin I (High Sensitivity) 14 <18 ng/L  Blood gas, arterial     Status: Abnormal   Collection Time: 06/27/22  4:55 PM  Result Value Ref Range   FIO2 28.0 %   pH, Arterial 7.34 (L) 7.35 - 7.45   pCO2 arterial 56 (H) 32 - 48 mmHg   pO2, Arterial 92 83 -  108 mmHg   Bicarbonate 30.2 (H) 20.0 - 28.0 mmol/L   Acid-Base Excess 3.1 (H) 0.0 - 2.0 mmol/L   O2 Saturation 98 %   Patient temperature 37.0    Collection site RIGHT RADIAL    Drawn by [id number]    Allens test (pass/fail) PASS PASS   *Note: Due to a large number of results and/or encounters for the requested time period, some results have not been displayed. A complete set of results can be found in Results Review.   DG Chest Port 1 View  Result Date: 06/27/2022 CLINICAL DATA:  Catheter placement. EXAM: PORTABLE CHEST 1 VIEW COMPARISON:  Same day. FINDINGS: Stable cardiomediastinal silhouette with mild central pulmonary vascular congestion. Status post right shoulder arthroplasty. There is interval placement of right internal jugular catheter with distal tip directed into the right subclavian vein. No pneumothorax is noted. IMPRESSION: Interval placement of right internal jugular catheter with distal tip directed into the right subclavian vein; repositioning is recommended. Electronically Signed   By: Marijo Conception M.D.   On: 06/27/2022 15:32   DG Chest Port 1 View  Result Date: 06/27/2022 CLINICAL DATA:  Questionable sepsis - evaluate for abnormality EXAM: PORTABLE CHEST - 1 VIEW COMPARISON:  06/18/2022 FINDINGS: Cardiac silhouette is prominent. There is pulmonary interstitial prominence with vascular congestion. No focal consolidation. No pneumothorax or pleural effusion identified. Aorta is calcified. IMPRESSION: Findings suggest CHF. Electronically Signed  By: Sammie Bench M.D.   On: 06/27/2022 15:01     Assessment and Plan: No notes have been filed under this hospital service. Service: Hospitalist  Principal Problem:   Shock [facility]) Active Problems:   Chronic diastolic heart failure ([place])   Long term current use of anticoagulant   Chronic back pain   Chronic atrial fibrillation ([facility])   Essential hypertension   Diabetic neuropathy associated with type 2 diabetes mellitus  ([place])   Acute metabolic encephalopathy  Shock -hypovolemic versus septic. Admit to ICU Wean pressors as able Hold antihypertensives Check lactic acid and procalcitonin Continue antibiotics Blood cultures obtained in the emergency department AKI Continue IV fluids Recheck creatinine in the morning Hypersomnolence Sedating medications Chronic diastolic heart failure Chest x-ray shows heart failure, although patient not extremely hypoxic.  Will watch volume status very closely.  If still hypotensive and oxygen saturations drop, may need BiPAP Chronic atrial fibrillation Rate controlled at this moment. Will place on heparin drip for the moment Hypertension Hold antihypertensives DM2 Continue lantus SSI with CBGs q4hr   Advance Care Planning:   Code Status: Full Code confirmed by patient's spouse  Consults: Appreciate PCCM input  Family Communication: Patient's spouse  Severity of Illness: The appropriate patient status for this patient is INPATIENT. Inpatient status is judged to be reasonable and necessary in order to provide the required intensity of service to ensure the patient's safety. The patient's presenting symptoms, physical exam findings, and initial radiographic and laboratory data in the context of their chronic comorbidities is felt to place them at high risk for further clinical deterioration. Furthermore, it is not anticipated that the patient will be medically stable for discharge from the hospital within 2 midnights of admission.   * I certify that at the point of admission it is my clinical judgment that the patient will require inpatient hospital care spanning beyond 2 midnights from the point of admission due to high intensity of service, high risk for further deterioration and high frequency of surveillance required.*  Author: Truett Mainland, DO 06/27/2022 5:45 PM  For on call review [email or web address].

## 2022-06-27 NOTE — ED Notes (Signed)
Called [facility] for Critical care

## 2022-06-27 NOTE — Consult Note (Signed)
NAME:  Amber Stephenson, MRN:  [id number], DOB:  July 06, 1945, LOS: 0 ADMISSION DATE:  06/27/2022, CONSULTATION DATE:  06/27/2022  REFERRING MD:  Skip Mayer, EDP, CHIEF COMPLAINT: Hypotension, altered mental status  History of Present Illness:  77 year old never smoker with HFpEF and bipolar disorder who is being admitted via ED for hypotension and altered mental status. History obtained from chart review and speaking to husband, LK W last night, this morning was difficult to wake up, finally arousable around 11:30 AM but somnolent, blood pressure 80/40 but has been, initial blood pressure by EMS 60/40 ED labs showed no leukocytosis, BUN/creatinine 45/3.0, hemoglobin 15, lactate 1.9 EKG showed atrial fibrillation Chest x-ray showed pulm vascular congestion. Central line was placed in the right IJ, started on Levophed with improvement in blood pressure, 1 L IV fluids given. Chest x-ray shows CVL in right subclavian vein  She was hospitalized 1/71/15 at [facility] for hypoxia attributed to diastolic heart failure, was taking Lasix every other day, was noted to desaturate on walking on discharge and provided oxygen at home.  Discharged on 40 mg of Lasix twice daily. On subsequent cardiology visit 1/24 noted to have decreased Lasix to 20 mg twice daily and this was increased again to 40 mg twice daily  She is on chronic Dilaudid, this was not available and very recently switched to morphine long-acting by her pain/rehab physician   Pertinent  Medical History   HFpEF,  HTN,  permanent AF on Eliquis,  Type 2DM,  nonobstructive CAD by cath in 2019,  CKD lll,  OSA in tolerant to CPAP,  asthma   bipolar disorder.   Chronic pain on narcotics  Significant Hospital Events: Including procedures, antibiotic start and stop dates in addition to other pertinent events     Interim History / Subjective:  Examined in ED stretcher On 5 mics of Levophed Minimal urine in Foley  Objective   Blood  pressure 138/74, pulse 69, temperature 97.6 F (36.4 C), temperature source Oral, resp. rate 18, height '5\' 2"'$  (1.575 m), weight 86.2 kg, SpO2 96 %.        Intake/Output Summary (Last 24 hours) at 06/27/2022 1636 Last data filed at 06/27/2022 1618 Gross per 24 hour  Intake 10.64 ml  Output --  Net 10.64 ml   Filed Weights   06/27/22 1355  Weight: 86.2 kg    Examination: General: Elderly woman, somnolent, no distress lying supine HENT: No pallor, icterus, no JVD Lungs: Clear breath sounds, no accessory muscle use Cardiovascular: S1-S2 distant, irregular Abdomen: Soft, nontender, no guarding Extremities: No edema, no deformity Neuro: Somnolent, wakes up and is able to tell me her name, nonfocal, no asterixis GU: Clear minimal urine in Foley  BNP 307  Resolved Hospital Problem list     Assessment & Plan:  Hypotension with AKI and altered mental status. While sepsis is certainly possible, she does not have leukocytosis or fever, chest x-ray does not show any infiltrate and urine appears clear, UA pending. It is more likely that hypotension and AKI related to increased dose of Lasix 2 days ago in the setting of lisinopril , with altered mental status due to other sedating medications on board including narcotics-also note that recent change of formulation from Dilaudid to long-acting morphine  Hypotension -gentle fluids Appears to have responded to Levophed, on 5 mics and being tapered. Normal lactate is reassuring Empiric antibiotics while awaiting culture data  AKI -gentle fluids, hold Lasix and lisinopril. Trend BMET  Acute encephalopathy -she  is nonfocal, if worsens can obtain head CT Obtain ABG to ensure no hypercarbia, can use biPAP if hypercarbic, she is able to maintain airway Will avoid Norco and since she is on chronic narcotics Hold other sedating medicines such as Trileptal, Abilify etc.  HFpEF -hold metoprolol, lisinopril Atrial fibrillation -appears rate  controlled, hold apixaban until renal function improves  Can be admitted to [facility], ICU under hospitalist. If worsens please contact [phone] critical care transferred to [facility] (right click and "Reselect all SmartList Selections" daily)   Diet/type: NPO DVT prophylaxis: DOAC GI prophylaxis: N/A Lines: Central line and yes and it is still needed right IJ CVL is in subclavian but can be used for Levophed Foley:  Yes, and it is still needed Code Status:  full code Last date of multidisciplinary goals of care discussion [updated husband at bedside]  Labs   CBC: Recent Labs  Lab 06/27/22 1415 06/27/22 1416  WBC 8.6  --   NEUTROABS 4.6  --   HGB 14.6 15.3*  HCT 44.9 45.0  MCV 93.2  --   PLT 168  --     Basic Metabolic Panel: Recent Labs  Lab 06/23/22 1549 06/27/22 1415 06/27/22 1416  NA 139 136 136  K 5.0 4.2 4.2  CL 99 97* 101  CO2 30 25  --   GLUCOSE 173* 225* 224*  BUN 25* 50* 45*  CREATININE 0.97 2.73* 3.00*  CALCIUM 9.9 9.2  --    GFR: Estimated Creatinine Clearance: 16.5 mL/min (A) (by C-G formula based on SCr of 3 mg/dL (H)). Recent Labs  Lab 06/27/22 1415  WBC 8.6  LATICACIDVEN 1.9    Liver Function Tests: Recent Labs  Lab 06/27/22 1415  AST 29  ALT 16  ALKPHOS 80  BILITOT 0.6  PROT 7.4  ALBUMIN 3.7   No results for input(s): "LIPASE", "AMYLASE" in the last 168 hours. No results for input(s): "AMMONIA" in the last 168 hours.  ABG    Component Value Date/Time   PHART 7.389 06/08/2022 0851   PCO2ART 35.7 06/08/2022 0851   PO2ART 56 (L) 06/08/2022 0851   HCO3 21.6 06/08/2022 0851   TCO2 27 06/27/2022 1416   ACIDBASEDEF 3.0 (H) 06/08/2022 0851   O2SAT 89 06/08/2022 0851     Coagulation Profile: Recent Labs  Lab 06/27/22 1434  INR 1.7*    Cardiac Enzymes: No results for input(s): "CKTOTAL", "CKMB", "CKMBINDEX", "TROPONINI" in the last 168 hours.  HbA1C: Hemoglobin A1C  Date/Time Value Ref Range Status   05/28/2022 01:51 PM 6.8 (A) 4.0 - 5.6 % Final  11/22/2021 01:45 PM 7.3 (A) 4.0 - 5.6 % Final   HB A1C (BAYER DCA - WAIVED)  Date/Time Value Ref Range Status  04/03/2021 02:39 PM 6.7 (H) 4.8 - 5.6 % Final    Comment:             Prediabetes: 5.7 - 6.4          Diabetes: >6.4          Glycemic control for adults with diabetes: <7.0   11/14/2020 03:54 PM 6.8 <7.0 % Final    Comment:                                          Diabetic Adult            <7.0  Healthy Adult        4.3 - 5.7                                                           (DCCT/NGSP) American Diabetes Association's Summary of Glycemic Recommendations for Adults with Diabetes: Hemoglobin A1c <7.0%. More stringent glycemic goals (A1c <6.0%) may further reduce complications at the cost of increased risk of hypoglycemia.    Hgb A1c MFr Bld  Date/Time Value Ref Range Status  12/13/2020 05:35 PM 6.8 (H) 4.8 - 5.6 % Final    Comment:    (NOTE) Pre diabetes:          5.7%-6.4%  Diabetes:              >6.4%  Glycemic control for   <7.0% adults with diabetes     CBG: No results for input(s): "GLUCAP" in the last 168 hours.  Review of Systems:   Unable to obtain since somnolent  Past Medical History:  She,  has a past medical history of Acute exacerbation of CHF (congestive heart failure) ([place]) (12/13/2020), Acute on chronic diastolic CHF (congestive heart failure)/HFpEF Exacerbation (12/16/2020), Acute on chronic respiratory failure with hypoxia ([place]) (01/22/2019), Acute respiratory failure with hypoxia ([place]) (12/16/2020), Asthma, Atrial fibrillation with RVR (05/19/2017), Bipolar I disorder (01/23/2015), CAD (coronary artery disease), Cardiomegaly (01/12/2018), Chronic anticoagulation (05/30/2015), Chronic atrial fibrillation ([place]) (01/04/2015), Chronic back pain, Chronic diastolic CHF (congestive heart failure) (05/30/2015), Diabetes mellitus, type 2, without complication,  Diabetic neuropathy (02/06/2016), Dysrhythmia, Essential hypertension, Herpes genitalis in women (07/16/2015), Hyperlipidemia, Hypoxia (02/01/2019), Insomnia (01/23/2015), Lipoma (02/08/2015), Major depressive disorder, Migraine headache with aura (02/12/2016), 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04/21/2019), NAFL (nonalcoholic fatty liver) (56/43/3295), OSA (obstructive sleep apnea) (02/24/2019), Pulmonary hypertension, Renal insufficiency, and RLS (restless legs syndrome) (04/27/2015).   Surgical History:   Past Surgical History:  Procedure Laterality Date   BREAST REDUCTION SURGERY     EYE SURGERY Right    cateracts   HAMMER TOE SURGERY     LEFT HEART CATH AND CORONARY ANGIOGRAPHY N/A 02/03/2018   Procedure: LEFT HEART CATH AND CORONARY ANGIOGRAPHY;  Surgeon: Martinique, Peter M, MD;  Location: [facility];  Service: Cardiovascular;  Laterality: N/A;   REVERSE SHOULDER ARTHROPLASTY Right 08/19/2019   Procedure: REVERSE SHOULDER ARTHROPLASTY;  Surgeon: Netta Cedars, MD;  Location: [facility];  Service: Orthopedics;  Laterality: Right;  interscalene block   SHOULDER SURGERY Right    "I BROKE MY SHOUDLER   THIGH SURGERY     "TO REMOVE A TUMOR "     Social History:   reports that she has never smoked. She has never used smokeless tobacco. She reports that she does not currently use alcohol. She reports that she does not use drugs.   Family History:  Her family history includes Alcohol abuse in her sister; Alzheimer's disease in her father; Diabetes in her brother, mother, and sister; Heart disease in her brother, mother, and sister; Mental illness in her brother; Stroke in her brother.   Allergies Allergies  Allergen Reactions   Iodine Anaphylaxis   Ivp Dye [Iodinated Contrast Media] Anaphylaxis and Swelling    Throat closes    Latex Other (See Comments)    Latex tape pulls skin with it   Tizanidine Other (See Comments)    Weakness,  goofy, bad dreams     Home Medications  Prior  to Admission medications   Medication Sig Start Date End Date Taking? Authorizing Provider  Acetaminophen (TYLENOL PO) Take 2 tablets by mouth as needed (pain/headache).   Yes [provider]  albuterol (PROVENTIL) (2.5 MG/3ML) 0.083% nebulizer solution Take 3 mLs (2.5 mg total) by nebulization every 6 (six) hours as needed for wheezing or shortness of breath. Patient taking differently: Take 2.5 mg by nebulization as needed for wheezing or shortness of breath. 08/09/20  Yes Gerlene Fee, NP  albuterol (VENTOLIN HFA) 108 (90 Base) MCG/ACT inhaler Inhale 2 puffs into the lungs every 6 (six) hours as needed for wheezing or shortness of breath. Patient taking differently: Inhale 2 puffs into the lungs as needed for wheezing or shortness of breath. 08/09/20  Yes Gerlene Fee, NP  apixaban (ELIQUIS) 5 MG TABS tablet TAKE 1 TABLET(5 MG) BY MOUTH TWICE DAILY Patient taking differently: Take 5 mg by mouth 2 (two) times daily. 04/30/22  Yes Stacks, Cletus Gash, MD  ARIPiprazole (ABILIFY) 2 MG tablet Take 2 mg by mouth at bedtime. 10/07/21  Yes [provider]  atorvastatin (LIPITOR) 40 MG tablet TAKE 1 TABLET(40 MG) BY MOUTH DAILY Patient taking differently: Take 40 mg by mouth daily. 04/30/22  Yes [name], MD  benztropine (COGENTIN) 1 MG tablet Take 1 tablet (1 mg total) by mouth 2 (two) times daily. 12/11/21  Yes [name], MD  denosumab (PROLIA) 60 MG/ML SOSY injection Inject 60 mg into the skin every 6 (six) months. 08/14/20  Yes Stacks, Cletus Gash, MD  escitalopram (LEXAPRO) 10 MG tablet Take 10 mg by mouth daily. 10/13/21  Yes [provider]  Fluticasone-Salmeterol (ADVAIR DISKUS) 500-50 MCG/DOSE AEPB Inhale 1 puff into the lungs in the morning and at bedtime. Patient taking differently: Inhale 1 puff into the lungs as needed (SOB). 08/09/20  Yes Gerlene Fee, NP  furosemide (LASIX) 40 MG tablet Take 1 tablet (40 mg total) by mouth 2 (two) times daily. Patient taking  differently: Take 20 mg by mouth 2 (two) times daily. 06/13/22  Yes Sheikh, Omair Latif, DO  Galcanezumab-gnlm [facility]) 120 MG/ML SOAJ One injection per month Patient taking differently: Inject 120 mg into the skin every 30 (thirty) days. 05/19/22  Yes Rondel Jumbo, PA-C  hydrOXYzine (VISTARIL) 25 MG capsule Take 1 capsule (25 mg total) by mouth every 8 (eight) hours as needed. 03/31/22  Yes Hawks, Christy A, FNP  icosapent Ethyl (VASCEPA) 1 g capsule TAKE 2 CAPSULES TWICE A DAY WITH MEALS Patient taking differently: Take 2 g by mouth 2 (two) times daily. 05/28/22  Yes Stacks, Cletus Gash, MD  insulin glargine (LANTUS SOLOSTAR) 100 UNIT/ML Solostar Pen Inject 68 Units into the skin daily. Patient taking differently: Inject 34 Units into the skin 2 (two) times daily. 06/09/22  Yes Shamleffer, Melanie Crazier, MD  insulin lispro (HUMALOG KWIKPEN) 100 UNIT/ML KwikPen Max daily 50 units Patient taking differently: Inject 4-8 Units into the skin 3 (three) times daily. 05/28/22  Yes Shamleffer, Melanie Crazier, MD  lisinopril (ZESTRIL) 10 MG tablet Take 1 tablet (10 mg total) by mouth daily. 01/14/22  Yes Stacks, Cletus Gash, MD  LORazepam (ATIVAN) 1 MG tablet Take 1 tablet (1 mg total) by mouth 2 (two) times daily. And give 2 tablets by mouth at bedtime Patient taking differently: Take 1-2 mg by mouth 2 (two) times daily. Take 2 mg by mouth in the morning and 1 mg by mouth at bedtime 08/09/20  Yes Gerlene Fee, NP  metFORMIN (GLUCOPHAGE) 500 MG tablet Take 1 tablet (500 mg total) by mouth 2 (two) times daily with a meal. TAKE 1 TABLET(500 MG) BY MOUTH TWICE DAILY AFTER A MEAL Patient taking differently: Take 500 mg by mouth 2 (two) times daily with a meal. 05/28/22  Yes Shamleffer, Melanie Crazier, MD  metoprolol succinate (TOPROL XL) 25 MG 24 hr tablet Take 1 tablet (25 mg total) by mouth in the morning and at bedtime. Patient taking differently: Take 25 mg by mouth in the morning and at bedtime. Take with 100  mg tablet twice daily 11/13/21  Yes Turner, Eber Hong, MD  metoprolol succinate (TOPROL-XL) 100 MG 24 hr tablet TAKE 1 TABLET IN THE MORNING AND AT BEDTIME Patient taking differently: Take 100 mg by mouth in the morning and at bedtime. Take with 25 mg tablet twice daily. 11/13/21  Yes Turner, Eber Hong, MD  morphine (MS CONTIN) 15 MG 12 hr tablet Take 1 tablet (15 mg total) by mouth every 12 (twelve) hours. This is a long acting form, so take 1 tab 2x/day to keep pain more even- it is NOT a short acting pain med like you were taking- for chronic pain 06/20/22  Yes Lovorn, Jinny Blossom, MD  nystatin (MYCOSTATIN/NYSTOP) powder Apply 1 Application topically as needed (rash). 06/16/22  Yes Shamleffer, Melanie Crazier, MD  nystatin ointment (MYCOSTATIN) Apply 1 Application topically 2 (two) times daily. Patient taking differently: Apply 1 Application topically as needed (rash). 12/27/21  Yes Estill Dooms, NP  ondansetron (ZOFRAN-ODT) 8 MG disintegrating tablet Take 1 tablet (8 mg total) by mouth every 6 (six) hours as needed for nausea or vomiting. 01/13/22  Yes [name], MD  OXcarbazepine (TRILEPTAL) 150 MG tablet Take 1 tablet (150 mg total) by mouth 2 (two) times daily. 06/13/22  Yes Sheikh, Omair Latif, DO  potassium chloride SA (KLOR-CON M) 20 MEQ tablet Take 1 tablet (20 mEq total) by mouth daily. Patient taking differently: Take 10 mEq by mouth at bedtime. 01/27/22  Yes Stacks, Cletus Gash, MD  pregabalin (LYRICA) 200 MG capsule TAKE 1 CAPSULE(200 MG) BY MOUTH TWICE DAILY Patient taking differently: Take 200 mg by mouth 2 (two) times daily. 03/27/22  Yes Lovorn, Jinny Blossom, MD  senna-docusate (SENOKOT-S) 8.6-50 MG tablet Take 1 tablet by mouth at bedtime. 06/13/22  Yes Sheikh, Omair Latif, DO  tirzepatide [facility]) 5 MG/0.5ML Pen Inject 5 mg into the skin once a week.   Yes [provider]  Vitamin D, Cholecalciferol, 25 MCG (1000 UT) TABS Take 1,000 Units by mouth daily in the afternoon. 07/26/20  Yes  [provider]  Continuous Blood Gluc Sensor (DEXCOM G7 SENSOR) MISC Change sensor every 10 days 05/20/22   Shamleffer, Melanie Crazier, MD  Continuous Blood Gluc Transmit (DEXCOM G6 TRANSMITTER) MISC Use as instructed to check blood sugar. Change every 90 days 05/12/22   Shamleffer, Melanie Crazier, MD  fluconazole (DIFLUCAN) 150 MG tablet Take 1 now and can repeat 1 in 3 days Patient not taking: Reported on 06/27/2022 06/19/22   Derrek Monaco A, NP  Insulin Pen Needle 29G X 12MM MISC 1 Device by Does not apply route daily in the afternoon. 05/28/22   Shamleffer, Melanie Crazier, MD  naloxone [facility]) nasal spray 4 mg/0.1 mL Place 1 spray into the nose once. 08/08/21   [provider]     Critical care time: [place] MD. FCCP. [employer] [facility] Pager :  [phone]  If no response to pager , please call [phone] until 7 pm After 7:00 pm call Elink  [phone]   06/27/2022

## 2022-06-27 NOTE — ED Provider Notes (Signed)
[facility] Provider Note   CSN: [id number] Arrival date & time: 06/27/22  1347     History  Chief Complaint  Patient presents with   Dizziness   Hypotension    Amber Stephenson is a 77 y.o. female.   Dizziness  This patient is a 77 year old female, she is on Eliquis, she is on Abilify and Cogentin, she also has a history of edema and takes Lasix, she is a diabetic taking insulin and high blood pressure taking metoprolol.  She presents to the hospital today with a complaint of generalized weakness, hypotension, gradually getting worse.  The patient is known to have A-fib and had been complaining of a couple of days of worsening weakness, dry mouth, no appetite.  The patient is always on 2 L by nasal cannula.  She had a very hard time getting her up and walking today so they called the paramedics and found her to be hypotensive with a blood pressure of about 80/60.  She denies fevers but states she was recently treated for some type of vaginal yeast infection, she has been coughing for couple of days    Home Medications Prior to Admission medications   Medication Sig Start Date End Date Taking? Authorizing Provider  Acetaminophen (TYLENOL PO) Take 2 tablets by mouth as needed (pain/headache).    [provider]  albuterol (PROVENTIL) (2.5 MG/3ML) 0.083% nebulizer solution Take 3 mLs (2.5 mg total) by nebulization every 6 (six) hours as needed for wheezing or shortness of breath. Patient taking differently: Take 2.5 mg by nebulization as needed for wheezing or shortness of breath. 08/09/20   Gerlene Fee, NP  albuterol (VENTOLIN HFA) 108 (90 Base) MCG/ACT inhaler Inhale 2 puffs into the lungs every 6 (six) hours as needed for wheezing or shortness of breath. Patient taking differently: Inhale 2 puffs into the lungs as needed for wheezing or shortness of breath. 08/09/20   Gerlene Fee, NP  apixaban (ELIQUIS) 5 MG TABS tablet  TAKE 1 TABLET(5 MG) BY MOUTH TWICE DAILY Patient taking differently: Take 5 mg by mouth 2 (two) times daily. 04/30/22   [name], MD  ARIPiprazole (ABILIFY) 2 MG tablet Take 2 mg by mouth at bedtime. 10/07/21   [provider]  atorvastatin (LIPITOR) 40 MG tablet TAKE 1 TABLET(40 MG) BY MOUTH DAILY Patient taking differently: Take 40 mg by mouth daily. 04/30/22   [name], MD  benztropine (COGENTIN) 1 MG tablet Take 1 tablet (1 mg total) by mouth 2 (two) times daily. 12/11/21   [name], MD  Continuous Blood Gluc Sensor (DEXCOM G7 SENSOR) MISC Change sensor every 10 days 05/20/22   Shamleffer, Melanie Crazier, MD  Continuous Blood Gluc Transmit (DEXCOM G6 TRANSMITTER) MISC Use as instructed to check blood sugar. Change every 90 days 05/12/22   Shamleffer, Melanie Crazier, MD  denosumab (PROLIA) 60 MG/ML SOSY injection Inject 60 mg into the skin every 6 (six) months. 08/14/20   [name], MD  escitalopram (LEXAPRO) 10 MG tablet Take 10 mg by mouth daily. 10/13/21   [provider]  fluconazole (DIFLUCAN) 150 MG tablet Take 1 now and can repeat 1 in 3 days 06/19/22   Estill Dooms, NP  Fluticasone-Salmeterol (ADVAIR DISKUS) 500-50 MCG/DOSE AEPB Inhale 1 puff into the lungs in the morning and at bedtime. Patient taking differently: Inhale 1 puff into the lungs as needed (SOB). 08/09/20   Gerlene Fee, NP  furosemide (LASIX) 40 MG  tablet Take 1 tablet (40 mg total) by mouth 2 (two) times daily. Patient taking differently: Take 20 mg by mouth 2 (two) times daily. 06/13/22   Sheikh, Omair Latif, DO  Galcanezumab-gnlm [facility]) 120 MG/ML SOAJ One injection per month Patient taking differently: Inject 120 mg into the skin every 30 (thirty) days. 05/19/22   Rondel Jumbo, PA-C  hydrOXYzine (VISTARIL) 25 MG capsule Take 1 capsule (25 mg total) by mouth every 8 (eight) hours as needed. 03/31/22   Hawks, Theador Hawthorne, FNP  icosapent Ethyl (VASCEPA) 1 g capsule TAKE 2  CAPSULES TWICE A DAY WITH MEALS Patient taking differently: Take 2 g by mouth 2 (two) times daily. 05/28/22   [name], MD  insulin glargine (LANTUS SOLOSTAR) 100 UNIT/ML Solostar Pen Inject 68 Units into the skin daily. Patient taking differently: Inject 34 Units into the skin 2 (two) times daily. 06/09/22   Shamleffer, Melanie Crazier, MD  insulin lispro (HUMALOG KWIKPEN) 100 UNIT/ML KwikPen Max daily 50 units Patient taking differently: Inject 4-8 Units into the skin 3 (three) times daily. 05/28/22   Shamleffer, Melanie Crazier, MD  Insulin Pen Needle 29G X 12MM MISC 1 Device by Does not apply route daily in the afternoon. 05/28/22   Shamleffer, Melanie Crazier, MD  lisinopril (ZESTRIL) 10 MG tablet Take 1 tablet (10 mg total) by mouth daily. 01/14/22   [name], MD  LORazepam (ATIVAN) 1 MG tablet Take 1 tablet (1 mg total) by mouth 2 (two) times daily. And give 2 tablets by mouth at bedtime Patient taking differently: Take 1-2 mg by mouth 2 (two) times daily. Take 2 mg by mouth in the morning and 1 mg by mouth at bedtime 08/09/20   Gerlene Fee, NP  metFORMIN (GLUCOPHAGE) 500 MG tablet Take 1 tablet (500 mg total) by mouth 2 (two) times daily with a meal. TAKE 1 TABLET(500 MG) BY MOUTH TWICE DAILY AFTER A MEAL Patient taking differently: Take 500 mg by mouth 2 (two) times daily with a meal. 05/28/22   Shamleffer, Melanie Crazier, MD  metoprolol succinate (TOPROL XL) 25 MG 24 hr tablet Take 1 tablet (25 mg total) by mouth in the morning and at bedtime. Patient taking differently: Take 25 mg by mouth in the morning and at bedtime. Take with 100 mg tablet twice daily 11/13/21   Sueanne Margarita, MD  metoprolol succinate (TOPROL-XL) 100 MG 24 hr tablet TAKE 1 TABLET IN THE MORNING AND AT BEDTIME Patient taking differently: Take 100 mg by mouth in the morning and at bedtime. Take with 25 mg tablet twice daily. 11/13/21   Sueanne Margarita, MD  morphine (MS CONTIN) 15 MG 12 hr tablet Take 1  tablet (15 mg total) by mouth every 12 (twelve) hours. This is a long acting form, so take 1 tab 2x/day to keep pain more even- it is NOT a short acting pain med like you were taking- for chronic pain 06/20/22   Lovorn, Jinny Blossom, MD  naloxone [facility]) nasal spray 4 mg/0.1 mL  08/08/21   [provider]  nystatin (MYCOSTATIN/NYSTOP) powder Apply 1 Application topically as needed (rash). 06/16/22   Shamleffer, Melanie Crazier, MD  nystatin ointment (MYCOSTATIN) Apply 1 Application topically 2 (two) times daily. Patient taking differently: Apply 1 Application topically as needed (rash). 12/27/21   Estill Dooms, NP  ondansetron (ZOFRAN-ODT) 8 MG disintegrating tablet Take 1 tablet (8 mg total) by mouth every 6 (six) hours as needed for nausea or vomiting. 01/13/22   Stacks,  Cletus Gash, MD  OXcarbazepine (TRILEPTAL) 150 MG tablet Take 1 tablet (150 mg total) by mouth 2 (two) times daily. 06/13/22   Raiford Noble Latif, DO  potassium chloride SA (KLOR-CON M) 20 MEQ tablet Take 1 tablet (20 mEq total) by mouth daily. Patient taking differently: Take 10 mEq by mouth at bedtime. 01/27/22   [name], MD  pregabalin (LYRICA) 200 MG capsule TAKE 1 CAPSULE(200 MG) BY MOUTH TWICE DAILY Patient taking differently: Take 200 mg by mouth 2 (two) times daily. 03/27/22   Lovorn, Jinny Blossom, MD  senna-docusate (SENOKOT-S) 8.6-50 MG tablet Take 1 tablet by mouth at bedtime. 06/13/22   Sheikh, Omair Latif, DO  tirzepatide [facility]) 5 MG/0.5ML Pen Inject 5 mg into the skin once a week.    [provider]  Vitamin D, Cholecalciferol, 25 MCG (1000 UT) TABS Take 1,000 Units by mouth daily in the afternoon. 07/26/20   [provider]      Allergies    Iodine, Ivp dye [iodinated contrast media], Latex, and Tizanidine    Review of Systems   Review of Systems  Neurological:  Positive for dizziness.  All other systems reviewed and are negative.   Physical Exam Updated Vital Signs BP 91/61   Pulse 74    Temp 97.6 F (36.4 C) (Oral)   Resp 17   Ht 1.575 m ('5\' 2"'$ )   Wt 86.2 kg   SpO2 98%   BMI 34.75 kg/m  Physical Exam Vitals and nursing note reviewed.  Constitutional:      General: She is in acute distress.     Appearance: She is well-developed. She is ill-appearing and toxic-appearing.     Comments: Ill-appearing  HENT:     Head: Normocephalic and atraumatic.     Mouth/Throat:     Mouth: Mucous membranes are dry.     Pharynx: No oropharyngeal exudate.  Eyes:     General: No scleral icterus.       Right eye: No discharge.        Left eye: No discharge.     Conjunctiva/sclera: Conjunctivae normal.     Pupils: Pupils are equal, round, and reactive to light.  Neck:     Thyroid: No thyromegaly.     Vascular: No JVD.  Cardiovascular:     Rate and Rhythm: Normal rate and regular rhythm.     Heart sounds: Normal heart sounds. No murmur heard.    No friction rub. No gallop.  Pulmonary:     Effort: Pulmonary effort is normal. No respiratory distress.     Breath sounds: Normal breath sounds. No wheezing or rales.  Abdominal:     General: Bowel sounds are normal. There is no distension.     Palpations: Abdomen is soft. There is no mass.     Tenderness: There is no abdominal tenderness.  Genitourinary:    Comments: Bilateral inguinal regions with some type of purple coloring Musculoskeletal:        General: No tenderness. Normal range of motion.     Cervical back: Normal range of motion and neck supple.     Right lower leg: No edema.     Left lower leg: No edema.  Lymphadenopathy:     Cervical: No cervical adenopathy.  Skin:    General: Skin is warm and dry.     Findings: No erythema or rash.  Neurological:     Coordination: Coordination normal.     Comments: The patient is able to make grips bilaterally, she opens  her eyes and answers questions but is slow to answer questions, she is generally weak, symmetrically weak  Psychiatric:        Behavior: Behavior normal.      ED Results / Procedures / Treatments   Labs (all labs ordered are listed, but only abnormal results are displayed) Labs Reviewed  COMPREHENSIVE METABOLIC PANEL - Abnormal; Notable for the following components:      Result Value   Chloride 97 (*)    Glucose, Bld 225 (*)    BUN 50 (*)    Creatinine, Ser 2.73 (*)    GFR, Estimated 18 (*)    All other components within normal limits  PROTIME-INR - Abnormal; Notable for the following components:   Prothrombin Time 20.2 (*)    INR 1.7 (*)    All other components within normal limits  I-STAT CHEM 8, ED - Abnormal; Notable for the following components:   BUN 45 (*)    Creatinine, Ser 3.00 (*)    Glucose, Bld 224 (*)    Calcium, Ion 1.01 (*)    Hemoglobin 15.3 (*)    All other components within normal limits  RESP PANEL BY RT-PCR (RSV, FLU A&B, COVID)  RVPGX2  CULTURE, BLOOD (ROUTINE X 2)  CULTURE, BLOOD (ROUTINE X 2)  LACTIC ACID, PLASMA  CBC WITH DIFFERENTIAL/PLATELET  APTT  LACTIC ACID, PLASMA  URINALYSIS, W/ REFLEX TO CULTURE (INFECTION SUSPECTED)  TROPONIN I (HIGH SENSITIVITY)  TROPONIN I (HIGH SENSITIVITY)    EKG EKG Interpretation  Date/Time:  Friday June 27 2022 13:57:38 EST Ventricular Rate:  79 PR Interval:    QRS Duration: 76 QT Interval:  418 QTC Calculation: 480 R Axis:   -17 Text Interpretation: Atrial fibrillation Borderline left axis deviation Abnormal R-wave progression, early transition Abnormal T, consider ischemia, lateral leads Minimal ST elevation, inferior leads Confirmed by Noemi Chapel [phone]) on 06/27/2022 2:43:03 PM  Radiology DG Chest Port 1 View  Result Date: 06/27/2022 CLINICAL DATA:  Catheter placement. EXAM: PORTABLE CHEST 1 VIEW COMPARISON:  Same day. FINDINGS: Stable cardiomediastinal silhouette with mild central pulmonary vascular congestion. Status post right shoulder arthroplasty. There is interval placement of right internal jugular catheter with distal tip directed into the  right subclavian vein. No pneumothorax is noted. IMPRESSION: Interval placement of right internal jugular catheter with distal tip directed into the right subclavian vein; repositioning is recommended. Electronically Signed   By: Marijo Conception M.D.   On: 06/27/2022 15:32   DG Chest Port 1 View  Result Date: 06/27/2022 CLINICAL DATA:  Questionable sepsis - evaluate for abnormality EXAM: PORTABLE CHEST - 1 VIEW COMPARISON:  06/18/2022 FINDINGS: Cardiac silhouette is prominent. There is pulmonary interstitial prominence with vascular congestion. No focal consolidation. No pneumothorax or pleural effusion identified. Aorta is calcified. IMPRESSION: Findings suggest CHF. Electronically Signed   By: Sammie Bench M.D.   On: 06/27/2022 15:01    Procedures .Critical Care  Performed by: Noemi Chapel, MD Authorized by: Noemi Chapel, MD   Critical care provider statement:    Critical care time (minutes):  45   Critical care time was exclusive of:  Separately billable procedures and treating other patients   Critical care was necessary to treat or prevent imminent or life-threatening deterioration of the following conditions:  Sepsis, dehydration and renal failure   Critical care was time spent personally by me on the following activities:  Development of treatment plan with patient or surrogate, discussions with consultants, evaluation of patient's response to treatment, examination  of patient, obtaining history from patient or surrogate, review of old charts, re-evaluation of patient's condition, pulse oximetry, ordering and review of radiographic studies, ordering and review of laboratory studies and ordering and performing treatments and interventions   I assumed direction of critical care for this patient from another provider in my specialty: no     Care discussed with: admitting provider   Comments:       .Central Line  Date/Time: 06/27/2022 3:23 PM  Performed by: Noemi Chapel,  MD Authorized by: Noemi Chapel, MD   Consent:    Consent obtained:  Written   Consent given by:  Patient and spouse   Risks, benefits, and alternatives were discussed: yes     Risks discussed:  Arterial puncture, incorrect placement, nerve damage, pneumothorax, infection and bleeding   Alternatives discussed:  Alternative treatment Universal protocol:    Procedure explained and questions answered to patient or proxy's satisfaction: yes     Relevant documents present and verified: yes     Test results available: yes     Imaging studies available: yes     Required blood products, implants, devices, and special equipment available: yes     Site/side marked: yes     Immediately prior to procedure, a time out was called: yes     Patient identity confirmed:  Verbally with patient and provided demographic data Pre-procedure details:    Indication(s): hemodynamic monitoring and insufficient peripheral access     Hand hygiene: Hand hygiene performed prior to insertion     Sterile barrier technique: All elements of maximal sterile technique followed     Skin preparation:  Chlorhexidine   Skin preparation agent: Skin preparation agent completely dried prior to procedure   Sedation:    Sedation type:  None Anesthesia:    Anesthesia method:  Local infiltration   Local anesthetic:  Lidocaine 1% w/o epi Procedure details:    Location:  R internal jugular   Patient position:  Trendelenburg   Procedural supplies:  Triple lumen   Catheter size:  7 Fr   Landmarks identified: yes     Ultrasound guidance: yes     Ultrasound guidance timing: prior to insertion     Sterile ultrasound techniques: Sterile gel and sterile probe covers were used     Number of attempts:  1   Successful placement: yes   Post-procedure details:    Post-procedure:  Dressing applied and line sutured   Assessment:  Blood return through all ports, no pneumothorax on x-ray, free fluid flow and placement verified by x-ray    Procedure completion:  Tolerated Comments:     Possibley in SCV     Medications Ordered in ED Medications  lactated ringers infusion (has no administration in time range)  cefTRIAXone (ROCEPHIN) 2 g in sodium chloride 0.9 % 100 mL IVPB (2 g Intravenous New Bag/Given 06/27/22 1425)  azithromycin (ZITHROMAX) 500 mg in sodium chloride 0.9 % 250 mL IVPB (500 mg Intravenous New Bag/Given 06/27/22 1429)  lactated ringers bolus 1,000 mL (1,000 mLs Intravenous New Bag/Given 06/27/22 1417)    And  lactated ringers bolus 1,000 mL (1,000 mLs Intravenous New Bag/Given 06/27/22 1425)    And  lactated ringers bolus 1,000 mL (has no administration in time range)  norepinephrine (LEVOPHED) '4mg'$  in 262m (0.016 mg/mL) premix infusion (2 mcg/min Intravenous New Bag/Given 06/27/22 1545)    ED Course/ Medical Decision Making/ A&P  Medical Decision Making Amount and/or Complexity of Data Reviewed Labs: ordered. Radiology: ordered. ECG/medicine tests: ordered.  Risk Prescription drug management.   This patient presents to the ED for concern of shock, this involves an extensive number of treatment options, and is a complaint that carries with it a high risk of complications and morbidity.  The differential diagnosis includes dehydration, renal failure, congestive heart failure, A-fib, sepsis, severe sepsis or septic shock   Co morbidities that complicate the patient evaluation  Diabetes hypertension A-fib, possible history of CHF   Additional history obtained:  Additional history obtained from electronic medical record External records from outside source obtained and reviewed including recent admission to the hospital January 2024, admitted with acute on chronic congestive heart failure, hypertension, diabetes, known to have bipolar disorder   Lab Tests:  I Ordered, and personally interpreted labs.  The pertinent results include: Normal CBC with no leukocytosis no  anemia, renal function is severely elevated with a creatinine of 3.0, BUN of 45, electrolytes are okay.  Troponin is 15   Imaging Studies ordered:  I ordered imaging studies including chest x-ray I independently visualized and interpreted imaging which showed pulmonary edema I agree with the radiologist interpretation   Cardiac Monitoring: / EKG:  The patient was maintained on a cardiac monitor.  I personally viewed and interpreted the cardiac monitored which showed an underlying rhythm of: Sinus rhythm, persistent hypotension   Consultations Obtained:  I requested consultation with the critical care physician, this consultation is pending at the time of change of shift, Dr. Verner Chol will follow-up this call and facilitate placement of this patient in the ICU   Problem List / ED Course / Critical interventions / Medication management  Severe hypotension requiring central line placement and aggressive fluid resuscitation, Levophed, antibiotics I ordered medication including Levophed for hypotension Reevaluation of the patient after these medicines showed that the patient maintained critical appearance I have reviewed the patients home medicines and have made adjustments as needed   Social Determinants of Health:  Congestive heart failure   Test / Admission - Considered:  Will need to be admitted to higher level of care         Final Clinical Impression(s) / ED Diagnoses Final diagnoses:  Shock ([place])  Acute renal failure, unspecified acute renal failure type ([place])  Acute congestive heart failure, unspecified heart failure type ([place])     Noemi Chapel, MD 06/27/22 1550

## 2022-06-27 NOTE — ED Notes (Signed)
X-ray at bedside to confirm central line placement.

## 2022-06-27 NOTE — ED Notes (Signed)
Both sets of cultures of obtained before antibiotics started.

## 2022-06-27 NOTE — ED Notes (Signed)
X-ray at bedside. Miller MD at bedside setting up for central line placement.

## 2022-06-27 NOTE — ED Provider Notes (Signed)
4:02 PM I discussed case with Dr. Elsworth Soho, he will see patient and help decide which ICU she should go to.  4:37 PM Dr. Elsworth Soho has seen patient. She can stay here in ICU under hospitalist service.  Blood pressure is coming up and the Levophed will be weaned down.  Was on 5 mcg but able to wean this down currently.  He also feels like her depressed mental status is more related to her morphine and AKI and while she does not need Narcan this should improve and can just be watched.  Will consult hospitalist.  5:11 PM Discussed with Dr. Nehemiah Settle.   [name] Gambler, MD 06/27/22 [phone]

## 2022-06-28 ENCOUNTER — Inpatient Hospital Stay (HOSPITAL_COMMUNITY): Payer: Medicare Other

## 2022-06-28 DIAGNOSIS — R579 Shock, unspecified: Secondary | ICD-10-CM

## 2022-06-28 LAB — BASIC METABOLIC PANEL
Anion gap: 10 (ref 5–15)
BUN: 40 mg/dL — ABNORMAL HIGH (ref 8–23)
CO2: 27 mmol/L (ref 22–32)
Calcium: 8.7 mg/dL — ABNORMAL LOW (ref 8.9–10.3)
Chloride: 102 mmol/L (ref 98–111)
Creatinine, Ser: 1.74 mg/dL — ABNORMAL HIGH (ref 0.44–1.00)
GFR, Estimated: 30 mL/min — ABNORMAL LOW (ref >60)
Glucose, Bld: 118 mg/dL — ABNORMAL HIGH (ref 70–99)
Potassium: 3.6 mmol/L (ref 3.5–5.1)
Sodium: 139 mmol/L (ref 135–145)

## 2022-06-28 LAB — PROCALCITONIN: Procalcitonin: <0.1 ng/mL

## 2022-06-28 LAB — HEPARIN LEVEL (UNFRACTIONATED)
Heparin Unfractionated: >1.1 IU/mL — ABNORMAL HIGH (ref 0.30–0.70)
Heparin Unfractionated: >1.1 IU/mL — ABNORMAL HIGH (ref 0.30–0.70)

## 2022-06-28 LAB — GLUCOSE, CAPILLARY
Glucose-Capillary: 124 mg/dL — ABNORMAL HIGH (ref 70–99)
Glucose-Capillary: 121 mg/dL — ABNORMAL HIGH (ref 70–99)
Glucose-Capillary: 222 mg/dL — ABNORMAL HIGH (ref 70–99)
Glucose-Capillary: 214 mg/dL — ABNORMAL HIGH (ref 70–99)
Glucose-Capillary: 286 mg/dL — ABNORMAL HIGH (ref 70–99)
Glucose-Capillary: 279 mg/dL — ABNORMAL HIGH (ref 70–99)

## 2022-06-28 LAB — CBC
HCT: 39.4 % (ref 36.0–46.0)
Hemoglobin: 12.9 g/dL (ref 12.0–15.0)
MCH: 30.1 pg (ref 26.0–34.0)
MCHC: 32.7 g/dL (ref 30.0–36.0)
MCV: 92.1 fL (ref 80.0–100.0)
Platelets: 169 10*3/uL (ref 150–400)
RBC: 4.28 MIL/uL (ref 3.87–5.11)
RDW: 12.7 % (ref 11.5–15.5)
WBC: 11.9 10*3/uL — ABNORMAL HIGH (ref 4.0–10.5)
nRBC: 0 % (ref 0.0–0.2)

## 2022-06-28 LAB — STREP PNEUMONIAE URINARY ANTIGEN: Strep Pneumo Urinary Antigen: NEGATIVE

## 2022-06-28 LAB — APTT
aPTT: 61 seconds — ABNORMAL HIGH (ref 24–36)
aPTT: 32 seconds (ref 24–36)

## 2022-06-28 LAB — PROTIME-INR
INR: 1.5 — ABNORMAL HIGH (ref 0.8–1.2)
Prothrombin Time: 17.8 seconds — ABNORMAL HIGH (ref 11.4–15.2)

## 2022-06-28 MED ORDER — "THROMBI-PAD 3""X3"" EX PADS"
3.0000 | MEDICATED_PAD | Freq: Once | CUTANEOUS | Status: AC
  Administered 2022-06-28: 3 via TOPICAL
  Filled 2022-06-28: qty 3

## 2022-06-28 MED ORDER — PROTAMINE SULFATE 10 MG/ML IV SOLN
50.0000 mg | Freq: Once | INTRAVENOUS | Status: DC
  Filled 2022-06-28: qty 5

## 2022-06-28 MED ORDER — OXCARBAZEPINE 150 MG PO TABS
150.0000 mg | ORAL_TABLET | Freq: Two times a day (BID) | ORAL | Status: DC
  Administered 2022-06-28 – 2022-06-29 (×3): 150 mg via ORAL
  Filled 2022-06-28 (×5): qty 1

## 2022-06-28 MED ORDER — ORAL CARE MOUTH RINSE: 15.0000 mL | OROMUCOSAL | Status: DC | PRN

## 2022-06-28 MED ORDER — HYDROMORPHONE HCL 1 MG/ML IJ SOLN
0.5000 mg | INTRAMUSCULAR | Status: DC | PRN
  Administered 2022-06-28 – 2022-06-29 (×4): 0.5 mg via INTRAVENOUS
  Filled 2022-06-28 (×4): qty 0.5

## 2022-06-28 MED ORDER — PREGABALIN 75 MG PO CAPS
200.0000 mg | ORAL_CAPSULE | Freq: Two times a day (BID) | ORAL | Status: DC
  Administered 2022-06-28 – 2022-06-29 (×3): 200 mg via ORAL
  Filled 2022-06-28 (×3): qty 2

## 2022-06-28 NOTE — Progress Notes (Signed)
eLink Physician-Brief Progress Note Patient Name: Amber Stephenson DOB: Jul 01, 1945 MRN: [id number]   Date of Service  06/28/2022  HPI/Events of Note  Patient continuing to bleed at site of IJ insertion, Heparin level from 2:47 resulted and noted to be outside parameters, Heparin gtt held and bedside RN asked to call the pharmacist who is monitoring the levels and making adjustments to discuss next steps. I also asked her to get the Hospitalist in-house to come and evaluate the patient at bedside. Hemoglobin 12.9 from 2:47 am lab draw.  eICU Interventions  See above.        Frederik Pear 06/28/2022, 6:48 AM

## 2022-06-28 NOTE — Progress Notes (Signed)
Notified Elink of saturated RIJ dressing that was changed around 0415. Nurse forwarded concern to MD on call, awaiting response/orders.

## 2022-06-28 NOTE — Progress Notes (Signed)
eLink Physician-Brief Progress Note Patient Name: Amber Stephenson DOB: 01/12/1946 MRN: [id number]   Date of Service  06/28/2022  HPI/Events of Note  Hemorrhage at the site of central line access, patient is on a Heparin gtt.  eICU Interventions  Thrombin pad + gauze pressure dressing ordered applied to the area.        Frederik Pear 06/28/2022, 3:39 AM

## 2022-06-28 NOTE — Progress Notes (Signed)
Contacted Elink MD about bleed from RIJ site. New dressing now saturated after 1.5 hours since the dressing change. Heparin held for now. Pharmacy contacted to clarify dosing orders. Hospitalist notified and requested to visualize the site. Will continue to monitor

## 2022-06-28 NOTE — Progress Notes (Signed)
Notified Elink nurse of bleeding from [place] site. Dr. Lucile Shutters notified. Awaiting response/orders at this time.

## 2022-06-28 NOTE — Progress Notes (Signed)
eLink Physician-Brief Progress Note Patient Name: Amber Stephenson DOB: Jul 20, 1945 MRN: [id number]   Date of Service  06/28/2022  HPI/Events of Note  Patient admitted with hypotension and altered mental status of as yet undetermined etiology, work up in progress.  eICU Interventions  New Patient Evaluation.        Kerry Kass Leighton Brickley 06/28/2022, 12:07 AM

## 2022-06-28 NOTE — Progress Notes (Addendum)
PROGRESS NOTE    Amber Stephenson  AYT:[id number] DOB: Nov 17, 1945 DOA: 06/27/2022 PCP: [name], MD   Brief Narrative:  Amber Stephenson is a 77 y.o. female with medical history significant of heart failure with preserved EF, atrial fibrillation on chronic anticoagulation, chronic back pain on opiate management, diabetes type 2, diabetic neuropathy, hypertension, OSA, bipolar, pulmonary hypertension, asthma.  Was brought to the hospital via EMS secondary to generalized weakness, hypotension, hypersomnolence.  She was also noted to be quite hypotensive and much of this appears to be related to her recent increase in Lasix dosage as well as change in home narcotic medications from Dilaudid to morphine long-acting.  She required right IJ central venous line placement and initiation of norepinephrine for blood pressure support as well as aggressive IV fluids.  Assessment & Plan:   Principal Problem:   Shock ([place]) Active Problems:   Chronic diastolic heart failure ([place])   Long term current use of anticoagulant   Chronic back pain   Chronic atrial fibrillation (HCC)   Essential hypertension   Diabetic neuropathy associated with type 2 diabetes mellitus ([place])   Acute metabolic encephalopathy   AKI (acute kidney injury) ([place])  Assessment and Plan:  Shock likely hypovolemic  Wean norepinephrine as tolerated Procalcitonin low and blood cultures with no growth to date Discontinue IV Rocephin and azithromycin Continue to monitor CBC  AKI--prerenal, improving Likely prerenal in the setting of recent diuretic increase Continue to monitor strict I's and O's and daily weights Hold nephrotoxic agents Monitor a.m. labs  Hypersomnolence-resolved Okay to slowly resume chronic back pain medications  Chronic diastolic heart failure with chronic hypoxemia Plan to wean off IV fluid once norepinephrine has been discontinued Typically wears 2 L nasal cannula at home  Chronic atrial  fibrillation Started on heparin drip which has been held due to bleeding at right IJ site Currently rate controlled  Hypertension Hold antihypertensives until weaned off of norepinephrine  Type 2 diabetes with hyperglycemia Continue Lantus and SSI with CBGs every 4 hours Started carb modified diet  Obesity AMI 35.73  DVT prophylaxis: Heparin drip discontinued Code Status: Full Family Communication: Discussed with husband on phone 1/27 Disposition Plan:  Status is: Inpatient Remains inpatient appropriate because: Need for IV fluids and medications   Consultants:  PCCM  Procedures:  Right IJ placement 1/26  Antimicrobials:  Anti-infectives (From admission, onward)    Start     Dose/Rate Route Frequency Ordered Stop   06/28/22 1500  cefTRIAXone (ROCEPHIN) 2 g in sodium chloride 0.9 % 100 mL IVPB  Status:  Discontinued        2 g 200 mL/hr over 30 Minutes Intravenous Every 24 hours 06/27/22 1958 06/27/22 2029   06/28/22 1500  azithromycin (ZITHROMAX) 500 mg in sodium chloride 0.9 % 250 mL IVPB  Status:  Discontinued        500 mg 250 mL/hr over 60 Minutes Intravenous Every 24 hours 06/27/22 1958 06/27/22 2029   06/27/22 1415  cefTRIAXone (ROCEPHIN) 2 g in sodium chloride 0.9 % 100 mL IVPB  Status:  Discontinued        2 g 200 mL/hr over 30 Minutes Intravenous Every 24 hours 06/27/22 1410 06/28/22 1055   06/27/22 1415  azithromycin (ZITHROMAX) 500 mg in sodium chloride 0.9 % 250 mL IVPB  Status:  Discontinued        500 mg 250 mL/hr over 60 Minutes Intravenous Every 24 hours 06/27/22 1410 06/28/22 1055       Subjective:  Patient seen and evaluated today and is awake and alert this morning.  She did have significant amounts of bleeding from her right IJ site overnight and therefore heparin has been discontinued.  Objective: Vitals:   06/28/22 0920 06/28/22 1000 06/28/22 1045 06/28/22 1100  BP:  (!) 95/47 (!) 136/46 117/60  Pulse:  63 64 76  Resp:  (!) '21 20 20  '$ Temp:       TempSrc:      SpO2: 92% 92% 95% 93%  Weight:      Height:        Intake/Output Summary (Last 24 hours) at 06/28/2022 1106 Last data filed at 06/28/2022 0930 Gross per 24 hour  Intake 4073.04 ml  Output 925 ml  Net 3148.04 ml   Filed Weights   06/27/22 1355 06/27/22 2017 06/28/22 0445  Weight: 86.2 kg 89.3 kg 88.6 kg    Examination:  General exam: Appears calm and comfortable, right IJ site with dressings that are soaked in blood, no further oozing noted Respiratory system: Clear to auscultation. Respiratory effort normal.  Currently on 2 L nasal cannula Cardiovascular system: S1 & S2 heard, RRR.  Gastrointestinal system: Abdomen is soft Central nervous system: Alert and awake Extremities: No edema Skin: No significant lesions noted Psychiatry: Flat affect.    Data Reviewed: I have personally reviewed following labs and imaging studies  CBC: Recent Labs  Lab 06/27/22 1415 06/27/22 1416 06/28/22 0247  WBC 8.6  --  11.9*  NEUTROABS 4.6  --   --   HGB 14.6 15.3* 12.9  HCT 44.9 45.0 39.4  MCV 93.2  --  92.1  PLT 168  --  188   Basic Metabolic Panel: Recent Labs  Lab 06/23/22 1549 06/27/22 1415 06/27/22 1416 06/28/22 0247  NA 139 136 136 139  K 5.0 4.2 4.2 3.6  CL 99 97* 101 102  CO2 30 25  --  27  GLUCOSE 173* 225* 224* 118*  BUN 25* 50* 45* 40*  CREATININE 0.97 2.73* 3.00* 1.74*  CALCIUM 9.9 9.2  --  8.7*   GFR: Estimated Creatinine Clearance: 28.9 mL/min (A) (by C-G formula based on SCr of 1.74 mg/dL (H)). Liver Function Tests: Recent Labs  Lab 06/27/22 1415  AST 29  ALT 16  ALKPHOS 80  BILITOT 0.6  PROT 7.4  ALBUMIN 3.7   No results for input(s): "LIPASE", "AMYLASE" in the last 168 hours. No results for input(s): "AMMONIA" in the last 168 hours. Coagulation Profile: Recent Labs  Lab 06/27/22 1434 06/28/22 0247  INR 1.7* 1.5*   Cardiac Enzymes: No results for input(s): "CKTOTAL", "CKMB", "CKMBINDEX", "TROPONINI" in the last 168  hours. BNP (last 3 results) No results for input(s): "PROBNP" in the last 8760 hours. HbA1C: No results for input(s): "HGBA1C" in the last 72 hours. CBG: Recent Labs  Lab 06/27/22 2032 06/27/22 2357 06/28/22 0446 06/28/22 0737  GLUCAP 171* 140* 124* 121*   Lipid Profile: No results for input(s): "CHOL", "HDL", "LDLCALC", "TRIG", "CHOLHDL", "LDLDIRECT" in the last 72 hours. Thyroid Function Tests: No results for input(s): "TSH", "T4TOTAL", "FREET4", "T3FREE", "THYROIDAB" in the last 72 hours. Anemia Panel: No results for input(s): "VITAMINB12", "FOLATE", "FERRITIN", "TIBC", "IRON", "RETICCTPCT" in the last 72 hours. Sepsis Labs: Recent Labs  Lab 06/27/22 1415 06/27/22 1651 06/27/22 1746 06/27/22 1955 06/28/22 0247  PROCALCITON  --   --  <0.10  --  <0.10  LATICACIDVEN 1.9 1.7 4.3* 1.5  --     Recent Results (from the past 240  hour(s))  Blood Culture (routine x 2)     Status: None (Preliminary result)   Collection Time: 06/27/22  2:15 PM   Specimen: BLOOD LEFT FOREARM  Result Value Ref Range Status   Specimen Description   Final    BLOOD LEFT FOREARM BOTTLES DRAWN AEROBIC AND ANAEROBIC   Special Requests Blood Culture adequate volume  Final   Culture   Final    NO GROWTH < 12 HOURS Performed at [facility], [address], [place], [place] [address]    Report Status PENDING  Incomplete  Blood Culture (routine x 2)     Status: None (Preliminary result)   Collection Time: 06/27/22  2:15 PM   Specimen: BLOOD RIGHT FOREARM  Result Value Ref Range Status   Specimen Description   Final    BLOOD RIGHT FOREARM BOTTLES DRAWN AEROBIC AND ANAEROBIC   Special Requests   Final    Blood Culture results may not be optimal due to an inadequate volume of blood received in culture bottles   Culture   Final    NO GROWTH < 12 HOURS Performed at [facility], [address], [place], [place] [address]    Report Status PENDING  Incomplete  Resp panel by RT-PCR (RSV, Flu A&B, Covid)  Anterior Nasal Swab     Status: None   Collection Time: 06/27/22  2:34 PM   Specimen: Anterior Nasal Swab  Result Value Ref Range Status   SARS Coronavirus 2 by RT PCR NEGATIVE NEGATIVE Final    Comment: (NOTE) SARS-CoV-2 target nucleic acids are NOT DETECTED.  The SARS-CoV-2 RNA is generally detectable in upper respiratory specimens during the acute phase of infection. The lowest concentration of SARS-CoV-2 viral copies this assay can detect is 138 copies/mL. A negative result does not preclude SARS-Cov-2 infection and should not be used as the sole basis for treatment or other patient management decisions. A negative result may occur with  improper specimen collection/handling, submission of specimen other than nasopharyngeal swab, presence of viral mutation(s) within the areas targeted by this assay, and inadequate number of viral copies(<138 copies/mL). A negative result must be combined with clinical observations, patient history, and epidemiological information. The expected result is Negative.  Fact Sheet for Patients:  EntrepreneurPulse.com.au  Fact Sheet for Healthcare Providers:  IncredibleEmployment.be  This test is no t yet approved or cleared by the [place] FDA and  has been authorized for detection and/or diagnosis of SARS-CoV-2 by FDA under an Emergency Use Authorization (EUA). This EUA will remain  in effect (meaning this test can be used) for the duration of the COVID-19 declaration under Section 564(b)(1) of the Act, 21 U.S.C.section 360bbb-3(b)(1), unless the authorization is terminated  or revoked sooner.       Influenza A by PCR NEGATIVE NEGATIVE Final   Influenza B by PCR NEGATIVE NEGATIVE Final    Comment: (NOTE) The Xpert Xpress SARS-CoV-2/FLU/RSV plus assay is intended as an aid in the diagnosis of influenza from Nasopharyngeal swab specimens and should not be used as a sole basis for treatment. Nasal washings  and aspirates are unacceptable for Xpert Xpress SARS-CoV-2/FLU/RSV testing.  Fact Sheet for Patients: EntrepreneurPulse.com.au  Fact Sheet for Healthcare Providers: IncredibleEmployment.be  This test is not yet approved or cleared by the [place] FDA and has been authorized for detection and/or diagnosis of SARS-CoV-2 by FDA under an Emergency Use Authorization (EUA). This EUA will remain in effect (meaning this test can be used) for the duration of the COVID-19  declaration under Section 564(b)(1) of the Act, 21 U.S.C. section 360bbb-3(b)(1), unless the authorization is terminated or revoked.     Resp Syncytial Virus by PCR NEGATIVE NEGATIVE Final    Comment: (NOTE) Fact Sheet for Patients: EntrepreneurPulse.com.au  Fact Sheet for Healthcare Providers: IncredibleEmployment.be  This test is not yet approved or cleared by the [place] FDA and has been authorized for detection and/or diagnosis of SARS-CoV-2 by FDA under an Emergency Use Authorization (EUA). This EUA will remain in effect (meaning this test can be used) for the duration of the COVID-19 declaration under Section 564(b)(1) of the Act, 21 U.S.C. section 360bbb-3(b)(1), unless the authorization is terminated or revoked.  Performed at [facility], [address] [place], [place] [address]   MRSA Next Gen by PCR, Nasal     Status: None   Collection Time: 06/27/22  8:30 PM   Specimen: Nasal Mucosa; Nasal Swab  Result Value Ref Range Status   MRSA by PCR Next Gen NOT DETECTED NOT DETECTED Final    Comment: (NOTE) The GeneXpert MRSA Assay (FDA approved for NASAL specimens only), is one component of a comprehensive MRSA colonization surveillance program. It is not intended to diagnose MRSA infection nor to guide or monitor treatment for MRSA infections. Test performance is not FDA approved in patients less than 55 years old. Performed  at [facility], [address], [place], [place] [address]          Radiology Studies: [facility] Chest [facility] 1 View  Result Date: 06/27/2022 CLINICAL DATA:  Catheter placement. EXAM: PORTABLE CHEST 1 VIEW COMPARISON:  Same day. FINDINGS: Stable cardiomediastinal silhouette with mild central pulmonary vascular congestion. Status post right shoulder arthroplasty. There is interval placement of right internal jugular catheter with distal tip directed into the right subclavian vein. No pneumothorax is noted. IMPRESSION: Interval placement of right internal jugular catheter with distal tip directed into the right subclavian vein; repositioning is recommended. Electronically Signed   By: Marijo Conception M.D.   On: 06/27/2022 15:32   DG Chest Port 1 View  Result Date: 06/27/2022 CLINICAL DATA:  Questionable sepsis - evaluate for abnormality EXAM: PORTABLE CHEST - 1 VIEW COMPARISON:  06/18/2022 FINDINGS: Cardiac silhouette is prominent. There is pulmonary interstitial prominence with vascular congestion. No focal consolidation. No pneumothorax or pleural effusion identified. Aorta is calcified. IMPRESSION: Findings suggest CHF. Electronically Signed   By: Sammie Bench M.D.   On: 06/27/2022 15:01        Scheduled Meds:  Chlorhexidine Gluconate Cloth  6 each Topical Daily   fluticasone furoate-vilanterol  1 puff Inhalation Daily   insulin aspart  0-20 Units Subcutaneous Q4H   insulin glargine-yfgn  34 Units Subcutaneous QHS   Continuous Infusions:  heparin Stopped (06/28/22 0640)   lactated ringers 100 mL/hr at 06/28/22 0626   norepinephrine (LEVOPHED) Adult infusion 2 mcg/min (06/28/22 1105)     LOS: 1 day    Critical care time spent: 50 minutes.    Jrue Yambao Darleen Crocker, DO [facility]  If 7PM-7AM, please contact night-coverage [email or web address] 06/28/2022, 11:06 AM

## 2022-06-28 NOTE — Progress Notes (Signed)
RIJ dressing changed due to leaking blood and being saturated with blood. Upon removal of old dressing, 2 large clots noted. RIJ site cleaned and redressed in a sterile manner, surgicel and surgifoam applied under pressure dressing hopeful to stop bleeding from RIJ site.

## 2022-06-28 NOTE — Progress Notes (Signed)
Patient on Heparin infusion. Patient bleeding under RIJ dressing and down neck. Clot appears to be forming. MD in ED notified of bleeding by ED RN. Awaiting response/orders.

## 2022-06-28 NOTE — Progress Notes (Signed)
ANTICOAGULATION CONSULT NOTE   Pharmacy Consult for heparin Indication: atrial fibrillation  Allergies  Allergen Reactions   Iodine Anaphylaxis   Ivp Dye [Iodinated Contrast Media] Anaphylaxis and Swelling    Throat closes    Latex Other (See Comments)    Latex tape pulls skin with it   Tizanidine Other (See Comments)    Weakness, goofy, bad dreams    Patient Measurements: Height: '5\' 2"'$  (157.5 cm) Weight: 89.3 kg (196 lb 13.9 oz) IBW/kg (Calculated) : 50.1 Heparin Dosing Weight: 69.7 kg   Vital Signs: Temp: 98.3 F (36.8 C) (01/26 2359) Temp Source: Oral (01/26 2359) BP: 129/57 (01/27 0330) Pulse Rate: 58 (01/27 0330)  Labs: Recent Labs    06/27/22 1415 06/27/22 1416 06/27/22 1434 06/27/22 1651 06/28/22 0247  HGB 14.6 15.3*  --   --  12.9  HCT 44.9 45.0  --   --  39.4  PLT 168  --   --   --  169  APTT  --   --  29  --  61*  LABPROT  --   --  20.2*  --  17.8*  INR  --   --  1.7*  --  1.5*  HEPARINUNFRC  --   --   --   --  >1.10*  CREATININE 2.73* 3.00*  --   --   --   TROPONINIHS 15  --   --  14  --      Estimated Creatinine Clearance: 16.8 mL/min (A) (by C-G formula based on SCr of 3 mg/dL (H)).   Medical History: Past Medical History:  Diagnosis Date   Acute exacerbation of CHF (congestive heart failure) ([place]) 12/13/2020   Acute on chronic diastolic CHF (congestive heart failure)/HFpEF Exacerbation 12/16/2020   Acute on chronic respiratory failure with hypoxia ([place]) 01/22/2019   Acute respiratory failure with hypoxia ([place]) 12/16/2020   Asthma    Atrial fibrillation with RVR 05/19/2017   Bipolar I disorder 01/23/2015   CAD (coronary artery disease)    Nonobstructive; Managed by Dr. 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 Wallace Keller   RLS (restless legs syndrome) 04/27/2015    Medications:  Scheduled:   Chlorhexidine Gluconate Cloth  6 each Topical Daily   fluticasone furoate-vilanterol  1 puff Inhalation Daily   insulin aspart  0-20 Units Subcutaneous Q4H   insulin glargine-yfgn  34 Units Subcutaneous QHS   Thrombi-Pad  3 each Topical Once    Assessment: 5 yof presenting with generalized weakness, hypotension and hypersomnolence. On apixaban for hx Afib (LD 1/25 PM).  Hgb 15.3, plt 168. No s/sx of bleeding. INR 1.7 on admission (apixaban can qualitatively increase INR). Scr 3 (CrCl 16 mL/min) -  baseline Scr Scr ~0.8 per chart review.   1/27 AM update:  aPTT sub-therapeutic   Goal of Therapy:  Heparin level 0.3-0.7 units/ml aPTT 66-102 seconds Monitor platelets by anticoagulation protocol: Yes   Plan:  Inc heparin to 1050 units/hr 1200 heparin level and aPTT  Narda Bonds, PharmD, BCPS Clinical Pharmacist Phone: [phone]

## 2022-06-29 ENCOUNTER — Inpatient Hospital Stay (HOSPITAL_COMMUNITY): Payer: Medicare Other

## 2022-06-29 DIAGNOSIS — R579 Shock, unspecified: Secondary | ICD-10-CM | POA: Diagnosis not present

## 2022-06-29 LAB — CBC
HCT: 34.3 % — ABNORMAL LOW (ref 36.0–46.0)
Hemoglobin: 11.3 g/dL — ABNORMAL LOW (ref 12.0–15.0)
MCH: 30.5 pg (ref 26.0–34.0)
MCHC: 32.9 g/dL (ref 30.0–36.0)
MCV: 92.7 fL (ref 80.0–100.0)
Platelets: 115 10*3/uL — ABNORMAL LOW (ref 150–400)
RBC: 3.7 MIL/uL — ABNORMAL LOW (ref 3.87–5.11)
RDW: 12.4 % (ref 11.5–15.5)
WBC: 6.6 10*3/uL (ref 4.0–10.5)
nRBC: 0 % (ref 0.0–0.2)

## 2022-06-29 LAB — MAGNESIUM: Magnesium: 1.7 mg/dL (ref 1.7–2.4)

## 2022-06-29 LAB — BASIC METABOLIC PANEL
Anion gap: 4 — ABNORMAL LOW (ref 5–15)
BUN: 23 mg/dL (ref 8–23)
CO2: 26 mmol/L (ref 22–32)
Calcium: 8 mg/dL — ABNORMAL LOW (ref 8.9–10.3)
Chloride: 106 mmol/L (ref 98–111)
Creatinine, Ser: 0.9 mg/dL (ref 0.44–1.00)
GFR, Estimated: >60 mL/min (ref >60)
Glucose, Bld: 159 mg/dL — ABNORMAL HIGH (ref 70–99)
Potassium: 4 mmol/L (ref 3.5–5.1)
Sodium: 136 mmol/L (ref 135–145)

## 2022-06-29 LAB — GLUCOSE, CAPILLARY
Glucose-Capillary: 165 mg/dL — ABNORMAL HIGH (ref 70–99)
Glucose-Capillary: 120 mg/dL — ABNORMAL HIGH (ref 70–99)
Glucose-Capillary: 182 mg/dL — ABNORMAL HIGH (ref 70–99)

## 2022-06-29 LAB — PROCALCITONIN: Procalcitonin: <0.1 ng/mL

## 2022-06-29 MED ORDER — APIXABAN 5 MG PO TABS: 5.0000 mg | ORAL_TABLET | Freq: Two times a day (BID) | ORAL | 0 refills | Status: DC

## 2022-06-29 MED ORDER — MELATONIN 3 MG PO TABS
6.0000 mg | ORAL_TABLET | Freq: Once | ORAL | Status: AC
  Administered 2022-06-29: 6 mg via ORAL
  Filled 2022-06-29: qty 2

## 2022-06-29 MED ORDER — FUROSEMIDE 40 MG PO TABS: 20.0000 mg | ORAL_TABLET | Freq: Two times a day (BID) | ORAL | 0 refills | Status: DC

## 2022-06-29 NOTE — Progress Notes (Signed)
Right intrajugular central line has been removed without complication. Catheter tip intact. Some slight swelling at the puncture site that was already present before removal. Patient reports no shortness of breath and saturation is 92% RA. A&O x 3 (no change).

## 2022-06-29 NOTE — TOC Transition Note (Signed)
Transition of Care [facility]) - CM/SW Discharge Note   Patient Details  Name: Amber Stephenson MRN: [id number] Date of Birth: 05/07/1946  Transition of Care [facility]) CM/SW Contact:  Iona Beard, [place] Phone Number: 06/29/2022, 10:48 AM   Clinical Narrative:    Pt is high risk for readmission. CSW spoke with pts spouse to complete assessement. Pt is mostly independent in completing ADLs, her husband is able to assist with bathing as needed. Pts husband is able to provide transportation when needed. Pt is active with [facility] for PT services. CSW requested that MD place new [facility] PT orders. CSW updated Tommi Rumps with [name] of plan for possible D/C today and that new [place] orders will be placed. Pt does not use any DME in the home. TOC signing off.   Final next level of care: Home w Home Health Services Barriers to Discharge: Barriers Resolved   Patient Goals and CMS Choice CMS Medicare.gov Compare Post Acute Care list provided to:: Patient Choice offered to / list presented to : Patient, Spouse  Discharge Placement                         Discharge Plan and Services Additional resources added to the After Visit Summary for                            [facility] Arranged: PT [facility] Agency: [place] Date [place]: 06/29/22   Representative spoke with at [place]: [place] ([place]) Interventions SDOH Screenings   Food Insecurity: No Food Insecurity (06/28/2022)  Housing: Low Risk  (06/28/2022)  Transportation Needs: No Transportation Needs (06/28/2022)  Utilities: Not At Risk (06/28/2022)  Alcohol Screen: Low Risk  (12/13/2021)  Depression (PHQ2-9): Low Risk  (06/18/2022)  Recent Concern: Depression (PHQ2-9) - High Risk (04/17/2022)  Financial Resource Strain: Low Risk  (12/13/2021)  Physical Activity: Inactive (12/13/2021)  Social Connections: Socially Integrated (12/13/2021)  Stress: No Stress Concern Present (12/13/2021)  Tobacco Use: Low  Risk  (06/27/2022)     Readmission Risk Interventions    06/29/2022   10:48 AM  Readmission Risk Prevention Plan  Transportation Screening Complete  HRI or [facility] Complete  Social Work Consult for [place] Planning/Counseling Complete  Palliative Care Screening Not Applicable  Medication Review Press photographer) Complete

## 2022-06-29 NOTE — Discharge Summary (Addendum)
Physician Discharge Summary  Amber Stephenson SEG:[id number] DOB: 1945/11/04 DOA: 06/27/2022  PCP: [name], MD  Admit date: 06/27/2022  Discharge date: 06/29/2022  Admitted From:Home  Disposition:  Home  Recommendations for Outpatient Follow-up:  Follow up with PCP in 1-2 weeks Please obtain BMP/CBC in one week Follow-up with heart failure team as scheduled Continue home medications as prior with the exception of Lasix which will be kept at 20 mg twice daily for now and instructions given to add an extra 20 mg in a.m. if greater than 3 pound weight gain in 1 day or greater than 5 pound weight gain over the course of a week or edema or shortness of breath. Okay to resume Eliquis 1/30   Home Health: None  Equipment/Devices: None  Discharge Condition:Stable  CODE STATUS: Full  Diet recommendation: Heart Healthy/carb modified  Brief/Interim Summary:  Amber Stephenson is a 77 y.o. female with medical history significant of heart failure with preserved EF, atrial fibrillation on chronic anticoagulation, chronic back pain on opiate management, diabetes type 2, diabetic neuropathy, hypertension, OSA, bipolar, pulmonary hypertension, asthma.  Was brought to the hospital via EMS secondary to generalized weakness, hypotension, hypersomnolence.  She was also noted to be quite hypotensive and much of this appears to be related to her recent increase in Lasix dosage as well as change in home narcotic medications from Dilaudid to morphine long-acting.  She required right IJ central venous line placement and initiation of norepinephrine for blood pressure support as well as aggressive IV fluids.  Her blood pressure and kidney function quickly improved with aggressive IV fluids and she was weaned off of norepinephrine on 1/27.  She was essentially back to her baseline level of mentation on that same day, but was noted to have some persistent bleeding at the site of her right IJ insertion for which  heparin drip was discontinued.  She was monitored for 24 hours and central venous line was removed without any further bleeding on the day of discharge.  She is okay to resume her home Eliquis tonight.  Her blood pressures are stable and she has been instructed to remain on 20 mg of Lasix twice daily as she was previously doing and add an extra dose in the morning if needed for symptoms as noted above.  No other acute events noted and she is in stable condition for discharge.  Discharge Diagnoses:  Principal Problem:   Shock ([place]) Active Problems:   Chronic diastolic heart failure (HCC)   Long term current use of anticoagulant   Chronic back pain   Chronic atrial fibrillation (HCC)   Essential hypertension   Diabetic neuropathy associated with type 2 diabetes mellitus ([address])   Acute metabolic encephalopathy   AKI (acute kidney injury) ([place])  Principal discharge diagnosis: Prerenal AKI along with hypovolemic shock and associated metabolic encephalopathy in the setting of aggressive diuresis.  Discharge Instructions  Discharge Instructions     Diet - low sodium heart healthy   Complete by: As directed    Increase activity slowly   Complete by: As directed       Allergies as of 06/29/2022       Reactions   Iodine Anaphylaxis   Ivp Dye [iodinated Contrast Media] Anaphylaxis, Swelling   Throat closes   Latex Other (See Comments)   Latex tape pulls skin with it   Tizanidine Other (See Comments)   Weakness, goofy, bad dreams        Medication List  TAKE these medications    albuterol (2.5 MG/3ML) 0.083% nebulizer solution Commonly known as: PROVENTIL Take 3 mLs (2.5 mg total) by nebulization every 6 (six) hours as needed for wheezing or shortness of breath. What changed: when to take this   albuterol 108 (90 Base) MCG/ACT inhaler Commonly known as: VENTOLIN HFA Inhale 2 puffs into the lungs every 6 (six) hours as needed for wheezing or shortness of breath. What  changed: when to take this   apixaban 5 MG Tabs tablet Commonly known as: Eliquis Take 1 tablet (5 mg total) by mouth 2 (two) times daily. Start taking on: July 01, 2022 What changed:  how much to take how to take this when to take this additional instructions These instructions start on July 01, 2022. If you are unsure what to do until then, ask your doctor or other care provider.   ARIPiprazole 2 MG tablet Commonly known as: ABILIFY Take 2 mg by mouth at bedtime.   atorvastatin 40 MG tablet Commonly known as: LIPITOR TAKE 1 TABLET(40 MG) BY MOUTH DAILY What changed:  how much to take how to take this when to take this additional instructions   benztropine 1 MG tablet Commonly known as: COGENTIN Take 1 tablet (1 mg total) by mouth 2 (two) times daily.   Dexcom G6 Transmitter Misc Use as instructed to check blood sugar. Change every 90 days   Dexcom G7 Sensor Misc Change sensor every 10 days   Emgality 120 MG/ML Soaj Generic drug: Galcanezumab-gnlm One injection per month What changed:  how much to take how to take this when to take this additional instructions   escitalopram 10 MG tablet Commonly known as: LEXAPRO Take 10 mg by mouth daily.   fluconazole 150 MG tablet Commonly known as: DIFLUCAN Take 1 now and can repeat 1 in 3 days   Fluticasone-Salmeterol 500-50 MCG/DOSE Aepb Commonly known as: Advair Diskus Inhale 1 puff into the lungs in the morning and at bedtime. What changed:  when to take this reasons to take this   furosemide 40 MG tablet Commonly known as: LASIX Take 0.5 tablets (20 mg total) by mouth 2 (two) times daily. Take extra '20mg'$  in am if dyspnea, weight gain of 3 lbs or greater, or edema. What changed:  how much to take additional instructions   hydrOXYzine 25 MG capsule Commonly known as: VISTARIL Take 1 capsule (25 mg total) by mouth every 8 (eight) hours as needed.   icosapent Ethyl 1 g capsule Commonly known as:  VASCEPA TAKE 2 CAPSULES TWICE A DAY WITH MEALS What changed: See the new instructions.   insulin lispro 100 UNIT/ML KwikPen Commonly known as: HumaLOG KwikPen Max daily 50 units What changed:  how much to take how to take this when to take this additional instructions   Insulin Pen Needle 29G X 12MM Misc 1 Device by Does not apply route daily in the afternoon.   Lantus SoloStar 100 UNIT/ML Solostar Pen Generic drug: insulin glargine Inject 68 Units into the skin daily. What changed:  how much to take when to take this   lisinopril 10 MG tablet Commonly known as: ZESTRIL Take 1 tablet (10 mg total) by mouth daily.   LORazepam 1 MG tablet Commonly known as: ATIVAN Take 1 tablet (1 mg total) by mouth 2 (two) times daily. And give 2 tablets by mouth at bedtime What changed:  how much to take additional instructions   metFORMIN 500 MG tablet Commonly known as: GLUCOPHAGE Take 1  tablet (500 mg total) by mouth 2 (two) times daily with a meal. TAKE 1 TABLET(500 MG) BY MOUTH TWICE DAILY AFTER A MEAL What changed: additional instructions   metoprolol succinate 25 MG 24 hr tablet Commonly known as: Toprol XL Take 1 tablet (25 mg total) by mouth in the morning and at bedtime. What changed: additional instructions   metoprolol succinate 100 MG 24 hr tablet Commonly known as: TOPROL-XL TAKE 1 TABLET IN THE MORNING AND AT BEDTIME What changed:  how much to take how to take this when to take this additional instructions   morphine 15 MG 12 hr tablet Commonly known as: MS Contin Take 1 tablet (15 mg total) by mouth every 12 (twelve) hours. This is a long acting form, so take 1 tab 2x/day to keep pain more even- it is NOT a short acting pain med like you were taking- for chronic pain   Mounjaro 5 MG/0.5ML Pen Generic drug: tirzepatide Inject 5 mg into the skin once a week.   naloxone 4 MG/0.1ML Liqd nasal spray kit Commonly known as: NARCAN Place 1 spray into the nose  once.   nystatin ointment Commonly known as: MYCOSTATIN Apply 1 Application topically 2 (two) times daily. What changed:  when to take this reasons to take this   nystatin powder Commonly known as: MYCOSTATIN/NYSTOP Apply 1 Application topically as needed (rash). What changed: Another medication with the same name was changed. Make sure you understand how and when to take each.   ondansetron 8 MG disintegrating tablet Commonly known as: ZOFRAN-ODT Take 1 tablet (8 mg total) by mouth every 6 (six) hours as needed for nausea or vomiting.   OXcarbazepine 150 MG tablet Commonly known as: TRILEPTAL Take 1 tablet (150 mg total) by mouth 2 (two) times daily.   potassium chloride SA 20 MEQ tablet Commonly known as: KLOR-CON M Take 1 tablet (20 mEq total) by mouth daily. What changed:  how much to take when to take this   pregabalin 200 MG capsule Commonly known as: LYRICA TAKE 1 CAPSULE(200 MG) BY MOUTH TWICE DAILY What changed: See the new instructions.   Prolia 60 MG/ML Sosy injection Generic drug: denosumab Inject 60 mg into the skin every 6 (six) months.   senna-docusate 8.6-50 MG tablet Commonly known as: Senokot-S Take 1 tablet by mouth at bedtime.   TYLENOL PO Take 2 tablets by mouth as needed (pain/headache).   Vitamin D (Cholecalciferol) 25 MCG (1000 UT) Tabs Take 1,000 Units by mouth daily in the afternoon.        Follow-up Information     [facility] Follow up.   Specialty: Home Health Services Contact information: [place] [address] [phone]         [name], MD. Schedule an appointment as soon as possible for a visit in 1 week(s).   Specialty: Family Medicine Contact information: [place] [address] [phone]                Allergies  Allergen Reactions   Iodine Anaphylaxis   Ivp Dye [Iodinated Contrast Media] Anaphylaxis and Swelling    Throat closes    Latex  Other (See Comments)    Latex tape pulls skin with it   Tizanidine Other (See Comments)    Weakness, goofy, bad dreams    Consultations: PCCM   Procedures/Studies: DG CHEST PORT 1 VIEW  Result Date: 06/29/2022 CLINICAL DATA:  Dyspnea EXAM: PORTABLE CHEST 1  VIEW COMPARISON:  Chest radiograph June 28, 2022. FINDINGS: Interval removal of the right CVC. Cardiac monitoring leads coiled over the chest. Similar enlarged cardiac silhouette. Aortic atherosclerosis. Elevation of the right hemidiaphragm. Dependent atelectasis bilaterally. Faint opacity in the left lung is unchanged from prior. Probable small left pleural effusion. No visible pneumothorax. Reverse right shoulder arthroplasty. IMPRESSION: Dependent bilateral atelectasis with stable faint left retrocardiac opacity and probable small left pleural effusion Electronically Signed   By: Dahlia Bailiff M.D.   On: 06/29/2022 13:10   DG CHEST PORT 1 VIEW  Result Date: 06/28/2022 CLINICAL DATA:  Check central venous catheter placement EXAM: PORTABLE CHEST 1 VIEW COMPARISON:  06/27/2022 FINDINGS: Cardiac shadow is stable. Right jugular central line is again identified coursing into the right subclavian vein. The tip is more centrally located near the junction of the subclavian and jugular veins. Persistent left basilar opacification is noted. IMPRESSION: Right jugular central line appears of been withdrawn slightly with the tip in the more central aspect of the right subclavian vein. Electronically Signed   By: Inez Catalina M.D.   On: 06/28/2022 17:25   DG Chest Port 1 View  Result Date: 06/27/2022 CLINICAL DATA:  Catheter placement. EXAM: PORTABLE CHEST 1 VIEW COMPARISON:  Same day. FINDINGS: Stable cardiomediastinal silhouette with mild central pulmonary vascular congestion. Status post right shoulder arthroplasty. There is interval placement of right internal jugular catheter with distal tip directed into the right subclavian vein. No  pneumothorax is noted. IMPRESSION: Interval placement of right internal jugular catheter with distal tip directed into the right subclavian vein; repositioning is recommended. Electronically Signed   By: Marijo Conception M.D.   On: 06/27/2022 15:32   DG Chest Port 1 View  Result Date: 06/27/2022 CLINICAL DATA:  Questionable sepsis - evaluate for abnormality EXAM: PORTABLE CHEST - 1 VIEW COMPARISON:  06/18/2022 FINDINGS: Cardiac silhouette is prominent. There is pulmonary interstitial prominence with vascular congestion. No focal consolidation. No pneumothorax or pleural effusion identified. Aorta is calcified. IMPRESSION: Findings suggest CHF. Electronically Signed   By: Sammie Bench M.D.   On: 06/27/2022 15:01   DG Chest 2 View  Result Date: 06/19/2022 CLINICAL DATA:  Cough and COPD. EXAM: CHEST - 2 VIEW COMPARISON:  June 13, 2022 FINDINGS: The heart size and mediastinal contours are stable. The heart size is enlarged. Mild increased pulmonary interstitium with patchy opacity of the right infrahilar region are unchanged. No pleural effusion is noted. The visualized skeletal structures are stable. IMPRESSION: Mild increased pulmonary interstitium with patchy opacity of the right infrahilar region are unchanged. Electronically Signed   By: Abelardo Diesel M.D.   On: 06/19/2022 15:50   DG CHEST PORT 1 VIEW  Result Date: 06/13/2022 CLINICAL DATA:  Shortness of breath EXAM: PORTABLE CHEST 1 VIEW COMPARISON:  CXR 06/12/22 FINDINGS: No pleural effusion. No pneumothorax. Low lung volumes. Unchanged cardiac and mediastinal contours. Prominent bilateral interstitial opacities with a more focal airspace opacity in the right infrahilar region, which may be secondary to bronchovascular crowding in the setting of low lung volumes or represent changes related to the presence of an underlying atypical infection. Right shoulder arthroplasty. Visualized upper abdomen is unremarkable. IMPRESSION: Prominent bilateral  interstitial opacities with a more focal airspace opacity in the right infrahilar region, which may be secondary to bronchovascular crowding in the setting of low lung volumes or represent changes related to the presence of an underlying infection. Electronically Signed   By: Marin Roberts M.D.   On: 06/13/2022 08:21  DG CHEST PORT 1 VIEW  Result Date: 06/12/2022 CLINICAL DATA:  Shortness of breath EXAM: PORTABLE CHEST 1 VIEW COMPARISON:  06/11/2022 FINDINGS: Indistinct right infrahilar airspace opacity compatible with atelectasis or pneumonia. Dense mitral valve calcification. Lingular subsegmental atelectasis or scarring. Low lung volumes are present, causing crowding of the pulmonary vasculature. Mild enlargement of the cardiopericardial silhouette. Reverse right total shoulder arthroplasty. IMPRESSION: 1. Indistinct right infrahilar airspace opacity compatible with atelectasis or pneumonia. 2. Mild enlargement of the cardiopericardial silhouette, without edema. 3. Dense mitral valve calcification. 4. Reverse right total shoulder arthroplasty. 5. Lingular subsegmental atelectasis or scarring. Electronically Signed   By: Van Clines M.D.   On: 06/12/2022 07:33   DG CHEST PORT 1 VIEW  Result Date: 06/11/2022 CLINICAL DATA:  Shortness of breath. History of congestive heart failure. EXAM: PORTABLE CHEST 1 VIEW COMPARISON:  06/08/2022 FINDINGS: Cardiomegaly as seen chronically. Aortic atherosclerosis. The lungs are clear. No edema or effusion. IMPRESSION: Chronic cardiomegaly. Aortic atherosclerosis. No acute finding. Electronically Signed   By: Nelson Chimes M.D.   On: 06/11/2022 11:08   ECHOCARDIOGRAM COMPLETE  Result Date: 06/09/2022    ECHOCARDIOGRAM REPORT   Patient Name:   ALLAYA ABBASI Pitta Date of Exam: 06/09/2022 Medical Rec #:  [id number]         Height:       62.0 in Accession #:    [id number]        Weight:       194.4 lb Date of Birth:  December 08, 1945          BSA:          1.889 m Patient Age:     77 years          BP:           106/75 mmHg Patient Gender: F                 HR:           105 bpm. Exam Location:  Inpatient Procedure: 2D Echo, Cardiac Doppler and Color Doppler Indications:    CHF  History:        Patient has prior history of Echocardiogram examinations. CAD,                 Arrythmias:Atrial Fibrillation; Risk Factors:Hypertension,                 Dyslipidemia and Diabetes.  Sonographer:    Danne Baxter RDCS, FE, PE Referring Phys: [phone] [place]  Sonographer Comments: Technically difficult study due to poor echo windows and suboptimal parasternal window. IMPRESSIONS  1. Left ventricular ejection fraction, by estimation, is 65 to 70%. The left ventricle has normal function. The left ventricle has no regional wall motion abnormalities. There is mild left ventricular hypertrophy. Left ventricular diastolic parameters are indeterminate.  2. Right ventricular systolic function is normal. The right ventricular size is normal.  3. Left atrial size was severely dilated.  4. The mitral valve is normal in structure. No evidence of mitral valve regurgitation. No evidence of mitral stenosis.  5. The aortic valve is normal in structure. Aortic valve regurgitation is not visualized. No aortic stenosis is present.  6. The inferior vena cava is normal in size with greater than 50% respiratory variability, suggesting right atrial pressure of 3 mmHg. FINDINGS  Left Ventricle: Left ventricular ejection fraction, by estimation, is 65 to 70%. The left ventricle has normal function. The left ventricle has no regional wall motion abnormalities.  The left ventricular internal cavity size was normal in size. There is  mild left ventricular hypertrophy. Left ventricular diastolic parameters are indeterminate. Right Ventricle: The right ventricular size is normal. No increase in right ventricular wall thickness. Right ventricular systolic function is normal. Left Atrium: Left atrial size was severely dilated.  Right Atrium: Right atrial size was normal in size. Pericardium: There is no evidence of pericardial effusion. Mitral Valve: The mitral valve is normal in structure. No evidence of mitral valve regurgitation. No evidence of mitral valve stenosis. MV peak gradient, 6.6 mmHg. The mean mitral valve gradient is 3.0 mmHg. Tricuspid Valve: The tricuspid valve is normal in structure. Tricuspid valve regurgitation is not demonstrated. No evidence of tricuspid stenosis. Aortic Valve: The aortic valve is normal in structure. Aortic valve regurgitation is not visualized. No aortic stenosis is present. Pulmonic Valve: The pulmonic valve was normal in structure. Pulmonic valve regurgitation is not visualized. No evidence of pulmonic stenosis. Aorta: The aortic root is normal in size and structure. Venous: The inferior vena cava is normal in size with greater than 50% respiratory variability, suggesting right atrial pressure of 3 mmHg. IAS/Shunts: No atrial level shunt detected by color flow Doppler.  LEFT VENTRICLE PLAX 2D LVIDd:         4.00 cm LVIDs:         2.80 cm LV PW:         1.20 cm LV IVS:        1.30 cm  RIGHT VENTRICLE RV S prime:     8.35 cm/s LEFT ATRIUM            Index LA diam:      4.90 cm  2.59 cm/m LA Vol (A4C): 190.0 ml 100.59 ml/m  AORTIC VALVE LVOT Vmax:   84.40 cm/s LVOT Vmean:  45.800 cm/s LVOT VTI:    0.109 m  AORTA Ao Asc diam: 3.40 cm MITRAL VALVE MV Area (PHT): 4.49 cm     SHUNTS MV Peak grad:  6.6 mmHg     Systemic VTI: 0.11 m MV Mean grad:  3.0 mmHg MV Vmax:       1.28 m/s MV Vmean:      77.5 cm/s MV Decel Time: 169 msec MV E velocity: 113.00 cm/s Candee Furbish MD Electronically signed by Candee Furbish MD Signature Date/Time: 06/09/2022/11:33:43 AM    Final    DG Chest Port 1 View  Result Date: 06/08/2022 CLINICAL DATA:  Shortness of breath EXAM: PORTABLE CHEST 1 VIEW COMPARISON:  November 11, 2021 FINDINGS: Stable cardiomegaly. The hila and mediastinum are stable. No pneumothorax. No suspicious nodules,  masses, or focal infiltrates. Mild atelectasis in the right base. IMPRESSION: Mild atelectasis in the right base. Electronically Signed   By: Dorise Bullion III M.D.   On: 06/08/2022 08:26     Discharge Exam: Vitals:   06/29/22 1200 06/29/22 1300  BP: 136/70 130/69  Pulse: 80 76  Resp: (!) 21 17  Temp:    SpO2: 91% 95%   Vitals:   06/29/22 1100 06/29/22 1144 06/29/22 1200 06/29/22 1300  BP: 127/88  136/70 130/69  Pulse: 86 72 80 76  Resp: (!) 23 (!) 22 (!) 21 17  Temp:  (!) 97.4 F (36.3 C)    TempSrc:  Oral    SpO2: 91% (!) 89% 91% 95%  Weight:      Height:        General: Pt is alert, awake, not in acute distress Cardiovascular: RRR, S1/S2 +,  no rubs, no gallops Respiratory: CTA bilaterally, no wheezing, no rhonchi Abdominal: Soft, NT, ND, bowel sounds + Extremities: no edema, no cyanosis    The results of significant diagnostics from this hospitalization (including imaging, microbiology, ancillary and laboratory) are listed below for reference.     Microbiology: Recent Results (from the past 240 hour(s))  Blood Culture (routine x 2)     Status: None (Preliminary result)   Collection Time: 06/27/22  2:15 PM   Specimen: BLOOD LEFT FOREARM  Result Value Ref Range Status   Specimen Description   Final    BLOOD LEFT FOREARM BOTTLES DRAWN AEROBIC AND ANAEROBIC   Special Requests Blood Culture adequate volume  Final   Culture   Final    NO GROWTH 2 DAYS Performed at [facility], [address] [place], [place] [address]    Report Status PENDING  Incomplete  Blood Culture (routine x 2)     Status: None (Preliminary result)   Collection Time: 06/27/22  2:15 PM   Specimen: BLOOD RIGHT FOREARM  Result Value Ref Range Status   Specimen Description   Final    BLOOD RIGHT FOREARM BOTTLES DRAWN AEROBIC AND ANAEROBIC   Special Requests   Final    Blood Culture results may not be optimal due to an inadequate volume of blood received in culture bottles   Culture   Final     NO GROWTH 2 DAYS Performed at [facility], [address] [place], [place] [address]    Report Status PENDING  Incomplete  Resp panel by RT-PCR (RSV, Flu A&B, Covid) Anterior Nasal Swab     Status: None   Collection Time: 06/27/22  2:34 PM   Specimen: Anterior Nasal Swab  Result Value Ref Range Status   SARS Coronavirus 2 by RT PCR NEGATIVE NEGATIVE Final    Comment: (NOTE) SARS-CoV-2 target nucleic acids are NOT DETECTED.  The SARS-CoV-2 RNA is generally detectable in upper respiratory specimens during the acute phase of infection. The lowest concentration of SARS-CoV-2 viral copies this assay can detect is 138 copies/mL. A negative result does not preclude SARS-Cov-2 infection and should not be used as the sole basis for treatment or other patient management decisions. A negative result may occur with  improper specimen collection/handling, submission of specimen other than nasopharyngeal swab, presence of viral mutation(s) within the areas targeted by this assay, and inadequate number of viral copies(<138 copies/mL). A negative result must be combined with clinical observations, patient history, and epidemiological information. The expected result is Negative.  Fact Sheet for Patients:  EntrepreneurPulse.com.au  Fact Sheet for Healthcare Providers:  IncredibleEmployment.be  This test is no t yet approved or cleared by the [place] FDA and  has been authorized for detection and/or diagnosis of SARS-CoV-2 by FDA under an Emergency Use Authorization (EUA). This EUA will remain  in effect (meaning this test can be used) for the duration of the COVID-19 declaration under Section 564(b)(1) of the Act, 21 U.S.C.section 360bbb-3(b)(1), unless the authorization is terminated  or revoked sooner.       Influenza A by PCR NEGATIVE NEGATIVE Final   Influenza B by PCR NEGATIVE NEGATIVE Final    Comment: (NOTE) The Xpert Xpress  SARS-CoV-2/FLU/RSV plus assay is intended as an aid in the diagnosis of influenza from Nasopharyngeal swab specimens and should not be used as a sole basis for treatment. Nasal washings and aspirates are unacceptable for Xpert Xpress SARS-CoV-2/FLU/RSV testing.  Fact Sheet for Patients: EntrepreneurPulse.com.au  Fact Sheet  for Healthcare Providers: IncredibleEmployment.be  This test is not yet approved or cleared by the [place] and has been authorized for detection and/or diagnosis of SARS-CoV-2 by FDA under an Emergency Use Authorization (EUA). This EUA will remain in effect (meaning this test can be used) for the duration of the COVID-19 declaration under Section 564(b)(1) of the Act, 21 U.S.C. section 360bbb-3(b)(1), unless the authorization is terminated or revoked.     Resp Syncytial Virus by PCR NEGATIVE NEGATIVE Final    Comment: (NOTE) Fact Sheet for Patients: EntrepreneurPulse.com.au  Fact Sheet for Healthcare Providers: IncredibleEmployment.be  This test is not yet approved or cleared by the [place] FDA and has been authorized for detection and/or diagnosis of SARS-CoV-2 by FDA under an Emergency Use Authorization (EUA). This EUA will remain in effect (meaning this test can be used) for the duration of the COVID-19 declaration under Section 564(b)(1) of the Act, 21 U.S.C. section 360bbb-3(b)(1), unless the authorization is terminated or revoked.  Performed at [facility], [address], [place], [place] [address]   MRSA Next Gen by PCR, Nasal     Status: None   Collection Time: 06/27/22  8:30 PM   Specimen: Nasal Mucosa; Nasal Swab  Result Value Ref Range Status   MRSA by PCR Next Gen NOT DETECTED NOT DETECTED Final    Comment: (NOTE) The GeneXpert MRSA Assay (FDA approved for NASAL specimens only), is one component of a comprehensive MRSA colonization surveillance program.  It is not intended to diagnose MRSA infection nor to guide or monitor treatment for MRSA infections. Test performance is not FDA approved in patients less than 22 years old. Performed at [facility], [address], [place], [place] [address]      Labs: BNP (last 3 results) Recent Labs    06/08/22 0759 06/23/22 1549  BNP 245.6* 149.7*   Basic Metabolic Panel: Recent Labs  Lab 06/23/22 1549 06/27/22 1415 06/27/22 1416 06/28/22 0247 06/29/22 0519  NA 139 136 136 139 136  K 5.0 4.2 4.2 3.6 4.0  CL 99 97* 101 102 106  CO2 30 25  --  27 26  GLUCOSE 173* 225* 224* 118* 159*  BUN 25* 50* 45* 40* 23  CREATININE 0.97 2.73* 3.00* 1.74* 0.90  CALCIUM 9.9 9.2  --  8.7* 8.0*  MG  --   --   --   --  1.7   Liver Function Tests: Recent Labs  Lab 06/27/22 1415  AST 29  ALT 16  ALKPHOS 80  BILITOT 0.6  PROT 7.4  ALBUMIN 3.7   No results for input(s): "LIPASE", "AMYLASE" in the last 168 hours. No results for input(s): "AMMONIA" in the last 168 hours. CBC: Recent Labs  Lab 06/27/22 1415 06/27/22 1416 06/28/22 0247 06/29/22 0519  WBC 8.6  --  11.9* 6.6  NEUTROABS 4.6  --   --   --   HGB 14.6 15.3* 12.9 11.3*  HCT 44.9 45.0 39.4 34.3*  MCV 93.2  --  92.1 92.7  PLT 168  --  169 115*   Cardiac Enzymes: No results for input(s): "CKTOTAL", "CKMB", "CKMBINDEX", "TROPONINI" in the last 168 hours. BNP: Invalid input(s): "POCBNP" CBG: Recent Labs  Lab 06/28/22 2006 06/28/22 2323 06/29/22 0438 06/29/22 0738 06/29/22 1143  GLUCAP 286* 279* 165* 120* 182*   D-Dimer No results for input(s): "DDIMER" in the last 72 hours. Hgb A1c No results for input(s): "HGBA1C" in the last 72 hours. Lipid Profile No results for input(s): "CHOL", "  HDL", "LDLCALC", "TRIG", "CHOLHDL", "LDLDIRECT" in the last 72 hours. Thyroid function studies No results for input(s): "TSH", "T4TOTAL", "T3FREE", "THYROIDAB" in the last 72 hours.  Invalid input(s): "FREET3" Anemia work up No results for  input(s): "VITAMINB12", "FOLATE", "FERRITIN", "TIBC", "IRON", "RETICCTPCT" in the last 72 hours. Urinalysis    Component Value Date/Time   COLORURINE YELLOW 06/27/2022 1532   APPEARANCEUR CLEAR 06/27/2022 1532   APPEARANCEUR Clear 03/26/2022 0815   LABSPEC 1.017 06/27/2022 1532   PHURINE 5.0 06/27/2022 1532   GLUCOSEU NEGATIVE 06/27/2022 1532   HGBUR NEGATIVE 06/27/2022 1532   BILIRUBINUR NEGATIVE 06/27/2022 1532   BILIRUBINUR Negative 03/26/2022 0815   KETONESUR NEGATIVE 06/27/2022 1532   PROTEINUR 30 (A) 06/27/2022 1532   UROBILINOGEN 0.2 07/23/2015 1350   NITRITE NEGATIVE 06/27/2022 [place] 06/27/2022 1532   Sepsis Labs Recent Labs  Lab 06/27/22 1415 06/28/22 0247 06/29/22 0519  WBC 8.6 11.9* 6.6   Microbiology Recent Results (from the past 240 hour(s))  Blood Culture (routine x 2)     Status: None (Preliminary result)   Collection Time: 06/27/22  2:15 PM   Specimen: BLOOD LEFT FOREARM  Result Value Ref Range Status   Specimen Description   Final    BLOOD LEFT FOREARM BOTTLES DRAWN AEROBIC AND ANAEROBIC   Special Requests Blood Culture adequate volume  Final   Culture   Final    NO GROWTH 2 DAYS Performed at [facility], [address], [place], [place] [address]    Report Status PENDING  Incomplete  Blood Culture (routine x 2)     Status: None (Preliminary result)   Collection Time: 06/27/22  2:15 PM   Specimen: BLOOD RIGHT FOREARM  Result Value Ref Range Status   Specimen Description   Final    BLOOD RIGHT FOREARM BOTTLES DRAWN AEROBIC AND ANAEROBIC   Special Requests   Final    Blood Culture results may not be optimal due to an inadequate volume of blood received in culture bottles   Culture   Final    NO GROWTH 2 DAYS Performed at [facility], [address], [place], [place] [address]    Report Status PENDING  Incomplete  Resp panel by RT-PCR (RSV, Flu A&B, Covid) Anterior Nasal Swab     Status: None   Collection Time: 06/27/22  2:34  PM   Specimen: Anterior Nasal Swab  Result Value Ref Range Status   SARS Coronavirus 2 by RT PCR NEGATIVE NEGATIVE Final    Comment: (NOTE) SARS-CoV-2 target nucleic acids are NOT DETECTED.  The SARS-CoV-2 RNA is generally detectable in upper respiratory specimens during the acute phase of infection. The lowest concentration of SARS-CoV-2 viral copies this assay can detect is 138 copies/mL. A negative result does not preclude SARS-Cov-2 infection and should not be used as the sole basis for treatment or other patient management decisions. A negative result may occur with  improper specimen collection/handling, submission of specimen other than nasopharyngeal swab, presence of viral mutation(s) within the areas targeted by this assay, and inadequate number of viral copies(<138 copies/mL). A negative result must be combined with clinical observations, patient history, and epidemiological information. The expected result is Negative.  Fact Sheet for Patients:  EntrepreneurPulse.com.au  Fact Sheet for Healthcare Providers:  IncredibleEmployment.be  This test is no t yet approved or cleared by the [place] FDA and  has been authorized for detection and/or diagnosis of SARS-CoV-2 by FDA under an Emergency Use Authorization (EUA). This EUA will remain  in effect (meaning this test can be used) for the duration of the COVID-19 declaration under Section 564(b)(1) of the Act, 21 U.S.C.section 360bbb-3(b)(1), unless the authorization is terminated  or revoked sooner.       Influenza A by PCR NEGATIVE NEGATIVE Final   Influenza B by PCR NEGATIVE NEGATIVE Final    Comment: (NOTE) The Xpert Xpress SARS-CoV-2/FLU/RSV plus assay is intended as an aid in the diagnosis of influenza from Nasopharyngeal swab specimens and should not be used as a sole basis for treatment. Nasal washings and aspirates are unacceptable for Xpert Xpress  SARS-CoV-2/FLU/RSV testing.  Fact Sheet for Patients: EntrepreneurPulse.com.au  Fact Sheet for Healthcare Providers: IncredibleEmployment.be  This test is not yet approved or cleared by the [place] FDA and has been authorized for detection and/or diagnosis of SARS-CoV-2 by FDA under an Emergency Use Authorization (EUA). This EUA will remain in effect (meaning this test can be used) for the duration of the COVID-19 declaration under Section 564(b)(1) of the Act, 21 U.S.C. section 360bbb-3(b)(1), unless the authorization is terminated or revoked.     Resp Syncytial Virus by PCR NEGATIVE NEGATIVE Final    Comment: (NOTE) Fact Sheet for Patients: EntrepreneurPulse.com.au  Fact Sheet for Healthcare Providers: IncredibleEmployment.be  This test is not yet approved or cleared by the [place] FDA and has been authorized for detection and/or diagnosis of SARS-CoV-2 by FDA under an Emergency Use Authorization (EUA). This EUA will remain in effect (meaning this test can be used) for the duration of the COVID-19 declaration under Section 564(b)(1) of the Act, 21 U.S.C. section 360bbb-3(b)(1), unless the authorization is terminated or revoked.  Performed at [facility], [address] [place], [place] [address]   MRSA Next Gen by PCR, Nasal     Status: None   Collection Time: 06/27/22  8:30 PM   Specimen: Nasal Mucosa; Nasal Swab  Result Value Ref Range Status   MRSA by PCR Next Gen NOT DETECTED NOT DETECTED Final    Comment: (NOTE) The GeneXpert MRSA Assay (FDA approved for NASAL specimens only), is one component of a comprehensive MRSA colonization surveillance program. It is not intended to diagnose MRSA infection nor to guide or monitor treatment for MRSA infections. Test performance is not FDA approved in patients less than 47 years old. Performed at [facility], [address], [place],  [place] [address]      Time coordinating discharge: 35 minutes  SIGNED:   Rodena Goldmann, DO [facility] 06/29/2022, 2:02 PM  If 7PM-7AM, please contact night-coverage [email or web address]

## 2022-06-30 ENCOUNTER — Telehealth: Payer: Self-pay | Admitting: *Deleted

## 2022-06-30 ENCOUNTER — Encounter: Payer: Self-pay | Admitting: *Deleted

## 2022-06-30 NOTE — Progress Notes (Signed)
  Care Coordination  Note  06/30/2022 Name: Amber Stephenson MRN: [id number] DOB: 06-14-1945  Amber Stephenson is a 77 y.o. year old primary care patient of Stacks, Cletus Gash, MD. I reached out to Dimas Aguas by phone today to assist with scheduling a follow up appointment. Amber Stephenson verbally consented to my assistance.       Follow up plan: Hospital Follow Up appointment scheduled with Dr. Livia Snellen on 07/02/22 at 11:05 AM.  [place]  Direct Dial: [phone]

## 2022-06-30 NOTE — Patient Outreach (Signed)
  Care Coordination TOC Note Transition Care Management Follow-up Telephone Call Date of discharge and from where: [facility] 06/29/22 How have you been since you were released from the hospital? Per husband, patient is doing well. She has not had any swelling or change in physical function.  Any questions or concerns? No  Items Reviewed: Did the pt receive and understand the discharge instructions provided? Yes  Medications obtained and verified? Yes . Reviewed changes and reconciled.  Other? Yes . Need to reschedule appt with heart failure clinic. Appt for 2/2 was cancelled this morning by patient. Discussed importance of daily weights and use of extra dose of lasix '20mg'$  for any weight gain >3lb overnight or >5 lbs in one week, edema, or SOB Any new allergies since your discharge? No  Dietary orders reviewed? Yes. Heart healthy/carb modified Do you have support at home? Yes . Husband, [facility] and Equipment/Supplies: Were home health services ordered? yes If so, what is the name of the agency? [facility]  Has the agency set up a time to come to the patient's home? yes Were any new equipment or medical supplies ordered?  No What is the name of the medical supply agency?  Were you able to get the supplies/equipment? not applicable Do you have any questions related to the use of the equipment or supplies? No  Functional Questionnaire: (I = Independent and D = Dependent) ADLs: I  Bathing/Dressing- I  Meal Prep- I  Eating- I  Maintaining continence- I  Transferring/Ambulation- I  Managing Meds- D. Husband prepares meds and patient takes them  Follow up appointments reviewed:  PCP Hospital f/u appt confirmed? No   Specialist Hospital f/u appt confirmed? Yes  Scheduled to see Dr Lake Bells (pulmoary) on 07/10/22 at 1:00. Patient cancelled appt with heart failure team on 07/04/22. Husband to explore reasons why and reschedule.  Are transportation arrangements needed? No  If their  condition worsens, is the pt aware to call PCP or go to the Emergency Dept.? Yes Was the patient provided with contact information for the PCP's office or ED? Yes Was to pt encouraged to call back with questions or concerns? Yes  SDOH assessments and interventions completed:   Yes SDOH Interventions Today    Flowsheet Row Most Recent Value  SDOH Interventions   Transportation Interventions Intervention Not Indicated  Financial Strain Interventions Intervention Not Indicated       Care Coordination Interventions:  PCP follow up appointment requested Other interventions listed above    Encounter Outcome:  Pt. Visit Completed    Chong Sicilian, BSN, RN-BC RN Care Coordinator [place]: [phone] Main #: [phone]

## 2022-07-01 DIAGNOSIS — M542 Cervicalgia: Secondary | ICD-10-CM | POA: Diagnosis not present

## 2022-07-01 DIAGNOSIS — M5440 Lumbago with sciatica, unspecified side: Secondary | ICD-10-CM | POA: Diagnosis not present

## 2022-07-01 DIAGNOSIS — Z6835 Body mass index (BMI) 35.0-35.9, adult: Secondary | ICD-10-CM | POA: Diagnosis not present

## 2022-07-01 LAB — LEGIONELLA PNEUMOPHILA SEROGP 1 UR AG: L. pneumophila Serogp 1 Ur Ag: NEGATIVE

## 2022-07-02 ENCOUNTER — Ambulatory Visit (INDEPENDENT_AMBULATORY_CARE_PROVIDER_SITE_OTHER): Payer: Medicare Other | Admitting: Family Medicine

## 2022-07-02 ENCOUNTER — Encounter: Payer: Self-pay | Admitting: Family Medicine

## 2022-07-02 VITALS — BP 108/73 | HR 90 | Temp 97.5°F | Ht 62.0 in | Wt 197.0 lb

## 2022-07-02 DIAGNOSIS — I5043 Acute on chronic combined systolic (congestive) and diastolic (congestive) heart failure: Secondary | ICD-10-CM

## 2022-07-02 DIAGNOSIS — I959 Hypotension, unspecified: Secondary | ICD-10-CM | POA: Diagnosis not present

## 2022-07-02 LAB — CULTURE, BLOOD (ROUTINE X 2)
Culture: NO GROWTH
Culture: NO GROWTH
Special Requests: ADEQUATE

## 2022-07-02 NOTE — Progress Notes (Signed)
Subjective:  Patient ID: Amber Stephenson, female    DOB: 12/03/1945  Age: 77 y.o. MRN: [id number]  CC: Hospitalization Follow-up   HPI [name] presents for hospitatl admission for CHF and shock from 1/26/to 06/29/22. Now swelling is gone. Taking lasix 20 mg BID plus 20 mg prn weight gain of 3 lb. Also had concern for sepsis in hospital. Husband concerned about her potassium. Not sure of dose. Has pills not sure of hte strength of them. Pt. Currently not dyspneic or swollen.      07/02/2022   11:10 AM 06/18/2022   10:30 AM 04/17/2022    2:29 PM  Depression screen PHQ 2/9  Decreased Interest 0 0 0  Down, Depressed, Hopeless 0 0 0  PHQ - 2 Score 0 0 0  Altered sleeping   3  Tired, decreased energy   3  Change in appetite   0  Feeling bad or failure about yourself    1  Trouble concentrating   3  Moving slowly or fidgety/restless   1  Suicidal thoughts   0  PHQ-9 Score   11    History Azlyn has a past medical history of Acute exacerbation of CHF (congestive heart failure) ([place]) (12/13/2020), Acute on chronic diastolic CHF (congestive heart failure)/HFpEF Exacerbation (12/16/2020), Acute on chronic respiratory failure with hypoxia ([place]) (01/22/2019), Acute respiratory failure with hypoxia ([place]) (12/16/2020), Asthma, Atrial fibrillation with RVR (05/19/2017), Bipolar I disorder (01/23/2015), CAD (coronary artery disease), Cardiomegaly (01/12/2018), Chronic anticoagulation (05/30/2015), Chronic atrial fibrillation ([place]) (01/04/2015), Chronic back pain, Chronic diastolic CHF (congestive heart failure) (05/30/2015), Diabetes mellitus, type 2, without complication, Diabetic neuropathy (02/06/2016), Dysrhythmia, Essential hypertension, Herpes genitalis in women (07/16/2015), Hyperlipidemia, Hypoxia (02/01/2019), Insomnia (01/23/2015), Lipoma (02/08/2015), Major depressive disorder, Migraine headache with aura (02/12/2016), 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04/21/2019), NAFL  (nonalcoholic fatty liver) (83/66/2947), OSA (obstructive sleep apnea) (02/24/2019), Pulmonary hypertension, Renal insufficiency, and RLS (restless legs syndrome) (04/27/2015).   She has a past surgical history that includes THIGH SURGERY; Shoulder surgery (Right); Breast reduction surgery; Eye surgery (Right); Hammer toe surgery; LEFT HEART CATH AND CORONARY ANGIOGRAPHY (N/A, 02/03/2018); and Reverse shoulder arthroplasty (Right, 08/19/2019).   Her family history includes Alcohol abuse in her sister; Alzheimer's disease in her father; Diabetes in her brother, mother, and sister; Heart disease in her brother, mother, and sister; Mental illness in her brother; Stroke in her brother.She reports that she has never smoked. She has never used smokeless tobacco. She reports that she does not currently use alcohol. She reports that she does not use drugs.    ROS Review of Systems  Constitutional: Negative.   HENT: Negative.    Eyes:  Negative for visual disturbance.  Respiratory:  Negative for shortness of breath.   Cardiovascular:  Negative for chest pain.  Gastrointestinal:  Negative for abdominal pain.  Musculoskeletal:  Negative for arthralgias.    Objective:  BP 108/73   Pulse 90   Temp (!) 97.5 F (36.4 C)   Ht '5\' 2"'$  (1.575 m)   Wt 197 lb (89.4 kg)   SpO2 92%   BMI 36.03 kg/m   BP Readings from Last 3 Encounters:  07/02/22 108/73  06/29/22 130/69  06/24/22 128/76    Wt Readings from Last 3 Encounters:  07/02/22 197 lb (89.4 kg)  06/29/22 198 lb 10.2 oz (90.1 kg)  06/24/22 192 lb (87.1 kg)     Physical Exam Constitutional:      General: She is not in acute distress.  Appearance: She is well-developed.  Cardiovascular:     Rate and Rhythm: Normal rate and regular rhythm.  Pulmonary:     Breath sounds: Normal breath sounds.  Musculoskeletal:        General: Normal range of motion.  Skin:    General: Skin is warm and dry.  Neurological:     Mental Status: She is  alert and oriented to person, place, and time.       Assessment & Plan:   Paylin was seen today for hospitalization follow-up.  Diagnoses and all orders for this visit:  Acute on chronic combined systolic and diastolic congestive heart failure (HCC) -     CBC with Differential/Platelet -     Brain natriuretic peptide -     CMP14+EGFR  Hypotension, unspecified hypotension type       I am having Helene Kelp D. Birenbaum maintain her Vitamin D (Cholecalciferol), albuterol, albuterol, Fluticasone-Salmeterol, LORazepam, Prolia, naloxone, escitalopram, ARIPiprazole, metoprolol succinate, metoprolol succinate, benztropine, nystatin ointment, ondansetron, lisinopril, potassium chloride SA, pregabalin, hydrOXYzine, atorvastatin, Dexcom G6 Transmitter, Emgality, Dexcom G7 Sensor, icosapent Ethyl, insulin lispro, metFORMIN, Insulin Pen Needle, Lantus SoloStar, Acetaminophen (TYLENOL PO), senna-docusate, OXcarbazepine, nystatin, Mounjaro, fluconazole, morphine, furosemide, and apixaban.  Allergies as of 07/02/2022       Reactions   Iodine Anaphylaxis   Ivp Dye [iodinated Contrast Media] Anaphylaxis, Swelling   Throat closes   Latex Other (See Comments)   Latex tape pulls skin with it   Tizanidine Other (See Comments)   Weakness, goofy, bad dreams        Medication List        Accurate as of July 02, 2022 11:59 PM. If you have any questions, ask your nurse or doctor.          albuterol (2.5 MG/3ML) 0.083% nebulizer solution Commonly known as: PROVENTIL Take 3 mLs (2.5 mg total) by nebulization every 6 (six) hours as needed for wheezing or shortness of breath. What changed: when to take this   albuterol 108 (90 Base) MCG/ACT inhaler Commonly known as: VENTOLIN HFA Inhale 2 puffs into the lungs every 6 (six) hours as needed for wheezing or shortness of breath. What changed: when to take this   apixaban 5 MG Tabs tablet Commonly known as: Eliquis Take 1 tablet (5 mg total)  by mouth 2 (two) times daily.   ARIPiprazole 2 MG tablet Commonly known as: ABILIFY Take 2 mg by mouth at bedtime.   atorvastatin 40 MG tablet Commonly known as: LIPITOR TAKE 1 TABLET(40 MG) BY MOUTH DAILY What changed:  how much to take how to take this when to take this additional instructions   benztropine 1 MG tablet Commonly known as: COGENTIN Take 1 tablet (1 mg total) by mouth 2 (two) times daily.   Dexcom G6 Transmitter Misc Use as instructed to check blood sugar. Change every 90 days   Dexcom G7 Sensor Misc Change sensor every 10 days   Emgality 120 MG/ML Soaj Generic drug: Galcanezumab-gnlm One injection per month What changed:  how much to take how to take this when to take this additional instructions   escitalopram 10 MG tablet Commonly known as: LEXAPRO Take 10 mg by mouth daily.   fluconazole 150 MG tablet Commonly known as: DIFLUCAN Take 1 now and can repeat 1 in 3 days   Fluticasone-Salmeterol 500-50 MCG/DOSE Aepb Commonly known as: Advair Diskus Inhale 1 puff into the lungs in the morning and at bedtime. What changed:  when to take this reasons  to take this   furosemide 40 MG tablet Commonly known as: LASIX Take 0.5 tablets (20 mg total) by mouth 2 (two) times daily. Take extra '20mg'$  in am if dyspnea, weight gain of 3 lbs or greater, or edema.   hydrOXYzine 25 MG capsule Commonly known as: VISTARIL Take 1 capsule (25 mg total) by mouth every 8 (eight) hours as needed.   icosapent Ethyl 1 g capsule Commonly known as: VASCEPA TAKE 2 CAPSULES TWICE A DAY WITH MEALS What changed: See the new instructions.   insulin lispro 100 UNIT/ML KwikPen Commonly known as: HumaLOG KwikPen Max daily 50 units What changed:  how much to take how to take this when to take this additional instructions   Insulin Pen Needle 29G X 12MM Misc 1 Device by Does not apply route daily in the afternoon.   Lantus SoloStar 100 UNIT/ML Solostar Pen Generic  drug: insulin glargine Inject 68 Units into the skin daily. What changed:  how much to take when to take this   lisinopril 10 MG tablet Commonly known as: ZESTRIL Take 1 tablet (10 mg total) by mouth daily.   LORazepam 1 MG tablet Commonly known as: ATIVAN Take 1 tablet (1 mg total) by mouth 2 (two) times daily. And give 2 tablets by mouth at bedtime What changed:  how much to take additional instructions   metFORMIN 500 MG tablet Commonly known as: GLUCOPHAGE Take 1 tablet (500 mg total) by mouth 2 (two) times daily with a meal. TAKE 1 TABLET(500 MG) BY MOUTH TWICE DAILY AFTER A MEAL What changed: additional instructions   metoprolol succinate 25 MG 24 hr tablet Commonly known as: Toprol XL Take 1 tablet (25 mg total) by mouth in the morning and at bedtime. What changed: additional instructions   metoprolol succinate 100 MG 24 hr tablet Commonly known as: TOPROL-XL TAKE 1 TABLET IN THE MORNING AND AT BEDTIME What changed:  how much to take how to take this when to take this additional instructions   morphine 15 MG 12 hr tablet Commonly known as: MS Contin Take 1 tablet (15 mg total) by mouth every 12 (twelve) hours. This is a long acting form, so take 1 tab 2x/day to keep pain more even- it is NOT a short acting pain med like you were taking- for chronic pain   Mounjaro 5 MG/0.5ML Pen Generic drug: tirzepatide Inject 5 mg into the skin once a week.   naloxone 4 MG/0.1ML Liqd nasal spray kit Commonly known as: NARCAN Place 1 spray into the nose once.   nystatin ointment Commonly known as: MYCOSTATIN Apply 1 Application topically 2 (two) times daily. What changed:  when to take this reasons to take this   nystatin powder Commonly known as: MYCOSTATIN/NYSTOP Apply 1 Application topically as needed (rash). What changed: Another medication with the same name was changed. Make sure you understand how and when to take each.   ondansetron 8 MG disintegrating  tablet Commonly known as: ZOFRAN-ODT Take 1 tablet (8 mg total) by mouth every 6 (six) hours as needed for nausea or vomiting.   OXcarbazepine 150 MG tablet Commonly known as: TRILEPTAL Take 1 tablet (150 mg total) by mouth 2 (two) times daily.   potassium chloride SA 20 MEQ tablet Commonly known as: KLOR-CON M Take 1 tablet (20 mEq total) by mouth daily. What changed:  how much to take when to take this   pregabalin 200 MG capsule Commonly known as: LYRICA TAKE 1 CAPSULE(200 MG) BY MOUTH  TWICE DAILY What changed: See the new instructions.   Prolia 60 MG/ML Sosy injection Generic drug: denosumab Inject 60 mg into the skin every 6 (six) months.   senna-docusate 8.6-50 MG tablet Commonly known as: Senokot-S Take 1 tablet by mouth at bedtime.   TYLENOL PO Take 2 tablets by mouth as needed (pain/headache).   Vitamin D (Cholecalciferol) 25 MCG (1000 UT) Tabs Take 1,000 Units by mouth daily in the afternoon.         Follow-up: No follow-ups on file.  [name], M.D.

## 2022-07-02 NOTE — Progress Notes (Signed)
Hello [place],  Your lab result is normal and/or stable.Some minor variations that are not significant are commonly marked abnormal, but do not represent any medical problem for you.  Best regards, [name], M.D.

## 2022-07-03 ENCOUNTER — Ambulatory Visit: Payer: Medicare Other | Admitting: Adult Health

## 2022-07-03 ENCOUNTER — Ambulatory Visit: Payer: Medicare Other | Admitting: Nurse Practitioner

## 2022-07-03 LAB — CBC WITH DIFFERENTIAL/PLATELET
Basophils Absolute: 0.1 10*3/uL (ref 0.0–0.2)
Basos: 0 %
EOS (ABSOLUTE): 0.4 10*3/uL (ref 0.0–0.4)
Eos: 3 %
Hematocrit: 41.8 % (ref 34.0–46.6)
Hemoglobin: 13.8 g/dL (ref 11.1–15.9)
Immature Grans (Abs): 0.1 10*3/uL (ref 0.0–0.1)
Immature Granulocytes: 1 %
Lymphocytes Absolute: 3.7 10*3/uL — ABNORMAL HIGH (ref 0.7–3.1)
Lymphs: 32 %
MCH: 30 pg (ref 26.6–33.0)
MCHC: 33 g/dL (ref 31.5–35.7)
MCV: 91 fL (ref 79–97)
Monocytes Absolute: 0.7 10*3/uL (ref 0.1–0.9)
Monocytes: 6 %
Neutrophils Absolute: 6.7 10*3/uL (ref 1.4–7.0)
Neutrophils: 58 %
Platelets: 185 10*3/uL (ref 150–450)
RBC: 4.6 x10E6/uL (ref 3.77–5.28)
RDW: 12.5 % (ref 11.7–15.4)
WBC: 11.6 10*3/uL — ABNORMAL HIGH (ref 3.4–10.8)

## 2022-07-03 LAB — CMP14+EGFR
ALT: 15 IU/L (ref 0–32)
AST: 25 IU/L (ref 0–40)
Albumin/Globulin Ratio: 1.5 (ref 1.2–2.2)
Albumin: 4.1 g/dL (ref 3.8–4.8)
Alkaline Phosphatase: 111 IU/L (ref 44–121)
BUN/Creatinine Ratio: 13 (ref 12–28)
BUN: 13 mg/dL (ref 8–27)
Bilirubin Total: 0.4 mg/dL (ref 0.0–1.2)
CO2: 23 mmol/L (ref 20–29)
Calcium: 8.4 mg/dL — ABNORMAL LOW (ref 8.7–10.3)
Chloride: 97 mmol/L (ref 96–106)
Creatinine, Ser: 1.04 mg/dL — ABNORMAL HIGH (ref 0.57–1.00)
Globulin, Total: 2.8 g/dL (ref 1.5–4.5)
Glucose: 242 mg/dL — ABNORMAL HIGH (ref 70–99)
Potassium: 4.8 mmol/L (ref 3.5–5.2)
Sodium: 137 mmol/L (ref 134–144)
Total Protein: 6.9 g/dL (ref 6.0–8.5)
eGFR: 56 mL/min/{1.73_m2} — ABNORMAL LOW (ref >59)

## 2022-07-03 LAB — BRAIN NATRIURETIC PEPTIDE: BNP: 97.8 pg/mL (ref 0.0–100.0)

## 2022-07-04 ENCOUNTER — Ambulatory Visit: Payer: Medicare Other | Admitting: Nurse Practitioner

## 2022-07-04 DIAGNOSIS — J9621 Acute and chronic respiratory failure with hypoxia: Secondary | ICD-10-CM | POA: Diagnosis not present

## 2022-07-04 DIAGNOSIS — I272 Pulmonary hypertension, unspecified: Secondary | ICD-10-CM | POA: Diagnosis not present

## 2022-07-04 DIAGNOSIS — I482 Chronic atrial fibrillation, unspecified: Secondary | ICD-10-CM | POA: Diagnosis not present

## 2022-07-04 DIAGNOSIS — I5033 Acute on chronic diastolic (congestive) heart failure: Secondary | ICD-10-CM | POA: Diagnosis not present

## 2022-07-04 DIAGNOSIS — I2510 Atherosclerotic heart disease of native coronary artery without angina pectoris: Secondary | ICD-10-CM | POA: Diagnosis not present

## 2022-07-04 DIAGNOSIS — I110 Hypertensive heart disease with heart failure: Secondary | ICD-10-CM | POA: Diagnosis not present

## 2022-07-06 ENCOUNTER — Encounter: Payer: Self-pay | Admitting: Family Medicine

## 2022-07-07 DIAGNOSIS — R809 Proteinuria, unspecified: Secondary | ICD-10-CM | POA: Diagnosis not present

## 2022-07-07 DIAGNOSIS — J9621 Acute and chronic respiratory failure with hypoxia: Secondary | ICD-10-CM | POA: Diagnosis not present

## 2022-07-07 DIAGNOSIS — N189 Chronic kidney disease, unspecified: Secondary | ICD-10-CM | POA: Diagnosis not present

## 2022-07-07 DIAGNOSIS — I129 Hypertensive chronic kidney disease with stage 1 through stage 4 chronic kidney disease, or unspecified chronic kidney disease: Secondary | ICD-10-CM | POA: Diagnosis not present

## 2022-07-07 DIAGNOSIS — Z6837 Body mass index (BMI) 37.0-37.9, adult: Secondary | ICD-10-CM | POA: Diagnosis not present

## 2022-07-07 DIAGNOSIS — I5033 Acute on chronic diastolic (congestive) heart failure: Secondary | ICD-10-CM | POA: Diagnosis not present

## 2022-07-07 DIAGNOSIS — I482 Chronic atrial fibrillation, unspecified: Secondary | ICD-10-CM | POA: Diagnosis not present

## 2022-07-07 DIAGNOSIS — E1122 Type 2 diabetes mellitus with diabetic chronic kidney disease: Secondary | ICD-10-CM | POA: Diagnosis not present

## 2022-07-07 DIAGNOSIS — I272 Pulmonary hypertension, unspecified: Secondary | ICD-10-CM | POA: Diagnosis not present

## 2022-07-07 DIAGNOSIS — I110 Hypertensive heart disease with heart failure: Secondary | ICD-10-CM | POA: Diagnosis not present

## 2022-07-07 DIAGNOSIS — E1129 Type 2 diabetes mellitus with other diabetic kidney complication: Secondary | ICD-10-CM | POA: Diagnosis not present

## 2022-07-07 DIAGNOSIS — E876 Hypokalemia: Secondary | ICD-10-CM | POA: Diagnosis not present

## 2022-07-07 DIAGNOSIS — I2510 Atherosclerotic heart disease of native coronary artery without angina pectoris: Secondary | ICD-10-CM | POA: Diagnosis not present

## 2022-07-08 DIAGNOSIS — E1142 Type 2 diabetes mellitus with diabetic polyneuropathy: Secondary | ICD-10-CM | POA: Diagnosis not present

## 2022-07-08 DIAGNOSIS — L84 Corns and callosities: Secondary | ICD-10-CM | POA: Diagnosis not present

## 2022-07-08 DIAGNOSIS — B351 Tinea unguium: Secondary | ICD-10-CM | POA: Diagnosis not present

## 2022-07-08 DIAGNOSIS — M79676 Pain in unspecified toe(s): Secondary | ICD-10-CM | POA: Diagnosis not present

## 2022-07-10 ENCOUNTER — Encounter: Payer: Self-pay | Admitting: Pulmonary Disease

## 2022-07-10 ENCOUNTER — Ambulatory Visit (INDEPENDENT_AMBULATORY_CARE_PROVIDER_SITE_OTHER): Payer: Medicare Other | Admitting: Pulmonary Disease

## 2022-07-10 VITALS — BP 138/90 | HR 90 | Temp 97.7°F | Ht 62.0 in | Wt 195.6 lb

## 2022-07-10 DIAGNOSIS — I5032 Chronic diastolic (congestive) heart failure: Secondary | ICD-10-CM

## 2022-07-10 DIAGNOSIS — I272 Pulmonary hypertension, unspecified: Secondary | ICD-10-CM

## 2022-07-10 DIAGNOSIS — J9611 Chronic respiratory failure with hypoxia: Secondary | ICD-10-CM | POA: Diagnosis not present

## 2022-07-10 DIAGNOSIS — G4733 Obstructive sleep apnea (adult) (pediatric): Secondary | ICD-10-CM | POA: Diagnosis not present

## 2022-07-10 DIAGNOSIS — J4520 Mild intermittent asthma, uncomplicated: Secondary | ICD-10-CM | POA: Diagnosis not present

## 2022-07-10 NOTE — Patient Instructions (Signed)
Chronic respiratory failure with hypoxemia: For now continue using oxygen when asleep Will set up CPAP, see below Use 2 L of oxygen when you exert yourself  Heart failure with preserved ejection fraction: Keep follow-up with cardiology Keep taking furosemide as you are doing Keep sodium intake less than 2 g a day Keep fluid intake less than 2 L a day  Pulmonary hypertension: Repeat pulmonary function test to make sure there is no evidence of a new lung problem You need to use oxygen when you exert yourself to help with this He need to have your obstructive sleep apnea treated right away as this is the predominant cause for your pulmonary hypertension  Obstructive sleep apnea, severe: Start using CPAP at night, we will prescribe ResMed 11 auto titrating machine 5 to 20 cm of water Request a nose mask when the machine is brought to you If you have problems with the mask we can always refer you to a mask fitting class No driving while sleepy  We will see you back in 4 to 6 weeks or sooner if needed.

## 2022-07-10 NOTE — Progress Notes (Signed)
Synopsis: Referred in July 2019 for evaluation of shortness of breath, had a presumptive diagnosis of asthma.  Has severe OSA, HFpEF. Referred back in 2024  Subjective:   PATIENT ID: Amber Stephenson GENDER: female DOB: December 05, 1945, MRN: [id number]   HPI  Chief Complaint  Patient presents with   Follow-up    1/17 cxr,     Amber Stephenson has been experiencing more in the nights and mornings.  Coughing some with no mucus production.  It's beening going on for a months. Her husband says taht she shopped taking her furosemide for a few days and she ended up going into the hospital.  She went home taking furosemide and then had to come back with low blood pressure and AKI.  Given fluids and improved.  Discharged on oxygen.  No bronchitis, no pneumonia  She has been seeing a heart doctor, no recent heart issues  Weight is up 5-7 pounds in the last few weeks, there is some descrepancy with what she and her husband say about this Taking furosemide regularly   January 2024 hospitalization records reviewed.  The patient was seen by pulmonology/critical care on admission in the context of hypotension and confusion.  Pulmonary critical care felt the patient was not septic but had been over diuresed with Lasix and recommended IV fluids for acute kidney injury and hypotension.  Past Medical History:  Diagnosis Date   Acute exacerbation of CHF (congestive heart failure) ([place]) 12/13/2020   Acute on chronic diastolic CHF (congestive heart failure)/HFpEF Exacerbation 12/16/2020   Acute on chronic respiratory failure with hypoxia ([place]) 01/22/2019   Acute respiratory failure with hypoxia ([place]) 12/16/2020   Asthma    Atrial fibrillation with RVR 05/19/2017   Bipolar I disorder 01/23/2015   CAD (coronary artery disease)    Nonobstructive; Managed by Dr. 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 Wallace Keller   RLS (restless legs syndrome) 04/27/2015     Family History  Problem Relation Age of Onset   Alzheimer's disease Father    Diabetes Mother    Heart disease Mother    Stroke Brother    Heart disease Brother    Mental illness Brother    Diabetes Brother    Heart disease Sister        CABG   Diabetes Sister    Alcohol abuse Sister      Social History   Socioeconomic History   Marital status: Married    Spouse name: Orpah Greek   Number of children: 3   Years of education: 15   Highest education level: Bachelor's degree (e.g., BA, AB, BS)  Occupational History   Occupation: Retired    Comment: marketing  Tobacco Use  Smoking status: Never   Smokeless tobacco: Never  Vaping Use   Vaping Use: Never used  Substance and Sexual Activity   Alcohol use: Not Currently    Alcohol/week: 0.0 standard drinks of alcohol   Drug use: No   Sexual activity: Yes    Partners: Male    Birth control/protection: Post-menopausal  Other Topics Concern   Not on file  Social History Narrative   Lives at home with husband.    They have 3 children who live away - [place], [place], and [place].   She is from [place] and most of her family lives there.   Her husbands's family live nearby   Right handed.   Social Determinants of Health   Financial Resource  Strain: Low Risk  (06/30/2022)   Overall Financial Resource Strain (CARDIA)    Difficulty of Paying Living Expenses: Not hard at all  Food Insecurity: No Food Insecurity (06/28/2022)   Hunger Vital Sign    Worried About Running Out of Food in the Last Year: Never true    Ran Out of Food in the Last Year: Never true  Transportation Needs: No Transportation Needs (06/30/2022)   PRAPARE - Hydrologist (Medical): No    Lack of Transportation (Non-Medical): No  Physical Activity: Inactive (12/13/2021)   Exercise Vital Sign    Days of Exercise per Week: 0 days    Minutes of Exercise per Session: 0 min  Stress: No Stress Concern Present (12/13/2021)   [place]    Feeling of Stress : Not at all  Social Connections: [place] (12/13/2021)   Social Connection and Isolation Panel [NHANES]    Frequency of Communication with Friends and Family: More than three times a week    Frequency of Social Gatherings with Friends and Family: Twice a week    Attends Religious Services: More than 4 times per year    Active Member of [employer] or Organizations: Yes    Attends Music therapist: More than 4 times per year    Marital Status: Married  Human resources officer Violence: Not At Risk (06/28/2022)   Humiliation, Afraid, Rape, and Kick questionnaire    Fear of Current or Ex-Partner: No    Emotionally Abused: No    Physically Abused: No    Sexually Abused: No     Allergies  Allergen Reactions   Iodine Anaphylaxis   Ivp Dye [Iodinated Contrast Media] Anaphylaxis and Swelling    Throat closes    Latex Other (See Comments)    Latex tape pulls skin with it   Tizanidine Other (See Comments)    Weakness, goofy, bad dreams     Outpatient Medications Prior to Visit  Medication Sig Dispense Refill   Acetaminophen (TYLENOL PO) Take 2 tablets by mouth as needed (pain/headache).     albuterol  (PROVENTIL) (2.5 MG/3ML) 0.083% nebulizer solution Take 3 mLs (2.5 mg total) by nebulization every 6 (six) hours as needed for wheezing or shortness of breath. (Patient taking differently: Take 2.5 mg by nebulization as needed for wheezing or shortness of breath.) 150 mL 0   albuterol (VENTOLIN HFA) 108 (90 Base) MCG/ACT inhaler Inhale 2 puffs into the lungs every 6 (six) hours as needed for wheezing or shortness of breath. (Patient taking differently: Inhale 2 puffs into the lungs as needed for wheezing or shortness of breath.) 1 each 0   apixaban (ELIQUIS) 5 MG TABS tablet Take 1 tablet (  5 mg total) by mouth 2 (two) times daily. 60 tablet 0   ARIPiprazole (ABILIFY) 2 MG tablet Take 2 mg by mouth at bedtime.     atorvastatin (LIPITOR) 40 MG tablet TAKE 1 TABLET(40 MG) BY MOUTH DAILY (Patient taking differently: Take 40 mg by mouth daily.) 90 tablet 0   benztropine (COGENTIN) 1 MG tablet Take 1 tablet (1 mg total) by mouth 2 (two) times daily. 60 tablet 2   Continuous Blood Gluc Sensor (DEXCOM G7 SENSOR) MISC Change sensor every 10 days 9 each 3   Continuous Blood Gluc Transmit (DEXCOM G6 TRANSMITTER) MISC Use as instructed to check blood sugar. Change every 90 days 1 each 1   denosumab (PROLIA) 60 MG/ML SOSY injection Inject 60 mg into the skin every 6 (six) months. 1 mL 1   escitalopram (LEXAPRO) 10 MG tablet Take 10 mg by mouth daily.     fluconazole (DIFLUCAN) 150 MG tablet Take 1 now and can repeat 1 in 3 days 2 tablet 0   Fluticasone-Salmeterol (ADVAIR DISKUS) 500-50 MCG/DOSE AEPB Inhale 1 puff into the lungs in the morning and at bedtime. (Patient taking differently: Inhale 1 puff into the lungs as needed (SOB).) 60 each 0   furosemide (LASIX) 40 MG tablet Take 0.5 tablets (20 mg total) by mouth 2 (two) times daily. Take extra '20mg'$  in am if dyspnea, weight gain of 3 lbs or greater, or edema. 30 tablet 0   Galcanezumab-gnlm (EMGALITY) 120 MG/ML SOAJ One injection per month (Patient taking  differently: Inject 120 mg into the skin every 30 (thirty) days.) 1 mL 0   hydrOXYzine (VISTARIL) 25 MG capsule Take 1 capsule (25 mg total) by mouth every 8 (eight) hours as needed. 90 capsule 1   icosapent Ethyl (VASCEPA) 1 g capsule TAKE 2 CAPSULES TWICE A DAY WITH MEALS (Patient taking differently: Take 2 g by mouth 2 (two) times daily.) 360 capsule 0   insulin glargine (LANTUS SOLOSTAR) 100 UNIT/ML Solostar Pen Inject 68 Units into the skin daily. (Patient taking differently: Inject 34 Units into the skin 2 (two) times daily.) 75 mL 3   insulin lispro (HUMALOG KWIKPEN) 100 UNIT/ML KwikPen Max daily 50 units (Patient taking differently: Inject 4-8 Units into the skin 3 (three) times daily.) 45 mL 6   Insulin Pen Needle 29G X 12MM MISC 1 Device by Does not apply route daily in the afternoon. 400 each 3   lisinopril (ZESTRIL) 10 MG tablet Take 1 tablet (10 mg total) by mouth daily. 90 tablet 3   LORazepam (ATIVAN) 1 MG tablet Take 1 tablet (1 mg total) by mouth 2 (two) times daily. And give 2 tablets by mouth at bedtime (Patient taking differently: Take 1-2 mg by mouth 2 (two) times daily. Take 2 mg by mouth in the morning and 1 mg by mouth at bedtime) 90 tablet 0   metFORMIN (GLUCOPHAGE) 500 MG tablet Take 1 tablet (500 mg total) by mouth 2 (two) times daily with a meal. TAKE 1 TABLET(500 MG) BY MOUTH TWICE DAILY AFTER A MEAL (Patient taking differently: Take 500 mg by mouth 2 (two) times daily with a meal.) 180 tablet 3   metoprolol succinate (TOPROL XL) 25 MG 24 hr tablet Take 1 tablet (25 mg total) by mouth in the morning and at bedtime. (Patient taking differently: Take 25 mg by mouth in the morning and at bedtime. Take with 100 mg tablet twice daily) 180 tablet 3   metoprolol succinate (TOPROL-XL) 100 MG 24 hr  tablet TAKE 1 TABLET IN THE MORNING AND AT BEDTIME (Patient taking differently: Take 100 mg by mouth in the morning and at bedtime. Take with 25 mg tablet twice daily.) 180 tablet 3    morphine (MS CONTIN) 15 MG 12 hr tablet Take 1 tablet (15 mg total) by mouth every 12 (twelve) hours. This is a long acting form, so take 1 tab 2x/day to keep pain more even- it is NOT a short acting pain med like you were taking- for chronic pain 60 tablet 0   naloxone (NARCAN) nasal spray 4 mg/0.1 mL Place 1 spray into the nose once.     nystatin (MYCOSTATIN/NYSTOP) powder Apply 1 Application topically as needed (rash). 15 g 2   nystatin ointment (MYCOSTATIN) Apply 1 Application topically 2 (two) times daily. (Patient taking differently: Apply 1 Application topically as needed (rash).) 30 g 3   ondansetron (ZOFRAN-ODT) 8 MG disintegrating tablet Take 1 tablet (8 mg total) by mouth every 6 (six) hours as needed for nausea or vomiting. 20 tablet 1   OXcarbazepine (TRILEPTAL) 150 MG tablet Take 1 tablet (150 mg total) by mouth 2 (two) times daily. 60 tablet 0   potassium chloride SA (KLOR-CON M) 20 MEQ tablet Take 1 tablet (20 mEq total) by mouth daily. (Patient taking differently: Take 10 mEq by mouth at bedtime.) 30 tablet 5   pregabalin (LYRICA) 200 MG capsule TAKE 1 CAPSULE(200 MG) BY MOUTH TWICE DAILY (Patient taking differently: Take 200 mg by mouth 2 (two) times daily.) 60 capsule 5   senna-docusate (SENOKOT-S) 8.6-50 MG tablet Take 1 tablet by mouth at bedtime. 30 tablet 0   tirzepatide (MOUNJARO) 5 MG/0.5ML Pen Inject 5 mg into the skin once a week.     Vitamin D, Cholecalciferol, 25 MCG (1000 UT) TABS Take 1,000 Units by mouth daily in the afternoon.     Facility-Administered Medications Prior to Visit  Medication Dose Route Frequency Provider Last Rate Last Admin   bupivacaine-epinephrine (MARCAINE W/ EPI) 0.5% -1:200000 injection    Anesthesia Intra-op Effie Berkshire, MD   12 mL at 01/21/19 0935    Review of Systems  Constitutional:  Positive for malaise/fatigue. Negative for chills, fever and weight loss.  HENT:  Negative for congestion, nosebleeds, sinus pain and sore throat.    Eyes:  Negative for photophobia, pain and discharge.  Respiratory:  Positive for cough, shortness of breath and wheezing. Negative for hemoptysis and sputum production.   Cardiovascular:  Negative for chest pain, palpitations, orthopnea and leg swelling.  Gastrointestinal:  Negative for abdominal pain, constipation, diarrhea, nausea and vomiting.  Genitourinary:  Negative for dysuria, frequency, hematuria and urgency.  Musculoskeletal:  Negative for back pain, joint pain, myalgias and neck pain.  Skin:  Negative for itching and rash.  Neurological:  Negative for tingling, tremors, sensory change, speech change, focal weakness, seizures, weakness and headaches.  Psychiatric/Behavioral:  Negative for memory loss, substance abuse and suicidal ideas. The patient is not nervous/anxious.       Objective:  Physical Exam   Vitals:   07/10/22 1306  BP: (!) 138/90  Pulse: 90  Temp: 97.7 F (36.5 C)  TempSrc: Oral  SpO2: 98%  Weight: 195 lb 9.6 oz (88.7 kg)  Height: '5\' 2"'$  (1.575 m)    Gen: chronically ill appearing, no acute distress, obese HENT: NCAT, OP clear, neck supple without masses Eyes: PERRL, EOMi Lymph: no cervical lymphadenopathy PULM: CTA B CV: RRR, no mgr, no JVD GI: BS+, soft, nontender, no  hsm Derm: no rash or skin breakdown MSK: normal bulk and tone Neuro: A&Ox4, CN II-XII intact, strength 5/5 in all 4 extremities Psyche: normal mood and affect   CBC    Component Value Date/Time   WBC 11.6 (H) 07/02/2022 1155   WBC 6.6 06/29/2022 0519   RBC 4.60 07/02/2022 1155   RBC 3.70 (L) 06/29/2022 0519   HGB 13.8 07/02/2022 1155   HCT 41.8 07/02/2022 1155   PLT 185 07/02/2022 1155   MCV 91 07/02/2022 1155   MCH 30.0 07/02/2022 1155   MCH 30.5 06/29/2022 0519   MCHC 33.0 07/02/2022 1155   MCHC 32.9 06/29/2022 0519   RDW 12.5 07/02/2022 1155   LYMPHSABS 3.7 (H) 07/02/2022 1155   MONOABS 0.7 06/27/2022 1415   EOSABS 0.4 07/02/2022 1155   BASOSABS 0.1 07/02/2022  1155     Chest imaging: May 2019 chest x-ray showed some basilar scarring versus atelectasis January 2024 portable chest x-ray personally reviewed showing cardiomegaly, low lung volumes, interstitial prominence versus atelectasis bases, likely left pleural effusion January 2023 CT chest images independently reviewed showing normal pulmonary parenchyma, some atelectasis in the lingula, multiple calcified left upper lobe granulomas   PFT: August 2019 ratio normal, FVC 1.09 L 41% predicted, total lung capacity 3.59 L 78% predicted, DLCO 14.1 mL 69% predicted   Overnight oxygen testing: January 31, 2018 on room air: O2 saturation less than 88% for 25 minutes, O2 saturation nadir 82%   Labs: August 2019 sed rate 24, aldolase normal 4.9, ANA negative, anti-Jo 1-, centromere negative, CCP negative, rheumatoid factor negative, SSA negative, SSB negative, SCL 70 neg   Path:   Echo: September 2016 echocardiogram LVEF 55 to 60%, left atrium severely dilated, trivial mitral regurgitation, PA systolic pressure 25 mmHg September 2019 echocardiogram normal LVEF but RVSP was 46 mmHg January 2024 TTE: LVEF 65 to 70%, mild LVH, RV systolic function is normal, size is normal, left atrium severely dilated valves okay   Heart Catheterization: February 03, 2018 left heart catheterization: Proximal LAD 40%, mid LAD 30%, first obtuse marginal 30% stenosed, LVEDP moderately elevated (27 mmHg) to   Sleep study: 2019 AHI 40.6, greater than 170 minutes below 88%  Ambulatory oximetry: February 2024 walked 200 feet in clinic on room air and O2 saturation dropped to 84%     Assessment & Plan:   Mild intermittent asthma without complication - Plan: Pulmonary function test  OSA (obstructive sleep apnea) - Plan: Ambulatory Referral for DME  Chronic diastolic congestive heart failure ([place])  Pulmonary hypertension, low resistance ([facility])  Chronic respiratory failure with hypoxia ([place])  Discussion: Amber Stephenson  has severe obstructive sleep apnea which has been untreated for several years.  She says the reason is claustrophobia.  Unfortunately this has led to long periods of hypoxemia and I believe worsening pulmonary hypertension.  Since I saw her 4 years ago she has now developed exertional hypoxemia.  Heart failure complicates this.  It is not clear to me that there is any lung disease which is causing this as her pulmonary parenchyma looked normal on the most recent CT scan of the chest and baseline lung workup in the past never revealed convincing evidence of an underlying lung problem.  Plan: Chronic respiratory failure with hypoxemia: For now continue using oxygen when asleep Will set up CPAP, see below Use 2 L of oxygen when you exert yourself  Heart failure with preserved ejection fraction: Keep follow-up with cardiology Keep taking furosemide as you are doing Keep sodium  intake less than 2 g a day Keep fluid intake less than 2 L a day  Pulmonary hypertension: Repeat pulmonary function test to make sure there is no evidence of a new lung problem You need to use oxygen when you exert yourself to help with this He need to have your obstructive sleep apnea treated right away as this is the predominant cause for your pulmonary hypertension  Obstructive sleep apnea, severe: Start using CPAP at night, we will prescribe ResMed 11 auto titrating machine 5 to 20 cm of water Request a nose mask when the machine is brought to you If you have problems with the mask we can always refer you to a mask fitting class No driving while sleepy  We will see you back in 4 to 6 weeks or sooner if needed.  Immunizations: Immunization History  Administered Date(s) Administered   Fluad Quad(high Dose 65+) 03/04/2019, 04/03/2021, 03/26/2022   Influenza, High Dose Seasonal PF 03/30/2015, 03/17/2016, 03/19/2017, 02/17/2018   Influenza,inj,Quad PF,6+ Mos 03/30/2015, 03/17/2016, 02/29/2020   Moderna Sars-Covid-2  Vaccination 07/18/2019, 08/15/2019, 05/02/2020   Pneumococcal Conjugate-13 11/12/2017   Pneumococcal Polysaccharide-23 05/30/2015   Tdap 07/17/2011   Zoster Recombinat (Shingrix) 01/30/2021, 04/09/2021   Zoster, Live 07/17/2011, 06/02/2012     Current Outpatient Medications:    Acetaminophen (TYLENOL PO), Take 2 tablets by mouth as needed (pain/headache)., Disp: , Rfl:    albuterol (PROVENTIL) (2.5 MG/3ML) 0.083% nebulizer solution, Take 3 mLs (2.5 mg total) by nebulization every 6 (six) hours as needed for wheezing or shortness of breath. (Patient taking differently: Take 2.5 mg by nebulization as needed for wheezing or shortness of breath.), Disp: 150 mL, Rfl: 0   albuterol (VENTOLIN HFA) 108 (90 Base) MCG/ACT inhaler, Inhale 2 puffs into the lungs every 6 (six) hours as needed for wheezing or shortness of breath. (Patient taking differently: Inhale 2 puffs into the lungs as needed for wheezing or shortness of breath.), Disp: 1 each, Rfl: 0   apixaban (ELIQUIS) 5 MG TABS tablet, Take 1 tablet (5 mg total) by mouth 2 (two) times daily., Disp: 60 tablet, Rfl: 0   ARIPiprazole (ABILIFY) 2 MG tablet, Take 2 mg by mouth at bedtime., Disp: , Rfl:    atorvastatin (LIPITOR) 40 MG tablet, TAKE 1 TABLET(40 MG) BY MOUTH DAILY (Patient taking differently: Take 40 mg by mouth daily.), Disp: 90 tablet, Rfl: 0   benztropine (COGENTIN) 1 MG tablet, Take 1 tablet (1 mg total) by mouth 2 (two) times daily., Disp: 60 tablet, Rfl: 2   Continuous Blood Gluc Sensor (DEXCOM G7 SENSOR) MISC, Change sensor every 10 days, Disp: 9 each, Rfl: 3   Continuous Blood Gluc Transmit (DEXCOM G6 TRANSMITTER) MISC, Use as instructed to check blood sugar. Change every 90 days, Disp: 1 each, Rfl: 1   denosumab (PROLIA) 60 MG/ML SOSY injection, Inject 60 mg into the skin every 6 (six) months., Disp: 1 mL, Rfl: 1   escitalopram (LEXAPRO) 10 MG tablet, Take 10 mg by mouth daily., Disp: , Rfl:    fluconazole (DIFLUCAN) 150 MG tablet,  Take 1 now and can repeat 1 in 3 days, Disp: 2 tablet, Rfl: 0   Fluticasone-Salmeterol (ADVAIR DISKUS) 500-50 MCG/DOSE AEPB, Inhale 1 puff into the lungs in the morning and at bedtime. (Patient taking differently: Inhale 1 puff into the lungs as needed (SOB).), Disp: 60 each, Rfl: 0   furosemide (LASIX) 40 MG tablet, Take 0.5 tablets (20 mg total) by mouth 2 (two) times daily. Take extra '20mg'$   in am if dyspnea, weight gain of 3 lbs or greater, or edema., Disp: 30 tablet, Rfl: 0   Galcanezumab-gnlm (EMGALITY) 120 MG/ML SOAJ, One injection per month (Patient taking differently: Inject 120 mg into the skin every 30 (thirty) days.), Disp: 1 mL, Rfl: 0   hydrOXYzine (VISTARIL) 25 MG capsule, Take 1 capsule (25 mg total) by mouth every 8 (eight) hours as needed., Disp: 90 capsule, Rfl: 1   icosapent Ethyl (VASCEPA) 1 g capsule, TAKE 2 CAPSULES TWICE A DAY WITH MEALS (Patient taking differently: Take 2 g by mouth 2 (two) times daily.), Disp: 360 capsule, Rfl: 0   insulin glargine (LANTUS SOLOSTAR) 100 UNIT/ML Solostar Pen, Inject 68 Units into the skin daily. (Patient taking differently: Inject 34 Units into the skin 2 (two) times daily.), Disp: 75 mL, Rfl: 3   insulin lispro (HUMALOG KWIKPEN) 100 UNIT/ML KwikPen, Max daily 50 units (Patient taking differently: Inject 4-8 Units into the skin 3 (three) times daily.), Disp: 45 mL, Rfl: 6   Insulin Pen Needle 29G X 12MM MISC, 1 Device by Does not apply route daily in the afternoon., Disp: 400 each, Rfl: 3   lisinopril (ZESTRIL) 10 MG tablet, Take 1 tablet (10 mg total) by mouth daily., Disp: 90 tablet, Rfl: 3   LORazepam (ATIVAN) 1 MG tablet, Take 1 tablet (1 mg total) by mouth 2 (two) times daily. And give 2 tablets by mouth at bedtime (Patient taking differently: Take 1-2 mg by mouth 2 (two) times daily. Take 2 mg by mouth in the morning and 1 mg by mouth at bedtime), Disp: 90 tablet, Rfl: 0   metFORMIN (GLUCOPHAGE) 500 MG tablet, Take 1 tablet (500 mg total) by  mouth 2 (two) times daily with a meal. TAKE 1 TABLET(500 MG) BY MOUTH TWICE DAILY AFTER A MEAL (Patient taking differently: Take 500 mg by mouth 2 (two) times daily with a meal.), Disp: 180 tablet, Rfl: 3   metoprolol succinate (TOPROL XL) 25 MG 24 hr tablet, Take 1 tablet (25 mg total) by mouth in the morning and at bedtime. (Patient taking differently: Take 25 mg by mouth in the morning and at bedtime. Take with 100 mg tablet twice daily), Disp: 180 tablet, Rfl: 3   metoprolol succinate (TOPROL-XL) 100 MG 24 hr tablet, TAKE 1 TABLET IN THE MORNING AND AT BEDTIME (Patient taking differently: Take 100 mg by mouth in the morning and at bedtime. Take with 25 mg tablet twice daily.), Disp: 180 tablet, Rfl: 3   morphine (MS CONTIN) 15 MG 12 hr tablet, Take 1 tablet (15 mg total) by mouth every 12 (twelve) hours. This is a long acting form, so take 1 tab 2x/day to keep pain more even- it is NOT a short acting pain med like you were taking- for chronic pain, Disp: 60 tablet, Rfl: 0   naloxone (NARCAN) nasal spray 4 mg/0.1 mL, Place 1 spray into the nose once., Disp: , Rfl:    nystatin (MYCOSTATIN/NYSTOP) powder, Apply 1 Application topically as needed (rash)., Disp: 15 g, Rfl: 2   nystatin ointment (MYCOSTATIN), Apply 1 Application topically 2 (two) times daily. (Patient taking differently: Apply 1 Application topically as needed (rash).), Disp: 30 g, Rfl: 3   ondansetron (ZOFRAN-ODT) 8 MG disintegrating tablet, Take 1 tablet (8 mg total) by mouth every 6 (six) hours as needed for nausea or vomiting., Disp: 20 tablet, Rfl: 1   OXcarbazepine (TRILEPTAL) 150 MG tablet, Take 1 tablet (150 mg total) by mouth 2 (two) times daily.,  Disp: 60 tablet, Rfl: 0   potassium chloride SA (KLOR-CON M) 20 MEQ tablet, Take 1 tablet (20 mEq total) by mouth daily. (Patient taking differently: Take 10 mEq by mouth at bedtime.), Disp: 30 tablet, Rfl: 5   pregabalin (LYRICA) 200 MG capsule, TAKE 1 CAPSULE(200 MG) BY MOUTH TWICE DAILY  (Patient taking differently: Take 200 mg by mouth 2 (two) times daily.), Disp: 60 capsule, Rfl: 5   senna-docusate (SENOKOT-S) 8.6-50 MG tablet, Take 1 tablet by mouth at bedtime., Disp: 30 tablet, Rfl: 0   tirzepatide (MOUNJARO) 5 MG/0.5ML Pen, Inject 5 mg into the skin once a week., Disp: , Rfl:    Vitamin D, Cholecalciferol, 25 MCG (1000 UT) TABS, Take 1,000 Units by mouth daily in the afternoon., Disp: , Rfl:  No current facility-administered medications for this visit.  Facility-Administered Medications Ordered in Other Visits:    bupivacaine-epinephrine (MARCAINE W/ EPI) 0.5% -1:200000 injection, , , Anesthesia Intra-op, Effie Berkshire, MD, 12 mL at 01/21/19 [phone]

## 2022-07-11 ENCOUNTER — Emergency Department (HOSPITAL_COMMUNITY)
Admission: EM | Admit: 2022-07-11 | Discharge: 2022-07-11 | Disposition: A | Discharge status: Home / Self Care | Payer: Medicare Other | Attending: Emergency Medicine | Admitting: Emergency Medicine

## 2022-07-11 ENCOUNTER — Other Ambulatory Visit: Payer: Self-pay

## 2022-07-11 ENCOUNTER — Emergency Department (HOSPITAL_COMMUNITY): Payer: Medicare Other

## 2022-07-11 DIAGNOSIS — Z91040 Latex allergy status: Secondary | ICD-10-CM | POA: Diagnosis not present

## 2022-07-11 DIAGNOSIS — E119 Type 2 diabetes mellitus without complications: Secondary | ICD-10-CM | POA: Diagnosis not present

## 2022-07-11 DIAGNOSIS — M25571 Pain in right ankle and joints of right foot: Secondary | ICD-10-CM | POA: Diagnosis not present

## 2022-07-11 DIAGNOSIS — M10071 Idiopathic gout, right ankle and foot: Secondary | ICD-10-CM | POA: Insufficient documentation

## 2022-07-11 DIAGNOSIS — Z79899 Other long term (current) drug therapy: Secondary | ICD-10-CM | POA: Diagnosis not present

## 2022-07-11 DIAGNOSIS — Z7984 Long term (current) use of oral hypoglycemic drugs: Secondary | ICD-10-CM | POA: Diagnosis not present

## 2022-07-11 DIAGNOSIS — I509 Heart failure, unspecified: Secondary | ICD-10-CM | POA: Diagnosis not present

## 2022-07-11 DIAGNOSIS — M79671 Pain in right foot: Secondary | ICD-10-CM | POA: Diagnosis present

## 2022-07-11 DIAGNOSIS — Z794 Long term (current) use of insulin: Secondary | ICD-10-CM | POA: Diagnosis not present

## 2022-07-11 DIAGNOSIS — R1012 Left upper quadrant pain: Secondary | ICD-10-CM | POA: Insufficient documentation

## 2022-07-11 DIAGNOSIS — I110 Hypertensive heart disease with heart failure: Secondary | ICD-10-CM | POA: Insufficient documentation

## 2022-07-11 DIAGNOSIS — I4891 Unspecified atrial fibrillation: Secondary | ICD-10-CM | POA: Insufficient documentation

## 2022-07-11 DIAGNOSIS — Z7901 Long term (current) use of anticoagulants: Secondary | ICD-10-CM | POA: Insufficient documentation

## 2022-07-11 DIAGNOSIS — M109 Gout, unspecified: Secondary | ICD-10-CM

## 2022-07-11 DIAGNOSIS — R110 Nausea: Secondary | ICD-10-CM | POA: Diagnosis not present

## 2022-07-11 LAB — URIC ACID: Uric Acid, Serum: 13.2 mg/dL — ABNORMAL HIGH (ref 2.5–7.1)

## 2022-07-11 LAB — CBC WITH DIFFERENTIAL/PLATELET
Abs Immature Granulocytes: 0.04 10*3/uL (ref 0.00–0.07)
Basophils Absolute: 0 10*3/uL (ref 0.0–0.1)
Basophils Relative: 0 %
Eosinophils Absolute: 0.2 10*3/uL (ref 0.0–0.5)
Eosinophils Relative: 1 %
HCT: 42.5 % (ref 36.0–46.0)
Hemoglobin: 13.8 g/dL (ref 12.0–15.0)
Immature Granulocytes: 0 %
Lymphocytes Relative: 18 %
Lymphs Abs: 2.1 10*3/uL (ref 0.7–4.0)
MCH: 29.6 pg (ref 26.0–34.0)
MCHC: 32.5 g/dL (ref 30.0–36.0)
MCV: 91.2 fL (ref 80.0–100.0)
Monocytes Absolute: 0.7 10*3/uL (ref 0.1–1.0)
Monocytes Relative: 6 %
Neutro Abs: 8.4 10*3/uL — ABNORMAL HIGH (ref 1.7–7.7)
Neutrophils Relative %: 75 %
Platelets: 170 10*3/uL (ref 150–400)
RBC: 4.66 MIL/uL (ref 3.87–5.11)
RDW: 13.1 % (ref 11.5–15.5)
WBC: 11.4 10*3/uL — ABNORMAL HIGH (ref 4.0–10.5)
nRBC: 0 % (ref 0.0–0.2)

## 2022-07-11 LAB — BLOOD GAS, ARTERIAL
Acid-Base Excess: 4.4 mmol/L — ABNORMAL HIGH (ref 0.0–2.0)
Bicarbonate: 29.2 mmol/L — ABNORMAL HIGH (ref 20.0–28.0)
Drawn by: 23430
FIO2: 21 %
O2 Saturation: 94.8 %
Patient temperature: 37.1
pCO2 arterial: 43 mmHg (ref 32–48)
pH, Arterial: 7.44 (ref 7.35–7.45)
pO2, Arterial: 64 mmHg — ABNORMAL LOW (ref 83–108)

## 2022-07-11 LAB — COMPREHENSIVE METABOLIC PANEL
ALT: 13 U/L (ref 0–44)
AST: 17 U/L (ref 15–41)
Albumin: 3.5 g/dL (ref 3.5–5.0)
Alkaline Phosphatase: 74 U/L (ref 38–126)
Anion gap: 8 (ref 5–15)
BUN: 18 mg/dL (ref 8–23)
CO2: 30 mmol/L (ref 22–32)
Calcium: 9 mg/dL (ref 8.9–10.3)
Chloride: 100 mmol/L (ref 98–111)
Creatinine, Ser: 0.92 mg/dL (ref 0.44–1.00)
GFR, Estimated: >60 mL/min (ref >60)
Glucose, Bld: 163 mg/dL — ABNORMAL HIGH (ref 70–99)
Potassium: 3.7 mmol/L (ref 3.5–5.1)
Sodium: 138 mmol/L (ref 135–145)
Total Bilirubin: 0.7 mg/dL (ref 0.3–1.2)
Total Protein: 7.3 g/dL (ref 6.5–8.1)

## 2022-07-11 LAB — LIPASE, BLOOD: Lipase: 34 U/L (ref 11–51)

## 2022-07-11 MED ORDER — COLCHICINE 0.6 MG PO TABS
0.6000 mg | ORAL_TABLET | Freq: Once | ORAL | Status: AC
  Administered 2022-07-11: 0.6 mg via ORAL
  Filled 2022-07-11: qty 1

## 2022-07-11 MED ORDER — COLCHICINE 0.6 MG PO TABS
1.2000 mg | ORAL_TABLET | Freq: Once | ORAL | Status: AC
  Administered 2022-07-11: 1.2 mg via ORAL
  Filled 2022-07-11: qty 2

## 2022-07-11 MED ORDER — PREDNISONE 20 MG PO TABS
40.0000 mg | ORAL_TABLET | Freq: Once | ORAL | Status: AC
  Administered 2022-07-11: 40 mg via ORAL
  Filled 2022-07-11: qty 2

## 2022-07-11 MED ORDER — PREDNISONE 20 MG PO TABS: ORAL_TABLET | ORAL | Status: DC

## 2022-07-11 NOTE — ED Provider Notes (Signed)
[facility] Provider Note   CSN: [id number] Arrival date & time: 07/11/22  1004     History {Add pertinent medical, surgical, social history, OB history to HPI:1} Chief Complaint  Patient presents with   Ankle Pain    right    Amber Stephenson is a 77 y.o. female with a history significant for CHF,Atrial fibrillation on Eliquis, chronic back pain requiring MS Contin, type 2 diabetes, hypertension, sleep apnea, history of pulmonary hypertension, new to CPAP per pulmonary consult just several days ago, presenting with multiple complaints.  She had localized pain to her right foot when she was discharged from the hospital on January 28, over the past 2 weeks his pain has worsened, when she woke today she was unable to weight-bear secondary to severe pain.  She denies injuries or falls, does not have a history of gout, denies any other trauma to the foot.  She also endorses left upper quadrant abdominal pain of unclear etiology.  She does endorse nausea without emesis, she has had no changes in her bowel habits, although she fluctuates between diarrhea and constipation with her IBS, but this is unchanged today.  She denies dysuria, fevers or chills.  Of note patient is drowsy, she drifts off to sleep when not stimulated.  Husband at the bedside states this is not an abnormal finding for her.   The history is provided by the patient, the spouse and medical records.       Home Medications Prior to Admission medications   Medication Sig Start Date End Date Taking? Authorizing Provider  Acetaminophen (TYLENOL PO) Take 2 tablets by mouth as needed (pain/headache).    [provider]  albuterol (PROVENTIL) (2.5 MG/3ML) 0.083% nebulizer solution Take 3 mLs (2.5 mg total) by nebulization every 6 (six) hours as needed for wheezing or shortness of breath. Patient taking differently: Take 2.5 mg by nebulization as needed for wheezing or  shortness of breath. 08/09/20   Gerlene Fee, NP  albuterol (VENTOLIN HFA) 108 (90 Base) MCG/ACT inhaler Inhale 2 puffs into the lungs every 6 (six) hours as needed for wheezing or shortness of breath. Patient taking differently: Inhale 2 puffs into the lungs as needed for wheezing or shortness of breath. 08/09/20   Gerlene Fee, NP  apixaban (ELIQUIS) 5 MG TABS tablet Take 1 tablet (5 mg total) by mouth 2 (two) times daily. 07/01/22 07/31/22  Manuella Ghazi, Pratik D, DO  ARIPiprazole (ABILIFY) 2 MG tablet Take 2 mg by mouth at bedtime. 10/07/21   [provider]  atorvastatin (LIPITOR) 40 MG tablet TAKE 1 TABLET(40 MG) BY MOUTH DAILY Patient taking differently: Take 40 mg by mouth daily. 04/30/22   [name], MD  benztropine (COGENTIN) 1 MG tablet Take 1 tablet (1 mg total) by mouth 2 (two) times daily. 12/11/21   [name], MD  Continuous Blood Gluc Sensor (DEXCOM G7 SENSOR) MISC Change sensor every 10 days 05/20/22   Shamleffer, Melanie Crazier, MD  Continuous Blood Gluc Transmit (DEXCOM G6 TRANSMITTER) MISC Use as instructed to check blood sugar. Change every 90 days 05/12/22   Shamleffer, Melanie Crazier, MD  denosumab (PROLIA) 60 MG/ML SOSY injection Inject 60 mg into the skin every 6 (six) months. 08/14/20   [name], MD  escitalopram (LEXAPRO) 10 MG tablet Take 10 mg by mouth daily. 10/13/21   [provider]  fluconazole (DIFLUCAN) 150 MG tablet Take 1 now and can repeat 1 in 3 days 06/19/22  Derrek Monaco A, NP  Fluticasone-Salmeterol (ADVAIR DISKUS) 500-50 MCG/DOSE AEPB Inhale 1 puff into the lungs in the morning and at bedtime. Patient taking differently: Inhale 1 puff into the lungs as needed (SOB). 08/09/20   Gerlene Fee, NP  furosemide (LASIX) 40 MG tablet Take 0.5 tablets (20 mg total) by mouth 2 (two) times daily. Take extra 88m in am if dyspnea, weight gain of 3 lbs or greater, or edema. 06/29/22 07/29/22  SManuella Ghazi Pratik D, DO  Galcanezumab-gnlm  (EMGALITY) 120 MG/ML SOAJ One injection per month Patient taking differently: Inject 120 mg into the skin every 30 (thirty) days. 05/19/22   WRondel Jumbo PA-C  hydrOXYzine (VISTARIL) 25 MG capsule Take 1 capsule (25 mg total) by mouth every 8 (eight) hours as needed. 03/31/22   Hawks, CTheador Hawthorne FNP  icosapent Ethyl (VASCEPA) 1 g capsule TAKE 2 CAPSULES TWICE A DAY WITH MEALS Patient taking differently: Take 2 g by mouth 2 (two) times daily. 05/28/22   SClaretta Fraise MD  insulin glargine (LANTUS SOLOSTAR) 100 UNIT/ML Solostar Pen Inject 68 Units into the skin daily. Patient taking differently: Inject 34 Units into the skin 2 (two) times daily. 06/09/22   Shamleffer, IMelanie Crazier MD  insulin lispro (HUMALOG KWIKPEN) 100 UNIT/ML KwikPen Max daily 50 units Patient taking differently: Inject 4-8 Units into the skin 3 (three) times daily. 05/28/22   Shamleffer, IMelanie Crazier MD  Insulin Pen Needle 29G X 12MM MISC 1 Device by Does not apply route daily in the afternoon. 05/28/22   Shamleffer, IMelanie Crazier MD  lisinopril (ZESTRIL) 10 MG tablet Take 1 tablet (10 mg total) by mouth daily. 01/14/22   SClaretta Fraise MD  LORazepam (ATIVAN) 1 MG tablet Take 1 tablet (1 mg total) by mouth 2 (two) times daily. And give 2 tablets by mouth at bedtime Patient taking differently: Take 1-2 mg by mouth 2 (two) times daily. Take 2 mg by mouth in the morning and 1 mg by mouth at bedtime 08/09/20   GGerlene Fee NP  metFORMIN (GLUCOPHAGE) 500 MG tablet Take 1 tablet (500 mg total) by mouth 2 (two) times daily with a meal. TAKE 1 TABLET(500 MG) BY MOUTH TWICE DAILY AFTER A MEAL Patient taking differently: Take 500 mg by mouth 2 (two) times daily with a meal. 05/28/22   Shamleffer, IMelanie Crazier MD  metoprolol succinate (TOPROL XL) 25 MG 24 hr tablet Take 1 tablet (25 mg total) by mouth in the morning and at bedtime. Patient taking differently: Take 25 mg by mouth in the morning and at bedtime. Take with  100 mg tablet twice daily 11/13/21   TSueanne Margarita MD  metoprolol succinate (TOPROL-XL) 100 MG 24 hr tablet TAKE 1 TABLET IN THE MORNING AND AT BEDTIME Patient taking differently: Take 100 mg by mouth in the morning and at bedtime. Take with 25 mg tablet twice daily. 11/13/21   TSueanne Margarita MD  morphine (MS CONTIN) 15 MG 12 hr tablet Take 1 tablet (15 mg total) by mouth every 12 (twelve) hours. This is a long acting form, so take 1 tab 2x/day to keep pain more even- it is NOT a short acting pain med like you were taking- for chronic pain 06/20/22   Lovorn, MJinny Blossom MD  naloxone ([facility] nasal spray 4 mg/0.1 mL Place 1 spray into the nose once. 08/08/21   [provider]  nystatin (MYCOSTATIN/NYSTOP) powder Apply 1 Application topically as needed (rash). 06/16/22   Shamleffer, IMelanie Crazier MD  nystatin ointment (MYCOSTATIN) Apply 1 Application topically 2 (two) times daily. Patient taking differently: Apply 1 Application topically as needed (rash). 12/27/21   Estill Dooms, NP  ondansetron (ZOFRAN-ODT) 8 MG disintegrating tablet Take 1 tablet (8 mg total) by mouth every 6 (six) hours as needed for nausea or vomiting. 01/13/22   [name], MD  OXcarbazepine (TRILEPTAL) 150 MG tablet Take 1 tablet (150 mg total) by mouth 2 (two) times daily. 06/13/22   Raiford Noble Latif, DO  potassium chloride SA (KLOR-CON M) 20 MEQ tablet Take 1 tablet (20 mEq total) by mouth daily. Patient taking differently: Take 10 mEq by mouth at bedtime. 01/27/22   [name], MD  pregabalin (LYRICA) 200 MG capsule TAKE 1 CAPSULE(200 MG) BY MOUTH TWICE DAILY Patient taking differently: Take 200 mg by mouth 2 (two) times daily. 03/27/22   Lovorn, Jinny Blossom, MD  senna-docusate (SENOKOT-S) 8.6-50 MG tablet Take 1 tablet by mouth at bedtime. 06/13/22   Sheikh, Omair Latif, DO  tirzepatide [facility] W) 5 MG/0.5ML Pen Inject 5 mg into the skin once a week.    [provider]  Vitamin D, Cholecalciferol, 25 MCG  (1000 UT) TABS Take 1,000 Units by mouth daily in the afternoon. 07/26/20   [provider]      Allergies    Iodine, Ivp dye [iodinated contrast media], Latex, and Tizanidine    Review of Systems   Review of Systems  Physical Exam Updated Vital Signs BP 133/76   Pulse 85   Temp 98.5 F (36.9 C) (Oral)   Resp 16   Ht 5' 2"$  (1.575 m)   Wt 88.9 kg   SpO2 98%   BMI 35.85 kg/m  Physical Exam  ED Results / Procedures / Treatments   Labs (all labs ordered are listed, but only abnormal results are displayed) Labs Reviewed - No data to display  EKG None  Radiology No results found.  Procedures Procedures  {Document cardiac monitor, telemetry assessment procedure when appropriate:1}  Medications Ordered in ED Medications - No data to display  ED Course/ Medical Decision Making/ A&P   {   Click here for ABCD2, HEART and other calculatorsREFRESH Note before signing :1}                          Medical Decision Making Amount and/or Complexity of Data Reviewed Radiology: ordered.   ***  {Document critical care time when appropriate:1} {Document review of labs and clinical decision tools ie heart score, Chads2Vasc2 etc:1}  {Document your independent review of radiology images, and any outside records:1} {Document your discussion with family members, caretakers, and with consultants:1} {Document social determinants of health affecting pt's care:1} {Document your decision making why or why not admission, treatments were needed:1} Final Clinical Impression(s) / ED Diagnoses Final diagnoses:  None    Rx / DC Orders ED Discharge Orders     None

## 2022-07-11 NOTE — ED Notes (Signed)
Patient transported to X-ray 

## 2022-07-11 NOTE — Discharge Instructions (Signed)
Take your next dose of prednisone tomorrow, elevation in warm compresses can help also relieve this gouty flare.  Plan follow-up with your primary doctor for recheck of your symptoms or not improving over the next week.  Keep a close watch on your blood glucose levels as prednisone can elevate your glucose.  As long as these numbers are under fair control, complete the course, if they spike greater than 250 I would stop taking this medication.

## 2022-07-11 NOTE — ED Triage Notes (Signed)
Pt bib ems c/o right ankle pain x 2 weeks, worse when woke up this AM unable to bear weight, no trauma no fall. 5/10 pain. On chronic 2LNC PRN. Pt now denies ankle pain, now reports chronic back pain. Pt falling asleep during triage, states she takes Morphine daily, had dose today.   138/86 98% on 2LNC CBG 191

## 2022-07-14 ENCOUNTER — Telehealth: Payer: Self-pay | Admitting: *Deleted

## 2022-07-14 ENCOUNTER — Other Ambulatory Visit: Payer: Self-pay | Admitting: Family Medicine

## 2022-07-14 DIAGNOSIS — E1169 Type 2 diabetes mellitus with other specified complication: Secondary | ICD-10-CM

## 2022-07-14 NOTE — Progress Notes (Signed)
  Care Coordination  Note  07/14/2022 Name: NYAJA DUBUQUE MRN: [id number] DOB: 02-27-1946  ADELAINE ROPPOLO is a 77 y.o. year old primary care patient of [name], MD.   Follow up plan: Hospital Follow Up appointment scheduled with Chevis Pretty 07/18/22 at 2:20 PM aware per [place]  Direct Dial: [phone]

## 2022-07-14 NOTE — Transitions of Care (Post Inpatient/ED Visit) (Signed)
   07/14/2022  Name: Amber Stephenson MRN: [id number] DOB: 1946-05-27  Today's TOC FU Call Status: Today's TOC FU Call Status:: Successful TOC FU Call Competed TOC FU Call Complete Date: 07/14/22  Transition Care Management Follow-up Telephone Call Date of Discharge: 07/11/22 Discharge Facility: [facility] Type of Discharge: Emergency Department Reason for ED Visit: Other: (Gout) How have you been since you were released from the hospital?: Better Any questions or concerns?: No  Items Reviewed: Did you receive and understand the discharge instructions provided?: Yes Medications obtained and verified?: Yes (Medications Reviewed) Any new allergies since your discharge?: No Dietary orders reviewed?: No Do you have support at home?: Yes People in Home: spouse Name of Support/Comfort Primary Source: [facility] and Equipment/Supplies: [place] Ordered?: No Any new equipment or medical supplies ordered?: No  Functional Questionnaire: Do you need assistance with bathing/showering or dressing?: No Do you need assistance with eating?: No Do you have difficulty maintaining continence: No Do you need assistance with getting out of bed/getting out of a chair/moving?: No Do you have difficulty managing or taking your medications?: No  Folllow up appointments reviewed: PCP Follow-up appointment confirmed?: Yes Date of PCP follow-up appointment?: 07/18/22 Follow-up Provider: Chevis Pretty at [facility] Follow-up appointment confirmed?: No Reason Specialist Follow-Up Not Confirmed:  (no appt needed) Do you need transportation to your follow-up appointment?: No Do you understand care options if your condition(s) worsen?: Yes-patient verbalized understanding  SDOH Interventions Today    Flowsheet Row Most Recent Value  SDOH Interventions   Food Insecurity Interventions Intervention Not Indicated  Housing Interventions Intervention Not Indicated   Transportation Interventions Intervention Not Indicated      Care Coordination with Chong Sicilian on [id number] 3:00 SIGNATURE.[facility] [phone]

## 2022-07-15 DIAGNOSIS — I2510 Atherosclerotic heart disease of native coronary artery without angina pectoris: Secondary | ICD-10-CM | POA: Diagnosis not present

## 2022-07-15 DIAGNOSIS — I110 Hypertensive heart disease with heart failure: Secondary | ICD-10-CM | POA: Diagnosis not present

## 2022-07-15 DIAGNOSIS — I482 Chronic atrial fibrillation, unspecified: Secondary | ICD-10-CM | POA: Diagnosis not present

## 2022-07-15 DIAGNOSIS — J9621 Acute and chronic respiratory failure with hypoxia: Secondary | ICD-10-CM | POA: Diagnosis not present

## 2022-07-15 DIAGNOSIS — I5033 Acute on chronic diastolic (congestive) heart failure: Secondary | ICD-10-CM | POA: Diagnosis not present

## 2022-07-15 DIAGNOSIS — I272 Pulmonary hypertension, unspecified: Secondary | ICD-10-CM | POA: Diagnosis not present

## 2022-07-17 ENCOUNTER — Ambulatory Visit (INDEPENDENT_AMBULATORY_CARE_PROVIDER_SITE_OTHER): Payer: Medicare Other | Admitting: Family Medicine

## 2022-07-17 ENCOUNTER — Encounter: Payer: Self-pay | Admitting: Family Medicine

## 2022-07-17 VITALS — BP 95/71 | HR 76 | Ht 62.0 in | Wt 193.0 lb

## 2022-07-17 DIAGNOSIS — M109 Gout, unspecified: Secondary | ICD-10-CM | POA: Diagnosis not present

## 2022-07-17 DIAGNOSIS — M5417 Radiculopathy, lumbosacral region: Secondary | ICD-10-CM | POA: Diagnosis not present

## 2022-07-17 MED ORDER — COLCHICINE 0.6 MG PO TABS: 0.6000 mg | ORAL_TABLET | Freq: Two times a day (BID) | ORAL | 1 refills | Status: DC | PRN

## 2022-07-17 NOTE — Progress Notes (Signed)
BP 95/71   Pulse 76   Ht 5' 2"$  (1.575 m)   Wt 193 lb (87.5 kg)   SpO2 96%   BMI 35.30 kg/m    Subjective:   Patient ID: Amber Stephenson, female    DOB: 12/10/1945, 77 y.o.   MRN: [id number]  HPI: Amber Stephenson is a 77 y.o. female presenting on 07/17/2022 for Hospitalization Follow-up (gout)   HPI Transition of care/ER follow-up Patient is coming in for transitional care/ER follow-up visit today.  Patient was contacted on 07/14/2022 by Brooke Bonito from our office for transitional care phone visit.  Patient was in the emergency department on 07/11/2022 with gout.  Patient says her right ankle hurts a lot worse than her left but they both started hurting about a month ago but really got worse and is significantly more painful over the past 1 week.  She went into the emergency department for evaluation and was found to have a uric acid of 13.2.  She was given some medicine in the ER which helped a little bit and then went home with prednisone but it kicked her sugars up too high so she had to stop it.  Relevant past medical, surgical, family and social history reviewed and updated as indicated. Interim medical history since our last visit reviewed. Allergies and medications reviewed and updated.  Review of Systems  Constitutional:  Negative for chills and fever.  Eyes:  Negative for visual disturbance.  Respiratory:  Negative for chest tightness and shortness of breath.   Cardiovascular:  Negative for chest pain and leg swelling.  Musculoskeletal:  Positive for arthralgias, joint swelling and myalgias. Negative for back pain and gait problem.  Skin:  Negative for rash.  Neurological:  Negative for dizziness, light-headedness and headaches.  Psychiatric/Behavioral:  Negative for agitation and behavioral problems.   All other systems reviewed and are negative.   Per HPI unless specifically indicated above   Allergies as of 07/17/2022       Reactions   Iodine Anaphylaxis    Ivp Dye [iodinated Contrast Media] Anaphylaxis, Swelling   Throat closes   Latex Other (See Comments)   Latex tape pulls skin with it   Tizanidine Other (See Comments)   Weakness, goofy, bad dreams        Medication List        Accurate as of July 17, 2022 12:40 PM. If you have any questions, ask your nurse or doctor.          STOP taking these medications    predniSONE 20 MG tablet Commonly known as: DELTASONE Stopped by: Fransisca Kaufmann Detron Carras, MD       TAKE these medications    albuterol (2.5 MG/3ML) 0.083% nebulizer solution Commonly known as: PROVENTIL Take 3 mLs (2.5 mg total) by nebulization every 6 (six) hours as needed for wheezing or shortness of breath. What changed: when to take this   albuterol 108 (90 Base) MCG/ACT inhaler Commonly known as: VENTOLIN HFA Inhale 2 puffs into the lungs every 6 (six) hours as needed for wheezing or shortness of breath. What changed: when to take this   apixaban 5 MG Tabs tablet Commonly known as: Eliquis Take 1 tablet (5 mg total) by mouth 2 (two) times daily.   ARIPiprazole 2 MG tablet Commonly known as: ABILIFY Take 2 mg by mouth at bedtime.   atorvastatin 40 MG tablet Commonly known as: LIPITOR TAKE 1 TABLET(40 MG) BY MOUTH DAILY (NEEDS TO BE SEEN BEFORE NEXT  REFILL)   benztropine 1 MG tablet Commonly known as: COGENTIN Take 1 tablet (1 mg total) by mouth 2 (two) times daily.   colchicine 0.6 MG tablet Take 1 tablet (0.6 mg total) by mouth 2 (two) times daily as needed. Started by: Worthy Rancher, MD   Dexcom G6 Transmitter Misc Use as instructed to check blood sugar. Change every 90 days   Dexcom G7 Sensor Misc Change sensor every 10 days   Emgality 120 MG/ML Soaj Generic drug: Galcanezumab-gnlm One injection per month What changed:  how much to take how to take this when to take this additional instructions   escitalopram 10 MG tablet Commonly known as: LEXAPRO Take 10 mg by mouth  daily.   fluconazole 150 MG tablet Commonly known as: DIFLUCAN Take 1 now and can repeat 1 in 3 days   Fluticasone-Salmeterol 500-50 MCG/DOSE Aepb Commonly known as: Advair Diskus Inhale 1 puff into the lungs in the morning and at bedtime. What changed:  when to take this reasons to take this   furosemide 40 MG tablet Commonly known as: LASIX Take 0.5 tablets (20 mg total) by mouth 2 (two) times daily. Take extra 1m in am if dyspnea, weight gain of 3 lbs or greater, or edema.   hydrOXYzine 25 MG capsule Commonly known as: VISTARIL Take 1 capsule (25 mg total) by mouth every 8 (eight) hours as needed.   icosapent Ethyl 1 g capsule Commonly known as: VASCEPA TAKE 2 CAPSULES TWICE A DAY WITH MEALS What changed: See the new instructions.   insulin lispro 100 UNIT/ML KwikPen Commonly known as: HumaLOG KwikPen Max daily 50 units What changed:  how much to take how to take this when to take this additional instructions   Insulin Pen Needle 29G X 12MM Misc 1 Device by Does not apply route daily in the afternoon.   Lantus SoloStar 100 UNIT/ML Solostar Pen Generic drug: insulin glargine Inject 68 Units into the skin daily. What changed:  how much to take when to take this   lisinopril 10 MG tablet Commonly known as: ZESTRIL Take 1 tablet (10 mg total) by mouth daily.   LORazepam 1 MG tablet Commonly known as: ATIVAN Take 1 tablet (1 mg total) by mouth 2 (two) times daily. And give 2 tablets by mouth at bedtime What changed:  how much to take additional instructions   metFORMIN 500 MG tablet Commonly known as: GLUCOPHAGE Take 1 tablet (500 mg total) by mouth 2 (two) times daily with a meal. TAKE 1 TABLET(500 MG) BY MOUTH TWICE DAILY AFTER A MEAL What changed: additional instructions   metoprolol succinate 25 MG 24 hr tablet Commonly known as: Toprol XL Take 1 tablet (25 mg total) by mouth in the morning and at bedtime. What changed: additional instructions    metoprolol succinate 100 MG 24 hr tablet Commonly known as: TOPROL-XL TAKE 1 TABLET IN THE MORNING AND AT BEDTIME What changed:  how much to take how to take this when to take this additional instructions   morphine 15 MG 12 hr tablet Commonly known as: MS Contin Take 1 tablet (15 mg total) by mouth every 12 (twelve) hours. This is a long acting form, so take 1 tab 2x/day to keep pain more even- it is NOT a short acting pain med like you were taking- for chronic pain   Mounjaro 5 MG/0.5ML Pen Generic drug: tirzepatide Inject 5 mg into the skin once a week.   naloxone 4 MG/0.1ML Liqd nasal spray  kit Commonly known as: NARCAN Place 1 spray into the nose once.   nystatin ointment Commonly known as: MYCOSTATIN Apply 1 Application topically 2 (two) times daily. What changed:  when to take this reasons to take this   nystatin powder Commonly known as: MYCOSTATIN/NYSTOP Apply 1 Application topically as needed (rash). What changed: Another medication with the same name was changed. Make sure you understand how and when to take each.   ondansetron 8 MG disintegrating tablet Commonly known as: ZOFRAN-ODT Take 1 tablet (8 mg total) by mouth every 6 (six) hours as needed for nausea or vomiting.   OXcarbazepine 150 MG tablet Commonly known as: TRILEPTAL Take 1 tablet (150 mg total) by mouth 2 (two) times daily.   potassium chloride SA 20 MEQ tablet Commonly known as: KLOR-CON M Take 1 tablet (20 mEq total) by mouth daily. What changed:  how much to take when to take this   pregabalin 200 MG capsule Commonly known as: LYRICA TAKE 1 CAPSULE(200 MG) BY MOUTH TWICE DAILY What changed: See the new instructions.   Prolia 60 MG/ML Sosy injection Generic drug: denosumab Inject 60 mg into the skin every 6 (six) months.   senna-docusate 8.6-50 MG tablet Commonly known as: Senokot-S Take 1 tablet by mouth at bedtime.   TYLENOL PO Take 2 tablets by mouth as needed  (pain/headache).   Vitamin D (Cholecalciferol) 25 MCG (1000 UT) Tabs Take 1,000 Units by mouth daily in the afternoon.         Objective:   BP 95/71   Pulse 76   Ht 5' 2"$  (1.575 m)   Wt 193 lb (87.5 kg)   SpO2 96%   BMI 35.30 kg/m   Wt Readings from Last 3 Encounters:  07/17/22 193 lb (87.5 kg)  07/11/22 196 lb (88.9 kg)  07/10/22 195 lb 9.6 oz (88.7 kg)    Physical Exam Vitals and nursing note reviewed.  Constitutional:      Appearance: Normal appearance. She is obese.  Musculoskeletal:     Right ankle: Swelling (Trace) present. No deformity. Tenderness (Throughout) present. Normal pulse.     Right Achilles Tendon: Normal.     Left ankle: No swelling or deformity. Tenderness (Throughout) present. Normal pulse.     Left Achilles Tendon: Normal.     Right foot: Tenderness (In the field of her toes on the right foot) present. No swelling or crepitus. Normal pulse.     Left foot: No swelling, deformity, tenderness, bony tenderness or crepitus. Normal pulse.  Neurological:     Mental Status: She is alert.     Results for orders placed or performed during the hospital encounter of 07/11/22  CBC with Differential  Result Value Ref Range   WBC 11.4 (H) 4.0 - 10.5 K/uL   RBC 4.66 3.87 - 5.11 MIL/uL   Hemoglobin 13.8 12.0 - 15.0 g/dL   HCT 42.5 36.0 - 46.0 %   MCV 91.2 80.0 - 100.0 fL   MCH 29.6 26.0 - 34.0 pg   MCHC 32.5 30.0 - 36.0 g/dL   RDW 13.1 11.5 - 15.5 %   Platelets 170 150 - 400 K/uL   nRBC 0.0 0.0 - 0.2 %   Neutrophils Relative % 75 %   Neutro Abs 8.4 (H) 1.7 - 7.7 K/uL   Lymphocytes Relative 18 %   Lymphs Abs 2.1 0.7 - 4.0 K/uL   Monocytes Relative 6 %   Monocytes Absolute 0.7 0.1 - 1.0 K/uL   Eosinophils Relative 1 %  Eosinophils Absolute 0.2 0.0 - 0.5 K/uL   Basophils Relative 0 %   Basophils Absolute 0.0 0.0 - 0.1 K/uL   Immature Granulocytes 0 %   Abs Immature Granulocytes 0.04 0.00 - 0.07 K/uL  Comprehensive metabolic panel  Result Value Ref  Range   Sodium 138 135 - 145 mmol/L   Potassium 3.7 3.5 - 5.1 mmol/L   Chloride 100 98 - 111 mmol/L   CO2 30 22 - 32 mmol/L   Glucose, Bld 163 (H) 70 - 99 mg/dL   BUN 18 8 - 23 mg/dL   Creatinine, Ser 0.92 0.44 - 1.00 mg/dL   Calcium 9.0 8.9 - 10.3 mg/dL   Total Protein 7.3 6.5 - 8.1 g/dL   Albumin 3.5 3.5 - 5.0 g/dL   AST 17 15 - 41 U/L   ALT 13 0 - 44 U/L   Alkaline Phosphatase 74 38 - 126 U/L   Total Bilirubin 0.7 0.3 - 1.2 mg/dL   GFR, Estimated >60 >60 mL/min   Anion gap 8 5 - 15  Lipase, blood  Result Value Ref Range   Lipase 34 11 - 51 U/L  Uric acid  Result Value Ref Range   Uric Acid, Serum 13.2 (H) 2.5 - 7.1 mg/dL  Blood gas, arterial (at [facility])  Result Value Ref Range   FIO2 21 %   pH, Arterial 7.44 7.35 - 7.45   pCO2 arterial 43 32 - 48 mmHg   pO2, Arterial 64 (L) 83 - 108 mmHg   Bicarbonate 29.2 (H) 20.0 - 28.0 mmol/L   Acid-Base Excess 4.4 (H) 0.0 - 2.0 mmol/L   O2 Saturation 94.8 %   Patient temperature 37.1    Collection site RIGHT BRACHIAL    Drawn by [id number]    Allens test (pass/fail) NOT APPLICABLE (A) PASS   *Note: Due to a large number of results and/or encounters for the requested time period, some results have not been displayed. A complete set of results can be found in Results Review.    Assessment & Plan:   Problem List Items Addressed This Visit   None Visit Diagnoses     Acute gout of ankle, unspecified cause, unspecified laterality    -  Primary   Relevant Medications   colchicine 0.6 MG tablet   Lumbosacral radiculopathy           With the uric acid being so high, it is probable that she has gout in her ankles.  She does have a very poor pain threshold because of her chronic pain medication so it is hard to assess if it is going up higher or not.  She may have some component of neuropathic pain from her chronic back issues as well.  Will treat with colchicine that she could use as needed.  Follow-up with PCP in 1 to 2 months Follow  up plan: Return if symptoms worsen or fail to improve, for Follow-up gout and ankle pain with PCP in 1 to 2 months.  Counseling provided for all of the vaccine components No orders of the defined types were placed in this encounter.   Caryl Pina, MD [facility] 07/17/2022, 12:40 PM

## 2022-07-18 ENCOUNTER — Inpatient Hospital Stay: Payer: Medicare Other

## 2022-07-18 DIAGNOSIS — F319 Bipolar disorder, unspecified: Secondary | ICD-10-CM | POA: Diagnosis not present

## 2022-07-18 DIAGNOSIS — I5033 Acute on chronic diastolic (congestive) heart failure: Secondary | ICD-10-CM | POA: Diagnosis not present

## 2022-07-18 DIAGNOSIS — F419 Anxiety disorder, unspecified: Secondary | ICD-10-CM | POA: Diagnosis not present

## 2022-07-18 DIAGNOSIS — J9621 Acute and chronic respiratory failure with hypoxia: Secondary | ICD-10-CM | POA: Diagnosis not present

## 2022-07-18 DIAGNOSIS — D179 Benign lipomatous neoplasm, unspecified: Secondary | ICD-10-CM | POA: Diagnosis not present

## 2022-07-18 DIAGNOSIS — G4700 Insomnia, unspecified: Secondary | ICD-10-CM | POA: Diagnosis not present

## 2022-07-18 DIAGNOSIS — I272 Pulmonary hypertension, unspecified: Secondary | ICD-10-CM | POA: Diagnosis not present

## 2022-07-18 DIAGNOSIS — I110 Hypertensive heart disease with heart failure: Secondary | ICD-10-CM | POA: Diagnosis not present

## 2022-07-18 DIAGNOSIS — K7581 Nonalcoholic steatohepatitis (NASH): Secondary | ICD-10-CM | POA: Diagnosis not present

## 2022-07-18 DIAGNOSIS — K760 Fatty (change of) liver, not elsewhere classified: Secondary | ICD-10-CM | POA: Diagnosis not present

## 2022-07-18 DIAGNOSIS — G8929 Other chronic pain: Secondary | ICD-10-CM | POA: Diagnosis not present

## 2022-07-18 DIAGNOSIS — G2581 Restless legs syndrome: Secondary | ICD-10-CM | POA: Diagnosis not present

## 2022-07-18 DIAGNOSIS — G3184 Mild cognitive impairment, so stated: Secondary | ICD-10-CM | POA: Diagnosis not present

## 2022-07-18 DIAGNOSIS — I482 Chronic atrial fibrillation, unspecified: Secondary | ICD-10-CM | POA: Diagnosis not present

## 2022-07-18 DIAGNOSIS — G4733 Obstructive sleep apnea (adult) (pediatric): Secondary | ICD-10-CM | POA: Diagnosis not present

## 2022-07-18 DIAGNOSIS — G43109 Migraine with aura, not intractable, without status migrainosus: Secondary | ICD-10-CM | POA: Diagnosis not present

## 2022-07-18 DIAGNOSIS — E1121 Type 2 diabetes mellitus with diabetic nephropathy: Secondary | ICD-10-CM | POA: Diagnosis not present

## 2022-07-18 DIAGNOSIS — M545 Low back pain, unspecified: Secondary | ICD-10-CM | POA: Diagnosis not present

## 2022-07-18 DIAGNOSIS — I2510 Atherosclerotic heart disease of native coronary artery without angina pectoris: Secondary | ICD-10-CM | POA: Diagnosis not present

## 2022-07-18 DIAGNOSIS — E7800 Pure hypercholesterolemia, unspecified: Secondary | ICD-10-CM | POA: Diagnosis not present

## 2022-07-18 DIAGNOSIS — E860 Dehydration: Secondary | ICD-10-CM | POA: Diagnosis not present

## 2022-07-18 DIAGNOSIS — G9341 Metabolic encephalopathy: Secondary | ICD-10-CM | POA: Diagnosis not present

## 2022-07-18 DIAGNOSIS — E669 Obesity, unspecified: Secondary | ICD-10-CM | POA: Diagnosis not present

## 2022-07-18 DIAGNOSIS — E1140 Type 2 diabetes mellitus with diabetic neuropathy, unspecified: Secondary | ICD-10-CM | POA: Diagnosis not present

## 2022-07-18 DIAGNOSIS — J4520 Mild intermittent asthma, uncomplicated: Secondary | ICD-10-CM | POA: Diagnosis not present

## 2022-07-21 ENCOUNTER — Encounter: Payer: Medicare Other | Admitting: Physical Medicine and Rehabilitation

## 2022-07-21 ENCOUNTER — Telehealth: Payer: Self-pay | Admitting: Physical Medicine and Rehabilitation

## 2022-07-21 MED ORDER — MORPHINE SULFATE ER 15 MG PO TBCR: 15.0000 mg | EXTENDED_RELEASE_TABLET | Freq: Two times a day (BID) | ORAL | 0 refills | Status: DC

## 2022-07-21 NOTE — Addendum Note (Signed)
Addended by: Jennye Boroughs on: 07/21/2022 11:47 PM   Modules accepted: Orders

## 2022-07-21 NOTE — Telephone Encounter (Signed)
Patient needs a refill on Morphine.

## 2022-07-22 ENCOUNTER — Telehealth: Payer: Self-pay | Admitting: Cardiology

## 2022-07-22 ENCOUNTER — Ambulatory Visit: Payer: Medicare Other | Admitting: Cardiology

## 2022-07-22 NOTE — Telephone Encounter (Signed)
Pt c/o medication issue:  1. Name of Medication:  Diltiazem  2. How are you currently taking this medication (dosage and times per day)?   3. Are you having a reaction (difficulty breathing--STAT)?   4. What is your medication issue?   Patient would like to know if she should still be taking Diltiazem. She states she is completely out of medication and she would like to know whether she will need to request refills.

## 2022-07-22 NOTE — Telephone Encounter (Signed)
Informed patient that medication was d/c by PCP and she may need to contact their office in regards to medication. Patient verbalized understanding. Pt had no further questions, concerns at this time.

## 2022-07-23 DIAGNOSIS — I272 Pulmonary hypertension, unspecified: Secondary | ICD-10-CM | POA: Diagnosis not present

## 2022-07-23 DIAGNOSIS — I5033 Acute on chronic diastolic (congestive) heart failure: Secondary | ICD-10-CM | POA: Diagnosis not present

## 2022-07-23 DIAGNOSIS — I482 Chronic atrial fibrillation, unspecified: Secondary | ICD-10-CM | POA: Diagnosis not present

## 2022-07-23 DIAGNOSIS — E860 Dehydration: Secondary | ICD-10-CM | POA: Diagnosis not present

## 2022-07-23 DIAGNOSIS — I110 Hypertensive heart disease with heart failure: Secondary | ICD-10-CM | POA: Diagnosis not present

## 2022-07-23 DIAGNOSIS — J9621 Acute and chronic respiratory failure with hypoxia: Secondary | ICD-10-CM | POA: Diagnosis not present

## 2022-07-28 ENCOUNTER — Ambulatory Visit: Payer: Self-pay | Admitting: *Deleted

## 2022-07-28 NOTE — Patient Outreach (Signed)
  Care Coordination   07/28/2022 Name: SAFA KOWNACKI MRN: [id number] DOB: March 09, 1946   Care Coordination Outreach Attempts:  An unsuccessful telephone outreach was attempted for a scheduled appointment today.  Follow Up Plan:  Additional outreach attempts will be made to offer the patient care coordination information and services.   Encounter Outcome:  No Answer. HIPAA compliant VM left.   Care Coordination Interventions:  No, not indicated    Chong Sicilian, BSN, RN-BC RN Care Coordinator [place] Direct Dial: [phone] Main #: [phone]

## 2022-07-29 ENCOUNTER — Other Ambulatory Visit: Payer: Self-pay | Admitting: Family Medicine

## 2022-07-30 ENCOUNTER — Ambulatory Visit: Payer: Medicare Other | Admitting: Family Medicine

## 2022-07-30 ENCOUNTER — Ambulatory Visit: Payer: Self-pay | Admitting: *Deleted

## 2022-07-30 ENCOUNTER — Telehealth: Payer: Self-pay | Admitting: *Deleted

## 2022-07-30 NOTE — Progress Notes (Signed)
  Care Coordination Note  07/30/2022 Name: Amber Stephenson MRN: [id number] DOB: 1946-05-05  Amber Stephenson is a 77 y.o. year old female who is a primary care patient of Stacks, Cletus Gash, MD and is actively engaged with the care management team. I reached out to Dimas Aguas by phone today to assist with re-scheduling a follow up visit with the RN Case Manager  Follow up plan: Successful telephone outreach attempt made, will call patient back to reschedule  [place]: [phone] .

## 2022-07-30 NOTE — Patient Outreach (Signed)
  Care Coordination   07/30/2022 Name: Amber Stephenson MRN: [id number] DOB: Jul 27, 1945   Care Coordination Outreach Attempts:  An unsuccessful telephone outreach was attempted today to offer the patient information about available care coordination services as a benefit of their health plan.   Follow Up Plan:  Additional outreach attempts will be made to offer the patient care coordination information and services.   Encounter Outcome:  No Answer   Care Coordination Interventions:  No, not indicated    Errick Salts L. Lavina Hamman, RN, BSN, [place] Coordinator Office number [phone]

## 2022-07-30 NOTE — Patient Outreach (Signed)
  Care Coordination   07/30/2022 Name: Amber Stephenson MRN: [id number] DOB: [birth year]   Care Coordination Outreach Attempts:  An unsuccessful telephone outreach was attempted today to offer the patient information about available care coordination services as a benefit of their health plan.   [facility] CMA notified RN CM of a reported fall while CMA was attempting to rescheduled a missed [facility] scheduled appointment. RN CM called to the home number first then their mobile number Review of future appointments indicates a scheduled appointment at 1:30 pm today 07/30/22 at the pcp office   Follow Up Plan:  Additional outreach attempts will be made to offer the patient care coordination information and services.   Encounter Outcome:  No Answer   Care Coordination Interventions:  No, not indicated    Denese Mentink L. Lavina Hamman, RN, BSN, [place] Coordinator Office number [phone]

## 2022-07-31 ENCOUNTER — Encounter: Payer: Self-pay | Admitting: Family Medicine

## 2022-07-31 DIAGNOSIS — E860 Dehydration: Secondary | ICD-10-CM | POA: Diagnosis not present

## 2022-07-31 DIAGNOSIS — J9621 Acute and chronic respiratory failure with hypoxia: Secondary | ICD-10-CM | POA: Diagnosis not present

## 2022-07-31 DIAGNOSIS — I482 Chronic atrial fibrillation, unspecified: Secondary | ICD-10-CM | POA: Diagnosis not present

## 2022-07-31 DIAGNOSIS — I5033 Acute on chronic diastolic (congestive) heart failure: Secondary | ICD-10-CM | POA: Diagnosis not present

## 2022-07-31 DIAGNOSIS — I110 Hypertensive heart disease with heart failure: Secondary | ICD-10-CM | POA: Diagnosis not present

## 2022-07-31 DIAGNOSIS — I272 Pulmonary hypertension, unspecified: Secondary | ICD-10-CM | POA: Diagnosis not present

## 2022-08-01 ENCOUNTER — Encounter: Payer: Self-pay | Admitting: Family Medicine

## 2022-08-01 ENCOUNTER — Telehealth (INDEPENDENT_AMBULATORY_CARE_PROVIDER_SITE_OTHER): Payer: Medicare Other | Admitting: Family Medicine

## 2022-08-01 DIAGNOSIS — R4701 Aphasia: Secondary | ICD-10-CM

## 2022-08-01 DIAGNOSIS — R410 Disorientation, unspecified: Secondary | ICD-10-CM | POA: Diagnosis not present

## 2022-08-01 DIAGNOSIS — R42 Dizziness and giddiness: Secondary | ICD-10-CM

## 2022-08-01 DIAGNOSIS — S0990XA Unspecified injury of head, initial encounter: Secondary | ICD-10-CM

## 2022-08-01 DIAGNOSIS — W19XXXA Unspecified fall, initial encounter: Secondary | ICD-10-CM | POA: Diagnosis not present

## 2022-08-01 NOTE — Progress Notes (Signed)
   Virtual Visit  Note Due to COVID-19 pandemic this visit was conducted virtually. This visit type was conducted due to national recommendations for restrictions regarding the COVID-19 Pandemic (e.g. social distancing, sheltering in place) in an effort to limit this patient's exposure and mitigate transmission in our community. All issues noted in this document were discussed and addressed.  A physical exam was not performed with this format.  I connected with Amber Stephenson on 08/01/22 at 10:33 by telephone and verified that I am speaking with the correct person using two identifiers. Amber Stephenson is currently located at home and no one is currently with her during the visit. The provider, Gwenlyn Perking, FNP is located in their office at time of visit.  I discussed the limitations, risks, security and privacy concerns of performing an evaluation and management service by telephone and the availability of in person appointments. I also discussed with the patient that there may be a patient responsible charge related to this service. The patient expressed understanding and agreed to proceed.   History and Present Illness:  Fall The accident occurred 3 to 5 days ago. The fall occurred from a stool. She landed on Hard floor. The point of impact was the head, right hip and right knee. The pain is present in the right lower leg, right hip, right knee and right foot. The pain is moderate. The symptoms are aggravated by ambulation and standing. Pertinent negatives include no loss of consciousness, nausea, numbness, tingling, visual change or vomiting. Associated symptoms comments: Confusion, forgetfullness.    She reports confusion and forgetfulness since this fall. She denies HA, changes in her vision. She also reports dizziness. Denies focal weakness, numbness, or tingling. She has had difficulty with word finding as well. She is anticoagulated.   Review of Systems  Gastrointestinal:   Negative for nausea and vomiting.  Neurological:  Negative for tingling, loss of consciousness and numbness.     Observations/Objective: Alert and oriented x 3. Aphasia noted during conversation.    Assessment and Plan: Diagnoses and all orders for this visit:  Injury of head, initial encounter  Fall, initial encounter  Confusion  Dizziness  Aphasia   Fall 3 days ago with head injury due to dizziness. Denies LOC. Reports confusion, dizziness, and forgetfulness since. Word finding difficulty noted during visit. Instructed to to to ER now due to r/o stroke. She is home alone but reports that she will call EMS.   Follow Up Instructions: She will call EMS to go to ER or further evaluation.     I discussed the assessment and treatment plan with the patient. The patient was provided an opportunity to ask questions and all were answered. The patient agreed with the plan and demonstrated an understanding of the instructions.   The patient was advised to call back or seek an in-person evaluation if the symptoms worsen or if the condition fails to improve as anticipated.  The above assessment and management plan was discussed with the patient. The patient verbalized understanding of and has agreed to the management plan. Patient is aware to call the clinic if symptoms persist or worsen. Patient is aware when to return to the clinic for a follow-up visit. Patient educated on when it is appropriate to go to the emergency department.   Time call ended:  10:50  I provided 17 minutes of  non face-to-face time during this encounter.    Gwenlyn Perking, FNP

## 2022-08-04 ENCOUNTER — Other Ambulatory Visit: Payer: Self-pay

## 2022-08-04 DIAGNOSIS — R809 Proteinuria, unspecified: Secondary | ICD-10-CM | POA: Diagnosis not present

## 2022-08-04 DIAGNOSIS — Z5181 Encounter for therapeutic drug level monitoring: Secondary | ICD-10-CM | POA: Diagnosis not present

## 2022-08-04 DIAGNOSIS — E1122 Type 2 diabetes mellitus with diabetic chronic kidney disease: Secondary | ICD-10-CM | POA: Diagnosis not present

## 2022-08-04 DIAGNOSIS — N189 Chronic kidney disease, unspecified: Secondary | ICD-10-CM | POA: Diagnosis not present

## 2022-08-04 DIAGNOSIS — Z6837 Body mass index (BMI) 37.0-37.9, adult: Secondary | ICD-10-CM | POA: Diagnosis not present

## 2022-08-04 DIAGNOSIS — E6609 Other obesity due to excess calories: Secondary | ICD-10-CM | POA: Diagnosis not present

## 2022-08-04 DIAGNOSIS — I129 Hypertensive chronic kidney disease with stage 1 through stage 4 chronic kidney disease, or unspecified chronic kidney disease: Secondary | ICD-10-CM | POA: Diagnosis not present

## 2022-08-04 DIAGNOSIS — E876 Hypokalemia: Secondary | ICD-10-CM | POA: Diagnosis not present

## 2022-08-04 DIAGNOSIS — E1129 Type 2 diabetes mellitus with other diabetic kidney complication: Secondary | ICD-10-CM | POA: Diagnosis not present

## 2022-08-04 MED ORDER — FREESTYLE LITE TEST VI STRP: ORAL_STRIP | 5 refills | Status: DC

## 2022-08-04 NOTE — Progress Notes (Signed)
  Care Coordination Note  08/04/2022 Name: Amber Stephenson MRN: [id number] DOB: 01/21/1946  Amber Stephenson is a 77 y.o. year old female who is a primary care patient of Stacks, Cletus Gash, MD and is actively engaged with the care management team. I reached out to Dimas Aguas by phone today to assist with re-scheduling a follow up visit with the RN Case Manager  Follow up plan: Telephone appointment with care management team member scheduled for:08-19-22  [place]  Direct Dial: [phone]

## 2022-08-05 DIAGNOSIS — I272 Pulmonary hypertension, unspecified: Secondary | ICD-10-CM | POA: Diagnosis not present

## 2022-08-05 DIAGNOSIS — J9621 Acute and chronic respiratory failure with hypoxia: Secondary | ICD-10-CM | POA: Diagnosis not present

## 2022-08-05 DIAGNOSIS — I5033 Acute on chronic diastolic (congestive) heart failure: Secondary | ICD-10-CM | POA: Diagnosis not present

## 2022-08-05 DIAGNOSIS — I110 Hypertensive heart disease with heart failure: Secondary | ICD-10-CM | POA: Diagnosis not present

## 2022-08-05 DIAGNOSIS — I482 Chronic atrial fibrillation, unspecified: Secondary | ICD-10-CM | POA: Diagnosis not present

## 2022-08-05 DIAGNOSIS — E860 Dehydration: Secondary | ICD-10-CM | POA: Diagnosis not present

## 2022-08-06 ENCOUNTER — Encounter: Payer: Self-pay | Admitting: Family Medicine

## 2022-08-07 DIAGNOSIS — F251 Schizoaffective disorder, depressive type: Secondary | ICD-10-CM | POA: Diagnosis not present

## 2022-08-08 ENCOUNTER — Ambulatory Visit: Payer: Medicare Other | Admitting: Psychology

## 2022-08-08 ENCOUNTER — Ambulatory Visit (INDEPENDENT_AMBULATORY_CARE_PROVIDER_SITE_OTHER): Payer: Medicare Other | Admitting: Psychology

## 2022-08-08 ENCOUNTER — Encounter: Payer: Self-pay | Admitting: Psychology

## 2022-08-08 ENCOUNTER — Other Ambulatory Visit (HOSPITAL_COMMUNITY): Payer: Self-pay

## 2022-08-08 DIAGNOSIS — F0670 Mild neurocognitive disorder due to known physiological condition without behavioral disturbance: Secondary | ICD-10-CM | POA: Diagnosis not present

## 2022-08-08 DIAGNOSIS — M797 Fibromyalgia: Secondary | ICD-10-CM

## 2022-08-08 DIAGNOSIS — I679 Cerebrovascular disease, unspecified: Secondary | ICD-10-CM

## 2022-08-08 DIAGNOSIS — F330 Major depressive disorder, recurrent, mild: Secondary | ICD-10-CM

## 2022-08-08 DIAGNOSIS — I999 Unspecified disorder of circulatory system: Secondary | ICD-10-CM | POA: Diagnosis not present

## 2022-08-08 DIAGNOSIS — R4189 Other symptoms and signs involving cognitive functions and awareness: Secondary | ICD-10-CM

## 2022-08-08 DIAGNOSIS — F411 Generalized anxiety disorder: Secondary | ICD-10-CM

## 2022-08-08 DIAGNOSIS — N289 Disorder of kidney and ureter, unspecified: Secondary | ICD-10-CM | POA: Insufficient documentation

## 2022-08-08 NOTE — Progress Notes (Signed)
   Psychometrician Note   Cognitive testing was administered to Amber Stephenson by Amber Stephenson, B.S. (psychometrist) under the supervision of Dr. Christia Stephenson, Ph.D., licensed psychologist on 08/08/2022. Amber Stephenson did not appear overtly distressed by the testing session per behavioral observation or responses across self-report questionnaires. Rest breaks were offered.    The battery of tests administered was selected by Dr. Christia Stephenson, Ph.D. with consideration to Amber Stephenson's current level of functioning, the nature of her symptoms, emotional and behavioral responses during interview, level of literacy, observed level of motivation/effort, and the nature of the referral question. This battery was communicated to the psychometrist. Communication between Dr. Christia Stephenson, Ph.D. and the psychometrist was ongoing throughout the evaluation and Dr. Christia Stephenson, Ph.D. was immediately accessible at all times. Dr. Christia Stephenson, Ph.D. provided supervision to the psychometrist on the date of this service to the extent necessary to assure the quality of all services provided.    Amber Stephenson will return within approximately 1-2 weeks for an interactive feedback session with Amber Stephenson at which time her test performances, clinical impressions, and treatment recommendations will be reviewed in detail. Amber Stephenson understands she can contact our office should she require our assistance before this time.  A total of 110 minutes of billable time were spent face-to-face with Amber Stephenson by the psychometrist. This includes both test administration and scoring time. Billing for these services is reflected in the clinical report generated by Dr. Christia Stephenson, Ph.D.  This note reflects time spent with the psychometrician and does not include test scores or any clinical interpretations made by Amber Stephenson. The full report will follow in a separate note.

## 2022-08-08 NOTE — Progress Notes (Addendum)
NEUROPSYCHOLOGICAL EVALUATION [place] Department of Neurology  Date of Evaluation: August 08, 2022  Reason for Referral:   LYLAH HEGSTAD is a 77 y.o. right-handed Caucasian female referred by [name], PA-C, to characterize her current cognitive functioning and assist with diagnostic clarity and treatment planning in the context of subjective cognitive decline and a previously diagnosed mild neurocognitive disorder.   Assessment and Plan:   Clinical Impression(s): Scores across stand-alone and embedded performance validity measures were variable. As such, while there remains the potential that current scores reflect true impairment and progressive decline (see below), these also may reflect Ms. Brigitte Pulse occasionally zoning out while attempting cognitive tasks, coupled with significant psychiatric distress, acute on chronic pain, and reported sleep dysfunction. Observationally, recall across memory testing was also odd/unexpected. For example, Ms. Hammitt immediate recall of story content was very far off from themes of the original story she had heard just seconds before (e.g., describing she and her mother going to pick apples when actual story content focused around life-long friends eating at a diner). Overall, it would be prudent to interpret current scores with caution as lower scores may underestimate true abilities to an unknown degree.  If taken at face value, Ms. Alperin pattern of performance is suggestive of diffuse impairment impacting nearly all assessed cognitive domains. Performances were appropriate across confrontation naming and visuospatial abilities, while some variability was seen across recognition/consolidation aspects of memory. Severe impairment was exhibited across all other assessed domains. This includes processing speed, attention/concentration, executive functioning, receptive language, and verbal fluency (both phonemic and  semantic). Relative to her previous evaluation in November 2020, her most prominent decline was exhibited across executive functioning and encoding/retrieval aspects of verbal memory. More modest decline was exhibited across processing speed. Performance was stable across other domains. Functionally, Ms. Barclift denied any changes relative to her previous November 2020 evaluation where she was diagnosed with a Mild Neurocognitive Disorder ("mild cognitive impairment").  The etiology for ongoing cognitive dysfunction remains unclear and difficult to pinpoint. This is especially true given validity concerns surrounding the current evaluation. Ms. Hurless has an enormous amount of ailments listed in her medical history, including many which could certainly compromise cognitive functioning. This is especially true surrounding all cardiovascular related ailments, as well as prior neuroimaging suggesting age advanced microvascular ischemic disease. Acutely, she described severe anxiety and mild depression, as well as moderate sleep dysfunction. She also was noted to be actively grimacing and in acute, significant pain stemming from a recent fall and orthopedic bruising/injury just weeks before. She is also currently prescribed an extremely large number of medications. All of these factors will certainly impact her current degree of functioning and may also be influencing the validity of currently obtained data. There remains the potential of the combination of these factors representing the primary cause for ongoing cognitive impairment and day-to-day subjective dysfunction.  Due to validity concerns and the interference of the variables described above, I cannot theorize with confidence the likelihood of an underlying neurodegenerative illness. Regarding concerns for Alzheimer's disease, recent neuroimaging did suggest some progressive temporal lobe atrophy, which can be a risk factor for the presence of this  illness. Ms. Preston also also stated several times during interview that she had no recollection of previously completing a multi-hour evaluation with myself. While this evaluation was admittedly 3.5 years prior, this also does raise some concern. However, current testing patterns, even when viewed without noted validity concerns, do not fully align with  this disease process. Namely, she was able to demonstrate intact storage abilities across 2/3 memory tasks, confrontation naming and visuospatial abilities were stable and strong, and while both were impaired, semantic fluency was mildly improved relative to phonemic fluency. Overall, while this illness cannot be ruled out, I continue to be unable to rule it in with any degree of confidence presently. She does not display behavioral symptoms of other neurodegenerative illnesses such as Lewy body disease or frontotemporal lobar degeneration. Continued medical monitoring will be important moving forward, especially given temporal lobe atrophy findings.   Recommendations: Given the extent of medical and psychiatric comorbidities and influence of polypharmacy, diagnostic clarity surrounding Alzheimer's disease based solely on cognitive testing will continue to be quite challenging. If there are greater concerns surrounding this illness, I would recommend she and her medical team consider other tests, including a lumbar puncture, additional neuroimaging in the form of a PET scan, or blood tests.   If desired, Ms. Busko could discuss medications aimed to address memory loss and concerns surrounding Alzheimer's disease with Ms. Wertman given her perception of progressive cognitive decline. It is important to highlight that these medications have been shown to slow functional decline in some individuals. There is no current treatment which can stop or reverse cognitive decline when caused by a neurodegenerative illness.   A combination of medication and  psychotherapy has been shown to be most effective at treating symptoms of anxiety and depression. As such, Ms. Brush is encouraged to speak with her prescribing physician regarding medication adjustments to optimally manage these symptoms. Likewise, Ms. Ogbonna is encouraged to consider engaging in short-term psychotherapy to address symptoms of psychiatric distress. She would benefit from an active and collaborative therapeutic environment, rather than one purely supportive in nature. Recommended treatment modalities include Cognitive Behavioral Therapy (CBT) or Acceptance and Commitment Therapy (ACT).  Performance across neurocognitive testing is not a strong predictor of an individual's safety operating a motor vehicle. Should her family wish to pursue a formalized driving evaluation, they could reach out to the following agencies: [facility] [employer] in [place]: [phone] Driver Rehabilitative Services: [facility]: [place]: [phone] or [phone]  Should there be progression of current deficits over time, Ms. Bauknight is unlikely to regain any independent living skills lost. Therefore, it is recommended that she remain as involved as possible in all aspects of household chores, finances, and medication management, with supervision to ensure adequate performance. She will likely benefit from the establishment and maintenance of a routine in order to maximize her functional abilities over time.  It will be important for Ms. Scheuring to have another person with her when in situations where she may need to process information, weigh the pros and cons of different options, and make decisions, in order to ensure that she fully understands and recalls all information to be considered.  If not already done, Ms. Brookens and her family may want to discuss her wishes regarding durable power of attorney and medical decision making, so that she  can have input into these choices. If they require legal assistance with this, long-term care resource access, or other aspects of estate planning, they could reach out to [facility] at [phone] for a free consultation. Additionally, they may wish to discuss future plans for caretaking and seek out community options for in home/residential care should they become necessary.  Ms. Gamelin is encouraged to attend to lifestyle factors for brain health (e.g., regular physical exercise, good nutrition  habits and consideration of the MIND-DASH diet, regular participation in cognitively-stimulating activities, and general stress management techniques), which are likely to have benefits for both emotional adjustment and cognition. In fact, in addition to promoting good general health, regular exercise incorporating aerobic activities (e.g., brisk walking, jogging, cycling, etc.) has been demonstrated to be a very effective treatment for depression and stress, with similar efficacy rates to both antidepressant medication and psychotherapy. Optimal control of vascular risk factors (including safe cardiovascular exercise and adherence to dietary recommendations) is encouraged. Continued participation in activities which provide mental stimulation and social interaction is also recommended.   Important information should be provided to Ms. Lucks in written format in all instances. This information should be placed in a highly frequented and easily visible location within her home to promote recall. External strategies such as written notes in a consistently used memory journal, visual and nonverbal auditory cues such as a calendar on the refrigerator or appointments with alarm, such as on a cell phone, can also help maximize recall.  To address problems with processing speed, she may wish to consider:   -Ensuring that she is alerted when essential material or instructions are being presented   -Adjusting  the speed at which new information is presented   -Allowing for more time in comprehending, processing, and responding in conversation   -Repeating and paraphrasing instructions or conversations aloud  To address problems with fluctuating attention and/or executive dysfunction, she may wish to consider:   -Avoiding external distractions when needing to concentrate   -Limiting exposure to fast paced environments with multiple sensory demands   -Writing down complicated information and using checklists   -Attempting and completing one task at a time (i.e., no multi-tasking)   -Verbalizing aloud each step of a task to maintain focus   -Taking frequent breaks during the completion of steps/tasks to avoid fatigue   -Reducing the amount of information considered at one time   -Scheduling more difficult activities for a time of day where she is usually most alert  Review of Records:   Ms. Pizzolato was seen by [facility] Marland KitchenEllouise Newer, M.D.) on 01/26/2019 for follow-up of memory concerns. At this time, her husband noted that Ms. Frane's memory was "pretty good." She acknowledged some trouble with generalized forgetfulness. Word-finding difficulties were said to be present, but "nothing major." Her husband was said to manage medications and personal finances, as well as assist with dressing and bathing (the latter were due to balance difficulties). She has a longstanding history of back and shoulder pain. Mood-wise, she reported a history of well-managed bipolar I disorder. A remote history of headaches were reported; however, these have been improved via oral medications. Ultimately, Ms. Clifton was referred for a comprehensive neuropsychological evaluation to characterize her cognitive abilities and to assist with diagnostic clarity and treatment planning.  She completed a comprehensive neuropsychological evaluation with myself on 04/20/2019. Results suggested primary areas of impairment  surrounding processing speed and attention/concentration. Deficits in these areas likely impact other areas of weakness, including verbal fluency, some aspects of executive functioning (i.e., those tests with a timed component), and encoding (i.e., learning) of visual information. She was ultimately diagnosed with a mild neurocognitive disorder. The underlying etiology was believed to be multifactorial, emphasizing her numerous medical/cardiovascular ailments and longstanding psychiatric distress. Strong concerns for Alzheimer's disease were not expressed at that time. Repeat testing in 18-24 months was recommended.   She has been followed by Dr. Delice Lesch and more recently [name], PA-C,  to address ongoing cognitive concerns. She most recently met with Ms. Wertman on 06/04/2022 for follow-up. Short-term memory concerns surrounding trouble recalling recent conversations and names have persisted. She also described losing "chunks of time," as well as waking in the middle of the night and reportedly forgetting what she ate for dinner. Performance on a brief cognitive screening instrument (MMSE) was 28/30 during a prior appointment on 11/07/2021. Ultimately, Ms. Cheaney was referred for a repeat neuropsychological evaluation to characterize her cognitive abilities and to assist with diagnostic clarity and treatment planning.   Ms. Goodie has had several recent hospitalizations to note. She was seen in the ED on 06/08/2022 for worsening shortness of breath for the prior two days. She had been noncompliant with her diuretics and has had some dietary indiscretion. She was admitted for acute respiratory failure with hypoxia in the setting of acute on chronic diastolic CHF exacerbation and had been placed on IV Lasix. She was treated and ultimately discharged on 06/16/2022. She was then seen in the ED on 06/27/2022 due to generalized weakness and hypersomnolence. She was noted to be quite hypotensive and much of this  appeared to be related to her recent increase in Lasix dosage, as well as change in home narcotic medications from Dilaudid to morphine long-acting. She required a right IJ central venous line placement and initiation of norepinephrine for blood pressure support as well as aggressive IV fluids. Her blood pressure and kidney function quickly improved with aggressive IV fluids and she was discharged on 06/29/2022. Finally, she was seen in the ED on 07/11/2022 due to localized and worsening pain to her right foot, as well as left upper quadrant abdominal pain of unclear etiology. Labs were significant for a uric acid level of 13.2 and she was diagnosed with gout.    Brain MRI on 05/13/2017 revealed periventricular and subcortical T2 hyperintensities moderately advanced for her age. Head CT on 08/10/2017 revealed chronic small vessel ischemic disease. Head CT on 04/23/2020 in the context of dizziness after a fall was negative. Brain MRI on 04/23/2020 was stable relative to microvascular disease and also suggested mild diffuse cerebral atrophy. Brain MRI on 04/29/2021 again revealed mild atrophy but did suggest slightly greater degrees of atrophy within the right temporal lobe. Head CT on 11/11/2021 in the context of reported head trauma was negative. Head CT on 11/21/2021 in the context of head trauma was also negative.   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   Benign paroxysmal positional vertigo due to bilateral  vestibular disorder 05/20/2019   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   Bipolar 1 disorder 01/23/2015   CAD (coronary artery disease)    Nonobstructive; Managed by Dr. Bronson Ing   Cardiomegaly 01/12/2018   CHF (congestive heart failure) 06/08/2022   Chronic anticoagulation 05/30/2015   Chronic back pain    Lower back   Chronic constipation 04/25/2020   Chronic diastolic heart failure [id number]   Chronic pain syndrome 08/22/2019   Chronic post-traumatic stress disorder (PTSD) 12/06/2020   Chronic respiratory failure with hypoxia 04/25/2020  Chronic right-sided low back pain with right-sided sciatica 07/19/2021   Cystitis 12/06/2020   Debility 12/06/2020   Diabetic neuropathy 02/06/2016   Dyslipidemia 09/24/2020   Dyspnea on exertion    Dysuria 04/16/2021   Elevated brain natriuretic peptide (BNP) level 12/14/2020   Elevated troponin 12/14/2020   Essential hypertension    Essential hypertriglyceridemia 12/06/2020   Fibromyalgia 03/19/2017   Functional diarrhea 10/26/2020   Generalized anxiety disorder 12/06/2020   Head trauma 09/17/2020   Hemorrhoids 03/11/2022   Herpes genitalis in women 07/16/2015   History of adenomatous polyp of colon 05/21/2019   Overview:   03/31/17: Colonoscopy: nonadvanced adenoma, microscopic colitis, f/u 5 yrs, [facility]   Hypermetropia 12/06/2020   Hypocalcemia    Insomnia 01/23/2015   Lipoma 02/08/2015   Major depressive disorder 10/03/2015   Microalbuminuria 01/21/2022   Migraine headache with aura 02/12/2016   Mild intermittent asthma 01/12/2018   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 Procedure scheduled for 06 FEB 07.   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 Wallace Keller   RLS (restless legs syndrome) 04/27/2015   S/P shoulder replacement, 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   Senile nuclear sclerosis 12/06/2020   Senile osteoporosis 12/15/2017   Sensory ataxia 03/28/2021   Superficial fungus infection of skin 09/03/2021   Tremor, essential 12/11/2021   Type II diabetes mellitus 09/24/2020   UTI (urinary tract infection) 12/14/2020   Vertigo 04/25/2019    Past Surgical History:  Procedure Laterality Date   BREAST REDUCTION SURGERY     EYE SURGERY Right    cateracts   HAMMER TOE SURGERY     LEFT HEART CATH AND CORONARY ANGIOGRAPHY N/A 02/03/2018   Procedure: LEFT HEART CATH AND CORONARY ANGIOGRAPHY;  Surgeon: Martinique, Peter M, MD;  Location: [facility];  Service: Cardiovascular;  Laterality: N/A;   REVERSE SHOULDER ARTHROPLASTY Right 08/19/2019   Procedure: REVERSE SHOULDER ARTHROPLASTY;  Surgeon: Netta Cedars, MD;  Location: [facility];  Service: Orthopedics;  Laterality: Right;  interscalene block   SHOULDER SURGERY Right    "I BROKE MY SHOUDLER   THIGH SURGERY     "TO REMOVE A TUMOR "    Current Outpatient Medications:    Acetaminophen (TYLENOL PO), Take 2 tablets by mouth as needed (pain/headache)., Disp: , Rfl:    albuterol (PROVENTIL) (2.5 MG/3ML) 0.083% nebulizer solution, Take 3 mLs (2.5 mg total) by nebulization every 6 (six) hours as needed for wheezing or shortness of breath. (Patient taking differently: Take 2.5 mg by nebulization as needed for wheezing or shortness of breath.), Disp: 150 mL, Rfl: 0    albuterol (VENTOLIN HFA) 108 (90 Base) MCG/ACT inhaler, Inhale 2 puffs into the lungs every 6 (six) hours as needed for wheezing or shortness of breath. (Patient taking differently: Inhale 2 puffs into the lungs  as needed for wheezing or shortness of breath.), Disp: 1 each, Rfl: 0   apixaban (ELIQUIS) 5 MG TABS tablet, TAKE 1 TABLET TWICE A DAY, Disp: 180 tablet, Rfl: 1   ARIPiprazole (ABILIFY) 2 MG tablet, Take 2 mg by mouth at bedtime., Disp: , Rfl:    atorvastatin (LIPITOR) 40 MG tablet, TAKE 1 TABLET(40 MG) BY MOUTH DAILY (NEEDS TO BE SEEN BEFORE NEXT REFILL), Disp: 30 tablet, Rfl: 0   benztropine (COGENTIN) 1 MG tablet, Take 1 tablet (1 mg total) by mouth 2 (two) times daily., Disp: 60 tablet, Rfl: 2   colchicine 0.6 MG tablet, Take 1 tablet (0.6 mg total) by mouth 2 (two) times daily as needed., Disp: 30 tablet, Rfl: 1   Continuous Blood Gluc Sensor (DEXCOM G7 SENSOR) MISC, Change sensor every 10 days, Disp: 9 each, Rfl: 3   Continuous Blood Gluc Transmit (DEXCOM G6 TRANSMITTER) MISC, Use as instructed to check blood sugar. Change every 90 days, Disp: 1 each, Rfl: 1   denosumab (PROLIA) 60 MG/ML SOSY injection, Inject 60 mg into the skin every 6 (six) months., Disp: 1 mL, Rfl: 1   escitalopram (LEXAPRO) 10 MG tablet, Take 10 mg by mouth daily., Disp: , Rfl:    fluconazole (DIFLUCAN) 150 MG tablet, Take 1 now and can repeat 1 in 3 days, Disp: 2 tablet, Rfl: 0   Fluticasone-Salmeterol (ADVAIR DISKUS) 500-50 MCG/DOSE AEPB, Inhale 1 puff into the lungs in the morning and at bedtime. (Patient taking differently: Inhale 1 puff into the lungs as needed (SOB).), Disp: 60 each, Rfl: 0   furosemide (LASIX) 40 MG tablet, Take 0.5 tablets (20 mg total) by mouth 2 (two) times daily. Take extra 20mg  in am if dyspnea, weight gain of 3 lbs or greater, or edema., Disp: 30 tablet, Rfl: 0   Galcanezumab-gnlm (EMGALITY) 120 MG/ML SOAJ, One injection per month (Patient taking differently: Inject 120 mg into the skin  every 30 (thirty) days.), Disp: 1 mL, Rfl: 0   glucose blood (FREESTYLE LITE) test strip, Check blood sugar 2-3 weekly, Disp: 100 each, Rfl: 5   hydrOXYzine (VISTARIL) 25 MG capsule, Take 1 capsule (25 mg total) by mouth every 8 (eight) hours as needed., Disp: 90 capsule, Rfl: 1   icosapent Ethyl (VASCEPA) 1 g capsule, TAKE 2 CAPSULES TWICE A DAY WITH MEALS (Patient taking differently: Take 2 g by mouth 2 (two) times daily.), Disp: 360 capsule, Rfl: 0   insulin glargine (LANTUS SOLOSTAR) 100 UNIT/ML Solostar Pen, Inject 68 Units into the skin daily. (Patient taking differently: Inject 34 Units into the skin 2 (two) times daily.), Disp: 75 mL, Rfl: 3   insulin lispro (HUMALOG KWIKPEN) 100 UNIT/ML KwikPen, Max daily 50 units (Patient taking differently: Inject 4-8 Units into the skin 3 (three) times daily.), Disp: 45 mL, Rfl: 6   Insulin Pen Needle 29G X 12MM MISC, 1 Device by Does not apply route daily in the afternoon., Disp: 400 each, Rfl: 3   lisinopril (ZESTRIL) 10 MG tablet, Take 1 tablet (10 mg total) by mouth daily., Disp: 90 tablet, Rfl: 3   LORazepam (ATIVAN) 1 MG tablet, Take 1 tablet (1 mg total) by mouth 2 (two) times daily. And give 2 tablets by mouth at bedtime (Patient taking differently: Take 1-2 mg by mouth 2 (two) times daily. Take 2 mg by mouth in the morning and 1 mg by mouth at bedtime), Disp: 90 tablet, Rfl: 0   metFORMIN (GLUCOPHAGE) 500 MG tablet, Take 1 tablet (500 mg  total) by mouth 2 (two) times daily with a meal. TAKE 1 TABLET(500 MG) BY MOUTH TWICE DAILY AFTER A MEAL (Patient taking differently: Take 500 mg by mouth 2 (two) times daily with a meal.), Disp: 180 tablet, Rfl: 3   metoprolol succinate (TOPROL XL) 25 MG 24 hr tablet, Take 1 tablet (25 mg total) by mouth in the morning and at bedtime. (Patient taking differently: Take 25 mg by mouth in the morning and at bedtime. Take with 100 mg tablet twice daily), Disp: 180 tablet, Rfl: 3   metoprolol succinate (TOPROL-XL) 100 MG  24 hr tablet, TAKE 1 TABLET IN THE MORNING AND AT BEDTIME (Patient taking differently: Take 100 mg by mouth in the morning and at bedtime. Take with 25 mg tablet twice daily.), Disp: 180 tablet, Rfl: 3   morphine (MS CONTIN) 15 MG 12 hr tablet, Take 1 tablet (15 mg total) by mouth every 12 (twelve) hours. This is a long acting form, so take 1 tab 2x/day to keep pain more even- it is NOT a short acting pain med like you were taking- for chronic pain, Disp: 60 tablet, Rfl: 0   naloxone (NARCAN) nasal spray 4 mg/0.1 mL, Place 1 spray into the nose once., Disp: , Rfl:    nystatin (MYCOSTATIN/NYSTOP) powder, Apply 1 Application topically as needed (rash)., Disp: 15 g, Rfl: 2   nystatin ointment (MYCOSTATIN), Apply 1 Application topically 2 (two) times daily. (Patient taking differently: Apply 1 Application topically as needed (rash).), Disp: 30 g,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

## 2022-08-11 ENCOUNTER — Other Ambulatory Visit: Payer: Self-pay | Admitting: Physician Assistant

## 2022-08-11 DIAGNOSIS — G894 Chronic pain syndrome: Secondary | ICD-10-CM | POA: Diagnosis not present

## 2022-08-11 DIAGNOSIS — E790 Hyperuricemia without signs of inflammatory arthritis and tophaceous disease: Secondary | ICD-10-CM | POA: Diagnosis not present

## 2022-08-11 DIAGNOSIS — R0600 Dyspnea, unspecified: Secondary | ICD-10-CM | POA: Diagnosis not present

## 2022-08-11 DIAGNOSIS — M2560 Stiffness of unspecified joint, not elsewhere classified: Secondary | ICD-10-CM | POA: Diagnosis not present

## 2022-08-11 DIAGNOSIS — M2540 Effusion, unspecified joint: Secondary | ICD-10-CM | POA: Diagnosis not present

## 2022-08-11 DIAGNOSIS — R768 Other specified abnormal immunological findings in serum: Secondary | ICD-10-CM | POA: Diagnosis not present

## 2022-08-11 DIAGNOSIS — M79671 Pain in right foot: Secondary | ICD-10-CM | POA: Diagnosis not present

## 2022-08-11 DIAGNOSIS — M79672 Pain in left foot: Secondary | ICD-10-CM | POA: Diagnosis not present

## 2022-08-13 DIAGNOSIS — I482 Chronic atrial fibrillation, unspecified: Secondary | ICD-10-CM | POA: Diagnosis not present

## 2022-08-13 DIAGNOSIS — E860 Dehydration: Secondary | ICD-10-CM | POA: Diagnosis not present

## 2022-08-13 DIAGNOSIS — I272 Pulmonary hypertension, unspecified: Secondary | ICD-10-CM | POA: Diagnosis not present

## 2022-08-13 DIAGNOSIS — I5033 Acute on chronic diastolic (congestive) heart failure: Secondary | ICD-10-CM | POA: Diagnosis not present

## 2022-08-13 DIAGNOSIS — J9621 Acute and chronic respiratory failure with hypoxia: Secondary | ICD-10-CM | POA: Diagnosis not present

## 2022-08-13 DIAGNOSIS — I110 Hypertensive heart disease with heart failure: Secondary | ICD-10-CM | POA: Diagnosis not present

## 2022-08-14 ENCOUNTER — Ambulatory Visit (INDEPENDENT_AMBULATORY_CARE_PROVIDER_SITE_OTHER): Payer: Medicare Other | Admitting: Adult Health

## 2022-08-14 ENCOUNTER — Encounter: Payer: Self-pay | Admitting: Adult Health

## 2022-08-14 ENCOUNTER — Encounter: Payer: Medicare Other | Admitting: Psychology

## 2022-08-14 ENCOUNTER — Ambulatory Visit: Payer: Medicare Other | Admitting: Pulmonary Disease

## 2022-08-14 VITALS — BP 90/60 | HR 72 | Temp 98.2°F | Ht 62.0 in | Wt 188.0 lb

## 2022-08-14 DIAGNOSIS — J4520 Mild intermittent asthma, uncomplicated: Secondary | ICD-10-CM

## 2022-08-14 DIAGNOSIS — M79672 Pain in left foot: Secondary | ICD-10-CM | POA: Diagnosis not present

## 2022-08-14 DIAGNOSIS — G4733 Obstructive sleep apnea (adult) (pediatric): Secondary | ICD-10-CM | POA: Diagnosis not present

## 2022-08-14 DIAGNOSIS — J9611 Chronic respiratory failure with hypoxia: Secondary | ICD-10-CM | POA: Diagnosis not present

## 2022-08-14 DIAGNOSIS — S90122A Contusion of left lesser toe(s) without damage to nail, initial encounter: Secondary | ICD-10-CM | POA: Diagnosis not present

## 2022-08-14 DIAGNOSIS — I5032 Chronic diastolic (congestive) heart failure: Secondary | ICD-10-CM

## 2022-08-14 NOTE — Patient Instructions (Addendum)
Set up for home sleep study.  Do not drive sleep  Healthy sleep regimen   Continue on Oxygen 2l/m with activity and At bedtime    Albuterol inhaler As needed    Follow up in 4-6 weeks with PFT and As needed

## 2022-08-14 NOTE — Progress Notes (Signed)
@Patient  ID: Amber Stephenson, female    DOB: 06-08-1945, 77 y.o.   MRN: [id number]  Chief Complaint  Patient presents with   Follow-up    Referring provider: [name], MD  HPI: 77 year old female seen for pulmonary consult July 10, 2022 to reestablish for sleep apnea and asthma Medical history significant for sleep apnea and diastolic congestive heart failure, A Fib , Pulmonary HTN   TEST/EVENTS :  Home sleep study Oct 09, 2018 showed severe sleep apnea with AHI at 40/hour, and SpO2 low at 82%.  May 2019 chest x-ray showed some basilar scarring versus atelectasis January 2024 portable chest x-ray personally reviewed showing cardiomegaly, low lung volumes, interstitial prominence versus atelectasis bases, likely left pleural effusion January 2023 CT chest images independently reviewed showing normal pulmonary parenchyma, some atelectasis in the lingula, multiple calcified left upper lobe granulomas   PFT: August 2019 ratio normal, FVC 1.09 L 41% predicted, total lung capacity 3.59 L 78% predicted, DLCO 14.1 mL 69% predicted   Overnight oxygen testing: January 31, 2018 on room air: O2 saturation less than 88% for 25 minutes, O2 saturation nadir 82%   Labs: August 2019 sed rate 24, aldolase normal 4.9, ANA negative, anti-Jo 1-, centromere negative, CCP negative, rheumatoid factor negative, SSA negative, SSB negative, SCL 70 neg   Path:   Echo: September 2016 echocardiogram LVEF 55 to 60%, left atrium severely dilated, trivial mitral regurgitation, PA systolic pressure 25 mmHg September 2019 echocardiogram normal LVEF but RVSP was 46 mmHg January 2024 TTE: LVEF 65 to 70%, mild LVH, RV systolic function is normal, size is normal, left atrium severely dilated valves okay   Heart Catheterization: February 03, 2018 left heart catheterization: Proximal LAD 40%, mid LAD 30%, first obtuse marginal 30% stenosed, LVEDP moderately elevated (27 mmHg) to     Sleep study: 2020  AHI 40.6, greater than 170 minutes below 88%   Ambulatory oximetry: February 2024 walked 200 feet in clinic on room air and O2 saturation dropped to 84%  08/14/2022 Follow up ;  Asthma and OSA , O2 RF  Patient returns for a 6-week follow-up.  Patient was seen last visit to reestablish for asthma and sleep apnea.  Patient has longstanding severe sleep apnea.  Patient had difficulty tolerating CPAP.  Patient was recommended to restart CPAP last visit.  Unfortunately insurance will not cover a new CPAP machine without repeat sleep study.  Patient has snoring, daytime sleepiness and restless sleep.  Has no history of stroke.  Does have diastolic heart failure with preserved EF.  She also has a history of A-fib.  Patient is on oxygen 2 L with activity and at bedtime.  She denies any increased albuterol use.  She has been set up for PFTs that are pending. She complains that she gets short of breath with activities.  Has low activity tolerance.   Allergies  Allergen Reactions   Iodine Anaphylaxis   Ivp Dye [Iodinated Contrast Media] Anaphylaxis and Swelling    Throat closes    Latex Other (See Comments)    Latex tape pulls skin with it   Tizanidine Other (See Comments)    Weakness, goofy, bad dreams    Immunization History  Administered Date(s) Administered   Fluad Quad(high Dose 65+) 03/04/2019, 04/03/2021, 03/26/2022   Influenza, High Dose Seasonal PF 03/30/2015, 03/17/2016, 03/19/2017, 02/17/2018   Influenza,inj,Quad PF,6+ Mos 03/30/2015, 03/17/2016, 02/29/2020   Moderna Sars-Covid-2 Vaccination 07/18/2019, 08/15/2019, 05/02/2020   Pneumococcal Conjugate-13 11/12/2017   Pneumococcal Polysaccharide-23 05/30/2015  Tdap 07/17/2011   Zoster Recombinat (Shingrix) 01/30/2021, 04/09/2021   Zoster, Live 07/17/2011, 06/02/2012    Past Medical History:  Diagnosis Date   Acquired hammer toe 12/06/2020   Acute exacerbation of CHF (congestive heart failure) [id number]   Acute metabolic  encephalopathy [id number]   Acute respiratory failure with hypoxia 12/16/2020   AKI (acute kidney injury) 06/27/2022   Allergic rhinitis 02/24/2019   Ambulatory dysfunction 04/23/2020   Atrial fibrillation with RVR 05/19/2017   Back pain/sacroiliitis--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   Benign paroxysmal positional vertigo due to bilateral vestibular disorder 05/20/2019   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   Bipolar 1 disorder 01/23/2015   CAD (coronary artery disease)    Nonobstructive; Managed by Dr. Bronson Ing   Cardiomegaly 01/12/2018   CHF (congestive heart failure) 06/08/2022   Chronic anticoagulation 05/30/2015   Chronic back pain    Lower back   Chronic constipation 04/25/2020   Chronic diastolic heart failure [id number]   Chronic pain syndrome 08/22/2019   Chronic post-traumatic stress disorder (PTSD) 12/06/2020   Chronic respiratory failure with hypoxia 04/25/2020   Chronic right-sided low back pain with right-sided sciatica 07/19/2021   Cystitis 12/06/2020   Debility 12/06/2020   Diabetic neuropathy 02/06/2016   Dyslipidemia 09/24/2020   Dyspnea on exertion    Dysuria 04/16/2021   Elevated brain natriuretic peptide (BNP) level 12/14/2020   Elevated troponin 12/14/2020   Essential hypertension    Essential hypertriglyceridemia 12/06/2020   Fibromyalgia 03/19/2017   Functional diarrhea 10/26/2020   Generalized anxiety disorder 12/06/2020   Head trauma 09/17/2020   Hemorrhoids 03/11/2022   Herpes genitalis in women 07/16/2015   History of adenomatous polyp of colon 05/21/2019   Overview:   03/31/17: Colonoscopy: nonadvanced adenoma, microscopic colitis, f/u 5 yrs, [facility]/GAP   Hypermetropia 12/06/2020   Hypocalcemia    Insomnia 01/23/2015   Lipoma 02/08/2015   Major depressive disorder 10/03/2015   Microalbuminuria 01/21/2022   Migraine headache with aura  02/12/2016   Mild intermittent asthma 01/12/2018   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 Procedure scheduled for 06 FEB 07.   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 Wallace Keller   RLS (restless legs syndrome) 04/27/2015   S/P shoulder replacement, 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   Senile nuclear sclerosis 12/06/2020   Senile osteoporosis 12/15/2017   Sensory ataxia 03/28/2021   Superficial fungus infection of skin 09/03/2021   Tremor, essential 12/11/2021   Type II diabetes mellitus 09/24/2020   UTI (urinary tract infection) 12/14/2020   Vertigo 04/25/2019    Tobacco History: Social History   Tobacco Use  Smoking Status Never  Smokeless Tobacco Never   Counseling given: Not Answered   Outpatient Medications Prior to Visit  Medication  Sig Dispense Refill   Acetaminophen (TYLENOL PO) Take 2 tablets by mouth as needed (pain/headache).     albuterol (PROVENTIL) (2.5 MG/3ML) 0.083% nebulizer solution Take 3 mLs (2.5 mg total) by nebulization every 6 (six) hours as needed for wheezing or shortness of breath. (Patient taking differently: Take 2.5 mg by nebulization as needed for wheezing or shortness of breath.) 150 mL 0   albuterol (VENTOLIN HFA) 108 (90 Base) MCG/ACT inhaler Inhale  2 puffs into the lungs every 6 (six) hours as needed for wheezing or shortness of breath. (Patient taking differently: Inhale 2 puffs into the lungs as needed for wheezing or shortness of breath.) 1 each 0   apixaban (ELIQUIS) 5 MG TABS tablet TAKE 1 TABLET TWICE A DAY 180 tablet 1   ARIPiprazole (ABILIFY) 2 MG tablet Take 2 mg by mouth at bedtime.     atorvastatin (LIPITOR) 40 MG tablet TAKE 1 TABLET(40 MG) BY MOUTH DAILY (NEEDS TO BE SEEN BEFORE NEXT REFILL) 30 tablet 0   benztropine (COGENTIN) 1 MG tablet Take 1 tablet (1 mg total) by mouth 2 (two) times daily. 60 tablet 2   colchicine 0.6 MG tablet Take 1 tablet (0.6 mg total) by mouth 2 (two) times daily as needed. 30 tablet 1   Continuous Blood Gluc Sensor (DEXCOM G7 SENSOR) MISC Change sensor every 10 days 9 each 3   Continuous Blood Gluc Transmit (DEXCOM G6 TRANSMITTER) MISC Use as instructed to check blood sugar. Change every 90 days 1 each 1   denosumab (PROLIA) 60 MG/ML SOSY injection Inject 60 mg into the skin every 6 (six) months. 1 mL 1   escitalopram (LEXAPRO) 10 MG tablet Take 10 mg by mouth daily.     fluconazole (DIFLUCAN) 150 MG tablet Take 1 now and can repeat 1 in 3 days 2 tablet 0   Fluticasone-Salmeterol (ADVAIR DISKUS) 500-50 MCG/DOSE AEPB Inhale 1 puff into the lungs in the morning and at bedtime. (Patient taking differently: Inhale 1 puff into the lungs as needed (SOB).) 60 each 0   Galcanezumab-gnlm (EMGALITY) 120 MG/ML SOAJ INJECT CONTENTS OF 1 PEN INTO THE SKIN ONCE MONTHLY 1 mL 0   glucose blood (FREESTYLE LITE) test strip Check blood sugar 2-3 weekly 100 each 5   hydrOXYzine (VISTARIL) 25 MG capsule Take 1 capsule (25 mg total) by mouth every 8 (eight) hours as needed. 90 capsule 1   icosapent Ethyl (VASCEPA) 1 g capsule TAKE 2 CAPSULES TWICE A DAY WITH MEALS (Patient taking differently: Take 2 g by mouth 2 (two) times daily.) 360 capsule 0   insulin glargine (LANTUS SOLOSTAR) 100 UNIT/ML Solostar Pen Inject 68 Units  into the skin daily. (Patient taking differently: Inject 34 Units into the skin 2 (two) times daily.) 75 mL 3   insulin lispro (HUMALOG KWIKPEN) 100 UNIT/ML KwikPen Max daily 50 units (Patient taking differently: Inject 4-8 Units into the skin 3 (three) times daily.) 45 mL 6   Insulin Pen Needle 29G X 12MM MISC 1 Device by Does not apply route daily in the afternoon. 400 each 3   lisinopril (ZESTRIL) 10 MG tablet Take 1 tablet (10 mg total) by mouth daily. 90 tablet 3   LORazepam (ATIVAN) 1 MG tablet Take 1 tablet (1 mg total) by mouth 2 (two) times daily. And give 2 tablets by mouth at bedtime (Patient taking differently: Take 1-2 mg by mouth 2 (two) times daily. Take 2 mg by mouth in the morning and 1 mg by mouth at bedtime)  90 tablet 0   metFORMIN (GLUCOPHAGE) 500 MG tablet Take 1 tablet (500 mg total) by mouth 2 (two) times daily with a meal. TAKE 1 TABLET(500 MG) BY MOUTH TWICE DAILY AFTER A MEAL (Patient taking differently: Take 500 mg by mouth 2 (two) times daily with a meal.) 180 tablet 3   metoprolol succinate (TOPROL XL) 25 MG 24 hr tablet Take 1 tablet (25 mg total) by mouth in the morning and at bedtime. (Patient taking differently: Take 25 mg by mouth in the morning and at bedtime. Take with 100 mg tablet twice daily) 180 tablet 3   metoprolol succinate (TOPROL-XL) 100 MG 24 hr tablet TAKE 1 TABLET IN THE MORNING AND AT BEDTIME (Patient taking differently: Take 100 mg by mouth in the morning and at bedtime. Take with 25 mg tablet twice daily.) 180 tablet 3   morphine (MS CONTIN) 15 MG 12 hr tablet Take 1 tablet (15 mg total) by mouth every 12 (twelve) hours. This is a long acting form, so take 1 tab 2x/day to keep pain more even- it is NOT a short acting pain med like you were taking- for chronic pain 60 tablet 0   naloxone (NARCAN) nasal spray 4 mg/0.1 mL Place 1 spray into the nose once.     nystatin (MYCOSTATIN/NYSTOP) powder Apply 1 Application topically as needed (rash). 15 g 2    nystatin ointment (MYCOSTATIN) Apply 1 Application topically 2 (two) times daily. (Patient taking differently: Apply 1 Application topically as needed (rash).) 30 g 3   ondansetron (ZOFRAN-ODT) 8 MG disintegrating tablet Take 1 tablet (8 mg total) by mouth every 6 (six) hours as needed for nausea or vomiting. 20 tablet 1   OXcarbazepine (TRILEPTAL) 150 MG tablet Take 1 tablet (150 mg total) by mouth 2 (two) times daily. 60 tablet 0   potassium chloride SA (KLOR-CON M) 20 MEQ tablet Take 1 tablet (20 mEq total) by mouth daily. (Patient taking differently: Take 10 mEq by mouth at bedtime.) 30 tablet 5   pregabalin (LYRICA) 200 MG capsule TAKE 1 CAPSULE(200 MG) BY MOUTH TWICE DAILY (Patient taking differently: Take 200 mg by mouth 2 (two) times daily.) 60 capsule 5   senna-docusate (SENOKOT-S) 8.6-50 MG tablet Take 1 tablet by mouth at bedtime. 30 tablet 0   tirzepatide (MOUNJARO) 5 MG/0.5ML Pen Inject 5 mg into the skin once a week.     Vitamin D, Cholecalciferol, 25 MCG (1000 UT) TABS Take 1,000 Units by mouth daily in the afternoon.     furosemide (LASIX) 40 MG tablet Take 0.5 tablets (20 mg total) by mouth 2 (two) times daily. Take extra 20mg  in am if dyspnea, weight gain of 3 lbs or greater, or edema. 30 tablet 0   Facility-Administered Medications Prior to Visit  Medication Dose Route Frequency Provider Last Rate Last Admin   bupivacaine-epinephrine (MARCAINE W/ EPI) 0.5% -1:200000 injection    Anesthesia Intra-op Effie Berkshire, MD   12 mL at 01/21/19 0935     Review of Systems:   Constitutional:   No  weight loss, night sweats,  Fevers, chills,  +fatigue, or  lassitude.  HEENT:   No headaches,  Difficulty swallowing,  Tooth/dental problems, or  Sore throat,                No sneezing, itching, ear ache, nasal congestion, post nasal drip,   CV:  No chest pain,  Orthopnea, PND, swelling in lower extremities, anasarca, dizziness, palpitations, syncope.   GI  No heartburn, indigestion,  abdominal pain, nausea, vomiting, diarrhea, change in bowel habits, loss of appetite, bloody stools.   Resp:  No chest wall deformity  Skin: no rash or lesions.  GU: no dysuria, change in color of urine, no urgency or frequency.  No flank pain, no hematuria   MS:  No joint pain or swelling.  No decreased range of motion.  No back pain.    Physical Exam  BP 90/60 (BP Location: Left Arm, Patient Position: Sitting, Cuff Size: Normal) Comment: provider made aware  Pulse 72   Temp 98.2 F (36.8 C) (Oral)   Ht 5\' 2"  (1.575 m)   Wt 188 lb (85.3 kg)   SpO2 94%   BMI 34.39 kg/m   GEN: A/Ox3; pleasant , NAD, well nourished    HEENT:  /AT,   NOSE-clear, THROAT-clear, no lesions, no postnasal drip or exudate noted.   NECK:  Supple w/ fair ROM; no JVD; normal carotid impulses w/o bruits; no thyromegaly or nodules palpated; no lymphadenopathy.    RESP  Clear  P & A; w/o, wheezes/ rales/ or rhonchi. no accessory muscle use, no dullness to percussion  CARD:  RRR, no m/r/g, no peripheral edema, pulses intact, no cyanosis or clubbing.  GI:   Soft & nt; nml bowel sounds; no organomegaly or masses detected.   Musco: Warm bil, no deformities or joint swelling noted.   Neuro: alert, no focal deficits noted.    Skin: Warm, no lesions or rashes    Lab Results:  CBC    ProBNP  Imaging: No results found.       Latest Ref Rng & Units 01/20/2018    1:58 PM  PFT Results  FVC-Pre L 1.09   FVC-Predicted Pre % 41   Pre FEV1/FVC % % 83   FEV1-Pre L 0.90   FEV1-Predicted Pre % 45   DLCO uncorrected ml/min/mmHg 14.09   DLCO UNC% % 69   DLCO corrected ml/min/mmHg 14.18   DLCO COR %Predicted % 70   DLVA Predicted % 125   TLC L 3.59   TLC % Predicted % 78   RV % Predicted % 84     No results found for: "NITRICOXIDE"      Assessment & Plan:   No problem-specific Assessment & Plan notes found for this encounter.     Rexene Edison, NP 08/14/2022

## 2022-08-15 ENCOUNTER — Ambulatory Visit (INDEPENDENT_AMBULATORY_CARE_PROVIDER_SITE_OTHER): Payer: Medicare Other | Admitting: Psychology

## 2022-08-15 ENCOUNTER — Ambulatory Visit: Payer: Medicare Other | Admitting: Dietician

## 2022-08-15 DIAGNOSIS — F411 Generalized anxiety disorder: Secondary | ICD-10-CM | POA: Diagnosis not present

## 2022-08-15 DIAGNOSIS — I999 Unspecified disorder of circulatory system: Secondary | ICD-10-CM

## 2022-08-15 DIAGNOSIS — I679 Cerebrovascular disease, unspecified: Secondary | ICD-10-CM

## 2022-08-15 DIAGNOSIS — F0670 Mild neurocognitive disorder due to known physiological condition without behavioral disturbance: Secondary | ICD-10-CM | POA: Diagnosis not present

## 2022-08-15 DIAGNOSIS — M797 Fibromyalgia: Secondary | ICD-10-CM

## 2022-08-15 DIAGNOSIS — F330 Major depressive disorder, recurrent, mild: Secondary | ICD-10-CM | POA: Diagnosis not present

## 2022-08-15 NOTE — Assessment & Plan Note (Signed)
History of severe sleep apnea.  Patient has ongoing symptoms with snoring, daytime sleepiness and restless sleep.  Will set patient up for repeat sleep study and pending those results decide if CPAP is indicated to restart  Plan  Patient Instructions  Set up for home sleep study.  Do not drive sleep  Healthy sleep regimen   Continue on Oxygen 2l/m with activity and At bedtime    Albuterol inhaler As needed    Follow up in 4-6 weeks with PFT and As needed

## 2022-08-15 NOTE — Assessment & Plan Note (Signed)
Continue on current regimen .   

## 2022-08-15 NOTE — Assessment & Plan Note (Signed)
Continue on O2 with activity and At bedtime  - goal is to keep sats >88-90%

## 2022-08-15 NOTE — Assessment & Plan Note (Signed)
Mld intermittent asthma - albuterol As needed   Check PFTs.   Plan  Patient Instructions  Set up for home sleep study.  Do not drive sleep  Healthy sleep regimen   Continue on Oxygen 2l/m with activity and At bedtime    Albuterol inhaler As needed    Follow up in 4-6 weeks with PFT and As needed

## 2022-08-15 NOTE — Progress Notes (Signed)
   Neuropsychology Feedback Session Amber Stephenson. [facility]  Reason for Referral:   Amber Stephenson is a 77 y.o. right-handed Caucasian female referred by [name], PA-C, to characterize her current cognitive functioning and assist with diagnostic clarity and treatment planning in the context of subjective cognitive decline and a previously diagnosed mild neurocognitive disorder.   Feedback:   Amber Stephenson completed a comprehensive neuropsychological evaluation on 08/08/2022. Please refer to that encounter for the full report and recommendations. Scores across stand-alone and embedded performance validity measures were variable. As such, while there remains the potential that current scores reflect true impairment and progressive decline (see below), these also may reflect Amber Stephenson occasionally zoning out while attempting cognitive tasks, coupled with significant psychiatric distress, acute on chronic pain, and reported sleep dysfunction. Briefly, results suggested diffuse impairment impacting nearly all assessed cognitive domains. Performances were appropriate across confrontation naming and visuospatial abilities, while some variability was seen across recognition/consolidation aspects of memory.   Amber Stephenson was accompanied by her husband during the current feedback session. Content of the current session focused on the results of her neuropsychological evaluation. Amber Stephenson was given the opportunity to ask questions and her questions were answered. She was encouraged to reach out should additional questions arise. A copy of her report was provided at the conclusion of the visit.      Greater than 31 minutes were spent preparing for, conducting, and documenting the current feedback session with Amber Stephenson, billed as one unit [phone].

## 2022-08-16 ENCOUNTER — Encounter (HOSPITAL_BASED_OUTPATIENT_CLINIC_OR_DEPARTMENT_OTHER): Payer: Self-pay

## 2022-08-16 ENCOUNTER — Emergency Department (HOSPITAL_BASED_OUTPATIENT_CLINIC_OR_DEPARTMENT_OTHER)
Admission: EM | Admit: 2022-08-16 | Discharge: 2022-08-16 | Disposition: A | Discharge status: Home / Self Care | Payer: Medicare Other | Attending: Emergency Medicine | Admitting: Emergency Medicine

## 2022-08-16 ENCOUNTER — Other Ambulatory Visit: Payer: Self-pay

## 2022-08-16 ENCOUNTER — Emergency Department (HOSPITAL_BASED_OUTPATIENT_CLINIC_OR_DEPARTMENT_OTHER): Payer: Medicare Other

## 2022-08-16 DIAGNOSIS — R7989 Other specified abnormal findings of blood chemistry: Secondary | ICD-10-CM | POA: Insufficient documentation

## 2022-08-16 DIAGNOSIS — Z7984 Long term (current) use of oral hypoglycemic drugs: Secondary | ICD-10-CM | POA: Diagnosis not present

## 2022-08-16 DIAGNOSIS — Z7901 Long term (current) use of anticoagulants: Secondary | ICD-10-CM | POA: Insufficient documentation

## 2022-08-16 DIAGNOSIS — I509 Heart failure, unspecified: Secondary | ICD-10-CM | POA: Insufficient documentation

## 2022-08-16 DIAGNOSIS — R4182 Altered mental status, unspecified: Secondary | ICD-10-CM | POA: Insufficient documentation

## 2022-08-16 DIAGNOSIS — R252 Cramp and spasm: Secondary | ICD-10-CM

## 2022-08-16 DIAGNOSIS — Z91040 Latex allergy status: Secondary | ICD-10-CM | POA: Insufficient documentation

## 2022-08-16 DIAGNOSIS — M62838 Other muscle spasm: Secondary | ICD-10-CM | POA: Insufficient documentation

## 2022-08-16 DIAGNOSIS — Z794 Long term (current) use of insulin: Secondary | ICD-10-CM | POA: Diagnosis not present

## 2022-08-16 DIAGNOSIS — R531 Weakness: Secondary | ICD-10-CM | POA: Diagnosis not present

## 2022-08-16 DIAGNOSIS — R29818 Other symptoms and signs involving the nervous system: Secondary | ICD-10-CM | POA: Diagnosis present

## 2022-08-16 DIAGNOSIS — E119 Type 2 diabetes mellitus without complications: Secondary | ICD-10-CM | POA: Diagnosis not present

## 2022-08-16 LAB — CBC WITH DIFFERENTIAL/PLATELET
Abs Immature Granulocytes: 0.05 10*3/uL (ref 0.00–0.07)
Basophils Absolute: 0 10*3/uL (ref 0.0–0.1)
Basophils Relative: 0 %
Eosinophils Absolute: 0.2 10*3/uL (ref 0.0–0.5)
Eosinophils Relative: 2 %
HCT: 41 % (ref 36.0–46.0)
Hemoglobin: 13.6 g/dL (ref 12.0–15.0)
Immature Granulocytes: 1 %
Lymphocytes Relative: 33 %
Lymphs Abs: 3.6 10*3/uL (ref 0.7–4.0)
MCH: 29.6 pg (ref 26.0–34.0)
MCHC: 33.2 g/dL (ref 30.0–36.0)
MCV: 89.1 fL (ref 80.0–100.0)
Monocytes Absolute: 0.8 10*3/uL (ref 0.1–1.0)
Monocytes Relative: 8 %
Neutro Abs: 6.1 10*3/uL (ref 1.7–7.7)
Neutrophils Relative %: 56 %
Platelets: 226 10*3/uL (ref 150–400)
RBC: 4.6 MIL/uL (ref 3.87–5.11)
RDW: 13.7 % (ref 11.5–15.5)
WBC: 10.8 10*3/uL — ABNORMAL HIGH (ref 4.0–10.5)
nRBC: 0 % (ref 0.0–0.2)

## 2022-08-16 LAB — BASIC METABOLIC PANEL
Anion gap: 10 (ref 5–15)
BUN: 31 mg/dL — ABNORMAL HIGH (ref 8–23)
CO2: 27 mmol/L (ref 22–32)
Calcium: 9.1 mg/dL (ref 8.9–10.3)
Chloride: 99 mmol/L (ref 98–111)
Creatinine, Ser: 1.28 mg/dL — ABNORMAL HIGH (ref 0.44–1.00)
GFR, Estimated: 44 mL/min — ABNORMAL LOW (ref >60)
Glucose, Bld: 105 mg/dL — ABNORMAL HIGH (ref 70–99)
Potassium: 4.1 mmol/L (ref 3.5–5.1)
Sodium: 136 mmol/L (ref 135–145)

## 2022-08-16 LAB — HEPATIC FUNCTION PANEL
ALT: 14 U/L (ref 0–44)
AST: 23 U/L (ref 15–41)
Albumin: 3.9 g/dL (ref 3.5–5.0)
Alkaline Phosphatase: 55 U/L (ref 38–126)
Bilirubin, Direct: 0.1 mg/dL (ref 0.0–0.2)
Indirect Bilirubin: 0.3 mg/dL (ref 0.3–0.9)
Total Bilirubin: 0.4 mg/dL (ref 0.3–1.2)
Total Protein: 6.9 g/dL (ref 6.5–8.1)

## 2022-08-16 LAB — AMMONIA: Ammonia: 22 umol/L (ref 9–35)

## 2022-08-16 LAB — MAGNESIUM: Magnesium: 2.3 mg/dL (ref 1.7–2.4)

## 2022-08-16 LAB — CBG MONITORING, ED: Glucose-Capillary: 110 mg/dL — ABNORMAL HIGH (ref 70–99)

## 2022-08-16 LAB — ETHANOL: Alcohol, Ethyl (B): <10 mg/dL (ref <10)

## 2022-08-16 MED ORDER — SODIUM CHLORIDE 0.9 % IV BOLUS
1000.0000 mL | Freq: Once | INTRAVENOUS | Status: AC
  Administered 2022-08-16: 1000 mL via INTRAVENOUS

## 2022-08-16 NOTE — ED Notes (Signed)
Patient returned from CT

## 2022-08-16 NOTE — ED Notes (Signed)
Patient taken to CT.

## 2022-08-16 NOTE — ED Notes (Signed)
Dr. Ronnald Nian states that he does not need the urine drug screen.

## 2022-08-16 NOTE — ED Provider Notes (Signed)
[facility] Provider Note   CSN: [id number] Arrival date & time: 08/16/22  1900     History  Chief Complaint  Patient presents with   Neurologic Problem    Amber Stephenson is a 77 y.o. female.  Patient is here with some involuntary twitching in her arms and legs.  May be some speech issues.  Been on and off all day today.  Denies any history of seizures.  Denies any weakness or numbness or vision loss.  No new medications.  She is on some chronic pain medicine pregabalin Cogentin antidepressants.  She denies any chest pain or shortness of breath or abdominal pain.  Has had no loss of consciousness.  No seizure history.  Denies history of stroke.  Has had some cognitive issues per family.  Denies any alcohol or drugs.  The history is provided by the patient.       Home Medications Prior to Admission medications   Medication Sig Start Date End Date Taking? Authorizing Provider  Acetaminophen (TYLENOL PO) Take 2 tablets by mouth as needed (pain/headache).    [provider]  albuterol (PROVENTIL) (2.5 MG/3ML) 0.083% nebulizer solution Take 3 mLs (2.5 mg total) by nebulization every 6 (six) hours as needed for wheezing or shortness of breath. Patient taking differently: Take 2.5 mg by nebulization as needed for wheezing or shortness of breath. 08/09/20   Gerlene Fee, NP  albuterol (VENTOLIN HFA) 108 (90 Base) MCG/ACT inhaler Inhale 2 puffs into the lungs every 6 (six) hours as needed for wheezing or shortness of breath. Patient taking differently: Inhale 2 puffs into the lungs as needed for wheezing or shortness of breath. 08/09/20   Gerlene Fee, NP  apixaban (ELIQUIS) 5 MG TABS tablet TAKE 1 TABLET TWICE A DAY 07/29/22   [name], MD  ARIPiprazole (ABILIFY) 2 MG tablet Take 2 mg by mouth at bedtime. 10/07/21   [provider]  atorvastatin (LIPITOR) 40 MG tablet TAKE 1 TABLET(40 MG) BY MOUTH DAILY (NEEDS TO BE  SEEN BEFORE NEXT REFILL) 07/14/22   [name], MD  benztropine (COGENTIN) 1 MG tablet Take 1 tablet (1 mg total) by mouth 2 (two) times daily. 12/11/21   [name], MD  colchicine 0.6 MG tablet Take 1 tablet (0.6 mg total) by mouth 2 (two) times daily as needed. 07/17/22   Dettinger, Fransisca Kaufmann, MD  Continuous Blood Gluc Sensor (DEXCOM G7 SENSOR) MISC Change sensor every 10 days 05/20/22   Shamleffer, Melanie Crazier, MD  Continuous Blood Gluc Transmit (DEXCOM G6 TRANSMITTER) MISC Use as instructed to check blood sugar. Change every 90 days 05/12/22   Shamleffer, Melanie Crazier, MD  denosumab (PROLIA) 60 MG/ML SOSY injection Inject 60 mg into the skin every 6 (six) months. 08/14/20   [name], MD  escitalopram (LEXAPRO) 10 MG tablet Take 10 mg by mouth daily. 10/13/21   [provider]  fluconazole (DIFLUCAN) 150 MG tablet Take 1 now and can repeat 1 in 3 days 06/19/22   Estill Dooms, NP  Fluticasone-Salmeterol (ADVAIR DISKUS) 500-50 MCG/DOSE AEPB Inhale 1 puff into the lungs in the morning and at bedtime. Patient taking differently: Inhale 1 puff into the lungs as needed (SOB). 08/09/20   Gerlene Fee, NP  furosemide (LASIX) 40 MG tablet Take 0.5 tablets (20 mg total) by mouth 2 (two) times daily. Take extra 20mg  in am if dyspnea, weight gain of 3 lbs or greater, or edema. 06/29/22 07/29/22  Manuella Ghazi, Pratik D, DO  Galcanezumab-gnlm [facility]) 120 MG/ML SOAJ INJECT CONTENTS OF 1 PEN INTO THE SKIN ONCE MONTHLY 08/11/22   [name] E, PA-C  glucose blood (FREESTYLE LITE) test strip Check blood sugar 2-3 weekly 08/04/22   Shamleffer, Melanie Crazier, MD  hydrOXYzine (VISTARIL) 25 MG capsule Take 1 capsule (25 mg total) by mouth every 8 (eight) hours as needed. 03/31/22   Hawks, Theador Hawthorne, FNP  icosapent Ethyl (VASCEPA) 1 g capsule TAKE 2 CAPSULES TWICE A DAY WITH MEALS Patient taking differently: Take 2 g by mouth 2 (two) times daily. 05/28/22   [name], MD  insulin  glargine (LANTUS SOLOSTAR) 100 UNIT/ML Solostar Pen Inject 68 Units into the skin daily. Patient taking differently: Inject 34 Units into the skin 2 (two) times daily. 06/09/22   Shamleffer, Melanie Crazier, MD  insulin lispro (HUMALOG KWIKPEN) 100 UNIT/ML KwikPen Max daily 50 units Patient taking differently: Inject 4-8 Units into the skin 3 (three) times daily. 05/28/22   Shamleffer, Melanie Crazier, MD  Insulin Pen Needle 29G X 12MM MISC 1 Device by Does not apply route daily in the afternoon. 05/28/22   Shamleffer, Melanie Crazier, MD  lisinopril (ZESTRIL) 10 MG tablet Take 1 tablet (10 mg total) by mouth daily. 01/14/22   [name], MD  LORazepam (ATIVAN) 1 MG tablet Take 1 tablet (1 mg total) by mouth 2 (two) times daily. And give 2 tablets by mouth at bedtime Patient taking differently: Take 1-2 mg by mouth 2 (two) times daily. Take 2 mg by mouth in the morning and 1 mg by mouth at bedtime 08/09/20   Gerlene Fee, NP  metFORMIN (GLUCOPHAGE) 500 MG tablet Take 1 tablet (500 mg total) by mouth 2 (two) times daily with a meal. TAKE 1 TABLET(500 MG) BY MOUTH TWICE DAILY AFTER A MEAL Patient taking differently: Take 500 mg by mouth 2 (two) times daily with a meal. 05/28/22   Shamleffer, Melanie Crazier, MD  metoprolol succinate (TOPROL XL) 25 MG 24 hr tablet Take 1 tablet (25 mg total) by mouth in the morning and at bedtime. Patient taking differently: Take 25 mg by mouth in the morning and at bedtime. Take with 100 mg tablet twice daily 11/13/21   Sueanne Margarita, MD  metoprolol succinate (TOPROL-XL) 100 MG 24 hr tablet TAKE 1 TABLET IN THE MORNING AND AT BEDTIME Patient taking differently: Take 100 mg by mouth in the morning and at bedtime. Take with 25 mg tablet twice daily. 11/13/21   Sueanne Margarita, MD  morphine (MS CONTIN) 15 MG 12 hr tablet Take 1 tablet (15 mg total) by mouth every 12 (twelve) hours. This is a long acting form, so take 1 tab 2x/day to keep pain more even- it is NOT a  short acting pain med like you were taking- for chronic pain 07/21/22   Jennye Boroughs, MD  naloxone [facility]) nasal spray 4 mg/0.1 mL Place 1 spray into the nose once. 08/08/21   [provider]  nystatin (MYCOSTATIN/NYSTOP) powder Apply 1 Application topically as needed (rash). 06/16/22   Shamleffer, Melanie Crazier, MD  nystatin ointment (MYCOSTATIN) Apply 1 Application topically 2 (two) times daily. Patient taking differently: Apply 1 Application topically as needed (rash). 12/27/21   Estill Dooms, NP  ondansetron (ZOFRAN-ODT) 8 MG disintegrating tablet Take 1 tablet (8 mg total) by mouth every 6 (six) hours as needed for nausea or vomiting. 01/13/22   [name], MD  OXcarbazepine (TRILEPTAL) 150 MG tablet Take 1  tablet (150 mg total) by mouth 2 (two) times daily. 06/13/22   Raiford Noble Latif, DO  potassium chloride SA (KLOR-CON M) 20 MEQ tablet Take 1 tablet (20 mEq total) by mouth daily. Patient taking differently: Take 10 mEq by mouth at bedtime. 01/27/22   [name], MD  pregabalin (LYRICA) 200 MG capsule TAKE 1 CAPSULE(200 MG) BY MOUTH TWICE DAILY Patient taking differently: Take 200 mg by mouth 2 (two) times daily. 03/27/22   Lovorn, Jinny Blossom, MD  senna-docusate (SENOKOT-S) 8.6-50 MG tablet Take 1 tablet by mouth at bedtime. 06/13/22   Sheikh, Omair Latif, DO  tirzepatide [facility]) 5 MG/0.5ML Pen Inject 5 mg into the skin once a week.    [provider]  Vitamin D, Cholecalciferol, 25 MCG (1000 UT) TABS Take 1,000 Units by mouth daily in the afternoon. 07/26/20   [provider]      Allergies    Iodine, Ivp dye [iodinated contrast media], Latex, and Tizanidine    Review of Systems   Review of Systems  Physical Exam Updated Vital Signs BP 130/80   Pulse 73   Temp 97.6 F (36.4 C)   Resp (!) 21   Ht 5\' 2"  (1.575 m)   Wt 85.2 kg   SpO2 92%   BMI 34.35 kg/m  Physical Exam Vitals and nursing note reviewed.  Constitutional:      General:  She is not in acute distress.    Appearance: She is well-developed. She is not ill-appearing.  HENT:     Head: Normocephalic and atraumatic.     Nose: Nose normal.     Mouth/Throat:     Mouth: Mucous membranes are moist.  Eyes:     Extraocular Movements: Extraocular movements intact.     Conjunctiva/sclera: Conjunctivae normal.     Pupils: Pupils are equal, round, and reactive to light.  Cardiovascular:     Rate and Rhythm: Normal rate and regular rhythm.     Pulses: Normal pulses.     Heart sounds: Normal heart sounds. No murmur heard. Pulmonary:     Effort: Pulmonary effort is normal. No respiratory distress.     Breath sounds: Normal breath sounds.  Abdominal:     Palpations: Abdomen is soft.     Tenderness: There is no abdominal tenderness.  Musculoskeletal:        General: No swelling.     Cervical back: Normal range of motion and neck supple.  Skin:    General: Skin is warm and dry.     Capillary Refill: Capillary refill takes less than 2 seconds.  Neurological:     General: No focal deficit present.     Mental Status: She is alert and oriented to person, place, and time.     Cranial Nerves: No cranial nerve deficit.     Sensory: No sensory deficit.     Motor: No weakness.     Coordination: Coordination normal.     Gait: Gait normal.     Comments: There is no clonus, 5+ out of 5 strength throughout, normal speech, normal visual fields, normal finger-nose-finger, normal sensation, there is some twitching of the right foot at times and of the left arm, there is no flapping of the hands  Psychiatric:        Mood and Affect: Mood normal.     ED Results / Procedures / Treatments   Labs (all labs ordered are listed, but only abnormal results are displayed) Labs Reviewed  CBC WITH DIFFERENTIAL/PLATELET - Abnormal;  Notable for the following components:      Result Value   WBC 10.8 (*)    All other components within normal limits  BASIC METABOLIC PANEL - Abnormal;  Notable for the following components:   Glucose, Bld 105 (*)    BUN 31 (*)    Creatinine, Ser 1.28 (*)    GFR, Estimated 44 (*)    All other components within normal limits  CBG MONITORING, ED - Abnormal; Notable for the following components:   Glucose-Capillary 110 (*)    All other components within normal limits  AMMONIA  HEPATIC FUNCTION PANEL  ETHANOL  MAGNESIUM  10-HYDROXYCARBAZEPINE    EKG EKG Interpretation  Date/Time:  Saturday August 16 2022 19:14:11 EDT Ventricular Rate:  82 PR Interval:    QRS Duration: 81 QT Interval:  371 QTC Calculation: 434 R Axis:   -43 Text Interpretation: Atrial fibrillation Confirmed by Lennice Sites ([id number]) on 08/16/2022 7:31:51 PM  Radiology CT Head Wo Contrast  Result Date: 08/16/2022 CLINICAL DATA:  Involuntary movements with left leg weakness. EXAM: CT HEAD WITHOUT CONTRAST TECHNIQUE: Contiguous axial images were obtained from the base of the skull through the vertex without intravenous contrast. RADIATION DOSE REDUCTION: This exam was performed according to the departmental dose-optimization program which includes automated exposure control, adjustment of the mA and/or kV according to patient size and/or use of iterative reconstruction technique. COMPARISON:  November 21, 2021 FINDINGS: Brain: There is mild cerebral atrophy with widening of the extra-axial spaces and ventricular dilatation. There are areas of decreased attenuation within the white matter tracts of the supratentorial brain, consistent with microvascular disease changes. Vascular: No hyperdense vessel or unexpected calcification. Skull: Normal. Negative for fracture or focal lesion. Sinuses/Orbits: An 11 mm x 8 mm right maxillary sinus polyp versus mucous retention cyst is seen. Other: None. IMPRESSION: 1. No acute intracranial abnormality. 2. Generalized cerebral atrophy and microvascular disease changes of the supratentorial brain. Electronically Signed   By: Virgina Norfolk M.D.    On: 08/16/2022 19:46    Procedures Procedures    Medications Ordered in ED Medications  sodium chloride 0.9 % bolus 1,000 mL (1,000 mLs Intravenous New Bag/Given 08/16/22 2027)    ED Course/ Medical Decision Making/ A&P                             Medical Decision Making Amount and/or Complexity of Data Reviewed Labs: ordered. Radiology: ordered.   Amber Stephenson is here with some involuntary movements of her hands and legs.  Patient has history of heart failure, restless legs, A-fib on Eliquis, chronic pain on chronic narcotics, diabetes, some neurocognitive issues, anxiety disorder.  Overall her neuroexam is normal except for some twitching in her legs and hands that I see mostly in the right foot and right hand.  No clonus.  Normal strength and sensation.  Exams not consistent with stroke.  Does not appear to be consistent with seizure.  This seems to be metabolic in nature.  May be related to one of her medications.  Does not appear to have any confusion or fever and have no concern for serotonin syndrome.  She is overall well-appearing.  Will get labs including CBC, BMP, ammonia, ethanol, Trileptal level, CT scan of head.  EKG shows atrial fibrillation that is rate controlled.  Differential diagnosis is that this is likely metabolic issue or medication side effect.  Seems less likely to be stroke or seizure related.  Will talk with neurology.  Overall patient with mild elevation in her creatinine at 1.3 but otherwise lab works unremarkable.  Electrolytes are unremarkable.  Head CT is normal with no acute findings per radiology report.  Talked with Dr. Leonel Ramsay and ultimately symptoms are not consistent with stroke or seizures.  He suggest that this is likely from medications Lyrica likely being the most likely culprit.  Will have her reduce this dose.  She is given IV fluids.  Will decrease her Lyrica to 100 mg 3 times a day and have her follow-up with her neurologist and primary  care doctor.  Patient discharged in good condition.  This chart was dictated using voice recognition software.  Despite best efforts to proofread,  errors can occur which can change the documentation meaning.         Final Clinical Impression(s) / ED Diagnoses Final diagnoses:  Spasms of the hands or feet    Rx / DC Orders ED Discharge Orders     None         Lennice Sites, DO 08/16/22 2040

## 2022-08-16 NOTE — ED Triage Notes (Signed)
POV from home, BIB wheelchair, A&O x 4, GCS 15  Pt c/o "jerky" movements that are involuntary, pt sts left leg weakness, trouble grasping objects with bilateral hands, difficulty finding words but denies slurred speech. LKW upon waking @ 0800. BGL 110.  Takes elliquis for a fib and took last dose this am.

## 2022-08-16 NOTE — Discharge Instructions (Signed)
Decrease your Lyrica/pregabalin to 100 mg 3 times a day or 150 mg twice a day.  Follow-up with your primary care doctor and your neurologist.

## 2022-08-17 DIAGNOSIS — G43109 Migraine with aura, not intractable, without status migrainosus: Secondary | ICD-10-CM | POA: Diagnosis not present

## 2022-08-17 DIAGNOSIS — I482 Chronic atrial fibrillation, unspecified: Secondary | ICD-10-CM | POA: Diagnosis not present

## 2022-08-17 DIAGNOSIS — G4700 Insomnia, unspecified: Secondary | ICD-10-CM | POA: Diagnosis not present

## 2022-08-17 DIAGNOSIS — E1140 Type 2 diabetes mellitus with diabetic neuropathy, unspecified: Secondary | ICD-10-CM | POA: Diagnosis not present

## 2022-08-17 DIAGNOSIS — I5033 Acute on chronic diastolic (congestive) heart failure: Secondary | ICD-10-CM | POA: Diagnosis not present

## 2022-08-17 DIAGNOSIS — K760 Fatty (change of) liver, not elsewhere classified: Secondary | ICD-10-CM | POA: Diagnosis not present

## 2022-08-17 DIAGNOSIS — I2510 Atherosclerotic heart disease of native coronary artery without angina pectoris: Secondary | ICD-10-CM | POA: Diagnosis not present

## 2022-08-17 DIAGNOSIS — E1121 Type 2 diabetes mellitus with diabetic nephropathy: Secondary | ICD-10-CM | POA: Diagnosis not present

## 2022-08-17 DIAGNOSIS — I110 Hypertensive heart disease with heart failure: Secondary | ICD-10-CM | POA: Diagnosis not present

## 2022-08-17 DIAGNOSIS — I272 Pulmonary hypertension, unspecified: Secondary | ICD-10-CM | POA: Diagnosis not present

## 2022-08-17 DIAGNOSIS — G8929 Other chronic pain: Secondary | ICD-10-CM | POA: Diagnosis not present

## 2022-08-17 DIAGNOSIS — F419 Anxiety disorder, unspecified: Secondary | ICD-10-CM | POA: Diagnosis not present

## 2022-08-17 DIAGNOSIS — D179 Benign lipomatous neoplasm, unspecified: Secondary | ICD-10-CM | POA: Diagnosis not present

## 2022-08-17 DIAGNOSIS — J4520 Mild intermittent asthma, uncomplicated: Secondary | ICD-10-CM | POA: Diagnosis not present

## 2022-08-17 DIAGNOSIS — J9621 Acute and chronic respiratory failure with hypoxia: Secondary | ICD-10-CM | POA: Diagnosis not present

## 2022-08-17 DIAGNOSIS — Z6835 Body mass index (BMI) 35.0-35.9, adult: Secondary | ICD-10-CM | POA: Diagnosis not present

## 2022-08-17 DIAGNOSIS — M545 Low back pain, unspecified: Secondary | ICD-10-CM | POA: Diagnosis not present

## 2022-08-17 DIAGNOSIS — F319 Bipolar disorder, unspecified: Secondary | ICD-10-CM | POA: Diagnosis not present

## 2022-08-17 DIAGNOSIS — G2581 Restless legs syndrome: Secondary | ICD-10-CM | POA: Diagnosis not present

## 2022-08-17 DIAGNOSIS — G4733 Obstructive sleep apnea (adult) (pediatric): Secondary | ICD-10-CM | POA: Diagnosis not present

## 2022-08-17 DIAGNOSIS — S91105D Unspecified open wound of left lesser toe(s) without damage to nail, subsequent encounter: Secondary | ICD-10-CM | POA: Diagnosis not present

## 2022-08-17 DIAGNOSIS — E7800 Pure hypercholesterolemia, unspecified: Secondary | ICD-10-CM | POA: Diagnosis not present

## 2022-08-17 DIAGNOSIS — E669 Obesity, unspecified: Secondary | ICD-10-CM | POA: Diagnosis not present

## 2022-08-17 DIAGNOSIS — K7581 Nonalcoholic steatohepatitis (NASH): Secondary | ICD-10-CM | POA: Diagnosis not present

## 2022-08-17 DIAGNOSIS — G3184 Mild cognitive impairment, so stated: Secondary | ICD-10-CM | POA: Diagnosis not present

## 2022-08-18 ENCOUNTER — Ambulatory Visit (INDEPENDENT_AMBULATORY_CARE_PROVIDER_SITE_OTHER): Payer: Medicare Other | Admitting: Family Medicine

## 2022-08-18 ENCOUNTER — Ambulatory Visit: Payer: Medicare Other | Admitting: Family Medicine

## 2022-08-18 ENCOUNTER — Encounter: Payer: Self-pay | Admitting: Family Medicine

## 2022-08-18 VITALS — BP 109/73 | HR 68 | Temp 97.3°F | Ht 62.0 in | Wt 195.0 lb

## 2022-08-18 DIAGNOSIS — G250 Essential tremor: Secondary | ICD-10-CM

## 2022-08-18 NOTE — Progress Notes (Unsigned)
Awoke Saturday jerking, unable to form words.   Subjective:  Patient ID: Amber Stephenson, female    DOB: [birth year]  Age: 77 y.o. MRN: [id number]  CC: ER FOLLOW UP ([facility] 3/16 SPASMS OF HANDS AND FEET/)   HPI Amber Stephenson presents for onset 2 days ago of jerking movements and couldn't form words. Jerking mostly bilateral arms and legs, but nonfocal. Now a lot better.      08/18/2022    3:18 PM 07/17/2022   12:28 PM 07/17/2022   12:27 PM  Depression screen PHQ 2/9  Decreased Interest 0  0  Down, Depressed, Hopeless 0  0  PHQ - 2 Score 0  0  Altered sleeping  0   Tired, decreased energy  0   Change in appetite  0   Feeling bad or failure about yourself   0   Trouble concentrating  0   Moving slowly or fidgety/restless  0   Suicidal thoughts  0   Difficult doing work/chores  Not difficult at all     History Amber Stephenson has a past medical history of Acquired hammer toe (12/06/2020), Acute exacerbation of CHF (congestive heart failure) (Q000111Q), Acute metabolic encephalopathy (99991111), Acute respiratory failure with hypoxia (12/16/2020), AKI (acute kidney injury) (06/27/2022), Allergic rhinitis (02/24/2019), Ambulatory dysfunction (04/23/2020), Atrial fibrillation with RVR (05/19/2017), Back pain/sacroiliitis--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), Benign paroxysmal positional vertigo due to bilateral vestibular disorder (05/20/2019), Benzodiazepine dependence (12/06/2020), Bipolar 1 disorder (01/23/2015), CAD (coronary artery disease), Cardiomegaly (01/12/2018), CHF (congestive heart failure) (06/08/2022), Chronic anticoagulation (05/30/2015), Chronic back pain, Chronic constipation (04/25/2020), Chronic diastolic heart failure (123456), Chronic pain syndrome (08/22/2019), Chronic post-traumatic stress disorder (PTSD) (12/06/2020), Chronic respiratory failure with hypoxia (04/25/2020), Chronic right-sided low back pain with  right-sided sciatica (07/19/2021), Cystitis (12/06/2020), Debility (12/06/2020), Diabetic neuropathy (02/06/2016), Dyslipidemia (09/24/2020), Dyspnea on exertion, Dysuria (04/16/2021), Elevated brain natriuretic peptide (BNP) level (12/14/2020), Elevated troponin (12/14/2020), Essential hypertension, Essential hypertriglyceridemia (12/06/2020), Fibromyalgia (03/19/2017), Functional diarrhea (10/26/2020), Generalized anxiety disorder (12/06/2020), Head trauma (09/17/2020), Hemorrhoids (03/11/2022), Herpes genitalis in women (07/16/2015), History of adenomatous polyp of colon (05/21/2019), Hypermetropia (12/06/2020), Hypocalcemia, Insomnia (01/23/2015), Lipoma (02/08/2015), Major depressive disorder (10/03/2015), Microalbuminuria (01/21/2022), Migraine headache with aura (02/12/2016), Mild intermittent asthma (01/12/2018), 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04/21/2019), Mixed hyperlipidemia (05/20/2019), Morbid obesity with alveolar hypoventilation (07/06/2018), Myofascial pain dysfunction syndrome (A999333), Non-alcoholic fatty liver disease (01/12/2018), Non-neoplastic nevus (12/06/2020), Opioid dependence (12/06/2020), OSA (obstructive sleep apnea) (02/24/2019), 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), Osteopenia (12/06/2020), Pain in joint, shoulder region (12/06/2020), 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   Amber Stephenson has  a past surgical history that includes THIGH SURGERY; Shoulder surgery (Right); Breast reduction surgery; Eye surgery (Right); Hammer toe surgery; LEFT HEART CATH AND CORONARY ANGIOGRAPHY (N/A, 02/03/2018); and Reverse shoulder arthroplasty (Right, 08/19/2019).   Her family history includes Alcohol abuse in her sister; Alzheimer's disease in her father; Diabetes in her brother, mother, and sister; Heart disease in her brother, mother, and sister; Mental illness in her brother; Stroke in her brother.Amber Stephenson reports that Amber Stephenson has never smoked. Amber Stephenson has never used smokeless tobacco. Amber Stephenson reports that Amber Stephenson does  not currently use alcohol. Amber Stephenson reports that Amber Stephenson does not use drugs.    ROS Review of Systems  Objective:  BP 109/73   Pulse 68   Temp (!) 97.3 F (36.3 C)   Ht 5\' 2"  (1.575 m)   Wt 195 lb (88.5 kg)   SpO2 94%   BMI 35.67 kg/m   BP Readings from Last 3 Encounters:  08/18/22 109/73  08/16/22 122/79  08/14/22 90/60    Wt Readings from Last 3 Encounters:  08/18/22 195 lb (88.5 kg)  08/16/22 187 lb 13.3 oz (85.2 kg)  08/14/22 188 lb (85.3 kg)     Physical Exam    Assessment & Plan:   There are no diagnoses linked to this encounter.     Amber Stephenson maintain her Vitamin D (Cholecalciferol), albuterol, albuterol, Fluticasone-Salmeterol, LORazepam, Prolia, naloxone, escitalopram, ARIPiprazole, metoprolol succinate, metoprolol succinate, benztropine, nystatin ointment, ondansetron, lisinopril, potassium chloride SA, pregabalin, hydrOXYzine, Dexcom G6 Transmitter, Dexcom G7 Sensor, icosapent Ethyl, insulin lispro, metFORMIN, Insulin Pen Needle, Lantus SoloStar, Acetaminophen (TYLENOL PO), senna-docusate, OXcarbazepine, nystatin, Mounjaro, fluconazole, furosemide, atorvastatin, colchicine, morphine, Eliquis, FREESTYLE LITE, and Emgality.  Allergies as of 08/18/2022       Reactions   Iodine Anaphylaxis   Ivp Dye [iodinated Contrast Media] Anaphylaxis, Swelling   Throat  closes   Latex Other (See Comments)   Latex tape pulls skin with it   Tizanidine Other (See Comments)   Weakness, goofy, bad dreams        Medication List        Accurate as of August 18, 2022  3:56 PM. If you have any questions, ask your nurse or doctor.          albuterol (2.5 MG/3ML) 0.083% nebulizer solution Commonly known as: PROVENTIL Take 3 mLs (2.5 mg total) by nebulization every 6 (six) hours as needed for wheezing or shortness of breath. What changed: when to take this   albuterol 108 (90 Base) MCG/ACT inhaler Commonly known as: VENTOLIN HFA Inhale 2 puffs into the lungs every 6 (six) hours as needed for wheezing or shortness of breath. What changed: when to take this   ARIPiprazole 2 MG tablet Commonly known as: ABILIFY Take 2 mg by mouth at bedtime.   atorvastatin 40 MG tablet Commonly known as: LIPITOR TAKE 1 TABLET(40 MG) BY MOUTH DAILY (NEEDS TO BE SEEN BEFORE NEXT REFILL)   benztropine 1 MG tablet Commonly known as: COGENTIN Take 1 tablet (1 mg total) by mouth 2 (two) times daily.   colchicine 0.6 MG tablet Take 1 tablet (0.6 mg total) by mouth 2 (two) times daily as needed.   Dexcom G6 Transmitter Misc Use as instructed to check blood sugar. Change every 90 days   Dexcom G7 Sensor Misc Change sensor every 10 days   Eliquis 5 MG Tabs tablet Generic drug: apixaban TAKE 1 TABLET TWICE A DAY   Emgality 120 MG/ML Soaj Generic drug: Galcanezumab-gnlm INJECT CONTENTS OF 1 PEN INTO THE SKIN ONCE MONTHLY   escitalopram 10 MG tablet Commonly known as: LEXAPRO Take 10 mg by mouth daily.   fluconazole 150 MG tablet Commonly known as: DIFLUCAN Take 1 now and can repeat 1 in 3 days   Fluticasone-Salmeterol 500-50 MCG/DOSE Aepb Commonly known as: Advair Diskus Inhale 1 puff into the lungs in the morning and at bedtime. What changed:  when to take this reasons to take this   FREESTYLE LITE test strip Generic drug: glucose blood Check blood  sugar  2-3 weekly   furosemide 40 MG tablet Commonly known as: LASIX Take 0.5 tablets (20 mg total) by mouth 2 (two) times daily. Take extra 20mg  in am if dyspnea, weight gain of 3 lbs or greater, or edema.   hydrOXYzine 25 MG capsule Commonly known as: VISTARIL Take 1 capsule (25 mg total) by mouth every 8 (eight) hours as needed.   icosapent Ethyl 1 g capsule Commonly known as: VASCEPA TAKE 2 CAPSULES TWICE A DAY WITH MEALS What changed: See the new instructions.   insulin lispro 100 UNIT/ML KwikPen Commonly known as: HumaLOG KwikPen Max daily 50 units What changed:  how much to take how to take this when to take this additional instructions   Insulin Pen Needle 29G X 12MM Misc 1 Device by Does not apply route daily in the afternoon.   Lantus SoloStar 100 UNIT/ML Solostar Pen Generic drug: insulin glargine Inject 68 Units into the skin daily. What changed:  how much to take when to take this   lisinopril 10 MG tablet Commonly known as: ZESTRIL Take 1 tablet (10 mg total) by mouth daily.   LORazepam 1 MG tablet Commonly known as: ATIVAN Take 1 tablet (1 mg total) by mouth 2 (two) times daily. And give 2 tablets by mouth at bedtime What changed:  how much to take additional instructions   metFORMIN 500 MG tablet Commonly known as: GLUCOPHAGE Take 1 tablet (500 mg total) by mouth 2 (two) times daily with a meal. TAKE 1 TABLET(500 MG) BY MOUTH TWICE DAILY AFTER A MEAL What changed: additional instructions   metoprolol succinate 25 MG 24 hr tablet Commonly known as: Toprol XL Take 1 tablet (25 mg total) by mouth in the morning and at bedtime. What changed: additional instructions   metoprolol succinate 100 MG 24 hr tablet Commonly known as: TOPROL-XL TAKE 1 TABLET IN THE MORNING AND AT BEDTIME What changed:  how much to take how to take this when to take this additional instructions   morphine 15 MG 12 hr tablet Commonly known as: MS Contin Take 1 tablet  (15 mg total) by mouth every 12 (twelve) hours. This is a long acting form, so take 1 tab 2x/day to keep pain more even- it is NOT a short acting pain med like you were taking- for chronic pain   Mounjaro 5 MG/0.5ML Pen Generic drug: tirzepatide Inject 5 mg into the skin once a week.   naloxone 4 MG/0.1ML Liqd nasal spray kit Commonly known as: NARCAN Place 1 spray into the nose once.   nystatin ointment Commonly known as: MYCOSTATIN Apply 1 Application topically 2 (two) times daily. What changed:  when to take this reasons to take this   nystatin powder Commonly known as: MYCOSTATIN/NYSTOP Apply 1 Application topically as needed (rash). What changed: Another medication with the same name was changed. Make sure you understand how and when to take each.   ondansetron 8 MG disintegrating tablet Commonly known as: ZOFRAN-ODT Take 1 tablet (8 mg total) by mouth every 6 (six) hours as needed for nausea or vomiting.   OXcarbazepine 150 MG tablet Commonly known as: TRILEPTAL Take 1 tablet (150 mg total) by mouth 2 (two) times daily.   potassium chloride SA 20 MEQ tablet Commonly known as: KLOR-CON M Take 1 tablet (20 mEq total) by mouth daily. What changed:  how much to take when to take this   pregabalin 200 MG capsule Commonly known as: LYRICA TAKE 1 CAPSULE(200 MG) BY MOUTH TWICE DAILY  What changed: See the new instructions.   Prolia 60 MG/ML Sosy injection Generic drug: denosumab Inject 60 mg into the skin every 6 (six) months.   senna-docusate 8.6-50 MG tablet Commonly known as: Senokot-S Take 1 tablet by mouth at bedtime.   TYLENOL PO Take 2 tablets by mouth as needed (pain/headache).   Vitamin D (Cholecalciferol) 25 MCG (1000 UT) Tabs Take 1,000 Units by mouth daily in the afternoon.         Follow-up: No follow-ups on file.  [name], M.D.

## 2022-08-19 ENCOUNTER — Telehealth: Payer: Self-pay

## 2022-08-19 ENCOUNTER — Encounter: Payer: Self-pay | Admitting: Family Medicine

## 2022-08-19 ENCOUNTER — Other Ambulatory Visit: Payer: Self-pay

## 2022-08-19 ENCOUNTER — Ambulatory Visit: Payer: Self-pay | Admitting: *Deleted

## 2022-08-19 MED ORDER — MOUNJARO 5 MG/0.5ML ~~LOC~~ SOAJ: 5.0000 mg | SUBCUTANEOUS | 2 refills | Status: DC

## 2022-08-19 MED ORDER — MORPHINE SULFATE ER 15 MG PO TBCR: 15.0000 mg | EXTENDED_RELEASE_TABLET | Freq: Two times a day (BID) | ORAL | 0 refills | Status: DC

## 2022-08-19 NOTE — Telephone Encounter (Signed)
Patient requesting refill on morphine 15mg  last fill per pmp    07/22/2022 07/21/2022 2  Morphine Sulf Er 15 Mg Tablet 60.00 30 Yu Sht [id number] [facility] ([id number]) 0/0 30.00 MME Comm Ins 

## 2022-08-20 ENCOUNTER — Ambulatory Visit: Payer: Medicare Other | Admitting: Cardiology

## 2022-08-20 ENCOUNTER — Other Ambulatory Visit: Payer: Self-pay | Admitting: Family Medicine

## 2022-08-20 ENCOUNTER — Telehealth: Payer: Self-pay

## 2022-08-20 DIAGNOSIS — S91105D Unspecified open wound of left lesser toe(s) without damage to nail, subsequent encounter: Secondary | ICD-10-CM | POA: Diagnosis not present

## 2022-08-20 DIAGNOSIS — I4821 Permanent atrial fibrillation: Secondary | ICD-10-CM

## 2022-08-20 DIAGNOSIS — I5033 Acute on chronic diastolic (congestive) heart failure: Secondary | ICD-10-CM | POA: Diagnosis not present

## 2022-08-20 DIAGNOSIS — I5032 Chronic diastolic (congestive) heart failure: Secondary | ICD-10-CM

## 2022-08-20 DIAGNOSIS — E1140 Type 2 diabetes mellitus with diabetic neuropathy, unspecified: Secondary | ICD-10-CM | POA: Diagnosis not present

## 2022-08-20 DIAGNOSIS — I110 Hypertensive heart disease with heart failure: Secondary | ICD-10-CM | POA: Diagnosis not present

## 2022-08-20 DIAGNOSIS — E782 Mixed hyperlipidemia: Secondary | ICD-10-CM

## 2022-08-20 DIAGNOSIS — J9621 Acute and chronic respiratory failure with hypoxia: Secondary | ICD-10-CM | POA: Diagnosis not present

## 2022-08-20 DIAGNOSIS — I482 Chronic atrial fibrillation, unspecified: Secondary | ICD-10-CM | POA: Diagnosis not present

## 2022-08-20 LAB — 10-HYDROXYCARBAZEPINE: Triliptal/MTB(Oxcarbazepin): 1 ug/mL — ABNORMAL LOW (ref 10–35)

## 2022-08-20 NOTE — Telephone Encounter (Signed)
     Patient  visit on 3/16  at [facility] you been able to follow up with your primary care physician? Yes   The patient was or was not able to obtain any needed medicine or equipment. Yes   Are there diet recommendations that you are having difficulty following? Na   Patient expresses understanding of discharge instructions and education provided has no other needs at this time.  Yes      [facility] [id number] [address], [place], [place] [address] Phone: [phone] Email: [email or web address]

## 2022-08-21 ENCOUNTER — Telehealth: Payer: Self-pay | Admitting: Family Medicine

## 2022-08-25 ENCOUNTER — Ambulatory Visit: Payer: Medicare Other | Admitting: Cardiology

## 2022-08-25 ENCOUNTER — Other Ambulatory Visit: Payer: Self-pay

## 2022-08-25 ENCOUNTER — Encounter: Payer: Self-pay | Admitting: Physician Assistant

## 2022-08-25 ENCOUNTER — Ambulatory Visit (INDEPENDENT_AMBULATORY_CARE_PROVIDER_SITE_OTHER): Payer: Medicare Other | Admitting: Physician Assistant

## 2022-08-25 ENCOUNTER — Ambulatory Visit (INDEPENDENT_AMBULATORY_CARE_PROVIDER_SITE_OTHER): Payer: Medicare Other

## 2022-08-25 VITALS — BP 136/73 | HR 87 | Ht 62.0 in | Wt 191.0 lb

## 2022-08-25 DIAGNOSIS — I482 Chronic atrial fibrillation, unspecified: Secondary | ICD-10-CM | POA: Diagnosis not present

## 2022-08-25 DIAGNOSIS — G3184 Mild cognitive impairment, so stated: Secondary | ICD-10-CM | POA: Diagnosis not present

## 2022-08-25 DIAGNOSIS — J9621 Acute and chronic respiratory failure with hypoxia: Secondary | ICD-10-CM

## 2022-08-25 DIAGNOSIS — I110 Hypertensive heart disease with heart failure: Secondary | ICD-10-CM

## 2022-08-25 DIAGNOSIS — J4520 Mild intermittent asthma, uncomplicated: Secondary | ICD-10-CM

## 2022-08-25 DIAGNOSIS — I2510 Atherosclerotic heart disease of native coronary artery without angina pectoris: Secondary | ICD-10-CM

## 2022-08-25 DIAGNOSIS — E1121 Type 2 diabetes mellitus with diabetic nephropathy: Secondary | ICD-10-CM

## 2022-08-25 DIAGNOSIS — G4733 Obstructive sleep apnea (adult) (pediatric): Secondary | ICD-10-CM | POA: Diagnosis not present

## 2022-08-25 DIAGNOSIS — G4700 Insomnia, unspecified: Secondary | ICD-10-CM

## 2022-08-25 DIAGNOSIS — K7581 Nonalcoholic steatohepatitis (NASH): Secondary | ICD-10-CM

## 2022-08-25 DIAGNOSIS — E1140 Type 2 diabetes mellitus with diabetic neuropathy, unspecified: Secondary | ICD-10-CM

## 2022-08-25 DIAGNOSIS — F319 Bipolar disorder, unspecified: Secondary | ICD-10-CM

## 2022-08-25 DIAGNOSIS — K760 Fatty (change of) liver, not elsewhere classified: Secondary | ICD-10-CM

## 2022-08-25 DIAGNOSIS — S91105D Unspecified open wound of left lesser toe(s) without damage to nail, subsequent encounter: Secondary | ICD-10-CM | POA: Diagnosis not present

## 2022-08-25 DIAGNOSIS — I272 Pulmonary hypertension, unspecified: Secondary | ICD-10-CM

## 2022-08-25 DIAGNOSIS — M545 Low back pain, unspecified: Secondary | ICD-10-CM

## 2022-08-25 DIAGNOSIS — I5033 Acute on chronic diastolic (congestive) heart failure: Secondary | ICD-10-CM

## 2022-08-25 DIAGNOSIS — E7800 Pure hypercholesterolemia, unspecified: Secondary | ICD-10-CM

## 2022-08-25 DIAGNOSIS — G2401 Drug induced subacute dyskinesia: Secondary | ICD-10-CM

## 2022-08-25 DIAGNOSIS — G8929 Other chronic pain: Secondary | ICD-10-CM

## 2022-08-25 NOTE — Progress Notes (Signed)
Assessment/Plan:   Arms and legs dyskinesia, resolved   Amber Stephenson is a very pleasant 77 y.o. RH female with extensive medical history including hypertension, hyperlipidemia, OSA not on CPAP (claustrophobia), DM2, CHF, A-fib on Eliquis,, CKD, chronic pain, history of pulmonary hypertension, bipolar disorder on multiple antipsychotics Mild Cognitive Impairment per Neuropsych evaluation on 08/2022 , seen today after presentation to the ED on 08/23/22  for hands ,arms, legs dyskinesia and walk, trouble enunciating words and  "could not get the word out " but no slurred speech. CT head was negative for acute findings. Lab were unremarkable.  Neurology was consulted and it was felt not to be due to seizures or stroke ( Dr. Leonel Ramsay) but tor metabolic versus medication related. Lyrica is now is at half dose 100 mg tid, and symptoms resolved, without recurrence since. She is now in her usual state of health.       Recommendations:   Follow up as scheduled for memory  Continue Lyrica at a reduced dose 100 mg tid as per prescribing provider. Continue pain management at pain clinic      Subjective:   This patient is accompanied in the office by her husband  who supplements the history. Previous records as well as any outside records available were reviewed prior to todays visit . As mentioned above, she presented with acute onset of dyskinesia of her arms and legs R>L.No clonus was noted, and she denies any numbness and tingling. No unilateral weakness. After Lyrica was reduced to 100 mg tid, her symptoms subsided . No further recurrence     CT head 3/2024negative for  acute intracranial abnormality. 2. Generalized cerebral atrophy and microvascular disease changes of the supratentorial brain.      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   Benign paroxysmal positional vertigo due to bilateral vestibular disorder 05/20/2019   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   Bipolar 1 disorder 01/23/2015   CAD (coronary artery disease)    Nonobstructive; Managed by Dr. Bronson Ing   Cardiomegaly 01/12/2018   CHF (congestive heart failure) 06/08/2022   Chronic anticoagulation 05/30/2015   Chronic back pain    Lower back   Chronic constipation 04/25/2020   Chronic diastolic heart failure [id number]   Chronic pain syndrome 08/22/2019   Chronic post-traumatic stress disorder (PTSD) 12/06/2020   Chronic respiratory failure with hypoxia 04/25/2020   Chronic right-sided low back pain with right-sided sciatica 07/19/2021   Cystitis 12/06/2020   Debility 12/06/2020   Diabetic neuropathy 02/06/2016   Dyslipidemia 09/24/2020   Dyspnea on exertion    Dysuria 04/16/2021   Elevated brain natriuretic peptide (BNP) level 12/14/2020   Elevated troponin 12/14/2020   Essential hypertension    Essential hypertriglyceridemia 12/06/2020   Fibromyalgia 03/19/2017   Functional diarrhea 10/26/2020   Generalized anxiety disorder 12/06/2020   Head trauma 09/17/2020   Hemorrhoids 03/11/2022   Herpes genitalis in women 07/16/2015   History of adenomatous polyp of colon 05/21/2019   Overview:   03/31/17: Colonoscopy: nonadvanced adenoma, microscopic colitis, f/u 5 yrs,  [facility]/GAP   Hypermetropia 12/06/2020   Hypocalcemia    Insomnia 01/23/2015   Lipoma 02/08/2015   Major depressive disorder 10/03/2015   Microalbuminuria 01/21/2022   Migraine headache with aura 02/12/2016   Mild intermittent  asthma 01/12/2018   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 Procedure scheduled for 06 FEB 07.   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 Wallace Keller   RLS (restless legs syndrome) 04/27/2015   S/P shoulder replacement, 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   Senile nuclear sclerosis 12/06/2020   Senile osteoporosis 12/15/2017   Sensory ataxia 03/28/2021   Superficial fungus infection of skin 09/03/2021   Tremor, essential 12/11/2021   Type II diabetes mellitus 09/24/2020   UTI (urinary tract infection) 12/14/2020   Vertigo 04/25/2019     Past Surgical History:  Procedure Laterality Date   BREAST REDUCTION SURGERY     EYE SURGERY Right    cateracts   HAMMER TOE SURGERY     LEFT HEART CATH AND CORONARY ANGIOGRAPHY N/A 02/03/2018   Procedure: LEFT HEART CATH AND CORONARY ANGIOGRAPHY;  Surgeon: Martinique, Peter M, MD;  Location: [facility];  Service: Cardiovascular;  Laterality: N/A;   REVERSE SHOULDER ARTHROPLASTY Right 08/19/2019   Procedure: REVERSE SHOULDER ARTHROPLASTY;  Surgeon: Netta Cedars, MD;  Location: [facility];  Service: Orthopedics;  Laterality: Right;  interscalene block   SHOULDER SURGERY Right    "I BROKE MY SHOUDLER   THIGH SURGERY     "TO REMOVE A TUMOR "     PREVIOUS  MEDICATIONS:   CURRENT MEDICATIONS:  Outpatient Encounter Medications as of 08/25/2022  Medication Sig   Acetaminophen (TYLENOL PO) Take 2 tablets by mouth as needed (pain/headache).   albuterol (PROVENTIL) (2.5 MG/3ML) 0.083% nebulizer solution Take 3 mLs (2.5 mg total) by nebulization every 6 (six) hours as needed for wheezing or shortness of breath. (Patient taking differently: Take 2.5 mg by nebulization as needed for wheezing or shortness of breath.)   albuterol (VENTOLIN HFA) 108 (90 Base) MCG/ACT inhaler Inhale 2 puffs into the lungs every 6 (six) hours as needed for wheezing or shortness of breath. (Patient taking differently: Inhale 2 puffs into the lungs as needed for wheezing or shortness of breath.)   apixaban (ELIQUIS) 5 MG TABS tablet TAKE 1 TABLET TWICE A DAY   ARIPiprazole (ABILIFY) 2 MG tablet Take 2 mg by mouth at bedtime.   atorvastatin (LIPITOR) 40 MG tablet TAKE 1 TABLET(40 MG) BY MOUTH DAILY (NEEDS TO BE SEEN BEFORE NEXT REFILL)   benztropine (COGENTIN) 1 MG tablet Take 1 tablet (1 mg total) by mouth 2 (two) times daily.   colchicine 0.6 MG tablet Take 1 tablet (0.6 mg total) by mouth 2 (two) times daily as needed.   Continuous Blood Gluc Sensor (DEXCOM G7 SENSOR) MISC Change sensor every  10 days   Continuous Blood Gluc Transmit (DEXCOM G6 TRANSMITTER) MISC Use as instructed to check blood sugar. Change every 90 days   denosumab (PROLIA) 60 MG/ML SOSY injection Inject 60 mg into the skin every 6 (six) months.   escitalopram (LEXAPRO) 10 MG tablet Take 10 mg by mouth daily.   fluconazole (DIFLUCAN) 150 MG tablet Take 1 now and can repeat 1 in 3 days   Fluticasone-Salmeterol (ADVAIR DISKUS) 500-50 MCG/DOSE AEPB Inhale 1 puff into the lungs in the morning and at bedtime. (Patient taking differently: Inhale 1 puff into the lungs as needed (SOB).)   furosemide (LASIX) 40 MG tablet Take 0.5 tablets (20 mg total) by mouth 2 (two) times daily. Take extra 20mg  in am if dyspnea, weight  gain of 3 lbs or greater, or edema.   Galcanezumab-gnlm (EMGALITY) 120 MG/ML SOAJ INJECT CONTENTS OF 1 PEN INTO THE SKIN ONCE MONTHLY   glucose blood (FREESTYLE LITE) test strip Check blood sugar 2-3 weekly   hydrOXYzine (VISTARIL) 25 MG capsule Take 1 capsule (25 mg total) by mouth every 8 (eight) hours as needed.   icosapent Ethyl (VASCEPA) 1 g capsule TAKE 2 CAPSULES TWICE A DAY WITH MEALS   insulin glargine (LANTUS SOLOSTAR) 100 UNIT/ML Solostar Pen Inject 68 Units into the skin daily. (Patient taking differently: Inject 34 Units into the skin 2 (two) times daily.)   insulin lispro (HUMALOG KWIKPEN) 100 UNIT/ML KwikPen Max daily 50 units (Patient taking differently: Inject 4-8 Units into the skin 3 (three) times daily.)   Insulin Pen Needle 29G X 12MM MISC 1 Device by Does not apply route daily in the afternoon.   lisinopril (ZESTRIL) 10 MG tablet Take 1 tablet (10 mg total) by mouth daily.   LORazepam (ATIVAN) 1 MG tablet Take 1 tablet (1 mg total) by mouth 2 (two) times daily. And give 2 tablets by mouth at bedtime (Patient taking differently: Take 1-2 mg by mouth 2 (two) times daily. Take 2 mg by mouth in the morning and 1 mg by mouth at bedtime)   metFORMIN (GLUCOPHAGE) 500 MG tablet Take 1 tablet (500 mg total) by mouth 2 (two) times daily with a meal. TAKE 1 TABLET(500 MG) BY MOUTH TWICE DAILY AFTER A MEAL (Patient taking differently: Take 500 mg by mouth 2 (two) times daily with a meal.)   metoprolol succinate (TOPROL XL) 25 MG 24 hr tablet Take 1 tablet (25 mg total) by mouth in the morning and at bedtime. (Patient taking differently: Take 25 mg by mouth in the morning and at bedtime. Take with 100 mg tablet twice daily)   metoprolol succinate (TOPROL-XL) 100 MG 24 hr tablet TAKE 1 TABLET IN THE MORNING AND AT BEDTIME (Patient taking differently: Take 100 mg by mouth in the morning and at bedtime. Take with 25 mg tablet twice daily.)   morphine (MS CONTIN) 15 MG 12 hr tablet Take 1 tablet  (15 mg total) by mouth every 12 (twelve) hours. This is a long acting form, so take 1 tab 2x/day to keep pain more even- it is NOT a short acting pain med like you were taking- for chronic pain   naloxone (NARCAN) nasal spray 4 mg/0.1 mL Place 1 spray into the nose once.   nystatin (MYCOSTATIN/NYSTOP) powder Apply 1 Application topically as needed (rash).   nystatin ointment (MYCOSTATIN) Apply 1 Application topically 2 (two) times daily. (Patient taking differently: Apply 1 Application topically as needed (rash).)   ondansetron (ZOFRAN-ODT) 8 MG disintegrating tablet Take  1 tablet (8 mg total) by mouth every 6 (six) hours as needed for nausea or vomiting.   OXcarbazepine (TRILEPTAL) 150 MG tablet Take 1 tablet (150 mg total) by mouth 2 (two) times daily.   potassium chloride SA (KLOR-CON M) 20 MEQ tablet Take 1 tablet (20 mEq total) by mouth daily. (Patient taking differently: Take 10 mEq by mouth at bedtime.)   pregabalin (LYRICA) 200 MG capsule TAKE 1 CAPSULE(200 MG) BY MOUTH TWICE DAILY (Patient taking differently: Take 200 mg by mouth 2 (two) times daily.)   senna-docusate (SENOKOT-S) 8.6-50 MG tablet Take 1 tablet by mouth at bedtime.   tirzepatide [facility]) 5 MG/0.5ML Pen Inject 5 mg into the skin once a week.   Vitamin D, Cholecalciferol, 25 MCG (1000 UT) TABS Take 1,000 Units by mouth daily in the afternoon.   Facility-Administered Encounter Medications as of 08/25/2022  Medication   bupivacaine-epinephrine (MARCAINE W/ EPI) 0.5% -1:200000 injection     Objective:     PHYSICAL EXAMINATION:    VITALS:   Vitals:   08/25/22 1429  BP: 136/73  Pulse: 87  SpO2: 95%  Weight: 191 lb (86.6 kg)  Height: 5\' 2"  (1.575 m)    GEN:  The patient appears stated age and is in NAD. HEENT:  Normocephalic, atraumatic.   Neurological examination:  General: NAD, well-groomed, appears stated age. Orientation: The patient is alert. Oriented to person, place and not to date Cranial nerves:  There is good facial symmetry.The speech is fluent and clear. No aphasia or dysarthria. Fund of knowledge is appropriate. Recent memory impaired and remote memory is normal.  Attention and concentration are normal.  Able to name objects and repeat phrases.  Hearing is intact to conversational tone .     Sensation: Sensation is intact to light touch throughout Motor: Strength is at least antigravity x4. Tremors: none  DTR's 2/4 in UE/LE      01/26/2019   11:00 AM 08/06/2018   11:00 AM  Montreal Cognitive Assessment   Visuospatial/ Executive (0/5) 2 4  Naming (0/3) 3 3  Attention: Read list of digits (0/2) 1 2  Attention: Read list of letters (0/1) 0 1  Attention: Serial 7 subtraction starting at 100 (0/3) 0 1  Language: Repeat phrase (0/2) 1 1  Language : Fluency (0/1) 0 0  Abstraction (0/2) 1 1  Delayed Recall (0/5) 0 4  Orientation (0/6) 3 6  Total 11 23       11/08/2021    7:00 AM 08/28/2020   11:00 AM 01/18/2018    4:00 PM  MMSE - Mini Mental State Exam  Orientation to time 5 4 5   Orientation to Place 5 5 5   Registration 3 3 3   Attention/ Calculation 3 1 3   Recall 3 3 3   Language- name 2 objects 2 2 2   Language- repeat 1 1 1   Language- follow 3 step command 3 3 3   Language- read & follow direction 1 1 1   Write a sentence 1 1 1   Copy design 1 1 1   Total score 28 25 28        Movement examination: Tone: There is normal tone in the UE/LE Abnormal movements:  no tremor.  No myoclonus.  No asterixis.   Coordination:  There is no decremation with RAM's. Normal finger to nose  Gait and Station: The patient has  difficulty arising out of a deep-seated chair without the use of the hands due to chronic pain and gout. The patient's stride length  is good.  Gait is cautious and narrow.   Thank you for allowing [place] the opportunity to participate in the care of this nice patient. Please do not hesitate to contact us for any questions or concerns.   Total time spent on today's visit was  24 minutes dedicated to this patient today, preparing to see patient, examining the patient, ordering tests and/or medications and counseling the patient, documenting clinical information in the EHR or other health record, independently interpreting results and communicating results to the patient/family, discussing treatment and goals, answering patient's questions and coordinating care.  Cc:  [name], MD  [name] 08/25/2022 6:09 PM

## 2022-08-25 NOTE — Patient Instructions (Addendum)
It was a pleasure to see you today at our office.   Recommendations:  Follow up as scheduled

## 2022-08-26 DIAGNOSIS — I110 Hypertensive heart disease with heart failure: Secondary | ICD-10-CM | POA: Diagnosis not present

## 2022-08-26 DIAGNOSIS — I482 Chronic atrial fibrillation, unspecified: Secondary | ICD-10-CM | POA: Diagnosis not present

## 2022-08-26 DIAGNOSIS — E1140 Type 2 diabetes mellitus with diabetic neuropathy, unspecified: Secondary | ICD-10-CM | POA: Diagnosis not present

## 2022-08-26 DIAGNOSIS — J9621 Acute and chronic respiratory failure with hypoxia: Secondary | ICD-10-CM | POA: Diagnosis not present

## 2022-08-26 DIAGNOSIS — S91105D Unspecified open wound of left lesser toe(s) without damage to nail, subsequent encounter: Secondary | ICD-10-CM | POA: Diagnosis not present

## 2022-08-26 DIAGNOSIS — I5033 Acute on chronic diastolic (congestive) heart failure: Secondary | ICD-10-CM | POA: Diagnosis not present

## 2022-08-27 ENCOUNTER — Ambulatory Visit (INDEPENDENT_AMBULATORY_CARE_PROVIDER_SITE_OTHER): Payer: Medicare Other | Admitting: Internal Medicine

## 2022-08-27 DIAGNOSIS — J4520 Mild intermittent asthma, uncomplicated: Secondary | ICD-10-CM

## 2022-08-27 DIAGNOSIS — G4733 Obstructive sleep apnea (adult) (pediatric): Secondary | ICD-10-CM

## 2022-08-27 LAB — PULMONARY FUNCTION TEST
DL/VA % pred: 106 %
DL/VA: 4.42 ml/min/mmHg/L
DLCO cor % pred: 63 %
DLCO cor: 11.27 ml/min/mmHg
DLCO unc % pred: 63 %
DLCO unc: 11.34 ml/min/mmHg
FEF 25-75 Post: 1.46 L/sec
FEF 25-75 Pre: 1.74 L/sec
FEF2575-%Change-Post: -16 %
FEF2575-%Pred-Post: 94 %
FEF2575-%Pred-Pre: 112 %
FEV1-%Change-Post: -2 %
FEV1-%Pred-Post: 70 %
FEV1-%Pred-Pre: 72 %
FEV1-Post: 1.36 L
FEV1-Pre: 1.4 L
FEV1FVC-%Change-Post: 1 %
FEV1FVC-%Pred-Pre: 111 %
FEV6-%Change-Post: -4 %
FEV6-%Pred-Post: 65 %
FEV6-%Pred-Pre: 68 %
FEV6-Post: 1.61 L
FEV6-Pre: 1.68 L
FEV6FVC-%Pred-Post: 105 %
FEV6FVC-%Pred-Pre: 105 %
FVC-%Change-Post: -4 %
FVC-%Pred-Post: 62 %
FVC-%Pred-Pre: 65 %
FVC-Post: 1.61 L
FVC-Pre: 1.68 L
Post FEV1/FVC ratio: 85 %
Post FEV6/FVC ratio: 100 %
Pre FEV1/FVC ratio: 83 %
Pre FEV6/FVC Ratio: 100 %
RV % pred: 55 %
RV: 1.22 L
TLC % pred: 67 %
TLC: 3.2 L

## 2022-08-27 NOTE — Progress Notes (Signed)
Full PFT performed today. °

## 2022-08-27 NOTE — Patient Instructions (Signed)
Full PFT performed today. °

## 2022-08-28 ENCOUNTER — Other Ambulatory Visit: Payer: Self-pay | Admitting: Family Medicine

## 2022-08-28 DIAGNOSIS — I5033 Acute on chronic diastolic (congestive) heart failure: Secondary | ICD-10-CM | POA: Diagnosis not present

## 2022-08-28 DIAGNOSIS — I110 Hypertensive heart disease with heart failure: Secondary | ICD-10-CM | POA: Diagnosis not present

## 2022-08-28 DIAGNOSIS — J9621 Acute and chronic respiratory failure with hypoxia: Secondary | ICD-10-CM | POA: Diagnosis not present

## 2022-08-28 DIAGNOSIS — I482 Chronic atrial fibrillation, unspecified: Secondary | ICD-10-CM | POA: Diagnosis not present

## 2022-08-28 DIAGNOSIS — S91105D Unspecified open wound of left lesser toe(s) without damage to nail, subsequent encounter: Secondary | ICD-10-CM | POA: Diagnosis not present

## 2022-08-28 DIAGNOSIS — E1140 Type 2 diabetes mellitus with diabetic neuropathy, unspecified: Secondary | ICD-10-CM | POA: Diagnosis not present

## 2022-08-29 ENCOUNTER — Other Ambulatory Visit (HOSPITAL_BASED_OUTPATIENT_CLINIC_OR_DEPARTMENT_OTHER): Payer: Self-pay

## 2022-08-29 ENCOUNTER — Other Ambulatory Visit: Payer: Self-pay

## 2022-08-29 ENCOUNTER — Emergency Department (HOSPITAL_BASED_OUTPATIENT_CLINIC_OR_DEPARTMENT_OTHER): Payer: Medicare Other | Admitting: Radiology

## 2022-08-29 ENCOUNTER — Emergency Department (HOSPITAL_BASED_OUTPATIENT_CLINIC_OR_DEPARTMENT_OTHER)
Admission: EM | Admit: 2022-08-29 | Discharge: 2022-08-29 | Disposition: A | Discharge status: Home / Self Care | Payer: Medicare Other | Attending: Emergency Medicine | Admitting: Emergency Medicine

## 2022-08-29 ENCOUNTER — Encounter (HOSPITAL_BASED_OUTPATIENT_CLINIC_OR_DEPARTMENT_OTHER): Payer: Self-pay

## 2022-08-29 DIAGNOSIS — E1140 Type 2 diabetes mellitus with diabetic neuropathy, unspecified: Secondary | ICD-10-CM | POA: Insufficient documentation

## 2022-08-29 DIAGNOSIS — Z7901 Long term (current) use of anticoagulants: Secondary | ICD-10-CM | POA: Diagnosis not present

## 2022-08-29 DIAGNOSIS — M545 Low back pain, unspecified: Secondary | ICD-10-CM | POA: Insufficient documentation

## 2022-08-29 DIAGNOSIS — S32020A Wedge compression fracture of second lumbar vertebra, initial encounter for closed fracture: Secondary | ICD-10-CM | POA: Diagnosis not present

## 2022-08-29 DIAGNOSIS — I10 Essential (primary) hypertension: Secondary | ICD-10-CM | POA: Insufficient documentation

## 2022-08-29 DIAGNOSIS — R0781 Pleurodynia: Secondary | ICD-10-CM | POA: Diagnosis not present

## 2022-08-29 DIAGNOSIS — Z79899 Other long term (current) drug therapy: Secondary | ICD-10-CM | POA: Diagnosis not present

## 2022-08-29 DIAGNOSIS — Z7984 Long term (current) use of oral hypoglycemic drugs: Secondary | ICD-10-CM | POA: Diagnosis not present

## 2022-08-29 DIAGNOSIS — M47814 Spondylosis without myelopathy or radiculopathy, thoracic region: Secondary | ICD-10-CM | POA: Diagnosis not present

## 2022-08-29 DIAGNOSIS — S20211A Contusion of right front wall of thorax, initial encounter: Secondary | ICD-10-CM | POA: Diagnosis not present

## 2022-08-29 DIAGNOSIS — I2510 Atherosclerotic heart disease of native coronary artery without angina pectoris: Secondary | ICD-10-CM | POA: Diagnosis not present

## 2022-08-29 DIAGNOSIS — W19XXXA Unspecified fall, initial encounter: Secondary | ICD-10-CM

## 2022-08-29 DIAGNOSIS — G20C Parkinsonism, unspecified: Secondary | ICD-10-CM | POA: Insufficient documentation

## 2022-08-29 DIAGNOSIS — W010XXA Fall on same level from slipping, tripping and stumbling without subsequent striking against object, initial encounter: Secondary | ICD-10-CM | POA: Insufficient documentation

## 2022-08-29 DIAGNOSIS — M546 Pain in thoracic spine: Secondary | ICD-10-CM | POA: Diagnosis not present

## 2022-08-29 DIAGNOSIS — S299XXA Unspecified injury of thorax, initial encounter: Secondary | ICD-10-CM | POA: Diagnosis present

## 2022-08-29 DIAGNOSIS — Z91040 Latex allergy status: Secondary | ICD-10-CM | POA: Diagnosis not present

## 2022-08-29 MED ORDER — OXYCODONE HCL 5 MG PO TABS
10.0000 mg | ORAL_TABLET | Freq: Once | ORAL | Status: AC
  Administered 2022-08-29: 10 mg via ORAL
  Filled 2022-08-29: qty 2

## 2022-08-29 NOTE — Discharge Instructions (Signed)
The x-rays did not show any signs of acute fracture.  Continue your morphine pain medication for pain.  You can also use the over-the-counter lidocaine patches.  Follow-up with your doctor to be rechecked if not improving in the next week or 2

## 2022-08-29 NOTE — ED Triage Notes (Signed)
In for right rib pain sec to fall from standing last pm around 7 pm. Also c/o right shoulder and right hip pain.reports hitting head but denies pain. Awake and alert.

## 2022-08-29 NOTE — ED Provider Notes (Signed)
[facility] Provider Note   CSN: [id number] Arrival date & time: 08/29/22  [id number]     History  Chief Complaint  Patient presents with   Amber Stephenson is a 77 y.o. female.   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  Patient states he tripped and fell yesterday evening.  Since that time she has been having pain in her right rib and her back.  The pain is severe.  It is sharp.  It increases with movement and palpation.  She denies any headache or loss of consciousness.  No shortness of breath.  No abdominal pain.  No pain in her lower extremities or upper extremities  Home Medications Prior to Admission medications   Medication Sig Start Date End Date Taking? Authorizing Provider  Acetaminophen (TYLENOL PO) Take 2 tablets by mouth as needed (pain/headache).    [provider]  albuterol (PROVENTIL) (2.5 MG/3ML) 0.083% nebulizer solution Take 3 mLs (2.5 mg total) by nebulization every 6 (six) hours as needed for wheezing or shortness of breath. Patient taking differently: Take 2.5 mg by nebulization as needed for wheezing or shortness of breath. 08/09/20   Gerlene Fee, NP  albuterol (VENTOLIN HFA) 108 (90 Base) MCG/ACT inhaler Inhale 2 puffs into the lungs every 6 (six) hours as needed for wheezing or shortness of breath. Patient taking differently: Inhale 2 puffs into the lungs as needed for wheezing or shortness of breath. 08/09/20   Gerlene Fee, NP  apixaban (ELIQUIS) 5 MG TABS tablet TAKE 1 TABLET TWICE A DAY 07/29/22   [name], MD  ARIPiprazole (ABILIFY) 2 MG tablet Take 2  mg by mouth at bedtime. 10/07/21   [provider]  atorvastatin (LIPITOR) 40 MG tablet TAKE 1 TABLET(40 MG) BY MOUTH DAILY (NEEDS TO BE SEEN BEFORE NEXT REFILL) 07/14/22   [name], MD  benztropine (COGENTIN) 1 MG tablet TAKE 1 TABLET(1 MG) BY MOUTH TWICE DAILY 08/28/22   [name], MD  colchicine 0.6 MG tablet Take 1 tablet (0.6 mg total) by mouth 2 (two) times daily as needed. 07/17/22   Dettinger, Fransisca Kaufmann, MD  Continuous Blood Gluc Sensor (DEXCOM G7 SENSOR) MISC Change sensor every 10 days 05/20/22   Shamleffer, Melanie Crazier, MD  Continuous Blood Gluc Transmit (DEXCOM G6 TRANSMITTER) MISC Use as instructed to check blood sugar. Change every 90 days 05/12/22   Shamleffer, Melanie Crazier, MD  denosumab (PROLIA) 60 MG/ML SOSY injection Inject 60 mg into the skin every 6 (six) months. 08/14/20   [name], MD  escitalopram (LEXAPRO) 10 MG tablet Take 10 mg by mouth daily. 10/13/21   [provider]  fluconazole (DIFLUCAN) 150 MG tablet Take 1 now and can repeat 1 in 3 days 06/19/22   Estill Dooms, NP  Fluticasone-Salmeterol (ADVAIR DISKUS) 500-50 MCG/DOSE AEPB Inhale 1 puff into the lungs in the morning and at bedtime. Patient taking differently: Inhale 1 puff into the lungs as needed (SOB). 08/09/20   Gerlene Fee, NP  furosemide (LASIX) 40 MG tablet Take 0.5 tablets (20 mg total) by mouth 2 (two) times daily. Take extra 20mg  in am if dyspnea, weight gain of 3  lbs or greater, or edema. 06/29/22 08/25/22  Manuella Ghazi, Pratik D, DO  Galcanezumab-gnlm [facility]) 120 MG/ML SOAJ INJECT CONTENTS OF 1 PEN INTO THE SKIN ONCE MONTHLY 08/11/22   [name] E, PA-C  glucose blood (FREESTYLE LITE) test strip Check blood sugar 2-3 weekly 08/04/22   Shamleffer, Melanie Crazier, MD  hydrOXYzine (VISTARIL) 25 MG capsule Take 1 capsule (25 mg total) by mouth every 8 (eight) hours as needed. 03/31/22   Sharion Balloon, FNP  icosapent Ethyl (VASCEPA) 1 g capsule TAKE 2 CAPSULES TWICE A  DAY WITH MEALS 08/20/22   [name], MD  insulin glargine (LANTUS SOLOSTAR) 100 UNIT/ML Solostar Pen Inject 68 Units into the skin daily. Patient taking differently: Inject 34 Units into the skin 2 (two) times daily. 06/09/22   Shamleffer, Melanie Crazier, MD  insulin lispro (HUMALOG KWIKPEN) 100 UNIT/ML KwikPen Max daily 50 units Patient taking differently: Inject 4-8 Units into the skin 3 (three) times daily. 05/28/22   Shamleffer, Melanie Crazier, MD  Insulin Pen Needle 29G X 12MM MISC 1 Device by Does not apply route daily in the afternoon. 05/28/22   Shamleffer, Melanie Crazier, MD  lisinopril (ZESTRIL) 10 MG tablet Take 1 tablet (10 mg total) by mouth daily. 01/14/22   [name], MD  LORazepam (ATIVAN) 1 MG tablet Take 1 tablet (1 mg total) by mouth 2 (two) times daily. And give 2 tablets by mouth at bedtime Patient taking differently: Take 1-2 mg by mouth 2 (two) times daily. Take 2 mg by mouth in the morning and 1 mg by mouth at bedtime 08/09/20   Gerlene Fee, NP  metFORMIN (GLUCOPHAGE) 500 MG tablet Take 1 tablet (500 mg total) by mouth 2 (two) times daily with a meal. TAKE 1 TABLET(500 MG) BY MOUTH TWICE DAILY AFTER A MEAL Patient taking differently: Take 500 mg by mouth 2 (two) times daily with a meal. 05/28/22   Shamleffer, Melanie Crazier, MD  metoprolol succinate (TOPROL XL) 25 MG 24 hr tablet Take 1 tablet (25 mg total) by mouth in the morning and at bedtime. Patient taking differently: Take 25 mg by mouth in the morning and at bedtime. Take with 100 mg tablet twice daily 11/13/21   Sueanne Margarita, MD  metoprolol succinate (TOPROL-XL) 100 MG 24 hr tablet TAKE 1 TABLET IN THE MORNING AND AT BEDTIME Patient taking differently: Take 100 mg by mouth in the morning and at bedtime. Take with 25 mg tablet twice daily. 11/13/21   Sueanne Margarita, MD  morphine (MS CONTIN) 15 MG 12 hr tablet Take 1 tablet (15 mg total) by mouth every 12 (twelve) hours. This is a long acting form, so take  1 tab 2x/day to keep pain more even- it is NOT a short acting pain med like you were taking- for chronic pain 08/19/22   Lovorn, Jinny Blossom, MD  naloxone [facility]) nasal spray 4 mg/0.1 mL Place 1 spray into the nose once. 08/08/21   [provider]  nystatin (MYCOSTATIN/NYSTOP) powder Apply 1 Application topically as needed (rash). 06/16/22   Shamleffer, Melanie Crazier, MD  nystatin ointment (MYCOSTATIN) Apply 1 Application topically 2 (two) times daily. Patient taking differently: Apply 1 Application topically as needed (rash). 12/27/21   Estill Dooms, NP  ondansetron (ZOFRAN-ODT) 8 MG disintegrating tablet Take 1 tablet (8 mg total) by mouth every 6 (six) hours as needed for nausea or vomiting. 01/13/22   [name], MD  OXcarbazepine (TRILEPTAL) 150 MG tablet Take 1 tablet (150 mg total)  by mouth 2 (two) times daily. 06/13/22   Raiford Noble Latif, DO  potassium chloride SA (KLOR-CON M) 20 MEQ tablet Take 1 tablet (20 mEq total) by mouth daily. Patient taking differently: Take 10 mEq by mouth at bedtime. 01/27/22   [name], MD  pregabalin (LYRICA) 200 MG capsule TAKE 1 CAPSULE(200 MG) BY MOUTH TWICE DAILY Patient taking differently: Take 200 mg by mouth 2 (two) times daily. 03/27/22   Lovorn, Jinny Blossom, MD  senna-docusate (SENOKOT-S) 8.6-50 MG tablet Take 1 tablet by mouth at bedtime. 06/13/22   Sheikh, Omair Latif, DO  tirzepatide [facility]) 5 MG/0.5ML Pen Inject 5 mg into the skin once a week. 08/19/22   Shamleffer, Melanie Crazier, MD  Vitamin D, Cholecalciferol, 25 MCG (1000 UT) TABS Take 1,000 Units by mouth daily in the afternoon. 07/26/20   [provider]      Allergies    Iodine, Ivp dye [iodinated contrast media], Latex, and Tizanidine    Review of Systems   Review of Systems  Physical Exam Updated Vital Signs BP 124/84 (BP Location: Right Arm)   Pulse (!) 107   Temp 98.2 F (36.8 C) (Oral)   Resp 18   Ht 1.575 m (5\' 2" )   Wt 86.6 kg   SpO2 95%   BMI 34.93  kg/m  Physical Exam Vitals and nursing note reviewed.  Constitutional:      Appearance: She is well-developed. She is not diaphoretic.  HENT:     Head: Normocephalic and atraumatic.     Right Ear: External ear normal.     Left Ear: External ear normal.  Eyes:     General: No scleral icterus.       Right eye: No discharge.        Left eye: No discharge.     Conjunctiva/sclera: Conjunctivae normal.  Neck:     Trachea: No tracheal deviation.  Cardiovascular:     Rate and Rhythm: Normal rate.  Pulmonary:     Effort: Pulmonary effort is normal. No respiratory distress.     Breath sounds: No stridor.  Chest:     Chest wall: Tenderness present.     Comments: Ttp right posterior chest wall Abdominal:     General: There is no distension.  Musculoskeletal:        General: No swelling or deformity.     Cervical back: Neck supple. No bony tenderness.     Thoracic back: Tenderness present.     Lumbar back: Tenderness present.     Comments: No tenderness palpation bilateral upper extremities or lower extremities,   Skin:    General: Skin is warm and dry.     Findings: No rash.  Neurological:     Mental Status: She is alert. Mental status is at baseline.     Cranial Nerves: No dysarthria or facial asymmetry.     Motor: No seizure activity.     ED Results / Procedures / Treatments   Labs (all labs ordered are listed, but only abnormal results are displayed) Labs Reviewed - No data to display  EKG None  Radiology DG Thoracic Spine 2 View  Result Date: 08/29/2022 CLINICAL DATA:  pain, fall EXAM: THORACIC SPINE 2 VIEWS COMPARISON:  CT 11/11/2021 FINDINGS: There are chronic compression deformities of T1, T9, and T11. Unchanged superior endplate Schmorl's node of T12. There is no evidence of new thoracic spine fracture radiographically. Slight dextroconvex lower thoracic curvature. Mild-to-moderate degenerative changes of the thoracic spine. IMPRESSION: Unchanged chronic compression  deformities of T1, T9 and T11. No evidence of new thoracic spine fracture. Mild to moderate degenerative changes of the thoracic spine. Electronically Signed   By: Maurine Simmering M.D.   On: 08/29/2022 11:18   DG Lumbar Spine Complete  Result Date: 08/29/2022 CLINICAL DATA:  fall, pain EXAM: LUMBAR SPINE - COMPLETE 4+ VIEW COMPARISON:  CT 11/11/2021 FINDINGS: Levoconvex curvature. There is a chronic compression fracture of L2, with up to 20% height loss, unchanged. There is no evidence of new lumbar spine fracture. Unchanged mild T11 compression deformity. There is mild to moderate multilevel degenerative disc disease, worst at L3-L4 and L4-L5. There is trace degenerative retrolisthesis at L4-L5 and anterolisthesis at L5-S1. There is moderate-severe lower lumbar predominant facet arthropathy. IMPRESSION: Chronic compression deformity of L2 without evidence of progressive height loss. No evidence of new lumbar spine fracture. Mild to moderate multilevel degenerative disc disease, worst at L3-L4 and L4-L5. Moderate-severe lower lumbar predominant facet arthropathy. Electronically Signed   By: Maurine Simmering M.D.   On: 08/29/2022 11:13   DG Ribs Unilateral W/Chest Right  Result Date: 08/29/2022 CLINICAL DATA:  Fall.  Rib pain. EXAM: RIGHT RIBS AND CHEST - 3+ VIEW COMPARISON:  Chest radiograph 06/29/2022 FINDINGS: No displaced rib fractures identified. Heart size and mediastinal contours are unremarkable. Mild chronic interstitial coarsening identified bilaterally. No superimposed airspace consolidation, pleural effusion or pneumothorax. Status post right shoulder arthroplasty. IMPRESSION: 1. No displaced rib fractures identified. 2. No acute cardiopulmonary abnormalities. Electronically Signed   By: Kerby Moors M.D.   On: 08/29/2022 11:12    Procedures Procedures    Medications Ordered in ED Medications  oxyCODONE (Oxy IR/ROXICODONE) immediate release tablet 10 mg (10 mg Oral Given 08/29/22 1030)    ED  Course/ Medical Decision Making/ A&P Clinical Course as of 08/29/22 1149  Fri Aug 29, 2022  1020 30 days morphine 15 mg filled on 3/22 [JK]  1130 X-rays show chronic changes in the spine.  No new fracture [JK]  1131 No acute fractures in the rib.  No new fracture in the thoracic spine [JK]    Clinical Course User Index [JK] Dorie Rank, MD                             Medical Decision Making Problems Addressed: Fall, initial encounter: acute illness or injury Rib contusion, right, initial encounter: acute illness or injury  Amount and/or Complexity of Data Reviewed Radiology: ordered and independent interpretation performed.  Risk Prescription drug management.   Patient presented to the ED for evaluation after a fall.  Patient has known history of chronic back pain.  She is on chronic morphine.  X-rays were performed and fortunately no signs of any acute fracture.  Patient does have chronic changes in her spine but no new abnormality.  Patient was given a dose of oxycodone for pain.  She does have morphine IR that she can take for pain at home.  Also recommend lidocaine patches.  Evaluation and diagnostic testing in the emergency department does not suggest an emergent condition requiring admission or immediate intervention beyond what has been performed at this time.  The patient is safe for discharge and has been instructed to return immediately for worsening symptoms, change in symptoms or any other concerns.        Final Clinical Impression(s) / ED Diagnoses Final diagnoses:  Fall, initial encounter  Rib contusion, right, initial encounter    Rx / DC  Orders ED Discharge Orders     None         Dorie Rank, MD 08/29/22 1151

## 2022-09-01 ENCOUNTER — Emergency Department (HOSPITAL_BASED_OUTPATIENT_CLINIC_OR_DEPARTMENT_OTHER): Payer: Medicare Other

## 2022-09-01 ENCOUNTER — Observation Stay (HOSPITAL_COMMUNITY): Payer: Medicare Other

## 2022-09-01 ENCOUNTER — Encounter (HOSPITAL_BASED_OUTPATIENT_CLINIC_OR_DEPARTMENT_OTHER): Payer: Self-pay | Admitting: Emergency Medicine

## 2022-09-01 ENCOUNTER — Ambulatory Visit: Payer: Medicare Other | Admitting: Adult Health

## 2022-09-01 ENCOUNTER — Other Ambulatory Visit: Payer: Self-pay

## 2022-09-01 ENCOUNTER — Inpatient Hospital Stay (HOSPITAL_BASED_OUTPATIENT_CLINIC_OR_DEPARTMENT_OTHER)
Admission: EM | Admit: 2022-09-01 | Discharge: 2022-09-04 | DRG: 091 | Disposition: A | Discharge status: Home Health | Payer: Medicare Other | Attending: Internal Medicine | Admitting: Internal Medicine

## 2022-09-01 DIAGNOSIS — Z794 Long term (current) use of insulin: Secondary | ICD-10-CM | POA: Diagnosis not present

## 2022-09-01 DIAGNOSIS — I10 Essential (primary) hypertension: Secondary | ICD-10-CM | POA: Diagnosis not present

## 2022-09-01 DIAGNOSIS — Z7901 Long term (current) use of anticoagulants: Secondary | ICD-10-CM

## 2022-09-01 DIAGNOSIS — F319 Bipolar disorder, unspecified: Secondary | ICD-10-CM | POA: Diagnosis present

## 2022-09-01 DIAGNOSIS — K7581 Nonalcoholic steatohepatitis (NASH): Secondary | ICD-10-CM | POA: Diagnosis present

## 2022-09-01 DIAGNOSIS — I272 Pulmonary hypertension, unspecified: Secondary | ICD-10-CM | POA: Diagnosis not present

## 2022-09-01 DIAGNOSIS — G894 Chronic pain syndrome: Secondary | ICD-10-CM | POA: Diagnosis present

## 2022-09-01 DIAGNOSIS — W19XXXA Unspecified fall, initial encounter: Secondary | ICD-10-CM | POA: Diagnosis present

## 2022-09-01 DIAGNOSIS — S301XXA Contusion of abdominal wall, initial encounter: Secondary | ICD-10-CM | POA: Diagnosis present

## 2022-09-01 DIAGNOSIS — E1165 Type 2 diabetes mellitus with hyperglycemia: Secondary | ICD-10-CM | POA: Diagnosis present

## 2022-09-01 DIAGNOSIS — M797 Fibromyalgia: Secondary | ICD-10-CM | POA: Diagnosis present

## 2022-09-01 DIAGNOSIS — Z96611 Presence of right artificial shoulder joint: Secondary | ICD-10-CM | POA: Diagnosis present

## 2022-09-01 DIAGNOSIS — I2510 Atherosclerotic heart disease of native coronary artery without angina pectoris: Secondary | ICD-10-CM | POA: Diagnosis not present

## 2022-09-01 DIAGNOSIS — K5909 Other constipation: Secondary | ICD-10-CM | POA: Diagnosis present

## 2022-09-01 DIAGNOSIS — R55 Syncope and collapse: Secondary | ICD-10-CM | POA: Diagnosis present

## 2022-09-01 DIAGNOSIS — E538 Deficiency of other specified B group vitamins: Secondary | ICD-10-CM | POA: Diagnosis present

## 2022-09-01 DIAGNOSIS — N179 Acute kidney failure, unspecified: Secondary | ICD-10-CM | POA: Diagnosis not present

## 2022-09-01 DIAGNOSIS — R579 Shock, unspecified: Secondary | ICD-10-CM

## 2022-09-01 DIAGNOSIS — E860 Dehydration: Secondary | ICD-10-CM | POA: Diagnosis present

## 2022-09-01 DIAGNOSIS — Z79899 Other long term (current) drug therapy: Secondary | ICD-10-CM

## 2022-09-01 DIAGNOSIS — Z7984 Long term (current) use of oral hypoglycemic drugs: Secondary | ICD-10-CM

## 2022-09-01 DIAGNOSIS — H8113 Benign paroxysmal vertigo, bilateral: Secondary | ICD-10-CM | POA: Diagnosis not present

## 2022-09-01 DIAGNOSIS — Z79891 Long term (current) use of opiate analgesic: Secondary | ICD-10-CM

## 2022-09-01 DIAGNOSIS — Z888 Allergy status to other drugs, medicaments and biological substances status: Secondary | ICD-10-CM

## 2022-09-01 DIAGNOSIS — N170 Acute kidney failure with tubular necrosis: Secondary | ICD-10-CM | POA: Diagnosis present

## 2022-09-01 DIAGNOSIS — E1140 Type 2 diabetes mellitus with diabetic neuropathy, unspecified: Secondary | ICD-10-CM | POA: Diagnosis present

## 2022-09-01 DIAGNOSIS — R791 Abnormal coagulation profile: Secondary | ICD-10-CM | POA: Diagnosis present

## 2022-09-01 DIAGNOSIS — A419 Sepsis, unspecified organism: Secondary | ICD-10-CM | POA: Diagnosis not present

## 2022-09-01 DIAGNOSIS — E119 Type 2 diabetes mellitus without complications: Secondary | ICD-10-CM

## 2022-09-01 DIAGNOSIS — R109 Unspecified abdominal pain: Secondary | ICD-10-CM | POA: Diagnosis not present

## 2022-09-01 DIAGNOSIS — E669 Obesity, unspecified: Secondary | ICD-10-CM | POA: Diagnosis present

## 2022-09-01 DIAGNOSIS — K760 Fatty (change of) liver, not elsewhere classified: Secondary | ICD-10-CM | POA: Diagnosis present

## 2022-09-01 DIAGNOSIS — F40240 Claustrophobia: Secondary | ICD-10-CM | POA: Diagnosis present

## 2022-09-01 DIAGNOSIS — E785 Hyperlipidemia, unspecified: Secondary | ICD-10-CM | POA: Diagnosis present

## 2022-09-01 DIAGNOSIS — G8929 Other chronic pain: Secondary | ICD-10-CM | POA: Diagnosis present

## 2022-09-01 DIAGNOSIS — Z91040 Latex allergy status: Secondary | ICD-10-CM

## 2022-09-01 DIAGNOSIS — R410 Disorientation, unspecified: Secondary | ICD-10-CM | POA: Diagnosis present

## 2022-09-01 DIAGNOSIS — I110 Hypertensive heart disease with heart failure: Secondary | ICD-10-CM | POA: Diagnosis present

## 2022-09-01 DIAGNOSIS — Z818 Family history of other mental and behavioral disorders: Secondary | ICD-10-CM

## 2022-09-01 DIAGNOSIS — G4733 Obstructive sleep apnea (adult) (pediatric): Secondary | ICD-10-CM | POA: Diagnosis present

## 2022-09-01 DIAGNOSIS — G9341 Metabolic encephalopathy: Secondary | ICD-10-CM | POA: Diagnosis present

## 2022-09-01 DIAGNOSIS — G928 Other toxic encephalopathy: Principal | ICD-10-CM | POA: Diagnosis present

## 2022-09-01 DIAGNOSIS — H8110 Benign paroxysmal vertigo, unspecified ear: Secondary | ICD-10-CM | POA: Diagnosis present

## 2022-09-01 DIAGNOSIS — I959 Hypotension, unspecified: Secondary | ICD-10-CM

## 2022-09-01 DIAGNOSIS — Z91041 Radiographic dye allergy status: Secondary | ICD-10-CM

## 2022-09-01 DIAGNOSIS — J309 Allergic rhinitis, unspecified: Secondary | ICD-10-CM | POA: Diagnosis present

## 2022-09-01 DIAGNOSIS — G2581 Restless legs syndrome: Secondary | ICD-10-CM | POA: Diagnosis present

## 2022-09-01 DIAGNOSIS — G4700 Insomnia, unspecified: Secondary | ICD-10-CM | POA: Diagnosis present

## 2022-09-01 DIAGNOSIS — I5032 Chronic diastolic (congestive) heart failure: Secondary | ICD-10-CM | POA: Diagnosis present

## 2022-09-01 DIAGNOSIS — S00411A Abrasion of right ear, initial encounter: Secondary | ICD-10-CM | POA: Diagnosis present

## 2022-09-01 DIAGNOSIS — R296 Repeated falls: Principal | ICD-10-CM

## 2022-09-01 DIAGNOSIS — Z833 Family history of diabetes mellitus: Secondary | ICD-10-CM

## 2022-09-01 DIAGNOSIS — F411 Generalized anxiety disorder: Secondary | ICD-10-CM | POA: Diagnosis present

## 2022-09-01 DIAGNOSIS — R4182 Altered mental status, unspecified: Secondary | ICD-10-CM | POA: Diagnosis not present

## 2022-09-01 DIAGNOSIS — Z7985 Long-term (current) use of injectable non-insulin antidiabetic drugs: Secondary | ICD-10-CM

## 2022-09-01 DIAGNOSIS — E8729 Other acidosis: Secondary | ICD-10-CM | POA: Diagnosis not present

## 2022-09-01 DIAGNOSIS — Z7951 Long term (current) use of inhaled steroids: Secondary | ICD-10-CM

## 2022-09-01 DIAGNOSIS — J4520 Mild intermittent asthma, uncomplicated: Secondary | ICD-10-CM | POA: Diagnosis not present

## 2022-09-01 DIAGNOSIS — Z8249 Family history of ischemic heart disease and other diseases of the circulatory system: Secondary | ICD-10-CM

## 2022-09-01 DIAGNOSIS — M461 Sacroiliitis, not elsewhere classified: Secondary | ICD-10-CM | POA: Diagnosis present

## 2022-09-01 DIAGNOSIS — R079 Chest pain, unspecified: Secondary | ICD-10-CM | POA: Diagnosis present

## 2022-09-01 DIAGNOSIS — F4312 Post-traumatic stress disorder, chronic: Secondary | ICD-10-CM | POA: Diagnosis present

## 2022-09-01 DIAGNOSIS — I482 Chronic atrial fibrillation, unspecified: Secondary | ICD-10-CM | POA: Diagnosis present

## 2022-09-01 DIAGNOSIS — R262 Difficulty in walking, not elsewhere classified: Secondary | ICD-10-CM | POA: Diagnosis present

## 2022-09-01 DIAGNOSIS — S0993XA Unspecified injury of face, initial encounter: Secondary | ICD-10-CM | POA: Diagnosis not present

## 2022-09-01 DIAGNOSIS — Z6833 Body mass index (BMI) 33.0-33.9, adult: Secondary | ICD-10-CM

## 2022-09-01 HISTORY — DX: Hypotension, unspecified: I95.9

## 2022-09-01 LAB — LACTIC ACID, PLASMA
Lactic Acid, Venous: 2.2 mmol/L (ref 0.5–1.9)
Lactic Acid, Venous: >9 mmol/L (ref 0.5–1.9)
Lactic Acid, Venous: 2.3 mmol/L (ref 0.5–1.9)
Lactic Acid, Venous: 1.5 mmol/L (ref 0.5–1.9)
Lactic Acid, Venous: 1.4 mmol/L (ref 0.5–1.9)

## 2022-09-01 LAB — COMPREHENSIVE METABOLIC PANEL
ALT: 10 U/L (ref 0–44)
AST: 22 U/L (ref 15–41)
Albumin: 4.1 g/dL (ref 3.5–5.0)
Alkaline Phosphatase: 55 U/L (ref 38–126)
Anion gap: 16 — ABNORMAL HIGH (ref 5–15)
BUN: 22 mg/dL (ref 8–23)
CO2: 23 mmol/L (ref 22–32)
Calcium: 9.4 mg/dL (ref 8.9–10.3)
Chloride: 98 mmol/L (ref 98–111)
Creatinine, Ser: 1.53 mg/dL — ABNORMAL HIGH (ref 0.44–1.00)
GFR, Estimated: 35 mL/min — ABNORMAL LOW (ref >60)
Glucose, Bld: 234 mg/dL — ABNORMAL HIGH (ref 70–99)
Potassium: 3.8 mmol/L (ref 3.5–5.1)
Sodium: 137 mmol/L (ref 135–145)
Total Bilirubin: 1.1 mg/dL (ref 0.3–1.2)
Total Protein: 7.7 g/dL (ref 6.5–8.1)

## 2022-09-01 LAB — CBC WITH DIFFERENTIAL/PLATELET
Abs Immature Granulocytes: 0.06 10*3/uL (ref 0.00–0.07)
Basophils Absolute: 0.1 10*3/uL (ref 0.0–0.1)
Basophils Relative: 1 %
Eosinophils Absolute: 0 10*3/uL (ref 0.0–0.5)
Eosinophils Relative: 0 %
HCT: 42.9 % (ref 36.0–46.0)
Hemoglobin: 14.4 g/dL (ref 12.0–15.0)
Immature Granulocytes: 1 %
Lymphocytes Relative: 24 %
Lymphs Abs: 2 10*3/uL (ref 0.7–4.0)
MCH: 29.5 pg (ref 26.0–34.0)
MCHC: 33.6 g/dL (ref 30.0–36.0)
MCV: 87.9 fL (ref 80.0–100.0)
Monocytes Absolute: 1 10*3/uL (ref 0.1–1.0)
Monocytes Relative: 12 %
Neutro Abs: 5.3 10*3/uL (ref 1.7–7.7)
Neutrophils Relative %: 62 %
Platelets: 157 10*3/uL (ref 150–400)
RBC: 4.88 MIL/uL (ref 3.87–5.11)
RDW: 14.1 % (ref 11.5–15.5)
WBC: 8.5 10*3/uL (ref 4.0–10.5)
nRBC: 0 % (ref 0.0–0.2)

## 2022-09-01 LAB — URINALYSIS, W/ REFLEX TO CULTURE (INFECTION SUSPECTED)
Bacteria, UA: NONE SEEN
Bilirubin Urine: NEGATIVE
Glucose, UA: NEGATIVE mg/dL
Ketones, ur: NEGATIVE mg/dL
Nitrite: NEGATIVE
Specific Gravity, Urine: 1.009 (ref 1.005–1.030)
pH: 6.5 (ref 5.0–8.0)

## 2022-09-01 LAB — I-STAT VENOUS BLOOD GAS, ED
Acid-Base Excess: 7 mmol/L — ABNORMAL HIGH (ref 0.0–2.0)
Bicarbonate: 34.5 mmol/L — ABNORMAL HIGH (ref 20.0–28.0)
Calcium, Ion: 1.09 mmol/L — ABNORMAL LOW (ref 1.15–1.40)
HCT: 36 % (ref 36.0–46.0)
Hemoglobin: 12.2 g/dL (ref 12.0–15.0)
O2 Saturation: 25 %
Patient temperature: 98.3
Potassium: 4.8 mmol/L (ref 3.5–5.1)
Sodium: 136 mmol/L (ref 135–145)
TCO2: 36 mmol/L — ABNORMAL HIGH (ref 22–32)
pCO2, Ven: 60.5 mmHg — ABNORMAL HIGH (ref 44–60)
pH, Ven: 7.363 (ref 7.25–7.43)
pO2, Ven: 18 mmHg — CL (ref 32–45)

## 2022-09-01 LAB — APTT: aPTT: 35 seconds (ref 24–36)

## 2022-09-01 LAB — VITAMIN B12: Vitamin B-12: 211 pg/mL (ref 180–914)

## 2022-09-01 LAB — AMMONIA: Ammonia: 16 umol/L (ref 9–35)

## 2022-09-01 LAB — PROTIME-INR
INR: 1.4 — ABNORMAL HIGH (ref 0.8–1.2)
Prothrombin Time: 16.5 seconds — ABNORMAL HIGH (ref 11.4–15.2)

## 2022-09-01 LAB — TROPONIN I (HIGH SENSITIVITY)
Troponin I (High Sensitivity): 38 ng/L — ABNORMAL HIGH (ref <18)
Troponin I (High Sensitivity): 30 ng/L — ABNORMAL HIGH (ref <18)

## 2022-09-01 LAB — TSH: TSH: 0.785 u[IU]/mL (ref 0.350–4.500)

## 2022-09-01 LAB — CK: Total CK: 387 U/L — ABNORMAL HIGH (ref 38–234)

## 2022-09-01 LAB — BRAIN NATRIURETIC PEPTIDE: B Natriuretic Peptide: 127.5 pg/mL — ABNORMAL HIGH (ref 0.0–100.0)

## 2022-09-01 MED ORDER — METRONIDAZOLE 500 MG/100ML IV SOLN
500.0000 mg | Freq: Two times a day (BID) | INTRAVENOUS | Status: DC
  Administered 2022-09-01: 500 mg via INTRAVENOUS
  Filled 2022-09-01: qty 100

## 2022-09-01 MED ORDER — MORPHINE SULFATE ER 15 MG PO TBCR
15.0000 mg | EXTENDED_RELEASE_TABLET | Freq: Two times a day (BID) | ORAL | Status: DC
  Administered 2022-09-02 – 2022-09-04 (×6): 15 mg via ORAL
  Filled 2022-09-01 (×6): qty 1

## 2022-09-01 MED ORDER — ARIPIPRAZOLE 2 MG PO TABS
2.0000 mg | ORAL_TABLET | Freq: Every day | ORAL | Status: DC
  Administered 2022-09-02 – 2022-09-03 (×2): 2 mg via ORAL
  Filled 2022-09-01 (×3): qty 1

## 2022-09-01 MED ORDER — VANCOMYCIN HCL IN DEXTROSE 1-5 GM/200ML-% IV SOLN
1000.0000 mg | Freq: Once | INTRAVENOUS | Status: AC
  Administered 2022-09-01: 1000 mg via INTRAVENOUS
  Filled 2022-09-01: qty 200

## 2022-09-01 MED ORDER — VANCOMYCIN HCL IN DEXTROSE 1-5 GM/200ML-% IV SOLN: 1000.0000 mg | INTRAVENOUS | Status: DC

## 2022-09-01 MED ORDER — INSULIN GLARGINE-YFGN 100 UNIT/ML ~~LOC~~ SOLN
12.0000 [IU] | Freq: Two times a day (BID) | SUBCUTANEOUS | Status: DC
  Administered 2022-09-01 – 2022-09-02 (×3): 12 [IU] via SUBCUTANEOUS
  Filled 2022-09-01 (×5): qty 0.12

## 2022-09-01 MED ORDER — LORAZEPAM 0.5 MG PO TABS
0.2500 mg | ORAL_TABLET | Freq: Two times a day (BID) | ORAL | Status: DC
  Administered 2022-09-01 – 2022-09-04 (×6): 0.25 mg via ORAL
  Filled 2022-09-01 (×6): qty 1

## 2022-09-01 MED ORDER — OXCARBAZEPINE 150 MG PO TABS
150.0000 mg | ORAL_TABLET | Freq: Two times a day (BID) | ORAL | Status: DC
  Administered 2022-09-01 – 2022-09-04 (×6): 150 mg via ORAL
  Filled 2022-09-01 (×8): qty 1

## 2022-09-01 MED ORDER — DILTIAZEM LOAD VIA INFUSION
10.0000 mg | Freq: Once | INTRAVENOUS | Status: DC
  Filled 2022-09-01: qty 10

## 2022-09-01 MED ORDER — LACTATED RINGERS IV BOLUS: 1000.0000 mL | Freq: Once | INTRAVENOUS | Status: DC

## 2022-09-01 MED ORDER — BENZTROPINE MESYLATE 1 MG PO TABS
1.0000 mg | ORAL_TABLET | Freq: Two times a day (BID) | ORAL | Status: DC
  Administered 2022-09-01 – 2022-09-04 (×6): 1 mg via ORAL
  Filled 2022-09-01 (×8): qty 1

## 2022-09-01 MED ORDER — MOMETASONE FURO-FORMOTEROL FUM 200-5 MCG/ACT IN AERO
2.0000 | INHALATION_SPRAY | Freq: Two times a day (BID) | RESPIRATORY_TRACT | Status: DC
  Administered 2022-09-02 – 2022-09-04 (×5): 2 via RESPIRATORY_TRACT
  Filled 2022-09-01: qty 8.8

## 2022-09-01 MED ORDER — ACETAMINOPHEN 650 MG RE SUPP: 650.0000 mg | Freq: Four times a day (QID) | RECTAL | Status: DC | PRN

## 2022-09-01 MED ORDER — FENTANYL CITRATE PF 50 MCG/ML IJ SOSY
50.0000 ug | PREFILLED_SYRINGE | Freq: Once | INTRAMUSCULAR | Status: AC
  Administered 2022-09-01: 50 ug via INTRAVENOUS
  Filled 2022-09-01: qty 1

## 2022-09-01 MED ORDER — LACTATED RINGERS IV SOLN: INTRAVENOUS | Status: DC

## 2022-09-01 MED ORDER — APIXABAN 5 MG PO TABS
5.0000 mg | ORAL_TABLET | Freq: Two times a day (BID) | ORAL | Status: DC
  Administered 2022-09-01 – 2022-09-04 (×6): 5 mg via ORAL
  Filled 2022-09-01 (×6): qty 1

## 2022-09-01 MED ORDER — ARIPIPRAZOLE 2 MG PO TABS: 2.0000 mg | ORAL_TABLET | Freq: Every day | ORAL | Status: DC

## 2022-09-01 MED ORDER — SODIUM CHLORIDE 0.9 % IV BOLUS
1000.0000 mL | Freq: Once | INTRAVENOUS | Status: AC
  Administered 2022-09-01: 1000 mL via INTRAVENOUS

## 2022-09-01 MED ORDER — LACTATED RINGERS IV BOLUS
1000.0000 mL | Freq: Once | INTRAVENOUS | Status: AC
  Administered 2022-09-01: 1000 mL via INTRAVENOUS

## 2022-09-01 MED ORDER — SODIUM CHLORIDE 0.9 % IV SOLN
2.0000 g | Freq: Two times a day (BID) | INTRAVENOUS | Status: DC
  Administered 2022-09-02: 2 g via INTRAVENOUS
  Filled 2022-09-01: qty 12.5

## 2022-09-01 MED ORDER — ESCITALOPRAM OXALATE 10 MG PO TABS: 10.0000 mg | ORAL_TABLET | Freq: Every day | ORAL | Status: DC

## 2022-09-01 MED ORDER — ONDANSETRON HCL 4 MG/2ML IJ SOLN: 4.0000 mg | Freq: Four times a day (QID) | INTRAMUSCULAR | Status: DC | PRN

## 2022-09-01 MED ORDER — LORAZEPAM 1 MG PO TABS: 1.0000 mg | ORAL_TABLET | Freq: Two times a day (BID) | ORAL | Status: DC

## 2022-09-01 MED ORDER — DILTIAZEM HCL-DEXTROSE 125-5 MG/125ML-% IV SOLN (PREMIX)
5.0000 mg/h | INTRAVENOUS | Status: DC
  Filled 2022-09-01: qty 125

## 2022-09-01 MED ORDER — SODIUM BICARBONATE 8.4 % IV SOLN
100.0000 meq | Freq: Once | INTRAVENOUS | Status: AC
  Administered 2022-09-01: 100 meq via INTRAVENOUS
  Filled 2022-09-01: qty 50

## 2022-09-01 MED ORDER — SODIUM CHLORIDE 0.9 % IV SOLN
2.0000 g | Freq: Once | INTRAVENOUS | Status: AC
  Administered 2022-09-01: 2 g via INTRAVENOUS
  Filled 2022-09-01: qty 12.5

## 2022-09-01 MED ORDER — HYDROXYZINE PAMOATE 25 MG PO CAPS: 25.0000 mg | ORAL_CAPSULE | Freq: Three times a day (TID) | ORAL | Status: DC | PRN

## 2022-09-01 MED ORDER — ESCITALOPRAM OXALATE 10 MG PO TABS
10.0000 mg | ORAL_TABLET | Freq: Every day | ORAL | Status: DC
  Administered 2022-09-02 – 2022-09-04 (×3): 10 mg via ORAL
  Filled 2022-09-01 (×3): qty 1

## 2022-09-01 MED ORDER — SODIUM CHLORIDE 0.45 % IV SOLN
INTRAVENOUS | Status: DC
  Filled 2022-09-01: qty 75

## 2022-09-01 MED ORDER — METRONIDAZOLE 500 MG/100ML IV SOLN
500.0000 mg | Freq: Once | INTRAVENOUS | Status: AC
  Administered 2022-09-01: 500 mg via INTRAVENOUS
  Filled 2022-09-01: qty 100

## 2022-09-01 MED ORDER — ENOXAPARIN SODIUM 40 MG/0.4ML IJ SOSY: 40.0000 mg | PREFILLED_SYRINGE | INTRAMUSCULAR | Status: DC

## 2022-09-01 MED ORDER — ALBUTEROL SULFATE (2.5 MG/3ML) 0.083% IN NEBU: 2.5000 mg | INHALATION_SOLUTION | RESPIRATORY_TRACT | Status: DC | PRN

## 2022-09-01 MED ORDER — ONDANSETRON HCL 4 MG PO TABS: 4.0000 mg | ORAL_TABLET | Freq: Four times a day (QID) | ORAL | Status: DC | PRN

## 2022-09-01 MED ORDER — ACETAMINOPHEN 325 MG PO TABS
650.0000 mg | ORAL_TABLET | Freq: Four times a day (QID) | ORAL | Status: DC | PRN
  Administered 2022-09-02: 650 mg via ORAL
  Filled 2022-09-01: qty 2

## 2022-09-01 MED ORDER — LACTATED RINGERS IV BOLUS
500.0000 mL | Freq: Once | INTRAVENOUS | Status: AC
  Administered 2022-09-01: 500 mL via INTRAVENOUS

## 2022-09-01 MED ORDER — ENOXAPARIN SODIUM 40 MG/0.4ML IJ SOSY: 40.0000 mg | PREFILLED_SYRINGE | Freq: Every day | INTRAMUSCULAR | Status: DC

## 2022-09-01 MED ORDER — MORPHINE SULFATE (PF) 4 MG/ML IV SOLN
4.0000 mg | Freq: Once | INTRAVENOUS | Status: DC
  Filled 2022-09-01: qty 1

## 2022-09-01 NOTE — ED Notes (Signed)
Report given to [facility]. 

## 2022-09-01 NOTE — Progress Notes (Signed)
Pharmacy Antibiotic Note  Amber Stephenson is a 77 y.o. female admitted on 09/01/2022 with sepsis. Pharmacy has been consulted for vancomycin and cefepime dosing. Pt is afebrile and WBC is WNL. SCr is elevated at 1.53 and lactic acid is elevated at 2.2.   Plan: Vancomycin 2g IV x 1 then 1g IV Q48H  Cefepime 2g IV Q12H F/u renal fxn, C&S, clinical status and peak/trough at SS  Weight: 83.5 kg (184 lb)  Temp (24hrs), Avg:98.3 F (36.8 C), Min:98.3 F (36.8 C), Max:98.3 F (36.8 C)  Recent Labs  Lab 09/01/22 1023  WBC 8.5  CREATININE 1.53*  LATICACIDVEN 2.2*    Estimated Creatinine Clearance: 31.8 mL/min (A) (by C-G formula based on SCr of 1.53 mg/dL (H)).    Allergies  Allergen Reactions   Iodine Anaphylaxis   Ivp Dye [Iodinated Contrast Media] Anaphylaxis and Swelling    Throat closes    Latex Other (See Comments)    Latex tape pulls skin with it   Tizanidine Other (See Comments)    Weakness, goofy, bad dreams    Antimicrobials this admission: Vanc 4/1>> Cefepime 4/1>> Flagyl x 1 4/1  Dose adjustments this admission: N/A  Microbiology results: Pending  Thank you for allowing pharmacy to be a part of this patient's care.  Mackensey Bolte, Rande Lawman 09/01/2022 11:06 AM

## 2022-09-01 NOTE — ED Notes (Signed)
I-stat obtained by RRT

## 2022-09-01 NOTE — Progress Notes (Signed)
EEG complete - results pending 

## 2022-09-01 NOTE — ED Notes (Signed)
Report given to the Floor RN. 

## 2022-09-01 NOTE — ED Provider Notes (Addendum)
[facility] Provider Note   CSN: [id number] Arrival date & time: 09/01/22  [id number]     History  Chief Complaint  Patient presents with   Amber Stephenson is a 77 y.o. female with past medical history as outlined below presents to the ED following a fall that occurred yesterday.  Husband states that yesterday he found the patient lying beside the bed around 430 to 5 PM.  He believes that she may have fallen from bed and her head was resting against hard stepstool stairs next to the bed.  Patient has been having frequent falls that are worsening.  Patient reports that she was getting up to go to the bathroom.  She is currently complaining of pain to the right side of her face and right side of her chest.  Patient is on blood thinners.  Patient was seen for fall on 08/29/2022 where she injured the right side of her body.  Per husband, patient has not been acting as herself.  Denies fever, chills, nausea, vomiting, diarrhea, abdominal pain.     Past Medical History:  Diagnosis Date   Acquired hammer toe 12/06/2020   Allergic rhinitis 02/24/2019   Ambulatory dysfunction 04/23/2020   Bipolar 1 disorder 01/23/2015   with GAD, benzo dependence   BPPV (benign paroxysmal positional vertigo) 05/20/2019   CAD (coronary artery disease)    Nonobstructive; Managed by Dr. Bronson Ing   Chronic constipation 04/25/2020   Chronic diastolic heart failure [id number]   Chronic pain syndrome 08/22/2019   back pain, sacroiliitis   Chronic post-traumatic stress disorder (PTSD) 12/06/2020   Diabetic neuropathy 02/06/2016   Dyslipidemia 09/24/2020   Essential hypertension    Fibromyalgia 03/19/2017   Functional diarrhea 10/26/2020   Head trauma 09/17/2020   Hemorrhoids 03/11/2022   Herpes genitalis in women 07/16/2015   Insomnia 01/23/2015   Migraine headache with aura 02/12/2016   Mild intermittent asthma 01/12/2018   Myofascial pain dysfunction  syndrome [id number]   Non-alcoholic fatty liver disease 01/12/2018   Opioid dependence 12/06/2020   OSA (obstructive sleep apnea) 02/24/2019   10/09/2018 - HST  - AHI 40.6    Osteopenia 12/06/2020   Rx alendronate 35 mg.   Pulmonary hypertension    RLS (restless legs syndrome) 04/27/2015   Tremor, essential 12/11/2021   Type II diabetes mellitus 09/24/2020       Home Medications Prior to Admission medications   Medication Sig Start Date End Date Taking? Authorizing Provider  Acetaminophen (TYLENOL PO) Take 2 tablets by mouth as needed (pain/headache).    [provider]  albuterol (PROVENTIL) (2.5 MG/3ML) 0.083% nebulizer solution Take 3 mLs (2.5 mg total) by nebulization every 6 (six) hours as needed for wheezing or shortness of breath. Patient taking differently: Take 2.5 mg by nebulization as needed for wheezing or shortness of breath. 08/09/20   Gerlene Fee, NP  albuterol (VENTOLIN HFA) 108 (90 Base) MCG/ACT inhaler Inhale 2 puffs into the lungs every 6 (six) hours as needed for wheezing or shortness of breath. Patient taking differently: Inhale 2 puffs into the lungs as needed for wheezing or shortness of breath. 08/09/20   Gerlene Fee, NP  apixaban (ELIQUIS) 5 MG TABS tablet TAKE 1 TABLET TWICE A DAY 07/29/22   [name], MD  ARIPiprazole (ABILIFY) 2 MG tablet Take 2 mg by mouth at bedtime. 10/07/21   [provider]  atorvastatin (LIPITOR) 40 MG tablet TAKE 1 TABLET(40 MG) BY MOUTH  DAILY (NEEDS TO BE SEEN BEFORE NEXT REFILL) 07/14/22   [name], MD  benztropine (COGENTIN) 1 MG tablet TAKE 1 TABLET(1 MG) BY MOUTH TWICE DAILY 08/28/22   [name], MD  colchicine 0.6 MG tablet Take 1 tablet (0.6 mg total) by mouth 2 (two) times daily as needed. 07/17/22   Dettinger, Fransisca Kaufmann, MD  Continuous Blood Gluc Sensor (DEXCOM G7 SENSOR) MISC Change sensor every 10 days 05/20/22   Shamleffer, Melanie Crazier, MD  Continuous Blood Gluc Transmit (DEXCOM G6  TRANSMITTER) MISC Use as instructed to check blood sugar. Change every 90 days 05/12/22   Shamleffer, Melanie Crazier, MD  denosumab (PROLIA) 60 MG/ML SOSY injection Inject 60 mg into the skin every 6 (six) months. 08/14/20   [name], MD  escitalopram (LEXAPRO) 10 MG tablet Take 10 mg by mouth daily. 10/13/21   [provider]  fluconazole (DIFLUCAN) 150 MG tablet Take 1 now and can repeat 1 in 3 days 06/19/22   Estill Dooms, NP  Fluticasone-Salmeterol (ADVAIR DISKUS) 500-50 MCG/DOSE AEPB Inhale 1 puff into the lungs in the morning and at bedtime. Patient taking differently: Inhale 1 puff into the lungs as needed (SOB). 08/09/20   Gerlene Fee, NP  furosemide (LASIX) 40 MG tablet Take 0.5 tablets (20 mg total) by mouth 2 (two) times daily. Take extra 20mg  in am if dyspnea, weight gain of 3 lbs or greater, or edema. 06/29/22 08/25/22  Manuella Ghazi, Pratik D, DO  Galcanezumab-gnlm [facility]) 120 MG/ML SOAJ INJECT CONTENTS OF 1 PEN INTO THE SKIN ONCE MONTHLY 08/11/22   [name] E, PA-C  glucose blood (FREESTYLE LITE) test strip Check blood sugar 2-3 weekly 08/04/22   Shamleffer, Melanie Crazier, MD  hydrOXYzine (VISTARIL) 25 MG capsule Take 1 capsule (25 mg total) by mouth every 8 (eight) hours as needed. 03/31/22   Sharion Balloon, FNP  icosapent Ethyl (VASCEPA) 1 g capsule TAKE 2 CAPSULES TWICE A DAY WITH MEALS 08/20/22   [name], MD  insulin glargine (LANTUS SOLOSTAR) 100 UNIT/ML Solostar Pen Inject 68 Units into the skin daily. Patient taking differently: Inject 34 Units into the skin 2 (two) times daily. 06/09/22   Shamleffer, Melanie Crazier, MD  insulin lispro (HUMALOG KWIKPEN) 100 UNIT/ML KwikPen Max daily 50 units Patient taking differently: Inject 4-8 Units into the skin 3 (three) times daily. 05/28/22   Shamleffer, Melanie Crazier, MD  Insulin Pen Needle 29G X 12MM MISC 1 Device by Does not apply route daily in the afternoon. 05/28/22   Shamleffer, Melanie Crazier, MD   lisinopril (ZESTRIL) 10 MG tablet Take 1 tablet (10 mg total) by mouth daily. 01/14/22   [name], MD  LORazepam (ATIVAN) 1 MG tablet Take 1 tablet (1 mg total) by mouth 2 (two) times daily. And give 2 tablets by mouth at bedtime Patient taking differently: Take 1-2 mg by mouth 2 (two) times daily. Take 2 mg by mouth in the morning and 1 mg by mouth at bedtime 08/09/20   Gerlene Fee, NP  metFORMIN (GLUCOPHAGE) 500 MG tablet Take 1 tablet (500 mg total) by mouth 2 (two) times daily with a meal. TAKE 1 TABLET(500 MG) BY MOUTH TWICE DAILY AFTER A MEAL Patient taking differently: Take 500 mg by mouth 2 (two) times daily with a meal. 05/28/22   Shamleffer, Melanie Crazier, MD  metoprolol succinate (TOPROL XL) 25 MG 24 hr tablet Take 1 tablet (25 mg total) by mouth in the morning and at bedtime. Patient taking differently: Take  25 mg by mouth in the morning and at bedtime. Take with 100 mg tablet twice daily 11/13/21   Sueanne Margarita, MD  metoprolol succinate (TOPROL-XL) 100 MG 24 hr tablet TAKE 1 TABLET IN THE MORNING AND AT BEDTIME Patient taking differently: Take 100 mg by mouth in the morning and at bedtime. Take with 25 mg tablet twice daily. 11/13/21   Sueanne Margarita, MD  morphine (MS CONTIN) 15 MG 12 hr tablet Take 1 tablet (15 mg total) by mouth every 12 (twelve) hours. This is a long acting form, so take 1 tab 2x/day to keep pain more even- it is NOT a short acting pain med like you were taking- for chronic pain 08/19/22   Lovorn, Jinny Blossom, MD  naloxone [facility]) nasal spray 4 mg/0.1 mL Place 1 spray into the nose once. 08/08/21   [provider]  nystatin (MYCOSTATIN/NYSTOP) powder Apply 1 Application topically as needed (rash). 06/16/22   Shamleffer, Melanie Crazier, MD  nystatin ointment (MYCOSTATIN) Apply 1 Application topically 2 (two) times daily. Patient taking differently: Apply 1 Application topically as needed (rash). 12/27/21   Estill Dooms, NP  ondansetron (ZOFRAN-ODT)  8 MG disintegrating tablet Take 1 tablet (8 mg total) by mouth every 6 (six) hours as needed for nausea or vomiting. 01/13/22   [name], MD  OXcarbazepine (TRILEPTAL) 150 MG tablet Take 1 tablet (150 mg total) by mouth 2 (two) times daily. 06/13/22   Raiford Noble Latif, DO  potassium chloride SA (KLOR-CON M) 20 MEQ tablet Take 1 tablet (20 mEq total) by mouth daily. Patient taking differently: Take 10 mEq by mouth at bedtime. 01/27/22   [name], MD  pregabalin (LYRICA) 200 MG capsule TAKE 1 CAPSULE(200 MG) BY MOUTH TWICE DAILY Patient taking differently: Take 200 mg by mouth 2 (two) times daily. 03/27/22   Lovorn, Jinny Blossom, MD  senna-docusate (SENOKOT-S) 8.6-50 MG tablet Take 1 tablet by mouth at bedtime. 06/13/22   Sheikh, Omair Latif, DO  tirzepatide [facility]) 5 MG/0.5ML Pen Inject 5 mg into the skin once a week. 08/19/22   Shamleffer, Melanie Crazier, MD  Vitamin D, Cholecalciferol, 25 MCG (1000 UT) TABS Take 1,000 Units by mouth daily in the afternoon. 07/26/20   [provider]      Allergies    Iodine, Ivp dye [iodinated contrast media], Latex, and Tizanidine    Review of Systems   Review of Systems  Constitutional:  Negative for chills and fever.  Respiratory:  Positive for shortness of breath.   Cardiovascular:  Positive for chest pain and palpitations.  Gastrointestinal:  Negative for abdominal pain, diarrhea, nausea and vomiting.  Genitourinary:  Negative for dysuria.  Musculoskeletal:  Positive for arthralgias.  Neurological:  Positive for weakness and headaches. Negative for dizziness and light-headedness.  Psychiatric/Behavioral:  Positive for confusion.     Physical Exam Updated Vital Signs BP 116/68 (BP Location: Left Arm)   Pulse 80   Temp 98.1 F (36.7 C) (Oral)   Resp (!) 26   Wt 83.5 kg   SpO2 97%   BMI 33.65 kg/m  Physical Exam Vitals and nursing note reviewed. Exam conducted with a chaperone present.  Constitutional:      General: She is in  acute distress.     Appearance: She is ill-appearing. She is not toxic-appearing or diaphoretic.  HENT:     Head: Normocephalic. Abrasion present. No raccoon eyes, Battle's sign, contusion, masses or laceration.     Jaw: There is normal jaw occlusion. No  tenderness.      Nose: Nose normal. No nasal deformity or signs of injury.     Mouth/Throat:     Mouth: Mucous membranes are dry.     Pharynx: Oropharynx is clear.  Eyes:     General: Vision grossly intact.     Extraocular Movements: Extraocular movements intact.     Conjunctiva/sclera: Conjunctivae normal.     Pupils: Pupils are equal, round, and reactive to light.  Cardiovascular:     Rate and Rhythm: Tachycardia present. Rhythm irregularly irregular.     Pulses: Normal pulses.     Heart sounds: Normal heart sounds.  Pulmonary:     Effort: Pulmonary effort is normal. Tachypnea present. No respiratory distress.     Breath sounds: Normal breath sounds and air entry. No decreased breath sounds.     Comments: Patient is tachypneic, taking shallow breaths and holding her right chest.  Chest:     Chest wall: Tenderness present. No crepitus.     Comments: Tenderness to palpation of right anterior chest wall with bruises that appear to be healing.  No new ecchymosis.   Abdominal:     General: Abdomen is flat. Bowel sounds are normal. There is no distension.     Palpations: Abdomen is soft.     Tenderness: There is no abdominal tenderness.  Musculoskeletal:     Cervical back: Full passive range of motion without pain. No spinous process tenderness or muscular tenderness.     Right lower leg: No edema.     Left lower leg: No edema.  Skin:    General: Skin is warm and dry.     Capillary Refill: Capillary refill takes less than 2 seconds.  Neurological:     Mental Status: She is alert. Mental status is at baseline.  Psychiatric:        Mood and Affect: Mood normal.        Behavior: Behavior normal.     ED Results / Procedures /  Treatments   Labs (all labs ordered are listed, but only abnormal results are displayed) Labs Reviewed  COMPREHENSIVE METABOLIC PANEL - Abnormal; Notable for the following components:      Result Value   Glucose, Bld 234 (*)    Creatinine, Ser 1.53 (*)    GFR, Estimated 35 (*)    Anion gap 16 (*)    All other components within normal limits  LACTIC ACID, PLASMA - Abnormal; Notable for the following components:   Lactic Acid, Venous 2.2 (*)    All other components within normal limits  LACTIC ACID, PLASMA - Abnormal; Notable for the following components:   Lactic Acid, Venous >9.0 (*)    All other components within normal limits  PROTIME-INR - Abnormal; Notable for the following components:   Prothrombin Time 16.5 (*)    INR 1.4 (*)    All other components within normal limits  URINALYSIS, W/ REFLEX TO CULTURE (INFECTION SUSPECTED) - Abnormal; Notable for the following components:   Hgb urine dipstick TRACE (*)    Protein, ur TRACE (*)    Leukocytes,Ua TRACE (*)    All other components within normal limits  CULTURE, BLOOD (ROUTINE X 2)  CULTURE, BLOOD (ROUTINE X 2)  CBC WITH DIFFERENTIAL/PLATELET  APTT  LACTIC ACID, PLASMA  I-STAT VENOUS BLOOD GAS, ED    EKG EKG Interpretation  Date/Time:  Monday September 01 2022 09:28:19 EDT Ventricular Rate:  153 PR Interval:    QRS Duration: 91 QT Interval:  310  QTC Calculation: 505 R Axis:   -31 Text Interpretation: Atrial fibrillation with rapid V-rate Left axis deviation Abnormal R-wave progression, late transition Repolarization abnormality, prob rate related Confirmed by Fredia Sorrow [phone]) on 09/01/2022 9:38:09 AM  Radiology CT Chest Wo Contrast  Result Date: 09/01/2022 CLINICAL DATA:  Patient found lying beside the bed yesterday afternoon. Abrasion to the right face and year. Multiple sites of pain including the right arm and chest. EXAM: CT CHEST WITHOUT CONTRAST TECHNIQUE: Multidetector CT imaging of the chest was performed  following the standard protocol without IV contrast. RADIATION DOSE REDUCTION: This exam was performed according to the departmental dose-optimization program which includes automated exposure control, adjustment of the mA and/or kV according to patient size and/or use of iterative reconstruction technique. COMPARISON:  06/28/2021. FINDINGS: Cardiovascular: Heart normal in size. No pericardial effusion. Mitral valve annular calcifications. Mild left coronary artery and circumflex coronary artery calcifications. Great vessels are normal in caliber. Mild aortic atherosclerosis. Mediastinum/Nodes: No neck base, mediastinal or hilar masses. No enlarged lymph nodes. Trachea and esophagus are unremarkable. Lungs/Pleura: No lung consolidation. No evidence of edema. Interstitial thickening most evident in the peripheral right upper lobe and lung bases. There are reticulonodular opacities peripherally in the right upper lobe with numerous calcified granuloma. Additional small calcified granuloma are seen in all remaining lobes. Findings are similar to the prior CT. No pleural effusion or pneumothorax. Upper Abdomen: Decreased attenuation of the liver consistent with fatty infiltration. Stable 9 mm left adrenal nodule consistent with an adenoma. No follow-up recommended. Musculoskeletal: No acute fracture or acute finding. No bone lesion. Partly imaged right shoulder reverse prosthesis appears well seated. IMPRESSION: 1. No acute findings.  No evidence of pneumonia. 2. Chronic lung findings as detailed stable from the prior CT. 3. Mild aortic atherosclerosis.  Coronary artery calcifications. 4. Hepatic steatosis. Aortic Atherosclerosis (ICD10-I70.0). Electronically Signed   By: Lajean Manes M.D.   On: 09/01/2022 11:35   CT Head Wo Contrast  Result Date: 09/01/2022 CLINICAL DATA:  Blunt facial trauma EXAM: CT HEAD WITHOUT CONTRAST CT MAXILLOFACIAL WITHOUT CONTRAST CT CERVICAL SPINE WITHOUT CONTRAST TECHNIQUE: Multidetector  CT imaging of the head, cervical spine, and maxillofacial structures were performed using the standard protocol without intravenous contrast. Multiplanar CT image reconstructions of the cervical spine and maxillofacial structures were also generated. RADIATION DOSE REDUCTION: This exam was performed according to the departmental dose-optimization program which includes automated exposure control, adjustment of the mA and/or kV according to patient size and/or use of iterative reconstruction technique. COMPARISON:  Head CT 08/16/2022 FINDINGS: CT HEAD FINDINGS Brain: No evidence of acute infarction, hemorrhage, hydrocephalus, extra-axial collection or mass lesion/mass effect. Vascular: No hyperdense vessel or unexpected calcification. Skull: Normal. Negative for fracture or focal lesion. CT MAXILLOFACIAL FINDINGS Osseous: No fracture or mandibular dislocation. No destructive process. Orbits: No evidence of injury Sinuses: Negative for hemosinus Soft tissues: No hematoma or foreign body seen. CT CERVICAL SPINE FINDINGS Alignment: No traumatic malalignment Skull base and vertebrae: No acute fracture. No primary bone lesion or focal pathologic process. Soft tissues and spinal canal: No prevertebral fluid or swelling. No visible canal hematoma. Disc levels: Ordinary cervical spine degeneration with endplate and facet spurring. Upper chest: Negative IMPRESSION: No evidence of intracranial or cervical spine injury. Negative for facial fracture. Electronically Signed   By: Jorje Guild M.D.   On: 09/01/2022 11:30   CT Cervical Spine Wo Contrast  Result Date: 09/01/2022 CLINICAL DATA:  Blunt facial trauma EXAM: CT HEAD WITHOUT CONTRAST CT MAXILLOFACIAL  WITHOUT CONTRAST CT CERVICAL SPINE WITHOUT CONTRAST TECHNIQUE: Multidetector CT imaging of the head, cervical spine, and maxillofacial structures were performed using the standard protocol without intravenous contrast. Multiplanar CT image reconstructions of the cervical  spine and maxillofacial structures were also generated. RADIATION DOSE REDUCTION: This exam was performed according to the departmental dose-optimization program which includes automated exposure control, adjustment of the mA and/or kV according to patient size and/or use of iterative reconstruction technique. COMPARISON:  Head CT 08/16/2022 FINDINGS: CT HEAD FINDINGS Brain: No evidence of acute infarction, hemorrhage, hydrocephalus, extra-axial collection or mass lesion/mass effect. Vascular: No hyperdense vessel or unexpected calcification. Skull: Normal. Negative for fracture or focal lesion. CT MAXILLOFACIAL FINDINGS Osseous: No fracture or mandibular dislocation. No destructive process. Orbits: No evidence of injury Sinuses: Negative for hemosinus Soft tissues: No hematoma or foreign body seen. CT CERVICAL SPINE FINDINGS Alignment: No traumatic malalignment Skull base and vertebrae: No acute fracture. No primary bone lesion or focal pathologic process. Soft tissues and spinal canal: No prevertebral fluid or swelling. No visible canal hematoma. Disc levels: Ordinary cervical spine degeneration with endplate and facet spurring. Upper chest: Negative IMPRESSION: No evidence of intracranial or cervical spine injury. Negative for facial fracture. Electronically Signed   By: Jorje Guild M.D.   On: 09/01/2022 11:30   CT Maxillofacial Wo Contrast  Result Date: 09/01/2022 CLINICAL DATA:  Blunt facial trauma EXAM: CT HEAD WITHOUT CONTRAST CT MAXILLOFACIAL WITHOUT CONTRAST CT CERVICAL SPINE WITHOUT CONTRAST TECHNIQUE: Multidetector CT imaging of the head, cervical spine, and maxillofacial structures were performed using the standard protocol without intravenous contrast. Multiplanar CT image reconstructions of the cervical spine and maxillofacial structures were also generated. RADIATION DOSE REDUCTION: This exam was performed according to the departmental dose-optimization program which includes automated exposure  control, adjustment of the mA and/or kV according to patient size and/or use of iterative reconstruction technique. COMPARISON:  Head CT 08/16/2022 FINDINGS: CT HEAD FINDINGS Brain: No evidence of acute infarction, hemorrhage, hydrocephalus, extra-axial collection or mass lesion/mass effect. Vascular: No hyperdense vessel or unexpected calcification. Skull: Normal. Negative for fracture or focal lesion. CT MAXILLOFACIAL FINDINGS Osseous: No fracture or mandibular dislocation. No destructive process. Orbits: No evidence of injury Sinuses: Negative for hemosinus Soft tissues: No hematoma or foreign body seen. CT CERVICAL SPINE FINDINGS Alignment: No traumatic malalignment Skull base and vertebrae: No acute fracture. No primary bone lesion or focal pathologic process. Soft tissues and spinal canal: No prevertebral fluid or swelling. No visible canal hematoma. Disc levels: Ordinary cervical spine degeneration with endplate and facet spurring. Upper chest: Negative IMPRESSION: No evidence of intracranial or cervical spine injury. Negative for facial fracture. Electronically Signed   By: Jorje Guild M.D.   On: 09/01/2022 11:30   DG Chest Port 1 View  Result Date: 09/01/2022 CLINICAL DATA:  Fall with hypotension EXAM: PORTABLE CHEST 1 VIEW COMPARISON:  08/29/2022 FINDINGS: Cardiomegaly. Bulky mitral annular calcification. Blunting at the lateral left costophrenic sulcus is from mediastinal fat. No edema, effusion, or convincing infiltrate. IMPRESSION: No acute finding when compared to prior. Electronically Signed   By: Jorje Guild M.D.   On: 09/01/2022 10:58    Procedures .Critical Care  Performed by: Pat Kocher, PA-C Authorized by: Pat Kocher, PA-C   Critical care provider statement:    Critical care time (minutes):  62   Critical care was necessary to treat or prevent imminent or life-threatening deterioration of the following conditions:  Circulatory failure, shock, sepsis and metabolic  crisis  Critical care was time spent personally by me on the following activities:  Ordering and performing treatments and interventions, ordering and review of laboratory studies, ordering and review of radiographic studies, pulse oximetry, re-evaluation of patient's condition, review of old charts, development of treatment plan with patient or surrogate, discussions with consultants, evaluation of patient's response to treatment, examination of patient and obtaining history from patient or surrogate   Care discussed with: admitting provider       Medications Ordered in ED Medications  vancomycin (VANCOCIN) IVPB 1000 mg/200 mL premix (has no administration in time range)  ceFEPIme (MAXIPIME) 2 g in sodium chloride 0.9 % 100 mL IVPB (has no administration in time range)  sodium bicarbonate injection 100 mEq (has no administration in time range)  lactated ringers bolus 1,000 mL (0 mLs Intravenous Stopped 09/01/22 1110)  fentaNYL (SUBLIMAZE) injection 50 mcg (50 mcg Intravenous Given 09/01/22 1051)  lactated ringers bolus 1,000 mL (0 mLs Intravenous Stopped 09/01/22 1110)  ceFEPIme (MAXIPIME) 2 g in sodium chloride 0.9 % 100 mL IVPB (0 g Intravenous Stopped 09/01/22 1209)  metroNIDAZOLE (FLAGYL) IVPB 500 mg (0 mg Intravenous Stopped 09/01/22 1325)  vancomycin (VANCOCIN) IVPB 1000 mg/200 mL premix (0 mg Intravenous Stopped 09/01/22 1325)  vancomycin (VANCOCIN) IVPB 1000 mg/200 mL premix (0 mg Intravenous Stopped 09/01/22 1349)  lactated ringers bolus 500 mL (0 mLs Intravenous Stopped 09/01/22 1209)  lactated ringers bolus 1,000 mL (0 mLs Intravenous Stopped 09/01/22 1325)    ED Course/ Medical Decision Making/ A&P                             Medical Decision Making Amount and/or Complexity of Data Reviewed Labs: ordered. Radiology: ordered.  Risk Prescription drug management. Decision regarding hospitalization.   This patient presents to the ED with chief complaint(s) of frequent falls, injury to  head, pain to right side chest from previous fall with pertinent past medical history of Afib on anticoagulation, DM, pulmonary hypertension, HTN.  The complaint involves an extensive differential diagnosis and also carries with it a high risk of complications and morbidity.    The differential diagnosis includes sepsis, shock, CVA, intracranial injury, skull fracture, maxillofacial fracture   The initial plan is to obtain sepsis workup, patient meets SIRS criteria, will also obtain CT head, c-spine, maxillofacial, and chest to assess for injury or cause of patient's hemodynamic instability  Additional history obtained: Additional history obtained from spouse, he reports patient has not "been herself" and has been having more frequent falls.  Patient fell yesterday in the afternoon and he found her beside the bed.  He does not know how long she was down for.   Records reviewed  ED visit from 08/29/22 where patient was evaluated after a mechanical fall  Initial Assessment:   Exam significant for an ill-appearing patient who appears to be in distress. She is tachypneic without adventitious lung sounds. She is taking shallow breaths.  She has tenderness to palpation of the right side anterior chest wall.  There are bruises in different healing stages in this area.  Patient has a small abrasion at the base of the right ear without bleeding.  No other obvious injuries.  Abdomen is soft and nontender to palpation.  Skin is warm and dry.    Independent ECG/labs interpretation:  The following labs were independently interpreted:  CBC without leukocytosis or anemia.  Metabolic panel with creatinine above patient's recent baseline.  LFTs are within  normal range.  Elevated anion gap.  PT and INR elevated, patient is anticoagulated.  UA without obvious UTI.  Initial lactic 2.2.  Repeat lactic resulted as greater than 9.  I-STAT venous blood gas was ordered.  Initial blood gas showed a pH of 7.23 with bicarb of  9.7.  A third lactic and repeat venous blood gas were ordered.  Lactic acid 2.3.  VBG with 34.5 bicarb, 18 pO2, pCO2 60.5 and a pH of 7.3.  Possible that previous values of lactic greater than 9 and bicarb of 9.7 were erroneous.  Blood cultures were collected and are pending.  Independent visualization and interpretation of imaging: I independently visualized the following imaging with scope of interpretation limited to determining acute life threatening conditions related to emergency care: CT head, neck, maxillofacial, and chest ordered, which revealed no acute abnormalities.  Chronic stable changes are present.  I agree with radiologist interpretation.    Treatment and Reassessment: Patient was treated with 2.5 L of LR IVF with significant improvement in patient's heart rate and blood pressure.  Patient's heart rate went from 160s down to 80s.  She is still in A-fib.  Blood pressures improved but are still soft.  Patient was also given broad-spectrum antibiotics to cover for unknown cause of sepsis with shock.  She was also given pain medicine.    Patient was given 100 mEq of sodium bicarbonate due to concern for very low HCO3 on venous blood gas.  Blood gas was repeated which had very different values.  Unsure if previous was erroneous.  No other bicarb given.  Patient continues to receive IV fluids.     Consultations obtained:  I requested consultation with critical care on-call provider and spoke with Dr. Johny Drilling who felt that due to patient being hemodynamically stable with improvement in vital signs, she did not require ICU admission at this time.  He recommended that should this change, he can be reconsulted.  I also requested consultation with on-call hospitalist provider and spoke with Dr. Jonah Blue who agreed with hospital admission.  Dr. Ophelia Charter recommended repeating lactic and VBG as the values appear to to be suspicious.   Disposition:   Patient to be admitted to [facility] for  frequent falls and shock.  Patient continues to have soft blood pressures, but is rate controlled without medication.  Should patient status change prior to transfer, will inform hospitalist.            Final Clinical Impression(s) / ED Diagnoses Final diagnoses:  Frequent falls  Hypotension, unspecified hypotension type    Rx / DC Orders ED Discharge Orders     None         Lenard Simmer, PA-C 09/01/22 1607    Lenard Simmer, PA-C 09/01/22 [id number]    Vanetta Mulders, MD 09/07/22 1901

## 2022-09-01 NOTE — ED Notes (Signed)
Report attempted to the floor... RN did not answer phone call.Marland KitchenMarland KitchenMarland Kitchen

## 2022-09-01 NOTE — Progress Notes (Signed)
Patient is not ready at this time. RN will notify technologist when patient is ready.

## 2022-09-01 NOTE — ED Notes (Signed)
Provider aware of only being able to obtain 1 blood culture... Requested antibiotics to be started.Marland KitchenMarland Kitchen

## 2022-09-01 NOTE — H&P (Signed)
History and Physical    Amber Stephenson [id number] DOB: 02-Nov-1945 DOA: 09/01/2022  PCP: [name], MD   Patient coming from: Home Chief Complaint  Patient presents with   Fall     HPI: 77 year old female with history of bipolar disorder with GAD, HTN, HLD BPPV, CAD nonobstructive, chronic diastolic CHF chronic pain syndrome/back pain/sacroiliitis, PTSD diabetes mellitus with neuropathy, nonalcoholic fatty liver disease OSA using nocturnal oxygen/pulmonary hypertension, restless leg syndrome, tremor lives with her husband who found patient lying beside bed at 4:30 - 5 PM 3/31 and patient reported she was getting up last night to go to bathroom-husband believes safe from bed and hit her head.  Patient is a poor historian. She does not remember very well about the events.  As per the chart husband reported she has not "been herself" and having more frequent falls. Patient has bruise on the right side of the abdomen.  She denies nausea, vomiting, fever, chills, diarrhea, abdominal pain, focal weakness.  She is having low appetite for some time not eating well but has been drinking. In the ED-BP was soft-initially only 87/64 and has been holding as low as 76/52, labs showed BMP with blood glucose 234, creatinine 1.5 anion gap 16 lactic acidosis 2.2 that increased to more than 9 and subsequently improved to 2.3 after receiving total 2.5 L bolus, CBC fairly normal no leukocytosis, UA with leukocytes, negative for ketones WBC 0-5 nitrate negative, able to obtain 1 set of blood culture> given cefepime, vancomycin, Flagyl sodium bicarb 1 amp.  PCCM was consulted with Dr. Boyd Kerbs who felt patient is safe for progressive care and arrived to the floor with blood pressure in [id number] systolic, feeling sleepy but able to wake up easily. Patient appeared ill not in distress, following sleepy, and tenderness to the palpation of the right-sided anterior chest wall and abdomen bruising different healing stages in  this area, abrasion on the right ear without bleeding no other obvious injuries. Finishing 1 L bolus after arrival BP has improved to 107 PCCM was consulted at the bedside.    Assessment/Plan  Hypotension Possible sepsis of unclear etiology POA Lactic acidosis: No obvious source of infection noted, possible sepsis-continue on empiric vancomycin cefepime and Flagyl pending blood culture only able to obtain 1 set of blood culture and patient will receive antibiotics.  UA unremarkable CT chest no acute finding.  Had some tenderness on the right upper quadrant and chest but has bruise there likely musculoskeletal but will get ultrasound abdomen for completeness.  PCCM consulted on arrival to the floor due to hypotension but responding with IV fluid 1 L given gentle IV fluids, due to CHF history check BNP and watch for fluid overload, trend lactic acid.  Subacute encephalopathy/?  Polypharmacy:Patient appears poor historian,CT head no acute finding in the ED.  Per husband she got short term memory issues- since her last ED visit she has been having ody pain on Saturday. Had periodic confusions. We will check TSH B12 ammonia , EEG, monitor neurocheck. address underlying hypotension,BP shows pCO2 60 likely indicating underlying chronic hypercapnia with OSA.  She recently had neurology f/u visit on 3/25 felt to have arms and legs dyskinesia - had mumbling of words and arm movement uncontrollable- and that improved after cutting down pregabalin dose. We will just cont 0.25 mg ativan instead of 1 mg bid, check ABG in am.  [id number] up at 1.5 previously 0.9-1.2.  Hydrated with IV fluids as above and monitor.  Check BMP, hold Lasix  and antihypertensives, management of pressure medication. Recent Labs    06/18/22 1149 06/23/22 1549 06/27/22 1415 06/27/22 1416 06/28/22 0247 06/29/22 0519 07/02/22 1155 07/11/22 1249 08/16/22 1916 09/01/22 1023  BUN 36* 25* 50* 45* 40* 23 13 18  31* 22  CREATININE  1.05* 0.97 2.73* 3.00* 1.74* 0.90 1.04* 0.92 1.28* 1.53*    Chronic diastolic heart failure Essential hypertension CAD: BP hypotensive, no obvious chest pain, check troponin for completeness EKG showed A-fib with RVR on presentation in the ED. recent echo from 06/09/2022 showed EF 65 to 70% with mild LVH no RWMA diastolic parameters indeterminate.  She is on Lasix 20 mg twice daily lisinopril 10 mg and Toprol-XL 125 BID ( based on pcp note) which will be held  Chronic atrial fibrillation: Currently rate controlled.  Looking at her neurology note from 08/25/2022 mentions he is on Eliquis for A-fib.  On metoprolol XL 125 daily at home.  Currently hold  Chronic back pain: Minimize opiates, continue with Tylenol and other medication  Type 2 diabetes mellitus with diabetic neuropathy Hyperlipidemia: Add sliding scale insulin.  PTA on 34  units Lantus bid, ssi and metformin. Resume home insulin slowly, start low dose tiniught, continue Lipitor 40 mg after checking CK level  OSA on nocturnal oxygen , not on CPAP due to claustrophobia, continue with same VBG reviewed showing chronic hypercapnia  History of gout on colchicine PRN  Bipolar 1 disorder/PTSD/Migraine/GAD: Med rec pending-based on PCP notes he is on Trileptal 150 mg twice daily, pregabalin, Abilify 2 mg bedtime, Lexapro 10 mg daily.  Resume pregabalin in the morning  Bppv Ambulatory dysfunction Frequent falls ?  Syncope: Monitor overnight on telemetry, obtain limited echocardiogram, check EEG.  PT OT evaluation tomorrow.  Check orthostatics once blood pressure improves.  She had neurology evaluation recently on 08/25/2022-with her arms and legs dyskinesia, or Lyrica was continue at half of dose 100 mg 3 times daily with the symptoms resolved without recurrence  Goals of care/CODE STATUS patient wishes to be full code. I discussed plan of care with patient's husband over the phone. Overall prognosis appears guarded at this time and  remains to be seen husband has verbalized understanding.  Class I obesity with BMI Body mass index is 33.65 kg/m.   Severity of Illness: The appropriate patient status for this patient is INPATIENT. Inpatient status is judged to be reasonable and necessary in order to provide the required intensity of service to ensure the patient's safety. The patient's presenting symptoms, physical exam findings, and initial radiographic and laboratory data in the context of their chronic comorbidities is felt to place them at high risk for further clinical deterioration. Furthermore, it is not anticipated that the patient will be medically stable for discharge from the hospital within 2 midnights of admission.   * I certify that at the point of admission it is my clinical judgment that the patient will require inpatient hospital care spanning beyond 2 midnights from the point of admission due to high intensity of service, high risk for further deterioration and high frequency of surveillance required.*   DVT prophylaxis: Place TED hose Start: 09/01/22 1845 Eliquis Code Status:   Code Status: Full Code  Family Communication: Admission, patients condition and plan of care including tests being ordered have been discussed with the patient  who indicate understanding and agree with the plan and Code Status.  Consults called:  PCCM  Review of Systems: All systems were reviewed and were negative except as mentioned in HPI above.  Negative for fever Negative for chest pain Negative for shortness of breath  Past Medical History:  Diagnosis Date   Acquired hammer toe 12/06/2020   Allergic rhinitis 02/24/2019   Ambulatory dysfunction 04/23/2020   Bipolar 1 disorder 01/23/2015   with GAD, benzo dependence   BPPV (benign paroxysmal positional vertigo) 05/20/2019   CAD (coronary artery disease)    Nonobstructive; Managed by Dr. 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   Pulmonary hypertension    RLS (restless legs syndrome) 04/27/2015   Tremor, essential 12/11/2021   Type II diabetes mellitus 09/24/2020    Past Surgical History:  Procedure Laterality Date   BREAST REDUCTION SURGERY     EYE SURGERY Right    cateracts   HAMMER TOE SURGERY     LEFT HEART CATH AND CORONARY ANGIOGRAPHY N/A 02/03/2018   Procedure: LEFT HEART CATH AND CORONARY ANGIOGRAPHY;  Surgeon: Martinique, Peter M, MD;  Location: [facility];  Service: Cardiovascular;  Laterality: N/A;   REVERSE SHOULDER ARTHROPLASTY Right 08/19/2019   Procedure: REVERSE SHOULDER ARTHROPLASTY;  Surgeon: Netta Cedars, MD;  Location: [facility];  Service: Orthopedics;  Laterality: Right;  interscalene block   SHOULDER SURGERY Right    "I BROKE MY SHOUDLER   THIGH SURGERY     "TO REMOVE A TUMOR "     reports that she has never smoked. She has never used smokeless tobacco. She reports that she does not currently use alcohol. She reports that she does not use drugs.  Allergies  Allergen Reactions   Iodine Anaphylaxis   Ivp Dye [Iodinated Contrast Media] Anaphylaxis and Swelling    Throat closes    Latex Other (See Comments)    Latex tape pulls skin with it   Tizanidine Other (See  Comments)    Weakness, goofy, bad dreams    Family History  Problem Relation Age of Onset   Alzheimer's disease Father    Diabetes Mother    Heart disease Mother    Stroke Brother    Heart disease Brother    Mental illness Brother    Diabetes Brother    Heart disease Sister        CABG   Diabetes Sister    Alcohol abuse Sister      Prior to Admission medications   Medication Sig Start Date End Date Taking? Authorizing Provider  Acetaminophen (TYLENOL PO) Take 2 tablets by mouth as needed (pain/headache).    [provider]  albuterol (PROVENTIL) (2.5 MG/3ML) 0.083% nebulizer solution Take 3 mLs (2.5 mg total) by nebulization every 6 (six) hours as needed for wheezing or shortness of breath. Patient taking differently: Take 2.5 mg by nebulization as needed for wheezing or shortness of breath. 08/09/20   Ok Edwards  S, NP  albuterol (VENTOLIN HFA) 108 (90 Base) MCG/ACT inhaler Inhale 2 puffs into the lungs every 6 (six) hours as needed for wheezing or shortness of breath. Patient taking differently: Inhale 2 puffs into the lungs as needed for wheezing or shortness of breath. 08/09/20   Gerlene Fee, NP  apixaban (ELIQUIS) 5 MG TABS tablet TAKE 1 TABLET TWICE A DAY 07/29/22   [name], MD  ARIPiprazole (ABILIFY) 2 MG tablet Take 2 mg by mouth at bedtime. 10/07/21   [provider]  atorvastatin (LIPITOR) 40 MG tablet TAKE 1 TABLET(40 MG) BY MOUTH DAILY (NEEDS TO BE SEEN BEFORE NEXT REFILL) 07/14/22   [name], MD  benztropine (COGENTIN) 1 MG tablet TAKE 1 TABLET(1 MG) BY MOUTH TWICE DAILY 08/28/22   [name], MD  colchicine 0.6 MG tablet Take 1 tablet (0.6 mg total) by mouth 2 (two) times daily as needed. 07/17/22   Dettinger, Fransisca Kaufmann, MD  Continuous Blood Gluc Sensor (DEXCOM G7 SENSOR) MISC Change sensor every 10 days 05/20/22   Shamleffer, Melanie Crazier, MD  Continuous Blood Gluc Transmit (DEXCOM G6 TRANSMITTER) MISC Use as instructed to check  blood sugar. Change every 90 days 05/12/22   Shamleffer, Melanie Crazier, MD  denosumab (PROLIA) 60 MG/ML SOSY injection Inject 60 mg into the skin every 6 (six) months. 08/14/20   [name], MD  escitalopram (LEXAPRO) 10 MG tablet Take 10 mg by mouth daily. 10/13/21   [provider]  fluconazole (DIFLUCAN) 150 MG tablet Take 1 now and can repeat 1 in 3 days 06/19/22   Estill Dooms, NP  Fluticasone-Salmeterol (ADVAIR DISKUS) 500-50 MCG/DOSE AEPB Inhale 1 puff into the lungs in the morning and at bedtime. Patient taking differently: Inhale 1 puff into the lungs as needed (SOB). 08/09/20   Gerlene Fee, NP  furosemide (LASIX) 40 MG tablet Take 0.5 tablets (20 mg total) by mouth 2 (two) times daily. Take extra 20mg  in am if dyspnea, weight gain of 3 lbs or greater, or edema. 06/29/22 08/25/22  Manuella Ghazi, Pratik D, DO  Galcanezumab-gnlm [facility]) 120 MG/ML SOAJ INJECT CONTENTS OF 1 PEN INTO THE SKIN ONCE MONTHLY 08/11/22   [name] E, PA-C  glucose blood (FREESTYLE LITE) test strip Check blood sugar 2-3 weekly 08/04/22   Shamleffer, Melanie Crazier, MD  hydrOXYzine (VISTARIL) 25 MG capsule Take 1 capsule (25 mg total) by mouth every 8 (eight) hours as needed. 03/31/22   Sharion Balloon, FNP  icosapent Ethyl (VASCEPA) 1 g capsule TAKE 2 CAPSULES TWICE A DAY WITH MEALS 08/20/22   [name], MD  insulin glargine (LANTUS SOLOSTAR) 100 UNIT/ML Solostar Pen Inject 68 Units into the skin daily. Patient taking differently: Inject 34 Units into the skin 2 (two) times daily. 06/09/22   Shamleffer, Melanie Crazier, MD  insulin lispro (HUMALOG KWIKPEN) 100 UNIT/ML KwikPen Max daily 50 units Patient taking differently: Inject 4-8 Units into the skin 3 (three) times daily. 05/28/22   Shamleffer, Melanie Crazier, MD  Insulin Pen Needle 29G X 12MM MISC 1 Device by Does not apply route daily in the afternoon. 05/28/22   Shamleffer, Melanie Crazier, MD  lisinopril (ZESTRIL) 10 MG tablet Take 1 tablet  (10 mg total) by mouth daily. 01/14/22   [name], MD  LORazepam (ATIVAN) 1 MG tablet Take 1 tablet (1 mg total) by mouth 2 (two) times daily. And give 2 tablets by mouth at bedtime Patient taking differently: Take 1-2 mg by mouth 2 (two) times daily. Take  2 mg by mouth in the morning and 1 mg by mouth at bedtime 08/09/20   Gerlene Fee, NP  metFORMIN (GLUCOPHAGE) 500 MG tablet Take 1 tablet (500 mg total) by mouth 2 (two) times daily with a meal. TAKE 1 TABLET(500 MG) BY MOUTH TWICE DAILY AFTER A MEAL Patient taking differently: Take 500 mg by mouth 2 (two) times daily with a meal. 05/28/22   Shamleffer, Melanie Crazier, MD  metoprolol succinate (TOPROL XL) 25 MG 24 hr tablet Take 1 tablet (25 mg total) by mouth in the morning and at bedtime. Patient taking differently: Take 25 mg by mouth in the morning and at bedtime. Take with 100 mg tablet twice daily 11/13/21   Sueanne Margarita, MD  metoprolol succinate (TOPROL-XL) 100 MG 24 hr tablet TAKE 1 TABLET IN THE MORNING AND AT BEDTIME Patient taking differently: Take 100 mg by mouth in the morning and at bedtime. Take with 25 mg tablet twice daily. 11/13/21   Sueanne Margarita, MD  morphine (MS CONTIN) 15 MG 12 hr tablet Take 1 tablet (15 mg total) by mouth every 12 (twelve) hours. This is a long acting form, so take 1 tab 2x/day to keep pain more even- it is NOT a short acting pain med like you were taking- for chronic pain 08/19/22   Lovorn, Jinny Blossom, MD  naloxone [facility]) nasal spray 4 mg/0.1 mL Place 1 spray into the nose once. 08/08/21   [provider]  nystatin (MYCOSTATIN/NYSTOP) powder Apply 1 Application topically as needed (rash). 06/16/22   Shamleffer, Melanie Crazier, MD  nystatin ointment (MYCOSTATIN) Apply 1 Application topically 2 (two) times daily. Patient taking differently: Apply 1 Application topically as needed (rash). 12/27/21   Estill Dooms, NP  ondansetron (ZOFRAN-ODT) 8 MG disintegrating tablet Take 1 tablet (8 mg  total) by mouth every 6 (six) hours as needed for nausea or vomiting. 01/13/22   [name], MD  OXcarbazepine (TRILEPTAL) 150 MG tablet Take 1 tablet (150 mg total) by mouth 2 (two) times daily. 06/13/22   Raiford Noble Latif, DO  potassium chloride SA (KLOR-CON M) 20 MEQ tablet Take 1 tablet (20 mEq total) by mouth daily. Patient taking differently: Take 10 mEq by mouth at bedtime. 01/27/22   [name], MD  pregabalin (LYRICA) 200 MG capsule TAKE 1 CAPSULE(200 MG) BY MOUTH TWICE DAILY Patient taking differently: Take 200 mg by mouth 2 (two) times daily. 03/27/22   Lovorn, Jinny Blossom, MD  senna-docusate (SENOKOT-S) 8.6-50 MG tablet Take 1 tablet by mouth at bedtime. 06/13/22   Sheikh, Omair Latif, DO  tirzepatide [facility]) 5 MG/0.5ML Pen Inject 5 mg into the skin once a week. 08/19/22   Shamleffer, Melanie Crazier, MD  Vitamin D, Cholecalciferol, 25 MCG (1000 UT) TABS Take 1,000 Units by mouth daily in the afternoon. 07/26/20   [provider]    Physical Exam: Vitals:   09/01/22 1748 09/01/22 1758 09/01/22 1759 09/01/22 1800  BP:  (!) 74/46  (!) 85/47  Pulse:   81 79  Resp: (!) 26 (!) 25 (!) 23 (!) 28  Temp:      TempSrc:      SpO2:   96% 98%  Weight:       General exam: Alert awake oriented appears poor historian, NAD, weak appearing. HEENT:Oral mucosa DRY, Ear/Nose WNL grossly, dentition normal. Respiratory system: bilaterally clear,no wheezing or crackles,no use of accessory muscle Cardiovascular system: S1 & S2 +, No JVD,. Gastrointestinal system: Abdomen soft, NT,ND, BS+ Nervous System:Alert,  awake, moving upper extremities lower extremities well-able to raise bilateral lower extremities, able to bend knees  Extremities: No edema, distal peripheral pulses palpable.  Skin: bruise on rt abdomen chest wall MSK: Normal muscle bulk,tone, power   Labs on Admission: I have personally reviewed following labs and imaging studies  CBC: Recent Labs  Lab 09/01/22 1023  09/01/22 1501  WBC 8.5  --   NEUTROABS 5.3  --   HGB 14.4 12.2  HCT 42.9 36.0  MCV 87.9  --   PLT 157  --    Basic Metabolic Panel: Recent Labs  Lab 09/01/22 1023 09/01/22 1501  NA 137 136  K 3.8 4.8  CL 98  --   CO2 23  --   GLUCOSE 234*  --   BUN 22  --   CREATININE 1.53*  --   CALCIUM 9.4  --    GFR: Estimated Creatinine Clearance: 31.8 mL/min (A) (by C-G formula based on SCr of 1.53 mg/dL (H)). Liver Function Tests: Recent Labs  Lab 09/01/22 1023  AST 22  ALT 10  ALKPHOS 55  BILITOT 1.1  PROT 7.7  ALBUMIN 4.1   No results for input(s): "LIPASE", "AMYLASE" in the last 168 hours. No results for input(s): "AMMONIA" in the last 168 hours. Coagulation Profile: Recent Labs  Lab 09/01/22 1023  INR 1.4*   Cardiac Enzymes: No results for input(s): "CKTOTAL", "CKMB", "CKMBINDEX", "TROPONINI" in the last 168 hours. BNP (last 3 results) No results for input(s): "PROBNP" in the last 8760 hours. HbA1C: No results for input(s): "HGBA1C" in the last 72 hours. CBG: No results for input(s): "GLUCAP" in the last 168 hours. Lipid Profile: No results for input(s): "CHOL", "HDL", "LDLCALC", "TRIG", "CHOLHDL", "LDLDIRECT" in the last 72 hours. Thyroid Function Tests: No results for input(s): "TSH", "T4TOTAL", "FREET4", "T3FREE", "THYROIDAB" in the last 72 hours. Anemia Panel: No results for input(s): "VITAMINB12", "FOLATE", "FERRITIN", "TIBC", "IRON", "RETICCTPCT" in the last 72 hours. Urine analysis:    Component Value Date/Time   COLORURINE YELLOW 09/01/2022 1304   APPEARANCEUR CLEAR 09/01/2022 1304   APPEARANCEUR Clear 03/26/2022 0815   LABSPEC 1.009 09/01/2022 1304   PHURINE 6.5 09/01/2022 1304   GLUCOSEU NEGATIVE 09/01/2022 1304   HGBUR TRACE (A) 09/01/2022 1304   BILIRUBINUR NEGATIVE 09/01/2022 1304   BILIRUBINUR Negative 03/26/2022 0815   KETONESUR NEGATIVE 09/01/2022 1304   PROTEINUR TRACE (A) 09/01/2022 1304   UROBILINOGEN 0.2 07/23/2015 1350   NITRITE  NEGATIVE 09/01/2022 1304   LEUKOCYTESUR TRACE (A) 09/01/2022 1304    Radiological Exams on Admission: CT Chest Wo Contrast  Result Date: 09/01/2022 CLINICAL DATA:  Patient found lying beside the bed yesterday afternoon. Abrasion to the right face and year. Multiple sites of pain including the right arm and chest. EXAM: CT CHEST WITHOUT CONTRAST TECHNIQUE: Multidetector CT imaging of the chest was performed following the standard protocol without IV contrast. RADIATION DOSE REDUCTION: This exam was performed according to the departmental dose-optimization program which includes automated exposure control, adjustment of the mA and/or kV according to patient size and/or use of iterative reconstruction technique. COMPARISON:  06/28/2021. FINDINGS: Cardiovascular: Heart normal in size. No pericardial effusion. Mitral valve annular calcifications. Mild left coronary artery and circumflex coronary artery calcifications. Great vessels are normal in caliber. Mild aortic atherosclerosis. Mediastinum/Nodes: No neck base, mediastinal or hilar masses. No enlarged lymph nodes. Trachea and esophagus are unremarkable. Lungs/Pleura: No lung consolidation. No evidence of edema. Interstitial thickening most evident in the peripheral right upper lobe and lung  bases. There are reticulonodular opacities peripherally in the right upper lobe with numerous calcified granuloma. Additional small calcified granuloma are seen in all remaining lobes. Findings are similar to the prior CT. No pleural effusion or pneumothorax. Upper Abdomen: Decreased attenuation of the liver consistent with fatty infiltration. Stable 9 mm left adrenal nodule consistent with an adenoma. No follow-up recommended. Musculoskeletal: No acute fracture or acute finding. No bone lesion. Partly imaged right shoulder reverse prosthesis appears well seated. IMPRESSION: 1. No acute findings.  No evidence of pneumonia. 2. Chronic lung findings as detailed stable from the  prior CT. 3. Mild aortic atherosclerosis.  Coronary artery calcifications. 4. Hepatic steatosis. Aortic Atherosclerosis (ICD10-I70.0). Electronically Signed   By: Lajean Manes M.D.   On: 09/01/2022 11:35   CT Head Wo Contrast  Result Date: 09/01/2022 CLINICAL DATA:  Blunt facial trauma EXAM: CT HEAD WITHOUT CONTRAST CT MAXILLOFACIAL WITHOUT CONTRAST CT CERVICAL SPINE WITHOUT CONTRAST TECHNIQUE: Multidetector CT imaging of the head, cervical spine, and maxillofacial structures were performed using the standard protocol without intravenous contrast. Multiplanar CT image reconstructions of the cervical spine and maxillofacial structures were also generated. RADIATION DOSE REDUCTION: This exam was performed according to the departmental dose-optimization program which includes automated exposure control, adjustment of the mA and/or kV according to patient size and/or use of iterative reconstruction technique. COMPARISON:  Head CT 08/16/2022 FINDINGS: CT HEAD FINDINGS Brain: No evidence of acute infarction, hemorrhage, hydrocephalus, extra-axial collection or mass lesion/mass effect. Vascular: No hyperdense vessel or unexpected calcification. Skull: Normal. Negative for fracture or focal lesion. CT MAXILLOFACIAL FINDINGS Osseous: No fracture or mandibular dislocation. No destructive process. Orbits: No evidence of injury Sinuses: Negative for hemosinus Soft tissues: No hematoma or foreign body seen. CT CERVICAL SPINE FINDINGS Alignment: No traumatic malalignment Skull base and vertebrae: No acute fracture. No primary bone lesion or focal pathologic process. Soft tissues and spinal canal: No prevertebral fluid or swelling. No visible canal hematoma. Disc levels: Ordinary cervical spine degeneration with endplate and facet spurring. Upper chest: Negative IMPRESSION: No evidence of intracranial or cervical spine injury. Negative for facial fracture. Electronically Signed   By: Jorje Guild M.D.   On: 09/01/2022 11:30    CT Cervical Spine Wo Contrast  Result Date: 09/01/2022 CLINICAL DATA:  Blunt facial trauma EXAM: CT HEAD WITHOUT CONTRAST CT MAXILLOFACIAL WITHOUT CONTRAST CT CERVICAL SPINE WITHOUT CONTRAST TECHNIQUE: Multidetector CT imaging of the head, cervical spine, and maxillofacial structures were performed using the standard protocol without intravenous contrast. Multiplanar CT image reconstructions of the cervical spine and maxillofacial structures were also generated. RADIATION DOSE REDUCTION: This exam was performed according to the departmental dose-optimization program which includes automated exposure control, adjustment of the mA and/or kV according to patient size and/or use of iterative reconstruction technique. COMPARISON:  Head CT 08/16/2022 FINDINGS: CT HEAD FINDINGS Brain: No evidence of acute infarction, hemorrhage, hydrocephalus, extra-axial collection or mass lesion/mass effect. Vascular: No hyperdense vessel or unexpected calcification. Skull: Normal. Negative for fracture or focal lesion. CT MAXILLOFACIAL FINDINGS Osseous: No fracture or mandibular dislocation. No destructive process. Orbits: No evidence of injury Sinuses: Negative for hemosinus Soft tissues: No hematoma or foreign body seen. CT CERVICAL SPINE FINDINGS Alignment: No traumatic malalignment Skull base and vertebrae: No acute fracture. No primary bone lesion or focal pathologic process. Soft tissues and spinal canal: No prevertebral fluid or swelling. No visible canal hematoma. Disc levels: Ordinary cervical spine degeneration with endplate and facet spurring. Upper chest: Negative IMPRESSION: No evidence of intracranial or cervical  spine injury. Negative for facial fracture. Electronically Signed   By: Jorje Guild M.D.   On: 09/01/2022 11:30   CT Maxillofacial Wo Contrast  Result Date: 09/01/2022 CLINICAL DATA:  Blunt facial trauma EXAM: CT HEAD WITHOUT CONTRAST CT MAXILLOFACIAL WITHOUT CONTRAST CT CERVICAL SPINE WITHOUT CONTRAST  TECHNIQUE: Multidetector CT imaging of the head, cervical spine, and maxillofacial structures were performed using the standard protocol without intravenous contrast. Multiplanar CT image reconstructions of the cervical spine and maxillofacial structures were also generated. RADIATION DOSE REDUCTION: This exam was performed according to the departmental dose-optimization program which includes automated exposure control, adjustment of the mA and/or kV according to patient size and/or use of iterative reconstruction technique. COMPARISON:  Head CT 08/16/2022 FINDINGS: CT HEAD FINDINGS Brain: No evidence of acute infarction, hemorrhage, hydrocephalus, extra-axial collection or mass lesion/mass effect. Vascular: No hyperdense vessel or unexpected calcification. Skull: Normal. Negative for fracture or focal lesion. CT MAXILLOFACIAL FINDINGS Osseous: No fracture or mandibular dislocation. No destructive process. Orbits: No evidence of injury Sinuses: Negative for hemosinus Soft tissues: No hematoma or foreign body seen. CT CERVICAL SPINE FINDINGS Alignment: No traumatic malalignment Skull base and vertebrae: No acute fracture. No primary bone lesion or focal pathologic process. Soft tissues and spinal canal: No prevertebral fluid or swelling. No visible canal hematoma. Disc levels: Ordinary cervical spine degeneration with endplate and facet spurring. Upper chest: Negative IMPRESSION: No evidence of intracranial or cervical spine injury. Negative for facial fracture. Electronically Signed   By: Jorje Guild M.D.   On: 09/01/2022 11:30   DG Chest Port 1 View  Result Date: 09/01/2022 CLINICAL DATA:  Fall with hypotension EXAM: PORTABLE CHEST 1 VIEW COMPARISON:  08/29/2022 FINDINGS: Cardiomegaly. Bulky mitral annular calcification. Blunting at the lateral left costophrenic sulcus is from mediastinal fat. No edema, effusion, or convincing infiltrate. IMPRESSION: No acute finding when compared to prior. Electronically  Signed   By: Jorje Guild M.D.   On: 09/01/2022 10:58    Antonieta Pert MD [facility]  If 7PM-7AM, please contact night-coverage [email or web address]  09/01/2022, 6:56 PM

## 2022-09-01 NOTE — ED Triage Notes (Signed)
Pt arrives pov, to triage in wheelchair,. Husbands states that he found pt. lying beside bed at ~ 1630 yesterday. PT reports getting up last night to go to bathroom, abrasion noted to RT side of face anterior to RT ear. Pt endorse thinners. Pt reports multiple pain sites, including chest, RT arm and RT ear

## 2022-09-01 NOTE — ED Notes (Signed)
Pt aware of the need for a urine... Unable to currently provide... 

## 2022-09-01 NOTE — Progress Notes (Signed)
Plan of Care Note for accepted transfer   Patient: Amber Stephenson MRN: [id number]   [place]: 09/01/2022  Facility requesting transfer: [facility] Requesting Provider: Rogene Houston Reason for transfer: Sepsis  Facility course: Patient with h/o bipolar d/o, CAD, chronic pain, chronic diastolic CHF, HLD, HTN, fibromyalgia, functional diarrhea, NASH, OSA, RLS, afib on AC, and DM presenting with a fall.  Frequent falls, most recently yesterday PM and hit head, on AC.  BPs 60s/40s, HR 80s, BP 114/71.  AMS, tachycardia, tachypnea.  Normal WBC.  Elevated gap and lactate elevated. Repeat lactate 9.7.  D/w Dr. Tacy Learn, thinks she is ok for progressive.  Waiting for 3rd lactate.  VBG with pH 7.23, HCO3 9.7.  Started on Flagyl/Vanc/Cefepime for sepsis of uncertain etiology.   On bicarb drip.  Given the remainder of her generally normal (corrected BP) vitals and labs, the lactate and bicarb are suspect and I have asked for them to be repeated.    Plan of care: The patient is accepted for admission to Progressive unit, at [facility].   Author: Karmen Bongo, MD 09/01/2022  Check [email or web address] for on-call coverage.  Nursing staff, Please call [place] number on Amion as soon as patient's arrival, so appropriate admitting provider can evaluate the pt.

## 2022-09-01 NOTE — Progress Notes (Signed)
RT attempted to crossover the VBG 4 times. The VBG did not crossover. The results PH 7.23 PCO2 23.3 PO2 20 Bicarb 9.7 On RA The Ptis on 2 L now.

## 2022-09-01 NOTE — ED Notes (Signed)
Amber Stephenson with cl called for transport

## 2022-09-01 NOTE — Hospital Course (Signed)
77 year old female with history of bipolar disorder with GAD, HTN, HLD BPPV, CAD nonobstructive, chronic diastolic CHF chronic pain syndrome/back pain/sacroiliitis, PTSD diabetes mellitus with neuropathy, nonalcoholic fatty liver disease OSA using nocturnal oxygen/pulmonary hypertension, restless leg syndrome, tremor lives with her husband who found patient lying beside bed at 4:30 - 5 PM 3/31 and patient reported she was getting up last night to go to bathroom-husband believes safe from bed and hit her head.  Patient is a poor historian. She does not remember very well about the events.  As per the chart husband reported she has not "been herself" and having more frequent falls. Patient has bruise on the right side of the abdomen.  She denies nausea, vomiting, fever, chills, diarrhea, abdominal pain, focal weakness.  She is having low appetite for some time not eating well but has been drinking. In the ED-BP was soft-initially only 87/64 and has been holding as low as 76/52, labs showed BMP with blood glucose 234, creatinine 1.5 anion gap 16 lactic acidosis 2.2 that increased to more than 9 and subsequently improved to 2.3 after receiving total 2.5 L bolus, CBC fairly normal no leukocytosis, UA with leukocytes, negative for ketones WBC 0-5 nitrate negative, able to obtain 1 set of blood culture> given cefepime, vancomycin, Flagyl sodium bicarb 1 amp.  PCCM was consulted with Dr. Boyd Kerbs who felt patient is safe for progressive care and arrived to the floor with blood pressure in [id number] systolic, feeling sleepy but able to wake up easily. Patient appeared ill not in distress, following sleepy, and tenderness to the palpation of the right-sided anterior chest wall and abdomen bruising different healing stages in this area, abrasion on the right ear without bleeding no other obvious injuries. Finishing 1 L bolus after arrival BP has improved to 107 PCCM was consulted at the bedside.

## 2022-09-01 NOTE — Progress Notes (Signed)
NAME:  Amber Stephenson, MRN:  [id number], DOB:  1946/03/09, LOS: 0 ADMISSION DATE:  09/01/2022, CONSULTATION DATE:  4/1 REFERRING MD:  Dr. Lorin Mercy, CHIEF COMPLAINT:  fall; hypotension   History of Present Illness:  Patient is a 77 year old female with pertinent PMH T2DM, NASH, afib on ac, pulmonary hypertension, OSA on 2 LNC at night, HTN, HLD, chronic diastolic heart failure, CAD, bipolar 1 presents to [facility] on 4/1 with fall/syncope.  On 3/31 husband found patient laying on ground at 4:30 PM.  Patient does not recall episode of falling. Patient is on blood thinners. Patient hit right-sided head and chest against stepstool.  Per husband patient has been having frequent episodes of falls over the past. Was recently seen in ED on 3/29 for fall. Patient states she did take her anti-hypertensive's this am. Came to [facility] on 4/1 because patient was "not acting like herself".  Upon arrival to [facility] ED patient complaining of pain upon palpation on her chest from fall. Small abrasion above ear on right side. Shallow respirations due to pain. Patient also more confused. Tachycardic and hypotensive 60/40. Afebrile. Given IV fluids. Cultures obtained and started on broad spectrum abx. Lactic acid >9 with metabolic acidosis on vbg. UA and CXR unremarkable. CT head no evidence of facial fracture or c-spine injury. CT chest no acute findings or evidence of pneumonia. PCCM consulted for shock and possible icu needs.  Pertinent ED labs: wbc 8.5, hgb 14.4, Glucose 234, creat 1.53, AG 16  Pertinent  Medical History   Past Medical History:  Diagnosis Date   Acquired hammer toe 12/06/2020   Allergic rhinitis 02/24/2019   Ambulatory dysfunction 04/23/2020   Bipolar 1 disorder 01/23/2015   with GAD, benzo dependence   BPPV (benign paroxysmal positional vertigo) 05/20/2019   CAD (coronary artery disease)    Nonobstructive; Managed by Dr. 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   Pulmonary hypertension    RLS (restless legs syndrome) 04/27/2015   Tremor, essential 12/11/2021   Type II diabetes mellitus 09/24/2020     Significant Hospital Events: Including procedures, antibiotic start and stop dates in addition to other pertinent events   4/1 admitted to [facility] after fall/syncope; hypotensive improving w/ iv fluids  Interim History / Subjective:  Map 77 Getting IV fluids Complaining of pain on right side of chest  Objective   Blood pressure (!) 89/54, pulse 74, temperature 98.1 F (36.7 C), temperature source Oral, resp. rate (!) 21, weight 83.5 kg, SpO2 98 %.        Intake/Output Summary (Last 24 hours) at 09/01/2022 1756 Last data filed at 09/01/2022 1349 Gross per 24 hour  Intake 4100.19 ml  Output --  Net 4100.19 ml   Filed Weights   09/01/22 0926  Weight: 83.5 kg    Examination: General: ill appearing female HEENT: MM pink/dry; [place] in place Neuro: Alert able to state name and place; MAE but in pain CV: s1s2, afib rate 80s, no m/r/g PULM:  dim BS bilaterally; Power 2L Extremities: warm/dry, no edema  Skin: no rashes or lesions    Resolved Hospital Problem list     Assessment & Plan:  Severe sepsis of unclear source Lactic  acidosis Acute metabolic/respiratory acidosis Acute kidney injury likely due to ischemic ATN Diabetes type 2 with hyperglycemia Chronic A-fib on anticoagulation Chronic HFpEF Bipolar disorder Frequent falls  Patient looks clinically dry Continue aggressive IV fluid therapy Her blood pressure has been stable Lactate is trending down Continue broad-spectrum antibiotics F/u cx Trend ABGs Avoid nephrotoxic agents Continue insulin with CBG goal 140-180 Her last hemoglobin A1c 6.8 Currently she is in A-fib with controlled rate Continue anticoagulation for stroke prophylaxis Monitor intake and output Hold antipsychotic for now Continue fall precaution PT/OT evaluation  At this time patient does not need ICU level of care, please call with questions  Best Practice (right click and "Reselect all SmartList Selections" daily)   Per primary  Labs   CBC: Recent Labs  Lab 09/01/22 1023 09/01/22 1501  WBC 8.5  --   NEUTROABS 5.3  --   HGB 14.4 12.2  HCT 42.9 36.0  MCV 87.9  --   PLT 157  --     Basic Metabolic Panel: Recent Labs  Lab 09/01/22 1023 09/01/22 1501  NA 137 136  K 3.8 4.8  CL 98  --   CO2 23  --   GLUCOSE 234*  --   BUN 22  --   CREATININE 1.53*  --   CALCIUM 9.4  --    GFR: Estimated Creatinine Clearance: 31.8 mL/min (A) (by C-G formula based on SCr of 1.53 mg/dL (H)). Recent Labs  Lab 09/01/22 1023 09/01/22 1205 09/01/22 1419  WBC 8.5  --   --   LATICACIDVEN 2.2* >9.0* 2.3*    Liver Function Tests: Recent Labs  Lab 09/01/22 1023  AST 22  ALT 10  ALKPHOS 55  BILITOT 1.1  PROT 7.7  ALBUMIN 4.1   No results for input(s): "LIPASE", "AMYLASE" in the last 168 hours. No results for input(s): "AMMONIA" in the last 168 hours.  ABG    Component Value Date/Time   PHART 7.44 07/11/2022 1320   PCO2ART 43 07/11/2022 1320   PO2ART 64 (L) 07/11/2022 1320   HCO3 34.5 (H) 09/01/2022 1501   TCO2 36 (H) 09/01/2022 1501   ACIDBASEDEF 3.0 (H)  06/08/2022 0851   O2SAT 25 09/01/2022 1501     Coagulation Profile: Recent Labs  Lab 09/01/22 1023  INR 1.4*    Cardiac Enzymes: No results for input(s): "CKTOTAL", "CKMB", "CKMBINDEX", "TROPONINI" in the last 168 hours.  HbA1C: Hemoglobin A1C  Date/Time Value Ref Range Status  05/28/2022 01:51 PM 6.8 (A) 4.0 - 5.6 % Final  11/22/2021 01:45 PM 7.3 (A) 4.0 - 5.6 % Final   HB A1C (BAYER DCA - WAIVED)  Date/Time Value Ref Range Status  04/03/2021 02:39 PM 6.7 (H) 4.8 - 5.6 % Final    Comment:             Prediabetes: 5.7 - 6.4          Diabetes: >6.4          Glycemic control for adults with diabetes: <7.0   11/14/2020 03:54 PM 6.8 <7.0 % Final  Comment:                                          Diabetic Adult            <7.0                                       Healthy Adult        4.3 - 5.7                                                           (DCCT/NGSP) American Diabetes Association's Summary of Glycemic Recommendations for Adults with Diabetes: Hemoglobin A1c <7.0%. More stringent glycemic goals (A1c <6.0%) may further reduce complications at the cost of increased risk of hypoglycemia.    Hgb A1c MFr Bld  Date/Time Value Ref Range Status  12/13/2020 05:35 PM 6.8 (H) 4.8 - 5.6 % Final    Comment:    (NOTE) Pre diabetes:          5.7%-6.4%  Diabetes:              >6.4%  Glycemic control for   <7.0% adults with diabetes     CBG: No results for input(s): "GLUCAP" in the last 168 hours.  Review of Systems:   12 point review of system is significant for complaint mentioned HPI, rest is negative  Past Medical History:  She,  has a past medical history of Acquired hammer toe (12/06/2020), Allergic rhinitis (02/24/2019), Ambulatory dysfunction (04/23/2020), Bipolar 1 disorder (01/23/2015), BPPV (benign paroxysmal positional vertigo) (05/20/2019), CAD (coronary artery disease), Chronic constipation (04/25/2020), Chronic diastolic heart failure (123456),  Chronic pain syndrome (08/22/2019), Chronic post-traumatic stress disorder (PTSD) (12/06/2020), Diabetic neuropathy (02/06/2016), Dyslipidemia (09/24/2020), Essential hypertension, Fibromyalgia (03/19/2017), Functional diarrhea (10/26/2020), Head trauma (09/17/2020), Hemorrhoids (03/11/2022), Herpes genitalis in women (07/16/2015), Insomnia (01/23/2015), Migraine headache with aura (02/12/2016), Mild intermittent asthma (01/12/2018), Myofascial pain dysfunction syndrome (A999333), Non-alcoholic fatty liver disease (01/12/2018), Opioid dependence (12/06/2020), OSA (obstructive sleep apnea) (02/24/2019), Osteopenia (12/06/2020), Pulmonary hypertension, RLS (restless legs syndrome) (04/27/2015), Tremor, essential (12/11/2021), and Type II diabetes mellitus (09/24/2020).   Surgical History:   Past Surgical History:  Procedure Laterality Date   BREAST REDUCTION SURGERY     EYE SURGERY Right    cateracts   HAMMER TOE SURGERY     LEFT HEART CATH AND CORONARY ANGIOGRAPHY N/A 02/03/2018   Procedure: LEFT HEART CATH AND CORONARY ANGIOGRAPHY;  Surgeon: Martinique, Peter M, MD;  Location: [facility];  Service: Cardiovascular;  Laterality: N/A;   REVERSE SHOULDER ARTHROPLASTY Right 08/19/2019   Procedure: REVERSE SHOULDER ARTHROPLASTY;  Surgeon: Netta Cedars, MD;  Location: [facility];  Service: Orthopedics;  Laterality: Right;  interscalene block   SHOULDER SURGERY Right    "I BROKE MY SHOUDLER   THIGH SURGERY     "TO REMOVE A TUMOR "     Social History:   reports that she has never smoked. She has never used smokeless tobacco. She reports that she does not currently use alcohol. She reports that she does not use drugs.   Family History:  Her family  history includes Alcohol abuse in her sister; Alzheimer's disease in her father; Diabetes in her brother, mother, and sister; Heart disease in her brother, mother, and sister; Mental illness in her brother; Stroke in her brother.   Allergies Allergies   Allergen Reactions   Iodine Anaphylaxis   Ivp Dye [Iodinated Contrast Media] Anaphylaxis and Swelling    Throat closes    Latex Other (See Comments)    Latex tape pulls skin with it   Tizanidine Other (See Comments)    Weakness, goofy, bad dreams     Home Medications  Prior to Admission medications   Medication Sig Start Date End Date Taking? Authorizing Provider  Acetaminophen (TYLENOL PO) Take 2 tablets by mouth as needed (pain/headache).    [provider]  albuterol (PROVENTIL) (2.5 MG/3ML) 0.083% nebulizer solution Take 3 mLs (2.5 mg total) by nebulization every 6 (six) hours as needed for wheezing or shortness of breath. Patient taking differently: Take 2.5 mg by nebulization as needed for wheezing or shortness of breath. 08/09/20   Gerlene Fee, NP  albuterol (VENTOLIN HFA) 108 (90 Base) MCG/ACT inhaler Inhale 2 puffs into the lungs every 6 (six) hours as needed for wheezing or shortness of breath. Patient taking differently: Inhale 2 puffs into the lungs as needed for wheezing or shortness of breath. 08/09/20   Gerlene Fee, NP  apixaban (ELIQUIS) 5 MG TABS tablet TAKE 1 TABLET TWICE A DAY 07/29/22   [name], MD  ARIPiprazole (ABILIFY) 2 MG tablet Take 2 mg by mouth at bedtime. 10/07/21   [provider]  atorvastatin (LIPITOR) 40 MG tablet TAKE 1 TABLET(40 MG) BY MOUTH DAILY (NEEDS TO BE SEEN BEFORE NEXT REFILL) 07/14/22   [name], MD  benztropine (COGENTIN) 1 MG tablet TAKE 1 TABLET(1 MG) BY MOUTH TWICE DAILY 08/28/22   [name], MD  colchicine 0.6 MG tablet Take 1 tablet (0.6 mg total) by mouth 2 (two) times daily as needed. 07/17/22   Dettinger, Fransisca Kaufmann, MD  Continuous Blood Gluc Sensor (DEXCOM G7 SENSOR) MISC Change sensor every 10 days 05/20/22   Shamleffer, Melanie Crazier, MD  Continuous Blood Gluc Transmit (DEXCOM G6 TRANSMITTER) MISC Use as instructed to check blood sugar. Change every 90 days 05/12/22   Shamleffer, Melanie Crazier,  MD  denosumab (PROLIA) 60 MG/ML SOSY injection Inject 60 mg into the skin every 6 (six) months. 08/14/20   [name], MD  escitalopram (LEXAPRO) 10 MG tablet Take 10 mg by mouth daily. 10/13/21   [provider]  fluconazole (DIFLUCAN) 150 MG tablet Take 1 now and can repeat 1 in 3 days 06/19/22   Estill Dooms, NP  Fluticasone-Salmeterol (ADVAIR DISKUS) 500-50 MCG/DOSE AEPB Inhale 1 puff into the lungs in the morning and at bedtime. Patient taking differently: Inhale 1 puff into the lungs as needed (SOB). 08/09/20   Gerlene Fee, NP  furosemide (LASIX) 40 MG tablet Take 0.5 tablets (20 mg total) by mouth 2 (two) times daily. Take extra 20mg  in am if dyspnea, weight gain of 3 lbs or greater, or edema. 06/29/22 08/25/22  Manuella Ghazi, Pratik D, DO  Galcanezumab-gnlm [facility]) 120 MG/ML SOAJ INJECT CONTENTS OF 1 PEN INTO THE SKIN ONCE MONTHLY 08/11/22   [name] E, PA-C  glucose blood (FREESTYLE LITE) test strip Check blood sugar 2-3 weekly 08/04/22   Shamleffer, Melanie Crazier, MD  hydrOXYzine (VISTARIL) 25 MG capsule Take 1 capsule (25 mg total) by mouth every 8 (eight) hours as needed. 03/31/22  Hawks, Christy A, FNP  icosapent Ethyl (VASCEPA) 1 g capsule TAKE 2 CAPSULES TWICE A DAY WITH MEALS 08/20/22   [name], MD  insulin glargine (LANTUS SOLOSTAR) 100 UNIT/ML Solostar Pen Inject 68 Units into the skin daily. Patient taking differently: Inject 34 Units into the skin 2 (two) times daily. 06/09/22   Shamleffer, Melanie Crazier, MD  insulin lispro (HUMALOG KWIKPEN) 100 UNIT/ML KwikPen Max daily 50 units Patient taking differently: Inject 4-8 Units into the skin 3 (three) times daily. 05/28/22   Shamleffer, Melanie Crazier, MD  Insulin Pen Needle 29G X 12MM MISC 1 Device by Does not apply route daily in the afternoon. 05/28/22   Shamleffer, Melanie Crazier, MD  lisinopril (ZESTRIL) 10 MG tablet Take 1 tablet (10 mg total) by mouth daily. 01/14/22   [name], MD  LORazepam  (ATIVAN) 1 MG tablet Take 1 tablet (1 mg total) by mouth 2 (two) times daily. And give 2 tablets by mouth at bedtime Patient taking differently: Take 1-2 mg by mouth 2 (two) times daily. Take 2 mg by mouth in the morning and 1 mg by mouth at bedtime 08/09/20   Gerlene Fee, NP  metFORMIN (GLUCOPHAGE) 500 MG tablet Take 1 tablet (500 mg total) by mouth 2 (two) times daily with a meal. TAKE 1 TABLET(500 MG) BY MOUTH TWICE DAILY AFTER A MEAL Patient taking differently: Take 500 mg by mouth 2 (two) times daily with a meal. 05/28/22   Shamleffer, Melanie Crazier, MD  metoprolol succinate (TOPROL XL) 25 MG 24 hr tablet Take 1 tablet (25 mg total) by mouth in the morning and at bedtime. Patient taking differently: Take 25 mg by mouth in the morning and at bedtime. Take with 100 mg tablet twice daily 11/13/21   Sueanne Margarita, MD  metoprolol succinate (TOPROL-XL) 100 MG 24 hr tablet TAKE 1 TABLET IN THE MORNING AND AT BEDTIME Patient taking differently: Take 100 mg by mouth in the morning and at bedtime. Take with 25 mg tablet twice daily. 11/13/21   Sueanne Margarita, MD  morphine (MS CONTIN) 15 MG 12 hr tablet Take 1 tablet (15 mg total) by mouth every 12 (twelve) hours. This is a long acting form, so take 1 tab 2x/day to keep pain more even- it is NOT a short acting pain med like you were taking- for chronic pain 08/19/22   Lovorn, Jinny Blossom, MD  naloxone [facility]) nasal spray 4 mg/0.1 mL Place 1 spray into the nose once. 08/08/21   [provider]  nystatin (MYCOSTATIN/NYSTOP) powder Apply 1 Application topically as needed (rash). 06/16/22   Shamleffer, Melanie Crazier, MD  nystatin ointment (MYCOSTATIN) Apply 1 Application topically 2 (two) times daily. Patient taking differently: Apply 1 Application topically as needed (rash). 12/27/21   Estill Dooms, NP  ondansetron (ZOFRAN-ODT) 8 MG disintegrating tablet Take 1 tablet (8 mg total) by mouth every 6 (six) hours as needed for nausea or vomiting.  01/13/22   [name], MD  OXcarbazepine (TRILEPTAL) 150 MG tablet Take 1 tablet (150 mg total) by mouth 2 (two) times daily. 06/13/22   Raiford Noble Latif, DO  potassium chloride SA (KLOR-CON M) 20 MEQ tablet Take 1 tablet (20 mEq total) by mouth daily. Patient taking differently: Take 10 mEq by mouth at bedtime. 01/27/22   [name], MD  pregabalin (LYRICA) 200 MG capsule TAKE 1 CAPSULE(200 MG) BY MOUTH TWICE DAILY Patient taking differently: Take 200 mg by mouth 2 (two) times daily. 03/27/22   Lovorn,  Jinny Blossom, MD  senna-docusate (SENOKOT-S) 8.6-50 MG tablet Take 1 tablet by mouth at bedtime. 06/13/22   Sheikh, Omair Latif, DO  tirzepatide [facility]) 5 MG/0.5ML Pen Inject 5 mg into the skin once a week. 08/19/22   Shamleffer, Melanie Crazier, MD  Vitamin D, Cholecalciferol, 25 MCG (1000 UT) TABS Take 1,000 Units by mouth daily in the afternoon. 07/26/20   [provider]     Critical care time:     This patient is critically ill with multiple organ system failure which requires frequent high complexity decision making, assessment, support, evaluation, and titration of therapies. This was completed through the application of advanced monitoring technologies and extensive interpretation of multiple databases.  During this encounter critical care time was devoted to patient care services described in this note for 39 minutes.    Jacky Kindle, MD [facility] See Amion for pager If no response to pager, please call [phone] until 7pm After 7pm, Please call E-link [phone]

## 2022-09-01 NOTE — ED Notes (Signed)
Cardizem and Fent being held until BP improves... Provider aware... Pharm contacted and also agreed.Marland KitchenMarland Kitchen

## 2022-09-01 NOTE — Sepsis Progress Note (Signed)
Sepsis protocol is being followed by eLink. 

## 2022-09-02 ENCOUNTER — Ambulatory Visit (HOSPITAL_COMMUNITY): Payer: Medicare Other

## 2022-09-02 ENCOUNTER — Observation Stay (HOSPITAL_COMMUNITY): Payer: Medicare Other

## 2022-09-02 DIAGNOSIS — Z794 Long term (current) use of insulin: Secondary | ICD-10-CM | POA: Diagnosis not present

## 2022-09-02 DIAGNOSIS — E669 Obesity, unspecified: Secondary | ICD-10-CM | POA: Diagnosis present

## 2022-09-02 DIAGNOSIS — K760 Fatty (change of) liver, not elsewhere classified: Secondary | ICD-10-CM | POA: Diagnosis present

## 2022-09-02 DIAGNOSIS — R55 Syncope and collapse: Secondary | ICD-10-CM

## 2022-09-02 DIAGNOSIS — J4520 Mild intermittent asthma, uncomplicated: Secondary | ICD-10-CM | POA: Diagnosis present

## 2022-09-02 DIAGNOSIS — E785 Hyperlipidemia, unspecified: Secondary | ICD-10-CM | POA: Diagnosis present

## 2022-09-02 DIAGNOSIS — N179 Acute kidney failure, unspecified: Secondary | ICD-10-CM

## 2022-09-02 DIAGNOSIS — R4182 Altered mental status, unspecified: Secondary | ICD-10-CM

## 2022-09-02 DIAGNOSIS — E8729 Other acidosis: Secondary | ICD-10-CM | POA: Diagnosis present

## 2022-09-02 DIAGNOSIS — I110 Hypertensive heart disease with heart failure: Secondary | ICD-10-CM | POA: Diagnosis present

## 2022-09-02 DIAGNOSIS — Z79899 Other long term (current) drug therapy: Secondary | ICD-10-CM | POA: Diagnosis not present

## 2022-09-02 DIAGNOSIS — G2581 Restless legs syndrome: Secondary | ICD-10-CM | POA: Diagnosis present

## 2022-09-02 DIAGNOSIS — I5032 Chronic diastolic (congestive) heart failure: Secondary | ICD-10-CM | POA: Diagnosis present

## 2022-09-02 DIAGNOSIS — H8113 Benign paroxysmal vertigo, bilateral: Secondary | ICD-10-CM | POA: Diagnosis not present

## 2022-09-02 DIAGNOSIS — I272 Pulmonary hypertension, unspecified: Secondary | ICD-10-CM | POA: Diagnosis present

## 2022-09-02 DIAGNOSIS — K7581 Nonalcoholic steatohepatitis (NASH): Secondary | ICD-10-CM | POA: Diagnosis present

## 2022-09-02 DIAGNOSIS — G9341 Metabolic encephalopathy: Secondary | ICD-10-CM | POA: Diagnosis not present

## 2022-09-02 DIAGNOSIS — S301XXA Contusion of abdominal wall, initial encounter: Secondary | ICD-10-CM | POA: Diagnosis present

## 2022-09-02 DIAGNOSIS — E1165 Type 2 diabetes mellitus with hyperglycemia: Secondary | ICD-10-CM | POA: Diagnosis present

## 2022-09-02 DIAGNOSIS — N170 Acute kidney failure with tubular necrosis: Secondary | ICD-10-CM | POA: Diagnosis present

## 2022-09-02 DIAGNOSIS — E860 Dehydration: Secondary | ICD-10-CM | POA: Diagnosis present

## 2022-09-02 DIAGNOSIS — I482 Chronic atrial fibrillation, unspecified: Secondary | ICD-10-CM | POA: Diagnosis present

## 2022-09-02 DIAGNOSIS — I10 Essential (primary) hypertension: Secondary | ICD-10-CM | POA: Diagnosis not present

## 2022-09-02 DIAGNOSIS — W19XXXA Unspecified fall, initial encounter: Secondary | ICD-10-CM | POA: Diagnosis present

## 2022-09-02 DIAGNOSIS — J309 Allergic rhinitis, unspecified: Secondary | ICD-10-CM | POA: Diagnosis present

## 2022-09-02 DIAGNOSIS — R296 Repeated falls: Secondary | ICD-10-CM | POA: Diagnosis not present

## 2022-09-02 DIAGNOSIS — I959 Hypotension, unspecified: Secondary | ICD-10-CM | POA: Diagnosis present

## 2022-09-02 DIAGNOSIS — R579 Shock, unspecified: Secondary | ICD-10-CM | POA: Diagnosis present

## 2022-09-02 DIAGNOSIS — G894 Chronic pain syndrome: Secondary | ICD-10-CM | POA: Diagnosis present

## 2022-09-02 DIAGNOSIS — I2510 Atherosclerotic heart disease of native coronary artery without angina pectoris: Secondary | ICD-10-CM | POA: Diagnosis not present

## 2022-09-02 DIAGNOSIS — M797 Fibromyalgia: Secondary | ICD-10-CM | POA: Diagnosis present

## 2022-09-02 DIAGNOSIS — F319 Bipolar disorder, unspecified: Secondary | ICD-10-CM | POA: Diagnosis present

## 2022-09-02 DIAGNOSIS — G928 Other toxic encephalopathy: Secondary | ICD-10-CM | POA: Diagnosis present

## 2022-09-02 DIAGNOSIS — E1140 Type 2 diabetes mellitus with diabetic neuropathy, unspecified: Secondary | ICD-10-CM | POA: Diagnosis present

## 2022-09-02 LAB — COMPREHENSIVE METABOLIC PANEL
ALT: 13 U/L (ref 0–44)
AST: 27 U/L (ref 15–41)
Albumin: 2.5 g/dL — ABNORMAL LOW (ref 3.5–5.0)
Alkaline Phosphatase: 42 U/L (ref 38–126)
Anion gap: 8 (ref 5–15)
BUN: 11 mg/dL (ref 8–23)
CO2: 27 mmol/L (ref 22–32)
Calcium: 8 mg/dL — ABNORMAL LOW (ref 8.9–10.3)
Chloride: 102 mmol/L (ref 98–111)
Creatinine, Ser: 0.92 mg/dL (ref 0.44–1.00)
GFR, Estimated: >60 mL/min (ref >60)
Glucose, Bld: 134 mg/dL — ABNORMAL HIGH (ref 70–99)
Potassium: 3.3 mmol/L — ABNORMAL LOW (ref 3.5–5.1)
Sodium: 137 mmol/L (ref 135–145)
Total Bilirubin: 0.8 mg/dL (ref 0.3–1.2)
Total Protein: 5.5 g/dL — ABNORMAL LOW (ref 6.5–8.1)

## 2022-09-02 LAB — ECHOCARDIOGRAM LIMITED
S' Lateral: 2.1 cm
Weight: 2944 oz

## 2022-09-02 LAB — CBC
HCT: 35 % — ABNORMAL LOW (ref 36.0–46.0)
Hemoglobin: 11.8 g/dL — ABNORMAL LOW (ref 12.0–15.0)
MCH: 29.9 pg (ref 26.0–34.0)
MCHC: 33.7 g/dL (ref 30.0–36.0)
MCV: 88.6 fL (ref 80.0–100.0)
Platelets: 122 10*3/uL — ABNORMAL LOW (ref 150–400)
RBC: 3.95 MIL/uL (ref 3.87–5.11)
RDW: 13.8 % (ref 11.5–15.5)
WBC: 4.7 10*3/uL (ref 4.0–10.5)
nRBC: 0 % (ref 0.0–0.2)

## 2022-09-02 LAB — BLOOD GAS, ARTERIAL
Acid-Base Excess: 3.7 mmol/L — ABNORMAL HIGH (ref 0.0–2.0)
Bicarbonate: 29.7 mmol/L — ABNORMAL HIGH (ref 20.0–28.0)
Drawn by: 67510
O2 Saturation: 100 %
Patient temperature: 37.2
pCO2 arterial: 49 mmHg — ABNORMAL HIGH (ref 32–48)
pH, Arterial: 7.39 (ref 7.35–7.45)
pO2, Arterial: 93 mmHg (ref 83–108)

## 2022-09-02 LAB — GLUCOSE, CAPILLARY
Glucose-Capillary: 178 mg/dL — ABNORMAL HIGH (ref 70–99)
Glucose-Capillary: 175 mg/dL — ABNORMAL HIGH (ref 70–99)
Glucose-Capillary: 160 mg/dL — ABNORMAL HIGH (ref 70–99)

## 2022-09-02 MED ORDER — PREGABALIN 100 MG PO CAPS
100.0000 mg | ORAL_CAPSULE | Freq: Two times a day (BID) | ORAL | Status: DC
  Administered 2022-09-02 – 2022-09-04 (×4): 100 mg via ORAL
  Filled 2022-09-02 (×5): qty 1

## 2022-09-02 MED ORDER — INSULIN ASPART 100 UNIT/ML IJ SOLN
0.0000 [IU] | Freq: Three times a day (TID) | INTRAMUSCULAR | Status: DC
  Administered 2022-09-02 – 2022-09-03 (×3): 2 [IU] via SUBCUTANEOUS
  Administered 2022-09-03: 3 [IU] via SUBCUTANEOUS
  Administered 2022-09-03: 5 [IU] via SUBCUTANEOUS
  Administered 2022-09-04: 3 [IU] via SUBCUTANEOUS

## 2022-09-02 MED ORDER — CYANOCOBALAMIN 1000 MCG/ML IJ SOLN
1000.0000 ug | Freq: Every day | INTRAMUSCULAR | Status: DC
  Administered 2022-09-02 – 2022-09-04 (×3): 1000 ug via SUBCUTANEOUS
  Filled 2022-09-02 (×3): qty 1

## 2022-09-02 MED ORDER — POTASSIUM CHLORIDE CRYS ER 20 MEQ PO TBCR
40.0000 meq | EXTENDED_RELEASE_TABLET | ORAL | Status: AC
  Administered 2022-09-02 – 2022-09-03 (×2): 40 meq via ORAL
  Filled 2022-09-02 (×2): qty 2

## 2022-09-02 MED ORDER — DIGOXIN 0.25 MG/ML IJ SOLN
0.2500 mg | Freq: Once | INTRAMUSCULAR | Status: DC
  Filled 2022-09-02: qty 1

## 2022-09-02 NOTE — Procedures (Signed)
Patient Name: Amber Stephenson  MRN: [id number]  Epilepsy Attending: Lora Havens  Referring Physician/Provider: Antonieta Pert, MD  Date: 09/01/2022 Duration: 21.48 mins  Patient history: 77yo F with ams. EEG to evaluate for seizure.  Level of alertness: Awake  AEDs during EEG study: None  Technical aspects: This EEG study was done with scalp electrodes positioned according to the 10-20 International system of electrode placement. Electrical activity was reviewed with band pass filter of 1-70Hz , sensitivity of 7 uV/mm, display speed of 3mm/sec with a 60Hz  notched filter applied as appropriate. EEG data were recorded continuously and digitally stored.  Video monitoring was available and reviewed as appropriate.  Description: The posterior dominant rhythm consists of 6 Hz activity of moderate voltage (25-35 uV) seen predominantly in posterior head regions, symmetric and reactive to eye opening and eye closing. EEG showed continuous generalized 3 to 6 Hz theta-delta slowing. Hyperventilation and photic stimulation were not performed.     ABNORMALITY - Continuous slow, generalized  IMPRESSION: This study is suggestive of moderate diffuse encephalopathy, nonspecific etiology. No seizures or epileptiform discharges were seen throughout the recording.  Artin Mceuen Barbra Sarks

## 2022-09-02 NOTE — Progress Notes (Addendum)
  X-cover Note: Secure chat sent by RN. Pt tachycardic with intermittent rapid afib. Chart reviewed. Appears that betablockers hold on admission due to hypotension. HR 127. BP 115/65. Scr down to 0.92. will give 1 dose IV digoxin 0.25 mg  K was 3.3 this morning. Will give 40 meq kcl q4h x 2 doses Kristopher Oppenheim, DO [facility]

## 2022-09-02 NOTE — Evaluation (Addendum)
Physical Therapy Evaluation Patient Details Name: Amber Stephenson MRN: [id number] DOB: 11/11/1945 Today's Date: 09/02/2022  History of Present Illness  Patient is a 77 yo female who presents to [facility] on 4/1 with fall/syncope. Found to have Acute toxic metabolic encephalopathy, hypotension and likely polypharmacy. PMHx includes HTN, BPPV, CAD nonobstructive, CHF, chronic pain, PTSD, DM, nonalcoholic fatty liver disease, OSA using nocturnal oxygen/pulmonary hypertension, RLS, tremor.  Clinical Impression  Patient presents with pain, generalized weakness, impaired balance, impaired cognition and impaired mobility s/p above. Pt lives at home with her spouse and reports being Mod I for ADLs and walking (furniture walker) at baseline with spouse doing most IADLs and driving. Does endorse some falls that spouse attributes to weakness/LOB in tight spaces.  Today, pt requires Min A for standing and gait training with use of IV pole for HHA for support. RW will not fit inside home so not an option for mobility. Will trial [facility] next session? Encouraged walking to bathroom with staff and to increase activity. Hoping pt's mobility will improve with increased activity. Will have support of spouse at home and recommending HHPT to maximize independence and mobility/balance. Will follow acutely.     Recommendations for follow up therapy are one component of a multi-disciplinary discharge planning process, led by the attending physician.  Recommendations may be updated based on patient status, additional functional criteria and insurance authorization.  Follow Up Recommendations       Assistance Recommended at Discharge Frequent or constant Supervision/Assistance  Patient can return home with the following  A little help with walking and/or transfers;A little help with bathing/dressing/bathroom;Help with stairs or ramp for entrance;Assist for transportation;Assistance with cooking/housework    Equipment  Recommendations None recommended by PT  Recommendations for Other Services       Functional Status Assessment Patient has had a recent decline in their functional status and demonstrates the ability to make significant improvements in function in a reasonable and predictable amount of time.     Precautions / Restrictions Precautions Precautions: Fall;Other (comment) Precaution Comments: recent falls Restrictions Weight Bearing Restrictions: No      Mobility  Bed Mobility               General bed mobility comments: Up in chair upon PT arrival.    Transfers Overall transfer level: Needs assistance Equipment used: Rolling walker (2 wheels) Transfers: Sit to/from Stand Sit to Stand: Min assist           General transfer comment: Min A to power to standing with cues for hand placement/technique.    Ambulation/Gait   Gait Distance (Feet): 50 Feet Assistive device: 1 person hand held assist, IV Pole Gait Pattern/deviations: Step-through pattern, Decreased stride length, Wide base of support, Decreased dorsiflexion - right, Decreased dorsiflexion - left Gait velocity: decreased Gait velocity interpretation: <1.31 ft/sec, indicative of household ambulator   General Gait Details: Slow, guarded and unsteady gait with HHA and IV pole for support, decreased foot clearance bilaterally and wide BoS.  Stairs            Wheelchair Mobility    Modified Rankin (Stroke Patients Only)       Balance Overall balance assessment: Needs assistance Sitting-balance support: Feet supported, No upper extremity supported Sitting balance-Leahy Scale: Fair     Standing balance support: During functional activity Standing balance-Leahy Scale: Poor Standing balance comment: Requires UE support in standing.  Pertinent Vitals/Pain Pain Assessment Pain Assessment: Faces Faces Pain Scale: Hurts even more Pain Location: bottom, right  shoulder, bil feet Pain Descriptors / Indicators: Discomfort, Sore, Grimacing, Guarding Pain Intervention(s): Monitored during session, Repositioned, Limited activity within patient's tolerance    Home Living Family/patient expects to be discharged to:: Private residence Living Arrangements: Spouse/significant other Available Help at Discharge: Family;Available 24 hours/day Type of Home: House Home Access: Stairs to enter Entrance Stairs-Rails: Left Entrance Stairs-Number of Steps: 5 vs 3   Home Layout: One level Home Equipment: Conservation officer, nature (2 wheels);Cane - single point;Grab bars - tub/shower;Grab bars - toilet Additional Comments: pt gave conflicting information throughout    Prior Function Prior Level of Function : Needs assist             Mobility Comments: furniture walker at baseline, hx of falls, spouse manages medicine and finances and driving ADLs Comments: per report, pt's husband assisting with shower transfer, LB ADLs and IADLs, does not drive     Hand Dominance   Dominant Hand: Right    Extremity/Trunk Assessment   Upper Extremity Assessment Upper Extremity Assessment: Defer to OT evaluation;Generalized weakness    Lower Extremity Assessment Lower Extremity Assessment: Generalized weakness;RLE deficits/detail;LLE deficits/detail RLE Deficits / Details: hx of gout and painful LLE Deficits / Details: hx of gout and painful    Cervical / Trunk Assessment Cervical / Trunk Assessment: Normal  Communication   Communication: No difficulties  Cognition Arousal/Alertness: Awake/alert Behavior During Therapy: WFL for tasks assessed/performed Overall Cognitive Status: History of cognitive impairments - at baseline                                 General Comments: (P) Knows it is April, 2004, [facility]. Poor STM. Giving different answers than in OT session. Spouse reports pt is not at cognitive baseline but close. She has not "been acting right since  fall on 3/29."        General Comments General comments (skin integrity, edema, etc.): unreliable pleth during activity, changed location to finger and reading highs 90s at end of session. No SOB noted. spouse present during session.    Exercises     Assessment/Plan    PT Assessment Patient needs continued PT services  PT Problem List Decreased strength;Decreased mobility;Decreased cognition;Cardiopulmonary status limiting activity;Decreased skin integrity;Pain;Decreased balance;Decreased knowledge of use of DME;Decreased activity tolerance       PT Treatment Interventions Therapeutic activities;Gait training;Therapeutic exercise;Patient/family education;Balance training;Functional mobility training;Stair training;DME instruction;Cognitive remediation    PT Goals (Current goals can be found in the Care Plan section)  Acute Rehab PT Goals Patient Stated Goal: decrease pain, return to PLOF PT Goal Formulation: With patient/family Time For Goal Achievement: 09/16/22 Potential to Achieve Goals: Good    Frequency Min 3X/week     Co-evaluation               AM-PAC PT "6 Clicks" Mobility  Outcome Measure Help needed turning from your back to your side while in a flat bed without using bedrails?: A Little Help needed moving from lying on your back to sitting on the side of a flat bed without using bedrails?: A Little Help needed moving to and from a bed to a chair (including a wheelchair)?: A Little Help needed standing up from a chair using your arms (e.g., wheelchair or bedside chair)?: A Little Help needed to walk in hospital room?: A Little Help needed climbing 3-5  steps with a railing? : A Lot 6 Click Score: 17    End of Session Equipment Utilized During Treatment: Gait belt;Oxygen Activity Tolerance: Patient limited by pain Patient left: in chair;with call bell/phone within reach;with family/visitor present;with chair alarm set Nurse Communication: Mobility  status PT Visit Diagnosis: Pain Pain - Right/Left: Right Pain - part of body: Shoulder (bil feet, right hip)    Time: 1000-1029 PT Time Calculation (min) (ACUTE ONLY): 29 min   Charges:   PT Evaluation $PT Eval Moderate Complexity: 1 Mod PT Treatments $Gait Training: 8-22 mins        Zettie Cooley, DPT Acute Rehabilitation Services Secure chat preferred Office [place] 09/02/2022, 12:31 PM

## 2022-09-02 NOTE — Progress Notes (Addendum)
PROGRESS NOTE        PATIENT DETAILS Name: Amber Stephenson Age: 77 y.o. Sex: female Date of Birth: Feb 24, 1946 Admit Date: 09/01/2022 Admitting Physician Karmen Bongo, MD [id number], Cletus Gash, MD  Brief Summary: Patient is a 77 y.o.  female HTN, DM-2, COPD, A-fib, chronic HFpEF, chronic pain syndrome, restless leg syndrome, bipolar disorder who presented to the hospital with unsteady gait, frequent falls-was found to be hypotensive-there was concern for sepsis physiology-and hence patient was admitted to the hospitalist service.  See below for further details.  Significant events: 4/1>> admit to [facility]  Significant studies: 4/1>> CT head: No acute intracranial abnormality 4/1>> CT C-spine: No cervical spine injury 4/1>> CT maxillofacial: No facial fracture 4/1>> CT chest: No acute findings-no PNA 4/1>> abdominal ultrasound: No acute abnormality identified 4/2>> EEG: No seizures  Significant microbiology data: 4/1>> blood culture: Negative 4/1>> UA: No evidence of UTI  Procedures: None none  Consults: PCCM  Subjective: Lying comfortably in bed-denies any chest pain or shortness of breath.  Anxious-but answers most of my questions appropriately.  No family at bedside.  Objective: Vitals: Blood pressure 129/77, pulse 91, temperature 99.4 F (37.4 C), temperature source Oral, resp. rate 20, weight 83.5 kg, SpO2 100 %.   Exam: Gen Exam:Alert awake-not in any distress HEENT:atraumatic, normocephalic Chest: B/L clear to auscultation anteriorly CVS:S1S2 regular Abdomen:soft non tender, non distended Extremities:no edema Neurology: Non focal Skin: no rash  Pertinent Labs/Radiology:    Latest Ref Rng & Units 09/01/2022    3:01 PM 09/01/2022   10:23 AM 08/16/2022    7:16 PM  CBC  WBC 4.0 - 10.5 K/uL  8.5  10.8   Hemoglobin 12.0 - 15.0 g/dL 12.2  14.4  13.6   Hematocrit 36.0 - 46.0 % 36.0  42.9  41.0   Platelets 150 - 400 K/uL  157  226     Lab  Results  Component Value Date   NA 136 09/01/2022   K 4.8 09/01/2022   CL 98 09/01/2022   CO2 23 09/01/2022      Assessment/Plan: Hypotension Acute toxic metabolic encephalopathy Frequent falls Although sepsis may be possible-no leukocytosis/fever-cultures/imaging studies negative-suspect her presenting complaints are more likely due to polypharmacy/antihypertensive/diuretic use rather than sepsis. Stop all antibiotics-follow cultures-continue inpatient monitoring/observation Await PT/OT eval.  Polypharmacy Reviewed home medication list-on benzos/narcotics/psychotropic medications Currently awake/alert-no family at bedside but seems to have improved compared to admission Cautiously continue with current medications-will reassess 4/3 for further addition of her usual outpatient medications. She is on chronic narcotics/benzos-and these will need to be titrated down in the outpatient setting  AKI Mild Suspect hemodynamically mediated kidney injury Continue to avoid nephrotoxic agents Hydrated overnight with IVF-await labs this morning.  HTN Antihypertensives on hold Hypotensive on initial presentation-but BP this morning soft but stable  Chronic atrial fibrillation Rate controlled Beta-blocker held for hypotension Remains on Eliquis  Chronic HFpEF Euvolemic Holding lisinopril/beta-blocker/diuretics until blood pressure improves  HLD Statin  DM-2 Follow CBGs on Semglee 12 units twice daily/SSI Optimize over the next several days Continue to hold metformin/  No results for input(s): "GLUCAP" in the last 72 hours.   Vitamin B12 deficiency Begin supplementation  OSA intolerant to CPAP-on nocturnal home O2 Bronchial asthma Stable-continue bronchodilators/nocturnal O2 as needed  Chronic pain syndrome Chronic back pain Mentation has improved-on narcotics-watch closely.  Restless leg syndrome Bipolar disorder  Anxiety disorder Peripheral neuropathy  Continue to  Trileptal/Lexapro/Abilify/Cogentin On reduced dose lorazepam currently Since mentation has improved-should be able to resume Lyrica  Obesity: Estimated body mass index is 33.65 kg/m as calculated from the following:   Height as of 08/29/22: 5\' 2"  (1.575 m).   Weight as of this encounter: 83.5 kg.   Code status:   Code Status: Full Code   DVT Prophylaxis: Place TED hose Start: 09/01/22 1845 apixaban (ELIQUIS) tablet 5 mg    Family Communication:Spouse-Dwight-[phone]-Long discussion over the phone-advised that he follow with patient's neurologist/psychiatrist/PCP to see if we can start titrating down narcotics/benzos/psychotropic medications.   Disposition Plan: Status is: Observation The patient will require care spanning > 2 midnights and should be moved to inpatient because: Severity of illness   Planned Discharge Destination:Home health versus SNF   Diet: Diet Order             Diet heart healthy/carb modified Room service appropriate? Yes; Fluid consistency: Thin  Diet effective now                     Antimicrobial agents: Anti-infectives (From admission, onward)    Start     Dose/Rate Route Frequency Ordered Stop   09/03/22 1200  vancomycin (VANCOCIN) IVPB 1000 mg/200 mL premix        1,000 mg 200 mL/hr over 60 Minutes Intravenous Every 48 hours 09/01/22 1122     09/02/22 0000  ceFEPIme (MAXIPIME) 2 g in sodium chloride 0.9 % 100 mL IVPB        2 g 200 mL/hr over 30 Minutes Intravenous Every 12 hours 09/01/22 1122     09/01/22 2200  metroNIDAZOLE (FLAGYL) IVPB 500 mg        500 mg 100 mL/hr over 60 Minutes Intravenous Every 12 hours 09/01/22 1842     09/01/22 1230  vancomycin (VANCOCIN) IVPB 1000 mg/200 mL premix        1,000 mg 200 mL/hr over 60 Minutes Intravenous  Once 09/01/22 1106 09/01/22 1349   09/01/22 1100  ceFEPIme (MAXIPIME) 2 g in sodium chloride 0.9 % 100 mL IVPB        2 g 200 mL/hr over 30 Minutes Intravenous  Once 09/01/22 1058  09/01/22 1209   09/01/22 1100  metroNIDAZOLE (FLAGYL) IVPB 500 mg        500 mg 100 mL/hr over 60 Minutes Intravenous  Once 09/01/22 1058 09/01/22 1325   09/01/22 1100  vancomycin (VANCOCIN) IVPB 1000 mg/200 mL premix        1,000 mg 200 mL/hr over 60 Minutes Intravenous  Once 09/01/22 1058 09/01/22 1325        MEDICATIONS: Scheduled Meds:  apixaban  5 mg Oral BID   ARIPiprazole  2 mg Oral QHS   benztropine  1 mg Oral BID   cyanocobalamin  1,000 mcg Subcutaneous Daily   escitalopram  10 mg Oral Daily   insulin glargine-yfgn  12 Units Subcutaneous BID   LORazepam  0.25 mg Oral BID   mometasone-formoterol  2 puff Inhalation BID   morphine  15 mg Oral Q12H   OXcarbazepine  150 mg Oral BID   Continuous Infusions:  ceFEPime (MAXIPIME) IV 2 g (09/02/22 0057)   lactated ringers 125 mL/hr at 09/02/22 0059   metronidazole 500 mg (09/01/22 2242)   [START ON 09/03/2022] vancomycin     PRN Meds:.acetaminophen **OR** acetaminophen, albuterol, ondansetron **OR** ondansetron (ZOFRAN) IV   I have personally reviewed following labs and imaging  studies  LABORATORY DATA: CBC: Recent Labs  Lab 09/01/22 1023 09/01/22 1501  WBC 8.5  --   NEUTROABS 5.3  --   HGB 14.4 12.2  HCT 42.9 36.0  MCV 87.9  --   PLT 157  --     Basic Metabolic Panel: Recent Labs  Lab 09/01/22 1023 09/01/22 1501  NA 137 136  K 3.8 4.8  CL 98  --   CO2 23  --   GLUCOSE 234*  --   BUN 22  --   CREATININE 1.53*  --   CALCIUM 9.4  --     GFR: Estimated Creatinine Clearance: 31.8 mL/min (A) (by C-G formula based on SCr of 1.53 mg/dL (H)).  Liver Function Tests: Recent Labs  Lab 09/01/22 1023  AST 22  ALT 10  ALKPHOS 55  BILITOT 1.1  PROT 7.7  ALBUMIN 4.1   No results for input(s): "LIPASE", "AMYLASE" in the last 168 hours. Recent Labs  Lab 09/01/22 1857  AMMONIA 16    Coagulation Profile: Recent Labs  Lab 09/01/22 1023  INR 1.4*    Cardiac Enzymes: Recent Labs  Lab 09/01/22 1857   CKTOTAL 387*    BNP (last 3 results) No results for input(s): "PROBNP" in the last 8760 hours.  Lipid Profile: No results for input(s): "CHOL", "HDL", "LDLCALC", "TRIG", "CHOLHDL", "LDLDIRECT" in the last 72 hours.  Thyroid Function Tests: Recent Labs    09/01/22 1857  TSH 0.785    Anemia Panel: Recent Labs    09/01/22 1857  VITAMINB12 211    Urine analysis:    Component Value Date/Time   COLORURINE YELLOW 09/01/2022 1304   APPEARANCEUR CLEAR 09/01/2022 1304   APPEARANCEUR Clear 03/26/2022 0815   LABSPEC 1.009 09/01/2022 1304   PHURINE 6.5 09/01/2022 1304   GLUCOSEU NEGATIVE 09/01/2022 1304   HGBUR TRACE (A) 09/01/2022 1304   BILIRUBINUR NEGATIVE 09/01/2022 1304   BILIRUBINUR Negative 03/26/2022 0815   KETONESUR NEGATIVE 09/01/2022 1304   PROTEINUR TRACE (A) 09/01/2022 1304   UROBILINOGEN 0.2 07/23/2015 1350   NITRITE NEGATIVE 09/01/2022 1304   LEUKOCYTESUR TRACE (A) 09/01/2022 1304    Sepsis Labs: Lactic Acid, Venous    Component Value Date/Time   LATICACIDVEN 1.4 09/01/2022 2136    MICROBIOLOGY: Recent Results (from the past 240 hour(s))  Blood Culture (routine x 2)     Status: None (Preliminary result)   Collection Time: 09/01/22 10:23 AM   Specimen: BLOOD RIGHT ARM  Result Value Ref Range Status   Specimen Description   Final    BLOOD RIGHT ARM Performed at [facility], [address], [place], [place] [address]    Special Requests   Final    BOTTLES DRAWN AEROBIC AND ANAEROBIC Blood Culture adequate volume Performed at [facility], [address] [place], [place] [address]    Culture   Final    NO GROWTH < 24 HOURS Performed at [facility], [place] [address] [place], [place] [address]    Report Status PENDING  Incomplete    RADIOLOGY STUDIES/RESULTS: EEG adult  Result Date: 09/02/2022 Lora Havens, MD     09/02/2022  9:04 AM Patient Name: BRIGHTEN WHITTAKER MRN: [id number] Epilepsy  Attending: Lora Havens Referring Physician/Provider: Antonieta Pert, MD Date: 09/01/2022 Duration: 21.48 mins Patient history: 77yo F with ams. EEG to evaluate for seizure. Level of alertness: Awake AEDs during EEG study: None Technical aspects: This EEG study was done with scalp electrodes positioned according to  the 10-20 International system of electrode placement. Electrical activity was reviewed with band pass filter of 1-70Hz , sensitivity of 7 uV/mm, display speed of 54mm/sec with a 60Hz  notched filter applied as appropriate. EEG data were recorded continuously and digitally stored.  Video monitoring was available and reviewed as appropriate. Description: The posterior dominant rhythm consists of 6 Hz activity of moderate voltage (25-35 uV) seen predominantly in posterior head regions, symmetric and reactive to eye opening and eye closing. EEG showed continuous generalized 3 to 6 Hz theta-delta slowing. Hyperventilation and photic stimulation were not performed.   ABNORMALITY - Continuous slow, generalized IMPRESSION: This study is suggestive of moderate diffuse encephalopathy, nonspecific etiology. No seizures or epileptiform discharges were seen throughout the recording. Priyanka Barbra Sarks   US Abdomen Complete  Result Date: 09/01/2022 CLINICAL DATA:  Unspecified abdominal pain EXAM: ABDOMEN ULTRASOUND COMPLETE COMPARISON:  None Available. FINDINGS: Gallbladder: No gallstones or wall thickening visualized. No sonographic Murphy sign noted by sonographer. Common bile duct: Diameter: 2-3 mm in proximal diameter. Liver: Hepatic parenchymal echogenicity is diffusely increased and there is coarsening of the hepatic echotexture in keeping with mild hepatic steatosis. No focal intrahepatic masses are seen and there is no intrahepatic biliary ductal dilation. Portal vein is patent on color Doppler imaging with normal direction of blood flow towards the liver. IVC: No abnormality visualized. Pancreas: Obscured by  overlying bowel gas Spleen: Not visualized due to limited patient mobility Right Kidney: Length: 11.7 cm. Echogenicity within normal limits. No mass or hydronephrosis visualized. Left Kidney: Not visualized due to limited patient mobility Abdominal aorta: Obscured by overlying bowel gas Other findings: None. IMPRESSION: 1. Limited examination with nonvisualization of the spleen and left kidney. No acute abnormality identified. 2. Mild hepatic steatosis. Electronically Signed   By: Fidela Salisbury M.D.   On: 09/01/2022 19:52   CT Chest Wo Contrast  Result Date: 09/01/2022 CLINICAL DATA:  Patient found lying beside the bed yesterday afternoon. Abrasion to the right face and year. Multiple sites of pain including the right arm and chest. EXAM: CT CHEST WITHOUT CONTRAST TECHNIQUE: Multidetector CT imaging of the chest was performed following the standard protocol without IV contrast. RADIATION DOSE REDUCTION: This exam was performed according to the departmental dose-optimization program which includes automated exposure control, adjustment of the mA and/or kV according to patient size and/or use of iterative reconstruction technique. COMPARISON:  06/28/2021. FINDINGS: Cardiovascular: Heart normal in size. No pericardial effusion. Mitral valve annular calcifications. Mild left coronary artery and circumflex coronary artery calcifications. Great vessels are normal in caliber. Mild aortic atherosclerosis. Mediastinum/Nodes: No neck base, mediastinal or hilar masses. No enlarged lymph nodes. Trachea and esophagus are unremarkable. Lungs/Pleura: No lung consolidation. No evidence of edema. Interstitial thickening most evident in the peripheral right upper lobe and lung bases. There are reticulonodular opacities peripherally in the right upper lobe with numerous calcified granuloma. Additional small calcified granuloma are seen in all remaining lobes. Findings are similar to the prior CT. No pleural effusion or  pneumothorax. Upper Abdomen: Decreased attenuation of the liver consistent with fatty infiltration. Stable 9 mm left adrenal nodule consistent with an adenoma. No follow-up recommended. Musculoskeletal: No acute fracture or acute finding. No bone lesion. Partly imaged right shoulder reverse prosthesis appears well seated. IMPRESSION: 1. No acute findings.  No evidence of pneumonia. 2. Chronic lung findings as detailed stable from the prior CT. 3. Mild aortic atherosclerosis.  Coronary artery calcifications. 4. Hepatic steatosis. Aortic Atherosclerosis (ICD10-I70.0). Electronically Signed   By: Shanon Brow  Ormond M.D.   On: 09/01/2022 11:35   CT Head Wo Contrast  Result Date: 09/01/2022 CLINICAL DATA:  Blunt facial trauma EXAM: CT HEAD WITHOUT CONTRAST CT MAXILLOFACIAL WITHOUT CONTRAST CT CERVICAL SPINE WITHOUT CONTRAST TECHNIQUE: Multidetector CT imaging of the head, cervical spine, and maxillofacial structures were performed using the standard protocol without intravenous contrast. Multiplanar CT image reconstructions of the cervical spine and maxillofacial structures were also generated. RADIATION DOSE REDUCTION: This exam was performed according to the departmental dose-optimization program which includes automated exposure control, adjustment of the mA and/or kV according to patient size and/or use of iterative reconstruction technique. COMPARISON:  Head CT 08/16/2022 FINDINGS: CT HEAD FINDINGS Brain: No evidence of acute infarction, hemorrhage, hydrocephalus, extra-axial collection or mass lesion/mass effect. Vascular: No hyperdense vessel or unexpected calcification. Skull: Normal. Negative for fracture or focal lesion. CT MAXILLOFACIAL FINDINGS Osseous: No fracture or mandibular dislocation. No destructive process. Orbits: No evidence of injury Sinuses: Negative for hemosinus Soft tissues: No hematoma or foreign body seen. CT CERVICAL SPINE FINDINGS Alignment: No traumatic malalignment Skull base and vertebrae:  No acute fracture. No primary bone lesion or focal pathologic process. Soft tissues and spinal canal: No prevertebral fluid or swelling. No visible canal hematoma. Disc levels: Ordinary cervical spine degeneration with endplate and facet spurring. Upper chest: Negative IMPRESSION: No evidence of intracranial or cervical spine injury. Negative for facial fracture. Electronically Signed   By: Jorje Guild M.D.   On: 09/01/2022 11:30   CT Cervical Spine Wo Contrast  Result Date: 09/01/2022 CLINICAL DATA:  Blunt facial trauma EXAM: CT HEAD WITHOUT CONTRAST CT MAXILLOFACIAL WITHOUT CONTRAST CT CERVICAL SPINE WITHOUT CONTRAST TECHNIQUE: Multidetector CT imaging of the head, cervical spine, and maxillofacial structures were performed using the standard protocol without intravenous contrast. Multiplanar CT image reconstructions of the cervical spine and maxillofacial structures were also generated. RADIATION DOSE REDUCTION: This exam was performed according to the departmental dose-optimization program which includes automated exposure control, adjustment of the mA and/or kV according to patient size and/or use of iterative reconstruction technique. COMPARISON:  Head CT 08/16/2022 FINDINGS: CT HEAD FINDINGS Brain: No evidence of acute infarction, hemorrhage, hydrocephalus, extra-axial collection or mass lesion/mass effect. Vascular: No hyperdense vessel or unexpected calcification. Skull: Normal. Negative for fracture or focal lesion. CT MAXILLOFACIAL FINDINGS Osseous: No fracture or mandibular dislocation. No destructive process. Orbits: No evidence of injury Sinuses: Negative for hemosinus Soft tissues: No hematoma or foreign body seen. CT CERVICAL SPINE FINDINGS Alignment: No traumatic malalignment Skull base and vertebrae: No acute fracture. No primary bone lesion or focal pathologic process. Soft tissues and spinal canal: No prevertebral fluid or swelling. No visible canal hematoma. Disc levels: Ordinary cervical  spine degeneration with endplate and facet spurring. Upper chest: Negative IMPRESSION: No evidence of intracranial or cervical spine injury. Negative for facial fracture. Electronically Signed   By: Jorje Guild M.D.   On: 09/01/2022 11:30   CT Maxillofacial Wo Contrast  Result Date: 09/01/2022 CLINICAL DATA:  Blunt facial trauma EXAM: CT HEAD WITHOUT CONTRAST CT MAXILLOFACIAL WITHOUT CONTRAST CT CERVICAL SPINE WITHOUT CONTRAST TECHNIQUE: Multidetector CT imaging of the head, cervical spine, and maxillofacial structures were performed using the standard protocol without intravenous contrast. Multiplanar CT image reconstructions of the cervical spine and maxillofacial structures were also generated. RADIATION DOSE REDUCTION: This exam was performed according to the departmental dose-optimization program which includes automated exposure control, adjustment of the mA and/or kV according to patient size and/or use of iterative reconstruction technique. COMPARISON:  Head  CT 08/16/2022 FINDINGS: CT HEAD FINDINGS Brain: No evidence of acute infarction, hemorrhage, hydrocephalus, extra-axial collection or mass lesion/mass effect. Vascular: No hyperdense vessel or unexpected calcification. Skull: Normal. Negative for fracture or focal lesion. CT MAXILLOFACIAL FINDINGS Osseous: No fracture or mandibular dislocation. No destructive process. Orbits: No evidence of injury Sinuses: Negative for hemosinus Soft tissues: No hematoma or foreign body seen. CT CERVICAL SPINE FINDINGS Alignment: No traumatic malalignment Skull base and vertebrae: No acute fracture. No primary bone lesion or focal pathologic process. Soft tissues and spinal canal: No prevertebral fluid or swelling. No visible canal hematoma. Disc levels: Ordinary cervical spine degeneration with endplate and facet spurring. Upper chest: Negative IMPRESSION: No evidence of intracranial or cervical spine injury. Negative for facial fracture. Electronically Signed    By: Jorje Guild M.D.   On: 09/01/2022 11:30   DG Chest Port 1 View  Result Date: 09/01/2022 CLINICAL DATA:  Fall with hypotension EXAM: PORTABLE CHEST 1 VIEW COMPARISON:  08/29/2022 FINDINGS: Cardiomegaly. Bulky mitral annular calcification. Blunting at the lateral left costophrenic sulcus is from mediastinal fat. No edema, effusion, or convincing infiltrate. IMPRESSION: No acute finding when compared to prior. Electronically Signed   By: Jorje Guild M.D.   On: 09/01/2022 10:58     LOS: 0 days   Oren Binet, MD  [facility]    To contact the attending provider between 7A-7P or the covering provider during after hours 7P-7A, please log into the web site [email or web address] and access using universal [place] password for that web site. If you do not have the password, please call the hospital operator.  09/02/2022, 9:30 AM

## 2022-09-02 NOTE — Progress Notes (Signed)
  Echocardiogram 2D Echocardiogram has been performed.  Amber Stephenson 09/02/2022, 12:55 PM

## 2022-09-02 NOTE — Evaluation (Signed)
Occupational Therapy Evaluation Patient Details Name: Amber Stephenson MRN: [id number] DOB: 09/13/1945 Today's Date: 09/02/2022   History of Present Illness Amber Stephenson is a 77 yo female who presents to [facility] on 4/1 with fall/syncope. PMHx: GAD, HTN, HLD BPPV, CAD nonobstructive, CHF, chronic pain, PTSD diabetes mellitus with neuropathy, nonalcoholic fatty liver disease OSA using nocturnal oxygen/pulmonary hypertension, restless leg syndrome, tremor   Clinical Impression   Amber Stephenson was evaluated s/p the above admission list. She reports needing assist for LB ADLs and IADLs from her husband at baseline, pt presents with impaired cognition - unsure of accuracy of PLOF and home set up. Upon evaluation she was limited by cognition, RLE pain, weakness and decreased activity tolerance. Overall she requires mod A to get to the EOB and min A for transfers and mobility with RW. Due to the deficits listed below she also recommends up to max A for LB ADLs and step by step cues. Pt on 3L throughout with monitor reading 70s-80s but had a poor pleth, attempted moving probe with no improvement - pt denied SOB. Pt will benefit from continued acute OT services. Pt will benefit from continued OT in pt's natural environment at discharge. .       Recommendations for follow up therapy are one component of a multi-disciplinary discharge planning process, led by the attending physician.  Recommendations may be updated based on patient status, additional functional criteria and insurance authorization.   Assistance Recommended at Discharge Frequent or constant Supervision/Assistance  Patient can return home with the following A little help with walking and/or transfers;A lot of help with bathing/dressing/bathroom;Assistance with cooking/housework;Direct supervision/assist for financial management;Direct supervision/assist for medications management;Help with stairs or ramp for entrance;Assist for transportation     Functional Status Assessment  Patient has had a recent decline in their functional status and demonstrates the ability to make significant improvements in function in a reasonable and predictable amount of time.  Equipment Recommendations  None recommended by OT       Precautions / Restrictions Precautions Precautions: Fall;Other (comment) Precaution Comments: recent falls Restrictions Weight Bearing Restrictions: No      Mobility Bed Mobility Overal bed mobility: Needs Assistance Bed Mobility: Supine to Sit     Supine to sit: Mod assist     General bed mobility comments: assist for cues and trunk elevation    Transfers Overall transfer level: Needs assistance Equipment used: Rolling walker (2 wheels) Transfers: Sit to/from Stand Sit to Stand: Min assist                  Balance Overall balance assessment: Needs assistance Sitting-balance support: Feet supported Sitting balance-Leahy Scale: Fair     Standing balance support: Bilateral upper extremity supported, No upper extremity supported Standing balance-Leahy Scale: Poor                             ADL either performed or assessed with clinical judgement   ADL Overall ADL's : Needs assistance/impaired Eating/Feeding: Independent   Grooming: Set up;Sitting   Upper Body Bathing: Minimal assistance;Sitting   Lower Body Bathing: Maximal assistance;Sit to/from stand   Upper Body Dressing : Set up;Sitting   Lower Body Dressing: Maximal assistance;Sit to/from stand   Toilet Transfer: Minimal assistance;Ambulation;Rolling walker (2 wheels)   Toileting- Clothing Manipulation and Hygiene: Minimal assistance;Sitting/lateral lean       Functional mobility during ADLs: Minimal assistance;Rolling walker (2 wheels) General ADL Comments: cognitive assist for  initiation, sequencing, safety and problem solving. needs assist for LB ADLs     Vision Baseline Vision/History: 1 Wears glasses Vision  Assessment?: No apparent visual deficits     Perception Perception Perception Tested?: No   Praxis Praxis Praxis tested?: Not tested    Pertinent Vitals/Pain Pain Assessment Pain Assessment: Faces Faces Pain Scale: Hurts little more Pain Location: R hip and foot Pain Descriptors / Indicators: Discomfort Pain Intervention(s): Limited activity within patient's tolerance, Monitored during session     Hand Dominance Right (needs increased time)   Extremity/Trunk Assessment Upper Extremity Assessment Upper Extremity Assessment: Generalized weakness   Lower Extremity Assessment Lower Extremity Assessment: Defer to PT evaluation   Cervical / Trunk Assessment Cervical / Trunk Assessment: Normal   Communication Communication Communication: No difficulties   Cognition Arousal/Alertness: Awake/alert Behavior During Therapy: WFL for tasks assessed/performed Overall Cognitive Status: History of cognitive impairments - at baseline                                 General Comments: Usure of pt baseline, no family present. Oriented to person and place. Required increased time for processing, cues for sequencing and problem solving     General Comments  elevated BP but stable            Home Living Family/patient expects to be discharged to:: Private residence Living Arrangements: Spouse/significant other Available Help at Discharge: Family;Available 24 hours/day Type of Home: House Home Access: Stairs to enter [employer] of Steps: 5 vs 3 Entrance Stairs-Rails: Left Home Layout: One level     Bathroom Shower/Tub: Teacher, early years/pre: Standard Bathroom Accessibility: Yes   Home Equipment: Conservation officer, nature (2 wheels);Cane - single point;Grab bars - tub/shower;Grab bars - toilet;Wheelchair - manual   Additional Comments: pt gave conflicting information throughout      Prior Functioning/Environment Prior Level of Function : Needs  assist             Mobility Comments: per report, no AD ADLs Comments: per report, pt's husband assisting with shower transfer, LB ADLs and IADLs        OT Problem List: Decreased strength;Decreased range of motion;Decreased activity tolerance;Impaired balance (sitting and/or standing);Decreased safety awareness;Decreased cognition;Decreased knowledge of use of DME or AE;Decreased knowledge of precautions      OT Treatment/Interventions: Self-care/ADL training;Therapeutic exercise;DME and/or AE instruction;Therapeutic activities;Patient/family education;Balance training    OT Goals(Current goals can be found in the care plan section) Acute Rehab OT Goals Patient Stated Goal: to get up OT Goal Formulation: With patient Time For Goal Achievement: 09/16/22 Potential to Achieve Goals: Good ADL Goals Pt Will Perform Grooming: with supervision;standing Pt Will Perform Lower Body Dressing: with min assist;sit to/from stand Pt Will Transfer to Toilet: with supervision;ambulating Additional ADL Goal #1: Pt will independently navigate environment with RW to decrease fall risk at discharge  OT Frequency: Min 2X/week       AM-PAC OT "6 Clicks" Daily Activity     Outcome Measure Help from another person eating meals?: None Help from another person taking care of personal grooming?: A Little Help from another person toileting, which includes using toliet, bedpan, or urinal?: A Little Help from another person bathing (including washing, rinsing, drying)?: A Lot Help from another person to put on and taking off regular upper body clothing?: A Little Help from another person to put on and taking off regular lower body clothing?: A Lot  6 Click Score: 17   End of Session Equipment Utilized During Treatment: Gait belt;Rolling walker (2 wheels);Oxygen Nurse Communication: Mobility status  Activity Tolerance: Patient tolerated treatment well Patient left: in chair;with call bell/phone within  reach;with chair alarm set  OT Visit Diagnosis: Unsteadiness on feet (R26.81);Other abnormalities of gait and mobility (R26.89);Muscle weakness (generalized) (M62.81)                Time: [id number] OT Time Calculation (min): 36 min Charges:  OT General Charges $OT Visit: 1 Visit OT Evaluation $OT Eval Moderate Complexity: 1 Mod OT Treatments $Therapeutic Activity: 8-22 mins  Shade Flood, OTR/L Acute Rehabilitation Services Office [phone] Secure Chat Communication Preferred   Elliot Cousin 09/02/2022, 10:07 AM

## 2022-09-03 DIAGNOSIS — F319 Bipolar disorder, unspecified: Secondary | ICD-10-CM | POA: Diagnosis not present

## 2022-09-03 DIAGNOSIS — I959 Hypotension, unspecified: Secondary | ICD-10-CM | POA: Diagnosis not present

## 2022-09-03 DIAGNOSIS — Z79899 Other long term (current) drug therapy: Secondary | ICD-10-CM

## 2022-09-03 DIAGNOSIS — N179 Acute kidney failure, unspecified: Secondary | ICD-10-CM | POA: Diagnosis not present

## 2022-09-03 DIAGNOSIS — G9341 Metabolic encephalopathy: Secondary | ICD-10-CM | POA: Diagnosis not present

## 2022-09-03 LAB — TROPONIN I (HIGH SENSITIVITY)
Troponin I (High Sensitivity): 17 ng/L (ref <18)
Troponin I (High Sensitivity): 15 ng/L (ref <18)

## 2022-09-03 LAB — CBC
HCT: 37.6 % (ref 36.0–46.0)
Hemoglobin: 12.3 g/dL (ref 12.0–15.0)
MCH: 29.6 pg (ref 26.0–34.0)
MCHC: 32.7 g/dL (ref 30.0–36.0)
MCV: 90.4 fL (ref 80.0–100.0)
Platelets: 107 10*3/uL — ABNORMAL LOW (ref 150–400)
RBC: 4.16 MIL/uL (ref 3.87–5.11)
RDW: 13.6 % (ref 11.5–15.5)
WBC: 3.8 10*3/uL — ABNORMAL LOW (ref 4.0–10.5)
nRBC: 0 % (ref 0.0–0.2)

## 2022-09-03 LAB — BASIC METABOLIC PANEL
Anion gap: 11 (ref 5–15)
BUN: 6 mg/dL — ABNORMAL LOW (ref 8–23)
CO2: 25 mmol/L (ref 22–32)
Calcium: 8.3 mg/dL — ABNORMAL LOW (ref 8.9–10.3)
Chloride: 101 mmol/L (ref 98–111)
Creatinine, Ser: 0.83 mg/dL (ref 0.44–1.00)
GFR, Estimated: >60 mL/min (ref >60)
Glucose, Bld: 196 mg/dL — ABNORMAL HIGH (ref 70–99)
Potassium: 4 mmol/L (ref 3.5–5.1)
Sodium: 137 mmol/L (ref 135–145)

## 2022-09-03 LAB — GLUCOSE, CAPILLARY
Glucose-Capillary: 170 mg/dL — ABNORMAL HIGH (ref 70–99)
Glucose-Capillary: 274 mg/dL — ABNORMAL HIGH (ref 70–99)
Glucose-Capillary: 224 mg/dL — ABNORMAL HIGH (ref 70–99)
Glucose-Capillary: 222 mg/dL — ABNORMAL HIGH (ref 70–99)

## 2022-09-03 MED ORDER — PANTOPRAZOLE SODIUM 40 MG PO TBEC
40.0000 mg | DELAYED_RELEASE_TABLET | Freq: Every day | ORAL | Status: DC
  Administered 2022-09-03: 40 mg via ORAL
  Filled 2022-09-03: qty 1

## 2022-09-03 MED ORDER — INSULIN GLARGINE-YFGN 100 UNIT/ML ~~LOC~~ SOLN
15.0000 [IU] | Freq: Two times a day (BID) | SUBCUTANEOUS | Status: DC
  Administered 2022-09-03 – 2022-09-04 (×3): 15 [IU] via SUBCUTANEOUS
  Filled 2022-09-03 (×4): qty 0.15

## 2022-09-03 MED ORDER — GUAIFENESIN-DM 100-10 MG/5ML PO SYRP
5.0000 mL | ORAL_SOLUTION | ORAL | Status: DC | PRN
  Administered 2022-09-04: 5 mL via ORAL
  Filled 2022-09-03 (×2): qty 5

## 2022-09-03 MED ORDER — METOPROLOL TARTRATE 25 MG PO TABS
25.0000 mg | ORAL_TABLET | Freq: Two times a day (BID) | ORAL | Status: DC
  Administered 2022-09-03 (×2): 25 mg via ORAL
  Filled 2022-09-03 (×2): qty 1

## 2022-09-03 MED ORDER — ALUM & MAG HYDROXIDE-SIMETH 200-200-20 MG/5ML PO SUSP
30.0000 mL | Freq: Four times a day (QID) | ORAL | Status: DC | PRN
  Administered 2022-09-03: 30 mL via ORAL
  Filled 2022-09-03: qty 30

## 2022-09-03 MED ORDER — PHENOL 1.4 % MT LIQD
1.0000 | OROMUCOSAL | Status: DC | PRN
  Administered 2022-09-04: 1 via OROMUCOSAL
  Filled 2022-09-03: qty 177

## 2022-09-03 NOTE — Progress Notes (Addendum)
PROGRESS NOTE        PATIENT DETAILS Name: Amber Stephenson Age: 77 y.o. Sex: female Date of Birth: Aug 30, 1945 Admit Date: 09/01/2022 Admitting Physician Karmen Bongo, MD [id number], Cletus Gash, MD  Brief Summary: Patient is a 77 y.o.  female HTN, DM-2, COPD, A-fib, chronic HFpEF, chronic pain syndrome, restless leg syndrome, bipolar disorder who presented to the hospital with unsteady gait, frequent falls-was found to be hypotensive-there was concern for sepsis physiology-and hence patient was admitted to the hospitalist service.  See below for further details.  Significant events: 4/1>> admit to [facility]  Significant studies: 4/1>> CT head: No acute intracranial abnormality 4/1>> CT C-spine: No cervical spine injury 4/1>> CT maxillofacial: No facial fracture 4/1>> CT chest: No acute findings-no PNA 4/1>> abdominal ultrasound: No acute abnormality identified 4/2>> EEG: No seizures  Significant microbiology data: 4/1>> blood culture: Negative 4/1>> UA: No evidence of UTI  Procedures: None none  Consults: PCCM  Subjective: Awake/alert-husband at bedside.  Acknowledges clinical improvement.  Objective: Vitals: Blood pressure 115/73, pulse 82, temperature 98.1 F (36.7 C), temperature source Oral, resp. rate 20, weight 83.5 kg, SpO2 98 %.   Exam: Gen Exam:Alert awake-not in any distress HEENT:atraumatic, normocephalic Chest: B/L clear to auscultation anteriorly CVS:S1S2 regular Abdomen:soft non tender, non distended Extremities:no edema Neurology: Non focal Skin: no rash  Pertinent Labs/Radiology:    Latest Ref Rng & Units 09/03/2022    9:35 AM 09/02/2022    9:58 AM 09/01/2022    3:01 PM  CBC  WBC 4.0 - 10.5 K/uL 3.8  4.7    Hemoglobin 12.0 - 15.0 g/dL 12.3  11.8  12.2   Hematocrit 36.0 - 46.0 % 37.6  35.0  36.0   Platelets 150 - 400 K/uL 107  122      Lab Results  Component Value Date   NA 137 09/03/2022   K 4.0 09/03/2022   CL 101  09/03/2022   CO2 25 09/03/2022      Assessment/Plan: Hypotension Acute toxic metabolic encephalopathy Frequent falls Felt to be related to polypharmacy/antihypertensive/diuretic use-although sepsis was contemplated on admission-it is now felt to be unlikely (sepsis ruled out)  Clinical improvement continues-being monitored off antibiotics since 4/2.   BP better-but still in the low 100s range.  Polypharmacy Reviewed home medication list-on benzos/narcotics/psychotropic medications Significantly better-completely awake/alert Some of these medications will need slow titration in the outpatient setting I have consulted psychiatry to see if we can decrease some of her psych medications.    AKI Mild Suspect hemodynamically mediated kidney injury Resolved with IVF titration.  Chest pain on 4/3 morning Atypical-per patient-feels like gas EKG nonacute Awaiting tropes Start PPI As needed Maalox  HTN BP improving Restarting metoprolol today  Continue to hold other antihypertensives  Chronic atrial fibrillation Rate controlled this morning but had a few episodes of RVR overnight Restarting beta-blocker Continue Eliquis.  Chronic HFpEF Euvolemic Beta-blocker restarted 4/3 Continue to hold lisinopril Suspect can make diuretics as needed for weight gain.   HLD Statin  DM-2 CBGs slightly on the higher side-increase Semglee to 15 units twice daily Continue SSI Continue to hold metformin and other oral hypoglycemic agents-they will be resumed on discharge.    Recent Labs    09/02/22 2108 09/03/22 0803 09/03/22 1152  GLUCAP 160* 170* 274*     Vitamin B12 deficiency Begin supplementation  OSA intolerant to  CPAP-on nocturnal home O2 Bronchial asthma Stable-continue bronchodilators/nocturnal O2 as needed  Chronic pain syndrome Chronic back pain Mentation has improved-on narcotics-watch closely. Will need PCP to consider titrating down narcotics slowly in the  outpatient setting.  Restless leg syndrome Bipolar disorder Anxiety disorder Peripheral neuropathy  Continue to Trileptal/Lexapro/Abilify/Cogentin On reduced dose lorazepam/Lyrica currently At patient's request-have consulted psychiatry to see if we can minimize some of psych meds.  Obesity: Estimated body mass index is 33.65 kg/m as calculated from the following:   Height as of 08/29/22: 5\' 2"  (1.575 m).   Weight as of this encounter: 83.5 kg.   Code status:   Code Status: Full Code   DVT Prophylaxis: Place TED hose Start: 09/01/22 1845 apixaban (ELIQUIS) tablet 5 mg    Family Communication:Spouse-[phone]-updated at bedside on 4/3.   Disposition Plan: Status is: Observation The patient will require care spanning > 2 midnights and should be moved to inpatient because: Severity of illness   Planned Discharge Destination:Home health hopefully 4/4.   Diet: Diet Order             Diet heart healthy/carb modified Room service appropriate? Yes; Fluid consistency: Thin  Diet effective now                     Antimicrobial agents: Anti-infectives (From admission, onward)    Start     Dose/Rate Route Frequency Ordered Stop   09/03/22 1200  vancomycin (VANCOCIN) IVPB 1000 mg/200 mL premix  Status:  Discontinued        1,000 mg 200 mL/hr over 60 Minutes Intravenous Every 48 hours 09/01/22 1122 09/02/22 0940   09/02/22 0000  ceFEPIme (MAXIPIME) 2 g in sodium chloride 0.9 % 100 mL IVPB  Status:  Discontinued        2 g 200 mL/hr over 30 Minutes Intravenous Every 12 hours 09/01/22 1122 09/02/22 0940   09/01/22 2200  metroNIDAZOLE (FLAGYL) IVPB 500 mg  Status:  Discontinued        500 mg 100 mL/hr over 60 Minutes Intravenous Every 12 hours 09/01/22 1842 09/02/22 0940   09/01/22 1230  vancomycin (VANCOCIN) IVPB 1000 mg/200 mL premix        1,000 mg 200 mL/hr over 60 Minutes Intravenous  Once 09/01/22 1106 09/01/22 1349   09/01/22 1100  ceFEPIme (MAXIPIME) 2 g  in sodium chloride 0.9 % 100 mL IVPB        2 g 200 mL/hr over 30 Minutes Intravenous  Once 09/01/22 1058 09/01/22 1209   09/01/22 1100  metroNIDAZOLE (FLAGYL) IVPB 500 mg        500 mg 100 mL/hr over 60 Minutes Intravenous  Once 09/01/22 1058 09/01/22 1325   09/01/22 1100  vancomycin (VANCOCIN) IVPB 1000 mg/200 mL premix        1,000 mg 200 mL/hr over 60 Minutes Intravenous  Once 09/01/22 1058 09/01/22 1325        MEDICATIONS: Scheduled Meds:  apixaban  5 mg Oral BID   ARIPiprazole  2 mg Oral QHS   benztropine  1 mg Oral BID   cyanocobalamin  1,000 mcg Subcutaneous Daily   digoxin  0.25 mg Intravenous Once   escitalopram  10 mg Oral Daily   insulin aspart  0-9 Units Subcutaneous TID WC   insulin glargine-yfgn  15 Units Subcutaneous BID   LORazepam  0.25 mg Oral BID   metoprolol tartrate  25 mg Oral BID   mometasone-formoterol  2 puff Inhalation BID   morphine  15 mg Oral Q12H   OXcarbazepine  150 mg Oral BID   pantoprazole  40 mg Oral Q1200   pregabalin  100 mg Oral BID   Continuous Infusions:   PRN Meds:.acetaminophen **OR** acetaminophen, albuterol, alum & mag hydroxide-simeth, ondansetron **OR** ondansetron (ZOFRAN) IV   I have personally reviewed following labs and imaging studies  LABORATORY DATA: CBC: Recent Labs  Lab 09/01/22 1023 09/01/22 1501 09/02/22 0958 09/03/22 0935  WBC 8.5  --  4.7 3.8*  NEUTROABS 5.3  --   --   --   HGB 14.4 12.2 11.8* 12.3  HCT 42.9 36.0 35.0* 37.6  MCV 87.9  --  88.6 90.4  PLT 157  --  122* 107*     Basic Metabolic Panel: Recent Labs  Lab 09/01/22 1023 09/01/22 1501 09/02/22 0958 09/03/22 0935  NA 137 136 137 137  K 3.8 4.8 3.3* 4.0  CL 98  --  102 101  CO2 23  --  27 25  GLUCOSE 234*  --  134* 196*  BUN 22  --  11 6*  CREATININE 1.53*  --  0.92 0.83  CALCIUM 9.4  --  8.0* 8.3*     GFR: Estimated Creatinine Clearance: 58.7 mL/min (by C-G formula based on SCr of 0.83 mg/dL).  Liver Function Tests: Recent  Labs  Lab 09/01/22 1023 09/02/22 0958  AST 22 27  ALT 10 13  ALKPHOS 55 42  BILITOT 1.1 0.8  PROT 7.7 5.5*  ALBUMIN 4.1 2.5*    No results for input(s): "LIPASE", "AMYLASE" in the last 168 hours. Recent Labs  Lab 09/01/22 1857  AMMONIA 16     Coagulation Profile: Recent Labs  Lab 09/01/22 1023  INR 1.4*     Cardiac Enzymes: Recent Labs  Lab 09/01/22 1857  CKTOTAL 387*     BNP (last 3 results) No results for input(s): "PROBNP" in the last 8760 hours.  Lipid Profile: No results for input(s): "CHOL", "HDL", "LDLCALC", "TRIG", "CHOLHDL", "LDLDIRECT" in the last 72 hours.  Thyroid Function Tests: Recent Labs    09/01/22 1857  TSH 0.785     Anemia Panel: Recent Labs    09/01/22 1857  VITAMINB12 211     Urine analysis:    Component Value Date/Time   COLORURINE YELLOW 09/01/2022 1304   APPEARANCEUR CLEAR 09/01/2022 1304   APPEARANCEUR Clear 03/26/2022 0815   LABSPEC 1.009 09/01/2022 1304   PHURINE 6.5 09/01/2022 1304   GLUCOSEU NEGATIVE 09/01/2022 1304   HGBUR TRACE (A) 09/01/2022 1304   BILIRUBINUR NEGATIVE 09/01/2022 1304   BILIRUBINUR Negative 03/26/2022 0815   KETONESUR NEGATIVE 09/01/2022 1304   PROTEINUR TRACE (A) 09/01/2022 1304   UROBILINOGEN 0.2 07/23/2015 1350   NITRITE NEGATIVE 09/01/2022 1304   LEUKOCYTESUR TRACE (A) 09/01/2022 1304    Sepsis Labs: Lactic Acid, Venous    Component Value Date/Time   LATICACIDVEN 1.4 09/01/2022 2136    MICROBIOLOGY: Recent Results (from the past 240 hour(s))  Blood Culture (routine x 2)     Status: None (Preliminary result)   Collection Time: 09/01/22 10:23 AM   Specimen: BLOOD RIGHT ARM  Result Value Ref Range Status   Specimen Description   Final    BLOOD RIGHT ARM Performed at [facility], [address], [place], [place] [address]    Special Requests   Final    BOTTLES DRAWN AEROBIC AND ANAEROBIC Blood Culture adequate volume Performed at [facility], [address] [place], [place] [address]  Culture   Final    NO GROWTH 2 DAYS Performed at [facility], [place] [address], [place], [place] [address]    Report Status PENDING  Incomplete    RADIOLOGY STUDIES/RESULTS: ECHOCARDIOGRAM LIMITED  Result Date: 09/02/2022    ECHOCARDIOGRAM LIMITED REPORT   Patient Name:   Amber Stephenson Pau Date of Exam: 09/02/2022 Medical Rec #:  [id number]         Height:       62.0 in Accession #:    [id number]        Weight:       184.0 lb Date of Birth:  [birth year]          BSA:          1.845 m Patient Age:    80 years          BP:           129/77 mmHg Patient Gender: F                 HR:           93 bpm. Exam Location:  Inpatient Procedure: Limited Echo, Cardiac Doppler and Color Doppler Indications:    R55 Syncope  History:        Patient has prior history of Echocardiogram examinations, most                 recent 06/09/2022. CHF, CAD, Abnormal ECG, Arrythmias:Atrial                 Fibrillation; Risk Factors:Dyslipidemia, Diabetes and                 Hypertension.  Sonographer:    Roseanna Rainbow RDCS Referring Phys: [id number] [facility]  Sonographer Comments: Technically difficult study due to poor echo windows and patient is obese. Image acquisition challenging due to patient body habitus. IMPRESSIONS  1. Left ventricular ejection fraction, by estimation, is 65 to 70%. The left ventricle has normal function. Left ventricular diastolic parameters are indeterminate.  2. The right ventricular size is mildly enlarged.  3. Left atrial size was severely dilated.  4. The mitral valve is degenerative. Moderate mitral annular calcification.  5. The aortic valve is tricuspid. Aortic valve regurgitation is trivial.  6. The inferior vena cava is dilated in size with <50% respiratory variability, suggesting right atrial pressure of 15 mmHg. Comparison(s): Prior images reviewed side by side. Limited study, IVC is dilated from prior. FINDINGS  Left Ventricle: Left  ventricular ejection fraction, by estimation, is 65 to 70%. The left ventricle has normal function. Left ventricular diastolic parameters are indeterminate. Right Ventricle: The right ventricular size is mildly enlarged. Left Atrium: Left atrial size was severely dilated. Pericardium: Trivial pericardial effusion is present. The pericardial effusion is circumferential. Mitral Valve: The mitral valve is degenerative in appearance. Moderate mitral annular calcification. MV peak gradient, 8.0 mmHg. The mean mitral valve gradient is 3.0 mmHg. Tricuspid Valve: The tricuspid valve is not well visualized. Tricuspid valve regurgitation is not demonstrated. Aortic Valve: The aortic valve is tricuspid. Aortic valve regurgitation is trivial. Aorta: The aortic root and ascending aorta are structurally normal, with no evidence of dilitation. Venous: The inferior vena cava is dilated in size with less than 50% respiratory variability, suggesting right atrial pressure of 15 mmHg. Additional Comments: Spectral Doppler performed. Color Doppler performed.  LEFT VENTRICLE PLAX 2D LVIDd:         3.20 cm LVIDs:  2.10 cm LV PW:         1.70 cm LV IVS:        1.30 cm  IVC IVC diam: 2.80 cm  AORTA Ao Asc diam: 3.80 cm MITRAL VALVE MV Peak grad: 8.0 mmHg MV Mean grad: 3.0 mmHg MV Vmax:      1.41 m/s MV Vmean:     77.0 cm/s Rudean Haskell MD Electronically signed by Rudean Haskell MD Signature Date/Time: 09/02/2022/4:45:38 PM    Final    EEG adult  Result Date: 09/02/2022 Lora Havens, MD     09/02/2022  9:04 AM Patient Name: Amber Stephenson MRN: [id number] Epilepsy Attending: Lora Havens Referring Physician/Provider: Antonieta Pert, MD Date: 09/01/2022 Duration: 21.48 mins Patient history: 77yo F with ams. EEG to evaluate for seizure. Level of alertness: Awake AEDs during EEG study: None Technical aspects: This EEG study was done with scalp electrodes positioned according to the 10-20 International system of electrode  placement. Electrical activity was reviewed with band pass filter of 1-70Hz , sensitivity of 7 uV/mm, display speed of 23mm/sec with a 60Hz  notched filter applied as appropriate. EEG data were recorded continuously and digitally stored.  Video monitoring was available and reviewed as appropriate. Description: The posterior dominant rhythm consists of 6 Hz activity of moderate voltage (25-35 uV) seen predominantly in posterior head regions, symmetric and reactive to eye opening and eye closing. EEG showed continuous generalized 3 to 6 Hz theta-delta slowing. Hyperventilation and photic stimulation were not performed.   ABNORMALITY - Continuous slow, generalized IMPRESSION: This study is suggestive of moderate diffuse encephalopathy, nonspecific etiology. No seizures or epileptiform discharges were seen throughout the recording. Priyanka Barbra Sarks   US Abdomen Complete  Result Date: 09/01/2022 CLINICAL DATA:  Unspecified abdominal pain EXAM: ABDOMEN ULTRASOUND COMPLETE COMPARISON:  None Available. FINDINGS: Gallbladder: No gallstones or wall thickening visualized. No sonographic Murphy sign noted by sonographer. Common bile duct: Diameter: 2-3 mm in proximal diameter. Liver: Hepatic parenchymal echogenicity is diffusely increased and there is coarsening of the hepatic echotexture in keeping with mild hepatic steatosis. No focal intrahepatic masses are seen and there is no intrahepatic biliary ductal dilation. Portal vein is patent on color Doppler imaging with normal direction of blood flow towards the liver. IVC: No abnormality visualized. Pancreas: Obscured by overlying bowel gas Spleen: Not visualized due to limited patient mobility Right Kidney: Length: 11.7 cm. Echogenicity within normal limits. No mass or hydronephrosis visualized. Left Kidney: Not visualized due to limited patient mobility Abdominal aorta: Obscured by overlying bowel gas Other findings: None. IMPRESSION: 1. Limited examination with  nonvisualization of the spleen and left kidney. No acute abnormality identified. 2. Mild hepatic steatosis. Electronically Signed   By: Fidela Salisbury M.D.   On: 09/01/2022 19:52     LOS: 1 day   Oren Binet, MD  [facility]    To contact the attending provider between 7A-7P or the covering provider during after hours 7P-7A, please log into the web site [email or web address] and access using universal [place] password for that web site. If you do not have the password, please call the hospital operator.  09/03/2022, 12:20 PM

## 2022-09-03 NOTE — Consult Note (Signed)
[facility] New Face-to-Face Psychiatric Evaluation   Service Date: September 03, 2022 LOS:  LOS: 1 day    Assessment  Amber Stephenson is a 77 y.o. female admitted medically for 09/01/2022  9:19 AM for falls and hypotension. She carries the psychiatric diagnoses of bipolar disorder and PTSD and has a past medical history listed below. Psychiatry was consulted for polypharmacy by Dr. Sloan Leiter.    Her current presentation of frequent falls, memory issues, and sleep issues is most consistent with polypharmacy. She meets criteria for bipolar disorder based on history and clinical interview; last depressive episode about 1 year ago and last manic episode remote. She has had multiple hospitalizations and suicide attempts over the long course of her illness. Pt and husband at bedside feel that her outpatient regimen is working fairly well to "level her out"; and are hopeful that she will be able to find a balance between effective, necessary medications for continued remission from bipolar illness and minimizing medications contributing to frequent falls and memory loss.  Several medications were down-titrated or removed PRIOR to psychiatric consultation; these include doxepin, belsomra, lyrica, ativan, and tegretol (which was replaced by oxcarb). This was done thoughtfully, and I have no further medication changes I believe would be beneficial at this time - would want to see how pt tolerates these changes over a couple of weeks before making additional changes. We will not be making furhter medication changes; would consider seeing if she tolerated a taper of cogentin (which is quite anticholinergic) especially if she was on a higher potency dopamine blocker than abilify when it was started.   If taper not tolerable, would consider substituting ingrezza. Will have team see x1 tomorrow- would consider increasing ativan to as much as 0.5 mg BID or TID (home dose 1 TID) if anxiety worsens.    Diagnoses:  Active Hospital problems: Principal Problem:   Hypotension Active Problems:   Chronic back pain   Essential hypertension   Chronic diastolic heart failure   Diabetic neuropathy   OSA (obstructive sleep apnea)   Bipolar 1 disorder   Benign paroxysmal positional vertigo due to bilateral vestibular disorder   CAD (coronary artery disease)   Ambulatory dysfunction   Type II diabetes mellitus   Dyslipidemia   Acute metabolic encephalopathy   AKI (acute kidney injury)   Frequent falls     Plan  ## Safety and Observation Level:  - Based on my clinical evaluation, I estimate the patient to be at low risk of self harm in the current setting - At this time, we recommend a routine level of observation. This decision is based on my review of the chart including patient's history and current presentation, interview of the patient, mental status examination, and consideration of suicide risk including evaluating suicidal ideation, plan, intent, suicidal or self-harm behaviors, risk factors, and protective factors. This judgment is based on our ability to directly address suicide risk, implement suicide prevention strategies and develop a safety plan while the patient is in the clinical setting. Please contact our team if there is a concern that risk level has changed.   ## Medications:  -- continue current meds  Abilify 2 QHS Cogentin 1 BID Lexapro 10 qD Ativan 0.25 BID  Oxcarb 150 BID Pregabalin 100 BID    ## Medical Decision Making Capacity:  Not formally assessed  ## Further Work-up:  -- none currently, appreciate thorough workup by primary team   -- most recent EKG on 4/3 had QtC  of 435 in afib -- Pertinent labwork reviewed earlier this admission includes: TSH wnl  ## Disposition:  -- per primary   Thank you for this consult request. Recommendations have been communicated to the primary team.  We will continue to follow at this time.   Viola A  Borghild Thaker   New history  Relevant Aspects of Hospital Course:  Admitted on 09/01/2022 for frequent falls and hypotension.  Patient Report:  Patient seen in afternoon; husband remains at bedside with patient consent.  She is alert and fully oriented including to room number.  She is able to do the days of the week backwards with 1 error.  She reports that her mental health has generally leveled out over the last several years, but with no manic episodes for many years and last depressive episode about 1 year ago with suicidal ideations but no impairment in functioning.  She reports that her sleep is currently "the best it has been in years", with no difficulty falling asleep staying asleep-getting about 7 hours per night.  She reflected over the long course of her illness including "the whole life that she is so grateful for" after her first suicide attempt at the age of 34.  She endorsed no current psychiatric symptoms, however when discussed reduction in medications that occurred prior to psychiatric consult she became very anxious stating "I do not have any say", "it does not feel like I am in the driver seat" and other similar statements when discussing reduction in Ativan.  Discussed risk of Ativan on falls, memory, and patient's previous statements that she was doing very well up until that point in the conversation.  Patient's husband was an active participant in this discussion.  We discussed leaving things as they are for now, with reevaluation of anxiety symptoms tomorrow and increasing potentially up to 50% of home dose.  She was future oriented throughout interview, talking about the many things she enjoys while retired.  No SI, HI, AH/VH  I let pt and husband know a summary of changes would be in her discharge instructions.  Patient is able to verify which medication she is on, but does not know the doses or indications for most of her medications.  ROS:  Feelings of anxiety after  reduction in Ativan discussed.  Denied recent feelings of depression.  No anhedonia, insomnia, racing thoughts, paranoia, delusions.  Collateral information:  Husband present for interview  Psychiatric History:  Information collected from pt, medical record Index suicide attempt age 89, has been on some sort of benzodiazepine since then. Most of her manic episodes were between [place], has responded well to mood stabilizer since. Multiple lifetime psychiatric hospitalizations, only 1 in the last 10 to 15 years. Experienced some suicidal ideations about a year ago, these have gotten easier to deal with as  PTA meds Abilify (aripiprazole) 2 mg daily - unchanged Cogentin (benztropine) 1 mg BID - unchanged Lexapro (escitalopram) 10 mg daily -- unchanged  Lyrica (pregabalin) 100 mg three times daily was reduced to two times daily Tegretol (carbamazepine) 100 mg daily was changed to oxcarb (oxcarbazepine) 150 mg twice daily Sinequan (doxepin_ 10 mg nightly  was stopped Belsomra (suvorexant) 20 mg nightly was stopped Ativan (lorazepam) was changed from 1 mg three times daily to 0.25 mg twice daily  Social History:  Lives with husband or 77 years, retired 3 kids, all over the [place] Socially active with church, dinner group every other week  Tobacco use: no Alcohol use: no  Drug use: no  Family History:  The patient's family history includes Alcohol abuse in her sister; Alzheimer's disease in her father; Diabetes in her brother, mother, and sister; Heart disease in her brother, mother, and sister; Mental illness in her brother; Stroke in her brother.  Medical History: Past Medical History:  Diagnosis Date   Acquired hammer toe 12/06/2020   Allergic rhinitis 02/24/2019   Ambulatory dysfunction 04/23/2020   Bipolar 1 disorder 01/23/2015   with GAD, benzo dependence   BPPV (benign paroxysmal positional vertigo) 05/20/2019   CAD (coronary artery disease)    Nonobstructive;  Managed by Dr. Bronson Ing   Chronic constipation 04/25/2020   Chronic diastolic heart failure [id number]   Chronic pain syndrome 08/22/2019   back pain, sacroiliitis   Chronic post-traumatic stress disorder (PTSD) 12/06/2020   Diabetic neuropathy 02/06/2016   Dyslipidemia 09/24/2020   Essential hypertension    Fibromyalgia 03/19/2017   Functional diarrhea 10/26/2020   Head trauma 09/17/2020   Hemorrhoids 03/11/2022   Herpes genitalis in women 07/16/2015   Insomnia 01/23/2015   Migraine headache with aura 02/12/2016   Mild intermittent asthma 01/12/2018   Myofascial pain dysfunction syndrome [id number]   Non-alcoholic fatty liver disease 01/12/2018   Opioid dependence 12/06/2020   OSA (obstructive sleep apnea) 02/24/2019   10/09/2018 - HST  - AHI 40.6    Osteopenia 12/06/2020   Rx alendronate 35 mg.   Pulmonary hypertension    RLS (restless legs syndrome) 04/27/2015   Tremor, essential 12/11/2021   Type II diabetes mellitus 09/24/2020    Surgical History: Past Surgical History:  Procedure Laterality Date   BREAST REDUCTION SURGERY     EYE SURGERY Right    cateracts   HAMMER TOE SURGERY     LEFT HEART CATH AND CORONARY ANGIOGRAPHY N/A 02/03/2018   Procedure: LEFT HEART CATH AND CORONARY ANGIOGRAPHY;  Surgeon: Martinique, Peter M, MD;  Location: [facility];  Service: Cardiovascular;  Laterality: N/A;   REVERSE SHOULDER ARTHROPLASTY Right 08/19/2019   Procedure: REVERSE SHOULDER ARTHROPLASTY;  Surgeon: Netta Cedars, MD;  Location: [facility];  Service: Orthopedics;  Laterality: Right;  interscalene block   SHOULDER SURGERY Right    "I BROKE MY SHOUDLER   THIGH SURGERY     "TO REMOVE A TUMOR "    Medications:   Current Facility-Administered Medications:    acetaminophen (TYLENOL) tablet 650 mg, 650 mg, Oral, Q6H PRN, 650 mg at 09/02/22 1945 **OR** acetaminophen (TYLENOL) suppository 650 mg, 650 mg, Rectal, Q6H PRN, Kc, Ramesh, MD   albuterol (PROVENTIL) (2.5 MG/3ML) 0.083%  nebulizer solution 2.5 mg, 2.5 mg, Nebulization, PRN, Kc, Ramesh, MD   alum & mag hydroxide-simeth (MAALOX/MYLANTA) 200-200-20 MG/5ML suspension 30 mL, 30 mL, Oral, Q6H PRN, Ghimire, Shanker M, MD, 30 mL at 09/03/22 1210   apixaban (ELIQUIS) tablet 5 mg, 5 mg, Oral, BID, Kc, Ramesh, MD, 5 mg at 09/03/22 0844   ARIPiprazole (ABILIFY) tablet 2 mg, 2 mg, Oral, QHS, Kc, Ramesh, MD, 2 mg at 09/02/22 2114   benztropine (COGENTIN) tablet 1 mg, 1 mg, Oral, BID, Kc, Ramesh, MD, 1 mg at 09/03/22 0844   cyanocobalamin (VITAMIN B12) injection 1,000 mcg, 1,000 mcg, Subcutaneous, Daily, Ghimire, Shanker M, MD, 1,000 mcg at 09/03/22 0846   digoxin (LANOXIN) 0.25 MG/ML injection 0.25 mg, 0.25 mg, Intravenous, Once, Kristopher Oppenheim, DO   escitalopram (LEXAPRO) tablet 10 mg, 10 mg, Oral, Daily, Kc, Ramesh, MD, 10 mg at 09/03/22 0845   insulin aspart (novoLOG) injection 0-9 Units, 0-9 Units,  Subcutaneous, TID WC, Ghimire, Henreitta Leber, MD, 3 Units at 09/03/22 1608   insulin glargine-yfgn (SEMGLEE) injection 15 Units, 15 Units, Subcutaneous, BID, Ghimire, Henreitta Leber, MD, 15 Units at 09/03/22 1248   LORazepam (ATIVAN) tablet 0.25 mg, 0.25 mg, Oral, BID, Kc, Ramesh, MD, 0.25 mg at 09/03/22 0844   metoprolol tartrate (LOPRESSOR) tablet 25 mg, 25 mg, Oral, BID, Ghimire, Shanker M, MD, 25 mg at 09/03/22 0844   mometasone-formoterol (DULERA) 200-5 MCG/ACT inhaler 2 puff, 2 puff, Inhalation, BID, Kc, Ramesh, MD, 2 puff at 09/03/22 0808   morphine (MS CONTIN) 12 hr tablet 15 mg, 15 mg, Oral, Q12H, Mansy, Jan A, MD, 15 mg at 09/03/22 0844   ondansetron (ZOFRAN) tablet 4 mg, 4 mg, Oral, Q6H PRN **OR** ondansetron (ZOFRAN) injection 4 mg, 4 mg, Intravenous, Q6H PRN, Kc, Ramesh, MD   OXcarbazepine (TRILEPTAL) tablet 150 mg, 150 mg, Oral, BID, Kc, Ramesh, MD, 150 mg at 09/03/22 0846   pantoprazole (PROTONIX) EC tablet 40 mg, 40 mg, Oral, Q1200, Ghimire, Henreitta Leber, MD, 40 mg at 09/03/22 1248   pregabalin (LYRICA) capsule 100 mg, 100 mg,  Oral, BID, Ghimire, Henreitta Leber, MD, 100 mg at 09/03/22 [id number]  Facility-Administered Medications Ordered in Other Encounters:    bupivacaine-epinephrine (MARCAINE W/ EPI) 0.5% -1:200000 injection, , , Anesthesia Intra-op, Effie Berkshire, MD, 12 mL at 01/21/19 0935  Allergies: Allergies  Allergen Reactions   Iodine Anaphylaxis   Ivp Dye [Iodinated Contrast Media] Anaphylaxis and Swelling    Throat closes    Latex Other (See Comments)    Latex tape pulls skin with it   Tizanidine Other (See Comments)    Weakness, goofy, bad dreams       Objective  Vital signs:  Temp:  [98 F (36.7 C)-99.4 F (37.4 C)] 98.1 F (36.7 C) (04/03 0800) Pulse Rate:  [82-127] 82 (04/03 1152) Resp:  [13-21] 20 (04/03 1152) BP: (86-139)/(61-79) 115/73 (04/03 1152) SpO2:  [94 %-100 %] 98 % (04/03 1152) FiO2 (%):  [32 %] 32 % (04/02 2035)  Psychiatric Specialty Exam:  Presentation  General Appearance: Appropriate for Environment  Eye Contact:Good  Speech:Clear and Coherent  Speech Volume:Normal  Handedness:No data recorded  Mood and Affect  Mood:-- (Good, well rested)  Affect:Congruent; Full Range   Thought Process  Thought Processes:Coherent; Goal Directed  Descriptions of Associations:Intact  Orientation:Full (Time, Place and Person)  Thought Content:-- (devoid of delusions, paranoia)  History of Schizophrenia/Schizoaffective disorder:No data recorded Duration of Psychotic Symptoms:No data recorded Hallucinations:Hallucinations: None  Ideas of Reference:None  Suicidal Thoughts:Suicidal Thoughts: No  Homicidal Thoughts:Homicidal Thoughts: No   Sensorium  Memory:Immediate Fair; Recent Poor; Remote Good  Judgment:Fair  Insight:Fair   Executive Functions  Concentration:Fair  Attention Span:Good  [place] of Knowledge:Good  Language:Good   Psychomotor Activity  Psychomotor Activity:Psychomotor Activity: Normal   Assets  Assets:Desire for  Improvement; Housing; Intimacy; Leisure Time; Social Support   Sleep  Sleep:Sleep: Good Number of Hours of Sleep: 7    Physical Exam: Physical Exam HENT:     Head: Normocephalic.  Eyes:     Conjunctiva/sclera: Conjunctivae normal.  Pulmonary:     Effort: Pulmonary effort is normal.  Neurological:     Mental Status: She is alert.     Comments: 3 beats clonus in RLE (pt with back injury, within normal limits) NO rigidity x4 extremities     Blood pressure 115/73, pulse 82, temperature 98.1 F (36.7 C), temperature source Oral, resp. rate 20, weight 83.5 kg, SpO2  98 %. Body mass index is 33.65 kg/m.

## 2022-09-03 NOTE — Progress Notes (Signed)
Physical Therapy Treatment Patient Details Name: Amber Stephenson MRN: [id number] DOB: 1946/05/13 Today's Date: 09/03/2022   History of Present Illness Patient is a 77 yo female who presents to [facility] on 4/1 with fall/syncope. Found to have Acute toxic metabolic encephalopathy, hypotension and likely polypharmacy. PMHx includes HTN, BPPV, CAD nonobstructive, CHF, chronic pain, PTSD, DM, nonalcoholic fatty liver disease, OSA using nocturnal oxygen/pulmonary hypertension, RLS, tremor.    PT Comments    Pt tolerated today's session well, able to progress in ambulation distance, maintaining sats on room air. Attempted SPC trial but pt declining and utilizing RW throughout session, mild path deviation and imbalance noted, with cues required for obstacle negotiation and for decreased veer, pt's husband present throughout and providing cues for safety as needed. Will continue to assess safest AD for mobility in the home. Acute PT will continue to follow up with pt to progress mobility, discharge recommendations remain appropriate.     Recommendations for follow up therapy are one component of a multi-disciplinary discharge planning process, led by the attending physician.  Recommendations may be updated based on patient status, additional functional criteria and insurance authorization.  Follow Up Recommendations       Assistance Recommended at Discharge Frequent or constant Supervision/Assistance  Patient can return home with the following A little help with walking and/or transfers;A little help with bathing/dressing/bathroom;Help with stairs or ramp for entrance;Assist for transportation;Assistance with cooking/housework   Equipment Recommendations  None recommended by PT    Recommendations for Other Services       Precautions / Restrictions Precautions Precautions: Fall;Other (comment) Precaution Comments: recent falls Restrictions Weight Bearing Restrictions: No     Mobility  Bed  Mobility Overal bed mobility: Needs Assistance Bed Mobility: Supine to Sit     Supine to sit: Min assist, HOB elevated     General bed mobility comments: minA for trunk support into sitting    Transfers Overall transfer level: Needs assistance Equipment used: Rolling walker (2 wheels) Transfers: Sit to/from Stand Sit to Stand: Min assist           General transfer comment: Min A to power to standing with cues for hand placement/technique.    Ambulation/Gait Ambulation/Gait assistance: Min assist Gait Distance (Feet): 75 Feet (with ~2 standing rest breaks) Assistive device: Rolling walker (2 wheels) Gait Pattern/deviations: Step-through pattern, Decreased stride length, Wide base of support, Decreased dorsiflexion - right, Decreased dorsiflexion - left, Drifts right/left Gait velocity: decreased     General Gait Details: varying gait speed, path deviation noted with cueing for obstacle negotiation. Attempted SPC trial but pt declining and utilizing RW   Stairs             Wheelchair Mobility    Modified Rankin (Stroke Patients Only)       Balance Overall balance assessment: Needs assistance Sitting-balance support: Feet supported, No upper extremity supported Sitting balance-Leahy Scale: Fair     Standing balance support: During functional activity, Bilateral upper extremity supported, Reliant on assistive device for balance Standing balance-Leahy Scale: Poor Standing balance comment: Requires UE support in standing.                            Cognition Arousal/Alertness: Awake/alert Behavior During Therapy: WFL for tasks assessed/performed Overall Cognitive Status: History of cognitive impairments - at baseline  General Comments: pt pleasant but requires increaesed cueing for obstacle negotiation and safety, husband present throughout and assisting        Exercises      General Comments  General comments (skin integrity, edema, etc.): HR elevating to 130-140s with ambulation, improving with standing or seated rest breaks, SPO2 in 90s throughout session on room air, pt left on room air and RN notified      Pertinent Vitals/Pain Pain Assessment Pain Assessment: Faces Faces Pain Scale: Hurts even more Pain Location: B feet Pain Descriptors / Indicators: Discomfort, Sore, Grimacing, Guarding Pain Intervention(s): Limited activity within patient's tolerance, Monitored during session, Repositioned    Home Living                          Prior Function            PT Goals (current goals can now be found in the care plan section) Acute Rehab PT Goals Patient Stated Goal: decrease pain, return to PLOF PT Goal Formulation: With patient/family Time For Goal Achievement: 09/16/22 Potential to Achieve Goals: Good Progress towards PT goals: Progressing toward goals    Frequency    Min 3X/week      PT Plan Current plan remains appropriate    Co-evaluation              AM-PAC PT "6 Clicks" Mobility   Outcome Measure  Help needed turning from your back to your side while in a flat bed without using bedrails?: A Little Help needed moving from lying on your back to sitting on the side of a flat bed without using bedrails?: A Little Help needed moving to and from a bed to a chair (including a wheelchair)?: A Little Help needed standing up from a chair using your arms (e.g., wheelchair or bedside chair)?: A Little Help needed to walk in hospital room?: A Little Help needed climbing 3-5 steps with a railing? : A Lot 6 Click Score: 17    End of Session Equipment Utilized During Treatment: Gait belt Activity Tolerance: Patient limited by fatigue;Patient limited by pain Patient left: in chair;with call bell/phone within reach;with family/visitor present;with chair alarm set Nurse Communication: Mobility status PT Visit Diagnosis: Pain Pain - Right/Left:  Right Pain - part of body: Shoulder (bil feet, right hip)     Time: [id number] PT Time Calculation (min) (ACUTE ONLY): 24 min  Charges:  $Gait Training: 8-22 mins $Therapeutic Activity: 8-22 mins                     Charlynne Cousins, PT DPT Acute Rehabilitation Services Office [phone]    Luvenia Heller 09/03/2022, 4:03 PM

## 2022-09-03 NOTE — Discharge Instructions (Signed)
Hello! This is information for you to share with Dr. Casimiro Needle. Several of your medications were either held (stopped) or reduced by the medical team when you were admitted. This is a list of all of the medications that affect your brain you are on, and what was changed in your hospital stay. This list is as complete as I could make it. I chose not to put you back on any of the medications that were reduced or stopped, because when I saw you the tremor, balance, memory, and sleep were all doing quite well.   Abilify (aripiprazole) 2 mg daily - unchanged Cogentin (benztropine) 1 mg BID - unchanged Lexapro (escitalopram) 10 mg daily -- unchanged  Lyrica (pregabalin) 100 mg three times daily was reduced to two times daily Tegretol (carbamazepine) 100 mg daily was changed to oxcarb (oxcarbazepine) 150 mg twice daily Sinequan (doxepin_ 10 mg nightly  was stopped Belsomra (suvorexant) 20 mg nightly was stopped Ativan (lorazepam) was changed from 1 mg three times daily to 0.25 mg twice daily  As always, if you have suicidal thoughts or other psychiatric emergency please call 911 or go to the nearest ED.   Best, Dr. Lovette Cliche

## 2022-09-04 ENCOUNTER — Other Ambulatory Visit (HOSPITAL_COMMUNITY): Payer: Self-pay

## 2022-09-04 DIAGNOSIS — I10 Essential (primary) hypertension: Secondary | ICD-10-CM | POA: Diagnosis not present

## 2022-09-04 DIAGNOSIS — H8113 Benign paroxysmal vertigo, bilateral: Secondary | ICD-10-CM | POA: Diagnosis not present

## 2022-09-04 DIAGNOSIS — I959 Hypotension, unspecified: Secondary | ICD-10-CM | POA: Diagnosis not present

## 2022-09-04 DIAGNOSIS — I2510 Atherosclerotic heart disease of native coronary artery without angina pectoris: Secondary | ICD-10-CM | POA: Diagnosis not present

## 2022-09-04 LAB — GLUCOSE, CAPILLARY: Glucose-Capillary: 212 mg/dL — ABNORMAL HIGH (ref 70–99)

## 2022-09-04 MED ORDER — VITAMIN B-12 1000 MCG PO TABS
1000.0000 ug | ORAL_TABLET | Freq: Every day | ORAL | 1 refills | Status: AC
  Filled 2022-09-04: qty 30, 30d supply, fill #0

## 2022-09-04 MED ORDER — LANTUS SOLOSTAR 100 UNIT/ML ~~LOC~~ SOPN: 20.0000 [IU] | PEN_INJECTOR | Freq: Two times a day (BID) | SUBCUTANEOUS | 3 refills | Status: DC

## 2022-09-04 MED ORDER — METOPROLOL TARTRATE 50 MG PO TABS
50.0000 mg | ORAL_TABLET | Freq: Two times a day (BID) | ORAL | 1 refills | Status: DC
  Filled 2022-09-04: qty 60, 30d supply, fill #0

## 2022-09-04 MED ORDER — METOPROLOL TARTRATE 50 MG PO TABS
50.0000 mg | ORAL_TABLET | Freq: Two times a day (BID) | ORAL | Status: DC
  Administered 2022-09-04: 50 mg via ORAL
  Filled 2022-09-04: qty 1

## 2022-09-04 MED ORDER — LIDOCAINE 5 % EX PTCH
1.0000 | MEDICATED_PATCH | CUTANEOUS | Status: DC
  Administered 2022-09-04: 1 via TRANSDERMAL

## 2022-09-04 MED ORDER — LORAZEPAM 1 MG PO TABS: 0.5000 mg | ORAL_TABLET | Freq: Two times a day (BID) | ORAL | 0 refills | Status: DC

## 2022-09-04 MED ORDER — FUROSEMIDE 40 MG PO TABS
40.0000 mg | ORAL_TABLET | Freq: Every day | ORAL | 0 refills | Status: DC | PRN
  Filled 2022-09-04: qty 30, 30d supply, fill #0

## 2022-09-04 MED ORDER — PANTOPRAZOLE SODIUM 40 MG PO TBEC
40.0000 mg | DELAYED_RELEASE_TABLET | Freq: Every day | ORAL | 1 refills | Status: DC
  Filled 2022-09-04: qty 30, 30d supply, fill #0

## 2022-09-04 MED ORDER — LIDOCAINE 5 % EX PTCH
1.0000 | MEDICATED_PATCH | Freq: Two times a day (BID) | CUTANEOUS | 0 refills | Status: DC
  Filled 2022-09-04: qty 10, 5d supply, fill #0

## 2022-09-04 MED ORDER — PREGABALIN 100 MG PO CAPS
100.0000 mg | ORAL_CAPSULE | Freq: Two times a day (BID) | ORAL | 1 refills | Status: DC
  Filled 2022-09-04: qty 60, 30d supply, fill #0

## 2022-09-04 NOTE — Consult Note (Addendum)
[facility] New Face-to-Face Psychiatric Evaluation   Service Date: September 04, 2022 LOS:  LOS: 2 days    Assessment  Amber Stephenson is a 77 y.o. female admitted medically for 09/01/2022  9:19 AM for falls and hypotension. She carries the psychiatric diagnoses of bipolar disorder and PTSD and has a past medical history listed below. Psychiatry was consulted for polypharmacy by Dr. Sloan Leiter.    Her current presentation of frequent falls, memory issues, and sleep issues is most consistent with polypharmacy. She meets criteria for bipolar disorder based on history and clinical interview; last depressive episode about 1 year ago and last manic episode remote. She has had multiple hospitalizations and suicide attempts over the long course of her illness. Pt and husband at bedside feel that her outpatient regimen is working fairly well to "level her out"; and are hopeful that she will be able to find a balance between effective, necessary medications for continued remission from bipolar illness and minimizing medications contributing to frequent falls and memory loss.  Several medications were down-titrated or removed PRIOR to psychiatric consultation; these include doxepin, belsomra, lyrica, ativan, and tegretol (which was replaced by oxcarb). This was done thoughtfully, and I have no further medication changes I believe would be beneficial at this time - would want to see how pt tolerates these changes over a couple of weeks before making additional changes. We will not be making furhter medication changes; would consider seeing if she tolerated a taper of cogentin (which is quite anticholinergic) especially if she was on a higher potency dopamine blocker than abilify when it was started. If taper not tolerable, would consider substituting ingrezza.   On follow-up visit, patient has tolerated medication changes, she has mild anxiety, that she is able to cope with. Otherwise she's A&Ox4 and she  denied active and passive SI/HI, AVH, paranoia. She is getting discharged today, recommended follow-up with outpatient psychiatrist.  Diagnoses:  Active Hospital problems: Principal Problem:   Hypotension Active Problems:   Chronic back pain   Essential hypertension   Chronic diastolic heart failure   Diabetic neuropathy   OSA (obstructive sleep apnea)   Bipolar 1 disorder   Benign paroxysmal positional vertigo due to bilateral vestibular disorder   CAD (coronary artery disease)   Ambulatory dysfunction   Type II diabetes mellitus   Dyslipidemia   Acute metabolic encephalopathy   AKI (acute kidney injury)   Frequent falls    Plan  ## Safety and Observation Level:  - Based on my clinical evaluation, I estimate the patient to be at low risk of self harm in the current setting - At this time, we recommend a routine level of observation. This decision is based on my review of the chart including patient's history and current presentation, interview of the patient, mental status examination, and consideration of suicide risk including evaluating suicidal ideation, plan, intent, suicidal or self-harm behaviors, risk factors, and protective factors. This judgment is based on our ability to directly address suicide risk, implement suicide prevention strategies and develop a safety plan while the patient is in the clinical setting. Please contact our team if there is a concern that risk level has changed.   ## Medications:  -- continue current meds  Abilify 2 QHS Cogentin 1 BID Lexapro 10 qD Ativan 0.25 BID  Oxcarb 150 BID Pregabalin 100 BID  ## Medical Decision Making Capacity:  Not formally assessed  ## Further Work-up:  -- none currently, appreciate thorough workup by primary  team   -- most recent EKG on 4/3 had QtC of 435 in afib -- Pertinent labwork reviewed earlier this admission includes: TSH wnl  ## Disposition:  -- per primary   Thank you for this consult request.  Recommendations have been communicated to the primary team.  We will sign off at this time.   Merrily Brittle, DO PGY-2   Follow-up history  Relevant Aspects of Hospital Course:  Admitted on 09/01/2022 for frequent falls and hypotension.  Patient Report:  Patient seen in AM; husband remains at bedside with patient consent.  She is alert and A&Ox4.  She is able to do the days of the week backwards no errors. She was initially seen in street clothes, sitting on the edge of bed, awaiting discharge.   Patient reported that she feels much better, although some mild anxiety that is tolerable. Her sleep and appetite has been stable and appropriate, mildly tired. She denied other side effects. Still sore from fall. She and husband has no other questions and concerns.  She denied active and passive SI/HI, AVH, paranoia.  Review of Systems  Constitutional:  Negative for malaise/fatigue.  HENT:  Negative for congestion.   Respiratory:  Negative for shortness of breath.   Gastrointestinal:  Negative for abdominal pain, nausea and vomiting.  Musculoskeletal:        All over soreness from fall with R>L  Neurological:  Negative for dizziness, tremors and headaches.     Collateral information:  Husband present for interview on 4/3, 4/4  Psychiatric History:  Information collected from pt, medical record Index suicide attempt age 60, has been on some sort of benzodiazepine since then. Most of her manic episodes were between [place], has responded well to mood stabilizer since. Multiple lifetime psychiatric hospitalizations, only 1 in the last 10 to 15 years. Experienced some suicidal ideations about a year ago, these have gotten easier to deal with as  PTA meds Abilify (aripiprazole) 2 mg daily - unchanged Cogentin (benztropine) 1 mg BID - unchanged Lexapro (escitalopram) 10 mg daily -- unchanged  Lyrica (pregabalin) 100 mg three times daily was reduced to two times daily Tegretol  (carbamazepine) 100 mg daily was changed to oxcarb (oxcarbazepine) 150 mg twice daily Sinequan (doxepin_ 10 mg nightly  was stopped Belsomra (suvorexant) 20 mg nightly was stopped Ativan (lorazepam) was changed from 1 mg three times daily to 0.25 mg twice daily  Social History:  Lives with husband or 18 years, retired 3 kids, all over the [place] Socially active with church, dinner group every other week  Tobacco use: no Alcohol use: no Drug use: no  Family History:  The patient's family history includes Alcohol abuse in her sister; Alzheimer's disease in her father; Diabetes in her brother, mother, and sister; Heart disease in her brother, mother, and sister; Mental illness in her brother; Stroke in her brother.  Medical History: Past Medical History:  Diagnosis Date   Acquired hammer toe 12/06/2020   Allergic rhinitis 02/24/2019   Ambulatory dysfunction 04/23/2020   Bipolar 1 disorder 01/23/2015   with GAD, benzo dependence   BPPV (benign paroxysmal positional vertigo) 05/20/2019   CAD (coronary artery disease)    Nonobstructive; Managed by Dr. Bronson Ing   Chronic constipation 04/25/2020   Chronic diastolic heart failure [id number]   Chronic pain syndrome 08/22/2019   back pain, sacroiliitis   Chronic post-traumatic stress disorder (PTSD) 12/06/2020   Diabetic neuropathy 02/06/2016   Dyslipidemia 09/24/2020   Essential hypertension    Fibromyalgia  03/19/2017   Functional diarrhea 10/26/2020   Head trauma 09/17/2020   Hemorrhoids 03/11/2022   Herpes genitalis in women 07/16/2015   Insomnia 01/23/2015   Migraine headache with aura 02/12/2016   Mild intermittent asthma 01/12/2018   Myofascial pain dysfunction syndrome [id number]   Non-alcoholic fatty liver disease 01/12/2018   Opioid dependence 12/06/2020   OSA (obstructive sleep apnea) 02/24/2019   10/09/2018 - HST  - AHI 40.6    Osteopenia 12/06/2020   Rx alendronate 35 mg.   Pulmonary hypertension    RLS (restless  legs syndrome) 04/27/2015   Tremor, essential 12/11/2021   Type II diabetes mellitus 09/24/2020    Surgical History: Past Surgical History:  Procedure Laterality Date   BREAST REDUCTION SURGERY     EYE SURGERY Right    cateracts   HAMMER TOE SURGERY     LEFT HEART CATH AND CORONARY ANGIOGRAPHY N/A 02/03/2018   Procedure: LEFT HEART CATH AND CORONARY ANGIOGRAPHY;  Surgeon: Martinique, Peter M, MD;  Location: [facility];  Service: Cardiovascular;  Laterality: N/A;   REVERSE SHOULDER ARTHROPLASTY Right 08/19/2019   Procedure: REVERSE SHOULDER ARTHROPLASTY;  Surgeon: Netta Cedars, MD;  Location: [facility];  Service: Orthopedics;  Laterality: Right;  interscalene block   SHOULDER SURGERY Right    "I BROKE MY SHOUDLER   THIGH SURGERY     "TO REMOVE A TUMOR "    Medications:   Current Facility-Administered Medications:    acetaminophen (TYLENOL) tablet 650 mg, 650 mg, Oral, Q6H PRN, 650 mg at 09/02/22 1945 **OR** acetaminophen (TYLENOL) suppository 650 mg, 650 mg, Rectal, Q6H PRN, Kc, Ramesh, MD   albuterol (PROVENTIL) (2.5 MG/3ML) 0.083% nebulizer solution 2.5 mg, 2.5 mg, Nebulization, PRN, Kc, Ramesh, MD   alum & mag hydroxide-simeth (MAALOX/MYLANTA) 200-200-20 MG/5ML suspension 30 mL, 30 mL, Oral, Q6H PRN, Ghimire, Shanker M, MD, 30 mL at 09/03/22 1210   apixaban (ELIQUIS) tablet 5 mg, 5 mg, Oral, BID, Kc, Ramesh, MD, 5 mg at 09/04/22 1007   ARIPiprazole (ABILIFY) tablet 2 mg, 2 mg, Oral, QHS, Kc, Ramesh, MD, 2 mg at 09/03/22 2030   benztropine (COGENTIN) tablet 1 mg, 1 mg, Oral, BID, Kc, Ramesh, MD, 1 mg at 09/04/22 1006   cyanocobalamin (VITAMIN B12) injection 1,000 mcg, 1,000 mcg, Subcutaneous, Daily, Ghimire, Shanker M, MD, 1,000 mcg at 09/04/22 1011   escitalopram (LEXAPRO) tablet 10 mg, 10 mg, Oral, Daily, Kc, Ramesh, MD, 10 mg at 09/04/22 1007   guaiFENesin-dextromethorphan (ROBITUSSIN DM) 100-10 MG/5ML syrup 5 mL, 5 mL, Oral, Q4H PRN, Ghimire, Shanker M, MD, 5 mL at 09/04/22  0014   insulin aspart (novoLOG) injection 0-9 Units, 0-9 Units, Subcutaneous, TID WC, Ghimire, Henreitta Leber, MD, 3 Units at 09/04/22 1008   insulin glargine-yfgn (SEMGLEE) injection 15 Units, 15 Units, Subcutaneous, BID, Ghimire, Henreitta Leber, MD, 15 Units at 09/04/22 1008   lidocaine (LIDODERM) 5 % 1 patch, 1 patch, Transdermal, Q24H, Ghimire, Henreitta Leber, MD, 1 patch at 09/04/22 1056   LORazepam (ATIVAN) tablet 0.25 mg, 0.25 mg, Oral, BID, Kc, Ramesh, MD, 0.25 mg at 09/04/22 1008   metoprolol tartrate (LOPRESSOR) tablet 50 mg, 50 mg, Oral, BID, Ghimire, Shanker M, MD, 50 mg at 09/04/22 1007   mometasone-formoterol (DULERA) 200-5 MCG/ACT inhaler 2 puff, 2 puff, Inhalation, BID, Kc, Ramesh, MD, 2 puff at 09/04/22 0833   morphine (MS CONTIN) 12 hr tablet 15 mg, 15 mg, Oral, Q12H, Mansy, Jan A, MD, 15 mg at 09/04/22 1007   ondansetron (ZOFRAN) tablet 4 mg,  4 mg, Oral, Q6H PRN **OR** ondansetron (ZOFRAN) injection 4 mg, 4 mg, Intravenous, Q6H PRN, Kc, Ramesh, MD   OXcarbazepine (TRILEPTAL) tablet 150 mg, 150 mg, Oral, BID, Kc, Ramesh, MD, 150 mg at 09/04/22 1007   pantoprazole (PROTONIX) EC tablet 40 mg, 40 mg, Oral, Q1200, Ghimire, Henreitta Leber, MD, 40 mg at 09/03/22 1248   phenol (CHLORASEPTIC) mouth spray 1 spray, 1 spray, Mouth/Throat, PRN, Ghimire, Henreitta Leber, MD, 1 spray at 09/04/22 0014   pregabalin (LYRICA) capsule 100 mg, 100 mg, Oral, BID, Ghimire, Henreitta Leber, MD, 100 mg at 09/04/22 1007  Facility-Administered Medications Ordered in Other Encounters:    bupivacaine-epinephrine (MARCAINE W/ EPI) 0.5% -1:200000 injection, , , Anesthesia Intra-op, Effie Berkshire, MD, 12 mL at 01/21/19 0935  Allergies: Allergies  Allergen Reactions   Iodine Anaphylaxis   Ivp Dye [Iodinated Contrast Media] Anaphylaxis and Swelling    Throat closes    Latex Other (See Comments)    Latex tape pulls skin with it   Tizanidine Other (See Comments)    Weakness, goofy, bad dreams       Objective  Vital signs:  Temp:   [97.9 F (36.6 C)-98.4 F (36.9 C)] 98.2 F (36.8 C) (04/04 0755) Pulse Rate:  [68-84] 84 (04/04 0755) Resp:  [11-23] 15 (04/04 0755) BP: (115-146)/(73-81) 122/76 (04/04 0755) SpO2:  [89 %-100 %] 93 % (04/04 0755)  Psychiatric Specialty Exam:  Presentation  General Appearance: Appropriate for Environment  Eye Contact:Good  Speech:Clear and Coherent  Speech Volume:Normal  Handedness:right  Mood and Affect  Mood:-- (Good, well rested)  Affect:Congruent; Full Range   Thought Process  Thought Processes:Coherent; Goal Directed  Descriptions of Associations:Intact  Orientation:Full (Time, Place and Person)  Thought Content:-- (devoid of delusions, paranoia)  History of Schizophrenia/Schizoaffective disorder:NA Duration of Psychotic Symptoms:NA Hallucinations:Hallucinations: None  Ideas of Reference:None  Suicidal Thoughts:Suicidal Thoughts: No  Homicidal Thoughts:Homicidal Thoughts: No   Sensorium  Memory:Immediate Fair; Recent Poor; Remote Good  Judgment:Fair  Insight:Fair   Executive Functions  Concentration:Good Attention Span:Good  [place] of Knowledge:Good  Language:Good   Psychomotor Activity  Psychomotor Activity:Psychomotor Activity: Normal   Assets  Assets:Desire for Improvement; Housing; Intimacy; Leisure Time; Social Support   Sleep  Sleep:Sleep: Good Number of Hours of Sleep: 7    Physical Exam: Physical Exam Vitals and nursing note reviewed.  Constitutional:      General: She is not in acute distress.    Appearance: She is not ill-appearing, toxic-appearing or diaphoretic.  HENT:     Head: Normocephalic.     Nose: No congestion.  Pulmonary:     Effort: Pulmonary effort is normal. No respiratory distress.  Neurological:     Mental Status: She is alert and oriented to person, place, and time.     Blood pressure 122/76, pulse 84, temperature 98.2 F (36.8 C), temperature source Oral, resp. rate 15, weight  83.5 kg, SpO2 93 %. Body mass index is 33.65 kg/m.

## 2022-09-04 NOTE — Progress Notes (Signed)
PT Cancellation Note  Patient Details Name: Amber Stephenson MRN: [id number] DOB: August 19, 1945   Cancelled Treatment:    Reason Eval/Treat Not Completed: (P) Patient declined, no reason specified, pt declining all mobility stating "this is not a good time".  Will check back as schedule allows to continue with PT POC.  Audry Riles. PTA Acute Rehabilitation Services Office: [place] 09/04/2022, 10:43 AM

## 2022-09-04 NOTE — Discharge Summary (Addendum)
PATIENT DETAILS Name: Amber Stephenson Age: 77 y.o. Sex: female Date of Birth: 04-17-1946 MRN: [id number]. Admitting Physician: Karmen Bongo, MD [id number], Cletus Gash, MD  Admit Date: 09/01/2022 Discharge date: 09/04/2022  Recommendations for Outpatient Follow-up:  Follow up with PCP in 1-2 weeks Please obtain CMP/CBC in one week PCP/psychiatry to minimize/taper down benzo/narcotics and other psych meds.  Patient's presentation this time is likely related to polypharmacy. Check vitamin B12 levels in 6 weeks  Admitted From:  Home  Disposition: Home health   Discharge Condition: good  CODE STATUS:   Code Status: Full Code   Diet recommendation:  Diet Order             Diet - low sodium heart healthy           Diet Carb Modified           Diet heart healthy/carb modified Room service appropriate? Yes; Fluid consistency: Thin  Diet effective now                    Brief Summary: Patient is a 77 y.o.  female HTN, DM-2, COPD, A-fib, chronic HFpEF, chronic pain syndrome, restless leg syndrome, bipolar disorder who presented to the hospital with unsteady gait, frequent falls-was found to be hypotensive-there was concern for sepsis physiology-and hence patient was admitted to the hospitalist service.  See below for further details.   Significant events: 4/1>> admit to [facility]   Significant studies: 4/1>> CT head: No acute intracranial abnormality 4/1>> CT C-spine: No cervical spine injury 4/1>> CT maxillofacial: No facial fracture 4/1>> CT chest: No acute findings-no PNA 4/1>> abdominal ultrasound: No acute abnormality identified 4/2>> EEG: No seizures 4/2>> echo: EF 65-70%.   Significant microbiology data: 4/1>> blood culture: Negative 4/1>> UA: No evidence of UTI   Procedures: None none   Consults: PCCM  Brief Hospital Course: Hypotension Acute toxic metabolic encephalopathy Frequent falls Felt to be related to polypharmacy/antihypertensive/diuretic  use-although sepsis was contemplated on admission-it is now felt to be unlikely (sepsis ruled out)  Clinical improvement continues-being monitored off antibiotics since 4/2.   BP much better with just supportive care.   Polypharmacy Reviewed home medication list-on benzos/narcotics/psychotropic medications Significantly better-completely awake/alert Some of these medications will need slow titration in the outpatient setting Appreciate psychiatry input-see below.   AKI Mild Suspect hemodynamically mediated kidney injury Resolved with IVF titration.   Chest pain on 4/3 morning Atypical-per patient-feels like gas-this morning-some minimal right-sided chest pain-clearly musculoskeletal-easily reproduced by gentle palpation EKG/troponin negative Continue PPI and other supportive care.  HTN BP improving-and now on the higher side Increasing dosage of metoprolol-resume lisinopril on discharge.   Chronic atrial fibrillation Rate controlled with beta-blocker Continue Eliquis.    Chronic HFpEF Euvolemic Beta-blocker restarted 4/3 Suspect can make diuretics as needed for weight gain.    HLD Statin   DM-2 (A1c 6.8 on 12/27) CBGs remain on the higher side-will increase Lantus to 20 units twice daily.  Patient can slowly increase her regimen to her home dose over the next several days. Resume all oral hypoglycemics on discharge.     Vitamin B12 deficiency Begin supplementation   OSA intolerant to CPAP-on nocturnal home O2 Bronchial asthma Stable-continue bronchodilators/nocturnal O2 as needed   Chronic pain syndrome Chronic back pain Has been tolerating usual narcotic regimen in the hospital without any issues. Will need PCP to consider titrating down narcotics slowly in the outpatient setting.   Restless leg syndrome Bipolar disorder Anxiety disorder Peripheral neuropathy  Continue to Trileptal/Lexapro/Abilify/Cogentin (dosage has not been adjusted) On reduced dose  lorazepam/Lyrica currently (current dosage adjusted during this hospitalization) Hydroxyzine discontinued Psychiatry evaluated during this hospitalization-medication dosage adjusted slightly-further titration will need to be done in the outpatient setting.   Obesity: Estimated body mass index is 33.65 kg/m as calculated from the following:   Height as of 08/29/22: 5\' 2"  (1.575 m).   Weight as of this encounter: 83.5 kg    Discharge Diagnoses:  Principal Problem:   Hypotension Active Problems:   Chronic diastolic heart failure   Chronic back pain   Essential hypertension   Diabetic neuropathy   OSA (obstructive sleep apnea)   Bipolar 1 disorder   Benign paroxysmal positional vertigo due to bilateral vestibular disorder   CAD (coronary artery disease)   Ambulatory dysfunction   Type II diabetes mellitus   Dyslipidemia   Acute metabolic encephalopathy   AKI (acute kidney injury)   Frequent falls   Discharge Instructions:  Activity:  As tolerated with Full fall precautions use walker/cane & assistance as needed  Discharge Instructions     Call MD for:  extreme fatigue   Complete by: As directed    Call MD for:  persistant dizziness or light-headedness   Complete by: As directed    Call MD for:  persistant nausea and vomiting   Complete by: As directed    Diet - low sodium heart healthy   Complete by: As directed    Diet Carb Modified   Complete by: As directed    Discharge instructions   Complete by: As directed    Follow with Primary MD  [name], MD in 1-2 weeks  Follow-up with your primary psychiatrist in the next 1-2 weeks.  Please ask your primary care practitioner and primary psychiatrist to slowly titrate down some of his psychiatric medications and pain medications.  Your presentation during this hospitalization was more consistent with polypharmacy.  There have been some medication changes-we have decreased the dose of lorazepam and Lyrica.  You were  found to have low vitamin B12 levels, you got several days of injections in the hospital, you will be switched to a oral vitamin B12 replacement on discharge.  Please ask your primary care practitioner to repeat vitamin B12 levels in 6 weeks.  Please get a complete blood count and chemistry panel checked by your Primary MD at your next visit, and again as instructed by your Primary MD.  Get Medicines reviewed and adjusted: Please take all your medications with you for your next visit with your Primary MD  Laboratory/radiological data: Please request your Primary MD to go over all hospital tests and procedure/radiological results at the follow up, please ask your Primary MD to get all Hospital records sent to his/her office.  In some cases, they will be blood work, cultures and biopsy results pending at the time of your discharge. Please request that your primary care M.D. follows up on these results.  Also Note the following: If you experience worsening of your admission symptoms, develop shortness of breath, life threatening emergency, suicidal or homicidal thoughts you must seek medical attention immediately by calling 911 or calling your MD immediately  if symptoms less severe.  You must read complete instructions/literature along with all the possible adverse reactions/side effects for all the Medicines you take and that have been prescribed to you. Take any new Medicines after you have completely understood and accpet all the possible adverse reactions/side effects.   Do not drive when  taking Pain medications or sleeping medications (Benzodaizepines)  Do not take more than prescribed Pain, Sleep and Anxiety Medications. It is not advisable to combine anxiety,sleep and pain medications without talking with your primary care practitioner  Special Instructions: If you have smoked or chewed Tobacco  in the last 2 yrs please stop smoking, stop any regular Alcohol  and or any Recreational drug  use.  Wear Seat belts while driving.  Please note: You were cared for by a hospitalist during your hospital stay. Once you are discharged, your primary care physician will handle any further medical issues. Please note that NO REFILLS for any discharge medications will be authorized once you are discharged, as it is imperative that you return to your primary care physician (or establish a relationship with a primary care physician if you do not have one) for your post hospital discharge needs so that they can reassess your need for medications and monitor your lab values.   Increase activity slowly   Complete by: As directed       Allergies as of 09/04/2022       Reactions   Iodine Anaphylaxis   Ivp Dye [iodinated Contrast Media] Anaphylaxis, Swelling   Throat closes   Latex Other (See Comments)   Latex tape pulls skin with it   Tizanidine Other (See Comments)   Weakness, goofy, bad dreams        Medication List     STOP taking these medications    fluconazole 150 MG tablet Commonly known as: DIFLUCAN   hydrOXYzine 25 MG capsule Commonly known as: VISTARIL   metoprolol succinate 100 MG 24 hr tablet Commonly known as: TOPROL-XL   metoprolol succinate 25 MG 24 hr tablet Commonly known as: Toprol XL   potassium chloride SA 20 MEQ tablet Commonly known as: KLOR-CON M       TAKE these medications    albuterol (2.5 MG/3ML) 0.083% nebulizer solution Commonly known as: PROVENTIL Take 3 mLs (2.5 mg total) by nebulization every 6 (six) hours as needed for wheezing or shortness of breath. What changed: when to take this   albuterol 108 (90 Base) MCG/ACT inhaler Commonly known as: VENTOLIN HFA Inhale 2 puffs into the lungs every 6 (six) hours as needed for wheezing or shortness of breath. What changed: when to take this   ARIPiprazole 2 MG tablet Commonly known as: ABILIFY Take 2 mg by mouth at bedtime.   atorvastatin 40 MG tablet Commonly known as: LIPITOR TAKE 1  TABLET(40 MG) BY MOUTH DAILY (NEEDS TO BE SEEN BEFORE NEXT REFILL)   benztropine 1 MG tablet Commonly known as: COGENTIN TAKE 1 TABLET(1 MG) BY MOUTH TWICE DAILY   colchicine 0.6 MG tablet Take 1 tablet (0.6 mg total) by mouth 2 (two) times daily as needed.   cyanocobalamin 1000 MCG tablet Commonly known as: VITAMIN B12 Take 1 tablet (1,000 mcg total) by mouth daily.   Dexcom G6 Transmitter Misc Use as instructed to check blood sugar. Change every 90 days   Dexcom G7 Sensor Misc Change sensor every 10 days   Eliquis 5 MG Tabs tablet Generic drug: apixaban TAKE 1 TABLET TWICE A DAY   Emgality 120 MG/ML Soaj Generic drug: Galcanezumab-gnlm INJECT CONTENTS OF 1 PEN INTO THE SKIN ONCE MONTHLY   escitalopram 10 MG tablet Commonly known as: LEXAPRO Take 10 mg by mouth daily.   Fluticasone-Salmeterol 500-50 MCG/DOSE Aepb Commonly known as: Advair Diskus Inhale 1 puff into the lungs in the morning and at bedtime.  What changed:  when to take this reasons to take this   FREESTYLE LITE test strip Generic drug: glucose blood Check blood sugar 2-3 weekly   furosemide 40 MG tablet Commonly known as: LASIX Take 1 tablet (40 mg total) by mouth daily as needed. If dyspnea, weight gain of 3 lbs or greater, or edema. What changed:  how much to take when to take this reasons to take this additional instructions   icosapent Ethyl 1 g capsule Commonly known as: VASCEPA TAKE 2 CAPSULES TWICE A DAY WITH MEALS   insulin lispro 100 UNIT/ML KwikPen Commonly known as: HumaLOG KwikPen Max daily 50 units What changed:  how much to take how to take this when to take this additional instructions   Insulin Pen Needle 29G X 12MM Misc 1 Device by Does not apply route daily in the afternoon.   Lantus SoloStar 100 UNIT/ML Solostar Pen Generic drug: insulin glargine Inject 20 Units into the skin 2 (two) times daily. What changed:  how much to take when to take this   lidocaine 5  % Commonly known as: Lidoderm Place 1 patch onto the skin every 12 (twelve) hours. Remove & Discard patch within 12 hours or as directed by MD   lisinopril 10 MG tablet Commonly known as: ZESTRIL Take 1 tablet (10 mg total) by mouth daily.   LORazepam 1 MG tablet Commonly known as: ATIVAN Take 0.5 tablets (0.5 mg total) by mouth 2 (two) times daily. What changed:  how much to take additional instructions   metFORMIN 500 MG tablet Commonly known as: GLUCOPHAGE Take 1 tablet (500 mg total) by mouth 2 (two) times daily with a meal. TAKE 1 TABLET(500 MG) BY MOUTH TWICE DAILY AFTER A MEAL What changed: additional instructions   metoprolol tartrate 50 MG tablet Commonly known as: LOPRESSOR Take 1 tablet (50 mg total) by mouth 2 (two) times daily.   morphine 15 MG 12 hr tablet Commonly known as: MS Contin Take 1 tablet (15 mg total) by mouth every 12 (twelve) hours. This is a long acting form, so take 1 tab 2x/day to keep pain more even- it is NOT a short acting pain med like you were taking- for chronic pain   Mounjaro 5 MG/0.5ML Pen Generic drug: tirzepatide Inject 5 mg into the skin once a week.   naloxone 4 MG/0.1ML Liqd nasal spray kit Commonly known as: NARCAN Place 1 spray into the nose once.   nystatin ointment Commonly known as: MYCOSTATIN Apply 1 Application topically 2 (two) times daily. What changed:  when to take this reasons to take this   nystatin powder Commonly known as: MYCOSTATIN/NYSTOP Apply 1 Application topically as needed (rash). What changed: Another medication with the same name was changed. Make sure you understand how and when to take each.   ondansetron 8 MG disintegrating tablet Commonly known as: ZOFRAN-ODT Take 1 tablet (8 mg total) by mouth every 6 (six) hours as needed for nausea or vomiting.   OXcarbazepine 150 MG tablet Commonly known as: TRILEPTAL Take 1 tablet (150 mg total) by mouth 2 (two) times daily.   pantoprazole 40 MG  tablet Commonly known as: PROTONIX Take 1 tablet (40 mg total) by mouth daily at 12 noon.   pregabalin 100 MG capsule Commonly known as: LYRICA Take 1 capsule (100 mg total) by mouth 2 (two) times daily. What changed:  medication strength See the new instructions.   Prolia 60 MG/ML Sosy injection Generic drug: denosumab Inject 60 mg into  the skin every 6 (six) months.   senna-docusate 8.6-50 MG tablet Commonly known as: Senokot-S Take 1 tablet by mouth at bedtime.   TYLENOL PO Take 2 tablets by mouth as needed (pain/headache).   Vitamin D (Cholecalciferol) 25 MCG (1000 UT) Tabs Take 1,000 Units by mouth daily in the afternoon.        Follow-up Information     Stacks, Cletus Gash, MD. Schedule an appointment as soon as possible for a visit in 1 week(s).   Specialty: Family Medicine Contact information: [place] [address] [phone]         [facility] Follow up.   Specialty: Home Health Services Contact information: [place] [address] [phone]                Allergies  Allergen Reactions   Iodine Anaphylaxis   Ivp Dye [Iodinated Contrast Media] Anaphylaxis and Swelling    Throat closes    Latex Other (See Comments)    Latex tape pulls skin with it   Tizanidine Other (See Comments)    Weakness, goofy, bad dreams     Other Procedures/Studies: ECHOCARDIOGRAM LIMITED  Result Date: 09/02/2022    ECHOCARDIOGRAM LIMITED REPORT   Patient Name:   Amber Stephenson Szabo Date of Exam: 09/02/2022 Medical Rec #:  [id number]         Height:       62.0 in Accession #:    [id number]        Weight:       184.0 lb Date of Birth:  18-Oct-1945          BSA:          1.845 m Patient Age:    75 years          BP:           129/77 mmHg Patient Gender: F                 HR:           93 bpm. Exam Location:  Inpatient Procedure: Limited Echo, Cardiac Doppler and Color Doppler Indications:    R55 Syncope  History:         Patient has prior history of Echocardiogram examinations, most                 recent 06/09/2022. CHF, CAD, Abnormal ECG, Arrythmias:Atrial                 Fibrillation; Risk Factors:Dyslipidemia, Diabetes and                 Hypertension.  Sonographer:    Roseanna Rainbow RDCS Referring Phys: [id number] [facility]  Sonographer Comments: Technically difficult study due to poor echo windows and patient is obese. Image acquisition challenging due to patient body habitus. IMPRESSIONS  1. Left ventricular ejection fraction, by estimation, is 65 to 70%. The left ventricle has normal function. Left ventricular diastolic parameters are indeterminate.  2. The right ventricular size is mildly enlarged.  3. Left atrial size was severely dilated.  4. The mitral valve is degenerative. Moderate mitral annular calcification.  5. The aortic valve is tricuspid. Aortic valve regurgitation is trivial.  6. The inferior vena cava is dilated in size with <50% respiratory variability, suggesting right atrial pressure of 15 mmHg. Comparison(s): Prior images reviewed side by side. Limited study, IVC is dilated from prior. FINDINGS  Left Ventricle: Left ventricular ejection fraction, by  estimation, is 65 to 70%. The left ventricle has normal function. Left ventricular diastolic parameters are indeterminate. Right Ventricle: The right ventricular size is mildly enlarged. Left Atrium: Left atrial size was severely dilated. Pericardium: Trivial pericardial effusion is present. The pericardial effusion is circumferential. Mitral Valve: The mitral valve is degenerative in appearance. Moderate mitral annular calcification. MV peak gradient, 8.0 mmHg. The mean mitral valve gradient is 3.0 mmHg. Tricuspid Valve: The tricuspid valve is not well visualized. Tricuspid valve regurgitation is not demonstrated. Aortic Valve: The aortic valve is tricuspid. Aortic valve regurgitation is trivial. Aorta: The aortic root and ascending aorta are structurally normal, with  no evidence of dilitation. Venous: The inferior vena cava is dilated in size with less than 50% respiratory variability, suggesting right atrial pressure of 15 mmHg. Additional Comments: Spectral Doppler performed. Color Doppler performed.  LEFT VENTRICLE PLAX 2D LVIDd:         3.20 cm LVIDs:         2.10 cm LV PW:         1.70 cm LV IVS:        1.30 cm  IVC IVC diam: 2.80 cm  AORTA Ao Asc diam: 3.80 cm MITRAL VALVE MV Peak grad: 8.0 mmHg MV Mean grad: 3.0 mmHg MV Vmax:      1.41 m/s MV Vmean:     77.0 cm/s Rudean Haskell MD Electronically signed by Rudean Haskell MD Signature Date/Time: 09/02/2022/4:45:38 PM    Final    EEG adult  Result Date: 09/02/2022 Lora Havens, MD     09/02/2022  9:04 AM Patient Name: Amber Stephenson MRN: [id number] Epilepsy Attending: Lora Havens Referring Physician/Provider: Antonieta Pert, MD Date: 09/01/2022 Duration: 21.48 mins Patient history: 77yo F with ams. EEG to evaluate for seizure. Level of alertness: Awake AEDs during EEG study: None Technical aspects: This EEG study was done with scalp electrodes positioned according to the 10-20 International system of electrode placement. Electrical activity was reviewed with band pass filter of 1-70Hz , sensitivity of 7 uV/mm, display speed of 1mm/sec with a 60Hz  notched filter applied as appropriate. EEG data were recorded continuously and digitally stored.  Video monitoring was available and reviewed as appropriate. Description: The posterior dominant rhythm consists of 6 Hz activity of moderate voltage (25-35 uV) seen predominantly in posterior head regions, symmetric and reactive to eye opening and eye closing. EEG showed continuous generalized 3 to 6 Hz theta-delta slowing. Hyperventilation and photic stimulation were not performed.   ABNORMALITY - Continuous slow, generalized IMPRESSION: This study is suggestive of moderate diffuse encephalopathy, nonspecific etiology. No seizures or epileptiform discharges were seen  throughout the recording. Priyanka Barbra Sarks   US Abdomen Complete  Result Date: 09/01/2022 CLINICAL DATA:  Unspecified abdominal pain EXAM: ABDOMEN ULTRASOUND COMPLETE COMPARISON:  None Available. FINDINGS: Gallbladder: No gallstones or wall thickening visualized. No sonographic Murphy sign noted by sonographer. Common bile duct: Diameter: 2-3 mm in proximal diameter. Liver: Hepatic parenchymal echogenicity is diffusely increased and there is coarsening of the hepatic echotexture in keeping with mild hepatic steatosis. No focal intrahepatic masses are seen and there is no intrahepatic biliary ductal dilation. Portal vein is patent on color Doppler imaging with normal direction of blood flow towards the liver. IVC: No abnormality visualized. Pancreas: Obscured by overlying bowel gas Spleen: Not visualized due to limited patient mobility Right Kidney: Length: 11.7 cm. Echogenicity within normal limits. No mass or hydronephrosis visualized. Left Kidney: Not visualized due to limited patient mobility Abdominal aorta:  Obscured by overlying bowel gas Other findings: None. IMPRESSION: 1. Limited examination with nonvisualization of the spleen and left kidney. No acute abnormality identified. 2. Mild hepatic steatosis. Electronically Signed   By: Fidela Salisbury M.D.   On: 09/01/2022 19:52   CT Chest Wo Contrast  Result Date: 09/01/2022 CLINICAL DATA:  Patient found lying beside the bed yesterday afternoon. Abrasion to the right face and year. Multiple sites of pain including the right arm and chest. EXAM: CT CHEST WITHOUT CONTRAST TECHNIQUE: Multidetector CT imaging of the chest was performed following the standard protocol without IV contrast. RADIATION DOSE REDUCTION: This exam was performed according to the departmental dose-optimization program which includes automated exposure control, adjustment of the mA and/or kV according to patient size and/or use of iterative reconstruction technique. COMPARISON:   06/28/2021. FINDINGS: Cardiovascular: Heart normal in size. No pericardial effusion. Mitral valve annular calcifications. Mild left coronary artery and circumflex coronary artery calcifications. Great vessels are normal in caliber. Mild aortic atherosclerosis. Mediastinum/Nodes: No neck base, mediastinal or hilar masses. No enlarged lymph nodes. Trachea and esophagus are unremarkable. Lungs/Pleura: No lung consolidation. No evidence of edema. Interstitial thickening most evident in the peripheral right upper lobe and lung bases. There are reticulonodular opacities peripherally in the right upper lobe with numerous calcified granuloma. Additional small calcified granuloma are seen in all remaining lobes. Findings are similar to the prior CT. No pleural effusion or pneumothorax. Upper Abdomen: Decreased attenuation of the liver consistent with fatty infiltration. Stable 9 mm left adrenal nodule consistent with an adenoma. No follow-up recommended. Musculoskeletal: No acute fracture or acute finding. No bone lesion. Partly imaged right shoulder reverse prosthesis appears well seated. IMPRESSION: 1. No acute findings.  No evidence of pneumonia. 2. Chronic lung findings as detailed stable from the prior CT. 3. Mild aortic atherosclerosis.  Coronary artery calcifications. 4. Hepatic steatosis. Aortic Atherosclerosis (ICD10-I70.0). Electronically Signed   By: Lajean Manes M.D.   On: 09/01/2022 11:35   CT Head Wo Contrast  Result Date: 09/01/2022 CLINICAL DATA:  Blunt facial trauma EXAM: CT HEAD WITHOUT CONTRAST CT MAXILLOFACIAL WITHOUT CONTRAST CT CERVICAL SPINE WITHOUT CONTRAST TECHNIQUE: Multidetector CT imaging of the head, cervical spine, and maxillofacial structures were performed using the standard protocol without intravenous contrast. Multiplanar CT image reconstructions of the cervical spine and maxillofacial structures were also generated. RADIATION DOSE REDUCTION: This exam was performed according to the  departmental dose-optimization program which includes automated exposure control, adjustment of the mA and/or kV according to patient size and/or use of iterative reconstruction technique. COMPARISON:  Head CT 08/16/2022 FINDINGS: CT HEAD FINDINGS Brain: No evidence of acute infarction, hemorrhage, hydrocephalus, extra-axial collection or mass lesion/mass effect. Vascular: No hyperdense vessel or unexpected calcification. Skull: Normal. Negative for fracture or focal lesion. CT MAXILLOFACIAL FINDINGS Osseous: No fracture or mandibular dislocation. No destructive process. Orbits: No evidence of injury Sinuses: Negative for hemosinus Soft tissues: No hematoma or foreign body seen. CT CERVICAL SPINE FINDINGS Alignment: No traumatic malalignment Skull base and vertebrae: No acute fracture. No primary bone lesion or focal pathologic process. Soft tissues and spinal canal: No prevertebral fluid or swelling. No visible canal hematoma. Disc levels: Ordinary cervical spine degeneration with endplate and facet spurring. Upper chest: Negative IMPRESSION: No evidence of intracranial or cervical spine injury. Negative for facial fracture. Electronically Signed   By: Jorje Guild M.D.   On: 09/01/2022 11:30   CT Cervical Spine Wo Contrast  Result Date: 09/01/2022 CLINICAL DATA:  Blunt facial trauma EXAM: CT  HEAD WITHOUT CONTRAST CT MAXILLOFACIAL WITHOUT CONTRAST CT CERVICAL SPINE WITHOUT CONTRAST TECHNIQUE: Multidetector CT imaging of the head, cervical spine, and maxillofacial structures were performed using the standard protocol without intravenous contrast. Multiplanar CT image reconstructions of the cervical spine and maxillofacial structures were also generated. RADIATION DOSE REDUCTION: This exam was performed according to the departmental dose-optimization program which includes automated exposure control, adjustment of the mA and/or kV according to patient size and/or use of iterative reconstruction technique.  COMPARISON:  Head CT 08/16/2022 FINDINGS: CT HEAD FINDINGS Brain: No evidence of acute infarction, hemorrhage, hydrocephalus, extra-axial collection or mass lesion/mass effect. Vascular: No hyperdense vessel or unexpected calcification. Skull: Normal. Negative for fracture or focal lesion. CT MAXILLOFACIAL FINDINGS Osseous: No fracture or mandibular dislocation. No destructive process. Orbits: No evidence of injury Sinuses: Negative for hemosinus Soft tissues: No hematoma or foreign body seen. CT CERVICAL SPINE FINDINGS Alignment: No traumatic malalignment Skull base and vertebrae: No acute fracture. No primary bone lesion or focal pathologic process. Soft tissues and spinal canal: No prevertebral fluid or swelling. No visible canal hematoma. Disc levels: Ordinary cervical spine degeneration with endplate and facet spurring. Upper chest: Negative IMPRESSION: No evidence of intracranial or cervical spine injury. Negative for facial fracture. Electronically Signed   By: Jorje Guild M.D.   On: 09/01/2022 11:30   CT Maxillofacial Wo Contrast  Result Date: 09/01/2022 CLINICAL DATA:  Blunt facial trauma EXAM: CT HEAD WITHOUT CONTRAST CT MAXILLOFACIAL WITHOUT CONTRAST CT CERVICAL SPINE WITHOUT CONTRAST TECHNIQUE: Multidetector CT imaging of the head, cervical spine, and maxillofacial structures were performed using the standard protocol without intravenous contrast. Multiplanar CT image reconstructions of the cervical spine and maxillofacial structures were also generated. RADIATION DOSE REDUCTION: This exam was performed according to the departmental dose-optimization program which includes automated exposure control, adjustment of the mA and/or kV according to patient size and/or use of iterative reconstruction technique. COMPARISON:  Head CT 08/16/2022 FINDINGS: CT HEAD FINDINGS Brain: No evidence of acute infarction, hemorrhage, hydrocephalus, extra-axial collection or mass lesion/mass effect. Vascular: No  hyperdense vessel or unexpected calcification. Skull: Normal. Negative for fracture or focal lesion. CT MAXILLOFACIAL FINDINGS Osseous: No fracture or mandibular dislocation. No destructive process. Orbits: No evidence of injury Sinuses: Negative for hemosinus Soft tissues: No hematoma or foreign body seen. CT CERVICAL SPINE FINDINGS Alignment: No traumatic malalignment Skull base and vertebrae: No acute fracture. No primary bone lesion or focal pathologic process. Soft tissues and spinal canal: No prevertebral fluid or swelling. No visible canal hematoma. Disc levels: Ordinary cervical spine degeneration with endplate and facet spurring. Upper chest: Negative IMPRESSION: No evidence of intracranial or cervical spine injury. Negative for facial fracture. Electronically Signed   By: Jorje Guild M.D.   On: 09/01/2022 11:30   DG Chest Port 1 View  Result Date: 09/01/2022 CLINICAL DATA:  Fall with hypotension EXAM: PORTABLE CHEST 1 VIEW COMPARISON:  08/29/2022 FINDINGS: Cardiomegaly. Bulky mitral annular calcification. Blunting at the lateral left costophrenic sulcus is from mediastinal fat. No edema, effusion, or convincing infiltrate. IMPRESSION: No acute finding when compared to prior. Electronically Signed   By: Jorje Guild M.D.   On: 09/01/2022 10:58   DG Thoracic Spine 2 View  Result Date: 08/29/2022 CLINICAL DATA:  pain, fall EXAM: THORACIC SPINE 2 VIEWS COMPARISON:  CT 11/11/2021 FINDINGS: There are chronic compression deformities of T1, T9, and T11. Unchanged superior endplate Schmorl's node of T12. There is no evidence of new thoracic spine fracture radiographically. Slight dextroconvex lower thoracic curvature.  Mild-to-moderate degenerative changes of the thoracic spine. IMPRESSION: Unchanged chronic compression deformities of T1, T9 and T11. No evidence of new thoracic spine fracture. Mild to moderate degenerative changes of the thoracic spine. Electronically Signed   By: Maurine Simmering M.D.    On: 08/29/2022 11:18   DG Lumbar Spine Complete  Result Date: 08/29/2022 CLINICAL DATA:  fall, pain EXAM: LUMBAR SPINE - COMPLETE 4+ VIEW COMPARISON:  CT 11/11/2021 FINDINGS: Levoconvex curvature. There is a chronic compression fracture of L2, with up to 20% height loss, unchanged. There is no evidence of new lumbar spine fracture. Unchanged mild T11 compression deformity. There is mild to moderate multilevel degenerative disc disease, worst at L3-L4 and L4-L5. There is trace degenerative retrolisthesis at L4-L5 and anterolisthesis at L5-S1. There is moderate-severe lower lumbar predominant facet arthropathy. IMPRESSION: Chronic compression deformity of L2 without evidence of progressive height loss. No evidence of new lumbar spine fracture. Mild to moderate multilevel degenerative disc disease, worst at L3-L4 and L4-L5. Moderate-severe lower lumbar predominant facet arthropathy. Electronically Signed   By: Maurine Simmering M.D.   On: 08/29/2022 11:13   DG Ribs Unilateral W/Chest Right  Result Date: 08/29/2022 CLINICAL DATA:  Fall.  Rib pain. EXAM: RIGHT RIBS AND CHEST - 3+ VIEW COMPARISON:  Chest radiograph 06/29/2022 FINDINGS: No displaced rib fractures identified. Heart size and mediastinal contours are unremarkable. Mild chronic interstitial coarsening identified bilaterally. No superimposed airspace consolidation, pleural effusion or pneumothorax. Status post right shoulder arthroplasty. IMPRESSION: 1. No displaced rib fractures identified. 2. No acute cardiopulmonary abnormalities. Electronically Signed   By: Kerby Moors M.D.   On: 08/29/2022 11:12   CT Head Wo Contrast  Result Date: 08/16/2022 CLINICAL DATA:  Involuntary movements with left leg weakness. EXAM: CT HEAD WITHOUT CONTRAST TECHNIQUE: Contiguous axial images were obtained from the base of the skull through the vertex without intravenous contrast. RADIATION DOSE REDUCTION: This exam was performed according to the departmental  dose-optimization program which includes automated exposure control, adjustment of the mA and/or kV according to patient size and/or use of iterative reconstruction technique. COMPARISON:  November 21, 2021 FINDINGS: Brain: There is mild cerebral atrophy with widening of the extra-axial spaces and ventricular dilatation. There are areas of decreased attenuation within the white matter tracts of the supratentorial brain, consistent with microvascular disease changes. Vascular: No hyperdense vessel or unexpected calcification. Skull: Normal. Negative for fracture or focal lesion. Sinuses/Orbits: An 11 mm x 8 mm right maxillary sinus polyp versus mucous retention cyst is seen. Other: None. IMPRESSION: 1. No acute intracranial abnormality. 2. Generalized cerebral atrophy and microvascular disease changes of the supratentorial brain. Electronically Signed   By: Virgina Norfolk M.D.   On: 08/16/2022 19:46     TODAY-DAY OF DISCHARGE:  Subjective:   Amber Stephenson today has no headache,no chest abdominal pain,no new weakness tingling or numbness, feels much better wants to go home today.   Objective:   Blood pressure 122/76, pulse 84, temperature 98.4 F (36.9 C), temperature source Oral, resp. rate 15, weight 83.5 kg, SpO2 93 %. No intake or output data in the 24 hours ending 09/04/22 1029 Filed Weights   09/01/22 0926  Weight: 83.5 kg    Exam: Awake Alert, Oriented *3, No new F.N deficits, Normal affect [place].AT,PERRAL Supple Neck,No JVD, No cervical lymphadenopathy appriciated.  Symmetrical Chest wall movement, Good air movement bilaterally, CTAB RRR,No Gallops,Rubs or new Murmurs, No Parasternal Heave +ve B.Sounds, Abd Soft, Non tender, No organomegaly appriciated, No rebound -guarding or rigidity. No  Cyanosis, Clubbing or edema, No new Rash or bruise   PERTINENT RADIOLOGIC STUDIES: ECHOCARDIOGRAM LIMITED  Result Date: 09/02/2022    ECHOCARDIOGRAM LIMITED REPORT   Patient Name:   Amber Stephenson  Ciccone Date of Exam: 09/02/2022 Medical Rec #:  [id number]         Height:       62.0 in Accession #:    [id number]        Weight:       184.0 lb Date of Birth:  09/05/1945          BSA:          1.845 m Patient Age:    81 years          BP:           129/77 mmHg Patient Gender: F                 HR:           93 bpm. Exam Location:  Inpatient Procedure: Limited Echo, Cardiac Doppler and Color Doppler Indications:    R55 Syncope  History:        Patient has prior history of Echocardiogram examinations, most                 recent 06/09/2022. CHF, CAD, Abnormal ECG, Arrythmias:Atrial                 Fibrillation; Risk Factors:Dyslipidemia, Diabetes and                 Hypertension.  Sonographer:    Roseanna Rainbow RDCS Referring Phys: [id number] [facility]  Sonographer Comments: Technically difficult study due to poor echo windows and patient is obese. Image acquisition challenging due to patient body habitus. IMPRESSIONS  1. Left ventricular ejection fraction, by estimation, is 65 to 70%. The left ventricle has normal function. Left ventricular diastolic parameters are indeterminate.  2. The right ventricular size is mildly enlarged.  3. Left atrial size was severely dilated.  4. The mitral valve is degenerative. Moderate mitral annular calcification.  5. The aortic valve is tricuspid. Aortic valve regurgitation is trivial.  6. The inferior vena cava is dilated in size with <50% respiratory variability, suggesting right atrial pressure of 15 mmHg. Comparison(s): Prior images reviewed side by side. Limited study, IVC is dilated from prior. FINDINGS  Left Ventricle: Left ventricular ejection fraction, by estimation, is 65 to 70%. The left ventricle has normal function. Left ventricular diastolic parameters are indeterminate. Right Ventricle: The right ventricular size is mildly enlarged. Left Atrium: Left atrial size was severely dilated. Pericardium: Trivial pericardial effusion is present. The pericardial effusion is  circumferential. Mitral Valve: The mitral valve is degenerative in appearance. Moderate mitral annular calcification. MV peak gradient, 8.0 mmHg. The mean mitral valve gradient is 3.0 mmHg. Tricuspid Valve: The tricuspid valve is not well visualized. Tricuspid valve regurgitation is not demonstrated. Aortic Valve: The aortic valve is tricuspid. Aortic valve regurgitation is trivial. Aorta: The aortic root and ascending aorta are structurally normal, with no evidence of dilitation. Venous: The inferior vena cava is dilated in size with less than 50% respiratory variability, suggesting right atrial pressure of 15 mmHg. Additional Comments: Spectral Doppler performed. Color Doppler performed.  LEFT VENTRICLE PLAX 2D LVIDd:         3.20 cm LVIDs:         2.10 cm LV PW:         1.70 cm LV IVS:  1.30 cm  IVC IVC diam: 2.80 cm  AORTA Ao Asc diam: 3.80 cm MITRAL VALVE MV Peak grad: 8.0 mmHg MV Mean grad: 3.0 mmHg MV Vmax:      1.41 m/s MV Vmean:     77.0 cm/s Rudean Haskell MD Electronically signed by Rudean Haskell MD Signature Date/Time: 09/02/2022/4:45:38 PM    Final      PERTINENT LAB RESULTS: CBC: Recent Labs    09/02/22 0958 09/03/22 0935  WBC 4.7 3.8*  HGB 11.8* 12.3  HCT 35.0* 37.6  PLT 122* 107*   CMET CMP     Component Value Date/Time   NA 137 09/03/2022 0935   NA 137 07/02/2022 1155   K 4.0 09/03/2022 0935   CL 101 09/03/2022 0935   CO2 25 09/03/2022 0935   GLUCOSE 196 (H) 09/03/2022 0935   BUN 6 (L) 09/03/2022 0935   BUN 13 07/02/2022 1155   CREATININE 0.83 09/03/2022 0935   CALCIUM 8.3 (L) 09/03/2022 0935   PROT 5.5 (L) 09/02/2022 0958   PROT 6.9 07/02/2022 1155   ALBUMIN 2.5 (L) 09/02/2022 0958   ALBUMIN 4.1 07/02/2022 1155   AST 27 09/02/2022 0958   ALT 13 09/02/2022 0958   ALKPHOS 42 09/02/2022 0958   BILITOT 0.8 09/02/2022 0958   BILITOT 0.4 07/02/2022 1155   GFRNONAA >60 09/03/2022 0935   GFRAA 71 06/13/2020 1134    GFR Estimated Creatinine  Clearance: 58.7 mL/min (by C-G formula based on SCr of 0.83 mg/dL). No results for input(s): "LIPASE", "AMYLASE" in the last 72 hours. Recent Labs    09/01/22 1857  CKTOTAL 387*   Invalid input(s): "POCBNP" No results for input(s): "DDIMER" in the last 72 hours. No results for input(s): "HGBA1C" in the last 72 hours. No results for input(s): "CHOL", "HDL", "LDLCALC", "TRIG", "CHOLHDL", "LDLDIRECT" in the last 72 hours. Recent Labs    09/01/22 1857  TSH 0.785   Recent Labs    09/01/22 1857  VITAMINB12 211   Coags: No results for input(s): "INR" in the last 72 hours.  Invalid input(s): "PT"  Microbiology: Recent Results (from the past 240 hour(s))  Blood Culture (routine x 2)     Status: None (Preliminary result)   Collection Time: 09/01/22 10:23 AM   Specimen: BLOOD RIGHT ARM  Result Value Ref Range Status   Specimen Description   Final    BLOOD RIGHT ARM Performed at [facility], [address] [place], [place] [address]    Special Requests   Final    BOTTLES DRAWN AEROBIC AND ANAEROBIC Blood Culture adequate volume Performed at [facility], [address], [place], [place] [address]    Culture   Final    NO GROWTH 3 DAYS Performed at [facility] [address], [place], [place] [address]    Report Status PENDING  Incomplete    FURTHER DISCHARGE INSTRUCTIONS:  Get Medicines reviewed and adjusted: Please take all your medications with you for your next visit with your Primary MD  Laboratory/radiological data: Please request your Primary MD to go over all hospital tests and procedure/radiological results at the follow up, please ask your Primary MD to get all Hospital records sent to his/her office.  In some cases, they will be blood work, cultures and biopsy results pending at the time of your discharge. Please request that your primary care M.D. goes through all the records of your hospital data and follows up  on these results.  Also Note the following:  If you experience worsening of your admission symptoms, develop shortness of breath, life threatening emergency, suicidal or homicidal thoughts you must seek medical attention immediately by calling 911 or calling your MD immediately  if symptoms less severe.  You must read complete instructions/literature along with all the possible adverse reactions/side effects for all the Medicines you take and that have been prescribed to you. Take any new Medicines after you have completely understood and accpet all the possible adverse reactions/side effects.   Do not drive when taking Pain medications or sleeping medications (Benzodaizepines)  Do not take more than prescribed Pain, Sleep and Anxiety Medications. It is not advisable to combine anxiety,sleep and pain medications without talking with your primary care practitioner  Special Instructions: If you have smoked or chewed Tobacco  in the last 2 yrs please stop smoking, stop any regular Alcohol  and or any Recreational drug use.  Wear Seat belts while driving.  Please note: You were cared for by a hospitalist during your hospital stay. Once you are discharged, your primary care physician will handle any further medical issues. Please note that NO REFILLS for any discharge medications will be authorized once you are discharged, as it is imperative that you return to your primary care physician (or establish a relationship with a primary care physician if you do not have one) for your post hospital discharge needs so that they can reassess your need for medications and monitor your lab values.  Total Time spent coordinating discharge including counseling, education and face to face time equals greater than 30 minutes.  SignedOren Binet 09/04/2022 10:29 AM

## 2022-09-04 NOTE — Plan of Care (Signed)
Patient is discharging home with PT/OT and with her spouse

## 2022-09-04 NOTE — Inpatient Diabetes Management (Signed)
Inpatient Diabetes Program Recommendations  AACE/ADA: New Consensus Statement on Inpatient Glycemic Control (2015)  Target Ranges:  Prepandial:   less than 140 mg/dL      Peak postprandial:   less than 180 mg/dL (1-2 hours)      Critically ill patients:  140 - 180 mg/dL   Lab Results  Component Value Date   GLUCAP 212 (H) 09/04/2022   HGBA1C 6.8 (A) 05/28/2022    Review of Glycemic Control  Latest Reference Range & Units 09/03/22 11:52 09/03/22 15:44 09/03/22 21:27 09/04/22 07:54  Glucose-Capillary 70 - 99 mg/dL 274 (H) 224 (H) 222 (H) 212 (H)  (H): Data is abnormally high Diabetes history: Type 2 DM Outpatient Diabetes medications: Lantus 34 units BID, Humalog 4-8 units TID, Metformin 500 mg BID, Mounjaro 5 mg qwk Current orders for Inpatient glycemic control: Semglee 15 units BID, Novolog 0-9 units TID  Inpatient Diabetes Program Recommendations:    Consider increasing Semglee 20 units BID.   Thanks, Bronson Curb, MSN, RNC-OB Diabetes Coordinator [phone] (8a-5p)

## 2022-09-04 NOTE — TOC Transition Note (Signed)
Transition of Care [facility]) - CM/SW Discharge Note   Patient Details  Name: Amber Stephenson MRN: [id number] Date of Birth: 05-03-1946  Transition of [facility] CM/SW Contact:  Levonne Lapping, RN Phone Number: 09/04/2022, 9:53 AM   Clinical Narrative:     Patient to dc to home today with Spouse.  Home Health PT and OT have been recommended and [name] will provide services. No DME has been recommended and Patient's Husband states she has a walker at home.  AVS is updated  No additional TOC needs           Patient Goals and CMS Choice      Discharge Placement                         Discharge Plan and Services Additional resources added to the After Visit Summary for                                       Social Determinants of Health (SDOH) Interventions SDOH Screenings   Food Insecurity: No Food Insecurity (07/14/2022)  Housing: Low Risk  (07/14/2022)  Transportation Needs: No Transportation Needs (07/14/2022)  Utilities: Not At Risk (06/28/2022)  Alcohol Screen: Low Risk  (12/13/2021)  Depression (PHQ2-9): Low Risk  (08/18/2022)  Financial Resource Strain: Low Risk  (06/30/2022)  Physical Activity: Inactive (12/13/2021)  Social Connections: Socially Integrated (12/13/2021)  Stress: No Stress Concern Present (12/13/2021)  Tobacco Use: Low Risk  (09/01/2022)     Readmission Risk Interventions    06/29/2022   10:48 AM  Readmission Risk Prevention Plan  Transportation Screening Complete  HRI or [name] Complete  Social Work Consult for [place] Planning/Counseling Complete  Palliative Care Screening Not Applicable  Medication Review Press photographer) Complete

## 2022-09-05 ENCOUNTER — Telehealth: Payer: Self-pay

## 2022-09-05 NOTE — Transitions of Care (Post Inpatient/ED Visit) (Signed)
   09/05/2022  Name: Amber Stephenson MRN: [id number] DOB: 17-Jan-1946  Today's TOC FU Call Status: Today's TOC FU Call Status:: Successful TOC FU Call Competed TOC FU Call Complete Date: 09/04/22  Transition Care Management Follow-up Telephone Call Date of Discharge: 09/04/22 Discharge Facility: [facility]) Type of Discharge: Inpatient Admission Primary Inpatient Discharge Diagnosis:: Hypotension/Acute Toxic Metabolic Encephalopathy How have you been since you were released from the hospital?: Better Any questions or concerns?: No  Items Reviewed: Did you receive and understand the discharge instructions provided?: Yes Medications obtained and verified?: Yes (Medications Reviewed) Any new allergies since your discharge?: No Dietary orders reviewed?: Yes Type of Diet Ordered:: Carb modified/Heart Healthy Do you have support at home?: Yes People in Home: spouse Name of Support/Comfort Primary Source: [facility] and Equipment/Supplies: Were Home Health Services Ordered?: Yes Name of Home Health Agency:: [facility] Has Agency set up a time to come to your home?: No EMR reviewed for Home Health Orders: Orders present/patient has not received call (refer to CM for follow-up) Any new equipment or medical supplies ordered?: No  Functional Questionnaire: Do you need assistance with bathing/showering or dressing?: Yes (supervision) Do you need assistance with meal preparation?: Yes Do you need assistance with eating?: No Do you have difficulty maintaining continence: No Do you need assistance with getting out of bed/getting out of a chair/moving?: No Do you have difficulty managing or taking your medications?: Yes (spouse manages medications per patient)  Follow up appointments reviewed: PCP Follow-up appointment confirmed?: Yes Date of PCP follow-up appointment?: 09/16/22 Follow-up Provider: Gilford Silvius, NP Specialist Hospital Follow-up appointment confirmed?: Yes Date of  Specialist follow-up appointment?: 09/09/22 Follow-Up Specialty Provider:: Armanda Magic, Cardiology Do you need transportation to your follow-up appointment?: No Do you understand care options if your condition(s) worsen?: Yes-patient verbalized understanding  SDOH Interventions Today    Flowsheet Row Most Recent Value  SDOH Interventions   Food Insecurity Interventions Intervention Not Indicated  Housing Interventions Intervention Not Indicated      Jodelle Gross, RN, BSN, CCM Care Management Coordinator [facility] Phone: [phone]/Fax: [phone]

## 2022-09-06 LAB — CULTURE, BLOOD (ROUTINE X 2)
Culture: NO GROWTH
Special Requests: ADEQUATE

## 2022-09-08 DIAGNOSIS — M2540 Effusion, unspecified joint: Secondary | ICD-10-CM | POA: Diagnosis not present

## 2022-09-08 DIAGNOSIS — E669 Obesity, unspecified: Secondary | ICD-10-CM | POA: Diagnosis not present

## 2022-09-08 DIAGNOSIS — G894 Chronic pain syndrome: Secondary | ICD-10-CM | POA: Diagnosis not present

## 2022-09-08 DIAGNOSIS — M79671 Pain in right foot: Secondary | ICD-10-CM | POA: Diagnosis not present

## 2022-09-08 DIAGNOSIS — R768 Other specified abnormal immunological findings in serum: Secondary | ICD-10-CM | POA: Diagnosis not present

## 2022-09-08 DIAGNOSIS — R0600 Dyspnea, unspecified: Secondary | ICD-10-CM | POA: Diagnosis not present

## 2022-09-08 DIAGNOSIS — Z6834 Body mass index (BMI) 34.0-34.9, adult: Secondary | ICD-10-CM | POA: Diagnosis not present

## 2022-09-08 DIAGNOSIS — M79672 Pain in left foot: Secondary | ICD-10-CM | POA: Diagnosis not present

## 2022-09-08 DIAGNOSIS — E790 Hyperuricemia without signs of inflammatory arthritis and tophaceous disease: Secondary | ICD-10-CM | POA: Diagnosis not present

## 2022-09-08 DIAGNOSIS — M2560 Stiffness of unspecified joint, not elsewhere classified: Secondary | ICD-10-CM | POA: Diagnosis not present

## 2022-09-09 ENCOUNTER — Inpatient Hospital Stay: Payer: Medicare Other | Admitting: Family Medicine

## 2022-09-09 ENCOUNTER — Encounter: Payer: Self-pay | Admitting: Cardiology

## 2022-09-09 ENCOUNTER — Ambulatory Visit: Payer: Medicare Other | Attending: Cardiology | Admitting: Cardiology

## 2022-09-09 VITALS — BP 110/70 | HR 64 | Ht 62.0 in | Wt 190.0 lb

## 2022-09-09 DIAGNOSIS — R0781 Pleurodynia: Secondary | ICD-10-CM | POA: Insufficient documentation

## 2022-09-09 DIAGNOSIS — I4821 Permanent atrial fibrillation: Secondary | ICD-10-CM | POA: Diagnosis not present

## 2022-09-09 DIAGNOSIS — E7800 Pure hypercholesterolemia, unspecified: Secondary | ICD-10-CM | POA: Insufficient documentation

## 2022-09-09 DIAGNOSIS — I10 Essential (primary) hypertension: Secondary | ICD-10-CM | POA: Insufficient documentation

## 2022-09-09 DIAGNOSIS — I5032 Chronic diastolic (congestive) heart failure: Secondary | ICD-10-CM | POA: Insufficient documentation

## 2022-09-09 DIAGNOSIS — I2510 Atherosclerotic heart disease of native coronary artery without angina pectoris: Secondary | ICD-10-CM | POA: Diagnosis not present

## 2022-09-09 MED ORDER — METOPROLOL TARTRATE 50 MG PO TABS
50.0000 mg | ORAL_TABLET | Freq: Two times a day (BID) | ORAL | 3 refills | Status: DC
Start: 2022-09-09 — End: 2023-01-06

## 2022-09-09 MED ORDER — APIXABAN 5 MG PO TABS
5.0000 mg | ORAL_TABLET | Freq: Two times a day (BID) | ORAL | 3 refills | Status: DC
Start: 2022-09-09 — End: 2023-11-02

## 2022-09-09 NOTE — Patient Instructions (Addendum)
Medication Instructions:  Your physician recommends that you continue on your current medications as directed. Please refer to the Current Medication list given to you today.  *If you need a refill on your cardiac medications before your next appointment, please call your pharmacy*   Lab Work: Please complete a FASTING lipid panel and an ALT in our office today before your leave.  If you have labs (blood work) drawn today and your tests are completely normal, you will receive your results only by: MyChart Message (if you have MyChart) OR A paper copy in the mail If you have any lab test that is abnormal or we need to change your treatment, we will call you to review the results.   Testing/Procedures: None.   Follow-Up:    Your next appointment:   1 year(s)  Provider:   Armanda Magic, MD

## 2022-09-09 NOTE — Progress Notes (Signed)
Cardiology Office Note    Date:  09/09/2022   ID:  Amber Stephenson, DOB 1945/11/18, MRN [id number]  PCP:  Mechele Claude, MD  Cardiologist: Armanda Magic, MD  Chief Complaint  Patient presents with   Coronary Artery Disease   Hypertension   Hyperlipidemia   Atrial Fibrillation   Congestive Heart Failure    History of Present Illness:    Amber Stephenson is a 77 y.o. female with past medical history of permanent atrial fibrillation (on Eliquis), HFpEF, HTN, HLD, Type 2 DM, Stage 2-3 CKD, asthma, OSA (intolerant to CPAP), Bipolar Disorder and CAD (nonobstructive CAD by cath in 01/2018).  Echo 7/22 showed EF of 65 to 70% with no regional wall motion abnormalities  She was hospitalized a week ago after a fall.  She had unsteady gait and frequent falls and was found to be hypotensive with concern for sepsis physiology.She developed right sided pleuritic CP that was felt to be musculoskeletal.  2D echo showed normal LVF EF 67-70% and mitral annular calcifications .  hsTrop minimally elevated at 38>30>17>15.  She has continued to have pleurtiitic CP on the right that is much worse with deep breathing.  Chest CT did not show any rib fx. She has had some SOB that she thinks is because she cannot take a deep breath in. Her frequent falls were felt to be related to polypharmacy/antihypertensive/diuretic use/narcotics/Benzos/psychotropic drugs.  Today her main complaint is that of ongoing right sided pleuritic CP that is completely reproducible with palpation over her right chest wall and worse with movement and deep breathing.  This also makes her feel SOB.  This was noted in the hospital as well and felt to be MSK.   Past Medical History:  Diagnosis Date   Acquired hammer toe 12/06/2020   Allergic rhinitis 02/24/2019   Ambulatory dysfunction 04/23/2020   Bipolar 1 disorder 01/23/2015   with GAD, benzo dependence   BPPV (benign paroxysmal positional vertigo) 05/20/2019   CAD (coronary artery  disease)    Nonobstructive; Managed by Dr. Purvis Sheffield   Chronic constipation 04/25/2020   Chronic diastolic heart failure 05/30/2015   Chronic pain syndrome 08/22/2019   back pain, sacroiliitis   Chronic post-traumatic stress disorder (PTSD) 12/06/2020   Diabetic neuropathy 02/06/2016   Dyslipidemia 09/24/2020   Essential hypertension    Fibromyalgia 03/19/2017   Functional diarrhea 10/26/2020   Head trauma 09/17/2020   Hemorrhoids 03/11/2022   Herpes genitalis in women 07/16/2015   Insomnia 01/23/2015   Migraine headache with aura 02/12/2016   Mild intermittent asthma 01/12/2018   Myofascial pain dysfunction syndrome 08/22/2019   Non-alcoholic fatty liver disease 01/12/2018   Opioid dependence 12/06/2020   OSA (obstructive sleep apnea) 02/24/2019   10/09/2018 - HST  - AHI 40.6    Osteopenia 12/06/2020   Rx alendronate 35 mg.   Pulmonary hypertension    RLS (restless legs syndrome) 04/27/2015   Tremor, essential 12/11/2021   Type II diabetes mellitus 09/24/2020    Past Surgical History:  Procedure Laterality Date   BREAST REDUCTION SURGERY     EYE SURGERY Right    cateracts   HAMMER TOE SURGERY     LEFT HEART CATH AND CORONARY ANGIOGRAPHY N/A 02/03/2018   Procedure: LEFT HEART CATH AND CORONARY ANGIOGRAPHY;  Surgeon: [name], Peter M, MD;  Location[facility] INVASIVE CV LAB;  Service: Cardiovascular;  Laterality: N/A;   REVERSE SHOULDER ARTHROPLASTY Right 08/19/2019   Procedure: REVERSE SHOULDER ARTHROPLASTY;  Surgeon: Beverely Low, MD;  Location: [facility];  Service: Orthopedics;  Laterality: Right;  interscalene block   SHOULDER SURGERY Right    "I BROKE MY SHOUDLER   THIGH SURGERY     "TO REMOVE A TUMOR "    Current Medications: Outpatient Medications Prior to Visit  Medication Sig Dispense Refill   Acetaminophen (TYLENOL PO) Take 2 tablets by mouth as needed (pain/headache).     albuterol (PROVENTIL) (2.5 MG/3ML) 0.083% nebulizer solution Take 3 mLs (2.5 mg total) by  nebulization every 6 (six) hours as needed for wheezing or shortness of breath. 150 mL 0   albuterol (VENTOLIN HFA) 108 (90 Base) MCG/ACT inhaler Inhale 2 puffs into the lungs every 6 (six) hours as needed for wheezing or shortness of breath. 1 each 0   apixaban (ELIQUIS) 5 MG TABS tablet TAKE 1 TABLET TWICE A DAY 180 tablet 1   ARIPiprazole (ABILIFY) 2 MG tablet Take 2 mg by mouth at bedtime.     atorvastatin (LIPITOR) 40 MG tablet TAKE 1 TABLET(40 MG) BY MOUTH DAILY (NEEDS TO BE SEEN BEFORE NEXT REFILL) 30 tablet 0   benztropine (COGENTIN) 1 MG tablet TAKE 1 TABLET(1 MG) BY MOUTH TWICE DAILY 180 tablet 0   colchicine 0.6 MG tablet Take 1 tablet (0.6 mg total) by mouth 2 (two) times daily as needed. 30 tablet 1   Continuous Blood Gluc Sensor (DEXCOM G7 SENSOR) MISC Change sensor every 10 days 9 each 3   Continuous Blood Gluc Transmit (DEXCOM G6 TRANSMITTER) MISC Use as instructed to check blood sugar. Change every 90 days 1 each 1   cyanocobalamin (VITAMIN B12) 1000 MCG tablet Take 1 tablet (1,000 mcg total) by mouth daily. 30 tablet 1   denosumab (PROLIA) 60 MG/ML SOSY injection Inject 60 mg into the skin every 6 (six) months. 1 mL 1   escitalopram (LEXAPRO) 10 MG tablet Take 10 mg by mouth daily.     Fluticasone-Salmeterol (ADVAIR DISKUS) 500-50 MCG/DOSE AEPB Inhale 1 puff into the lungs in the morning and at bedtime. (Patient taking differently: Inhale 1 puff into the lungs as needed (SOB).) 60 each 0   furosemide (LASIX) 40 MG tablet Take 1 tablet (40 mg total) by mouth daily as needed. If dyspnea, weight gain of 3 lbs or greater, or edema. 30 tablet 0   Galcanezumab-gnlm (EMGALITY) 120 MG/ML SOAJ INJECT CONTENTS OF 1 PEN INTO THE SKIN ONCE MONTHLY 1 mL 0   glucose blood (FREESTYLE LITE) test strip Check blood sugar 2-3 weekly 100 each 5   icosapent Ethyl (VASCEPA) 1 g capsule TAKE 2 CAPSULES TWICE A DAY WITH MEALS 360 capsule 0   insulin glargine (LANTUS SOLOSTAR) 100 UNIT/ML Solostar Pen  Inject 20 Units into the skin 2 (two) times daily. 75 mL 3   insulin lispro (HUMALOG KWIKPEN) 100 UNIT/ML KwikPen Max daily 50 units (Patient taking differently: Inject 4-8 Units into the skin 3 (three) times daily.) 45 mL 6   Insulin Pen Needle 29G X MISC 1 Device by Does not apply route daily in the afternoon. 400 each 3   lidocaine (LIDODERM) 5 % Place 1 patch onto the skin every 12 (twelve) hours. Remove & Discard patch within 12 hours or as directed by MD 10 patch 0   lisinopril (ZESTRIL) 10 MG tablet Take 1 tablet (10 mg total) by mouth daily. 90 tablet 3   LORazepam (ATIVAN) 1 MG tablet Take 0.5 tablets (0.5 mg total) by mouth 2 (two) times daily. 90 tablet 0   metFORMIN (GLUCOPHAGE) 500  MG tablet Take 1 tablet (500 mg total) by mouth 2 (two) times daily with a meal. TAKE 1 TABLET(500 MG) BY MOUTH TWICE DAILY AFTER A MEAL (Patient taking differently: Take 500 mg by mouth 2 (two) times daily with a meal.) 180 tablet 3   metoprolol tartrate (LOPRESSOR) 50 MG tablet Take 1 tablet (50 mg total) by mouth 2 (two) times daily. 60 tablet 1   morphine (MS CONTIN) 15 MG 12 hr tablet Take 1 tablet (15 mg total) by mouth every 12 (twelve) hours. This is a long acting form, so take 1 tab 2x/day to keep pain more even- it is NOT a short acting pain med like you were taking- for chronic pain 60 tablet 0   naloxone (NARCAN) nasal spray 4 mg/0.1 mL Place 1 spray into the nose once.     nystatin (MYCOSTATIN/NYSTOP) powder Apply 1 Application topically as needed (rash). 15 g 2   nystatin ointment (MYCOSTATIN) Apply 1 Application topically 2 (two) times daily. (Patient taking differently: Apply 1 Application topically as needed (rash).) 30 g 3   ondansetron (ZOFRAN-ODT) 8 MG disintegrating tablet Take 1 tablet (8 mg total) by mouth every 6 (six) hours as needed for nausea or vomiting. 20 tablet 1   OXcarbazepine (TRILEPTAL) 150 MG tablet Take 1 tablet (150 mg total) by mouth 2 (two) times daily. 60 tablet 0    pantoprazole (PROTONIX) 40 MG tablet Take 1 tablet (40 mg total) by mouth daily at 12 noon. 30 tablet 1   pregabalin (LYRICA) 100 MG capsule Take 1 capsule (100 mg total) by mouth 2 (two) times daily. 60 capsule 1   senna-docusate (SENOKOT-S) 8.6-50 MG tablet Take 1 tablet by mouth at bedtime. (Patient taking differently: Take 1 tablet by mouth as needed for moderate constipation.) 30 tablet 0   tirzepatide (MOUNJARO) 5 MG/0.5ML Pen Inject 5 mg into the skin once a week. 2 mL 2   Vitamin D, Cholecalciferol, 25 MCG (1000 UT) TABS Take 1,000 Units by mouth daily in the afternoon.     Facility-Administered Medications Prior to Visit  Medication Dose Route Frequency Provider Last Rate Last Admin   bupivacaine-epinephrine (MARCAINE W/ EPI) 0.5% -1:200000 injection    Anesthesia Intra-op Shelton Silvas, MD   12 mL at 01/21/19 0935     Allergies:   Iodine, Ivp dye [iodinated contrast media], Latex, and Tizanidine   Social History   Socioeconomic History   Marital status: Married    Spouse name: Karren Burly   Number of children: 3   Years of education: 15   Highest education level: Associate degree: academic program  Occupational History   Occupation: Retired    Comment: Chief Financial Officer  Tobacco Use   Smoking status: Never   Smokeless tobacco: Never  Building services engineer Use: Never used  Substance and Sexual Activity   Alcohol use: Not Currently    Alcohol/week: 0.0 standard drinks of alcohol   Drug use: No   Sexual activity: Yes    Partners: Male    Birth control/protection: Post-menopausal  Other Topics Concern   Not on file  Social History Narrative   Lives at home with husband.    They have 3 children who live away - [place], [place], and [place].   She is from [place] and most of her family lives there.   Her husbands's family live nearby   Right handed.   Social Determinants of Health   Financial Resource Strain: Low Risk  (06/30/2022)   Overall  Financial Resource Strain  (CARDIA)    Difficulty of Paying Living Expenses: Not hard at all  Food Insecurity: No Food Insecurity (09/05/2022)   Hunger Vital Sign    Worried About Running Out of Food in the Last Year: Never true    Ran Out of Food in the Last Year: Never true  Transportation Needs: No Transportation Needs (07/14/2022)   PRAPARE - Administrator, Civil Service (Medical): No    Lack of Transportation (Non-Medical): No  Physical Activity: Inactive (12/13/2021)   Exercise Vital Sign    Days of Exercise per Week: 0 days    Minutes of Exercise per Session: 0 min  Stress: No Stress Concern Present (12/13/2021)   [employer] of Occupational Health - Occupational Stress Questionnaire    Feeling of Stress : Not at all  Social Connections: Socially Integrated (12/13/2021)   Social Connection and Isolation Panel [NHANES]    Frequency of Communication with Friends and Family: More than three times a week    Frequency of Social Gatherings with Friends and Family: Twice a week    Attends Religious Services: More than 4 times per year    Active Member of [employer] or Organizations: Yes    Attends Engineer, structural: More than 4 times per year    Marital Status: Married     Family History:  The patient's family history includes Alcohol abuse in her sister; Alzheimer's disease in her father; Diabetes in her brother, mother, and sister; Heart disease in her brother, mother, and sister; Mental illness in her brother; Stroke in her brother.   Review of Systems:    Please see the history of present illness.     All other systems reviewed and are otherwise negative except as noted above.   Physical Exam:    VS:  BP 110/70   Pulse 64   Ht 5\' 2"  (1.575 m)   Wt 190 lb (86.2 kg)   SpO2 98%   BMI 34.75 kg/m    GEN: Well nourished, well developed in no acute distress HEENT: Normal NECK: No JVD; No carotid bruits LYMPHATICS: No lymphadenopathy CARDIAC:irregularly irregular, no murmurs,  rubs, gallops RESPIRATORY:  Clear to auscultation without rales, wheezing or rhonchi  ABDOMEN: Soft, non-tender, non-distended MUSCULOSKELETAL:  No edema; No deformity  SKIN: Warm and dry NEUROLOGIC:  Alert and oriented x 3 PSYCHIATRIC:  Normal affect  Wt Readings from Last 3 Encounters:  09/09/22 190 lb (86.2 kg)  09/01/22 184 lb (83.5 kg)  08/29/22 191 lb (86.6 kg)     Studies/Labs Reviewed:   EKG:  EKG is not ordered today   Recent Labs: 08/16/2022: Magnesium 2.3 09/01/2022: B Natriuretic Peptide 127.5; TSH 0.785 09/02/2022: ALT 13 09/03/2022: BUN 6; Creatinine, Ser 0.83; Hemoglobin 12.3; Platelets 107; Potassium 4.0; Sodium 137   Lipid Panel    Component Value Date/Time   CHOL 161 04/03/2021 1442   TRIG 311 (H) 04/03/2021 1442   HDL 41 04/03/2021 1442   CHOLHDL 3.9 04/03/2021 1442   LDLCALC 70 04/03/2021 1442   LDLDIRECT 62 03/17/2016 1614    Additional studies/ records that were reviewed today include:   Cardiac Catheterization: 01/2018 Prox LAD lesion is 40% stenosed. Mid LAD lesion is 30% stenosed. Ost 1st Mrg to 1st Mrg lesion is 30% stenosed. LV end diastolic pressure is moderately elevated.   1. Nonobstructive CAD with left dominant circulation 2. Moderately elevated LVEDP (post hydration)   Plan: medical therapy. Will resume Eliquis this pm. Continue  diuretic therapy.   No indication for antiplatelet therapy at this time.  Echocardiogram: 11/2020 IMPRESSIONS     1. Left ventricular ejection fraction, by estimation, is 65 to 70%. The  left ventricle has normal function. The left ventricle has no regional  wall motion abnormalities. There is moderate left ventricular hypertrophy.  Left ventricular diastolic  parameters are indeterminate.   2. Right ventricular systolic function is low normal. The right  ventricular size is mildly enlarged. There is normal pulmonary artery  systolic pressure.   3. Left atrial size was severely dilated.   4. Right atrial  size was severely dilated.   5. The mitral valve is abnormal. Trivial mitral valve regurgitation. No  evidence of mitral stenosis. Moderate mitral annular calcification.   6. The aortic valve was not well visualized. Aortic valve regurgitation  is not visualized. No aortic stenosis is present.   7. The inferior vena cava is dilated in size with >50% respiratory  variability, suggesting right atrial pressure of 8 mmHg.   Assessment:    1. Permanent atrial fibrillation   2. Chronic diastolic heart failure   3. Coronary artery disease involving native coronary artery of native heart without angina pectoris   4. Essential hypertension   5. Pure hypercholesterolemia   6. Pleuritic chest pain      Plan:   In order of problems listed above:  1. Permanent Atrial Fibrillation -She remains in atrial for relation with good heart rate control and denies any palpitations -Continue prescription drug with apixaban 5 mg twice daily and Lopressor 50 mg twice daily with as needed refills -She denies any bleeding problems on DOAC -I have personally reviewed and interpreted outside labs performed by patient's PCP which showed hemoglobin 12.3 on 09/03/2022  2. HFpEF -2D echo 09/02/2022 showed a preserved EF of 65-70% with mild RV enlargement but normal RV function which is likely due to known OSA and asthma. She has been intolerant to CPAP due to claustrophobia.  -She appears euvolemic on exam today -Continue prescription drug management with lisinopril 10 mg daily, Lopressor 50 mg twice daily with as needed refills -I have personally reviewed and interpreted outside labs performed by patient's PCP which showed serum creatinine 0.83 and potassium 4 on 09/03/2022  3. CAD - Prior cath in 01/2018 showed nonobstructive CAD as outlined above.  - She has not had any anginal type symptoms since I saw her last - Continue prescription drug management with Lopressor 50 mg twice daily and atorvastatin 40 mg daily  with as needed refills - No aspirin due to DOAC    4. HTN -BP is adequately controlled on exam today -Continue prescription drug management with Lopressor 50 mg twice daily with as needed refills  5. HLD - Followed by her PCP.  - Continue prescription drug with atorvastatin 40 mg daily with as needed refills     6.  Pleuritic chest wall pain -this is completely reproducible by palpation over right chest wall and c/w MSK etiology -likely related to recent falls but no rib f noted on imaging on last hospitalization -she is seeing her PCP in the next week                             Followup with me or extender  in 1 year   Medication Adjustments/Labs and Tests Ordered: Current medicines are reviewed at length with the patient today.  Concerns regarding medicines are outlined above.  Medication changes, Labs and Tests ordered today are listed in the Patient Instructions below.   Signed, Armanda Magic, MD  09/09/2022 1:42 PM    [facility] [address] [place], [place] [address] Phone: [phone] Fax: [phone]

## 2022-09-09 NOTE — Addendum Note (Signed)
Addended by: Luellen Pucker on: 09/09/2022 01:55 PM   Modules accepted: Orders

## 2022-09-09 NOTE — Addendum Note (Signed)
Addended by: Luellen Pucker on: 09/09/2022 01:51 PM   Modules accepted: Orders

## 2022-09-10 ENCOUNTER — Telehealth: Payer: Self-pay

## 2022-09-10 ENCOUNTER — Telehealth: Payer: Self-pay | Admitting: *Deleted

## 2022-09-10 LAB — LIPID PANEL
Chol/HDL Ratio: 3.6 ratio (ref 0.0–4.4)
Cholesterol, Total: 123 mg/dL (ref 100–199)
HDL: 34 mg/dL — ABNORMAL LOW (ref >39)
LDL Chol Calc (NIH): 66 mg/dL (ref 0–99)
Triglycerides: 127 mg/dL (ref 0–149)
VLDL Cholesterol Cal: 23 mg/dL (ref 5–40)

## 2022-09-10 LAB — ALT: ALT: 8 IU/L (ref 0–32)

## 2022-09-10 NOTE — Telephone Encounter (Signed)
-----   Message from Quintella Reichert, MD sent at 09/10/2022 10:06 AM EDT ----- Lipids at goal continue current therapy and forward to PCP

## 2022-09-10 NOTE — Telephone Encounter (Signed)
Brad's response: Patient Amber Stephenson is not a patient of ours. We had orders to do a setup back in 07-14-22 but we were unable to process due to missing info.  Per notes it looks like patients' husband was made aware that she would need a new sleep study to have the order for new pap processed.  See note from order :     Order NOT qualified. Pt needs new PSG. Her PSG is from 2020 and medical records indicate she has not treated OSA in years. Insurance is Medicare.     2/16/2024Spoke to husband and made aware pt needs to have a new sleep study done.  No DL in airview.  Left VM for Brad with [employer].

## 2022-09-10 NOTE — Telephone Encounter (Signed)
Called patient to discuss lipids at goal on most recent labs, Dr. Mayford Knife advising to continue current therapy. Patient verbalizes understanding, forwarded results to PCP.

## 2022-09-11 ENCOUNTER — Encounter: Payer: Self-pay | Admitting: Adult Health

## 2022-09-11 ENCOUNTER — Ambulatory Visit (INDEPENDENT_AMBULATORY_CARE_PROVIDER_SITE_OTHER): Payer: Medicare Other | Admitting: Adult Health

## 2022-09-11 VITALS — BP 114/76 | HR 96 | Temp 97.6°F | Ht 62.0 in | Wt 187.2 lb

## 2022-09-11 DIAGNOSIS — R9389 Abnormal findings on diagnostic imaging of other specified body structures: Secondary | ICD-10-CM | POA: Diagnosis not present

## 2022-09-11 DIAGNOSIS — G4733 Obstructive sleep apnea (adult) (pediatric): Secondary | ICD-10-CM

## 2022-09-11 DIAGNOSIS — J9611 Chronic respiratory failure with hypoxia: Secondary | ICD-10-CM | POA: Diagnosis not present

## 2022-09-11 DIAGNOSIS — J4520 Mild intermittent asthma, uncomplicated: Secondary | ICD-10-CM

## 2022-09-11 MED ORDER — FLUTICASONE-SALMETEROL 100-50 MCG/ACT IN AEPB: 1.0000 | INHALATION_SPRAY | Freq: Two times a day (BID) | RESPIRATORY_TRACT | 5 refills | Status: DC

## 2022-09-11 NOTE — Progress Notes (Signed)
@Patient  ID: Amber Stephenson, female    DOB: 1946-04-13, 77 y.o.   MRN: [id number]  Chief Complaint  Patient presents with   Follow-up    Referring provider: Mechele Claude, MD  HPI: 77 year old female never smoker seen for pulmonary consult July 10, 2022 to re-establish for sleep apnea and asthma , chronic respiratory failure on O2  Met history significant for sleep apnea diastolic congestive heart failure, atrial fibrillation on chronic anticoagulation therapy, pulmonary hypertension, PTSD, bipolar disorder, diabetes  TEST/EVENTS :  Home sleep study Oct 09, 2018 showed severe sleep apnea with AHI at 40/hour, and SpO2 low at 82%.   May 2019 chest x-ray showed some basilar scarring versus atelectasis January 2024 portable chest x-ray personally reviewed showing cardiomegaly, low lung volumes, interstitial prominence versus atelectasis bases, likely left pleural effusion January 2023 CT chest images independently reviewed showing normal pulmonary parenchyma, some atelectasis in the lingula, multiple calcified left upper lobe granulomas   PFT: August 2019 ratio normal, FVC 1.09 L 41% predicted, total lung capacity 3.59 L 78% predicted, DLCO 14.1 mL 69% predicted   Overnight oxygen testing: January 31, 2018 on room air: O2 saturation less than 88% for 25 minutes, O2 saturation nadir 82%   Labs: August 2019 sed rate 24, aldolase normal 4.9, ANA negative, anti-Jo 1-, centromere negative, CCP negative, rheumatoid factor negative, SSA negative, SSB negative, SCL 70 neg   Path:   Echo: September 2016 echocardiogram LVEF 55 to 60%, left atrium severely dilated, trivial mitral regurgitation, PA systolic pressure 25 mmHg September 2019 echocardiogram normal LVEF but RVSP was 46 mmHg January 2024 TTE: LVEF 65 to 70%, mild LVH, RV systolic function is normal, size is normal, left atrium severely dilated valves okay   Heart Catheterization: February 03, 2018 left heart catheterization:  Proximal LAD 40%, mid LAD 30%, first obtuse marginal 30% stenosed, LVEDP moderately elevated (27 mmHg) to     Sleep study: 2020 AHI 40.6, greater than 170 minutes below 88%   Ambulatory oximetry: February 2024 walked 200 feet in clinic on room air and O2 saturation dropped to 84%  09/11/2022 Follow up : Asthma, OSA, O2 RF  Patient returns for a 1 month follow-up.  Patient has underlying intermittent asthma and is on albuterol and Advair as needed.  Says that she uses her Advair about once a week.  Patient was set up for pulmonary function testing that was completed on August 27, 2022.  This showed mild to moderate restriction with FEV1 at 72%, ratio 83, FVC 65%, diffusing capacity was decreased at 63%.  Total lung capacity decreased at 67%.  This is slightly decreased compared to 2019.  Previous high-resolution CT chest in 2019 showed mild to moderate air trapping, small right upper lobe pulmonary nodule measuring 3 mm.  Tiny calcified scattered granulomas.  Parenchymal band in the lingular consistent with a postinflammatory scarring.  And some minimal patchy subpleural reticulation and groundglass attenuation.  Favored to be a postinflammatory scarring.  Autoimmune workup was negative.  Patient says that she feels her breathing has gotten somewhat better.  She is not as short of breath.  Is not using her oxygen during the daytime.  Walk test today in office shows no significant desaturations with ambulation on room air.  She says she does not have to use her Advair or albuterol very much.   Recent hospitalization for unsteady gait, falls and suspected oversedation secondary to polypharmacy.  During workup patient underwent CT chest, that showed interstitial thickening in the  right upper lobe and lung bases with peripheral reticular opacities in the right upper lobe with numerous calcified granulomas and additional small calcified granulomas bilaterally appears stable  Patient has underlying severe sleep  apnea.  Has had trouble tolerating CPAP in the past.  We discussed restarting CPAP.  She has ongoing snoring daytime sleepiness and restless sleep.  She also is interested in the inspire device. Will need repeat sleep study .   Is on O2 At bedtime  .   Allergies  Allergen Reactions   Iodine Anaphylaxis   Ivp Dye [Iodinated Contrast Media] Anaphylaxis and Swelling    Throat closes    Latex Other (See Comments)    Latex tape pulls skin with it   Tizanidine Other (See Comments)    Weakness, goofy, bad dreams    Immunization History  Administered Date(s) Administered   Fluad Quad(high Dose 65+) 03/04/2019, 04/03/2021, 03/26/2022   Influenza, High Dose Seasonal PF 03/30/2015, 03/17/2016, 03/19/2017, 02/17/2018   Influenza,inj,Quad PF,6+ Mos 03/30/2015, 03/17/2016, 02/29/2020   Moderna Sars-Covid-2 Vaccination 07/18/2019, 08/15/2019, 05/02/2020   Pneumococcal Conjugate-13 11/12/2017   Pneumococcal Polysaccharide-23 05/30/2015   Tdap 07/17/2011   Zoster Recombinat (Shingrix) 01/30/2021, 04/09/2021   Zoster, Live 07/17/2011, 06/02/2012    Past Medical History:  Diagnosis Date   Acquired hammer toe 12/06/2020   Allergic rhinitis 02/24/2019   Ambulatory dysfunction 04/23/2020   Bipolar 1 disorder 01/23/2015   with GAD, benzo dependence   BPPV (benign paroxysmal positional vertigo) 05/20/2019   CAD (coronary artery disease)    Nonobstructive; Managed by Dr. Purvis Sheffield   Chronic constipation 04/25/2020   Chronic diastolic heart failure 05/30/2015   Chronic pain syndrome 08/22/2019   back pain, sacroiliitis   Chronic post-traumatic stress disorder (PTSD) 12/06/2020   Diabetic neuropathy 02/06/2016   Dyslipidemia 09/24/2020   Essential hypertension    Fibromyalgia 03/19/2017   Functional diarrhea 10/26/2020   Head trauma 09/17/2020   Hemorrhoids 03/11/2022   Herpes genitalis in women 07/16/2015   Insomnia 01/23/2015   Migraine headache with aura 02/12/2016   Mild intermittent  asthma 01/12/2018   Myofascial pain dysfunction syndrome 08/22/2019   Non-alcoholic fatty liver disease 01/12/2018   Opioid dependence 12/06/2020   OSA (obstructive sleep apnea) 02/24/2019   10/09/2018 - HST  - AHI 40.6    Osteopenia 12/06/2020   Rx alendronate 35 mg.   Pulmonary hypertension    RLS (restless legs syndrome) 04/27/2015   Tremor, essential 12/11/2021   Type II diabetes mellitus 09/24/2020    Tobacco History: Social History   Tobacco Use  Smoking Status Never  Smokeless Tobacco Never   Counseling given: Not Answered   Outpatient Medications Prior to Visit  Medication Sig Dispense Refill   Acetaminophen (TYLENOL PO) Take 2 tablets by mouth as needed (pain/headache).     albuterol (PROVENTIL) (2.5 MG/3ML) 0.083% nebulizer solution Take 3 mLs (2.5 mg total) by nebulization every 6 (six) hours as needed for wheezing or shortness of breath. 150 mL 0   albuterol (VENTOLIN HFA) 108 (90 Base) MCG/ACT inhaler Inhale 2 puffs into the lungs every 6 (six) hours as needed for wheezing or shortness of breath. 1 each 0   apixaban (ELIQUIS) 5 MG TABS tablet Take 1 tablet (5 mg total) by mouth 2 (two) times daily. 180 tablet 3   ARIPiprazole (ABILIFY) 2 MG tablet Take 2 mg by mouth at bedtime.     atorvastatin (LIPITOR) 40 MG tablet TAKE 1 TABLET(40 MG) BY MOUTH DAILY (NEEDS TO BE SEEN BEFORE NEXT  REFILL) 30 tablet 0   benztropine (COGENTIN) 1 MG tablet TAKE 1 TABLET(1 MG) BY MOUTH TWICE DAILY 180 tablet 0   colchicine 0.6 MG tablet Take 1 tablet (0.6 mg total) by mouth 2 (two) times daily as needed. 30 tablet 1   Continuous Blood Gluc Sensor (DEXCOM G7 SENSOR) MISC Change sensor every 10 days 9 each 3   Continuous Blood Gluc Transmit (DEXCOM G6 TRANSMITTER) MISC Use as instructed to check blood sugar. Change every 90 days 1 each 1   cyanocobalamin (VITAMIN B12) 1000 MCG tablet Take 1 tablet (1,000 mcg total) by mouth daily. 30 tablet 1   denosumab (PROLIA) 60 MG/ML SOSY injection  Inject 60 mg into the skin every 6 (six) months. 1 mL 1   escitalopram (LEXAPRO) 10 MG tablet Take 10 mg by mouth daily.     furosemide (LASIX) 40 MG tablet Take 1 tablet (40 mg total) by mouth daily as needed. If dyspnea, weight gain of 3 lbs or greater, or edema. 30 tablet 0   Galcanezumab-gnlm (EMGALITY) 120 MG/ML SOAJ INJECT CONTENTS OF 1 PEN INTO THE SKIN ONCE MONTHLY 1 mL 0   glucose blood (FREESTYLE LITE) test strip Check blood sugar 2-3 weekly 100 each 5   icosapent Ethyl (VASCEPA) 1 g capsule TAKE 2 CAPSULES TWICE A DAY WITH MEALS 360 capsule 0   insulin glargine (LANTUS SOLOSTAR) 100 UNIT/ML Solostar Pen Inject 20 Units into the skin 2 (two) times daily. 75 mL 3   insulin lispro (HUMALOG KWIKPEN) 100 UNIT/ML KwikPen Max daily 50 units (Patient taking differently: Inject 4-8 Units into the skin 3 (three) times daily.) 45 mL 6   Insulin Pen Needle 29G X MISC 1 Device by Does not apply route daily in the afternoon. 400 each 3   lidocaine (LIDODERM) 5 % Place 1 patch onto the skin every 12 (twelve) hours. Remove & Discard patch within 12 hours or as directed by MD 10 patch 0   lisinopril (ZESTRIL) 10 MG tablet Take 1 tablet (10 mg total) by mouth daily. 90 tablet 3   LORazepam (ATIVAN) 1 MG tablet Take 0.5 tablets (0.5 mg total) by mouth 2 (two) times daily. 90 tablet 0   metFORMIN (GLUCOPHAGE) 500 MG tablet Take 1 tablet (500 mg total) by mouth 2 (two) times daily with a meal. TAKE 1 TABLET(500 MG) BY MOUTH TWICE DAILY AFTER A MEAL (Patient taking differently: Take 500 mg by mouth 2 (two) times daily with a meal.) 180 tablet 3   metoprolol tartrate (LOPRESSOR) 50 MG tablet Take 1 tablet (50 mg total) by mouth 2 (two) times daily. 60 tablet 3   morphine (MS CONTIN) 15 MG 12 hr tablet Take 1 tablet (15 mg total) by mouth every 12 (twelve) hours. This is a long acting form, so take 1 tab 2x/day to keep pain more even- it is NOT a short acting pain med like you were taking- for chronic pain 60  tablet 0   naloxone (NARCAN) nasal spray 4 mg/0.1 mL Place 1 spray into the nose once.     nystatin (MYCOSTATIN/NYSTOP) powder Apply 1 Application topically as needed (rash). 15 g 2   nystatin ointment (MYCOSTATIN) Apply 1 Application topically 2 (two) times daily. (Patient taking differently: Apply 1 Application topically as needed (rash).) 30 g 3   ondansetron (ZOFRAN-ODT) 8 MG disintegrating tablet Take 1 tablet (8 mg total) by mouth every 6 (six) hours as needed for nausea or vomiting. 20 tablet 1   OXcarbazepine (  TRILEPTAL) 150 MG tablet Take 1 tablet (150 mg total) by mouth 2 (two) times daily. 60 tablet 0   pantoprazole (PROTONIX) 40 MG tablet Take 1 tablet (40 mg total) by mouth daily at 12 noon. 30 tablet 1   pregabalin (LYRICA) 100 MG capsule Take 1 capsule (100 mg total) by mouth 2 (two) times daily. 60 capsule 1   senna-docusate (SENOKOT-S) 8.6-50 MG tablet Take 1 tablet by mouth at bedtime. (Patient taking differently: Take 1 tablet by mouth as needed for moderate constipation.) 30 tablet 0   tirzepatide (MOUNJARO) 5 MG/0.5ML Pen Inject 5 mg into the skin once a week. 2 mL 2   Vitamin D, Cholecalciferol, 25 MCG (1000 UT) TABS Take 1,000 Units by mouth daily in the afternoon.     Fluticasone-Salmeterol (ADVAIR DISKUS) 500-50 MCG/DOSE AEPB Inhale 1 puff into the lungs in the morning and at bedtime. (Patient taking differently: Inhale 1 puff into the lungs as needed (SOB).) 60 each 0   Facility-Administered Medications Prior to Visit  Medication Dose Route Frequency Provider Last Rate Last Admin   bupivacaine-epinephrine (MARCAINE W/ EPI) 0.5% -1:200000 injection    Anesthesia Intra-op Shelton Silvas, MD   12 mL at 01/21/19 0935     Review of Systems:   Constitutional:   No  weight loss, night sweats,  Fevers, chills, fatigue, or  lassitude.  HEENT:   No headaches,  Difficulty swallowing,  Tooth/dental problems, or  Sore throat,                No sneezing, itching, ear ache, nasal  congestion, post nasal drip,   CV:  No chest pain,  Orthopnea, PND, swelling in lower extremities, anasarca, dizziness, palpitations, syncope.   GI  No heartburn, indigestion, abdominal pain, nausea, vomiting, diarrhea, change in bowel habits, loss of appetite, bloody stools.   Resp: No shortness of breath with exertion or at rest.  No excess mucus, no productive cough,  No non-productive cough,  No coughing up of blood.  No change in color of mucus.  No wheezing.  No chest wall deformity  Skin: no rash or lesions.  GU: no dysuria, change in color of urine, no urgency or frequency.  No flank pain, no hematuria   MS:  No joint pain or swelling.  No decreased range of motion.  No back pain.    Physical Exam  BP 114/76 (BP Location: Left Arm, Patient Position: Sitting, Cuff Size: Normal)   Pulse 96   Temp 97.6 F (36.4 C) (Oral)   Ht 5\' 2"  (1.575 m)   Wt 187 lb 3.2 oz (84.9 kg)   SpO2 95%   BMI 34.24 kg/m   GEN: A/Ox3; pleasant , NAD, well nourished    HEENT:  Power/AT,  EACs-clear, TMs-wnl, NOSE-clear, THROAT-clear, no lesions, no postnasal drip or exudate noted.   NECK:  Supple w/ fair ROM; no JVD; normal carotid impulses w/o bruits; no thyromegaly or nodules palpated; no lymphadenopathy.    RESP  Clear  P & A; w/o, wheezes/ rales/ or rhonchi. no accessory muscle use, no dullness to percussion  CARD:  RRR, no m/r/g, no peripheral edema, pulses intact, no cyanosis or clubbing.  GI:   Soft & nt; nml bowel sounds; no organomegaly or masses detected.   Musco: Warm bil, no deformities or joint swelling noted.   Neuro: alert, no focal deficits noted.    Skin: Warm, no lesions or rashes    Lab Results:  CBC  Component Value Date/Time   WBC 3.8 (L) 09/03/2022 0935   RBC 4.16 09/03/2022 0935   HGB 12.3 09/03/2022 0935   HGB 13.8 07/02/2022 1155   HCT 37.6 09/03/2022 0935   HCT 41.8 07/02/2022 1155   PLT 107 (L) 09/03/2022 0935   PLT 185 07/02/2022 1155   MCV 90.4  09/03/2022 0935   MCV 91 07/02/2022 1155   MCH 29.6 09/03/2022 0935   MCHC 32.7 09/03/2022 0935   RDW 13.6 09/03/2022 0935   RDW 12.5 07/02/2022 1155   LYMPHSABS 2.0 09/01/2022 1023   LYMPHSABS 3.7 (H) 07/02/2022 1155   MONOABS 1.0 09/01/2022 1023   EOSABS 0.0 09/01/2022 1023   EOSABS 0.4 07/02/2022 1155   BASOSABS 0.1 09/01/2022 1023   BASOSABS 0.1 07/02/2022 1155    BMET    Component Value Date/Time   NA 137 09/03/2022 0935   NA 137 07/02/2022 1155   K 4.0 09/03/2022 0935   CL 101 09/03/2022 0935   CO2 25 09/03/2022 0935   GLUCOSE 196 (H) 09/03/2022 0935   BUN 6 (L) 09/03/2022 0935   BUN 13 07/02/2022 1155   CREATININE 0.83 09/03/2022 0935   CALCIUM 8.3 (L) 09/03/2022 0935   GFRNONAA >60 09/03/2022 0935   GFRAA 71 06/13/2020 1134    BNP    Component Value Date/Time   BNP 127.5 (H) 09/01/2022 1857    ProBNP    Component Value Date/Time   PROBNP 965 (H) 12/27/2020 1555    Imaging: ECHOCARDIOGRAM LIMITED  Result Date: 09/02/2022    ECHOCARDIOGRAM LIMITED REPORT   Patient Name:   Judy PimpleERESA D Tello Date of Exam: 09/02/2022 Medical Rec #:  [id number]         Height:       62.0 in Accession #:    [id number]        Weight:       184.0 lb Date of Birth:  11/21/1945          BSA:          1.845 m Patient Age:    77 years          BP:           129/77 mmHg Patient Gender: F                 HR:           93 bpm. Exam Location:  Inpatient Procedure: Limited Echo, Cardiac Doppler and Color Doppler Indications:    R55 Syncope  History:        Patient has prior history of Echocardiogram examinations, most                 recent 06/09/2022. CHF, CAD, Abnormal ECG, Arrythmias:Atrial                 Fibrillation; Risk Factors:Dyslipidemia, Diabetes and                 Hypertension.  Sonographer:    Sheralyn Boatmanina West RDCS Referring Phys: [id number] [facility]  Sonographer Comments: Technically difficult study due to poor echo windows and patient is obese. Image acquisition challenging due to patient body  habitus. IMPRESSIONS  1. Left ventricular ejection fraction, by estimation, is 65 to 70%. The left ventricle has normal function. Left ventricular diastolic parameters are indeterminate.  2. The right ventricular size is mildly enlarged.  3. Left atrial size was severely dilated.  4. The mitral valve is degenerative. Moderate mitral annular calcification.  5.  The aortic valve is tricuspid. Aortic valve regurgitation is trivial.  6. The inferior vena cava is dilated in size with <50% respiratory variability, suggesting right atrial pressure of 15 mmHg. Comparison(s): Prior images reviewed side by side. Limited study, IVC is dilated from prior. FINDINGS  Left Ventricle: Left ventricular ejection fraction, by estimation, is 65 to 70%. The left ventricle has normal function. Left ventricular diastolic parameters are indeterminate. Right Ventricle: The right ventricular size is mildly enlarged. Left Atrium: Left atrial size was severely dilated. Pericardium: Trivial pericardial effusion is present. The pericardial effusion is circumferential. Mitral Valve: The mitral valve is degenerative in appearance. Moderate mitral annular calcification. MV peak gradient, 8.0 mmHg. The mean mitral valve gradient is 3.0 mmHg. Tricuspid Valve: The tricuspid valve is not well visualized. Tricuspid valve regurgitation is not demonstrated. Aortic Valve: The aortic valve is tricuspid. Aortic valve regurgitation is trivial. Aorta: The aortic root and ascending aorta are structurally normal, with no evidence of dilitation. Venous: The inferior vena cava is dilated in size with less than 50% respiratory variability, suggesting right atrial pressure of 15 mmHg. Additional Comments: Spectral Doppler performed. Color Doppler performed.  LEFT VENTRICLE PLAX 2D LVIDd:         3.20 cm LVIDs:         2.10 cm LV PW:         1.70 cm LV IVS:        1.30 cm  IVC IVC diam: 2.80 cm  AORTA Ao Asc diam: 3.80 cm MITRAL VALVE MV Peak grad: 8.0 mmHg MV Mean  grad: 3.0 mmHg MV Vmax:      1.41 m/s MV Vmean:     77.0 cm/s Riley Lam MD Electronically signed by Riley Lam MD Signature Date/Time: 09/02/2022/4:45:38 PM    Final    EEG adult  Result Date: 09/02/2022 Charlsie Quest, MD     09/02/2022  9:04 AM Patient Name: JACILYN SANPEDRO MRN: [id number] Epilepsy Attending: Charlsie Quest Referring Physician/Provider: Lanae Boast, MD Date: 09/01/2022 Duration: 21.48 mins Patient history: 77yo F with ams. EEG to evaluate for seizure. Level of alertness: Awake AEDs during EEG study: None Technical aspects: This EEG study was done with scalp electrodes positioned according to the 10-20 International system of electrode placement. Electrical activity was reviewed with band pass filter of 1-70Hz , sensitivity of 7 uV/mm, display speed of 39mm/sec with a 60Hz  notched filter applied as appropriate. EEG data were recorded continuously and digitally stored.  Video monitoring was available and reviewed as appropriate. Description: The posterior dominant rhythm consists of 6 Hz activity of moderate voltage (25-35 uV) seen predominantly in posterior head regions, symmetric and reactive to eye opening and eye closing. EEG showed continuous generalized 3 to 6 Hz theta-delta slowing. Hyperventilation and photic stimulation were not performed.   ABNORMALITY - Continuous slow, generalized IMPRESSION: This study is suggestive of moderate diffuse encephalopathy, nonspecific etiology. No seizures or epileptiform discharges were seen throughout the recording. Priyanka Annabelle Harman   US Abdomen Complete  Result Date: 09/01/2022 CLINICAL DATA:  Unspecified abdominal pain EXAM: ABDOMEN ULTRASOUND COMPLETE COMPARISON:  None Available. FINDINGS: Gallbladder: No gallstones or wall thickening visualized. No sonographic Murphy sign noted by sonographer. Common bile duct: Diameter: 2-3 mm in proximal diameter. Liver: Hepatic parenchymal echogenicity is diffusely increased and there is  coarsening of the hepatic echotexture in keeping with mild hepatic steatosis. No focal intrahepatic masses are seen and there is no intrahepatic biliary ductal dilation. Portal vein is patent on color  Doppler imaging with normal direction of blood flow towards the liver. IVC: No abnormality visualized. Pancreas: Obscured by overlying bowel gas Spleen: Not visualized due to limited patient mobility Right Kidney: Length: 11.7 cm. Echogenicity within normal limits. No mass or hydronephrosis visualized. Left Kidney: Not visualized due to limited patient mobility Abdominal aorta: Obscured by overlying bowel gas Other findings: None. IMPRESSION: 1. Limited examination with nonvisualization of the spleen and left kidney. No acute abnormality identified. 2. Mild hepatic steatosis. Electronically Signed   By: Helyn Numbers M.D.   On: 09/01/2022 19:52   CT Chest Wo Contrast  Result Date: 09/01/2022 CLINICAL DATA:  Patient found lying beside the bed yesterday afternoon. Abrasion to the right face and year. Multiple sites of pain including the right arm and chest. EXAM: CT CHEST WITHOUT CONTRAST TECHNIQUE: Multidetector CT imaging of the chest was performed following the standard protocol without IV contrast. RADIATION DOSE REDUCTION: This exam was performed according to the departmental dose-optimization program which includes automated exposure control, adjustment of the mA and/or kV according to patient size and/or use of iterative reconstruction technique. COMPARISON:  06/28/2021. FINDINGS: Cardiovascular: Heart normal in size. No pericardial effusion. Mitral valve annular calcifications. Mild left coronary artery and circumflex coronary artery calcifications. Great vessels are normal in caliber. Mild aortic atherosclerosis. Mediastinum/Nodes: No neck base, mediastinal or hilar masses. No enlarged lymph nodes. Trachea and esophagus are unremarkable. Lungs/Pleura: No lung consolidation. No evidence of edema. Interstitial  thickening most evident in the peripheral right upper lobe and lung bases. There are reticulonodular opacities peripherally in the right upper lobe with numerous calcified granuloma. Additional small calcified granuloma are seen in all remaining lobes. Findings are similar to the prior CT. No pleural effusion or pneumothorax. Upper Abdomen: Decreased attenuation of the liver consistent with fatty infiltration. Stable 9 mm left adrenal nodule consistent with an adenoma. No follow-up recommended. Musculoskeletal: No acute fracture or acute finding. No bone lesion. Partly imaged right shoulder reverse prosthesis appears well seated. IMPRESSION: 1. No acute findings.  No evidence of pneumonia. 2. Chronic lung findings as detailed stable from the prior CT. 3. Mild aortic atherosclerosis.  Coronary artery calcifications. 4. Hepatic steatosis. Aortic Atherosclerosis (ICD10-I70.0). Electronically Signed   By: Amie Portland M.D.   On: 09/01/2022 11:35   CT Head Wo Contrast  Result Date: 09/01/2022 CLINICAL DATA:  Blunt facial trauma EXAM: CT HEAD WITHOUT CONTRAST CT MAXILLOFACIAL WITHOUT CONTRAST CT CERVICAL SPINE WITHOUT CONTRAST TECHNIQUE: Multidetector CT imaging of the head, cervical spine, and maxillofacial structures were performed using the standard protocol without intravenous contrast. Multiplanar CT image reconstructions of the cervical spine and maxillofacial structures were also generated. RADIATION DOSE REDUCTION: This exam was performed according to the departmental dose-optimization program which includes automated exposure control, adjustment of the mA and/or kV according to patient size and/or use of iterative reconstruction technique. COMPARISON:  Head CT 08/16/2022 FINDINGS: CT HEAD FINDINGS Brain: No evidence of acute infarction, hemorrhage, hydrocephalus, extra-axial collection or mass lesion/mass effect. Vascular: No hyperdense vessel or unexpected calcification. Skull: Normal. Negative for fracture  or focal lesion. CT MAXILLOFACIAL FINDINGS Osseous: No fracture or mandibular dislocation. No destructive process. Orbits: No evidence of injury Sinuses: Negative for hemosinus Soft tissues: No hematoma or foreign body seen. CT CERVICAL SPINE FINDINGS Alignment: No traumatic malalignment Skull base and vertebrae: No acute fracture. No primary bone lesion or focal pathologic process. Soft tissues and spinal canal: No prevertebral fluid or swelling. No visible canal hematoma. Disc levels: Ordinary cervical spine degeneration  with endplate and facet spurring. Upper chest: Negative IMPRESSION: No evidence of intracranial or cervical spine injury. Negative for facial fracture. Electronically Signed   By: Tiburcio Pea M.D.   On: 09/01/2022 11:30   CT Cervical Spine Wo Contrast  Result Date: 09/01/2022 CLINICAL DATA:  Blunt facial trauma EXAM: CT HEAD WITHOUT CONTRAST CT MAXILLOFACIAL WITHOUT CONTRAST CT CERVICAL SPINE WITHOUT CONTRAST TECHNIQUE: Multidetector CT imaging of the head, cervical spine, and maxillofacial structures were performed using the standard protocol without intravenous contrast. Multiplanar CT image reconstructions of the cervical spine and maxillofacial structures were also generated. RADIATION DOSE REDUCTION: This exam was performed according to the departmental dose-optimization program which includes automated exposure control, adjustment of the mA and/or kV according to patient size and/or use of iterative reconstruction technique. COMPARISON:  Head CT 08/16/2022 FINDINGS: CT HEAD FINDINGS Brain: No evidence of acute infarction, hemorrhage, hydrocephalus, extra-axial collection or mass lesion/mass effect. Vascular: No hyperdense vessel or unexpected calcification. Skull: Normal. Negative for fracture or focal lesion. CT MAXILLOFACIAL FINDINGS Osseous: No fracture or mandibular dislocation. No destructive process. Orbits: No evidence of injury Sinuses: Negative for hemosinus Soft tissues: No  hematoma or foreign body seen. CT CERVICAL SPINE FINDINGS Alignment: No traumatic malalignment Skull base and vertebrae: No acute fracture. No primary bone lesion or focal pathologic process. Soft tissues and spinal canal: No prevertebral fluid or swelling. No visible canal hematoma. Disc levels: Ordinary cervical spine degeneration with endplate and facet spurring. Upper chest: Negative IMPRESSION: No evidence of intracranial or cervical spine injury. Negative for facial fracture. Electronically Signed   By: Tiburcio Pea M.D.   On: 09/01/2022 11:30   CT Maxillofacial Wo Contrast  Result Date: 09/01/2022 CLINICAL DATA:  Blunt facial trauma EXAM: CT HEAD WITHOUT CONTRAST CT MAXILLOFACIAL WITHOUT CONTRAST CT CERVICAL SPINE WITHOUT CONTRAST TECHNIQUE: Multidetector CT imaging of the head, cervical spine, and maxillofacial structures were performed using the standard protocol without intravenous contrast. Multiplanar CT image reconstructions of the cervical spine and maxillofacial structures were also generated. RADIATION DOSE REDUCTION: This exam was performed according to the departmental dose-optimization program which includes automated exposure control, adjustment of the mA and/or kV according to patient size and/or use of iterative reconstruction technique. COMPARISON:  Head CT 08/16/2022 FINDINGS: CT HEAD FINDINGS Brain: No evidence of acute infarction, hemorrhage, hydrocephalus, extra-axial collection or mass lesion/mass effect. Vascular: No hyperdense vessel or unexpected calcification. Skull: Normal. Negative for fracture or focal lesion. CT MAXILLOFACIAL FINDINGS Osseous: No fracture or mandibular dislocation. No destructive process. Orbits: No evidence of injury Sinuses: Negative for hemosinus Soft tissues: No hematoma or foreign body seen. CT CERVICAL SPINE FINDINGS Alignment: No traumatic malalignment Skull base and vertebrae: No acute fracture. No primary bone lesion or focal pathologic process.  Soft tissues and spinal canal: No prevertebral fluid or swelling. No visible canal hematoma. Disc levels: Ordinary cervical spine degeneration with endplate and facet spurring. Upper chest: Negative IMPRESSION: No evidence of intracranial or cervical spine injury. Negative for facial fracture. Electronically Signed   By: Tiburcio Pea M.D.   On: 09/01/2022 11:30   DG Chest Port 1 View  Result Date: 09/01/2022 CLINICAL DATA:  Fall with hypotension EXAM: PORTABLE CHEST 1 VIEW COMPARISON:  08/29/2022 FINDINGS: Cardiomegaly. Bulky mitral annular calcification. Blunting at the lateral left costophrenic sulcus is from mediastinal fat. No edema, effusion, or convincing infiltrate. IMPRESSION: No acute finding when compared to prior. Electronically Signed   By: Tiburcio Pea M.D.   On: 09/01/2022 10:58   DG Thoracic  Spine 2 View  Result Date: 08/29/2022 CLINICAL DATA:  pain, fall EXAM: THORACIC SPINE 2 VIEWS COMPARISON:  CT 11/11/2021 FINDINGS: There are chronic compression deformities of T1, T9, and T11. Unchanged superior endplate Schmorl's node of T12. There is no evidence of new thoracic spine fracture radiographically. Slight dextroconvex lower thoracic curvature. Mild-to-moderate degenerative changes of the thoracic spine. IMPRESSION: Unchanged chronic compression deformities of T1, T9 and T11. No evidence of new thoracic spine fracture. Mild to moderate degenerative changes of the thoracic spine. Electronically Signed   By: Caprice Renshaw M.D.   On: 08/29/2022 11:18   DG Lumbar Spine Complete  Result Date: 08/29/2022 CLINICAL DATA:  fall, pain EXAM: LUMBAR SPINE - COMPLETE 4+ VIEW COMPARISON:  CT 11/11/2021 FINDINGS: Levoconvex curvature. There is a chronic compression fracture of L2, with up to 20% height loss, unchanged. There is no evidence of new lumbar spine fracture. Unchanged mild T11 compression deformity. There is mild to moderate multilevel degenerative disc disease, worst at L3-L4 and L4-L5.  There is trace degenerative retrolisthesis at L4-L5 and anterolisthesis at L5-S1. There is moderate-severe lower lumbar predominant facet arthropathy. IMPRESSION: Chronic compression deformity of L2 without evidence of progressive height loss. No evidence of new lumbar spine fracture. Mild to moderate multilevel degenerative disc disease, worst at L3-L4 and L4-L5. Moderate-severe lower lumbar predominant facet arthropathy. Electronically Signed   By: Caprice Renshaw M.D.   On: 08/29/2022 11:13   DG Ribs Unilateral W/Chest Right  Result Date: 08/29/2022 CLINICAL DATA:  Fall.  Rib pain. EXAM: RIGHT RIBS AND CHEST - 3+ VIEW COMPARISON:  Chest radiograph 06/29/2022 FINDINGS: No displaced rib fractures identified. Heart size and mediastinal contours are unremarkable. Mild chronic interstitial coarsening identified bilaterally. No superimposed airspace consolidation, pleural effusion or pneumothorax. Status post right shoulder arthroplasty. IMPRESSION: 1. No displaced rib fractures identified. 2. No acute cardiopulmonary abnormalities. Electronically Signed   By: Signa Kell M.D.   On: 08/29/2022 11:12   CT Head Wo Contrast  Result Date: 08/16/2022 CLINICAL DATA:  Involuntary movements with left leg weakness. EXAM: CT HEAD WITHOUT CONTRAST TECHNIQUE: Contiguous axial images were obtained from the base of the skull through the vertex without intravenous contrast. RADIATION DOSE REDUCTION: This exam was performed according to the departmental dose-optimization program which includes automated exposure control, adjustment of the mA and/or kV according to patient size and/or use of iterative reconstruction technique. COMPARISON:  November 21, 2021 FINDINGS: Brain: There is mild cerebral atrophy with widening of the extra-axial spaces and ventricular dilatation. There are areas of decreased attenuation within the white matter tracts of the supratentorial brain, consistent with microvascular disease changes. Vascular: No  hyperdense vessel or unexpected calcification. Skull: Normal. Negative for fracture or focal lesion. Sinuses/Orbits: An 11 mm x 8 mm right maxillary sinus polyp versus mucous retention cyst is seen. Other: None. IMPRESSION: 1. No acute intracranial abnormality. 2. Generalized cerebral atrophy and microvascular disease changes of the supratentorial brain. Electronically Signed   By: Aram Candela M.D.   On: 08/16/2022 19:46         Latest Ref Rng & Units 08/27/2022   10:43 AM 01/20/2018    1:58 PM  PFT Results  FVC-Pre L 1.68  1.09   FVC-Predicted Pre % 65  41   FVC-Post L 1.61    FVC-Predicted Post % 62    Pre FEV1/FVC % % 83  83   Post FEV1/FCV % % 85    FEV1-Pre L 1.40  0.90   FEV1-Predicted Pre %  72  45   FEV1-Post L 1.36    DLCO uncorrected ml/min/mmHg 11.34  14.09   DLCO UNC% % 63  69   DLCO corrected ml/min/mmHg 11.27  14.18   DLCO COR %Predicted % 63  70   DLVA Predicted % 106  125   TLC L 3.20  3.59   TLC % Predicted % 67  78   RV % Predicted % 55  84     No results found for: "NITRICOXIDE"      Assessment & Plan:   No problem-specific Assessment & Plan notes found for this encounter.     Rubye Oaks, NP 09/11/2022

## 2022-09-11 NOTE — Patient Instructions (Addendum)
Set up for home sleep study.  Do not drive sleep  Healthy sleep regimen   May discontinue daytime Oxygen.  Continue on Oxygen 2l/m At bedtime  .   Asthma action plan :  Use Advair Twice daily  if cough/Wheezing develops on regular basis.  Albuterol inhaler As needed    HRCT Chest in 6 months   Follow up with Dr. Wynona Neat or Shamya Macfadden in 2 months and As needed

## 2022-09-12 NOTE — Assessment & Plan Note (Signed)
Appears to be doing well.  Asthma action plan discussed.  PFTs today show some moderate restriction.  Continue with Advair as discussed-she is to use this when her asthma symptoms flare.  She has albuterol as her rescue inhaler.  Plan  Patient Instructions  Set up for home sleep study.  Do not drive sleep  Healthy sleep regimen   May discontinue daytime Oxygen.  Continue on Oxygen 2l/m At bedtime  .   Asthma action plan :  Use Advair Twice daily  if cough/Wheezing develops on regular basis.  Albuterol inhaler As needed    HRCT Chest in 6 months   Follow up with Dr. Wynona Neat or Sharel Behne in 2 months and As needed

## 2022-09-12 NOTE — Assessment & Plan Note (Signed)
History of severe sleep apnea she has ongoing symptoms of snoring, daytime sleepiness and restless sleep.  Previously had had trouble tolerating CPAP.  She will need repeat sleep study for further evaluation.  Will set patient up for a home sleep study.  Patient does not want to do an in lab study.  Plan  Patient Instructions  Set up for home sleep study.  Do not drive sleep  Healthy sleep regimen   May discontinue daytime Oxygen.  Continue on Oxygen 2l/m At bedtime  .   Asthma action plan :  Use Advair Twice daily  if cough/Wheezing develops on regular basis.  Albuterol inhaler As needed    HRCT Chest in 6 months   Follow up with Dr. Wynona Neat or Linnet Bottari in 2 months and As needed

## 2022-09-12 NOTE — Assessment & Plan Note (Signed)
No desaturations with ambulation today with room air.  May discontinue oxygen during daytime.  Continue on O2 at bedtime at 2 L to keep O2 saturations greater than 88 to 90%

## 2022-09-16 ENCOUNTER — Ambulatory Visit (INDEPENDENT_AMBULATORY_CARE_PROVIDER_SITE_OTHER): Payer: Medicare Other | Admitting: Family Medicine

## 2022-09-16 ENCOUNTER — Encounter: Payer: Self-pay | Admitting: Family Medicine

## 2022-09-16 VITALS — BP 137/74 | HR 102 | Temp 98.0°F | Ht 62.0 in | Wt 187.6 lb

## 2022-09-16 DIAGNOSIS — I959 Hypotension, unspecified: Secondary | ICD-10-CM | POA: Diagnosis not present

## 2022-09-16 DIAGNOSIS — I9589 Other hypotension: Secondary | ICD-10-CM | POA: Diagnosis not present

## 2022-09-16 DIAGNOSIS — G928 Other toxic encephalopathy: Secondary | ICD-10-CM | POA: Diagnosis not present

## 2022-09-16 DIAGNOSIS — Z09 Encounter for follow-up examination after completed treatment for conditions other than malignant neoplasm: Secondary | ICD-10-CM | POA: Diagnosis not present

## 2022-09-16 DIAGNOSIS — Z79899 Other long term (current) drug therapy: Secondary | ICD-10-CM | POA: Diagnosis not present

## 2022-09-16 DIAGNOSIS — D708 Other neutropenia: Secondary | ICD-10-CM | POA: Diagnosis not present

## 2022-09-16 LAB — CBC WITH DIFFERENTIAL/PLATELET
Basophils Absolute: 0.1 10*3/uL (ref 0.0–0.2)
Immature Grans (Abs): 0.1 10*3/uL (ref 0.0–0.1)
Lymphocytes Absolute: 2.7 10*3/uL (ref 0.7–3.1)
Monocytes Absolute: 0.6 10*3/uL (ref 0.1–0.9)
Monocytes: 7 %
Neutrophils Absolute: 5.8 10*3/uL (ref 1.4–7.0)
RDW: 14.1 % (ref 11.7–15.4)

## 2022-09-16 LAB — CMP14+EGFR

## 2022-09-16 NOTE — Progress Notes (Signed)
Subjective:  Patient ID: Amber Stephenson, female    DOB: 15-Nov-1945, 77 y.o.   MRN: [id number]  Patient Care Team: Mechele Claude, MD as PCP - General (Family Medicine) Quintella Reichert, MD as PCP - Cardiology (Cardiology) Erlene Senters, MD as Referring Physician (Orthopedic Surgery) Archer Asa, MD as Consulting Physician (Psychiatry) Van Clines, MD as Consulting Physician (Neurology) [facility], Konrad Dolores, MD as Consulting Physician (Endocrinology) Antony Contras, MD as Consulting Physician (Ophthalmology) Adam Phenix, DPM as Consulting Physician (Podiatry) Quintella Reichert, MD as Consulting Physician (Cardiology) Quintella Reichert, MD as Consulting Physician (Cardiology) Clinton Gallant, RN as [facility] Care Management   Chief Complaint:  Hospitalization Follow-up (09/01/2022 - 09/04/2022 (3 days)/[facility]- )   HPI: Amber Stephenson is a 77 y.o. female presenting on 09/16/2022 for Hospitalization Follow-up (09/01/2022 - 09/04/2022 (3 days)/[facility]- )  Pt is a 77 year old female with history of bipolar disorder with GAD, HTN, HLD BPPV, CAD nonobstructive, chronic diastolic CHF chronic pain syndrome/back pain/sacroiliitis, PTSD diabetes mellitus with neuropathy, nonalcoholic fatty liver disease OSA using nocturnal oxygen/pulmonary hypertension, restless leg syndrome, and tremor. She presents today for hospital discharge follow up. She was seen in the ED for confusion, frequent falls, and decreased appetite. In ED, BP was low and labs revealed glucose 234, creatinine 1.5, anion gap 16, lactic acidosis 2.2 that increased to more than 9 and subsequently improved to 2.3 after receiving total 2.5 L bolus. She received cefepime, vancomycin, Flagyl. She was admitted for hypotension, possible sepsis, lactic acidosis, encephalopathy, polypharmacy, and AKI. Medications were adjusted during hospital and will require further adjusting in  the outpatient setting. She has followed up with pulmonology and cardiology. She has an appointment scheduled with psychiatry.   Relevant past medical, surgical, family, and social history reviewed and updated as indicated.  Allergies and medications reviewed and updated. Data reviewed: Chart in Epic.   Past Medical History:  Diagnosis Date   Acquired hammer toe 12/06/2020   Allergic rhinitis 02/24/2019   Ambulatory dysfunction 04/23/2020   Bipolar 1 disorder 01/23/2015   with GAD, benzo dependence   BPPV (benign paroxysmal positional vertigo) 05/20/2019   CAD (coronary artery disease)    Nonobstructive; Managed by Dr. Purvis Sheffield   Chronic constipation 04/25/2020   Chronic diastolic heart failure 05/30/2015   Chronic pain syndrome 08/22/2019   back pain, sacroiliitis   Chronic post-traumatic stress disorder (PTSD) 12/06/2020   Diabetic neuropathy 02/06/2016   Dyslipidemia 09/24/2020   Essential hypertension    Fibromyalgia 03/19/2017   Functional diarrhea 10/26/2020   Head trauma 09/17/2020   Hemorrhoids 03/11/2022   Herpes genitalis in women 07/16/2015   Insomnia 01/23/2015   Migraine headache with aura 02/12/2016   Mild intermittent asthma 01/12/2018   Myofascial pain dysfunction syndrome 08/22/2019   Non-alcoholic fatty liver disease 01/12/2018   Opioid dependence 12/06/2020   OSA (obstructive sleep apnea) 02/24/2019   10/09/2018 - HST  - AHI 40.6    Osteopenia 12/06/2020   Rx alendronate 35 mg.   Pulmonary hypertension    RLS (restless legs syndrome) 04/27/2015   Tremor, essential 12/11/2021   Type II diabetes mellitus 09/24/2020    Past Surgical History:  Procedure Laterality Date   BREAST REDUCTION SURGERY     EYE SURGERY Right    cateracts   HAMMER TOE SURGERY     LEFT HEART CATH AND CORONARY ANGIOGRAPHY N/A 02/03/2018   Procedure: LEFT HEART CATH AND CORONARY ANGIOGRAPHY;  Surgeon: [name], Peter M, MD;  Location: [facility] INVASIVE CV LAB;  Service: Cardiovascular;   Laterality: N/A;   REVERSE SHOULDER ARTHROPLASTY Right 08/19/2019   Procedure: REVERSE SHOULDER ARTHROPLASTY;  Surgeon: Beverely Low, MD;  Location: [facility] ORS;  Service: Orthopedics;  Laterality: Right;  interscalene block   SHOULDER SURGERY Right    "I BROKE MY SHOUDLER   THIGH SURGERY     "TO REMOVE A TUMOR "    Social History   Socioeconomic History   Marital status: Married    Spouse name: Dwight   Number of children: 3   Years of education: 15   Highest education level: Associate degree: academic program  Occupational History   Occupation: Retired    Comment: Chief Financial Officer  Tobacco Use   Smoking status: Never   Smokeless tobacco: Never  Building services engineer Use: Never used  Substance and Sexual Activity   Alcohol use: Not Currently    Alcohol/week: 0.0 standard drinks of alcohol   Drug use: No   Sexual activity: Yes    Partners: Male    Birth control/protection: Post-menopausal  Other Topics Concern   Not on file  Social History Narrative   Lives at home with husband.    They have 3 children who live away - [place], [place], and [place].   She is from [place] and most of her family lives there.   Her husbands's family live nearby   Right handed.   Social Determinants of Health   Financial Resource Strain: Low Risk  (06/30/2022)   Overall Financial Resource Strain (CARDIA)    Difficulty of Paying Living Expenses: Not hard at all  Food Insecurity: No Food Insecurity (09/05/2022)   Hunger Vital Sign    Worried About Running Out of Food in the Last Year: Never true    Ran Out of Food in the Last Year: Never true  Transportation Needs: No Transportation Needs (07/14/2022)   PRAPARE - Administrator, Civil Service (Medical): No    Lack of Transportation (Non-Medical): No  Physical Activity: Inactive (12/13/2021)   Exercise Vital Sign    Days of Exercise per Week: 0 days    Minutes of Exercise per Session: 0 min  Stress: No Stress Concern Present (12/13/2021)    [employer] of Occupational Health - Occupational Stress Questionnaire    Feeling of Stress : Not at all  Social Connections: Socially Integrated (12/13/2021)   Social Connection and Isolation Panel [NHANES]    Frequency of Communication with Friends and Family: More than three times a week    Frequency of Social Gatherings with Friends and Family: Twice a week    Attends Religious Services: More than 4 times per year    Active Member of [employer] or Organizations: Yes    Attends Engineer, structural: More than 4 times per year    Marital Status: Married  Catering manager Violence: Not At Risk (06/28/2022)   Humiliation, Afraid, Rape, and Kick questionnaire    Fear of Current or Ex-Partner: No    Emotionally Abused: No    Physically Abused: No    Sexually Abused: No    Outpatient Encounter Medications as of 09/16/2022  Medication Sig   Acetaminophen (TYLENOL PO) Take 2 tablets by mouth as needed (pain/headache).   albuterol (PROVENTIL) (2.5 MG/3ML) 0.083% nebulizer solution Take 3 mLs (2.5 mg total) by nebulization every 6 (six) hours as needed for wheezing or shortness of breath.   albuterol (VENTOLIN HFA) 108 (90  Base) MCG/ACT inhaler Inhale 2 puffs into the lungs every 6 (six) hours as needed for wheezing or shortness of breath.   apixaban (ELIQUIS) 5 MG TABS tablet Take 1 tablet (5 mg total) by mouth 2 (two) times daily.   ARIPiprazole (ABILIFY) 2 MG tablet Take 2 mg by mouth at bedtime.   atorvastatin (LIPITOR) 40 MG tablet TAKE 1 TABLET(40 MG) BY MOUTH DAILY (NEEDS TO BE SEEN BEFORE NEXT REFILL)   benztropine (COGENTIN) 1 MG tablet TAKE 1 TABLET(1 MG) BY MOUTH TWICE DAILY   colchicine 0.6 MG tablet Take 1 tablet (0.6 mg total) by mouth 2 (two) times daily as needed.   Continuous Blood Gluc Sensor (DEXCOM G7 SENSOR) MISC Change sensor every 10 days   Continuous Blood Gluc Transmit (DEXCOM G6 TRANSMITTER) MISC Use as instructed to check blood sugar. Change every 90 days    cyanocobalamin (VITAMIN B12) 1000 MCG tablet Take 1 tablet (1,000 mcg total) by mouth daily.   denosumab (PROLIA) 60 MG/ML SOSY injection Inject 60 mg into the skin every 6 (six) months.   escitalopram (LEXAPRO) 10 MG tablet Take 10 mg by mouth daily.   fluticasone-salmeterol (ADVAIR DISKUS) 100-50 MCG/ACT AEPB Inhale 1 puff into the lungs 2 (two) times daily.   furosemide (LASIX) 40 MG tablet Take 1 tablet (40 mg total) by mouth daily as needed. If dyspnea, weight gain of 3 lbs or greater, or edema.   Galcanezumab-gnlm (EMGALITY) 120 MG/ML SOAJ INJECT CONTENTS OF 1 PEN INTO THE SKIN ONCE MONTHLY   glucose blood (FREESTYLE LITE) test strip Check blood sugar 2-3 weekly   icosapent Ethyl (VASCEPA) 1 g capsule TAKE 2 CAPSULES TWICE A DAY WITH MEALS   insulin glargine (LANTUS SOLOSTAR) 100 UNIT/ML Solostar Pen Inject 20 Units into the skin 2 (two) times daily.   insulin lispro (HUMALOG KWIKPEN) 100 UNIT/ML KwikPen Max daily 50 units (Patient taking differently: Inject 4-8 Units into the skin 3 (three) times daily.)   Insulin Pen Needle 29G X MISC 1 Device by Does not apply route daily in the afternoon.   lidocaine (LIDODERM) 5 % Place 1 patch onto the skin every 12 (twelve) hours. Remove & Discard patch within 12 hours or as directed by MD   lisinopril (ZESTRIL) 10 MG tablet Take 1 tablet (10 mg total) by mouth daily.   LORazepam (ATIVAN) 1 MG tablet Take 0.5 tablets (0.5 mg total) by mouth 2 (two) times daily.   metFORMIN (GLUCOPHAGE) 500 MG tablet Take 1 tablet (500 mg total) by mouth 2 (two) times daily with a meal. TAKE 1 TABLET(500 MG) BY MOUTH TWICE DAILY AFTER A MEAL (Patient taking differently: Take 500 mg by mouth 2 (two) times daily with a meal.)   metoprolol tartrate (LOPRESSOR) 50 MG tablet Take 1 tablet (50 mg total) by mouth 2 (two) times daily.   morphine (MS CONTIN) 15 MG 12 hr tablet Take 1 tablet (15 mg total) by mouth every 12 (twelve) hours. This is a long acting form, so take 1  tab 2x/day to keep pain more even- it is NOT a short acting pain med like you were taking- for chronic pain   naloxone (NARCAN) nasal spray 4 mg/0.1 mL Place 1 spray into the nose once.   nystatin (MYCOSTATIN/NYSTOP) powder Apply 1 Application topically as needed (rash).   nystatin ointment (MYCOSTATIN) Apply 1 Application topically 2 (two) times daily. (Patient taking differently: Apply 1 Application topically as needed (rash).)   ondansetron (ZOFRAN-ODT) 8 MG disintegrating tablet Take  1 tablet (8 mg total) by mouth every 6 (six) hours as needed for nausea or vomiting.   OXcarbazepine (TRILEPTAL) 150 MG tablet Take 1 tablet (150 mg total) by mouth 2 (two) times daily.   pantoprazole (PROTONIX) 40 MG tablet Take 1 tablet (40 mg total) by mouth daily at 12 noon.   pregabalin (LYRICA) 100 MG capsule Take 1 capsule (100 mg total) by mouth 2 (two) times daily.   senna-docusate (SENOKOT-S) 8.6-50 MG tablet Take 1 tablet by mouth at bedtime. (Patient taking differently: Take 1 tablet by mouth as needed for moderate constipation.)   tirzepatide (MOUNJARO) 5 MG/0.5ML Pen Inject 5 mg into the skin once a week.   Vitamin D, Cholecalciferol, 25 MCG (1000 UT) TABS Take 1,000 Units by mouth daily in the afternoon.   Facility-Administered Encounter Medications as of 09/16/2022  Medication   bupivacaine-epinephrine (MARCAINE W/ EPI) 0.5% -1:200000 injection    Allergies  Allergen Reactions   Iodine Anaphylaxis   Ivp Dye [Iodinated Contrast Media] Anaphylaxis and Swelling    Throat closes    Latex Other (See Comments)    Latex tape pulls skin with it   Tizanidine Other (See Comments)    Weakness, goofy, bad dreams    Review of Systems  Constitutional:  Positive for activity change, appetite change and fatigue. Negative for chills, diaphoresis and fever.  Eyes:  Negative for photophobia and visual disturbance.  Respiratory:  Negative for cough and shortness of breath.   Cardiovascular:  Negative for  chest pain, palpitations and leg swelling.  Gastrointestinal:  Negative for abdominal pain, anal bleeding, blood in stool, constipation, diarrhea and nausea.  Endocrine: Negative for polydipsia, polyphagia and polyuria.  Genitourinary:  Negative for decreased urine volume and difficulty urinating.  Neurological:  Negative for dizziness, tremors, seizures, syncope, facial asymmetry, speech difficulty, weakness, light-headedness, numbness and headaches.  Psychiatric/Behavioral:  Negative for confusion.   All other systems reviewed and are negative.       Objective:  BP 137/74   Pulse (!) 102   Temp 98 F (36.7 C) (Temporal)   Ht 5\' 2"  (1.575 m)   Wt 187 lb 9.6 oz (85.1 kg)   SpO2 90%   BMI 34.31 kg/m    Wt Readings from Last 3 Encounters:  09/16/22 187 lb 9.6 oz (85.1 kg)  09/11/22 187 lb 3.2 oz (84.9 kg)  09/09/22 190 lb (86.2 kg)    Physical Exam Vitals and nursing note reviewed.  Constitutional:      General: She is not in acute distress.    Appearance: She is obese. She is not ill-appearing, toxic-appearing or diaphoretic.  HENT:     Head: Normocephalic and atraumatic.     Mouth/Throat:     Mouth: Mucous membranes are moist.     Pharynx: Oropharynx is clear.  Eyes:     Conjunctiva/sclera: Conjunctivae normal.     Pupils: Pupils are equal, round, and reactive to light.  Cardiovascular:     Rate and Rhythm: Normal rate. Rhythm regularly irregular.     Heart sounds: Normal heart sounds.  Pulmonary:     Effort: Pulmonary effort is normal.     Breath sounds: Normal breath sounds.  Musculoskeletal:     Right lower leg: No edema.     Left lower leg: No edema.  Skin:    General: Skin is warm and dry.     Capillary Refill: Capillary refill takes less than 2 seconds.  Neurological:     General: No focal  deficit present.     Mental Status: She is alert and oriented to person, place, and time.  Psychiatric:        Mood and Affect: Mood normal.        Behavior: Behavior  normal.        Thought Content: Thought content normal.        Judgment: Judgment normal.     Results for orders placed or performed in visit on 09/09/22  Lipid panel  Result Value Ref Range   Cholesterol, Total 123 100 - 199 mg/dL   Triglycerides 284 0 - 149 mg/dL   HDL 34 (L) >13 mg/dL   VLDL Cholesterol Cal 23 5 - 40 mg/dL   LDL Chol Calc (NIH) 66 0 - 99 mg/dL   Chol/HDL Ratio 3.6 0.0 - 4.4 ratio  ALT  Result Value Ref Range   ALT 8 0 - 32 IU/L   *Note: Due to a large number of results and/or encounters for the requested time period, some results have not been displayed. A complete set of results can be found in Results Review.       Pertinent labs & imaging results that were available during my care of the patient were reviewed by me and considered in my medical decision making.  Assessment & Plan:  Najwa was seen today for hospitalization follow-up.  Diagnoses and all orders for this visit:  Hospital discharge follow-up Today's visit was for Transitional Care Management. The patient was discharged from [facility] on 09/04/2022 with a primary diagnosis of hypotension and acute toxic metabolic encephalopathy.  Contact with the patient and/or caregiver, by a clinical staff member, was made on 09/05/2022 and was documented as a telephone encounter within the EMR. Through chart review and discussion with the patient I have determined that management of their condition is of high complexity.  -     CBC with Differential/Platelet -     CMP14+EGFR  Toxic metabolic encephalopathy Doing much better since being home. Will repeat labs today.  -     CBC with Differential/Platelet -     CMP14+EGFR  Other specified hypotension BP normal in office today. Will recheck labs today.  -     CBC with Differential/Platelet -     CMP14+EGFR  Polypharmacy Medication regimen was changed upon discharge and by her specialists. Lyrica was decreased to twice daily dosing, ativan was  decreased to twice daily dosing, tegretol was changed to oxcarb twice daily. Belsomra and Doxepin were stopped. Pt aware she needs to keep follow up with specialists and PCP for further medication management.  -     CBC with Differential/Platelet -     CMP14+EGFR     Continue all other maintenance medications.  Follow up plan: Make an appointment to see PCP in the next 2-3 weeks   Continue healthy lifestyle choices, including diet (rich in fruits, vegetables, and lean proteins, and low in salt and simple carbohydrates) and exercise (at least 30 minutes of moderate physical activity daily).    The above assessment and management plan was discussed with the patient. The patient verbalized understanding of and has agreed to the management plan. Patient is aware to call the clinic if they develop any new symptoms or if symptoms persist or worsen. Patient is aware when to return to the clinic for a follow-up visit. Patient educated on when it is appropriate to go to the emergency department.   Kari Baars, FNP-C [facility] [phone]

## 2022-09-17 LAB — CBC WITH DIFFERENTIAL/PLATELET
Basos: 1 %
EOS (ABSOLUTE): 0.2 10*3/uL (ref 0.0–0.4)
Eos: 2 %
Hematocrit: 43.1 % (ref 34.0–46.6)
Hemoglobin: 13.9 g/dL (ref 11.1–15.9)
Immature Granulocytes: 1 %
Lymphs: 29 %
MCH: 28.5 pg (ref 26.6–33.0)
MCHC: 32.3 g/dL (ref 31.5–35.7)
MCV: 88 fL (ref 79–97)
Neutrophils: 60 %
Platelets: 197 10*3/uL (ref 150–450)
RBC: 4.88 x10E6/uL (ref 3.77–5.28)
WBC: 9.4 10*3/uL (ref 3.4–10.8)

## 2022-09-17 LAB — CMP14+EGFR
Albumin: 4.1 g/dL (ref 3.8–4.8)
Alkaline Phosphatase: 146 IU/L — ABNORMAL HIGH (ref 44–121)
BUN/Creatinine Ratio: 18 (ref 12–28)
Bilirubin Total: 0.5 mg/dL (ref 0.0–1.2)
CO2: 23 mmol/L (ref 20–29)
Glucose: 223 mg/dL — ABNORMAL HIGH (ref 70–99)
Sodium: 142 mmol/L (ref 134–144)
Total Protein: 7 g/dL (ref 6.0–8.5)
eGFR: 69 mL/min/{1.73_m2} (ref >59)

## 2022-09-22 ENCOUNTER — Encounter: Payer: Self-pay | Admitting: Physical Medicine and Rehabilitation

## 2022-09-22 ENCOUNTER — Encounter
Payer: Medicare Other | Attending: Physical Medicine and Rehabilitation | Admitting: Physical Medicine and Rehabilitation

## 2022-09-22 VITALS — BP 130/82 | HR 73 | Ht 62.0 in | Wt 193.6 lb

## 2022-09-22 DIAGNOSIS — G894 Chronic pain syndrome: Secondary | ICD-10-CM | POA: Diagnosis not present

## 2022-09-22 DIAGNOSIS — M7918 Myalgia, other site: Secondary | ICD-10-CM | POA: Insufficient documentation

## 2022-09-22 DIAGNOSIS — M797 Fibromyalgia: Secondary | ICD-10-CM | POA: Insufficient documentation

## 2022-09-22 MED ORDER — LIDOCAINE HCL 1 % IJ SOLN
6.0000 mL | Freq: Once | INTRAMUSCULAR | Status: AC
Start: 2022-09-22 — End: 2022-09-22
  Administered 2022-09-22: 6 mL

## 2022-09-22 MED ORDER — MORPHINE SULFATE ER 15 MG PO TBCR: 15.0000 mg | EXTENDED_RELEASE_TABLET | Freq: Two times a day (BID) | ORAL | 0 refills | Status: DC

## 2022-09-22 NOTE — Progress Notes (Signed)
Patient is a 77 yr old female with hx of  CAD, pulmonary HTN- and DM- with CKD- Stage III here for right low back pain with sciatica  As well as upper back/neck pain/myofascial pain. Pt is here for chronic pain - doesn't want TrP injections today.       Was admitted to [facility] 4/1 for confusion/balance and falls- initially thought had Sepsis;  Fixed her meds- was admitted to hospital with Acute toxic encephalopathy and hypotension and polypharmacy-  They were affecting memory and balance, causing falls.   Has sleep study tomorrow- to get sleep apnea treated- to get an implant- to treat sleep apnea.   Hasn't seen Psych yet- has appointment next month with Psych.  Made all med changes except Psychiatry med changes.   Pain "not good".  Had to use more heat and stretching now- 1x/day.  Still hurts- radiating down R leg- down to lower thigh, not quite to knee.   Hasn't seen since I started Morphine- no one had dilaudid- and doing much better on MS Contin 8am and 8pm.   Doing better since started on that regimen.   But they took other meds off- to help with polypharmacy.   When fell, fell really hard- Had pain on R side of chest- around R breast.   Exam: Point TTP over R breast midline at costochondral junction at sternum  Plan: Will con't MS Contin- 15 mg 2x/day- for chronic pain- sent in refill- was due.   2. Referral to Dr Aleen Sells- for soft OMT-  appears rib out on R side- around middle of R breast- Sports Medicine-   3. Patient here for trigger point injections for myofascial pain  Consent done and on chart.  Cleaned areas with alcohol and injected using a 27 gauge 1.5 inch needle  Injected 6cc- none wasted Using 1% Lidocaine with no EPI  Upper traps B/L x2  Levators- B/L  Posterior scalenes Middle scalenes- B/L  Splenius Capitus Pectoralis Major Rhomboids- B/L x2 Infraspinatus Teres Major/minor Thoracic paraspinals Lumbar paraspinals- B/L x2 Other  injections-    Patient's level of pain prior was 9/10 Current level of pain after injections is has calmed down a little bit  There was no bleeding or complications.  Patient was advised to drink a lot of water on day after injections to flush system Will have increased soreness for 12-48 hours after injections.  Can use Lidocaine patches the day AFTER injections Can use theracane on day of injections in places didn't inject Can use heating pad 4-6 hours AFTER injections   4.  F/U in 3 months- for possible trigger point injections and f/u on chronic pain.    I spent a total of 32    minutes on total care today- >50% coordination of care- due to 10 minutes on injections and rest discussing detailing of OMT and what it is?

## 2022-09-22 NOTE — Patient Instructions (Signed)
Plan: Will con't MS Contin- 15 mg 2x/day- for chronic pain- sent in refill- was due.   2. Referral to Dr Aleen Sells- for soft OMT-  appears rib out on R side- around middle of R breast- Sports Medicine-   3. Patient here for trigger point injections for myofascial pain  Consent done and on chart.  Cleaned areas with alcohol and injected using a 27 gauge 1.5 inch needle  Injected 6cc- none wasted Using 1% Lidocaine with no EPI  Upper traps B/L x2  Levators- B/L  Posterior scalenes Middle scalenes- B/L  Splenius Capitus Pectoralis Major Rhomboids- B/L x2 Infraspinatus Teres Major/minor Thoracic paraspinals Lumbar paraspinals- B/L x2 Other injections-    Patient's level of pain prior was 9/10 Current level of pain after injections is has calmed down a little bit  There was no bleeding or complications.  Patient was advised to drink a lot of water on day after injections to flush system Will have increased soreness for 12-48 hours after injections.  Can use Lidocaine patches the day AFTER injections Can use theracane on day of injections in places didn't inject Can use heating pad 4-6 hours AFTER injections   4.  F/U in 3 months- for possible trigger point injections and f/u on chronic pain.

## 2022-09-22 NOTE — Addendum Note (Signed)
Addended by: Silas Sacramento T on: 09/22/2022 03:22 PM   Modules accepted: Orders

## 2022-09-23 ENCOUNTER — Ambulatory Visit: Payer: Medicare Other

## 2022-09-23 DIAGNOSIS — G4733 Obstructive sleep apnea (adult) (pediatric): Secondary | ICD-10-CM

## 2022-09-23 NOTE — Progress Notes (Unsigned)
Amber Stephenson D.[facility] [address] [place] [address] Phone: [phone]   Assessment and Plan:     There are no diagnoses linked to this encounter.  ***   Pertinent previous records reviewed include ***   Follow Up: ***     Subjective:   I, Amber Stephenson, am serving as a Neurosurgeon for Doctor Richardean Sale  Chief Complaint: rib pain   HPI:   09/24/2022 Patient is a 77 year old female complaining of rib pain. Patient states  Relevant Historical Information: ***  Additional pertinent review of systems negative.   Current Outpatient Medications:    Acetaminophen (TYLENOL PO), Take 2 tablets by mouth as needed (pain/headache)., Disp: , Rfl:    albuterol (PROVENTIL) (2.5 MG/3ML) 0.083% nebulizer solution, Take 3 mLs (2.5 mg total) by nebulization every 6 (six) hours as needed for wheezing or shortness of breath., Disp: 150 mL, Rfl: 0   albuterol (VENTOLIN HFA) 108 (90 Base) MCG/ACT inhaler, Inhale 2 puffs into the lungs every 6 (six) hours as needed for wheezing or shortness of breath., Disp: 1 each, Rfl: 0   apixaban (ELIQUIS) 5 MG TABS tablet, Take 1 tablet (5 mg total) by mouth 2 (two) times daily., Disp: 180 tablet, Rfl: 3   ARIPiprazole (ABILIFY) 2 MG tablet, Take 2 mg by mouth at bedtime., Disp: , Rfl:    atorvastatin (LIPITOR) 40 MG tablet, TAKE 1 TABLET(40 MG) BY MOUTH DAILY (NEEDS TO BE SEEN BEFORE NEXT REFILL), Disp: 30 tablet, Rfl: 0   benztropine (COGENTIN) 1 MG tablet, TAKE 1 TABLET(1 MG) BY MOUTH TWICE DAILY, Disp: 180 tablet, Rfl: 0   colchicine 0.6 MG tablet, Take 1 tablet (0.6 mg total) by mouth 2 (two) times daily as needed., Disp: 30 tablet, Rfl: 1   Continuous Blood Gluc Sensor (DEXCOM G7 SENSOR) MISC, Change sensor every 10 days, Disp: 9 each, Rfl: 3   Continuous Blood Gluc Transmit (DEXCOM G6 TRANSMITTER) MISC, Use as instructed to check blood sugar. Change every 90 days, Disp: 1 each, Rfl: 1   cyanocobalamin  (VITAMIN B12) 1000 MCG tablet, Take 1 tablet (1,000 mcg total) by mouth daily., Disp: 30 tablet, Rfl: 1   denosumab (PROLIA) 60 MG/ML SOSY injection, Inject 60 mg into the skin every 6 (six) months., Disp: 1 mL, Rfl: 1   escitalopram (LEXAPRO) 10 MG tablet, Take 10 mg by mouth daily., Disp: , Rfl:    fluticasone-salmeterol (ADVAIR DISKUS) 100-50 MCG/ACT AEPB, Inhale 1 puff into the lungs 2 (two) times daily., Disp: 1 each, Rfl: 5   furosemide (LASIX) 40 MG tablet, Take 1 tablet (40 mg total) by mouth daily as needed. If dyspnea, weight gain of 3 lbs or greater, or edema., Disp: 30 tablet, Rfl: 0   Galcanezumab-gnlm (EMGALITY) 120 MG/ML SOAJ, INJECT CONTENTS OF 1 PEN INTO THE SKIN ONCE MONTHLY, Disp: 1 mL, Rfl: 0   glucose blood (FREESTYLE LITE) test strip, Check blood sugar 2-3 weekly, Disp: 100 each, Rfl: 5   icosapent Ethyl (VASCEPA) 1 g capsule, TAKE 2 CAPSULES TWICE A DAY WITH MEALS, Disp: 360 capsule, Rfl: 0   insulin glargine (LANTUS SOLOSTAR) 100 UNIT/ML Solostar Pen, Inject 20 Units into the skin 2 (two) times daily., Disp: 75 mL, Rfl: 3   insulin lispro (HUMALOG KWIKPEN) 100 UNIT/ML KwikPen, Max daily 50 units (Patient taking differently: Inject 4-8 Units into the skin 3 (three) times daily.), Disp: 45 mL, Rfl: 6   Insulin Pen Needle 29G X  MISC, 1 Device by Does not apply route daily in the afternoon., Disp: 400 each, Rfl: 3   lidocaine (LIDODERM) 5 %, Place 1 patch onto the skin every 12 (twelve) hours. Remove & Discard patch within 12 hours or as directed by MD, Disp: 10 patch, Rfl: 0   lisinopril (ZESTRIL) 10 MG tablet, Take 1 tablet (10 mg total) by mouth daily., Disp: 90 tablet, Rfl: 3   LORazepam (ATIVAN) 1 MG tablet, Take 0.5 tablets (0.5 mg total) by mouth 2 (two) times daily., Disp: 90 tablet, Rfl: 0   metFORMIN (GLUCOPHAGE) 500 MG tablet, Take 1 tablet (500 mg total) by mouth 2 (two) times daily with a meal. TAKE 1 TABLET(500 MG) BY MOUTH TWICE DAILY AFTER A MEAL (Patient taking  differently: Take 500 mg by mouth 2 (two) times daily with a meal.), Disp: 180 tablet, Rfl: 3   metoprolol tartrate (LOPRESSOR) 50 MG tablet, Take 1 tablet (50 mg total) by mouth 2 (two) times daily., Disp: 60 tablet, Rfl: 3   morphine (MS CONTIN) 15 MG 12 hr tablet, Take 1 tablet (15 mg total) by mouth every 12 (twelve) hours. For chronic pain, Disp: 60 tablet, Rfl: 0   naloxone (NARCAN) nasal spray 4 mg/0.1 mL, Place 1 spray into the nose once., Disp: , Rfl:    nystatin (MYCOSTATIN/NYSTOP) powder, Apply 1 Application topically as needed (rash)., Disp: 15 g, Rfl: 2   nystatin ointment (MYCOSTATIN), Apply 1 Application topically 2 (two) times daily. (Patient taking differently: Apply 1 Application topically as needed (rash).), Disp: 30 g, Rfl: 3   ondansetron (ZOFRAN-ODT) 8 MG disintegrating tablet, Take 1 tablet (8 mg total) by mouth every 6 (six) hours as needed for nausea or vomiting., Disp: 20 tablet, Rfl: 1   OXcarbazepine (TRILEPTAL) 150 MG tablet, Take 1 tablet (150 mg total) by mouth 2 (two) times daily., Disp: 60 tablet, Rfl: 0   pantoprazole (PROTONIX) 40 MG tablet, Take 1 tablet (40 mg total) by mouth daily at 12 noon., Disp: 30 tablet, Rfl: 1   pregabalin (LYRICA) 100 MG capsule, Take 1 capsule (100 mg total) by mouth 2 (two) times daily., Disp: 60 capsule, Rfl: 1   senna-docusate (SENOKOT-S) 8.6-50 MG tablet, Take 1 tablet by mouth at bedtime. (Patient taking differently: Take 1 tablet by mouth as needed for moderate constipation.), Disp: 30 tablet, Rfl: 0   tirzepatide (MOUNJARO) 5 MG/0.5ML Pen, Inject 5 mg into the skin once a week., Disp: 2 mL, Rfl: 2   Vitamin D, Cholecalciferol, 25 MCG (1000 UT) TABS, Take 1,000 Units by mouth daily in the afternoon., Disp: , Rfl:  No current facility-administered medications for this visit.  Facility-Administered Medications Ordered in Other Visits:    bupivacaine-epinephrine (MARCAINE W/ EPI) 0.5% -1:200000 injection, , , Anesthesia Intra-op,  Shelton Silvas, MD, 12 mL at 01/21/19 0935   Objective:     There were no vitals filed for this visit.    There is no height or weight on file to calculate BMI.    Physical Exam:    ***   Electronically signed by:  Amber Stephenson D.[facility] 4:18 PM 09/23/22

## 2022-09-24 ENCOUNTER — Ambulatory Visit (INDEPENDENT_AMBULATORY_CARE_PROVIDER_SITE_OTHER): Payer: Medicare Other | Admitting: Sports Medicine

## 2022-09-24 VITALS — HR 73 | Ht 62.0 in | Wt 192.0 lb

## 2022-09-24 DIAGNOSIS — M9908 Segmental and somatic dysfunction of rib cage: Secondary | ICD-10-CM

## 2022-09-24 DIAGNOSIS — M2550 Pain in unspecified joint: Secondary | ICD-10-CM

## 2022-09-24 DIAGNOSIS — M9902 Segmental and somatic dysfunction of thoracic region: Secondary | ICD-10-CM | POA: Diagnosis not present

## 2022-09-24 DIAGNOSIS — M546 Pain in thoracic spine: Secondary | ICD-10-CM

## 2022-09-24 DIAGNOSIS — M9903 Segmental and somatic dysfunction of lumbar region: Secondary | ICD-10-CM | POA: Diagnosis not present

## 2022-09-24 DIAGNOSIS — R079 Chest pain, unspecified: Secondary | ICD-10-CM | POA: Diagnosis not present

## 2022-09-24 NOTE — Patient Instructions (Addendum)
Good to see you  Recommend doing breathing exercises ,several times a day  3 week follow up [facility]

## 2022-09-25 DIAGNOSIS — G4733 Obstructive sleep apnea (adult) (pediatric): Secondary | ICD-10-CM | POA: Diagnosis not present

## 2022-09-29 ENCOUNTER — Other Ambulatory Visit (HOSPITAL_COMMUNITY): Payer: Self-pay

## 2022-10-02 DIAGNOSIS — E1142 Type 2 diabetes mellitus with diabetic polyneuropathy: Secondary | ICD-10-CM | POA: Diagnosis not present

## 2022-10-02 DIAGNOSIS — L84 Corns and callosities: Secondary | ICD-10-CM | POA: Diagnosis not present

## 2022-10-02 DIAGNOSIS — M79676 Pain in unspecified toe(s): Secondary | ICD-10-CM | POA: Diagnosis not present

## 2022-10-02 DIAGNOSIS — B351 Tinea unguium: Secondary | ICD-10-CM | POA: Diagnosis not present

## 2022-10-03 ENCOUNTER — Ambulatory Visit (INDEPENDENT_AMBULATORY_CARE_PROVIDER_SITE_OTHER): Payer: Medicare Other | Admitting: Physician Assistant

## 2022-10-03 ENCOUNTER — Encounter: Payer: Self-pay | Admitting: Physician Assistant

## 2022-10-03 ENCOUNTER — Ambulatory Visit: Payer: Medicare Other | Admitting: Dietician

## 2022-10-03 DIAGNOSIS — G3184 Mild cognitive impairment, so stated: Secondary | ICD-10-CM | POA: Diagnosis not present

## 2022-10-03 NOTE — Patient Instructions (Addendum)
  Follow up in Oct 1 at 11:30  Continue to control mood as per PCP and Psychiatry, monitor polypharmacy Recommend close follow up with Pulmonary for increase sleepiness during the day

## 2022-10-03 NOTE — Progress Notes (Signed)
Assessment/Plan:   Mild Cognitive Impairment   Amber Stephenson is a very pleasant 77 y.o. RH female presenting today in follow-up for evaluation of memory loss She had a neuropsychological evaluation on 08/08/22 but given the extensive medical and psychiatric history along with polypharmacy, the test was unable to be conclusive for AD, thus, LP for AD diagnosis has been discussed for clarity of diagnosis, which patient declines at this time .No antidementia medication is indicated at this time. During this visit patient is hyper somnolent and distracted, unable to participate on the visit.      Recommendations:   Follow up in  6 months. Recommend good control of cardiovascular risk factors Continue to control mood as per PCP and Psychiatry, monitor polypharmacy, once able to participate on her visit, will be able to assess her memory properly. She was on donepezil 10 mg daily but has not been compliant with the medication  Follow up with Pulmonary for daytime somnolence. She has a history of OSA but not on CPAP.     Subjective:   This patient is accompanied in the office by her husband  who supplements the history. Previous records as well as any outside records available were reviewed prior to todays visit.   Patient was last seen for memory on  06/04/22     Any changes in memory since last visit? "worse, especially STM"husband says. Patient has some difficulty remembering recent conversations and people's names , but does have increased daytime somnolence which may be affecting her memory repeats oneself?  Endorsed Disoriented when walking into a room?  Patient denies   Leaving objects in unusual places?  Endorsed  Wandering behavior?   denies   Any personality changes since last visit?   denies . "Later this month there will be some psych meds changes"-husband says   Any worsening depression?: denies   Hallucinations or paranoia?  Endorsed, " she has seen her mom and plays cards  with her, especially if she is in a sleep mode. Sometimes she senses people but this is when she is awake".  Seizures?   denies    Any sleep changes? "Daytime sleepiness  although she sleeps well at night"  Denies  vivid dreams, REM behavior or sleepwalking   Sleep apnea?   denies   Any hygiene concerns?   denies   Independent of bathing and dressing?  Endorsed  Does the patient needs help with medications? Husband  is in charge   Who is in charge of the finances? Husband is in charge     Any changes in appetite?  "Almost no appetite" Patient have trouble swallowing?  denies   Does the patient cook?  No   Any headaches?    denies   Vision changes? denies Chronic back pain  Endorsed, unsure if there are any changes in the meds  Ambulates with difficulty?   She has been experiencing frequent falls due to hypotension( negative imaging but with a hospitalization on 09/2022 for same".  Unilateral weakness, numbness or tingling?   denies   Any tremors? "Much improved" Any anosmia?    denies   Any incontinence of urine? No Any bowel dysfunction? No  Patient lives  husband  Does the patient drive? no    Neuropsychological evaluation 08/08/22  Scores across stand-alone and embedded performance validity measures were variable. As such, while there remains the potential that current scores reflect true impairment and progressive decline (see below), these also may reflect Amber Stephenson occasionally  zoning out while attempting cognitive tasks, coupled with significant psychiatric distress, acute on chronic pain, and reported sleep dysfunction. Briefly, results suggested diffuse impairment impacting nearly all assessed cognitive domains. Performances were appropriate across confrontation naming and visuospatial abilities, while some variability was seen across recognition/consolidation aspects of memory.     History on Initial Assessment 07/17/2017: This is a 77 year old right-handed woman with a history of  hypertension, hyperlipidemia, diabetes, atrial fibrillation, chronic pain, bipolar disorder, presenting for evaluation of memory changes. She feels her memory has gotten really bad over the past 2 months. She cannot remember words/word-finding difficulties. She "does not think it's Alzheimer's" but feels like she is not paying attention. Family reminds her that they had told her something already previously. Her husband has to remind her of her medications. They put her pills together in a pillbox. Her husband is in charge of finances. She stopped driving 4 years ago, she denied getting lost when she used to drive. She started noticing memory changes after she was started on a medication after she had her colonoscopy. She has also been dealing with a lot of pain, she states her Percocet dose was cut in half a month ago and this is causing her a lot of issues. She has poor sleep, 3 hours at the most. She was recently started on Trazodone by her psychiatrist, which may be helping. Her father had Alzheimer's disease. She denies any history of significant head injuries, no alcohol use.   She has frequent headaches, around 20 headache days a month with stabbing pain in the frontal region that can last up to 3 days, with associated nausea/vomiting. If she takes medication, pain lasts up to 2 hours. She has dizziness at least 1-2 times a day where it feels like the room is spinning. She has had falls for "no clear reason," last fall was 3 months ago. She has chronic neck and back pain, no focal numbness/tingling/weakness. She has tremors in both hands and has a diagnosis of essential tremor, it is "very difficult to do things," it has affected her handwriting, computer use. No family history of tremors. No bowel/bladder dysfunction or anosmia.      I personally reviewed MRI brain without contrast done 05/13/17 which did not show any acute changes. There was mild diffuse atrophy and mild to moderate chronic  microvascular disease.   Neuropsychological testing in 04/2019 showed primary areas of impairment with processing speed and attention/concentration. 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 She also scored in the mild range of anxiety and depression, and it was felt that the combination of these with daily headaches, dizziness, sleep apnea, and chronic pain, negatively influence day to day cognitive function.    MRI brain 04/28/21  1. No acute intracranial process. 2. Mildly advanced atrophy for age, with a suggestion of slightly greater atrophy in the right temporal lobe.   Significant studies: 4/1 CT head: No acute intracranial abnormality    4/1CT chest: No acute findings-no PNA 4/ EEG: No seizures 4/2 echo: EF 65-70%.         Past Medical History:  Diagnosis Date   Acquired hammer toe 12/06/2020   Allergic rhinitis 02/24/2019   Ambulatory dysfunction 04/23/2020   Bipolar 1 disorder (HCC) 01/23/2015   with GAD, benzo dependence   BPPV (benign paroxysmal positional vertigo) 05/20/2019   CAD (coronary artery disease)    Nonobstructive; Managed by Dr. Purvis Sheffield  Chronic constipation 04/25/2020   Chronic diastolic heart failure (HCC) 05/30/2015   Chronic pain syndrome 08/22/2019   back pain, sacroiliitis   Chronic post-traumatic stress disorder (PTSD) 12/06/2020   Diabetic neuropathy (HCC) 02/06/2016   Dyslipidemia 09/24/2020   Essential hypertension    Fibromyalgia 03/19/2017   Functional diarrhea 10/26/2020   Head trauma 09/17/2020   Hemorrhoids 03/11/2022   Herpes genitalis in women 07/16/2015   Insomnia 01/23/2015   Migraine headache with aura 02/12/2016   Mild intermittent asthma 01/12/2018   Myofascial pain dysfunction syndrome 08/22/2019   Non-alcoholic fatty liver disease 01/12/2018   Opioid dependence (HCC) 12/06/2020   OSA (obstructive sleep apnea) 02/24/2019    10/09/2018 - HST  - AHI 40.6    Osteopenia 12/06/2020   Rx alendronate 35 mg.   Pulmonary hypertension    RLS (restless legs syndrome) 04/27/2015   Tremor, essential 12/11/2021   Type II diabetes mellitus (HCC) 09/24/2020     Past Surgical History:  Procedure Laterality Date   BREAST REDUCTION SURGERY     EYE SURGERY Right    cateracts   HAMMER TOE SURGERY     LEFT HEART CATH AND CORONARY ANGIOGRAPHY N/A 02/03/2018   Procedure: LEFT HEART CATH AND CORONARY ANGIOGRAPHY;  Surgeon: [name], Peter M, MD;  Location: [facility];  Service: Cardiovascular;  Laterality: N/A;   REVERSE SHOULDER ARTHROPLASTY Right 08/19/2019   Procedure: REVERSE SHOULDER ARTHROPLASTY;  Surgeon: Beverely Low, MD;  Location: [facility] ORS;  Service: Orthopedics;  Laterality: Right;  interscalene block   SHOULDER SURGERY Right    "I BROKE MY SHOUDLER   THIGH SURGERY     "TO REMOVE A TUMOR "     PREVIOUS MEDICATIONS: donepezil.  CURRENT MEDICATIONS:  Outpatient Encounter Medications as of 10/03/2022  Medication Sig   Acetaminophen (TYLENOL PO) Take 2 tablets by mouth as needed (pain/headache).   albuterol (PROVENTIL) (2.5 MG/3ML) 0.083% nebulizer solution Take 3 mLs (2.5 mg total) by nebulization every 6 (six) hours as needed for wheezing or shortness of breath.   albuterol (VENTOLIN HFA) 108 (90 Base) MCG/ACT inhaler Inhale 2 puffs into the lungs every 6 (six) hours as needed for wheezing or shortness of breath.   apixaban (ELIQUIS) 5 MG TABS tablet Take 1 tablet (5 mg total) by mouth 2 (two) times daily.   ARIPiprazole (ABILIFY) 2 MG tablet Take 2 mg by mouth at bedtime.   atorvastatin (LIPITOR) 40 MG tablet TAKE 1 TABLET(40 MG) BY MOUTH DAILY (NEEDS TO BE SEEN BEFORE NEXT REFILL)   benztropine (COGENTIN) 1 MG tablet TAKE 1 TABLET(1 MG) BY MOUTH TWICE DAILY   colchicine 0.6 MG tablet Take 1 tablet (0.6 mg total) by mouth 2 (two) times daily as needed.   Continuous Blood Gluc Sensor (DEXCOM G7 SENSOR) MISC Change  sensor every 10 days   Continuous Blood Gluc Transmit (DEXCOM G6 TRANSMITTER) MISC Use as instructed to check blood sugar. Change every 90 days   cyanocobalamin (VITAMIN B12) 1000 MCG tablet Take 1 tablet (1,000 mcg total) by mouth daily.   denosumab (PROLIA) 60 MG/ML SOSY injection Inject 60 mg into the skin every 6 (six) months.   escitalopram (LEXAPRO) 10 MG tablet Take 10 mg by mouth daily.   fluticasone-salmeterol (ADVAIR DISKUS) 100-50 MCG/ACT AEPB Inhale 1 puff into the lungs 2 (two) times daily.   furosemide (LASIX) 40 MG tablet Take 1 tablet (40 mg total) by mouth daily as needed. If dyspnea, weight gain of 3 lbs or greater, or edema.  Galcanezumab-gnlm (EMGALITY) 120 MG/ML SOAJ INJECT CONTENTS OF 1 PEN INTO THE SKIN ONCE MONTHLY   glucose blood (FREESTYLE LITE) test strip Check blood sugar 2-3 weekly   icosapent Ethyl (VASCEPA) 1 g capsule TAKE 2 CAPSULES TWICE A DAY WITH MEALS   insulin glargine (LANTUS SOLOSTAR) 100 UNIT/ML Solostar Pen Inject 20 Units into the skin 2 (two) times daily.   insulin lispro (HUMALOG KWIKPEN) 100 UNIT/ML KwikPen Max daily 50 units (Patient taking differently: Inject 4-8 Units into the skin 3 (three) times daily.)   Insulin Pen Needle 29G X MISC 1 Device by Does not apply route daily in the afternoon.   lidocaine (LIDODERM) 5 % Place 1 patch onto the skin every 12 (twelve) hours. Remove & Discard patch within 12 hours or as directed by MD   lisinopril (ZESTRIL) 10 MG tablet Take 1 tablet (10 mg total) by mouth daily.   LORazepam (ATIVAN) 1 MG tablet Take 0.5 tablets (0.5 mg total) by mouth 2 (two) times daily.   metFORMIN (GLUCOPHAGE) 500 MG tablet Take 1 tablet (500 mg total) by mouth 2 (two) times daily with a meal. TAKE 1 TABLET(500 MG) BY MOUTH TWICE DAILY AFTER A MEAL (Patient taking differently: Take 500 mg by mouth 2 (two) times daily with a meal.)   metoprolol tartrate (LOPRESSOR) 50 MG tablet Take 1 tablet (50 mg total) by mouth 2 (two) times  daily.   morphine (MS CONTIN) 15 MG 12 hr tablet Take 1 tablet (15 mg total) by mouth every 12 (twelve) hours. For chronic pain   naloxone (NARCAN) nasal spray 4 mg/0.1 mL Place 1 spray into the nose once.   nystatin (MYCOSTATIN/NYSTOP) powder Apply 1 Application topically as needed (rash).   nystatin ointment (MYCOSTATIN) Apply 1 Application topically 2 (two) times daily. (Patient taking differently: Apply 1 Application topically as needed (rash).)   ondansetron (ZOFRAN-ODT) 8 MG disintegrating tablet Take 1 tablet (8 mg total) by mouth every 6 (six) hours as needed for nausea or vomiting.   OXcarbazepine (TRILEPTAL) 150 MG tablet Take 1 tablet (150 mg total) by mouth 2 (two) times daily.   pantoprazole (PROTONIX) 40 MG tablet Take 1 tablet (40 mg total) by mouth daily at 12 noon.   pregabalin (LYRICA) 100 MG capsule Take 1 capsule (100 mg total) by mouth 2 (two) times daily.   senna-docusate (SENOKOT-S) 8.6-50 MG tablet Take 1 tablet by mouth at bedtime. (Patient taking differently: Take 1 tablet by mouth as needed for moderate constipation.)   tirzepatide (MOUNJARO) 5 MG/0.5ML Pen Inject 5 mg into the skin once a week.   Vitamin D, Cholecalciferol, 25 MCG (1000 UT) TABS Take 1,000 Units by mouth daily in the afternoon.   Facility-Administered Encounter Medications as of 10/03/2022  Medication   bupivacaine-epinephrine (MARCAINE W/ EPI) 0.5% -1:200000 injection     Objective:     PHYSICAL EXAMINATION:    VITALS:  There were no vitals filed for this visit.  GEN:  The patient appears stated age and is in NAD. HEENT:  Normocephalic, atraumatic.   Neurological examination:  General: NAD, well-groomed, appears stated age. Orientation: The patient is somnolent, unable to answer questions properly. Oriented to person, not to place or date Cranial nerves: There is good facial symmetry.The speech is fluent and clear when responding to questions, then falls asleep again. . No aphasia or  dysarthria. Fund of knowledge is appropriate. Recent memory impaired and remote memory unable to assess.  Attention and concentration are reduced  Unable  to name objects and repeat phrases due to excessive sleepiness during the visit.  Hearing is intact to conversational tone  .     Sensation: Sensation is intact to light touch throughout Motor: Strength is at least antigravity x4 Tremors: none  DTR's 1/4 in UE/LE      01/26/2019   11:00 AM 08/06/2018   11:00 AM  Montreal Cognitive Assessment   Visuospatial/ Executive (0/5) 2 4  Naming (0/3) 3 3  Attention: Read list of digits (0/2) 1 2  Attention: Read list of letters (0/1) 0 1  Attention: Serial 7 subtraction starting at 100 (0/3) 0 1  Language: Repeat phrase (0/2) 1 1  Language : Fluency (0/1) 0 0  Abstraction (0/2) 1 1  Delayed Recall (0/5) 0 4  Orientation (0/6) 3 6  Total 11 23       11/08/2021    7:00 AM 08/28/2020   11:00 AM 01/18/2018    4:00 PM  MMSE - Mini Mental State Exam  Orientation to time 5 4 5   Orientation to Place 5 5 5   Registration 3 3 3   Attention/ Calculation 3 1 3   Recall 3 3 3   Language- name 2 objects 2 2 2   Language- repeat 1 1 1   Language- follow 3 step command 3 3 3   Language- read & follow direction 1 1 1   Write a sentence 1 1 1   Copy design 1 1 1   Total score 28 25 28        Movement examination: Tone: There is normal tone in the UE/LE Abnormal movements:  no tremor.  No myoclonus.  No asterixis.   Coordination:  There is no decremation with RAM's. Normal finger to nose  Gait and Station: She is able to tandem walk a few steps then goes back to her chair as she becomes somnolent again. Need a walker to walk.   Thank you for allowing [place] the opportunity to participate in the care of this nice patient. Please do not hesitate to contact us for any questions or concerns.   Total time spent on today's visit was 27 minutes dedicated to this patient today, preparing to see patient, examining the  patient, ordering tests and/or medications and counseling the patient, documenting clinical information in the EHR or other health record, independently interpreting results and communicating results to the patient/family, discussing treatment and goals, answering patient's questions and coordinating care.  Cc:  Mechele Claude, MD  Marlowe Kays 10/05/2022 6:26 PM

## 2022-10-08 ENCOUNTER — Ambulatory Visit (INDEPENDENT_AMBULATORY_CARE_PROVIDER_SITE_OTHER): Payer: Medicare Other | Admitting: Family Medicine

## 2022-10-08 ENCOUNTER — Encounter: Payer: Self-pay | Admitting: Family Medicine

## 2022-10-08 VITALS — BP 138/78 | HR 89 | Temp 97.6°F | Ht 62.0 in | Wt 192.0 lb

## 2022-10-08 DIAGNOSIS — E1142 Type 2 diabetes mellitus with diabetic polyneuropathy: Secondary | ICD-10-CM | POA: Diagnosis not present

## 2022-10-08 DIAGNOSIS — I2510 Atherosclerotic heart disease of native coronary artery without angina pectoris: Secondary | ICD-10-CM

## 2022-10-08 DIAGNOSIS — F0670 Mild neurocognitive disorder due to known physiological condition without behavioral disturbance: Secondary | ICD-10-CM

## 2022-10-08 DIAGNOSIS — I10 Essential (primary) hypertension: Secondary | ICD-10-CM | POA: Diagnosis not present

## 2022-10-08 DIAGNOSIS — E782 Mixed hyperlipidemia: Secondary | ICD-10-CM

## 2022-10-08 DIAGNOSIS — M109 Gout, unspecified: Secondary | ICD-10-CM

## 2022-10-08 DIAGNOSIS — Z9189 Other specified personal risk factors, not elsewhere classified: Secondary | ICD-10-CM | POA: Diagnosis not present

## 2022-10-08 DIAGNOSIS — Z7984 Long term (current) use of oral hypoglycemic drugs: Secondary | ICD-10-CM

## 2022-10-08 DIAGNOSIS — I482 Chronic atrial fibrillation, unspecified: Secondary | ICD-10-CM | POA: Diagnosis not present

## 2022-10-08 DIAGNOSIS — F319 Bipolar disorder, unspecified: Secondary | ICD-10-CM | POA: Diagnosis not present

## 2022-10-08 DIAGNOSIS — R262 Difficulty in walking, not elsewhere classified: Secondary | ICD-10-CM

## 2022-10-08 MED ORDER — FUROSEMIDE 20 MG PO TABS: 20.0000 mg | ORAL_TABLET | Freq: Every morning | ORAL | 2 refills | Status: DC

## 2022-10-08 MED ORDER — PREGABALIN 50 MG PO CAPS: ORAL_CAPSULE | ORAL | 0 refills | Status: DC

## 2022-10-08 NOTE — Progress Notes (Deleted)
Amber Stephenson D.[facility] [address] [place] [address] Phone: [phone]   Assessment and Plan:     There are no diagnoses linked to this encounter.  ***   Pertinent previous records reviewed include ***   Follow Up: ***     Subjective:   I, Amber Stephenson, am serving as a Neurosurgeon for Doctor Richardean Sale   Chief Complaint: rib pain    HPI:    09/24/2022 Patient is a 77 year old female complaining of rib pain. Patient states Patient is a 77 yr old female with hx of  CAD, pulmonary HTN- and DM- with CKD- Stage III here for right low back pain with sciatica  As well as upper back/neck pain/myofascial pain. Pt is here for chronic pain - doesn't want TrP injections today.         Was admitted to [facility] 4/1 for confusion/balance and falls- initially thought had Sepsis;   Fixed her meds- was admitted to hospital with Acute toxic encephalopathy and hypotension and polypharmacy-  They were affecting memory and balance, causing falls.    Has sleep study tomorrow- to get sleep apnea treated- to get an implant- to treat sleep apnea.    Hasn't seen Psych yet- has appointment next month with Psych.  Made all med changes except Psychiatry med changes.    Pain "not good".  Had to use more heat and stretching now- 1x/day.   Still hurts- radiating down R leg- down to lower thigh, not quite to knee.    Hasn't seen since I started Morphine- no one had dilaudid- and doing much better on MS Contin 8am and 8pm.    Doing better since started on that regimen.    But they took other meds off- to help with polypharmacy.    When fell, fell really hard- Had pain on R side of chest- around R breast.  Would like to discuss OMT      10/09/2022 Patient states      Relevant Historical Information: Chronic atrial fibrillation, hypertension, CAD, CHF, NAFLD, DM type II, bipolar 1, morbid obesity, fibromyalgia, chronic pain  syndrome  Additional pertinent review of systems negative.   Current Outpatient Medications:    Acetaminophen (TYLENOL PO), Take 2 tablets by mouth as needed (pain/headache)., Disp: , Rfl:    albuterol (PROVENTIL) (2.5 MG/3ML) 0.083% nebulizer solution, Take 3 mLs (2.5 mg total) by nebulization every 6 (six) hours as needed for wheezing or shortness of breath., Disp: 150 mL, Rfl: 0   albuterol (VENTOLIN HFA) 108 (90 Base) MCG/ACT inhaler, Inhale 2 puffs into the lungs every 6 (six) hours as needed for wheezing or shortness of breath., Disp: 1 each, Rfl: 0   apixaban (ELIQUIS) 5 MG TABS tablet, Take 1 tablet (5 mg total) by mouth 2 (two) times daily., Disp: 180 tablet, Rfl: 3   ARIPiprazole (ABILIFY) 2 MG tablet, Take 2 mg by mouth at bedtime., Disp: , Rfl:    atorvastatin (LIPITOR) 40 MG tablet, TAKE 1 TABLET(40 MG) BY MOUTH DAILY (NEEDS TO BE SEEN BEFORE NEXT REFILL), Disp: 30 tablet, Rfl: 0   benztropine (COGENTIN) 1 MG tablet, TAKE 1 TABLET(1 MG) BY MOUTH TWICE DAILY, Disp: 180 tablet, Rfl: 0   colchicine 0.6 MG tablet, Take 1 tablet (0.6 mg total) by mouth 2 (two) times daily as needed., Disp: 30 tablet, Rfl: 1   Continuous Blood Gluc Sensor (DEXCOM G7 SENSOR) MISC, Change sensor every 10 days, Disp: 9 each,  Rfl: 3   Continuous Blood Gluc Transmit (DEXCOM G6 TRANSMITTER) MISC, Use as instructed to check blood sugar. Change every 90 days, Disp: 1 each, Rfl: 1   cyanocobalamin (VITAMIN B12) 1000 MCG tablet, Take 1 tablet (1,000 mcg total) by mouth daily., Disp: 30 tablet, Rfl: 1   denosumab (PROLIA) 60 MG/ML SOSY injection, Inject 60 mg into the skin every 6 (six) months., Disp: 1 mL, Rfl: 1   escitalopram (LEXAPRO) 10 MG tablet, Take 10 mg by mouth daily., Disp: , Rfl:    fluticasone-salmeterol (ADVAIR DISKUS) 100-50 MCG/ACT AEPB, Inhale 1 puff into the lungs 2 (two) times daily., Disp: 1 each, Rfl: 5   furosemide (LASIX) 40 MG tablet, Take 1 tablet (40 mg total) by mouth daily as needed. If  dyspnea, weight gain of 3 lbs or greater, or edema., Disp: 30 tablet, Rfl: 0   Galcanezumab-gnlm (EMGALITY) 120 MG/ML SOAJ, INJECT CONTENTS OF 1 PEN INTO THE SKIN ONCE MONTHLY, Disp: 1 mL, Rfl: 0   glucose blood (FREESTYLE LITE) test strip, Check blood sugar 2-3 weekly, Disp: 100 each, Rfl: 5   icosapent Ethyl (VASCEPA) 1 g capsule, TAKE 2 CAPSULES TWICE A DAY WITH MEALS, Disp: 360 capsule, Rfl: 0   insulin glargine (LANTUS SOLOSTAR) 100 UNIT/ML Solostar Pen, Inject 20 Units into the skin 2 (two) times daily., Disp: 75 mL, Rfl: 3   insulin lispro (HUMALOG KWIKPEN) 100 UNIT/ML KwikPen, Max daily 50 units (Patient taking differently: Inject 4-8 Units into the skin 3 (three) times daily.), Disp: 45 mL, Rfl: 6   Insulin Pen Needle 29G X MISC, 1 Device by Does not apply route daily in the afternoon., Disp: 400 each, Rfl: 3   lidocaine (LIDODERM) 5 %, Place 1 patch onto the skin every 12 (twelve) hours. Remove & Discard patch within 12 hours or as directed by MD, Disp: 10 patch, Rfl: 0   lisinopril (ZESTRIL) 10 MG tablet, Take 1 tablet (10 mg total) by mouth daily., Disp: 90 tablet, Rfl: 3   LORazepam (ATIVAN) 1 MG tablet, Take 0.5 tablets (0.5 mg total) by mouth 2 (two) times daily., Disp: 90 tablet, Rfl: 0   metFORMIN (GLUCOPHAGE) 500 MG tablet, Take 1 tablet (500 mg total) by mouth 2 (two) times daily with a meal. TAKE 1 TABLET(500 MG) BY MOUTH TWICE DAILY AFTER A MEAL (Patient taking differently: Take 500 mg by mouth 2 (two) times daily with a meal.), Disp: 180 tablet, Rfl: 3   metoprolol tartrate (LOPRESSOR) 50 MG tablet, Take 1 tablet (50 mg total) by mouth 2 (two) times daily., Disp: 60 tablet, Rfl: 3   morphine (MS CONTIN) 15 MG 12 hr tablet, Take 1 tablet (15 mg total) by mouth every 12 (twelve) hours. For chronic pain, Disp: 60 tablet, Rfl: 0   naloxone (NARCAN) nasal spray 4 mg/0.1 mL, Place 1 spray into the nose once., Disp: , Rfl:    nystatin (MYCOSTATIN/NYSTOP) powder, Apply 1 Application  topically as needed (rash)., Disp: 15 g, Rfl: 2   nystatin ointment (MYCOSTATIN), Apply 1 Application topically 2 (two) times daily. (Patient taking differently: Apply 1 Application topically as needed (rash).), Disp: 30 g, Rfl: 3   ondansetron (ZOFRAN-ODT) 8 MG disintegrating tablet, Take 1 tablet (8 mg total) by mouth every 6 (six) hours as needed for nausea or vomiting., Disp: 20 tablet, Rfl: 1   OXcarbazepine (TRILEPTAL) 150 MG tablet, Take 1 tablet (150 mg total) by mouth 2 (two) times daily., Disp: 60 tablet, Rfl: 0   pantoprazole (  PROTONIX) 40 MG tablet, Take 1 tablet (40 mg total) by mouth daily at 12 noon., Disp: 30 tablet, Rfl: 1   pregabalin (LYRICA) 100 MG capsule, Take 1 capsule (100 mg total) by mouth 2 (two) times daily., Disp: 60 capsule, Rfl: 1   senna-docusate (SENOKOT-S) 8.6-50 MG tablet, Take 1 tablet by mouth at bedtime. (Patient taking differently: Take 1 tablet by mouth as needed for moderate constipation.), Disp: 30 tablet, Rfl: 0   tirzepatide (MOUNJARO) 5 MG/0.5ML Pen, Inject 5 mg into the skin once a week., Disp: 2 mL, Rfl: 2   Vitamin D, Cholecalciferol, 25 MCG (1000 UT) TABS, Take 1,000 Units by mouth daily in the afternoon., Disp: , Rfl:  No current facility-administered medications for this visit.  Facility-Administered Medications Ordered in Other Visits:    bupivacaine-epinephrine (MARCAINE W/ EPI) 0.5% -1:200000 injection, , , Anesthesia Intra-op, Shelton Silvas, MD, 12 mL at 01/21/19 0935   Objective:     There were no vitals filed for this visit.    There is no height or weight on file to calculate BMI.    Physical Exam:    ***   Electronically signed by:  Amber Stephenson D.[facility] 7:24 AM 10/08/22

## 2022-10-08 NOTE — Progress Notes (Signed)
Subjective:  Patient ID: Amber Stephenson, female    DOB: 1946-05-07  Age: 77 y.o. MRN: [id number]  CC: Follow-up   HPI Amber Stephenson presents for follow up of hospitalization. Having a lot of falls. Was felt to have polypharmacy issues. She had acute metabolic encephalopathy and hypotension. Dc ed one month ago on 4/4. Here today to consider polypharmacy as related to symptoms, including falling, hypotension, brain fog, and others.       10/08/2022    1:19 PM 08/18/2022    3:18 PM 07/17/2022   12:28 PM  Depression screen PHQ 2/9  Decreased Interest 0 0   Down, Depressed, Hopeless 0 0   PHQ - 2 Score 0 0   Altered sleeping   0  Tired, decreased energy   0  Change in appetite   0  Feeling bad or failure about yourself    0  Trouble concentrating   0  Moving slowly or fidgety/restless   0  Suicidal thoughts   0  Difficult doing work/chores   Not difficult at all    History Amber Stephenson has a past medical history of Acquired hammer toe (12/06/2020), Acute metabolic encephalopathy (06/27/2022), Allergic rhinitis (02/24/2019), Ambulatory dysfunction (04/23/2020), Atrial fibrillation with RVR (HCC) (05/19/2017), Back pain/sacroiliitis--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), Benign paroxysmal positional vertigo due to bilateral vestibular disorder (05/20/2019), Bipolar 1 disorder (HCC) (01/23/2015), BPPV (benign paroxysmal positional vertigo) (05/20/2019), CAD (coronary artery disease), Cardiomegaly (01/12/2018), CHF (congestive heart failure) (06/08/2022), Chronic back pain (01/04/2015), Chronic constipation (04/25/2020), Chronic diastolic heart failure (HCC) (32/44/0102), Chronic pain syndrome (08/22/2019), Chronic pain syndrome (08/22/2019), Chronic post-traumatic stress disorder (PTSD) (12/06/2020), Diabetic neuropathy (HCC) (02/06/2016), Dyslipidemia (09/24/2020), Dyspnea on exertion, Essential hypertension, Family history of coronary  arteriosclerosis (05/30/2015), Fibromyalgia (03/19/2017), Functional diarrhea (10/26/2020), Head trauma (09/17/2020), Hemorrhoids (03/11/2022), Herpes genitalis in women (07/16/2015), History of adenomatous polyp of colon (05/21/2019), Insomnia (01/23/2015), Lipoma (02/08/2015), Migraine headache with aura (02/12/2016), Mild intermittent asthma (01/12/2018), Myofascial pain dysfunction syndrome (08/22/2019), Myofascial pain dysfunction syndrome (08/22/2019), Non-alcoholic fatty liver disease (72/53/6644), Opioid dependence (HCC) (12/06/2020), OSA (obstructive sleep apnea) (02/24/2019), Osteopenia (12/06/2020), Pinguecula (12/06/2020), Postcoital bleeding (12/06/2020), Presbyopia (12/06/2020), Pulmonary hypertension, RLS (restless legs syndrome) (04/27/2015), Superficial fungus infection of skin (09/03/2021), Tremor, essential (12/11/2021), and Type II diabetes mellitus (HCC) (09/24/2020).   She has a past surgical history that includes THIGH SURGERY; Shoulder surgery (Right); Breast reduction surgery; Eye surgery (Right); Hammer toe surgery; LEFT HEART CATH AND CORONARY ANGIOGRAPHY (N/A, 02/03/2018); and Reverse shoulder arthroplasty (Right, 08/19/2019).   Her family history includes Alcohol abuse in her sister; Alzheimer's disease in her father; Diabetes in her brother, mother, and sister; Heart disease in her brother, mother, and sister; Mental illness in her brother; Stroke in her brother.She reports that she has never smoked. She has never used smokeless tobacco. She reports that she does not currently use alcohol. She reports that she does not use drugs.    ROS Review of Systems  Constitutional: Negative.   HENT: Negative.    Eyes:  Negative for visual disturbance.  Respiratory:  Negative for shortness of breath.   Cardiovascular:  Negative for chest pain.  Gastrointestinal:  Negative for abdominal pain.  Musculoskeletal:  Positive for gait problem (balnce deficit). Negative for arthralgias.   Psychiatric/Behavioral:  Positive for confusion.     Objective:  BP 138/78   Pulse 89   Temp 97.6 F (36.4 C)   Ht 5\' 2"  (1.575 m)  Wt 192 lb (87.1 kg)   SpO2 93%   BMI 35.12 kg/m   BP Readings from Last 3 Encounters:  10/09/22 111/76  10/08/22 138/78  09/22/22 130/82    Wt Readings from Last 3 Encounters:  10/08/22 192 lb (87.1 kg)  09/24/22 192 lb (87.1 kg)  09/22/22 193 lb 9.6 oz (87.8 kg)     Physical Exam Constitutional:      General: She is not in acute distress.    Appearance: She is well-developed.  Cardiovascular:     Rate and Rhythm: Normal rate and regular rhythm.  Pulmonary:     Breath sounds: Normal breath sounds.  Musculoskeletal:        General: Normal range of motion.  Skin:    General: Skin is warm and dry.  Neurological:     Mental Status: She is alert and oriented to person, place, and time.  Psychiatric:        Mood and Affect: Mood normal.       Assessment & Plan:   Amber Stephenson was seen today for follow-up.  Diagnoses and all orders for this visit:  Ambulatory dysfunction  At risk for adverse drug event  Bipolar 1 disorder (HCC)  Coronary artery disease involving native coronary artery of native heart without angina pectoris  Chronic atrial fibrillation  Diabetic polyneuropathy associated with type 2 diabetes mellitus (HCC)  Essential hypertension  Mild vascular neurocognitive disorder  Mixed hyperlipidemia  Other orders -     pregabalin (LYRICA) 50 MG capsule; One  qAM with a 100 mg qpm wk 1. 1 BID, wk 2. Then 1 qpm wk 3. -     furosemide (LASIX) 20 MG tablet; Take 1 tablet (20 mg total) by mouth every morning.    Each of her diagnoses was reviewed in detail. They fall under categories of cardiac disease, musculoskeletal disease , Neurological, endocrine and psychiatric primarily. Each category was reviewed and attempts made to decrease her medication by at least one per category where it seemed feasible. She, her  husband and I agreed on tapering others.   Over 40 minutes was spent with the patient most in consultation regarding strategies to decrease the overall number of medications she takes and hopefully improve her ability to function by better balance and ambulation plus clearer thought processes and improved energy. Further cutbacks intended at follow up will be reviewed in light of improvement with the current changes in her extensive regimen.   I have discontinued Amber Stephenson. Wiedel's escitalopram, metFORMIN, atorvastatin, colchicine, FREESTYLE LITE, icosapent Ethyl, benztropine, and lidocaine. I have also changed her pregabalin and furosemide. Additionally, I am having her maintain her Vitamin D (Cholecalciferol), albuterol, Prolia, naloxone, ARIPiprazole, nystatin ointment, ondansetron, lisinopril, Dexcom G6 Transmitter, Dexcom G7 Sensor, insulin lispro, Insulin Pen Needle, Acetaminophen (TYLENOL PO), senna-docusate, OXcarbazepine, nystatin, Emgality, Mounjaro, Lantus SoloStar, LORazepam, pantoprazole, cyanocobalamin, metoprolol tartrate, apixaban, fluticasone-salmeterol, and morphine.  Allergies as of 10/08/2022       Reactions   Iodine Anaphylaxis   Ivp Dye [iodinated Contrast Media] Anaphylaxis, Swelling   Throat closes   Latex Other (See Comments)   Latex tape pulls skin with it   Tizanidine Other (See Comments)   Weakness, goofy, bad dreams        Medication List        Accurate as of Oct 08, 2022 11:59 PM. If you have any questions, ask your nurse or doctor.          STOP taking these medications  atorvastatin 40 MG tablet Commonly known as: LIPITOR Stopped by: Mechele Claude, MD   benztropine 1 MG tablet Commonly known as: COGENTIN Stopped by: Mechele Claude, MD   colchicine 0.6 MG tablet Stopped by: Mechele Claude, MD   escitalopram 10 MG tablet Commonly known as: LEXAPRO Stopped by: Mechele Claude, MD   FREESTYLE LITE test strip Generic drug: glucose  blood Stopped by: Mechele Claude, MD   icosapent Ethyl 1 g capsule Commonly known as: VASCEPA Stopped by: Mechele Claude, MD   lidocaine 5 % Commonly known as: Lidoderm Stopped by: Mechele Claude, MD   metFORMIN 500 MG tablet Commonly known as: GLUCOPHAGE Stopped by: Mechele Claude, MD       TAKE these medications    albuterol 108 (90 Base) MCG/ACT inhaler Commonly known as: VENTOLIN HFA Inhale 2 puffs into the lungs every 6 (six) hours as needed for wheezing or shortness of breath. What changed: Another medication with the same name was removed. Continue taking this medication, and follow the directions you see here. Changed by: Mechele Claude, MD   apixaban 5 MG Tabs tablet Commonly known as: Eliquis Take 1 tablet (5 mg total) by mouth 2 (two) times daily.   ARIPiprazole 2 MG tablet Commonly known as: ABILIFY Take 2 mg by mouth at bedtime.   cyanocobalamin 1000 MCG tablet Commonly known as: VITAMIN B12 Take 1 tablet (1,000 mcg total) by mouth daily.   Dexcom G6 Transmitter Misc Use as instructed to check blood sugar. Change every 90 days   Dexcom G7 Sensor Misc Change sensor every 10 days   Emgality 120 MG/ML Soaj Generic drug: Galcanezumab-gnlm INJECT CONTENTS OF 1 PEN INTO THE SKIN ONCE MONTHLY   fluticasone-salmeterol 100-50 MCG/ACT Aepb Commonly known as: Advair Diskus Inhale 1 puff into the lungs 2 (two) times daily.   furosemide 20 MG tablet Commonly known as: LASIX Take 1 tablet (20 mg total) by mouth every morning. What changed:  medication strength how much to take when to take this reasons to take this additional instructions Changed by: Mechele Claude, MD   insulin lispro 100 UNIT/ML KwikPen Commonly known as: HumaLOG KwikPen Max daily 50 units What changed:  how much to take how to take this when to take this additional instructions   Insulin Pen Needle 29G X Misc 1 Device by Does not apply route daily in the afternoon.   Lantus  SoloStar 100 UNIT/ML Solostar Pen Generic drug: insulin glargine Inject 20 Units into the skin 2 (two) times daily.   lisinopril 10 MG tablet Commonly known as: ZESTRIL Take 1 tablet (10 mg total) by mouth daily.   LORazepam 1 MG tablet Commonly known as: ATIVAN Take 0.5 tablets (0.5 mg total) by mouth 2 (two) times daily.   metoprolol tartrate 50 MG tablet Commonly known as: LOPRESSOR Take 1 tablet (50 mg total) by mouth 2 (two) times daily.   morphine 15 MG 12 hr tablet Commonly known as: MS Contin Take 1 tablet (15 mg total) by mouth every 12 (twelve) hours. For chronic pain   Mounjaro 5 MG/0.5ML Pen Generic drug: tirzepatide Inject 5 mg into the skin once a week.   naloxone 4 MG/0.1ML Liqd nasal spray kit Commonly known as: NARCAN Place 1 spray into the nose once.   nystatin ointment Commonly known as: MYCOSTATIN Apply 1 Application topically 2 (two) times daily. What changed:  when to take this reasons to take this   nystatin powder Commonly known as: MYCOSTATIN/NYSTOP Apply 1 Application topically as  needed (rash). What changed: Another medication with the same name was changed. Make sure you understand how and when to take each.   ondansetron 8 MG disintegrating tablet Commonly known as: ZOFRAN-ODT Take 1 tablet (8 mg total) by mouth every 6 (six) hours as needed for nausea or vomiting.   OXcarbazepine 150 MG tablet Commonly known as: TRILEPTAL Take 1 tablet (150 mg total) by mouth 2 (two) times daily.   pantoprazole 40 MG tablet Commonly known as: PROTONIX Take 1 tablet (40 mg total) by mouth daily at 12 noon.   pregabalin 50 MG capsule Commonly known as: LYRICA One  qAM with a 100 mg qpm wk 1. 1 BID, wk 2. Then 1 qpm wk 3. What changed:  medication strength how much to take how to take this when to take this additional instructions Changed by: Mechele Claude, MD   Prolia 60 MG/ML Sosy injection Generic drug: denosumab Inject 60 mg into the skin  every 6 (six) months.   senna-docusate 8.6-50 MG tablet Commonly known as: Senokot-S Take 1 tablet by mouth at bedtime. What changed:  when to take this reasons to take this   TYLENOL PO Take 2 tablets by mouth as needed (pain/headache).   Vitamin D (Cholecalciferol) 25 MCG (1000 UT) Tabs Take 1,000 Units by mouth daily in the afternoon.         Follow-up: Return in about 1 month (around 11/08/2022).  Mechele Claude, M.D.

## 2022-10-09 ENCOUNTER — Encounter (HOSPITAL_BASED_OUTPATIENT_CLINIC_OR_DEPARTMENT_OTHER): Payer: Self-pay | Admitting: Emergency Medicine

## 2022-10-09 ENCOUNTER — Other Ambulatory Visit (HOSPITAL_BASED_OUTPATIENT_CLINIC_OR_DEPARTMENT_OTHER): Payer: Self-pay

## 2022-10-09 ENCOUNTER — Emergency Department (HOSPITAL_BASED_OUTPATIENT_CLINIC_OR_DEPARTMENT_OTHER)
Admission: EM | Admit: 2022-10-09 | Discharge: 2022-10-09 | Disposition: A | Discharge status: Home / Self Care | Payer: Medicare Other | Attending: Emergency Medicine | Admitting: Emergency Medicine

## 2022-10-09 ENCOUNTER — Ambulatory Visit: Payer: Medicare Other | Admitting: Sports Medicine

## 2022-10-09 ENCOUNTER — Emergency Department (HOSPITAL_BASED_OUTPATIENT_CLINIC_OR_DEPARTMENT_OTHER): Payer: Medicare Other

## 2022-10-09 ENCOUNTER — Other Ambulatory Visit: Payer: Self-pay

## 2022-10-09 DIAGNOSIS — Z043 Encounter for examination and observation following other accident: Secondary | ICD-10-CM | POA: Diagnosis not present

## 2022-10-09 DIAGNOSIS — S0003XA Contusion of scalp, initial encounter: Secondary | ICD-10-CM | POA: Diagnosis not present

## 2022-10-09 DIAGNOSIS — Z7901 Long term (current) use of anticoagulants: Secondary | ICD-10-CM | POA: Diagnosis not present

## 2022-10-09 DIAGNOSIS — Z91040 Latex allergy status: Secondary | ICD-10-CM | POA: Insufficient documentation

## 2022-10-09 DIAGNOSIS — Z794 Long term (current) use of insulin: Secondary | ICD-10-CM | POA: Insufficient documentation

## 2022-10-09 DIAGNOSIS — S0990XA Unspecified injury of head, initial encounter: Secondary | ICD-10-CM

## 2022-10-09 DIAGNOSIS — R519 Headache, unspecified: Secondary | ICD-10-CM | POA: Diagnosis present

## 2022-10-09 DIAGNOSIS — R000 Tachycardia, unspecified: Secondary | ICD-10-CM | POA: Diagnosis not present

## 2022-10-09 DIAGNOSIS — W0110XA Fall on same level from slipping, tripping and stumbling with subsequent striking against unspecified object, initial encounter: Secondary | ICD-10-CM | POA: Diagnosis not present

## 2022-10-09 NOTE — ED Provider Notes (Signed)
[place] EMERGENCY DEPARTMENT AT [facility] Provider Note   CSN: [id number] Arrival date & time: 10/09/22  4098     History  Chief Complaint  Patient presents with   Paulino Rily is a 77 y.o. female with history of A-fib on Eliquis presenting to ED with mechanical fall at home, reports that she twisted her feet together when she tried to get up and she fell backwards, striking the back of her head on the headboard.  She is having pain in the back of her head and her upper neck  HPI     Home Medications Prior to Admission medications   Medication Sig Start Date End Date Taking? Authorizing Provider  Acetaminophen (TYLENOL PO) Take 2 tablets by mouth as needed (pain/headache).    [provider]  albuterol (VENTOLIN HFA) 108 (90 Base) MCG/ACT inhaler Inhale 2 puffs into the lungs every 6 (six) hours as needed for wheezing or shortness of breath. 08/09/20   Sharee Holster, NP  apixaban (ELIQUIS) 5 MG TABS tablet Take 1 tablet (5 mg total) by mouth 2 (two) times daily. 09/09/22   Quintella Reichert, MD  ARIPiprazole (ABILIFY) 2 MG tablet Take 2 mg by mouth at bedtime. 10/07/21   [provider]  Continuous Blood Gluc Sensor (DEXCOM G7 SENSOR) MISC Change sensor every 10 days 05/20/22   Shamleffer, Konrad Dolores, MD  Continuous Blood Gluc Transmit (DEXCOM G6 TRANSMITTER) MISC Use as instructed to check blood sugar. Change every 90 days 05/12/22   Shamleffer, Konrad Dolores, MD  cyanocobalamin (VITAMIN B12) 1000 MCG tablet Take 1 tablet (1,000 mcg total) by mouth daily. 09/04/22   Ghimire, Werner Lean, MD  denosumab (PROLIA) 60 MG/ML SOSY injection Inject 60 mg into the skin every 6 (six) months. 08/14/20   Mechele Claude, MD  fluticasone-salmeterol (ADVAIR DISKUS) 100-50 MCG/ACT AEPB Inhale 1 puff into the lungs 2 (two) times daily. 09/11/22   Parrett, Virgel Bouquet, NP  furosemide (LASIX) 20 MG tablet Take 1 tablet (20 mg total) by mouth every morning. 10/08/22  11/07/22  Mechele Claude, MD  Galcanezumab-gnlm [facility]) 120 MG/ML SOAJ INJECT CONTENTS OF 1 PEN INTO THE SKIN ONCE MONTHLY 08/11/22   Marcos Eke, PA-C  insulin glargine (LANTUS SOLOSTAR) 100 UNIT/ML Solostar Pen Inject 20 Units into the skin 2 (two) times daily. 09/04/22   Ghimire, Werner Lean, MD  insulin lispro (HUMALOG KWIKPEN) 100 UNIT/ML KwikPen Max daily 50 units Patient taking differently: Inject 4-8 Units into the skin 3 (three) times daily. 05/28/22   Shamleffer, Konrad Dolores, MD  Insulin Pen Needle 29G X MISC 1 Device by Does not apply route daily in the afternoon. 05/28/22   Shamleffer, Konrad Dolores, MD  lisinopril (ZESTRIL) 10 MG tablet Take 1 tablet (10 mg total) by mouth daily. 01/14/22   Mechele Claude, MD  LORazepam (ATIVAN) 1 MG tablet Take 0.5 tablets (0.5 mg total) by mouth 2 (two) times daily. 09/04/22   Ghimire, Werner Lean, MD  metoprolol tartrate (LOPRESSOR) 50 MG tablet Take 1 tablet (50 mg total) by mouth 2 (two) times daily. 09/09/22   Quintella Reichert, MD  morphine (MS CONTIN) 15 MG 12 hr tablet Take 1 tablet (15 mg total) by mouth every 12 (twelve) hours. For chronic pain 09/22/22   Lovorn, Aundra Millet, MD  naloxone [facility]) nasal spray 4 mg/0.1 mL Place 1 spray into the nose once. 08/08/21   [provider]  nystatin (MYCOSTATIN/NYSTOP) powder Apply 1 Application topically  as needed (rash). 06/16/22   Shamleffer, Konrad Dolores, MD  nystatin ointment (MYCOSTATIN) Apply 1 Application topically 2 (two) times daily. Patient taking differently: Apply 1 Application topically as needed (rash). 12/27/21   Adline Potter, NP  ondansetron (ZOFRAN-ODT) 8 MG disintegrating tablet Take 1 tablet (8 mg total) by mouth every 6 (six) hours as needed for nausea or vomiting. 01/13/22   Mechele Claude, MD  OXcarbazepine (TRILEPTAL) 150 MG tablet Take 1 tablet (150 mg total) by mouth 2 (two) times daily. 06/13/22   Marguerita Merles Latif, DO  pantoprazole (PROTONIX) 40 MG tablet Take 1  tablet (40 mg total) by mouth daily at 12 noon. 09/04/22   Ghimire, Werner Lean, MD  pregabalin (LYRICA) 50 MG capsule One  qAM with a 100 mg qpm wk 1. 1 BID, wk 2. Then 1 qpm wk 3. 10/08/22   Mechele Claude, MD  senna-docusate (SENOKOT-S) 8.6-50 MG tablet Take 1 tablet by mouth at bedtime. Patient taking differently: Take 1 tablet by mouth as needed for moderate constipation. 06/13/22   Sheikh, Omair Latif, DO  tirzepatide [facility]) 5 MG/0.5ML Pen Inject 5 mg into the skin once a week. 08/19/22   Shamleffer, Konrad Dolores, MD  Vitamin D, Cholecalciferol, 25 MCG (1000 UT) TABS Take 1,000 Units by mouth daily in the afternoon. 07/26/20   [provider]      Allergies    Iodine, Ivp dye [iodinated contrast media], Latex, and Tizanidine    Review of Systems   Review of Systems  Physical Exam Updated Vital Signs BP 111/76   Pulse 100   Temp 98.2 F (36.8 C) (Axillary)   Resp (!) 24   SpO2 96%  Physical Exam Constitutional:      General: She is not in acute distress. HENT:     Head: Normocephalic.     Comments: Small tender hematoma that is nonbleeding over the occipital scalp Eyes:     Conjunctiva/sclera: Conjunctivae normal.     Pupils: Pupils are equal, round, and reactive to light.  Cardiovascular:     Rate and Rhythm: Tachycardia present. Rhythm irregular.  Pulmonary:     Effort: Pulmonary effort is normal. No respiratory distress.  Abdominal:     General: There is no distension.     Tenderness: There is no abdominal tenderness.  Skin:    General: Skin is warm and dry.  Neurological:     General: No focal deficit present.     Mental Status: She is alert. Mental status is at baseline.  Psychiatric:        Mood and Affect: Mood normal.        Behavior: Behavior normal.     ED Results / Procedures / Treatments   Labs (all labs ordered are listed, but only abnormal results are displayed) Labs Reviewed - No data to display  EKG EKG  Interpretation  Date/Time:  Thursday Oct 09 2022 10:10:12 EDT Ventricular Rate:  124 PR Interval:    QRS Duration: 72 QT Interval:  340 QTC Calculation: 485 R Axis:   -37 Text Interpretation: Atrial fibrillation Confirmed by Alvester Chou [phone]) on 10/09/2022 10:18:06 AM  Radiology CT Head Wo Contrast  Result Date: 10/09/2022 CLINICAL DATA:  Larey Seat. Hit head. Posterior scalp hematoma. Patient is on Eliquis. EXAM: CT HEAD WITHOUT CONTRAST CT CERVICAL SPINE WITHOUT CONTRAST TECHNIQUE: Multidetector CT imaging of the head and cervical spine was performed following the standard protocol without intravenous contrast. Multiplanar CT image reconstructions of the cervical spine were also  generated. RADIATION DOSE REDUCTION: This exam was performed according to the departmental dose-optimization program which includes automated exposure control, adjustment of the mA and/or kV according to patient size and/or use of iterative reconstruction technique. COMPARISON:  Prior study 09/01/2022 FINDINGS: CT HEAD FINDINGS Brain: No evidence of acute infarction, hemorrhage, hydrocephalus, extra-axial collection or mass lesion/mass effect. Stable mild age related cerebral atrophy and periventricular white matter disease. Vascular: Vascular calcifications but no aneurysm or hyperdense vessels. Skull: No acute skull fracture or bone lesion. Hyperostosis frontalis interna again noted. Sinuses/Orbits: The paranasal sinuses and mastoid air cells are clear. The globes are intact. Other: Scalp hematoma noted in the right posterior parietal region. No underlying skull fracture. CT CERVICAL SPINE FINDINGS Alignment: Stable straightening of the normal cervical lordosis but the cervical vertebral bodies are grossly normally aligned. There is stable mild degenerative posterior subluxation of C5 on C6. The facets are normally aligned. Moderate right-sided facet disease. Skull base and vertebrae: No acute fracture. No primary bone lesion  or focal pathologic process. Soft tissues and spinal canal: No prevertebral fluid or swelling. No visible canal hematoma. Disc levels: The spinal canal is fairly generous. No significant canal or foraminal stenosis. Upper chest: The lung apices demonstrate chronic underlying emphysematous changes and probable respiratory bronchiolitis. Patchy ground-glass opacities could reflect asymmetric pulmonary edema, reactive airways disease, atypical/viral pneumonia or hypersensitivity pneumonitis. No pneumothorax. Other: No neck mass or adenopathy or hematoma. IMPRESSION: 1. Right posterior parietal scalp hematoma without underlying skull fracture. 2. No acute intracranial findings. 3. Stable age related cerebral atrophy and periventricular white matter disease. 4. No acute cervical spine fracture. 5. Chronic underlying emphysematous changes and probable respiratory bronchiolitis. 6. Patchy ground-glass opacities could reflect asymmetric pulmonary edema, reactive airways disease, atypical/viral pneumonia or hypersensitivity pneumonitis. Emphysema (ICD10-J43.9). Electronically Signed   By: Rudie Meyer M.D.   On: 10/09/2022 11:49   CT Cervical Spine Wo Contrast  Result Date: 10/09/2022 CLINICAL DATA:  Larey Seat. Hit head. Posterior scalp hematoma. Patient is on Eliquis. EXAM: CT HEAD WITHOUT CONTRAST CT CERVICAL SPINE WITHOUT CONTRAST TECHNIQUE: Multidetector CT imaging of the head and cervical spine was performed following the standard protocol without intravenous contrast. Multiplanar CT image reconstructions of the cervical spine were also generated. RADIATION DOSE REDUCTION: This exam was performed according to the departmental dose-optimization program which includes automated exposure control, adjustment of the mA and/or kV according to patient size and/or use of iterative reconstruction technique. COMPARISON:  Prior study 09/01/2022 FINDINGS: CT HEAD FINDINGS Brain: No evidence of acute infarction, hemorrhage,  hydrocephalus, extra-axial collection or mass lesion/mass effect. Stable mild age related cerebral atrophy and periventricular white matter disease. Vascular: Vascular calcifications but no aneurysm or hyperdense vessels. Skull: No acute skull fracture or bone lesion. Hyperostosis frontalis interna again noted. Sinuses/Orbits: The paranasal sinuses and mastoid air cells are clear. The globes are intact. Other: Scalp hematoma noted in the right posterior parietal region. No underlying skull fracture. CT CERVICAL SPINE FINDINGS Alignment: Stable straightening of the normal cervical lordosis but the cervical vertebral bodies are grossly normally aligned. There is stable mild degenerative posterior subluxation of C5 on C6. The facets are normally aligned. Moderate right-sided facet disease. Skull base and vertebrae: No acute fracture. No primary bone lesion or focal pathologic process. Soft tissues and spinal canal: No prevertebral fluid or swelling. No visible canal hematoma. Disc levels: The spinal canal is fairly generous. No significant canal or foraminal stenosis. Upper chest: The lung apices demonstrate chronic underlying emphysematous changes and probable respiratory bronchiolitis.  Patchy ground-glass opacities could reflect asymmetric pulmonary edema, reactive airways disease, atypical/viral pneumonia or hypersensitivity pneumonitis. No pneumothorax. Other: No neck mass or adenopathy or hematoma. IMPRESSION: 1. Right posterior parietal scalp hematoma without underlying skull fracture. 2. No acute intracranial findings. 3. Stable age related cerebral atrophy and periventricular white matter disease. 4. No acute cervical spine fracture. 5. Chronic underlying emphysematous changes and probable respiratory bronchiolitis. 6. Patchy ground-glass opacities could reflect asymmetric pulmonary edema, reactive airways disease, atypical/viral pneumonia or hypersensitivity pneumonitis. Emphysema (ICD10-J43.9). Electronically  Signed   By: Rudie Meyer M.D.   On: 10/09/2022 11:49    Procedures Procedures    Medications Ordered in ED Medications - No data to display  ED Course/ Medical Decision Making/ A&P                             Medical Decision Making Amount and/or Complexity of Data Reviewed Radiology: ordered.   Patient is here with a mechanical fall and an isolated injury to the back of her head.  She has some minor cervical spinal midline and paraspinal tenderness.  CT scans of the head and cervical spine were ordered and reviewed, showing no emergent findings  No other traumatic fracture suspected based on exam.  Patient does have chronic arthralgias.  Her husband is here to provide supplemental history.  Patient is noted to be in A-fib which is ongoing.  Her EKG per my interpretation shows A-fib without acute ischemic findings        Final Clinical Impression(s) / ED Diagnoses Final diagnoses:  Traumatic injury of head with hematoma of scalp    Rx / DC Orders ED Discharge Orders     None         Terald Sleeper, MD 10/09/22 1415

## 2022-10-09 NOTE — ED Notes (Signed)
Discharge paperwork given and verbally understood. 

## 2022-10-09 NOTE — ED Triage Notes (Signed)
Pt arrives to ED with c/o fall. Pt notes she tripped this morning and hit her head on a night stand. She is on eliquis for Afib. She denies LOC/syncope.

## 2022-10-10 ENCOUNTER — Encounter: Payer: Self-pay | Admitting: Family Medicine

## 2022-10-15 ENCOUNTER — Ambulatory Visit: Payer: Medicare Other | Admitting: Cardiology

## 2022-10-15 NOTE — Progress Notes (Signed)
Altered mental status

## 2022-10-16 ENCOUNTER — Ambulatory Visit: Payer: Medicare Other | Admitting: Nutrition

## 2022-10-21 ENCOUNTER — Other Ambulatory Visit: Payer: Self-pay | Admitting: *Deleted

## 2022-10-21 ENCOUNTER — Ambulatory Visit: Payer: Medicare Other | Admitting: Nutrition

## 2022-10-21 MED ORDER — MORPHINE SULFATE ER 15 MG PO TBCR: 15.0000 mg | EXTENDED_RELEASE_TABLET | Freq: Two times a day (BID) | ORAL | 0 refills | Status: DC

## 2022-10-22 ENCOUNTER — Ambulatory Visit (INDEPENDENT_AMBULATORY_CARE_PROVIDER_SITE_OTHER): Payer: Medicare Other | Admitting: Family Medicine

## 2022-10-22 ENCOUNTER — Encounter: Payer: Self-pay | Admitting: Family Medicine

## 2022-10-22 VITALS — BP 122/79 | HR 84 | Temp 98.2°F | Ht 62.0 in | Wt 190.0 lb

## 2022-10-22 DIAGNOSIS — M79672 Pain in left foot: Secondary | ICD-10-CM | POA: Diagnosis not present

## 2022-10-22 DIAGNOSIS — S0990XD Unspecified injury of head, subsequent encounter: Secondary | ICD-10-CM

## 2022-10-22 DIAGNOSIS — M109 Gout, unspecified: Secondary | ICD-10-CM | POA: Diagnosis not present

## 2022-10-22 DIAGNOSIS — W19XXXD Unspecified fall, subsequent encounter: Secondary | ICD-10-CM

## 2022-10-22 DIAGNOSIS — M79671 Pain in right foot: Secondary | ICD-10-CM | POA: Diagnosis not present

## 2022-10-22 DIAGNOSIS — R768 Other specified abnormal immunological findings in serum: Secondary | ICD-10-CM | POA: Diagnosis not present

## 2022-10-22 DIAGNOSIS — E669 Obesity, unspecified: Secondary | ICD-10-CM | POA: Diagnosis not present

## 2022-10-22 DIAGNOSIS — Z6835 Body mass index (BMI) 35.0-35.9, adult: Secondary | ICD-10-CM | POA: Diagnosis not present

## 2022-10-22 DIAGNOSIS — G894 Chronic pain syndrome: Secondary | ICD-10-CM | POA: Diagnosis not present

## 2022-10-22 DIAGNOSIS — T148XXA Other injury of unspecified body region, initial encounter: Secondary | ICD-10-CM

## 2022-10-22 NOTE — Progress Notes (Addendum)
Acute Office Visit  Subjective:  Patient ID: Amber Stephenson, female    DOB: 03/14/1946, 77 y.o.   MRN: [id number]  Chief Complaint  Patient presents with   Acute Visit    Fall - head injury, right rear scalp Cognition deficit, slowly resolving   HPI Patient is in today for follow up post fall. Fell 1.5 weeks ago on 10/08/20. States that her memory and cognition is staying the same. She states that after the fall she was trying to read and the letters were "jumbled". Husband states that there are not "noticeable" differences between before the fall and after. Endorses continued blurry vision and has appt with Dr. [place], opth for this. Denies N/V. Denies headaches. Denies one sided weakness. Denies trouble speaking. States that lump is getting better, but it is still bothersome, itching and painful.   ROS As per HPI  Objective:  BP 122/79   Pulse 84   Temp 98.2 F (36.8 C)   Ht 5\' 2"  (1.575 m)   Wt 190 lb (86.2 kg)   SpO2 93%   BMI 34.75 kg/m   Physical Exam HENT:     Nose: Nose normal.  Eyes:     General:        Right eye: No discharge.        Left eye: No discharge.     Extraocular Movements: Extraocular movements intact.     Conjunctiva/sclera: Conjunctivae normal.     Pupils: Pupils are equal, round, and reactive to light.  Cardiovascular:     Rate and Rhythm: Normal rate. Rhythm irregular.     Heart sounds: No murmur heard. Pulmonary:     Effort: Pulmonary effort is normal. No respiratory distress.     Breath sounds: No stridor. No wheezing, rhonchi or rales.  Chest:     Chest wall: No tenderness.  Musculoskeletal:     Cervical back: Normal range of motion.     Comments: ~1cm x 1cm hard, raised knot on occipital aspect of patient's skull. Tender to palpation. No erythema or irritation at the site.   Skin:    General: Skin is warm.     Capillary Refill: Capillary refill takes less than 2 seconds.  Neurological:     Mental Status: She is alert and oriented to  person, place, and time. Mental status is at baseline.     GCS: GCS eye subscore is 4. GCS verbal subscore is 5. GCS motor subscore is 6.     Cranial Nerves: No cranial nerve deficit or dysarthria.     Sensory: No sensory deficit.     Motor: No weakness or tremor.     Coordination: Coordination normal.     Gait: Gait normal.  Psychiatric:        Mood and Affect: Mood normal.        Behavior: Behavior normal.        Thought Content: Thought content normal.        Judgment: Judgment normal.    Assessment & Plan:  1. Fall, subsequent encounter Patient at baseline per history of patient and husband. Provided instructions for strict return to ED. Educated on red flag symptoms.   2. Hematoma Provided information on how to treat continued hematoma.   The above assessment and management plan was discussed with the patient. The patient verbalized understanding of and has agreed to the management plan using shared-decision making. Patient is aware to call the clinic if they develop any new symptoms or if  symptoms fail to improve or worsen. Patient is aware when to return to the clinic for a follow-up visit. Patient educated on when it is appropriate to go to the emergency department.   Return if symptoms worsen or fail to improve.  Neale Burly, DNP-FNP [facility] [address] [place], [place] [address] [phone]

## 2022-10-23 ENCOUNTER — Ambulatory Visit: Payer: Medicare Other | Admitting: Nutrition

## 2022-10-23 DIAGNOSIS — H5310 Unspecified subjective visual disturbances: Secondary | ICD-10-CM | POA: Diagnosis not present

## 2022-10-24 ENCOUNTER — Telehealth: Payer: Self-pay | Admitting: *Deleted

## 2022-10-24 NOTE — Telephone Encounter (Signed)
Sleep study from 09/23/2022 printed and placed on Tammy's desk for review.

## 2022-10-28 NOTE — Telephone Encounter (Signed)
Can discuss at office visit next month . Shows Moderate OSA .

## 2022-10-29 DIAGNOSIS — F251 Schizoaffective disorder, depressive type: Secondary | ICD-10-CM | POA: Diagnosis not present

## 2022-10-31 ENCOUNTER — Other Ambulatory Visit: Payer: Self-pay | Admitting: Physician Assistant

## 2022-10-31 ENCOUNTER — Telehealth: Payer: Self-pay | Admitting: Physician Assistant

## 2022-10-31 NOTE — Telephone Encounter (Signed)
New message     1. Which medications need to be refilled? (please list name of each medication and dose if known) EMGALITY 120 MG/ML SOAJ   2. Which pharmacy/location (including street and city if local pharmacy) is medication to be sent to?[facility] [phone] - [place], [place] - [address] AT [facility] [address]   3. Do they need a 30 day or 90 day supply? 30 day supply

## 2022-11-02 ENCOUNTER — Emergency Department (HOSPITAL_BASED_OUTPATIENT_CLINIC_OR_DEPARTMENT_OTHER)
Admission: EM | Admit: 2022-11-02 | Discharge: 2022-11-02 | Disposition: A | Discharge status: Home / Self Care | Payer: Medicare Other | Attending: Emergency Medicine | Admitting: Emergency Medicine

## 2022-11-02 ENCOUNTER — Encounter (HOSPITAL_BASED_OUTPATIENT_CLINIC_OR_DEPARTMENT_OTHER): Payer: Self-pay | Admitting: Emergency Medicine

## 2022-11-02 ENCOUNTER — Other Ambulatory Visit: Payer: Self-pay

## 2022-11-02 DIAGNOSIS — R21 Rash and other nonspecific skin eruption: Secondary | ICD-10-CM | POA: Diagnosis present

## 2022-11-02 DIAGNOSIS — Z91040 Latex allergy status: Secondary | ICD-10-CM | POA: Diagnosis not present

## 2022-11-02 DIAGNOSIS — E119 Type 2 diabetes mellitus without complications: Secondary | ICD-10-CM | POA: Diagnosis not present

## 2022-11-02 DIAGNOSIS — I509 Heart failure, unspecified: Secondary | ICD-10-CM | POA: Insufficient documentation

## 2022-11-02 DIAGNOSIS — Z794 Long term (current) use of insulin: Secondary | ICD-10-CM | POA: Diagnosis not present

## 2022-11-02 DIAGNOSIS — Z7901 Long term (current) use of anticoagulants: Secondary | ICD-10-CM | POA: Insufficient documentation

## 2022-11-02 DIAGNOSIS — B029 Zoster without complications: Secondary | ICD-10-CM

## 2022-11-02 MED ORDER — VALACYCLOVIR HCL 1 G PO TABS: 1000.0000 mg | ORAL_TABLET | Freq: Three times a day (TID) | ORAL | 0 refills | Status: DC

## 2022-11-02 MED ORDER — CEPHALEXIN 500 MG PO CAPS: 500.0000 mg | ORAL_CAPSULE | Freq: Four times a day (QID) | ORAL | 0 refills | Status: DC

## 2022-11-02 MED ORDER — GABAPENTIN 100 MG PO CAPS: 100.0000 mg | ORAL_CAPSULE | Freq: Three times a day (TID) | ORAL | 0 refills | Status: DC

## 2022-11-02 NOTE — ED Provider Notes (Signed)
[place] EMERGENCY DEPARTMENT AT [facility] Provider Note   CSN: [id number] Arrival date & time: 11/02/22  4098     History  Chief Complaint  Patient presents with   Rash    Amber Stephenson is a 77 y.o. female.  The history is provided by the patient and medical records. No language interpreter was used.  Rash    77 year old female significant history of diabetes, A-fib currently on Eliquis, pulmonary hypertension, OSA, bipolar, CHF presenting today with complaints of skin changes.  Patient has multiple fall and have landed on her left hip a little over a month ago.  She has a large bruise in that affected area.  She has been seen evaluated for this and has had imaging without any concerning finding.  The bruise has taken a long time to heal and in the past 2 to 3 days she endorsed increasing pain on the skin.  Her husband noticed that she has some redness to the affected area which concerns him.  Patient denies fever or chills denies any recent injury denies any numbness.  Pain is sharp burning throbbing sensation painful to the touch.  Home Medications Prior to Admission medications   Medication Sig Start Date End Date Taking? Authorizing Provider  Acetaminophen (TYLENOL PO) Take 2 tablets by mouth as needed (pain/headache).    [provider]  albuterol (VENTOLIN HFA) 108 (90 Base) MCG/ACT inhaler Inhale 2 puffs into the lungs every 6 (six) hours as needed for wheezing or shortness of breath. 08/09/20   Sharee Holster, NP  apixaban (ELIQUIS) 5 MG TABS tablet Take 1 tablet (5 mg total) by mouth 2 (two) times daily. 09/09/22   Quintella Reichert, MD  ARIPiprazole (ABILIFY) 2 MG tablet Take 2 mg by mouth at bedtime. 10/07/21   [provider]  Continuous Blood Gluc Sensor (DEXCOM G7 SENSOR) MISC Change sensor every 10 days 05/20/22   Shamleffer, Konrad Dolores, MD  Continuous Blood Gluc Transmit (DEXCOM G6 TRANSMITTER) MISC Use as instructed to check blood  sugar. Change every 90 days 05/12/22   Shamleffer, Konrad Dolores, MD  cyanocobalamin (VITAMIN B12) 1000 MCG tablet Take 1 tablet (1,000 mcg total) by mouth daily. 09/04/22   Ghimire, Werner Lean, MD  denosumab (PROLIA) 60 MG/ML SOSY injection Inject 60 mg into the skin every 6 (six) months. 08/14/20   Mechele Claude, MD  fluticasone-salmeterol (ADVAIR DISKUS) 100-50 MCG/ACT AEPB Inhale 1 puff into the lungs 2 (two) times daily. 09/11/22   Parrett, Virgel Bouquet, NP  furosemide (LASIX) 20 MG tablet Take 1 tablet (20 mg total) by mouth every morning. 10/08/22 11/07/22  Mechele Claude, MD  Galcanezumab-gnlm [facility]) 120 MG/ML SOAJ INJECT ONCE A MONTH 10/31/22   Marcos Eke, PA-C  insulin glargine (LANTUS SOLOSTAR) 100 UNIT/ML Solostar Pen Inject 20 Units into the skin 2 (two) times daily. 09/04/22   Ghimire, Werner Lean, MD  insulin lispro (HUMALOG KWIKPEN) 100 UNIT/ML KwikPen Max daily 50 units Patient taking differently: Inject 4-8 Units into the skin 3 (three) times daily. 05/28/22   Shamleffer, Konrad Dolores, MD  Insulin Pen Needle 29G X MISC 1 Device by Does not apply route daily in the afternoon. 05/28/22   Shamleffer, Konrad Dolores, MD  lisinopril (ZESTRIL) 10 MG tablet Take 1 tablet (10 mg total) by mouth daily. 01/14/22   Mechele Claude, MD  LORazepam (ATIVAN) 1 MG tablet Take 0.5 tablets (0.5 mg total) by mouth 2 (two) times daily. 09/04/22   Ghimire, Werner Lean,  MD  metoprolol tartrate (LOPRESSOR) 50 MG tablet Take 1 tablet (50 mg total) by mouth 2 (two) times daily. 09/09/22   Quintella Reichert, MD  morphine (MS CONTIN) 15 MG 12 hr tablet Take 1 tablet (15 mg total) by mouth every 12 (twelve) hours. For chronic pain 10/21/22   Lovorn, Aundra Millet, MD  naloxone [facility]) nasal spray 4 mg/0.1 mL Place 1 spray into the nose once. 08/08/21   [provider]  nystatin (MYCOSTATIN/NYSTOP) powder Apply 1 Application topically as needed (rash). 06/16/22   Shamleffer, Konrad Dolores, MD  nystatin ointment  (MYCOSTATIN) Apply 1 Application topically 2 (two) times daily. Patient taking differently: Apply 1 Application topically as needed (rash). 12/27/21   Adline Potter, NP  ondansetron (ZOFRAN-ODT) 8 MG disintegrating tablet Take 1 tablet (8 mg total) by mouth every 6 (six) hours as needed for nausea or vomiting. 01/13/22   Mechele Claude, MD  OXcarbazepine (TRILEPTAL) 150 MG tablet Take 1 tablet (150 mg total) by mouth 2 (two) times daily. 06/13/22   Marguerita Merles Latif, DO  pantoprazole (PROTONIX) 40 MG tablet Take 1 tablet (40 mg total) by mouth daily at 12 noon. 09/04/22   Ghimire, Werner Lean, MD  pregabalin (LYRICA) 50 MG capsule One  qAM with a 100 mg qpm wk 1. 1 BID, wk 2. Then 1 qpm wk 3. 10/08/22   Mechele Claude, MD  senna-docusate (SENOKOT-S) 8.6-50 MG tablet Take 1 tablet by mouth at bedtime. Patient taking differently: Take 1 tablet by mouth as needed for moderate constipation. 06/13/22   Sheikh, Omair Latif, DO  tirzepatide [facility]) 5 MG/0.5ML Pen Inject 5 mg into the skin once a week. 08/19/22   Shamleffer, Konrad Dolores, MD  Vitamin D, Cholecalciferol, 25 MCG (1000 UT) TABS Take 1,000 Units by mouth daily in the afternoon. 07/26/20   [provider]      Allergies    Iodine, Ivp dye [iodinated contrast media], Latex, and Tizanidine    Review of Systems   Review of Systems  Skin:  Positive for rash.  All other systems reviewed and are negative.   Physical Exam Updated Vital Signs BP 113/71   Pulse 75   Temp 97.8 F (36.6 C) (Axillary) Comment: was eating ice  Resp 20   SpO2 93%  Physical Exam Vitals and nursing note reviewed.  Constitutional:      General: She is not in acute distress.    Appearance: She is well-developed. She is obese.  HENT:     Head: Atraumatic.  Eyes:     Conjunctiva/sclera: Conjunctivae normal.  Pulmonary:     Effort: Pulmonary effort is normal.  Abdominal:     Palpations: Abdomen is soft.     Tenderness: There is no abdominal  tenderness.  Musculoskeletal:     Cervical back: Neck supple.  Skin:    Comments: Left hip: There is a large area of ecchymosis in the healing stages without any new ecchymosis.  Patient does have several scattered patchy erythematous rash following a dermatomal pattern with some deroofed blisters noted.  It is localized to the left belt line and tender to palpation without significant warmth.  No abscess.  Neurological:     Mental Status: She is alert.  Psychiatric:        Mood and Affect: Mood normal.     ED Results / Procedures / Treatments   Labs (all labs ordered are listed, but only abnormal results are displayed) Labs Reviewed - No data to display  EKG None  Radiology No results found.  Procedures Procedures    Medications Ordered in ED Medications - No data to display  ED Course/ Medical Decision Making/ A&P                             Medical Decision Making  BP 113/71   Pulse 75   Temp 97.8 F (36.6 C) (Axillary) Comment: was eating ice  Resp 20   SpO2 93%   45:61 AM 77 year old female significant history of diabetes, A-fib currently on Eliquis, pulmonary hypertension, OSA, bipolar, CHF presenting today with complaints of skin changes.  Patient has multiple fall and have landed on her left hip a little over a month ago.  She has a large bruise in that affected area.  She has been seen evaluated for this and has had imaging without any concerning finding.  The bruise has taken a long time to heal and in the past 2 to 3 days she endorsed increasing pain on the skin.  Her husband noticed that she has some redness to the affected area which concerns him.  Patient denies fever or chills denies any recent injury denies any numbness.  Pain is sharp burning throbbing sensation painful to the touch  On exam this is an obese female sitting in a chair in no acute discomfort.  Examination reveal a fairly large slowly healing ecchymosis to her left hip extending towards the  left lower back.  This is likely chronic.  She has a multiple erythematous scattered patches following a dermatomal pattern along the left beltline with several deroofed blisters.  This is likely shingles versus cellulitis.  At this time plan to prescribe Keflex antibiotic as well as prescribed valacyclovir.  Patient may initiate antibiotic first and if rash not improved, may need to switch over to valacyclovir to treat for suspected shingles.  Doubt worsening ecchymosis.  Care discussed with Dr. Anitra Lauth.    Vital signs overall reassuring.  Imaging including repeat x-ray of left hip was considered but not performed as I have low suspicion for new bony injury.  No petechial rash to suggest vasculitis.  Doubt allergic reaction.  Possible skin irritation from friction rub however the pattern is not consistent.  Patient has opiate pain medication at home that she can continue to take.  If patient return precaution.   9:54 AM Discussed care with Dr. Anitra Lauth who has seen and evaluated patient and agrees this is likely shingles.  Will treat with valacyclovir.  Patient is currently on Lyrica and she can continue with that.       Final Clinical Impression(s) / ED Diagnoses Final diagnoses:  Herpes zoster without complication    Rx / DC Orders ED Discharge Orders          Ordered    cephALEXin (KEFLEX) 500 MG capsule  4 times daily,   Status:  Discontinued        11/02/22 0929    valACYclovir (VALTREX) 1000 MG tablet  3 times daily        11/02/22 0930    gabapentin (NEURONTIN) 100 MG capsule  3 times daily,   Status:  Discontinued        11/02/22 0951              Fayrene Helper, PA-C 11/02/22 4098    Gwyneth Sprout, MD 11/05/22 [phone]

## 2022-11-02 NOTE — Discharge Instructions (Addendum)
Your rash is likely shingles.  Please take valacyclovir as prescribed.  You may take your Lyrica twice daily for pain control while you are having this rash.  Follow-up with your doctor for further care.

## 2022-11-02 NOTE — ED Notes (Signed)
Pt placed in room. Instructed to take off shirt and pants and apply gown.

## 2022-11-02 NOTE — ED Triage Notes (Signed)
Pt fell about a month ago, large bruise on left thigh all the way up her back. Still bruised and now has red rash on the back/legs. Painful to even move shirt.

## 2022-11-03 DIAGNOSIS — Z1283 Encounter for screening for malignant neoplasm of skin: Secondary | ICD-10-CM | POA: Diagnosis not present

## 2022-11-03 DIAGNOSIS — L590 Erythema ab igne [dermatitis ab igne]: Secondary | ICD-10-CM | POA: Diagnosis not present

## 2022-11-03 DIAGNOSIS — D225 Melanocytic nevi of trunk: Secondary | ICD-10-CM | POA: Diagnosis not present

## 2022-11-06 ENCOUNTER — Telehealth: Payer: Self-pay

## 2022-11-06 NOTE — Telephone Encounter (Signed)
Transition Care Management Unsuccessful Follow-up Telephone Call  Date of discharge and from where:  11/02/2022 [facility]  Attempts:  1st Attempt  Reason for unsuccessful TCM follow-up call:  No answer/busy  Tayson Schnelle [facility]  [facility] Population Health Community Resource Care Guide   [email or web address]  ?? [phone]   Website: [email or web address]  [email or web address]

## 2022-11-07 ENCOUNTER — Telehealth: Payer: Self-pay

## 2022-11-07 NOTE — Telephone Encounter (Signed)
Transition Care Management Unsuccessful Follow-up Telephone Call  Date of discharge and from where:  11/07/2022 [facility]  Attempts:  2nd Attempt  Reason for unsuccessful TCM follow-up call:  No answer/busy  Alaisha Eversley [facility]  [facility] Population Health Community Resource Care Guide   [email or web address]  ?? [phone]   Website: [email or web address]  [email or web address]

## 2022-11-09 ENCOUNTER — Other Ambulatory Visit: Payer: Self-pay

## 2022-11-09 ENCOUNTER — Emergency Department (HOSPITAL_COMMUNITY): Payer: Medicare Other

## 2022-11-09 ENCOUNTER — Inpatient Hospital Stay (HOSPITAL_COMMUNITY): Payer: Medicare Other

## 2022-11-09 ENCOUNTER — Encounter (HOSPITAL_COMMUNITY): Payer: Self-pay

## 2022-11-09 ENCOUNTER — Inpatient Hospital Stay (HOSPITAL_COMMUNITY)
Admission: EM | Admit: 2022-11-09 | Discharge: 2022-11-11 | DRG: 605 | Disposition: A | Discharge status: Home Health | Payer: Medicare Other | Attending: Internal Medicine | Admitting: Internal Medicine

## 2022-11-09 DIAGNOSIS — N3001 Acute cystitis with hematuria: Secondary | ICD-10-CM | POA: Diagnosis present

## 2022-11-09 DIAGNOSIS — E119 Type 2 diabetes mellitus without complications: Secondary | ICD-10-CM

## 2022-11-09 DIAGNOSIS — Z7901 Long term (current) use of anticoagulants: Secondary | ICD-10-CM

## 2022-11-09 DIAGNOSIS — M5441 Lumbago with sciatica, right side: Secondary | ICD-10-CM | POA: Diagnosis not present

## 2022-11-09 DIAGNOSIS — Y92009 Unspecified place in unspecified non-institutional (private) residence as the place of occurrence of the external cause: Secondary | ICD-10-CM | POA: Diagnosis not present

## 2022-11-09 DIAGNOSIS — I110 Hypertensive heart disease with heart failure: Secondary | ICD-10-CM | POA: Diagnosis present

## 2022-11-09 DIAGNOSIS — T1490XA Injury, unspecified, initial encounter: Secondary | ICD-10-CM | POA: Diagnosis not present

## 2022-11-09 DIAGNOSIS — Z86010 Personal history of colonic polyps: Secondary | ICD-10-CM

## 2022-11-09 DIAGNOSIS — Z96611 Presence of right artificial shoulder joint: Secondary | ICD-10-CM | POA: Diagnosis present

## 2022-11-09 DIAGNOSIS — I482 Chronic atrial fibrillation, unspecified: Secondary | ICD-10-CM | POA: Diagnosis present

## 2022-11-09 DIAGNOSIS — Z818 Family history of other mental and behavioral disorders: Secondary | ICD-10-CM

## 2022-11-09 DIAGNOSIS — I2510 Atherosclerotic heart disease of native coronary artery without angina pectoris: Secondary | ICD-10-CM | POA: Diagnosis present

## 2022-11-09 DIAGNOSIS — I10 Essential (primary) hypertension: Secondary | ICD-10-CM | POA: Diagnosis not present

## 2022-11-09 DIAGNOSIS — Z7951 Long term (current) use of inhaled steroids: Secondary | ICD-10-CM

## 2022-11-09 DIAGNOSIS — E1169 Type 2 diabetes mellitus with other specified complication: Secondary | ICD-10-CM

## 2022-11-09 DIAGNOSIS — E1142 Type 2 diabetes mellitus with diabetic polyneuropathy: Secondary | ICD-10-CM | POA: Diagnosis not present

## 2022-11-09 DIAGNOSIS — W0110XA Fall on same level from slipping, tripping and stumbling with subsequent striking against unspecified object, initial encounter: Secondary | ICD-10-CM | POA: Diagnosis present

## 2022-11-09 DIAGNOSIS — R55 Syncope and collapse: Secondary | ICD-10-CM | POA: Diagnosis not present

## 2022-11-09 DIAGNOSIS — G894 Chronic pain syndrome: Secondary | ICD-10-CM | POA: Diagnosis present

## 2022-11-09 DIAGNOSIS — M8580 Other specified disorders of bone density and structure, unspecified site: Secondary | ICD-10-CM | POA: Diagnosis present

## 2022-11-09 DIAGNOSIS — R296 Repeated falls: Secondary | ICD-10-CM

## 2022-11-09 DIAGNOSIS — I5032 Chronic diastolic (congestive) heart failure: Secondary | ICD-10-CM | POA: Diagnosis present

## 2022-11-09 DIAGNOSIS — Z9181 History of falling: Secondary | ICD-10-CM

## 2022-11-09 DIAGNOSIS — E785 Hyperlipidemia, unspecified: Secondary | ICD-10-CM | POA: Diagnosis present

## 2022-11-09 DIAGNOSIS — Z8249 Family history of ischemic heart disease and other diseases of the circulatory system: Secondary | ICD-10-CM

## 2022-11-09 DIAGNOSIS — Z794 Long term (current) use of insulin: Secondary | ICD-10-CM | POA: Diagnosis not present

## 2022-11-09 DIAGNOSIS — Z833 Family history of diabetes mellitus: Secondary | ICD-10-CM

## 2022-11-09 DIAGNOSIS — S199XXA Unspecified injury of neck, initial encounter: Secondary | ICD-10-CM | POA: Diagnosis not present

## 2022-11-09 DIAGNOSIS — B3731 Acute candidiasis of vulva and vagina: Secondary | ICD-10-CM | POA: Diagnosis present

## 2022-11-09 DIAGNOSIS — N390 Urinary tract infection, site not specified: Secondary | ICD-10-CM | POA: Diagnosis present

## 2022-11-09 DIAGNOSIS — Z79891 Long term (current) use of opiate analgesic: Secondary | ICD-10-CM

## 2022-11-09 DIAGNOSIS — G8929 Other chronic pain: Secondary | ICD-10-CM | POA: Diagnosis present

## 2022-11-09 DIAGNOSIS — Y9301 Activity, walking, marching and hiking: Secondary | ICD-10-CM | POA: Diagnosis present

## 2022-11-09 DIAGNOSIS — K760 Fatty (change of) liver, not elsewhere classified: Secondary | ICD-10-CM | POA: Diagnosis present

## 2022-11-09 DIAGNOSIS — W19XXXA Unspecified fall, initial encounter: Secondary | ICD-10-CM

## 2022-11-09 DIAGNOSIS — E538 Deficiency of other specified B group vitamins: Secondary | ICD-10-CM | POA: Diagnosis present

## 2022-11-09 DIAGNOSIS — Z7985 Long-term (current) use of injectable non-insulin antidiabetic drugs: Secondary | ICD-10-CM

## 2022-11-09 DIAGNOSIS — E669 Obesity, unspecified: Secondary | ICD-10-CM | POA: Diagnosis present

## 2022-11-09 DIAGNOSIS — K591 Functional diarrhea: Secondary | ICD-10-CM | POA: Diagnosis present

## 2022-11-09 DIAGNOSIS — G2581 Restless legs syndrome: Secondary | ICD-10-CM | POA: Diagnosis present

## 2022-11-09 DIAGNOSIS — F319 Bipolar disorder, unspecified: Secondary | ICD-10-CM | POA: Diagnosis not present

## 2022-11-09 DIAGNOSIS — R519 Headache, unspecified: Secondary | ICD-10-CM | POA: Diagnosis not present

## 2022-11-09 DIAGNOSIS — S0003XA Contusion of scalp, initial encounter: Secondary | ICD-10-CM | POA: Diagnosis not present

## 2022-11-09 DIAGNOSIS — Z91041 Radiographic dye allergy status: Secondary | ICD-10-CM

## 2022-11-09 DIAGNOSIS — I272 Pulmonary hypertension, unspecified: Secondary | ICD-10-CM | POA: Diagnosis present

## 2022-11-09 DIAGNOSIS — F4312 Post-traumatic stress disorder, chronic: Secondary | ICD-10-CM | POA: Diagnosis present

## 2022-11-09 DIAGNOSIS — G4733 Obstructive sleep apnea (adult) (pediatric): Secondary | ICD-10-CM | POA: Diagnosis present

## 2022-11-09 DIAGNOSIS — Z91040 Latex allergy status: Secondary | ICD-10-CM

## 2022-11-09 DIAGNOSIS — Z79899 Other long term (current) drug therapy: Secondary | ICD-10-CM

## 2022-11-09 DIAGNOSIS — Z6834 Body mass index (BMI) 34.0-34.9, adult: Secondary | ICD-10-CM

## 2022-11-09 DIAGNOSIS — E1165 Type 2 diabetes mellitus with hyperglycemia: Secondary | ICD-10-CM | POA: Diagnosis present

## 2022-11-09 DIAGNOSIS — M19012 Primary osteoarthritis, left shoulder: Secondary | ICD-10-CM | POA: Diagnosis not present

## 2022-11-09 DIAGNOSIS — E1140 Type 2 diabetes mellitus with diabetic neuropathy, unspecified: Secondary | ICD-10-CM | POA: Diagnosis present

## 2022-11-09 DIAGNOSIS — G250 Essential tremor: Secondary | ICD-10-CM | POA: Diagnosis present

## 2022-11-09 DIAGNOSIS — Z043 Encounter for examination and observation following other accident: Secondary | ICD-10-CM | POA: Diagnosis not present

## 2022-11-09 DIAGNOSIS — Z888 Allergy status to other drugs, medicaments and biological substances status: Secondary | ICD-10-CM

## 2022-11-09 LAB — COMPREHENSIVE METABOLIC PANEL
ALT: 13 U/L (ref 0–44)
AST: 22 U/L (ref 15–41)
Albumin: 3.3 g/dL — ABNORMAL LOW (ref 3.5–5.0)
Alkaline Phosphatase: 106 U/L (ref 38–126)
Anion gap: 13 (ref 5–15)
BUN: 24 mg/dL — ABNORMAL HIGH (ref 8–23)
CO2: 22 mmol/L (ref 22–32)
Calcium: 9.5 mg/dL (ref 8.9–10.3)
Chloride: 96 mmol/L — ABNORMAL LOW (ref 98–111)
Creatinine, Ser: 1.06 mg/dL — ABNORMAL HIGH (ref 0.44–1.00)
GFR, Estimated: 55 mL/min — ABNORMAL LOW (ref >60)
Glucose, Bld: 366 mg/dL — ABNORMAL HIGH (ref 70–99)
Potassium: 4.5 mmol/L (ref 3.5–5.1)
Sodium: 131 mmol/L — ABNORMAL LOW (ref 135–145)
Total Bilirubin: 0.9 mg/dL (ref 0.3–1.2)
Total Protein: 6.8 g/dL (ref 6.5–8.1)

## 2022-11-09 LAB — BASIC METABOLIC PANEL
Anion gap: 11 (ref 5–15)
BUN: 21 mg/dL (ref 8–23)
CO2: 24 mmol/L (ref 22–32)
Calcium: 9 mg/dL (ref 8.9–10.3)
Chloride: 98 mmol/L (ref 98–111)
Creatinine, Ser: 0.88 mg/dL (ref 0.44–1.00)
GFR, Estimated: >60 mL/min (ref >60)
Glucose, Bld: 328 mg/dL — ABNORMAL HIGH (ref 70–99)
Potassium: 4.4 mmol/L (ref 3.5–5.1)
Sodium: 133 mmol/L — ABNORMAL LOW (ref 135–145)

## 2022-11-09 LAB — I-STAT CHEM 8, ED
BUN: 31 mg/dL — ABNORMAL HIGH (ref 8–23)
Calcium, Ion: 1.17 mmol/L (ref 1.15–1.40)
Chloride: 98 mmol/L (ref 98–111)
Creatinine, Ser: 1 mg/dL (ref 0.44–1.00)
Glucose, Bld: 368 mg/dL — ABNORMAL HIGH (ref 70–99)
HCT: 41 % (ref 36.0–46.0)
Hemoglobin: 13.9 g/dL (ref 12.0–15.0)
Potassium: 4.4 mmol/L (ref 3.5–5.1)
Sodium: 132 mmol/L — ABNORMAL LOW (ref 135–145)
TCO2: 26 mmol/L (ref 22–32)

## 2022-11-09 LAB — URINALYSIS, ROUTINE W REFLEX MICROSCOPIC
Bilirubin Urine: NEGATIVE
Glucose, UA: >=500 mg/dL — AB
Ketones, ur: NEGATIVE mg/dL
Nitrite: NEGATIVE
Protein, ur: 30 mg/dL — AB
RBC / HPF: >50 RBC/hpf (ref 0–5)
Specific Gravity, Urine: 1.012 (ref 1.005–1.030)
WBC, UA: >50 WBC/hpf (ref 0–5)
pH: 5 (ref 5.0–8.0)

## 2022-11-09 LAB — CBC
HCT: 40.8 % (ref 36.0–46.0)
HCT: 36.6 % (ref 36.0–46.0)
Hemoglobin: 13.6 g/dL (ref 12.0–15.0)
Hemoglobin: 12.1 g/dL (ref 12.0–15.0)
MCH: 28.8 pg (ref 26.0–34.0)
MCH: 28.6 pg (ref 26.0–34.0)
MCHC: 33.3 g/dL (ref 30.0–36.0)
MCHC: 33.1 g/dL (ref 30.0–36.0)
MCV: 86.4 fL (ref 80.0–100.0)
MCV: 86.5 fL (ref 80.0–100.0)
Platelets: 215 10*3/uL (ref 150–400)
Platelets: 185 10*3/uL (ref 150–400)
RBC: 4.72 MIL/uL (ref 3.87–5.11)
RBC: 4.23 MIL/uL (ref 3.87–5.11)
RDW: 14.6 % (ref 11.5–15.5)
RDW: 14.3 % (ref 11.5–15.5)
WBC: 9.3 10*3/uL (ref 4.0–10.5)
WBC: 8.2 10*3/uL (ref 4.0–10.5)
nRBC: 0 % (ref 0.0–0.2)
nRBC: 0 % (ref 0.0–0.2)

## 2022-11-09 LAB — BLOOD GAS, ARTERIAL
Acid-Base Excess: 0 mmol/L (ref 0.0–2.0)
Bicarbonate: 25.4 mmol/L (ref 20.0–28.0)
O2 Saturation: 99.7 %
Patient temperature: 37.2
pCO2 arterial: 43 mmHg (ref 32–48)
pH, Arterial: 7.38 (ref 7.35–7.45)
pO2, Arterial: 99 mmHg (ref 83–108)

## 2022-11-09 LAB — LACTIC ACID, PLASMA: Lactic Acid, Venous: 1.7 mmol/L (ref 0.5–1.9)

## 2022-11-09 LAB — TROPONIN I (HIGH SENSITIVITY)
Troponin I (High Sensitivity): 8 ng/L (ref <18)
Troponin I (High Sensitivity): 9 ng/L (ref <18)
Troponin I (High Sensitivity): 9 ng/L (ref <18)

## 2022-11-09 LAB — PROTIME-INR
INR: 1.6 — ABNORMAL HIGH (ref 0.8–1.2)
Prothrombin Time: 19.4 seconds — ABNORMAL HIGH (ref 11.4–15.2)

## 2022-11-09 LAB — VITAMIN B12: Vitamin B-12: 748 pg/mL (ref 180–914)

## 2022-11-09 LAB — GLUCOSE, CAPILLARY
Glucose-Capillary: 320 mg/dL — ABNORMAL HIGH (ref 70–99)
Glucose-Capillary: 348 mg/dL — ABNORMAL HIGH (ref 70–99)
Glucose-Capillary: 325 mg/dL — ABNORMAL HIGH (ref 70–99)

## 2022-11-09 LAB — SAMPLE TO BLOOD BANK

## 2022-11-09 LAB — ETHANOL: Alcohol, Ethyl (B): <10 mg/dL (ref <10)

## 2022-11-09 MED ORDER — PREGABALIN 25 MG PO CAPS
50.0000 mg | ORAL_CAPSULE | Freq: Every day | ORAL | Status: DC
  Administered 2022-11-10: 50 mg via ORAL
  Filled 2022-11-09: qty 2

## 2022-11-09 MED ORDER — INSULIN ASPART 100 UNIT/ML IJ SOLN
0.0000 [IU] | Freq: Three times a day (TID) | INTRAMUSCULAR | Status: DC
  Administered 2022-11-09: 11 [IU] via SUBCUTANEOUS
  Administered 2022-11-10 – 2022-11-11 (×4): 8 [IU] via SUBCUTANEOUS

## 2022-11-09 MED ORDER — NYSTATIN 100000 UNIT/GM EX OINT: 1.0000 | TOPICAL_OINTMENT | CUTANEOUS | Status: DC | PRN

## 2022-11-09 MED ORDER — MORPHINE SULFATE ER 15 MG PO TBCR
15.0000 mg | EXTENDED_RELEASE_TABLET | Freq: Two times a day (BID) | ORAL | Status: DC
  Administered 2022-11-10 – 2022-11-11 (×3): 15 mg via ORAL
  Filled 2022-11-09 (×3): qty 1

## 2022-11-09 MED ORDER — CYANOCOBALAMIN 1000 MCG/ML IJ SOLN
1000.0000 ug | Freq: Once | INTRAMUSCULAR | Status: AC
  Administered 2022-11-09: 1000 ug via INTRAMUSCULAR
  Filled 2022-11-09: qty 1

## 2022-11-09 MED ORDER — SENNOSIDES-DOCUSATE SODIUM 8.6-50 MG PO TABS: 1.0000 | ORAL_TABLET | ORAL | Status: DC | PRN

## 2022-11-09 MED ORDER — ACETAMINOPHEN 325 MG PO TABS
650.0000 mg | ORAL_TABLET | Freq: Four times a day (QID) | ORAL | Status: DC | PRN
  Administered 2022-11-10 – 2022-11-11 (×7): 650 mg via ORAL
  Filled 2022-11-09 (×8): qty 2

## 2022-11-09 MED ORDER — ONDANSETRON HCL 4 MG/2ML IJ SOLN: 4.0000 mg | Freq: Four times a day (QID) | INTRAMUSCULAR | Status: DC | PRN

## 2022-11-09 MED ORDER — INSULIN GLARGINE-YFGN 100 UNIT/ML ~~LOC~~ SOLN
20.0000 [IU] | Freq: Two times a day (BID) | SUBCUTANEOUS | Status: DC
  Administered 2022-11-09 – 2022-11-11 (×4): 20 [IU] via SUBCUTANEOUS
  Filled 2022-11-09 (×6): qty 0.2

## 2022-11-09 MED ORDER — SODIUM CHLORIDE 0.9 % IV SOLN
1.0000 g | INTRAVENOUS | Status: DC
  Administered 2022-11-10 – 2022-11-11 (×2): 1 g via INTRAVENOUS
  Filled 2022-11-09 (×3): qty 10

## 2022-11-09 MED ORDER — ONDANSETRON HCL 4 MG PO TABS: 4.0000 mg | ORAL_TABLET | Freq: Four times a day (QID) | ORAL | Status: DC | PRN

## 2022-11-09 MED ORDER — MOMETASONE FURO-FORMOTEROL FUM 100-5 MCG/ACT IN AERO
2.0000 | INHALATION_SPRAY | Freq: Two times a day (BID) | RESPIRATORY_TRACT | Status: DC
  Administered 2022-11-10 – 2022-11-11 (×2): 2 via RESPIRATORY_TRACT
  Filled 2022-11-09 (×2): qty 8.8

## 2022-11-09 MED ORDER — FENTANYL CITRATE PF 50 MCG/ML IJ SOSY
50.0000 ug | PREFILLED_SYRINGE | Freq: Once | INTRAMUSCULAR | Status: AC
  Administered 2022-11-09: 50 ug via INTRAVENOUS
  Filled 2022-11-09: qty 1

## 2022-11-09 MED ORDER — LORAZEPAM 0.5 MG PO TABS: 0.5000 mg | ORAL_TABLET | Freq: Two times a day (BID) | ORAL | Status: DC | PRN

## 2022-11-09 MED ORDER — SODIUM CHLORIDE 0.9 % IV SOLN: INTRAVENOUS | Status: DC

## 2022-11-09 MED ORDER — SODIUM CHLORIDE 0.9 % IV BOLUS
500.0000 mL | Freq: Once | INTRAVENOUS | Status: AC
  Administered 2022-11-09: 500 mL via INTRAVENOUS

## 2022-11-09 MED ORDER — ARIPIPRAZOLE 2 MG PO TABS
2.0000 mg | ORAL_TABLET | Freq: Every day | ORAL | Status: DC
  Administered 2022-11-10 (×2): 2 mg via ORAL
  Filled 2022-11-09 (×4): qty 1

## 2022-11-09 MED ORDER — OXCARBAZEPINE 150 MG PO TABS
150.0000 mg | ORAL_TABLET | Freq: Two times a day (BID) | ORAL | Status: DC
  Administered 2022-11-10 – 2022-11-11 (×3): 150 mg via ORAL
  Filled 2022-11-09 (×5): qty 1

## 2022-11-09 MED ORDER — APIXABAN 5 MG PO TABS
5.0000 mg | ORAL_TABLET | Freq: Two times a day (BID) | ORAL | Status: DC
  Administered 2022-11-10 – 2022-11-11 (×4): 5 mg via ORAL
  Filled 2022-11-09 (×4): qty 1

## 2022-11-09 MED ORDER — SODIUM CHLORIDE 0.9 % IV SOLN
1.0000 g | Freq: Once | INTRAVENOUS | Status: AC
  Administered 2022-11-09: 1 g via INTRAVENOUS
  Filled 2022-11-09: qty 10

## 2022-11-09 MED ORDER — INSULIN ASPART 100 UNIT/ML IJ SOLN
0.0000 [IU] | Freq: Every day | INTRAMUSCULAR | Status: DC
  Administered 2022-11-10: 4 [IU] via SUBCUTANEOUS
  Administered 2022-11-10: 2 [IU] via SUBCUTANEOUS

## 2022-11-09 MED ORDER — SODIUM CHLORIDE 0.9% FLUSH
3.0000 mL | Freq: Two times a day (BID) | INTRAVENOUS | Status: DC
  Administered 2022-11-09: 3 mL via INTRAVENOUS

## 2022-11-09 MED ORDER — MORPHINE SULFATE (PF) 4 MG/ML IV SOLN
4.0000 mg | Freq: Once | INTRAVENOUS | Status: AC
  Administered 2022-11-09: 4 mg via INTRAVENOUS
  Filled 2022-11-09: qty 1

## 2022-11-09 MED ORDER — ALBUTEROL SULFATE (2.5 MG/3ML) 0.083% IN NEBU: 2.5000 mg | INHALATION_SOLUTION | Freq: Four times a day (QID) | RESPIRATORY_TRACT | Status: DC | PRN

## 2022-11-09 MED ORDER — ALLOPURINOL 100 MG PO TABS
100.0000 mg | ORAL_TABLET | Freq: Two times a day (BID) | ORAL | Status: DC
  Administered 2022-11-10 – 2022-11-11 (×4): 100 mg via ORAL
  Filled 2022-11-09 (×4): qty 1

## 2022-11-09 MED ORDER — ACETAMINOPHEN 650 MG RE SUPP: 650.0000 mg | Freq: Four times a day (QID) | RECTAL | Status: DC | PRN

## 2022-11-09 NOTE — ED Notes (Signed)
Pt states her head is hurting. RN provided pt medication from [facility]

## 2022-11-09 NOTE — Progress Notes (Signed)
Orthopedic Tech Progress Note Patient Details:  Amber Stephenson [birth year] [id number]  Patient ID: [name], female   DOB: [birth year], 77 y.o.   MRN: [id number] I attended trauma page. Trinna Post 11/09/2022, 7:26 AM

## 2022-11-09 NOTE — ED Notes (Signed)
While completing orthostatics, pt was unsteady with gaining her balance in a seated position. Pt was able to stand up steadily while using a walker. Pt doesn't use a walker while at home.

## 2022-11-09 NOTE — ED Notes (Signed)
Secondary RN noticed pt O2 88% and placed on 2L nasal canula. Spouse states pt wears O2 when sleeping at home.

## 2022-11-09 NOTE — Progress Notes (Signed)
Overnight event  77 year old female with past medical history of A-fib on Eliquis, PE, CAD, type 2 diabetes, bipolar disorder, chronic back pain admitted today due to multiple falls in the setting of suspected UTI.  CT head negative for acute intracranial abnormality; showing a scalp hematoma without calvarial fracture.  Labs significant for sodium 131, glucose in the 300s without signs of DKA, creatinine 1.0, troponin negative x 2, lactic acid normal.  Patient was not hypoxic on arrival to the ED.  She desatted to 89% on room air after she was given IV morphine and placed on 2 L [place].  Orthostatic vitals checked in the ED- no drop in blood pressure but pulse increased from 87 > 112 when going from lying to standing position.  Notified by RN that patient had a large bowel movement in bed and while nursing staff were trying to assist her cleaning up, patient sat at the side of the bed and had a an episode of loss of consciousness and fell back in the bed.  Nursing staff present with the patient and she did not sustain any injuries.  It is reported that her eyes had rolled back and she fell backwards in the bed losing consciousness for about 5-10 seconds.  No seizure-like activity reported and as soon as patient regained consciousness, she was AAOx4, following commands, and did not have any focal neurologic deficits.  However, patient did complain of severe headache and dizziness.  Vital signs checked soon after syncopal event: Temperature 98.4 F, pulse 86, respiratory rate 21, blood pressure 130/98, SpO2 99% on 2 L [place].  ?Vasovagal event vs possibly orthostatic.  -Cardiac monitoring -Stat EKG and repeat troponin -Stat repeat CT head -Echocardiogram -Hold home antihypertensives/diuretic at this time -Fall precautions

## 2022-11-09 NOTE — ED Notes (Signed)
Patient transported to CT 

## 2022-11-09 NOTE — H&P (Addendum)
History and Physical    Patient: Amber Stephenson:[id number] DOB: January 14, 1946 DOA: 11/09/2022 DOS: the patient was seen and examined on 11/09/2022 PCP: Mechele Claude, MD  Patient coming from: Home via private vehicle  Chief Complaint:  Chief Complaint  Patient presents with   Fall   HPI: Amber Stephenson is a 77 y.o. female with medical history significant of atrial fibrillation on Eliquis, PE, CAD, DM type II, bipolar disorder, chronic back pain followed by pain management who presents after having 3 falls within a 24-hour period.  History is obtained from the patient with assistance of her husband is present at bedside.  The patient had been seen in the emergency department 7 days ago due to complaints of a rash on her left side.  Husband notes that she had bruising from a prior fall but had some redness and open sore which was thought to be concerning for shingles outbreak.  Patient had been seen by dermatology the next day and they were told that that was likely not the shingles, but to complete the course of Valtrex to be on the safe side.  Over the last 3 days patient states that her blood sugars have been elevated.  She had been having increased urinary frequency with reports of discomfort with urinating.  Husband reports that over the last 24 hours she had fallen once in the shower and subsequently fallen while at the foot of the bed.  With both of those falls she could not sustain any trauma to her head or lose consciousness.  However this morning around 4 AM patient had gotten up to try and use the restroom and had a fall and hitting her head for which they brought her here to the emergency department for further evaluation.  She is on Eliquis and last dose was yesterday evening.  Patient husband notes that she has a history of falling and it was previously recommended for her to use a walker.  They also have a 3 wheeler he says, but the way their house is set up there is not enough room to  maneuver it and therefore she does not use any assistive device while in the house.  Patient denied having any loss of consciousness with the fall.  Patient also made note prior to having the falls she had been in the bed for at least 3 days.    In the emergency department patient was noted to be afebrile with fairly stable vital signs.  CT scan of the head and cervical spine did not note any acute intracranial or cervical spine injury and a scalp hematoma without calvarial fracture.  Labs significant for sodium 131, glucose 368, BUN 31, creatinine 1.06, high-sensitivity troponins negative x 2.  Urinalysis noted large hemoglobin, moderate leukocytes, rare bacteria, greater than 50 RBCs/hpf, 11-20 WBCs, and greater than 50 RBCs.  Patient has been given 500 mL bolus of IV fluids, IV pain medication, and then 1 g IV.       Review of Systems: As mentioned in the history of present illness. All other systems reviewed and are negative. 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   Benign paroxysmal positional vertigo  due to bilateral vestibular disorder 05/20/2019   Bipolar 1 disorder (HCC) 01/23/2015   with GAD, benzo dependence   BPPV (benign paroxysmal positional vertigo) 05/20/2019   CAD (coronary artery disease)    Nonobstructive; Managed by Dr. Purvis Sheffield   Cardiomegaly 01/12/2018   CHF (congestive heart failure) 06/08/2022   Chronic back pain 01/04/2015   Chronic constipation 04/25/2020   Chronic diastolic heart failure (HCC) 05/30/2015   Chronic pain syndrome 08/22/2019   back pain, sacroiliitis   Chronic pain syndrome 08/22/2019   Chronic  post-traumatic stress disorder (PTSD) 12/06/2020   Diabetic neuropathy (HCC) 02/06/2016   Dyslipidemia 09/24/2020   Dyspnea on exertion    Essential hypertension    Family history of coronary arteriosclerosis 05/30/2015   Functional diarrhea 10/26/2020   Head trauma 09/17/2020   Hemorrhoids 03/11/2022   Herpes genitalis in women 07/16/2015   History of adenomatous polyp of colon 05/21/2019   Overview:   03/31/17: Colonoscopy: nonadvanced adenoma, microscopic colitis, f/u 5 yrs, [facility]/GAP   Insomnia 01/23/2015   Lipoma 02/08/2015   Migraine headache with aura 02/12/2016   Myofascial pain dysfunction syndrome 08/22/2019   Myofascial pain dysfunction syndrome 08/22/2019   Non-alcoholic fatty liver disease 01/12/2018   Opioid dependence (HCC) 12/06/2020   OSA (obstructive sleep apnea) 02/24/2019   10/09/2018 - HST  - AHI 40.6    Osteopenia 12/06/2020   Rx alendronate 35 mg.   Pinguecula 12/06/2020   Postcoital bleeding 12/06/2020   Presbyopia 12/06/2020   Pulmonary hypertension    RLS (restless legs syndrome) 04/27/2015   Superficial fungus infection of skin 09/03/2021   Tremor, essential 12/11/2021   Type II diabetes mellitus (HCC) 09/24/2020   Past Surgical History:  Procedure Laterality Date   BREAST REDUCTION SURGERY     EYE SURGERY Right    cateracts   HAMMER TOE SURGERY     LEFT HEART CATH AND CORONARY ANGIOGRAPHY N/A 02/03/2018   Procedure: LEFT HEART CATH AND CORONARY ANGIOGRAPHY;  Surgeon: [name], Peter M, MD;  Location: [facility] INVASIVE CV LAB;  Service: Cardiovascular;  Laterality: N/A;   REVERSE SHOULDER ARTHROPLASTY Right 08/19/2019   Procedure: REVERSE SHOULDER ARTHROPLASTY;  Surgeon: Beverely Low, MD;  Location: [facility] ORS;  Service: Orthopedics;  Laterality: Right;  interscalene block   SHOULDER SURGERY Right    "I BROKE MY SHOUDLER   THIGH SURGERY     "TO REMOVE A TUMOR "   Social History:  reports that she has never smoked. She has never used smokeless tobacco. She  reports that she does not currently use alcohol. She reports that she does not use drugs.  Allergies  Allergen Reactions   Iodine Anaphylaxis   Ivp Dye [Iodinated Contrast Media] Anaphylaxis and Swelling    Throat closes    Latex Other (See Comments)    Latex tape pulls skin with it   Tizanidine Other (See Comments)    Weakness, goofy, bad dreams    Family History  Problem Relation Age of Onset   Alzheimer's disease Father    Diabetes Mother    Heart disease Mother    Stroke Brother    Heart disease Brother    Mental illness Brother    Diabetes Brother    Heart disease Sister        CABG   Diabetes Sister    Alcohol abuse Sister     Prior to Admission medications   Medication Sig Start Date End Date Taking? Authorizing Provider  Acetaminophen (TYLENOL PO) Take 2 tablets by mouth  as needed (pain/headache).    [provider]  albuterol (VENTOLIN HFA) 108 (90 Base) MCG/ACT inhaler Inhale 2 puffs into the lungs every 6 (six) hours as needed for wheezing or shortness of breath. 08/09/20   Sharee Holster, NP  apixaban (ELIQUIS) 5 MG TABS tablet Take 1 tablet (5 mg total) by mouth 2 (two) times daily. 09/09/22   Quintella Reichert, MD  ARIPiprazole (ABILIFY) 2 MG tablet Take 2 mg by mouth at bedtime. 10/07/21   [provider]  Continuous Blood Gluc Sensor (DEXCOM G7 SENSOR) MISC Change sensor every 10 days 05/20/22   Shamleffer, Konrad Dolores, MD  Continuous Blood Gluc Transmit (DEXCOM G6 TRANSMITTER) MISC Use as instructed to check blood sugar. Change every 90 days 05/12/22   Shamleffer, Konrad Dolores, MD  cyanocobalamin (VITAMIN B12) 1000 MCG tablet Take 1 tablet (1,000 mcg total) by mouth daily. 09/04/22   Ghimire, Werner Lean, MD  denosumab (PROLIA) 60 MG/ML SOSY injection Inject 60 mg into the skin every 6 (six) months. 08/14/20   Mechele Claude, MD  fluticasone-salmeterol (ADVAIR DISKUS) 100-50 MCG/ACT AEPB Inhale 1 puff into the lungs 2 (two) times daily. 09/11/22    Parrett, Virgel Bouquet, NP  furosemide (LASIX) 20 MG tablet Take 1 tablet (20 mg total) by mouth every morning. 10/08/22 11/07/22  Mechele Claude, MD  Galcanezumab-gnlm [facility]) 120 MG/ML SOAJ INJECT ONCE A MONTH 10/31/22   Marcos Eke, PA-C  insulin glargine (LANTUS SOLOSTAR) 100 UNIT/ML Solostar Pen Inject 20 Units into the skin 2 (two) times daily. 09/04/22   Ghimire, Werner Lean, MD  insulin lispro (HUMALOG KWIKPEN) 100 UNIT/ML KwikPen Max daily 50 units Patient taking differently: Inject 4-8 Units into the skin 3 (three) times daily. 05/28/22   Shamleffer, Konrad Dolores, MD  Insulin Pen Needle 29G X MISC 1 Device by Does not apply route daily in the afternoon. 05/28/22   Shamleffer, Konrad Dolores, MD  lisinopril (ZESTRIL) 10 MG tablet Take 1 tablet (10 mg total) by mouth daily. 01/14/22   Mechele Claude, MD  LORazepam (ATIVAN) 1 MG tablet Take 0.5 tablets (0.5 mg total) by mouth 2 (two) times daily. 09/04/22   Ghimire, Werner Lean, MD  metoprolol tartrate (LOPRESSOR) 50 MG tablet Take 1 tablet (50 mg total) by mouth 2 (two) times daily. 09/09/22   Quintella Reichert, MD  morphine (MS CONTIN) 15 MG 12 hr tablet Take 1 tablet (15 mg total) by mouth every 12 (twelve) hours. For chronic pain 10/21/22   Lovorn, Aundra Millet, MD  naloxone [facility]) nasal spray 4 mg/0.1 mL Place 1 spray into the nose once. 08/08/21   [provider]  nystatin (MYCOSTATIN/NYSTOP) powder Apply 1 Application topically as needed (rash). 06/16/22   Shamleffer, Konrad Dolores, MD  nystatin ointment (MYCOSTATIN) Apply 1 Application topically 2 (two) times daily. Patient taking differently: Apply 1 Application topically as needed (rash). 12/27/21   Adline Potter, NP  ondansetron (ZOFRAN-ODT) 8 MG disintegrating tablet Take 1 tablet (8 mg total) by mouth every 6 (six) hours as needed for nausea or vomiting. 01/13/22   Mechele Claude, MD  OXcarbazepine (TRILEPTAL) 150 MG tablet Take 1 tablet (150 mg total) by mouth 2 (two) times daily.  06/13/22   Marguerita Merles Latif, DO  pantoprazole (PROTONIX) 40 MG tablet Take 1 tablet (40 mg total) by mouth daily at 12 noon. 09/04/22   Ghimire, Werner Lean, MD  pregabalin (LYRICA) 50 MG capsule One  qAM with a 100 mg qpm wk 1. 1 BID,  wk 2. Then 1 qpm wk 3. 10/08/22   Mechele Claude, MD  senna-docusate (SENOKOT-S) 8.6-50 MG tablet Take 1 tablet by mouth at bedtime. Patient taking differently: Take 1 tablet by mouth as needed for moderate constipation. 06/13/22   Sheikh, Omair Latif, DO  tirzepatide [facility]) 5 MG/0.5ML Pen Inject 5 mg into the skin once a week. 08/19/22   Shamleffer, Konrad Dolores, MD  valACYclovir (VALTREX) 1000 MG tablet Take 1 tablet (1,000 mg total) by mouth 3 (three) times daily. 11/02/22   Fayrene Helper, PA-C  Vitamin D, Cholecalciferol, 25 MCG (1000 UT) TABS Take 1,000 Units by mouth daily in the afternoon. 07/26/20   [provider]    Physical Exam: Vitals:   11/09/22 0800 11/09/22 0900 11/09/22 0945 11/09/22 1051  BP: 113/68 96/61 118/62   Pulse: 74 80 91   Resp: 20 17 20    Temp:    98.4 F (36.9 C)  TempSrc:    Oral  SpO2: 92% 93% 94%   Weight:      Height:         Constitutional: Elderly female currently in no acute distress Eyes: PERRL, lids and conjunctivae normal ENMT: Mucous membranes are moist  Neck: normal, supple  Respiratory: clear to auscultation bilaterally, no wheezing, no crackles. Normal respiratory effort.  Able to talk in complete sentences.  Patient currently on 2 L of nasal cannula oxygen. Cardiovascular: Regular rate and rhythm, no murmurs / rubs / gallops. No extremity edema.   Abdomen: no tenderness, no masses palpated. No hepatosplenomegaly. Bowel sounds positive.  Musculoskeletal: no clubbing / cyanosis. No joint deformity upper and lower extremities. Good ROM, no contractures. Normal muscle tone.  Skin: Poor skin turgor.  Significant hematoma noted of the top of the scalp.  Bruising noted at the site of the left side of the  abdomen with small wound as seen below.  Neurologic: CN 2-12 grossly intact.   Strength 5/5 in all 4.  Psychiatric: Normal judgment and insight. Alert and oriented x 3. Normal mood.   Data Reviewed:  EKG revealed atrial fibrillation at 90 bpm.  Reviewed labs, imaging, and pertinent records as noted above in HPI.  Assessment and Plan:  Frequent falls Scalp hematoma secondary to fall Patient presents after having a 3 falls within the 24-hour period.  CT scans of the head and cervical spine noted large hematoma without calvarial fracture.  TSH was noted to be within normal limits just 2 months ago.  Vitamin B12 levels were noted to be normal.  Patient also on scheduled Ativan and morphine which likely puts her at increased risk for falls along with likely deconditioning. -Admit to a telemetry bed -Apply cold compresses as needed to head -PT/OT to evaluate and treat -Transitions of care consulted  Spectated urinary tract infection Patient reported complaints of discomfort with urinating as well as urinary frequency.  Urinalysis noted large hemoglobin, moderate leukocytes, rare bacteria, greater than 50 RBCs/hpf, 11-20 WBCs, and greater than 50 RBCs.  Urine culture was obtained.  Patient was started on empiric antibiotics of Rocephin. -Follow-up urine culture -Continue Rocephin IV  Uncontrolled diabetes mellitus type 2 with hyperglycemia, with long-term use of insulin Diabetic neuropathy On admission glucose elevated at 323 without elevated anion gap.  Last hemoglobin A1c was 6.8 on 05/28/2022.  Suspect patient's diabetes being uncontrolled at this time. -Hypoglycemic protocols -Recheck hemoglobin A1c -Semglee 20 units twice daily -CBGs before every meal with moderate SSI -Appreciate diabetic education as patient likely needs adjustments to  Atrial fibrillation on chronic anticoagulation Patient appears relatively rate controlled. -Continue Eliquis -Consider when medically appropriate  to resume blood pressure medications including metoprolol.  Vitamin B12 deficiency Recheck patient's vitamin B12 level was noted to be 211 on 09/01/2022.  It appears she has been on vitamin B12 1000 mg daily po.   -Recheck vitamin B12 levels -Plan to give vitamin B12 injection  Bipolar disorder -Continue Abilify -Ativan made as needed instead of scheduled  Essential hypertension Blood pressures appear to be currently maintained.  Initial orthostatic vital signs however did note a significant increase in heart rate from laying to standing. -Held home blood pressure medication regimen due to concern for possible orthostatic hypotension  Chronic pain Husband denies any recent medication changes and is followed in the outpatient setting by pain management. -Continue home pain medication regimen  Question of shingles  Seems less likely based off physical exam.  Patient was also seen by Dr. Who felt symptoms were less likely. -Did not continue Valtrex   DVT prophylaxis: Eliquis Advance Care Planning:   Code Status: Full Code   Consults: None  Family Communication: Husband updated at bedside  Severity of Illness: The appropriate patient status for this patient is INPATIENT. Inpatient status is judged to be reasonable and necessary in order to provide the required intensity of service to ensure the patient's safety. The patient's presenting symptoms, physical exam findings, and initial radiographic and laboratory data in the context of their chronic comorbidities is felt to place them at high risk for further clinical deterioration. Furthermore, it is not anticipated that the patient will be medically stable for discharge from the hospital within 2 midnights of admission.   * I certify that at the point of admission it is my clinical judgment that the patient will require inpatient hospital care spanning beyond 2 midnights from the point of admission due to high intensity of service, high  risk for further deterioration and high frequency of surveillance required.*  Author: Clydie Braun, MD 11/09/2022 12:27 PM  For on call review [email or web address].

## 2022-11-09 NOTE — ED Notes (Signed)
ED TO INPATIENT HANDOFF REPORT  ED Nurse Name and Phone #: Jess Barters [phone]  S Name/Age/Gender Amber Stephenson 77 y.o. female Room/Bed: 037C/037C  Code Status   Code Status: Full Code  Home/SNF/Other Home Patient oriented to: self, place, time, and situation Is this baseline? Yes   Triage Complete: Triage complete  Chief Complaint UTI (urinary tract infection) [N39.0]  Triage Note Pt had fall this morning in the bathroom.  Pt is on blood thinners and hit her head. No lacerations. Pt is complaining of headache 8/10. No deficits at this time    Allergies Allergies  Allergen Reactions   Iodine Anaphylaxis   Ivp Dye [Iodinated Contrast Media] Anaphylaxis and Swelling    Throat closes    Latex Other (See Comments)    Latex tape pulls skin with it   Tizanidine Other (See Comments)    Weakness, goofy, bad dreams    Level of Care/Admitting Diagnosis ED Disposition     ED Disposition  Admit   Condition  --   Comment  Hospital Area: [facility] [[id number]]  Level of Care: Telemetry Medical [[id number]]  May admit patient to [facility] or [facility] if equivalent level of care is available:: No  Covid Evaluation: Asymptomatic - no recent exposure (last 10 days) testing not required  Diagnosis: UTI (urinary tract infection) [454098]  Admitting Physician: Clydie Braun [[id number]]  Attending Physician: Clydie Braun [[id number]]  Certification:: I certify this patient will need inpatient services for at least 2 midnights  Estimated Length of Stay: 2          B Medical/Surgery History Past Medical History:  Diagnosis Date   Acquired hammer toe 12/06/2020   Acute metabolic encephalopathy 06/27/2022   Allergic rhinitis 02/24/2019   Ambulatory dysfunction 04/23/2020   Atrial fibrillation with RVR (HCC) 05/19/2017   Back pain/sacroiliitis--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   Benign paroxysmal positional vertigo due to bilateral vestibular disorder 05/20/2019   Bipolar 1 disorder (HCC) 01/23/2015   with GAD, benzo dependence   BPPV (benign paroxysmal positional vertigo) 05/20/2019   CAD (coronary artery disease)    Nonobstructive; Managed by Dr. Purvis Sheffield   Cardiomegaly 01/12/2018   CHF (congestive heart failure) 06/08/2022   Chronic back pain 01/04/2015   Chronic constipation 04/25/2020   Chronic diastolic heart failure (HCC) 05/30/2015   Chronic pain syndrome 08/22/2019   back pain, sacroiliitis   Chronic pain syndrome 08/22/2019   Chronic post-traumatic stress disorder (PTSD) 12/06/2020   Diabetic neuropathy (HCC) 02/06/2016   Dyslipidemia 09/24/2020   Dyspnea on exertion    Essential hypertension    Family history of coronary arteriosclerosis 05/30/2015   Functional diarrhea 10/26/2020   Head trauma 09/17/2020   Hemorrhoids 03/11/2022   Herpes genitalis in women 07/16/2015   History of adenomatous polyp of colon 05/21/2019   Overview:   03/31/17: Colonoscopy: nonadvanced adenoma, microscopic colitis, f/u 5 yrs, [facility]/G[facility]   Insomnia 01/23/2015   Lipoma 02/08/2015   Migraine headache with aura 02/12/2016   Myofascial pain dysfunction syndrome 08/22/2019   Myofascial pain dysfunction syndrome 08/22/2019   Non-alcoholic fatty liver disease 01/12/2018   Opioid dependence (HCC) 12/06/2020   OSA (obstructive sleep apnea) 02/24/2019   10/09/2018 - HST  - AHI 40.6    Osteopenia 12/06/2020   Rx alendronate 35 mg.   Pinguecula 12/06/2020   Postcoital bleeding 12/06/2020   Presbyopia  12/06/2020   Pulmonary hypertension    RLS (restless legs syndrome) 04/27/2015   Superficial fungus infection of skin 09/03/2021   Tremor, essential 12/11/2021   Type II diabetes mellitus (HCC) 09/24/2020   Past Surgical History:  Procedure Laterality Date   BREAST REDUCTION SURGERY     EYE SURGERY Right    cateracts    HAMMER TOE SURGERY     LEFT HEART CATH AND CORONARY ANGIOGRAPHY N/A 02/03/2018   Procedure: LEFT HEART CATH AND CORONARY ANGIOGRAPHY;  Surgeon: [name], Peter M, MD;  Location: [facility] INVASIVE CV LAB;  Service: Cardiovascular;  Laterality: N/A;   REVERSE SHOULDER ARTHROPLASTY Right 08/19/2019   Procedure: REVERSE SHOULDER ARTHROPLASTY;  Surgeon: Beverely Low, MD;  Location: [facility] ORS;  Service: Orthopedics;  Laterality: Right;  interscalene block   SHOULDER SURGERY Right    "I BROKE MY SHOUDLER   THIGH SURGERY     "TO REMOVE A TUMOR "     A IV Location/Drains/Wounds Patient Lines/Drains/Airways Status     Active Line/Drains/Airways     Name Placement date Placement time Site Days   Peripheral IV 11/09/22 20 G Left;Posterior Forearm 11/09/22  0633  Forearm  less than 1            Intake/Output Last 24 hours  Intake/Output Summary (Last 24 hours) at 11/09/2022 1407 Last data filed at 11/09/2022 0947 Gross per 24 hour  Intake 500 ml  Output --  Net 500 ml    Labs/Imaging Results for orders placed or performed during the hospital encounter of 11/09/22 (from the past 48 hour(s))  Comprehensive metabolic panel     Status: Abnormal   Collection Time: 11/09/22  6:24 AM  Result Value Ref Range   Sodium 131 (L) 135 - 145 mmol/L   Potassium 4.5 3.5 - 5.1 mmol/L   Chloride 96 (L) 98 - 111 mmol/L   CO2 22 22 - 32 mmol/L   Glucose, Bld 366 (H) 70 - 99 mg/dL    Comment: Glucose reference range applies only to samples taken after fasting for at least 8 hours.   BUN 24 (H) 8 - 23 mg/dL   Creatinine, Ser 1.61 (H) 0.44 - 1.00 mg/dL   Calcium 9.5 8.9 - 09.6 mg/dL   Total Protein 6.8 6.5 - 8.1 g/dL   Albumin 3.3 (L) 3.5 - 5.0 g/dL   AST 22 15 - 41 U/L   ALT 13 0 - 44 U/L   Alkaline Phosphatase 106 38 - 126 U/L   Total Bilirubin 0.9 0.3 - 1.2 mg/dL   GFR, Estimated 55 (L) >60 mL/min    Comment: (NOTE) Calculated using the CKD-EPI Creatinine Equation (2021)    Anion gap 13 5 - 15    Comment:  Performed at [facility], [address], [place], [place] [address]  I-Stat Chem 8, ED     Status: Abnormal   Collection Time: 11/09/22  6:24 AM  Result Value Ref Range   Sodium 132 (L) 135 - 145 mmol/L   Potassium 4.4 3.5 - 5.1 mmol/L   Chloride 98 98 - 111 mmol/L   BUN 31 (H) 8 - 23 mg/dL   Creatinine, Ser 9.81 0.44 - 1.00 mg/dL   Glucose, Bld 191 (H) 70 - 99 mg/dL    Comment: Glucose reference range applies only to samples taken after fasting for at least 8 hours.   Calcium, Ion 1.17 1.15 - 1.40 mmol/L   TCO2 26 22 - 32 mmol/L   Hemoglobin 13.9  12.0 - 15.0 g/dL   HCT 69.6 29.5 - 28.4 %  CBC     Status: None   Collection Time: 11/09/22  6:24 AM  Result Value Ref Range   WBC 9.3 4.0 - 10.5 K/uL   RBC 4.72 3.87 - 5.11 MIL/uL   Hemoglobin 13.6 12.0 - 15.0 g/dL   HCT 13.2 44.0 - 10.2 %   MCV 86.4 80.0 - 100.0 fL   MCH 28.8 26.0 - 34.0 pg   MCHC 33.3 30.0 - 36.0 g/dL   RDW 72.5 36.6 - 44.0 %   Platelets 215 150 - 400 K/uL   nRBC 0.0 0.0 - 0.2 %    Comment: Performed at [facility], [address] [place], [place] [address]  Ethanol     Status: None   Collection Time: 11/09/22  6:24 AM  Result Value Ref Range   Alcohol, Ethyl (B) <10 <10 mg/dL    Comment: (NOTE) Lowest detectable limit for serum alcohol is 10 mg/dL.  For medical purposes only. Performed at [facility], [address] [place], [place] [address]   Protime-INR     Status: Abnormal   Collection Time: 11/09/22  6:24 AM  Result Value Ref Range   Prothrombin Time 19.4 (H) 11.4 - 15.2 seconds   INR 1.6 (H) 0.8 - 1.2    Comment: (NOTE) INR goal varies based on device and disease states. Performed at [facility], [address] [place], [place] [address]   Lactic acid, plasma     Status: None   Collection Time: 11/09/22  6:25 AM  Result Value Ref Range   Lactic Acid, Venous 1.7 0.5 - 1.9 mmol/L    Comment: Performed at [facility], [address], [place], [place]  [address]  Sample to Blood Bank     Status: None   Collection Time: 11/09/22  6:35 AM  Result Value Ref Range   Blood Bank Specimen SAMPLE AVAILABLE FOR TESTING    Sample Expiration      11/12/2022,2359 Performed at [facility], [address] [place], [place] [address]   Troponin I (High Sensitivity)     Status: None   Collection Time: 11/09/22  7:58 AM  Result Value Ref Range   Troponin I (High Sensitivity) 8 <18 ng/L    Comment: (NOTE) Elevated high sensitivity troponin I (hsTnI) values and significant  changes across serial measurements may suggest ACS but many other  chronic and acute conditions are known to elevate hsTnI results.  Refer to the "Links" section for chest pain algorithms and additional  guidance. Performed at [facility], [address], [place], [place] [address]   Troponin I (High Sensitivity)     Status: None   Collection Time: 11/09/22 10:08 AM  Result Value Ref Range   Troponin I (High Sensitivity) 9 <18 ng/L    Comment: (NOTE) Elevated high sensitivity troponin I (hsTnI) values and significant  changes across serial measurements may suggest ACS but many other  chronic and acute conditions are known to elevate hsTnI results.  Refer to the "Links" section for chest pain algorithms and additional  guidance. Performed at [facility], [address] [place], [place] [address]   Urinalysis, Routine w reflex microscopic -Urine, Clean Catch     Status: Abnormal   Collection Time: 11/09/22 10:15 AM  Result Value Ref Range   Color, Urine YELLOW YELLOW   APPearance HAZY (A) CLEAR  Specific Gravity, Urine 1.012 1.005 - 1.030   pH 5.0 5.0 - 8.0   Glucose, UA >=500 (A) NEGATIVE mg/dL   Hgb urine dipstick LARGE (A) NEGATIVE   Bilirubin Urine NEGATIVE NEGATIVE   Ketones, ur NEGATIVE NEGATIVE mg/dL   Protein, ur 30 (A) NEGATIVE mg/dL   Nitrite NEGATIVE NEGATIVE   Leukocytes,Ua MODERATE (A) NEGATIVE   RBC / HPF >50 0 - 5 RBC/hpf   WBC, UA  >50 0 - 5 WBC/hpf   Bacteria, UA RARE (A) NONE SEEN   Squamous Epithelial / HPF 11-20 0 - 5 /HPF   Mucus PRESENT    Hyaline Casts, UA PRESENT    Non Squamous Epithelial 0-5 (A) NONE SEEN    Comment: Performed at [facility], [address], [place], [place] [address]   *Note: Due to a large number of results and/or encounters for the requested time period, some results have not been displayed. A complete set of results can be found in Results Review.   DG Shoulder Right  Result Date: 11/09/2022 CLINICAL DATA:  Fall in bathroom this morning EXAM: RIGHT SHOULDER - 2+ VIEW COMPARISON:  08/19/2019 right shoulder radiographs FINDINGS: Status post right total shoulder arthroplasty with no evidence of hardware fracture or loosening. No glenohumeral dislocation. No osseous fracture. No evidence of acromioclavicular separation. No suspicious focal osseous lesions. No significant AC joint arthropathy. No pathologic soft tissue calcifications. IMPRESSION: No right shoulder dislocation or osseous fracture. Status post right total shoulder arthroplasty, with no evidence of hardware complication. Electronically Signed   By: Delbert Phenix M.D.   On: 11/09/2022 08:17   DG Shoulder Left  Result Date: 11/09/2022 CLINICAL DATA:  Fall in bathroom this morning EXAM: LEFT SHOULDER - 2+ VIEW COMPARISON:  None Available. FINDINGS: No left shoulder fracture. No glenohumeral dislocation. No evidence of acromioclavicular separation. No significant AC joint arthropathy. Mild inferior glenohumeral osteoarthritis. No suspicious focal osseous lesions. No radiopaque foreign bodies or pathologic soft tissue calcifications. IMPRESSION: No left shoulder fracture or malalignment. Mild inferior glenohumeral osteoarthritis. Electronically Signed   By: Delbert Phenix M.D.   On: 11/09/2022 08:15   DG Chest Port 1 View  Result Date: 11/09/2022 CLINICAL DATA:  Fall this morning in bathroom EXAM: PORTABLE CHEST 1 VIEW COMPARISON:   09/01/2022 chest radiograph. FINDINGS: Partially visualized right shoulder arthroplasty. Stable cardiomediastinal silhouette with mild cardiomegaly. No pneumothorax. No pleural effusion. No overt pulmonary edema. No acute consolidative airspace disease. No displaced fractures in the visualized chest IMPRESSION: Mild cardiomegaly without overt pulmonary edema. No active pulmonary disease. Electronically Signed   By: Delbert Phenix M.D.   On: 11/09/2022 08:14   DG Pelvis Portable  Result Date: 11/09/2022 CLINICAL DATA:  Trauma EXAM: PORTABLE PELVIS 1-2 VIEWS COMPARISON:  None Available. FINDINGS: No pelvic fracture or diastasis. Mild lower lumbar degenerative changes. No suspicious focal osseous lesions. No significant hip arthropathy. No evidence of hip dislocation on this single frontal view. IMPRESSION: No pelvic fracture. Electronically Signed   By: Delbert Phenix M.D.   On: 11/09/2022 08:12   CT Head Wo Contrast  Result Date: 11/09/2022 CLINICAL DATA:  Level 2 trauma due to fall EXAM: CT HEAD WITHOUT CONTRAST CT CERVICAL SPINE WITHOUT CONTRAST TECHNIQUE: Multidetector CT imaging of the head and cervical spine was performed following the standard protocol without intravenous contrast. Multiplanar CT image reconstructions of the cervical spine were also generated. RADIATION DOSE REDUCTION: This exam was performed according to the departmental dose-optimization program which includes automated exposure control,  adjustment of the mA and/or kV according to patient size and/or use of iterative reconstruction technique. COMPARISON:  10/09/2022 FINDINGS: CT HEAD FINDINGS Brain: No evidence of acute infarction, hemorrhage, hydrocephalus, extra-axial collection or mass lesion/mass effect. Vascular: No hyperdense vessel or unexpected calcification. Skull: Scalp hematoma at the vertex.  No acute fracture Sinuses/Orbits: No evidence of injury CT CERVICAL SPINE FINDINGS Alignment: No traumatic malalignment Skull base and  vertebrae: No acute fracture Soft tissues and spinal canal: No prevertebral fluid or swelling. No visible canal hematoma. Disc levels: Generalized disc space narrowing with endplate and facet spurring. Upper chest: Clear apical lungs IMPRESSION: 1. No evidence of acute intracranial or cervical spine injury. 2. Scalp hematoma without calvarial fracture. Electronically Signed   By: Tiburcio Pea M.D.   On: 11/09/2022 07:11   CT Cervical Spine Wo Contrast  Result Date: 11/09/2022 CLINICAL DATA:  Level 2 trauma due to fall EXAM: CT HEAD WITHOUT CONTRAST CT CERVICAL SPINE WITHOUT CONTRAST TECHNIQUE: Multidetector CT imaging of the head and cervical spine was performed following the standard protocol without intravenous contrast. Multiplanar CT image reconstructions of the cervical spine were also generated. RADIATION DOSE REDUCTION: This exam was performed according to the departmental dose-optimization program which includes automated exposure control, adjustment of the mA and/or kV according to patient size and/or use of iterative reconstruction technique. COMPARISON:  10/09/2022 FINDINGS: CT HEAD FINDINGS Brain: No evidence of acute infarction, hemorrhage, hydrocephalus, extra-axial collection or mass lesion/mass effect. Vascular: No hyperdense vessel or unexpected calcification. Skull: Scalp hematoma at the vertex.  No acute fracture Sinuses/Orbits: No evidence of injury CT CERVICAL SPINE FINDINGS Alignment: No traumatic malalignment Skull base and vertebrae: No acute fracture Soft tissues and spinal canal: No prevertebral fluid or swelling. No visible canal hematoma. Disc levels: Generalized disc space narrowing with endplate and facet spurring. Upper chest: Clear apical lungs IMPRESSION: 1. No evidence of acute intracranial or cervical spine injury. 2. Scalp hematoma without calvarial fracture. Electronically Signed   By: Tiburcio Pea M.D.   On: 11/09/2022 07:11    Pending Labs Unresulted Labs (From  admission, onward)     Start     Ordered   11/10/22 0500  CBC  Tomorrow morning,   R        11/09/22 1253   11/10/22 0500  Basic metabolic panel  Tomorrow morning,   R        11/09/22 1253   11/09/22 1254  Vitamin B12  Add-on,   AD        11/09/22 1253   11/09/22 1129  Urine Culture  Once,   URGENT       Question:  Indication  Answer:  Altered mental status (if no other cause identified)   11/09/22 1128            Vitals/Pain Today's Vitals   11/09/22 0945 11/09/22 1051 11/09/22 1051 11/09/22 1205  BP: 118/62   121/65  Pulse: 91   83  Resp: 20   18  Temp:   98.4 F (36.9 C)   TempSrc:   Oral   SpO2: 94%   92%  Weight:      Height:      PainSc:  8       Isolation Precautions No active isolations  Medications Medications  sodium chloride flush (NS) 0.9 % injection 3 mL (has no administration in time range)  acetaminophen (TYLENOL) tablet 650 mg (has no administration in time range)    Or  acetaminophen (TYLENOL) suppository  650 mg (has no administration in time range)  albuterol (PROVENTIL) (2.5 MG/3ML) 0.083% nebulizer solution 2.5 mg (has no administration in time range)  ondansetron (ZOFRAN) tablet 4 mg (has no administration in time range)    Or  ondansetron (ZOFRAN) injection 4 mg (has no administration in time range)  insulin aspart (novoLOG) injection 0-15 Units (has no administration in time range)  insulin aspart (novoLOG) injection 0-5 Units (has no administration in time range)  cefTRIAXone (ROCEPHIN) 1 g in sodium chloride 0.9 % 100 mL IVPB (has no administration in time range)  0.9 %  sodium chloride infusion (has no administration in time range)  fentaNYL (SUBLIMAZE) injection 50 mcg (50 mcg Intravenous Given 11/09/22 0752)  sodium chloride 0.9 % bolus 500 mL (0 mLs Intravenous Stopped 11/09/22 0947)  morphine (PF) 4 MG/ML injection 4 mg (4 mg Intravenous Given 11/09/22 0949)  cefTRIAXone (ROCEPHIN) 1 g in sodium chloride 0.9 % 100 mL IVPB (1 g Intravenous  New Bag/Given 11/09/22 1234)    Mobility walks     Focused Assessments    R Recommendations: See Admitting Provider Note  Report given to:   Additional Notes: Pt was able to ambulate in the hallway with PT.  Pt notated she has been dealing with double vision. She is able to identify objects, numbers, letters but she just sees double. Admitting MD is aware. I can add you to chat just message me your info.

## 2022-11-09 NOTE — ED Notes (Signed)
Patient leaving in stable condition, AOX4, with her husband, and staff.

## 2022-11-09 NOTE — Evaluation (Signed)
Physical Therapy Evaluation Patient Details Name: Amber Stephenson MRN: [id number] DOB: Sep 06, 1945 Today's Date: 11/09/2022  History of Present Illness  The pt is a 77 yo female presenting 6/9 after a fall. No acute injury or findings on imaging. PMH includes: increased falls recently, GAD, HTN, HLD BPPV, CAD nonobstructive, CHF, chronic pain, PTSD diabetes mellitus with neuropathy, nonalcoholic fatty liver disease OSA using nocturnal oxygen/pulmonary hypertension, restless leg syndrome, and tremor.   Clinical Impression  Pt in bed upon arrival of PT, agreeable to evaluation at this time. Prior to admission the pt was ambulating in her home without use of DME (spouse does report intermittent furniture walking), but pt had recent increase in frequency of falls. The pt currently needs modA for bed mobility, sit-stand transfers, and short-distance hallway ambulation. Her safety with mobility is further impacted by poor awareness and initiation, causing her to run into multiple objects while walking and she made no attempt to correct despite cues. She is at increased risk of falls currently, is not yet safe to return home with support from her spouse.      Recommendations for follow up therapy are one component of a multi-disciplinary discharge planning process, led by the attending physician.  Recommendations may be updated based on patient status, additional functional criteria and insurance authorization.  Follow Up Recommendations Can patient physically be transported by private vehicle: Yes     Assistance Recommended at Discharge Frequent or constant Supervision/Assistance  Patient can return home with the following  A lot of help with walking and/or transfers;A lot of help with bathing/dressing/bathroom;Assistance with cooking/housework;Assist for transportation;Help with stairs or ramp for entrance    Equipment Recommendations Rolling walker (2 wheels)  Recommendations for Other Services        Functional Status Assessment Patient has had a recent decline in their functional status and demonstrates the ability to make significant improvements in function in a reasonable and predictable amount of time.     Precautions / Restrictions Precautions Precautions: Fall Restrictions Weight Bearing Restrictions: No      Mobility  Bed Mobility Overal bed mobility: Needs Assistance Bed Mobility: Supine to Sit, Sit to Supine     Supine to sit: Mod assist Sit to supine: Min assist   General bed mobility comments: modA to elevate trunk, minA to reutrn LE to bed. poor sequencing    Transfers Overall transfer level: Needs assistance Equipment used: Rolling walker (2 wheels) Transfers: Sit to/from Stand Sit to Stand: Mod assist, Min assist           General transfer comment: modA from chair, minA from elevated bed (ED stretcher). dependent on UE support and cues for technique    Ambulation/Gait Ambulation/Gait assistance: Min assist, Mod assist Gait Distance (Feet): 45 Feet Assistive device: Rolling walker (2 wheels) Gait Pattern/deviations: Step-to pattern, Decreased stride length, Decreased dorsiflexion - right, Decreased dorsiflexion - left, Staggering right, Drifts right/left, Narrow base of support Gait velocity: decreased     General Gait Details: pt running into multiple objects, not steering RW or visually scanning room when directed to turn. pt needing modA to steady and to steer/manage RW. no attempt to correct when running into objects    Balance Overall balance assessment: Needs assistance Sitting-balance support: Bilateral upper extremity supported, Feet supported Sitting balance-Leahy Scale: Poor   Postural control: Posterior lean Standing balance support: Bilateral upper extremity supported, During functional activity Standing balance-Leahy Scale: Poor  Pertinent Vitals/Pain Pain Assessment Pain Assessment:  No/denies pain    Home Living Family/patient expects to be discharged to:: Private residence Living Arrangements: Spouse/significant other Available Help at Discharge: Family;Available 24 hours/day Type of Home: House Home Access: Stairs to enter Entrance Stairs-Rails: Left Entrance Stairs-Number of Steps: 5 vs 3   Home Layout: One level Home Equipment: Agricultural consultant (2 wheels);Cane - single point;Grab bars - tub/shower;Grab bars - toilet Additional Comments: spouse present and confirmed    Prior Function Prior Level of Function : Needs assist             Mobility Comments: typically pt able to ambulate without assist, increased frequency in last 2 days (4) but before that had intermitent falls about 1x/month ADLs Comments: pt spouse supervising, not physically assisting     Hand Dominance   Dominant Hand: Right    Extremity/Trunk Assessment   Upper Extremity Assessment Upper Extremity Assessment: Generalized weakness;Defer to OT evaluation    Lower Extremity Assessment Lower Extremity Assessment: Generalized weakness (no focal deficits but pt with very limited strength but can move against gravity, dependent on UE support and modA to rise to standing)    Cervical / Trunk Assessment Cervical / Trunk Assessment: Kyphotic  Communication   Communication: No difficulties  Cognition Arousal/Alertness: Awake/alert Behavior During Therapy: Flat affect Overall Cognitive Status: Impaired/Different from baseline Area of Impairment: Attention, Memory, Following commands, Safety/judgement, Awareness, Problem solving                   Current Attention Level: Focused Memory: Decreased short-term memory Following Commands: Follows one step commands inconsistently, Follows one step commands with increased time Safety/Judgement: Decreased awareness of safety, Decreased awareness of deficits Awareness: Intellectual Problem Solving: Slow processing, Decreased initiation,  Difficulty sequencing, Requires verbal cues General Comments: pt initially lethargic, improved alertness with stimulation. pt with flat affect and poor attention. minimal response to objects in environment, running into multiple objects and no attempts to correct even with cueing.        General Comments General comments (skin integrity, edema, etc.): BP stable with all changes in position.    Exercises     Assessment/Plan    PT Assessment Patient needs continued PT services  PT Problem List Decreased strength;Decreased range of motion;Decreased activity tolerance;Decreased balance;Decreased coordination;Decreased safety awareness       PT Treatment Interventions DME instruction;Gait training;Stair training;Functional mobility training;Therapeutic exercise;Therapeutic activities;Balance training;Neuromuscular re-education    PT Goals (Current goals can be found in the Care Plan section)  Acute Rehab PT Goals Patient Stated Goal: retrun home PT Goal Formulation: With patient Time For Goal Achievement: 11/23/22 Potential to Achieve Goals: Good    Frequency Min 2X/week        AM-PAC PT "6 Clicks" Mobility  Outcome Measure Help needed turning from your back to your side while in a flat bed without using bedrails?: A Lot Help needed moving from lying on your back to sitting on the side of a flat bed without using bedrails?: A Lot Help needed moving to and from a bed to a chair (including a wheelchair)?: A Lot Help needed standing up from a chair using your arms (e.g., wheelchair or bedside chair)?: A Lot Help needed to walk in hospital room?: A Lot Help needed climbing 3-5 steps with a railing? : A Lot 6 Click Score: 12    End of Session Equipment Utilized During Treatment: Gait belt Activity Tolerance: Patient tolerated treatment well Patient left: in bed;with call bell/phone within  reach;with family/visitor present Nurse Communication: Mobility status PT Visit Diagnosis:  Unsteadiness on feet (R26.81);Other abnormalities of gait and mobility (R26.89);Repeated falls (R29.6);Muscle weakness (generalized) (M62.81)    Time: 1610-9604 PT Time Calculation (min) (ACUTE ONLY): 33 min   Charges:   PT Evaluation $PT Eval Low Complexity: 1 Low PT Treatments $Gait Training: 8-22 mins        Vickki Muff, PT, DPT   Acute Rehabilitation Department Office [phone] Secure Chat Communication Preferred  Ronnie Derby 11/09/2022, 3:08 PM

## 2022-11-09 NOTE — Progress Notes (Signed)
Pt received to 14 from ED.  Oriented to room and call bell.  Husband at bs.  See admit data base, assessment, vs's.  Afib/aflutter.  Pt assisted up to go to bathroom but had near syncopal episode so assisted to bed.  Purewick placed and working.  Pt stated she has had a lot of falls lately and feels lightheaded/dizzy frequently when up.  Bed alarm on and pt instructed not to get oob and to use call bell for any assist.  Pt verbalized understand.

## 2022-11-09 NOTE — ED Triage Notes (Signed)
Pt had fall this morning in the bathroom.  Pt is on blood thinners and hit her head. No lacerations. Pt is complaining of headache 8/10. No deficits at this time

## 2022-11-09 NOTE — ED Notes (Signed)
RN assisted pt to [facility]. Pt spouse stated she has complained of burning sensation while peeing and some smear of blood when wiping. Pt has hx of UTI per spouse

## 2022-11-09 NOTE — Plan of Care (Signed)

## 2022-11-09 NOTE — Significant Event (Addendum)
Rapid Response Event Note   Reason for Call :  Notified at 2051 that pt had a syncopal event while being cleaned up in bed. Per RN, pt's eyes rolled back in her head, she then passed out, and fell back in bed. This event was witnessed and pt did not injure anything.  Pt awoke quickly and was alert and oriented x 4 per RN. Pt then began to c/o 10/10 HA.  Called back at 2149 d/t orders for CT scan. RRT came and assisted with transport to CT.   Initial Focused Assessment:  Pt lying in bed with eyes open, in no visible distress. She will awaken to voice, answer questions, and follow commands. She goes back to sleep easily when not stimulated. NIH-1 for LOC. Pupils 4, equal, and reactive. Lungs clear. ABD large, soft. Skin hot to touch.   T-98.4(R), HR-90, BP-107/60, RR-22, SpO2-97% on [place] 2L.   Interventions:  EKG-Aflutter with variable AV block, T wave abnormality CT head STAT-1. Stable head CT.  No acute intracranial abnormality. 2. Evolving soft tissue hematoma at the scalp vertex. CBG-325 ABG-7.38/43/99/25.4 BMP CBC Hold Morphine, Trileptal, Lyrica, and Ativan Tx to PCU  Plan of Care:  Await lab results. Will transfer to PCU(4E09) for closer monitoring. Please call RRT if further assistance needed.   Event Summary:   MD Notified: Dr. Loney Loh notified PTA RRT Call [phone] Arrival [phone] End Time:2200  Terrilyn Saver, RN

## 2022-11-09 NOTE — ED Provider Notes (Signed)
[place] [facility] AT [facility] Provider Note   CSN: [id number] Arrival date & time: 11/09/22  0605     History DM, CAD, Afib on eliquis  Chief Complaint  Patient presents with   Chetara Thaden is a 77 y.o. female.  77 y.o female with a PMH PE, CAD, DM, Afib on Eliquis presents to the ED s/p fall. Patient was ambulating to the bathroom when she felt dizzy and proceeded to fall on the ground, she did not lose consciousness. She does have a large goose egg to the crown of her head. Patient is on a pain regimen of morphine Sulf Er 15 Mg Tablet for ongoing back pain.This is patients 3rd fall in the past three days, she reports she's been feeling dizzy. She is endorsing pain to the left shoulder exacerbated with any type of rotational head movement.  She is placed in a c-collar by EMS. Worsening pain to the back from her baseline. No chest pain, no shortness of breath or other complaints.   The history is provided by the patient.  Fall This is a new problem. The current episode started 1 to 2 hours ago. Pertinent negatives include no chest pain, no abdominal pain, no headaches and no shortness of breath.       Home Medications Prior to Admission medications   Medication Sig Start Date End Date Taking? Authorizing Provider  Acetaminophen (TYLENOL PO) Take 2 tablets by mouth as needed (pain/headache).    [provider]  albuterol (VENTOLIN HFA) 108 (90 Base) MCG/ACT inhaler Inhale 2 puffs into the lungs every 6 (six) hours as needed for wheezing or shortness of breath. 08/09/20   Sharee Holster, NP  apixaban (ELIQUIS) 5 MG TABS tablet Take 1 tablet (5 mg total) by mouth 2 (two) times daily. 09/09/22   Quintella Reichert, MD  ARIPiprazole (ABILIFY) 2 MG tablet Take 2 mg by mouth at bedtime. 10/07/21   [provider]  Continuous Blood Gluc Sensor (DEXCOM G7 SENSOR) MISC Change sensor every 10 days 05/20/22   Shamleffer, Konrad Dolores, MD   Continuous Blood Gluc Transmit (DEXCOM G6 TRANSMITTER) MISC Use as instructed to check blood sugar. Change every 90 days 05/12/22   Shamleffer, Konrad Dolores, MD  cyanocobalamin (VITAMIN B12) 1000 MCG tablet Take 1 tablet (1,000 mcg total) by mouth daily. 09/04/22   Ghimire, Werner Lean, MD  denosumab (PROLIA) 60 MG/ML SOSY injection Inject 60 mg into the skin every 6 (six) months. 08/14/20   Mechele Claude, MD  fluticasone-salmeterol (ADVAIR DISKUS) 100-50 MCG/ACT AEPB Inhale 1 puff into the lungs 2 (two) times daily. 09/11/22   Parrett, Virgel Bouquet, NP  furosemide (LASIX) 20 MG tablet Take 1 tablet (20 mg total) by mouth every morning. 10/08/22 11/07/22  Mechele Claude, MD  Galcanezumab-gnlm [facility]) 120 MG/ML SOAJ INJECT ONCE A MONTH 10/31/22   Marcos Eke, PA-C  insulin glargine (LANTUS SOLOSTAR) 100 UNIT/ML Solostar Pen Inject 20 Units into the skin 2 (two) times daily. 09/04/22   Ghimire, Werner Lean, MD  insulin lispro (HUMALOG KWIKPEN) 100 UNIT/ML KwikPen Max daily 50 units Patient taking differently: Inject 4-8 Units into the skin 3 (three) times daily. 05/28/22   Shamleffer, Konrad Dolores, MD  Insulin Pen Needle 29G X MISC 1 Device by Does not apply route daily in the afternoon. 05/28/22   Shamleffer, Konrad Dolores, MD  lisinopril (ZESTRIL) 10 MG tablet Take 1 tablet (10 mg total) by mouth daily. 01/14/22  Mechele Claude, MD  LORazepam (ATIVAN) 1 MG tablet Take 0.5 tablets (0.5 mg total) by mouth 2 (two) times daily. 09/04/22   Ghimire, Werner Lean, MD  metoprolol tartrate (LOPRESSOR) 50 MG tablet Take 1 tablet (50 mg total) by mouth 2 (two) times daily. 09/09/22   Quintella Reichert, MD  morphine (MS CONTIN) 15 MG 12 hr tablet Take 1 tablet (15 mg total) by mouth every 12 (twelve) hours. For chronic pain 10/21/22   Lovorn, Aundra Millet, MD  naloxone [facility]) nasal spray 4 mg/0.1 mL Place 1 spray into the nose once. 08/08/21   [provider]  nystatin (MYCOSTATIN/NYSTOP) powder Apply 1 Application  topically as needed (rash). 06/16/22   Shamleffer, Konrad Dolores, MD  nystatin ointment (MYCOSTATIN) Apply 1 Application topically 2 (two) times daily. Patient taking differently: Apply 1 Application topically as needed (rash). 12/27/21   Adline Potter, NP  ondansetron (ZOFRAN-ODT) 8 MG disintegrating tablet Take 1 tablet (8 mg total) by mouth every 6 (six) hours as needed for nausea or vomiting. 01/13/22   Mechele Claude, MD  OXcarbazepine (TRILEPTAL) 150 MG tablet Take 1 tablet (150 mg total) by mouth 2 (two) times daily. 06/13/22   Marguerita Merles Latif, DO  pantoprazole (PROTONIX) 40 MG tablet Take 1 tablet (40 mg total) by mouth daily at 12 noon. 09/04/22   Ghimire, Werner Lean, MD  pregabalin (LYRICA) 50 MG capsule One  qAM with a 100 mg qpm wk 1. 1 BID, wk 2. Then 1 qpm wk 3. 10/08/22   Mechele Claude, MD  senna-docusate (SENOKOT-S) 8.6-50 MG tablet Take 1 tablet by mouth at bedtime. Patient taking differently: Take 1 tablet by mouth as needed for moderate constipation. 06/13/22   Sheikh, Omair Latif, DO  tirzepatide [facility]) 5 MG/0.5ML Pen Inject 5 mg into the skin once a week. 08/19/22   Shamleffer, Konrad Dolores, MD  valACYclovir (VALTREX) 1000 MG tablet Take 1 tablet (1,000 mg total) by mouth 3 (three) times daily. 11/02/22   Fayrene Helper, PA-C  Vitamin D, Cholecalciferol, 25 MCG (1000 UT) TABS Take 1,000 Units by mouth daily in the afternoon. 07/26/20   [provider]      Allergies    Iodine, Ivp dye [iodinated contrast media], Latex, and Tizanidine    Review of Systems   Review of Systems  Constitutional:  Negative for chills and fever.  Respiratory:  Negative for shortness of breath.   Cardiovascular:  Negative for chest pain.  Gastrointestinal:  Negative for abdominal pain, nausea and vomiting.  Genitourinary:  Negative for flank pain.  Musculoskeletal:  Positive for back pain and myalgias.  Neurological:  Negative for weakness, light-headedness and headaches.  All other  systems reviewed and are negative.   Physical Exam Updated Vital Signs BP 118/62   Pulse 91   Temp 98.4 F (36.9 C) (Oral)   Resp 20   Ht 5\' 2"  (1.575 m)   Wt 86.2 kg   SpO2 94%   BMI 34.76 kg/m  Physical Exam Vitals and nursing note reviewed.  Constitutional:      Comments: Chronically ill appearing.   HENT:     Head: Normocephalic.     Comments: Large goose egg to the crown of her head.     Mouth/Throat:     Mouth: Mucous membranes are moist.  Cardiovascular:     Rate and Rhythm: Normal rate.  Abdominal:     General: Abdomen is flat.     Palpations: Abdomen is soft.  Tenderness: There is no abdominal tenderness.     Comments: No signs of bruising or tenderness.   Musculoskeletal:     Left shoulder: Tenderness present.     Cervical back: Normal range of motion and neck supple.     Comments: Pain with palpation of the left shoulder exacerbated with movement.   Skin:    General: Skin is warm and dry.  Neurological:     Mental Status: She is alert and oriented to person, place, and time.     ED Results / Procedures / Treatments   Labs (all labs ordered are listed, but only abnormal results are displayed) Labs Reviewed  COMPREHENSIVE METABOLIC PANEL - Abnormal; Notable for the following components:      Result Value   Sodium 131 (*)    Chloride 96 (*)    Glucose, Bld 366 (*)    BUN 24 (*)    Creatinine, Ser 1.06 (*)    Albumin 3.3 (*)    GFR, Estimated 55 (*)    All other components within normal limits  URINALYSIS, ROUTINE W REFLEX MICROSCOPIC - Abnormal; Notable for the following components:   APPearance HAZY (*)    Glucose, UA >=500 (*)    Hgb urine dipstick LARGE (*)    Protein, ur 30 (*)    Leukocytes,Ua MODERATE (*)    Bacteria, UA RARE (*)    Non Squamous Epithelial 0-5 (*)    All other components within normal limits  PROTIME-INR - Abnormal; Notable for the following components:   Prothrombin Time 19.4 (*)    INR 1.6 (*)    All other  components within normal limits  I-STAT CHEM 8, ED - Abnormal; Notable for the following components:   Sodium 132 (*)    BUN 31 (*)    Glucose, Bld 368 (*)    All other components within normal limits  URINE CULTURE  CBC  ETHANOL  LACTIC ACID, PLASMA  SAMPLE TO BLOOD BANK  TROPONIN I (HIGH SENSITIVITY)  TROPONIN I (HIGH SENSITIVITY)    EKG EKG Interpretation  Date/Time:  Sunday November 09 2022 06:55:38 EDT Ventricular Rate:  90 PR Interval:    QRS Duration: 109 QT Interval:  388 QTC Calculation: 465 R Axis:   -9 Text Interpretation: Atrial fibrillation Paired ventricular premature complexes Abnormal R-wave progression, early transition Nonspecific T abnormalities, lateral leads No significant change was found Confirmed by Glynn Octave [phone]) on 11/09/2022 6:58:18 AM  Radiology DG Shoulder Right  Result Date: 11/09/2022 CLINICAL DATA:  Fall in bathroom this morning EXAM: RIGHT SHOULDER - 2+ VIEW COMPARISON:  08/19/2019 right shoulder radiographs FINDINGS: Status post right total shoulder arthroplasty with no evidence of hardware fracture or loosening. No glenohumeral dislocation. No osseous fracture. No evidence of acromioclavicular separation. No suspicious focal osseous lesions. No significant AC joint arthropathy. No pathologic soft tissue calcifications. IMPRESSION: No right shoulder dislocation or osseous fracture. Status post right total shoulder arthroplasty, with no evidence of hardware complication. Electronically Signed   By: Delbert Phenix M.D.   On: 11/09/2022 08:17   DG Shoulder Left  Result Date: 11/09/2022 CLINICAL DATA:  Fall in bathroom this morning EXAM: LEFT SHOULDER - 2+ VIEW COMPARISON:  None Available. FINDINGS: No left shoulder fracture. No glenohumeral dislocation. No evidence of acromioclavicular separation. No significant AC joint arthropathy. Mild inferior glenohumeral osteoarthritis. No suspicious focal osseous lesions. No radiopaque foreign bodies or  pathologic soft tissue calcifications. IMPRESSION: No left shoulder fracture or malalignment. Mild inferior glenohumeral osteoarthritis. Electronically  Signed   By: Delbert Phenix M.D.   On: 11/09/2022 08:15   DG Chest Port 1 View  Result Date: 11/09/2022 CLINICAL DATA:  Fall this morning in bathroom EXAM: PORTABLE CHEST 1 VIEW COMPARISON:  09/01/2022 chest radiograph. FINDINGS: Partially visualized right shoulder arthroplasty. Stable cardiomediastinal silhouette with mild cardiomegaly. No pneumothorax. No pleural effusion. No overt pulmonary edema. No acute consolidative airspace disease. No displaced fractures in the visualized chest IMPRESSION: Mild cardiomegaly without overt pulmonary edema. No active pulmonary disease. Electronically Signed   By: Delbert Phenix M.D.   On: 11/09/2022 08:14   DG Pelvis Portable  Result Date: 11/09/2022 CLINICAL DATA:  Trauma EXAM: PORTABLE PELVIS 1-2 VIEWS COMPARISON:  None Available. FINDINGS: No pelvic fracture or diastasis. Mild lower lumbar degenerative changes. No suspicious focal osseous lesions. No significant hip arthropathy. No evidence of hip dislocation on this single frontal view. IMPRESSION: No pelvic fracture. Electronically Signed   By: Delbert Phenix M.D.   On: 11/09/2022 08:12   CT Head Wo Contrast  Result Date: 11/09/2022 CLINICAL DATA:  Level 2 trauma due to fall EXAM: CT HEAD WITHOUT CONTRAST CT CERVICAL SPINE WITHOUT CONTRAST TECHNIQUE: Multidetector CT imaging of the head and cervical spine was performed following the standard protocol without intravenous contrast. Multiplanar CT image reconstructions of the cervical spine were also generated. RADIATION DOSE REDUCTION: This exam was performed according to the departmental dose-optimization program which includes automated exposure control, adjustment of the mA and/or kV according to patient size and/or use of iterative reconstruction technique. COMPARISON:  10/09/2022 FINDINGS: CT HEAD FINDINGS Brain: No  evidence of acute infarction, hemorrhage, hydrocephalus, extra-axial collection or mass lesion/mass effect. Vascular: No hyperdense vessel or unexpected calcification. Skull: Scalp hematoma at the vertex.  No acute fracture Sinuses/Orbits: No evidence of injury CT CERVICAL SPINE FINDINGS Alignment: No traumatic malalignment Skull base and vertebrae: No acute fracture Soft tissues and spinal canal: No prevertebral fluid or swelling. No visible canal hematoma. Disc levels: Generalized disc space narrowing with endplate and facet spurring. Upper chest: Clear apical lungs IMPRESSION: 1. No evidence of acute intracranial or cervical spine injury. 2. Scalp hematoma without calvarial fracture. Electronically Signed   By: Tiburcio Pea M.D.   On: 11/09/2022 07:11   CT Cervical Spine Wo Contrast  Result Date: 11/09/2022 CLINICAL DATA:  Level 2 trauma due to fall EXAM: CT HEAD WITHOUT CONTRAST CT CERVICAL SPINE WITHOUT CONTRAST TECHNIQUE: Multidetector CT imaging of the head and cervical spine was performed following the standard protocol without intravenous contrast. Multiplanar CT image reconstructions of the cervical spine were also generated. RADIATION DOSE REDUCTION: This exam was performed according to the departmental dose-optimization program which includes automated exposure control, adjustment of the mA and/or kV according to patient size and/or use of iterative reconstruction technique. COMPARISON:  10/09/2022 FINDINGS: CT HEAD FINDINGS Brain: No evidence of acute infarction, hemorrhage, hydrocephalus, extra-axial collection or mass lesion/mass effect. Vascular: No hyperdense vessel or unexpected calcification. Skull: Scalp hematoma at the vertex.  No acute fracture Sinuses/Orbits: No evidence of injury CT CERVICAL SPINE FINDINGS Alignment: No traumatic malalignment Skull base and vertebrae: No acute fracture Soft tissues and spinal canal: No prevertebral fluid or swelling. No visible canal hematoma. Disc  levels: Generalized disc space narrowing with endplate and facet spurring. Upper chest: Clear apical lungs IMPRESSION: 1. No evidence of acute intracranial or cervical spine injury. 2. Scalp hematoma without calvarial fracture. Electronically Signed   By: Tiburcio Pea M.D.   On: 11/09/2022 07:11  Procedures Procedures    Medications Ordered in ED Medications  cefTRIAXone (ROCEPHIN) 1 g in sodium chloride 0.9 % 100 mL IVPB (1 g Intravenous New Bag/Given 11/09/22 1234)  fentaNYL (SUBLIMAZE) injection 50 mcg (50 mcg Intravenous Given 11/09/22 0752)  sodium chloride 0.9 % bolus 500 mL (0 mLs Intravenous Stopped 11/09/22 0947)  morphine (PF) 4 MG/ML injection 4 mg (4 mg Intravenous Given 11/09/22 0949)    ED Course/ Medical Decision Making/ A&P Clinical Course as of 11/09/22 1236  Sun Nov 09, 2022  0900 DG Pelvis Portable [JS]  1135 Glori Luis): MODERATE [JS]  1135 Bacteria, UA(!): RARE [JS]  1135 Hgb urine dipstick(!): LARGE [JS]    Clinical Course User Index [JS] Claude Manges, PA-C                             Medical Decision Making Amount and/or Complexity of Data Reviewed Labs: ordered. Decision-making details documented in ED Course. Radiology: ordered. Decision-making details documented in ED Course. ECG/medicine tests: ordered.  Risk Prescription drug management.    This patient presents to the ED for concern of fall, this involves a number of treatment options, and is a complaint that carries with it a high risk of complications and morbidity.  The differential diagnosis includes dizziness, syncope versus mechanical fall.    Co morbidities: Discussed in HPI   Brief History:  See HPI.   EMR reviewed including pt PMHx, past surgical history and past visits to ER.   See HPI for more details   Lab Tests:  I ordered and independently interpreted labs.  The pertinent results include:    I personally reviewed all laboratory work and imaging. Metabolic panel  without any acute abnormality specifically kidney function within normal limits and no significant electrolyte abnormalities. CBC without leukocytosis or significant anemia. Lactic acid is negative. Ethanol is normal.    Imaging Studies:  NAD. I personally reviewed all imaging studies and no acute abnormality found. I agree with radiology interpretation.  Cardiac Monitoring:  The patient was maintained on a cardiac monitor.  I personally viewed and interpreted the cardiac monitored which showed an underlying rhythm of: irregularly irregular EKG non-ischemic   Medicines ordered:  I ordered medication including morphine, bolus  for symptomatic treatment Reevaluation of the patient after these medicines showed that the patient improved I have reviewed the patients home medicines and have made adjustments as needed Reevaluation:  After the interventions noted above I re-evaluated patient and found that they have :stayed the same   Social Determinants of Health:  The patient's social determinants of health were a factor in the care of this patient  Problem List / ED Course:  Patient presents to the ED with a chief complaint of fall for the past 3 days, patient's third fall occurred this morning, patient has a large goose egg to the crown of her head, placed in a c-collar by hospital staff.  On primary evaluation she is alert and oriented x 4.  Vitals are stable, she does endorse some pain along her back, has ongoing history of back pain, exacerbated with this fall.  Imaging ordered by my attending Dr. Consuella Lose, CT head, CT cervical spine, pelvis x-ray, chest x-ray, right and left shoulder x-ray without any acute pathology. Patient does report changes in her gait over the last couple of days.  Interpretation of her blood work was unremarkable CBC was unremarkable.  BMP with slight increase in her creatinine, LFTs  are within normal limits.  UA with some large hemoglobin, rare bacteria and  moderate leukocytes.  Patient got orthostatic vital signs mildly orthostatic on evaluation, given 500 bolus, after noticing pressures were tending to the softer side with a systolic in the 90s and diastolic in the 50s.  She is on a home pain regimen of morphine per PDMP review, she was given morphine here, however she desatted to 89%, therefore placed on 2 L nasal cannula. Urine concerning for urinary tract infection, in the setting of patient's worsening gait, will treat with IV Rocephin at this time, PT consultation has been ordered.  Spoke to hospitalist Dr. Egbert Garibaldi who is agreeable of admitting patient at this time.  Family at the bedside has been notified, she remains hemodynamically stable for admission.  Dispostion:  After consideration of the diagnostic results and the patients response to treatment, I feel that the patent would benefit from admission via hospitalist for further tx of UTI along with evaluation of changes in gait.    Portions of this note were generated with Scientist, clinical (histocompatibility and immunogenetics). Dictation errors may occur despite best attempts at proofreading.   Final Clinical Impression(s) / ED Diagnoses Final diagnoses:  Acute cystitis with hematuria  Fall, initial encounter    Rx / DC Orders ED Discharge Orders     None         Claude Manges, Cordelia Poche 11/09/22 1236    Mardene Sayer, MD 11/10/22 1901

## 2022-11-10 ENCOUNTER — Telehealth: Payer: Self-pay | Admitting: Adult Health

## 2022-11-10 ENCOUNTER — Inpatient Hospital Stay (HOSPITAL_COMMUNITY): Payer: Medicare Other

## 2022-11-10 ENCOUNTER — Ambulatory Visit: Payer: Medicare Other | Admitting: Family Medicine

## 2022-11-10 DIAGNOSIS — R55 Syncope and collapse: Secondary | ICD-10-CM

## 2022-11-10 DIAGNOSIS — R296 Repeated falls: Secondary | ICD-10-CM | POA: Diagnosis not present

## 2022-11-10 LAB — CBC
HCT: 39.3 % (ref 36.0–46.0)
Hemoglobin: 12.9 g/dL (ref 12.0–15.0)
MCH: 29.5 pg (ref 26.0–34.0)
MCHC: 32.8 g/dL (ref 30.0–36.0)
MCV: 89.7 fL (ref 80.0–100.0)
Platelets: 185 10*3/uL (ref 150–400)
RBC: 4.38 MIL/uL (ref 3.87–5.11)
RDW: 14.4 % (ref 11.5–15.5)
WBC: 8.4 10*3/uL (ref 4.0–10.5)
nRBC: 0 % (ref 0.0–0.2)

## 2022-11-10 LAB — GLUCOSE, CAPILLARY
Glucose-Capillary: 253 mg/dL — ABNORMAL HIGH (ref 70–99)
Glucose-Capillary: 287 mg/dL — ABNORMAL HIGH (ref 70–99)
Glucose-Capillary: 295 mg/dL — ABNORMAL HIGH (ref 70–99)
Glucose-Capillary: 230 mg/dL — ABNORMAL HIGH (ref 70–99)

## 2022-11-10 LAB — BASIC METABOLIC PANEL
Anion gap: 10 (ref 5–15)
BUN: 19 mg/dL (ref 8–23)
CO2: 23 mmol/L (ref 22–32)
Calcium: 8.9 mg/dL (ref 8.9–10.3)
Chloride: 100 mmol/L (ref 98–111)
Creatinine, Ser: 0.9 mg/dL (ref 0.44–1.00)
GFR, Estimated: >60 mL/min (ref >60)
Glucose, Bld: 296 mg/dL — ABNORMAL HIGH (ref 70–99)
Potassium: 4 mmol/L (ref 3.5–5.1)
Sodium: 133 mmol/L — ABNORMAL LOW (ref 135–145)

## 2022-11-10 LAB — ECHOCARDIOGRAM LIMITED
Height: 62 in
S' Lateral: 2.6 cm
Weight: 3040.58 oz

## 2022-11-10 LAB — C DIFFICILE QUICK SCREEN W PCR REFLEX
C Diff antigen: NEGATIVE
C Diff interpretation: NOT DETECTED
C Diff toxin: NEGATIVE

## 2022-11-10 LAB — URINE CULTURE: Culture: 10000 — AB

## 2022-11-10 MED ORDER — NYSTATIN 100000 UNIT/GM EX POWD
Freq: Two times a day (BID) | CUTANEOUS | Status: DC
  Filled 2022-11-10: qty 15

## 2022-11-10 MED ORDER — NALOXONE HCL 4 MG/0.1ML NA LIQD
1.0000 | Freq: Once | NASAL | Status: AC
  Administered 2022-11-10: 1 via NASAL
  Filled 2022-11-10: qty 4
  Filled 2022-11-10: qty 8

## 2022-11-10 MED ORDER — INSULIN ASPART 100 UNIT/ML IJ SOLN
4.0000 [IU] | Freq: Three times a day (TID) | INTRAMUSCULAR | Status: DC
  Administered 2022-11-10 – 2022-11-11 (×2): 4 [IU] via SUBCUTANEOUS

## 2022-11-10 NOTE — Progress Notes (Signed)
PROGRESS NOTE Amber Stephenson  ZOX:[id number] DOB: 08/06/1945 DOA: 11/09/2022 PCP: Mechele Claude, MD  Brief Narrative/Hospital Course: 77 y.o.f w/ A FIB on Eliquis, PE, CAD, DM type II, bipolar disorder, chronic back pain followed by pain management presented with 3 falls within 24.  Was seen in the ED 7 days PTA due to rash on her left side, husband noticed she had bruising from prior fall but had some redness and open sore which was thought to be shingle outbreak and had been seen by dermatology the next day and they were told that that was likely not the shingles, but to complete the course of Valtrex to be on the safe side.  She has been having hyperglycemia x 3 days with urinary frequency/discomfort. Patient has history of falling and was previously recommended for her to use a walker. In the ED afebrile vital stable,CT scan of the head and cervical spine did not note any acute intracranial or cervical spine injury and a scalp hematoma without calvarial fracture.  Labs significant for sodium 131, glucose 368, BUN 31, creatinine 1.06, high-sensitivity troponins negative x 2.  Urinalysis abnormal concerning for UTI given IV fluids antibiotics and admitted      Subjective: Patient seen and examined this morning husband at the bedside patient is alert awake oriented x 3 denies any complaints dizziness chest pain. Patient reports she was aware of passing out yesterday-has never passed out before  Assessment and Plan: Principal Problem:   Frequent falls Active Problems:   Scalp hematoma, initial encounter   UTI (urinary tract infection)   Diabetic neuropathy (HCC)   Type II diabetes mellitus (HCC)   Chronic atrial fibrillation   Long term current use of anticoagulant   Vitamin B12 deficiency   Bipolar 1 disorder (HCC)   Essential hypertension   Chronic right-sided low back pain with right-sided sciatica   Syncopal episode overnight since admission 6/9 night> patient had a large bowel  movement in the bed and when nursing staff were trying to assist her cleaning up she sat at the side of the bed and had an episode of loss of consciousness and fell back in the bed without LOC.  Patient was placed on cardiac monitor EKG troponin CT head echocardiogram ordered along with fall precaution.  CT head evolving soft tissue hematoma of the skull vertex ABG stable.  Orthostatic vitals pending PT OT requested.  If BP soft her mentation slow will need to hold her morphine which is her chronic meds.  She is also on Ativan at home chronically.  Reluctant to come off this medication.  Husband and patient are planning to discuss with pain management further, has been weaning down.  On gentle IV fluid hydration.  Frequent falls Scalp hematoma secondary to fall: Syncopal episode noted as above, no prior history of syncope.  Continue symptomatic management PT OT.  PT will normal TSH stable recently.  Check vitamin D.  Add compression stocking, check orthostatic vitals.  May need placement.  Readdressed and discussed need to wean off chronic pain medication and benzodiazepine  UTI: Continue antibiotics follow-up urine culture.  Uncontrolled type 2 diabetes mellitus with hyperglycemia with long-term use of insulin Diabetic neuropathy: Monitor HbA1c, PTA on sliding scale insulin and Lantus 20 units twice daily. Continue SSI.DM coordinator consulted Recent Labs  Lab 11/09/22 1637 11/09/22 2036 11/09/22 2244 11/10/22 0730  GLUCAP 320* 348* 325* 253*    B12 deficiency, currently level normal  A-fib on chronic anticoagulation rate controlled on Eliquis.  Bipolar disorder continue Abilify, Ativan changed to as needed instead of scheduled given falls  Essential hypertension BP currently stable holding meds.  Check orthostatic vitals.  At home on Lasix, losartan, lisinopril and metoprolol.  Bruise versus shingles : Most likely bruise recently seen by dermatology.  Monitor off Valtrex  Chronic back  pain: on chronic pain meds and chronic benzodiazepine- reports no issues with balance or dizziness or somnolence.  Reluctant to come off, BP soft will need to hold off cut down the dose.  Diarrhea ordered C diff  Class I Obesity:Patient's Body mass index is 34.76 kg/m. : Will benefit with PCP follow-up, weight loss  healthy lifestyle and outpatient sleep evaluation.  DVT prophylaxis: eliquis Code Status:   Code Status: Full Code Family Communication: plan of care discussed with patient/husband at bedside. Patient status is:  admitted as observation but remains hospitalized for ongoing  because of uti syncope Level of care: Progressive   Dispo: The patient is from: home w/ husband            Anticipated disposition: TBD Objective: Vitals last 24 hrs: Vitals:   11/10/22 0101 11/10/22 0509 11/10/22 0740 11/10/22 1040  BP: (!) 153/94 126/80 (!) 156/93 (!) 150/90  Pulse: 81 60 73 87  Resp: 20 20 (!) 21 (!) 28  Temp: 98.3 F (36.8 C)  98.4 F (36.9 C)   TempSrc: Oral Oral Oral   SpO2: 96% 97% 92% 95%  Weight:      Height:       Weight change:   Physical Examination:  General exam: alert awake, older than stated age HEENT:Oral mucosa moist, Ear/Nose WNL grossly Respiratory system: bilaterally diminished BS, no use of accessory muscle Cardiovascular system: S1 & S2 +, No JVD. Gastrointestinal system: Abdomen soft,NT,ND, BS+ Nervous System:Alert, awake, moving extremities. Extremities: LE edema neg,distal peripheral pulses palpable.  Skin: No rashes,no icterus. MSK: Normal muscle bulk,tone, power  Medications reviewed:  Scheduled Meds:  allopurinol  100 mg Oral BID   apixaban  5 mg Oral BID   ARIPiprazole  2 mg Oral QHS   insulin aspart  0-15 Units Subcutaneous TID WC   insulin aspart  0-5 Units Subcutaneous QHS   insulin glargine-yfgn  20 Units Subcutaneous BID   mometasone-formoterol  2 puff Inhalation BID   morphine  15 mg Oral Q12H   naloxone  1 spray Nasal Once    nystatin   Topical BID   OXcarbazepine  150 mg Oral BID   pregabalin  50 mg Oral QHS   sodium chloride flush  3 mL Intravenous Q12H   Continuous Infusions:  sodium chloride 75 mL/hr at 11/10/22 0743   cefTRIAXone (ROCEPHIN)  IV 1 g (11/10/22 1052)      Diet Order             Diet Carb Modified Fluid consistency: Thin; Room service appropriate? Yes  Diet effective now                   Intake/Output Summary (Last 24 hours) at 11/10/2022 1151 Last data filed at 11/10/2022 0839 Gross per 24 hour  Intake 850 ml  Output 1000 ml  Net -150 ml   Net IO Since Admission: 350 mL [11/10/22 1151]  Wt Readings from Last 3 Encounters:  11/09/22 86.2 kg  10/22/22 86.2 kg  10/08/22 87.1 kg     Unresulted Labs (From admission, onward)     Start     Ordered   11/10/22 0823  C  Difficile Quick Screen w PCR reflex  (C Difficile quick screen w PCR reflex panel )  Once, for 24 hours,   TIMED       References:    CDiff Information Tool   11/10/22 1610           Data Reviewed: I have personally reviewed following labs and imaging studies CBC: Recent Labs  Lab 11/09/22 0624 11/09/22 2246 11/10/22 0136  WBC 9.3 8.2 8.4  HGB 13.6  13.9 12.1 12.9  HCT 40.8  41.0 36.6 39.3  MCV 86.4 86.5 89.7  PLT 215 185 185   Basic Metabolic Panel: Recent Labs  Lab 11/09/22 0624 11/09/22 2246 11/10/22 0136  NA 131*  132* 133* 133*  K 4.5  4.4 4.4 4.0  CL 96*  98 98 100  CO2 22 24 23   GLUCOSE 366*  368* 328* 296*  BUN 24*  31* 21 19  CREATININE 1.06*  1.00 0.88 0.90  CALCIUM 9.5 9.0 8.9   GFR: Estimated Creatinine Clearance: 55 mL/min (by C-G formula based on SCr of 0.9 mg/dL). Liver Function Tests: Recent Labs  Lab 11/09/22 0624  AST 22  ALT 13  ALKPHOS 106  BILITOT 0.9  PROT 6.8  ALBUMIN 3.3*   Recent Labs  Lab 11/09/22 0624  INR 1.6*   Recent Labs  Lab 11/09/22 1637 11/09/22 2036 11/09/22 2244 11/10/22 0730  GLUCAP 320* 348* 325* 253*   Recent Labs   Lab 11/09/22 0625  LATICACIDVEN 1.7    Recent Results (from the past 240 hour(s))  Urine Culture     Status: Abnormal   Collection Time: 11/09/22 10:37 AM   Specimen: Urine, Clean Catch  Result Value Ref Range Status   Specimen Description URINE, CLEAN CATCH  Final   Special Requests   Final    NONE Performed at [facility], [address] [place], [place] [address]    Culture 10,000 COLONIES/mL YEAST (A)  Final   Report Status 11/10/2022 FINAL  Final    Antimicrobials: Anti-infectives (From admission, onward)    Start     Dose/Rate Route Frequency Ordered Stop   11/10/22 1100  cefTRIAXone (ROCEPHIN) 1 g in sodium chloride 0.9 % 100 mL IVPB        1 g 200 mL/hr over 30 Minutes Intravenous Every 24 hours 11/09/22 1256     11/09/22 1145  cefTRIAXone (ROCEPHIN) 1 g in sodium chloride 0.9 % 100 mL IVPB        1 g 200 mL/hr over 30 Minutes Intravenous  Once 11/09/22 1136 11/09/22 1304      Culture/Microbiology    Component Value Date/Time   SDES URINE, CLEAN CATCH 11/09/2022 1037   SPECREQUEST  11/09/2022 1037    NONE Performed at [facility], [address] [place], [place] [address]    CULT 10,000 COLONIES/mL YEAST (A) 11/09/2022 1037   REPTSTATUS 11/10/2022 FINAL 11/09/2022 1037    Radiology Studies: CT HEAD WO CONTRAST ( )  Result Date: 11/09/2022 CLINICAL DATA:  For syncope/presyncope. EXAM: CT HEAD WITHOUT CONTRAST TECHNIQUE: Contiguous axial images were obtained from the base of the skull through the vertex without intravenous contrast. RADIATION DOSE REDUCTION: This exam was performed according to the departmental dose-optimization program which includes automated exposure control, adjustment of the mA and/or kV according to patient size and/or use of iterative reconstruction technique. COMPARISON:  Prior study from earlier the same day. FINDINGS: Brain: Cerebral volume within normal limits. No acute intracranial hemorrhage. No  acute large vessel  territory infarct. No mass lesion, mass effect or midline shift. No hydrocephalus or extra-axial fluid collection. Vascular: No abnormal hyperdense vessel. Scattered vascular calcifications noted within the carotid siphons. Skull: Involving soft tissue hematoma at the scalp vertex. Calvarium intact without fracture. Sinuses/Orbits: Globes and orbital soft tissues within normal limits. Paranasal sinuses and mastoid air cells are clear. Other: None. IMPRESSION: 1. Stable head CT.  No acute intracranial abnormality. 2. Evolving soft tissue hematoma at the scalp vertex. No calvarial fracture. Electronically Signed   By: Rise Mu M.D.   On: 11/09/2022 22:58   DG Shoulder Right  Result Date: 11/09/2022 CLINICAL DATA:  Fall in bathroom this morning EXAM: RIGHT SHOULDER - 2+ VIEW COMPARISON:  08/19/2019 right shoulder radiographs FINDINGS: Status post right total shoulder arthroplasty with no evidence of hardware fracture or loosening. No glenohumeral dislocation. No osseous fracture. No evidence of acromioclavicular separation. No suspicious focal osseous lesions. No significant AC joint arthropathy. No pathologic soft tissue calcifications. IMPRESSION: No right shoulder dislocation or osseous fracture. Status post right total shoulder arthroplasty, with no evidence of hardware complication. Electronically Signed   By: Delbert Phenix M.D.   On: 11/09/2022 08:17   DG Shoulder Left  Result Date: 11/09/2022 CLINICAL DATA:  Fall in bathroom this morning EXAM: LEFT SHOULDER - 2+ VIEW COMPARISON:  None Available. FINDINGS: No left shoulder fracture. No glenohumeral dislocation. No evidence of acromioclavicular separation. No significant AC joint arthropathy. Mild inferior glenohumeral osteoarthritis. No suspicious focal osseous lesions. No radiopaque foreign bodies or pathologic soft tissue calcifications. IMPRESSION: No left shoulder fracture or malalignment. Mild inferior glenohumeral osteoarthritis.  Electronically Signed   By: Delbert Phenix M.D.   On: 11/09/2022 08:15   DG Chest Port 1 View  Result Date: 11/09/2022 CLINICAL DATA:  Fall this morning in bathroom EXAM: PORTABLE CHEST 1 VIEW COMPARISON:  09/01/2022 chest radiograph. FINDINGS: Partially visualized right shoulder arthroplasty. Stable cardiomediastinal silhouette with mild cardiomegaly. No pneumothorax. No pleural effusion. No overt pulmonary edema. No acute consolidative airspace disease. No displaced fractures in the visualized chest IMPRESSION: Mild cardiomegaly without overt pulmonary edema. No active pulmonary disease. Electronically Signed   By: Delbert Phenix M.D.   On: 11/09/2022 08:14   DG Pelvis Portable  Result Date: 11/09/2022 CLINICAL DATA:  Trauma EXAM: PORTABLE PELVIS 1-2 VIEWS COMPARISON:  None Available. FINDINGS: No pelvic fracture or diastasis. Mild lower lumbar degenerative changes. No suspicious focal osseous lesions. No significant hip arthropathy. No evidence of hip dislocation on this single frontal view. IMPRESSION: No pelvic fracture. Electronically Signed   By: Delbert Phenix M.D.   On: 11/09/2022 08:12   CT Head Wo Contrast  Result Date: 11/09/2022 CLINICAL DATA:  Level 2 trauma due to fall EXAM: CT HEAD WITHOUT CONTRAST CT CERVICAL SPINE WITHOUT CONTRAST TECHNIQUE: Multidetector CT imaging of the head and cervical spine was performed following the standard protocol without intravenous contrast. Multiplanar CT image reconstructions of the cervical spine were also generated. RADIATION DOSE REDUCTION: This exam was performed according to the departmental dose-optimization program which includes automated exposure control, adjustment of the mA and/or kV according to patient size and/or use of iterative reconstruction technique. COMPARISON:  10/09/2022 FINDINGS: CT HEAD FINDINGS Brain: No evidence of acute infarction, hemorrhage, hydrocephalus, extra-axial collection or mass lesion/mass effect. Vascular: No hyperdense  vessel or unexpected calcification. Skull: Scalp hematoma at the vertex.  No acute fracture Sinuses/Orbits: No evidence of injury CT CERVICAL SPINE FINDINGS Alignment: No traumatic malalignment Skull base and vertebrae:  No acute fracture Soft tissues and spinal canal: No prevertebral fluid or swelling. No visible canal hematoma. Disc levels: Generalized disc space narrowing with endplate and facet spurring. Upper chest: Clear apical lungs IMPRESSION: 1. No evidence of acute intracranial or cervical spine injury. 2. Scalp hematoma without calvarial fracture. Electronically Signed   By: Tiburcio Pea M.D.   On: 11/09/2022 07:11   CT Cervical Spine Wo Contrast  Result Date: 11/09/2022 CLINICAL DATA:  Level 2 trauma due to fall EXAM: CT HEAD WITHOUT CONTRAST CT CERVICAL SPINE WITHOUT CONTRAST TECHNIQUE: Multidetector CT imaging of the head and cervical spine was performed following the standard protocol without intravenous contrast. Multiplanar CT image reconstructions of the cervical spine were also generated. RADIATION DOSE REDUCTION: This exam was performed according to the departmental dose-optimization program which includes automated exposure control, adjustment of the mA and/or kV according to patient size and/or use of iterative reconstruction technique. COMPARISON:  10/09/2022 FINDINGS: CT HEAD FINDINGS Brain: No evidence of acute infarction, hemorrhage, hydrocephalus, extra-axial collection or mass lesion/mass effect. Vascular: No hyperdense vessel or unexpected calcification. Skull: Scalp hematoma at the vertex.  No acute fracture Sinuses/Orbits: No evidence of injury CT CERVICAL SPINE FINDINGS Alignment: No traumatic malalignment Skull base and vertebrae: No acute fracture Soft tissues and spinal canal: No prevertebral fluid or swelling. No visible canal hematoma. Disc levels: Generalized disc space narrowing with endplate and facet spurring. Upper chest: Clear apical lungs IMPRESSION: 1. No evidence of  acute intracranial or cervical spine injury. 2. Scalp hematoma without calvarial fracture. Electronically Signed   By: Tiburcio Pea M.D.   On: 11/09/2022 07:11     LOS: 1 day   Lanae Boast, MD [facility]  11/10/2022, 11:51 AM

## 2022-11-10 NOTE — Telephone Encounter (Signed)
Cancelled FU appt. Hospitalized with Lung issues. FYI only.

## 2022-11-10 NOTE — TOC CAGE-AID Note (Signed)
Transition of Care [facility]) - CAGE-AID Screening   Patient Details  Name: Amber Stephenson MRN: [id number] Date of Birth: 1945/10/19  Transition of Care [facility]) CM/SW Contact:    [name], RN Phone Number: [phone] 11/10/2022, 8:36 AM   Clinical Narrative: Pt here after having a fall.  Pt denies alcohol or illicit drug use.  Screening complete.   CAGE-AID Screening:    Have You Ever Felt You Ought to Cut Down on Your Drinking or Drug Use?: No Have People Annoyed You By Critizing Your Drinking Or Drug Use?: No Have You Felt Bad Or Guilty About Your Drinking Or Drug Use?: No Have You Ever Had a Drink or Used Drugs First Thing In The Morning to Steady Your Nerves or to Get Rid of a Hangover?: No CAGE-AID Score: 0  Substance Abuse Education Offered: No

## 2022-11-10 NOTE — Inpatient Diabetes Management (Addendum)
Inpatient Diabetes Program Recommendations  AACE/ADA: New Consensus Statement on Inpatient Glycemic Control (2015)  Target Ranges:  Prepandial:   less than 140 mg/dL      Peak postprandial:   less than 180 mg/dL (1-2 hours)      Critically ill patients:  140 - 180 mg/dL   Lab Results  Component Value Date   GLUCAP 253 (H) 11/10/2022   HGBA1C 6.8 (A) 05/28/2022    Review of Glycemic Control  Latest Reference Range & Units 11/09/22 16:37 11/09/22 20:36 11/09/22 22:44 11/10/22 07:30  Glucose-Capillary 70 - 99 mg/dL 086 (H) 578 (H) 469 (H) 253 (H)   Diabetes history: DM 2 Outpatient Diabetes medications:  Dexcom G7 Lantus 20 units bid Humalog 4-8 units tid with meals Current orders for Inpatient glycemic control:  Novolog 0-15 units tid with meals and HS Semglee 20 units bid  Inpatient Diabetes Program Recommendations:    Agree with addition of Semglee 20 units bid.  Please also consider adding Novolog 4 units tid with meals (meal coverage- Hold if patient eats less than 50% or NPO).  Also consider adding A1C to labs.    Thanks,  Beryl Meager, RN, BC-ADM Inpatient Diabetes Coordinator Pager [phone] (8a-5p)

## 2022-11-10 NOTE — Hospital Course (Addendum)
77 y.o.f w/ A FIB on Eliquis, PE, CAD, DM type II, bipolar disorder, chronic back pain followed by pain management presented with 3 falls within 24.  Was seen in the ED 7 days PTA due to rash on her left side, husband noticed she had bruising from prior fall but had some redness and open sore which was thought to be shingle outbreak and had been seen by dermatology the next day and they were told that that was likely not the shingles, but to complete the course of Valtrex to be on the safe side.  She has been having hyperglycemia x 3 days with urinary frequency/discomfort. Patient has history of falling and was previously recommended for her to use a walker. In the ED afebrile vital stable,CT scan of the head and cervical spine did not note any acute intracranial or cervical spine injury and a scalp hematoma without calvarial fracture.  Labs significant for sodium 131, glucose 368, BUN 31, creatinine 1.06, high-sensitivity troponins negative x 2.  Urinalysis abnormal concerning for UTI given IV fluids antibiotics and admitted. She had episode of syncope after BM, underwent work up, echo was stable LVEF: 65 to 70%. No regional  wall motion abnormalities. There is mild concentric left ventricular  hypertrophy. Left ventricular diastolic  parameters are indeterminate.  She has been ambulating and doing well. PT OT advised skilled nursing facility but patient declined and wants to go home with home health PT OT and husband has agreed. Seen by diabetes coordinator for her hyperglycemia.  She was given IV antibiotics for UTI.  Culture 10,000 colonies yeast-also complete vaginal itching, given Diflucan to cover. At this time she feels well and is stable for discharge home.

## 2022-11-10 NOTE — Progress Notes (Signed)
  Echocardiogram 2D Echocardiogram has been performed.  Delcie Roch 11/10/2022, 5:54 PM

## 2022-11-10 NOTE — Progress Notes (Signed)
   11/10/22 1422  Spiritual Encounters  Type of Visit Initial  Care provided to: Pt and family  Referral source Nurse (RN/NT/LPN)  Reason for visit Advance directives  OnCall Visit No  Spiritual Framework  Presenting Themes Other (comment) (Prayer)  Patient Stress Factors Health changes  Family Stress Factors Health changes  Interventions  Spiritual Care Interventions Made Established relationship of care and support;Compassionate presence;Reflective listening;Prayer  Intervention Outcomes  Outcomes Connection to spiritual care;Awareness of support  Advance Directives (For Healthcare)  Does Patient Have a Medical Advance Directive? No  Would patient like information on creating a medical advance directive? Yes (Inpatient - patient requests chaplain consult to create a medical advance directive)   Chaplain provided AD education to patient and her spouse. AD papers were provided and left for the patient and her spouse to complete. Chaplain made patient aware of how to contact spiritual care when/if the documents are completed and ready to be notarized. Patient asked for prayer for her daughter. Chaplain prayed with patient and her spouse.   Arlyce Dice, Chaplain Resident [phone]

## 2022-11-11 ENCOUNTER — Other Ambulatory Visit: Payer: Self-pay

## 2022-11-11 ENCOUNTER — Other Ambulatory Visit: Payer: Self-pay | Admitting: *Deleted

## 2022-11-11 DIAGNOSIS — R296 Repeated falls: Secondary | ICD-10-CM | POA: Diagnosis not present

## 2022-11-11 LAB — GLUCOSE, CAPILLARY
Glucose-Capillary: 185 mg/dL — ABNORMAL HIGH (ref 70–99)
Glucose-Capillary: 288 mg/dL — ABNORMAL HIGH (ref 70–99)

## 2022-11-11 MED ORDER — FLUCONAZOLE 200 MG PO TABS: 200.0000 mg | ORAL_TABLET | Freq: Every day | ORAL | 0 refills | Status: AC

## 2022-11-11 MED ORDER — MOUNJARO 5 MG/0.5ML ~~LOC~~ SOAJ: 5.0000 mg | SUBCUTANEOUS | 2 refills | Status: DC

## 2022-11-11 MED ORDER — INSULIN GLARGINE-YFGN 100 UNIT/ML ~~LOC~~ SOLN
25.0000 [IU] | Freq: Two times a day (BID) | SUBCUTANEOUS | Status: DC
  Filled 2022-11-11: qty 0.25

## 2022-11-11 MED ORDER — FLUCONAZOLE 200 MG PO TABS
200.0000 mg | ORAL_TABLET | Freq: Every day | ORAL | Status: DC
  Administered 2022-11-11: 200 mg via ORAL
  Filled 2022-11-11: qty 1

## 2022-11-11 MED ORDER — LANTUS SOLOSTAR 100 UNIT/ML ~~LOC~~ SOPN: 25.0000 [IU] | PEN_INJECTOR | Freq: Two times a day (BID) | SUBCUTANEOUS | 3 refills | Status: DC

## 2022-11-11 NOTE — Evaluation (Signed)
Occupational Therapy Evaluation Patient Details Name: Amber Stephenson MRN: [id number] DOB: 05/07/1946 Today's Date: 11/11/2022   History of Present Illness The pt is a 77 yo female presenting 6/9 after a fall. No acute injury or findings on imaging. Pt with scalp hematoma, concern for UTI, syncopal symptoms during hospitalization. PMH includes: increased falls recently, GAD, HTN, HLD BPPV, CAD nonobstructive, CHF, chronic pain, PTSD diabetes mellitus with neuropathy, nonalcoholic fatty liver disease OSA using nocturnal oxygen/pulmonary hypertension, restless leg syndrome, and tremor.   Clinical Impression   Patient admitted for the diagnosis above.  PTA she lives at home with her spouse, who provides assist as needed for ADL, iADL and medication management.  Patient is scheduled to discharge home today.  Currently she is needing up to Min A for basic mobility and ADL completion from a sit to stand level.  Patient and spouse believe they have all needed DME, and no post acute OT is anticipated given husbands ability to assist.          Recommendations for follow up therapy are one component of a multi-disciplinary discharge planning process, led by the attending physician.  Recommendations may be updated based on patient status, additional functional criteria and insurance authorization.   Assistance Recommended at Discharge Intermittent Supervision/Assistance  Patient can return home with the following Assist for transportation;Assistance with cooking/housework;A little help with bathing/dressing/bathroom;A little help with walking and/or transfers;Help with stairs or ramp for entrance    Functional Status Assessment  Patient has had a recent decline in their functional status and demonstrates the ability to make significant improvements in function in a reasonable and predictable amount of time.  Equipment Recommendations  None recommended by OT    Recommendations for Other Services        Precautions / Restrictions Precautions Precautions: Fall Restrictions Weight Bearing Restrictions: No      Mobility Bed Mobility Overal bed mobility: Needs Assistance Bed Mobility: Supine to Sit, Sit to Supine     Supine to sit: Supervision, HOB elevated Sit to supine: Supervision        Transfers                          Balance Overall balance assessment: Needs assistance Sitting-balance support: Feet supported, No upper extremity supported Sitting balance-Leahy Scale: Good     Standing balance support: Reliant on assistive device for balance Standing balance-Leahy Scale: Fair                             ADL either performed or assessed with clinical judgement   ADL       Grooming: Wash/dry hands;Wash/dry face;Set up;Sitting               Lower Body Dressing: Minimal assistance;Sit to/from stand   Toilet Transfer: Min guard;Rolling walker (2 wheels);Regular Toilet;Ambulation                   Vision Baseline Vision/History: 1 Wears glasses Patient Visual Report: No change from baseline       Perception     Praxis      Pertinent Vitals/Pain Pain Assessment Pain Assessment: Faces Faces Pain Scale: Hurts little more Pain Location: Hips Pain Descriptors / Indicators: Aching Pain Intervention(s): Monitored during session     Hand Dominance Right   Extremity/Trunk Assessment Upper Extremity Assessment Upper Extremity Assessment: Overall WFL for tasks assessed   Lower Extremity  Assessment Lower Extremity Assessment: Defer to PT evaluation   Cervical / Trunk Assessment Cervical / Trunk Assessment: Kyphotic   Communication Communication Communication: No difficulties   Cognition Arousal/Alertness: Awake/alert Behavior During Therapy: WFL for tasks assessed/performed Overall Cognitive Status: Impaired/Different from baseline                     Current Attention Level: Alternating        Awareness: Emergent Problem Solving: Slow processing       General Comments       Exercises     Shoulder Instructions      Home Living Family/patient expects to be discharged to:: Private residence Living Arrangements: Spouse/significant other Available Help at Discharge: Family;Available 24 hours/day Type of Home: House Home Access: Stairs to enter [employer] of Steps: 5 vs 3.  3 in the front Entrance Stairs-Rails: Left Home Layout: One level     Bathroom Shower/Tub: Chief Strategy Officer: Standard Bathroom Accessibility: Yes   Home Equipment: Agricultural consultant (2 wheels);Cane - single point;Grab bars - tub/shower;Grab bars - toilet   Additional Comments: spouse present and confirmed      Prior Functioning/Environment Prior Level of Function : Needs assist               ADLs Comments: Spouse provides assist as needed with ADL, iADL, community mobility and medication management.        OT Problem List: Decreased strength;Decreased safety awareness;Impaired balance (sitting and/or standing)      OT Treatment/Interventions:      OT Goals(Current goals can be found in the care plan section) Acute Rehab OT Goals Patient Stated Goal: Return home OT Goal Formulation: With patient Time For Goal Achievement: 11/14/22 Potential to Achieve Goals: Good  OT Frequency:      Co-evaluation              AM-PAC OT "6 Clicks" Daily Activity     Outcome Measure Help from another person eating meals?: None Help from another person taking care of personal grooming?: None Help from another person toileting, which includes using toliet, bedpan, or urinal?: A Little Help from another person bathing (including washing, rinsing, drying)?: A Little Help from another person to put on and taking off regular upper body clothing?: None Help from another person to put on and taking off regular lower body clothing?: A Little 6 Click Score: 21   End of  Session Equipment Utilized During Treatment: Gait belt;Rolling walker (2 wheels) Nurse Communication: Mobility status  Activity Tolerance: Patient tolerated treatment well Patient left: in bed;with call bell/phone within reach;with family/visitor present  OT Visit Diagnosis: Unsteadiness on feet (R26.81)                Time: 1610-9604 OT Time Calculation (min): 17 min Charges:  OT General Charges $OT Visit: 1 Visit OT Evaluation $OT Eval Moderate Complexity: 1 Mod  11/11/2022  RP, OTR/L  Acute Rehabilitation Services  Office:  [phone]   Suzanna Obey 11/11/2022, 2:30 PM

## 2022-11-11 NOTE — Discharge Summary (Signed)
Physician Discharge Summary  Amber Stephenson ZOX:[id number] DOB: 1945-12-02 DOA: 11/09/2022  PCP: Mechele Claude, MD  Admit date: 11/09/2022 Discharge date: 11/11/2022 Recommendations for Outpatient Follow-up:  Follow up with PCP in 1 weeks-call for appointment Please obtain BMP/CBC in one week  Discharge Dispo: home w/ [facility] Discharge Condition: Stable Code Status:   Code Status: Full Code Diet recommendation:  Diet Order             Diet Carb Modified Fluid consistency: Thin; Room service appropriate? Yes  Diet effective now                    Brief/Interim Summary: 77 y.o.f w/ A FIB on Eliquis, PE, CAD, DM type II, bipolar disorder, chronic back pain followed by pain management presented with 3 falls within 24.  Was seen in the ED 7 days PTA due to rash on her left side, husband noticed she had bruising from prior fall but had some redness and open sore which was thought to be shingle outbreak and had been seen by dermatology the next day and they were told that that was likely not the shingles, but to complete the course of Valtrex to be on the safe side.  She has been having hyperglycemia x 3 days with urinary frequency/discomfort. Patient has history of falling and was previously recommended for her to use a walker. In the ED afebrile vital stable,CT scan of the head and cervical spine did not note any acute intracranial or cervical spine injury and a scalp hematoma without calvarial fracture.  Labs significant for sodium 131, glucose 368, BUN 31, creatinine 1.06, high-sensitivity troponins negative x 2.  Urinalysis abnormal concerning for UTI given IV fluids antibiotics and admitted. She had episode of syncope after BM, underwent work up, echo was stable LVEF: 65 to 70%. No regional  wall motion abnormalities. There is mild concentric left ventricular  hypertrophy. Left ventricular diastolic  parameters are indeterminate.  She has been ambulating and doing well. PT OT advised skilled  nursing facility but patient declined and wants to go home with home health PT OT and husband has agreed. Seen by diabetes coordinator for her hyperglycemia.  She was given IV antibiotics for UTI.  Culture 10,000 colonies yeast-also complete vaginal itching, given Diflucan to cover. At this time she feels well and is stable for discharge home.    Discharge Diagnoses:  Principal Problem:   Frequent falls Active Problems:   Scalp hematoma, initial encounter   UTI (urinary tract infection)   Diabetic neuropathy (HCC)   Type II diabetes mellitus (HCC)   Chronic atrial fibrillation   Long term current use of anticoagulant   Vitamin B12 deficiency   Bipolar 1 disorder (HCC)   Essential hypertension   Chronic right-sided low back pain with right-sided sciatica   Syncopal episode overnight since admission 6/9 night> patient had a large bowel movement in the bed and when nursing staff were trying to assist her cleaning up she sat at the side of the bed and had an episode of loss of consciousness and fell back in the bed without LOC.  Patient was placed on cardiac monitor EKG troponin CT head echocardiogram ordered along with fall precaution.  CT head evolving soft tissue hematoma of the skull vertex ABG stable.  Echocardiogram unremarkable, orthostatics ordered PT OT eval done recommending skilled nursing facility but at this time she is refusing and wants to go home    Frequent falls Scalp hematoma secondary to fall:  Syncopal episode noted as above, no prior history of syncope.  Continue symptomatic management PT OT.  PT will normal TSH stable recently.   Prn compression stocking, checked orthostatic vitals and is negative.Refusing snf now. Educated her to nwean off chronic pain medication and benzodiazepine   UTI: Urine culture unremarkable except for yeast ?Vaginal yeast infection continue Diflucan   Uncontrolled type 2 diabetes mellitus with hyperglycemia with long-term use of  insulin Diabetic neuropathy: PTA on sliding scale insulin and Lantus 20 units twice daily> remains poorly controlled increase Lantus to 20 units twice daily added Premeal insulin SSI while here seen by DM coordinator   B12 deficiency, currently level normal   A-fib on chronic anticoagulation rate controlled on Eliquis.   Bipolar disorder continue Abilify, Ativan changed to as needed instead of scheduled given falls   Essential hypertension BP stable. At home on Lasix, losartan, lisinopril and metoprolol and has not needed.  Resume metoprolol upon discharge for her A-fib   Bruise versus shingles : Most likely bruise recently seen by dermatology.  Monitor off Valtrex   Chronic back pain: on chronic pain meds and chronic benzodiazepine- reports no issues with balance or dizziness or somnolence.  Reluctant to come off, BP soft will need to hold off cut down the dose.   Diarrhea ordered C diff and neg. diarrhea better   Class I Obesity:Patient's Body mass index is 34.76 kg/m. : Will benefit with PCP follow-up, weight loss  healthy lifestyle and outpatient sleep evaluation.  Consults: none Subjective: Alert awake oriented resting comfortably eager to go home today refusing to go to skilled nursing facility  Discharge Exam: Vitals:   11/11/22 0826 11/11/22 1239  BP: 133/83 (!) 147/85  Pulse: 93   Resp: 14 16  Temp: 97.7 F (36.5 C) (!) 97.5 F (36.4 C)  SpO2: 96% 95%   General: Pt is alert, awake, not in acute distress Cardiovascular: RRR, S1/S2 +, no rubs, no gallops Respiratory: CTA bilaterally, no wheezing, no rhonchi Abdominal: Soft, NT, ND, bowel sounds + Extremities: no edema, no cyanosis  Discharge Instructions  Discharge Instructions     Discharge instructions   Complete by: As directed    Please follow-up with your pain management clinic and try to wean off chronic narcotics and benzodiazepine  Please follow-up with primary care doctor within a week of blood  pressure medication has been held as blood pressure is doing well without them . Continue on metoprolol at home but hold if bp < 100/60 or hr < 60/min  Please call call MD or return to ER for similar or worsening recurring problem that brought you to hospital or if any fever,nausea/vomiting,abdominal pain, uncontrolled pain, chest pain,  shortness of breath or any other alarming symptoms.  Please follow-up your doctor as instructed in a week time and call the office for appointment.  Please avoid alcohol, smoking, or any other illicit substance and maintain healthy habits including taking your regular medications as prescribed.  You were cared for by a hospitalist during your hospital stay. If you have any questions about your discharge medications or the care you received while you were in the hospital after you are discharged, you can call the unit and ask to speak with the hospitalist on call if the hospitalist that took care of you is not available.  Once you are discharged, your primary care physician will handle any further medical issues. Please note that NO REFILLS for any discharge medications will be authorized once you are  discharged, as it is imperative that you return to your primary care physician (or establish a relationship with a primary care physician if you do not have one) for your aftercare needs so that they can reassess your need for medications and monitor your lab values   Increase activity slowly   Complete by: As directed       Allergies as of 11/11/2022       Reactions   Iodine Anaphylaxis   Ivp Dye [iodinated Contrast Media] Anaphylaxis, Swelling   Throat closes   Latex Other (See Comments)   Latex tape pulls skin with it   Tizanidine Other (See Comments)   Weakness, goofy, bad dreams        Medication List     STOP taking these medications    furosemide 20 MG tablet Commonly known as: LASIX   lisinopril 10 MG tablet Commonly known as: ZESTRIL    losartan 25 MG tablet Commonly known as: COZAAR       TAKE these medications    albuterol 108 (90 Base) MCG/ACT inhaler Commonly known as: VENTOLIN HFA Inhale 2 puffs into the lungs every 6 (six) hours as needed for wheezing or shortness of breath.   allopurinol 100 MG tablet Commonly known as: ZYLOPRIM Take 100 mg by mouth 2 (two) times daily.   apixaban 5 MG Tabs tablet Commonly known as: Eliquis Take 1 tablet (5 mg total) by mouth 2 (two) times daily.   ARIPiprazole 2 MG tablet Commonly known as: ABILIFY Take 2 mg by mouth at bedtime.   cephALEXin 500 MG capsule Commonly known as: KEFLEX Take 500 mg by mouth 2 (two) times daily.   cyanocobalamin 1000 MCG tablet Commonly known as: VITAMIN B12 Take 1 tablet (1,000 mcg total) by mouth daily.   Dexcom G6 Transmitter Misc Use as instructed to check blood sugar. Change every 90 days What changed:  how much to take how to take this when to take this   Dexcom G7 Sensor Misc Change sensor every 10 days What changed:  how much to take how to take this when to take this   Emgality 120 MG/ML Soaj Generic drug: Galcanezumab-gnlm INJECT ONCE A MONTH What changed:  how much to take how to take this when to take this   fluconazole 200 MG tablet Commonly known as: DIFLUCAN Take 1 tablet (200 mg total) by mouth daily for 4 days.   fluticasone-salmeterol 100-50 MCG/ACT Aepb Commonly known as: Advair Diskus Inhale 1 puff into the lungs 2 (two) times daily.   insulin lispro 100 UNIT/ML KwikPen Commonly known as: HumaLOG KwikPen Max daily 50 units What changed:  how much to take how to take this when to take this additional instructions   Insulin Pen Needle 29G X Misc 1 Device by Does not apply route daily in the afternoon.   Lantus SoloStar 100 UNIT/ML Solostar Pen Generic drug: insulin glargine Inject 25 Units into the skin 2 (two) times daily. What changed: how much to take   LORazepam 1 MG  tablet Commonly known as: ATIVAN Take 0.5 tablets (0.5 mg total) by mouth 2 (two) times daily.   metoprolol tartrate 50 MG tablet Commonly known as: LOPRESSOR Take 1 tablet (50 mg total) by mouth 2 (two) times daily.   morphine 15 MG 12 hr tablet Commonly known as: MS Contin Take 1 tablet (15 mg total) by mouth every 12 (twelve) hours. For chronic pain   Mounjaro 5 MG/0.5ML Pen Generic drug: tirzepatide Inject 5  mg into the skin once a week.   naloxone 4 MG/0.1ML Liqd nasal spray kit Commonly known as: NARCAN Place 1 spray into the nose once.   nystatin ointment Commonly known as: MYCOSTATIN Apply 1 Application topically 2 (two) times daily. What changed:  when to take this reasons to take this   nystatin powder Commonly known as: MYCOSTATIN/NYSTOP Apply 1 Application topically as needed (rash). What changed: Another medication with the same name was changed. Make sure you understand how and when to take each.   ondansetron 8 MG disintegrating tablet Commonly known as: ZOFRAN-ODT Take 1 tablet (8 mg total) by mouth every 6 (six) hours as needed for nausea or vomiting.   OXcarbazepine 150 MG tablet Commonly known as: TRILEPTAL Take 1 tablet (150 mg total) by mouth 2 (two) times daily.   pantoprazole 40 MG tablet Commonly known as: PROTONIX Take 1 tablet (40 mg total) by mouth daily at 12 noon.   pregabalin 50 MG capsule Commonly known as: LYRICA One  qAM with a 100 mg qpm wk 1. 1 BID, wk 2. Then 1 qpm wk 3. What changed:  how much to take how to take this when to take this   Prolia 60 MG/ML Sosy injection Generic drug: denosumab Inject 60 mg into the skin every 6 (six) months.   senna-docusate 8.6-50 MG tablet Commonly known as: Senokot-S Take 1 tablet by mouth at bedtime. What changed:  when to take this reasons to take this   TYLENOL PO Take 2 tablets by mouth as needed (pain/headache).   valACYclovir 1000 MG tablet Commonly known as: VALTREX Take  1 tablet (1,000 mg total) by mouth 3 (three) times daily.   Vitamin D (Cholecalciferol) 25 MCG (1000 UT) Tabs Take 1,000 Units by mouth daily in the afternoon.        Allergies  Allergen Reactions   Iodine Anaphylaxis   Ivp Dye [Iodinated Contrast Media] Anaphylaxis and Swelling    Throat closes    Latex Other (See Comments)    Latex tape pulls skin with it   Tizanidine Other (See Comments)    Weakness, goofy, bad dreams    The results of significant diagnostics from this hospitalization (including imaging, microbiology, ancillary and laboratory) are listed below for reference.    Microbiology: Recent Results (from the past 240 hour(s))  Urine Culture     Status: Abnormal   Collection Time: 11/09/22 10:37 AM   Specimen: Urine, Clean Catch  Result Value Ref Range Status   Specimen Description URINE, CLEAN CATCH  Final   Special Requests   Final    NONE Performed at [facility], [address], [place], [place] [address]    Culture 10,000 COLONIES/mL YEAST (A)  Final   Report Status 11/10/2022 FINAL  Final  C Difficile Quick Screen w PCR reflex     Status: None   Collection Time: 11/10/22  8:23 AM   Specimen: STOOL  Result Value Ref Range Status   C Diff antigen NEGATIVE NEGATIVE Final   C Diff toxin NEGATIVE NEGATIVE Final   C Diff interpretation No C. difficile detected.  Final    Comment: Performed at [facility], [address] [place], [place] [address]    Procedures/Studies: ECHOCARDIOGRAM LIMITED  Result Date: 11/10/2022    ECHOCARDIOGRAM REPORT   Patient Name:   Amber Stephenson Date of Exam: 11/10/2022 Medical Rec #:  [id number]         Height:  62.0 in Accession #:    [id number]        Weight:       190.0 lb Date of Birth:  April 25, 1946          BSA:          1.871 m Patient Age:    77 years          BP:           150/90 mmHg Patient Gender: F                 HR:           74 bpm. Exam Location:  Inpatient Procedure: Limited Echo, Color Doppler  and Cardiac Doppler Indications:    syncope  History:        Patient has prior history of Echocardiogram examinations, most                 recent 09/02/2022. CAD, Arrythmias:Atrial Fibrillation; Risk                 Factors:Sleep Apnea, Dyslipidemia and Diabetes.  Sonographer:    Delcie Roch RDCS Referring Phys: [id number] YNWGNFAOZ RATHORE  Sonographer Comments: Image acquisition challenging due to patient body habitus. IMPRESSIONS  1. Left ventricular ejection fraction, by estimation, is 65 to 70%. The left ventricle has normal function. The left ventricle has no regional wall motion abnormalities. There is mild concentric left ventricular hypertrophy. Left ventricular diastolic parameters are indeterminate.  2. Right ventricular systolic function is normal. The right ventricular size is normal.  3. Left atrial size was severely dilated.  4. Right atrial size was moderately dilated.  5. The mitral valve is degenerative. Trivial mitral valve regurgitation. Moderate to severe mitral annular calcification.  6. The aortic valve was not well visualized. Aortic valve regurgitation is mild. Aortic valve sclerosis/calcification is present, without any evidence of aortic stenosis.  7. The inferior vena cava is dilated in size with <50% respiratory variability, suggesting right atrial pressure of 15 mmHg. Comparison(s): No significant change from prior study. FINDINGS  Left Ventricle: Left ventricular ejection fraction, by estimation, is 65 to 70%. The left ventricle has normal function. The left ventricle has no regional wall motion abnormalities. The left ventricular internal cavity size was normal in size. There is  mild concentric left ventricular hypertrophy. Left ventricular diastolic parameters are indeterminate. Right Ventricle: The right ventricular size is normal. Right vetricular wall thickness was not well visualized. Right ventricular systolic function is normal. Left Atrium: Left atrial size was severely  dilated. Right Atrium: Right atrial size was moderately dilated. Pericardium: There is no evidence of pericardial effusion. Mitral Valve: The mitral valve is degenerative in appearance. There is moderate thickening of the mitral valve leaflet(s). There is mild calcification of the mitral valve leaflet(s). Moderate to severe mitral annular calcification. Trivial mitral valve regurgitation. MV peak gradient, 10.4 mmHg. The mean mitral valve gradient is 4.0 mmHg. Tricuspid Valve: The tricuspid valve is normal in structure. Tricuspid valve regurgitation is trivial. Aortic Valve: The aortic valve was not well visualized. Aortic valve regurgitation is mild. Aortic valve sclerosis/calcification is present, without any evidence of aortic stenosis. Pulmonic Valve: The pulmonic valve was not well visualized. Aorta: The aortic root is normal in size and structure. Venous: The inferior vena cava is dilated in size with less than 50% respiratory variability, suggesting right atrial pressure of 15 mmHg. IAS/Shunts: The atrial septum is grossly normal.  LEFT VENTRICLE PLAX 2D LVIDd:  4.70 cm LVIDs:         2.60 cm LV PW:         1.20 cm LV IVS:        1.10 cm  IVC IVC diam: 2.10 cm LEFT ATRIUM         Index LA diam:    4.40 cm 2.35 cm/m  AORTIC VALVE LVOT Vmax:   66.70 cm/s LVOT Vmean:  44.100 cm/s LVOT VTI:    0.126 m  AORTA Ao Asc diam: 3.50 cm MITRAL VALVE            TRICUSPID VALVE MV Peak grad: 10.4 mmHg TR Peak grad:   28.1 mmHg MV Mean grad: 4.0 mmHg  TR Vmax:        265.00 cm/s MV Vmax:      1.61 m/s MV Vmean:     89.3 cm/s SHUNTS                         Systemic VTI: 0.13 m Laurance Flatten MD Electronically signed by Laurance Flatten MD Signature Date/Time: 11/10/2022/7:47:25 PM    Final    CT HEAD WO CONTRAST ( )  Result Date: 11/09/2022 CLINICAL DATA:  For syncope/presyncope. EXAM: CT HEAD WITHOUT CONTRAST TECHNIQUE: Contiguous axial images were obtained from the base of the skull through the vertex  without intravenous contrast. RADIATION DOSE REDUCTION: This exam was performed according to the departmental dose-optimization program which includes automated exposure control, adjustment of the mA and/or kV according to patient size and/or use of iterative reconstruction technique. COMPARISON:  Prior study from earlier the same day. FINDINGS: Brain: Cerebral volume within normal limits. No acute intracranial hemorrhage. No acute large vessel territory infarct. No mass lesion, mass effect or midline shift. No hydrocephalus or extra-axial fluid collection. Vascular: No abnormal hyperdense vessel. Scattered vascular calcifications noted within the carotid siphons. Skull: Involving soft tissue hematoma at the scalp vertex. Calvarium intact without fracture. Sinuses/Orbits: Globes and orbital soft tissues within normal limits. Paranasal sinuses and mastoid air cells are clear. Other: None. IMPRESSION: 1. Stable head CT.  No acute intracranial abnormality. 2. Evolving soft tissue hematoma at the scalp vertex. No calvarial fracture. Electronically Signed   By: Rise Mu M.D.   On: 11/09/2022 22:58   DG Shoulder Right  Result Date: 11/09/2022 CLINICAL DATA:  Fall in bathroom this morning EXAM: RIGHT SHOULDER - 2+ VIEW COMPARISON:  08/19/2019 right shoulder radiographs FINDINGS: Status post right total shoulder arthroplasty with no evidence of hardware fracture or loosening. No glenohumeral dislocation. No osseous fracture. No evidence of acromioclavicular separation. No suspicious focal osseous lesions. No significant AC joint arthropathy. No pathologic soft tissue calcifications. IMPRESSION: No right shoulder dislocation or osseous fracture. Status post right total shoulder arthroplasty, with no evidence of hardware complication. Electronically Signed   By: Delbert Phenix M.D.   On: 11/09/2022 08:17   DG Shoulder Left  Result Date: 11/09/2022 CLINICAL DATA:  Fall in bathroom this morning EXAM: LEFT  SHOULDER - 2+ VIEW COMPARISON:  None Available. FINDINGS: No left shoulder fracture. No glenohumeral dislocation. No evidence of acromioclavicular separation. No significant AC joint arthropathy. Mild inferior glenohumeral osteoarthritis. No suspicious focal osseous lesions. No radiopaque foreign bodies or pathologic soft tissue calcifications. IMPRESSION: No left shoulder fracture or malalignment. Mild inferior glenohumeral osteoarthritis. Electronically Signed   By: Delbert Phenix M.D.   On: 11/09/2022 08:15   DG Chest Port 1 View  Result Date: 11/09/2022 CLINICAL DATA:  Fall this morning in bathroom EXAM: PORTABLE CHEST 1 VIEW COMPARISON:  09/01/2022 chest radiograph. FINDINGS: Partially visualized right shoulder arthroplasty. Stable cardiomediastinal silhouette with mild cardiomegaly. No pneumothorax. No pleural effusion. No overt pulmonary edema. No acute consolidative airspace disease. No displaced fractures in the visualized chest IMPRESSION: Mild cardiomegaly without overt pulmonary edema. No active pulmonary disease. Electronically Signed   By: Delbert Phenix M.D.   On: 11/09/2022 08:14   DG Pelvis Portable  Result Date: 11/09/2022 CLINICAL DATA:  Trauma EXAM: PORTABLE PELVIS 1-2 VIEWS COMPARISON:  None Available. FINDINGS: No pelvic fracture or diastasis. Mild lower lumbar degenerative changes. No suspicious focal osseous lesions. No significant hip arthropathy. No evidence of hip dislocation on this single frontal view. IMPRESSION: No pelvic fracture. Electronically Signed   By: Delbert Phenix M.D.   On: 11/09/2022 08:12   CT Head Wo Contrast  Result Date: 11/09/2022 CLINICAL DATA:  Level 2 trauma due to fall EXAM: CT HEAD WITHOUT CONTRAST CT CERVICAL SPINE WITHOUT CONTRAST TECHNIQUE: Multidetector CT imaging of the head and cervical spine was performed following the standard protocol without intravenous contrast. Multiplanar CT image reconstructions of the cervical spine were also generated. RADIATION  DOSE REDUCTION: This exam was performed according to the departmental dose-optimization program which includes automated exposure control, adjustment of the mA and/or kV according to patient size and/or use of iterative reconstruction technique. COMPARISON:  10/09/2022 FINDINGS: CT HEAD FINDINGS Brain: No evidence of acute infarction, hemorrhage, hydrocephalus, extra-axial collection or mass lesion/mass effect. Vascular: No hyperdense vessel or unexpected calcification. Skull: Scalp hematoma at the vertex.  No acute fracture Sinuses/Orbits: No evidence of injury CT CERVICAL SPINE FINDINGS Alignment: No traumatic malalignment Skull base and vertebrae: No acute fracture Soft tissues and spinal canal: No prevertebral fluid or swelling. No visible canal hematoma. Disc levels: Generalized disc space narrowing with endplate and facet spurring. Upper chest: Clear apical lungs IMPRESSION: 1. No evidence of acute intracranial or cervical spine injury. 2. Scalp hematoma without calvarial fracture. Electronically Signed   By: Tiburcio Pea M.D.   On: 11/09/2022 07:11   CT Cervical Spine Wo Contrast  Result Date: 11/09/2022 CLINICAL DATA:  Level 2 trauma due to fall EXAM: CT HEAD WITHOUT CONTRAST CT CERVICAL SPINE WITHOUT CONTRAST TECHNIQUE: Multidetector CT imaging of the head and cervical spine was performed following the standard protocol without intravenous contrast. Multiplanar CT image reconstructions of the cervical spine were also generated. RADIATION DOSE REDUCTION: This exam was performed according to the departmental dose-optimization program which includes automated exposure control, adjustment of the mA and/or kV according to patient size and/or use of iterative reconstruction technique. COMPARISON:  10/09/2022 FINDINGS: CT HEAD FINDINGS Brain: No evidence of acute infarction, hemorrhage, hydrocephalus, extra-axial collection or mass lesion/mass effect. Vascular: No hyperdense vessel or unexpected  calcification. Skull: Scalp hematoma at the vertex.  No acute fracture Sinuses/Orbits: No evidence of injury CT CERVICAL SPINE FINDINGS Alignment: No traumatic malalignment Skull base and vertebrae: No acute fracture Soft tissues and spinal canal: No prevertebral fluid or swelling. No visible canal hematoma. Disc levels: Generalized disc space narrowing with endplate and facet spurring. Upper chest: Clear apical lungs IMPRESSION: 1. No evidence of acute intracranial or cervical spine injury. 2. Scalp hematoma without calvarial fracture. Electronically Signed   By: Tiburcio Pea M.D.   On: 11/09/2022 07:11    Labs: BNP (last 3 results) Recent Labs    06/23/22 1549 07/02/22 1155 09/01/22 1857  BNP 306.7* 97.8 127.5*   Basic Metabolic Panel: Recent  Labs  Lab 11/09/22 0624 11/09/22 2246 11/10/22 0136  NA 131*  132* 133* 133*  K 4.5  4.4 4.4 4.0  CL 96*  98 98 100  CO2 22 24 23   GLUCOSE 366*  368* 328* 296*  BUN 24*  31* 21 19  CREATININE 1.06*  1.00 0.88 0.90  CALCIUM 9.5 9.0 8.9   Liver Function Tests: Recent Labs  Lab 11/09/22 0624  AST 22  ALT 13  ALKPHOS 106  BILITOT 0.9  PROT 6.8  ALBUMIN 3.3*   Recent Labs  Lab 11/09/22 0624 11/09/22 2246 11/10/22 0136  WBC 9.3 8.2 8.4  HGB 13.6  13.9 12.1 12.9  HCT 40.8  41.0 36.6 39.3  MCV 86.4 86.5 89.7  PLT 215 185 185   Recent Labs  Lab 11/10/22 1305 11/10/22 1653 11/10/22 2058 11/11/22 0641 11/11/22 1144  GLUCAP 287* 295* 230* 185* 288*   Recent Labs    11/09/22 1628  VITAMINB12 748   Urinalysis    Component Value Date/Time   COLORURINE YELLOW 11/09/2022 1015   APPEARANCEUR HAZY (A) 11/09/2022 1015   APPEARANCEUR Clear 03/26/2022 0815   LABSPEC 1.012 11/09/2022 1015   PHURINE 5.0 11/09/2022 1015   GLUCOSEU >=500 (A) 11/09/2022 1015   HGBUR LARGE (A) 11/09/2022 1015   BILIRUBINUR NEGATIVE 11/09/2022 1015   BILIRUBINUR Negative 03/26/2022 0815   KETONESUR NEGATIVE 11/09/2022 1015   PROTEINUR  30 (A) 11/09/2022 1015   UROBILINOGEN 0.2 07/23/2015 1350   NITRITE NEGATIVE 11/09/2022 1015   LEUKOCYTESUR MODERATE (A) 11/09/2022 1015   Sepsis Labs Recent Labs  Lab 11/09/22 0624 11/09/22 2246 11/10/22 0136  WBC 9.3 8.2 8.4   Microbiology Recent Results (from the past 240 hour(s))  Urine Culture     Status: Abnormal   Collection Time: 11/09/22 10:37 AM   Specimen: Urine, Clean Catch  Result Value Ref Range Status   Specimen Description URINE, CLEAN CATCH  Final   Special Requests   Final    NONE Performed at [facility], [address], [place], [place] [address]    Culture 10,000 COLONIES/mL YEAST (A)  Final   Report Status 11/10/2022 FINAL  Final  C Difficile Quick Screen w PCR reflex     Status: None   Collection Time: 11/10/22  8:23 AM   Specimen: STOOL  Result Value Ref Range Status   C Diff antigen NEGATIVE NEGATIVE Final   C Diff toxin NEGATIVE NEGATIVE Final   C Diff interpretation No C. difficile detected.  Final    Comment: Performed at [facility], [address] [place], [place] [address]   Time coordinating discharge: 25 minutes  SIGNED: Lanae Boast, MD  [facility] 11/11/2022, 1:46 PM  If 7PM-7AM, please contact night-coverage [email or web address]

## 2022-11-11 NOTE — Progress Notes (Signed)
   Durable Medical Equipment (From admission, onward)        Start     Ordered  11/11/22 1401  For home use only DME Walker rolling  Once      Question Answer Comment Walker: With 5 Inch Wheels  Patient needs a walker to treat with the following condition Gait abnormality    11/11/22 1400

## 2022-11-11 NOTE — Progress Notes (Signed)
Physical Therapy Treatment Patient Details Name: Amber Stephenson MRN: [id number] DOB: 11/20/1945 Today's Date: 11/11/2022   History of Present Illness The pt is a 77 yo female presenting 6/9 after a fall. No acute injury or findings on imaging. Pt with scalp hematoma, concern for UTI, syncopal symptoms during hospitalization. PMH includes: increased falls recently, GAD, HTN, HLD BPPV, CAD nonobstructive, CHF, chronic pain, PTSD diabetes mellitus with neuropathy, nonalcoholic fatty liver disease OSA using nocturnal oxygen/pulmonary hypertension, restless leg syndrome, and tremor.    PT Comments    Pt demonstrating good improvement.  She was able to increase gait to 200' and with improved safety.  Did need some cues for sequencing with transfers.  Spouse present and supportive.  Pt denied any dizziness with activity and orthostatic BP was negative.  She did have increase in HR to 142 bpm max with walking.  Pt has good support at home - updated recommendations to home with therapy in home setting.    Orthostatic BP: Supine 122/67 Sitting 111/72 Standing 126/78 And sitting post walk 147/85    Recommendations for follow up therapy are one component of a multi-disciplinary discharge planning process, led by the attending physician.  Recommendations may be updated based on patient status, additional functional criteria and insurance authorization.  Follow Up Recommendations  Can patient physically be transported by private vehicle: Yes    Assistance Recommended at Discharge Intermittent Supervision/Assistance  Patient can return home with the following Assistance with cooking/housework;Assist for transportation;Help with stairs or ramp for entrance;A little help with walking and/or transfers;A little help with bathing/dressing/bathroom   Equipment Recommendations  Rolling walker (2 wheels)    Recommendations for Other Services       Precautions / Restrictions Precautions Precautions:  Fall Restrictions Weight Bearing Restrictions: No     Mobility  Bed Mobility Overal bed mobility: Needs Assistance Bed Mobility: Supine to Sit, Sit to Supine     Supine to sit: Min assist Sit to supine: Supervision   General bed mobility comments: light min A to elevate trunk    Transfers Overall transfer level: Needs assistance Equipment used: Rolling walker (2 wheels) Transfers: Sit to/from Stand Sit to Stand: Min guard           General transfer comment: Min guard safety    Ambulation/Gait Ambulation/Gait assistance: Min guard Gait Distance (Feet): 200 Feet Assistive device: Rolling walker (2 wheels) Gait Pattern/deviations: Step-through pattern Gait velocity: decreased     General Gait Details: Good RW proximity and navigation; min guard safety   Stairs             Wheelchair Mobility    Modified Rankin (Stroke Patients Only)       Balance Overall balance assessment: Needs assistance Sitting-balance support: Feet supported, No upper extremity supported Sitting balance-Leahy Scale: Good     Standing balance support: Bilateral upper extremity supported, No upper extremity supported Standing balance-Leahy Scale: Fair Standing balance comment: RW to ambulate but could static stand without support                            Cognition Arousal/Alertness: Awake/alert Behavior During Therapy: WFL for tasks assessed/performed Overall Cognitive Status: Impaired/Different from baseline Area of Impairment: Problem solving                             Problem Solving: Difficulty sequencing, Decreased initiation General Comments: Requiring cues for initiation  and sequencing with supine to sit otherwise improved - good use of RW and navigating RW        Exercises      General Comments   Orthostatic BP: Supine 122/67 Sitting 111/72 Standing 126/78 And sitting post walk 147/85   HR 70's-80's rest and up to 130's but  maxed at 142 bpm briefly with walking; quickly returned to resting values with sitting.     Pertinent Vitals/Pain Pain Assessment Pain Assessment: Faces Faces Pain Scale: Hurts even more Pain Location: Hips Pain Descriptors / Indicators: Sore Pain Intervention(s): Limited activity within patient's tolerance, Monitored during session    Home Living                          Prior Function            PT Goals (current goals can now be found in the care plan section) Progress towards PT goals: Progressing toward goals    Frequency    Min 3X/week      PT Plan Discharge plan needs to be updated;Frequency needs to be updated    Co-evaluation              AM-PAC PT "6 Clicks" Mobility   Outcome Measure  Help needed turning from your back to your side while in a flat bed without using bedrails?: A Little Help needed moving from lying on your back to sitting on the side of a flat bed without using bedrails?: A Little Help needed moving to and from a bed to a chair (including a wheelchair)?: A Little Help needed standing up from a chair using your arms (e.g., wheelchair or bedside chair)?: A Little Help needed to walk in hospital room?: A Little Help needed climbing 3-5 steps with a railing? : A Little 6 Click Score: 18    End of Session Equipment Utilized During Treatment: Gait belt Activity Tolerance: Patient tolerated treatment well Patient left: in bed;with call bell/phone within reach;with bed alarm set Nurse Communication: Mobility status PT Visit Diagnosis: Unsteadiness on feet (R26.81);Other abnormalities of gait and mobility (R26.89);Repeated falls (R29.6);Muscle weakness (generalized) (M62.81)     Time: 1610-9604 PT Time Calculation (min) (ACUTE ONLY): 22 min  Charges:  $Gait Training: 8-22 mins                     Anise Salvo, PT Acute Rehab [facility] Rehab [phone]    Rayetta Humphrey 11/11/2022, 12:54 PM

## 2022-11-11 NOTE — Progress Notes (Signed)
D/C tele and IV. Went over AVS with pt and her husband and all questions were addressed.   Lawson Radar, RN

## 2022-11-11 NOTE — Consult Note (Addendum)
[facility] Customer service manager [facility]) Accountable Care Organization (ACO) [facility] Liaison Note  11/11/2022  Amber Stephenson 1945/08/05 [id number]  Location: [facility] RN Hospital Liaison met patient at bedside at [facility].  Insurance: MCR ACO   Amber Stephenson is a 77 y.o. female who is a Primary Care Patient of Stacks, Broadus John, MD ([facility]. The patient was screened for readmission hospitalization with noted extreme risk score for unplanned readmission risk with 4 IP/5 ED in 6 months.  The patient was assessed for potential [facility]) Care Management service needs for post hospital transition for care coordination. Review of patient's electronic medical record reveals patient was admitted with Frequent falls 2nd to Acute cystitis with hematuria. [facility] liaison met with pt and spouse at the bedside concerning [facility] services and available benefits. Offered a post hospital prevention [facility] care coordinator (receptive to this service). Reports [facility] will be involved for HHPT services. [facility] liaison collaborated with [facility] and comfirmed [facility] [name] has been requested. [facility] liaison will make a referral for [facility] services due to pt's risk of readmission. Patient was given an appointment reminder card and 24 hour Nurse Advice Line magnet.   Plan: [facility] Liaison will continue to follow progress and disposition to asess for post hospital community care coordination/management needs.  Referral request for community care coordination: pending disposition.   [facility] Care Management/Population Health does not replace or interfere with any arrangements made by the Inpatient Transition of Care team.   For questions contact:   Elliot Cousin, RN, BSN [facility] Liaison [place]   [facility]  Population Health Office Hours MTWF  8:00 am-6:00 pm Off on Thursday [phone] mobile [phone] [Office toll free  line][facility] Office Hours are M-F 8:30 - 5 pm 24 hour nurse advise line [phone] Concierge  [email or web address]

## 2022-11-11 NOTE — TOC Initial Note (Addendum)
Transition of Care [facility]) - Initial/Assessment Note    Patient Details  Name: Amber Stephenson MRN: [id number] Date of Birth: 03/26/1946  Transition of Care [facility]) CM/SW Contact:    Eduard Roux, LCSW Phone Number: 11/11/2022, 10:23 AM  Clinical Narrative:                  CSW met with patient at bedside , along with her spouse. CSW discussed therapy recommendations for short term rehab at [facility]. Patient declined SNF. Patient states she wants to d/c home. Patient spouse was present and he states he will be there with her. Both were agreeable to patient returning home.  MD/RN updated   Antony Blackbird, MSW, LCSW Clinical Social Worker    Expected Discharge Plan: Home w Home Health Services     Patient Goals and CMS Choice            Expected Discharge Plan and Services In-house Referral: Clinical Social Work     Living arrangements for the past 2 months: Single Family Home                                      Prior Living Arrangements/Services Living arrangements for the past 2 months: Single Family Home Lives with:: Self, Spouse          Need for Family Participation in Patient Care: Yes (Comment) Care giver support system in place?: Yes (comment)      Activities of Daily Living Home Assistive Devices/Equipment: Oxygen, Shower chair with back ADL Screening (condition at time of admission) Patient's cognitive ability adequate to safely complete daily activities?: Yes Is the patient deaf or have difficulty hearing?: No Does the patient have difficulty seeing, even when wearing glasses/contacts?: No Does the patient have difficulty concentrating, remembering, or making decisions?: Yes Patient able to express need for assistance with ADLs?: Yes Does the patient have difficulty dressing or bathing?: Yes Independently performs ADLs?: No Communication: Independent Dressing (OT): Needs assistance Is this a change from baseline?: Pre-admission  baseline Grooming: Needs assistance Is this a change from baseline?: Change from baseline, expected to last <3 days Feeding: Independent Bathing: Needs assistance Is this a change from baseline?: Change from baseline, expected to last <3 days Toileting: Needs assistance Is this a change from baseline?: Change from baseline, expected to last <3 days In/Out Bed: Needs assistance Is this a change from baseline?: Change from baseline, expected to last <3 days Walks in Home: Needs assistance Is this a change from baseline?: Change from baseline, expected to last <3 days Does the patient have difficulty walking or climbing stairs?: Yes Weakness of Legs: Both Weakness of Arms/Hands: None  Permission Sought/Granted                  Emotional Assessment Appearance:: Appears stated age Attitude/Demeanor/Rapport: Engaged Affect (typically observed): Appropriate Orientation: : Oriented to Self, Oriented to Place, Oriented to  Time, Oriented to Situation Alcohol / Substance Use: Not Applicable Psych Involvement: No (comment)  Admission diagnosis:  UTI (urinary tract infection) [N39.0] Acute cystitis with hematuria [N30.01] Fall, initial encounter [W19.XXXA] Patient Active Problem List   Diagnosis Date Noted   UTI (urinary tract infection) 11/09/2022   Vitamin B12 deficiency 11/09/2022   Scalp hematoma, initial encounter 11/09/2022   Frequent falls 09/01/2022   Hypotension 09/01/2022   Renal insufficiency 08/08/2022   Hemorrhoids 03/11/2022   Microalbuminuria 01/21/2022   Tremor,  essential 12/11/2021   Chronic right-sided low back pain with right-sided sciatica 07/19/2021   Sensory ataxia 03/28/2021   Chronic post-traumatic stress disorder (PTSD) 12/06/2020   Senile nuclear sclerosis 12/06/2020   Senile corneal changes 12/06/2020   Postmenopausal bleeding 12/06/2020   Acquired hammer toe 12/06/2020   Opioid dependence 12/06/2020   Hypermetropia 12/06/2020   Benzodiazepine  dependence 12/06/2020   Generalized anxiety disorder 12/06/2020   Functional diarrhea 10/26/2020   Type II diabetes mellitus (HCC) 09/24/2020   Chronic respiratory failure with hypoxia (HCC) 04/25/2020   Chronic constipation 04/25/2020   CAD (coronary artery disease)    Hypocalcemia    S/P shoulder replacement, 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   Mixed hyperlipidemia 05/20/2019   Vertigo 04/25/2019   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   OSA (obstructive sleep apnea) 02/24/2019   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   At risk for adverse drug event 07/06/2018   Non-alcoholic fatty liver disease 01/12/2018   Mild intermittent asthma 01/12/2018   Senile osteoporosis 12/15/2017   Migraine headache with aura 02/12/2016   Diabetic neuropathy (HCC) 02/06/2016   Bipolar 1 disorder (HCC) 10/04/2015   Major depressive disorder 10/03/2015   Herpes genitalis in women 07/16/2015   Long term current use of anticoagulant 05/30/2015   Chronic diastolic heart failure (HCC) 05/30/2015   Essential hypertension    RLS (restless legs syndrome) 04/27/2015   Insomnia 01/23/2015   Chronic atrial fibrillation 01/04/2015   PCP:  Mechele Claude, MD Pharmacy:   [facility] [id number] - [place], [place] - [address] AT [facility] [address] [place] [address] Phone: [phone] Fax: [phone]  [facility] - [place], [place] - [address] [place] [address] Phone: [phone] Fax: [phone]  [facility] [address] [place] [address] Phone: [phone] Fax: [phone]     Social Determinants of Health (SDOH) Social History: SDOH Screenings   Food Insecurity: Patient Declined (11/09/2022)  Housing: Patient Declined (11/09/2022)  Transportation Needs: Patient Declined (11/09/2022)  Utilities: Patient Declined (11/09/2022)  Alcohol Screen: Low Risk  (12/13/2021)   Depression (PHQ2-9): Low Risk  (10/22/2022)  Financial Resource Strain: Low Risk  (06/30/2022)  Physical Activity: Inactive (12/13/2021)  Social Connections: Socially Integrated (12/13/2021)  Stress: No Stress Concern Present (12/13/2021)  Tobacco Use: Low Risk  (11/09/2022)   SDOH Interventions:     Readmission Risk Interventions    06/29/2022   10:48 AM  Readmission Risk Prevention Plan  Transportation Screening Complete  HRI or Home Care Consult Complete  Social Work Consult for Recovery Care Planning/Counseling Complete  Palliative Care Screening Not Applicable  Medication Review Oceanographer) Complete

## 2022-11-11 NOTE — TOC Transition Note (Signed)
Transition of Care (TOC) - CM/SW Discharge Note Donn Pierini RN, BSN Transitions of Care Unit 4E- RN Case Manager See Treatment Team for direct phone #   Patient Details  Name: Amber Stephenson MRN: [id number] Date of Birth: 12-11-1945  Transition of Care [facility]) CM/SW Contact:  [name], RN Phone Number: 11/11/2022, 4:01 PM   Clinical Narrative:    Pt stable for transition home today, per CSW pt has refused SNF at this time and wants to return home w/ HH.   Pt has a hx with [facility] for [facility]. CM spoke with pt and spouse at bedside and confirmed [facility] choice for [facility].  Pt also voiced she needs RW for home- no preference for provider- agreeable to have delivered to room prior to discharge.   Call made to [employer] liaison for DME- RW to be delivered to room prior to discharge.   HH referral called to [facility]- referral has been accepted.   Spouse at bedside and will transport home once DME delivered.    Final next level of care: Home w Home Health Services Barriers to Discharge: No Barriers Identified   Patient Goals and CMS Choice CMS Medicare.gov Compare Post Acute Care list provided to:: Patient Choice offered to / list presented to : Patient, Spouse  Discharge Placement             Home w/ [facility]            Discharge Plan and Services Additional resources added to the After Visit Summary for   In-house Referral: Clinical Social Work Discharge Planning Services: CM Consult Post Acute Care Choice: Durable Medical Equipment, Home Health          DME Arranged: Walker rolling DME Agency: [facility] Date DME Agency Contacted: 11/11/22 Time DME Agency Contacted: [phone] Representative spoke with at DME Agency: Earna Coder HH Arranged: PT, OT, Nurse's Aide, Refused SNF [facility] Agency: [facility] Date [facility] Agency Contacted: 11/11/22 Time HH Agency Contacted: [phone] Representative spoke with at [facility] Agency: Kandee Keen  Social Determinants of Health (SDOH) Interventions SDOH  Screenings   Food Insecurity: Patient Declined (11/09/2022)  Housing: Patient Declined (11/09/2022)  Transportation Needs: Patient Declined (11/09/2022)  Utilities: Patient Declined (11/09/2022)  Alcohol Screen: Low Risk  (12/13/2021)  Depression (PHQ2-9): Low Risk  (10/22/2022)  Financial Resource Strain: Low Risk  (06/30/2022)  Physical Activity: Inactive (12/13/2021)  Social Connections: Socially Integrated (12/13/2021)  Stress: No Stress Concern Present (12/13/2021)  Tobacco Use: Low Risk  (11/09/2022)     Readmission Risk Interventions    11/11/2022    4:01 PM 06/29/2022   10:48 AM  Readmission Risk Prevention Plan  Transportation Screening Complete Complete  HRI or Home Care Consult  Complete  Social Work Consult for Recovery Care Planning/Counseling  Complete  Palliative Care Screening  Not Applicable  Medication Review Oceanographer) Complete Complete  HRI or Home Care Consult Complete   SW Recovery Care/Counseling Consult Complete   Palliative Care Screening Not Applicable   Skilled Nursing Facility Patient Refused

## 2022-11-12 ENCOUNTER — Telehealth: Payer: Self-pay | Admitting: *Deleted

## 2022-11-12 NOTE — Progress Notes (Signed)
  Care Coordination  Outreach Note  11/12/2022 Name: JALEXIS BREED MRN: [id number] DOB: April 22, 1946   Care Coordination Outreach Attempts: An unsuccessful telephone outreach was attempted today to offer the patient information about available care coordination services.  Follow Up Plan:  Additional outreach attempts will be made to offer the patient care coordination information and services.   Encounter Outcome:  No Answer  Christie Nottingham  Care Coordination Care Guide  Direct Dial: [phone]

## 2022-11-12 NOTE — Telephone Encounter (Signed)
Ok. Thank you.

## 2022-11-13 ENCOUNTER — Ambulatory Visit: Payer: Medicare Other | Admitting: Adult Health

## 2022-11-13 ENCOUNTER — Telehealth: Payer: Self-pay | Admitting: *Deleted

## 2022-11-13 ENCOUNTER — Ambulatory Visit (INDEPENDENT_AMBULATORY_CARE_PROVIDER_SITE_OTHER): Payer: Medicare Other | Admitting: Adult Health

## 2022-11-13 ENCOUNTER — Encounter: Payer: Self-pay | Admitting: Adult Health

## 2022-11-13 VITALS — BP 176/102 | HR 93 | Ht 62.0 in | Wt 193.0 lb

## 2022-11-13 DIAGNOSIS — N950 Postmenopausal bleeding: Secondary | ICD-10-CM | POA: Diagnosis not present

## 2022-11-13 DIAGNOSIS — I10 Essential (primary) hypertension: Secondary | ICD-10-CM

## 2022-11-13 DIAGNOSIS — B369 Superficial mycosis, unspecified: Secondary | ICD-10-CM | POA: Diagnosis not present

## 2022-11-13 DIAGNOSIS — R300 Dysuria: Secondary | ICD-10-CM

## 2022-11-13 DIAGNOSIS — N898 Other specified noninflammatory disorders of vagina: Secondary | ICD-10-CM

## 2022-11-13 DIAGNOSIS — L290 Pruritus ani: Secondary | ICD-10-CM | POA: Diagnosis not present

## 2022-11-13 LAB — POCT URINALYSIS DIPSTICK
Glucose, UA: POSITIVE — AB
Ketones, UA: NEGATIVE
Nitrite, UA: NEGATIVE
Protein, UA: POSITIVE — AB

## 2022-11-13 NOTE — Transitions of Care (Post Inpatient/ED Visit) (Signed)
11/13/2022  Name: Amber Stephenson MRN: [id number] DOB: May 15, 1946  Today's TOC FU Call Status: Today's TOC FU Call Status:: Successful TOC FU Call Competed TOC FU Call Complete Date: 11/13/22  Transition Care Management Follow-up Telephone Call Date of Discharge: 11/11/22 Discharge Facility: [facility]) Type of Discharge: Inpatient Admission Primary Inpatient Discharge Diagnosis:: Frequent falls How have you been since you were released from the hospital?: Better Any questions or concerns?: No  Items Reviewed: Did you receive and understand the discharge instructions provided?: Yes Medications obtained,verified, and reconciled?: Yes (Medications Reviewed) Any new allergies since your discharge?: No Dietary orders reviewed?: No Do you have support at home?: Yes People in Home: spouse Name of Support/Comfort Primary Source: Alfredo Bach  Medications Reviewed Today: Medications Reviewed Today     Reviewed by Luella Cook, RN (Case Manager) on 11/13/22 at 1038  Med List Status: <None>   Medication Order Taking? Sig Documenting Provider Last Dose Status Informant  Acetaminophen (TYLENOL PO) [id number] Yes Take 2 tablets by mouth as needed (pain/headache). [provider] Taking Active Spouse/Significant Other  albuterol (VENTOLIN HFA) 108 (90 Base) MCG/ACT inhaler [id number] Yes Inhale 2 puffs into the lungs every 6 (six) hours as needed for wheezing or shortness of breath. Sharee Holster, NP Taking Active Spouse/Significant Other  allopurinol (ZYLOPRIM) 100 MG tablet [id number] Yes Take 100 mg by mouth 2 (two) times daily. [provider] Taking Active Spouse/Significant Other  apixaban (ELIQUIS) 5 MG TABS tablet [id number] Yes Take 1 tablet (5 mg total) by mouth 2 (two) times daily. Quintella Reichert, MD Taking Active Spouse/Significant Other  ARIPiprazole (ABILIFY) 2 MG tablet [id number] Yes Take 2 mg by mouth at bedtime. [provider] Taking Active  Spouse/Significant Other  Continuous Blood Gluc Sensor (DEXCOM G7 SENSOR) MISC [id number]  Change sensor every 10 days  Patient taking differently: 1 each by Other route See admin instructions. Change sensor every 10 days   Shamleffer, Konrad Dolores, MD  Active Spouse/Significant Other  Continuous Blood Gluc Transmit (DEXCOM G6 TRANSMITTER) MISC [id number]  Use as instructed to check blood sugar. Change every 90 days  Patient taking differently: 1 each by Other route See admin instructions. Use as instructed to check blood sugar. Change every 90 days   Shamleffer, Konrad Dolores, MD  Active Spouse/Significant Other  cyanocobalamin (VITAMIN B12) 1000 MCG tablet [id number] Yes Take 1 tablet (1,000 mcg total) by mouth daily. Ghimire, Werner Lean, MD Taking Active Spouse/Significant Other  denosumab (PROLIA) 60 MG/ML SOSY injection [id number] Yes Inject 60 mg into the skin every 6 (six) months. Mechele Claude, MD Taking Active Spouse/Significant Other  fluconazole (DIFLUCAN) 200 MG tablet [id number] Yes Take 1 tablet (200 mg total) by mouth daily for 4 days. Lanae Boast, MD Taking Active   fluticasone-salmeterol (ADVAIR DISKUS) 100-50 MCG/ACT AEPB [id number] Yes Inhale 1 puff into the lungs 2 (two) times daily. Parrett, Virgel Bouquet, NP Taking Active Spouse/Significant Other  Galcanezumab-gnlm (EMGALITY) 120 MG/ML Ivory Broad [id number] Yes INJECT ONCE A MONTH  Patient taking differently: Inject 120 mg into the skin See admin instructions. INJECT ONCE A MONTH   Marcos Eke, [place] Taking Active Spouse/Significant Other  insulin glargine (LANTUS SOLOSTAR) 100 UNIT/ML Solostar Pen [id number] Yes Inject 25 Units into the skin 2 (two) times daily. Lanae Boast, MD Taking Active   insulin lispro (HUMALOG KWIKPEN) 100 UNIT/ML KwikPen [id number] Yes Max daily 50 units  Patient taking differently: Inject 4-8 Units into the skin 3 (three) times daily.   Shamleffer, [employer]  Gust Brooms, MD Taking Active Spouse/Significant Other  Insulin  Pen Needle 29G X MISC [id number] Yes 1 Device by Does not apply route daily in the afternoon. Shamleffer, Konrad Dolores, MD Taking Active Spouse/Significant Other  LORazepam (ATIVAN) 1 MG tablet [id number] Yes Take 0.5 tablets (0.5 mg total) by mouth 2 (two) times daily. Maretta Bees, MD Taking Active Spouse/Significant Other  metoprolol tartrate (LOPRESSOR) 50 MG tablet [id number] Yes Take 1 tablet (50 mg total) by mouth 2 (two) times daily. Quintella Reichert, MD Taking Active Spouse/Significant Other  morphine (MS CONTIN) 15 MG 12 hr tablet [id number] Yes Take 1 tablet (15 mg total) by mouth every 12 (twelve) hours. For chronic pain Lovorn, Megan, MD Taking Active Spouse/Significant Other  naloxone [facility]) nasal spray 4 mg/0.1 mL [id number] Yes Place 1 spray into the nose once. [provider] Taking Active Spouse/Significant Other  nystatin (MYCOSTATIN/NYSTOP) powder [id number] Yes Apply 1 Application topically as needed (rash). Shamleffer, Konrad Dolores, MD Taking Active Spouse/Significant Other  nystatin ointment (MYCOSTATIN) [id number] Yes Apply 1 Application topically 2 (two) times daily.  Patient taking differently: Apply 1 Application topically as needed (rash).   Adline Potter, NP Taking Active Spouse/Significant Other           Med Note (BRIDGES, JACQUELINE L   Tue Sep 09, 2022  1:09 PM)    ondansetron (ZOFRAN-ODT) 8 MG disintegrating tablet [id number] Yes Take 1 tablet (8 mg total) by mouth every 6 (six) hours as needed for nausea or vomiting. Mechele Claude, MD Taking Active Spouse/Significant Other           Med Note Henreitta Leber, JACQUELINE L   Tue Sep 09, 2022  1:09 PM)    OXcarbazepine (TRILEPTAL) 150 MG tablet [id number]  Take 1 tablet (150 mg total) by mouth 2 (two) times daily. Sheikh, Omair [place], DO  Active Spouse/Significant Other  pregabalin (LYRICA) 50 MG capsule [id number] Yes One  qAM with a 100 mg qpm wk 1. 1 BID, wk 2. Then 1 qpm wk 3.  Patient taking  differently: Take 50 mg by mouth See admin instructions. One  qAM with a 100 mg qpm wk 1. 1 BID, wk 2. Then 1 qpm wk 3.   Mechele Claude, MD Taking Active Spouse/Significant Other  senna-docusate (SENOKOT-S) 8.6-50 MG tablet [id number] Yes Take 1 tablet by mouth at bedtime.  Patient taking differently: Take 1 tablet by mouth as needed for moderate constipation.   Marguerita Merles [place], DO Taking Active Spouse/Significant Other  tirzepatide [facility]) 5 MG/0.5ML Pen [id number] Yes Inject 5 mg into the skin once a week. Shamleffer, Konrad Dolores, MD Taking Active   Vitamin D, Cholecalciferol, 25 MCG (1000 UT) TABS [id number] Yes Take 1,000 Units by mouth daily in the afternoon. [provider] Taking Active Spouse/Significant Other  Med List Note Benjamine Mola 08/29/22 1018): [employer] insurance             Home Care and Equipment/Supplies: Were Home Health Services Ordered?: Yes Name of Home Health Agency:: [facility] Has Agency set up a time to come to your home?: No EMR reviewed for Home Health Orders: Orders present/patient has not received call (refer to CM for follow-up) Any new equipment or medical supplies ordered?: Yes Name of Medical supply agency?: [facility] Were you able to get the equipment/medical supplies?: Yes Do you have any questions related to the use of the equipment/supplies?: No  Functional Questionnaire: Do you need assistance with bathing/showering or dressing?: Yes Do you need assistance with  meal preparation?: Yes Do you need assistance with eating?: No Do you have difficulty maintaining continence: No Do you need assistance with getting out of bed/getting out of a chair/moving?: No Do you have difficulty managing or taking your medications?: No  Follow up appointments reviewed: PCP Follow-up appointment confirmed?: Yes Date of PCP follow-up appointment?: 11/26/22 Follow-up Provider: [facility] Follow-up appointment  confirmed?: NA Do you need transportation to your follow-up appointment?: No Do you understand care options if your condition(s) worsen?: Yes-patient verbalized understanding  SDOH Interventions Today    Flowsheet Row Most Recent Value  SDOH Interventions   Food Insecurity Interventions Intervention Not Indicated  Housing Interventions Intervention Not Indicated  Transportation Interventions Intervention Not Indicated, Patient Resources (Friends/Family)      Interventions Today    Flowsheet Row Most Recent Value  General Interventions   General Interventions Discussed/Reviewed General Interventions Discussed, General Interventions Reviewed, Doctor Visits  Doctor Visits Discussed/Reviewed Doctor Visits Discussed, Doctor Visits Reviewed  Pharmacy Interventions   Pharmacy Dicussed/Reviewed Pharmacy Topics Discussed      TOC Interventions Today    Flowsheet Row Most Recent Value  TOC Interventions   TOC Interventions Discussed/Reviewed TOC Interventions Discussed, TOC Interventions Reviewed       Gean Maidens BSN RN [facility] [phone]

## 2022-11-13 NOTE — Progress Notes (Signed)
  Subjective:     Patient ID: Amber Stephenson, female   DOB: 1946-05-14, 77 y.o.   MRN: [id number]  HPI Amber Stephenson is a 77 year old white female, married, PM in complaining of vaginal bleeding several days before her fall, and she was in the hospital. She also complains of burning with urination and itching in vaginal area and rectal area. She has used nystatin powders.  She had diflucan in hospital, had yeast in urine.   PCP is Dr Amber Stephenson  Review of Systems Vaginal bleeding, none now Burning with urination Itching and vaginal and rectal area Reviewed past medical,surgical, social and family history. Reviewed medications and allergies.     Objective:   Physical Exam BP (!) 176/102 (BP Location: Right Arm, Patient Position: Sitting, Cuff Size: Normal)   Pulse 93   Ht 5\' 2"  (1.575 m)   Wt 193 lb (87.5 kg)   BMI 35.30 kg/m  urine dipstick +protein, trace blood and leuks and glucose 500.    Skin warm and dry.Pelvic: external genitalia is normal in appearance no lesions,+powders, vagina: pale, no blood seen,urethra has no lesions or masses noted, cervix:smooth, uterus: normal size, shape and contour, non tender, no masses felt, adnexa: no masses or tenderness noted. Bladder is non tender and no masses felt. Painted vulva,vagina and rectal area with gentian violet.  She has bruises on back from fall and hematoma top of head, left top eye lid swollen and lite erythema(she said it was like that this morning when she woke up and took a benadryl, will call PCP if not better by afternoon). Fall risk is high  [facility] - 11/13/22 0901       Pregnancy Intention Screening   Does the patient want to become pregnant in the next year? N/A    Does the patient's partner want to become pregnant in the next year? N/A    Would the patient like to discuss contraceptive options today? N/A      Contraception Wrap Up   Current Method No Method - Other Reason   postmenopausal   Reason for No Current Contraceptive  Method at Intake ([facility] Only) Other    End Method No Method - Other Reason   postmenopausal   Contraception Counseling Provided No            Examination chaperoned by Amber Mood LPN  Assessment:     1. Burning with urination UA C&S sent to rule out UTI  - POCT Urinalysis Dipstick - Urine Culture - Urinalysis, Routine w reflex microscopic  2. Vaginal itching Painted with gentian violet  3. Rectal itching Painted with gentian violet  4. Superficial fungus infection of skin Painted with gentian violet   5. PMB (postmenopausal bleeding) Had bleeding before fall, none now Will get pelvic US 11/25/22 at 3:30 pm at [facility] to assess uterine lining, if thickened will get endometrial biopsy  - US PELVIC COMPLETE WITH TRANSVAGINAL; Future  6. Essential hypertension Has recently changed BP meds Follow up with PCP     Plan:     Follow up  in 2 weeks to go over [place] and recheck vaginal area

## 2022-11-14 ENCOUNTER — Telehealth: Payer: Self-pay | Admitting: Family Medicine

## 2022-11-14 ENCOUNTER — Encounter: Payer: Self-pay | Admitting: Nurse Practitioner

## 2022-11-14 ENCOUNTER — Emergency Department (HOSPITAL_BASED_OUTPATIENT_CLINIC_OR_DEPARTMENT_OTHER)
Admission: EM | Admit: 2022-11-14 | Discharge: 2022-11-14 | Disposition: A | Discharge status: Home / Self Care | Payer: Medicare Other | Attending: Emergency Medicine | Admitting: Emergency Medicine

## 2022-11-14 ENCOUNTER — Ambulatory Visit (INDEPENDENT_AMBULATORY_CARE_PROVIDER_SITE_OTHER): Payer: Medicare Other | Admitting: Nurse Practitioner

## 2022-11-14 ENCOUNTER — Encounter (HOSPITAL_BASED_OUTPATIENT_CLINIC_OR_DEPARTMENT_OTHER): Payer: Self-pay

## 2022-11-14 ENCOUNTER — Other Ambulatory Visit: Payer: Self-pay

## 2022-11-14 ENCOUNTER — Emergency Department (HOSPITAL_BASED_OUTPATIENT_CLINIC_OR_DEPARTMENT_OTHER): Payer: Medicare Other

## 2022-11-14 VITALS — BP 147/85 | HR 82 | Temp 98.2°F | Resp 20 | Ht 62.0 in | Wt 192.0 lb

## 2022-11-14 DIAGNOSIS — S0512XA Contusion of eyeball and orbital tissues, left eye, initial encounter: Secondary | ICD-10-CM | POA: Diagnosis not present

## 2022-11-14 DIAGNOSIS — I2510 Atherosclerotic heart disease of native coronary artery without angina pectoris: Secondary | ICD-10-CM | POA: Diagnosis not present

## 2022-11-14 DIAGNOSIS — R519 Headache, unspecified: Secondary | ICD-10-CM | POA: Diagnosis not present

## 2022-11-14 DIAGNOSIS — R600 Localized edema: Secondary | ICD-10-CM

## 2022-11-14 DIAGNOSIS — T1490XA Injury, unspecified, initial encounter: Secondary | ICD-10-CM | POA: Diagnosis not present

## 2022-11-14 DIAGNOSIS — I509 Heart failure, unspecified: Secondary | ICD-10-CM | POA: Insufficient documentation

## 2022-11-14 DIAGNOSIS — G43E09 Chronic migraine with aura, not intractable, without status migrainosus: Secondary | ICD-10-CM | POA: Diagnosis not present

## 2022-11-14 DIAGNOSIS — I4891 Unspecified atrial fibrillation: Secondary | ICD-10-CM | POA: Diagnosis not present

## 2022-11-14 DIAGNOSIS — I110 Hypertensive heart disease with heart failure: Secondary | ICD-10-CM | POA: Insufficient documentation

## 2022-11-14 DIAGNOSIS — W19XXXA Unspecified fall, initial encounter: Secondary | ICD-10-CM | POA: Diagnosis not present

## 2022-11-14 DIAGNOSIS — G43709 Chronic migraine without aura, not intractable, without status migrainosus: Secondary | ICD-10-CM | POA: Diagnosis not present

## 2022-11-14 DIAGNOSIS — M542 Cervicalgia: Secondary | ICD-10-CM | POA: Diagnosis present

## 2022-11-14 DIAGNOSIS — S0003XA Contusion of scalp, initial encounter: Secondary | ICD-10-CM | POA: Diagnosis not present

## 2022-11-14 DIAGNOSIS — S1093XA Contusion of unspecified part of neck, initial encounter: Secondary | ICD-10-CM | POA: Diagnosis not present

## 2022-11-14 LAB — CBC WITH DIFFERENTIAL/PLATELET
Abs Immature Granulocytes: 0.07 10*3/uL (ref 0.00–0.07)
Basophils Absolute: 0 10*3/uL (ref 0.0–0.1)
Basophils Relative: 1 %
Eosinophils Absolute: 0.2 10*3/uL (ref 0.0–0.5)
Eosinophils Relative: 2 %
HCT: 34.5 % — ABNORMAL LOW (ref 36.0–46.0)
Hemoglobin: 11.5 g/dL — ABNORMAL LOW (ref 12.0–15.0)
Immature Granulocytes: 1 %
Lymphocytes Relative: 26 %
Lymphs Abs: 2.3 10*3/uL (ref 0.7–4.0)
MCH: 29.4 pg (ref 26.0–34.0)
MCHC: 33.3 g/dL (ref 30.0–36.0)
MCV: 88.2 fL (ref 80.0–100.0)
Monocytes Absolute: 0.6 10*3/uL (ref 0.1–1.0)
Monocytes Relative: 6 %
Neutro Abs: 5.7 10*3/uL (ref 1.7–7.7)
Neutrophils Relative %: 64 %
Platelets: 182 10*3/uL (ref 150–400)
RBC: 3.91 MIL/uL (ref 3.87–5.11)
RDW: 16 % — ABNORMAL HIGH (ref 11.5–15.5)
WBC: 8.8 10*3/uL (ref 4.0–10.5)
nRBC: 0.3 % — ABNORMAL HIGH (ref 0.0–0.2)

## 2022-11-14 LAB — URINALYSIS, ROUTINE W REFLEX MICROSCOPIC
Bilirubin, UA: NEGATIVE
Ketones, UA: NEGATIVE
Nitrite, UA: NEGATIVE
RBC, UA: NEGATIVE
Specific Gravity, UA: 1.014 (ref 1.005–1.030)
Urobilinogen, Ur: 0.2 mg/dL (ref 0.2–1.0)
pH, UA: 6 (ref 5.0–7.5)

## 2022-11-14 LAB — BASIC METABOLIC PANEL
Anion gap: 7 (ref 5–15)
BUN: 10 mg/dL (ref 8–23)
CO2: 22 mmol/L (ref 22–32)
Calcium: 9.3 mg/dL (ref 8.9–10.3)
Chloride: 105 mmol/L (ref 98–111)
Creatinine, Ser: 0.64 mg/dL (ref 0.44–1.00)
GFR, Estimated: >60 mL/min (ref >60)
Glucose, Bld: 192 mg/dL — ABNORMAL HIGH (ref 70–99)
Potassium: 4.4 mmol/L (ref 3.5–5.1)
Sodium: 134 mmol/L — ABNORMAL LOW (ref 135–145)

## 2022-11-14 LAB — MICROSCOPIC EXAMINATION
Bacteria, UA: NONE SEEN
Casts: NONE SEEN /lpf

## 2022-11-14 MED ORDER — ACETAMINOPHEN 325 MG PO TABS
650.0000 mg | ORAL_TABLET | Freq: Once | ORAL | Status: AC
  Administered 2022-11-14: 650 mg via ORAL
  Filled 2022-11-14: qty 2

## 2022-11-14 NOTE — ED Triage Notes (Signed)
Pt advises that she fell on Sunday, was admitted for same, discharged Tuesday. Woke up yesterday morning, today w increased swelling/ bruising from fall. Went to PCP today, advised to come to ED for repeat CT. Pt denies additional/ repeat trauma, no increase in pain

## 2022-11-14 NOTE — Discharge Instructions (Addendum)
It was a pleasure caring for you today. CT was not concerning for acute intracranial bleeding or fractures. Your headache, vision changes, and neck pain were relieved with Tylenol. The rest of your lab workup was reassuring.  Seek emergency care if experiencing any new or worsening symptoms such as loss of consciousness, facial drooping, severe fever.

## 2022-11-14 NOTE — Progress Notes (Signed)
Subjective:    Patient ID: Amber Stephenson, female    DOB: 08-Aug-1945, 77 y.o.   MRN: [id number]   Chief Complaint: Left eye swollen Larey Seat last weekend and was in hospital 3 days/)   Pt presents for swelling to L eye; states she was discharged from the hospital 3 days after being admitted for a fall; states she woke up yesterday morning and had some puffiness above her L eye that got a little worse throughout the day yesterday; woke up this morning with bruising around L eye and to L side of head and swelling around L eye was worse. She denies any injury since hospital discharge. Has headache but no different than it has been since she fell; no eye pain, blurred vision, double vision, N/V, dizziness.    Patient Active Problem List   Diagnosis Date Noted   PMB (postmenopausal bleeding) 11/13/2022   Superficial fungus infection of skin 11/13/2022   Burning with urination 11/13/2022   UTI (urinary tract infection) 11/09/2022   Vitamin B12 deficiency 11/09/2022   Scalp hematoma, initial encounter 11/09/2022   Frequent falls 09/01/2022   Hypotension 09/01/2022   Renal insufficiency 08/08/2022   Hemorrhoids 03/11/2022   Rectal itching 03/11/2022   Microalbuminuria 01/21/2022   Tremor, essential 12/11/2021   Chronic right-sided low back pain with right-sided sciatica 07/19/2021   Vaginal itching 04/16/2021   Sensory ataxia 03/28/2021   Chronic post-traumatic stress disorder (PTSD) 12/06/2020   Senile nuclear sclerosis 12/06/2020   Senile corneal changes 12/06/2020   Postmenopausal bleeding 12/06/2020   Acquired hammer toe 12/06/2020   Opioid dependence 12/06/2020   Hypermetropia 12/06/2020   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   Generalized anxiety disorder 12/06/2020   Functional diarrhea 10/26/2020   Type II diabetes mellitus (HCC) 09/24/2020   Chronic respiratory failure with hypoxia (HCC) 04/25/2020   Chronic constipation 04/25/2020   CAD (coronary artery disease)     Hypocalcemia    S/P shoulder replacement, 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   Mixed hyperlipidemia 05/20/2019   Vertigo 04/25/2019   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   OSA (obstructive sleep apnea) 02/24/2019   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   At risk for adverse drug event 07/06/2018   Non-alcoholic fatty liver disease 01/12/2018   Mild intermittent asthma 01/12/2018   Senile osteoporosis 12/15/2017   Migraine headache with aura 02/12/2016   Diabetic neuropathy (HCC) 02/06/2016   Bipolar 1 disorder (HCC) 10/04/2015   Major depressive disorder 10/03/2015   Herpes genitalis in women 07/16/2015   Long term current use of anticoagulant 05/30/2015   Chronic diastolic heart failure (HCC) 05/30/2015   Essential hypertension    RLS (restless legs syndrome) 04/27/2015   Insomnia 01/23/2015   Chronic atrial fibrillation 01/04/2015       Review of Systems  Constitutional:  Negative for diaphoresis and fatigue.  HENT:  Negative for ear discharge, nosebleeds and rhinorrhea.   Eyes:  Positive for redness. Negative for photophobia, pain, discharge and visual disturbance.       Swelling and bruising around L eye  Respiratory:  Negative for shortness of breath.   Cardiovascular:  Negative for chest pain.  Gastrointestinal:  Negative for nausea and vomiting.  Neurological:  Positive for headaches. Negative for dizziness, seizures, syncope, weakness and light-headedness.       States no different than it has been since her fall  Hematological:  Bruises/bleeds easily.       On eliquis  Psychiatric/Behavioral:  Negative for confusion.   All other systems reviewed and are  negative.      Objective:   Physical Exam Vitals and nursing note reviewed.  Constitutional:      General: She is not in acute distress.    Appearance: Normal appearance. She is not ill-appearing.  HENT:     Head: Left periorbital erythema present.     Comments: Large hematoma to top of head (pt and husband  state it looks better than it did)    Right Ear: Tympanic membrane normal.     Left Ear: Tympanic membrane normal.     Nose: Nose normal.  Eyes:     General: Gaze aligned appropriately.     Extraocular Movements: Extraocular movements intact.     Conjunctiva/sclera:     Left eye: Hemorrhage present.     Pupils: Pupils are equal, round, and reactive to light.  Cardiovascular:     Rate and Rhythm: Normal rate and regular rhythm.     Pulses: Normal pulses.     Heart sounds: Normal heart sounds.  Pulmonary:     Effort: Pulmonary effort is normal. No respiratory distress.     Breath sounds: Normal breath sounds. No wheezing, rhonchi or rales.  Skin:    General: Skin is warm and dry.     Findings: Bruising present.     Comments: Bruising noted around L eye and temporal region of L side of face.  Neurological:     General: No focal deficit present.     Mental Status: She is alert and oriented to person, place, and time.     GCS: GCS eye subscore is 4. GCS verbal subscore is 5. GCS motor subscore is 6.  Psychiatric:        Mood and Affect: Mood normal.        Behavior: Behavior normal.       BP (!) 147/85   Pulse 82   Temp 98.2 F (36.8 C) (Temporal)   Resp 20   Ht 5\' 2"  (1.575 m)   Wt 192 lb (87.1 kg)   SpO2 94%   BMI 35.12 kg/m      Assessment & Plan:   Amber Stephenson in today with chief complaint of Left eye swollen (Fell last weekend and was in hospital 3 days/)   1. Periorbital edema of left eye 2. Periorbital contusion of left eye, initial encounter To ED needs repeat CT scan of head    The above assessment and management plan was discussed with the patient. The patient verbalized understanding of and has agreed to the management plan. Patient is aware to call the clinic if symptoms persist or worsen. Patient is aware when to return to the clinic for a follow-up visit. Patient educated on when it is appropriate to go to the emergency department.    Mary-Margaret Daphine Deutscher, FNP

## 2022-11-14 NOTE — Telephone Encounter (Signed)
Appt made

## 2022-11-14 NOTE — ED Provider Notes (Addendum)
[place] EMERGENCY DEPARTMENT AT [facility] Provider Note   CSN: [id number] Arrival date & time: 11/14/22  1511     History  Chief Complaint  Patient presents with   Paulino Rily is a 77 y.o. female with PMHx CHF, DM, HLD, Afib on Eliquis, migraine w/ aura, CAD, HTN who presents to ED complaining of neck pain, headache, and blurry left eye vision since this morning. Patient was admitted on 6/9 after a fall and head trauma and received an extensive workup which was not concerning for intracranial bleeding or fractures. Patient found to have UTI in hospital, was treated appropriately, and discharged on 6/11.  Denies fevers, chest pain, dyspnea, abdominal pain, nausea, vomiting. Denies loss of consciousness, seizures.   Fall Associated symptoms include headaches.       Home Medications Prior to Admission medications   Medication Sig Start Date End Date Taking? Authorizing Provider  Acetaminophen (TYLENOL PO) Take 2 tablets by mouth as needed (pain/headache).    [provider]  albuterol (VENTOLIN HFA) 108 (90 Base) MCG/ACT inhaler Inhale 2 puffs into the lungs every 6 (six) hours as needed for wheezing or shortness of breath. 08/09/20   Sharee Holster, NP  allopurinol (ZYLOPRIM) 100 MG tablet Take 100 mg by mouth 2 (two) times daily. 10/22/22   [provider]  apixaban (ELIQUIS) 5 MG TABS tablet Take 1 tablet (5 mg total) by mouth 2 (two) times daily. 09/09/22   Quintella Reichert, MD  ARIPiprazole (ABILIFY) 2 MG tablet Take 2 mg by mouth at bedtime. 10/07/21   [provider]  Continuous Blood Gluc Sensor (DEXCOM G7 SENSOR) MISC Change sensor every 10 days Patient taking differently: 1 each by Other route See admin instructions. Change sensor every 10 days 05/20/22   Shamleffer, Konrad Dolores, MD  Continuous Blood Gluc Transmit (DEXCOM G6 TRANSMITTER) MISC Use as instructed to check blood sugar. Change every 90 days Patient taking  differently: 1 each by Other route See admin instructions. Use as instructed to check blood sugar. Change every 90 days 05/12/22   Shamleffer, Konrad Dolores, MD  cyanocobalamin (VITAMIN B12) 1000 MCG tablet Take 1 tablet (1,000 mcg total) by mouth daily. 09/04/22   Ghimire, Werner Lean, MD  denosumab (PROLIA) 60 MG/ML SOSY injection Inject 60 mg into the skin every 6 (six) months. 08/14/20   Mechele Claude, MD  fluconazole (DIFLUCAN) 200 MG tablet Take 1 tablet (200 mg total) by mouth daily for 4 days. 11/11/22 11/15/22  Lanae Boast, MD  fluticasone-salmeterol (ADVAIR DISKUS) 100-50 MCG/ACT AEPB Inhale 1 puff into the lungs 2 (two) times daily. 09/11/22   Parrett, Virgel Bouquet, NP  Galcanezumab-gnlm (EMGALITY) 120 MG/ML SOAJ INJECT ONCE A MONTH Patient taking differently: Inject 120 mg into the skin See admin instructions. INJECT ONCE A MONTH 10/31/22   Marcos Eke, PA-C  insulin glargine (LANTUS SOLOSTAR) 100 UNIT/ML Solostar Pen Inject 25 Units into the skin 2 (two) times daily. 11/11/22   Lanae Boast, MD  insulin lispro (HUMALOG KWIKPEN) 100 UNIT/ML KwikPen Max daily 50 units Patient taking differently: Inject 4-8 Units into the skin 3 (three) times daily. 05/28/22   Shamleffer, Konrad Dolores, MD  Insulin Pen Needle 29G X MISC 1 Device by Does not apply route daily in the afternoon. 05/28/22   Shamleffer, Konrad Dolores, MD  LORazepam (ATIVAN) 1 MG tablet Take 0.5 tablets (0.5 mg total) by mouth 2 (two) times daily. 09/04/22   Ghimire, Werner Lean, MD  metoprolol tartrate (LOPRESSOR) 50 MG tablet Take 1 tablet (50 mg total) by mouth 2 (two) times daily. 09/09/22   Quintella Reichert, MD  morphine (MS CONTIN) 15 MG 12 hr tablet Take 1 tablet (15 mg total) by mouth every 12 (twelve) hours. For chronic pain 10/21/22   Lovorn, Aundra Millet, MD  naloxone [facility]) nasal spray 4 mg/0.1 mL Place 1 spray into the nose once. 08/08/21   [provider]  nystatin (MYCOSTATIN/NYSTOP) powder Apply 1 Application topically as  needed (rash). 06/16/22   Shamleffer, Konrad Dolores, MD  nystatin ointment (MYCOSTATIN) Apply 1 Application topically 2 (two) times daily. Patient taking differently: Apply 1 Application topically as needed (rash). 12/27/21   Adline Potter, NP  ondansetron (ZOFRAN-ODT) 8 MG disintegrating tablet Take 1 tablet (8 mg total) by mouth every 6 (six) hours as needed for nausea or vomiting. 01/13/22   Mechele Claude, MD  OXcarbazepine (TRILEPTAL) 150 MG tablet Take 1 tablet (150 mg total) by mouth 2 (two) times daily. 06/13/22   Marguerita Merles Latif, DO  pregabalin (LYRICA) 50 MG capsule One  qAM with a 100 mg qpm wk 1. 1 BID, wk 2. Then 1 qpm wk 3. Patient taking differently: Take 50 mg by mouth See admin instructions. One  qAM with a 100 mg qpm wk 1. 1 BID, wk 2. Then 1 qpm wk 3. 10/08/22   Mechele Claude, MD  senna-docusate (SENOKOT-S) 8.6-50 MG tablet Take 1 tablet by mouth at bedtime. Patient taking differently: Take 1 tablet by mouth as needed for moderate constipation. 06/13/22   Sheikh, Omair Latif, DO  tirzepatide [facility]) 5 MG/0.5ML Pen Inject 5 mg into the skin once a week. 11/11/22   Shamleffer, Konrad Dolores, MD  Vitamin D, Cholecalciferol, 25 MCG (1000 UT) TABS Take 1,000 Units by mouth daily in the afternoon. 07/26/20   [provider]      Allergies    Iodine, Ivp dye [iodinated contrast media], Latex, and Tizanidine    Review of Systems   Review of Systems  Neurological:  Positive for headaches.    Physical Exam Updated Vital Signs BP (!) 146/89 (BP Location: Right Arm)   Pulse 71   Temp 98 F (36.7 C) (Oral)   Resp 16   SpO2 92%  Physical Exam Vitals and nursing note reviewed.  Constitutional:      General: She is not in acute distress.    Appearance: She is not ill-appearing or toxic-appearing.  HENT:     Head: Normocephalic and atraumatic.     Mouth/Throat:     Mouth: Mucous membranes are moist.     Pharynx: No oropharyngeal exudate or posterior  oropharyngeal erythema.  Eyes:     General: No scleral icterus.       Right eye: No discharge.        Left eye: No discharge.     Extraocular Movements: Extraocular movements intact.     Conjunctiva/sclera: Conjunctivae normal.     Pupils: Pupils are equal, round, and reactive to light.     Comments: Peripheral vision intact  Cardiovascular:     Rate and Rhythm: Normal rate and regular rhythm.     Pulses: Normal pulses.     Heart sounds: Normal heart sounds. No murmur heard. Pulmonary:     Effort: Pulmonary effort is normal. No respiratory distress.     Breath sounds: Normal breath sounds. No wheezing, rhonchi or rales.  Musculoskeletal:     Right lower leg: No edema.  Left lower leg: No edema.  Skin:    General: Skin is warm and dry.     Findings: No rash.  Neurological:     General: No focal deficit present.     Mental Status: She is alert and oriented to person, place, and time. Mental status is at baseline.     Cranial Nerves: No cranial nerve deficit.     Sensory: No sensory deficit.     Motor: No weakness.     Comments: GCS 15. Speech is goal oriented. No deficits appreciated to CN III-XII; symmetric eyebrow raise, no facial drooping, tongue midline. Patient has equal grip strength bilaterally with 5/5 strength against resistance in all major muscle groups bilaterally. Sensation to light touch intact. Patient moves extremities without ataxia. Normal finger-nose-finger.   Psychiatric:        Mood and Affect: Mood normal.        Behavior: Behavior normal.     ED Results / Procedures / Treatments   Labs (all labs ordered are listed, but only abnormal results are displayed) Labs Reviewed  BASIC METABOLIC PANEL - Abnormal; Notable for the following components:      Result Value   Sodium 134 (*)    Glucose, Bld 192 (*)    All other components within normal limits  CBC WITH DIFFERENTIAL/PLATELET - Abnormal; Notable for the following components:   Hemoglobin 11.5 (*)     HCT 34.5 (*)    RDW 16.0 (*)    nRBC 0.3 (*)    All other components within normal limits    EKG None  Radiology CT Maxillofacial WO CM  Result Date: 11/14/2022 CLINICAL DATA:  Trauma. EXAM: CT MAXILLOFACIAL WITHOUT CONTRAST TECHNIQUE: Multidetector CT imaging of the maxillofacial structures was performed. Multiplanar CT image reconstructions were also generated. RADIATION DOSE REDUCTION: This exam was performed according to the departmental dose-optimization program which includes automated exposure control, adjustment of the mA and/or kV according to patient size and/or use of iterative reconstruction technique. COMPARISON:  Head CT dated 11/14/2022. FINDINGS: Osseous: No acute fracture.  No mandibular subluxation. Orbits: The globes and retro-orbital fat are preserved. Sinuses: Clear. Soft tissues: Mild subcutaneous edema of the left face. No hematoma or fluid collection. Limited intracranial: No significant or unexpected finding. IMPRESSION: No acute facial bone fractures. Electronically Signed   By: Elgie Collard M.D.   On: 11/14/2022 17:52   CT Cervical Spine Wo Contrast  Result Date: 11/14/2022 CLINICAL DATA:  Neck trauma. Fell on Sunday. Increased swelling and bruising today. EXAM: CT CERVICAL SPINE WITHOUT CONTRAST TECHNIQUE: Multidetector CT imaging of the cervical spine was performed without intravenous contrast. Multiplanar CT image reconstructions were also generated. RADIATION DOSE REDUCTION: This exam was performed according to the departmental dose-optimization program which includes automated exposure control, adjustment of the mA and/or kV according to patient size and/or use of iterative reconstruction technique. COMPARISON:  11/09/2022 and 10/09/2022 FINDINGS: Alignment: Straightening of usual cervical lordosis without anterior subluxation. Alignment is unchanged since prior studies, likely positional but could indicate muscle spasm. Normal alignment of the posterior elements.  Skull base and vertebrae: Skull base appears intact. No vertebral compression deformities. No focal bone lesion or bone destruction. Bone cortex appears intact. Soft tissues and spinal canal: No prevertebral soft tissue swelling. No abnormal paraspinal soft tissue mass or infiltration. Disc levels: Degenerative changes throughout with disc space narrowing and associated endplate osteophyte formation. Upper chest: Lung apices are clear. Other: None. IMPRESSION: 1. Nonspecific straightening of the usual cervical lordosis  without change since prior study. No acute displaced fractures are identified. 2. Diffuse degenerative changes throughout the cervical spine. Electronically Signed   By: Burman Nieves M.D.   On: 11/14/2022 17:50   CT Head Wo Contrast  Result Date: 11/14/2022 CLINICAL DATA:  Poly trauma, blunt EXAM: CT HEAD WITHOUT CONTRAST TECHNIQUE: Contiguous axial images were obtained from the base of the skull through the vertex without intravenous contrast. RADIATION DOSE REDUCTION: This exam was performed according to the departmental dose-optimization program which includes automated exposure control, adjustment of the mA and/or kV according to patient size and/or use of iterative reconstruction technique. COMPARISON:  CT head 11/09/2022 at 6:49 a.m. and 11/09/2022 at 10:09 p.m. FINDINGS: Brain: Diffuse cerebral atrophy. Ventricular dilatation consistent with central atrophy. Low-attenuation changes in the deep white matter consistent with small vessel ischemia. No abnormal extra-axial fluid collections. No mass effect or midline shift. Gray-white matter junctions are distinct. Basal cisterns are not effaced. No acute intracranial hemorrhage. Vascular: No hyperdense vessel or unexpected calcification. Skull: Calvarium appears intact. There is a moderate-sized subcutaneous scalp hematoma over the vertex. Sinuses/Orbits: Paranasal sinuses and mastoid air cells are clear. Congenital nonunion of the  posterior arch of C1. Other: None. IMPRESSION: 1. No acute intracranial abnormalities. Chronic atrophy and small vessel ischemic changes. 2. Subcutaneous scalp hematoma over the vertex. No underlying skull fractures. Electronically Signed   By: Burman Nieves M.D.   On: 11/14/2022 16:12    Procedures Procedures    Medications Ordered in ED Medications  acetaminophen (TYLENOL) tablet 650 mg (650 mg Oral Given 11/14/22 1755)    ED Course/ Medical Decision Making/ A&P                             Medical Decision Making Amount and/or Complexity of Data Reviewed Labs: ordered. Radiology: ordered.  Risk OTC drugs.   This patient presents to the ED after a fall, this involves an extensive number of treatment options, and is a complaint that carries with it a high risk of complications and morbidity.  The differential diagnosis includes  intracranial hemorrhage, subdural/epidural hematoma, vertebral fracture, spinal cord injury, muscle strain, skull fracture, fracture.   Co morbidities that complicate the patient evaluation  CHF, DM, HLD, Afib on Eliquis, migraine w/ aura, CAD, HTN     Lab Tests:  I Ordered, and personally interpreted labs.  The pertinent results include:   -CBC: mild anemia; no leukocytosis -BMP: mild hyponatremia; glucose (192)   Imaging Studies ordered:  I ordered imaging studies including  -CT head: No acute intracranial abnormalities. Chronic atrophy and small vessel ischemic changes. Subcutaneous scalp hematoma over the vertex. No underlying skull fractures. -CT maxillofacial: No acute facial bone fractures.  -CT cervical spine: Nonspecific straightening of the usual cervical lordosis without change since prior study. No acute displaced fractures are identified. Diffuse degenerative changes throughout the cervical spine. I independently visualized and interpreted imaging I agree with the radiologist interpretation     Problem List / ED Course /  Critical interventions / Medication management  Patient presented for headache, neck pain and vision changes. Patient stating that these exact symptoms occur whenever she has a migraine with aura. Since patient is on blood thinners and reports increased bruising of left eye without any further head trauma since Sunday, I proceeded with CT imaging. CT imaging without concern for intracranial bleeding or fractures. Patient with stable vitals and does not appear to be in distress. Patient  had an unremarkable physical/neuro exam. Patient's gross vision and peripheral vision intact bilaterally. Eyes PERRL and EOM. Mild bruising around left eye. Gave patient tylenol for migraine - which she states is what she usually takes for migraine with aura. Patient endorses that headache, neck pain, and vision changes resolved with Tylenol 650mg  that she was given in the ED. I shared with patient that I believe she was having a migraine with aura today in which she agreed stating that she feels better now. Patient currently without symptoms Patient afebrile with stable vitals and states that she is ready to go home.  I have reviewed the patients home medicines and have made adjustments as needed Patient was given return precautions. Patient stable for discharge at this time. Patient verbalized understanding of plan.   DDx: These are considered less likely due to history of present illness and physical exam findings -Intracranial hemorrhage, subdural/epidural hematoma: CT without concern no neurodeficits -Vertebral fracture: no midline tenderness, no step-off/crepitus/abnormalities palpated -Spinal cord injury: CT without concern, no neurodeficits -Skull fracture: No postauricular ecchymosis, no periorbital ecchymosis, no hemotympanum -Fracture: No step-offs/crepitus/abnormalities palpated in head, neck, chest, upper extremities, lower extremities, pelvis  Risk Stratification Score:  Nexus C-spine: 0 [place]  CT: 0   Social Determinants of Health:  none             Final Clinical Impression(s) / ED Diagnoses Final diagnoses:  Chronic migraine with aura without status migrainosus, not intractable    Rx / DC Orders ED Discharge Orders     None         Dorthy Cooler, [place] 11/14/22 1911    Dorthy Cooler, PA-C 11/14/22 1930    Vanetta Mulders, MD 11/17/22 1215

## 2022-11-14 NOTE — Telephone Encounter (Signed)
Pt called stating that she was recently discharged from the hospital and was told by the doctor there that she needs to be seen right away because her left eye is swollen and bruised. I explained to her that she is scheduled for a HFU but its not until next week. Pt says she is aware of that appt but feels that she needs to be seen today.

## 2022-11-15 DIAGNOSIS — E669 Obesity, unspecified: Secondary | ICD-10-CM | POA: Diagnosis not present

## 2022-11-15 DIAGNOSIS — E538 Deficiency of other specified B group vitamins: Secondary | ICD-10-CM | POA: Diagnosis not present

## 2022-11-15 DIAGNOSIS — Z7985 Long-term (current) use of injectable non-insulin antidiabetic drugs: Secondary | ICD-10-CM | POA: Diagnosis not present

## 2022-11-15 DIAGNOSIS — Z87440 Personal history of urinary (tract) infections: Secondary | ICD-10-CM | POA: Diagnosis not present

## 2022-11-15 DIAGNOSIS — Z96611 Presence of right artificial shoulder joint: Secondary | ICD-10-CM | POA: Diagnosis not present

## 2022-11-15 DIAGNOSIS — I509 Heart failure, unspecified: Secondary | ICD-10-CM | POA: Diagnosis not present

## 2022-11-15 DIAGNOSIS — M19012 Primary osteoarthritis, left shoulder: Secondary | ICD-10-CM | POA: Diagnosis not present

## 2022-11-15 DIAGNOSIS — I482 Chronic atrial fibrillation, unspecified: Secondary | ICD-10-CM | POA: Diagnosis not present

## 2022-11-15 DIAGNOSIS — Z794 Long term (current) use of insulin: Secondary | ICD-10-CM | POA: Diagnosis not present

## 2022-11-15 DIAGNOSIS — M109 Gout, unspecified: Secondary | ICD-10-CM | POA: Diagnosis not present

## 2022-11-15 DIAGNOSIS — Z79899 Other long term (current) drug therapy: Secondary | ICD-10-CM | POA: Diagnosis not present

## 2022-11-15 DIAGNOSIS — I083 Combined rheumatic disorders of mitral, aortic and tricuspid valves: Secondary | ICD-10-CM | POA: Diagnosis not present

## 2022-11-15 DIAGNOSIS — I2510 Atherosclerotic heart disease of native coronary artery without angina pectoris: Secondary | ICD-10-CM | POA: Diagnosis not present

## 2022-11-15 DIAGNOSIS — Z9981 Dependence on supplemental oxygen: Secondary | ICD-10-CM | POA: Diagnosis not present

## 2022-11-15 DIAGNOSIS — I110 Hypertensive heart disease with heart failure: Secondary | ICD-10-CM | POA: Diagnosis not present

## 2022-11-15 DIAGNOSIS — F319 Bipolar disorder, unspecified: Secondary | ICD-10-CM | POA: Diagnosis not present

## 2022-11-15 DIAGNOSIS — M5441 Lumbago with sciatica, right side: Secondary | ICD-10-CM | POA: Diagnosis not present

## 2022-11-15 DIAGNOSIS — Z6834 Body mass index (BMI) 34.0-34.9, adult: Secondary | ICD-10-CM | POA: Diagnosis not present

## 2022-11-15 DIAGNOSIS — E1140 Type 2 diabetes mellitus with diabetic neuropathy, unspecified: Secondary | ICD-10-CM | POA: Diagnosis not present

## 2022-11-15 DIAGNOSIS — E785 Hyperlipidemia, unspecified: Secondary | ICD-10-CM | POA: Diagnosis not present

## 2022-11-15 DIAGNOSIS — Z7901 Long term (current) use of anticoagulants: Secondary | ICD-10-CM | POA: Diagnosis not present

## 2022-11-15 DIAGNOSIS — G8929 Other chronic pain: Secondary | ICD-10-CM | POA: Diagnosis not present

## 2022-11-15 DIAGNOSIS — Z7951 Long term (current) use of inhaled steroids: Secondary | ICD-10-CM | POA: Diagnosis not present

## 2022-11-15 DIAGNOSIS — S0003XD Contusion of scalp, subsequent encounter: Secondary | ICD-10-CM | POA: Diagnosis not present

## 2022-11-15 DIAGNOSIS — Z86711 Personal history of pulmonary embolism: Secondary | ICD-10-CM | POA: Diagnosis not present

## 2022-11-15 LAB — URINE CULTURE

## 2022-11-17 NOTE — Progress Notes (Signed)
  Care Coordination   Note   11/17/2022 Name: Amber Stephenson MRN: [id number] DOB: 09-19-1945  Amber Stephenson is a 77 y.o. year old female who sees Stacks, Broadus John, MD for primary care. I reached out to [name] by phone today to offer care coordination services.  Amber Stephenson was given information about Care Coordination services today including:   The Care Coordination services include support from the care team which includes your Nurse Coordinator, Clinical Social Worker, or Pharmacist.  The Care Coordination team is here to help remove barriers to the health concerns and goals most important to you. Care Coordination services are voluntary, and the patient may decline or stop services at any time by request to their care team member.   Care Coordination Consent Status: Patient agreed to services and verbal consent obtained.   Follow up plan:  Telephone appointment with care coordination team member scheduled for:  11/20/22  Encounter Outcome:  Pt. Scheduled  [facility] Coordination Care Guide  Direct Dial: [phone]

## 2022-11-18 ENCOUNTER — Other Ambulatory Visit: Payer: Self-pay | Admitting: Family Medicine

## 2022-11-18 DIAGNOSIS — I5032 Chronic diastolic (congestive) heart failure: Secondary | ICD-10-CM

## 2022-11-18 DIAGNOSIS — M79676 Pain in unspecified toe(s): Secondary | ICD-10-CM | POA: Diagnosis not present

## 2022-11-18 DIAGNOSIS — E782 Mixed hyperlipidemia: Secondary | ICD-10-CM

## 2022-11-18 DIAGNOSIS — I4821 Permanent atrial fibrillation: Secondary | ICD-10-CM

## 2022-11-18 DIAGNOSIS — M2042 Other hammer toe(s) (acquired), left foot: Secondary | ICD-10-CM | POA: Diagnosis not present

## 2022-11-19 ENCOUNTER — Other Ambulatory Visit: Payer: Self-pay

## 2022-11-19 DIAGNOSIS — M109 Gout, unspecified: Secondary | ICD-10-CM | POA: Diagnosis not present

## 2022-11-19 MED ORDER — MORPHINE SULFATE ER 15 MG PO TBCR: 15.0000 mg | EXTENDED_RELEASE_TABLET | Freq: Two times a day (BID) | ORAL | 0 refills | Status: DC

## 2022-11-19 NOTE — Telephone Encounter (Signed)
Refill request

## 2022-11-20 ENCOUNTER — Ambulatory Visit: Payer: Self-pay | Admitting: *Deleted

## 2022-11-20 NOTE — Patient Outreach (Signed)
Care Coordination   Follow Up Visit Note   03/19/2023 update entry for 11/20/22 Name: Amber Stephenson MRN: [id number] DOB: [birth year]  Amber Stephenson is a 77 y.o. year old female who sees Stacks, Broadus John, MD for primary care. I spoke with  [name] by phone today.  What matters to the patients health and wellness today?  Fall, Reviewed 11/14/22 ED visit for fall, chronic migraine/headaches    Goals Addressed             This Visit's Progress    11/20/22 Management of Fall, Reviewed 11/14/22 ED visit for fall, chronic migraine/headaches  - nurse Care Coordination       Interventions Today    Flowsheet Row Most Recent Value  Chronic Disease   Chronic disease during today's visit Other  [Fall, Reviewed 11/14/22 ED visit for fall, chronic migraine/headaches]  General Interventions   General Interventions Discussed/Reviewed General Interventions Reviewed, Doctor Visits, Sick Day Rules  Doctor Visits Discussed/Reviewed Doctor Visits Reviewed, PCP  PCP/Specialist Visits Compliance with follow-up visit  Education Interventions   Education Provided Provided Education  Provided Verbal Education On Sick Day Rules, Medication  Pharmacy Interventions   Pharmacy Dicussed/Reviewed Pharmacy Topics Reviewed, Medications and their functions  Safety Interventions   Safety Discussed/Reviewed Safety Reviewed, Fall Risk              SDOH assessments and interventions completed:  No     Care Coordination Interventions:  Yes, provided   Follow up plan: Follow up call scheduled for 02/13/23    Encounter Outcome:  No Answer    [name], RN, BSN, CCM [facility] Care Management Community Coordinator Office number [phone]

## 2022-11-21 DIAGNOSIS — I509 Heart failure, unspecified: Secondary | ICD-10-CM | POA: Diagnosis not present

## 2022-11-21 DIAGNOSIS — S0003XD Contusion of scalp, subsequent encounter: Secondary | ICD-10-CM | POA: Diagnosis not present

## 2022-11-21 DIAGNOSIS — E1140 Type 2 diabetes mellitus with diabetic neuropathy, unspecified: Secondary | ICD-10-CM | POA: Diagnosis not present

## 2022-11-21 DIAGNOSIS — F319 Bipolar disorder, unspecified: Secondary | ICD-10-CM | POA: Diagnosis not present

## 2022-11-21 DIAGNOSIS — I482 Chronic atrial fibrillation, unspecified: Secondary | ICD-10-CM | POA: Diagnosis not present

## 2022-11-21 DIAGNOSIS — I110 Hypertensive heart disease with heart failure: Secondary | ICD-10-CM | POA: Diagnosis not present

## 2022-11-24 ENCOUNTER — Telehealth: Payer: Self-pay | Admitting: Family Medicine

## 2022-11-24 ENCOUNTER — Ambulatory Visit (INDEPENDENT_AMBULATORY_CARE_PROVIDER_SITE_OTHER): Payer: Medicare Other | Admitting: Adult Health

## 2022-11-24 ENCOUNTER — Ambulatory Visit (INDEPENDENT_AMBULATORY_CARE_PROVIDER_SITE_OTHER): Payer: Medicare Other

## 2022-11-24 ENCOUNTER — Encounter: Payer: Self-pay | Admitting: Adult Health

## 2022-11-24 VITALS — BP 111/70 | HR 108 | Ht 62.0 in | Wt 193.0 lb

## 2022-11-24 DIAGNOSIS — E1140 Type 2 diabetes mellitus with diabetic neuropathy, unspecified: Secondary | ICD-10-CM | POA: Diagnosis not present

## 2022-11-24 DIAGNOSIS — M5441 Lumbago with sciatica, right side: Secondary | ICD-10-CM

## 2022-11-24 DIAGNOSIS — N898 Other specified noninflammatory disorders of vagina: Secondary | ICD-10-CM

## 2022-11-24 DIAGNOSIS — B369 Superficial mycosis, unspecified: Secondary | ICD-10-CM

## 2022-11-24 DIAGNOSIS — I083 Combined rheumatic disorders of mitral, aortic and tricuspid valves: Secondary | ICD-10-CM | POA: Diagnosis not present

## 2022-11-24 DIAGNOSIS — M19012 Primary osteoarthritis, left shoulder: Secondary | ICD-10-CM | POA: Diagnosis not present

## 2022-11-24 DIAGNOSIS — I482 Chronic atrial fibrillation, unspecified: Secondary | ICD-10-CM

## 2022-11-24 DIAGNOSIS — S0003XD Contusion of scalp, subsequent encounter: Secondary | ICD-10-CM | POA: Diagnosis not present

## 2022-11-24 DIAGNOSIS — I110 Hypertensive heart disease with heart failure: Secondary | ICD-10-CM | POA: Diagnosis not present

## 2022-11-24 DIAGNOSIS — E538 Deficiency of other specified B group vitamins: Secondary | ICD-10-CM | POA: Diagnosis not present

## 2022-11-24 DIAGNOSIS — F319 Bipolar disorder, unspecified: Secondary | ICD-10-CM | POA: Diagnosis not present

## 2022-11-24 DIAGNOSIS — I2510 Atherosclerotic heart disease of native coronary artery without angina pectoris: Secondary | ICD-10-CM

## 2022-11-24 DIAGNOSIS — I509 Heart failure, unspecified: Secondary | ICD-10-CM | POA: Diagnosis not present

## 2022-11-24 DIAGNOSIS — G8929 Other chronic pain: Secondary | ICD-10-CM

## 2022-11-24 NOTE — Progress Notes (Signed)
  Subjective:     Patient ID: Amber Stephenson, female   DOB: Aug 27, 1945, 77 y.o.   MRN: [id number]  HPI Amber Stephenson is a 36 year old white female, married, PM in complaining of itching and burning in vaginal area. She was painted with gentian violet 11/13/22.  PCP is Dr Darlyn Read.  Review of Systems +itching and burning in vaginal area Has noticed discharge  Reviewed past medical,surgical, social and family history. Reviewed medications and allergies.     Objective:   Physical Exam BP 111/70 (BP Location: Right Arm, Patient Position: Sitting, Cuff Size: Normal)   Pulse (!) 108   Ht 5\' 2"  (1.575 m)   Wt 193 lb (87.5 kg)   BMI 35.30 kg/m   Skin warm and dry.Pelvic: external genitalia is normal in appearance,slightly red, vagina: pale, no discharge,urethra has no lesions or masses noted, cervix is poorly visualized, uterus: normal size, shape and contour, non tender, no masses felt, adnexa: no masses or tenderness noted. Bladder is non tender and no masses felt. Painted vulva, groin and vagina with gentian violet.  Examination chaperoned by Faith Rogue LPN    Assessment:     1. Vaginal itching Painted with gentian violet  2. Superficial fungus infection of skin Painted with gentian violet     Plan:    She has [place] 11/25/22 Follow up as scheduled

## 2022-11-24 NOTE — Telephone Encounter (Signed)
Patient is due for Prolia and said she does not have the medication. Please order and call back when it is here

## 2022-11-25 ENCOUNTER — Ambulatory Visit (HOSPITAL_COMMUNITY)
Admission: RE | Admit: 2022-11-25 | Discharge: 2022-11-25 | Disposition: A | Discharge status: Home / Self Care | Payer: Medicare Other | Source: Ambulatory Visit | Attending: Adult Health | Admitting: Adult Health

## 2022-11-25 ENCOUNTER — Telehealth: Payer: Self-pay

## 2022-11-25 ENCOUNTER — Other Ambulatory Visit: Payer: Self-pay | Admitting: Adult Health

## 2022-11-25 DIAGNOSIS — I10 Essential (primary) hypertension: Secondary | ICD-10-CM

## 2022-11-25 DIAGNOSIS — N950 Postmenopausal bleeding: Secondary | ICD-10-CM

## 2022-11-25 DIAGNOSIS — B369 Superficial mycosis, unspecified: Secondary | ICD-10-CM

## 2022-11-25 DIAGNOSIS — L290 Pruritus ani: Secondary | ICD-10-CM

## 2022-11-25 DIAGNOSIS — N898 Other specified noninflammatory disorders of vagina: Secondary | ICD-10-CM

## 2022-11-25 DIAGNOSIS — R300 Dysuria: Secondary | ICD-10-CM

## 2022-11-25 NOTE — Telephone Encounter (Signed)
Prolia VOB initiated via [email or web address]  Last Prolia inj: 05/29/22 Next Prolia inj DUE: 11/26/22 

## 2022-11-25 NOTE — Telephone Encounter (Signed)
Created new encounter for Prolia BIV. Will route encounter back once benefit verification is complete.  

## 2022-11-26 ENCOUNTER — Ambulatory Visit (INDEPENDENT_AMBULATORY_CARE_PROVIDER_SITE_OTHER): Payer: Medicare Other | Admitting: Family Medicine

## 2022-11-26 ENCOUNTER — Encounter: Payer: Self-pay | Admitting: Family Medicine

## 2022-11-26 ENCOUNTER — Ambulatory Visit (INDEPENDENT_AMBULATORY_CARE_PROVIDER_SITE_OTHER): Payer: Medicare Other

## 2022-11-26 VITALS — BP 111/68 | HR 74 | Temp 98.0°F | Ht 62.0 in | Wt 188.6 lb

## 2022-11-26 DIAGNOSIS — S299XXD Unspecified injury of thorax, subsequent encounter: Secondary | ICD-10-CM | POA: Diagnosis not present

## 2022-11-26 DIAGNOSIS — I517 Cardiomegaly: Secondary | ICD-10-CM | POA: Diagnosis not present

## 2022-11-26 DIAGNOSIS — F319 Bipolar disorder, unspecified: Secondary | ICD-10-CM | POA: Diagnosis not present

## 2022-11-26 DIAGNOSIS — S0003XD Contusion of scalp, subsequent encounter: Secondary | ICD-10-CM | POA: Diagnosis not present

## 2022-11-26 DIAGNOSIS — I509 Heart failure, unspecified: Secondary | ICD-10-CM

## 2022-11-26 DIAGNOSIS — M7731 Calcaneal spur, right foot: Secondary | ICD-10-CM | POA: Diagnosis not present

## 2022-11-26 DIAGNOSIS — R300 Dysuria: Secondary | ICD-10-CM

## 2022-11-26 DIAGNOSIS — Z96611 Presence of right artificial shoulder joint: Secondary | ICD-10-CM | POA: Diagnosis not present

## 2022-11-26 DIAGNOSIS — M25571 Pain in right ankle and joints of right foot: Secondary | ICD-10-CM | POA: Diagnosis not present

## 2022-11-26 DIAGNOSIS — E1140 Type 2 diabetes mellitus with diabetic neuropathy, unspecified: Secondary | ICD-10-CM | POA: Diagnosis not present

## 2022-11-26 DIAGNOSIS — I482 Chronic atrial fibrillation, unspecified: Secondary | ICD-10-CM | POA: Diagnosis not present

## 2022-11-26 DIAGNOSIS — I110 Hypertensive heart disease with heart failure: Secondary | ICD-10-CM | POA: Diagnosis not present

## 2022-11-26 LAB — URINALYSIS
Bilirubin, UA: NEGATIVE
Ketones, UA: NEGATIVE
Nitrite, UA: NEGATIVE
Protein,UA: NEGATIVE
RBC, UA: NEGATIVE
Specific Gravity, UA: 1.01 (ref 1.005–1.030)
Urobilinogen, Ur: 0.2 mg/dL (ref 0.2–1.0)
pH, UA: 6 (ref 5.0–7.5)

## 2022-11-26 MED ORDER — LISINOPRIL-HYDROCHLOROTHIAZIDE 10-12.5 MG PO TABS: 1.0000 | ORAL_TABLET | Freq: Every day | ORAL | 3 refills | Status: DC

## 2022-11-26 NOTE — Progress Notes (Signed)
Subjective:  Patient ID: Amber Stephenson, female    DOB: [birth year]  Age: 77 y.o. MRN: [id number]  CC: Hospitalization Follow-up   HPI Amber Stephenson presents for recent hospitalization r[facility] HAS BEEN HURTING SINCE THE FALL THAT LED TO Amber Stephenson HOSPITALIZATION. CHF sx have improved since hospital DC. She is still having some urinary frequency.      11/26/2022    3:09 PM 11/14/2022    2:22 PM 10/22/2022    8:20 AM  Depression screen PHQ 2/9  Decreased Interest 0 0 0  Down, Depressed, Hopeless 0 0 0  PHQ - 2 Score 0 0 0  Altered sleeping  0 0  Tired, decreased energy  0 0  Change in appetite  0 0  Feeling bad or failure about yourself   0 0  Trouble concentrating  0 0  Moving slowly or fidgety/restless  0 0  Suicidal thoughts  0 0  PHQ-9 Score  0 0  Difficult doing work/chores  Not difficult at all Not difficult at all    History Amber Stephenson has a past medical history of Acquired hammer toe (12/06/2020), Acute metabolic encephalopathy (06/27/2022), Allergic rhinitis (02/24/2019), Ambulatory dysfunction (04/23/2020), Atrial fibrillation with RVR (HCC) (05/19/2017), Back pain/sacroiliitis--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), Benign paroxysmal positional vertigo due to bilateral vestibular disorder (05/20/2019), Bipolar 1 disorder (HCC) (01/23/2015), BPPV (benign paroxysmal positional vertigo) (05/20/2019), CAD (coronary artery disease), Cardiomegaly (01/12/2018), CHF (congestive heart failure) (06/08/2022), Chronic back pain (01/04/2015), Chronic constipation (04/25/2020), Chronic diastolic heart failure (HCC) (78/29/5621), Chronic pain syndrome (08/22/2019), Chronic pain syndrome (08/22/2019), Chronic post-traumatic stress disorder (PTSD) (12/06/2020), Diabetic neuropathy (HCC) (02/06/2016), Dyslipidemia (09/24/2020), Dyspnea on exertion, Essential hypertension, Family history of coronary arteriosclerosis (05/30/2015), Functional  diarrhea (10/26/2020), Head trauma (09/17/2020), Hemorrhoids (03/11/2022), Herpes genitalis in women (07/16/2015), History of adenomatous polyp of colon (05/21/2019), Insomnia (01/23/2015), Lipoma (02/08/2015), Migraine headache with aura (02/12/2016), Myofascial pain dysfunction syndrome (08/22/2019), Myofascial pain dysfunction syndrome (08/22/2019), Non-alcoholic fatty liver disease (30/86/5784), Opioid dependence (HCC) (12/06/2020), OSA (obstructive sleep apnea) (02/24/2019), Osteopenia (12/06/2020), Pinguecula (12/06/2020), Postcoital bleeding (12/06/2020), Presbyopia (12/06/2020), Pulmonary hypertension, RLS (restless legs syndrome) (04/27/2015), Superficial fungus infection of skin (09/03/2021), Tremor, essential (12/11/2021), and Type II diabetes mellitus (HCC) (09/24/2020).   She has a past surgical history that includes THIGH SURGERY; Shoulder surgery (Right); Breast reduction surgery; Eye surgery (Right); Hammer toe surgery; LEFT HEART CATH AND CORONARY ANGIOGRAPHY (N/A, 02/03/2018); and Reverse shoulder arthroplasty (Right, 08/19/2019).   Amber Stephenson family history includes Alcohol abuse in Amber Stephenson sister; Alzheimer's disease in Amber Stephenson father; Diabetes in Amber Stephenson brother, mother, and sister; Heart disease in Amber Stephenson brother, mother, and sister; Mental illness in Amber Stephenson brother; Stroke in Amber Stephenson brother.She reports that she has never smoked. She has never used smokeless tobacco. She reports that she does not currently use alcohol. She reports that she does not use drugs.    ROS Review of Systems  Constitutional: Negative.   HENT: Negative.    Eyes:  Negative for visual disturbance.  Respiratory:  Negative for shortness of breath.   Cardiovascular:  Negative for chest pain.  Gastrointestinal:  Negative for abdominal pain.  Musculoskeletal:  Negative for arthralgias.    Objective:  BP 111/68   Pulse 74   Temp 98 F (36.7 C)   Ht 5\' 2"  (1.575 m)   Wt 188 lb 9.6 oz (85.5 kg)   SpO2 95%   BMI 34.50 kg/m   BP  Readings from Last 3  Encounters:  11/26/22 111/68  11/24/22 111/70  11/14/22 (!) 146/89    Wt Readings from Last 3 Encounters:  11/26/22 188 lb 9.6 oz (85.5 kg)  11/24/22 193 lb (87.5 kg)  11/14/22 192 lb (87.1 kg)     Physical Exam Constitutional:      General: She is not in acute distress.    Appearance: She is well-developed.  Cardiovascular:     Rate and Rhythm: Normal rate and regular rhythm.  Pulmonary:     Breath sounds: Normal breath sounds.  Musculoskeletal:        General: Normal range of motion.  Skin:    General: Skin is warm and dry.  Neurological:     Mental Status: She is alert and oriented to person, place, and time.       Assessment & Plan:   Amber Stephenson was seen today for hospitalization follow-up.  Diagnoses and all orders for this visit:  Acute right ankle pain -     DG Ankle Complete Right; Future  Chronic congestive heart failure, unspecified heart failure type (HCC) -     CBC with Differential/Platelet -     CMP14+EGFR -     DG Chest 2 View; Future  Dysuria -     Urinalysis -     Urine Culture  Other orders -     lisinopril-hydrochlorothiazide (ZESTORETIC) 10-12.5 MG tablet; Take 1 tablet by mouth daily. For blood pressure       I am having Amber Stephenson. Jardin start on lisinopril-hydrochlorothiazide. I am also having Amber Stephenson maintain Amber Stephenson Vitamin D (Cholecalciferol), albuterol, Prolia, naloxone, ARIPiprazole, nystatin ointment, ondansetron, Dexcom G6 Transmitter, Dexcom G7 Sensor, insulin lispro, Insulin Pen Needle, Acetaminophen (TYLENOL PO), senna-docusate, OXcarbazepine, nystatin, LORazepam, cyanocobalamin, metoprolol tartrate, apixaban, fluticasone-salmeterol, pregabalin, Emgality, allopurinol, Mounjaro, Lantus SoloStar, and morphine.  Allergies as of 11/26/2022       Reactions   Iodine Anaphylaxis   Ivp Dye [iodinated Contrast Media] Anaphylaxis, Swelling   Throat closes   Latex Other (See Comments)   Latex tape pulls skin with it    Tizanidine Other (See Comments)   Weakness, goofy, bad dreams        Medication List        Accurate as of November 26, 2022 11:59 PM. If you have any questions, ask your nurse or doctor.          albuterol 108 (90 Base) MCG/ACT inhaler Commonly known as: VENTOLIN HFA Inhale 2 puffs into the lungs every 6 (six) hours as needed for wheezing or shortness of breath.   allopurinol 100 MG tablet Commonly known as: ZYLOPRIM Take 100 mg by mouth 2 (two) times daily.   apixaban 5 MG Tabs tablet Commonly known as: Eliquis Take 1 tablet (5 mg total) by mouth 2 (two) times daily.   ARIPiprazole 2 MG tablet Commonly known as: ABILIFY Take 2 mg by mouth at bedtime.   cyanocobalamin 1000 MCG tablet Commonly known as: VITAMIN B12 Take 1 tablet (1,000 mcg total) by mouth daily.   Dexcom G6 Transmitter Misc Use as instructed to check blood sugar. Change every 90 days What changed:  how much to take how to take this when to take this   Dexcom G7 Sensor Misc Change sensor every 10 days What changed:  how much to take how to take this when to take this   Emgality 120 MG/ML Soaj Generic drug: Galcanezumab-gnlm INJECT ONCE A MONTH What changed:  how much to take how to take this when to  take this   fluticasone-salmeterol 100-50 MCG/ACT Aepb Commonly known as: Advair Diskus Inhale 1 puff into the lungs 2 (two) times daily.   insulin lispro 100 UNIT/ML KwikPen Commonly known as: HumaLOG KwikPen Max daily 50 units What changed:  how much to take how to take this when to take this additional instructions   Insulin Pen Needle 29G X Misc 1 Device by Does not apply route daily in the afternoon.   Lantus SoloStar 100 UNIT/ML Solostar Pen Generic drug: insulin glargine Inject 25 Units into the skin 2 (two) times daily.   lisinopril-hydrochlorothiazide 10-12.5 MG tablet Commonly known as: ZESTORETIC Take 1 tablet by mouth daily. For blood pressure Started by: Mechele Claude, MD   LORazepam 1 MG tablet Commonly known as: ATIVAN Take 0.5 tablets (0.5 mg total) by mouth 2 (two) times daily.   metoprolol tartrate 50 MG tablet Commonly known as: LOPRESSOR Take 1 tablet (50 mg total) by mouth 2 (two) times daily.   morphine 15 MG 12 hr tablet Commonly known as: MS Contin Take 1 tablet (15 mg total) by mouth every 12 (twelve) hours. For chronic pain   Mounjaro 5 MG/0.5ML Pen Generic drug: tirzepatide Inject 5 mg into the skin once a week.   naloxone 4 MG/0.1ML Liqd nasal spray kit Commonly known as: NARCAN Place 1 spray into the nose once.   nystatin ointment Commonly known as: MYCOSTATIN Apply 1 Application topically 2 (two) times daily. What changed:  when to take this reasons to take this   nystatin powder Commonly known as: MYCOSTATIN/NYSTOP Apply 1 Application topically as needed (rash). What changed: Another medication with the same name was changed. Make sure you understand how and when to take each.   ondansetron 8 MG disintegrating tablet Commonly known as: ZOFRAN-ODT Take 1 tablet (8 mg total) by mouth every 6 (six) hours as needed for nausea or vomiting.   OXcarbazepine 150 MG tablet Commonly known as: TRILEPTAL Take 1 tablet (150 mg total) by mouth 2 (two) times daily.   pregabalin 50 MG capsule Commonly known as: LYRICA One  qAM with a 100 mg qpm wk 1. 1 BID, wk 2. Then 1 qpm wk 3. What changed:  how much to take how to take this when to take this   Prolia 60 MG/ML Sosy injection Generic drug: denosumab Inject 60 mg into the skin every 6 (six) months.   senna-docusate 8.6-50 MG tablet Commonly known as: Senokot-S Take 1 tablet by mouth at bedtime. What changed:  when to take this reasons to take this   TYLENOL PO Take 2 tablets by mouth as needed (pain/headache).   Vitamin D (Cholecalciferol) 25 MCG (1000 UT) Tabs Take 1,000 Units by mouth daily in the afternoon.         Follow-up: Return in about 3  months (around 02/26/2023).  Mechele Claude, M.D.

## 2022-11-27 ENCOUNTER — Ambulatory Visit: Payer: Medicare Other | Admitting: Adult Health

## 2022-11-27 LAB — CMP14+EGFR
ALT: 11 IU/L (ref 0–32)
AST: 17 IU/L (ref 0–40)
Albumin: 4 g/dL (ref 3.8–4.8)
Alkaline Phosphatase: 131 IU/L — ABNORMAL HIGH (ref 44–121)
BUN/Creatinine Ratio: 29 — ABNORMAL HIGH (ref 12–28)
BUN: 22 mg/dL (ref 8–27)
Bilirubin Total: 0.3 mg/dL (ref 0.0–1.2)
CO2: 19 mmol/L — ABNORMAL LOW (ref 20–29)
Calcium: 9.4 mg/dL (ref 8.7–10.3)
Chloride: 101 mmol/L (ref 96–106)
Creatinine, Ser: 0.77 mg/dL (ref 0.57–1.00)
Globulin, Total: 2.8 g/dL (ref 1.5–4.5)
Glucose: 330 mg/dL — ABNORMAL HIGH (ref 70–99)
Potassium: 4.8 mmol/L (ref 3.5–5.2)
Sodium: 135 mmol/L (ref 134–144)
Total Protein: 6.8 g/dL (ref 6.0–8.5)
eGFR: 80 mL/min/{1.73_m2} (ref >59)

## 2022-11-27 LAB — CBC WITH DIFFERENTIAL/PLATELET
Basophils Absolute: 0 10*3/uL (ref 0.0–0.2)
Basos: 0 %
EOS (ABSOLUTE): 0.1 10*3/uL (ref 0.0–0.4)
Eos: 2 %
Hematocrit: 40.3 % (ref 34.0–46.6)
Hemoglobin: 13.1 g/dL (ref 11.1–15.9)
Immature Grans (Abs): 0 10*3/uL (ref 0.0–0.1)
Immature Granulocytes: 0 %
Lymphocytes Absolute: 2.7 10*3/uL (ref 0.7–3.1)
Lymphs: 30 %
MCH: 28.8 pg (ref 26.6–33.0)
MCHC: 32.5 g/dL (ref 31.5–35.7)
MCV: 89 fL (ref 79–97)
Monocytes Absolute: 0.6 10*3/uL (ref 0.1–0.9)
Monocytes: 7 %
Neutrophils Absolute: 5.5 10*3/uL (ref 1.4–7.0)
Neutrophils: 61 %
Platelets: 215 10*3/uL (ref 150–450)
RBC: 4.55 x10E6/uL (ref 3.77–5.28)
RDW: 14.9 % (ref 11.7–15.4)
WBC: 9 10*3/uL (ref 3.4–10.8)

## 2022-11-28 LAB — URINE CULTURE

## 2022-11-28 NOTE — Telephone Encounter (Signed)
Pt ready for scheduling for Prolia on or after : 11/28/22  Out-of-pocket cost due at time of visit: $0  Primary: medicare Prolia co-insurance: 0% Admin fee co-insurance: 0%  Secondary: [employer] Prolia co-insurance:  Admin fee co-insurance:   Medical Benefit Details: Date Benefits were checked: 11/27/22 Deductible: $240 met of $240 required/ Coinsurance: 0%/ Admin Fee: 0%  Prior Auth: N/A PA# Expiration Date:    Pharmacy benefit: Copay $--- If patient wants fill through the pharmacy benefit please send prescription to:  --- , and include estimated need by date in rx notes. Pharmacy will ship medication directly to the office.  Patient NOT eligible for Prolia Copay Card. Copay Card can make patient's cost as little as $25. Link to apply: [email or web address]  ** This summary of benefits is an estimation of the patient's out-of-pocket cost. Exact cost may very based on individual plan coverage.

## 2022-11-30 ENCOUNTER — Encounter: Payer: Self-pay | Admitting: Family Medicine

## 2022-12-01 ENCOUNTER — Telehealth: Payer: Self-pay | Admitting: Adult Health

## 2022-12-01 NOTE — Telephone Encounter (Signed)
Left message that endometrial lining was thickened on [place], will need endometrial biopsy with MD, call office for appointment. Amber Stephenson

## 2022-12-01 NOTE — Telephone Encounter (Signed)
Pt would like a call back from [facility]

## 2022-12-01 NOTE — Telephone Encounter (Signed)
Appointment given for 07/05 @ 3:30pm

## 2022-12-02 DIAGNOSIS — D485 Neoplasm of uncertain behavior of skin: Secondary | ICD-10-CM | POA: Diagnosis not present

## 2022-12-02 DIAGNOSIS — D225 Melanocytic nevi of trunk: Secondary | ICD-10-CM | POA: Diagnosis not present

## 2022-12-02 NOTE — Telephone Encounter (Signed)
Pt aware that endometrial lining is thickened, will get endometrial biopsy, she prefers female, will get with Dr Charlotta Newton

## 2022-12-03 ENCOUNTER — Telehealth: Payer: Self-pay | Admitting: *Deleted

## 2022-12-03 ENCOUNTER — Telehealth: Payer: Self-pay | Admitting: Physician Assistant

## 2022-12-03 DIAGNOSIS — I509 Heart failure, unspecified: Secondary | ICD-10-CM | POA: Diagnosis not present

## 2022-12-03 DIAGNOSIS — I110 Hypertensive heart disease with heart failure: Secondary | ICD-10-CM | POA: Diagnosis not present

## 2022-12-03 DIAGNOSIS — E1140 Type 2 diabetes mellitus with diabetic neuropathy, unspecified: Secondary | ICD-10-CM | POA: Diagnosis not present

## 2022-12-03 DIAGNOSIS — F319 Bipolar disorder, unspecified: Secondary | ICD-10-CM | POA: Diagnosis not present

## 2022-12-03 DIAGNOSIS — S0003XD Contusion of scalp, subsequent encounter: Secondary | ICD-10-CM | POA: Diagnosis not present

## 2022-12-03 DIAGNOSIS — I482 Chronic atrial fibrillation, unspecified: Secondary | ICD-10-CM | POA: Diagnosis not present

## 2022-12-03 NOTE — Telephone Encounter (Signed)
Patient left message stating she has not heard back from anyone about testing she is supposed to have done.

## 2022-12-03 NOTE — Telephone Encounter (Signed)
Fax from [facility] RE: Lisinopril/hydrochlorothiazide 10/12.5 Unavailable, Brand Zestoretic 10/12.5 not an option and Brand Prinzide 10/12.5 is discontinued Lisinopril/hydrochlorothiazide 20/25 is in stock but not a scored tablet Please advise on an alternative of YOUR Choice and send in a new script

## 2022-12-03 NOTE — Telephone Encounter (Signed)
I advised to patient of no meds at this time. She thanked me for calling.

## 2022-12-05 ENCOUNTER — Ambulatory Visit: Payer: Medicare Other

## 2022-12-05 DIAGNOSIS — M1990 Unspecified osteoarthritis, unspecified site: Secondary | ICD-10-CM

## 2022-12-05 MED ORDER — DENOSUMAB 60 MG/ML ~~LOC~~ SOSY
60.0000 mg | PREFILLED_SYRINGE | Freq: Once | SUBCUTANEOUS | Status: DC
Start: 2022-12-05 — End: 2023-01-18

## 2022-12-08 ENCOUNTER — Ambulatory Visit (INDEPENDENT_AMBULATORY_CARE_PROVIDER_SITE_OTHER): Payer: Medicare Other | Admitting: Internal Medicine

## 2022-12-08 ENCOUNTER — Other Ambulatory Visit: Payer: Self-pay | Admitting: Family Medicine

## 2022-12-08 ENCOUNTER — Ambulatory Visit: Payer: Medicare Other | Admitting: Physician Assistant

## 2022-12-08 ENCOUNTER — Encounter: Payer: Self-pay | Admitting: Internal Medicine

## 2022-12-08 VITALS — BP 124/70 | HR 87 | Ht 62.0 in | Wt 190.0 lb

## 2022-12-08 DIAGNOSIS — Z794 Long term (current) use of insulin: Secondary | ICD-10-CM | POA: Diagnosis not present

## 2022-12-08 DIAGNOSIS — E119 Type 2 diabetes mellitus without complications: Secondary | ICD-10-CM

## 2022-12-08 LAB — POCT GLYCOSYLATED HEMOGLOBIN (HGB A1C): Hemoglobin A1C: 9.8 % — AB (ref 4.0–5.6)

## 2022-12-08 MED ORDER — LANTUS SOLOSTAR 100 UNIT/ML ~~LOC~~ SOPN: 30.0000 [IU] | PEN_INJECTOR | Freq: Two times a day (BID) | SUBCUTANEOUS | 3 refills | Status: DC

## 2022-12-08 MED ORDER — LISINOPRIL 10 MG PO TABS: 10.0000 mg | ORAL_TABLET | Freq: Every day | ORAL | 3 refills | Status: DC

## 2022-12-08 MED ORDER — METFORMIN HCL ER 500 MG PO TB24: 1000.0000 mg | ORAL_TABLET | Freq: Every day | ORAL | 3 refills | Status: DC

## 2022-12-08 MED ORDER — HYDROCHLOROTHIAZIDE 12.5 MG PO CAPS: 12.5000 mg | ORAL_CAPSULE | Freq: Every day | ORAL | 3 refills | Status: DC

## 2022-12-08 NOTE — Patient Instructions (Addendum)
-   Restart Metformin 500 mg , 1 tablet with breakfast for 2 weeks, than increase to 2 tablets with breakfast daily ( you may separate it by taking 1 tablet with Breakfast and 1 tablet with supper) - Increase Lantus 30 units twice daily  - Continue Mounjaro 5 mg weekly  - Continue Humalog 4 units with each meal  -Humalog correctional insulin: ADD extra units on insulin to your meal-time Humalog  dose if your blood sugars are higher than 160. Use the scale below to help guide you before each meal   Blood sugar before meal Number of units to inject  Less than 160 0 unit  161 -  190 1 units  191 -  220 2 units  221 -  250 3 units  251 -  280 4 units  281 -  310 5 units  311 -  340 6 units  341 -  370 7 units  371 -  400 8 units       HOW TO TREAT LOW BLOOD SUGARS (Blood sugar LESS THAN 70 MG/DL) Please follow the RULE OF 15 for the treatment of hypoglycemia treatment (when your (blood sugars are less than 70 mg/dL)   STEP 1: Take 15 grams of carbohydrates when your blood sugar is low, which includes:  3-4 GLUCOSE TABS  OR 3-4 OZ OF JUICE OR REGULAR SODA OR ONE TUBE OF GLUCOSE GEL    STEP 2: RECHECK blood sugar in 15 MINUTES STEP 3: If your blood sugar is still low at the 15 minute recheck --> then, go back to STEP 1 and treat AGAIN with another 15 grams of carbohydrates.

## 2022-12-08 NOTE — Telephone Encounter (Signed)
I [facility] for her to have the two as separate pills, one each daily of lisinopril 10 mg and HCTZ 12.5 mg

## 2022-12-08 NOTE — Progress Notes (Signed)
Name: Amber Stephenson  Age/ Sex: 77 y.o., female   MRN/ DOB: [id number], 1946-04-29     PCP: Mechele Claude, MD   Reason for Endocrinology Evaluation: Type 2 Diabetes Mellitus  Initial Endocrine Consultative Visit: 09/24/2020    PATIENT IDENTIFIER: Amber Stephenson is a 77 y.o. female with a past medical history of T2DM, HTN, Dyslipidemia, OSA, CHF and A.Fib. The patient has followed with Endocrinology clinic since 09/24/2020 for consultative assistance with management of her diabetes.  DIABETIC HISTORY:  Amber Stephenson was diagnosed with DM in 2007, she has been on basal insulin for years, prandial insulin started 2016. GLP-1 agonists started in 2017. Metformin restarted 2017. Her hemoglobin A1c has ranged from 6.7% in 08/2019, peaking at 10.9% in 04/2020.  Saw Dr. Fransico Him in 2016 and Dr. Lucianne Muss in 2017   On her initial visit to our clinic she had an A1c 9.1% . We increased Metformin, Rybelsus and adjusted MDI regimen.    Jardiance discontinues 10/2021 due to recurrent genital skin infections and yeast infections   Switch Rybelsus to [facility] 05/2022  SUBJECTIVE:   During the last visit (05/28/2022): A1c 6.8%     Today (12/08/2022): Amber Stephenson is here for a follow up on diabetes management.  She is accompanied by her spouse today. She checks her blood sugars multiple  times daily through the CGM. Marland Kitchen The patient has not had hypoglycemic episodes since the last clinic visit    Since her last visit here, She has been to the ED multiple times for variable reasons to include dyspnea, hypertension, gout, falls, acute cystitis and migraine headaches Metformin has been discontinued during one of her hospitalization, no prior intolerance issue  She had a follow-up with podiatry Dr. Adam Phenix 10/02/2022    Denies nausea, or vomiting       No changes in bowel movements                                                                                                                     HOME  DIABETES REGIMEN:  Metformin 500 mg 1 tablet BID - not taking  Mounjaro 5 mg weekly Lantus 25 units BID Humalog 4 units TIDQAC CF: Humalog (BG-130/30) TIDQAC     Statin: yes ACE-I/ARB: no Prior Diabetic Education: no    CONTINUOUS GLUCOSE MONITORING RECORD INTERPRETATION    Dates of Recording: 6/25-12/08/2022  Sensor description: dexcom   Results statistics:   CGM use % of time 100  Average and SD 259/61  Time in range 12 %  % Time Above 180 33  % Time above 250 55  % Time Below target 0     Glycemic patterns summary: BG's trend down overnight but continue to be above goal, hypoglycemia worsens throughout the day  Hyperglycemic episodes  postprandial   Hypoglycemic episodes occurred  N/A  Overnight periods: trends down      DIABETIC COMPLICATIONS: Microvascular complications:   Denies: CKD,  retinopathy , neuropathy ( she has this in the  charts but denies neuropathy) Last eye exam: Completed 07/2020   Macrovascular complications:   Denies: CAD, PVD, CVA    HISTORY:  Past Medical History:  Past Medical History:  Diagnosis Date   Acquired hammer toe 12/06/2020   Acute metabolic encephalopathy 06/27/2022   Allergic rhinitis 02/24/2019   Ambulatory dysfunction 04/23/2020   Atrial fibrillation with RVR (HCC) 05/19/2017   Back pain/sacroiliitis--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   Benign paroxysmal positional vertigo due to bilateral vestibular disorder 05/20/2019   Bipolar 1 disorder (HCC) 01/23/2015   with GAD, benzo dependence   BPPV (benign paroxysmal positional vertigo) 05/20/2019   CAD (coronary artery disease)    Nonobstructive; Managed by Dr. Purvis Sheffield   Cardiomegaly 01/12/2018   CHF (congestive heart failure) 06/08/2022   Chronic back pain 01/04/2015   Chronic constipation 04/25/2020   Chronic diastolic heart failure (HCC) 05/30/2015    Chronic pain syndrome 08/22/2019   back pain, sacroiliitis   Chronic pain syndrome 08/22/2019   Chronic post-traumatic stress disorder (PTSD) 12/06/2020   Diabetic neuropathy (HCC) 02/06/2016   Dyslipidemia 09/24/2020   Dyspnea on exertion    Essential hypertension    Family history of coronary arteriosclerosis 05/30/2015   Functional diarrhea 10/26/2020   Head trauma 09/17/2020   Hemorrhoids 03/11/2022   Herpes genitalis in women 07/16/2015   History of adenomatous polyp of colon 05/21/2019   Overview:   03/31/17: Colonoscopy: nonadvanced adenoma, microscopic colitis, f/u 5 yrs, [facility]/GAP   Insomnia 01/23/2015   Lipoma 02/08/2015   Migraine headache with aura 02/12/2016   Myofascial pain dysfunction syndrome 08/22/2019   Myofascial pain dysfunction syndrome 08/22/2019   Non-alcoholic fatty liver disease 01/12/2018   Opioid dependence (HCC) 12/06/2020   OSA (obstructive sleep apnea) 02/24/2019   10/09/2018 - HST  - AHI 40.6    Osteopenia 12/06/2020   Rx alendronate 35 mg.   Pinguecula 12/06/2020   Postcoital bleeding 12/06/2020   Presbyopia 12/06/2020   Pulmonary hypertension    RLS (restless legs syndrome) 04/27/2015   Superficial fungus infection of skin 09/03/2021   Tremor, essential 12/11/2021   Type II diabetes mellitus (HCC) 09/24/2020   Past Surgical History:  Past Surgical History:  Procedure Laterality Date   BREAST REDUCTION SURGERY     EYE SURGERY Right    cateracts   HAMMER TOE SURGERY     LEFT HEART CATH AND CORONARY ANGIOGRAPHY N/A 02/03/2018   Procedure: LEFT HEART CATH AND CORONARY ANGIOGRAPHY;  Surgeon: [name], Peter M, MD;  Location: [facility] INVASIVE CV LAB;  Service: Cardiovascular;  Laterality: N/A;   REVERSE SHOULDER ARTHROPLASTY Right 08/19/2019   Procedure: REVERSE SHOULDER ARTHROPLASTY;  Surgeon: Beverely Low, MD;  Location: [facility] ORS;  Service: Orthopedics;  Laterality: Right;  interscalene block   SHOULDER SURGERY Right    "I BROKE MY SHOUDLER   THIGH  SURGERY     "TO REMOVE A TUMOR "   Social History:  reports that she has never smoked. She has never used smokeless tobacco. She reports that she does not currently use alcohol. She reports that she does not use drugs. Family History:  Family History  Problem Relation Age of Onset   Alzheimer's disease Father    Diabetes Mother    Heart disease Mother    Stroke Brother    Heart disease Brother    Mental illness Brother    Diabetes Brother    Heart disease  Sister        CABG   Diabetes Sister    Alcohol abuse Sister      HOME MEDICATIONS: Allergies as of 12/08/2022       Reactions   Iodine Anaphylaxis   Ivp Dye [iodinated Contrast Media] Anaphylaxis, Swelling   Throat closes   Latex Other (See Comments)   Latex tape pulls skin with it   Tizanidine Other (See Comments)   Weakness, goofy, bad dreams        Medication List        Accurate as of December 08, 2022  2:18 PM. If you have any questions, ask your nurse or doctor.          STOP taking these medications    Dexcom G6 Transmitter Misc Stopped by: Scarlette Shorts, MD   lisinopril-hydrochlorothiazide 10-12.5 MG tablet Commonly known as: ZESTORETIC Stopped by: Mechele Claude, MD       TAKE these medications    albuterol 108 (90 Base) MCG/ACT inhaler Commonly known as: VENTOLIN HFA Inhale 2 puffs into the lungs every 6 (six) hours as needed for wheezing or shortness of breath.   allopurinol 100 MG tablet Commonly known as: ZYLOPRIM Take 100 mg by mouth 2 (two) times daily.   apixaban 5 MG Tabs tablet Commonly known as: Eliquis Take 1 tablet (5 mg total) by mouth 2 (two) times daily.   ARIPiprazole 2 MG tablet Commonly known as: ABILIFY Take 2 mg by mouth at bedtime.   cyanocobalamin 1000 MCG tablet Commonly known as: VITAMIN B12 Take 1 tablet (1,000 mcg total) by mouth daily.   Dexcom G7 Sensor Misc Change sensor every 10 days What changed:  how much to take how to take this when to  take this   doxepin 10 MG capsule Commonly known as: SINEQUAN Take 10 mg by mouth at bedtime.   Emgality 120 MG/ML Soaj Generic drug: Galcanezumab-gnlm INJECT ONCE A MONTH What changed:  how much to take how to take this when to take this   fluticasone-salmeterol 100-50 MCG/ACT Aepb Commonly known as: Advair Diskus Inhale 1 puff into the lungs 2 (two) times daily.   hydrochlorothiazide 12.5 MG capsule Commonly known as: MICROZIDE Take 1 capsule (12.5 mg total) by mouth daily. Started by: Mechele Claude, MD   insulin lispro 100 UNIT/ML KwikPen Commonly known as: HumaLOG KwikPen Max daily 50 units What changed:  how much to take how to take this when to take this additional instructions   Insulin Pen Needle 29G X Misc 1 Device by Does not apply route daily in the afternoon.   Lantus SoloStar 100 UNIT/ML Solostar Pen Generic drug: insulin glargine Inject 30 Units into the skin 2 (two) times daily. What changed: how much to take Changed by: Scarlette Shorts, MD   lisinopril 10 MG tablet Commonly known as: ZESTRIL Take 1 tablet (10 mg total) by mouth daily. Started by: Mechele Claude, MD   LORazepam 1 MG tablet Commonly known as: ATIVAN Take 0.5 tablets (0.5 mg total) by mouth 2 (two) times daily.   metFORMIN 500 MG 24 hr tablet Commonly known as: GLUCOPHAGE-XR Take 2 tablets (1,000 mg total) by mouth daily with breakfast. Started by: Scarlette Shorts, MD   metoprolol tartrate 50 MG tablet Commonly known as: LOPRESSOR Take 1 tablet (50 mg total) by mouth 2 (two) times daily.   morphine 15 MG 12 hr tablet Commonly known as: MS Contin Take 1 tablet (15 mg total) by mouth every  12 (twelve) hours. For chronic pain   Mounjaro 5 MG/0.5ML Pen Generic drug: tirzepatide Inject 5 mg into the skin once a week.   naloxone 4 MG/0.1ML Liqd nasal spray kit Commonly known as: NARCAN Place 1 spray into the nose once.   nystatin ointment Commonly known as:  MYCOSTATIN Apply 1 Application topically 2 (two) times daily. What changed:  when to take this reasons to take this   nystatin powder Commonly known as: MYCOSTATIN/NYSTOP Apply 1 Application topically as needed (rash). What changed: Another medication with the same name was changed. Make sure you understand how and when to take each.   ondansetron 8 MG disintegrating tablet Commonly known as: ZOFRAN-ODT Take 1 tablet (8 mg total) by mouth every 6 (six) hours as needed for nausea or vomiting.   OXcarbazepine 150 MG tablet Commonly known as: TRILEPTAL Take 1 tablet (150 mg total) by mouth 2 (two) times daily.   pregabalin 50 MG capsule Commonly known as: LYRICA One  qAM with a 100 mg qpm wk 1. 1 BID, wk 2. Then 1 qpm wk 3. What changed:  how much to take how to take this when to take this   Prolia 60 MG/ML Sosy injection Generic drug: denosumab Inject 60 mg into the skin every 6 (six) months.   senna-docusate 8.6-50 MG tablet Commonly known as: Senokot-S Take 1 tablet by mouth at bedtime. What changed:  when to take this reasons to take this   TYLENOL PO Take 2 tablets by mouth as needed (pain/headache).   Vitamin D (Cholecalciferol) 25 MCG (1000 UT) Tabs Take 1,000 Units by mouth daily in the afternoon.         OBJECTIVE:   Vital Signs: BP 124/70 (BP Location: Right Arm, Patient Position: Sitting, Cuff Size: Large)   Pulse 87   Ht 5\' 2"  (1.575 m)   Wt 190 lb (86.2 kg)   SpO2 94%   BMI 34.75 kg/m   Wt Readings from Last 3 Encounters:  12/08/22 190 lb (86.2 kg)  11/26/22 188 lb 9.6 oz (85.5 kg)  11/24/22 193 lb (87.5 kg)     Exam: General: Amber Stephenson appears well and is in NAD  Lungs: Clear with good BS bilat   Heart: RRR   Extremities: No pretibial edema.   Neuro: MS is good with appropriate affect, Amber Stephenson is alert and Ox3    DM foot exam: 10/02/2022 per podiatry    The skin of the feet is without sores or ulcerations, left 2nd toe deformity  The pedal  pulses are 1+ on right and 1+ on left. The sensation is intact to a screening 5.07, 10 gram monofilament bilaterally   DATA REVIEWED:  Lab Results  Component Value Date   HGBA1C 9.8 (A) 12/08/2022   HGBA1C 6.8 (A) 05/28/2022   HGBA1C 7.3 (A) 11/22/2021    Latest Reference Range & Units 11/26/22 15:43  Sodium 134 - 144 mmol/L 135  Potassium 3.5 - 5.2 mmol/L 4.8  Chloride 96 - 106 mmol/L 101  CO2 20 - 29 mmol/L 19 (L)  Glucose 70 - 99 mg/dL 409 (H)  BUN 8 - 27 mg/dL 22  Creatinine 8.11 - 9.14 mg/dL 7.82  Calcium 8.7 - 95.6 mg/dL 9.4  BUN/Creatinine Ratio 12 - 28  29 (H)  eGFR >59 mL/min/1.73 80  Alkaline Phosphatase 44 - 121 IU/L 131 (H)  Albumin 3.8 - 4.8 g/dL 4.0  AST 0 - 40 IU/L 17  ALT 0 - 32 IU/L 11  Total Protein  6.0 - 8.5 g/dL 6.8  Total Bilirubin 0.0 - 1.2 mg/dL 0.3  (L): Data is abnormally low (H): Data is abnormally high  Old records , labs and images have been reviewed.   ASSESSMENT / PLAN / RECOMMENDATIONS:   1) Type 2 Diabetes Mellitus, Optimally controlled, Without complications - Most recent A1c of 9.8 %. Goal A1c < 7.0 %.    -A1c has trended up from 6.8% to 9.8% -Patient is on less basal insulin than previously prescribed, she has been noted with hypoglycemia on CGM download, she is not consistent with prandial insulin due to memory issues  -I have recommended increasing Mounjaro, but they just received her 19-month supply, so we have opted to remain on current dose of Mounjaro, they will contact me to increase the dose prior to the next refill -Will restart metformin as below -Most recent labs show normal GFR  MEDICATIONS:  -Restart metformin 500 mg XR, 1 tablets with breakfast for 2 weeks, then increase to 2 tablets daily -Increase Lantus 30 units twice daily -Continue Mounjaro 5 mg weekly -Continue Humalog 4 units TIDQAC - Correction factor: Humalog (BG -130/30) 3 times daily before every meal   EDUCATION / INSTRUCTIONS: BG monitoring  instructions: Patient is instructed to check her blood sugars 3 times a day, before meals . Call [place] [facility] if: BG persistently < 70 I reviewed the Rule of 15 for the treatment of hypoglycemia in detail with the patient. Literature supplied.   2) Diabetic complications:  Eye: Does not have known diabetic retinopathy.  Neuro/ Feet: Does not have known diabetic peripheral neuropathy .  Renal: Patient does not have known baseline CKD. She   is not on an ACEI/ARB at present.       F/U in 3 months    I spent 30 minutes preparing to see the patient by review of recent labs, imaging and procedures, obtaining and reviewing separately obtained history, communicating with the patient/family or caregiver, ordering medications, tests or procedures, and documenting clinical information in the EHR including the differential Dx, treatment, and any further evaluation and other management    Signed electronically by: Lyndle Herrlich, MD  [facility] Endocrinology  [facility] [address] [place], [place] [address] Phone: [phone] FAX: [phone]   CC: Mechele Claude, MD [address] [place] [address] Phone: [phone]  Fax: [phone]  Return to Endocrinology clinic as below: Future Appointments  Date Time Provider Department Center  12/18/2022  3:00 PM [facility]-ANNUAL WELLNESS VISIT [facility] None  12/22/2022  2:00 PM Genice Rouge, MD [facility] CPR  12/25/2022  3:30 PM [name], DO [facility]  03/03/2023 11:30 AM Gwynneth Munson, Sung Amabile, PA-C [facility] None

## 2022-12-08 NOTE — Telephone Encounter (Signed)
Husband aware.

## 2022-12-09 ENCOUNTER — Encounter: Payer: Self-pay | Admitting: Internal Medicine

## 2022-12-09 DIAGNOSIS — I482 Chronic atrial fibrillation, unspecified: Secondary | ICD-10-CM | POA: Diagnosis not present

## 2022-12-09 DIAGNOSIS — S0003XD Contusion of scalp, subsequent encounter: Secondary | ICD-10-CM | POA: Diagnosis not present

## 2022-12-09 DIAGNOSIS — I509 Heart failure, unspecified: Secondary | ICD-10-CM | POA: Diagnosis not present

## 2022-12-09 DIAGNOSIS — I110 Hypertensive heart disease with heart failure: Secondary | ICD-10-CM | POA: Diagnosis not present

## 2022-12-09 DIAGNOSIS — E1140 Type 2 diabetes mellitus with diabetic neuropathy, unspecified: Secondary | ICD-10-CM | POA: Diagnosis not present

## 2022-12-09 DIAGNOSIS — F319 Bipolar disorder, unspecified: Secondary | ICD-10-CM | POA: Diagnosis not present

## 2022-12-15 DIAGNOSIS — Z7985 Long-term (current) use of injectable non-insulin antidiabetic drugs: Secondary | ICD-10-CM | POA: Diagnosis not present

## 2022-12-15 DIAGNOSIS — E538 Deficiency of other specified B group vitamins: Secondary | ICD-10-CM | POA: Diagnosis not present

## 2022-12-15 DIAGNOSIS — E1140 Type 2 diabetes mellitus with diabetic neuropathy, unspecified: Secondary | ICD-10-CM | POA: Diagnosis not present

## 2022-12-15 DIAGNOSIS — Z79899 Other long term (current) drug therapy: Secondary | ICD-10-CM | POA: Diagnosis not present

## 2022-12-15 DIAGNOSIS — Z7951 Long term (current) use of inhaled steroids: Secondary | ICD-10-CM | POA: Diagnosis not present

## 2022-12-15 DIAGNOSIS — Z6834 Body mass index (BMI) 34.0-34.9, adult: Secondary | ICD-10-CM | POA: Diagnosis not present

## 2022-12-15 DIAGNOSIS — I2510 Atherosclerotic heart disease of native coronary artery without angina pectoris: Secondary | ICD-10-CM | POA: Diagnosis not present

## 2022-12-15 DIAGNOSIS — G8929 Other chronic pain: Secondary | ICD-10-CM | POA: Diagnosis not present

## 2022-12-15 DIAGNOSIS — F319 Bipolar disorder, unspecified: Secondary | ICD-10-CM | POA: Diagnosis not present

## 2022-12-15 DIAGNOSIS — M5441 Lumbago with sciatica, right side: Secondary | ICD-10-CM | POA: Diagnosis not present

## 2022-12-15 DIAGNOSIS — I110 Hypertensive heart disease with heart failure: Secondary | ICD-10-CM | POA: Diagnosis not present

## 2022-12-15 DIAGNOSIS — Z7901 Long term (current) use of anticoagulants: Secondary | ICD-10-CM | POA: Diagnosis not present

## 2022-12-15 DIAGNOSIS — E785 Hyperlipidemia, unspecified: Secondary | ICD-10-CM | POA: Diagnosis not present

## 2022-12-15 DIAGNOSIS — M109 Gout, unspecified: Secondary | ICD-10-CM | POA: Diagnosis not present

## 2022-12-15 DIAGNOSIS — Z87440 Personal history of urinary (tract) infections: Secondary | ICD-10-CM | POA: Diagnosis not present

## 2022-12-15 DIAGNOSIS — E669 Obesity, unspecified: Secondary | ICD-10-CM | POA: Diagnosis not present

## 2022-12-15 DIAGNOSIS — Z794 Long term (current) use of insulin: Secondary | ICD-10-CM | POA: Diagnosis not present

## 2022-12-15 DIAGNOSIS — I083 Combined rheumatic disorders of mitral, aortic and tricuspid valves: Secondary | ICD-10-CM | POA: Diagnosis not present

## 2022-12-15 DIAGNOSIS — Z96611 Presence of right artificial shoulder joint: Secondary | ICD-10-CM | POA: Diagnosis not present

## 2022-12-15 DIAGNOSIS — I482 Chronic atrial fibrillation, unspecified: Secondary | ICD-10-CM | POA: Diagnosis not present

## 2022-12-15 DIAGNOSIS — S0003XD Contusion of scalp, subsequent encounter: Secondary | ICD-10-CM | POA: Diagnosis not present

## 2022-12-15 DIAGNOSIS — M19012 Primary osteoarthritis, left shoulder: Secondary | ICD-10-CM | POA: Diagnosis not present

## 2022-12-15 DIAGNOSIS — Z86711 Personal history of pulmonary embolism: Secondary | ICD-10-CM | POA: Diagnosis not present

## 2022-12-15 DIAGNOSIS — I509 Heart failure, unspecified: Secondary | ICD-10-CM | POA: Diagnosis not present

## 2022-12-15 DIAGNOSIS — Z9981 Dependence on supplemental oxygen: Secondary | ICD-10-CM | POA: Diagnosis not present

## 2022-12-16 ENCOUNTER — Telehealth: Payer: Self-pay | Admitting: Physical Medicine and Rehabilitation

## 2022-12-16 MED ORDER — MORPHINE SULFATE ER 15 MG PO TBCR: 15.0000 mg | EXTENDED_RELEASE_TABLET | Freq: Two times a day (BID) | ORAL | 0 refills | Status: DC

## 2022-12-16 NOTE — Telephone Encounter (Signed)
Patient requesting refill on morphine.

## 2022-12-17 DIAGNOSIS — M79672 Pain in left foot: Secondary | ICD-10-CM | POA: Diagnosis not present

## 2022-12-17 DIAGNOSIS — M79671 Pain in right foot: Secondary | ICD-10-CM | POA: Diagnosis not present

## 2022-12-17 DIAGNOSIS — M109 Gout, unspecified: Secondary | ICD-10-CM | POA: Diagnosis not present

## 2022-12-17 DIAGNOSIS — E669 Obesity, unspecified: Secondary | ICD-10-CM | POA: Diagnosis not present

## 2022-12-17 DIAGNOSIS — G894 Chronic pain syndrome: Secondary | ICD-10-CM | POA: Diagnosis not present

## 2022-12-17 DIAGNOSIS — Z6835 Body mass index (BMI) 35.0-35.9, adult: Secondary | ICD-10-CM | POA: Diagnosis not present

## 2022-12-17 DIAGNOSIS — R768 Other specified abnormal immunological findings in serum: Secondary | ICD-10-CM | POA: Diagnosis not present

## 2022-12-18 DIAGNOSIS — I509 Heart failure, unspecified: Secondary | ICD-10-CM | POA: Diagnosis not present

## 2022-12-18 DIAGNOSIS — I482 Chronic atrial fibrillation, unspecified: Secondary | ICD-10-CM | POA: Diagnosis not present

## 2022-12-18 DIAGNOSIS — F319 Bipolar disorder, unspecified: Secondary | ICD-10-CM | POA: Diagnosis not present

## 2022-12-18 DIAGNOSIS — I110 Hypertensive heart disease with heart failure: Secondary | ICD-10-CM | POA: Diagnosis not present

## 2022-12-18 DIAGNOSIS — S0003XD Contusion of scalp, subsequent encounter: Secondary | ICD-10-CM | POA: Diagnosis not present

## 2022-12-18 DIAGNOSIS — E1140 Type 2 diabetes mellitus with diabetic neuropathy, unspecified: Secondary | ICD-10-CM | POA: Diagnosis not present

## 2022-12-22 ENCOUNTER — Encounter
Payer: Medicare Other | Attending: Physical Medicine and Rehabilitation | Admitting: Physical Medicine and Rehabilitation

## 2022-12-22 ENCOUNTER — Ambulatory Visit (INDEPENDENT_AMBULATORY_CARE_PROVIDER_SITE_OTHER): Payer: Medicare Other | Admitting: Adult Health

## 2022-12-22 ENCOUNTER — Encounter: Payer: Self-pay | Admitting: Adult Health

## 2022-12-22 ENCOUNTER — Encounter: Payer: Self-pay | Admitting: Physical Medicine and Rehabilitation

## 2022-12-22 VITALS — BP 119/84 | HR 74 | Ht 62.0 in | Wt 188.6 lb

## 2022-12-22 VITALS — BP 133/85 | HR 72 | Ht 62.0 in | Wt 188.5 lb

## 2022-12-22 DIAGNOSIS — N950 Postmenopausal bleeding: Secondary | ICD-10-CM

## 2022-12-22 DIAGNOSIS — R296 Repeated falls: Secondary | ICD-10-CM

## 2022-12-22 DIAGNOSIS — G894 Chronic pain syndrome: Secondary | ICD-10-CM | POA: Diagnosis not present

## 2022-12-22 DIAGNOSIS — L292 Pruritus vulvae: Secondary | ICD-10-CM | POA: Insufficient documentation

## 2022-12-22 DIAGNOSIS — Z5181 Encounter for therapeutic drug level monitoring: Secondary | ICD-10-CM | POA: Diagnosis not present

## 2022-12-22 DIAGNOSIS — M5441 Lumbago with sciatica, right side: Secondary | ICD-10-CM | POA: Insufficient documentation

## 2022-12-22 DIAGNOSIS — G8929 Other chronic pain: Secondary | ICD-10-CM

## 2022-12-22 MED ORDER — MORPHINE SULFATE ER 15 MG PO TBCR: 15.0000 mg | EXTENDED_RELEASE_TABLET | Freq: Two times a day (BID) | ORAL | 0 refills | Status: DC

## 2022-12-22 NOTE — Patient Instructions (Signed)
Patient is a 77 yr old female with hx of  CAD, pulmonary HTN- and DM- with CKD- Stage III here for right low back pain with sciatica  As well as upper back/neck pain/myofascial pain. Pt is here for chronic pain -4   Went over how using body to trick body's receptors- so in a few years, will switch to an Oxy product- when her body gets acclimated. Then will switch  back and forth to keep pain adequately controlled.    2.  Con't MS Contin 15 mg 2x/day- sent in next Rx- last refill 1 week ago. 01/13/23     3. Sent in for 02/10/23 the 2nd dose of MS contin-    4.  F/U in 3 months-   5. UDS due - any [facility] in the next 24 hours- advised since just peed, needs to be be done in 24 hours

## 2022-12-22 NOTE — Progress Notes (Signed)
  Subjective:     Patient ID: Amber Stephenson, female   DOB: 08-Aug-1945, 77 y.o.   MRN: [id number]  HPI [name] is a 77 year old white female, married, PM, in complaining of vulva itching and bleeding again. She has felt anxious and short of breath today. She had appt in [place] earlier today. She said she did not sleep with oxygen last night.  PCP is Dr Darlyn Read.   Review of Systems +Vulva itching    +PMB Reviewed past medical,surgical, social and family history. Reviewed medications and allergies.  Objective:   Physical Exam BP 133/85 (BP Location: Right Arm, Patient Position: Sitting, Cuff Size: Normal)   Pulse 72   Ht 5\' 2"  (1.575 m)   Wt 188 lb 8 oz (85.5 kg)   SpO2 97%   BMI 34.48 kg/m   Skin warm and dry.Pelvic: external genitalia is normal in appearance no lesions, vagina: +blood,urethra has no lesions or masses noted, cervix:poorly visualized today, uterus: normal size, shape and contour, non tender, no masses felt, adnexa: no masses or tenderness noted. Bladder is non tender and no masses felt. Painted vulva with gentian violet.     [place] - 12/22/22 1624       Pregnancy Intention Screening   Does the patient want to become pregnant in the next year? N/A    Does the patient's partner want to become pregnant in the next year? N/A    Would the patient like to discuss contraceptive options today? N/A      Contraception Wrap Up   Current Method No Method - Other Reason   postmenopausal   Reason for No Current Contraceptive Method at Intake ([facility] Only) Other    End Method No Method - Other Reason   postmenopausal   Contraception Counseling Provided No            Examination chaperoned by Malachy Mood LPN  Assessment:     1. Vulvar itching Painted vulva with gentian violet   2. PMB (postmenopausal bleeding) Has endometrial biopsy appt with Dr Charlotta Newton 12/25/22      Plan:    Sleep with Oxygen tonight and check O2 sat at home  Return as scheduled 12/25/22 for  endometrial biopsy

## 2022-12-22 NOTE — Progress Notes (Signed)
Subjective:    Patient ID: Amber Stephenson, female    DOB: 04-12-1946, 77 y.o.   MRN: [id number]  HPI  Patient is a 77 yr old female with hx of  CAD, pulmonary HTN- and DM- with CKD- Stage III here for right low back pain with sciatica  As well as upper back/neck pain/myofascial pain. Pt is here for chronic pain f/u -  Doesn't want TrP injections today.    Had a fall 1 month ago- was in hospital for 2.5 days.  Thought fractured skull- but bruised entire back down to leg.  L side of face also bruised real bad.  Got 2 CT's- of neck and face.  Large scalp hematoma- hair fell out there.    No major issues right now.  MS Contin is working for her.  Still having pain, but more tolerable. -  Does HEP- several times per day.  PT comes 1-2x/week- H/H-  That's going OK.    Gets benzo's from Psychiatry- they reduced her dose- really upsetting for her.      Pain Inventory Average Pain 7 Pain Right Now 7 My pain is stabbing and aching  In the last 24 hours, has pain interfered with the following? General activity 8 Relation with others 6 Enjoyment of life 6 What TIME of day is your pain at its worst? varies Sleep (in general) Good  Pain is worse with: bending and some activites Pain improves with: rest, heat/ice, therapy/exercise, medication, TENS, and injections Relief from Meds:  na  Family History  Problem Relation Age of Onset   Alzheimer's disease Father    Diabetes Mother    Heart disease Mother    Stroke Brother    Heart disease Brother    Mental illness Brother    Diabetes Brother    Heart disease Sister        CABG   Diabetes Sister    Alcohol abuse Sister    Social History   Socioeconomic History   Marital status: Married    Spouse name: Karren Burly   Number of children: 3   Years of education: 15   Highest education level: Associate degree: academic program  Occupational History   Occupation: Retired    Comment: Chief Financial Officer  Tobacco Use   Smoking  status: Never   Smokeless tobacco: Never  Vaping Use   Vaping status: Never Used  Substance and Sexual Activity   Alcohol use: Not Currently    Alcohol/week: 0.0 standard drinks of alcohol   Drug use: No   Sexual activity: Yes    Partners: Male    Birth control/protection: Post-menopausal  Other Topics Concern   Not on file  Social History Narrative   Lives at home with husband.    They have 3 children who live away - [place], [place], and [place].   She is from [place] and most of her family lives there.   Her husbands's family live nearby   Right handed.   Social Determinants of Health   Financial Resource Strain: Low Risk  (06/30/2022)   Overall Financial Resource Strain (CARDIA)    Difficulty of Paying Living Expenses: Not hard at all  Food Insecurity: No Food Insecurity (11/13/2022)   Hunger Vital Sign    Worried About Running Out of Food in the Last Year: Never true    Ran Out of Food in the Last Year: Never true  Transportation Needs: No Transportation Needs (11/13/2022)   PRAPARE - Transportation    Lack of  Transportation (Medical): No    Lack of Transportation (Non-Medical): No  Physical Activity: Inactive (12/13/2021)   Exercise Vital Sign    Days of Exercise per Week: 0 days    Minutes of Exercise per Session: 0 min  Stress: No Stress Concern Present (12/13/2021)   [employer] of Occupational Health - Occupational Stress Questionnaire    Feeling of Stress : Not at all  Social Connections: Socially Integrated (12/13/2021)   Social Connection and Isolation Panel [NHANES]    Frequency of Communication with Friends and Family: More than three times a week    Frequency of Social Gatherings with Friends and Family: Twice a week    Attends Religious Services: More than 4 times per year    Active Member of [employer] or Organizations: Yes    Attends Engineer, structural: More than 4 times per year    Marital Status: Married   Past Surgical History:   Procedure Laterality Date   BREAST REDUCTION SURGERY     EYE SURGERY Right    cateracts   HAMMER TOE SURGERY     LEFT HEART CATH AND CORONARY ANGIOGRAPHY N/A 02/03/2018   Procedure: LEFT HEART CATH AND CORONARY ANGIOGRAPHY;  Surgeon: [name], Peter M, MD;  Location: [facility];  Service: Cardiovascular;  Laterality: N/A;   REVERSE SHOULDER ARTHROPLASTY Right 08/19/2019   Procedure: REVERSE SHOULDER ARTHROPLASTY;  Surgeon: Beverely Low, MD;  Location: [facility];  Service: Orthopedics;  Laterality: Right;  interscalene block   SHOULDER SURGERY Right    "I BROKE MY SHOUDLER   THIGH SURGERY     "TO REMOVE A TUMOR "   Past Surgical History:  Procedure Laterality Date   BREAST REDUCTION SURGERY     EYE SURGERY Right    cateracts   HAMMER TOE SURGERY     LEFT HEART CATH AND CORONARY ANGIOGRAPHY N/A 02/03/2018   Procedure: LEFT HEART CATH AND CORONARY ANGIOGRAPHY;  Surgeon: [name], Peter M, MD;  Location: [facility]VASIVE CV [facility];  Service: Cardiovascular;  Laterality: N/A;   REVERSE SHOULDER ARTHROPLASTY Right 08/19/2019   Procedure: REVERSE SHOULDER ARTHROPLASTY;  Surgeon: Beverely Low, MD;  Location: [facility];  Service: Orthopedics;  Laterality: Right;  interscalene block   SHOULDER SURGERY Right    "I BROKE MY SHOUDLER   THIGH SURGERY     "TO REMOVE A TUMOR "   Past Medical History:  Diagnosis Date   Acquired hammer toe 12/06/2020   Acute metabolic encephalopathy 06/27/2022   Allergic rhinitis 02/24/2019   Ambulatory dysfunction 04/23/2020   Atrial fibrillation with RVR (HCC) 05/19/2017   Back pain/sacroiliitis--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   Benign paroxysmal positional vertigo due to bilateral vestibular disorder 05/20/2019   Bipolar 1 disorder (HCC) 01/23/2015   with GAD, benzo dependence   BPPV (benign paroxysmal positional vertigo) 05/20/2019   CAD (coronary artery disease)     Nonobstructive; Managed by Dr. Purvis Sheffield   Cardiomegaly 01/12/2018   CHF (congestive heart failure) 06/08/2022   Chronic back pain 01/04/2015   Chronic constipation 04/25/2020   Chronic diastolic heart failure (HCC) 05/30/2015   Chronic pain syndrome 08/22/2019   back pain, sacroiliitis   Chronic pain syndrome 08/22/2019   Chronic post-traumatic stress disorder (PTSD) 12/06/2020   Diabetic neuropathy (HCC) 02/06/2016   Dyslipidemia 09/24/2020   Dyspnea on exertion    Essential hypertension    Family history of coronary  arteriosclerosis 05/30/2015   Functional diarrhea 10/26/2020   Head trauma 09/17/2020   Hemorrhoids 03/11/2022   Herpes genitalis in women 07/16/2015   History of adenomatous polyp of colon 05/21/2019   Overview:   03/31/17: Colonoscopy: nonadvanced adenoma, microscopic colitis, f/u 5 yrs, Murphy/GAP   Insomnia 01/23/2015   Lipoma 02/08/2015   Migraine headache with aura 02/12/2016   Myofascial pain dysfunction syndrome 08/22/2019   Myofascial pain dysfunction syndrome 08/22/2019   Non-alcoholic fatty liver disease 01/12/2018   Opioid dependence (HCC) 12/06/2020   OSA (obstructive sleep apnea) 02/24/2019   10/09/2018 - HST  - AHI 40.6    Osteopenia 12/06/2020   Rx alendronate 35 mg.   Pinguecula 12/06/2020   Postcoital bleeding 12/06/2020   Presbyopia 12/06/2020   Pulmonary hypertension    RLS (restless legs syndrome) 04/27/2015   Superficial fungus infection of skin 09/03/2021   Tremor, essential 12/11/2021   Type II diabetes mellitus (HCC) 09/24/2020   BP 119/84   Pulse 74   Ht 5\' 2"  (1.575 m)   Wt 188 lb 9.6 oz (85.5 kg)   SpO2 95%   BMI 34.50 kg/m   Opioid Risk Score:   Fall Risk Score:  `1  Depression screen PHQ 2/9     12/22/2022    1:51 PM 11/26/2022    3:09 PM 11/14/2022    2:22 PM 10/22/2022    8:20 AM 10/08/2022    1:19 PM 08/18/2022    3:18 PM 07/17/2022   12:28 PM  Depression screen PHQ 2/9  Decreased Interest 0 0 0 0 0 0   Down,  Depressed, Hopeless 0 0 0 0 0 0   PHQ - 2 Score 0 0 0 0 0 0   Altered sleeping   0 0   0  Tired, decreased energy   0 0   0  Change in appetite   0 0   0  Feeling bad or failure about yourself    0 0   0  Trouble concentrating   0 0   0  Moving slowly or fidgety/restless   0 0   0  Suicidal thoughts   0 0   0  PHQ-9 Score   0 0     Difficult doing work/chores   Not difficult at all Not difficult at all   Not difficult at all     Review of Systems  Constitutional: Negative.   HENT: Negative.    Eyes: Negative.   Respiratory: Negative.    Cardiovascular: Negative.   Gastrointestinal: Negative.   Endocrine: Negative.   Genitourinary: Negative.   Musculoskeletal:  Positive for back pain and gait problem.  Skin: Negative.   Allergic/Immunologic: Negative.   Hematological: Negative.   Psychiatric/Behavioral: Negative.    All other systems reviewed and are negative.      Objective:   Physical Exam        Assessment & Plan:   Patient is a 77 yr old female with hx of  CAD, pulmonary HTN- and DM- with CKD- Stage III here for right low back pain with sciatica  As well as upper back/neck pain/myofascial pain. Pt is here for chronic pain -4  Went over how using body to trick body's receptors- so in a few years, will switch to an Oxy product- when her body gets acclimated. Then will switch  back and forth to keep pain adequately controlled.   2.  Con't MS Contin 15 mg 2x/day- sent in next Rx- last refill  1 week ago. 01/13/23   3. Sent in for 02/10/23 the 2nd dose of MS contin-   4.  F/U in 3 months-  5. UDS due - any [facility] in the next 24 hours   I spent a total of 20   minutes on total care today- >50% coordination of care- due to   D/w pt about pain meds as discussed above and UDS

## 2022-12-23 DIAGNOSIS — I110 Hypertensive heart disease with heart failure: Secondary | ICD-10-CM | POA: Diagnosis not present

## 2022-12-23 DIAGNOSIS — S0003XD Contusion of scalp, subsequent encounter: Secondary | ICD-10-CM | POA: Diagnosis not present

## 2022-12-23 DIAGNOSIS — I482 Chronic atrial fibrillation, unspecified: Secondary | ICD-10-CM | POA: Diagnosis not present

## 2022-12-23 DIAGNOSIS — Z5181 Encounter for therapeutic drug level monitoring: Secondary | ICD-10-CM | POA: Diagnosis not present

## 2022-12-23 DIAGNOSIS — F319 Bipolar disorder, unspecified: Secondary | ICD-10-CM | POA: Diagnosis not present

## 2022-12-23 DIAGNOSIS — I509 Heart failure, unspecified: Secondary | ICD-10-CM | POA: Diagnosis not present

## 2022-12-23 DIAGNOSIS — E1140 Type 2 diabetes mellitus with diabetic neuropathy, unspecified: Secondary | ICD-10-CM | POA: Diagnosis not present

## 2022-12-23 DIAGNOSIS — G894 Chronic pain syndrome: Secondary | ICD-10-CM | POA: Diagnosis not present

## 2022-12-25 ENCOUNTER — Encounter: Payer: Self-pay | Admitting: Family Medicine

## 2022-12-25 ENCOUNTER — Ambulatory Visit: Payer: Medicare Other | Admitting: Family Medicine

## 2022-12-25 ENCOUNTER — Encounter: Payer: Self-pay | Admitting: Obstetrics & Gynecology

## 2022-12-25 ENCOUNTER — Other Ambulatory Visit (HOSPITAL_COMMUNITY)
Admission: RE | Admit: 2022-12-25 | Discharge: 2022-12-25 | Disposition: A | Discharge status: Home / Self Care | Payer: Medicare Other | Source: Ambulatory Visit | Attending: Obstetrics & Gynecology | Admitting: Obstetrics & Gynecology

## 2022-12-25 ENCOUNTER — Ambulatory Visit (INDEPENDENT_AMBULATORY_CARE_PROVIDER_SITE_OTHER): Payer: Medicare Other | Admitting: Obstetrics & Gynecology

## 2022-12-25 VITALS — BP 117/59 | HR 66 | Temp 97.3°F | Ht 62.0 in | Wt 192.8 lb

## 2022-12-25 VITALS — BP 132/80 | HR 82

## 2022-12-25 DIAGNOSIS — R5383 Other fatigue: Secondary | ICD-10-CM | POA: Diagnosis not present

## 2022-12-25 DIAGNOSIS — R9389 Abnormal findings on diagnostic imaging of other specified body structures: Secondary | ICD-10-CM | POA: Insufficient documentation

## 2022-12-25 DIAGNOSIS — R0602 Shortness of breath: Secondary | ICD-10-CM | POA: Diagnosis not present

## 2022-12-25 DIAGNOSIS — N950 Postmenopausal bleeding: Secondary | ICD-10-CM | POA: Diagnosis not present

## 2022-12-25 DIAGNOSIS — C541 Malignant neoplasm of endometrium: Secondary | ICD-10-CM | POA: Diagnosis not present

## 2022-12-25 LAB — VITAMIN B12

## 2022-12-25 LAB — CBC WITH DIFFERENTIAL/PLATELET
Basophils Absolute: 0 10*3/uL (ref 0.0–0.2)
Basos: 0 %
EOS (ABSOLUTE): 0.2 10*3/uL (ref 0.0–0.4)
Eos: 2 %
Hematocrit: 40.4 % (ref 34.0–46.6)
Hemoglobin: 13.2 g/dL (ref 11.1–15.9)
Immature Grans (Abs): 0.1 10*3/uL (ref 0.0–0.1)
Immature Granulocytes: 1 %
Lymphocytes Absolute: 2.7 10*3/uL (ref 0.7–3.1)
Lymphs: 27 %
MCH: 28.5 pg (ref 26.6–33.0)
MCHC: 32.7 g/dL (ref 31.5–35.7)
MCV: 87 fL (ref 79–97)
Monocytes Absolute: 0.6 10*3/uL (ref 0.1–0.9)
Monocytes: 6 %
Neutrophils Absolute: 6.3 10*3/uL (ref 1.4–7.0)
Neutrophils: 64 %
Platelets: 224 10*3/uL (ref 150–450)
RBC: 4.63 x10E6/uL (ref 3.77–5.28)
RDW: 14.1 % (ref 11.7–15.4)
WBC: 10 10*3/uL (ref 3.4–10.8)

## 2022-12-25 LAB — BRAIN NATRIURETIC PEPTIDE

## 2022-12-25 LAB — TSH+FREE T4

## 2022-12-25 MED ORDER — PREGABALIN 50 MG PO CAPS: 50.0000 mg | ORAL_CAPSULE | Freq: Every evening | ORAL | 1 refills | Status: DC

## 2022-12-25 MED ORDER — LANTUS SOLOSTAR 100 UNIT/ML ~~LOC~~ SOPN: 35.0000 [IU] | PEN_INJECTOR | Freq: Two times a day (BID) | SUBCUTANEOUS | 3 refills | Status: DC

## 2022-12-25 NOTE — Progress Notes (Signed)
GYN VISIT Patient name: Amber Stephenson MRN [id number]  Date of birth: 02-Nov-1945 Chief Complaint:   Procedure  History of Present Illness:   Amber ROCQUE is a 77 y.o. G20P3003 PM female being seen today for the following concerns:  -PMB: This has been an ongoing issue for the past year.  Bleeding seems to be intermittent, last episode about a week ago.  Denies pelvic or abdominal pain.   Recently seen by J.Griffin- treated for vulvar itching with gentian violet.   [place] reviewed: 11/25/22: 8x3x4cm uterus (44mL)  Endometrium 8.5mm  No LMP recorded. Patient is postmenopausal.    Review of Systems:   Pertinent items are noted in HPI Denies fever/chills, headaches, visual disturbances, fatigue, shortness of breath, chest pain, abdominal pain, vomiting unless otherwise stated above.  Pertinent History Reviewed:   Past Surgical History:  Procedure Laterality Date   BREAST REDUCTION SURGERY     EYE SURGERY Right    cateracts   HAMMER TOE SURGERY     LEFT HEART CATH AND CORONARY ANGIOGRAPHY N/A 02/03/2018   Procedure: LEFT HEART CATH AND CORONARY ANGIOGRAPHY;  Surgeon: [name], Peter M, MD;  Location: [facility] INVASIVE CV LAB;  Service: Cardiovascular;  Laterality: N/A;   REVERSE SHOULDER ARTHROPLASTY Right 08/19/2019   Procedure: REVERSE SHOULDER ARTHROPLASTY;  Surgeon: Beverely Low, MD;  Location: [facility];  Service: Orthopedics;  Laterality: Right;  interscalene block   SHOULDER SURGERY Right    "I BROKE MY SHOUDLER   THIGH SURGERY     "TO REMOVE A TUMOR "    Past Medical History:  Diagnosis Date   Acquired hammer toe 12/06/2020   Acute metabolic encephalopathy 06/27/2022   Allergic rhinitis 02/24/2019   Ambulatory dysfunction 04/23/2020   Atrial fibrillation with RVR (HCC) 05/19/2017   Back pain/sacroiliitis--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   Benign paroxysmal positional vertigo  due to bilateral vestibular disorder 05/20/2019   Bipolar 1 disorder (HCC) 01/23/2015   with GAD, benzo dependence   BPPV (benign paroxysmal positional vertigo) 05/20/2019   CAD (coronary artery disease)    Nonobstructive; Managed by Dr. Purvis Sheffield   Cardiomegaly 01/12/2018   CHF (congestive heart failure) 06/08/2022   Chronic back pain 01/04/2015   Chronic constipation 04/25/2020   Chronic diastolic heart failure (HCC) 05/30/2015   Chronic pain syndrome 08/22/2019   back pain, sacroiliitis   Chronic pain syndrome 08/22/2019   Chronic post-traumatic stress disorder (PTSD) 12/06/2020   Diabetic neuropathy (HCC) 02/06/2016   Dyslipidemia 09/24/2020   Dyspnea on exertion    Essential hypertension    Family history of coronary arteriosclerosis 05/30/2015   Functional diarrhea 10/26/2020   Head trauma 09/17/2020   Hemorrhoids 03/11/2022   Herpes genitalis in women 07/16/2015   History of adenomatous polyp of colon 05/21/2019   Overview:   03/31/17: Colonoscopy: nonadvanced adenoma, microscopic colitis, f/u 5 yrs, Murphy/GAP   Insomnia 01/23/2015   Lipoma 02/08/2015   Migraine headache with aura 02/12/2016   Myofascial pain dysfunction syndrome 08/22/2019   Myofascial pain dysfunction syndrome 08/22/2019   Non-alcoholic fatty liver disease 01/12/2018   Opioid dependence (HCC) 12/06/2020   OSA (obstructive sleep apnea) 02/24/2019   10/09/2018 - HST  - AHI 40.6    Osteopenia 12/06/2020   Rx alendronate 35 mg.   Pinguecula 12/06/2020   Postcoital bleeding 12/06/2020   Presbyopia 12/06/2020   Pulmonary hypertension    RLS (restless legs  syndrome) 04/27/2015   Superficial fungus infection of skin 09/03/2021   Tremor, essential 12/11/2021   Type II diabetes mellitus (HCC) 09/24/2020   Reviewed problem list, medications and allergies. Physical Assessment:   Vitals:   12/25/22 1114  BP: 132/80  Pulse: 82  There is no height or weight on file to calculate BMI.       Physical  Examination:   General appearance: alert, well appearing, and in no distress  Psych: mood appropriate, normal affect  Skin: warm & dry   Cardiovascular: normal heart rate noted  Respiratory: normal respiratory effort, no distress  Abdomen: obese, soft, non-tender   Pelvic: VULVA: normal appearing vulva with no masses, tenderness or lesions, VAGINA: normal appearing vagina with normal color and discharge, no lesions, CERVIX: normal appearing cervix without discharge or lesions, UTERUS: uterus is normal size, shape, consistency and nontender- exam limited due to body habitus **long speculum used  Extremities: no edema, no calf tenderness bilaterally  Chaperone: Faith Rogue    Endometrial Biopsy Procedure Note  Pre-operative Diagnosis: postmenopausal bleeding  Post-operative Diagnosis: same  Procedure Details  The risks (including infection, bleeding, pain, and uterine perforation) and benefits of the procedure were explained to the patient and Written informed consent was obtained.  Antibiotic prophylaxis against endocarditis was not indicated.   The patient was placed in the dorsal lithotomy position.  Bimanual exam showed the uterus to be in the neutral position.  A speculum inserted in the vagina, and the cervix prepped with betadine.     A single tooth tenaculum was applied to the anterior lip of the cervix for stabilization.  Os finder was used.  A Pipelle endometrial aspirator was used to sample the endometrium.  Sample was sent for pathologic examination.  Condition: Stable  Complications: None   Assessment & Plan:  1) PMB, Thickened endometrium -Reviewed US findings and discussed potential etiologies -recommendation for EMB- procedure as above -Next step pending results of pathology.   The patient was advised to call for any fever or for prolonged or severe pain or bleeding. She was advised to use OTC analgesics as needed for mild to moderate pain. She was advised to  avoid vaginal intercourse for 48 hours or until the bleeding has completely stopped.   Return in about 1 year (around 12/25/2023), or if symptoms worsen or fail to improve, for Annual.   [name], DO Attending Obstetrician & Gynecologist, [facility], [facility]

## 2022-12-25 NOTE — Progress Notes (Signed)
Subjective:  Patient ID: Amber Stephenson, female    DOB: 1946-02-09  Age: 77 y.o. MRN: [id number]  CC: Fatigue and Alopecia   HPI Amber Stephenson presents for one month of severe fatigue - Some loss of hair over the same time - thinning generally. She has no focal changes. Sx are constant. No energy, feeling weak. There is no focal neuro change.      12/22/2022    1:51 PM 11/26/2022    3:09 PM 11/14/2022    2:22 PM  Depression screen PHQ 2/9  Decreased Interest 0 0 0  Down, Depressed, Hopeless 0 0 0  PHQ - 2 Score 0 0 0  Altered sleeping   0  Tired, decreased energy   0  Change in appetite   0  Feeling bad or failure about yourself    0  Trouble concentrating   0  Moving slowly or fidgety/restless   0  Suicidal thoughts   0  PHQ-9 Score   0  Difficult doing work/chores   Not difficult at all    History Amber Stephenson has a past medical history of Acquired hammer toe (12/06/2020), Acute metabolic encephalopathy (06/27/2022), Allergic rhinitis (02/24/2019), Ambulatory dysfunction (04/23/2020), Atrial fibrillation with RVR (HCC) (05/19/2017), Back pain/sacroiliitis--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), Benign paroxysmal positional vertigo due to bilateral vestibular disorder (05/20/2019), Bipolar 1 disorder (HCC) (01/23/2015), BPPV (benign paroxysmal positional vertigo) (05/20/2019), CAD (coronary artery disease), Cardiomegaly (01/12/2018), CHF (congestive heart failure) (06/08/2022), Chronic back pain (01/04/2015), Chronic constipation (04/25/2020), Chronic diastolic heart failure (HCC) (52/84/1324), Chronic pain syndrome (08/22/2019), Chronic pain syndrome (08/22/2019), Chronic post-traumatic stress disorder (PTSD) (12/06/2020), Diabetic neuropathy (HCC) (02/06/2016), Dyslipidemia (09/24/2020), Dyspnea on exertion, Essential hypertension, Family history of coronary arteriosclerosis (05/30/2015), Functional diarrhea (10/26/2020), Head  trauma (09/17/2020), Hemorrhoids (03/11/2022), Herpes genitalis in women (07/16/2015), History of adenomatous polyp of colon (05/21/2019), Insomnia (01/23/2015), Lipoma (02/08/2015), Migraine headache with aura (02/12/2016), Myofascial pain dysfunction syndrome (08/22/2019), Myofascial pain dysfunction syndrome (08/22/2019), Non-alcoholic fatty liver disease (40/03/2724), Opioid dependence (HCC) (12/06/2020), OSA (obstructive sleep apnea) (02/24/2019), Osteopenia (12/06/2020), Pinguecula (12/06/2020), Postcoital bleeding (12/06/2020), Presbyopia (12/06/2020), Pulmonary hypertension, RLS (restless legs syndrome) (04/27/2015), Superficial fungus infection of skin (09/03/2021), Tremor, essential (12/11/2021), and Type II diabetes mellitus (HCC) (09/24/2020).   She has a past surgical history that includes THIGH SURGERY; Shoulder surgery (Right); Breast reduction surgery; Eye surgery (Right); Hammer toe surgery; LEFT HEART CATH AND CORONARY ANGIOGRAPHY (N/A, 02/03/2018); and Reverse shoulder arthroplasty (Right, 08/19/2019).   Her family history includes Alcohol abuse in her sister; Alzheimer's disease in her father; Diabetes in her brother, mother, and sister; Heart disease in her brother, mother, and sister; Mental illness in her brother; Stroke in her brother.She reports that she has never smoked. She has never used smokeless tobacco. She reports that she does not currently use alcohol. She reports that she does not use drugs.    ROS Review of Systems  Constitutional:  Positive for fever.  HENT:  Negative for congestion.   Eyes:  Negative for visual disturbance.  Respiratory:  Negative for shortness of breath.   Cardiovascular:  Negative for chest pain.  Gastrointestinal:  Negative for abdominal pain, constipation, diarrhea, nausea and vomiting.  Genitourinary:  Negative for difficulty urinating.  Musculoskeletal:  Negative for arthralgias and myalgias.  Neurological:  Positive for weakness. Negative  for headaches.  Psychiatric/Behavioral:  Negative for sleep disturbance.     Objective:  BP (!) 117/59   Pulse  66   Temp (!) 97.3 F (36.3 C)   Ht 5\' 2"  (1.575 m)   Wt 192 lb 12.8 oz (87.5 kg)   SpO2 93%   BMI 35.26 kg/m   BP Readings from Last 3 Encounters:  12/25/22 132/80  12/25/22 (!) 117/59  12/22/22 133/85    Wt Readings from Last 3 Encounters:  12/25/22 192 lb 12.8 oz (87.5 kg)  12/22/22 188 lb 8 oz (85.5 kg)  12/22/22 188 lb 9.6 oz (85.5 kg)     Physical Exam Constitutional:      General: She is not in acute distress.    Appearance: She is well-developed.  HENT:     Head: Normocephalic.     Right Ear: Tympanic membrane normal.     Left Ear: Tympanic membrane normal.     Nose: Nose normal.  Eyes:     Extraocular Movements: Extraocular movements intact.     Pupils: Pupils are equal, round, and reactive to light.  Cardiovascular:     Rate and Rhythm: Normal rate and regular rhythm.  Pulmonary:     Breath sounds: Normal breath sounds.  Musculoskeletal:        General: Normal range of motion.  Skin:    General: Skin is warm and dry.  Neurological:     Mental Status: She is alert and oriented to person, place, and time.     Coordination: Coordination normal.  Psychiatric:        Behavior: Behavior normal.        Thought Content: Thought content normal.       Assessment & Plan:   Amber Stephenson was seen today for fatigue and alopecia.  Diagnoses and all orders for this visit:  Fatigue, unspecified type -     TSH + free T4 -     CBC with Differential/Platelet -     Vitamin B12 -     Brain natriuretic peptide  Shortness of breath -     CBC with Differential/Platelet -     Brain natriuretic peptide  Other orders -     insulin glargine (LANTUS SOLOSTAR) 100 UNIT/ML Solostar Pen; Inject 35 Units into the skin 2 (two) times daily. -     pregabalin (LYRICA) 50 MG capsule; Take 1 capsule (50 mg total) by mouth at bedtime.       I have changed  Amber Stephenson's Lantus SoloStar and pregabalin. I am also having her maintain her Vitamin D (Cholecalciferol), albuterol, Prolia, naloxone, ARIPiprazole, nystatin ointment, ondansetron, Dexcom G7 Sensor, insulin lispro, Insulin Pen Needle, Acetaminophen (TYLENOL PO), senna-docusate, OXcarbazepine, nystatin, LORazepam, cyanocobalamin, metoprolol tartrate, apixaban, fluticasone-salmeterol, Emgality, allopurinol, Mounjaro, lisinopril, hydrochlorothiazide, doxepin, metFORMIN, morphine, and morphine. We will continue to administer denosumab.  Allergies as of 12/25/2022       Reactions   Iodine Anaphylaxis   Ivp Dye [iodinated Contrast Media] Anaphylaxis, Swelling   Throat closes   Latex Other (See Comments)   Latex tape pulls skin with it   Tizanidine Other (See Comments)   Weakness, goofy, bad dreams        Medication List        Accurate as of December 25, 2022 11:59 PM. If you have any questions, ask your nurse or doctor.          albuterol 108 (90 Base) MCG/ACT inhaler Commonly known as: VENTOLIN HFA Inhale 2 puffs into the lungs every 6 (six) hours as needed for wheezing or shortness of breath.   allopurinol 100 MG tablet Commonly  known as: ZYLOPRIM Take 100 mg by mouth daily.   apixaban 5 MG Tabs tablet Commonly known as: Eliquis Take 1 tablet (5 mg total) by mouth 2 (two) times daily.   ARIPiprazole 2 MG tablet Commonly known as: ABILIFY Take 2 mg by mouth at bedtime.   cyanocobalamin 1000 MCG tablet Commonly known as: VITAMIN B12 Take 1 tablet (1,000 mcg total) by mouth daily.   Dexcom G7 Sensor Misc Change sensor every 10 days What changed:  how much to take how to take this when to take this   doxepin 10 MG capsule Commonly known as: SINEQUAN Take 10 mg by mouth at bedtime.   Emgality 120 MG/ML Soaj Generic drug: Galcanezumab-gnlm INJECT ONCE A MONTH What changed:  how much to take how to take this when to take this   fluticasone-salmeterol 100-50  MCG/ACT Aepb Commonly known as: Advair Diskus Inhale 1 puff into the lungs 2 (two) times daily.   hydrochlorothiazide 12.5 MG capsule Commonly known as: MICROZIDE Take 1 capsule (12.5 mg total) by mouth daily.   insulin lispro 100 UNIT/ML KwikPen Commonly known as: HumaLOG KwikPen Max daily 50 units What changed:  how much to take how to take this when to take this additional instructions   Insulin Pen Needle 29G X Misc 1 Device by Does not apply route daily in the afternoon.   Lantus SoloStar 100 UNIT/ML Solostar Pen Generic drug: insulin glargine Inject 35 Units into the skin 2 (two) times daily. What changed: how much to take Changed by: Amber Stephenson   lisinopril 10 MG tablet Commonly known as: ZESTRIL Take 1 tablet (10 mg total) by mouth daily.   LORazepam 1 MG tablet Commonly known as: ATIVAN Take 0.5 tablets (0.5 mg total) by mouth 2 (two) times daily.   metFORMIN 500 MG 24 hr tablet Commonly known as: GLUCOPHAGE-XR Take 2 tablets (1,000 mg total) by mouth daily with breakfast.   metoprolol tartrate 50 MG tablet Commonly known as: LOPRESSOR Take 1 tablet (50 mg total) by mouth 2 (two) times daily.   morphine 15 MG 12 hr tablet Commonly known as: MS Contin Take 1 tablet (15 mg total) by mouth every 12 (twelve) hours. For chronic pain- can refill by 01/13/23   morphine 15 MG 12 hr tablet Commonly known as: MS Contin Take 1 tablet (15 mg total) by mouth every 12 (twelve) hours. Next fill 02/10/23   Mounjaro 5 MG/0.5ML Pen Generic drug: tirzepatide Inject 5 mg into the skin once a week.   naloxone 4 MG/0.1ML Liqd nasal spray kit Commonly known as: NARCAN Place 1 spray into the nose once.   nystatin ointment Commonly known as: MYCOSTATIN Apply 1 Application topically 2 (two) times daily. What changed:  when to take this reasons to take this   nystatin powder Commonly known as: MYCOSTATIN/NYSTOP Apply 1 Application topically as needed  (rash). What changed: Another medication with the same name was changed. Make sure you understand how and when to take each.   ondansetron 8 MG disintegrating tablet Commonly known as: ZOFRAN-ODT Take 1 tablet (8 mg total) by mouth every 6 (six) hours as needed for nausea or vomiting.   OXcarbazepine 150 MG tablet Commonly known as: TRILEPTAL Take 1 tablet (150 mg total) by mouth 2 (two) times daily.   pregabalin 50 MG capsule Commonly known as: LYRICA Take 1 capsule (50 mg total) by mouth at bedtime. What changed:  how much to take how to take this when to take this  additional instructions Changed by: Amber Stephenson   Prolia 60 MG/ML Sosy injection Generic drug: denosumab Inject 60 mg into the skin every 6 (six) months.   senna-docusate 8.6-50 MG tablet Commonly known as: Senokot-S Take 1 tablet by mouth at bedtime. What changed:  when to take this reasons to take this   TYLENOL PO Take 2 tablets by mouth as needed (pain/headache).   Vitamin D (Cholecalciferol) 25 MCG (1000 UT) Tabs Take 1,000 Units by mouth daily in the afternoon.         Follow-up: Return in about 6 weeks (around 02/05/2023).  Mechele Claude, M.D.

## 2022-12-28 ENCOUNTER — Encounter: Payer: Self-pay | Admitting: Family Medicine

## 2022-12-28 NOTE — Progress Notes (Signed)
Hello Amber Stephenson,  Your lab result is normal and/or stable.Some minor variations that are not significant are commonly marked abnormal, but do not represent any medical problem for you.  Best regards, Warren Stacks, M.D.

## 2022-12-30 ENCOUNTER — Other Ambulatory Visit: Payer: Self-pay | Admitting: Obstetrics & Gynecology

## 2022-12-30 ENCOUNTER — Encounter: Payer: Self-pay | Admitting: *Deleted

## 2022-12-30 ENCOUNTER — Telehealth: Payer: Self-pay

## 2022-12-30 DIAGNOSIS — F319 Bipolar disorder, unspecified: Secondary | ICD-10-CM | POA: Diagnosis not present

## 2022-12-30 DIAGNOSIS — S0003XD Contusion of scalp, subsequent encounter: Secondary | ICD-10-CM | POA: Diagnosis not present

## 2022-12-30 DIAGNOSIS — I509 Heart failure, unspecified: Secondary | ICD-10-CM | POA: Diagnosis not present

## 2022-12-30 DIAGNOSIS — I110 Hypertensive heart disease with heart failure: Secondary | ICD-10-CM | POA: Diagnosis not present

## 2022-12-30 DIAGNOSIS — I482 Chronic atrial fibrillation, unspecified: Secondary | ICD-10-CM | POA: Diagnosis not present

## 2022-12-30 DIAGNOSIS — E1140 Type 2 diabetes mellitus with diabetic neuropathy, unspecified: Secondary | ICD-10-CM | POA: Diagnosis not present

## 2022-12-30 DIAGNOSIS — C541 Malignant neoplasm of endometrium: Secondary | ICD-10-CM

## 2022-12-30 MED ORDER — NORETHINDRONE ACETATE 5 MG PO TABS
10.0000 mg | ORAL_TABLET | Freq: Every day | ORAL | 0 refills | Status: DC
Start: 2022-12-30 — End: 2023-05-14

## 2022-12-30 NOTE — Telephone Encounter (Signed)
Amber Stephenson would like for someone to call her about her test results.

## 2022-12-30 NOTE — Progress Notes (Signed)
Called pt with results of biopsy.  Referral created to gyn/oncology Since biopsy pt notes worsening bleeding Rx for Aygestin sent in  [name], DO Attending Obstetrician & Gynecologist, [facility] for [facility], [facility]

## 2022-12-31 ENCOUNTER — Telehealth: Payer: Self-pay | Admitting: Obstetrics & Gynecology

## 2022-12-31 NOTE — Telephone Encounter (Signed)
Pt states she is more questions about her diagnosis. Please call.

## 2023-01-05 ENCOUNTER — Encounter: Payer: Self-pay | Admitting: Psychiatry

## 2023-01-05 ENCOUNTER — Other Ambulatory Visit: Payer: Self-pay

## 2023-01-05 ENCOUNTER — Inpatient Hospital Stay: Payer: Medicare Other | Attending: Psychiatry | Admitting: Psychiatry

## 2023-01-05 VITALS — BP 123/80 | HR 62 | Temp 97.6°F | Resp 19 | Ht 62.0 in | Wt 190.0 lb

## 2023-01-05 DIAGNOSIS — I482 Chronic atrial fibrillation, unspecified: Secondary | ICD-10-CM

## 2023-01-05 DIAGNOSIS — Z7984 Long term (current) use of oral hypoglycemic drugs: Secondary | ICD-10-CM | POA: Diagnosis not present

## 2023-01-05 DIAGNOSIS — Z91041 Radiographic dye allergy status: Secondary | ICD-10-CM | POA: Insufficient documentation

## 2023-01-05 DIAGNOSIS — M461 Sacroiliitis, not elsewhere classified: Secondary | ICD-10-CM | POA: Insufficient documentation

## 2023-01-05 DIAGNOSIS — Z7951 Long term (current) use of inhaled steroids: Secondary | ICD-10-CM | POA: Insufficient documentation

## 2023-01-05 DIAGNOSIS — Z79899 Other long term (current) drug therapy: Secondary | ICD-10-CM | POA: Insufficient documentation

## 2023-01-05 DIAGNOSIS — Z79890 Hormone replacement therapy (postmenopausal): Secondary | ICD-10-CM | POA: Insufficient documentation

## 2023-01-05 DIAGNOSIS — J9610 Chronic respiratory failure, unspecified whether with hypoxia or hypercapnia: Secondary | ICD-10-CM | POA: Diagnosis not present

## 2023-01-05 DIAGNOSIS — D3610 Benign neoplasm of peripheral nerves and autonomic nervous system, unspecified: Secondary | ICD-10-CM | POA: Insufficient documentation

## 2023-01-05 DIAGNOSIS — I110 Hypertensive heart disease with heart failure: Secondary | ICD-10-CM | POA: Diagnosis not present

## 2023-01-05 DIAGNOSIS — M1990 Unspecified osteoarthritis, unspecified site: Secondary | ICD-10-CM | POA: Insufficient documentation

## 2023-01-05 DIAGNOSIS — G894 Chronic pain syndrome: Secondary | ICD-10-CM | POA: Diagnosis not present

## 2023-01-05 DIAGNOSIS — I4891 Unspecified atrial fibrillation: Secondary | ICD-10-CM | POA: Insufficient documentation

## 2023-01-05 DIAGNOSIS — I5032 Chronic diastolic (congestive) heart failure: Secondary | ICD-10-CM | POA: Insufficient documentation

## 2023-01-05 DIAGNOSIS — F319 Bipolar disorder, unspecified: Secondary | ICD-10-CM | POA: Diagnosis not present

## 2023-01-05 DIAGNOSIS — C541 Malignant neoplasm of endometrium: Secondary | ICD-10-CM | POA: Insufficient documentation

## 2023-01-05 DIAGNOSIS — I2510 Atherosclerotic heart disease of native coronary artery without angina pectoris: Secondary | ICD-10-CM | POA: Diagnosis not present

## 2023-01-05 DIAGNOSIS — Z794 Long term (current) use of insulin: Secondary | ICD-10-CM | POA: Insufficient documentation

## 2023-01-05 DIAGNOSIS — Z7901 Long term (current) use of anticoagulants: Secondary | ICD-10-CM | POA: Insufficient documentation

## 2023-01-05 DIAGNOSIS — G4733 Obstructive sleep apnea (adult) (pediatric): Secondary | ICD-10-CM | POA: Diagnosis not present

## 2023-01-05 DIAGNOSIS — J9611 Chronic respiratory failure with hypoxia: Secondary | ICD-10-CM | POA: Diagnosis not present

## 2023-01-05 DIAGNOSIS — E1136 Type 2 diabetes mellitus with diabetic cataract: Secondary | ICD-10-CM | POA: Diagnosis not present

## 2023-01-05 MED ORDER — PREDNISONE 50 MG PO TABS
ORAL_TABLET | ORAL | 0 refills | Status: DC
Start: 2023-01-05 — End: 2023-01-18

## 2023-01-05 MED ORDER — DIPHENHYDRAMINE HCL 50 MG PO TABS
50.0000 mg | ORAL_TABLET | Freq: Once | ORAL | 0 refills | Status: DC
Start: 2023-01-05 — End: 2023-01-30

## 2023-01-05 MED ORDER — LORAZEPAM 0.5 MG PO TABS
0.5000 mg | ORAL_TABLET | Freq: Once | ORAL | 0 refills | Status: AC
Start: 2023-01-05 — End: 2023-01-05

## 2023-01-05 NOTE — Patient Instructions (Addendum)
It was a pleasure to see you in clinic today. - For now, you can continue the hormone medication. - We discussed getting a CT scan first to make sure there is no evidence of cancer outside the uterus.  If that looks good, we will then proceed with the MRI to make sure hormonal therapy alone is adequate treatment. - We will also check with your cardiologist and pulmonologist to see if a D&C is safe to proceed with if we move forward with hormonal therapy.  We would do a D&C and place an IUD at the same time. - Return visit planned for after your imaging studies are complete.  FOR YOUR CT SCAN, PLAN ON TAKING PREDNISONE 50 MG 13 HOURS,(midnight) 7 HOURS,(6am)  AND 1 HOUR (12:00 noon) BEFORE THE SCAN. YOU WILL ALSO NEED TO TAKE BENADRYL 50 MG ONE HOUR BEFORE YOUR SCAN. (12:00 noon)  You can take one of your prescribed ativan before your scan if needed. YOU WILL NEED TO HAVE A DRIVER TO AND FROM YOUR SCAN.  Thank you very much for allowing me to provide care for you today.  I appreciate your confidence in choosing our Gynecologic Oncology team at [facility].  If you have any questions about your visit today please call our office or send [place] a MyChart message and we will get back to you as soon as possible.

## 2023-01-05 NOTE — Progress Notes (Signed)
GYNECOLOGIC ONCOLOGY NEW PATIENT CONSULTATION  Date of Service: 01/05/2023 Referring Provider: [name], DO   ASSESSMENT AND PLAN: Amber Stephenson is a 77 y.o. woman with PMHx notable for afib, CAD, CHF, chronic respiratory failure, chronic pain, OSA, T2DM, now with new diagnosis of FIGO grade 1 endometrioid endometrial cancer.  We reviewed the nature of endometrial cancer and its recommended surgical staging, including total hysterectomy, bilateral salpingo-oophorectomy, and lymph node assessment.  However, given patient's medical comorbidities, at this time not certain that patient is in optimal candidate for surgical management of endometrial cancer.  Also, given that patient has had on and off bleeding for 5 years and has had some systemic symptoms in the past month, it would be important to rule out metastatic disease at this time.  Recommend patient undergo a CT abdomen/pelvis to evaluate for any evidence of metastatic disease.  In lieu of surgical management, we did review that alternative treatments for endometrial cancer could include hormonal management, radiation treatment, or chemotherapy.  In order to determine if patient would be an appropriate candidate for hormonal therapy, recommend pelvic MRI to evaluate for evidence of myometrial invasion.  If CT scan as above shows evidence of metastatic disease, can defer pelvic MRI.  We reviewed the role of progesterone therapy in the treatment of endometrium confined endometrial cancer. We reviewed the 3 most studied options, to include levonorgesterol IUD (59mcg/d), oral medorxyprogesterone acetate 10mg  daily or cyclically 12-14 days per month, or oral megesterol acetate 40-200mg  per day.    She understands that all options have few side effects, most common being infrequent edema, GI disturbances, and thromboembolic events. Local progesterone through IUD may have a stronger effect on the endometrium with less systemic side effects.   Furthermore, studies demonstrate that 80-90% of women will have regression of their hyperplasia with progesterone use.    For now, patient will continue on the Aygestin.  If based on imaging above patient is an appropriate candidate for hormonal therapy alone, we would like to try to proceed with a dilation and curettage and IUD insertion.  We discussed that monitoring would be with an endometrial biopsy every 3 to 6 months following initiation of treatment until we have achieved 2-3 negative biopsies, after which time sampling frequency could decrease.  In anticipation of possible D&C procedure, we will go ahead and work towards clearance with her pulmonologist and cardiologist.  Patient will return to clinic following completion of her CT scan and MRI to review final treatment plan.  She was sent in prescriptions for prednisone and Benadryl to take prior to her CT scan for contrast allergy.  She was also sent 1 dose of Ativan for use at time of MRI.   A copy of this note was sent to the patient's referring provider.  [name], MD Gynecologic Oncology   Medical Decision Making I personally spent  TOTAL 70 minutes face-to-face and non-face-to-face in the care of this patient, which includes all pre, intra, and post visit time on the date of service.   ------------  CC: Endometrial cancer  HISTORY OF PRESENT ILLNESS:  Amber Stephenson is a 41 y.o. woman who is seen in consultation at the request of [name], DO for evaluation of FIGO grade 1 endometrial cancer.  Patient presented to her OB/GYN 12/25/2022 for concern of postmenopausal bleeding.  She reported that the bleeding has been going on for about a year and intermittent.  An ultrasound was performed on 11/25/2022 which showed an endometrial stripe  of 8.9 mm.  She underwent an endometrial biopsy which returned with endometrioid carcinoma, FIGO grade 1  Today, patient presents with her husband.  She reports on and off  spotting for the past 5 years.  It got heavier in the past year.  She denies bleeding currently.  She was started on Aygestin 10 mg daily about a week ago and the bleeding stopped after initiating that medication.  She otherwise reports about a 10 pound weight loss, abdominal bloating, and early satiety in the past month.  She denies change in bowel or bladder habits.  In terms of her cardiac history, patient does not have any coronary artery stents in place.  She follows with Dr. Armanda Magic and was last seen on 09/09/2022.  Her most recent echo was on 11/10/2022 and showed an LVEF of 65 to 70%.  In regards to her pulmonary history, patient reports that she uses oxygen at home as needed.  When she uses it it is on 2 L.  She reports using it about 4 times a week.  She follows with Rubye Oaks, NP with [facility] and was last seen on 09/01/2022.  In terms of her chronic pain, she sees Dr. Genice Rouge.  She is on MS Contin 15 mg twice daily.    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   Benign paroxysmal positional vertigo due to bilateral vestibular disorder 05/20/2019   Bipolar 1 disorder (HCC) 01/23/2015   with GAD, benzo dependence   BPPV (benign paroxysmal positional vertigo) 05/20/2019   CAD (coronary artery disease)    Nonobstructive; Managed by Dr. Purvis Sheffield   Cardiomegaly 01/12/2018   CHF (congestive heart failure) 06/08/2022   Chronic back pain 01/04/2015   Chronic constipation 04/25/2020   Chronic diastolic heart failure (HCC) 05/30/2015   Chronic pain syndrome 08/22/2019   back pain, sacroiliitis    Chronic pain syndrome 08/22/2019   Chronic post-traumatic stress disorder (PTSD) 12/06/2020   Diabetic neuropathy (HCC) 02/06/2016   Dyslipidemia 09/24/2020   Dyspnea on exertion    Essential hypertension    Family history of coronary arteriosclerosis 05/30/2015   Functional diarrhea 10/26/2020   Head trauma 09/17/2020   Hemorrhoids 03/11/2022   Herpes genitalis in women 07/16/2015   History of adenomatous polyp of colon 05/21/2019   Overview:   03/31/17: Colonoscopy: nonadvanced adenoma, microscopic colitis, f/u 5 yrs, [facility]   Insomnia 01/23/2015   Lipoma 02/08/2015   Migraine headache with aura 02/12/2016   Myofascial pain dysfunction syndrome 08/22/2019   Myofascial pain dysfunction syndrome 08/22/2019   Non-alcoholic fatty liver disease 01/12/2018   Opioid dependence (HCC) 12/06/2020   OSA (obstructive sleep apnea) 02/24/2019   10/09/2018 - HST  - AHI 40.6    Osteopenia 12/06/2020   Rx alendronate 35 mg.   Pinguecula 12/06/2020   Postcoital bleeding 12/06/2020   Presbyopia 12/06/2020   Pulmonary hypertension    RLS (restless legs syndrome) 04/27/2015   Superficial fungus infection of skin 09/03/2021   Tremor, essential 12/11/2021   Type II diabetes mellitus (HCC) 09/24/2020    PAST SURGICAL HISTORY: Past Surgical History:  Procedure Laterality Date   BREAST REDUCTION SURGERY  EYE SURGERY Right    cateracts   HAMMER TOE SURGERY     LEFT HEART CATH AND CORONARY ANGIOGRAPHY N/A 02/03/2018   Procedure: LEFT HEART CATH AND CORONARY ANGIOGRAPHY;  Surgeon: [name], Peter M, MD;  Location: [facility] INVASIVE CV LAB;  Service: Cardiovascular;  Laterality: N/A;   REVERSE SHOULDER ARTHROPLASTY Right 08/19/2019   Procedure: REVERSE SHOULDER ARTHROPLASTY;  Surgeon: Beverely Low, MD;  Location: [facility] ORS;  Service: Orthopedics;  Laterality: Right;  interscalene block   SHOULDER SURGERY Right    "I BROKE MY SHOUDLER   THIGH SURGERY     "TO REMOVE A TUMOR "    OB/GYN HISTORY: OB  History  Gravida Para Term Preterm AB Living  3 3 3     3   SAB IAB Ectopic Multiple Live Births          3    # Outcome Date GA Lbr Len/2nd Weight Sex Type Anes PTL Lv  3 Term      Vag-Spont   LIV  2 Term      Vag-Spont   LIV  1 Term      Vag-Spont   LIV      Age at menarche: 78 Age at menopause: 17 Hx of HRT: No Hx of STI: No Last pap: 2015 History of abnormal pap smears: Denies  SCREENING STUDIES:  Last mammogram:  12/2021 Last colonoscopy:  due now, last in 2017  MEDICATIONS:  Current Outpatient Medications:    diphenhydrAMINE (BENADRYL) 50 MG tablet, Take 1 tablet (50 mg total) by mouth once for 1 dose. Take 1 hour prior to CT scan, Disp: 1 tablet, Rfl: 0   LORazepam (ATIVAN) 0.5 MG tablet, Take 1 tablet (0.5 mg total) by mouth once for 1 dose. Take 30 minutes prior to MRI., Disp: 1 tablet, Rfl: 0   predniSONE (DELTASONE) 50 MG tablet, Take one tablet at 13, 7 and 1 hour prior to CT scan., Disp: 3 tablet, Rfl: 0   Acetaminophen (TYLENOL PO), Take 2 tablets by mouth as needed (pain/headache)., Disp: , Rfl:    albuterol (VENTOLIN HFA) 108 (90 Base) MCG/ACT inhaler, Inhale 2 puffs into the lungs every 6 (six) hours as needed for wheezing or shortness of breath., Disp: 1 each, Rfl: 0   allopurinol (ZYLOPRIM) 100 MG tablet, Take 100 mg by mouth daily., Disp: , Rfl:    apixaban (ELIQUIS) 5 MG TABS tablet, Take 1 tablet (5 mg total) by mouth 2 (two) times daily., Disp: 180 tablet, Rfl: 3   ARIPiprazole (ABILIFY) 2 MG tablet, Take 2 mg by mouth at bedtime., Disp: , Rfl:    Continuous Blood Gluc Sensor (DEXCOM G7 SENSOR) MISC, Change sensor every 10 days (Patient taking differently: 1 each by Other route See admin instructions. Change sensor every 10 days), Disp: 9 each, Rfl: 3   cyanocobalamin (VITAMIN B12) 1000 MCG tablet, Take 1 tablet (1,000 mcg total) by mouth daily., Disp: 30 tablet, Rfl: 1   denosumab (PROLIA) 60 MG/ML SOSY injection, Inject 60 mg into the skin every 6 (six)  months., Disp: 1 mL, Rfl: 1   doxepin (SINEQUAN) 10 MG capsule, Take 10 mg by mouth at bedtime., Disp: , Rfl:    fluticasone-salmeterol (ADVAIR DISKUS) 100-50 MCG/ACT AEPB, Inhale 1 puff into the lungs 2 (two) times daily., Disp: 1 each, Rfl: 5   Galcanezumab-gnlm (EMGALITY) 120 MG/ML SOAJ, INJECT ONCE A MONTH (Patient taking differently: Inject 120 mg into the skin See admin instructions. INJECT ONCE A MONTH), Disp: 1  mL, Rfl: 3   hydrochlorothiazide (MICROZIDE) 12.5 MG capsule, Take 1 capsule (12.5 mg total) by mouth daily., Disp: 90 capsule, Rfl: 3   insulin glargine (LANTUS SOLOSTAR) 100 UNIT/ML Solostar Pen, Inject 35 Units into the skin 2 (two) times daily., Disp: 60 mL, Rfl: 3   insulin lispro (HUMALOG KWIKPEN) 100 UNIT/ML KwikPen, Max daily 50 units (Patient taking differently: Inject 4-8 Units into the skin 3 (three) times daily.), Disp: 45 mL, Rfl: 6   Insulin Pen Needle 29G X MISC, 1 Device by Does not apply route daily in the afternoon., Disp: 400 each, Rfl: 3   lisinopril (ZESTRIL) 10 MG tablet, Take 1 tablet (10 mg total) by mouth daily., Disp: 90 tablet, Rfl: 3   LORazepam (ATIVAN) 1 MG tablet, Take 0.5 tablets (0.5 mg total) by mouth 2 (two) times daily., Disp: 90 tablet, Rfl: 0   metFORMIN (GLUCOPHAGE-XR) 500 MG 24 hr tablet, Take 2 tablets (1,000 mg total) by mouth daily with breakfast., Disp: 180 tablet, Rfl: 3   metoprolol tartrate (LOPRESSOR) 50 MG tablet, Take 1 tablet (50 mg total) by mouth 2 (two) times daily., Disp: 60 tablet, Rfl: 3   morphine (MS CONTIN) 15 MG 12 hr tablet, Take 1 tablet (15 mg total) by mouth every 12 (twelve) hours. For chronic pain- can refill by 01/13/23, Disp: 60 tablet, Rfl: 0   morphine (MS CONTIN) 15 MG 12 hr tablet, Take 1 tablet (15 mg total) by mouth every 12 (twelve) hours. Next fill 02/10/23, Disp: 60 tablet, Rfl: 0   naloxone (NARCAN) nasal spray 4 mg/0.1 mL, Place 1 spray into the nose once., Disp: , Rfl:    norethindrone (AYGESTIN) 5 MG  tablet, Take 2 tablets (10 mg total) by mouth daily., Disp: 60 tablet, Rfl: 0   nystatin (MYCOSTATIN/NYSTOP) powder, Apply 1 Application topically as needed (rash)., Disp: 15 g, Rfl: 2   nystatin ointment (MYCOSTATIN), Apply 1 Application topically 2 (two) times daily. (Patient taking differently: Apply 1 Application topically as needed (rash).), Disp: 30 g, Rfl: 3   ondansetron (ZOFRAN-ODT) 8 MG disintegrating tablet, Take 1 tablet (8 mg total) by mouth every 6 (six) hours as needed for nausea or vomiting., Disp: 20 tablet, Rfl: 1   OXcarbazepine (TRILEPTAL) 150 MG tablet, Take 1 tablet (150 mg total) by mouth 2 (two) times daily., Disp: 60 tablet, Rfl: 0   pregabalin (LYRICA) 50 MG capsule, Take 1 capsule (50 mg total) by mouth at bedtime., Disp: 30 capsule, Rfl: 1   senna-docusate (SENOKOT-S) 8.6-50 MG tablet, Take 1 tablet by mouth at bedtime. (Patient taking differently: Take 1 tablet by mouth as needed for moderate constipation.), Disp: 30 tablet, Rfl: 0   tirzepatide (MOUNJARO) 5 MG/0.5ML Pen, Inject 5 mg into the skin once a week., Disp: 2 mL, Rfl: 2   Vitamin D, Cholecalciferol, 25 MCG (1000 UT) TABS, Take 1,000 Units by mouth daily in the afternoon., Disp: , Rfl:   Current Facility-Administered Medications:    denosumab (PROLIA) injection 60 mg, 60 mg, Subcutaneous, Once, Stacks, Warren, MD  Facility-Administered Medications Ordered in Other Visits:    bupivacaine-epinephrine (MARCAINE W/ EPI) 0.5% -1:200000 injection, , , Anesthesia Intra-op, Shelton Silvas, MD, 12 mL at 01/21/19 0935  ALLERGIES: Allergies  Allergen Reactions   Iodine Anaphylaxis   Ivp Dye [Iodinated Contrast Media] Anaphylaxis and Swelling    Throat closes    Latex Other (See Comments)    Latex tape pulls skin with it   Tizanidine Other (See Comments)  Weakness, goofy, bad dreams    FAMILY HISTORY: Family History  Problem Relation Age of Onset   Diabetes Mother    Heart disease Mother    Alzheimer's  disease Father    Heart disease Sister        CABG   Diabetes Sister    Alcohol abuse Sister    Stroke Brother    Heart disease Brother    Mental illness Brother    Diabetes Brother    Breast cancer Neg Hx    Ovarian cancer Neg Hx    Colon cancer Neg Hx    Endometrial cancer Neg Hx     SOCIAL HISTORY: Social History   Socioeconomic History   Marital status: Married    Spouse name: Dwight   Number of children: 3   Years of education: 15   Highest education level: Associate degree: academic program  Occupational History   Occupation: Retired    Comment: Chief Financial Officer  Tobacco Use   Smoking status: Never   Smokeless tobacco: Never  Vaping Use   Vaping status: Never Used  Substance and Sexual Activity   Alcohol use: Not Currently    Alcohol/week: 0.0 standard drinks of alcohol   Drug use: No   Sexual activity: Yes    Partners: Male    Birth control/protection: Post-menopausal  Other Topics Concern   Not on file  Social History Narrative   Lives at home with husband.    They have 3 children who live away - [place], [place], and [place].   She is from [place] and most of her family lives there.   Her husbands's family live nearby   Right handed.   Social Determinants of Health   Financial Resource Strain: Low Risk  (06/30/2022)   Overall Financial Resource Strain (CARDIA)    Difficulty of Paying Living Expenses: Not hard at all  Food Insecurity: No Food Insecurity (11/13/2022)   Hunger Vital Sign    Worried About Running Out of Food in the Last Year: Never true    Ran Out of Food in the Last Year: Never true  Transportation Needs: No Transportation Needs (11/13/2022)   PRAPARE - Administrator, Civil Service (Medical): No    Lack of Transportation (Non-Medical): No  Physical Activity: Inactive (12/13/2021)   Exercise Vital Sign    Days of Exercise per Week: 0 days    Minutes of Exercise per Session: 0 min  Stress: No Stress Concern Present  (12/13/2021)   [employer] of Occupational Health - Occupational Stress Questionnaire    Feeling of Stress : Not at all  Social Connections: Socially Integrated (12/13/2021)   Social Connection and Isolation Panel [NHANES]    Frequency of Communication with Friends and Family: More than three times a week    Frequency of Social Gatherings with Friends and Family: Twice a week    Attends Religious Services: More than 4 times per year    Active Member of [employer] or Organizations: Yes    Attends Banker Meetings: More than 4 times per year    Marital Status: Married  Catering manager Violence: Patient Declined (11/09/2022)   Humiliation, Afraid, Rape, and Kick questionnaire    Fear of Current or Ex-Partner: Patient declined    Emotionally Abused: Patient declined    Physically Abused: Patient declined    Sexually Abused: Patient declined    REVIEW OF SYSTEMS: New patient intake form was reviewed.  Complete 10-system review is negative except for the  following: Urinary frequency, joint pain, rash, easy bruising/bleeding, wheezing, abdominal tension, back pain, vaginal bleeding, dizziness, hot flashes, itching, problem walking, confusion  PHYSICAL EXAM: BP 123/80 (BP Location: Left Arm, Patient Position: Sitting)   Pulse 62   Temp 97.6 F (36.4 C) (Oral)   Resp 19   Ht 5\' 2"  (1.575 m)   Wt 190 lb (86.2 kg)   SpO2 96%   BMI 34.75 kg/m  Constitutional: No acute distress. Neuro/Psych: Alert, oriented.  Head and Neck: Normocephalic, atraumatic. Neck symmetric without masses. Sclera anicteric.  Respiratory: Normal work of breathing.  Occasional shortness of breath with prolonged speaking. Clear to auscultation bilaterally. Cardiovascular: irregularly irregular, no murmurs, rubs, or gallops. Abdomen: Normoactive bowel sounds.  Rounded abdomen.  Soft, nondistended, mild tenderness to palpation. Extremities: Grossly normal range of motion. Warm, well perfused. No edema  bilaterally. Skin: No rashes or lesions. Lymphatic: No cervical, supraclavicular, or inguinal adenopathy. Genitourinary: External genitalia without lesions. Urethral meatus without lesions or prolapse. On speculum exam, vagina and cervix without lesions. Bimanual exam reveals normal cervix.  Uterus and adnexa difficult to palpate due to body habitus. Exam chaperoned by Warner Mccreedy, NP   LABORATORY AND RADIOLOGIC DATA: Outside medical records were reviewed to synthesize the above history, along with the history and physical obtained during the visit.  Outside laboratory, pathology, and imaging reports were reviewed, with pertinent results below.  I personally reviewed the outside images.  WBC  Date Value Ref Range Status  12/25/2022 10.0 3.4 - 10.8 x10E3/uL Final  11/14/2022 8.8 4.0 - 10.5 K/uL Final   Hemoglobin  Date Value Ref Range Status  12/25/2022 13.2 11.1 - 15.9 g/dL Final   Hematocrit  Date Value Ref Range Status  12/25/2022 40.4 34.0 - 46.6 % Final   Platelets  Date Value Ref Range Status  12/25/2022 224 150 - 450 x10E3/uL Final   Magnesium  Date Value Ref Range Status  08/16/2022 2.3 1.7 - 2.4 mg/dL Final    Comment:    Performed at [facility], [address], [place], [place] [address]   Creatinine, Ser  Date Value Ref Range Status  11/26/2022 0.77 0.57 - 1.00 mg/dL Final   AST  Date Value Ref Range Status  11/26/2022 17 0 - 40 IU/L Final   ALT  Date Value Ref Range Status  11/26/2022 11 0 - 32 IU/L Final   Surgical pathology (12/29/22): FINAL MICROSCOPIC DIAGNOSIS:   A. ENODMETRIUM, BIOPSY:  - Endometrioid carcinoma, FIGO grade 1 (see comment)   COMMENT:   This case was reviewed with Dr. Wenda Low who agrees with the above  diagnosis.    US PELVIS (TRANSABDOMINAL ONLY) 11/25/2022  Narrative CLINICAL DATA:  Postmenopausal bleeding  EXAM: TRANSABDOMINAL ULTRASOUND OF PELVIS  TECHNIQUE: Transabdominal ultrasound examination  of the pelvis was performed including evaluation of the uterus, ovaries, adnexal regions, and pelvic cul-de-sac.  COMPARISON:  None Available.  FINDINGS: Patient was unable to tolerate transvaginal examination limiting the study.  Uterus  Measurements: 8.1 x 2.7 x 3.9 cm = volume: 44.2 mL. No fibroids or other mass visualized.  Endometrium  Thickness: 8.9 mm. There is no demonstrable increased vascularity in the endometrium.  Right ovary  Measurements: 2.6 by 1.5 x 1.9 cm = volume: 3.7 mL. Not optimally visualized.  Left ovary  Not sonographically visualized.  Other findings:  No abnormal free fluid.  IMPRESSION: Uterus is not enlarged. Endometrial stripe is prominent measuring 8.9 mm. In view of patient's age, short-term follow-up pelvic sonogram along  with sonohysterogram or MRI may be considered.  Left ovary is not sonographically visualized. No dominant adnexal masses are seen. There is no free fluid in pelvis.   Electronically Signed By: Ernie Avena M.D. On: 11/28/2022 16:20

## 2023-01-06 ENCOUNTER — Other Ambulatory Visit: Payer: Self-pay

## 2023-01-06 DIAGNOSIS — I4821 Permanent atrial fibrillation: Secondary | ICD-10-CM

## 2023-01-06 MED ORDER — METOPROLOL TARTRATE 50 MG PO TABS
50.0000 mg | ORAL_TABLET | Freq: Two times a day (BID) | ORAL | 2 refills | Status: DC
Start: 2023-01-06 — End: 2023-12-09

## 2023-01-06 NOTE — Telephone Encounter (Signed)
Pt's medication was sent to pt's pharmacy as requested. Confirmation received.  °

## 2023-01-07 ENCOUNTER — Telehealth: Payer: Self-pay

## 2023-01-07 ENCOUNTER — Encounter: Payer: Self-pay | Admitting: Family Medicine

## 2023-01-07 ENCOUNTER — Ambulatory Visit (INDEPENDENT_AMBULATORY_CARE_PROVIDER_SITE_OTHER): Payer: Medicare Other | Admitting: Family Medicine

## 2023-01-07 VITALS — BP 126/75 | HR 75 | Temp 97.9°F | Ht 62.0 in | Wt 191.0 lb

## 2023-01-07 DIAGNOSIS — C541 Malignant neoplasm of endometrium: Secondary | ICD-10-CM

## 2023-01-07 DIAGNOSIS — L659 Nonscarring hair loss, unspecified: Secondary | ICD-10-CM

## 2023-01-07 NOTE — Telephone Encounter (Signed)
Pts last OV was 09/09/22 with Dr. Mayford Knife.     Pre-operative Risk Assessment    Patient Name: Amber Stephenson  DOB: [birth year] MRN: [id number]      Request for Surgical Clearance    Procedure:   Surgical Staging, including total hysterectomy, bilateral salpingo-ophonectomy and lymph node assessment  Date of Surgery:  Clearance TBD                                 Surgeon:  Dr. [name] Surgeon's Group or Practice Name:  [facility] Phone number:  [phone] Fax number:  [phone]   Type of Clearance Requested:   - Medical  - Pharmacy:  Hold Apixaban (Eliquis) pt will need instructions on when/if to hold   Type of Anesthesia:  Not Indicated   Additional requests/questions:    Wynetta Fines   01/07/2023, 2:28 PM

## 2023-01-07 NOTE — Telephone Encounter (Signed)
Faxed over surgery optimization form to Dr Armanda Magic. Faxed to [phone].Marland Kitchen

## 2023-01-07 NOTE — Telephone Encounter (Signed)
Faxed over surgical optimization form to Rubye Oaks, NP which is patient's pulmonology office.  Faxed to [phone]

## 2023-01-07 NOTE — Progress Notes (Signed)
Subjective:  Patient ID: Amber Stephenson, female    DOB: Nov 24, 1945  Age: 77 y.o. MRN: [id number]  CC: Cancer (Discuss new diagnosis)   HPI Amber Stephenson presents for hair falling out. It's all over her house. Onset 6 weeks ago  Dx with endometrial Ca. Surgical risk is high due to her many ailments. Now under consideration for chemo &/or radiation, she states.      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), Benign paroxysmal positional vertigo due to bilateral vestibular disorder (05/20/2019), Bipolar 1 disorder (HCC) (01/23/2015), BPPV (benign paroxysmal positional vertigo) (05/20/2019), CAD (coronary artery disease), Cardiomegaly (01/12/2018), CHF (congestive heart failure) (06/08/2022), Chronic back pain (01/04/2015), Chronic constipation (04/25/2020), Chronic diastolic heart failure (HCC) (57/84/6962), Chronic pain syndrome (08/22/2019), Chronic pain syndrome (08/22/2019), Chronic post-traumatic stress disorder (PTSD) (12/06/2020), Diabetic neuropathy (HCC) (02/06/2016), Dyslipidemia (09/24/2020), Dyspnea on exertion, Essential hypertension, Family history of coronary arteriosclerosis (05/30/2015), Functional diarrhea (10/26/2020), Head trauma (09/17/2020), Hemorrhoids (03/11/2022), Herpes genitalis in women (07/16/2015), History of adenomatous polyp of colon (05/21/2019), Insomnia (01/23/2015), Lipoma (02/08/2015), Migraine headache with aura (02/12/2016), Myofascial pain dysfunction syndrome  (08/22/2019), Myofascial pain dysfunction syndrome (08/22/2019), Non-alcoholic fatty liver disease (95/28/4132), Opioid dependence (HCC) (12/06/2020), OSA (obstructive sleep apnea) (02/24/2019), Osteopenia (12/06/2020), Pinguecula (12/06/2020), Postcoital bleeding (12/06/2020), Presbyopia (12/06/2020), Pulmonary hypertension, RLS (restless legs syndrome) (04/27/2015), Superficial fungus infection of skin (09/03/2021), Tremor, essential (12/11/2021), and Type II diabetes mellitus (HCC) (09/24/2020).   She has a past surgical history that includes THIGH SURGERY; Shoulder surgery (Right); Breast reduction surgery; Eye surgery (Right); Hammer toe surgery; LEFT HEART CATH AND CORONARY ANGIOGRAPHY (N/A, 02/03/2018); and Reverse shoulder arthroplasty (Right, 08/19/2019).   Her family history includes Alcohol abuse in her sister; Alzheimer's disease in her father; Diabetes in her brother, mother, and sister; Heart disease in her brother, mother, and sister; Mental illness in her brother; Stroke in her brother.She reports that she has never smoked. She has never used smokeless tobacco. She reports that she does not currently use alcohol. She reports that she does not use drugs.    ROS Review of Systems  Constitutional: Negative.   HENT: Negative.    Eyes:  Negative for visual disturbance.  Respiratory:  Positive for shortness of breath.   Cardiovascular:  Negative for chest pain.  Gastrointestinal:  Negative for abdominal pain.  Musculoskeletal:  Negative for arthralgias.    Objective:  BP 126/75   Pulse 75   Temp 97.9 F (36.6 C)   Ht 5\' 2"  (1.575 m)   Wt 191 lb (86.6 kg)   SpO2 95%   BMI 34.93 kg/m   BP Readings from Last 3 Encounters:  01/07/23 126/75  01/05/23 123/80  12/25/22 132/80    Wt Readings from Last 3 Encounters:  01/07/23 191 lb (86.6 kg)  01/05/23 190 lb (86.2 kg)  12/25/22 192 lb 12.8 oz (87.5 kg)     Physical Exam Constitutional:      General: She is not in acute  distress.    Appearance: She is  well-developed.  Cardiovascular:     Rate and Rhythm: Normal rate and regular rhythm.  Pulmonary:     Breath sounds: Normal breath sounds.  Musculoskeletal:        General: Normal range of motion.  Skin:    General: Skin is warm and dry.     Comments: Hair is thin. No areata  Neurological:     Mental Status: She is alert and oriented to person, place, and time.       Assessment & Plan:   Amber Stephenson was seen today for cancer.  Diagnoses and all orders for this visit:  Adenocarcinoma of endometrium (HCC)  Alopecia   Needs to see Derm for hair loss tx and continue with GYN for Ca tx.    I am having Amber Stephenson. Amber Stephenson maintain her Vitamin D (Cholecalciferol), albuterol, Prolia, naloxone, ARIPiprazole, nystatin ointment, ondansetron, Dexcom G7 Sensor, insulin lispro, Insulin Pen Needle, Acetaminophen (TYLENOL PO), senna-docusate, OXcarbazepine, nystatin, LORazepam, cyanocobalamin, apixaban, fluticasone-salmeterol, Emgality, allopurinol, Mounjaro, lisinopril, hydrochlorothiazide, doxepin, metFORMIN, morphine, morphine, Lantus SoloStar, pregabalin, norethindrone, predniSONE, diphenhydrAMINE, and metoprolol tartrate. We will continue to administer denosumab.  Allergies as of 01/07/2023       Reactions   Iodine Anaphylaxis   Ivp Dye [iodinated Contrast Media] Anaphylaxis, Swelling   Throat closes   Latex Other (See Comments)   Latex tape pulls skin with it   Tizanidine Other (See Comments)   Weakness, goofy, bad dreams        Medication List        Accurate as of January 07, 2023  4:33 PM. If you have any questions, ask your nurse or doctor.          albuterol 108 (90 Base) MCG/ACT inhaler Commonly known as: VENTOLIN HFA Inhale 2 puffs into the lungs every 6 (six) hours as needed for wheezing or shortness of breath.   allopurinol 100 MG tablet Commonly known as: ZYLOPRIM Take 100 mg by mouth daily.   apixaban 5 MG Tabs tablet Commonly  known as: Eliquis Take 1 tablet (5 mg total) by mouth 2 (two) times daily.   ARIPiprazole 2 MG tablet Commonly known as: ABILIFY Take 2 mg by mouth at bedtime.   cyanocobalamin 1000 MCG tablet Commonly known as: VITAMIN B12 Take 1 tablet (1,000 mcg total) by mouth daily.   Dexcom G7 Sensor Misc Change sensor every 10 days What changed:  how much to take how to take this when to take this   diphenhydrAMINE 50 MG tablet Commonly known as: BENADRYL Take 1 tablet (50 mg total) by mouth once for 1 dose. Take 1 hour prior to CT scan   doxepin 10 MG capsule Commonly known as: SINEQUAN Take 10 mg by mouth at bedtime.   Emgality 120 MG/ML Soaj Generic drug: Galcanezumab-gnlm INJECT ONCE A MONTH What changed:  how much to take how to take this when to take this   fluticasone-salmeterol 100-50 MCG/ACT Aepb Commonly known as: Advair Diskus Inhale 1 puff into the lungs 2 (two) times daily.   hydrochlorothiazide 12.5 MG capsule Commonly known as: MICROZIDE Take 1 capsule (12.5 mg total) by mouth daily.   insulin lispro 100 UNIT/ML KwikPen Commonly known as: HumaLOG KwikPen Max daily 50 units What changed:  how much to take how to take this when to take this additional instructions   Insulin Pen Needle 29G X Misc 1 Device by Does not apply route daily in the afternoon.   Lantus SoloStar 100 UNIT/ML Solostar Pen Generic drug: insulin  glargine Inject 35 Units into the skin 2 (two) times daily.   lisinopril 10 MG tablet Commonly known as: ZESTRIL Take 1 tablet (10 mg total) by mouth daily.   LORazepam 1 MG tablet Commonly known as: ATIVAN Take 0.5 tablets (0.5 mg total) by mouth 2 (two) times daily.   metFORMIN 500 MG 24 hr tablet Commonly known as: GLUCOPHAGE-XR Take 2 tablets (1,000 mg total) by mouth daily with breakfast.   metoprolol tartrate 50 MG tablet Commonly known as: LOPRESSOR Take 1 tablet (50 mg total) by mouth 2 (two) times daily.   morphine  15 MG 12 hr tablet Commonly known as: MS Contin Take 1 tablet (15 mg total) by mouth every 12 (twelve) hours. For chronic pain- can refill by 01/13/23   morphine 15 MG 12 hr tablet Commonly known as: MS Contin Take 1 tablet (15 mg total) by mouth every 12 (twelve) hours. Next fill 02/10/23   Mounjaro 5 MG/0.5ML Pen Generic drug: tirzepatide Inject 5 mg into the skin once a week.   naloxone 4 MG/0.1ML Liqd nasal spray kit Commonly known as: NARCAN Place 1 spray into the nose once.   norethindrone 5 MG tablet Commonly known as: AYGESTIN Take 2 tablets (10 mg total) by mouth daily.   nystatin ointment Commonly known as: MYCOSTATIN Apply 1 Application topically 2 (two) times daily. What changed:  when to take this reasons to take this   nystatin powder Commonly known as: MYCOSTATIN/NYSTOP Apply 1 Application topically as needed (rash). What changed: Another medication with the same name was changed. Make sure you understand how and when to take each.   ondansetron 8 MG disintegrating tablet Commonly known as: ZOFRAN-ODT Take 1 tablet (8 mg total) by mouth every 6 (six) hours as needed for nausea or vomiting.   OXcarbazepine 150 MG tablet Commonly known as: TRILEPTAL Take 1 tablet (150 mg total) by mouth 2 (two) times daily.   predniSONE 50 MG tablet Commonly known as: DELTASONE Take one tablet at 13, 7 and 1 hour prior to CT scan.   pregabalin 50 MG capsule Commonly known as: LYRICA Take 1 capsule (50 mg total) by mouth at bedtime.   Prolia 60 MG/ML Sosy injection Generic drug: denosumab Inject 60 mg into the skin every 6 (six) months.   senna-docusate 8.6-50 MG tablet Commonly known as: Senokot-S Take 1 tablet by mouth at bedtime. What changed:  when to take this reasons to take this   TYLENOL PO Take 2 tablets by mouth as needed (pain/headache).   Vitamin D (Cholecalciferol) 25 MCG (1000 UT) Tabs Take 1,000 Units by mouth daily in the afternoon.          Follow-up: Return if symptoms worsen or fail to improve.  Mechele Claude, M.D.

## 2023-01-08 ENCOUNTER — Telehealth: Payer: Self-pay

## 2023-01-08 NOTE — Telephone Encounter (Signed)
ATC X1 LVM for patient to call the office back  Received surgical clearance request for patient. She will need OV to be cleared for surgery. Please schedule with Tammy or Dr. Wynona Neat

## 2023-01-08 NOTE — Telephone Encounter (Signed)
Pharmacy please advise on holding Eliquis prior to Surgical Staging, including total hysterectomy, bilateral salpingo-ophonectomy and lymph node assessment   scheduled for date TBD. Thank you.

## 2023-01-09 ENCOUNTER — Telehealth: Payer: Self-pay

## 2023-01-09 DIAGNOSIS — M109 Gout, unspecified: Secondary | ICD-10-CM | POA: Diagnosis not present

## 2023-01-09 NOTE — Telephone Encounter (Signed)
   Name: Amber Stephenson  DOB: [birth year]  MRN: [id number]  Primary Cardiologist: Armanda Magic, MD   Preoperative team, please contact this patient and set up a phone call appointment for further preoperative risk assessment. Please obtain consent and complete medication review. Thank you for your help. Last seen 09/09/2022  CrCl 87mL/min using adjusted body weight Platelet count 215K   Per office protocol, patient can hold Eliquis for 2-3 days prior to procedure.      Joni Reining, NP 01/09/2023, 11:06 AM [facility]

## 2023-01-09 NOTE — Telephone Encounter (Signed)
  Patient Consent for Virtual Visit         Amber Stephenson has provided verbal consent on 01/09/2023 for a virtual visit (video or telephone).   CONSENT FOR VIRTUAL VISIT FOR:  Amber Stephenson  By participating in this virtual visit I agree to the following:  I hereby voluntarily request, consent and authorize [facility] and its employed or contracted physicians, physician assistants, nurse practitioners or other licensed health care professionals (the Practitioner), to provide me with telemedicine health care services (the "Services") as deemed necessary by the treating Practitioner. I acknowledge and consent to receive the Services by the Practitioner via telemedicine. I understand that the telemedicine visit will involve communicating with the Practitioner through live audiovisual communication technology and the disclosure of certain medical information by electronic transmission. I acknowledge that I have been given the opportunity to request an in-person assessment or other available alternative prior to the telemedicine visit and am voluntarily participating in the telemedicine visit.  I understand that I have the right to withhold or withdraw my consent to the use of telemedicine in the course of my care at any time, without affecting my right to future care or treatment, and that the Practitioner or I may terminate the telemedicine visit at any time. I understand that I have the right to inspect all information obtained and/or recorded in the course of the telemedicine visit and may receive copies of available information for a reasonable fee.  I understand that some of the potential risks of receiving the Services via telemedicine include:  Delay or interruption in medical evaluation due to technological equipment failure or disruption; Information transmitted may not be sufficient (e.g. poor resolution of images) to allow for appropriate medical decision making by the  Practitioner; and/or  In rare instances, security protocols could fail, causing a breach of personal health information.  Furthermore, I acknowledge that it is my responsibility to provide information about my medical history, conditions and care that is complete and accurate to the best of my ability. I acknowledge that Practitioner's advice, recommendations, and/or decision may be based on factors not within their control, such as incomplete or inaccurate data provided by me or distortions of diagnostic images or specimens that may result from electronic transmissions. I understand that the practice of medicine is not an exact science and that Practitioner makes no warranties or guarantees regarding treatment outcomes. I acknowledge that a copy of this consent can be made available to me via my patient portal [facility] MyChart), or I can request a printed copy by calling the office of [facility].    I understand that my insurance will be billed for this visit.   I have read or had this consent read to me. I understand the contents of this consent, which adequately explains the benefits and risks of the Services being provided via telemedicine.  I have been provided ample opportunity to ask questions regarding this consent and the Services and have had my questions answered to my satisfaction. I give my informed consent for the services to be provided through the use of telemedicine in my medical care

## 2023-01-09 NOTE — Telephone Encounter (Signed)
Patient with diagnosis of afib on Eliquis for anticoagulation.    Procedure: Surgical Staging, including total hysterectomy, bilateral salpingo-ophonectomy and lymph node assessment   Date of procedure: TBD  CHA2DS2-VASc Score = 7  This indicates a 11.2% annual risk of stroke. The patient's score is based upon: CHF History: 1 HTN History: 1 Diabetes History: 1 Stroke History: 0 Vascular Disease History: 1 Age Score: 2 Gender Score: 1   CrCl 42mL/min using adjusted body weight Platelet count 215K  Per office protocol, patient can hold Eliquis for 2-3 days prior to procedure.    **This guidance is not considered finalized until pre-operative APP has relayed final recommendations.**

## 2023-01-09 NOTE — Telephone Encounter (Signed)
Pt scheduled for tele visit on 01/16/23. Med rec and consent done

## 2023-01-12 ENCOUNTER — Other Ambulatory Visit: Payer: Self-pay

## 2023-01-12 ENCOUNTER — Inpatient Hospital Stay (HOSPITAL_COMMUNITY)
Admission: EM | Admit: 2023-01-12 | Discharge: 2023-01-18 | DRG: 291 | Disposition: A | Discharge status: Home Health | Payer: Medicare Other | Attending: Family Medicine | Admitting: Family Medicine

## 2023-01-12 ENCOUNTER — Emergency Department (HOSPITAL_COMMUNITY): Payer: Medicare Other

## 2023-01-12 ENCOUNTER — Ambulatory Visit (HOSPITAL_COMMUNITY)
Admission: RE | Admit: 2023-01-12 | Discharge: 2023-01-12 | Disposition: A | Discharge status: Home / Self Care | Payer: Medicare Other | Source: Ambulatory Visit | Attending: Psychiatry | Admitting: Psychiatry

## 2023-01-12 ENCOUNTER — Encounter (HOSPITAL_COMMUNITY): Payer: Self-pay

## 2023-01-12 DIAGNOSIS — G894 Chronic pain syndrome: Secondary | ICD-10-CM | POA: Diagnosis present

## 2023-01-12 DIAGNOSIS — Z6835 Body mass index (BMI) 35.0-35.9, adult: Secondary | ICD-10-CM

## 2023-01-12 DIAGNOSIS — N1831 Chronic kidney disease, stage 3a: Secondary | ICD-10-CM | POA: Diagnosis present

## 2023-01-12 DIAGNOSIS — Z96611 Presence of right artificial shoulder joint: Secondary | ICD-10-CM | POA: Diagnosis not present

## 2023-01-12 DIAGNOSIS — G250 Essential tremor: Secondary | ICD-10-CM | POA: Diagnosis not present

## 2023-01-12 DIAGNOSIS — M549 Dorsalgia, unspecified: Secondary | ICD-10-CM | POA: Diagnosis present

## 2023-01-12 DIAGNOSIS — E8809 Other disorders of plasma-protein metabolism, not elsewhere classified: Secondary | ICD-10-CM | POA: Diagnosis present

## 2023-01-12 DIAGNOSIS — Z794 Long term (current) use of insulin: Secondary | ICD-10-CM

## 2023-01-12 DIAGNOSIS — I482 Chronic atrial fibrillation, unspecified: Secondary | ICD-10-CM | POA: Diagnosis not present

## 2023-01-12 DIAGNOSIS — I2583 Coronary atherosclerosis due to lipid rich plaque: Secondary | ICD-10-CM | POA: Diagnosis not present

## 2023-01-12 DIAGNOSIS — E7800 Pure hypercholesterolemia, unspecified: Secondary | ICD-10-CM | POA: Diagnosis not present

## 2023-01-12 DIAGNOSIS — I272 Pulmonary hypertension, unspecified: Secondary | ICD-10-CM | POA: Diagnosis present

## 2023-01-12 DIAGNOSIS — Z833 Family history of diabetes mellitus: Secondary | ICD-10-CM

## 2023-01-12 DIAGNOSIS — N179 Acute kidney failure, unspecified: Secondary | ICD-10-CM | POA: Diagnosis not present

## 2023-01-12 DIAGNOSIS — I130 Hypertensive heart and chronic kidney disease with heart failure and stage 1 through stage 4 chronic kidney disease, or unspecified chronic kidney disease: Secondary | ICD-10-CM | POA: Diagnosis present

## 2023-01-12 DIAGNOSIS — R7989 Other specified abnormal findings of blood chemistry: Secondary | ICD-10-CM | POA: Diagnosis not present

## 2023-01-12 DIAGNOSIS — K760 Fatty (change of) liver, not elsewhere classified: Secondary | ICD-10-CM | POA: Diagnosis present

## 2023-01-12 DIAGNOSIS — Z7901 Long term (current) use of anticoagulants: Secondary | ICD-10-CM

## 2023-01-12 DIAGNOSIS — E1140 Type 2 diabetes mellitus with diabetic neuropathy, unspecified: Secondary | ICD-10-CM | POA: Diagnosis present

## 2023-01-12 DIAGNOSIS — Z79899 Other long term (current) drug therapy: Secondary | ICD-10-CM

## 2023-01-12 DIAGNOSIS — Z811 Family history of alcohol abuse and dependence: Secondary | ICD-10-CM

## 2023-01-12 DIAGNOSIS — J810 Acute pulmonary edema: Secondary | ICD-10-CM | POA: Diagnosis not present

## 2023-01-12 DIAGNOSIS — F319 Bipolar disorder, unspecified: Secondary | ICD-10-CM | POA: Diagnosis present

## 2023-01-12 DIAGNOSIS — I5031 Acute diastolic (congestive) heart failure: Secondary | ICD-10-CM | POA: Diagnosis not present

## 2023-01-12 DIAGNOSIS — I700 Atherosclerosis of aorta: Secondary | ICD-10-CM | POA: Diagnosis not present

## 2023-01-12 DIAGNOSIS — I10 Essential (primary) hypertension: Secondary | ICD-10-CM | POA: Diagnosis not present

## 2023-01-12 DIAGNOSIS — G4700 Insomnia, unspecified: Secondary | ICD-10-CM | POA: Diagnosis present

## 2023-01-12 DIAGNOSIS — I160 Hypertensive urgency: Secondary | ICD-10-CM | POA: Diagnosis not present

## 2023-01-12 DIAGNOSIS — I4821 Permanent atrial fibrillation: Secondary | ICD-10-CM | POA: Diagnosis not present

## 2023-01-12 DIAGNOSIS — Z7985 Long-term (current) use of injectable non-insulin antidiabetic drugs: Secondary | ICD-10-CM

## 2023-01-12 DIAGNOSIS — R918 Other nonspecific abnormal finding of lung field: Secondary | ICD-10-CM | POA: Diagnosis not present

## 2023-01-12 DIAGNOSIS — E872 Acidosis, unspecified: Secondary | ICD-10-CM

## 2023-01-12 DIAGNOSIS — I959 Hypotension, unspecified: Secondary | ICD-10-CM | POA: Diagnosis not present

## 2023-01-12 DIAGNOSIS — I952 Hypotension due to drugs: Secondary | ICD-10-CM | POA: Diagnosis present

## 2023-01-12 DIAGNOSIS — C541 Malignant neoplasm of endometrium: Secondary | ICD-10-CM

## 2023-01-12 DIAGNOSIS — E119 Type 2 diabetes mellitus without complications: Secondary | ICD-10-CM | POA: Diagnosis not present

## 2023-01-12 DIAGNOSIS — E871 Hypo-osmolality and hyponatremia: Secondary | ICD-10-CM | POA: Diagnosis present

## 2023-01-12 DIAGNOSIS — D72829 Elevated white blood cell count, unspecified: Secondary | ICD-10-CM | POA: Diagnosis present

## 2023-01-12 DIAGNOSIS — E1122 Type 2 diabetes mellitus with diabetic chronic kidney disease: Secondary | ICD-10-CM | POA: Diagnosis present

## 2023-01-12 DIAGNOSIS — J9601 Acute respiratory failure with hypoxia: Secondary | ICD-10-CM | POA: Diagnosis not present

## 2023-01-12 DIAGNOSIS — I2510 Atherosclerotic heart disease of native coronary artery without angina pectoris: Secondary | ICD-10-CM | POA: Diagnosis not present

## 2023-01-12 DIAGNOSIS — J45909 Unspecified asthma, uncomplicated: Secondary | ICD-10-CM | POA: Diagnosis present

## 2023-01-12 DIAGNOSIS — G4733 Obstructive sleep apnea (adult) (pediatric): Secondary | ICD-10-CM | POA: Diagnosis present

## 2023-01-12 DIAGNOSIS — F4312 Post-traumatic stress disorder, chronic: Secondary | ICD-10-CM | POA: Diagnosis present

## 2023-01-12 DIAGNOSIS — R079 Chest pain, unspecified: Secondary | ICD-10-CM | POA: Diagnosis not present

## 2023-01-12 DIAGNOSIS — Z91041 Radiographic dye allergy status: Secondary | ICD-10-CM

## 2023-01-12 DIAGNOSIS — I2489 Other forms of acute ischemic heart disease: Secondary | ICD-10-CM | POA: Diagnosis present

## 2023-01-12 DIAGNOSIS — T461X5A Adverse effect of calcium-channel blockers, initial encounter: Secondary | ICD-10-CM | POA: Diagnosis present

## 2023-01-12 DIAGNOSIS — Z9981 Dependence on supplemental oxygen: Secondary | ICD-10-CM

## 2023-01-12 DIAGNOSIS — G2581 Restless legs syndrome: Secondary | ICD-10-CM | POA: Diagnosis present

## 2023-01-12 DIAGNOSIS — Z888 Allergy status to other drugs, medicaments and biological substances status: Secondary | ICD-10-CM

## 2023-01-12 DIAGNOSIS — I4891 Unspecified atrial fibrillation: Secondary | ICD-10-CM | POA: Diagnosis present

## 2023-01-12 DIAGNOSIS — Z7951 Long term (current) use of inhaled steroids: Secondary | ICD-10-CM

## 2023-01-12 DIAGNOSIS — K5710 Diverticulosis of small intestine without perforation or abscess without bleeding: Secondary | ICD-10-CM | POA: Diagnosis not present

## 2023-01-12 DIAGNOSIS — I5033 Acute on chronic diastolic (congestive) heart failure: Secondary | ICD-10-CM | POA: Diagnosis not present

## 2023-01-12 DIAGNOSIS — I517 Cardiomegaly: Secondary | ICD-10-CM | POA: Diagnosis not present

## 2023-01-12 DIAGNOSIS — J9621 Acute and chronic respiratory failure with hypoxia: Secondary | ICD-10-CM | POA: Diagnosis not present

## 2023-01-12 DIAGNOSIS — Z8249 Family history of ischemic heart disease and other diseases of the circulatory system: Secondary | ICD-10-CM

## 2023-01-12 DIAGNOSIS — Z823 Family history of stroke: Secondary | ICD-10-CM

## 2023-01-12 DIAGNOSIS — E0810 Diabetes mellitus due to underlying condition with ketoacidosis without coma: Secondary | ICD-10-CM | POA: Diagnosis not present

## 2023-01-12 DIAGNOSIS — E669 Obesity, unspecified: Secondary | ICD-10-CM | POA: Diagnosis not present

## 2023-01-12 DIAGNOSIS — Z818 Family history of other mental and behavioral disorders: Secondary | ICD-10-CM

## 2023-01-12 DIAGNOSIS — Z7984 Long term (current) use of oral hypoglycemic drugs: Secondary | ICD-10-CM

## 2023-01-12 DIAGNOSIS — Z91040 Latex allergy status: Secondary | ICD-10-CM

## 2023-01-12 DIAGNOSIS — Z7983 Long term (current) use of bisphosphonates: Secondary | ICD-10-CM

## 2023-01-12 DIAGNOSIS — Z820 Family history of epilepsy and other diseases of the nervous system: Secondary | ICD-10-CM

## 2023-01-12 DIAGNOSIS — F419 Anxiety disorder, unspecified: Secondary | ICD-10-CM | POA: Diagnosis present

## 2023-01-12 HISTORY — DX: Acidosis, unspecified: E87.20

## 2023-01-12 LAB — BASIC METABOLIC PANEL
Anion gap: 19 — ABNORMAL HIGH (ref 5–15)
BUN: 27 mg/dL — ABNORMAL HIGH (ref 8–23)
CO2: 18 mmol/L — ABNORMAL LOW (ref 22–32)
Calcium: 8.9 mg/dL (ref 8.9–10.3)
Chloride: 94 mmol/L — ABNORMAL LOW (ref 98–111)
Creatinine, Ser: 1.21 mg/dL — ABNORMAL HIGH (ref 0.44–1.00)
GFR, Estimated: 47 mL/min — ABNORMAL LOW (ref >60)
Glucose, Bld: 487 mg/dL — ABNORMAL HIGH (ref 70–99)
Potassium: 4.2 mmol/L (ref 3.5–5.1)
Sodium: 131 mmol/L — ABNORMAL LOW (ref 135–145)

## 2023-01-12 LAB — BLOOD GAS, VENOUS
Acid-base deficit: 9.2 mmol/L — ABNORMAL HIGH (ref 0.0–2.0)
Acid-base deficit: 4.8 mmol/L — ABNORMAL HIGH (ref 0.0–2.0)
Bicarbonate: 20.6 mmol/L (ref 20.0–28.0)
Bicarbonate: 22.1 mmol/L (ref 20.0–28.0)
O2 Saturation: 49.9 %
O2 Saturation: 61.8 %
Patient temperature: 37
Patient temperature: 37
pCO2, Ven: 59 mmHg (ref 44–60)
pCO2, Ven: 47 mmHg (ref 44–60)
pH, Ven: 7.15 — CL (ref 7.25–7.43)
pH, Ven: 7.28 (ref 7.25–7.43)
pO2, Ven: 35 mmHg (ref 32–45)
pO2, Ven: 38 mmHg (ref 32–45)

## 2023-01-12 LAB — TROPONIN I (HIGH SENSITIVITY)
Troponin I (High Sensitivity): 56 ng/L — ABNORMAL HIGH (ref <18)
Troponin I (High Sensitivity): 377 ng/L (ref <18)
Troponin I (High Sensitivity): 852 ng/L (ref <18)

## 2023-01-12 LAB — CBC
HCT: 49.2 % — ABNORMAL HIGH (ref 36.0–46.0)
Hemoglobin: 15.3 g/dL — ABNORMAL HIGH (ref 12.0–15.0)
MCH: 28.4 pg (ref 26.0–34.0)
MCHC: 31.1 g/dL (ref 30.0–36.0)
MCV: 91.4 fL (ref 80.0–100.0)
Platelets: 277 10*3/uL (ref 150–400)
RBC: 5.38 MIL/uL — ABNORMAL HIGH (ref 3.87–5.11)
RDW: 15.2 % (ref 11.5–15.5)
WBC: 16.8 10*3/uL — ABNORMAL HIGH (ref 4.0–10.5)
nRBC: 0 % (ref 0.0–0.2)

## 2023-01-12 LAB — HEPARIN LEVEL (UNFRACTIONATED): Heparin Unfractionated: >1.1 IU/mL — ABNORMAL HIGH (ref 0.30–0.70)

## 2023-01-12 LAB — GLUCOSE, CAPILLARY
Glucose-Capillary: 383 mg/dL — ABNORMAL HIGH (ref 70–99)
Glucose-Capillary: 479 mg/dL — ABNORMAL HIGH (ref 70–99)
Glucose-Capillary: 405 mg/dL — ABNORMAL HIGH (ref 70–99)

## 2023-01-12 LAB — MRSA NEXT GEN BY PCR, NASAL: MRSA by PCR Next Gen: NOT DETECTED

## 2023-01-12 LAB — BRAIN NATRIURETIC PEPTIDE: B Natriuretic Peptide: 446.1 pg/mL — ABNORMAL HIGH (ref 0.0–100.0)

## 2023-01-12 LAB — APTT: aPTT: 24 seconds (ref 24–36)

## 2023-01-12 LAB — CBG MONITORING, ED: Glucose-Capillary: 496 mg/dL — ABNORMAL HIGH (ref 70–99)

## 2023-01-12 LAB — MAGNESIUM: Magnesium: 2.2 mg/dL (ref 1.7–2.4)

## 2023-01-12 MED ORDER — VITAMIN B-12 1000 MCG PO TABS
1000.0000 ug | ORAL_TABLET | Freq: Every day | ORAL | Status: DC
  Administered 2023-01-13 – 2023-01-18 (×6): 1000 ug via ORAL
  Filled 2023-01-12 (×6): qty 1

## 2023-01-12 MED ORDER — HEPARIN (PORCINE) 25000 UT/250ML-% IV SOLN
1200.0000 [IU]/h | INTRAVENOUS | Status: DC
  Administered 2023-01-12: 1000 [IU]/h via INTRAVENOUS
  Filled 2023-01-12: qty 250

## 2023-01-12 MED ORDER — DILTIAZEM LOAD VIA INFUSION
20.0000 mg | Freq: Once | INTRAVENOUS | Status: AC
  Administered 2023-01-12: 20 mg via INTRAVENOUS
  Filled 2023-01-12: qty 20

## 2023-01-12 MED ORDER — PREGABALIN 100 MG PO CAPS
200.0000 mg | ORAL_CAPSULE | Freq: Every day | ORAL | Status: DC
  Administered 2023-01-12 – 2023-01-17 (×6): 200 mg via ORAL
  Filled 2023-01-12 (×6): qty 2

## 2023-01-12 MED ORDER — SODIUM CHLORIDE 0.9 % IV BOLUS
500.0000 mL | Freq: Once | INTRAVENOUS | Status: AC
  Administered 2023-01-12: 500 mL via INTRAVENOUS

## 2023-01-12 MED ORDER — DOXEPIN HCL 10 MG PO CAPS
10.0000 mg | ORAL_CAPSULE | Freq: Every day | ORAL | Status: DC
  Administered 2023-01-12 – 2023-01-17 (×6): 10 mg via ORAL
  Filled 2023-01-12 (×6): qty 1

## 2023-01-12 MED ORDER — ESCITALOPRAM OXALATE 10 MG PO TABS
10.0000 mg | ORAL_TABLET | Freq: Every day | ORAL | Status: DC
  Administered 2023-01-13 – 2023-01-18 (×6): 10 mg via ORAL
  Filled 2023-01-12 (×6): qty 1

## 2023-01-12 MED ORDER — FUROSEMIDE 10 MG/ML IJ SOLN
40.0000 mg | Freq: Once | INTRAMUSCULAR | Status: AC
  Administered 2023-01-12: 40 mg via INTRAVENOUS
  Filled 2023-01-12: qty 4

## 2023-01-12 MED ORDER — OXCARBAZEPINE 150 MG PO TABS
150.0000 mg | ORAL_TABLET | Freq: Two times a day (BID) | ORAL | Status: DC
  Administered 2023-01-12 – 2023-01-18 (×12): 150 mg via ORAL
  Filled 2023-01-12 (×12): qty 1

## 2023-01-12 MED ORDER — MORPHINE SULFATE ER 15 MG PO TBCR
15.0000 mg | EXTENDED_RELEASE_TABLET | Freq: Two times a day (BID) | ORAL | Status: DC
  Administered 2023-01-12 – 2023-01-18 (×12): 15 mg via ORAL
  Filled 2023-01-12 (×12): qty 1

## 2023-01-12 MED ORDER — VITAMIN D 25 MCG (1000 UNIT) PO TABS
1000.0000 [IU] | ORAL_TABLET | Freq: Every day | ORAL | Status: DC
  Administered 2023-01-13 – 2023-01-17 (×5): 1000 [IU] via ORAL
  Filled 2023-01-12 (×5): qty 1

## 2023-01-12 MED ORDER — DILTIAZEM HCL-DEXTROSE 125-5 MG/125ML-% IV SOLN (PREMIX)
5.0000 mg/h | INTRAVENOUS | Status: DC
  Administered 2023-01-12: 5 mg/h via INTRAVENOUS
  Filled 2023-01-12: qty 125

## 2023-01-12 MED ORDER — ORAL CARE MOUTH RINSE
15.0000 mL | OROMUCOSAL | Status: DC
  Administered 2023-01-13 – 2023-01-14 (×3): 15 mL via OROMUCOSAL

## 2023-01-12 MED ORDER — FUROSEMIDE 10 MG/ML IJ SOLN
40.0000 mg | Freq: Every day | INTRAMUSCULAR | Status: DC
  Administered 2023-01-13 – 2023-01-14 (×2): 40 mg via INTRAVENOUS
  Filled 2023-01-12 (×2): qty 4

## 2023-01-12 MED ORDER — ALLOPURINOL 100 MG PO TABS
100.0000 mg | ORAL_TABLET | Freq: Every day | ORAL | Status: DC
  Administered 2023-01-13 – 2023-01-18 (×6): 100 mg via ORAL
  Filled 2023-01-12 (×6): qty 1

## 2023-01-12 MED ORDER — CHLORHEXIDINE GLUCONATE CLOTH 2 % EX PADS
6.0000 | MEDICATED_PAD | Freq: Every day | CUTANEOUS | Status: DC
  Administered 2023-01-12 – 2023-01-17 (×5): 6 via TOPICAL

## 2023-01-12 MED ORDER — INSULIN ASPART 100 UNIT/ML IJ SOLN
0.0000 [IU] | Freq: Three times a day (TID) | INTRAMUSCULAR | Status: DC
  Administered 2023-01-12: 20 [IU] via SUBCUTANEOUS
  Administered 2023-01-13: 7 [IU] via SUBCUTANEOUS
  Administered 2023-01-13: 15 [IU] via SUBCUTANEOUS
  Administered 2023-01-13 – 2023-01-14 (×2): 4 [IU] via SUBCUTANEOUS
  Administered 2023-01-14: 3 [IU] via SUBCUTANEOUS
  Administered 2023-01-14: 11 [IU] via SUBCUTANEOUS
  Filled 2023-01-12: qty 0.2

## 2023-01-12 MED ORDER — ZOLPIDEM TARTRATE 5 MG PO TABS
5.0000 mg | ORAL_TABLET | Freq: Every evening | ORAL | Status: DC | PRN
  Administered 2023-01-16 – 2023-01-17 (×3): 5 mg via ORAL
  Filled 2023-01-12 (×4): qty 1

## 2023-01-12 MED ORDER — ORAL CARE MOUTH RINSE: 15.0000 mL | OROMUCOSAL | Status: DC | PRN

## 2023-01-12 MED ORDER — SUVOREXANT 20 MG PO TABS: 1.0000 | ORAL_TABLET | Freq: Every evening | ORAL | Status: DC | PRN

## 2023-01-12 MED ORDER — ARIPIPRAZOLE 2 MG PO TABS
2.0000 mg | ORAL_TABLET | Freq: Every day | ORAL | Status: DC
  Administered 2023-01-12 – 2023-01-17 (×6): 2 mg via ORAL
  Filled 2023-01-12 (×6): qty 1

## 2023-01-12 MED ORDER — METOPROLOL TARTRATE 50 MG PO TABS
50.0000 mg | ORAL_TABLET | Freq: Two times a day (BID) | ORAL | Status: DC
  Administered 2023-01-13 – 2023-01-18 (×11): 50 mg via ORAL
  Filled 2023-01-12: qty 2
  Filled 2023-01-12: qty 1
  Filled 2023-01-12 (×2): qty 2
  Filled 2023-01-12 (×2): qty 1
  Filled 2023-01-12 (×5): qty 2

## 2023-01-12 MED ORDER — LORAZEPAM 2 MG/ML IJ SOLN
1.0000 mg | Freq: Once | INTRAMUSCULAR | Status: AC
  Administered 2023-01-12: 1 mg via INTRAVENOUS
  Filled 2023-01-12: qty 1

## 2023-01-12 MED ORDER — NORETHINDRONE ACETATE 5 MG PO TABS
10.0000 mg | ORAL_TABLET | Freq: Every day | ORAL | Status: DC
  Administered 2023-01-13 – 2023-01-18 (×7): 10 mg via ORAL
  Filled 2023-01-12 (×7): qty 2

## 2023-01-12 MED ORDER — ACETAMINOPHEN 325 MG PO TABS
325.0000 mg | ORAL_TABLET | ORAL | Status: DC | PRN
  Administered 2023-01-14 – 2023-01-16 (×2): 325 mg via ORAL
  Filled 2023-01-12 (×2): qty 1

## 2023-01-12 NOTE — Progress Notes (Signed)
   01/12/23 2008  BiPAP/CPAP/SIPAP  BiPAP/CPAP/SIPAP Pt Type Adult  BiPAP/CPAP/SIPAP V60  Mask Type Full face mask  Mask Size Medium  Set Rate 16 breaths/min  Respiratory Rate 20 breaths/min  IPAP 16 cmH20  EPAP 6 cmH2O  FiO2 (%) 45 %  Minute Ventilation 8.5  Leak 0  Peak Inspiratory Pressure (PIP) 17  Tidal Volume (Vt) 420  Patient Home Equipment No  Auto Titrate No  Press High Alarm 35 cmH2O  Press Low Alarm 5 cmH2O

## 2023-01-12 NOTE — ED Notes (Signed)
..ED TO INPATIENT HANDOFF REPORT  Name/Age/Gender Amber Stephenson 77 y.o. female  Code Status Code Status History     Date Active Date Inactive Code Status Order ID Comments User Context   11/09/2022 1254 11/11/2022 2155 Full Code [id number]  Clydie Braun, MD ED   09/01/2022 1845 09/04/2022 1715 Full Code [id number]  Lanae Boast, MD Inpatient   06/27/2022 1744 06/29/2022 2006 Full Code [id number]  Levie Heritage, DO ED   06/08/2022 1632 06/13/2022 2018 Full Code [id number]  Ollen Bowl, MD Inpatient   12/13/2020 2146 12/16/2020 1927 Full Code [id number]  Frankey Shown, DO ED   04/23/2020 1604 04/24/2020 1916 Full Code [id number]  Shon Hale, MD ED   08/22/2019 2321 08/23/2019 1925 Full Code [id number]  Bobette Mo, MD ED   08/19/2019 1109 08/20/2019 1740 Full Code [id number]  Beverely Low, MD Inpatient   02/01/2019 1935 02/04/2019 1808 Full Code [id number]  Onnie Boer, MD Inpatient   01/21/2019 1716 01/23/2019 1524 Full Code [id number]  Darlin Drop, DO Inpatient   03/06/2018 0231 03/08/2018 1646 Full Code [id number]  Hillary Bow, DO ED   02/03/2018 1416 02/03/2018 1935 Full Code [id number]  [name], Peter M, MD Inpatient   05/19/2017 1955 05/20/2017 1956 Full Code [id number]  Briscoe Deutscher, MD ED   10/01/2015 1255 10/03/2015 1330 Full Code [id number]  Cathren Laine, MD ED   05/30/2015 0205 05/30/2015 2010 Full Code [id number]  Carron Curie, MD ED    Questions for Most Recent Historical Code Status (Order [id number])     Question Answer   By: Consent: discussion documented in EHR            Home/SNF/Other Home  Chief Complaint Acute on chronic respiratory failure with hypoxia (HCC) [J96.21]  Level of Care/Admitting Diagnosis ED Disposition     ED Disposition  Admit   Condition  --   Comment  Hospital Area: [facility] [[id number]]  Level of Care: Stepdown [14]  Admit to SDU based on following criteria: Cardiac Instability:  Patients experiencing  chest pain, unconfirmed MI and stable, arrhythmias and CHF requiring medical management and potentially compromising patient's stability  May admit patient to [facility] or [facility] if equivalent level of care is available:: No  Covid Evaluation: Asymptomatic - no recent exposure (last 10 days) testing not required  Diagnosis: Acute on chronic respiratory failure with hypoxia [facility]) [[id number]]  Admitting Physician: Anselm Jungling [[id number]]  Attending Physician: Anselm Jungling [[id number]]  Certification:: I certify this patient will need inpatient services for at least 2 midnights  Estimated Length of Stay: 2          Medical History Past Medical History:  Diagnosis Date   Acquired hammer toe 12/06/2020   Acute metabolic encephalopathy 06/27/2022   Allergic rhinitis 02/24/2019   Ambulatory dysfunction 04/23/2020   Atrial fibrillation with RVR (HCC) 05/19/2017   Back pain/sacroiliitis--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   Benign paroxysmal positional vertigo due to bilateral vestibular disorder 05/20/2019   Bipolar 1 disorder (HCC) 01/23/2015   with GAD, benzo dependence   BPPV (benign paroxysmal positional vertigo) 05/20/2019   CAD (coronary artery disease)    Nonobstructive; Managed by Dr. Purvis Sheffield   Cardiomegaly 01/12/2018   CHF (congestive heart failure) 06/08/2022   Chronic back pain 01/04/2015   Chronic constipation 04/25/2020   Chronic diastolic heart failure (HCC) 05/30/2015  Chronic pain syndrome 08/22/2019   back pain, sacroiliitis   Chronic pain syndrome 08/22/2019   Chronic post-traumatic stress disorder (PTSD) 12/06/2020   Diabetic neuropathy (HCC) 02/06/2016   Dyslipidemia 09/24/2020   Dyspnea on exertion    Essential hypertension    Family history of coronary arteriosclerosis 05/30/2015   Functional diarrhea 10/26/2020   Head trauma 09/17/2020   Hemorrhoids  03/11/2022   Herpes genitalis in women 07/16/2015   History of adenomatous polyp of colon 05/21/2019   Overview:   03/31/17: Colonoscopy: nonadvanced adenoma, microscopic colitis, f/u 5 yrs, [facility]/GAP   Insomnia 01/23/2015   Lipoma 02/08/2015   Migraine headache with aura 02/12/2016   Myofascial pain dysfunction syndrome 08/22/2019   Myofascial pain dysfunction syndrome 08/22/2019   Non-alcoholic fatty liver disease 01/12/2018   Opioid dependence (HCC) 12/06/2020   OSA (obstructive sleep apnea) 02/24/2019   10/09/2018 - HST  - AHI 40.6    Osteopenia 12/06/2020   Rx alendronate 35 mg.   Pinguecula 12/06/2020   Postcoital bleeding 12/06/2020   Presbyopia 12/06/2020   Pulmonary hypertension    RLS (restless legs syndrome) 04/27/2015   Superficial fungus infection of skin 09/03/2021   Tremor, essential 12/11/2021   Type II diabetes mellitus (HCC) 09/24/2020    Allergies Allergies  Allergen Reactions   Iodine Anaphylaxis   Ivp Dye [Iodinated Contrast Media] Anaphylaxis and Swelling    Throat closes    Latex Other (See Comments)    Latex tape pulls skin with it   Tizanidine Other (See Comments)    Weakness, goofy, bad dreams    IV Location/Drains/Wounds Patient Lines/Drains/Airways Status     Active Line/Drains/Airways     Name Placement date Placement time Site Days   Peripheral IV 01/12/23 20 G 2.5" Anterior;Right;Upper Arm 01/12/23  1422  Arm  less than 1            Labs/Imaging Results for orders placed or performed during the hospital encounter of 01/12/23 (from the past 48 hour(s))  CBG monitoring, ED     Status: Abnormal   Collection Time: 01/12/23  1:54 PM  Result Value Ref Range   Glucose-Capillary 496 (H) 70 - 99 mg/dL    Comment: Glucose reference range applies only to samples taken after fasting for at least 8 hours.  Basic metabolic panel     Status: Abnormal   Collection Time: 01/12/23  2:15 PM  Result Value Ref Range   Sodium 131 (L) 135 - 145  mmol/L   Potassium 4.2 3.5 - 5.1 mmol/L   Chloride 94 (L) 98 - 111 mmol/L   CO2 18 (L) 22 - 32 mmol/L   Glucose, Bld 487 (H) 70 - 99 mg/dL    Comment: Glucose reference range applies only to samples taken after fasting for at least 8 hours.   BUN 27 (H) 8 - 23 mg/dL   Creatinine, Ser 1.02 (H) 0.44 - 1.00 mg/dL   Calcium 8.9 8.9 - 72.5 mg/dL   GFR, Estimated 47 (L) >60 mL/min    Comment: (NOTE) Calculated using the CKD-EPI Creatinine Equation (2021)    Anion gap 19 (H) 5 - 15    Comment: Performed at [facility], [address] [place], [place] [address]  Magnesium     Status: None   Collection Time: 01/12/23  2:15 PM  Result Value Ref Range   Magnesium 2.2 1.7 - 2.4 mg/dL    Comment: Performed at [facility], [address] [place], [place]  [address]  CBC     Status: Abnormal   Collection Time: 01/12/23  2:15 PM  Result Value Ref Range   WBC 16.8 (H) 4.0 - 10.5 K/uL   RBC 5.38 (H) 3.87 - 5.11 MIL/uL   Hemoglobin 15.3 (H) 12.0 - 15.0 g/dL   HCT 19.1 (H) 47.8 - 29.5 %   MCV 91.4 80.0 - 100.0 fL   MCH 28.4 26.0 - 34.0 pg   MCHC 31.1 30.0 - 36.0 g/dL   RDW 62.1 30.8 - 65.7 %   Platelets 277 150 - 400 K/uL   nRBC 0.0 0.0 - 0.2 %    Comment: Performed at [facility], [address], [place], [place] [address]  Troponin I (High Sensitivity)     Status: Abnormal   Collection Time: 01/12/23  2:15 PM  Result Value Ref Range   Troponin I (High Sensitivity) 56 (H) <18 ng/L    Comment: (NOTE) Elevated high sensitivity troponin I (hsTnI) values and significant  changes across serial measurements may suggest ACS but many other  chronic and acute conditions are known to elevate hsTnI results.  Refer to the "Links" section for chest pain algorithms and additional  guidance. Performed at [facility], [address] [place], [place] [address]   Brain natriuretic peptide     Status: Abnormal   Collection  Time: 01/12/23  2:15 PM  Result Value Ref Range   B Natriuretic Peptide 446.1 (H) 0.0 - 100.0 pg/mL    Comment: Performed at [facility], [address], [place], [place] [address]  Blood gas, venous (at [facility] and [facility])     Status: Abnormal   Collection Time: 01/12/23  2:15 PM  Result Value Ref Range   pH, Ven 7.15 (LL) 7.25 - 7.43    Comment: CRITICAL RESULT CALLED TO, READ BACK BY AND VERIFIED WITH: KENT,Q RN @ 1450 ON 01/12/2023 BY XIONG, K    pCO2, Ven 59 44 - 60 mmHg   pO2, Ven 35 32 - 45 mmHg   Bicarbonate 20.6 20.0 - 28.0 mmol/L   Acid-base deficit 9.2 (H) 0.0 - 2.0 mmol/L   O2 Saturation 49.9 %   Patient temperature 37.0     Comment: Performed at [facility], [address] [place], [place] [address]  Blood gas, venous     Status: Abnormal   Collection Time: 01/12/23  4:00 PM  Result Value Ref Range   pH, Ven 7.28 7.25 - 7.43   pCO2, Ven 47 44 - 60 mmHg   pO2, Ven 38 32 - 45 mmHg   Bicarbonate 22.1 20.0 - 28.0 mmol/L   Acid-base deficit 4.8 (H) 0.0 - 2.0 mmol/L   O2 Saturation 61.8 %   Patient temperature 37.0     Comment: Performed at [facility], [address], [place], [place] [address]  Troponin I (High Sensitivity)     Status: Abnormal   Collection Time: 01/12/23  4:04 PM  Result Value Ref Range   Troponin I (High Sensitivity) 377 (HH) <18 ng/L    Comment: DELTA CHECK NOTED CRITICAL RESULT CALLED TO, READ BACK BY AND VERIFIED WITH P. DOWD, RN AT 1730 ON 08.12.24 BY N.THOMPSON (NOTE) Elevated high sensitivity troponin I (hsTnI) values and significant  changes across serial measurements may suggest ACS but many other  chronic and acute conditions are known to elevate hsTnI results.  Refer to the "Links" section for chest pain algorithms and additional  guidance.  Performed at [facility], [address], [place], [place] [address]    *Note: Due to a large number of results and/or  encounters for the requested time period, some results have not been displayed. A complete set of results can be found in Results Review.   DG Chest Port 1 View  Result Date: 01/12/2023 CLINICAL DATA:  shob EXAM: PORTABLE CHEST 1 VIEW COMPARISON:  CXR 11/26/22 FINDINGS: Pleural effusion. No pneumothorax. Unchanged cardiac and mediastinal contours. Compared to prior exam there are new hazy opacities in the bilateral mid lung fields, which could represent pulmonary edema or multifocal infection. No radiographically apparent displaced rib fractures. Visualized upper abdomen unremarkable. Right shoulder arthroplasty. IMPRESSION: Compared to prior exam there are new hazy opacities in the bilateral mid lung fields, which could represent pulmonary edema or multifocal infection. Electronically Signed   By: Lorenza Cambridge M.D.   On: 01/12/2023 14:36    Pending Labs Unresulted Labs (From admission, onward)     Start     Ordered   01/13/23 0500  CBC  Daily,   R      01/12/23 1922   01/12/23 1832  APTT  ONCE - STAT,   STAT        01/12/23 1831   01/12/23 1832  Heparin level (unfractionated)  ONCE - URGENT,   URGENT        01/12/23 1831            Vitals/Pain Today's Vitals   01/12/23 1730 01/12/23 1804 01/12/23 1810 01/12/23 1837  BP: (!) 132/105  (!) 92/55 110/68  Pulse: 96  71 86  Resp: 17  (!) 22 (!) 25  Temp:  98.3 F (36.8 C)    TempSrc:  Axillary    SpO2: 97%  96% 100%  PainSc:        Isolation Precautions No active isolations  Medications Medications  diltiazem (CARDIZEM) 1 mg/mL load via infusion 20 mg (20 mg Intravenous Bolus from Bag 01/12/23 1434)    And  diltiazem (CARDIZEM) 125 mg in dextrose 5% 125 mL (1 mg/mL) infusion (0 mg/hr Intravenous Stopped 01/12/23 1839)  heparin ADULT infusion 100 units/mL (25000 units/262mL) (1,000 Units/hr Intravenous New Bag/Given 01/12/23 2001)  LORazepam (ATIVAN) injection 1 mg (1 mg Intravenous Given 01/12/23 1439)  furosemide (LASIX)  injection 40 mg (40 mg Intravenous Given 01/12/23 1459)  sodium chloride 0.9 % bolus 500 mL (0 mLs Intravenous Stopped 01/12/23 1931)    Mobility walks with person assist

## 2023-01-12 NOTE — Assessment & Plan Note (Signed)
-  last echo in June with EF of 65% and indeterminate diastolic dysfunction -reports dyspnea at rest and with exertion for several months prior to this incident  -she is on 20mg  Lasix at home. May need to increase dosage at home -continue IV Lasix 40mg  daily -follow intake and output, daily weights

## 2023-01-12 NOTE — ED Triage Notes (Signed)
From CT. Seen from Rapid response brought in for respiratory distress and tachycardia on Bipap currently.

## 2023-01-12 NOTE — Significant Event (Signed)
Rapid Response Event Note   Reason for Call :  Respiratory Distress   Initial Focused Assessment:  Here for Outpatient CT scan. Patient sitting up on CT table, diaphoretic, accessory muscle use, respirations in the 50's. Patient appeared to be in respiratory failure. Patients lung sounds were crackles and wheezes bilaterally. On monitor patient appeared to be in Afib RVR. Patient SpO2 72% while on 4L This RN placed patient on 15L NRB and HFNC SpO2 85%.     CT tech states patient had completed a 12hr prep leading up to CT which involved solu-medrol. Patient never received contrast nor completed CT scan.     ED charge notified, MD [facility] notified, and RT notified all of which came to bedside.   Interventions:  Patient placed on Bi-pap and transported safely to [facility] in Emergency department   Plan of Care:  Patient to be stabilized in Emergency Department    Event Summary:  Special thank you to the Emergency Department for some Grade A team work.  MD Notified: Derwood Kaplan MD Call Time: 1324 Arrival Time: 1330 End Time: 1400   Teresita Madura, RN

## 2023-01-12 NOTE — Assessment & Plan Note (Addendum)
-  creatinine of 1.21  -pt given IV 40mg  Lasix and subsequently 500cc bolus after hypotension with diltiazem infusion  -will hold home lisinopril and hydrochlorothiazide  -will be receiving daily IV diuresis  -avoid nephrotoxic agent when possible

## 2023-01-12 NOTE — H&P (Signed)
History and Physical    Patient: Amber Stephenson QIO:[id number] DOB: [birth year] DOA: 01/12/2023 DOS: the patient was seen and examined on 01/12/2023 PCP: Mechele Claude, MD  Patient coming from: Home  Chief Complaint:  Chief Complaint  Patient presents with   Respiratory Distress   Tachycardia   HPI: Amber Stephenson is a 77 y.o. female with medical history significant of Chronic diastolic heart failure, chronic atrial fibrillation on Eliquis, Chronic hypoxic respiratory failure on nocturnal 2L, HTN, insulin -dependent T2DM, HTN, HLD, bipolar 1 disorder, recent diagnosis of Grade 1 endometrioid cancer who presents from with acute hypoxic respiratory failure.    Pt recently diagnosed with endometrioid cancer and was getting CT imaging done today to further assess. However upon laying flat she developed acute respiratory distress. She has hx of anaphylaxis to IV contrast but this was not yet given. She became diaphoretic, tachycardic and tachynpeic and was placed initially on HFNC 15L with SpO2 of 85%. ED physician was called to evaluate and she was transitioned onto Bipap. Found to have rales on exam concerning for flash pulmonary edema. Also found to be in A.fib with RVR with HR 150s.  CXR was obtained which confirmed suspicion of pulmonary edema. ABG was acidotic with ph of 7.15 with CO2 of 59 and bicarb of 20.   After multiple re-evaluation, decision was made to start diltiazem infusion and her rates improved. She was also given IV 40mg  Lasix. Blood pressure however later trended down to SBP of 80s and diltiazem infusion was discontinued. Small 500cc bolus was given.   Her first troponin was elevated to 56 and though to be due to demand ischemic. However second troponin had significant delta change to 377. Cardiology Dr. Duke Salvia consulted and recommended starting IV heparin and continue to trend troponin although still likely to be due to demand ischemia. Pt was okay to stay here at [facility]  long rather than [facility] as she is unlikely to require any intervention.   Pt reports that for the past several months she has noticed increasing shortness of breath both at rest and with exertion. No lower extremity edema. No chest pain. She is complaint on her 20mg  Lasix. She normally has to sleep reclined at home and this is first time she had to lay completely flat on scanner table.    Review of Systems: As mentioned in the history of present illness. All other systems reviewed and are negative. 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   Benign paroxysmal positional vertigo due to bilateral vestibular disorder 05/20/2019   Bipolar 1 disorder (HCC) 01/23/2015   with GAD, benzo dependence   BPPV (benign paroxysmal positional vertigo) 05/20/2019   CAD (coronary artery disease)    Nonobstructive; Managed by Dr. Purvis Sheffield   Cardiomegaly 01/12/2018   CHF (congestive heart failure) 06/08/2022   Chronic back pain 01/04/2015   Chronic constipation 04/25/2020   Chronic diastolic heart failure (HCC) 05/30/2015   Chronic pain syndrome 08/22/2019   back pain, sacroiliitis   Chronic pain syndrome 08/22/2019   Chronic post-traumatic stress disorder (PTSD) 12/06/2020   Diabetic neuropathy (HCC) 02/06/2016   Dyslipidemia 09/24/2020   Dyspnea on exertion  Essential hypertension    Family history of coronary arteriosclerosis 05/30/2015   Functional diarrhea 10/26/2020   Head trauma 09/17/2020   Hemorrhoids 03/11/2022   Herpes genitalis in women 07/16/2015   History of adenomatous polyp of colon 05/21/2019   Overview:   03/31/17: Colonoscopy:  nonadvanced adenoma, microscopic colitis, f/u 5 yrs, [facility]/GAP   Insomnia 01/23/2015   Lipoma 02/08/2015   Migraine headache with aura 02/12/2016   Myofascial pain dysfunction syndrome 08/22/2019   Myofascial pain dysfunction syndrome 08/22/2019   Non-alcoholic fatty liver disease 01/12/2018   Opioid dependence (HCC) 12/06/2020   OSA (obstructive sleep apnea) 02/24/2019   10/09/2018 - HST  - AHI 40.6    Osteopenia 12/06/2020   Rx alendronate 35 mg.   Pinguecula 12/06/2020   Postcoital bleeding 12/06/2020   Presbyopia 12/06/2020   Pulmonary hypertension    RLS (restless legs syndrome) 04/27/2015   Superficial fungus infection of skin 09/03/2021   Tremor, essential 12/11/2021   Type II diabetes mellitus (HCC) 09/24/2020   Past Surgical History:  Procedure Laterality Date   BREAST REDUCTION SURGERY     EYE SURGERY Right    cateracts   HAMMER TOE SURGERY     LEFT HEART CATH AND CORONARY ANGIOGRAPHY N/A 02/03/2018   Procedure: LEFT HEART CATH AND CORONARY ANGIOGRAPHY;  Surgeon: [name], Peter M, MD;  Location: [facility] INVASIVE CV LAB;  Service: Cardiovascular;  Laterality: N/A;   REVERSE SHOULDER ARTHROPLASTY Right 08/19/2019   Procedure: REVERSE SHOULDER ARTHROPLASTY;  Surgeon: Beverely Low, MD;  Location: [facility] ORS;  Service: Orthopedics;  Laterality: Right;  interscalene block   SHOULDER SURGERY Right    "I BROKE MY SHOUDLER   THIGH SURGERY     "TO REMOVE A TUMOR "   Social History:  reports that she has never smoked. She has never used smokeless tobacco. She reports that she does not currently use alcohol. She reports that she does not use drugs.  Allergies  Allergen Reactions   Iodine Anaphylaxis   Ivp Dye [Iodinated Contrast Media] Anaphylaxis and Swelling    Throat closes    Latex Other (See Comments)    Latex tape pulls skin with it   Tizanidine Other (See Comments)    Weakness, goofy, bad dreams    Family History  Problem Relation Age of Onset   Diabetes Mother    Heart  disease Mother    Alzheimer's disease Father    Heart disease Sister        CABG   Diabetes Sister    Alcohol abuse Sister    Stroke Brother    Heart disease Brother    Mental illness Brother    Diabetes Brother    Breast cancer Neg Hx    Ovarian cancer Neg Hx    Colon cancer Neg Hx    Endometrial cancer Neg Hx     Prior to Admission medications   Medication Sig Start Date End Date Taking? Authorizing Provider  Acetaminophen (TYLENOL PO) Take 2 tablets by mouth as needed (pain/headache).    [provider]  albuterol (VENTOLIN HFA) 108 (90 Base) MCG/ACT inhaler Inhale 2 puffs into the lungs every 6 (six) hours as needed for wheezing or shortness of breath. 08/09/20   Sharee Holster, NP  allopurinol (ZYLOPRIM) 100 MG tablet Take 100 mg by mouth daily. 10/22/22   [provider]  apixaban (ELIQUIS) 5 MG TABS tablet Take 1 tablet (5 mg total) by mouth 2 (two) times daily. 09/09/22   Quintella Reichert,  MD  ARIPiprazole (ABILIFY) 2 MG tablet Take 2 mg by mouth at bedtime. 10/07/21   [provider]  BELSOMRA 20 MG TABS Take 1 tablet by mouth at bedtime as needed.    [provider]  Continuous Blood Gluc Sensor (DEXCOM G7 SENSOR) MISC Change sensor every 10 days Patient taking differently: 1 each by Other route See admin instructions. Change sensor every 10 days 05/20/22   Shamleffer, Konrad Dolores, MD  cyanocobalamin (VITAMIN B12) 1000 MCG tablet Take 1 tablet (1,000 mcg total) by mouth daily. 09/04/22   Ghimire, Werner Lean, MD  denosumab (PROLIA) 60 MG/ML SOSY injection Inject 60 mg into the skin every 6 (six) months. 08/14/20   Mechele Claude, MD  diphenhydrAMINE (BENADRYL) 50 MG tablet Take 1 tablet (50 mg total) by mouth once for 1 dose. Take 1 hour prior to CT scan 01/05/23 01/05/23  [name], MD  doxepin (SINEQUAN) 10 MG capsule Take 10 mg by mouth at bedtime. 11/27/22   [provider]  escitalopram (LEXAPRO) 10 MG tablet Take 10 mg by mouth  daily.    [provider]  fluticasone-salmeterol (ADVAIR DISKUS) 100-50 MCG/ACT AEPB Inhale 1 puff into the lungs 2 (two) times daily. 09/11/22   Parrett, Virgel Bouquet, NP  Galcanezumab-gnlm (EMGALITY) 120 MG/ML SOAJ INJECT ONCE A MONTH Patient taking differently: Inject 120 mg into the skin See admin instructions. INJECT ONCE A MONTH 10/31/22   Marcos Eke, PA-C  hydrochlorothiazide (MICROZIDE) 12.5 MG capsule Take 1 capsule (12.5 mg total) by mouth daily. 12/08/22   Mechele Claude, MD  insulin glargine (LANTUS SOLOSTAR) 100 UNIT/ML Solostar Pen Inject 35 Units into the skin 2 (two) times daily. 12/25/22   Mechele Claude, MD  insulin lispro (HUMALOG KWIKPEN) 100 UNIT/ML KwikPen Max daily 50 units Patient taking differently: Inject 4-8 Units into the skin 3 (three) times daily. 05/28/22   Shamleffer, Konrad Dolores, MD  Insulin Pen Needle 29G X MISC 1 Device by Does not apply route daily in the afternoon. 05/28/22   Shamleffer, Konrad Dolores, MD  lisinopril (ZESTRIL) 10 MG tablet Take 1 tablet (10 mg total) by mouth daily. 12/08/22   Mechele Claude, MD  LORazepam (ATIVAN) 1 MG tablet Take 0.5 tablets (0.5 mg total) by mouth 2 (two) times daily. 09/04/22   Ghimire, Werner Lean, MD  metFORMIN (GLUCOPHAGE-XR) 500 MG 24 hr tablet Take 2 tablets (1,000 mg total) by mouth daily with breakfast. 12/08/22   Shamleffer, Konrad Dolores, MD  metoprolol tartrate (LOPRESSOR) 50 MG tablet Take 1 tablet (50 mg total) by mouth 2 (two) times daily. 01/06/23   Quintella Reichert, MD  morphine (MS CONTIN) 15 MG 12 hr tablet Take 1 tablet (15 mg total) by mouth every 12 (twelve) hours. For chronic pain- can refill by 01/13/23 12/22/22   Genice Rouge, MD  morphine (MS CONTIN) 15 MG 12 hr tablet Take 1 tablet (15 mg total) by mouth every 12 (twelve) hours. Next fill 02/10/23 12/22/22   Lovorn, Aundra Millet, MD  naloxone [facility]) nasal spray 4 mg/0.1 mL Place 1 spray into the nose once. 08/08/21   [provider]   norethindrone (AYGESTIN) 5 MG tablet Take 2 tablets (10 mg total) by mouth daily. 12/30/22 01/29/23  [name], DO  nystatin (MYCOSTATIN/NYSTOP) powder Apply 1 Application topically as needed (rash). 06/16/22   Shamleffer, Konrad Dolores, MD  nystatin ointment (MYCOSTATIN) Apply 1 Application topically 2 (two) times daily. Patient taking differently: Apply 1 Application topically as needed (rash). 12/27/21  Cyril Mourning A, NP  ondansetron (ZOFRAN-ODT) 8 MG disintegrating tablet Take 1 tablet (8 mg total) by mouth every 6 (six) hours as needed for nausea or vomiting. 01/13/22   Mechele Claude, MD  OXcarbazepine (TRILEPTAL) 150 MG tablet Take 1 tablet (150 mg total) by mouth 2 (two) times daily. 06/13/22   Marguerita Merles Latif, DO  predniSONE (DELTASONE) 50 MG tablet Take one tablet at 13, 7 and 1 hour prior to CT scan. 01/05/23   [name], MD  pregabalin (LYRICA) 200 MG capsule Take 200 mg by mouth daily.    [provider]  pregabalin (LYRICA) 50 MG capsule Take 1 capsule (50 mg total) by mouth at bedtime. 12/25/22   Mechele Claude, MD  senna-docusate (SENOKOT-S) 8.6-50 MG tablet Take 1 tablet by mouth at bedtime. Patient taking differently: Take 1 tablet by mouth as needed for moderate constipation. 06/13/22   Sheikh, Omair Latif, DO  tirzepatide [facility]) 5 MG/0.5ML Pen Inject 5 mg into the skin once a week. 11/11/22   Shamleffer, Konrad Dolores, MD  Vitamin D, Cholecalciferol, 25 MCG (1000 UT) TABS Take 1,000 Units by mouth daily in the afternoon. 07/26/20   [provider]    Physical Exam: Vitals:   01/12/23 1730 01/12/23 1804 01/12/23 1810 01/12/23 1837  BP: (!) 132/105  (!) 92/55 110/68  Pulse: 96  71 86  Resp: 17  (!) 22 (!) 25  Temp:  98.3 F (36.8 C)    TempSrc:  Axillary    SpO2: 97%  96% 100%   Constitutional: NAD, calm, comfortable, obese female laying at approximately 20 degree incline in bed on Bipap Eyes: lids and conjunctivae normal ENMT:  Mucous membranes are moist. Neck: normal, supple Respiratory: clear to auscultation bilaterally, no wheezing, no crackles. Normal respiratory effort. No accessory muscle use. On Bipap.  Cardiovascular: Regular rate and rhythm, no murmurs / rubs / gallops. No extremity edema.  Abdomen: no tenderness, Bowel sounds positive.  Musculoskeletal: no clubbing / cyanosis. No joint deformity upper and lower extremities. Normal muscle tone.  Skin: no rashes, lesions, ulcers. No induration Neurologic: CN 2-12 grossly intact.  Psychiatric: Normal judgment and insight. Alert and oriented x 3. Anxious appearing.   Data Reviewed:  See HPI  Assessment and Plan: * Acute on chronic hypoxic respiratory failure (HCC) -secondary to flash pulmonary edema when reclined flat on scanner table today. Likely provoked by a.fib with RVR and acute on chronic HF.  -improved on Bipap. Repeat VBG with resolution of acidosis. Will trial off Bipap and keep on baseline 2L overnight.  -received IV Lasix 40mg . Will continue IV 40mg  Lasix daily.   Acute on chronic diastolic CHF (congestive heart failure)/HFpEF Exacerbation -last echo in June with EF of 65% and indeterminate diastolic dysfunction -reports dyspnea at rest and with exertion for several months prior to this incident  -she is on 20mg  Lasix at home. May need to increase dosage at home -continue IV Lasix 40mg  daily -follow intake and output, daily weights  Insulin dependent type 2 diabetes mellitus (HCC) Uncontrolled with hyperglycemia HA1C of 9.8 in July -home regimen on Lantus on 35 units BID -place on SSI   Chronic atrial fibrillation with RVR (HCC) -Presented with HR of 150 along with acute hypoxic respiratory failure secondary to flash pulmonary edema  -rate controlled on diltiazem infusion but discontinued due to hypotension. Rate now remains controlled off infusion.  -continue to monitor on telemetry with goal HR <110.  -May need to initial amiodarone  if rates  becomes rapid again.   Endometrial cancer (HCC) -Currently on Aygestin and follows with GYN/ONC Dr. Alvester Morin -she was undergoing CT A/P outpatient today to assess for any metastasis when she developed flash pulmonary edema  Metabolic acidosis -secondary to AKI -monitor while on IV diuresis  Hypotension -became hypotension while on diltiazem infusion and also received IV 40mg  Lasix. Infusion now discontinued.  -hold home antihypertensive overnight and can resume in the morning when stable -continue to monitor BP   AKI (acute kidney injury) (HCC) -creatinine of 1.21  -pt given IV 40mg  Lasix and subsequently 500cc bolus after hypotension with diltiazem infusion  -will hold home lisinopril and hydrochlorothiazide  -will be receiving daily IV diuresis  -avoid nephrotoxic agent when possible    Elevated troponin -Troponin of 56-377 in the setting to a.fib with RVR. Suspect likely demand ischemia but cardiology Dr. Duke Salvia recommends starting IV heparin and continue to trend troponin.Okay to stay in [place] as she is unlikely to need cardiac intervention.  -last heart catherization in 2019 with Nonobstructive CAD with left dominant circulation       Advance Care Planning:   Code Status: Full Code   Consults: cardiology  Family Communication: husband at bedside  Severity of Illness: The appropriate patient status for this patient is INPATIENT. Inpatient status is judged to be reasonable and necessary in order to provide the required intensity of service to ensure the patient's safety. The patient's presenting symptoms, physical exam findings, and initial radiographic and laboratory data in the context of their chronic comorbidities is felt to place them at high risk for further clinical deterioration. Furthermore, it is not anticipated that the patient will be medically stable for discharge from the hospital within 2 midnights of admission.   * I certify that at the point  of admission it is my clinical judgment that the patient will require inpatient hospital care spanning beyond 2 midnights from the point of admission due to high intensity of service, high risk for further deterioration and high frequency of surveillance required.*  Author: Anselm Jungling, DO 01/12/2023 8:43 PM  For on call review [email or web address].

## 2023-01-12 NOTE — Assessment & Plan Note (Addendum)
-  secondary to AKI -monitor while on IV diuresis

## 2023-01-12 NOTE — Assessment & Plan Note (Addendum)
-  became hypotension while on diltiazem infusion and also received IV 40mg  Lasix. Infusion now discontinued.  -hold home antihypertensive overnight and can resume in the morning when stable -continue to monitor BP

## 2023-01-12 NOTE — ED Provider Notes (Signed)
Patient signed out to me by previous provider. Please refer to their note for full HPI.  Briefly this is a 77 year old female who presented with acute shortness of breath, requiring BiPAP.  Initially acidotic with bilateral pulmonary opacities, concern for flash pulmonary edema.  She was diuresed.  History of atrial fibrillation, she is anticoagulated and rate controlled.  Was atrial fibrillation with RVR on arrival, currently on a Cardizem drip.  Patient signed out pending repeat VBG, repeat troponin and repeat EKG.  Repeat EKG the rate is improved.  However she does have a change in axis and slightly widening QRS.  Her troponin has increased from the 50s to the 300s.  Concern for ACS/demand ischemia.  For this reason I consulted cardiology, Dr. Duke Salvia.  We have reviewed her presentation and EKG.  They agree with heparinizing but we doubt primary ACS.  No indication for emergent transfer to [facility] at this time, no indication for emergent cath.  Doubt PE given that the patient is anticoagulated and compliant, last dose of Eliquis this morning.  Repeat VBG shows improving acidosis.  She continues to tolerate BiPAP well.  I was called to bedside as the patient's blood pressure was downtrending into the 70s systolic.  Cardizem drip was only at 5, this was discontinued.  A small fluid bolus was started and the blood pressure was rechecked and is back up to systolic of 110.  Heart rate remains controlled in the 70s, continues to be atrial fibrillation.  At this time patient is stable for admission to hospitalist here at [facility].  Patients evaluation and results requires admission for further treatment and care.  Spoke with hospitalist, reviewed patient's ED course and they accept admission.  Patient agrees with admission plan, offers no new complaints and is stable/unchanged at time of admit.  .Critical Care  Performed by: Rozelle Logan, DO Authorized by: Rozelle Logan, DO   Critical care  provider statement:    Critical care time (minutes):  30   Critical care was necessary to treat or prevent imminent or life-threatening deterioration of the following conditions:  Circulatory failure and respiratory failure   Critical care was time spent personally by me on the following activities:  Development of treatment plan with patient or surrogate, discussions with consultants, evaluation of patient's response to treatment, examination of patient, ordering and review of laboratory studies, ordering and review of radiographic studies, ordering and performing treatments and interventions, pulse oximetry, re-evaluation of patient's condition and review of old charts   I assumed direction of critical care for this patient from another provider in my specialty: yes     Care discussed with: admitting provider       Rozelle Logan, DO 01/12/23 1850

## 2023-01-12 NOTE — Assessment & Plan Note (Signed)
-  Currently on Aygestin and follows with GYN/ONC Dr. Alvester Morin -she was undergoing CT A/P outpatient today to assess for any metastasis when she developed flash pulmonary edema

## 2023-01-12 NOTE — ED Provider Notes (Addendum)
[place] EMERGENCY DEPARTMENT AT [facility] Provider Note   CSN: [id number] Arrival date & time: 01/12/23  1347     History  Chief Complaint  Patient presents with   Respiratory Distress   Tachycardia    NATISHA CASSADA is a 77 y.o. female.  HPI     77 y.o. female with past medical history of permanent atrial fibrillation (on Eliquis), HFpEF, HTN, HLD, Type 2 DM, Stage 2-3 CKD, asthma, OSA (intolerant to CPAP), Bipolar Disorder and CAD (nonobstructive CAD by cath in 01/2018), and recent discovery of uterine mass comes in with chief complaint of respiratory distress.  I was advised to come to the outpatient CT = where patient had come in for CT scan to do further diagnostic workup for her uterine mass.  Patient has history of anaphylaxis to contrast, therefore she was prepped for the CT.  Patient however decompensated as soon as she was laid flat, before any contrast was given.  Husband is at the bedside.  He indicates that patient has been taking all her medications.  She was doing well today including right before the CT and there was no anxiety when she had come in.  There has not been any fevers, chest pain, cough, URI-like symptoms.  Patient has not needed intubation in the past because of her lung disease or CHF.  Patient has been taking all her medications as prescribed.  She is normally in permanent A-fib.  Initially patient noted to be diaphoretic, tachycardic and tachypneic.  She is in respiratory failure with O2 sats in the 80s with nonrebreather.  We were able to get respiratory team to the outpatient CT scan and she was placed on BiPAP and moved to the emergency room.    Home Medications Prior to Admission medications   Medication Sig Start Date End Date Taking? Authorizing Provider  Acetaminophen (TYLENOL PO) Take 2 tablets by mouth as needed (pain/headache).    [provider]  albuterol (VENTOLIN HFA) 108 (90 Base) MCG/ACT inhaler Inhale 2  puffs into the lungs every 6 (six) hours as needed for wheezing or shortness of breath. 08/09/20   Sharee Holster, NP  allopurinol (ZYLOPRIM) 100 MG tablet Take 100 mg by mouth daily. 10/22/22   [provider]  apixaban (ELIQUIS) 5 MG TABS tablet Take 1 tablet (5 mg total) by mouth 2 (two) times daily. 09/09/22   Quintella Reichert, MD  ARIPiprazole (ABILIFY) 2 MG tablet Take 2 mg by mouth at bedtime. 10/07/21   [provider]  BELSOMRA 20 MG TABS Take 1 tablet by mouth at bedtime as needed.    [provider]  Continuous Blood Gluc Sensor (DEXCOM G7 SENSOR) MISC Change sensor every 10 days Patient taking differently: 1 each by Other route See admin instructions. Change sensor every 10 days 05/20/22   Shamleffer, Konrad Dolores, MD  cyanocobalamin (VITAMIN B12) 1000 MCG tablet Take 1 tablet (1,000 mcg total) by mouth daily. 09/04/22   Ghimire, Werner Lean, MD  denosumab (PROLIA) 60 MG/ML SOSY injection Inject 60 mg into the skin every 6 (six) months. 08/14/20   Mechele Claude, MD  diphenhydrAMINE (BENADRYL) 50 MG tablet Take 1 tablet (50 mg total) by mouth once for 1 dose. Take 1 hour prior to CT scan 01/05/23 01/05/23  [name], MD  doxepin (SINEQUAN) 10 MG capsule Take 10 mg by mouth at bedtime. 11/27/22   [provider]  escitalopram (LEXAPRO) 10 MG tablet Take 10 mg by mouth daily.  [provider]  fluticasone-salmeterol (ADVAIR DISKUS) 100-50 MCG/ACT AEPB Inhale 1 puff into the lungs 2 (two) times daily. 09/11/22   Parrett, Virgel Bouquet, NP  Galcanezumab-gnlm (EMGALITY) 120 MG/ML SOAJ INJECT ONCE A MONTH Patient taking differently: Inject 120 mg into the skin See admin instructions. INJECT ONCE A MONTH 10/31/22   Marcos Eke, PA-C  hydrochlorothiazide (MICROZIDE) 12.5 MG capsule Take 1 capsule (12.5 mg total) by mouth daily. 12/08/22   Mechele Claude, MD  insulin glargine (LANTUS SOLOSTAR) 100 UNIT/ML Solostar Pen Inject 35 Units into the skin 2 (two) times  daily. 12/25/22   Mechele Claude, MD  insulin lispro (HUMALOG KWIKPEN) 100 UNIT/ML KwikPen Max daily 50 units Patient taking differently: Inject 4-8 Units into the skin 3 (three) times daily. 05/28/22   Shamleffer, Konrad Dolores, MD  Insulin Pen Needle 29G X MISC 1 Device by Does not apply route daily in the afternoon. 05/28/22   Shamleffer, Konrad Dolores, MD  lisinopril (ZESTRIL) 10 MG tablet Take 1 tablet (10 mg total) by mouth daily. 12/08/22   Mechele Claude, MD  LORazepam (ATIVAN) 1 MG tablet Take 0.5 tablets (0.5 mg total) by mouth 2 (two) times daily. 09/04/22   Ghimire, Werner Lean, MD  metFORMIN (GLUCOPHAGE-XR) 500 MG 24 hr tablet Take 2 tablets (1,000 mg total) by mouth daily with breakfast. 12/08/22   Shamleffer, Konrad Dolores, MD  metoprolol tartrate (LOPRESSOR) 50 MG tablet Take 1 tablet (50 mg total) by mouth 2 (two) times daily. 01/06/23   Quintella Reichert, MD  morphine (MS CONTIN) 15 MG 12 hr tablet Take 1 tablet (15 mg total) by mouth every 12 (twelve) hours. For chronic pain- can refill by 01/13/23 12/22/22   Genice Rouge, MD  morphine (MS CONTIN) 15 MG 12 hr tablet Take 1 tablet (15 mg total) by mouth every 12 (twelve) hours. Next fill 02/10/23 12/22/22   Lovorn, Aundra Millet, MD  naloxone [facility]) nasal spray 4 mg/0.1 mL Place 1 spray into the nose once. 08/08/21   [provider]  norethindrone (AYGESTIN) 5 MG tablet Take 2 tablets (10 mg total) by mouth daily. 12/30/22 01/29/23  [name], DO  nystatin (MYCOSTATIN/NYSTOP) powder Apply 1 Application topically as needed (rash). 06/16/22   Shamleffer, Konrad Dolores, MD  nystatin ointment (MYCOSTATIN) Apply 1 Application topically 2 (two) times daily. Patient taking differently: Apply 1 Application topically as needed (rash). 12/27/21   Adline Potter, NP  ondansetron (ZOFRAN-ODT) 8 MG disintegrating tablet Take 1 tablet (8 mg total) by mouth every 6 (six) hours as needed for nausea or vomiting. 01/13/22   Mechele Claude, MD   OXcarbazepine (TRILEPTAL) 150 MG tablet Take 1 tablet (150 mg total) by mouth 2 (two) times daily. 06/13/22   Marguerita Merles Latif, DO  predniSONE (DELTASONE) 50 MG tablet Take one tablet at 13, 7 and 1 hour prior to CT scan. 01/05/23   [name], MD  pregabalin (LYRICA) 200 MG capsule Take 200 mg by mouth daily.    [provider]  pregabalin (LYRICA) 50 MG capsule Take 1 capsule (50 mg total) by mouth at bedtime. 12/25/22   Mechele Claude, MD  senna-docusate (SENOKOT-S) 8.6-50 MG tablet Take 1 tablet by mouth at bedtime. Patient taking differently: Take 1 tablet by mouth as needed for moderate constipation. 06/13/22   Sheikh, Omair Latif, DO  tirzepatide [facility]) 5 MG/0.5ML Pen Inject 5 mg into the skin once a week. 11/11/22   Shamleffer, Konrad Dolores, MD  Vitamin D, Cholecalciferol, 25 MCG (1000  UT) TABS Take 1,000 Units by mouth daily in the afternoon. 07/26/20   [provider]      Allergies    Iodine, Ivp dye [iodinated contrast media], Latex, and Tizanidine    Review of Systems   Review of Systems  All other systems reviewed and are negative.   Physical Exam Updated Vital Signs BP 100/68   Pulse 90   Resp (!) 30   SpO2 98%  Physical Exam Vitals and nursing note reviewed.  Constitutional:      General: She is in acute distress.     Appearance: She is well-developed. She is ill-appearing and diaphoretic.  HENT:     Head: Atraumatic.  Cardiovascular:     Rate and Rhythm: Tachycardia present. Rhythm irregular.  Pulmonary:     Effort: Respiratory distress present.     Breath sounds: Rales present.  Musculoskeletal:     Cervical back: Neck supple.     Right lower leg: No edema.     Left lower leg: No edema.  Skin:    General: Skin is warm.  Neurological:     Mental Status: She is alert and oriented to person, place, and time.     ED Results / Procedures / Treatments   Labs (all labs ordered are listed, but only abnormal results are  displayed) Labs Reviewed  BASIC METABOLIC PANEL - Abnormal; Notable for the following components:      Result Value   Sodium 131 (*)    Chloride 94 (*)    CO2 18 (*)    Glucose, Bld 487 (*)    BUN 27 (*)    Creatinine, Ser 1.21 (*)    GFR, Estimated 47 (*)    Anion gap 19 (*)    All other components within normal limits  CBC - Abnormal; Notable for the following components:   WBC 16.8 (*)    RBC 5.38 (*)    Hemoglobin 15.3 (*)    HCT 49.2 (*)    All other components within normal limits  BRAIN NATRIURETIC PEPTIDE - Abnormal; Notable for the following components:   B Natriuretic Peptide 446.1 (*)    All other components within normal limits  BLOOD GAS, VENOUS - Abnormal; Notable for the following components:   pH, Ven 7.15 (*)    Acid-base deficit 9.2 (*)    All other components within normal limits  CBG MONITORING, ED - Abnormal; Notable for the following components:   Glucose-Capillary 496 (*)    All other components within normal limits  TROPONIN I (HIGH SENSITIVITY) - Abnormal; Notable for the following components:   Troponin I (High Sensitivity) 56 (*)    All other components within normal limits  MAGNESIUM  BLOOD GAS, VENOUS  TROPONIN I (HIGH SENSITIVITY)    EKG EKG Interpretation Date/Time:  Monday January 12 2023 13:57:25 EDT Ventricular Rate:  144 PR Interval:    QRS Duration:  108 QT Interval:  292 QTC Calculation: 452 R Axis:   -69  Text Interpretation: Atrial fibrillation Abnormal R-wave progression, early transition Left ventricular hypertrophy Inferior infarct, old Anterior Q waves, possibly due to LVH Abnrm T, consider ischemia, anterolateral lds Borderline ST elevation, lateral leads Confirmed by Derwood Kaplan ([id number]) on 01/12/2023 3:52:42 PM  Radiology DG Chest Port 1 View  Result Date: 01/12/2023 CLINICAL DATA:  shob EXAM: PORTABLE CHEST 1 VIEW COMPARISON:  CXR 11/26/22 FINDINGS: Pleural effusion. No pneumothorax. Unchanged cardiac and mediastinal  contours. Compared to prior exam there are new hazy  opacities in the bilateral mid lung fields, which could represent pulmonary edema or multifocal infection. No radiographically apparent displaced rib fractures. Visualized upper abdomen unremarkable. Right shoulder arthroplasty. IMPRESSION: Compared to prior exam there are new hazy opacities in the bilateral mid lung fields, which could represent pulmonary edema or multifocal infection. Electronically Signed   By: Lorenza Cambridge M.D.   On: 01/12/2023 14:36    Procedures .Critical Care  Performed by: Derwood Kaplan, MD Authorized by: Derwood Kaplan, MD   Critical care provider statement:    Critical care time (minutes):  85   Critical care was necessary to treat or prevent imminent or life-threatening deterioration of the following conditions:  Circulatory failure and respiratory failure   Critical care was time spent personally by me on the following activities:  Development of treatment plan with patient or surrogate, discussions with consultants, evaluation of patient's response to treatment, examination of patient, ordering and review of laboratory studies, ordering and review of radiographic studies, ordering and performing treatments and interventions, pulse oximetry, re-evaluation of patient's condition, review of old charts and obtaining history from patient or surrogate     Medications Ordered in ED Medications  diltiazem (CARDIZEM) 1 mg/mL load via infusion 20 mg (20 mg Intravenous Bolus from Bag 01/12/23 1434)    And  diltiazem (CARDIZEM) 125 mg in dextrose 5% 125 mL (1 mg/mL) infusion (5 mg/hr Intravenous New Bag/Given 01/12/23 1434)  LORazepam (ATIVAN) injection 1 mg (1 mg Intravenous Given 01/12/23 1439)  furosemide (LASIX) injection 40 mg (40 mg Intravenous Given 01/12/23 1459)    ED Course/ Medical Decision Making/ A&P                                 Medical Decision Making Amount and/or Complexity of Data Reviewed Labs:  ordered. Radiology: ordered.  Risk Prescription drug management. Decision regarding hospitalization.   77 year old female comes in with chief complaint of acute shortness of breath.  She has pertinent past medical history of CHF, CAD, CKD, asthma and a recent diagnosis of uterine mass for which patient had come in for CT scan.  Of note, patient has history of anaphylaxis to contrast, and had been prepped for the CT scan.  Patient however started decompensating before contrast was introduced.  Upon arrival, patient noted to be diaphoretic, tripoding, tachycardic and tachypneic.  She was in acute hypoxic respiratory failure.  We called respiratory team and place patient on BiPAP.  She has rales on exam, concerns for flash pulmonary edema.  Patient's BP was over 190 systolic.  Patient also found to be in A-fib with RVR with pulse in the 150s.  Patient was moved to the emergency room where she continued on BiPAP.  Patient was able to follow commands and calm herself down.  It was unclear if patient's tachycardia was because of pulmonary edema and fight or flight mode.  We allowed patient to stabilize her BiPAP, and patient was monitored closely.  Over time her respiratory status improved.  She remained tachycardic however.  X-ray of the chest was completed, it confirmed the suspicion of flash pulmonary edema.  Patient's BP improved over time while she was on the BiPAP.   Patient reassessed on 3 separate occasions.  After the second reassessment, plan was made to initiate diltiazem drip.  Patient's heart rate has responded to the dill drip.  Additionally, her tachypnea has improved significantly and she looks to be a lot comfortable.  She also had received Ativan, which might have assisted.  At 3:45 PM, patient's care will be signed out to incoming team.  She at this time is comfortable however her labs that were drawn initially show pH of 7.15, with bicarb that is only slightly low at 18.  She  likely has mixed respiratory and lactic acidosis from work of breathing and fatigue.  Repeat venous blood gas has been ordered.  Her workup also reveals elevated troponin of 56, which is likely because of demand ischemia.  Repeat troponin pending.  Repeat EKG has been ordered.  Patient's creatinine is at baseline normal.  White count is elevated, but it is an acute marker for stress and not infection in her case.  Family confirms that patient was doing well when she came for CT scan.  She has received 40 mg of IV Lasix.  Plan is for patient to be reassessed after venous blood gas repeat and repeat troponin.  She remains on BiPAP for now.   Final Clinical Impression(s) / ED Diagnoses Final diagnoses:  Acute hypoxic respiratory failure (HCC)  Atrial fibrillation with rapid ventricular response (HCC)  Acute pulmonary edema [facility])    Rx / DC Orders ED Discharge Orders     None         Derwood Kaplan, MD 01/12/23 1554    Derwood Kaplan, MD 01/12/23 1554

## 2023-01-12 NOTE — Progress Notes (Signed)
   01/12/23 1340  BiPAP/CPAP/SIPAP  $ Non-Invasive Ventilator  Non-Invasive Vent Set Up;Non-Invasive Vent Initial  $ Face Mask Medium Yes  BiPAP/CPAP/SIPAP Pt Type Adult  BiPAP/CPAP/SIPAP V60  Mask Type Full face mask  Mask Size Medium  Set Rate (S)  18 breaths/min  Respiratory Rate 36 breaths/min  IPAP (S)  18 cmH20  EPAP (S)  6 cmH2O  FiO2 (%) (S)  100 %  Flow Rate  (I Time 1.00, Rise 3.)  Minute Ventilation 29.2  Leak 0  Peak Inspiratory Pressure (PIP) 22  Tidal Volume (Vt) 801  Patient Home Equipment No  Auto Titrate No  Press High Alarm 35 cmH2O  Press Low Alarm 5 cmH2O  Nasal massage performed No (comment)  CPAP/SIPAP surface wiped down Yes  BiPAP/CPAP /SiPAP Vitals  Pulse Rate (!) 150  Resp (!) 36  SpO2 96 %  Bilateral Breath Sounds Diminished  MEWS Score/Color  MEWS Score 6  MEWS Score Color Red

## 2023-01-12 NOTE — Assessment & Plan Note (Addendum)
-  secondary to flash pulmonary edema when reclined flat on scanner table today. Likely provoked by a.fib with RVR and acute on chronic HF.  -improved on Bipap. Repeat VBG with resolution of acidosis. Will trial off Bipap and keep on baseline 2L overnight.  -received IV Lasix 40mg . Will continue IV 40mg  Lasix daily.

## 2023-01-12 NOTE — Assessment & Plan Note (Addendum)
-  Presented with HR of 150 along with acute hypoxic respiratory failure secondary to flash pulmonary edema  -rate controlled on diltiazem infusion but discontinued due to hypotension. Rate now remains controlled off infusion.  -continue to monitor on telemetry with goal HR <110.  -May need to initial amiodarone if rates becomes rapid again.

## 2023-01-12 NOTE — Progress Notes (Signed)
ANTICOAGULATION CONSULT NOTE - Initial Consult  Pharmacy Consult for heparin Indication: chest pain/ACS, hx afib (on apixaban)  Allergies  Allergen Reactions   Iodine Anaphylaxis   Ivp Dye [Iodinated Contrast Media] Anaphylaxis and Swelling    Throat closes    Latex Other (See Comments)    Latex tape pulls skin with it   Tizanidine Other (See Comments)    Weakness, goofy, bad dreams    Patient Measurements:   Heparin Dosing Weight: 70 kg  Vital Signs: Temp: 98.3 F (36.8 C) (08/12 1804) Temp Source: Axillary (08/12 1804) BP: 92/55 (08/12 1810) Pulse Rate: 71 (08/12 1810)  Labs: Recent Labs    01/12/23 1415 01/12/23 1604  HGB 15.3*  --   HCT 49.2*  --   PLT 277  --   CREATININE 1.21*  --   TROPONINIHS 56* 377*    Estimated Creatinine Clearance: 41 mL/min (A) (by C-G formula based on SCr of 1.21 mg/dL (H)).   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   Benign paroxysmal positional vertigo due to bilateral vestibular disorder 05/20/2019   Bipolar 1 disorder (HCC) 01/23/2015   with GAD, benzo dependence   BPPV (benign paroxysmal positional vertigo) 05/20/2019   CAD (coronary artery disease)    Nonobstructive; Managed by Dr. Purvis Sheffield   Cardiomegaly 01/12/2018   CHF (congestive heart failure) 06/08/2022   Chronic back pain 01/04/2015   Chronic constipation 04/25/2020   Chronic diastolic heart failure (HCC) 05/30/2015   Chronic pain syndrome 08/22/2019   back pain, sacroiliitis   Chronic pain syndrome 08/22/2019   Chronic post-traumatic stress disorder (PTSD) 12/06/2020   Diabetic neuropathy  (HCC) 02/06/2016   Dyslipidemia 09/24/2020   Dyspnea on exertion    Essential hypertension    Family history of coronary arteriosclerosis 05/30/2015   Functional diarrhea 10/26/2020   Head trauma 09/17/2020   Hemorrhoids 03/11/2022   Herpes genitalis in women 07/16/2015   History of adenomatous polyp of colon 05/21/2019   Overview:   03/31/17: Colonoscopy: nonadvanced adenoma, microscopic colitis, f/u 5 yrs, [facility]/GAP   Insomnia 01/23/2015   Lipoma 02/08/2015   Migraine headache with aura 02/12/2016   Myofascial pain dysfunction syndrome 08/22/2019   Myofascial pain dysfunction syndrome 08/22/2019   Non-alcoholic fatty liver disease 01/12/2018   Opioid dependence (HCC) 12/06/2020   OSA (obstructive sleep apnea) 02/24/2019   10/09/2018 - HST  - AHI 40.6    Osteopenia 12/06/2020   Rx alendronate 35 mg.   Pinguecula 12/06/2020   Postcoital bleeding 12/06/2020   Presbyopia 12/06/2020   Pulmonary hypertension    RLS (restless legs syndrome) 04/27/2015   Superficial fungus infection of skin 09/03/2021   Tremor, essential 12/11/2021   Type II diabetes mellitus (HCC) 09/24/2020    Medications: Apixaban 5 mg PO BID PTA for afib Last dose: 8/12 between 08:00 - 09:00.  Assessment: Pt is a 77 yoF chronically anticoagulated with apixaban for history of atrial fibrillation. Pt presents with tachycardia, respiratory distress > placed on Bipap. Pharmacy consulted to dose heparin for ACS/afib.  Today, 01/12/23 CBC: Hgb 15.3, Plt 277 on admission STAT baseline heparin level, aPTT ordered. Expect baseline heparin level to be falsely elevated due  to DOAC  Goal of Therapy:  Heparin level 0.3-0.7 units/ml aPTT 66-102 seconds Monitor platelets by anticoagulation protocol: Yes   Plan:  No initial bolus since pt took apixaban this morning Initiate heparin infusion at 1000 units/hr Check 8 hour aPTT CBC, heparin level/aPTT daily. Once heparin level and aPTT correlate, can monitor using  heparin level only Monitor for signs of bleeding Follow for ability to resume apixaban  Cindi Carbon, PharmD 01/12/2023,6:34 PM

## 2023-01-12 NOTE — Assessment & Plan Note (Addendum)
Uncontrolled with hyperglycemia HA1C of 9.8 in July -home regimen on Lantus on 35 units BID -place on SSI

## 2023-01-12 NOTE — Assessment & Plan Note (Addendum)
-  Troponin of 56-377 in the setting to a.fib with RVR. Suspect likely demand ischemia but cardiology Dr. Duke Salvia recommends starting IV heparin and continue to trend troponin.Okay to stay in [place] as she is unlikely to need cardiac intervention.  -last heart catherization in 2019 with Nonobstructive CAD with left dominant circulation

## 2023-01-13 ENCOUNTER — Telehealth: Payer: Self-pay

## 2023-01-13 ENCOUNTER — Inpatient Hospital Stay (HOSPITAL_COMMUNITY): Payer: Medicare Other

## 2023-01-13 DIAGNOSIS — R7989 Other specified abnormal findings of blood chemistry: Secondary | ICD-10-CM | POA: Diagnosis not present

## 2023-01-13 DIAGNOSIS — E119 Type 2 diabetes mellitus without complications: Secondary | ICD-10-CM | POA: Diagnosis not present

## 2023-01-13 DIAGNOSIS — I2510 Atherosclerotic heart disease of native coronary artery without angina pectoris: Secondary | ICD-10-CM

## 2023-01-13 DIAGNOSIS — I2583 Coronary atherosclerosis due to lipid rich plaque: Secondary | ICD-10-CM

## 2023-01-13 DIAGNOSIS — I160 Hypertensive urgency: Secondary | ICD-10-CM

## 2023-01-13 DIAGNOSIS — J9621 Acute and chronic respiratory failure with hypoxia: Secondary | ICD-10-CM | POA: Diagnosis not present

## 2023-01-13 DIAGNOSIS — I5033 Acute on chronic diastolic (congestive) heart failure: Secondary | ICD-10-CM | POA: Diagnosis not present

## 2023-01-13 DIAGNOSIS — E0810 Diabetes mellitus due to underlying condition with ketoacidosis without coma: Secondary | ICD-10-CM

## 2023-01-13 DIAGNOSIS — D72829 Elevated white blood cell count, unspecified: Secondary | ICD-10-CM

## 2023-01-13 DIAGNOSIS — I482 Chronic atrial fibrillation, unspecified: Secondary | ICD-10-CM | POA: Diagnosis not present

## 2023-01-13 LAB — HEPATIC FUNCTION PANEL
ALT: 16 U/L (ref 0–44)
AST: 19 U/L (ref 15–41)
Albumin: 3.3 g/dL — ABNORMAL LOW (ref 3.5–5.0)
Alkaline Phosphatase: 53 U/L (ref 38–126)
Bilirubin, Direct: 0.2 mg/dL (ref 0.0–0.2)
Indirect Bilirubin: 0.3 mg/dL (ref 0.3–0.9)
Total Bilirubin: 0.5 mg/dL (ref 0.3–1.2)
Total Protein: 6.6 g/dL (ref 6.5–8.1)

## 2023-01-13 LAB — APTT
aPTT: 42 seconds — ABNORMAL HIGH (ref 24–36)
aPTT: 51 seconds — ABNORMAL HIGH (ref 24–36)

## 2023-01-13 LAB — PHOSPHORUS: Phosphorus: 3.1 mg/dL (ref 2.5–4.6)

## 2023-01-13 LAB — GLUCOSE, CAPILLARY
Glucose-Capillary: 309 mg/dL — ABNORMAL HIGH (ref 70–99)
Glucose-Capillary: 248 mg/dL — ABNORMAL HIGH (ref 70–99)

## 2023-01-13 LAB — LIPID PANEL
Cholesterol: 200 mg/dL (ref 0–200)
HDL: 32 mg/dL — ABNORMAL LOW (ref >40)
LDL Cholesterol: 141 mg/dL — ABNORMAL HIGH (ref 0–99)
Total CHOL/HDL Ratio: 6.3 RATIO
Triglycerides: 134 mg/dL (ref <150)
VLDL: 27 mg/dL (ref 0–40)

## 2023-01-13 LAB — PROCALCITONIN: Procalcitonin: <0.1 ng/mL

## 2023-01-13 LAB — MAGNESIUM: Magnesium: 2.2 mg/dL (ref 1.7–2.4)

## 2023-01-13 LAB — BETA-HYDROXYBUTYRIC ACID: Beta-Hydroxybutyric Acid: 0.09 mmol/L (ref 0.05–0.27)

## 2023-01-13 LAB — LACTIC ACID, PLASMA: Lactic Acid, Venous: 2.8 mmol/L (ref 0.5–1.9)

## 2023-01-13 MED ORDER — ASPIRIN 81 MG PO TBEC
81.0000 mg | DELAYED_RELEASE_TABLET | Freq: Every day | ORAL | Status: DC
  Administered 2023-01-13 – 2023-01-14 (×2): 81 mg via ORAL
  Filled 2023-01-13 (×2): qty 1

## 2023-01-13 MED ORDER — BARIUM SULFATE 2 % PO SUSP
450.0000 mL | Freq: Once | ORAL | Status: AC
  Administered 2023-01-13: 450 mL via ORAL

## 2023-01-13 MED ORDER — ATORVASTATIN CALCIUM 40 MG PO TABS
40.0000 mg | ORAL_TABLET | Freq: Every day | ORAL | Status: DC
  Administered 2023-01-13: 40 mg via ORAL
  Filled 2023-01-13: qty 1

## 2023-01-13 MED ORDER — HEPARIN (PORCINE) 25000 UT/250ML-% IV SOLN
1550.0000 [IU]/h | INTRAVENOUS | Status: DC
  Administered 2023-01-13: 1400 [IU]/h via INTRAVENOUS
  Administered 2023-01-14: 1550 [IU]/h via INTRAVENOUS
  Filled 2023-01-13 (×2): qty 250

## 2023-01-13 MED ORDER — INSULIN GLARGINE-YFGN 100 UNIT/ML ~~LOC~~ SOLN
20.0000 [IU] | Freq: Two times a day (BID) | SUBCUTANEOUS | Status: DC
  Administered 2023-01-13 – 2023-01-16 (×7): 20 [IU] via SUBCUTANEOUS
  Filled 2023-01-13 (×8): qty 0.2

## 2023-01-13 MED ORDER — HEPARIN BOLUS VIA INFUSION
2000.0000 [IU] | Freq: Once | INTRAVENOUS | Status: AC
  Administered 2023-01-13: 2000 [IU] via INTRAVENOUS
  Filled 2023-01-13: qty 2000

## 2023-01-13 MED ORDER — LORAZEPAM 0.5 MG PO TABS
0.5000 mg | ORAL_TABLET | Freq: Three times a day (TID) | ORAL | Status: DC
  Administered 2023-01-13 – 2023-01-18 (×15): 0.5 mg via ORAL
  Filled 2023-01-13 (×15): qty 1

## 2023-01-13 MED ORDER — LORAZEPAM 0.5 MG PO TABS
0.5000 mg | ORAL_TABLET | Freq: Two times a day (BID) | ORAL | Status: DC
  Administered 2023-01-13: 0.5 mg via ORAL
  Filled 2023-01-13: qty 1

## 2023-01-13 NOTE — Progress Notes (Signed)
PROGRESS NOTE    Amber Stephenson  YQM:[id number] DOB: 11-25-1945 DOA: 01/12/2023 PCP: Mechele Claude, MD   Brief Narrative:  HPI per Dr. Benita Gutter on 01/12/23 Amber Stephenson is a 77 y.o. female with medical history significant of Chronic diastolic heart failure, chronic atrial fibrillation on Eliquis, Chronic hypoxic respiratory failure on nocturnal 2L, HTN, insulin -dependent T2DM, HTN, HLD, bipolar 1 disorder, recent diagnosis of Grade 1 endometrioid cancer who presents from with acute hypoxic respiratory failure.     Pt recently diagnosed with endometrioid cancer and was getting CT imaging done today to further assess. However upon laying flat she developed acute respiratory distress. She has hx of anaphylaxis to IV contrast but this was not yet given. She became diaphoretic, tachycardic and tachynpeic and was placed initially on HFNC 15L with SpO2 of 85%. ED physician was called to evaluate and she was transitioned onto Bipap. Found to have rales on exam concerning for flash pulmonary edema. Also found to be in A.fib with RVR with HR 150s.  CXR was obtained which confirmed suspicion of pulmonary edema. ABG was acidotic with ph of 7.15 with CO2 of 59 and bicarb of 20.    After multiple re-evaluation, decision was made to start diltiazem infusion and her rates improved. She was also given IV 40mg  Lasix. Blood pressure however later trended down to SBP of 80s and diltiazem infusion was discontinued. Small 500cc bolus was given.    Her first troponin was elevated to 56 and though to be due to demand ischemic. However second troponin had significant delta change to 377. Cardiology Dr. Duke Salvia consulted and recommended starting IV heparin and continue to trend troponin although still likely to be due to demand ischemia. Pt was okay to stay here at [facility] long rather than [facility] as she is unlikely to require any intervention.    Pt reports that for the past several months she has noticed  increasing shortness of breath both at rest and with exertion. No lower extremity edema. No chest pain. She is complaint on her 20mg  Lasix. She normally has to sleep reclined at home and this is first time she had to lay completely flat on scanner table.   **Interim History  Cardiology was consulted for further evaluation she remains on heparin drip.  We are obtaining a CT scan of the chest and will also add on a CT scan of the abdomen pelvis.  Diabetes education has been consulted.   Assessment and Plan:  Acute on chronic hypoxic respiratory failure (HCC) -Secondary to flash pulmonary edema when reclined flat on scanner table today vs PNA -Likely provoked by a.fib with RVR and acute on chronic HF.  -Improved on Bipap. Repeat VBG with resolution of acidosis.  SpO2: 91 % O2 Flow Rate (L/min): 3 L/min FiO2 (%): 40 % -Will trial off Bipap and keep on baseline 2L overnight.  -WBC Trend: Recent Labs  Lab 12/25/22 1030 01/12/23 1415 01/13/23 0340  WBC 10.0 16.8* 18.3*  -She did Receive Steroids for CT Abd/Pelvis that was going to be done in the Outpatient setting  -Hold off on Abx currently but if spikes of temperature or worsening leukocytosis may need to initiate antibiotics -received IV Lasix 40mg . Will continue IV 40mg  Lasix daily.  -CT Chest done and showed " Perihilar ground-glass infiltrates in the lungs likely representing edema or multifocal pneumonia. No evidence of metastatic disease seen in the chest, abdomen, or pelvis on noncontrast imaging. Aortic atherosclerosis. Multiple chronic appearing endplate compression  deformities in the spine likely indicate osteoporosis." -Cardiology consulted for further evaluation   Acute on Chronic Diastolic CHF (congestive heart failure)/HFpEF Exacerbation -Last echo in June with EF of 65% and indeterminate diastolic dysfunction -reports dyspnea at rest and with exertion for several months prior to this incident  -She is on 20mg  Lasix at home.  May need to increase dosage at home -continue IV Lasix 40mg  daily -Strict intake and output, daily weights;  Intake/Output Summary (Last 24 hours) at 01/13/2023 2131 Last data filed at 01/13/2023 1902 Gross per 24 hour  Intake 691.86 ml  Output 700 ml  Net -8.14 ml  -Continue to Monitor for S/Sx of Volume overload    Insulin dependent type 2 diabetes mellitus (HCC) Uncontrolled with hyperglycemia -HA1C of 9.8 in July -home regimen on Lantus on 35 units BID but will place on 20 units BID -place on SSI  -CBG Trend: Recent Labs  Lab 01/12/23 1331 01/12/23 1354 01/12/23 2206 01/12/23 2310 01/13/23 0804 01/13/23 1144  GLUCAP 383* 496* 479* 405* 309* 248*    Chronic atrial fibrillation with RVR (HCC) -Presented with HR of 150 along with acute hypoxic respiratory failure secondary to flash pulmonary edema  -rate controlled on diltiazem infusion but discontinued due to hypotension. Rate now remains controlled off infusion.  -continue to monitor on telemetry with goal HR <110.  -May need to initial amiodarone if rates becomes rapid again.  -Cardiology consulted and continue to Monitor in the Progressive [facility]   Endometrial cancer [facility]) -Currently on Aygestin and follows with GYN/ONC Dr. Alvester Morin -she was undergoing CT A/P outpatient today to assess for any metastasis when she developed flash pulmonary edema -Obtained CT Abd/Pelvis here without contrast and it showed "Perihilar ground-glass infiltrates in the lungs likely representing edema or multifocal pneumonia. No evidence of metastatic disease seen in the chest, abdomen, or pelvis on noncontrast imaging. Aortic atherosclerosis. Multiple chronic appearing endplate compression deformities in the spine likely indicate osteoporosis."   Metabolic acidosis and lactic acidosis -secondary to AKI and hypoperfusion -monitor while on IV diuresis Recent Labs  Lab 01/13/23 1158  LATICACIDVEN 2.8*  -Patient has a CO2 of 19, chloride level of  98, anion gap of 11 -Continue monitor trend and repeat CMP in a.m.   Hypotension -became hypotension while on diltiazem infusion and also received IV 40mg  Lasix. Infusion now discontinued.  -hold home antihypertensive overnight and can resume in the morning when stable -continue to monitor BP -Appreciate cardiology assistance    AKI (acute kidney injury) (HCC) -Creatinine of 1.21  -pt given IV 40mg  Lasix and subsequently 500cc bolus after hypotension with diltiazem infusion  -BUN/Cr Trend: Recent Labs  Lab 01/12/23 1415 01/13/23 0340  BUN 27* 38*  CREATININE 1.21* 1.14*  -Avoid Nephrotoxic Medications, Contrast Dyes, Hypotension and Dehydration to Ensure Adequate Renal Perfusion and will need to Renally Adjust Meds -Continue to Monitor and Trend Renal Function carefully and repeat CMP in the AM  -Will hold home lisinopril and hydrochlorothiazide    Elevated Troponin Non-obstructive CAD HLD -Troponin of 56 -> 377 and trended up to 852 in the setting to a.fib with RVR. Suspect likely demand ischemia but cardiology Dr. Duke Salvia recommends starting IV heparin and continue to trend troponin. -Okay to stay in [facility] as she is unlikely to need cardiac intervention.  -last heart catherization in 2019 with Nonobstructive CAD with left dominant circulation  -Lipid panel done and showed an LDL of 141 -Was started on atorvastatin and cardiology feels that this is demand  ischemia in the setting of acute hypoxemia, acute CHF and hypertensive urgency with A-fib with RVR  Anxiety -Resume her home lorazepam dose  Hypoalbuminemia -Patient's Albumin Trend: Recent Labs  Lab 01/13/23 0340  ALBUMIN 3.3*  -Continue to Monitor and Trend and repeat CMP in the AM   Obesity -Complicates overall prognosis and care -Estimated body mass index is 35.36 kg/m as calculated from the following:   Height as of this encounter: 5\' 2"  (1.575 m).   Weight as of this encounter: 87.7 kg.  -Weight Loss  and Dietary Counseling given    DVT prophylaxis: Anticoagulated with a heparin drip    Code Status: Full Code Family Communication: Discussed with patient's husband at bedside  Disposition Plan:  Level of care: Stepdown Status is: Inpatient Remains inpatient appropriate because: Needs further clinical improvement and workup   Consultants:  Cardiology   Procedures:  As delineated as above  Antimicrobials:  Anti-infectives (From admission, onward)    None       Subjective: Seen and examined at bedside and she is feeling better.  Continues to be a little dyspneic but no nausea or vomiting.  Denies any other concerns requested this time.  Objective: Vitals:   01/13/23 1500 01/13/23 1600 01/13/23 1853 01/13/23 2131  BP: (!) 121/101 (!) 143/119  109/62  Pulse: 79 87    Resp: 20 (!) 23    Temp:  97.6 F (36.4 C) (!) 97.5 F (36.4 C)   TempSrc:  Oral Oral   SpO2: 99% 91%    Weight:      Height:        Intake/Output Summary (Last 24 hours) at 01/13/2023 2156 Last data filed at 01/13/2023 1902 Gross per 24 hour  Intake 691.86 ml  Output 700 ml  Net -8.14 ml   Filed Weights   01/12/23 2130 01/13/23 0457  Weight: 87.7 kg 87.7 kg   Examination: Physical Exam:  Constitutional: WN/WD obese Caucasian female in mild respiratory distress Respiratory: Diminished to auscultation bilaterally with some coarse breath sounds and has some crackles and some slight rhonchi but no pedal wheezing or rales.  Has a normal respiratory effort and is not tachypneic or using accessory muscles to breathe Cardiovascular: RRR, no murmurs / rubs / gallops. S1 and S2 auscultated. No extremity edema Abdomen: Soft, non-tender, distended secondary to body habitus. Bowel sounds positive.  GU: Deferred. Musculoskeletal: No clubbing / cyanosis of digits/nails. No joint deformity upper and lower extremities.  Skin: No rashes, lesions, ulcers on limited skin evaluation. No induration; Warm and dry.   Neurologic: CN 2-12 grossly intact with no focal deficits. Romberg sign and cerebellar reflexes not assessed.  Psychiatric: Normal judgment and insight. Alert and oriented x 3.  A little anxious  Data Reviewed: I have personally reviewed following labs and imaging studies  CBC: Recent Labs  Lab 01/12/23 1415 01/13/23 0340  WBC 16.8* 18.3*  HGB 15.3* 12.9  HCT 49.2* 39.9  MCV 91.4 89.5  PLT 277 217   Basic Metabolic Panel: Recent Labs  Lab 01/12/23 1415 01/13/23 0340  NA 131* 128*  K 4.2 4.3  CL 94* 98  CO2 18* 19*  GLUCOSE 487* 362*  BUN 27* 38*  CREATININE 1.21* 1.14*  CALCIUM 8.9 8.2*  MG 2.2 2.2  PHOS  --  3.1   GFR: Estimated Creatinine Clearance: 43.8 mL/min (A) (by C-G formula based on SCr of 1.14 mg/dL (H)). Liver Function Tests: Recent Labs  Lab 01/13/23 0340  AST 19  ALT  16  ALKPHOS 53  BILITOT 0.5  PROT 6.6  ALBUMIN 3.3*   No results for input(s): "LIPASE", "AMYLASE" in the last 168 hours. No results for input(s): "AMMONIA" in the last 168 hours. Coagulation Profile: No results for input(s): "INR", "PROTIME" in the last 168 hours. Cardiac Enzymes: No results for input(s): "CKTOTAL", "CKMB", "CKMBINDEX", "TROPONINI" in the last 168 hours. BNP (last 3 results) No results for input(s): "PROBNP" in the last 8760 hours. HbA1C: No results for input(s): "HGBA1C" in the last 72 hours. CBG: Recent Labs  Lab 01/12/23 1354 01/12/23 2206 01/12/23 2310 01/13/23 0804 01/13/23 1144  GLUCAP 496* 479* 405* 309* 248*   Lipid Profile: Recent Labs    01/13/23 1157  CHOL 200  HDL 32*  LDLCALC 141*  TRIG 134  CHOLHDL 6.3   Thyroid Function Tests: No results for input(s): "TSH", "T4TOTAL", "FREET4", "T3FREE", "THYROIDAB" in the last 72 hours. Anemia Panel: No results for input(s): "VITAMINB12", "FOLATE", "FERRITIN", "TIBC", "IRON", "RETICCTPCT" in the last 72 hours. Sepsis Labs: Recent Labs  Lab 01/13/23 1158  PROCALCITON <0.10  LATICACIDVEN  2.8*    Recent Results (from the past 240 hour(s))  MRSA Next Gen by PCR, Nasal     Status: None   Collection Time: 01/12/23  9:43 PM   Specimen: Nasal Mucosa; Nasal Swab  Result Value Ref Range Status   MRSA by PCR Next Gen NOT DETECTED NOT DETECTED Final    Comment: (NOTE) The GeneXpert MRSA Assay (FDA approved for NASAL specimens only), is one component of a comprehensive MRSA colonization surveillance program. It is not intended to diagnose MRSA infection nor to guide or monitor treatment for MRSA infections. Test performance is not FDA approved in patients less than 81 years old. Performed at [facility], [address] [place], [place] [address]      Radiology Studies: CT CHEST WO CONTRAST  Result Date: 01/13/2023 CLINICAL DATA:  Respiratory illness with nondiagnostic x-ray. History of endometrial cancer. EXAM: CT CHEST, ABDOMEN AND PELVIS WITHOUT CONTRAST TECHNIQUE: Multidetector CT imaging of the chest, abdomen and pelvis was performed following the standard protocol without IV contrast. RADIATION DOSE REDUCTION: This exam was performed according to the departmental dose-optimization program which includes automated exposure control, adjustment of the mA and/or kV according to patient size and/or use of iterative reconstruction technique. COMPARISON:  Chest radiograph 01/12/2023 and 11/26/2022. CT chest 09/01/2022 FINDINGS: CT CHEST FINDINGS Cardiovascular: Mild cardiac enlargement. Calcification in the aortic and mitral valves. No pericardial effusions. Normal caliber thoracic aorta. Calcification of the aorta and coronary arteries. Mediastinum/Nodes: Esophagus is decompressed. Residual contrast material is demonstrated in the esophagus, possibly representing reflux or dysmotility. No significant lymphadenopathy. Thyroid gland is unremarkable. Lungs/Pleura: Patchy ground-glass infiltrates throughout both lungs in a mostly perihilar distribution. This could  represent edema, multifocal pneumonia, or air trapping. No pleural effusions. No pneumothorax. Musculoskeletal: Postoperative right shoulder arthroplasty. Old rib fractures. Degenerative changes in the spine. Old appearing endplate compression deformities at T9, T11, and T12. No destructive bone lesions. CT ABDOMEN PELVIS FINDINGS Hepatobiliary: No focal liver abnormality is seen. No gallstones, gallbladder wall thickening, or biliary dilatation. Pancreas: Unremarkable. No pancreatic ductal dilatation or surrounding inflammatory changes. Spleen: Normal in size without focal abnormality. Adrenals/Urinary Tract: No adrenal gland nodules. Mild parenchymal atrophy in the kidneys. No hydronephrosis or hydroureter. No renal, ureteral, or bladder stones. Bladder is normal. Stomach/Bowel: Stomach, small bowel, and colon are not abnormally distended. Contrast material flows through to the cecum without evidence of bowel obstruction.  No wall thickening or inflammatory changes are appreciated. There is a duodenal diverticulum. The appendix is normal. Vascular/Lymphatic: Aortic atherosclerosis. No enlarged abdominal or pelvic lymph nodes. Reproductive: Uterus and bilateral adnexa are unremarkable. Other: No abdominal wall hernia or abnormality. No abdominopelvic ascites. Musculoskeletal: Mild endplate compression of L2 appears chronic. Degenerative changes in the spine. No destructive bone lesions. IMPRESSION: 1. Perihilar ground-glass infiltrates in the lungs likely representing edema or multifocal pneumonia. 2. No evidence of metastatic disease seen in the chest, abdomen, or pelvis on noncontrast imaging. 3. Aortic atherosclerosis 4. Multiple chronic appearing endplate compression deformities in the spine likely indicate osteoporosis. Electronically Signed   By: Burman Nieves M.D.   On: 01/13/2023 17:02   CT ABDOMEN PELVIS WO CONTRAST  Result Date: 01/13/2023 CLINICAL DATA:  Respiratory illness with nondiagnostic  x-ray. History of endometrial cancer. EXAM: CT CHEST, ABDOMEN AND PELVIS WITHOUT CONTRAST TECHNIQUE: Multidetector CT imaging of the chest, abdomen and pelvis was performed following the standard protocol without IV contrast. RADIATION DOSE REDUCTION: This exam was performed according to the departmental dose-optimization program which includes automated exposure control, adjustment of the mA and/or kV according to patient size and/or use of iterative reconstruction technique. COMPARISON:  Chest radiograph 01/12/2023 and 11/26/2022. CT chest 09/01/2022 FINDINGS: CT CHEST FINDINGS Cardiovascular: Mild cardiac enlargement. Calcification in the aortic and mitral valves. No pericardial effusions. Normal caliber thoracic aorta. Calcification of the aorta and coronary arteries. Mediastinum/Nodes: Esophagus is decompressed. Residual contrast material is demonstrated in the esophagus, possibly representing reflux or dysmotility. No significant lymphadenopathy. Thyroid gland is unremarkable. Lungs/Pleura: Patchy ground-glass infiltrates throughout both lungs in a mostly perihilar distribution. This could represent edema, multifocal pneumonia, or air trapping. No pleural effusions. No pneumothorax. Musculoskeletal: Postoperative right shoulder arthroplasty. Old rib fractures. Degenerative changes in the spine. Old appearing endplate compression deformities at T9, T11, and T12. No destructive bone lesions. CT ABDOMEN PELVIS FINDINGS Hepatobiliary: No focal liver abnormality is seen. No gallstones, gallbladder wall thickening, or biliary dilatation. Pancreas: Unremarkable. No pancreatic ductal dilatation or surrounding inflammatory changes. Spleen: Normal in size without focal abnormality. Adrenals/Urinary Tract: No adrenal gland nodules. Mild parenchymal atrophy in the kidneys. No hydronephrosis or hydroureter. No renal, ureteral, or bladder stones. Bladder is normal. Stomach/Bowel: Stomach, small bowel, and colon are not  abnormally distended. Contrast material flows through to the cecum without evidence of bowel obstruction. No wall thickening or inflammatory changes are appreciated. There is a duodenal diverticulum. The appendix is normal. Vascular/Lymphatic: Aortic atherosclerosis. No enlarged abdominal or pelvic lymph nodes. Reproductive: Uterus and bilateral adnexa are unremarkable. Other: No abdominal wall hernia or abnormality. No abdominopelvic ascites. Musculoskeletal: Mild endplate compression of L2 appears chronic. Degenerative changes in the spine. No destructive bone lesions. IMPRESSION: 1. Perihilar ground-glass infiltrates in the lungs likely representing edema or multifocal pneumonia. 2. No evidence of metastatic disease seen in the chest, abdomen, or pelvis on noncontrast imaging. 3. Aortic atherosclerosis 4. Multiple chronic appearing endplate compression deformities in the spine likely indicate osteoporosis. Electronically Signed   By: Burman Nieves M.D.   On: 01/13/2023 17:02   DG Chest Port 1 View  Result Date: 01/12/2023 CLINICAL DATA:  shob EXAM: PORTABLE CHEST 1 VIEW COMPARISON:  CXR 11/26/22 FINDINGS: Pleural effusion. No pneumothorax. Unchanged cardiac and mediastinal contours. Compared to prior exam there are new hazy opacities in the bilateral mid lung fields, which could represent pulmonary edema or multifocal infection. No radiographically apparent displaced rib fractures. Visualized upper abdomen unremarkable. Right shoulder arthroplasty. IMPRESSION: Compared to  prior exam there are new hazy opacities in the bilateral mid lung fields, which could represent pulmonary edema or multifocal infection. Electronically Signed   By: Lorenza Cambridge M.D.   On: 01/12/2023 14:36    Scheduled Meds:  allopurinol  100 mg Oral Daily   ARIPiprazole  2 mg Oral QHS   aspirin EC  81 mg Oral Daily   atorvastatin  40 mg Oral Daily   Chlorhexidine Gluconate Cloth  6 each Topical QHS   cholecalciferol  1,000 Units  Oral Q1500   cyanocobalamin  1,000 mcg Oral Daily   doxepin  10 mg Oral QHS   escitalopram  10 mg Oral Daily   furosemide  40 mg Intravenous Daily   insulin aspart  0-20 Units Subcutaneous TID PC & HS   insulin glargine-yfgn  20 Units Subcutaneous BID   LORazepam  0.5 mg Oral TID   metoprolol tartrate  50 mg Oral BID   morphine  15 mg Oral Q12H   norethindrone  10 mg Oral Daily   mouth rinse  15 mL Mouth Rinse 4 times per day   OXcarbazepine  150 mg Oral BID   pregabalin  200 mg Oral QHS   Continuous Infusions:  heparin 1,400 Units/hr (01/13/23 1755)    LOS: 1 day   Marguerita Merles, DO [facility] Available via Epic secure chat 7am-7pm After these hours, please refer to coverage provider listed on [email or web address] 01/13/2023, 9:56 PM

## 2023-01-13 NOTE — Consult Note (Signed)
Cardiology Consultation   Patient ID: Amber Stephenson MRN: [id number]; DOB: Sep 16, 1945  Admit date: 01/12/2023 Date of Consult: 01/13/2023  PCP:  Mechele Claude, MD   [facility] Providers Cardiologist:  Armanda Magic, MD   {   Patient Profile:   Amber Stephenson is a 77 y.o. female with a hx of permanent A fib, non-obstructive CAD, HFpEF, HTN, HLD, chronic pain, OSA not tolerant CPAP, type 2 DM, endometrial cancer, CKD III, frequent falls, polypharmacy, bipolar disorder, PTSD,  who is being seen 01/13/2023 for the evaluation of A fib RVR and CHF  at the request of Dr Marland Mcalpine.  History of Present Illness:   [name] with above PMH presented to [facility] hospital for CT scan as part of her endometrial cancer workup. While waiting for CT imaging, she become dyspneic. She states she felt significant air hunger suddenly. RRT was called and she was hypoxic with pox 72% on 4L [place] and was placed on NRB mask. She was sent to ER for evaluation. Reportedly she is  anaphylactic  to contrast  but this was not given yet. She was observed to be diaphoretic, tachycardiac 150s, tachypneic, and hypertensive SBP190s at ED, she was hypoxic on NRB and ultimately placed on BIPAP support, due to A fib RVR she was started diltiazem gtt, she was also given IV Lasix 40mg  at ED.   She states she has some baseline SOB with exertion, particularly when she is having significant amount exertional activity, otherwise, symptoms had been stable. She states her weight has been stable 188ibs on 01/11/23. She takes Lasix 20mg  daily, denied non-compliance. She was feel like her normal self before her CT scan yesterday. She denied having any chest pain or discomfort lately. She noted a new onset of cough, felt "wet". She reports some chills without fever. She states her breathing is much better now on [place] oxygen.   Lab from 8/12 showed VBG PH 7.15 >7.28. BMP with Na 131, bicarb 18, glucose 487, BUN 27, Cr 1.21, anion gap 19,  eGFR 47. Mag 2.2. BNP 446. Hs trop [phone]. CBC with WBC 16800 >18300, Hgb 15.3>12.9. CXR showed new hazy opacities in the bilateral mid lung fields, concerning for pulmonary edema versus pneumonia. EKG from 01/12/23 showed A fib RVR 144bpm, ST depression of lateral leads, ST upsloping of inferior and lateral leads, appears changed than baseline. She was admitted to ICU, started IV Lasix 40mg  daily, diltiazem gtt stopped as A fib rate controlled, started on heparin gtt. Cardiology is consulted today for further evaluation.   She follows Dr Mayford Knife, has permenant A fib, on Eliquis. 2019 cath showed non-obstructive CAD with 40% pro LAD and 30% mid LAD, 30% ost-1st Mrg. Holter monitor from 2017 showed 100% A fib burden, rare PVCs, 2.2 second pause in early AM hours. Most recent Echo was done 11/10/22 showed LVEF 65-70%, no RWMA, mild LVH, indeterminate diastolic parameter, normal RV, severe LAE, moderate RAE, trivial MR, moderate to severe MAC, aortic sclerosis.  He was last seen by Dr. Mayford Knife in the office 09/09/22, had complained some right sided pleuritic chest pain that is reproducible with palpation and movement.  This was felt due to musculoskeletal etiology.  She was felt stable from CAD and CHF standpoint.  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   Benign paroxysmal positional vertigo due to bilateral vestibular disorder 05/20/2019   Bipolar 1 disorder (HCC) 01/23/2015   with GAD, benzo  dependence   BPPV (benign paroxysmal positional vertigo) 05/20/2019   CAD (coronary artery disease)    Nonobstructive; Managed by Dr. Purvis Sheffield   Cardiomegaly 01/12/2018   CHF (congestive heart failure) 06/08/2022   Chronic back pain 01/04/2015   Chronic constipation 04/25/2020   Chronic diastolic heart failure (HCC) 05/30/2015   Chronic pain syndrome 08/22/2019   back pain, sacroiliitis   Chronic pain syndrome 08/22/2019   Chronic post-traumatic stress disorder (PTSD) 12/06/2020   Diabetic neuropathy (HCC) 02/06/2016   Dyslipidemia 09/24/2020   Dyspnea on exertion    Essential hypertension    Family history of coronary arteriosclerosis 05/30/2015   Functional diarrhea 10/26/2020   Head trauma 09/17/2020   Hemorrhoids 03/11/2022   Herpes genitalis in women 07/16/2015   History of adenomatous polyp of colon 05/21/2019   Overview:   03/31/17: Colonoscopy: nonadvanced adenoma, microscopic colitis, f/u 5 yrs, [facility]/GAP   Insomnia 01/23/2015   Lipoma 02/08/2015   Migraine headache with aura 02/12/2016   Myofascial pain dysfunction syndrome 08/22/2019   Myofascial pain dysfunction syndrome 08/22/2019   Non-alcoholic fatty liver disease 01/12/2018   Opioid dependence (HCC) 12/06/2020   OSA (obstructive sleep apnea) 02/24/2019   10/09/2018 - HST  - AHI 40.6    Osteopenia 12/06/2020   Rx alendronate 35 mg.   Pinguecula 12/06/2020   Postcoital bleeding 12/06/2020   Presbyopia 12/06/2020   Pulmonary hypertension    RLS (restless legs syndrome) 04/27/2015   Superficial fungus infection of skin 09/03/2021   Tremor, essential 12/11/2021   Type II diabetes mellitus (HCC) 09/24/2020    Past Surgical History:  Procedure Laterality Date   BREAST REDUCTION SURGERY     EYE SURGERY Right    cateracts   HAMMER TOE SURGERY     LEFT HEART CATH AND CORONARY ANGIOGRAPHY N/A 02/03/2018   Procedure: LEFT HEART CATH AND CORONARY ANGIOGRAPHY;  Surgeon: [name], Peter M, MD;  Location: [facility] INVASIVE  CV LAB;  Service: Cardiovascular;  Laterality: N/A;   REVERSE SHOULDER ARTHROPLASTY Right 08/19/2019   Procedure: REVERSE SHOULDER ARTHROPLASTY;  Surgeon: Beverely Low, MD;  Location: [facility] ORS;  Service: Orthopedics;  Laterality: Right;  interscalene block   SHOULDER SURGERY Right    "I BROKE MY SHOUDLER   THIGH SURGERY     "TO REMOVE A TUMOR "     Home Medications:  Prior to Admission medications   Medication Sig Start Date End Date Taking? Authorizing Provider  Acetaminophen (TYLENOL PO) Take 2 tablets by mouth as needed (pain/headache).   Yes [provider]  albuterol (VENTOLIN HFA) 108 (90 Base) MCG/ACT inhaler Inhale 2 puffs into the lungs every 6 (six) hours as needed for wheezing or shortness of breath. 08/09/20  Yes Sharee Holster, NP  allopurinol (ZYLOPRIM) 100 MG tablet Take 100 mg by mouth daily. 10/22/22  Yes [provider]  apixaban (ELIQUIS) 5 MG TABS tablet Take 1 tablet (5 mg total) by mouth 2 (two) times daily. 09/09/22  Yes Turner, Cornelious Bryant, MD  ARIPiprazole (ABILIFY) 2 MG tablet Take 2 mg by mouth at  bedtime. 10/07/21  Yes [provider]  BELSOMRA 20 MG TABS Take 1 tablet by mouth at bedtime as needed.   Yes [provider]  cyanocobalamin (VITAMIN B12) 1000 MCG tablet Take 1 tablet (1,000 mcg total) by mouth daily. 09/04/22  Yes Ghimire, Werner Lean, MD  denosumab (PROLIA) 60 MG/ML SOSY injection Inject 60 mg into the skin every 6 (six) months. 08/14/20  Yes Stacks, Broadus John, MD  doxepin (SINEQUAN) 10 MG capsule Take 10 mg by mouth at bedtime. 11/27/22  Yes [provider]  escitalopram (LEXAPRO) 10 MG tablet Take 10 mg by mouth daily.   Yes [provider]  fluticasone-salmeterol (ADVAIR DISKUS) 100-50 MCG/ACT AEPB Inhale 1 puff into the lungs 2 (two) times daily. 09/11/22  Yes Parrett, Tammy S, NP  Galcanezumab-gnlm (EMGALITY) 120 MG/ML SOAJ INJECT ONCE A MONTH Patient taking differently: Inject 120 mg into the skin See admin  instructions. INJECT ONCE A MONTH 10/31/22  Yes Marcos Eke, PA-C  hydrochlorothiazide (MICROZIDE) 12.5 MG capsule Take 1 capsule (12.5 mg total) by mouth daily. 12/08/22  Yes Stacks, Broadus John, MD  insulin glargine (LANTUS SOLOSTAR) 100 UNIT/ML Solostar Pen Inject 35 Units into the skin 2 (two) times daily. 12/25/22  Yes Stacks, Broadus John, MD  insulin lispro (HUMALOG KWIKPEN) 100 UNIT/ML KwikPen Max daily 50 units Patient taking differently: Inject 4-8 Units into the skin 3 (three) times daily. 05/28/22  Yes Shamleffer, Konrad Dolores, MD  lisinopril (ZESTRIL) 10 MG tablet Take 1 tablet (10 mg total) by mouth daily. 12/08/22  Yes Stacks, Broadus John, MD  LORazepam (ATIVAN) 1 MG tablet Take 0.5 tablets (0.5 mg total) by mouth 2 (two) times daily. 09/04/22  Yes Ghimire, Werner Lean, MD  metFORMIN (GLUCOPHAGE-XR) 500 MG 24 hr tablet Take 2 tablets (1,000 mg total) by mouth daily with breakfast. 12/08/22  Yes Shamleffer, Konrad Dolores, MD  metoprolol tartrate (LOPRESSOR) 50 MG tablet Take 1 tablet (50 mg total) by mouth 2 (two) times daily. 01/06/23  Yes Turner, Cornelious Bryant, MD  morphine (MS CONTIN) 15 MG 12 hr tablet Take 1 tablet (15 mg total) by mouth every 12 (twelve) hours. For chronic pain- can refill by 01/13/23 12/22/22  Yes Lovorn, Aundra Millet, MD  naloxone [facility]) nasal spray 4 mg/0.1 mL Place 1 spray into the nose once. 08/08/21  Yes [provider]  nystatin (MYCOSTATIN/NYSTOP) powder Apply 1 Application topically as needed (rash). 06/16/22  Yes Shamleffer, Konrad Dolores, MD  nystatin ointment (MYCOSTATIN) Apply 1 Application topically 2 (two) times daily. Patient taking differently: Apply 1 Application topically as needed (rash). 12/27/21  Yes Adline Potter, NP  ondansetron (ZOFRAN-ODT) 8 MG disintegrating tablet Take 1 tablet (8 mg total) by mouth every 6 (six) hours as needed for nausea or vomiting. 01/13/22  Yes Mechele Claude, MD  OXcarbazepine (TRILEPTAL) 150 MG tablet Take 1 tablet (150 mg total) by  mouth 2 (two) times daily. 06/13/22  Yes Sheikh, Omair Latif, DO  predniSONE (DELTASONE) 50 MG tablet Take one tablet at 13, 7 and 1 hour prior to CT scan. 01/05/23  Yes [name], MD  pregabalin (LYRICA) 200 MG capsule Take 200 mg by mouth daily.   Yes [provider]  senna-docusate (SENOKOT-S) 8.6-50 MG tablet Take 1 tablet by mouth at bedtime. Patient taking differently: Take 1 tablet by mouth as needed for moderate constipation. 06/13/22  Yes Sheikh, Omair Latif, DO  tirzepatide [facility]) 5 MG/0.5ML Pen Inject 5 mg into the skin once a week. 11/11/22  Yes Shamleffer, Konrad Dolores,  MD  Vitamin D, Cholecalciferol, 25 MCG (1000 UT) TABS Take 1,000 Units by mouth daily in the afternoon. 07/26/20  Yes [provider]  Continuous Blood Gluc Sensor (DEXCOM G7 SENSOR) MISC Change sensor every 10 days Patient taking differently: 1 each by Other route See admin instructions. Change sensor every 10 days 05/20/22   Shamleffer, Konrad Dolores, MD  diphenhydrAMINE (BENADRYL) 50 MG tablet Take 1 tablet (50 mg total) by mouth once for 1 dose. Take 1 hour prior to CT scan 01/05/23 01/05/23  [name], MD  Insulin Pen Needle 29G X MISC 1 Device by Does not apply route daily in the afternoon. 05/28/22   Shamleffer, Konrad Dolores, MD  norethindrone (AYGESTIN) 5 MG tablet Take 2 tablets (10 mg total) by mouth daily. 12/30/22 01/29/23  [name], DO  pregabalin (LYRICA) 50 MG capsule Take 1 capsule (50 mg total) by mouth at bedtime. Patient not taking: Reported on 01/12/2023 12/25/22   Mechele Claude, MD    Inpatient Medications: Scheduled Meds:  allopurinol  100 mg Oral Daily   ARIPiprazole  2 mg Oral QHS   Chlorhexidine Gluconate Cloth  6 each Topical QHS   cholecalciferol  1,000 Units Oral Q1500   cyanocobalamin  1,000 mcg Oral Daily   doxepin  10 mg Oral QHS   escitalopram  10 mg Oral Daily   furosemide  40 mg Intravenous Daily   insulin aspart  0-20 Units Subcutaneous  TID PC & HS   insulin glargine-yfgn  20 Units Subcutaneous BID   LORazepam  0.5 mg Oral TID   metoprolol tartrate  50 mg Oral BID   morphine  15 mg Oral Q12H   norethindrone  10 mg Oral Daily   mouth rinse  15 mL Mouth Rinse 4 times per day   OXcarbazepine  150 mg Oral BID   pregabalin  200 mg Oral QHS   Continuous Infusions:  heparin 1,200 Units/hr (01/13/23 0913)   PRN Meds: acetaminophen, mouth rinse, zolpidem  Allergies:    Allergies  Allergen Reactions   Iodine Anaphylaxis   Ivp Dye [Iodinated Contrast Media] Anaphylaxis and Swelling    Throat closes    Latex Other (See Comments)    Latex tape pulls skin with it   Tizanidine Other (See Comments)    Weakness, goofy, bad dreams    Social History:   Social History   Socioeconomic History   Marital status: Married    Spouse name: Dwight   Number of children: 3   Years of education: 15   Highest education level: Associate degree: academic program  Occupational History   Occupation: Retired    Comment: Chief Financial Officer  Tobacco Use   Smoking status: Never   Smokeless tobacco: Never  Vaping Use   Vaping status: Never Used  Substance and Sexual Activity   Alcohol use: Not Currently    Alcohol/week: 0.0 standard drinks of alcohol   Drug use: No   Sexual activity: Yes    Partners: Male    Birth control/protection: Post-menopausal  Other Topics Concern   Not on file  Social History Narrative   Lives at home with husband.    They have 3 children who live away - [place], [place], and [place].   She is from [place] and most of her family lives there.   Her husbands's family live nearby   Right handed.   Social Determinants of Health   Financial Resource Strain: Low Risk  (06/30/2022)   Overall Financial Resource Strain (CARDIA)  Difficulty of Paying Living Expenses: Not hard at all  Food Insecurity: No Food Insecurity (11/13/2022)   Hunger Vital Sign    Worried About Running Out of Food in the Last Year:  Never true    Ran Out of Food in the Last Year: Never true  Transportation Needs: No Transportation Needs (11/13/2022)   PRAPARE - Administrator, Civil Service (Medical): No    Lack of Transportation (Non-Medical): No  Physical Activity: Inactive (12/13/2021)   Exercise Vital Sign    Days of Exercise per Week: 0 days    Minutes of Exercise per Session: 0 min  Stress: No Stress Concern Present (12/13/2021)   [employer] of Occupational Health - Occupational Stress Questionnaire    Feeling of Stress : Not at all  Social Connections: Socially Integrated (12/13/2021)   Social Connection and Isolation Panel [NHANES]    Frequency of Communication with Friends and Family: More than three times a week    Frequency of Social Gatherings with Friends and Family: Twice a week    Attends Religious Services: More than 4 times per year    Active Member of [employer] or Organizations: Yes    Attends Engineer, structural: More than 4 times per year    Marital Status: Married  Catering manager Violence: Patient Declined (11/09/2022)   Humiliation, Afraid, Rape, and Kick questionnaire    Fear of Current or Ex-Partner: Patient declined    Emotionally Abused: Patient declined    Physically Abused: Patient declined    Sexually Abused: Patient declined    Family History:    Family History  Problem Relation Age of Onset   Diabetes Mother    Heart disease Mother    Alzheimer's disease Father    Heart disease Sister        CABG   Diabetes Sister    Alcohol abuse Sister    Stroke Brother    Heart disease Brother    Mental illness Brother    Diabetes Brother    Breast cancer Neg Hx    Ovarian cancer Neg Hx    Colon cancer Neg Hx    Endometrial cancer Neg Hx      ROS:  Constitutional: chills Eyes: Denied vision change or loss Ears/Nose/Mouth/Throat: cough Cardiovascular: denied chest pain/pressure Respiratory: see HPI  Gastrointestinal: Denied nausea, vomiting, abdominal  pain, diarrhea Genital/Urinary: Denied dysuria, hematuria, urinary frequency/urgency Musculoskeletal: Denied muscle ache, joint pain, weakness Skin: Denied rash, wound Neuro: dizziness with position change  Psych: Denied history of depression/anxiety  Endocrine: history of diabetes   Physical Exam/Data:   Vitals:   01/13/23 0815 01/13/23 0900 01/13/23 1000 01/13/23 1100  BP: 120/67 133/78 (!) 140/92 102/86  Pulse: 100 97 94 83  Resp: (!) 24 (!) 22 (!) 25 17  Temp:      TempSrc:      SpO2: 97% 95% 95% 98%  Weight:      Height:        Intake/Output Summary (Last 24 hours) at 01/13/2023 1212 Last data filed at 01/13/2023 0913 Gross per 24 hour  Intake 575.63 ml  Output --  Net 575.63 ml      01/13/2023    4:57 AM 01/12/2023    9:30 PM 01/07/2023    9:15 AM  Last 3 Weights  Weight (lbs) 193 lb 5.5 oz 193 lb 5.5 oz 191 lb  Weight (kg) 87.7 kg 87.7 kg 86.637 kg     Body mass index is 35.36 kg/m.  Vitals:  Vitals:   01/13/23 1000 01/13/23 1100  BP: (!) 140/92 102/86  Pulse: 94 83  Resp: (!) 25 17  Temp:    SpO2: 95% 98%   General Appearance: In no apparent distress, laying in bed, well nourished  HEENT: Normocephalic, atraumatic.  Neck: Supple, trachea midline, JVD elevated upper neck  Cardiovascular: Irregularly irregular, S1-S2, no murmur  respiratory: Mild dyspnea with conversation, lung sounds with bilateral crackles at bases, on [place] oxygen Gastrointestinal: Bowel sounds positive, abdomen soft, non-tender Extremities: Able to move all extremities in bed without difficulty, no edema of BLE Musculoskeletal: Normal muscle bulk and tone Skin: Warm, well-perfused Neurologic: Alert, oriented to person, place and time. Fluent speech, no cognitive deficit,  no gross focal neuro deficit Psychiatric: Normal affect. Mood is appropriate.     EKG:  The EKG was personally reviewed and demonstrates:     EKG from 01/12/23 showed A fib RVR 144bpm, ST depression of lateral  leads, ST upsloping of inferior and lateral leads, appears changed than baseline.  Telemetry:  Telemetry was personally reviewed and demonstrates:    Atrial fibrillation with ventricular rate of 80s, occasional PVC  Relevant CV Studies:   Echo from 11/10/22:   1. Left ventricular ejection fraction, by estimation, is 65 to 70%. The  left ventricle has normal function. The left ventricle has no regional  wall motion abnormalities. There is mild concentric left ventricular  hypertrophy. Left ventricular diastolic  parameters are indeterminate.   2. Right ventricular systolic function is normal. The right ventricular  size is normal.   3. Left atrial size was severely dilated.   4. Right atrial size was moderately dilated.   5. The mitral valve is degenerative. Trivial mitral valve regurgitation.  Moderate to severe mitral annular calcification.   6. The aortic valve was not well visualized. Aortic valve regurgitation  is mild. Aortic valve sclerosis/calcification is present, without any  evidence of aortic stenosis.   7. The inferior vena cava is dilated in size with <50% respiratory  variability, suggesting right atrial pressure of 15 mmHg.   Comparison(s): No significant change from prior study.    Laboratory Data:  High Sensitivity Troponin:   Recent Labs  Lab 01/12/23 1415 01/12/23 1604 01/12/23 2156 01/13/23 0034  TROPONINIHS 56* 377* 852* 838*     Chemistry Recent Labs  Lab 01/12/23 1415 01/13/23 0340  NA 131* 128*  K 4.2 4.3  CL 94* 98  CO2 18* 19*  GLUCOSE 487* 362*  BUN 27* 38*  CREATININE 1.21* 1.14*  CALCIUM 8.9 8.2*  MG 2.2 2.2  GFRNONAA 47* 50*  ANIONGAP 19* 11    Recent Labs  Lab 01/13/23 0340  PROT 6.6  ALBUMIN 3.3*  AST 19  ALT 16  ALKPHOS 53  BILITOT 0.5   Lipids No results for input(s): "CHOL", "TRIG", "HDL", "LABVLDL", "LDLCALC", "CHOLHDL" in the last 168 hours.  Hematology Recent Labs  Lab 01/12/23 1415 01/13/23 0340  WBC  16.8* 18.3*  RBC 5.38* 4.46  HGB 15.3* 12.9  HCT 49.2* 39.9  MCV 91.4 89.5  MCH 28.4 28.9  MCHC 31.1 32.3  RDW 15.2 15.2  PLT 277 217   Thyroid No results for input(s): "TSH", "FREET4" in the last 168 hours.  BNP Recent Labs  Lab 01/12/23 1415  BNP 446.1*    DDimer No results for input(s): "DDIMER" in the last 168 hours.   Radiology/Studies:  [facility] Chest Port 1 View  Result Date: 01/12/2023 CLINICAL DATA:  shob EXAM:  PORTABLE CHEST 1 VIEW COMPARISON:  CXR 11/26/22 FINDINGS: Pleural effusion. No pneumothorax. Unchanged cardiac and mediastinal contours. Compared to prior exam there are new hazy opacities in the bilateral mid lung fields, which could represent pulmonary edema or multifocal infection. No radiographically apparent displaced rib fractures. Visualized upper abdomen unremarkable. Right shoulder arthroplasty. IMPRESSION: Compared to prior exam there are new hazy opacities in the bilateral mid lung fields, which could represent pulmonary edema or multifocal infection. Electronically Signed   By: Lorenza Cambridge M.D.   On: 01/12/2023 14:36     Assessment and Plan:   NSTEMI Nonobstructive CAD -2019 cath showed nonobstructive CAD -Presented for elective CT, developed sudden onset of resting dyspnea, was in A fib RVR and HTN urgency, no chest pain -Labs so far revealed DKA, AKI, leukocytosis (POA - Hs trop 56 >377 > 852 >835; BNP 446, POA - EKG from 8/12 revealed abnormal ST changes of the inferolateral leads - possible demand ischemia from HTN urgency /CHF decompensation/pneumonia versus true ischemic event  - Will update Echo, if EF down, will need further cardiac cath - Will check lactic acid, procalcitonin, and CT chest W/O given abnormal CXR as well as hypoxia/cough/elukocytosis suggestive infectious process, consider empiric antibiotic  - Ok to continue heparin gtt, continue PTA metoprolol, not on statin historically, check lipid panel and A1C   Acute on chronic diastolic  heart failure - CHF symptoms had been stable at home, developed sudden onset dyspnea at CT yesterday, possible flash pulmonary edema in the setting of HTN urgency +/- tachycardia mediated CM  - clinically with crackles, rule out sepsis/pneumonia as above, OK to continue IV Lasix 40mg  daily for now  - please track daily weight and I&O - will update Echo   Permanent Atrial Fibrillation  - rate controlled now, off diltiazem gtt - rule out sepsis /pneumonia as above  - continue PTA metoprolol, Eliquis held currently on heparin gtt, further recommendation pending work up   Acute hypoxic respiratory failure  Uncontrolled type 2 DM with hyperglycemia (gap closed) Leukocytosis  AKI  Endometrial cancer - per primary team    Risk Assessment/Risk Scores:    TIMI Risk Score for Unstable Angina or Non-ST Elevation MI:   The patient's TIMI risk score is 4, which indicates a 20% risk of all cause mortality, new or recurrent myocardial infarction or need for urgent revascularization in the next 14 days.{  New York Heart Association (NYHA) Functional Class NYHA Class III  CHA2DS2-VASc Score = 7   This indicates a 11.2% annual risk of stroke. The patient's score is based upon: CHF History: 1 HTN History: 1 Diabetes History: 1 Stroke History: 0 Vascular Disease History: 1 Age Score: 2 Gender Score: 1        For questions or updates, please contact [facility] Please consult [email or web address] for contact info under    Signed, Cyndi Bender, NP  01/13/2023 12:12 PM

## 2023-01-13 NOTE — Progress Notes (Signed)
ANTICOAGULATION CONSULT NOTE - Initial Consult  Pharmacy Consult for heparin Indication: chest pain/ACS, hx afib (on apixaban)  Allergies  Allergen Reactions   Iodine Anaphylaxis   Ivp Dye [Iodinated Contrast Media] Anaphylaxis and Swelling    Throat closes    Latex Other (See Comments)    Latex tape pulls skin with it   Tizanidine Other (See Comments)    Weakness, goofy, bad dreams    Patient Measurements: Height: 5\' 2"  (157.5 cm) Weight: 87.7 kg (193 lb 5.5 oz) IBW/kg (Calculated) : 50.1 Heparin Dosing Weight: 70 kg  Vital Signs: Temp: 97.4 F (36.3 C) (08/13 0400) Temp Source: Axillary (08/13 0400) BP: 100/62 (08/13 0454) Pulse Rate: 78 (08/13 0455)  Labs: Recent Labs    01/12/23 1415 01/12/23 1604 01/12/23 1832 01/12/23 2156 01/13/23 0034 01/13/23 0340  HGB 15.3*  --   --   --   --  12.9  HCT 49.2*  --   --   --   --  39.9  PLT 277  --   --   --   --  217  APTT  --   --  24  --   --  31  HEPARINUNFRC  --   --  >1.10*  --   --  >1.10*  CREATININE 1.21*  --   --   --   --  1.14*  TROPONINIHS 56* 377*  --  852* 838*  --     Estimated Creatinine Clearance: 43.8 mL/min (A) (by C-G formula based on SCr of 1.14 mg/dL (H)).   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   Benign paroxysmal positional vertigo due to bilateral vestibular disorder 05/20/2019   Bipolar 1 disorder (HCC) 01/23/2015   with GAD, benzo dependence   BPPV (benign paroxysmal positional vertigo) 05/20/2019   CAD (coronary artery disease)    Nonobstructive; Managed by Dr. Purvis Sheffield   Cardiomegaly 01/12/2018   CHF (congestive  heart failure) 06/08/2022   Chronic back pain 01/04/2015   Chronic constipation 04/25/2020   Chronic diastolic heart failure (HCC) 05/30/2015   Chronic pain syndrome 08/22/2019   back pain, sacroiliitis   Chronic pain syndrome 08/22/2019   Chronic post-traumatic stress disorder (PTSD) 12/06/2020   Diabetic neuropathy (HCC) 02/06/2016   Dyslipidemia 09/24/2020   Dyspnea on exertion    Essential hypertension    Family history of coronary arteriosclerosis 05/30/2015   Functional diarrhea 10/26/2020   Head trauma 09/17/2020   Hemorrhoids 03/11/2022   Herpes genitalis in women 07/16/2015   History of adenomatous polyp of colon 05/21/2019   Overview:   03/31/17: Colonoscopy: nonadvanced adenoma, microscopic colitis, f/u 5 yrs, [facility]/GAP   Insomnia 01/23/2015   Lipoma 02/08/2015   Migraine headache with aura 02/12/2016   Myofascial pain dysfunction syndrome 08/22/2019   Myofascial pain dysfunction syndrome 08/22/2019   Non-alcoholic fatty liver disease 01/12/2018   Opioid dependence (HCC) 12/06/2020   OSA (obstructive sleep apnea) 02/24/2019   10/09/2018 - HST  - AHI 40.6    Osteopenia 12/06/2020   Rx alendronate 35 mg.   Pinguecula 12/06/2020   Postcoital bleeding 12/06/2020   Presbyopia 12/06/2020   Pulmonary hypertension    RLS (  restless legs syndrome) 04/27/2015   Superficial fungus infection of skin 09/03/2021   Tremor, essential 12/11/2021   Type II diabetes mellitus (HCC) 09/24/2020    Medications: Apixaban 5 mg PO BID PTA for afib Last dose: 8/12 between 08:00 - 09:00.  Assessment: Pt is a 64 yoF chronically anticoagulated with apixaban for history of atrial fibrillation. Pt presents with tachycardia, respiratory distress > placed on Bipap. Pharmacy consulted to dose heparin for ACS/afib.  Today, 01/13/23 Heparin level > 1.1 aPTT = 31 sec (subtherapeutic) with heparin gtt @ 1000 units/hr CBC: Hgb dec to 12.9 (15.3 on admission), Plt 217   Heparin level continues to  be falsely elevated due to [facility] RN confirms no interruption in therapy and no line issues.  No reports of bleeding complications.  Goal of Therapy:  Heparin level 0.3-0.7 units/ml aPTT 66-102 seconds Monitor platelets by anticoagulation protocol: Yes   Plan:  Increase heparin infusion to 1200 units/hr Check 8 hour aPTT after rate increased CBC, heparin level/aPTT daily. Once heparin level and aPTT correlate, can monitor using heparin level only Monitor for signs of bleeding Follow for ability to resume apixaban  , Joselyn Glassman, PharmD 01/13/2023,5:05 AM

## 2023-01-13 NOTE — Progress Notes (Signed)
Patient having increased WOB.  Placed back on BiPAP    01/13/23 2348  BiPAP/CPAP/SIPAP  BiPAP/CPAP/SIPAP Pt Type Adult  BiPAP/CPAP/SIPAP V60  Mask Type Full face mask  Mask Size Medium  Set Rate 16 breaths/min  Respiratory Rate 21 breaths/min  IPAP 16 cmH20  EPAP 6 cmH2O  FiO2 (%) 40 %  Minute Ventilation 10.3  Leak 2  Peak Inspiratory Pressure (PIP) 16  Tidal Volume (Vt) 527  Patient Home Equipment No  Auto Titrate No  Press High Alarm 30 cmH2O  Press Low Alarm 5 cmH2O  Oxygen Percent 40 %  BiPAP/CPAP /SiPAP Vitals  Pulse Rate 83  Resp (!) 21  SpO2 100 %  Bilateral Breath Sounds Diminished  MEWS Score/Color  MEWS Score 1  MEWS Score Color Green

## 2023-01-13 NOTE — Telephone Encounter (Signed)
Spoke with Nettie Elm at [phone] stated that the surgical optimization form has been received.  Patient has a virtual visit on 8/16 @ 1:40pm.  Will have form sent back after visit.

## 2023-01-13 NOTE — Plan of Care (Signed)
Patient is a new admit on BiPAP with no intolerance noted.  Problem: Health Behavior/Discharge Planning: Goal: Ability to manage health-related needs will improve Outcome: Progressing   Problem: Clinical Measurements: Goal: Will remain free from infection Outcome: Progressing Goal: Diagnostic test results will improve Outcome: Progressing Goal: Respiratory complications will improve Outcome: Progressing   Problem: Activity: Goal: Risk for activity intolerance will decrease Outcome: Progressing   Problem: Safety: Goal: Ability to remain free from injury will improve Outcome: Progressing

## 2023-01-13 NOTE — Progress Notes (Signed)
ANTICOAGULATION CONSULT NOTE - Follow Up Consult  Pharmacy Consult for Heparin Indication: atrial fibrillation (apixaban held)  Allergies  Allergen Reactions   Iodine Anaphylaxis   Ivp Dye [Iodinated Contrast Media] Anaphylaxis and Swelling    Throat closes    Latex Other (See Comments)    Latex tape pulls skin with it   Tizanidine Other (See Comments)    Weakness, goofy, bad dreams    Patient Measurements: Height: 5\' 2"  (157.5 cm) Weight: 87.7 kg (193 lb 5.5 oz) IBW/kg (Calculated) : 50.1 Heparin Dosing Weight: 70 kg  Vital Signs: Temp: 97.5 F (36.4 C) (08/13 1200) Temp Source: Oral (08/13 1200) BP: 73/31 (08/13 1300) Pulse Rate: 78 (08/13 1300)  Labs: Recent Labs    01/12/23 1415 01/12/23 1604 01/12/23 1832 01/12/23 2156 01/13/23 0034 01/13/23 0340 01/13/23 1157  HGB 15.3*  --   --   --   --  12.9  --   HCT 49.2*  --   --   --   --  39.9  --   PLT 277  --   --   --   --  217  --   APTT  --   --  24  --   --  31 42*  HEPARINUNFRC  --   --  >1.10*  --   --  >1.10*  --   CREATININE 1.21*  --   --   --   --  1.14*  --   TROPONINIHS 56* 377*  --  852* 838*  --   --     Estimated Creatinine Clearance: 43.8 mL/min (A) (by C-G formula based on SCr of 1.14 mg/dL (H)).   Medications:  Scheduled:   allopurinol  100 mg Oral Daily   ARIPiprazole  2 mg Oral QHS   aspirin EC  81 mg Oral Daily   atorvastatin  40 mg Oral Daily   barium  450 mL Oral Once   Chlorhexidine Gluconate Cloth  6 each Topical QHS   cholecalciferol  1,000 Units Oral Q1500   cyanocobalamin  1,000 mcg Oral Daily   doxepin  10 mg Oral QHS   escitalopram  10 mg Oral Daily   furosemide  40 mg Intravenous Daily   insulin aspart  0-20 Units Subcutaneous TID PC & HS   insulin glargine-yfgn  20 Units Subcutaneous BID   LORazepam  0.5 mg Oral TID   metoprolol tartrate  50 mg Oral BID   morphine  15 mg Oral Q12H   norethindrone  10 mg Oral Daily   mouth rinse  15 mL Mouth Rinse 4 times per day    OXcarbazepine  150 mg Oral BID   pregabalin  200 mg Oral QHS   Infusions:   heparin 1,200 Units/hr (01/13/23 1309)    Assessment: Pt is a 77 yoF chronically anticoagulated with apixaban for history of atrial fibrillation. Pt presents with tachycardia, respiratory distress > placed on Bipap. Pharmacy consulted to dose heparin for ACS/afib.  Today, 01/13/2023: aPTT 42 sec, remains sub-therapeutic on heparin 1200 units/hr Continue to use aPTT for monitoring/dosing while heparin level is falsely elevated CBC: Hgb dec to 12.9 (15.3 on admission), Plt 217   RN confirms no interruption in therapy and no line issues.  No reports of bleeding complications.   Goal of Therapy:  Heparin level 0.3-0.7 units/ml aPTT 66-102 seconds Monitor platelets by anticoagulation protocol: Yes   Plan:  Give heparin 2000 units bolus IV x 1 Increase to heparin IV infusion  at 1400 units/hr Heparin level in 8 hours after rate change Daily heparin level and CBC Follow up ability to transition back to apixaban following cardiology workup.     Lynann Beaver PharmD, BCPS [facility] main pharmacy [phone] 01/13/2023 2:03 PM

## 2023-01-13 NOTE — Telephone Encounter (Signed)
Spoke with Amber Stephenson at [phone] and she confirmed surgical optimization form has been received.  Patient needs office visit to be cleared.  Has visit set up for 9/4.  Will have form filled out and sent back after that visit.

## 2023-01-13 NOTE — Inpatient Diabetes Management (Signed)
Inpatient Diabetes Program Recommendations  AACE/ADA: New Consensus Statement on Inpatient Glycemic Control (2015)  Target Ranges:  Prepandial:   less than 140 mg/dL      Peak postprandial:   less than 180 mg/dL (1-2 hours)      Critically ill patients:  140 - 180 mg/dL   Lab Results  Component Value Date   GLUCAP 405 (H) 01/12/2023   HGBA1C 9.8 (A) 12/08/2022    Review of Glycemic Control  Diabetes history: DM2 Outpatient Diabetes medications: Lantus 35 BID, Humalog 4-8 units TID, metformin 1000 mg with breakfast, Mounjaro 5 mg weekly Current orders for Inpatient glycemic control: Novolog 0-20 units TID with meals and 0-5 HS  HgbA1C - 9.8% Needs part of home basal insulin 362 this am.  Inpatient Diabetes Program Recommendations:    Consider adding Semglee 18 units BID  Change Novolog to 0-20 units Q4H while NPO  Continue to follow glucose trends.  Thank you. Ailene Ards, RD, LDN, CDCES Inpatient Diabetes Coordinator [phone]

## 2023-01-13 NOTE — Progress Notes (Signed)
TOC will follow for possible needs.

## 2023-01-13 NOTE — Hospital Course (Addendum)
Amber Stephenson is a 77 y.o. female with a history of chronic diastolic heart failure, chronic atrial fibrillation on Eliquis, chronic respite failure with hypoxia on 2 L/min of oxygen, insulin-dependent type 2 diabetes, primary hypertension, hyperlipidemia, bipolar 1 disorder, endometrial cancer.  Patient presented secondary to development of acute respiratory distress while attempting to lay flat for CT scan and was found to have evidence of likely flash pulmonary edema from atrial fibrillation with RVR. Cardiology consulted. Patient started on diuresis and management for acute heart failure.

## 2023-01-14 ENCOUNTER — Inpatient Hospital Stay (HOSPITAL_COMMUNITY): Payer: Medicare Other

## 2023-01-14 DIAGNOSIS — I4891 Unspecified atrial fibrillation: Secondary | ICD-10-CM | POA: Diagnosis not present

## 2023-01-14 DIAGNOSIS — I10 Essential (primary) hypertension: Secondary | ICD-10-CM

## 2023-01-14 DIAGNOSIS — J810 Acute pulmonary edema: Secondary | ICD-10-CM

## 2023-01-14 DIAGNOSIS — I5033 Acute on chronic diastolic (congestive) heart failure: Secondary | ICD-10-CM | POA: Diagnosis not present

## 2023-01-14 DIAGNOSIS — I5031 Acute diastolic (congestive) heart failure: Secondary | ICD-10-CM | POA: Diagnosis not present

## 2023-01-14 DIAGNOSIS — I482 Chronic atrial fibrillation, unspecified: Secondary | ICD-10-CM | POA: Diagnosis not present

## 2023-01-14 DIAGNOSIS — J9621 Acute and chronic respiratory failure with hypoxia: Secondary | ICD-10-CM | POA: Diagnosis not present

## 2023-01-14 DIAGNOSIS — E7800 Pure hypercholesterolemia, unspecified: Secondary | ICD-10-CM

## 2023-01-14 LAB — BASIC METABOLIC PANEL
Anion gap: 10 (ref 5–15)
BUN: 32 mg/dL — ABNORMAL HIGH (ref 8–23)
CO2: 25 mmol/L (ref 22–32)
Calcium: 8.2 mg/dL — ABNORMAL LOW (ref 8.9–10.3)
Chloride: 96 mmol/L — ABNORMAL LOW (ref 98–111)
Creatinine, Ser: 0.8 mg/dL (ref 0.44–1.00)
GFR, Estimated: >60 mL/min (ref >60)
Glucose, Bld: 176 mg/dL — ABNORMAL HIGH (ref 70–99)
Potassium: 3.8 mmol/L (ref 3.5–5.1)
Sodium: 131 mmol/L — ABNORMAL LOW (ref 135–145)

## 2023-01-14 LAB — GLUCOSE, CAPILLARY
Glucose-Capillary: 147 mg/dL — ABNORMAL HIGH (ref 70–99)
Glucose-Capillary: 96 mg/dL (ref 70–99)

## 2023-01-14 LAB — APTT: aPTT: 74 seconds — ABNORMAL HIGH (ref 24–36)

## 2023-01-14 LAB — HEPARIN LEVEL (UNFRACTIONATED): Heparin Unfractionated: >1.1 [IU]/mL — ABNORMAL HIGH (ref 0.30–0.70)

## 2023-01-14 MED ORDER — APIXABAN 5 MG PO TABS
5.0000 mg | ORAL_TABLET | Freq: Two times a day (BID) | ORAL | Status: DC
  Administered 2023-01-14 – 2023-01-18 (×9): 5 mg via ORAL
  Filled 2023-01-14 (×9): qty 1

## 2023-01-14 MED ORDER — ATORVASTATIN CALCIUM 40 MG PO TABS
40.0000 mg | ORAL_TABLET | Freq: Every day | ORAL | Status: DC
  Administered 2023-01-15 – 2023-01-18 (×4): 40 mg via ORAL
  Filled 2023-01-14 (×4): qty 1

## 2023-01-14 MED ORDER — ATORVASTATIN CALCIUM 40 MG PO TABS
80.0000 mg | ORAL_TABLET | Freq: Every day | ORAL | Status: DC
  Administered 2023-01-14: 80 mg via ORAL
  Filled 2023-01-14: qty 2

## 2023-01-14 MED ORDER — HYDROXYZINE HCL 25 MG PO TABS
25.0000 mg | ORAL_TABLET | Freq: Three times a day (TID) | ORAL | Status: DC | PRN
  Administered 2023-01-14: 25 mg via ORAL
  Filled 2023-01-14: qty 1

## 2023-01-14 MED ORDER — HEPARIN BOLUS VIA INFUSION
1500.0000 [IU] | Freq: Once | INTRAVENOUS | Status: AC
  Administered 2023-01-14: 1500 [IU] via INTRAVENOUS
  Filled 2023-01-14: qty 1500

## 2023-01-14 MED ORDER — FUROSEMIDE 10 MG/ML IJ SOLN
40.0000 mg | Freq: Two times a day (BID) | INTRAMUSCULAR | Status: DC
  Administered 2023-01-14 – 2023-01-16 (×4): 40 mg via INTRAVENOUS
  Filled 2023-01-14 (×4): qty 4

## 2023-01-14 NOTE — Plan of Care (Signed)
  Problem: Skin Integrity: Goal: Risk for impaired skin integrity will decrease Outcome: Progressing   

## 2023-01-14 NOTE — Progress Notes (Addendum)
ANTICOAGULATION CONSULT NOTE - Follow Up Consult  Pharmacy Consult for apixaban Indication: atrial fibrillation   Allergies  Allergen Reactions   Iodine Anaphylaxis   Ivp Dye [Iodinated Contrast Media] Anaphylaxis and Swelling    Throat closes    Latex Other (See Comments)    Latex tape pulls skin with it   Tizanidine Other (See Comments)    Weakness, goofy, bad dreams    Patient Measurements: Height: 5\' 2"  (157.5 cm) Weight: 87.7 kg (193 lb 5.5 oz) IBW/kg (Calculated) : 50.1 Heparin Dosing Weight: 70 kg  Vital Signs: Temp: 97.9 F (36.6 C) (08/14 1137) Temp Source: Oral (08/14 1137) BP: 125/93 (08/14 1300) Pulse Rate: 32 (08/14 1300)  Labs: Recent Labs    01/12/23 1415 01/12/23 1415 01/12/23 1604 01/12/23 1832 01/12/23 2156 01/13/23 0034 01/13/23 0340 01/13/23 1157 01/13/23 2321 01/14/23 0312 01/14/23 0830  HGB 15.3*  --   --   --   --   --  12.9  --   --  12.0  --   HCT 49.2*  --   --   --   --   --  39.9  --   --  37.1  --   PLT 277  --   --   --   --   --  217  --   --  228  --   APTT  --    < >  --  24  --   --  31 42* 51*  --  74*  HEPARINUNFRC  --   --   --  >1.10*  --   --  >1.10*  --   --   --  >1.10*  CREATININE 1.21*  --   --   --   --   --  1.14*  --   --   --   --   TROPONINIHS 56*  --  377*  --  852* 838*  --   --   --   --   --    < > = values in this interval not displayed.    Estimated Creatinine Clearance: 43.8 mL/min (A) (by C-G formula based on SCr of 1.14 mg/dL (H)).   Medications:  Scheduled:   allopurinol  100 mg Oral Daily   ARIPiprazole  2 mg Oral QHS   [START ON 01/15/2023] atorvastatin  40 mg Oral Daily   Chlorhexidine Gluconate Cloth  6 each Topical QHS   cholecalciferol  1,000 Units Oral Q1500   cyanocobalamin  1,000 mcg Oral Daily   doxepin  10 mg Oral QHS   escitalopram  10 mg Oral Daily   furosemide  40 mg Intravenous BID   insulin aspart  0-20 Units Subcutaneous TID PC & HS   insulin glargine-yfgn  20 Units  Subcutaneous BID   LORazepam  0.5 mg Oral TID   metoprolol tartrate  50 mg Oral BID   morphine  15 mg Oral Q12H   norethindrone  10 mg Oral Daily   mouth rinse  15 mL Mouth Rinse 4 times per day   OXcarbazepine  150 mg Oral BID   pregabalin  200 mg Oral QHS   Infusions:     Assessment: Pt is a 77 yoF chronically anticoagulated with apixaban for history of atrial fibrillation. Pt presents with tachycardia, respiratory distress > placed on Bipap. Pharmacy consulted to dose heparin for ACS/afib. Pharmacy now consulted to transition back to apixaban.   Today, 01/14/2023: aPTT 74 sec, increased to  therapeutic on heparin 1550 units/hr Continue to use aPTT for monitoring/dosing while heparin level > 1.1 is falsely elevated CBC: Hgb dec to 12 (15.3 on admission, but baseline is 11-13 range), Plt 217   SCr 1.14, no criteria met for dose reduction.   Goal of Therapy:  Monitor platelets by anticoagulation protocol: Yes   Plan:  Discontinue heparin Resume apixaban 5 mg PO BID Pharmacy will sign off, please re-consult with further needs.     Lynann Beaver PharmD, BCPS [facility] main pharmacy [phone] 01/14/2023 1:53 PM

## 2023-01-14 NOTE — Progress Notes (Signed)
ANTICOAGULATION CONSULT NOTE - Follow Up Consult  Pharmacy Consult for Heparin Indication: atrial fibrillation (apixaban held)  Allergies  Allergen Reactions   Iodine Anaphylaxis   Ivp Dye [Iodinated Contrast Media] Anaphylaxis and Swelling    Throat closes    Latex Other (See Comments)    Latex tape pulls skin with it   Tizanidine Other (See Comments)    Weakness, goofy, bad dreams    Patient Measurements: Height: 5\' 2"  (157.5 cm) Weight: 87.7 kg (193 lb 5.5 oz) IBW/kg (Calculated) : 50.1 Heparin Dosing Weight: 70 kg  Vital Signs: Temp: 98.3 F (36.8 C) (08/13 2310) Temp Source: Oral (08/13 2310) BP: 123/73 (08/13 2300) Pulse Rate: 83 (08/13 2348)  Labs: Recent Labs    01/12/23 1415 01/12/23 1604 01/12/23 1832 01/12/23 1832 01/12/23 2156 01/13/23 0034 01/13/23 0340 01/13/23 1157 01/13/23 2321  HGB 15.3*  --   --   --   --   --  12.9  --   --   HCT 49.2*  --   --   --   --   --  39.9  --   --   PLT 277  --   --   --   --   --  217  --   --   APTT  --   --  24   < >  --   --  31 42* 51*  HEPARINUNFRC  --   --  >1.10*  --   --   --  >1.10*  --   --   CREATININE 1.21*  --   --   --   --   --  1.14*  --   --   TROPONINIHS 56* 377*  --   --  852* 838*  --   --   --    < > = values in this interval not displayed.    Estimated Creatinine Clearance: 43.8 mL/min (A) (by C-G formula based on SCr of 1.14 mg/dL (H)).   Medications:  Scheduled:   allopurinol  100 mg Oral Daily   ARIPiprazole  2 mg Oral QHS   aspirin EC  81 mg Oral Daily   atorvastatin  40 mg Oral Daily   Chlorhexidine Gluconate Cloth  6 each Topical QHS   cholecalciferol  1,000 Units Oral Q1500   cyanocobalamin  1,000 mcg Oral Daily   doxepin  10 mg Oral QHS   escitalopram  10 mg Oral Daily   furosemide  40 mg Intravenous Daily   insulin aspart  0-20 Units Subcutaneous TID PC & HS   insulin glargine-yfgn  20 Units Subcutaneous BID   LORazepam  0.5 mg Oral TID   metoprolol tartrate  50 mg Oral  BID   morphine  15 mg Oral Q12H   norethindrone  10 mg Oral Daily   mouth rinse  15 mL Mouth Rinse 4 times per day   OXcarbazepine  150 mg Oral BID   pregabalin  200 mg Oral QHS   Infusions:   heparin 1,400 Units/hr (01/13/23 2300)    Assessment: Pt is a 77 yoF chronically anticoagulated with apixaban for history of atrial fibrillation. Pt presents with tachycardia, respiratory distress > placed on Bipap. Pharmacy consulted to dose heparin for ACS/afib.  Today, 01/14/2023: aPTT 51 sec, remains sub-therapeutic on heparin 1400 units/hr Continue to use aPTT for monitoring/dosing while heparin level is falsely elevated CBC: Hgb dec to 12.9 (15.3 on admission), Plt 217   No complications of therapy  noted   Goal of Therapy:  Heparin level 0.3-0.7 units/ml aPTT 66-102 seconds Monitor platelets by anticoagulation protocol: Yes   Plan:  Give heparin 1500 units bolus IV x 1 Increase to heparin IV infusion to 1550 units/hr Heparin level in 8 hours after rate change Daily heparin level and CBC Follow up ability to transition back to apixaban following cardiology workup.     Terrilee Files, PharmD 01/14/2023 12:16 AM

## 2023-01-14 NOTE — Progress Notes (Signed)
  Echocardiogram 2D Echocardiogram has been performed.  Amber Stephenson 01/14/2023, 9:57 AM

## 2023-01-14 NOTE — Progress Notes (Signed)
Patient Name: Amber Stephenson Date of Encounter: 01/14/2023 [facility] Cardiologist: Armanda Magic, MD   Interval Summary  .    Patient reports that her breathing feels a bit better than yesterday. Denies chest pain, palpitations. Denies fever, chills, body aches today. Has a productive cough, is unsure what color sputum is. Echo was completed this AM   Vital Signs .    Vitals:   01/14/23 0300 01/14/23 0341 01/14/23 0800 01/14/23 0830  BP: 103/66  117/63   Pulse: 75 84 82   Resp: 18 19 18    Temp:    98.3 F (36.8 C)  TempSrc:    Axillary  SpO2: 99% 100% 96%   Weight:      Height:        Intake/Output Summary (Last 24 hours) at 01/14/2023 0928 Last data filed at 01/14/2023 0841 Gross per 24 hour  Intake 1077.11 ml  Output 700 ml  Net 377.11 ml      01/13/2023    4:57 AM 01/12/2023    9:30 PM 01/07/2023    9:15 AM  Last 3 Weights  Weight (lbs) 193 lb 5.5 oz 193 lb 5.5 oz 191 lb  Weight (kg) 87.7 kg 87.7 kg 86.637 kg      Telemetry/ECG    Atrial fibrillation, HR in the 70s-80s  - Personally Reviewed  Physical Exam .   GEN: No acute distress. Laying in bed with head elevated. On Bi-PAP   Neck: No JVD Cardiac: irregular rate and rhythm, no murmurs, rubs, or gallops. Radial pulses 2+ bilaterally  Respiratory: Crackles in bilateral lung bases  GI: Soft, mildly tender to palpation near the umbilicus  MS: No edema  Assessment & Plan .     Nonobstructive CAD  Elevated Troponin  - Patient previously had cardiac catheterization in 2019 that showed nonbstructive CAD  - Patient presented for elective CT- developed sudden onset of resting dypsnea. Went into afib with RVR. Found to have SBP in the 190s. No chest pain  - hsTn [phone] - Likely demand ischemia in the setting of acute hypoxemia, acute CHF, HTN urgency, afib with RVR, AKI  - Patient has not had angina at home- reassuring.  - Echocardiogram pending today. If EF normal, can consider outpatient  stress test vs PET  - Continue IV heparin  - Continue metoprolol tartate 50 mg BID  - Back on ASA 81 mg daily  - Increase lipitor to 80 mg daily   Acute on Chronic Diastolic Heart Failure  Acute hypoxemic respiratory failure  - Most recent echocardiogram from 11/2022 showed EF 65-70%, no regional wall motion abnormalities, mild LVH - Presented complaining of increasing shortness of breath. She was using her home oxygen during the day when she previously just used it at night  - CT chest yesterday showed perihilar ground-glass infiltrates in the lungs, likely representing edema or multifocal pneumonia  - Suspect patient developed flash pulmonary edema in the setting of HTN urgency and Afib with RVR. Also did complain of 3 days of chills, cough, and she has elevated WBC. Could also have PNA- internal medicine following temperature and WBC, may initiate antibiotics if needed  - Patient has been on IV lasix 40 mg daily- output 0.7 L urine yesterday. Renal function stable. Continues to required supplemental oxygen and has crackles in lung bases   - Continue IV lasix  - Echo pending today   Permanent atrial Fibrillation  - Patient initially presented in RVR with HR elevated to  the 150s. She was started on dilt gtt with improvement - Now off dilt gtt - HR well controlled per tele  - Continue lopressor 50 mg BID  - On IV heparin given elevated trops as above- home eliquis held   HLD  - Lipid panel this admission showed LDL 141  - Increased lipitor to 80 mg daily  - Needs LFTs and lipid panel in 8 weeks   Otherwise per primary  - Leukocytosis  - Type 2 DM, poorly controlled - Endometrial Cancer  - AKI  - Hypoalbuminemia  - Anxiety   For questions or updates, please contact [facility] Please consult [email or web address] for contact info under        Signed, Jonita Albee, PA-C

## 2023-01-14 NOTE — Inpatient Diabetes Management (Signed)
Inpatient Diabetes Program Recommendations  AACE/ADA: New Consensus Statement on Inpatient Glycemic Control (2015)  Target Ranges:  Prepandial:   less than 140 mg/dL      Peak postprandial:   less than 180 mg/dL (1-2 hours)      Critically ill patients:  140 - 180 mg/dL   Lab Results  Component Value Date   GLUCAP 147 (H) 01/14/2023   HGBA1C 11.3 (H) 01/13/2023    Review of Glycemic Control  Diabetes history: DM2 Outpatient Diabetes medications: Lantus 35 units BID, Humalog 6 units TID with meals, metformin 1000 mg with breakfast Current orders for Inpatient glycemic control: Semglee 20 BID, Novolog 0-20 units TID with meals and 0-5 HS  HgbA1C - 11.3%, up from 9.8% (12/08/22) Endo: Shamleffer  Inpatient Diabetes Program Recommendations:    Agree with orders. Much improved blood sugar control from yesterday.  Spoke with pt at bedside to verify home meds. Sees Endo on a regular basis, every 2-3 months per pt. Tries to eat healthy and does not drink sodas, juice. Uses Dexcom G7 to monitor blood sugars. Has hypoglycemia occasionally in mornings. Drinks 1/2 c juice, and this usually solves it. Discussed HgbA1C increase to 11.3% (average blood sugar of 295 mg/dL) Discussed blood sugar and HgbA1C goals.  No questions or concerns.  Thank you. Ailene Ards, RD, LDN, CDCES Inpatient Diabetes Coordinator [phone]

## 2023-01-14 NOTE — Progress Notes (Signed)
BiPAP not indicated at this time. Patient in no respiratory distress at this time.

## 2023-01-14 NOTE — Progress Notes (Signed)
PROGRESS NOTE    Amber Stephenson  WJX:[id number] DOB: 22-Jun-1945 DOA: 01/12/2023 PCP: Mechele Claude, MD   Brief Narrative: Amber Stephenson is a 77 y.o. female with a history of chronic diastolic Amber failure, chronic atrial fibrillation on Eliquis, chronic respite failure with hypoxia on 2 L/min of oxygen, insulin-dependent type 2 diabetes, primary hypertension, hyperlipidemia, bipolar 1 disorder, endometrial cancer.  Patient presented secondary to development of acute respiratory distress while attempting to lay flat for CT scan and was found to have evidence of likely flash pulmonary edema from atrial fibrillation with RVR. Cardiology consulted. Patient started on diuresis and management for atrial fibrillation.   Assessment and Plan:  Acute on chronic respiratory failure with hypoxia Secondary to flash pulmonary edema in relation to atrial fibrillation with RVR with development of acute on chronic Amber failure.  It is possible that it could be an component of multifocal pneumonia as seen on CT imaging.  Patient with documented hypoxia of SpO2 down to 72% on 4 L/minute of oxygen supplementation requiring BiPAP for management. -Continue BiPAP as needed for respiratory distress -Wean to home 2 L/min of oxygen as able  Acute on chronic diastolic Amber failure In setting of atrial fibrillation with rapid ventricular rate.  Echocardiogram this admission was significant for an LVEF of 60 to 65% with indeterminant diastolic function.  Troponin elevation with a peak of 852.  Cardiology consulted and patient was started on Lasix IV diuresis.  Urine output of only 700 mL over the last 24 hours however there were 3 unmeasured urine current status.  Weight of 87.7 kg on admission and no weight available today. -Cardiology recommendations: Lasix IV -Daily weights, strict ins and out  Demand ischemia Peak troponin of 852.  Per cardiology likely demand ischemia in setting of atrial fibrillation  with RVR in addition to acute Amber failure.  Recommendation for outpatient stress test versus PET.  Recommendation to continue heparin IV for now.  Permanent atrial fibrillation with RVR Patient is managed on metoprolol 50 mg twice daily and Eliquis as an outpatient.  Patient presented with RVR with with Amber rates into the 150s.  She was started on diltiazem drip with resultant hypotension.  Diltiazem discontinued with continued stable Amber rates. -Cardiology recommendations: Continue metoprolol, continue heparin IV  Hypotension Secondary to medication effect of diltiazem infusion in addition to Lasix IV diuresis.  Diltiazem IV infusion discontinued with resolution of hypotension.  AKI Baseline creatinine is about 0.8-0.9.  Creatinine of 1.21 on admission likely related to fluid overload.  Creatinine down to 1.14 with IV diuresis.  No BMP available at this morning. -Daily BMP while on IV Lasix  Dependent diabetes mellitus type 2 Uncontrolled with hemoglobin A1c of 11.3%.  Patient is on insulin glargine 35 units twice daily in addition to insulin lispro 3 times daily via sliding scale, metformin. -Continue insulin glargine 20 units twice daily and sliding scale insulin -Hold metoprolol  High anion gap metabolic acidosis Likely related to lactic acidosis and possibly contributed to by acute kidney injury.  Anion gap closed and acidosis has improved slightly.  Chronic pain -Continue MS Contin 15 mg twice daily  Hyponatremia Appears to be a component of pseudohyponatremia secondary to hyperglycemia.  Corrected sodium of 132 from BMP yesterday. -Follow-up BMP  Hypoalbuminemia Mild.  Nonobstructive CAD Noted.  Patient with elevated troponin this admission as mentioned above, likely related to demand ischemia. -Continue aspirin and Lipitor  Hyperlipidemia -Continue Lipitor  Anxiety -Continue Ativan 0.5 mg 3 times  daily -Add hydroxyzine 25 mg as needed  Obesity Estimated body  mass index is 35.36 kg/m as calculated from the following:   Height as of this encounter: 5\' 2"  (1.575 m).   Weight as of this encounter: 87.7 kg.   DVT prophylaxis: Heparin IV Code Status:   Code Status: Full Code Family Communication: None at bedside Disposition Plan: Transfer out of ICU/stepdown once not requiring BiPAP.  Discharge home pending ability to remain stable off of BiPAP in addition to completing IV Lasix diuresis.   Consultants:  Cardiology  Procedures:  8/14: Transthoracic Echocardiogram  Antimicrobials: None    Subjective: Patient with no chest pain or dyspnea but with labored breathing. Better than admission while on BiPAP. Hoping to go home today.  Objective: BP 117/63 (BP Location: Left Arm)   Pulse 82   Temp 98.3 F (36.8 C) (Axillary)   Resp 18   Ht 5\' 2"  (1.575 m)   Wt 87.7 kg   SpO2 96%   BMI 35.36 kg/m   Examination:  General exam: Appears calm and comfortable Respiratory system: Diminished to auscultation. Respiratory effort normal. Cardiovascular system: S1 & S2 heard, irregular rhythm, normal rate. No murmurs. Gastrointestinal system: Abdomen is nondistended, soft and nontender. Normal bowel sounds heard. Central nervous system: Alert and oriented. No focal neurological deficits. Psychiatry: Judgement and insight appear normal. Mood & affect appropriate.    Data Reviewed: I have personally reviewed following labs and imaging studies  CBC Lab Results  Component Value Date   WBC 15.5 (H) 01/14/2023   RBC 4.14 01/14/2023   HGB 12.0 01/14/2023   HCT 37.1 01/14/2023   MCV 89.6 01/14/2023   MCH 29.0 01/14/2023   PLT 228 01/14/2023   MCHC 32.3 01/14/2023   RDW 15.2 01/14/2023   LYMPHSABS 2.7 12/25/2022   MONOABS 0.6 11/14/2022   EOSABS 0.2 12/25/2022   BASOSABS 0.0 12/25/2022     Last metabolic panel Lab Results  Component Value Date   NA 128 (L) 01/13/2023   K 4.3 01/13/2023   CL 98 01/13/2023   CO2 19 (L) 01/13/2023    BUN 38 (H) 01/13/2023   CREATININE 1.14 (H) 01/13/2023   GLUCOSE 362 (H) 01/13/2023   GFRNONAA 50 (L) 01/13/2023   GFRAA 71 06/13/2020   CALCIUM 8.2 (L) 01/13/2023   PHOS 3.1 01/13/2023   PROT 6.6 01/13/2023   ALBUMIN 3.3 (L) 01/13/2023   LABGLOB 2.8 11/26/2022   AGRATIO 1.4 09/16/2022   BILITOT 0.5 01/13/2023   ALKPHOS 53 01/13/2023   AST 19 01/13/2023   ALT 16 01/13/2023   ANIONGAP 11 01/13/2023    GFR: Estimated Creatinine Clearance: 43.8 mL/min (A) (by C-G formula based on SCr of 1.14 mg/dL (H)).  Recent Results (from the past 240 hour(s))  MRSA Next Gen by PCR, Nasal     Status: None   Collection Time: 01/12/23  9:43 PM   Specimen: Nasal Mucosa; Nasal Swab  Result Value Ref Range Status   MRSA by PCR Next Gen NOT DETECTED NOT DETECTED Final    Comment: (NOTE) The GeneXpert MRSA Assay (FDA approved for NASAL specimens only), is one component of a comprehensive MRSA colonization surveillance program. It is not intended to diagnose MRSA infection nor to guide or monitor treatment for MRSA infections. Test performance is not FDA approved in patients less than 93 years old. Performed at [facility], [address], [place], [place] [address]       Radiology Studies: CT CHEST WO CONTRAST  Result Date: 01/13/2023 CLINICAL DATA:  Respiratory illness with nondiagnostic x-ray. History of endometrial cancer. EXAM: CT CHEST, ABDOMEN AND PELVIS WITHOUT CONTRAST TECHNIQUE: Multidetector CT imaging of the chest, abdomen and pelvis was performed following the standard protocol without IV contrast. RADIATION DOSE REDUCTION: This exam was performed according to the departmental dose-optimization program which includes automated exposure control, adjustment of the mA and/or kV according to patient size and/or use of iterative reconstruction technique. COMPARISON:  Chest radiograph 01/12/2023 and 11/26/2022. CT chest 09/01/2022 FINDINGS: CT CHEST FINDINGS  Cardiovascular: Mild cardiac enlargement. Calcification in the aortic and mitral valves. No pericardial effusions. Normal caliber thoracic aorta. Calcification of the aorta and coronary arteries. Mediastinum/Nodes: Esophagus is decompressed. Residual contrast material is demonstrated in the esophagus, possibly representing reflux or dysmotility. No significant lymphadenopathy. Thyroid gland is unremarkable. Lungs/Pleura: Patchy ground-glass infiltrates throughout both lungs in a mostly perihilar distribution. This could represent edema, multifocal pneumonia, or air trapping. No pleural effusions. No pneumothorax. Musculoskeletal: Postoperative right shoulder arthroplasty. Old rib fractures. Degenerative changes in the spine. Old appearing endplate compression deformities at T9, T11, and T12. No destructive bone lesions. CT ABDOMEN PELVIS FINDINGS Hepatobiliary: No focal liver abnormality is seen. No gallstones, gallbladder wall thickening, or biliary dilatation. Pancreas: Unremarkable. No pancreatic ductal dilatation or surrounding inflammatory changes. Spleen: Normal in size without focal abnormality. Adrenals/Urinary Tract: No adrenal gland nodules. Mild parenchymal atrophy in the kidneys. No hydronephrosis or hydroureter. No renal, ureteral, or bladder stones. Bladder is normal. Stomach/Bowel: Stomach, small bowel, and colon are not abnormally distended. Contrast material flows through to the cecum without evidence of bowel obstruction. No wall thickening or inflammatory changes are appreciated. There is a duodenal diverticulum. The appendix is normal. Vascular/Lymphatic: Aortic atherosclerosis. No enlarged abdominal or pelvic lymph nodes. Reproductive: Uterus and bilateral adnexa are unremarkable. Other: No abdominal wall hernia or abnormality. No abdominopelvic ascites. Musculoskeletal: Mild endplate compression of L2 appears chronic. Degenerative changes in the spine. No destructive bone lesions. IMPRESSION:  1. Perihilar ground-glass infiltrates in the lungs likely representing edema or multifocal pneumonia. 2. No evidence of metastatic disease seen in the chest, abdomen, or pelvis on noncontrast imaging. 3. Aortic atherosclerosis 4. Multiple chronic appearing endplate compression deformities in the spine likely indicate osteoporosis. Electronically Signed   By: Burman Nieves M.D.   On: 01/13/2023 17:02   CT ABDOMEN PELVIS WO CONTRAST  Result Date: 01/13/2023 CLINICAL DATA:  Respiratory illness with nondiagnostic x-ray. History of endometrial cancer. EXAM: CT CHEST, ABDOMEN AND PELVIS WITHOUT CONTRAST TECHNIQUE: Multidetector CT imaging of the chest, abdomen and pelvis was performed following the standard protocol without IV contrast. RADIATION DOSE REDUCTION: This exam was performed according to the departmental dose-optimization program which includes automated exposure control, adjustment of the mA and/or kV according to patient size and/or use of iterative reconstruction technique. COMPARISON:  Chest radiograph 01/12/2023 and 11/26/2022. CT chest 09/01/2022 FINDINGS: CT CHEST FINDINGS Cardiovascular: Mild cardiac enlargement. Calcification in the aortic and mitral valves. No pericardial effusions. Normal caliber thoracic aorta. Calcification of the aorta and coronary arteries. Mediastinum/Nodes: Esophagus is decompressed. Residual contrast material is demonstrated in the esophagus, possibly representing reflux or dysmotility. No significant lymphadenopathy. Thyroid gland is unremarkable. Lungs/Pleura: Patchy ground-glass infiltrates throughout both lungs in a mostly perihilar distribution. This could represent edema, multifocal pneumonia, or air trapping. No pleural effusions. No pneumothorax. Musculoskeletal: Postoperative right shoulder arthroplasty. Old rib fractures. Degenerative changes in the spine. Old appearing endplate compression deformities at T9, T11, and T12. No destructive bone lesions. CT  ABDOMEN PELVIS FINDINGS Hepatobiliary: No focal liver abnormality is seen. No gallstones, gallbladder wall thickening, or biliary dilatation. Pancreas: Unremarkable. No pancreatic ductal dilatation or surrounding inflammatory changes. Spleen: Normal in size without focal abnormality. Adrenals/Urinary Tract: No adrenal gland nodules. Mild parenchymal atrophy in the kidneys. No hydronephrosis or hydroureter. No renal, ureteral, or bladder stones. Bladder is normal. Stomach/Bowel: Stomach, small bowel, and colon are not abnormally distended. Contrast material flows through to the cecum without evidence of bowel obstruction. No wall thickening or inflammatory changes are appreciated. There is a duodenal diverticulum. The appendix is normal. Vascular/Lymphatic: Aortic atherosclerosis. No enlarged abdominal or pelvic lymph nodes. Reproductive: Uterus and bilateral adnexa are unremarkable. Other: No abdominal wall hernia or abnormality. No abdominopelvic ascites. Musculoskeletal: Mild endplate compression of L2 appears chronic. Degenerative changes in the spine. No destructive bone lesions. IMPRESSION: 1. Perihilar ground-glass infiltrates in the lungs likely representing edema or multifocal pneumonia. 2. No evidence of metastatic disease seen in the chest, abdomen, or pelvis on noncontrast imaging. 3. Aortic atherosclerosis 4. Multiple chronic appearing endplate compression deformities in the spine likely indicate osteoporosis. Electronically Signed   By: Burman Nieves M.D.   On: 01/13/2023 17:02   DG Chest Port 1 View  Result Date: 01/12/2023 CLINICAL DATA:  shob EXAM: PORTABLE CHEST 1 VIEW COMPARISON:  CXR 11/26/22 FINDINGS: Pleural effusion. No pneumothorax. Unchanged cardiac and mediastinal contours. Compared to prior exam there are new hazy opacities in the bilateral mid lung fields, which could represent pulmonary edema or multifocal infection. No radiographically apparent displaced rib fractures. Visualized  upper abdomen unremarkable. Right shoulder arthroplasty. IMPRESSION: Compared to prior exam there are new hazy opacities in the bilateral mid lung fields, which could represent pulmonary edema or multifocal infection. Electronically Signed   By: Lorenza Cambridge M.D.   On: 01/12/2023 14:36      LOS: 2 days    Jacquelin Hawking, MD [facility] 01/14/2023, 9:31 AM   If 7PM-7AM, please contact night-coverage [email or web address]

## 2023-01-14 NOTE — Progress Notes (Signed)
   01/14/23 1018  TOC Brief Assessment  Insurance and Status Reviewed  Patient has primary care physician Yes  Home environment has been reviewed home with spouse  Prior level of function: needs assistance  Prior/Current Home Services No current home services  Social Determinants of Health Reivew SDOH reviewed no interventions necessary  Readmission risk has been reviewed Yes  Transition of care needs no transition of care needs at this time

## 2023-01-14 NOTE — Progress Notes (Signed)
ANTICOAGULATION CONSULT NOTE - Follow Up Consult  Pharmacy Consult for Heparin Indication: atrial fibrillation (apixaban held)  Allergies  Allergen Reactions   Iodine Anaphylaxis   Ivp Dye [Iodinated Contrast Media] Anaphylaxis and Swelling    Throat closes    Latex Other (See Comments)    Latex tape pulls skin with it   Tizanidine Other (See Comments)    Weakness, goofy, bad dreams    Patient Measurements: Height: 5\' 2"  (157.5 cm) Weight: 87.7 kg (193 lb 5.5 oz) IBW/kg (Calculated) : 50.1 Heparin Dosing Weight: 70 kg  Vital Signs: Temp: 98.3 F (36.8 C) (08/13 2310) Temp Source: Oral (08/13 2310) BP: 103/66 (08/14 0300) Pulse Rate: 84 (08/14 0341)  Labs: Recent Labs    01/12/23 1415 01/12/23 1415 01/12/23 1604 01/12/23 1832 01/12/23 2156 01/13/23 0034 01/13/23 0340 01/13/23 1157 01/13/23 2321 01/14/23 0312  HGB 15.3*  --   --   --   --   --  12.9  --   --  12.0  HCT 49.2*  --   --   --   --   --  39.9  --   --  37.1  PLT 277  --   --   --   --   --  217  --   --  228  APTT  --    < >  --  24  --   --  31 42* 51*  --   HEPARINUNFRC  --   --   --  >1.10*  --   --  >1.10*  --   --   --   CREATININE 1.21*  --   --   --   --   --  1.14*  --   --   --   TROPONINIHS 56*  --  377*  --  852* 838*  --   --   --   --    < > = values in this interval not displayed.    Estimated Creatinine Clearance: 43.8 mL/min (A) (by C-G formula based on SCr of 1.14 mg/dL (H)).   Medications:  Scheduled:   allopurinol  100 mg Oral Daily   ARIPiprazole  2 mg Oral QHS   aspirin EC  81 mg Oral Daily   atorvastatin  40 mg Oral Daily   Chlorhexidine Gluconate Cloth  6 each Topical QHS   cholecalciferol  1,000 Units Oral Q1500   cyanocobalamin  1,000 mcg Oral Daily   doxepin  10 mg Oral QHS   escitalopram  10 mg Oral Daily   furosemide  40 mg Intravenous Daily   insulin aspart  0-20 Units Subcutaneous TID PC & HS   insulin glargine-yfgn  20 Units Subcutaneous BID   LORazepam   0.5 mg Oral TID   metoprolol tartrate  50 mg Oral BID   morphine  15 mg Oral Q12H   norethindrone  10 mg Oral Daily   mouth rinse  15 mL Mouth Rinse 4 times per day   OXcarbazepine  150 mg Oral BID   pregabalin  200 mg Oral QHS   Infusions:   heparin 1,550 Units/hr (01/14/23 0117)    Assessment: Pt is a 77 yoF chronically anticoagulated with apixaban for history of atrial fibrillation. Pt presents with tachycardia, respiratory distress > placed on Bipap. Pharmacy consulted to dose heparin for ACS/afib.  Today, 01/14/2023: aPTT 74 sec, increased to therapeutic on heparin 1550 units/hr Continue to use aPTT for monitoring/dosing while heparin level >  1.1 is falsely elevated CBC: Hgb dec to 12 (15.3 on admission, but baseline is 11-13 range), Plt 217   RN confirms no interruption in therapy and no line issues.  No reports of bleeding complications.   Goal of Therapy:  Heparin level 0.3-0.7 units/ml aPTT 66-102 seconds Monitor platelets by anticoagulation protocol: Yes   Plan:  Continue heparin IV infusion 1550 units/hr Confirmatory aPTT in 8 hours  Daily heparin level and CBC Follow up ability to transition back to apixaban following cardiology workup.     Lynann Beaver PharmD, BCPS [facility] main pharmacy [phone] 01/14/2023 9:53 AM

## 2023-01-15 ENCOUNTER — Inpatient Hospital Stay (HOSPITAL_COMMUNITY): Payer: Medicare Other

## 2023-01-15 DIAGNOSIS — I482 Chronic atrial fibrillation, unspecified: Secondary | ICD-10-CM | POA: Diagnosis not present

## 2023-01-15 DIAGNOSIS — R7989 Other specified abnormal findings of blood chemistry: Secondary | ICD-10-CM | POA: Diagnosis not present

## 2023-01-15 DIAGNOSIS — R079 Chest pain, unspecified: Secondary | ICD-10-CM

## 2023-01-15 DIAGNOSIS — I5033 Acute on chronic diastolic (congestive) heart failure: Secondary | ICD-10-CM | POA: Diagnosis not present

## 2023-01-15 DIAGNOSIS — J9621 Acute and chronic respiratory failure with hypoxia: Secondary | ICD-10-CM | POA: Diagnosis not present

## 2023-01-15 LAB — BASIC METABOLIC PANEL
Anion gap: 8 (ref 5–15)
BUN: 30 mg/dL — ABNORMAL HIGH (ref 8–23)
CO2: 25 mmol/L (ref 22–32)
Calcium: 8.5 mg/dL — ABNORMAL LOW (ref 8.9–10.3)
Chloride: 100 mmol/L (ref 98–111)
Creatinine, Ser: 0.7 mg/dL (ref 0.44–1.00)
GFR, Estimated: >60 mL/min (ref >60)
Glucose, Bld: 209 mg/dL — ABNORMAL HIGH (ref 70–99)
Potassium: 4.2 mmol/L (ref 3.5–5.1)
Sodium: 133 mmol/L — ABNORMAL LOW (ref 135–145)

## 2023-01-15 LAB — CBC
HCT: 39.1 % (ref 36.0–46.0)
Hemoglobin: 12.8 g/dL (ref 12.0–15.0)
MCH: 29.2 pg (ref 26.0–34.0)
MCHC: 32.7 g/dL (ref 30.0–36.0)
MCV: 89.3 fL (ref 80.0–100.0)
Platelets: 210 10*3/uL (ref 150–400)
RBC: 4.38 MIL/uL (ref 3.87–5.11)
RDW: 15.2 % (ref 11.5–15.5)
WBC: 9.8 10*3/uL (ref 4.0–10.5)
nRBC: 0 % (ref 0.0–0.2)

## 2023-01-15 LAB — GLUCOSE, CAPILLARY
Glucose-Capillary: 168 mg/dL — ABNORMAL HIGH (ref 70–99)
Glucose-Capillary: 278 mg/dL — ABNORMAL HIGH (ref 70–99)
Glucose-Capillary: 276 mg/dL — ABNORMAL HIGH (ref 70–99)
Glucose-Capillary: 115 mg/dL — ABNORMAL HIGH (ref 70–99)
Glucose-Capillary: 245 mg/dL — ABNORMAL HIGH (ref 70–99)

## 2023-01-15 LAB — TROPONIN I (HIGH SENSITIVITY)
Troponin I (High Sensitivity): 73 ng/L — ABNORMAL HIGH (ref <18)
Troponin I (High Sensitivity): 64 ng/L — ABNORMAL HIGH (ref <18)

## 2023-01-15 LAB — BRAIN NATRIURETIC PEPTIDE: B Natriuretic Peptide: 154.8 pg/mL — ABNORMAL HIGH (ref 0.0–100.0)

## 2023-01-15 LAB — PROCALCITONIN: Procalcitonin: <0.1 ng/mL

## 2023-01-15 MED ORDER — FENTANYL CITRATE PF 50 MCG/ML IJ SOSY
12.5000 ug | PREFILLED_SYRINGE | Freq: Once | INTRAMUSCULAR | Status: AC
  Administered 2023-01-15: 12.5 ug via INTRAVENOUS
  Filled 2023-01-15: qty 1

## 2023-01-15 MED ORDER — ONDANSETRON HCL 4 MG/2ML IJ SOLN
4.0000 mg | Freq: Four times a day (QID) | INTRAMUSCULAR | Status: DC | PRN
  Administered 2023-01-15: 4 mg via INTRAVENOUS
  Filled 2023-01-15: qty 2

## 2023-01-15 MED ORDER — INSULIN ASPART 100 UNIT/ML IJ SOLN
0.0000 [IU] | Freq: Three times a day (TID) | INTRAMUSCULAR | Status: DC
  Administered 2023-01-15 (×2): 7 [IU] via SUBCUTANEOUS
  Administered 2023-01-15: 11 [IU] via SUBCUTANEOUS
  Administered 2023-01-16: 20 [IU] via SUBCUTANEOUS
  Administered 2023-01-16: 11 [IU] via SUBCUTANEOUS
  Administered 2023-01-16: 4 [IU] via SUBCUTANEOUS
  Administered 2023-01-16: 7 [IU] via SUBCUTANEOUS
  Administered 2023-01-17: 20 [IU] via SUBCUTANEOUS
  Administered 2023-01-17: 11 [IU] via SUBCUTANEOUS
  Administered 2023-01-17: 4 [IU] via SUBCUTANEOUS
  Administered 2023-01-17 – 2023-01-18 (×2): 11 [IU] via SUBCUTANEOUS
  Administered 2023-01-18: 15 [IU] via SUBCUTANEOUS

## 2023-01-15 MED ORDER — KETOROLAC TROMETHAMINE 30 MG/ML IJ SOLN
15.0000 mg | Freq: Once | INTRAMUSCULAR | Status: AC
  Administered 2023-01-15: 15 mg via INTRAVENOUS
  Filled 2023-01-15: qty 1

## 2023-01-15 MED ORDER — NITROGLYCERIN 0.4 MG SL SUBL
SUBLINGUAL_TABLET | SUBLINGUAL | Status: AC
  Administered 2023-01-15: 0.4 mg
  Filled 2023-01-15: qty 1

## 2023-01-15 MED ORDER — NITROGLYCERIN 0.4 MG SL SUBL
0.4000 mg | SUBLINGUAL_TABLET | SUBLINGUAL | Status: DC | PRN
  Administered 2023-01-15: 0.4 mg via SUBLINGUAL
  Filled 2023-01-15: qty 1

## 2023-01-15 NOTE — Progress Notes (Signed)
Patient Name: Amber Stephenson Date of Encounter: 01/15/2023 [facility] Cardiologist: Armanda Magic, MD   Interval Summary  .    Overall patient feels better today. Breathing is improving but is not yet back to baseline. She continues to have orthopnea and abdominal distention. Did have an episode of chest pain overnight but is chest pain free this AM   Vital Signs .    Vitals:   01/15/23 0600 01/15/23 0700 01/15/23 0752 01/15/23 0803  BP: (!) 113/58 114/68    Pulse: 86 85  81  Resp: 16 19  17   Temp:      TempSrc:      SpO2: 99% 99%  95%  Weight:   84.5 kg   Height:        Intake/Output Summary (Last 24 hours) at 01/15/2023 0804 Last data filed at 01/14/2023 2033 Gross per 24 hour  Intake 611.7 ml  Output 1175 ml  Net -563.3 ml      01/15/2023    7:52 AM 01/13/2023    4:57 AM 01/12/2023    9:30 PM  Last 3 Weights  Weight (lbs) 186 lb 4.6 oz 193 lb 5.5 oz 193 lb 5.5 oz  Weight (kg) 84.5 kg 87.7 kg 87.7 kg      Telemetry/ECG    Atrial fibrillation, HR in the 70s-90s - Personally Reviewed  Physical Exam .   GEN: No acute distress.  Sitting upright in the bed eating breakfast  Neck: No JVD Cardiac: Irregular rate and rhythm. no murmurs, rubs, or gallops. Radial pulses 2+ bilaterally  Respiratory: Crackles in lung bases. Normal work of breathing on 2 L via [place]  GI: Soft, nontender, mildly distended  MS: No edema in BLE   Assessment & Plan .     Nonobstructive CAD  Elevated Troponin  - Patient previously had cardiac catheterization in 2019 that showed nonbstructive CAD  - Patient presented for elective CT- developed sudden onset of resting dypsnea. Went into afib with RVR. Found to have SBP in the 190s. No chest pain  - hsTn [id number] - Likely demand ischemia in the setting of acute hypoxemia, acute CHF, HTN urgency, afib with RVR, AKI  - Patient has not had angina at home- reassuring.  - Echocardiogram this admission showed EF 60-65%, no regional  wall motion abnormalities, mild LVH, normal RV function - Patient did have an episode of retrosternal chest pain that radiated to the right side overnight. Pain improved after SL nitroglycerin and fentanyl. Per notes overnight, pain was reproducible on palpation. hsTn 73>64 (downtrending from 800s on 8/13). Chest pain free this AM  - No plans for ischemic workup this admission. Can consider outpatient stress test  - Patient now off IV heparin and back on eliquis  - Continue metoprolol tartate 50 mg BID  - Continue lipitor 40 mg daily (new this admission)   - Not on asa due to eliquis use    Acute on Chronic Diastolic Heart Failure  Acute hypoxemic respiratory failure  - Most recent echocardiogram from 11/2022 showed EF 65-70%, no regional wall motion abnormalities, mild LVH - Presented complaining of increasing shortness of breath  - CT chest 8/13 showed perihilar ground-glass infiltrates in the lungs, likely representing edema or multifocal pneumonia. Note, WBC have been elevated but procalcitonin normal  - Suspect patient developed flash pulmonary edema in the setting of HTN urgency and Afib with RVR.  - Echo this admission showed EF 60 to 65% with no wall motion abnormalities  and mild LVH  - She has been on IV lasix- output 1.175 L urine yesterday. Weight down 7 lbs since admission. Renal function stable and creatinine is at baseline (0.70). BNP has improved from 446 on admission to 154 today. CXR  - Continue IV lasix 40 mg BID today- I do think that she is very close to euvolemia and hopefully can be transitioned to PO lasix tomorrow  - Discussed starting jardiance or farxiga- unfortunately, patient had previously been on jardiance for diabetes and developed yeast infections. Hold off on SGLT2i    Permanent atrial Fibrillation  - Patient initially presented in RVR with HR elevated to the 150s. She was started on dilt gtt with improvement. Now off dilt  - HR well controlled per tele  -  Continue lopressor 50 mg BID  - Off IV heparin and back on eliquis 5 mg BID    HLD  - Lipid panel this admission showed LDL 141  -  Continue lipitor 40 mg daily (new this admission)  - Needs LFTs and lipid panel in 8 weeks    Otherwise per primary  - Leukocytosis  - Type 2 DM, poorly controlled - Endometrial Cancer  - AKI  - Hypoalbuminemia  - Anxiety   For questions or updates, please contact [facility] Please consult [email or web address] for contact info under        Signed, Jonita Albee, PA-C

## 2023-01-15 NOTE — Progress Notes (Addendum)
    Patient Name: THEOLA GACHUPIN           DOB: [birth year]  MRN: [id number]      Admission Date: 01/12/2023  Attending Provider: Narda Bonds, MD  Primary Diagnosis: Acute on chronic hypoxic respiratory failure [facility])   Level of care: Stepdown    CROSS COVER NOTE   Date of Service   01/15/2023   [name], 77 y.o. female, was admitted on 01/12/2023 for Acute on chronic hypoxic respiratory failure (HCC) secondary to flash pulmonary edema in relation to A-fib RVR and acute on chronic CHF.    Troponin peak 852 (without chest pain) likely related to demand ischemia. Afib controlled with Lopressor. BNP 446. 2D echo- normal LV function, EF 60 to 65% with no wall motion abnormalities. No further ischemic workup recommended by cardiology.  Patient was transitioned from IV heparin back to Eliquis (8/14).     HPI/Events of Note   Chest Pain-  PMH: permanent atrial fibrillation, nonobstructive CAD, HFpEF, hypertension, hyperlipidemia OSA intolerant to CPAP, type 2 diabetes mellitus, CKD stage III AA, bipolar disorder.  8/10 retrosternal chest pain. Some pain radiating to right side above breast. Described as "pressure and tight."  Pain occurred while resting, and is not affected by deep inspiration or change in position.   Pain improved with sublingual nitroglycerin. Brought down from 8 to 5/10. SBP dropped to 90's after 2 doses. Will trial low dose fentanyl for moderate pain remaining.   At bedside, pt is A/O, does not appear to be in acute distress.  VSS, afebrile and normotensive. Denies dyspnea, cough, fatigue, palpitations, dizziness, abdominal pain, nausea, vomiting, heartburn.   Crackles at bases. S1 and S2 heard, irregular rhythm.  No pitting edema.  Pain is reproducible on palpation.  Per patient "exact pain" was reproduced. Front chest wall tenderness on palpation noted.   Addendum- Chest pain improved after SL nitroglycerin x2 and 12.5 mcg IV Fentanyl. Resting well.     Addendum 7829- Chest pain free after receiving Toradol this morning. Hemodynamically stable.    Interventions/ Plan   EKG - Atrial fib (HR 84), some ST segment change to inferior  Troponin --> 73 --> 64 BNP --> 154.8 (trended down) PRN Nitroglycerin x2 IV Fentanyl x1, Toradol x1 Supplemental Oxygen as needed. Consider neb for CP 2/2 demand Chest xray- Left basilar airspace opacity.  Consider edema vs PNA vs atelectasis.  WBC uptrending, however repeat procalcitonin <0.10.       Anthoney Harada, DNP, ACNPC- AG [facility]

## 2023-01-15 NOTE — Plan of Care (Signed)
  Problem: Fluid Volume: Goal: Ability to maintain a balanced intake and output will improve Outcome: Progressing   Problem: Skin Integrity: Goal: Risk for impaired skin integrity will decrease Outcome: Progressing   Problem: Education: Goal: Knowledge of General Education information will improve Description: Including pain rating scale, medication(s)/side effects and non-pharmacologic comfort measures Outcome: Progressing   Problem: Health Behavior/Discharge Planning: Goal: Ability to manage health-related needs will improve Outcome: Progressing   Problem: Clinical Measurements: Goal: Will remain free from infection Outcome: Progressing   Problem: Nutrition: Goal: Adequate nutrition will be maintained Outcome: Progressing   Problem: Coping: Goal: Level of anxiety will decrease Outcome: Progressing   Problem: Elimination: Goal: Will not experience complications related to urinary retention Outcome: Progressing   Problem: Pain Managment: Goal: General experience of comfort will improve Outcome: Progressing   Problem: Safety: Goal: Ability to remain free from injury will improve Outcome: Progressing   Problem: Skin Integrity: Goal: Risk for impaired skin integrity will decrease Outcome: Progressing

## 2023-01-15 NOTE — Progress Notes (Signed)
PROGRESS NOTE    [name]  WUJ:[id number] DOB: 03-06-1946 DOA: 01/12/2023 PCP: Mechele Claude, MD   Brief Narrative: Amber Stephenson is a 77 y.o. female with a history of chronic diastolic heart failure, chronic atrial fibrillation on Eliquis, chronic respite failure with hypoxia on 2 L/min of oxygen, insulin-dependent type 2 diabetes, primary hypertension, hyperlipidemia, bipolar 1 disorder, endometrial cancer.  Patient presented secondary to development of acute respiratory distress while attempting to lay flat for CT scan and was found to have evidence of likely flash pulmonary edema from atrial fibrillation with RVR. Cardiology consulted. Patient started on diuresis and management for atrial fibrillation.   Assessment and Plan:  Acute on chronic respiratory failure with hypoxia Secondary to flash pulmonary edema in relation to atrial fibrillation with RVR with development of acute on chronic heart failure.  It is possible that it could be an component of multifocal pneumonia as seen on CT imaging.  Patient with documented hypoxia of SpO2 down to 72% on 4 L/minute of oxygen supplementation requiring BiPAP for management. -Continue BiPAP as needed for respiratory distress -Wean to home 2 L/min of oxygen as able  Acute on chronic diastolic heart failure In setting of atrial fibrillation with rapid ventricular rate.  Echocardiogram this admission was significant for an LVEF of 60 to 65% with indeterminant diastolic function.  Troponin elevation with a peak of 852.  Cardiology consulted and patient was started on Lasix IV diuresis.  Urine output of only 700 mL over the last 24 hours however there were 3 unmeasured urine current status.  Weight of 87.7 kg on admission and no weight available today. -Cardiology recommendations: Lasix IV -Daily weights, strict ins and out  Demand ischemia Peak troponin of 852.  Per cardiology likely demand ischemia in setting of atrial fibrillation  with RVR in addition to acute heart failure.  Recommendation for outpatient stress test versus PET.  Recommendation to continue heparin IV for now.  Permanent atrial fibrillation with RVR Patient is managed on metoprolol 50 mg twice daily and Eliquis as an outpatient.  Patient presented with RVR with with heart rates into the 150s.  She was started on diltiazem drip with resultant hypotension.  Diltiazem discontinued with continued stable heart rates. -Cardiology recommendations: Continue metoprolol, continue heparin IV  Hypotension Secondary to medication effect of diltiazem infusion in addition to Lasix IV diuresis.  Diltiazem IV infusion discontinued with resolution of hypotension.  AKI Baseline creatinine is about 0.8-0.9.  Creatinine of 1.21 on admission likely related to fluid overload.  Creatinine down to 1.14 with IV diuresis.  No BMP available at this morning. -Daily BMP while on IV Lasix  Dependent diabetes mellitus type 2 Uncontrolled with hemoglobin A1c of 11.3%.  Patient is on insulin glargine 35 units twice daily in addition to insulin lispro 3 times daily via sliding scale, metformin. -Continue insulin glargine 20 units twice daily and sliding scale insulin -Hold metformin  High anion gap metabolic acidosis Likely related to lactic acidosis and possibly contributed to by acute kidney injury.  Anion gap closed and acidosis has improved slightly.  Chronic pain -Continue MS Contin 15 mg twice daily  Hyponatremia Appears to be a component of pseudohyponatremia secondary to hyperglycemia.  Corrected sodium of 132 from BMP yesterday. -Follow-up BMP  Chest pain Episode overnight. Right-sided pressure-like. Chest x-ray obtained with mild patchiness. Troponin obtained as well and was elevated but significantly down from prior. Pain did improve with nitroglycerin, however. Cardiology is following.  Hypoalbuminemia Mild.  Nonobstructive CAD Noted.  Patient with elevated  troponin this admission as mentioned above, likely related to demand ischemia. -Continue aspirin and Lipitor  Hyperlipidemia -Continue Lipitor  Anxiety -Continue Ativan 0.5 mg 3 times daily -Add hydroxyzine 25 mg as needed  Obesity Estimated body mass index is 34.07 kg/m as calculated from the following:   Height as of this encounter: 5\' 2"  (1.575 m).   Weight as of this encounter: 84.5 kg.   DVT prophylaxis: Heparin IV Code Status:   Code Status: Full Code Family Communication: Husband at bedside Disposition Plan: Transfer out of ICU/stepdown once not requiring BiPAP PRN, likely in 24 hours.  Discharge home pending ability to remain stable off of BiPAP in addition to completing IV Lasix diuresis, likely in 48-72  hours.   Consultants:  Cardiology  Procedures:  8/14: Transthoracic Echocardiogram  Antimicrobials: None    Subjective: Patient reports some improvement in breathing. Chest pain episode overnight that has resolved.  Objective: BP 114/68   Pulse 81   Temp 97.9 F (36.6 C) (Oral)   Resp 17   Ht 5\' 2"  (1.575 m)   Wt 84.5 kg   SpO2 92%   BMI 34.07 kg/m   Examination:  General exam: Appears calm and comfortable Respiratory system: Slight tachypnea, right > left rales.  Cardiovascular system: S1 & S2 heard, RRR. No murmurs, rubs, gallops or clicks. Gastrointestinal system: Abdomen is nondistended, soft and nontender. Normal bowel sounds heard. Central nervous system: Alert and oriented. Musculoskeletal: No edema. No calf tenderness Skin: No cyanosis. No rashes Psychiatry: Judgement and insight appear normal. Mood & affect appropriate.    Data Reviewed: I have personally reviewed following labs and imaging studies  CBC Lab Results  Component Value Date   WBC 9.8 01/15/2023   RBC 4.38 01/15/2023   HGB 12.8 01/15/2023   HCT 39.1 01/15/2023   MCV 89.3 01/15/2023   MCH 29.2 01/15/2023   PLT 210 01/15/2023   MCHC 32.7 01/15/2023   RDW 15.2  01/15/2023   LYMPHSABS 2.7 12/25/2022   MONOABS 0.6 11/14/2022   EOSABS 0.2 12/25/2022   BASOSABS 0.0 12/25/2022     Last metabolic panel Lab Results  Component Value Date   NA 133 (L) 01/15/2023   K 4.2 01/15/2023   CL 100 01/15/2023   CO2 25 01/15/2023   BUN 30 (H) 01/15/2023   CREATININE 0.70 01/15/2023   GLUCOSE 209 (H) 01/15/2023   GFRNONAA >60 01/15/2023   GFRAA 71 06/13/2020   CALCIUM 8.5 (L) 01/15/2023   PHOS 3.1 01/13/2023   PROT 6.6 01/13/2023   ALBUMIN 3.3 (L) 01/13/2023   LABGLOB 2.8 11/26/2022   AGRATIO 1.4 09/16/2022   BILITOT 0.5 01/13/2023   ALKPHOS 53 01/13/2023   AST 19 01/13/2023   ALT 16 01/13/2023   ANIONGAP 8 01/15/2023    GFR: Estimated Creatinine Clearance: 61.3 mL/min (by C-G formula based on SCr of 0.7 mg/dL).  Recent Results (from the past 240 hour(s))  MRSA Next Gen by PCR, Nasal     Status: None   Collection Time: 01/12/23  9:43 PM   Specimen: Nasal Mucosa; Nasal Swab  Result Value Ref Range Status   MRSA by PCR Next Gen NOT DETECTED NOT DETECTED Final    Comment: (NOTE) The GeneXpert MRSA Assay (FDA approved for NASAL specimens only), is one component of a comprehensive MRSA colonization surveillance program. It is not intended to diagnose MRSA infection nor to guide or monitor treatment for MRSA infections. Test performance is not  FDA approved in patients less than 33 years old. Performed at [facility], [address] [place], [place] [address]       Radiology Studies: [facility] Chest Port 1 View  Result Date: 01/15/2023 CLINICAL DATA:  Chest pain EXAM: PORTABLE CHEST 1 VIEW COMPARISON:  01/12/2023 FINDINGS: Cardiac shadow is enlarged but stable. Lungs are well aerated bilaterally. Left basilar airspace opacity is noted. No other focal abnormality is noted. IMPRESSION: Left basilar airspace opacity. Electronically Signed   By: Alcide Clever M.D.   On: 01/15/2023 03:58   ECHOCARDIOGRAM COMPLETE  Result Date:  01/14/2023    ECHOCARDIOGRAM REPORT   Patient Name:   Amber Stephenson Swiss Date of Exam: 01/14/2023 Medical Rec #:  [id number]         Height:       62.0 in Accession #:    [id number]        Weight:       193.3 lb Date of Birth:  12-17-1945          BSA:          1.884 m Patient Age:    77 years          BP:           112/71 mmHg Patient Gender: F                 HR:           77 bpm. Exam Location:  Inpatient Procedure: 2D Echo, Color Doppler and Cardiac Doppler Indications:    CHF-Acute Diastolic I50.31  History:        Patient has prior history of Echocardiogram examinations, most                 recent 11/10/2022. CHF and Cardiomegaly, CAD, Pulmonary HTN,                 Arrythmias:Atrial Fibrillation, Signs/Symptoms:Hypotension; Risk                 Factors:Diabetes, Non-Smoker, Hypertension, Sleep Apnea and                 Dyslipidemia.  Sonographer:    Aron Baba Referring Phys: [id number] Cyndi Bender  Sonographer Comments: Patient is obese and suboptimal parasternal window. Image acquisition challenging due to respiratory motion. IMPRESSIONS  1. Left ventricular ejection fraction, by estimation, is 60 to 65%. The left ventricle has normal function. The left ventricle has no regional wall motion abnormalities. There is mild concentric left ventricular hypertrophy. Left ventricular diastolic function could not be evaluated. Elevated left atrial pressure.  2. Right ventricular systolic function is normal. The right ventricular size is normal. Tricuspid regurgitation signal is inadequate for assessing PA pressure.  3. Left atrial size was severely dilated.  4. Right atrial size was mildly dilated.  5. The mitral valve is normal in structure. Trivial mitral valve regurgitation. No evidence of mitral stenosis. Moderate mitral annular calcification.  6. The aortic valve has an indeterminant number of cusps. Aortic valve regurgitation is mild. No aortic stenosis is present.  7. Aortic dilatation noted. There is mild  dilatation of the aortic root, measuring 40 mm.  8. The inferior vena cava is dilated in size with <50% respiratory variability, suggesting right atrial pressure of 15 mmHg. FINDINGS  Left Ventricle: Left ventricular ejection fraction, by estimation, is 60 to 65%. The left ventricle has normal function. The left ventricle has no regional wall motion abnormalities. The left ventricular  internal cavity size was normal in size. There is  mild concentric left ventricular hypertrophy. Left ventricular diastolic function could not be evaluated due to atrial fibrillation. Left ventricular diastolic function could not be evaluated. Elevated left atrial pressure. Right Ventricle: The right ventricular size is normal. Right ventricular systolic function is normal. Tricuspid regurgitation signal is inadequate for assessing PA pressure. The tricuspid regurgitant velocity is 0.72 m/s, and with an assumed right atrial  pressure of 15 mmHg, the estimated right ventricular systolic pressure is 17.1 mmHg. Left Atrium: Left atrial size was severely dilated. Right Atrium: Right atrial size was mildly dilated. Pericardium: There is no evidence of pericardial effusion. Mitral Valve: The mitral valve is normal in structure. Moderate mitral annular calcification. Trivial mitral valve regurgitation. No evidence of mitral valve stenosis. Tricuspid Valve: The tricuspid valve is normal in structure. Tricuspid valve regurgitation is not demonstrated. No evidence of tricuspid stenosis. Aortic Valve: The aortic valve has an indeterminant number of cusps. Aortic valve regurgitation is mild. Aortic regurgitation PHT measures 682 msec. No aortic stenosis is present. Pulmonic Valve: The pulmonic valve was normal in structure. Pulmonic valve regurgitation is not visualized. No evidence of pulmonic stenosis. Aorta: Aortic dilatation noted. There is mild dilatation of the aortic root, measuring 40 mm. Venous: The inferior vena cava is dilated in size  with less than 50% respiratory variability, suggesting right atrial pressure of 15 mmHg. IAS/Shunts: No atrial level shunt detected by color flow Doppler.  LEFT VENTRICLE PLAX 2D LVIDd:         4.60 cm   Diastology LVIDs:         3.20 cm   LV e' medial:    3.70 cm/s LV PW:         0.90 cm   LV E/e' medial:  34.1 LV IVS:        0.70 cm   LV e' lateral:   4.24 cm/s LVOT diam:     2.20 cm   LV E/e' lateral: 29.7 LV SV:         60 LV SV Index:   32 LVOT Area:     3.80 cm  RIGHT VENTRICLE RV S prime:     5.11 cm/s TAPSE (M-mode): 1.4 cm LEFT ATRIUM              Index        RIGHT ATRIUM           Index LA diam:        5.00 cm  2.65 cm/m   RA Area:     20.00 cm LA Vol (A2C):   154.0 ml 81.72 ml/m  RA Volume:   50.60 ml  26.85 ml/m LA Vol (A4C):   124.0 ml 65.80 ml/m LA Biplane Vol: 140.0 ml 74.29 ml/m  AORTIC VALVE LVOT Vmax:   82.00 cm/s LVOT Vmean:  56.300 cm/s LVOT VTI:    0.158 m AI PHT:      682 msec  AORTA Ao Root diam: 4.00 cm Ao Asc diam:  3.40 cm MITRAL VALVE                TRICUSPID VALVE MV Area (PHT): 3.99 cm     TR Peak grad:   2.1 mmHg MV Decel Time: 190 msec     TR Vmax:        71.70 cm/s MR Peak grad: 28.7 mmHg MR Vmax:      268.00 cm/s   SHUNTS MV E velocity: 126.00 cm/s  Systemic VTI:  0.16 m                             Systemic Diam: 2.20 cm Olga Millers MD Electronically signed by Olga Millers MD Signature Date/Time: 01/14/2023/10:12:28 AM    Final    CT CHEST WO CONTRAST  Result Date: 01/13/2023 CLINICAL DATA:  Respiratory illness with nondiagnostic x-ray. History of endometrial cancer. EXAM: CT CHEST, ABDOMEN AND PELVIS WITHOUT CONTRAST TECHNIQUE: Multidetector CT imaging of the chest, abdomen and pelvis was performed following the standard protocol without IV contrast. RADIATION DOSE REDUCTION: This exam was performed according to the departmental dose-optimization program which includes automated exposure control, adjustment of the mA and/or kV according to patient size and/or use of  iterative reconstruction technique. COMPARISON:  Chest radiograph 01/12/2023 and 11/26/2022. CT chest 09/01/2022 FINDINGS: CT CHEST FINDINGS Cardiovascular: Mild cardiac enlargement. Calcification in the aortic and mitral valves. No pericardial effusions. Normal caliber thoracic aorta. Calcification of the aorta and coronary arteries. Mediastinum/Nodes: Esophagus is decompressed. Residual contrast material is demonstrated in the esophagus, possibly representing reflux or dysmotility. No significant lymphadenopathy. Thyroid gland is unremarkable. Lungs/Pleura: Patchy ground-glass infiltrates throughout both lungs in a mostly perihilar distribution. This could represent edema, multifocal pneumonia, or air trapping. No pleural effusions. No pneumothorax. Musculoskeletal: Postoperative right shoulder arthroplasty. Old rib fractures. Degenerative changes in the spine. Old appearing endplate compression deformities at T9, T11, and T12. No destructive bone lesions. CT ABDOMEN PELVIS FINDINGS Hepatobiliary: No focal liver abnormality is seen. No gallstones, gallbladder wall thickening, or biliary dilatation. Pancreas: Unremarkable. No pancreatic ductal dilatation or surrounding inflammatory changes. Spleen: Normal in size without focal abnormality. Adrenals/Urinary Tract: No adrenal gland nodules. Mild parenchymal atrophy in the kidneys. No hydronephrosis or hydroureter. No renal, ureteral, or bladder stones. Bladder is normal. Stomach/Bowel: Stomach, small bowel, and colon are not abnormally distended. Contrast material flows through to the cecum without evidence of bowel obstruction. No wall thickening or inflammatory changes are appreciated. There is a duodenal diverticulum. The appendix is normal. Vascular/Lymphatic: Aortic atherosclerosis. No enlarged abdominal or pelvic lymph nodes. Reproductive: Uterus and bilateral adnexa are unremarkable. Other: No abdominal wall hernia or abnormality. No abdominopelvic ascites.  Musculoskeletal: Mild endplate compression of L2 appears chronic. Degenerative changes in the spine. No destructive bone lesions. IMPRESSION: 1. Perihilar ground-glass infiltrates in the lungs likely representing edema or multifocal pneumonia. 2. No evidence of metastatic disease seen in the chest, abdomen, or pelvis on noncontrast imaging. 3. Aortic atherosclerosis 4. Multiple chronic appearing endplate compression deformities in the spine likely indicate osteoporosis. Electronically Signed   By: Burman Nieves M.D.   On: 01/13/2023 17:02   CT ABDOMEN PELVIS WO CONTRAST  Result Date: 01/13/2023 CLINICAL DATA:  Respiratory illness with nondiagnostic x-ray. History of endometrial cancer. EXAM: CT CHEST, ABDOMEN AND PELVIS WITHOUT CONTRAST TECHNIQUE: Multidetector CT imaging of the chest, abdomen and pelvis was performed following the standard protocol without IV contrast. RADIATION DOSE REDUCTION: This exam was performed according to the departmental dose-optimization program which includes automated exposure control, adjustment of the mA and/or kV according to patient size and/or use of iterative reconstruction technique. COMPARISON:  Chest radiograph 01/12/2023 and 11/26/2022. CT chest 09/01/2022 FINDINGS: CT CHEST FINDINGS Cardiovascular: Mild cardiac enlargement. Calcification in the aortic and mitral valves. No pericardial effusions. Normal caliber thoracic aorta. Calcification of the aorta and coronary arteries. Mediastinum/Nodes: Esophagus is decompressed. Residual contrast material is demonstrated in the esophagus, possibly representing reflux or dysmotility.  No significant lymphadenopathy. Thyroid gland is unremarkable. Lungs/Pleura: Patchy ground-glass infiltrates throughout both lungs in a mostly perihilar distribution. This could represent edema, multifocal pneumonia, or air trapping. No pleural effusions. No pneumothorax. Musculoskeletal: Postoperative right shoulder arthroplasty. Old rib  fractures. Degenerative changes in the spine. Old appearing endplate compression deformities at T9, T11, and T12. No destructive bone lesions. CT ABDOMEN PELVIS FINDINGS Hepatobiliary: No focal liver abnormality is seen. No gallstones, gallbladder wall thickening, or biliary dilatation. Pancreas: Unremarkable. No pancreatic ductal dilatation or surrounding inflammatory changes. Spleen: Normal in size without focal abnormality. Adrenals/Urinary Tract: No adrenal gland nodules. Mild parenchymal atrophy in the kidneys. No hydronephrosis or hydroureter. No renal, ureteral, or bladder stones. Bladder is normal. Stomach/Bowel: Stomach, small bowel, and colon are not abnormally distended. Contrast material flows through to the cecum without evidence of bowel obstruction. No wall thickening or inflammatory changes are appreciated. There is a duodenal diverticulum. The appendix is normal. Vascular/Lymphatic: Aortic atherosclerosis. No enlarged abdominal or pelvic lymph nodes. Reproductive: Uterus and bilateral adnexa are unremarkable. Other: No abdominal wall hernia or abnormality. No abdominopelvic ascites. Musculoskeletal: Mild endplate compression of L2 appears chronic. Degenerative changes in the spine. No destructive bone lesions. IMPRESSION: 1. Perihilar ground-glass infiltrates in the lungs likely representing edema or multifocal pneumonia. 2. No evidence of metastatic disease seen in the chest, abdomen, or pelvis on noncontrast imaging. 3. Aortic atherosclerosis 4. Multiple chronic appearing endplate compression deformities in the spine likely indicate osteoporosis. Electronically Signed   By: Burman Nieves M.D.   On: 01/13/2023 17:02      LOS: 3 days    Jacquelin Hawking, MD [facility] 01/15/2023, 9:29 AM   If 7PM-7AM, please contact night-coverage [email or web address]

## 2023-01-16 ENCOUNTER — Ambulatory Visit: Payer: Medicare Other

## 2023-01-16 DIAGNOSIS — J9621 Acute and chronic respiratory failure with hypoxia: Secondary | ICD-10-CM | POA: Diagnosis not present

## 2023-01-16 DIAGNOSIS — I5033 Acute on chronic diastolic (congestive) heart failure: Secondary | ICD-10-CM | POA: Diagnosis not present

## 2023-01-16 DIAGNOSIS — I482 Chronic atrial fibrillation, unspecified: Secondary | ICD-10-CM | POA: Diagnosis not present

## 2023-01-16 LAB — GLUCOSE, CAPILLARY
Glucose-Capillary: 219 mg/dL — ABNORMAL HIGH (ref 70–99)
Glucose-Capillary: 251 mg/dL — ABNORMAL HIGH (ref 70–99)
Glucose-Capillary: 185 mg/dL — ABNORMAL HIGH (ref 70–99)
Glucose-Capillary: 356 mg/dL — ABNORMAL HIGH (ref 70–99)

## 2023-01-16 LAB — BASIC METABOLIC PANEL
Anion gap: 6 (ref 5–15)
BUN: 34 mg/dL — ABNORMAL HIGH (ref 8–23)
CO2: 29 mmol/L (ref 22–32)
Calcium: 8.8 mg/dL — ABNORMAL LOW (ref 8.9–10.3)
Chloride: 96 mmol/L — ABNORMAL LOW (ref 98–111)
Creatinine, Ser: 0.94 mg/dL (ref 0.44–1.00)
GFR, Estimated: >60 mL/min (ref >60)
Glucose, Bld: 189 mg/dL — ABNORMAL HIGH (ref 70–99)
Potassium: 4.6 mmol/L (ref 3.5–5.1)
Sodium: 131 mmol/L — ABNORMAL LOW (ref 135–145)

## 2023-01-16 LAB — CBC
HCT: 39.1 % (ref 36.0–46.0)
Hemoglobin: 12.6 g/dL (ref 12.0–15.0)
MCH: 28.8 pg (ref 26.0–34.0)
MCHC: 32.2 g/dL (ref 30.0–36.0)
MCV: 89.3 fL (ref 80.0–100.0)
Platelets: 233 10*3/uL (ref 150–400)
RBC: 4.38 MIL/uL (ref 3.87–5.11)
RDW: 15 % (ref 11.5–15.5)
WBC: 11.4 10*3/uL — ABNORMAL HIGH (ref 4.0–10.5)
nRBC: 0 % (ref 0.0–0.2)

## 2023-01-16 MED ORDER — ORAL CARE MOUTH RINSE: 15.0000 mL | OROMUCOSAL | Status: DC | PRN

## 2023-01-16 MED ORDER — FUROSEMIDE 10 MG/ML IJ SOLN
40.0000 mg | Freq: Two times a day (BID) | INTRAMUSCULAR | Status: DC
  Administered 2023-01-16: 40 mg via INTRAVENOUS
  Filled 2023-01-16: qty 4

## 2023-01-16 MED ORDER — INSULIN GLARGINE-YFGN 100 UNIT/ML ~~LOC~~ SOLN
30.0000 [IU] | Freq: Two times a day (BID) | SUBCUTANEOUS | Status: DC
  Administered 2023-01-16 – 2023-01-18 (×4): 30 [IU] via SUBCUTANEOUS
  Filled 2023-01-16 (×5): qty 0.3

## 2023-01-16 MED ORDER — FUROSEMIDE 40 MG PO TABS: 40.0000 mg | ORAL_TABLET | Freq: Two times a day (BID) | ORAL | Status: DC

## 2023-01-16 NOTE — Evaluation (Signed)
Physical Therapy Evaluation Patient Details Name: Amber Stephenson MRN: [id number] DOB: [birth year] Today's Date: 01/16/2023  History of Present Illness  77 y.o. female with a history of chronic diastolic heart failure, chronic atrial fibrillation on Eliquis, chronic respite failure with hypoxia on 2 L/min of oxygen, insulin-dependent type 2 diabetes, primary hypertension, hyperlipidemia, bipolar 1 disorder, endometrial cancer.  Patient presented secondary to development of acute respiratory distress while attempting to lay flat for CT scan and was found to have evidence of likely flash pulmonary edema from atrial fibrillation with RVR. Cardiology consulted. Patient started on diuresis and management for atrial fibrillation.  Clinical Impression  Pt admitted with above diagnosis.  Pt currently with functional limitations due to the deficits listed below (see PT Problem List). Pt will benefit from acute skilled PT to increase their independence and safety with mobility to allow discharge.  Pt assisted with ambulating and able to tolerate 120 ft in hallway with RW (see mobility section below for more details).  Distance mostly limited by pain in feet from gout per pt.  Pt does have RW at home and agreeable to use upon d/c.  Pt and spouse agreeable for HHPT.         If plan is discharge home, recommend the following: Help with stairs or ramp for entrance;Assistance with cooking/housework   Can travel by private vehicle        Equipment Recommendations None recommended by PT  Recommendations for Other Services       Functional Status Assessment Patient has had a recent decline in their functional status and demonstrates the ability to make significant improvements in function in a reasonable and predictable amount of time.     Precautions / Restrictions Precautions Precautions: Fall Precaution Comments: chronic 2L O2      Mobility  Bed Mobility Overal bed mobility: Needs Assistance Bed  Mobility: Supine to Sit     Supine to sit: Supervision, HOB elevated     General bed mobility comments: supervision for lines/safety    Transfers Overall transfer level: Needs assistance Equipment used: None Transfers: Sit to/from Stand Sit to Stand: Contact guard assist                Ambulation/Gait Ambulation/Gait assistance: Contact guard assist Gait Distance (Feet): 120 Feet Assistive device: Rolling walker (2 wheels) Gait Pattern/deviations: Step-through pattern, Decreased stride length Gait velocity: decr     General Gait Details: pt ambulated a few feet in room without RW however reporting bil foot pain due to gout so provided RW for endurance and pain control, SpO2 96-97% on 2L O2 [place], HR up to 116 bpm  Stairs            Wheelchair Mobility     Tilt Bed    Modified Rankin (Stroke Patients Only)       Balance Overall balance assessment: History of Falls                                           Pertinent Vitals/Pain Pain Assessment Pain Assessment: Faces Faces Pain Scale: Hurts even more Pain Location: bil feet "gout" Pain Intervention(s): Repositioned, Monitored during session    Home Living Family/patient expects to be discharged to:: Private residence Living Arrangements: Spouse/significant other Available Help at Discharge: Family;Available 24 hours/day Type of Home: House Home Access: Stairs to enter Entrance Stairs-Rails: Left Entrance Stairs-Number of Steps:  3-4   Home Layout: Able to live on main level with bedroom/bathroom Home Equipment: Rolling Walker (2 wheels);Cane - single point      Prior Function Prior Level of Function : Needs assist             Mobility Comments: typically pt able to ambulate without assist, history of falls ADLs Comments: Spouse provides assist as needed with ADL, iADL, community mobility and medication management.     Extremity/Trunk Assessment        Lower Extremity  Assessment Lower Extremity Assessment: Generalized weakness    Cervical / Trunk Assessment Cervical / Trunk Assessment: Normal  Communication   Communication Communication: No apparent difficulties  Cognition Arousal: Alert Behavior During Therapy: Flat affect Overall Cognitive Status: Within Functional Limits for tasks assessed                                          General Comments      Exercises     Assessment/Plan    PT Assessment Patient needs continued PT services  PT Problem List Decreased strength;Decreased activity tolerance;Decreased mobility       PT Treatment Interventions Gait training;DME instruction;Balance training;Functional mobility training;Therapeutic activities;Therapeutic exercise;Manual techniques;Stair training    PT Goals (Current goals can be found in the Care Plan section)  Acute Rehab PT Goals PT Goal Formulation: With patient Time For Goal Achievement: 01/30/23 Potential to Achieve Goals: Good    Frequency Min 1X/week     Co-evaluation               AM-PAC PT "6 Clicks" Mobility  Outcome Measure Help needed turning from your back to your side while in a flat bed without using bedrails?: A Little Help needed moving from lying on your back to sitting on the side of a flat bed without using bedrails?: A Little Help needed moving to and from a bed to a chair (including a wheelchair)?: A Little Help needed standing up from a chair using your arms (e.g., wheelchair or bedside chair)?: A Little Help needed to walk in hospital room?: A Little Help needed climbing 3-5 steps with a railing? : A Lot 6 Click Score: 17    End of Session Equipment Utilized During Treatment: Gait belt;Oxygen Activity Tolerance: Patient tolerated treatment well Patient left: in chair;with call bell/phone within reach;with chair alarm set;with family/visitor present Nurse Communication: Mobility status PT Visit Diagnosis: Difficulty in  walking, not elsewhere classified (R26.2)    Time: 1610-9604 PT Time Calculation (min) (ACUTE ONLY): 21 min   Charges:   PT Evaluation $PT Eval Low Complexity: 1 Low   PT General Charges $$ ACUTE PT VISIT: 1 Visit       Kati PT, DPT Physical Therapist Acute Rehabilitation Services Office: [phone]   Janan Halter [place] 01/16/2023, 2:53 PM

## 2023-01-16 NOTE — Plan of Care (Signed)
Plan of care and goals discussed with patient, time given for questions, patient handbook/guide at bedside.  Problem: Education: Goal: Ability to describe self-care measures that may prevent or decrease complications (Diabetes Survival Skills Education) will improve 01/16/2023 2246 by [employer], RN Outcome: Progressing 01/16/2023 2246 by [employer], RN Outcome: Progressing Goal: Individualized Educational Video(s) 01/16/2023 2246 by [employer], RN Outcome: Progressing 01/16/2023 2246 by [employer], RN Outcome: Progressing   Problem: Coping: Goal: Ability to adjust to condition or change in health will improve 01/16/2023 2246 by [employer], RN Outcome: Progressing 01/16/2023 2246 by [employer], RN Outcome: Progressing   Problem: Fluid Volume: Goal: Ability to maintain a balanced intake and output will improve 01/16/2023 2246 by [employer], RN Outcome: Progressing 01/16/2023 2246 by [employer], RN Outcome: Progressing   Problem: Health Behavior/Discharge Planning: Goal: Ability to identify and utilize available resources and services will improve 01/16/2023 2246 by [employer], RN Outcome: Progressing 01/16/2023 2246 by [employer], RN Outcome: Progressing Goal: Ability to manage health-related needs will improve 01/16/2023 2246 by [employer], RN Outcome: Progressing 01/16/2023 2246 by [employer], RN Outcome: Progressing   Problem: Metabolic: Goal: Ability to maintain appropriate glucose levels will improve 01/16/2023 2246 by [employer], RN Outcome: Progressing 01/16/2023 2246 by [employer], RN Outcome: Progressing   Problem: Nutritional: Goal: Maintenance of adequate nutrition will improve 01/16/2023 2246 by [employer], RN Outcome: Progressing 01/16/2023 2246 by [employer], RN Outcome: Progressing Goal: Progress toward achieving an optimal weight will improve 01/16/2023 2246 by  [employer], RN Outcome: Progressing 01/16/2023 2246 by [employer], RN Outcome: Progressing   Problem: Skin Integrity: Goal: Risk for impaired skin integrity will decrease 01/16/2023 2246 by [employer], RN Outcome: Progressing 01/16/2023 2246 by Just[employer], RN Outcome: Progressing   Problem: Tissue Perfusion: Goal: Adequacy of tissue perfusion will improve 01/16/2023 2246 by [employer], RN Outcome: Progressing 01/16/2023 2246 by Just[employer], RN Outcome: Progressing   Problem: Education: Goal: Knowledge of General Education information will improve Description: Including pain rating scale, medication(s)/side effects and non-pharmacologic comfort measures 01/16/2023 2246 by [employer], RN Outcome: Progressing 01/16/2023 2246 by [employer], RN Outcome: Progressing   Problem: Health Behavior/Discharge Planning: Goal: Ability to manage health-related needs will improve 01/16/2023 2246 by Justus [employer], RN Outcome: Progressing 01/16/2023 2246 by Justus [employer], RN Outcome: Progressing   Problem: Clinical Measurements: Goal: Ability to maintain clinical measurements within normal limits will improve 01/16/2023 2246 by Just[employer], RN Outcome: Progressing 01/16/2023 2246 by Just[employer], RN Outcome: Progressing Goal: Will remain free from infection 01/16/2023 2246 by [employer], RN Outcome: Progressing 01/16/2023 2246 by [employer], RN Outcome: Progressing Goal: Diagnostic test results will improve 01/16/2023 2246 by [employer], RN Outcome: Progressing 01/16/2023 2246 by Just[employer], RN Outcome: Progressing Goal: Respiratory complications will improve 01/16/2023 2246 by Just[employer], RN Outcome: Progressing 01/16/2023 2246 by Just[employer], RN Outcome: Progressing Goal: Cardiovascular complication will be avoided 01/16/2023 2246 by [employer], RN Outcome: Progressing 01/16/2023 2246 by  [employer], RN Outcome: Progressing   Problem: Activity: Goal: Risk for activity intolerance will decrease 01/16/2023 2246 by [employer], RN Outcome: Progressing 01/16/2023 2246 by Just[employer], RN Outcome: Progressing   Problem: Nutrition: Goal: Adequate nutrition will be maintained 01/16/2023 2246  by [employer], RN Outcome: Progressing 01/16/2023 2246 by Just[employer], RN Outcome: Progressing   Problem: Coping: Goal: Level of anxiety will decrease 01/16/2023 2246 by Just[employer], RN Outcome: Progressing 01/16/2023 2246 by Just[employer], RN Outcome: Progressing   Problem: Elimination: Goal: Will not experience complications related to bowel motility 01/16/2023 2246 by [employer], RN Outcome: Progressing 01/16/2023 2246 by Just[employer], RN Outcome: Progressing Goal: Will not experience complications related to urinary retention 01/16/2023 2246 by Just[employer], RN Outcome: Progressing 01/16/2023 2246 by Just[employer], RN Outcome: Progressing   Problem: Pain Managment: Goal: General experience of comfort will improve 01/16/2023 2246 by Just[employer], RN Outcome: Progressing 01/16/2023 2246 by Just[employer], RN Outcome: Progressing   Problem: Safety: Goal: Ability to remain free from injury will improve 01/16/2023 2246 by Justus [employer], RN Outcome: Progressing 01/16/2023 2246 by Just[employer], RN Outcome: Progressing   Problem: Skin Integrity: Goal: Risk for impaired skin integrity will decrease 01/16/2023 2246 by [employer], RN Outcome: Progressing 01/16/2023 2246 by [employer], RN Outcome: Progressing

## 2023-01-16 NOTE — Progress Notes (Signed)
Patient being transfer to 4 East room 1405 via wheelchair, patient belongings with patient, vitals stable at time of transfer, no skin issues noted, IV's patent and saline locked, report given to 4 east nurse [place] with time given for questions.

## 2023-01-16 NOTE — Progress Notes (Signed)
OT Cancellation Note  Patient Details Name: Amber Stephenson MRN: [id number] DOB: February 16, 1946   Cancelled Treatment:    Reason Eval/Treat Not Completed: OT screened, no needs identified, will sign off.  OT orders received and chart reviewed. Spoke with pt and husband in room this afternoon to discuss pt's level of assist and to confirm husband's ability to provide assistance. Husband assists with ADL and IADL tasks at home when needed and has no concerns continuing at home. Husband reports they have all the necessary DME at home. No OT needs identified. OT will sign off. Thank you for the referral.   Limmie Patricia, OTR/L,CBIS  Supplemental OT - [facility] Secure Chat Preferred   01/16/2023, 3:29 PM

## 2023-01-16 NOTE — Inpatient Diabetes Management (Signed)
Inpatient Diabetes Program Recommendations  AACE/ADA: New Consensus Statement on Inpatient Glycemic Control (2015)  Target Ranges:  Prepandial:   less than 140 mg/dL      Peak postprandial:   less than 180 mg/dL (1-2 hours)      Critically ill patients:  140 - 180 mg/dL   Lab Results  Component Value Date   GLUCAP 356 (H) 01/16/2023   HGBA1C 11.3 (H) 01/13/2023    Review of Glycemic Control  Diabetes history: DM2 Outpatient Diabetes medications: Lantus 35 units BID, Humalog 6 units TID with meals, metformin 1000 mg with breakfast Current orders for Inpatient glycemic control: Semglee 20 unis BID, Novolog 0-20 TID with meals and 0-5 HS  HgbA1C - 11.3% Post-prandials elevated. FBS acceptable. May benefit from meal coverage insulin Eating 100%  Inpatient Diabetes Program Recommendations:    Consider adding Novolog 4 units TID with meals if eating > 50%  Continue to follow.  Thank you. Ailene Ards, RD, LDN, CDCES Inpatient Diabetes Coordinator [phone]

## 2023-01-16 NOTE — Plan of Care (Signed)
  Problem: Coping: Goal: Ability to adjust to condition or change in health will improve Outcome: Progressing   Problem: Fluid Volume: Goal: Ability to maintain a balanced intake and output will improve Outcome: Progressing   Problem: Health Behavior/Discharge Planning: Goal: Ability to identify and utilize available resources and services will improve Outcome: Progressing Goal: Ability to manage health-related needs will improve Outcome: Progressing   Problem: Metabolic: Goal: Ability to maintain appropriate glucose levels will improve Outcome: Progressing   

## 2023-01-16 NOTE — Progress Notes (Signed)
Patient Name: Amber Stephenson Date of Encounter: 01/16/2023 [facility] Cardiologist: Armanda Magic, MD   Interval Summary  .    Breathing continues to improve.  HR controlled in afib  Vital Signs .    Vitals:   01/16/23 0400 01/16/23 0500 01/16/23 0600 01/16/23 0755  BP: 115/69  (!) 151/105   Pulse: 77  93   Resp: 17  20   Temp:    98 F (36.7 C)  TempSrc:    Oral  SpO2: 98%  96%   Weight:  85.7 kg    Height:        Intake/Output Summary (Last 24 hours) at 01/16/2023 0831 Last data filed at 01/16/2023 0600 Gross per 24 hour  Intake 1684 ml  Output 300 ml  Net 1384 ml      01/16/2023    5:00 AM 01/15/2023    7:52 AM 01/13/2023    4:57 AM  Last 3 Weights  Weight (lbs) 188 lb 15 oz 186 lb 4.6 oz 193 lb 5.5 oz  Weight (kg) 85.7 kg 84.5 kg 87.7 kg      Telemetry/ECG    Atrial fibrillation with CVR- Personally Reviewed  Physical Exam .   GEN: Well nourished, well developed in no acute distress HEENT: Normal NECK: No JVD; No carotid bruits LYMPHATICS: No lymphadenopathy CARDIAC:irregularly irregular, no murmurs, rubs, gallops RESPIRATORY:  crackles at bases bilaterally ABDOMEN: Soft, non-tender, non-distended MUSCULOSKELETAL:  No edema; No deformity  SKIN: Warm and dry NEUROLOGIC:  Alert and oriented x 3 PSYCHIATRIC:  Normal affect  Assessment & Plan .     Nonobstructive CAD  Elevated Troponin  - Patient previously had cardiac catheterization in 2019 that showed nonbstructive CAD  - Patient presented for elective CT- developed sudden onset of resting dypsnea. Went into afib with RVR. Found to have SBP in the 190s. No chest pain  - hsTn [phone] - Likely demand ischemia in the setting of acute hypoxemia, acute CHF, HTN urgency, afib with RVR, AKI  - Patient has not had angina at home- reassuring.  - Echocardiogram this admission showed EF 60-65%, no regional wall motion abnormalities, mild LVH, normal RV function - Patient did have an episode  of retrosternal chest pain that radiated to the right side a few nights ago. Pain improved after SL nitroglycerin and fentanyl. Per notes overnight, pain was reproducible on palpation. hsTn 73>64 (downtrending from 800s on 8/13). No further CP - No plans for ischemic workup this admission. Can consider outpatient stress test  - Patient now off IV heparin and back on eliquis  - Continue Lopressor 50mg  BID and Atorvastatin 40mg  daily - no ASA due to DOAC   Acute on Chronic Diastolic Heart Failure  Acute hypoxemic respiratory failure  - Most recent echocardiogram from 11/2022 showed EF 65-70%, no regional wall motion abnormalities, mild LVH - Presented complaining of increasing shortness of breath  - CT chest 8/13 showed perihilar ground-glass infiltrates in the lungs, likely representing edema or multifocal pneumonia. Note, WBC have been elevated but procalcitonin normal  - Suspect patient developed flash pulmonary edema in the setting of HTN urgency and Afib with RVR.  - Echo this admission showed EF 60 to 65% with no wall motion abnormalities and mild LVH  - She has been on IV lasix but I&O's are incomplete. Weight down 5 lbs since admission but up 2lbs from yesterday. Renal function stable and creatinine is at baseline (0.70). BNP has improved from 446 on admission to  154 yesterday. Cxray yesterday with left basilar opacity - still has crackles at both bases so will given another day of IV diuretics - Discussed starting jardiance or farxiga- unfortunately, patient had previously been on jardiance for diabetes and developed yeast infections. Hold off on SGLT2i    Permanent atrial Fibrillation  - Patient initially presented in RVR with HR elevated to the 150s. She was started on dilt gtt with improvement. Now off dilt  - HR controlled on BB - continue Lopressor 50mg  BID and Eliquis 5mg  BID   HLD  - Lipid panel this admission showed LDL 141  - with known CAD LDL goal <70 - Continue lipitor 40  mg daily (new this admission)  - Needs LFTs and lipid panel in 8 weeks    Otherwise per primary  - Leukocytosis  - Type 2 DM, poorly controlled - Endometrial Cancer  - AKI  - Hypoalbuminemia  - Anxiety   For questions or updates, please contact [facility] Please consult [email or web address] for contact info under    Total time spent with patient today 35 minutes. This includes reviewing records, evaluating the patient and coordinating care. Face-to-face time >50%.    Signed, Armanda Magic, MD

## 2023-01-16 NOTE — Progress Notes (Signed)
PROGRESS NOTE    [name]  MWN:[id number] DOB: [birth year] DOA: 01/12/2023 PCP: Mechele Claude, MD   Brief Narrative: Amber Stephenson is a 77 y.o. female with a history of chronic diastolic heart failure, chronic atrial fibrillation on Eliquis, chronic respite failure with hypoxia on 2 L/min of oxygen, insulin-dependent type 2 diabetes, primary hypertension, hyperlipidemia, bipolar 1 disorder, endometrial cancer.  Patient presented secondary to development of acute respiratory distress while attempting to lay flat for CT scan and was found to have evidence of likely flash pulmonary edema from atrial fibrillation with RVR. Cardiology consulted. Patient started on diuresis and management for acute heart failure.   Assessment and Plan:  Acute on chronic respiratory failure with hypoxia Secondary to flash pulmonary edema in relation to atrial fibrillation with RVR with development of acute on chronic heart failure.  It is possible that it could be an component of multifocal pneumonia as seen on CT imaging.  Patient with documented hypoxia of SpO2 down to 72% on 4 L/minute of oxygen supplementation requiring BiPAP for management. -Continue BiPAP as needed for respiratory distress -Wean to home 2 L/min of oxygen as able  Acute on chronic diastolic heart failure In setting of atrial fibrillation with rapid ventricular rate.  Echocardiogram this admission was significant for an LVEF of 60 to 65% with indeterminant diastolic function.  Troponin elevation with a peak of 852.  Cardiology consulted and patient was started on Lasix IV diuresis.  Urine output of only 700 mL over the last 24 hours however there were 3 unmeasured urine current status.  Weight of 87.7 kg on admission and no weight available today. -Cardiology recommendations: Lasix IV -Daily weights, strict ins and out  Demand ischemia Peak troponin of 852.  Per cardiology likely demand ischemia in setting of atrial fibrillation  with RVR in addition to acute heart failure.  Recommendation for outpatient stress test versus PET.  Recommendation to continue heparin IV for now.  Permanent atrial fibrillation with RVR Patient is managed on metoprolol 50 mg twice daily and Eliquis as an outpatient.  Patient presented with RVR with with heart rates into the 150s.  She was started on diltiazem drip with resultant hypotension.  Diltiazem discontinued with continued stable heart rates. -Cardiology recommendations: Continue metoprolol, Eliquis  Hypotension Secondary to medication effect of diltiazem infusion in addition to Lasix IV diuresis.  Diltiazem IV infusion discontinued with resolution of hypotension.  AKI Baseline creatinine is about 0.8-0.9.  Creatinine of 1.21 on admission likely related to fluid overload.  Creatinine down to 0.94 with IV diuresis.  Dependent diabetes mellitus type 2 Uncontrolled with hemoglobin A1c of 11.3%.  Patient is on insulin glargine 35 units twice daily in addition to insulin lispro 3 times daily via sliding scale, metformin. -Increase to insulin glargine 30 units twice daily and sliding scale insulin -Hold metformin  High anion gap metabolic acidosis Likely related to lactic acidosis and possibly contributed to by acute kidney injury.  Anion gap closed and acidosis has improved slightly.  Chronic pain -Continue MS Contin 15 mg twice daily  Hyponatremia Appears to be a component of pseudohyponatremia secondary to hyperglycemia.  Corrected sodium of 132 from BMP yesterday. -Follow-up BMP  Chest pain Episode overnight. Right-sided pressure-like. Chest x-ray obtained with mild patchiness. Troponin obtained as well and was elevated but significantly down from prior. Pain did improve with nitroglycerin, however. Cardiology is following.  Hypoalbuminemia Mild.  Nonobstructive CAD Noted.  Patient with elevated troponin this admission as mentioned above,  likely related to demand  ischemia. -Continue aspirin and Lipitor  Hyperlipidemia -Continue Lipitor  Anxiety -Continue Ativan 0.5 mg 3 times daily -Hydroxyzine 25 mg as needed  Obesity Estimated body mass index is 34.56 kg/m as calculated from the following:   Height as of this encounter: 5\' 2"  (1.575 m).   Weight as of this encounter: 85.7 kg.   DVT prophylaxis: Heparin IV Code Status:   Code Status: Full Code Family Communication: None at bedside Disposition Plan: Transfer out of ICU/stepdown.  Discharge home pending ability to remain stable off of BiPAP in addition to completing IV Lasix diuresis per Cardiology recommendations, likely in 24 hours. PT/OT recommending home health.   Consultants:  Cardiology  Procedures:  8/14: Transthoracic Echocardiogram  Antimicrobials: None    Subjective: Breathing well. No chest pain or dyspnea at this time.  Objective: BP 111/60   Pulse 77   Temp 98.1 F (36.7 C) (Oral)   Resp (!) 22   Ht 5\' 2"  (1.575 m)   Wt 85.7 kg   SpO2 95%   BMI 34.56 kg/m   Examination:  General exam: Appears calm and comfortable Respiratory system: Clear to auscultation. Respiratory effort normal. Cardiovascular system: S1 & S2 heard, RRR. No murmurs. Gastrointestinal system: Abdomen is nondistended, soft and nontender. Normal bowel sounds heard. Central nervous system: Alert and oriented. No focal neurological deficits.   Data Reviewed: I have personally reviewed following labs and imaging studies  CBC Lab Results  Component Value Date   WBC 11.4 (H) 01/16/2023   RBC 4.38 01/16/2023   HGB 12.6 01/16/2023   HCT 39.1 01/16/2023   MCV 89.3 01/16/2023   MCH 28.8 01/16/2023   PLT 233 01/16/2023   MCHC 32.2 01/16/2023   RDW 15.0 01/16/2023   LYMPHSABS 2.7 12/25/2022   MONOABS 0.6 11/14/2022   EOSABS 0.2 12/25/2022   BASOSABS 0.0 12/25/2022     Last metabolic panel Lab Results  Component Value Date   NA 131 (L) 01/16/2023   K 4.6 01/16/2023   CL 96 (L)  01/16/2023   CO2 29 01/16/2023   BUN 34 (H) 01/16/2023   CREATININE 0.94 01/16/2023   GLUCOSE 189 (H) 01/16/2023   GFRNONAA >60 01/16/2023   GFRAA 71 06/13/2020   CALCIUM 8.8 (L) 01/16/2023   PHOS 3.1 01/13/2023   PROT 6.6 01/13/2023   ALBUMIN 3.3 (L) 01/13/2023   LABGLOB 2.8 11/26/2022   AGRATIO 1.4 09/16/2022   BILITOT 0.5 01/13/2023   ALKPHOS 53 01/13/2023   AST 19 01/13/2023   ALT 16 01/13/2023   ANIONGAP 6 01/16/2023    GFR: Estimated Creatinine Clearance: 52.5 mL/min (by C-G formula based on SCr of 0.94 mg/dL).  Recent Results (from the past 240 hour(s))  MRSA Next Gen by PCR, Nasal     Status: None   Collection Time: 01/12/23  9:43 PM   Specimen: Nasal Mucosa; Nasal Swab  Result Value Ref Range Status   MRSA by PCR Next Gen NOT DETECTED NOT DETECTED Final    Comment: (NOTE) The GeneXpert MRSA Assay (FDA approved for NASAL specimens only), is one component of a comprehensive MRSA colonization surveillance program. It is not intended to diagnose MRSA infection nor to guide or monitor treatment for MRSA infections. Test performance is not FDA approved in patients less than 72 years old. Performed at [facility], [address] [place], [place] [address]       Radiology Studies: [facility] Chest Port 1 View  Result Date: 01/15/2023 CLINICAL  DATA:  Chest pain EXAM: PORTABLE CHEST 1 VIEW COMPARISON:  01/12/2023 FINDINGS: Cardiac shadow is enlarged but stable. Lungs are well aerated bilaterally. Left basilar airspace opacity is noted. No other focal abnormality is noted. IMPRESSION: Left basilar airspace opacity. Electronically Signed   By: Alcide Clever M.D.   On: 01/15/2023 03:58      LOS: 4 days    Jacquelin Hawking, MD [facility] 01/16/2023, 3:58 PM   If 7PM-7AM, please contact night-coverage [email or web address]

## 2023-01-16 NOTE — Progress Notes (Deleted)
Virtual Visit via Telephone Note   Because of Amber Stephenson's co-morbid illnesses, she is at least at moderate risk for complications without adequate follow up.  This format is felt to be most appropriate for this patient at this time.  The patient did not have access to video technology/had technical difficulties with video requiring transitioning to audio format only (telephone).  All issues noted in this document were discussed and addressed.  No physical exam could be performed with this format.  Please refer to the patient's chart for her consent to telehealth for Amber Stephenson.  Evaluation Performed:  Preoperative cardiovascular risk assessment _____________   Date:  01/16/2023   Patient ID:  Amber Stephenson, DOB June 24, 1945, MRN [id number] Patient Location:  Home Provider location:   Office  Primary Care Provider:  Mechele Claude, MD Primary Cardiologist:  Armanda Magic, MD  Chief Complaint / Patient Profile   77 y.o. y/o female with a h/o *** who is pending *** and presents today for telephonic preoperative cardiovascular risk assessment.  History of Present Illness    Amber Stephenson is a 77 y.o. female who presents via audio/video conferencing for a telehealth visit today.  Pt was last seen in cardiology clinic on *** by ***.  At that time Amber Stephenson was doing well ***.  The patient is now pending procedure as outlined above. 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   Benign paroxysmal positional vertigo due to bilateral vestibular disorder  05/20/2019   Bipolar 1 disorder (HCC) 01/23/2015   with GAD, benzo dependence   BPPV (benign paroxysmal positional vertigo) 05/20/2019   CAD (coronary artery disease)    Nonobstructive; Managed by Dr. Purvis Sheffield   Cardiomegaly 01/12/2018   CHF (congestive heart failure) 06/08/2022   Chronic back pain 01/04/2015   Chronic constipation 04/25/2020   Chronic diastolic heart failure (HCC) 05/30/2015   Chronic pain syndrome 08/22/2019   back pain, sacroiliitis   Chronic pain syndrome 08/22/2019   Chronic post-traumatic stress disorder (PTSD) 12/06/2020   Diabetic neuropathy (HCC) 02/06/2016   Dyslipidemia 09/24/2020   Dyspnea on exertion    Essential hypertension    Family history of coronary arteriosclerosis 05/30/2015   Functional diarrhea 10/26/2020   Head trauma 09/17/2020   Hemorrhoids 03/11/2022   Herpes genitalis in women 07/16/2015   History of adenomatous polyp of colon 05/21/2019   Overview:   03/31/17: Colonoscopy: nonadvanced adenoma, microscopic colitis, f/u 5 yrs, [facility]/GAP   Insomnia 01/23/2015   Lipoma 02/08/2015   Migraine headache with aura 02/12/2016   Myofascial pain dysfunction syndrome 08/22/2019   Myofascial pain dysfunction syndrome 08/22/2019   Non-alcoholic fatty liver disease 01/12/2018   Opioid dependence (HCC) 12/06/2020   OSA (obstructive sleep apnea) 02/24/2019   10/09/2018 - HST  - AHI 40.6    Osteopenia 12/06/2020   Rx alendronate 35 mg.   Pinguecula 12/06/2020   Postcoital bleeding 12/06/2020   Presbyopia 12/06/2020   Pulmonary hypertension    RLS (restless legs syndrome) 04/27/2015   Superficial fungus infection of skin 09/03/2021   Tremor, essential 12/11/2021   Type  II diabetes mellitus (HCC) 09/24/2020   Past Surgical History:  Procedure Laterality Date   BREAST REDUCTION SURGERY     EYE SURGERY Right    cateracts   HAMMER TOE SURGERY     LEFT HEART CATH AND CORONARY ANGIOGRAPHY N/A 02/03/2018   Procedure: LEFT HEART CATH AND CORONARY  ANGIOGRAPHY;  Surgeon: [name], Peter M, MD;  Location: [facility] INVASIVE CV LAB;  Service: Cardiovascular;  Laterality: N/A;   REVERSE SHOULDER ARTHROPLASTY Right 08/19/2019   Procedure: REVERSE SHOULDER ARTHROPLASTY;  Surgeon: Beverely Low, MD;  Location: [facility] ORS;  Service: Orthopedics;  Laterality: Right;  interscalene block   SHOULDER SURGERY Right    "I BROKE MY SHOUDLER   THIGH SURGERY     "TO REMOVE A TUMOR "    Allergies  Allergies  Allergen Reactions   Iodine Anaphylaxis   Ivp Dye [Iodinated Contrast Media] Anaphylaxis and Swelling    Throat closes    Latex Other (See Comments)    Latex tape pulls skin with it   Tizanidine Other (See Comments)    Weakness, goofy, bad dreams    Home Medications    Prior to Admission medications   Medication Sig Start Date End Date Taking? Authorizing Provider  Acetaminophen (TYLENOL PO) Take 2 tablets by mouth as needed (pain/headache).    [provider]  albuterol (VENTOLIN HFA) 108 (90 Base) MCG/ACT inhaler Inhale 2 puffs into the lungs every 6 (six) hours as needed for wheezing or shortness of breath. 08/09/20   Sharee Holster, NP  allopurinol (ZYLOPRIM) 100 MG tablet Take 100 mg by mouth daily. 10/22/22   [provider]  apixaban (ELIQUIS) 5 MG TABS tablet Take 1 tablet (5 mg total) by mouth 2 (two) times daily. 09/09/22   Quintella Reichert, MD  ARIPiprazole (ABILIFY) 2 MG tablet Take 2 mg by mouth at bedtime. 10/07/21   [provider]  BELSOMRA 20 MG TABS Take 1 tablet by mouth at bedtime as needed.    [provider]  Continuous Blood Gluc Sensor (DEXCOM G7 SENSOR) MISC Change sensor every 10 days Patient taking differently: 1 each by Other route See admin instructions. Change sensor every 10 days 05/20/22   Shamleffer, Konrad Dolores, MD  cyanocobalamin (VITAMIN B12) 1000 MCG tablet Take 1 tablet (1,000 mcg total) by mouth daily. 09/04/22   Ghimire, Werner Lean, MD  denosumab (PROLIA) 60 MG/ML SOSY injection  Inject 60 mg into the skin every 6 (six) months. 08/14/20   Mechele Claude, MD  diphenhydrAMINE (BENADRYL) 50 MG tablet Take 1 tablet (50 mg total) by mouth once for 1 dose. Take 1 hour prior to CT scan 01/05/23 01/05/23  [name], MD  doxepin (SINEQUAN) 10 MG capsule Take 10 mg by mouth at bedtime. 11/27/22   [provider]  escitalopram (LEXAPRO) 10 MG tablet Take 10 mg by mouth daily.    [provider]  fluticasone-salmeterol (ADVAIR DISKUS) 100-50 MCG/ACT AEPB Inhale 1 puff into the lungs 2 (two) times daily. 09/11/22   Parrett, Virgel Bouquet, NP  Galcanezumab-gnlm (EMGALITY) 120 MG/ML SOAJ INJECT ONCE A MONTH Patient taking differently: Inject 120 mg into the skin See admin instructions. INJECT ONCE A MONTH 10/31/22   Marcos Eke, PA-C  hydrochlorothiazide (MICROZIDE) 12.5 MG capsule Take 1 capsule (12.5 mg total) by mouth daily. 12/08/22   Mechele Claude, MD  insulin glargine (LANTUS SOLOSTAR) 100 UNIT/ML Solostar Pen Inject 35 Units into the skin 2 (two) times daily. 12/25/22   Mechele Claude,  MD  insulin lispro (HUMALOG KWIKPEN) 100 UNIT/ML KwikPen Max daily 50 units Patient taking differently: Inject 4-8 Units into the skin 3 (three) times daily. 05/28/22   Shamleffer, Konrad Dolores, MD  Insulin Pen Needle 29G X MISC 1 Device by Does not apply route daily in the afternoon. 05/28/22   Shamleffer, Konrad Dolores, MD  lisinopril (ZESTRIL) 10 MG tablet Take 1 tablet (10 mg total) by mouth daily. 12/08/22   Mechele Claude, MD  LORazepam (ATIVAN) 1 MG tablet Take 0.5 tablets (0.5 mg total) by mouth 2 (two) times daily. 09/04/22   Ghimire, Werner Lean, MD  metFORMIN (GLUCOPHAGE-XR) 500 MG 24 hr tablet Take 2 tablets (1,000 mg total) by mouth daily with breakfast. 12/08/22   Shamleffer, Konrad Dolores, MD  metoprolol tartrate (LOPRESSOR) 50 MG tablet Take 1 tablet (50 mg total) by mouth 2 (two) times daily. 01/06/23   Quintella Reichert, MD  morphine (MS CONTIN) 15 MG 12 hr tablet Take 1  tablet (15 mg total) by mouth every 12 (twelve) hours. For chronic pain- can refill by 01/13/23 12/22/22   Genice Rouge, MD  naloxone [facility]) nasal spray 4 mg/0.1 mL Place 1 spray into the nose once. 08/08/21   [provider]  norethindrone (AYGESTIN) 5 MG tablet Take 2 tablets (10 mg total) by mouth daily. 12/30/22 01/29/23  [name], DO  nystatin (MYCOSTATIN/NYSTOP) powder Apply 1 Application topically as needed (rash). 06/16/22   Shamleffer, Konrad Dolores, MD  nystatin ointment (MYCOSTATIN) Apply 1 Application topically 2 (two) times daily. Patient taking differently: Apply 1 Application topically as needed (rash). 12/27/21   Adline Potter, NP  ondansetron (ZOFRAN-ODT) 8 MG disintegrating tablet Take 1 tablet (8 mg total) by mouth every 6 (six) hours as needed for nausea or vomiting. 01/13/22   Mechele Claude, MD  OXcarbazepine (TRILEPTAL) 150 MG tablet Take 1 tablet (150 mg total) by mouth 2 (two) times daily. 06/13/22   Marguerita Merles Latif, DO  predniSONE (DELTASONE) 50 MG tablet Take one tablet at 13, 7 and 1 hour prior to CT scan. 01/05/23   [name], MD  pregabalin (LYRICA) 200 MG capsule Take 200 mg by mouth daily.    [provider]  pregabalin (LYRICA) 50 MG capsule Take 1 capsule (50 mg total) by mouth at bedtime. Patient not taking: Reported on 01/12/2023 12/25/22   Mechele Claude, MD  senna-docusate (SENOKOT-S) 8.6-50 MG tablet Take 1 tablet by mouth at bedtime. Patient taking differently: Take 1 tablet by mouth as needed for moderate constipation. 06/13/22   Sheikh, Omair Latif, DO  tirzepatide [facility]) 5 MG/0.5ML Pen Inject 5 mg into the skin once a week. 11/11/22   Shamleffer, Konrad Dolores, MD  Vitamin D, Cholecalciferol, 25 MCG (1000 UT) TABS Take 1,000 Units by mouth daily in the afternoon. 07/26/20   [provider]    Physical Exam    Vital Signs:  Amber Stephenson does not have vital signs available for review today.***  Given  telephonic nature of communication, physical exam is limited. AAOx3. NAD. Normal affect.  Speech and respirations are unlabored.  Accessory Clinical Findings    None  Assessment & Plan    1.  Preoperative Cardiovascular Risk Assessment:      Primary Cardiologist: Armanda Magic, MD  Chart reviewed as part of pre-operative protocol coverage. Given past medical history and time since last visit, based on ACC/AHA guidelines, Amber Stephenson would be at acceptable risk for the planned procedure without further cardiovascular testing.  Patient was advised that if she*** develops new symptoms prior to surgery to contact our office to arrange a follow-up appointment.  He verbalized understanding.  Patient with diagnosis of afib on Eliquis for anticoagulation.     Procedure: Surgical Staging, including total hysterectomy, bilateral salpingo-ophonectomy and lymph node assessment    Date of procedure: TBD   CHA2DS2-VASc Score = 7  This indicates a 11.2% annual risk of stroke. The patient's score is based upon: CHF History: 1 HTN History: 1 Diabetes History: 1 Stroke History: 0 Vascular Disease History: 1 Age Score: 2 Gender Score: 1   CrCl 22mL/min using adjusted body weight Platelet count 215K   Per office protocol, patient can hold Eliquis for 2-3 days prior to procedure.  I will route this recommendation to the requesting party via Epic fax function and remove from pre-op pool.       Time:   Today, I have spent *** minutes with the patient with telehealth technology discussing medical history, symptoms, and management plan.     Ronney Asters, NP  01/16/2023, 6:57 AM

## 2023-01-17 DIAGNOSIS — I5033 Acute on chronic diastolic (congestive) heart failure: Secondary | ICD-10-CM | POA: Diagnosis not present

## 2023-01-17 DIAGNOSIS — I482 Chronic atrial fibrillation, unspecified: Secondary | ICD-10-CM | POA: Diagnosis not present

## 2023-01-17 DIAGNOSIS — J9621 Acute and chronic respiratory failure with hypoxia: Secondary | ICD-10-CM | POA: Diagnosis not present

## 2023-01-17 LAB — CBC
HCT: 40.7 % (ref 36.0–46.0)
Hemoglobin: 13 g/dL (ref 12.0–15.0)
MCH: 29 pg (ref 26.0–34.0)
MCHC: 31.9 g/dL (ref 30.0–36.0)
MCV: 90.8 fL (ref 80.0–100.0)
Platelets: 235 10*3/uL (ref 150–400)
RBC: 4.48 MIL/uL (ref 3.87–5.11)
RDW: 14.7 % (ref 11.5–15.5)
WBC: 12.4 10*3/uL — ABNORMAL HIGH (ref 4.0–10.5)
nRBC: 0 % (ref 0.0–0.2)

## 2023-01-17 LAB — BASIC METABOLIC PANEL
Anion gap: 10 (ref 5–15)
BUN: 32 mg/dL — ABNORMAL HIGH (ref 8–23)
CO2: 27 mmol/L (ref 22–32)
Calcium: 8.9 mg/dL (ref 8.9–10.3)
Chloride: 96 mmol/L — ABNORMAL LOW (ref 98–111)
Creatinine, Ser: 0.93 mg/dL (ref 0.44–1.00)
GFR, Estimated: >60 mL/min (ref >60)
Glucose, Bld: 175 mg/dL — ABNORMAL HIGH (ref 70–99)
Potassium: 4.5 mmol/L (ref 3.5–5.1)
Sodium: 133 mmol/L — ABNORMAL LOW (ref 135–145)

## 2023-01-17 LAB — GLUCOSE, CAPILLARY
Glucose-Capillary: 189 mg/dL — ABNORMAL HIGH (ref 70–99)
Glucose-Capillary: 283 mg/dL — ABNORMAL HIGH (ref 70–99)
Glucose-Capillary: 263 mg/dL — ABNORMAL HIGH (ref 70–99)
Glucose-Capillary: 352 mg/dL — ABNORMAL HIGH (ref 70–99)

## 2023-01-17 MED ORDER — FUROSEMIDE 10 MG/ML IJ SOLN
60.0000 mg | Freq: Two times a day (BID) | INTRAMUSCULAR | Status: AC
  Administered 2023-01-17 (×2): 60 mg via INTRAVENOUS
  Filled 2023-01-17 (×2): qty 6

## 2023-01-17 NOTE — Progress Notes (Signed)
PROGRESS NOTE    [name]  IEP:[id number] DOB: [birth year] DOA: 01/12/2023 PCP: Mechele Claude, MD   Brief Narrative: Amber Stephenson is a 77 y.o. female with a history of chronic diastolic heart failure, chronic atrial fibrillation on Eliquis, chronic respite failure with hypoxia on 2 L/min of oxygen, insulin-dependent type 2 diabetes, primary hypertension, hyperlipidemia, bipolar 1 disorder, endometrial cancer.  Patient presented secondary to development of acute respiratory distress while attempting to lay flat for CT scan and was found to have evidence of likely flash pulmonary edema from atrial fibrillation with RVR. Cardiology consulted. Patient started on diuresis and management for acute heart failure.   Assessment and Plan:  Acute on chronic respiratory failure with hypoxia Secondary to flash pulmonary edema in relation to atrial fibrillation with RVR with development of acute on chronic heart failure.  It is possible that it could be an component of multifocal pneumonia as seen on CT imaging.  Patient with documented hypoxia of SpO2 down to 72% on 4 L/minute of oxygen supplementation requiring BiPAP for management. -Continue BiPAP as needed for respiratory distress -Wean to room air; patient uses oxygen at night  Acute on chronic diastolic heart failure In setting of atrial fibrillation with rapid ventricular rate.  Echocardiogram this admission was significant for an LVEF of 60 to 65% with indeterminant diastolic function.  Troponin elevation with a peak of 852.  Cardiology consulted and patient was started on Lasix IV diuresis.  Urine output not documented in measured output.  Weight of 87.7 kg on admission. Weight of 85.9 kg today. -Cardiology recommendations: Lasix IV -Daily weights, strict ins and out  Demand ischemia Peak troponin of 852.  Per cardiology likely demand ischemia in setting of atrial fibrillation with RVR in addition to acute heart failure.   Recommendation for outpatient stress test versus PET.  Recommendation to continue heparin IV for now.  Permanent atrial fibrillation with RVR Patient is managed on metoprolol 50 mg twice daily and Eliquis as an outpatient.  Patient presented with RVR with with heart rates into the 150s.  She was started on diltiazem drip with resultant hypotension.  Diltiazem discontinued with continued stable heart rates. -Cardiology recommendations: Continue metoprolol, Eliquis  Hypotension Secondary to medication effect of diltiazem infusion in addition to Lasix IV diuresis.  Diltiazem IV infusion discontinued with resolution of hypotension.  AKI Baseline creatinine is about 0.8-0.9.  Creatinine of 1.21 on admission likely related to fluid overload.  Creatinine down to 0.93 with IV diuresis.  Dependent diabetes mellitus type 2 Uncontrolled with hemoglobin A1c of 11.3%.  Patient is on insulin glargine 35 units twice daily in addition to insulin lispro 3 times daily via sliding scale, metformin. -Continue insulin glargine 30 units twice daily and sliding scale insulin -Hold metformin  High anion gap metabolic acidosis Likely related to lactic acidosis and possibly contributed to by acute kidney injury.  Anion gap closed and acidosis has improved slightly.  Chronic pain -Continue MS Contin 15 mg twice daily  Hyponatremia Appears to be a component of pseudohyponatremia secondary to hyperglycemia.  Corrected sodium of 132 from BMP yesterday. -Follow-up BMP  Chest pain Episode overnight. Right-sided pressure-like. Chest x-ray obtained with mild patchiness. Troponin obtained as well and was elevated but significantly down from prior. Pain did improve with nitroglycerin, however. Cardiology is following.  Hypoalbuminemia Mild.  Nonobstructive CAD Noted.  Patient with elevated troponin this admission as mentioned above, likely related to demand ischemia. -Continue aspirin and  Lipitor  Hyperlipidemia -Continue  Lipitor  Anxiety -Continue Ativan 0.5 mg 3 times daily -Hydroxyzine 25 mg as needed  Obesity Estimated body mass index is 34.64 kg/m as calculated from the following:   Height as of this encounter: 5\' 2"  (1.575 m).   Weight as of this encounter: 85.9 kg.   DVT prophylaxis: Heparin IV Code Status:   Code Status: Full Code Family Communication: None at bedside Disposition Plan: Discharge home pending cardiology recommendations/management.   Consultants:  Cardiology  Procedures:  8/14: Transthoracic Echocardiogram  Antimicrobials: None    Subjective: Continues to breath well. No issues overnight.  Objective: BP 131/61 (BP Location: Right Arm)   Pulse 76   Temp 98.5 F (36.9 C) (Oral)   Resp 20   Ht 5\' 2"  (1.575 m)   Wt 85.9 kg   SpO2 99%   BMI 34.64 kg/m   Examination:  General exam: Appears calm and comfortable Respiratory system: Clear to auscultation. Respiratory effort normal. Cardiovascular system: S1 & S2 heard, RRR. Gastrointestinal system: Abdomen is nondistended, soft and nontender. Normal bowel sounds heard. Central nervous system: Alert and oriented. No focal neurological deficits. Musculoskeletal: No edema. No calf tenderness Skin: No cyanosis. No rashes Psychiatry: Judgement and insight appear normal. Mood & affect appropriate.    Data Reviewed: I have personally reviewed following labs and imaging studies  CBC Lab Results  Component Value Date   WBC 12.4 (H) 01/17/2023   RBC 4.48 01/17/2023   HGB 13.0 01/17/2023   HCT 40.7 01/17/2023   MCV 90.8 01/17/2023   MCH 29.0 01/17/2023   PLT 235 01/17/2023   MCHC 31.9 01/17/2023   RDW 14.7 01/17/2023   LYMPHSABS 2.7 12/25/2022   MONOABS 0.6 11/14/2022   EOSABS 0.2 12/25/2022   BASOSABS 0.0 12/25/2022     Last metabolic panel Lab Results  Component Value Date   NA 133 (L) 01/17/2023   K 4.5 01/17/2023   CL 96 (L) 01/17/2023   CO2 27 01/17/2023    BUN 32 (H) 01/17/2023   CREATININE 0.93 01/17/2023   GLUCOSE 175 (H) 01/17/2023   GFRNONAA >60 01/17/2023   GFRAA 71 06/13/2020   CALCIUM 8.9 01/17/2023   PHOS 3.1 01/13/2023   PROT 6.6 01/13/2023   ALBUMIN 3.3 (L) 01/13/2023   LABGLOB 2.8 11/26/2022   AGRATIO 1.4 09/16/2022   BILITOT 0.5 01/13/2023   ALKPHOS 53 01/13/2023   AST 19 01/13/2023   ALT 16 01/13/2023   ANIONGAP 10 01/17/2023    GFR: Estimated Creatinine Clearance: 53.1 mL/min (by C-G formula based on SCr of 0.93 mg/dL).  Recent Results (from the past 240 hour(s))  MRSA Next Gen by PCR, Nasal     Status: None   Collection Time: 01/12/23  9:43 PM   Specimen: Nasal Mucosa; Nasal Swab  Result Value Ref Range Status   MRSA by PCR Next Gen NOT DETECTED NOT DETECTED Final    Comment: (NOTE) The GeneXpert MRSA Assay (FDA approved for NASAL specimens only), is one component of a comprehensive MRSA colonization surveillance program. It is not intended to diagnose MRSA infection nor to guide or monitor treatment for MRSA infections. Test performance is not FDA approved in patients less than 45 years old. Performed at [facility], [address], [place], [place] [address]       Radiology Studies: No results found.    LOS: 5 days    Jacquelin Hawking, MD [facility] 01/17/2023, 2:22 PM   If 7PM-7AM, please contact night-coverage [email or web address]

## 2023-01-17 NOTE — Progress Notes (Signed)
Walked patient in hallway without oxygen; patient oxygen saturation stayed 91%; patient walked 139ft; patient was in no distress

## 2023-01-17 NOTE — Progress Notes (Signed)
   01/17/23 1923  BiPAP/CPAP/SIPAP  Reason BIPAP/CPAP not in use Other(comment) (no need of bipap no resp distress noted)  BiPAP/CPAP /SiPAP Vitals  Resp 18  MEWS Score/Color  MEWS Score 0  MEWS Score Color Green

## 2023-01-17 NOTE — Progress Notes (Signed)
Rounding Note    Patient Name: Amber Stephenson Date of Encounter: 01/17/2023  [facility] Cardiologist: Armanda Magic, MD   Subjective   No complaints  Inpatient Medications    Scheduled Meds:  allopurinol  100 mg Oral Daily   apixaban  5 mg Oral BID   ARIPiprazole  2 mg Oral QHS   atorvastatin  40 mg Oral Daily   Chlorhexidine Gluconate Cloth  6 each Topical QHS   cholecalciferol  1,000 Units Oral Q1500   cyanocobalamin  1,000 mcg Oral Daily   doxepin  10 mg Oral QHS   escitalopram  10 mg Oral Daily   furosemide  40 mg Intravenous BID   insulin aspart  0-20 Units Subcutaneous TID PC & HS   insulin glargine-yfgn  30 Units Subcutaneous BID   LORazepam  0.5 mg Oral TID   metoprolol tartrate  50 mg Oral BID   morphine  15 mg Oral Q12H   norethindrone  10 mg Oral Daily   OXcarbazepine  150 mg Oral BID   pregabalin  200 mg Oral QHS   Continuous Infusions:  PRN Meds: acetaminophen, hydrOXYzine, nitroGLYCERIN, ondansetron (ZOFRAN) IV, mouth rinse, zolpidem   Vital Signs    Vitals:   01/16/23 2259 01/17/23 0300 01/17/23 0530 01/17/23 0656  BP: 128/68 129/74  127/75  Pulse: 78 89  87  Resp: 15 15  16   Temp: 98.5 F (36.9 C) 97.8 F (36.6 C)  (!) 97.4 F (36.3 C)  TempSrc: Oral Oral  Oral  SpO2: 98% 96%  96%  Weight:   85.9 kg   Height:        Intake/Output Summary (Last 24 hours) at 01/17/2023 0721 Last data filed at 01/17/2023 0600 Gross per 24 hour  Intake 750 ml  Output --  Net 750 ml      01/17/2023    5:30 AM 01/16/2023    5:00 AM 01/15/2023    7:52 AM  Last 3 Weights  Weight (lbs) 189 lb 6 oz 188 lb 15 oz 186 lb 4.6 oz  Weight (kg) 85.9 kg 85.7 kg 84.5 kg      Telemetry    Rate controlled afib - Personally Reviewed  ECG    N/a - Personally Reviewed  Physical Exam   GEN: No acute distress.   Neck: No JVD Cardiac: irreg  Respiratory: Crackles bilaterally GI: Soft, nontender, non-distended  MS: No edema; No deformity. Neuro:   Nonfocal  Psych: Normal affect   Labs    High Sensitivity Troponin:   Recent Labs  Lab 01/12/23 1604 01/12/23 2156 01/13/23 0034 01/15/23 0305 01/15/23 0541  TROPONINIHS 377* 852* 838* 73* 64*     Chemistry Recent Labs  Lab 01/12/23 1415 01/13/23 0340 01/14/23 1323 01/15/23 0305 01/16/23 0311 01/17/23 0543  NA 131* 128*   < > 133* 131* 133*  K 4.2 4.3   < > 4.2 4.6 4.5  CL 94* 98   < > 100 96* 96*  CO2 18* 19*   < > 25 29 27   GLUCOSE 487* 362*   < > 209* 189* 175*  BUN 27* 38*   < > 30* 34* 32*  CREATININE 1.21* 1.14*   < > 0.70 0.94 0.93  CALCIUM 8.9 8.2*   < > 8.5* 8.8* 8.9  MG 2.2 2.2  --   --   --   --   PROT  --  6.6  --   --   --   --  ALBUMIN  --  3.3*  --   --   --   --   AST  --  19  --   --   --   --   ALT  --  16  --   --   --   --   ALKPHOS  --  53  --   --   --   --   BILITOT  --  0.5  --   --   --   --   GFRNONAA 47* 50*   < > >60 >60 >60  ANIONGAP 19* 11   < > 8 6 10    < > = values in this interval not displayed.    Lipids  Recent Labs  Lab 01/13/23 1157  CHOL 200  TRIG 134  HDL 32*  LDLCALC 141*  CHOLHDL 6.3    Hematology Recent Labs  Lab 01/15/23 0305 01/16/23 0311 01/17/23 0543  WBC 9.8 11.4* 12.4*  RBC 4.38 4.38 4.48  HGB 12.8 12.6 13.0  HCT 39.1 39.1 40.7  MCV 89.3 89.3 90.8  MCH 29.2 28.8 29.0  MCHC 32.7 32.2 31.9  RDW 15.2 15.0 14.7  PLT 210 233 235   Thyroid No results for input(s): "TSH", "FREET4" in the last 168 hours.  BNP Recent Labs  Lab 01/12/23 1415 01/15/23 0305  BNP 446.1* 154.8*    DDimer No results for input(s): "DDIMER" in the last 168 hours.   Radiology    No results found.  Cardiac Studies       Assessment & Plan    1.Elevated troponin - peak 852 in setting of afib with RVR, HTN SBP to 190s -cath 2019 showed nonobstructive CAD - thought to be demand ischemia - echo LVEF 60-65%< no WMAs, indet diastolic, severe LAE - no plans for ischemic testing at this time  2.Acute on chronic  HFpEF  echo LVEF 60-65%< no WMAs, indet diastolic, severe LAE - BNP 446, CXR pulm edema - Suspect patient developed flash pulmonary edema in the setting of HTN urgency and Afib with RVR.  - I/Os are incomplete. Uncleare accuracy of bed weights, will ask for daily standing weights. Received IV lasix 40mg  x 2 yesterday. Stable renal function. Ongoing crackles on exam, increase IV lasix to 60mg  bid today and reassess diuretic dosing tomorrow.  -yeast infections on jardiance in the past, avoiding SGLT2i  3.Permanent afib - presented with afib with RVR - initially on dilt gtt, transitioned off to oral beta blocker.  - currently on lopressor 50mg  bid, eliquis 5mg  bid   For questions or updates, please contact [facility] Please consult [email or web address] for contact info under        Signed, Dina Rich, MD  01/17/2023, 7:21 AM

## 2023-01-18 DIAGNOSIS — J9621 Acute and chronic respiratory failure with hypoxia: Secondary | ICD-10-CM | POA: Diagnosis not present

## 2023-01-18 DIAGNOSIS — I5033 Acute on chronic diastolic (congestive) heart failure: Secondary | ICD-10-CM | POA: Diagnosis not present

## 2023-01-18 LAB — BASIC METABOLIC PANEL
Anion gap: 9 (ref 5–15)
BUN: 34 mg/dL — ABNORMAL HIGH (ref 8–23)
CO2: 28 mmol/L (ref 22–32)
Calcium: 8.9 mg/dL (ref 8.9–10.3)
Chloride: 94 mmol/L — ABNORMAL LOW (ref 98–111)
Creatinine, Ser: 0.88 mg/dL (ref 0.44–1.00)
GFR, Estimated: >60 mL/min (ref >60)
Glucose, Bld: 249 mg/dL — ABNORMAL HIGH (ref 70–99)
Potassium: 4 mmol/L (ref 3.5–5.1)
Sodium: 131 mmol/L — ABNORMAL LOW (ref 135–145)

## 2023-01-18 LAB — GLUCOSE, CAPILLARY
Glucose-Capillary: 255 mg/dL — ABNORMAL HIGH (ref 70–99)
Glucose-Capillary: 326 mg/dL — ABNORMAL HIGH (ref 70–99)

## 2023-01-18 MED ORDER — FUROSEMIDE 40 MG PO TABS: 40.0000 mg | ORAL_TABLET | Freq: Two times a day (BID) | ORAL | 0 refills | Status: DC

## 2023-01-18 MED ORDER — ATORVASTATIN CALCIUM 40 MG PO TABS: 40.0000 mg | ORAL_TABLET | Freq: Every day | ORAL | 0 refills | Status: DC

## 2023-01-18 MED ORDER — FUROSEMIDE 40 MG PO TABS
40.0000 mg | ORAL_TABLET | Freq: Two times a day (BID) | ORAL | Status: DC
  Administered 2023-01-18: 40 mg via ORAL
  Filled 2023-01-18: qty 1

## 2023-01-18 NOTE — Progress Notes (Signed)
Physical Therapy Treatment Patient Details Name: Amber Stephenson MRN: [id number] DOB: January 18, 1946 Today's Date: 01/18/2023   History of Present Illness 77 y.o. female with a history of chronic diastolic heart failure, chronic atrial fibrillation on Eliquis, chronic respite failure with hypoxia on 2 L/min of oxygen, insulin-dependent type 2 diabetes, primary hypertension, hyperlipidemia, bipolar 1 disorder, endometrial cancer.  Patient presented secondary to development of acute respiratory distress while attempting to lay flat for CT scan and was found to have evidence of likely flash pulmonary edema from atrial fibrillation with RVR. Cardiology consulted. Patient started on diuresis and management for atrial fibrillation.    PT Comments  Pt requesting to use BSC so assisted to [facility] and then ambulated short distance in hallway.  Pt limited by mild dizziness and heart racing.  SpO2 91-94% on room air and HR up to 146 bpm during ambulation (RN in hallway and notified/aware).  Pt returned to room and vitals obtained as below.  Pt anticipates d/c home today.  Continue to recommend HHPT upon d/c.   01/18/23 1027  Vital Signs  Pulse Rate (!) 106  Pulse Rate Source Monitor  BP (!) 140/76  BP Location Left Arm  BP Method Automatic  Patient Position (if appropriate) Sitting  Oxygen Therapy  SpO2 91 %  O2 Device Room Air      If plan is discharge home, recommend the following: Help with stairs or ramp for entrance;Assistance with cooking/housework   Can travel by private vehicle        Equipment Recommendations  None recommended by PT    Recommendations for Other Services       Precautions / Restrictions Precautions Precautions: Fall Precaution Comments: chronic 2L O2     Mobility  Bed Mobility Overal bed mobility: Needs Assistance Bed Mobility: Supine to Sit     Supine to sit: Supervision, HOB elevated, Used rails          Transfers Overall transfer level: Needs  assistance Equipment used: None Transfers: Sit to/from Stand, Bed to chair/wheelchair/BSC Sit to Stand: Supervision Stand pivot transfers: Supervision         General transfer comment: requested to use BSC for urgency    Ambulation/Gait Ambulation/Gait assistance: Contact guard assist Gait Distance (Feet): 80 Feet Assistive device: None Gait Pattern/deviations: Step-through pattern, Decreased stride length Gait velocity: decr     General Gait Details: mildly unsteady gait however pt declined using RW, SPO2 91-94% on room air (pt not wearing O2 in room on arrival); HR elevated to 146 bpm (RN notified); pt felt a little dizzy and heart racing so returned to room   Stairs             Wheelchair Mobility     Tilt Bed    Modified Rankin (Stroke Patients Only)       Balance Overall balance assessment: History of Falls                                          Cognition Arousal: Alert Behavior During Therapy: Flat affect Overall Cognitive Status: Within Functional Limits for tasks assessed                                          Exercises      General Comments  Pertinent Vitals/Pain Pain Assessment Pain Assessment: No/denies pain    Home Living                          Prior Function            PT Goals (current goals can now be found in the care plan section) Progress towards PT goals: Progressing toward goals    Frequency    Min 1X/week      PT Plan      Co-evaluation              AM-PAC PT "6 Clicks" Mobility   Outcome Measure  Help needed turning from your back to your side while in a flat bed without using bedrails?: A Little Help needed moving from lying on your back to sitting on the side of a flat bed without using bedrails?: A Little Help needed moving to and from a bed to a chair (including a wheelchair)?: A Little Help needed standing up from a chair using your arms  (e.g., wheelchair or bedside chair)?: A Little Help needed to walk in hospital room?: A Little Help needed climbing 3-5 steps with a railing? : A Lot 6 Click Score: 17    End of Session Equipment Utilized During Treatment: Gait belt Activity Tolerance: Patient tolerated treatment well Patient left: in chair;with call bell/phone within reach;with chair alarm set;with family/visitor present Nurse Communication: Mobility status PT Visit Diagnosis: Difficulty in walking, not elsewhere classified (R26.2)     Time: 1015-1030 PT Time Calculation (min) (ACUTE ONLY): 15 min  Charges:    $Gait Training: 8-22 mins PT General Charges $$ ACUTE PT VISIT: 1 Visit                     Paulino Door, DPT Physical Therapist Acute Rehabilitation Services Office: [phone]    Amber Stephenson 01/18/2023, 3:20 PM

## 2023-01-18 NOTE — Progress Notes (Addendum)
Rounding Note    Patient Name: Amber Stephenson Date of Encounter: 01/18/2023  [facility] Cardiologist: Armanda Magic, MD   Subjective   SOB resolved.REports ambulated yesterday and felt well  Inpatient Medications    Scheduled Meds:  allopurinol  100 mg Oral Daily   apixaban  5 mg Oral BID   ARIPiprazole  2 mg Oral QHS   atorvastatin  40 mg Oral Daily   Chlorhexidine Gluconate Cloth  6 each Topical QHS   cholecalciferol  1,000 Units Oral Q1500   cyanocobalamin  1,000 mcg Oral Daily   doxepin  10 mg Oral QHS   escitalopram  10 mg Oral Daily   insulin aspart  0-20 Units Subcutaneous TID PC & HS   insulin glargine-yfgn  30 Units Subcutaneous BID   LORazepam  0.5 mg Oral TID   metoprolol tartrate  50 mg Oral BID   morphine  15 mg Oral Q12H   norethindrone  10 mg Oral Daily   OXcarbazepine  150 mg Oral BID   pregabalin  200 mg Oral QHS   Continuous Infusions:  PRN Meds: acetaminophen, hydrOXYzine, nitroGLYCERIN, ondansetron (ZOFRAN) IV, mouth rinse, zolpidem   Vital Signs    Vitals:   01/17/23 1800 01/17/23 1923 01/17/23 1929 01/18/23 0526  BP:   120/87 128/76  Pulse:   76 87  Resp:  18 18   Temp:   98.4 F (36.9 C) (!) 97.5 F (36.4 C)  TempSrc:   Oral Oral  SpO2: 92%  95% 91%  Weight:    86.1 kg  Height:        Intake/Output Summary (Last 24 hours) at 01/18/2023 0733 Last data filed at 01/18/2023 0500 Gross per 24 hour  Intake 1316 ml  Output 3700 ml  Net -2384 ml      01/18/2023    5:26 AM 01/17/2023    5:30 AM 01/16/2023    5:00 AM  Last 3 Weights  Weight (lbs) 189 lb 13.1 oz 189 lb 6 oz 188 lb 15 oz  Weight (kg) 86.1 kg 85.9 kg 85.7 kg      Telemetry    Afib primarily rate controlled - Personally Reviewed  ECG    N/a - Personally Reviewed  Physical Exam   GEN: No acute distress.   Neck: No JVD Cardiac: irreg Respiratory: slight crackles bilaterally GI: Soft, nontender, non-distended  MS: No edema; No deformity. Neuro:   Nonfocal  Psych: Normal affect   Labs    High Sensitivity Troponin:   Recent Labs  Lab 01/12/23 1604 01/12/23 2156 01/13/23 0034 01/15/23 0305 01/15/23 0541  TROPONINIHS 377* 852* 838* 73* 64*     Chemistry Recent Labs  Lab 01/12/23 1415 01/13/23 0340 01/14/23 1323 01/16/23 0311 01/17/23 0543 01/18/23 0531  NA 131* 128*   < > 131* 133* 131*  K 4.2 4.3   < > 4.6 4.5 4.0  CL 94* 98   < > 96* 96* 94*  CO2 18* 19*   < > 29 27 28   GLUCOSE 487* 362*   < > 189* 175* 249*  BUN 27* 38*   < > 34* 32* 34*  CREATININE 1.21* 1.14*   < > 0.94 0.93 0.88  CALCIUM 8.9 8.2*   < > 8.8* 8.9 8.9  MG 2.2 2.2  --   --   --   --   PROT  --  6.6  --   --   --   --   ALBUMIN  --  3.3*  --   --   --   --   AST  --  19  --   --   --   --   ALT  --  16  --   --   --   --   ALKPHOS  --  53  --   --   --   --   BILITOT  --  0.5  --   --   --   --   GFRNONAA 47* 50*   < > >60 >60 >60  ANIONGAP 19* 11   < > 6 10 9    < > = values in this interval not displayed.    Lipids  Recent Labs  Lab 01/13/23 1157  CHOL 200  TRIG 134  HDL 32*  LDLCALC 141*  CHOLHDL 6.3    Hematology Recent Labs  Lab 01/15/23 0305 01/16/23 0311 01/17/23 0543  WBC 9.8 11.4* 12.4*  RBC 4.38 4.38 4.48  HGB 12.8 12.6 13.0  HCT 39.1 39.1 40.7  MCV 89.3 89.3 90.8  MCH 29.2 28.8 29.0  MCHC 32.7 32.2 31.9  RDW 15.2 15.0 14.7  PLT 210 233 235   Thyroid No results for input(s): "TSH", "FREET4" in the last 168 hours.  BNP Recent Labs  Lab 01/12/23 1415 01/15/23 0305  BNP 446.1* 154.8*    DDimer No results for input(s): "DDIMER" in the last 168 hours.   Radiology    No results found.  Cardiac Studies    Assessment & Plan    1.Elevated troponin - peak 852 in setting of afib with RVR, HTN SBP to 190s -cath 2019 showed nonobstructive CAD - thought to be demand ischemia - echo LVEF 60-65%, no WMAs, indet diastolic, severe LAE - no plans for ischemic testing at this time   2.Acute on chronic HFpEF   echo LVEF 60-65%, no WMAs, indet diastolic, severe LAE - BNP 446, CXR pulm edema - Suspect patient developed flash pulmonary edema in the setting of HTN urgency and Afib with RVR.  - negative 2.3 L yesterday, I/Os for total admit are incomplete. Unclear accuracy weights weights. Received IV lasix 60mg  x 2 yesterday. Renal function is stable.  -yeast infections on jardiance in the past, avoiding SGLT2i  -near euvolemic, reports ambluated yesterday and felt well. - can transition to oral lasix 40mg  bid. Would be ok for discharge from cardiac standpoint.    3.Permanent afib - presented with afib with RVR - initially on dilt gtt, transitioned off to oral beta blocker.  - currently on lopressor 50mg  bid, eliquis 5mg  bid - rates primarily controlled, mildly elevated this AM before meds.   Ok for d/c, we will sign off inpatient care.     For questions or updates, please contact [facility] Please consult [email or web address] for contact info under        Signed, Dina Rich, MD  01/18/2023, 7:33 AM

## 2023-01-18 NOTE — Discharge Instructions (Signed)
Amber Stephenson,  You were in the hospital because of heart failure and fluid in your lung from atrial fibrillation. This has improved with treatment. The cardiologist has adjusted your medications. Please follow-up with your PCP and cardiologist.

## 2023-01-18 NOTE — TOC Transition Note (Signed)
Transition of Care [facility]) - CM/SW Discharge Note   Patient Details  Name: Amber Stephenson MRN: [id number] Date of Birth: 03-21-1946  [facility] CM/SW Contact:  Georgie Chard, LCSW Phone Number: 01/18/2023, 12:06 PM   Clinical Narrative:     CSW spoke to family. At this time family is requesting [facility] PT. This CSW has set up [facility] PT with [facility] upon families request. AT this time there are no further TOC needs.        Patient Goals and CMS Choice      Discharge Placement                         Discharge Plan and Services Additional resources added to the After Visit Summary for                                       Social Determinants of Health (SDOH) Interventions SDOH Screenings   Food Insecurity: No Food Insecurity (01/14/2023)  Housing: Patient Declined (01/14/2023)  Transportation Needs: No Transportation Needs (01/14/2023)  Utilities: Not At Risk (01/17/2023)  Alcohol Screen: Low Risk  (12/13/2021)  Depression (PHQ2-9): Low Risk  (01/07/2023)  Financial Resource Strain: Low Risk  (06/30/2022)  Physical Activity: Inactive (12/13/2021)  Social Connections: Socially Integrated (12/13/2021)  Stress: No Stress Concern Present (12/13/2021)  Tobacco Use: Low Risk  (01/12/2023)     Readmission Risk Interventions    01/13/2023   10:06 AM 11/11/2022    4:01 PM 06/29/2022   10:48 AM  Readmission Risk Prevention Plan  Transportation Screening Complete Complete Complete  HRI or Home Care Consult   Complete  Social Work Consult for Recovery Care Planning/Counseling   Complete  Palliative Care Screening   Not Applicable  Medication Review [employer])  Complete Complete  HRI or Home Care Consult  Complete   SW Recovery Care/Counseling Consult  Complete   Palliative Care Screening  Not Applicable   Skilled Nursing Facility  Patient Refused

## 2023-01-18 NOTE — Plan of Care (Signed)
  Problem: Health Behavior/Discharge Planning: Goal: Ability to identify and utilize available resources and services will improve Outcome: Progressing

## 2023-01-18 NOTE — Plan of Care (Signed)
  Problem: Education: Goal: Ability to describe self-care measures that may prevent or decrease complications (Diabetes Survival Skills Education) will improve Outcome: Adequate for Discharge Goal: Individualized Educational Video(s) Outcome: Adequate for Discharge   Problem: Coping: Goal: Ability to adjust to condition or change in health will improve Outcome: Adequate for Discharge   Problem: Fluid Volume: Goal: Ability to maintain a balanced intake and output will improve Outcome: Adequate for Discharge   Problem: Health Behavior/Discharge Planning: Goal: Ability to identify and utilize available resources and services will improve 01/18/2023 1147 by Epimenio Foot, RN Outcome: Adequate for Discharge 01/18/2023 1123 by Epimenio Foot, RN Outcome: Progressing Goal: Ability to manage health-related needs will improve Outcome: Adequate for Discharge   Problem: Metabolic: Goal: Ability to maintain appropriate glucose levels will improve Outcome: Adequate for Discharge   Problem: Nutritional: Goal: Maintenance of adequate nutrition will improve Outcome: Adequate for Discharge Goal: Progress toward achieving an optimal weight will improve Outcome: Adequate for Discharge   Problem: Skin Integrity: Goal: Risk for impaired skin integrity will decrease Outcome: Adequate for Discharge   Problem: Tissue Perfusion: Goal: Adequacy of tissue perfusion will improve Outcome: Adequate for Discharge   Problem: Education: Goal: Knowledge of General Education information will improve Description: Including pain rating scale, medication(s)/side effects and non-pharmacologic comfort measures Outcome: Adequate for Discharge   Problem: Health Behavior/Discharge Planning: Goal: Ability to manage health-related needs will improve Outcome: Adequate for Discharge   Problem: Clinical Measurements: Goal: Ability to maintain clinical measurements within normal limits will  improve Outcome: Adequate for Discharge Goal: Will remain free from infection Outcome: Adequate for Discharge Goal: Diagnostic test results will improve Outcome: Adequate for Discharge Goal: Respiratory complications will improve Outcome: Adequate for Discharge Goal: Cardiovascular complication will be avoided Outcome: Adequate for Discharge   Problem: Activity: Goal: Risk for activity intolerance will decrease Outcome: Adequate for Discharge   Problem: Nutrition: Goal: Adequate nutrition will be maintained Outcome: Adequate for Discharge   Problem: Coping: Goal: Level of anxiety will decrease Outcome: Adequate for Discharge   Problem: Elimination: Goal: Will not experience complications related to bowel motility Outcome: Adequate for Discharge Goal: Will not experience complications related to urinary retention Outcome: Adequate for Discharge   Problem: Pain Managment: Goal: General experience of comfort will improve Outcome: Adequate for Discharge   Problem: Safety: Goal: Ability to remain free from injury will improve Outcome: Adequate for Discharge   Problem: Skin Integrity: Goal: Risk for impaired skin integrity will decrease Outcome: Adequate for Discharge

## 2023-01-18 NOTE — Progress Notes (Signed)
Patient and spouse given discharge, medication, and follow up instructions, verbalized understanding, IV x 2 and telemetry monitor removed, personal belongings with patient, spouse to transport home

## 2023-01-18 NOTE — Discharge Summary (Signed)
Physician Discharge Summary   Patient: Amber Stephenson MRN: [id number] DOB: 1945/08/17  Admit date:     01/12/2023  Discharge date: 01/18/23  Discharge Physician: Jacquelin Hawking, MD   PCP: Mechele Claude, MD   Recommendations at discharge:  Hospital follow-up with PCP and cardiology  Discharge Diagnoses: Principal Problem:   Acute on chronic hypoxic respiratory failure (HCC) Active Problems:   Acute diastolic CHF (congestive heart failure) (HCC)   Insulin dependent type 2 diabetes mellitus (HCC)   Chronic atrial fibrillation with RVR (HCC)   Atrial fibrillation with rapid ventricular response (HCC)   Acute pulmonary edema (HCC)   Elevated troponin   AKI (acute kidney injury) (HCC)   Hypotension   Metabolic acidosis   Endometrial cancer (HCC)   Pure hypercholesterolemia   Primary hypertension  Resolved Problems:   * No resolved hospital problems. *  Hospital Course: Amber Stephenson is a 77 y.o. female with a history of chronic diastolic heart failure, chronic atrial fibrillation on Eliquis, chronic respite failure with hypoxia on 2 L/min of oxygen, insulin-dependent type 2 diabetes, primary hypertension, hyperlipidemia, bipolar 1 disorder, endometrial cancer.  Patient presented secondary to development of acute respiratory distress while attempting to lay flat for CT scan and was found to have evidence of likely flash pulmonary edema from atrial fibrillation with RVR. Cardiology consulted. Patient started on diuresis and management for acute heart failure.  Patient with good urine output with Lasix IV diuresis.  Kidney function remained stable.  Patient was eventually transitioned to Lasix p.o. for discharge.  Oxygen weaned to room air.  Assessment and Plan:  Acute on chronic respiratory failure with hypoxia Secondary to flash pulmonary edema in relation to atrial fibrillation with RVR with development of acute on chronic heart failure.  It is possible that it could be an  component of multifocal pneumonia as seen on CT imaging.  Patient with documented hypoxia of SpO2 down to 72% on 4 L/minute of oxygen supplementation requiring BiPAP for management.  Patient weaned to room air prior to discharge.   Acute on chronic diastolic heart failure In setting of atrial fibrillation with rapid ventricular rate.  Echocardiogram this admission was significant for an LVEF of 60 to 65% with indeterminant diastolic function.  Troponin elevation with a peak of 852.  Cardiology consulted and patient was started on Lasix IV diuresis.  Patient did not receive accurate urine output documentation.  Weight of 87.7 kg on admission, down to 86.1 kg prior to discharge.  Cardiology with recommendations to continue Lasix 40 mg p.o. twice daily on discharge.  Patient to follow-up with cardiology as an outpatient.   Demand ischemia Peak troponin of 852.  Per cardiology likely demand ischemia in setting of atrial fibrillation with RVR in addition to acute heart failure.  Recommendation for outpatient stress test versus PET.  Recommendation to continue heparin IV for now.   Permanent atrial fibrillation with RVR Patient is managed on metoprolol 50 mg twice daily and Eliquis as an outpatient.  Patient presented with RVR with with heart rates into the 150s.  She was started on diltiazem drip with resultant hypotension.  Diltiazem discontinued with continued stable heart rates.  Continue metoprolol and Eliquis as an outpatient.   Hypotension Secondary to medication effect of diltiazem infusion in addition to Lasix IV diuresis.  Diltiazem IV infusion discontinued with resolution of hypotension.   AKI Baseline creatinine is about 0.8-0.9.  Creatinine of 1.21 on admission likely related to fluid overload.  Creatinine down to  0.93 with IV diuresis.   Dependent diabetes mellitus type 2 Uncontrolled with hemoglobin A1c of 11.3%.  Patient is on insulin glargine 35 units twice daily in addition to insulin  lispro 3 times daily via sliding scale, metformin.  Resume home regimen on discharge.   High anion gap metabolic acidosis Likely related to lactic acidosis and possibly contributed to by acute kidney injury.  Anion gap closed and acidosis has improved slightly.   Chronic pain Continue MS Contin 15 mg twice daily   Hyponatremia Appears to be a component of pseudohyponatremia secondary to hyperglycemia.  Improved.  Corrected sodium of 133 on day of discharge.   Chest pain Episode overnight. Right-sided pressure-like. Chest x-ray obtained with mild patchiness. Troponin obtained as well and was elevated but significantly down from prior. Pain did improve with nitroglycerin, however. Cardiology is following.   Hypoalbuminemia Mild.   Nonobstructive CAD Noted.  Patient with elevated troponin this admission as mentioned above, likely related to demand ischemia. Continue aspirin and Lipitor.   Hyperlipidemia Continue Lipitor.   Anxiety Continue home Ativan.   Obesity Estimated body mass index is 34.72 kg/m as calculated from the following:   Height as of this encounter: 5\' 2"  (1.575 m).   Weight as of this encounter: 86.1 kg.   Consultants: Cardiology Procedures performed: Transthoracic echocardiogram Disposition: Home health Diet recommendation: Carb modified/heart healthy   DISCHARGE MEDICATION: Allergies as of 01/18/2023       Reactions   Iodine Anaphylaxis   Ivp Dye [iodinated Contrast Media] Anaphylaxis, Swelling   Throat closes   Latex Other (See Comments)   Latex tape pulls skin with it   Tizanidine Other (See Comments)   Weakness, goofy, bad dreams        Medication List     STOP taking these medications    hydrochlorothiazide 12.5 MG capsule Commonly known as: MICROZIDE   lisinopril 10 MG tablet Commonly known as: ZESTRIL   predniSONE 50 MG tablet Commonly known as: DELTASONE       TAKE these medications    albuterol 108 (90 Base) MCG/ACT  inhaler Commonly known as: VENTOLIN HFA Inhale 2 puffs into the lungs every 6 (six) hours as needed for wheezing or shortness of breath.   allopurinol 100 MG tablet Commonly known as: ZYLOPRIM Take 100 mg by mouth daily.   apixaban 5 MG Tabs tablet Commonly known as: Eliquis Take 1 tablet (5 mg total) by mouth 2 (two) times daily.   ARIPiprazole 2 MG tablet Commonly known as: ABILIFY Take 2 mg by mouth at bedtime.   atorvastatin 40 MG tablet Commonly known as: LIPITOR Take 1 tablet (40 mg total) by mouth daily. Start taking on: January 19, 2023   Belsomra 20 MG Tabs Generic drug: Suvorexant Take 1 tablet by mouth at bedtime as needed.   cyanocobalamin 1000 MCG tablet Commonly known as: VITAMIN B12 Take 1 tablet (1,000 mcg total) by mouth daily.   Dexcom G7 Sensor Misc Change sensor every 10 days What changed:  how much to take how to take this when to take this   diphenhydrAMINE 50 MG tablet Commonly known as: BENADRYL Take 1 tablet (50 mg total) by mouth once for 1 dose. Take 1 hour prior to CT scan   doxepin 10 MG capsule Commonly known as: SINEQUAN Take 10 mg by mouth at bedtime.   Emgality 120 MG/ML Soaj Generic drug: Galcanezumab-gnlm INJECT ONCE A MONTH What changed:  how much to take how to take this when to  take this   escitalopram 10 MG tablet Commonly known as: LEXAPRO Take 10 mg by mouth daily.   fluticasone-salmeterol 100-50 MCG/ACT Aepb Commonly known as: Advair Diskus Inhale 1 puff into the lungs 2 (two) times daily.   furosemide 40 MG tablet Commonly known as: LASIX Take 1 tablet (40 mg total) by mouth 2 (two) times daily.   insulin lispro 100 UNIT/ML KwikPen Commonly known as: HumaLOG KwikPen Max daily 50 units What changed:  how much to take how to take this when to take this additional instructions   Insulin Pen Needle 29G X Misc 1 Device by Does not apply route daily in the afternoon.   Lantus SoloStar 100 UNIT/ML  Solostar Pen Generic drug: insulin glargine Inject 35 Units into the skin 2 (two) times daily.   LORazepam 1 MG tablet Commonly known as: ATIVAN Take 0.5 tablets (0.5 mg total) by mouth 2 (two) times daily.   metFORMIN 500 MG 24 hr tablet Commonly known as: GLUCOPHAGE-XR Take 2 tablets (1,000 mg total) by mouth daily with breakfast.   metoprolol tartrate 50 MG tablet Commonly known as: LOPRESSOR Take 1 tablet (50 mg total) by mouth 2 (two) times daily.   morphine 15 MG 12 hr tablet Commonly known as: MS Contin Take 1 tablet (15 mg total) by mouth every 12 (twelve) hours. For chronic pain- can refill by 01/13/23   Mounjaro 5 MG/0.5ML Pen Generic drug: tirzepatide Inject 5 mg into the skin once a week.   naloxone 4 MG/0.1ML Liqd nasal spray kit Commonly known as: NARCAN Place 1 spray into the nose once.   norethindrone 5 MG tablet Commonly known as: AYGESTIN Take 2 tablets (10 mg total) by mouth daily.   nystatin ointment Commonly known as: MYCOSTATIN Apply 1 Application topically 2 (two) times daily. What changed:  when to take this reasons to take this   nystatin powder Commonly known as: MYCOSTATIN/NYSTOP Apply 1 Application topically as needed (rash). What changed: Another medication with the same name was changed. Make sure you understand how and when to take each.   ondansetron 8 MG disintegrating tablet Commonly known as: ZOFRAN-ODT Take 1 tablet (8 mg total) by mouth every 6 (six) hours as needed for nausea or vomiting.   OXcarbazepine 150 MG tablet Commonly known as: TRILEPTAL Take 1 tablet (150 mg total) by mouth 2 (two) times daily.   pregabalin 200 MG capsule Commonly known as: LYRICA Take 200 mg by mouth daily. What changed: Another medication with the same name was removed. Continue taking this medication, and follow the directions you see here.   Prolia 60 MG/ML Sosy injection Generic drug: denosumab Inject 60 mg into the skin every 6 (six)  months.   senna-docusate 8.6-50 MG tablet Commonly known as: Senokot-S Take 1 tablet by mouth at bedtime. What changed:  when to take this reasons to take this   TYLENOL PO Take 2 tablets by mouth as needed (pain/headache).   Vitamin D (Cholecalciferol) 25 MCG (1000 UT) Tabs Take 1,000 Units by mouth daily in the afternoon.        Follow-up Information     Sharlene Dory, PA-C Follow up on 02/05/2023.   Specialty: Cardiology Why: 1:55PM. Cardiology follow up Contact information: [address] [place] [address] [phone]         Mechele Claude, MD. Schedule an appointment as soon as possible for a visit in 1 week(s).   Specialty: Family Medicine Why: For hospital follow-up Contact  information: [address] [place] [address] [phone]                Discharge Exam: BP (!) 140/76 (BP Location: Left Arm)   Pulse (!) 106   Temp (!) 97.5 F (36.4 C) (Oral)   Resp 18   Ht 5\' 2"  (1.575 m)   Wt 86.1 kg   SpO2 91%   BMI 34.72 kg/m   General exam: Appears calm and comfortable Respiratory system: Clear to auscultation. Respiratory effort normal. Cardiovascular system: S1 & S2 heard, irregular rhythm with normal rate. Gastrointestinal system: Abdomen is nondistended, soft and nontender. Normal bowel sounds heard. Central nervous system: Alert and oriented. No focal neurological deficits. Musculoskeletal: No calf tenderness Skin: No cyanosis. No rashes Psychiatry: Judgement and insight appear normal. Mood & affect appropriate.   Condition at discharge: stable  The results of significant diagnostics from this hospitalization (including imaging, microbiology, ancillary and laboratory) are listed below for reference.   Imaging Studies: DG Chest Port 1 View  Result Date: 01/15/2023 CLINICAL DATA:  Chest pain EXAM: PORTABLE CHEST 1 VIEW COMPARISON:  01/12/2023 FINDINGS: Cardiac shadow is enlarged but stable. Lungs are well aerated  bilaterally. Left basilar airspace opacity is noted. No other focal abnormality is noted. IMPRESSION: Left basilar airspace opacity. Electronically Signed   By: Alcide Clever M.D.   On: 01/15/2023 03:58   ECHOCARDIOGRAM COMPLETE  Result Date: 01/14/2023    ECHOCARDIOGRAM REPORT   Patient Name:   AISHANI PETRASEK Redwine Date of Exam: 01/14/2023 Medical Rec #:  [id number]         Height:       62.0 in Accession #:    [id number]        Weight:       193.3 lb Date of Birth:  1945-09-28          BSA:          1.884 m Patient Age:    77 years          BP:           112/71 mmHg Patient Gender: F                 HR:           77 bpm. Exam Location:  Inpatient Procedure: 2D Echo, Color Doppler and Cardiac Doppler Indications:    CHF-Acute Diastolic I50.31  History:        Patient has prior history of Echocardiogram examinations, most                 recent 11/10/2022. CHF and Cardiomegaly, CAD, Pulmonary HTN,                 Arrythmias:Atrial Fibrillation, Signs/Symptoms:Hypotension; Risk                 Factors:Diabetes, Non-Smoker, Hypertension, Sleep Apnea and                 Dyslipidemia.  Sonographer:    Aron Baba Referring Phys: [id number] Cyndi Bender  Sonographer Comments: Patient is obese and suboptimal parasternal window. Image acquisition challenging due to respiratory motion. IMPRESSIONS  1. Left ventricular ejection fraction, by estimation, is 60 to 65%. The left ventricle has normal function. The left ventricle has no regional wall motion abnormalities. There is mild concentric left ventricular hypertrophy. Left ventricular diastolic function could not be evaluated. Elevated left atrial pressure.  2. Right ventricular systolic function is normal. The right ventricular size  is normal. Tricuspid regurgitation signal is inadequate for assessing PA pressure.  3. Left atrial size was severely dilated.  4. Right atrial size was mildly dilated.  5. The mitral valve is normal in structure. Trivial mitral valve regurgitation. No  evidence of mitral stenosis. Moderate mitral annular calcification.  6. The aortic valve has an indeterminant number of cusps. Aortic valve regurgitation is mild. No aortic stenosis is present.  7. Aortic dilatation noted. There is mild dilatation of the aortic root, measuring 40 mm.  8. The inferior vena cava is dilated in size with <50% respiratory variability, suggesting right atrial pressure of 15 mmHg. FINDINGS  Left Ventricle: Left ventricular ejection fraction, by estimation, is 60 to 65%. The left ventricle has normal function. The left ventricle has no regional wall motion abnormalities. The left ventricular internal cavity size was normal in size. There is  mild concentric left ventricular hypertrophy. Left ventricular diastolic function could not be evaluated due to atrial fibrillation. Left ventricular diastolic function could not be evaluated. Elevated left atrial pressure. Right Ventricle: The right ventricular size is normal. Right ventricular systolic function is normal. Tricuspid regurgitation signal is inadequate for assessing PA pressure. The tricuspid regurgitant velocity is 0.72 m/s, and with an assumed right atrial  pressure of 15 mmHg, the estimated right ventricular systolic pressure is 17.1 mmHg. Left Atrium: Left atrial size was severely dilated. Right Atrium: Right atrial size was mildly dilated. Pericardium: There is no evidence of pericardial effusion. Mitral Valve: The mitral valve is normal in structure. Moderate mitral annular calcification. Trivial mitral valve regurgitation. No evidence of mitral valve stenosis. Tricuspid Valve: The tricuspid valve is normal in structure. Tricuspid valve regurgitation is not demonstrated. No evidence of tricuspid stenosis. Aortic Valve: The aortic valve has an indeterminant number of cusps. Aortic valve regurgitation is mild. Aortic regurgitation PHT measures 682 msec. No aortic stenosis is present. Pulmonic Valve: The pulmonic valve was normal in  structure. Pulmonic valve regurgitation is not visualized. No evidence of pulmonic stenosis. Aorta: Aortic dilatation noted. There is mild dilatation of the aortic root, measuring 40 mm. Venous: The inferior vena cava is dilated in size with less than 50% respiratory variability, suggesting right atrial pressure of 15 mmHg. IAS/Shunts: No atrial level shunt detected by color flow Doppler.  LEFT VENTRICLE PLAX 2D LVIDd:         4.60 cm   Diastology LVIDs:         3.20 cm   LV e' medial:    3.70 cm/s LV PW:         0.90 cm   LV E/e' medial:  34.1 LV IVS:        0.70 cm   LV e' lateral:   4.24 cm/s LVOT diam:     2.20 cm   LV E/e' lateral: 29.7 LV SV:         60 LV SV Index:   32 LVOT Area:     3.80 cm  RIGHT VENTRICLE RV S prime:     5.11 cm/s TAPSE (M-mode): 1.4 cm LEFT ATRIUM              Index        RIGHT ATRIUM           Index LA diam:        5.00 cm  2.65 cm/m   RA Area:     20.00 cm LA Vol (A2C):   154.0 ml 81.72 ml/m  RA Volume:   50.60  ml  26.85 ml/m LA Vol (A4C):   124.0 ml 65.80 ml/m LA Biplane Vol: 140.0 ml 74.29 ml/m  AORTIC VALVE LVOT Vmax:   82.00 cm/s LVOT Vmean:  56.300 cm/s LVOT VTI:    0.158 m AI PHT:      682 msec  AORTA Ao Root diam: 4.00 cm Ao Asc diam:  3.40 cm MITRAL VALVE                TRICUSPID VALVE MV Area (PHT): 3.99 cm     TR Peak grad:   2.1 mmHg MV Decel Time: 190 msec     TR Vmax:        71.70 cm/s MR Peak grad: 28.7 mmHg MR Vmax:      268.00 cm/s   SHUNTS MV E velocity: 126.00 cm/s  Systemic VTI:  0.16 m                             Systemic Diam: 2.20 cm Olga Millers MD Electronically signed by Olga Millers MD Signature Date/Time: 01/14/2023/10:12:28 AM    Final    CT CHEST WO CONTRAST  Result Date: 01/13/2023 CLINICAL DATA:  Respiratory illness with nondiagnostic x-ray. History of endometrial cancer. EXAM: CT CHEST, ABDOMEN AND PELVIS WITHOUT CONTRAST TECHNIQUE: Multidetector CT imaging of the chest, abdomen and pelvis was performed following the standard protocol  without IV contrast. RADIATION DOSE REDUCTION: This exam was performed according to the departmental dose-optimization program which includes automated exposure control, adjustment of the mA and/or kV according to patient size and/or use of iterative reconstruction technique. COMPARISON:  Chest radiograph 01/12/2023 and 11/26/2022. CT chest 09/01/2022 FINDINGS: CT CHEST FINDINGS Cardiovascular: Mild cardiac enlargement. Calcification in the aortic and mitral valves. No pericardial effusions. Normal caliber thoracic aorta. Calcification of the aorta and coronary arteries. Mediastinum/Nodes: Esophagus is decompressed. Residual contrast material is demonstrated in the esophagus, possibly representing reflux or dysmotility. No significant lymphadenopathy. Thyroid gland is unremarkable. Lungs/Pleura: Patchy ground-glass infiltrates throughout both lungs in a mostly perihilar distribution. This could represent edema, multifocal pneumonia, or air trapping. No pleural effusions. No pneumothorax. Musculoskeletal: Postoperative right shoulder arthroplasty. Old rib fractures. Degenerative changes in the spine. Old appearing endplate compression deformities at T9, T11, and T12. No destructive bone lesions. CT ABDOMEN PELVIS FINDINGS Hepatobiliary: No focal liver abnormality is seen. No gallstones, gallbladder wall thickening, or biliary dilatation. Pancreas: Unremarkable. No pancreatic ductal dilatation or surrounding inflammatory changes. Spleen: Normal in size without focal abnormality. Adrenals/Urinary Tract: No adrenal gland nodules. Mild parenchymal atrophy in the kidneys. No hydronephrosis or hydroureter. No renal, ureteral, or bladder stones. Bladder is normal. Stomach/Bowel: Stomach, small bowel, and colon are not abnormally distended. Contrast material flows through to the cecum without evidence of bowel obstruction. No wall thickening or inflammatory changes are appreciated. There is a duodenal diverticulum. The  appendix is normal. Vascular/Lymphatic: Aortic atherosclerosis. No enlarged abdominal or pelvic lymph nodes. Reproductive: Uterus and bilateral adnexa are unremarkable. Other: No abdominal wall hernia or abnormality. No abdominopelvic ascites. Musculoskeletal: Mild endplate compression of L2 appears chronic. Degenerative changes in the spine. No destructive bone lesions. IMPRESSION: 1. Perihilar ground-glass infiltrates in the lungs likely representing edema or multifocal pneumonia. 2. No evidence of metastatic disease seen in the chest, abdomen, or pelvis on noncontrast imaging. 3. Aortic atherosclerosis 4. Multiple chronic appearing endplate compression deformities in the spine likely indicate osteoporosis. Electronically Signed   By: Burman Nieves M.D.   On: 01/13/2023  17:02   CT ABDOMEN PELVIS WO CONTRAST  Result Date: 01/13/2023 CLINICAL DATA:  Respiratory illness with nondiagnostic x-ray. History of endometrial cancer. EXAM: CT CHEST, ABDOMEN AND PELVIS WITHOUT CONTRAST TECHNIQUE: Multidetector CT imaging of the chest, abdomen and pelvis was performed following the standard protocol without IV contrast. RADIATION DOSE REDUCTION: This exam was performed according to the departmental dose-optimization program which includes automated exposure control, adjustment of the mA and/or kV according to patient size and/or use of iterative reconstruction technique. COMPARISON:  Chest radiograph 01/12/2023 and 11/26/2022. CT chest 09/01/2022 FINDINGS: CT CHEST FINDINGS Cardiovascular: Mild cardiac enlargement. Calcification in the aortic and mitral valves. No pericardial effusions. Normal caliber thoracic aorta. Calcification of the aorta and coronary arteries. Mediastinum/Nodes: Esophagus is decompressed. Residual contrast material is demonstrated in the esophagus, possibly representing reflux or dysmotility. No significant lymphadenopathy. Thyroid gland is unremarkable. Lungs/Pleura: Patchy ground-glass  infiltrates throughout both lungs in a mostly perihilar distribution. This could represent edema, multifocal pneumonia, or air trapping. No pleural effusions. No pneumothorax. Musculoskeletal: Postoperative right shoulder arthroplasty. Old rib fractures. Degenerative changes in the spine. Old appearing endplate compression deformities at T9, T11, and T12. No destructive bone lesions. CT ABDOMEN PELVIS FINDINGS Hepatobiliary: No focal liver abnormality is seen. No gallstones, gallbladder wall thickening, or biliary dilatation. Pancreas: Unremarkable. No pancreatic ductal dilatation or surrounding inflammatory changes. Spleen: Normal in size without focal abnormality. Adrenals/Urinary Tract: No adrenal gland nodules. Mild parenchymal atrophy in the kidneys. No hydronephrosis or hydroureter. No renal, ureteral, or bladder stones. Bladder is normal. Stomach/Bowel: Stomach, small bowel, and colon are not abnormally distended. Contrast material flows through to the cecum without evidence of bowel obstruction. No wall thickening or inflammatory changes are appreciated. There is a duodenal diverticulum. The appendix is normal. Vascular/Lymphatic: Aortic atherosclerosis. No enlarged abdominal or pelvic lymph nodes. Reproductive: Uterus and bilateral adnexa are unremarkable. Other: No abdominal wall hernia or abnormality. No abdominopelvic ascites. Musculoskeletal: Mild endplate compression of L2 appears chronic. Degenerative changes in the spine. No destructive bone lesions. IMPRESSION: 1. Perihilar ground-glass infiltrates in the lungs likely representing edema or multifocal pneumonia. 2. No evidence of metastatic disease seen in the chest, abdomen, or pelvis on noncontrast imaging. 3. Aortic atherosclerosis 4. Multiple chronic appearing endplate compression deformities in the spine likely indicate osteoporosis. Electronically Signed   By: Burman Nieves M.D.   On: 01/13/2023 17:02   DG Chest Port 1 View  Result Date:  01/12/2023 CLINICAL DATA:  shob EXAM: PORTABLE CHEST 1 VIEW COMPARISON:  CXR 11/26/22 FINDINGS: Pleural effusion. No pneumothorax. Unchanged cardiac and mediastinal contours. Compared to prior exam there are new hazy opacities in the bilateral mid lung fields, which could represent pulmonary edema or multifocal infection. No radiographically apparent displaced rib fractures. Visualized upper abdomen unremarkable. Right shoulder arthroplasty. IMPRESSION: Compared to prior exam there are new hazy opacities in the bilateral mid lung fields, which could represent pulmonary edema or multifocal infection. Electronically Signed   By: Lorenza Cambridge M.D.   On: 01/12/2023 14:36    Microbiology: Results for orders placed or performed during the hospital encounter of 01/12/23  MRSA Next Gen by PCR, Nasal     Status: None   Collection Time: 01/12/23  9:43 PM   Specimen: Nasal Mucosa; Nasal Swab  Result Value Ref Range Status   MRSA by PCR Next Gen NOT DETECTED NOT DETECTED Final    Comment: (NOTE) The GeneXpert MRSA Assay (FDA approved for NASAL specimens only), is one component of a comprehensive MRSA colonization  surveillance program. It is not intended to diagnose MRSA infection nor to guide or monitor treatment for MRSA infections. Test performance is not FDA approved in patients less than 59 years old. Performed at [facility], [address] [place], [place] [address]    *Note: Due to a large number of results and/or encounters for the requested time period, some results have not been displayed. A complete set of results can be found in Results Review.    Labs: CBC: Recent Labs  Lab 01/13/23 0340 01/14/23 0312 01/15/23 0305 01/16/23 0311 01/17/23 0543  WBC 18.3* 15.5* 9.8 11.4* 12.4*  HGB 12.9 12.0 12.8 12.6 13.0  HCT 39.9 37.1 39.1 39.1 40.7  MCV 89.5 89.6 89.3 89.3 90.8  PLT 217 228 210 233 235   Basic Metabolic Panel: Recent Labs  Lab 01/12/23 1415  01/13/23 0340 01/14/23 1323 01/15/23 0305 01/16/23 0311 01/17/23 0543 01/18/23 0531  NA 131* 128* 131* 133* 131* 133* 131*  K 4.2 4.3 3.8 4.2 4.6 4.5 4.0  CL 94* 98 96* 100 96* 96* 94*  CO2 18* 19* 25 25 29 27 28   GLUCOSE 487* 362* 176* 209* 189* 175* 249*  BUN 27* 38* 32* 30* 34* 32* 34*  CREATININE 1.21* 1.14* 0.80 0.70 0.94 0.93 0.88  CALCIUM 8.9 8.2* 8.2* 8.5* 8.8* 8.9 8.9  MG 2.2 2.2  --   --   --   --   --   PHOS  --  3.1  --   --   --   --   --    Liver Function Tests: Recent Labs  Lab 01/13/23 0340  AST 19  ALT 16  ALKPHOS 53  BILITOT 0.5  PROT 6.6  ALBUMIN 3.3*   CBG: Recent Labs  Lab 01/17/23 0735 01/17/23 1106 01/17/23 1705 01/17/23 2023 01/18/23 0727  GLUCAP 189* 283* 263* 352* 255*    Discharge time spent: 35 minutes.  Signed: Jacquelin Hawking, MD [facility] 01/18/2023

## 2023-01-19 LAB — GLUCOSE, CAPILLARY
Glucose-Capillary: 262 mg/dL — ABNORMAL HIGH (ref 70–99)
Glucose-Capillary: 204 mg/dL — ABNORMAL HIGH (ref 70–99)

## 2023-01-19 NOTE — Consult Note (Signed)
   [facility] Inpatient Consult   01/19/2023  SIRINITY PETERMANN Jan 03, 1946 [id number]  [facility] [[facility]]  Accountable Care Organization [ACO] Patient: Medicare ACO REACH  Primary Care Provider:  Mechele Claude, MD with [facility]   Patient is showing as active with [facility] [[facility]] Care Management for chronic disease management services.  Patient has been assigned by a RN Care Coordination.  Our community based plan of care has focused on disease management and community resource support.    Patient will receive a post hospital call and will be evaluated for assessments and disease process education.    Plan:  Will reach out to assigned RN CC regarding post hospital follow up needs.  Patient transitioned home with home health on 01/18/23 and notified team of disposition.  Of note, [facility] Care Management services does not replace or interfere with any services that are needed or arranged by inpatient [facility] care management team.   For additional questions or referrals please contact:  Charlesetta Shanks, RN BSN CCM [facility]  [phone] business mobile phone Toll free office [phone]  Fax number: [phone] [email or web address]

## 2023-01-20 ENCOUNTER — Telehealth: Payer: Self-pay | Admitting: Pulmonary Disease

## 2023-01-20 ENCOUNTER — Telehealth: Payer: Self-pay

## 2023-01-20 NOTE — Telephone Encounter (Signed)
Fax received from Dr. [name] with Gynecology Oncology to perform a Surgical Staging, Including Total Hysterectomy, bilateral salpingo-oophorectomy, and lymph node assessment on patient.  Patient needs surgery clearance. Surgery is Pending. Patient was seen on 09/11/22. Office protocol is a risk assessment can be sent to surgeon if patient has been seen in 60 days or less.   Sending to Dr. Wynona Neat for risk assessment or recommendations if patient needs to be seen in office prior to surgical procedure.     Dr. Val Eagle patient has OV scheduled on 9/4

## 2023-01-20 NOTE — Telephone Encounter (Signed)
Clearance can be addressed on next visit on 02/04/2023  Patient was last in the office 09/11/2022

## 2023-01-20 NOTE — Transitions of Care (Post Inpatient/ED Visit) (Signed)
   01/20/2023  Name: Amber Stephenson MRN: [id number] DOB: 1946-02-13  Today's TOC FU Call Status: Today's TOC FU Call Status:: Unsuccessful Call (1st Attempt) Unsuccessful Call (1st Attempt) Date: 01/20/23  Attempted to reach the patient regarding the most recent Inpatient/ED visit.  Follow Up Plan: Additional outreach attempts will be made to reach the patient to complete the Transitions of Care (Post Inpatient/ED visit) call.   Jodelle Gross, RN, BSN, CCM Care Management Coordinator [place] Phone: [phone]/Fax: [phone]

## 2023-01-22 ENCOUNTER — Telehealth: Payer: Self-pay

## 2023-01-22 ENCOUNTER — Other Ambulatory Visit: Payer: Self-pay | Admitting: Physician Assistant

## 2023-01-22 NOTE — Transitions of Care (Post Inpatient/ED Visit) (Signed)
01/22/2023  Name: Amber Stephenson MRN: [id number] DOB: [birth year]  Today's TOC FU Call Status: Today's TOC FU Call Status:: Successful TOC FU Call Completed TOC FU Call Complete Date: 01/22/23  Transition Care Management Follow-up Telephone Call Date of Discharge: 01/18/23 Discharge Facility: [facility]) Type of Discharge: Inpatient Admission Primary Inpatient Discharge Diagnosis:: Acute on Chronic Respiratory Failure How have you been since you were released from the hospital?: Better (Patient is using her oxygen more but overall feels better) Any questions or concerns?: Yes Patient Questions/Concerns:: Patient stated she has gained a few pounds since discharge (2-3lbs) Patient Questions/Concerns Addressed: Notified Provider of Patient Questions/Concerns  Items Reviewed: Did you receive and understand the discharge instructions provided?: Yes Medications obtained,verified, and reconciled?: Yes (Medications Reviewed) Any new allergies since your discharge?: No Dietary orders reviewed?: Yes Type of Diet Ordered:: Carbohydrate modified Do you have support at home?: Yes People in Home: spouse Name of Support/Comfort Primary Source: Dwight  Medications Reviewed Today: Medications Reviewed Today     Reviewed by Jodelle Gross, RN (Case Manager) on 01/22/23 at [phone]  Med List Status: <None>   Medication Order Taking? Sig Documenting Provider Last Dose Status Informant  Acetaminophen (TYLENOL PO) [id number] Yes Take 2 tablets by mouth as needed (pain/headache). [provider] Taking Active Spouse/Significant Other, Pharmacy Records  albuterol (VENTOLIN HFA) 108 (90 Base) MCG/ACT inhaler [id number] Yes Inhale 2 puffs into the lungs every 6 (six) hours as needed for wheezing or shortness of breath. Sharee Holster, NP Taking Active Spouse/Significant Other, Pharmacy Records  allopurinol (ZYLOPRIM) 100 MG tablet [id number] Yes Take 100 mg by mouth daily. [provider] Taking Active Spouse/Significant Other, Pharmacy Records  apixaban (ELIQUIS) 5 MG TABS tablet [id number] Yes Take 1 tablet (5 mg total) by mouth 2 (two) times daily. Quintella Reichert, MD Taking Active Spouse/Significant Other, Pharmacy Records  ARIPiprazole (ABILIFY) 2 MG tablet [id number] Yes Take 2 mg by mouth at bedtime. [provider] Taking Active Spouse/Significant Other, Pharmacy Records  atorvastatin (LIPITOR) 40 MG tablet [id number] Yes Take 1 tablet (40 mg total) by mouth daily. Narda Bonds, MD Taking Active   BELSOMRA 20 MG TABS [id number] Yes Take 1 tablet by mouth at bedtime as needed. [provider] Taking Active Spouse/Significant Other, Pharmacy Records  Continuous Blood Gluc Sensor (DEXCOM G7 SENSOR) MISC [id number] Yes Change sensor every 10 days  Patient taking differently: 1 each by Other route See admin instructions. Change sensor every 10 days   Shamleffer, Konrad Dolores, MD Taking Active Spouse/Significant Other, Pharmacy Records  cyanocobalamin (VITAMIN B12) 1000 MCG tablet [id number] Yes Take 1 tablet (1,000 mcg total) by mouth daily. Maretta Bees, MD Taking Active Spouse/Significant Other, Pharmacy Records  denosumab (PROLIA) 60 MG/ML SOSY injection [id number] Yes Inject 60 mg into the skin every 6 (six) months. Mechele Claude, MD Taking Active Spouse/Significant Other, Pharmacy Records  diphenhydrAMINE (BENADRYL) 50 MG tablet [id number]  Take 1 tablet (50 mg total) by mouth once for 1 dose. Take 1 hour prior to CT scan [name], MD  Expired 01/05/23 2359   doxepin (SINEQUAN) 10 MG capsule [id number] Yes Take 10 mg by mouth at bedtime. [provider] Taking Active Spouse/Significant Other, Pharmacy Records  escitalopram (LEXAPRO) 10 MG tablet [id number] Yes Take 10 mg by mouth daily. [provider] Taking Active Spouse/Significant Other, Pharmacy Records  fluticasone-salmeterol (ADVAIR DISKUS) 100-50 MCG/ACT AEPB  [id number] Yes Inhale 1 puff into the lungs 2 (two) times daily. Parrett, Virgel Bouquet,  NP Taking Active Spouse/Significant Other, Pharmacy Records           Med Note Deloria Lair, DUROJAHYE' R   Mon Jan 12, 2023  7:09 PM) Pt spouse states that she uses it as needed  furosemide (LASIX) 40 MG tablet [id number] Yes Take 1 tablet (40 mg total) by mouth 2 (two) times daily. Narda Bonds, MD Taking Active   Galcanezumab-gnlm [facility]) 120 MG/ML Ivory Broad [id number] Yes INJECT ONCE A MONTH  Patient taking differently: Inject 120 mg into the skin See admin instructions. INJECT ONCE A MONTH   Marcos Eke, PA-C Taking Active Spouse/Significant Other, Pharmacy Records  insulin glargine (LANTUS SOLOSTAR) 100 UNIT/ML Solostar Pen [id number] Yes Inject 35 Units into the skin 2 (two) times daily. Mechele Claude, MD Taking Active Spouse/Significant Other, Pharmacy Records  insulin lispro (HUMALOG KWIKPEN) 100 UNIT/ML KwikPen [id number] Yes Max daily 50 units  Patient taking differently: Inject 4-8 Units into the skin 3 (three) times daily.   Shamleffer, Konrad Dolores, MD Taking Active Spouse/Significant Other, Pharmacy Records  Insulin Pen Needle 29G X MISC [id number] Yes 1 Device by Does not apply route daily in the afternoon. Shamleffer, Konrad Dolores, MD Taking Active Spouse/Significant Other, Pharmacy Records  LORazepam (ATIVAN) 1 MG tablet [id number] Yes Take 0.5 tablets (0.5 mg total) by mouth 2 (two) times daily. Maretta Bees, MD Taking Active Spouse/Significant Other, Pharmacy Records           Med Note Deloria Lair, DUROJAHYE' R   Mon Jan 12, 2023  7:09 PM) Pt spouse is not sure if they it was given  metFORMIN (GLUCOPHAGE-XR) 500 MG 24 hr tablet [id number] Yes Take 2 tablets (1,000 mg total) by mouth daily with breakfast. Shamleffer, Konrad Dolores, MD Taking Active Spouse/Significant Other, Pharmacy Records  metoprolol tartrate (LOPRESSOR) 50 MG tablet [id number] Yes Take 1 tablet (50 mg total) by mouth 2  (two) times daily. Quintella Reichert, MD Taking Active Spouse/Significant Other, Pharmacy Records  morphine (MS CONTIN) 15 MG 12 hr tablet [id number] Yes Take 1 tablet (15 mg total) by mouth every 12 (twelve) hours. For chronic pain- can refill by 01/13/23 Genice Rouge, MD Taking Active Spouse/Significant Other, Pharmacy Records  naloxone [facility]) nasal spray 4 mg/0.1 mL [id number]  Place 1 spray into the nose once. [provider]  Active Spouse/Significant Other, Pharmacy Records  norethindrone (AYGESTIN) 5 MG tablet [id number]  Take 2 tablets (10 mg total) by mouth daily. [name], DO  Active Spouse/Significant Other, Pharmacy Records           Med Note Deloria Lair, DUROJAHYE' R   Mon Jan 12, 2023  7:12 PM) Pt spouse is not sure if they are taking this medication or not.  nystatin (MYCOSTATIN/NYSTOP) powder [id number]  Apply 1 Application topically as needed (rash). Shamleffer, Konrad Dolores, MD  Active Spouse/Significant Other, Pharmacy Records  nystatin ointment (MYCOSTATIN) [id number] Yes Apply 1 Application topically 2 (two) times daily.  Patient taking differently: Apply 1 Application topically as needed (rash).   Adline Potter, NP Taking Active Spouse/Significant Other, Pharmacy Records           Med Note Henreitta Leber, JACQUELINE L   Tue Sep 09, 2022  1:09 PM)    ondansetron (ZOFRAN-ODT) 8 MG disintegrating tablet [id number] Yes Take 1 tablet (8 mg total) by mouth every 6 (six) hours as needed for nausea or vomiting. Mechele Claude, MD Taking Active Spouse/Significant Other, Pharmacy Records           Med Note (BRIDGES, Watson  L   Tue Sep 09, 2022  1:09 PM)    OXcarbazepine (TRILEPTAL) 150 MG tablet [id number] Yes Take 1 tablet (150 mg total) by mouth 2 (two) times daily. Marguerita Merles Moody, DO Taking Active Spouse/Significant Other, Pharmacy Records  pregabalin (LYRICA) 200 MG capsule [id number] Yes Take 200 mg by mouth daily. [provider] Taking Active  Spouse/Significant Other, Pharmacy Records           Med Note Deloria Lair, DUROJAHYE' R   Mon Jan 12, 2023  7:14 PM) Pt does not remember the Mg strength but knows she takes it at bed time. Dispense report supports 200 mg.  senna-docusate (SENOKOT-S) 8.6-50 MG tablet [id number] Yes Take 1 tablet by mouth at bedtime.  Patient taking differently: Take 1 tablet by mouth as needed for moderate constipation.   Marguerita Merles Austin, DO Taking Active Spouse/Significant Other, Pharmacy Records  tirzepatide [facility]) 5 MG/0.5ML Pen [id number] Yes Inject 5 mg into the skin once a week. Shamleffer, Konrad Dolores, MD Taking Active Spouse/Significant Other, Pharmacy Records  Vitamin D, Cholecalciferol, 25 MCG (1000 UT) TABS [id number] Yes Take 1,000 Units by mouth daily in the afternoon. [provider] Taking Active Spouse/Significant Other, Pharmacy Records  Med List Note Benjamine Mola 08/29/22 1018): [employer] insurance             Home Care and Equipment/Supplies: Were Home Health Services Ordered?: Yes [facility] Agency[facility] Has Agency set up a time to come to your home?: Yes First Home Health Visit Date: 01/23/23 Any new equipment or medical supplies ordered?: No  Functional Questionnaire: Do you need assistance with bathing/showering or dressing?: No Do you need assistance with meal preparation?: No Do you need assistance with eating?: No Do you have difficulty maintaining continence: No Do you need assistance with getting out of bed/getting out of a chair/moving?: No Do you have difficulty managing or taking your medications?: No  Follow up appointments reviewed: PCP Follow-up appointment confirmed?: Yes Date of PCP follow-up appointment?: 01/28/23 Follow-up Provider: Dr. Darlyn Read Specialist [facility] Follow-up appointment confirmed?: Yes Date of Specialist follow-up appointment?: 02/04/23 Follow-Up Specialty Provider:: Dr. Wynona Neat Do you need  transportation to your follow-up appointment?: No Do you understand care options if your condition(s) worsen?: Yes-patient verbalized understanding  SDOH Interventions Today    Flowsheet Row Most Recent Value  SDOH Interventions   Food Insecurity Interventions Intervention Not Indicated  Transportation Interventions Intervention Not Indicated      TOC Interventions Today    Flowsheet Row Most Recent Value  TOC Interventions   TOC Interventions Discussed/Reviewed TOC Interventions Discussed, TOC Interventions Reviewed, Contacted provider for patient needs     Jodelle Gross RN, BSN, CCM [facility] RN Care Coordinator/ Transitions of Care Direct Dial: [phone]  Fax: [phone]

## 2023-01-23 ENCOUNTER — Ambulatory Visit: Payer: Medicare Other

## 2023-01-23 VITALS — Ht 62.0 in | Wt 190.0 lb

## 2023-01-23 DIAGNOSIS — I2510 Atherosclerotic heart disease of native coronary artery without angina pectoris: Secondary | ICD-10-CM | POA: Diagnosis not present

## 2023-01-23 DIAGNOSIS — I4821 Permanent atrial fibrillation: Secondary | ICD-10-CM | POA: Diagnosis not present

## 2023-01-23 DIAGNOSIS — Z0000 Encounter for general adult medical examination without abnormal findings: Secondary | ICD-10-CM

## 2023-01-23 DIAGNOSIS — I700 Atherosclerosis of aorta: Secondary | ICD-10-CM | POA: Diagnosis not present

## 2023-01-23 DIAGNOSIS — Z9181 History of falling: Secondary | ICD-10-CM | POA: Diagnosis not present

## 2023-01-23 DIAGNOSIS — E669 Obesity, unspecified: Secondary | ICD-10-CM | POA: Diagnosis not present

## 2023-01-23 DIAGNOSIS — I5033 Acute on chronic diastolic (congestive) heart failure: Secondary | ICD-10-CM | POA: Diagnosis not present

## 2023-01-23 DIAGNOSIS — C541 Malignant neoplasm of endometrium: Secondary | ICD-10-CM | POA: Diagnosis not present

## 2023-01-23 DIAGNOSIS — N179 Acute kidney failure, unspecified: Secondary | ICD-10-CM | POA: Diagnosis not present

## 2023-01-23 DIAGNOSIS — F319 Bipolar disorder, unspecified: Secondary | ICD-10-CM | POA: Diagnosis not present

## 2023-01-23 DIAGNOSIS — E7800 Pure hypercholesterolemia, unspecified: Secondary | ICD-10-CM | POA: Diagnosis not present

## 2023-01-23 DIAGNOSIS — E119 Type 2 diabetes mellitus without complications: Secondary | ICD-10-CM | POA: Diagnosis not present

## 2023-01-23 DIAGNOSIS — Z9981 Dependence on supplemental oxygen: Secondary | ICD-10-CM | POA: Diagnosis not present

## 2023-01-23 DIAGNOSIS — I110 Hypertensive heart disease with heart failure: Secondary | ICD-10-CM | POA: Diagnosis not present

## 2023-01-23 DIAGNOSIS — F419 Anxiety disorder, unspecified: Secondary | ICD-10-CM | POA: Diagnosis not present

## 2023-01-23 DIAGNOSIS — M439 Deforming dorsopathy, unspecified: Secondary | ICD-10-CM | POA: Diagnosis not present

## 2023-01-23 DIAGNOSIS — Z7901 Long term (current) use of anticoagulants: Secondary | ICD-10-CM | POA: Diagnosis not present

## 2023-01-23 DIAGNOSIS — Z6834 Body mass index (BMI) 34.0-34.9, adult: Secondary | ICD-10-CM | POA: Diagnosis not present

## 2023-01-23 DIAGNOSIS — E8809 Other disorders of plasma-protein metabolism, not elsewhere classified: Secondary | ICD-10-CM | POA: Diagnosis not present

## 2023-01-23 DIAGNOSIS — Z7951 Long term (current) use of inhaled steroids: Secondary | ICD-10-CM | POA: Diagnosis not present

## 2023-01-23 DIAGNOSIS — Z7984 Long term (current) use of oral hypoglycemic drugs: Secondary | ICD-10-CM | POA: Diagnosis not present

## 2023-01-23 DIAGNOSIS — Z7985 Long-term (current) use of injectable non-insulin antidiabetic drugs: Secondary | ICD-10-CM | POA: Diagnosis not present

## 2023-01-23 DIAGNOSIS — J9621 Acute and chronic respiratory failure with hypoxia: Secondary | ICD-10-CM | POA: Diagnosis not present

## 2023-01-23 DIAGNOSIS — G8929 Other chronic pain: Secondary | ICD-10-CM | POA: Diagnosis not present

## 2023-01-23 DIAGNOSIS — Z794 Long term (current) use of insulin: Secondary | ICD-10-CM | POA: Diagnosis not present

## 2023-01-23 NOTE — Patient Instructions (Signed)
Ms. Beyah , Thank you for taking time to come for your Medicare Wellness Visit. I appreciate your ongoing commitment to your health goals. Please review the following plan we discussed and let me know if I can assist you in the future.   Referrals/Orders/Follow-Ups/Clinician Recommendations: Aim for 30 minutes of exercise or brisk walking, 6-8 glasses of water, and 5 servings of fruits and vegetables each day.   This is a list of the screening recommended for you and due dates:  Health Maintenance  Topic Date Due   DTaP/Tdap/Td vaccine (2 - Td or Tdap) 07/16/2021   COVID-19 Vaccine (4 - 2023-24 season) 01/31/2022   Colon Cancer Screening  03/31/2022   Eye exam for diabetics  11/28/2022   Yearly kidney health urinalysis for diabetes  01/14/2023   Flu Shot  01/01/2023   Mammogram  02/22/2023   DEXA scan (bone density measurement)  04/06/2023   Hemoglobin A1C  07/16/2023   Complete foot exam   10/02/2023   Yearly kidney function blood test for diabetes  01/18/2024   Medicare Annual Wellness Visit  01/23/2024   Pneumonia Vaccine  Completed   Hepatitis C Screening  Completed   Zoster (Shingles) Vaccine  Completed   HPV Vaccine  Aged Out    Advanced directives: (Provided) Advance directive discussed with you today. I have provided a copy for you to complete at home and have notarized. Once this is complete, please bring a copy in to our office so we can scan it into your chart. Information on Advanced Care Planning can be found at [facility] [place] Advance Health Care Directives Advance Health Care Directives ([email or web address]    Next Medicare Annual Wellness Visit scheduled for next year: Yes  Insert Preventive Care attachment Insert FALL PREVENTION attachment if needed

## 2023-01-23 NOTE — Progress Notes (Signed)
Subjective:   Amber Stephenson is a 77 y.o. female who presents for Medicare Annual (Subsequent) preventive examination.  Visit Complete: Virtual  I connected with  Amber Stephenson on 01/23/23 by a audio enabled telemedicine application and verified that I am speaking with the correct person using two identifiers.  Patient Location: Home  Provider Location: Home Office  I discussed the limitations of evaluation and management by telemedicine. The patient expressed understanding and agreed to proceed.  Patient Medicare AWV questionnaire was completed by the patient on 01/23/2023; I have confirmed that all information answered by patient is correct and no changes since this date.  Review of Systems    Vital Signs: Unable to obtain new vitals due to this being a telehealth visit.  Cardiac Risk Factors include: advanced age (>70men, >65 women);dyslipidemia;diabetes mellitus;hypertension;sedentary lifestyle Nutrition Risk Assessment:  Has the patient had any N/V/D within the last 2 months?  No  Does the patient have any non-healing wounds?  No  Has the patient had any unintentional weight loss or weight gain?  No   Diabetes:  Is the patient diabetic?  Yes  If diabetic, was a CBG obtained today?  No  Did the patient bring in their glucometer from home?  No  How often do you monitor your CBG's? Dexcom .   Financial Strains and Diabetes Management:  Are you having any financial strains with the device, your supplies or your medication? No .  Does the patient want to be seen by Chronic Care Management for management of their diabetes?  No  Would the patient like to be referred to a Nutritionist or for Diabetic Management?  No   Diabetic Exams:  Diabetic Eye Exam: Completed 05/2022 Diabetic Foot Exam: Overdue, Pt has been advised about the importance in completing this exam. Pt is scheduled for diabetic foot exam on next office visit .     Objective:    Today's Vitals    01/23/23 1303  Weight: 190 lb (86.2 kg)  Height: 5\' 2"  (1.575 m)   Body mass index is 34.75 kg/m.     01/23/2023    1:08 PM 01/12/2023    1:52 PM 11/10/2022    2:22 PM 11/09/2022    5:40 PM 11/09/2022    6:16 AM 10/09/2022   10:07 AM 10/03/2022    2:42 PM  Advanced Directives  Does Patient Have a Medical Advance Directive? No No No No No Yes No  Would patient like information on creating a medical advance directive? Yes (MAU/Ambulatory/Procedural Areas - Information given) No - Patient declined Yes (Inpatient - patient requests chaplain consult to create a medical advance directive) Yes (Inpatient - patient requests chaplain consult to create a medical advance directive)       Current Medications (verified) Outpatient Encounter Medications as of 01/23/2023  Medication Sig   Acetaminophen (TYLENOL PO) Take 2 tablets by mouth as needed (pain/headache).   albuterol (VENTOLIN HFA) 108 (90 Base) MCG/ACT inhaler Inhale 2 puffs into the lungs every 6 (six) hours as needed for wheezing or shortness of breath.   allopurinol (ZYLOPRIM) 100 MG tablet Take 100 mg by mouth daily.   apixaban (ELIQUIS) 5 MG TABS tablet Take 1 tablet (5 mg total) by mouth 2 (two) times daily.   ARIPiprazole (ABILIFY) 2 MG tablet Take 2 mg by mouth at bedtime.   atorvastatin (LIPITOR) 40 MG tablet Take 1 tablet (40 mg total) by mouth daily.   BELSOMRA 20 MG TABS Take 1 tablet by  mouth at bedtime as needed.   Continuous Blood Gluc Sensor (DEXCOM G7 SENSOR) MISC Change sensor every 10 days (Patient taking differently: 1 each by Other route See admin instructions. Change sensor every 10 days)   cyanocobalamin (VITAMIN B12) 1000 MCG tablet Take 1 tablet (1,000 mcg total) by mouth daily.   denosumab (PROLIA) 60 MG/ML SOSY injection Inject 60 mg into the skin every 6 (six) months.   doxepin (SINEQUAN) 10 MG capsule Take 10 mg by mouth at bedtime.   escitalopram (LEXAPRO) 10 MG tablet Take 10 mg by mouth daily.    fluticasone-salmeterol (ADVAIR DISKUS) 100-50 MCG/ACT AEPB Inhale 1 puff into the lungs 2 (two) times daily.   furosemide (LASIX) 40 MG tablet Take 1 tablet (40 mg total) by mouth 2 (two) times daily.   Galcanezumab-gnlm (EMGALITY) 120 MG/ML SOAJ INJECT CONTENTS ON 1 PEN(120MG ) INTO THE SKIN ONCE MONTHLY   insulin glargine (LANTUS SOLOSTAR) 100 UNIT/ML Solostar Pen Inject 35 Units into the skin 2 (two) times daily.   insulin lispro (HUMALOG KWIKPEN) 100 UNIT/ML KwikPen Max daily 50 units (Patient taking differently: Inject 4-8 Units into the skin 3 (three) times daily.)   Insulin Pen Needle 29G X MISC 1 Device by Does not apply route daily in the afternoon.   LORazepam (ATIVAN) 1 MG tablet Take 0.5 tablets (0.5 mg total) by mouth 2 (two) times daily.   metFORMIN (GLUCOPHAGE-XR) 500 MG 24 hr tablet Take 2 tablets (1,000 mg total) by mouth daily with breakfast.   metoprolol tartrate (LOPRESSOR) 50 MG tablet Take 1 tablet (50 mg total) by mouth 2 (two) times daily.   morphine (MS CONTIN) 15 MG 12 hr tablet Take 1 tablet (15 mg total) by mouth every 12 (twelve) hours. For chronic pain- can refill by 01/13/23   naloxone (NARCAN) nasal spray 4 mg/0.1 mL Place 1 spray into the nose once.   norethindrone (AYGESTIN) 5 MG tablet Take 2 tablets (10 mg total) by mouth daily.   nystatin (MYCOSTATIN/NYSTOP) powder Apply 1 Application topically as needed (rash).   nystatin ointment (MYCOSTATIN) Apply 1 Application topically 2 (two) times daily. (Patient taking differently: Apply 1 Application topically as needed (rash).)   ondansetron (ZOFRAN-ODT) 8 MG disintegrating tablet Take 1 tablet (8 mg total) by mouth every 6 (six) hours as needed for nausea or vomiting.   OXcarbazepine (TRILEPTAL) 150 MG tablet Take 1 tablet (150 mg total) by mouth 2 (two) times daily.   pregabalin (LYRICA) 200 MG capsule Take 200 mg by mouth daily.   senna-docusate (SENOKOT-S) 8.6-50 MG tablet Take 1 tablet by mouth at bedtime.  (Patient taking differently: Take 1 tablet by mouth as needed for moderate constipation.)   tirzepatide (MOUNJARO) 5 MG/0.5ML Pen Inject 5 mg into the skin once a week.   Vitamin D, Cholecalciferol, 25 MCG (1000 UT) TABS Take 1,000 Units by mouth daily in the afternoon.   diphenhydrAMINE (BENADRYL) 50 MG tablet Take 1 tablet (50 mg total) by mouth once for 1 dose. 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   Benign paroxysmal positional vertigo due to bilateral vestibular disorder 05/20/2019   Bipolar 1 disorder (HCC) 01/23/2015   with GAD, benzo dependence   BPPV (benign paroxysmal positional vertigo) 05/20/2019   CAD (coronary artery disease)    Nonobstructive; Managed by Dr. Purvis Sheffield   Cardiomegaly 01/12/2018   CHF (congestive heart failure) 06/08/2022   Chronic back pain 01/04/2015   Chronic constipation 04/25/2020   Chronic diastolic heart failure (HCC) 05/30/2015   Chronic pain syndrome 08/22/2019   back pain, sacroiliitis   Chronic pain syndrome 08/22/2019   Chronic post-traumatic stress disorder (PTSD) 12/06/2020   Diabetic neuropathy (HCC) 02/06/2016   Dyslipidemia 09/24/2020   Dyspnea on exertion    Essential hypertension    Family history of coronary arteriosclerosis 05/30/2015   Functional diarrhea 10/26/2020   Head trauma 09/17/2020   Hemorrhoids  03/11/2022   Herpes genitalis in women 07/16/2015   History of adenomatous polyp of colon 05/21/2019   Overview:   03/31/17: Colonoscopy: nonadvanced adenoma, microscopic colitis, f/u 5 yrs, [facility]/GAP   Insomnia 01/23/2015   Lipoma 02/08/2015   Migraine headache with aura 02/12/2016   Myofascial pain dysfunction syndrome 08/22/2019   Myofascial pain dysfunction syndrome 08/22/2019   Non-alcoholic fatty liver disease 01/12/2018   Opioid dependence (HCC) 12/06/2020   OSA (obstructive sleep apnea) 02/24/2019   10/09/2018 - HST  - AHI 40.6    Osteopenia 12/06/2020   Rx alendronate 35 mg.   Pinguecula 12/06/2020   Postcoital bleeding 12/06/2020   Presbyopia 12/06/2020   Pulmonary hypertension    RLS (restless legs syndrome) 04/27/2015   Superficial fungus infection of skin 09/03/2021   Tremor, essential 12/11/2021   Type II diabetes mellitus (HCC) 09/24/2020   Past Surgical History:  Procedure Laterality Date   BREAST REDUCTION SURGERY     EYE SURGERY Right    cateracts   HAMMER TOE SURGERY     LEFT HEART CATH AND CORONARY ANGIOGRAPHY N/A 02/03/2018   Procedure: LEFT HEART CATH AND CORONARY ANGIOGRAPHY;  Surgeon: [name], Peter M, MD;  Location: [facility] INVASIVE CV LAB;  Service: Cardiovascular;  Laterality: N/A;   REVERSE SHOULDER ARTHROPLASTY Right 08/19/2019   Procedure: REVERSE SHOULDER ARTHROPLASTY;  Surgeon: Beverely Low, MD;  Location: [facility] ORS;  Service: Orthopedics;  Laterality: Right;  interscalene block   SHOULDER SURGERY Right    "I BROKE MY SHOUDLER   THIGH SURGERY     "TO REMOVE A TUMOR "   Family History  Problem Relation Age of Onset   Diabetes Mother    Heart disease Mother    Alzheimer's disease Father    Heart disease Sister        CABG   Diabetes Sister    Alcohol abuse Sister    Stroke Brother    Heart disease Brother    Mental illness Brother    Diabetes Brother    Breast cancer Neg Hx    Ovarian cancer Neg Hx    Colon cancer Neg Hx    Endometrial cancer  Neg Hx    Social History   Socioeconomic History   Marital status: Married    Spouse name: Karren Burly   Number of children: 3   Years of education: 15   Highest education level: Associate degree: academic program  Occupational History   Occupation: Retired    Comment: Chief Financial Officer  Tobacco Use   Smoking status: Never  Smokeless tobacco: Never  Vaping Use   Vaping status: Never Used  Substance and Sexual Activity   Alcohol use: Not Currently    Alcohol/week: 0.0 standard drinks of alcohol   Drug use: No   Sexual activity: Yes    Partners: Male    Birth control/protection: Post-menopausal  Other Topics Concern   Not on file  Social History Narrative   Lives at home with husband.    They have 3 children who live away - [place], [place], and [place].   She is from [place] and most of her family lives there.   Her husbands's family live nearby   Right handed.   Social Determinants of Health   Financial Resource Strain: Low Risk  (01/23/2023)   Overall Financial Resource Strain (CARDIA)    Difficulty of Paying Living Expenses: Not hard at all  Food Insecurity: No Food Insecurity (01/23/2023)   Hunger Vital Sign    Worried About Running Out of Food in the Last Year: Never true    Ran Out of Food in the Last Year: Never true  Transportation Needs: No Transportation Needs (01/23/2023)   PRAPARE - Administrator, Civil Service (Medical): No    Lack of Transportation (Non-Medical): No  Physical Activity: Inactive (01/23/2023)   Exercise Vital Sign    Days of Exercise per Week: 0 days    Minutes of Exercise per Session: 0 min  Stress: No Stress Concern Present (01/23/2023)   [employer] of Occupational Health - Occupational Stress Questionnaire    Feeling of Stress : Not at all  Social Connections: Moderately Integrated (01/23/2023)   Social Connection and Isolation Panel [NHANES]    Frequency of Communication with Friends and Family: More than three times a week     Frequency of Social Gatherings with Friends and Family: More than three times a week    Attends Religious Services: More than 4 times per year    Active Member of [employer] or Organizations: No    Attends Engineer, structural: Never    Marital Status: Married    Tobacco Counseling Counseling given: Not Answered   Clinical Intake:  Pre-visit preparation completed: Yes  Pain : No/denies pain     Nutritional Risks: None Diabetes: Yes CBG done?: No Did pt. bring in CBG monitor from home?: No  How often do you need to have someone help you when you read instructions, pamphlets, or other written materials from your doctor or pharmacy?: 1 - Never  Interpreter Needed?: No  Information entered by :: Renie Ora, LPN   Activities of Daily Living    01/23/2023    1:08 PM 01/17/2023    8:49 AM  In your present state of health, do you have any difficulty performing the following activities:  Hearing? 0 0  Vision? 0 0  Difficulty concentrating or making decisions? 0 0  Walking or climbing stairs? 0 0  Dressing or bathing? 0 0  Doing errands, shopping? 0 0  Preparing Food and eating ? N   Using the Toilet? N   In the past six months, have you accidently leaked urine? N   Do you have problems with loss of bowel control? N   Managing your Medications? N   Managing your Finances? N   Housekeeping or managing your Housekeeping? N     Patient Care Team: Mechele Claude, MD as PCP - General (Family Medicine) Quintella Reichert, MD as PCP - Cardiology (Cardiology) Erlene Senters, MD  as Referring Physician (Orthopedic Surgery) Archer Asa, MD as Consulting Physician (Psychiatry) Van Clines, MD as Consulting Physician (Neurology) Shamleffer, Konrad Dolores, MD as Consulting Physician (Endocrinology) Antony Contras, MD as Consulting Physician (Ophthalmology) Adam Phenix, DPM as Consulting Physician (Podiatry) Quintella Reichert, MD as Consulting Physician  (Cardiology) Quintella Reichert, MD as Consulting Physician (Cardiology) Clinton Gallant, RN as [facility] Care Management Gwynneth Munson, Corrie Dandy (Neurology)  Indicate any recent Medical Services you may have received from other than [facility] providers in the past year (date may be approximate).     Assessment:   This is a routine wellness examination for Jolyne.  Hearing/Vision screen Vision Screening - Comments:: Wears rx glasses - up to date with routine eye exams with  Dr.Lyles   Dietary issues and exercise activities discussed:     Goals Addressed             This Visit's Progress    Exercise 3x per week (30 min per time)         Depression Screen    01/23/2023    1:06 PM 01/07/2023    9:15 AM 12/22/2022    1:51 PM 11/26/2022    3:09 PM 11/14/2022    2:22 PM 10/22/2022    8:20 AM 10/08/2022    1:19 PM  PHQ 2/9 Scores  PHQ - 2 Score 0 0 0 0 0 0 0  PHQ- 9 Score     0 0     Fall Risk    01/23/2023    1:04 PM 01/07/2023    9:15 AM 12/22/2022    1:51 PM 11/26/2022    3:13 PM 11/26/2022    3:08 PM  Fall Risk   Falls in the past year? 0 1 1  1   Number falls in past yr: 0 1 1 1  0  Comment   had a fall in June and was in the hospital for 3 days after    Injury with Fall? 0 1 1  1   Risk for fall due to : No Fall Risks History of fall(s);Impaired balance/gait Impaired balance/gait;History of fall(s)  History of fall(s)  Follow up Falls prevention discussed    Falls evaluation completed    MEDICARE RISK AT HOME: Medicare Risk at Home Any stairs in or around the home?: Yes If so, are there any without handrails?: No Home free of loose throw rugs in walkways, pet beds, electrical cords, etc?: Yes Adequate lighting in your home to reduce risk of falls?: Yes Life alert?: No Use of a cane, walker or w/c?: No Grab bars in the bathroom?: Yes Shower chair or bench in shower?: Yes Elevated toilet seat or a handicapped toilet?: Yes  TIMED UP AND GO:  Was the test  performed?  No    Cognitive Function:    11/08/2021    7:00 AM 08/28/2020   11:00 AM 01/18/2018    4:00 PM 09/22/2017    2:28 PM 04/29/2017    3:06 PM  MMSE - Mini Mental State Exam  Orientation to time 5 4 5 5 5   Orientation to Place 5 5 5 5 4   Registration 3 3 3 3 3   Attention/ Calculation 3 1 3 5 5   Recall 3 3 3 3 3   Language- name 2 objects 2 2 2 2 2   Language- repeat 1 1 1 1 1   Language- follow 3 step command 3 3 3 3 3   Language- read & follow direction 1 1  1 1 1   Write a sentence 1 1 1 1 1   Copy design 1 1 1 1 1   Total score 28 25 28 30 29       01/26/2019   11:00 AM 08/06/2018   11:00 AM  Montreal Cognitive Assessment   Visuospatial/ Executive (0/5) 2 4  Naming (0/3) 3 3  Attention: Read list of digits (0/2) 1 2  Attention: Read list of letters (0/1) 0 1  Attention: Serial 7 subtraction starting at 100 (0/3) 0 1  Language: Repeat phrase (0/2) 1 1  Language : Fluency (0/1) 0 0  Abstraction (0/2) 1 1  Delayed Recall (0/5) 0 4  Orientation (0/6) 3 6  Total 11 23      01/23/2023    1:08 PM 12/13/2021    1:26 PM 12/12/2020    1:35 PM 11/30/2019   11:41 AM 11/29/2018    2:46 PM  6CIT Screen  What Year? 0 points 0 points 0 points 0 points 0 points  What month? 0 points 0 points 0 points 0 points 0 points  What time? 0 points 0 points 0 points 0 points 0 points  Count back from 20 0 points 0 points 0 points 4 points 4 points  Months in reverse 0 points 0 points 2 points 4 points 4 points  Repeat phrase 0 points 2 points 0 points 4 points 2 points  Total Score 0 points 2 points 2 points 12 points 10 points    Immunizations Immunization History  Administered Date(s) Administered   Fluad Quad(high Dose 65+) 03/04/2019, 04/03/2021, 03/26/2022   Influenza, High Dose Seasonal PF 03/30/2015, 03/17/2016, 03/19/2017, 02/17/2018   Influenza,inj,Quad PF,6+ Mos 03/30/2015, 03/17/2016, 02/29/2020   Moderna Sars-Covid-2 Vaccination 07/18/2019, 08/15/2019, 05/02/2020   Pneumococcal  Conjugate-13 11/12/2017   Pneumococcal Polysaccharide-23 05/30/2015   Tdap 07/17/2011   Zoster Recombinant(Shingrix) 01/30/2021, 04/09/2021   Zoster, Live 07/17/2011, 06/02/2012    TDAP status: Due, Education has been provided regarding the importance of this vaccine. Advised may receive this vaccine at local pharmacy or Health Dept. Aware to provide a copy of the vaccination record if obtained from local pharmacy or Health Dept. Verbalized acceptance and understanding.  Flu Vaccine status: Up to date  Pneumococcal vaccine status: Up to date  Covid-19 vaccine status: Completed vaccines  Qualifies for Shingles Vaccine? Yes   Zostavax completed Yes   Shingrix Completed?: Yes  Screening Tests Health Maintenance  Topic Date Due   DTaP/Tdap/Td (2 - Td or Tdap) 07/16/2021   COVID-19 Vaccine (4 - 2023-24 season) 01/31/2022   Colonoscopy  03/31/2022   OPHTHALMOLOGY EXAM  11/28/2022   Diabetic kidney evaluation - Urine ACR  01/14/2023   INFLUENZA VACCINE  01/01/2023   MAMMOGRAM  02/22/2023   DEXA SCAN  04/06/2023   HEMOGLOBIN A1C  07/16/2023   FOOT EXAM  10/02/2023   Diabetic kidney evaluation - eGFR measurement  01/18/2024   Medicare Annual Wellness (AWV)  01/23/2024   Pneumonia Vaccine 43+ Years old  Completed   Hepatitis C Screening  Completed   Zoster Vaccines- Shingrix  Completed   HPV VACCINES  Aged Out    Health Maintenance  Health Maintenance Due  Topic Date Due   DTaP/Tdap/Td (2 - Td or Tdap) 07/16/2021   COVID-19 Vaccine (4 - 2023-24 season) 01/31/2022   Colonoscopy  03/31/2022   OPHTHALMOLOGY EXAM  11/28/2022   Diabetic kidney evaluation - Urine ACR  01/14/2023   INFLUENZA VACCINE  01/01/2023    Colorectal cancer screening: No  longer required.   Mammogram status: No longer required due to age .  Bone Density status: Completed 04/05/2021. Results reflect: Bone density results: OSTEOPOROSIS. Repeat every 2 years.  Lung Cancer Screening: (Low Dose CT Chest  recommended if Age 20-80 years, 20 pack-year currently smoking OR have quit w/in 15years.) does not qualify.   Lung Cancer Screening Referral: n/a  Additional Screening:  Hepatitis C Screening: does not qualify; Completed 03/30/2015  Vision Screening: Recommended annual ophthalmology exams for early detection of glaucoma and other disorders of the eye. Is the patient up to date with their annual eye exam?  Yes  Who is the provider or what is the name of the office in which the patient attends annual eye exams? Dr.Lyles  If pt is not established with a provider, would they like to be referred to a provider to establish care? No .   Dental Screening: Recommended annual dental exams for proper oral hygiene   Community Resource Referral / Chronic Care Management: CRR required this visit?  No   CCM required this visit?  No     Plan:     I have personally reviewed and noted the following in the patient's chart:   Medical and social history Use of alcohol, tobacco or illicit drugs  Current medications and supplements including opioid prescriptions. Patient is not currently taking opioid prescriptions. Functional ability and status Nutritional status Physical activity Advanced directives List of other physicians Hospitalizations, surgeries, and ER visits in previous 12 months Vitals Screenings to include cognitive, depression, and falls Referrals and appointments  In addition, I have reviewed and discussed with patient certain preventive protocols, quality metrics, and best practice recommendations. A written personalized care plan for preventive services as well as general preventive health recommendations were provided to patient.     Lorrene Reid, LPN   11/04/5407   After Visit Summary: (MyChart) Due to this being a telephonic visit, the after visit summary with patients personalized plan was offered to patient via MyChart   Nurse Notes: none

## 2023-01-26 ENCOUNTER — Ambulatory Visit (HOSPITAL_COMMUNITY)
Admission: RE | Admit: 2023-01-26 | Discharge: 2023-01-26 | Disposition: A | Discharge status: Home / Self Care | Payer: Medicare Other | Source: Ambulatory Visit | Attending: Psychiatry | Admitting: Psychiatry

## 2023-01-26 ENCOUNTER — Encounter: Payer: Self-pay | Admitting: Oncology

## 2023-01-26 ENCOUNTER — Inpatient Hospital Stay: Payer: Medicare Other | Admitting: Psychiatry

## 2023-01-26 VITALS — BP 123/69 | HR 98 | Resp 18 | Wt 196.8 lb

## 2023-01-26 DIAGNOSIS — C541 Malignant neoplasm of endometrium: Secondary | ICD-10-CM

## 2023-01-26 DIAGNOSIS — I5032 Chronic diastolic (congestive) heart failure: Secondary | ICD-10-CM

## 2023-01-26 DIAGNOSIS — Z7189 Other specified counseling: Secondary | ICD-10-CM | POA: Diagnosis not present

## 2023-01-26 DIAGNOSIS — J9611 Chronic respiratory failure with hypoxia: Secondary | ICD-10-CM | POA: Diagnosis not present

## 2023-01-26 DIAGNOSIS — N858 Other specified noninflammatory disorders of uterus: Secondary | ICD-10-CM | POA: Diagnosis not present

## 2023-01-26 DIAGNOSIS — K5730 Diverticulosis of large intestine without perforation or abscess without bleeding: Secondary | ICD-10-CM | POA: Diagnosis not present

## 2023-01-26 MED ORDER — GADOBUTROL 1 MMOL/ML IV SOLN
10.0000 mL | Freq: Once | INTRAVENOUS | Status: AC | PRN
  Administered 2023-01-26: 10 mL via INTRAVENOUS

## 2023-01-26 MED ORDER — GADOBUTROL 1 MMOL/ML IV SOLN: 9.0000 mL | Freq: Once | INTRAVENOUS | Status: DC | PRN

## 2023-01-26 NOTE — Patient Instructions (Signed)
It was a pleasure to see you in clinic today. - Based on your MRI, I think we need to consider radiation treatment given that surgery is not an option currently due to your heart and lungs.  - I will reach out to your cardiologist to see if they think the hormone medication is contributing at all to your symptoms. - We will get you set up to see the Radiation Oncologist, Dr. Roselind Messier.  Thank you very much for allowing me to provide care for you today.  I appreciate your confidence in choosing our Gynecologic Oncology team at [facility].  If you have any questions about your visit today please call our office or send [place] a MyChart message and we will get back to you as soon as possible.

## 2023-01-26 NOTE — Progress Notes (Signed)
Referral placed for radiation oncology per Dr. Ernestina Patches.

## 2023-01-26 NOTE — Progress Notes (Signed)
Gynecologic Oncology Return Clinic Visit  Date of Service: 01/26/2023 Referring Provider: [name], DO   Assessment & Plan: Amber Stephenson is a 77 y.o. 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  Reviewed imaging findings.  Based on imaging, at least 50% myometrial invasion based on MRI pelvis.  Otherwise, no evidence of metastatic disease on CT scans.  Given the myometrial involvement, she is not an ideal candidate for hormone only primary treatment of her endometrial cancer.  However, do not feel that patient is an appropriate surgical candidate at this time, particularly with recent hospitalization for CHF exacerbation and respiratory failure exacerbation.  Given this, I feel that she may be a more appropriate candidate for definitive radiation treatment.  No apparent lymph node involvement on imaging.  Patient does report that she has resumed her Aygestin.  Uncertain if this has contributed to her heart failure exacerbation.  Will reach out to her cardiology team to see if they have concerns regarding this medication.  If so, we will see if patient stable from a bleeding perspective until radiation treatment.  If continued bleeding but holding Aygestin, could consider placement of IUD instead.  Reviewed this with patient.  Referral to radiation collagen placed.  RTC pending radiation oncology consultation.  [name], MD Gynecologic Oncology   Medical Decision Making I personally spent  TOTAL 45 minutes face-to-face and non-face-to-face in the care of this patient, which includes all pre, intra, and post visit time on the date of service.   ----------------------- Reason for Visit:  Follow-up, treatment discussion  Treatment History: Oncology History  Endometrial cancer (HCC)  11/25/2022 Imaging   Pelvic ultrasound: EMS 8.45mm   12/29/2022 Initial Biopsy   A. ENODMETRIUM, BIOPSY:  - Endometrioid carcinoma, FIGO grade 1 (see comment)   COMMENT: This case was reviewed with Dr. Wenda Low who agrees with the above  diagnosis.    12/29/2022 Initial Diagnosis   Endometrial cancer (HCC)   01/13/2023 Imaging   CT chest/abdomen/pelvis: IMPRESSION: 1. Perihilar ground-glass infiltrates in the lungs likely representing edema or multifocal pneumonia. 2. No evidence of metastatic disease seen in the chest, abdomen, or pelvis on noncontrast imaging. 3. Aortic atherosclerosis 4. Multiple chronic appearing endplate compression deformities in the spine likely indicate osteoporosis.   01/26/2023 Cancer Staging   Staging form: Corpus Uteri - Carcinoma and Carcinosarcoma, AJCC 8th Edition - Clinical stage from 01/26/2023: FIGO Stage IB (cT1b, cN0, cM0) - Signed by [name], MD on 02/03/2023 Histopathologic type: Endometrioid adenocarcinoma, NOS Stage prefix: Initial diagnosis Histologic grade (G): G1 Histologic grading system: 3 grade system   01/26/2023 Imaging   MRI Pelvis: IMPRESSION: 3.9 cm endometrial mass, with suspected 50% myometrial invasion, as above. This is considered borderline for FIGO stage IA/1B by MRI, pending histology.   No evidence of metastatic disease in the pelvis.     Interval History: Since her last visit, patient was admitted to the hospital for acute on chronic respiratory failure and acute on chronic diastolic heart failure with demand ischemia and atrial fibrillation with RVR.  She was discharged to home with PT/OT.  She reports that PT started last week and OT will start later this week.  At home, and she is using oxygen during the day when she is short of breath and  always at night.  When she is short of breath during the day she will use 2  L of oxygen.  Patient reports she self discontinued Aygestin due to concern of side effects (acne, oily skin).  But the bleeding picked back up so she resumed the Aygestin yesterday.  During her hospitalization she had a CT chest, abdomen, and pelvis without contrast completed which noted no evidence of metastatic disease in the chest, abdomen, or pelvis.  She then underwent an MRI pelvis today which showed a 3.9 cm endometrial mass and suspected 50% myometrial invasion.  No evidence of metastatic disease in the pelvis.   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   Benign paroxysmal positional vertigo due to bilateral vestibular disorder 05/20/2019   Bipolar 1 disorder (HCC) 01/23/2015   with GAD, benzo dependence   BPPV (benign paroxysmal positional vertigo) 05/20/2019   CAD (coronary artery disease)    Nonobstructive; Managed by Dr. Purvis Sheffield   Cardiomegaly 01/12/2018   CHF (congestive heart failure) 06/08/2022   Chronic back pain 01/04/2015   Chronic constipation 04/25/2020   Chronic diastolic heart failure (HCC) 05/30/2015   Chronic pain syndrome 08/22/2019   back pain, sacroiliitis   Chronic pain syndrome 08/22/2019   Chronic post-traumatic stress disorder (PTSD) 12/06/2020   Diabetic neuropathy (HCC) 02/06/2016   Dyslipidemia 09/24/2020   Dyspnea on exertion    Essential hypertension    Family history of coronary arteriosclerosis 05/30/2015   Functional diarrhea 10/26/2020   Head trauma 09/17/2020   Hemorrhoids 03/11/2022   Herpes genitalis in women 07/16/2015   History of adenomatous polyp of  colon 05/21/2019   Overview:   03/31/17: Colonoscopy: nonadvanced adenoma, microscopic colitis, f/u 5 yrs, [facility]   Insomnia 01/23/2015   Lipoma 02/08/2015   Migraine headache with aura 02/12/2016   Myofascial pain dysfunction syndrome 08/22/2019   Myofascial pain dysfunction syndrome 08/22/2019   Non-alcoholic fatty liver disease 01/12/2018   Opioid dependence (HCC) 12/06/2020   OSA (obstructive sleep apnea) 02/24/2019   10/09/2018 - HST  - AHI 40.6    Osteopenia 12/06/2020   Rx alendronate 35 mg.   Pinguecula 12/06/2020   Postcoital bleeding 12/06/2020   Presbyopia 12/06/2020   Pulmonary hypertension    RLS (restless legs syndrome) 04/27/2015   Superficial fungus infection of skin 09/03/2021   Tremor, essential 12/11/2021   Type II diabetes mellitus (HCC) 09/24/2020    Past Surgical History:  Procedure Laterality Date   BREAST REDUCTION SURGERY     EYE SURGERY Right    cateracts   HAMMER TOE SURGERY     LEFT HEART CATH AND CORONARY ANGIOGRAPHY N/A 02/03/2018   Procedure: LEFT HEART CATH AND CORONARY ANGIOGRAPHY;  Surgeon: [name], Peter M, MD;  Location: [facility] INVASIVE CV LAB;  Service: Cardiovascular;  Laterality: N/A;   REVERSE SHOULDER ARTHROPLASTY Right 08/19/2019   Procedure: REVERSE SHOULDER ARTHROPLASTY;  Surgeon: Beverely Low, MD;  Location: [facility] ORS;  Service: Orthopedics;  Laterality: Right;  interscalene block   SHOULDER SURGERY Right    "I BROKE MY SHOUDLER   THIGH SURGERY     "TO REMOVE A TUMOR "    Family History  Problem Relation  Age of Onset   Diabetes Mother    Heart disease Mother    Alzheimer's disease Father    Heart disease Sister        CABG   Diabetes Sister    Alcohol abuse Sister    Stroke Brother    Heart disease Brother    Mental illness Brother    Diabetes Brother    Breast cancer Neg Hx    Ovarian cancer Neg Hx    Colon cancer Neg Hx    Endometrial cancer Neg Hx     Social History   Socioeconomic History   Marital status: Married     Spouse name: Dwight   Number of children: 3   Years of education: 15   Highest education level: Associate degree: academic program  Occupational History   Occupation: Retired    Comment: Chief Financial Officer  Tobacco Use   Smoking status: Never   Smokeless tobacco: Never  Vaping Use   Vaping status: Never Used  Substance and Sexual Activity   Alcohol use: Not Currently    Alcohol/week: 0.0 standard drinks of alcohol   Drug use: No   Sexual activity: Yes    Partners: Male    Birth control/protection: Post-menopausal  Other Topics Concern   Not on file  Social History Narrative   Lives at home with husband.    They have 3 children who live away - [place], [place], and [place].   She is from [place] and most of her family lives there.   Her husbands's family live nearby   Right handed.   Social Determinants of Health   Financial Resource Strain: Low Risk  (01/23/2023)   Overall Financial Resource Strain (CARDIA)    Difficulty of Paying Living Expenses: Not hard at all  Food Insecurity: No Food Insecurity (02/03/2023)   Hunger Vital Sign    Worried About Running Out of Food in the Last Year: Never true    Ran Out of Food in the Last Year: Never true  Transportation Needs: No Transportation Needs (02/03/2023)   PRAPARE - Administrator, Civil Service (Medical): No    Lack of Transportation (Non-Medical): No  Physical Activity: Inactive (01/23/2023)   Exercise Vital Sign    Days of Exercise per Week: 0 days    Minutes of Exercise per Session: 0 min  Stress: No Stress Concern Present (01/23/2023)   [employer] of Occupational Health - Occupational Stress Questionnaire    Feeling of Stress : Not at all  Social Connections: Moderately Integrated (01/23/2023)   Social Connection and Isolation Panel [NHANES]    Frequency of Communication with Friends and Family: More than three times a week    Frequency of Social Gatherings with Friends and Family: More than three times  a week    Attends Religious Services: More than 4 times per year    Active Member of [employer] or Organizations: No    Attends Engineer, structural: Never    Marital Status: Married    Current Medications:  Current Outpatient Medications:    albuterol (VENTOLIN HFA) 108 (90 Base) MCG/ACT inhaler, Inhale 2 puffs into the lungs every 6 (six) hours as needed for wheezing or shortness of breath., Disp: 1 each, Rfl: 0   allopurinol (ZYLOPRIM) 100 MG tablet, Take 100 mg by mouth daily., Disp: , Rfl:    apixaban (ELIQUIS) 5 MG TABS tablet, Take 1 tablet (5 mg total) by mouth 2 (two) times daily., Disp: 180 tablet, Rfl: 3  ARIPiprazole (ABILIFY) 2 MG tablet, Take 2 mg by mouth at bedtime., Disp: , Rfl:    atorvastatin (LIPITOR) 40 MG tablet, Take 1 tablet (40 mg total) by mouth daily., Disp: 90 tablet, Rfl: 0   BELSOMRA 20 MG TABS, Take 20 mg by mouth at bedtime as needed (for sleep)., Disp: , Rfl:    Continuous Blood Gluc Sensor (DEXCOM G7 SENSOR) MISC, Change sensor every 10 days (Patient taking differently: Inject 1 Device into the skin See admin instructions. Place 1 new sensor into the skin every 10 days), Disp: 9 each, Rfl: 3   cyanocobalamin (VITAMIN B12) 1000 MCG tablet, Take 1 tablet (1,000 mcg total) by mouth daily., Disp: 30 tablet, Rfl: 1   denosumab (PROLIA) 60 MG/ML SOSY injection, Inject 60 mg into the skin every 6 (six) months., Disp: 1 mL, Rfl: 1   doxepin (SINEQUAN) 10 MG capsule, Take 10 mg by mouth at bedtime., Disp: , Rfl:    escitalopram (LEXAPRO) 10 MG tablet, Take 10 mg by mouth daily., Disp: , Rfl:    fluticasone-salmeterol (ADVAIR DISKUS) 100-50 MCG/ACT AEPB, Inhale 1 puff into the lungs 2 (two) times daily. (Patient taking differently: Inhale 1 puff into the lungs 2 (two) times daily as needed (for flares).), Disp: 1 each, Rfl: 5   furosemide (LASIX) 20 MG tablet, Take 20 mg by mouth See admin instructions. Take 20 mg by mouth at 8 AM and 6 PM, Disp: , Rfl:     Galcanezumab-gnlm (EMGALITY) 120 MG/ML SOAJ, INJECT CONTENTS ON 1 PEN(120MG ) INTO THE SKIN ONCE MONTHLY (Patient taking differently: Inject 120 mg into the skin every 30 (thirty) days.), Disp: 1 mL, Rfl: 3   insulin glargine (LANTUS SOLOSTAR) 100 UNIT/ML Solostar Pen, Inject 35 Units into the skin 2 (two) times daily., Disp: 60 mL, Rfl: 3   insulin lispro (HUMALOG KWIKPEN) 100 UNIT/ML KwikPen, Max daily 50 units (Patient taking differently: Inject 4 Units into the skin See admin instructions. Inject 4 units into the skin 2 times a day with meals, per sliding scale. Add 1 additional unit for every 30 points above a BGL reading of 160.), Disp: 45 mL, Rfl: 6   Insulin Pen Needle 29G X MISC, 1 Device by Does not apply route daily in the afternoon., Disp: 400 each, Rfl: 3   LORazepam (ATIVAN) 0.5 MG tablet, Take 0.5-1 mg by mouth See admin instructions. Take 0.5 mg by mouth in the morning and 1 mg at bedtime, Disp: , Rfl:    metFORMIN (GLUCOPHAGE-XR) 500 MG 24 hr tablet, Take 2 tablets (1,000 mg total) by mouth daily with breakfast., Disp: 180 tablet, Rfl: 3   metoprolol tartrate (LOPRESSOR) 50 MG tablet, Take 1 tablet (50 mg total) by mouth 2 (two) times daily., Disp: 180 tablet, Rfl: 2   morphine (MS CONTIN) 15 MG 12 hr tablet, Take 1 tablet (15 mg total) by mouth every 12 (twelve) hours. For chronic pain- can refill by 01/13/23, Disp: 60 tablet, Rfl: 0   naloxone (NARCAN) nasal spray 4 mg/0.1 mL, Place 1 spray into the nose once as needed (AS DIRECTED)., Disp: , Rfl:    norethindrone (AYGESTIN) 5 MG tablet, Take 2 tablets (10 mg total) by mouth daily., Disp: 60 tablet, Rfl: 0   nystatin (MYCOSTATIN/NYSTOP) powder, Apply 1 Application topically as needed (rash)., Disp: 15 g, Rfl: 2   nystatin ointment (MYCOSTATIN), Apply 1 Application topically 2 (two) times daily. (Patient taking differently: Apply 1 Application topically 2 (two) times daily as needed (for  rashes).), Disp: 30 g, Rfl: 3   ondansetron  (ZOFRAN-ODT) 8 MG disintegrating tablet, Take 1 tablet (8 mg total) by mouth every 6 (six) hours as needed for nausea or vomiting., Disp: 20 tablet, Rfl: 1   OXcarbazepine (TRILEPTAL) 150 MG tablet, Take 1 tablet (150 mg total) by mouth 2 (two) times daily., Disp: 60 tablet, Rfl: 0   pantoprazole (PROTONIX) 40 MG tablet, Take 40 mg by mouth daily as needed (for reflux)., Disp: , Rfl:    pregabalin (LYRICA) 200 MG capsule, Take 200 mg by mouth 2 (two) times daily., Disp: , Rfl:    senna-docusate (SENOKOT-S) 8.6-50 MG tablet, Take 1 tablet by mouth at bedtime. (Patient taking differently: Take 1 tablet by mouth at bedtime as needed (for constipation).), Disp: 30 tablet, Rfl: 0   spironolactone (ALDACTONE) 25 MG tablet, Take 0.5 tablets (12.5 mg total) by mouth daily., Disp: 15 tablet, Rfl: 2   tirzepatide (MOUNJARO) 5 MG/0.5ML Pen, Inject 5 mg into the skin once a week. (Patient taking differently: Inject 5 mg into the skin every Sunday.), Disp: 2 mL, Rfl: 2   TYLENOL 500 MG tablet, Take 500-1,000 mg by mouth every 6 (six) hours as needed for mild pain or headache., Disp: , Rfl:    Vitamin D, Cholecalciferol, 25 MCG (1000 UT) TABS, Take 1,000 Units by mouth daily in the afternoon., Disp: , Rfl:  No current facility-administered medications for this visit.  Facility-Administered Medications Ordered in Other Visits:    bupivacaine-epinephrine (MARCAINE W/ EPI) 0.5% -1:200000 injection, , , Anesthesia Intra-op, Shelton Silvas, MD, 12 mL at 01/21/19 0935  Review of Symptoms: Complete 10-system review is positive for: Appetite changes, shortness of breath, constipation, urinary frequency, wheezing, abdominal distention, pelvic pain, back pain, anxiety, vaginal bleeding, muscle cramps, dizziness, weight gain, itching, problem walking, confusion  Physical Exam: BP 123/69   Pulse 98 Comment: CMA notified  Resp 18   Wt 196 lb 12.8 oz (89.3 kg)   SpO2 (!) 89% Comment: O2 has been 88 since this morning, pt  claims she has trouble breathing  BMI 36.00 kg/m  General: Alert, oriented, no acute distress. HEENT: Normocephalic, atraumatic. Neck symmetric without masses. Sclera anicteric. Chest: Slight increased work of breathing with long sentences.  Otherwise normal work of breathing at rest.  Extremities: Sitting comfortably in wheelchair   Laboratory & Radiologic Studies: MR Pelvis W Wo Contrast 2023/02/05  Narrative CLINICAL DATA:  Endometrial cancer, for staging  EXAM: MRI PELVIS WITHOUT AND WITH CONTRAST  TECHNIQUE: Multiplanar multisequence MR imaging of the pelvis was performed both before and after administration of intravenous contrast.  CONTRAST:  10mL GADAVIST GADOBUTROL 1 MMOL/ML IV SOLN  COMPARISON:  CT abdomen/pelvis dated 01/13/2023.  FINDINGS: Urinary Tract: Bladder is mildly thick-walled although underdistended.  Bowel: Mild sigmoid diverticulosis, without associated inflammatory changes.  Vascular/Lymphatic: No evidence of aneurysm.  No suspicious pelvic lymphadenopathy.  Reproductive: 3.9 x 3.2 x 2.5 cm endometrial mass (series 4/image 20), with suspected myometrial invasion along the posterior fundus (series 11/image 42), with suspected ~50% myometrial invasion.  No involvement of the cervix.  Bilateral ovaries are within normal limits.  Other:  No pelvic ascites.  Musculoskeletal: No focal osseous lesions. Subcutaneous edema/postprocedural changes in the bilateral gluteal region.  IMPRESSION: 3.9 cm endometrial mass, with suspected 50% myometrial invasion, as above. This is considered borderline for FIGO stage IA/1B by MRI, pending histology.  No evidence of metastatic disease in the pelvis.   Electronically Signed By: Charline Bills M.D. On:  01/26/2023 13:33   CT CHEST ABDOMEN PELVIS WO CONTRAST 01/13/2023  Narrative CLINICAL DATA:  Respiratory illness with nondiagnostic x-ray. History of endometrial cancer.  EXAM: CT CHEST,  ABDOMEN AND PELVIS WITHOUT CONTRAST  TECHNIQUE: Multidetector CT imaging of the chest, abdomen and pelvis was performed following the standard protocol without IV contrast.  RADIATION DOSE REDUCTION: This exam was performed according to the departmental dose-optimization program which includes automated exposure control, adjustment of the mA and/or kV according to patient size and/or use of iterative reconstruction technique.  COMPARISON:  Chest radiograph 01/12/2023 and 11/26/2022. CT chest 09/01/2022  FINDINGS: CT CHEST FINDINGS  Cardiovascular: Mild cardiac enlargement. Calcification in the aortic and mitral valves. No pericardial effusions. Normal caliber thoracic aorta. Calcification of the aorta and coronary arteries.  Mediastinum/Nodes: Esophagus is decompressed. Residual contrast material is demonstrated in the esophagus, possibly representing reflux or dysmotility. No significant lymphadenopathy. Thyroid gland is unremarkable.  Lungs/Pleura: Patchy ground-glass infiltrates throughout both lungs in a mostly perihilar distribution. This could represent edema, multifocal pneumonia, or air trapping. No pleural effusions. No pneumothorax.  Musculoskeletal: Postoperative right shoulder arthroplasty. Old rib fractures. Degenerative changes in the spine. Old appearing endplate compression deformities at T9, T11, and T12. No destructive bone lesions.  CT ABDOMEN PELVIS FINDINGS  Hepatobiliary: No focal liver abnormality is seen. No gallstones, gallbladder wall thickening, or biliary dilatation.  Pancreas: Unremarkable. No pancreatic ductal dilatation or surrounding inflammatory changes.  Spleen: Normal in size without focal abnormality.  Adrenals/Urinary Tract: No adrenal gland nodules. Mild parenchymal atrophy in the kidneys. No hydronephrosis or hydroureter. No renal, ureteral, or bladder stones. Bladder is normal.  Stomach/Bowel: Stomach, small bowel, and colon  are not abnormally distended. Contrast material flows through to the cecum without evidence of bowel obstruction. No wall thickening or inflammatory changes are appreciated. There is a duodenal diverticulum. The appendix is normal.  Vascular/Lymphatic: Aortic atherosclerosis. No enlarged abdominal or pelvic lymph nodes.  Reproductive: Uterus and bilateral adnexa are unremarkable.  Other: No abdominal wall hernia or abnormality. No abdominopelvic ascites.  Musculoskeletal: Mild endplate compression of L2 appears chronic. Degenerative changes in the spine. No destructive bone lesions.  IMPRESSION: 1. Perihilar ground-glass infiltrates in the lungs likely representing edema or multifocal pneumonia. 2. No evidence of metastatic disease seen in the chest, abdomen, or pelvis on noncontrast imaging. 3. Aortic atherosclerosis 4. Multiple chronic appearing endplate compression deformities in the spine likely indicate osteoporosis.   Electronically Signed By: Burman Nieves M.D. On: 01/13/2023 17:02

## 2023-01-27 ENCOUNTER — Encounter: Payer: Self-pay | Admitting: Radiation Oncology

## 2023-01-27 ENCOUNTER — Telehealth: Payer: Self-pay

## 2023-01-27 NOTE — Progress Notes (Signed)
GYN Location of Tumor / Histology: Endometrial  MR Pelvis W Wo Contrast 01-27-23 IMPRESSION: 3.9 cm endometrial mass, with suspected 50% myometrial invasion, as above. This is considered borderline for FIGO stage IA/1B by MRI, pending histology.  CT ABDOMEN PELVIS WO CONTRAST 01/13/2023  IMPRESSION: 1. Perihilar ground-glass infiltrates in the lungs likely representing edema or multifocal pneumonia. 2. No evidence of metastatic disease seen in the chest, abdomen, or pelvis on noncontrast imaging. 3. Aortic atherosclerosis 4. Multiple chronic appearing endplate compression deformities in the spine likely indicate osteoporosis.   No evidence of metastatic disease in the pelvis.  Amber Stephenson presented with symptoms of: bleeding PMB: This has been an ongoing issue for the past year.  Bleeding seems to be intermittent, last episode about a week ago.  Denies pelvic or abdominal pain.   Recently seen by J.Griffin- treated for vulvar itching with gentian violet.   Biopsies revealed:  12-25-22 FINAL MICROSCOPIC DIAGNOSIS:   A. ENODMETRIUM, BIOPSY:  - Endometrioid carcinoma, FIGO grade 1 (see comment)   COMMENT:   This case was reviewed with Dr. Wenda Low who agrees with the above  diagnosis.   Past/Anticipated interventions by Gyn/Onc surgery, if any:  [name], DO 12/25/2022  Endometrial Biopsy Procedure Note   Pre-operative Diagnosis: postmenopausal bleeding   Post-operative Diagnosis: same  Assessment & Plan:  1) PMB, Thickened endometrium -Reviewed US findings and discussed potential etiologies -recommendation for EMB- procedure as above -Next step pending results of pathology.   The patient was advised to call for any fever or for prolonged or severe pain or bleeding. She was advised to use OTC analgesics as needed for mild to moderate pain. She was advised to avoid vaginal intercourse for 48 hours or until the bleeding has completely stopped.  [name],  MD 01/05/2023  HISTORY OF PRESENT ILLNESS:   Amber Stephenson is a 77 y.o. woman who is seen in consultation at the request of [name], DO for evaluation of FIGO grade 1 endometrial cancer.   Patient presented to her OB/GYN 12/25/2022 for concern of postmenopausal bleeding.  She reported that the bleeding has been going on for about a year and intermittent.  An ultrasound was performed on 11/25/2022 which showed an endometrial stripe of 8.9 mm.  She underwent an endometrial biopsy which returned with endometrioid carcinoma, FIGO grade 1   Today, patient presents with her husband.  She reports on and off spotting for the past 5 years.  It got heavier in the past year.  She denies bleeding currently.  She was started on Aygestin 10 mg daily about a week ago and the bleeding stopped after initiating that medication.  She otherwise reports about a 10 pound weight loss, abdominal bloating, and early satiety in the past month.  She denies change in bowel or bladder habits.   In terms of her cardiac history, patient does not have any coronary artery stents in place.  She follows with Dr. Armanda Magic and was last seen on 09/09/2022.  Her most recent echo was on 11/10/2022 and showed an LVEF of 65 to 70%.  In regards to her pulmonary history, patient reports that she uses oxygen at home as needed.  When she uses it it is on 2 L.  She reports using it about 4 times a week.  She follows with Rubye Oaks, NP with  Pulmonlogy and was last seen on 09/01/2022.  In terms of her chronic pain, she sees Dr. Genice Rouge.  She is on  MS Contin 15 mg twice daily.  ASSESSMENT AND PLAN: Amber Stephenson is a 24 y.o. woman with PMHx notable for afib, CAD, CHF, chronic respiratory failure, chronic pain, OSA, T2DM, now with new diagnosis of FIGO grade 1 endometrioid endometrial cancer.   We reviewed the nature of endometrial cancer and its recommended surgical staging, including total hysterectomy, bilateral  salpingo-oophorectomy, and lymph node assessment.  However, given patient's medical comorbidities, at this time not certain that patient is in optimal candidate for surgical management of endometrial cancer.   Also, given that patient has had on and off bleeding for 5 years and has had some systemic symptoms in the past month, it would be important to rule out metastatic disease at this time.  Recommend patient undergo a CT abdomen/pelvis to evaluate for any evidence of metastatic disease.   In lieu of surgical management, we did review that alternative treatments for endometrial cancer could include hormonal management, radiation treatment, or chemotherapy.  In order to determine if patient would be an appropriate candidate for hormonal therapy, recommend pelvic MRI to evaluate for evidence of myometrial invasion.  If CT scan as above shows evidence of metastatic disease, can defer pelvic MRI.   We reviewed the role of progesterone therapy in the treatment of endometrium confined endometrial cancer. We reviewed the 3 most studied options, to include levonorgesterol IUD (63mcg/d), oral medorxyprogesterone acetate 10mg  daily or cyclically 12-14 days per month, or oral megesterol acetate 40-200mg  per day.     She understands that all options have few side effects, most common being infrequent edema, GI disturbances, and thromboembolic events. Local progesterone through IUD may have a stronger effect on the endometrium with less systemic side effects.  Furthermore, studies demonstrate that 80-90% of women will have regression of their hyperplasia with progesterone use.     For now, patient will continue on the Aygestin.  If based on imaging above patient is an appropriate candidate for hormonal therapy alone, we would like to try to proceed with a dilation and curettage and IUD insertion.   We discussed that monitoring would be with an endometrial biopsy every 3 to 6 months following initiation of treatment  until we have achieved 2-3 negative biopsies, after which time sampling frequency could decrease.   In anticipation of possible D&C procedure, we will go ahead and work towards clearance with her pulmonologist and cardiologist.  Past/Anticipated interventions by medical oncology, if any: none in EMR  Weight changes, if any: yes, has been gaining weight for a about a week, her doctor told her this was most likely fluid build up  Bowel/Bladder complaints, if any: No., pt denies bowel and bladder issues  Nausea/Vomiting, if any: no  Pain issues, if any:  no, pt denies pain at this time  SAFETY ISSUES: Prior radiation? no Pacemaker/ICD? no Possible current pregnancy? no Is the patient on methotrexate? no  Current Complaints / other details:  Pt overall would like to know if treatment will be painful  and how long treatments will be.

## 2023-01-27 NOTE — Telephone Encounter (Signed)
 RN called pt for meaningful use and nurse evaluation information. Consult note completed and routed to Dr. Roselind Messier for review.

## 2023-01-28 ENCOUNTER — Other Ambulatory Visit: Payer: Self-pay

## 2023-01-28 ENCOUNTER — Emergency Department (HOSPITAL_COMMUNITY): Payer: Medicare Other

## 2023-01-28 ENCOUNTER — Inpatient Hospital Stay
Admit: 2023-01-28 | Discharge: 2023-01-28 | Disposition: A | Discharge status: Home / Self Care | Payer: Medicare Other | Attending: Radiation Oncology | Admitting: Radiation Oncology

## 2023-01-28 ENCOUNTER — Encounter (HOSPITAL_COMMUNITY): Payer: Self-pay

## 2023-01-28 ENCOUNTER — Inpatient Hospital Stay (HOSPITAL_COMMUNITY)
Admission: EM | Admit: 2023-01-28 | Discharge: 2023-01-30 | DRG: 291 | Disposition: A | Discharge status: Home / Self Care | Payer: Medicare Other | Source: Ambulatory Visit | Attending: Family Medicine | Admitting: Family Medicine

## 2023-01-28 ENCOUNTER — Inpatient Hospital Stay: Payer: Medicare Other | Admitting: Family Medicine

## 2023-01-28 ENCOUNTER — Ambulatory Visit
Admission: RE | Admit: 2023-01-28 | Discharge: 2023-01-28 | Disposition: A | Discharge status: Home / Self Care | Payer: Medicare Other | Source: Ambulatory Visit | Attending: Radiation Oncology | Admitting: Radiation Oncology

## 2023-01-28 ENCOUNTER — Ambulatory Visit: Payer: Medicare Other

## 2023-01-28 ENCOUNTER — Telehealth: Payer: Self-pay

## 2023-01-28 DIAGNOSIS — I4821 Permanent atrial fibrillation: Secondary | ICD-10-CM | POA: Diagnosis not present

## 2023-01-28 DIAGNOSIS — Z7985 Long-term (current) use of injectable non-insulin antidiabetic drugs: Secondary | ICD-10-CM

## 2023-01-28 DIAGNOSIS — S8002XA Contusion of left knee, initial encounter: Secondary | ICD-10-CM | POA: Diagnosis present

## 2023-01-28 DIAGNOSIS — I5033 Acute on chronic diastolic (congestive) heart failure: Secondary | ICD-10-CM | POA: Diagnosis not present

## 2023-01-28 DIAGNOSIS — G4733 Obstructive sleep apnea (adult) (pediatric): Secondary | ICD-10-CM | POA: Diagnosis present

## 2023-01-28 DIAGNOSIS — R296 Repeated falls: Secondary | ICD-10-CM | POA: Diagnosis present

## 2023-01-28 DIAGNOSIS — N179 Acute kidney failure, unspecified: Secondary | ICD-10-CM | POA: Diagnosis not present

## 2023-01-28 DIAGNOSIS — Z79891 Long term (current) use of opiate analgesic: Secondary | ICD-10-CM

## 2023-01-28 DIAGNOSIS — Z91041 Radiographic dye allergy status: Secondary | ICD-10-CM

## 2023-01-28 DIAGNOSIS — D72829 Elevated white blood cell count, unspecified: Secondary | ICD-10-CM | POA: Diagnosis present

## 2023-01-28 DIAGNOSIS — F411 Generalized anxiety disorder: Secondary | ICD-10-CM | POA: Diagnosis present

## 2023-01-28 DIAGNOSIS — M19072 Primary osteoarthritis, left ankle and foot: Secondary | ICD-10-CM | POA: Diagnosis not present

## 2023-01-28 DIAGNOSIS — G2581 Restless legs syndrome: Secondary | ICD-10-CM | POA: Diagnosis present

## 2023-01-28 DIAGNOSIS — Z66 Do not resuscitate: Secondary | ICD-10-CM | POA: Diagnosis present

## 2023-01-28 DIAGNOSIS — S90122A Contusion of left lesser toe(s) without damage to nail, initial encounter: Secondary | ICD-10-CM | POA: Diagnosis present

## 2023-01-28 DIAGNOSIS — F319 Bipolar disorder, unspecified: Secondary | ICD-10-CM | POA: Diagnosis present

## 2023-01-28 DIAGNOSIS — R7989 Other specified abnormal findings of blood chemistry: Secondary | ICD-10-CM | POA: Diagnosis present

## 2023-01-28 DIAGNOSIS — I5031 Acute diastolic (congestive) heart failure: Secondary | ICD-10-CM | POA: Diagnosis not present

## 2023-01-28 DIAGNOSIS — S92505A Nondisplaced unspecified fracture of left lesser toe(s), initial encounter for closed fracture: Secondary | ICD-10-CM | POA: Diagnosis not present

## 2023-01-28 DIAGNOSIS — I130 Hypertensive heart and chronic kidney disease with heart failure and stage 1 through stage 4 chronic kidney disease, or unspecified chronic kidney disease: Principal | ICD-10-CM | POA: Diagnosis present

## 2023-01-28 DIAGNOSIS — Z888 Allergy status to other drugs, medicaments and biological substances status: Secondary | ICD-10-CM

## 2023-01-28 DIAGNOSIS — C541 Malignant neoplasm of endometrium: Secondary | ICD-10-CM

## 2023-01-28 DIAGNOSIS — J309 Allergic rhinitis, unspecified: Secondary | ICD-10-CM | POA: Diagnosis present

## 2023-01-28 DIAGNOSIS — Z7984 Long term (current) use of oral hypoglycemic drugs: Secondary | ICD-10-CM

## 2023-01-28 DIAGNOSIS — X501XXA Overexertion from prolonged static or awkward postures, initial encounter: Secondary | ICD-10-CM

## 2023-01-28 DIAGNOSIS — Z9181 History of falling: Secondary | ICD-10-CM

## 2023-01-28 DIAGNOSIS — Z8249 Family history of ischemic heart disease and other diseases of the circulatory system: Secondary | ICD-10-CM

## 2023-01-28 DIAGNOSIS — F4312 Post-traumatic stress disorder, chronic: Secondary | ICD-10-CM | POA: Diagnosis present

## 2023-01-28 DIAGNOSIS — I509 Heart failure, unspecified: Secondary | ICD-10-CM

## 2023-01-28 DIAGNOSIS — Z1152 Encounter for screening for COVID-19: Secondary | ICD-10-CM

## 2023-01-28 DIAGNOSIS — Z91040 Latex allergy status: Secondary | ICD-10-CM

## 2023-01-28 DIAGNOSIS — R0602 Shortness of breath: Secondary | ICD-10-CM | POA: Diagnosis not present

## 2023-01-28 DIAGNOSIS — Z96611 Presence of right artificial shoulder joint: Secondary | ICD-10-CM | POA: Diagnosis not present

## 2023-01-28 DIAGNOSIS — E1165 Type 2 diabetes mellitus with hyperglycemia: Secondary | ICD-10-CM | POA: Diagnosis present

## 2023-01-28 DIAGNOSIS — Z7983 Long term (current) use of bisphosphonates: Secondary | ICD-10-CM

## 2023-01-28 DIAGNOSIS — I2510 Atherosclerotic heart disease of native coronary artery without angina pectoris: Secondary | ICD-10-CM | POA: Diagnosis not present

## 2023-01-28 DIAGNOSIS — Z7901 Long term (current) use of anticoagulants: Secondary | ICD-10-CM

## 2023-01-28 DIAGNOSIS — M7732 Calcaneal spur, left foot: Secondary | ICD-10-CM | POA: Diagnosis not present

## 2023-01-28 DIAGNOSIS — Z79890 Hormone replacement therapy (postmenopausal): Secondary | ICD-10-CM

## 2023-01-28 DIAGNOSIS — Z86010 Personal history of colonic polyps: Secondary | ICD-10-CM

## 2023-01-28 DIAGNOSIS — E119 Type 2 diabetes mellitus without complications: Secondary | ICD-10-CM | POA: Diagnosis not present

## 2023-01-28 DIAGNOSIS — Z79899 Other long term (current) drug therapy: Secondary | ICD-10-CM

## 2023-01-28 DIAGNOSIS — Z794 Long term (current) use of insulin: Secondary | ICD-10-CM

## 2023-01-28 DIAGNOSIS — M25572 Pain in left ankle and joints of left foot: Secondary | ICD-10-CM | POA: Diagnosis not present

## 2023-01-28 DIAGNOSIS — Z87448 Personal history of other diseases of urinary system: Secondary | ICD-10-CM

## 2023-01-28 DIAGNOSIS — I700 Atherosclerosis of aorta: Secondary | ICD-10-CM | POA: Diagnosis not present

## 2023-01-28 DIAGNOSIS — S90512A Abrasion, left ankle, initial encounter: Secondary | ICD-10-CM | POA: Diagnosis present

## 2023-01-28 DIAGNOSIS — I517 Cardiomegaly: Secondary | ICD-10-CM | POA: Diagnosis not present

## 2023-01-28 DIAGNOSIS — Z9981 Dependence on supplemental oxygen: Secondary | ICD-10-CM | POA: Diagnosis not present

## 2023-01-28 DIAGNOSIS — Z823 Family history of stroke: Secondary | ICD-10-CM

## 2023-01-28 DIAGNOSIS — Z818 Family history of other mental and behavioral disorders: Secondary | ICD-10-CM

## 2023-01-28 DIAGNOSIS — J9611 Chronic respiratory failure with hypoxia: Secondary | ICD-10-CM | POA: Diagnosis present

## 2023-01-28 DIAGNOSIS — M8580 Other specified disorders of bone density and structure, unspecified site: Secondary | ICD-10-CM | POA: Diagnosis present

## 2023-01-28 DIAGNOSIS — G894 Chronic pain syndrome: Secondary | ICD-10-CM | POA: Diagnosis present

## 2023-01-28 DIAGNOSIS — Z833 Family history of diabetes mellitus: Secondary | ICD-10-CM

## 2023-01-28 DIAGNOSIS — E669 Obesity, unspecified: Secondary | ICD-10-CM | POA: Diagnosis present

## 2023-01-28 DIAGNOSIS — M439 Deforming dorsopathy, unspecified: Secondary | ICD-10-CM | POA: Diagnosis not present

## 2023-01-28 DIAGNOSIS — E1140 Type 2 diabetes mellitus with diabetic neuropathy, unspecified: Secondary | ICD-10-CM | POA: Diagnosis present

## 2023-01-28 DIAGNOSIS — M79662 Pain in left lower leg: Secondary | ICD-10-CM | POA: Diagnosis not present

## 2023-01-28 DIAGNOSIS — M79672 Pain in left foot: Secondary | ICD-10-CM | POA: Diagnosis not present

## 2023-01-28 DIAGNOSIS — I110 Hypertensive heart disease with heart failure: Secondary | ICD-10-CM | POA: Diagnosis not present

## 2023-01-28 DIAGNOSIS — S92515A Nondisplaced fracture of proximal phalanx of left lesser toe(s), initial encounter for closed fracture: Secondary | ICD-10-CM | POA: Diagnosis not present

## 2023-01-28 DIAGNOSIS — Z7951 Long term (current) use of inhaled steroids: Secondary | ICD-10-CM

## 2023-01-28 DIAGNOSIS — E875 Hyperkalemia: Secondary | ICD-10-CM | POA: Diagnosis present

## 2023-01-28 DIAGNOSIS — Z6836 Body mass index (BMI) 36.0-36.9, adult: Secondary | ICD-10-CM

## 2023-01-28 DIAGNOSIS — G8929 Other chronic pain: Secondary | ICD-10-CM | POA: Diagnosis not present

## 2023-01-28 DIAGNOSIS — Z87440 Personal history of urinary (tract) infections: Secondary | ICD-10-CM

## 2023-01-28 DIAGNOSIS — I4811 Longstanding persistent atrial fibrillation: Secondary | ICD-10-CM

## 2023-01-28 DIAGNOSIS — E785 Hyperlipidemia, unspecified: Secondary | ICD-10-CM | POA: Diagnosis present

## 2023-01-28 DIAGNOSIS — M1712 Unilateral primary osteoarthritis, left knee: Secondary | ICD-10-CM | POA: Diagnosis not present

## 2023-01-28 DIAGNOSIS — J9621 Acute and chronic respiratory failure with hypoxia: Secondary | ICD-10-CM | POA: Diagnosis not present

## 2023-01-28 DIAGNOSIS — Z820 Family history of epilepsy and other diseases of the nervous system: Secondary | ICD-10-CM

## 2023-01-28 DIAGNOSIS — M25562 Pain in left knee: Secondary | ICD-10-CM | POA: Diagnosis not present

## 2023-01-28 LAB — CBC
HCT: 38.3 % (ref 36.0–46.0)
Hemoglobin: 11.8 g/dL — ABNORMAL LOW (ref 12.0–15.0)
MCH: 27.9 pg (ref 26.0–34.0)
MCHC: 30.8 g/dL (ref 30.0–36.0)
MCV: 90.5 fL (ref 80.0–100.0)
Platelets: 217 10*3/uL (ref 150–400)
RBC: 4.23 MIL/uL (ref 3.87–5.11)
RDW: 15 % (ref 11.5–15.5)
WBC: 10.7 10*3/uL — ABNORMAL HIGH (ref 4.0–10.5)
nRBC: 0 % (ref 0.0–0.2)

## 2023-01-28 LAB — BRAIN NATRIURETIC PEPTIDE: B Natriuretic Peptide: 355.3 pg/mL — ABNORMAL HIGH (ref 0.0–100.0)

## 2023-01-28 LAB — BASIC METABOLIC PANEL
Anion gap: 7 (ref 5–15)
BUN: 18 mg/dL (ref 8–23)
CO2: 26 mmol/L (ref 22–32)
Calcium: 9 mg/dL (ref 8.9–10.3)
Chloride: 99 mmol/L (ref 98–111)
Creatinine, Ser: 0.88 mg/dL (ref 0.44–1.00)
GFR, Estimated: >60 mL/min (ref >60)
Glucose, Bld: 383 mg/dL — ABNORMAL HIGH (ref 70–99)
Potassium: 5.4 mmol/L — ABNORMAL HIGH (ref 3.5–5.1)
Sodium: 132 mmol/L — ABNORMAL LOW (ref 135–145)

## 2023-01-28 LAB — POTASSIUM: Potassium: 4.5 mmol/L (ref 3.5–5.1)

## 2023-01-28 LAB — GLUCOSE, CAPILLARY
Glucose-Capillary: 154 mg/dL — ABNORMAL HIGH (ref 70–99)
Glucose-Capillary: 226 mg/dL — ABNORMAL HIGH (ref 70–99)

## 2023-01-28 LAB — RESP PANEL BY RT-PCR (RSV, FLU A&B, COVID)  RVPGX2
Influenza A by PCR: NEGATIVE
Influenza B by PCR: NEGATIVE
Resp Syncytial Virus by PCR: NEGATIVE
SARS Coronavirus 2 by RT PCR: NEGATIVE

## 2023-01-28 LAB — TROPONIN I (HIGH SENSITIVITY)
Troponin I (High Sensitivity): 21 ng/L — ABNORMAL HIGH (ref <18)
Troponin I (High Sensitivity): 28 ng/L — ABNORMAL HIGH (ref <18)

## 2023-01-28 MED ORDER — MORPHINE SULFATE ER 15 MG PO TBCR: 15.0000 mg | EXTENDED_RELEASE_TABLET | Freq: Two times a day (BID) | ORAL | Status: DC

## 2023-01-28 MED ORDER — OXCARBAZEPINE 150 MG PO TABS
150.0000 mg | ORAL_TABLET | Freq: Two times a day (BID) | ORAL | Status: DC
  Administered 2023-01-28 – 2023-01-30 (×4): 150 mg via ORAL
  Filled 2023-01-28 (×4): qty 1

## 2023-01-28 MED ORDER — FUROSEMIDE 10 MG/ML IJ SOLN
40.0000 mg | Freq: Once | INTRAMUSCULAR | Status: AC
  Administered 2023-01-28: 40 mg via INTRAVENOUS
  Filled 2023-01-28: qty 4

## 2023-01-28 MED ORDER — METOPROLOL TARTRATE 50 MG PO TABS
50.0000 mg | ORAL_TABLET | Freq: Two times a day (BID) | ORAL | Status: DC
  Administered 2023-01-28 – 2023-01-30 (×4): 50 mg via ORAL
  Filled 2023-01-28 (×4): qty 1

## 2023-01-28 MED ORDER — IPRATROPIUM-ALBUTEROL 0.5-2.5 (3) MG/3ML IN SOLN
3.0000 mL | RESPIRATORY_TRACT | Status: DC | PRN
  Administered 2023-01-29: 3 mL via RESPIRATORY_TRACT
  Filled 2023-01-28: qty 3

## 2023-01-28 MED ORDER — VITAMIN B-12 1000 MCG PO TABS
1000.0000 ug | ORAL_TABLET | Freq: Every day | ORAL | Status: DC
  Administered 2023-01-29 – 2023-01-30 (×2): 1000 ug via ORAL
  Filled 2023-01-28 (×2): qty 1

## 2023-01-28 MED ORDER — ACETAMINOPHEN 650 MG RE SUPP: 650.0000 mg | Freq: Four times a day (QID) | RECTAL | Status: DC | PRN

## 2023-01-28 MED ORDER — LORAZEPAM 0.5 MG PO TABS
0.5000 mg | ORAL_TABLET | Freq: Two times a day (BID) | ORAL | Status: DC
  Administered 2023-01-28 – 2023-01-30 (×4): 0.5 mg via ORAL
  Filled 2023-01-28 (×4): qty 1

## 2023-01-28 MED ORDER — INSULIN ASPART 100 UNIT/ML IJ SOLN
0.0000 [IU] | Freq: Three times a day (TID) | INTRAMUSCULAR | Status: DC
  Administered 2023-01-28: 3 [IU] via SUBCUTANEOUS
  Administered 2023-01-29: 8 [IU] via SUBCUTANEOUS
  Administered 2023-01-29: 5 [IU] via SUBCUTANEOUS
  Administered 2023-01-29: 8 [IU] via SUBCUTANEOUS
  Administered 2023-01-30: 3 [IU] via SUBCUTANEOUS
  Administered 2023-01-30: 8 [IU] via SUBCUTANEOUS
  Filled 2023-01-28: qty 0.15

## 2023-01-28 MED ORDER — PREGABALIN 75 MG PO CAPS
200.0000 mg | ORAL_CAPSULE | Freq: Every day | ORAL | Status: DC
  Administered 2023-01-28 – 2023-01-29 (×2): 200 mg via ORAL
  Filled 2023-01-28 (×2): qty 1

## 2023-01-28 MED ORDER — ARIPIPRAZOLE 2 MG PO TABS
2.0000 mg | ORAL_TABLET | Freq: Every day | ORAL | Status: DC
  Administered 2023-01-28 – 2023-01-29 (×2): 2 mg via ORAL
  Filled 2023-01-28 (×2): qty 1

## 2023-01-28 MED ORDER — ONDANSETRON HCL 4 MG/2ML IJ SOLN: 4.0000 mg | Freq: Four times a day (QID) | INTRAMUSCULAR | Status: DC | PRN

## 2023-01-28 MED ORDER — ESCITALOPRAM OXALATE 10 MG PO TABS
10.0000 mg | ORAL_TABLET | Freq: Every day | ORAL | Status: DC
  Administered 2023-01-29 – 2023-01-30 (×2): 10 mg via ORAL
  Filled 2023-01-28 (×2): qty 1

## 2023-01-28 MED ORDER — ACETAMINOPHEN 325 MG PO TABS
650.0000 mg | ORAL_TABLET | Freq: Four times a day (QID) | ORAL | Status: DC | PRN
  Administered 2023-01-29 (×3): 650 mg via ORAL
  Filled 2023-01-28 (×4): qty 2

## 2023-01-28 MED ORDER — DOXEPIN HCL 10 MG PO CAPS
10.0000 mg | ORAL_CAPSULE | Freq: Every day | ORAL | Status: DC
  Administered 2023-01-28 – 2023-01-29 (×2): 10 mg via ORAL
  Filled 2023-01-28 (×2): qty 1

## 2023-01-28 MED ORDER — MORPHINE SULFATE ER 15 MG PO TBCR
15.0000 mg | EXTENDED_RELEASE_TABLET | Freq: Two times a day (BID) | ORAL | Status: DC
  Administered 2023-01-28 – 2023-01-30 (×4): 15 mg via ORAL
  Filled 2023-01-28 (×4): qty 1

## 2023-01-28 MED ORDER — MOMETASONE FURO-FORMOTEROL FUM 100-5 MCG/ACT IN AERO
2.0000 | INHALATION_SPRAY | Freq: Two times a day (BID) | RESPIRATORY_TRACT | Status: DC
  Administered 2023-01-28 – 2023-01-30 (×4): 2 via RESPIRATORY_TRACT
  Filled 2023-01-28: qty 8.8

## 2023-01-28 MED ORDER — ONDANSETRON HCL 4 MG PO TABS: 4.0000 mg | ORAL_TABLET | Freq: Four times a day (QID) | ORAL | Status: DC | PRN

## 2023-01-28 MED ORDER — INSULIN GLARGINE-YFGN 100 UNIT/ML ~~LOC~~ SOLN
30.0000 [IU] | Freq: Two times a day (BID) | SUBCUTANEOUS | Status: DC
  Administered 2023-01-28 – 2023-01-30 (×4): 30 [IU] via SUBCUTANEOUS
  Filled 2023-01-28 (×5): qty 0.3

## 2023-01-28 MED ORDER — APIXABAN 5 MG PO TABS
5.0000 mg | ORAL_TABLET | Freq: Two times a day (BID) | ORAL | Status: DC
  Administered 2023-01-28 – 2023-01-30 (×4): 5 mg via ORAL
  Filled 2023-01-28 (×4): qty 1

## 2023-01-28 NOTE — Telephone Encounter (Signed)
Patients spouse called in to report patient being in the emergency department due to acute shortness of breath and syncopal episode. Patient was due to come in for consultation with Dr. Roselind Messier. Consultation will be rescheduled.

## 2023-01-28 NOTE — ED Provider Notes (Signed)
[place] EMERGENCY DEPARTMENT AT [facility] Provider Note   CSN: [id number] Arrival date & time: 01/28/23  3664     History  Chief Complaint  Patient presents with   Shortness of Breath    Amber Stephenson is a 77 y.o. female.  Presenting emergency department with chief complaint of shortness of breath.  Patient reports that over the past several days she has had increasing dyspnea.  Seemingly dyspnea is on exertion.  This morning it seemed to acutely worsen while she was getting ready to come to her doctor's appointment.  Denies any chest pain or palpitations with these events.  She reports that she cannot walk as far as she could before secondary to dyspnea.  She is on supplemental oxygen, reports that she was wearing it just at night when she left the hospital, but has been having to frequently use it throughout the day.  Denies any fevers, chills, cough.  No abdominal pain.  No dysuria.  Of note patient also complains of pain to her left ankle and left pinky toe after falling this morning trying to get into the car.  Did not hit her head, no LOC.  Has been says it seems like her legs are weak and just gave out from her.   Shortness of Breath      Home Medications Prior to Admission medications   Medication Sig Start Date End Date Taking? Authorizing Provider  Acetaminophen (TYLENOL PO) Take 2 tablets by mouth as needed (pain/headache).    [provider]  albuterol (VENTOLIN HFA) 108 (90 Base) MCG/ACT inhaler Inhale 2 puffs into the lungs every 6 (six) hours as needed for wheezing or shortness of breath. 08/09/20   Sharee Holster, NP  allopurinol (ZYLOPRIM) 100 MG tablet Take 100 mg by mouth daily. 10/22/22   [provider]  apixaban (ELIQUIS) 5 MG TABS tablet Take 1 tablet (5 mg total) by mouth 2 (two) times daily. 09/09/22   Quintella Reichert, MD  ARIPiprazole (ABILIFY) 2 MG tablet Take 2 mg by mouth at bedtime. 10/07/21   [provider]   atorvastatin (LIPITOR) 40 MG tablet Take 1 tablet (40 mg total) by mouth daily. 01/19/23 04/19/23  Narda Bonds, MD  BELSOMRA 20 MG TABS Take 1 tablet by mouth at bedtime as needed.    [provider]  Continuous Blood Gluc Sensor (DEXCOM G7 SENSOR) MISC Change sensor every 10 days Patient taking differently: 1 each by Other route See admin instructions. Change sensor every 10 days 05/20/22   Shamleffer, Konrad Dolores, MD  cyanocobalamin (VITAMIN B12) 1000 MCG tablet Take 1 tablet (1,000 mcg total) by mouth daily. 09/04/22   Ghimire, Werner Lean, MD  denosumab (PROLIA) 60 MG/ML SOSY injection Inject 60 mg into the skin every 6 (six) months. 08/14/20   Mechele Claude, MD  diphenhydrAMINE (BENADRYL) 50 MG tablet Take 1 tablet (50 mg total) by mouth once for 1 dose. Take 1 hour prior to CT scan 01/05/23 01/05/23  [name], MD  doxepin (SINEQUAN) 10 MG capsule Take 10 mg by mouth at bedtime. 11/27/22   [provider]  escitalopram (LEXAPRO) 10 MG tablet Take 10 mg by mouth daily.    [provider]  fluticasone-salmeterol (ADVAIR DISKUS) 100-50 MCG/ACT AEPB Inhale 1 puff into the lungs 2 (two) times daily. 09/11/22   Parrett, Virgel Bouquet, NP  furosemide (LASIX) 40 MG tablet Take 1 tablet (40 mg total) by mouth 2 (two) times daily. 01/18/23 04/18/23  Narda Bonds, MD  Galcanezumab-gnlm [facility]) 120 MG/ML SOAJ INJECT CONTENTS ON 1 PEN(120MG ) INTO THE SKIN ONCE MONTHLY 01/23/23   Marcos Eke, PA-C  insulin glargine (LANTUS SOLOSTAR) 100 UNIT/ML Solostar Pen Inject 35 Units into the skin 2 (two) times daily. 12/25/22   Mechele Claude, MD  insulin lispro (HUMALOG KWIKPEN) 100 UNIT/ML KwikPen Max daily 50 units Patient taking differently: Inject 4-8 Units into the skin 3 (three) times daily. 05/28/22   Shamleffer, Konrad Dolores, MD  Insulin Pen Needle 29G X MISC 1 Device by Does not apply route daily in the afternoon. 05/28/22   Shamleffer, Konrad Dolores, MD  LORazepam  (ATIVAN) 1 MG tablet Take 0.5 tablets (0.5 mg total) by mouth 2 (two) times daily. 09/04/22   Ghimire, Werner Lean, MD  metFORMIN (GLUCOPHAGE-XR) 500 MG 24 hr tablet Take 2 tablets (1,000 mg total) by mouth daily with breakfast. 12/08/22   Shamleffer, Konrad Dolores, MD  metoprolol tartrate (LOPRESSOR) 50 MG tablet Take 1 tablet (50 mg total) by mouth 2 (two) times daily. 01/06/23   Quintella Reichert, MD  morphine (MS CONTIN) 15 MG 12 hr tablet Take 1 tablet (15 mg total) by mouth every 12 (twelve) hours. For chronic pain- can refill by 01/13/23 12/22/22   Genice Rouge, MD  naloxone [facility]) nasal spray 4 mg/0.1 mL Place 1 spray into the nose once. 08/08/21   [provider]  norethindrone (AYGESTIN) 5 MG tablet Take 2 tablets (10 mg total) by mouth daily. 12/30/22 01/29/23  [name], DO  nystatin (MYCOSTATIN/NYSTOP) powder Apply 1 Application topically as needed (rash). 06/16/22   Shamleffer, Konrad Dolores, MD  nystatin ointment (MYCOSTATIN) Apply 1 Application topically 2 (two) times daily. Patient taking differently: Apply 1 Application topically as needed (rash). 12/27/21   Adline Potter, NP  ondansetron (ZOFRAN-ODT) 8 MG disintegrating tablet Take 1 tablet (8 mg total) by mouth every 6 (six) hours as needed for nausea or vomiting. 01/13/22   Mechele Claude, MD  OXcarbazepine (TRILEPTAL) 150 MG tablet Take 1 tablet (150 mg total) by mouth 2 (two) times daily. 06/13/22   Marguerita Merles Latif, DO  pregabalin (LYRICA) 200 MG capsule Take 200 mg by mouth daily.    [provider]  senna-docusate (SENOKOT-S) 8.6-50 MG tablet Take 1 tablet by mouth at bedtime. Patient taking differently: Take 1 tablet by mouth as needed for moderate constipation. 06/13/22   Sheikh, Omair Latif, DO  tirzepatide [facility]) 5 MG/0.5ML Pen Inject 5 mg into the skin once a week. 11/11/22   Shamleffer, Konrad Dolores, MD  Vitamin D, Cholecalciferol, 25 MCG (1000 UT) TABS Take 1,000 Units by mouth daily in the  afternoon. 07/26/20   [provider]      Allergies    Iodine, Ivp dye [iodinated contrast media], Latex, and Tizanidine    Review of Systems   Review of Systems  Respiratory:  Positive for shortness of breath.   All other systems reviewed and are negative.   Physical Exam Updated Vital Signs BP 120/73 (BP Location: Right Arm)   Pulse 67   Temp 97.9 F (36.6 C) (Oral)   Resp 18   Ht 5\' 2"  (1.575 m)   Wt 81.4 kg   SpO2 100%   BMI 32.82 kg/m  Physical Exam Vitals and nursing note reviewed.  Constitutional:      Appearance: She is obese.  HENT:     Head: Normocephalic and atraumatic.  Cardiovascular:     Rate and Rhythm: Tachycardia  present. Rhythm irregular.  Pulmonary:     Effort: Pulmonary effort is normal.     Comments: Does not appear to be in respiratory distress.  With coarse breath sounds in bases. Chest:     Chest wall: No tenderness.  Abdominal:     Palpations: Abdomen is soft.  Musculoskeletal:     Cervical back: Normal range of motion.     Right lower leg: No edema.     Left lower leg: No edema.     Comments: Left ankle with some minor abrasions to the left lateral malleolus.  Some minor tenderness.  Also has some bruising and tenderness to the left pinky toe.  Skin:    General: Skin is warm.  Neurological:     General: No focal deficit present.     Mental Status: She is alert.  Psychiatric:        Mood and Affect: Mood normal.        Behavior: Behavior normal.     ED Results / Procedures / Treatments   Labs (all labs ordered are listed, but only abnormal results are displayed) Labs Reviewed  BASIC METABOLIC PANEL - Abnormal; Notable for the following components:      Result Value   Sodium 132 (*)    Potassium 5.4 (*)    Glucose, Bld 383 (*)    All other components within normal limits  CBC - Abnormal; Notable for the following components:   WBC 10.7 (*)    Hemoglobin 11.8 (*)    All other components within normal limits  BRAIN  NATRIURETIC PEPTIDE - Abnormal; Notable for the following components:   B Natriuretic Peptide 355.3 (*)    All other components within normal limits  TROPONIN I (HIGH SENSITIVITY) - Abnormal; Notable for the following components:   Troponin I (High Sensitivity) 21 (*)    All other components within normal limits  TROPONIN I (HIGH SENSITIVITY) - Abnormal; Notable for the following components:   Troponin I (High Sensitivity) 28 (*)    All other components within normal limits  RESP PANEL BY RT-PCR (RSV, FLU A&B, COVID)  [id number]  BASIC METABOLIC PANEL  POTASSIUM    EKG EKG Interpretation Date/Time:  Wednesday January 28 2023 08:26:17 EDT Ventricular Rate:  134 PR Interval:    QRS Duration:  81 QT Interval:  308 QTC Calculation: 460 R Axis:   -63  Text Interpretation: Atrial fibrillation Ventricular premature complex Left anterior fascicular block Consider anterior infarct Nonspecific T abnormalities, lateral leads ST elevation, consider inferior injury Confirmed by Estanislado Pandy [phone]) on 01/28/2023 9:41:41 AM  Radiology DG Foot 2 Views Left  Result Date: 01/28/2023 CLINICAL DATA:  Fall.  Pain over small toe. EXAM: LEFT ANKLE - 2 VIEW; LEFT FOOT - 2 VIEW COMPARISON:  None Available. FINDINGS: There is subtle step-off along the distal articular surface of the proximal phalanx of fifth toe. Correlate clinically for minimally displaced fracture. No other acute fracture or dislocation. No aggressive osseous lesion. Ankle mortise appears intact. There are mild degenerative changes of imaged joints. Calcaneal spur noted along the Achilles tendon and Plantar aponeurosis attachment sites. No focal soft tissue swelling. No radiopaque foreign bodies. IMPRESSION: 1. Subtle step-off along the distal articular surface of the proximal phalanx of fifth toe. Correlate clinically for minimally displaced fracture. Electronically Signed   By: Jules Schick M.D.   On: 01/28/2023 10:16   DG Ankle 2 Views  Left  Result Date: 01/28/2023 CLINICAL DATA:  Fall.  Pain over  small toe. EXAM: LEFT ANKLE - 2 VIEW; LEFT FOOT - 2 VIEW COMPARISON:  None Available. FINDINGS: There is subtle step-off along the distal articular surface of the proximal phalanx of fifth toe. Correlate clinically for minimally displaced fracture. No other acute fracture or dislocation. No aggressive osseous lesion. Ankle mortise appears intact. There are mild degenerative changes of imaged joints. Calcaneal spur noted along the Achilles tendon and Plantar aponeurosis attachment sites. No focal soft tissue swelling. No radiopaque foreign bodies. IMPRESSION: 1. Subtle step-off along the distal articular surface of the proximal phalanx of fifth toe. Correlate clinically for minimally displaced fracture. Electronically Signed   By: Jules Schick M.D.   On: 01/28/2023 10:16   DG Chest 2 View  Result Date: 01/28/2023 CLINICAL DATA:  Shortness of breath EXAM: CHEST - 2 VIEW COMPARISON:  X-ray 01/15/2023 FINDINGS: Enlarged cardiopericardial silhouette. Prominence of the central vasculature. Bronchovascular crowding. No pneumothorax or effusion film is under penetrated. Overlapping cardiac leads. Right shoulder reverse arthroplasty identified. IMPRESSION: Enlarged heart. Prominence of the central vasculature but the lungs are underinflated. Under penetrated radiograph. Electronically Signed   By: Karen Kays M.D.   On: 01/28/2023 09:49    Procedures Procedures    Medications Ordered in ED Medications  morphine (MS CONTIN) 12 hr tablet 15 mg (has no administration in time range)  metoprolol tartrate (LOPRESSOR) tablet 50 mg (has no administration in time range)  ARIPiprazole (ABILIFY) tablet 2 mg (has no administration in time range)  LORazepam (ATIVAN) tablet 0.5 mg (has no administration in time range)  escitalopram (LEXAPRO) tablet 10 mg (has no administration in time range)  doxepin (SINEQUAN) capsule 10 mg (has no administration in time  range)  insulin glargine-yfgn (SEMGLEE) injection 30 Units (has no administration in time range)  apixaban (ELIQUIS) tablet 5 mg (has no administration in time range)  cyanocobalamin (VITAMIN B12) tablet 1,000 mcg (has no administration in time range)  OXcarbazepine (TRILEPTAL) tablet 150 mg (has no administration in time range)  pregabalin (LYRICA) capsule 200 mg (has no administration in time range)  ipratropium-albuterol (DUONEB) 0.5-2.5 (3) MG/3ML nebulizer solution 3 mL (has no administration in time range)  mometasone-formoterol (DULERA) 100-5 MCG/ACT inhaler 2 puff (has no administration in time range)  acetaminophen (TYLENOL) tablet 650 mg (has no administration in time range)    Or  acetaminophen (TYLENOL) suppository 650 mg (has no administration in time range)  ondansetron (ZOFRAN) tablet 4 mg (has no administration in time range)    Or  ondansetron (ZOFRAN) injection 4 mg (has no administration in time range)  insulin aspart (novoLOG) injection 0-15 Units (has no administration in time range)  furosemide (LASIX) injection 40 mg (40 mg Intravenous Given 01/28/23 1410)    ED Course/ Medical Decision Making/ A&P Clinical Course as of 01/28/23 1726  Wed Jan 28, 2023  0939 DG Chest 2 View Appears similar to prior on my independent interpretation. [TY]  1002 DG Chest 2 View IMPRESSION: Enlarged heart. Prominence of the central vasculature but the lungs are underinflated.  Under penetrated radiograph   [TY]  1025 DG Foot 2 Views Left IMPRESSION: 1. Subtle step-off along the distal articular surface of the proximal phalanx of fifth toe. Correlate clinically for minimally displaced fracture.   [TY]  [id number] Spoke with cardiology, Dr. Herbie Baltimore recommending IV diuresis and admission for Observation.  Team will likely see in the morning. [TY]    Clinical Course User Index [TY] Coral Spikes, DO  Medical Decision Making 77 year old female  present emergency department with increased shortness of breath.  Afebrile vital signs reassuring.  She is maintaining oxygen saturation on 3 L nasal cannula.  Reportedly on 2 L intermittently at home.  She has had worsening shortness of breath in the past few days, increasing oxygen requirements.  History points to a mild CHF exacerbation versus secondary to her heart rate as she did have a heart rate in the 140s.  Labs largely reassuring, mild elevation in troponin, but no ECG changes consistent with acute ischemic event.  BNP also elevated which would point towards a fluid overload status.  Mild hyponatremia hyperkalemia.  EKG with no evidence of changes with her hyperkalemia.  Slight leukocytosis, no overt source of infection.  Flu COVID-negative.  Case discussed with cardiology recommending admission.  IV Lasix ordered.  Amount and/or Complexity of Data Reviewed Independent Historian: spouse    Details: Reports presentation today similar to last time admitted.  External Data Reviewed: notes.    Details: Admitted 2 weeks ago for afib/flash pulmonary edema.  Labs: ordered. Radiology: ordered. Decision-making details documented in ED Course. ECG/medicine tests: ordered.  Risk Prescription drug management. Decision regarding hospitalization.           Final Clinical Impression(s) / ED Diagnoses Final diagnoses:  Closed nondisplaced fracture of proximal phalanx of lesser toe of left foot, initial encounter  Longstanding persistent atrial fibrillation (HCC)  Acute on chronic congestive heart failure, unspecified heart failure type [facility])    Rx / DC Orders ED Discharge Orders     None         Coral Spikes, DO 01/28/23 1726

## 2023-01-28 NOTE — H&P (Addendum)
History and Physical    Patient: Amber Stephenson JXB:[id number] DOB: 08-12-1945 DOA: 01/28/2023 DOS: the patient was seen and examined on 01/28/2023 PCP: Mechele Claude, MD  Patient coming from: Home  Chief Complaint:  Chief Complaint  Patient presents with   Shortness of Breath   HPI: Amber Stephenson is a 77 y.o. female with medical history significant of chronic diastolic heart failure, chronic atrial fibrillation on Eliquis, chronic respite failure with hypoxia on 2 L/min of oxygen, insulin-dependent type 2 diabetes, primary hypertension, hyperlipidemia, bipolar 1 disorder, endometrial cancer. Patient reports worsening dyspnea at rest and on exertion, prompting desire for ED evaluation. During attempts to get her to the ED, her legs buckled and she fell; no syncope. No chest pain, nausea, vomiting, abdominal pain, diarrhea or constipation. No fevers, chills. She sleeps on 4-5 pillows chronically.  Review of Systems: As mentioned in the history of present illness. All other systems reviewed and are negative. 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   Benign paroxysmal positional vertigo due to bilateral vestibular disorder 05/20/2019   Bipolar 1 disorder (HCC) 01/23/2015   with GAD, benzo dependence   BPPV (benign paroxysmal positional vertigo) 05/20/2019   CAD (coronary artery disease)    Nonobstructive; Managed by Dr. Purvis Sheffield   Cardiomegaly 01/12/2018   CHF (congestive heart failure) 06/08/2022   Chronic back pain 01/04/2015   Chronic constipation 04/25/2020   Chronic diastolic heart failure (HCC) 05/30/2015   Chronic pain  syndrome 08/22/2019   back pain, sacroiliitis   Chronic pain syndrome 08/22/2019   Chronic post-traumatic stress disorder (PTSD) 12/06/2020   Diabetic neuropathy (HCC) 02/06/2016   Dyslipidemia 09/24/2020   Dyspnea on exertion    Essential hypertension    Family history of coronary arteriosclerosis 05/30/2015   Functional diarrhea 10/26/2020   Head trauma 09/17/2020   Hemorrhoids 03/11/2022   Herpes genitalis in women 07/16/2015   History of adenomatous polyp of colon 05/21/2019   Overview:   03/31/17: Colonoscopy: nonadvanced adenoma, microscopic colitis, f/u 5 yrs, [facility]/GAP   Insomnia 01/23/2015   Lipoma 02/08/2015   Migraine headache with aura 02/12/2016   Myofascial pain dysfunction syndrome 08/22/2019   Myofascial pain dysfunction syndrome 08/22/2019   Non-alcoholic fatty liver disease 01/12/2018   Opioid dependence (HCC) 12/06/2020   OSA (obstructive sleep apnea) 02/24/2019   10/09/2018 - HST  - AHI 40.6    Osteopenia 12/06/2020   Rx alendronate 35 mg.   Pinguecula 12/06/2020   Postcoital bleeding 12/06/2020   Presbyopia 12/06/2020   Pulmonary hypertension    RLS (restless legs syndrome) 04/27/2015   Superficial fungus infection of skin 09/03/2021   Tremor, essential 12/11/2021   Type II diabetes mellitus (HCC) 09/24/2020   Past Surgical History:  Procedure Laterality Date   BREAST REDUCTION SURGERY     EYE SURGERY Right    cateracts   HAMMER TOE SURGERY     LEFT HEART CATH AND CORONARY ANGIOGRAPHY N/A 02/03/2018   Procedure: LEFT HEART CATH AND CORONARY ANGIOGRAPHY;  Surgeon: [name], Peter M, MD;  Location: [facility] INVASIVE CV LAB;  Service: Cardiovascular;  Laterality: N/A;   REVERSE SHOULDER ARTHROPLASTY Right 08/19/2019   Procedure: REVERSE SHOULDER ARTHROPLASTY;  Surgeon: Beverely Low, MD;  Location: [facility] ORS;  Service: Orthopedics;  Laterality: Right;  interscalene block   SHOULDER SURGERY Right    "I BROKE MY SHOUDLER   THIGH SURGERY     "TO REMOVE A TUMOR "    Social History:  reports that she has never smoked. She has never used smokeless tobacco. She reports that she does not currently use alcohol. She reports that she does not use drugs.  Allergies  Allergen Reactions   Iodine Anaphylaxis   Ivp Dye [Iodinated Contrast Media] Anaphylaxis and Swelling    Throat closes    Latex Other (See Comments)    Latex tape pulls skin with it   Tizanidine Other (See Comments)    Weakness, goofy, bad dreams    Family History  Problem Relation Age of Onset   Diabetes Mother    Heart disease Mother    Alzheimer's disease Father    Heart disease Sister        CABG   Diabetes Sister    Alcohol abuse Sister    Stroke Brother    Heart disease Brother    Mental illness Brother    Diabetes Brother    Breast cancer Neg Hx    Ovarian cancer Neg Hx    Colon cancer Neg Hx    Endometrial cancer Neg Hx     Prior to Admission medications   Medication Sig Start Date End Date Taking? Authorizing Provider  Acetaminophen (TYLENOL PO) Take 2 tablets by mouth as needed (pain/headache).    [provider]  albuterol (VENTOLIN HFA) 108 (90 Base) MCG/ACT inhaler Inhale 2 puffs into the lungs every 6 (six) hours as needed for wheezing or shortness of breath. 08/09/20   Sharee Holster, NP  allopurinol (ZYLOPRIM) 100 MG tablet Take 100 mg by mouth daily. 10/22/22   [provider]  apixaban (ELIQUIS) 5 MG TABS tablet Take 1 tablet (5 mg total) by mouth 2 (two) times daily. 09/09/22   Quintella Reichert, MD  ARIPiprazole (ABILIFY) 2 MG tablet Take 2 mg by mouth at bedtime. 10/07/21   [provider]  atorvastatin (LIPITOR) 40 MG tablet Take 1 tablet (40 mg total) by mouth daily. 01/19/23 04/19/23  Narda Bonds, MD  BELSOMRA 20 MG TABS Take 1 tablet by mouth at bedtime as needed.    [provider]  Continuous Blood Gluc Sensor (DEXCOM G7 SENSOR) MISC Change sensor every 10 days Patient taking differently: 1 each by Other route See  admin instructions. Change sensor every 10 days 05/20/22   Shamleffer, Konrad Dolores, MD  cyanocobalamin (VITAMIN B12) 1000 MCG tablet Take 1 tablet (1,000 mcg total) by mouth daily. 09/04/22   Ghimire, Werner Lean, MD  denosumab (PROLIA) 60 MG/ML SOSY injection Inject 60 mg into the skin every 6 (six) months. 08/14/20   Mechele Claude, MD  diphenhydrAMINE (BENADRYL) 50 MG tablet Take 1 tablet (50 mg total) by mouth once for 1 dose. Take 1 hour prior to CT scan 01/05/23 01/05/23  [name], MD  doxepin (SINEQUAN) 10 MG capsule Take 10 mg by mouth at bedtime. 11/27/22   [provider]  escitalopram (LEXAPRO) 10 MG tablet Take 10 mg by mouth daily.    [provider]  fluticasone-salmeterol (ADVAIR DISKUS) 100-50 MCG/ACT AEPB Inhale 1 puff into the lungs 2 (two) times daily. 09/11/22   Parrett, Virgel Bouquet, NP  furosemide (LASIX) 40 MG tablet Take 1 tablet (40 mg total) by mouth 2 (two) times  daily. 01/18/23 04/18/23  Narda Bonds, MD  Galcanezumab-gnlm [facility]) 120 MG/ML SOAJ INJECT CONTENTS ON 1 PEN(120MG ) INTO THE SKIN ONCE MONTHLY 01/23/23   Marcos Eke, PA-C  insulin glargine (LANTUS SOLOSTAR) 100 UNIT/ML Solostar Pen Inject 35 Units into the skin 2 (two) times daily. 12/25/22   Mechele Claude, MD  insulin lispro (HUMALOG KWIKPEN) 100 UNIT/ML KwikPen Max daily 50 units Patient taking differently: Inject 4-8 Units into the skin 3 (three) times daily. 05/28/22   Shamleffer, Konrad Dolores, MD  Insulin Pen Needle 29G X MISC 1 Device by Does not apply route daily in the afternoon. 05/28/22   Shamleffer, Konrad Dolores, MD  LORazepam (ATIVAN) 1 MG tablet Take 0.5 tablets (0.5 mg total) by mouth 2 (two) times daily. 09/04/22   Ghimire, Werner Lean, MD  metFORMIN (GLUCOPHAGE-XR) 500 MG 24 hr tablet Take 2 tablets (1,000 mg total) by mouth daily with breakfast. 12/08/22   Shamleffer, Konrad Dolores, MD  metoprolol tartrate (LOPRESSOR) 50 MG tablet Take 1 tablet (50 mg total) by mouth 2  (two) times daily. 01/06/23   Quintella Reichert, MD  morphine (MS CONTIN) 15 MG 12 hr tablet Take 1 tablet (15 mg total) by mouth every 12 (twelve) hours. For chronic pain- can refill by 01/13/23 12/22/22   Genice Rouge, MD  naloxone [facility]) nasal spray 4 mg/0.1 mL Place 1 spray into the nose once. 08/08/21   [provider]  norethindrone (AYGESTIN) 5 MG tablet Take 2 tablets (10 mg total) by mouth daily. 12/30/22 01/29/23  [name], DO  nystatin (MYCOSTATIN/NYSTOP) powder Apply 1 Application topically as needed (rash). 06/16/22   Shamleffer, Konrad Dolores, MD  nystatin ointment (MYCOSTATIN) Apply 1 Application topically 2 (two) times daily. Patient taking differently: Apply 1 Application topically as needed (rash). 12/27/21   Adline Potter, NP  ondansetron (ZOFRAN-ODT) 8 MG disintegrating tablet Take 1 tablet (8 mg total) by mouth every 6 (six) hours as needed for nausea or vomiting. 01/13/22   Mechele Claude, MD  OXcarbazepine (TRILEPTAL) 150 MG tablet Take 1 tablet (150 mg total) by mouth 2 (two) times daily. 06/13/22   Marguerita Merles Latif, DO  pregabalin (LYRICA) 200 MG capsule Take 200 mg by mouth daily.    [provider]  senna-docusate (SENOKOT-S) 8.6-50 MG tablet Take 1 tablet by mouth at bedtime. Patient taking differently: Take 1 tablet by mouth as needed for moderate constipation. 06/13/22   Sheikh, Omair Latif, DO  tirzepatide [facility]) 5 MG/0.5ML Pen Inject 5 mg into the skin once a week. 11/11/22   Shamleffer, Konrad Dolores, MD  Vitamin D, Cholecalciferol, 25 MCG (1000 UT) TABS Take 1,000 Units by mouth daily in the afternoon. 07/26/20   [provider]    Physical Exam: Vitals:   01/28/23 0900 01/28/23 0916 01/28/23 1030 01/28/23 1200  BP: (!) 155/95  114/77 117/69  Pulse: 86  67 87  Resp: (!) 28  (!) 27 (!) 23  Temp:      TempSrc:      SpO2: 100%  100% 99%  Weight:  88.9 kg    Height:  5\' 2"  (1.575 m)     General exam: Appears calm and  comfortable and in no acute distress. Conversant Respiratory: Clear to auscultation. Respiratory effort normal with no intercostal retractions or use of accessory muscles Cardiovascular: S1 & S2 heard, RRR. No murmurs, rubs, gallops or clicks. No LE edema Gastrointestinal: Abdomen is non-distended, soft and non-tender. No masses felt. Normal bowel sounds heard  Neurologic: No focal neurological deficits Musculoskeletal: No calf tenderness Skin: No cyanosis. No new rashes Psychiatry: Alert and oriented x4. Memory intact. Mood & affect appropriate  Data Reviewed: There are no new results to review at this time.  Assessment and Plan:  Chronic respiratory failure with hypoxia Hypoxia Patient recently weaned to room air from prior admission. Patient placed on 4 L/min of [place] this admission. -Wean to room air   Acute on chronic diastolic heart failure Unclear trigger. Recent echocardiogram was significant for an LVEF of 60 to 65% with indeterminant diastolic function.  Cardiology consulted and patient was started on Lasix IV diuresis. Last discharge weight of 86.1 kg. Weight documented as 88.9 kg now. -Strict in/out -Daily weights -Follow-up cardiology recommendations pending -Will likely re-dose Lasix in AM  Hyperkalemia Mild. Resolved with Lasix.  Permanent atrial fibrillation Patient is managed on metoprolol 50 mg twice daily and Eliquis as an outpatient. -Continue metoprolol and Eliquis  Foot fracture Fracture involves the proximal phalanx of fifth toe. Post-op shoe ordered with recommendation for orthopedic surgery follow-up. Discussed with Dr. Eulah Pont.  Dependent diabetes mellitus type 2 Uncontrolled with hemoglobin A1c of 11.3%.  Patient is on insulin glargine 35 units twice daily in addition to insulin lispro 3 times daily via sliding scale, metformin. -insulin glargine 30 units BID and SSI  Chronic pain Continue MS Contin 15 mg twice daily   Hyponatremia Appears to be a  component of pseudohyponatremia secondary to hyperglycemia.  Improved.  Corrected sodium of 137.  Nonobstructive CAD Noted.  Patient with mildly elevated troponin, unlikely ACS. Negative delta.   Hyperlipidemia Continue Lipitor.   Anxiety Continue home Ativan.  Endometrial cancer Patient follows with Dr. [name], gynecology oncology   Obesity Estimated body mass index is 32.82 kg/m as calculated from the following:   Height as of this encounter: 5\' 2"  (1.575 m).   Weight as of this encounter: 81.4 kg.    Advance Care Planning:   Code Status: Do not attempt resuscitation (DNR) PRE-ARREST INTERVENTIONS DESIRED  Consults: Cardiology  Family Communication: Husband at bedside   Author: Jacquelin Hawking, MD 01/28/2023 2:16 PM  For on call review [email or web address].

## 2023-01-28 NOTE — ED Notes (Signed)
ED TO INPATIENT HANDOFF REPORT  Name/Age/Gender [name] 77 y.o. female  Code Status Code Status History     Date Active Date Inactive Code Status Order ID Comments User Context   01/12/2023 2014 01/18/2023 1835 Full Code [id number]  Anselm Jungling, DO ED   11/09/2022 1254 11/11/2022 2155 Full Code [id number]  Clydie Braun, MD ED   09/01/2022 1845 09/04/2022 1715 Full Code [id number]  Lanae Boast, MD Inpatient   06/27/2022 1744 06/29/2022 2006 Full Code [id number]  Levie Heritage, DO ED   06/08/2022 1632 06/13/2022 2018 Full Code [id number]  Ollen Bowl, MD Inpatient   12/13/2020 2146 12/16/2020 1927 Full Code [id number]  Frankey Shown, DO ED   04/23/2020 1604 04/24/2020 1916 Full Code [id number]  Shon Hale, MD ED   08/22/2019 2321 08/23/2019 1925 Full Code [id number]  Bobette Mo, MD ED   08/19/2019 1109 08/20/2019 1740 Full Code [id number]  Beverely Low, MD Inpatient   02/01/2019 1935 02/04/2019 1808 Full Code [id number]  Onnie Boer, MD Inpatient   01/21/2019 1716 01/23/2019 1524 Full Code [id number]  Darlin Drop, DO Inpatient   03/06/2018 0231 03/08/2018 1646 Full Code [id number]  Hillary Bow, DO ED   02/03/2018 1416 02/03/2018 1935 Full Code [id number]  [name], Peter M, MD Inpatient   05/19/2017 1955 05/20/2017 1956 Full Code [id number]  Briscoe Deutscher, MD ED   10/01/2015 1255 10/03/2015 1330 Full Code [id number]  Cathren Laine, MD ED   05/30/2015 0205 05/30/2015 2010 Full Code [id number]  Carron Curie, MD ED    Questions for Most Recent Historical Code Status (Order [id number])     Question Answer   By: Consent: discussion documented in EHR            Home/SNF/Other Home  Chief Complaint Acute heart failure (HCC) [I50.9]  Level of Care/Admitting Diagnosis ED Disposition     ED Disposition  Admit   Condition  --   Comment  Hospital Area: [facility] [[id number]]  Level of Care: Telemetry [5]  Admit to tele based on following criteria: Acute  CHF  Admit to tele based on following criteria: Complex arrhythmia (Bradycardia/Tachycardia)  [facility] place patient in [facility] at [facility] or [facility] if equivalent level of care is available:: No  Covid Evaluation: Confirmed COVID Negative  Diagnosis: Acute heart failure [facility]) [[id number]]  Admitting Physician: Narda Bonds [phone]  Attending Physician: Narda Bonds [phone]          Medical History Past Medical History:  Diagnosis Date   Acquired hammer toe 12/06/2020   Acute metabolic encephalopathy 06/27/2022   Allergic rhinitis 02/24/2019   Ambulatory dysfunction 04/23/2020   Atrial fibrillation with RVR (HCC) 05/19/2017   Back pain/sacroiliitis--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   Benign paroxysmal positional vertigo due to bilateral vestibular disorder 05/20/2019   Bipolar 1 disorder (HCC) 01/23/2015   with GAD, benzo dependence   BPPV (benign paroxysmal positional vertigo) 05/20/2019   CAD (coronary artery disease)    Nonobstructive; Managed by Dr. Purvis Sheffield   Cardiomegaly 01/12/2018   CHF (congestive heart failure) 06/08/2022   Chronic back pain 01/04/2015   Chronic constipation 04/25/2020   Chronic diastolic heart failure (HCC) 05/30/2015   Chronic pain syndrome 08/22/2019   back pain, sacroiliitis   Chronic pain syndrome 08/22/2019   Chronic post-traumatic stress disorder (PTSD) 12/06/2020   Diabetic neuropathy (  HCC) 02/06/2016   Dyslipidemia 09/24/2020   Dyspnea on exertion    Essential hypertension    Family history of coronary arteriosclerosis 05/30/2015   Functional diarrhea 10/26/2020   Head trauma 09/17/2020   Hemorrhoids 03/11/2022   Herpes genitalis in women 07/16/2015   History of adenomatous polyp of colon 05/21/2019   Overview:   03/31/17: Colonoscopy: nonadvanced adenoma, microscopic colitis, f/u 5 yrs, [facility]/GAP   Insomnia 01/23/2015    Lipoma 02/08/2015   Migraine headache with aura 02/12/2016   Myofascial pain dysfunction syndrome 08/22/2019   Myofascial pain dysfunction syndrome 08/22/2019   Non-alcoholic fatty liver disease 01/12/2018   Opioid dependence (HCC) 12/06/2020   OSA (obstructive sleep apnea) 02/24/2019   10/09/2018 - HST  - AHI 40.6    Osteopenia 12/06/2020   Rx alendronate 35 mg.   Pinguecula 12/06/2020   Postcoital bleeding 12/06/2020   Presbyopia 12/06/2020   Pulmonary hypertension    RLS (restless legs syndrome) 04/27/2015   Superficial fungus infection of skin 09/03/2021   Tremor, essential 12/11/2021   Type II diabetes mellitus (HCC) 09/24/2020    Allergies Allergies  Allergen Reactions   Iodine Anaphylaxis   Ivp Dye [Iodinated Contrast Media] Anaphylaxis and Swelling    Throat closes    Latex Other (See Comments)    Latex tape pulls skin with it   Tizanidine Other (See Comments)    Weakness, goofy, bad dreams    IV Location/Drains/Wounds Patient Lines/Drains/Airways Status     Active Line/Drains/Airways     Name Placement date Placement time Site Days   Peripheral IV 01/28/23 20 G 1" Posterior;Right Forearm 01/28/23  1024  Forearm  less than 1            Labs/Imaging Results for orders placed or performed during the hospital encounter of 01/28/23 (from the past 48 hour(s))  Resp panel by RT-PCR (RSV, Flu A&B, Covid) Anterior Nasal Swab     Status: None   Collection Time: 01/28/23 10:12 AM   Specimen: Anterior Nasal Swab  Result Value Ref Range   SARS Coronavirus 2 by RT PCR NEGATIVE NEGATIVE    Comment: (NOTE) SARS-CoV-2 target nucleic acids are NOT DETECTED.  The SARS-CoV-2 RNA is generally detectable in upper respiratory specimens during the acute phase of infection. The lowest concentration of SARS-CoV-2 viral copies this assay can detect is 138 copies/mL. A negative result does not preclude SARS-Cov-2 infection and should not be used as the sole basis for treatment  or other patient management decisions. A negative result may occur with  improper specimen collection/handling, submission of specimen other than nasopharyngeal swab, presence of viral mutation(s) within the areas targeted by this assay, and inadequate number of viral copies(<138 copies/mL). A negative result must be combined with clinical observations, patient history, and epidemiological information. The expected result is Negative.  Fact Sheet for Patients:  BloggerCourse.com  Fact Sheet for Healthcare Providers:  SeriousBroker.it  This test is no t yet approved or cleared by the [place] FDA and  has been authorized for detection and/or diagnosis of SARS-CoV-2 by FDA under an Emergency Use Authorization (EUA). This EUA will remain  in effect (meaning this test can be used) for the duration of the COVID-19 declaration under Section 564(b)(1) of the Act, 21 U.S.C.section 360bbb-3(b)(1), unless the authorization is terminated  or revoked sooner.       Influenza A by PCR NEGATIVE NEGATIVE   Influenza B by PCR NEGATIVE NEGATIVE    Comment: (NOTE) The Xpert Xpress SARS-CoV-2/FLU/RSV plus  assay is intended as an aid in the diagnosis of influenza from Nasopharyngeal swab specimens and should not be used as a sole basis for treatment. Nasal washings and aspirates are unacceptable for Xpert Xpress SARS-CoV-2/FLU/RSV testing.  Fact Sheet for Patients: BloggerCourse.com  Fact Sheet for Healthcare Providers: SeriousBroker.it  This test is not yet approved or cleared by the [place] FDA and has been authorized for detection and/or diagnosis of SARS-CoV-2 by FDA under an Emergency Use Authorization (EUA). This EUA will remain in effect (meaning this test can be used) for the duration of the COVID-19 declaration under Section 564(b)(1) of the Act, 21 U.S.C. section  360bbb-3(b)(1), unless the authorization is terminated or revoked.     Resp Syncytial Virus by PCR NEGATIVE NEGATIVE    Comment: (NOTE) Fact Sheet for Patients: BloggerCourse.com  Fact Sheet for Healthcare Providers: SeriousBroker.it  This test is not yet approved or cleared by the [place] FDA and has been authorized for detection and/or diagnosis of SARS-CoV-2 by FDA under an Emergency Use Authorization (EUA). This EUA will remain in effect (meaning this test can be used) for the duration of the COVID-19 declaration under Section 564(b)(1) of the Act, 21 U.S.C. section 360bbb-3(b)(1), unless the authorization is terminated or revoked.  Performed at [facility], [address] [place], [place] [address]   Basic metabolic panel     Status: Abnormal   Collection Time: 01/28/23 10:25 AM  Result Value Ref Range   Sodium 132 (L) 135 - 145 mmol/L   Potassium 5.4 (H) 3.5 - 5.1 mmol/L   Chloride 99 98 - 111 mmol/L   CO2 26 22 - 32 mmol/L   Glucose, Bld 383 (H) 70 - 99 mg/dL    Comment: Glucose reference range applies only to samples taken after fasting for at least 8 hours.   BUN 18 8 - 23 mg/dL   Creatinine, Ser 5.36 0.44 - 1.00 mg/dL   Calcium 9.0 8.9 - 64.4 mg/dL   GFR, Estimated >03 >47 mL/min    Comment: (NOTE) Calculated using the CKD-EPI Creatinine Equation (2021)    Anion gap 7 5 - 15    Comment: Performed at [facility], [address], [place], [place] [address]  CBC     Status: Abnormal   Collection Time: 01/28/23 10:25 AM  Result Value Ref Range   WBC 10.7 (H) 4.0 - 10.5 K/uL   RBC 4.23 3.87 - 5.11 MIL/uL   Hemoglobin 11.8 (L) 12.0 - 15.0 g/dL   HCT 63.8 75.6 - 43.3 %   MCV 90.5 80.0 - 100.0 fL   MCH 27.9 26.0 - 34.0 pg   MCHC 30.8 30.0 - 36.0 g/dL   RDW 29.5 18.8 - 41.6 %   Platelets 217 150 - 400 K/uL   nRBC 0.0 0.0 - 0.2 %    Comment: Performed at [facility], [address] [place], [place] [address]  Troponin I (High Sensitivity)     Status: Abnormal   Collection Time: 01/28/23 10:25 AM  Result Value Ref Range   Troponin I (High Sensitivity) 21 (H) <18 ng/L    Comment: (NOTE) Elevated high sensitivity troponin I (hsTnI) values and significant  changes across serial measurements may suggest ACS but many other  chronic and acute conditions are known to elevate hsTnI results.  Refer to the "Links" section for chest pain algorithms and additional  guidance. Performed at [facility], [address] [place], [place]  [address]   Brain natriuretic peptide     Status: Abnormal   Collection Time: 01/28/23 10:25 AM  Result Value Ref Range   B Natriuretic Peptide 355.3 (H) 0.0 - 100.0 pg/mL    Comment: Performed at [facility], [address], [place], [place] [address]  Troponin I (High Sensitivity)     Status: Abnormal   Collection Time: 01/28/23 12:05 PM  Result Value Ref Range   Troponin I (High Sensitivity) 28 (H) <18 ng/L    Comment: (NOTE) Elevated high sensitivity troponin I (hsTnI) values and significant  changes across serial measurements may suggest ACS but many other  chronic and acute conditions are known to elevate hsTnI results.  Refer to the "Links" section for chest pain algorithms and additional  guidance. Performed at [facility], [address], [place], [place] [address]    *Note: Due to a large number of results and/or encounters for the requested time period, some results have not been displayed. A complete set of results can be found in Results Review.   DG Foot 2 Views Left  Result Date: 01/28/2023 CLINICAL DATA:  Fall.  Pain over small toe. EXAM: LEFT ANKLE - 2 VIEW; LEFT FOOT - 2 VIEW COMPARISON:  None Available. FINDINGS: There is subtle step-off along the distal articular surface of the proximal phalanx of fifth toe. Correlate  clinically for minimally displaced fracture. No other acute fracture or dislocation. No aggressive osseous lesion. Ankle mortise appears intact. There are mild degenerative changes of imaged joints. Calcaneal spur noted along the Achilles tendon and Plantar aponeurosis attachment sites. No focal soft tissue swelling. No radiopaque foreign bodies. IMPRESSION: 1. Subtle step-off along the distal articular surface of the proximal phalanx of fifth toe. Correlate clinically for minimally displaced fracture. Electronically Signed   By: Jules Schick M.D.   On: 01/28/2023 10:16   DG Ankle 2 Views Left  Result Date: 01/28/2023 CLINICAL DATA:  Fall.  Pain over small toe. EXAM: LEFT ANKLE - 2 VIEW; LEFT FOOT - 2 VIEW COMPARISON:  None Available. FINDINGS: There is subtle step-off along the distal articular surface of the proximal phalanx of fifth toe. Correlate clinically for minimally displaced fracture. No other acute fracture or dislocation. No aggressive osseous lesion. Ankle mortise appears intact. There are mild degenerative changes of imaged joints. Calcaneal spur noted along the Achilles tendon and Plantar aponeurosis attachment sites. No focal soft tissue swelling. No radiopaque foreign bodies. IMPRESSION: 1. Subtle step-off along the distal articular surface of the proximal phalanx of fifth toe. Correlate clinically for minimally displaced fracture. Electronically Signed   By: Jules Schick M.D.   On: 01/28/2023 10:16   DG Chest 2 View  Result Date: 01/28/2023 CLINICAL DATA:  Shortness of breath EXAM: CHEST - 2 VIEW COMPARISON:  X-ray 01/15/2023 FINDINGS: Enlarged cardiopericardial silhouette. Prominence of the central vasculature. Bronchovascular crowding. No pneumothorax or effusion film is under penetrated. Overlapping cardiac leads. Right shoulder reverse arthroplasty identified. IMPRESSION: Enlarged heart. Prominence of the central vasculature but the lungs are underinflated. Under penetrated  radiograph. Electronically Signed   By: Karen Kays M.D.   On: 01/28/2023 09:49    Pending Labs Unresulted Labs (From admission, onward)    None       Vitals/Pain Today's Vitals   01/28/23 0900 01/28/23 0916 01/28/23 1030 01/28/23 1200  BP: (!) 155/95  114/77 117/69  Pulse: 86  67 87  Resp: (!) 28  (!) 27 (!) 23  Temp:  TempSrc:      SpO2: 100%  100% 99%  Weight:  196 lb (88.9 kg)    Height:  5\' 2"  (1.575 m)    PainSc:  9       Isolation Precautions No active isolations  Medications Medications  furosemide (LASIX) injection 40 mg (40 mg Intravenous Given 01/28/23 1410)    Mobility walks

## 2023-01-28 NOTE — ED Triage Notes (Addendum)
Pt coming in today from the cancer center for SOB. Pt states she feels like she is unable to breath, began last night, pt did use an inhaler without relief. Pt also states that she fell out of the car exiting the car today, denies hitting her head, but did injure her left foot. Pt recently seen for flash pulmonary edema.

## 2023-01-29 ENCOUNTER — Ambulatory Visit (INDEPENDENT_AMBULATORY_CARE_PROVIDER_SITE_OTHER): Payer: Medicare Other

## 2023-01-29 ENCOUNTER — Observation Stay (HOSPITAL_COMMUNITY): Payer: Medicare Other

## 2023-01-29 DIAGNOSIS — Z794 Long term (current) use of insulin: Secondary | ICD-10-CM | POA: Diagnosis not present

## 2023-01-29 DIAGNOSIS — E875 Hyperkalemia: Secondary | ICD-10-CM | POA: Diagnosis present

## 2023-01-29 DIAGNOSIS — E669 Obesity, unspecified: Secondary | ICD-10-CM | POA: Diagnosis present

## 2023-01-29 DIAGNOSIS — S92515A Nondisplaced fracture of proximal phalanx of left lesser toe(s), initial encounter for closed fracture: Secondary | ICD-10-CM | POA: Diagnosis present

## 2023-01-29 DIAGNOSIS — R0602 Shortness of breath: Secondary | ICD-10-CM | POA: Diagnosis present

## 2023-01-29 DIAGNOSIS — J9621 Acute and chronic respiratory failure with hypoxia: Secondary | ICD-10-CM

## 2023-01-29 DIAGNOSIS — N179 Acute kidney failure, unspecified: Secondary | ICD-10-CM | POA: Diagnosis not present

## 2023-01-29 DIAGNOSIS — E1165 Type 2 diabetes mellitus with hyperglycemia: Secondary | ICD-10-CM | POA: Diagnosis present

## 2023-01-29 DIAGNOSIS — M79662 Pain in left lower leg: Secondary | ICD-10-CM | POA: Diagnosis not present

## 2023-01-29 DIAGNOSIS — G894 Chronic pain syndrome: Secondary | ICD-10-CM | POA: Diagnosis present

## 2023-01-29 DIAGNOSIS — X501XXA Overexertion from prolonged static or awkward postures, initial encounter: Secondary | ICD-10-CM | POA: Diagnosis not present

## 2023-01-29 DIAGNOSIS — G4733 Obstructive sleep apnea (adult) (pediatric): Secondary | ICD-10-CM | POA: Diagnosis present

## 2023-01-29 DIAGNOSIS — Z1152 Encounter for screening for COVID-19: Secondary | ICD-10-CM | POA: Diagnosis not present

## 2023-01-29 DIAGNOSIS — I5031 Acute diastolic (congestive) heart failure: Secondary | ICD-10-CM | POA: Diagnosis not present

## 2023-01-29 DIAGNOSIS — J9611 Chronic respiratory failure with hypoxia: Secondary | ICD-10-CM | POA: Diagnosis present

## 2023-01-29 DIAGNOSIS — Z7901 Long term (current) use of anticoagulants: Secondary | ICD-10-CM | POA: Diagnosis not present

## 2023-01-29 DIAGNOSIS — M1712 Unilateral primary osteoarthritis, left knee: Secondary | ICD-10-CM | POA: Diagnosis not present

## 2023-01-29 DIAGNOSIS — G8929 Other chronic pain: Secondary | ICD-10-CM | POA: Diagnosis not present

## 2023-01-29 DIAGNOSIS — I110 Hypertensive heart disease with heart failure: Secondary | ICD-10-CM

## 2023-01-29 DIAGNOSIS — S90122A Contusion of left lesser toe(s) without damage to nail, initial encounter: Secondary | ICD-10-CM | POA: Diagnosis present

## 2023-01-29 DIAGNOSIS — I700 Atherosclerosis of aorta: Secondary | ICD-10-CM

## 2023-01-29 DIAGNOSIS — I2510 Atherosclerotic heart disease of native coronary artery without angina pectoris: Secondary | ICD-10-CM

## 2023-01-29 DIAGNOSIS — E1140 Type 2 diabetes mellitus with diabetic neuropathy, unspecified: Secondary | ICD-10-CM | POA: Diagnosis present

## 2023-01-29 DIAGNOSIS — E785 Hyperlipidemia, unspecified: Secondary | ICD-10-CM | POA: Diagnosis present

## 2023-01-29 DIAGNOSIS — I5033 Acute on chronic diastolic (congestive) heart failure: Secondary | ICD-10-CM

## 2023-01-29 DIAGNOSIS — Z66 Do not resuscitate: Secondary | ICD-10-CM | POA: Diagnosis present

## 2023-01-29 DIAGNOSIS — F319 Bipolar disorder, unspecified: Secondary | ICD-10-CM | POA: Diagnosis present

## 2023-01-29 DIAGNOSIS — I4821 Permanent atrial fibrillation: Secondary | ICD-10-CM | POA: Diagnosis present

## 2023-01-29 DIAGNOSIS — E119 Type 2 diabetes mellitus without complications: Secondary | ICD-10-CM | POA: Diagnosis not present

## 2023-01-29 DIAGNOSIS — F411 Generalized anxiety disorder: Secondary | ICD-10-CM | POA: Diagnosis present

## 2023-01-29 DIAGNOSIS — M439 Deforming dorsopathy, unspecified: Secondary | ICD-10-CM

## 2023-01-29 DIAGNOSIS — C541 Malignant neoplasm of endometrium: Secondary | ICD-10-CM | POA: Diagnosis present

## 2023-01-29 DIAGNOSIS — Z9981 Dependence on supplemental oxygen: Secondary | ICD-10-CM | POA: Diagnosis not present

## 2023-01-29 DIAGNOSIS — M25562 Pain in left knee: Secondary | ICD-10-CM | POA: Diagnosis not present

## 2023-01-29 DIAGNOSIS — S90512A Abrasion, left ankle, initial encounter: Secondary | ICD-10-CM | POA: Diagnosis present

## 2023-01-29 DIAGNOSIS — S92505A Nondisplaced unspecified fracture of left lesser toe(s), initial encounter for closed fracture: Secondary | ICD-10-CM | POA: Diagnosis not present

## 2023-01-29 DIAGNOSIS — D72829 Elevated white blood cell count, unspecified: Secondary | ICD-10-CM | POA: Diagnosis present

## 2023-01-29 DIAGNOSIS — I130 Hypertensive heart and chronic kidney disease with heart failure and stage 1 through stage 4 chronic kidney disease, or unspecified chronic kidney disease: Secondary | ICD-10-CM | POA: Diagnosis present

## 2023-01-29 DIAGNOSIS — G2581 Restless legs syndrome: Secondary | ICD-10-CM | POA: Diagnosis present

## 2023-01-29 LAB — BASIC METABOLIC PANEL
Anion gap: 7 (ref 5–15)
BUN: 17 mg/dL (ref 8–23)
CO2: 29 mmol/L (ref 22–32)
Calcium: 8.5 mg/dL — ABNORMAL LOW (ref 8.9–10.3)
Chloride: 99 mmol/L (ref 98–111)
Creatinine, Ser: 0.91 mg/dL (ref 0.44–1.00)
GFR, Estimated: >60 mL/min (ref >60)
Glucose, Bld: 233 mg/dL — ABNORMAL HIGH (ref 70–99)
Potassium: 4.3 mmol/L (ref 3.5–5.1)
Sodium: 135 mmol/L (ref 135–145)

## 2023-01-29 LAB — GLUCOSE, CAPILLARY
Glucose-Capillary: 251 mg/dL — ABNORMAL HIGH (ref 70–99)
Glucose-Capillary: 281 mg/dL — ABNORMAL HIGH (ref 70–99)
Glucose-Capillary: 220 mg/dL — ABNORMAL HIGH (ref 70–99)
Glucose-Capillary: 208 mg/dL — ABNORMAL HIGH (ref 70–99)

## 2023-01-29 MED ORDER — TRAZODONE HCL 50 MG PO TABS
50.0000 mg | ORAL_TABLET | Freq: Every evening | ORAL | Status: DC | PRN
  Administered 2023-01-29 (×2): 50 mg via ORAL
  Filled 2023-01-29 (×2): qty 1

## 2023-01-29 MED ORDER — NORETHINDRONE ACETATE 5 MG PO TABS
10.0000 mg | ORAL_TABLET | Freq: Every day | ORAL | Status: DC
  Administered 2023-01-29 – 2023-01-30 (×2): 10 mg via ORAL
  Filled 2023-01-29 (×2): qty 2

## 2023-01-29 MED ORDER — FLUTICASONE PROPIONATE 50 MCG/ACT NA SUSP
1.0000 | Freq: Every day | NASAL | Status: DC
  Administered 2023-01-29 – 2023-01-30 (×2): 1 via NASAL
  Filled 2023-01-29: qty 16

## 2023-01-29 MED ORDER — SPIRONOLACTONE 12.5 MG HALF TABLET
12.5000 mg | ORAL_TABLET | Freq: Every day | ORAL | Status: DC
  Administered 2023-01-29 – 2023-01-30 (×2): 12.5 mg via ORAL
  Filled 2023-01-29 (×2): qty 1

## 2023-01-29 MED ORDER — POLYETHYLENE GLYCOL 3350 17 G PO PACK
17.0000 g | PACK | Freq: Every day | ORAL | Status: DC
  Administered 2023-01-29: 17 g via ORAL
  Filled 2023-01-29 (×2): qty 1

## 2023-01-29 MED ORDER — SENNOSIDES-DOCUSATE SODIUM 8.6-50 MG PO TABS
1.0000 | ORAL_TABLET | Freq: Two times a day (BID) | ORAL | Status: DC
  Administered 2023-01-29: 1 via ORAL
  Filled 2023-01-29 (×2): qty 1

## 2023-01-29 MED ORDER — FUROSEMIDE 10 MG/ML IJ SOLN
40.0000 mg | Freq: Two times a day (BID) | INTRAMUSCULAR | Status: DC
  Administered 2023-01-29 – 2023-01-30 (×2): 40 mg via INTRAVENOUS
  Filled 2023-01-29 (×2): qty 4

## 2023-01-29 MED ORDER — ATORVASTATIN CALCIUM 40 MG PO TABS
40.0000 mg | ORAL_TABLET | Freq: Every day | ORAL | Status: DC
  Administered 2023-01-29 – 2023-01-30 (×2): 40 mg via ORAL
  Filled 2023-01-29 (×2): qty 1

## 2023-01-29 MED ORDER — FUROSEMIDE 10 MG/ML IJ SOLN
40.0000 mg | Freq: Once | INTRAMUSCULAR | Status: AC
  Administered 2023-01-29: 40 mg via INTRAVENOUS
  Filled 2023-01-29: qty 4

## 2023-01-29 NOTE — Plan of Care (Signed)
  Problem: Activity: Goal: Capacity to carry out activities will improve Outcome: Progressing   Problem: Education: Goal: Knowledge of General Education information will improve Description: Including pain rating scale, medication(s)/side effects and non-pharmacologic comfort measures Outcome: Progressing   Problem: Clinical Measurements: Goal: Respiratory complications will improve Outcome: Progressing   Problem: Activity: Goal: Risk for activity intolerance will decrease Outcome: Progressing   Problem: Pain Managment: Goal: General experience of comfort will improve Outcome: Progressing   Problem: Safety: Goal: Ability to remain free from injury will improve Outcome: Progressing

## 2023-01-29 NOTE — Hospital Course (Addendum)
Amber Stephenson is a 77 y.o. female with a history of chronic diastolic heart failure, chronic atrial fibrillation on Eliquis, chronic respiratory failure with hypoxia on 2 L/min of oxygen, insulin-dependent type 2 diabetes mellitus, primary hypertension, hyperlipidemia, bipolar 1 disorder, endometrial cancer.  Patient presented secondary to shortness of breath with concern for acute heart failure.  Cardiology consulted. Lasix diuresis started. Patient's respiratory status improved to baseline prior to discharge. Patient was also found to have a left foot fracture. Post-op shoe and outpatient orthopedic surgery follow-up recommended.

## 2023-01-29 NOTE — TOC Initial Note (Addendum)
Transition of Care [facility]) - Initial/Assessment Note    Patient Details  Name: Amber Stephenson MRN: [id number] Date of Birth: 06/09/1945  [facility] CM/SW Contact:    Howell Rucks, RN Phone Number: 01/29/2023, 3:31 PM  Clinical Narrative:  Met with pt and spouse at bedside to introduce role of TOC/NCM and review for dc planning. Pt reports she has a PCP and pharmacy in place, pt reports she is currently receiving HH services through [place] [facility], [place] will confirm services  being received. Pt reports she has home 02-could not recall 02 provider. Pt reports no other home DME. Spouse to provide transport at dc. PT eval pending. MOON completed. TOC will continue to follow.   -3:39pm [employer] rep-Cory,  confirmed pt active on service with [facility] PT/OT.                   Expected Discharge Plan: Home w Home Health Services Barriers to Discharge: Continued Medical Work up   Patient Goals and CMS Choice Patient states their goals for this hospitalization and ongoing recovery are:: return home with [facility] PT/[facility]          Expected Discharge Plan and Services       Living arrangements for the past 2 months: Single Family Home                                      Prior Living Arrangements/Services Living arrangements for the past 2 months: Single Family Home Lives with:: Spouse Patient language and need for interpreter reviewed:: Yes Do you feel safe going back to the place where you live?: Yes      Need for Family Participation in Patient Care: Yes (Comment) Care giver support system in place?: Yes (comment) Current home services: DME (home 02) Criminal Activity/Legal Involvement Pertinent to Current Situation/Hospitalization: No - Comment as needed  Activities of Daily Living Home Assistive Devices/Equipment: CBG Meter, Blood pressure cuff, Oxygen, Scales, Grab bars around toilet, Grab bars in shower, Nebulizer, Built-in shower seat, Wheelchair, Environmental consultant (specify type) ADL  Screening (condition at time of admission) Patient's cognitive ability adequate to safely complete daily activities?: Yes Is the patient deaf or have difficulty hearing?: No Does the patient have difficulty seeing, even when wearing glasses/contacts?: No Does the patient have difficulty concentrating, remembering, or making decisions?: No Patient able to express need for assistance with ADLs?: Yes Does the patient have difficulty dressing or bathing?: No Independently performs ADLs?: Yes (appropriate for developmental age) Does the patient have difficulty walking or climbing stairs?: Yes Weakness of Legs: Left Weakness of Arms/Hands: Left  Permission Sought/Granted Permission sought to share information with : Case Manager Permission granted to share information with : Yes, Verbal Permission Granted  Share Information with NAME: Fannie Knee, RN           Emotional Assessment Appearance:: Appears stated age Attitude/Demeanor/Rapport: Gracious Affect (typically observed): Accepting Orientation: : Oriented to Self, Oriented to Place, Oriented to  Time Alcohol / Substance Use: Not Applicable Psych Involvement: No (comment)  Admission diagnosis:  Acute heart failure (HCC) [I50.9] Closed nondisplaced fracture of proximal phalanx of lesser toe of left foot, initial encounter [S92.515A] Longstanding persistent atrial fibrillation (HCC) [I48.11] Acute on chronic congestive heart failure, unspecified heart failure type [facility]) [I50.9] Patient Active Problem List   Diagnosis Date Noted   Acute heart failure (HCC) 01/28/2023   Pure hypercholesterolemia 01/14/2023  Primary hypertension 01/14/2023   Metabolic acidosis 01/12/2023   Endometrial cancer (HCC) 01/12/2023   Acute on chronic respiratory failure with hypoxia (HCC) 01/12/2023   Vulvar itching 12/22/2022   PMB (postmenopausal bleeding) 11/13/2022   Superficial fungus infection of skin 11/13/2022   Burning with urination  11/13/2022   UTI (urinary tract infection) 11/09/2022   Vitamin B12 deficiency 11/09/2022   Scalp hematoma, initial encounter 11/09/2022   Frequent falls 09/01/2022   Hypotension 09/01/2022   Renal insufficiency 08/08/2022   AKI (acute kidney injury) (HCC) 06/27/2022   Hemorrhoids 03/11/2022   Rectal itching 03/11/2022   Microalbuminuria 01/21/2022   Tremor, essential 12/11/2021   Chronic right-sided low back pain with right-sided sciatica 07/19/2021   Vaginal itching 04/16/2021   Sensory ataxia 03/28/2021   Acute diastolic CHF (congestive heart failure) (HCC) 12/16/2020   Elevated troponin 12/14/2020   Chronic post-traumatic stress disorder (PTSD) 12/06/2020   Senile nuclear sclerosis 12/06/2020   Senile corneal changes 12/06/2020   Postmenopausal bleeding 12/06/2020   Acquired hammer toe 12/06/2020   Opioid dependence 12/06/2020   Hypermetropia 12/06/2020   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   Generalized anxiety disorder 12/06/2020   Functional diarrhea 10/26/2020   Insulin dependent type 2 diabetes mellitus (HCC) 09/24/2020   Chronic respiratory failure with hypoxia (HCC) 04/25/2020   Chronic constipation 04/25/2020   CAD (coronary artery disease)    Hypocalcemia    S/P shoulder replacement, 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   Mixed hyperlipidemia 05/20/2019   Vertigo 04/25/2019   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   OSA (obstructive sleep apnea) 02/24/2019   Acute on chronic hypoxic respiratory failure (HCC) 01/22/2019   Acute pulmonary edema (HCC) 01/21/2019   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   At risk for adverse drug event 07/06/2018   Non-alcoholic fatty liver disease 01/12/2018   Mild intermittent asthma 01/12/2018   Senile osteoporosis 12/15/2017   Atrial fibrillation with rapid ventricular response (HCC) 05/19/2017   Migraine headache with aura 02/12/2016   Diabetic neuropathy (HCC) 02/06/2016   Bipolar 1 disorder (HCC) 10/04/2015   Major depressive  disorder 10/03/2015   Herpes genitalis in women 07/16/2015   Long term current use of anticoagulant 05/30/2015   Chronic diastolic heart failure (HCC) 05/30/2015   Essential hypertension    RLS (restless legs syndrome) 04/27/2015   Insomnia 01/23/2015   Chronic atrial fibrillation with RVR (HCC) 01/04/2015   PCP:  Mechele Claude, MD Pharmacy:   [facility] [phone] - [place], [place] - [address] AT [facility] [address] [place] [address] Phone: [phone] Fax: [phone]     Social Determinants of Health (SDOH) Social History: SDOH Screenings   Food Insecurity: No Food Insecurity (01/28/2023)  Housing: Patient Declined (01/28/2023)  Transportation Needs: No Transportation Needs (01/28/2023)  Utilities: Not At Risk (01/28/2023)  Alcohol Screen: Low Risk  (01/23/2023)  Depression (PHQ2-9): Low Risk  (01/23/2023)  Financial Resource Strain: Low Risk  (01/23/2023)  Physical Activity: Inactive (01/23/2023)  Social Connections: Moderately Integrated (01/23/2023)  Stress: No Stress Concern Present (01/23/2023)  Tobacco Use: Low Risk  (01/28/2023)  Health Literacy: Adequate Health Literacy (01/23/2023)   SDOH Interventions:     Readmission Risk Interventions    01/13/2023   10:06 AM 11/11/2022    4:01 PM 06/29/2022   10:48 AM  Readmission Risk Prevention Plan  Transportation Screening Complete Complete Complete  HRI or Home Care Consult   Complete  Social Work Consult for Recovery Care Planning/Counseling   Complete  Palliative Care Screening  Not Applicable  Medication Review (RN Care Manager)  Complete Complete  HRI or Home Care Consult  Complete   SW Recovery Care/Counseling Consult  Complete   Palliative Care Screening  Not Applicable   Skilled Nursing Facility  Patient Refused

## 2023-01-29 NOTE — Progress Notes (Signed)
PROGRESS NOTE    Amber Stephenson  OZH:[id number] DOB: 10-15-1945 DOA: 01/28/2023 PCP: Mechele Claude, MD   Brief Narrative: Amber Stephenson is a 77 y.o. female with a history of chronic diastolic heart failure, chronic atrial fibrillation on Eliquis, chronic respiratory failure with hypoxia on 2 L/min of oxygen, insulin-dependent type 2 diabetes mellitus, primary hypertension, hyperlipidemia, bipolar 1 disorder, endometrial cancer.  Patient presented secondary to shortness of breath with concern for acute heart failure.  Cardiology consulted. Lasix diuresis started.   Assessment and Plan:  Chronic respiratory failure with hypoxia Hypoxia Patient recently weaned to room air from prior admission. Patient placed on 4 L/min of [place] this admission. -Wean to 2 L/min -Ambulatory pulse ox -Continuous pulse ox   Acute on chronic diastolic heart failure Unclear trigger. Recent echocardiogram was significant for an LVEF of 60 to 65% with indeterminant diastolic function.  Cardiology consulted and patient was started on Lasix IV diuresis. Last discharge weight of 86.1 kg. Weight documented as 88.9 kg on admission. Weight up to 90 kg this morning. -Strict in/out -Daily weights -Cardiology recommendations: Lasix IV   Hyperkalemia Mild. Resolved with Lasix.   Permanent atrial fibrillation Patient is managed on metoprolol 50 mg twice daily and Eliquis as an outpatient. -Continue metoprolol and Eliquis   Foot fracture Fracture involves the proximal phalanx of fifth toe. Post-op shoe ordered with recommendation for orthopedic surgery follow-up. Discussed with Dr. Eulah Pont.   Dependent diabetes mellitus type 2 Uncontrolled with hemoglobin A1c of 11.3%.  Patient is on insulin glargine 35 units twice daily in addition to insulin lispro 3 times daily via sliding scale, metformin. -insulin glargine 30 units BID and SSI   Chronic pain Continue MS Contin 15 mg twice daily   Hyponatremia Appears  to be a component of pseudohyponatremia secondary to hyperglycemia.  Improved.  Corrected sodium of 137.   Nonobstructive CAD Noted.  Patient with mildly elevated troponin, unlikely ACS. Negative delta.   Hyperlipidemia Continue Lipitor.   Anxiety Continue home Ativan.   Endometrial cancer Patient follows with Dr. [name], gynecology oncology -Continue norethindrone   Obesity Estimated body mass index is 36.29 kg/m as calculated from the following:   Height as of this encounter: 5\' 2"  (1.575 m).   Weight as of this encounter: 90 kg.  DVT prophylaxis: Eliquis Code Status:   Code Status: Full Code Family Communication: Husband at bedside Disposition Plan: Discharge home pending continued Cardiology recommendations   Consultants:  Cardiology  Procedures:  None  Antimicrobials: None    Subjective: Patient with dyspnea overnight. Feeling better with a breathing treatment.  Objective: BP 119/73 (BP Location: Right Arm)   Pulse 76   Temp 97.7 F (36.5 C) (Oral)   Resp (!) 22   Ht 5\' 2"  (1.575 m)   Wt 90 kg   SpO2 99%   BMI 36.29 kg/m   Examination:  General exam: Appears calm and comfortable Respiratory system: Right sided rales. Respiratory effort normal. Cardiovascular system: S1 & S2 heard. Gastrointestinal system: Abdomen is nondistended, soft and nontender. Normal bowel sounds heard. Central nervous system: Alert and oriented. No focal neurological deficits. Musculoskeletal: No edema. No calf tenderness Skin: No cyanosis. No rashes Psychiatry: Judgement and insight appear normal. Mood & affect appropriate.    Data Reviewed: I have personally reviewed following labs and imaging studies  CBC Lab Results  Component Value Date   WBC 10.7 (H) 01/28/2023   RBC 4.23 01/28/2023   HGB 11.8 (L) 01/28/2023  HCT 38.3 01/28/2023   MCV 90.5 01/28/2023   MCH 27.9 01/28/2023   PLT 217 01/28/2023   MCHC 30.8 01/28/2023   RDW 15.0 01/28/2023    LYMPHSABS 2.7 12/25/2022   MONOABS 0.6 11/14/2022   EOSABS 0.2 12/25/2022   BASOSABS 0.0 12/25/2022     Last metabolic panel Lab Results  Component Value Date   NA 135 01/29/2023   K 4.3 01/29/2023   CL 99 01/29/2023   CO2 29 01/29/2023   BUN 17 01/29/2023   CREATININE 0.91 01/29/2023   GLUCOSE 233 (H) 01/29/2023   GFRNONAA >60 01/29/2023   GFRAA 71 06/13/2020   CALCIUM 8.5 (L) 01/29/2023   PHOS 3.1 01/13/2023   PROT 6.6 01/13/2023   ALBUMIN 3.3 (L) 01/13/2023   LABGLOB 2.8 11/26/2022   AGRATIO 1.4 09/16/2022   BILITOT 0.5 01/13/2023   ALKPHOS 53 01/13/2023   AST 19 01/13/2023   ALT 16 01/13/2023   ANIONGAP 7 01/29/2023    GFR: Estimated Creatinine Clearance: 55.7 mL/min (by C-G formula based on SCr of 0.91 mg/dL).  Recent Results (from the past 240 hour(s))  Resp panel by RT-PCR (RSV, Flu A&B, Covid) Anterior Nasal Swab     Status: None   Collection Time: 01/28/23 10:12 AM   Specimen: Anterior Nasal Swab  Result Value Ref Range Status   SARS Coronavirus 2 by RT PCR NEGATIVE NEGATIVE Final    Comment: (NOTE) SARS-CoV-2 target nucleic acids are NOT DETECTED.  The SARS-CoV-2 RNA is generally detectable in upper respiratory specimens during the acute phase of infection. The lowest concentration of SARS-CoV-2 viral copies this assay can detect is 138 copies/mL. A negative result does not preclude SARS-Cov-2 infection and should not be used as the sole basis for treatment or other patient management decisions. A negative result may occur with  improper specimen collection/handling, submission of specimen other than nasopharyngeal swab, presence of viral mutation(s) within the areas targeted by this assay, and inadequate number of viral copies(<138 copies/mL). A negative result must be combined with clinical observations, patient history, and epidemiological information. The expected result is Negative.  Fact Sheet for Patients:   BloggerCourse.com  Fact Sheet for Healthcare Providers:  SeriousBroker.it  This test is no t yet approved or cleared by the [place] FDA and  has been authorized for detection and/or diagnosis of SARS-CoV-2 by FDA under an Emergency Use Authorization (EUA). This EUA will remain  in effect (meaning this test can be used) for the duration of the COVID-19 declaration under Section 564(b)(1) of the Act, 21 U.S.C.section 360bbb-3(b)(1), unless the authorization is terminated  or revoked sooner.       Influenza A by PCR NEGATIVE NEGATIVE Final   Influenza B by PCR NEGATIVE NEGATIVE Final    Comment: (NOTE) The Xpert Xpress SARS-CoV-2/FLU/RSV plus assay is intended as an aid in the diagnosis of influenza from Nasopharyngeal swab specimens and should not be used as a sole basis for treatment. Nasal washings and aspirates are unacceptable for Xpert Xpress SARS-CoV-2/FLU/RSV testing.  Fact Sheet for Patients: BloggerCourse.com  Fact Sheet for Healthcare Providers: SeriousBroker.it  This test is not yet approved or cleared by the [place] FDA and has been authorized for detection and/or diagnosis of SARS-CoV-2 by FDA under an Emergency Use Authorization (EUA). This EUA will remain in effect (meaning this test can be used) for the duration of the COVID-19 declaration under Section 564(b)(1) of the Act, 21 U.S.C. section 360bbb-3(b)(1), unless the authorization is terminated or revoked.  Resp Syncytial Virus by PCR NEGATIVE NEGATIVE Final    Comment: (NOTE) Fact Sheet for Patients: BloggerCourse.com  Fact Sheet for Healthcare Providers: SeriousBroker.it  This test is not yet approved or cleared by the [place] FDA and has been authorized for detection and/or diagnosis of SARS-CoV-2 by FDA under an Emergency Use  Authorization (EUA). This EUA will remain in effect (meaning this test can be used) for the duration of the COVID-19 declaration under Section 564(b)(1) of the Act, 21 U.S.C. section 360bbb-3(b)(1), unless the authorization is terminated or revoked.  Performed at [facility], [address], [place], [place] [address]       Radiology Studies: DG Foot 2 Views Left  Result Date: 01/28/2023 CLINICAL DATA:  Fall.  Pain over small toe. EXAM: LEFT ANKLE - 2 VIEW; LEFT FOOT - 2 VIEW COMPARISON:  None Available. FINDINGS: There is subtle step-off along the distal articular surface of the proximal phalanx of fifth toe. Correlate clinically for minimally displaced fracture. No other acute fracture or dislocation. No aggressive osseous lesion. Ankle mortise appears intact. There are mild degenerative changes of imaged joints. Calcaneal spur noted along the Achilles tendon and Plantar aponeurosis attachment sites. No focal soft tissue swelling. No radiopaque foreign bodies. IMPRESSION: 1. Subtle step-off along the distal articular surface of the proximal phalanx of fifth toe. Correlate clinically for minimally displaced fracture. Electronically Signed   By: Jules Schick M.D.   On: 01/28/2023 10:16   DG Ankle 2 Views Left  Result Date: 01/28/2023 CLINICAL DATA:  Fall.  Pain over small toe. EXAM: LEFT ANKLE - 2 VIEW; LEFT FOOT - 2 VIEW COMPARISON:  None Available. FINDINGS: There is subtle step-off along the distal articular surface of the proximal phalanx of fifth toe. Correlate clinically for minimally displaced fracture. No other acute fracture or dislocation. No aggressive osseous lesion. Ankle mortise appears intact. There are mild degenerative changes of imaged joints. Calcaneal spur noted along the Achilles tendon and Plantar aponeurosis attachment sites. No focal soft tissue swelling. No radiopaque foreign bodies. IMPRESSION: 1. Subtle step-off along the distal articular surface of  the proximal phalanx of fifth toe. Correlate clinically for minimally displaced fracture. Electronically Signed   By: Jules Schick M.D.   On: 01/28/2023 10:16   DG Chest 2 View  Result Date: 01/28/2023 CLINICAL DATA:  Shortness of breath EXAM: CHEST - 2 VIEW COMPARISON:  X-ray 01/15/2023 FINDINGS: Enlarged cardiopericardial silhouette. Prominence of the central vasculature. Bronchovascular crowding. No pneumothorax or effusion film is under penetrated. Overlapping cardiac leads. Right shoulder reverse arthroplasty identified. IMPRESSION: Enlarged heart. Prominence of the central vasculature but the lungs are underinflated. Under penetrated radiograph. Electronically Signed   By: Karen Kays M.D.   On: 01/28/2023 09:49      LOS: 0 days    Jacquelin Hawking, MD [facility] 01/29/2023, 2:54 PM   If 7PM-7AM, please contact night-coverage [email or web address]

## 2023-01-29 NOTE — Progress Notes (Signed)
Mobility Specialist - Progress Note   01/29/23 1006  Mobility  Activity Transferred to/from [facility]  Level of Assistance Minimal assist, patient does 75% or more  Assistive Device BSC  Range of Motion/Exercises Active Assistive  Activity Response Tolerated fair  $Mobility charge 1 Mobility   Pt was found in bed wanting to use BSC. Pt assisted to/from [facility]. Declined any ambulation due to pain from L foot. Was left in bed with all needs met. Call bell in reach and husband in room.  Billey Chang Mobility Specialist

## 2023-01-29 NOTE — Consult Note (Signed)
ORTHOPAEDIC CONSULTATION  REQUESTING PHYSICIAN: Narda Bonds, MD  Chief Complaint: Left small toe pain  HPI: Amber Stephenson is a 77 y.o. female who complains of pain in the left small toe after falling when getting into her car yesterday. She felt like her legs gave out on her. She has had pain and trouble weight bearing on the left foot since then.  Imaging shows a subtle step-off along the distal articular surface of the proximal phalanx of fifth toe. Correlate clinically for minimally displaced fracture.    Orthopedics was consulted for evaluation.     Previously ambulatory without the use of assistive devices.  The patient is living at home with her husband.    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   Benign paroxysmal positional vertigo due to bilateral vestibular disorder 05/20/2019   Bipolar 1 disorder (HCC) 01/23/2015   with GAD, benzo dependence   BPPV (benign paroxysmal positional vertigo) 05/20/2019   CAD (coronary artery disease)    Nonobstructive; Managed by Dr. Purvis Sheffield   Cardiomegaly 01/12/2018   CHF (congestive heart failure) 06/08/2022   Chronic back pain 01/04/2015   Chronic constipation 04/25/2020   Chronic diastolic heart failure (HCC) 05/30/2015   Chronic pain syndrome 08/22/2019   back pain, sacroiliitis   Chronic pain syndrome 08/22/2019   Chronic post-traumatic stress disorder (PTSD) 12/06/2020   Diabetic neuropathy (HCC) 02/06/2016   Dyslipidemia 09/24/2020   Dyspnea on exertion    Essential hypertension    Family history of coronary arteriosclerosis 05/30/2015   Functional diarrhea  10/26/2020   Head trauma 09/17/2020   Hemorrhoids 03/11/2022   Herpes genitalis in women 07/16/2015   History of adenomatous polyp of colon 05/21/2019   Overview:   03/31/17: Colonoscopy: nonadvanced adenoma, microscopic colitis, f/u 5 yrs, [facility]/GAP   Insomnia 01/23/2015   Lipoma 02/08/2015   Migraine headache with aura 02/12/2016   Myofascial pain dysfunction syndrome 08/22/2019   Myofascial pain dysfunction syndrome 08/22/2019   Non-alcoholic fatty liver disease 01/12/2018   Opioid dependence (HCC) 12/06/2020   OSA (obstructive sleep apnea) 02/24/2019   10/09/2018 - HST  - AHI 40.6    Osteopenia 12/06/2020   Rx alendronate 35 mg.   Pinguecula 12/06/2020   Postcoital bleeding 12/06/2020   Presbyopia 12/06/2020   Pulmonary hypertension    RLS (restless legs syndrome) 04/27/2015   Superficial fungus infection of skin 09/03/2021   Tremor, essential 12/11/2021   Type II diabetes mellitus (HCC) 09/24/2020   Past Surgical History:  Procedure Laterality Date   BREAST REDUCTION SURGERY     EYE SURGERY Right    cateracts   HAMMER TOE SURGERY     LEFT HEART CATH AND CORONARY ANGIOGRAPHY N/A 02/03/2018   Procedure: LEFT HEART CATH AND CORONARY ANGIOGRAPHY;  Surgeon: [name], Peter M, MD;  Location: [facility] INVASIVE CV LAB;  Service: Cardiovascular;  Laterality: N/A;   REVERSE SHOULDER ARTHROPLASTY Right 08/19/2019   Procedure: REVERSE SHOULDER ARTHROPLASTY;  Surgeon: Beverely Low, MD;  Location: [facility] ORS;  Service: Orthopedics;  Laterality: Right;  interscalene block   SHOULDER SURGERY Right    "I BROKE MY SHOUDLER   THIGH SURGERY     "  TO REMOVE A TUMOR "   Social History   Socioeconomic History   Marital status: Married    Spouse name: Dwight   Number of children: 3   Years of education: 15   Highest education level: Associate degree: academic program  Occupational History   Occupation: Retired    Comment: Chief Financial Officer  Tobacco Use   Smoking status: Never   Smokeless tobacco: Never   Vaping Use   Vaping status: Never Used  Substance and Sexual Activity   Alcohol use: Not Currently    Alcohol/week: 0.0 standard drinks of alcohol   Drug use: No   Sexual activity: Yes    Partners: Male    Birth control/protection: Post-menopausal  Other Topics Concern   Not on file  Social History Narrative   Lives at home with husband.    They have 3 children who live away - [place], [place], and [place].   She is from [place] and most of her family lives there.   Her husbands's family live nearby   Right handed.   Social Determinants of Health   Financial Resource Strain: Low Risk  (01/23/2023)   Overall Financial Resource Strain (CARDIA)    Difficulty of Paying Living Expenses: Not hard at all  Food Insecurity: No Food Insecurity (01/28/2023)   Hunger Vital Sign    Worried About Running Out of Food in the Last Year: Never true    Ran Out of Food in the Last Year: Never true  Transportation Needs: No Transportation Needs (01/28/2023)   PRAPARE - Administrator, Civil Service (Medical): No    Lack of Transportation (Non-Medical): No  Physical Activity: Inactive (01/23/2023)   Exercise Vital Sign    Days of Exercise per Week: 0 days    Minutes of Exercise per Session: 0 min  Stress: No Stress Concern Present (01/23/2023)   [employer] of Occupational Health - Occupational Stress Questionnaire    Feeling of Stress : Not at all  Social Connections: Moderately Integrated (01/23/2023)   Social Connection and Isolation Panel [NHANES]    Frequency of Communication with Friends and Family: More than three times a week    Frequency of Social Gatherings with Friends and Family: More than three times a week    Attends Religious Services: More than 4 times per year    Active Member of [employer] or Organizations: No    Attends Engineer, structural: Never    Marital Status: Married   Family History  Problem Relation Age of Onset   Diabetes Mother    Heart  disease Mother    Alzheimer's disease Father    Heart disease Sister        CABG   Diabetes Sister    Alcohol abuse Sister    Stroke Brother    Heart disease Brother    Mental illness Brother    Diabetes Brother    Breast cancer Neg Hx    Ovarian cancer Neg Hx    Colon cancer Neg Hx    Endometrial cancer Neg Hx    Allergies  Allergen Reactions   Iodine Anaphylaxis   Ivp Dye [Iodinated Contrast Media] Anaphylaxis, Swelling and Other (See Comments)    Throat closes    Latex Other (See Comments)    Latex tape pulls skin with it   Tape Other (See Comments)    Pulls off the skin, if latex   Tizanidine Other (See Comments)    Weakness, goofy, bad dreams  Prior to Admission medications   Medication Sig Start Date End Date Taking? Authorizing Provider  albuterol (VENTOLIN HFA) 108 (90 Base) MCG/ACT inhaler Inhale 2 puffs into the lungs every 6 (six) hours as needed for wheezing or shortness of breath. 08/09/20  Yes Sharee Holster, NP  allopurinol (ZYLOPRIM) 100 MG tablet Take 100 mg by mouth daily. 10/22/22  Yes [provider]  apixaban (ELIQUIS) 5 MG TABS tablet Take 1 tablet (5 mg total) by mouth 2 (two) times daily. 09/09/22  Yes Turner, Cornelious Bryant, MD  ARIPiprazole (ABILIFY) 2 MG tablet Take 2 mg by mouth at bedtime. 10/07/21  Yes [provider]  atorvastatin (LIPITOR) 40 MG tablet Take 1 tablet (40 mg total) by mouth daily. 01/19/23 04/19/23 Yes Narda Bonds, MD  BELSOMRA 20 MG TABS Take 20 mg by mouth at bedtime as needed (for sleep).   Yes [provider]  Continuous Blood Gluc Sensor (DEXCOM G7 SENSOR) MISC Change sensor every 10 days Patient taking differently: Inject 1 Device into the skin See admin instructions. Place 1 new sensor into the skin every 10 days 05/20/22  Yes Shamleffer, Konrad Dolores, MD  cyanocobalamin (VITAMIN B12) 1000 MCG tablet Take 1 tablet (1,000 mcg total) by mouth daily. 09/04/22  Yes Ghimire, Werner Lean, MD  denosumab (PROLIA)  60 MG/ML SOSY injection Inject 60 mg into the skin every 6 (six) months. 08/14/20  Yes Stacks, Broadus John, MD  doxepin (SINEQUAN) 10 MG capsule Take 10 mg by mouth at bedtime. 11/27/22  Yes [provider]  escitalopram (LEXAPRO) 10 MG tablet Take 10 mg by mouth daily.   Yes [provider]  fluticasone-salmeterol (ADVAIR DISKUS) 100-50 MCG/ACT AEPB Inhale 1 puff into the lungs 2 (two) times daily. Patient taking differently: Inhale 1 puff into the lungs 2 (two) times daily as needed (for flares). 09/11/22  Yes Parrett, Tammy S, NP  furosemide (LASIX) 20 MG tablet Take 20 mg by mouth See admin instructions. Take 20 mg by mouth at 8 AM and 6 PM   Yes [provider]  Galcanezumab-gnlm (EMGALITY) 120 MG/ML SOAJ INJECT CONTENTS ON 1 PEN(120MG ) INTO THE SKIN ONCE MONTHLY Patient taking differently: Inject 120 mg into the skin every 30 (thirty) days. 01/23/23  Yes Marcos Eke, PA-C  insulin glargine (LANTUS SOLOSTAR) 100 UNIT/ML Solostar Pen Inject 35 Units into the skin 2 (two) times daily. 12/25/22  Yes Stacks, Broadus John, MD  insulin lispro (HUMALOG KWIKPEN) 100 UNIT/ML KwikPen Max daily 50 units Patient taking differently: Inject 4 Units into the skin See admin instructions. Inject 4 units into the skin 2 times a day with meals, per sliding scale. Add 1 additional unit for every 30 points above a BGL reading of 160. 05/28/22  Yes Shamleffer, Konrad Dolores, MD  lisinopril (ZESTRIL) 10 MG tablet Take 10 mg by mouth daily.   Yes [provider]  LORazepam (ATIVAN) 0.5 MG tablet Take 0.5-1 mg by mouth See admin instructions. Take 0.5 mg by mouth in the morning and 1 mg at bedtime   Yes [provider]  losartan (COZAAR) 25 MG tablet Take 12.5 mg by mouth daily.   Yes [provider]  metFORMIN (GLUCOPHAGE-XR) 500 MG 24 hr tablet Take 2 tablets (1,000 mg total) by mouth daily with breakfast. 12/08/22  Yes Shamleffer, Konrad Dolores, MD  metoprolol tartrate  (LOPRESSOR) 50 MG tablet Take 1 tablet (50 mg total) by mouth 2 (two) times daily. 01/06/23  Yes Quintella Reichert, MD  morphine (  MS CONTIN) 15 MG 12 hr tablet Take 1 tablet (15 mg total) by mouth every 12 (twelve) hours. For chronic pain- can refill by 01/13/23 12/22/22  Yes Lovorn, Aundra Millet, MD  naloxone [facility]) nasal spray 4 mg/0.1 mL Place 1 spray into the nose once as needed (AS DIRECTED). 08/08/21  Yes [provider]  norethindrone (AYGESTIN) 5 MG tablet Take 2 tablets (10 mg total) by mouth daily. 12/30/22 01/29/23 Yes [name], DO  nystatin (MYCOSTATIN/NYSTOP) powder Apply 1 Application topically as needed (rash). 06/16/22  Yes Shamleffer, Konrad Dolores, MD  nystatin ointment (MYCOSTATIN) Apply 1 Application topically 2 (two) times daily. Patient taking differently: Apply 1 Application topically 2 (two) times daily as needed (for rashes). 12/27/21  Yes Adline Potter, NP  ondansetron (ZOFRAN-ODT) 8 MG disintegrating tablet Take 1 tablet (8 mg total) by mouth every 6 (six) hours as needed for nausea or vomiting. 01/13/22  Yes Mechele Claude, MD  OXcarbazepine (TRILEPTAL) 150 MG tablet Take 1 tablet (150 mg total) by mouth 2 (two) times daily. 06/13/22  Yes Sheikh, Omair Latif, DO  pantoprazole (PROTONIX) 40 MG tablet Take 40 mg by mouth daily as needed (for reflux).   Yes [provider]  pregabalin (LYRICA) 200 MG capsule Take 200 mg by mouth 2 (two) times daily.   Yes [provider]  senna-docusate (SENOKOT-S) 8.6-50 MG tablet Take 1 tablet by mouth at bedtime. Patient taking differently: Take 1 tablet by mouth at bedtime as needed (for constipation). 06/13/22  Yes Sheikh, Omair Latif, DO  tirzepatide [facility]) 5 MG/0.5ML Pen Inject 5 mg into the skin once a week. Patient taking differently: Inject 5 mg into the skin every Sunday. 11/11/22  Yes Shamleffer, Konrad Dolores, MD  TYLENOL 500 MG tablet Take 500-1,000 mg by mouth every 6 (six) hours as needed for mild pain  or headache.   Yes [provider]  Vitamin D, Cholecalciferol, 25 MCG (1000 UT) TABS Take 1,000 Units by mouth daily in the afternoon. 07/26/20  Yes [provider]  diphenhydrAMINE (BENADRYL) 50 MG tablet Take 1 tablet (50 mg total) by mouth once for 1 dose. Take 1 hour prior to CT scan Patient not taking: Reported on 01/28/2023 01/05/23 01/28/23  [name], MD  furosemide (LASIX) 40 MG tablet Take 1 tablet (40 mg total) by mouth 2 (two) times daily. Patient not taking: Reported on 01/28/2023 01/18/23 04/18/23  Narda Bonds, MD  Insulin Pen Needle 29G X MISC 1 Device by Does not apply route daily in the afternoon. 05/28/22   Shamleffer, Konrad Dolores, MD  LORazepam (ATIVAN) 1 MG tablet Take 0.5 tablets (0.5 mg total) by mouth 2 (two) times daily. Patient not taking: Reported on 01/28/2023 09/04/22   Maretta Bees, MD   DG Tibia/Fibula Left  Result Date: 01/29/2023 CLINICAL DATA:  Fall, pain EXAM: LEFT TIBIA AND FIBULA - 2 VIEW COMPARISON:  None Available. FINDINGS: There is no evidence of fracture or other focal bone lesions. Soft tissues are unremarkable. IMPRESSION: No fracture or dislocation of the left tibia or fibula. Electronically Signed   By: Jearld Lesch M.D.   On: 01/29/2023 15:43   DG Knee 3 Views Left  Result Date: 01/29/2023 CLINICAL DATA:  Left knee pain after fall. EXAM: LEFT KNEE - 3 VIEW COMPARISON:  None Available. FINDINGS: No evidence of fracture, dislocation, or joint effusion. Minimal narrowing of medial joint space is noted with osteophyte formation. Soft tissues are unremarkable. IMPRESSION: Minimal degenerative joint disease is noted  medially. No acute abnormality seen. Electronically Signed   By: Lupita Raider M.D.   On: 01/29/2023 15:42   DG Foot 2 Views Left  Result Date: 01/28/2023 CLINICAL DATA:  Fall.  Pain over small toe. EXAM: LEFT ANKLE - 2 VIEW; LEFT FOOT - 2 VIEW COMPARISON:  None Available. FINDINGS: There is subtle step-off  along the distal articular surface of the proximal phalanx of fifth toe. Correlate clinically for minimally displaced fracture. No other acute fracture or dislocation. No aggressive osseous lesion. Ankle mortise appears intact. There are mild degenerative changes of imaged joints. Calcaneal spur noted along the Achilles tendon and Plantar aponeurosis attachment sites. No focal soft tissue swelling. No radiopaque foreign bodies. IMPRESSION: 1. Subtle step-off along the distal articular surface of the proximal phalanx of fifth toe. Correlate clinically for minimally displaced fracture. Electronically Signed   By: Jules Schick M.D.   On: 01/28/2023 10:16   DG Ankle 2 Views Left  Result Date: 01/28/2023 CLINICAL DATA:  Fall.  Pain over small toe. EXAM: LEFT ANKLE - 2 VIEW; LEFT FOOT - 2 VIEW COMPARISON:  None Available. FINDINGS: There is subtle step-off along the distal articular surface of the proximal phalanx of fifth toe. Correlate clinically for minimally displaced fracture. No other acute fracture or dislocation. No aggressive osseous lesion. Ankle mortise appears intact. There are mild degenerative changes of imaged joints. Calcaneal spur noted along the Achilles tendon and Plantar aponeurosis attachment sites. No focal soft tissue swelling. No radiopaque foreign bodies. IMPRESSION: 1. Subtle step-off along the distal articular surface of the proximal phalanx of fifth toe. Correlate clinically for minimally displaced fracture. Electronically Signed   By: Jules Schick M.D.   On: 01/28/2023 10:16   DG Chest 2 View  Result Date: 01/28/2023 CLINICAL DATA:  Shortness of breath EXAM: CHEST - 2 VIEW COMPARISON:  X-ray 01/15/2023 FINDINGS: Enlarged cardiopericardial silhouette. Prominence of the central vasculature. Bronchovascular crowding. No pneumothorax or effusion film is under penetrated. Overlapping cardiac leads. Right shoulder reverse arthroplasty identified. IMPRESSION: Enlarged heart. Prominence of  the central vasculature but the lungs are underinflated. Under penetrated radiograph. Electronically Signed   By: Karen Kays M.D.   On: 01/28/2023 09:49    Positive ROS: All other systems have been reviewed and were otherwise negative with the exception of those mentioned in the HPI and as above.  Objective: Labs cbc Recent Labs    01/28/23 1025  WBC 10.7*  HGB 11.8*  HCT 38.3  PLT 217    Labs inflam No results for input(s): "CRP" in the last 72 hours.  Invalid input(s): "ESR"  Labs coag No results for input(s): "INR", "PTT" in the last 72 hours.  Invalid input(s): "PT"  Recent Labs    01/28/23 1025 01/28/23 1730 01/29/23 0356  NA 132*  --  135  K 5.4* 4.5 4.3  CL 99  --  99  CO2 26  --  29  GLUCOSE 383*  --  233*  BUN 18  --  17  CREATININE 0.88  --  0.91  CALCIUM 9.0  --  8.5*    Physical Exam: Vitals:   01/29/23 0514 01/29/23 1311  BP: 125/64 119/73  Pulse: 70 76  Resp: 18 (!) 22  Temp: 97.7 F (36.5 C) 97.7 F (36.5 C)  SpO2: 99% 99%   General: Alert, no acute distress.  On BSC, calm, [place] in place Mental status: Alert and Oriented x3 Neurologic: Speech Clear and organized, no gross focal findings or  movement disorder appreciated. Respiratory: No cyanosis, no use of accessory musculature Cardiovascular: No pedal edema GI: Abdomen is soft and non-tender, non-distended. Skin: Warm and dry.  Extremities: Warm and well perfused w/o edema Psychiatric: Patient is competent for consent with normal mood and affect  MUSCULOSKELETAL:  TTP left small toe at phalanx region, painful ROM, edema and ecchymosis present, NVI  Other extremities are atraumatic with painless ROM and NVI.  Assessment / Plan: Principal Problem:   Acute heart failure (HCC)    No surgical indication. Can treat non-operatively in post-op shoe. No restrictions. Recommend ice and elevation as needed.    Weightbearing: WBAT LLE Orthopedic device(s):  post-op shoe VTE prophylaxis:   per primary   Pain control: per primary Follow - up plan: 2 weeks Contact information:  Margarita Rana MD, [facility] PA-C    Jenne Pane PA-C Office [phone] 01/29/2023 3:53 PM

## 2023-01-29 NOTE — Evaluation (Signed)
Physical Therapy Evaluation Patient Details Name: Amber Stephenson MRN: [id number] DOB: [birth year] Today's Date: 01/29/2023  History of Present Illness  77 y.o. female with medical history significant of chronic diastolic heart failure, chronic atrial fibrillation on Eliquis, chronic respite failure with hypoxia on 2 L/min of oxygen, insulin-dependent type 2 diabetes, primary hypertension, hyperlipidemia, bipolar 1 disorder, endometrial cancer. Patient reports worsening dyspnea at rest and on exertion, prompting desire for ED evaluation. During attempts to get her to the ED, her legs buckled and she fell; no syncope. Fracture involves the proximal phalanx of fifth toe. Post-op shoe ordered with recommendation for orthopedic surgery follow-up. per hospitalist--Discussed with Dr. Eulah Pont.   recent admission for ARDs and afib  with RVR  Clinical Impression  Pt admitted with above diagnosis.  PT agreeable to mobilizing to EOB, declined standing or amb d/t foot pain therefore deferred ambulatory O2 sats today. Pt currently on 4L (2L at baseline), in NAD with VSS during bed level activity Will benefit from resuming HHPT at d/c given falls and multiple recent admissions.   Pt currently with functional limitations due to the deficits listed below (see PT Problem List). Pt will benefit from acute skilled PT to increase their independence and safety with mobility to allow discharge.           If plan is discharge home, recommend the following: Help with stairs or ramp for entrance;Assistance with cooking/housework;A little help with walking and/or transfers   Can travel by private vehicle        Equipment Recommendations None recommended by PT  Recommendations for Other Services       Functional Status Assessment Patient has had a recent decline in their functional status and demonstrates the ability to make significant improvements in function in a reasonable and predictable amount of time.      Precautions / Restrictions Precautions Precautions: Fall Precaution Comments: chronic 2L O2 Restrictions Weight Bearing Restrictions: No      Mobility  Bed Mobility Overal bed mobility: Needs Assistance Bed Mobility: Supine to Sit, Sit to Supine     Supine to sit: Supervision, Used rails Sit to supine: Supervision, Used rails   General bed mobility comments: supervision for safety and lines; pt was able to scoot up in supine with RLE and bed in trendelenberg    Transfers                   General transfer comment: pt declined d/t pain in her foot    Ambulation/Gait                  Stairs            Wheelchair Mobility     Tilt Bed    Modified Rankin (Stroke Patients Only)       Balance Overall balance assessment: History of Falls, Needs assistance Sitting-balance support: Feet supported, Single extremity supported Sitting balance-Leahy Scale: Fair         Standing balance comment: NT-declined standing                             Pertinent Vitals/Pain Pain Assessment Pain Assessment: Faces Faces Pain Scale: Hurts whole lot Pain Location: L foot d/t toe fx Pain Descriptors / Indicators: Aching, Grimacing Pain Intervention(s): Limited activity within patient's tolerance, Monitored during session, Premedicated before session, Repositioned (LLE elevated)    Home Living Family/patient expects to be discharged to:: Private residence  Available Help at Discharge: Family;Available 24 hours/day Type of Home: House Home Access: Stairs to enter   [employer] of Steps: 3-4   Home Layout: Able to live on main level with bedroom/bathroom Home Equipment: Agricultural consultant (2 wheels);Cane - single point Additional Comments: spouse present and confirmed    Prior Function Prior Level of Function : Needs assist             Mobility Comments: typically pt able to ambulate without assist, history of falls; furniture  surfs per pt husband ADLs Comments: Spouse provides assist as needed with ADL, iADL, community mobility and medication management.     Extremity/Trunk Assessment   Upper Extremity Assessment Upper Extremity Assessment: Generalized weakness    Lower Extremity Assessment Lower Extremity Assessment: Generalized weakness    Cervical / Trunk Assessment Cervical / Trunk Assessment: Normal  Communication      Cognition Arousal: Alert Behavior During Therapy: WFL for tasks assessed/performed Overall Cognitive Status: Within Functional Limits for tasks assessed                                          General Comments      Exercises     Assessment/Plan    PT Assessment Patient needs continued PT services  PT Problem List Decreased strength;Decreased activity tolerance;Decreased mobility       PT Treatment Interventions Gait training;DME instruction;Balance training;Functional mobility training;Therapeutic activities;Therapeutic exercise;Manual techniques    PT Goals (Current goals can be found in the Care Plan section)  Acute Rehab PT Goals Patient Stated Goal: to go home PT Goal Formulation: With patient Time For Goal Achievement: 02/13/23 Potential to Achieve Goals: Fair    Frequency Min 1X/week     Co-evaluation               AM-PAC PT "6 Clicks" Mobility  Outcome Measure Help needed turning from your back to your side while in a flat bed without using bedrails?: A Little Help needed moving from lying on your back to sitting on the side of a flat bed without using bedrails?: A Little Help needed moving to and from a bed to a chair (including a wheelchair)?: A Little Help needed standing up from a chair using your arms (e.g., wheelchair or bedside chair)?: A Little Help needed to walk in hospital room?: A Lot Help needed climbing 3-5 steps with a railing? : A Lot 6 Click Score: 16    End of Session   Activity Tolerance: Patient limited  by fatigue;Patient limited by pain Patient left: in bed;with call bell/phone within reach;with bed alarm set;with family/visitor present   PT Visit Diagnosis: Difficulty in walking, not elsewhere classified (R26.2)    Time: 2130-8657 PT Time Calculation (min) (ACUTE ONLY): 21 min   Charges:   PT Evaluation $PT Eval Low Complexity: 1 Low   PT General Charges $$ ACUTE PT VISIT: 1 Visit         Annie Roseboom, PT  Acute Rehab Dept ([facility]) [phone]  01/29/2023   [facility] 01/29/2023, 4:20 PM

## 2023-01-29 NOTE — Plan of Care (Signed)
  Problem: Cardiac: Goal: Ability to achieve and maintain adequate cardiopulmonary perfusion will improve Outcome: Progressing   Problem: Coping: Goal: Ability to adjust to condition or change in health will improve Outcome: Progressing   Problem: Skin Integrity: Goal: Risk for impaired skin integrity will decrease Outcome: Progressing   Problem: Pain Managment: Goal: General experience of comfort will improve Outcome: Progressing   Problem: Safety: Goal: Ability to remain free from injury will improve Outcome: Progressing

## 2023-01-29 NOTE — Care Management Obs Status (Signed)
MEDICARE OBSERVATION STATUS NOTIFICATION   Patient Details  Name: Amber Stephenson MRN: [id number] Date of Birth: 1945/11/02   Medicare Observation Status Notification Given:       Howell Rucks, RN 01/29/2023, 3:27 PM

## 2023-01-29 NOTE — Consult Note (Addendum)
Cardiology Consultation   Patient ID: Amber Stephenson MRN: [id number]; DOB: 1946/04/01  Admit date: 01/28/2023 Date of Consult: 01/29/2023  PCP:  Mechele Claude, MD   [facility] Providers Cardiologist:  Armanda Magic, MD        Patient Profile:   Amber Stephenson is a 77 y.o. female with a hx of permanent atrial fibrillation on Eliquis, chronic respiratory failure, nonobstructive CAD on cath 2019, hypertension, hyperlipidemia, poorly controlled DM2, CKD stage III, OSA intolerant of CPAP, bipolar disorder and endometrial cancer who is being seen 01/29/2023 for the evaluation of CHF exacerbation at the request of Dr. Caleb Popp.  History of Present Illness:   Amber Stephenson is a 77 year old female with past medical history of permanent atrial fibrillation on Eliquis, chronic respiratory failure, nonobstructive CAD on cath 2019, hypertension, hyperlipidemia, poorly controlled DM2, CKD stage III, OSA intolerant of CPAP, bipolar disorder and endometrial cancer.  She is intolerant of SGLT2 inhibitor as she had recurrent UTI and a yeast infection on Jardiance.  Patient was admitted in [facility] in January 2024 due to acute respiratory failure with hypoxia in the setting of CHF exacerbation.  Echocardiogram at the time showed hyperdynamic EF of 65 to 70%, RV normal, severe LAE, no MR.  She was discharged on Lasix 40 mg twice daily, however by the time she was followed up in the transition of care clinic, she reported she was only taking 20 mg twice daily of Lasix.  She also has a history of frequent falls secondary to polypharmacy and heart failure.  She was seen by Dr. Mayford Knife in April 2024 at which time she was only taking 40 mg Lasix as needed.  She was admitted with hematuria in June 2024.  She was seen in June by OB/GYN for postmenopausal bleeding.  Unfortunately, she was diagnosed with grade 1 endometrial cancer.  Initial plan was to consider total hysterectomy and bilateral  salpingo-oophorectomy and lymph node assessment, however due to poor pulmonary function and recurrent heart failure, she was felt not to be a candidate for surgery.  She was recently admitted on 01/12/2023 due to heart failure and A-fib with RVR.  Heart rate improved on diltiazem drip.  She underwent IV diuresis.  Echocardiogram obtained on 01/14/2023 showed EF 60 to 65%, mild LVH, severe LAE, trivial MR, mild AI, dilated aortic root measuring at 40 mm.  Lasix was eventually increased back up to 40 mg twice a day at the time of discharge.  She did have some chest discomfort at the time of admission, given normal EF and no wall motion abnormality, no plan for inpatient ischemic workup, Dr. Norris Cross notes suggested may consider outpatient Myoview at some point.  Discharge dry weight was 190 pounds.  After returning home, she was initially doing well.  She had appointment yesterday to see radiation oncology service to discuss alternative treatment to surgery for her endometrial cancer.  While on her way to the doctor's office, she had increasing dyspnea with minimal exertion.  She fell and twisted her foot and ankle while trying to get into her car.  She was eventually taken to [facility] for further assessment.  Serial troponin was mildly elevated at 21--> 28.  BNP 355 which is higher than 155 from 2 weeks ago.  Potassium mildly high at 5.4 on arrival.  Sodium 132.  Hemoglobin 11.8.  Respiratory panel negative for COVID or influenza.  Ankle and foot x-ray showed fracture of the left fifth toe.  Chest  x-ray showed enlarged heart, underpenetrated radiograph.  Heart rate on initial arrival was 140s, which settled down to 80s overnight.  Talking with the patient, she says she lives at home with her husband who took her meal.  Her husband also manage her medications well.  She has been compliant with 40 mg twice daily of Lasix and 50 mg twice daily on metoprolol tartrate.  Her husband tried to use low-salt or no  salt in her meal.  She does not watch her fluid intake but feels her fluid intake should not exceed 64 ounces.  She denies any chest pain recently.  She sleeps on 4-5 pillows.  She previously had some paroxysmal nocturnal dyspnea, however none recently.   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   Benign paroxysmal positional vertigo due to bilateral vestibular disorder 05/20/2019   Bipolar 1 disorder (HCC) 01/23/2015   with GAD, benzo dependence   BPPV (benign paroxysmal positional vertigo) 05/20/2019   CAD (coronary artery disease)    Nonobstructive; Managed by Dr. Purvis Sheffield   Cardiomegaly 01/12/2018   CHF (congestive heart failure) 06/08/2022   Chronic back pain 01/04/2015   Chronic constipation 04/25/2020   Chronic diastolic heart failure (HCC) 05/30/2015   Chronic pain syndrome 08/22/2019   back pain, sacroiliitis   Chronic pain syndrome 08/22/2019   Chronic post-traumatic stress disorder (PTSD) 12/06/2020   Diabetic neuropathy (HCC) 02/06/2016   Dyslipidemia 09/24/2020   Dyspnea on exertion    Essential hypertension    Family history of coronary arteriosclerosis 05/30/2015   Functional diarrhea 10/26/2020   Head trauma 09/17/2020   Hemorrhoids 03/11/2022   Herpes genitalis in women 07/16/2015   History of adenomatous polyp of colon 05/21/2019   Overview:   03/31/17: Colonoscopy: nonadvanced adenoma, microscopic colitis, f/u 5 yrs, [facility]/GAP   Insomnia 01/23/2015   Lipoma 02/08/2015   Migraine headache with aura 02/12/2016   Myofascial pain dysfunction syndrome 08/22/2019   Myofascial pain dysfunction syndrome 08/22/2019    Non-alcoholic fatty liver disease 01/12/2018   Opioid dependence (HCC) 12/06/2020   OSA (obstructive sleep apnea) 02/24/2019   10/09/2018 - HST  - AHI 40.6    Osteopenia 12/06/2020   Rx alendronate 35 mg.   Pinguecula 12/06/2020   Postcoital bleeding 12/06/2020   Presbyopia 12/06/2020   Pulmonary hypertension    RLS (restless legs syndrome) 04/27/2015   Superficial fungus infection of skin 09/03/2021   Tremor, essential 12/11/2021   Type II diabetes mellitus (HCC) 09/24/2020    Past Surgical History:  Procedure Laterality Date   BREAST REDUCTION SURGERY     EYE SURGERY Right    cateracts   HAMMER TOE SURGERY     LEFT HEART CATH AND CORONARY ANGIOGRAPHY N/A 02/03/2018   Procedure: LEFT HEART CATH AND CORONARY ANGIOGRAPHY;  Surgeon: [name], Peter M, MD;  Location: [facility] INVASIVE CV LAB;  Service: Cardiovascular;  Laterality: N/A;   REVERSE SHOULDER ARTHROPLASTY Right 08/19/2019   Procedure: REVERSE SHOULDER ARTHROPLASTY;  Surgeon: Beverely Low, MD;  Location: [facility] ORS;  Service: Orthopedics;  Laterality: Right;  interscalene block   SHOULDER SURGERY Right    "I BROKE MY SHOUDLER   THIGH SURGERY     "TO  REMOVE A TUMOR "     Home Medications:  Prior to Admission medications   Medication Sig Start Date End Date Taking? Authorizing Provider  albuterol (VENTOLIN HFA) 108 (90 Base) MCG/ACT inhaler Inhale 2 puffs into the lungs every 6 (six) hours as needed for wheezing or shortness of breath. 08/09/20  Yes Sharee Holster, NP  allopurinol (ZYLOPRIM) 100 MG tablet Take 100 mg by mouth daily. 10/22/22  Yes [provider]  apixaban (ELIQUIS) 5 MG TABS tablet Take 1 tablet (5 mg total) by mouth 2 (two) times daily. 09/09/22  Yes Turner, Cornelious Bryant, MD  ARIPiprazole (ABILIFY) 2 MG tablet Take 2 mg by mouth at bedtime. 10/07/21  Yes [provider]  atorvastatin (LIPITOR) 40 MG tablet Take 1 tablet (40 mg total) by mouth daily. 01/19/23 04/19/23 Yes Narda Bonds, MD  BELSOMRA 20 MG TABS  Take 20 mg by mouth at bedtime as needed (for sleep).   Yes [provider]  Continuous Blood Gluc Sensor (DEXCOM G7 SENSOR) MISC Change sensor every 10 days Patient taking differently: Inject 1 Device into the skin See admin instructions. Place 1 new sensor into the skin every 10 days 05/20/22  Yes Shamleffer, Konrad Dolores, MD  cyanocobalamin (VITAMIN B12) 1000 MCG tablet Take 1 tablet (1,000 mcg total) by mouth daily. 09/04/22  Yes Ghimire, Werner Lean, MD  denosumab (PROLIA) 60 MG/ML SOSY injection Inject 60 mg into the skin every 6 (six) months. 08/14/20  Yes Stacks, Broadus John, MD  doxepin (SINEQUAN) 10 MG capsule Take 10 mg by mouth at bedtime. 11/27/22  Yes [provider]  escitalopram (LEXAPRO) 10 MG tablet Take 10 mg by mouth daily.   Yes [provider]  fluticasone-salmeterol (ADVAIR DISKUS) 100-50 MCG/ACT AEPB Inhale 1 puff into the lungs 2 (two) times daily. Patient taking differently: Inhale 1 puff into the lungs 2 (two) times daily as needed (for flares). 09/11/22  Yes Parrett, Tammy S, NP  furosemide (LASIX) 20 MG tablet Take 20 mg by mouth See admin instructions. Take 20 mg by mouth at 8 AM and 6 PM   Yes [provider]  Galcanezumab-gnlm (EMGALITY) 120 MG/ML SOAJ INJECT CONTENTS ON 1 PEN(120MG ) INTO THE SKIN ONCE MONTHLY Patient taking differently: Inject 120 mg into the skin every 30 (thirty) days. 01/23/23  Yes Marcos Eke, PA-C  insulin glargine (LANTUS SOLOSTAR) 100 UNIT/ML Solostar Pen Inject 35 Units into the skin 2 (two) times daily. 12/25/22  Yes Stacks, Broadus John, MD  insulin lispro (HUMALOG KWIKPEN) 100 UNIT/ML KwikPen Max daily 50 units Patient taking differently: Inject 4 Units into the skin See admin instructions. Inject 4 units into the skin 2 times a day with meals, per sliding scale. Add 1 additional unit for every 30 points above a BGL reading of 160. 05/28/22  Yes Shamleffer, Konrad Dolores, MD  lisinopril (ZESTRIL) 10 MG tablet Take 10  mg by mouth daily.   Yes [provider]  LORazepam (ATIVAN) 0.5 MG tablet Take 0.5-1 mg by mouth See admin instructions. Take 0.5 mg by mouth in the morning and 1 mg at bedtime   Yes [provider]  losartan (COZAAR) 25 MG tablet Take 12.5 mg by mouth daily.   Yes [provider]  metFORMIN (GLUCOPHAGE-XR) 500 MG 24 hr tablet Take 2 tablets (1,000 mg total) by mouth daily with breakfast. 12/08/22  Yes Shamleffer, Konrad Dolores, MD  metoprolol tartrate (LOPRESSOR) 50 MG tablet Take 1 tablet (50 mg total) by mouth 2 (two)  times daily. 01/06/23  Yes Turner, Cornelious Bryant, MD  morphine (MS CONTIN) 15 MG 12 hr tablet Take 1 tablet (15 mg total) by mouth every 12 (twelve) hours. For chronic pain- can refill by 01/13/23 12/22/22  Yes Lovorn, Aundra Millet, MD  naloxone [facility]) nasal spray 4 mg/0.1 mL Place 1 spray into the nose once as needed (AS DIRECTED). 08/08/21  Yes [provider]  norethindrone (AYGESTIN) 5 MG tablet Take 2 tablets (10 mg total) by mouth daily. 12/30/22 01/29/23 Yes [name], DO  nystatin (MYCOSTATIN/NYSTOP) powder Apply 1 Application topically as needed (rash). 06/16/22  Yes Shamleffer, Konrad Dolores, MD  nystatin ointment (MYCOSTATIN) Apply 1 Application topically 2 (two) times daily. Patient taking differently: Apply 1 Application topically 2 (two) times daily as needed (for rashes). 12/27/21  Yes Adline Potter, NP  ondansetron (ZOFRAN-ODT) 8 MG disintegrating tablet Take 1 tablet (8 mg total) by mouth every 6 (six) hours as needed for nausea or vomiting. 01/13/22  Yes Mechele Claude, MD  OXcarbazepine (TRILEPTAL) 150 MG tablet Take 1 tablet (150 mg total) by mouth 2 (two) times daily. 06/13/22  Yes Sheikh, Omair Latif, DO  pantoprazole (PROTONIX) 40 MG tablet Take 40 mg by mouth daily as needed (for reflux).   Yes [provider]  pregabalin (LYRICA) 200 MG capsule Take 200 mg by mouth 2 (two) times daily.   Yes [provider]   senna-docusate (SENOKOT-S) 8.6-50 MG tablet Take 1 tablet by mouth at bedtime. Patient taking differently: Take 1 tablet by mouth at bedtime as needed (for constipation). 06/13/22  Yes Sheikh, Omair Latif, DO  tirzepatide [facility]) 5 MG/0.5ML Pen Inject 5 mg into the skin once a week. Patient taking differently: Inject 5 mg into the skin every Sunday. 11/11/22  Yes Shamleffer, Konrad Dolores, MD  TYLENOL 500 MG tablet Take 500-1,000 mg by mouth every 6 (six) hours as needed for mild pain or headache.   Yes [provider]  Vitamin D, Cholecalciferol, 25 MCG (1000 UT) TABS Take 1,000 Units by mouth daily in the afternoon. 07/26/20  Yes [provider]  diphenhydrAMINE (BENADRYL) 50 MG tablet Take 1 tablet (50 mg total) by mouth once for 1 dose. Take 1 hour prior to CT scan Patient not taking: Reported on 01/28/2023 01/05/23 01/28/23  [name], MD  furosemide (LASIX) 40 MG tablet Take 1 tablet (40 mg total) by mouth 2 (two) times daily. Patient not taking: Reported on 01/28/2023 01/18/23 04/18/23  Narda Bonds, MD  Insulin Pen Needle 29G X MISC 1 Device by Does not apply route daily in the afternoon. 05/28/22   Shamleffer, Konrad Dolores, MD  LORazepam (ATIVAN) 1 MG tablet Take 0.5 tablets (0.5 mg total) by mouth 2 (two) times daily. Patient not taking: Reported on 01/28/2023 09/04/22   Maretta Bees, MD    Inpatient Medications: Scheduled Meds:  apixaban  5 mg Oral BID   ARIPiprazole  2 mg Oral QHS   cyanocobalamin  1,000 mcg Oral Daily   doxepin  10 mg Oral QHS   escitalopram  10 mg Oral Daily   insulin aspart  0-15 Units Subcutaneous TID WC   insulin glargine-yfgn  30 Units Subcutaneous BID   LORazepam  0.5 mg Oral BID   metoprolol tartrate  50 mg Oral BID   mometasone-formoterol  2 puff Inhalation BID   morphine  15 mg Oral Q12H   OXcarbazepine  150 mg Oral BID   pregabalin  200 mg Oral QHS  Continuous Infusions:  PRN Meds: acetaminophen **OR**  acetaminophen, ipratropium-albuterol, ondansetron **OR** ondansetron (ZOFRAN) IV, traZODone  Allergies:    Allergies  Allergen Reactions   Iodine Anaphylaxis   Ivp Dye [Iodinated Contrast Media] Anaphylaxis, Swelling and Other (See Comments)    Throat closes    Latex Other (See Comments)    Latex tape pulls skin with it   Tape Other (See Comments)    Pulls off the skin, if latex   Tizanidine Other (See Comments)    Weakness, goofy, bad dreams    Social History:   Social History   Socioeconomic History   Marital status: Married    Spouse name: Dwight   Number of children: 3   Years of education: 15   Highest education level: Associate degree: academic program  Occupational History   Occupation: Retired    Comment: Chief Financial Officer  Tobacco Use   Smoking status: Never   Smokeless tobacco: Never  Vaping Use   Vaping status: Never Used  Substance and Sexual Activity   Alcohol use: Not Currently    Alcohol/week: 0.0 standard drinks of alcohol   Drug use: No   Sexual activity: Yes    Partners: Male    Birth control/protection: Post-menopausal  Other Topics Concern   Not on file  Social History Narrative   Lives at home with husband.    They have 3 children who live away - [place], [place], and [place].   She is from [place] and most of her family lives there.   Her husbands's family live nearby   Right handed.   Social Determinants of Health   Financial Resource Strain: Low Risk  (01/23/2023)   Overall Financial Resource Strain (CARDIA)    Difficulty of Paying Living Expenses: Not hard at all  Food Insecurity: No Food Insecurity (01/28/2023)   Hunger Vital Sign    Worried About Running Out of Food in the Last Year: Never true    Ran Out of Food in the Last Year: Never true  Transportation Needs: No Transportation Needs (01/28/2023)   PRAPARE - Administrator, Civil Service (Medical): No    Lack of Transportation (Non-Medical): No  Physical Activity:  Inactive (01/23/2023)   Exercise Vital Sign    Days of Exercise per Week: 0 days    Minutes of Exercise per Session: 0 min  Stress: No Stress Concern Present (01/23/2023)   [employer] of Occupational Health - Occupational Stress Questionnaire    Feeling of Stress : Not at all  Social Connections: Moderately Integrated (01/23/2023)   Social Connection and Isolation Panel [NHANES]    Frequency of Communication with Friends and Family: More than three times a week    Frequency of Social Gatherings with Friends and Family: More than three times a week    Attends Religious Services: More than 4 times per year    Active Member of [employer] or Organizations: No    Attends Banker Meetings: Never    Marital Status: Married  Catering manager Violence: Not At Risk (01/28/2023)   Humiliation, Afraid, Rape, and Kick questionnaire    Fear of Current or Ex-Partner: No    Emotionally Abused: No    Physically Abused: No    Sexually Abused: No    Family History:    Family History  Problem Relation Age of Onset   Diabetes Mother    Heart disease Mother    Alzheimer's disease Father    Heart disease Sister  CABG   Diabetes Sister    Alcohol abuse Sister    Stroke Brother    Heart disease Brother    Mental illness Brother    Diabetes Brother    Breast cancer Neg Hx    Ovarian cancer Neg Hx    Colon cancer Neg Hx    Endometrial cancer Neg Hx      ROS:  Please see the history of present illness.   All other ROS reviewed and negative.     Physical Exam/Data:   Vitals:   01/28/23 1640 01/28/23 1651 01/28/23 2045 01/29/23 0514  BP: 120/73  124/73 125/64  Pulse: 67  77 70  Resp: 18  (!) 23 18  Temp: 97.9 F (36.6 C)  97.9 F (36.6 C) 97.7 F (36.5 C)  TempSrc: Oral  Oral Oral  SpO2: 100%  98% 99%  Weight:  81.4 kg    Height:  5\' 2"  (1.575 m)      Intake/Output Summary (Last 24 hours) at 01/29/2023 0759 Last data filed at 01/28/2023 1702 Gross per 24 hour   Intake --  Output 300 ml  Net -300 ml      01/28/2023    4:51 PM 01/28/2023    9:16 AM 01/26/2023    2:58 PM  Last 3 Weights  Weight (lbs) 179 lb 7.3 oz 196 lb 196 lb 12.8 oz  Weight (kg) 81.4 kg 88.905 kg 89.268 kg     Body mass index is 32.82 kg/m.  General:  Well nourished, well developed, in no acute distress HEENT: normal Neck: no JVD Vascular: No carotid bruits; Distal pulses 2+ bilaterally Cardiac:  normal S1, S2; RRR; no murmur  Lungs:  clear to auscultation bilaterally, no wheezing, rhonchi or rales  Abd: soft, nontender, no hepatomegaly  Ext: no edema Musculoskeletal:  No deformities, BUE and BLE strength normal and equal Skin: warm and dry  Neuro:  CNs 2-12 intact, no focal abnormalities noted Psych:  Normal affect   EKG:  The EKG was personally reviewed and demonstrates: Atrial fibrillation, initial EKG showed atrial fibrillation with RVR, heart rate improved to 80s on repeat EKG.  No significant ST-T wave changes. Telemetry:  Telemetry was personally reviewed and demonstrates: Atrial fibrillation, heart rate well-controlled in the 80s to 90s.  Relevant CV Studies:  Cath 02/03/2018 Prox LAD lesion is 40% stenosed. Mid LAD lesion is 30% stenosed. Ost 1st Mrg to 1st Mrg lesion is 30% stenosed. LV end diastolic pressure is moderately elevated.   1. Nonobstructive CAD with left dominant circulation 2. Moderately elevated LVEDP (post hydration)   Plan: medical therapy. Will resume Eliquis this pm. Continue diuretic therapy.   No indication for antiplatelet therapy at this time.    Echo 01/14/2023  1. Left ventricular ejection fraction, by estimation, is 60 to 65%. The  left ventricle has normal function. The left ventricle has no regional  wall motion abnormalities. There is mild concentric left ventricular  hypertrophy. Left ventricular diastolic  function could not be evaluated. Elevated left atrial pressure.   2. Right ventricular systolic function is  normal. The right ventricular  size is normal. Tricuspid regurgitation signal is inadequate for assessing  PA pressure.   3. Left atrial size was severely dilated.   4. Right atrial size was mildly dilated.   5. The mitral valve is normal in structure. Trivial mitral valve  regurgitation. No evidence of mitral stenosis. Moderate mitral annular  calcification.   6. The aortic valve has an indeterminant number  of cusps. Aortic valve  regurgitation is mild. No aortic stenosis is present.   7. Aortic dilatation noted. There is mild dilatation of the aortic root,  measuring 40 mm.   8. The inferior vena cava is dilated in size with <50% respiratory  variability, suggesting right atrial pressure of 15 mmHg.   Laboratory Data:  High Sensitivity Troponin:   Recent Labs  Lab 01/13/23 0034 01/15/23 0305 01/15/23 0541 01/28/23 1025 01/28/23 1205  TROPONINIHS 838* 73* 64* 21* 28*     Chemistry Recent Labs  Lab 01/28/23 1025 01/28/23 1730 01/29/23 0356  NA 132*  --  135  K 5.4* 4.5 4.3  CL 99  --  99  CO2 26  --  29  GLUCOSE 383*  --  233*  BUN 18  --  17  CREATININE 0.88  --  0.91  CALCIUM 9.0  --  8.5*  GFRNONAA >60  --  >60  ANIONGAP 7  --  7    No results for input(s): "PROT", "ALBUMIN", "AST", "ALT", "ALKPHOS", "BILITOT" in the last 168 hours. Lipids No results for input(s): "CHOL", "TRIG", "HDL", "LABVLDL", "LDLCALC", "CHOLHDL" in the last 168 hours.  Hematology Recent Labs  Lab 01/28/23 1025  WBC 10.7*  RBC 4.23  HGB 11.8*  HCT 38.3  MCV 90.5  MCH 27.9  MCHC 30.8  RDW 15.0  PLT 217   Thyroid No results for input(s): "TSH", "FREET4" in the last 168 hours.  BNP Recent Labs  Lab 01/28/23 1025  BNP 355.3*    DDimer No results for input(s): "DDIMER" in the last 168 hours.   Radiology/Studies:  DG Foot 2 Views Left  Result Date: 01/28/2023 CLINICAL DATA:  Fall.  Pain over small toe. EXAM: LEFT ANKLE - 2 VIEW; LEFT FOOT - 2 VIEW COMPARISON:  None Available.  FINDINGS: There is subtle step-off along the distal articular surface of the proximal phalanx of fifth toe. Correlate clinically for minimally displaced fracture. No other acute fracture or dislocation. No aggressive osseous lesion. Ankle mortise appears intact. There are mild degenerative changes of imaged joints. Calcaneal spur noted along the Achilles tendon and Plantar aponeurosis attachment sites. No focal soft tissue swelling. No radiopaque foreign bodies. IMPRESSION: 1. Subtle step-off along the distal articular surface of the proximal phalanx of fifth toe. Correlate clinically for minimally displaced fracture. Electronically Signed   By: Jules Schick M.D.   On: 01/28/2023 10:16   DG Ankle 2 Views Left  Result Date: 01/28/2023 CLINICAL DATA:  Fall.  Pain over small toe. EXAM: LEFT ANKLE - 2 VIEW; LEFT FOOT - 2 VIEW COMPARISON:  None Available. FINDINGS: There is subtle step-off along the distal articular surface of the proximal phalanx of fifth toe. Correlate clinically for minimally displaced fracture. No other acute fracture or dislocation. No aggressive osseous lesion. Ankle mortise appears intact. There are mild degenerative changes of imaged joints. Calcaneal spur noted along the Achilles tendon and Plantar aponeurosis attachment sites. No focal soft tissue swelling. No radiopaque foreign bodies. IMPRESSION: 1. Subtle step-off along the distal articular surface of the proximal phalanx of fifth toe. Correlate clinically for minimally displaced fracture. Electronically Signed   By: Jules Schick M.D.   On: 01/28/2023 10:16   DG Chest 2 View  Result Date: 01/28/2023 CLINICAL DATA:  Shortness of breath EXAM: CHEST - 2 VIEW COMPARISON:  X-ray 01/15/2023 FINDINGS: Enlarged cardiopericardial silhouette. Prominence of the central vasculature. Bronchovascular crowding. No pneumothorax or effusion film is under penetrated. Overlapping cardiac leads.  Right shoulder reverse arthroplasty identified.  IMPRESSION: Enlarged heart. Prominence of the central vasculature but the lungs are underinflated. Under penetrated radiograph. Electronically Signed   By: Karen Kays M.D.   On: 01/28/2023 09:49   MR Pelvis W Wo Contrast  Result Date: 01/26/2023 CLINICAL DATA:  Endometrial cancer, for staging EXAM: MRI PELVIS WITHOUT AND WITH CONTRAST TECHNIQUE: Multiplanar multisequence MR imaging of the pelvis was performed both before and after administration of intravenous contrast. CONTRAST:  10mL GADAVIST GADOBUTROL 1 MMOL/ML IV SOLN COMPARISON:  CT abdomen/pelvis dated 01/13/2023. FINDINGS: Urinary Tract: Bladder is mildly thick-walled although underdistended. Bowel: Mild sigmoid diverticulosis, without associated inflammatory changes. Vascular/Lymphatic: No evidence of aneurysm. No suspicious pelvic lymphadenopathy. Reproductive: 3.9 x 3.2 x 2.5 cm endometrial mass (series 4/image 20), with suspected myometrial invasion along the posterior fundus (series 11/image 42), with suspected ~50% myometrial invasion. No involvement of the cervix. Bilateral ovaries are within normal limits. Other:  No pelvic ascites. Musculoskeletal: No focal osseous lesions. Subcutaneous edema/postprocedural changes in the bilateral gluteal region. IMPRESSION: 3.9 cm endometrial mass, with suspected 50% myometrial invasion, as above. This is considered borderline for FIGO stage IA/1B by MRI, pending histology. No evidence of metastatic disease in the pelvis. Electronically Signed   By: Charline Bills M.D.   On: 01/26/2023 13:33     Assessment and Plan:   Acute on chronic diastolic heart failure: Over the past several months, she had multiple diuretic dose adjustment.  She was initially discharged in January 2024 with 40 mg twice daily of Lasix, by the time she was followed up in the transitional care clinic, she is self-reported 20 mg twice daily of Lasix.  By the time she followed up with Dr. Mayford Knife in April, she was using 40 mg Lasix  as needed.  Lasix was changed to 20 mg daily during PCP office visit on 10/08/2022.  Lasix was stopped at the time of discharge in June 2024 after admission for cystitis.  She was readmitted recently in August with recurrent heart failure.  Admission note says she was still on 20 mg daily of Lasix prior to admission, she was ultimately discharged on 40 mg oral Lasix twice a day dosing.   - Despite the frequent change in her diuretic dose, since the last admission, she says her husband has been managing her diuretic therapy and she believe she has been compliant with 40 mg twice a day of oral Lasix. -Previous dry weight was 190 pounds.  She arrived with a weight of 196 pounds yesterday morning, however by yesterday afternoon, her weight was 179 pounds, this is likely not accurate, will asked her nurse to repeat a weight for this morning.  Increase IV Lasix to 40 mg twice a day. -Unfortunately, patient is not a candidate for SGLT2 inhibitor as she had history of recurrent cystitis and yeast infection on Jardiance.  Even though her EF is normal, may need to consider spironolactone on that side to help with diuresis.  Permanent atrial fibrillation: Heart rate 130s to 140s on arrival, patient says she has been compliant with metoprolol tartrate 50 mg twice daily.  Current heart rate after diuresis is around 80s to 90s.  Will need to ambulate the patient with assistance at some point to see if how fast heart rate jumps up with activity.  If there is significant increase in heart rate with minimal activity, may need to uptitrate rate control further.  Endometrial cancer: Recently diagnosed with endometrial cancer.  She was felt to be  a poor candidate for surgery given recurrent heart failure and chronic respiratory failure.  She was going to radiation oncology service yesterday for appointment to discuss alternative treatment when she was readmitted to the hospital.  Nonobstructive CAD: Denies any chest discomfort  prior to this admission  Hypertension: Blood pressure stable  Hyperlipidemia: Resume home Lipitor  DM2: Previously uncontrolled.  Per primary team.  Left fifth toe fracture: Occurred yesterday while trying to get into her car and subsequently fell.  Managed by primary team.   Risk Assessment/Risk Scores:        New York Heart Association (NYHA) Functional Class NYHA Class IV  CHA2DS2-VASc Score = 7   This indicates a 11.2% annual risk of stroke. The patient's score is based upon: CHF History: 1 HTN History: 1 Diabetes History: 1 Stroke History: 0 Vascular Disease History: 1 Age Score: 2 Gender Score: 1         For questions or updates, please contact [facility] Please consult [email or web address] for contact info under    Ramond Dial, [place]  01/29/2023 7:59 AM

## 2023-01-29 NOTE — Plan of Care (Signed)

## 2023-01-30 DIAGNOSIS — I5031 Acute diastolic (congestive) heart failure: Secondary | ICD-10-CM | POA: Diagnosis not present

## 2023-01-30 LAB — BASIC METABOLIC PANEL
Anion gap: 10 (ref 5–15)
BUN: 17 mg/dL (ref 8–23)
CO2: 25 mmol/L (ref 22–32)
Calcium: 8.4 mg/dL — ABNORMAL LOW (ref 8.9–10.3)
Chloride: 95 mmol/L — ABNORMAL LOW (ref 98–111)
Creatinine, Ser: 0.75 mg/dL (ref 0.44–1.00)
GFR, Estimated: >60 mL/min (ref >60)
Glucose, Bld: 163 mg/dL — ABNORMAL HIGH (ref 70–99)
Potassium: 4 mmol/L (ref 3.5–5.1)
Sodium: 130 mmol/L — ABNORMAL LOW (ref 135–145)

## 2023-01-30 LAB — GLUCOSE, CAPILLARY
Glucose-Capillary: 199 mg/dL — ABNORMAL HIGH (ref 70–99)
Glucose-Capillary: 253 mg/dL — ABNORMAL HIGH (ref 70–99)

## 2023-01-30 MED ORDER — SPIRONOLACTONE 25 MG PO TABS: 12.5000 mg | ORAL_TABLET | Freq: Every day | ORAL | 2 refills | Status: DC

## 2023-01-30 NOTE — Progress Notes (Signed)
Patient down to 2L most of the night, sometimes she will have it off and still sating in the mid 90s. Still shortness of breath with activity.

## 2023-01-30 NOTE — Discharge Instructions (Addendum)
Amber Stephenson,  You were in the hospital because of difficulty breathing. This may have been in part due to fluids and you were given medication to diurese by the cardiologist. You have improved. Please follow-up with your PCP and cardiologist. You were also found to have a foot fracture. Please follow-up with the orthopedic surgeon as recommended. Please use the post-op shoe when moving around.

## 2023-01-30 NOTE — Discharge Summary (Signed)
Physician Discharge Summary   Patient: Amber Stephenson MRN: [id number] DOB: 1946-04-11  Admit date:     01/28/2023  Discharge date: 01/30/23  Discharge Physician: Jacquelin Hawking, MD   PCP: Mechele Claude, MD   Recommendations at discharge:  PCP and cardiology follow-up.  Discharge Diagnoses: Principal Problem:   Acute heart failure (HCC)  Resolved Problems:   * No resolved hospital problems. *  Hospital Course: Amber Stephenson is a 77 y.o. female with a history of chronic diastolic heart failure, chronic atrial fibrillation on Eliquis, chronic respiratory failure with hypoxia on 2 L/min of oxygen, insulin-dependent type 2 diabetes mellitus, primary hypertension, hyperlipidemia, bipolar 1 disorder, endometrial cancer.  Patient presented secondary to shortness of breath with concern for acute heart failure.  Cardiology consulted. Lasix diuresis started. Patient's respiratory status improved to baseline prior to discharge. Patient was also found to have a left foot fracture. Post-op shoe and outpatient orthopedic surgery follow-up recommended.  Assessment and Plan:  Chronic respiratory failure with hypoxia Hypoxia Patient recently weaned to room air from prior admission. Patient placed on 4 L/min of [place] this admission and weaned to 2 L/min.   Acute on chronic diastolic heart failure Unclear trigger. Recent echocardiogram was significant for an LVEF of 60 to 65% with indeterminant diastolic function.  Cardiology consulted and patient was started on Lasix IV diuresis. Last discharge weight of 86.1 kg. Weight documented as 88.9 kg on admission. Patient diuresed during admission. Weight not significantly changed on day of discharge at 89 kg. Cardiology started spironolactone and recommended to continue home Lasix dose.   Hyperkalemia Mild. Resolved with Lasix.   Permanent atrial fibrillation Patient is managed on metoprolol 50 mg twice daily and Eliquis as an outpatient. Continue  metoprolol and Eliquis.   Left foot fracture Fracture involves the proximal phalanx of fifth toe. Post-op shoe ordered with recommendation for orthopedic surgery follow-up. Discussed with Dr. Eulah Pont.  Left knee/leg pain Related to fall. Left knee and tib/fib x-rays were negative for acute fracture. Likely contusion. Recommendation to keep ice to relieve inflammation.   Dependent diabetes mellitus type 2 Uncontrolled with hemoglobin A1c of 11.3%.  Patient is on insulin glargine 35 units twice daily in addition to insulin lispro 3 times daily via sliding scale, metformin. Continue home regimen.   Chronic pain Continue MS Contin 15 mg twice daily   Hyponatremia Appears to be a component of pseudohyponatremia secondary to hyperglycemia.  Improved.  Corrected sodium of 137.   Nonobstructive CAD Noted.  Patient with mildly elevated troponin, unlikely ACS. Negative delta.   Hyperlipidemia Continue Lipitor.   Anxiety Continue home Ativan.   Endometrial cancer Patient follows with Dr. [name], gynecology oncology   Obesity Estimated body mass index is 35.89 kg/m as calculated from the following:   Height as of this encounter: 5\' 2"  (1.575 m).   Weight as of this encounter: 89 kg.  Consultants: Cardiology Procedures performed: None  Disposition: Home Diet recommendation: Cardiac and Carb modified diet   DISCHARGE MEDICATION: Allergies as of 01/30/2023       Reactions   Iodine Anaphylaxis   Ivp Dye [iodinated Contrast Media] Anaphylaxis, Swelling, Other (See Comments)   Throat closes   Latex Other (See Comments)   Latex tape pulls skin with it   Tape Other (See Comments)   Pulls off the skin, if latex   Tizanidine Other (See Comments)   Weakness, goofy, bad dreams        Medication List  STOP taking these medications    diphenhydrAMINE 50 MG tablet Commonly known as: BENADRYL   lisinopril 10 MG tablet Commonly known as: ZESTRIL   losartan 25 MG  tablet Commonly known as: COZAAR       TAKE these medications    albuterol 108 (90 Base) MCG/ACT inhaler Commonly known as: VENTOLIN HFA Inhale 2 puffs into the lungs every 6 (six) hours as needed for wheezing or shortness of breath.   allopurinol 100 MG tablet Commonly known as: ZYLOPRIM Take 100 mg by mouth daily.   apixaban 5 MG Tabs tablet Commonly known as: Eliquis Take 1 tablet (5 mg total) by mouth 2 (two) times daily.   ARIPiprazole 2 MG tablet Commonly known as: ABILIFY Take 2 mg by mouth at bedtime.   atorvastatin 40 MG tablet Commonly known as: LIPITOR Take 1 tablet (40 mg total) by mouth daily.   Belsomra 20 MG Tabs Generic drug: Suvorexant Take 20 mg by mouth at bedtime as needed (for sleep).   cyanocobalamin 1000 MCG tablet Commonly known as: VITAMIN B12 Take 1 tablet (1,000 mcg total) by mouth daily.   Dexcom G7 Sensor Misc Change sensor every 10 days What changed:  how much to take how to take this when to take this additional instructions   doxepin 10 MG capsule Commonly known as: SINEQUAN Take 10 mg by mouth at bedtime.   Emgality 120 MG/ML Soaj Generic drug: Galcanezumab-gnlm INJECT CONTENTS ON 1 PEN(120MG ) INTO THE SKIN ONCE MONTHLY What changed:  how much to take how to take this when to take this additional instructions   escitalopram 10 MG tablet Commonly known as: LEXAPRO Take 10 mg by mouth daily.   fluticasone-salmeterol 100-50 MCG/ACT Aepb Commonly known as: Advair Diskus Inhale 1 puff into the lungs 2 (two) times daily. What changed:  when to take this reasons to take this   furosemide 20 MG tablet Commonly known as: LASIX Take 20 mg by mouth See admin instructions. Take 20 mg by mouth at 8 AM and 6 PM What changed: Another medication with the same name was removed. Continue taking this medication, and follow the directions you see here.   insulin lispro 100 UNIT/ML KwikPen Commonly known as: HumaLOG KwikPen Max  daily 50 units What changed:  how much to take how to take this when to take this additional instructions   Insulin Pen Needle 29G X Misc 1 Device by Does not apply route daily in the afternoon.   Lantus SoloStar 100 UNIT/ML Solostar Pen Generic drug: insulin glargine Inject 35 Units into the skin 2 (two) times daily.   LORazepam 0.5 MG tablet Commonly known as: ATIVAN Take 0.5-1 mg by mouth See admin instructions. Take 0.5 mg by mouth in the morning and 1 mg at bedtime What changed: Another medication with the same name was removed. Continue taking this medication, and follow the directions you see here.   metFORMIN 500 MG 24 hr tablet Commonly known as: GLUCOPHAGE-XR Take 2 tablets (1,000 mg total) by mouth daily with breakfast.   metoprolol tartrate 50 MG tablet Commonly known as: LOPRESSOR Take 1 tablet (50 mg total) by mouth 2 (two) times daily.   morphine 15 MG 12 hr tablet Commonly known as: MS Contin Take 1 tablet (15 mg total) by mouth every 12 (twelve) hours. For chronic pain- can refill by 01/13/23   Mounjaro 5 MG/0.5ML Pen Generic drug: tirzepatide Inject 5 mg into the skin once a week. What changed: when to take  this   naloxone 4 MG/0.1ML Liqd nasal spray kit Commonly known as: NARCAN Place 1 spray into the nose once as needed (AS DIRECTED).   norethindrone 5 MG tablet Commonly known as: AYGESTIN Take 2 tablets (10 mg total) by mouth daily.   nystatin ointment Commonly known as: MYCOSTATIN Apply 1 Application topically 2 (two) times daily. What changed:  when to take this reasons to take this   nystatin powder Commonly known as: MYCOSTATIN/NYSTOP Apply 1 Application topically as needed (rash). What changed: Another medication with the same name was changed. Make sure you understand how and when to take each.   ondansetron 8 MG disintegrating tablet Commonly known as: ZOFRAN-ODT Take 1 tablet (8 mg total) by mouth every 6 (six) hours as needed  for nausea or vomiting.   OXcarbazepine 150 MG tablet Commonly known as: TRILEPTAL Take 1 tablet (150 mg total) by mouth 2 (two) times daily.   pantoprazole 40 MG tablet Commonly known as: PROTONIX Take 40 mg by mouth daily as needed (for reflux).   pregabalin 200 MG capsule Commonly known as: LYRICA Take 200 mg by mouth 2 (two) times daily.   Prolia 60 MG/ML Sosy injection Generic drug: denosumab Inject 60 mg into the skin every 6 (six) months.   senna-docusate 8.6-50 MG tablet Commonly known as: Senokot-S Take 1 tablet by mouth at bedtime. What changed:  when to take this reasons to take this   spironolactone 25 MG tablet Commonly known as: ALDACTONE Take 0.5 tablets (12.5 mg total) by mouth daily. Start taking on: January 31, 2023   TYLENOL 500 MG tablet Generic drug: acetaminophen Take 500-1,000 mg by mouth every 6 (six) hours as needed for mild pain or headache.   Vitamin D (Cholecalciferol) 25 MCG (1000 UT) Tabs Take 1,000 Units by mouth daily in the afternoon.        Follow-up Information     Sheral Apley, MD. Schedule an appointment as soon as possible for a visit in 2 week(s).   Specialty: Orthopedic Surgery Contact information: [address] [place] [address] [phone]         Mechele Claude, MD. Schedule an appointment as soon as possible for a visit in 1 week(s).   Specialty: Family Medicine Why: For hospital follow-up Contact information: [address] [place] [address] [phone]         Quintella Reichert, MD. Schedule an appointment as soon as possible for a visit today.   Specialty: Cardiology Why: For hospital follow-up Contact information: [address] [place] [address] [phone]                Discharge Exam: BP 124/89 (BP Location: Right Arm)   Pulse 88   Temp 98.1 F (36.7 C) (Oral)   Resp (!) 24   Ht 5\' 2"  (1.575 m)   Wt 89 kg   SpO2 92%   BMI 35.89  kg/m   General exam: Appears calm and comfortable  Respiratory system: Clear to auscultation. Respiratory effort normal. Cardiovascular system: S1 & S2 heard, RRR. No murmurs, rubs, gallops or clicks. Gastrointestinal system: Abdomen is nondistended, soft and nontender. Central nervous system: Alert and oriented. No focal neurological deficits. Musculoskeletal: No edema. No calf tenderness Skin: No cyanosis. No rashes Psychiatry: Judgement and insight appear normal. Mood & affect appropriate.   Condition at discharge: stable  The results of significant diagnostics from this hospitalization (including imaging, microbiology, ancillary and laboratory) are  listed below for reference.   Imaging Studies: DG Tibia/Fibula Left  Result Date: 01/29/2023 CLINICAL DATA:  Fall, pain EXAM: LEFT TIBIA AND FIBULA - 2 VIEW COMPARISON:  None Available. FINDINGS: There is no evidence of fracture or other focal bone lesions. Soft tissues are unremarkable. IMPRESSION: No fracture or dislocation of the left tibia or fibula. Electronically Signed   By: Jearld Lesch M.D.   On: 01/29/2023 15:43   DG Knee 3 Views Left  Result Date: 01/29/2023 CLINICAL DATA:  Left knee pain after fall. EXAM: LEFT KNEE - 3 VIEW COMPARISON:  None Available. FINDINGS: No evidence of fracture, dislocation, or joint effusion. Minimal narrowing of medial joint space is noted with osteophyte formation. Soft tissues are unremarkable. IMPRESSION: Minimal degenerative joint disease is noted medially. No acute abnormality seen. Electronically Signed   By: Lupita Raider M.D.   On: 01/29/2023 15:42   DG Foot 2 Views Left  Result Date: 01/28/2023 CLINICAL DATA:  Fall.  Pain over small toe. EXAM: LEFT ANKLE - 2 VIEW; LEFT FOOT - 2 VIEW COMPARISON:  None Available. FINDINGS: There is subtle step-off along the distal articular surface of the proximal phalanx of fifth toe. Correlate clinically for minimally displaced fracture. No other acute  fracture or dislocation. No aggressive osseous lesion. Ankle mortise appears intact. There are mild degenerative changes of imaged joints. Calcaneal spur noted along the Achilles tendon and Plantar aponeurosis attachment sites. No focal soft tissue swelling. No radiopaque foreign bodies. IMPRESSION: 1. Subtle step-off along the distal articular surface of the proximal phalanx of fifth toe. Correlate clinically for minimally displaced fracture. Electronically Signed   By: Jules Schick M.D.   On: 01/28/2023 10:16   DG Ankle 2 Views Left  Result Date: 01/28/2023 CLINICAL DATA:  Fall.  Pain over small toe. EXAM: LEFT ANKLE - 2 VIEW; LEFT FOOT - 2 VIEW COMPARISON:  None Available. FINDINGS: There is subtle step-off along the distal articular surface of the proximal phalanx of fifth toe. Correlate clinically for minimally displaced fracture. No other acute fracture or dislocation. No aggressive osseous lesion. Ankle mortise appears intact. There are mild degenerative changes of imaged joints. Calcaneal spur noted along the Achilles tendon and Plantar aponeurosis attachment sites. No focal soft tissue swelling. No radiopaque foreign bodies. IMPRESSION: 1. Subtle step-off along the distal articular surface of the proximal phalanx of fifth toe. Correlate clinically for minimally displaced fracture. Electronically Signed   By: Jules Schick M.D.   On: 01/28/2023 10:16   DG Chest 2 View  Result Date: 01/28/2023 CLINICAL DATA:  Shortness of breath EXAM: CHEST - 2 VIEW COMPARISON:  X-ray 01/15/2023 FINDINGS: Enlarged cardiopericardial silhouette. Prominence of the central vasculature. Bronchovascular crowding. No pneumothorax or effusion film is under penetrated. Overlapping cardiac leads. Right shoulder reverse arthroplasty identified. IMPRESSION: Enlarged heart. Prominence of the central vasculature but the lungs are underinflated. Under penetrated radiograph. Electronically Signed   By: Karen Kays M.D.   On:  01/28/2023 09:49   MR Pelvis W Wo Contrast  Result Date: 01/26/2023 CLINICAL DATA:  Endometrial cancer, for staging EXAM: MRI PELVIS WITHOUT AND WITH CONTRAST TECHNIQUE: Multiplanar multisequence MR imaging of the pelvis was performed both before and after administration of intravenous contrast. CONTRAST:  10mL GADAVIST GADOBUTROL 1 MMOL/ML IV SOLN COMPARISON:  CT abdomen/pelvis dated 01/13/2023. FINDINGS: Urinary Tract: Bladder is mildly thick-walled although underdistended. Bowel: Mild sigmoid diverticulosis, without associated inflammatory changes. Vascular/Lymphatic: No evidence of aneurysm. No suspicious pelvic lymphadenopathy. Reproductive: 3.9 x 3.2 x  2.5 cm endometrial mass (series 4/image 20), with suspected myometrial invasion along the posterior fundus (series 11/image 42), with suspected ~50% myometrial invasion. No involvement of the cervix. Bilateral ovaries are within normal limits. Other:  No pelvic ascites. Musculoskeletal: No focal osseous lesions. Subcutaneous edema/postprocedural changes in the bilateral gluteal region. IMPRESSION: 3.9 cm endometrial mass, with suspected 50% myometrial invasion, as above. This is considered borderline for FIGO stage IA/1B by MRI, pending histology. No evidence of metastatic disease in the pelvis. Electronically Signed   By: Charline Bills M.D.   On: 01/26/2023 13:33   DG Chest Port 1 View  Result Date: 01/15/2023 CLINICAL DATA:  Chest pain EXAM: PORTABLE CHEST 1 VIEW COMPARISON:  01/12/2023 FINDINGS: Cardiac shadow is enlarged but stable. Lungs are well aerated bilaterally. Left basilar airspace opacity is noted. No other focal abnormality is noted. IMPRESSION: Left basilar airspace opacity. Electronically Signed   By: Alcide Clever M.D.   On: 01/15/2023 03:58   ECHOCARDIOGRAM COMPLETE  Result Date: 01/14/2023    ECHOCARDIOGRAM REPORT   Patient Name:   Amber Stephenson Wilden Date of Exam: 01/14/2023 Medical Rec #:  [id number]         Height:       62.0 in  Accession #:    [id number]        Weight:       193.3 lb Date of Birth:  [birth year]          BSA:          1.884 m Patient Age:    77 years          BP:           112/71 mmHg Patient Gender: F                 HR:           77 bpm. Exam Location:  Inpatient Procedure: 2D Echo, Color Doppler and Cardiac Doppler Indications:    CHF-Acute Diastolic I50.31  History:        Patient has prior history of Echocardiogram examinations, most                 recent 11/10/2022. CHF and Cardiomegaly, CAD, Pulmonary HTN,                 Arrythmias:Atrial Fibrillation, Signs/Symptoms:Hypotension; Risk                 Factors:Diabetes, Non-Smoker, Hypertension, Sleep Apnea and                 Dyslipidemia.  Sonographer:    Aron Baba Referring Phys: [id number] Cyndi Bender  Sonographer Comments: Patient is obese and suboptimal parasternal window. Image acquisition challenging due to respiratory motion. IMPRESSIONS  1. Left ventricular ejection fraction, by estimation, is 60 to 65%. The left ventricle has normal function. The left ventricle has no regional wall motion abnormalities. There is mild concentric left ventricular hypertrophy. Left ventricular diastolic function could not be evaluated. Elevated left atrial pressure.  2. Right ventricular systolic function is normal. The right ventricular size is normal. Tricuspid regurgitation signal is inadequate for assessing PA pressure.  3. Left atrial size was severely dilated.  4. Right atrial size was mildly dilated.  5. The mitral valve is normal in structure. Trivial mitral valve regurgitation. No evidence of mitral stenosis. Moderate mitral annular calcification.  6. The aortic valve has an indeterminant number of cusps. Aortic valve regurgitation is mild. No aortic stenosis is present.  7. Aortic dilatation noted. There is mild dilatation of the aortic root, measuring 40 mm.  8. The inferior vena cava is dilated in size with <50% respiratory variability, suggesting right atrial pressure  of 15 mmHg. FINDINGS  Left Ventricle: Left ventricular ejection fraction, by estimation, is 60 to 65%. The left ventricle has normal function. The left ventricle has no regional wall motion abnormalities. The left ventricular internal cavity size was normal in size. There is  mild concentric left ventricular hypertrophy. Left ventricular diastolic function could not be evaluated due to atrial fibrillation. Left ventricular diastolic function could not be evaluated. Elevated left atrial pressure. Right Ventricle: The right ventricular size is normal. Right ventricular systolic function is normal. Tricuspid regurgitation signal is inadequate for assessing PA pressure. The tricuspid regurgitant velocity is 0.72 m/s, and with an assumed right atrial  pressure of 15 mmHg, the estimated right ventricular systolic pressure is 17.1 mmHg. Left Atrium: Left atrial size was severely dilated. Right Atrium: Right atrial size was mildly dilated. Pericardium: There is no evidence of pericardial effusion. Mitral Valve: The mitral valve is normal in structure. Moderate mitral annular calcification. Trivial mitral valve regurgitation. No evidence of mitral valve stenosis. Tricuspid Valve: The tricuspid valve is normal in structure. Tricuspid valve regurgitation is not demonstrated. No evidence of tricuspid stenosis. Aortic Valve: The aortic valve has an indeterminant number of cusps. Aortic valve regurgitation is mild. Aortic regurgitation PHT measures 682 msec. No aortic stenosis is present. Pulmonic Valve: The pulmonic valve was normal in structure. Pulmonic valve regurgitation is not visualized. No evidence of pulmonic stenosis. Aorta: Aortic dilatation noted. There is mild dilatation of the aortic root, measuring 40 mm. Venous: The inferior vena cava is dilated in size with less than 50% respiratory variability, suggesting right atrial pressure of 15 mmHg. IAS/Shunts: No atrial level shunt detected by color flow Doppler.  LEFT  VENTRICLE PLAX 2D LVIDd:         4.60 cm   Diastology LVIDs:         3.20 cm   LV e' medial:    3.70 cm/s LV PW:         0.90 cm   LV E/e' medial:  34.1 LV IVS:        0.70 cm   LV e' lateral:   4.24 cm/s LVOT diam:     2.20 cm   LV E/e' lateral: 29.7 LV SV:         60 LV SV Index:   32 LVOT Area:     3.80 cm  RIGHT VENTRICLE RV S prime:     5.11 cm/s TAPSE (M-mode): 1.4 cm LEFT ATRIUM              Index        RIGHT ATRIUM           Index LA diam:        5.00 cm  2.65 cm/m   RA Area:     20.00 cm LA Vol (A2C):   154.0 ml 81.72 ml/m  RA Volume:   50.60 ml  26.85 ml/m LA Vol (A4C):   124.0 ml 65.80 ml/m LA Biplane Vol: 140.0 ml 74.29 ml/m  AORTIC VALVE LVOT Vmax:   82.00 cm/s LVOT Vmean:  56.300 cm/s LVOT VTI:    0.158 m AI PHT:      682 msec  AORTA Ao Root diam: 4.00 cm Ao Asc diam:  3.40 cm MITRAL VALVE  TRICUSPID VALVE MV Area (PHT): 3.99 cm     TR Peak grad:   2.1 mmHg MV Decel Time: 190 msec     TR Vmax:        71.70 cm/s MR Peak grad: 28.7 mmHg MR Vmax:      268.00 cm/s   SHUNTS MV E velocity: 126.00 cm/s  Systemic VTI:  0.16 m                             Systemic Diam: 2.20 cm Olga Millers MD Electronically signed by Olga Millers MD Signature Date/Time: 01/14/2023/10:12:28 AM    Final    CT CHEST WO CONTRAST  Result Date: 01/13/2023 CLINICAL DATA:  Respiratory illness with nondiagnostic x-ray. History of endometrial cancer. EXAM: CT CHEST, ABDOMEN AND PELVIS WITHOUT CONTRAST TECHNIQUE: Multidetector CT imaging of the chest, abdomen and pelvis was performed following the standard protocol without IV contrast. RADIATION DOSE REDUCTION: This exam was performed according to the departmental dose-optimization program which includes automated exposure control, adjustment of the mA and/or kV according to patient size and/or use of iterative reconstruction technique. COMPARISON:  Chest radiograph 01/12/2023 and 11/26/2022. CT chest 09/01/2022 FINDINGS: CT CHEST FINDINGS Cardiovascular:  Mild cardiac enlargement. Calcification in the aortic and mitral valves. No pericardial effusions. Normal caliber thoracic aorta. Calcification of the aorta and coronary arteries. Mediastinum/Nodes: Esophagus is decompressed. Residual contrast material is demonstrated in the esophagus, possibly representing reflux or dysmotility. No significant lymphadenopathy. Thyroid gland is unremarkable. Lungs/Pleura: Patchy ground-glass infiltrates throughout both lungs in a mostly perihilar distribution. This could represent edema, multifocal pneumonia, or air trapping. No pleural effusions. No pneumothorax. Musculoskeletal: Postoperative right shoulder arthroplasty. Old rib fractures. Degenerative changes in the spine. Old appearing endplate compression deformities at T9, T11, and T12. No destructive bone lesions. CT ABDOMEN PELVIS FINDINGS Hepatobiliary: No focal liver abnormality is seen. No gallstones, gallbladder wall thickening, or biliary dilatation. Pancreas: Unremarkable. No pancreatic ductal dilatation or surrounding inflammatory changes. Spleen: Normal in size without focal abnormality. Adrenals/Urinary Tract: No adrenal gland nodules. Mild parenchymal atrophy in the kidneys. No hydronephrosis or hydroureter. No renal, ureteral, or bladder stones. Bladder is normal. Stomach/Bowel: Stomach, small bowel, and colon are not abnormally distended. Contrast material flows through to the cecum without evidence of bowel obstruction. No wall thickening or inflammatory changes are appreciated. There is a duodenal diverticulum. The appendix is normal. Vascular/Lymphatic: Aortic atherosclerosis. No enlarged abdominal or pelvic lymph nodes. Reproductive: Uterus and bilateral adnexa are unremarkable. Other: No abdominal wall hernia or abnormality. No abdominopelvic ascites. Musculoskeletal: Mild endplate compression of L2 appears chronic. Degenerative changes in the spine. No destructive bone lesions. IMPRESSION: 1. Perihilar  ground-glass infiltrates in the lungs likely representing edema or multifocal pneumonia. 2. No evidence of metastatic disease seen in the chest, abdomen, or pelvis on noncontrast imaging. 3. Aortic atherosclerosis 4. Multiple chronic appearing endplate compression deformities in the spine likely indicate osteoporosis. Electronically Signed   By: Burman Nieves M.D.   On: 01/13/2023 17:02   CT ABDOMEN PELVIS WO CONTRAST  Result Date: 01/13/2023 CLINICAL DATA:  Respiratory illness with nondiagnostic x-ray. History of endometrial cancer. EXAM: CT CHEST, ABDOMEN AND PELVIS WITHOUT CONTRAST TECHNIQUE: Multidetector CT imaging of the chest, abdomen and pelvis was performed following the standard protocol without IV contrast. RADIATION DOSE REDUCTION: This exam was performed according to the departmental dose-optimization program which includes automated exposure control, adjustment of the mA and/or kV according to patient size and/or  use of iterative reconstruction technique. COMPARISON:  Chest radiograph 01/12/2023 and 11/26/2022. CT chest 09/01/2022 FINDINGS: CT CHEST FINDINGS Cardiovascular: Mild cardiac enlargement. Calcification in the aortic and mitral valves. No pericardial effusions. Normal caliber thoracic aorta. Calcification of the aorta and coronary arteries. Mediastinum/Nodes: Esophagus is decompressed. Residual contrast material is demonstrated in the esophagus, possibly representing reflux or dysmotility. No significant lymphadenopathy. Thyroid gland is unremarkable. Lungs/Pleura: Patchy ground-glass infiltrates throughout both lungs in a mostly perihilar distribution. This could represent edema, multifocal pneumonia, or air trapping. No pleural effusions. No pneumothorax. Musculoskeletal: Postoperative right shoulder arthroplasty. Old rib fractures. Degenerative changes in the spine. Old appearing endplate compression deformities at T9, T11, and T12. No destructive bone lesions. CT ABDOMEN PELVIS  FINDINGS Hepatobiliary: No focal liver abnormality is seen. No gallstones, gallbladder wall thickening, or biliary dilatation. Pancreas: Unremarkable. No pancreatic ductal dilatation or surrounding inflammatory changes. Spleen: Normal in size without focal abnormality. Adrenals/Urinary Tract: No adrenal gland nodules. Mild parenchymal atrophy in the kidneys. No hydronephrosis or hydroureter. No renal, ureteral, or bladder stones. Bladder is normal. Stomach/Bowel: Stomach, small bowel, and colon are not abnormally distended. Contrast material flows through to the cecum without evidence of bowel obstruction. No wall thickening or inflammatory changes are appreciated. There is a duodenal diverticulum. The appendix is normal. Vascular/Lymphatic: Aortic atherosclerosis. No enlarged abdominal or pelvic lymph nodes. Reproductive: Uterus and bilateral adnexa are unremarkable. Other: No abdominal wall hernia or abnormality. No abdominopelvic ascites. Musculoskeletal: Mild endplate compression of L2 appears chronic. Degenerative changes in the spine. No destructive bone lesions. IMPRESSION: 1. Perihilar ground-glass infiltrates in the lungs likely representing edema or multifocal pneumonia. 2. No evidence of metastatic disease seen in the chest, abdomen, or pelvis on noncontrast imaging. 3. Aortic atherosclerosis 4. Multiple chronic appearing endplate compression deformities in the spine likely indicate osteoporosis. Electronically Signed   By: Burman Nieves M.D.   On: 01/13/2023 17:02   DG Chest Port 1 View  Result Date: 01/12/2023 CLINICAL DATA:  shob EXAM: PORTABLE CHEST 1 VIEW COMPARISON:  CXR 11/26/22 FINDINGS: Pleural effusion. No pneumothorax. Unchanged cardiac and mediastinal contours. Compared to prior exam there are new hazy opacities in the bilateral mid lung fields, which could represent pulmonary edema or multifocal infection. No radiographically apparent displaced rib fractures. Visualized upper abdomen  unremarkable. Right shoulder arthroplasty. IMPRESSION: Compared to prior exam there are new hazy opacities in the bilateral mid lung fields, which could represent pulmonary edema or multifocal infection. Electronically Signed   By: Lorenza [place] M.D.   On: 01/12/2023 14:36    Microbiology: Results for orders placed or performed during the hospital encounter of 01/28/23  Resp panel by RT-PCR (RSV, Flu A&B, Covid) Anterior Nasal Swab     Status: None   Collection Time: 01/28/23 10:12 AM   Specimen: Anterior Nasal Swab  Result Value Ref Range Status   SARS Coronavirus 2 by RT PCR NEGATIVE NEGATIVE Final    Comment: (NOTE) SARS-CoV-2 target nucleic acids are NOT DETECTED.  The SARS-CoV-2 RNA is generally detectable in upper respiratory specimens during the acute phase of infection. The lowest concentration of SARS-CoV-2 viral copies this assay can detect is 138 copies/mL. A negative result does not preclude SARS-Cov-2 infection and should not be used as the sole basis for treatment or other patient management decisions. A negative result may occur with  improper specimen collection/handling, submission of specimen other than nasopharyngeal swab, presence of viral mutation(s) within the areas targeted by this assay, and inadequate number of viral copies(<138  copies/mL). A negative result must be combined with clinical observations, patient history, and epidemiological information. The expected result is Negative.  Fact Sheet for Patients:  BloggerCourse.com  Fact Sheet for Healthcare Providers:  SeriousBroker.it  This test is no t yet approved or cleared by the [place] FDA and  has been authorized for detection and/or diagnosis of SARS-CoV-2 by FDA under an Emergency Use Authorization (EUA). This EUA will remain  in effect (meaning this test can be used) for the duration of the COVID-19 declaration under Section 564(b)(1) of the  Act, 21 U.S.C.section 360bbb-3(b)(1), unless the authorization is terminated  or revoked sooner.       Influenza A by PCR NEGATIVE NEGATIVE Final   Influenza B by PCR NEGATIVE NEGATIVE Final    Comment: (NOTE) The Xpert Xpress SARS-CoV-2/FLU/RSV plus assay is intended as an aid in the diagnosis of influenza from Nasopharyngeal swab specimens and should not be used as a sole basis for treatment. Nasal washings and aspirates are unacceptable for Xpert Xpress SARS-CoV-2/FLU/RSV testing.  Fact Sheet for Patients: BloggerCourse.com  Fact Sheet for Healthcare Providers: SeriousBroker.it  This test is not yet approved or cleared by the [place] FDA and has been authorized for detection and/or diagnosis of SARS-CoV-2 by FDA under an Emergency Use Authorization (EUA). This EUA will remain in effect (meaning this test can be used) for the duration of the COVID-19 declaration under Section 564(b)(1) of the Act, 21 U.S.C. section 360bbb-3(b)(1), unless the authorization is terminated or revoked.     Resp Syncytial Virus by PCR NEGATIVE NEGATIVE Final    Comment: (NOTE) Fact Sheet for Patients: BloggerCourse.com  Fact Sheet for Healthcare Providers: SeriousBroker.it  This test is not yet approved or cleared by the [place] FDA and has been authorized for detection and/or diagnosis of SARS-CoV-2 by FDA under an Emergency Use Authorization (EUA). This EUA will remain in effect (meaning this test can be used) for the duration of the COVID-19 declaration under Section 564(b)(1) of the Act, 21 U.S.C. section 360bbb-3(b)(1), unless the authorization is terminated or revoked.  Performed at [facility], [address] [place], [place] [address]    *Note: Due to a large number of results and/or encounters for the requested time period, some results have not been  displayed. A complete set of results can be found in Results Review.    Labs: CBC: Recent Labs  Lab 01/28/23 1025  WBC 10.7*  HGB 11.8*  HCT 38.3  MCV 90.5  PLT 217   Basic Metabolic Panel: Recent Labs  Lab 01/28/23 1025 01/28/23 1730 01/29/23 0356 01/30/23 0639  NA 132*  --  135 130*  K 5.4* 4.5 4.3 4.0  CL 99  --  99 95*  CO2 26  --  29 25  GLUCOSE 383*  --  233* 163*  BUN 18  --  17 17  CREATININE 0.88  --  0.91 0.75  CALCIUM 9.0  --  8.5* 8.4*    CBG: Recent Labs  Lab 01/29/23 1126 01/29/23 1620 01/29/23 2058 01/30/23 0819 01/30/23 1130  GLUCAP 281* 220* 208* 199* 253*    Discharge time spent: 35 minutes.  Signed: Jacquelin Hawking, MD [facility] 01/30/2023

## 2023-01-30 NOTE — Plan of Care (Signed)

## 2023-02-03 ENCOUNTER — Ambulatory Visit: Payer: Medicare Other | Admitting: Family Medicine

## 2023-02-03 ENCOUNTER — Encounter: Payer: Self-pay | Admitting: Psychiatry

## 2023-02-03 ENCOUNTER — Encounter: Payer: Self-pay | Admitting: Family Medicine

## 2023-02-03 ENCOUNTER — Telehealth: Payer: Self-pay

## 2023-02-03 VITALS — BP 124/74 | HR 85 | Temp 97.7°F | Ht 62.0 in | Wt 196.0 lb

## 2023-02-03 DIAGNOSIS — J9621 Acute and chronic respiratory failure with hypoxia: Secondary | ICD-10-CM | POA: Diagnosis not present

## 2023-02-03 DIAGNOSIS — R0602 Shortness of breath: Secondary | ICD-10-CM | POA: Diagnosis not present

## 2023-02-03 DIAGNOSIS — I5031 Acute diastolic (congestive) heart failure: Secondary | ICD-10-CM | POA: Diagnosis not present

## 2023-02-03 NOTE — Telephone Encounter (Signed)
Patient called to ask if she was still supposed to be taking the Aygestin that Dr Alvester Morin prescribed.  I advised will forward the message to Dr Alvester Morin and find out and will her back to let her know.

## 2023-02-03 NOTE — Transitions of Care (Post Inpatient/ED Visit) (Signed)
02/03/2023  Name: Amber Stephenson MRN: [id number] DOB: [birth year]  Today's TOC FU Call Status: Today's TOC FU Call Status:: Successful TOC FU Call Completed TOC FU Call Complete Date: 02/03/23 Patient's Name and Date of Birth confirmed.  Transition Care Management Follow-up Telephone Call Date of Discharge: 01/30/23 Discharge Facility: [facility]) Type of Discharge: Inpatient Admission Primary Inpatient Discharge Diagnosis:: Acute Heart Failure How have you been since you were released from the hospital?: Same (Patient is in a lot of pain from Left foot fracture) Any questions or concerns?: No  Items Reviewed: Did you receive and understand the discharge instructions provided?: Yes Medications obtained,verified, and reconciled?: Yes (Medications Reviewed) Any new allergies since your discharge?: No Dietary orders reviewed?: Yes Type of Diet Ordered:: Carbohydrate modified Do you have support at home?: Yes People in Home: spouse Name of Support/Comfort Primary Source: Dwight  Medications Reviewed Today: Medications Reviewed Today     Reviewed by Jodelle Gross, RN (Case Manager) on 02/03/23 at 1202  Med List Status: <None>   Medication Order Taking? Sig Documenting Provider Last Dose Status Informant  albuterol (VENTOLIN HFA) 108 (90 Base) MCG/ACT inhaler [id number] Yes Inhale 2 puffs into the lungs every 6 (six) hours as needed for wheezing or shortness of breath. Sharee Holster, NP Taking Active Spouse/Significant Other  allopurinol (ZYLOPRIM) 100 MG tablet [id number] Yes Take 100 mg by mouth daily. [provider] Taking Active Spouse/Significant Other  apixaban (ELIQUIS) 5 MG TABS tablet [id number] Yes Take 1 tablet (5 mg total) by mouth 2 (two) times daily. Quintella Reichert, MD Taking Active Spouse/Significant Other  ARIPiprazole (ABILIFY) 2 MG tablet [id number] Yes Take 2 mg by mouth at bedtime. [provider] Taking Active Spouse/Significant Other   atorvastatin (LIPITOR) 40 MG tablet [id number] Yes Take 1 tablet (40 mg total) by mouth daily. Narda Bonds, MD Taking Active Spouse/Significant Other  BELSOMRA 20 MG TABS [id number] Yes Take 20 mg by mouth at bedtime as needed (for sleep). [provider] Taking Active Spouse/Significant Other  Continuous Blood Gluc Sensor (DEXCOM G7 SENSOR) MISC [id number] Yes Change sensor every 10 days  Patient taking differently: Inject 1 Device into the skin See admin instructions. Place 1 new sensor into the skin every 10 days   Shamleffer, Konrad Dolores, MD Taking Active Spouse/Significant Other           Med Note Antony Madura, Melody Comas Jan 28, 2023  8:15 PM) Device was removed today  cyanocobalamin (VITAMIN B12) 1000 MCG tablet [id number] Yes Take 1 tablet (1,000 mcg total) by mouth daily. Ghimire, Werner Lean, MD Taking Active Spouse/Significant Other  denosumab (PROLIA) 60 MG/ML SOSY injection [id number] Yes Inject 60 mg into the skin every 6 (six) months. Mechele Claude, MD Taking Active Spouse/Significant Other  doxepin (SINEQUAN) 10 MG capsule [id number] Yes Take 10 mg by mouth at bedtime. [provider] Taking Active Spouse/Significant Other  escitalopram (LEXAPRO) 10 MG tablet [id number] Yes Take 10 mg by mouth daily. [provider] Taking Active Spouse/Significant Other  fluticasone-salmeterol (ADVAIR DISKUS) 100-50 MCG/ACT AEPB [id number] Yes Inhale 1 puff into the lungs 2 (two) times daily.  Patient taking differently: Inhale 1 puff into the lungs 2 (two) times daily as needed (for flares).   Parrett, Virgel Bouquet, NP Taking Active Spouse/Significant Other           Med Note Antony Madura, Arn Medal   Wed Jan 28, 2023  8:18 PM)    furosemide (LASIX) 20 MG tablet  [id number] Yes Take 20 mg by mouth See admin instructions. Take 20 mg by mouth at 8 AM and 6 PM [provider] Taking Active Spouse/Significant Other  Galcanezumab-gnlm (EMGALITY) 120 MG/ML Ivory Broad [id number] Yes INJECT  CONTENTS ON 1 PEN(120MG ) INTO THE SKIN ONCE MONTHLY  Patient taking differently: Inject 120 mg into the skin every 30 (thirty) days.   Marcos Eke, PA-C Taking Active Spouse/Significant Other  insulin glargine (LANTUS SOLOSTAR) 100 UNIT/ML Solostar Pen [id number] Yes Inject 35 Units into the skin 2 (two) times daily. Mechele Claude, MD Taking Active Spouse/Significant Other  insulin lispro (HUMALOG KWIKPEN) 100 UNIT/ML KwikPen [id number] Yes Max daily 50 units  Patient taking differently: Inject 4 Units into the skin See admin instructions. Inject 4 units into the skin 2 times a day with meals, per sliding scale. Add 1 additional unit for every 30 points above a BGL reading of 160.   Shamleffer, Konrad Dolores, MD Taking Active Spouse/Significant Other  Insulin Pen Needle 29G X MISC [id number] Yes 1 Device by Does not apply route daily in the afternoon. Shamleffer, Konrad Dolores, MD Taking Active Spouse/Significant Other  LORazepam (ATIVAN) 0.5 MG tablet [id number] Yes Take 0.5-1 mg by mouth See admin instructions. Take 0.5 mg by mouth in the morning and 1 mg at bedtime [provider] Taking Active Spouse/Significant Other  metFORMIN (GLUCOPHAGE-XR) 500 MG 24 hr tablet [id number] Yes Take 2 tablets (1,000 mg total) by mouth daily with breakfast. Shamleffer, Konrad Dolores, MD Taking Active Spouse/Significant Other  metoprolol tartrate (LOPRESSOR) 50 MG tablet [id number] Yes Take 1 tablet (50 mg total) by mouth 2 (two) times daily. Quintella Reichert, MD Taking Active Spouse/Significant Other  morphine (MS CONTIN) 15 MG 12 hr tablet [id number] Yes Take 1 tablet (15 mg total) by mouth every 12 (twelve) hours. For chronic pain- can refill by 01/13/23 Genice Rouge, MD Taking Active Spouse/Significant Other  naloxone [facility]) nasal spray 4 mg/0.1 mL [id number]  Place 1 spray into the nose once as needed (AS DIRECTED). [provider]  Active Spouse/Significant Other  norethindrone  (AYGESTIN) 5 MG tablet [id number]  Take 2 tablets (10 mg total) by mouth daily. [name], DO  Expired 01/29/23 2359 Spouse/Significant Other           Med Note Antony Madura, KATHY N   Wed Jan 28, 2023  7:11 PM)    nystatin (MYCOSTATIN/NYSTOP) powder [id number]  Apply 1 Application topically as needed (rash). Shamleffer, Konrad Dolores, MD  Active Spouse/Significant Other  nystatin ointment (MYCOSTATIN) [id number] Yes Apply 1 Application topically 2 (two) times daily.  Patient taking differently: Apply 1 Application topically 2 (two) times daily as needed (for rashes).   Adline Potter, NP Taking Active Spouse/Significant Other           Med Note (BRIDGES, JACQUELINE L   Tue Sep 09, 2022  1:09 PM)    ondansetron (ZOFRAN-ODT) 8 MG disintegrating tablet [id number] No Take 1 tablet (8 mg total) by mouth every 6 (six) hours as needed for nausea or vomiting. Mechele Claude, MD Unknown Active Spouse/Significant Other           Med Note Henreitta Leber, JACQUELINE L   Tue Sep 09, 2022  1:09 PM)    OXcarbazepine (TRILEPTAL) 150 MG tablet [id number] Yes Take 1 tablet (150 mg total) by mouth 2 (two) times daily. Marguerita Merles Hardin, DO Taking Active Spouse/Significant Other  pantoprazole (PROTONIX) 40 MG tablet [id number] Yes Take 40 mg by mouth daily as needed (for reflux). [provider] Taking Active Spouse/Significant Other  pregabalin (LYRICA) 200 MG capsule [id number] Yes Take 200 mg by mouth 2 (two) times daily. [provider] Taking Active Spouse/Significant Other           Med Note Antony Madura, Arn Medal   Wed Jan 28, 2023  8:22 PM)    senna-docusate (SENOKOT-S) 8.6-50 MG tablet [id number] Yes Take 1 tablet by mouth at bedtime.  Patient taking differently: Take 1 tablet by mouth at bedtime as needed (for constipation).   Marguerita Merles Austin, DO Taking Active Spouse/Significant Other  spironolactone (ALDACTONE) 25 MG tablet [id number] Yes Take 0.5 tablets (12.5 mg total) by mouth daily. Narda Bonds, MD Taking Active   tirzepatide [facility]) 5 MG/0.5ML Pen [id number] Yes Inject 5 mg into the skin once a week.  Patient taking differently: Inject 5 mg into the skin every Sunday.   Shamleffer, Konrad Dolores, MD Taking Active Spouse/Significant Other  TYLENOL 500 MG tablet [id number] Yes Take 500-1,000 mg by mouth every 6 (six) hours as needed for mild pain or headache. [provider] Taking Active Spouse/Significant Other  Vitamin D, Cholecalciferol, 25 MCG (1000 UT) TABS [id number] Yes Take 1,000 Units by mouth daily in the afternoon. [provider] Taking Active Spouse/Significant Other  Med List Note Benjamine Mola 08/29/22 1018): [employer] insurance             Home Care and Equipment/Supplies: Were Home Health Services Ordered?: Yes [facility] of [facility] Agency:: [facility] Has Agency set up a time to come to your home?: Yes First Home Health Visit Date: 01/23/23 (They will be restarting this week) Any new equipment or medical supplies ordered?: No  Functional Questionnaire: Do you need assistance with bathing/showering or dressing?: Yes (due to foot injury) Do you need assistance with meal preparation?: Yes Do you need assistance with eating?: No Do you have difficulty maintaining continence: No Do you need assistance with getting out of bed/getting out of a chair/moving?: Yes Do you have difficulty managing or taking your medications?: No  Follow up appointments reviewed: PCP Follow-up appointment confirmed?: Yes Date of PCP follow-up appointment?: 02/03/23 Follow-up Provider: Dr. Darlyn Read Specialist [facility] Follow-up appointment confirmed?: Yes Date of Specialist follow-up appointment?: 02/05/23 Follow-Up Specialty Provider:: Dr. Eulah Pont (ortho) Do you need transportation to your follow-up appointment?: No Do you understand care options if your condition(s) worsen?: Yes-patient verbalized understanding  SDOH Interventions Today    Flowsheet  Row Most Recent Value  SDOH Interventions   Food Insecurity Interventions Intervention Not Indicated  Transportation Interventions Intervention Not Indicated  Utilities Interventions Intervention Not Indicated     Jodelle Gross RN, BSN, CCM [facility] RN Care Coordinator/ Transitions of Care Direct Dial: [phone]  Fax: [phone]

## 2023-02-03 NOTE — Progress Notes (Signed)
Subjective:  Patient ID: Amber Stephenson, female    DOB: Mar 15, 1946  Age: 77 y.o. MRN: [id number]  CC: Hospitalization Follow-up   HPI KAHMIYAH FRANCE presents for hospitalization from 8/28 to 8/29 due to dyspnea. Was found to be in heart failure. Diuresed with resolution of the dyspnea, but now on 2 L N/C O2. Amber Stephenson was stared on spironolactone. Amber Stephenson was found to have pseudohyponatremia due to hyperglycemia. A1c found to be 11+. Pt. Tells me Dr. Lonzo Cloud, her diabetologist wants her to increase Mounjaro, but Amber Stephenson didn't because Amber Stephenson has a lot of the current 5 mg dose.  Pt. Broke her foot, is wearing a post op shoe. Has appt. Scheduled this week with orthopedist. Hospital summary by Dr. Caleb Popp reviewed.     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), Benign paroxysmal positional vertigo due to bilateral vestibular disorder (05/20/2019), Bipolar 1 disorder (HCC) (01/23/2015), BPPV (benign paroxysmal positional vertigo) (05/20/2019), CAD (coronary artery disease), Cardiomegaly (01/12/2018), CHF (congestive heart failure) (06/08/2022), Chronic back pain (01/04/2015), Chronic constipation (04/25/2020), Chronic diastolic heart failure (HCC) (63/06/6008), Chronic pain syndrome (08/22/2019), Chronic pain syndrome (08/22/2019), Chronic post-traumatic stress disorder (PTSD) (12/06/2020), Diabetic neuropathy (HCC) (02/06/2016), Dyslipidemia (09/24/2020), Dyspnea on exertion, Essential hypertension, Family history of  coronary arteriosclerosis (05/30/2015), Functional diarrhea (10/26/2020), Head trauma (09/17/2020), Hemorrhoids (03/11/2022), Herpes genitalis in women (07/16/2015), History of adenomatous polyp of colon (05/21/2019), Insomnia (01/23/2015), Lipoma (02/08/2015), Migraine headache with aura (02/12/2016), Myofascial pain dysfunction syndrome (08/22/2019), Myofascial pain dysfunction syndrome (08/22/2019), Non-alcoholic fatty liver disease (93/23/5573), Opioid dependence (HCC) (12/06/2020), OSA (obstructive sleep apnea) (02/24/2019), Osteopenia (12/06/2020), Pinguecula (12/06/2020), Postcoital bleeding (12/06/2020), Presbyopia (12/06/2020), Pulmonary hypertension, RLS (restless legs syndrome) (04/27/2015), Superficial fungus infection of skin (09/03/2021), Tremor, essential (12/11/2021), and Type II diabetes mellitus (HCC) (09/24/2020).   Amber Stephenson has a past surgical history that includes THIGH SURGERY; Shoulder surgery (Right); Breast reduction surgery; Eye surgery (Right); Hammer toe surgery; LEFT HEART CATH AND CORONARY ANGIOGRAPHY (N/A, 02/03/2018); and Reverse shoulder arthroplasty (Right, 08/19/2019).   Her family history includes Alcohol abuse in her sister; Alzheimer's disease in her father; Diabetes in her brother, mother, and sister; Heart disease in her brother, mother, and sister; Mental illness in her brother; Stroke in her brother.Amber Stephenson reports that Amber Stephenson has never smoked. Amber Stephenson has never used smokeless tobacco. Amber Stephenson reports that Amber Stephenson does not currently use alcohol. Amber Stephenson reports that Amber Stephenson does not use drugs.    ROS Review of Systems  Constitutional:  Positive for activity change and fatigue. Negative for appetite change and diaphoresis.  Respiratory:  Positive for shortness of breath (at baseline, mild.). Negative for chest tightness.   Cardiovascular:  Negative for chest pain.  Gastrointestinal:  Negative for abdominal pain.  Genitourinary:  Negative for difficulty urinating.  Neurological:  Positive for  weakness (nonfocal).    Objective:  BP 124/74   Pulse 85   Temp 97.7 F (36.5 C)   Ht 5\' 2"  (1.575 m)   Wt 196 lb (88.9 kg)   SpO2 93%   BMI  35.85 kg/m   BP Readings from Last 3 Encounters:  02/03/23 124/74  01/30/23 124/89  01/26/23 123/69    Wt Readings from Last 3 Encounters:  02/03/23 196 lb (88.9 kg)  01/30/23 196 lb 3.4 oz (89 kg)  01/26/23 196 lb 12.8 oz (89.3 kg)     Physical Exam Constitutional:      General: Amber Stephenson is not in acute distress.    Appearance: Amber Stephenson is well-developed.  Cardiovascular:     Rate and Rhythm: Normal rate and regular rhythm.  Pulmonary:     Breath sounds: Normal breath sounds.  Musculoskeletal:        General: Signs of injury (left foot in post op shoe) present. No swelling.     Comments: In WC   Skin:    General: Skin is warm and dry.  Neurological:     Mental Status: Amber Stephenson is alert and oriented to person, place, and time.       Assessment & Plan:   Amber Stephenson was seen today for hospitalization follow-up.  Diagnoses and all orders for this visit:  Acute on chronic respiratory failure with hypoxia (HCC) -     CMP14+EGFR -     Brain natriuretic peptide  Acute diastolic heart failure (HCC) -     CMP14+EGFR -     Brain natriuretic peptide  Shortness of breath -     CMP14+EGFR -     Brain natriuretic peptide  CHF & respiratory failure are stable currently. Meds reviewed. Will need increase in [place]. Call Dr. Lonzo Cloud for that. Will need ortho appt. That has been arranged.      I am having Amber Stephenson. [place] maintain her Vitamin D (Cholecalciferol), albuterol, Prolia, naloxone, ARIPiprazole, nystatin ointment, ondansetron, Dexcom G7 Sensor, insulin lispro, Insulin Pen Needle, senna-docusate, OXcarbazepine, nystatin, cyanocobalamin, apixaban, fluticasone-salmeterol, allopurinol, Mounjaro, doxepin, metFORMIN, morphine, Lantus SoloStar, norethindrone, metoprolol tartrate, Belsomra, pregabalin, escitalopram, atorvastatin,  Emgality, TYLENOL, furosemide, LORazepam, pantoprazole, and spironolactone.  Allergies as of 02/03/2023       Reactions   Iodine Anaphylaxis   Ivp Dye [iodinated Contrast Media] Anaphylaxis, Swelling, Other (See Comments)   Throat closes   Latex Other (See Comments)   Latex tape pulls skin with it   Tape Other (See Comments)   Pulls off the skin, if latex   Tizanidine Other (See Comments)   Weakness, goofy, bad dreams        Medication List        Accurate as of February 03, 2023  3:36 PM. If you have any questions, ask your nurse or doctor.          albuterol 108 (90 Base) MCG/ACT inhaler Commonly known as: VENTOLIN HFA Inhale 2 puffs into the lungs every 6 (six) hours as needed for wheezing or shortness of breath.   allopurinol 100 MG tablet Commonly known as: ZYLOPRIM Take 100 mg by mouth daily.   apixaban 5 MG Tabs tablet Commonly known as: Eliquis Take 1 tablet (5 mg total) by mouth 2 (two) times daily.   ARIPiprazole 2 MG tablet Commonly known as: ABILIFY Take 2 mg by mouth at bedtime.   atorvastatin 40 MG tablet Commonly known as: LIPITOR Take 1 tablet (40 mg total) by mouth daily.   Belsomra 20 MG Tabs Generic drug: Suvorexant Take 20 mg by mouth at bedtime as needed (for sleep).   cyanocobalamin 1000 MCG tablet Commonly known as: VITAMIN B12 Take 1 tablet (1,000 mcg total) by mouth daily.   Dexcom G7 Sensor Misc  Change sensor every 10 days What changed:  how much to take how to take this when to take this additional instructions   doxepin 10 MG capsule Commonly known as: SINEQUAN Take 10 mg by mouth at bedtime.   Emgality 120 MG/ML Soaj Generic drug: Galcanezumab-gnlm INJECT CONTENTS ON 1 PEN(120MG ) INTO THE SKIN ONCE MONTHLY What changed:  how much to take how to take this when to take this additional instructions   escitalopram 10 MG tablet Commonly known as: LEXAPRO Take 10 mg by mouth daily.   fluticasone-salmeterol 100-50  MCG/ACT Aepb Commonly known as: Advair Diskus Inhale 1 puff into the lungs 2 (two) times daily. What changed:  when to take this reasons to take this   furosemide 20 MG tablet Commonly known as: LASIX Take 20 mg by mouth See admin instructions. Take 20 mg by mouth at 8 AM and 6 PM   insulin lispro 100 UNIT/ML KwikPen Commonly known as: HumaLOG KwikPen Max daily 50 units What changed:  how much to take how to take this when to take this additional instructions   Insulin Pen Needle 29G X Misc 1 Device by Does not apply route daily in the afternoon.   Lantus SoloStar 100 UNIT/ML Solostar Pen Generic drug: insulin glargine Inject 35 Units into the skin 2 (two) times daily.   LORazepam 0.5 MG tablet Commonly known as: ATIVAN Take 0.5-1 mg by mouth See admin instructions. Take 0.5 mg by mouth in the morning and 1 mg at bedtime   metFORMIN 500 MG 24 hr tablet Commonly known as: GLUCOPHAGE-XR Take 2 tablets (1,000 mg total) by mouth daily with breakfast.   metoprolol tartrate 50 MG tablet Commonly known as: LOPRESSOR Take 1 tablet (50 mg total) by mouth 2 (two) times daily.   morphine 15 MG 12 hr tablet Commonly known as: MS Contin Take 1 tablet (15 mg total) by mouth every 12 (twelve) hours. For chronic pain- can refill by 01/13/23   Mounjaro 5 MG/0.5ML Pen Generic drug: tirzepatide Inject 5 mg into the skin once a week. What changed: when to take this   naloxone 4 MG/0.1ML Liqd nasal spray kit Commonly known as: NARCAN Place 1 spray into the nose once as needed (AS DIRECTED).   norethindrone 5 MG tablet Commonly known as: AYGESTIN Take 2 tablets (10 mg total) by mouth daily.   nystatin ointment Commonly known as: MYCOSTATIN Apply 1 Application topically 2 (two) times daily. What changed:  when to take this reasons to take this   nystatin powder Commonly known as: MYCOSTATIN/NYSTOP Apply 1 Application topically as needed (rash). What changed: Another  medication with the same name was changed. Make sure you understand how and when to take each.   ondansetron 8 MG disintegrating tablet Commonly known as: ZOFRAN-ODT Take 1 tablet (8 mg total) by mouth every 6 (six) hours as needed for nausea or vomiting.   OXcarbazepine 150 MG tablet Commonly known as: TRILEPTAL Take 1 tablet (150 mg total) by mouth 2 (two) times daily.   pantoprazole 40 MG tablet Commonly known as: PROTONIX Take 40 mg by mouth daily as needed (for reflux).   pregabalin 200 MG capsule Commonly known as: LYRICA Take 200 mg by mouth 2 (two) times daily.   Prolia 60 MG/ML Sosy injection Generic drug: denosumab Inject 60 mg into the skin every 6 (six) months.   senna-docusate 8.6-50 MG tablet Commonly known as: Senokot-S Take 1 tablet by mouth at bedtime. What changed:  when to take  this reasons to take this   spironolactone 25 MG tablet Commonly known as: ALDACTONE Take 0.5 tablets (12.5 mg total) by mouth daily.   TYLENOL 500 MG tablet Generic drug: acetaminophen Take 500-1,000 mg by mouth every 6 (six) hours as needed for mild pain or headache.   Vitamin D (Cholecalciferol) 25 MCG (1000 UT) Tabs Take 1,000 Units by mouth daily in the afternoon.         Follow-up: Return if symptoms worsen or fail to improve.  Mechele Claude, M.D.

## 2023-02-04 ENCOUNTER — Ambulatory Visit: Payer: Medicare Other | Admitting: Pulmonary Disease

## 2023-02-04 ENCOUNTER — Telehealth: Payer: Self-pay | Admitting: *Deleted

## 2023-02-04 ENCOUNTER — Encounter: Payer: Self-pay | Admitting: Pulmonary Disease

## 2023-02-04 VITALS — BP 104/62 | HR 76 | Temp 97.4°F | Ht 62.0 in | Wt 190.0 lb

## 2023-02-04 DIAGNOSIS — J9611 Chronic respiratory failure with hypoxia: Secondary | ICD-10-CM

## 2023-02-04 DIAGNOSIS — G4733 Obstructive sleep apnea (adult) (pediatric): Secondary | ICD-10-CM

## 2023-02-04 DIAGNOSIS — I5032 Chronic diastolic (congestive) heart failure: Secondary | ICD-10-CM

## 2023-02-04 LAB — CMP14+EGFR
ALT: 9 IU/L (ref 0–32)
AST: 18 IU/L (ref 0–40)
Albumin: 3.8 g/dL (ref 3.8–4.8)
Alkaline Phosphatase: 79 IU/L (ref 44–121)
BUN/Creatinine Ratio: 14 (ref 12–28)
BUN: 11 mg/dL (ref 8–27)
Bilirubin Total: 0.5 mg/dL (ref 0.0–1.2)
CO2: 18 mmol/L — ABNORMAL LOW (ref 20–29)
Calcium: 8.8 mg/dL (ref 8.7–10.3)
Chloride: 98 mmol/L (ref 96–106)
Creatinine, Ser: 0.79 mg/dL (ref 0.57–1.00)
Globulin, Total: 2.7 g/dL (ref 1.5–4.5)
Glucose: 421 mg/dL (ref 70–99)
Potassium: 5.6 mmol/L — ABNORMAL HIGH (ref 3.5–5.2)
Sodium: 133 mmol/L — ABNORMAL LOW (ref 134–144)
Total Protein: 6.5 g/dL (ref 6.0–8.5)
eGFR: 78 mL/min/{1.73_m2} (ref >59)

## 2023-02-04 LAB — BRAIN NATRIURETIC PEPTIDE: BNP: 192.8 pg/mL — ABNORMAL HIGH (ref 0.0–100.0)

## 2023-02-04 NOTE — Addendum Note (Signed)
Addended by: Lanna Poche on: 02/04/2023 04:32 PM   Modules accepted: Orders

## 2023-02-04 NOTE — Telephone Encounter (Signed)
OV notes and clearance form have been faxed back to [facility]. Nothing further needed at this time.

## 2023-02-04 NOTE — Progress Notes (Signed)
Amber Stephenson    [id number]    [birth year]  Primary Care Physician:Stacks, Broadus John, MD  Referring Physician: Mechele Claude, MD [address] [place],  [place] [address]  Chief complaint:   Patient being seen for preop evaluation  HPI:  Shortness of breath for which she uses albuterol as needed  History of sleep apnea for which she tried CPAP in 2020 She noted she was a little bit claustrophobic and could not tolerated Was placed on oxygen supplementation at night following that  Was recently hospitalized treated for pulmonary edema for which she is on water pills at present  Sleep study performed in April 2024 does reveal moderate obstructive sleep apnea, previous sleep study was significant for severe obstructive sleep apnea  She is short of breath with activity This appears to be at baseline  She does have an underlying history of asthma, history of diastolic congestive heart failure, atrial fibrillation, pulmonary hypertension    Pets: Occupation: Exposures: Smoking history: Travel history: Relevant family history:  Outpatient Encounter Medications as of 02/04/2023  Medication Sig   albuterol (VENTOLIN HFA) 108 (90 Base) MCG/ACT inhaler Inhale 2 puffs into the lungs every 6 (six) hours as needed for wheezing or shortness of breath.   allopurinol (ZYLOPRIM) 100 MG tablet Take 100 mg by mouth daily.   apixaban (ELIQUIS) 5 MG TABS tablet Take 1 tablet (5 mg total) by mouth 2 (two) times daily.   ARIPiprazole (ABILIFY) 2 MG tablet Take 2 mg by mouth at bedtime.   atorvastatin (LIPITOR) 40 MG tablet Take 1 tablet (40 mg total) by mouth daily.   BELSOMRA 20 MG TABS Take 20 mg by mouth at bedtime as needed (for sleep).   Continuous Blood Gluc Sensor (DEXCOM G7 SENSOR) MISC Change sensor every 10 days (Patient taking differently: Inject 1 Device into the skin See admin instructions. Place 1 new sensor into the skin every 10 days)   cyanocobalamin (VITAMIN B12) 1000  MCG tablet Take 1 tablet (1,000 mcg total) by mouth daily.   denosumab (PROLIA) 60 MG/ML SOSY injection Inject 60 mg into the skin every 6 (six) months.   doxepin (SINEQUAN) 10 MG capsule Take 10 mg by mouth at bedtime.   escitalopram (LEXAPRO) 10 MG tablet Take 10 mg by mouth daily.   fluticasone-salmeterol (ADVAIR DISKUS) 100-50 MCG/ACT AEPB Inhale 1 puff into the lungs 2 (two) times daily. (Patient taking differently: Inhale 1 puff into the lungs 2 (two) times daily as needed (for flares).)   furosemide (LASIX) 20 MG tablet Take 20 mg by mouth See admin instructions. Take 20 mg by mouth at 8 AM and 6 PM   Galcanezumab-gnlm (EMGALITY) 120 MG/ML SOAJ INJECT CONTENTS ON 1 PEN(120MG ) INTO THE SKIN ONCE MONTHLY (Patient taking differently: Inject 120 mg into the skin every 30 (thirty) days.)   insulin glargine (LANTUS SOLOSTAR) 100 UNIT/ML Solostar Pen Inject 35 Units into the skin 2 (two) times daily.   insulin lispro (HUMALOG KWIKPEN) 100 UNIT/ML KwikPen Max daily 50 units (Patient taking differently: Inject 4 Units into the skin See admin instructions. Inject 4 units into the skin 2 times a day with meals, per sliding scale. Add 1 additional unit for every 30 points above a BGL reading of 160.)   Insulin Pen Needle 29G X MISC 1 Device by Does not apply route daily in the afternoon.   LORazepam (ATIVAN) 0.5 MG tablet Take 0.5-1 mg by mouth See admin instructions. Take  0.5 mg by mouth in the morning and 1 mg at bedtime   metFORMIN (GLUCOPHAGE-XR) 500 MG 24 hr tablet Take 2 tablets (1,000 mg total) by mouth daily with breakfast.   metoprolol tartrate (LOPRESSOR) 50 MG tablet Take 1 tablet (50 mg total) by mouth 2 (two) times daily.   morphine (MS CONTIN) 15 MG 12 hr tablet Take 1 tablet (15 mg total) by mouth every 12 (twelve) hours. For chronic pain- can refill by 01/13/23   naloxone (NARCAN) nasal spray 4 mg/0.1 mL Place 1 spray into the nose once as needed (AS DIRECTED).   nystatin  (MYCOSTATIN/NYSTOP) powder Apply 1 Application topically as needed (rash).   nystatin ointment (MYCOSTATIN) Apply 1 Application topically 2 (two) times daily. (Patient taking differently: Apply 1 Application topically 2 (two) times daily as needed (for rashes).)   ondansetron (ZOFRAN-ODT) 8 MG disintegrating tablet Take 1 tablet (8 mg total) by mouth every 6 (six) hours as needed for nausea or vomiting.   OXcarbazepine (TRILEPTAL) 150 MG tablet Take 1 tablet (150 mg total) by mouth 2 (two) times daily.   pantoprazole (PROTONIX) 40 MG tablet Take 40 mg by mouth daily as needed (for reflux).   pregabalin (LYRICA) 200 MG capsule Take 200 mg by mouth 2 (two) times daily.   senna-docusate (SENOKOT-S) 8.6-50 MG tablet Take 1 tablet by mouth at bedtime. (Patient taking differently: Take 1 tablet by mouth at bedtime as needed (for constipation).)   spironolactone (ALDACTONE) 25 MG tablet Take 0.5 tablets (12.5 mg total) by mouth daily.   tirzepatide [facility]) 5 MG/0.5ML Pen Inject 5 mg into the skin once a week. (Patient taking differently: Inject 5 mg into the skin every Sunday.)   TYLENOL 500 MG tablet Take 500-1,000 mg by mouth every 6 (six) hours as needed for mild pain or headache.   Vitamin D, Cholecalciferol, 25 MCG (1000 UT) TABS Take 1,000 Units by mouth daily in the afternoon.   norethindrone (AYGESTIN) 5 MG tablet Take 2 tablets (10 mg total) by mouth daily.   Facility-Administered Encounter Medications as of 02/04/2023  Medication   bupivacaine-epinephrine (MARCAINE W/ EPI) 0.5% -1:200000 injection    Allergies as of 02/04/2023 - Review Complete 02/04/2023  Allergen Reaction Noted   Iodine Anaphylaxis 06/28/2003   Ivp dye [iodinated contrast media] Anaphylaxis, Swelling, and Other (See Comments) 12/13/2014   Latex Other (See Comments) 09/29/2018   Tape Other (See Comments) 01/28/2023   Tizanidine Other (See Comments) 03/10/2022    Past Medical History:  Diagnosis Date   Acquired  hammer toe 12/06/2020   Acute metabolic encephalopathy 06/27/2022   Allergic rhinitis 02/24/2019   Ambulatory dysfunction 04/23/2020   Atrial fibrillation with RVR (HCC) 05/19/2017   Back pain/sacroiliitis--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   Benign paroxysmal positional vertigo due to bilateral vestibular disorder 05/20/2019   Bipolar 1 disorder (HCC) 01/23/2015   with GAD, benzo dependence   BPPV (benign paroxysmal positional vertigo) 05/20/2019   CAD (coronary artery disease)    Nonobstructive; Managed by Dr. Purvis Sheffield   Cardiomegaly 01/12/2018   CHF (congestive heart failure) 06/08/2022   Chronic back pain 01/04/2015   Chronic constipation 04/25/2020   Chronic diastolic heart failure (HCC) 05/30/2015   Chronic pain syndrome 08/22/2019   back pain, sacroiliitis   Chronic pain syndrome 08/22/2019   Chronic post-traumatic stress disorder (PTSD) 12/06/2020   Diabetic neuropathy (HCC) 02/06/2016   Dyslipidemia 09/24/2020  Dyspnea on exertion    Essential hypertension    Family history of coronary arteriosclerosis 05/30/2015   Functional diarrhea 10/26/2020   Head trauma 09/17/2020   Hemorrhoids 03/11/2022   Herpes genitalis in women 07/16/2015   History of adenomatous polyp of colon 05/21/2019   Overview:   03/31/17: Colonoscopy: nonadvanced adenoma, microscopic colitis, f/u 5 yrs, [facility]/GAP   Insomnia 01/23/2015   Lipoma 02/08/2015   Migraine headache with aura 02/12/2016   Myofascial pain dysfunction syndrome 08/22/2019   Myofascial pain dysfunction syndrome 08/22/2019   Non-alcoholic fatty liver disease 01/12/2018   Opioid dependence (HCC) 12/06/2020   OSA (obstructive sleep apnea) 02/24/2019   10/09/2018 - HST  - AHI 40.6    Osteopenia 12/06/2020   Rx alendronate 35 mg.   Pinguecula 12/06/2020   Postcoital bleeding 12/06/2020   Presbyopia 12/06/2020   Pulmonary  hypertension    RLS (restless legs syndrome) 04/27/2015   Superficial fungus infection of skin 09/03/2021   Tremor, essential 12/11/2021   Type II diabetes mellitus (HCC) 09/24/2020    Past Surgical History:  Procedure Laterality Date   BREAST REDUCTION SURGERY     EYE SURGERY Right    cateracts   HAMMER TOE SURGERY     LEFT HEART CATH AND CORONARY ANGIOGRAPHY N/A 02/03/2018   Procedure: LEFT HEART CATH AND CORONARY ANGIOGRAPHY;  Surgeon: [name], Peter M, MD;  Location: [facility] INVASIVE CV LAB;  Service: Cardiovascular;  Laterality: N/A;   REVERSE SHOULDER ARTHROPLASTY Right 08/19/2019   Procedure: REVERSE SHOULDER ARTHROPLASTY;  Surgeon: Beverely Low, MD;  Location: [facility] ORS;  Service: Orthopedics;  Laterality: Right;  interscalene block   SHOULDER SURGERY Right    "I BROKE MY SHOUDLER   THIGH SURGERY     "TO REMOVE A TUMOR "    Family History  Problem Relation Age of Onset   Diabetes Mother    Heart disease Mother    Alzheimer's disease Father    Heart disease Sister        CABG   Diabetes Sister    Alcohol abuse Sister    Stroke Brother    Heart disease Brother    Mental illness Brother    Diabetes Brother    Breast cancer Neg Hx    Ovarian cancer Neg Hx    Colon cancer Neg Hx    Endometrial cancer Neg Hx     Social History   Socioeconomic History   Marital status: Married    Spouse name: Financial planner   Number of children: 3   Years of education: 15   Highest education level: Associate degree: academic program  Occupational History   Occupation: Retired    Comment: Chief Financial Officer  Tobacco Use   Smoking status: Never   Smokeless tobacco: Never  Vaping Use   Vaping status: Never Used  Substance and Sexual Activity   Alcohol use: Not Currently    Alcohol/week: 0.0 standard drinks of alcohol   Drug use: No   Sexual activity: Yes    Partners: Male    Birth control/protection: Post-menopausal  Other Topics Concern   Not on file  Social History Narrative   Lives at home  with husband.    They have 3 children who live away - [place], [place], and [place].   She is from [place] and most of her family lives there.   Her husbands's family live nearby   Right handed.   Social Determinants of Health   Financial Resource Strain: Low Risk  (01/23/2023)   Overall  Financial Resource Strain (CARDIA)    Difficulty of Paying Living Expenses: Not hard at all  Food Insecurity: No Food Insecurity (02/03/2023)   Hunger Vital Sign    Worried About Running Out of Food in the Last Year: Never true    Ran Out of Food in the Last Year: Never true  Transportation Needs: No Transportation Needs (02/03/2023)   PRAPARE - Administrator, Civil Service (Medical): No    Lack of Transportation (Non-Medical): No  Physical Activity: Inactive (01/23/2023)   Exercise Vital Sign    Days of Exercise per Week: 0 days    Minutes of Exercise per Session: 0 min  Stress: No Stress Concern Present (01/23/2023)   [employer] of Occupational Health - Occupational Stress Questionnaire    Feeling of Stress : Not at all  Social Connections: Moderately Integrated (01/23/2023)   Social Connection and Isolation Panel [NHANES]    Frequency of Communication with Friends and Family: More than three times a week    Frequency of Social Gatherings with Friends and Family: More than three times a week    Attends Religious Services: More than 4 times per year    Active Member of [employer] or Organizations: No    Attends Banker Meetings: Never    Marital Status: Married  Catering manager Violence: Not At Risk (01/28/2023)   Humiliation, Afraid, Rape, and Kick questionnaire    Fear of Current or Ex-Partner: No    Emotionally Abused: No    Physically Abused: No    Sexually Abused: No    Review of Systems  Constitutional:  Positive for fatigue.  Respiratory:  Positive for apnea and shortness of breath.   Psychiatric/Behavioral:  Positive for sleep disturbance.     Vitals:    02/04/23 1306  BP: 104/62  Pulse: 76  Temp: (!) 97.4 F (36.3 C)  SpO2: 95%     Physical Exam Constitutional:      Appearance: She is obese.  HENT:     Head: Normocephalic.     Nose: No congestion.  Eyes:     Pupils: Pupils are equal, round, and reactive to light.  Cardiovascular:     Rate and Rhythm: Normal rate and regular rhythm.     Heart sounds: No murmur heard.    No friction rub.  Pulmonary:     Effort: No respiratory distress.     Breath sounds: No stridor. No wheezing or rhonchi.  Musculoskeletal:     Cervical back: No rigidity or tenderness.  Neurological:     Mental Status: She is alert.  Psychiatric:        Mood and Affect: Mood normal.      Data Reviewed: Sleep study significant for moderate obstructive sleep apnea  Recent echocardiogram with normal ejection fraction 60-65%, left ventricular diastolic function could not be measured Right ventricular systolic function is normal, normal right ventricular size  Assessment:  History of obstructive sleep apnea -Most recent study shows moderate obstructive sleep apnea -Patient is willing to try CPAP again -Will need DME referral  Nocturnal hypoxemia -Continue oxygen supplementation at night  Encouraged to check oxygen during the day which she has been checking on a regular basis with saturations running about 88 to 93% on most days  History of asthma -Continue inhaler use as tolerated  History of decompensated diastolic heart failure -Appears optimized at present  Deconditioning Graded activities as tolerated  Preop evaluation -She does not have any acutely reversible problems at  the present time -She is cleared for surgery from a pulmonary perspective attendant risk of perioperative respiratory failure, need for positive pressure ventilation perioperatively  Plan/Recommendations: Placed order for auto CPAP Auto CPAP 5-15 with heated humidification  Encouraged to continue using oxygen  supplementation at present time until she gets a CPAP  Perioperative risk of respiratory failure, need for ventilator, need for positive pressure therapy perioperatively  Graded activities as tolerated  Albuterol use as needed  Follow-up in about 3 months   Virl Diamond MD [facility] 02/04/2023, 1:40 PM  CC: Mechele Claude, MD

## 2023-02-04 NOTE — Telephone Encounter (Signed)
[facility] reports critical lab  Glucose - [phone]

## 2023-02-04 NOTE — Patient Instructions (Signed)
DME referral for CPAP therapy  Auto CPAP 5-15 with heated humidification with patient's mask of choice  Continue graded activities as tolerated  Regarding surgery -You are cleared to have surgery, there is always the risk of breathing difficulty around surgery -Continue using her Ventolin -Use your oxygen at night  A medical supply company will be in touch with you with respect to starting you on CPAP therapy  Call us with significant concerns

## 2023-02-04 NOTE — Progress Notes (Addendum)
GYN Location of Tumor / Histology: Endometrial  MR Pelvis W Wo Contrast 01-27-23 IMPRESSION: 3.9 cm endometrial mass, with suspected 50% myometrial invasion, as above. This is considered borderline for FIGO stage IA/1B by MRI, pending histology.   CT ABDOMEN PELVIS WO CONTRAST 01/13/2023  IMPRESSION: 1. Perihilar ground-glass infiltrates in the lungs likely representing edema or multifocal pneumonia. 2. No evidence of metastatic disease seen in the chest, abdomen, or pelvis on noncontrast imaging. 3. Aortic atherosclerosis 4. Multiple chronic appearing endplate compression deformities in the spine likely indicate osteoporosis.   No evidence of metastatic disease in the pelvis.  Amber Stephenson presented with symptoms of: bleeding PMB: This has been an ongoing issue for the past year.  Bleeding seems to be intermittent, last episode about a week ago.  Denies pelvic or abdominal pain.   Recently seen by J.Griffin- treated for vulvar itching with gentian violet.  Biopsies revealed:  12-25-22 FINAL MICROSCOPIC DIAGNOSIS:   A. ENODMETRIUM, BIOPSY:  - Endometrioid carcinoma, FIGO grade 1 (see comment)   COMMENT:   This case was reviewed with Dr. Wenda Low who agrees with the above  diagnosis.   Past/Anticipated interventions by Gyn/Onc surgery, if any:  Amber Hidalgo, DO 12/25/2022  Endometrial Biopsy Procedure Note   Pre-operative Diagnosis: postmenopausal bleeding   Post-operative Diagnosis: same   Assessment & Plan:  1) PMB, Thickened endometrium -Reviewed US findings and discussed potential etiologies -recommendation for EMB- procedure as above -Next step pending results of pathology.   The patient was advised to call for any fever or for prolonged or severe pain or bleeding. She was advised to use OTC analgesics as needed for mild to moderate pain. She was advised to avoid vaginal intercourse for 48 hours or until the bleeding has completely stopped.   Amber Cliff,  MD 01/05/2023  HISTORY OF PRESENT ILLNESS:   Amber Stephenson is a 77 y.o. woman who is seen in consultation at the request of Amber Hidalgo, DO for evaluation of FIGO grade 1 endometrial cancer.   Patient presented to her OB/GYN 12/25/2022 for concern of postmenopausal bleeding.  She reported that the bleeding has been going on for about a year and intermittent.  An ultrasound was performed on 11/25/2022 which showed an endometrial stripe of 8.9 mm.  She underwent an endometrial biopsy which returned with endometrioid carcinoma, FIGO grade 1   Today, patient presents with her husband.  She reports on and off spotting for the past 5 years.  It got heavier in the past year.  She denies bleeding currently.  She was started on Aygestin 10 mg daily about a week ago and the bleeding stopped after initiating that medication.  She otherwise reports about a 10 pound weight loss, abdominal bloating, and early satiety in the past month.  She denies change in bowel or bladder habits.   In terms of her cardiac history, patient does not have any coronary artery stents in place.  She follows with Dr. Armanda Magic and was last seen on 09/09/2022.  Her most recent echo was on 11/10/2022 and showed an LVEF of 65 to 70%.  In regards to her pulmonary history, patient reports that she uses oxygen at home as needed.  When she uses it it is on 2 L.  She reports using it about 4 times a week.  She follows with Rubye Oaks, NP with [facility] and was last seen on 09/01/2022.  In terms of her chronic pain, she sees Dr. Genice Rouge.  She  is on MS Contin 15 mg twice daily.   ASSESSMENT AND PLAN: Amber Stephenson is a 77 y.o. woman with PMHx notable for afib, CAD, CHF, chronic respiratory failure, chronic pain, OSA, T2DM, now with new diagnosis of FIGO grade 1 endometrioid endometrial cancer.   We reviewed the nature of endometrial cancer and its recommended surgical staging, including total hysterectomy, bilateral  salpingo-oophorectomy, and lymph node assessment.  However, given patient's medical comorbidities, at this time not certain that patient is in optimal candidate for surgical management of endometrial cancer.   Also, given that patient has had on and off bleeding for 5 years and has had some systemic symptoms in the past month, it would be important to rule out metastatic disease at this time.  Recommend patient undergo a CT abdomen/pelvis to evaluate for any evidence of metastatic disease.   In lieu of surgical management, we did review that alternative treatments for endometrial cancer could include hormonal management, radiation treatment, or chemotherapy.  In order to determine if patient would be an appropriate candidate for hormonal therapy, recommend pelvic MRI to evaluate for evidence of myometrial invasion.  If CT scan as above shows evidence of metastatic disease, can defer pelvic MRI.   We reviewed the role of progesterone therapy in the treatment of endometrium confined endometrial cancer. We reviewed the 3 most studied options, to include levonorgesterol IUD (54mcg/d), oral medorxyprogesterone acetate 10mg  daily or cyclically 12-14 days per month, or oral megesterol acetate 40-200mg  per day.     She understands that all options have few side effects, most common being infrequent edema, GI disturbances, and thromboembolic events. Local progesterone through IUD may have a stronger effect on the endometrium with less systemic side effects.  Furthermore, studies demonstrate that 80-90% of women will have regression of their hyperplasia with progesterone use.     For now, patient will continue on the Aygestin.  If based on imaging above patient is an appropriate candidate for hormonal therapy alone, we would like to try to proceed with a dilation and curettage and IUD insertion.   We discussed that monitoring would be with an endometrial biopsy every 3 to 6 months following initiation of treatment  until we have achieved 2-3 negative biopsies, after which time sampling frequency could decrease.   In anticipation of possible D&C procedure, we will go ahead and work towards clearance with her pulmonologist and cardiologist.  Past/Anticipated interventions by medical oncology, if any: none in EMR  Weight changes, if any: no Wt Readings from Last 3 Encounters:  02/11/23 191 lb (86.6 kg)  02/05/23 195 lb (88.5 kg)  02/04/23 190 lb (86.2 kg)     Bowel/Bladder complaints, if any: No.,   Nausea/Vomiting, if any: no  Pain issues, if any:  yes, chronic back pain. Rates 7/10  SAFETY ISSUES: Prior radiation? no Pacemaker/ICD? no Possible current pregnancy? no Is the patient on methotrexate? no  Current Complaints / other details:  Noted patient to be short of breath between speaking. Offered patient oxygen. Patient refused.   BP (!) 109/92   Pulse 86   Temp 97.7 F (36.5 C)   Resp 20   Wt 191 lb (86.6 kg)   SpO2 92%   BMI 34.93 kg/m

## 2023-02-05 ENCOUNTER — Telehealth: Payer: Self-pay

## 2023-02-05 ENCOUNTER — Ambulatory Visit: Payer: Medicare Other | Attending: Physician Assistant | Admitting: Physician Assistant

## 2023-02-05 ENCOUNTER — Encounter: Payer: Self-pay | Admitting: Physician Assistant

## 2023-02-05 VITALS — BP 110/78 | HR 71 | Ht 62.0 in | Wt 195.0 lb

## 2023-02-05 DIAGNOSIS — I10 Essential (primary) hypertension: Secondary | ICD-10-CM | POA: Diagnosis not present

## 2023-02-05 DIAGNOSIS — I5033 Acute on chronic diastolic (congestive) heart failure: Secondary | ICD-10-CM | POA: Diagnosis not present

## 2023-02-05 DIAGNOSIS — R0781 Pleurodynia: Secondary | ICD-10-CM | POA: Diagnosis not present

## 2023-02-05 DIAGNOSIS — N179 Acute kidney failure, unspecified: Secondary | ICD-10-CM | POA: Diagnosis not present

## 2023-02-05 DIAGNOSIS — I5032 Chronic diastolic (congestive) heart failure: Secondary | ICD-10-CM | POA: Diagnosis not present

## 2023-02-05 DIAGNOSIS — J9621 Acute and chronic respiratory failure with hypoxia: Secondary | ICD-10-CM | POA: Diagnosis not present

## 2023-02-05 DIAGNOSIS — I110 Hypertensive heart disease with heart failure: Secondary | ICD-10-CM | POA: Diagnosis not present

## 2023-02-05 DIAGNOSIS — E782 Mixed hyperlipidemia: Secondary | ICD-10-CM | POA: Insufficient documentation

## 2023-02-05 DIAGNOSIS — C541 Malignant neoplasm of endometrium: Secondary | ICD-10-CM | POA: Diagnosis not present

## 2023-02-05 DIAGNOSIS — I482 Chronic atrial fibrillation, unspecified: Secondary | ICD-10-CM | POA: Diagnosis not present

## 2023-02-05 DIAGNOSIS — I4821 Permanent atrial fibrillation: Secondary | ICD-10-CM | POA: Diagnosis not present

## 2023-02-05 MED ORDER — POTASSIUM CHLORIDE ER 10 MEQ PO TBCR: 10.0000 meq | EXTENDED_RELEASE_TABLET | Freq: Every day | ORAL | 3 refills | Status: DC

## 2023-02-05 MED ORDER — ATORVASTATIN CALCIUM 80 MG PO TABS: 80.0000 mg | ORAL_TABLET | Freq: Every day | ORAL | 3 refills | Status: DC

## 2023-02-05 MED ORDER — TIRZEPATIDE 7.5 MG/0.5ML ~~LOC~~ SOAJ: 7.5000 mg | SUBCUTANEOUS | 3 refills | Status: DC

## 2023-02-05 NOTE — Progress Notes (Signed)
Cardiology Office Note:  .   Date:  02/05/2023  ID:  Amber Stephenson, DOB 28-Jul-1945, MRN [id number] PCP: Mechele Claude, MD  [facility] Providers Cardiologist:  Armanda Magic, MD {  History of Present Illness: .   Amber Stephenson is a 77 y.o. female with a past medical history of permanent atrial fibrillation (on Eliquis), HFpEF, HTN, HLD, type II DM, stage II-III CKD, asthma, OSA (intolerant to CPAP), bipolar disorder and CAD (nonobstructive CAD by cath in 9/19) here for follow-up appointment.  Echocardiogram 7/22 showed LVEF 65 to 70% with no regional wall motion abnormalities.  Was hospitalized a week after a fall.  Had unsteady gait and frequent falls and was found to be hypotensive with concerns of sepsis physiology.  She developed right-sided pleuritic chest pain that was felt to be musculoskeletal.  2D echo gram showed LVEF 67 to 70% and mitral annular calcifications.  Troponin minimally elevated 38> 30> 17> 15.  Continue to have pleuritic chest pain on the right side which was much worse with deep breathing.  Chest CT did not show any rib fractures.  She has had some SOB that she thinks is because she cannot tolerate a deep breath in.  Her frequent falls were felt to be related to polypharmacy/antihypertensive/diuretic use/narcotics/benzos/psychotropic drugs.  She was last seen in April 2024 and at that time she had an ongoing complaint of right-sided pleuritic chest pain that is completely reproducible with palpation over her right chest wall and worse with movement and deep breathing.  Also makes her feel short of breath.  This was noted in the hospital as well to be musculoskeletal.  Today, she tells me she was in the ER twice last month.  She was fluid overloaded and had acute pulmonary edema.  Needed IV Lasix for diuretics.  Weight at discharge was 199 pounds and this is considered her "dry weight".  She was 192 at home.  Likely a few pounds fluid overloaded.  Having no chest  pain or shortness of breath today luckily.  She is currently on Lasix 20 mg twice daily.  No chest pain.  Unfortunately, on the way to the ER she broke her left toe and is in a boot right now.  In a wheelchair.  Unable to do her usual activities.  Cannot walk 1-2 blocks and cannot pull up and down a flight of stairs.  She is able to do some simple household tasks but that is about it.  Scheduled to have GYN surgery coming up but would hold off for now if not absolutely necessary until her heart fully recovers.  Reports no shortness of breath nor dyspnea on exertion. Reports no chest pain, pressure, or tightness. No edema, orthopnea, PND. Reports no palpitations.   ROS: Pertinent ROS in HPI  Studies Reviewed: .        Cardiac Catheterization: 01/2018 Prox LAD lesion is 40% stenosed. Mid LAD lesion is 30% stenosed. Ost 1st Mrg to 1st Mrg lesion is 30% stenosed. LV end diastolic pressure is moderately elevated.   1. Nonobstructive CAD with left dominant circulation 2. Moderately elevated LVEDP (post hydration)   Plan: medical therapy. Will resume Eliquis this pm. Continue diuretic therapy.   No indication for antiplatelet therapy at this time.   Echocardiogram: 11/2020 IMPRESSIONS     1. Left ventricular ejection fraction, by estimation, is 65 to 70%. The  left ventricle has normal function. The left ventricle has no regional  wall motion abnormalities. There is moderate  left ventricular hypertrophy.  Left ventricular diastolic  parameters are indeterminate.   2. Right ventricular systolic function is low normal. The right  ventricular size is mildly enlarged. There is normal pulmonary artery  systolic pressure.   3. Left atrial size was severely dilated.   4. Right atrial size was severely dilated.   5. The mitral valve is abnormal. Trivial mitral valve regurgitation. No  evidence of mitral stenosis. Moderate mitral annular calcification.   6. The aortic valve was not well  visualized. Aortic valve regurgitation  is not visualized. No aortic stenosis is present.   7. The inferior vena cava is dilated in size with >50% respiratory  variability, suggesting right atrial pressure of 8 mmHg.        Physical Exam:   VS:  BP 110/78   Pulse 71   Ht 5\' 2"  (1.575 m)   Wt 195 lb (88.5 kg)   SpO2 95%   BMI 35.67 kg/m    Wt Readings from Last 3 Encounters:  02/05/23 195 lb (88.5 kg)  02/04/23 190 lb (86.2 kg)  02/03/23 196 lb (88.9 kg)    GEN: Well nourished, well developed in no acute distress NECK: No JVD; No carotid bruits CARDIAC: Irregularly irregular, no murmurs, rubs, gallops RESPIRATORY:  Clear to auscultation without rales, wheezing or rhonchi  ABDOMEN: Soft, non-tender, non-distended EXTREMITIES:  No edema; No deformity   ASSESSMENT AND PLAN: .   1.  Permanent atrial fibrillation -Rate controlled today, heart rate 71 -Would continue current medication regimen including Eliquis 5 mg twice a day, Lipitor increased to 80 mg daily, Lasix 20 mg twice a day ( will increase to 40 mg in the morning and 20 mg in the evening for 3 days) then she can return to 20 mg twice daily -Plan to get some repeat lab work to recheck her kidney function  2.  Chronic diastolic heart failure -Euvolemic on exam however, patient states that she is up a few pounds -We have increased her diuretics for the next couple of days and asked her to weigh daily -If she notices a 2 to 3 pound increase overnight or 5 pounds in a week she is to call our office -Recommended low-sodium, heart healthy diet  3.  CAD -No chest pain, would continue with current medication regimen  4.  HTN -Blood pressure low normal today 110/78, continue current medications  5.  HLD -Lipitor increased to 80 mg daily -Last LDL was not at goal, 141.  Goal less than 70 based on CAD -Lipid panel in 3 months      Dispo: She can follow-up in 3 months with Dr. Mayford Knife or APP  Signed, Sharlene Dory, PA-C

## 2023-02-05 NOTE — Patient Instructions (Signed)
Medication Instructions:   START TAKING:  POTASSIUM 10 MEQ   START TAKING:  LIPITOR 80 MG ONCE A DAY   FOR 3 DAYS ONLY : LASIX 40 MG IN THE AM AND 20 MG IN THE PM   THEN RESUME BACK TAKING : LASIX 20 MG IN THE AM AND 20 MG IN THE PM  :  *If you need a refill on your cardiac medications before your next appointment, please call your pharmacy*   Lab Work: RETURN IN 2 WEEKS BMET   RETURN IN 3 MONTHS LFT AND LIPIDS    If you have labs (blood work) drawn today and your tests are completely normal, you will receive your results only by: MyChart Message (if you have MyChart) OR A paper copy in the mail If you have any lab test that is abnormal or we need to change your treatment, we will call you to review the results.   Testing/Procedures: NONE ORDERED  TODAY     Follow-Up: At [facility], you and your health needs are our priority.  As part of our continuing mission to provide you with exceptional heart care, we have created designated Provider Care Teams.  These Care Teams include your primary Cardiologist (physician) and Advanced Practice Providers (APPs -  Physician Assistants and Nurse Practitioners) who all work together to provide you with the care you need, when you need it.  We recommend signing up for the patient portal called "MyChart".  Sign up information is provided on this After Visit Summary.  MyChart is used to connect with patients for Virtual Visits (Telemedicine).  Patients are able to view lab/test results, encounter notes, upcoming appointments, etc.  Non-urgent messages can be sent to your provider as well.   To learn more about what you can do with MyChart, go to [email or web address].    Your next appointment:   3 month(s)  Provider:   Armanda Magic, MD  or Jari Favre, PA-C, Robin Searing, NP, Jacolyn Reedy, PA-C, Eligha Bridegroom, NP, Tereso Newcomer, PA-C, or Perlie Gold, PA-C       Other Instructions    Low-Sodium Eating Plan Salt (sodium)  helps you keep a healthy balance of fluids in your body. Too much sodium can raise your blood pressure. It can also cause fluid and waste to be held in your body. Your health care provider or dietitian may recommend a low-sodium eating plan if you have high blood pressure (hypertension), kidney disease, liver disease, or heart failure. Eating less sodium can help lower your blood pressure and reduce swelling. It can also protect your heart, liver, and kidneys. What are tips for following this plan? Reading food labels  Check food labels for the amount of sodium per serving. If you eat more than one serving, you must multiply the listed amount by the number of servings. Choose foods with less than 140 milligrams (mg) of sodium per serving. Avoid foods with 300 mg of sodium or more per serving. Always check how much sodium is in a product, even if the label says "unsalted" or "no salt added." Shopping  Buy products labeled as "low-sodium" or "no salt added." Buy fresh foods. Avoid canned foods and pre-made or frozen meals. Avoid canned, cured, or processed meats. Buy breads that have less than 80 mg of sodium per slice. Cooking  Eat more home-cooked food. Try to eat less restaurant, buffet, and fast food. Try not to add salt when you cook. Use salt-free seasonings or herbs instead of table salt  or sea salt. Check with your provider or pharmacist before using salt substitutes. Cook with plant-based oils, such as canola, sunflower, or olive oil. Meal planning When eating at a restaurant, ask if your food can be made with less salt or no salt. Avoid dishes labeled as brined, pickled, cured, or smoked. Avoid dishes made with soy sauce, miso, or teriyaki sauce. Avoid foods that have monosodium glutamate (MSG) in them. MSG may be added to some restaurant food, sauces, soups, bouillon, and canned foods. Make meals that can be grilled, baked, poached, roasted, or steamed. These are often made with less  sodium. General information Try to limit your sodium intake to 1,500-2,300 mg each day, or the amount told by your provider. What foods should I eat? Fruits Fresh, frozen, or canned fruit. Fruit juice. Vegetables Fresh or frozen vegetables. "No salt added" canned vegetables. "No salt added" tomato sauce and paste. Low-sodium or reduced-sodium tomato and vegetable juice. Grains Low-sodium cereals, such as oats, puffed wheat and rice, and shredded wheat. Low-sodium crackers. Unsalted rice. Unsalted pasta. Low-sodium bread. Whole grain breads and whole grain pasta. Meats and other proteins Fresh or frozen meat, poultry, seafood, and fish. These should have no added salt. Low-sodium canned tuna and salmon. Unsalted nuts. Dried peas, beans, and lentils without added salt. Unsalted canned beans. Eggs. Unsalted nut butters. Dairy Milk. Soy milk. Cheese that is naturally low in sodium, such as ricotta cheese, fresh mozzarella, or Swiss cheese. Low-sodium or reduced-sodium cheese. Cream cheese. Yogurt. Seasonings and condiments Fresh and dried herbs and spices. Salt-free seasonings. Low-sodium mustard and ketchup. Sodium-free salad dressing. Sodium-free light mayonnaise. Fresh or refrigerated horseradish. Lemon juice. Vinegar. Other foods Homemade, reduced-sodium, or low-sodium soups. Unsalted popcorn and pretzels. Low-salt or salt-free chips. The items listed above may not be all the foods and drinks you can have. Talk to a dietitian to learn more. What foods should I avoid? Vegetables Sauerkraut, pickled vegetables, and relishes. Olives. [place] fries. Onion rings. Regular canned vegetables, except low-sodium or reduced-sodium items. Regular canned tomato sauce and paste. Regular tomato and vegetable juice. Frozen vegetables in sauces. Grains Instant hot cereals. Bread stuffing, pancake, and biscuit mixes. Croutons. Seasoned rice or pasta mixes. Noodle soup cups. Boxed or frozen macaroni and cheese.  Regular salted crackers. Self-rising flour. Meats and other proteins Meat or fish that is salted, canned, smoked, spiced, or pickled. Precooked or cured meat, such as sausages or meat loaves. Tomasa Blase. Ham. Pepperoni. Hot dogs. Corned beef. Chipped beef. Salt pork. Jerky. Pickled herring, anchovies, and sardines. Regular canned tuna. Salted nuts. Dairy Processed cheese and cheese spreads. Hard cheeses. Cheese curds. Blue cheese. Feta cheese. String cheese. Regular cottage cheese. Buttermilk. Canned milk. Fats and oils Salted butter. Regular margarine. Ghee. Bacon fat. Seasonings and condiments Onion salt, garlic salt, seasoned salt, table salt, and sea salt. Canned and packaged gravies. Worcestershire sauce. Tartar sauce. Barbecue sauce. Teriyaki sauce. Soy sauce, including reduced-sodium soy sauce. Steak sauce. Fish sauce. Oyster sauce. Cocktail sauce. Horseradish that you find on the shelf. Regular ketchup and mustard. Meat flavorings and tenderizers. Bouillon cubes. Hot sauce. Pre-made or packaged marinades. Pre-made or packaged taco seasonings. Relishes. Regular salad dressings. Salsa. Other foods Salted popcorn and pretzels. Corn chips and puffs. Potato and tortilla chips. Canned or dried soups. Pizza. Frozen entrees and pot pies. The items listed above may not be all the foods and drinks you should avoid. Talk to a dietitian to learn more. This information is not intended to replace advice given to  you by your health care provider. Make sure you discuss any questions you have with your health care provider. Document Revised: 06/05/2022 Document Reviewed: 06/05/2022 Elsevier Patient Education  2024 [employer]  Heart-Healthy Eating Plan Eating a healthy diet is important for the health of your heart. A heart-healthy eating plan includes: Eating less unhealthy fats. Eating more healthy fats. Eating less salt in your food. Salt is also called sodium. Making other changes in your diet. Talk  with your doctor or a diet specialist (dietitian) to create an eating plan that is right for you. What is my plan? Your doctor may recommend an eating plan that includes: Total fat: ______% or less of total calories a day. Saturated fat: ______% or less of total calories a day. Cholesterol: less than _________mg a day. Sodium: less than _________mg a day. What are tips for following this plan? Cooking Avoid frying your food. Try to bake, boil, grill, or broil it instead. You can also reduce fat by: Removing the skin from poultry. Removing all visible fats from meats. Steaming vegetables in water or broth. Meal planning  At meals, divide your plate into four equal parts: Fill one-half of your plate with vegetables and green salads. Fill one-fourth of your plate with whole grains. Fill one-fourth of your plate with lean protein foods. Eat 2-4 cups of vegetables per day. One cup of vegetables is: 1 cup (91 g) broccoli or cauliflower florets. 2 medium carrots. 1 large bell pepper. 1 large sweet potato. 1 large tomato. 1 medium white potato. 2 cups (150 g) raw leafy greens. Eat 1-2 cups of fruit per day. One cup of fruit is: 1 small apple 1 large banana 1 cup (237 g) mixed fruit, 1 large orange,  cup (82 g) dried fruit, 1 cup (240 mL) 100% fruit juice. Eat more foods that have soluble fiber. These are apples, broccoli, carrots, beans, peas, and barley. Try to get 20-30 g of fiber per day. Eat 4-5 servings of nuts, legumes, and seeds per week: 1 serving of dried beans or legumes equals  cup (90 g) cooked. 1 serving of nuts is  oz (12 almonds, 24 pistachios, or 7 walnut halves). 1 serving of seeds equals  oz (8 g). General information Eat more home-cooked food. Eat less restaurant, buffet, and fast food. Limit or avoid alcohol. Limit foods that are high in starch and sugar. Avoid fried foods. Lose weight if you are overweight. Keep track of how much salt (sodium) you eat.  This is important if you have high blood pressure. Ask your doctor to tell you more about this. Try to add vegetarian meals each week. Fats Choose healthy fats. These include olive oil and canola oil, flaxseeds, walnuts, almonds, and seeds. Eat more omega-3 fats. These include salmon, mackerel, sardines, tuna, flaxseed oil, and ground flaxseeds. Try to eat fish at least 2 times each week. Check food labels. Avoid foods with trans fats or high amounts of saturated fat. Limit saturated fats. These are often found in animal products, such as meats, butter, and cream. These are also found in plant foods, such as palm oil, palm kernel oil, and coconut oil. Avoid foods with partially hydrogenated oils in them. These have trans fats. Examples are stick margarine, some tub margarines, cookies, crackers, and other baked goods. What foods should I eat? Fruits All fresh, canned (in natural juice), or frozen fruits. Vegetables Fresh or frozen vegetables (raw, steamed, roasted, or grilled). Green salads. Grains Most grains. Choose whole wheat and  whole grains most of the time. Rice and pasta, including brown rice and pastas made with whole wheat. Meats and other proteins Lean, well-trimmed beef, veal, pork, and lamb. Chicken and [place] without skin. All fish and shellfish. Wild duck, rabbit, pheasant, and venison. Egg whites or low-cholesterol egg substitutes. Dried beans, peas, lentils, and tofu. Seeds and most nuts. Dairy Low-fat or nonfat cheeses, including ricotta and mozzarella. Skim or 1% milk that is liquid, powdered, or evaporated. Buttermilk that is made with low-fat milk. Nonfat or low-fat yogurt. Fats and oils Non-hydrogenated (trans-free) margarines. Vegetable oils, including soybean, sesame, sunflower, olive, peanut, safflower, corn, canola, and cottonseed. Salad dressings or mayonnaise made with a vegetable oil. Beverages Mineral water. Coffee and tea. Diet carbonated beverages. Sweets and  desserts Sherbet, gelatin, and fruit ice. Small amounts of dark chocolate. Limit all sweets and desserts. Seasonings and condiments All seasonings and condiments. The items listed above may not be a complete list of foods and drinks you can eat. Contact a dietitian for more options. What foods should I avoid? Fruits Canned fruit in heavy syrup. Fruit in cream or butter sauce. Fried fruit. Limit coconut. Vegetables Vegetables cooked in cheese, cream, or butter sauce. Fried vegetables. Grains Breads that are made with saturated or trans fats, oils, or whole milk. Croissants. Sweet rolls. Donuts. High-fat crackers, such as cheese crackers. Meats and other proteins Fatty meats, such as hot dogs, ribs, sausage, bacon, rib-eye roast or steak. High-fat deli meats, such as salami and bologna. Caviar. Domestic duck and goose. Organ meats, such as liver. Dairy Cream, sour cream, cream cheese, and creamed cottage cheese. Whole-milk cheeses. Whole or 2% milk that is liquid, evaporated, or condensed. Whole buttermilk. Cream sauce or high-fat cheese sauce. Yogurt that is made from whole milk. Fats and oils Meat fat, or shortening. Cocoa butter, hydrogenated oils, palm oil, coconut oil, palm kernel oil. Solid fats and shortenings, including bacon fat, salt pork, lard, and butter. Nondairy cream substitutes. Salad dressings with cheese or sour cream. Beverages Regular sodas and juice drinks with added sugar. Sweets and desserts Frosting. Pudding. Cookies. Cakes. Pies. Milk chocolate or white chocolate. Buttered syrups. Full-fat ice cream or ice cream drinks. The items listed above may not be a complete list of foods and drinks to avoid. Contact a dietitian for more information. Summary Heart-healthy meal planning includes eating less unhealthy fats, eating more healthy fats, and making other changes in your diet. Eat a balanced diet. This includes fruits and vegetables, low-fat or nonfat dairy, lean  protein, nuts and legumes, whole grains, and heart-healthy oils and fats. This information is not intended to replace advice given to you by your health care provider. Make sure you discuss any questions you have with your health care provider. Document Revised: 06/24/2021 Document Reviewed: 06/24/2021 Elsevier Patient Education  2024 [employer].

## 2023-02-05 NOTE — Telephone Encounter (Signed)
Patient states that she is ready to go the next higher dose of Mounjaro if possible.

## 2023-02-06 DIAGNOSIS — M1712 Unilateral primary osteoarthritis, left knee: Secondary | ICD-10-CM | POA: Diagnosis not present

## 2023-02-06 DIAGNOSIS — S92505D Nondisplaced unspecified fracture of left lesser toe(s), subsequent encounter for fracture with routine healing: Secondary | ICD-10-CM | POA: Diagnosis not present

## 2023-02-09 ENCOUNTER — Telehealth: Payer: Self-pay | Admitting: Psychiatry

## 2023-02-09 ENCOUNTER — Other Ambulatory Visit: Payer: Self-pay | Admitting: Oncology

## 2023-02-09 NOTE — Telephone Encounter (Signed)
Called pt regarding her aygestin. Discussed with her cardiology team. May not be the primary reason or heart failure exacerbation, but may be worthwhile trialling off and see how her bleeding dose. Bleeding precautions reviewed. Sees Dr. Roselind Messier on Wednesday for radiation oncology consult.

## 2023-02-09 NOTE — Progress Notes (Signed)
Gynecologic Oncology Multi-Disciplinary Disposition Conference Note  Date of the Conference: 02/09/2023  Patient Name: Amber Stephenson  Referring Provider: Dr. Charlotta Newton Primary GYN Oncologist: Dr. Alvester Morin   Stage/Disposition:  Stage IB, FIGO grade 1. Disposition is to definitive radiation treatment with external beam and boost to the uterus.   This Multidisciplinary conference took place involving physicians from Gynecologic Oncology, Medical Oncology, Radiation Oncology, Pathology, Radiology along with the Gynecologic Oncology Nurse Practitioner and Gynecologic Oncology Nurse Navigator.  Comprehensive assessment of the patient's malignancy, staging, need for surgery, chemotherapy, radiation therapy, and need for further testing were reviewed. Supportive measures, both inpatient and following discharge were also discussed. The recommended plan of care is documented. Greater than 35 minutes were spent correlating and coordinating this patient's care.

## 2023-02-10 ENCOUNTER — Other Ambulatory Visit: Payer: Self-pay | Admitting: Pulmonary Disease

## 2023-02-10 ENCOUNTER — Telehealth: Payer: Self-pay | Admitting: Family Medicine

## 2023-02-10 ENCOUNTER — Telehealth: Payer: Self-pay | Admitting: Pulmonary Disease

## 2023-02-10 DIAGNOSIS — J9621 Acute and chronic respiratory failure with hypoxia: Secondary | ICD-10-CM | POA: Diagnosis not present

## 2023-02-10 DIAGNOSIS — I4821 Permanent atrial fibrillation: Secondary | ICD-10-CM | POA: Diagnosis not present

## 2023-02-10 DIAGNOSIS — J9611 Chronic respiratory failure with hypoxia: Secondary | ICD-10-CM

## 2023-02-10 DIAGNOSIS — I110 Hypertensive heart disease with heart failure: Secondary | ICD-10-CM | POA: Diagnosis not present

## 2023-02-10 DIAGNOSIS — N179 Acute kidney failure, unspecified: Secondary | ICD-10-CM | POA: Diagnosis not present

## 2023-02-10 DIAGNOSIS — C541 Malignant neoplasm of endometrium: Secondary | ICD-10-CM | POA: Diagnosis not present

## 2023-02-10 DIAGNOSIS — I5033 Acute on chronic diastolic (congestive) heart failure: Secondary | ICD-10-CM | POA: Diagnosis not present

## 2023-02-10 NOTE — Progress Notes (Signed)
Radiation Oncology         ([phone]) [phone] ________________________________  Initial Outpatient Consultation  Name: Amber Stephenson MRN: [id number]  Date: 02/11/2023  DOB: February 17, 1946  NW:GNFAOZ, Broadus John, MD  [name], MD   REFERRING PHYSICIAN: [name], MD  DIAGNOSIS: {There were no encounter diagnoses. 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 female who is accompanied by ***. she is seen as a courtesy of Dr. Alvester Morin for an opinion concerning radiation therapy as part of management for her recently diagnosed endometrial cancer. She has a past medical history notable for afib, CAD, CHF, chronic respiratory failure, chronic pain, and OSA.    The patient initially presented to her OB/GYN this past June with several days of postmenopausal bleeding, burning with urination, and vaginal and rectal itching. Urinalysis and cultures were obtained and came back negative, and her rectal and vaginal itching were treated with gentian violet. In light of her PMB, a pelvic ultrasound was performed on 11/25/22 which demonstrated a prominent endometrial stripe measuring 8.9 mm and a non-enlarged uterus. No adnexal masses were appreciated.   Her symptoms (including PMB) resolved on their own for a short period of time. However, she returned to her OB/GYN on 12/22/22 with complaints of recurrent vulvar itching and vaginal bleeding.   Given her recurrent symptoms, an endometrial biopsy was collected on 12/25/22 which revealed FIGO grade 1 endometrioid carcinoma. She was also  started on Aygestin 10 mg daily at this time which resolved her vaginal bleeding.   Subsequently, the patient was referred to Dr. Alvester Morin on 01/05/23 for further evaluation and management. During which time, the patient reported intermittent (on and off) vaginal spotting for the past 5 years which had increased over the past year. She also reported a 10 pound weight loss, abdominal bloating, and early satiety over the past month.   Given the patient's multiple medical comorbidities, Dr. Alvester Morin does not recommend surgery at this time. In lieu of surgical management, Dr. Alvester Morin recommends proceeding with either hormonal management, radiation treatment, or chemotherapy.    In order to determine if patient would be an appropriate candidate for hormonal therapy, imaging of the pelvis was recommended to evaluate for myometrial invasion and to rule out metastatic disease.   While attempting to lay down flat for a CT scan on 01/12/23, the patient developed acute respiratory distress. She was subsequently sent to the ED where she was found to have evidence of likely flash pulmonary edema from atrial fibrillation with RVR. Subsequently, the patient was hospitalized from 01/12/23 through 01/18/23 for management of acute on chronic respiratory failure with hypoxia. Hospital course included BiPAP, Lasix IV diuresis, and management for acute heart failure with improvement in urine output and stable kidney function maintained. She was eventually transitioned to oral Lasix at discharge.   She did have a CT AP without contrast performed while inpatient on 01/13/23 which demonstrated no evidence of metastatic disease seen in the chest, abdomen, or pelvis on noncontrast imaging. CT however showed perihilar ground-glass infiltrates in the lungs concerning for edema or multifocal pneumonia (addressed during her hospitalization).   Pertinent  imaging performed thus far includes an MRI of the pelvis with and without contrast on  01/26/23 which showed a 3.9 cm endometrial mass, with suspected 50% myometrial invasion (considered borderline for FIGO stage IA/1B by MRI). Imaging otherwise showed no evidence of metastatic disease in the pelvis.   Based on MRI findings, she would not be a good candidate for hormonal therapy due to the extent of (suspected) myometrial invasion. Dr. Alvester Morin subsequently recommends proceeding with radiation therapy.  She was initially scheduled to be seen here in consultation on 01/28/23. However, the patient presented to the ED that day with shortness of breath with concern for acute heart failure. Cardiology was consulted and she was admitted for management of chronic respiratory failure with hypoxia, and acute on chronic diastolic heart failure. Hospital course included Lasix diuresis with improvement in her condition prior to discharge on 01/30/23, and 4L supplemental O2 via [place]. She was also found to have a left foot fracture while inpatient and was arranged with OP ortho-surgery referral at discharge.   Since being discharged, the patient's case was recently presented at the Gyn-Onc tumor board on 02/09/23. Disposition concluded is to definitive radiation treatment with external beam and boost to the uterus.  Of note: the patient uses oxygen at home as needed at 2L.   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   Benign paroxysmal positional vertigo due to bilateral vestibular disorder 05/20/2019   Bipolar 1 disorder (HCC) 01/23/2015   with GAD, benzo dependence   BPPV (benign paroxysmal  positional vertigo) 05/20/2019   CAD (coronary artery disease)    Nonobstructive; Managed by Dr. Purvis Sheffield   Cardiomegaly 01/12/2018   CHF (congestive heart failure) 06/08/2022   Chronic back pain 01/04/2015   Chronic constipation 04/25/2020   Chronic diastolic heart failure (HCC) 05/30/2015   Chronic pain syndrome 08/22/2019   back pain, sacroiliitis   Chronic pain syndrome 08/22/2019   Chronic post-traumatic stress disorder (PTSD) 12/06/2020   Diabetic neuropathy (HCC) 02/06/2016   Dyslipidemia 09/24/2020   Dyspnea on exertion    Essential hypertension    Family history of coronary arteriosclerosis 05/30/2015   Functional diarrhea 10/26/2020   Head trauma 09/17/2020   Hemorrhoids 03/11/2022   Herpes genitalis in women 07/16/2015   History of adenomatous polyp of colon 05/21/2019   Overview:   03/31/17: Colonoscopy: nonadvanced adenoma, microscopic colitis, f/u 5 yrs, [facility]/GAP   Insomnia 01/23/2015   Lipoma 02/08/2015   Migraine headache with aura 02/12/2016   Myofascial pain dysfunction syndrome 08/22/2019   Myofascial pain dysfunction syndrome 08/22/2019   Non-alcoholic fatty liver disease 01/12/2018   Opioid dependence (HCC) 12/06/2020   OSA (obstructive sleep apnea) 02/24/2019   10/09/2018 - HST  - AHI 40.6    Osteopenia 12/06/2020   Rx alendronate 35 mg.   Pinguecula 12/06/2020   Postcoital bleeding 12/06/2020   Presbyopia 12/06/2020   Pulmonary hypertension    RLS (restless legs syndrome) 04/27/2015   Superficial fungus infection of skin 09/03/2021   Tremor, essential 12/11/2021   Type II diabetes mellitus (HCC) 09/24/2020    PAST SURGICAL HISTORY: Past Surgical History:  Procedure Laterality Date  BREAST REDUCTION SURGERY     EYE SURGERY Right    cateracts   HAMMER TOE SURGERY     LEFT HEART CATH AND CORONARY ANGIOGRAPHY N/A 02/03/2018   Procedure: LEFT HEART CATH AND CORONARY ANGIOGRAPHY;  Surgeon: [name], Peter M, MD;  Location: [facility]IVE [facility];   Service: Cardiovascular;  Laterality: N/A;   REVERSE SHOULDER ARTHROPLASTY Right 08/19/2019   Procedure: REVERSE SHOULDER ARTHROPLASTY;  Surgeon: Beverely Low, MD;  Location: [facility] ORS;  Service: Orthopedics;  Laterality: Right;  interscalene block   SHOULDER SURGERY Right    "I BROKE MY SHOUDLER   THIGH SURGERY     "TO REMOVE A TUMOR "    FAMILY HISTORY:  Family History  Problem Relation Age of Onset   Diabetes Mother    Heart disease Mother    Alzheimer's disease Father    Heart disease Sister        CABG   Diabetes Sister    Alcohol abuse Sister    Stroke Brother    Heart disease Brother    Mental illness Brother    Diabetes Brother    Breast cancer Neg Hx    Ovarian cancer Neg Hx    Colon cancer Neg Hx    Endometrial cancer Neg Hx     SOCIAL HISTORY:  Social History   Tobacco Use   Smoking status: Never   Smokeless tobacco: Never  Vaping Use   Vaping status: Never Used  Substance Use Topics   Alcohol use: Not Currently    Alcohol/week: 0.0 standard drinks of alcohol   Drug use: No    ALLERGIES:  Allergies  Allergen Reactions   Iodine Anaphylaxis   Ivp Dye [Iodinated Contrast Media] Anaphylaxis, Swelling and Other (See Comments)    Throat closes    Latex Other (See Comments)    Latex tape pulls skin with it   Tape Other (See Comments)    Pulls off the skin, if latex   Tizanidine Other (See Comments)    Weakness, goofy, bad dreams    MEDICATIONS:  Current Outpatient Medications  Medication Sig Dispense Refill   albuterol (VENTOLIN HFA) 108 (90 Base) MCG/ACT inhaler Inhale 2 puffs into the lungs every 6 (six) hours as needed for wheezing or shortness of breath. 1 each 0   allopurinol (ZYLOPRIM) 100 MG tablet Take 100 mg by mouth daily.     apixaban (ELIQUIS) 5 MG TABS tablet Take 1 tablet (5 mg total) by mouth 2 (two) times daily. 180 tablet 3   ARIPiprazole (ABILIFY) 2 MG tablet Take 2 mg by mouth at bedtime.     atorvastatin (LIPITOR) 80 MG tablet Take  1 tablet (80 mg total) by mouth daily. 90 tablet 3   BELSOMRA 20 MG TABS Take 20 mg by mouth at bedtime as needed (for sleep).     Continuous Blood Gluc Sensor (DEXCOM G7 SENSOR) MISC Change sensor every 10 days (Patient taking differently: Inject 1 Device into the skin See admin instructions. Place 1 new sensor into the skin every 10 days) 9 each 3   cyanocobalamin (VITAMIN B12) 1000 MCG tablet Take 1 tablet (1,000 mcg total) by mouth daily. 30 tablet 1   denosumab (PROLIA) 60 MG/ML SOSY injection Inject 60 mg into the skin every 6 (six) months. 1 mL 1   doxepin (SINEQUAN) 10 MG capsule Take 10 mg by mouth at bedtime.     escitalopram (LEXAPRO) 10 MG tablet Take 10 mg by mouth daily.  fluticasone-salmeterol (ADVAIR DISKUS) 100-50 MCG/ACT AEPB Inhale 1 puff into the lungs 2 (two) times daily. (Patient taking differently: Inhale 1 puff into the lungs 2 (two) times daily as needed (for flares).) 1 each 5   furosemide (LASIX) 20 MG tablet Take 20 mg by mouth See admin instructions. Take 20 mg by mouth at 8 AM and 6 PM     Galcanezumab-gnlm (EMGALITY) 120 MG/ML SOAJ INJECT CONTENTS ON 1 PEN(120MG ) INTO THE SKIN ONCE MONTHLY (Patient taking differently: Inject 120 mg into the skin every 30 (thirty) days.) 1 mL 3   insulin glargine (LANTUS SOLOSTAR) 100 UNIT/ML Solostar Pen Inject 35 Units into the skin 2 (two) times daily. 60 mL 3   insulin lispro (HUMALOG KWIKPEN) 100 UNIT/ML KwikPen Max daily 50 units (Patient taking differently: Inject 4 Units into the skin See admin instructions. Inject 4 units into the skin 2 times a day with meals, per sliding scale. Add 1 additional unit for every 30 points above a BGL reading of 160.) 45 mL 6   Insulin Pen Needle 29G X MISC 1 Device by Does not apply route daily in the afternoon. 400 each 3   LORazepam (ATIVAN) 0.5 MG tablet Take 0.5-1 mg by mouth See admin instructions. Take 0.5 mg by mouth in the morning and 1 mg at bedtime     metFORMIN (GLUCOPHAGE-XR)  500 MG 24 hr tablet Take 2 tablets (1,000 mg total) by mouth daily with breakfast. 180 tablet 3   metoprolol tartrate (LOPRESSOR) 50 MG tablet Take 1 tablet (50 mg total) by mouth 2 (two) times daily. 180 tablet 2   morphine (MS CONTIN) 15 MG 12 hr tablet Take 1 tablet (15 mg total) by mouth every 12 (twelve) hours. For chronic pain- can refill by 01/13/23 60 tablet 0   naloxone (NARCAN) nasal spray 4 mg/0.1 mL Place 1 spray into the nose once as needed (AS DIRECTED).     norethindrone (AYGESTIN) 5 MG tablet Take 2 tablets (10 mg total) by mouth daily. 60 tablet 0   nystatin (MYCOSTATIN/NYSTOP) powder Apply 1 Application topically as needed (rash). 15 g 2   nystatin ointment (MYCOSTATIN) Apply 1 Application topically 2 (two) times daily. (Patient taking differently: Apply 1 Application topically 2 (two) times daily as needed (for rashes).) 30 g 3   ondansetron (ZOFRAN-ODT) 8 MG disintegrating tablet Take 1 tablet (8 mg total) by mouth every 6 (six) hours as needed for nausea or vomiting. 20 tablet 1   OXcarbazepine (TRILEPTAL) 150 MG tablet Take 1 tablet (150 mg total) by mouth 2 (two) times daily. 60 tablet 0   pantoprazole (PROTONIX) 40 MG tablet Take 40 mg by mouth daily as needed (for reflux).     potassium chloride (KLOR-CON) 10 MEQ tablet Take 1 tablet (10 mEq total) by mouth daily. 90 tablet 3   pregabalin (LYRICA) 200 MG capsule Take 200 mg by mouth 2 (two) times daily.     senna-docusate (SENOKOT-S) 8.6-50 MG tablet Take 1 tablet by mouth at bedtime. (Patient taking differently: Take 1 tablet by mouth at bedtime as needed (for constipation).) 30 tablet 0   spironolactone (ALDACTONE) 25 MG tablet Take 0.5 tablets (12.5 mg total) by mouth daily. 15 tablet 2   tirzepatide (MOUNJARO) 7.5 MG/0.5ML Pen Inject 7.5 mg into the skin once a week. 6 mL 3   TYLENOL 500 MG tablet Take 500-1,000 mg by mouth every 6 (six) hours as needed for mild pain or headache.     Vitamin  D, Cholecalciferol, 25 MCG  (1000 UT) TABS Take 1,000 Units by mouth daily in the afternoon.     No current facility-administered medications for this encounter.   Facility-Administered Medications Ordered in Other Encounters  Medication Dose Route Frequency Provider Last Rate Last Admin   bupivacaine-epinephrine (MARCAINE W/ EPI) 0.5% -1:200000 injection    Anesthesia Intra-op Shelton Silvas, MD   12 mL at 01/21/19 0935    REVIEW OF SYSTEMS:  A 10+ POINT REVIEW OF SYSTEMS WAS OBTAINED including neurology, dermatology, psychiatry, cardiac, respiratory, lymph, extremities, GI, GU, musculoskeletal, constitutional, reproductive, HEENT. ***   PHYSICAL EXAM:  vitals were not taken for this visit.   General: Alert and oriented, in no acute distress HEENT: Head is normocephalic. Extraocular movements are intact. Oropharynx is clear. Neck: Neck is supple, no palpable cervical or supraclavicular lymphadenopathy. Heart: Regular in rate and rhythm with no murmurs, rubs, or gallops. Chest: Clear to auscultation bilaterally, with no rhonchi, wheezes, or rales. Abdomen: Soft, nontender, nondistended, with no rigidity or guarding. Extremities: No cyanosis or edema. Lymphatics: see Neck Exam Skin: No concerning lesions. Musculoskeletal: symmetric strength and muscle tone throughout. Neurologic: Cranial nerves II through XII are grossly intact. No obvious focalities. Speech is fluent. Coordination is intact. Psychiatric: Judgment and insight are intact. Affect is appropriate.  On pelvic examination the external genitalia were unremarkable. A speculum exam was performed. There are no mucosal lesions noted in the vaginal vault. A Pap smear was obtained of the proximal vagina. On bimanual and rectovaginal examination there were no pelvic masses appreciated. ***   ECOG = ***  0 - Asymptomatic (Fully active, able to carry on all predisease activities without restriction)  1 - Symptomatic but completely ambulatory (Restricted in  physically strenuous activity but ambulatory and able to carry out work of a light or sedentary nature. For example, light housework, office work)  2 - Symptomatic, <50% in bed during the day (Ambulatory and capable of all self care but unable to carry out any work activities. Up and about more than 50% of waking hours)  3 - Symptomatic, >50% in bed, but not bedbound (Capable of only limited self-care, confined to bed or chair 50% or more of waking hours)  4 - Bedbound (Completely disabled. Cannot carry on any self-care. Totally confined to bed or chair)  5 - Death   Santiago Glad MM, Creech RH, Tormey DC, et al. [phone]). "Toxicity and response criteria of the [facility]". Am. Evlyn Clines. Oncol. 5 (6): 649-55  LABORATORY DATA:  Lab Results  Component Value Date   WBC 10.7 (H) 01/28/2023   HGB 11.8 (L) 01/28/2023   HCT 38.3 01/28/2023   MCV 90.5 01/28/2023   PLT 217 01/28/2023   NEUTROABS 6.3 12/25/2022   Lab Results  Component Value Date   NA 133 (L) 02/03/2023   K 5.6 (H) 02/03/2023   CL 98 02/03/2023   CO2 18 (L) 02/03/2023   GLUCOSE 421 (HH) 02/03/2023   BUN 11 02/03/2023   CREATININE 0.79 02/03/2023   CALCIUM 8.8 02/03/2023      RADIOGRAPHY: DG Tibia/Fibula Left  Result Date: 01/29/2023 CLINICAL DATA:  Fall, pain EXAM: LEFT TIBIA AND FIBULA - 2 VIEW COMPARISON:  None Available. FINDINGS: There is no evidence of fracture or other focal bone lesions. Soft tissues are unremarkable. IMPRESSION: No fracture or dislocation of the left tibia or fibula. Electronically Signed   By: Jearld Lesch M.D.   On: 01/29/2023 15:43   DG Knee 3  Views Left  Result Date: 01/29/2023 CLINICAL DATA:  Left knee pain after fall. EXAM: LEFT KNEE - 3 VIEW COMPARISON:  None Available. FINDINGS: No evidence of fracture, dislocation, or joint effusion. Minimal narrowing of medial joint space is noted with osteophyte formation. Soft tissues are unremarkable. IMPRESSION: Minimal degenerative  joint disease is noted medially. No acute abnormality seen. Electronically Signed   By: Lupita Raider M.D.   On: 01/29/2023 15:42   DG Foot 2 Views Left  Result Date: 01/28/2023 CLINICAL DATA:  Fall.  Pain over small toe. EXAM: LEFT ANKLE - 2 VIEW; LEFT FOOT - 2 VIEW COMPARISON:  None Available. FINDINGS: There is subtle step-off along the distal articular surface of the proximal phalanx of fifth toe. Correlate clinically for minimally displaced fracture. No other acute fracture or dislocation. No aggressive osseous lesion. Ankle mortise appears intact. There are mild degenerative changes of imaged joints. Calcaneal spur noted along the Achilles tendon and Plantar aponeurosis attachment sites. No focal soft tissue swelling. No radiopaque foreign bodies. IMPRESSION: 1. Subtle step-off along the distal articular surface of the proximal phalanx of fifth toe. Correlate clinically for minimally displaced fracture. Electronically Signed   By: Jules Schick M.D.   On: 01/28/2023 10:16   DG Ankle 2 Views Left  Result Date: 01/28/2023 CLINICAL DATA:  Fall.  Pain over small toe. EXAM: LEFT ANKLE - 2 VIEW; LEFT FOOT - 2 VIEW COMPARISON:  None Available. FINDINGS: There is subtle step-off along the distal articular surface of the proximal phalanx of fifth toe. Correlate clinically for minimally displaced fracture. No other acute fracture or dislocation. No aggressive osseous lesion. Ankle mortise appears intact. There are mild degenerative changes of imaged joints. Calcaneal spur noted along the Achilles tendon and Plantar aponeurosis attachment sites. No focal soft tissue swelling. No radiopaque foreign bodies. IMPRESSION: 1. Subtle step-off along the distal articular surface of the proximal phalanx of fifth toe. Correlate clinically for minimally displaced fracture. Electronically Signed   By: Jules Schick M.D.   On: 01/28/2023 10:16   DG Chest 2 View  Result Date: 01/28/2023 CLINICAL DATA:  Shortness of  breath EXAM: CHEST - 2 VIEW COMPARISON:  X-ray 01/15/2023 FINDINGS: Enlarged cardiopericardial silhouette. Prominence of the central vasculature. Bronchovascular crowding. No pneumothorax or effusion film is under penetrated. Overlapping cardiac leads. Right shoulder reverse arthroplasty identified. IMPRESSION: Enlarged heart. Prominence of the central vasculature but the lungs are underinflated. Under penetrated radiograph. Electronically Signed   By: Karen Kays M.D.   On: 01/28/2023 09:49   MR Pelvis W Wo Contrast  Result Date: 01/26/2023 CLINICAL DATA:  Endometrial cancer, for staging EXAM: MRI PELVIS WITHOUT AND WITH CONTRAST TECHNIQUE: Multiplanar multisequence MR imaging of the pelvis was performed both before and after administration of intravenous contrast. CONTRAST:  10mL GADAVIST GADOBUTROL 1 MMOL/ML IV SOLN COMPARISON:  CT abdomen/pelvis dated 01/13/2023. FINDINGS: Urinary Tract: Bladder is mildly thick-walled although underdistended. Bowel: Mild sigmoid diverticulosis, without associated inflammatory changes. Vascular/Lymphatic: No evidence of aneurysm. No suspicious pelvic lymphadenopathy. Reproductive: 3.9 x 3.2 x 2.5 cm endometrial mass (series 4/image 20), with suspected myometrial invasion along the posterior fundus (series 11/image 42), with suspected ~50% myometrial invasion. No involvement of the cervix. Bilateral ovaries are within normal limits. Other:  No pelvic ascites. Musculoskeletal: No focal osseous lesions. Subcutaneous edema/postprocedural changes in the bilateral gluteal region. IMPRESSION: 3.9 cm endometrial mass, with suspected 50% myometrial invasion, as above. This is considered borderline for FIGO stage IA/1B by MRI, pending histology. No evidence  of metastatic disease in the pelvis. Electronically Signed   By: Charline Bills M.D.   On: 01/26/2023 13:33   DG Chest Port 1 View  Result Date: 01/15/2023 CLINICAL DATA:  Chest pain EXAM: PORTABLE CHEST 1 VIEW COMPARISON:   01/12/2023 FINDINGS: Cardiac shadow is enlarged but stable. Lungs are well aerated bilaterally. Left basilar airspace opacity is noted. No other focal abnormality is noted. IMPRESSION: Left basilar airspace opacity. Electronically Signed   By: Alcide Clever M.D.   On: 01/15/2023 03:58   ECHOCARDIOGRAM COMPLETE  Result Date: 01/14/2023    ECHOCARDIOGRAM REPORT   Patient Name:   Amber Stephenson Shadix Date of Exam: 01/14/2023 Medical Rec #:  [id number]         Height:       62.0 in Accession #:    [id number]        Weight:       193.3 lb Date of Birth:  August 22, 1945          BSA:          1.884 m Patient Age:    77 years          BP:           112/71 mmHg Patient Gender: F                 HR:           77 bpm. Exam Location:  Inpatient Procedure: 2D Echo, Color Doppler and Cardiac Doppler Indications:    CHF-Acute Diastolic I50.31  History:        Patient has prior history of Echocardiogram examinations, most                 recent 11/10/2022. CHF and Cardiomegaly, CAD, Pulmonary HTN,                 Arrythmias:Atrial Fibrillation, Signs/Symptoms:Hypotension; Risk                 Factors:Diabetes, Non-Smoker, Hypertension, Sleep Apnea and                 Dyslipidemia.  Sonographer:    Aron Baba Referring Phys: [id number] Cyndi Bender  Sonographer Comments: Patient is obese and suboptimal parasternal window. Image acquisition challenging due to respiratory motion. IMPRESSIONS  1. Left ventricular ejection fraction, by estimation, is 60 to 65%. The left ventricle has normal function. The left ventricle has no regional wall motion abnormalities. There is mild concentric left ventricular hypertrophy. Left ventricular diastolic function could not be evaluated. Elevated left atrial pressure.  2. Right ventricular systolic function is normal. The right ventricular size is normal. Tricuspid regurgitation signal is inadequate for assessing PA pressure.  3. Left atrial size was severely dilated.  4. Right atrial size was mildly dilated.   5. The mitral valve is normal in structure. Trivial mitral valve regurgitation. No evidence of mitral stenosis. Moderate mitral annular calcification.  6. The aortic valve has an indeterminant number of cusps. Aortic valve regurgitation is mild. No aortic stenosis is present.  7. Aortic dilatation noted. There is mild dilatation of the aortic root, measuring 40 mm.  8. The inferior vena cava is dilated in size with <50% respiratory variability, suggesting right atrial pressure of 15 mmHg. FINDINGS  Left Ventricle: Left ventricular ejection fraction, by estimation, is 60 to 65%. The left ventricle has normal function. The left ventricle has no regional wall motion abnormalities. The left ventricular internal cavity size was normal in size. There  is  mild concentric left ventricular hypertrophy. Left ventricular diastolic function could not be evaluated due to atrial fibrillation. Left ventricular diastolic function could not be evaluated. Elevated left atrial pressure. Right Ventricle: The right ventricular size is normal. Right ventricular systolic function is normal. Tricuspid regurgitation signal is inadequate for assessing PA pressure. The tricuspid regurgitant velocity is 0.72 m/s, and with an assumed right atrial  pressure of 15 mmHg, the estimated right ventricular systolic pressure is 17.1 mmHg. Left Atrium: Left atrial size was severely dilated. Right Atrium: Right atrial size was mildly dilated. Pericardium: There is no evidence of pericardial effusion. Mitral Valve: The mitral valve is normal in structure. Moderate mitral annular calcification. Trivial mitral valve regurgitation. No evidence of mitral valve stenosis. Tricuspid Valve: The tricuspid valve is normal in structure. Tricuspid valve regurgitation is not demonstrated. No evidence of tricuspid stenosis. Aortic Valve: The aortic valve has an indeterminant number of cusps. Aortic valve regurgitation is mild. Aortic regurgitation PHT measures 682  msec. No aortic stenosis is present. Pulmonic Valve: The pulmonic valve was normal in structure. Pulmonic valve regurgitation is not visualized. No evidence of pulmonic stenosis. Aorta: Aortic dilatation noted. There is mild dilatation of the aortic root, measuring 40 mm. Venous: The inferior vena cava is dilated in size with less than 50% respiratory variability, suggesting right atrial pressure of 15 mmHg. IAS/Shunts: No atrial level shunt detected by color flow Doppler.  LEFT VENTRICLE PLAX 2D LVIDd:         4.60 cm   Diastology LVIDs:         3.20 cm   LV e' medial:    3.70 cm/s LV PW:         0.90 cm   LV E/e' medial:  34.1 LV IVS:        0.70 cm   LV e' lateral:   4.24 cm/s LVOT diam:     2.20 cm   LV E/e' lateral: 29.7 LV SV:         60 LV SV Index:   32 LVOT Area:     3.80 cm  RIGHT VENTRICLE RV S prime:     5.11 cm/s TAPSE (M-mode): 1.4 cm LEFT ATRIUM              Index        RIGHT ATRIUM           Index LA diam:        5.00 cm  2.65 cm/m   RA Area:     20.00 cm LA Vol (A2C):   154.0 ml 81.72 ml/m  RA Volume:   50.60 ml  26.85 ml/m LA Vol (A4C):   124.0 ml 65.80 ml/m LA Biplane Vol: 140.0 ml 74.29 ml/m  AORTIC VALVE LVOT Vmax:   82.00 cm/s LVOT Vmean:  56.300 cm/s LVOT VTI:    0.158 m AI PHT:      682 msec  AORTA Ao Root diam: 4.00 cm Ao Asc diam:  3.40 cm MITRAL VALVE                TRICUSPID VALVE MV Area (PHT): 3.99 cm     TR Peak grad:   2.1 mmHg MV Decel Time: 190 msec     TR Vmax:        71.70 cm/s MR Peak grad: 28.7 mmHg MR Vmax:      268.00 cm/s   SHUNTS MV E velocity: 126.00 cm/s  Systemic VTI:  0.16 m  Systemic Diam: 2.20 cm Olga Millers MD Electronically signed by Olga Millers MD Signature Date/Time: 01/14/2023/10:12:28 AM    Final    CT CHEST WO CONTRAST  Result Date: 01/13/2023 CLINICAL DATA:  Respiratory illness with nondiagnostic x-ray. History of endometrial cancer. EXAM: CT CHEST, ABDOMEN AND PELVIS WITHOUT CONTRAST TECHNIQUE: Multidetector CT  imaging of the chest, abdomen and pelvis was performed following the standard protocol without IV contrast. RADIATION DOSE REDUCTION: This exam was performed according to the departmental dose-optimization program which includes automated exposure control, adjustment of the mA and/or kV according to patient size and/or use of iterative reconstruction technique. COMPARISON:  Chest radiograph 01/12/2023 and 11/26/2022. CT chest 09/01/2022 FINDINGS: CT CHEST FINDINGS Cardiovascular: Mild cardiac enlargement. Calcification in the aortic and mitral valves. No pericardial effusions. Normal caliber thoracic aorta. Calcification of the aorta and coronary arteries. Mediastinum/Nodes: Esophagus is decompressed. Residual contrast material is demonstrated in the esophagus, possibly representing reflux or dysmotility. No significant lymphadenopathy. Thyroid gland is unremarkable. Lungs/Pleura: Patchy ground-glass infiltrates throughout both lungs in a mostly perihilar distribution. This could represent edema, multifocal pneumonia, or air trapping. No pleural effusions. No pneumothorax. Musculoskeletal: Postoperative right shoulder arthroplasty. Old rib fractures. Degenerative changes in the spine. Old appearing endplate compression deformities at T9, T11, and T12. No destructive bone lesions. CT ABDOMEN PELVIS FINDINGS Hepatobiliary: No focal liver abnormality is seen. No gallstones, gallbladder wall thickening, or biliary dilatation. Pancreas: Unremarkable. No pancreatic ductal dilatation or surrounding inflammatory changes. Spleen: Normal in size without focal abnormality. Adrenals/Urinary Tract: No adrenal gland nodules. Mild parenchymal atrophy in the kidneys. No hydronephrosis or hydroureter. No renal, ureteral, or bladder stones. Bladder is normal. Stomach/Bowel: Stomach, small bowel, and colon are not abnormally distended. Contrast material flows through to the cecum without evidence of bowel obstruction. No wall  thickening or inflammatory changes are appreciated. There is a duodenal diverticulum. The appendix is normal. Vascular/Lymphatic: Aortic atherosclerosis. No enlarged abdominal or pelvic lymph nodes. Reproductive: Uterus and bilateral adnexa are unremarkable. Other: No abdominal wall hernia or abnormality. No abdominopelvic ascites. Musculoskeletal: Mild endplate compression of L2 appears chronic. Degenerative changes in the spine. No destructive bone lesions. IMPRESSION: 1. Perihilar ground-glass infiltrates in the lungs likely representing edema or multifocal pneumonia. 2. No evidence of metastatic disease seen in the chest, abdomen, or pelvis on noncontrast imaging. 3. Aortic atherosclerosis 4. Multiple chronic appearing endplate compression deformities in the spine likely indicate osteoporosis. Electronically Signed   By: Burman Nieves M.D.   On: 01/13/2023 17:02   CT ABDOMEN PELVIS WO CONTRAST  Result Date: 01/13/2023 CLINICAL DATA:  Respiratory illness with nondiagnostic x-ray. History of endometrial cancer. EXAM: CT CHEST, ABDOMEN AND PELVIS WITHOUT CONTRAST TECHNIQUE: Multidetector CT imaging of the chest, abdomen and pelvis was performed following the standard protocol without IV contrast. RADIATION DOSE REDUCTION: This exam was performed according to the departmental dose-optimization program which includes automated exposure control, adjustment of the mA and/or kV according to patient size and/or use of iterative reconstruction technique. COMPARISON:  Chest radiograph 01/12/2023 and 11/26/2022. CT chest 09/01/2022 FINDINGS: CT CHEST FINDINGS Cardiovascular: Mild cardiac enlargement. Calcification in the aortic and mitral valves. No pericardial effusions. Normal caliber thoracic aorta. Calcification of the aorta and coronary arteries. Mediastinum/Nodes: Esophagus is decompressed. Residual contrast material is demonstrated in the esophagus, possibly representing reflux or dysmotility. No significant  lymphadenopathy. Thyroid gland is unremarkable. Lungs/Pleura: Patchy ground-glass infiltrates throughout both lungs in a mostly perihilar distribution. This could represent edema, multifocal pneumonia, or air trapping. No pleural effusions. No  pneumothorax. Musculoskeletal: Postoperative right shoulder arthroplasty. Old rib fractures. Degenerative changes in the spine. Old appearing endplate compression deformities at T9, T11, and T12. No destructive bone lesions. CT ABDOMEN PELVIS FINDINGS Hepatobiliary: No focal liver abnormality is seen. No gallstones, gallbladder wall thickening, or biliary dilatation. Pancreas: Unremarkable. No pancreatic ductal dilatation or surrounding inflammatory changes. Spleen: Normal in size without focal abnormality. Adrenals/Urinary Tract: No adrenal gland nodules. Mild parenchymal atrophy in the kidneys. No hydronephrosis or hydroureter. No renal, ureteral, or bladder stones. Bladder is normal. Stomach/Bowel: Stomach, small bowel, and colon are not abnormally distended. Contrast material flows through to the cecum without evidence of bowel obstruction. No wall thickening or inflammatory changes are appreciated. There is a duodenal diverticulum. The appendix is normal. Vascular/Lymphatic: Aortic atherosclerosis. No enlarged abdominal or pelvic lymph nodes. Reproductive: Uterus and bilateral adnexa are unremarkable. Other: No abdominal wall hernia or abnormality. No abdominopelvic ascites. Musculoskeletal: Mild endplate compression of L2 appears chronic. Degenerative changes in the spine. No destructive bone lesions. IMPRESSION: 1. Perihilar ground-glass infiltrates in the lungs likely representing edema or multifocal pneumonia. 2. No evidence of metastatic disease seen in the chest, abdomen, or pelvis on noncontrast imaging. 3. Aortic atherosclerosis 4. Multiple chronic appearing endplate compression deformities in the spine likely indicate osteoporosis. Electronically Signed   By:  Burman Nieves M.D.   On: 01/13/2023 17:02   DG Chest Port 1 View  Result Date: 01/12/2023 CLINICAL DATA:  shob EXAM: PORTABLE CHEST 1 VIEW COMPARISON:  CXR 11/26/22 FINDINGS: Pleural effusion. No pneumothorax. Unchanged cardiac and mediastinal contours. Compared to prior exam there are new hazy opacities in the bilateral mid lung fields, which could represent pulmonary edema or multifocal infection. No radiographically apparent displaced rib fractures. Visualized upper abdomen unremarkable. Right shoulder arthroplasty. IMPRESSION: Compared to prior exam there are new hazy opacities in the bilateral mid lung fields, which could represent pulmonary edema or multifocal infection. Electronically Signed   By: Lorenza Cambridge M.D.   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  We discussed the natural history of *** and general treatment, highlighting the role of radiotherapy in the management.  We discussed the available radiation techniques, and focused on the details of logistics and delivery.  We reviewed the anticipated acute and late sequelae associated with radiation in this setting.  The patient was encouraged to ask questions that I answered to the best of my ability. *** A patient consent form was discussed and signed.  We retained a copy for our records.  The patient would like to proceed with radiation and will be scheduled for CT simulation.  PLAN: ***    *** minutes of total time was spent for this patient encounter, including preparation, face-to-face counseling with the patient and coordination of care, physical exam, and documentation of the encounter.   ------------------------------------------------  Billie Lade, PhD, MD  This document serves as a record of  services personally performed by Antony Blackbird, MD. It was created on his behalf by Neena Rhymes, a trained medical scribe. The creation of this record is based on the scribe's personal observations and the provider's statements to them. This document has been checked and approved by the attending provider.

## 2023-02-10 NOTE — Telephone Encounter (Signed)
It is a side effect of medication usually. Drink extra water, unless swollen. Get up from seated or laying very slowly with something to hang onto to steady you. It usually will pass within a minute.

## 2023-02-10 NOTE — Telephone Encounter (Signed)
Patient aware.

## 2023-02-10 NOTE — Telephone Encounter (Signed)
Dwight husband would like CPAP machine order changed to [employer]. Dwight phone number is [phone].

## 2023-02-10 NOTE — Telephone Encounter (Signed)
Husband contacted and order placed.

## 2023-02-11 ENCOUNTER — Ambulatory Visit
Admission: RE | Admit: 2023-02-11 | Discharge: 2023-02-11 | Disposition: A | Discharge status: Home / Self Care | Payer: Medicare Other | Source: Ambulatory Visit | Attending: Radiation Oncology | Admitting: Radiation Oncology

## 2023-02-11 ENCOUNTER — Encounter: Payer: Self-pay | Admitting: Radiation Oncology

## 2023-02-11 VITALS — BP 109/92 | HR 86 | Temp 97.7°F | Resp 20 | Wt 191.0 lb

## 2023-02-11 DIAGNOSIS — Z794 Long term (current) use of insulin: Secondary | ICD-10-CM | POA: Insufficient documentation

## 2023-02-11 DIAGNOSIS — I5033 Acute on chronic diastolic (congestive) heart failure: Secondary | ICD-10-CM | POA: Insufficient documentation

## 2023-02-11 DIAGNOSIS — I2510 Atherosclerotic heart disease of native coronary artery without angina pectoris: Secondary | ICD-10-CM | POA: Insufficient documentation

## 2023-02-11 DIAGNOSIS — E1140 Type 2 diabetes mellitus with diabetic neuropathy, unspecified: Secondary | ICD-10-CM | POA: Insufficient documentation

## 2023-02-11 DIAGNOSIS — I083 Combined rheumatic disorders of mitral, aortic and tricuspid valves: Secondary | ICD-10-CM | POA: Diagnosis not present

## 2023-02-11 DIAGNOSIS — M8580 Other specified disorders of bone density and structure, unspecified site: Secondary | ICD-10-CM | POA: Insufficient documentation

## 2023-02-11 DIAGNOSIS — K760 Fatty (change of) liver, not elsewhere classified: Secondary | ICD-10-CM | POA: Insufficient documentation

## 2023-02-11 DIAGNOSIS — J9611 Chronic respiratory failure with hypoxia: Secondary | ICD-10-CM | POA: Diagnosis not present

## 2023-02-11 DIAGNOSIS — I4891 Unspecified atrial fibrillation: Secondary | ICD-10-CM | POA: Diagnosis not present

## 2023-02-11 DIAGNOSIS — E1136 Type 2 diabetes mellitus with diabetic cataract: Secondary | ICD-10-CM | POA: Insufficient documentation

## 2023-02-11 DIAGNOSIS — F319 Bipolar disorder, unspecified: Secondary | ICD-10-CM | POA: Insufficient documentation

## 2023-02-11 DIAGNOSIS — I700 Atherosclerosis of aorta: Secondary | ICD-10-CM | POA: Diagnosis not present

## 2023-02-11 DIAGNOSIS — Z7984 Long term (current) use of oral hypoglycemic drugs: Secondary | ICD-10-CM | POA: Insufficient documentation

## 2023-02-11 DIAGNOSIS — I272 Pulmonary hypertension, unspecified: Secondary | ICD-10-CM | POA: Diagnosis not present

## 2023-02-11 DIAGNOSIS — Z7951 Long term (current) use of inhaled steroids: Secondary | ICD-10-CM | POA: Diagnosis not present

## 2023-02-11 DIAGNOSIS — C541 Malignant neoplasm of endometrium: Secondary | ICD-10-CM | POA: Diagnosis not present

## 2023-02-11 DIAGNOSIS — G2581 Restless legs syndrome: Secondary | ICD-10-CM | POA: Insufficient documentation

## 2023-02-11 DIAGNOSIS — Z7901 Long term (current) use of anticoagulants: Secondary | ICD-10-CM | POA: Diagnosis not present

## 2023-02-11 DIAGNOSIS — R6881 Early satiety: Secondary | ICD-10-CM | POA: Insufficient documentation

## 2023-02-11 DIAGNOSIS — G4733 Obstructive sleep apnea (adult) (pediatric): Secondary | ICD-10-CM | POA: Diagnosis not present

## 2023-02-11 DIAGNOSIS — G894 Chronic pain syndrome: Secondary | ICD-10-CM | POA: Insufficient documentation

## 2023-02-11 DIAGNOSIS — R634 Abnormal weight loss: Secondary | ICD-10-CM | POA: Insufficient documentation

## 2023-02-11 DIAGNOSIS — Z9981 Dependence on supplemental oxygen: Secondary | ICD-10-CM | POA: Insufficient documentation

## 2023-02-11 DIAGNOSIS — I110 Hypertensive heart disease with heart failure: Secondary | ICD-10-CM | POA: Insufficient documentation

## 2023-02-11 DIAGNOSIS — Z79899 Other long term (current) drug therapy: Secondary | ICD-10-CM | POA: Insufficient documentation

## 2023-02-11 DIAGNOSIS — K5730 Diverticulosis of large intestine without perforation or abscess without bleeding: Secondary | ICD-10-CM | POA: Insufficient documentation

## 2023-02-12 ENCOUNTER — Ambulatory Visit (INDEPENDENT_AMBULATORY_CARE_PROVIDER_SITE_OTHER): Payer: Medicare Other | Admitting: Internal Medicine

## 2023-02-12 ENCOUNTER — Encounter: Payer: Self-pay | Admitting: Internal Medicine

## 2023-02-12 VITALS — BP 126/70 | HR 75 | Ht 62.0 in | Wt 191.0 lb

## 2023-02-12 DIAGNOSIS — Z7984 Long term (current) use of oral hypoglycemic drugs: Secondary | ICD-10-CM

## 2023-02-12 DIAGNOSIS — E1165 Type 2 diabetes mellitus with hyperglycemia: Secondary | ICD-10-CM | POA: Diagnosis not present

## 2023-02-12 DIAGNOSIS — Z794 Long term (current) use of insulin: Secondary | ICD-10-CM

## 2023-02-12 MED ORDER — OMNIPOD 5 G7 PODS (GEN 5) MISC: 1.0000 | 3 refills | Status: DC

## 2023-02-12 MED ORDER — OMNIPOD 5 G7 INTRO (GEN 5) KIT: 1.0000 | PACK | 0 refills | Status: DC

## 2023-02-12 NOTE — Patient Instructions (Addendum)
-   Increase Lantus 40 units twice daily  - Continue Mounjaro 7.5 mg weekly  - Increase  Humalog 6  units with each meal  -Humalog correctional insulin: ADD extra units on insulin to your meal-time Humalog  dose if your blood sugars are higher than 160. Use the scale below to help guide you before each meal   Blood sugar before meal Number of units to inject  Less than 155 0 unit  156 - 180 1 units  181 - 205 2 units  206 - 230 3 units  231 - 255 4 units  256 - 280 5 units  281 - 305 6 units  306 - 330 7 units  331 - 355 8 units  356 - 380 9 units   381 - 405 10 units        HOW TO TREAT LOW BLOOD SUGARS (Blood sugar LESS THAN 70 MG/DL) Please follow the RULE OF 15 for the treatment of hypoglycemia treatment (when your (blood sugars are less than 70 mg/dL)   STEP 1: Take 15 grams of carbohydrates when your blood sugar is low, which includes:  3-4 GLUCOSE TABS  OR 3-4 OZ OF JUICE OR REGULAR SODA OR ONE TUBE OF GLUCOSE GEL    STEP 2: RECHECK blood sugar in 15 MINUTES STEP 3: If your blood sugar is still low at the 15 minute recheck --> then, go back to STEP 1 and treat AGAIN with another 15 grams of carbohydrates.

## 2023-02-12 NOTE — Progress Notes (Signed)
Name: Amber Stephenson  Age/ Sex: 77 y.o., female   MRN/ DOB: [id number], [birth year]     PCP: Mechele Claude, MD   Reason for Endocrinology Evaluation: Type 2 Diabetes Mellitus  Initial Endocrine Consultative Visit: 09/24/2020    PATIENT IDENTIFIER: Amber Stephenson is a 77 y.o. female with a past medical history of T2DM, HTN, Dyslipidemia, OSA, CHF and A.Fib. The patient has followed with Endocrinology clinic since 09/24/2020 for consultative assistance with management of her diabetes.  DIABETIC HISTORY:  Amber Stephenson was diagnosed with DM in 2007, she has been on basal insulin for years, prandial insulin started 2016. GLP-1 agonists started in 2017. Metformin restarted 2017. Her hemoglobin A1c has ranged from 6.7% in 08/2019, peaking at 10.9% in 04/2020.  Saw Dr. Fransico Him in 2016 and Dr. Lucianne Muss in 2017   On her initial visit to our clinic she had an A1c 9.1% . We increased Metformin, Rybelsus and adjusted MDI regimen.    Jardiance discontinues 10/2021 due to recurrent genital skin infections and yeast infections   Switch Rybelsus to [facility] 05/2022  SUBJECTIVE:   During the last visit (12/08/2022): A1c 9.8%     Today (02/12/2023): Amber Stephenson is here for a follow up on diabetes management.  She is accompanied by her spouse today.  He injects insulin.  She checks her blood sugars multiple  times daily through the CGM. Marland Kitchen The patient has not had hypoglycemic episodes since the last clinic visit  Since her last visit here, the patient has been diagnosed with endometrial cancer, during evaluation of postmenopausal bleeding, pending radiation treatment   She presented to the ED for acute on chronic hypoxic respiratory failure secondary to CHF 01/2023                                She presented the end of August to the ED with a fracture involving the proximal phalanx of the fifth toe  She had a follow-up with cardiology 02/2023 She continues with vaginal bleeding She denies any GI  side effects to [facility] such as nausea, vomiting or changes in bowel movements   HOME DIABETES REGIMEN:  Metformin 500 mg 2 tabs daily  Mounjaro 7.5 mg weekly Lantus 35 units BID Humalog 4 units TIDQAC CF: Humalog (BG-130/30) TIDQAC     Statin: yes ACE-I/ARB: no Prior Diabetic Education: no    CONTINUOUS GLUCOSE MONITORING RECORD INTERPRETATION    Dates of Recording: 8/30-9/05/2023  Sensor description: dexcom   Results statistics:   CGM use % of time 93  Average and SD 328/74  Time in range 5%  % Time Above 180 15  % Time above 250 80  % Time Below target 0     Glycemic patterns summary: Her BG's are elevated during the day and night  Hyperglycemic episodes all day and night  Hypoglycemic episodes occurred  N/A  Overnight periods: Remain elevated      DIABETIC COMPLICATIONS: Microvascular complications:   Denies: CKD,  retinopathy , neuropathy ( she has this in the charts but denies neuropathy) Last eye exam: Completed 07/2020   Macrovascular complications:   Denies: CAD, PVD, CVA    HISTORY:  Past Medical History:  Past Medical History:  Diagnosis Date   Acquired hammer toe 12/06/2020   Acute metabolic encephalopathy 06/27/2022   Allergic rhinitis 02/24/2019   Ambulatory dysfunction 04/23/2020   Atrial fibrillation with RVR (HCC) 05/19/2017   Back pain/sacroiliitis--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   Benign paroxysmal positional vertigo due to bilateral vestibular disorder 05/20/2019   Bipolar 1 disorder (HCC) 01/23/2015   with GAD, benzo dependence   BPPV (benign paroxysmal positional vertigo) 05/20/2019   CAD (coronary artery disease)    Nonobstructive; Managed by Dr. Purvis Sheffield   Cardiomegaly 01/12/2018   CHF (congestive heart failure) 06/08/2022   Chronic back pain 01/04/2015   Chronic constipation 04/25/2020   Chronic diastolic  heart failure (HCC) 16/03/9603   Chronic pain syndrome 08/22/2019   back pain, sacroiliitis   Chronic pain syndrome 08/22/2019   Chronic post-traumatic stress disorder (PTSD) 12/06/2020   Diabetic neuropathy (HCC) 02/06/2016   Dyslipidemia 09/24/2020   Dyspnea on exertion    Essential hypertension    Family history of coronary arteriosclerosis 05/30/2015   Functional diarrhea 10/26/2020   Head trauma 09/17/2020   Hemorrhoids 03/11/2022   Herpes genitalis in women 07/16/2015   History of adenomatous polyp of colon 05/21/2019   Overview:   03/31/17: Colonoscopy: nonadvanced adenoma, microscopic colitis, f/u 5 yrs, [facility]/GAP   Insomnia 01/23/2015   Lipoma 02/08/2015   Migraine headache with aura 02/12/2016   Myofascial pain dysfunction syndrome 08/22/2019   Myofascial pain dysfunction syndrome 08/22/2019   Non-alcoholic fatty liver disease 01/12/2018   Opioid dependence (HCC) 12/06/2020   OSA (obstructive sleep apnea) 02/24/2019   10/09/2018 - HST  - AHI 40.6    Osteopenia 12/06/2020   Rx alendronate 35 mg.   Pinguecula 12/06/2020   Postcoital bleeding 12/06/2020   Presbyopia 12/06/2020   Pulmonary hypertension    RLS (restless legs syndrome) 04/27/2015   Superficial fungus infection of skin 09/03/2021   Tremor, essential 12/11/2021   Type II diabetes mellitus (HCC) 09/24/2020   Past Surgical History:  Past Surgical History:  Procedure Laterality Date   BREAST REDUCTION SURGERY     EYE SURGERY Right    cateracts   HAMMER TOE SURGERY     LEFT HEART CATH AND CORONARY ANGIOGRAPHY N/A 02/03/2018   Procedure: LEFT HEART CATH AND CORONARY ANGIOGRAPHY;  Surgeon: [name], Peter M, MD;  [facility]: [facility] INVASIVE CV LAB;  Service: Cardiovascular;  Laterality: N/A;   REVERSE SHOULDER ARTHROPLASTY Right 08/19/2019   Procedure: REVERSE SHOULDER ARTHROPLASTY;  Surgeon: Beverely Low, MD;  Location: [facility] ORS;  Service: Orthopedics;  Laterality: Right;  interscalene block   SHOULDER SURGERY Right     "I BROKE MY SHOUDLER   THIGH SURGERY     "TO REMOVE A TUMOR "   Social History:  reports that she has never smoked. She has never used smokeless tobacco. She reports that she does not currently use alcohol. She reports that she does not use drugs. Family History:  Family History  Problem Relation Age of Onset   Diabetes Mother    Heart disease Mother    Alzheimer's disease Father    Heart disease Sister        CABG   Diabetes Sister    Alcohol abuse Sister    Stroke Brother    Heart disease Brother    Mental illness Brother    Diabetes Brother    Breast cancer Neg Hx    Ovarian cancer Neg Hx    Colon cancer Neg Hx    Endometrial cancer Neg Hx      HOME MEDICATIONS: Allergies as of 02/12/2023       Reactions   Iodine Anaphylaxis  Ivp Dye [iodinated Contrast Media] Anaphylaxis, Swelling, Other (See Comments)   Throat closes   Latex Other (See Comments)   Latex tape pulls skin with it   Tape Other (See Comments)   Pulls off the skin, if latex   Tizanidine Other (See Comments)   Weakness, goofy, bad dreams        Medication List        Accurate as of February 12, 2023 11:31 AM. If you have any questions, ask your nurse or doctor.          albuterol 108 (90 Base) MCG/ACT inhaler Commonly known as: VENTOLIN HFA Inhale 2 puffs into the lungs every 6 (six) hours as needed for wheezing or shortness of breath.   allopurinol 100 MG tablet Commonly known as: ZYLOPRIM Take 100 mg by mouth daily.   apixaban 5 MG Tabs tablet Commonly known as: Eliquis Take 1 tablet (5 mg total) by mouth 2 (two) times daily.   ARIPiprazole 2 MG tablet Commonly known as: ABILIFY Take 2 mg by mouth at bedtime.   atorvastatin 80 MG tablet Commonly known as: LIPITOR Take 1 tablet (80 mg total) by mouth daily.   Belsomra 20 MG Tabs Generic drug: Suvorexant Take 20 mg by mouth at bedtime as needed (for sleep).   cyanocobalamin 1000 MCG tablet Commonly known as: VITAMIN  B12 Take 1 tablet (1,000 mcg total) by mouth daily.   Dexcom G7 Sensor Misc Change sensor every 10 days What changed:  how much to take how to take this when to take this additional instructions   doxepin 10 MG capsule Commonly known as: SINEQUAN Take 10 mg by mouth at bedtime.   Emgality 120 MG/ML Soaj Generic drug: Galcanezumab-gnlm INJECT CONTENTS ON 1 PEN(120MG ) INTO THE SKIN ONCE MONTHLY What changed:  how much to take how to take this when to take this additional instructions   escitalopram 10 MG tablet Commonly known as: LEXAPRO Take 10 mg by mouth daily.   fluticasone-salmeterol 100-50 MCG/ACT Aepb Commonly known as: Advair Diskus Inhale 1 puff into the lungs 2 (two) times daily. What changed:  when to take this reasons to take this   furosemide 20 MG tablet Commonly known as: LASIX Take 20 mg by mouth See admin instructions. Take 20 mg by mouth at 8 AM and 6 PM   insulin lispro 100 UNIT/ML KwikPen Commonly known as: HumaLOG KwikPen Max daily 50 units What changed:  how much to take how to take this when to take this additional instructions   Insulin Pen Needle 29G X Misc 1 Device by Does not apply route daily in the afternoon.   Lantus SoloStar 100 UNIT/ML Solostar Pen Generic drug: insulin glargine Inject 35 Units into the skin 2 (two) times daily.   LORazepam 0.5 MG tablet Commonly known as: ATIVAN Take 0.5-1 mg by mouth See admin instructions. Take 0.5 mg by mouth in the morning and 1 mg at bedtime   metFORMIN 500 MG 24 hr tablet Commonly known as: GLUCOPHAGE-XR Take 2 tablets (1,000 mg total) by mouth daily with breakfast.   metoprolol tartrate 50 MG tablet Commonly known as: LOPRESSOR Take 1 tablet (50 mg total) by mouth 2 (two) times daily.   morphine 15 MG 12 hr tablet Commonly known as: MS Contin Take 1 tablet (15 mg total) by mouth every 12 (twelve) hours. For chronic pain- can refill by 01/13/23   naloxone 4 MG/0.1ML Liqd  nasal spray kit Commonly known as: NARCAN Place 1  spray into the nose once as needed (AS DIRECTED).   norethindrone 5 MG tablet Commonly known as: AYGESTIN Take 2 tablets (10 mg total) by mouth daily.   nystatin ointment Commonly known as: MYCOSTATIN Apply 1 Application topically 2 (two) times daily. What changed:  when to take this reasons to take this   nystatin powder Commonly known as: MYCOSTATIN/NYSTOP Apply 1 Application topically as needed (rash). What changed: Another medication with the same name was changed. Make sure you understand how and when to take each.   ondansetron 8 MG disintegrating tablet Commonly known as: ZOFRAN-ODT Take 1 tablet (8 mg total) by mouth every 6 (six) hours as needed for nausea or vomiting.   OXcarbazepine 150 MG tablet Commonly known as: TRILEPTAL Take 1 tablet (150 mg total) by mouth 2 (two) times daily.   pantoprazole 40 MG tablet Commonly known as: PROTONIX Take 40 mg by mouth daily as needed (for reflux).   potassium chloride 10 MEQ tablet Commonly known as: KLOR-CON Take 1 tablet (10 mEq total) by mouth daily.   pregabalin 200 MG capsule Commonly known as: LYRICA Take 200 mg by mouth 2 (two) times daily.   Prolia 60 MG/ML Sosy injection Generic drug: denosumab Inject 60 mg into the skin every 6 (six) months.   senna-docusate 8.6-50 MG tablet Commonly known as: Senokot-S Take 1 tablet by mouth at bedtime. What changed:  when to take this reasons to take this   spironolactone 25 MG tablet Commonly known as: ALDACTONE Take 0.5 tablets (12.5 mg total) by mouth daily.   tirzepatide 7.5 MG/0.5ML Pen Commonly known as: MOUNJARO Inject 7.5 mg into the skin once a week.   TYLENOL 500 MG tablet Generic drug: acetaminophen Take 500-1,000 mg by mouth every 6 (six) hours as needed for mild pain or headache.   Vitamin D (Cholecalciferol) 25 MCG (1000 UT) Tabs Take 1,000 Units by mouth daily in the afternoon.          OBJECTIVE:   Vital Signs: BP 126/70 (BP Location: Left Arm, Patient Position: Sitting, Cuff Size: Large)   Pulse 75   Ht 5\' 2"  (1.575 m)   Wt 191 lb (86.6 kg)   SpO2 96%   BMI 34.93 kg/m   Wt Readings from Last 3 Encounters:  02/12/23 191 lb (86.6 kg)  02/11/23 191 lb (86.6 kg)  02/05/23 195 lb (88.5 kg)     Exam: General: Pt appears well and is in NAD  Lungs: Clear with good BS bilat   Heart: RRR   Extremities: No pretibial edema.   Neuro: MS is good with appropriate affect, pt is alert and Ox3    DM foot exam: 10/02/2022 per podiatry    The skin of the feet is without sores or ulcerations, left 2nd toe deformity  The pedal pulses are 1+ on right and 1+ on left. The sensation is intact to a screening 5.07, 10 gram monofilament bilaterally   DATA REVIEWED:  Lab Results  Component Value Date   HGBA1C 11.3 (H) 01/13/2023   HGBA1C 9.8 (A) 12/08/2022   HGBA1C 6.8 (A) 05/28/2022    Latest Reference Range & Units 02/03/23 15:33  Sodium 134 - 144 mmol/L 133 (L)  Potassium 3.5 - 5.2 mmol/L 5.6 (H)  Chloride 96 - 106 mmol/L 98  CO2 20 - 29 mmol/L 18 (L)  Glucose 70 - 99 mg/dL 161 (HH)  BUN 8 - 27 mg/dL 11  Creatinine 0.96 - 0.45 mg/dL 4.09  Calcium 8.7 - 10.3  mg/dL 8.8  BUN/Creatinine Ratio 12 - 28  14  eGFR >59 mL/min/1.73 78  Alkaline Phosphatase 44 - 121 IU/L 79  Albumin 3.8 - 4.8 g/dL 3.8  AST 0 - 40 IU/L 18  ALT 0 - 32 IU/L 9  Total Protein 6.0 - 8.5 g/dL 6.5  Total Bilirubin 0.0 - 1.2 mg/dL 0.5    Old records , labs and images have been reviewed.   ASSESSMENT / PLAN / RECOMMENDATIONS:   1) Type 2 Diabetes Mellitus, Poorly  controlled, Without complications - Most recent A1c of 11.3 %. Goal A1c < 7.0 %.    -Patient continues with worsening glycemic control despite escalating her insulin doses as well as Mounjaro -She has not started the higher dose of Mounjaro yet as this was just increased a couple days ago -In reviewing CGM data, patient  continues with hyperglycemia through the day and the night, not much evidence of decrease in glucose with insulin intake on CGM download, spouse assures me that he gives her insulin on a regular basis -We emphasized the importance of taking Humalog right before the meal -Today I have recommended insulin pump technology, we briefly discussed OmniPod, a prescription will be sent to her DME supplier and a referral to our CDE has been placed -She is already on Dexcom -In the meantime I will increase insulin as below -Patient is scheduled to have a BMP through cardiology, a printed order for C-peptide was given to the patient to have those labs fasting for insulin pump purposes  MEDICATIONS:  -Continue Metformin 500 mg XR, 2 tabs daily -Increase Lantus 40 units twice daily -Start Mounjaro 7.5 mg weekly -Increase Humalog 6 units TIDQAC - Change Correction factor: Humalog (BG -130/25) 3 times daily before every meal   EDUCATION / INSTRUCTIONS: BG monitoring instructions: Patient is instructed to check her blood sugars 3 times a day, before meals . Call [facility] if: BG persistently < 70 I reviewed the Rule of 15 for the treatment of hypoglycemia in detail with the patient. Literature supplied.   2) Diabetic complications:  Eye: Does not have known diabetic retinopathy.  Neuro/ Feet: Does not have known diabetic peripheral neuropathy .  Renal: Patient does not have known baseline CKD. She   is not on an ACEI/ARB at present.       F/U in 3 months     Signed electronically by: Lyndle Herrlich, MD  [facility]  [facility] [address] [place] [place], [place] [address] Phone: [phone] FAX: [phone]   CC: Mechele Claude, MD [address] [place] [address] Phone: [phone]  Fax: [phone]  Return to Endocrinology clinic as below: Future Appointments  Date Time Provider Department Center  02/13/2023  3:15 PM  Clinton Gallant, RN [facility] None  02/16/2023  9:20 AM Genice Rouge, MD CPR-PR[facility] CPR  02/19/2023 10:30 AM [facility]  02/19/2023  2:00 PM Antony Blackbird, MD [facility] None  03/02/2023  3:00 PM Antony Blackbird, MD [facility] None  03/03/2023 11:30 AM Marcos Eke, PA-C LBN-LBNG None  03/03/2023  2:45 PM [facility] LINAC 4 [facility] None  03/04/2023 10:45 AM [facility] LINAC 3 [facility] None  03/05/2023 10:45 AM [facility] LINAC 3 [facility] None  03/06/2023 11:00 AM [facility] [id number] [facility] None  03/09/2023 10:45 AM [facility] LINAC 3 [facility] None  03/10/2023 10:45 AM [facility] LINAC 3 [facility] None  03/11/2023 11:00 AM [facility] LINAC 4 [facility] None  03/12/2023 10:45 AM [facility] LINAC 3  [facility] None  03/13/2023 10:00 AM GI-315 CT 2 GI-315CT GI-[address]  03/13/2023 11:15 AM [facility] [id number] [facility] None  03/16/2023 10:45 AM [facility] LINAC 3 [facility] None  03/17/2023 10:45 AM [facility] LINAC 3 [facility] None  03/18/2023 10:45 AM [facility] LINAC 3 [facility] None  03/19/2023 10:45 AM [facility] LINAC 3 [facility] None  03/20/2023 10:45 AM [facility] LINAC 3 [facility] None  03/23/2023 10:45 AM [facility] LINAC 3 [facility] None  03/24/2023 10:45 AM [facility] LINAC 3 [facility] None  03/25/2023 10:45 AM [facility] LINAC 3 [facility] None  03/26/2023 10:45 AM [facility] LINAC 3 [facility] None  03/27/2023 10:45 AM [facility] LINAC 3 [facility] None  03/30/2023 10:45 AM [facility] LINAC 3 [facility] None  03/31/2023 10:45 AM [facility] LINAC 3 [facility] None  04/01/2023 10:45 AM [facility] LINAC 3 [facility] None  04/02/2023 10:45 AM [facility] LINAC 3 [facility] None  04/03/2023 10:45 AM [facility] LINAC 3 [facility] None  04/06/2023 10:45 AM [facility] LINAC 3 [facility] None  04/07/2023 10:45 AM [facility] LINAC 3 [facility] None  04/08/2023 10:45 AM [facility] LINAC 3  [facility] None  04/09/2023 10:45 AM [facility] LINAC 3 [facility] None  04/10/2023 10:45 AM [facility] LINAC 3 [facility] None  05/14/2023  8:10 AM [facility]  05/19/2023  1:30 PM Sharlene Dory, PA-C [facility]  05/22/2023  2:40 PM Van Seymore, Konrad Dolores, MD [facility] None  01/26/2024  8:30 AM [facility]-ANNUAL WELLNESS VISIT [facility] None

## 2023-02-13 ENCOUNTER — Other Ambulatory Visit: Payer: Self-pay | Admitting: Radiology

## 2023-02-13 ENCOUNTER — Telehealth: Payer: Self-pay

## 2023-02-13 ENCOUNTER — Encounter: Payer: Self-pay | Admitting: Gynecologic Oncology

## 2023-02-13 ENCOUNTER — Ambulatory Visit: Payer: Self-pay | Admitting: *Deleted

## 2023-02-13 DIAGNOSIS — N179 Acute kidney failure, unspecified: Secondary | ICD-10-CM | POA: Diagnosis not present

## 2023-02-13 DIAGNOSIS — I110 Hypertensive heart disease with heart failure: Secondary | ICD-10-CM | POA: Diagnosis not present

## 2023-02-13 DIAGNOSIS — I4821 Permanent atrial fibrillation: Secondary | ICD-10-CM | POA: Diagnosis not present

## 2023-02-13 DIAGNOSIS — I5033 Acute on chronic diastolic (congestive) heart failure: Secondary | ICD-10-CM | POA: Diagnosis not present

## 2023-02-13 DIAGNOSIS — J9621 Acute and chronic respiratory failure with hypoxia: Secondary | ICD-10-CM | POA: Diagnosis not present

## 2023-02-13 DIAGNOSIS — C541 Malignant neoplasm of endometrium: Secondary | ICD-10-CM | POA: Diagnosis not present

## 2023-02-13 NOTE — Telephone Encounter (Signed)
Called patient to make aware that Amber Stephenson advised her to go to [facility] ED for evaluation. Also made patient aware that CT sim appointment would be moved up to Monday. Patient voiced understanding.

## 2023-02-13 NOTE — Progress Notes (Signed)
Received phone call from Dr. Antony Blackbird today at 2:53 PM about patient.  Home health had reached out stating the patient was having heavier vaginal bleeding with clots.  The patient was recommended to go to the ER by Dr. Campbell Stall office.  Patient's current situation reviewed with Dr. Eugene Garnet.  Based on review it is recommended patient would be best to seek care at the [facility] ER due to her multiple comorbidities, heavy bleeding, proximity to her GYN oncology team/multiple disciplines.  Message sent to Benard Halsted, LPN for Dr. Roselind Messier. Our office attempted to reach patient as well and had to leave a message advising her of the above recommendation.

## 2023-02-13 NOTE — Patient Outreach (Signed)
Care Coordination   02/13/2023 Name: Amber Stephenson MRN: [id number] DOB: January 17, 1946   Care Coordination Outreach Attempts:  An unsuccessful telephone outreach was attempted for a scheduled appointment today.  Follow Up Plan:  Additional outreach attempts will be made to offer the patient care coordination information and services.   Encounter Outcome:  No Answer   Care Coordination Interventions:  No, not indicated    Duwane Gewirtz L. Noelle Penner, RN, BSN, CCM, Care Management Coordinator [phone]

## 2023-02-13 NOTE — Telephone Encounter (Signed)
Received a call from [employer] PT [facility] homecare). Per Tresa Endo patient has had severe bleeding soaking 2-5 pads daily and increased cramping that started on 02/12/23. Also reports fatigue, and weakness. Blood pressure was taken by Tresa Endo PT ( 100/62 HR 78). Patient to have CT sim appointment on 02/19/23. Pls advise

## 2023-02-13 NOTE — Progress Notes (Signed)
We were notified by PT that the patient has been experiencing severe vaginal bleeding and is soaking through 2-5 pads/day, an increase in abdominal cramping, worsening fatigue and weakness. Her BP was 100/62 and HR was 78.   I called the patient and shared the recommendation to go the the ER this afternoon. Patient expressed understanding and states she would go.   Dr. Roselind Messier was able to discuss her case with Warner Mccreedy NP and given her co-morbidities, they agreed she should go to [facility] ER to better manage her symptoms. We have made several attempts to call the patient to inform her of this change, but neither she nor her husband have answered to receive the message. We will continue to try to contact her.   Her CT simulation has been moved up to 02/16/23. We will continue to monitor this patient and may need to postpone her simulation, depending on her ER disposition.     Joyice Faster, PA-C

## 2023-02-16 ENCOUNTER — Ambulatory Visit: Payer: Medicare Other | Admitting: Radiation Oncology

## 2023-02-16 ENCOUNTER — Ambulatory Visit
Admission: RE | Admit: 2023-02-16 | Discharge: 2023-02-16 | Disposition: A | Discharge status: Home / Self Care | Payer: Medicare Other | Source: Ambulatory Visit | Attending: Radiation Oncology | Admitting: Radiation Oncology

## 2023-02-16 ENCOUNTER — Other Ambulatory Visit: Payer: Self-pay | Admitting: Internal Medicine

## 2023-02-16 ENCOUNTER — Encounter: Payer: Self-pay | Admitting: Physical Medicine and Rehabilitation

## 2023-02-16 ENCOUNTER — Encounter
Payer: Medicare Other | Attending: Physical Medicine and Rehabilitation | Admitting: Physical Medicine and Rehabilitation

## 2023-02-16 VITALS — BP 133/83 | HR 72 | Ht 62.0 in | Wt 194.0 lb

## 2023-02-16 DIAGNOSIS — R112 Nausea with vomiting, unspecified: Secondary | ICD-10-CM | POA: Insufficient documentation

## 2023-02-16 DIAGNOSIS — G894 Chronic pain syndrome: Secondary | ICD-10-CM | POA: Diagnosis not present

## 2023-02-16 DIAGNOSIS — C541 Malignant neoplasm of endometrium: Secondary | ICD-10-CM

## 2023-02-16 DIAGNOSIS — G8929 Other chronic pain: Secondary | ICD-10-CM | POA: Insufficient documentation

## 2023-02-16 DIAGNOSIS — M5441 Lumbago with sciatica, right side: Secondary | ICD-10-CM | POA: Diagnosis not present

## 2023-02-16 DIAGNOSIS — K5909 Other constipation: Secondary | ICD-10-CM | POA: Diagnosis present

## 2023-02-16 DIAGNOSIS — Z794 Long term (current) use of insulin: Secondary | ICD-10-CM | POA: Diagnosis not present

## 2023-02-16 DIAGNOSIS — E1165 Type 2 diabetes mellitus with hyperglycemia: Secondary | ICD-10-CM | POA: Diagnosis not present

## 2023-02-16 MED ORDER — ONDANSETRON HCL 4 MG PO TABS: 4.0000 mg | ORAL_TABLET | Freq: Three times a day (TID) | ORAL | 5 refills | Status: DC | PRN

## 2023-02-16 MED ORDER — PROMETHAZINE HCL 25 MG PO TABS
12.5000 mg | ORAL_TABLET | Freq: Four times a day (QID) | ORAL | 5 refills | Status: DC | PRN
Start: 2023-02-16 — End: 2023-10-07

## 2023-02-16 MED ORDER — METHOCARBAMOL 1000 MG PO TABS: 1000.0000 mg | ORAL_TABLET | Freq: Four times a day (QID) | ORAL | 5 refills | Status: DC | PRN

## 2023-02-16 MED ORDER — MORPHINE SULFATE ER 30 MG PO TBCR: 30.0000 mg | EXTENDED_RELEASE_TABLET | Freq: Two times a day (BID) | ORAL | 0 refills | Status: DC

## 2023-02-16 NOTE — Progress Notes (Signed)
Subjective:    Patient ID: Amber Stephenson, female    DOB: 1946-02-19, 77 y.o.   MRN: [id number]  HPI  Patient is a 77 yr old female with hx of  CAD, pulmonary HTN- and DM- with CKD- Stage III here for right low back pain with sciatica  As well as upper back/neck pain/myofascial pain. Pt is here for chronic pain f/u     Did prelim today and starting radiation Wednesday- 30 days of weekdays radiation.  Bleeding very heavily.  Actually has bled on paper.    Feels like MS Conitn not strong enough- been on pain meds a long time.  Not finding the relief as much as "should".   Doesn't like Zanaflex.  But would like Robaxin again.    On outer area of pain- now having muscle spasms on outer aspect- and esp with standing/walking.  Worst with standing.  Cannot stand to do chores/cooking.  Has to lay down or do HEP- 2x/day HEP.  Has person come to house to work on HEP.     Pain Inventory Average Pain 7 Pain Right Now 6 My pain is constant and aching  In the last 24 hours, has pain interfered with the following? General activity 8 Relation with others 2 Enjoyment of life 1 What TIME of day is your pain at its worst? daytime Sleep (in general) Poor  Pain is worse with: walking, bending, sitting, and standing Pain improves with: rest, heat/ice, therapy/exercise, medication, and TENS Relief from Meds: 7  Family History  Problem Relation Age of Onset   Diabetes Mother    Heart disease Mother    Alzheimer's disease Father    Heart disease Sister        CABG   Diabetes Sister    Alcohol abuse Sister    Stroke Brother    Heart disease Brother    Mental illness Brother    Diabetes Brother    Breast cancer Neg Hx    Ovarian cancer Neg Hx    Colon cancer Neg Hx    Endometrial cancer Neg Hx    Social History   Socioeconomic History   Marital status: Married    Spouse name: Financial planner   Number of children: 3   Years of education: 15   Highest education level: Associate  degree: academic program  Occupational History   Occupation: Retired    Comment: Chief Financial Officer  Tobacco Use   Smoking status: Never   Smokeless tobacco: Never  Vaping Use   Vaping status: Never Used  Substance and Sexual Activity   Alcohol use: Not Currently    Alcohol/week: 0.0 standard drinks of alcohol   Drug use: No   Sexual activity: Yes    Partners: Male    Birth control/protection: Post-menopausal  Other Topics Concern   Not on file  Social History Narrative   Lives at home with husband.    They have 3 children who live away - [place], [place], and [place].   She is from [place] and most of her family lives there.   Her husbands's family live nearby   Right handed.   Social Determinants of Health   Financial Resource Strain: Low Risk  (01/23/2023)   Overall Financial Resource Strain (CARDIA)    Difficulty of Paying Living Expenses: Not hard at all  Food Insecurity: No Food Insecurity (02/03/2023)   Hunger Vital Sign    Worried About Running Out of Food in the Last Year: Never true    Ran  Out of Food in the Last Year: Never true  Transportation Needs: No Transportation Needs (02/03/2023)   PRAPARE - Administrator, Civil Service (Medical): No    Lack of Transportation (Non-Medical): No  Physical Activity: Inactive (01/23/2023)   Exercise Vital Sign    Days of Exercise per Week: 0 days    Minutes of Exercise per Session: 0 min  Stress: No Stress Concern Present (01/23/2023)   [employer] of Occupational Health - Occupational Stress Questionnaire    Feeling of Stress : Not at all  Social Connections: Moderately Integrated (01/23/2023)   Social Connection and Isolation Panel [NHANES]    Frequency of Communication with Friends and Family: More than three times a week    Frequency of Social Gatherings with Friends and Family: More than three times a week    Attends Religious Services: More than 4 times per year    Active Member of [employer] or Organizations:  No    Attends Engineer, structural: Never    Marital Status: Married   Past Surgical History:  Procedure Laterality Date   BREAST REDUCTION SURGERY     EYE SURGERY Right    cateracts   HAMMER TOE SURGERY     LEFT HEART CATH AND CORONARY ANGIOGRAPHY N/A 02/03/2018   Procedure: LEFT HEART CATH AND CORONARY ANGIOGRAPHY;  Surgeon: [name], Peter M, MD;  Location: [facility];  Service: Cardiovascular;  Laterality: N/A;   REVERSE SHOULDER ARTHROPLASTY Right 08/19/2019   Procedure: REVERSE SHOULDER ARTHROPLASTY;  Surgeon: Beverely Low, MD;  Location: [facility];  Service: Orthopedics;  Laterality: Right;  interscalene block   SHOULDER SURGERY Right    "I BROKE MY SHOUDLER   THIGH SURGERY     "TO REMOVE A TUMOR "   Past Surgical History:  Procedure Laterality Date   BREAST REDUCTION SURGERY     EYE SURGERY Right    cateracts   HAMMER TOE SURGERY     LEFT HEART CATH AND CORONARY ANGIOGRAPHY N/A 02/03/2018   Procedure: LEFT HEART CATH AND CORONARY ANGIOGRAPHY;  Surgeon: [name], Peter M, MD;  Location: [facility];  Service: Cardiovascular;  Laterality: N/A;   REVERSE SHOULDER ARTHROPLASTY Right 08/19/2019   Procedure: REVERSE SHOULDER ARTHROPLASTY;  Surgeon: Beverely Low, MD;  Location: [facility];  Service: Orthopedics;  Laterality: Right;  interscalene block   SHOULDER SURGERY Right    "I BROKE MY SHOUDLER   THIGH SURGERY     "TO REMOVE A TUMOR "   Past Medical History:  Diagnosis Date   Acquired hammer toe 12/06/2020   Acute metabolic encephalopathy 06/27/2022   Allergic rhinitis 02/24/2019   Ambulatory dysfunction 04/23/2020   Atrial fibrillation with RVR (HCC) 05/19/2017   Back pain/sacroiliitis--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   Benign paroxysmal positional vertigo due to bilateral vestibular disorder 05/20/2019   Bipolar 1 disorder (HCC) 01/23/2015   with GAD,  benzo dependence   BPPV (benign paroxysmal positional vertigo) 05/20/2019   CAD (coronary artery disease)    Nonobstructive; Managed by Dr. Purvis Sheffield   Cardiomegaly 01/12/2018   CHF (congestive heart failure) 06/08/2022   Chronic back pain 01/04/2015   Chronic constipation 04/25/2020   Chronic diastolic heart failure (HCC) 05/30/2015   Chronic pain syndrome 08/22/2019   back pain, sacroiliitis   Chronic pain syndrome 08/22/2019   Chronic post-traumatic stress disorder (PTSD) 12/06/2020   Diabetic  neuropathy (HCC) 02/06/2016   Dyslipidemia 09/24/2020   Dyspnea on exertion    Essential hypertension    Family history of coronary arteriosclerosis 05/30/2015   Functional diarrhea 10/26/2020   Head trauma 09/17/2020   Hemorrhoids 03/11/2022   Herpes genitalis in women 07/16/2015   History of adenomatous polyp of colon 05/21/2019   Overview:   03/31/17: Colonoscopy: nonadvanced adenoma, microscopic colitis, f/u 5 yrs, [facility]/GAP   Insomnia 01/23/2015   Lipoma 02/08/2015   Migraine headache with aura 02/12/2016   Myofascial pain dysfunction syndrome 08/22/2019   Myofascial pain dysfunction syndrome 08/22/2019   Non-alcoholic fatty liver disease 01/12/2018   Opioid dependence (HCC) 12/06/2020   OSA (obstructive sleep apnea) 02/24/2019   10/09/2018 - HST  - AHI 40.6    Osteopenia 12/06/2020   Rx alendronate 35 mg.   Pinguecula 12/06/2020   Postcoital bleeding 12/06/2020   Presbyopia 12/06/2020   Pulmonary hypertension    RLS (restless legs syndrome) 04/27/2015   Superficial fungus infection of skin 09/03/2021   Tremor, essential 12/11/2021   Type II diabetes mellitus (HCC) 09/24/2020   BP 133/83   Pulse 72   Ht 5\' 2"  (1.575 m)   Wt 194 lb (88 kg)   SpO2 93%   BMI 35.48 kg/m   Opioid Risk Score:   Fall Risk Score:  `1  Depression screen PHQ 2/9     01/23/2023    1:06 PM 01/07/2023    9:15 AM 12/22/2022    1:51 PM 11/26/2022    3:09 PM 11/14/2022    2:22 PM 10/22/2022     8:20 AM 10/08/2022    1:19 PM  Depression screen PHQ 2/9  Decreased Interest 0 0 0 0 0 0 0  Down, Depressed, Hopeless 0 0 0 0 0 0 0  PHQ - 2 Score 0 0 0 0 0 0 0  Altered sleeping     0 0   Tired, decreased energy     0 0   Change in appetite     0 0   Feeling bad or failure about yourself      0 0   Trouble concentrating     0 0   Moving slowly or fidgety/restless     0 0   Suicidal thoughts     0 0   PHQ-9 Score     0 0   Difficult doing work/chores     Not difficult at all Not difficult at all       Review of Systems  Musculoskeletal:        RT hip pain  All other systems reviewed and are negative.      Objective:   Physical Exam  Awake, appears fatigued and a little SOB; bleeding onto bed, NAD TTP much more than normal on R low back- large palpable spasm      Assessment & Plan:    Patient is a 77 yr old female with hx of  CAD, pulmonary HTN- and DM- with CKD- Stage III here for right low back pain with sciatica  As well as upper back/neck pain/myofascial pain. Pt is here for chronic pain f/u    Since having cancer related Nausea- will add Phenergan/promethazine  12.5 to 25 mg q6 hours prn- # 120- 5 refills-can cause sleepiness 2. Will also give Zofran, ondansetron 4 mg up to 4x/day as needed vs scheduled for nausea- can cause constipation- #120- 5 refills    3. Having more pain- with new CA dx-  will increase MS Contin to 30 mg 2x/day for pain- #60 no refills - call me at 2- 3 weeks or so to let me know how it works.    4. Will add Robaxin/Methocarbamol 1000 mg 4x day as needed for muscle spasms.    5. Starting treat/radiation- Wednesday for endometrial cancer.  Still contained within uterine walls per husband.   6. F/U - 2 months- f/u on chronic pain.   7. Uses miralax almost daily- I'd go 1.5 doses every you take it.     I spent a total of  31  minutes on total care today- >50% coordination of care- due to d/w pt about anusea related ot cancer, fatigue  related to radiation, and increasing pain meds- and adding meds for muscle spasms and pain, nausea.

## 2023-02-16 NOTE — Patient Instructions (Signed)
  Patient is a 77 yr old female with hx of  CAD, pulmonary HTN- and DM- with CKD- Stage III here for right low back pain with sciatica  As well as upper back/neck pain/myofascial pain. Pt is here for chronic pain f/u    Since having cancer related Nausea- will add Phenergan/promethazine  12.5 to 25 mg q6 hours prn- # 120- 5 refills-can cause sleepiness 2. Will also give Zofran, ondansetron 4 mg up to 4x/day as needed vs scheduled for nausea- can cause constipation- #120- 5 refills    3. Having more pain- with new CA dx- will increase MS Contin to 30 mg 2x/day for pain- #60 no refills - call me at 2- 3 weeks or so to let me know how it works.    4. Will add Robaxin/Methocarbamol 1000 mg 4x day as needed for muscle spasms.    5. Starting treat/radiation- Wednesday for endometrial cancer.  Still contained within uterine walls per husband.   6. F/U - 2 months- f/u on chronic pain.   7. Uses miralax almost daily- I'd go 1.5 doses every you take it.

## 2023-02-17 ENCOUNTER — Ambulatory Visit: Payer: Medicare Other

## 2023-02-17 DIAGNOSIS — C541 Malignant neoplasm of endometrium: Secondary | ICD-10-CM | POA: Diagnosis not present

## 2023-02-17 DIAGNOSIS — I4821 Permanent atrial fibrillation: Secondary | ICD-10-CM | POA: Diagnosis not present

## 2023-02-17 DIAGNOSIS — Z510 Encounter for antineoplastic radiation therapy: Secondary | ICD-10-CM | POA: Insufficient documentation

## 2023-02-17 DIAGNOSIS — N179 Acute kidney failure, unspecified: Secondary | ICD-10-CM | POA: Diagnosis not present

## 2023-02-17 DIAGNOSIS — I110 Hypertensive heart disease with heart failure: Secondary | ICD-10-CM | POA: Diagnosis not present

## 2023-02-17 DIAGNOSIS — J9621 Acute and chronic respiratory failure with hypoxia: Secondary | ICD-10-CM | POA: Diagnosis not present

## 2023-02-17 DIAGNOSIS — I5033 Acute on chronic diastolic (congestive) heart failure: Secondary | ICD-10-CM | POA: Diagnosis not present

## 2023-02-17 LAB — C-PEPTIDE: C-Peptide: 2.7 ng/mL (ref 1.1–4.4)

## 2023-02-18 ENCOUNTER — Ambulatory Visit
Admission: RE | Admit: 2023-02-18 | Discharge: 2023-02-18 | Disposition: A | Discharge status: Home / Self Care | Payer: Medicare Other | Source: Ambulatory Visit | Attending: Radiation Oncology | Admitting: Radiation Oncology

## 2023-02-18 ENCOUNTER — Telehealth: Payer: Self-pay | Admitting: *Deleted

## 2023-02-18 ENCOUNTER — Ambulatory Visit: Payer: Medicare Other

## 2023-02-18 ENCOUNTER — Other Ambulatory Visit: Payer: Self-pay

## 2023-02-18 DIAGNOSIS — C541 Malignant neoplasm of endometrium: Secondary | ICD-10-CM

## 2023-02-18 DIAGNOSIS — Z510 Encounter for antineoplastic radiation therapy: Secondary | ICD-10-CM | POA: Diagnosis not present

## 2023-02-18 LAB — RAD ONC ARIA SESSION SUMMARY
Course Elapsed Days: 0
Plan Fractions Treated to Date: 1
Plan Prescribed Dose Per Fraction: 1.8 Gy
Plan Total Fractions Prescribed: 25
Plan Total Prescribed Dose: 45 Gy
Reference Point Dosage Given to Date: 1.8 Gy
Reference Point Session Dosage Given: 1.8 Gy
Session Number: 1

## 2023-02-18 LAB — CBC WITH DIFFERENTIAL (CANCER CENTER ONLY)
Abs Immature Granulocytes: 0.04 10*3/uL (ref 0.00–0.07)
Basophils Absolute: 0.1 10*3/uL (ref 0.0–0.1)
Basophils Relative: 1 %
Eosinophils Absolute: 0.1 10*3/uL (ref 0.0–0.5)
Eosinophils Relative: 1 %
HCT: 37.7 % (ref 36.0–46.0)
Hemoglobin: 12 g/dL (ref 12.0–15.0)
Immature Granulocytes: 1 %
Lymphocytes Relative: 30 %
Lymphs Abs: 2.6 10*3/uL (ref 0.7–4.0)
MCH: 28.1 pg (ref 26.0–34.0)
MCHC: 31.8 g/dL (ref 30.0–36.0)
MCV: 88.3 fL (ref 80.0–100.0)
Monocytes Absolute: 0.7 10*3/uL (ref 0.1–1.0)
Monocytes Relative: 8 %
Neutro Abs: 5.3 10*3/uL (ref 1.7–7.7)
Neutrophils Relative %: 59 %
Platelet Count: 262 10*3/uL (ref 150–400)
RBC: 4.27 MIL/uL (ref 3.87–5.11)
RDW: 14.7 % (ref 11.5–15.5)
WBC Count: 8.8 10*3/uL (ref 4.0–10.5)
nRBC: 0 % (ref 0.0–0.2)

## 2023-02-18 NOTE — Progress Notes (Signed)
Care Coordination Note  02/18/2023 Name: Amber Stephenson MRN: [id number] DOB: 1945-12-19  Amber Stephenson is a 77 y.o. year old female who is a primary care patient of Stacks, Broadus John, MD and is actively engaged with the care management team. I reached out to [name] by phone today to assist with re-scheduling a follow up visit with the RN Case Manager  Follow up plan: Unsuccessful telephone outreach attempt made. A HIPAA compliant phone message was left for the patient providing contact information and requesting a return call.   [facility]  Care Coordination Care Guide  Direct Dial: [phone]

## 2023-02-19 ENCOUNTER — Ambulatory Visit: Payer: Medicare Other | Admitting: Radiation Oncology

## 2023-02-19 ENCOUNTER — Other Ambulatory Visit: Payer: Self-pay

## 2023-02-19 ENCOUNTER — Ambulatory Visit
Admission: RE | Admit: 2023-02-19 | Discharge: 2023-02-19 | Disposition: A | Discharge status: Home / Self Care | Payer: Medicare Other | Source: Ambulatory Visit | Attending: Radiation Oncology | Admitting: Radiation Oncology

## 2023-02-19 ENCOUNTER — Ambulatory Visit: Payer: Medicare Other | Attending: Physician Assistant

## 2023-02-19 ENCOUNTER — Ambulatory Visit: Payer: Medicare Other

## 2023-02-19 DIAGNOSIS — I482 Chronic atrial fibrillation, unspecified: Secondary | ICD-10-CM

## 2023-02-19 DIAGNOSIS — C541 Malignant neoplasm of endometrium: Secondary | ICD-10-CM | POA: Diagnosis not present

## 2023-02-19 DIAGNOSIS — Z510 Encounter for antineoplastic radiation therapy: Secondary | ICD-10-CM | POA: Diagnosis not present

## 2023-02-19 LAB — RAD ONC ARIA SESSION SUMMARY
Course Elapsed Days: 1
Plan Fractions Treated to Date: 2
Plan Prescribed Dose Per Fraction: 1.8 Gy
Plan Total Fractions Prescribed: 25
Plan Total Prescribed Dose: 45 Gy
Reference Point Dosage Given to Date: 3.6 Gy
Reference Point Session Dosage Given: 1.8 Gy
Session Number: 2

## 2023-02-20 ENCOUNTER — Other Ambulatory Visit: Payer: Self-pay

## 2023-02-20 ENCOUNTER — Ambulatory Visit
Admission: RE | Admit: 2023-02-20 | Discharge: 2023-02-20 | Disposition: A | Discharge status: Home / Self Care | Payer: Medicare Other | Source: Ambulatory Visit | Attending: Radiation Oncology | Admitting: Radiation Oncology

## 2023-02-20 DIAGNOSIS — N179 Acute kidney failure, unspecified: Secondary | ICD-10-CM | POA: Diagnosis not present

## 2023-02-20 DIAGNOSIS — I4821 Permanent atrial fibrillation: Secondary | ICD-10-CM | POA: Diagnosis not present

## 2023-02-20 DIAGNOSIS — Z510 Encounter for antineoplastic radiation therapy: Secondary | ICD-10-CM | POA: Diagnosis not present

## 2023-02-20 DIAGNOSIS — J9621 Acute and chronic respiratory failure with hypoxia: Secondary | ICD-10-CM | POA: Diagnosis not present

## 2023-02-20 DIAGNOSIS — C541 Malignant neoplasm of endometrium: Secondary | ICD-10-CM | POA: Diagnosis not present

## 2023-02-20 DIAGNOSIS — I5033 Acute on chronic diastolic (congestive) heart failure: Secondary | ICD-10-CM | POA: Diagnosis not present

## 2023-02-20 DIAGNOSIS — I110 Hypertensive heart disease with heart failure: Secondary | ICD-10-CM | POA: Diagnosis not present

## 2023-02-20 LAB — RAD ONC ARIA SESSION SUMMARY
Course Elapsed Days: 2
Plan Fractions Treated to Date: 3
Plan Prescribed Dose Per Fraction: 1.8 Gy
Plan Total Fractions Prescribed: 25
Plan Total Prescribed Dose: 45 Gy
Reference Point Dosage Given to Date: 5.4 Gy
Reference Point Session Dosage Given: 1.8 Gy
Session Number: 3

## 2023-02-20 LAB — BASIC METABOLIC PANEL
BUN/Creatinine Ratio: 30 — ABNORMAL HIGH (ref 12–28)
BUN: 29 mg/dL — ABNORMAL HIGH (ref 8–27)
CO2: 22 mmol/L (ref 20–29)
Calcium: 8.6 mg/dL — ABNORMAL LOW (ref 8.7–10.3)
Chloride: 95 mmol/L — ABNORMAL LOW (ref 96–106)
Creatinine, Ser: 0.98 mg/dL (ref 0.57–1.00)
Glucose: 294 mg/dL — ABNORMAL HIGH (ref 70–99)
Potassium: 4.8 mmol/L (ref 3.5–5.2)
Sodium: 132 mmol/L — ABNORMAL LOW (ref 134–144)
eGFR: 60 mL/min/{1.73_m2} (ref >59)

## 2023-02-22 DIAGNOSIS — Z7984 Long term (current) use of oral hypoglycemic drugs: Secondary | ICD-10-CM | POA: Diagnosis not present

## 2023-02-22 DIAGNOSIS — E669 Obesity, unspecified: Secondary | ICD-10-CM | POA: Diagnosis not present

## 2023-02-22 DIAGNOSIS — Z7901 Long term (current) use of anticoagulants: Secondary | ICD-10-CM | POA: Diagnosis not present

## 2023-02-22 DIAGNOSIS — C541 Malignant neoplasm of endometrium: Secondary | ICD-10-CM | POA: Diagnosis not present

## 2023-02-22 DIAGNOSIS — E119 Type 2 diabetes mellitus without complications: Secondary | ICD-10-CM | POA: Diagnosis not present

## 2023-02-22 DIAGNOSIS — I4821 Permanent atrial fibrillation: Secondary | ICD-10-CM | POA: Diagnosis not present

## 2023-02-22 DIAGNOSIS — F319 Bipolar disorder, unspecified: Secondary | ICD-10-CM | POA: Diagnosis not present

## 2023-02-22 DIAGNOSIS — I083 Combined rheumatic disorders of mitral, aortic and tricuspid valves: Secondary | ICD-10-CM | POA: Diagnosis not present

## 2023-02-22 DIAGNOSIS — S92512D Displaced fracture of proximal phalanx of left lesser toe(s), subsequent encounter for fracture with routine healing: Secondary | ICD-10-CM | POA: Diagnosis not present

## 2023-02-22 DIAGNOSIS — Z9981 Dependence on supplemental oxygen: Secondary | ICD-10-CM | POA: Diagnosis not present

## 2023-02-22 DIAGNOSIS — I110 Hypertensive heart disease with heart failure: Secondary | ICD-10-CM | POA: Diagnosis not present

## 2023-02-22 DIAGNOSIS — I700 Atherosclerosis of aorta: Secondary | ICD-10-CM | POA: Diagnosis not present

## 2023-02-22 DIAGNOSIS — E8809 Other disorders of plasma-protein metabolism, not elsewhere classified: Secondary | ICD-10-CM | POA: Diagnosis not present

## 2023-02-22 DIAGNOSIS — F419 Anxiety disorder, unspecified: Secondary | ICD-10-CM | POA: Diagnosis not present

## 2023-02-22 DIAGNOSIS — M439 Deforming dorsopathy, unspecified: Secondary | ICD-10-CM | POA: Diagnosis not present

## 2023-02-22 DIAGNOSIS — I5033 Acute on chronic diastolic (congestive) heart failure: Secondary | ICD-10-CM | POA: Diagnosis not present

## 2023-02-22 DIAGNOSIS — J9611 Chronic respiratory failure with hypoxia: Secondary | ICD-10-CM | POA: Diagnosis not present

## 2023-02-22 DIAGNOSIS — Z6835 Body mass index (BMI) 35.0-35.9, adult: Secondary | ICD-10-CM | POA: Diagnosis not present

## 2023-02-22 DIAGNOSIS — Z7951 Long term (current) use of inhaled steroids: Secondary | ICD-10-CM | POA: Diagnosis not present

## 2023-02-22 DIAGNOSIS — I2510 Atherosclerotic heart disease of native coronary artery without angina pectoris: Secondary | ICD-10-CM | POA: Diagnosis not present

## 2023-02-22 DIAGNOSIS — M1712 Unilateral primary osteoarthritis, left knee: Secondary | ICD-10-CM | POA: Diagnosis not present

## 2023-02-22 DIAGNOSIS — E7800 Pure hypercholesterolemia, unspecified: Secondary | ICD-10-CM | POA: Diagnosis not present

## 2023-02-22 DIAGNOSIS — Z794 Long term (current) use of insulin: Secondary | ICD-10-CM | POA: Diagnosis not present

## 2023-02-22 DIAGNOSIS — G8929 Other chronic pain: Secondary | ICD-10-CM | POA: Diagnosis not present

## 2023-02-22 DIAGNOSIS — Z7985 Long-term (current) use of injectable non-insulin antidiabetic drugs: Secondary | ICD-10-CM | POA: Diagnosis not present

## 2023-02-23 ENCOUNTER — Other Ambulatory Visit: Payer: Self-pay

## 2023-02-23 ENCOUNTER — Ambulatory Visit
Admission: RE | Admit: 2023-02-23 | Discharge: 2023-02-23 | Disposition: A | Discharge status: Home / Self Care | Payer: Medicare Other | Source: Ambulatory Visit | Attending: Radiation Oncology | Admitting: Radiation Oncology

## 2023-02-23 DIAGNOSIS — Z510 Encounter for antineoplastic radiation therapy: Secondary | ICD-10-CM | POA: Diagnosis not present

## 2023-02-23 DIAGNOSIS — F251 Schizoaffective disorder, depressive type: Secondary | ICD-10-CM | POA: Diagnosis not present

## 2023-02-23 DIAGNOSIS — C541 Malignant neoplasm of endometrium: Secondary | ICD-10-CM | POA: Diagnosis not present

## 2023-02-23 LAB — RAD ONC ARIA SESSION SUMMARY
Course Elapsed Days: 5
Plan Fractions Treated to Date: 4
Plan Prescribed Dose Per Fraction: 1.8 Gy
Plan Total Fractions Prescribed: 25
Plan Total Prescribed Dose: 45 Gy
Reference Point Dosage Given to Date: 7.2 Gy
Reference Point Session Dosage Given: 1.8 Gy
Session Number: 4

## 2023-02-24 ENCOUNTER — Other Ambulatory Visit: Payer: Self-pay

## 2023-02-24 ENCOUNTER — Ambulatory Visit
Admission: RE | Admit: 2023-02-24 | Discharge: 2023-02-24 | Disposition: A | Discharge status: Home / Self Care | Payer: Medicare Other | Source: Ambulatory Visit | Attending: Radiation Oncology | Admitting: Radiation Oncology

## 2023-02-24 ENCOUNTER — Ambulatory Visit: Payer: Medicare Other

## 2023-02-24 DIAGNOSIS — I110 Hypertensive heart disease with heart failure: Secondary | ICD-10-CM | POA: Diagnosis not present

## 2023-02-24 DIAGNOSIS — C541 Malignant neoplasm of endometrium: Secondary | ICD-10-CM | POA: Diagnosis not present

## 2023-02-24 DIAGNOSIS — S92512D Displaced fracture of proximal phalanx of left lesser toe(s), subsequent encounter for fracture with routine healing: Secondary | ICD-10-CM | POA: Diagnosis not present

## 2023-02-24 DIAGNOSIS — I4821 Permanent atrial fibrillation: Secondary | ICD-10-CM | POA: Diagnosis not present

## 2023-02-24 DIAGNOSIS — I5033 Acute on chronic diastolic (congestive) heart failure: Secondary | ICD-10-CM | POA: Diagnosis not present

## 2023-02-24 DIAGNOSIS — J9611 Chronic respiratory failure with hypoxia: Secondary | ICD-10-CM | POA: Diagnosis not present

## 2023-02-24 DIAGNOSIS — Z510 Encounter for antineoplastic radiation therapy: Secondary | ICD-10-CM | POA: Diagnosis not present

## 2023-02-24 LAB — RAD ONC ARIA SESSION SUMMARY
Course Elapsed Days: 6
Plan Fractions Treated to Date: 5
Plan Prescribed Dose Per Fraction: 1.8 Gy
Plan Total Fractions Prescribed: 25
Plan Total Prescribed Dose: 45 Gy
Reference Point Dosage Given to Date: 9 Gy
Reference Point Session Dosage Given: 1.8 Gy
Session Number: 5

## 2023-02-24 MED ORDER — TIRZEPATIDE 7.5 MG/0.5ML ~~LOC~~ SOAJ: 7.5000 mg | SUBCUTANEOUS | 3 refills | Status: DC

## 2023-02-24 NOTE — Progress Notes (Signed)
Hello Keunna,  Your lab result is normal and/or stable.Some minor variations that are not significant are commonly marked abnormal, but do not represent any medical problem for you.  Best regards, Mechele Claude, M.D.

## 2023-02-25 ENCOUNTER — Other Ambulatory Visit: Payer: Self-pay

## 2023-02-25 ENCOUNTER — Ambulatory Visit
Admission: RE | Admit: 2023-02-25 | Discharge: 2023-02-25 | Disposition: A | Discharge status: Home / Self Care | Payer: Medicare Other | Source: Ambulatory Visit | Attending: Radiation Oncology | Admitting: Radiation Oncology

## 2023-02-25 DIAGNOSIS — Z510 Encounter for antineoplastic radiation therapy: Secondary | ICD-10-CM | POA: Diagnosis not present

## 2023-02-25 DIAGNOSIS — G894 Chronic pain syndrome: Secondary | ICD-10-CM | POA: Diagnosis not present

## 2023-02-25 DIAGNOSIS — E669 Obesity, unspecified: Secondary | ICD-10-CM | POA: Diagnosis not present

## 2023-02-25 DIAGNOSIS — M79671 Pain in right foot: Secondary | ICD-10-CM | POA: Diagnosis not present

## 2023-02-25 DIAGNOSIS — M79672 Pain in left foot: Secondary | ICD-10-CM | POA: Diagnosis not present

## 2023-02-25 DIAGNOSIS — C541 Malignant neoplasm of endometrium: Secondary | ICD-10-CM | POA: Diagnosis not present

## 2023-02-25 DIAGNOSIS — R768 Other specified abnormal immunological findings in serum: Secondary | ICD-10-CM | POA: Diagnosis not present

## 2023-02-25 DIAGNOSIS — Z6835 Body mass index (BMI) 35.0-35.9, adult: Secondary | ICD-10-CM | POA: Diagnosis not present

## 2023-02-25 DIAGNOSIS — M109 Gout, unspecified: Secondary | ICD-10-CM | POA: Diagnosis not present

## 2023-02-25 LAB — RAD ONC ARIA SESSION SUMMARY
Course Elapsed Days: 7
Plan Fractions Treated to Date: 6
Plan Prescribed Dose Per Fraction: 1.8 Gy
Plan Total Fractions Prescribed: 25
Plan Total Prescribed Dose: 45 Gy
Reference Point Dosage Given to Date: 10.8 Gy
Reference Point Session Dosage Given: 1.8 Gy
Session Number: 6

## 2023-02-25 NOTE — Progress Notes (Signed)
Care Coordination Note  02/25/2023 Name: Amber Stephenson MRN: [id number] DOB: 1946-04-27  Amber Stephenson is a 77 y.o. year old female who is a primary care patient of Stacks, Broadus John, MD and is actively engaged with the care management team. I reached out to [name] by phone today to assist with re-scheduling a follow up visit with the RN Case Manager  Follow up plan: Telephone appointment with care management team member scheduled for:03/09/23  [facility] Coordination Care Guide  Direct Dial: [phone]

## 2023-02-26 ENCOUNTER — Ambulatory Visit
Admission: RE | Admit: 2023-02-26 | Discharge: 2023-02-26 | Disposition: A | Discharge status: Home / Self Care | Payer: Medicare Other | Source: Ambulatory Visit | Attending: Radiation Oncology | Admitting: Radiation Oncology

## 2023-02-26 ENCOUNTER — Other Ambulatory Visit: Payer: Self-pay

## 2023-02-26 DIAGNOSIS — Z510 Encounter for antineoplastic radiation therapy: Secondary | ICD-10-CM | POA: Diagnosis not present

## 2023-02-26 DIAGNOSIS — C541 Malignant neoplasm of endometrium: Secondary | ICD-10-CM | POA: Diagnosis not present

## 2023-02-26 LAB — RAD ONC ARIA SESSION SUMMARY
Course Elapsed Days: 8
Plan Fractions Treated to Date: 7
Plan Prescribed Dose Per Fraction: 1.8 Gy
Plan Total Fractions Prescribed: 25
Plan Total Prescribed Dose: 45 Gy
Reference Point Dosage Given to Date: 12.6 Gy
Reference Point Session Dosage Given: 1.8 Gy
Session Number: 7

## 2023-02-27 ENCOUNTER — Other Ambulatory Visit: Payer: Self-pay

## 2023-02-27 ENCOUNTER — Ambulatory Visit
Admission: RE | Admit: 2023-02-27 | Discharge: 2023-02-27 | Disposition: A | Discharge status: Home / Self Care | Payer: Medicare Other | Source: Ambulatory Visit | Attending: Radiation Oncology | Admitting: Radiation Oncology

## 2023-02-27 DIAGNOSIS — S92512D Displaced fracture of proximal phalanx of left lesser toe(s), subsequent encounter for fracture with routine healing: Secondary | ICD-10-CM | POA: Diagnosis not present

## 2023-02-27 DIAGNOSIS — I110 Hypertensive heart disease with heart failure: Secondary | ICD-10-CM | POA: Diagnosis not present

## 2023-02-27 DIAGNOSIS — C541 Malignant neoplasm of endometrium: Secondary | ICD-10-CM | POA: Diagnosis not present

## 2023-02-27 DIAGNOSIS — J9611 Chronic respiratory failure with hypoxia: Secondary | ICD-10-CM | POA: Diagnosis not present

## 2023-02-27 DIAGNOSIS — I5033 Acute on chronic diastolic (congestive) heart failure: Secondary | ICD-10-CM | POA: Diagnosis not present

## 2023-02-27 DIAGNOSIS — Z510 Encounter for antineoplastic radiation therapy: Secondary | ICD-10-CM | POA: Diagnosis not present

## 2023-02-27 DIAGNOSIS — I4821 Permanent atrial fibrillation: Secondary | ICD-10-CM | POA: Diagnosis not present

## 2023-02-27 LAB — RAD ONC ARIA SESSION SUMMARY
Course Elapsed Days: 9
Plan Fractions Treated to Date: 8
Plan Prescribed Dose Per Fraction: 1.8 Gy
Plan Total Fractions Prescribed: 25
Plan Total Prescribed Dose: 45 Gy
Reference Point Dosage Given to Date: 14.4 Gy
Reference Point Session Dosage Given: 1.8 Gy
Session Number: 8

## 2023-03-02 ENCOUNTER — Ambulatory Visit: Payer: Medicare Other | Admitting: Radiation Oncology

## 2023-03-02 ENCOUNTER — Other Ambulatory Visit: Payer: Self-pay

## 2023-03-02 ENCOUNTER — Ambulatory Visit
Admission: RE | Admit: 2023-03-02 | Discharge: 2023-03-02 | Disposition: A | Discharge status: Home / Self Care | Payer: Medicare Other | Source: Ambulatory Visit | Attending: Radiation Oncology | Admitting: Radiation Oncology

## 2023-03-02 ENCOUNTER — Ambulatory Visit: Payer: Medicare Other | Admitting: Physical Medicine and Rehabilitation

## 2023-03-02 DIAGNOSIS — M79675 Pain in left toe(s): Secondary | ICD-10-CM | POA: Diagnosis not present

## 2023-03-02 DIAGNOSIS — C541 Malignant neoplasm of endometrium: Secondary | ICD-10-CM | POA: Diagnosis not present

## 2023-03-02 DIAGNOSIS — Z510 Encounter for antineoplastic radiation therapy: Secondary | ICD-10-CM | POA: Diagnosis not present

## 2023-03-02 LAB — RAD ONC ARIA SESSION SUMMARY
Course Elapsed Days: 12
Plan Fractions Treated to Date: 9
Plan Prescribed Dose Per Fraction: 1.8 Gy
Plan Total Fractions Prescribed: 25
Plan Total Prescribed Dose: 45 Gy
Reference Point Dosage Given to Date: 16.2 Gy
Reference Point Session Dosage Given: 1.8 Gy
Session Number: 9

## 2023-03-03 ENCOUNTER — Ambulatory Visit (INDEPENDENT_AMBULATORY_CARE_PROVIDER_SITE_OTHER): Payer: Medicare Other | Admitting: Physician Assistant

## 2023-03-03 ENCOUNTER — Other Ambulatory Visit: Payer: Self-pay

## 2023-03-03 ENCOUNTER — Ambulatory Visit
Admission: RE | Admit: 2023-03-03 | Discharge: 2023-03-03 | Disposition: A | Discharge status: Home / Self Care | Payer: Medicare Other | Source: Ambulatory Visit | Attending: Radiation Oncology | Admitting: Radiation Oncology

## 2023-03-03 ENCOUNTER — Telehealth: Payer: Self-pay | Admitting: Pulmonary Disease

## 2023-03-03 ENCOUNTER — Telehealth: Payer: Self-pay

## 2023-03-03 ENCOUNTER — Ambulatory Visit: Payer: Medicare Other

## 2023-03-03 ENCOUNTER — Encounter: Payer: Self-pay | Admitting: Physician Assistant

## 2023-03-03 VITALS — BP 114/62 | HR 78 | Resp 18 | Ht 62.0 in | Wt 196.0 lb

## 2023-03-03 DIAGNOSIS — Z510 Encounter for antineoplastic radiation therapy: Secondary | ICD-10-CM | POA: Diagnosis not present

## 2023-03-03 DIAGNOSIS — G4733 Obstructive sleep apnea (adult) (pediatric): Secondary | ICD-10-CM

## 2023-03-03 DIAGNOSIS — C541 Malignant neoplasm of endometrium: Secondary | ICD-10-CM | POA: Diagnosis not present

## 2023-03-03 DIAGNOSIS — G3184 Mild cognitive impairment, so stated: Secondary | ICD-10-CM | POA: Diagnosis not present

## 2023-03-03 LAB — RAD ONC ARIA SESSION SUMMARY
Course Elapsed Days: 13
Plan Fractions Treated to Date: 10
Plan Prescribed Dose Per Fraction: 1.8 Gy
Plan Total Fractions Prescribed: 25
Plan Total Prescribed Dose: 45 Gy
Reference Point Dosage Given to Date: 18 Gy
Reference Point Session Dosage Given: 1.8 Gy
Session Number: 10

## 2023-03-03 MED ORDER — DONEPEZIL HCL 5 MG PO TABS: 5.0000 mg | ORAL_TABLET | Freq: Every day | ORAL | 11 refills | Status: DC

## 2023-03-03 NOTE — Telephone Encounter (Signed)
Left vm that [employer] has been trying to contact patient to confirm pump supplies and ship order

## 2023-03-03 NOTE — Telephone Encounter (Signed)
Called patient with a lab appointment at 1330 before her radiation treatment tomorrow. Patient verbalized understanding.

## 2023-03-03 NOTE — Patient Instructions (Addendum)
  Follow up in 6 months April 2 at 11:30  Start Donepezil 5mg   daily. Side effects discussed   Continue to control mood as per PCP and Psychiatry, monitor polypharmacy Recommend close follow up with Pulmonary for increase sleepiness during the day

## 2023-03-03 NOTE — Progress Notes (Signed)
Assessment/Plan:   Mild cognitive impairment   Amber Stephenson is a very pleasant 77 y.o. RH female with a history of  CAD, CHF, Afib, Chronic respiratory  failure on O2, chronic pain, OSA, endometrial cancer on XRT,  presenting today in follow-up for evaluation of memory loss. Neuropsychological evaluation on 08/08/22 but given the extensive medical and psychiatric history along with polypharmacy, the test was unable to be conclusive for AD, thus, LP for AD diagnosis has been discussed for clarity of diagnosis, but adamantly declines. Discussed initiating donepezil 5 mg daily, she agrees to proceed.    Recommendations:   Follow up in  6 months. Recommend good control of cardiovascular risk factors Continue to control mood as per PCP and psychiatry Polypharmacy needs to be addressed as it certainly can affect her memory, including pain meds, muscle relaxers, mood meds among others. Recommend a clinical pharmacist to look into this list as soon as possible.  Follow up with Pulmonary for  OSA not on CPAP to start soon. Continue O2 at night   Start donepezil 5 mg daily     Subjective:   This patient is accompanied in the office by her husband  who supplements the history. Previous records as well as any outside records available were reviewed prior to todays visit.  Patient was last seen on  10/03/22.    Any changes in memory since last visit? About the same ""Worse, especially STM". Patient has some difficulty remembering recent conversations and people names. However, since using O2 at night sh may be more alert during the day.  repeats oneself?  Denies  Disoriented when walking into a room?  Patient denies   Leaving objects in unusual places?  Patient denies   Wandering behavior?   denies   Any personality changes since last visit? Mood is stable She is under psychiatric care.   Any worsening depression?: denies. Well controlled    Hallucinations or paranoia?  Endorsed.  She senses  people when coming out of her sleep.  Seizures?   denies    Any sleep changes? Sleeps well.  No much daytime sleepiness. She reports vivid dreams and remembers permitting, REM behavior or sleepwalking Sleep apnea? Endorsed, not on CPAP. At this time, awaiting the device.  Any hygiene concerns?   denies   Independent of bathing and dressing?  Endorsed  Does the patient needs help with medications? Husband is in charge   Who is in charge of the finances? Husband is in charge     Any changes in appetite?  denies     Patient have trouble swallowing? denies   Does the patient cook?  Any kitchen accidents such as leaving the stove on?   denies   Any headaches?    Denies    Vision changes? Denies  Chronic pain? She has chronic back pain, on multiple meds. Better managed than prior. Ambulates with difficulty?   denies   Recent falls or head injuries?  In June 2024 she fell while going tot the bathroom, a mechanical fall, and sustained blunt trauma to the head with negative CT head.  No LOC   Unilateral weakness, numbness or tingling?   Denies.   Any tremors?  Denies  Any anosmia?    Denies.   Any incontinence of urine?  denies   Any bowel dysfunction?  denies      Patient lives with husband    Does the patient drive? No      Neuropsychological evaluation 08/08/22  Scores across stand-alone and embedded performance validity measures were variable. As such, while there remains the potential that current scores reflect true impairment and progressive decline (see below), these also may reflect Amber Stephenson occasionally zoning out while attempting cognitive tasks, coupled with significant psychiatric distress, acute on chronic pain, and reported sleep dysfunction. Briefly, results suggested diffuse impairment impacting nearly all assessed cognitive domains. Performances were appropriate across confrontation naming and visuospatial abilities, while some variability was seen across recognition/consolidation  aspects of memory.       History on Initial Assessment 07/17/2017: This is a 77 year old right-handed woman with a history of hypertension, hyperlipidemia, diabetes, atrial fibrillation, chronic pain, bipolar disorder, presenting for evaluation of memory changes. She feels her memory has gotten really bad over the past 2 months. She cannot remember words/word-finding difficulties. She "does not think it's Alzheimer's" but feels like she is not paying attention. Family reminds her that they had told her something already previously. Her husband has to remind her of her medications. They put her pills together in a pillbox. Her husband is in charge of finances. She stopped driving 4 years ago, she denied getting lost when she used to drive. She started noticing memory changes after she was started on a medication after she had her colonoscopy. She has also been dealing with a lot of pain, she states her Percocet dose was cut in half a month ago and this is causing her a lot of issues. She has poor sleep, 3 hours at the most. She was recently started on Trazodone by her psychiatrist, which may be helping. Her father had Alzheimer's disease. She denies any history of significant head injuries, no alcohol use.   She has frequent headaches, around 20 headache days a month with stabbing pain in the frontal region that can last up to 3 days, with associated nausea/vomiting. If she takes medication, pain lasts up to 2 hours. She has dizziness at least 1-2 times a day where it feels like the room is spinning. She has had falls for "no clear reason," last fall was 3 months ago. She has chronic neck and back pain, no focal numbness/tingling/weakness. She has tremors in both hands and has a diagnosis of essential tremor, it is "very difficult to do things," it has affected her handwriting, computer use. No family history of tremors. No bowel/bladder dysfunction or anosmia.      I personally reviewed MRI brain without  contrast done 05/13/17 which did not show any acute changes. There was mild diffuse atrophy and mild to moderate chronic microvascular disease.   Neuropsychological testing in 04/2019 showed primary areas of impairment with processing speed and attention/concentration. 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 She also scored in the mild range of anxiety and depression, and it was felt that the combination of these with daily headaches, dizziness, sleep apnea, and chronic pain, negatively influence day to day cognitive function.    MRI brain 04/28/21  1. No acute intracranial process. 2. Mildly advanced atrophy for age, with a suggestion of slightly greater atrophy in the right temporal lobe.    Significant studies: 4/1 CT head: No acute intracranial abnormality    4/1CT chest: No acute findings-no PNA 4/ EEG: No seizures 4/2 echo: EF 65-70%.     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   Benign paroxysmal positional vertigo due to bilateral vestibular disorder 05/20/2019   Bipolar 1 disorder (HCC) 01/23/2015   with GAD, benzo dependence   BPPV (benign paroxysmal positional vertigo) 05/20/2019   CAD (coronary artery disease)    Nonobstructive; Managed by Dr. Purvis Sheffield   Cardiomegaly 01/12/2018   CHF (congestive heart failure) 06/08/2022   Chronic back pain 01/04/2015   Chronic constipation 04/25/2020   Chronic diastolic heart failure (HCC) 05/30/2015   Chronic pain syndrome 08/22/2019   back pain, sacroiliitis   Chronic pain syndrome  08/22/2019   Chronic post-traumatic stress disorder (PTSD) 12/06/2020   Diabetic neuropathy (HCC) 02/06/2016   Dyslipidemia 09/24/2020   Dyspnea on exertion    Essential hypertension    Family history of coronary arteriosclerosis 05/30/2015   Functional diarrhea 10/26/2020   Head trauma 09/17/2020   Hemorrhoids 03/11/2022   Herpes genitalis in women 07/16/2015   History of adenomatous polyp of colon 05/21/2019   Overview:   03/31/17: Colonoscopy: nonadvanced adenoma, microscopic colitis, f/u 5 yrs, [facility]/GAP   Insomnia 01/23/2015   Lipoma 02/08/2015   Migraine headache with aura 02/12/2016   Myofascial pain dysfunction syndrome 08/22/2019   Myofascial pain dysfunction syndrome 08/22/2019   Non-alcoholic fatty liver disease 01/12/2018   Opioid dependence (HCC) 12/06/2020   OSA (obstructive sleep apnea) 02/24/2019   10/09/2018 - HST  - AHI 40.6    Osteopenia 12/06/2020   Rx alendronate 35 mg.   Pinguecula 12/06/2020   Postcoital bleeding 12/06/2020   Presbyopia 12/06/2020   Pulmonary hypertension    RLS (restless legs syndrome) 04/27/2015   Superficial fungus infection of skin 09/03/2021   Tremor, essential 12/11/2021   Type II diabetes mellitus (HCC) 09/24/2020     Past Surgical History:  Procedure Laterality Date   BREAST REDUCTION SURGERY     EYE SURGERY Right    cateracts   HAMMER TOE SURGERY     LEFT HEART CATH AND CORONARY ANGIOGRAPHY N/A 02/03/2018   Procedure: LEFT HEART CATH AND CORONARY ANGIOGRAPHY;  Surgeon: [name], Peter M, MD;  Location: [facility] INVASIVE CV LAB;  Service: Cardiovascular;  Laterality: N/A;   REVERSE SHOULDER ARTHROPLASTY Right 08/19/2019   Procedure: REVERSE SHOULDER ARTHROPLASTY;  Surgeon: Beverely Low, MD;  Location: [facility] ORS;  Service: Orthopedics;  Laterality: Right;  interscalene block   SHOULDER SURGERY Right    "I BROKE MY SHOUDLER   THIGH SURGERY     "TO REMOVE A TUMOR "     PREVIOUS MEDICATIONS:   CURRENT MEDICATIONS:  Outpatient  Encounter Medications as of 03/03/2023  Medication Sig   albuterol (VENTOLIN HFA) 108 (90 Base) MCG/ACT inhaler Inhale 2 puffs into the lungs every 6 (six) hours as needed for wheezing or shortness of breath.   allopurinol (ZYLOPRIM) 100 MG tablet Take 100 mg by mouth daily.   apixaban (ELIQUIS) 5 MG TABS tablet Take 1 tablet (5 mg total) by mouth 2 (two) times daily.   ARIPiprazole (ABILIFY) 2 MG tablet Take 2 mg by mouth at bedtime.   atorvastatin (LIPITOR) 80 MG tablet Take 1 tablet (80 mg total) by mouth daily.   BELSOMRA 20 MG TABS Take 20 mg by mouth at bedtime as needed (for sleep).   Continuous Blood Gluc Sensor (DEXCOM G7 SENSOR) MISC Change sensor every 10 days (  Patient taking differently: Inject 1 Device into the skin See admin instructions. Place 1 new sensor into the skin every 10 days)   cyanocobalamin (VITAMIN B12) 1000 MCG tablet Take 1 tablet (1,000 mcg total) by mouth daily.   denosumab (PROLIA) 60 MG/ML SOSY injection Inject 60 mg into the skin every 6 (six) months.   donepezil (ARICEPT) 5 MG tablet Take 1 tablet (5 mg total) by mouth daily.   doxepin (SINEQUAN) 10 MG capsule Take 10 mg by mouth at bedtime.   escitalopram (LEXAPRO) 10 MG tablet Take 10 mg by mouth daily.   fluticasone-salmeterol (ADVAIR DISKUS) 100-50 MCG/ACT AEPB Inhale 1 puff into the lungs 2 (two) times daily. (Patient taking differently: Inhale 1 puff into the lungs 2 (two) times daily as needed (for flares).)   furosemide (LASIX) 20 MG tablet Take 20 mg by mouth See admin instructions. Take 20 mg by mouth at 8 AM and 6 PM   Galcanezumab-gnlm (EMGALITY) 120 MG/ML SOAJ INJECT CONTENTS ON 1 PEN(120MG ) INTO THE SKIN ONCE MONTHLY (Patient taking differently: Inject 120 mg into the skin every 30 (thirty) days.)   hydrochlorothiazide (MICROZIDE) 12.5 MG capsule Take 12.5 mg by mouth daily.   Insulin Disposable Pump (OMNIPOD 5 G7 INTRO, GEN 5,) KIT 1 Device by Does not apply route every other day.   Insulin  Disposable Pump (OMNIPOD 5 G7 PODS, GEN 5,) MISC 1 Device by Does not apply route every other day.   insulin glargine (LANTUS SOLOSTAR) 100 UNIT/ML Solostar Pen Inject 35 Units into the skin 2 (two) times daily.   insulin lispro (HUMALOG KWIKPEN) 100 UNIT/ML KwikPen Max daily 50 units (Patient taking differently: Inject 4 Units into the skin See admin instructions. Inject 4 units into the skin 2 times a day with meals, per sliding scale. Add 1 additional unit for every 30 points above a BGL reading of 160.)   Insulin Pen Needle 29G X MISC 1 Device by Does not apply route daily in the afternoon.   LORazepam (ATIVAN) 0.5 MG tablet Take 0.5-1 mg by mouth See admin instructions. Take 0.5 mg by mouth in the morning and 1 mg at bedtime   metFORMIN (GLUCOPHAGE-XR) 500 MG 24 hr tablet Take 2 tablets (1,000 mg total) by mouth daily with breakfast.   methocarbamol 1000 MG TABS Take 1,000 mg by mouth every 6 (six) hours as needed for muscle spasms.   metoprolol tartrate (LOPRESSOR) 50 MG tablet Take 1 tablet (50 mg total) by mouth 2 (two) times daily.   morphine (MS CONTIN) 30 MG 12 hr tablet Take 1 tablet (30 mg total) by mouth every 12 (twelve) hours. For chronic pain-increased dose for pt-   naloxone (NARCAN) nasal spray 4 mg/0.1 mL Place 1 spray into the nose once as needed (AS DIRECTED).   nystatin (MYCOSTATIN/NYSTOP) powder Apply 1 Application topically as needed (rash).   nystatin ointment (MYCOSTATIN) Apply 1 Application topically 2 (two) times daily. (Patient taking differently: Apply 1 Application topically 2 (two) times daily as needed (for rashes).)   ondansetron (ZOFRAN) 4 MG tablet Take 1 tablet (4 mg total) by mouth every 8 (eight) hours as needed for nausea or vomiting. For cancer related nausea   ondansetron (ZOFRAN-ODT) 8 MG disintegrating tablet Take 1 tablet (8 mg total) by mouth every 6 (six) hours as needed for nausea or vomiting.   OXcarbazepine (TRILEPTAL) 150 MG tablet Take 1 tablet  (150 mg total) by mouth 2 (two) times daily.   pantoprazole (PROTONIX) 40 MG  tablet Take 40 mg by mouth daily as needed (for reflux).   potassium chloride (KLOR-CON) 10 MEQ tablet Take 1 tablet (10 mEq total) by mouth daily.   pregabalin (LYRICA) 200 MG capsule Take 200 mg by mouth 2 (two) times daily.   promethazine (PHENERGAN) 25 MG tablet Take 0.5-1 tablets (12.5-25 mg total) by mouth every 6 (six) hours as needed for refractory nausea / vomiting (cancer related nausea/vomiting).   senna-docusate (SENOKOT-S) 8.6-50 MG tablet Take 1 tablet by mouth at bedtime. (Patient taking differently: Take 1 tablet by mouth at bedtime as needed (for constipation).)   spironolactone (ALDACTONE) 25 MG tablet Take 0.5 tablets (12.5 mg total) by mouth daily.   tirzepatide (MOUNJARO) 7.5 MG/0.5ML Pen Inject 7.5 mg into the skin once a week.   TYLENOL 500 MG tablet Take 500-1,000 mg by mouth every 6 (six) hours as needed for mild pain or headache.   Vitamin D, Cholecalciferol, 25 MCG (1000 UT) TABS Take 1,000 Units by mouth daily in the afternoon.   norethindrone (AYGESTIN) 5 MG tablet Take 2 tablets (10 mg total) by mouth daily.   Facility-Administered Encounter Medications as of 03/03/2023  Medication   bupivacaine-epinephrine (MARCAINE W/ EPI) 0.5% -1:200000 injection     Objective:     PHYSICAL EXAMINATION:    VITALS:   Vitals:   03/03/23 1124  BP: 114/62  Pulse: 78  Resp: 18  SpO2: 98%  Weight: 196 lb (88.9 kg)  Height: 5\' 2"  (1.575 m)    GEN:  The patient appears stated age and is in NAD. HEENT:  Normocephalic, atraumatic.   Neurological examination:  General: NAD, well-groomed, appears stated age. Orientation: The patient is alert. Oriented to person, place and not to date Cranial nerves: There is good facial symmetry.The speech is fluent and clear. No aphasia or dysarthria. Fund of knowledge is appropriate. Recent memory impaired and remote memory is normal.  Attention and  concentration are normal.  Able to name objects and repeat phrases.  Hearing is intact to conversational tone.    Sensation: Sensation is intact to light touch throughout Motor: Strength is at least antigravity x4. DTR's 2/4 in UE/LE      01/26/2019   11:00 AM 08/06/2018   11:00 AM  Montreal Cognitive Assessment   Visuospatial/ Executive (0/5) 2 4  Naming (0/3) 3 3  Attention: Read list of digits (0/2) 1 2  Attention: Read list of letters (0/1) 0 1  Attention: Serial 7 subtraction starting at 100 (0/3) 0 1  Language: Repeat phrase (0/2) 1 1  Language : Fluency (0/1) 0 0  Abstraction (0/2) 1 1  Delayed Recall (0/5) 0 4  Orientation (0/6) 3 6  Total 11 23       11/08/2021    7:00 AM 08/28/2020   11:00 AM 01/18/2018    4:00 PM  MMSE - Mini Mental State Exam  Orientation to time 5 4 5   Orientation to Place 5 5 5   Registration 3 3 3   Attention/ Calculation 3 1 3   Recall 3 3 3   Language- name 2 objects 2 2 2   Language- repeat 1 1 1   Language- follow 3 step command 3 3 3   Language- read & follow direction 1 1 1   Write a sentence 1 1 1   Copy design 1 1 1   Total score 28 25 28        Movement examination: Tone: There is normal tone in the UE/LE Abnormal movements:  no tremor.  No myoclonus.  No asterixis.   Coordination:  There is no decremation with RAM's. Normal finger to nose  Gait and Station: The patient has no difficulty arising out of a deep-seated chair without the use of the hands. The patient's stride length is good.  Gait is cautious and narrow.   Thank you for allowing [place] the opportunity to participate in the care of this nice patient. Please do not hesitate to contact us for any questions or concerns.   Total time spent on today's visit was 27 minutes dedicated to this patient today, preparing to see patient, examining the patient, ordering tests and/or medications and counseling the patient, documenting clinical information in the EHR or other health record,  independently interpreting results and communicating results to the patient/family, discussing treatment and goals, answering patient's questions and coordinating care.  Cc:  Mechele Claude, MD  Marlowe Kays 03/03/2023 4:41 PM

## 2023-03-03 NOTE — Telephone Encounter (Signed)
[employer] needs a new prescription order for patient's CPAP machine.

## 2023-03-04 ENCOUNTER — Ambulatory Visit
Admission: RE | Admit: 2023-03-04 | Discharge: 2023-03-04 | Discharge status: Home / Self Care | Payer: Medicare Other | Source: Ambulatory Visit | Attending: Radiation Oncology | Admitting: Radiation Oncology

## 2023-03-04 ENCOUNTER — Telehealth: Payer: Self-pay | Admitting: Family Medicine

## 2023-03-04 ENCOUNTER — Ambulatory Visit
Admission: RE | Admit: 2023-03-04 | Discharge: 2023-03-04 | Disposition: A | Discharge status: Home / Self Care | Payer: Medicare Other | Source: Ambulatory Visit | Attending: Radiation Oncology | Admitting: Radiation Oncology

## 2023-03-04 ENCOUNTER — Ambulatory Visit: Payer: Medicare Other

## 2023-03-04 ENCOUNTER — Other Ambulatory Visit: Payer: Self-pay

## 2023-03-04 DIAGNOSIS — C541 Malignant neoplasm of endometrium: Secondary | ICD-10-CM

## 2023-03-04 DIAGNOSIS — S92512D Displaced fracture of proximal phalanx of left lesser toe(s), subsequent encounter for fracture with routine healing: Secondary | ICD-10-CM | POA: Diagnosis not present

## 2023-03-04 DIAGNOSIS — Z510 Encounter for antineoplastic radiation therapy: Secondary | ICD-10-CM | POA: Diagnosis not present

## 2023-03-04 DIAGNOSIS — J9611 Chronic respiratory failure with hypoxia: Secondary | ICD-10-CM | POA: Diagnosis not present

## 2023-03-04 DIAGNOSIS — I4821 Permanent atrial fibrillation: Secondary | ICD-10-CM | POA: Diagnosis not present

## 2023-03-04 DIAGNOSIS — I5033 Acute on chronic diastolic (congestive) heart failure: Secondary | ICD-10-CM | POA: Diagnosis not present

## 2023-03-04 DIAGNOSIS — I110 Hypertensive heart disease with heart failure: Secondary | ICD-10-CM | POA: Diagnosis not present

## 2023-03-04 LAB — CMP (CANCER CENTER ONLY)
ALT: 11 U/L (ref 0–44)
AST: 26 U/L (ref 15–41)
Albumin: 3.8 g/dL (ref 3.5–5.0)
Alkaline Phosphatase: 70 U/L (ref 38–126)
Anion gap: 8 (ref 5–15)
BUN: 43 mg/dL — ABNORMAL HIGH (ref 8–23)
CO2: 31 mmol/L (ref 22–32)
Calcium: 9 mg/dL (ref 8.9–10.3)
Chloride: 96 mmol/L — ABNORMAL LOW (ref 98–111)
Creatinine: 1.2 mg/dL — ABNORMAL HIGH (ref 0.44–1.00)
GFR, Estimated: 47 mL/min — ABNORMAL LOW (ref >60)
Glucose, Bld: 204 mg/dL — ABNORMAL HIGH (ref 70–99)
Potassium: 3.9 mmol/L (ref 3.5–5.1)
Sodium: 135 mmol/L (ref 135–145)
Total Bilirubin: 0.5 mg/dL (ref 0.3–1.2)
Total Protein: 6.7 g/dL (ref 6.5–8.1)

## 2023-03-04 LAB — RAD ONC ARIA SESSION SUMMARY
Course Elapsed Days: 14
Plan Fractions Treated to Date: 11
Plan Prescribed Dose Per Fraction: 1.8 Gy
Plan Total Fractions Prescribed: 25
Plan Total Prescribed Dose: 45 Gy
Reference Point Dosage Given to Date: 19.8 Gy
Reference Point Session Dosage Given: 1.8 Gy
Session Number: 11

## 2023-03-04 LAB — CBC WITH DIFFERENTIAL (CANCER CENTER ONLY)
Abs Immature Granulocytes: 0.02 10*3/uL (ref 0.00–0.07)
Basophils Absolute: 0 10*3/uL (ref 0.0–0.1)
Basophils Relative: 0 %
Eosinophils Absolute: 0.2 10*3/uL (ref 0.0–0.5)
Eosinophils Relative: 3 %
HCT: 33.9 % — ABNORMAL LOW (ref 36.0–46.0)
Hemoglobin: 10.8 g/dL — ABNORMAL LOW (ref 12.0–15.0)
Immature Granulocytes: 0 %
Lymphocytes Relative: 13 %
Lymphs Abs: 0.7 10*3/uL (ref 0.7–4.0)
MCH: 28.6 pg (ref 26.0–34.0)
MCHC: 31.9 g/dL (ref 30.0–36.0)
MCV: 89.7 fL (ref 80.0–100.0)
Monocytes Absolute: 0.4 10*3/uL (ref 0.1–1.0)
Monocytes Relative: 8 %
Neutro Abs: 4.1 10*3/uL (ref 1.7–7.7)
Neutrophils Relative %: 76 %
Platelet Count: 111 10*3/uL — ABNORMAL LOW (ref 150–400)
RBC: 3.78 MIL/uL — ABNORMAL LOW (ref 3.87–5.11)
RDW: 16 % — ABNORMAL HIGH (ref 11.5–15.5)
WBC Count: 5.4 10*3/uL (ref 4.0–10.5)
nRBC: 0 % (ref 0.0–0.2)

## 2023-03-05 ENCOUNTER — Other Ambulatory Visit: Payer: Self-pay

## 2023-03-05 ENCOUNTER — Ambulatory Visit
Admission: RE | Admit: 2023-03-05 | Discharge: 2023-03-05 | Disposition: A | Discharge status: Home / Self Care | Payer: Medicare Other | Source: Ambulatory Visit | Attending: Radiation Oncology | Admitting: Radiation Oncology

## 2023-03-05 ENCOUNTER — Ambulatory Visit: Payer: Medicare Other

## 2023-03-05 DIAGNOSIS — Z510 Encounter for antineoplastic radiation therapy: Secondary | ICD-10-CM | POA: Diagnosis not present

## 2023-03-05 DIAGNOSIS — C541 Malignant neoplasm of endometrium: Secondary | ICD-10-CM | POA: Diagnosis not present

## 2023-03-05 LAB — RAD ONC ARIA SESSION SUMMARY
Course Elapsed Days: 15
Plan Fractions Treated to Date: 12
Plan Prescribed Dose Per Fraction: 1.8 Gy
Plan Total Fractions Prescribed: 25
Plan Total Prescribed Dose: 45 Gy
Reference Point Dosage Given to Date: 21.6 Gy
Reference Point Session Dosage Given: 1.8 Gy
Session Number: 12

## 2023-03-05 NOTE — Telephone Encounter (Signed)
FYI

## 2023-03-06 ENCOUNTER — Other Ambulatory Visit: Payer: Self-pay

## 2023-03-06 ENCOUNTER — Ambulatory Visit
Admission: RE | Admit: 2023-03-06 | Discharge: 2023-03-06 | Disposition: A | Discharge status: Home / Self Care | Payer: Medicare Other | Source: Ambulatory Visit | Attending: Radiation Oncology | Admitting: Radiation Oncology

## 2023-03-06 ENCOUNTER — Telehealth: Payer: Self-pay | Admitting: Cardiology

## 2023-03-06 ENCOUNTER — Ambulatory Visit: Payer: Medicare Other

## 2023-03-06 DIAGNOSIS — C541 Malignant neoplasm of endometrium: Secondary | ICD-10-CM | POA: Diagnosis not present

## 2023-03-06 DIAGNOSIS — Z510 Encounter for antineoplastic radiation therapy: Secondary | ICD-10-CM | POA: Diagnosis not present

## 2023-03-06 LAB — RAD ONC ARIA SESSION SUMMARY
Course Elapsed Days: 16
Plan Fractions Treated to Date: 13
Plan Prescribed Dose Per Fraction: 1.8 Gy
Plan Total Fractions Prescribed: 25
Plan Total Prescribed Dose: 45 Gy
Reference Point Dosage Given to Date: 23.4 Gy
Reference Point Session Dosage Given: 1.8 Gy
Session Number: 13

## 2023-03-06 NOTE — Telephone Encounter (Signed)
Patient is returning call to discuss lab results. 

## 2023-03-06 NOTE — Telephone Encounter (Signed)
"  [name], Your kidney function is normal.  Your sodium is a little low but it has been low before.  Potassium is normal.  No medication changes at the moment.   I will send a copy to your PCP.  Thanks   Sharlene Dory, PA-C   Hello Amber Stephenson,    Your lab result is normal and/or stable.Some minor variations that are not significant are commonly marked abnormal, but do not represent any medical problem for you.   Best regards, Mechele Claude, M.D."   Called patient back with messages above. Patient verbalized understanding.

## 2023-03-09 ENCOUNTER — Ambulatory Visit: Payer: Self-pay | Admitting: *Deleted

## 2023-03-09 ENCOUNTER — Other Ambulatory Visit: Payer: Self-pay

## 2023-03-09 ENCOUNTER — Ambulatory Visit: Payer: Medicare Other

## 2023-03-09 ENCOUNTER — Ambulatory Visit
Admission: RE | Admit: 2023-03-09 | Discharge: 2023-03-09 | Disposition: A | Discharge status: Home / Self Care | Payer: Medicare Other | Source: Ambulatory Visit | Attending: Radiation Oncology | Admitting: Radiation Oncology

## 2023-03-09 DIAGNOSIS — J22 Unspecified acute lower respiratory infection: Secondary | ICD-10-CM | POA: Diagnosis not present

## 2023-03-09 DIAGNOSIS — C541 Malignant neoplasm of endometrium: Secondary | ICD-10-CM | POA: Diagnosis not present

## 2023-03-09 DIAGNOSIS — N390 Urinary tract infection, site not specified: Secondary | ICD-10-CM | POA: Diagnosis not present

## 2023-03-09 DIAGNOSIS — Z510 Encounter for antineoplastic radiation therapy: Secondary | ICD-10-CM | POA: Diagnosis not present

## 2023-03-09 LAB — RAD ONC ARIA SESSION SUMMARY
Course Elapsed Days: 19
Plan Fractions Treated to Date: 14
Plan Prescribed Dose Per Fraction: 1.8 Gy
Plan Total Fractions Prescribed: 25
Plan Total Prescribed Dose: 45 Gy
Reference Point Dosage Given to Date: 25.2 Gy
Reference Point Session Dosage Given: 1.8 Gy
Session Number: 14

## 2023-03-09 NOTE — Patient Outreach (Signed)
Care Coordination   Follow Up Visit Note   03/09/2023 Name: Amber Stephenson MRN: [id number] DOB: Oct 19, 1945  Amber Stephenson is a 77 y.o. year old female who sees Stacks, Broadus John, MD for primary care. I spoke with  [name] by phone today.  What matters to the patients health and wellness today?  Shortness of breath, Radiation, CPAP, pcp appointment, inquiry about day oxygen   Throughout this call, she is noted with heavy breathing and stating frequently "I can't remember"  Radiation sessions are leaving her fatigue with poor appetite and diarrhea She reports her diarrhea is manageable and she has not had to use of as needed medicines She does have nutritional supplements and will use them when not able to eat She reports moving around slower today with oxygen saturations of 80-88%. Last seen by pcp on 02/19/23 and scheduled for more radiation today at 2:15 pm   Pcp appointment  She initially reported forgetting to make a pcp appointment for vaccines like flu, tetanus, etc but has not reported her shortness of breath with low saturations. She agrees to allow RN CM to outreach to her pcp with her today She was able to agree with Jan, Triage nurse to visit the office on 03/11/23 at 10/25 or earlier prior to her 2:15 pm radiation   CPAP- she confirms she has not heard the status of her CPAP.     Goals Addressed             This Visit's Progress    Care Coordination for Plan of care for Management of Shortness og breath, oncology services Diabetes       Interventions Today    Flowsheet Row Most Recent Value  Chronic Disease   Chronic disease during today's visit Other  [cognitive concerns, respiratory symptoms/shortness of breath   Low oxygen saturation of 80-88% requesting home oxygen for the day, Diarrhea after radiation]  General Interventions   General Interventions Discussed/Reviewed General Interventions Reviewed, Durable Medical Equipment (DME), Doctor Visits,  Communication with  Doctor Visits Discussed/Reviewed Doctor Visits Reviewed, PCP, Specialist  Durable Medical Equipment (DME) Oxygen, Other  [pulse ox]  PCP/Specialist Visits Compliance with follow-up visit  Communication with RN, PCP/Specialists  [outreach to pcp office with patient to schedule 03/11/23 office visit. Spoke with Triage nurse. Unable to get patent to go to office on today or tomorrow. Patient also wants to get further vaccines - Tetanus+]  Exercise Interventions   Exercise Discussed/Reviewed Exercise Discussed, Physical Activity  Education Interventions   Education Provided Provided Education, Provided Web-based Education  Provided Verbal Education On Medication, Community Resources  Mental Health Interventions   Mental Health Discussed/Reviewed Mental Health Discussed, Coping Strategies  Nutrition Interventions   Nutrition Discussed/Reviewed Nutrition Discussed, Fluid intake, Supplemental nutrition  Pharmacy Interventions   Pharmacy Dicussed/Reviewed Pharmacy Topics Discussed, Medications and their functions, Affording Medications  Safety Interventions   Safety Discussed/Reviewed Safety Reviewed, Home Safety  Home Safety Assistive Devices              SDOH assessments and interventions completed:  No     Care Coordination Interventions:  Yes, provided   Follow up plan: Follow up call scheduled for 03/10/23    Encounter Outcome:  Patient Visit Completed    [name], RN, BSN, [facility]  VBCI Care Management Coordinator  [phone]  Fax: [phone]

## 2023-03-09 NOTE — Telephone Encounter (Signed)
C-peptide and glucose has been faxed to [facility] to go with the pump order.

## 2023-03-09 NOTE — Patient Instructions (Addendum)
Visit Information  Thank you for taking time to visit with me today. Please don't hesitate to contact me if I can be of assistance to you.   Following are the goals we discussed today:   Goals Addressed             This Visit's Progress    Care Coordination for Plan of care for Management of Shortness og breath, oncology services Diabetes       Interventions Today    Flowsheet Row Most Recent Value  Chronic Disease   Chronic disease during today's visit Other  [cognitive concerns, respiratory symptoms/shortness of breath   Low oxygen saturation of 80-88% requesting home oxygen for the day, Diarrhea after radiation]  General Interventions   General Interventions Discussed/Reviewed General Interventions Reviewed, Durable Medical Equipment (DME), Doctor Visits, Communication with  Doctor Visits Discussed/Reviewed Doctor Visits Reviewed, PCP, Specialist  Durable Medical Equipment (DME) Oxygen, Other  [pulse ox]  PCP/Specialist Visits Compliance with follow-up visit  Communication with RN, PCP/Specialists  [outreach to pcp office with patient to schedule 03/11/23 office visit. Spoke with Triage nurse. Unable to get patent to go to office on today or tomorrow. Patient also wants to get further vaccines - Tetanus+]  Exercise Interventions   Exercise Discussed/Reviewed Exercise Discussed, Physical Activity  Education Interventions   Education Provided Provided Education, Provided Web-based Education  Provided Verbal Education On Medication, Community Resources  Mental Health Interventions   Mental Health Discussed/Reviewed Mental Health Discussed, Coping Strategies  Nutrition Interventions   Nutrition Discussed/Reviewed Nutrition Discussed, Fluid intake, Supplemental nutrition  Pharmacy Interventions   Pharmacy Dicussed/Reviewed Pharmacy Topics Discussed, Medications and their functions, Affording Medications  Safety Interventions   Safety Discussed/Reviewed Safety Reviewed, Home Safety   Home Safety Assistive Devices              Our next appointment is by telephone on 03/10/23 at 2:15 pm Please call the care guide team at [phone] if you need to cancel or reschedule your appointment.   If you are experiencing a Mental Health or Behavioral Health Crisis or need someone to talk to, please call the Suicide and Crisis Lifeline: 988 call the [place] National Suicide Prevention Lifeline: 254-329-0932 or TTY: 5671533830 TTY 580-511-3867) to talk to a trained counselor call 1-800-273-TALK (toll free, 24 hour hotline) call the [facility]: [phone] call 911   Patient verbalizes understanding of instructions and care plan provided today and agrees to view in MyChart. Active MyChart status and patient understanding of how to access instructions and care plan via MyChart confirmed with patient.     The patient has been provided with contact information for the care management team and has been advised to call with any health related questions or concerns.   Ellie Bryand L. Noelle Penner, RN, BSN, [facility]  VBCI Care Management Coordinator  [phone]  Fax: [phone]

## 2023-03-09 NOTE — Telephone Encounter (Signed)
Updated order placed for [employer].

## 2023-03-10 ENCOUNTER — Ambulatory Visit
Admission: RE | Admit: 2023-03-10 | Discharge: 2023-03-10 | Disposition: A | Discharge status: Home / Self Care | Payer: Medicare Other | Source: Ambulatory Visit | Attending: Radiation Oncology | Admitting: Radiation Oncology

## 2023-03-10 ENCOUNTER — Ambulatory Visit: Payer: Self-pay | Admitting: *Deleted

## 2023-03-10 ENCOUNTER — Ambulatory Visit: Payer: Medicare Other

## 2023-03-10 ENCOUNTER — Other Ambulatory Visit: Payer: Self-pay

## 2023-03-10 DIAGNOSIS — C541 Malignant neoplasm of endometrium: Secondary | ICD-10-CM | POA: Diagnosis not present

## 2023-03-10 DIAGNOSIS — Z510 Encounter for antineoplastic radiation therapy: Secondary | ICD-10-CM | POA: Diagnosis not present

## 2023-03-10 LAB — RAD ONC ARIA SESSION SUMMARY
Course Elapsed Days: 20
Plan Fractions Treated to Date: 15
Plan Prescribed Dose Per Fraction: 1.8 Gy
Plan Total Fractions Prescribed: 25
Plan Total Prescribed Dose: 45 Gy
Reference Point Dosage Given to Date: 27 Gy
Reference Point Session Dosage Given: 1.8 Gy
Session Number: 15

## 2023-03-10 NOTE — Patient Instructions (Signed)
Visit Information  Thank you for taking time to visit with me today. Please don't hesitate to contact me if I can be of assistance to you.   Following are the goals we discussed today:   Goals Addressed             This Visit's Progress    Care Coordination for Plan of care for Management of Shortness og breath, oncology services Diabetes   On track    Interventions Today    Flowsheet Row Most Recent Value  Chronic Disease   Chronic disease during today's visit Other  [cognitive concerns, respiratory symptoms/shortness of breath resolved, CPAP & home oxygen for the day-[employer] [place]]  General Interventions   General Interventions Discussed/Reviewed General Interventions Reviewed, Durable Medical Equipment (DME), Community Resources, Doctor Visits, Communication with  Doctor Visits Discussed/Reviewed Doctor Visits Reviewed, PCP, Specialist  Durable Medical Equipment (DME) Oxygen  PCP/Specialist Visits --  [sent clinical pool note to pcp staff for vaccines, day oxygen/walk test, CPAP]  Communication with --  [spoke with Misty Stanley at [place] who confirms per [employer] notes, she spoke with spouse, Karren Burly & the CPAP & oxygen were reported as not needed in september 2024 (02/11/23)[employer] will need newCPAP orders+ walk test if portable oxygen needed]              Our next appointment is by telephone on 03/19/23 at 1045  Please call the care guide team at [phone] if you need to cancel or reschedule your appointment.   If you are experiencing a Mental Health or Behavioral Health Crisis or need someone to talk to, please call the Suicide and Crisis Lifeline: 988 call the [place] National Suicide Prevention Lifeline: (508)539-7009 or TTY: (337)759-0611 TTY 972-005-7027) to talk to a trained counselor call 1-800-273-TALK (toll free, 24 hour hotline) call the [facility]: [phone] call 911   Patient verbalizes understanding of instructions and care plan provided  today and agrees to view in MyChart. Active MyChart status and patient understanding of how to access instructions and care plan via MyChart confirmed with patient.     The patient has been provided with contact information for the care management team and has been advised to call with any health related questions or concerns.   Cora Brierley L. Noelle Penner, RN, BSN, [facility]  VBCI Care Management Coordinator  [phone]  Fax: [phone]

## 2023-03-10 NOTE — Patient Outreach (Signed)
Care Coordination   Follow Up Visit Note   03/10/2023 Name: Amber Stephenson MRN: [id number] DOB: 05-18-1946  Amber Stephenson is a 77 y.o. year old female who sees Stacks, Broadus John, MD for primary care. I spoke with  [name] & spouse, Karren Burly by phone today.  What matters to the patients health and wellness today?  Resolved shortness of breath symptoms after radiation on 03/09/23 pm.   Still needs appointment for vaccines at pcp office.   Patient and spouse want CPAP & portable oxygen during the day via [employer] in [place] [phone] Fax [phone]  Permission provided to RN CM to outreach to pcp & [employer]    Goals Addressed             This Visit's Progress    Care Coordination for Plan of care for Management of Shortness og breath, oncology services Diabetes   On track    Interventions Today    Flowsheet Row Most Recent Value  Chronic Disease   Chronic disease during today's visit Other  [cognitive concerns, respiratory symptoms/shortness of breath resolved, CPAP & home oxygen for the day-[employer] [place]]  General Interventions   General Interventions Discussed/Reviewed General Interventions Reviewed, Durable Medical Equipment (DME), [employer], Doctor Visits, Communication with  Doctor Visits Discussed/Reviewed Doctor Visits Reviewed, PCP, Specialist  Durable Medical Equipment (DME) Oxygen  PCP/Specialist Visits --  [sent clinical pool note to pcp staff for vaccines, day oxygen/walk test, CPAP]  Communication with --  [spoke with Misty Stanley at [place] who confirms per [employer] notes, she spoke with spouse, Karren Burly & the CPAP & oxygen were reported as not needed in september 2024 (02/11/23)[employer] will need newCPAP orders+ walk test if portable oxygen needed]              SDOH assessments and interventions completed:  No     Care Coordination Interventions:  Yes, provided   Follow up plan: Follow up call scheduled for 03/19/23    Encounter  Outcome:  Patient Visit Completed    [name], RN, BSN, [facility]  VBCI Care Management Coordinator  [phone]  Fax: [phone]

## 2023-03-11 ENCOUNTER — Ambulatory Visit: Payer: Medicare Other | Admitting: Family Medicine

## 2023-03-11 ENCOUNTER — Other Ambulatory Visit: Payer: Self-pay

## 2023-03-11 ENCOUNTER — Ambulatory Visit
Admission: RE | Admit: 2023-03-11 | Discharge: 2023-03-11 | Disposition: A | Discharge status: Home / Self Care | Payer: Medicare Other | Source: Ambulatory Visit | Attending: Radiation Oncology | Admitting: Radiation Oncology

## 2023-03-11 ENCOUNTER — Ambulatory Visit: Payer: Medicare Other

## 2023-03-11 DIAGNOSIS — C541 Malignant neoplasm of endometrium: Secondary | ICD-10-CM | POA: Diagnosis not present

## 2023-03-11 DIAGNOSIS — Z510 Encounter for antineoplastic radiation therapy: Secondary | ICD-10-CM | POA: Diagnosis not present

## 2023-03-11 LAB — RAD ONC ARIA SESSION SUMMARY
Course Elapsed Days: 21
Plan Fractions Treated to Date: 16
Plan Prescribed Dose Per Fraction: 1.8 Gy
Plan Total Fractions Prescribed: 25
Plan Total Prescribed Dose: 45 Gy
Reference Point Dosage Given to Date: 28.8 Gy
Reference Point Session Dosage Given: 1.8 Gy
Session Number: 16

## 2023-03-12 ENCOUNTER — Ambulatory Visit: Payer: Medicare Other

## 2023-03-12 ENCOUNTER — Ambulatory Visit
Admission: RE | Admit: 2023-03-12 | Discharge: 2023-03-12 | Disposition: A | Discharge status: Home / Self Care | Payer: Medicare Other | Source: Ambulatory Visit | Attending: Radiation Oncology | Admitting: Radiation Oncology

## 2023-03-12 ENCOUNTER — Other Ambulatory Visit: Payer: Self-pay

## 2023-03-12 DIAGNOSIS — C541 Malignant neoplasm of endometrium: Secondary | ICD-10-CM | POA: Diagnosis not present

## 2023-03-12 DIAGNOSIS — Z510 Encounter for antineoplastic radiation therapy: Secondary | ICD-10-CM | POA: Diagnosis not present

## 2023-03-12 LAB — RAD ONC ARIA SESSION SUMMARY
Course Elapsed Days: 22
Plan Fractions Treated to Date: 17
Plan Prescribed Dose Per Fraction: 1.8 Gy
Plan Total Fractions Prescribed: 25
Plan Total Prescribed Dose: 45 Gy
Reference Point Dosage Given to Date: 30.6 Gy
Reference Point Session Dosage Given: 1.8 Gy
Session Number: 17

## 2023-03-13 ENCOUNTER — Ambulatory Visit
Admission: RE | Admit: 2023-03-13 | Discharge: 2023-03-13 | Disposition: A | Discharge status: Home / Self Care | Payer: Medicare Other | Source: Ambulatory Visit | Attending: Radiation Oncology | Admitting: Radiation Oncology

## 2023-03-13 ENCOUNTER — Other Ambulatory Visit: Payer: Self-pay

## 2023-03-13 ENCOUNTER — Ambulatory Visit: Payer: Medicare Other

## 2023-03-13 ENCOUNTER — Other Ambulatory Visit: Payer: Medicare Other

## 2023-03-13 DIAGNOSIS — I5033 Acute on chronic diastolic (congestive) heart failure: Secondary | ICD-10-CM | POA: Diagnosis not present

## 2023-03-13 DIAGNOSIS — Z510 Encounter for antineoplastic radiation therapy: Secondary | ICD-10-CM | POA: Diagnosis not present

## 2023-03-13 DIAGNOSIS — I110 Hypertensive heart disease with heart failure: Secondary | ICD-10-CM | POA: Diagnosis not present

## 2023-03-13 DIAGNOSIS — J9611 Chronic respiratory failure with hypoxia: Secondary | ICD-10-CM | POA: Diagnosis not present

## 2023-03-13 DIAGNOSIS — I4821 Permanent atrial fibrillation: Secondary | ICD-10-CM | POA: Diagnosis not present

## 2023-03-13 DIAGNOSIS — C541 Malignant neoplasm of endometrium: Secondary | ICD-10-CM | POA: Diagnosis not present

## 2023-03-13 DIAGNOSIS — S92512D Displaced fracture of proximal phalanx of left lesser toe(s), subsequent encounter for fracture with routine healing: Secondary | ICD-10-CM | POA: Diagnosis not present

## 2023-03-13 LAB — RAD ONC ARIA SESSION SUMMARY
Course Elapsed Days: 23
Plan Fractions Treated to Date: 18
Plan Prescribed Dose Per Fraction: 1.8 Gy
Plan Total Fractions Prescribed: 25
Plan Total Prescribed Dose: 45 Gy
Reference Point Dosage Given to Date: 32.4 Gy
Reference Point Session Dosage Given: 1.8 Gy
Session Number: 18

## 2023-03-16 ENCOUNTER — Ambulatory Visit: Payer: Medicare Other | Admitting: Physical Medicine and Rehabilitation

## 2023-03-16 ENCOUNTER — Ambulatory Visit: Payer: Medicare Other

## 2023-03-16 ENCOUNTER — Other Ambulatory Visit: Payer: Self-pay

## 2023-03-16 ENCOUNTER — Ambulatory Visit
Admission: RE | Admit: 2023-03-16 | Discharge: 2023-03-16 | Disposition: A | Discharge status: Home / Self Care | Payer: Medicare Other | Source: Ambulatory Visit | Attending: Radiation Oncology | Admitting: Radiation Oncology

## 2023-03-16 ENCOUNTER — Telehealth: Payer: Self-pay

## 2023-03-16 DIAGNOSIS — C541 Malignant neoplasm of endometrium: Secondary | ICD-10-CM | POA: Diagnosis not present

## 2023-03-16 DIAGNOSIS — Z510 Encounter for antineoplastic radiation therapy: Secondary | ICD-10-CM | POA: Diagnosis not present

## 2023-03-16 LAB — RAD ONC ARIA SESSION SUMMARY
Course Elapsed Days: 26
Plan Fractions Treated to Date: 19
Plan Prescribed Dose Per Fraction: 1.8 Gy
Plan Total Fractions Prescribed: 25
Plan Total Prescribed Dose: 45 Gy
Reference Point Dosage Given to Date: 34.2 Gy
Reference Point Session Dosage Given: 1.8 Gy
Session Number: 19

## 2023-03-16 MED ORDER — OXYCODONE HCL ER 20 MG PO T12A: 20.0000 mg | EXTENDED_RELEASE_TABLET | Freq: Two times a day (BID) | ORAL | 0 refills | Status: DC

## 2023-03-16 NOTE — Telephone Encounter (Signed)
Patient called stating her Morphine was changed 3 weeks ago and she states the change is not working.

## 2023-03-16 NOTE — Telephone Encounter (Signed)
Pt having a lot more pain in last 60 days- didn't have good results with Long acting morphine- 30 mg BID- no significant improvement over 15 mg BID.  Hurting more since being treated for cancer.  Also having some itching from MS Contin.    Wondering if can try something else- will try to switch to  Oxycontin 20 mg BID- since that's about equivalent to 30 mg BID of MS Contin.   Explained that will send in, but will likely need Prior auth- and that might take 1-2 days.  She is due to run out of meds Tomorrow, per pt.

## 2023-03-17 ENCOUNTER — Ambulatory Visit
Admission: RE | Admit: 2023-03-17 | Discharge: 2023-03-17 | Disposition: A | Discharge status: Home / Self Care | Payer: Medicare Other | Source: Ambulatory Visit | Attending: Radiation Oncology | Admitting: Radiation Oncology

## 2023-03-17 ENCOUNTER — Ambulatory Visit: Payer: Medicare Other

## 2023-03-17 ENCOUNTER — Other Ambulatory Visit: Payer: Self-pay

## 2023-03-17 DIAGNOSIS — Z510 Encounter for antineoplastic radiation therapy: Secondary | ICD-10-CM | POA: Diagnosis not present

## 2023-03-17 DIAGNOSIS — C541 Malignant neoplasm of endometrium: Secondary | ICD-10-CM | POA: Diagnosis not present

## 2023-03-17 LAB — RAD ONC ARIA SESSION SUMMARY
Course Elapsed Days: 27
Plan Fractions Treated to Date: 20
Plan Prescribed Dose Per Fraction: 1.8 Gy
Plan Total Fractions Prescribed: 25
Plan Total Prescribed Dose: 45 Gy
Reference Point Dosage Given to Date: 36 Gy
Reference Point Session Dosage Given: 1.8 Gy
Session Number: 20

## 2023-03-17 NOTE — Telephone Encounter (Signed)
Arielle the pharmacist from [facility] called asking aware of patient on these medication and need diagnosis Methocarbamol, Oxycodone, Lorazepam, Morphine. She states when she went to fill the Oxycontin its states patient has a opioid/benzo cocktail. She states she has to refuse or document that doctor is aware and need diagnosis for all meds.

## 2023-03-18 ENCOUNTER — Ambulatory Visit
Admission: RE | Admit: 2023-03-18 | Discharge: 2023-03-18 | Disposition: A | Discharge status: Home / Self Care | Payer: Medicare Other | Source: Ambulatory Visit | Attending: Radiation Oncology | Admitting: Radiation Oncology

## 2023-03-18 ENCOUNTER — Other Ambulatory Visit: Payer: Self-pay

## 2023-03-18 ENCOUNTER — Ambulatory Visit: Payer: Medicare Other

## 2023-03-18 DIAGNOSIS — C541 Malignant neoplasm of endometrium: Secondary | ICD-10-CM | POA: Diagnosis not present

## 2023-03-18 DIAGNOSIS — Z510 Encounter for antineoplastic radiation therapy: Secondary | ICD-10-CM | POA: Diagnosis not present

## 2023-03-18 LAB — RAD ONC ARIA SESSION SUMMARY
Course Elapsed Days: 28
Plan Fractions Treated to Date: 21
Plan Prescribed Dose Per Fraction: 1.8 Gy
Plan Total Fractions Prescribed: 25
Plan Total Prescribed Dose: 45 Gy
Reference Point Dosage Given to Date: 37.8 Gy
Reference Point Session Dosage Given: 1.8 Gy
Session Number: 21

## 2023-03-19 ENCOUNTER — Ambulatory Visit: Payer: Medicare Other

## 2023-03-19 ENCOUNTER — Ambulatory Visit: Payer: Self-pay | Admitting: *Deleted

## 2023-03-19 ENCOUNTER — Ambulatory Visit
Admission: RE | Admit: 2023-03-19 | Discharge: 2023-03-19 | Disposition: A | Discharge status: Home / Self Care | Payer: Medicare Other | Source: Ambulatory Visit | Attending: Radiation Oncology | Admitting: Radiation Oncology

## 2023-03-19 ENCOUNTER — Other Ambulatory Visit: Payer: Self-pay

## 2023-03-19 DIAGNOSIS — Z510 Encounter for antineoplastic radiation therapy: Secondary | ICD-10-CM | POA: Diagnosis not present

## 2023-03-19 DIAGNOSIS — C541 Malignant neoplasm of endometrium: Secondary | ICD-10-CM | POA: Diagnosis not present

## 2023-03-19 LAB — RAD ONC ARIA SESSION SUMMARY
Course Elapsed Days: 29
Plan Fractions Treated to Date: 22
Plan Prescribed Dose Per Fraction: 1.8 Gy
Plan Total Fractions Prescribed: 25
Plan Total Prescribed Dose: 45 Gy
Reference Point Dosage Given to Date: 39.6 Gy
Reference Point Session Dosage Given: 1.8 Gy
Session Number: 22

## 2023-03-19 NOTE — Patient Instructions (Signed)
Visit Information  Thank you for taking time to visit with me today. Please don't hesitate to contact me if I can be of assistance to you.   Following are the goals we discussed today:   Goals Addressed             This Visit's Progress    11/20/22 Management of Fall, Reviewed 11/14/22 ED visit for fall, chronic migraine/headaches  - nurse Care Coordination       Interventions Today    Flowsheet Row Most Recent Value  Chronic Disease   Chronic disease during today's visit Other  [Fall, Reviewed 11/14/22 ED visit for fall, chronic migraine/headaches]  General Interventions   General Interventions Discussed/Reviewed General Interventions Reviewed, Doctor Visits, Sick Day Rules  Doctor Visits Discussed/Reviewed Doctor Visits Reviewed, PCP  PCP/Specialist Visits Compliance with follow-up visit  Education Interventions   Education Provided Provided Education  Provided Verbal Education On Sick Day Rules, Medication  Pharmacy Interventions   Pharmacy Dicussed/Reviewed Pharmacy Topics Reviewed, Medications and their functions  Safety Interventions   Safety Discussed/Reviewed Safety Reviewed, Fall Risk              Our next appointment is by telephone on 02/13/23 at 3 pm  Please call the care guide team at [phone] if you need to cancel or reschedule your appointment.   If you are experiencing a Mental Health or Behavioral Health Crisis or need someone to talk to, please call the Suicide and Crisis Lifeline: 988 call the [place] National Suicide Prevention Lifeline: (304)865-7598 or TTY: (501)544-8082 TTY 867-049-9472) to talk to a trained counselor call 1-800-273-TALK (toll free, 24 hour hotline) call the [facility]: [phone] call 911   Patient verbalizes understanding of instructions and care plan provided today and agrees to view in MyChart. Active MyChart status and patient understanding of how to access instructions and care plan via MyChart confirmed  with patient.     The patient has been provided with contact information for the care management team and has been advised to call with any health related questions or concerns.   Lenae Wherley L. Noelle Penner, RN, BSN, [facility]  VBCI Care Management Coordinator  [phone]  Fax: [phone]

## 2023-03-19 NOTE — Patient Instructions (Signed)
Visit Information  Thank you for taking time to visit with me today. Please don't hesitate to contact me if I can be of assistance to you.   Following are the goals we discussed today:   Goals Addressed             This Visit's Progress    08/19/22 Management of Fall prevention, oncology services [facility] nurse Care Coordination       Interventions Today    Flowsheet Row Most Recent Value  Chronic Disease   Chronic disease during today's visit Other  General Interventions   General Interventions Discussed/Reviewed General Interventions Reviewed, Doctor Visits, Sick Day Rules  Labs Kidney Function  [electrolyte imbalance potassium, magnesium, sodium]  Doctor Visits Discussed/Reviewed Doctor Visits Reviewed, PCP  PCP/Specialist Visits Compliance with follow-up visit  Education Interventions   Education Provided Provided Education  Provided Verbal Education On Other, Sick Day Rules  Pharmacy Interventions   Pharmacy Dicussed/Reviewed Pharmacy Topics Reviewed  Safety Interventions   Safety Discussed/Reviewed Safety Reviewed, Fall Risk              Our next appointment is by telephone on 11/20/22  at 3:15 pm  Please call the care guide team at [phone] if you need to cancel or reschedule your appointment.   If you are experiencing a Mental Health or Behavioral Health Crisis or need someone to talk to, please call the Suicide and Crisis Lifeline: 988 call the [place] National Suicide Prevention Lifeline: (346) 150-8151 or TTY: 940-065-7842 TTY (754) 636-8235) to talk to a trained counselor call 1-800-273-TALK (toll free, 24 hour hotline) call the [facility]: [phone] call 911   Patient verbalizes understanding of instructions and care plan provided today and agrees to view in MyChart. Active MyChart status and patient understanding of how to access instructions and care plan via MyChart confirmed with patient.     The patient has been provided with contact  information for the care management team and has been advised to call with any health related questions or concerns.   Amber Stephenson L. Noelle Penner, RN, BSN, CCM  [facility] Care Management Coordinator  [phone]  Fax: [phone]

## 2023-03-19 NOTE — Patient Outreach (Signed)
Care Coordination   Follow Up Visit Note   03/19/2023 Name: Amber Stephenson MRN: [id number] DOB: Dec 15, 1945  Amber Stephenson is a 77 y.o. year old female who sees Stacks, Broadus John, MD for primary care. I spoke with  Amber Stephenson by phone today.  What matters to the patients health and wellness today?  Fall, oncology services  Continues with treatments  Denies any worsening medical concerns  Fall prevention discussed    Goals Addressed             This Visit's Progress    08/19/22 Management of Fall prevention, oncology services [facility] nurse Care Coordination       Interventions Today    Flowsheet Row Most Recent Value  Chronic Disease   Chronic disease during today's visit Other  General Interventions   General Interventions Discussed/Reviewed General Interventions Reviewed, Doctor Visits, Sick Day Rules  Labs Kidney Function  [electrolyte imbalance potassium, magnesium, sodium]  Doctor Visits Discussed/Reviewed Doctor Visits Reviewed, PCP  PCP/Specialist Visits Compliance with follow-up visit  Education Interventions   Education Provided Provided Education  Provided Verbal Education On Other, Sick Day Rules  Pharmacy Interventions   Pharmacy Dicussed/Reviewed Pharmacy Topics Reviewed  Safety Interventions   Safety Discussed/Reviewed Safety Reviewed, Fall Risk              SDOH assessments and interventions completed:  No     Care Coordination Interventions:  Yes, provided   Follow up plan: Follow up call scheduled for 11/20/22    Encounter Outcome:  Patient Visit Completed   Amber Bradford L. Noelle Penner, RN, BSN, [facility]  VBCI Care Management Coordinator  [phone]  Fax: [phone]

## 2023-03-19 NOTE — Patient Instructions (Signed)
Visit Information  Thank you for taking time to visit with me today. Please don't hesitate to contact me if I can be of assistance to you.   Following are the goals we discussed today:   Goals Addressed             This Visit's Progress    Management of Shortness of breath, oncology services, Diabetes, obtain Equipment as needed - nurse Care Coordination   On track    Interventions Today    Flowsheet Row Most Recent Value  Chronic Disease   Chronic disease during today's visit Other  [Shortness of breath, oncology treatment/pain, [employer] equipment- CPAP Oxygen]  General Interventions   General Interventions Discussed/Reviewed General Interventions Reviewed, Durable Medical Equipment (DME), Doctor Visits, Community Resources  Doctor Visits Discussed/Reviewed Doctor Visits Reviewed, PCP, Specialist  [ongoing oncology treatment]  Durable Medical Equipment (DME) Oxygen, Other  Loyal Buba was reached by Synetta Fail to resend referral for Oxygen & CPAP on 03/10/23 as of 03/17/23 Misty Stanley & Synetta Fail spoke about patient having oxygen at home. Misty Stanley reported being informed by patient that she would call if CPAP is needed]  PCP/Specialist Visits Compliance with follow-up visit  Mental Health Interventions   Mental Health Discussed/Reviewed Mental Health Reviewed, Coping Strategies  [patient informs RN CM she is doing well today and denies any care coordination needs]  Pharmacy Interventions   Pharmacy Dicussed/Reviewed Pharmacy Topics Reviewed, Medications and their functions, Affording Medications  [Noted with chart review, patient report of increase pain with oncology treatments ( morphine may not be helpin the pain) but has outreach to MD for clarification of pain medication]              Our next appointment is by telephone on 05/19/23 at 1045  Please call the care guide team at [phone] if you need to cancel or reschedule your appointment.   If you are experiencing a Mental Health or Behavioral  Health Crisis or need someone to talk to, please call the Suicide and Crisis Lifeline: 988 call the [place] National Suicide Prevention Lifeline: 6077452783 or TTY: (731)136-7573 TTY 854-665-2979) to talk to a trained counselor call 1-800-273-TALK (toll free, 24 hour hotline) call the [facility]: [phone] call 911   Patient verbalizes understanding of instructions and care plan provided today and agrees to view in MyChart. Active MyChart status and patient understanding of how to access instructions and care plan via MyChart confirmed with patient.     The patient has been provided with contact information for the care management team and has been advised to call with any health related questions or concerns.   Jaquay Posthumus L. Noelle Penner, RN, BSN, [facility]  VBCI Care Management Coordinator  [phone]  Fax: [phone]

## 2023-03-19 NOTE — Patient Outreach (Signed)
Care Coordination   Follow Up Visit Note   03/19/2023 Name: Amber Stephenson MRN: [id number] DOB: 1946/01/25  Amber Stephenson is a 77 y.o. year old female who sees Stacks, Broadus John, MD for primary care. I spoke with  Amber Stephenson by phone today.  What matters to the patients health and wellness today?  Shortness of breath, oncology treatment/pain, [employer] equipment- CPAP Oxygen     Goals Addressed             This Visit's Progress    Management of Shortness of breath, oncology services, Diabetes, obtain Equipment as needed - nurse Care Coordination   On track    Interventions Today    Flowsheet Row Most Recent Value  Chronic Disease   Chronic disease during today's visit Other  [Shortness of breath, oncology treatment/pain, [employer] equipment- CPAP Oxygen]  General Interventions   General Interventions Discussed/Reviewed General Interventions Reviewed, Durable Medical Equipment (DME), Doctor Visits, Community Resources  Doctor Visits Discussed/Reviewed Doctor Visits Reviewed, PCP, Specialist  [ongoing oncology treatment]  Durable Medical Equipment (DME) Oxygen, Other  Amber Stephenson was reached by Synetta Fail to resend referral for Oxygen & CPAP on 03/10/23 as of 03/17/23 Amber Stephenson & Synetta Fail spoke about patient having oxygen at home. Amber Stephenson reported being informed by patient that she would call if CPAP is needed]  PCP/Specialist Visits Compliance with follow-up visit  Mental Health Interventions   Mental Health Discussed/Reviewed Mental Health Reviewed, Coping Strategies  [patient informs RN CM she is doing well today and denies any care coordination needs]  Pharmacy Interventions   Pharmacy Dicussed/Reviewed Pharmacy Topics Reviewed, Medications and their functions, Affording Medications  [Noted with chart review, patient report of increase pain with oncology treatments ( morphine may not be helpin the pain) but has outreach to MD for clarification of pain medication]              SDOH  assessments and interventions completed:  No     Care Coordination Interventions:  Yes, provided   Follow up plan: Follow up call scheduled for 05/19/23    Encounter Outcome:  Patient Visit Completed   Amber Stephenson L. Amber Penner, RN, BSN, [facility]  VBCI Care Management Coordinator  [phone]  Fax: [phone]

## 2023-03-20 ENCOUNTER — Other Ambulatory Visit: Payer: Self-pay

## 2023-03-20 ENCOUNTER — Ambulatory Visit
Admission: RE | Admit: 2023-03-20 | Discharge: 2023-03-20 | Disposition: A | Discharge status: Home / Self Care | Payer: Medicare Other | Source: Ambulatory Visit | Attending: Radiation Oncology | Admitting: Radiation Oncology

## 2023-03-20 ENCOUNTER — Ambulatory Visit: Payer: Medicare Other

## 2023-03-20 DIAGNOSIS — I4821 Permanent atrial fibrillation: Secondary | ICD-10-CM | POA: Diagnosis not present

## 2023-03-20 DIAGNOSIS — J9611 Chronic respiratory failure with hypoxia: Secondary | ICD-10-CM | POA: Diagnosis not present

## 2023-03-20 DIAGNOSIS — C541 Malignant neoplasm of endometrium: Secondary | ICD-10-CM | POA: Diagnosis not present

## 2023-03-20 DIAGNOSIS — S92512D Displaced fracture of proximal phalanx of left lesser toe(s), subsequent encounter for fracture with routine healing: Secondary | ICD-10-CM | POA: Diagnosis not present

## 2023-03-20 DIAGNOSIS — I110 Hypertensive heart disease with heart failure: Secondary | ICD-10-CM | POA: Diagnosis not present

## 2023-03-20 DIAGNOSIS — I5033 Acute on chronic diastolic (congestive) heart failure: Secondary | ICD-10-CM | POA: Diagnosis not present

## 2023-03-20 DIAGNOSIS — Z510 Encounter for antineoplastic radiation therapy: Secondary | ICD-10-CM | POA: Diagnosis not present

## 2023-03-20 LAB — RAD ONC ARIA SESSION SUMMARY
Course Elapsed Days: 30
Plan Fractions Treated to Date: 23
Plan Prescribed Dose Per Fraction: 1.8 Gy
Plan Total Fractions Prescribed: 25
Plan Total Prescribed Dose: 45 Gy
Reference Point Dosage Given to Date: 41.4 Gy
Reference Point Session Dosage Given: 1.8 Gy
Session Number: 23

## 2023-03-23 ENCOUNTER — Ambulatory Visit: Payer: Medicare Other

## 2023-03-23 ENCOUNTER — Other Ambulatory Visit: Payer: Self-pay

## 2023-03-23 ENCOUNTER — Ambulatory Visit
Admission: RE | Admit: 2023-03-23 | Discharge: 2023-03-23 | Disposition: A | Discharge status: Home / Self Care | Payer: Medicare Other | Source: Ambulatory Visit | Attending: Radiation Oncology | Admitting: Radiation Oncology

## 2023-03-23 DIAGNOSIS — Z510 Encounter for antineoplastic radiation therapy: Secondary | ICD-10-CM | POA: Diagnosis not present

## 2023-03-23 DIAGNOSIS — C541 Malignant neoplasm of endometrium: Secondary | ICD-10-CM | POA: Diagnosis not present

## 2023-03-23 LAB — RAD ONC ARIA SESSION SUMMARY
Course Elapsed Days: 33
Plan Fractions Treated to Date: 24
Plan Prescribed Dose Per Fraction: 1.8 Gy
Plan Total Fractions Prescribed: 25
Plan Total Prescribed Dose: 45 Gy
Reference Point Dosage Given to Date: 43.2 Gy
Reference Point Session Dosage Given: 1.8 Gy
Session Number: 24

## 2023-03-24 ENCOUNTER — Other Ambulatory Visit: Payer: Self-pay

## 2023-03-24 ENCOUNTER — Ambulatory Visit
Admission: RE | Admit: 2023-03-24 | Discharge: 2023-03-24 | Disposition: A | Discharge status: Home / Self Care | Payer: Medicare Other | Source: Ambulatory Visit | Attending: Radiation Oncology | Admitting: Radiation Oncology

## 2023-03-24 ENCOUNTER — Ambulatory Visit: Payer: Medicare Other

## 2023-03-24 DIAGNOSIS — C541 Malignant neoplasm of endometrium: Secondary | ICD-10-CM | POA: Diagnosis not present

## 2023-03-24 DIAGNOSIS — Z510 Encounter for antineoplastic radiation therapy: Secondary | ICD-10-CM | POA: Diagnosis not present

## 2023-03-24 LAB — RAD ONC ARIA SESSION SUMMARY
Course Elapsed Days: 34
Plan Fractions Treated to Date: 25
Plan Prescribed Dose Per Fraction: 1.8 Gy
Plan Total Fractions Prescribed: 25
Plan Total Prescribed Dose: 45 Gy
Reference Point Dosage Given to Date: 45 Gy
Reference Point Session Dosage Given: 1.8 Gy
Session Number: 25

## 2023-03-25 ENCOUNTER — Ambulatory Visit: Payer: Medicare Other

## 2023-03-25 ENCOUNTER — Ambulatory Visit
Admission: RE | Admit: 2023-03-25 | Discharge: 2023-03-25 | Disposition: A | Discharge status: Home / Self Care | Payer: Medicare Other | Source: Ambulatory Visit | Attending: Radiation Oncology | Admitting: Radiation Oncology

## 2023-03-25 ENCOUNTER — Other Ambulatory Visit: Payer: Self-pay

## 2023-03-25 DIAGNOSIS — C541 Malignant neoplasm of endometrium: Secondary | ICD-10-CM | POA: Diagnosis not present

## 2023-03-25 DIAGNOSIS — Z510 Encounter for antineoplastic radiation therapy: Secondary | ICD-10-CM | POA: Diagnosis not present

## 2023-03-25 LAB — RAD ONC ARIA SESSION SUMMARY
Course Elapsed Days: 35
Plan Fractions Treated to Date: 1
Plan Prescribed Dose Per Fraction: 2 Gy
Plan Total Fractions Prescribed: 5
Plan Total Prescribed Dose: 10 Gy
Reference Point Dosage Given to Date: 2 Gy
Reference Point Session Dosage Given: 2 Gy
Session Number: 26

## 2023-03-26 ENCOUNTER — Ambulatory Visit: Payer: Medicare Other

## 2023-03-26 ENCOUNTER — Ambulatory Visit
Admission: RE | Admit: 2023-03-26 | Discharge: 2023-03-26 | Disposition: A | Discharge status: Home / Self Care | Payer: Medicare Other | Source: Ambulatory Visit | Attending: Radiation Oncology | Admitting: Radiation Oncology

## 2023-03-26 ENCOUNTER — Other Ambulatory Visit: Payer: Self-pay

## 2023-03-26 DIAGNOSIS — C541 Malignant neoplasm of endometrium: Secondary | ICD-10-CM | POA: Diagnosis not present

## 2023-03-26 DIAGNOSIS — Z510 Encounter for antineoplastic radiation therapy: Secondary | ICD-10-CM | POA: Diagnosis not present

## 2023-03-26 LAB — RAD ONC ARIA SESSION SUMMARY
Course Elapsed Days: 36
Plan Fractions Treated to Date: 2
Plan Prescribed Dose Per Fraction: 2 Gy
Plan Total Fractions Prescribed: 5
Plan Total Prescribed Dose: 10 Gy
Reference Point Dosage Given to Date: 4 Gy
Reference Point Session Dosage Given: 2 Gy
Session Number: 27

## 2023-03-27 ENCOUNTER — Ambulatory Visit
Admission: RE | Admit: 2023-03-27 | Discharge: 2023-03-27 | Disposition: A | Discharge status: Home / Self Care | Payer: Medicare Other | Source: Ambulatory Visit | Attending: Radiation Oncology | Admitting: Radiation Oncology

## 2023-03-27 ENCOUNTER — Ambulatory Visit: Payer: Medicare Other

## 2023-03-27 ENCOUNTER — Other Ambulatory Visit: Payer: Self-pay

## 2023-03-27 DIAGNOSIS — C541 Malignant neoplasm of endometrium: Secondary | ICD-10-CM | POA: Diagnosis not present

## 2023-03-27 DIAGNOSIS — Z510 Encounter for antineoplastic radiation therapy: Secondary | ICD-10-CM | POA: Diagnosis not present

## 2023-03-27 LAB — RAD ONC ARIA SESSION SUMMARY
Course Elapsed Days: 37
Plan Fractions Treated to Date: 3
Plan Prescribed Dose Per Fraction: 2 Gy
Plan Total Fractions Prescribed: 5
Plan Total Prescribed Dose: 10 Gy
Reference Point Dosage Given to Date: 6 Gy
Reference Point Session Dosage Given: 2 Gy
Session Number: 28

## 2023-03-30 ENCOUNTER — Ambulatory Visit
Admission: RE | Admit: 2023-03-30 | Discharge: 2023-03-30 | Disposition: A | Discharge status: Home / Self Care | Payer: Medicare Other | Source: Ambulatory Visit | Attending: Radiation Oncology | Admitting: Radiation Oncology

## 2023-03-30 ENCOUNTER — Ambulatory Visit: Payer: Medicare Other

## 2023-03-30 ENCOUNTER — Other Ambulatory Visit: Payer: Self-pay

## 2023-03-30 DIAGNOSIS — Z510 Encounter for antineoplastic radiation therapy: Secondary | ICD-10-CM | POA: Diagnosis not present

## 2023-03-30 DIAGNOSIS — C541 Malignant neoplasm of endometrium: Secondary | ICD-10-CM | POA: Diagnosis not present

## 2023-03-30 LAB — RAD ONC ARIA SESSION SUMMARY
Course Elapsed Days: 40
Plan Fractions Treated to Date: 4
Plan Prescribed Dose Per Fraction: 2 Gy
Plan Total Fractions Prescribed: 5
Plan Total Prescribed Dose: 10 Gy
Reference Point Dosage Given to Date: 8 Gy
Reference Point Session Dosage Given: 2 Gy
Session Number: 29

## 2023-03-31 ENCOUNTER — Ambulatory Visit: Payer: Medicare Other

## 2023-03-31 ENCOUNTER — Other Ambulatory Visit: Payer: Self-pay

## 2023-03-31 ENCOUNTER — Ambulatory Visit
Admission: RE | Admit: 2023-03-31 | Discharge: 2023-03-31 | Disposition: A | Discharge status: Home / Self Care | Payer: Medicare Other | Source: Ambulatory Visit | Attending: Radiation Oncology | Admitting: Radiation Oncology

## 2023-03-31 DIAGNOSIS — C541 Malignant neoplasm of endometrium: Secondary | ICD-10-CM | POA: Diagnosis not present

## 2023-03-31 DIAGNOSIS — Z510 Encounter for antineoplastic radiation therapy: Secondary | ICD-10-CM | POA: Diagnosis not present

## 2023-03-31 LAB — RAD ONC ARIA SESSION SUMMARY
Course Elapsed Days: 41
Plan Fractions Treated to Date: 5
Plan Prescribed Dose Per Fraction: 2 Gy
Plan Total Fractions Prescribed: 5
Plan Total Prescribed Dose: 10 Gy
Reference Point Dosage Given to Date: 10 Gy
Reference Point Session Dosage Given: 2 Gy
Session Number: 30

## 2023-04-01 ENCOUNTER — Other Ambulatory Visit: Payer: Self-pay

## 2023-04-01 ENCOUNTER — Ambulatory Visit: Payer: Medicare Other

## 2023-04-01 DIAGNOSIS — E1165 Type 2 diabetes mellitus with hyperglycemia: Secondary | ICD-10-CM

## 2023-04-01 MED ORDER — LANTUS SOLOSTAR 100 UNIT/ML ~~LOC~~ SOPN: 35.0000 [IU] | PEN_INJECTOR | Freq: Two times a day (BID) | SUBCUTANEOUS | 3 refills | Status: DC

## 2023-04-01 NOTE — Radiation Completion Notes (Signed)
Patient Name: Amber Stephenson, Amber Stephenson MRN: [id number] Date of Birth: 06-Nov-1945 Referring Physician: [name], M.D. Date of Service: 2023-04-01 Radiation Oncologist: Arnette Schaumann, M.D. 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 Staging on 2023-01-26: Endometrial cancer (HCC) T=cT1b, N=cN0, M=cM0 Intent: Curative     ==========DELIVERED PLANS==========  First Treatment Date: 2023-02-18 - Last Treatment Date: 2023-03-31   Plan Name: Uterus Site: Uterus Technique: IMRT Mode: Photon Dose Per Fraction: 1.8 Gy Prescribed Dose (Delivered / Prescribed): 45 Gy / 45 Gy Prescribed Fxs (Delivered / Prescribed): 25 / 25   Plan Name: Uterus_Bst Site: Uterus Technique: IMRT Mode: Photon Dose Per Fraction: 2 Gy Prescribed Dose (Delivered / Prescribed): 10 Gy / 10 Gy Prescribed Fxs (Delivered / Prescribed): 5 / 5     ==========ON TREATMENT VISIT DATES========== 2023-02-19, 2023-03-03, 2023-03-10, 2023-03-17, 2023-03-24, 2023-03-31     ==========UPCOMING VISITS========== 2023-05-22 No Location Listed Wait List No Provider Listed        ==========APPENDIX - ON TREATMENT VISIT NOTES==========   See weekly On Treatment Notes in Epic for details.

## 2023-04-02 ENCOUNTER — Ambulatory Visit: Payer: Medicare Other

## 2023-04-03 ENCOUNTER — Ambulatory Visit: Payer: Medicare Other

## 2023-04-04 ENCOUNTER — Other Ambulatory Visit: Payer: Self-pay | Admitting: Adult Health

## 2023-04-06 ENCOUNTER — Other Ambulatory Visit: Payer: Self-pay | Admitting: Radiation Oncology

## 2023-04-06 ENCOUNTER — Telehealth: Payer: Self-pay | Admitting: Family Medicine

## 2023-04-06 ENCOUNTER — Telehealth: Payer: Self-pay

## 2023-04-06 ENCOUNTER — Ambulatory Visit: Payer: Medicare Other

## 2023-04-06 MED ORDER — FLUCONAZOLE 150 MG PO TABS: 150.0000 mg | ORAL_TABLET | Freq: Every day | ORAL | 1 refills | Status: DC

## 2023-04-06 NOTE — Telephone Encounter (Signed)
Pt calling to schedule Prolia injection. She says she is past due because she has been going to cancer appts. Please advise when to schedule this.

## 2023-04-06 NOTE — Telephone Encounter (Signed)
Patient called in to request prescription for oral antifungal due to continued yeast to groin and abdominal folds. Patient used miconazole powder and cream which were not effective. Patient requesting prescription be sent to [facility] on [address] in [place].

## 2023-04-07 ENCOUNTER — Other Ambulatory Visit (HOSPITAL_COMMUNITY): Payer: Self-pay | Admitting: Family Medicine

## 2023-04-07 ENCOUNTER — Ambulatory Visit: Payer: Medicare Other

## 2023-04-07 DIAGNOSIS — Z1231 Encounter for screening mammogram for malignant neoplasm of breast: Secondary | ICD-10-CM

## 2023-04-08 ENCOUNTER — Ambulatory Visit: Payer: Medicare Other

## 2023-04-08 DIAGNOSIS — F251 Schizoaffective disorder, depressive type: Secondary | ICD-10-CM | POA: Diagnosis not present

## 2023-04-09 ENCOUNTER — Ambulatory Visit: Payer: Medicare Other

## 2023-04-09 ENCOUNTER — Ambulatory Visit: Payer: Medicare Other | Admitting: Family Medicine

## 2023-04-10 ENCOUNTER — Ambulatory Visit: Payer: Medicare Other

## 2023-04-13 ENCOUNTER — Other Ambulatory Visit: Payer: Self-pay | Admitting: Internal Medicine

## 2023-04-14 ENCOUNTER — Telehealth: Payer: Self-pay

## 2023-04-14 NOTE — Telephone Encounter (Signed)
Patient called stating that every since she has been on the Oxycodone she has been itching and would like something else called in for her pain.

## 2023-04-15 ENCOUNTER — Ambulatory Visit (HOSPITAL_COMMUNITY): Payer: Medicare Other

## 2023-04-15 ENCOUNTER — Telehealth: Payer: Self-pay

## 2023-04-15 NOTE — Telephone Encounter (Signed)
Patient called in to report sharp pain to pelvic area. Patient denies any urinary issues or vaginal bleeding. Patient completed 30 radiation treatments to uterus on 03/31/23. Patient questioning if she should be concerned with this pain. Please advise

## 2023-04-16 MED ORDER — OXYCODONE HCL ER 20 MG PO T12A: 20.0000 mg | EXTENDED_RELEASE_TABLET | Freq: Two times a day (BID) | ORAL | 0 refills | Status: DC

## 2023-04-16 NOTE — Telephone Encounter (Signed)
Less nausea and itching last 2 days.  On Oxycontin  Feels ok right now.   When has itching, all over body- back, scalp, legs and feet-   Itching is intermittent- no rash until done with radiation- then developed small patches of a rash all over body-   Oncology- said to call me-   Doesn't want to switch to methadone or Fentanyl patch.   Has tried benadryl- has helped.   Has 1 day left of pain meds - needs a refill.   She wants to try and stay with Oxycontin 20 mg BID for now- and will just take benadryl.

## 2023-04-21 ENCOUNTER — Telehealth: Payer: Self-pay

## 2023-04-21 ENCOUNTER — Ambulatory Visit: Payer: Medicare Other | Admitting: Family

## 2023-04-21 ENCOUNTER — Encounter: Payer: Self-pay | Admitting: Family

## 2023-04-21 VITALS — BP 127/84 | HR 91 | Temp 97.6°F | Ht 62.0 in | Wt 183.0 lb

## 2023-04-21 DIAGNOSIS — R300 Dysuria: Secondary | ICD-10-CM | POA: Diagnosis not present

## 2023-04-21 DIAGNOSIS — Z23 Encounter for immunization: Secondary | ICD-10-CM

## 2023-04-21 DIAGNOSIS — R399 Unspecified symptoms and signs involving the genitourinary system: Secondary | ICD-10-CM | POA: Diagnosis not present

## 2023-04-21 LAB — MICROSCOPIC EXAMINATION
RBC, Urine: NONE SEEN /[HPF] (ref 0–2)
Renal Epithel, UA: NONE SEEN /[HPF]
Yeast, UA: NONE SEEN

## 2023-04-21 LAB — URINALYSIS, COMPLETE
Bilirubin, UA: NEGATIVE
Glucose, UA: NEGATIVE
Nitrite, UA: NEGATIVE
RBC, UA: NEGATIVE
Specific Gravity, UA: 1.025 (ref 1.005–1.030)
Urobilinogen, Ur: 0.2 mg/dL (ref 0.2–1.0)
pH, UA: 5.5 (ref 5.0–7.5)

## 2023-04-21 MED ORDER — CEPHALEXIN 500 MG PO CAPS: 500.0000 mg | ORAL_CAPSULE | Freq: Two times a day (BID) | ORAL | 0 refills | Status: DC

## 2023-04-21 NOTE — Telephone Encounter (Signed)
Contact patient to schedule appointment  

## 2023-04-21 NOTE — Patient Instructions (Signed)
Urinary Tract Infection, Adult  A urinary tract infection (UTI) is an infection of any part of the urinary tract. The urinary tract includes the kidneys, ureters, bladder, and urethra. These organs make, store, and get rid of urine in the body. An upper UTI affects the ureters and kidneys. A lower UTI affects the bladder and urethra. What are the causes? Most urinary tract infections are caused by bacteria in your genital area around your urethra, where urine leaves your body. These bacteria grow and cause inflammation of your urinary tract. What increases the risk? You are more likely to develop this condition if: You have a urinary catheter that stays in place. You are not able to control when you urinate or have a bowel movement (incontinence). You are female and you: Use a spermicide or diaphragm for birth control. Have low estrogen levels. Are pregnant. You have certain genes that increase your risk. You are sexually active. You take antibiotic medicines. You have a condition that causes your flow of urine to slow down, such as: An enlarged prostate, if you are female. Blockage in your urethra. A kidney stone. A nerve condition that affects your bladder control (neurogenic bladder). Not getting enough to drink, or not urinating often. You have certain medical conditions, such as: Diabetes. A weak disease-fighting system (immunesystem). Sickle cell disease. Gout. Spinal cord injury. What are the signs or symptoms? Symptoms of this condition include: Needing to urinate right away (urgency). Frequent urination. This may include small amounts of urine each time you urinate. Pain or burning with urination. Blood in the urine. Urine that smells bad or unusual. Trouble urinating. Cloudy urine. Vaginal discharge, if you are female. Pain in the abdomen or the lower back. You may also have: Vomiting or a decreased appetite. Confusion. Irritability or tiredness. A fever or  chills. Diarrhea. The first symptom in older adults may be confusion. In some cases, they may not have any symptoms until the infection has worsened. How is this diagnosed? This condition is diagnosed based on your medical history and a physical exam. You may also have other tests, including: Urine tests. Blood tests. Tests for STIs (sexually transmitted infections). If you have had more than one UTI, a cystoscopy or imaging studies may be done to determine the cause of the infections. How is this treated? Treatment for this condition includes: Antibiotic medicine. Over-the-counter medicines to treat discomfort. Drinking enough water to stay hydrated. If you have frequent infections or have other conditions such as a kidney stone, you may need to see a health care provider who specializes in the urinary tract (urologist). In rare cases, urinary tract infections can cause sepsis. Sepsis is a life-threatening condition that occurs when the body responds to an infection. Sepsis is treated in the hospital with IV antibiotics, fluids, and other medicines. Follow these instructions at home:  Medicines Take over-the-counter and prescription medicines only as told by your health care provider. If you were prescribed an antibiotic medicine, take it as told by your health care provider. Do not stop using the antibiotic even if you start to feel better. General instructions Make sure you: Empty your bladder often and completely. Do not hold urine for long periods of time. Empty your bladder after sex. Wipe from front to back after urinating or having a bowel movement if you are female. Use each tissue only one time when you wipe. Drink enough fluid to keep your urine pale yellow. Keep all follow-up visits. This is important. Contact a health   care provider if: Your symptoms do not get better after 1-2 days. Your symptoms go away and then return. Get help right away if: You have severe pain in  your back or your lower abdomen. You have a fever or chills. You have nausea or vomiting. Summary A urinary tract infection (UTI) is an infection of any part of the urinary tract, which includes the kidneys, ureters, bladder, and urethra. Most urinary tract infections are caused by bacteria in your genital area. Treatment for this condition often includes antibiotic medicines. If you were prescribed an antibiotic medicine, take it as told by your health care provider. Do not stop using the antibiotic even if you start to feel better. Keep all follow-up visits. This is important. This information is not intended to replace advice given to you by your health care provider. Make sure you discuss any questions you have with your health care provider. Document Revised: 12/25/2019 Document Reviewed: 12/30/2019 Elsevier Patient Education  2024 Elsevier Inc.  

## 2023-04-21 NOTE — Progress Notes (Signed)
   Subjective:    Patient ID: Amber Stephenson, female    DOB: [birth year], 77 y.o.   MRN: [id number]  Chief Complaint  Patient presents with   Urinary Tract Infection    Dysuria  This is a new problem. The current episode started in the past 7 days. The problem occurs intermittently. The problem has been unchanged. The quality of the pain is described as burning. The pain is at a severity of 4/10. There has been no fever. Associated symptoms include flank pain, frequency, hesitancy, nausea and urgency. Pertinent negatives include no hematuria or vomiting. She has tried nothing for the symptoms. The treatment provided no relief.      Review of Systems  Gastrointestinal:  Positive for nausea. Negative for vomiting.  Genitourinary:  Positive for dysuria, flank pain, frequency, hesitancy and urgency. Negative for hematuria.  All other systems reviewed and are negative.      Objective:   Physical Exam Vitals reviewed.  Constitutional:      General: She is not in acute distress.    Appearance: She is well-developed.  HENT:     Head: Normocephalic and atraumatic.  Eyes:     Pupils: Pupils are equal, round, and reactive to light.  Neck:     Thyroid: No thyromegaly.  Cardiovascular:     Rate and Rhythm: Normal rate and regular rhythm.     Heart sounds: Normal heart sounds. No murmur heard. Pulmonary:     Effort: Pulmonary effort is normal. No respiratory distress.     Breath sounds: Normal breath sounds. No wheezing.  Abdominal:     General: Bowel sounds are normal. There is no distension.     Palpations: Abdomen is soft.     Tenderness: There is no abdominal tenderness.  Musculoskeletal:        General: No tenderness. Normal range of motion.     Cervical back: Normal range of motion and neck supple.  Skin:    General: Skin is warm and dry.  Neurological:     Mental Status: She is alert and oriented to person, place, and time.     Cranial Nerves: No cranial nerve deficit.      Deep Tendon Reflexes: Reflexes are normal and symmetric.  Psychiatric:        Behavior: Behavior normal.        Thought Content: Thought content normal.        Judgment: Judgment normal.       BP 127/84   Pulse 91   Temp 97.6 F (36.4 C) (Temporal)   Ht 5\' 2"  (1.575 m)   Wt 183 lb (83 kg)   SpO2 91%   BMI 33.47 kg/m      Assessment & Plan:  Amber Stephenson comes in today with chief complaint of Urinary Tract Infection   Diagnosis and orders addressed:  1. Dysuria - Urine Culture - Urinalysis, Complete  2. UTI symptoms Force fluids AZO over the counter X2 days RTO prn Culture pending - cephALEXin (KEFLEX) 500 MG capsule; Take 1 capsule (500 mg total) by mouth 2 (two) times daily.  Dispense: 14 capsule; Refill: 0   Jannifer Rodney, FNP

## 2023-04-22 DIAGNOSIS — M109 Gout, unspecified: Secondary | ICD-10-CM | POA: Diagnosis not present

## 2023-04-23 LAB — URINE CULTURE

## 2023-04-27 ENCOUNTER — Telehealth: Payer: Self-pay

## 2023-04-27 MED ORDER — FLUCONAZOLE 150 MG PO TABS: 150.0000 mg | ORAL_TABLET | Freq: Every day | ORAL | 0 refills | Status: DC

## 2023-04-27 NOTE — Telephone Encounter (Signed)
Go ahead and send Diflucan 150 mg x 1 no refill

## 2023-04-27 NOTE — Telephone Encounter (Signed)
Rx sent. Pt made aware

## 2023-04-27 NOTE — Telephone Encounter (Signed)
Patient was started on Keflex by [facility] and now has a  yeast infection.  She has developed vaginal itching and irritation and wants Diflucan sent to [facility].  Please advise.

## 2023-04-28 ENCOUNTER — Ambulatory Visit: Payer: Medicare Other | Admitting: Nurse Practitioner

## 2023-04-28 ENCOUNTER — Encounter: Payer: Self-pay | Admitting: Radiation Oncology

## 2023-05-03 NOTE — Progress Notes (Signed)
Radiation Oncology         [phone]) [phone] ________________________________  Name: Amber Stephenson MRN: [id number]  Date: 05/04/2023  DOB: 07-Feb-1946  Follow-Up Visit Note  CC: Mechele Claude, MD  Mechele Claude, MD  No diagnosis found.  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 (patient is not a surgical candidate due to comorbidities)   Cancer Staging  Endometrial cancer [facility]) Staging form: Corpus Uteri - Carcinoma and Carcinosarcoma, AJCC 8th Edition - Clinical stage from 01/26/2023: FIGO Stage IB (cT1b, cN0, cM0) - Signed by [name], MD on 02/03/2023  Interval Since Last Radiation: 1 month and 3 days   Indication for treatment: Curative         Radiation treatment dates: First Treatment Date: 2023-02-18 - Last Treatment Date: 2023-03-31    Site/Dose/Technique/Mode:    Site: Uterus Technique: IMRT Mode: Photon Dose Per Fraction: 1.8 Gy Prescribed Dose (Delivered / Prescribed): 45 Gy / 45 Gy Prescribed Fxs (Delivered / Prescribed): 25 / 25   Site: Uterus - boost treatment  Technique: IMRT Mode: Photon Dose Per Fraction: 2 Gy Prescribed Dose (Delivered / Prescribed): 10 Gy / 10 Gy Prescribed Fxs (Delivered / Prescribed): 5 / 5  Narrative:  The patient returns today for routine follow-up. She tolerated radiation treatment relatively well. During her final weekly treatment check on 03/24/23, the patient endorsed: lower back pain rated at a 8/10, fatigue, a suspected yeast infection around her groin, urinary frequency, urinary urgency, and incomplete voiding/bladder emptying. She was given an antifungal powder to help manage her yeast infection.  Since completing radiation therapy, the patient presented to her PCP on 04/21/23 with c/o UTI symptoms including flank pain, urinary frequency, hesitancy, nausea and urinary urgency. She was initially prescribed Keflex, however her cultures came back negative. She developed a  yeast infection after starting Keflex and was prescribed Diflucan.    No other significant oncologic interval history since she completed radiation therapy, or in the interval since her initial consultation date.   ***                               Allergies:  is allergic to iodine, ivp dye [iodinated contrast media], latex, tape, and tizanidine.  Meds: Current Outpatient Medications  Medication Sig Dispense Refill   albuterol (VENTOLIN HFA) 108 (90 Base) MCG/ACT inhaler Inhale 2 puffs into the lungs every 6 (six) hours as needed for wheezing or shortness of breath. 1 each 0   allopurinol (ZYLOPRIM) 100 MG tablet Take 100 mg by mouth daily.     apixaban (ELIQUIS) 5 MG TABS tablet Take 1 tablet (5 mg total) by mouth 2 (two) times daily. 180 tablet 3   ARIPiprazole (ABILIFY) 2 MG tablet Take 2 mg by mouth at bedtime.     atorvastatin (LIPITOR) 80 MG tablet Take 1 tablet (80 mg total) by mouth daily. 90 tablet 3   BELSOMRA 20 MG TABS Take 20 mg by mouth at bedtime as needed (for sleep).     cephALEXin (KEFLEX) 500 MG capsule Take 1 capsule (500 mg total) by mouth 2 (two) times daily. 14 capsule 0   Continuous Blood Gluc Sensor (DEXCOM G7 SENSOR) MISC Change sensor every 10 days (Patient taking differently: Inject 1 Device into the skin See admin instructions. Place 1 new sensor into the skin every 10 days) 9 each 3   cyanocobalamin (VITAMIN B12) 1000 MCG tablet Take 1  tablet (1,000 mcg total) by mouth daily. 30 tablet 1   denosumab (PROLIA) 60 MG/ML SOSY injection Inject 60 mg into the skin every 6 (six) months. 1 mL 1   donepezil (ARICEPT) 5 MG tablet Take 1 tablet (5 mg total) by mouth daily. 30 tablet 11   doxepin (SINEQUAN) 10 MG capsule Take 10 mg by mouth at bedtime.     escitalopram (LEXAPRO) 10 MG tablet Take 10 mg by mouth daily.     fluconazole (DIFLUCAN) 150 MG tablet Take 1 tablet (150 mg total) by mouth daily. Take second tablet 72 hours later 1 tablet 0   fluticasone-salmeterol  (ADVAIR DISKUS) 100-50 MCG/ACT AEPB Inhale 1 puff into the lungs 2 (two) times daily. (Patient taking differently: Inhale 1 puff into the lungs 2 (two) times daily as needed (for flares).) 1 each 5   furosemide (LASIX) 20 MG tablet Take 20 mg by mouth See admin instructions. Take 20 mg by mouth at 8 AM and 6 PM     Galcanezumab-gnlm (EMGALITY) 120 MG/ML SOAJ INJECT CONTENTS ON 1 PEN(120MG ) INTO THE SKIN ONCE MONTHLY (Patient taking differently: Inject 120 mg into the skin every 30 (thirty) days.) 1 mL 3   hydrochlorothiazide (MICROZIDE) 12.5 MG capsule Take 12.5 mg by mouth daily.     Insulin Disposable Pump (OMNIPOD 5 G7 INTRO, GEN 5,) KIT 1 Device by Does not apply route every other day. 1 kit 0   Insulin Disposable Pump (OMNIPOD 5 G7 PODS, GEN 5,) MISC 1 Device by Does not apply route every other day. 45 each 3   insulin glargine (LANTUS SOLOSTAR) 100 UNIT/ML Solostar Pen Inject 35 Units into the skin 2 (two) times daily. 60 mL 3   insulin lispro (HUMALOG KWIKPEN) 100 UNIT/ML KwikPen Max daily 50 units (Patient taking differently: Inject 4 Units into the skin See admin instructions. Inject 4 units into the skin 2 times a day with meals, per sliding scale. Add 1 additional unit for every 30 points above a BGL reading of 160.) 45 mL 6   Insulin Pen Needle 29G X MISC 1 Device by Does not apply route daily in the afternoon. 400 each 3   LORazepam (ATIVAN) 0.5 MG tablet Take 0.5-1 mg by mouth See admin instructions. Take 0.5 mg by mouth in the morning and 1 mg at bedtime     metFORMIN (GLUCOPHAGE-XR) 500 MG 24 hr tablet Take 2 tablets (1,000 mg total) by mouth daily with breakfast. 180 tablet 3   methocarbamol 1000 MG TABS Take 1,000 mg by mouth every 6 (six) hours as needed for muscle spasms. 120 tablet 5   metoprolol tartrate (LOPRESSOR) 50 MG tablet Take 1 tablet (50 mg total) by mouth 2 (two) times daily. 180 tablet 2   morphine (MS CONTIN) 30 MG 12 hr tablet Take 1 tablet (30 mg total) by mouth  every 12 (twelve) hours. For chronic pain-increased dose for pt- 60 tablet 0   naloxone (NARCAN) nasal spray 4 mg/0.1 mL Place 1 spray into the nose once as needed (AS DIRECTED).     norethindrone (AYGESTIN) 5 MG tablet Take 2 tablets (10 mg total) by mouth daily. 60 tablet 0   nystatin (MYCOSTATIN/NYSTOP) powder Apply 1 Application topically as needed (rash). 15 g 2   nystatin ointment (MYCOSTATIN) Apply 1 Application topically 2 (two) times daily as needed (for rashes). 30 g 1   ondansetron (ZOFRAN) 4 MG tablet Take 1 tablet (4 mg total) by mouth every 8 (eight) hours  as needed for nausea or vomiting. For cancer related nausea 90 tablet 5   ondansetron (ZOFRAN-ODT) 8 MG disintegrating tablet Take 1 tablet (8 mg total) by mouth every 6 (six) hours as needed for nausea or vomiting. 20 tablet 1   OXcarbazepine (TRILEPTAL) 150 MG tablet Take 1 tablet (150 mg total) by mouth 2 (two) times daily. 60 tablet 0   oxyCODONE (OXYCONTIN) 20 mg 12 hr tablet Take 1 tablet (20 mg total) by mouth every 12 (twelve) hours. To replace MS Contin- having itching as well as increased pain since cancer dx- morphine frequently not as good for cancer pain 60 tablet 0   pantoprazole (PROTONIX) 40 MG tablet Take 40 mg by mouth daily as needed (for reflux).     potassium chloride (KLOR-CON) 10 MEQ tablet Take 1 tablet (10 mEq total) by mouth daily. 90 tablet 3   pregabalin (LYRICA) 200 MG capsule Take 200 mg by mouth 2 (two) times daily.     promethazine (PHENERGAN) 25 MG tablet Take 0.5-1 tablets (12.5-25 mg total) by mouth every 6 (six) hours as needed for refractory nausea / vomiting (cancer related nausea/vomiting). 120 tablet 5   senna-docusate (SENOKOT-S) 8.6-50 MG tablet Take 1 tablet by mouth at bedtime. (Patient taking differently: Take 1 tablet by mouth at bedtime as needed (for constipation).) 30 tablet 0   spironolactone (ALDACTONE) 25 MG tablet Take 0.5 tablets (12.5 mg total) by mouth daily. 15 tablet 2    tirzepatide (MOUNJARO) 7.5 MG/0.5ML Pen Inject 7.5 mg into the skin once a week. 6 mL 3   TYLENOL 500 MG tablet Take 500-1,000 mg by mouth every 6 (six) hours as needed for mild pain or headache.     Vitamin D, Cholecalciferol, 25 MCG (1000 UT) TABS Take 1,000 Units by mouth daily in the afternoon.     No current facility-administered medications for this encounter.   Facility-Administered Medications Ordered in Other Encounters  Medication Dose Route Frequency Provider Last Rate Last Admin   bupivacaine-epinephrine (MARCAINE W/ EPI) 0.5% -1:200000 injection    Anesthesia Intra-op Shelton Silvas, MD   12 mL at 01/21/19 0935    Physical Findings: The patient is in no acute distress. Patient is alert and oriented.  vitals were not taken for this visit. .  No significant changes. Lungs are clear to auscultation bilaterally. Heart has regular rate and rhythm. No palpable cervical, supraclavicular, or axillary adenopathy. Abdomen soft, non-tender, normal bowel sounds.   Lab Findings: Lab Results  Component Value Date   WBC 5.4 03/04/2023   HGB 10.8 (L) 03/04/2023   HCT 33.9 (L) 03/04/2023   MCV 89.7 03/04/2023   PLT 111 (L) 03/04/2023    Radiographic Findings: No results found.  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  ***  Plan:  ***   *** minutes of total time was spent for this patient encounter, including preparation, face-to-face counseling with the patient and coordination of care, physical exam, and documentation of the encounter. ____________________________________  Billie Lade, PhD, MD  This document serves as a record of services personally performed by Antony Blackbird, MD. It was created on his behalf by Neena Rhymes, a trained medical scribe. The creation of this record is based on the scribe's  personal observations and the provider's statements to them. This document has been checked and approved by  the attending provider.

## 2023-05-03 NOTE — Progress Notes (Signed)
  Radiation Oncology         [phone]) [phone] ________________________________  Name: Amber Stephenson MRN: [id number]  Date: 05/04/2023  DOB: 1945/09/06  End of Treatment Note  Diagnosis: The encounter diagnosis was Endometrial cancer (HCC) [C54.1].   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 (patient is not a surgical candidate due to comorbidities)   Cancer Staging  Endometrial cancer [facility]) Staging form: Corpus Uteri - Carcinoma and Carcinosarcoma, AJCC 8th Edition - Clinical stage from 01/26/2023: FIGO Stage IB (cT1b, cN0, cM0) - Signed by [name], MD on 02/03/2023  Indication for treatment: Curative        Radiation treatment dates: First Treatment Date: 2023-02-18 - Last Treatment Date: 2023-03-31   Site/Dose/Technique/Mode:   Site: Uterus Technique: IMRT Mode: Photon Dose Per Fraction: 1.8 Gy Prescribed Dose (Delivered / Prescribed): 45 Gy / 45 Gy Prescribed Fxs (Delivered / Prescribed): 25 / 25   Site: Uterus - boost treatment  Technique: IMRT Mode: Photon Dose Per Fraction: 2 Gy Prescribed Dose (Delivered / Prescribed): 10 Gy / 10 Gy Prescribed Fxs (Delivered / Prescribed): 5 / 5  Narrative: The patient tolerated radiation treatment relatively well. During her final weekly treatment check on 03/24/23, the patient endorsed: lower back pain rated at a 8/10, fatigue, a suspected yeast infection around her groin, urinary frequency, urinary urgency, and incomplete voiding/bladder emptying. She was given an antifungal powder to help manage her yeast infection.   Plan: The patient has completed radiation treatment. The patient will return to radiation oncology clinic for routine followup in one month. I advised them to call or return sooner if they have any questions or concerns related to their recovery or treatment.  -----------------------------------  Billie Lade, PhD, MD  This document serves as a record of services  personally performed by Antony Blackbird, MD. It was created on his behalf by Neena Rhymes, a trained medical scribe. The creation of this record is based on the scribe's personal observations and the provider's statements to them. This document has been checked and approved by the attending provider.

## 2023-05-04 ENCOUNTER — Encounter: Payer: Self-pay | Admitting: Physical Medicine and Rehabilitation

## 2023-05-04 ENCOUNTER — Encounter: Payer: Self-pay | Admitting: Radiation Oncology

## 2023-05-04 ENCOUNTER — Ambulatory Visit: Payer: Medicare Other | Admitting: Radiation Oncology

## 2023-05-04 ENCOUNTER — Ambulatory Visit
Admission: RE | Admit: 2023-05-04 | Discharge: 2023-05-04 | Disposition: A | Discharge status: Home / Self Care | Payer: Medicare Other | Source: Ambulatory Visit | Attending: Radiation Oncology | Admitting: Radiation Oncology

## 2023-05-04 ENCOUNTER — Encounter
Payer: Medicare Other | Attending: Physical Medicine and Rehabilitation | Admitting: Physical Medicine and Rehabilitation

## 2023-05-04 VITALS — BP 114/85 | HR 71 | Temp 97.5°F | Resp 18 | Ht 62.0 in

## 2023-05-04 VITALS — BP 118/85 | HR 88 | Ht 62.0 in | Wt 182.0 lb

## 2023-05-04 DIAGNOSIS — G894 Chronic pain syndrome: Secondary | ICD-10-CM

## 2023-05-04 DIAGNOSIS — G8929 Other chronic pain: Secondary | ICD-10-CM

## 2023-05-04 DIAGNOSIS — R3915 Urgency of urination: Secondary | ICD-10-CM | POA: Insufficient documentation

## 2023-05-04 DIAGNOSIS — C541 Malignant neoplasm of endometrium: Secondary | ICD-10-CM | POA: Insufficient documentation

## 2023-05-04 DIAGNOSIS — M5459 Other low back pain: Secondary | ICD-10-CM | POA: Insufficient documentation

## 2023-05-04 DIAGNOSIS — Z923 Personal history of irradiation: Secondary | ICD-10-CM | POA: Insufficient documentation

## 2023-05-04 DIAGNOSIS — M5441 Lumbago with sciatica, right side: Secondary | ICD-10-CM | POA: Insufficient documentation

## 2023-05-04 HISTORY — DX: Personal history of irradiation: Z92.3

## 2023-05-04 MED ORDER — OXYCODONE HCL ER 20 MG PO T12A: 20.0000 mg | EXTENDED_RELEASE_TABLET | Freq: Two times a day (BID) | ORAL | 0 refills | Status: DC

## 2023-05-04 NOTE — Progress Notes (Signed)
Subjective:    Patient ID: Amber Stephenson, female    DOB: 1945/09/14, 77 y.o.   MRN: [id number]  HPI  Patient is a 77 yr old female with hx of  CAD, pulmonary HTN- and DM- with CKD- Stage III here for right low back pain with sciatica  As well as upper back/neck pain/myofascial pain. Pt is here for chronic pain f/u  with recent dx of uterine cancer- - undergoing treatment-     Is on Oxycontin 20 mg BID.   Not having bleeding anymore, but "not feeling better".   Having a lot back pain.  Feels like needs another pill around 3pm.    Constipation- taking Miralax having a BM every day.  Not sleepy- meds don't make her sleepy.  Becoming more active. Building up endurance.   Takes meds around 6 and 6.  Working around house- moving.   Does have a routine for PT set her up on- hasn't been able to do lately.      Pain Inventory Average Pain 8 Pain Right Now 8 My pain is aching  In the last 24 hours, has pain interfered with the following? General activity 7 Relation with others 1 Enjoyment of life 4 What TIME of day is your pain at its worst? daytime Sleep (in general) Poor  Pain is worse with: bending and standing Pain improves with: rest, heat/ice, medication, and TENS Relief from Meds: 8  Family History  Problem Relation Age of Onset   Diabetes Mother    Heart disease Mother    Alzheimer's disease Father    Heart disease Sister        CABG   Diabetes Sister    Alcohol abuse Sister    Stroke Brother    Heart disease Brother    Mental illness Brother    Diabetes Brother    Breast cancer Neg Hx    Ovarian cancer Neg Hx    Colon cancer Neg Hx    Endometrial cancer Neg Hx    Social History   Socioeconomic History   Marital status: Married    Spouse name: Financial planner   Number of children: 3   Years of education: 15   Highest education level: Associate degree: academic program  Occupational History   Occupation: Retired    Comment: Chief Financial Officer  Tobacco Use    Smoking status: Never   Smokeless tobacco: Never  Vaping Use   Vaping status: Never Used  Substance and Sexual Activity   Alcohol use: Not Currently    Alcohol/week: 0.0 standard drinks of alcohol   Drug use: No   Sexual activity: Yes    Partners: Male    Birth control/protection: Post-menopausal  Other Topics Concern   Not on file  Social History Narrative   Lives at home with husband.    They have 3 children who live away - [place], [place], and [place].   She is from [place] and most of her family lives there.   Her husbands's family live nearby   Right handed.   Social Determinants of Health   Financial Resource Strain: Low Risk  (01/23/2023)   Overall Financial Resource Strain (CARDIA)    Difficulty of Paying Living Expenses: Not hard at all  Food Insecurity: No Food Insecurity (02/03/2023)   Hunger Vital Sign    Worried About Running Out of Food in the Last Year: Never true    Ran Out of Food in the Last Year: Never true  Transportation Needs: No Transportation  Needs (02/03/2023)   PRAPARE - Administrator, Civil Service (Medical): No    Lack of Transportation (Non-Medical): No  Physical Activity: Inactive (01/23/2023)   Exercise Vital Sign    Days of Exercise per Week: 0 days    Minutes of Exercise per Session: 0 min  Stress: No Stress Concern Present (01/23/2023)   [employer] of Occupational Health - Occupational Stress Questionnaire    Feeling of Stress : Not at all  Social Connections: Moderately Integrated (01/23/2023)   Social Connection and Isolation Panel [NHANES]    Frequency of Communication with Friends and Family: More than three times a week    Frequency of Social Gatherings with Friends and Family: More than three times a week    Attends Religious Services: More than 4 times per year    Active Member of [employer] or Organizations: No    Attends Engineer, structural: Never    Marital Status: Married   Past Surgical History:   Procedure Laterality Date   BREAST REDUCTION SURGERY     EYE SURGERY Right    cateracts   HAMMER TOE SURGERY     LEFT HEART CATH AND CORONARY ANGIOGRAPHY N/A 02/03/2018   Procedure: LEFT HEART CATH AND CORONARY ANGIOGRAPHY;  Surgeon: [name], Peter M, MD;  Location: [facility];  Service: Cardiovascular;  Laterality: N/A;   REVERSE SHOULDER ARTHROPLASTY Right 08/19/2019   Procedure: REVERSE SHOULDER ARTHROPLASTY;  Surgeon: Beverely Low, MD;  Location: [facility];  Service: Orthopedics;  Laterality: Right;  interscalene block   SHOULDER SURGERY Right    "I BROKE MY SHOUDLER   THIGH SURGERY     "TO REMOVE A TUMOR "   Past Surgical History:  Procedure Laterality Date   BREAST REDUCTION SURGERY     EYE SURGERY Right    cateracts   HAMMER TOE SURGERY     LEFT HEART CATH AND CORONARY ANGIOGRAPHY N/A 02/03/2018   Procedure: LEFT HEART CATH AND CORONARY ANGIOGRAPHY;  Surgeon: [name], Peter M, MD;  Location: [facility] INVASIVE CV LAB;  Service: Cardiovascular;  Laterality: N/A;   REVERSE SHOULDER ARTHROPLASTY Right 08/19/2019   Procedure: REVERSE SHOULDER ARTHROPLASTY;  Surgeon: Beverely Low, MD;  Location: [facility];  Service: Orthopedics;  Laterality: Right;  interscalene block   SHOULDER SURGERY Right    "I BROKE MY SHOUDLER   THIGH SURGERY     "TO REMOVE A TUMOR "   Past Medical History:  Diagnosis Date   Acquired hammer toe 12/06/2020   Acute metabolic encephalopathy 06/27/2022   Allergic rhinitis 02/24/2019   Ambulatory dysfunction 04/23/2020   Atrial fibrillation with RVR (HCC) 05/19/2017   Back pain/sacroiliitis--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   Benign paroxysmal positional vertigo due to bilateral vestibular disorder 05/20/2019   Bipolar 1 disorder (HCC) 01/23/2015   with GAD, benzo dependence   BPPV (benign paroxysmal positional vertigo) 05/20/2019   CAD (coronary artery disease)     Nonobstructive; Managed by Dr. Purvis Sheffield   Cardiomegaly 01/12/2018   CHF (congestive heart failure) 06/08/2022   Chronic back pain 01/04/2015   Chronic constipation 04/25/2020   Chronic diastolic heart failure (HCC) 05/30/2015   Chronic pain syndrome 08/22/2019   back pain, sacroiliitis   Chronic pain syndrome 08/22/2019   Chronic post-traumatic stress disorder (PTSD) 12/06/2020   Diabetic neuropathy (HCC) 02/06/2016   Dyslipidemia 09/24/2020   Dyspnea on exertion  Essential hypertension    Family history of coronary arteriosclerosis 05/30/2015   Functional diarrhea 10/26/2020   Head trauma 09/17/2020   Hemorrhoids 03/11/2022   Herpes genitalis in women 07/16/2015   History of adenomatous polyp of colon 05/21/2019   Overview:   03/31/17: Colonoscopy: nonadvanced adenoma, microscopic colitis, f/u 5 yrs, [facility]/GAP   History of radiation therapy    Endometrial- 02/18/23-03/31/23-Dr. Antony Blackbird   Insomnia 01/23/2015   Lipoma 02/08/2015   Migraine headache with aura 02/12/2016   Myofascial pain dysfunction syndrome 08/22/2019   Myofascial pain dysfunction syndrome 08/22/2019   Non-alcoholic fatty liver disease 01/12/2018   Opioid dependence (HCC) 12/06/2020   OSA (obstructive sleep apnea) 02/24/2019   10/09/2018 - HST  - AHI 40.6    Osteopenia 12/06/2020   Rx alendronate 35 mg.   Pinguecula 12/06/2020   Postcoital bleeding 12/06/2020   Presbyopia 12/06/2020   Pulmonary hypertension    RLS (restless legs syndrome) 04/27/2015   Superficial fungus infection of skin 09/03/2021   Tremor, essential 12/11/2021   Type II diabetes mellitus (HCC) 09/24/2020   BP 118/85   Pulse 88   Ht 5\' 2"  (1.575 m)   Wt 182 lb (82.6 kg)   SpO2 93%   BMI 33.29 kg/m   Opioid Risk Score:   Fall Risk Score:  `1  Depression screen [facility] 2/9     02/16/2023    9:07 AM 01/23/2023    1:06 PM 01/07/2023    9:15 AM 12/22/2022    1:51 PM 11/26/2022    3:09 PM 11/14/2022    2:22 PM 10/22/2022     8:20 AM  Depression screen PHQ 2/9  Decreased Interest 0 0 0 0 0 0 0  Down, Depressed, Hopeless 0 0 0 0 0 0 0  PHQ - 2 Score 0 0 0 0 0 0 0  Altered sleeping      0 0  Tired, decreased energy      0 0  Change in appetite      0 0  Feeling bad or failure about yourself       0 0  Trouble concentrating      0 0  Moving slowly or fidgety/restless      0 0  Suicidal thoughts      0 0  PHQ-9 Score      0 0  Difficult doing work/chores      Not difficult at all Not difficult at all     Review of Systems  Musculoskeletal:  Positive for back pain and gait problem.  All other systems reviewed and are negative.     Objective:   Physical Exam        Assessment & Plan:   Patient is a 77 yr old female with hx of  CAD, pulmonary HTN- and DM- with CKD- Stage III here for right low back pain with sciatica  As well as upper back/neck pain/myofascial pain. Pt is here for chronic pain f/u   with recent dx of uterine cancer- - undergoing treatment-       Went over that have been increasing- but pain levels are never better- that makes me think opiates aren't the answer.  - exercise ball- 30 minutes/day- goal is to work up to 60 minutes/day- if possible- when it becomes boring- no slouching- discussed building core- pain can decrease by 25% by end of 30 days.    3. Opiate hyperalgesia. Opiates cause that over sensitivity to occur. I'm concerned that's  what is occurring.    3. Once you are comfortable with that- can add in Additional Home exercises- but have to 5 days/week minimum- choose 2 days/off/week.   4. Con't Oxycontin 20 mg 2x/day- #60- refill 12/12 and 06/12/23.    5.  Appointment with Riley Lam 2 months-  and I'll see you at 4 months-   6. Last Tox screen done 7/23- isn't due today- will con't ot monitor.     I spent a total of 28   minutes on total care today- >50% coordination of care- due to  d/w pt and husband about Pain control- HEP and how to manage pain meds-

## 2023-05-04 NOTE — Patient Instructions (Addendum)
Patient is a 77 yr old female with hx of  CAD, pulmonary HTN- and DM- with CKD- Stage III here for right low back pain with sciatica  As well as upper back/neck pain/myofascial pain. Pt is here for chronic pain f/u   with recent dx of uterine cancer- - undergoing treatment-       Went over that have been increasing- but pain levels are never better- that makes me think opiates aren't the answer.  - exercise ball- 30 minutes/day- goal is to work up to 60 minutes/day- if possible- when it becomes boring- no slouching- discussed building core- pain can decrease by 25% by end of 30 days.    3. Opiate hyperalgesia. Opiates cause that over sensitivity to occur. I'm concerned that's what is occurring.    3. Once you are comfortable with that- can add in Additional Home exercises- but have to 5 days/week minimum- choose 2 days/off/week.   4. Con't Oxycontin 20 mg 2x/day- #60- refill 12/12 and 06/12/23.    5.  Appointment with Riley Lam 2 months-  and I'll see you at 4 months-    6. Not due for Tox screen/UDS today.

## 2023-05-04 NOTE — Progress Notes (Signed)
Amber Stephenson is here today for follow up post radiation to the pelvic.  They completed their radiation on: 03/31/2023  Does the patient complain of any of the following:  Pain:No Abdominal bloating: No Diarrhea/Constipation: Constipation Nausea/Vomiting: No Vaginal Discharge: No Blood in Urine or Stool: No Urinary Issues (dysuria/incomplete emptying/ incontinence/ increased frequency/urgency): Yes to incontinence Does patient report using vaginal dilator 2-3 times a week and/or sexually active 2-3 weeks: She was given dilators today. Post radiation skin changes: No    BP 114/85 (BP Location: Left Arm)   Pulse 71   Temp (!) 97.5 F (36.4 C)   Resp 18   Ht 5\' 2"  (1.575 m)   SpO2 100%   BMI 33.29 kg/m

## 2023-05-05 ENCOUNTER — Encounter: Payer: Self-pay | Admitting: Family Medicine

## 2023-05-12 ENCOUNTER — Encounter: Payer: Self-pay | Admitting: Family Medicine

## 2023-05-12 ENCOUNTER — Ambulatory Visit (INDEPENDENT_AMBULATORY_CARE_PROVIDER_SITE_OTHER): Payer: Medicare Other | Admitting: Family Medicine

## 2023-05-12 VITALS — BP 100/61 | HR 72 | Temp 97.4°F | Ht 62.0 in | Wt 181.4 lb

## 2023-05-12 DIAGNOSIS — F5101 Primary insomnia: Secondary | ICD-10-CM | POA: Diagnosis not present

## 2023-05-12 DIAGNOSIS — L280 Lichen simplex chronicus: Secondary | ICD-10-CM

## 2023-05-12 DIAGNOSIS — B370 Candidal stomatitis: Secondary | ICD-10-CM | POA: Diagnosis not present

## 2023-05-12 DIAGNOSIS — G2581 Restless legs syndrome: Secondary | ICD-10-CM

## 2023-05-12 MED ORDER — LEVOCETIRIZINE DIHYDROCHLORIDE 5 MG PO TABS: 5.0000 mg | ORAL_TABLET | Freq: Every evening | ORAL | Status: DC

## 2023-05-12 MED ORDER — ROPINIROLE HCL 1 MG PO TABS: 1.0000 mg | ORAL_TABLET | Freq: Every day | ORAL | 5 refills | Status: DC

## 2023-05-12 MED ORDER — CLOTRIMAZOLE 10 MG MT TROC: 10.0000 mg | Freq: Every day | OROMUCOSAL | 2 refills | Status: DC

## 2023-05-12 NOTE — Progress Notes (Signed)
Subjective:  Patient ID: Amber Stephenson, female    DOB: 02/16/1946  Age: 77 y.o. MRN: [id number]  CC: Nausea, ITCHING ALL OVER, RESTLESS LEG, and Insomnia   HPI Amber Stephenson presents for Onset yesterday of mouth breaking out. Itching all over ended yesterday or 2 days ago. Feels raw. Can't sleep last 2 nights. Restless leg aggravating her.     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), Benign paroxysmal positional vertigo due to bilateral vestibular disorder (05/20/2019), Bipolar 1 disorder (HCC) (01/23/2015), BPPV (benign paroxysmal positional vertigo) (05/20/2019), CAD (coronary artery disease), Cardiomegaly (01/12/2018), CHF (congestive heart failure) (06/08/2022), Chronic back pain (01/04/2015), Chronic constipation (04/25/2020), Chronic diastolic heart failure (HCC) (84/13/2440), Chronic pain syndrome (08/22/2019), Chronic pain syndrome (08/22/2019), Chronic post-traumatic stress disorder (PTSD) (12/06/2020), Diabetic neuropathy (HCC) (02/06/2016), Dyslipidemia (09/24/2020), Dyspnea on exertion, Essential hypertension, Family history of coronary arteriosclerosis (05/30/2015), Functional diarrhea (10/26/2020), Head trauma (09/17/2020), Hemorrhoids (03/11/2022), Herpes genitalis in women (07/16/2015), History of adenomatous polyp of colon (05/21/2019), History of radiation therapy, Insomnia (01/23/2015), Lipoma (02/08/2015), Migraine headache with aura (02/12/2016), Myofascial pain dysfunction  syndrome (08/22/2019), Myofascial pain dysfunction syndrome (08/22/2019), Non-alcoholic fatty liver disease (03/29/2535), Opioid dependence (HCC) (12/06/2020), OSA (obstructive sleep apnea) (02/24/2019), Osteopenia (12/06/2020), Pinguecula (12/06/2020), Postcoital bleeding (12/06/2020), Presbyopia (12/06/2020), Pulmonary hypertension, RLS (restless legs syndrome) (04/27/2015), Superficial fungus infection of skin (09/03/2021), Tremor, essential (12/11/2021), and Type II diabetes mellitus (HCC) (09/24/2020).   She has a past surgical history that includes THIGH SURGERY; Shoulder surgery (Right); Breast reduction surgery; Eye surgery (Right); Hammer toe surgery; LEFT HEART CATH AND CORONARY ANGIOGRAPHY (N/A, 02/03/2018); and Reverse shoulder arthroplasty (Right, 08/19/2019).   Her family history includes Alcohol abuse in her sister; Alzheimer's disease in her father; Diabetes in her brother, mother, and sister; Heart disease in her brother, mother, and sister; Mental illness in her brother; Stroke in her brother.She reports that she has never smoked. She has never used smokeless tobacco. She reports that she does not currently use alcohol. She reports that she does not use drugs.    ROS Review of Systems  Constitutional: Negative.   HENT:  Positive for mouth sores.   Eyes:  Negative for visual disturbance.  Respiratory:  Negative for shortness of breath.   Cardiovascular:  Negative for chest pain.  Gastrointestinal:  Negative for abdominal pain.  Musculoskeletal:  Positive for myalgias (legs feel funny at night.). Negative for arthralgias.  Psychiatric/Behavioral:  Positive for sleep disturbance.     Objective:  BP 100/61   Pulse 72   Temp (!) 97.4 F (36.3 C)   Ht 5\' 2"  (1.575 m)   Wt 181 lb 6.4 oz (82.3 kg)   SpO2 97%   BMI 33.18 kg/m   BP Readings from Last 3 Encounters:  05/14/23 106/66  05/12/23 100/61  05/04/23 114/85    Wt Readings from Last 3 Encounters:  05/14/23 181 lb 6.3  oz (82.3 kg)  05/12/23 181 lb 6.4 oz (82.3 kg)  05/04/23 182 lb (82.6 kg)  Physical Exam Constitutional:      General: She is not in acute distress.    Appearance: She is well-developed.  HENT:     Mouth/Throat:     Lips: Lesions (silver gray plaques on tongue) present.  Cardiovascular:     Rate and Rhythm: Normal rate and regular rhythm.  Pulmonary:     Breath sounds: Normal breath sounds.  Musculoskeletal:        General: Normal range of motion.  Skin:    General: Skin is warm and dry.  Neurological:     Mental Status: She is alert and oriented to person, place, and time.       Assessment & Plan:   Amber Stephenson was seen today for nausea, itching all over, restless leg and insomnia.  Diagnoses and all orders for this visit:  RLS (restless legs syndrome) -     rOPINIRole (REQUIP) 1 MG tablet; Take 1 tablet (1 mg total) by mouth at bedtime. For leg cramps  Primary insomnia  Oral candidiasis in newborn -     clotrimazole (MYCELEX) 10 MG troche; Take 1 tablet (10 mg total) by mouth 5 (five) times daily. For yeast in mouth and throat  Neurodermatitis -     levocetirizine (XYZAL ALLERGY 24HR) 5 MG tablet; Take 1 tablet (5 mg total) by mouth every evening. For itch  Candidiasis of mouth       I have discontinued Taziah Klosterman. Coupland's cephALEXin and fluconazole. I am also having her start on rOPINIRole, levocetirizine, and clotrimazole. Additionally, I am having her maintain her Vitamin D (Cholecalciferol), albuterol, Prolia, naloxone, ARIPiprazole, ondansetron, Dexcom G7 Sensor, insulin lispro, Insulin Pen Needle, senna-docusate, OXcarbazepine, nystatin, cyanocobalamin, apixaban, fluticasone-salmeterol, allopurinol, doxepin, metFORMIN, metoprolol tartrate, pregabalin, escitalopram, Emgality, TYLENOL, furosemide, LORazepam, pantoprazole, spironolactone, atorvastatin, potassium chloride, Omnipod 5 G7 Pods (Gen 5), Omnipod 5 G7 Intro (Gen 5), Methocarbamol, ondansetron,  promethazine, tirzepatide, hydrochlorothiazide, Lantus SoloStar, nystatin ointment, oxyCODONE, and oxyCODONE.  Allergies as of 05/12/2023       Reactions   Iodine Anaphylaxis   Ivp Dye [iodinated Contrast Media] Anaphylaxis, Swelling, Other (See Comments)   Throat closes   Latex Other (See Comments)   Latex tape pulls skin with it   Tape Other (See Comments)   Pulls off the skin, if latex   Tizanidine Other (See Comments)   Weakness, goofy, bad dreams        Medication List        Accurate as of May 12, 2023 11:59 PM. If you have any questions, ask your nurse or doctor.          STOP taking these medications    cephALEXin 500 MG capsule Commonly known as: KEFLEX Stopped by: Deagan Sevin   fluconazole 150 MG tablet Commonly known as: Diflucan Stopped by: Navraj Dreibelbis       TAKE these medications    albuterol 108 (90 Base) MCG/ACT inhaler Commonly known as: VENTOLIN HFA Inhale 2 puffs into the lungs every 6 (six) hours as needed for wheezing or shortness of breath.   allopurinol 100 MG tablet Commonly known as: ZYLOPRIM Take 100 mg by mouth daily.   apixaban 5 MG Tabs tablet Commonly known as: Eliquis Take 1 tablet (5 mg total) by mouth 2 (two) times daily.   ARIPiprazole 2 MG tablet Commonly known as: ABILIFY Take 2 mg by mouth at bedtime.   atorvastatin 80 MG tablet Commonly known as: LIPITOR Take 1 tablet (80 mg total) by mouth daily.   Belsomra 20 MG  Tabs Generic drug: Suvorexant Take 20 mg by mouth at bedtime as needed (for sleep).   clotrimazole 10 MG troche Commonly known as: MYCELEX Take 1 tablet (10 mg total) by mouth 5 (five) times daily. For yeast in mouth and throat Started by: Yosselyn Tax   cyanocobalamin 1000 MCG tablet Commonly known as: VITAMIN B12 Take 1 tablet (1,000 mcg total) by mouth daily.   Dexcom G7 Sensor Misc Change sensor every 10 days What changed:  how much to take how to take this when to take  this additional instructions   donepezil 5 MG tablet Commonly known as: ARICEPT Take 1 tablet (5 mg total) by mouth daily.   doxepin 10 MG capsule Commonly known as: SINEQUAN Take 10 mg by mouth at bedtime.   Emgality 120 MG/ML Soaj Generic drug: Galcanezumab-gnlm INJECT CONTENTS ON 1 PEN(120MG ) INTO THE SKIN ONCE MONTHLY What changed:  how much to take how to take this when to take this additional instructions   escitalopram 10 MG tablet Commonly known as: LEXAPRO Take 10 mg by mouth daily.   fluticasone-salmeterol 100-50 MCG/ACT Aepb Commonly known as: Advair Diskus Inhale 1 puff into the lungs 2 (two) times daily. What changed:  when to take this reasons to take this   furosemide 20 MG tablet Commonly known as: LASIX Take 20 mg by mouth daily.   hydrochlorothiazide 12.5 MG capsule Commonly known as: MICROZIDE Take 12.5 mg by mouth daily.   insulin lispro 100 UNIT/ML KwikPen Commonly known as: HumaLOG KwikPen Max daily 50 units What changed:  how much to take how to take this when to take this additional instructions   Insulin Pen Needle 29G X Misc 1 Device by Does not apply route daily in the afternoon.   Lantus SoloStar 100 UNIT/ML Solostar Pen Generic drug: insulin glargine Inject 35 Units into the skin 2 (two) times daily.   levocetirizine 5 MG tablet Commonly known as: Xyzal Allergy 24HR Take 1 tablet (5 mg total) by mouth every evening. For itch Started by: Suheyla Mortellaro   LORazepam 0.5 MG tablet Commonly known as: ATIVAN Take 0.5-1 mg by mouth See admin instructions. Take 0.5 mg by mouth in the morning and 1 mg at bedtime   metFORMIN 500 MG 24 hr tablet Commonly known as: GLUCOPHAGE-XR Take 2 tablets (1,000 mg total) by mouth daily with breakfast.   Methocarbamol 1000 MG Tabs Take 1,000 mg by mouth every 6 (six) hours as needed for muscle spasms.   metoprolol tartrate 50 MG tablet Commonly known as: LOPRESSOR Take 1 tablet (50 mg  total) by mouth 2 (two) times daily.   naloxone 4 MG/0.1ML Liqd nasal spray kit Commonly known as: NARCAN Place 1 spray into the nose once as needed (AS DIRECTED).   norethindrone 5 MG tablet Commonly known as: AYGESTIN Take 2 tablets (10 mg total) by mouth daily.   nystatin powder Commonly known as: MYCOSTATIN/NYSTOP Apply 1 Application topically as needed (rash).   nystatin ointment Commonly known as: MYCOSTATIN Apply 1 Application topically 2 (two) times daily as needed (for rashes).   Omnipod 5 G7 Pods (Gen 5) Misc 1 Device by Does not apply route every other day.   Omnipod 5 G7 Intro (Gen 5) Kit 1 Device by Does not apply route every other day.   ondansetron 4 MG tablet Commonly known as: Zofran Take 1 tablet (4 mg total) by mouth every 8 (eight) hours as needed for nausea or vomiting. For cancer related nausea   ondansetron  8 MG disintegrating tablet Commonly known as: ZOFRAN-ODT Take 1 tablet (8 mg total) by mouth every 6 (six) hours as needed for nausea or vomiting.   OXcarbazepine 150 MG tablet Commonly known as: TRILEPTAL Take 1 tablet (150 mg total) by mouth 2 (two) times daily.   oxyCODONE 20 mg 12 hr tablet Commonly known as: OxyCONTIN Take 1 tablet (20 mg total) by mouth every 12 (twelve) hours. To replace MS Contin- having itching as well as increased pain since cancer dx- morphine frequently not as good for cancer pain   oxyCODONE 20 mg 12 hr tablet Commonly known as: OxyCONTIN Take 1 tablet (20 mg total) by mouth every 12 (twelve) hours. 06/12/23 to be filled   pantoprazole 40 MG tablet Commonly known as: PROTONIX Take 40 mg by mouth daily as needed (for reflux).   potassium chloride 10 MEQ tablet Commonly known as: KLOR-CON Take 1 tablet (10 mEq total) by mouth daily.   pregabalin 200 MG capsule Commonly known as: LYRICA Take 200 mg by mouth 2 (two) times daily.   Prolia 60 MG/ML Sosy injection Generic drug: denosumab Inject 60 mg into the  skin every 6 (six) months.   promethazine 25 MG tablet Commonly known as: PHENERGAN Take 0.5-1 tablets (12.5-25 mg total) by mouth every 6 (six) hours as needed for refractory nausea / vomiting (cancer related nausea/vomiting).   rOPINIRole 1 MG tablet Commonly known as: REQUIP Take 1 tablet (1 mg total) by mouth at bedtime. For leg cramps Started by: Ariyonna Twichell   senna-docusate 8.6-50 MG tablet Commonly known as: Senokot-S Take 1 tablet by mouth at bedtime. What changed:  when to take this reasons to take this   spironolactone 25 MG tablet Commonly known as: ALDACTONE Take 0.5 tablets (12.5 mg total) by mouth daily.   tirzepatide 7.5 MG/0.5ML Pen Commonly known as: MOUNJARO Inject 7.5 mg into the skin once a week.   TYLENOL 500 MG tablet Generic drug: acetaminophen Take 500-1,000 mg by mouth every 6 (six) hours as needed for mild pain (pain score 1-3) or headache.   Vitamin D (Cholecalciferol) 25 MCG (1000 UT) Tabs Take 1,000 Units by mouth daily in the afternoon.         Follow-up: Return if symptoms worsen or fail to improve.  Mechele Claude, M.D.

## 2023-05-14 ENCOUNTER — Emergency Department (HOSPITAL_COMMUNITY)
Admission: EM | Admit: 2023-05-14 | Discharge: 2023-05-14 | Disposition: A | Discharge status: Home / Self Care | Payer: Medicare Other | Attending: Emergency Medicine | Admitting: Emergency Medicine

## 2023-05-14 ENCOUNTER — Emergency Department (HOSPITAL_COMMUNITY): Payer: Medicare Other

## 2023-05-14 ENCOUNTER — Other Ambulatory Visit: Payer: Self-pay

## 2023-05-14 ENCOUNTER — Encounter (HOSPITAL_COMMUNITY): Payer: Self-pay

## 2023-05-14 ENCOUNTER — Ambulatory Visit: Payer: Medicare Other

## 2023-05-14 ENCOUNTER — Ambulatory Visit: Payer: Self-pay | Admitting: Family Medicine

## 2023-05-14 DIAGNOSIS — S300XXA Contusion of lower back and pelvis, initial encounter: Secondary | ICD-10-CM | POA: Insufficient documentation

## 2023-05-14 DIAGNOSIS — S199XXA Unspecified injury of neck, initial encounter: Secondary | ICD-10-CM | POA: Diagnosis not present

## 2023-05-14 DIAGNOSIS — Z8542 Personal history of malignant neoplasm of other parts of uterus: Secondary | ICD-10-CM | POA: Diagnosis not present

## 2023-05-14 DIAGNOSIS — S20229A Contusion of unspecified back wall of thorax, initial encounter: Secondary | ICD-10-CM

## 2023-05-14 DIAGNOSIS — Z7901 Long term (current) use of anticoagulants: Secondary | ICD-10-CM | POA: Insufficient documentation

## 2023-05-14 DIAGNOSIS — R109 Unspecified abdominal pain: Secondary | ICD-10-CM | POA: Diagnosis not present

## 2023-05-14 DIAGNOSIS — R1084 Generalized abdominal pain: Secondary | ICD-10-CM

## 2023-05-14 DIAGNOSIS — W19XXXA Unspecified fall, initial encounter: Secondary | ICD-10-CM | POA: Insufficient documentation

## 2023-05-14 DIAGNOSIS — I700 Atherosclerosis of aorta: Secondary | ICD-10-CM | POA: Diagnosis not present

## 2023-05-14 DIAGNOSIS — R0689 Other abnormalities of breathing: Secondary | ICD-10-CM | POA: Diagnosis not present

## 2023-05-14 DIAGNOSIS — I2510 Atherosclerotic heart disease of native coronary artery without angina pectoris: Secondary | ICD-10-CM | POA: Insufficient documentation

## 2023-05-14 DIAGNOSIS — S301XXA Contusion of abdominal wall, initial encounter: Secondary | ICD-10-CM | POA: Insufficient documentation

## 2023-05-14 DIAGNOSIS — E782 Mixed hyperlipidemia: Secondary | ICD-10-CM

## 2023-05-14 DIAGNOSIS — R42 Dizziness and giddiness: Secondary | ICD-10-CM | POA: Diagnosis not present

## 2023-05-14 DIAGNOSIS — I3481 Nonrheumatic mitral (valve) annulus calcification: Secondary | ICD-10-CM | POA: Diagnosis not present

## 2023-05-14 DIAGNOSIS — R079 Chest pain, unspecified: Secondary | ICD-10-CM | POA: Diagnosis not present

## 2023-05-14 DIAGNOSIS — S3991XA Unspecified injury of abdomen, initial encounter: Secondary | ICD-10-CM | POA: Diagnosis present

## 2023-05-14 DIAGNOSIS — Z91040 Latex allergy status: Secondary | ICD-10-CM | POA: Diagnosis not present

## 2023-05-14 DIAGNOSIS — S0990XA Unspecified injury of head, initial encounter: Secondary | ICD-10-CM | POA: Diagnosis not present

## 2023-05-14 LAB — CBC WITH DIFFERENTIAL/PLATELET
Abs Immature Granulocytes: 0.06 10*3/uL (ref 0.00–0.07)
Basophils Absolute: 0 10*3/uL (ref 0.0–0.1)
Basophils Relative: 0 %
Eosinophils Absolute: 0.1 10*3/uL (ref 0.0–0.5)
Eosinophils Relative: 1 %
HCT: 33.2 % — ABNORMAL LOW (ref 36.0–46.0)
Hemoglobin: 11.6 g/dL — ABNORMAL LOW (ref 12.0–15.0)
Immature Granulocytes: 1 %
Lymphocytes Relative: 8 %
Lymphs Abs: 0.6 10*3/uL — ABNORMAL LOW (ref 0.7–4.0)
MCH: 30.3 pg (ref 26.0–34.0)
MCHC: 34.9 g/dL (ref 30.0–36.0)
MCV: 86.7 fL (ref 80.0–100.0)
Monocytes Absolute: 0.6 10*3/uL (ref 0.1–1.0)
Monocytes Relative: 8 %
Neutro Abs: 6.3 10*3/uL (ref 1.7–7.7)
Neutrophils Relative %: 82 %
Platelets: 187 10*3/uL (ref 150–400)
RBC: 3.83 MIL/uL — ABNORMAL LOW (ref 3.87–5.11)
RDW: 16.7 % — ABNORMAL HIGH (ref 11.5–15.5)
WBC: 7.7 10*3/uL (ref 4.0–10.5)
nRBC: 0 % (ref 0.0–0.2)

## 2023-05-14 LAB — COMPREHENSIVE METABOLIC PANEL
ALT: 15 U/L (ref 0–44)
AST: 22 U/L (ref 15–41)
Albumin: 3.5 g/dL (ref 3.5–5.0)
Alkaline Phosphatase: 73 U/L (ref 38–126)
Anion gap: 10 (ref 5–15)
BUN: 9 mg/dL (ref 8–23)
CO2: 22 mmol/L (ref 22–32)
Calcium: 9.1 mg/dL (ref 8.9–10.3)
Chloride: 95 mmol/L — ABNORMAL LOW (ref 98–111)
Creatinine, Ser: 0.66 mg/dL (ref 0.44–1.00)
GFR, Estimated: >60 mL/min (ref >60)
Glucose, Bld: 113 mg/dL — ABNORMAL HIGH (ref 70–99)
Potassium: 3.9 mmol/L (ref 3.5–5.1)
Sodium: 127 mmol/L — ABNORMAL LOW (ref 135–145)
Total Bilirubin: 0.4 mg/dL (ref <1.2)
Total Protein: 7 g/dL (ref 6.5–8.1)

## 2023-05-14 LAB — TYPE AND SCREEN
ABO/RH(D): O POS
Antibody Screen: NEGATIVE

## 2023-05-14 LAB — TROPONIN I (HIGH SENSITIVITY)
Troponin I (High Sensitivity): 9 ng/L (ref <18)
Troponin I (High Sensitivity): 9 ng/L (ref <18)

## 2023-05-14 LAB — URINALYSIS, ROUTINE W REFLEX MICROSCOPIC
Bilirubin Urine: NEGATIVE
Glucose, UA: NEGATIVE mg/dL
Hgb urine dipstick: NEGATIVE
Ketones, ur: NEGATIVE mg/dL
Leukocytes,Ua: NEGATIVE
Nitrite: NEGATIVE
Protein, ur: NEGATIVE mg/dL
Specific Gravity, Urine: 1.005 (ref 1.005–1.030)
pH: 6 (ref 5.0–8.0)

## 2023-05-14 LAB — PROTIME-INR
INR: 1 (ref 0.8–1.2)
Prothrombin Time: 13.7 s (ref 11.4–15.2)

## 2023-05-14 LAB — LIPASE, BLOOD: Lipase: 34 U/L (ref 11–51)

## 2023-05-14 LAB — LACTIC ACID, PLASMA: Lactic Acid, Venous: 1.4 mmol/L (ref 0.5–1.9)

## 2023-05-14 MED ORDER — MORPHINE SULFATE (PF) 4 MG/ML IV SOLN
4.0000 mg | Freq: Once | INTRAVENOUS | Status: AC
Start: 2023-05-14 — End: 2023-05-14
  Administered 2023-05-14: 4 mg via INTRAVENOUS
  Filled 2023-05-14: qty 1

## 2023-05-14 MED ORDER — ONDANSETRON HCL 4 MG/2ML IJ SOLN
4.0000 mg | Freq: Once | INTRAMUSCULAR | Status: AC
  Administered 2023-05-14: 4 mg via INTRAVENOUS
  Filled 2023-05-14: qty 2

## 2023-05-14 NOTE — Telephone Encounter (Signed)
SHE IS AT THE E.D. right now.

## 2023-05-14 NOTE — ED Provider Notes (Signed)
[place] EMERGENCY DEPARTMENT AT [facility] Provider Note   CSN: [id number] Arrival date & time: 05/14/23  1034     History  Chief Complaint  Patient presents with   Amber Stephenson is a 77 y.o. female.  She has a history of A-fib on anticoagulation, endometrial cancer.  She said she had a fall a few weeks ago.  Was doing okay from it but acutely had worsening pain in her chest and back, abdomen over the last few days.  She denies any more recent falls.  Has a large bruise on her left flank.  Has had some nausea and vomiting.  Has had a cough.  Feels dizzy and weak.  The history is provided by the patient and the EMS personnel.  Fall This is a new problem. The current episode started more than 1 week ago. The problem has been gradually worsening. Associated symptoms include chest pain, abdominal pain and shortness of breath. Pertinent negatives include no headaches. The symptoms are aggravated by bending and twisting. Nothing relieves the symptoms. She has tried rest for the symptoms. The treatment provided no relief.       Home Medications Prior to Admission medications   Medication Sig Start Date End Date Taking? Authorizing Provider  albuterol (VENTOLIN HFA) 108 (90 Base) MCG/ACT inhaler Inhale 2 puffs into the lungs every 6 (six) hours as needed for wheezing or shortness of breath. 08/09/20   Sharee Holster, NP  allopurinol (ZYLOPRIM) 100 MG tablet Take 100 mg by mouth daily. 10/22/22   [provider]  apixaban (ELIQUIS) 5 MG TABS tablet Take 1 tablet (5 mg total) by mouth 2 (two) times daily. 09/09/22   Quintella Reichert, MD  ARIPiprazole (ABILIFY) 2 MG tablet Take 2 mg by mouth at bedtime. 10/07/21   [provider]  atorvastatin (LIPITOR) 80 MG tablet Take 1 tablet (80 mg total) by mouth daily. 02/05/23 05/06/23  Sharlene Dory, PA-C  BELSOMRA 20 MG TABS Take 20 mg by mouth at bedtime as needed (for sleep). Patient not taking: Reported on  05/04/2023    [provider]  clotrimazole (MYCELEX) 10 MG troche Take 1 tablet (10 mg total) by mouth 5 (five) times daily. For yeast in mouth and throat 05/12/23   Mechele Claude, MD  Continuous Blood Gluc Sensor (DEXCOM G7 SENSOR) MISC Change sensor every 10 days Patient taking differently: Inject 1 Device into the skin See admin instructions. Place 1 new sensor into the skin every 10 days 05/20/22   Shamleffer, Konrad Dolores, MD  cyanocobalamin (VITAMIN B12) 1000 MCG tablet Take 1 tablet (1,000 mcg total) by mouth daily. 09/04/22   Ghimire, Werner Lean, MD  denosumab (PROLIA) 60 MG/ML SOSY injection Inject 60 mg into the skin every 6 (six) months. 08/14/20   Mechele Claude, MD  donepezil (ARICEPT) 5 MG tablet Take 1 tablet (5 mg total) by mouth daily. Patient not taking: Reported on 05/04/2023 03/03/23   Marcos Eke, PA-C  doxepin (SINEQUAN) 10 MG capsule Take 10 mg by mouth at bedtime. 11/27/22   [provider]  escitalopram (LEXAPRO) 10 MG tablet Take 10 mg by mouth daily.    [provider]  fluticasone-salmeterol (ADVAIR DISKUS) 100-50 MCG/ACT AEPB Inhale 1 puff into the lungs 2 (two) times daily. Patient taking differently: Inhale 1 puff into the lungs 2 (two) times daily as needed (for flares). 09/11/22   Parrett, Virgel Bouquet, NP  furosemide (LASIX) 20 MG tablet  Take 20 mg by mouth See admin instructions. Take 20 mg by mouth at 8 AM and 6 PM    [provider]  Galcanezumab-gnlm (EMGALITY) 120 MG/ML SOAJ INJECT CONTENTS ON 1 PEN(120MG ) INTO THE SKIN ONCE MONTHLY Patient taking differently: Inject 120 mg into the skin every 30 (thirty) days. 01/23/23   Marcos Eke, PA-C  hydrochlorothiazide (MICROZIDE) 12.5 MG capsule Take 12.5 mg by mouth daily. 02/18/23   [provider]  Insulin Disposable Pump (OMNIPOD 5 G7 INTRO, GEN 5,) KIT 1 Device by Does not apply route every other day. Patient not taking: Reported on 05/04/2023 02/12/23   Shamleffer, Konrad Dolores, MD  Insulin Disposable Pump (OMNIPOD 5 G7 PODS, GEN 5,) MISC 1 Device by Does not apply route every other day. Patient not taking: Reported on 05/04/2023 02/12/23   Shamleffer, Konrad Dolores, MD  insulin glargine (LANTUS SOLOSTAR) 100 UNIT/ML Solostar Pen Inject 35 Units into the skin 2 (two) times daily. 04/01/23   Shamleffer, Konrad Dolores, MD  insulin lispro (HUMALOG KWIKPEN) 100 UNIT/ML KwikPen Max daily 50 units Patient taking differently: Inject 4 Units into the skin See admin instructions. Inject 4 units into the skin 2 times a day with meals, per sliding scale. Add 1 additional unit for every 30 points above a BGL reading of 160. 05/28/22   Shamleffer, Konrad Dolores, MD  Insulin Pen Needle 29G X MISC 1 Device by Does not apply route daily in the afternoon. 05/28/22   Shamleffer, Konrad Dolores, MD  levocetirizine (XYZAL ALLERGY 24HR) 5 MG tablet Take 1 tablet (5 mg total) by mouth every evening. For itch 05/12/23   Mechele Claude, MD  LORazepam (ATIVAN) 0.5 MG tablet Take 0.5-1 mg by mouth See admin instructions. Take 0.5 mg by mouth in the morning and 1 mg at bedtime    [provider]  metFORMIN (GLUCOPHAGE-XR) 500 MG 24 hr tablet Take 2 tablets (1,000 mg total) by mouth daily with breakfast. 12/08/22   Shamleffer, Konrad Dolores, MD  methocarbamol 1000 MG TABS Take 1,000 mg by mouth every 6 (six) hours as needed for muscle spasms. 02/16/23   Lovorn, Aundra Millet, MD  metoprolol tartrate (LOPRESSOR) 50 MG tablet Take 1 tablet (50 mg total) by mouth 2 (two) times daily. 01/06/23   Quintella Reichert, MD  naloxone [facility]) nasal spray 4 mg/0.1 mL Place 1 spray into the nose once as needed (AS DIRECTED). 08/08/21   [provider]  norethindrone (AYGESTIN) 5 MG tablet Take 2 tablets (10 mg total) by mouth daily. 12/30/22 01/29/23  [name], DO  nystatin (MYCOSTATIN/NYSTOP) powder Apply 1 Application topically as needed (rash). 06/16/22   Shamleffer, Konrad Dolores, MD   nystatin ointment (MYCOSTATIN) Apply 1 Application topically 2 (two) times daily as needed (for rashes). 04/06/23   Adline Potter, NP  ondansetron (ZOFRAN) 4 MG tablet Take 1 tablet (4 mg total) by mouth every 8 (eight) hours as needed for nausea or vomiting. For cancer related nausea 02/16/23   Lovorn, Aundra Millet, MD  ondansetron (ZOFRAN-ODT) 8 MG disintegrating tablet Take 1 tablet (8 mg total) by mouth every 6 (six) hours as needed for nausea or vomiting. Patient not taking: Reported on 05/04/2023 01/13/22   Mechele Claude, MD  OXcarbazepine (TRILEPTAL) 150 MG tablet Take 1 tablet (150 mg total) by mouth 2 (two) times daily. 06/13/22   Marguerita Merles Latif, DO  oxyCODONE (OXYCONTIN) 20 mg 12 hr tablet Take 1 tablet (20 mg total) by mouth every 12 (twelve) hours.  To replace MS Contin- having itching as well as increased pain since cancer dx- morphine frequently not as good for cancer pain 04/16/23   Lovorn, Aundra Millet, MD  oxyCODONE (OXYCONTIN) 20 mg 12 hr tablet Take 1 tablet (20 mg total) by mouth every 12 (twelve) hours. 06/12/23 to be filled 05/04/23   Lovorn, Aundra Millet, MD  pantoprazole (PROTONIX) 40 MG tablet Take 40 mg by mouth daily as needed (for reflux). Patient not taking: Reported on 05/04/2023    [provider]  potassium chloride (KLOR-CON) 10 MEQ tablet Take 1 tablet (10 mEq total) by mouth daily. 02/05/23 05/06/23  Sharlene Dory, PA-C  pregabalin (LYRICA) 200 MG capsule Take 200 mg by mouth 2 (two) times daily.    [provider]  promethazine (PHENERGAN) 25 MG tablet Take 0.5-1 tablets (12.5-25 mg total) by mouth every 6 (six) hours as needed for refractory nausea / vomiting (cancer related nausea/vomiting). 02/16/23   Lovorn, Aundra Millet, MD  rOPINIRole (REQUIP) 1 MG tablet Take 1 tablet (1 mg total) by mouth at bedtime. For leg cramps 05/12/23   Mechele Claude, MD  senna-docusate (SENOKOT-S) 8.6-50 MG tablet Take 1 tablet by mouth at bedtime. Patient taking differently: Take 1 tablet by  mouth at bedtime as needed (for constipation). 06/13/22   Marguerita Merles Latif, DO  spironolactone (ALDACTONE) 25 MG tablet Take 0.5 tablets (12.5 mg total) by mouth daily. 01/31/23 05/01/23  Narda Bonds, MD  tirzepatide [facility]) 7.5 MG/0.5ML Pen Inject 7.5 mg into the skin once a week. 02/24/23   Shamleffer, Konrad Dolores, MD  TYLENOL 500 MG tablet Take 500-1,000 mg by mouth every 6 (six) hours as needed for mild pain or headache. Patient not taking: Reported on 05/04/2023    [provider]  Vitamin D, Cholecalciferol, 25 MCG (1000 UT) TABS Take 1,000 Units by mouth daily in the afternoon. 07/26/20   [provider]      Allergies    Iodine, Ivp dye [iodinated contrast media], Latex, Tape, and Tizanidine    Review of Systems   Review of Systems  Constitutional:  Negative for fever.  Eyes:  Negative for visual disturbance.  Respiratory:  Positive for shortness of breath.   Cardiovascular:  Positive for chest pain.  Gastrointestinal:  Positive for abdominal pain, diarrhea, nausea and vomiting.  Genitourinary:  Negative for dysuria.  Musculoskeletal:  Positive for back pain.  Neurological:  Positive for dizziness. Negative for headaches.    Physical Exam Updated Vital Signs BP 113/77 (BP Location: Left Arm)   Pulse 96   Temp (!) 97.5 F (36.4 C) (Axillary)   Resp 18   Ht 5\' 2"  (1.575 m)   Wt 82.3 kg   SpO2 98%   BMI 33.18 kg/m  Physical Exam Vitals and nursing note reviewed.  Constitutional:      General: She is not in acute distress.    Appearance: Normal appearance. She is well-developed.  HENT:     Head: Normocephalic and atraumatic.  Eyes:     Conjunctiva/sclera: Conjunctivae normal.  Cardiovascular:     Rate and Rhythm: Normal rate and regular rhythm.     Heart sounds: No murmur heard. Pulmonary:     Effort: Pulmonary effort is normal. No respiratory distress.     Breath sounds: Normal breath sounds.  Abdominal:     Palpations: Abdomen is  soft.     Tenderness: There is abdominal tenderness (generalized). There is left CVA tenderness. There is no guarding or rebound.  Comments: Is a large amount of bruising over her left flank and into her left buttock area  Musculoskeletal:        General: No deformity. Normal range of motion.     Cervical back: Neck supple.  Skin:    General: Skin is warm and dry.     Capillary Refill: Capillary refill takes less than 2 seconds.  Neurological:     General: No focal deficit present.     Mental Status: She is alert.     Motor: No weakness.     ED Results / Procedures / Treatments   Labs (all labs ordered are listed, but only abnormal results are displayed) Labs Reviewed  COMPREHENSIVE METABOLIC PANEL - Abnormal; Notable for the following components:      Result Value   Sodium 127 (*)    Chloride 95 (*)    Glucose, Bld 113 (*)    All other components within normal limits  CBC WITH DIFFERENTIAL/PLATELET - Abnormal; Notable for the following components:   RBC 3.83 (*)    Hemoglobin 11.6 (*)    HCT 33.2 (*)    RDW 16.7 (*)    Lymphs Abs 0.6 (*)    All other components within normal limits  LACTIC ACID, PLASMA  LIPASE, BLOOD  PROTIME-INR  URINALYSIS, ROUTINE W REFLEX MICROSCOPIC  TYPE AND SCREEN  TROPONIN I (HIGH SENSITIVITY)  TROPONIN I (HIGH SENSITIVITY)    EKG None  Radiology CT CHEST ABDOMEN PELVIS WO CONTRAST Result Date: 05/14/2023 CLINICAL DATA:  Left-sided pain, fell 2 weeks ago, nausea and vomiting since yesterday EXAM: CT CHEST, ABDOMEN AND PELVIS WITHOUT CONTRAST TECHNIQUE: Multidetector CT imaging of the chest, abdomen and pelvis was performed following the standard protocol without IV contrast. RADIATION DOSE REDUCTION: This exam was performed according to the departmental dose-optimization program which includes automated exposure control, adjustment of the mA and/or kV according to patient size and/or use of iterative reconstruction technique. COMPARISON:   01/13/2023 FINDINGS: CT CHEST FINDINGS Cardiovascular: Unenhanced imaging of the heart demonstrates stable cardiomegaly and dense calcification of the mitral annulus. Stable dilatation of the left atrium. Normal caliber of the thoracic aorta. Stable atherosclerosis of the aorta and coronary vasculature. Evaluation of the vascular lumen is limited without IV contrast. Mediastinum/Nodes: No enlarged mediastinal, hilar, or axillary lymph nodes. Thyroid gland, trachea, and esophagus demonstrate no significant findings. Lungs/Pleura: Mild residual reticular opacities within the right upper lobe and lingula could reflect postinflammatory scarring. The multifocal bilateral airspace disease seen previously has resolved. No effusion or pneumothorax. Central airways are patent. Musculoskeletal: No acute displaced fracture. Right shoulder arthroplasty. Reconstructed images demonstrate no additional findings. CT ABDOMEN PELVIS FINDINGS Hepatobiliary: Unremarkable unenhanced appearance of the liver and gallbladder. Pancreas: Unremarkable unenhanced appearance. Spleen: Unremarkable unenhanced appearance. Adrenals/Urinary Tract: No urinary tract calculi or obstructive uropathy within either kidney. The adrenals are stable. The bladder is unremarkable. Stomach/Bowel: No bowel obstruction or ileus. No bowel wall thickening or inflammatory change. Vascular/Lymphatic: Aortic atherosclerosis. No enlarged abdominal or pelvic lymph nodes. Reproductive: Uterus and bilateral adnexa are unremarkable. Other: No free fluid or free intraperitoneal gas. Stable fat containing right inguinal hernia. No bowel herniation. Musculoskeletal: No acute or destructive bony abnormalities. Stable spondylosis at the thoracolumbar junction. Reconstructed images demonstrate no additional findings. IMPRESSION: 1. No acute intrathoracic, intra-abdominal, or intrapelvic trauma identified on this exam limited by the lack of IV contrast. 2. Stable cardiomegaly and  dense mitral annular calcification. 3. Aortic Atherosclerosis (ICD10-I70.0). Coronary artery atherosclerosis. Electronically Signed   By:  Sharlet Salina M.D.   On: 05/14/2023 15:12   CT Head Wo Contrast Result Date: 05/14/2023 CLINICAL DATA:  Head trauma, minor (Age >= 65y); Neck trauma (Age >= 65y) EXAM: CT HEAD WITHOUT CONTRAST CT CERVICAL SPINE WITHOUT CONTRAST TECHNIQUE: Multidetector CT imaging of the head and cervical spine was performed following the standard protocol without intravenous contrast. Multiplanar CT image reconstructions of the cervical spine were also generated. RADIATION DOSE REDUCTION: This exam was performed according to the departmental dose-optimization program which includes automated exposure control, adjustment of the mA and/or kV according to patient size and/or use of iterative reconstruction technique. COMPARISON:  CT Head and C Spine 11/14/22 FINDINGS: CT HEAD FINDINGS Brain: No hemorrhage. No hydrocephalus. No extra-axial fluid collection. No CT evidence of an acute cortical infarct. No mass effect. No mass lesion. Vascular: No hyperdense vessel or unexpected calcification. Skull: Normal. Negative for fracture or focal lesion. Sinuses/Orbits: No middle ear or mastoid effusion. Paranasal sinuses are clear. Bilateral lens replacement. Orbits are otherwise unremarkable. Other: None. CT CERVICAL SPINE FINDINGS Alignment: Normal. Skull base and vertebrae: No acute fracture. No primary bone lesion or focal pathologic process. Soft tissues and spinal canal: No prevertebral fluid or swelling. No visible canal hematoma. Disc levels:  No CT evidence of high-grade spinal stenosis Upper chest: Negative. Other: None IMPRESSION: 1. No CT evidence of intracranial injury. 2. No acute fracture or traumatic subluxation of the cervical spine. Electronically Signed   By: Lorenza Cambridge M.D.   On: 05/14/2023 14:13   CT Cervical Spine Wo Contrast Result Date: 05/14/2023 CLINICAL DATA:  Head trauma,  minor (Age >= 65y); Neck trauma (Age >= 65y) EXAM: CT HEAD WITHOUT CONTRAST CT CERVICAL SPINE WITHOUT CONTRAST TECHNIQUE: Multidetector CT imaging of the head and cervical spine was performed following the standard protocol without intravenous contrast. Multiplanar CT image reconstructions of the cervical spine were also generated. RADIATION DOSE REDUCTION: This exam was performed according to the departmental dose-optimization program which includes automated exposure control, adjustment of the mA and/or kV according to patient size and/or use of iterative reconstruction technique. COMPARISON:  CT Head and C Spine 11/14/22 FINDINGS: CT HEAD FINDINGS Brain: No hemorrhage. No hydrocephalus. No extra-axial fluid collection. No CT evidence of an acute cortical infarct. No mass effect. No mass lesion. Vascular: No hyperdense vessel or unexpected calcification. Skull: Normal. Negative for fracture or focal lesion. Sinuses/Orbits: No middle ear or mastoid effusion. Paranasal sinuses are clear. Bilateral lens replacement. Orbits are otherwise unremarkable. Other: None. CT CERVICAL SPINE FINDINGS Alignment: Normal. Skull base and vertebrae: No acute fracture. No primary bone lesion or focal pathologic process. Soft tissues and spinal canal: No prevertebral fluid or swelling. No visible canal hematoma. Disc levels:  No CT evidence of high-grade spinal stenosis Upper chest: Negative. Other: None IMPRESSION: 1. No CT evidence of intracranial injury. 2. No acute fracture or traumatic subluxation of the cervical spine. Electronically Signed   By: Lorenza Cambridge M.D.   On: 05/14/2023 14:13   DG Chest Port 1 View Result Date: 05/14/2023 CLINICAL DATA:  Fall.  Left-sided pain. EXAM: PORTABLE CHEST 1 VIEW COMPARISON:  01/28/2023. FINDINGS: Redemonstration of increased interstitial markings, similar to the prior study. No frank pulmonary edema. No acute dense consolidation or lung collapse. Bilateral costophrenic angles are clear.  Stable cardio-mediastinal silhouette. No acute osseous abnormalities. The soft tissues are within normal limits. IMPRESSION: No active disease. Electronically Signed   By: Jules Schick M.D.   On: 05/14/2023 13:09    Procedures  Procedures    Medications Ordered in ED Medications  ondansetron (ZOFRAN) injection 4 mg (4 mg Intravenous Given 05/14/23 1239)  morphine (PF) 4 MG/ML injection 4 mg (4 mg Intravenous Given 05/14/23 1238)    ED Course/ Medical Decision Making/ A&P Clinical Course as of 05/14/23 2007  Thu May 14, 2023  1223 Chest x-ray interpreted by me as possible left effusion.  Awaiting radiology reading. [MB]  1526 Reviewed results of workup with patient and her husband.  She said she is comfortable with going home.  She will continue her pain meds at home and I recommended to increase her bowel regiment.  Recommended close follow-up with her treatment team. [MB]    Clinical Course User Index [MB] Terrilee Files, MD                                 Medical Decision Making Amount and/or Complexity of Data Reviewed Labs: ordered. Radiology: ordered.  Risk Prescription drug management.   This patient complains of pain in her back and her abdomen after a fall; this involves an extensive number of treatment Options and is a complaint that carries with it a high risk of complications and morbidity. The differential includes fracture, contusion, bleed, intra-abdominal injury, infection  I ordered, reviewed and interpreted labs, which included CBC with normal white count, stable low hemoglobin, chemistries with low sodium, lactate normal, troponins flat, urinalysis negative I ordered medication IV pain medicine nausea medication and reviewed PMP when indicated. I ordered imaging studies which included chest x-ray, CT head cervical spine chest abdomen and pelvis  and I independently    visualized and interpreted imaging which showed no acute traumatic findings Additional  history obtained from patient's husband Previous records obtained and reviewed in epic including recent PCP and radiation oncology notes Cardiac monitoring reviewed, sinus rhythm Social determinants considered, physically inactive Critical Interventions: None  After the interventions stated above, I reevaluated the patient and found patient to be hemodynamically stable Admission and further testing considered, no indications for admission.  I reviewed results of workup with her and she feels she can adequately control her symptoms at home and will follow-up with her treatment team.  Return instructions discussed         Final Clinical Impression(s) / ED Diagnoses Final diagnoses:  Fall, initial encounter  Generalized abdominal pain  Contusion of flank and back    Rx / DC Orders ED Discharge Orders     None         Terrilee Files, MD 05/14/23 2010

## 2023-05-14 NOTE — ED Notes (Signed)
Pt states that she is here today for lower abdominal pan for than anything else.

## 2023-05-14 NOTE — Discharge Instructions (Signed)
You were seen in the emergency department for evaluation of abdominal pain back and flank pain after a fall.  You had lab work urinalysis and a CAT scan that did not show any obvious traumatic injuries.  Please continue your regular medications and closely follow-up with your treatment team.  If you feel you are constipated you may want to increase your fluid intake and MiraLAX.  Return if any worsening or concerning symptoms

## 2023-05-14 NOTE — ED Triage Notes (Signed)
Pt BIB ems for lt side pain from a previous fall 2 weeks ago. Pt was seen previously from the fall but unsure of what results were. Pt has new onset of n/v starting yesterday. Pt denies hitting head when falling is on blood thinners.

## 2023-05-14 NOTE — Telephone Encounter (Signed)
Copied from CRM [id number]. Topic: Clinical - Red Word Triage >> May 14, 2023  9:16 AM Dennison Nancy wrote: Red Word that prompted transfer to Nurse Triage: Patient in a lot of pain preventing movement , in a lot of pain huge bruise on left side of rib towards her back . Another bruise appear connected to the bruise she already have . Was seen with Dr. Darlyn Read 2 or 3 days   Chief Complaint: Rib Pain Symptoms: Large bruise now spreading, shortness of breath, dizziness, and lightheaded Frequency: worsening past 2 days Pertinent Negatives: Patient denies new injury Disposition: [x] ED /[] Urgent Care (no appt availability in office) / [] Appointment(In office/virtual)/ []  [place] [facility]/ [] Home Care/ [] Refused Recommended Disposition /[] [place] [facility]/ []  Follow-up with PCP Additional Notes: Patient reports fall 2 weeks ago with worsening bruise over the past 2 days. Pt endorses shortness of breath and increased oxygen use. RN notes that patient sounds winded and speaking in short sentences. Pt also reports dizziness and weakness. Pt taking eliquis and just completed radiation therapy. EMS called with patient on phone. Report given to dispatcher.    Reason for Disposition  Shock suspected (e.g., cold/pale/clammy skin, too weak to stand, low BP, rapid pulse)  Answer Assessment - Initial Assessment Questions 1. APPEARANCE of BRUISE: "Describe the bruise."      Purple, red, and blue  2. SIZE: "How large is the bruise?"      10in long and 8 inches  3. NUMBER: "How many bruises are there?"          1 large bruise, but worse around edges  4. LOCATION: "Where is the bruise located?"      Mid torso on the side and raps around to your back  5. ONSET: "How long ago did the bruise occur?"  About 2-3 weeks ago after a fall  6. CAUSE: "Tell me how it happened."     [place] 2-3 weeks ago, was seen 2 days ago at PCP office  7. MEDICAL HISTORY: "Do you have any medical problems that can cause easy  bruising or bleeding?" (e.g., leukemia, liver disease, recent chemotherapy)     Takes eliquis, taking radiation therapy for cancer  8. MEDICINES: "Do you take any medications which thin the blood such as: aspirin, heparin, ibuprofen (NSAIDS), Plavix, or Coumadin?"     Eliquis  9. OTHER SYMPTOMS: "Do you have any other symptoms?"  (e.g., weakness, dizziness, pain, fever, nosebleed, blood in urine/stool)     Dizziness and weakness that started yesterday. Also reports nausea and pain  Protocols used: Bruises-A-AH

## 2023-05-14 NOTE — Telephone Encounter (Signed)
Attempted to call pt's husband cell phone as well as pt's home phone. No answer for either. Left VM on home phone informing pt we received a message about the bruising and wanted to check on her and make sure she is going to the ER since there is no documentation in her chart of her arriving at hospital yet and if she has not gone to the ER for her to please do so.

## 2023-05-15 ENCOUNTER — Encounter: Payer: Self-pay | Admitting: Family Medicine

## 2023-05-15 DIAGNOSIS — B370 Candidal stomatitis: Secondary | ICD-10-CM | POA: Insufficient documentation

## 2023-05-18 ENCOUNTER — Other Ambulatory Visit: Payer: Self-pay | Admitting: Family Medicine

## 2023-05-18 ENCOUNTER — Telehealth: Payer: Self-pay | Admitting: Family Medicine

## 2023-05-18 DIAGNOSIS — K146 Glossodynia: Secondary | ICD-10-CM

## 2023-05-18 MED ORDER — LIDOCAINE VISCOUS HCL 2 % MT SOLN: 15.0000 mL | OROMUCOSAL | 5 refills | Status: DC | PRN

## 2023-05-18 NOTE — Telephone Encounter (Signed)
Please let the patient know that I sent their prescription to their pharmacy. Thanks, WS 

## 2023-05-18 NOTE — Telephone Encounter (Signed)
Copied from CRM [id number]. Topic: Clinical - Prescription Issue >> May 18, 2023 11:16 AM Amber Stephenson wrote: Reason for CRM: Pt stated the Mycelex 10mg  isn't working, wondering if Dr. Darlyn Read can call in prescription for Lydocaine 2% oral solution to her pharmacy because it was prescribed to her before by NP Jannifer Rodney and it works better. Give pt a callback if this can be done for her

## 2023-05-18 NOTE — Telephone Encounter (Signed)
Patient aware.

## 2023-05-19 ENCOUNTER — Ambulatory Visit: Payer: Self-pay | Admitting: *Deleted

## 2023-05-19 ENCOUNTER — Ambulatory Visit: Payer: Medicare Other | Attending: Physician Assistant | Admitting: Physician Assistant

## 2023-05-19 ENCOUNTER — Other Ambulatory Visit: Payer: Self-pay | Admitting: *Deleted

## 2023-05-19 ENCOUNTER — Encounter: Payer: Self-pay | Admitting: Physician Assistant

## 2023-05-19 VITALS — BP 91/63 | HR 83 | Ht 62.0 in | Wt 180.8 lb

## 2023-05-19 DIAGNOSIS — I10 Essential (primary) hypertension: Secondary | ICD-10-CM

## 2023-05-19 DIAGNOSIS — I2510 Atherosclerotic heart disease of native coronary artery without angina pectoris: Secondary | ICD-10-CM | POA: Diagnosis not present

## 2023-05-19 DIAGNOSIS — I4821 Permanent atrial fibrillation: Secondary | ICD-10-CM

## 2023-05-19 DIAGNOSIS — E785 Hyperlipidemia, unspecified: Secondary | ICD-10-CM | POA: Diagnosis not present

## 2023-05-19 DIAGNOSIS — M79676 Pain in unspecified toe(s): Secondary | ICD-10-CM | POA: Diagnosis not present

## 2023-05-19 DIAGNOSIS — L84 Corns and callosities: Secondary | ICD-10-CM | POA: Diagnosis not present

## 2023-05-19 DIAGNOSIS — I5032 Chronic diastolic (congestive) heart failure: Secondary | ICD-10-CM

## 2023-05-19 DIAGNOSIS — B351 Tinea unguium: Secondary | ICD-10-CM | POA: Diagnosis not present

## 2023-05-19 DIAGNOSIS — E1142 Type 2 diabetes mellitus with diabetic polyneuropathy: Secondary | ICD-10-CM | POA: Diagnosis not present

## 2023-05-19 NOTE — Patient Instructions (Signed)
Medication Instructions:  Your physician has recommended you make the following change in your medication:   STOP Lisinopril  *If you need a refill on your cardiac medications before your next appointment, please call your pharmacy*   Lab Work: WHEN YOU COME BACK IN MARCH, COME FASTING FOR:  CMET, LIPID, & CBC.  GO TO THE [facility] ON THE 1ST FLOOR FOR THIS  If you have labs (blood work) drawn today and your tests are completely normal, you will receive your results only by: MyChart Message (if you have MyChart) OR A paper copy in the mail If you have any lab test that is abnormal or we need to change your treatment, we will call you to review the results.   Testing/Procedures: None ordered   Follow-Up: At [facility], you and your health needs are our priority.  As part of our continuing mission to provide you with exceptional heart care, we have created designated Provider Care Teams.  These Care Teams include your primary Cardiologist (physician) and Advanced Practice Providers (APPs -  Physician Assistants and Nurse Practitioners) who all work together to provide you with the care you need, when you need it.  We recommend signing up for the patient portal called "MyChart".  Sign up information is provided on this After Visit Summary.  MyChart is used to connect with patients for Virtual Visits (Telemedicine).  Patients are able to view lab/test results, encounter notes, upcoming appointments, etc.  Non-urgent messages can be sent to your provider as well.   To learn more about what you can do with MyChart, go to [email or web address].    Your next appointment:   3 month(s)  Provider:   Armanda Magic, MD     Other Instructions

## 2023-05-19 NOTE — Progress Notes (Signed)
Cardiology Office Note:  .   Date:  05/19/2023  ID:  Amber Stephenson, DOB 07-14-1945, MRN [id number] PCP: Amber Claude, MD   HeartCare Providers Cardiologist:  Armanda Magic, MD {  History of Present Illness: .   Amber Stephenson is a 77 y.o. female with a past medical history of permanent atrial fibrillation (on Eliquis), HFpEF, HTN, HLD, type II DM, stage II-III CKD, asthma, OSA (intolerant to CPAP), bipolar disorder and CAD (nonobstructive CAD by cath in 9/19) here for follow-up appointment.  Echocardiogram 7/22 showed LVEF 65 to 70% with no regional wall motion abnormalities.  Was hospitalized a week after a fall.  Had unsteady gait and frequent falls and was found to be hypotensive with concerns of sepsis physiology.  She developed right-sided pleuritic chest pain that was felt to be musculoskeletal.  2D echo gram showed LVEF 67 to 70% and mitral annular calcifications.  Troponin minimally elevated 38> 30> 17> 15.  Continue to have pleuritic chest pain on the right side which was much worse with deep breathing.  Chest CT did not show any rib fractures.  She has had some SOB that she thinks is because she cannot tolerate a deep breath in.  Her frequent falls were felt to be related to polypharmacy/antihypertensive/diuretic use/narcotics/benzos/psychotropic drugs.  She was last seen in April 2024 and at that time she had an ongoing complaint of right-sided pleuritic chest pain that is completely reproducible with palpation over her right chest wall and worse with movement and deep breathing.  Also makes her feel short of breath.  This was noted in the hospital as well to be musculoskeletal.  She was seen by me 02/05/2023, she tells me she was in the ER twice last month.  She was fluid overloaded and had acute pulmonary edema.  Needed IV Lasix for diuretics.  Weight at discharge was 199 pounds and this is considered her "dry weight".  She was 192 at home.  Likely a few pounds fluid  overloaded.  Having no chest pain or shortness of breath today luckily.  She is currently on Lasix 20 mg twice daily.  No chest pain.  Unfortunately, on the way to the ER she broke her left toe and is in a boot right now.  In a wheelchair.  Unable to do her usual activities.  Cannot walk 1-2 blocks and cannot pull up and down a flight of stairs.  She is able to do some simple household tasks but that is about it.  Scheduled to have GYN surgery coming up but would hold off for now if not absolutely necessary until her heart fully recovers.  Recently in the ER a few days ago for fall.  Today, she presents with a history of radiation treatments, hypertension, and atrial fibrillation with multiple complaints. She reports persistent itching and nausea since the completion of her radiation treatments. She has experienced significant weight loss and a loss of appetite. The patient also complains of abdominal pain, described as a cramping sensation in the lower abdomen, which has been associated with constipation. She has been taking Miralax powder to manage the constipation, which seems to be helping. The patient also reports having black, tarry stools, which she has not previously disclosed to a healthcare provider. She has a recent history of a fall, which resulted in a large bruise on her back. The patient's blood pressure is noted to be low, which is attributed to weight loss and possible overmedication.  Reports no shortness of  breath nor dyspnea on exertion. Reports no chest pain, pressure, or tightness. No edema, orthopnea, PND. Reports no palpitations.   Discussed the use of AI scribe software for clinical note transcription with the patient, who gave verbal consent to proceed.  ROS: Pertinent ROS in HPI  Studies Reviewed: .        Cardiac Catheterization: 01/2018 Prox LAD lesion is 40% stenosed. Mid LAD lesion is 30% stenosed. Ost 1st Mrg to 1st Mrg lesion is 30% stenosed. LV end diastolic  pressure is moderately elevated.   1. Nonobstructive CAD with left dominant circulation 2. Moderately elevated LVEDP (post hydration)   Plan: medical therapy. Will resume Eliquis this pm. Continue diuretic therapy.   No indication for antiplatelet therapy at this time.   Echocardiogram: 11/2020 IMPRESSIONS     1. Left ventricular ejection fraction, by estimation, is 65 to 70%. The  left ventricle has normal function. The left ventricle has no regional  wall motion abnormalities. There is moderate left ventricular hypertrophy.  Left ventricular diastolic  parameters are indeterminate.   2. Right ventricular systolic function is low normal. The right  ventricular size is mildly enlarged. There is normal pulmonary artery  systolic pressure.   3. Left atrial size was severely dilated.   4. Right atrial size was severely dilated.   5. The mitral valve is abnormal. Trivial mitral valve regurgitation. No  evidence of mitral stenosis. Moderate mitral annular calcification.   6. The aortic valve was not well visualized. Aortic valve regurgitation  is not visualized. No aortic stenosis is present.   7. The inferior vena cava is dilated in size with >50% respiratory  variability, suggesting right atrial pressure of 8 mmHg.        Physical Exam:   VS:  BP 91/63   Pulse 83   Ht 5\' 2"  (1.575 m)   Wt 180 lb 12.8 oz (82 kg)   SpO2 97%   BMI 33.07 kg/m    Wt Readings from Last 3 Encounters:  05/19/23 180 lb 12.8 oz (82 kg)  05/14/23 181 lb 6.3 oz (82.3 kg)  05/12/23 181 lb 6.4 oz (82.3 kg)    GEN: Well nourished, well developed in no acute distress NECK: No JVD; No carotid bruits CARDIAC: Irregularly irregular, no murmurs, rubs, gallops RESPIRATORY:  Clear to auscultation without rales, wheezing or rhonchi  ABDOMEN: Soft, non-tender, non-distended EXTREMITIES:  No edema; No deformity   ASSESSMENT AND PLAN: .    Post-radiation treatment symptoms for uterine CA Reports of itching,  nausea, weight loss, anorexia, abdominal pain, and constipation since completion of radiation treatment. Patient has been self-managing constipation with Miralax. -Continue Miralax as needed for constipation.  Hypotension Blood pressure low likely secondary to weight loss and overmedication. Patient reports feeling disoriented and fatigued. -Discontinue Lisinopril 10mg  daily. -Continue Metoprolol and monitor blood pressure at home for 1-2 weeks.  Possible Gastrointestinal Bleeding Reports of black, tarry stools suggestive of upper gastrointestinal bleeding. Hemoglobin has been low for the past few months. -tried to send a note to Mckinley Jewel, MD who she sees next week but he is out of network -Refer to GI for evaluation and possible colonoscopy. -Report black, tarry stools to oncologist at next appointment.  Atrial Fibrillation No reported symptoms. Heart rate controlled on examination. -Continue Eliquis BID. May need to hold prior to colonoscopy.  Hyperlipidemia On Lipitor 80mg  daily. -Plan to recheck lipid panel in the next couple of months.  Dispo: She can follow-up in 3 months with Dr.  Turner or APP  Signed, Sharlene Dory, PA-C

## 2023-05-19 NOTE — Patient Instructions (Addendum)
 Visit Information  Thank you for taking time to visit with me today. Please don't hesitate to contact me if I can be of assistance to you.   Following are the goals we discussed today:   Goals Addressed             This Visit's Progress    Management of Fall prevention, oncology services VBCI nurse Care Coordination   On track    Interventions Today    Flowsheet Row Most Recent Value  Chronic Disease   Chronic disease during today's visit Other  [Fall, 05/14/23 ED visit, Shortness of breath, oncology services]  General Interventions   General Interventions Discussed/Reviewed General Interventions Reviewed, Doctor Visits  Doctor Visits Discussed/Reviewed Doctor Visits Reviewed, PCP, Specialist  [followed up with Dr Sherene Sires & pc Dr Darlyn Read lately]  PCP/Specialist Visits Compliance with follow-up visit  Exercise Interventions   Exercise Discussed/Reviewed Exercise Reviewed, Physical Activity  Physical Activity Discussed/Reviewed Physical Activity Discussed  Education Interventions   Education Provided Provided Education  Provided Verbal Education On Sick Day Rules  Mental Health Interventions   Mental Health Discussed/Reviewed Mental Health Reviewed, Coping Strategies  Pharmacy Interventions   Pharmacy Dicussed/Reviewed Pharmacy Topics Reviewed, Affording Medications  Safety Interventions   Safety Discussed/Reviewed Fall Risk, Safety Reviewed, Home Safety  Home Safety Assistive Devices              Our next appointment is by telephone on 06/19/23 at 1030  Please call the care guide team at [phone] if you need to cancel or reschedule your appointment.   If you are experiencing a Mental Health or Behavioral Health Crisis or need someone to talk to, please call the Suicide and Crisis Lifeline: 988 call the [place] National Suicide Prevention Lifeline: 571-255-7794 or TTY: 3011571286 TTY 951-597-6021) to talk to a trained counselor call 1-800-273-TALK (toll free, 24 hour  hotline) call the [facility]: [phone] call 911   Patient verbalizes understanding of instructions and care plan provided today and agrees to view in MyChart. Active MyChart status and patient understanding of how to access instructions and care plan via MyChart confirmed with patient.     The patient has been provided with contact information for the care management team and has been advised to call with any health related questions or concerns.   Keymari Sato L. Noelle Penner, RN, BSN, [facility]  VBCI Care Management Coordinator  [phone]  Fax: [phone]

## 2023-05-19 NOTE — Patient Outreach (Signed)
  Care Coordination   Follow Up Visit Note   09/04/2023 updated note for 05/19/23 Name: Amber Stephenson MRN: [id number] DOB: [birth year]  Amber Stephenson is a 77 y.o. year old female who sees Stacks, Broadus John, MD for primary care. I spoke with  [name] by phone today.  What matters to the patients health and wellness today?  Fall, 05/14/23 ED visit, Shortness of breath, oncology services Fall in December feeling better but still has "a little " pain and bruises  Confirmed the 05/14/23 ED visit was related to continuing pain and bruising from her recent fall   Oncology tolerating well   Respiratory symptoms No shortness of breath symptoms voiced nor heard during outreach   Goals Addressed             This Visit's Progress    Management of Fall prevention, oncology services VBCI nurse Care Coordination   On track    Interventions Today    Flowsheet Row Most Recent Value  Chronic Disease   Chronic disease during today's visit Other  [Fall, 05/14/23 ED visit, Shortness of breath, oncology services]  General Interventions   General Interventions Discussed/Reviewed General Interventions Reviewed, Doctor Visits  Doctor Visits Discussed/Reviewed Doctor Visits Reviewed, PCP, Specialist  [followed up with Dr Sherene Sires & pc Dr Darlyn Read lately]  PCP/Specialist Visits Compliance with follow-up visit  Exercise Interventions   Exercise Discussed/Reviewed Exercise Reviewed, Physical Activity  Physical Activity Discussed/Reviewed Physical Activity Discussed  Education Interventions   Education Provided Provided Education  Provided Verbal Education On Sick Day Rules  Mental Health Interventions   Mental Health Discussed/Reviewed Mental Health Reviewed, Coping Strategies  Pharmacy Interventions   Pharmacy Dicussed/Reviewed Pharmacy Topics Reviewed, Affording Medications  Safety Interventions   Safety Discussed/Reviewed Fall Risk, Safety Reviewed, Home Safety  Home Safety Assistive  Devices              SDOH assessments and interventions completed:  No     Care Coordination Interventions:  Yes, provided   Follow up plan: Follow up call scheduled for 06/19/23    Encounter Outcome:  Patient Visit Completed    [name], RN, BSN, [facility]  VBCI Care Management Coordinator  [phone]  Fax: [phone]

## 2023-05-20 DIAGNOSIS — Z6832 Body mass index (BMI) 32.0-32.9, adult: Secondary | ICD-10-CM | POA: Diagnosis not present

## 2023-05-20 DIAGNOSIS — M79672 Pain in left foot: Secondary | ICD-10-CM | POA: Diagnosis not present

## 2023-05-20 DIAGNOSIS — E669 Obesity, unspecified: Secondary | ICD-10-CM | POA: Diagnosis not present

## 2023-05-20 DIAGNOSIS — M79671 Pain in right foot: Secondary | ICD-10-CM | POA: Diagnosis not present

## 2023-05-20 DIAGNOSIS — R768 Other specified abnormal immunological findings in serum: Secondary | ICD-10-CM | POA: Diagnosis not present

## 2023-05-20 DIAGNOSIS — G894 Chronic pain syndrome: Secondary | ICD-10-CM | POA: Diagnosis not present

## 2023-05-20 DIAGNOSIS — M109 Gout, unspecified: Secondary | ICD-10-CM | POA: Diagnosis not present

## 2023-05-22 ENCOUNTER — Encounter: Payer: Self-pay | Admitting: Internal Medicine

## 2023-05-22 ENCOUNTER — Ambulatory Visit: Payer: Medicare Other | Admitting: Internal Medicine

## 2023-05-22 VITALS — BP 128/72 | HR 88 | Resp 20 | Ht 62.0 in | Wt 179.8 lb

## 2023-05-22 DIAGNOSIS — E1165 Type 2 diabetes mellitus with hyperglycemia: Secondary | ICD-10-CM | POA: Diagnosis not present

## 2023-05-22 DIAGNOSIS — Z794 Long term (current) use of insulin: Secondary | ICD-10-CM | POA: Diagnosis not present

## 2023-05-22 LAB — POCT GLYCOSYLATED HEMOGLOBIN (HGB A1C): HbA1c, POC (prediabetic range): 6.6 % — AB (ref 5.7–6.4)

## 2023-05-22 MED ORDER — INSULIN LISPRO (1 UNIT DIAL) 100 UNIT/ML (KWIKPEN): PEN_INJECTOR | SUBCUTANEOUS | 6 refills | Status: DC

## 2023-05-22 MED ORDER — METFORMIN HCL ER 500 MG PO TB24: 1000.0000 mg | ORAL_TABLET | Freq: Every day | ORAL | 3 refills | Status: DC

## 2023-05-22 MED ORDER — INSULIN PEN NEEDLE 29G X 12MM MISC: 1.0000 | Freq: Every day | 3 refills | Status: DC

## 2023-05-22 MED ORDER — LANTUS SOLOSTAR 100 UNIT/ML ~~LOC~~ SOPN: 32.0000 [IU] | PEN_INJECTOR | Freq: Two times a day (BID) | SUBCUTANEOUS | 4 refills | Status: DC

## 2023-05-22 MED ORDER — TIRZEPATIDE 7.5 MG/0.5ML ~~LOC~~ SOAJ: 7.5000 mg | SUBCUTANEOUS | 3 refills | Status: DC

## 2023-05-22 NOTE — Progress Notes (Signed)
Name: Amber Stephenson  Age/ Sex: 77 y.o., female   MRN/ DOB: [id number], [birth year]     PCP: Amber Claude, MD   Reason for Endocrinology Evaluation: Type 2 Diabetes Mellitus  Initial Endocrine Consultative Visit: 09/24/2020    PATIENT IDENTIFIER: Amber Stephenson is a 77 y.o. female with a past medical history of T2DM, HTN, Dyslipidemia, OSA, CHF and A.Fib. The patient has followed with Endocrinology clinic since 09/24/2020 for consultative assistance with management of her diabetes.  DIABETIC HISTORY:  Amber Stephenson was diagnosed with DM in 2007, she has been on basal insulin for years, prandial insulin started 2016. GLP-1 agonists started in 2017. Metformin restarted 2017. Her hemoglobin A1c has ranged from 6.7% in 08/2019, peaking at 10.9% in 04/2020.  Saw Dr. Fransico Stephenson in 2016 and Dr. Lucianne Stephenson in 2017   On her initial visit to our clinic she had an A1c 9.1% . We increased Metformin, Rybelsus and adjusted MDI regimen.    Jardiance discontinues 10/2021 due to recurrent genital skin infections and yeast infections   Switch Rybelsus to Amber Stephenson 05/2022  I have attempted to prescribe the Amber Stephenson 02/2023 but she was undergoing radiation therapy and they opted to postpone  SUBJECTIVE:   During the last visit (02/12/2023): A1c 11.3%     Today (05/22/2023): Amber Stephenson is here for a follow up on diabetes management.  She is accompanied by her spouse today.  He injects insulin.  She checks her blood sugars multiple  times daily through the CGM. Marland Kitchen The patient has had hypoglycemic episodes since the last clinic visit. She is symptomatic with these episodes   Since her last visit here, the patient has been diagnosed with endometrial cancer, during evaluation of postmenopausal bleeding, completed radiation, has abdominal pain and constipation  Has nausea but no vomiting                        She had a follow-up with cardiology    HOME DIABETES REGIMEN:  Metformin 500 mg 2 tabs daily   Mounjaro 7.5 mg weekly Lantus 40 units BID Humalog 6 units TIDQAC CF: Humalog (BG-130/30) TIDQAC     Statin: yes ACE-I/ARB: no Prior Diabetic Education: no    CONTINUOUS GLUCOSE MONITORING RECORD INTERPRETATION    Dates of Recording: 12/7-12/20/2024  Sensor description: dexcom   Results statistics:   CGM use % of time 100  Average and SD 146/39  Time in range 80%  % Time Above 180 19  % Time above 250 1  % Time Below target 0     Glycemic patterns summary: BGs are optimal throughout the night and most of the day  Hyperglycemic episodes postprandial  Hypoglycemic episodes occurred not in the past 2 weeks  Overnight periods: Optimal      DIABETIC COMPLICATIONS: Microvascular complications:   Denies: CKD,  retinopathy , neuropathy ( she has this in the charts but denies neuropathy) Last eye exam: Completed 07/2020   Macrovascular complications:   Denies: CAD, PVD, CVA    HISTORY:  Past Medical History:  Past Medical History:  Diagnosis Date   Acquired hammer toe 12/06/2020   Acute metabolic encephalopathy 06/27/2022   Allergic rhinitis 02/24/2019   Ambulatory dysfunction 04/23/2020   Atrial fibrillation with RVR (HCC) 05/19/2017   Back pain/sacroiliitis--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   Benign paroxysmal positional vertigo due to bilateral vestibular  disorder 05/20/2019   Bipolar 1 disorder (HCC) 01/23/2015   with GAD, benzo dependence   BPPV (benign paroxysmal positional vertigo) 05/20/2019   CAD (coronary artery disease)    Nonobstructive; Managed by Dr. Purvis Stephenson   Cardiomegaly 01/12/2018   CHF (congestive heart failure) 06/08/2022   Chronic back pain 01/04/2015   Chronic constipation 04/25/2020   Chronic diastolic heart failure (HCC) 05/30/2015   Chronic pain syndrome 08/22/2019   back pain, sacroiliitis   Chronic pain syndrome  08/22/2019   Chronic post-traumatic stress disorder (PTSD) 12/06/2020   Diabetic neuropathy (HCC) 02/06/2016   Dyslipidemia 09/24/2020   Dyspnea on exertion    Essential hypertension    Family history of coronary arteriosclerosis 05/30/2015   Functional diarrhea 10/26/2020   Head trauma 09/17/2020   Hemorrhoids 03/11/2022   Herpes genitalis in women 07/16/2015   History of adenomatous polyp of colon 05/21/2019   Overview:   03/31/17: Colonoscopy: nonadvanced adenoma, microscopic colitis, f/u 5 yrs, [facility]/GAP   History of radiation therapy    Endometrial- 02/18/23-03/31/23-Amber Stephenson   Insomnia 01/23/2015   Lipoma 02/08/2015   Migraine headache with aura 02/12/2016   Myofascial pain dysfunction syndrome 08/22/2019   Myofascial pain dysfunction syndrome 08/22/2019   Non-alcoholic fatty liver disease 01/12/2018   Opioid dependence (HCC) 12/06/2020   OSA (obstructive sleep apnea) 02/24/2019   10/09/2018 - HST  - AHI 40.6    Osteopenia 12/06/2020   Rx alendronate 35 mg.   Pinguecula 12/06/2020   Postcoital bleeding 12/06/2020   Presbyopia 12/06/2020   Pulmonary hypertension    RLS (restless legs syndrome) 04/27/2015   Superficial fungus infection of skin 09/03/2021   Tremor, essential 12/11/2021   Type II diabetes mellitus (HCC) 09/24/2020   Past Surgical History:  Past Surgical History:  Procedure Laterality Date   BREAST REDUCTION SURGERY     EYE SURGERY Right    cateracts   HAMMER TOE SURGERY     LEFT HEART CATH AND CORONARY ANGIOGRAPHY N/A 02/03/2018   Procedure: LEFT HEART CATH AND CORONARY ANGIOGRAPHY;  Surgeon: [name], Peter M, MD;  Location: [facility] INVASIVE CV LAB;  Service: Cardiovascular;  Laterality: N/A;   REVERSE SHOULDER ARTHROPLASTY Right 08/19/2019   Procedure: REVERSE SHOULDER ARTHROPLASTY;  Surgeon: Amber Low, MD;  Location: [facility] ORS;  Service: Orthopedics;  Laterality: Right;  interscalene block   SHOULDER SURGERY Right    "I BROKE MY SHOUDLER   THIGH  SURGERY     "TO REMOVE A TUMOR "   Social History:  reports that she has never smoked. She has never used smokeless tobacco. She reports that she does not currently use alcohol. She reports that she does not use drugs. Family History:  Family History  Problem Relation Age of Onset   Diabetes Mother    Heart disease Mother    Alzheimer's disease Father    Heart disease Sister        CABG   Diabetes Sister    Alcohol abuse Sister    Stroke Brother    Heart disease Brother    Mental illness Brother    Diabetes Brother    Breast cancer Neg Hx    Ovarian cancer Neg Hx    Colon cancer Neg Hx    Endometrial cancer Neg Hx      HOME MEDICATIONS: Allergies as of 05/22/2023       Reactions   Iodine Anaphylaxis   Ivp Dye [iodinated Contrast Media] Anaphylaxis, Swelling, Other (See Comments)   Throat closes  Latex Other (See Comments)   Latex tape pulls skin with it   Tape Other (See Comments)   Pulls off the skin, if latex   Tizanidine Other (See Comments)   Weakness, goofy, bad dreams        Medication List        Accurate as of May 22, 2023  2:48 PM. If you have any questions, ask your nurse or doctor.          albuterol 108 (90 Base) MCG/ACT inhaler Commonly known as: VENTOLIN HFA Inhale 2 puffs into the lungs every 6 (six) hours as needed for wheezing or shortness of breath.   allopurinol 100 MG tablet Commonly known as: ZYLOPRIM Take 100 mg by mouth daily.   apixaban 5 MG Tabs tablet Commonly known as: Eliquis Take 1 tablet (5 mg total) by mouth 2 (two) times daily.   ARIPiprazole 2 MG tablet Commonly known as: ABILIFY Take 2 mg by mouth at bedtime.   atorvastatin 80 MG tablet Commonly known as: LIPITOR Take 1 tablet (80 mg total) by mouth daily.   cyanocobalamin 1000 MCG tablet Commonly known as: VITAMIN B12 Take 1 tablet (1,000 mcg total) by mouth daily.   doxepin 10 MG capsule Commonly known as: SINEQUAN Take 10 mg by mouth at bedtime.    Emgality 120 MG/ML Soaj Generic drug: Galcanezumab-gnlm INJECT CONTENTS ON 1 PEN(120MG ) INTO THE SKIN ONCE MONTHLY What changed:  how much to take how to take this when to take this additional instructions   escitalopram 10 MG tablet Commonly known as: LEXAPRO Take 10 mg by mouth daily.   fluticasone-salmeterol 100-50 MCG/ACT Aepb Commonly known as: Advair Diskus Inhale 1 puff into the lungs 2 (two) times daily. What changed:  when to take this reasons to take this   furosemide 20 MG tablet Commonly known as: LASIX Take 20 mg by mouth daily.   hydrochlorothiazide 12.5 MG capsule Commonly known as: MICROZIDE Take 12.5 mg by mouth daily.   hydrOXYzine 25 MG capsule Commonly known as: VISTARIL Take 25 mg by mouth daily at 6 (six) AM.   insulin lispro 100 UNIT/ML KwikPen Commonly known as: HumaLOG KwikPen Max daily 50 units What changed:  how much to take how to take this when to take this additional instructions   Insulin Pen Needle 29G X Misc 1 Device by Does not apply route daily in the afternoon.   Lantus SoloStar 100 UNIT/ML Solostar Pen Generic drug: insulin glargine Inject 35 Units into the skin 2 (two) times daily.   levocetirizine 5 MG tablet Commonly known as: Xyzal Allergy 24HR Take 1 tablet (5 mg total) by mouth every evening. For itch   lidocaine 2 % solution Commonly known as: XYLOCAINE Use as directed 15 mLs in the mouth or throat every 4 (four) hours as needed for mouth pain.   LORazepam 0.5 MG tablet Commonly known as: ATIVAN Take 0.5-1 mg by mouth See admin instructions. Take 0.5 mg by mouth in the morning and 1 mg at bedtime   losartan 25 MG tablet Commonly known as: COZAAR Take 25 mg by mouth daily.   metFORMIN 500 MG 24 hr tablet Commonly known as: GLUCOPHAGE-XR Take 2 tablets (1,000 mg total) by mouth daily with breakfast.   Methocarbamol 1000 MG Tabs Take 1,000 mg by mouth every 6 (six) hours as needed for muscle spasms.    metoprolol tartrate 50 MG tablet Commonly known as: LOPRESSOR Take 1 tablet (50 mg total) by mouth 2 (two)  times daily.   naloxone 4 MG/0.1ML Liqd nasal spray kit Commonly known as: NARCAN Place 1 spray into the nose once as needed (AS DIRECTED).   nystatin powder Commonly known as: MYCOSTATIN/NYSTOP Apply 1 Application topically as needed (rash).   nystatin ointment Commonly known as: MYCOSTATIN Apply 1 Application topically 2 (two) times daily as needed (for rashes).   Amber Stephenson 5 G7 Pods (Gen 5) Misc 1 Device by Does not apply route every other day.   Amber Stephenson 5 G7 Intro (Gen 5) Kit 1 Device by Does not apply route every other day.   ondansetron 4 MG tablet Commonly known as: Zofran Take 1 tablet (4 mg total) by mouth every 8 (eight) hours as needed for nausea or vomiting. For cancer related nausea   ondansetron 8 MG disintegrating tablet Commonly known as: ZOFRAN-ODT Take 1 tablet (8 mg total) by mouth every 6 (six) hours as needed for nausea or vomiting.   OXcarbazepine 150 MG tablet Commonly known as: TRILEPTAL Take 1 tablet (150 mg total) by mouth 2 (two) times daily.   oxyCODONE 20 mg 12 hr tablet Commonly known as: OxyCONTIN Take 1 tablet (20 mg total) by mouth every 12 (twelve) hours. To replace MS Contin- having itching as well as increased pain since cancer dx- morphine frequently not as good for cancer pain   Oxycodone HCl 20 MG Tabs Take 1 tablet by mouth 2 (two) times daily as needed.   oxyCODONE 20 mg 12 hr tablet Commonly known as: OxyCONTIN Take 1 tablet (20 mg total) by mouth every 12 (twelve) hours. 06/12/23 to be filled   pantoprazole 40 MG tablet Commonly known as: PROTONIX Take 40 mg by mouth daily as needed (for reflux).   potassium chloride 10 MEQ tablet Commonly known as: KLOR-CON Take 1 tablet (10 mEq total) by mouth daily.   pregabalin 200 MG capsule Commonly known as: LYRICA Take 200 mg by mouth 2 (two) times daily.   Prolia 60 MG/ML  Sosy injection Generic drug: denosumab Inject 60 mg into the skin every 6 (six) months.   promethazine 25 MG tablet Commonly known as: PHENERGAN Take 0.5-1 tablets (12.5-25 mg total) by mouth every 6 (six) hours as needed for refractory nausea / vomiting (cancer related nausea/vomiting).   rOPINIRole 1 MG tablet Commonly known as: REQUIP Take 1 tablet (1 mg total) by mouth at bedtime. For leg cramps   senna-docusate 8.6-50 MG tablet Commonly known as: Senokot-S Take 1 tablet by mouth at bedtime. What changed:  when to take this reasons to take this   spironolactone 25 MG tablet Commonly known as: ALDACTONE Take 0.5 tablets (12.5 mg total) by mouth daily.   tirzepatide 7.5 MG/0.5ML Pen Commonly known as: MOUNJARO Inject 7.5 mg into the skin once a week.   triamcinolone cream 0.1 % Commonly known as: KENALOG SMARTSIG:1 Application Topical 2-3 Times Daily   TYLENOL 500 MG tablet Generic drug: acetaminophen Take 500-1,000 mg by mouth every 6 (six) hours as needed for mild pain (pain score 1-3) or headache.   Vitamin D (Cholecalciferol) 25 MCG (1000 UT) Tabs Take 1,000 Units by mouth daily in the afternoon.         OBJECTIVE:   Vital Signs: BP 128/72 (BP Location: Right Arm, Patient Position: Sitting, Cuff Size: Normal)   Pulse 88   Resp 20   Ht 5\' 2"  (1.575 Stephenson)   Wt 179 lb 12.8 oz (81.6 kg)   SpO2 99%   BMI 32.89 kg/Stephenson   Wt Readings  from Last 3 Encounters:  05/22/23 179 lb 12.8 oz (81.6 kg)  05/19/23 180 lb 12.8 oz (82 kg)  05/14/23 181 lb 6.3 oz (82.3 kg)     Exam: General: Pt appears well and is in NAD  Lungs: Clear with good BS bilat   Heart: RRR   Extremities: No pretibial edema.   Neuro: MS is good with appropriate affect, pt is alert and Ox3    DM foot exam: 10/02/2022 per podiatry    The skin of the feet is without sores or ulcerations, left 2nd toe deformity  The pedal pulses are 1+ on right and 1+ on left. The sensation is intact to a  screening 5.07, 10 gram monofilament bilaterally   DATA REVIEWED:  Lab Results  Component Value Date   HGBA1C 11.3 (H) 01/13/2023   HGBA1C 9.8 (A) 12/08/2022   HGBA1C 6.8 (A) 05/28/2022    Latest Reference Range & Units 05/14/23 11:25  Sodium 135 - 145 mmol/L 127 (L)  Potassium 3.5 - 5.1 mmol/L 3.9  Chloride 98 - 111 mmol/L 95 (L)  CO2 22 - 32 mmol/L 22  Glucose 70 - 99 mg/dL 027 (H)  BUN 8 - 23 mg/dL 9  Creatinine 2.53 - 6.64 mg/dL 4.03  Calcium 8.9 - 47.4 mg/dL 9.1  Anion gap 5 - 15  10  Alkaline Phosphatase 38 - 126 U/L 73  Albumin 3.5 - 5.0 g/dL 3.5  Lipase 11 - 51 U/L 34  AST 15 - 41 U/L 22  ALT 0 - 44 U/L 15  Total Protein 6.5 - 8.1 g/dL 7.0  Total Bilirubin <2.5 mg/dL 0.4  GFR, Estimated >95 mL/min >60  (L): Data is abnormally Stephenson (H): Data is abnormally high  Old records , labs and images have been reviewed.   ASSESSMENT / PLAN / RECOMMENDATIONS:   1) Type 2 Diabetes Mellitus, Optimally  controlled, Without complications - Most recent A1c of 6.6 %. Goal A1c < 7.0 %.     -A1c has optimized -Patient tends to hold Lantus due to fear of hypoglycemia, I did explain to the patient the importance of taking Lantus on a regular basis but she will need to decrease the dose rather than discontinue 40 units at a time as this will result in glycemic excursions  -I have attempted to prescribe Amber Stephenson but she was undergoing radiation therapy and they opted to postpone at the time -I will decrease Lantus as below -No change to Humalog or Mounjaro at this time  MEDICATIONS:  -Continue Metformin 500 mg XR, 2 tabs daily -Decrease Lantus 32 units twice daily -Continue Mounjaro 7.5 mg weekly -Continue Humalog 6 units TIDQAC -Continue correction factor: Humalog (BG -130/25) 3 times daily before every meal   EDUCATION / INSTRUCTIONS: BG monitoring instructions: Patient is instructed to check her blood sugars 3 times a day, before meals . Call [facility] if:  BG persistently < 70 I reviewed the Rule of 15 for the treatment of hypoglycemia in detail with the patient. Literature supplied.   2) Diabetic complications:  Eye: Does not have known diabetic retinopathy.  Neuro/ Feet: Does not have known diabetic peripheral neuropathy .  Renal: Patient does not have known baseline CKD. She   is not on an ACEI/ARB at present.    F/U in 4 months     Signed electronically by: Lyndle Herrlich, MD  [facility]  [facility] [address] [place], [place] [address] Phone: [phone] FAX: [phone]  CC: Amber Claude, MD [address] [place] [address] Phone: [phone]  Fax: [phone]  Return to Endocrinology clinic as below: Future Appointments  Date Time Provider Department Center  06/15/2023  1:25 PM Amber Claude, MD [facility] None  06/19/2023 10:30 AM Clinton Gallant, RN [facility] None  07/03/2023  1:00 PM Jones Bales, NP [facility] CPR  08/27/2023  3:20 PM Quintella Reichert, MD [facility]  08/31/2023  1:00 PM Genice Rouge, MD [facility] CPR  09/02/2023 11:30 AM Marcos Eke, PA-C [facility] None  01/26/2024  8:40 AM [facility]-ANNUAL WELLNESS VISIT [facility] None

## 2023-05-22 NOTE — Patient Instructions (Signed)
-   Decrease Lantus 32 units twice daily  - Continue Mounjaro 7.5 mg weekly  - Continue  Humalog 6  units with each meal  -Humalog correctional insulin: ADD extra units on insulin to your meal-time Humalog  dose if your blood sugars are higher than 160. Use the scale below to help guide you before each meal   Blood sugar before meal Number of units to inject  Less than 155 0 unit  156 - 180 1 units  181 - 205 2 units  206 - 230 3 units  231 - 255 4 units  256 - 280 5 units  281 - 305 6 units  306 - 330 7 units  331 - 355 8 units  356 - 380 9 units   381 - 405 10 units        HOW TO TREAT LOW BLOOD SUGARS (Blood sugar LESS THAN 70 MG/DL) Please follow the RULE OF 15 for the treatment of hypoglycemia treatment (when your (blood sugars are less than 70 mg/dL)   STEP 1: Take 15 grams of carbohydrates when your blood sugar is low, which includes:  3-4 GLUCOSE TABS  OR 3-4 OZ OF JUICE OR REGULAR SODA OR ONE TUBE OF GLUCOSE GEL    STEP 2: RECHECK blood sugar in 15 MINUTES STEP 3: If your blood sugar is still low at the 15 minute recheck --> then, go back to STEP 1 and treat AGAIN with another 15 grams of carbohydrates.

## 2023-05-25 ENCOUNTER — Encounter: Payer: Self-pay | Admitting: Family Medicine

## 2023-05-25 ENCOUNTER — Ambulatory Visit (INDEPENDENT_AMBULATORY_CARE_PROVIDER_SITE_OTHER): Payer: Medicare Other | Admitting: Family Medicine

## 2023-05-25 ENCOUNTER — Telehealth: Payer: Self-pay

## 2023-05-25 VITALS — BP 134/80 | HR 81 | Temp 98.3°F | Ht 62.0 in | Wt 180.2 lb

## 2023-05-25 DIAGNOSIS — I4811 Longstanding persistent atrial fibrillation: Secondary | ICD-10-CM | POA: Diagnosis not present

## 2023-05-25 DIAGNOSIS — C541 Malignant neoplasm of endometrium: Secondary | ICD-10-CM | POA: Diagnosis not present

## 2023-05-25 DIAGNOSIS — R21 Rash and other nonspecific skin eruption: Secondary | ICD-10-CM

## 2023-05-25 DIAGNOSIS — M810 Age-related osteoporosis without current pathological fracture: Secondary | ICD-10-CM

## 2023-05-25 DIAGNOSIS — K921 Melena: Secondary | ICD-10-CM | POA: Diagnosis not present

## 2023-05-25 DIAGNOSIS — D649 Anemia, unspecified: Secondary | ICD-10-CM | POA: Diagnosis not present

## 2023-05-25 MED ORDER — TRIAMCINOLONE ACETONIDE 0.1 % EX CREA
TOPICAL_CREAM | Freq: Two times a day (BID) | CUTANEOUS | 0 refills | Status: AC
Start: 2023-05-25 — End: 2023-06-04

## 2023-05-25 MED ORDER — DENOSUMAB 60 MG/ML ~~LOC~~ SOSY
60.0000 mg | PREFILLED_SYRINGE | Freq: Once | SUBCUTANEOUS | Status: AC
  Administered 2023-07-13: 60 mg via SUBCUTANEOUS

## 2023-05-25 NOTE — Telephone Encounter (Signed)
 Prolia ordered for PA team to review verification benefits

## 2023-05-25 NOTE — Progress Notes (Signed)
   Acute Office Visit  Subjective:     Patient ID: Amber Stephenson, female    DOB: 11-02-1945, 77 y.o.   MRN: [id number]  Chief Complaint  Patient presents with   Rash    Rash This is a new problem. Episode onset: 2 weeks. The problem has been gradually worsening since onset. The affected locations include the abdomen. The rash is characterized by redness and itchiness. She was exposed to nothing. Pertinent negatives include no fever. Treatments tried: tigerbalm, xyzal. The treatment provided no relief.    Review of Systems  Constitutional:  Negative for fever.  Skin:  Positive for rash.        Objective:    BP 134/80   Pulse 81   Temp 98.3 F (36.8 C) (Temporal)   Ht 5\' 2"  (1.575 m)   Wt 180 lb 3.2 oz (81.7 kg)   SpO2 96%   BMI 32.96 kg/m    Physical Exam Vitals and nursing note reviewed.  Constitutional:      General: She is not in acute distress.    Appearance: She is obese. She is not ill-appearing, toxic-appearing or diaphoretic.  Pulmonary:     Effort: Pulmonary effort is normal. No respiratory distress.  Skin:    General: Skin is warm and dry.     Findings: Rash (scattered dry scales to lower abdomen) present.  Neurological:     General: No focal deficit present.     Mental Status: She is alert and oriented to person, place, and time.  Psychiatric:        Mood and Affect: Mood normal.        Behavior: Behavior normal.     No results found for any visits on 05/25/23.      Assessment & Plan:   Amber Stephenson was seen today for rash.  Diagnoses and all orders for this visit:  Rash Dry, scales. Kenalog BID as below. Discussed emollients.  -     triamcinolone cream (KENALOG) 0.1 %; Apply topically 2 (two) times daily for 10 days.   Return if symptoms worsen or fail to improve.  Gabriel Earing, FNP

## 2023-06-02 ENCOUNTER — Telehealth: Payer: Self-pay

## 2023-06-02 NOTE — Telephone Encounter (Signed)
 Copied from CRM [id number]. Topic: Clinical - Medical Advice >> Jun 02, 2023  8:16 AM Amber Stephenson wrote: Reason for CRM: Pt stated that she is not taking her furosemide  (LASIX ) 20 MG tablet because she felt there was no need to and now she is wheezing. Cb# [phone] Please contact patient to discuss what she needs to do

## 2023-06-02 NOTE — Telephone Encounter (Signed)
Pt says she decided to start taking her lasix again as of this morning but is wheezing loudly. Pt was advised to seek medical attention at Urgent Care through [facility] in [place]. Pt voiced understanding.

## 2023-06-03 ENCOUNTER — Ambulatory Visit (INDEPENDENT_AMBULATORY_CARE_PROVIDER_SITE_OTHER): Payer: Medicare Other

## 2023-06-03 ENCOUNTER — Ambulatory Visit
Admission: RE | Admit: 2023-06-03 | Discharge: 2023-06-03 | Disposition: A | Discharge status: Home / Self Care | Payer: Medicare Other | Source: Ambulatory Visit | Attending: Family Medicine

## 2023-06-03 VITALS — BP 120/81 | HR 93 | Temp 97.7°F | Resp 17

## 2023-06-03 DIAGNOSIS — J22 Unspecified acute lower respiratory infection: Secondary | ICD-10-CM

## 2023-06-03 DIAGNOSIS — R051 Acute cough: Secondary | ICD-10-CM

## 2023-06-03 DIAGNOSIS — Z96611 Presence of right artificial shoulder joint: Secondary | ICD-10-CM | POA: Diagnosis not present

## 2023-06-03 DIAGNOSIS — R058 Other specified cough: Secondary | ICD-10-CM | POA: Diagnosis not present

## 2023-06-03 DIAGNOSIS — J4541 Moderate persistent asthma with (acute) exacerbation: Secondary | ICD-10-CM | POA: Diagnosis not present

## 2023-06-03 DIAGNOSIS — I3481 Nonrheumatic mitral (valve) annulus calcification: Secondary | ICD-10-CM | POA: Diagnosis not present

## 2023-06-03 DIAGNOSIS — R918 Other nonspecific abnormal finding of lung field: Secondary | ICD-10-CM | POA: Diagnosis not present

## 2023-06-03 MED ORDER — AMOXICILLIN-POT CLAVULANATE 875-125 MG PO TABS: 1.0000 | ORAL_TABLET | Freq: Two times a day (BID) | ORAL | 0 refills | Status: DC

## 2023-06-03 NOTE — Discharge Instructions (Signed)
 I will call if your radiology report from your chest x-ray shows anything that we have not already discussed.  I have sent in an antibiotic for a possible lower respiratory infection and you may take medications such as Coricidin HBP, plain Mucinex , Flonase  nasal spray, saline sinus rinses and use humidifiers and vapor rubs.  Follow-up for worsening symptoms at any time.

## 2023-06-03 NOTE — ED Triage Notes (Signed)
 Pt reports cough, wheezing x 3 days.

## 2023-06-04 ENCOUNTER — Encounter: Payer: Self-pay | Admitting: Internal Medicine

## 2023-06-05 ENCOUNTER — Ambulatory Visit (INDEPENDENT_AMBULATORY_CARE_PROVIDER_SITE_OTHER): Payer: Medicare Other | Admitting: Family Medicine

## 2023-06-05 ENCOUNTER — Encounter: Payer: Self-pay | Admitting: Family Medicine

## 2023-06-05 VITALS — BP 111/73 | HR 93 | Ht 62.0 in | Wt 181.0 lb

## 2023-06-05 DIAGNOSIS — J189 Pneumonia, unspecified organism: Secondary | ICD-10-CM | POA: Diagnosis not present

## 2023-06-05 MED ORDER — MECLIZINE HCL 25 MG PO TABS: 25.0000 mg | ORAL_TABLET | Freq: Three times a day (TID) | ORAL | 1 refills | Status: DC | PRN

## 2023-06-05 MED ORDER — AMOXICILLIN-POT CLAVULANATE 875-125 MG PO TABS: 1.0000 | ORAL_TABLET | Freq: Two times a day (BID) | ORAL | 0 refills | Status: DC

## 2023-06-05 NOTE — Patient Instructions (Signed)
 Continue antibiotic, sent a refill for her to take at least 12 days of it.  Recommend for her to use the albuterol inhaler every 6-8 hours until she gets over this and use the Advair 1 puff twice a day

## 2023-06-05 NOTE — Progress Notes (Signed)
 BP 111/73   Pulse 93   Ht 5' 2 (1.575 m)   Wt 181 lb (82.1 kg)   SpO2 96%   BMI 33.11 kg/m    Subjective:   Patient ID: Amber Stephenson, female    DOB: 08-14-1945, 78 y.o.   MRN: [id number]  HPI: Amber Stephenson is a 78 y.o. female presenting on 06/05/2023 for Follow-up (Pneumonia)   HPI Pneumonia follow-up Patient is coming in for pneumonia follow-up.  She was seen in the urgent care couple days ago and has just over 24 hours of the antibiotic in her system.  She is still having a lot of coughing and congestion.  She is also taking Coricidin to try and help.  She is getting some dizziness with her sinus pressure and wants her meclizine  back.  She does have inhalers but is not currently using them and will recommend that she go ahead and get back on both the Advair and the albuterol .  She is still feeling a little short of breath and has oxygen  at home that she is using sometimes.  Her oxygen  saturation here in the office today is 96%.  Her cough is very productive.  Relevant past medical, surgical, family and social history reviewed and updated as indicated. Interim medical history since our last visit reviewed. Allergies and medications reviewed and updated.  Review of Systems  Constitutional:  Negative for chills and fever.  HENT:  Positive for congestion, postnasal drip, rhinorrhea and sinus pressure. Negative for ear discharge, ear pain, sneezing and sore throat.   Eyes:  Negative for pain, redness and visual disturbance.  Respiratory:  Positive for cough, shortness of breath and wheezing. Negative for chest tightness.   Cardiovascular:  Negative for chest pain and leg swelling.  Genitourinary:  Negative for difficulty urinating and dysuria.  Musculoskeletal:  Negative for back pain and gait problem.  Skin:  Negative for rash.  Neurological:  Negative for light-headedness and headaches.  Psychiatric/Behavioral:  Negative for agitation and behavioral problems.   All other  systems reviewed and are negative.   Per HPI unless specifically indicated above   Allergies as of 06/05/2023       Reactions   Iodine Anaphylaxis   Ivp Dye [iodinated Contrast Media] Anaphylaxis, Swelling, Other (See Comments)   Throat closes   Latex Other (See Comments)   Latex tape pulls skin with it   Tape Other (See Comments)   Pulls off the skin, if latex   Tizanidine  Other (See Comments)   Weakness, goofy, bad dreams        Medication List        Accurate as of June 05, 2023  4:35 PM. If you have any questions, ask your nurse or doctor.          albuterol  108 (90 Base) MCG/ACT inhaler Commonly known as: VENTOLIN  HFA Inhale 2 puffs into the lungs every 6 (six) hours as needed for wheezing or shortness of breath.   allopurinol  100 MG tablet Commonly known as: ZYLOPRIM  Take 100 mg by mouth daily.   amoxicillin -clavulanate 875-125 MG tablet Commonly known as: AUGMENTIN  Take 1 tablet by mouth every 12 (twelve) hours.   apixaban  5 MG Tabs tablet Commonly known as: Eliquis  Take 1 tablet (5 mg total) by mouth 2 (two) times daily.   ARIPiprazole  2 MG tablet Commonly known as: ABILIFY  Take 2 mg by mouth at bedtime.   atorvastatin  80 MG tablet Commonly known as: LIPITOR Take 1 tablet (80  mg total) by mouth daily.   cyanocobalamin  1000 MCG tablet Commonly known as: VITAMIN B12 Take 1 tablet (1,000 mcg total) by mouth daily.   doxepin  10 MG capsule Commonly known as: SINEQUAN  Take 10 mg by mouth at bedtime.   Emgality  120 MG/ML Soaj Generic drug: Galcanezumab -gnlm INJECT CONTENTS ON 1 PEN(120MG ) INTO THE SKIN ONCE MONTHLY What changed:  how much to take how to take this when to take this additional instructions   escitalopram  10 MG tablet Commonly known as: LEXAPRO  Take 10 mg by mouth daily.   fluticasone -salmeterol 100-50 MCG/ACT Aepb Commonly known as: Advair Diskus Inhale 1 puff into the lungs 2 (two) times daily. What changed:  when to  take this reasons to take this   furosemide  20 MG tablet Commonly known as: LASIX  Take 20 mg by mouth daily.   hydrochlorothiazide  12.5 MG capsule Commonly known as: MICROZIDE  Take 12.5 mg by mouth daily.   hydrOXYzine  25 MG capsule Commonly known as: VISTARIL  Take 25 mg by mouth daily at 6 (six) AM.   insulin  lispro 100 UNIT/ML KwikPen Commonly known as: HumaLOG  KwikPen Max daily 45 units   Insulin  Pen Needle 29G X Misc 1 Device by Does not apply route daily in the afternoon.   Lantus  SoloStar 100 UNIT/ML Solostar Pen Generic drug: insulin  glargine Inject 32 Units into the skin 2 (two) times daily.   levocetirizine 5 MG tablet Commonly known as: Xyzal  Allergy 24HR Take 1 tablet (5 mg total) by mouth every evening. For itch   lidocaine  2 % solution Commonly known as: XYLOCAINE  Use as directed 15 mLs in the mouth or throat every 4 (four) hours as needed for mouth pain.   LORazepam  0.5 MG tablet Commonly known as: ATIVAN  Take 0.5-1 mg by mouth See admin instructions. Take 0.5 mg by mouth in the morning and 1 mg at bedtime   losartan  25 MG tablet Commonly known as: COZAAR  Take 25 mg by mouth daily.   meclizine  25 MG tablet Commonly known as: ANTIVERT  Take 1 tablet (25 mg total) by mouth 3 (three) times daily as needed for dizziness. Started by: [facility] Bhavik Cabiness   metFORMIN  500 MG 24 hr tablet Commonly known as: GLUCOPHAGE -XR Take 2 tablets (1,000 mg total) by mouth daily with breakfast.   Methocarbamol  1000 MG Tabs Take 1,000 mg by mouth every 6 (six) hours as needed for muscle spasms.   metoprolol  tartrate 50 MG tablet Commonly known as: LOPRESSOR  Take 1 tablet (50 mg total) by mouth 2 (two) times daily.   naloxone  4 MG/0.1ML Liqd nasal spray kit Commonly known as: NARCAN  Place 1 spray into the nose once as needed (AS DIRECTED).   nystatin  powder Commonly known as: MYCOSTATIN /NYSTOP  Apply 1 Application topically as needed (rash).   nystatin   ointment Commonly known as: MYCOSTATIN  Apply 1 Application topically 2 (two) times daily as needed (for rashes).   Omnipod 5 G7 Pods (Gen 5) Misc 1 Device by Does not apply route every other day.   Omnipod 5 G7 Intro (Gen 5) Kit 1 Device by Does not apply route every other day.   ondansetron  4 MG tablet Commonly known as: Zofran  Take 1 tablet (4 mg total) by mouth every 8 (eight) hours as needed for nausea or vomiting. For cancer related nausea   ondansetron  8 MG disintegrating tablet Commonly known as: ZOFRAN -ODT Take 1 tablet (8 mg total) by mouth every 6 (six) hours as needed for nausea or vomiting.   OXcarbazepine  150 MG tablet Commonly  known as: TRILEPTAL  Take 1 tablet (150 mg total) by mouth 2 (two) times daily.   oxyCODONE  20 mg 12 hr tablet Commonly known as: OxyCONTIN  Take 1 tablet (20 mg total) by mouth every 12 (twelve) hours. To replace MS Contin - having itching as well as increased pain since cancer dx- morphine  frequently not as good for cancer pain   Oxycodone  HCl 20 MG Tabs Take 1 tablet by mouth 2 (two) times daily as needed.   oxyCODONE  20 mg 12 hr tablet Commonly known as: OxyCONTIN  Take 1 tablet (20 mg total) by mouth every 12 (twelve) hours. 06/12/23 to be filled   pantoprazole  40 MG tablet Commonly known as: PROTONIX  Take 40 mg by mouth daily as needed (for reflux).   potassium chloride  10 MEQ tablet Commonly known as: KLOR-CON  Take 1 tablet (10 mEq total) by mouth daily.   pregabalin  200 MG capsule Commonly known as: LYRICA  Take 200 mg by mouth 2 (two) times daily.   Prolia  60 MG/ML Sosy injection Generic drug: denosumab  Inject 60 mg into the skin every 6 (six) months.   promethazine  25 MG tablet Commonly known as: PHENERGAN  Take 0.5-1 tablets (12.5-25 mg total) by mouth every 6 (six) hours as needed for refractory nausea / vomiting (cancer related nausea/vomiting).   rOPINIRole  1 MG tablet Commonly known as: REQUIP  Take 1 tablet (1 mg  total) by mouth at bedtime. For leg cramps   senna-docusate 8.6-50 MG tablet Commonly known as: Senokot-S Take 1 tablet by mouth at bedtime. What changed:  when to take this reasons to take this   spironolactone  25 MG tablet Commonly known as: ALDACTONE  Take 0.5 tablets (12.5 mg total) by mouth daily.   tirzepatide  7.5 MG/0.5ML Pen Commonly known as: MOUNJARO  Inject 7.5 mg into the skin once a week.   TYLENOL  500 MG tablet Generic drug: acetaminophen  Take 500-1,000 mg by mouth every 6 (six) hours as needed for mild pain (pain score 1-3) or headache.   Vitamin D  (Cholecalciferol ) 25 MCG (1000 UT) Tabs Take 1,000 Units by mouth daily in the afternoon.         Objective:   BP 111/73   Pulse 93   Ht 5' 2 (1.575 m)   Wt 181 lb (82.1 kg)   SpO2 96%   BMI 33.11 kg/m   Wt Readings from Last 3 Encounters:  06/05/23 181 lb (82.1 kg)  05/25/23 180 lb 3.2 oz (81.7 kg)  05/22/23 179 lb 12.8 oz (81.6 kg)    Physical Exam Vitals and nursing note reviewed.  Constitutional:      General: She is not in acute distress.    Appearance: She is well-developed. She is not diaphoretic.  HENT:     Mouth/Throat:     Mouth: Mucous membranes are moist.     Pharynx: Oropharynx is clear. No oropharyngeal exudate or posterior oropharyngeal erythema.  Eyes:     Conjunctiva/sclera: Conjunctivae normal.  Cardiovascular:     Rate and Rhythm: Normal rate and regular rhythm.     Heart sounds: Normal heart sounds. No murmur heard. Pulmonary:     Effort: Pulmonary effort is normal. No respiratory distress.     Breath sounds: Rhonchi and rales (Right lower and right mid) present. No wheezing.  Musculoskeletal:        General: No tenderness. Normal range of motion.  Skin:    General: Skin is warm and dry.     Findings: No rash.  Neurological:     Mental Status: She is  alert and oriented to person, place, and time.     Coordination: Coordination normal.  Psychiatric:        Behavior:  Behavior normal.       Assessment & Plan:   Problem List Items Addressed This Visit   None Visit Diagnoses       Pneumonia of right upper lobe due to infectious organism    -  Primary   Relevant Medications   amoxicillin -clavulanate (AUGMENTIN ) 875-125 MG tablet   meclizine  (ANTIVERT ) 25 MG tablet   Other Relevant Orders   CBC with Differential/Platelet   CMP14+EGFR     Still sounds pretty rough and congestion.  Will extend the course of the antibiotics that she takes a full 2 weeks.  Sent meclizine  that she can help with dizziness.  Continue with Coricidin.  Recommended that she start using her inhalers including albuterol  and Advair consistently around-the-clock to improve this.  Call back if worsens or does not improve.  Use oxygen  as needed at home.  Follow up plan: Return if symptoms worsen or fail to improve, for Has follow-up with PCP in 2 weeks, keep that.  Counseling provided for all of the vaccine components Orders Placed This Encounter  Procedures   CBC with Differential/Platelet   CMP14+EGFR    Fonda Levins, MD [facility] 06/05/2023, 4:35 PM

## 2023-06-07 NOTE — ED Provider Notes (Signed)
 [facility]    CSN: [id number] Arrival date & time: 06/03/23  1349      History   Chief Complaint No chief complaint on file.   HPI Amber Stephenson is a 78 y.o. female.   Presenting today with 3 day history of cough, wheezing, SOB. Denies fever, chills, CP, abdominal pain, N/V/D. Hx of atrial fibrillation, CHF, CAD, asthma, insulin  depending diabetes. Currently on advair and albuterol  and complaint with chronic medications.     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   Benign paroxysmal positional vertigo due to bilateral vestibular disorder 05/20/2019   Bipolar 1 disorder (HCC) 01/23/2015   with GAD, benzo dependence   BPPV (benign paroxysmal positional vertigo) 05/20/2019   CAD (coronary artery disease)    Nonobstructive; Managed by Dr. Charls   Cardiomegaly 01/12/2018   CHF (congestive heart failure) 06/08/2022   Chronic back pain 01/04/2015   Chronic constipation 04/25/2020   Chronic diastolic heart failure (HCC) 05/30/2015   Chronic pain syndrome 08/22/2019   back pain, sacroiliitis   Chronic pain syndrome 08/22/2019   Chronic post-traumatic stress disorder (PTSD) 12/06/2020   Diabetic neuropathy (HCC) 02/06/2016   Dyslipidemia 09/24/2020   Dyspnea on exertion    Essential hypertension    Family history of coronary arteriosclerosis 05/30/2015   Functional diarrhea 10/26/2020   Head trauma 09/17/2020   Hemorrhoids 03/11/2022   Herpes genitalis in women 07/16/2015   History of adenomatous polyp of colon 05/21/2019   Overview:   03/31/17: Colonoscopy: nonadvanced adenoma, microscopic colitis, f/u 5  yrs, [facility]/GAP   History of radiation therapy    Endometrial- 02/18/23-03/31/23-Dr. Lynwood Nasuti   Insomnia 01/23/2015   Lipoma 02/08/2015   Migraine headache with aura 02/12/2016   Myofascial pain dysfunction syndrome 08/22/2019   Myofascial pain dysfunction syndrome 08/22/2019   Non-alcoholic fatty liver disease 01/12/2018   Opioid dependence (HCC) 12/06/2020   OSA (obstructive sleep apnea) 02/24/2019   10/09/2018 - HST  - AHI 40.6    Osteopenia 12/06/2020   Rx alendronate  35 mg.   Pinguecula 12/06/2020   Postcoital bleeding 12/06/2020   Presbyopia 12/06/2020   Pulmonary hypertension    RLS (restless legs syndrome) 04/27/2015   Superficial fungus infection of skin 09/03/2021   Tremor, essential 12/11/2021   Type II diabetes mellitus (HCC) 09/24/2020    Patient Active Problem List   Diagnosis Date Noted   Candidiasis of mouth 05/15/2023   Nausea and vomiting 02/16/2023   Type 2 diabetes mellitus with hyperglycemia, with long-term current use of insulin  (HCC) 02/12/2023   Long term (current) use of oral hypoglycemic drugs 02/12/2023   Acute heart failure (HCC) 01/28/2023   Pure hypercholesterolemia 01/14/2023   Primary hypertension 01/14/2023   Metabolic acidosis 01/12/2023   Endometrial cancer (HCC) 01/12/2023   Acute on chronic respiratory failure with hypoxia (HCC) 01/12/2023   Vulvar itching 12/22/2022   PMB (postmenopausal bleeding) 11/13/2022   Superficial fungus infection of skin 11/13/2022   Burning with urination 11/13/2022   UTI (urinary tract infection) 11/09/2022   Vitamin B12 deficiency 11/09/2022   Scalp hematoma, initial encounter 11/09/2022   Frequent falls 09/01/2022   Hypotension 09/01/2022   Renal insufficiency 08/08/2022  AKI (acute kidney injury) (HCC) 06/27/2022   Hemorrhoids 03/11/2022   Rectal itching 03/11/2022   Microalbuminuria 01/21/2022   Tremor, essential 12/11/2021   Chronic bilateral low back pain with right-sided sciatica 07/19/2021    Vaginal itching 04/16/2021   Sensory ataxia 03/28/2021   Acute diastolic CHF (congestive heart failure) (HCC) 12/16/2020   Elevated troponin 12/14/2020   Chronic post-traumatic stress disorder (PTSD) 12/06/2020   Senile nuclear sclerosis 12/06/2020   Senile corneal changes 12/06/2020   Postmenopausal bleeding 12/06/2020   Acquired hammer toe 12/06/2020   Opioid dependence 12/06/2020   Hypermetropia 12/06/2020   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   Generalized anxiety disorder 12/06/2020   Functional diarrhea 10/26/2020   Insulin  dependent type 2 diabetes mellitus (HCC) 09/24/2020   Chronic respiratory failure with hypoxia (HCC) 04/25/2020   Chronic constipation 04/25/2020   CAD (coronary artery disease)    Hypocalcemia    S/P shoulder replacement, 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   Mixed hyperlipidemia 05/20/2019   Vertigo 04/25/2019   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   OSA (obstructive sleep apnea) 02/24/2019   Acute on chronic hypoxic respiratory failure (HCC) 01/22/2019   Acute pulmonary edema (HCC) 01/21/2019   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   At risk for adverse drug event 07/06/2018   Non-alcoholic fatty liver disease 01/12/2018   Mild intermittent asthma 01/12/2018   Senile osteoporosis 12/15/2017   Atrial fibrillation with rapid ventricular response (HCC) 05/19/2017   Migraine headache with aura 02/12/2016   Diabetic neuropathy (HCC) 02/06/2016   Bipolar 1 disorder (HCC) 10/04/2015   Major depressive disorder 10/03/2015   Herpes genitalis in women 07/16/2015   Long term current use of anticoagulant 05/30/2015   Chronic diastolic heart failure (HCC) 05/30/2015   Essential hypertension    RLS (restless legs syndrome) 04/27/2015   Insomnia 01/23/2015   Chronic atrial fibrillation with RVR (HCC) 01/04/2015    Past Surgical History:  Procedure Laterality Date   BREAST REDUCTION SURGERY     EYE SURGERY Right    cateracts   HAMMER TOE SURGERY     LEFT  HEART CATH AND CORONARY ANGIOGRAPHY N/A 02/03/2018   Procedure: LEFT HEART CATH AND CORONARY ANGIOGRAPHY;  Surgeon: Jordan, Peter M, MD;  Location: [facility] INVASIVE CV LAB;  Service: Cardiovascular;  Laterality: N/A;   REVERSE SHOULDER ARTHROPLASTY Right 08/19/2019   Procedure: REVERSE SHOULDER ARTHROPLASTY;  Surgeon: Kay Kemps, MD;  Location: [facility];  Service: Orthopedics;  Laterality: Right;  interscalene block   SHOULDER SURGERY Right    I BROKE MY SHOUDLER   THIGH SURGERY     TO REMOVE A TUMOR     OB History     Gravida  3   Para  3   Term  3   Preterm      AB      Living  3      SAB      IAB      Ectopic      Multiple      Live Births  3            Home Medications    Prior to Admission medications   Medication Sig Start Date End Date Taking? Authorizing Provider  albuterol  (VENTOLIN  HFA) 108 (90 Base) MCG/ACT inhaler Inhale 2 puffs into the lungs every 6 (six) hours as needed for wheezing or shortness of breath. 08/09/20   Landy Barnie RAMAN, NP  allopurinol  (ZYLOPRIM ) 100 MG tablet Take 100 mg by mouth daily. 10/22/22   [provider]  amoxicillin -clavulanate (AUGMENTIN ) 875-125 MG tablet Take 1 tablet by mouth every 12 (twelve) hours. 06/05/23   Dettinger, Fonda LABOR, MD  apixaban  (ELIQUIS ) 5 MG TABS tablet Take 1 tablet (5 mg total) by mouth 2 (two) times daily. 09/09/22   Shlomo Wilbert SAUNDERS, MD  ARIPiprazole  (ABILIFY ) 2 MG tablet Take 2 mg by mouth at bedtime. 10/07/21   [provider]  atorvastatin  (LIPITOR) 80 MG tablet Take 1 tablet (80 mg total) by mouth daily. 02/05/23 05/19/23  Lucien Orren SAILOR, PA-C  cyanocobalamin  (VITAMIN B12) 1000 MCG tablet Take 1 tablet (1,000 mcg total) by mouth daily. 09/04/22   Ghimire, Donalda HERO, MD  denosumab  (PROLIA ) 60 MG/ML SOSY injection Inject 60 mg into the skin every 6 (six) months. 08/14/20   [name], MD  doxepin  (SINEQUAN ) 10 MG capsule Take 10 mg by mouth at bedtime. 11/27/22   [provider]   escitalopram  (LEXAPRO ) 10 MG tablet Take 10 mg by mouth daily.    [provider]  fluticasone -salmeterol (ADVAIR DISKUS) 100-50 MCG/ACT AEPB Inhale 1 puff into the lungs 2 (two) times daily. Patient taking differently: Inhale 1 puff into the lungs 2 (two) times daily as needed (for flares). 09/11/22   Parrett, Madelin RAMAN, NP  furosemide  (LASIX ) 20 MG tablet Take 20 mg by mouth daily.    [provider]  Galcanezumab -gnlm (EMGALITY ) 120 MG/ML SOAJ INJECT CONTENTS ON 1 PEN(120MG ) INTO THE SKIN ONCE MONTHLY Patient taking differently: Inject 120 mg into the skin every 30 (thirty) days. 01/23/23   Wertman, Sara E, PA-C  hydrochlorothiazide  (MICROZIDE ) 12.5 MG capsule Take 12.5 mg by mouth daily. 02/18/23   [provider]  hydrOXYzine  (VISTARIL ) 25 MG capsule Take 25 mg by mouth daily at 6 (six) AM. 04/10/23   [provider]  Insulin  Disposable Pump (OMNIPOD 5 G7 INTRO, GEN 5,) KIT 1 Device by Does not apply route every other day. 02/12/23   Shamleffer, Donell Cardinal, MD  Insulin  Disposable Pump (OMNIPOD 5 G7 PODS, GEN 5,) MISC 1 Device by Does not apply route every other day. 02/12/23   Shamleffer, Ibtehal Jaralla, MD  insulin  glargine (LANTUS  SOLOSTAR) 100 UNIT/ML Solostar Pen Inject 32 Units into the skin 2 (two) times daily. 05/22/23   Shamleffer, Ibtehal Jaralla, MD  insulin  lispro (HUMALOG  KWIKPEN) 100 UNIT/ML KwikPen Max daily 45 units 05/22/23   Shamleffer, Ibtehal Jaralla, MD  Insulin  Pen Needle 29G X MISC 1 Device by Does not apply route daily in the afternoon. 05/22/23   Shamleffer, Ibtehal Jaralla, MD  levocetirizine (XYZAL  ALLERGY 24HR) 5 MG tablet Take 1 tablet (5 mg total) by mouth every evening. For itch 05/12/23   [name], MD  lidocaine  (XYLOCAINE ) 2 % solution Use as directed 15 mLs in the mouth or throat every 4 (four) hours as needed for mouth pain. 05/18/23   [name], MD  LORazepam  (ATIVAN ) 0.5 MG tablet Take 0.5-1 mg by mouth See  admin instructions. Take 0.5 mg by mouth in the morning and 1 mg at bedtime    [provider]  losartan  (COZAAR ) 25 MG tablet Take 25 mg by mouth daily. 02/14/23   [provider]  meclizine  (ANTIVERT ) 25 MG tablet Take 1 tablet (25 mg total) by mouth 3 (three) times daily as needed for dizziness. 06/05/23   Dettinger, Fonda LABOR, MD  metFORMIN  (GLUCOPHAGE -XR) 500 MG 24 hr tablet Take 2 tablets (1,000 mg total) by mouth daily with breakfast. 05/22/23   Shamleffer, Donell Cardinal, MD  methocarbamol   1000 MG TABS Take 1,000 mg by mouth every 6 (six) hours as needed for muscle spasms. 02/16/23   Lovorn, Megan, MD  metoprolol  tartrate (LOPRESSOR ) 50 MG tablet Take 1 tablet (50 mg total) by mouth 2 (two) times daily. 01/06/23   Shlomo Wilbert SAUNDERS, MD  naloxone  (NARCAN ) nasal spray 4 mg/0.1 mL Place 1 spray into the nose once as needed (AS DIRECTED). 08/08/21   [provider]  nystatin  (MYCOSTATIN /NYSTOP ) powder Apply 1 Application topically as needed (rash). 06/16/22   Shamleffer, Ibtehal Jaralla, MD  nystatin  ointment (MYCOSTATIN ) Apply 1 Application topically 2 (two) times daily as needed (for rashes). 04/06/23   Signa Delon LABOR, NP  ondansetron  (ZOFRAN ) 4 MG tablet Take 1 tablet (4 mg total) by mouth every 8 (eight) hours as needed for nausea or vomiting. For cancer related nausea 02/16/23   Lovorn, Megan, MD  ondansetron  (ZOFRAN -ODT) 8 MG disintegrating tablet Take 1 tablet (8 mg total) by mouth every 6 (six) hours as needed for nausea or vomiting. 01/13/22   [name], MD  OXcarbazepine  (TRILEPTAL ) 150 MG tablet Take 1 tablet (150 mg total) by mouth 2 (two) times daily. 06/13/22   Sherrill Cable Latif, DO  oxyCODONE  (OXYCONTIN ) 20 mg 12 hr tablet Take 1 tablet (20 mg total) by mouth every 12 (twelve) hours. To replace MS Contin - having itching as well as increased pain since cancer dx- morphine  frequently not as good for cancer pain 04/16/23   Lovorn, Megan, MD  oxyCODONE  (OXYCONTIN )  20 mg 12 hr tablet Take 1 tablet (20 mg total) by mouth every 12 (twelve) hours. 06/12/23 to be filled 05/04/23   Lovorn, Megan, MD  Oxycodone  HCl 20 MG TABS Take 1 tablet by mouth 2 (two) times daily as needed. 04/16/23   [provider]  pantoprazole  (PROTONIX ) 40 MG tablet Take 40 mg by mouth daily as needed (for reflux).    [provider]  potassium chloride  (KLOR-CON ) 10 MEQ tablet Take 1 tablet (10 mEq total) by mouth daily. 02/05/23 05/14/23  Lucien Orren SAILOR, PA-C  pregabalin  (LYRICA ) 200 MG capsule Take 200 mg by mouth 2 (two) times daily.    [provider]  promethazine  (PHENERGAN ) 25 MG tablet Take 0.5-1 tablets (12.5-25 mg total) by mouth every 6 (six) hours as needed for refractory nausea / vomiting (cancer related nausea/vomiting). 02/16/23   Lovorn, Megan, MD  rOPINIRole  (REQUIP ) 1 MG tablet Take 1 tablet (1 mg total) by mouth at bedtime. For leg cramps 05/12/23   [name], MD  senna-docusate (SENOKOT-S) 8.6-50 MG tablet Take 1 tablet by mouth at bedtime. Patient taking differently: Take 1 tablet by mouth at bedtime as needed (for constipation). 06/13/22   Sherrill Cable Latif, DO  spironolactone  (ALDACTONE ) 25 MG tablet Take 0.5 tablets (12.5 mg total) by mouth daily. 01/31/23 05/14/23  Briana Elgin LABOR, MD  tirzepatide  (MOUNJARO ) 7.5 MG/0.5ML Pen Inject 7.5 mg into the skin once a week. 05/22/23   Shamleffer, Donell Cardinal, MD  TYLENOL  500 MG tablet Take 500-1,000 mg by mouth every 6 (six) hours as needed for mild pain (pain score 1-3) or headache.    [provider]  Vitamin D , Cholecalciferol , 25 MCG (1000 UT) TABS Take 1,000 Units by mouth daily in the afternoon. 07/26/20   [provider]    Family History Family History  Problem Relation Age of Onset   Diabetes Mother    Heart disease Mother    Alzheimer's disease Father    Heart disease Sister  CABG   Diabetes Sister    Alcohol  abuse Sister    Stroke Brother    Heart  disease Brother    Mental illness Brother    Diabetes Brother    Breast cancer Neg Hx    Ovarian cancer Neg Hx    Colon cancer Neg Hx    Endometrial cancer Neg Hx     Social History Social History   Tobacco Use   Smoking status: Never   Smokeless tobacco: Never  Vaping Use   Vaping status: Never Used  Substance Use Topics   Alcohol  use: Not Currently    Alcohol /week: 0.0 standard drinks of alcohol    Drug use: No     Allergies   Iodine, Ivp dye [iodinated contrast media], Latex, Tape, and Tizanidine    Review of Systems Review of Systems PER HPI  Physical Exam Triage Vital Signs ED Triage Vitals [06/03/23 1357]  Encounter Vitals Group     BP 120/81     Systolic BP Percentile      Diastolic BP Percentile      Pulse Rate 93     Resp 17     Temp 97.7 F (36.5 C)     Temp Source Oral     SpO2 94 %     Weight      Height      Head Circumference      Peak Flow      Pain Score      Pain Loc      Pain Education      Exclude from Growth Chart    No data found.  Updated Vital Signs BP 120/81 (BP Location: Right Arm)   Pulse 93   Temp 97.7 F (36.5 C) (Oral)   Resp 17   SpO2 94%   Visual Acuity Right Eye Distance:   Left Eye Distance:   Bilateral Distance:    Right Eye Near:   Left Eye Near:    Bilateral Near:     Physical Exam Vitals and nursing note reviewed.  Constitutional:      Appearance: Normal appearance.  HENT:     Head: Atraumatic.     Right Ear: Tympanic membrane and external ear normal.     Left Ear: Tympanic membrane and external ear normal.     Nose: Congestion present.     Mouth/Throat:     Mouth: Mucous membranes are moist.     Pharynx: Posterior oropharyngeal erythema present.  Eyes:     Extraocular Movements: Extraocular movements intact.     Conjunctiva/sclera: Conjunctivae normal.  Cardiovascular:     Rate and Rhythm: Normal rate.  Pulmonary:     Effort: Pulmonary effort is normal.     Breath sounds: Rales present. No  wheezing.  Musculoskeletal:        General: Normal range of motion.     Cervical back: Normal range of motion and neck supple.  Skin:    General: Skin is warm and dry.  Neurological:     Mental Status: She is alert and oriented to person, place, and time.  Psychiatric:        Mood and Affect: Mood normal.        Thought Content: Thought content normal.      UC Treatments / Results  Labs (all labs ordered are listed, but only abnormal results are displayed) Labs Reviewed - No data to display  EKG   Radiology No results found.  Procedures Procedures (including critical care time)  Medications Ordered in UC Medications - No data to display  Initial Impression / Assessment and Plan / UC Course  I have reviewed the triage vital signs and the nursing notes.  Pertinent labs & imaging results that were available during my care of the patient were reviewed by me and considered in my medical decision making (see chart for details).     CXR today showing right upper pneumonia. Treat with augmentin , mucinex , and continued inhaler regimen and OTC remedies. Follow up with PCP for recheck, return sooner for worsening sxs.  Final Clinical Impressions(s) / UC Diagnoses   Final diagnoses:  Acute cough  Lower respiratory infection  Moderate persistent asthma with acute exacerbation     Discharge Instructions      I will call if your radiology report from your chest x-ray shows anything that we have not already discussed.  I have sent in an antibiotic for a possible lower respiratory infection and you may take medications such as Coricidin HBP, plain Mucinex , Flonase  nasal spray, saline sinus rinses and use humidifiers and vapor rubs.  Follow-up for worsening symptoms at any time.    ED Prescriptions     Medication Sig Dispense Auth. Provider   amoxicillin -clavulanate (AUGMENTIN ) 875-125 MG tablet Take 1 tablet by mouth every 12 (twelve) hours. 14 tablet Stuart Vernell Norris, [place]      PDMP not reviewed this encounter.   Stuart Vernell Norris, [place] 06/07/23 2152

## 2023-06-08 ENCOUNTER — Ambulatory Visit
Admission: EM | Admit: 2023-06-08 | Discharge: 2023-06-08 | Disposition: A | Discharge status: Home / Self Care | Payer: Medicare Other | Attending: Nurse Practitioner | Admitting: Nurse Practitioner

## 2023-06-08 ENCOUNTER — Telehealth: Payer: Self-pay | Admitting: Family Medicine

## 2023-06-08 ENCOUNTER — Ambulatory Visit: Payer: Self-pay | Admitting: Family Medicine

## 2023-06-08 DIAGNOSIS — J189 Pneumonia, unspecified organism: Secondary | ICD-10-CM

## 2023-06-08 DIAGNOSIS — R062 Wheezing: Secondary | ICD-10-CM

## 2023-06-08 DIAGNOSIS — R399 Unspecified symptoms and signs involving the genitourinary system: Secondary | ICD-10-CM

## 2023-06-08 MED ORDER — PREDNISONE 20 MG PO TABS: 40.0000 mg | ORAL_TABLET | Freq: Every day | ORAL | 0 refills | Status: AC

## 2023-06-08 MED ORDER — AZITHROMYCIN 250 MG PO TABS: ORAL_TABLET | ORAL | 0 refills | Status: DC

## 2023-06-08 MED ORDER — IPRATROPIUM-ALBUTEROL 0.5-2.5 (3) MG/3ML IN SOLN
3.0000 mL | Freq: Once | RESPIRATORY_TRACT | Status: AC
  Administered 2023-06-08: 3 mL via RESPIRATORY_TRACT

## 2023-06-08 MED ORDER — METHYLPREDNISOLONE ACETATE 40 MG/ML IJ SUSP
40.0000 mg | Freq: Once | INTRAMUSCULAR | Status: AC
  Administered 2023-06-08: 40 mg via INTRAMUSCULAR

## 2023-06-08 MED ORDER — BENZONATATE 100 MG PO CAPS: 200.0000 mg | ORAL_CAPSULE | Freq: Three times a day (TID) | ORAL | 0 refills | Status: DC | PRN

## 2023-06-08 NOTE — Telephone Encounter (Signed)
 Chief Complaint: pneumonia symptoms not improved on antibiotic Symptoms: cough, wheezing, chest pain when coughing, vaginal itching (x 2 days), moderate SOB Frequency: x1 week; worsened x 2 days Pertinent Negatives: Patient denies fever Disposition: [x] ED /[] Urgent Care (no appt availability in office) / [] Appointment(In office/virtual)/ []  [facility]/ [] Home Care/ [x] Refused Recommended Disposition /[]  Mobile Bus/ []  Follow-up with PCP Additional Notes: Patient not improving after starting Augmentin  3 days ago for pneumonia. Patient c/o cough and wheezing worsening the past 2 days and states new chest pain with coughing started 2 days ago. Patient requesting medications including fluconazole  as she states she gets yeast infections on antibiotics. Patient asking for medication for cough and wheezing. Patient refused ED, and would like a call back from the office.  Copied from CRM [id number]. Topic: Appointments - Appointment Scheduling >> Jun 08, 2023 11:27 AM Monisha R wrote: Patient was diagnosed with pneumonia and cough has gotten worse. Reason for Disposition  New-onset or worsening chest pain  Answer Assessment - Initial Assessment Questions 1. SYMPTOM: What's the main symptom you're concerned about? (e.g., breathing difficulty, fever, weakness)     Cough, sharp chest pain when coughing and wheezing have not improved, worsened x 2 days.  2. ONSET: When did the pneumonia  start?     X 1 week.  3. BETTER-SAME-WORSE: Are you getting better, staying the same, or getting worse compared to the day you were discharged?     Patient states she feels things are worsened.  4. BREATHING DIFFICULTY: Are you having any difficulty breathing? If Yes, ask: How bad is it?  (e.g., none, mild, moderate, severe)   - MILD: No SOB at rest, mild SOB with walking, speaks normally in sentences, can lie down, no retractions, pulse < 100.    - MODERATE: SOB at rest, SOB with  minimal exertion and prefers to sit, cannot lie down flat, speaks in phrases, mild retractions, audible wheezing, pulse 100-120.    - SEVERE: Very SOB at rest, speaks in single words, struggling to breathe, sitting hunched forward, retractions, pulse > 120      Moderate SOB.  5. FEVER: Do you have a fever? If Yes, ask: What is your temperature, how was it measured, and when did it start?     Denies.  6. SPUTUM: Describe the color of your sputum (clear, white, yellow, green, blood-tinged)     Clear.  7. DIAGNOSIS CONFIRMATION: When was the pneumonia diagnosed? By whom?     ED provider diagnosed her with pneumonia on 06/03/23 and was seen in office on 06/05/23 and confirmed.  8. ANTIBIOTIC: Are you taking an antibiotic?  If Yes, ask: Which one? When was it started?     Augmentin , 06/04/22.   9. OTHER TREATMENT: Are you receiving any other treatment for the pneumonia? (e.g., albuterol  nebulizer, oxygen ) If Yes, ask: How often? and Does it help?     Patient states she only received antibiotic, requesting additional meds such as cough medicine.   10. HOSPITAL ADMISSION: Were you hospitalized for this pneumonia? If Yes, ask: When were you discharged home from the hospital?       Patient states  she was not hospitalized.  11. O2 SATURATION MONITOR:  Do you use an oxygen  saturation monitor (pulse oximeter) at home? If Yes, What is your reading (oxygen  level) today? What is your usual oxygen  saturation reading? (e.g., 95%)       Patient able to place pulse oximeter on finger and states it is  ready 95% and that is her usual reading.  Protocols used: Pneumonia Follow-up Call-[facility]

## 2023-06-08 NOTE — ED Triage Notes (Signed)
 Pt report cough,SOB, and chest pain x 2 weeks, was seen on 06/03/2023, diagnosed with bronchopneumonia  then saw primary care on 06/05/2023 was told to come here if she was not feeling any better. Pt was given Augmentin  on 06/05/2023, primary care extended antibiotic regimen. And provided meclizine  for dizziness.

## 2023-06-08 NOTE — Telephone Encounter (Signed)
 Pt has appt 01/07

## 2023-06-08 NOTE — Discharge Instructions (Signed)
 We gave you a breathing treatment today which helped open up your airway.  Continue your albuterol  inhaler every 4-6 hours around-the-clock for the next couple of days, then drop down to using as needed.  We also gave you an injection of steroid medicine today.  Start taking the oral prednisone  tomorrow.  Start the azithromycin  today as well as the cough suppressant medicine.  Seek care if symptoms do not improve with treatment.

## 2023-06-08 NOTE — Telephone Encounter (Signed)
 Sounds like if she is doing worse that she needs to be seen, if she will not go to the emergency department then see if she can come in and be seen here, I know we have had a lot of cancellations.  The only problem with our office is we do not have x-ray today but we could evaluate her if she did not want to go to the emergency department.

## 2023-06-08 NOTE — Telephone Encounter (Signed)
 Orders placed.

## 2023-06-08 NOTE — ED Provider Notes (Signed)
 [facility]    CSN: [id number] Arrival date & time: 06/08/23  1231      History   Chief Complaint No chief complaint on file.   HPI Amber Stephenson is a 78 y.o. female.   Patient presents today with 5-day history of congested cough, shortness of breath, wheezing, chest pain and tightness.  She denies known fevers, bodyaches or chills, runny or stuffy nose, sore throat, ear pain, abdominal pain, nausea/vomiting, and diarrhea.  Reports she was diagnosed with bronchopneumonia on 06/03/2023, treated with Augmentin  and states that she is not much better and she is in fact feeling worse.  She reports she began wheezing over the past couple of days.  Has been using rescue inhaler which seems to help temporarily, last use was this morning when she woke up.  Saw her primary care provider who extended Augmentin  for a full 2 weeks.  Is also taking over-the-counter Mucinex  without much benefit.    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   Benign paroxysmal positional vertigo due to bilateral vestibular disorder 05/20/2019   Bipolar 1 disorder (HCC) 01/23/2015   with GAD, benzo dependence   BPPV (benign paroxysmal positional vertigo) 05/20/2019   CAD (coronary artery disease)    Nonobstructive; Managed by Dr. Charls   Cardiomegaly 01/12/2018   CHF (congestive heart failure) 06/08/2022   Chronic back pain 01/04/2015   Chronic constipation 04/25/2020   Chronic diastolic heart failure (HCC) 05/30/2015   Chronic pain syndrome 08/22/2019   back pain, sacroiliitis   Chronic pain syndrome 08/22/2019   Chronic post-traumatic stress  disorder (PTSD) 12/06/2020   Diabetic neuropathy (HCC) 02/06/2016   Dyslipidemia 09/24/2020   Dyspnea on exertion    Essential hypertension    Family history of coronary arteriosclerosis 05/30/2015   Functional diarrhea 10/26/2020   Head trauma 09/17/2020   Hemorrhoids 03/11/2022   Herpes genitalis in women 07/16/2015   History of adenomatous polyp of colon 05/21/2019   Overview:   03/31/17: Colonoscopy: nonadvanced adenoma, microscopic colitis, f/u 5 yrs, [facility]/GAP   History of radiation therapy    Endometrial- 02/18/23-03/31/23-Dr. Lynwood Nasuti   Insomnia 01/23/2015   Lipoma 02/08/2015   Migraine headache with aura 02/12/2016   Myofascial pain dysfunction syndrome 08/22/2019   Myofascial pain dysfunction syndrome 08/22/2019   Non-alcoholic fatty liver disease 01/12/2018   Opioid dependence (HCC) 12/06/2020   OSA (obstructive sleep apnea) 02/24/2019   10/09/2018 - HST  - AHI 40.6    Osteopenia 12/06/2020   Rx alendronate  35 mg.   Pinguecula 12/06/2020   Postcoital bleeding 12/06/2020   Presbyopia 12/06/2020   Pulmonary hypertension    RLS (restless legs syndrome) 04/27/2015   Superficial fungus infection of skin 09/03/2021   Tremor, essential 12/11/2021   Type II diabetes mellitus (HCC) 09/24/2020    Patient Active Problem List   Diagnosis Date Noted   Candidiasis of mouth 05/15/2023   Nausea and vomiting 02/16/2023   Type 2 diabetes mellitus with hyperglycemia, with long-term current use of insulin  (HCC) 02/12/2023   Long term (current) use of oral hypoglycemic drugs 02/12/2023   Acute heart failure (HCC) 01/28/2023   Pure hypercholesterolemia 01/14/2023   Primary hypertension  01/14/2023   Metabolic acidosis 01/12/2023   Endometrial cancer (HCC) 01/12/2023   Acute on chronic respiratory failure with hypoxia (HCC) 01/12/2023   Vulvar itching 12/22/2022   PMB (postmenopausal bleeding) 11/13/2022   Superficial fungus infection of skin 11/13/2022   Burning with  urination 11/13/2022   UTI (urinary tract infection) 11/09/2022   Vitamin B12 deficiency 11/09/2022   Scalp hematoma, initial encounter 11/09/2022   Frequent falls 09/01/2022   Hypotension 09/01/2022   Renal insufficiency 08/08/2022   AKI (acute kidney injury) (HCC) 06/27/2022   Hemorrhoids 03/11/2022   Rectal itching 03/11/2022   Microalbuminuria 01/21/2022   Tremor, essential 12/11/2021   Chronic bilateral low back pain with right-sided sciatica 07/19/2021   Vaginal itching 04/16/2021   Sensory ataxia 03/28/2021   Acute diastolic CHF (congestive heart failure) (HCC) 12/16/2020   Elevated troponin 12/14/2020   Chronic post-traumatic stress disorder (PTSD) 12/06/2020   Senile nuclear sclerosis 12/06/2020   Senile corneal changes 12/06/2020   Postmenopausal bleeding 12/06/2020   Acquired hammer toe 12/06/2020   Opioid dependence 12/06/2020   Hypermetropia 12/06/2020   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   Generalized anxiety disorder 12/06/2020   Functional diarrhea 10/26/2020   Insulin  dependent type 2 diabetes mellitus (HCC) 09/24/2020   Chronic respiratory failure with hypoxia (HCC) 04/25/2020   Chronic constipation 04/25/2020   CAD (coronary artery disease)    Hypocalcemia    S/P shoulder replacement, 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   Mixed hyperlipidemia 05/20/2019   Vertigo 04/25/2019   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   OSA (obstructive sleep apnea) 02/24/2019   Acute on chronic hypoxic respiratory failure (HCC) 01/22/2019   Acute pulmonary edema (HCC) 01/21/2019   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   At risk for adverse drug event 07/06/2018   Non-alcoholic fatty liver disease 01/12/2018   Mild intermittent asthma 01/12/2018   Senile osteoporosis 12/15/2017   Atrial fibrillation with rapid ventricular response (HCC) 05/19/2017   Migraine headache with aura 02/12/2016   Diabetic neuropathy (HCC) 02/06/2016   Bipolar 1 disorder (HCC) 10/04/2015   Major  depressive disorder 10/03/2015   Herpes genitalis in women 07/16/2015   Long term current use of anticoagulant 05/30/2015   Chronic diastolic heart failure (HCC) 05/30/2015   Essential hypertension    RLS (restless legs syndrome) 04/27/2015   Insomnia 01/23/2015   Chronic atrial fibrillation with RVR (HCC) 01/04/2015    Past Surgical History:  Procedure Laterality Date   BREAST REDUCTION SURGERY     EYE SURGERY Right    cateracts   HAMMER TOE SURGERY     LEFT HEART CATH AND CORONARY ANGIOGRAPHY N/A 02/03/2018   Procedure: LEFT HEART CATH AND CORONARY ANGIOGRAPHY;  Surgeon: Jordan, Peter M, MD;  Location: [facility] INVASIVE CV LAB;  Service: Cardiovascular;  Laterality: N/A;   REVERSE SHOULDER ARTHROPLASTY Right 08/19/2019   Procedure: REVERSE SHOULDER ARTHROPLASTY;  Surgeon: Kay Kemps, MD;  Location: [facility];  Service: Orthopedics;  Laterality: Right;  interscalene block   SHOULDER SURGERY Right    I BROKE MY SHOUDLER   THIGH SURGERY     TO REMOVE A TUMOR     OB History     Gravida  3   Para  3   Term  3   Preterm      AB      Living  3      SAB      IAB      Ectopic      Multiple      Live Births  3  Home Medications    Prior to Admission medications   Medication Sig Start Date End Date Taking? Authorizing Provider  azithromycin  (ZITHROMAX ) 250 MG tablet Take (2) tablets by mouth on day 1, then take (1) tablet by mouth on days 2-5. 06/08/23  Yes Chandra Harlene LABOR, NP  benzonatate  (TESSALON ) 100 MG capsule Take 2 capsules (200 mg total) by mouth 3 (three) times daily as needed for cough. 06/08/23  Yes Chandra Harlene LABOR, NP  predniSONE  (DELTASONE ) 20 MG tablet Take 2 tablets (40 mg total) by mouth daily with breakfast for 5 days. 06/08/23 06/13/23 Yes Chandra Harlene LABOR, NP  albuterol  (VENTOLIN  HFA) 108 (90 Base) MCG/ACT inhaler Inhale 2 puffs into the lungs every 6 (six) hours as needed for wheezing or shortness of breath. 08/09/20   Landy Barnie RAMAN, NP  allopurinol  (ZYLOPRIM ) 100 MG tablet Take 100 mg by mouth daily. 10/22/22   [provider]  amoxicillin -clavulanate (AUGMENTIN ) 875-125 MG tablet Take 1 tablet by mouth every 12 (twelve) hours. 06/05/23   Dettinger, Fonda LABOR, MD  apixaban  (ELIQUIS ) 5 MG TABS tablet Take 1 tablet (5 mg total) by mouth 2 (two) times daily. 09/09/22   Shlomo Wilbert SAUNDERS, MD  ARIPiprazole  (ABILIFY ) 2 MG tablet Take 2 mg by mouth at bedtime. 10/07/21   [provider]  atorvastatin  (LIPITOR) 80 MG tablet Take 1 tablet (80 mg total) by mouth daily. 02/05/23 05/19/23  Lucien Orren SAILOR, PA-C  cyanocobalamin  (VITAMIN B12) 1000 MCG tablet Take 1 tablet (1,000 mcg total) by mouth daily. 09/04/22   Ghimire, Donalda HERO, MD  denosumab  (PROLIA ) 60 MG/ML SOSY injection Inject 60 mg into the skin every 6 (six) months. 08/14/20   [name], MD  doxepin  (SINEQUAN ) 10 MG capsule Take 10 mg by mouth at bedtime. 11/27/22   [provider]  escitalopram  (LEXAPRO ) 10 MG tablet Take 10 mg by mouth daily.    [provider]  fluticasone -salmeterol (ADVAIR DISKUS) 100-50 MCG/ACT AEPB Inhale 1 puff into the lungs 2 (two) times daily. Patient taking differently: Inhale 1 puff into the lungs 2 (two) times daily as needed (for flares). 09/11/22   Parrett, Madelin RAMAN, NP  furosemide  (LASIX ) 20 MG tablet Take 20 mg by mouth daily.    [provider]  Galcanezumab -gnlm (EMGALITY ) 120 MG/ML SOAJ INJECT CONTENTS ON 1 PEN(120MG ) INTO THE SKIN ONCE MONTHLY Patient taking differently: Inject 120 mg into the skin every 30 (thirty) days. 01/23/23   Wertman, Sara E, PA-C  hydrochlorothiazide  (MICROZIDE ) 12.5 MG capsule Take 12.5 mg by mouth daily. 02/18/23   [provider]  hydrOXYzine  (VISTARIL ) 25 MG capsule Take 25 mg by mouth daily at 6 (six) AM. 04/10/23   [provider]  Insulin  Disposable Pump (OMNIPOD 5 G7 INTRO, GEN 5,) KIT 1 Device by Does not apply route every other day. 02/12/23   Shamleffer,  Ibtehal Jaralla, MD  Insulin  Disposable Pump (OMNIPOD 5 G7 PODS, GEN 5,) MISC 1 Device by Does not apply route every other day. 02/12/23   Shamleffer, Ibtehal Jaralla, MD  insulin  glargine (LANTUS  SOLOSTAR) 100 UNIT/ML Solostar Pen Inject 32 Units into the skin 2 (two) times daily. 05/22/23   Shamleffer, Ibtehal Jaralla, MD  insulin  lispro (HUMALOG  KWIKPEN) 100 UNIT/ML KwikPen Max daily 45 units 05/22/23   Shamleffer, Ibtehal Jaralla, MD  Insulin  Pen Needle 29G X MISC 1 Device by Does not apply route daily in the afternoon. 05/22/23   Shamleffer, Ibtehal Jaralla, MD  levocetirizine (XYZAL  ALLERGY 24HR) 5  MG tablet Take 1 tablet (5 mg total) by mouth every evening. For itch 05/12/23   [name], MD  lidocaine  (XYLOCAINE ) 2 % solution Use as directed 15 mLs in the mouth or throat every 4 (four) hours as needed for mouth pain. 05/18/23   [name], MD  LORazepam  (ATIVAN ) 0.5 MG tablet Take 0.5-1 mg by mouth See admin instructions. Take 0.5 mg by mouth in the morning and 1 mg at bedtime    [provider]  losartan  (COZAAR ) 25 MG tablet Take 25 mg by mouth daily. 02/14/23   [provider]  meclizine  (ANTIVERT ) 25 MG tablet Take 1 tablet (25 mg total) by mouth 3 (three) times daily as needed for dizziness. 06/05/23   Dettinger, Fonda LABOR, MD  metFORMIN  (GLUCOPHAGE -XR) 500 MG 24 hr tablet Take 2 tablets (1,000 mg total) by mouth daily with breakfast. 05/22/23   Shamleffer, Donell Cardinal, MD  methocarbamol  1000 MG TABS Take 1,000 mg by mouth every 6 (six) hours as needed for muscle spasms. 02/16/23   Lovorn, Megan, MD  metoprolol  tartrate (LOPRESSOR ) 50 MG tablet Take 1 tablet (50 mg total) by mouth 2 (two) times daily. 01/06/23   Shlomo Wilbert SAUNDERS, MD  naloxone  (NARCAN ) nasal spray 4 mg/0.1 mL Place 1 spray into the nose once as needed (AS DIRECTED). 08/08/21   [provider]  nystatin  (MYCOSTATIN /NYSTOP ) powder Apply 1 Application topically as needed (rash). 06/16/22    Shamleffer, Ibtehal Jaralla, MD  nystatin  ointment (MYCOSTATIN ) Apply 1 Application topically 2 (two) times daily as needed (for rashes). 04/06/23   Signa Delon LABOR, NP  ondansetron  (ZOFRAN ) 4 MG tablet Take 1 tablet (4 mg total) by mouth every 8 (eight) hours as needed for nausea or vomiting. For cancer related nausea 02/16/23   Lovorn, Megan, MD  ondansetron  (ZOFRAN -ODT) 8 MG disintegrating tablet Take 1 tablet (8 mg total) by mouth every 6 (six) hours as needed for nausea or vomiting. 01/13/22   [name], MD  OXcarbazepine  (TRILEPTAL ) 150 MG tablet Take 1 tablet (150 mg total) by mouth 2 (two) times daily. 06/13/22   Sherrill Cable Latif, DO  oxyCODONE  (OXYCONTIN ) 20 mg 12 hr tablet Take 1 tablet (20 mg total) by mouth every 12 (twelve) hours. To replace MS Contin - having itching as well as increased pain since cancer dx- morphine  frequently not as good for cancer pain 04/16/23   Lovorn, Megan, MD  oxyCODONE  (OXYCONTIN ) 20 mg 12 hr tablet Take 1 tablet (20 mg total) by mouth every 12 (twelve) hours. 06/12/23 to be filled 05/04/23   Lovorn, Megan, MD  Oxycodone  HCl 20 MG TABS Take 1 tablet by mouth 2 (two) times daily as needed. 04/16/23   [provider]  pantoprazole  (PROTONIX ) 40 MG tablet Take 40 mg by mouth daily as needed (for reflux).    [provider]  potassium chloride  (KLOR-CON ) 10 MEQ tablet Take 1 tablet (10 mEq total) by mouth daily. 02/05/23 05/14/23  Lucien Orren SAILOR, PA-C  pregabalin  (LYRICA ) 200 MG capsule Take 200 mg by mouth 2 (two) times daily.    [provider]  promethazine  (PHENERGAN ) 25 MG tablet Take 0.5-1 tablets (12.5-25 mg total) by mouth every 6 (six) hours as needed for refractory nausea / vomiting (cancer related nausea/vomiting). 02/16/23   Lovorn, Megan, MD  rOPINIRole  (REQUIP ) 1 MG tablet Take 1 tablet (1 mg total) by mouth at bedtime. For leg cramps 05/12/23   [name], MD  senna-docusate (SENOKOT-S) 8.6-50 MG tablet Take 1 tablet  by  mouth at bedtime. Patient taking differently: Take 1 tablet by mouth at bedtime as needed (for constipation). 06/13/22   Sherrill Cable Latif, DO  spironolactone  (ALDACTONE ) 25 MG tablet Take 0.5 tablets (12.5 mg total) by mouth daily. 01/31/23 05/14/23  Briana Elgin LABOR, MD  tirzepatide  (MOUNJARO ) 7.5 MG/0.5ML Pen Inject 7.5 mg into the skin once a week. 05/22/23   Shamleffer, Ibtehal Jaralla, MD  TYLENOL  500 MG tablet Take 500-1,000 mg by mouth every 6 (six) hours as needed for mild pain (pain score 1-3) or headache.    [provider]  Vitamin D , Cholecalciferol , 25 MCG (1000 UT) TABS Take 1,000 Units by mouth daily in the afternoon. 07/26/20   [provider]    Family History Family History  Problem Relation Age of Onset   Diabetes Mother    Heart disease Mother    Alzheimer's disease Father    Heart disease Sister        CABG   Diabetes Sister    Alcohol  abuse Sister    Stroke Brother    Heart disease Brother    Mental illness Brother    Diabetes Brother    Breast cancer Neg Hx    Ovarian cancer Neg Hx    Colon cancer Neg Hx    Endometrial cancer Neg Hx     Social History Social History   Tobacco Use   Smoking status: Never   Smokeless tobacco: Never  Vaping Use   Vaping status: Never Used  Substance Use Topics   Alcohol  use: Not Currently    Alcohol /week: 0.0 standard drinks of alcohol    Drug use: No     Allergies   Iodine, Ivp dye [iodinated contrast media], Latex, Tape, and Tizanidine    Review of Systems Review of Systems Per HPI  Physical Exam Triage Vital Signs ED Triage Vitals  Encounter Vitals Group     BP 06/08/23 1247 125/84     Systolic BP Percentile --      Diastolic BP Percentile --      Pulse Rate 06/08/23 1247 87     Resp 06/08/23 1247 17     Temp 06/08/23 1247 (!) 97.5 F (36.4 C)     Temp Source 06/08/23 1247 Oral     SpO2 06/08/23 1247 93 %     Weight --      Height --      Head Circumference --      Peak Flow --       Pain Score 06/08/23 1254 5     Pain Loc --      Pain Education --      Exclude from Growth Chart --    No data found.  Updated Vital Signs BP 125/84 (BP Location: Right Arm)   Pulse 87   Temp (!) 97.5 F (36.4 C) (Oral)   Resp 17   SpO2 93%   Visual Acuity Right Eye Distance:   Left Eye Distance:   Bilateral Distance:    Right Eye Near:   Left Eye Near:    Bilateral Near:     Physical Exam Vitals and nursing note reviewed.  Constitutional:      General: She is not in acute distress.    Appearance: Normal appearance. She is not ill-appearing or toxic-appearing.  HENT:     Head: Normocephalic and atraumatic.     Right Ear: Tympanic membrane, ear canal and external ear normal.     Left Ear: Tympanic membrane, ear canal  and external ear normal.     Nose: No congestion or rhinorrhea.     Mouth/Throat:     Mouth: Mucous membranes are moist.     Pharynx: Oropharynx is clear. No oropharyngeal exudate or posterior oropharyngeal erythema.  Eyes:     General: No scleral icterus.    Extraocular Movements: Extraocular movements intact.  Cardiovascular:     Rate and Rhythm: Normal rate and regular rhythm.  Pulmonary:     Effort: Pulmonary effort is normal. No respiratory distress.     Breath sounds: Wheezing and rhonchi present. No rales.  Musculoskeletal:     Cervical back: Normal range of motion and neck supple.  Lymphadenopathy:     Cervical: No cervical adenopathy.  Skin:    General: Skin is warm and dry.     Coloration: Skin is not jaundiced or pale.     Findings: No erythema or rash.  Neurological:     Mental Status: She is alert and oriented to person, place, and time.  Psychiatric:        Behavior: Behavior is cooperative.      UC Treatments / Results  Labs (all labs ordered are listed, but only abnormal results are displayed) Labs Reviewed - No data to display  EKG   Radiology No results found.  Procedures Procedures (including critical care  time)  Medications Ordered in UC Medications  ipratropium-albuterol  (DUONEB) 0.5-2.5 (3) MG/3ML nebulizer solution 3 mL (3 mLs Nebulization Given 06/08/23 1342)  methylPREDNISolone  acetate (DEPO-MEDROL ) injection 40 mg (40 mg Intramuscular Given 06/08/23 1342)    Initial Impression / Assessment and Plan / UC Course  I have reviewed the triage vital signs and the nursing notes.  Pertinent labs & imaging results that were available during my care of the patient were reviewed by me and considered in my medical decision making (see chart for details).   Patient is well-appearing, normotensive, afebrile, not tachycardic, not tachypneic, oxygenating well on room air.    1. Pneumonia of right upper lobe due to infectious organism 2. Wheezing Overall, vitals and exam are stable today DuoNeb given as well as Depo-Medrol  IM with full improvement in wheezing SpO2 increased to 95% on room air after DuoNeb Recommended continuing albuterol  inhaler scheduled every 4-6 hours for the next 2 days, then use as needed Start oral prednisone  tomorrow as Depo-Medrol  was given today We will add an atypical coverage for pneumonia Start Tessalon  Perles as well for cough Return and ER precautions discussed with patient  The patient was given the opportunity to ask questions.  All questions answered to their satisfaction.  The patient is in agreement to this plan.   Final Clinical Impressions(s) / UC Diagnoses   Final diagnoses:  Pneumonia of right upper lobe due to infectious organism  Wheezing     Discharge Instructions      We gave you a breathing treatment today which helped open up your airway.  Continue your albuterol  inhaler every 4-6 hours around-the-clock for the next couple of days, then drop down to using as needed.  We also gave you an injection of steroid medicine today.  Start taking the oral prednisone  tomorrow.  Start the azithromycin  today as well as the cough suppressant medicine.  Seek care  if symptoms do not improve with treatment.    ED Prescriptions     Medication Sig Dispense Auth. Provider   azithromycin  (ZITHROMAX ) 250 MG tablet Take (2) tablets by mouth on day 1, then take (1) tablet by mouth  on days 2-5. 6 tablet Chandra Raisin A, NP   benzonatate  (TESSALON ) 100 MG capsule Take 2 capsules (200 mg total) by mouth 3 (three) times daily as needed for cough. 30 capsule Chandra Raisin A, NP   predniSONE  (DELTASONE ) 20 MG tablet Take 2 tablets (40 mg total) by mouth daily with breakfast for 5 days. 10 tablet Chandra Raisin LABOR, NP      PDMP not reviewed this encounter.   Chandra Raisin LABOR, NP 06/08/23 1422

## 2023-06-09 ENCOUNTER — Encounter: Payer: Self-pay | Admitting: Family

## 2023-06-09 ENCOUNTER — Telehealth: Payer: Medicare Other | Admitting: Family

## 2023-06-09 DIAGNOSIS — R051 Acute cough: Secondary | ICD-10-CM | POA: Diagnosis not present

## 2023-06-09 DIAGNOSIS — J189 Pneumonia, unspecified organism: Secondary | ICD-10-CM | POA: Diagnosis not present

## 2023-06-09 DIAGNOSIS — R0602 Shortness of breath: Secondary | ICD-10-CM

## 2023-06-09 MED ORDER — PROMETHAZINE-DM 6.25-15 MG/5ML PO SYRP: 5.0000 mL | ORAL_SOLUTION | Freq: Three times a day (TID) | ORAL | 0 refills | Status: DC | PRN

## 2023-06-09 NOTE — Progress Notes (Signed)
 Virtual Visit Consent   Amber Stephenson, you are scheduled for a virtual visit with a [place] provider today. Just as with appointments in the office, your consent must be obtained to participate. Your consent will be active for this visit and any virtual visit you may have with one of our providers in the next 365 days. If you have a MyChart account, a copy of this consent can be sent to you electronically.  As this is a virtual visit, video technology does not allow for your provider to perform a traditional examination. This may limit your provider's ability to fully assess your condition. If your provider identifies any concerns that need to be evaluated in person or the need to arrange testing (such as labs, EKG, etc.), we will make arrangements to do so. Although advances in technology are sophisticated, we cannot ensure that it will always work on either your end or our end. If the connection with a video visit is poor, the visit may have to be switched to a telephone visit. With either a video or telephone visit, we are not always able to ensure that we have a secure connection.  By engaging in this virtual visit, you consent to the provision of healthcare and authorize for your insurance to be billed (if applicable) for the services provided during this visit. Depending on your insurance coverage, you may receive a charge related to this service.  I need to obtain your verbal consent now. Are you willing to proceed with your visit today? Amber Stephenson has provided verbal consent on 06/09/2023 for a virtual visit (video or telephone). Bari Learn, FNP  Date: 06/09/2023 3:53 PM  Virtual Visit via Video Note   I, Bari Learn, connected with  Amber Stephenson  ([id number], 04-Aug-1945) on 06/09/23 at  3:40 PM EST by a video-enabled telemedicine application and verified that I am speaking with the correct person using two identifiers.  Location: Patient: Virtual Visit Location Patient:  Home Provider: Virtual Visit Location Provider: Home Office   I discussed the limitations of evaluation and management by telemedicine and the availability of in person appointments. The patient expressed understanding and agreed to proceed.    History of Present Illness: Amber Stephenson is a 78 y.o. who identifies as a female who was assigned female at birth, and is being seen today for pneumonia and cough. She was seen in the ED on yesterday and given DuoNeb, depo-medrol , and tessalon . Started on prednisone  today. She is taking Augmentin  and Zpak. Reports her breathing is improved, but continues to cough. Reports the tessalon  is not working.   HPI: Cough This is a new problem. The current episode started 1 to 4 weeks ago. The problem has been waxing and waning. The problem occurs every few minutes. The cough is Productive of sputum. Associated symptoms include chills, ear congestion, ear pain, headaches, myalgias, nasal congestion, postnasal drip, a sore throat, shortness of breath and wheezing. Pertinent negatives include no fever. She has tried OTC cough suppressant and rest for the symptoms. The treatment provided mild relief. Her past medical history is significant for asthma.    Problems:  Patient Active Problem List   Diagnosis Date Noted   Candidiasis of mouth 05/15/2023   Nausea and vomiting 02/16/2023   Type 2 diabetes mellitus with hyperglycemia, with long-term current use of insulin  (HCC) 02/12/2023   Long term (current) use of oral hypoglycemic drugs 02/12/2023   Acute heart failure (HCC) 01/28/2023   Pure hypercholesterolemia  01/14/2023   Primary hypertension 01/14/2023   Metabolic acidosis 01/12/2023   Endometrial cancer (HCC) 01/12/2023   Acute on chronic respiratory failure with hypoxia (HCC) 01/12/2023   Vulvar itching 12/22/2022   PMB (postmenopausal bleeding) 11/13/2022   Superficial fungus infection of skin 11/13/2022   Burning with urination 11/13/2022   UTI  (urinary tract infection) 11/09/2022   Vitamin B12 deficiency 11/09/2022   Scalp hematoma, initial encounter 11/09/2022   Frequent falls 09/01/2022   Hypotension 09/01/2022   Renal insufficiency 08/08/2022   AKI (acute kidney injury) (HCC) 06/27/2022   Hemorrhoids 03/11/2022   Rectal itching 03/11/2022   Microalbuminuria 01/21/2022   Tremor, essential 12/11/2021   Chronic bilateral low back pain with right-sided sciatica 07/19/2021   Vaginal itching 04/16/2021   Sensory ataxia 03/28/2021   Acute diastolic CHF (congestive heart failure) (HCC) 12/16/2020   Elevated troponin 12/14/2020   Chronic post-traumatic stress disorder (PTSD) 12/06/2020   Senile nuclear sclerosis 12/06/2020   Senile corneal changes 12/06/2020   Postmenopausal bleeding 12/06/2020   Acquired hammer toe 12/06/2020   Opioid dependence 12/06/2020   Hypermetropia 12/06/2020   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   Generalized anxiety disorder 12/06/2020   Functional diarrhea 10/26/2020   Insulin  dependent type 2 diabetes mellitus (HCC) 09/24/2020   Chronic respiratory failure with hypoxia (HCC) 04/25/2020   Chronic constipation 04/25/2020   CAD (coronary artery disease)    Hypocalcemia    S/P shoulder replacement, 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   Mixed hyperlipidemia 05/20/2019   Vertigo 04/25/2019   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   OSA (obstructive sleep apnea) 02/24/2019   Acute on chronic hypoxic respiratory failure (HCC) 01/22/2019   Acute pulmonary edema (HCC) 01/21/2019   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   At risk for adverse drug event 07/06/2018   Non-alcoholic fatty liver disease 01/12/2018   Mild intermittent asthma 01/12/2018   Senile osteoporosis 12/15/2017   Atrial fibrillation with rapid ventricular response (HCC) 05/19/2017   Migraine headache with aura 02/12/2016   Diabetic neuropathy (HCC) 02/06/2016   Bipolar 1 disorder (HCC) 10/04/2015   Major depressive disorder 10/03/2015    Herpes genitalis in women 07/16/2015   Long term current use of anticoagulant 05/30/2015   Chronic diastolic heart failure (HCC) 05/30/2015   Essential hypertension    RLS (restless legs syndrome) 04/27/2015   Insomnia 01/23/2015   Chronic atrial fibrillation with RVR (HCC) 01/04/2015    Allergies:  Allergies  Allergen Reactions   Iodine Anaphylaxis   Ivp Dye [Iodinated Contrast Media] Anaphylaxis, Swelling and Other (See Comments)    Throat closes    Latex Other (See Comments)    Latex tape pulls skin with it   Tape Other (See Comments)    Pulls off the skin, if latex   Tizanidine  Other (See Comments)    Weakness, goofy, bad dreams   Medications:  Current Outpatient Medications:    promethazine -dextromethorphan  (PROMETHAZINE -DM) 6.25-15 MG/5ML syrup, Take 5 mLs by mouth 3 (three) times daily as needed for cough., Disp: 118 mL, Rfl: 0   albuterol  (VENTOLIN  HFA) 108 (90 Base) MCG/ACT inhaler, Inhale 2 puffs into the lungs every 6 (six) hours as needed for wheezing or shortness of breath., Disp: 1 each, Rfl: 0   allopurinol  (ZYLOPRIM ) 100 MG tablet, Take 100 mg by mouth daily., Disp: , Rfl:    amoxicillin -clavulanate (AUGMENTIN ) 875-125 MG tablet, Take 1 tablet by mouth every 12 (twelve) hours., Disp: 14 tablet, Rfl: 0   apixaban  (ELIQUIS ) 5 MG TABS tablet, Take 1 tablet (5 mg total)  by mouth 2 (two) times daily., Disp: 180 tablet, Rfl: 3   ARIPiprazole  (ABILIFY ) 2 MG tablet, Take 2 mg by mouth at bedtime., Disp: , Rfl:    atorvastatin  (LIPITOR) 80 MG tablet, Take 1 tablet (80 mg total) by mouth daily., Disp: 90 tablet, Rfl: 3   azithromycin  (ZITHROMAX ) 250 MG tablet, Take (2) tablets by mouth on day 1, then take (1) tablet by mouth on days 2-5., Disp: 6 tablet, Rfl: 0   benzonatate  (TESSALON ) 100 MG capsule, Take 2 capsules (200 mg total) by mouth 3 (three) times daily as needed for cough., Disp: 30 capsule, Rfl: 0   cyanocobalamin  (VITAMIN B12) 1000 MCG tablet, Take 1 tablet (1,000  mcg total) by mouth daily., Disp: 30 tablet, Rfl: 1   denosumab  (PROLIA ) 60 MG/ML SOSY injection, Inject 60 mg into the skin every 6 (six) months., Disp: 1 mL, Rfl: 1   doxepin  (SINEQUAN ) 10 MG capsule, Take 10 mg by mouth at bedtime., Disp: , Rfl:    escitalopram  (LEXAPRO ) 10 MG tablet, Take 10 mg by mouth daily., Disp: , Rfl:    fluticasone -salmeterol (ADVAIR DISKUS) 100-50 MCG/ACT AEPB, Inhale 1 puff into the lungs 2 (two) times daily. (Patient taking differently: Inhale 1 puff into the lungs 2 (two) times daily as needed (for flares).), Disp: 1 each, Rfl: 5   furosemide  (LASIX ) 20 MG tablet, Take 20 mg by mouth daily., Disp: , Rfl:    Galcanezumab -gnlm (EMGALITY ) 120 MG/ML SOAJ, INJECT CONTENTS ON 1 PEN(120MG ) INTO THE SKIN ONCE MONTHLY (Patient taking differently: Inject 120 mg into the skin every 30 (thirty) days.), Disp: 1 mL, Rfl: 3   hydrochlorothiazide  (MICROZIDE ) 12.5 MG capsule, Take 12.5 mg by mouth daily., Disp: , Rfl:    hydrOXYzine  (VISTARIL ) 25 MG capsule, Take 25 mg by mouth daily at 6 (six) AM., Disp: , Rfl:    Insulin  Disposable Pump (OMNIPOD 5 G7 INTRO, GEN 5,) KIT, 1 Device by Does not apply route every other day., Disp: 1 kit, Rfl: 0   Insulin  Disposable Pump (OMNIPOD 5 G7 PODS, GEN 5,) MISC, 1 Device by Does not apply route every other day., Disp: 45 each, Rfl: 3   insulin  glargine (LANTUS  SOLOSTAR) 100 UNIT/ML Solostar Pen, Inject 32 Units into the skin 2 (two) times daily., Disp: 60 mL, Rfl: 4   insulin  lispro (HUMALOG  KWIKPEN) 100 UNIT/ML KwikPen, Max daily 45 units, Disp: 45 mL, Rfl: 6   Insulin  Pen Needle 29G X MISC, 1 Device by Does not apply route daily in the afternoon., Disp: 400 each, Rfl: 3   levocetirizine (XYZAL  ALLERGY 24HR) 5 MG tablet, Take 1 tablet (5 mg total) by mouth every evening. For itch, Disp: , Rfl:    lidocaine  (XYLOCAINE ) 2 % solution, Use as directed 15 mLs in the mouth or throat every 4 (four) hours as needed for mouth pain., Disp: 100 mL, Rfl:  5   LORazepam  (ATIVAN ) 0.5 MG tablet, Take 0.5-1 mg by mouth See admin instructions. Take 0.5 mg by mouth in the morning and 1 mg at bedtime, Disp: , Rfl:    losartan  (COZAAR ) 25 MG tablet, Take 25 mg by mouth daily., Disp: , Rfl:    meclizine  (ANTIVERT ) 25 MG tablet, Take 1 tablet (25 mg total) by mouth 3 (three) times daily as needed for dizziness., Disp: 60 tablet, Rfl: 1   metFORMIN  (GLUCOPHAGE -XR) 500 MG 24 hr tablet, Take 2 tablets (1,000 mg total) by mouth daily with breakfast., Disp: 180 tablet, Rfl: 3  methocarbamol  1000 MG TABS, Take 1,000 mg by mouth every 6 (six) hours as needed for muscle spasms., Disp: 120 tablet, Rfl: 5   metoprolol  tartrate (LOPRESSOR ) 50 MG tablet, Take 1 tablet (50 mg total) by mouth 2 (two) times daily., Disp: 180 tablet, Rfl: 2   naloxone  (NARCAN ) nasal spray 4 mg/0.1 mL, Place 1 spray into the nose once as needed (AS DIRECTED)., Disp: , Rfl:    nystatin  (MYCOSTATIN /NYSTOP ) powder, Apply 1 Application topically as needed (rash)., Disp: 15 g, Rfl: 2   nystatin  ointment (MYCOSTATIN ), Apply 1 Application topically 2 (two) times daily as needed (for rashes)., Disp: 30 g, Rfl: 1   ondansetron  (ZOFRAN ) 4 MG tablet, Take 1 tablet (4 mg total) by mouth every 8 (eight) hours as needed for nausea or vomiting. For cancer related nausea, Disp: 90 tablet, Rfl: 5   ondansetron  (ZOFRAN -ODT) 8 MG disintegrating tablet, Take 1 tablet (8 mg total) by mouth every 6 (six) hours as needed for nausea or vomiting., Disp: 20 tablet, Rfl: 1   OXcarbazepine  (TRILEPTAL ) 150 MG tablet, Take 1 tablet (150 mg total) by mouth 2 (two) times daily., Disp: 60 tablet, Rfl: 0   oxyCODONE  (OXYCONTIN ) 20 mg 12 hr tablet, Take 1 tablet (20 mg total) by mouth every 12 (twelve) hours. To replace MS Contin - having itching as well as increased pain since cancer dx- morphine  frequently not as good for cancer pain, Disp: 60 tablet, Rfl: 0   oxyCODONE  (OXYCONTIN ) 20 mg 12 hr tablet, Take 1 tablet (20 mg total)  by mouth every 12 (twelve) hours. 06/12/23 to be filled, Disp: 60 tablet, Rfl: 0   Oxycodone  HCl 20 MG TABS, Take 1 tablet by mouth 2 (two) times daily as needed., Disp: , Rfl:    pantoprazole  (PROTONIX ) 40 MG tablet, Take 40 mg by mouth daily as needed (for reflux)., Disp: , Rfl:    potassium chloride  (KLOR-CON ) 10 MEQ tablet, Take 1 tablet (10 mEq total) by mouth daily., Disp: 90 tablet, Rfl: 3   predniSONE  (DELTASONE ) 20 MG tablet, Take 2 tablets (40 mg total) by mouth daily with breakfast for 5 days., Disp: 10 tablet, Rfl: 0   pregabalin  (LYRICA ) 200 MG capsule, Take 200 mg by mouth 2 (two) times daily., Disp: , Rfl:    promethazine  (PHENERGAN ) 25 MG tablet, Take 0.5-1 tablets (12.5-25 mg total) by mouth every 6 (six) hours as needed for refractory nausea / vomiting (cancer related nausea/vomiting)., Disp: 120 tablet, Rfl: 5   rOPINIRole  (REQUIP ) 1 MG tablet, Take 1 tablet (1 mg total) by mouth at bedtime. For leg cramps, Disp: 30 tablet, Rfl: 5   senna-docusate (SENOKOT-S) 8.6-50 MG tablet, Take 1 tablet by mouth at bedtime. (Patient taking differently: Take 1 tablet by mouth at bedtime as needed (for constipation).), Disp: 30 tablet, Rfl: 0   spironolactone  (ALDACTONE ) 25 MG tablet, Take 0.5 tablets (12.5 mg total) by mouth daily., Disp: 15 tablet, Rfl: 2   tirzepatide  (MOUNJARO ) 7.5 MG/0.5ML Pen, Inject 7.5 mg into the skin once a week., Disp: 6 mL, Rfl: 3   TYLENOL  500 MG tablet, Take 500-1,000 mg by mouth every 6 (six) hours as needed for mild pain (pain score 1-3) or headache., Disp: , Rfl:    Vitamin D , Cholecalciferol , 25 MCG (1000 UT) TABS, Take 1,000 Units by mouth daily in the afternoon., Disp: , Rfl:   Current Facility-Administered Medications:    denosumab  (PROLIA ) injection 60 mg, 60 mg, Subcutaneous, Once, [name], MD  Facility-Administered Medications Ordered in  Other Visits:    bupivacaine -epinephrine  (MARCAINE  W/ EPI) 0.5% -1:200000 injection, , , Anesthesia Intra-op,  Tilford Franky BIRCH, MD, 12 mL at 01/21/19 0935  Observations/Objective: Patient is well-developed, well-nourished in no acute distress.  Resting comfortably  at home.  Head is normocephalic, atraumatic.  No labored breathing.  Speech is clear and coherent with logical content.  Patient is alert and oriented at baseline.  Weakness, dry cough  Assessment and Plan: 1. Pneumonia of right upper lobe due to infectious organism (Primary) - promethazine -dextromethorphan  (PROMETHAZINE -DM) 6.25-15 MG/5ML syrup; Take 5 mLs by mouth 3 (three) times daily as needed for cough.  Dispense: 118 mL; Refill: 0  2. Acute cough - promethazine -dextromethorphan  (PROMETHAZINE -DM) 6.25-15 MG/5ML syrup; Take 5 mLs by mouth 3 (three) times daily as needed for cough.  Dispense: 118 mL; Refill: 0  3. SOB (shortness of breath) - promethazine -dextromethorphan  (PROMETHAZINE -DM) 6.25-15 MG/5ML syrup; Take 5 mLs by mouth 3 (three) times daily as needed for cough.  Dispense: 118 mL; Refill: 0  Continue Augmentin  and Zpak  Continue tessalon   - Take meds as prescribed - Use a cool mist humidifier  -Use saline nose sprays frequently -Force fluids -For any cough or congestion  Use plain Mucinex - regular strength or max strength is fine -For fever or aces or pains- take tylenol  or ibuprofen . -Throat lozenges if help -Keep follow up with PCP to have repeat chest xray Red flags discussed to go to ED for increased SOB  Follow Up Instructions: I discussed the assessment and treatment plan with the patient. The patient was provided an opportunity to ask questions and all were answered. The patient agreed with the plan and demonstrated an understanding of the instructions.  A copy of instructions were sent to the patient via MyChart unless otherwise noted below.     The patient was advised to call back or seek an in-person evaluation if the symptoms worsen or if the condition fails to improve as anticipated.    Bari Learn, FNP

## 2023-06-10 ENCOUNTER — Other Ambulatory Visit: Payer: Self-pay

## 2023-06-10 ENCOUNTER — Observation Stay (HOSPITAL_COMMUNITY): Payer: Medicare Other

## 2023-06-10 ENCOUNTER — Emergency Department (HOSPITAL_BASED_OUTPATIENT_CLINIC_OR_DEPARTMENT_OTHER): Payer: Medicare Other

## 2023-06-10 ENCOUNTER — Ambulatory Visit: Payer: Self-pay | Admitting: Family Medicine

## 2023-06-10 ENCOUNTER — Inpatient Hospital Stay (HOSPITAL_BASED_OUTPATIENT_CLINIC_OR_DEPARTMENT_OTHER)
Admission: EM | Admit: 2023-06-10 | Discharge: 2023-06-12 | DRG: 202 | Disposition: A | Discharge status: Home Health | Payer: Medicare Other | Attending: Student | Admitting: Student

## 2023-06-10 ENCOUNTER — Encounter (HOSPITAL_BASED_OUTPATIENT_CLINIC_OR_DEPARTMENT_OTHER): Payer: Self-pay | Admitting: Emergency Medicine

## 2023-06-10 ENCOUNTER — Telehealth: Payer: Self-pay

## 2023-06-10 ENCOUNTER — Emergency Department (HOSPITAL_BASED_OUTPATIENT_CLINIC_OR_DEPARTMENT_OTHER): Payer: Medicare Other | Admitting: Radiology

## 2023-06-10 DIAGNOSIS — R109 Unspecified abdominal pain: Secondary | ICD-10-CM

## 2023-06-10 DIAGNOSIS — Z7951 Long term (current) use of inhaled steroids: Secondary | ICD-10-CM

## 2023-06-10 DIAGNOSIS — I4891 Unspecified atrial fibrillation: Secondary | ICD-10-CM | POA: Diagnosis present

## 2023-06-10 DIAGNOSIS — Z91048 Other nonmedicinal substance allergy status: Secondary | ICD-10-CM

## 2023-06-10 DIAGNOSIS — B338 Other specified viral diseases: Secondary | ICD-10-CM

## 2023-06-10 DIAGNOSIS — E871 Hypo-osmolality and hyponatremia: Principal | ICD-10-CM | POA: Diagnosis present

## 2023-06-10 DIAGNOSIS — J45901 Unspecified asthma with (acute) exacerbation: Secondary | ICD-10-CM | POA: Diagnosis present

## 2023-06-10 DIAGNOSIS — I2510 Atherosclerotic heart disease of native coronary artery without angina pectoris: Secondary | ICD-10-CM | POA: Diagnosis present

## 2023-06-10 DIAGNOSIS — Z794 Long term (current) use of insulin: Secondary | ICD-10-CM | POA: Diagnosis not present

## 2023-06-10 DIAGNOSIS — B974 Respiratory syncytial virus as the cause of diseases classified elsewhere: Secondary | ICD-10-CM | POA: Diagnosis present

## 2023-06-10 DIAGNOSIS — Y92009 Unspecified place in unspecified non-institutional (private) residence as the place of occurrence of the external cause: Secondary | ICD-10-CM | POA: Diagnosis not present

## 2023-06-10 DIAGNOSIS — J324 Chronic pansinusitis: Secondary | ICD-10-CM | POA: Diagnosis present

## 2023-06-10 DIAGNOSIS — Z1152 Encounter for screening for COVID-19: Secondary | ICD-10-CM | POA: Diagnosis not present

## 2023-06-10 DIAGNOSIS — G2581 Restless legs syndrome: Secondary | ICD-10-CM | POA: Diagnosis present

## 2023-06-10 DIAGNOSIS — R1031 Right lower quadrant pain: Secondary | ICD-10-CM | POA: Diagnosis not present

## 2023-06-10 DIAGNOSIS — Z9981 Dependence on supplemental oxygen: Secondary | ICD-10-CM | POA: Diagnosis not present

## 2023-06-10 DIAGNOSIS — R531 Weakness: Secondary | ICD-10-CM | POA: Diagnosis not present

## 2023-06-10 DIAGNOSIS — Z6832 Body mass index (BMI) 32.0-32.9, adult: Secondary | ICD-10-CM | POA: Diagnosis not present

## 2023-06-10 DIAGNOSIS — G894 Chronic pain syndrome: Secondary | ICD-10-CM | POA: Diagnosis present

## 2023-06-10 DIAGNOSIS — W19XXXA Unspecified fall, initial encounter: Secondary | ICD-10-CM | POA: Diagnosis present

## 2023-06-10 DIAGNOSIS — I110 Hypertensive heart disease with heart failure: Secondary | ICD-10-CM | POA: Diagnosis present

## 2023-06-10 DIAGNOSIS — E1142 Type 2 diabetes mellitus with diabetic polyneuropathy: Secondary | ICD-10-CM | POA: Diagnosis present

## 2023-06-10 DIAGNOSIS — E669 Obesity, unspecified: Secondary | ICD-10-CM | POA: Diagnosis present

## 2023-06-10 DIAGNOSIS — F4312 Post-traumatic stress disorder, chronic: Secondary | ICD-10-CM | POA: Diagnosis present

## 2023-06-10 DIAGNOSIS — R0989 Other specified symptoms and signs involving the circulatory and respiratory systems: Secondary | ICD-10-CM | POA: Diagnosis not present

## 2023-06-10 DIAGNOSIS — Z8249 Family history of ischemic heart disease and other diseases of the circulatory system: Secondary | ICD-10-CM

## 2023-06-10 DIAGNOSIS — Z96611 Presence of right artificial shoulder joint: Secondary | ICD-10-CM | POA: Diagnosis not present

## 2023-06-10 DIAGNOSIS — S0990XA Unspecified injury of head, initial encounter: Secondary | ICD-10-CM | POA: Diagnosis not present

## 2023-06-10 DIAGNOSIS — Z833 Family history of diabetes mellitus: Secondary | ICD-10-CM

## 2023-06-10 DIAGNOSIS — R1010 Upper abdominal pain, unspecified: Secondary | ICD-10-CM | POA: Diagnosis not present

## 2023-06-10 DIAGNOSIS — E1165 Type 2 diabetes mellitus with hyperglycemia: Secondary | ICD-10-CM

## 2023-06-10 DIAGNOSIS — Z8542 Personal history of malignant neoplasm of other parts of uterus: Secondary | ICD-10-CM

## 2023-06-10 DIAGNOSIS — Z7985 Long-term (current) use of injectable non-insulin antidiabetic drugs: Secondary | ICD-10-CM

## 2023-06-10 DIAGNOSIS — E785 Hyperlipidemia, unspecified: Secondary | ICD-10-CM | POA: Diagnosis present

## 2023-06-10 DIAGNOSIS — D649 Anemia, unspecified: Secondary | ICD-10-CM | POA: Diagnosis present

## 2023-06-10 DIAGNOSIS — Z91040 Latex allergy status: Secondary | ICD-10-CM

## 2023-06-10 DIAGNOSIS — E860 Dehydration: Secondary | ICD-10-CM | POA: Diagnosis present

## 2023-06-10 DIAGNOSIS — G4700 Insomnia, unspecified: Secondary | ICD-10-CM | POA: Diagnosis present

## 2023-06-10 DIAGNOSIS — R058 Other specified cough: Secondary | ICD-10-CM | POA: Diagnosis not present

## 2023-06-10 DIAGNOSIS — J9611 Chronic respiratory failure with hypoxia: Secondary | ICD-10-CM | POA: Diagnosis present

## 2023-06-10 DIAGNOSIS — Z823 Family history of stroke: Secondary | ICD-10-CM

## 2023-06-10 DIAGNOSIS — Z7901 Long term (current) use of anticoagulants: Secondary | ICD-10-CM

## 2023-06-10 DIAGNOSIS — Z820 Family history of epilepsy and other diseases of the nervous system: Secondary | ICD-10-CM

## 2023-06-10 DIAGNOSIS — R918 Other nonspecific abnormal finding of lung field: Secondary | ICD-10-CM | POA: Diagnosis not present

## 2023-06-10 DIAGNOSIS — F319 Bipolar disorder, unspecified: Secondary | ICD-10-CM | POA: Diagnosis present

## 2023-06-10 DIAGNOSIS — R197 Diarrhea, unspecified: Secondary | ICD-10-CM

## 2023-06-10 DIAGNOSIS — K5730 Diverticulosis of large intestine without perforation or abscess without bleeding: Secondary | ICD-10-CM | POA: Diagnosis not present

## 2023-06-10 DIAGNOSIS — Z91041 Radiographic dye allergy status: Secondary | ICD-10-CM

## 2023-06-10 DIAGNOSIS — Z923 Personal history of irradiation: Secondary | ICD-10-CM | POA: Diagnosis not present

## 2023-06-10 DIAGNOSIS — K219 Gastro-esophageal reflux disease without esophagitis: Secondary | ICD-10-CM | POA: Diagnosis present

## 2023-06-10 DIAGNOSIS — R0602 Shortness of breath: Secondary | ICD-10-CM | POA: Diagnosis not present

## 2023-06-10 DIAGNOSIS — Z79899 Other long term (current) drug therapy: Secondary | ICD-10-CM

## 2023-06-10 DIAGNOSIS — Z7984 Long term (current) use of oral hypoglycemic drugs: Secondary | ICD-10-CM

## 2023-06-10 DIAGNOSIS — Z888 Allergy status to other drugs, medicaments and biological substances status: Secondary | ICD-10-CM

## 2023-06-10 DIAGNOSIS — G4733 Obstructive sleep apnea (adult) (pediatric): Secondary | ICD-10-CM | POA: Diagnosis present

## 2023-06-10 DIAGNOSIS — Z91018 Allergy to other foods: Secondary | ICD-10-CM

## 2023-06-10 DIAGNOSIS — I5032 Chronic diastolic (congestive) heart failure: Secondary | ICD-10-CM | POA: Insufficient documentation

## 2023-06-10 DIAGNOSIS — J9811 Atelectasis: Secondary | ICD-10-CM | POA: Diagnosis not present

## 2023-06-10 DIAGNOSIS — I272 Pulmonary hypertension, unspecified: Secondary | ICD-10-CM | POA: Diagnosis present

## 2023-06-10 DIAGNOSIS — Z7952 Long term (current) use of systemic steroids: Secondary | ICD-10-CM

## 2023-06-10 HISTORY — DX: Other specified viral diseases: B33.8

## 2023-06-10 LAB — COMPREHENSIVE METABOLIC PANEL
ALT: 14 U/L (ref 0–44)
AST: 24 U/L (ref 15–41)
Albumin: 3.9 g/dL (ref 3.5–5.0)
Alkaline Phosphatase: 81 U/L (ref 38–126)
Anion gap: 4 — ABNORMAL LOW (ref 5–15)
BUN: 11 mg/dL (ref 8–23)
CO2: 29 mmol/L (ref 22–32)
Calcium: 8.9 mg/dL (ref 8.9–10.3)
Chloride: 90 mmol/L — ABNORMAL LOW (ref 98–111)
Creatinine, Ser: 0.74 mg/dL (ref 0.44–1.00)
GFR, Estimated: >60 mL/min (ref >60)
Glucose, Bld: 134 mg/dL — ABNORMAL HIGH (ref 70–99)
Potassium: 3.7 mmol/L (ref 3.5–5.1)
Sodium: 123 mmol/L — ABNORMAL LOW (ref 135–145)
Total Bilirubin: 0.4 mg/dL (ref 0.0–1.2)
Total Protein: 6.8 g/dL (ref 6.5–8.1)

## 2023-06-10 LAB — CBC
HCT: 31.6 % — ABNORMAL LOW (ref 36.0–46.0)
Hemoglobin: 11 g/dL — ABNORMAL LOW (ref 12.0–15.0)
MCH: 30.8 pg (ref 26.0–34.0)
MCHC: 34.8 g/dL (ref 30.0–36.0)
MCV: 88.5 fL (ref 80.0–100.0)
Platelets: 177 10*3/uL (ref 150–400)
RBC: 3.57 MIL/uL — ABNORMAL LOW (ref 3.87–5.11)
RDW: 16.1 % — ABNORMAL HIGH (ref 11.5–15.5)
WBC: 4.6 10*3/uL (ref 4.0–10.5)
nRBC: 0 % (ref 0.0–0.2)

## 2023-06-10 LAB — BASIC METABOLIC PANEL
Anion gap: 13 (ref 5–15)
BUN: 12 mg/dL (ref 8–23)
CO2: 20 mmol/L — ABNORMAL LOW (ref 22–32)
Calcium: 8.9 mg/dL (ref 8.9–10.3)
Chloride: 99 mmol/L (ref 98–111)
Creatinine, Ser: 0.75 mg/dL (ref 0.44–1.00)
GFR, Estimated: >60 mL/min (ref >60)
Glucose, Bld: 258 mg/dL — ABNORMAL HIGH (ref 70–99)
Potassium: 4.3 mmol/L (ref 3.5–5.1)
Sodium: 132 mmol/L — ABNORMAL LOW (ref 135–145)

## 2023-06-10 LAB — RESP PANEL BY RT-PCR (RSV, FLU A&B, COVID)  RVPGX2
Influenza A by PCR: NEGATIVE
Influenza B by PCR: NEGATIVE
Resp Syncytial Virus by PCR: POSITIVE — AB
SARS Coronavirus 2 by RT PCR: NEGATIVE

## 2023-06-10 LAB — C DIFFICILE QUICK SCREEN W PCR REFLEX
C Diff antigen: NEGATIVE
C Diff interpretation: NOT DETECTED
C Diff toxin: NEGATIVE

## 2023-06-10 LAB — TROPONIN I (HIGH SENSITIVITY): Troponin I (High Sensitivity): 14 ng/L (ref <18)

## 2023-06-10 LAB — GLUCOSE, CAPILLARY: Glucose-Capillary: 253 mg/dL — ABNORMAL HIGH (ref 70–99)

## 2023-06-10 LAB — BRAIN NATRIURETIC PEPTIDE: B Natriuretic Peptide: 304.2 pg/mL — ABNORMAL HIGH (ref 0.0–100.0)

## 2023-06-10 MED ORDER — ACETAMINOPHEN 325 MG PO TABS
650.0000 mg | ORAL_TABLET | Freq: Four times a day (QID) | ORAL | Status: DC | PRN
Start: 2023-06-10 — End: 2023-06-12
  Filled 2023-06-10: qty 2

## 2023-06-10 MED ORDER — DEXTROMETHORPHAN POLISTIREX ER 30 MG/5ML PO SUER
15.0000 mg | Freq: Two times a day (BID) | ORAL | Status: DC
  Administered 2023-06-11 (×2): 15 mg via ORAL
  Filled 2023-06-10 (×2): qty 5

## 2023-06-10 MED ORDER — MOMETASONE FURO-FORMOTEROL FUM 100-5 MCG/ACT IN AERO
2.0000 | INHALATION_SPRAY | Freq: Two times a day (BID) | RESPIRATORY_TRACT | Status: DC
  Administered 2023-06-11: 2 via RESPIRATORY_TRACT
  Filled 2023-06-10: qty 8.8

## 2023-06-10 MED ORDER — IPRATROPIUM-ALBUTEROL 0.5-2.5 (3) MG/3ML IN SOLN
3.0000 mL | Freq: Once | RESPIRATORY_TRACT | Status: AC
  Administered 2023-06-10: 3 mL via RESPIRATORY_TRACT
  Filled 2023-06-10: qty 3

## 2023-06-10 MED ORDER — ACETAMINOPHEN 650 MG RE SUPP: 650.0000 mg | Freq: Four times a day (QID) | RECTAL | Status: DC | PRN

## 2023-06-10 MED ORDER — IOHEXOL 300 MG/ML  SOLN: 100.0000 mL | Freq: Once | INTRAMUSCULAR | Status: DC | PRN

## 2023-06-10 MED ORDER — ALBUTEROL SULFATE HFA 108 (90 BASE) MCG/ACT IN AERS
2.0000 | INHALATION_SPRAY | RESPIRATORY_TRACT | Status: DC | PRN
  Administered 2023-06-10: 2 via RESPIRATORY_TRACT
  Filled 2023-06-10: qty 6.7

## 2023-06-10 MED ORDER — SODIUM CHLORIDE 0.9 % IV BOLUS
500.0000 mL | Freq: Once | INTRAVENOUS | Status: AC
  Administered 2023-06-10: 500 mL via INTRAVENOUS

## 2023-06-10 MED ORDER — IOHEXOL 350 MG/ML SOLN
100.0000 mL | Freq: Once | INTRAVENOUS | Status: AC | PRN
  Administered 2023-06-10: 75 mL via INTRAVENOUS

## 2023-06-10 MED ORDER — ALBUTEROL SULFATE (2.5 MG/3ML) 0.083% IN NEBU
2.5000 mg | INHALATION_SOLUTION | Freq: Once | RESPIRATORY_TRACT | Status: AC
  Administered 2023-06-10: 2.5 mg via RESPIRATORY_TRACT
  Filled 2023-06-10: qty 3

## 2023-06-10 MED ORDER — METHYLPREDNISOLONE SODIUM SUCC 40 MG IJ SOLR: 40.0000 mg | Freq: Once | INTRAMUSCULAR | Status: DC

## 2023-06-10 MED ORDER — OXYCODONE HCL 5 MG PO TABS
20.0000 mg | ORAL_TABLET | Freq: Once | ORAL | Status: AC
  Administered 2023-06-10: 20 mg via ORAL
  Filled 2023-06-10: qty 4

## 2023-06-10 MED ORDER — DIPHENHYDRAMINE HCL 50 MG/ML IJ SOLN
50.0000 mg | Freq: Once | INTRAMUSCULAR | Status: AC
Start: 2023-06-10 — End: 2023-06-10
  Administered 2023-06-10: 50 mg via INTRAVENOUS
  Filled 2023-06-10: qty 1

## 2023-06-10 MED ORDER — METHYLPREDNISOLONE SODIUM SUCC 40 MG IJ SOLR
40.0000 mg | Freq: Once | INTRAMUSCULAR | Status: AC
  Administered 2023-06-10: 40 mg via INTRAVENOUS
  Filled 2023-06-10: qty 1

## 2023-06-10 MED ORDER — BENZONATATE 100 MG PO CAPS
200.0000 mg | ORAL_CAPSULE | Freq: Once | ORAL | Status: AC
  Administered 2023-06-10: 200 mg via ORAL
  Filled 2023-06-10: qty 2

## 2023-06-10 MED ORDER — SODIUM CHLORIDE 0.9 % IV SOLN: INTRAVENOUS | Status: DC

## 2023-06-10 MED ORDER — DIPHENHYDRAMINE HCL 50 MG/ML IJ SOLN: 50.0000 mg | Freq: Once | INTRAMUSCULAR | Status: DC

## 2023-06-10 MED ORDER — DIPHENHYDRAMINE HCL 25 MG PO CAPS: 50.0000 mg | ORAL_CAPSULE | Freq: Once | ORAL | Status: DC

## 2023-06-10 MED ORDER — BENZONATATE 100 MG PO CAPS
100.0000 mg | ORAL_CAPSULE | Freq: Three times a day (TID) | ORAL | Status: DC | PRN
  Filled 2023-06-10: qty 1

## 2023-06-10 MED ORDER — INSULIN ASPART 100 UNIT/ML IJ SOLN
0.0000 [IU] | INTRAMUSCULAR | Status: DC
  Administered 2023-06-10: 5 [IU] via SUBCUTANEOUS
  Administered 2023-06-11 (×2): 2 [IU] via SUBCUTANEOUS

## 2023-06-10 MED ORDER — DIPHENHYDRAMINE HCL 25 MG PO CAPS
50.0000 mg | ORAL_CAPSULE | Freq: Once | ORAL | Status: AC
Start: 2023-06-10 — End: 2023-06-10

## 2023-06-10 MED ORDER — AEROCHAMBER PLUS FLO-VU SMALL MISC
1.0000 | Freq: Once | Status: AC
  Administered 2023-06-10: 1
  Filled 2023-06-10: qty 1

## 2023-06-10 MED ORDER — ALBUTEROL SULFATE (2.5 MG/3ML) 0.083% IN NEBU
INHALATION_SOLUTION | RESPIRATORY_TRACT | Status: AC
  Filled 2023-06-10: qty 3

## 2023-06-10 MED ORDER — ALBUTEROL SULFATE (2.5 MG/3ML) 0.083% IN NEBU
2.5000 mg | INHALATION_SOLUTION | Freq: Four times a day (QID) | RESPIRATORY_TRACT | Status: DC | PRN
  Administered 2023-06-10 – 2023-06-11 (×2): 2.5 mg via RESPIRATORY_TRACT
  Filled 2023-06-10: qty 3

## 2023-06-10 NOTE — ED Notes (Signed)
 Pt had two small bowel movements but not able to provide a sample without mixing urine.

## 2023-06-10 NOTE — Telephone Encounter (Signed)
 Prolia VOB initiated via [email or web address]

## 2023-06-10 NOTE — ED Notes (Signed)
 Called [facility] to transport patient to [facility] Rm# [id number]

## 2023-06-10 NOTE — H&P (Signed)
 History and Physical    Amber Stephenson FMW:[id number] DOB: [birth year] DOA: 06/10/2023  PCP: [name], MD  Patient coming from: [facility]  HPI: Amber Stephenson is a 78 y.o. female with medical history significant of CAD, chronic diastolic heart failure, A-fib on Eliquis , chronic hypoxic respiratory failure on 2 L [place], asthma, allergic rhinitis, insulin -dependent type 2 diabetes, peripheral neuropathy, hypertension, hyperlipidemia, endometrial cancer s/p radiation therapy, bipolar disorder, PTSD, BPPV, chronic pain syndrome, OSA, pulmonary hypertension, RLS, essential tremor, GERD, osteopenia, and other medical comorbidities.  Patient was seen by urgent care on 06/03/2023 for URI symptoms and chest x-ray was showing right upper lobe perihilar opacity concerning for early bronchopneumonia.  She was prescribed Augmentin .  Seen at urgent care again on 1/6 due to persistent symptoms and was prescribed azithromycin  and prednisone .  Patient presented to [facility] today with complaints of cough, shortness of breath, nausea, and diarrhea.  Vital signs on arrival: Temperature 98.1 F, pulse 94, respiratory rate 18, blood pressure 158/83, and SpO2 96% on room air.  Labs showing no leukocytosis, hemoglobin 11.0 (stable), sodium 123 (was 127 on labs done almost 4 weeks ago and previously 130-135), chloride 90, glucose 134, creatinine 0.7, normal LFTs, RSV PCR positive, troponin negative, BNP 304, C. difficile and GI pathogen panel pending.  CTA chest negative for PE, pulmonary edema, pleural effusions, or pneumonia.  Showing minimal bibasilar subsegmental atelectasis. Medications administered in the ED include albuterol  neb treatments, DuoNeb, Tessalon  capsule, IV Benadryl , Solu-Medrol  40 mg, oxycodone , and 500 mL normal saline.  Patient states she started feeling ill a week ago.  She is having cough, wheezing, and dyspnea on exertion.  Mostly dry cough.  States she was seen at urgent care and was  prescribed 2 different antibiotics and prednisone  but her symptoms have not improved.  She is using her home inhalers Advair and albuterol .  Denies chest pain.  Her husband has also sick with similar symptoms.  She is chronically on 3 L home oxygen  at night and during the day as needed.  After starting antibiotics, she started having diarrhea.  She is having right lower quadrant abdominal pain as well.  She had some nausea which has resolved, no vomiting.  Diarrhea is nonbloody.  Her oral intake has been poor.  She also reports generalized weakness and had a fall 2 days ago and reports hitting the back of her head against a wall.  Patient is concerned that she hit her head very hard.  Although she denies headaches or any neck pain.  She does take Eliquis  for A-fib.  Review of Systems:  Review of Systems  All other systems reviewed and are negative.   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   Benign paroxysmal positional vertigo due to bilateral vestibular disorder 05/20/2019   Bipolar 1 disorder (HCC) 01/23/2015   with GAD, benzo dependence   BPPV (benign paroxysmal positional vertigo) 05/20/2019   CAD (coronary artery disease)    Nonobstructive; Managed by Dr. Charls   Cardiomegaly 01/12/2018   CHF (congestive heart failure) 06/08/2022   Chronic back pain 01/04/2015   Chronic constipation 04/25/2020   Chronic diastolic heart failure (HCC) 05/30/2015  Chronic pain syndrome 08/22/2019   back pain, sacroiliitis   Chronic pain syndrome 08/22/2019   Chronic post-traumatic stress disorder (PTSD) 12/06/2020   Diabetic neuropathy (HCC)  02/06/2016   Dyslipidemia 09/24/2020   Dyspnea on exertion    Essential hypertension    Family history of coronary arteriosclerosis 05/30/2015   Functional diarrhea 10/26/2020   Head trauma 09/17/2020   Hemorrhoids 03/11/2022   Herpes genitalis in women 07/16/2015   History of adenomatous polyp of colon 05/21/2019   Overview:   03/31/17: Colonoscopy: nonadvanced adenoma, microscopic colitis, f/u 5 yrs, [facility]/GAP   History of radiation therapy    Endometrial- 02/18/23-03/31/23-Dr. Lynwood Nasuti   Insomnia 01/23/2015   Lipoma 02/08/2015   Migraine headache with aura 02/12/2016   Myofascial pain dysfunction syndrome 08/22/2019   Myofascial pain dysfunction syndrome 08/22/2019   Non-alcoholic fatty liver disease 01/12/2018   Opioid dependence (HCC) 12/06/2020   OSA (obstructive sleep apnea) 02/24/2019   10/09/2018 - HST  - AHI 40.6    Osteopenia 12/06/2020   Rx alendronate  35 mg.   Pinguecula 12/06/2020   Postcoital bleeding 12/06/2020   Presbyopia 12/06/2020   Pulmonary hypertension    RLS (restless legs syndrome) 04/27/2015   Superficial fungus infection of skin 09/03/2021   Tremor, essential 12/11/2021   Type II diabetes mellitus (HCC) 09/24/2020    Past Surgical History:  Procedure Laterality Date   BREAST REDUCTION SURGERY     EYE SURGERY Right    cateracts   HAMMER TOE SURGERY     LEFT HEART CATH AND CORONARY ANGIOGRAPHY N/A 02/03/2018   Procedure: LEFT HEART CATH AND CORONARY ANGIOGRAPHY;  Surgeon: Jordan, Peter M, MD;  Location: [facility] INVASIVE CV LAB;  Service: Cardiovascular;  Laterality: N/A;   REVERSE SHOULDER ARTHROPLASTY Right 08/19/2019   Procedure: REVERSE SHOULDER ARTHROPLASTY;  Surgeon: Kay Kemps, MD;  Location: [facility] ORS;  Service: Orthopedics;  Laterality: Right;  interscalene block   SHOULDER SURGERY Right    I BROKE MY SHOUDLER   THIGH SURGERY     TO REMOVE A TUMOR      reports that she has never smoked. She has never used smokeless tobacco. She reports  that she does not currently use alcohol . She reports that she does not use drugs.  Allergies  Allergen Reactions   Iodine Anaphylaxis   Ivp Dye [Iodinated Contrast Media] Anaphylaxis, Swelling and Other (See Comments)    Throat closes    Latex Other (See Comments)    Latex tape pulls skin with it   Tape Other (See Comments)    Pulls off the skin, if latex   Tizanidine  Other (See Comments)    Weakness, goofy, bad dreams    Family History  Problem Relation Age of Onset   Diabetes Mother    Heart disease Mother    Alzheimer's disease Father    Heart disease Sister        CABG   Diabetes Sister    Alcohol  abuse Sister    Stroke Brother    Heart disease Brother    Mental illness Brother    Diabetes Brother    Breast cancer Neg Hx    Ovarian cancer Neg Hx    Colon cancer Neg Hx    Endometrial cancer Neg Hx     Prior to Admission medications   Medication Sig Start Date End Date Taking? Authorizing Provider  clotrimazole  (MYCELEX ) 10 MG troche Take 10 mg by mouth 5 (five) times daily. 05/28/23  Yes [provider]  albuterol  (  VENTOLIN  HFA) 108 (90 Base) MCG/ACT inhaler Inhale 2 puffs into the lungs every 6 (six) hours as needed for wheezing or shortness of breath. 08/09/20   Landy Barnie RAMAN, NP  allopurinol  (ZYLOPRIM ) 100 MG tablet Take 100 mg by mouth daily. 10/22/22   [provider]  amoxicillin -clavulanate (AUGMENTIN ) 875-125 MG tablet Take 1 tablet by mouth every 12 (twelve) hours. 06/05/23   Dettinger, Fonda LABOR, MD  apixaban  (ELIQUIS ) 5 MG TABS tablet Take 1 tablet (5 mg total) by mouth 2 (two) times daily. 09/09/22   Shlomo Wilbert SAUNDERS, MD  ARIPiprazole  (ABILIFY ) 2 MG tablet Take 2 mg by mouth at bedtime. 10/07/21   [provider]  atorvastatin  (LIPITOR) 80 MG tablet Take 1 tablet (80 mg total) by mouth daily. 02/05/23 05/19/23  Lucien Orren SAILOR, PA-C  azithromycin  (ZITHROMAX ) 250 MG tablet Take (2) tablets by mouth on day 1, then take (1) tablet by mouth on  days 2-5. 06/08/23   Chandra Harlene LABOR, NP  benzonatate  (TESSALON ) 100 MG capsule Take 2 capsules (200 mg total) by mouth 3 (three) times daily as needed for cough. 06/08/23   Chandra Harlene LABOR, NP  cyanocobalamin  (VITAMIN B12) 1000 MCG tablet Take 1 tablet (1,000 mcg total) by mouth daily. 09/04/22   Ghimire, Donalda HERO, MD  denosumab  (PROLIA ) 60 MG/ML SOSY injection Inject 60 mg into the skin every 6 (six) months. 08/14/20   [name], MD  doxepin  (SINEQUAN ) 10 MG capsule Take 10 mg by mouth at bedtime. 11/27/22   [provider]  escitalopram  (LEXAPRO ) 10 MG tablet Take 10 mg by mouth daily.    [provider]  fluticasone -salmeterol (ADVAIR DISKUS) 100-50 MCG/ACT AEPB Inhale 1 puff into the lungs 2 (two) times daily. Patient taking differently: Inhale 1 puff into the lungs 2 (two) times daily as needed (for flares). 09/11/22   Parrett, Madelin RAMAN, NP  furosemide  (LASIX ) 20 MG tablet Take 20 mg by mouth daily.    [provider]  Galcanezumab -gnlm (EMGALITY ) 120 MG/ML SOAJ INJECT CONTENTS ON 1 PEN(120MG ) INTO THE SKIN ONCE MONTHLY Patient taking differently: Inject 120 mg into the skin every 30 (thirty) days. 01/23/23   Wertman, Sara E, PA-C  hydrochlorothiazide  (MICROZIDE ) 12.5 MG capsule Take 12.5 mg by mouth daily. 02/18/23   [provider]  hydrOXYzine  (VISTARIL ) 25 MG capsule Take 25 mg by mouth daily at 6 (six) AM. 04/10/23   [provider]  Insulin  Disposable Pump (OMNIPOD 5 G7 INTRO, GEN 5,) KIT 1 Device by Does not apply route every other day. 02/12/23   Shamleffer, Donell Cardinal, MD  Insulin  Disposable Pump (OMNIPOD 5 G7 PODS, GEN 5,) MISC 1 Device by Does not apply route every other day. 02/12/23   Shamleffer, Ibtehal Jaralla, MD  insulin  glargine (LANTUS  SOLOSTAR) 100 UNIT/ML Solostar Pen Inject 32 Units into the skin 2 (two) times daily. 05/22/23   Shamleffer, Ibtehal Jaralla, MD  insulin  lispro (HUMALOG  KWIKPEN) 100 UNIT/ML KwikPen Max daily 45  units 05/22/23   Shamleffer, Ibtehal Jaralla, MD  Insulin  Pen Needle 29G X MISC 1 Device by Does not apply route daily in the afternoon. 05/22/23   Shamleffer, Ibtehal Jaralla, MD  levocetirizine (XYZAL  ALLERGY 24HR) 5 MG tablet Take 1 tablet (5 mg total) by mouth every evening. For itch 05/12/23   [name], MD  lidocaine  (XYLOCAINE ) 2 % solution Use as directed 15 mLs in the mouth or throat every 4 (four) hours as needed for mouth pain. 05/18/23   Stacks,  Butler, MD  LORazepam  (ATIVAN ) 0.5 MG tablet Take 0.5-1 mg by mouth See admin instructions. Take 0.5 mg by mouth in the morning and 1 mg at bedtime    [provider]  losartan  (COZAAR ) 25 MG tablet Take 25 mg by mouth daily. 02/14/23   [provider]  meclizine  (ANTIVERT ) 25 MG tablet Take 1 tablet (25 mg total) by mouth 3 (three) times daily as needed for dizziness. 06/05/23   Dettinger, Fonda LABOR, MD  metFORMIN  (GLUCOPHAGE -XR) 500 MG 24 hr tablet Take 2 tablets (1,000 mg total) by mouth daily with breakfast. 05/22/23   Shamleffer, Donell Cardinal, MD  methocarbamol  1000 MG TABS Take 1,000 mg by mouth every 6 (six) hours as needed for muscle spasms. 02/16/23   Lovorn, Megan, MD  metoprolol  tartrate (LOPRESSOR ) 50 MG tablet Take 1 tablet (50 mg total) by mouth 2 (two) times daily. 01/06/23   Shlomo Wilbert SAUNDERS, MD  naloxone  (NARCAN ) nasal spray 4 mg/0.1 mL Place 1 spray into the nose once as needed (AS DIRECTED). 08/08/21   [provider]  nystatin  (MYCOSTATIN /NYSTOP ) powder Apply 1 Application topically as needed (rash). 06/16/22   Shamleffer, Ibtehal Jaralla, MD  nystatin  ointment (MYCOSTATIN ) Apply 1 Application topically 2 (two) times daily as needed (for rashes). 04/06/23   Signa Delon LABOR, NP  omeprazole  (PRILOSEC) 40 MG capsule Take 40 mg by mouth daily.    [provider]  ondansetron  (ZOFRAN ) 4 MG tablet Take 1 tablet (4 mg total) by mouth every 8 (eight) hours as needed for nausea or vomiting. For  cancer related nausea 02/16/23   Lovorn, Megan, MD  ondansetron  (ZOFRAN -ODT) 8 MG disintegrating tablet Take 1 tablet (8 mg total) by mouth every 6 (six) hours as needed for nausea or vomiting. 01/13/22   Zollie Butler, MD  OXcarbazepine  (TRILEPTAL ) 150 MG tablet Take 1 tablet (150 mg total) by mouth 2 (two) times daily. 06/13/22   Sherrill Cable Latif, DO  oxyCODONE  (OXYCONTIN ) 20 mg 12 hr tablet Take 1 tablet (20 mg total) by mouth every 12 (twelve) hours. To replace MS Contin - having itching as well as increased pain since cancer dx- morphine  frequently not as good for cancer pain 04/16/23   Lovorn, Megan, MD  oxyCODONE  (OXYCONTIN ) 20 mg 12 hr tablet Take 1 tablet (20 mg total) by mouth every 12 (twelve) hours. 06/12/23 to be filled 05/04/23   Lovorn, Megan, MD  Oxycodone  HCl 20 MG TABS Take 1 tablet by mouth 2 (two) times daily as needed. 04/16/23   [provider]  pantoprazole  (PROTONIX ) 40 MG tablet Take 40 mg by mouth daily as needed (for reflux).    [provider]  potassium chloride  (KLOR-CON ) 10 MEQ tablet Take 1 tablet (10 mEq total) by mouth daily. 02/05/23 05/14/23  Lucien Orren SAILOR, PA-C  predniSONE  (DELTASONE ) 20 MG tablet Take 2 tablets (40 mg total) by mouth daily with breakfast for 5 days. 06/08/23 06/13/23  Chandra Harlene LABOR, NP  pregabalin  (LYRICA ) 200 MG capsule Take 200 mg by mouth 2 (two) times daily.    [provider]  promethazine  (PHENERGAN ) 25 MG tablet Take 0.5-1 tablets (12.5-25 mg total) by mouth every 6 (six) hours as needed for refractory nausea / vomiting (cancer related nausea/vomiting). 02/16/23   Lovorn, Megan, MD  promethazine -dextromethorphan  (PROMETHAZINE -DM) 6.25-15 MG/5ML syrup Take 5 mLs by mouth 3 (three) times daily as needed for cough. 06/09/23   Lavell Bari LABOR, FNP  rOPINIRole  (REQUIP ) 1 MG tablet Take 1 tablet (1 mg total)  by mouth at bedtime. For leg cramps 05/12/23   [name], MD  senna-docusate (SENOKOT-S) 8.6-50 MG tablet Take 1  tablet by mouth at bedtime. Patient taking differently: Take 1 tablet by mouth at bedtime as needed (for constipation). 06/13/22   Sherrill Cable Latif, DO  spironolactone  (ALDACTONE ) 25 MG tablet Take 0.5 tablets (12.5 mg total) by mouth daily. 01/31/23 05/14/23  Briana Elgin LABOR, MD  tirzepatide  (MOUNJARO ) 7.5 MG/0.5ML Pen Inject 7.5 mg into the skin once a week. 05/22/23   Shamleffer, Donell Cardinal, MD  TYLENOL  500 MG tablet Take 500-1,000 mg by mouth every 6 (six) hours as needed for mild pain (pain score 1-3) or headache.    [provider]  Vitamin D , Cholecalciferol , 25 MCG (1000 UT) TABS Take 1,000 Units by mouth daily in the afternoon. 07/26/20   [provider]    Physical Exam: Vitals:   06/10/23 1845 06/10/23 1900 06/10/23 1926 06/10/23 2050  BP: (!) 146/79   (!) 140/75  Pulse: 97 85  96  Resp: 20 20  18   Temp:   98 F (36.7 C) 97.9 F (36.6 C)  TempSrc:   Oral Oral  SpO2: 97% 96%  100%  Weight:      Height:        Physical Exam Vitals reviewed.  Constitutional:      General: She is not in acute distress. HENT:     Head: Normocephalic and atraumatic.     Mouth/Throat:     Mouth: Mucous membranes are dry.  Eyes:     Extraocular Movements: Extraocular movements intact.  Cardiovascular:     Rate and Rhythm: Normal rate and regular rhythm.     Pulses: Normal pulses.  Pulmonary:     Effort: Pulmonary effort is normal. No respiratory distress.     Breath sounds: No wheezing.     Comments: Bibasilar crackles Abdominal:     General: Bowel sounds are normal. There is no distension.     Palpations: Abdomen is soft.     Tenderness: There is abdominal tenderness. There is guarding.     Comments: Right lower quadrant tender to palpation with guarding  Musculoskeletal:     Cervical back: Normal range of motion.     Right lower leg: No edema.     Left lower leg: No edema.  Skin:    General: Skin is warm and dry.  Neurological:     General: No focal  deficit present.     Mental Status: She is alert and oriented to person, place, and time.     Cranial Nerves: No cranial nerve deficit.     Sensory: No sensory deficit.     Motor: No weakness.     Labs on Admission: I have personally reviewed following labs and imaging studies  CBC: Recent Labs  Lab 06/10/23 0933  WBC 4.6  HGB 11.0*  HCT 31.6*  MCV 88.5  PLT 177   Basic Metabolic Panel: Recent Labs  Lab 06/10/23 0933  NA 123*  K 3.7  CL 90*  CO2 29  GLUCOSE 134*  BUN 11  CREATININE 0.74  CALCIUM  8.9   GFR: Estimated Creatinine Clearance: 59.2 mL/min (by C-G formula based on SCr of 0.74 mg/dL). Liver Function Tests: Recent Labs  Lab 06/10/23 0933  AST 24  ALT 14  ALKPHOS 81  BILITOT 0.4  PROT 6.8  ALBUMIN  3.9   No results for input(s): LIPASE, AMYLASE in the last 168 hours. No results for input(s): AMMONIA  in the last 168 hours. Coagulation Profile: No results for input(s): INR, PROTIME in the last 168 hours. Cardiac Enzymes: No results for input(s): CKTOTAL, CKMB, CKMBINDEX, TROPONINI in the last 168 hours. BNP (last 3 results) No results for input(s): PROBNP in the last 8760 hours. HbA1C: No results for input(s): HGBA1C in the last 72 hours. CBG: No results for input(s): GLUCAP in the last 168 hours. Lipid Profile: No results for input(s): CHOL, HDL, LDLCALC, TRIG, CHOLHDL, LDLDIRECT in the last 72 hours. Thyroid  Function Tests: No results for input(s): TSH, T4TOTAL, FREET4, T3FREE, THYROIDAB in the last 72 hours. Anemia Panel: No results for input(s): VITAMINB12, FOLATE, FERRITIN, TIBC, IRON, RETICCTPCT in the last 72 hours. Urine analysis:    Component Value Date/Time   COLORURINE YELLOW 05/14/2023 1125   APPEARANCEUR CLEAR 05/14/2023 1125   APPEARANCEUR Clear 04/21/2023 1428   LABSPEC 1.005 05/14/2023 1125   PHURINE 6.0 05/14/2023 1125   GLUCOSEU NEGATIVE 05/14/2023 1125   HGBUR  NEGATIVE 05/14/2023 1125   BILIRUBINUR NEGATIVE 05/14/2023 1125   BILIRUBINUR Negative 04/21/2023 1428   KETONESUR NEGATIVE 05/14/2023 1125   PROTEINUR NEGATIVE 05/14/2023 1125   UROBILINOGEN 0.2 07/23/2015 1350   NITRITE NEGATIVE 05/14/2023 1125   LEUKOCYTESUR NEGATIVE 05/14/2023 1125    Radiological Exams on Admission: CT Angio Chest PE W/Cm &/Or Wo Cm Result Date: 06/10/2023 CLINICAL DATA:  Shortness of breath, productive cough. EXAM: CT ANGIOGRAPHY CHEST WITH CONTRAST TECHNIQUE: Multidetector CT imaging of the chest was performed using the standard protocol during bolus administration of intravenous contrast. Multiplanar CT image reconstructions and MIPs were obtained to evaluate the vascular anatomy. RADIATION DOSE REDUCTION: This exam was performed according to the departmental dose-optimization program which includes automated exposure control, adjustment of the mA and/or kV according to patient size and/or use of iterative reconstruction technique. CONTRAST:  75mL OMNIPAQUE  IOHEXOL  350 MG/ML SOLN COMPARISON:  May 14, 2023. FINDINGS: Cardiovascular: Satisfactory opacification of the pulmonary arteries to the segmental level. No evidence of pulmonary embolism. Mild cardiomegaly. No pericardial effusion. Mediastinum/Nodes: No enlarged mediastinal, hilar, or axillary lymph nodes. Thyroid  gland, trachea, and esophagus demonstrate no significant findings. Lungs/Pleura: No pneumothorax or pleural effusion is noted. Minimal bibasilar subsegmental atelectasis is noted. Upper Abdomen: No acute abnormality. Musculoskeletal: Status post right shoulder arthroplasty. No acute osseous abnormality is noted. Review of the MIP images confirms the above findings. IMPRESSION: No definite evidence of pulmonary embolus. Minimal bibasilar subsegmental atelectasis. Electronically Signed   By: Lynwood Landy Raddle M.D.   On: 06/10/2023 15:33   DG Chest 2 View Result Date: 06/10/2023 CLINICAL DATA:  Shortness of breath.  EXAM: CHEST - 2 VIEW COMPARISON:  06/03/2023. FINDINGS: Low lung volume. Redemonstration of increased interstitial markings throughout bilateral lungs, slightly worsened, which may be accentuated by low lung volume. This is nonspecific and may represent underlying atypical pneumonia versus pulmonary edema. Apparent right superior hilar opacity described on the prior exam is unchanged. Bilateral lung fields are otherwise clear. Bilateral costophrenic angles are clear. Note is made of elevated right hemidiaphragm. Stable cardio-mediastinal silhouette. No acute osseous abnormalities. Right reverse shoulder arthroplasty again seen. The soft tissues are within normal limits. IMPRESSION: *Slight interval increase in the increased interstitial markings throughout bilateral lungs, likely accentuated by low lung volume. This is nonspecific and may represent underlying atypical pneumonia versus pulmonary edema. *Otherwise no acute consolidation or lung collapse. Electronically Signed   By: Ree Molt M.D.   On: 06/10/2023 10:02    EKG: Independently reviewed.  Rate controlled A-fib, occasional  PVCs.  Assessment and Plan  Viral URI/RSV positive Patient was recently treated with antibiotics due to concern for pneumonia.  However, CT done today is not suggestive of pneumonia.  No fever or leukocytosis.  She has history of asthma and was given Solu-Medrol  and bronchodilator treatments in the ED.  No wheezing appreciated on exam at this time and stable on 2 L [place] which is her baseline.  She uses Advair at home which is not on the hospital formulary.  Ordered Dulera .  Continue antitussive  as needed, albuterol  neb as needed.  Right lower quadrant abdominal pain and diarrhea Stat CT abdomen pelvis ordered.  C. difficile and GI pathogen panel pending, enteric precautions.  Acute on chronic hyponatremia In the setting of acute illness/poor p.o. intake and diarrhea.  Sodium currently 123 (was 127 on labs done almost 4  weeks ago and previously 130-135).  Continue gentle IV fluid hydration with normal saline and monitor sodium level every 6 hours.  Check serum osmolarity.  Goal rate of correction 4 to 6 mEq in 24 hours.  Generalized weakness/physical deconditioning Fall at home Patient reports reports head injury related to the fall.  No focal neurodeficit on exam.  Stat CT head ordered as she is on chronic anticoagulation.  Continue fall precautions, PT/OT eval.  CAD Troponin negative and EKG without acute ischemic changes.  Patient is not endorsing chest pain.  Chronic HFpEF Last echo done in August 2024 showing EF 60 to 65%, trivial MVR, mild AVR.  BNP 304 but no signs of volume overload.  Instead, she appears dehydrated and receiving gentle IV fluids.  Hold home diuretics at this time and monitor volume status closely.  A-fib Currently rate controlled.  Hold Eliquis  until CT scans of head and abdomen pelvis are done.  Insulin -dependent type 2 diabetes Well-controlled-last A1c 6.6 on 05/22/2023.  Placed on sensitive sliding scale insulin .  Dose of home long-acting insulin  unknown at this time, pharmacy med rec pending.  Chronic anemia Hemoglobin stable.  Peripheral neuropathy Hypertension: SBP currently in the 140s. Hyperlipidemia Bipolar disorder Chronic pain syndrome RLS GERD Pharmacy med rec pending.  DVT prophylaxis: SCDs Code Status: Full Code (discussed with the patient) Admission status: It is my clinical opinion that referral for OBSERVATION is reasonable and necessary in this patient based on the above information provided. The aforementioned taken together are felt to place the patient at high risk for further clinical deterioration. However, it is anticipated that the patient may be medically stable for discharge from the hospital within 24 to 48 hours.  Editha Ram MD [facility]  If 7PM-7AM, please contact night-coverage [email or web address]  06/10/2023, 9:08 PM

## 2023-06-10 NOTE — Telephone Encounter (Signed)
  Chief Complaint: SOB Symptoms: SOB at rest, wheezing, productive cough Frequency: Began 06/03/23 Pertinent Negatives: Patient denies fever Disposition: [x] ED /[] Urgent Care (no appt availability in office) / [] Appointment(In office/virtual)/ []  [facility]/ [] Home Care/ [] Refused Recommended Disposition /[] [facility]/ []  Follow-up with PCP Additional Notes: Patient calls stating she was diagnosed with pneumonia 06/03/23 at urgent care. States she has been on two antibiotics, using an inhaler, and nebulizer and was improving but woke up around 0200 feeling worse. Pt has audible wheezing during call, resting between sentences. States she has a productive cough with clear phlegm. Denies fever. Per protocol, pt to be evaluated in ED now. Care advice reviewed, pt verbalized understanding. Alerting PCP for review.   Copied from CRM [id number]. Topic: Clinical - Red Word Triage >> Jun 10, 2023  7:47 AM Karel PARAS wrote: Red Word that prompted transfer to Nurse Triage: a lot of fluid in chest and having trouble with breathing but managing it Reason for Disposition  MODERATE difficulty breathing (e.g., speaks in phrases, SOB even at rest, pulse 100-120)  Answer Assessment - Initial Assessment Questions 1. SYMPTOM: What's the main symptom you're concerned about? (e.g., breathing difficulty, fever, weakness)     SOB 2. ONSET: When did the  Symptoms  start?     06/03/23 3. BETTER-SAME-WORSE: Are you getting better, staying the same, or getting worse compared to the day you were discharged?     Was improving, began feeling worse around 0200 today 4. BREATHING DIFFICULTY: Are you having any difficulty breathing? If Yes, ask: How bad is it?  (e.g., none, mild, moderate, severe)   - MILD: No SOB at rest, mild SOB with walking, speaks normally in sentences, can lie down, no retractions, pulse < 100.    - MODERATE: SOB at rest, SOB with minimal exertion and prefers to sit, cannot lie  down flat, speaks in phrases, mild retractions, audible wheezing, pulse 100-120.    - SEVERE: Very SOB at rest, speaks in single words, struggling to breathe, sitting hunched forward, retractions, pulse > 120      Moderate 5. FEVER: Do you have a fever? If Yes, ask: What is your temperature, how was it measured, and when did it start?     Denies 6. SPUTUM: Describe the color of your sputum (clear, white, yellow, green, blood-tinged)     Clear 7. DIAGNOSIS CONFIRMATION: When was the pneumonia diagnosed? By whom?     06/03/23 8. ANTIBIOTIC: Are you taking an antibiotic?  If Yes, ask: Which one? When was it started?     Two antibiotics, amox/clav 875-125 1/2, azithromycin - started two to three days ago 9. OTHER TREATMENT: Are you receiving any other treatment for the pneumonia? (e.g., albuterol  nebulizer, oxygen ) If Yes, ask: How often? and Does it help?     Yes, did a neb two nights ago, used inhaler around 0300 today with minimal relief 10. HOSPITAL ADMISSION: Were you hospitalized for this pneumonia? If Yes, ask: When were you discharged home from the hospital?       Denies 49. O2 SATURATION MONITOR:  Do you use an oxygen  saturation monitor (pulse oximeter) at home? If Yes, What is your reading (oxygen  level) today? What is your usual oxygen  saturation reading? (e.g., 95%)       95%  Protocols used: Pneumonia Follow-up Call-[facility]

## 2023-06-10 NOTE — ED Notes (Signed)
 Report given to the next RN.Marland KitchenMarland Kitchen

## 2023-06-10 NOTE — ED Triage Notes (Signed)
 Pt was diagnosed with PNA 1/1 at UC, ABX started 1/3, states she is feeling worse and coughing with phlegm. N/D that started about a week ago. SOB this am, wears 2L/O2 at night and home nebs. None taken this am.

## 2023-06-10 NOTE — Progress Notes (Signed)
 Plan of Care Note for accepted transfer  Patient: Amber Stephenson    FMW:[id number]  DOA: 06/10/2023     Nursing staff, Please call [facility] Admits & Consults System-Wide number on Amion as soon as patient's arrival to the unit (not the listed attending) so that the appropriate admitting provider can evaluate the pt. ASAP to avoid any delay in care.  Facility requesting transfer: [facility] Requesting Provider: ED PA Wonda Simpers Reason for transfer: Hyponatremia Facility course:  78 year old with history of chronic diastolic CHF, A-fib on Eliquis , chronic respiratory failure with hypoxia 2 L nasal cannula, DM2, HTN, HLD, endometrial cancer, bipolar disorder started having URI symptoms about a week ago and went to urgent care where she was prescribed Augmentin .  Had persistent of symptoms therefore returned back to the ED and was prescribed prednisone  on 1/6.  Today she returns back again with nausea, diarrhea and coughing symptoms. In the ER she was positive for RSV, noted to be clinically dehydrated with hyponatremia sodium of 123.  Due to diarrhea, GI panel and C. difficile was ordered.  No neurologic compromise or symptoms at this time.  She will be admitted under observation to MedSurg for IV fluids and supportive care.  Plan of care: The patient is accepted for admission to Med-surg  unit, at [facility]    Author: Burgess JAYSON Dare, MD  06/10/2023  Check [email or web address] for on-call coverage.

## 2023-06-10 NOTE — ED Notes (Signed)
 RT Note: Patient was given an Albuterol inhaler with a spacer for home use.

## 2023-06-10 NOTE — ED Notes (Signed)
 Report given to [place]

## 2023-06-10 NOTE — ED Provider Notes (Signed)
  EMERGENCY DEPARTMENT AT [facility] Provider Note   CSN: [id number] Arrival date & time: 06/10/23  0900     History  Chief Complaint  Patient presents with   Cough   Nasal Congestion    Amber Stephenson is a 78 y.o. female.   Cough   78 year old female presents emergency department with complaints of cough, shortness of breath.  Reports being ill with symptoms on 06/02/2022.  Was seen in urgent care on the third of this month and prescribed Augmentin , prednisone , azithromycin  for treatment of right upper lobe pneumonia and wheezing.  States that she has been taking medication as prescribed without significant improvement of symptoms.  States that she woke up this morning and felt more dyspneic than normal prompting visit to the emergency department.  Patient states she is on 3 to 4 L at night and during the day as needed.  Denies any fever, chills, abdominal pain, nausea, vomiting, urinary symptoms, change in bowel habits.  States that her cough has not really improved since symptom onset.  States that she has had nausea as well as loose bowel movements over the past week that seem to have been improving with over-the-counter Imodium .  Was given DuoNeb prior to initial evaluation patient did note improvement of symptoms after it was administered.  Patient's not tried any nebulized therapy at home today.  Past medical history significant for CHF, chronic pain syndrome, OSA, A-fib on Eliquis , CAD, bipolar 1 disorder, hypertension, pulmonary hypertension, endometrial cancer receiving radiation but not on chemotherapy, diabetes mellitus type 2, chronic respiratory failure with hypoxia, restless leg syndrome  Home Medications Prior to Admission medications   Medication Sig Start Date End Date Taking? Authorizing Provider  albuterol  (VENTOLIN  HFA) 108 (90 Base) MCG/ACT inhaler Inhale 2 puffs into the lungs every 6 (six) hours as needed for wheezing or shortness of breath.  08/09/20  Yes Landy Barnie RAMAN, NP  allopurinol  (ZYLOPRIM ) 100 MG tablet Take 100 mg by mouth daily. 10/22/22  Yes [provider]  amoxicillin -clavulanate (AUGMENTIN ) 875-125 MG tablet Take 1 tablet by mouth every 12 (twelve) hours. 06/05/23  Yes Dettinger, Fonda LABOR, MD  apixaban  (ELIQUIS ) 5 MG TABS tablet Take 1 tablet (5 mg total) by mouth 2 (two) times daily. 09/09/22  Yes Turner, Wilbert SAUNDERS, MD  ARIPiprazole  (ABILIFY ) 2 MG tablet Take 2 mg by mouth at bedtime. 10/07/21  Yes [provider]  atorvastatin  (LIPITOR) 80 MG tablet Take 1 tablet (80 mg total) by mouth daily. 02/05/23 06/11/23 Yes Conte, Tessa N, PA-C  clotrimazole  (MYCELEX ) 10 MG troche Take 10 mg by mouth in the morning and at bedtime. 05/28/23  Yes [provider]  cyanocobalamin  (VITAMIN B12) 1000 MCG tablet Take 1 tablet (1,000 mcg total) by mouth daily. 09/04/22  Yes Ghimire, Donalda HERO, MD  denosumab  (PROLIA ) 60 MG/ML SOSY injection Inject 60 mg into the skin every 6 (six) months. 08/14/20  Yes Stacks, Butler, MD  escitalopram  (LEXAPRO ) 10 MG tablet Take 10 mg by mouth daily.   Yes [provider]  fluticasone -salmeterol (ADVAIR DISKUS) 100-50 MCG/ACT AEPB Inhale 1 puff into the lungs 2 (two) times daily. Patient taking differently: Inhale 1 puff into the lungs 2 (two) times daily as needed (for flares). 09/11/22  Yes Parrett, Tammy S, NP  furosemide  (LASIX ) 20 MG tablet Take 20 mg by mouth daily.   Yes [provider]  Galcanezumab -gnlm (EMGALITY ) 120 MG/ML SOAJ INJECT CONTENTS ON 1 PEN(120MG ) INTO THE SKIN ONCE MONTHLY Patient taking  differently: Inject 120 mg into the skin every 30 (thirty) days. 01/23/23  Yes Wertman, Sara E, PA-C  hydrochlorothiazide  (MICROZIDE ) 12.5 MG capsule Take 12.5 mg by mouth daily. 02/18/23  Yes [provider]  hydrOXYzine  (VISTARIL ) 25 MG capsule Take 25 mg by mouth daily at 6 (six) AM. 04/10/23  Yes [provider]  insulin  glargine (LANTUS  SOLOSTAR) 100  UNIT/ML Solostar Pen Inject 32 Units into the skin 2 (two) times daily. 05/22/23  Yes Shamleffer, Ibtehal Jaralla, MD  insulin  lispro (HUMALOG  KWIKPEN) 100 UNIT/ML KwikPen Max daily 45 units Patient taking differently: Inject 6-45 Units into the skin 3 (three) times daily. Max daily 45 units 05/22/23  Yes Shamleffer, Ibtehal Jaralla, MD  levocetirizine (XYZAL  ALLERGY 24HR) 5 MG tablet Take 1 tablet (5 mg total) by mouth every evening. For itch 05/12/23  Yes Stacks, Butler, MD  LORazepam  (ATIVAN ) 0.5 MG tablet Take 0.5-1 mg by mouth See admin instructions. Take 0.5 mg by mouth in the morning and 1 mg at bedtime   Yes [provider]  losartan  (COZAAR ) 25 MG tablet Take 25 mg by mouth daily. 02/14/23  Yes [provider]  meclizine  (ANTIVERT ) 25 MG tablet Take 1 tablet (25 mg total) by mouth 3 (three) times daily as needed for dizziness. 06/05/23  Yes Dettinger, Fonda LABOR, MD  metFORMIN  (GLUCOPHAGE -XR) 500 MG 24 hr tablet Take 2 tablets (1,000 mg total) by mouth daily with breakfast. 05/22/23  Yes Shamleffer, Ibtehal Jaralla, MD  methocarbamol  1000 MG TABS Take 1,000 mg by mouth every 6 (six) hours as needed for muscle spasms. 02/16/23  Yes Lovorn, Megan, MD  metoprolol  tartrate (LOPRESSOR ) 50 MG tablet Take 1 tablet (50 mg total) by mouth 2 (two) times daily. 01/06/23  Yes Turner, Wilbert SAUNDERS, MD  naloxone  (NARCAN ) nasal spray 4 mg/0.1 mL Place 1 spray into the nose once as needed (AS DIRECTED). 08/08/21  Yes [provider]  nystatin  (MYCOSTATIN /NYSTOP ) powder Apply 1 Application topically as needed (rash). 06/16/22  Yes Shamleffer, Ibtehal Jaralla, MD  nystatin  ointment (MYCOSTATIN ) Apply 1 Application topically 2 (two) times daily as needed (for rashes). 04/06/23  Yes Signa Delon LABOR, NP  omeprazole  (PRILOSEC) 40 MG capsule Take 40 mg by mouth daily.   Yes [provider]  ondansetron  (ZOFRAN ) 4 MG tablet Take 1 tablet (4 mg total) by mouth every 8 (eight) hours as needed for  nausea or vomiting. For cancer related nausea 02/16/23  Yes Lovorn, Megan, MD  OXcarbazepine  (TRILEPTAL ) 150 MG tablet Take 1 tablet (150 mg total) by mouth 2 (two) times daily. 06/13/22  Yes Sheikh, Omair Latif, DO  Oxycodone  HCl 20 MG TABS Take 1 tablet by mouth 2 (two) times daily as needed. 04/16/23  Yes [provider]  pantoprazole  (PROTONIX ) 40 MG tablet Take 40 mg by mouth daily as needed (for reflux).   Yes [provider]  potassium chloride  (KLOR-CON ) 10 MEQ tablet Take 1 tablet (10 mEq total) by mouth daily. 02/05/23 06/11/23 Yes Conte, Tessa N, PA-C  predniSONE  (DELTASONE ) 20 MG tablet Take 2 tablets (40 mg total) by mouth daily with breakfast for 5 days. 06/08/23 06/13/23 Yes Chandra Harlene LABOR, NP  pregabalin  (LYRICA ) 200 MG capsule Take 200 mg by mouth 2 (two) times daily.   Yes [provider]  promethazine  (PHENERGAN ) 25 MG tablet Take 0.5-1 tablets (12.5-25 mg total) by mouth every 6 (six) hours as needed for refractory nausea / vomiting (cancer related nausea/vomiting). 02/16/23  Yes Lovorn, Megan, MD  promethazine -dextromethorphan  (  PROMETHAZINE -DM) 6.25-15 MG/5ML syrup Take 5 mLs by mouth 3 (three) times daily as needed for cough. 06/09/23  Yes Hawks, Christy A, FNP  rOPINIRole  (REQUIP ) 1 MG tablet Take 1 tablet (1 mg total) by mouth at bedtime. For leg cramps 05/12/23  Yes Stacks, Butler, MD  senna-docusate (SENOKOT-S) 8.6-50 MG tablet Take 1 tablet by mouth at bedtime. Patient taking differently: Take 1 tablet by mouth at bedtime as needed (for constipation). 06/13/22  Yes Sheikh, Omair Latif, DO  spironolactone  (ALDACTONE ) 25 MG tablet Take 0.5 tablets (12.5 mg total) by mouth daily. 01/31/23 06/11/23 Yes Briana Elgin LABOR, MD  tirzepatide  (MOUNJARO ) 7.5 MG/0.5ML Pen Inject 7.5 mg into the skin once a week. 05/22/23  Yes Shamleffer, Ibtehal Jaralla, MD  TYLENOL  500 MG tablet Take 500-1,000 mg by mouth every 6 (six) hours as needed for mild pain (pain score 1-3) or  headache.   Yes [provider]  Vitamin D , Cholecalciferol , 25 MCG (1000 UT) TABS Take 1,000 Units by mouth daily in the afternoon. 07/26/20  Yes [provider]  azithromycin  (ZITHROMAX ) 250 MG tablet Take (2) tablets by mouth on day 1, then take (1) tablet by mouth on days 2-5. Patient not taking: Reported on 06/11/2023 06/08/23   Chandra Harlene LABOR, NP  benzonatate  (TESSALON ) 100 MG capsule Take 2 capsules (200 mg total) by mouth 3 (three) times daily as needed for cough. Patient not taking: Reported on 06/11/2023 06/08/23   Chandra Harlene LABOR, NP  doxepin  (SINEQUAN ) 10 MG capsule Take 10 mg by mouth at bedtime. Patient not taking: Reported on 06/11/2023 11/27/22   [provider]  Insulin  Disposable Pump (OMNIPOD 5 G7 INTRO, GEN 5,) KIT 1 Device by Does not apply route every other day. 02/12/23   Shamleffer, Ibtehal Jaralla, MD  Insulin  Disposable Pump (OMNIPOD 5 G7 PODS, GEN 5,) MISC 1 Device by Does not apply route every other day. 02/12/23   Shamleffer, Ibtehal Jaralla, MD  Insulin  Pen Needle 29G X MISC 1 Device by Does not apply route daily in the afternoon. 05/22/23   Shamleffer, Ibtehal Jaralla, MD  lidocaine  (XYLOCAINE ) 2 % solution Use as directed 15 mLs in the mouth or throat every 4 (four) hours as needed for mouth pain. Patient not taking: Reported on 06/11/2023 05/18/23   Zollie Butler, MD  ondansetron  (ZOFRAN -ODT) 8 MG disintegrating tablet Take 1 tablet (8 mg total) by mouth every 6 (six) hours as needed for nausea or vomiting. Patient not taking: Reported on 06/11/2023 01/13/22   Zollie Butler, MD  oxyCODONE  (OXYCONTIN ) 20 mg 12 hr tablet Take 1 tablet (20 mg total) by mouth every 12 (twelve) hours. To replace MS Contin - having itching as well as increased pain since cancer dx- morphine  frequently not as good for cancer pain Patient not taking: Reported on 06/11/2023 04/16/23   Lovorn, Megan, MD  oxyCODONE  (OXYCONTIN ) 20 mg 12 hr tablet Take 1 tablet (20 mg total)  by mouth every 12 (twelve) hours. 06/12/23 to be filled Patient not taking: Reported on 06/11/2023 05/04/23   Lovorn, Megan, MD      Allergies    Iodine, Ivp dye [iodinated contrast media], Latex, Tape, and Tizanidine     Review of Systems   Review of Systems  Respiratory:  Positive for cough.   All other systems reviewed and are negative.   Physical Exam Updated Vital Signs BP (!) 157/86 (BP Location: Left Arm)   Pulse 84   Temp 98.7 F (37.1 C) (Oral)   Resp 20  Ht 5' 2 (1.575 m)   Wt 81.6 kg   SpO2 95%   BMI 32.92 kg/m  Physical Exam Vitals and nursing note reviewed.  Constitutional:      General: She is not in acute distress.    Appearance: She is well-developed.  HENT:     Head: Normocephalic and atraumatic.  Eyes:     Conjunctiva/sclera: Conjunctivae normal.  Cardiovascular:     Rate and Rhythm: Normal rate and regular rhythm.  Pulmonary:     Effort: Pulmonary effort is normal. No respiratory distress.     Breath sounds: Wheezing and rales present.  Abdominal:     Palpations: Abdomen is soft.     Tenderness: There is no abdominal tenderness. There is no guarding.  Musculoskeletal:        General: No swelling.     Cervical back: Neck supple.     Right lower leg: No edema.     Left lower leg: No edema.  Skin:    General: Skin is warm and dry.     Capillary Refill: Capillary refill takes less than 2 seconds.  Neurological:     Mental Status: She is alert.  Psychiatric:        Mood and Affect: Mood normal.     ED Results / Procedures / Treatments   Labs (all labs ordered are listed, but only abnormal results are displayed) Labs Reviewed  RESP PANEL BY RT-PCR (RSV, FLU A&B, COVID)  RVPGX2 - Abnormal; Notable for the following components:      Result Value   Resp Syncytial Virus by PCR POSITIVE (*)    All other components within normal limits  CBC - Abnormal; Notable for the following components:   RBC 3.57 (*)    Hemoglobin 11.0 (*)    HCT 31.6 (*)     RDW 16.1 (*)    All other components within normal limits  COMPREHENSIVE METABOLIC PANEL - Abnormal; Notable for the following components:   Sodium 123 (*)    Chloride 90 (*)    Glucose, Bld 134 (*)    Anion gap 4 (*)    All other components within normal limits  BRAIN NATRIURETIC PEPTIDE - Abnormal; Notable for the following components:   B Natriuretic Peptide 304.2 (*)    All other components within normal limits  BASIC METABOLIC PANEL - Abnormal; Notable for the following components:   Sodium 132 (*)    CO2 20 (*)    Glucose, Bld 258 (*)    All other components within normal limits  OSMOLALITY - Abnormal; Notable for the following components:   Osmolality 301 (*)    All other components within normal limits  GLUCOSE, CAPILLARY - Abnormal; Notable for the following components:   Glucose-Capillary 253 (*)    All other components within normal limits  GLUCOSE, CAPILLARY - Abnormal; Notable for the following components:   Glucose-Capillary 192 (*)    All other components within normal limits  CBC - Abnormal; Notable for the following components:   RBC 3.49 (*)    Hemoglobin 10.6 (*)    HCT 32.5 (*)    RDW 16.7 (*)    All other components within normal limits  BASIC METABOLIC PANEL - Abnormal; Notable for the following components:   Sodium 134 (*)    Chloride 97 (*)    Glucose, Bld 163 (*)    Calcium  8.8 (*)    All other components within normal limits  GLUCOSE, CAPILLARY - Abnormal; Notable for  the following components:   Glucose-Capillary 163 (*)    All other components within normal limits  GLUCOSE, CAPILLARY - Abnormal; Notable for the following components:   Glucose-Capillary 232 (*)    All other components within normal limits  GLUCOSE, CAPILLARY - Abnormal; Notable for the following components:   Glucose-Capillary 327 (*)    All other components within normal limits  C DIFFICILE QUICK SCREEN W PCR REFLEX    GASTROINTESTINAL PANEL BY PCR, STOOL (REPLACES STOOL  CULTURE)  CBC  BASIC METABOLIC PANEL  TROPONIN I (HIGH SENSITIVITY)    EKG EKG Interpretation Date/Time:  Wednesday June 10 2023 09:39:09 EST Ventricular Rate:  80 PR Interval:    QRS Duration:  81 QT Interval:  366 QTC Calculation: 423 R Axis:   -16  Text Interpretation: Atrial fibrillation Ventricular premature complex Borderline left axis deviation Abnormal R-wave progression, early transition Nonspecific T abnrm, anterolateral leads Confirmed by Jerral Meth [phone]) on 06/11/2023 11:11:45 AM  Radiology CT HEAD WO CONTRAST ( ) Result Date: 06/11/2023 CLINICAL DATA:  [name] and hit head 2 days ago, also complains of right lower quadrant abdominal pain and diarrhea. CTA chest was performed earlier today. EXAM: CT HEAD WITHOUT CONTRAST CT ABDOMEN AND PELVIS WITHOUT CONTRAST TECHNIQUE: Contiguous axial images were obtained from the base of the skull through the vertex without intravenous contrast. Multiplanar CT image reconstructions were also generated. Multidetector CT imaging of the abdomen and pelvis was performed following the standard protocol without IV contrast. RADIATION DOSE REDUCTION: This exam was performed according to the departmental dose-optimization program which includes automated exposure control, adjustment of the mA and/or kV according to patient size and/or use of iterative reconstruction technique. COMPARISON:  CTA chest today, head CT 05/14/2023, CT chest, abdomen and pelvis without contrast 05/14/2023, CT abdomen and pelvis without contrast 01/13/2023. FINDINGS: CT HEAD FINDINGS Brain: No evidence of acute infarction, hemorrhage, hydrocephalus, extra-axial collection or mass lesion/mass effect. There is mild global atrophy, mild small-vessel disease of the cerebral white matter. No midline shift. Basal cisterns are clear. Vascular: There are scattered calcific plaques in the carotid siphons. No hyperdense central vessel is seen. Skull: Negative for fractures or focal  lesions. No visible scalp hematoma. Hyperostosis frontalis interna. Sinuses/Orbits: There is interval new finding of patchy opacification of the ethmoid sinus air cells, increased membrane thickening in the sphenoid and maxillary sinuses, increased membrane disease in the right frontal sinus, and a short fluid level in the left maxillary cavity. The mastoid air cells and middle ears are clear. Old lens replacements are again noted with otherwise negative orbits. Midline nasal septum. Other: None. CT ABDOMEN AND PELVIS FINDINGS Lower chest: Mild cardiomegaly. No pericardial effusion. The inferior mitral ring is heavily calcified. Lung bases show mild posterior atelectasis without infiltrates. Hepatobiliary: No focal liver abnormality is seen without contrast. The gallbladder and bile ducts are unremarkable. Pancreas: Partially atrophic.  Otherwise unremarkable contrast The greatest fatty infiltration is in the head and uncinate process. Spleen: No abnormality. Adrenals/Urinary Tract: Stable 1 cm left adrenal nodule including dating back to a scan from 12/13/2020, assumed benign at this point. No right adrenal lesion is seen. There is a 9 mm Bosniak 1 cyst of the lateral upper left kidney, Hounsfield density is 4.3. No follow-up imaging is recommended. No other focal abnormality of the unenhanced kidneys is seen. No collecting system stones, ureteral stones or hydronephrosis. Faint contrast from today's CTA is seen in the ureters, with contrast in the bladder. There is no bladder thickening. Stomach/Bowel:  No dilatation or wall thickening. Normal appendix. Moderate fecal stasis. Sigmoid diverticulosis without evidence of diverticulitis. Vascular/Lymphatic: Aortic atherosclerosis. No enlarged abdominal or pelvic lymph nodes. Multiple pelvic phleboliths. Reproductive: Uterus and bilateral adnexa are unremarkable. Other: Moderate-sized right inguinal fat hernia is noted chronically. There are small umbilical and left  inguinal fat hernias. There is no incarcerated hernia. There is no free fluid, free air, or free hemorrhage, no focal inflammatory process. Musculoskeletal: There is osteopenia. There is mild chronic anterior wedge deformity of the T9, T11, T12, and L2 vertebral bodies. Stable degenerative changes. No destructive bone lesions. IMPRESSION: 1. No acute intracranial CT findings or depressed skull fractures. 2. Atrophy and small-vessel disease. 3. Interval new finding of pansinusitis. 4. No acute noncontrast CT findings in the abdomen or pelvis. 5. Constipation and diverticulosis. 6. Aortic and mitral ring atherosclerosis. 7. Stable 1 cm left adrenal nodule, assumed benign at this point. 8. Osteopenia and degenerative change. 9. Chronic mild anterior wedge deformities of the T9, T11, T12, and L2 vertebral bodies. 10. Umbilical and inguinal fat hernias. Aortic Atherosclerosis (ICD10-I70.0). Electronically Signed   By: Francis Quam M.D.   On: 06/11/2023 00:25   CT ABDOMEN PELVIS WO CONTRAST Result Date: 06/11/2023 CLINICAL DATA:  [name] and hit head 2 days ago, also complains of right lower quadrant abdominal pain and diarrhea. CTA chest was performed earlier today. EXAM: CT HEAD WITHOUT CONTRAST CT ABDOMEN AND PELVIS WITHOUT CONTRAST TECHNIQUE: Contiguous axial images were obtained from the base of the skull through the vertex without intravenous contrast. Multiplanar CT image reconstructions were also generated. Multidetector CT imaging of the abdomen and pelvis was performed following the standard protocol without IV contrast. RADIATION DOSE REDUCTION: This exam was performed according to the departmental dose-optimization program which includes automated exposure control, adjustment of the mA and/or kV according to patient size and/or use of iterative reconstruction technique. COMPARISON:  CTA chest today, head CT 05/14/2023, CT chest, abdomen and pelvis without contrast 05/14/2023, CT abdomen and pelvis without  contrast 01/13/2023. FINDINGS: CT HEAD FINDINGS Brain: No evidence of acute infarction, hemorrhage, hydrocephalus, extra-axial collection or mass lesion/mass effect. There is mild global atrophy, mild small-vessel disease of the cerebral white matter. No midline shift. Basal cisterns are clear. Vascular: There are scattered calcific plaques in the carotid siphons. No hyperdense central vessel is seen. Skull: Negative for fractures or focal lesions. No visible scalp hematoma. Hyperostosis frontalis interna. Sinuses/Orbits: There is interval new finding of patchy opacification of the ethmoid sinus air cells, increased membrane thickening in the sphenoid and maxillary sinuses, increased membrane disease in the right frontal sinus, and a short fluid level in the left maxillary cavity. The mastoid air cells and middle ears are clear. Old lens replacements are again noted with otherwise negative orbits. Midline nasal septum. Other: None. CT ABDOMEN AND PELVIS FINDINGS Lower chest: Mild cardiomegaly. No pericardial effusion. The inferior mitral ring is heavily calcified. Lung bases show mild posterior atelectasis without infiltrates. Hepatobiliary: No focal liver abnormality is seen without contrast. The gallbladder and bile ducts are unremarkable. Pancreas: Partially atrophic.  Otherwise unremarkable contrast The greatest fatty infiltration is in the head and uncinate process. Spleen: No abnormality. Adrenals/Urinary Tract: Stable 1 cm left adrenal nodule including dating back to a scan from 12/13/2020, assumed benign at this point. No right adrenal lesion is seen. There is a 9 mm Bosniak 1 cyst of the lateral upper left kidney, Hounsfield density is 4.3. No follow-up imaging is recommended. No other focal abnormality of the  unenhanced kidneys is seen. No collecting system stones, ureteral stones or hydronephrosis. Faint contrast from today's CTA is seen in the ureters, with contrast in the bladder. There is no bladder  thickening. Stomach/Bowel: No dilatation or wall thickening. Normal appendix. Moderate fecal stasis. Sigmoid diverticulosis without evidence of diverticulitis. Vascular/Lymphatic: Aortic atherosclerosis. No enlarged abdominal or pelvic lymph nodes. Multiple pelvic phleboliths. Reproductive: Uterus and bilateral adnexa are unremarkable. Other: Moderate-sized right inguinal fat hernia is noted chronically. There are small umbilical and left inguinal fat hernias. There is no incarcerated hernia. There is no free fluid, free air, or free hemorrhage, no focal inflammatory process. Musculoskeletal: There is osteopenia. There is mild chronic anterior wedge deformity of the T9, T11, T12, and L2 vertebral bodies. Stable degenerative changes. No destructive bone lesions. IMPRESSION: 1. No acute intracranial CT findings or depressed skull fractures. 2. Atrophy and small-vessel disease. 3. Interval new finding of pansinusitis. 4. No acute noncontrast CT findings in the abdomen or pelvis. 5. Constipation and diverticulosis. 6. Aortic and mitral ring atherosclerosis. 7. Stable 1 cm left adrenal nodule, assumed benign at this point. 8. Osteopenia and degenerative change. 9. Chronic mild anterior wedge deformities of the T9, T11, T12, and L2 vertebral bodies. 10. Umbilical and inguinal fat hernias. Aortic Atherosclerosis (ICD10-I70.0). Electronically Signed   By: Francis Quam M.D.   On: 06/11/2023 00:25   CT Angio Chest PE W/Cm &/Or Wo Cm Result Date: 06/10/2023 CLINICAL DATA:  Shortness of breath, productive cough. EXAM: CT ANGIOGRAPHY CHEST WITH CONTRAST TECHNIQUE: Multidetector CT imaging of the chest was performed using the standard protocol during bolus administration of intravenous contrast. Multiplanar CT image reconstructions and MIPs were obtained to evaluate the vascular anatomy. RADIATION DOSE REDUCTION: This exam was performed according to the departmental dose-optimization program which includes automated exposure  control, adjustment of the mA and/or kV according to patient size and/or use of iterative reconstruction technique. CONTRAST:  75mL OMNIPAQUE  IOHEXOL  350 MG/ML SOLN COMPARISON:  May 14, 2023. FINDINGS: Cardiovascular: Satisfactory opacification of the pulmonary arteries to the segmental level. No evidence of pulmonary embolism. Mild cardiomegaly. No pericardial effusion. Mediastinum/Nodes: No enlarged mediastinal, hilar, or axillary lymph nodes. Thyroid  gland, trachea, and esophagus demonstrate no significant findings. Lungs/Pleura: No pneumothorax or pleural effusion is noted. Minimal bibasilar subsegmental atelectasis is noted. Upper Abdomen: No acute abnormality. Musculoskeletal: Status post right shoulder arthroplasty. No acute osseous abnormality is noted. Review of the MIP images confirms the above findings. IMPRESSION: No definite evidence of pulmonary embolus. Minimal bibasilar subsegmental atelectasis. Electronically Signed   By: Lynwood Landy Raddle M.D.   On: 06/10/2023 15:33   DG Chest 2 View Result Date: 06/10/2023 CLINICAL DATA:  Shortness of breath. EXAM: CHEST - 2 VIEW COMPARISON:  06/03/2023. FINDINGS: Low lung volume. Redemonstration of increased interstitial markings throughout bilateral lungs, slightly worsened, which may be accentuated by low lung volume. This is nonspecific and may represent underlying atypical pneumonia versus pulmonary edema. Apparent right superior hilar opacity described on the prior exam is unchanged. Bilateral lung fields are otherwise clear. Bilateral costophrenic angles are clear. Note is made of elevated right hemidiaphragm. Stable cardio-mediastinal silhouette. No acute osseous abnormalities. Right reverse shoulder arthroplasty again seen. The soft tissues are within normal limits. IMPRESSION: *Slight interval increase in the increased interstitial markings throughout bilateral lungs, likely accentuated by low lung volume. This is nonspecific and may represent  underlying atypical pneumonia versus pulmonary edema. *Otherwise no acute consolidation or lung collapse. Electronically Signed   By: Ree Kimberlee HERO.D.  On: 06/10/2023 10:02    Procedures Procedures    Medications Ordered in ED Medications  albuterol  (PROVENTIL ) (2.5 MG/3ML) 0.083% nebulizer solution (0 mg  Hold 06/10/23 2010)  acetaminophen  (TYLENOL ) tablet 650 mg (has no administration in time range)    Or  acetaminophen  (TYLENOL ) suppository 650 mg (has no administration in time range)  ipratropium-albuterol  (DUONEB) 0.5-2.5 (3) MG/3ML nebulizer solution 3 mL (3 mLs Nebulization Given 06/11/23 1353)  budesonide  (PULMICORT ) nebulizer solution 0.25 mg (0.25 mg Nebulization Given 06/11/23 1018)  arformoterol  (BROVANA ) nebulizer solution 15 mcg (15 mcg Nebulization Given 06/11/23 1018)  amoxicillin -clavulanate (AUGMENTIN ) 875-125 MG per tablet 1 tablet (1 tablet Oral Given 06/11/23 0936)  methylPREDNISolone  sodium succinate  (SOLU-MEDROL ) 40 mg/mL injection 40 mg (40 mg Intravenous Given 06/11/23 0937)  benzonatate  (TESSALON ) capsule 200 mg (200 mg Oral Not Given 06/11/23 1702)  menthol -cetylpyridinium (CEPACOL) lozenge 3 mg (has no administration in time range)  guaiFENesin -dextromethorphan  (ROBITUSSIN DM) 100-10 MG/5ML syrup 5 mL (has no administration in time range)  fluticasone  (FLONASE ) 50 MCG/ACT nasal spray 2 spray (2 sprays Each Nare Given 06/11/23 1023)  insulin  aspart (novoLOG ) injection 0-5 Units (has no administration in time range)  insulin  aspart (novoLOG ) injection 0-15 Units (11 Units Subcutaneous Given 06/11/23 1703)  insulin  glargine-yfgn (SEMGLEE ) injection 30 Units (30 Units Subcutaneous Given 06/11/23 1023)  pantoprazole  (PROTONIX ) EC tablet 40 mg (40 mg Oral Not Given 06/11/23 1034)  metoprolol  tartrate (LOPRESSOR ) tablet 50 mg (50 mg Oral Given 06/11/23 0936)  labetalol  (NORMODYNE ) injection 10 mg (has no administration in time range)  ondansetron  (ZOFRAN ) injection 4 mg (has no  administration in time range)  oxyCODONE  (Oxy IR/ROXICODONE ) immediate release tablet 5 mg (5 mg Oral Given 06/11/23 1020)  loratadine  (CLARITIN ) tablet 10 mg (10 mg Oral Given 06/11/23 1210)  loperamide  (IMODIUM ) capsule 2 mg (has no administration in time range)  apixaban  (ELIQUIS ) tablet 5 mg (5 mg Oral Given 06/11/23 1210)  cyanocobalamin  (VITAMIN B12) tablet 1,000 mcg (1,000 mcg Oral Given 06/11/23 1210)  escitalopram  (LEXAPRO ) tablet 10 mg (10 mg Oral Given 06/11/23 1210)  hydrOXYzine  (ATARAX ) tablet 25 mg (has no administration in time range)  methocarbamol  (ROBAXIN ) tablet 1,000 mg (has no administration in time range)  OXcarbazepine  (TRILEPTAL ) tablet 150 mg (150 mg Oral Given 06/11/23 1210)  pregabalin  (LYRICA ) capsule 200 mg (200 mg Oral Given 06/11/23 1210)  rOPINIRole  (REQUIP ) tablet 1 mg (has no administration in time range)  atorvastatin  (LIPITOR) tablet 80 mg (80 mg Oral Given 06/11/23 1210)  ARIPiprazole  (ABILIFY ) tablet 2 mg (has no administration in time range)  LORazepam  (ATIVAN ) tablet 1 mg (1 mg Oral Not Given 06/11/23 1702)  oxyCODONE  (OXYCONTIN ) 12 hr tablet 10 mg (10 mg Oral Not Given 06/11/23 1702)  ipratropium-albuterol  (DUONEB) 0.5-2.5 (3) MG/3ML nebulizer solution 3 mL (3 mLs Nebulization Given 06/10/23 0945)  methylPREDNISolone  sodium succinate  (SOLU-MEDROL ) 40 mg/mL injection 40 mg (40 mg Intravenous Given 06/10/23 1040)  diphenhydrAMINE  (BENADRYL ) capsule 50 mg ( Oral See Alternative 06/10/23 1327)    Or  diphenhydrAMINE  (BENADRYL ) injection 50 mg (50 mg Intravenous Given 06/10/23 1327)  albuterol  (PROVENTIL ) (2.5 MG/3ML) 0.083% nebulizer solution 2.5 mg (2.5 mg Nebulization Given 06/10/23 1045)  AeroChamber Plus Flo-Vu Small device MISC 1 each (1 each Other Provided for home use 06/10/23 1045)  oxyCODONE  (Oxy IR/ROXICODONE ) immediate release tablet 20 mg (20 mg Oral Given 06/10/23 1143)  benzonatate  (TESSALON ) capsule 200 mg (200 mg Oral Given 06/10/23 1521)  iohexol  (OMNIPAQUE ) 350 MG/ML  injection 100 mL (75 mLs Intravenous Contrast  Given 06/10/23 1424)  sodium chloride  0.9 % bolus 500 mL (0 mLs Intravenous Stopped 06/10/23 1543)  albuterol  (PROVENTIL ) (2.5 MG/3ML) 0.083% nebulizer solution 2.5 mg (2.5 mg Nebulization Given 06/10/23 1535)  LORazepam  (ATIVAN ) tablet 1 mg (1 mg Oral Given 06/11/23 0201)  loperamide  (IMODIUM ) capsule 4 mg (4 mg Oral Given 06/11/23 0936)    ED Course/ Medical Decision Making/ A&P Clinical Course as of 06/11/23 1725  Wed Jun 10, 2023  1812 Consulted hospitalist Dr. Caleen regarding the patient who agreed with admission. [CR]    Clinical Course User Index [CR] Silver Wonda LABOR, PA                                 Medical Decision Making Amount and/or Complexity of Data Reviewed Labs: ordered. Radiology: ordered.  Risk Prescription drug management. Decision regarding hospitalization.   This patient presents to the ED for concern of cough, shortness of breath, this involves an extensive number of treatment options, and is a complaint that carries with it a high risk of complications and morbidity.  The differential diagnosis includes asthma, COPD, CHF, CAD, PE, pneumonia, viral URI, pneumothorax, pericarditis/myocarditis/tamponade, other   Co morbidities that complicate the patient evaluation  See HPI   Additional history obtained:  Additional history obtained from EMR External records from outside source obtained and reviewed including hospital records   Lab Tests:  I Ordered, and personally interpreted labs.  The pertinent results include: Hyponatremia with a sodium of 123.  Hypochloremia of 90.  No transaminitis.  No renal dysfunction.  No leukocytosis.  Anemia normocytic with a hemoglobin of 11.  Placed within range.  BMP slightly elevated at 304.  RSV positive.  Troponin of 14   Imaging Studies ordered:  I ordered imaging studies including chest x-ray, CT angio chest PE I independently visualized and interpreted imaging which showed   Chest x-ray: Increased interval interstitial markings throughout bilateral lungs. CT angio chest PE: Negative.  Mild bibasilar subsegmental atelectasis I agree with the radiologist interpretation   Cardiac Monitoring: / EKG:  The patient was maintained on a cardiac monitor.  I personally viewed and interpreted the cardiac monitored which showed an underlying rhythm of: A-fib with PVC   Consultations Obtained:  See ED course  Problem List / ED Course / Critical interventions / Medication management  Hyponatremia I ordered medication including albuterol , DuoNeb, Solu-Medrol , Benadryl , oxycodone , benzonatate    Reevaluation of the patient after these medicines showed that the patient improved I have reviewed the patients home medicines and have made adjustments as needed   Social Determinants of Health:  Denies tobacco, illicit drug use   Test / Admission - Considered:  Hyponatremia Vitals signs significant for hypertension blood pressure 145/93. Otherwise within normal range and stable throughout visit. Laboratory/imaging studies significant for: See above 78 year old female presents emergency department with cough and shortness of breath.  Patient ill symptoms since 06/02/2022 and has been treated with Augmentin , prednisone , azithromycin  for right upper lobe pneumonia as well as wheezing in the outpatient setting.  Reports worsening of breathing since yesterday prompting visit to the emergency department.  Patient is on 3 to 4 L nasal cannula at baseline.  On exam, diffuse wheezing appreciated.  Workup from respiratory spectated overall reassuring.  No significant leukocytosis.  BNP mildly elevated without evidence of volume overload, pulmonary vas congestion/pleural effusion; low suspicion for CHF.  Imaging of chest significant for basilar subsegmental atelectasis but without  obvious pneumonia, PE, pneumothorax or other acute cardiopulmonary abnormality.  Suspect patient's  respiratory symptoms likely secondary to acute RSV infection on top of chronic respiratory conditions.  Patient also with 1 weeks worth of loose bowel movements.  Patient with worsening of baseline hyponatremia with a sodium of 123 most likely from GI source; patient also on Lasix  which could have exacerbated hyponatremia in the setting of dehydration as she is continued to take them despite GI loss.  Note neurologic symptoms accompanying hyponatremia treated with normal saline while in the ED for gentle volume resuscitation.  Will consult hospitalist for admission regarding patient's hyponatremia.  Stool studies pending.          Final Clinical Impression(s) / ED Diagnoses Final diagnoses:  Hyponatremia    Rx / DC Orders ED Discharge Orders     None         Silver Wonda LABOR, [place] 06/11/23 1725    Lenor Hollering, MD 06/15/23 1332

## 2023-06-11 DIAGNOSIS — G2581 Restless legs syndrome: Secondary | ICD-10-CM | POA: Diagnosis present

## 2023-06-11 DIAGNOSIS — J9611 Chronic respiratory failure with hypoxia: Secondary | ICD-10-CM | POA: Diagnosis present

## 2023-06-11 DIAGNOSIS — I4891 Unspecified atrial fibrillation: Secondary | ICD-10-CM | POA: Diagnosis present

## 2023-06-11 DIAGNOSIS — Z6832 Body mass index (BMI) 32.0-32.9, adult: Secondary | ICD-10-CM | POA: Diagnosis not present

## 2023-06-11 DIAGNOSIS — F319 Bipolar disorder, unspecified: Secondary | ICD-10-CM | POA: Diagnosis present

## 2023-06-11 DIAGNOSIS — Z7984 Long term (current) use of oral hypoglycemic drugs: Secondary | ICD-10-CM | POA: Diagnosis not present

## 2023-06-11 DIAGNOSIS — J45901 Unspecified asthma with (acute) exacerbation: Secondary | ICD-10-CM | POA: Diagnosis present

## 2023-06-11 DIAGNOSIS — R1010 Upper abdominal pain, unspecified: Secondary | ICD-10-CM | POA: Diagnosis not present

## 2023-06-11 DIAGNOSIS — E1142 Type 2 diabetes mellitus with diabetic polyneuropathy: Secondary | ICD-10-CM | POA: Diagnosis present

## 2023-06-11 DIAGNOSIS — R109 Unspecified abdominal pain: Secondary | ICD-10-CM | POA: Diagnosis not present

## 2023-06-11 DIAGNOSIS — Y92009 Unspecified place in unspecified non-institutional (private) residence as the place of occurrence of the external cause: Secondary | ICD-10-CM | POA: Diagnosis not present

## 2023-06-11 DIAGNOSIS — E871 Hypo-osmolality and hyponatremia: Secondary | ICD-10-CM | POA: Diagnosis present

## 2023-06-11 DIAGNOSIS — Z8542 Personal history of malignant neoplasm of other parts of uterus: Secondary | ICD-10-CM | POA: Diagnosis not present

## 2023-06-11 DIAGNOSIS — R531 Weakness: Secondary | ICD-10-CM | POA: Diagnosis not present

## 2023-06-11 DIAGNOSIS — B338 Other specified viral diseases: Secondary | ICD-10-CM | POA: Diagnosis not present

## 2023-06-11 DIAGNOSIS — E860 Dehydration: Secondary | ICD-10-CM | POA: Diagnosis present

## 2023-06-11 DIAGNOSIS — B974 Respiratory syncytial virus as the cause of diseases classified elsewhere: Secondary | ICD-10-CM | POA: Diagnosis present

## 2023-06-11 DIAGNOSIS — I272 Pulmonary hypertension, unspecified: Secondary | ICD-10-CM | POA: Diagnosis present

## 2023-06-11 DIAGNOSIS — G894 Chronic pain syndrome: Secondary | ICD-10-CM | POA: Diagnosis present

## 2023-06-11 DIAGNOSIS — Z1152 Encounter for screening for COVID-19: Secondary | ICD-10-CM | POA: Diagnosis not present

## 2023-06-11 DIAGNOSIS — Z794 Long term (current) use of insulin: Secondary | ICD-10-CM | POA: Diagnosis not present

## 2023-06-11 DIAGNOSIS — D649 Anemia, unspecified: Secondary | ICD-10-CM | POA: Diagnosis present

## 2023-06-11 DIAGNOSIS — I2510 Atherosclerotic heart disease of native coronary artery without angina pectoris: Secondary | ICD-10-CM

## 2023-06-11 DIAGNOSIS — Z923 Personal history of irradiation: Secondary | ICD-10-CM | POA: Diagnosis not present

## 2023-06-11 DIAGNOSIS — Z9981 Dependence on supplemental oxygen: Secondary | ICD-10-CM | POA: Diagnosis not present

## 2023-06-11 DIAGNOSIS — E785 Hyperlipidemia, unspecified: Secondary | ICD-10-CM | POA: Diagnosis present

## 2023-06-11 DIAGNOSIS — I5032 Chronic diastolic (congestive) heart failure: Secondary | ICD-10-CM | POA: Diagnosis present

## 2023-06-11 DIAGNOSIS — W19XXXA Unspecified fall, initial encounter: Secondary | ICD-10-CM | POA: Diagnosis present

## 2023-06-11 DIAGNOSIS — I110 Hypertensive heart disease with heart failure: Secondary | ICD-10-CM | POA: Diagnosis present

## 2023-06-11 DIAGNOSIS — K219 Gastro-esophageal reflux disease without esophagitis: Secondary | ICD-10-CM | POA: Diagnosis present

## 2023-06-11 LAB — BASIC METABOLIC PANEL
Anion gap: 9 (ref 5–15)
BUN: 11 mg/dL (ref 8–23)
CO2: 28 mmol/L (ref 22–32)
Calcium: 8.8 mg/dL — ABNORMAL LOW (ref 8.9–10.3)
Chloride: 97 mmol/L — ABNORMAL LOW (ref 98–111)
Creatinine, Ser: 0.6 mg/dL (ref 0.44–1.00)
GFR, Estimated: >60 mL/min (ref >60)
Glucose, Bld: 163 mg/dL — ABNORMAL HIGH (ref 70–99)
Potassium: 3.8 mmol/L (ref 3.5–5.1)
Sodium: 134 mmol/L — ABNORMAL LOW (ref 135–145)

## 2023-06-11 LAB — GASTROINTESTINAL PANEL BY PCR, STOOL (REPLACES STOOL CULTURE)

## 2023-06-11 LAB — CBC
HCT: 32.5 % — ABNORMAL LOW (ref 36.0–46.0)
Hemoglobin: 10.6 g/dL — ABNORMAL LOW (ref 12.0–15.0)
MCH: 30.4 pg (ref 26.0–34.0)
MCHC: 32.6 g/dL (ref 30.0–36.0)
MCV: 93.1 fL (ref 80.0–100.0)
Platelets: 180 10*3/uL (ref 150–400)
RBC: 3.49 MIL/uL — ABNORMAL LOW (ref 3.87–5.11)
RDW: 16.7 % — ABNORMAL HIGH (ref 11.5–15.5)
WBC: 5.9 10*3/uL (ref 4.0–10.5)
nRBC: 0 % (ref 0.0–0.2)

## 2023-06-11 LAB — GLUCOSE, CAPILLARY
Glucose-Capillary: 192 mg/dL — ABNORMAL HIGH (ref 70–99)
Glucose-Capillary: 163 mg/dL — ABNORMAL HIGH (ref 70–99)
Glucose-Capillary: 232 mg/dL — ABNORMAL HIGH (ref 70–99)
Glucose-Capillary: 327 mg/dL — ABNORMAL HIGH (ref 70–99)
Glucose-Capillary: 338 mg/dL — ABNORMAL HIGH (ref 70–99)

## 2023-06-11 LAB — OSMOLALITY: Osmolality: 301 mosm/kg — ABNORMAL HIGH (ref 275–295)

## 2023-06-11 MED ORDER — METHOCARBAMOL 500 MG PO TABS
1000.0000 mg | ORAL_TABLET | Freq: Four times a day (QID) | ORAL | Status: DC | PRN
  Administered 2023-06-12: 1000 mg via ORAL
  Filled 2023-06-11 (×3): qty 2

## 2023-06-11 MED ORDER — BENZONATATE 100 MG PO CAPS
200.0000 mg | ORAL_CAPSULE | Freq: Three times a day (TID) | ORAL | Status: DC
  Administered 2023-06-11 – 2023-06-12 (×3): 200 mg via ORAL
  Filled 2023-06-11 (×4): qty 2

## 2023-06-11 MED ORDER — LORAZEPAM 1 MG PO TABS
1.0000 mg | ORAL_TABLET | Freq: Once | ORAL | Status: AC
  Administered 2023-06-11: 1 mg via ORAL
  Filled 2023-06-11: qty 1

## 2023-06-11 MED ORDER — ATORVASTATIN CALCIUM 40 MG PO TABS
80.0000 mg | ORAL_TABLET | Freq: Every day | ORAL | Status: DC
  Administered 2023-06-11 – 2023-06-12 (×2): 80 mg via ORAL
  Filled 2023-06-11 (×2): qty 2

## 2023-06-11 MED ORDER — MENTHOL 3 MG MT LOZG: 1.0000 | LOZENGE | OROMUCOSAL | Status: DC | PRN

## 2023-06-11 MED ORDER — OXCARBAZEPINE 150 MG PO TABS
150.0000 mg | ORAL_TABLET | Freq: Two times a day (BID) | ORAL | Status: DC
  Administered 2023-06-11 – 2023-06-12 (×3): 150 mg via ORAL
  Filled 2023-06-11 (×3): qty 1

## 2023-06-11 MED ORDER — ROPINIROLE HCL 1 MG PO TABS
1.0000 mg | ORAL_TABLET | Freq: Every day | ORAL | Status: DC
  Administered 2023-06-11: 1 mg via ORAL
  Filled 2023-06-11: qty 1

## 2023-06-11 MED ORDER — OXYCODONE HCL ER 20 MG PO T12A: 20.0000 mg | EXTENDED_RELEASE_TABLET | Freq: Two times a day (BID) | ORAL | Status: DC

## 2023-06-11 MED ORDER — HYDROXYZINE HCL 25 MG PO TABS
25.0000 mg | ORAL_TABLET | Freq: Every day | ORAL | Status: DC
  Administered 2023-06-12: 25 mg via ORAL
  Filled 2023-06-11: qty 1

## 2023-06-11 MED ORDER — METOPROLOL TARTRATE 50 MG PO TABS
50.0000 mg | ORAL_TABLET | Freq: Two times a day (BID) | ORAL | Status: DC
  Administered 2023-06-11 – 2023-06-12 (×3): 50 mg via ORAL
  Filled 2023-06-11 (×3): qty 1

## 2023-06-11 MED ORDER — LORAZEPAM 0.5 MG PO TABS
0.5000 mg | ORAL_TABLET | ORAL | Status: DC
Start: 2023-06-11 — End: 2023-06-11

## 2023-06-11 MED ORDER — LORAZEPAM 2 MG/ML IJ SOLN
0.5000 mg | Freq: Once | INTRAMUSCULAR | Status: DC
  Filled 2023-06-11: qty 1

## 2023-06-11 MED ORDER — PREGABALIN 100 MG PO CAPS
200.0000 mg | ORAL_CAPSULE | Freq: Two times a day (BID) | ORAL | Status: DC
  Administered 2023-06-11 – 2023-06-12 (×3): 200 mg via ORAL
  Filled 2023-06-11 (×3): qty 2

## 2023-06-11 MED ORDER — ONDANSETRON HCL 4 MG/2ML IJ SOLN: 4.0000 mg | Freq: Four times a day (QID) | INTRAMUSCULAR | Status: DC | PRN

## 2023-06-11 MED ORDER — LABETALOL HCL 5 MG/ML IV SOLN: 10.0000 mg | INTRAVENOUS | Status: DC | PRN

## 2023-06-11 MED ORDER — LORATADINE 10 MG PO TABS
10.0000 mg | ORAL_TABLET | Freq: Every day | ORAL | Status: DC
  Administered 2023-06-11 – 2023-06-12 (×2): 10 mg via ORAL
  Filled 2023-06-11 (×2): qty 1

## 2023-06-11 MED ORDER — OXYCODONE HCL ER 10 MG PO T12A
10.0000 mg | EXTENDED_RELEASE_TABLET | Freq: Two times a day (BID) | ORAL | Status: DC
  Administered 2023-06-11 – 2023-06-12 (×2): 10 mg via ORAL
  Filled 2023-06-11 (×3): qty 1

## 2023-06-11 MED ORDER — ARIPIPRAZOLE 2 MG PO TABS
2.0000 mg | ORAL_TABLET | Freq: Every day | ORAL | Status: DC
  Administered 2023-06-11: 2 mg via ORAL
  Filled 2023-06-11: qty 1

## 2023-06-11 MED ORDER — LORAZEPAM 1 MG PO TABS
1.0000 mg | ORAL_TABLET | Freq: Three times a day (TID) | ORAL | Status: DC
  Administered 2023-06-11 – 2023-06-12 (×2): 1 mg via ORAL
  Filled 2023-06-11 (×3): qty 1

## 2023-06-11 MED ORDER — PANTOPRAZOLE SODIUM 40 MG PO TBEC
40.0000 mg | DELAYED_RELEASE_TABLET | Freq: Every day | ORAL | Status: DC
  Administered 2023-06-12: 40 mg via ORAL
  Filled 2023-06-11 (×2): qty 1

## 2023-06-11 MED ORDER — APIXABAN 5 MG PO TABS
5.0000 mg | ORAL_TABLET | Freq: Two times a day (BID) | ORAL | Status: DC
  Administered 2023-06-11 – 2023-06-12 (×3): 5 mg via ORAL
  Filled 2023-06-11 (×3): qty 1

## 2023-06-11 MED ORDER — INSULIN ASPART 100 UNIT/ML IJ SOLN
0.0000 [IU] | Freq: Every day | INTRAMUSCULAR | Status: DC
  Administered 2023-06-11: 4 [IU] via SUBCUTANEOUS

## 2023-06-11 MED ORDER — IPRATROPIUM-ALBUTEROL 0.5-2.5 (3) MG/3ML IN SOLN
3.0000 mL | Freq: Four times a day (QID) | RESPIRATORY_TRACT | Status: DC
Start: 2023-06-11 — End: 2023-06-12
  Administered 2023-06-11 – 2023-06-12 (×5): 3 mL via RESPIRATORY_TRACT
  Filled 2023-06-11 (×5): qty 3

## 2023-06-11 MED ORDER — INSULIN ASPART 100 UNIT/ML IJ SOLN
0.0000 [IU] | Freq: Three times a day (TID) | INTRAMUSCULAR | Status: DC
  Administered 2023-06-11: 5 [IU] via SUBCUTANEOUS
  Administered 2023-06-11: 11 [IU] via SUBCUTANEOUS
  Administered 2023-06-12: 5 [IU] via SUBCUTANEOUS
  Administered 2023-06-12: 8 [IU] via SUBCUTANEOUS

## 2023-06-11 MED ORDER — LORAZEPAM 1 MG PO TABS: 1.0000 mg | ORAL_TABLET | Freq: Every day | ORAL | Status: DC

## 2023-06-11 MED ORDER — LORAZEPAM 0.5 MG PO TABS
0.5000 mg | ORAL_TABLET | Freq: Every morning | ORAL | Status: DC
  Administered 2023-06-11: 0.5 mg via ORAL
  Filled 2023-06-11: qty 1

## 2023-06-11 MED ORDER — METHYLPREDNISOLONE SODIUM SUCC 40 MG IJ SOLR
40.0000 mg | Freq: Two times a day (BID) | INTRAMUSCULAR | Status: DC
  Administered 2023-06-11 – 2023-06-12 (×3): 40 mg via INTRAVENOUS
  Filled 2023-06-11 (×3): qty 1

## 2023-06-11 MED ORDER — ARFORMOTEROL TARTRATE 15 MCG/2ML IN NEBU
15.0000 ug | INHALATION_SOLUTION | Freq: Two times a day (BID) | RESPIRATORY_TRACT | Status: DC
  Administered 2023-06-11 – 2023-06-12 (×3): 15 ug via RESPIRATORY_TRACT
  Filled 2023-06-11 (×3): qty 2

## 2023-06-11 MED ORDER — INSULIN GLARGINE-YFGN 100 UNIT/ML ~~LOC~~ SOLN
30.0000 [IU] | Freq: Two times a day (BID) | SUBCUTANEOUS | Status: DC
  Administered 2023-06-11 – 2023-06-12 (×3): 30 [IU] via SUBCUTANEOUS
  Filled 2023-06-11 (×4): qty 0.3

## 2023-06-11 MED ORDER — VITAMIN B-12 1000 MCG PO TABS
1000.0000 ug | ORAL_TABLET | Freq: Every day | ORAL | Status: DC
  Administered 2023-06-11 – 2023-06-12 (×2): 1000 ug via ORAL
  Filled 2023-06-11 (×2): qty 1

## 2023-06-11 MED ORDER — AMOXICILLIN-POT CLAVULANATE 875-125 MG PO TABS
1.0000 | ORAL_TABLET | Freq: Two times a day (BID) | ORAL | Status: DC
  Administered 2023-06-11 – 2023-06-12 (×3): 1 via ORAL
  Filled 2023-06-11 (×3): qty 1

## 2023-06-11 MED ORDER — LOPERAMIDE HCL 2 MG PO CAPS
4.0000 mg | ORAL_CAPSULE | Freq: Once | ORAL | Status: AC
  Administered 2023-06-11: 4 mg via ORAL
  Filled 2023-06-11: qty 2

## 2023-06-11 MED ORDER — OXYCODONE HCL 5 MG PO TABS
5.0000 mg | ORAL_TABLET | Freq: Four times a day (QID) | ORAL | Status: DC | PRN
  Administered 2023-06-11: 5 mg via ORAL
  Filled 2023-06-11 (×2): qty 1

## 2023-06-11 MED ORDER — LOPERAMIDE HCL 2 MG PO CAPS
2.0000 mg | ORAL_CAPSULE | Freq: Three times a day (TID) | ORAL | Status: DC | PRN
  Administered 2023-06-11: 2 mg via ORAL
  Filled 2023-06-11: qty 1

## 2023-06-11 MED ORDER — GUAIFENESIN-DM 100-10 MG/5ML PO SYRP
5.0000 mL | ORAL_SOLUTION | ORAL | Status: DC | PRN
  Administered 2023-06-12 (×2): 5 mL via ORAL
  Filled 2023-06-11 (×2): qty 10

## 2023-06-11 MED ORDER — FLUTICASONE PROPIONATE 50 MCG/ACT NA SUSP
2.0000 | Freq: Every day | NASAL | Status: DC
  Administered 2023-06-11 – 2023-06-12 (×2): 2 via NASAL
  Filled 2023-06-11: qty 16

## 2023-06-11 MED ORDER — BUDESONIDE 0.25 MG/2ML IN SUSP
0.2500 mg | Freq: Two times a day (BID) | RESPIRATORY_TRACT | Status: DC
Start: 2023-06-11 — End: 2023-06-12
  Administered 2023-06-11 – 2023-06-12 (×3): 0.25 mg via RESPIRATORY_TRACT
  Filled 2023-06-11 (×3): qty 2

## 2023-06-11 MED ORDER — ESCITALOPRAM OXALATE 10 MG PO TABS
10.0000 mg | ORAL_TABLET | Freq: Every day | ORAL | Status: DC
  Administered 2023-06-11 – 2023-06-12 (×2): 10 mg via ORAL
  Filled 2023-06-11 (×2): qty 1

## 2023-06-11 NOTE — Evaluation (Signed)
 Occupational Therapy Evaluation Patient Details Name: Amber Stephenson MRN: [id number] DOB: March 11, 1946 Today's Date: 06/11/2023   History of Present Illness 78 y.o. female admitted with RSV and a recent fall. Pt with medical history significant of CAD, chronic diastolic heart failure, A-fib on Eliquis , chronic hypoxic respiratory failure on 2 L [place], asthma, allergic rhinitis, insulin -dependent type 2 diabetes, peripheral neuropathy, hypertension, hyperlipidemia, endometrial cancer s/p radiation therapy, bipolar disorder, PTSD, BPPV, chronic pain syndrome, OSA, pulmonary hypertension, RLS, essential tremor, GERD, osteopenia.   Clinical Impression   Prior to hospital admission, pt was independent with ADLs, spouse provides assist with IADLs. Pt lives with spouse. Pt currently requires minA for bed mobility, CGA for transfers EOB>BSC>recliner using step pivot.  Toileting tasks with CGA using lateral leans. SpO2 92%+ on 3L. Pt would benefit from skilled OT services to address noted impairments and functional limitations (see below for any additional details) in order to maximize safety and independence while minimizing falls risk and caregiver burden. Anticipate the need for follow up [facility] OT services upon acute hospital DC.       If plan is discharge home, recommend the following: A little help with walking and/or transfers;A little help with bathing/dressing/bathroom;Assist for transportation    Functional Status Assessment  Patient has had a recent decline in their functional status and demonstrates the ability to make significant improvements in function in a reasonable and predictable amount of time.  Equipment Recommendations  BSC/3in1       Precautions / Restrictions Precautions Precautions: Fall Restrictions Weight Bearing Restrictions Per Provider Order: No      Mobility Bed Mobility Overal bed mobility: Needs Assistance Bed Mobility: Sidelying to Sit   Sidelying to sit: Min  assist            Transfers Overall transfer level: Needs assistance   Transfers: Sit to/from Stand, Bed to chair/wheelchair/BSC Sit to Stand: Contact guard assist     Step pivot transfers: Contact guard assist            Balance Overall balance assessment: Needs assistance Sitting-balance support: Feet supported, Single extremity supported Sitting balance-Leahy Scale: Good     Standing balance support: Single extremity supported Standing balance-Leahy Scale: Poor                             ADL either performed or assessed with clinical judgement   ADL Overall ADL's : Needs assistance/impaired Eating/Feeding: Independent;Sitting   Grooming: Set up;Sitting   Upper Body Bathing: Sitting;Contact guard assist   Lower Body Bathing: Sit to/from stand;Contact guard assist   Upper Body Dressing : Sitting;Contact guard assist   Lower Body Dressing: Sit to/from stand;Contact guard assist   Toilet Transfer: Contact guard assist;Supervision/safety;BSC/3in1;Stand-pivot Toilet Transfer Details (indicate cue type and reason): CGA progressing to supervision Toileting- Clothing Manipulation and Hygiene: Sitting/lateral lean;Contact guard assist       Functional mobility during ADLs: Contact guard assist General ADL Comments: mild unsteadiness, pt stands to pivot to [facility]     Vision Baseline Vision/History: 1 Wears glasses Vision Assessment?: Wears glasses for reading            Pertinent Vitals/Pain Pain Assessment Pain Assessment: Faces Faces Pain Scale: Hurts even more Pain Location: bottom (pt states hemheroids) Pain Descriptors / Indicators: Grimacing, Moaning, Tender, Discomfort, Dull Pain Intervention(s): Limited activity within patient's tolerance, Monitored during session     Extremity/Trunk Assessment Upper Extremity Assessment Upper Extremity Assessment: Defer to OT evaluation  Lower Extremity Assessment Lower Extremity Assessment:  Overall WFL for tasks assessed;RLE deficits/detail;LLE deficits/detail RLE Sensation: history of peripheral neuropathy LLE Sensation: history of peripheral neuropathy   Cervical / Trunk Assessment Cervical / Trunk Assessment: Normal   Communication Communication Communication: No apparent difficulties   Cognition Arousal: Alert Behavior During Therapy: Anxious (pt with brief startling episodes, suddenly crying out and saying she had passed out. anxious throughout, requires cues for calming and relaxation techniques\) Overall Cognitive Status: No family/caregiver present to determine baseline cognitive functioning                                                  Home Living Family/patient expects to be discharged to:: Private residence Living Arrangements: Spouse/significant other Available Help at Discharge: Family;Available 24 hours/day Type of Home: House Home Access: Stairs to enter [employer] of Steps: 3-4 Entrance Stairs-Rails: Left Home Layout: Able to live on main level with bedroom/bathroom     Bathroom Shower/Tub: Tub/shower unit;Walk-in shower   Bathroom Toilet: Standard Bathroom Accessibility: Yes How Accessible: Accessible via walker Home Equipment: Rolling Walker (2 wheels);Cane - single point          Prior Functioning/Environment Prior Level of Function : Needs assist;History of Falls (last six months)             Mobility Comments: fell just prior to admission, reports 1 other near fall in past 6 months, used RW when going out or for longer distances, no AD in home ADLs Comments: ADLs ind, spouse assists with IADLs as needed including driving        OT Problem List: Decreased activity tolerance;Impaired balance (sitting and/or standing);Pain;Decreased knowledge of use of DME or AE      OT Treatment/Interventions: Self-care/ADL training;Energy conservation;Therapeutic exercise;Therapeutic  activities;Patient/family education;Balance training    OT Goals(Current goals can be found in the care plan section) Acute Rehab OT Goals OT Goal Formulation: With patient  OT Frequency: Min 1X/week       AM-PAC OT 6 Clicks Daily Activity     Outcome Measure Help from another person eating meals?: None Help from another person taking care of personal grooming?: None Help from another person toileting, which includes using toliet, bedpan, or urinal?: A Little Help from another person bathing (including washing, rinsing, drying)?: A Little Help from another person to put on and taking off regular upper body clothing?: A Little Help from another person to put on and taking off regular lower body clothing?: A Little 6 Click Score: 20   End of Session Equipment Utilized During Treatment: Oxygen  Nurse Communication: Mobility status  Activity Tolerance: Patient tolerated treatment well Patient left: in chair;with chair alarm set;with call bell/phone within reach;with nursing/sitter in room;with family/visitor present  OT Visit Diagnosis: History of falling (Z91.81);Muscle weakness (generalized) (M62.81);Unsteadiness on feet (R26.81)                Time: 9045-8981 OT Time Calculation (min): 24 min Charges:  OT General Charges $OT Visit: 1 Visit OT Evaluation $OT Eval Low Complexity: 1 Low OT Treatments $Self Care/Home Management : 8-22 mins Kalaya Infantino L. Monee Dembeck, OTR/L  06/11/23, 12:57 PM

## 2023-06-11 NOTE — Progress Notes (Signed)
 [facility]                                                                              Amber Stephenson, is a 78 y.o. female, DOB - 1946/05/21, FMW:[id number] Admit date - 06/10/2023    Outpatient Primary MD for the patient is [name], MD  LOS - 0  days  Chief Complaint  Patient presents with   Cough   Nasal Congestion       Brief summary   Patient is a 78 year old female with CAD, chronic diastolic CHF, A-fib on Eliquis , chronic hypoxic respiratory failure is on 3 L O2, asthma, allergic rhinitis, IDDM, peripheral neuropathy, HTN, HLP, endometrial CA status post XRT, bipolar disorder, PTSD, chronic pain syndrome, OSA, RLS, GERD, other medical comorbidities presented to ED with cough, shortness of breath, nausea and diarrhea. Patient was seen by urgent care on 1/1 for URI symptoms, chest x-ray showed right upper lobe perihilar opacity concerning for early bronchopneumonia, was prescribed Augmentin .   Seen again in urgent care on 1/6 due to persistent symptoms and was prescribed Zithromax  and prednisone . On 1/8, patient presented to [facility] ED with worsening symptoms of cough, wheezing, shortness of breath, nausea and diarrhea. Per patient, started having diarrhea after starting antibiotics, complained of abdominal pain, poor oral intake, generalized weakness.  Had a mechanical fall 2 days ago and reported hitting the back of her head against a wall. Temp 98.1 F, pulse 94, RR 18, BP 158/83, O2 sats 96% on room air.  RSV PCR positive.  Assessment & Plan    Principal Problem: Acute asthma exacerbation with RSV (respiratory syncytial virus infection), pansinusitis -Presented with shortness of breath, wheezing, cough.  No fevers.  RSV positive. -Placed on scheduled nebs, IV Solu-Medrol  40 mg every 12 hours, Pulmicort , Brovana  -Placed on Tessalon  Perles, Robitussin -Flutter valve -CT head showed pansinusitis, placed on Flonase  spray, Claritin , Augmentin  twice  daily  Active Problems:  Abdominal pain, diarrhea -CT abdomen pelvis did not show acute abdominal pathology -C. difficile negative -GI pathogen panel negative -Added loperamide   Acute on chronic hyponatremia - In the setting of acute illness/poor p.o. intake and diarrhea. -Sodium 123 on admission, improved to 134 with IV fluid hydration.  -Hold losartan , HCTZ   Generalized weakness/physical deconditioning Fall at home -Reported recent fall at home due to generalized weakness.  CT head showed no acute bleeding or injury. -Continue fall precautions, PT OT evaluation   CAD, history of chronic HFpEF -Troponins negative, no acute EKG changes.  - Last echo done in August 2024 showing EF 60 to 65%, trivial MVR, mild AVR.   - BNP 304 but no signs of volume overload.   Atrial fibrillation Currently rate controlled -Resume Eliquis .  CT head negative for any bleeding.   Insulin -dependent type 2 diabetes Well-controlled-last A1c 6.6 on 05/22/2023.   -Currently on IV steroids CBG (last 3)  Recent Labs    06/10/23 2258 06/11/23 0347 06/11/23 0738  GLUCAP 253* 192* 163*   -Placed on sliding scale insulin  while inpatient, Semglee  30 units twice daily per outpatient regimen   Chronic normocytic anemia Hemoglobin stable.   Peripheral neuropathy -Currently stable,  continue Lyrica   Hypertension:  -BP elevated, resume metoprolol , labetalol  IV as needed with parameters -Holding Lasix , losartan , HCTZ  Hyperlipidemia -Continue statin   Bipolar disorder -Continue Lexapro   Chronic pain syndrome, RLS -Resume requip , continue outpatient pain medication regimen  GERD Continue PPI   Obesity Estimated body mass index is 32.92 kg/m as calculated from the following:   Height as of this encounter: 5' 2 (1.575 m).   Weight as of this encounter: 81.6 kg.  Code Status: Full CODE STATUS DVT Prophylaxis:  SCDs Start: 06/10/23 2221   Level of Care: Level of care:  Med-Surg Family Communication: Updated patient Disposition Plan:      Remains inpatient appropriate:      Procedures:    Consultants:     Antimicrobials:   Anti-infectives (From admission, onward)    Start     Dose/Rate Route Frequency Ordered Stop   06/11/23 1000  amoxicillin -clavulanate (AUGMENTIN ) 875-125 MG per tablet 1 tablet        1 tablet Oral Every 12 hours 06/11/23 0827            Medications  amoxicillin -clavulanate  1 tablet Oral Q12H   arformoterol   15 mcg Nebulization BID   benzonatate   200 mg Oral TID   budesonide  (PULMICORT ) nebulizer solution  0.25 mg Nebulization BID   fluticasone   2 spray Each Nare Daily   insulin  aspart  0-15 Units Subcutaneous TID WC   insulin  aspart  0-5 Units Subcutaneous QHS   insulin  glargine-yfgn  30 Units Subcutaneous BID   ipratropium-albuterol   3 mL Nebulization Q6H   methylPREDNISolone  (SOLU-MEDROL ) injection  40 mg Intravenous Q12H   metoprolol  tartrate  50 mg Oral BID   oxyCODONE   20 mg Oral Q12H   pantoprazole   40 mg Oral Q0600      Subjective:   Amber Stephenson was seen and examined today.  Cough, wheezing+, states has chronic pain and takes her pain medications at 6 AM and 6 PM.  No fevers or chills, no abdominal pain. + Diarrhea.  No nausea, fevers.  Objective:   Vitals:   06/11/23 0046 06/11/23 0517 06/11/23 0547 06/11/23 0609  BP: (!) 151/99 (!) 145/91  (!) 160/91  Pulse: (!) 103 (!) 104  (!) 104  Resp: 19 18  19   Temp: 97.8 F (36.6 C) 97.8 F (36.6 C)  98.8 F (37.1 C)  TempSrc: Oral Oral  Oral  SpO2: 100% 100% 95% 99%  Weight:      Height:        Intake/Output Summary (Last 24 hours) at 06/11/2023 1110 Last data filed at 06/11/2023 1100 Gross per 24 hour  Intake 1540 ml  Output --  Net 1540 ml     Wt Readings from Last 3 Encounters:  06/10/23 81.6 kg  06/05/23 82.1 kg  05/25/23 81.7 kg     Exam General: Alert and oriented x 3, NAD Cardiovascular: S1 S2 auscultated,   RRR Respiratory: Bilateral expiratory wheezing Gastrointestinal: Soft, nontender, nondistended, + bowel sounds Ext: no pedal edema bilaterally Neuro: no new deficits Psych: Normal affect     Data Reviewed:  I have personally reviewed following labs    CBC Lab Results  Component Value Date   WBC 5.9 06/11/2023   RBC 3.49 (L) 06/11/2023   HGB 10.6 (L) 06/11/2023   HCT 32.5 (L) 06/11/2023   MCV 93.1 06/11/2023   MCH 30.4 06/11/2023   PLT 180 06/11/2023   MCHC 32.6 06/11/2023   RDW 16.7 (H) 06/11/2023  LYMPHSABS 0.6 (L) 05/14/2023   MONOABS 0.6 05/14/2023   EOSABS 0.1 05/14/2023   BASOSABS 0.0 05/14/2023     Last metabolic panel Lab Results  Component Value Date   NA 134 (L) 06/11/2023   K 3.8 06/11/2023   CL 97 (L) 06/11/2023   CO2 28 06/11/2023   BUN 11 06/11/2023   CREATININE 0.60 06/11/2023   GLUCOSE 163 (H) 06/11/2023   GFRNONAA >60 06/11/2023   GFRAA 71 06/13/2020   CALCIUM  8.8 (L) 06/11/2023   PHOS 3.1 01/13/2023   PROT 6.8 06/10/2023   ALBUMIN  3.9 06/10/2023   LABGLOB 2.7 02/03/2023   AGRATIO 1.4 09/16/2022   BILITOT 0.4 06/10/2023   ALKPHOS 81 06/10/2023   AST 24 06/10/2023   ALT 14 06/10/2023   ANIONGAP 9 06/11/2023    CBG (last 3)  Recent Labs    06/10/23 2258 06/11/23 0347 06/11/23 0738  GLUCAP 253* 192* 163*      Coagulation Profile: No results for input(s): INR, PROTIME in the last 168 hours.   Radiology Studies: I have personally reviewed the imaging studies  CT HEAD WO CONTRAST ( ) Result Date: 06/11/2023 CLINICAL DATA:  [name] and hit head 2 days ago, also complains of right lower quadrant abdominal pain and diarrhea. CTA chest was performed earlier today. EXAM: CT HEAD WITHOUT CONTRAST CT ABDOMEN AND PELVIS WITHOUT CONTRAST TECHNIQUE: Contiguous axial images were obtained from the base of the skull through the vertex without intravenous contrast. Multiplanar CT image reconstructions were also generated. Multidetector CT imaging  of the abdomen and pelvis was performed following the standard protocol without IV contrast. RADIATION DOSE REDUCTION: This exam was performed according to the departmental dose-optimization program which includes automated exposure control, adjustment of the mA and/or kV according to patient size and/or use of iterative reconstruction technique. COMPARISON:  CTA chest today, head CT 05/14/2023, CT chest, abdomen and pelvis without contrast 05/14/2023, CT abdomen and pelvis without contrast 01/13/2023. FINDINGS: CT HEAD FINDINGS Brain: No evidence of acute infarction, hemorrhage, hydrocephalus, extra-axial collection or mass lesion/mass effect. There is mild global atrophy, mild small-vessel disease of the cerebral white matter. No midline shift. Basal cisterns are clear. Vascular: There are scattered calcific plaques in the carotid siphons. No hyperdense central vessel is seen. Skull: Negative for fractures or focal lesions. No visible scalp hematoma. Hyperostosis frontalis interna. Sinuses/Orbits: There is interval new finding of patchy opacification of the ethmoid sinus air cells, increased membrane thickening in the sphenoid and maxillary sinuses, increased membrane disease in the right frontal sinus, and a short fluid level in the left maxillary cavity. The mastoid air cells and middle ears are clear. Old lens replacements are again noted with otherwise negative orbits. Midline nasal septum. Other: None. CT ABDOMEN AND PELVIS FINDINGS Lower chest: Mild cardiomegaly. No pericardial effusion. The inferior mitral ring is heavily calcified. Lung bases show mild posterior atelectasis without infiltrates. Hepatobiliary: No focal liver abnormality is seen without contrast. The gallbladder and bile ducts are unremarkable. Pancreas: Partially atrophic.  Otherwise unremarkable contrast The greatest fatty infiltration is in the head and uncinate process. Spleen: No abnormality. Adrenals/Urinary Tract: Stable 1 cm left  adrenal nodule including dating back to a scan from 12/13/2020, assumed benign at this point. No right adrenal lesion is seen. There is a 9 mm Bosniak 1 cyst of the lateral upper left kidney, Hounsfield density is 4.3. No follow-up imaging is recommended. No other focal abnormality of the unenhanced kidneys is seen. No collecting system stones, ureteral stones or  hydronephrosis. Faint contrast from today's CTA is seen in the ureters, with contrast in the bladder. There is no bladder thickening. Stomach/Bowel: No dilatation or wall thickening. Normal appendix. Moderate fecal stasis. Sigmoid diverticulosis without evidence of diverticulitis. Vascular/Lymphatic: Aortic atherosclerosis. No enlarged abdominal or pelvic lymph nodes. Multiple pelvic phleboliths. Reproductive: Uterus and bilateral adnexa are unremarkable. Other: Moderate-sized right inguinal fat hernia is noted chronically. There are small umbilical and left inguinal fat hernias. There is no incarcerated hernia. There is no free fluid, free air, or free hemorrhage, no focal inflammatory process. Musculoskeletal: There is osteopenia. There is mild chronic anterior wedge deformity of the T9, T11, T12, and L2 vertebral bodies. Stable degenerative changes. No destructive bone lesions. IMPRESSION: 1. No acute intracranial CT findings or depressed skull fractures. 2. Atrophy and small-vessel disease. 3. Interval new finding of pansinusitis. 4. No acute noncontrast CT findings in the abdomen or pelvis. 5. Constipation and diverticulosis. 6. Aortic and mitral ring atherosclerosis. 7. Stable 1 cm left adrenal nodule, assumed benign at this point. 8. Osteopenia and degenerative change. 9. Chronic mild anterior wedge deformities of the T9, T11, T12, and L2 vertebral bodies. 10. Umbilical and inguinal fat hernias. Aortic Atherosclerosis (ICD10-I70.0). Electronically Signed   By: Francis Quam M.D.   On: 06/11/2023 00:25   CT ABDOMEN PELVIS WO CONTRAST Result Date:  06/11/2023 CLINICAL DATA:  [name] and hit head 2 days ago, also complains of right lower quadrant abdominal pain and diarrhea. CTA chest was performed earlier today. EXAM: CT HEAD WITHOUT CONTRAST CT ABDOMEN AND PELVIS WITHOUT CONTRAST TECHNIQUE: Contiguous axial images were obtained from the base of the skull through the vertex without intravenous contrast. Multiplanar CT image reconstructions were also generated. Multidetector CT imaging of the abdomen and pelvis was performed following the standard protocol without IV contrast. RADIATION DOSE REDUCTION: This exam was performed according to the departmental dose-optimization program which includes automated exposure control, adjustment of the mA and/or kV according to patient size and/or use of iterative reconstruction technique. COMPARISON:  CTA chest today, head CT 05/14/2023, CT chest, abdomen and pelvis without contrast 05/14/2023, CT abdomen and pelvis without contrast 01/13/2023. FINDINGS: CT HEAD FINDINGS Brain: No evidence of acute infarction, hemorrhage, hydrocephalus, extra-axial collection or mass lesion/mass effect. There is mild global atrophy, mild small-vessel disease of the cerebral white matter. No midline shift. Basal cisterns are clear. Vascular: There are scattered calcific plaques in the carotid siphons. No hyperdense central vessel is seen. Skull: Negative for fractures or focal lesions. No visible scalp hematoma. Hyperostosis frontalis interna. Sinuses/Orbits: There is interval new finding of patchy opacification of the ethmoid sinus air cells, increased membrane thickening in the sphenoid and maxillary sinuses, increased membrane disease in the right frontal sinus, and a short fluid level in the left maxillary cavity. The mastoid air cells and middle ears are clear. Old lens replacements are again noted with otherwise negative orbits. Midline nasal septum. Other: None. CT ABDOMEN AND PELVIS FINDINGS Lower chest: Mild cardiomegaly. No  pericardial effusion. The inferior mitral ring is heavily calcified. Lung bases show mild posterior atelectasis without infiltrates. Hepatobiliary: No focal liver abnormality is seen without contrast. The gallbladder and bile ducts are unremarkable. Pancreas: Partially atrophic.  Otherwise unremarkable contrast The greatest fatty infiltration is in the head and uncinate process. Spleen: No abnormality. Adrenals/Urinary Tract: Stable 1 cm left adrenal nodule including dating back to a scan from 12/13/2020, assumed benign at this point. No right adrenal lesion is seen. There is a 9 mm Bosniak 1  cyst of the lateral upper left kidney, Hounsfield density is 4.3. No follow-up imaging is recommended. No other focal abnormality of the unenhanced kidneys is seen. No collecting system stones, ureteral stones or hydronephrosis. Faint contrast from today's CTA is seen in the ureters, with contrast in the bladder. There is no bladder thickening. Stomach/Bowel: No dilatation or wall thickening. Normal appendix. Moderate fecal stasis. Sigmoid diverticulosis without evidence of diverticulitis. Vascular/Lymphatic: Aortic atherosclerosis. No enlarged abdominal or pelvic lymph nodes. Multiple pelvic phleboliths. Reproductive: Uterus and bilateral adnexa are unremarkable. Other: Moderate-sized right inguinal fat hernia is noted chronically. There are small umbilical and left inguinal fat hernias. There is no incarcerated hernia. There is no free fluid, free air, or free hemorrhage, no focal inflammatory process. Musculoskeletal: There is osteopenia. There is mild chronic anterior wedge deformity of the T9, T11, T12, and L2 vertebral bodies. Stable degenerative changes. No destructive bone lesions. IMPRESSION: 1. No acute intracranial CT findings or depressed skull fractures. 2. Atrophy and small-vessel disease. 3. Interval new finding of pansinusitis. 4. No acute noncontrast CT findings in the abdomen or pelvis. 5. Constipation and  diverticulosis. 6. Aortic and mitral ring atherosclerosis. 7. Stable 1 cm left adrenal nodule, assumed benign at this point. 8. Osteopenia and degenerative change. 9. Chronic mild anterior wedge deformities of the T9, T11, T12, and L2 vertebral bodies. 10. Umbilical and inguinal fat hernias. Aortic Atherosclerosis (ICD10-I70.0). Electronically Signed   By: Francis Quam M.D.   On: 06/11/2023 00:25   CT Angio Chest PE W/Cm &/Or Wo Cm Result Date: 06/10/2023 CLINICAL DATA:  Shortness of breath, productive cough. EXAM: CT ANGIOGRAPHY CHEST WITH CONTRAST TECHNIQUE: Multidetector CT imaging of the chest was performed using the standard protocol during bolus administration of intravenous contrast. Multiplanar CT image reconstructions and MIPs were obtained to evaluate the vascular anatomy. RADIATION DOSE REDUCTION: This exam was performed according to the departmental dose-optimization program which includes automated exposure control, adjustment of the mA and/or kV according to patient size and/or use of iterative reconstruction technique. CONTRAST:  75mL OMNIPAQUE  IOHEXOL  350 MG/ML SOLN COMPARISON:  May 14, 2023. FINDINGS: Cardiovascular: Satisfactory opacification of the pulmonary arteries to the segmental level. No evidence of pulmonary embolism. Mild cardiomegaly. No pericardial effusion. Mediastinum/Nodes: No enlarged mediastinal, hilar, or axillary lymph nodes. Thyroid  gland, trachea, and esophagus demonstrate no significant findings. Lungs/Pleura: No pneumothorax or pleural effusion is noted. Minimal bibasilar subsegmental atelectasis is noted. Upper Abdomen: No acute abnormality. Musculoskeletal: Status post right shoulder arthroplasty. No acute osseous abnormality is noted. Review of the MIP images confirms the above findings. IMPRESSION: No definite evidence of pulmonary embolus. Minimal bibasilar subsegmental atelectasis. Electronically Signed   By: Lynwood Landy Raddle M.D.   On: 06/10/2023 15:33   DG  Chest 2 View Result Date: 06/10/2023 CLINICAL DATA:  Shortness of breath. EXAM: CHEST - 2 VIEW COMPARISON:  06/03/2023. FINDINGS: Low lung volume. Redemonstration of increased interstitial markings throughout bilateral lungs, slightly worsened, which may be accentuated by low lung volume. This is nonspecific and may represent underlying atypical pneumonia versus pulmonary edema. Apparent right superior hilar opacity described on the prior exam is unchanged. Bilateral lung fields are otherwise clear. Bilateral costophrenic angles are clear. Note is made of elevated right hemidiaphragm. Stable cardio-mediastinal silhouette. No acute osseous abnormalities. Right reverse shoulder arthroplasty again seen. The soft tissues are within normal limits. IMPRESSION: *Slight interval increase in the increased interstitial markings throughout bilateral lungs, likely accentuated by low lung volume. This is nonspecific and may represent underlying atypical  pneumonia versus pulmonary edema. *Otherwise no acute consolidation or lung collapse. Electronically Signed   By: Ree Molt M.D.   On: 06/10/2023 10:02       Amber Stephenson M.D. [facility] 06/11/2023, 11:10 AM  Available via Epic secure chat 7am-7pm After 7 pm, please refer to night coverage provider listed on amion.

## 2023-06-11 NOTE — Evaluation (Signed)
 Physical Therapy Evaluation Patient Details Name: DAJANAE BROPHY MRN: [id number] DOB: 10-31-1945 Today's Date: 06/11/2023  History of Present Illness  78 y.o. female admitted with RSV and a recent fall. Pt with medical history significant of CAD, chronic diastolic heart failure, A-fib on Eliquis , chronic hypoxic respiratory failure on 2 L , asthma, allergic rhinitis, insulin -dependent type 2 diabetes, peripheral neuropathy, hypertension, hyperlipidemia, endometrial cancer s/p radiation therapy, bipolar disorder, PTSD, BPPV, chronic pain syndrome, OSA, pulmonary hypertension, RLS, essential tremor, GERD, osteopenia.  Clinical Impression  Pt admitted with above diagnosis. Pt ambulated 30' without an assistive device, she was noted to be unsteady and would benefit from using a RW. HR was 141 with a short walk, SpO2 87% on room air. Activity was discontinued by PT due to elevated HR with walking. Encouraged pt to perform seated BLE/UE strengthening exercises to minimize deconditioning during hospitalization.  Pt currently with functional limitations due to the deficits listed below (see PT Problem List). Pt will benefit from acute skilled PT to increase their independence and safety with mobility to allow discharge.           If plan is discharge home, recommend the following: A little help with walking and/or transfers;A little help with bathing/dressing/bathroom;Assistance with cooking/housework;Assist for transportation;Help with stairs or ramp for entrance   Can travel by private vehicle        Equipment Recommendations None recommended by PT  Recommendations for Other Services       Functional Status Assessment Patient has had a recent decline in their functional status and demonstrates the ability to make significant improvements in function in a reasonable and predictable amount of time.     Precautions / Restrictions Precautions Precautions: Fall Restrictions Weight Bearing  Restrictions Per Provider Order: No      Mobility  Bed Mobility               General bed mobility comments: pt standing at edge of bed at start of session    Transfers                   General transfer comment: pt standing at EOB at start of session    Ambulation/Gait Ambulation/Gait assistance: Contact guard assist Gait Distance (Feet): 30 Feet Assistive device: None Gait Pattern/deviations: Step-through pattern, Decreased stride length Gait velocity: decr     General Gait Details: unsteady, mild LOB x 3, HR 141 walking so returned to bed, SpO2 87% on room air walking; pt  declined AD but would benefit from [employer]            Wheelchair Mobility     Tilt Bed    Modified Rankin (Stroke Patients Only)       Balance Overall balance assessment: Needs assistance Sitting-balance support: Feet supported, No upper extremity supported Sitting balance-Leahy Scale: Good     Standing balance support: Single extremity supported, Reliant on assistive device for balance Standing balance-Leahy Scale: Poor                               Pertinent Vitals/Pain Pain Assessment Pain Assessment: 0-10 Pain Score: 6  Pain Location: back (chronic) Pain Descriptors / Indicators: Sore Pain Intervention(s): Limited activity within patient's tolerance, Monitored during session, Premedicated before session    Home Living Family/patient expects to be discharged to:: Private residence Living Arrangements: Spouse/significant other Available Help at Discharge: Family;Available 24 hours/day Type of Home: House Home  Access: Stairs to enter Entrance Stairs-Rails: Left Entrance Stairs-Number of Steps: 3-4   Home Layout: Able to live on main level with bedroom/bathroom Home Equipment: [employer] (2 wheels);Cane - single point      Prior Function Prior Level of Function : Needs assist;History of Falls (last six months)             Mobility  Comments: fell just prior to admission, reports 1 other near fall in past 6 months, used RW when going out or for longer distances, no AD in home ADLs Comments: ADLs ind, spouse assists with IADLs as needed including driving     Extremity/Trunk Assessment   Upper Extremity Assessment Upper Extremity Assessment: Defer to OT evaluation    Lower Extremity Assessment Lower Extremity Assessment: Overall WFL for tasks assessed;RLE deficits/detail;LLE deficits/detail RLE Sensation: history of peripheral neuropathy LLE Sensation: history of peripheral neuropathy    Cervical / Trunk Assessment Cervical / Trunk Assessment: Normal  Communication   Communication Communication: No apparent difficulties  Cognition Arousal: Alert Behavior During Therapy: WFL for tasks assessed/performed Overall Cognitive Status: Within Functional Limits for tasks assessed                                          General Comments      Exercises     Assessment/Plan    PT Assessment Patient needs continued PT services  PT Problem List Decreased activity tolerance;Decreased balance;Decreased mobility;Pain       PT Treatment Interventions DME instruction;Therapeutic exercise;Gait training;Functional mobility training;Therapeutic activities;Patient/family education    PT Goals (Current goals can be found in the Care Plan section)  Acute Rehab PT Goals Patient Stated Goal: get stronger PT Goal Formulation: With patient/family Time For Goal Achievement: 06/25/23 Potential to Achieve Goals: Good    Frequency Min 1X/week     Co-evaluation               AM-PAC PT 6 Clicks Mobility  Outcome Measure Help needed turning from your back to your side while in a flat bed without using bedrails?: None Help needed moving from lying on your back to sitting on the side of a flat bed without using bedrails?: A Little Help needed moving to and from a bed to a chair (including a  wheelchair)?: A Little Help needed standing up from a chair using your arms (e.g., wheelchair or bedside chair)?: A Little Help needed to walk in hospital room?: A Little Help needed climbing 3-5 steps with a railing? : A Little 6 Click Score: 19    End of Session Equipment Utilized During Treatment: Gait belt Activity Tolerance: Treatment limited secondary to medical complications (Comment) (elevated HR with walking) Patient left: in bed;with call bell/phone within reach;with family/visitor present Nurse Communication: Mobility status PT Visit Diagnosis: History of falling (Z91.81);Difficulty in walking, not elsewhere classified (R26.2)    Time: 8894-8877 PT Time Calculation (min) (ACUTE ONLY): 17 min   Charges:   PT Evaluation $PT Eval Moderate Complexity: 1 Mod   PT General Charges $$ ACUTE PT VISIT: 1 Visit        Sylvan Delon Copp PT 06/11/2023  Acute Rehabilitation Services  Office [phone]

## 2023-06-11 NOTE — Plan of Care (Signed)
  Problem: Education: Goal: Knowledge of General Education information will improve Description: Including pain rating scale, medication(s)/side effects and non-pharmacologic comfort measures Outcome: Progressing   Problem: Activity: Goal: Risk for activity intolerance will decrease Outcome: Progressing   Problem: Pain Management: Goal: General experience of comfort will improve Outcome: Progressing

## 2023-06-12 DIAGNOSIS — I5032 Chronic diastolic (congestive) heart failure: Secondary | ICD-10-CM

## 2023-06-12 DIAGNOSIS — B338 Other specified viral diseases: Secondary | ICD-10-CM | POA: Diagnosis not present

## 2023-06-12 DIAGNOSIS — E871 Hypo-osmolality and hyponatremia: Secondary | ICD-10-CM | POA: Diagnosis not present

## 2023-06-12 DIAGNOSIS — R109 Unspecified abdominal pain: Secondary | ICD-10-CM

## 2023-06-12 DIAGNOSIS — R531 Weakness: Secondary | ICD-10-CM | POA: Diagnosis not present

## 2023-06-12 LAB — CBC
HCT: 32.5 % — ABNORMAL LOW (ref 36.0–46.0)
Hemoglobin: 10.8 g/dL — ABNORMAL LOW (ref 12.0–15.0)
MCH: 30.9 pg (ref 26.0–34.0)
MCHC: 33.2 g/dL (ref 30.0–36.0)
MCV: 92.9 fL (ref 80.0–100.0)
Platelets: 170 10*3/uL (ref 150–400)
RBC: 3.5 MIL/uL — ABNORMAL LOW (ref 3.87–5.11)
RDW: 17.1 % — ABNORMAL HIGH (ref 11.5–15.5)
WBC: 8 10*3/uL (ref 4.0–10.5)
nRBC: 0 % (ref 0.0–0.2)

## 2023-06-12 LAB — BASIC METABOLIC PANEL
Anion gap: 8 (ref 5–15)
BUN: 14 mg/dL (ref 8–23)
CO2: 26 mmol/L (ref 22–32)
Calcium: 9.3 mg/dL (ref 8.9–10.3)
Chloride: 100 mmol/L (ref 98–111)
Creatinine, Ser: 0.53 mg/dL (ref 0.44–1.00)
GFR, Estimated: >60 mL/min (ref >60)
Glucose, Bld: 276 mg/dL — ABNORMAL HIGH (ref 70–99)
Potassium: 4.8 mmol/L (ref 3.5–5.1)
Sodium: 134 mmol/L — ABNORMAL LOW (ref 135–145)

## 2023-06-12 LAB — GLUCOSE, CAPILLARY
Glucose-Capillary: 262 mg/dL — ABNORMAL HIGH (ref 70–99)
Glucose-Capillary: 207 mg/dL — ABNORMAL HIGH (ref 70–99)

## 2023-06-12 MED ORDER — INSULIN ASPART 100 UNIT/ML IJ SOLN
5.0000 [IU] | Freq: Three times a day (TID) | INTRAMUSCULAR | Status: DC
  Administered 2023-06-12 (×2): 5 [IU] via SUBCUTANEOUS

## 2023-06-12 NOTE — Plan of Care (Signed)
  Problem: Clinical Measurements: Goal: Diagnostic test results will improve Outcome: Progressing Goal: Respiratory complications will improve Outcome: Progressing   Problem: Activity: Goal: Risk for activity intolerance will decrease Outcome: Progressing   

## 2023-06-12 NOTE — Telephone Encounter (Signed)
 Marland Kitchen

## 2023-06-12 NOTE — Discharge Summary (Signed)
 Physician Discharge Summary  Amber Stephenson FMW:[id number] DOB: [birth year] DOA: 06/10/2023  PCP: [name], MD  Admit date: 06/10/2023 Discharge date: 06/12/2023 Admitted From: Home. Disposition: Home Recommendations for Outpatient Follow-up:  Outpatient follow-up with PCP as below Check CMP and CBC in 1 week Please follow up on the following pending results: None   Home Health: HH PT/OT Equipment/Devices: None  Discharge Condition: Stable CODE STATUS: Full code  Follow-up Information     [name], MD. Schedule an appointment as soon as possible for a visit in 1 week(s).   Specialty: Family Medicine Contact information: [address] [place] [address] [phone]         Care, [facility] Follow up.   Specialty: Home Health Services Why: to provide home physical therapy visits Contact information: [address] [place] [address] [phone]                 Hospital course 78 year old F with PMH of CAD, chronic diastolic CHF, A-fib on Eliquis , chronic hypoxic respiratory failure is on 3 L O2, asthma, allergic rhinitis, IDDM, peripheral neuropathy, HTN, HLP, endometrial CA status post XRT, bipolar disorder, PTSD, chronic pain syndrome, OSA, RLS, GERD, other medical comorbidities presented to ED with cough, shortness of breath, nausea and diarrhea, and admitted with working diagnosis of asthma exacerbation in the setting of RSV infection, hyponatremia and diarrhea. Patient was seen by urgent care on 1/1 for URI symptoms, chest x-ray showed right upper lobe perihilar opacity concerning for early bronchopneumonia, was prescribed Augmentin .   Seen again in urgent care on 1/6 due to persistent symptoms and was prescribed Zithromax  and prednisone .  On 1/8, patient presented to [facility] ED with worsening symptoms of cough, wheezing, shortness of breath, nausea and diarrhea. Per patient, started having diarrhea after starting  antibiotics, complained of abdominal pain, poor oral intake, generalized weakness.  Had a mechanical fall 2 days ago and reported hitting the back of her head against a wall. Temp 98.1 F, pulse 94, RR 18, BP 158/83, O2 sats 96% on room air.  CT head showed pansinusitis for which she was started on Flonase , Claritin  and Augmentin  twice daily.  Patient was started on Solu-Medrol , scheduled and as needed nebulizers, mucolytic's and antitussive  with improvement in her symptoms.  On the day of discharge, patient felt well and requested discharge to complete treatment outpatient.    See individual problem list below for more.   Problems addressed during this hospitalization Acute asthma exacerbation with RSV infection: (respiratory syncytial virus infection) -Presented with shortness of breath, wheezing, cough.  No fevers.  RSV positive. -Placed on scheduled nebs, IV Solu-Medrol  40 mg every 12 hours, Pulmicort , Brovana  with improvement in her symptoms. -Discharged on p.o. prednisone  for 5 more days.  Continue Advair and albuterol .  Parasinusitis: CT head showed pansinusitis likely due to the above -Continue Flonase  spray, Claritin , saline nose spray -Continue home Augmentin  twice daily I doubt this is bacterial.  Abdominal pain, diarrhea: Resolved CT abdomen and pelvis without acute finding.  CDF and GIP nonreactive.   Acute on chronic hyponatremia: Likely due to diarrhea.  Improved. -Discontinued HCTZ on discharge   Generalized weakness/physical deconditioning Fall at home: CT head and CT cervical spine without acute finding. -HH PT/OT.    CAD, history of chronic HFpEF: Stable.  Troponins negative, no acute EKG changes.  TTE August 2024 showing EF 60 to 65%, trivial MVR, mild AVR.  BNP 304 but no signs of volume overload. -Continue  home Lasix  and other cardiac meds.   Atrial fibrillation: Rate controlled. -Continue metoprolol  and Eliquis .   IDDM-2 with hyperglycemia: A1c 6.6% on  12/20. Recent Labs  Lab 06/11/23 1147 06/11/23 1632 06/11/23 2111 06/12/23 0728 06/12/23 1142  GLUCAP 232* 327* 338* 262* 207*  -Continue home regimen. -Recommended good hydration while on steroid.  Chronic normocytic anemia: Stable   Peripheral neuropathy -Currently stable, continue Lyrica    Hypertension:  -Continue home metoprolol , losartan  and Lasix . -Discontinued HCTZ   Hyperlipidemia -Continue statin  Bipolar disorder: Stable -Continue Lexapro    Chronic pain syndrome, RLS -Resume requip , continue outpatient pain medication regimen   GERD Continue PPI   Obesity Body mass index is 32.92 kg/m.             Time spent 35 minutes  Vital signs Vitals:   06/12/23 0259 06/12/23 0634 06/12/23 0753 06/12/23 1102  BP:  (!) 163/103    Pulse:  99    Temp:  97.9 F (36.6 C)    Resp:  18    Height:      Weight:      SpO2: 94% 98% 90% (!) 87%  TempSrc:  Oral    BMI (Calculated):         Discharge exam  GENERAL: No apparent distress.  Nontoxic. HEENT: MMM.  Vision and hearing grossly intact.  NECK: Supple.  No apparent JVD.  RESP:  No IWOB.  Fair aeration bilaterally.  Some rhonchi bilaterally. CVS:  RRR. Heart sounds normal.  ABD/GI/GU: BS+. Abd soft, NTND.  MSK/EXT:  Moves extremities. No apparent deformity. No edema.  SKIN: no apparent skin lesion or wound NEURO: Awake and alert. Oriented appropriately.  No apparent focal neuro deficit. PSYCH: Calm. Normal affect.   Discharge Instructions Discharge Instructions     Diet - low sodium heart healthy   Complete by: As directed    Diet Carb Modified   Complete by: As directed    Discharge instructions   Complete by: As directed    It has been a pleasure taking care of you!  You were hospitalized due to asthma exacerbation in the setting of RSV infection.  Your symptoms improved with treatment.  Continue using your prednisone  and breathing treatments.  Maintain good hydration.  Recommend holding  your fluid medications (Lasix  and spironolactone ) until your symptoms improved.  Follow-up with your primary care doctor in 1 to 2 weeks or sooner if needed.   Take care,   Increase activity slowly   Complete by: As directed       Allergies as of 06/12/2023       Reactions   Iodine Anaphylaxis   Ivp Dye [iodinated Contrast Media] Anaphylaxis, Swelling, Other (See Comments)   Throat closes   Latex Other (See Comments)   Latex tape pulls skin with it   Tape Other (See Comments)   Pulls off the skin, if latex   Tizanidine  Other (See Comments)   Weakness, goofy, bad dreams        Medication List     STOP taking these medications    azithromycin  250 MG tablet Commonly known as: ZITHROMAX    hydrochlorothiazide  12.5 MG capsule Commonly known as: MICROZIDE    ondansetron  8 MG disintegrating tablet Commonly known as: ZOFRAN -ODT       TAKE these medications    albuterol  108 (90 Base) MCG/ACT inhaler Commonly known as: VENTOLIN  HFA Inhale 2 puffs into the lungs every 6 (six) hours as needed for wheezing or shortness of breath.  allopurinol  100 MG tablet Commonly known as: ZYLOPRIM  Take 100 mg by mouth daily.   amoxicillin -clavulanate 875-125 MG tablet Commonly known as: AUGMENTIN  Take 1 tablet by mouth every 12 (twelve) hours.   apixaban  5 MG Tabs tablet Commonly known as: Eliquis  Take 1 tablet (5 mg total) by mouth 2 (two) times daily.   ARIPiprazole  2 MG tablet Commonly known as: ABILIFY  Take 2 mg by mouth at bedtime.   atorvastatin  80 MG tablet Commonly known as: LIPITOR Take 1 tablet (80 mg total) by mouth daily.   benzonatate  100 MG capsule Commonly known as: TESSALON  Take 2 capsules (200 mg total) by mouth 3 (three) times daily as needed for cough.   clotrimazole  10 MG troche Commonly known as: MYCELEX  Take 10 mg by mouth in the morning and at bedtime.   cyanocobalamin  1000 MCG tablet Commonly known as: VITAMIN B12 Take 1 tablet (1,000 mcg  total) by mouth daily.   doxepin  10 MG capsule Commonly known as: SINEQUAN  Take 10 mg by mouth at bedtime.   Emgality  120 MG/ML Soaj Generic drug: Galcanezumab -gnlm INJECT CONTENTS ON 1 PEN(120MG ) INTO THE SKIN ONCE MONTHLY What changed:  how much to take how to take this when to take this additional instructions   escitalopram  10 MG tablet Commonly known as: LEXAPRO  Take 10 mg by mouth daily.   fluticasone -salmeterol 100-50 MCG/ACT Aepb Commonly known as: Advair Diskus Inhale 1 puff into the lungs 2 (two) times daily. What changed:  when to take this reasons to take this   furosemide  20 MG tablet Commonly known as: LASIX  Take 20 mg by mouth daily.   hydrOXYzine  25 MG capsule Commonly known as: VISTARIL  Take 25 mg by mouth daily at 6 (six) AM.   insulin  lispro 100 UNIT/ML KwikPen Commonly known as: HumaLOG  KwikPen Max daily 45 units What changed:  how much to take how to take this when to take this   Insulin  Pen Needle 29G X Misc 1 Device by Does not apply route daily in the afternoon.   Lantus  SoloStar 100 UNIT/ML Solostar Pen Generic drug: insulin  glargine Inject 32 Units into the skin 2 (two) times daily.   levocetirizine 5 MG tablet Commonly known as: Xyzal  Allergy 24HR Take 1 tablet (5 mg total) by mouth every evening. For itch   lidocaine  2 % solution Commonly known as: XYLOCAINE  Use as directed 15 mLs in the mouth or throat every 4 (four) hours as needed for mouth pain.   LORazepam  0.5 MG tablet Commonly known as: ATIVAN  Take 0.5-1 mg by mouth See admin instructions. Take 0.5 mg by mouth in the morning and 1 mg at bedtime   losartan  25 MG tablet Commonly known as: COZAAR  Take 25 mg by mouth daily.   meclizine  25 MG tablet Commonly known as: ANTIVERT  Take 1 tablet (25 mg total) by mouth 3 (three) times daily as needed for dizziness.   metFORMIN  500 MG 24 hr tablet Commonly known as: GLUCOPHAGE -XR Take 2 tablets (1,000 mg total) by  mouth daily with breakfast.   Methocarbamol  1000 MG Tabs Take 1,000 mg by mouth every 6 (six) hours as needed for muscle spasms.   metoprolol  tartrate 50 MG tablet Commonly known as: LOPRESSOR  Take 1 tablet (50 mg total) by mouth 2 (two) times daily.   naloxone  4 MG/0.1ML Liqd nasal spray kit Commonly known as: NARCAN  Place 1 spray into the nose once as needed (AS DIRECTED).   nystatin  powder Commonly known as: MYCOSTATIN /NYSTOP  Apply 1 Application topically as  needed (rash).   nystatin  ointment Commonly known as: MYCOSTATIN  Apply 1 Application topically 2 (two) times daily as needed (for rashes).   omeprazole  40 MG capsule Commonly known as: PRILOSEC Take 40 mg by mouth daily.   Omnipod 5 G7 Pods (Gen 5) Misc 1 Device by Does not apply route every other day.   Omnipod 5 G7 Intro (Gen 5) Kit 1 Device by Does not apply route every other day.   ondansetron  4 MG tablet Commonly known as: Zofran  Take 1 tablet (4 mg total) by mouth every 8 (eight) hours as needed for nausea or vomiting. For cancer related nausea   OXcarbazepine  150 MG tablet Commonly known as: TRILEPTAL  Take 1 tablet (150 mg total) by mouth 2 (two) times daily.   Oxycodone  HCl 20 MG Tabs Take 1 tablet by mouth 2 (two) times daily as needed. What changed: Another medication with the same name was removed. Continue taking this medication, and follow the directions you see here.   pantoprazole  40 MG tablet Commonly known as: PROTONIX  Take 40 mg by mouth daily as needed (for reflux).   potassium chloride  10 MEQ tablet Commonly known as: KLOR-CON  Take 1 tablet (10 mEq total) by mouth daily.   predniSONE  20 MG tablet Commonly known as: DELTASONE  Take 2 tablets (40 mg total) by mouth daily with breakfast for 5 days.   pregabalin  200 MG capsule Commonly known as: LYRICA  Take 200 mg by mouth 2 (two) times daily.   Prolia  60 MG/ML Sosy injection Generic drug: denosumab  Inject 60 mg into the skin every 6  (six) months.   promethazine  25 MG tablet Commonly known as: PHENERGAN  Take 0.5-1 tablets (12.5-25 mg total) by mouth every 6 (six) hours as needed for refractory nausea / vomiting (cancer related nausea/vomiting).   promethazine -dextromethorphan  6.25-15 MG/5ML syrup Commonly known as: PROMETHAZINE -DM Take 5 mLs by mouth 3 (three) times daily as needed for cough.   rOPINIRole  1 MG tablet Commonly known as: REQUIP  Take 1 tablet (1 mg total) by mouth at bedtime. For leg cramps   senna-docusate 8.6-50 MG tablet Commonly known as: Senokot-S Take 1 tablet by mouth at bedtime. What changed:  when to take this reasons to take this   spironolactone  25 MG tablet Commonly known as: ALDACTONE  Take 0.5 tablets (12.5 mg total) by mouth daily.   tirzepatide  7.5 MG/0.5ML Pen Commonly known as: MOUNJARO  Inject 7.5 mg into the skin once a week.   TYLENOL  500 MG tablet Generic drug: acetaminophen  Take 500-1,000 mg by mouth every 6 (six) hours as needed for mild pain (pain score 1-3) or headache.   Vitamin D  (Cholecalciferol ) 25 MCG (1000 UT) Tabs Take 1,000 Units by mouth daily in the afternoon.        Consultations: None  Procedures/Studies:   CT HEAD WO CONTRAST ( ) Result Date: 06/11/2023 CLINICAL DATA:  [name] and hit head 2 days ago, also complains of right lower quadrant abdominal pain and diarrhea. CTA chest was performed earlier today. EXAM: CT HEAD WITHOUT CONTRAST CT ABDOMEN AND PELVIS WITHOUT CONTRAST TECHNIQUE: Contiguous axial images were obtained from the base of the skull through the vertex without intravenous contrast. Multiplanar CT image reconstructions were also generated. Multidetector CT imaging of the abdomen and pelvis was performed following the standard protocol without IV contrast. RADIATION DOSE REDUCTION: This exam was performed according to the departmental dose-optimization program which includes automated exposure control, adjustment of the mA and/or kV  according to patient size and/or use of iterative reconstruction technique. COMPARISON:  CTA chest today, head CT 05/14/2023, CT chest, abdomen and pelvis without contrast 05/14/2023, CT abdomen and pelvis without contrast 01/13/2023. FINDINGS: CT HEAD FINDINGS Brain: No evidence of acute infarction, hemorrhage, hydrocephalus, extra-axial collection or mass lesion/mass effect. There is mild global atrophy, mild small-vessel disease of the cerebral white matter. No midline shift. Basal cisterns are clear. Vascular: There are scattered calcific plaques in the carotid siphons. No hyperdense central vessel is seen. Skull: Negative for fractures or focal lesions. No visible scalp hematoma. Hyperostosis frontalis interna. Sinuses/Orbits: There is interval new finding of patchy opacification of the ethmoid sinus air cells, increased membrane thickening in the sphenoid and maxillary sinuses, increased membrane disease in the right frontal sinus, and a short fluid level in the left maxillary cavity. The mastoid air cells and middle ears are clear. Old lens replacements are again noted with otherwise negative orbits. Midline nasal septum. Other: None. CT ABDOMEN AND PELVIS FINDINGS Lower chest: Mild cardiomegaly. No pericardial effusion. The inferior mitral ring is heavily calcified. Lung bases show mild posterior atelectasis without infiltrates. Hepatobiliary: No focal liver abnormality is seen without contrast. The gallbladder and bile ducts are unremarkable. Pancreas: Partially atrophic.  Otherwise unremarkable contrast The greatest fatty infiltration is in the head and uncinate process. Spleen: No abnormality. Adrenals/Urinary Tract: Stable 1 cm left adrenal nodule including dating back to a scan from 12/13/2020, assumed benign at this point. No right adrenal lesion is seen. There is a 9 mm Bosniak 1 cyst of the lateral upper left kidney, Hounsfield density is 4.3. No follow-up imaging is recommended. No other focal  abnormality of the unenhanced kidneys is seen. No collecting system stones, ureteral stones or hydronephrosis. Faint contrast from today's CTA is seen in the ureters, with contrast in the bladder. There is no bladder thickening. Stomach/Bowel: No dilatation or wall thickening. Normal appendix. Moderate fecal stasis. Sigmoid diverticulosis without evidence of diverticulitis. Vascular/Lymphatic: Aortic atherosclerosis. No enlarged abdominal or pelvic lymph nodes. Multiple pelvic phleboliths. Reproductive: Uterus and bilateral adnexa are unremarkable. Other: Moderate-sized right inguinal fat hernia is noted chronically. There are small umbilical and left inguinal fat hernias. There is no incarcerated hernia. There is no free fluid, free air, or free hemorrhage, no focal inflammatory process. Musculoskeletal: There is osteopenia. There is mild chronic anterior wedge deformity of the T9, T11, T12, and L2 vertebral bodies. Stable degenerative changes. No destructive bone lesions. IMPRESSION: 1. No acute intracranial CT findings or depressed skull fractures. 2. Atrophy and small-vessel disease. 3. Interval new finding of pansinusitis. 4. No acute noncontrast CT findings in the abdomen or pelvis. 5. Constipation and diverticulosis. 6. Aortic and mitral ring atherosclerosis. 7. Stable 1 cm left adrenal nodule, assumed benign at this point. 8. Osteopenia and degenerative change. 9. Chronic mild anterior wedge deformities of the T9, T11, T12, and L2 vertebral bodies. 10. Umbilical and inguinal fat hernias. Aortic Atherosclerosis (ICD10-I70.0). Electronically Signed   By: Francis Quam M.D.   On: 06/11/2023 00:25   CT ABDOMEN PELVIS WO CONTRAST Result Date: 06/11/2023 CLINICAL DATA:  [name] and hit head 2 days ago, also complains of right lower quadrant abdominal pain and diarrhea. CTA chest was performed earlier today. EXAM: CT HEAD WITHOUT CONTRAST CT ABDOMEN AND PELVIS WITHOUT CONTRAST TECHNIQUE: Contiguous axial images  were obtained from the base of the skull through the vertex without intravenous contrast. Multiplanar CT image reconstructions were also generated. Multidetector CT imaging of the abdomen and pelvis was performed following the standard protocol without IV contrast. RADIATION DOSE REDUCTION:  This exam was performed according to the departmental dose-optimization program which includes automated exposure control, adjustment of the mA and/or kV according to patient size and/or use of iterative reconstruction technique. COMPARISON:  CTA chest today, head CT 05/14/2023, CT chest, abdomen and pelvis without contrast 05/14/2023, CT abdomen and pelvis without contrast 01/13/2023. FINDINGS: CT HEAD FINDINGS Brain: No evidence of acute infarction, hemorrhage, hydrocephalus, extra-axial collection or mass lesion/mass effect. There is mild global atrophy, mild small-vessel disease of the cerebral white matter. No midline shift. Basal cisterns are clear. Vascular: There are scattered calcific plaques in the carotid siphons. No hyperdense central vessel is seen. Skull: Negative for fractures or focal lesions. No visible scalp hematoma. Hyperostosis frontalis interna. Sinuses/Orbits: There is interval new finding of patchy opacification of the ethmoid sinus air cells, increased membrane thickening in the sphenoid and maxillary sinuses, increased membrane disease in the right frontal sinus, and a short fluid level in the left maxillary cavity. The mastoid air cells and middle ears are clear. Old lens replacements are again noted with otherwise negative orbits. Midline nasal septum. Other: None. CT ABDOMEN AND PELVIS FINDINGS Lower chest: Mild cardiomegaly. No pericardial effusion. The inferior mitral ring is heavily calcified. Lung bases show mild posterior atelectasis without infiltrates. Hepatobiliary: No focal liver abnormality is seen without contrast. The gallbladder and bile ducts are unremarkable. Pancreas: Partially  atrophic.  Otherwise unremarkable contrast The greatest fatty infiltration is in the head and uncinate process. Spleen: No abnormality. Adrenals/Urinary Tract: Stable 1 cm left adrenal nodule including dating back to a scan from 12/13/2020, assumed benign at this point. No right adrenal lesion is seen. There is a 9 mm Bosniak 1 cyst of the lateral upper left kidney, Hounsfield density is 4.3. No follow-up imaging is recommended. No other focal abnormality of the unenhanced kidneys is seen. No collecting system stones, ureteral stones or hydronephrosis. Faint contrast from today's CTA is seen in the ureters, with contrast in the bladder. There is no bladder thickening. Stomach/Bowel: No dilatation or wall thickening. Normal appendix. Moderate fecal stasis. Sigmoid diverticulosis without evidence of diverticulitis. Vascular/Lymphatic: Aortic atherosclerosis. No enlarged abdominal or pelvic lymph nodes. Multiple pelvic phleboliths. Reproductive: Uterus and bilateral adnexa are unremarkable. Other: Moderate-sized right inguinal fat hernia is noted chronically. There are small umbilical and left inguinal fat hernias. There is no incarcerated hernia. There is no free fluid, free air, or free hemorrhage, no focal inflammatory process. Musculoskeletal: There is osteopenia. There is mild chronic anterior wedge deformity of the T9, T11, T12, and L2 vertebral bodies. Stable degenerative changes. No destructive bone lesions. IMPRESSION: 1. No acute intracranial CT findings or depressed skull fractures. 2. Atrophy and small-vessel disease. 3. Interval new finding of pansinusitis. 4. No acute noncontrast CT findings in the abdomen or pelvis. 5. Constipation and diverticulosis. 6. Aortic and mitral ring atherosclerosis. 7. Stable 1 cm left adrenal nodule, assumed benign at this point. 8. Osteopenia and degenerative change. 9. Chronic mild anterior wedge deformities of the T9, T11, T12, and L2 vertebral bodies. 10. Umbilical and  inguinal fat hernias. Aortic Atherosclerosis (ICD10-I70.0). Electronically Signed   By: Francis Quam M.D.   On: 06/11/2023 00:25   CT Angio Chest PE W/Cm &/Or Wo Cm Result Date: 06/10/2023 CLINICAL DATA:  Shortness of breath, productive cough. EXAM: CT ANGIOGRAPHY CHEST WITH CONTRAST TECHNIQUE: Multidetector CT imaging of the chest was performed using the standard protocol during bolus administration of intravenous contrast. Multiplanar CT image reconstructions and MIPs were obtained to evaluate the vascular anatomy.  RADIATION DOSE REDUCTION: This exam was performed according to the departmental dose-optimization program which includes automated exposure control, adjustment of the mA and/or kV according to patient size and/or use of iterative reconstruction technique. CONTRAST:  75mL OMNIPAQUE  IOHEXOL  350 MG/ML SOLN COMPARISON:  May 14, 2023. FINDINGS: Cardiovascular: Satisfactory opacification of the pulmonary arteries to the segmental level. No evidence of pulmonary embolism. Mild cardiomegaly. No pericardial effusion. Mediastinum/Nodes: No enlarged mediastinal, hilar, or axillary lymph nodes. Thyroid  gland, trachea, and esophagus demonstrate no significant findings. Lungs/Pleura: No pneumothorax or pleural effusion is noted. Minimal bibasilar subsegmental atelectasis is noted. Upper Abdomen: No acute abnormality. Musculoskeletal: Status post right shoulder arthroplasty. No acute osseous abnormality is noted. Review of the MIP images confirms the above findings. IMPRESSION: No definite evidence of pulmonary embolus. Minimal bibasilar subsegmental atelectasis. Electronically Signed   By: Lynwood Landy Raddle M.D.   On: 06/10/2023 15:33   DG Chest 2 View Result Date: 06/10/2023 CLINICAL DATA:  Shortness of breath. EXAM: CHEST - 2 VIEW COMPARISON:  06/03/2023. FINDINGS: Low lung volume. Redemonstration of increased interstitial markings throughout bilateral lungs, slightly worsened, which may be accentuated by  low lung volume. This is nonspecific and may represent underlying atypical pneumonia versus pulmonary edema. Apparent right superior hilar opacity described on the prior exam is unchanged. Bilateral lung fields are otherwise clear. Bilateral costophrenic angles are clear. Note is made of elevated right hemidiaphragm. Stable cardio-mediastinal silhouette. No acute osseous abnormalities. Right reverse shoulder arthroplasty again seen. The soft tissues are within normal limits. IMPRESSION: *Slight interval increase in the increased interstitial markings throughout bilateral lungs, likely accentuated by low lung volume. This is nonspecific and may represent underlying atypical pneumonia versus pulmonary edema. *Otherwise no acute consolidation or lung collapse. Electronically Signed   By: Ree Molt M.D.   On: 06/10/2023 10:02   DG Chest 2 View Result Date: 06/03/2023 CLINICAL DATA:  Three day history of productive cough. EXAM: CHEST - 2 VIEW COMPARISON:  05/14/2023 FINDINGS: Mild cardiac enlargement. No signs of pleural effusion or interstitial edema. Perihilar opacity within the right upper lobe is identified concerning for early bronchopneumonia. Left lung appears clear. Chronic interstitial coarsening appears unchanged from previous exam. Calcifications of the mitral valve annulus. Previous right shoulder arthroplasty. IMPRESSION: Right upper lobe perihilar opacity concerning for early bronchopneumonia. Followup PA and lateral chest X-ray is recommended in 3-4 weeks following trial of antibiotic therapy to ensure resolution and exclude underlying malignancy. Electronically Signed   By: Waddell Calk M.D.   On: 06/03/2023 15:29   CT CHEST ABDOMEN PELVIS WO CONTRAST Result Date: 05/14/2023 CLINICAL DATA:  Left-sided pain, fell 2 weeks ago, nausea and vomiting since yesterday EXAM: CT CHEST, ABDOMEN AND PELVIS WITHOUT CONTRAST TECHNIQUE: Multidetector CT imaging of the chest, abdomen and pelvis was performed  following the standard protocol without IV contrast. RADIATION DOSE REDUCTION: This exam was performed according to the departmental dose-optimization program which includes automated exposure control, adjustment of the mA and/or kV according to patient size and/or use of iterative reconstruction technique. COMPARISON:  01/13/2023 FINDINGS: CT CHEST FINDINGS Cardiovascular: Unenhanced imaging of the heart demonstrates stable cardiomegaly and dense calcification of the mitral annulus. Stable dilatation of the left atrium. Normal caliber of the thoracic aorta. Stable atherosclerosis of the aorta and coronary vasculature. Evaluation of the vascular lumen is limited without IV contrast. Mediastinum/Nodes: No enlarged mediastinal, hilar, or axillary lymph nodes. Thyroid  gland, trachea, and esophagus demonstrate no significant findings. Lungs/Pleura: Mild residual reticular opacities within the right upper lobe  and lingula could reflect postinflammatory scarring. The multifocal bilateral airspace disease seen previously has resolved. No effusion or pneumothorax. Central airways are patent. Musculoskeletal: No acute displaced fracture. Right shoulder arthroplasty. Reconstructed images demonstrate no additional findings. CT ABDOMEN PELVIS FINDINGS Hepatobiliary: Unremarkable unenhanced appearance of the liver and gallbladder. Pancreas: Unremarkable unenhanced appearance. Spleen: Unremarkable unenhanced appearance. Adrenals/Urinary Tract: No urinary tract calculi or obstructive uropathy within either kidney. The adrenals are stable. The bladder is unremarkable. Stomach/Bowel: No bowel obstruction or ileus. No bowel wall thickening or inflammatory change. Vascular/Lymphatic: Aortic atherosclerosis. No enlarged abdominal or pelvic lymph nodes. Reproductive: Uterus and bilateral adnexa are unremarkable. Other: No free fluid or free intraperitoneal gas. Stable fat containing right inguinal hernia. No bowel herniation.  Musculoskeletal: No acute or destructive bony abnormalities. Stable spondylosis at the thoracolumbar junction. Reconstructed images demonstrate no additional findings. IMPRESSION: 1. No acute intrathoracic, intra-abdominal, or intrapelvic trauma identified on this exam limited by the lack of IV contrast. 2. Stable cardiomegaly and dense mitral annular calcification. 3. Aortic Atherosclerosis (ICD10-I70.0). Coronary artery atherosclerosis. Electronically Signed   By: Ozell Daring M.D.   On: 05/14/2023 15:12   CT Head Wo Contrast Result Date: 05/14/2023 CLINICAL DATA:  Head trauma, minor (Age >= 65y); Neck trauma (Age >= 65y) EXAM: CT HEAD WITHOUT CONTRAST CT CERVICAL SPINE WITHOUT CONTRAST TECHNIQUE: Multidetector CT imaging of the head and cervical spine was performed following the standard protocol without intravenous contrast. Multiplanar CT image reconstructions of the cervical spine were also generated. RADIATION DOSE REDUCTION: This exam was performed according to the departmental dose-optimization program which includes automated exposure control, adjustment of the mA and/or kV according to patient size and/or use of iterative reconstruction technique. COMPARISON:  CT Head and C Spine 11/14/22 FINDINGS: CT HEAD FINDINGS Brain: No hemorrhage. No hydrocephalus. No extra-axial fluid collection. No CT evidence of an acute cortical infarct. No mass effect. No mass lesion. Vascular: No hyperdense vessel or unexpected calcification. Skull: Normal. Negative for fracture or focal lesion. Sinuses/Orbits: No middle ear or mastoid effusion. Paranasal sinuses are clear. Bilateral lens replacement. Orbits are otherwise unremarkable. Other: None. CT CERVICAL SPINE FINDINGS Alignment: Normal. Skull base and vertebrae: No acute fracture. No primary bone lesion or focal pathologic process. Soft tissues and spinal canal: No prevertebral fluid or swelling. No visible canal hematoma. Disc levels:  No CT evidence of high-grade  spinal stenosis Upper chest: Negative. Other: None IMPRESSION: 1. No CT evidence of intracranial injury. 2. No acute fracture or traumatic subluxation of the cervical spine. Electronically Signed   By: Lyndall Gore M.D.   On: 05/14/2023 14:13   CT Cervical Spine Wo Contrast Result Date: 05/14/2023 CLINICAL DATA:  Head trauma, minor (Age >= 65y); Neck trauma (Age >= 65y) EXAM: CT HEAD WITHOUT CONTRAST CT CERVICAL SPINE WITHOUT CONTRAST TECHNIQUE: Multidetector CT imaging of the head and cervical spine was performed following the standard protocol without intravenous contrast. Multiplanar CT image reconstructions of the cervical spine were also generated. RADIATION DOSE REDUCTION: This exam was performed according to the departmental dose-optimization program which includes automated exposure control, adjustment of the mA and/or kV according to patient size and/or use of iterative reconstruction technique. COMPARISON:  CT Head and C Spine 11/14/22 FINDINGS: CT HEAD FINDINGS Brain: No hemorrhage. No hydrocephalus. No extra-axial fluid collection. No CT evidence of an acute cortical infarct. No mass effect. No mass lesion. Vascular: No hyperdense vessel or unexpected calcification. Skull: Normal. Negative for fracture or focal lesion. Sinuses/Orbits: No middle ear or mastoid effusion.  Paranasal sinuses are clear. Bilateral lens replacement. Orbits are otherwise unremarkable. Other: None. CT CERVICAL SPINE FINDINGS Alignment: Normal. Skull base and vertebrae: No acute fracture. No primary bone lesion or focal pathologic process. Soft tissues and spinal canal: No prevertebral fluid or swelling. No visible canal hematoma. Disc levels:  No CT evidence of high-grade spinal stenosis Upper chest: Negative. Other: None IMPRESSION: 1. No CT evidence of intracranial injury. 2. No acute fracture or traumatic subluxation of the cervical spine. Electronically Signed   By: Lyndall Gore M.D.   On: 05/14/2023 14:13   DG Chest  Port 1 View Result Date: 05/14/2023 CLINICAL DATA:  Fall.  Left-sided pain. EXAM: PORTABLE CHEST 1 VIEW COMPARISON:  01/28/2023. FINDINGS: Redemonstration of increased interstitial markings, similar to the prior study. No frank pulmonary edema. No acute dense consolidation or lung collapse. Bilateral costophrenic angles are clear. Stable cardio-mediastinal silhouette. No acute osseous abnormalities. The soft tissues are within normal limits. IMPRESSION: No active disease. Electronically Signed   By: Ree Molt M.D.   On: 05/14/2023 13:09       The results of significant diagnostics from this hospitalization (including imaging, microbiology, ancillary and laboratory) are listed below for reference.     Microbiology: Recent Results (from the past 240 hours)  Resp panel by RT-PCR (RSV, Flu A&B, Covid) Anterior Nasal Swab     Status: Abnormal   Collection Time: 06/10/23  9:40 AM   Specimen: Anterior Nasal Swab  Result Value Ref Range Status   SARS Coronavirus 2 by RT PCR NEGATIVE NEGATIVE Final    Comment: (NOTE) SARS-CoV-2 target nucleic acids are NOT DETECTED.  The SARS-CoV-2 RNA is generally detectable in upper respiratory specimens during the acute phase of infection. The lowest concentration of SARS-CoV-2 viral copies this assay can detect is 138 copies/mL. A negative result does not preclude SARS-Cov-2 infection and should not be used as the sole basis for treatment or other patient management decisions. A negative result may occur with  improper specimen collection/handling, submission of specimen other than nasopharyngeal swab, presence of viral mutation(s) within the areas targeted by this assay, and inadequate number of viral copies(<138 copies/mL). A negative result must be combined with clinical observations, patient history, and epidemiological information. The expected result is Negative.  Fact Sheet for Patients:  bloggercourse.com  Fact  Sheet for Healthcare Providers:  seriousbroker.it  This test is no t yet approved or cleared by the [place]  FDA and  has been authorized for detection and/or diagnosis of SARS-CoV-2 by FDA under an Emergency Use Authorization (EUA). This EUA will remain  in effect (meaning this test can be used) for the duration of the COVID-19 declaration under Section 564(b)(1) of the Act, 21 U.S.C.section 360bbb-3(b)(1), unless the authorization is terminated  or revoked sooner.       Influenza A by PCR NEGATIVE NEGATIVE Final   Influenza B by PCR NEGATIVE NEGATIVE Final    Comment: (NOTE) The Xpert Xpress SARS-CoV-2/FLU/RSV plus assay is intended as an aid in the diagnosis of influenza from Nasopharyngeal swab specimens and should not be used as a sole basis for treatment. Nasal washings and aspirates are unacceptable for Xpert Xpress SARS-CoV-2/FLU/RSV testing.  Fact Sheet for Patients: bloggercourse.com  Fact Sheet for Healthcare Providers: seriousbroker.it  This test is not yet approved or cleared by the [place]  FDA and has been authorized for detection and/or diagnosis of SARS-CoV-2 by FDA under an Emergency Use Authorization (EUA). This EUA will remain in effect (meaning this test can  be used) for the duration of the COVID-19 declaration under Section 564(b)(1) of the Act, 21 U.S.C. section 360bbb-3(b)(1), unless the authorization is terminated or revoked.     Resp Syncytial Virus by PCR POSITIVE (A) NEGATIVE Final    Comment: (NOTE) Fact Sheet for Patients: bloggercourse.com  Fact Sheet for Healthcare Providers: seriousbroker.it  This test is not yet approved or cleared by the [place]  FDA and has been authorized for detection and/or diagnosis of SARS-CoV-2 by FDA under an Emergency Use Authorization (EUA). This EUA will remain in effect  (meaning this test can be used) for the duration of the COVID-19 declaration under Section 564(b)(1) of the Act, 21 U.S.C. section 360bbb-3(b)(1), unless the authorization is terminated or revoked.  Performed at [facility], [address], [place], [place] [address]   C Difficile Quick Screen w PCR reflex     Status: None   Collection Time: 06/10/23  9:59 PM   Specimen: STOOL  Result Value Ref Range Status   C Diff antigen NEGATIVE NEGATIVE Final   C Diff toxin NEGATIVE NEGATIVE Final   C Diff interpretation No C. difficile detected.  Final    Comment: Performed at [facility], [address] [place], [place] [address]  Gastrointestinal Panel by PCR , Stool     Status: None   Collection Time: 06/10/23  9:59 PM   Specimen: STOOL  Result Value Ref Range Status   Campylobacter species NOT DETECTED NOT DETECTED Final   Plesimonas shigelloides NOT DETECTED NOT DETECTED Final   Salmonella species NOT DETECTED NOT DETECTED Final   Yersinia enterocolitica NOT DETECTED NOT DETECTED Final   Vibrio species NOT DETECTED NOT DETECTED Final   Vibrio cholerae NOT DETECTED NOT DETECTED Final   Enteroaggregative E coli (EAEC) NOT DETECTED NOT DETECTED Final   Enteropathogenic E coli (EPEC) NOT DETECTED NOT DETECTED Final   Enterotoxigenic E coli (ETEC) NOT DETECTED NOT DETECTED Final   Shiga like toxin producing E coli (STEC) NOT DETECTED NOT DETECTED Final   Shigella/Enteroinvasive E coli (EIEC) NOT DETECTED NOT DETECTED Final   Cryptosporidium NOT DETECTED NOT DETECTED Final   Cyclospora cayetanensis NOT DETECTED NOT DETECTED Final   Entamoeba histolytica NOT DETECTED NOT DETECTED Final   Giardia lamblia NOT DETECTED NOT DETECTED Final   Adenovirus F40/41 NOT DETECTED NOT DETECTED Final   Astrovirus NOT DETECTED NOT DETECTED Final   Norovirus GI/GII NOT DETECTED NOT DETECTED Final   Rotavirus A NOT DETECTED NOT DETECTED Final   Sapovirus (I, II, IV,  and V) NOT DETECTED NOT DETECTED Final    Comment: Performed at [facility], [address] [place], [place] [address]     Labs:  CBC: Recent Labs  Lab 06/10/23 0933 06/11/23 0651 06/12/23 0324  WBC 4.6 5.9 8.0  HGB 11.0* 10.6* 10.8*  HCT 31.6* 32.5* 32.5*  MCV 88.5 93.1 92.9  PLT 177 180 170   BMP &GFR Recent Labs  Lab 06/10/23 0933 06/10/23 2258 06/11/23 0651 06/12/23 0324  NA 123* 132* 134* 134*  K 3.7 4.3 3.8 4.8  CL 90* 99 97* 100  CO2 29 20* 28 26  GLUCOSE 134* 258* 163* 276*  BUN 11 12 11 14   CREATININE 0.74 0.75 0.60 0.53  CALCIUM  8.9 8.9 8.8* 9.3   Estimated Creatinine Clearance: 59.2 mL/min (by C-G formula based on SCr of 0.53 mg/dL). Liver & Pancreas: Recent Labs  Lab 06/10/23 0933  AST 24  ALT 14  ALKPHOS 81  BILITOT  0.4  PROT 6.8  ALBUMIN  3.9   No results for input(s): LIPASE, AMYLASE in the last 168 hours. No results for input(s): AMMONIA in the last 168 hours. Diabetic: No results for input(s): HGBA1C in the last 72 hours. Recent Labs  Lab 06/11/23 1147 06/11/23 1632 06/11/23 2111 06/12/23 0728 06/12/23 1142  GLUCAP 232* 327* 338* 262* 207*   Cardiac Enzymes: No results for input(s): CKTOTAL, CKMB, CKMBINDEX, TROPONINI in the last 168 hours. No results for input(s): PROBNP in the last 8760 hours. Coagulation Profile: No results for input(s): INR, PROTIME in the last 168 hours. Thyroid  Function Tests: No results for input(s): TSH, T4TOTAL, FREET4, T3FREE, THYROIDAB in the last 72 hours. Lipid Profile: No results for input(s): CHOL, HDL, LDLCALC, TRIG, CHOLHDL, LDLDIRECT in the last 72 hours. Anemia Panel: No results for input(s): VITAMINB12, FOLATE, FERRITIN, TIBC, IRON, RETICCTPCT in the last 72 hours. Urine analysis:    Component Value Date/Time   COLORURINE YELLOW 05/14/2023 1125   APPEARANCEUR CLEAR 05/14/2023 1125   APPEARANCEUR Clear 04/21/2023 1428    LABSPEC 1.005 05/14/2023 1125   PHURINE 6.0 05/14/2023 1125   GLUCOSEU NEGATIVE 05/14/2023 1125   HGBUR NEGATIVE 05/14/2023 1125   BILIRUBINUR NEGATIVE 05/14/2023 1125   BILIRUBINUR Negative 04/21/2023 1428   KETONESUR NEGATIVE 05/14/2023 1125   PROTEINUR NEGATIVE 05/14/2023 1125   UROBILINOGEN 0.2 07/23/2015 1350   NITRITE NEGATIVE 05/14/2023 1125   LEUKOCYTESUR NEGATIVE 05/14/2023 1125   Sepsis Labs: Invalid input(s): PROCALCITONIN, LACTICIDVEN   SIGNED:  Nichola Cieslinski T Patryce Depriest, MD  [facility] 06/12/2023, 4:49 PM

## 2023-06-12 NOTE — Plan of Care (Signed)
  Problem: Education: Goal: Knowledge of General Education information will improve Description: Including pain rating scale, medication(s)/side effects and non-pharmacologic comfort measures Outcome: Progressing   Problem: Clinical Measurements: Goal: Respiratory complications will improve Outcome: Progressing   Problem: Activity: Goal: Risk for activity intolerance will decrease Outcome: Progressing   Problem: Pain Management: Goal: General experience of comfort will improve Outcome: Progressing   Problem: Safety: Goal: Ability to remain free from injury will improve Outcome: Progressing

## 2023-06-12 NOTE — TOC Transition Note (Addendum)
 Transition of Care [facility]) - Discharge Note   Patient Details  Name: Amber Stephenson MRN: [id number] Date of Birth: Jan 23, 1946  Transition of [facility]) CM/SW Contact:  NORMAN ASPEN, LCSW Phone Number: 06/12/2023, 11:01 AM   Clinical Narrative:     Pt medically cleared for dc home today.  HHPT/OT has been recommended and pt is agreeable - request this be arranged with [facility].  Have placed referral with [facility] and contact info placed on AVS.  No further TOC needs.  Pt confirming she has Oxygen  supplies in the home already - provided by [employer].    Final next level of care: Home w Home Health Services Barriers to Discharge: No Barriers Identified   Patient Goals and CMS Choice Patient states their goals for this hospitalization and ongoing recovery are:: return home          Discharge Placement                       Discharge Plan and Services Additional resources added to the After Visit Summary for                  DME Arranged: N/A DME Agency: NA       HH Arranged: PT, OT HH Agency: [facility] Date [facility] Agency Contacted: 06/12/23 Time HH Agency Contacted: 1101 Representative spoke with at [facility] Agency: Darleene  Social Drivers of Health (SDOH) Interventions SDOH Screenings   Food Insecurity: No Food Insecurity (06/10/2023)  Housing: Low Risk  (06/10/2023)  Transportation Needs: No Transportation Needs (06/10/2023)  Utilities: Not At Risk (06/10/2023)  Alcohol  Screen: Low Risk  (01/23/2023)  Depression (PHQ2-9): Low Risk  (06/05/2023)  Financial Resource Strain: Low Risk  (01/23/2023)  Physical Activity: Inactive (01/23/2023)  Social Connections: Unknown (06/10/2023)  Stress: No Stress Concern Present (01/23/2023)  Tobacco Use: Low Risk  (06/10/2023)  Health Literacy: Adequate Health Literacy (01/23/2023)     Readmission Risk Interventions    06/12/2023   11:00 AM 01/13/2023   10:06 AM 11/11/2022    4:01 PM  Readmission Risk Prevention Plan  Transportation  Screening Complete Complete Complete  Medication Review Oceanographer) Complete  Complete  PCP or Specialist appointment within 3-5 days of discharge Complete    HRI or Home Care Consult Complete  Complete  SW Recovery Care/Counseling Consult Complete  Complete  Palliative Care Screening Not Applicable  Not Applicable  Skilled Nursing Facility Not Applicable  Patient Refused

## 2023-06-12 NOTE — Plan of Care (Signed)
  Problem: Education: Goal: Knowledge of General Education information will improve Description: Including pain rating scale, medication(s)/side effects and non-pharmacologic comfort measures Outcome: Adequate for Discharge   Problem: Health Behavior/Discharge Planning: Goal: Ability to manage health-related needs will improve Outcome: Adequate for Discharge   Problem: Clinical Measurements: Goal: Ability to maintain clinical measurements within normal limits will improve Outcome: Adequate for Discharge Goal: Will remain free from infection Outcome: Adequate for Discharge Goal: Diagnostic test results will improve 06/12/2023 1201 by Deette Radford A, LPN Outcome: Adequate for Discharge 06/12/2023 1037 by Deette Radford A, LPN Outcome: Progressing Goal: Respiratory complications will improve 06/12/2023 1201 by Deette Radford A, LPN Outcome: Adequate for Discharge 06/12/2023 1037 by Deette Radford A, LPN Outcome: Progressing Goal: Cardiovascular complication will be avoided Outcome: Adequate for Discharge   Problem: Activity: Goal: Risk for activity intolerance will decrease 06/12/2023 1201 by Deette Radford A, LPN Outcome: Adequate for Discharge 06/12/2023 1037 by Deette Radford A, LPN Outcome: Progressing   Problem: Nutrition: Goal: Adequate nutrition will be maintained Outcome: Adequate for Discharge   Problem: Coping: Goal: Level of anxiety will decrease Outcome: Adequate for Discharge   Problem: Elimination: Goal: Will not experience complications related to bowel motility Outcome: Adequate for Discharge Goal: Will not experience complications related to urinary retention Outcome: Adequate for Discharge   Problem: Pain Management: Goal: General experience of comfort will improve Outcome: Adequate for Discharge   Problem: Safety: Goal: Ability to remain free from injury will improve Outcome: Adequate for Discharge   Problem: Skin Integrity: Goal: Risk for impaired  skin integrity will decrease Outcome: Adequate for Discharge   Problem: Education: Goal: Ability to describe self-care measures that may prevent or decrease complications (Diabetes Survival Skills Education) will improve Outcome: Adequate for Discharge Goal: Individualized Educational Video(s) Outcome: Adequate for Discharge   Problem: Coping: Goal: Ability to adjust to condition or change in health will improve Outcome: Adequate for Discharge   Problem: Fluid Volume: Goal: Ability to maintain a balanced intake and output will improve Outcome: Adequate for Discharge   Problem: Health Behavior/Discharge Planning: Goal: Ability to identify and utilize available resources and services will improve Outcome: Adequate for Discharge Goal: Ability to manage health-related needs will improve Outcome: Adequate for Discharge   Problem: Metabolic: Goal: Ability to maintain appropriate glucose levels will improve Outcome: Adequate for Discharge   Problem: Nutritional: Goal: Maintenance of adequate nutrition will improve Outcome: Adequate for Discharge Goal: Progress toward achieving an optimal weight will improve Outcome: Adequate for Discharge   Problem: Skin Integrity: Goal: Risk for impaired skin integrity will decrease Outcome: Adequate for Discharge   Problem: Tissue Perfusion: Goal: Adequacy of tissue perfusion will improve Outcome: Adequate for Discharge

## 2023-06-12 NOTE — Progress Notes (Signed)
 Mobility Specialist - Progress Note   06/12/23 1102  Oxygen  Therapy  SpO2 (!) 87 %  O2 Device Room Air  Patient Activity (if Appropriate) Ambulating  Mobility  Activity Ambulated with assistance in hallway  Level of Assistance Modified independent, requires aide device or extra time  Assistive Device Front wheel walker  Distance Ambulated (ft) 80 ft  Activity Response Tolerated well  Mobility Referral Yes  Mobility visit 1 Mobility  Mobility Specialist Start Time (ACUTE ONLY) 1043  Mobility Specialist Stop Time (ACUTE ONLY) 1053  Mobility Specialist Time Calculation (min) (ACUTE ONLY) 10 min   Nurse requested Mobility Specialist to perform oxygen  saturation test with pt which includes removing pt from oxygen  both at rest and while ambulating.  Below are the results from that testing.     Patient Saturations on Room Air at Rest = spO2 96%  Patient Saturations on Room Air while Ambulating = sp02 87% .   Patient Saturations on 2 Liters of oxygen  while Ambulating = sp02 91%  At end of testing pt left in room on 2  Liters of oxygen .  Reported results to nurse.  Pt received in bed and agreeable to mobility. No complaints during session. Pt to bed after session with all needs met.    Pre-mobility: 96% SpO2 (RA) During mobility: 87% SpO2 (RA) Post-mobility: 91% SPO2 (2L [place])  Chief Technology Officer

## 2023-06-12 NOTE — Telephone Encounter (Signed)
 Pt ready for scheduling for PROLIA  on or after : 06/12/23  Out-of-pocket cost due at time of visit: $0  Number of injection/visits approved: ---  Primary: MEDICARE Prolia  co-insurance: 20% Admin fee co-insurance: 20%  Secondary: TRICARE Prolia  co-insurance: Covers co-insurance and deductible Admin fee co-insurance:   Medical Benefit Details: Date Benefits were checked: 06/12/23 Deductible: $0 Met of $257 Required/ Coinsurance: 20%/ Admin Fee: 20%  Prior Auth: N/A PA# Expiration Date:   # of doses approved:  Pharmacy benefit: Copay $--- If patient wants fill through the pharmacy benefit please send prescription to:  --- , and include estimated need by date in rx notes. Pharmacy will ship medication directly to the office.  Patient NOT eligible for Prolia  Copay Card. Copay Card can make patient's cost as little as $25. Link to apply: [email or web address]  ** This summary of benefits is an estimation of the patient's out-of-pocket cost. Exact cost may very based on individual plan coverage.

## 2023-06-15 ENCOUNTER — Telehealth: Payer: Self-pay | Admitting: *Deleted

## 2023-06-15 ENCOUNTER — Encounter: Payer: Self-pay | Admitting: Family Medicine

## 2023-06-15 ENCOUNTER — Telehealth: Payer: Self-pay | Admitting: Family Medicine

## 2023-06-15 ENCOUNTER — Ambulatory Visit (INDEPENDENT_AMBULATORY_CARE_PROVIDER_SITE_OTHER): Payer: Medicare Other | Admitting: Family Medicine

## 2023-06-15 VITALS — BP 124/75 | HR 108 | Temp 97.7°F | Ht 62.0 in | Wt 184.4 lb

## 2023-06-15 DIAGNOSIS — I10 Essential (primary) hypertension: Secondary | ICD-10-CM

## 2023-06-15 DIAGNOSIS — R0602 Shortness of breath: Secondary | ICD-10-CM | POA: Diagnosis not present

## 2023-06-15 DIAGNOSIS — R051 Acute cough: Secondary | ICD-10-CM | POA: Diagnosis not present

## 2023-06-15 DIAGNOSIS — E782 Mixed hyperlipidemia: Secondary | ICD-10-CM | POA: Diagnosis not present

## 2023-06-15 DIAGNOSIS — J121 Respiratory syncytial virus pneumonia: Secondary | ICD-10-CM

## 2023-06-15 DIAGNOSIS — Z794 Long term (current) use of insulin: Secondary | ICD-10-CM | POA: Diagnosis not present

## 2023-06-15 DIAGNOSIS — E119 Type 2 diabetes mellitus without complications: Secondary | ICD-10-CM

## 2023-06-15 LAB — LIPID PANEL

## 2023-06-15 LAB — BAYER DCA HB A1C WAIVED: HB A1C (BAYER DCA - WAIVED): 6.8 % — ABNORMAL HIGH (ref 4.8–5.6)

## 2023-06-15 MED ORDER — ALBUTEROL SULFATE HFA 108 (90 BASE) MCG/ACT IN AERS: 2.0000 | INHALATION_SPRAY | RESPIRATORY_TRACT | 11 refills | Status: DC | PRN

## 2023-06-15 MED ORDER — FLUCONAZOLE 100 MG PO TABS: ORAL_TABLET | ORAL | 0 refills | Status: DC

## 2023-06-15 MED ORDER — PREDNISONE 20 MG PO TABS: 20.0000 mg | ORAL_TABLET | Freq: Two times a day (BID) | ORAL | 0 refills | Status: DC

## 2023-06-15 MED ORDER — PROMETHAZINE-DM 6.25-15 MG/5ML PO SYRP: 5.0000 mL | ORAL_SOLUTION | Freq: Four times a day (QID) | ORAL | 0 refills | Status: DC | PRN

## 2023-06-15 NOTE — Transitions of Care (Post Inpatient/ED Visit) (Signed)
   06/15/2023  Name: JALEEYA MCNELLY MRN: [id number] DOB: December 16, 1945  Today's TOC FU Call Status: Today's TOC FU Call Status:: Unsuccessful Call (1st Attempt) Unsuccessful Call (1st Attempt) Date: 06/15/23  Attempted to reach the patient regarding the most recent Inpatient/ED visit.  Follow Up Plan: Additional outreach attempts will be made to reach the patient to complete the Transitions of Care (Post Inpatient/ED visit) call.   Mliss Creed [facility], BSN RN Care Manager/ Transition of Care [place]/ [facility] [phone]

## 2023-06-15 NOTE — Progress Notes (Signed)
 Subjective:  Patient ID: Amber Stephenson, female    DOB: Sep 30, 1945  Age: 78 y.o. MRN: [id number]  CC: Medical Management of Chronic Issues   HPI DEINA LIPSEY presents forFollow-up of diabetes. Patient checks blood sugar at home.  Patient denies symptoms such as polyuria, polydipsia, excessive hunger, nausea No significant hypoglycemic spells noted. Medications reviewed. Pt reports taking them regularly without complication/adverse reaction being reported today.  DX with pneumonia. Spent 1/8 to 1/10 hospitalized for pneumonia. Found to have RSV.  Reports she still has some wheezing cough and dyspnea.  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), Benign paroxysmal positional vertigo due to bilateral vestibular disorder (05/20/2019), Bipolar 1 disorder (HCC) (01/23/2015), BPPV (benign paroxysmal positional vertigo) (05/20/2019), CAD (coronary artery disease), Cardiomegaly (01/12/2018), CHF (congestive heart failure) (06/08/2022), Chronic back pain (01/04/2015), Chronic constipation (04/25/2020), Chronic diastolic heart failure (HCC) (87/71/7983), Chronic pain syndrome (08/22/2019), Chronic pain syndrome (08/22/2019), Chronic post-traumatic stress disorder (PTSD) (12/06/2020), Diabetic neuropathy (HCC) (02/06/2016), Dyslipidemia (09/24/2020), Dyspnea on exertion, Essential hypertension, Family history of coronary arteriosclerosis (05/30/2015), Functional diarrhea (10/26/2020), Head trauma (09/17/2020), Hemorrhoids (03/11/2022), Herpes genitalis in women (07/16/2015), History of adenomatous polyp of colon (05/21/2019), History of radiation therapy, Insomnia (01/23/2015), Lipoma  (02/08/2015), Migraine headache with aura (02/12/2016), Myofascial pain dysfunction syndrome (08/22/2019), Myofascial pain dysfunction syndrome (08/22/2019), Non-alcoholic fatty liver disease (91/86/7980), Opioid dependence (HCC) (12/06/2020), OSA (obstructive sleep apnea) (02/24/2019), Osteopenia (12/06/2020), Pinguecula (12/06/2020), Postcoital bleeding (12/06/2020), Presbyopia (12/06/2020), Pulmonary hypertension, RLS (restless legs syndrome) (04/27/2015), Superficial fungus infection of skin (09/03/2021), Tremor, essential (12/11/2021), and Type II diabetes mellitus (HCC) (09/24/2020).   She has a past surgical history that includes THIGH SURGERY; Shoulder surgery (Right); Breast reduction surgery; Eye surgery (Right); Hammer toe surgery; LEFT HEART CATH AND CORONARY ANGIOGRAPHY (N/A, 02/03/2018); and Reverse shoulder arthroplasty (Right, 08/19/2019).   Her family history includes Alcohol  abuse in her sister; Alzheimer's disease in her father; Diabetes in her brother, mother, and sister; Heart disease in her brother, mother, and sister; Mental illness in her brother; Stroke in her brother.She reports that she has never smoked. She has never used smokeless tobacco. She reports that she does not currently use alcohol . She reports that she does not use drugs.  Current Outpatient Medications on File Prior to Visit  Medication Sig Dispense Refill   allopurinol  (ZYLOPRIM ) 100 MG tablet Take 100 mg by mouth daily.     apixaban  (ELIQUIS ) 5 MG TABS tablet Take 1 tablet (5 mg total) by mouth 2 (two) times daily. 180 tablet 3   ARIPiprazole  (ABILIFY ) 2 MG tablet Take 2 mg by mouth at bedtime.     clotrimazole  (MYCELEX ) 10 MG troche Take 10 mg by mouth in the morning and at bedtime.     cyanocobalamin  (VITAMIN B12) 1000 MCG tablet Take 1 tablet (1,000 mcg total) by mouth daily. 30 tablet 1   denosumab  (PROLIA ) 60 MG/ML SOSY injection Inject 60 mg into the skin every 6 (six) months. 1 mL 1   doxepin  (SINEQUAN ) 10  MG capsule Take 10 mg by mouth at bedtime.     escitalopram  (LEXAPRO ) 10 MG tablet Take 10 mg by mouth daily.     fluticasone -salmeterol (ADVAIR DISKUS) 100-50 MCG/ACT AEPB Inhale 1 puff into the lungs 2 (two) times daily. (Patient taking differently: Inhale 1  puff into the lungs 2 (two) times daily as needed (for flares).) 1 each 5   Galcanezumab -gnlm (EMGALITY ) 120 MG/ML SOAJ INJECT CONTENTS ON 1 PEN(120MG ) INTO THE SKIN ONCE MONTHLY (Patient taking differently: Inject 120 mg into the skin every 30 (thirty) days.) 1 mL 3   Insulin  Disposable Pump (OMNIPOD 5 G7 INTRO, GEN 5,) KIT 1 Device by Does not apply route every other day. 1 kit 0   Insulin  Disposable Pump (OMNIPOD 5 G7 PODS, GEN 5,) MISC 1 Device by Does not apply route every other day. 45 each 3   insulin  glargine (LANTUS  SOLOSTAR) 100 UNIT/ML Solostar Pen Inject 32 Units into the skin 2 (two) times daily. 60 mL 4   insulin  lispro (HUMALOG  KWIKPEN) 100 UNIT/ML KwikPen Max daily 45 units (Patient taking differently: Inject 6-45 Units into the skin 3 (three) times daily. Max daily 45 units) 45 mL 6   Insulin  Pen Needle 29G X MISC 1 Device by Does not apply route daily in the afternoon. 400 each 3   levocetirizine (XYZAL  ALLERGY 24HR) 5 MG tablet Take 1 tablet (5 mg total) by mouth every evening. For itch     lidocaine  (XYLOCAINE ) 2 % solution Use as directed 15 mLs in the mouth or throat every 4 (four) hours as needed for mouth pain. 100 mL 5   LORazepam  (ATIVAN ) 0.5 MG tablet Take 0.5-1 mg by mouth See admin instructions. Take 0.5 mg by mouth in the morning and 1 mg at bedtime     losartan  (COZAAR ) 25 MG tablet Take 25 mg by mouth daily.     meclizine  (ANTIVERT ) 25 MG tablet Take 1 tablet (25 mg total) by mouth 3 (three) times daily as needed for dizziness. 60 tablet 1   metFORMIN  (GLUCOPHAGE -XR) 500 MG 24 hr tablet Take 2 tablets (1,000 mg total) by mouth daily with breakfast. 180 tablet 3   methocarbamol  1000 MG TABS Take 1,000 mg by  mouth every 6 (six) hours as needed for muscle spasms. 120 tablet 5   metoprolol  tartrate (LOPRESSOR ) 50 MG tablet Take 1 tablet (50 mg total) by mouth 2 (two) times daily. 180 tablet 2   naloxone  (NARCAN ) nasal spray 4 mg/0.1 mL Place 1 spray into the nose once as needed (AS DIRECTED).     nystatin  (MYCOSTATIN /NYSTOP ) powder Apply 1 Application topically as needed (rash). 15 g 2   nystatin  ointment (MYCOSTATIN ) Apply 1 Application topically 2 (two) times daily as needed (for rashes). 30 g 1   omeprazole  (PRILOSEC) 40 MG capsule Take 40 mg by mouth daily.     ondansetron  (ZOFRAN ) 4 MG tablet Take 1 tablet (4 mg total) by mouth every 8 (eight) hours as needed for nausea or vomiting. For cancer related nausea 90 tablet 5   OXcarbazepine  (TRILEPTAL ) 150 MG tablet Take 1 tablet (150 mg total) by mouth 2 (two) times daily. 60 tablet 0   Oxycodone  HCl 20 MG TABS Take 1 tablet by mouth 2 (two) times daily as needed.     pantoprazole  (PROTONIX ) 40 MG tablet Take 40 mg by mouth daily as needed (for reflux).     pregabalin  (LYRICA ) 200 MG capsule Take 200 mg by mouth 2 (two) times daily.     promethazine  (PHENERGAN ) 25 MG tablet Take 0.5-1 tablets (12.5-25 mg total) by mouth every 6 (six) hours as needed for refractory nausea / vomiting (cancer related nausea/vomiting). 120 tablet 5   rOPINIRole  (REQUIP ) 1 MG tablet Take 1 tablet (1 mg total) by mouth  at bedtime. For leg cramps 30 tablet 5   senna-docusate (SENOKOT-S) 8.6-50 MG tablet Take 1 tablet by mouth at bedtime. (Patient taking differently: Take 1 tablet by mouth at bedtime as needed (for constipation).) 30 tablet 0   tirzepatide  (MOUNJARO ) 7.5 MG/0.5ML Pen Inject 7.5 mg into the skin once a week. 6 mL 3   TYLENOL  500 MG tablet Take 500-1,000 mg by mouth every 6 (six) hours as needed for mild pain (pain score 1-3) or headache.     Vitamin D , Cholecalciferol , 25 MCG (1000 UT) TABS Take 1,000 Units by mouth daily in the afternoon.     atorvastatin   (LIPITOR) 80 MG tablet Take 1 tablet (80 mg total) by mouth daily. 90 tablet 3   potassium chloride  (KLOR-CON ) 10 MEQ tablet Take 1 tablet (10 mEq total) by mouth daily. 90 tablet 3   spironolactone  (ALDACTONE ) 25 MG tablet Take 0.5 tablets (12.5 mg total) by mouth daily. 15 tablet 2   Current Facility-Administered Medications on File Prior to Visit  Medication Dose Route Frequency Provider Last Rate Last Admin   bupivacaine -epinephrine  (MARCAINE  W/ EPI) 0.5% -1:200000 injection    Anesthesia Intra-op Hollis, Kevin D, MD   12 mL at 01/21/19 0935   denosumab  (PROLIA ) injection 60 mg  60 mg Subcutaneous Once Nasya Vincent, MD        ROS Review of Systems  Constitutional: Negative.  Negative for activity change, appetite change, chills and fever.  HENT:  Positive for congestion, postnasal drip, rhinorrhea and sore throat. Negative for ear discharge, ear pain, hearing loss, nosebleeds, sneezing and trouble swallowing.   Eyes:  Negative for visual disturbance.  Respiratory:  Positive for cough, shortness of breath and wheezing. Negative for chest tightness.   Cardiovascular:  Negative for chest pain and palpitations.  Gastrointestinal:  Negative for abdominal pain, constipation, diarrhea, nausea and vomiting.  Genitourinary:  Negative for difficulty urinating.  Musculoskeletal:  Negative for arthralgias and myalgias.  Skin:  Negative for rash.  Neurological:  Negative for headaches.  Psychiatric/Behavioral:  Negative for sleep disturbance.     Objective:  BP 124/75   Pulse (!) 108   Temp 97.7 F (36.5 C)   Ht 5' 2 (1.575 m)   Wt 184 lb 6.4 oz (83.6 kg)   SpO2 92%   BMI 33.73 kg/m   BP Readings from Last 3 Encounters:  06/15/23 124/75  06/12/23 (!) 163/103  06/08/23 125/84    Wt Readings from Last 3 Encounters:  06/15/23 184 lb 6.4 oz (83.6 kg)  06/10/23 180 lb (81.6 kg)  06/05/23 181 lb (82.1 kg)     Physical Exam Constitutional:      General: She is not in acute  distress.    Appearance: She is well-developed. She is ill-appearing.  HENT:     Head: Normocephalic and atraumatic.  Eyes:     Conjunctiva/sclera: Conjunctivae normal.     Pupils: Pupils are equal, round, and reactive to light.  Neck:     Thyroid : No thyromegaly.  Cardiovascular:     Rate and Rhythm: Normal rate and regular rhythm.     Heart sounds: Normal heart sounds. No murmur heard. Pulmonary:     Effort: Pulmonary effort is normal. No respiratory distress.     Breath sounds: Wheezing present. No rales.  Abdominal:     General: Bowel sounds are normal. There is no distension.     Palpations: Abdomen is soft.     Tenderness: There is no abdominal tenderness.  Musculoskeletal:  General: Normal range of motion.     Cervical back: Normal range of motion and neck supple.  Lymphadenopathy:     Cervical: No cervical adenopathy.  Skin:    General: Skin is warm and dry.  Neurological:     Mental Status: She is alert and oriented to person, place, and time.  Psychiatric:        Behavior: Behavior normal.        Thought Content: Thought content normal.        Judgment: Judgment normal.       Assessment & Plan:   Laruen was seen today for medical management of chronic issues.  Diagnoses and all orders for this visit:  Essential hypertension -     CBC with Differential/Platelet -     CMP14+EGFR  Mixed hyperlipidemia -     Lipid panel  Insulin  dependent type 2 diabetes mellitus (HCC) -     Bayer DCA Hb A1c Waived  Pneumonia of right upper lobe due to infectious organism -     promethazine -dextromethorphan  (PROMETHAZINE -DM) 6.25-15 MG/5ML syrup; Take 5 mLs by mouth 4 (four) times daily as needed for cough.  Acute cough -     promethazine -dextromethorphan  (PROMETHAZINE -DM) 6.25-15 MG/5ML syrup; Take 5 mLs by mouth 4 (four) times daily as needed for cough.  SOB (shortness of breath) -     promethazine -dextromethorphan  (PROMETHAZINE -DM) 6.25-15 MG/5ML syrup; Take  5 mLs by mouth 4 (four) times daily as needed for cough.  Other orders -     fluconazole  (DIFLUCAN ) 100 MG tablet; Take two with first dose. Then starting the next day take one daily until all are taken. -     predniSONE  (DELTASONE ) 20 MG tablet; Take 1 tablet (20 mg total) by mouth 2 (two) times daily with a meal. -     albuterol  (VENTOLIN  HFA) 108 (90 Base) MCG/ACT inhaler; Inhale 2 puffs into the lungs every 4 (four) hours as needed for wheezing or shortness of breath.      I have discontinued Kinzie D. Coppens's furosemide , hydrOXYzine , amoxicillin -clavulanate, and benzonatate . I have also changed her promethazine -dextromethorphan  and albuterol . Additionally, I am having her start on fluconazole  and predniSONE . Lastly, I am having her maintain her Vitamin D  (Cholecalciferol ), Prolia , naloxone , ARIPiprazole , senna-docusate, OXcarbazepine , nystatin , cyanocobalamin , apixaban , fluticasone -salmeterol, allopurinol , doxepin , metoprolol  tartrate, pregabalin , escitalopram , Emgality , TYLENOL , LORazepam , pantoprazole , spironolactone , atorvastatin , potassium chloride , Omnipod 5 G7 Pods (Gen 5), Omnipod 5 G7 Intro (Gen 5), Methocarbamol , ondansetron , promethazine , nystatin  ointment, rOPINIRole , levocetirizine, losartan , Oxycodone  HCl, lidocaine , tirzepatide , metFORMIN , Insulin  Pen Needle, insulin  lispro, Lantus  SoloStar, meclizine , clotrimazole , and omeprazole . We will continue to administer denosumab .  Meds ordered this encounter  Medications   fluconazole  (DIFLUCAN ) 100 MG tablet    Sig: Take two with first dose. Then starting the next day take one daily until all are taken.    Dispense:  15 tablet    Refill:  0   promethazine -dextromethorphan  (PROMETHAZINE -DM) 6.25-15 MG/5ML syrup    Sig: Take 5 mLs by mouth 4 (four) times daily as needed for cough.    Dispense:  180 mL    Refill:  0   predniSONE  (DELTASONE ) 20 MG tablet    Sig: Take 1 tablet (20 mg total) by mouth 2 (two) times daily with a meal.     Dispense:  10 tablet    Refill:  0   albuterol  (VENTOLIN  HFA) 108 (90 Base) MCG/ACT inhaler    Sig: Inhale 2 puffs into the lungs every 4 (four) hours as needed for  wheezing or shortness of breath.    Dispense:  1 each    Refill:  11     Follow-up: No follow-ups on file.  Butler Der, M.D.

## 2023-06-15 NOTE — Telephone Encounter (Signed)
 Pt has apt 06/15/2023 with Dr Zollie  Copied from CRM [id number]. Topic: Appointments - Scheduling Inquiry for Clinic >> Jun 15, 2023  9:56 AM Kinnie DEL wrote: Patient/patient representative is calling to schedule an appointment. Refer to attachments for appointment information.

## 2023-06-16 ENCOUNTER — Telehealth: Payer: Self-pay | Admitting: Family Medicine

## 2023-06-16 ENCOUNTER — Telehealth: Payer: Self-pay | Admitting: *Deleted

## 2023-06-16 ENCOUNTER — Encounter: Payer: Self-pay | Admitting: *Deleted

## 2023-06-16 LAB — CBC WITH DIFFERENTIAL/PLATELET
Basophils Absolute: 0 10*3/uL (ref 0.0–0.2)
Basos: 0 %
EOS (ABSOLUTE): 0.1 10*3/uL (ref 0.0–0.4)
Eos: 2 %
Hematocrit: 33.7 % — ABNORMAL LOW (ref 34.0–46.6)
Hemoglobin: 11.1 g/dL (ref 11.1–15.9)
Immature Grans (Abs): 0.3 10*3/uL — ABNORMAL HIGH (ref 0.0–0.1)
Immature Granulocytes: 3 %
Lymphocytes Absolute: 0.5 10*3/uL — ABNORMAL LOW (ref 0.7–3.1)
Lymphs: 5 %
MCH: 30.8 pg (ref 26.6–33.0)
MCHC: 32.9 g/dL (ref 31.5–35.7)
MCV: 94 fL (ref 79–97)
Monocytes Absolute: 0.5 10*3/uL (ref 0.1–0.9)
Monocytes: 5 %
NRBC: 1 % — ABNORMAL HIGH (ref 0–0)
Neutrophils Absolute: 8 10*3/uL — ABNORMAL HIGH (ref 1.4–7.0)
Neutrophils: 85 %
Platelets: 197 10*3/uL (ref 150–450)
RBC: 3.6 x10E6/uL — ABNORMAL LOW (ref 3.77–5.28)
RDW: 15.1 % (ref 11.7–15.4)
WBC: 9.5 10*3/uL (ref 3.4–10.8)

## 2023-06-16 LAB — CMP14+EGFR
ALT: 17 IU/L (ref 0–32)
AST: 12 IU/L (ref 0–40)
Albumin: 3.6 g/dL — ABNORMAL LOW (ref 3.8–4.8)
Alkaline Phosphatase: 102 IU/L (ref 44–121)
BUN/Creatinine Ratio: 19 (ref 12–28)
BUN: 12 mg/dL (ref 8–27)
Bilirubin Total: 0.4 mg/dL (ref 0.0–1.2)
CO2: 24 mmol/L (ref 20–29)
Calcium: 8.4 mg/dL — ABNORMAL LOW (ref 8.7–10.3)
Chloride: 100 mmol/L (ref 96–106)
Creatinine, Ser: 0.63 mg/dL (ref 0.57–1.00)
Globulin, Total: 2.3 g/dL (ref 1.5–4.5)
Glucose: 208 mg/dL — ABNORMAL HIGH (ref 70–99)
Potassium: 4.1 mmol/L (ref 3.5–5.2)
Sodium: 136 mmol/L (ref 134–144)
Total Protein: 5.9 g/dL — ABNORMAL LOW (ref 6.0–8.5)
eGFR: 92 mL/min/{1.73_m2} (ref >59)

## 2023-06-16 LAB — LIPID PANEL
Cholesterol, Total: 127 mg/dL (ref 100–199)
HDL: 48 mg/dL (ref >39)
LDL CALC COMMENT:: 2.6 ratio (ref 0.0–4.4)
LDL Chol Calc (NIH): 45 mg/dL (ref 0–99)
Triglycerides: 214 mg/dL — ABNORMAL HIGH (ref 0–149)
VLDL Cholesterol Cal: 34 mg/dL (ref 5–40)

## 2023-06-16 NOTE — Telephone Encounter (Signed)
 Copied from CRM [id number]. Topic: Clinical - Home Health Verbal Orders >> Jun 16, 2023  2:46 PM Farrel B wrote: Reason for CRM: Jon from [facility] [phone] called to advise the provider that the patients start of care will be delayed until 06/19/2023 and OT will be delayed until 07/08/2023. If you have any questions please reach out to Ms. Angela

## 2023-06-16 NOTE — Transitions of Care (Post Inpatient/ED Visit) (Signed)
 06/16/2023  Name: Amber Stephenson MRN: [id number] DOB: 05/08/1946  Today's TOC FU Call Status: Today's TOC FU Call Status:: Successful TOC FU Call Completed TOC FU Call Complete Date: 06/16/23 Patient's Name and Date of Birth confirmed.  Transition Care Management Follow-up Telephone Call Date of Discharge: 06/12/23 Discharge Facility: [facility]) Type of Discharge: Inpatient Admission Primary Inpatient Discharge Diagnosis:: RSV How have you been since you were released from the hospital?: Same (pt states still has some wheezing/ coughing, afebrile, saw PCP 1/13 on prednisone , cough syrup, ambulating well although weak, eating fairly well,) Any questions or concerns?: Yes Patient Questions/Concerns:: when will home health PT be here? Patient Questions/Concerns Addressed: Other: Oceanographer notified HH)  Items Reviewed: Did you receive and understand the discharge instructions provided?: Yes Medications obtained,verified, and reconciled?: Yes (Medications Reviewed) Any new allergies since your discharge?: No Dietary orders reviewed?: Yes Type of Diet Ordered:: heart healthy Do you have support at home?: Yes People in Home: spouse Name of Support/Comfort Primary Source: [place] Patient consented to be enrolled in [facility] 30 day program   Goals Addressed             This Visit's Progress    Transition of Care/ pt will have no readmissions within 30 days       Current Barriers:  Home Health services [facility] has not contacted pt. Knowledge Deficits related to plan of care for management of RSV  Patient reports saw PCP 06/15/23 and states  not feeling much better, still have cough/ wheezing, pt prescribed prednisone  yesterday and taking cough syrup, pt is afebrile, pt using oxygen  at 2 liters hs and prn Patient states home health has not contacted her yet  RNCM Clinical Goal(s):  Patient will work with the Care Management team over the next 30  days to address Transition of Care Barriers: Home Health services verbalize understanding of plan for management of RSV as evidenced by pt report, review of EMR and  through collaboration with RN Care manager, provider, and care team.   Interventions: Evaluation of current treatment plan related to  self management and patient's adherence to plan as established by provider RSV  (Status:  New goal. and Goal on track:  Yes.)  Short Term Goal Evaluation of current treatment plan related to  RSV ,  home health has not seen patient,   self-management and patient's adherence to plan as established by provider. Discussed plans with patient for ongoing care management follow up and provided patient with direct contact information for care management team Evaluation of current treatment plan related to RSV and patient's adherence to plan as established by provider Provided education to patient re: signs/ symptoms respiratory infection, reportable signs/ symptoms Reviewed medications with patient and discussed importance of taking as prescribed Assessed social determinant of health barriers Reviewed upcoming scheduled appointments Telephone call to [facility], spoke with Damien who reports home visit 06/20/23 (due to staffing shortage PT) and will call pt to set up a time, pt verbalizes understanding and has contact number for [facility] In basket to Luke Griffiths care manager to cancel her scheduled telephone appointment for 06/19/23 and informed pt is enrolled in [facility] 30 day program  Patient Goals/Self-Care Activities: Participate in Transition of Care Program/Attend [facility] scheduled calls Notify RN Care Manager of TOC call rescheduling needs Take all medications as prescribed Attend all scheduled provider appointments Call pharmacy for medication refills 3-7 days in advance of running out of medications Perform  all self care activities independently  Call provider office for new concerns or questions   [facility] [phone] will contact you to schedule a time for home visit on 06/20/23 Please contact your doctor for unresolved/ worsening symptoms- cough, wheezing, fever Call 911 for shortness of breath, chest pain Use oxygen  as prescribed  Follow Up Plan:  Telephone follow up appointment with care management team member scheduled for:  06/24/23 @ 10 am           Medications Reviewed Today: Medications Reviewed Today     Reviewed by Aura Mliss LABOR, RN (Registered Nurse) on 06/16/23 at 1358  Med List Status: <None>   Medication Order Taking? Sig Documenting Provider Last Dose Status Informant  albuterol  (VENTOLIN  HFA) 108 (90 Base) MCG/ACT inhaler [id number] Yes Inhale 2 puffs into the lungs every 4 (four) hours as needed for wheezing or shortness of breath. [name], MD Taking Active   allopurinol  (ZYLOPRIM ) 100 MG tablet [id number] Yes Take 100 mg by mouth daily. [provider] Taking Active Spouse/Significant Other, Pharmacy Records  apixaban  (ELIQUIS ) 5 MG TABS tablet [id number] Yes Take 1 tablet (5 mg total) by mouth 2 (two) times daily. Shlomo Wilbert SAUNDERS, MD Taking Active Spouse/Significant Other, Pharmacy Records  ARIPiprazole  (ABILIFY ) 2 MG tablet [id number] Yes Take 2 mg by mouth at bedtime. [provider] Taking Active Spouse/Significant Other, Pharmacy Records  atorvastatin  (LIPITOR) 80 MG tablet [id number]  Take 1 tablet (80 mg total) by mouth daily. Lucien Orren SAILOR, PA-C  Expired 06/11/23 2359 Spouse/Significant Other, Pharmacy Records  clotrimazole  (MYCELEX ) 10 MG troche [id number] Yes Take 10 mg by mouth in the morning and at bedtime. [provider] Taking Active Spouse/Significant Other, Pharmacy Records  cyanocobalamin  (VITAMIN B12) 1000 MCG tablet [id number] Yes Take 1 tablet (1,000 mcg total) by mouth daily. Raenelle Donalda HERO, MD Taking Active Spouse/Significant Other, Pharmacy Records  denosumab  (PROLIA ) 60 MG/ML SOSY injection  [id number] Yes Inject 60 mg into the skin every 6 (six) months. [name], MD Taking Active Spouse/Significant Other, Pharmacy Records  denosumab  (PROLIA ) injection 60 mg [id number]   [name], MD  Active   doxepin  (SINEQUAN ) 10 MG capsule [id number] Yes Take 10 mg by mouth at bedtime. [provider] Taking Active Spouse/Significant Other, Pharmacy Records  escitalopram  (LEXAPRO ) 10 MG tablet [id number] Yes Take 10 mg by mouth daily. [provider] Taking Active Spouse/Significant Other, Pharmacy Records  fluconazole  (DIFLUCAN ) 100 MG tablet [id number] Yes Take two with first dose. Then starting the next day take one daily until all are taken. [name], MD Taking Active   fluticasone -salmeterol (ADVAIR DISKUS) 100-50 MCG/ACT AEPB [id number] Yes Inhale 1 puff into the lungs 2 (two) times daily.  Patient taking differently: Inhale 1 puff into the lungs 2 (two) times daily as needed (for flares).   Orlie Madelin RAMAN, NP Taking Active Spouse/Significant Other, Pharmacy Records           Med Note MARISA, NATHANEL SAILOR [place] Jan 28, 2023  8:18 PM)    Galcanezumab -gnlm (EMGALITY ) 120 MG/ML EMMANUEL [id number] Yes INJECT CONTENTS ON 1 PEN(120MG ) INTO THE SKIN ONCE MONTHLY  Patient taking differently: Inject 120 mg into the skin every 30 (thirty) days.   Wertman, Sara E, PA-C Taking Active Spouse/Significant Other, Pharmacy Records  Insulin  Disposable Pump (OMNIPOD 5 G7 INTRO, GEN 5,) KIT [id number] No 1 Device by Does not apply route every other day.  Patient not taking: Reported on 06/16/2023   Shamleffer, Ibtehal Jaralla, MD  Not Taking Active Spouse/Significant Other, Pharmacy Records  Insulin  Disposable Pump (OMNIPOD 5 G7 PODS, GEN 5,) MISC [id number]  1 Device by Does not apply route every other day. Shamleffer, Donell Cardinal, MD  Active Spouse/Significant Other, Pharmacy Records  insulin  glargine (LANTUS  SOLOSTAR) 100 UNIT/ML Solostar Pen [id number] Yes Inject 32 Units into the skin 2  (two) times daily. Shamleffer, Ibtehal Jaralla, MD Taking Active Spouse/Significant Other, Pharmacy Records  insulin  lispro (HUMALOG  [facility]) 100 UNIT/ML KwikPen [id number] Yes Max daily 45 units  Patient taking differently: Inject 6-45 Units into the skin 3 (three) times daily. Max daily 45 units   Shamleffer, Ibtehal Jaralla, MD Taking Active Spouse/Significant Other, Pharmacy Records  Insulin  Pen Needle 29G X MISC [id number] Yes 1 Device by Does not apply route daily in the afternoon. Shamleffer, Donell Cardinal, MD Taking Active Spouse/Significant Other, Pharmacy Records  levocetirizine (XYZAL  ALLERGY 24HR) 5 MG tablet [id number] Yes Take 1 tablet (5 mg total) by mouth every evening. For itch [name], MD Taking Active Spouse/Significant Other, Pharmacy Records  lidocaine  (XYLOCAINE ) 2 % solution [id number] Yes Use as directed 15 mLs in the mouth or throat every 4 (four) hours as needed for mouth pain. [name], MD Taking Active Spouse/Significant Other, Pharmacy Records  LORazepam  (ATIVAN ) 0.5 MG tablet [id number] Yes Take 0.5-1 mg by mouth See admin instructions. Take 0.5 mg by mouth in the morning and 1 mg at bedtime [provider] Taking Active Spouse/Significant Other, Pharmacy Records  losartan  (COZAAR ) 25 MG tablet [id number] Yes Take 25 mg by mouth daily. [provider] Taking Active Spouse/Significant Other, Pharmacy Records  meclizine  (ANTIVERT ) 25 MG tablet [id number] Yes Take 1 tablet (25 mg total) by mouth 3 (three) times daily as needed for dizziness. Dettinger, Fonda LABOR, MD Taking Active Spouse/Significant Other, Pharmacy Records  metFORMIN  (GLUCOPHAGE -XR) 500 MG 24 hr tablet [id number] Yes Take 2 tablets (1,000 mg total) by mouth daily with breakfast. Shamleffer, Ibtehal Jaralla, MD Taking Active Spouse/Significant Other, Pharmacy Records  methocarbamol  1000 MG TABS [id number] Yes Take 1,000 mg by mouth every 6 (six) hours as needed for muscle spasms.  Lovorn, Megan, MD Taking Active Spouse/Significant Other, Pharmacy Records           Med Note CARLEEN, [facility] D   Thu Jun 11, 2023 11:10 AM)    metoprolol  tartrate (LOPRESSOR ) 50 MG tablet [id number] Yes Take 1 tablet (50 mg total) by mouth 2 (two) times daily. Shlomo Wilbert SAUNDERS, MD Taking Active Spouse/Significant Other, Pharmacy Records  naloxone  (NARCAN ) nasal spray 4 mg/0.1 mL [id number] Yes Place 1 spray into the nose once as needed (AS DIRECTED). [provider] Taking Active Spouse/Significant Other, Pharmacy Records           Med Note CARLEEN, [facility] D   Thu Jun 11, 2023 11:13 AM) unknown  nystatin  (MYCOSTATIN /NYSTOP ) powder [id number] Yes Apply 1 Application topically as needed (rash). Shamleffer, Donell Cardinal, MD Taking Active Spouse/Significant Other, Pharmacy Records           Med Note CARLEEN, [facility] D   Thu Jun 11, 2023 11:06 AM) Unknown*  nystatin  ointment (MYCOSTATIN ) [id number] No Apply 1 Application topically 2 (two) times daily as needed (for rashes).  Patient not taking: Reported on 06/16/2023   Signa Delon LABOR, NP Not Taking Active Spouse/Significant Other, Pharmacy Records           Med Note CARLEEN GEORGIANN JONETTA [place] Jun 11, 2023 11:06 AM) unknown  omeprazole  (PRILOSEC) 40 MG capsule [id number] Yes Take 40  mg by mouth daily. [provider] Taking Active Spouse/Significant Other, Pharmacy Records  ondansetron  (ZOFRAN ) 4 MG tablet [id number] Yes Take 1 tablet (4 mg total) by mouth every 8 (eight) hours as needed for nausea or vomiting. For cancer related nausea Lovorn, Megan, MD Taking Active Spouse/Significant Other, Pharmacy Records  OXcarbazepine  (TRILEPTAL ) 150 MG tablet [id number] Yes Take 1 tablet (150 mg total) by mouth 2 (two) times daily. Sherrill Cable [place], DO Taking Active Spouse/Significant Other, Pharmacy Records  Oxycodone  HCl 20 MG TABS [id number] Yes Take 1 tablet by mouth 2 (two) times daily as needed. [provider]  Taking Active Spouse/Significant Other, Pharmacy Records  pantoprazole  (PROTONIX ) 40 MG tablet [id number] Yes Take 40 mg by mouth daily as needed (for reflux). [provider] Taking Active Spouse/Significant Other, Pharmacy Records  potassium chloride  (KLOR-CON ) 10 MEQ tablet [id number]  Take 1 tablet (10 mEq total) by mouth daily. Lucien Orren SAILOR, PA-C  Expired 06/11/23 2359 Spouse/Significant Other, Pharmacy Records  predniSONE  (DELTASONE ) 20 MG tablet [id number] Yes Take 1 tablet (20 mg total) by mouth 2 (two) times daily with a meal. [name], MD Taking Active   pregabalin  (LYRICA ) 200 MG capsule [id number] Yes Take 200 mg by mouth 2 (two) times daily. [provider] Taking Active Spouse/Significant Other, Pharmacy Records           Med Note MARISA, NATHANEL SAILOR   Wed Jan 28, 2023  8:22 PM)    promethazine  (PHENERGAN ) 25 MG tablet [id number] Yes Take 0.5-1 tablets (12.5-25 mg total) by mouth every 6 (six) hours as needed for refractory nausea / vomiting (cancer related nausea/vomiting). Lovorn, Megan, MD Taking Active Spouse/Significant Other, Pharmacy Records  promethazine -dextromethorphan  (PROMETHAZINE -DM) 6.25-15 MG/5ML syrup [id number] Yes Take 5 mLs by mouth 4 (four) times daily as needed for cough. [name], MD Taking Active   rOPINIRole  (REQUIP ) 1 MG tablet [id number] Yes Take 1 tablet (1 mg total) by mouth at bedtime. For leg cramps [name], MD Taking Active Spouse/Significant Other, Pharmacy Records  senna-docusate (SENOKOT-S) 8.6-50 MG tablet [id number] Yes Take 1 tablet by mouth at bedtime.  Patient taking differently: Take 1 tablet by mouth at bedtime as needed (for constipation).   Sherrill Cable [place], DO Taking Active Spouse/Significant Other, Pharmacy Records           Med Note CARLEEN, [facility] D   Thu Jun 11, 2023 11:07 AM) unknown  spironolactone  (ALDACTONE ) 25 MG tablet [id number]  Take 0.5 tablets (12.5 mg total) by mouth daily. Briana Elgin LABOR, MD   Expired 06/11/23 2359 Spouse/Significant Other, Pharmacy Records           Med Note CARLEEN, [facility] D   Thu Jun 11, 2023 11:07 AM) Having issues getting a refill   tirzepatide  (MOUNJARO ) 7.5 MG/0.5ML Pen [id number] Yes Inject 7.5 mg into the skin once a week. Shamleffer, Donell Cardinal, MD Taking Active Spouse/Significant Other, Pharmacy Records  TYLENOL  500 MG tablet [id number] Yes Take 500-1,000 mg by mouth every 6 (six) hours as needed for mild pain (pain score 1-3) or headache. [provider] Taking Active Spouse/Significant Other, Pharmacy Records           Med Note CARLEEN, [facility] D   Thu Jun 11, 2023  9:11 AM) unknown  Vitamin D , Cholecalciferol , 25 MCG (1000 UT) TABS [id number] Yes Take 1,000 Units by mouth daily in the afternoon. [provider] Taking Active Spouse/Significant Other, Pharmacy Records  Med List Note Leonardo Suzen LITTIE Bishop 08/29/22 1018): [employer] insurance  Home Care and Equipment/Supplies: Were Home Health Services Ordered?: Yes Name of Home Health Agency:: [facility] Has Agency set up a time to come to your home?: No EMR reviewed for Home Health Orders: Orders present/patient has not received call (refer to CM for follow-up) (RN Care Manager spoke with Damien at [facility]- home visit scheduled for 06/20/23 due to shortage of physical therapists, they will call pt and set up a time, pt VU and [facility] contact #) Any new equipment or medical supplies ordered?: No  Functional Questionnaire: Do you need assistance with bathing/showering or dressing?: No Do you need assistance with meal preparation?: No Do you need assistance with eating?: No Do you have difficulty maintaining continence: No Do you need assistance with getting out of bed/getting out of a chair/moving?: No Do you have difficulty managing or taking your medications?: No  Follow up appointments reviewed: PCP Follow-up appointment confirmed?: Yes Date of  PCP follow-up appointment?: 06/15/23 Follow-up Provider: saw PCP 06/15/23 Specialist Hospital Follow-up appointment confirmed?: No Reason Specialist Follow-Up Not Confirmed: Patient has Specialist Provider Number and will Call for Appointment Do you need transportation to your follow-up appointment?: No Do you understand care options if your condition(s) worsen?: Yes-patient verbalized understanding  SDOH Interventions Today    Flowsheet Row Most Recent Value  SDOH Interventions   Food Insecurity Interventions Intervention Not Indicated  Housing Interventions Intervention Not Indicated  Transportation Interventions Intervention Not Indicated  Utilities Interventions Intervention Not Indicated       Mliss Creed [facility], BSN RN Care Manager/ Transition of Care [place]/ [facility] [phone]

## 2023-06-17 ENCOUNTER — Telehealth: Payer: Self-pay | Admitting: *Deleted

## 2023-06-17 ENCOUNTER — Telehealth: Payer: Self-pay

## 2023-06-17 NOTE — Telephone Encounter (Signed)
 Called patient to inform of MRI and fu, no answer will call later

## 2023-06-17 NOTE — Telephone Encounter (Signed)
 Patient aware.

## 2023-06-17 NOTE — Telephone Encounter (Signed)
 I just prescribed 2 days ago. She needs to give it several more days to work.

## 2023-06-17 NOTE — Telephone Encounter (Signed)
 She needs to give he medication a little longer. It was only prescribed 2 days ago.

## 2023-06-17 NOTE — Telephone Encounter (Signed)
 Copied from CRM [id number]. Topic: Clinical - Medication Question >> Jun 17, 2023  3:50 PM Blair Bumpers wrote: Reason for CRM: Patient called stating that she has called earlier about some medication that Dr. Veleta Gerold prescribed for her but it's not working. Advised pt that the message was routed to Dr. Veleta Gerold but he has not addressed it yet. Please contact pt in regards to this issue. CB #: [phone].

## 2023-06-17 NOTE — Telephone Encounter (Signed)
 Copied from CRM [id number]. Topic: Clinical - Medical Advice >> Jun 17, 2023 10:40 AM Amber Stephenson wrote: Reason for CRM: Medication not working for yeast infection, it is getting worse with itching, burning and discharge, please call patient [phone]

## 2023-06-18 NOTE — Telephone Encounter (Signed)
Duplicate message, spoke with patient yesterday and advised.

## 2023-06-19 ENCOUNTER — Other Ambulatory Visit: Payer: Self-pay | Admitting: Nurse Practitioner

## 2023-06-19 ENCOUNTER — Encounter: Payer: Self-pay | Admitting: *Deleted

## 2023-06-19 DIAGNOSIS — M461 Sacroiliitis, not elsewhere classified: Secondary | ICD-10-CM | POA: Diagnosis not present

## 2023-06-19 DIAGNOSIS — M5186 Other intervertebral disc disorders, lumbar region: Secondary | ICD-10-CM | POA: Diagnosis not present

## 2023-06-19 DIAGNOSIS — E1142 Type 2 diabetes mellitus with diabetic polyneuropathy: Secondary | ICD-10-CM | POA: Diagnosis not present

## 2023-06-19 DIAGNOSIS — J45901 Unspecified asthma with (acute) exacerbation: Secondary | ICD-10-CM | POA: Diagnosis not present

## 2023-06-19 DIAGNOSIS — I700 Atherosclerosis of aorta: Secondary | ICD-10-CM | POA: Diagnosis not present

## 2023-06-19 DIAGNOSIS — E278 Other specified disorders of adrenal gland: Secondary | ICD-10-CM | POA: Diagnosis not present

## 2023-06-19 DIAGNOSIS — M5184 Other intervertebral disc disorders, thoracic region: Secondary | ICD-10-CM | POA: Diagnosis not present

## 2023-06-19 DIAGNOSIS — I4891 Unspecified atrial fibrillation: Secondary | ICD-10-CM | POA: Diagnosis not present

## 2023-06-19 DIAGNOSIS — G4733 Obstructive sleep apnea (adult) (pediatric): Secondary | ICD-10-CM | POA: Diagnosis not present

## 2023-06-19 DIAGNOSIS — E785 Hyperlipidemia, unspecified: Secondary | ICD-10-CM | POA: Diagnosis not present

## 2023-06-19 DIAGNOSIS — I2510 Atherosclerotic heart disease of native coronary artery without angina pectoris: Secondary | ICD-10-CM | POA: Diagnosis not present

## 2023-06-19 DIAGNOSIS — D179 Benign lipomatous neoplasm, unspecified: Secondary | ICD-10-CM | POA: Diagnosis not present

## 2023-06-19 DIAGNOSIS — I110 Hypertensive heart disease with heart failure: Secondary | ICD-10-CM | POA: Diagnosis not present

## 2023-06-19 DIAGNOSIS — I5032 Chronic diastolic (congestive) heart failure: Secondary | ICD-10-CM | POA: Diagnosis not present

## 2023-06-19 DIAGNOSIS — G894 Chronic pain syndrome: Secondary | ICD-10-CM | POA: Diagnosis not present

## 2023-06-19 DIAGNOSIS — F319 Bipolar disorder, unspecified: Secondary | ICD-10-CM | POA: Diagnosis not present

## 2023-06-19 DIAGNOSIS — M8580 Other specified disorders of bone density and structure, unspecified site: Secondary | ICD-10-CM | POA: Diagnosis not present

## 2023-06-19 DIAGNOSIS — J9611 Chronic respiratory failure with hypoxia: Secondary | ICD-10-CM | POA: Diagnosis not present

## 2023-06-19 DIAGNOSIS — B974 Respiratory syncytial virus as the cause of diseases classified elsewhere: Secondary | ICD-10-CM | POA: Diagnosis not present

## 2023-06-19 DIAGNOSIS — I272 Pulmonary hypertension, unspecified: Secondary | ICD-10-CM | POA: Diagnosis not present

## 2023-06-19 DIAGNOSIS — E871 Hypo-osmolality and hyponatremia: Secondary | ICD-10-CM | POA: Diagnosis not present

## 2023-06-19 DIAGNOSIS — G250 Essential tremor: Secondary | ICD-10-CM | POA: Diagnosis not present

## 2023-06-19 DIAGNOSIS — M7918 Myalgia, other site: Secondary | ICD-10-CM | POA: Diagnosis not present

## 2023-06-19 DIAGNOSIS — J324 Chronic pansinusitis: Secondary | ICD-10-CM | POA: Diagnosis not present

## 2023-06-19 DIAGNOSIS — D630 Anemia in neoplastic disease: Secondary | ICD-10-CM | POA: Diagnosis not present

## 2023-06-19 NOTE — Telephone Encounter (Signed)
Copied from CRM [id number]. Topic: Clinical - Home Health Verbal Orders >> Jun 19, 2023  3:38 PM Geroge Baseman wrote: Caller/Agency: Kerin Ransom Number: [phone] (Personal cell but can leave a secure message) Service Requested: Physical Therapy Frequency: 1w1, 2w4, 1w3- to address strength, balance, and vertigo  Any new concerns about the patient? Yes, recently in hospital.

## 2023-06-22 ENCOUNTER — Telehealth: Payer: Self-pay | Admitting: Family Medicine

## 2023-06-22 NOTE — Telephone Encounter (Signed)
See what his concern is

## 2023-06-22 NOTE — Telephone Encounter (Unsigned)
Copied from CRM [id number]. Topic: Clinical - Prescription Issue >> Jun 22, 2023 11:06 AM Hector Shade B wrote: Reason for CRM: Mr. Amber Stephenson the physical therapist has called in regards to the medications the patient is currently on due to the medication interactions with each other. Therapist stated he would like to speak with someone in regards to having a nurse evalv. Therapist states the medications listed below he's very concerned about the interactions with potassium chloride.  Requesting call back to [phone] doxepin (SINEQUAN) 10 MG capsule Lexapro Hydroxyzines  spironolactone (ALDACTONE) 25 MG tablet (Expired Meclizine Trileptal Promethazine 25 Promethazine DM Aldactone 25

## 2023-06-23 ENCOUNTER — Ambulatory Visit
Admission: RE | Admit: 2023-06-23 | Discharge: 2023-06-23 | Disposition: A | Discharge status: Home / Self Care | Payer: Medicare Other | Source: Ambulatory Visit | Attending: Family Medicine | Admitting: Family Medicine

## 2023-06-23 VITALS — BP 138/82 | HR 115 | Temp 97.5°F | Resp 20

## 2023-06-23 DIAGNOSIS — S8011XA Contusion of right lower leg, initial encounter: Secondary | ICD-10-CM | POA: Diagnosis not present

## 2023-06-23 DIAGNOSIS — I110 Hypertensive heart disease with heart failure: Secondary | ICD-10-CM | POA: Diagnosis not present

## 2023-06-23 DIAGNOSIS — E1142 Type 2 diabetes mellitus with diabetic polyneuropathy: Secondary | ICD-10-CM | POA: Diagnosis not present

## 2023-06-23 DIAGNOSIS — J45901 Unspecified asthma with (acute) exacerbation: Secondary | ICD-10-CM | POA: Diagnosis not present

## 2023-06-23 DIAGNOSIS — I5032 Chronic diastolic (congestive) heart failure: Secondary | ICD-10-CM | POA: Diagnosis not present

## 2023-06-23 DIAGNOSIS — Z7901 Long term (current) use of anticoagulants: Secondary | ICD-10-CM | POA: Diagnosis not present

## 2023-06-23 DIAGNOSIS — B974 Respiratory syncytial virus as the cause of diseases classified elsewhere: Secondary | ICD-10-CM | POA: Diagnosis not present

## 2023-06-23 DIAGNOSIS — J324 Chronic pansinusitis: Secondary | ICD-10-CM | POA: Diagnosis not present

## 2023-06-23 NOTE — ED Triage Notes (Signed)
Pt reports she fell and a chair fell onto her right shin x 4 days. Pt has a large purple raised area on her right shin.

## 2023-06-23 NOTE — Discharge Instructions (Signed)
Ice, elevate, wear gentle compression stocking to help with the swelling.  This may take quite some time for the bruising to fully resolved.  Follow-up for worsening symptoms at any time.

## 2023-06-23 NOTE — Telephone Encounter (Signed)
Amber Stephenson and he is aware and verbalized understanding.

## 2023-06-23 NOTE — Telephone Encounter (Signed)
Her potassium is being monitored closely. It is in normal range. Please let P.T. provider know.

## 2023-06-23 NOTE — Telephone Encounter (Signed)
TC back to Onalee Hua PT w/ [facility] VO given for PT frequency and to add nursing eval His concern is that there are several level 2 and 3 drug interactions with her potassium medication.

## 2023-06-23 NOTE — Telephone Encounter (Signed)
TC back to Amber Stephenson [facility] VO given for PT frequency.

## 2023-06-23 NOTE — ED Provider Notes (Signed)
[facility]    CSN: [id number] Arrival date & time: 06/23/23  1420      History   Chief Complaint Chief Complaint  Patient presents with   Leg Injury    Entered by patient    HPI Amber Stephenson is a 78 y.o. female.   Patient resenting today with 4-day history of right anterior lower leg swelling, bruising, tenderness after falling in a chair hitting her in this area.  She denies severe numbness, tingling, loss of range of motion, severe pain.  So far not tried anything over-the-counter for symptoms other than some magnesium spray.  Of note, she is on chronic anticoagulation for atrial fibrillation.    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   Benign paroxysmal positional vertigo due to bilateral vestibular disorder 05/20/2019   Bipolar 1 disorder (HCC) 01/23/2015   with GAD, benzo dependence   BPPV (benign paroxysmal positional vertigo) 05/20/2019   CAD (coronary artery disease)    Nonobstructive; Managed by Dr. Purvis Sheffield   Cardiomegaly 01/12/2018   CHF (congestive heart failure) 06/08/2022   Chronic back pain 01/04/2015   Chronic constipation 04/25/2020   Chronic diastolic heart failure (HCC) 05/30/2015   Chronic pain syndrome 08/22/2019   back pain, sacroiliitis   Chronic pain syndrome 08/22/2019   Chronic post-traumatic stress disorder (PTSD) 12/06/2020   Diabetic neuropathy (HCC) 02/06/2016   Dyslipidemia 09/24/2020   Dyspnea on exertion    Essential hypertension    Family history of coronary arteriosclerosis 05/30/2015   Functional diarrhea 10/26/2020   Head trauma 09/17/2020   Hemorrhoids  03/11/2022   Herpes genitalis in women 07/16/2015   History of adenomatous polyp of colon 05/21/2019   Overview:   03/31/17: Colonoscopy: nonadvanced adenoma, microscopic colitis, f/u 5 yrs, [facility]/GAP   History of radiation therapy    Endometrial- 02/18/23-03/31/23-Dr. Antony Blackbird   Insomnia 01/23/2015   Lipoma 02/08/2015   Migraine headache with aura 02/12/2016   Myofascial pain dysfunction syndrome 08/22/2019   Myofascial pain dysfunction syndrome 08/22/2019   Non-alcoholic fatty liver disease 01/12/2018   Opioid dependence (HCC) 12/06/2020   OSA (obstructive sleep apnea) 02/24/2019   10/09/2018 - HST  - AHI 40.6    Osteopenia 12/06/2020   Rx alendronate 35 mg.   Pinguecula 12/06/2020   Postcoital bleeding 12/06/2020   Presbyopia 12/06/2020   Pulmonary hypertension    RLS (restless legs syndrome) 04/27/2015   Superficial fungus infection of skin 09/03/2021   Tremor, essential 12/11/2021   Type II diabetes mellitus (HCC) 09/24/2020    Patient Active Problem List   Diagnosis Date Noted   Hyponatremia 06/10/2023   RSV (respiratory syncytial virus infection) 06/10/2023   Abdominal pain 06/10/2023   Diarrhea 06/10/2023   Candidiasis of mouth 05/15/2023   Nausea and vomiting 02/16/2023   Type 2 diabetes mellitus with hyperglycemia, with long-term current use of insulin (HCC) 02/12/2023   Long term (current) use of oral hypoglycemic drugs 02/12/2023   Acute heart failure (HCC) 01/28/2023   Pure hypercholesterolemia 01/14/2023   Primary hypertension 01/14/2023   Metabolic acidosis 01/12/2023   Endometrial cancer (HCC) 01/12/2023   Acute on chronic respiratory failure with hypoxia (HCC) 01/12/2023  Vulvar itching 12/22/2022   PMB (postmenopausal bleeding) 11/13/2022   Superficial fungus infection of skin 11/13/2022   Burning with urination 11/13/2022   UTI (urinary tract infection) 11/09/2022   Vitamin B12 deficiency 11/09/2022   Scalp hematoma, initial encounter 11/09/2022    Frequent falls 09/01/2022   Hypotension 09/01/2022   Renal insufficiency 08/08/2022   AKI (acute kidney injury) (HCC) 06/27/2022   Hemorrhoids 03/11/2022   Rectal itching 03/11/2022   Microalbuminuria 01/21/2022   Tremor, essential 12/11/2021   Chronic bilateral low back pain with right-sided sciatica 07/19/2021   Vaginal itching 04/16/2021   Sensory ataxia 03/28/2021   Acute diastolic CHF (congestive heart failure) (HCC) 12/16/2020   Elevated troponin 12/14/2020   Chronic post-traumatic stress disorder (PTSD) 12/06/2020   Senile nuclear sclerosis 12/06/2020   Senile corneal changes 12/06/2020   Postmenopausal bleeding 12/06/2020   Acquired hammer toe 12/06/2020   Opioid dependence 12/06/2020   Hypermetropia 12/06/2020   Generalized weakness 12/06/2020   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   Generalized anxiety disorder 12/06/2020   Functional diarrhea 10/26/2020   Insulin dependent type 2 diabetes mellitus (HCC) 09/24/2020   Chronic respiratory failure with hypoxia (HCC) 04/25/2020   Chronic constipation 04/25/2020   CAD (coronary artery disease)    Hypocalcemia    S/P shoulder replacement, 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   Mixed hyperlipidemia 05/20/2019   Vertigo 04/25/2019   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   OSA (obstructive sleep apnea) 02/24/2019   Acute on chronic hypoxic respiratory failure (HCC) 01/22/2019   Acute pulmonary edema (HCC) 01/21/2019   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   At risk for adverse drug event 07/06/2018   Non-alcoholic fatty liver disease 01/12/2018   Mild intermittent asthma 01/12/2018   Senile osteoporosis 12/15/2017   Atrial fibrillation with rapid ventricular response (HCC) 05/19/2017   Migraine headache with aura 02/12/2016   Diabetic neuropathy (HCC) 02/06/2016   Bipolar 1 disorder (HCC) 10/04/2015   Major depressive disorder 10/03/2015   Herpes genitalis in women 07/16/2015   Long term current use of anticoagulant  05/30/2015   Chronic heart failure with preserved ejection fraction (HFpEF) (HCC) 05/30/2015   Essential hypertension    RLS (restless legs syndrome) 04/27/2015   Insomnia 01/23/2015   Chronic atrial fibrillation with RVR (HCC) 01/04/2015    Past Surgical History:  Procedure Laterality Date   BREAST REDUCTION SURGERY     EYE SURGERY Right    cateracts   HAMMER TOE SURGERY     LEFT HEART CATH AND CORONARY ANGIOGRAPHY N/A 02/03/2018   Procedure: LEFT HEART CATH AND CORONARY ANGIOGRAPHY;  Surgeon: [name], Peter M, MD;  Location: [facility] INVASIVE CV LAB;  Service: Cardiovascular;  Laterality: N/A;   REVERSE SHOULDER ARTHROPLASTY Right 08/19/2019   Procedure: REVERSE SHOULDER ARTHROPLASTY;  Surgeon: Beverely Low, MD;  Location: [facility];  Service: Orthopedics;  Laterality: Right;  interscalene block   SHOULDER SURGERY Right    "I BROKE MY SHOUDLER   THIGH SURGERY     "TO REMOVE A TUMOR "    OB History     Gravida  3   Para  3   Term  3   Preterm      AB      Living  3      SAB      IAB      Ectopic      Multiple      Live Births  3            Home Medications  Prior to Admission medications   Medication Sig Start Date End Date Taking? Authorizing Provider  albuterol (VENTOLIN HFA) 108 (90 Base) MCG/ACT inhaler Inhale 2 puffs into the lungs every 4 (four) hours as needed for wheezing or shortness of breath. 06/15/23   Mechele Claude, MD  allopurinol (ZYLOPRIM) 100 MG tablet Take 100 mg by mouth daily. 10/22/22   [provider]  apixaban (ELIQUIS) 5 MG TABS tablet Take 1 tablet (5 mg total) by mouth 2 (two) times daily. 09/09/22   Quintella Reichert, MD  ARIPiprazole (ABILIFY) 2 MG tablet Take 2 mg by mouth at bedtime. 10/07/21   [provider]  atorvastatin (LIPITOR) 80 MG tablet Take 1 tablet (80 mg total) by mouth daily. 02/05/23 06/11/23  Sharlene Dory, PA-C  benzonatate (TESSALON) 100 MG capsule TAKE 2 CAPSULES(200 MG) BY MOUTH THREE TIMES DAILY AS  NEEDED FOR COUGH 06/19/23   Mechele Claude, MD  clotrimazole (MYCELEX) 10 MG troche Take 10 mg by mouth in the morning and at bedtime. 05/28/23   [provider]  cyanocobalamin (VITAMIN B12) 1000 MCG tablet Take 1 tablet (1,000 mcg total) by mouth daily. 09/04/22   Ghimire, Werner Lean, MD  denosumab (PROLIA) 60 MG/ML SOSY injection Inject 60 mg into the skin every 6 (six) months. 08/14/20   Mechele Claude, MD  doxepin (SINEQUAN) 10 MG capsule Take 10 mg by mouth at bedtime. 11/27/22   [provider]  escitalopram (LEXAPRO) 10 MG tablet Take 10 mg by mouth daily.    [provider]  fluconazole (DIFLUCAN) 100 MG tablet Take two with first dose. Then starting the next day take one daily until all are taken. 06/15/23   Mechele Claude, MD  fluticasone-salmeterol (ADVAIR DISKUS) 100-50 MCG/ACT AEPB Inhale 1 puff into the lungs 2 (two) times daily. Patient taking differently: Inhale 1 puff into the lungs 2 (two) times daily as needed (for flares). 09/11/22   Parrett, Virgel Bouquet, NP  Galcanezumab-gnlm (EMGALITY) 120 MG/ML SOAJ INJECT CONTENTS ON 1 PEN(120MG ) INTO THE SKIN ONCE MONTHLY Patient taking differently: Inject 120 mg into the skin every 30 (thirty) days. 01/23/23   Marcos Eke, PA-C  Insulin Disposable Pump (OMNIPOD 5 G7 INTRO, GEN 5,) KIT 1 Device by Does not apply route every other day. Patient not taking: Reported on 06/16/2023 02/12/23   Shamleffer, Konrad Dolores, MD  Insulin Disposable Pump (OMNIPOD 5 G7 PODS, GEN 5,) MISC 1 Device by Does not apply route every other day. 02/12/23   Shamleffer, Konrad Dolores, MD  insulin glargine (LANTUS SOLOSTAR) 100 UNIT/ML Solostar Pen Inject 32 Units into the skin 2 (two) times daily. 05/22/23   Shamleffer, Konrad Dolores, MD  insulin lispro (HUMALOG KWIKPEN) 100 UNIT/ML KwikPen Max daily 45 units Patient taking differently: Inject 6-45 Units into the skin 3 (three) times daily. Max daily 45 units 05/22/23   Shamleffer, Konrad Dolores, MD  Insulin Pen Needle 29G X MISC 1 Device by Does not apply route daily in the afternoon. 05/22/23   Shamleffer, Konrad Dolores, MD  levocetirizine (XYZAL ALLERGY 24HR) 5 MG tablet Take 1 tablet (5 mg total) by mouth every evening. For itch 05/12/23   Mechele Claude, MD  lidocaine (XYLOCAINE) 2 % solution Use as directed 15 mLs in the mouth or throat every 4 (four) hours as needed for mouth pain. 05/18/23   Mechele Claude, MD  LORazepam (ATIVAN) 0.5 MG tablet Take 0.5-1 mg by mouth See admin instructions. Take 0.5 mg by mouth in the  morning and 1 mg at bedtime    [provider]  losartan (COZAAR) 25 MG tablet Take 25 mg by mouth daily. 02/14/23   [provider]  meclizine (ANTIVERT) 25 MG tablet Take 1 tablet (25 mg total) by mouth 3 (three) times daily as needed for dizziness. 06/05/23   Dettinger, Elige Radon, MD  metFORMIN (GLUCOPHAGE-XR) 500 MG 24 hr tablet Take 2 tablets (1,000 mg total) by mouth daily with breakfast. 05/22/23   Shamleffer, Konrad Dolores, MD  methocarbamol 1000 MG TABS Take 1,000 mg by mouth every 6 (six) hours as needed for muscle spasms. 02/16/23   Lovorn, Aundra Millet, MD  metoprolol tartrate (LOPRESSOR) 50 MG tablet Take 1 tablet (50 mg total) by mouth 2 (two) times daily. 01/06/23   Quintella Reichert, MD  naloxone [facility]) nasal spray 4 mg/0.1 mL Place 1 spray into the nose once as needed (AS DIRECTED). 08/08/21   [provider]  nystatin (MYCOSTATIN/NYSTOP) powder Apply 1 Application topically as needed (rash). 06/16/22   Shamleffer, Konrad Dolores, MD  nystatin ointment (MYCOSTATIN) Apply 1 Application topically 2 (two) times daily as needed (for rashes). Patient not taking: Reported on 06/16/2023 04/06/23   Adline Potter, NP  omeprazole (PRILOSEC) 40 MG capsule Take 40 mg by mouth daily.    [provider]  ondansetron (ZOFRAN) 4 MG tablet Take 1 tablet (4 mg total) by mouth every 8 (eight) hours as needed for nausea or vomiting.  For cancer related nausea 02/16/23   Lovorn, Aundra Millet, MD  OXcarbazepine (TRILEPTAL) 150 MG tablet Take 1 tablet (150 mg total) by mouth 2 (two) times daily. 06/13/22   Marguerita Merles Latif, DO  Oxycodone HCl 20 MG TABS Take 1 tablet by mouth 2 (two) times daily as needed. 04/16/23   [provider]  pantoprazole (PROTONIX) 40 MG tablet Take 40 mg by mouth daily as needed (for reflux).    [provider]  potassium chloride (KLOR-CON) 10 MEQ tablet Take 1 tablet (10 mEq total) by mouth daily. 02/05/23 06/11/23  Sharlene Dory, PA-C  predniSONE (DELTASONE) 20 MG tablet Take 1 tablet (20 mg total) by mouth 2 (two) times daily with a meal. 06/15/23   Mechele Claude, MD  pregabalin (LYRICA) 200 MG capsule Take 200 mg by mouth 2 (two) times daily.    [provider]  promethazine (PHENERGAN) 25 MG tablet Take 0.5-1 tablets (12.5-25 mg total) by mouth every 6 (six) hours as needed for refractory nausea / vomiting (cancer related nausea/vomiting). 02/16/23   Lovorn, Aundra Millet, MD  promethazine-dextromethorphan (PROMETHAZINE-DM) 6.25-15 MG/5ML syrup Take 5 mLs by mouth 4 (four) times daily as needed for cough. 06/15/23   Mechele Claude, MD  rOPINIRole (REQUIP) 1 MG tablet Take 1 tablet (1 mg total) by mouth at bedtime. For leg cramps 05/12/23   Mechele Claude, MD  senna-docusate (SENOKOT-S) 8.6-50 MG tablet Take 1 tablet by mouth at bedtime. Patient taking differently: Take 1 tablet by mouth at bedtime as needed (for constipation). 06/13/22   Marguerita Merles Latif, DO  spironolactone (ALDACTONE) 25 MG tablet Take 0.5 tablets (12.5 mg total) by mouth daily. 01/31/23 06/11/23  Narda Bonds, MD  tirzepatide [facility]) 7.5 MG/0.5ML Pen Inject 7.5 mg into the skin once a week. 05/22/23   Shamleffer, Konrad Dolores, MD  TYLENOL 500 MG tablet Take 500-1,000 mg by mouth every 6 (six) hours as needed for mild pain (pain score 1-3) or headache.    [provider]  Vitamin D, Cholecalciferol, 25 MCG  (  1000 UT) TABS Take 1,000 Units by mouth daily in the afternoon. 07/26/20   [provider]    Family History Family History  Problem Relation Age of Onset   Diabetes Mother    Heart disease Mother    Alzheimer's disease Father    Heart disease Sister        CABG   Diabetes Sister    Alcohol abuse Sister    Stroke Brother    Heart disease Brother    Mental illness Brother    Diabetes Brother    Breast cancer Neg Hx    Ovarian cancer Neg Hx    Colon cancer Neg Hx    Endometrial cancer Neg Hx     Social History Social History   Tobacco Use   Smoking status: Never   Smokeless tobacco: Never  Vaping Use   Vaping status: Never Used  Substance Use Topics   Alcohol use: Not Currently    Alcohol/week: 0.0 standard drinks of alcohol   Drug use: No     Allergies   Iodine, Ivp dye [iodinated contrast media], Latex, Tape, and Tizanidine   Review of Systems Review of Systems Per HPI  Physical Exam Triage Vital Signs ED Triage Vitals  Encounter Vitals Group     BP 06/23/23 1455 138/82     Systolic BP Percentile --      Diastolic BP Percentile --      Pulse Rate 06/23/23 1455 (!) 115     Resp 06/23/23 1455 20     Temp 06/23/23 1455 (!) 97.5 F (36.4 C)     Temp Source 06/23/23 1455 Oral     SpO2 06/23/23 1455 95 %     Weight --      Height --      Head Circumference --      Peak Flow --      Pain Score 06/23/23 1458 7     Pain Loc --      Pain Education --      Exclude from Growth Chart --    No data found.  Updated Vital Signs BP 138/82 (BP Location: Right Arm)   Pulse (!) 115   Temp (!) 97.5 F (36.4 C) (Oral)   Resp 20   SpO2 95%   Visual Acuity Right Eye Distance:   Left Eye Distance:   Bilateral Distance:    Right Eye Near:   Left Eye Near:    Bilateral Near:     Physical Exam Vitals and nursing note reviewed.  Constitutional:      Appearance: Normal appearance. She is not ill-appearing.  HENT:     Head: Atraumatic.  Eyes:      Extraocular Movements: Extraocular movements intact.     Conjunctiva/sclera: Conjunctivae normal.  Cardiovascular:     Rate and Rhythm: Normal rate and regular rhythm.     Heart sounds: Normal heart sounds.  Pulmonary:     Effort: Pulmonary effort is normal.     Breath sounds: Normal breath sounds.  Musculoskeletal:        General: Swelling, tenderness and signs of injury present. No deformity. Normal range of motion.     Cervical back: Normal range of motion and neck supple.  Skin:    General: Skin is warm and dry.     Findings: Bruising present.     Comments: 1.5 cm significant hematoma to the right anterior lower leg with diffuse bruising and edema extending about 4 inches below  Neurological:  Mental Status: She is alert and oriented to person, place, and time.     Motor: No weakness.     Gait: Gait normal.     Comments: Bilateral lower extremities neurovascularly intact  Psychiatric:        Mood and Affect: Mood normal.        Thought Content: Thought content normal.        Judgment: Judgment normal.    UC Treatments / Results  Labs (all labs ordered are listed, but only abnormal results are displayed) Labs Reviewed - No data to display  EKG  Radiology No results found.  Procedures Procedures (including critical care time)  Medications Ordered in UC Medications - No data to display  Initial Impression / Assessment and Plan / UC Course  I have reviewed the triage vital signs and the nursing notes.  Pertinent labs & imaging results that were available during my care of the patient were reviewed by me and considered in my medical decision making (see chart for details).     X-ray imaging deferred with shared decision making today.  She is neurovascularly intact, no evidence of compartment syndrome or other emergent finding today.  Suspect significance of hematoma secondary to anticoagulation.  Discussed RICE protocol, over-the-counter pain relievers and return  precautions at length.  Final Clinical Impressions(s) / UC Diagnoses   Final diagnoses:  Hematoma of right lower extremity, initial encounter  Chronic anticoagulation     Discharge Instructions      Ice, elevate, wear gentle compression stocking to help with the swelling.  This may take quite some time for the bruising to fully resolved.  Follow-up for worsening symptoms at any time.    ED Prescriptions   None    PDMP not reviewed this encounter.   Particia Nearing, [place] 06/23/23 [phone]

## 2023-06-24 ENCOUNTER — Other Ambulatory Visit: Payer: Self-pay

## 2023-06-24 NOTE — Patient Outreach (Signed)
Care Management  Transitions of Care Program Transitions of Care Post-discharge week 2   06/24/2023 Name: Amber Stephenson MRN: [id number] DOB: Jan 08, 1946  Subjective: Amber Stephenson is a 78 y.o. year old female who is a primary care patient of Stacks, Broadus John, MD. The Care Management team Engaged with patient Engaged with patient by telephone to assess and address transitions of care needs.   Consent to Services:  Patient was given information about care management services, agreed to services, and gave verbal consent to participate.   Assessment: Patient reports that she saw PT and was told to go to urgent care.  Diagnosed with hematoma. Patient reports that she fell forward and a chair fell on her on the right leg. Able to walk. Patient reports no changes in medications. Reports that the medication given for her yeast infection is working. Reports that she is drinking well but has a decrease in appetite. Reports no fever but continues to have a cough.      SDOH Interventions    Flowsheet Row Telephone from 06/16/2023 in [place] [facility] DEPARTMENT Telephone from 02/03/2023 in [facility] Community Care Coordination Clinical Support from 01/23/2023 in [facility] Telephone from 01/22/2023 in [facility] Care Coordination Telephone from 11/13/2022 in [facility] Telephone from 09/05/2022 in [facility]  SDOH Interventions        Food Insecurity Interventions Intervention Not Indicated Intervention Not Indicated Intervention Not Indicated Intervention Not Indicated Intervention Not Indicated Intervention Not Indicated  Housing Interventions Intervention Not Indicated -- Intervention Not Indicated -- Intervention Not Indicated Intervention Not Indicated  Transportation Interventions Intervention Not Indicated Intervention Not Indicated  Intervention Not Indicated Intervention Not Indicated Intervention Not Indicated, Patient Resources Dietitian) --  Utilities Interventions Intervention Not Indicated Intervention Not Indicated Intervention Not Indicated -- -- --  Alcohol Usage Interventions -- -- Intervention Not Indicated (Score <7) -- -- --  Financial Strain Interventions -- -- Intervention Not Indicated -- -- --  Stress Interventions -- -- Intervention Not Indicated -- -- --  Social Connections Interventions -- -- Intervention Not Indicated -- -- --  Health Literacy Interventions -- -- Intervention Not Indicated -- -- --        Goals Addressed             This Visit's Progress    Transition of Care/ pt will have no readmissions within 30 days       Current Barriers:  Home Health services [facility] has not contacted pt. 06/24/2023  Home health has started.  PT sent patient to the urgent care for eval of leg after fall. Dx: hematoma Knowledge Deficits related to plan of care for management of RSV 06/24/2023   Continues to have cough, no fever. Drinking well Patient reports saw PCP 06/15/23 and states " not feeling much better, still have cough/ wheezing", pt prescribed prednisone yesterday and taking cough syrup, pt is afebrile, pt using oxygen at 2 liters hs and prn.  06/23/2023  Patient reports that she is using 3 liters of oxygen at night and as needed during the day.  Patient states home health has not contacted her yet  06/24/2023  Active with home health  RNCM Clinical Goal(s):  Patient will work with the Care Management team over the next 30 days to address Transition of Care Barriers: Home Health services verbalize understanding of plan for management of RSV as evidenced by pt  report, review of EMR and  through collaboration with RN Care manager, provider, and care team.   Interventions: Evaluation of current treatment plan related to  self management and patient's adherence to plan as established  by provider RSV  (Status:  Goal on track:  Yes.)  Short Term Goal Evaluation of current treatment plan related to  RSV ,  home health has not seen patient,   self-management and patient's adherence to plan as established by provider. Discussed plans with patient for ongoing care management follow up and provided patient with direct contact information for care management team Reviewed upcoming scheduled appointments Reviewed with patient importance of healthy diet. Reviewed with patient to continue to take her medications as prescribed and call MD for additional concerns.  Patient Goals/Self-Care Activities: Participate in Transition of Care Program/Attend [facility] scheduled calls Notify RN Care Manager of TOC call rescheduling needs Take all medications as prescribed Attend all scheduled provider appointments Call pharmacy for medication refills 3-7 days in advance of running out of medications Perform all self care activities independently  Call provider office for new concerns or questions  Please contact your doctor for unresolved/ worsening symptoms- cough, wheezing, fever Call 911 for shortness of breath, chest pain Use oxygen as prescribed  Follow Up Plan:  Telephone follow up appointment with care management team member scheduled for:  07/01/23 @ 10: 15 am          Plan: Telephone follow up appointment with care management team member scheduled for: 07/01/2023 with Teena Irani, RN, BSN, CEN Population Health- Transition of Care Team.  [facility] [phone]

## 2023-06-24 NOTE — Patient Instructions (Signed)
Visit Information  Thank you for taking time to visit with me today. Please don't hesitate to contact me if I can be of assistance to you before our next scheduled telephone appointment.  Following are the goals we discussed today:   Goals      Exercise 3x per week (30 min per time)     Management of Fall prevention, oncology services [facility] nurse Care Coordination     Interventions Today    Flowsheet Row Most Recent Value  Chronic Disease   Chronic disease during today's visit Other  General Interventions   General Interventions Discussed/Reviewed General Interventions Reviewed, Doctor Visits, Sick Day Rules  Labs Kidney Function  [electrolyte imbalance potassium, magnesium, sodium]  Doctor Visits Discussed/Reviewed Doctor Visits Reviewed, PCP  PCP/Specialist Visits Compliance with follow-up visit  Education Interventions   Education Provided Provided Education  Provided Verbal Education On Other, Sick Day Rules  Pharmacy Interventions   Pharmacy Dicussed/Reviewed Pharmacy Topics Reviewed  Safety Interventions   Safety Discussed/Reviewed Safety Reviewed, Fall Risk           Prevent falls     Work on balance     Transition of Care/ pt will have no readmissions within 30 days     Current Barriers:  Home Health services [facility] has not contacted pt. 06/24/2023  Home health has started.  PT sent patient to the urgent care for eval of leg after fall. Dx: hematoma Knowledge Deficits related to plan of care for management of RSV 06/24/2023   Continues to have cough, no fever. Drinking well Patient reports saw PCP 06/15/23 and states " not feeling much better, still have cough/ wheezing", pt prescribed prednisone yesterday and taking cough syrup, pt is afebrile, pt using oxygen at 2 liters hs and prn.  06/23/2023  Patient reports that she is using 3 liters of oxygen at night and as needed during the day.  Patient states home health has not contacted her yet  06/24/2023  Active  with home health  RNCM Clinical Goal(s):  Patient will work with the Care Management team over the next 30 days to address Transition of Care Barriers: Home Health services verbalize understanding of plan for management of RSV as evidenced by pt report, review of EMR and  through collaboration with RN Care manager, provider, and care team.   Interventions: Evaluation of current treatment plan related to  self management and patient's adherence to plan as established by provider RSV  (Status:  Goal on track:  Yes.)  Short Term Goal Evaluation of current treatment plan related to  RSV ,  home health has not seen patient,   self-management and patient's adherence to plan as established by provider. Discussed plans with patient for ongoing care management follow up and provided patient with direct contact information for care management team Reviewed upcoming scheduled appointments Reviewed with patient importance of healthy diet. Reviewed with patient to continue to take her medications as prescribed and call MD for additional concerns.  Patient Goals/Self-Care Activities: Participate in Transition of Care Program/Attend [facility] scheduled calls Notify RN Care Manager of TOC call rescheduling needs Take all medications as prescribed Attend all scheduled provider appointments Call pharmacy for medication refills 3-7 days in advance of running out of medications Perform all self care activities independently  Call provider office for new concerns or questions  Please contact your doctor for unresolved/ worsening symptoms- cough, wheezing, fever Call 911 for shortness of breath, chest pain Use oxygen as prescribed  Follow Up Plan:  Telephone follow up appointment with care management team member scheduled for:  07/01/23 @ 10: 15 am           Our next appointment is by telephone on 07/01/2023 at 1015  Please call the care guide team at [phone] if you need to cancel or reschedule your  appointment.   If you are experiencing a Mental Health or Behavioral Health Crisis or need someone to talk to, please call 911   Patient verbalizes understanding of instructions and care plan provided today and agrees to view in MyChart. Active MyChart status and patient understanding of how to access instructions and care plan via MyChart confirmed with patient.     Lonia Chimera, RN, BSN, CEN [employer]- Transition of Care Team.  [facility] [phone]

## 2023-06-25 DIAGNOSIS — I5032 Chronic diastolic (congestive) heart failure: Secondary | ICD-10-CM | POA: Diagnosis not present

## 2023-06-25 DIAGNOSIS — J324 Chronic pansinusitis: Secondary | ICD-10-CM | POA: Diagnosis not present

## 2023-06-25 DIAGNOSIS — B974 Respiratory syncytial virus as the cause of diseases classified elsewhere: Secondary | ICD-10-CM | POA: Diagnosis not present

## 2023-06-25 DIAGNOSIS — J45901 Unspecified asthma with (acute) exacerbation: Secondary | ICD-10-CM | POA: Diagnosis not present

## 2023-06-25 DIAGNOSIS — E1142 Type 2 diabetes mellitus with diabetic polyneuropathy: Secondary | ICD-10-CM | POA: Diagnosis not present

## 2023-06-25 DIAGNOSIS — I110 Hypertensive heart disease with heart failure: Secondary | ICD-10-CM | POA: Diagnosis not present

## 2023-06-26 ENCOUNTER — Other Ambulatory Visit: Payer: Self-pay

## 2023-06-26 ENCOUNTER — Emergency Department (HOSPITAL_BASED_OUTPATIENT_CLINIC_OR_DEPARTMENT_OTHER): Payer: Medicare Other

## 2023-06-26 ENCOUNTER — Ambulatory Visit: Payer: Self-pay | Admitting: Family Medicine

## 2023-06-26 ENCOUNTER — Emergency Department (HOSPITAL_BASED_OUTPATIENT_CLINIC_OR_DEPARTMENT_OTHER)
Admission: EM | Admit: 2023-06-26 | Discharge: 2023-06-26 | Disposition: A | Discharge status: Home / Self Care | Payer: Medicare Other | Attending: Emergency Medicine | Admitting: Emergency Medicine

## 2023-06-26 ENCOUNTER — Emergency Department (HOSPITAL_BASED_OUTPATIENT_CLINIC_OR_DEPARTMENT_OTHER): Payer: Medicare Other | Admitting: Radiology

## 2023-06-26 ENCOUNTER — Encounter (HOSPITAL_BASED_OUTPATIENT_CLINIC_OR_DEPARTMENT_OTHER): Payer: Self-pay | Admitting: Emergency Medicine

## 2023-06-26 DIAGNOSIS — J121 Respiratory syncytial virus pneumonia: Secondary | ICD-10-CM

## 2023-06-26 DIAGNOSIS — I10 Essential (primary) hypertension: Secondary | ICD-10-CM | POA: Diagnosis not present

## 2023-06-26 DIAGNOSIS — R0602 Shortness of breath: Secondary | ICD-10-CM

## 2023-06-26 DIAGNOSIS — Z794 Long term (current) use of insulin: Secondary | ICD-10-CM | POA: Insufficient documentation

## 2023-06-26 DIAGNOSIS — R911 Solitary pulmonary nodule: Secondary | ICD-10-CM | POA: Diagnosis not present

## 2023-06-26 DIAGNOSIS — J189 Pneumonia, unspecified organism: Secondary | ICD-10-CM

## 2023-06-26 DIAGNOSIS — Z79899 Other long term (current) drug therapy: Secondary | ICD-10-CM | POA: Insufficient documentation

## 2023-06-26 DIAGNOSIS — I5032 Chronic diastolic (congestive) heart failure: Secondary | ICD-10-CM | POA: Diagnosis not present

## 2023-06-26 DIAGNOSIS — R918 Other nonspecific abnormal finding of lung field: Secondary | ICD-10-CM | POA: Diagnosis not present

## 2023-06-26 DIAGNOSIS — I3481 Nonrheumatic mitral (valve) annulus calcification: Secondary | ICD-10-CM | POA: Diagnosis not present

## 2023-06-26 DIAGNOSIS — R051 Acute cough: Secondary | ICD-10-CM

## 2023-06-26 DIAGNOSIS — Z96611 Presence of right artificial shoulder joint: Secondary | ICD-10-CM | POA: Diagnosis not present

## 2023-06-26 DIAGNOSIS — R079 Chest pain, unspecified: Secondary | ICD-10-CM

## 2023-06-26 DIAGNOSIS — Z7984 Long term (current) use of oral hypoglycemic drugs: Secondary | ICD-10-CM | POA: Diagnosis not present

## 2023-06-26 DIAGNOSIS — J181 Lobar pneumonia, unspecified organism: Secondary | ICD-10-CM | POA: Diagnosis not present

## 2023-06-26 DIAGNOSIS — J45901 Unspecified asthma with (acute) exacerbation: Secondary | ICD-10-CM | POA: Diagnosis not present

## 2023-06-26 DIAGNOSIS — Z7901 Long term (current) use of anticoagulants: Secondary | ICD-10-CM | POA: Diagnosis not present

## 2023-06-26 DIAGNOSIS — E1142 Type 2 diabetes mellitus with diabetic polyneuropathy: Secondary | ICD-10-CM | POA: Diagnosis not present

## 2023-06-26 DIAGNOSIS — I509 Heart failure, unspecified: Secondary | ICD-10-CM | POA: Diagnosis not present

## 2023-06-26 DIAGNOSIS — E119 Type 2 diabetes mellitus without complications: Secondary | ICD-10-CM | POA: Diagnosis not present

## 2023-06-26 DIAGNOSIS — R0789 Other chest pain: Secondary | ICD-10-CM | POA: Diagnosis not present

## 2023-06-26 DIAGNOSIS — B974 Respiratory syncytial virus as the cause of diseases classified elsewhere: Secondary | ICD-10-CM | POA: Diagnosis not present

## 2023-06-26 DIAGNOSIS — J324 Chronic pansinusitis: Secondary | ICD-10-CM | POA: Diagnosis not present

## 2023-06-26 DIAGNOSIS — I110 Hypertensive heart disease with heart failure: Secondary | ICD-10-CM | POA: Diagnosis not present

## 2023-06-26 LAB — BASIC METABOLIC PANEL
Anion gap: 7 (ref 5–15)
BUN: 9 mg/dL (ref 8–23)
CO2: 25 mmol/L (ref 22–32)
Calcium: 8.6 mg/dL — ABNORMAL LOW (ref 8.9–10.3)
Chloride: 104 mmol/L (ref 98–111)
Creatinine, Ser: 0.8 mg/dL (ref 0.44–1.00)
GFR, Estimated: >60 mL/min (ref >60)
Glucose, Bld: 205 mg/dL — ABNORMAL HIGH (ref 70–99)
Potassium: 4.6 mmol/L (ref 3.5–5.1)
Sodium: 136 mmol/L (ref 135–145)

## 2023-06-26 LAB — HEPATIC FUNCTION PANEL
ALT: 14 U/L (ref 0–44)
AST: 19 U/L (ref 15–41)
Albumin: 3.6 g/dL (ref 3.5–5.0)
Alkaline Phosphatase: 82 U/L (ref 38–126)
Bilirubin, Direct: 0.1 mg/dL (ref 0.0–0.2)
Indirect Bilirubin: 0.4 mg/dL (ref 0.3–0.9)
Total Bilirubin: 0.5 mg/dL (ref 0.0–1.2)
Total Protein: 6.1 g/dL — ABNORMAL LOW (ref 6.5–8.1)

## 2023-06-26 LAB — LIPASE, BLOOD: Lipase: 18 U/L (ref 11–51)

## 2023-06-26 LAB — CBC
HCT: 32.6 % — ABNORMAL LOW (ref 36.0–46.0)
Hemoglobin: 10.3 g/dL — ABNORMAL LOW (ref 12.0–15.0)
MCH: 31.4 pg (ref 26.0–34.0)
MCHC: 31.6 g/dL (ref 30.0–36.0)
MCV: 99.4 fL (ref 80.0–100.0)
Platelets: 171 10*3/uL (ref 150–400)
RBC: 3.28 MIL/uL — ABNORMAL LOW (ref 3.87–5.11)
RDW: 17.5 % — ABNORMAL HIGH (ref 11.5–15.5)
WBC: 6.3 10*3/uL (ref 4.0–10.5)
nRBC: 0 % (ref 0.0–0.2)

## 2023-06-26 LAB — TROPONIN I (HIGH SENSITIVITY)
Troponin I (High Sensitivity): 12 ng/L (ref <18)
Troponin I (High Sensitivity): 11 ng/L (ref <18)

## 2023-06-26 MED ORDER — AZITHROMYCIN 250 MG PO TABS
250.0000 mg | ORAL_TABLET | Freq: Every day | ORAL | 0 refills | Status: DC
Start: 2023-06-26 — End: 2023-07-13

## 2023-06-26 MED ORDER — MORPHINE SULFATE (PF) 4 MG/ML IV SOLN
4.0000 mg | Freq: Once | INTRAVENOUS | Status: AC
  Administered 2023-06-26: 4 mg via INTRAVENOUS
  Filled 2023-06-26: qty 1

## 2023-06-26 MED ORDER — FENTANYL CITRATE PF 50 MCG/ML IJ SOSY
50.0000 ug | PREFILLED_SYRINGE | Freq: Once | INTRAMUSCULAR | Status: AC
  Administered 2023-06-26: 50 ug via INTRAVENOUS
  Filled 2023-06-26: qty 1

## 2023-06-26 MED ORDER — OXYCODONE HCL 5 MG PO TABS
20.0000 mg | ORAL_TABLET | Freq: Once | ORAL | Status: AC
  Administered 2023-06-26: 20 mg via ORAL
  Filled 2023-06-26: qty 4

## 2023-06-26 MED ORDER — METOPROLOL TARTRATE 25 MG PO TABS
50.0000 mg | ORAL_TABLET | Freq: Two times a day (BID) | ORAL | Status: DC
  Administered 2023-06-26: 50 mg via ORAL
  Filled 2023-06-26: qty 2

## 2023-06-26 MED ORDER — METHYLPREDNISOLONE SODIUM SUCC 40 MG IJ SOLR
40.0000 mg | Freq: Once | INTRAMUSCULAR | Status: AC
  Administered 2023-06-26: 40 mg via INTRAVENOUS
  Filled 2023-06-26: qty 1

## 2023-06-26 MED ORDER — CEFTRIAXONE SODIUM 1 G IJ SOLR
1.0000 g | Freq: Once | INTRAMUSCULAR | Status: AC
  Administered 2023-06-26: 1 g via INTRAVENOUS
  Filled 2023-06-26: qty 10

## 2023-06-26 MED ORDER — DIPHENHYDRAMINE HCL 50 MG/ML IJ SOLN
50.0000 mg | Freq: Once | INTRAMUSCULAR | Status: AC
  Administered 2023-06-26: 50 mg via INTRAVENOUS
  Filled 2023-06-26: qty 1

## 2023-06-26 MED ORDER — AMOXICILLIN-POT CLAVULANATE 875-125 MG PO TABS: 1.0000 | ORAL_TABLET | Freq: Two times a day (BID) | ORAL | 0 refills | Status: DC

## 2023-06-26 MED ORDER — IOHEXOL 350 MG/ML SOLN
100.0000 mL | Freq: Once | INTRAVENOUS | Status: AC | PRN
  Administered 2023-06-26: 100 mL via INTRAVENOUS

## 2023-06-26 MED ORDER — DIPHENHYDRAMINE HCL 25 MG PO CAPS: 50.0000 mg | ORAL_CAPSULE | Freq: Once | ORAL | Status: AC

## 2023-06-26 MED ORDER — PROMETHAZINE-DM 6.25-15 MG/5ML PO SYRP: 5.0000 mL | ORAL_SOLUTION | Freq: Four times a day (QID) | ORAL | 0 refills | Status: DC | PRN

## 2023-06-26 NOTE — ED Notes (Signed)
Pt transported to CT and back with RN and on transport monitor without incident.

## 2023-06-26 NOTE — Discharge Instructions (Addendum)
You were seen today for chest pain.  Your CT scan was concerning for possible pneumonia.  You are being started on antibiotics.  You should follow-up closely with your doctor.  If you develop difficulty breathing, worsening pain, fainting or any other new concerning symptoms you should return to the ED.

## 2023-06-26 NOTE — ED Notes (Signed)
Pt states she has had a "terrible cough" due to RSV for the last week, MD notified.

## 2023-06-26 NOTE — Telephone Encounter (Signed)
Copied from CRM [id number]. Topic: Clinical - Red Word Triage >> Jun 26, 2023  3:53 PM Elle L wrote: Red Word that prompted transfer to Nurse Triage: The patient woke up from a nap with severe chest pain.  Chief Complaint: chest pain Symptoms: Chest/lower than diaphragm to center back "feels like I need to burp" like something is sitting on my chest, SOB Frequency: today- woke pt up out of sleep from nap Pertinent Negatives: Patient denies n/a Disposition: [x] ED /[] Urgent Care (no appt availability in office) / [] Appointment(In office/virtual)/ []  [facility]/ [] Home Care/ [] Refused Recommended Disposition /[] [facility]/ []  Follow-up with PCP Additional Notes: referred pt to  ED: husband stated he would take pt now.  Reason for Disposition  SEVERE chest pain  Answer Assessment - Initial Assessment Questions 1. LOCATION: "Where does it hurt?"       Chest/lower than diaphragm to center back "feels like I need to burp" like something is sitting on my chest 2. RADIATION: "Does the pain go anywhere else?" (e.g., into neck, jaw, arms, back)     Chest/lower than diaphragm to center back "feels like I need to burp" like something is sitting on my chest 3. ONSET: "When did the chest pain begin?" (Minutes, hours or days)      Started a little while ago - woke pt up out of sleep 4. PATTERN: "Does the pain come and go, or has it been constant since it started?"  "Does it get worse with exertion?"      constant 5. DURATION: "How long does it last" (e.g., seconds, minutes, hours)     N/a 6. SEVERITY: "How bad is the pain?"  (e.g., Scale 1-10; mild, moderate, or severe)    - MILD (1-3): doesn't interfere with normal activities     - MODERATE (4-7): interferes with normal activities or awakens from sleep    - SEVERE (8-10): excruciating pain, unable to do any normal activities       9/10 7. CARDIAC RISK FACTORS: "Do you have any history of heart problems or risk factors for heart  disease?" (e.g., angina, prior heart attack; diabetes, high blood pressure, high cholesterol, smoker, or strong family history of heart disease)     unknonwn 8. PULMONARY RISK FACTORS: "Do you have any history of lung disease?"  (e.g., blood clots in lung, asthma, emphysema, birth control pills)     N/a 9. CAUSE: "What do you think is causing the chest pain?"     N/a 10. OTHER SYMPTOMS: "Do you have any other symptoms?" (e.g., dizziness, nausea, vomiting, sweating, fever, difficulty breathing, cough)       Indigestion like feeling 11. PREGNANCY: "Is there any chance you are pregnant?" "When was your last menstrual period?"       no  Protocols used: Chest Pain-A-AH

## 2023-06-26 NOTE — ED Provider Notes (Signed)
[place] [facility] DEPARTMENT AT [facility] Provider Note   CSN: [id number] Arrival date & time: 06/26/23  1633     History  Chief Complaint  Patient presents with   Chest Pain    Amber Stephenson is a 78 y.o. female.   Chest Pain 78 year old female history of CHF, type 2 diabetes, chronic pain, atrial fibrillation on anticoagulation, hypertension presenting for chest pain.  She woke up this afternoon with severe chest pain that radiates to her back.  It is sharp.  Radiates into her abdomen as well.  No nausea or vomiting or diaphoresis.  Some shortness of breath.  Nonpleuritic.  She is anticoagulated, not aware of any history of DVT or PE history.  Normal bowel movements.  No fevers or chills.  No headache or neurologic changes.  She reports anaphylaxis to the MRI contrast and think she is allergic to CT contrast as well.  She recently had a CT of her chest for PE with premedication and had no symptoms with this.     Home Medications Prior to Admission medications   Medication Sig Start Date End Date Taking? Authorizing Provider  amoxicillin-clavulanate (AUGMENTIN) 875-125 MG tablet Take 1 tablet by mouth every 12 (twelve) hours. 06/26/23  Yes Laurence Spates, MD  azithromycin (ZITHROMAX Z-PAK) 250 MG tablet Take 1 tablet (250 mg total) by mouth daily. Take 2 tablets the first day followed by 1 tablet daily. 06/26/23  Yes Laurence Spates, MD  albuterol (VENTOLIN HFA) 108 (90 Base) MCG/ACT inhaler Inhale 2 puffs into the lungs every 4 (four) hours as needed for wheezing or shortness of breath. 06/15/23   Mechele Claude, MD  allopurinol (ZYLOPRIM) 100 MG tablet Take 100 mg by mouth daily. 10/22/22   [provider]  apixaban (ELIQUIS) 5 MG TABS tablet Take 1 tablet (5 mg total) by mouth 2 (two) times daily. 09/09/22   Quintella Reichert, MD  ARIPiprazole (ABILIFY) 2 MG tablet Take 2 mg by mouth at bedtime. 10/07/21   [provider]  atorvastatin (LIPITOR) 80 MG  tablet Take 1 tablet (80 mg total) by mouth daily. 02/05/23 06/11/23  Sharlene Dory, PA-C  benzonatate (TESSALON) 100 MG capsule TAKE 2 CAPSULES(200 MG) BY MOUTH THREE TIMES DAILY AS NEEDED FOR COUGH 06/19/23   Mechele Claude, MD  clotrimazole (MYCELEX) 10 MG troche Take 10 mg by mouth in the morning and at bedtime. 05/28/23   [provider]  cyanocobalamin (VITAMIN B12) 1000 MCG tablet Take 1 tablet (1,000 mcg total) by mouth daily. 09/04/22   Ghimire, Werner Lean, MD  denosumab (PROLIA) 60 MG/ML SOSY injection Inject 60 mg into the skin every 6 (six) months. 08/14/20   Mechele Claude, MD  doxepin (SINEQUAN) 10 MG capsule Take 10 mg by mouth at bedtime. 11/27/22   [provider]  escitalopram (LEXAPRO) 10 MG tablet Take 10 mg by mouth daily.    [provider]  fluconazole (DIFLUCAN) 100 MG tablet Take two with first dose. Then starting the next day take one daily until all are taken. 06/15/23   Mechele Claude, MD  fluticasone-salmeterol (ADVAIR DISKUS) 100-50 MCG/ACT AEPB Inhale 1 puff into the lungs 2 (two) times daily. Patient taking differently: Inhale 1 puff into the lungs 2 (two) times daily as needed (for flares). 09/11/22   Parrett, Virgel Bouquet, NP  Galcanezumab-gnlm (EMGALITY) 120 MG/ML SOAJ INJECT CONTENTS ON 1 PEN(120MG ) INTO THE SKIN ONCE MONTHLY Patient taking differently: Inject 120 mg into the skin every 30 (  thirty) days. 01/23/23   Marcos Eke, PA-C  Insulin Disposable Pump (OMNIPOD 5 G7 INTRO, GEN 5,) KIT 1 Device by Does not apply route every other day. Patient not taking: Reported on 06/16/2023 02/12/23   Shamleffer, Konrad Dolores, MD  Insulin Disposable Pump (OMNIPOD 5 G7 PODS, GEN 5,) MISC 1 Device by Does not apply route every other day. 02/12/23   Shamleffer, Konrad Dolores, MD  insulin glargine (LANTUS SOLOSTAR) 100 UNIT/ML Solostar Pen Inject 32 Units into the skin 2 (two) times daily. 05/22/23   Shamleffer, Konrad Dolores, MD  insulin lispro (HUMALOG  KWIKPEN) 100 UNIT/ML KwikPen Max daily 45 units Patient taking differently: Inject 6-45 Units into the skin 3 (three) times daily. Max daily 45 units 05/22/23   Shamleffer, Konrad Dolores, MD  Insulin Pen Needle 29G X MISC 1 Device by Does not apply route daily in the afternoon. 05/22/23   Shamleffer, Konrad Dolores, MD  levocetirizine (XYZAL ALLERGY 24HR) 5 MG tablet Take 1 tablet (5 mg total) by mouth every evening. For itch 05/12/23   Mechele Claude, MD  lidocaine (XYLOCAINE) 2 % solution Use as directed 15 mLs in the mouth or throat every 4 (four) hours as needed for mouth pain. 05/18/23   Mechele Claude, MD  LORazepam (ATIVAN) 0.5 MG tablet Take 0.5-1 mg by mouth See admin instructions. Take 0.5 mg by mouth in the morning and 1 mg at bedtime    [provider]  losartan (COZAAR) 25 MG tablet Take 25 mg by mouth daily. 02/14/23   [provider]  meclizine (ANTIVERT) 25 MG tablet Take 1 tablet (25 mg total) by mouth 3 (three) times daily as needed for dizziness. 06/05/23   Dettinger, Elige Radon, MD  metFORMIN (GLUCOPHAGE-XR) 500 MG 24 hr tablet Take 2 tablets (1,000 mg total) by mouth daily with breakfast. 05/22/23   Shamleffer, Konrad Dolores, MD  methocarbamol 1000 MG TABS Take 1,000 mg by mouth every 6 (six) hours as needed for muscle spasms. 02/16/23   Lovorn, Aundra Millet, MD  metoprolol tartrate (LOPRESSOR) 50 MG tablet Take 1 tablet (50 mg total) by mouth 2 (two) times daily. 01/06/23   Quintella Reichert, MD  naloxone [facility]) nasal spray 4 mg/0.1 mL Place 1 spray into the nose once as needed (AS DIRECTED). 08/08/21   [provider]  nystatin (MYCOSTATIN/NYSTOP) powder Apply 1 Application topically as needed (rash). 06/16/22   Shamleffer, Konrad Dolores, MD  nystatin ointment (MYCOSTATIN) Apply 1 Application topically 2 (two) times daily as needed (for rashes). Patient not taking: Reported on 06/16/2023 04/06/23   Adline Potter, NP  omeprazole (PRILOSEC) 40 MG capsule  Take 40 mg by mouth daily.    [provider]  ondansetron (ZOFRAN) 4 MG tablet Take 1 tablet (4 mg total) by mouth every 8 (eight) hours as needed for nausea or vomiting. For cancer related nausea 02/16/23   Lovorn, Aundra Millet, MD  OXcarbazepine (TRILEPTAL) 150 MG tablet Take 1 tablet (150 mg total) by mouth 2 (two) times daily. 06/13/22   Marguerita Merles Latif, DO  Oxycodone HCl 20 MG TABS Take 1 tablet by mouth 2 (two) times daily as needed. 04/16/23   [provider]  pantoprazole (PROTONIX) 40 MG tablet Take 40 mg by mouth daily as needed (for reflux).    [provider]  potassium chloride (KLOR-CON) 10 MEQ tablet Take 1 tablet (10 mEq total) by mouth daily. 02/05/23 06/11/23  Sharlene Dory, PA-C  predniSONE (DELTASONE) 20 MG tablet Take 1  tablet (20 mg total) by mouth 2 (two) times daily with a meal. 06/15/23   Mechele Claude, MD  pregabalin (LYRICA) 200 MG capsule Take 200 mg by mouth 2 (two) times daily.    [provider]  promethazine (PHENERGAN) 25 MG tablet Take 0.5-1 tablets (12.5-25 mg total) by mouth every 6 (six) hours as needed for refractory nausea / vomiting (cancer related nausea/vomiting). 02/16/23   Lovorn, Aundra Millet, MD  promethazine-dextromethorphan (PROMETHAZINE-DM) 6.25-15 MG/5ML syrup Take 5 mLs by mouth 4 (four) times daily as needed for cough. 06/15/23   Mechele Claude, MD  rOPINIRole (REQUIP) 1 MG tablet Take 1 tablet (1 mg total) by mouth at bedtime. For leg cramps 05/12/23   Mechele Claude, MD  senna-docusate (SENOKOT-S) 8.6-50 MG tablet Take 1 tablet by mouth at bedtime. Patient taking differently: Take 1 tablet by mouth at bedtime as needed (for constipation). 06/13/22   Marguerita Merles Latif, DO  spironolactone (ALDACTONE) 25 MG tablet Take 0.5 tablets (12.5 mg total) by mouth daily. 01/31/23 06/11/23  Narda Bonds, MD  tirzepatide [facility]) 7.5 MG/0.5ML Pen Inject 7.5 mg into the skin once a week. 05/22/23   Shamleffer, Konrad Dolores, MD  TYLENOL  500 MG tablet Take 500-1,000 mg by mouth every 6 (six) hours as needed for mild pain (pain score 1-3) or headache.    [provider]  Vitamin D, Cholecalciferol, 25 MCG (1000 UT) TABS Take 1,000 Units by mouth daily in the afternoon. 07/26/20   [provider]      Allergies    Iodine, Ivp dye [iodinated contrast media], Latex, Tape, and Tizanidine    Review of Systems   Review of Systems  Cardiovascular:  Positive for chest pain.  Review of systems completed and notable as per HPI.  ROS otherwise negative.   Physical Exam Updated Vital Signs BP (!) 140/87   Pulse 95   Temp 98.1 F (36.7 C) (Oral)   Resp 20   SpO2 93%  Physical Exam Vitals and nursing note reviewed.  Constitutional:      General: She is not in acute distress.    Appearance: She is well-developed.  HENT:     Head: Normocephalic and atraumatic.  Eyes:     Conjunctiva/sclera: Conjunctivae normal.  Cardiovascular:     Rate and Rhythm: Normal rate and regular rhythm.     Pulses: Normal pulses.     Heart sounds: Normal heart sounds. No murmur heard. Pulmonary:     Effort: Pulmonary effort is normal. No respiratory distress.     Breath sounds: Normal breath sounds.  Abdominal:     Palpations: Abdomen is soft.     Tenderness: There is no abdominal tenderness.  Musculoskeletal:        General: No swelling.     Cervical back: Neck supple.  Skin:    General: Skin is warm and dry.     Capillary Refill: Capillary refill takes less than 2 seconds.  Neurological:     General: No focal deficit present.     Mental Status: She is alert and oriented to person, place, and time. Mental status is at baseline.  Psychiatric:        Mood and Affect: Mood normal.     ED Results / Procedures / Treatments   Labs (all labs ordered are listed, but only abnormal results are displayed) Labs Reviewed  BASIC METABOLIC PANEL - Abnormal; Notable for the following components:      Result Value   Glucose, Bld  205 (*)    Calcium 8.6 (*)    All other components within normal limits  CBC - Abnormal; Notable for the following components:   RBC 3.28 (*)    Hemoglobin 10.3 (*)    HCT 32.6 (*)    RDW 17.5 (*)    All other components within normal limits  HEPATIC FUNCTION PANEL - Abnormal; Notable for the following components:   Total Protein 6.1 (*)    All other components within normal limits  LIPASE, BLOOD  TROPONIN I (HIGH SENSITIVITY)  TROPONIN I (HIGH SENSITIVITY)    EKG EKG Interpretation Date/Time:  Friday June 26 2023 16:38:27 EST Ventricular Rate:  96 PR Interval:    QRS Duration:  66 QT Interval:  314 QTC Calculation: 396 R Axis:   76  Text Interpretation: Atrial flutter with variable A-V block Abnormal ECG When compared with ECG of 26-Jun-2023 16:37, Atrial flutter has replaced Atrial fibrillation Confirmed by Fulton Reek [id number]) on 06/26/2023 4:43:39 PM  Radiology CT Angio Chest/Abd/Pel for Dissection W and/or W/WO Result Date: 06/26/2023 CLINICAL DATA:  Severe chest pain EXAM: CT ANGIOGRAPHY CHEST, ABDOMEN AND PELVIS TECHNIQUE: Non-contrast CT of the chest was initially obtained. Multidetector CT imaging through the chest, abdomen and pelvis was performed using the standard protocol during bolus administration of intravenous contrast. Multiplanar reconstructed images and MIPs were obtained and reviewed to evaluate the vascular anatomy. RADIATION DOSE REDUCTION: This exam was performed according to the departmental dose-optimization program which includes automated exposure control, adjustment of the mA and/or kV according to patient size and/or use of iterative reconstruction technique. CONTRAST:  OMNIPAQUE IOHEXOL 350 MG/ML SOLN COMPARISON:  CT 06/26/2023, 06/10/2023 FINDINGS: CTA CHEST FINDINGS Cardiovascular: Non contrasted images of the chest demonstrate no acute intramural hematoma. Negative for aneurysm or dissection. Mild atherosclerosis. Cardiomegaly. No  significant pericardial effusion. Mitral annular calcification. Mediastinum/Nodes: Patent trachea. No thyroid mass. Multiple subcentimeter mediastinal lymph nodes. Esophagus within normal limits Lungs/Pleura: Minimal peribronchovascular nodularity in the right upper lobe. No consolidative process, pleural effusion or pneumothorax Musculoskeletal: No acute or suspicious osseous abnormality. Mild chronic superior endplate deformity T11. Review of the MIP images confirms the above findings. CTA ABDOMEN AND PELVIS FINDINGS VASCULAR Aorta: Normal caliber aorta without aneurysm, dissection, vasculitis or significant stenosis. Celiac: Patent without evidence of aneurysm, dissection, vasculitis or significant stenosis. Origin calcification SMA: Patent without evidence of aneurysm, dissection, vasculitis or significant stenosis. Renals: Both renal arteries are patent without evidence of aneurysm, dissection, vasculitis, fibromuscular dysplasia or significant stenosis. IMA: Patent without evidence of aneurysm, dissection, vasculitis or significant stenosis. Inflow: Patent without evidence of aneurysm, dissection, vasculitis or significant stenosis. Veins: Suboptimally evaluated Review of the MIP images confirms the above findings. NON-VASCULAR Hepatobiliary: No focal liver abnormality is seen. No gallstones, gallbladder wall thickening, or biliary dilatation. Pancreas: Unremarkable. No pancreatic ductal dilatation or surrounding inflammatory changes. Spleen: Normal in size without focal abnormality. Adrenals/Urinary Tract: Adrenal glands are unremarkable. Kidneys are normal, without renal calculi, focal lesion, or hydronephrosis. Bladder is unremarkable. Stomach/Bowel: Stomach nonenlarged. No dilated small bowel. No acute bowel wall thickening. Lymphatic: No suspicious lymph nodes Reproductive: Uterus and bilateral adnexa are unremarkable. Other: Negative for pelvic effusion or free air Musculoskeletal: Mild chronic  compression deformities at T9, T11, T12 and L2. 1 Review of the MIP images confirms the above findings. IMPRESSION: 1. Negative for aortic dissection or aneurysm. 2. Cardiomegaly. 3. Minimal peribronchovascular nodularity in the right upper lobe, suggesting infectious or inflammatory bronchiolitis 4. No CT evidence for acute intra-abdominal  or pelvic abnormality. Electronically Signed   By: Jasmine Pang M.D.   On: 06/26/2023 22:09   CT Chest Wo Contrast Result Date: 06/26/2023 CLINICAL DATA:  Aortic aneurysm suspected. Severe chest pain with enlarged cardiac silhouette. Contrast allergy. Shortness of breath. EXAM: CT CHEST WITHOUT CONTRAST TECHNIQUE: Multidetector CT imaging of the chest was performed following the standard protocol without IV contrast. RADIATION DOSE REDUCTION: This exam was performed according to the departmental dose-optimization program which includes automated exposure control, adjustment of the mA and/or kV according to patient size and/or use of iterative reconstruction technique. COMPARISON:  Chest radiograph 06/26/2023.  CT chest 06/10/2023 FINDINGS: Cardiovascular: Normal caliber thoracic aorta with maximal diameter of the ascending aorta measured at 3.7 cm. No change since prior study. Cardiac enlargement. No pericardial effusion. Calcification in the mitral valve annulus, aorta, and coronary arteries. Mediastinum/Nodes: Scattered mediastinal lymph nodes are not pathologically enlarged, likely reactive and unchanged. Esophagus is decompressed. Thyroid gland is unremarkable. Lungs/Pleura: Areas of atelectasis or scarring in the lung bases. Fine nodular peribronchial infiltrates in the right upper lung most likely representing early bronchopneumonia. Mild subpleural fibrosis. No pleural effusion or pneumothorax. Upper Abdomen: No acute abnormalities. Musculoskeletal: Degenerative changes in the spine. Postoperative right shoulder arthroplasty. IMPRESSION: 1. Normal caliber of the  thoracic aorta.  No aneurysm identified. 2. Cardiac enlargement. 3. Fine peribronchial nodularity in the right upper lung likely representing early bronchopneumonia. 4. Slight fibrosis or atelectasis in the lung bases. 5. Aortic atherosclerosis. Electronically Signed   By: Burman Nieves M.D.   On: 06/26/2023 17:53   DG Chest 2 View Result Date: 06/26/2023 CLINICAL DATA:  Chest pain EXAM: CHEST - 2 VIEW COMPARISON:  X-ray 06/10/2023 and CTA FINDINGS: Enlarged cardiopericardial silhouette. Chronic interstitial lung changes. No pneumothorax, effusion or edema. No consolidation. Curvature and degenerative changes along the spine. Right shoulder reverse arthroplasty. IMPRESSION: Enlarged cardiopericardial silhouette.  Chronic changes. Electronically Signed   By: Karen Kays M.D.   On: 06/26/2023 17:00    Procedures Procedures    Medications Ordered in ED Medications  metoprolol tartrate (LOPRESSOR) tablet 50 mg (50 mg Oral Given 06/26/23 2209)  cefTRIAXone (ROCEPHIN) 1 g in sodium chloride 0.9 % 100 mL IVPB (has no administration in time range)  methylPREDNISolone sodium succinate (SOLU-MEDROL) 40 mg/mL injection 40 mg (40 mg Intravenous Given 06/26/23 1731)  diphenhydrAMINE (BENADRYL) capsule 50 mg ( Oral See Alternative 06/26/23 2025)    Or  diphenhydrAMINE (BENADRYL) injection 50 mg (50 mg Intravenous Given 06/26/23 2025)  fentaNYL (SUBLIMAZE) injection 50 mcg (50 mcg Intravenous Given 06/26/23 1731)  fentaNYL (SUBLIMAZE) injection 50 mcg (50 mcg Intravenous Given 06/26/23 1859)  morphine (PF) 4 MG/ML injection 4 mg (4 mg Intravenous Given 06/26/23 2120)  iohexol (OMNIPAQUE) 350 MG/ML injection 100 mL (100 mLs Intravenous Contrast Given 06/26/23 2136)  oxyCODONE (Oxy IR/ROXICODONE) immediate release tablet 20 mg (20 mg Oral Given 06/26/23 2210)    ED Course/ Medical Decision Making/ A&P Clinical Course as of 06/26/23 2240  Fri Jun 26, 2023  1819 Troponin normal.  Patient still has persistent  chest pain.  Noncontrast CT chest obtained which does not show any acute abnormality.  We are working on premedication protocol for dissection scan. [JD]  1951 Patient reassessed, significantly improved pain.  She is hemodynamically stable. [JD]  2041 Remained stable.  Pain is present but still improved from earlier.  Troponin is negative x 2, lower concern for ACS.  She has received Benadryl, waiting for CT scan. [JD]  2152 Patient  tolerated CT without issue.  I do not see a large aortic dissection.  She is due for home metoprolol which I ordered.  She also states she takes oxycodone 20 mg regularly for pain.  She also reports now that she was diagnosed with RSV earlier this week and has had significant cough and has some pain when she coughs. [JD]    Clinical Course User Index [JD] Laurence Spates, MD                                 Medical Decision Making Amount and/or Complexity of Data Reviewed Labs: ordered. Radiology: ordered.  Risk Prescription drug management.   Medical Decision Making:   EMELIA SANDOVAL is a 78 y.o. female who presented to the ED today with chest pain.  All signs reviewed.  EKG shows A-fib versus a flutter, no acute ischemic changes.  Will obtain troponin for evaluation of ACS.  Her description of pain radiating to her back and abdomen is concerning for possible dissection.  He is not markedly hypertensive or hypotensive.  Unfortunate she has anaphylaxis to contrast.  She was recently premedicated over the 4-hour protocol and had no symptoms with CT contrast.  I had a conversation with the patient and expressed my concern and need for further imaging to rule out dissection.  We discussed risk for serious allergic reaction including anaphylaxis which could be life-threatening, as well as the risk of missing this diagnosis.  Patient was comfortable with the pretreatment and CT scan with contrast given she just had a CT scan not long ago with pretreatment and did well.   Chest she states it was actually an MRI that she initially had a reaction to, and she is not sure if she ever had a CT scan reaction although it is listed as an iodine allergy.  Will plan for pretreatment.  I will get a dry CT chest initially as well while we are waiting.  Chest x-ray reviewed, she does have enlarged cardiac silhouette.  It appears similar to prior, did a bedside ultrasound showed good EF, possible small pericardial effusion.   Patient placed on continuous vitals and telemetry monitoring while in ED which was reviewed periodically.  Reviewed and confirmed nursing documentation for past medical history, family history, social history.  Reassessment and Plan:   Patient remained stable on serial reassessment her pain is improved although she still has some chest pain.  Troponin negative x 2.  I did a dry CT scan which showed no pericardial effusion but slight cardiomegaly.  Possible pneumonia.  Aortic contour is normal without obvious signs of dissection.  She is on pretreatment protocol for dissection scan.  CT scan with questionable pneumonia.  No dissection or significant PE.  She is having persisting cough.  No leukocytosis or signs of sepsis.  Will give her a dose of Rocephin here and treat for possible pneumonia.  She is nearly pain-free at this point.  Her CT scan is very reassuring, no signs of dissection, large PE or other acute abnormality.  She feels well and really wants to go home.  I think this is reasonable, her oxygen is good, she is not short of breath, she has had 2 negative troponins and reassuring dissection scan.  She has no wheezing here.  Recommend she follow-up closely with her PCP.  She is comfortable this plan.  Will discharge.  Husband is able to drive her  home.  Given strict return precautions.  She was recently admitted for pneumonia earlier this month and received Augmentin.  Will treat with Augmentin, azithromycin for possible atypical pneumonia.   Patient's  presentation is most consistent with acute complicated illness / injury requiring diagnostic workup.           Final Clinical Impression(s) / ED Diagnoses Final diagnoses:  Chest pain, unspecified type  Community acquired pneumonia of right upper lobe of lung    Rx / DC Orders ED Discharge Orders          Ordered    amoxicillin-clavulanate (AUGMENTIN) 875-125 MG tablet  Every 12 hours        06/26/23 2239    azithromycin (ZITHROMAX Z-PAK) 250 MG tablet  Daily        06/26/23 2239              Laurence Spates, MD 06/26/23 2240

## 2023-06-26 NOTE — ED Triage Notes (Signed)
Chest pain sob started this afternoon, woke with pain. Central chest pain into back

## 2023-06-26 NOTE — ED Notes (Signed)
Nurse and MD accompanied pt to CT scan for contrast allergy and risk of anaphylaxis. Pt was premedicated with solumedrol and benadryl prior to scan. Pt VSS throughout CT and now after. Pt denies any new SOB or difficulty breathing or oral swelling since CT contrast. Pt is sitting in bed no signs of distress, pt's husband at bedside.

## 2023-06-29 ENCOUNTER — Telehealth: Payer: Self-pay

## 2023-06-29 ENCOUNTER — Ambulatory Visit (INDEPENDENT_AMBULATORY_CARE_PROVIDER_SITE_OTHER): Payer: Medicare Other

## 2023-06-29 DIAGNOSIS — E1142 Type 2 diabetes mellitus with diabetic polyneuropathy: Secondary | ICD-10-CM

## 2023-06-29 DIAGNOSIS — J45901 Unspecified asthma with (acute) exacerbation: Secondary | ICD-10-CM | POA: Diagnosis not present

## 2023-06-29 DIAGNOSIS — I272 Pulmonary hypertension, unspecified: Secondary | ICD-10-CM

## 2023-06-29 DIAGNOSIS — I110 Hypertensive heart disease with heart failure: Secondary | ICD-10-CM | POA: Diagnosis not present

## 2023-06-29 DIAGNOSIS — G4733 Obstructive sleep apnea (adult) (pediatric): Secondary | ICD-10-CM

## 2023-06-29 DIAGNOSIS — I2510 Atherosclerotic heart disease of native coronary artery without angina pectoris: Secondary | ICD-10-CM | POA: Diagnosis not present

## 2023-06-29 DIAGNOSIS — B974 Respiratory syncytial virus as the cause of diseases classified elsewhere: Secondary | ICD-10-CM

## 2023-06-29 DIAGNOSIS — J9611 Chronic respiratory failure with hypoxia: Secondary | ICD-10-CM | POA: Diagnosis not present

## 2023-06-29 DIAGNOSIS — I700 Atherosclerosis of aorta: Secondary | ICD-10-CM | POA: Diagnosis not present

## 2023-06-29 DIAGNOSIS — J324 Chronic pansinusitis: Secondary | ICD-10-CM

## 2023-06-29 DIAGNOSIS — I5032 Chronic diastolic (congestive) heart failure: Secondary | ICD-10-CM | POA: Diagnosis not present

## 2023-06-29 DIAGNOSIS — I4891 Unspecified atrial fibrillation: Secondary | ICD-10-CM | POA: Diagnosis not present

## 2023-06-29 MED ORDER — TIRZEPATIDE 10 MG/0.5ML ~~LOC~~ SOAJ: 10.0000 mg | SUBCUTANEOUS | 3 refills | Status: DC

## 2023-06-29 NOTE — Telephone Encounter (Signed)
Patient would like to have an increase prescription of the [facility] sent to [facility].

## 2023-06-30 ENCOUNTER — Telehealth: Payer: Self-pay

## 2023-06-30 ENCOUNTER — Ambulatory Visit (INDEPENDENT_AMBULATORY_CARE_PROVIDER_SITE_OTHER): Payer: Medicare Other

## 2023-06-30 ENCOUNTER — Encounter: Payer: Self-pay | Admitting: Family

## 2023-06-30 ENCOUNTER — Ambulatory Visit: Payer: Self-pay | Admitting: Family Medicine

## 2023-06-30 ENCOUNTER — Other Ambulatory Visit: Payer: Self-pay | Admitting: Radiation Oncology

## 2023-06-30 ENCOUNTER — Ambulatory Visit (INDEPENDENT_AMBULATORY_CARE_PROVIDER_SITE_OTHER): Payer: Medicare Other | Admitting: Family

## 2023-06-30 VITALS — BP 135/83 | HR 95 | Temp 98.2°F | Ht 62.0 in | Wt 185.6 lb

## 2023-06-30 DIAGNOSIS — M79676 Pain in unspecified toe(s): Secondary | ICD-10-CM | POA: Diagnosis not present

## 2023-06-30 DIAGNOSIS — M79674 Pain in right toe(s): Secondary | ICD-10-CM | POA: Diagnosis not present

## 2023-06-30 MED ORDER — ALPRAZOLAM 1 MG PO TABS
2.0000 mg | ORAL_TABLET | ORAL | 0 refills | Status: AC
Start: 2023-07-05 — End: 2023-07-05

## 2023-06-30 NOTE — Progress Notes (Signed)
Subjective:    Patient ID: Amber Stephenson, female    DOB: 1945-07-01, 78 y.o.   MRN: [id number]  Chief Complaint  Patient presents with   Foot Injury    STUMPED TOE ON RIGHT FOOT   PT presents to the office today with fifth toe pain that started today after hitting in on a door.  Foot Injury  The incident occurred 6 to 12 hours ago. The incident occurred at home. The injury mechanism was a direct blow. Pain location: right fifth toe. The quality of the pain is described as stabbing and shooting. The pain is at a severity of 9/10. The pain is moderate. The pain has been Intermittent since onset. Pertinent negatives include no loss of motion, loss of sensation, numbness or tingling. She reports no foreign bodies present. The symptoms are aggravated by movement and weight bearing. Treatments tried: oxycodone. The treatment provided mild relief.      Review of Systems  Neurological:  Negative for tingling and numbness.  All other systems reviewed and are negative.   Social History   Socioeconomic History   Marital status: Married    Spouse name: Karren Burly   Number of children: 3   Years of education: 15   Highest education level: Associate degree: academic program  Occupational History   Occupation: Retired    Comment: Chief Financial Officer  Tobacco Use   Smoking status: Never   Smokeless tobacco: Never  Vaping Use   Vaping status: Never Used  Substance and Sexual Activity   Alcohol use: Not Currently    Alcohol/week: 0.0 standard drinks of alcohol   Drug use: No   Sexual activity: Yes    Partners: Male    Birth control/protection: Post-menopausal  Other Topics Concern   Not on file  Social History Narrative   Lives at home with husband.    They have 3 children who live away - [place], [place], and [place].   She is from [place] and most of her family lives there.   Her husbands's family live nearby   Right handed.   Social Drivers of Corporate investment banker Strain:  Low Risk  (01/23/2023)   Overall Financial Resource Strain (CARDIA)    Difficulty of Paying Living Expenses: Not hard at all  Food Insecurity: No Food Insecurity (06/16/2023)   Hunger Vital Sign    Worried About Running Out of Food in the Last Year: Never true    Ran Out of Food in the Last Year: Never true  Transportation Needs: No Transportation Needs (06/16/2023)   PRAPARE - Administrator, Civil Service (Medical): No    Lack of Transportation (Non-Medical): No  Physical Activity: Inactive (01/23/2023)   Exercise Vital Sign    Days of Exercise per Week: 0 days    Minutes of Exercise per Session: 0 min  Stress: No Stress Concern Present (01/23/2023)   [employer] of Occupational Health - Occupational Stress Questionnaire    Feeling of Stress : Not at all  Social Connections: Unknown (06/10/2023)   Social Connection and Isolation Panel [NHANES]    Frequency of Communication with Friends and Family: More than three times a week    Frequency of Social Gatherings with Friends and Family: Patient declined    Attends Religious Services: Patient declined    Database administrator or Organizations: Patient declined    Attends Banker Meetings: Patient declined    Marital Status: Married   Family History  Problem Relation  Age of Onset   Diabetes Mother    Heart disease Mother    Alzheimer's disease Father    Heart disease Sister        CABG   Diabetes Sister    Alcohol abuse Sister    Stroke Brother    Heart disease Brother    Mental illness Brother    Diabetes Brother    Breast cancer Neg Hx    Ovarian cancer Neg Hx    Colon cancer Neg Hx    Endometrial cancer Neg Hx         Objective:   Physical Exam Vitals reviewed.  Constitutional:      General: She is not in acute distress.    Appearance: She is well-developed.  HENT:     Head: Normocephalic and atraumatic.  Eyes:     Pupils: Pupils are equal, round, and reactive to light.  Neck:      Thyroid: No thyromegaly.  Cardiovascular:     Rate and Rhythm: Normal rate and regular rhythm.     Heart sounds: Normal heart sounds. No murmur heard. Pulmonary:     Effort: Pulmonary effort is normal. No respiratory distress.     Breath sounds: Normal breath sounds. No wheezing.  Abdominal:     General: Bowel sounds are normal. There is no distension.     Palpations: Abdomen is soft.     Tenderness: There is no abdominal tenderness.  Musculoskeletal:        General: No tenderness. Normal range of motion.     Cervical back: Normal range of motion and neck supple.       Feet:  Feet:     Comments: Right fifth toe pain with palpation  Skin:    General: Skin is warm and dry.  Neurological:     Mental Status: She is alert and oriented to person, place, and time.     Cranial Nerves: No cranial nerve deficit.     Deep Tendon Reflexes: Reflexes are normal and symmetric.  Psychiatric:        Behavior: Behavior normal.        Thought Content: Thought content normal.        Judgment: Judgment normal.       BP 135/83   Pulse 95   Temp 98.2 F (36.8 C)   Ht 5\' 2"  (1.575 m)   Wt 185 lb 9.6 oz (84.2 kg)   SpO2 95%   BMI 33.95 kg/m      Assessment & Plan:  CALIANNA KIM comes in today with chief complaint of Foot Injury (STUMPED TOE ON RIGHT FOOT)   Diagnosis and orders addressed:  1. Pain of fifth toe (Primary) X-ray pending Buddy tape fourth and fifth toe Avoid injury Rest Continue pain medication Follow up if symptoms worsen or do not improve  - DG Toe 5th Right; Future     Jannifer Rodney, FNP

## 2023-06-30 NOTE — Patient Instructions (Signed)
Toe Fracture A toe fracture is a break in one of the toe bones (phalanges). A toe fracture may be: A crack in the surface of the bone (stress fracture). This often occurs in athletes. A break all the way through the bone (complete fracture). What are the causes? This condition may be caused by: Direct impact, such as from dropping a heavy object on your toe. Stubbing your toe. Twisting or stretching your toe out of place. Overuse or repeated exercise. What increases the risk? You are more likely to develop this condition if you: Play contact sports. Have weak bones (osteoporosis). Have a low calcium level. What are the signs or symptoms? The main symptoms of this condition are swelling and pain in the toe. Other symptoms include: Bruising. Stiffness. Numbness. A change in the way the toe looks. Broken bones that poke through the skin. Blood under the toenail. How is this diagnosed? This condition is diagnosed with a physical exam. You may also have X-rays. How is this treated? Treatment for this condition depends on the type of fracture and its severity. Treatment may include: Taping the broken toe to a toe that is next to it (buddy taping). This is the most common treatment for fractures when the bone has not moved out of place (non-displaced fracture). Wearing a shoe that has a wide, rigid sole to protect the toe and to limit its movement. Wearing a walking cast. A procedure to move the toe back into place. Surgery. This may be needed if: The pieces of broken bone are out of place (displaced). The bone breaks through the skin. Physical therapy exercises to improve movement and strength in the toe. You may need follow-up X-rays to make sure that the bone is healing well and staying in position. Follow these instructions at home: If you have a removable shoe: Wear the shoe as told by your health care provider. Remove it only as told by your provider. Check the skin around the  shoe every day. Tell your provider about any concerns. Loosen the shoe if your toes tingle, become numb, or turn cold and blue. Keep the shoe clean and dry. If you have a nonremovable cast: Do not put pressure on any part of the cast until it is fully hardened. This may take several hours. Do not stick anything inside the cast to scratch your skin. Doing that increases your risk of infection. Check the skin around the cast every day. Tell your provider about any concerns. You may put lotion on dry skin around the edges of the cast. Do not put lotion on the skin underneath the cast. You may put lotion on dry skin around the edges of the cast. Do not put lotion on the skin underneath the cast. Keep the cast clean and dry. Bathing Do not take baths, swim, or use a hot tub until your provider approves. Ask your provider if you may take showers. If the shoe or cast is not waterproof: Do not let it get wet. Cover it with a watertight covering when you take a bath or shower. Activity Do not use the injured foot to support your body weight until your provider says that you can. Use crutches as told by your provider. Ask your provider: What activities are safe for you during recovery. What activities you need to avoid. Do physical therapy exercises as told by your provider. Driving Ask your provider if the medicine prescribed to you requires you to avoid driving or using machinery. Do not  drive while wearing a cast on a foot that you use for driving. Managing pain, stiffness, and swelling  If told, put ice on the injured area. If you have a removable shoe, remove it as told by your provider. Put ice in a plastic bag. Place a towel between your skin and the bag or between your cast and the bag. Leave the ice on for 20 minutes, 2-3 times a day. If your skin turns bright red, remove the ice right away to prevent skin damage. The risk of damage is higher if you cannot feel pain, heat, or  cold. Raise (elevate) the injured area above the level of your heart while you are sitting or lying down. General instructions If your toe was treated with buddy taping, follow your provider's instructions for changing the gauze and tape. Change it more often if: The gauze and tape get wet. If this happens, dry the space between the toes. The gauze and tape are too tight and cause your toe to become pale or numb. If you were not given a protective shoe, wear sturdy, supportive shoes. Your shoes should not pinch your toes and should not fit tightly against your toes. Do not use any products that contain nicotine or tobacco. These products include cigarettes, chewing tobacco, and vaping devices, such as e-cigarettes. These can delay bone healing. If you need help quitting, ask your provider. Take over-the-counter and prescription medicines only as told by your provider. Keep all follow-up visits. Your provider will check to see how your toe is healing. Contact a health care provider if you have: Pain that gets worse or does not get better with medicine. A fever. A bad smell coming from your cast. Get help right away if you have: Any of the following in your toes or your foot: Numbness or tingling that gets worse. Coldness. Blue skin. Redness or swelling that gets worse. Pain that suddenly becomes severe. This information is not intended to replace advice given to you by your health care provider. Make sure you discuss any questions you have with your health care provider. Document Revised: 06/09/2022 Document Reviewed: 04/10/2022 Elsevier Patient Education  2024 [employer].

## 2023-06-30 NOTE — Telephone Encounter (Signed)
  Chief Complaint: toe pain Symptoms: swelling to pinkie toe Frequency: occurred an hour ago Pertinent Negatives: Patient denies bruising Disposition: [] ED /[] Urgent Care (no appt availability in office) / [x] Appointment(In office/virtual)/ []   Virtual Care/ [] Home Care/ [] Refused Recommended Disposition /[]  Mobile Bus/ []  Follow-up with PCP Additional Notes: patient called with c/o toe pain. Patient states she jammed her pinkie toe with the bathroom door. Patient is concerned that she possibly broke her pinkie toe because of the pain level of 9 out of 10. Patient is already on chronic pain medication which isn't helping this pain. Per protocol, recommendation would be appointment for today. Same day appointment made for today at 3:35 pm. Patient verbalized understanding of plan and all questions answered.    Copied from CRM [id number]. Topic: Clinical - Red Word Triage >> Jun 30, 2023  9:51 AM Fuller Mandril wrote: Red Word that prompted transfer to Nurse Triage: Stubbed toe - possible break or fracture pain at 9 Reason for Disposition  [1] SEVERE pain AND [2] not improved 2 hours after pain medicine/ice packs  Answer Assessment - Initial Assessment Questions 1. MECHANISM: "How did the injury happen?"      Jammed foot into the bathroom door 2. ONSET: "When did the injury happen?" (Minutes or hours ago)      Occurred about an hour ago. 3. LOCATION: "What part of the toe is injured?" "Is the nail damaged?"      Pinkie toe-toenail is discolored. 4. APPEARANCE of TOE INJURY: "What does the injury look like?"      Pinkie toe is swollen 5. SEVERITY: "Can you use the foot normally?" "Can you walk?"      Unable to walk normally 6. SIZE: For cuts, bruises, or swelling, ask: "How large is it?" (e.g., inches or centimeters;  entire toe)      No cuts or bruises-entire pinkie toe is swollen 7. PAIN: "Is there pain?" If Yes, ask: "How bad is the pain?"   (e.g., Scale 1-10; or mild,  moderate, severe)     9 out of 10-when she walks on it 8. TETANUS: For any breaks in the skin, ask: "When was the last tetanus booster?"     Has had one in the last five years 9. DIABETES: "Do you have a history of diabetes or poor circulation in the feet?"     Hx of diabetes 10. OTHER SYMPTOMS: "Do you have any other symptoms?"        No other symptoms  Protocols used: Toe Injury-A-AH

## 2023-06-30 NOTE — Telephone Encounter (Signed)
Patient called in requesting prescription for Xanax prior to her MRI on Sunday. Per patient she normally takes 2 tablets to assist with claustrophobia with all scans. Patient requesting medication be sent to [facility] on [address] In [place].

## 2023-06-30 NOTE — Telephone Encounter (Signed)
Copied from CRM [id number]. Topic: Clinical - Medication Question >> Jun 30, 2023  8:36 AM Gaetano Hawthorne wrote: Reason for CRM: Patient would like to know if her Prolia injection has been ordered to arrive at the office and if she can make an appointment - according to her chart, she has been approved.

## 2023-07-01 ENCOUNTER — Other Ambulatory Visit: Payer: Self-pay | Admitting: *Deleted

## 2023-07-01 NOTE — Patient Outreach (Signed)
Care Management  Transitions of Care Program Transitions of Care Post-discharge week 3   07/01/2023 Name: Amber Stephenson MRN: [id number] DOB: [birth year]  Subjective: Amber Stephenson is a 78 y.o. year old female who is a primary care patient of Stacks, Broadus John, MD. The Care Management team Engaged with patient Engaged with patient by telephone to assess and address transitions of care needs.   Consent to Services:  Patient was given information about care management services, agreed to services, and gave verbal consent to participate.   Assessment: Pt states "feeling much better", finished one antibiotic, finished prednisone, continues using cough syrup as needed, denies any falls since last conversation, has a cane but is not using.         SDOH Interventions    Flowsheet Row Telephone from 06/16/2023 in [place] [facility] DEPARTMENT Telephone from 02/03/2023 in [facility] Community [facility] Coordination Clinical Support from 01/23/2023 in [facility] Telephone from 01/22/2023 in [facility] Care Coordination Telephone from 11/13/2022 in [facility] Telephone from 09/05/2022 in [facility]  SDOH Interventions        Food Insecurity Interventions Intervention Not Indicated Intervention Not Indicated Intervention Not Indicated Intervention Not Indicated Intervention Not Indicated Intervention Not Indicated  Housing Interventions Intervention Not Indicated -- Intervention Not Indicated -- Intervention Not Indicated Intervention Not Indicated  Transportation Interventions Intervention Not Indicated Intervention Not Indicated Intervention Not Indicated Intervention Not Indicated Intervention Not Indicated, Patient Resources Dietitian) --  Utilities Interventions Intervention Not Indicated Intervention Not Indicated Intervention Not  Indicated -- -- --  Alcohol Usage Interventions -- -- Intervention Not Indicated (Score <7) -- -- --  Financial Strain Interventions -- -- Intervention Not Indicated -- -- --  Stress Interventions -- -- Intervention Not Indicated -- -- --  Social Connections Interventions -- -- Intervention Not Indicated -- -- --  Health Literacy Interventions -- -- Intervention Not Indicated -- -- --        Goals Addressed             This Visit's Progress    Transition of Care/ pt will have no readmissions within 30 days       Current Barriers:  Home Health services [facility] continue to work with pt. Knowledge Deficits related to plan of care for management of RSV- pt is eating and drinking well. Patient reports she is feeling much better, went to ED on 06/26/23 for chest pain and states " it was related to the pneumonia but is better now"  pt reports she has finished prednisone and continues on one of her antibiotics and cough syrup prn.  Patient reports that she is using 3 liters of oxygen at night and as needed during the day.   RNCM Clinical Goal(s):  Patient will work with the Care Management team over the next 30 days to address Transition of Care Barriers: Home Health services verbalize understanding of plan for management of RSV as evidenced by pt report, review of EMR and  through collaboration with RN Care manager, provider, and care team.   Interventions: Evaluation of current treatment plan related to  self management and patient's adherence to plan as established by provider RSV  (Status:  Goal on track:  Yes.)  Short Term Goal Evaluation of current treatment plan related to  RSV ,  home health has not seen patient,   self-management and patient's adherence to plan as  established by provider. Discussed plans with patient for ongoing care management follow up and provided patient with direct contact information for care management team Reviewed all upcoming scheduled  appointments Reinforced with patient importance of healthy diet. Reinforced with patient to continue to take her medications as prescribed and call MD for additional concerns. Reviewed safety precautions, importance of using cane  Patient Goals/Self-Care Activities: Participate in Transition of Care Program/Attend TOC scheduled calls Notify RN Care Manager of TOC call rescheduling needs Take all medications as prescribed Attend all scheduled provider appointments Call pharmacy for medication refills 3-7 days in advance of running out of medications Perform all self care activities independently  Call provider office for new concerns or questions  Please contact your doctor for unresolved/ worsening symptoms- cough, wheezing, fever Call 911 for shortness of breath, chest pain Use oxygen as prescribed Please use your cane, keep pathways clear, do not get in a hurry and ask for assistance as needed  Follow Up Plan:  Telephone follow up appointment with care management team member scheduled for:  07/08/23 @ 10: 15 am          Plan: Telephone follow up appointment with care management team member scheduled for: 07/08/23 @ 1015 am  Irving Shows [facility], BSN RN Care Manager/ Transition of Care [place]/ [facility] [phone]

## 2023-07-02 DIAGNOSIS — E1142 Type 2 diabetes mellitus with diabetic polyneuropathy: Secondary | ICD-10-CM | POA: Diagnosis not present

## 2023-07-02 DIAGNOSIS — B974 Respiratory syncytial virus as the cause of diseases classified elsewhere: Secondary | ICD-10-CM | POA: Diagnosis not present

## 2023-07-02 DIAGNOSIS — J45901 Unspecified asthma with (acute) exacerbation: Secondary | ICD-10-CM | POA: Diagnosis not present

## 2023-07-02 DIAGNOSIS — I110 Hypertensive heart disease with heart failure: Secondary | ICD-10-CM | POA: Diagnosis not present

## 2023-07-02 DIAGNOSIS — I5032 Chronic diastolic (congestive) heart failure: Secondary | ICD-10-CM | POA: Diagnosis not present

## 2023-07-02 DIAGNOSIS — J324 Chronic pansinusitis: Secondary | ICD-10-CM | POA: Diagnosis not present

## 2023-07-03 ENCOUNTER — Encounter: Payer: Medicare Other | Attending: Physical Medicine and Rehabilitation | Admitting: Registered Nurse

## 2023-07-03 VITALS — BP 126/85 | HR 90 | Ht 62.0 in | Wt 185.0 lb

## 2023-07-03 DIAGNOSIS — I5032 Chronic diastolic (congestive) heart failure: Secondary | ICD-10-CM | POA: Diagnosis not present

## 2023-07-03 DIAGNOSIS — G8929 Other chronic pain: Secondary | ICD-10-CM

## 2023-07-03 DIAGNOSIS — E1142 Type 2 diabetes mellitus with diabetic polyneuropathy: Secondary | ICD-10-CM | POA: Diagnosis not present

## 2023-07-03 DIAGNOSIS — I110 Hypertensive heart disease with heart failure: Secondary | ICD-10-CM | POA: Diagnosis not present

## 2023-07-03 DIAGNOSIS — J324 Chronic pansinusitis: Secondary | ICD-10-CM | POA: Diagnosis not present

## 2023-07-03 DIAGNOSIS — M79606 Pain in leg, unspecified: Secondary | ICD-10-CM | POA: Diagnosis not present

## 2023-07-03 DIAGNOSIS — G894 Chronic pain syndrome: Secondary | ICD-10-CM

## 2023-07-03 DIAGNOSIS — Z5181 Encounter for therapeutic drug level monitoring: Secondary | ICD-10-CM

## 2023-07-03 DIAGNOSIS — W19XXXD Unspecified fall, subsequent encounter: Secondary | ICD-10-CM | POA: Diagnosis not present

## 2023-07-03 DIAGNOSIS — B974 Respiratory syncytial virus as the cause of diseases classified elsewhere: Secondary | ICD-10-CM | POA: Diagnosis not present

## 2023-07-03 DIAGNOSIS — Z79891 Long term (current) use of opiate analgesic: Secondary | ICD-10-CM | POA: Diagnosis not present

## 2023-07-03 DIAGNOSIS — M79604 Pain in right leg: Secondary | ICD-10-CM

## 2023-07-03 DIAGNOSIS — W19XXXA Unspecified fall, initial encounter: Secondary | ICD-10-CM | POA: Diagnosis not present

## 2023-07-03 DIAGNOSIS — M5441 Lumbago with sciatica, right side: Secondary | ICD-10-CM | POA: Insufficient documentation

## 2023-07-03 DIAGNOSIS — J45901 Unspecified asthma with (acute) exacerbation: Secondary | ICD-10-CM | POA: Diagnosis not present

## 2023-07-03 DIAGNOSIS — M5442 Lumbago with sciatica, left side: Secondary | ICD-10-CM | POA: Diagnosis not present

## 2023-07-03 MED ORDER — OXYCODONE HCL 20 MG PO TABS: 1.0000 | ORAL_TABLET | Freq: Two times a day (BID) | ORAL | 0 refills | Status: DC | PRN

## 2023-07-03 NOTE — Progress Notes (Unsigned)
Subjective:    Patient ID: Amber Stephenson, female    DOB: 09-22-1945, 78 y.o.   MRN: [id number]  HPI: Amber Stephenson is a 78 y.o. female who returns for follow up appointment for chronic pain and medication refill. states *** pain is located in  ***. rates pain ***. current exercise regime is walking and performing stretching exercises.  Ms. Safran Morphine equivalent is *** MME.   UDS ordered today.    Pain Inventory Average Pain 8 Pain Right Now 6 My pain is  throbbing  In the last 24 hours, has pain interfered with the following? General activity 8 Relation with others 0 Enjoyment of life 0 What TIME of day is your pain at its worst? varies Sleep (in general) Good  Pain is worse with: walking, bending, standing, and some activites Pain improves with: heat/ice and medication Relief from Meds: 9  Family History  Problem Relation Age of Onset   Diabetes Mother    Heart disease Mother    Alzheimer's disease Father    Heart disease Sister        CABG   Diabetes Sister    Alcohol abuse Sister    Stroke Brother    Heart disease Brother    Mental illness Brother    Diabetes Brother    Breast cancer Neg Hx    Ovarian cancer Neg Hx    Colon cancer Neg Hx    Endometrial cancer Neg Hx    Social History   Socioeconomic History   Marital status: Married    Spouse name: Financial planner   Number of children: 3   Years of education: 15   Highest education level: Associate degree: academic program  Occupational History   Occupation: Retired    Comment: Chief Financial Officer  Tobacco Use   Smoking status: Never   Smokeless tobacco: Never  Vaping Use   Vaping status: Never Used  Substance and Sexual Activity   Alcohol use: Not Currently    Alcohol/week: 0.0 standard drinks of alcohol   Drug use: No   Sexual activity: Yes    Partners: Male    Birth control/protection: Post-menopausal  Other Topics Concern   Not on file  Social History Narrative   Lives at home with husband.     They have 3 children who live away - [place], [place], and [place].   She is from [place] and most of her family lives there.   Her husbands's family live nearby   Right handed.   Social Drivers of Corporate investment banker Strain: Low Risk  (01/23/2023)   Overall Financial Resource Strain (CARDIA)    Difficulty of Paying Living Expenses: Not hard at all  Food Insecurity: No Food Insecurity (06/16/2023)   Hunger Vital Sign    Worried About Running Out of Food in the Last Year: Never true    Ran Out of Food in the Last Year: Never true  Transportation Needs: No Transportation Needs (06/16/2023)   PRAPARE - Administrator, Civil Service (Medical): No    Lack of Transportation (Non-Medical): No  Physical Activity: Inactive (01/23/2023)   Exercise Vital Sign    Days of Exercise per Week: 0 days    Minutes of Exercise per Session: 0 min  Stress: No Stress Concern Present (01/23/2023)   [employer] of Occupational Health - Occupational Stress Questionnaire    Feeling of Stress : Not at all  Social Connections: Unknown (06/10/2023)   Social Connection and Isolation Panel [  NHANES]    Frequency of Communication with Friends and Family: More than three times a week    Frequency of Social Gatherings with Friends and Family: Patient declined    Attends Religious Services: Patient declined    Database administrator or Organizations: Patient declined    Attends Engineer, structural: Patient declined    Marital Status: Married   Past Surgical History:  Procedure Laterality Date   BREAST REDUCTION SURGERY     EYE SURGERY Right    cateracts   HAMMER TOE SURGERY     LEFT HEART CATH AND CORONARY ANGIOGRAPHY N/A 02/03/2018   Procedure: LEFT HEART CATH AND CORONARY ANGIOGRAPHY;  Surgeon: [name], Peter M, MD;  Location: [facility]IVE [facility];  Service: Cardiovascular;  Laterality: N/A;   REVERSE SHOULDER ARTHROPLASTY Right 08/19/2019   Procedure: REVERSE SHOULDER  ARTHROPLASTY;  Surgeon: Beverely Low, MD;  Location: [facility];  Service: Orthopedics;  Laterality: Right;  interscalene block   SHOULDER SURGERY Right    "I BROKE MY SHOUDLER   THIGH SURGERY     "TO REMOVE A TUMOR "   Past Surgical History:  Procedure Laterality Date   BREAST REDUCTION SURGERY     EYE SURGERY Right    cateracts   HAMMER TOE SURGERY     LEFT HEART CATH AND CORONARY ANGIOGRAPHY N/A 02/03/2018   Procedure: LEFT HEART CATH AND CORONARY ANGIOGRAPHY;  Surgeon: [name], Peter M, MD;  Location: [facility] INVASIVE CV LAB;  Service: Cardiovascular;  Laterality: N/A;   REVERSE SHOULDER ARTHROPLASTY Right 08/19/2019   Procedure: REVERSE SHOULDER ARTHROPLASTY;  Surgeon: Beverely Low, MD;  Location: [facility];  Service: Orthopedics;  Laterality: Right;  interscalene block   SHOULDER SURGERY Right    "I BROKE MY SHOUDLER   THIGH SURGERY     "TO REMOVE A TUMOR "   Past Medical History:  Diagnosis Date   Acquired hammer toe 12/06/2020   Acute metabolic encephalopathy 06/27/2022   Allergic rhinitis 02/24/2019   Ambulatory dysfunction 04/23/2020   Atrial fibrillation with RVR (HCC) 05/19/2017   Back pain/sacroiliitis--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   Benign paroxysmal positional vertigo due to bilateral vestibular disorder 05/20/2019   Bipolar 1 disorder (HCC) 01/23/2015   with GAD, benzo dependence   BPPV (benign paroxysmal positional vertigo) 05/20/2019   CAD (coronary artery disease)    Nonobstructive; Managed by Dr. Purvis Sheffield   Cardiomegaly 01/12/2018   CHF (congestive heart failure) 06/08/2022   Chronic back pain 01/04/2015   Chronic constipation 04/25/2020   Chronic diastolic heart failure (HCC) 05/30/2015   Chronic pain syndrome 08/22/2019   back pain, sacroiliitis   Chronic pain syndrome 08/22/2019   Chronic post-traumatic stress disorder (PTSD) 12/06/2020   Diabetic  neuropathy (HCC) 02/06/2016   Dyslipidemia 09/24/2020   Dyspnea on exertion    Essential hypertension    Family history of coronary arteriosclerosis 05/30/2015   Functional diarrhea 10/26/2020   Head trauma 09/17/2020   Hemorrhoids 03/11/2022   Herpes genitalis in women 07/16/2015   History of adenomatous polyp of colon 05/21/2019   Overview:   03/31/17: Colonoscopy: nonadvanced adenoma, microscopic colitis, f/u 5 yrs, [facility]/GAP   History of radiation therapy    Endometrial- 02/18/23-03/31/23-Dr. Antony Blackbird   Insomnia 01/23/2015   Lipoma 02/08/2015   Migraine headache with aura 02/12/2016   Myofascial pain dysfunction syndrome 08/22/2019   Myofascial pain dysfunction syndrome 08/22/2019  Non-alcoholic fatty liver disease 01/12/2018   Opioid dependence (HCC) 12/06/2020   OSA (obstructive sleep apnea) 02/24/2019   10/09/2018 - HST  - AHI 40.6    Osteopenia 12/06/2020   Rx alendronate 35 mg.   Pinguecula 12/06/2020   Postcoital bleeding 12/06/2020   Presbyopia 12/06/2020   Pulmonary hypertension    RLS (restless legs syndrome) 04/27/2015   Superficial fungus infection of skin 09/03/2021   Tremor, essential 12/11/2021   Type II diabetes mellitus (HCC) 09/24/2020   BP 126/85   Pulse 90   Ht 5\' 2"  (1.575 m)   Wt 185 lb (83.9 kg)   SpO2 (!) 89%   BMI 33.84 kg/m   Opioid Risk Score:   Fall Risk Score:  `1  Depression screen [facility] 2/9     06/30/2023    3:56 PM 06/15/2023    1:16 PM 06/05/2023    4:11 PM 05/25/2023   10:00 AM 05/12/2023   11:15 AM 02/16/2023    9:07 AM 01/23/2023    1:06 PM  Depression screen PHQ 2/9  Decreased Interest 0 0 0 0 0 0 0  Down, Depressed, Hopeless 0 0 0 0 0 0 0  PHQ - 2 Score 0 0 0 0 0 0 0  Altered sleeping    0     Tired, decreased energy    0     Change in appetite    0     Feeling bad or failure about yourself     0     Trouble concentrating    0     Moving slowly or fidgety/restless    0     Suicidal thoughts    0     PHQ-9 Score     0     Difficult doing work/chores    Not difficult at all       Review of Systems  Musculoskeletal:  Positive for back pain.       Right lower leg pain  All other systems reviewed and are negative.     Objective:   Physical Exam        Assessment & Plan:

## 2023-07-05 ENCOUNTER — Ambulatory Visit (HOSPITAL_COMMUNITY)
Admission: RE | Admit: 2023-07-05 | Discharge: 2023-07-05 | Disposition: A | Discharge status: Home / Self Care | Payer: Medicare Other | Source: Ambulatory Visit | Attending: Radiology | Admitting: Radiology

## 2023-07-05 DIAGNOSIS — C541 Malignant neoplasm of endometrium: Secondary | ICD-10-CM | POA: Diagnosis not present

## 2023-07-05 DIAGNOSIS — K5730 Diverticulosis of large intestine without perforation or abscess without bleeding: Secondary | ICD-10-CM | POA: Diagnosis not present

## 2023-07-05 DIAGNOSIS — C799 Secondary malignant neoplasm of unspecified site: Secondary | ICD-10-CM | POA: Diagnosis not present

## 2023-07-05 MED ORDER — GADOBUTROL 1 MMOL/ML IV SOLN
8.0000 mL | Freq: Once | INTRAVENOUS | Status: AC | PRN
  Administered 2023-07-05: 8 mL via INTRAVENOUS

## 2023-07-06 ENCOUNTER — Encounter: Payer: Self-pay | Admitting: Registered Nurse

## 2023-07-07 DIAGNOSIS — I110 Hypertensive heart disease with heart failure: Secondary | ICD-10-CM | POA: Diagnosis not present

## 2023-07-07 DIAGNOSIS — E1142 Type 2 diabetes mellitus with diabetic polyneuropathy: Secondary | ICD-10-CM | POA: Diagnosis not present

## 2023-07-07 DIAGNOSIS — I5032 Chronic diastolic (congestive) heart failure: Secondary | ICD-10-CM | POA: Diagnosis not present

## 2023-07-07 DIAGNOSIS — J324 Chronic pansinusitis: Secondary | ICD-10-CM | POA: Diagnosis not present

## 2023-07-07 DIAGNOSIS — J45901 Unspecified asthma with (acute) exacerbation: Secondary | ICD-10-CM | POA: Diagnosis not present

## 2023-07-07 DIAGNOSIS — B974 Respiratory syncytial virus as the cause of diseases classified elsewhere: Secondary | ICD-10-CM | POA: Diagnosis not present

## 2023-07-07 LAB — DRUG TOX MONITOR 1 W/CONF, ORAL FLD
Amphetamines: NEGATIVE ng/mL (ref <10)
Barbiturates: NEGATIVE ng/mL (ref <10)
Benzodiazepines: NEGATIVE ng/mL (ref <0.50)
Buprenorphine: NEGATIVE ng/mL (ref <0.10)
Cocaine: NEGATIVE ng/mL (ref <5.0)
Codeine: NEGATIVE ng/mL (ref <2.5)
Dihydrocodeine: NEGATIVE ng/mL (ref <2.5)
Fentanyl: NEGATIVE ng/mL (ref <0.10)
Heroin Metabolite: NEGATIVE ng/mL (ref <1.0)
Hydrocodone: NEGATIVE ng/mL (ref <2.5)
Hydromorphone: NEGATIVE ng/mL (ref <2.5)
MARIJUANA: NEGATIVE ng/mL (ref <2.5)
MDMA: NEGATIVE ng/mL (ref <10)
Meprobamate: NEGATIVE ng/mL (ref <2.5)
Methadone: NEGATIVE ng/mL (ref <5.0)
Morphine: NEGATIVE ng/mL (ref <2.5)
Nicotine Metabolite: NEGATIVE ng/mL (ref <5.0)
Norhydrocodone: NEGATIVE ng/mL (ref <2.5)
Noroxycodone: 24.2 ng/mL — ABNORMAL HIGH (ref <2.5)
Opiates: POSITIVE ng/mL — AB (ref <2.5)
Oxycodone: 199.5 ng/mL — ABNORMAL HIGH (ref <2.5)
Oxymorphone: 3.4 ng/mL — ABNORMAL HIGH (ref <2.5)
Phencyclidine: NEGATIVE ng/mL (ref <10)
Tapentadol: NEGATIVE ng/mL (ref <5.0)
Tramadol: NEGATIVE ng/mL (ref <5.0)
Zolpidem: NEGATIVE ng/mL (ref <5.0)

## 2023-07-07 LAB — DRUG TOX ALC METAB W/CON, ORAL FLD: Alcohol Metabolite: NEGATIVE ng/mL (ref <25)

## 2023-07-08 ENCOUNTER — Other Ambulatory Visit: Payer: Self-pay | Admitting: *Deleted

## 2023-07-08 DIAGNOSIS — J45901 Unspecified asthma with (acute) exacerbation: Secondary | ICD-10-CM | POA: Diagnosis not present

## 2023-07-08 DIAGNOSIS — I5032 Chronic diastolic (congestive) heart failure: Secondary | ICD-10-CM | POA: Diagnosis not present

## 2023-07-08 DIAGNOSIS — J324 Chronic pansinusitis: Secondary | ICD-10-CM | POA: Diagnosis not present

## 2023-07-08 DIAGNOSIS — I110 Hypertensive heart disease with heart failure: Secondary | ICD-10-CM | POA: Diagnosis not present

## 2023-07-08 DIAGNOSIS — E1142 Type 2 diabetes mellitus with diabetic polyneuropathy: Secondary | ICD-10-CM | POA: Diagnosis not present

## 2023-07-08 DIAGNOSIS — B974 Respiratory syncytial virus as the cause of diseases classified elsewhere: Secondary | ICD-10-CM | POA: Diagnosis not present

## 2023-07-08 NOTE — Patient Outreach (Signed)
 Care Management  Transitions of Care Program Transitions of Care Post-discharge week 4   07/08/2023 Name: Amber Stephenson MRN: [id number] DOB: 12/09/1945  Subjective: Amber Stephenson is a 78 y.o. year old female who is a primary care patient of Stacks, Butler, MD. The Care Management team Engaged with patient Engaged with patient by telephone to assess and address transitions of care needs.   Consent to Services:  Patient was given information about care management services, agreed to services, and gave verbal consent to participate.   Assessment: Pt reports feels she is getting stronger, continues to use oxygen  as prescribed, no new concerns reported, pt requests to cancel care management appointment for 2/11 with Luke Griffiths, RN care manager notified to cancel.         SDOH Interventions    Flowsheet Row Telephone from 06/16/2023 in  POPULATION HEALTH DEPARTMENT Telephone from 02/03/2023 in [facility] Community Care Coordination Clinical Support from 01/23/2023 in [facility] Telephone from 01/22/2023 in [facility] Care Coordination Telephone from 11/13/2022 in [facility] Telephone from 09/05/2022 in [facility]  SDOH Interventions        Food Insecurity Interventions Intervention Not Indicated Intervention Not Indicated Intervention Not Indicated Intervention Not Indicated Intervention Not Indicated Intervention Not Indicated  Housing Interventions Intervention Not Indicated -- Intervention Not Indicated -- Intervention Not Indicated Intervention Not Indicated  Transportation Interventions Intervention Not Indicated Intervention Not Indicated Intervention Not Indicated Intervention Not Indicated Intervention Not Indicated, Patient Resources Dietitian) --  Utilities Interventions Intervention Not Indicated Intervention Not  Indicated Intervention Not Indicated -- -- --  Alcohol  Usage Interventions -- -- Intervention Not Indicated (Score <7) -- -- --  Financial Strain Interventions -- -- Intervention Not Indicated -- -- --  Stress Interventions -- -- Intervention Not Indicated -- -- --  Social Connections Interventions -- -- Intervention Not Indicated -- -- --  Health Literacy Interventions -- -- Intervention Not Indicated -- -- --        Goals Addressed             This Visit's Progress    Transition of Care/ pt will have no readmissions within 30 days       Current Barriers:  Home Health services [facility] continue to work with pt. Knowledge Deficits related to plan of care for management of RSV- pt is eating and drinking well. Patient reports she continues to get stronger daily, went to ED on 06/26/23 for chest pain and states  it was related to the pneumonia but is better now , denies any chest pain since last conversation, pt reports she has finished prednisone  and all antibiotics.  Patient reports that she is using 3 liters of oxygen  at night and as needed during the day.   RNCM Clinical Goal(s):  Patient will work with the Care Management team over the next 30 days to address Transition of Care Barriers: Home Health services verbalize understanding of plan for management of RSV as evidenced by pt report, review of EMR and  through collaboration with RN Care manager, provider, and care team.   Interventions: Evaluation of current treatment plan related to  self management and patient's adherence to plan as established by provider RSV  (Status:  Goal on track:  Yes.)  Short Term Goal Evaluation of current treatment plan related to  RSV ,  home health has not seen patient,   self-management  and patient's adherence to plan as established by provider. Discussed plans with patient for ongoing care management follow up and provided patient with direct contact information for care management  team Reviewed all upcoming scheduled appointments including Dr. Shannon on 07/09/23 Reviewed with patient importance of healthy diet. Reviewed with patient to continue to take her medications as prescribed and call MD for additional concerns. Reinforced safety precautions, importance of using cane In basket sent to Luke Griffiths RN and ask her to cancel appointment scheduled with pt for 2/11, pt wants to complete the 30 day TOC program, then will decide if she wants further care mangement  Patient Goals/Self-Care Activities: Participate in Transition of Care Program/Attend [facility] scheduled calls Notify RN Care Manager of TOC call rescheduling needs Take all medications as prescribed Attend all scheduled provider appointments Call pharmacy for medication refills 3-7 days in advance of running out of medications Perform all self care activities independently  Call provider office for new concerns or questions  Please contact your doctor for any unresolved/ worsening symptoms- cough, wheezing, fever Call 911 for shortness of breath, chest pain Use oxygen  as prescribed Please use your cane, keep pathways clear, do not get in a hurry and ask for assistance as needed Follow up with Dr. Shannon on 07/09/23 @ 1045 am  Follow Up Plan:  Telephone follow up appointment with care management team member scheduled for:  07/15/23 @ 10: 15 am          Plan: Telephone follow up appointment with care management team member scheduled for: 07/15/23 @ 1015 am  Mliss Creed [facility], BSN RN Care Manager/ Transition of Care [facility] Population Health [phone]

## 2023-07-08 NOTE — Progress Notes (Addendum)
 Radiation Oncology         [phone] ________________________________  Name: Amber Stephenson MRN: [id number]  Date: 07/09/2023  DOB: 02-Sep-1945  Follow-Up Visit Note  CC: [name], MD  [name], MD    ICD-10-CM   1. Dysuria  R30.0 Urinalysis, Routine w reflex microscopic    Urine culture    CANCELED: Urinalysis, Routine w reflex microscopic -Urine, Clean Catch    CANCELED: Urine culture    CANCELED: Urinalysis, Routine w reflex microscopic -Urine, Clean Catch    2. Endometrial cancer (HCC)  C54.1       Diagnosis:   The encounter diagnosis was Endometrial cancer (HCC) [C54.1].   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 (patient is not a surgical candidate due to comorbidities)    Cancer Staging  Endometrial cancer [facility]) Staging form: Corpus Uteri - Carcinoma and Carcinosarcoma, AJCC 8th Edition - Clinical stage from 01/26/2023: FIGO Stage IB (cT1b, cN0, cM0) - Signed by Eldonna Mays, MD on 02/03/2023  Indication for treatment: Curative      Interval Since Last Radiation:  3 month 9 days   Site/Dose/Technique/Mode:  First Treatment Date: 2023-02-18 - Last Treatment Date: 2023-03-31   Site: Uterus Technique: IMRT Mode: Photon Dose Per Fraction: 1.8 Gy Prescribed Dose (Delivered / Prescribed): 45 Gy / 45 Gy Prescribed Fxs (Delivered / Prescribed): 25 / 25   Site: Uterus - boost treatment  Technique: IMRT Mode: Photon Dose Per Fraction: 2 Gy Prescribed Dose (Delivered / Prescribed): 10 Gy / 10 Gy Prescribed Fxs (Delivered / Prescribed): 5 / 5   Narrative:  The patient returns today for routine follow-up and to review most recent MRI results. She was last seen in office on 05-04-23 for a routine follow up.   Since then, she has presented to the hospital for subsequent pulmonary infections and had to be hospitalized. She notes residual shortness of breath, but is feeling much better after being discharged.    She also sustained a fall last week that has resulted in worsening left lower extremity pain and bruising. She is seeing her PCP for this issue tomorrow.   MRI of the pelvis on 07/05/23 demonstrated no residual endometrial mass or other abnormality of the uterus. No evidence of lymphadenopathy or metastatic disease in the pelvis was appreciated.   Today, the patient notes some back pain, consistent with her baseline from previous falls as well as constipation which she attributes to constipation. She also notes dysuria and an increase in urinary frequency. She denies any odor to her urine. She does not use her dilators regularly, it is very uncomfortable for her. 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   Allergies:  is allergic to iodine, ivp dye [iodinated contrast media], latex, tape, and tizanidine .  Meds: Current Outpatient Medications  Medication Sig Dispense Refill   albuterol  (VENTOLIN  HFA) 108 (90 Base) MCG/ACT inhaler Inhale 2 puffs into the lungs every 4 (four) hours as needed for wheezing or shortness of breath. 1 each 11   allopurinol  (ZYLOPRIM ) 100 MG tablet Take 100 mg by mouth daily.     apixaban  (ELIQUIS ) 5 MG TABS tablet Take 1 tablet (5 mg total) by mouth 2 (two) times daily. 180 tablet 3   ARIPiprazole  (ABILIFY ) 2 MG tablet Take 2 mg by mouth at bedtime.     benzonatate  (TESSALON ) 100 MG capsule TAKE 2 CAPSULES(200 MG) BY MOUTH THREE TIMES DAILY AS NEEDED  FOR COUGH 30 capsule 0   clotrimazole  (MYCELEX ) 10 MG troche Take 10 mg by mouth in the morning and at bedtime.     cyanocobalamin  (VITAMIN B12) 1000 MCG tablet Take 1 tablet (1,000 mcg total) by mouth daily. 30 tablet 1   denosumab  (PROLIA ) 60 MG/ML SOSY injection Inject 60 mg into the skin every 6 (six) months. 1 mL 1   doxepin  (SINEQUAN ) 10 MG capsule Take 10 mg by mouth at bedtime.     escitalopram  (LEXAPRO ) 10 MG tablet Take 10 mg by mouth daily.      fluticasone -salmeterol (ADVAIR DISKUS) 100-50 MCG/ACT AEPB Inhale 1 puff into the lungs 2 (two) times daily. (Patient taking differently: Inhale 1 puff into the lungs 2 (two) times daily as needed (for flares).) 1 each 5   Galcanezumab -gnlm (EMGALITY ) 120 MG/ML SOAJ INJECT CONTENTS ON 1 PEN(120MG ) INTO THE SKIN ONCE MONTHLY (Patient taking differently: Inject 120 mg into the skin every 30 (thirty) days.) 1 mL 3   insulin  glargine (LANTUS  SOLOSTAR) 100 UNIT/ML Solostar Pen Inject 32 Units into the skin 2 (two) times daily. 60 mL 4   insulin  lispro (HUMALOG  KWIKPEN) 100 UNIT/ML KwikPen Max daily 45 units (Patient taking differently: Inject 6-45 Units into the skin 3 (three) times daily. Max daily 45 units) 45 mL 6   Insulin  Pen Needle 29G X MISC 1 Device by Does not apply route daily in the afternoon. 400 each 3   levocetirizine (XYZAL  ALLERGY 24HR) 5 MG tablet Take 1 tablet (5 mg total) by mouth every evening. For itch     lidocaine  (XYLOCAINE ) 2 % solution Use as directed 15 mLs in the mouth or throat every 4 (four) hours as needed for mouth pain. 100 mL 5   LORazepam  (ATIVAN ) 0.5 MG tablet Take 0.5-1 mg by mouth See admin instructions. Take 0.5 mg by mouth in the morning and 1 mg at bedtime     losartan  (COZAAR ) 25 MG tablet Take 25 mg by mouth daily.     meclizine  (ANTIVERT ) 25 MG tablet Take 1 tablet (25 mg total) by mouth 3 (three) times daily as needed for dizziness. 60 tablet 1   metFORMIN  (GLUCOPHAGE -XR) 500 MG 24 hr tablet Take 2 tablets (1,000 mg total) by mouth daily with breakfast. 180 tablet 3   methocarbamol  1000 MG TABS Take 1,000 mg by mouth every 6 (six) hours as needed for muscle spasms. 120 tablet 5   metoprolol  tartrate (LOPRESSOR ) 50 MG tablet Take 1 tablet (50 mg total) by mouth 2 (two) times daily. 180 tablet 2   naloxone  (NARCAN ) nasal spray 4 mg/0.1 mL Place 1 spray into the nose once as needed (AS DIRECTED).     nystatin  (MYCOSTATIN /NYSTOP ) powder Apply 1 Application  topically as needed (rash). 15 g 2   nystatin  ointment (MYCOSTATIN ) Apply 1 Application topically 2 (two) times daily as needed (for rashes). 30 g 1   Oxycodone  HCl 20 MG TABS Take 1 tablet (20 mg total) by mouth 2 (two) times daily as needed. Do Not Fill Before 08/09/2023 60 tablet 0   pregabalin  (LYRICA ) 200 MG capsule Take 200 mg by mouth 2 (two) times daily.     promethazine  (PHENERGAN ) 25 MG tablet Take 0.5-1 tablets (12.5-25 mg total) by mouth every 6 (six) hours as needed for refractory nausea / vomiting (cancer related nausea/vomiting). 120 tablet 5   promethazine -dextromethorphan  (PROMETHAZINE -DM) 6.25-15 MG/5ML syrup Take 5 mLs by mouth 4 (four) times daily as needed for cough. 180 mL 0  rOPINIRole  (REQUIP ) 1 MG tablet Take 1 tablet (1 mg total) by mouth at bedtime. For leg cramps 30 tablet 5   tirzepatide  (MOUNJARO ) 10 MG/0.5ML Pen Inject 10 mg into the skin once a week. 6 mL 3   Vitamin D , Cholecalciferol , 25 MCG (1000 UT) TABS Take 1,000 Units by mouth daily in the afternoon.     amoxicillin -clavulanate (AUGMENTIN ) 875-125 MG tablet Take 1 tablet by mouth every 12 (twelve) hours. (Patient not taking: Reported on 07/09/2023) 14 tablet 0   atorvastatin  (LIPITOR) 80 MG tablet Take 1 tablet (80 mg total) by mouth daily. 90 tablet 3   azithromycin  (ZITHROMAX  Z-PAK) 250 MG tablet Take 1 tablet (250 mg total) by mouth daily. Take 2 tablets the first day followed by 1 tablet daily. (Patient not taking: Reported on 07/09/2023) 6 tablet 0   fluconazole  (DIFLUCAN ) 100 MG tablet Take two with first dose. Then starting the next day take one daily until all are taken. (Patient not taking: Reported on 07/09/2023) 15 tablet 0   Insulin  Disposable Pump (OMNIPOD 5 G7 INTRO, GEN 5,) KIT 1 Device by Does not apply route every other day. (Patient not taking: Reported on 07/09/2023) 1 kit 0   Insulin  Disposable Pump (OMNIPOD 5 G7 PODS, GEN 5,) MISC 1 Device by Does not apply route every other day. (Patient not taking:  Reported on 07/09/2023) 45 each 3   omeprazole  (PRILOSEC) 40 MG capsule Take 40 mg by mouth daily. (Patient not taking: Reported on 07/09/2023)     ondansetron  (ZOFRAN ) 4 MG tablet Take 1 tablet (4 mg total) by mouth every 8 (eight) hours as needed for nausea or vomiting. For cancer related nausea (Patient not taking: Reported on 07/09/2023) 90 tablet 5   OXcarbazepine  (TRILEPTAL ) 150 MG tablet Take 1 tablet (150 mg total) by mouth 2 (two) times daily. 60 tablet 0   pantoprazole  (PROTONIX ) 40 MG tablet Take 40 mg by mouth daily as needed (for reflux). (Patient not taking: Reported on 07/09/2023)     potassium chloride  (KLOR-CON ) 10 MEQ tablet Take 1 tablet (10 mEq total) by mouth daily. 90 tablet 3   predniSONE  (DELTASONE ) 20 MG tablet Take 1 tablet (20 mg total) by mouth 2 (two) times daily with a meal. (Patient not taking: Reported on 07/09/2023) 10 tablet 0   senna-docusate (SENOKOT-S) 8.6-50 MG tablet Take 1 tablet by mouth at bedtime. (Patient not taking: Reported on 07/09/2023) 30 tablet 0   spironolactone  (ALDACTONE ) 25 MG tablet Take 0.5 tablets (12.5 mg total) by mouth daily. 15 tablet 2   TYLENOL  500 MG tablet Take 500-1,000 mg by mouth every 6 (six) hours as needed for mild pain (pain score 1-3) or headache. (Patient not taking: Reported on 07/09/2023)     Current Facility-Administered Medications  Medication Dose Route Frequency Provider Last Rate Last Admin   denosumab  (PROLIA ) injection 60 mg  60 mg Subcutaneous Once Stacks, Warren, MD       Facility-Administered Medications Ordered in Other Encounters  Medication Dose Route Frequency Provider Last Rate Last Admin   bupivacaine -epinephrine  (MARCAINE  W/ EPI) 0.5% -1:200000 injection    Anesthesia Intra-op Hollis, Kevin D, MD   12 mL at 01/21/19 0935    Physical Findings: The patient is in no acute distress. Patient is alert and oriented.  height is 5' 2 (1.575 m) and weight is 187 lb (84.8 kg). Her temperature is 97.8 F (36.6 C). Her blood  pressure is 152/97 (abnormal) and her pulse is 71. Her respiration is  24 (abnormal) and oxygen  saturation is 97%. .  No significant changes. Lungs are clear to auscultation bilaterally. Heart has regular rate and rhythm. No palpable cervical, supraclavicular, or axillary adenopathy. Abdomen soft, non-tender, normal bowel sounds. Left upper thigh is significant for surgical scar in the mid thigh.  Superior to this is a soft tissues swelling consistent with lipoma. Patient is in a wheelchair today on 3L of oxygen .  She has bruising in her right lower extremity from recent fall for which she will see her primary care physician tomorrow to further address. Pelvic exam deferred due to patient preference and good results on MRI.   Lab Findings: Lab Results  Component Value Date   WBC 6.3 06/26/2023   HGB 10.3 (L) 06/26/2023   HCT 32.6 (L) 06/26/2023   MCV 99.4 06/26/2023   PLT 171 06/26/2023    Radiographic Findings: MR Pelvis W Wo Contrast Result Date: 07/08/2023 CLINICAL DATA:  Endometrial cancer staging, metastatic disease evaluation EXAM: MRI PELVIS WITHOUT AND WITH CONTRAST TECHNIQUE: Multiplanar multisequence MR imaging of the pelvis was performed both before and after administration of intravenous contrast. CONTRAST:  8mL GADAVIST  GADOBUTROL  1 MMOL/ML IV SOLN COMPARISON:  01/26/2023 FINDINGS: Urinary Tract:  No abnormality visualized. Bowel:  Sigmoid diverticulosis. Vascular/Lymphatic: No pathologically enlarged lymph nodes. No significant vascular abnormality seen. Reproductive: No residual endometrial mass or other abnormality of the uterus (series 6, image 19). Normal postmenopausal appearance of the uterus without significant endometrial stripe thickness. Normal postmenopausal ovaries. Other:  None. Musculoskeletal: No suspicious bone lesions identified. IMPRESSION: 1. No residual endometrial mass or other abnormality of the uterus. 2. No evidence of lymphadenopathy or metastatic disease in the  pelvis. 3. Sigmoid diverticulosis. Electronically Signed   By: Marolyn JONETTA Jaksch M.D.   On: 07/08/2023 09:27   DG Toe 5th Right Result Date: 07/01/2023 CLINICAL DATA:  Toe pain, hit door. EXAM: RIGHT FIFTH TOE COMPARISON:  None Available. FINDINGS: There is no evidence of fracture or dislocation. No erosive change. The adjacent digits are intact. Postsurgical change in the distal second metatarsal, suspect prior bunionectomy of the first metatarsal. Soft tissues are unremarkable. IMPRESSION: No fracture or subluxation of the right toes. Electronically Signed   By: Andrea Gasman M.D.   On: 07/01/2023 10:59   CT Angio Chest/Abd/Pel for Dissection W and/or W/WO Result Date: 06/26/2023 CLINICAL DATA:  Severe chest pain EXAM: CT ANGIOGRAPHY CHEST, ABDOMEN AND PELVIS TECHNIQUE: Non-contrast CT of the chest was initially obtained. Multidetector CT imaging through the chest, abdomen and pelvis was performed using the standard protocol during bolus administration of intravenous contrast. Multiplanar reconstructed images and MIPs were obtained and reviewed to evaluate the vascular anatomy. RADIATION DOSE REDUCTION: This exam was performed according to the departmental dose-optimization program which includes automated exposure control, adjustment of the mA and/or kV according to patient size and/or use of iterative reconstruction technique. CONTRAST:  OMNIPAQUE  IOHEXOL  350 MG/ML SOLN COMPARISON:  CT 06/26/2023, 06/10/2023 FINDINGS: CTA CHEST FINDINGS Cardiovascular: Non contrasted images of the chest demonstrate no acute intramural hematoma. Negative for aneurysm or dissection. Mild atherosclerosis. Cardiomegaly. No significant pericardial effusion. Mitral annular calcification. Mediastinum/Nodes: Patent trachea. No thyroid  mass. Multiple subcentimeter mediastinal lymph nodes. Esophagus within normal limits Lungs/Pleura: Minimal peribronchovascular nodularity in the right upper lobe. No consolidative process,  pleural effusion or pneumothorax Musculoskeletal: No acute or suspicious osseous abnormality. Mild chronic superior endplate deformity T11. Review of the MIP images confirms the above findings. CTA ABDOMEN AND PELVIS FINDINGS VASCULAR Aorta: Normal caliber aorta without  aneurysm, dissection, vasculitis or significant stenosis. Celiac: Patent without evidence of aneurysm, dissection, vasculitis or significant stenosis. Origin calcification SMA: Patent without evidence of aneurysm, dissection, vasculitis or significant stenosis. Renals: Both renal arteries are patent without evidence of aneurysm, dissection, vasculitis, fibromuscular dysplasia or significant stenosis. IMA: Patent without evidence of aneurysm, dissection, vasculitis or significant stenosis. Inflow: Patent without evidence of aneurysm, dissection, vasculitis or significant stenosis. Veins: Suboptimally evaluated Review of the MIP images confirms the above findings. NON-VASCULAR Hepatobiliary: No focal liver abnormality is seen. No gallstones, gallbladder wall thickening, or biliary dilatation. Pancreas: Unremarkable. No pancreatic ductal dilatation or surrounding inflammatory changes. Spleen: Normal in size without focal abnormality. Adrenals/Urinary Tract: Adrenal glands are unremarkable. Kidneys are normal, without renal calculi, focal lesion, or hydronephrosis. Bladder is unremarkable. Stomach/Bowel: Stomach nonenlarged. No dilated small bowel. No acute bowel wall thickening. Lymphatic: No suspicious lymph nodes Reproductive: Uterus and bilateral adnexa are unremarkable. Other: Negative for pelvic effusion or free air Musculoskeletal: Mild chronic compression deformities at T9, T11, T12 and L2. 1 Review of the MIP images confirms the above findings. IMPRESSION: 1. Negative for aortic dissection or aneurysm. 2. Cardiomegaly. 3. Minimal peribronchovascular nodularity in the right upper lobe, suggesting infectious or inflammatory bronchiolitis 4. No CT  evidence for acute intra-abdominal or pelvic abnormality. Electronically Signed   By: Luke Bun M.D.   On: 06/26/2023 22:09   CT Chest Wo Contrast Result Date: 06/26/2023 CLINICAL DATA:  Aortic aneurysm suspected. Severe chest pain with enlarged cardiac silhouette. Contrast allergy. Shortness of breath. EXAM: CT CHEST WITHOUT CONTRAST TECHNIQUE: Multidetector CT imaging of the chest was performed following the standard protocol without IV contrast. RADIATION DOSE REDUCTION: This exam was performed according to the departmental dose-optimization program which includes automated exposure control, adjustment of the mA and/or kV according to patient size and/or use of iterative reconstruction technique. COMPARISON:  Chest radiograph 06/26/2023.  CT chest 06/10/2023 FINDINGS: Cardiovascular: Normal caliber thoracic aorta with maximal diameter of the ascending aorta measured at 3.7 cm. No change since prior study. Cardiac enlargement. No pericardial effusion. Calcification in the mitral valve annulus, aorta, and coronary arteries. Mediastinum/Nodes: Scattered mediastinal lymph nodes are not pathologically enlarged, likely reactive and unchanged. Esophagus is decompressed. Thyroid  gland is unremarkable. Lungs/Pleura: Areas of atelectasis or scarring in the lung bases. Fine nodular peribronchial infiltrates in the right upper lung most likely representing early bronchopneumonia. Mild subpleural fibrosis. No pleural effusion or pneumothorax. Upper Abdomen: No acute abnormalities. Musculoskeletal: Degenerative changes in the spine. Postoperative right shoulder arthroplasty. IMPRESSION: 1. Normal caliber of the thoracic aorta.  No aneurysm identified. 2. Cardiac enlargement. 3. Fine peribronchial nodularity in the right upper lung likely representing early bronchopneumonia. 4. Slight fibrosis or atelectasis in the lung bases. 5. Aortic atherosclerosis. Electronically Signed   By: Elsie Gravely M.D.   On: 06/26/2023  17:53   DG Chest 2 View Result Date: 06/26/2023 CLINICAL DATA:  Chest pain EXAM: CHEST - 2 VIEW COMPARISON:  X-ray 06/10/2023 and CTA FINDINGS: Enlarged cardiopericardial silhouette. Chronic interstitial lung changes. No pneumothorax, effusion or edema. No consolidation. Curvature and degenerative changes along the spine. Right shoulder reverse arthroplasty. IMPRESSION: Enlarged cardiopericardial silhouette.  Chronic changes. Electronically Signed   By: Ranell Bring M.D.   On: 06/26/2023 17:00   CT HEAD WO CONTRAST ( ) Result Date: 06/11/2023 CLINICAL DATA:  [name] and hit head 2 days ago, also complains of right lower quadrant abdominal pain and diarrhea. CTA chest was performed earlier today. EXAM: CT HEAD WITHOUT CONTRAST CT ABDOMEN  AND PELVIS WITHOUT CONTRAST TECHNIQUE: Contiguous axial images were obtained from the base of the skull through the vertex without intravenous contrast. Multiplanar CT image reconstructions were also generated. Multidetector CT imaging of the abdomen and pelvis was performed following the standard protocol without IV contrast. RADIATION DOSE REDUCTION: This exam was performed according to the departmental dose-optimization program which includes automated exposure control, adjustment of the mA and/or kV according to patient size and/or use of iterative reconstruction technique. COMPARISON:  CTA chest today, head CT 05/14/2023, CT chest, abdomen and pelvis without contrast 05/14/2023, CT abdomen and pelvis without contrast 01/13/2023. FINDINGS: CT HEAD FINDINGS Brain: No evidence of acute infarction, hemorrhage, hydrocephalus, extra-axial collection or mass lesion/mass effect. There is mild global atrophy, mild small-vessel disease of the cerebral white matter. No midline shift. Basal cisterns are clear. Vascular: There are scattered calcific plaques in the carotid siphons. No hyperdense central vessel is seen. Skull: Negative for fractures or focal lesions. No visible scalp  hematoma. Hyperostosis frontalis interna. Sinuses/Orbits: There is interval new finding of patchy opacification of the ethmoid sinus air cells, increased membrane thickening in the sphenoid and maxillary sinuses, increased membrane disease in the right frontal sinus, and a short fluid level in the left maxillary cavity. The mastoid air cells and middle ears are clear. Old lens replacements are again noted with otherwise negative orbits. Midline nasal septum. Other: None. CT ABDOMEN AND PELVIS FINDINGS Lower chest: Mild cardiomegaly. No pericardial effusion. The inferior mitral ring is heavily calcified. Lung bases show mild posterior atelectasis without infiltrates. Hepatobiliary: No focal liver abnormality is seen without contrast. The gallbladder and bile ducts are unremarkable. Pancreas: Partially atrophic.  Otherwise unremarkable contrast The greatest fatty infiltration is in the head and uncinate process. Spleen: No abnormality. Adrenals/Urinary Tract: Stable 1 cm left adrenal nodule including dating back to a scan from 12/13/2020, assumed benign at this point. No right adrenal lesion is seen. There is a 9 mm Bosniak 1 cyst of the lateral upper left kidney, Hounsfield density is 4.3. No follow-up imaging is recommended. No other focal abnormality of the unenhanced kidneys is seen. No collecting system stones, ureteral stones or hydronephrosis. Faint contrast from today's CTA is seen in the ureters, with contrast in the bladder. There is no bladder thickening. Stomach/Bowel: No dilatation or wall thickening. Normal appendix. Moderate fecal stasis. Sigmoid diverticulosis without evidence of diverticulitis. Vascular/Lymphatic: Aortic atherosclerosis. No enlarged abdominal or pelvic lymph nodes. Multiple pelvic phleboliths. Reproductive: Uterus and bilateral adnexa are unremarkable. Other: Moderate-sized right inguinal fat hernia is noted chronically. There are small umbilical and left inguinal fat hernias. There  is no incarcerated hernia. There is no free fluid, free air, or free hemorrhage, no focal inflammatory process. Musculoskeletal: There is osteopenia. There is mild chronic anterior wedge deformity of the T9, T11, T12, and L2 vertebral bodies. Stable degenerative changes. No destructive bone lesions. IMPRESSION: 1. No acute intracranial CT findings or depressed skull fractures. 2. Atrophy and small-vessel disease. 3. Interval new finding of pansinusitis. 4. No acute noncontrast CT findings in the abdomen or pelvis. 5. Constipation and diverticulosis. 6. Aortic and mitral ring atherosclerosis. 7. Stable 1 cm left adrenal nodule, assumed benign at this point. 8. Osteopenia and degenerative change. 9. Chronic mild anterior wedge deformities of the T9, T11, T12, and L2 vertebral bodies. 10. Umbilical and inguinal fat hernias. Aortic Atherosclerosis (ICD10-I70.0). Electronically Signed   By: Francis Quam M.D.   On: 06/11/2023 00:25   CT ABDOMEN PELVIS WO CONTRAST Result Date: 06/11/2023  CLINICAL DATA:  [name] and hit head 2 days ago, also complains of right lower quadrant abdominal pain and diarrhea. CTA chest was performed earlier today. EXAM: CT HEAD WITHOUT CONTRAST CT ABDOMEN AND PELVIS WITHOUT CONTRAST TECHNIQUE: Contiguous axial images were obtained from the base of the skull through the vertex without intravenous contrast. Multiplanar CT image reconstructions were also generated. Multidetector CT imaging of the abdomen and pelvis was performed following the standard protocol without IV contrast. RADIATION DOSE REDUCTION: This exam was performed according to the departmental dose-optimization program which includes automated exposure control, adjustment of the mA and/or kV according to patient size and/or use of iterative reconstruction technique. COMPARISON:  CTA chest today, head CT 05/14/2023, CT chest, abdomen and pelvis without contrast 05/14/2023, CT abdomen and pelvis without contrast 01/13/2023. FINDINGS:  CT HEAD FINDINGS Brain: No evidence of acute infarction, hemorrhage, hydrocephalus, extra-axial collection or mass lesion/mass effect. There is mild global atrophy, mild small-vessel disease of the cerebral white matter. No midline shift. Basal cisterns are clear. Vascular: There are scattered calcific plaques in the carotid siphons. No hyperdense central vessel is seen. Skull: Negative for fractures or focal lesions. No visible scalp hematoma. Hyperostosis frontalis interna. Sinuses/Orbits: There is interval new finding of patchy opacification of the ethmoid sinus air cells, increased membrane thickening in the sphenoid and maxillary sinuses, increased membrane disease in the right frontal sinus, and a short fluid level in the left maxillary cavity. The mastoid air cells and middle ears are clear. Old lens replacements are again noted with otherwise negative orbits. Midline nasal septum. Other: None. CT ABDOMEN AND PELVIS FINDINGS Lower chest: Mild cardiomegaly. No pericardial effusion. The inferior mitral ring is heavily calcified. Lung bases show mild posterior atelectasis without infiltrates. Hepatobiliary: No focal liver abnormality is seen without contrast. The gallbladder and bile ducts are unremarkable. Pancreas: Partially atrophic.  Otherwise unremarkable contrast The greatest fatty infiltration is in the head and uncinate process. Spleen: No abnormality. Adrenals/Urinary Tract: Stable 1 cm left adrenal nodule including dating back to a scan from 12/13/2020, assumed benign at this point. No right adrenal lesion is seen. There is a 9 mm Bosniak 1 cyst of the lateral upper left kidney, Hounsfield density is 4.3. No follow-up imaging is recommended. No other focal abnormality of the unenhanced kidneys is seen. No collecting system stones, ureteral stones or hydronephrosis. Faint contrast from today's CTA is seen in the ureters, with contrast in the bladder. There is no bladder thickening. Stomach/Bowel: No  dilatation or wall thickening. Normal appendix. Moderate fecal stasis. Sigmoid diverticulosis without evidence of diverticulitis. Vascular/Lymphatic: Aortic atherosclerosis. No enlarged abdominal or pelvic lymph nodes. Multiple pelvic phleboliths. Reproductive: Uterus and bilateral adnexa are unremarkable. Other: Moderate-sized right inguinal fat hernia is noted chronically. There are small umbilical and left inguinal fat hernias. There is no incarcerated hernia. There is no free fluid, free air, or free hemorrhage, no focal inflammatory process. Musculoskeletal: There is osteopenia. There is mild chronic anterior wedge deformity of the T9, T11, T12, and L2 vertebral bodies. Stable degenerative changes. No destructive bone lesions. IMPRESSION: 1. No acute intracranial CT findings or depressed skull fractures. 2. Atrophy and small-vessel disease. 3. Interval new finding of pansinusitis. 4. No acute noncontrast CT findings in the abdomen or pelvis. 5. Constipation and diverticulosis. 6. Aortic and mitral ring atherosclerosis. 7. Stable 1 cm left adrenal nodule, assumed benign at this point. 8. Osteopenia and degenerative change. 9. Chronic mild anterior wedge deformities of the T9, T11, T12, and L2 vertebral bodies.  10. Umbilical and inguinal fat hernias. Aortic Atherosclerosis (ICD10-I70.0). Electronically Signed   By: Francis Quam M.D.   On: 06/11/2023 00:25   CT Angio Chest PE W/Cm &/Or Wo Cm Result Date: 06/10/2023 CLINICAL DATA:  Shortness of breath, productive cough. EXAM: CT ANGIOGRAPHY CHEST WITH CONTRAST TECHNIQUE: Multidetector CT imaging of the chest was performed using the standard protocol during bolus administration of intravenous contrast. Multiplanar CT image reconstructions and MIPs were obtained to evaluate the vascular anatomy. RADIATION DOSE REDUCTION: This exam was performed according to the departmental dose-optimization program which includes automated exposure control, adjustment of the mA  and/or kV according to patient size and/or use of iterative reconstruction technique. CONTRAST:  75mL OMNIPAQUE  IOHEXOL  350 MG/ML SOLN COMPARISON:  May 14, 2023. FINDINGS: Cardiovascular: Satisfactory opacification of the pulmonary arteries to the segmental level. No evidence of pulmonary embolism. Mild cardiomegaly. No pericardial effusion. Mediastinum/Nodes: No enlarged mediastinal, hilar, or axillary lymph nodes. Thyroid  gland, trachea, and esophagus demonstrate no significant findings. Lungs/Pleura: No pneumothorax or pleural effusion is noted. Minimal bibasilar subsegmental atelectasis is noted. Upper Abdomen: No acute abnormality. Musculoskeletal: Status post right shoulder arthroplasty. No acute osseous abnormality is noted. Review of the MIP images confirms the above findings. IMPRESSION: No definite evidence of pulmonary embolus. Minimal bibasilar subsegmental atelectasis. Electronically Signed   By: Lynwood Landy Raddle M.D.   On: 06/10/2023 15:33   DG Chest 2 View Result Date: 06/10/2023 CLINICAL DATA:  Shortness of breath. EXAM: CHEST - 2 VIEW COMPARISON:  06/03/2023. FINDINGS: Low lung volume. Redemonstration of increased interstitial markings throughout bilateral lungs, slightly worsened, which may be accentuated by low lung volume. This is nonspecific and may represent underlying atypical pneumonia versus pulmonary edema. Apparent right superior hilar opacity described on the prior exam is unchanged. Bilateral lung fields are otherwise clear. Bilateral costophrenic angles are clear. Note is made of elevated right hemidiaphragm. Stable cardio-mediastinal silhouette. No acute osseous abnormalities. Right reverse shoulder arthroplasty again seen. The soft tissues are within normal limits. IMPRESSION: *Slight interval increase in the increased interstitial markings throughout bilateral lungs, likely accentuated by low lung volume. This is nonspecific and may represent underlying atypical pneumonia  versus pulmonary edema. *Otherwise no acute consolidation or lung collapse. Electronically Signed   By: Ree Molt M.D.   On: 06/10/2023 10:02    Impression:  The encounter diagnosis was Endometrial cancer (HCC) [C54.1].   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 However, the results of her MRI from Sunday are very reassuring. We reviewed the results with the patient and her husband who were very pleased to hear that there is no evidence of disease recurrence at this time.   Patient presents with symptoms concerning for a UTI today.   Plan:  Routine office follow-up in 6 months. Encouraged patient to call with any questions or concerns in the meantime.   UA ordered to rule out infection. Will call the patient and treat accordingly if results indicate infections.    30 minutes of total time was spent for this patient encounter, including preparation, face-to-face counseling with the patient and coordination of care, physical exam, and documentation of the encounter. ____________________________________   Leeroy Due, PA-C   Lynwood CHARM Nasuti, PhD, MD   [facility]  Radiation Oncology Direct Dial : [phone]  Fax: [phone] [email or web address]    This document serves as  a record of services personally performed by Lynwood Nasuti, MD. It was created on his behalf by Reymundo Cartwright, a trained medical scribe. The creation of this record is based on the scribe's personal observations and the provider's statements to them. This document has been checked and approved by the attending provider.

## 2023-07-08 NOTE — Patient Instructions (Signed)
 Visit Information  Thank you for taking time to visit with me today. Please don't hesitate to contact me if I can be of assistance to you before our next scheduled telephone appointment.  Following are the goals we discussed today:   Goals Addressed             This Visit's Progress    Transition of Care/ pt will have no readmissions within 30 days       Current Barriers:  Home Health services [facility] continue to work with pt. Knowledge Deficits related to plan of care for management of RSV- pt is eating and drinking well. Patient reports she continues to get stronger daily, went to ED on 06/26/23 for chest pain and states  it was related to the pneumonia but is better now , denies any chest pain since last conversation, pt reports she has finished prednisone  and all antibiotics.  Patient reports that she is using 3 liters of oxygen  at night and as needed during the day.   RNCM Clinical Goal(s):  Patient will work with the Care Management team over the next 30 days to address Transition of Care Barriers: Home Health services verbalize understanding of plan for management of RSV as evidenced by pt report, review of EMR and  through collaboration with RN Care manager, provider, and care team.   Interventions: Evaluation of current treatment plan related to  self management and patient's adherence to plan as established by provider RSV  (Status:  Goal on track:  Yes.)  Short Term Goal Evaluation of current treatment plan related to  RSV ,  home health has not seen patient,   self-management and patient's adherence to plan as established by provider. Discussed plans with patient for ongoing care management follow up and provided patient with direct contact information for care management team Reviewed all upcoming scheduled appointments including Dr. Shannon on 07/09/23 Reviewed with patient importance of healthy diet. Reviewed with patient to continue to take her medications as  prescribed and call MD for additional concerns. Reinforced safety precautions, importance of using cane In basket sent to Luke Griffiths RN and ask her to cancel appointment scheduled with pt for 2/11, pt wants to complete the 30 day TOC program, then will decide if she wants further care mangement  Patient Goals/Self-Care Activities: Participate in Transition of Care Program/Attend [facility] scheduled calls Notify RN Care Manager of TOC call rescheduling needs Take all medications as prescribed Attend all scheduled provider appointments Call pharmacy for medication refills 3-7 days in advance of running out of medications Perform all self care activities independently  Call provider office for new concerns or questions  Please contact your doctor for any unresolved/ worsening symptoms- cough, wheezing, fever Call 911 for shortness of breath, chest pain Use oxygen  as prescribed Please use your cane, keep pathways clear, do not get in a hurry and ask for assistance as needed Follow up with Dr. Shannon on 07/09/23 @ 1045 am  Follow Up Plan:  Telephone follow up appointment with care management team member scheduled for:  07/15/23 @ 10: 15 am           Our next appointment is by telephone on 07/15/23 at 1015 am  Please call the care guide team at [phone] if you need to cancel or reschedule your appointment.   If you are experiencing a Mental Health or Behavioral Health Crisis or need someone to talk to, please call the Suicide and Crisis Lifeline: 988 call the  [place]  National Suicide Prevention Lifeline: 918-582-4620 or TTY: 520-572-9904 TTY (502)872-2250) to talk to a trained counselor call 1-800-273-TALK (toll free, 24 hour hotline) go to [facility] [address], [place] [id number]) call the [facility] Crisis Line: 680-330-8848 call 911   Patient verbalizes understanding of instructions and care plan provided today and agrees to view in  MyChart. Active MyChart status and patient understanding of how to access instructions and care plan via MyChart confirmed with patient.     Mliss Creed [facility], BSN RN Care Manager/ Transition of Care [place]/ [facility] [phone]

## 2023-07-09 ENCOUNTER — Telehealth: Payer: Self-pay | Admitting: *Deleted

## 2023-07-09 ENCOUNTER — Encounter: Payer: Self-pay | Admitting: Radiation Oncology

## 2023-07-09 ENCOUNTER — Ambulatory Visit
Admission: RE | Admit: 2023-07-09 | Discharge: 2023-07-09 | Disposition: A | Discharge status: Home / Self Care | Payer: Medicare Other | Source: Ambulatory Visit | Attending: Radiation Oncology | Admitting: Radiation Oncology

## 2023-07-09 ENCOUNTER — Other Ambulatory Visit: Payer: Self-pay | Admitting: *Deleted

## 2023-07-09 VITALS — BP 152/97 | HR 71 | Temp 97.8°F | Resp 24 | Ht 62.0 in | Wt 187.0 lb

## 2023-07-09 DIAGNOSIS — Z7952 Long term (current) use of systemic steroids: Secondary | ICD-10-CM | POA: Diagnosis not present

## 2023-07-09 DIAGNOSIS — R300 Dysuria: Secondary | ICD-10-CM

## 2023-07-09 DIAGNOSIS — M8580 Other specified disorders of bone density and structure, unspecified site: Secondary | ICD-10-CM | POA: Insufficient documentation

## 2023-07-09 DIAGNOSIS — Z79899 Other long term (current) drug therapy: Secondary | ICD-10-CM | POA: Diagnosis not present

## 2023-07-09 DIAGNOSIS — R918 Other nonspecific abnormal finding of lung field: Secondary | ICD-10-CM | POA: Insufficient documentation

## 2023-07-09 DIAGNOSIS — Z923 Personal history of irradiation: Secondary | ICD-10-CM | POA: Diagnosis not present

## 2023-07-09 DIAGNOSIS — Z7901 Long term (current) use of anticoagulants: Secondary | ICD-10-CM | POA: Diagnosis not present

## 2023-07-09 DIAGNOSIS — K5900 Constipation, unspecified: Secondary | ICD-10-CM | POA: Insufficient documentation

## 2023-07-09 DIAGNOSIS — Z7951 Long term (current) use of inhaled steroids: Secondary | ICD-10-CM | POA: Insufficient documentation

## 2023-07-09 DIAGNOSIS — K5730 Diverticulosis of large intestine without perforation or abscess without bleeding: Secondary | ICD-10-CM | POA: Insufficient documentation

## 2023-07-09 DIAGNOSIS — G9389 Other specified disorders of brain: Secondary | ICD-10-CM | POA: Diagnosis not present

## 2023-07-09 DIAGNOSIS — I700 Atherosclerosis of aorta: Secondary | ICD-10-CM | POA: Diagnosis not present

## 2023-07-09 DIAGNOSIS — Z7984 Long term (current) use of oral hypoglycemic drugs: Secondary | ICD-10-CM | POA: Diagnosis not present

## 2023-07-09 DIAGNOSIS — C541 Malignant neoplasm of endometrium: Secondary | ICD-10-CM | POA: Insufficient documentation

## 2023-07-09 LAB — URINALYSIS, ROUTINE W REFLEX MICROSCOPIC
Bacteria, UA: NONE SEEN
Bilirubin Urine: NEGATIVE
Glucose, UA: NEGATIVE mg/dL
Hgb urine dipstick: NEGATIVE
Ketones, ur: NEGATIVE mg/dL
Leukocytes,Ua: NEGATIVE
Nitrite: NEGATIVE
Protein, ur: 30 mg/dL — AB
Specific Gravity, Urine: 1.013 (ref 1.005–1.030)
pH: 6 (ref 5.0–8.0)

## 2023-07-09 NOTE — Telephone Encounter (Signed)
 Rebeca from radiation sent in basket message regarding the patient follow up appt with GYN ONC. Patient scheduled to see Dr Daisey Dryer on 5/5 at 1:30 pm.  Rebeca to reach out to the patient for appt date/time.

## 2023-07-09 NOTE — Progress Notes (Signed)
 Amber Stephenson is here today for follow up post radiation to the pelvic.  They completed their radiation on: 03/31/2023  Does the patient complain of any of the following:  Pain: Yes, she reports pain in lower back. Abdominal bloating:Yes Diarrhea/Constipation: Denies Nausea/Vomiting: Denies Vaginal Discharge: Denies Blood in Urine or Stool: Denies Urinary Issues (dysuria/incomplete emptying/ incontinence/ increased frequency/urgency): She reports  Dysuria and frequency Does patient report using vaginal dilator 2-3 times a week and/or sexually active 2-3 weeks: She reports not using dilators or being sexually active. Post radiation skin changes: Denies    BP (!) 152/97 (BP Location: Left Arm, Patient Position: Sitting, Cuff Size: Large)   Pulse 71   Temp 97.8 F (36.6 C)   Resp (!) 24   Ht 5' 2 (1.575 m)   Wt 187 lb (84.8 kg)   SpO2 97%   BMI 34.20 kg/m  :

## 2023-07-10 ENCOUNTER — Ambulatory Visit (INDEPENDENT_AMBULATORY_CARE_PROVIDER_SITE_OTHER): Payer: Medicare Other | Admitting: Family Medicine

## 2023-07-10 ENCOUNTER — Encounter: Payer: Self-pay | Admitting: Family Medicine

## 2023-07-10 ENCOUNTER — Ambulatory Visit (HOSPITAL_COMMUNITY)
Admission: RE | Admit: 2023-07-10 | Discharge: 2023-07-10 | Disposition: A | Discharge status: Home / Self Care | Payer: Medicare Other | Source: Ambulatory Visit | Attending: Family Medicine

## 2023-07-10 ENCOUNTER — Other Ambulatory Visit: Payer: Self-pay | Admitting: Family Medicine

## 2023-07-10 ENCOUNTER — Telehealth: Payer: Self-pay

## 2023-07-10 ENCOUNTER — Telehealth: Payer: Self-pay | Admitting: Radiation Oncology

## 2023-07-10 VITALS — BP 150/81 | HR 94 | Temp 98.5°F | Ht 62.0 in | Wt 183.0 lb

## 2023-07-10 DIAGNOSIS — R0602 Shortness of breath: Secondary | ICD-10-CM

## 2023-07-10 DIAGNOSIS — I110 Hypertensive heart disease with heart failure: Secondary | ICD-10-CM | POA: Diagnosis not present

## 2023-07-10 DIAGNOSIS — M7989 Other specified soft tissue disorders: Secondary | ICD-10-CM

## 2023-07-10 DIAGNOSIS — I5032 Chronic diastolic (congestive) heart failure: Secondary | ICD-10-CM | POA: Diagnosis not present

## 2023-07-10 DIAGNOSIS — J45901 Unspecified asthma with (acute) exacerbation: Secondary | ICD-10-CM | POA: Diagnosis not present

## 2023-07-10 DIAGNOSIS — J324 Chronic pansinusitis: Secondary | ICD-10-CM | POA: Diagnosis not present

## 2023-07-10 DIAGNOSIS — F411 Generalized anxiety disorder: Secondary | ICD-10-CM

## 2023-07-10 DIAGNOSIS — M25551 Pain in right hip: Secondary | ICD-10-CM

## 2023-07-10 DIAGNOSIS — M25552 Pain in left hip: Secondary | ICD-10-CM

## 2023-07-10 DIAGNOSIS — F1320 Sedative, hypnotic or anxiolytic dependence, uncomplicated: Secondary | ICD-10-CM

## 2023-07-10 DIAGNOSIS — E1142 Type 2 diabetes mellitus with diabetic polyneuropathy: Secondary | ICD-10-CM | POA: Diagnosis not present

## 2023-07-10 DIAGNOSIS — B974 Respiratory syncytial virus as the cause of diseases classified elsewhere: Secondary | ICD-10-CM | POA: Diagnosis not present

## 2023-07-10 NOTE — Telephone Encounter (Signed)
 Copied from CRM [id number]. Topic: Clinical - Home Health Verbal Orders >> Jul 10, 2023 10:15 AM Carmell SAUNDERS wrote: Caller/Agency: [facility] Callback Number: [phone] Service Requested: Occupational Therapy Frequency: n/a Any new concerns about the patient? No, but looking to get an ok for OT next week. Patient declined this week due to too many appts. Please f/u

## 2023-07-10 NOTE — Telephone Encounter (Signed)
 Patient already scheduled and aware. LS

## 2023-07-10 NOTE — Progress Notes (Signed)
 Subjective:  Patient ID: Amber Stephenson, female    DOB: 01-04-1946, 78 y.o.   MRN: [id number]  Patient Care Team: [name], MD as PCP - General (Family Medicine) Shlomo Wilbert SAUNDERS, MD as PCP - Cardiology (Cardiology) Kay Lynwood SQUIBB, MD as Referring Physician (Orthopedic Surgery) Tasia Lung, MD as Consulting Physician (Psychiatry) Georjean Darice HERO, MD as Consulting Physician (Neurology) Shamleffer, Donell Cardinal, MD as Consulting Physician (Endocrinology) Charmayne Molly, MD as Consulting Physician (Ophthalmology) Roddie Bring, DPM as Consulting Physician (Podiatry) Shlomo Wilbert SAUNDERS, MD as Consulting Physician (Cardiology) Shlomo Wilbert SAUNDERS, MD as Consulting Physician (Cardiology) Ramonita Suzen CROME, RN as [facility] Care Management Dina, Sara E, PA-C (Neurology) Neda Jennet LABOR, MD as Consulting Physician (Pulmonary Disease) Parrett, Madelin RAMAN, NP as Nurse Practitioner (Pulmonary Disease) Aura Mliss LABOR, RN as [facility] Care Management Aura Mliss LABOR, RN as [facility] Care Management   Chief Complaint:  Hip Pain and Anxiety   HPI: Amber Stephenson is a 78 y.o. female presenting on 07/10/2023 for Hip Pain and Anxiety Patient presents today with husband. States that she originally made this appt for follow up of hip pain after she fell trying to move a chair. States that her PT recommended that she follow up with PCP. Hip pain started 2.5 weeks after her fall. She is established with pain management and followed up with them last Friday. However, patient and husband are concerned for her RIGHT lower leg. Since fall she has had bruising, swelling, elevated hematoma. States that she has worsening pain, shortness of breath, and anxiety as well. She has history of afib, is taking eliquis . She also has a history of cancer and has recently completed treatments. States that she has been so anxious that she has not slept in the last 3 nights.  In addition, reports that she has been working with psychiatry to taper off. She states that she is out of benzodiazepine. Ran out 1.5 weeks ago. States that she is not due for a refill until next week, but she was taking more than prescribed due to increased anxiety and trouble sleeping. States that she has been hospitalized for anxiety and depression in the past.   Relevant past medical, surgical, family, and social history reviewed and updated as indicated.  Allergies and medications reviewed and updated. Data reviewed: Chart in Epic.   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   Benign paroxysmal positional vertigo due to bilateral vestibular disorder 05/20/2019   Bipolar 1 disorder (HCC) 01/23/2015   with GAD, benzo dependence   BPPV (benign paroxysmal positional vertigo) 05/20/2019   CAD (coronary artery disease)    Nonobstructive; Managed by Dr. Charls   Cardiomegaly 01/12/2018   CHF (congestive heart failure) 06/08/2022   Chronic back pain 01/04/2015   Chronic constipation 04/25/2020   Chronic diastolic heart failure (HCC) 05/30/2015   Chronic pain syndrome 08/22/2019   back pain, sacroiliitis   Chronic pain syndrome 08/22/2019   Chronic post-traumatic stress disorder (PTSD) 12/06/2020   Diabetic neuropathy (HCC) 02/06/2016   Dyslipidemia 09/24/2020   Dyspnea on exertion    Essential hypertension    Family history of  coronary arteriosclerosis 05/30/2015   Functional diarrhea 10/26/2020   Head trauma 09/17/2020   Hemorrhoids 03/11/2022   Herpes genitalis in women 07/16/2015   History of adenomatous polyp of colon 05/21/2019    Overview:   03/31/17: Colonoscopy: nonadvanced adenoma, microscopic colitis, f/u 5 yrs, [facility]/GAP   History of radiation therapy    Endometrial- 02/18/23-03/31/23-Dr. Lynwood Nasuti   Insomnia 01/23/2015   Lipoma 02/08/2015   Migraine headache with aura 02/12/2016   Myofascial pain dysfunction syndrome 08/22/2019   Myofascial pain dysfunction syndrome 08/22/2019   Non-alcoholic fatty liver disease 01/12/2018   Opioid dependence (HCC) 12/06/2020   OSA (obstructive sleep apnea) 02/24/2019   10/09/2018 - HST  - AHI 40.6    Osteopenia 12/06/2020   Rx alendronate  35 mg.   Pinguecula 12/06/2020   Postcoital bleeding 12/06/2020   Presbyopia 12/06/2020   Pulmonary hypertension    RLS (restless legs syndrome) 04/27/2015   Superficial fungus infection of skin 09/03/2021   Tremor, essential 12/11/2021   Type II diabetes mellitus (HCC) 09/24/2020    Past Surgical History:  Procedure Laterality Date   BREAST REDUCTION SURGERY     EYE SURGERY Right    cateracts   HAMMER TOE SURGERY     LEFT HEART CATH AND CORONARY ANGIOGRAPHY N/A 02/03/2018   Procedure: LEFT HEART CATH AND CORONARY ANGIOGRAPHY;  Surgeon: Jordan, Peter M, MD;  Location: [facility] INVASIVE CV LAB;  Service: Cardiovascular;  Laterality: N/A;   REVERSE SHOULDER ARTHROPLASTY Right 08/19/2019   Procedure: REVERSE SHOULDER ARTHROPLASTY;  Surgeon: Kay Kemps, MD;  Location: [facility] ORS;  Service: Orthopedics;  Laterality: Right;  interscalene block   SHOULDER SURGERY Right    I BROKE MY SHOUDLER   THIGH SURGERY     TO REMOVE A TUMOR     Social History   Socioeconomic History   Marital status: Married    Spouse name: Dwight   Number of children: 3   Years of education: 15   Highest education level: Associate degree: academic program  Occupational History   Occupation: Retired    Comment: chief financial officer  Tobacco Use   Smoking status: Never   Smokeless tobacco: Never  Vaping Use   Vaping status: Never Used  Substance and Sexual  Activity   Alcohol  use: Not Currently    Alcohol /week: 0.0 standard drinks of alcohol    Drug use: No   Sexual activity: Yes    Partners: Male    Birth control/protection: Post-menopausal  Other Topics Concern   Not on file  Social History Narrative   Lives at home with husband.    They have 3 children who live away - [place] , [place] , and [place] .   She is from [place]  and most of her family lives there.   Her husbands's family live nearby   Right handed.   Social Drivers of Corporate Investment Banker Strain: Low Risk  (01/23/2023)   Overall Financial Resource Strain (CARDIA)    Difficulty of Paying Living Expenses: Not hard at all  Food Insecurity: No Food Insecurity (06/16/2023)   Hunger Vital Sign    Worried About Running Out of Food in the Last Year: Never true    Ran Out of Food in the Last Year: Never true  Transportation Needs: No Transportation Needs (06/16/2023)   PRAPARE - Administrator, Civil Service (Medical): No    Lack of Transportation (Non-Medical): No  Physical Activity: Inactive (01/23/2023)   Exercise Vital Sign    Days of Exercise per Week: 0  days    Minutes of Exercise per Session: 0 min  Stress: No Stress Concern Present (01/23/2023)   [employer] of Occupational Health - Occupational Stress Questionnaire    Feeling of Stress : Not at all  Social Connections: Unknown (06/10/2023)   Social Connection and Isolation Panel [NHANES]    Frequency of Communication with Friends and Family: More than three times a week    Frequency of Social Gatherings with Friends and Family: Patient declined    Attends Religious Services: Patient declined    Database Administrator or Organizations: Patient declined    Attends Banker Meetings: Patient declined    Marital Status: Married  Catering Manager Violence: Not At Risk (06/16/2023)   Humiliation, Afraid, Rape, and Kick questionnaire    Fear of Current or Ex-Partner: No    Emotionally  Abused: No    Physically Abused: No    Sexually Abused: No    Outpatient Encounter Medications as of 07/10/2023  Medication Sig   albuterol  (VENTOLIN  HFA) 108 (90 Base) MCG/ACT inhaler Inhale 2 puffs into the lungs every 4 (four) hours as needed for wheezing or shortness of breath.   allopurinol  (ZYLOPRIM ) 100 MG tablet Take 100 mg by mouth daily.   amoxicillin -clavulanate (AUGMENTIN ) 875-125 MG tablet Take 1 tablet by mouth every 12 (twelve) hours.   apixaban  (ELIQUIS ) 5 MG TABS tablet Take 1 tablet (5 mg total) by mouth 2 (two) times daily.   ARIPiprazole  (ABILIFY ) 2 MG tablet Take 2 mg by mouth at bedtime.   azithromycin  (ZITHROMAX  Z-PAK) 250 MG tablet Take 1 tablet (250 mg total) by mouth daily. Take 2 tablets the first day followed by 1 tablet daily.   benzonatate  (TESSALON ) 100 MG capsule TAKE 2 CAPSULES(200 MG) BY MOUTH THREE TIMES DAILY AS NEEDED FOR COUGH   clotrimazole  (MYCELEX ) 10 MG troche Take 10 mg by mouth in the morning and at bedtime.   cyanocobalamin  (VITAMIN B12) 1000 MCG tablet Take 1 tablet (1,000 mcg total) by mouth daily.   denosumab  (PROLIA ) 60 MG/ML SOSY injection Inject 60 mg into the skin every 6 (six) months.   doxepin  (SINEQUAN ) 10 MG capsule Take 10 mg by mouth at bedtime.   escitalopram  (LEXAPRO ) 10 MG tablet Take 10 mg by mouth daily.   fluconazole  (DIFLUCAN ) 100 MG tablet Take two with first dose. Then starting the next day take one daily until all are taken.   fluticasone -salmeterol (ADVAIR DISKUS) 100-50 MCG/ACT AEPB Inhale 1 puff into the lungs 2 (two) times daily. (Patient taking differently: Inhale 1 puff into the lungs 2 (two) times daily as needed (for flares).)   Galcanezumab -gnlm (EMGALITY ) 120 MG/ML SOAJ INJECT CONTENTS ON 1 PEN(120MG ) INTO THE SKIN ONCE MONTHLY (Patient taking differently: Inject 120 mg into the skin every 30 (thirty) days.)   Insulin  Disposable Pump (OMNIPOD 5 G7 INTRO, GEN 5,) KIT 1 Device by Does not apply route every other day.    Insulin  Disposable Pump (OMNIPOD 5 G7 PODS, GEN 5,) MISC 1 Device by Does not apply route every other day.   insulin  glargine (LANTUS  SOLOSTAR) 100 UNIT/ML Solostar Pen Inject 32 Units into the skin 2 (two) times daily.   insulin  lispro (HUMALOG  KWIKPEN) 100 UNIT/ML KwikPen Max daily 45 units (Patient taking differently: Inject 6-45 Units into the skin 3 (three) times daily. Max daily 45 units)   Insulin  Pen Needle 29G X MISC 1 Device by Does not apply route daily in the afternoon.   levocetirizine (XYZAL  ALLERGY  24HR) 5 MG tablet Take 1 tablet (5 mg total) by mouth every evening. For itch   lidocaine  (XYLOCAINE ) 2 % solution Use as directed 15 mLs in the mouth or throat every 4 (four) hours as needed for mouth pain.   LORazepam  (ATIVAN ) 0.5 MG tablet Take 0.5-1 mg by mouth See admin instructions. Take 0.5 mg by mouth in the morning and 1 mg at bedtime   losartan  (COZAAR ) 25 MG tablet Take 25 mg by mouth daily.   meclizine  (ANTIVERT ) 25 MG tablet Take 1 tablet (25 mg total) by mouth 3 (three) times daily as needed for dizziness.   metFORMIN  (GLUCOPHAGE -XR) 500 MG 24 hr tablet Take 2 tablets (1,000 mg total) by mouth daily with breakfast.   methocarbamol  1000 MG TABS Take 1,000 mg by mouth every 6 (six) hours as needed for muscle spasms.   metoprolol  tartrate (LOPRESSOR ) 50 MG tablet Take 1 tablet (50 mg total) by mouth 2 (two) times daily.   naloxone  (NARCAN ) nasal spray 4 mg/0.1 mL Place 1 spray into the nose once as needed (AS DIRECTED).   nystatin  (MYCOSTATIN /NYSTOP ) powder Apply 1 Application topically as needed (rash).   nystatin  ointment (MYCOSTATIN ) Apply 1 Application topically 2 (two) times daily as needed (for rashes).   omeprazole  (PRILOSEC) 40 MG capsule Take 40 mg by mouth daily.   ondansetron  (ZOFRAN ) 4 MG tablet Take 1 tablet (4 mg total) by mouth every 8 (eight) hours as needed for nausea or vomiting. For cancer related nausea   OXcarbazepine  (TRILEPTAL ) 150 MG tablet Take 1  tablet (150 mg total) by mouth 2 (two) times daily.   Oxycodone  HCl 20 MG TABS Take 1 tablet (20 mg total) by mouth 2 (two) times daily as needed. Do Not Fill Before 08/09/2023   pantoprazole  (PROTONIX ) 40 MG tablet Take 40 mg by mouth daily as needed (for reflux).   predniSONE  (DELTASONE ) 20 MG tablet Take 1 tablet (20 mg total) by mouth 2 (two) times daily with a meal.   pregabalin  (LYRICA ) 200 MG capsule Take 200 mg by mouth 2 (two) times daily.   promethazine  (PHENERGAN ) 25 MG tablet Take 0.5-1 tablets (12.5-25 mg total) by mouth every 6 (six) hours as needed for refractory nausea / vomiting (cancer related nausea/vomiting).   promethazine -dextromethorphan  (PROMETHAZINE -DM) 6.25-15 MG/5ML syrup Take 5 mLs by mouth 4 (four) times daily as needed for cough.   rOPINIRole  (REQUIP ) 1 MG tablet Take 1 tablet (1 mg total) by mouth at bedtime. For leg cramps   senna-docusate (SENOKOT-S) 8.6-50 MG tablet Take 1 tablet by mouth at bedtime.   tirzepatide  (MOUNJARO ) 10 MG/0.5ML Pen Inject 10 mg into the skin once a week.   TYLENOL  500 MG tablet Take 500-1,000 mg by mouth every 6 (six) hours as needed for mild pain (pain score 1-3) or headache.   Vitamin D , Cholecalciferol , 25 MCG (1000 UT) TABS Take 1,000 Units by mouth daily in the afternoon.   atorvastatin  (LIPITOR) 80 MG tablet Take 1 tablet (80 mg total) by mouth daily.   potassium chloride  (KLOR-CON ) 10 MEQ tablet Take 1 tablet (10 mEq total) by mouth daily.   spironolactone  (ALDACTONE ) 25 MG tablet Take 0.5 tablets (12.5 mg total) by mouth daily.   Facility-Administered Encounter Medications as of 07/10/2023  Medication   bupivacaine -epinephrine  (MARCAINE  W/ EPI) 0.5% -1:200000 injection   denosumab  (PROLIA ) injection 60 mg    Allergies  Allergen Reactions   Iodine Anaphylaxis   Ivp Dye [Iodinated Contrast Media] Anaphylaxis, Swelling and Other (See Comments)  Throat closes    Latex Other (See Comments)    Latex tape pulls skin with it    Tape Other (See Comments)    Pulls off the skin, if latex   Tizanidine  Other (See Comments)    Weakness, goofy, bad dreams    Review of Systems As per HPI  Objective:  BP (!) 150/81   Pulse 94   Temp 98.5 F (36.9 C)   Ht 5' 2 (1.575 m)   Wt 183 lb (83 kg)   SpO2 93%   BMI 33.47 kg/m    Wt Readings from Last 3 Encounters:  07/10/23 183 lb (83 kg)  07/09/23 187 lb (84.8 kg)  07/03/23 185 lb (83.9 kg)   Physical Exam Constitutional:      General: She is awake. She is not in acute distress.    Appearance: She is well-developed and well-groomed. She is ill-appearing. She is not toxic-appearing or diaphoretic.  Cardiovascular:     Rate and Rhythm: Normal rate. Rhythm irregularly irregular.     Heart sounds: Normal heart sounds.  Pulmonary:     Effort: Tachypnea, accessory muscle usage and prolonged expiration present.     Breath sounds: Transmitted upper airway sounds present. No wheezing, rhonchi or rales.     Comments: Increased WOB Musculoskeletal:     Right lower leg: Edema present.       Legs:     Comments: Hard, tender, erythematous nodule with surrounding area of ecchymosis. Right calf 32.5 cm. Left calf 30 cm.   Skin:    General: Skin is warm.     Capillary Refill: Capillary refill takes less than 2 seconds.  Neurological:     General: No focal deficit present.     Mental Status: She is alert, oriented to person, place, and time and easily aroused. Mental status is at baseline.  Psychiatric:        Mood and Affect: Mood is anxious.        Speech: Speech is rapid and pressured.        Behavior: Behavior is hyperactive. Behavior is cooperative.        Thought Content: Thought content normal.        Cognition and Memory: Cognition and memory normal.    Results for orders placed or performed in visit on 07/09/23  Urine culture   Collection Time: 07/09/23 11:31 AM   Specimen: Urine, Clean Catch  Result Value Ref Range   Specimen Description      URINE, CLEAN  CATCH Performed at [facility], [address], [place], [place] [address]    Special Requests      NONE Performed at [facility], [address], [place], [place] [address]    Culture      CULTURE REINCUBATED FOR BETTER GROWTH Performed at [name], [address], [place], [place] [address]    Report Status PENDING   Urinalysis, Routine w reflex microscopic   Collection Time: 07/09/23 11:31 AM  Result Value Ref Range   Color, Urine YELLOW YELLOW   APPearance CLEAR CLEAR   Specific Gravity, Urine 1.013 1.005 - 1.030   pH 6.0 5.0 - 8.0   Glucose, UA NEGATIVE NEGATIVE mg/dL   Hgb urine dipstick NEGATIVE NEGATIVE   Bilirubin Urine NEGATIVE NEGATIVE   Ketones, ur NEGATIVE NEGATIVE mg/dL   Protein, ur 30 (A) NEGATIVE mg/dL   Nitrite NEGATIVE NEGATIVE   Leukocytes,Ua NEGATIVE NEGATIVE   RBC /  HPF 0-5 0 - 5 RBC/hpf   WBC, UA 0-5 0 - 5 WBC/hpf   Bacteria, UA NONE SEEN NONE SEEN   Squamous Epithelial / HPF 0-5 0 - 5 /HPF   *Note: Due to a large number of results and/or encounters for the requested time period, some results have not been displayed. A complete set of results can be found in Results Review.       07/10/2023    2:57 PM 06/30/2023    3:56 PM 06/15/2023    1:16 PM 06/05/2023    4:11 PM 05/25/2023   10:00 AM  Depression screen PHQ 2/9  Decreased Interest 0 0 0 0 0  Down, Depressed, Hopeless 0 0 0 0 0  PHQ - 2 Score 0 0 0 0 0  Altered sleeping     0  Tired, decreased energy     0  Change in appetite     0  Feeling bad or failure about yourself      0  Trouble concentrating     0  Moving slowly or fidgety/restless     0  Suicidal thoughts     0  PHQ-9 Score     0  Difficult doing work/chores     Not difficult at all       07/10/2023    2:57 PM 05/25/2023   10:00 AM 05/12/2023   11:15 AM 11/14/2022    2:22 PM  GAD 7 : Generalized Anxiety Score  Nervous, Anxious, on Edge 0 0 2 0  Control/stop worrying 0  0 2 0  Worry too much - different things 0 0 2 0  Trouble relaxing 0 0 2 0  Restless 0 0 3 0  Easily annoyed or irritable 0 0 3 0  Afraid - awful might happen 0 0 3 0  Total GAD 7 Score 0 0 17 0  Anxiety Difficulty Not difficult at all Not difficult at all Somewhat difficult Not difficult at all   Pertinent labs & imaging results that were available during my care of the patient were reviewed by me and considered in my medical decision making.  Assessment & Plan:  Sneha was seen today for hip pain and anxiety.  Diagnoses and all orders for this visit:   1. Leg swelling (Primary) Based on Wells criteria will order imaging of lower extremity. Order placed for left leg. Sonographer called to change order to right leg. Error fixed. Changed to right leg.  - US  Venous Img Lower Unilateral Left (DVT); Future  2. Shortness of breath As above.   3. Generalized anxiety disorder Patient is to follow up with psychiatry. Believe that some of her symptoms may be due to lack of benzodiazepine/withdrawal. Provided patient and family with [facility] urgent care information.   4. Benzodiazepine dependence As above.   5. Bilateral hip pain Reviewed MR of Pelvis 07/05/23. No acute osseous abnormalities. Will have patient follow up with PCP and pain management for further evaluation and treatment. Will rule out DVT first.  MR Pelvis W Wo Contrast (Order #[id number]) on 07/08/2023 - Order Result History Report   Continue all other maintenance medications.  Follow up plan: Return for hip pain .  Continue healthy lifestyle choices, including diet (rich in fruits, vegetables, and lean proteins, and low in salt and simple carbohydrates) and exercise (at least 30 minutes of moderate physical activity daily).  Written and verbal instructions provided   The above assessment and management plan was discussed with the  patient. The patient verbalized understanding of and has agreed to the management plan. Patient is  aware to call the clinic if they develop any new symptoms or if symptoms persist or worsen. Patient is aware when to return to the clinic for a follow-up visit. Patient educated on when it is appropriate to go to the emergency department.   Marry Kins, DNP-FNP [facility] [address] [place], [place] [address] [phone]

## 2023-07-10 NOTE — Telephone Encounter (Signed)
 LMOVM VO to wait till next week

## 2023-07-10 NOTE — Telephone Encounter (Signed)
 Spoke to pt to advise of 5/5 f/u appt with Dr. Daisey Dryer. Pt verbalized understanding and asked about urinalysis done 2/6; call transferred to nurse Tarra.

## 2023-07-10 NOTE — Patient Instructions (Signed)
 [facility]  Phone:  [phone]  Address:  [address]  [place], [place] [address]  Hours:  Open 24/7, No appointment required.    The [facility] ([facility] is centrally located in the [place] at [address], [place]

## 2023-07-11 LAB — URINE CULTURE: Culture: 10000 — AB

## 2023-07-13 ENCOUNTER — Other Ambulatory Visit: Payer: Self-pay | Admitting: Radiology

## 2023-07-13 ENCOUNTER — Ambulatory Visit (INDEPENDENT_AMBULATORY_CARE_PROVIDER_SITE_OTHER): Payer: Medicare Other | Admitting: Family Medicine

## 2023-07-13 ENCOUNTER — Telehealth: Payer: Self-pay

## 2023-07-13 ENCOUNTER — Ambulatory Visit (INDEPENDENT_AMBULATORY_CARE_PROVIDER_SITE_OTHER): Payer: Medicare Other

## 2023-07-13 VITALS — BP 135/88 | HR 78 | Temp 97.3°F | Ht 62.0 in | Wt 181.0 lb

## 2023-07-13 DIAGNOSIS — K146 Glossodynia: Secondary | ICD-10-CM

## 2023-07-13 DIAGNOSIS — M25511 Pain in right shoulder: Secondary | ICD-10-CM

## 2023-07-13 DIAGNOSIS — F5101 Primary insomnia: Secondary | ICD-10-CM | POA: Diagnosis not present

## 2023-07-13 DIAGNOSIS — M810 Age-related osteoporosis without current pathological fracture: Secondary | ICD-10-CM

## 2023-07-13 DIAGNOSIS — M4804 Spinal stenosis, thoracic region: Secondary | ICD-10-CM | POA: Diagnosis not present

## 2023-07-13 DIAGNOSIS — N390 Urinary tract infection, site not specified: Secondary | ICD-10-CM

## 2023-07-13 DIAGNOSIS — S20229A Contusion of unspecified back wall of thorax, initial encounter: Secondary | ICD-10-CM | POA: Diagnosis not present

## 2023-07-13 DIAGNOSIS — M129 Arthropathy, unspecified: Secondary | ICD-10-CM | POA: Diagnosis not present

## 2023-07-13 DIAGNOSIS — M47814 Spondylosis without myelopathy or radiculopathy, thoracic region: Secondary | ICD-10-CM | POA: Diagnosis not present

## 2023-07-13 DIAGNOSIS — M25411 Effusion, right shoulder: Secondary | ICD-10-CM

## 2023-07-13 DIAGNOSIS — Z96611 Presence of right artificial shoulder joint: Secondary | ICD-10-CM | POA: Diagnosis not present

## 2023-07-13 DIAGNOSIS — M546 Pain in thoracic spine: Secondary | ICD-10-CM | POA: Diagnosis not present

## 2023-07-13 DIAGNOSIS — Z043 Encounter for examination and observation following other accident: Secondary | ICD-10-CM | POA: Diagnosis not present

## 2023-07-13 MED ORDER — NITROFURANTOIN MONOHYD MACRO 100 MG PO CAPS: 100.0000 mg | ORAL_CAPSULE | Freq: Two times a day (BID) | ORAL | 0 refills | Status: DC

## 2023-07-13 MED ORDER — FLUCONAZOLE 100 MG PO TABS: 100.0000 mg | ORAL_TABLET | Freq: Every day | ORAL | 0 refills | Status: DC

## 2023-07-13 NOTE — Telephone Encounter (Signed)
 Copied from CRM [id number]. Topic: Clinical - Medical Advice >> Jul 10, 2023  4:39 PM Georgeann Kindred wrote: Reason for CRM: Sonographer is requesting to change information in chart from patient's Left and to Right leg.

## 2023-07-13 NOTE — Progress Notes (Signed)
Left back arm

## 2023-07-13 NOTE — Progress Notes (Signed)
 Antibiotic sent to patient's pharmacy for positive urine culture. Called the patient to inform her of the results and encouraged her to pick up her prescription at her earliest convenience.     Julio Ohm, PA-C

## 2023-07-13 NOTE — Telephone Encounter (Signed)
 Completed.

## 2023-07-14 ENCOUNTER — Encounter: Payer: Self-pay | Admitting: *Deleted

## 2023-07-14 ENCOUNTER — Telehealth: Payer: Self-pay | Admitting: Family Medicine

## 2023-07-14 ENCOUNTER — Telehealth: Payer: Self-pay

## 2023-07-14 ENCOUNTER — Ambulatory Visit: Payer: Medicare Other

## 2023-07-14 ENCOUNTER — Encounter: Payer: Self-pay | Admitting: Family Medicine

## 2023-07-14 DIAGNOSIS — B974 Respiratory syncytial virus as the cause of diseases classified elsewhere: Secondary | ICD-10-CM | POA: Diagnosis not present

## 2023-07-14 DIAGNOSIS — J45901 Unspecified asthma with (acute) exacerbation: Secondary | ICD-10-CM | POA: Diagnosis not present

## 2023-07-14 DIAGNOSIS — E1142 Type 2 diabetes mellitus with diabetic polyneuropathy: Secondary | ICD-10-CM | POA: Diagnosis not present

## 2023-07-14 DIAGNOSIS — J324 Chronic pansinusitis: Secondary | ICD-10-CM | POA: Diagnosis not present

## 2023-07-14 DIAGNOSIS — I110 Hypertensive heart disease with heart failure: Secondary | ICD-10-CM | POA: Diagnosis not present

## 2023-07-14 DIAGNOSIS — I5032 Chronic diastolic (congestive) heart failure: Secondary | ICD-10-CM | POA: Diagnosis not present

## 2023-07-14 MED ORDER — BELSOMRA 20 MG PO TABS: 20.0000 mg | ORAL_TABLET | Freq: Every evening | ORAL | 2 refills | Status: DC | PRN

## 2023-07-14 NOTE — Telephone Encounter (Unsigned)
Copied from CRM [id number]. Topic: Clinical - Prescription Issue >> Jul 14, 2023  9:09 AM Gaetano Hawthorne wrote: Reason for CRM: Patient was seen yesterday by Dr. Darlyn Read - he was having some issues with his computer and he wasn't able to send in the prescription for Belsomra - Patient is reaching out to see if he would be able to send that to the [facility] below:  [facility] [phone] - [place], [place] - [address] AT [facility] [address]  Phone: [phone] Fax: [phone]

## 2023-07-14 NOTE — Progress Notes (Signed)
Negative for DVT. Recommend that patient continue to elevate right leg and use heat. Follow up with PCP for further management and evaluation of hip pain.

## 2023-07-14 NOTE — Telephone Encounter (Signed)
Patient calling back about Belsomra Rx again. Can Dr Darlyn Read advise on this? Did not see anything in yesterdays office notes about it.

## 2023-07-14 NOTE — Progress Notes (Unsigned)
Subjective:  Patient ID: Amber Stephenson, female    DOB: [birth year]  Age: 78 y.o. MRN: [id number]  CC: Hip Pain (Pt fell on Saturday. Having bad hip pain. Back, right shjoulder are hurting as well. Bruising. Pain level 9. ) and Mouth Lesions (Break out inside mouth wont go away. )   HPI DURA MCCORMACK presents for falling 2 days ago. Bruised herself on the right upper arm. She landed on the upper back and also has pain and bruisong of the upper spine. She takes a number of medications that can affect balance.Due to multiple conditions she is reluctant to make changes. Glade Nurse uses a walker most of the time. She is also taking eliquis for her afib  Atrial fibrillation follow up. Pt. is treated with rate control and anticoagulation. Pt.  denies palpitations, rapid rate, chest pain, dyspnea and edema. There has been no bleeding from nose or gums. Pt. has not noticed blood with urine or stool.  Although there is routine bruising easily, it is not excessive.      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), Benign paroxysmal positional vertigo due to bilateral vestibular disorder (05/20/2019), Bipolar 1 disorder (HCC) (01/23/2015), BPPV (benign paroxysmal positional vertigo) (05/20/2019), CAD (coronary artery disease), Cardiomegaly (01/12/2018), CHF (congestive heart failure) (06/08/2022), Chronic back pain (01/04/2015), Chronic constipation (04/25/2020), Chronic diastolic heart failure (HCC) (40/98/1191), Chronic  pain syndrome (08/22/2019), Chronic pain syndrome (08/22/2019), Chronic post-traumatic stress disorder (PTSD) (12/06/2020), Diabetic neuropathy (HCC) (02/06/2016), Dyslipidemia (09/24/2020), Dyspnea on exertion, Essential hypertension, Family history of coronary arteriosclerosis (05/30/2015), Functional diarrhea (10/26/2020), Head trauma (09/17/2020), Hemorrhoids (03/11/2022), Herpes genitalis in women (07/16/2015), History of adenomatous polyp of colon (05/21/2019), History of radiation therapy, Insomnia (01/23/2015), Lipoma (02/08/2015), Migraine headache with aura (02/12/2016), Myofascial pain dysfunction syndrome (08/22/2019), Myofascial pain dysfunction syndrome (08/22/2019), Non-alcoholic fatty liver disease (47/82/9562), Opioid dependence (HCC) (12/06/2020), OSA (obstructive sleep apnea) (02/24/2019), Osteopenia (12/06/2020), Pinguecula (12/06/2020), Postcoital bleeding (12/06/2020), Presbyopia (12/06/2020), Pulmonary hypertension, RLS (restless legs syndrome) (04/27/2015), Superficial fungus infection of skin (09/03/2021), Tremor, essential (12/11/2021), and Type II diabetes mellitus (HCC) (09/24/2020).   She has a past surgical history that includes THIGH SURGERY; Shoulder surgery (Right); Breast reduction surgery; Eye surgery (Right); Hammer toe surgery; LEFT HEART CATH AND CORONARY ANGIOGRAPHY (N/A, 02/03/2018); and Reverse shoulder arthroplasty (Right, 08/19/2019).   Her family history includes Alcohol abuse in her sister; Alzheimer's disease in her father; Diabetes in her brother, mother, and sister; Heart disease in her brother, mother, and sister; Mental illness in her brother; Stroke in her brother.She reports that she has never smoked. She has never used smokeless tobacco. She reports that she does not currently use alcohol. She reports that she does not use drugs.    ROS Review of Systems  Constitutional: Negative.   HENT:  Positive for mouth sores (recurring. Responded best to fluconazole  in the past).   Eyes:  Negative for visual disturbance.  Respiratory:  Positive for shortness of breath.   Cardiovascular:  Negative for chest pain.  Gastrointestinal:  Negative for abdominal pain.  Musculoskeletal:  Positive for arthralgias, gait problem (poor balance) and myalgias.  Neurological:  Positive for light-headedness.  Psychiatric/Behavioral:  Positive for sleep disturbance.     Objective:  BP 135/88   Pulse 78   Temp (!) 97.3 F (36.3 C)   Ht 5\' 2"  (1.575 m)   Wt 181 lb (82.1 kg)   SpO2 93%   BMI 33.11 kg/m   BP Readings from Last 3 Encounters:  07/13/23 135/88  07/10/23 (!) 150/81  07/09/23 (!) 152/97    Wt Readings from Last 3 Encounters:  07/13/23 181 lb (82.1 kg)  07/10/23 183 lb (83 kg)  07/09/23 187 lb (84.8 kg)     Physical Exam Constitutional:      General: She is not in acute distress.    Appearance: She is well-developed.  Cardiovascular:     Rate and Rhythm: Normal rate and regular rhythm.  Pulmonary:     Breath sounds: Normal breath sounds.  Musculoskeletal:        General: Normal range of motion.  Skin:    General: Skin is warm and dry.  Neurological:     Mental Status: She is alert and oriented to person, place, and time.       Assessment & Plan:   Jakayla was seen today for hip pain and mouth lesions.  Diagnoses and all orders for this visit:  Pain and swelling of right shoulder -     DG Shoulder Right; Future  Contusion of upper back, unspecified laterality, initial encounter -     DG Thoracic Spine 2 View; Future  Primary insomnia -     Suvorexant (BELSOMRA) 20 MG TABS; Take 1 tablet (20 mg total) by mouth at bedtime as needed.  Tongue sore -     fluconazole (DIFLUCAN) 100 MG tablet; Take 1 tablet (100 mg total) by mouth daily.       I have discontinued Shresta Risden. Sevcik's azithromycin. I am also having her start on fluconazole and Belsomra. Additionally, I am having her maintain her Vitamin D (Cholecalciferol),  Prolia, naloxone, ARIPiprazole, senna-docusate, OXcarbazepine, nystatin, cyanocobalamin, apixaban, fluticasone-salmeterol, allopurinol, doxepin, metoprolol tartrate, pregabalin, escitalopram, Emgality, TYLENOL, LORazepam, pantoprazole, spironolactone, atorvastatin, potassium chloride, Omnipod 5 G7 Pods (Gen 5), Omnipod 5 G7 Intro (Gen 5), Methocarbamol, ondansetron, promethazine, nystatin ointment, rOPINIRole, levocetirizine, losartan, lidocaine, metFORMIN, Insulin Pen Needle, insulin lispro, Lantus SoloStar, meclizine, clotrimazole, omeprazole, fluconazole, predniSONE, albuterol, benzonatate, amoxicillin-clavulanate, promethazine-dextromethorphan, tirzepatide, Oxycodone HCl, and nitrofurantoin (macrocrystal-monohydrate). We administered denosumab.  Allergies as of 07/13/2023       Reactions   Iodine Anaphylaxis   Ivp Dye [iodinated Contrast Media] Anaphylaxis, Swelling, Other (See Comments)   Throat closes   Latex Other (See Comments)   Latex tape pulls skin with it   Tape Other (See Comments)   Pulls off the skin, if latex   Tizanidine Other (See Comments)   Weakness, goofy, bad dreams        Medication List        Accurate as of July 13, 2023 11:59 PM. If you have any questions, ask your nurse or doctor.          STOP taking these medications    azithromycin 250 MG tablet Commonly known as: Zithromax Z-Pak Stopped by: Evangelia Whitaker       TAKE these medications    albuterol 108 (90 Base) MCG/ACT inhaler Commonly known as: VENTOLIN HFA Inhale 2 puffs into the lungs every 4 (four) hours as needed  for wheezing or shortness of breath.   allopurinol 100 MG tablet Commonly known as: ZYLOPRIM Take 100 mg by mouth daily.   amoxicillin-clavulanate 875-125 MG tablet Commonly known as: AUGMENTIN Take 1 tablet by mouth every 12 (twelve) hours.   apixaban 5 MG Tabs tablet Commonly known as: Eliquis Take 1 tablet (5 mg total) by mouth 2 (two) times daily.   ARIPiprazole  2 MG tablet Commonly known as: ABILIFY Take 2 mg by mouth at bedtime.   atorvastatin 80 MG tablet Commonly known as: LIPITOR Take 1 tablet (80 mg total) by mouth daily.   Belsomra 20 MG Tabs Generic drug: Suvorexant Take 1 tablet (20 mg total) by mouth at bedtime as needed. Started by: Mayco Walrond   benzonatate 100 MG capsule Commonly known as: TESSALON TAKE 2 CAPSULES(200 MG) BY MOUTH THREE TIMES DAILY AS NEEDED FOR COUGH   clotrimazole 10 MG troche Commonly known as: MYCELEX Take 10 mg by mouth in the morning and at bedtime.   cyanocobalamin 1000 MCG tablet Commonly known as: VITAMIN B12 Take 1 tablet (1,000 mcg total) by mouth daily.   doxepin 10 MG capsule Commonly known as: SINEQUAN Take 10 mg by mouth at bedtime.   Emgality 120 MG/ML Soaj Generic drug: Galcanezumab-gnlm INJECT CONTENTS ON 1 PEN(120MG ) INTO THE SKIN ONCE MONTHLY What changed:  how much to take how to take this when to take this additional instructions   escitalopram 10 MG tablet Commonly known as: LEXAPRO Take 10 mg by mouth daily.   fluconazole 100 MG tablet Commonly known as: Diflucan Take two with first dose. Then starting the next day take one daily until all are taken. What changed: Another medication with the same name was added. Make sure you understand how and when to take each. Changed by: Tari Lecount   fluconazole 100 MG tablet Commonly known as: Diflucan Take 1 tablet (100 mg total) by mouth daily. What changed: You were already taking a medication with the same name, and this prescription was added. Make sure you understand how and when to take each. Changed by: Demitria Hay   fluticasone-salmeterol 100-50 MCG/ACT Aepb Commonly known as: Advair Diskus Inhale 1 puff into the lungs 2 (two) times daily. What changed:  when to take this reasons to take this   insulin lispro 100 UNIT/ML KwikPen Commonly known as: HumaLOG KwikPen Max daily 45 units What changed:  how  much to take how to take this when to take this   Insulin Pen Needle 29G X Misc 1 Device by Does not apply route daily in the afternoon.   Lantus SoloStar 100 UNIT/ML Solostar Pen Generic drug: insulin glargine Inject 32 Units into the skin 2 (two) times daily.   levocetirizine 5 MG tablet Commonly known as: Xyzal Allergy 24HR Take 1 tablet (5 mg total) by mouth every evening. For itch   lidocaine 2 % solution Commonly known as: XYLOCAINE Use as directed 15 mLs in the mouth or throat every 4 (four) hours as needed for mouth pain.   LORazepam 0.5 MG tablet Commonly known as: ATIVAN Take 0.5-1 mg by mouth See admin instructions. Take 0.5 mg by mouth in the morning and 1 mg at bedtime   losartan 25 MG tablet Commonly known as: COZAAR Take 25 mg by mouth daily.   meclizine 25 MG tablet Commonly known as: ANTIVERT Take 1 tablet (25 mg total) by mouth 3 (three) times daily as needed for dizziness.   metFORMIN 500 MG 24 hr tablet  Commonly known as: GLUCOPHAGE-XR Take 2 tablets (1,000 mg total) by mouth daily with breakfast.   Methocarbamol 1000 MG Tabs Take 1,000 mg by mouth every 6 (six) hours as needed for muscle spasms.   metoprolol tartrate 50 MG tablet Commonly known as: LOPRESSOR Take 1 tablet (50 mg total) by mouth 2 (two) times daily.   naloxone 4 MG/0.1ML Liqd nasal spray kit Commonly known as: NARCAN Place 1 spray into the nose once as needed (AS DIRECTED).   nitrofurantoin (macrocrystal-monohydrate) 100 MG capsule Commonly known as: Macrobid Take 1 capsule (100 mg total) by mouth 2 (two) times daily. Started by: Erven Colla   nystatin powder Commonly known as: MYCOSTATIN/NYSTOP Apply 1 Application topically as needed (rash).   nystatin ointment Commonly known as: MYCOSTATIN Apply 1 Application topically 2 (two) times daily as needed (for rashes).   omeprazole 40 MG capsule Commonly known as: PRILOSEC Take 40 mg by mouth daily.   Omnipod 5 G7  Pods (Gen 5) Misc 1 Device by Does not apply route every other day.   Omnipod 5 G7 Intro (Gen 5) Kit 1 Device by Does not apply route every other day.   ondansetron 4 MG tablet Commonly known as: Zofran Take 1 tablet (4 mg total) by mouth every 8 (eight) hours as needed for nausea or vomiting. For cancer related nausea   OXcarbazepine 150 MG tablet Commonly known as: TRILEPTAL Take 1 tablet (150 mg total) by mouth 2 (two) times daily.   Oxycodone HCl 20 MG Tabs Take 1 tablet (20 mg total) by mouth 2 (two) times daily as needed. Do Not Fill Before 08/09/2023   pantoprazole 40 MG tablet Commonly known as: PROTONIX Take 40 mg by mouth daily as needed (for reflux).   potassium chloride 10 MEQ tablet Commonly known as: KLOR-CON Take 1 tablet (10 mEq total) by mouth daily.   predniSONE 20 MG tablet Commonly known as: DELTASONE Take 1 tablet (20 mg total) by mouth 2 (two) times daily with a meal.   pregabalin 200 MG capsule Commonly known as: LYRICA Take 200 mg by mouth 2 (two) times daily.   Prolia 60 MG/ML Sosy injection Generic drug: denosumab Inject 60 mg into the skin every 6 (six) months.   promethazine 25 MG tablet Commonly known as: PHENERGAN Take 0.5-1 tablets (12.5-25 mg total) by mouth every 6 (six) hours as needed for refractory nausea / vomiting (cancer related nausea/vomiting).   promethazine-dextromethorphan 6.25-15 MG/5ML syrup Commonly known as: PROMETHAZINE-DM Take 5 mLs by mouth 4 (four) times daily as needed for cough.   rOPINIRole 1 MG tablet Commonly known as: REQUIP Take 1 tablet (1 mg total) by mouth at bedtime. For leg cramps   senna-docusate 8.6-50 MG tablet Commonly known as: Senokot-S Take 1 tablet by mouth at bedtime.   spironolactone 25 MG tablet Commonly known as: ALDACTONE Take 0.5 tablets (12.5 mg total) by mouth daily.   tirzepatide 10 MG/0.5ML Pen Commonly known as: MOUNJARO Inject 10 mg into the skin once a week.   TYLENOL 500  MG tablet Generic drug: acetaminophen Take 500-1,000 mg by mouth every 6 (six) hours as needed for mild pain (pain score 1-3) or headache.   Vitamin D (Cholecalciferol) 25 MCG (1000 UT) Tabs Take 1,000 Units by mouth daily in the afternoon.         Follow-up: Return in about 6 weeks (around 08/24/2023), or if symptoms worsen or fail to improve.  Mechele Claude, M.D.

## 2023-07-14 NOTE — Telephone Encounter (Signed)
Please let the patient know that I sent their prescription to their pharmacy. Thanks, WS

## 2023-07-14 NOTE — Telephone Encounter (Signed)
Actually she did tell me and I examined the back of her head. I noted it is tender, but no sign of neurologic change to warrant emergent scanning was elicited.

## 2023-07-14 NOTE — Telephone Encounter (Unsigned)
Copied from CRM [id number]. Topic: Clinical - Prescription Issue >> Jul 14, 2023 12:41 PM Antwanette L wrote: Reason for CRM: Patient is calling back regarding her prescription for Belsomra. Patient called her pharmacy but they have not received an order from Dr. Darlyn Read. Listed below is patient pharmacy  [facility] [address] [place] [address] Phone: [phone]  Fax: [phone]

## 2023-07-14 NOTE — Telephone Encounter (Signed)
Copied from CRM [id number]. Topic: Clinical - Home Health Verbal Orders >> Jul 14, 2023 10:42 AM Geroge Baseman wrote: Caller/Agency: [facility] Callback Number: [phone] Service Requested: N/A Frequency: N/A Any new concerns about the patient? Yes, she states the patient informed the doctors of the fall but not inform them that she hit her head, it is very tender, and she said she has about 3-4 lumps on her head. Wanted to make note of this since the patient is on blood thinners.

## 2023-07-14 NOTE — Telephone Encounter (Signed)
Please advise

## 2023-07-15 ENCOUNTER — Other Ambulatory Visit: Payer: Self-pay | Admitting: *Deleted

## 2023-07-15 ENCOUNTER — Telehealth: Payer: Self-pay

## 2023-07-15 ENCOUNTER — Encounter: Payer: Self-pay | Admitting: *Deleted

## 2023-07-15 DIAGNOSIS — I110 Hypertensive heart disease with heart failure: Secondary | ICD-10-CM | POA: Diagnosis not present

## 2023-07-15 DIAGNOSIS — J324 Chronic pansinusitis: Secondary | ICD-10-CM | POA: Diagnosis not present

## 2023-07-15 DIAGNOSIS — E1142 Type 2 diabetes mellitus with diabetic polyneuropathy: Secondary | ICD-10-CM | POA: Diagnosis not present

## 2023-07-15 DIAGNOSIS — B974 Respiratory syncytial virus as the cause of diseases classified elsewhere: Secondary | ICD-10-CM | POA: Diagnosis not present

## 2023-07-15 DIAGNOSIS — J45901 Unspecified asthma with (acute) exacerbation: Secondary | ICD-10-CM | POA: Diagnosis not present

## 2023-07-15 DIAGNOSIS — I5032 Chronic diastolic (congestive) heart failure: Secondary | ICD-10-CM | POA: Diagnosis not present

## 2023-07-15 NOTE — Telephone Encounter (Unsigned)
Copied from CRM [id number]. Topic: Clinical - Prescription Issue >> Jul 15, 2023  2:58 PM Gildardo Pounds wrote: Reason for CRM: Patient is having an issue getting Suvorexant (BELSOMRA) 20 MG TABS prescription filled. Per patient, pharmacy advised the insurance company denied the prescription. The pre-authorization has been submitted so patient has no idea why they denied the prescription again. Callback number is [phone]

## 2023-07-15 NOTE — Telephone Encounter (Signed)
Pt aware script was sent to [facility]

## 2023-07-15 NOTE — Patient Instructions (Signed)
Visit Information  Thank you for taking time to visit with me today. Please don't hesitate to contact me if I can be of assistance to you before our next scheduled telephone appointment.  Following are the goals we discussed today:   Goals Addressed             This Visit's Progress    COMPLETED: Transition of Care/ pt will have no readmissions within 30 days       Current Barriers:  Home Health services [facility] continue to work with pt. Knowledge Deficits related to plan of care for management of RSV- pt is eating and drinking well. Patient reports she continues to get stronger daily, went to ED on 06/26/23 for chest pain and states " it was related to the pneumonia but is better now" , denies any chest pain since last conversation, pt reports she has finished prednisone and all antibiotics.  Patient reports that she is using 3 liters of oxygen at night and as needed during the day.  Patient states she was diagnosed with urinary tract infection by oncologist, pt is on antibiotic, no other concerns reported Patient checks CBG daily with today's reading 167,  AIC 05/14/24  6.8  RNCM Clinical Goal(s):  Patient will work with the Care Management team over the next 30 days to address Transition of Care Barriers: Home Health services verbalize understanding of plan for management of RSV as evidenced by pt report, review of EMR and  through collaboration with RN Care manager, provider, and care team.   Interventions: Evaluation of current treatment plan related to  self management and patient's adherence to plan as established by provider RSV  (Status:  Goal on track:  Yes.)  Short Term Goal Evaluation of current treatment plan related to  RSV ,  home health has not seen patient,   self-management and patient's adherence to plan as established by provider. Discussed plans with patient for ongoing care management follow up and provided patient with direct contact information for care  management team Reviewed all upcoming scheduled appointments including Dr. Roselind Messier on 07/09/23 Reinforced with patient importance of healthy diet. Reviewed with patient to continue to take her medications as prescribed and call MD for additional concerns. Reviewed safety precautions, importance of using cane Reviewed signs/ symptoms UTI and prevention Reviewed plan of care with patient including transfer to longitudinal care management, routed note to Danise Edge RN  Patient Goals/Self-Care Activities: Participate in Transition of Care Program/Attend [facility] scheduled calls Notify RN Care Manager of TOC call rescheduling needs Take all medications as prescribed Attend all scheduled provider appointments Call pharmacy for medication refills 3-7 days in advance of running out of medications Perform all self care activities independently  Call provider office for new concerns or questions  Please contact your doctor for any unresolved/ worsening symptoms- cough, wheezing, fever Call 911 for shortness of breath, chest pain Use oxygen as prescribed Please drink adequate fluids, preferably water Please use your cane, keep pathways clear, do not get in a hurry and ask for assistance as needed Case closure for transition of care program, you have been transferred to case manager at your primary care provider office  Follow Up Plan:  Telephone follow up appointment with care management team member scheduled for:  08/13/23 @ 230 pm with Danise Edge           Our next appointment is by telephone on 08/13/23 at 230 pm  Please call the care guide team at  [phone] if you need to cancel or reschedule your appointment.   If you are experiencing a Mental Health or Behavioral Health Crisis or need someone to talk to, please call the Suicide and Crisis Lifeline: 988 call the [place] National Suicide Prevention Lifeline: 623 121 8633 or TTY: 478 460 7318 TTY [phone]) to talk to a trained  counselor call 1-800-273-TALK (toll free, 24 hour hotline) go to [facility] [address] [place] [phone]) call the [facility] Crisis Line: [phone] call 911   Patient verbalizes understanding of instructions and care plan provided today and agrees to view in MyChart. Active MyChart status and patient understanding of how to access instructions and care plan via MyChart confirmed with patient.     Irving Shows [facility], BSN RN Care Manager/ Transition of Care [place]/ [facility] [phone]

## 2023-07-15 NOTE — Telephone Encounter (Signed)
Pt aware.

## 2023-07-15 NOTE — Telephone Encounter (Signed)
Copied from CRM [id number]. Topic: Clinical - Home Health Verbal Orders >> Jul 15, 2023  3:43 PM Geroge Baseman wrote: Caller/Agency: [facility] Callback Number: [phone] Service Requested: Occupational Therapy Frequency: completed Any new concerns about the patient? No, she will be an evaluation only. Dressing and everything on her own. Still having problems with balance but physical therapy is still working with her on that.

## 2023-07-15 NOTE — Telephone Encounter (Signed)
Duplicate encounter, pt aware

## 2023-07-15 NOTE — Patient Outreach (Signed)
Care Management  Transitions of Care Program Transitions of Care Post-discharge week 5   07/15/2023 Name: Amber Stephenson MRN: [id number] DOB: 13-Aug-1945  Subjective: Amber Stephenson is a 78 y.o. year old female who is a primary care patient of Stacks, Broadus John, MD. The Care Management team Engaged with patient Engaged with patient by telephone to assess and address transitions of care needs.   Consent to Services:  Patient was given information about care management services, agreed to services, and gave verbal consent to participate.   Assessment: Patient states overall doing well, diagnosed with UTI by oncologist and taking antibiotic as prescribed, checks CBG daily, AIC 6.8 on 06/15/23, pt reports she has all medications and taking as prescribed, pt requests longitudinal care management.         SDOH Interventions    Flowsheet Row Telephone from 06/16/2023 in [place] [facility]PARTMENT Telephone from 02/03/2023 in [facility] Coordination Clinical Support from 01/23/2023 in [facility] Telephone from 01/22/2023 in [facility] Coordination Telephone from 11/13/2022 in [facility] Telephone from 09/05/2022 in [facility]  SDOH Interventions        Food Insecurity Interventions Intervention Not Indicated Intervention Not Indicated Intervention Not Indicated Intervention Not Indicated Intervention Not Indicated Intervention Not Indicated  Housing Interventions Intervention Not Indicated -- Intervention Not Indicated -- Intervention Not Indicated Intervention Not Indicated  Transportation Interventions Intervention Not Indicated Intervention Not Indicated Intervention Not Indicated Intervention Not Indicated Intervention Not Indicated, Patient Resources Dietitian) --  Utilities Interventions Intervention Not  Indicated Intervention Not Indicated Intervention Not Indicated -- -- --  Alcohol Usage Interventions -- -- Intervention Not Indicated (Score <7) -- -- --  Financial Strain Interventions -- -- Intervention Not Indicated -- -- --  Stress Interventions -- -- Intervention Not Indicated -- -- --  Social Connections Interventions -- -- Intervention Not Indicated -- -- --  Health Literacy Interventions -- -- Intervention Not Indicated -- -- --        Goals Addressed             This Visit's Progress    COMPLETED: Transition of Care/ pt will have no readmissions within 30 days       Current Barriers:  Home Health services [facility] continue to work with pt. Knowledge Deficits related to plan of care for management of RSV- pt is eating and drinking well. Patient reports she continues to get stronger daily, went to ED on 06/26/23 for chest pain and states " it was related to the pneumonia but is better now" , denies any chest pain since last conversation, pt reports she has finished prednisone and all antibiotics.  Patient reports that she is using 3 liters of oxygen at night and as needed during the day.  Patient states she was diagnosed with urinary tract infection by oncologist, pt is on antibiotic, no other concerns reported Patient checks CBG daily with today's reading 167,  AIC 05/14/24  6.8  RNCM Clinical Goal(s):  Patient will work with the Care Management team over the next 30 days to address Transition of Care Barriers: Home Health services verbalize understanding of plan for management of RSV as evidenced by pt report, review of EMR and  through collaboration with RN Care manager, provider, and care team.   Interventions: Evaluation of current treatment plan related to  self management and patient's adherence to plan as established  by provider RSV  (Status:  Goal on track:  Yes.)  Short Term Goal Evaluation of current treatment plan related to  RSV ,  home health has not  seen patient,   self-management and patient's adherence to plan as established by provider. Discussed plans with patient for ongoing care management follow up and provided patient with direct contact information for care management team Reviewed all upcoming scheduled appointments including Dr. Roselind Messier on 07/09/23 Reinforced with patient importance of healthy diet. Reviewed with patient to continue to take her medications as prescribed and call MD for additional concerns. Reviewed safety precautions, importance of using cane Reviewed signs/ symptoms UTI and prevention Reviewed plan of care with patient including transfer to longitudinal care management, routed note to Danise Edge RN  Patient Goals/Self-Care Activities: Participate in Transition of Care Program/Attend [facility] scheduled calls Notify RN Care Manager of TOC call rescheduling needs Take all medications as prescribed Attend all scheduled provider appointments Call pharmacy for medication refills 3-7 days in advance of running out of medications Perform all self care activities independently  Call provider office for new concerns or questions  Please contact your doctor for any unresolved/ worsening symptoms- cough, wheezing, fever Call 911 for shortness of breath, chest pain Use oxygen as prescribed Please drink adequate fluids, preferably water Please use your cane, keep pathways clear, do not get in a hurry and ask for assistance as needed Case closure for transition of care program, you have been transferred to case manager at your primary care provider office  Follow Up Plan:  Telephone follow up appointment with care management team member scheduled for:  08/13/23 @ 230 pm with Danise Edge          Plan: Telephone follow up appointment with care management team member scheduled for: 08/13/23 @ 230 pm with  Larey Brick RN Care Manager  Irving Shows [facility], BSN RN Care Manager/ Transition of Care [place] [phone]

## 2023-07-16 ENCOUNTER — Telehealth: Payer: Self-pay

## 2023-07-16 ENCOUNTER — Other Ambulatory Visit: Payer: Self-pay | Admitting: Internal Medicine

## 2023-07-16 ENCOUNTER — Other Ambulatory Visit (HOSPITAL_COMMUNITY): Payer: Self-pay

## 2023-07-16 DIAGNOSIS — J324 Chronic pansinusitis: Secondary | ICD-10-CM | POA: Diagnosis not present

## 2023-07-16 DIAGNOSIS — I5032 Chronic diastolic (congestive) heart failure: Secondary | ICD-10-CM | POA: Diagnosis not present

## 2023-07-16 DIAGNOSIS — E1142 Type 2 diabetes mellitus with diabetic polyneuropathy: Secondary | ICD-10-CM | POA: Diagnosis not present

## 2023-07-16 DIAGNOSIS — B974 Respiratory syncytial virus as the cause of diseases classified elsewhere: Secondary | ICD-10-CM | POA: Diagnosis not present

## 2023-07-16 DIAGNOSIS — I110 Hypertensive heart disease with heart failure: Secondary | ICD-10-CM | POA: Diagnosis not present

## 2023-07-16 DIAGNOSIS — J45901 Unspecified asthma with (acute) exacerbation: Secondary | ICD-10-CM | POA: Diagnosis not present

## 2023-07-16 NOTE — Telephone Encounter (Signed)
PA request has been Approved. New Encounter created for follow up. For additional info see Pharmacy Prior Auth telephone encounter from 07/16/23.

## 2023-07-16 NOTE — Telephone Encounter (Signed)
Pharmacy Patient Advocate Encounter   Received notification from Pt Calls Messages that prior authorization for Belsomra 20MG  tablets is required/requested.   Insurance verification completed.   The patient is insured through [employer] .   Per test claim: PA required and submitted KEY/EOC/Request #: [id number] APPROVED from 07/16/23 to 07/15/24. Ran test claim, Copay is $43. This test claim was processed through [facility]- copay amounts may vary at other pharmacies due to pharmacy/plan contracts, or as the patient moves through the different stages of their insurance plan.   PA Case ID #: [id number]

## 2023-07-17 ENCOUNTER — Encounter: Payer: Self-pay | Admitting: Family Medicine

## 2023-07-19 DIAGNOSIS — J324 Chronic pansinusitis: Secondary | ICD-10-CM | POA: Diagnosis not present

## 2023-07-19 DIAGNOSIS — M7918 Myalgia, other site: Secondary | ICD-10-CM | POA: Diagnosis not present

## 2023-07-19 DIAGNOSIS — G250 Essential tremor: Secondary | ICD-10-CM | POA: Diagnosis not present

## 2023-07-19 DIAGNOSIS — M5186 Other intervertebral disc disorders, lumbar region: Secondary | ICD-10-CM | POA: Diagnosis not present

## 2023-07-19 DIAGNOSIS — G894 Chronic pain syndrome: Secondary | ICD-10-CM | POA: Diagnosis not present

## 2023-07-19 DIAGNOSIS — D179 Benign lipomatous neoplasm, unspecified: Secondary | ICD-10-CM | POA: Diagnosis not present

## 2023-07-19 DIAGNOSIS — E278 Other specified disorders of adrenal gland: Secondary | ICD-10-CM | POA: Diagnosis not present

## 2023-07-19 DIAGNOSIS — I5032 Chronic diastolic (congestive) heart failure: Secondary | ICD-10-CM | POA: Diagnosis not present

## 2023-07-19 DIAGNOSIS — J9611 Chronic respiratory failure with hypoxia: Secondary | ICD-10-CM | POA: Diagnosis not present

## 2023-07-19 DIAGNOSIS — I4891 Unspecified atrial fibrillation: Secondary | ICD-10-CM | POA: Diagnosis not present

## 2023-07-19 DIAGNOSIS — M8580 Other specified disorders of bone density and structure, unspecified site: Secondary | ICD-10-CM | POA: Diagnosis not present

## 2023-07-19 DIAGNOSIS — F319 Bipolar disorder, unspecified: Secondary | ICD-10-CM | POA: Diagnosis not present

## 2023-07-19 DIAGNOSIS — I110 Hypertensive heart disease with heart failure: Secondary | ICD-10-CM | POA: Diagnosis not present

## 2023-07-19 DIAGNOSIS — I272 Pulmonary hypertension, unspecified: Secondary | ICD-10-CM | POA: Diagnosis not present

## 2023-07-19 DIAGNOSIS — B974 Respiratory syncytial virus as the cause of diseases classified elsewhere: Secondary | ICD-10-CM | POA: Diagnosis not present

## 2023-07-19 DIAGNOSIS — G4733 Obstructive sleep apnea (adult) (pediatric): Secondary | ICD-10-CM | POA: Diagnosis not present

## 2023-07-19 DIAGNOSIS — E1142 Type 2 diabetes mellitus with diabetic polyneuropathy: Secondary | ICD-10-CM | POA: Diagnosis not present

## 2023-07-19 DIAGNOSIS — E871 Hypo-osmolality and hyponatremia: Secondary | ICD-10-CM | POA: Diagnosis not present

## 2023-07-19 DIAGNOSIS — E785 Hyperlipidemia, unspecified: Secondary | ICD-10-CM | POA: Diagnosis not present

## 2023-07-19 DIAGNOSIS — D630 Anemia in neoplastic disease: Secondary | ICD-10-CM | POA: Diagnosis not present

## 2023-07-19 DIAGNOSIS — M5184 Other intervertebral disc disorders, thoracic region: Secondary | ICD-10-CM | POA: Diagnosis not present

## 2023-07-19 DIAGNOSIS — I2510 Atherosclerotic heart disease of native coronary artery without angina pectoris: Secondary | ICD-10-CM | POA: Diagnosis not present

## 2023-07-19 DIAGNOSIS — I700 Atherosclerosis of aorta: Secondary | ICD-10-CM | POA: Diagnosis not present

## 2023-07-19 DIAGNOSIS — J45901 Unspecified asthma with (acute) exacerbation: Secondary | ICD-10-CM | POA: Diagnosis not present

## 2023-07-19 DIAGNOSIS — M461 Sacroiliitis, not elsewhere classified: Secondary | ICD-10-CM | POA: Diagnosis not present

## 2023-07-20 ENCOUNTER — Other Ambulatory Visit: Payer: Self-pay

## 2023-07-20 MED ORDER — DEXCOM G7 SENSOR MISC: 3 refills | Status: DC

## 2023-07-22 ENCOUNTER — Other Ambulatory Visit: Payer: Self-pay | Admitting: Physician Assistant

## 2023-07-22 DIAGNOSIS — J45901 Unspecified asthma with (acute) exacerbation: Secondary | ICD-10-CM | POA: Diagnosis not present

## 2023-07-22 DIAGNOSIS — E1142 Type 2 diabetes mellitus with diabetic polyneuropathy: Secondary | ICD-10-CM | POA: Diagnosis not present

## 2023-07-22 DIAGNOSIS — B974 Respiratory syncytial virus as the cause of diseases classified elsewhere: Secondary | ICD-10-CM | POA: Diagnosis not present

## 2023-07-22 DIAGNOSIS — J324 Chronic pansinusitis: Secondary | ICD-10-CM | POA: Diagnosis not present

## 2023-07-22 DIAGNOSIS — I110 Hypertensive heart disease with heart failure: Secondary | ICD-10-CM | POA: Diagnosis not present

## 2023-07-22 DIAGNOSIS — I5032 Chronic diastolic (congestive) heart failure: Secondary | ICD-10-CM | POA: Diagnosis not present

## 2023-07-27 ENCOUNTER — Other Ambulatory Visit (HOSPITAL_COMMUNITY): Payer: Self-pay | Admitting: Family Medicine

## 2023-07-27 DIAGNOSIS — Z1231 Encounter for screening mammogram for malignant neoplasm of breast: Secondary | ICD-10-CM

## 2023-07-29 DIAGNOSIS — I110 Hypertensive heart disease with heart failure: Secondary | ICD-10-CM | POA: Diagnosis not present

## 2023-07-29 DIAGNOSIS — B974 Respiratory syncytial virus as the cause of diseases classified elsewhere: Secondary | ICD-10-CM | POA: Diagnosis not present

## 2023-07-29 DIAGNOSIS — I5032 Chronic diastolic (congestive) heart failure: Secondary | ICD-10-CM | POA: Diagnosis not present

## 2023-07-29 DIAGNOSIS — J45901 Unspecified asthma with (acute) exacerbation: Secondary | ICD-10-CM | POA: Diagnosis not present

## 2023-07-29 DIAGNOSIS — E1142 Type 2 diabetes mellitus with diabetic polyneuropathy: Secondary | ICD-10-CM | POA: Diagnosis not present

## 2023-07-29 DIAGNOSIS — J324 Chronic pansinusitis: Secondary | ICD-10-CM | POA: Diagnosis not present

## 2023-07-30 DIAGNOSIS — I110 Hypertensive heart disease with heart failure: Secondary | ICD-10-CM | POA: Diagnosis not present

## 2023-07-30 DIAGNOSIS — J45901 Unspecified asthma with (acute) exacerbation: Secondary | ICD-10-CM | POA: Diagnosis not present

## 2023-07-30 DIAGNOSIS — I5032 Chronic diastolic (congestive) heart failure: Secondary | ICD-10-CM | POA: Diagnosis not present

## 2023-07-30 DIAGNOSIS — E1142 Type 2 diabetes mellitus with diabetic polyneuropathy: Secondary | ICD-10-CM | POA: Diagnosis not present

## 2023-07-30 DIAGNOSIS — J324 Chronic pansinusitis: Secondary | ICD-10-CM | POA: Diagnosis not present

## 2023-07-30 DIAGNOSIS — B974 Respiratory syncytial virus as the cause of diseases classified elsewhere: Secondary | ICD-10-CM | POA: Diagnosis not present

## 2023-08-03 ENCOUNTER — Ambulatory Visit: Payer: Self-pay | Admitting: Family Medicine

## 2023-08-03 NOTE — Telephone Encounter (Signed)
  Chief Complaint: recurrent UTI, worsened after antibiotics Symptoms: urinary frequency, urgency and bilateral flank pain, lack of appetite, slight yellow vaginal discharge Frequency: x1 month and worsening Pertinent Negatives: Patient denies painful urination, fevers, blood in urine, nausea, vomiting. Disposition: [] ED /[] Urgent Care (no appt availability in office) / [x] Appointment(In office/virtual)/ []  [place] [facility]/ [] Home Care/ [] Refused Recommended Disposition /[] [place] [facility]/ []  Follow-up with PCP Additional Notes: Offered patient acute visits this afternoon;  she has a scheduling conflict today and can not come in this afternoon. Patient agreeable to first available appointment tomorrow at PCP office and verbalizes understanding to go to urgent care or ED for any worsening of symptoms.  Copied from CRM [id number]. Topic: Appointments - Appointment Scheduling >> Aug 03, 2023 12:39 PM Kristie Cowman wrote: The patient has had a worsening UTI for one month.  The patient says she has been on four different antibiotics and nothing is helping.  She would like to schedule an office visit. Reason for Disposition  [1] Side (flank) or lower back pain AND [2] new-onset since starting antibiotics  Answer Assessment - Initial Assessment Questions 1. MAIN SYMPTOM: "What is the main symptom you are concerned about?" (e.g., painful urination, urine frequency)     Frequency and urgency.  2. BETTER-SAME-WORSE: "Are you getting better, staying the same, or getting worse compared to how you felt at your last visit to the doctor (most recent medical visit)?"    Getting worse.  3. PAIN: "How bad is the pain?"  (e.g., Scale 1-10; mild, moderate, or severe)   - MILD (1-3): complains slightly about urination hurting   - MODERATE (4-7): interferes with normal activities     - SEVERE (8-10): excruciating, unwilling or unable to urinate because of the pain      0/10.  4. FEVER: "Do you have a  fever?" If Yes, ask: "What is it, how was it measured, and when did it start?"     Denies.  5. OTHER SYMPTOMS: "Do you have any other symptoms?" (e.g., blood in the urine, flank pain, vaginal discharge)     Flank pain bilateral, lack of appetite, "little bit" of yellow vaginal discharge.  6. DIAGNOSIS: "When was the UTI diagnosed?" "By whom?" "Was it a kidney infection, bladder infection or both?"      Unsure when it was first diagnosed, office visit on 07/09/23 with Dr Roselind Messier and was placed on an antibiotic 7. ANTIBIOTIC: "What antibiotic(s) are you taking?" "How many times per day?"     Macrobid bid. 8. ANTIBIOTIC - START DATE: "When did you start taking the antibiotic?"     07/13/23.  Protocols used: Urinary Tract Infection on Antibiotic Follow-up Call - [facility]

## 2023-08-04 ENCOUNTER — Ambulatory Visit (INDEPENDENT_AMBULATORY_CARE_PROVIDER_SITE_OTHER): Admitting: Nurse Practitioner

## 2023-08-04 ENCOUNTER — Encounter: Payer: Self-pay | Admitting: Nurse Practitioner

## 2023-08-04 VITALS — BP 119/77 | HR 100 | Temp 97.2°F | Ht 62.0 in | Wt 182.0 lb

## 2023-08-04 DIAGNOSIS — B351 Tinea unguium: Secondary | ICD-10-CM | POA: Diagnosis not present

## 2023-08-04 DIAGNOSIS — Z87440 Personal history of urinary (tract) infections: Secondary | ICD-10-CM

## 2023-08-04 DIAGNOSIS — R82998 Other abnormal findings in urine: Secondary | ICD-10-CM | POA: Diagnosis not present

## 2023-08-04 DIAGNOSIS — M79676 Pain in unspecified toe(s): Secondary | ICD-10-CM | POA: Diagnosis not present

## 2023-08-04 DIAGNOSIS — E1142 Type 2 diabetes mellitus with diabetic polyneuropathy: Secondary | ICD-10-CM | POA: Diagnosis not present

## 2023-08-04 DIAGNOSIS — L84 Corns and callosities: Secondary | ICD-10-CM | POA: Diagnosis not present

## 2023-08-04 LAB — MICROSCOPIC EXAMINATION
RBC, Urine: NONE SEEN /HPF (ref 0–2)
Renal Epithel, UA: NONE SEEN /HPF
Yeast, UA: NONE SEEN

## 2023-08-04 LAB — URINALYSIS, COMPLETE
Bilirubin, UA: NEGATIVE
Glucose, UA: NEGATIVE
Ketones, UA: NEGATIVE
Nitrite, UA: NEGATIVE
RBC, UA: NEGATIVE
Specific Gravity, UA: 1.015 (ref 1.005–1.030)
Urobilinogen, Ur: 0.2 mg/dL (ref 0.2–1.0)
pH, UA: 5.5 (ref 5.0–7.5)

## 2023-08-04 MED ORDER — AMOXICILLIN-POT CLAVULANATE 875-125 MG PO TABS: 1.0000 | ORAL_TABLET | Freq: Two times a day (BID) | ORAL | 0 refills | Status: DC

## 2023-08-04 NOTE — Progress Notes (Signed)
 Subjective:    Patient ID: Amber Stephenson, female    DOB: 1946/02/25, 78 y.o.   MRN: [id number]   Chief Complaint: Recent urinary tract infection (Not getting any better/)   Patient in c/o dysuria for over a month. Has been treated for UTI 2 x. Was given macrobid last time.  Urinary Tract Infection  This is a new problem. The problem occurs intermittently. The problem has been waxing and waning. The pain is moderate. She is Sexually active. Associated symptoms include frequency, hesitancy and urgency.    Patient Active Problem List   Diagnosis Date Noted   Hyponatremia 06/10/2023   RSV (respiratory syncytial virus infection) 06/10/2023   Abdominal pain 06/10/2023   Diarrhea 06/10/2023   Candidiasis of mouth 05/15/2023   Nausea and vomiting 02/16/2023   Type 2 diabetes mellitus with hyperglycemia, with long-term current use of insulin (HCC) 02/12/2023   Long term (current) use of oral hypoglycemic drugs 02/12/2023   Acute heart failure (HCC) 01/28/2023   Pure hypercholesterolemia 01/14/2023   Primary hypertension 01/14/2023   Metabolic acidosis 01/12/2023   Endometrial cancer (HCC) 01/12/2023   Acute on chronic respiratory failure with hypoxia (HCC) 01/12/2023   Vulvar itching 12/22/2022   PMB (postmenopausal bleeding) 11/13/2022   Superficial fungus infection of skin 11/13/2022   Burning with urination 11/13/2022   UTI (urinary tract infection) 11/09/2022   Vitamin B12 deficiency 11/09/2022   Scalp hematoma, initial encounter 11/09/2022   Frequent falls 09/01/2022   Hypotension 09/01/2022   Renal insufficiency 08/08/2022   AKI (acute kidney injury) (HCC) 06/27/2022   Hemorrhoids 03/11/2022   Rectal itching 03/11/2022   Microalbuminuria 01/21/2022   Tremor, essential 12/11/2021   Chronic bilateral low back pain with right-sided sciatica 07/19/2021   Vaginal itching 04/16/2021   Sensory ataxia 03/28/2021   Acute diastolic CHF (congestive heart failure) (HCC)  12/16/2020   Elevated troponin 12/14/2020   Chronic post-traumatic stress disorder (PTSD) 12/06/2020   Senile nuclear sclerosis 12/06/2020   Senile corneal changes 12/06/2020   Postmenopausal bleeding 12/06/2020   Acquired hammer toe 12/06/2020   Opioid dependence 12/06/2020   Hypermetropia 12/06/2020   Generalized weakness 12/06/2020   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   Generalized anxiety disorder 12/06/2020   Functional diarrhea 10/26/2020   Insulin dependent type 2 diabetes mellitus (HCC) 09/24/2020   Chronic respiratory failure with hypoxia (HCC) 04/25/2020   Chronic constipation 04/25/2020   CAD (coronary artery disease)    Hypocalcemia    S/P shoulder replacement, 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   Mixed hyperlipidemia 05/20/2019   Vertigo 04/25/2019   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   OSA (obstructive sleep apnea) 02/24/2019   Acute on chronic hypoxic respiratory failure (HCC) 01/22/2019   Acute pulmonary edema (HCC) 01/21/2019   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   At risk for adverse drug event 07/06/2018   Non-alcoholic fatty liver disease 01/12/2018   Mild intermittent asthma 01/12/2018   Senile osteoporosis 12/15/2017   Atrial fibrillation with rapid ventricular response (HCC) 05/19/2017   Migraine headache with aura 02/12/2016   Diabetic neuropathy (HCC) 02/06/2016   Bipolar 1 disorder (HCC) 10/04/2015   Major depressive disorder 10/03/2015   Herpes genitalis in women 07/16/2015   Long term current use of anticoagulant 05/30/2015   Chronic heart failure with preserved ejection fraction (HFpEF) (HCC) 05/30/2015   Essential hypertension    RLS (restless legs syndrome) 04/27/2015   Insomnia 01/23/2015   Chronic atrial fibrillation with RVR (HCC) 01/04/2015       Review of Systems  Genitourinary:  Positive for frequency, hesitancy and urgency.       Objective:   Physical Exam Constitutional:      Appearance: Normal appearance.  Cardiovascular:      Rate and Rhythm: Normal rate and regular rhythm.     Heart sounds: Normal heart sounds.  Pulmonary:     Effort: Pulmonary effort is normal.     Breath sounds: Normal breath sounds.  Abdominal:     Tenderness: There is abdominal tenderness (mild suprapubic tenderness). There is no right CVA tenderness or left CVA tenderness.  Neurological:     General: No focal deficit present.     Mental Status: She is alert and oriented to person, place, and time.  Psychiatric:        Mood and Affect: Mood normal.        Behavior: Behavior normal.     BP 119/77   Pulse 100   Temp (!) 97.2 F (36.2 C) (Temporal)   Ht 5\' 2"  (1.575 m)   Wt 182 lb (82.6 kg)   SpO2 96%   BMI 33.29 kg/m        Assessment & Plan:   Amber Stephenson in today with chief complaint of Recent urinary tract infection (Not getting any better/)   1. Recent urinary tract infection (Primary) Urinalysis showed trace of leuks- nitrites negative Will treat till culture comes back since so symptomatic. - Urinalysis, Complete - Urine Culture  Meds ordered this encounter  Medications   amoxicillin-clavulanate (AUGMENTIN) 875-125 MG tablet    Sig: Take 1 tablet by mouth 2 (two) times daily.    Dispense:  14 tablet    Refill:  0    Supervising Provider:   Arville Care A [[id number]]      The above assessment and management plan was discussed with the patient. The patient verbalized understanding of and has agreed to the management plan. Patient is aware to call the clinic if symptoms persist or worsen. Patient is aware when to return to the clinic for a follow-up visit. Patient educated on when it is appropriate to go to the emergency department.   Amber Daphine Deutscher, FNP

## 2023-08-04 NOTE — Patient Instructions (Signed)
 Urinary Tract Infection, Female A urinary tract infection (UTI) is an infection in your urinary tract. The urinary tract is made up of organs that make, store, and get rid of pee (urine) in your body. These organs include: The kidneys. The ureters. The bladder. The urethra. What are the causes? Most UTIs are caused by germs called bacteria. They may be in or near your genitals. These germs grow and cause swelling in your urinary tract. What increases the risk? You're more likely to get a UTI if: You're a female. The urethra is shorter in females than in males. You have a soft tube called a catheter that drains your pee. You can't control when you pee or poop. You have trouble peeing because of: A kidney stone. A urinary blockage. A nerve condition that affects your bladder. Not getting enough to drink. You're sexually active. You use a birth control inside your vagina, like spermicide. You're pregnant. You have low levels of the hormone estrogen in your body. You're an older adult. You're also more likely to get a UTI if you have other health problems. These may include: Diabetes. A weak immune system. Your immune system is your body's defense system. Sickle cell disease. Injury of the spine. What are the signs or symptoms? Symptoms may include: Needing to pee right away. Peeing small amounts often. Pain or burning when you pee. Blood in your pee. Pee that smells bad or odd. Pain in your belly or lower back. You may also: Feel confused. This may be the first symptom in older adults. Vomit. Not feel hungry. Feel tired or easily annoyed. Have a fever or chills. How is this diagnosed? A UTI is diagnosed based on your medical history and an exam. You may also have other tests. These may include: Pee tests. Blood tests. Tests for sexually transmitted infections (STIs). If you've had more than one UTI, you may need to have imaging studies done to find out why you keep getting  them. How is this treated? A UTI can be treated by: Taking antibiotics or other medicines. Drinking enough fluid to keep your pee pale yellow. In rare cases, a UTI can cause a very bad condition called sepsis. Sepsis may be treated in the hospital. Follow these instructions at home: Medicines Take your medicines only as told by your health care provider. If you were given antibiotics, take them as told by your provider. Do not stop taking them even if you start to feel better. General instructions Make sure you: Pee often and fully. Do not hold your pee for a long time. Wipe from front to back after you pee or poop. Use each tissue only once when you wipe. Pee after you have sex. Do not douche or use sprays or powders in your genital area. Contact a health care provider if: Your symptoms don't get better after 1-2 days of taking antibiotics. Your symptoms go away and then come back. You have a fever or chills. You vomit or feel like you may vomit. Get help right away if: You have very bad pain in your back or lower belly. You faint. This information is not intended to replace advice given to you by your health care provider. Make sure you discuss any questions you have with your health care provider. Document Revised: 12/25/2022 Document Reviewed: 08/22/2022 Elsevier Patient Education  2024 [employer].

## 2023-08-05 ENCOUNTER — Ambulatory Visit (HOSPITAL_COMMUNITY)
Admission: RE | Admit: 2023-08-05 | Discharge: 2023-08-05 | Disposition: A | Discharge status: Home / Self Care | Payer: Medicare Other | Source: Ambulatory Visit | Attending: Family Medicine | Admitting: Family Medicine

## 2023-08-05 DIAGNOSIS — Z1231 Encounter for screening mammogram for malignant neoplasm of breast: Secondary | ICD-10-CM | POA: Insufficient documentation

## 2023-08-06 DIAGNOSIS — I110 Hypertensive heart disease with heart failure: Secondary | ICD-10-CM | POA: Diagnosis not present

## 2023-08-06 DIAGNOSIS — B974 Respiratory syncytial virus as the cause of diseases classified elsewhere: Secondary | ICD-10-CM | POA: Diagnosis not present

## 2023-08-06 DIAGNOSIS — I5032 Chronic diastolic (congestive) heart failure: Secondary | ICD-10-CM | POA: Diagnosis not present

## 2023-08-06 DIAGNOSIS — J324 Chronic pansinusitis: Secondary | ICD-10-CM | POA: Diagnosis not present

## 2023-08-06 DIAGNOSIS — E1142 Type 2 diabetes mellitus with diabetic polyneuropathy: Secondary | ICD-10-CM | POA: Diagnosis not present

## 2023-08-06 DIAGNOSIS — J45901 Unspecified asthma with (acute) exacerbation: Secondary | ICD-10-CM | POA: Diagnosis not present

## 2023-08-07 LAB — URINE CULTURE

## 2023-08-11 ENCOUNTER — Other Ambulatory Visit: Payer: Self-pay | Admitting: Family Medicine

## 2023-08-11 DIAGNOSIS — F5101 Primary insomnia: Secondary | ICD-10-CM

## 2023-08-11 NOTE — Telephone Encounter (Unsigned)
 Copied from CRM [id number]. Topic: Clinical - Medication Refill >> Aug 11, 2023 11:43 AM Higinio Roger wrote: Most Recent Primary Care Visit:   Medication: Suvorexant (BELSOMRA) 20 MG TABS   Has the patient contacted their pharmacy? Yes (Agent: If no, request that the patient contact the pharmacy for the refill. If patient does not wish to contact the pharmacy document the reason why and proceed with request.) (Agent: If yes, when and what did the pharmacy advise?) Contact doctor  Is this the correct pharmacy for this prescription? Yes If no, delete pharmacy and type the correct one.  This is the patient's preferred pharmacy:   [facility] [phone] - [place], [place] - [address] AT [facility] [address] [place] [address] Phone: [phone] Fax: [phone]  Has the prescription been filled recently? Yes  Is the patient out of the medication? Yes  Has the patient been seen for an appointment in the last year OR does the patient have an upcoming appointment? Yes  Can we respond through MyChart? Yes  Agent: Please be advised that Rx refills may take up to 3 business days. We ask that you follow-up with your pharmacy.

## 2023-08-12 ENCOUNTER — Ambulatory Visit: Payer: Self-pay | Admitting: Family Medicine

## 2023-08-12 NOTE — Telephone Encounter (Signed)
  Chief Complaint: "restless leg syndrome pain" Symptoms: insomnia, generalized weakness, bilateral leg pain Frequency: constant pain and worsening x 1 week Pertinent Negatives: Patient denies redness, warmth, fever Disposition: [] ED /[] Urgent Care (no appt availability in office) / [x] Appointment(In office/virtual)/ []  [place] [facility]/ [] Home Care/ [] Refused Recommended Disposition /[] [place] [facility]/ []  Follow-up with PCP Additional Notes: Patient called in for triage complaining of worsening leg pain, she states she thinks it is her restless leg syndrome and she is having insomnia. She states she has been feeling generally weak. Asked patient if she is having any chest pain or SOB. Patient states at baseline she always has chest pain and SOB from her heart condition. She states it is not worse than baseline. 4-5 times a day chest pain (states they last just seconds) she states it is not new or worsened than the last time she spoke with Dr Darlyn Read, mild SOB/difficulty breathing she states she uses her oxygen during the day and at night.  Patient also checking up on Belsomra request, notified her PCP has 48-72 hours to address. Offered appointments to be seen this week, patient states she can not come in Thursday or Friday. Patient scheduled for Monday with FNP Daphine Deutscher. Advised patient to call back or go to urgent care/ED for any new or worsening of symptoms. Patient verbalizes understanding.  Copied From CRM [id number]. Patient is experiencing worsening pain she states she has restless leg syndrome  Reason for Disposition  Leg pain or muscle cramp is a chronic symptom (recurrent or ongoing AND present > 4 weeks)  Answer Assessment - Initial Assessment Questions 1. ONSET: "When did the pain start?"      Constant and worsening x 1 week.  2. LOCATION: "Where is the pain located?"      From feet to hips, bilateral.  3. PAIN: "How bad is the pain?"    (Scale 1-10; or mild, moderate,  severe)   -  MILD (1-3): doesn't interfere with normal activities    -  MODERATE (4-7): interferes with normal activities (e.g., work or school) or awakens from sleep, limping    -  SEVERE (8-10): excruciating pain, unable to do any normal activities, unable to walk     8/10, states she has taken oxycodone at 0600 this morning.  4. WORK OR EXERCISE: "Has there been any recent work or exercise that involved this part of the body?"      Denies.  5. CAUSE: "What do you think is causing the leg pain?"     Restless leg syndrome.  6. OTHER SYMPTOMS: "Do you have any other symptoms?" (e.g., chest pain, back pain, breathing difficulty, swelling, rash, fever, numbness, weakness)     Patient states at baseline she always has chest pain and SOB from her heart condition. She states it is not worse than baseline. 4-5 times a day chest pain (states they last just seconds) she states it is not new or worsened than the last time she spoke with Dr Darlyn Read, mild SOB/difficulty breathing she states she uses her oxygen during the day and at night.  Protocols used: Leg Pain-A-AH

## 2023-08-13 ENCOUNTER — Encounter: Payer: Self-pay | Admitting: *Deleted

## 2023-08-13 ENCOUNTER — Other Ambulatory Visit: Payer: Self-pay | Admitting: *Deleted

## 2023-08-13 ENCOUNTER — Other Ambulatory Visit: Payer: Self-pay | Admitting: Family Medicine

## 2023-08-13 MED ORDER — BELSOMRA 20 MG PO TABS: 20.0000 mg | ORAL_TABLET | Freq: Every evening | ORAL | 5 refills | Status: DC | PRN

## 2023-08-13 NOTE — Telephone Encounter (Signed)
 Please let the patient know that I sent their prescription to their pharmacy. Thanks, WS

## 2023-08-13 NOTE — Patient Outreach (Signed)
 Care Management   Visit Note  08/13/2023 Name: Amber Stephenson MRN: [id number] DOB: 1945-09-10  Subjective: Amber Stephenson is a 78 y.o. year old female who is a primary care patient of Mechele Claude, MD. The Care Management team was consulted for assistance.      Engaged with patient spoke with patient by telephone.    Goals Addressed             This Visit's Progress    RNCM Care Management Expected Outcomes: [place], Self-Manage and Reduce Symtpoms of: DM, UTI       Current Barriers:  Chronic Disease Management support and education needs related to CHF and DMII   RNCM Clinical Goal(s):  Patient will verbalize basic understanding of  CHF and DMII disease process and self health management plan as evidenced by verbal explanation, recognizing symptoms, lifestyle modifications attend all scheduled medical appointments: with primary care provider and specialist as evidenced by keeping all scheduled appointments demonstrate Improved and Ongoing adherence to prescribed treatment plan for CHF and DMII as evidenced by consistent medication compliance, symptom monitoring, continued lifestyle changes continue to work with RN Care Manager to address care management and care coordination needs related to  CHF and DMII as evidenced by adherence to CM Team Scheduled appointments through collaboration with RN Care manager, provider, and care team.   Interventions: Evaluation of current treatment plan related to  self management and patient's adherence to plan as established by provider   Diabetes Interventions:  (Status:  New goal.) Long Term Goal Assessed patient's understanding of A1c goal: <7% Provided education to patient about basic DM disease process. A1C below goal of 7. Patient uses Dexcom. Reports fasting glucose this morning 210. Highest: 210, Lowest: 105.  Reviewed medications with patient and discussed importance of medication adherence. Reports compliance with all  medications Counseled on importance of regular laboratory monitoring as prescribed. Labs up to date Discussed plans with patient for ongoing care management follow up and provided patient with direct contact information for care management team Provided patient with written educational materials related to hypo and hyperglycemia and importance of correct treatment. Denies any hypo/hyperglycemia Reviewed scheduled/upcoming provider appointments including: 08-17-2023 with PCP Advised patient, providing education and rationale, to check cbg before meals and bedtime and record, calling provider for findings outside established parameters. RNCM reviewed goal fasting <130 post prandial <180 Review of patient status, including review of consultants reports, relevant laboratory and other test results, and medications completed Screening for signs and symptoms of depression related to chronic disease state  Assessed social determinant of health barriers Eye exam scheduled for March 2025 Lab Results  Component Value Date   HGBA1C 6.8 (H) 06/15/2023    Heart Failure Interventions:  (Status:  New goal. and Goal on track:  Yes.) Long Term Goal Basic overview and discussion of pathophysiology of Heart Failure reviewed. Patient reports increase in shortness of breath has appointment on 08-17-2023 for follow up. She reports using 3L [place] during day and night as needed. Provided education on low sodium diet. Reports compliance. Reviewed Heart Failure Action Plan in depth and provided written copy Assessed need for readable accurate scales in home Provided education about placing scale on hard, flat surface Advised patient to weigh each morning after emptying bladder Discussed importance of daily weight and advised patient to weigh and record daily. Weighs daily. Reports weight of 180 lbs today. Reviewed role of diuretics in prevention of fluid overload and management of heart failure; Discussed the importance of  keeping all appointments with provider Provided patient with education about the role of exercise in the management of heart failure Advised patient to discuss increase in weight/shortness of breath with provider Screening for signs and symptoms of depression related to chronic disease state  Assessed social determinant of health barriers    Patient Goals/Self-Care Activities: Take all medications as prescribed Attend all scheduled provider appointments Call pharmacy for medication refills 3-7 days in advance of running out of medications Attend church or other social activities Perform all self care activities independently  Perform IADL's (shopping, preparing meals, housekeeping, managing finances) independently Call provider office for new concerns or questions  call office if I gain more than 2 pounds in one day or 5 pounds in one week watch for swelling in feet, ankles and legs every day eat more whole grains, fruits and vegetables, lean meats and healthy fats keep appointment with eye doctor check feet daily for cuts, sores or redness take the blood sugar log to all doctor visits  Follow Up Plan:  Telephone follow up appointment with care management team member scheduled for:  08-25-2023 at 3:45 pm           Consent to Services:  Patient was given information about care management services, agreed to services, and gave verbal consent to participate.   Plan: Telephone follow up appointment with care management team member scheduled for:08-25-2023 at 3:45 pm  Larey Brick, BSN RN [facility], [facility] RN Care Manager Direct Dial: [phone]  Fax: [phone]

## 2023-08-13 NOTE — Patient Instructions (Addendum)
 Care Management   Initial Visit Note  08/13/2023 Name: Amber Stephenson MRN: [id number] DOB: 1945/08/19  [name] is enrolled in a Managed Medicaid plan: No. Outreach attempt today was successful.   Subjective:   Objective:  Assessment: SANDER SPECKMAN is a 78 y.o. year old female who sees Stacks, Broadus John, MD for primary care. The care management team was consulted for assistance with care management and care coordination needs related to Disease Management.   Review of patient status, including review of consultants reports, relevant laboratory and other test results, and collaboration with appropriate care team members and the patient's provider was performed as part of comprehensive patient evaluation and provision of care management services.    SDOH (Social Determinants of Health) screening performed today. See Care Plan Entry related to challenges with: None   Goals Addressed             This Visit's Progress    RNCM Care Management Expected Outcomes: [place], Self-Manage and Reduce Symtpoms of: DM, UTI       Current Barriers:  Chronic Disease Management support and education needs related to CHF and DMII   RNCM Clinical Goal(s):  Patient will verbalize basic understanding of  CHF and DMII disease process and self health management plan as evidenced by verbal explanation, recognizing symptoms, lifestyle modifications attend all scheduled medical appointments: with primary care provider and specialist as evidenced by keeping all scheduled appointments demonstrate Improved and Ongoing adherence to prescribed treatment plan for CHF and DMII as evidenced by consistent medication compliance, symptom monitoring, continued lifestyle changes continue to work with RN Care Manager to address care management and care coordination needs related to  CHF and DMII as evidenced by adherence to CM Team Scheduled appointments through collaboration with RN Care manager, provider, and  care team.   Interventions: Evaluation of current treatment plan related to  self management and patient's adherence to plan as established by provider   Diabetes Interventions:  (Status:  New goal.) Long Term Goal Assessed patient's understanding of A1c goal: <7% Provided education to patient about basic DM disease process. A1C below goal of 7. Patient uses Dexcom. Reports fasting glucose this morning 210. Highest: 210, Lowest: 105.  Reviewed medications with patient and discussed importance of medication adherence. Reports compliance with all medications Counseled on importance of regular laboratory monitoring as prescribed. Labs up to date Discussed plans with patient for ongoing care management follow up and provided patient with direct contact information for care management team Provided patient with written educational materials related to hypo and hyperglycemia and importance of correct treatment. Denies any hypo/hyperglycemia Reviewed scheduled/upcoming provider appointments including: 08-17-2023 with PCP Advised patient, providing education and rationale, to check cbg before meals and bedtime and record, calling provider for findings outside established parameters. RNCM reviewed goal fasting <130 post prandial <180 Review of patient status, including review of consultants reports, relevant laboratory and other test results, and medications completed Screening for signs and symptoms of depression related to chronic disease state  Assessed social determinant of health barriers Eye exam scheduled for March 2025 Lab Results  Component Value Date   HGBA1C 6.8 (H) 06/15/2023    Heart Failure Interventions:  (Status:  New goal. and Goal on track:  Yes.) Long Term Goal Basic overview and discussion of pathophysiology of Heart Failure reviewed. Patient reports increase in shortness of breath has appointment on 08-17-2023 for follow up. She reports using 3L [place] during day and night as  needed. Provided education  on low sodium diet. Reports compliance. Reviewed Heart Failure Action Plan in depth and provided written copy Assessed need for readable accurate scales in home Provided education about placing scale on hard, flat surface Advised patient to weigh each morning after emptying bladder Discussed importance of daily weight and advised patient to weigh and record daily. Weighs daily. Reports weight of 180 lbs today. Reviewed role of diuretics in prevention of fluid overload and management of heart failure; Discussed the importance of keeping all appointments with provider Provided patient with education about the role of exercise in the management of heart failure Advised patient to discuss increase in weight/shortness of breath with provider Screening for signs and symptoms of depression related to chronic disease state  Assessed social determinant of health barriers    Patient Goals/Self-Care Activities: Take all medications as prescribed Attend all scheduled provider appointments Call pharmacy for medication refills 3-7 days in advance of running out of medications Attend church or other social activities Perform all self care activities independently  Perform IADL's (shopping, preparing meals, housekeeping, managing finances) independently Call provider office for new concerns or questions  call office if I gain more than 2 pounds in one day or 5 pounds in one week watch for swelling in feet, ankles and legs every day eat more whole grains, fruits and vegetables, lean meats and healthy fats keep appointment with eye doctor check feet daily for cuts, sores or redness take the blood sugar log to all doctor visits  Follow Up Plan:  Telephone follow up appointment with care management team member scheduled for:  08-25-2023 at 3:45 pm           Follow up plan:  Telephone follow up appointment with care management team member scheduled for:  Ms. Croft was  given information about Care Management services today including:  Care Management services include personalized support from designated clinical staff supervised by a physician, including individualized plan of care and coordination with other care providers 24/7 contact phone numbers for assistance for urgent and routine care needs. The patient may stop CCM services at any time (effective at the end of the month) by phone call to the office staff.  Patient agreed to services and verbal consent obtained.  Larey Brick, BSN RN [facility], [facility] RN Care Manager Direct Dial: [phone]  Fax: [phone]

## 2023-08-15 ENCOUNTER — Other Ambulatory Visit: Payer: Self-pay | Admitting: Family Medicine

## 2023-08-15 DIAGNOSIS — E1169 Type 2 diabetes mellitus with other specified complication: Secondary | ICD-10-CM

## 2023-08-17 ENCOUNTER — Encounter: Payer: Self-pay | Admitting: Nurse Practitioner

## 2023-08-17 ENCOUNTER — Ambulatory Visit: Admitting: Nurse Practitioner

## 2023-08-17 VITALS — BP 148/83 | HR 101 | Temp 99.7°F | Ht 62.0 in | Wt 185.8 lb

## 2023-08-17 DIAGNOSIS — M5432 Sciatica, left side: Secondary | ICD-10-CM

## 2023-08-17 DIAGNOSIS — R399 Unspecified symptoms and signs involving the genitourinary system: Secondary | ICD-10-CM

## 2023-08-17 DIAGNOSIS — M5431 Sciatica, right side: Secondary | ICD-10-CM

## 2023-08-17 LAB — URINALYSIS, COMPLETE
Bilirubin, UA: NEGATIVE
Ketones, UA: NEGATIVE
Leukocytes,UA: NEGATIVE
Nitrite, UA: NEGATIVE
Specific Gravity, UA: 1.015 (ref 1.005–1.030)
Urobilinogen, Ur: 0.2 mg/dL (ref 0.2–1.0)
pH, UA: 6 (ref 5.0–7.5)

## 2023-08-17 LAB — MICROSCOPIC EXAMINATION
Renal Epithel, UA: NONE SEEN /HPF
WBC, UA: NONE SEEN /HPF (ref 0–5)
Yeast, UA: NONE SEEN

## 2023-08-17 MED ORDER — KETOROLAC TROMETHAMINE 60 MG/2ML IM SOLN
60.0000 mg | Freq: Once | INTRAMUSCULAR | Status: AC
  Administered 2023-08-17: 60 mg via INTRAMUSCULAR

## 2023-08-17 MED ORDER — METHYLPREDNISOLONE ACETATE 80 MG/ML IJ SUSP
80.0000 mg | Freq: Once | INTRAMUSCULAR | Status: AC
  Administered 2023-08-17: 80 mg via INTRAMUSCULAR

## 2023-08-17 NOTE — Progress Notes (Addendum)
 Subjective:    Patient ID: Amber Stephenson, female    DOB: 06-Apr-1946, 78 y.o.   MRN: [id number]   Chief Complaint: Leg Pain (Bilateral leg pain started about 7 days ago. Radiating pain from hips to toes. 8/10 pain. More painful to lay down)   Leg Pain     Patient comes in c/o bil leg pain radiating down the back of her legs. She has restless leg syndrome and as trouble sleeping she started belsomra and that has helped. Rates leg pain 8/10. She has taken something R. Stacks gave her years ago but hat has not helped.   Urinary frequency for over a month. She just stopped an antibiotic last week. Patient Active Problem List   Diagnosis Date Noted   Hyponatremia 06/10/2023   RSV (respiratory syncytial virus infection) 06/10/2023   Abdominal pain 06/10/2023   Diarrhea 06/10/2023   Candidiasis of mouth 05/15/2023   Nausea and vomiting 02/16/2023   Type 2 diabetes mellitus with hyperglycemia, with long-term current use of insulin (HCC) 02/12/2023   Long term (current) use of oral hypoglycemic drugs 02/12/2023   Acute heart failure (HCC) 01/28/2023   Pure hypercholesterolemia 01/14/2023   Primary hypertension 01/14/2023   Metabolic acidosis 01/12/2023   Endometrial cancer (HCC) 01/12/2023   Acute on chronic respiratory failure with hypoxia (HCC) 01/12/2023   Vulvar itching 12/22/2022   PMB (postmenopausal bleeding) 11/13/2022   Superficial fungus infection of skin 11/13/2022   Burning with urination 11/13/2022   UTI (urinary tract infection) 11/09/2022   Vitamin B12 deficiency 11/09/2022   Scalp hematoma, initial encounter 11/09/2022   Frequent falls 09/01/2022   Hypotension 09/01/2022   Renal insufficiency 08/08/2022   AKI (acute kidney injury) (HCC) 06/27/2022   Hemorrhoids 03/11/2022   Rectal itching 03/11/2022   Microalbuminuria 01/21/2022   Tremor, essential 12/11/2021   Chronic bilateral low back pain with right-sided sciatica 07/19/2021   Vaginal itching 04/16/2021    Sensory ataxia 03/28/2021   Acute diastolic CHF (congestive heart failure) (HCC) 12/16/2020   Elevated troponin 12/14/2020   Chronic post-traumatic stress disorder (PTSD) 12/06/2020   Senile nuclear sclerosis 12/06/2020   Senile corneal changes 12/06/2020   Postmenopausal bleeding 12/06/2020   Acquired hammer toe 12/06/2020   Opioid dependence 12/06/2020   Hypermetropia 12/06/2020   Generalized weakness 12/06/2020   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   Generalized anxiety disorder 12/06/2020   Functional diarrhea 10/26/2020   Insulin dependent type 2 diabetes mellitus (HCC) 09/24/2020   Chronic respiratory failure with hypoxia (HCC) 04/25/2020   Chronic constipation 04/25/2020   CAD (coronary artery disease)    Hypocalcemia    S/P shoulder replacement, 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   Mixed hyperlipidemia 05/20/2019   Vertigo 04/25/2019   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   OSA (obstructive sleep apnea) 02/24/2019   Acute on chronic hypoxic respiratory failure (HCC) 01/22/2019   Acute pulmonary edema (HCC) 01/21/2019   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   At risk for adverse drug event 07/06/2018   Non-alcoholic fatty liver disease 01/12/2018   Mild intermittent asthma 01/12/2018   Senile osteoporosis 12/15/2017   Atrial fibrillation with rapid ventricular response (HCC) 05/19/2017   Migraine headache with aura 02/12/2016   Diabetic neuropathy (HCC) 02/06/2016   Bipolar 1 disorder (HCC) 10/04/2015   Major depressive disorder 10/03/2015   Herpes genitalis in women 07/16/2015   Long term current use of anticoagulant 05/30/2015   Chronic heart failure with preserved ejection fraction (HFpEF) (HCC) 05/30/2015   Essential hypertension    RLS (restless  legs syndrome) 04/27/2015   Insomnia 01/23/2015   Chronic atrial fibrillation with RVR (HCC) 01/04/2015       Review of Systems  Genitourinary:  Positive for dysuria, frequency and urgency.  Musculoskeletal:  Positive  for back pain.       Bil leg pain       Objective:   Physical Exam Constitutional:      Appearance: Normal appearance.  Cardiovascular:     Rate and Rhythm: Normal rate and regular rhythm.     Heart sounds: Normal heart sounds.  Pulmonary:     Breath sounds: Normal breath sounds.  Abdominal:     Tenderness: There is no right CVA tenderness or left CVA tenderness.  Musculoskeletal:     Comments: From of lumbar spine without pain (+) SLR bil at 90 degree Motor strength and sensation distally intact  Skin:    General: Skin is warm.  Neurological:     General: No focal deficit present.     Mental Status: She is alert and oriented to person, place, and time.  Psychiatric:        Mood and Affect: Mood normal.        Behavior: Behavior normal.    BP (!) 148/83   Pulse (!) 101   Temp 99.7 F (37.6 C)   Ht 5\' 2"  (1.575 m)   Wt 185 lb 12.8 oz (84.3 kg)   SpO2 94%   BMI 33.98 kg/m         Assessment & Plan:   Amber Stephenson in today with chief complaint of Leg Pain (Bilateral leg pain started about 7 days ago. Radiating pain from hips to toes. 8/10 pain. More painful to lay down)   1. UTI symptoms Urine was clear today- will wait on urine culture - Urine Culture - Urinalysis, Complete  2. Bilateral sciatica (Primary) Moist heat to legs Rest Keep legs warm at night Followu with PCP as needed Blood sugars may go up with steroids so watch diet closely the next few days - methylPREDNISolone acetate (DEPO-MEDROL) injection 80 mg - ketorolac (TORADOL) injection 60 mg    The above assessment and management plan was discussed with the patient. The patient verbalized understanding of and has agreed to the management plan. Patient is aware to call the clinic if symptoms persist or worsen. Patient is aware when to return to the clinic for a follow-up visit. Patient educated on when it is appropriate to go to the emergency department.   Amber Daphine Deutscher, FNP

## 2023-08-17 NOTE — Patient Instructions (Signed)
 Sciatica  Sciatica is pain, weakness, tingling, or loss of feeling (numbness) along the sciatic nerve. The sciatic nerve starts in the lower back and goes down the back of each leg. Sciatica usually affects one side of the body. Sciatica usually goes away on its own or with treatment. Sometimes, sciatica may come back. What are the causes? This condition happens when the sciatic nerve is pinched or has pressure put on it. This may be caused by: A disk in between the bones of the spine bulging out too far (herniated disk). Changes in the spinal disks due to aging. A condition that affects a muscle in the butt. Extra bone growth near the sciatic nerve. A break (fracture) of the area between your hip bones (pelvis). Pregnancy. Tumor. This is rare. What increases the risk? You are more likely to develop this condition if you: Play sports that put pressure or stress on the spine. Have poor strength and ease of movement (flexibility). Have had a back injury or back surgery. Sit for long periods of time. Do activities that involve bending or lifting over and over again. Are very overweight (obese). What are the signs or symptoms? Symptoms can vary from mild to very bad. They may include: Any of these problems in the lower back, leg, hip, or butt: Mild tingling, loss of feeling, or dull aches. A burning feeling. Sharp pains. Loss of feeling in the back of the calf or the sole of the foot. Leg weakness. Very bad back pain that makes it hard to move. These symptoms may get worse when you cough, sneeze, or laugh. They may also get worse when you sit or stand for long periods of time. How is this treated? This condition often gets better without any treatment. However, treatment may include: Changing or cutting back on physical activity when you have pain. Exercising, including strengthening and stretching. Putting ice or heat on the affected area. Shots of medicines to relieve pain and  swelling or to relax your muscles. Surgery. Follow these instructions at home: Medicines Take over-the-counter and prescription medicines only as told by your doctor. Ask your doctor if you should avoid driving or using machines while you are taking your medicine. Managing pain     If told, put ice on the affected area. To do this: Put ice in a plastic bag. Place a towel between your skin and the bag. Leave the ice on for 20 minutes, 2-3 times a day. If your skin turns bright red, take off the ice right away to prevent skin damage. The risk of skin damage is higher if you cannot feel pain, heat, or cold. If told, put heat on the affected area. Do this as often as told by your doctor. Use the heat source that your doctor tells you to use, such as a moist heat pack or a heating pad. Place a towel between your skin and the heat source. Leave the heat on for 20-30 minutes. If your skin turns bright red, take off the heat right away to prevent burns. The risk of burns is higher if you cannot feel pain, heat, or cold. Activity  Return to your normal activities when your doctor says that it is safe. Avoid activities that make your symptoms worse. Take short rests during the day. When you rest for a long time, do some physical activity or stretching between periods of rest. Avoid sitting for a long time without moving. Get up and move around at least one time each  hour. Do exercises and stretches as told by your doctor. Do not lift anything that is heavier than 10 lb (4.5 kg). Avoid lifting heavy things even when you do not have symptoms. Avoid lifting heavy things over and over. When you lift objects, always lift in a way that is safe for your body. To do this, you should: Bend your knees. Keep the object close to your body. Avoid twisting. General instructions Stay at a healthy weight. Wear comfortable shoes that support your feet. Avoid wearing high heels. Avoid sleeping on a mattress  that is too soft or too hard. You might have less pain if you sleep on a mattress that is firm enough to support your back. Contact a doctor if: Your pain is not controlled by medicine. Your pain does not get better. Your pain gets worse. Your pain lasts longer than 4 weeks. You lose weight without trying. Get help right away if: You cannot control when you pee (urinate) or poop (have a bowel movement). You have weakness in any of these areas and it gets worse: Lower back. The area between your hip bones. Butt. Legs. You have redness or swelling of your back. You have a burning feeling when you pee. Summary Sciatica is pain, weakness, tingling, or loss of feeling (numbness) along the sciatic nerve. This may include the lower back, legs, hips, and butt. This condition happens when the sciatic nerve is pinched or has pressure put on it. Treatment often includes rest, exercise, medicines, and putting ice or heat on the affected area. This information is not intended to replace advice given to you by your health care provider. Make sure you discuss any questions you have with your health care provider. Document Revised: 08/26/2021 Document Reviewed: 08/26/2021 Elsevier Patient Education  2024 [employer].

## 2023-08-18 ENCOUNTER — Ambulatory Visit: Payer: Self-pay | Admitting: Family Medicine

## 2023-08-18 NOTE — Telephone Encounter (Signed)
 Pt called in regards to her UA results from yesterday. This RN told pt that the provider has not yet interpreted the results. This RN told pt that the office will be notified pt is wanting results. Pt call back number confirmed: [phone]   Copied from CRM [id number]. Topic: Clinical - Lab/Test Results >> Aug 18, 2023 11:58 AM Geroge Baseman wrote: Reason for CRM: patient wants to go over UA results, no notes on the results but they are released Reason for Disposition . [1] Caller requesting NON-URGENT health information AND [2] PCP's office is the best resource  Answer Assessment - Initial Assessment Questions 1. REASON FOR CALL or QUESTION: "What is your reason for calling today?" or "How can I best help you?" or "What question do you have that I can help answer?"     UA results  Protocols used: Information Only Call - No Triage-A-AH

## 2023-08-19 ENCOUNTER — Encounter (HOSPITAL_COMMUNITY): Payer: Self-pay

## 2023-08-19 ENCOUNTER — Observation Stay (HOSPITAL_COMMUNITY)

## 2023-08-19 ENCOUNTER — Emergency Department (HOSPITAL_COMMUNITY)

## 2023-08-19 ENCOUNTER — Telehealth: Payer: Self-pay | Admitting: Cardiology

## 2023-08-19 ENCOUNTER — Inpatient Hospital Stay (HOSPITAL_COMMUNITY)
Admission: EM | Admit: 2023-08-19 | Discharge: 2023-08-21 | DRG: 690 | Disposition: A | Discharge status: Home Health | Attending: Internal Medicine | Admitting: Internal Medicine

## 2023-08-19 ENCOUNTER — Other Ambulatory Visit: Payer: Self-pay

## 2023-08-19 DIAGNOSIS — E871 Hypo-osmolality and hyponatremia: Secondary | ICD-10-CM | POA: Diagnosis present

## 2023-08-19 DIAGNOSIS — Z7984 Long term (current) use of oral hypoglycemic drugs: Secondary | ICD-10-CM

## 2023-08-19 DIAGNOSIS — Z6833 Body mass index (BMI) 33.0-33.9, adult: Secondary | ICD-10-CM | POA: Diagnosis not present

## 2023-08-19 DIAGNOSIS — I4891 Unspecified atrial fibrillation: Principal | ICD-10-CM | POA: Diagnosis present

## 2023-08-19 DIAGNOSIS — Z823 Family history of stroke: Secondary | ICD-10-CM

## 2023-08-19 DIAGNOSIS — Z811 Family history of alcohol abuse and dependence: Secondary | ICD-10-CM

## 2023-08-19 DIAGNOSIS — E1165 Type 2 diabetes mellitus with hyperglycemia: Secondary | ICD-10-CM | POA: Diagnosis present

## 2023-08-19 DIAGNOSIS — Z91040 Latex allergy status: Secondary | ICD-10-CM

## 2023-08-19 DIAGNOSIS — I5032 Chronic diastolic (congestive) heart failure: Secondary | ICD-10-CM | POA: Diagnosis present

## 2023-08-19 DIAGNOSIS — E7800 Pure hypercholesterolemia, unspecified: Secondary | ICD-10-CM | POA: Diagnosis present

## 2023-08-19 DIAGNOSIS — Z7901 Long term (current) use of anticoagulants: Secondary | ICD-10-CM

## 2023-08-19 DIAGNOSIS — N390 Urinary tract infection, site not specified: Principal | ICD-10-CM | POA: Diagnosis present

## 2023-08-19 DIAGNOSIS — Z794 Long term (current) use of insulin: Secondary | ICD-10-CM | POA: Diagnosis not present

## 2023-08-19 DIAGNOSIS — K760 Fatty (change of) liver, not elsewhere classified: Secondary | ICD-10-CM | POA: Diagnosis present

## 2023-08-19 DIAGNOSIS — G2581 Restless legs syndrome: Secondary | ICD-10-CM | POA: Diagnosis present

## 2023-08-19 DIAGNOSIS — G894 Chronic pain syndrome: Secondary | ICD-10-CM | POA: Diagnosis present

## 2023-08-19 DIAGNOSIS — Z96611 Presence of right artificial shoulder joint: Secondary | ICD-10-CM | POA: Diagnosis not present

## 2023-08-19 DIAGNOSIS — R079 Chest pain, unspecified: Secondary | ICD-10-CM | POA: Diagnosis not present

## 2023-08-19 DIAGNOSIS — Z833 Family history of diabetes mellitus: Secondary | ICD-10-CM

## 2023-08-19 DIAGNOSIS — I4811 Longstanding persistent atrial fibrillation: Secondary | ICD-10-CM | POA: Diagnosis present

## 2023-08-19 DIAGNOSIS — Z7983 Long term (current) use of bisphosphonates: Secondary | ICD-10-CM

## 2023-08-19 DIAGNOSIS — F319 Bipolar disorder, unspecified: Secondary | ICD-10-CM | POA: Diagnosis present

## 2023-08-19 DIAGNOSIS — Z87440 Personal history of urinary (tract) infections: Secondary | ICD-10-CM

## 2023-08-19 DIAGNOSIS — Z820 Family history of epilepsy and other diseases of the nervous system: Secondary | ICD-10-CM

## 2023-08-19 DIAGNOSIS — Z7951 Long term (current) use of inhaled steroids: Secondary | ICD-10-CM | POA: Diagnosis not present

## 2023-08-19 DIAGNOSIS — Z9981 Dependence on supplemental oxygen: Secondary | ICD-10-CM

## 2023-08-19 DIAGNOSIS — Z818 Family history of other mental and behavioral disorders: Secondary | ICD-10-CM

## 2023-08-19 DIAGNOSIS — I272 Pulmonary hypertension, unspecified: Secondary | ICD-10-CM | POA: Diagnosis present

## 2023-08-19 DIAGNOSIS — E8770 Fluid overload, unspecified: Secondary | ICD-10-CM | POA: Diagnosis not present

## 2023-08-19 DIAGNOSIS — I110 Hypertensive heart disease with heart failure: Secondary | ICD-10-CM | POA: Diagnosis present

## 2023-08-19 DIAGNOSIS — E1140 Type 2 diabetes mellitus with diabetic neuropathy, unspecified: Secondary | ICD-10-CM | POA: Diagnosis present

## 2023-08-19 DIAGNOSIS — J4520 Mild intermittent asthma, uncomplicated: Secondary | ICD-10-CM | POA: Diagnosis present

## 2023-08-19 DIAGNOSIS — E8729 Other acidosis: Secondary | ICD-10-CM | POA: Diagnosis present

## 2023-08-19 DIAGNOSIS — Z79899 Other long term (current) drug therapy: Secondary | ICD-10-CM

## 2023-08-19 DIAGNOSIS — M109 Gout, unspecified: Secondary | ICD-10-CM | POA: Diagnosis present

## 2023-08-19 DIAGNOSIS — R0602 Shortness of breath: Secondary | ICD-10-CM | POA: Diagnosis not present

## 2023-08-19 DIAGNOSIS — I517 Cardiomegaly: Secondary | ICD-10-CM | POA: Diagnosis not present

## 2023-08-19 DIAGNOSIS — J4489 Other specified chronic obstructive pulmonary disease: Secondary | ICD-10-CM | POA: Diagnosis present

## 2023-08-19 DIAGNOSIS — E66811 Obesity, class 1: Secondary | ICD-10-CM | POA: Diagnosis present

## 2023-08-19 DIAGNOSIS — I2510 Atherosclerotic heart disease of native coronary artery without angina pectoris: Secondary | ICD-10-CM | POA: Diagnosis present

## 2023-08-19 DIAGNOSIS — G4733 Obstructive sleep apnea (adult) (pediatric): Secondary | ICD-10-CM | POA: Diagnosis present

## 2023-08-19 DIAGNOSIS — I10 Essential (primary) hypertension: Secondary | ICD-10-CM | POA: Diagnosis present

## 2023-08-19 DIAGNOSIS — Z8249 Family history of ischemic heart disease and other diseases of the circulatory system: Secondary | ICD-10-CM

## 2023-08-19 DIAGNOSIS — Z7985 Long-term (current) use of injectable non-insulin antidiabetic drugs: Secondary | ICD-10-CM

## 2023-08-19 DIAGNOSIS — Z923 Personal history of irradiation: Secondary | ICD-10-CM

## 2023-08-19 DIAGNOSIS — F4312 Post-traumatic stress disorder, chronic: Secondary | ICD-10-CM | POA: Diagnosis present

## 2023-08-19 DIAGNOSIS — Z91041 Radiographic dye allergy status: Secondary | ICD-10-CM

## 2023-08-19 DIAGNOSIS — Z888 Allergy status to other drugs, medicaments and biological substances status: Secondary | ICD-10-CM

## 2023-08-19 LAB — BASIC METABOLIC PANEL
Anion gap: 12 (ref 5–15)
BUN: 13 mg/dL (ref 8–23)
CO2: 22 mmol/L (ref 22–32)
Calcium: 9 mg/dL (ref 8.9–10.3)
Chloride: 96 mmol/L — ABNORMAL LOW (ref 98–111)
Creatinine, Ser: 0.71 mg/dL (ref 0.44–1.00)
GFR, Estimated: >60 mL/min (ref >60)
Glucose, Bld: 283 mg/dL — ABNORMAL HIGH (ref 70–99)
Potassium: 4 mmol/L (ref 3.5–5.1)
Sodium: 130 mmol/L — ABNORMAL LOW (ref 135–145)

## 2023-08-19 LAB — CBG MONITORING, ED
Glucose-Capillary: 167 mg/dL — ABNORMAL HIGH (ref 70–99)
Glucose-Capillary: 174 mg/dL — ABNORMAL HIGH (ref 70–99)

## 2023-08-19 LAB — HEPATIC FUNCTION PANEL
ALT: 17 U/L (ref 0–44)
AST: 20 U/L (ref 15–41)
Albumin: 3.2 g/dL — ABNORMAL LOW (ref 3.5–5.0)
Alkaline Phosphatase: 100 U/L (ref 38–126)
Bilirubin, Direct: 0.1 mg/dL (ref 0.0–0.2)
Indirect Bilirubin: 0.2 mg/dL — ABNORMAL LOW (ref 0.3–0.9)
Total Bilirubin: 0.3 mg/dL (ref 0.0–1.2)
Total Protein: 6.8 g/dL (ref 6.5–8.1)

## 2023-08-19 LAB — TROPONIN I (HIGH SENSITIVITY)
Troponin I (High Sensitivity): 9 ng/L (ref <18)
Troponin I (High Sensitivity): 10 ng/L (ref <18)

## 2023-08-19 LAB — CBC
HCT: 33.2 % — ABNORMAL LOW (ref 36.0–46.0)
Hemoglobin: 11.2 g/dL — ABNORMAL LOW (ref 12.0–15.0)
MCH: 30.9 pg (ref 26.0–34.0)
MCHC: 33.7 g/dL (ref 30.0–36.0)
MCV: 91.7 fL (ref 80.0–100.0)
Platelets: 219 10*3/uL (ref 150–400)
RBC: 3.62 MIL/uL — ABNORMAL LOW (ref 3.87–5.11)
RDW: 14.4 % (ref 11.5–15.5)
WBC: 8 10*3/uL (ref 4.0–10.5)
nRBC: 0 % (ref 0.0–0.2)

## 2023-08-19 LAB — BRAIN NATRIURETIC PEPTIDE: B Natriuretic Peptide: 248.3 pg/mL — ABNORMAL HIGH (ref 0.0–100.0)

## 2023-08-19 MED ORDER — MAGNESIUM OXIDE -MG SUPPLEMENT 400 (240 MG) MG PO TABS
400.0000 mg | ORAL_TABLET | Freq: Every day | ORAL | Status: DC
  Administered 2023-08-21: 400 mg via ORAL
  Filled 2023-08-19: qty 1

## 2023-08-19 MED ORDER — HYDROXYZINE HCL 25 MG PO TABS
25.0000 mg | ORAL_TABLET | Freq: Three times a day (TID) | ORAL | Status: DC
  Administered 2023-08-19 – 2023-08-20 (×2): 25 mg via ORAL
  Filled 2023-08-19 (×5): qty 1

## 2023-08-19 MED ORDER — FUROSEMIDE 10 MG/ML IJ SOLN
40.0000 mg | Freq: Once | INTRAMUSCULAR | Status: AC
  Administered 2023-08-19: 40 mg via INTRAVENOUS
  Filled 2023-08-19: qty 4

## 2023-08-19 MED ORDER — APIXABAN 5 MG PO TABS
5.0000 mg | ORAL_TABLET | Freq: Two times a day (BID) | ORAL | Status: DC
  Administered 2023-08-19 – 2023-08-21 (×4): 5 mg via ORAL
  Filled 2023-08-19 (×4): qty 1

## 2023-08-19 MED ORDER — ACETAMINOPHEN 325 MG PO TABS: 650.0000 mg | ORAL_TABLET | Freq: Four times a day (QID) | ORAL | Status: DC | PRN

## 2023-08-19 MED ORDER — MECLIZINE HCL 25 MG PO TABS: 25.0000 mg | ORAL_TABLET | Freq: Three times a day (TID) | ORAL | Status: DC | PRN

## 2023-08-19 MED ORDER — ONDANSETRON HCL 4 MG PO TABS: 4.0000 mg | ORAL_TABLET | Freq: Four times a day (QID) | ORAL | Status: DC | PRN

## 2023-08-19 MED ORDER — ALLOPURINOL 100 MG PO TABS
100.0000 mg | ORAL_TABLET | Freq: Every day | ORAL | Status: DC
  Administered 2023-08-20 – 2023-08-21 (×2): 100 mg via ORAL
  Filled 2023-08-19 (×2): qty 1

## 2023-08-19 MED ORDER — LORAZEPAM 0.5 MG PO TABS
0.5000 mg | ORAL_TABLET | Freq: Every day | ORAL | Status: DC
Start: 2023-08-20 — End: 2023-08-21
  Filled 2023-08-19: qty 1

## 2023-08-19 MED ORDER — MOMETASONE FURO-FORMOTEROL FUM 100-5 MCG/ACT IN AERO
2.0000 | INHALATION_SPRAY | Freq: Two times a day (BID) | RESPIRATORY_TRACT | Status: DC
  Administered 2023-08-20: 2 via RESPIRATORY_TRACT
  Filled 2023-08-19 (×2): qty 8.8

## 2023-08-19 MED ORDER — DOXEPIN HCL 10 MG PO CAPS
10.0000 mg | ORAL_CAPSULE | Freq: Every day | ORAL | Status: DC
  Administered 2023-08-19 – 2023-08-20 (×2): 10 mg via ORAL
  Filled 2023-08-19 (×2): qty 1

## 2023-08-19 MED ORDER — CIPROFLOXACIN IN D5W 400 MG/200ML IV SOLN: 400.0000 mg | Freq: Two times a day (BID) | INTRAVENOUS | Status: DC

## 2023-08-19 MED ORDER — DILTIAZEM LOAD VIA INFUSION
10.0000 mg | Freq: Once | INTRAVENOUS | Status: AC
  Administered 2023-08-19: 10 mg via INTRAVENOUS
  Filled 2023-08-19: qty 10

## 2023-08-19 MED ORDER — FENTANYL CITRATE PF 50 MCG/ML IJ SOSY
50.0000 ug | PREFILLED_SYRINGE | Freq: Once | INTRAMUSCULAR | Status: AC
  Administered 2023-08-19: 50 ug via INTRAVENOUS
  Filled 2023-08-19: qty 1

## 2023-08-19 MED ORDER — INSULIN ASPART 100 UNIT/ML IJ SOLN
0.0000 [IU] | Freq: Three times a day (TID) | INTRAMUSCULAR | Status: DC
  Administered 2023-08-19: 3 [IU] via SUBCUTANEOUS
  Administered 2023-08-20 (×2): 5 [IU] via SUBCUTANEOUS
  Administered 2023-08-20: 2 [IU] via SUBCUTANEOUS
  Administered 2023-08-21: 5 [IU] via SUBCUTANEOUS

## 2023-08-19 MED ORDER — AMPICILLIN-SULBACTAM SODIUM 3 (2-1) G IJ SOLR
3.0000 g | Freq: Four times a day (QID) | INTRAMUSCULAR | Status: DC
  Administered 2023-08-19 – 2023-08-21 (×7): 3 g via INTRAVENOUS
  Filled 2023-08-19 (×7): qty 8

## 2023-08-19 MED ORDER — LOSARTAN POTASSIUM 25 MG PO TABS
25.0000 mg | ORAL_TABLET | Freq: Every day | ORAL | Status: DC
  Administered 2023-08-20 – 2023-08-21 (×2): 25 mg via ORAL
  Filled 2023-08-19 (×2): qty 1

## 2023-08-19 MED ORDER — OXYCODONE HCL 5 MG PO TABS
20.0000 mg | ORAL_TABLET | Freq: Two times a day (BID) | ORAL | Status: DC | PRN
  Administered 2023-08-19 – 2023-08-21 (×4): 20 mg via ORAL
  Filled 2023-08-19 (×4): qty 4

## 2023-08-19 MED ORDER — SENNOSIDES-DOCUSATE SODIUM 8.6-50 MG PO TABS
1.0000 | ORAL_TABLET | Freq: Every day | ORAL | Status: DC
  Administered 2023-08-19 – 2023-08-20 (×2): 1 via ORAL
  Filled 2023-08-19 (×2): qty 1

## 2023-08-19 MED ORDER — ALBUTEROL SULFATE (2.5 MG/3ML) 0.083% IN NEBU: 2.5000 mg | INHALATION_SOLUTION | RESPIRATORY_TRACT | Status: DC | PRN

## 2023-08-19 MED ORDER — METHOCARBAMOL 500 MG PO TABS
1000.0000 mg | ORAL_TABLET | Freq: Four times a day (QID) | ORAL | Status: DC | PRN
  Administered 2023-08-20 – 2023-08-21 (×3): 1000 mg via ORAL
  Filled 2023-08-19 (×3): qty 2

## 2023-08-19 MED ORDER — NALOXONE HCL 4 MG/0.1ML NA LIQD: 1.0000 | Freq: Every day | NASAL | Status: DC | PRN

## 2023-08-19 MED ORDER — ATORVASTATIN CALCIUM 80 MG PO TABS
80.0000 mg | ORAL_TABLET | Freq: Every evening | ORAL | Status: DC
  Administered 2023-08-19 – 2023-08-20 (×2): 80 mg via ORAL
  Filled 2023-08-19: qty 2
  Filled 2023-08-19: qty 1

## 2023-08-19 MED ORDER — INSULIN GLARGINE 100 UNIT/ML ~~LOC~~ SOLN
32.0000 [IU] | Freq: Two times a day (BID) | SUBCUTANEOUS | Status: DC
  Administered 2023-08-19 – 2023-08-21 (×4): 32 [IU] via SUBCUTANEOUS
  Filled 2023-08-19 (×5): qty 0.32

## 2023-08-19 MED ORDER — SODIUM CHLORIDE 0.9% FLUSH
3.0000 mL | Freq: Two times a day (BID) | INTRAVENOUS | Status: DC
  Administered 2023-08-19 – 2023-08-21 (×4): 3 mL via INTRAVENOUS

## 2023-08-19 MED ORDER — INSULIN GLARGINE-YFGN 100 UNIT/ML ~~LOC~~ SOLN: 32.0000 [IU] | Freq: Two times a day (BID) | SUBCUTANEOUS | Status: DC

## 2023-08-19 MED ORDER — ONDANSETRON HCL 4 MG/2ML IJ SOLN: 4.0000 mg | Freq: Four times a day (QID) | INTRAMUSCULAR | Status: DC | PRN

## 2023-08-19 MED ORDER — LORAZEPAM 1 MG PO TABS: 0.5000 mg | ORAL_TABLET | ORAL | Status: DC

## 2023-08-19 MED ORDER — OXCARBAZEPINE 150 MG PO TABS
150.0000 mg | ORAL_TABLET | Freq: Two times a day (BID) | ORAL | Status: DC
  Administered 2023-08-19 – 2023-08-21 (×4): 150 mg via ORAL
  Filled 2023-08-19 (×4): qty 1

## 2023-08-19 MED ORDER — HYDROXYZINE PAMOATE 25 MG PO CAPS
25.0000 mg | ORAL_CAPSULE | Freq: Three times a day (TID) | ORAL | Status: DC
  Filled 2023-08-19: qty 1

## 2023-08-19 MED ORDER — ESCITALOPRAM OXALATE 10 MG PO TABS
10.0000 mg | ORAL_TABLET | Freq: Every day | ORAL | Status: DC
  Administered 2023-08-21: 10 mg via ORAL
  Filled 2023-08-19: qty 1

## 2023-08-19 MED ORDER — METOPROLOL TARTRATE 25 MG PO TABS: 50.0000 mg | ORAL_TABLET | Freq: Two times a day (BID) | ORAL | Status: DC

## 2023-08-19 MED ORDER — HYDRALAZINE HCL 20 MG/ML IJ SOLN: 5.0000 mg | INTRAMUSCULAR | Status: DC | PRN

## 2023-08-19 MED ORDER — DILTIAZEM HCL-DEXTROSE 125-5 MG/125ML-% IV SOLN (PREMIX)
5.0000 mg/h | INTRAVENOUS | Status: DC
  Administered 2023-08-19: 5 mg/h via INTRAVENOUS
  Filled 2023-08-19: qty 125

## 2023-08-19 MED ORDER — LORAZEPAM 1 MG PO TABS
1.0000 mg | ORAL_TABLET | Freq: Every day | ORAL | Status: DC
  Administered 2023-08-19 – 2023-08-20 (×2): 1 mg via ORAL
  Filled 2023-08-19 (×2): qty 1

## 2023-08-19 MED ORDER — CLOTRIMAZOLE 10 MG MT TROC
10.0000 mg | Freq: Two times a day (BID) | OROMUCOSAL | Status: DC
  Administered 2023-08-19 – 2023-08-21 (×4): 10 mg via ORAL
  Filled 2023-08-19 (×4): qty 1

## 2023-08-19 MED ORDER — SUVOREXANT 20 MG PO TABS: 20.0000 mg | ORAL_TABLET | Freq: Every evening | ORAL | Status: DC | PRN

## 2023-08-19 MED ORDER — ACETAMINOPHEN 650 MG RE SUPP: 650.0000 mg | Freq: Four times a day (QID) | RECTAL | Status: DC | PRN

## 2023-08-19 MED ORDER — METOPROLOL TARTRATE 50 MG PO TABS
50.0000 mg | ORAL_TABLET | Freq: Two times a day (BID) | ORAL | Status: DC
  Administered 2023-08-19 – 2023-08-21 (×4): 50 mg via ORAL
  Filled 2023-08-19: qty 1
  Filled 2023-08-19 (×2): qty 2
  Filled 2023-08-19: qty 1

## 2023-08-19 MED ORDER — POTASSIUM CHLORIDE ER 10 MEQ PO TBCR: 10.0000 meq | EXTENDED_RELEASE_TABLET | Freq: Every day | ORAL | Status: DC

## 2023-08-19 MED ORDER — DILTIAZEM HCL-DEXTROSE 125-5 MG/125ML-% IV SOLN (PREMIX)
5.0000 mg/h | INTRAVENOUS | Status: DC
  Filled 2023-08-19: qty 125

## 2023-08-19 MED ORDER — INSULIN ASPART 100 UNIT/ML IJ SOLN: 0.0000 [IU] | Freq: Every day | INTRAMUSCULAR | Status: DC

## 2023-08-19 MED ORDER — PREGABALIN 75 MG PO CAPS
200.0000 mg | ORAL_CAPSULE | Freq: Two times a day (BID) | ORAL | Status: DC
  Administered 2023-08-19 – 2023-08-21 (×3): 200 mg via ORAL
  Filled 2023-08-19: qty 2
  Filled 2023-08-19 (×2): qty 1

## 2023-08-19 MED ORDER — LORATADINE 10 MG PO TABS
10.0000 mg | ORAL_TABLET | Freq: Every evening | ORAL | Status: DC
  Administered 2023-08-19 – 2023-08-20 (×2): 10 mg via ORAL
  Filled 2023-08-19 (×2): qty 1

## 2023-08-19 MED ORDER — POTASSIUM CHLORIDE CRYS ER 10 MEQ PO TBCR
10.0000 meq | EXTENDED_RELEASE_TABLET | Freq: Every day | ORAL | Status: DC
  Administered 2023-08-19 – 2023-08-21 (×2): 10 meq via ORAL
  Filled 2023-08-19 (×4): qty 1

## 2023-08-19 MED ORDER — ARIPIPRAZOLE 2 MG PO TABS
2.0000 mg | ORAL_TABLET | Freq: Every day | ORAL | Status: DC
  Administered 2023-08-19 – 2023-08-20 (×2): 2 mg via ORAL
  Filled 2023-08-19 (×2): qty 1

## 2023-08-19 MED ORDER — PANTOPRAZOLE SODIUM 40 MG PO TBEC
40.0000 mg | DELAYED_RELEASE_TABLET | Freq: Every day | ORAL | Status: DC
  Administered 2023-08-20 – 2023-08-21 (×2): 40 mg via ORAL
  Filled 2023-08-19 (×2): qty 1

## 2023-08-19 MED ORDER — ROPINIROLE HCL 1 MG PO TABS
1.0000 mg | ORAL_TABLET | Freq: Every day | ORAL | Status: DC
  Administered 2023-08-19 – 2023-08-20 (×2): 1 mg via ORAL
  Filled 2023-08-19 (×2): qty 1

## 2023-08-19 NOTE — Telephone Encounter (Signed)
 Trevia, Nop - 08/19/2023  8:21 AM Quintella Reichert, MD  Sent: Wed August 19, 2023  9:35 AM  To: Loa Socks, LPN         Message  Agree with plan

## 2023-08-19 NOTE — ED Triage Notes (Addendum)
 Pt arrives via POV. Pt reports for the past week she has been experiencing chest pain, sob, palpitations. Hx of chf and afib. States her hr is usually in the 80s. Pt is AxOx4.

## 2023-08-19 NOTE — ED Provider Notes (Signed)
 [place] EMERGENCY DEPARTMENT AT [facility] Provider Note   CSN: [id number] Arrival date & time: 08/19/23  4098     History  Chief Complaint  Patient presents with   Chest Pain   Shortness of Breath    Amber Stephenson is a 78 y.o. female.  Patient here with chest pain shortness of breath palpitations for the last week.  History of heart failure A-fib.  She feels like she is retaining fluid.  Does not usually get leg swelling.  Symptoms are worse with ambulation.  Heart rates been a little bit more elevated mostly in the low 100s.  She is on Eliquis.  She has not noticed any major weight gain but she has not keep track of those things.  She has history of chronic pain PTSD fatty liver disease CAD.  She did think she had a low-grade fever earlier as well.  Maybe cough.  The history is provided by the patient.       Home Medications Prior to Admission medications   Medication Sig Start Date End Date Taking? Authorizing Provider  hydrOXYzine (VISTARIL) 25 MG capsule Take by mouth. 07/08/23  Yes [provider]  albuterol (VENTOLIN HFA) 108 (90 Base) MCG/ACT inhaler Inhale 2 puffs into the lungs every 4 (four) hours as needed for wheezing or shortness of breath. 06/15/23   Mechele Claude, MD  allopurinol (ZYLOPRIM) 100 MG tablet Take 100 mg by mouth daily. 10/22/22   [provider]  amoxicillin-clavulanate (AUGMENTIN) 875-125 MG tablet Take 1 tablet by mouth 2 (two) times daily. 08/04/23   Daphine Deutscher Mary-Margaret, FNP  apixaban (ELIQUIS) 5 MG TABS tablet Take 1 tablet (5 mg total) by mouth 2 (two) times daily. 09/09/22   Quintella Reichert, MD  ARIPiprazole (ABILIFY) 2 MG tablet Take 2 mg by mouth at bedtime. 10/07/21   [provider]  atorvastatin (LIPITOR) 80 MG tablet Take 1 tablet (80 mg total) by mouth daily. 02/05/23 06/11/23  Sharlene Dory, PA-C  benzonatate (TESSALON) 100 MG capsule TAKE 2 CAPSULES(200 MG) BY MOUTH THREE TIMES DAILY AS NEEDED FOR COUGH  06/19/23   Mechele Claude, MD  clotrimazole (MYCELEX) 10 MG troche Take 10 mg by mouth in the morning and at bedtime. 05/28/23   [provider]  Continuous Glucose Sensor (DEXCOM G7 SENSOR) MISC CHANGE SENSOR EVERY 10 DAYS 07/20/23   Shamleffer, Konrad Dolores, MD  cyanocobalamin (VITAMIN B12) 1000 MCG tablet Take 1 tablet (1,000 mcg total) by mouth daily. 09/04/22   Ghimire, Werner Lean, MD  denosumab (PROLIA) 60 MG/ML SOSY injection Inject 60 mg into the skin every 6 (six) months. 08/14/20   Mechele Claude, MD  doxepin (SINEQUAN) 10 MG capsule Take 10 mg by mouth at bedtime. 11/27/22   [provider]  escitalopram (LEXAPRO) 10 MG tablet Take 10 mg by mouth daily.    [provider]  fluticasone-salmeterol (ADVAIR DISKUS) 100-50 MCG/ACT AEPB Inhale 1 puff into the lungs 2 (two) times daily. Patient taking differently: Inhale 1 puff into the lungs 2 (two) times daily as needed (for flares). 09/11/22   Parrett, Virgel Bouquet, NP  Galcanezumab-gnlm (EMGALITY) 120 MG/ML SOAJ INJECT 1 PEN(120MG ) UNDER THE SKIN ONCE MONTHLY 07/22/23   Marcos Eke, PA-C  Insulin Disposable Pump (OMNIPOD 5 G7 INTRO, GEN 5,) KIT 1 Device by Does not apply route every other day. 02/12/23   Shamleffer, Konrad Dolores, MD  Insulin Disposable Pump (OMNIPOD 5 G7 PODS, GEN 5,) MISC 1 Device by Does  not apply route every other day. 02/12/23   Shamleffer, Konrad Dolores, MD  insulin glargine (LANTUS SOLOSTAR) 100 UNIT/ML Solostar Pen Inject 32 Units into the skin 2 (two) times daily. 05/22/23   Shamleffer, Konrad Dolores, MD  insulin lispro (HUMALOG KWIKPEN) 100 UNIT/ML KwikPen Max daily 45 units Patient taking differently: Inject 6-45 Units into the skin 3 (three) times daily. Max daily 45 units 05/22/23   Shamleffer, Konrad Dolores, MD  Insulin Pen Needle 29G X MISC 1 Device by Does not apply route daily in the afternoon. 05/22/23   Shamleffer, Konrad Dolores, MD  levocetirizine (XYZAL ALLERGY 24HR) 5 MG  tablet Take 1 tablet (5 mg total) by mouth every evening. For itch 05/12/23   Mechele Claude, MD  lidocaine (XYLOCAINE) 2 % solution Use as directed 15 mLs in the mouth or throat every 4 (four) hours as needed for mouth pain. 05/18/23   Mechele Claude, MD  LORazepam (ATIVAN) 0.5 MG tablet Take 0.5-1 mg by mouth See admin instructions. Take 0.5 mg by mouth in the morning and 1 mg at bedtime    [provider]  losartan (COZAAR) 25 MG tablet Take 25 mg by mouth daily. 02/14/23   [provider]  meclizine (ANTIVERT) 25 MG tablet Take 1 tablet (25 mg total) by mouth 3 (three) times daily as needed for dizziness. 06/05/23   Dettinger, Elige Radon, MD  metFORMIN (GLUCOPHAGE-XR) 500 MG 24 hr tablet Take 2 tablets (1,000 mg total) by mouth daily with breakfast. 05/22/23   Shamleffer, Konrad Dolores, MD  methocarbamol 1000 MG TABS Take 1,000 mg by mouth every 6 (six) hours as needed for muscle spasms. 02/16/23   Lovorn, Aundra Millet, MD  metoprolol tartrate (LOPRESSOR) 50 MG tablet Take 1 tablet (50 mg total) by mouth 2 (two) times daily. 01/06/23   Quintella Reichert, MD  naloxone [facility]) nasal spray 4 mg/0.1 mL Place 1 spray into the nose once as needed (AS DIRECTED). 08/08/21   [provider]  nystatin (MYCOSTATIN/NYSTOP) powder Apply 1 Application topically as needed (rash). 06/16/22   Shamleffer, Konrad Dolores, MD  nystatin ointment (MYCOSTATIN) Apply 1 Application topically 2 (two) times daily as needed (for rashes). 04/06/23   Adline Potter, NP  omeprazole (PRILOSEC) 40 MG capsule Take 40 mg by mouth daily.    [provider]  ondansetron (ZOFRAN) 4 MG tablet Take 1 tablet (4 mg total) by mouth every 8 (eight) hours as needed for nausea or vomiting. For cancer related nausea 02/16/23   Lovorn, Aundra Millet, MD  OXcarbazepine (TRILEPTAL) 150 MG tablet Take 1 tablet (150 mg total) by mouth 2 (two) times daily. 06/13/22   Marguerita Merles Latif, DO  Oxycodone HCl 20 MG TABS Take 1 tablet (20 mg  total) by mouth 2 (two) times daily as needed. Do Not Fill Before 08/09/2023 07/03/23   Jones Bales, NP  pantoprazole (PROTONIX) 40 MG tablet Take 40 mg by mouth daily as needed (for reflux).    [provider]  potassium chloride (KLOR-CON) 10 MEQ tablet Take 1 tablet (10 mEq total) by mouth daily. 02/05/23 08/13/23  Sharlene Dory, PA-C  pregabalin (LYRICA) 200 MG capsule Take 200 mg by mouth 2 (two) times daily.    [provider]  promethazine (PHENERGAN) 25 MG tablet Take 0.5-1 tablets (12.5-25 mg total) by mouth every 6 (six) hours as needed for refractory nausea / vomiting (cancer related nausea/vomiting). 02/16/23   Lovorn, Aundra Millet, MD  promethazine-dextromethorphan (PROMETHAZINE-DM) 6.25-15 MG/5ML syrup Take 5  mLs by mouth 4 (four) times daily as needed for cough. 06/26/23   Laurence Spates, MD  rOPINIRole (REQUIP) 1 MG tablet Take 1 tablet (1 mg total) by mouth at bedtime. For leg cramps 05/12/23   Mechele Claude, MD  senna-docusate (SENOKOT-S) 8.6-50 MG tablet Take 1 tablet by mouth at bedtime. 06/13/22   Marguerita Merles Latif, DO  spironolactone (ALDACTONE) 25 MG tablet Take 0.5 tablets (12.5 mg total) by mouth daily. 01/31/23 06/11/23  Narda Bonds, MD  Suvorexant (BELSOMRA) 20 MG TABS Take 1 tablet (20 mg total) by mouth at bedtime as needed. 08/13/23   Mechele Claude, MD  tirzepatide [facility]) 10 MG/0.5ML Pen Inject 10 mg into the skin once a week. 06/29/23   Shamleffer, Konrad Dolores, MD  TYLENOL 500 MG tablet Take 500-1,000 mg by mouth every 6 (six) hours as needed for mild pain (pain score 1-3) or headache.    [provider]  Vitamin D, Cholecalciferol, 25 MCG (1000 UT) TABS Take 1,000 Units by mouth daily in the afternoon. 07/26/20   [provider]      Allergies    Iodine, Ivp dye [iodinated contrast media], Latex, Tape, and Tizanidine    Review of Systems   Review of Systems  Physical Exam Updated Vital Signs BP (!) 136/97   Pulse 96    Temp 97.9 F (36.6 C)   Resp 20   SpO2 98%  Physical Exam Vitals and nursing note reviewed.  Constitutional:      General: She is not in acute distress.    Appearance: She is well-developed.  HENT:     Head: Normocephalic and atraumatic.  Eyes:     Conjunctiva/sclera: Conjunctivae normal.  Cardiovascular:     Rate and Rhythm: Normal rate and regular rhythm.     Heart sounds: No murmur heard. Pulmonary:     Effort: Pulmonary effort is normal. No respiratory distress.     Breath sounds: Normal breath sounds.  Abdominal:     Palpations: Abdomen is soft.     Tenderness: There is no abdominal tenderness.  Musculoskeletal:        General: No swelling.     Cervical back: Neck supple.  Skin:    General: Skin is warm and dry.     Capillary Refill: Capillary refill takes less than 2 seconds.  Neurological:     Mental Status: She is alert.  Psychiatric:        Mood and Affect: Mood normal.     ED Results / Procedures / Treatments   Labs (all labs ordered are listed, but only abnormal results are displayed) Labs Reviewed  BASIC METABOLIC PANEL - Abnormal; Notable for the following components:      Result Value   Sodium 130 (*)    Chloride 96 (*)    Glucose, Bld 283 (*)    All other components within normal limits  CBC - Abnormal; Notable for the following components:   RBC 3.62 (*)    Hemoglobin 11.2 (*)    HCT 33.2 (*)    All other components within normal limits  BRAIN NATRIURETIC PEPTIDE - Abnormal; Notable for the following components:   B Natriuretic Peptide 248.3 (*)    All other components within normal limits  HEPATIC FUNCTION PANEL - Abnormal; Notable for the following components:   Albumin 3.2 (*)    Indirect Bilirubin 0.2 (*)    All other components within normal limits  RESP PANEL BY RT-PCR (RSV, FLU A&B, COVID)  [id number]  TROPONIN I (HIGH SENSITIVITY)  TROPONIN I (HIGH SENSITIVITY)    EKG EKG Interpretation Date/Time:  Wednesday August 19 2023 10:17:56  EDT Ventricular Rate:  129 PR Interval:    QRS Duration:  72 QT Interval:  318 QTC Calculation: 465 R Axis:   -24  Text Interpretation: Atrial fibrillation with rapid ventricular response Nonspecific ST and T wave abnormality Abnormal ECG When compared with ECG of 26-Jun-2023 18:19, PREVIOUS ECG IS PRESENT Confirmed by Virgina Norfolk [phone]) on 08/19/2023 11:16:26 AM  Radiology DG Chest Portable 1 View Result Date: 08/19/2023 CLINICAL DATA:  Shortness of breath, chest pain. EXAM: PORTABLE CHEST 1 VIEW COMPARISON:  June 26, 2023. FINDINGS: Stable cardiomegaly. No acute pulmonary disease is noted. Status post right shoulder arthroplasty. IMPRESSION: No acute abnormality seen. Electronically Signed   By: Lupita Raider M.D.   On: 08/19/2023 14:06    Procedures .Critical Care  Performed by: Virgina Norfolk, DO Authorized by: Virgina Norfolk, DO   Critical care provider statement:    Critical care time (minutes):  35   Critical care was necessary to treat or prevent imminent or life-threatening deterioration of the following conditions: atrial fibrillation with rvr.   Critical care was time spent personally by me on the following activities:  Blood draw for specimens, development of treatment plan with patient or surrogate, discussions with primary provider, examination of patient, evaluation of patient's response to treatment, obtaining history from patient or surrogate, ordering and review of laboratory studies, ordering and performing treatments and interventions, ordering and review of radiographic studies, pulse oximetry and re-evaluation of patient's condition   I assumed direction of critical care for this patient from another provider in my specialty: no     Care discussed with: admitting provider       Medications Ordered in ED Medications  diltiazem (CARDIZEM) 1 mg/mL load via infusion 10 mg (10 mg Intravenous Bolus from Bag 08/19/23 1345)    And  diltiazem (CARDIZEM) 125 mg in  dextrose 5% 125 mL (1 mg/mL) infusion (5 mg/hr Intravenous New Bag/Given 08/19/23 1345)  fentaNYL (SUBLIMAZE) injection 50 mcg (has no administration in time range)  furosemide (LASIX) injection 40 mg (40 mg Intravenous Given 08/19/23 1333)    ED Course/ Medical Decision Making/ A&P                                 Medical Decision Making Amount and/or Complexity of Data Reviewed Labs: ordered. Radiology: ordered.  Risk Prescription drug management. Decision regarding hospitalization.   KAYLAMARIE SWICKARD is here with shortness of breath palpitations.  EKG shows atrial fibrillation with rate in the 120s.  Bt 110-140 on monitor in the ED. She has some rales on exam, not much edema in her legs but she states she does not usually get much edema when fluid builds up.  She is short of breath with exertion hard to lie flat.  She is on blood thinner.  Overall she does state that maybe she had a low-grade fever today.  Mild cough.  Differential could be heart failure excerbation or afib causing issues, seems less likely to be ACS could be viral process pneumonia.  Will check labs including CBC BMP troponin BNP chest x-ray and reevaluate.    Overall chest x-ray does not show any major volume overload but BNP mildly elevated.  Troponin normal.  Electrolytes are unremarkable.  Per my review interpretation labs no major acute  findings.  Clinically I think she does look volume overloaded.  I have started her on diltiazem bolus and infusion given her A-fib as she is having runs up into the 140s.  My suspicion is that she is mildly volume overloaded which might be playing a role in her A-fib being more erratic.  She is typically always in A-fib.  Cardioversions have not been successful in the past.  Will try to rate control her with diltiazem.  She has been having some ongoing chronic back pain for a long time she is on chronic pain medicine for this.  She is given IV fentanyl for this.  No infectious findings on  workup today as well.  Will admit her for further A-fib control, diuresis.  Admitted to hospitalist service for further care.  This chart was dictated using voice recognition software.  Despite best efforts to proofread,  errors can occur which can change the documentation meaning.         Final Clinical Impression(s) / ED Diagnoses Final diagnoses:  Atrial fibrillation with RVR (HCC)  Hypervolemia, unspecified hypervolemia type    Rx / DC Orders ED Discharge Orders     None         Virgina Norfolk, DO 08/19/23 1436

## 2023-08-19 NOTE — ED Notes (Signed)
 Pt pulling off cardiac leads and will not keep them on. Multiple attempts to place leads back on pt, and educate on need for them

## 2023-08-19 NOTE — Telephone Encounter (Signed)
 Pt c/o Shortness Of Breath: STAT if SOB developed within the last 24 hours or pt is noticeably SOB on the phone  1. Are you currently SOB (can you hear that pt is SOB on the phone)? yes  2. How long have you been experiencing SOB? 1 week and getting worse  3. Are you SOB when sitting or when up moving around? both  4. Are you currently experiencing any other symptoms? Elevated HR, Some CP

## 2023-08-19 NOTE — Telephone Encounter (Signed)
 Operators called into triage to have a triage nurse directly speak with the pt about acute symptoms.   Pt is calling in with complaints of severe sob and intermittent sharp chest pain for the last week.  While speaking with her on the phone, I audibly noted she sounded very sob.   She states her HR is 104 bpm and SP02 90% RA.  She states her 02 levels are always 95-100% RA.   She denies any bilateral lower extremity edema or acute weight gain.  She states the intermittent chest pain is mid-sternal and severely sharp when it occurs.  She also complains of orthopnea.   Pt has a significant respiratory and cardiac history, and as I was speaking to her, her sob sounded like it was worsening and she was having more difficulty completing full sentences without having to take several breaths in between.    Asked the pt to place her husband on the phone.  Spoke with her husband and advised for him to call 911 now and have the pt transported to [facility] for cardiac/respiratory work-up.  Informed him that I will share this plan with Dr. Mayford Knife so that she is aware of this.   Pts husband verbalized understanding and agrees with this plan.  He states he will call 911 for the pt immediately after hanging up.

## 2023-08-19 NOTE — H&P (Signed)
 History and Physical    Patient: Amber Stephenson NWG:[id number] DOB: [birth year] DOA: 08/19/2023 DOS: the patient was seen and examined on 08/19/2023 PCP: Mechele Claude, MD  Patient coming from: Home - lives with husband; NOK: Husband, [phone]   Chief Complaint: CP/SOB  HPI: Amber Stephenson is a 78 y.o. female with medical history significant of afib, bipolar d/o, CAD, chronic diastolic CHF, chronic back pain, HTN, HLD, NASH,OSA, pulmonary HTN, and DM who presented on 3/19 with CP/SOB.  She reports that she has been feeling poorly for about 2 weeks.  She had a urinary infection, changed medications, "culture was still bad."  Her HR was still high, couldn't sleep, BP was high.  She has an appointment next week with the cardiologist; she called today and the cards told her to come in.  She is still having frequency, dysuria.  Fever 2 days ago to 99.  She is having B back pain, usually only has on one side and it is higher (usually in hip region, currently in B renal area).  She has been having intermittent CP for about a week and a half.  SOB a lot, uses nocturnal O2 and prn during the day.   She has not had to change her O2.  She is always in afib, usually 80-90s but Monday was in low 100s.  Her husband checked this AM and it was higher than that.  She has had ongoing urinary symptoms.   2/6 UCx with Enterococcus, treated with Macrobid x 5 days.  Repeat culture on 3/4 also with Enterococcus, treated with Augmentin x 7 days.  Repeat urine culture on 3/17 is pending with prelim GNR.   ER Course:  CP/SOB, palpitations.  Afib erratic, 130s on EKG.  Started on Diltiazem drip and gave Lasix.  CXR negative.  Likely to benefit from diuresis.  Has had cardioversion in the past without success.  Has chronic back pain, low suspicion for dissection.     Review of Systems: As mentioned in the history of present illness. All other systems reviewed and are negative. 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   Benign paroxysmal positional vertigo due to bilateral vestibular disorder 05/20/2019   Bipolar 1 disorder (HCC) 01/23/2015   with GAD, benzo dependence   CAD (coronary artery disease)    Nonobstructive; Managed by Dr. Purvis Sheffield   Cardiomegaly 01/12/2018   CHF (congestive heart failure) 06/08/2022   Chronic back pain 01/04/2015   Chronic constipation 04/25/2020   Chronic diastolic heart failure (HCC) 05/30/2015   Chronic pain syndrome 08/22/2019   back pain, sacroiliitis   Chronic post-traumatic stress disorder (PTSD) 12/06/2020   Diabetic neuropathy (HCC) 02/06/2016   Dyslipidemia 09/24/2020   Essential hypertension    Functional diarrhea 10/26/2020   Herpes genitalis in women 07/16/2015   History of adenomatous polyp of colon 05/21/2019   Overview:   03/31/17: Colonoscopy: nonadvanced adenoma, microscopic colitis, f/u 5 yrs, [facility]/GAP   History of radiation therapy    Endometrial- 02/18/23-03/31/23-Dr. Antony Blackbird   Insomnia 01/23/2015   Migraine headache with aura 02/12/2016   Myofascial pain dysfunction syndrome 08/22/2019   Non-alcoholic fatty liver disease 01/12/2018   OSA (obstructive sleep apnea) 02/24/2019   10/09/2018 - HST  - AHI 40.6  Osteopenia 12/06/2020   Rx alendronate 35 mg.   Pinguecula 12/06/2020   Presbyopia 12/06/2020   Pulmonary hypertension    RLS (restless legs syndrome) 04/27/2015   Tremor, essential 12/11/2021   Type II diabetes mellitus (HCC) 09/24/2020   Past Surgical History:  Procedure Laterality Date   BREAST REDUCTION SURGERY     EYE SURGERY Right    cateracts   HAMMER TOE SURGERY     LEFT HEART CATH AND CORONARY ANGIOGRAPHY N/A 02/03/2018   Procedure: LEFT  HEART CATH AND CORONARY ANGIOGRAPHY;  Surgeon: [name], Peter M, MD;  Location: [facility] INVASIVE CV LAB;  Service: Cardiovascular;  Laterality: N/A;   REVERSE SHOULDER ARTHROPLASTY Right 08/19/2019   Procedure: REVERSE SHOULDER ARTHROPLASTY;  Surgeon: Beverely Low, MD;  Location: [facility] ORS;  Service: Orthopedics;  Laterality: Right;  interscalene block   SHOULDER SURGERY Right    "I BROKE MY SHOUDLER   THIGH SURGERY     "TO REMOVE A TUMOR "   Social History:  reports that she has never smoked. She has never used smokeless tobacco. She reports that she does not currently use alcohol. She reports that she does not use drugs.  Allergies  Allergen Reactions   Iodine Anaphylaxis   Ivp Dye [Iodinated Contrast Media] Anaphylaxis, Swelling and Other (See Comments)    Throat closes    Latex Other (See Comments)    Latex tape pulls skin with it   Tape Other (See Comments)    Pulls off the skin, if latex   Tizanidine Other (See Comments)    Weakness, goofy, bad dreams    Family History  Problem Relation Age of Onset   Diabetes Mother    Heart disease Mother    Alzheimer's disease Father    Heart disease Sister        CABG   Diabetes Sister    Alcohol abuse Sister    Stroke Brother    Heart disease Brother    Mental illness Brother    Diabetes Brother    Breast cancer Neg Hx    Ovarian cancer Neg Hx    Colon cancer Neg Hx    Endometrial cancer Neg Hx     Prior to Admission medications   Medication Sig Start Date End Date Taking? Authorizing Provider  hydrOXYzine (VISTARIL) 25 MG capsule Take by mouth. 07/08/23  Yes [provider]  albuterol (VENTOLIN HFA) 108 (90 Base) MCG/ACT inhaler Inhale 2 puffs into the lungs every 4 (four) hours as needed for wheezing or shortness of breath. 06/15/23   Mechele Claude, MD  allopurinol (ZYLOPRIM) 100 MG tablet Take 100 mg by mouth daily. 10/22/22   [provider]  amoxicillin-clavulanate (AUGMENTIN) 875-125 MG tablet Take 1 tablet by  mouth 2 (two) times daily. 08/04/23   Daphine Deutscher Mary-Margaret, FNP  apixaban (ELIQUIS) 5 MG TABS tablet Take 1 tablet (5 mg total) by mouth 2 (two) times daily. 09/09/22   Quintella Reichert, MD  ARIPiprazole (ABILIFY) 2 MG tablet Take 2 mg by mouth at bedtime. 10/07/21   [provider]  atorvastatin (LIPITOR) 80 MG tablet Take 1 tablet (80 mg total) by mouth daily. 02/05/23 06/11/23  Sharlene Dory, PA-C  benzonatate (TESSALON) 100 MG capsule TAKE 2 CAPSULES(200 MG) BY MOUTH THREE TIMES DAILY AS NEEDED FOR COUGH 06/19/23   Mechele Claude, MD  clotrimazole (MYCELEX) 10 MG troche Take 10 mg by mouth in the morning and at bedtime. 05/28/23   [provider]  Continuous Glucose Sensor [facility]  G7 SENSOR) MISC CHANGE SENSOR EVERY 10 DAYS 07/20/23   Shamleffer, Konrad Dolores, MD  cyanocobalamin (VITAMIN B12) 1000 MCG tablet Take 1 tablet (1,000 mcg total) by mouth daily. 09/04/22   Ghimire, Werner Lean, MD  denosumab (PROLIA) 60 MG/ML SOSY injection Inject 60 mg into the skin every 6 (six) months. 08/14/20   Mechele Claude, MD  doxepin (SINEQUAN) 10 MG capsule Take 10 mg by mouth at bedtime. 11/27/22   [provider]  escitalopram (LEXAPRO) 10 MG tablet Take 10 mg by mouth daily.    [provider]  fluticasone-salmeterol (ADVAIR DISKUS) 100-50 MCG/ACT AEPB Inhale 1 puff into the lungs 2 (two) times daily. Patient taking differently: Inhale 1 puff into the lungs 2 (two) times daily as needed (for flares). 09/11/22   Parrett, Virgel Bouquet, NP  Galcanezumab-gnlm (EMGALITY) 120 MG/ML SOAJ INJECT 1 PEN(120MG ) UNDER THE SKIN ONCE MONTHLY 07/22/23   Marcos Eke, PA-C  Insulin Disposable Pump (OMNIPOD 5 G7 INTRO, GEN 5,) KIT 1 Device by Does not apply route every other day. 02/12/23   Shamleffer, Konrad Dolores, MD  Insulin Disposable Pump (OMNIPOD 5 G7 PODS, GEN 5,) MISC 1 Device by Does not apply route every other day. 02/12/23   Shamleffer, Konrad Dolores, MD  insulin glargine (LANTUS SOLOSTAR)  100 UNIT/ML Solostar Pen Inject 32 Units into the skin 2 (two) times daily. 05/22/23   Shamleffer, Konrad Dolores, MD  insulin lispro (HUMALOG KWIKPEN) 100 UNIT/ML KwikPen Max daily 45 units Patient taking differently: Inject 6-45 Units into the skin 3 (three) times daily. Max daily 45 units 05/22/23   Shamleffer, Konrad Dolores, MD  Insulin Pen Needle 29G X MISC 1 Device by Does not apply route daily in the afternoon. 05/22/23   Shamleffer, Konrad Dolores, MD  levocetirizine (XYZAL ALLERGY 24HR) 5 MG tablet Take 1 tablet (5 mg total) by mouth every evening. For itch 05/12/23   Mechele Claude, MD  lidocaine (XYLOCAINE) 2 % solution Use as directed 15 mLs in the mouth or throat every 4 (four) hours as needed for mouth pain. 05/18/23   Mechele Claude, MD  LORazepam (ATIVAN) 0.5 MG tablet Take 0.5-1 mg by mouth See admin instructions. Take 0.5 mg by mouth in the morning and 1 mg at bedtime    [provider]  losartan (COZAAR) 25 MG tablet Take 25 mg by mouth daily. 02/14/23   [provider]  meclizine (ANTIVERT) 25 MG tablet Take 1 tablet (25 mg total) by mouth 3 (three) times daily as needed for dizziness. 06/05/23   Dettinger, Elige Radon, MD  metFORMIN (GLUCOPHAGE-XR) 500 MG 24 hr tablet Take 2 tablets (1,000 mg total) by mouth daily with breakfast. 05/22/23   Shamleffer, Konrad Dolores, MD  methocarbamol 1000 MG TABS Take 1,000 mg by mouth every 6 (six) hours as needed for muscle spasms. 02/16/23   Lovorn, Aundra Millet, MD  metoprolol tartrate (LOPRESSOR) 50 MG tablet Take 1 tablet (50 mg total) by mouth 2 (two) times daily. 01/06/23   Quintella Reichert, MD  naloxone [facility]) nasal spray 4 mg/0.1 mL Place 1 spray into the nose once as needed (AS DIRECTED). 08/08/21   [provider]  nystatin (MYCOSTATIN/NYSTOP) powder Apply 1 Application topically as needed (rash). 06/16/22   Shamleffer, Konrad Dolores, MD  nystatin ointment (MYCOSTATIN) Apply 1 Application topically 2 (two) times  daily as needed (for rashes). 04/06/23   Adline Potter, NP  omeprazole (PRILOSEC) 40 MG capsule Take 40 mg by mouth daily.  [provider]  ondansetron (ZOFRAN) 4 MG tablet Take 1 tablet (4 mg total) by mouth every 8 (eight) hours as needed for nausea or vomiting. For cancer related nausea 02/16/23   Lovorn, Aundra Millet, MD  OXcarbazepine (TRILEPTAL) 150 MG tablet Take 1 tablet (150 mg total) by mouth 2 (two) times daily. 06/13/22   Marguerita Merles Latif, DO  Oxycodone HCl 20 MG TABS Take 1 tablet (20 mg total) by mouth 2 (two) times daily as needed. Do Not Fill Before 08/09/2023 07/03/23   Jones Bales, NP  pantoprazole (PROTONIX) 40 MG tablet Take 40 mg by mouth daily as needed (for reflux).    [provider]  potassium chloride (KLOR-CON) 10 MEQ tablet Take 1 tablet (10 mEq total) by mouth daily. 02/05/23 08/13/23  Sharlene Dory, PA-C  pregabalin (LYRICA) 200 MG capsule Take 200 mg by mouth 2 (two) times daily.    [provider]  promethazine (PHENERGAN) 25 MG tablet Take 0.5-1 tablets (12.5-25 mg total) by mouth every 6 (six) hours as needed for refractory nausea / vomiting (cancer related nausea/vomiting). 02/16/23   Lovorn, Aundra Millet, MD  promethazine-dextromethorphan (PROMETHAZINE-DM) 6.25-15 MG/5ML syrup Take 5 mLs by mouth 4 (four) times daily as needed for cough. 06/26/23   Laurence Spates, MD  rOPINIRole (REQUIP) 1 MG tablet Take 1 tablet (1 mg total) by mouth at bedtime. For leg cramps 05/12/23   Mechele Claude, MD  senna-docusate (SENOKOT-S) 8.6-50 MG tablet Take 1 tablet by mouth at bedtime. 06/13/22   Marguerita Merles Latif, DO  spironolactone (ALDACTONE) 25 MG tablet Take 0.5 tablets (12.5 mg total) by mouth daily. 01/31/23 06/11/23  Narda Bonds, MD  Suvorexant (BELSOMRA) 20 MG TABS Take 1 tablet (20 mg total) by mouth at bedtime as needed. 08/13/23   Mechele Claude, MD  tirzepatide [facility]) 10 MG/0.5ML Pen Inject 10 mg into the skin once a week. 06/29/23    Shamleffer, Konrad Dolores, MD  TYLENOL 500 MG tablet Take 500-1,000 mg by mouth every 6 (six) hours as needed for mild pain (pain score 1-3) or headache.    [provider]  Vitamin D, Cholecalciferol, 25 MCG (1000 UT) TABS Take 1,000 Units by mouth daily in the afternoon. 07/26/20   [provider]    Physical Exam: Vitals:   08/19/23 1815 08/19/23 1830 08/19/23 1845 08/19/23 1850  BP: (!) 130/93 (!) 140/70 (!) 155/134 (!) 141/81  Pulse: 76 78 87 88  Resp: (!) 22 (!) 23 20 (!) 21  Temp:      TempSrc:      SpO2: 96% 97% 93% 96%   General:  Appears calm and comfortable and is in NAD Eyes:   EOMI, normal lids, iris ENT:  grossly normal hearing, lips & tongue, mmm Neck:  no LAD, masses or thyromegaly Cardiovascular:  Irregularly irregular, rate controlled. No LE edema.  Respiratory:   CTA bilaterally with no wheezes/rales/rhonchi.  Normal respiratory effort. Abdomen:  soft, RLQ TTP Back:   normal alignment, +R > L CVAT Skin:  no rash or induration seen on limited exam Musculoskeletal:  grossly normal tone BUE/BLE, good ROM, no bony abnormality Psychiatric:  grossly normal mood and affect, speech fluent and appropriate, AOx3 Neurologic:  CN 2-12 grossly intact, moves all extremities in coordinated fashion, sensation intact   Radiological Exams on Admission: Independently reviewed - see discussion in A/P where applicable  DG Chest Portable 1 View Result Date: 08/19/2023 CLINICAL DATA:  Shortness of breath, chest pain. EXAM: PORTABLE CHEST  1 VIEW COMPARISON:  June 26, 2023. FINDINGS: Stable cardiomegaly. No acute pulmonary disease is noted. Status post right shoulder arthroplasty. IMPRESSION: No acute abnormality seen. Electronically Signed   By: Lupita Raider M.D.   On: 08/19/2023 14:06    EKG: Independently reviewed.  Afib with rate 129; no evidence of acute ischemia   Labs on Admission: I have personally reviewed the available labs and imaging studies at  the time of the admission.  Pertinent labs:    Na++ 130 Glucose 283 Albumin 3.2 BNP 248.3 HS troponin 9, 10 Urine culture 2/6, 3/4 + Enterococcus faecalis,3/17 + GNR with results pending    Assessment and Plan: Principal Problem:   Atrial fibrillation with RVR (HCC) Active Problems:   Chronic heart failure with preserved ejection fraction (HFpEF) (HCC)   UTI (urinary tract infection)   Bipolar 1 disorder (HCC)   OSA (obstructive sleep apnea)   Mild intermittent asthma   CAD (coronary artery disease)   Pure hypercholesterolemia   Primary hypertension   Type 2 diabetes mellitus with hyperglycemia, with long-term current use of insulin (HCC)    Afib with RVR Likely with recurrent/intractable UTI as the driver Started on Cardizem drip in the ER Improved rate control Will give tonight's dose of metoprolol early and attempt to turn off drip For now, observation in progressive care; hopefully can change to telemetry CP/SOB is likely associated with RVR Troponin negative x 2 If not improving, she will need cardiology consult Continue Eliquis  UTI 3 back to back positive cultures, patient reports persistent symptoms Discussed with pharmacy, who recommends treatment with Unasyn while culture is pending If not improving, recommend changing to Cipro Culture from 3/17 is pending and will also order culture here today Will order renal US  Bipolar d/o Continue Abilify, doxepin, escitalopram, hydroxyzine, Ativan, Trileptal Belsomra is not on formulary so will hold  Chronic pain -I have reviewed this patient in the [place] Controlled Substances Reporting System.  He is receiving medications from only one provider and appears to be taking them as prescribed. -I have reviewed this patient in the [place] Controlled Substances Reporting System.  He is receiving medications from two providers but appears to be taking them as prescribed. -He is not at particularly high risk of opioid misuse,  diversion, or overdose.  Continue home Oxy + Lyrica without plan for escalation  CAD/Chronic diastolic CHF Appears compensated at this time Last Echo was 01/14/23 with preserved EF, LA severely dilated, RA mildly dilated  HTN Continue losartan  HLD Continue atorvastatin  OSA Will order CPAP  DM Last A1c was 6.8, good control Continue Lantus Hold metformin Will cover with moderate-scale SSI  Gout Continue allopurinol  Asthma/COPD Continue Albuterol, Advair (Dulera per formulary) On chronic O2, prn during daytime and continuous qhs  Obesity BMI 33.98 Ongoing weight loss should be encouraged Hold Mounjaro while inpatient Outpatient PCP/bariatric medicine/bariatric surgery f/u encouraged    Advance Care Planning:   Code Status: Full Code - Code status was discussed with the patient and/or family at the time of admission.  The patient would want to receive full resuscitative measures at this time.   Consults: None  DVT Prophylaxis: Eliquis  Family Communication: Husband was present throughout evaluation  Severity of Illness: The appropriate patient status for this patient is OBSERVATION. Observation status is judged to be reasonable and necessary in order to provide the required intensity of service to ensure the patient's safety. The patient's presenting symptoms, physical exam findings, and initial radiographic  and laboratory data in the context of their medical condition is felt to place them at decreased risk for further clinical deterioration. Furthermore, it is anticipated that the patient will be medically stable for discharge from the hospital within 2 midnights of admission.   Author: Jonah Blue, MD 08/19/2023 6:54 PM  For on call review [email or web address].

## 2023-08-19 NOTE — ED Notes (Signed)
 Handoff Report given to [facility] RN

## 2023-08-20 DIAGNOSIS — Z7901 Long term (current) use of anticoagulants: Secondary | ICD-10-CM | POA: Diagnosis not present

## 2023-08-20 DIAGNOSIS — Z7983 Long term (current) use of bisphosphonates: Secondary | ICD-10-CM | POA: Diagnosis not present

## 2023-08-20 DIAGNOSIS — I272 Pulmonary hypertension, unspecified: Secondary | ICD-10-CM | POA: Diagnosis present

## 2023-08-20 DIAGNOSIS — I4891 Unspecified atrial fibrillation: Secondary | ICD-10-CM | POA: Diagnosis not present

## 2023-08-20 DIAGNOSIS — G2581 Restless legs syndrome: Secondary | ICD-10-CM | POA: Diagnosis present

## 2023-08-20 DIAGNOSIS — Z794 Long term (current) use of insulin: Secondary | ICD-10-CM | POA: Diagnosis not present

## 2023-08-20 DIAGNOSIS — J4520 Mild intermittent asthma, uncomplicated: Secondary | ICD-10-CM | POA: Diagnosis present

## 2023-08-20 DIAGNOSIS — N390 Urinary tract infection, site not specified: Secondary | ICD-10-CM | POA: Diagnosis present

## 2023-08-20 DIAGNOSIS — Z7951 Long term (current) use of inhaled steroids: Secondary | ICD-10-CM | POA: Diagnosis not present

## 2023-08-20 DIAGNOSIS — Z9981 Dependence on supplemental oxygen: Secondary | ICD-10-CM | POA: Diagnosis not present

## 2023-08-20 DIAGNOSIS — Z6833 Body mass index (BMI) 33.0-33.9, adult: Secondary | ICD-10-CM | POA: Diagnosis not present

## 2023-08-20 DIAGNOSIS — G894 Chronic pain syndrome: Secondary | ICD-10-CM | POA: Diagnosis present

## 2023-08-20 DIAGNOSIS — E7800 Pure hypercholesterolemia, unspecified: Secondary | ICD-10-CM | POA: Diagnosis present

## 2023-08-20 DIAGNOSIS — I5032 Chronic diastolic (congestive) heart failure: Secondary | ICD-10-CM | POA: Diagnosis present

## 2023-08-20 DIAGNOSIS — M109 Gout, unspecified: Secondary | ICD-10-CM | POA: Diagnosis present

## 2023-08-20 DIAGNOSIS — K760 Fatty (change of) liver, not elsewhere classified: Secondary | ICD-10-CM | POA: Diagnosis present

## 2023-08-20 DIAGNOSIS — I2510 Atherosclerotic heart disease of native coronary artery without angina pectoris: Secondary | ICD-10-CM | POA: Diagnosis present

## 2023-08-20 DIAGNOSIS — E1165 Type 2 diabetes mellitus with hyperglycemia: Secondary | ICD-10-CM | POA: Diagnosis present

## 2023-08-20 DIAGNOSIS — I4811 Longstanding persistent atrial fibrillation: Secondary | ICD-10-CM | POA: Diagnosis present

## 2023-08-20 DIAGNOSIS — I110 Hypertensive heart disease with heart failure: Secondary | ICD-10-CM | POA: Diagnosis present

## 2023-08-20 DIAGNOSIS — E1140 Type 2 diabetes mellitus with diabetic neuropathy, unspecified: Secondary | ICD-10-CM | POA: Diagnosis present

## 2023-08-20 DIAGNOSIS — E8729 Other acidosis: Secondary | ICD-10-CM | POA: Diagnosis present

## 2023-08-20 DIAGNOSIS — F319 Bipolar disorder, unspecified: Secondary | ICD-10-CM | POA: Diagnosis present

## 2023-08-20 DIAGNOSIS — E871 Hypo-osmolality and hyponatremia: Secondary | ICD-10-CM | POA: Diagnosis present

## 2023-08-20 DIAGNOSIS — J4489 Other specified chronic obstructive pulmonary disease: Secondary | ICD-10-CM | POA: Diagnosis present

## 2023-08-20 LAB — BLOOD GAS, VENOUS
Acid-Base Excess: 4.4 mmol/L — ABNORMAL HIGH (ref 0.0–2.0)
Bicarbonate: 31.6 mmol/L — ABNORMAL HIGH (ref 20.0–28.0)
Drawn by: 64724
O2 Saturation: 86.9 %
Patient temperature: 36.6
pCO2, Ven: 59 mmHg (ref 44–60)
pH, Ven: 7.34 (ref 7.25–7.43)
pO2, Ven: 52 mmHg — ABNORMAL HIGH (ref 32–45)

## 2023-08-20 LAB — URINE CULTURE: Culture: NO GROWTH

## 2023-08-20 LAB — GLUCOSE, CAPILLARY
Glucose-Capillary: 236 mg/dL — ABNORMAL HIGH (ref 70–99)
Glucose-Capillary: 182 mg/dL — ABNORMAL HIGH (ref 70–99)

## 2023-08-20 LAB — CBC
HCT: 36.1 % (ref 36.0–46.0)
Hemoglobin: 11.8 g/dL — ABNORMAL LOW (ref 12.0–15.0)
MCH: 30.5 pg (ref 26.0–34.0)
MCHC: 32.7 g/dL (ref 30.0–36.0)
MCV: 93.3 fL (ref 80.0–100.0)
Platelets: 232 10*3/uL (ref 150–400)
RBC: 3.87 MIL/uL (ref 3.87–5.11)
RDW: 14.6 % (ref 11.5–15.5)
WBC: 8.8 10*3/uL (ref 4.0–10.5)
nRBC: 0 % (ref 0.0–0.2)

## 2023-08-20 LAB — CBG MONITORING, ED
Glucose-Capillary: 140 mg/dL — ABNORMAL HIGH (ref 70–99)
Glucose-Capillary: 203 mg/dL — ABNORMAL HIGH (ref 70–99)

## 2023-08-20 LAB — BASIC METABOLIC PANEL
Anion gap: 12 (ref 5–15)
BUN: 18 mg/dL (ref 8–23)
CO2: 23 mmol/L (ref 22–32)
Calcium: 8.4 mg/dL — ABNORMAL LOW (ref 8.9–10.3)
Chloride: 96 mmol/L — ABNORMAL LOW (ref 98–111)
Creatinine, Ser: 0.87 mg/dL (ref 0.44–1.00)
GFR, Estimated: >60 mL/min (ref >60)
Glucose, Bld: 153 mg/dL — ABNORMAL HIGH (ref 70–99)
Potassium: 4.1 mmol/L (ref 3.5–5.1)
Sodium: 131 mmol/L — ABNORMAL LOW (ref 135–145)

## 2023-08-20 LAB — MRSA NEXT GEN BY PCR, NASAL: MRSA by PCR Next Gen: NOT DETECTED

## 2023-08-20 MED ORDER — SULFAMETHOXAZOLE-TRIMETHOPRIM 800-160 MG PO TABS: 1.0000 | ORAL_TABLET | Freq: Two times a day (BID) | ORAL | 0 refills | Status: DC

## 2023-08-20 NOTE — Addendum Note (Signed)
 Addended by: Bennie Pierini on: 08/20/2023 04:33 PM   Modules accepted: Orders

## 2023-08-20 NOTE — ED Notes (Signed)
 Pt is somnolent, often falling asleep in the middle of her sentence. Medications effecting wakefulness held at this time

## 2023-08-20 NOTE — Hospital Course (Addendum)
 65 yof w/ A fib, bipolar d/o, CAD, chronic diastolic CHF, chronic back pain, HTN, HLD, NASH,OSA, pulmonary HTN, and DM who presented on 08/19/23 with chest pain and shortness of breath, initially feeling poorly x 2 weeks,had a urinary infection-for his meds were changed "culture was still bad."  Patient was admitted for A-fib with RVR, ongoing UTI. Patient endorsed having intermittent chest pain for a week and a half and shortness of breath, uses nocturnal oxygen and as needed during daytime She has been having urinary symptoms and last urine culture on  2/ /6 showed Enterococcus treated with Macrobid x 5 days.  Repeat culture on 3/4 also with Enterococcus, treated with Augmentin x 7 days.  Repeat urine culture on 3/17 colonic is pending with prelim GNR. In the ED EKG with A-fib heart rate 130s placed on Cardizem drip, given Lasix chest x-ray negative. Renal ultrasound was unremarkable Urine culture from 3/17 grew Proteus 10,000-24,000 cfu-sensitivie to Augmentin/ampicillin Repeat urine culture on admission negative.  Patient has remained hemodynamically stable heart rate controlled on home regimen At this time she is stable for discharge home on oral antibiotics.

## 2023-08-20 NOTE — Evaluation (Signed)
 Occupational Therapy Evaluation Patient Details Name: Amber Stephenson MRN: [id number] DOB: 1945/12/19 Today's Date: 08/20/2023   History of Present Illness   Pt is a 78 yo female who presented on 08/19/23 with chest pain and shortness of breath, initially feeling poorly x 2 weeks.  Patient was admitted for A-fib with RVR, ongoing UTI.   A fib, bipolar d/o, CAD, chronic diastolic CHF, chronic back pain, HTN, HLD, NASH,OSA, pulmonary HTN, and DM.     Clinical Impressions Pt currently at min assist level overall for functional transfers with hand held assist and for selfcare sit to stand.  Oxygen sats at 97% or better at rest on room air but did decrease down to 87% with mobility, requiring supplementation of 2Ls to increase back above 92%.  HR increasing up to 122 during ambulation.  Pt with some dizziness with sitting but HR elevating from 113/75 in supine up to around 139/78 in sitting.  Pt with good BUE strength and prior to admission, she was living with her spouse and was independent with all ADLs and simple homemanagement tasks.  Feel based on current levels of assist she will benefit from acute care OT to help increase overall independence so she can return home safely with assist from her spouse as needed.  Recommend HHOT for continued progression back to baseline.       If plan is discharge home, recommend the following:   A little help with walking and/or transfers;A little help with bathing/dressing/bathroom;Assistance with cooking/housework     Functional Status Assessment   Patient has had a recent decline in their functional status and demonstrates the ability to make significant improvements in function in a reasonable and predictable amount of time.     Equipment Recommendations   None recommended by OT      Precautions/Restrictions   Precautions Precautions: Fall Recall of Precautions/Restrictions: Intact Restrictions Weight Bearing Restrictions Per Provider  Order: No     Mobility Bed Mobility Overal bed mobility: Needs Assistance Bed Mobility: Supine to Sit, Sit to Supine     Supine to sit: Min assist     General bed mobility comments: Min assist to bring trunk up to sitting with min assist to bring LEs back into the elevated stretcher.    Transfers Overall transfer level: Needs assistance Equipment used: 1 person hand held assist Transfers: Sit to/from Stand, Bed to chair/wheelchair/BSC Sit to Stand: Min assist     Step pivot transfers: Min assist     General transfer comment: Min hand held assist for mobility in the hallway.      Balance Overall balance assessment: Needs assistance   Sitting balance-Leahy Scale: Fair Sitting balance - Comments: Pt able to sit statically without assistance but initially min guard secondary to height of stretcher and feet not touching the ground as well as exhibiting slight dizziness. Postural control: Posterior lean Standing balance support: During functional activity, Single extremity supported Standing balance-Leahy Scale: Poor Standing balance comment: Pt needs UE support for balance during mobility.                           ADL either performed or assessed with clinical judgement   ADL Overall ADL's : Needs assistance/impaired Eating/Feeding: Independent;Sitting   Grooming: Standing;Minimal assistance Grooming Details (indicate cue type and reason): simulated Upper Body Bathing: Set up;Sitting   Lower Body Bathing: Minimal assistance;Sit to/from stand   Upper Body Dressing : Set up;Sitting   Lower Body Dressing:  Moderate assistance;Sit to/from stand   Toilet Transfer: Minimal assistance;Ambulation;Regular Toilet;Grab bars Toilet Transfer Details (indicate cue type and reason): simulated Toileting- Clothing Manipulation and Hygiene: Minimal assistance;Sit to/from stand       Functional mobility during ADLs: Minimal assistance (ambulation hand held assist on the  right) General ADL Comments: Pt' s O2 sats at 97% at rest on room air resting in supine.  This decreased to 92% with sitting EOB and then down to 87-89% on room air with mobility in the hallway.  BP in supine at 113/75.  Pt with some reports of dizzines once sitting but BP again stable in the 139 range systolically.    Pt with occasional LOB when ambulating in the hallway requiring min assist to correct.  HR elevating up to 122 during mobility.     Vision Baseline Vision/History: 0 No visual deficits Ability to See in Adequate Light: 0 Adequate Patient Visual Report: No change from baseline Vision Assessment?: Wears glasses for reading     Perception Perception: Within Functional Limits       Praxis Praxis: WFL       Pertinent Vitals/Pain Pain Assessment Pain Assessment: 0-10 Pain Score: 5  Pain Location: lower back pain Pain Descriptors / Indicators: Discomfort Pain Intervention(s): Limited activity within patient's tolerance, Repositioned, Monitored during session     Extremity/Trunk Assessment Upper Extremity Assessment Upper Extremity Assessment: Overall WFL for tasks assessed   Lower Extremity Assessment Lower Extremity Assessment: Defer to PT evaluation   Cervical / Trunk Assessment Cervical / Trunk Assessment: Normal   Communication Communication Communication: No apparent difficulties   Cognition Arousal: Lethargic Behavior During Therapy: WFL for tasks assessed/performed Cognition: No apparent impairments                               Following commands: Intact                  Home Living Family/patient expects to be discharged to:: Private residence Living Arrangements: Spouse/significant other Available Help at Discharge: Family;Available 24 hours/day Type of Home: House Home Access: Stairs to enter [employer] of Steps: 3-4 Entrance Stairs-Rails: Left Home Layout: Able to live on main level with bedroom/bathroom;Two  level     Bathroom Shower/Tub: Producer, television/film/video: Standard     Home Equipment: Agricultural consultant (2 wheels);Cane - single point;Shower seat - built in;Grab bars - toilet;Grab bars - tub/shower;Other (comment) (has access to 3 wheeled walker if needed)   Additional Comments: Uses 3 Ls O2 as needed and at night.          OT Problem List: Decreased strength;Decreased knowledge of use of DME or AE;Decreased activity tolerance;Impaired balance (sitting and/or standing);Pain;Cardiopulmonary status limiting activity   OT Treatment/Interventions: Self-care/ADL training;Balance training;Therapeutic activities;Neuromuscular education;Patient/family education;DME and/or AE instruction;Energy conservation      OT Goals(Current goals can be found in the care plan section)   Acute Rehab OT Goals Patient Stated Goal: Pt did not state but agreeable to working with OT OT Goal Formulation: With patient Time For Goal Achievement: 09/03/23 Potential to Achieve Goals: Good   OT Frequency:  Min 2X/week       AM-PAC OT "6 Clicks" Daily Activity     Outcome Measure Help from another person eating meals?: None Help from another person taking care of personal grooming?: A Little Help from another person toileting, which includes using toliet, bedpan, or urinal?: A Little Help from  another person bathing (including washing, rinsing, drying)?: A Little Help from another person to put on and taking off regular upper body clothing?: A Little Help from another person to put on and taking off regular lower body clothing?: A Little 6 Click Score: 19   End of Session Equipment Utilized During Treatment: Gait belt;Oxygen Nurse Communication: Mobility status  Activity Tolerance: Patient tolerated treatment well Patient left: in bed;with call bell/phone within reach  OT Visit Diagnosis: Unsteadiness on feet (R26.81);Repeated falls (R29.6);Muscle weakness (generalized) (M62.81);Pain Pain -  Right/Left:  (back pain)                Time: 5621-3086 OT Time Calculation (min): 35 min Charges:  OT General Charges $OT Visit: 1 Visit OT Evaluation $OT Eval Moderate Complexity: 1 Mod OT Treatments $Self Care/Home Management : 8-22 mins  Perrin Maltese, OTR/L Acute Rehabilitation Services  Office [phone] 08/20/2023

## 2023-08-20 NOTE — ED Notes (Signed)
Pt CBG 203 

## 2023-08-20 NOTE — Progress Notes (Signed)
   08/20/23 2007  BiPAP/CPAP/SIPAP  BiPAP/CPAP/SIPAP Pt Type Adult  Reason BIPAP/CPAP not in use Non-compliant  BiPAP/CPAP /SiPAP Vitals  Pulse Rate 92  Resp 18  SpO2 98 %  Bilateral Breath Sounds Clear;Diminished  MEWS Score/Color  MEWS Score 0  MEWS Score Color [place] Si

## 2023-08-20 NOTE — Progress Notes (Signed)
 PROGRESS NOTE Amber Stephenson  WGN:[id number] DOB: 03-25-1946 DOA: 08/19/2023 PCP: Mechele Claude, MD  Brief Narrative/Hospital Course: 21 yof w/ A fib, bipolar d/o, CAD, chronic diastolic CHF, chronic back pain, HTN, HLD, NASH,OSA, pulmonary HTN, and DM who presented on 08/19/23 with chest pain and shortness of breath, initially feeling poorly x 2 weeks,had a urinary infection-for his meds were changed "culture was still bad."  Patient was admitted for A-fib with RVR, ongoing UTI. Patient endorsed having intermittent chest pain for a week and a half and shortness of breath, uses nocturnal oxygen and as needed during daytime She has been having urinary symptoms and last urine culture on  2/ /6 showed Enterococcus treated with Macrobid x 5 days.  Repeat culture on 3/4 also with Enterococcus, treated with Augmentin x 7 days.  Repeat urine culture on 3/17 colonic is pending with prelim GNR. In the ED EKG with A-fib heart rate 130s placed on Cardizem drip, given Lasix chest x-ray negative. Renal ultrasound was unremarkable       Subjective: Seen examined Overnight afebrile BP stable. Labs mild hyponatremia 131 stable CBC urine culture pending No more dysuria today, overall feeling better heart rate in low 100 in a afib Slightly sleepy but able to wake up and interact well reports she woke up early   Assessment and Plan: Principal Problem:   Atrial fibrillation with RVR (HCC) Active Problems:   Chronic heart failure with preserved ejection fraction (HFpEF) (HCC)   UTI (urinary tract infection)   Bipolar 1 disorder (HCC)   OSA (obstructive sleep apnea)   Mild intermittent asthma   CAD (coronary artery disease)   Pure hypercholesterolemia   Primary hypertension   Type 2 diabetes mellitus with hyperglycemia, with long-term current use of insulin (HCC)   A-fib with RVR: Rate well-controlled currently. Off Cardizem drip and back on metoprolol 50 twice daily, continue home Eliquis-monitor and  daily and adjust meds  Recurrent UTI with Enterococcus: Currently on Unasyn pending further urine culture report  from 3/17and ultrasound unremarkable.  Symptomatically feeling better  Bipolar d/o Mood stable, somewhat drowsy, continue Abilify, doxepin, escitalopram, hydroxyzine, Ativan, Trileptal Belsomra is not on formulary so holding.   Somewhat somnolent this morning hold psychotropic medication, check VBG given her OSA for co2 retention   Chronic pain On admission N[facility] was reviewed -"He is receiving medications from only one provider and appears to be taking them as prescribed. -have reviewed this patient in the [place] Controlled Substances Reporting System.  He is receiving medications from two providers but appears to be taking them as prescribed"He is not at particularly high risk of opioid misuse, diversion, or overdose.  Continue home Oxy + Lyrica without plan for escalation and hold for sedation   CAD/Chronic diastolic CHF Compensated currently.  Last Echo was 01/14/23 with preserved EF, LA severely dilated, RA mildly dilated   HTN Controlled, continue losartan   HLD Continue atorvastatin   OSA Will order CPAP nightly   DM Holding home metformin, last A1c 6.8, controlled.  Continue sliding scale for now continue Lantus   Recent Labs  Lab 08/19/23 1701 08/19/23 2234 08/20/23 0735  GLUCAP 167* 174* 140*    Gout Continue allopurinol   Asthma/COPD Continue Albuterol, Advair (Dulera per formulary) On chronic O2, prn during daytime and continuous qhs  DVT prophylaxis:  Code Status:   Code Status: Full Code Family Communication: plan of care discussed with patient at bedside. Patient status is: Remains hospitalized because of severity of illness Level of  care: Telemetry Cardiac   Dispo: The patient is from: Home            Anticipated disposition: Requesting PT OT evaluation Objective: Vitals last 24 hrs: Vitals:   08/20/23 0500 08/20/23 0600 08/20/23 0737  08/20/23 1000  BP: 119/69 109/65 115/73 116/77  Pulse: 75 88 92 90  Resp: 17 16 18  (!) 24  Temp:   98.4 F (36.9 C)   TempSrc:   Oral   SpO2: 100% 100% 100% 98%   Weight change:   Physical Examination: General exam: alert awake,at baseline, older than stated age HEENT:Oral mucosa moist, Ear/Nose WNL grossly Respiratory system: Bilaterally clear BS,no use of accessory muscle Cardiovascular system: S1 & S2 +, No JVD. Gastrointestinal system: Abdomen soft,NT,ND, BS+ Nervous System: Alert, awake, moving all extremities,and following commands. Extremities: LE edema neg,distal peripheral pulses palpable and warm.  Skin: No rashes,no icterus. MSK: Normal muscle bulk,tone, power   Medications reviewed:  Scheduled Meds:  allopurinol  100 mg Oral Daily   apixaban  5 mg Oral BID   ARIPiprazole  2 mg Oral QHS   atorvastatin  80 mg Oral QPM   clotrimazole  10 mg Oral BID   doxepin  10 mg Oral QHS   escitalopram  10 mg Oral Daily   hydrOXYzine  25 mg Oral TID   insulin aspart  0-15 Units Subcutaneous TID WC   insulin aspart  0-5 Units Subcutaneous QHS   insulin glargine  32 Units Subcutaneous BID   loratadine  10 mg Oral QPM   LORazepam  0.5 mg Oral Daily   LORazepam  1 mg Oral QHS   losartan  25 mg Oral Daily   magnesium oxide  400 mg Oral Daily   metoprolol tartrate  50 mg Oral BID   mometasone-formoterol  2 puff Inhalation BID   OXcarbazepine  150 mg Oral BID   pantoprazole  40 mg Oral Daily   potassium chloride  10 mEq Oral Daily   pregabalin  200 mg Oral BID   rOPINIRole  1 mg Oral QHS   senna-docusate  1 tablet Oral QHS   sodium chloride flush  3 mL Intravenous Q12H   Continuous Infusions:  ampicillin-sulbactam (UNASYN) IV 3 g (08/20/23 1101)    Diet Order             Diet heart healthy/carb modified Room service appropriate? Yes; Fluid consistency: Thin  Diet effective now                  No intake or output data in the 24 hours ending 08/20/23 1139 Net IO  Since Admission: No IO data has been entered for this period [08/20/23 1139]  Wt Readings from Last 3 Encounters:  08/17/23 84.3 kg  08/04/23 82.6 kg  07/13/23 82.1 kg     Unresulted Labs (From admission, onward)     Start     Ordered   08/19/23 1836  Urine Culture (for pregnant, neutropenic or urologic patients or patients with an indwelling urinary catheter)  (Urine Labs)  Once,   R       Question:  Indication  Answer:  Dysuria   08/19/23 1835          Data Reviewed: I have personally reviewed following labs and imaging studies CBC: Recent Labs  Lab 08/19/23 1036 08/20/23 0449  WBC 8.0 8.8  HGB 11.2* 11.8*  HCT 33.2* 36.1  MCV 91.7 93.3  PLT 219 232   Basic Metabolic Panel:  Recent Labs  Lab 08/19/23 1036 08/20/23 0449  NA 130* 131*  K 4.0 4.1  CL 96* 96*  CO2 22 23  GLUCOSE 283* 153*  BUN 13 18  CREATININE 0.71 0.87  CALCIUM 9.0 8.4*   GFR: Estimated Creatinine Clearance: 55.4 mL/min (by C-G formula based on SCr of 0.87 mg/dL). Liver Function Tests:  Recent Labs  Lab 08/19/23 1036  AST 20  ALT 17  ALKPHOS 100  BILITOT 0.3  PROT 6.8  ALBUMIN 3.2*   No results for input(s): "HGBA1C" in the last 72 hours. Recent Labs  Lab 08/19/23 1701 08/19/23 2234 08/20/23 0735  GLUCAP 167* 174* 140*   No results for input(s): "PROCALCITON", "LATICACIDVEN" in the last 168 hours. Recent Results (from the past 240 hours)  Urine Culture     Status: Abnormal (Preliminary result)   Collection Time: 08/17/23  2:19 PM   Specimen: Urine   UR  Result Value Ref Range Status   Urine Culture, Routine Preliminary report (A)  Preliminary   Organism ID, Bacteria Gram negative rods (A)  Preliminary    Comment: 10,000-25,000 colony forming units per mL   ORGANISM ID, BACTERIA Comment  Preliminary    Comment: Microbiological testing to rule out the presence of possible pathogens is in progress.   Microscopic Examination     Status: Abnormal   Collection Time: 08/17/23   2:19 PM   Urine  Result Value Ref Range Status   WBC, UA None seen 0 - 5 /hpf Final   RBC, Urine 0-2 0 - 2 /hpf Final   Epithelial Cells (non renal) 0-10 0 - 10 /hpf Final   Renal Epithel, UA None seen None seen /hpf Final   Bacteria, UA Few (A) None seen/Few Final   Yeast, UA None seen None seen Final    Antimicrobials/Microbiology: Anti-infectives (From admission, onward)    Start     Dose/Rate Route Frequency Ordered Stop   08/19/23 1730  Ampicillin-Sulbactam (UNASYN) 3 g in sodium chloride 0.9 % 100 mL IVPB        3 g 200 mL/hr over 30 Minutes Intravenous Every 6 hours 08/19/23 1720     08/19/23 1630  ciprofloxacin (CIPRO) IVPB 400 mg  Status:  Discontinued       Note to Pharmacy: 3 recent cultures with Enterococcus faecalis (last culture with GNR, sensitivities pending) with persistent symptoms   400 mg 200 mL/hr over 60 Minutes Intravenous Every 12 hours 08/19/23 1622 08/19/23 1720         Component Value Date/Time   SDES  07/09/2023 1131    URINE, CLEAN CATCH Performed at [facility], [address], [place], [place] [address]    SPECREQUEST  07/09/2023 1131    NONE Performed at [facility], [address], [place], [place] [address]    CULT 10,000 COLONIES/mL ENTEROCOCCUS FAECALIS (A) 07/09/2023 1131   REPTSTATUS 07/11/2023 FINAL 07/09/2023 1131     Radiology Studies: US RENAL Result Date: 08/19/2023 CLINICAL DATA:  Urinary tract infection EXAM: RENAL / URINARY TRACT ULTRASOUND COMPLETE COMPARISON:  06/26/2023, 09/01/2022 FINDINGS: Right Kidney: Renal measurements: 10.0 x 4.5 x 4.1 cm = volume: 96.1 mL. Echogenicity within normal limits. No mass or hydronephrosis visualized. Left Kidney: Renal measurements: 10.4 x 4.6 x 4.2 cm = volume: 104.1 mL. Echogenicity within normal limits. No mass or hydronephrosis visualized. Bladder: Appears normal for degree of bladder distention. Other: None. IMPRESSION: 1. Unremarkable  renal ultrasound. Electronically Signed  By: Sharlet Salina M.D.   On: 08/19/2023 19:55   DG Chest Portable 1 View Result Date: 08/19/2023 CLINICAL DATA:  Shortness of breath, chest pain. EXAM: PORTABLE CHEST 1 VIEW COMPARISON:  June 26, 2023. FINDINGS: Stable cardiomegaly. No acute pulmonary disease is noted. Status post right shoulder arthroplasty. IMPRESSION: No acute abnormality seen. Electronically Signed   By: Lupita Raider M.D.   On: 08/19/2023 14:06     LOS: 0 days   Total time spent in review of labs and imaging, patient evaluation, formulation of plan, documentation and communication with family: 35 minutes  Lanae Boast, MD  [facility]  08/20/2023, 11:39 AM

## 2023-08-21 ENCOUNTER — Other Ambulatory Visit (HOSPITAL_COMMUNITY): Payer: Self-pay

## 2023-08-21 DIAGNOSIS — I4891 Unspecified atrial fibrillation: Secondary | ICD-10-CM | POA: Diagnosis not present

## 2023-08-21 LAB — BASIC METABOLIC PANEL
Anion gap: 12 (ref 5–15)
BUN: 20 mg/dL (ref 8–23)
CO2: 24 mmol/L (ref 22–32)
Calcium: 8.4 mg/dL — ABNORMAL LOW (ref 8.9–10.3)
Chloride: 93 mmol/L — ABNORMAL LOW (ref 98–111)
Creatinine, Ser: 0.81 mg/dL (ref 0.44–1.00)
GFR, Estimated: >60 mL/min (ref >60)
Glucose, Bld: 224 mg/dL — ABNORMAL HIGH (ref 70–99)
Potassium: 4.4 mmol/L (ref 3.5–5.1)
Sodium: 129 mmol/L — ABNORMAL LOW (ref 135–145)

## 2023-08-21 LAB — CBC
HCT: 30.8 % — ABNORMAL LOW (ref 36.0–46.0)
Hemoglobin: 10.2 g/dL — ABNORMAL LOW (ref 12.0–15.0)
MCH: 30.7 pg (ref 26.0–34.0)
MCHC: 33.1 g/dL (ref 30.0–36.0)
MCV: 92.8 fL (ref 80.0–100.0)
Platelets: 185 10*3/uL (ref 150–400)
RBC: 3.32 MIL/uL — ABNORMAL LOW (ref 3.87–5.11)
RDW: 14.5 % (ref 11.5–15.5)
WBC: 8.2 10*3/uL (ref 4.0–10.5)
nRBC: 0 % (ref 0.0–0.2)

## 2023-08-21 LAB — GLUCOSE, CAPILLARY
Glucose-Capillary: 179 mg/dL — ABNORMAL HIGH (ref 70–99)
Glucose-Capillary: 206 mg/dL — ABNORMAL HIGH (ref 70–99)

## 2023-08-21 MED ORDER — AMOXICILLIN 500 MG PO CAPS
500.0000 mg | ORAL_CAPSULE | Freq: Three times a day (TID) | ORAL | 0 refills | Status: AC
  Filled 2023-08-21: qty 9, 3d supply, fill #0

## 2023-08-21 NOTE — Consult Note (Addendum)
 [facility] Liaison Consult Note    08/21/2023  Amber Stephenson April 20, 1946 [id number]  [facility] Patient:  Extreme High Risk for unplanned readmission with 2 hospital admissions in 6 months   Primary Care Provider:  Mechele Claude, MD, with [facility], this provider is listed to provide the community transition of care follow up and [facility] calls  Insurance: Medicare ACO [place]  [facility] Liaison met patient at [facility].  Came to bedside and patient has discharged for home.  Patient admitted with Atrial Fib with RVR.  Patient is currently active with [facility] for care coordination services.  Patient has been engaged by a [employer]  The community based plan of care has focused on disease management and community resource support.    Patient to have a post hospital call and will be evaluated for assessments and disease process education.  Noted of having a follow up appointment with [facility] RN 08/25/23  Plan: Will follow up with [facility] RN CC regarding hospitalization.  Alerted of upcoming appointment with patient noted.  Of note, [facility] services does not replace or interfere with any services that are needed or arranged by inpatient [facility] care management team.   Charlesetta Shanks, RN, BSN, CCM [place]  [facility] Liaison Direct Dial: [phone] or secure chat Email: [email or web address]

## 2023-08-21 NOTE — Progress Notes (Cosign Needed)
 Nurse unable to return Lorazepam 0.5mg  before patient discharged from Hospital today 08/21/23. Charge Nurse Trula Ore and pharmacy aware. Will return medication back to pharmacy. Note to be cosigned by Jennette Bill nurse.

## 2023-08-21 NOTE — Discharge Summary (Signed)
 Physician Discharge Summary  Amber Stephenson NWG:[id number] DOB: January 20, 1946 DOA: 08/19/2023  PCP: Mechele Claude, MD  Admit date: 08/19/2023 Discharge date: 08/21/2023 Recommendations for Outpatient Follow-up:  Follow up with PCP in 1 weeks-call for appointment Please obtain BMP/CBC in one week   Discharge Dispo: Home Discharge Condition: Stable Code Status:   Code Status: Full Code Diet recommendation:  Diet Order             Diet heart healthy/carb modified Room service appropriate? Yes; Fluid consistency: Thin  Diet effective now                    Brief/Interim Summary: 78 yof w/ A fib, bipolar d/o, CAD, chronic diastolic CHF, chronic back pain, HTN, HLD, NASH,OSA, pulmonary HTN, and DM who presented on 08/19/23 with chest pain and shortness of breath, initially feeling poorly x 2 weeks,had a urinary infection-for his meds were changed "culture was still bad."  Patient was admitted for A-fib with RVR, ongoing UTI. Patient endorsed having intermittent chest pain for a week and a half and shortness of breath, uses nocturnal oxygen and as needed during daytime She has been having urinary symptoms and last urine culture on  2/ /6 showed Enterococcus treated with Macrobid x 5 days.  Repeat culture on 3/4 also with Enterococcus, treated with Augmentin x 7 days.  Repeat urine culture on 3/17 colonic is pending with prelim GNR. In the ED EKG with A-fib heart rate 130s placed on Cardizem drip, given Lasix chest x-ray negative. Renal ultrasound was unremarkable Urine culture from 3/17 grew Proteus 10,000-24,000 cfu-sensitivie to Augmentin/ampicillin Repeat urine culture on admission negative.  Patient has remained hemodynamically stable heart rate controlled on home regimen At this time she is stable for discharge home on oral antibiotics.    Discharge Diagnoses:  Principal Problem:   Atrial fibrillation with RVR (HCC) Active Problems:   Chronic heart failure with preserved ejection  fraction (HFpEF) (HCC)   UTI (urinary tract infection)   Bipolar 1 disorder (HCC)   OSA (obstructive sleep apnea)   Mild intermittent asthma   CAD (coronary artery disease)   Pure hypercholesterolemia   Primary hypertension   Type 2 diabetes mellitus with hyperglycemia, with long-term current use of insulin (HCC)  Recurrent UTI Urine culture from 3/17 grew Proteus 10,000-24,000 cfu-sensitivie to Augmentin/ampicillin Repeat urine culture on admission negative.  Patient has remained hemodynamically stable heart rate controlled on home regimen At this time she is stable for discharge home on oral antibiotics.  Longstanding persistent A-fib with RVR: Rate well-controlled currently. Off Cardizem drip and back on metoprolol and Eliquis continue the same   Bipolar d/o Mood stable, somewhat drowsy, continue Abilify, doxepin, escitalopram, hydroxyzine, Ativan, Trileptal Belsomra is not on formulary so holding.   Somewhat somnolent this morning hold psychotropic medication, check VBG given her OSA for co2 retention   Chronic pain On admission [facility] was reviewed -"He is receiving medications from only one provider and appears to be taking them as prescribed. -have reviewed this patient in the [place] Controlled Substances Reporting System.  He is receiving medications from two providers but appears to be taking them as prescribed"He is not at particularly high risk of opioid misuse, diversion, or overdose. Continue home meds with oxy Lyrica as tolerated   CAD/Chronic diastolic CHF Compensated currently.  Last Echo was 01/14/23 with preserved EF, LA severely dilated, RA mildly dilated   HTN Controlled, continue losartan   HLD Continue atorvastatin   OSA Cont CPAP nightly  DM with uncontrolled hyperglycemia On discharge resume home metformin/insulin, last A1c 6.8, controlled.  Managed with insulin inpatient   Gout Continue allopurinol   Asthma/COPD Continue Albuterol, Advair (Dulera per  formulary) On chronic O2, prn during daytime and continuous qhs  Class I obesity with BMI 33.4: will benefit with diabetes.  Healthy lifestyle.  Consults: none Subjective: Aaox3 ready to go home  Discharge Exam: Vitals:   08/21/23 0321 08/21/23 0730  BP: 133/80 123/62  Pulse: 89 78  Resp: 20 18  Temp: 98 F (36.7 C) 98 F (36.7 C)  SpO2: 98% 96%   General: Pt is alert, awake, not in acute distress Cardiovascular: RRR, S1/S2 +, no rubs, no gallops Respiratory: CTA bilaterally, no wheezing, no rhonchi Abdominal: Soft, NT, ND, bowel sounds + Extremities: no edema, no cyanosis  Discharge Instructions  Discharge Instructions     Amb referral to [facility]   Complete by: As directed    Discharge instructions   Complete by: As directed    Please call call MD or return to ER for similar or worsening recurring problem that brought you to hospital or if any fever,nausea/vomiting,abdominal pain, uncontrolled pain, chest pain,  shortness of breath or any other alarming symptoms.  Please follow-up your doctor as instructed in a week time and call the office for appointment.  Please avoid alcohol, smoking, or any other illicit substance and maintain healthy habits including taking your regular medications as prescribed.  You were cared for by a hospitalist during your hospital stay. If you have any questions about your discharge medications or the care you received while you were in the hospital after you are discharged, you can call the unit and ask to speak with the hospitalist on call if the hospitalist that took care of you is not available.  Once you are discharged, your primary care physician will handle any further medical issues. Please note that NO REFILLS for any discharge medications will be authorized once you are discharged, as it is imperative that you return to your primary care physician (or establish a relationship with a primary care physician if you do not have one)  for your aftercare needs so that they can reassess your need for medications and monitor your lab values   Increase activity slowly   Complete by: As directed       Allergies as of 08/21/2023       Reactions   Iodine Anaphylaxis   Ivp Dye [iodinated Contrast Media] Anaphylaxis, Swelling, Other (See Comments)   Throat closes   Latex Other (See Comments)   Latex tape pulls skin with it   Tape Other (See Comments)   Pulls off the skin, if latex   Tizanidine Other (See Comments)   Weakness, goofy, bad dreams        Medication List     TAKE these medications    albuterol 108 (90 Base) MCG/ACT inhaler Commonly known as: VENTOLIN HFA Inhale 2 puffs into the lungs every 4 (four) hours as needed for wheezing or shortness of breath.   allopurinol 100 MG tablet Commonly known as: ZYLOPRIM Take 100 mg by mouth daily.   amoxicillin 500 MG capsule Commonly known as: AMOXIL Take 1 capsule (500 mg total) by mouth 3 (three) times daily for 3 days.   apixaban 5 MG Tabs tablet Commonly known as: Eliquis Take 1 tablet (5 mg total) by mouth 2 (two) times daily.   ARIPiprazole 2 MG tablet Commonly known as: ABILIFY Take 2 mg  by mouth at bedtime.   atorvastatin 80 MG tablet Commonly known as: LIPITOR Take 1 tablet (80 mg total) by mouth daily. What changed: when to take this   Belsomra 20 MG Tabs Generic drug: Suvorexant Take 1 tablet (20 mg total) by mouth at bedtime as needed. What changed: reasons to take this   benzonatate 100 MG capsule Commonly known as: TESSALON TAKE 2 CAPSULES(200 MG) BY MOUTH THREE TIMES DAILY AS NEEDED FOR COUGH   clotrimazole 10 MG troche Commonly known as: MYCELEX Take 10 mg by mouth in the morning and at bedtime.   cyanocobalamin 1000 MCG tablet Commonly known as: VITAMIN B12 Take 1 tablet (1,000 mcg total) by mouth daily.   Dexcom G7 Sensor Misc CHANGE SENSOR EVERY 10 DAYS   doxepin 10 MG capsule Commonly known as: SINEQUAN Take 10 mg  by mouth at bedtime.   escitalopram 10 MG tablet Commonly known as: LEXAPRO Take 10 mg by mouth daily.   fluticasone-salmeterol 100-50 MCG/ACT Aepb Commonly known as: Advair Diskus Inhale 1 puff into the lungs 2 (two) times daily. What changed:  when to take this reasons to take this   hydrOXYzine 25 MG capsule Commonly known as: VISTARIL Take 25 mg by mouth 3 (three) times daily.   insulin lispro 100 UNIT/ML KwikPen Commonly known as: HumaLOG KwikPen Max daily 45 units What changed:  how much to take how to take this when to take this   Insulin Pen Needle 29G X Misc 1 Device by Does not apply route daily in the afternoon.   Lantus SoloStar 100 UNIT/ML Solostar Pen Generic drug: insulin glargine Inject 32 Units into the skin 2 (two) times daily.   levocetirizine 5 MG tablet Commonly known as: Xyzal Allergy 24HR Take 1 tablet (5 mg total) by mouth every evening. For itch   lidocaine 2 % solution Commonly known as: XYLOCAINE Use as directed 15 mLs in the mouth or throat every 4 (four) hours as needed for mouth pain.   LORazepam 0.5 MG tablet Commonly known as: ATIVAN Take 0.5-1 mg by mouth See admin instructions. Take 0.5 mg by mouth in the morning and 1 mg at bedtime   losartan 25 MG tablet Commonly known as: COZAAR Take 25 mg by mouth daily.   MAGNESIUM PO Take 1 tablet by mouth daily.   meclizine 25 MG tablet Commonly known as: ANTIVERT Take 1 tablet (25 mg total) by mouth 3 (three) times daily as needed for dizziness.   metFORMIN 500 MG 24 hr tablet Commonly known as: GLUCOPHAGE-XR Take 2 tablets (1,000 mg total) by mouth daily with breakfast.   Methocarbamol 1000 MG Tabs Take 1,000 mg by mouth every 6 (six) hours as needed for muscle spasms.   metoprolol tartrate 50 MG tablet Commonly known as: LOPRESSOR Take 1 tablet (50 mg total) by mouth 2 (two) times daily.   naloxone 4 MG/0.1ML Liqd nasal spray kit Commonly known as: NARCAN Place 1  spray into the nose daily as needed (for over dose).   nystatin powder Commonly known as: MYCOSTATIN/NYSTOP Apply 1 Application topically as needed (rash).   nystatin ointment Commonly known as: MYCOSTATIN Apply 1 Application topically 2 (two) times daily as needed (for rashes).   omeprazole 40 MG capsule Commonly known as: PRILOSEC Take 40 mg by mouth daily.   Omnipod 5 G7 Pods (Gen 5) Misc 1 Device by Does not apply route every other day.   ondansetron 4 MG tablet Commonly known as: Zofran Take 1 tablet (  4 mg total) by mouth every 8 (eight) hours as needed for nausea or vomiting. For cancer related nausea   OXcarbazepine 150 MG tablet Commonly known as: TRILEPTAL Take 1 tablet (150 mg total) by mouth 2 (two) times daily.   Oxycodone HCl 20 MG Tabs Take 1 tablet (20 mg total) by mouth 2 (two) times daily as needed. Do Not Fill Before 08/09/2023   potassium chloride 10 MEQ tablet Commonly known as: KLOR-CON Take 1 tablet (10 mEq total) by mouth daily.   pregabalin 200 MG capsule Commonly known as: LYRICA Take 200 mg by mouth 2 (two) times daily.   Prolia 60 MG/ML Sosy injection Generic drug: denosumab Inject 60 mg into the skin every 6 (six) months.   promethazine 25 MG tablet Commonly known as: PHENERGAN Take 0.5-1 tablets (12.5-25 mg total) by mouth every 6 (six) hours as needed for refractory nausea / vomiting (cancer related nausea/vomiting).   rOPINIRole 1 MG tablet Commonly known as: REQUIP Take 1 tablet (1 mg total) by mouth at bedtime. For leg cramps   senna-docusate 8.6-50 MG tablet Commonly known as: Senokot-S Take 1 tablet by mouth at bedtime.   spironolactone 25 MG tablet Commonly known as: ALDACTONE Take 0.5 tablets (12.5 mg total) by mouth daily.   tirzepatide 10 MG/0.5ML Pen Commonly known as: MOUNJARO Inject 10 mg into the skin once a week.   TYLENOL 500 MG tablet Generic drug: acetaminophen Take 500-1,000 mg by mouth every 6 (six) hours  as needed for mild pain (pain score 1-3) or headache.   Vitamin D (Cholecalciferol) 25 MCG (1000 UT) Tabs Take 1,000 Units by mouth daily in the afternoon.        Follow-up Information     Mechele Claude, MD Follow up in 1 week(s).   Specialty: Family Medicine Contact information: [address] [place] [address] [phone]                Allergies  Allergen Reactions   Iodine Anaphylaxis   Ivp Dye [Iodinated Contrast Media] Anaphylaxis, Swelling and Other (See Comments)    Throat closes    Latex Other (See Comments)    Latex tape pulls skin with it   Tape Other (See Comments)    Pulls off the skin, if latex   Tizanidine Other (See Comments)    Weakness, goofy, bad dreams    The results of significant diagnostics from this hospitalization (including imaging, microbiology, ancillary and laboratory) are listed below for reference.    Microbiology: Recent Results (from the past 240 hours)  Urine Culture     Status: Abnormal   Collection Time: 08/17/23  2:19 PM   Specimen: Urine   UR  Result Value Ref Range Status   Urine Culture, Routine Final report (A)  Final   Organism ID, Bacteria Proteus mirabilis (A)  Final    Comment: 10,000-25,000 colony forming units per mL   ORGANISM ID, BACTERIA Not applicable  Final   Antimicrobial Susceptibility Comment  Final    Comment:       ** S = Susceptible; I = Intermediate; R = Resistant **                    P = Positive; N = Negative             MICS are expressed in micrograms per mL    Antibiotic                 RSLT#1  RSLT#2    RSLT#3    RSLT#4 Amoxicillin/Clavulanic Acid    S Ampicillin                     S Cefazolin                      R Cefepime                       I Cefoxitin                      S Cefpodoxime                    S Ceftriaxone                    S Ciprofloxacin                  S Ertapenem                      S Gentamicin                     S Levofloxacin                    S Meropenem                      I Nitrofurantoin                 R Piperacillin/Tazobactam        S Tetracycline                   R Tobramycin                     S Trimethoprim/Sulfa             S   Microscopic Examination     Status: Abnormal   Collection Time: 08/17/23  2:19 PM   Urine  Result Value Ref Range Status   WBC, UA None seen 0 - 5 /hpf Final   RBC, Urine 0-2 0 - 2 /hpf Final   Epithelial Cells (non renal) 0-10 0 - 10 /hpf Final   Renal Epithel, UA None seen None seen /hpf Final   Bacteria, UA Few (A) None seen/Few Final   Yeast, UA None seen None seen Final  Urine Culture (for pregnant, neutropenic or urologic patients or patients with an indwelling urinary catheter)     Status: None   Collection Time: 08/19/23  6:36 PM   Specimen: Urine, Clean Catch  Result Value Ref Range Status   Specimen Description URINE, CLEAN CATCH  Final   Special Requests NONE  Final   Culture   Final    NO GROWTH Performed at [facility], [address] [place], [place] [address]    Report Status 08/20/2023 FINAL  Final  MRSA Next Gen by PCR, Nasal     Status: None   Collection Time: 08/20/23  3:57 PM   Specimen: Nasal Mucosa; Nasal Swab  Result Value Ref Range Status   MRSA by PCR Next Gen NOT DETECTED NOT DETECTED Final    Comment: (NOTE) The GeneXpert MRSA Assay (FDA approved for NASAL specimens only), is one component of a comprehensive MRSA colonization surveillance program. It is not intended to diagnose MRSA infection nor  to guide or monitor treatment for MRSA infections. Test performance is not FDA approved in patients less than 45 years old. Performed at [facility], [address] [place], [place] [address]     Procedures/Studies: US RENAL Result Date: 08/19/2023 CLINICAL DATA:  Urinary tract infection EXAM: RENAL / URINARY TRACT ULTRASOUND COMPLETE COMPARISON:  06/26/2023, 09/01/2022 FINDINGS: Right Kidney: Renal measurements: 10.0 x 4.5 x 4.1 cm =  volume: 96.1 mL. Echogenicity within normal limits. No mass or hydronephrosis visualized. Left Kidney: Renal measurements: 10.4 x 4.6 x 4.2 cm = volume: 104.1 mL. Echogenicity within normal limits. No mass or hydronephrosis visualized. Bladder: Appears normal for degree of bladder distention. Other: None. IMPRESSION: 1. Unremarkable renal ultrasound. Electronically Signed   By: Sharlet Salina M.D.   On: 08/19/2023 19:55   DG Chest Portable 1 View Result Date: 08/19/2023 CLINICAL DATA:  Shortness of breath, chest pain. EXAM: PORTABLE CHEST 1 VIEW COMPARISON:  June 26, 2023. FINDINGS: Stable cardiomegaly. No acute pulmonary disease is noted. Status post right shoulder arthroplasty. IMPRESSION: No acute abnormality seen. Electronically Signed   By: Lupita Raider M.D.   On: 08/19/2023 14:06   MM 3D SCREENING MAMMOGRAM BILATERAL BREAST Result Date: 08/10/2023 CLINICAL DATA:  Screening. EXAM: DIGITAL SCREENING BILATERAL MAMMOGRAM WITH TOMOSYNTHESIS AND CAD TECHNIQUE: Bilateral screening digital craniocaudal and mediolateral oblique mammograms were obtained. Bilateral screening digital breast tomosynthesis was performed. The images were evaluated with computer-aided detection. COMPARISON:  Previous exam(s). ACR Breast Density Category b: There are scattered areas of fibroglandular density. FINDINGS: There are no findings suspicious for malignancy. IMPRESSION: No mammographic evidence of malignancy. A result letter of this screening mammogram will be mailed directly to the patient. RECOMMENDATION: Screening mammogram in one year. (Code:SM-B-01Y) BI-RADS CATEGORY  1: Negative. Electronically Signed   By: Harmon Pier M.D.   On: 08/10/2023 14:42    Labs: BNP (last 3 results) Recent Labs    02/03/23 1533 06/10/23 1017 08/19/23 1036  BNP 192.8* 304.2* 248.3*   Basic Metabolic Panel: Recent Labs  Lab 08/19/23 1036 08/20/23 0449 08/21/23 0237  NA 130* 131* 129*  K 4.0 4.1 4.4  CL 96* 96* 93*  CO2 22  23 24   GLUCOSE 283* 153* 224*  BUN 13 18 20   CREATININE 0.71 0.87 0.81  CALCIUM 9.0 8.4* 8.4*   Liver Function Tests: Recent Labs  Lab 08/19/23 1036  AST 20  ALT 17  ALKPHOS 100  BILITOT 0.3  PROT 6.8  ALBUMIN 3.2*   No results for input(s): "LIPASE", "AMYLASE" in the last 168 hours. No results for input(s): "AMMONIA" in the last 168 hours. CBC: Recent Labs  Lab 08/19/23 1036 08/20/23 0449 08/21/23 0237  WBC 8.0 8.8 8.2  HGB 11.2* 11.8* 10.2*  HCT 33.2* 36.1 30.8*  MCV 91.7 93.3 92.8  PLT 219 232 185   Cardiac Enzymes: No results for input(s): "CKTOTAL", "CKMB", "CKMBINDEX", "TROPONINI" in the last 168 hours. BNP: Invalid input(s): "POCBNP" CBG: Recent Labs  Lab 08/20/23 1205 08/20/23 1652 08/20/23 2111 08/21/23 0609 08/21/23 0822  GLUCAP 203* 236* 182* 179* 206*   D-Dimer No results for input(s): "DDIMER" in the last 72 hours. Hgb A1c No results for input(s): "HGBA1C" in the last 72 hours. Lipid Profile No results for input(s): "CHOL", "HDL", "LDLCALC", "TRIG", "CHOLHDL", "LDLDIRECT" in the last 72 hours. Thyroid function studies No results for input(s): "TSH", "T4TOTAL", "T3FREE", "THYROIDAB" in the last 72 hours.  Invalid input(s): "FREET3" Anemia work up No results for input(s): "VITAMINB12", "  FOLATE", "FERRITIN", "TIBC", "IRON", "RETICCTPCT" in the last 72 hours. Urinalysis    Component Value Date/Time   COLORURINE YELLOW 07/09/2023 1131   APPEARANCEUR Clear 08/17/2023 1419   LABSPEC 1.013 07/09/2023 1131   PHURINE 6.0 07/09/2023 1131   GLUCOSEU Trace (A) 08/17/2023 1419   HGBUR NEGATIVE 07/09/2023 1131   BILIRUBINUR Negative 08/17/2023 1419   KETONESUR NEGATIVE 07/09/2023 1131   PROTEINUR 2+ (A) 08/17/2023 1419   PROTEINUR 30 (A) 07/09/2023 1131   UROBILINOGEN 0.2 07/23/2015 1350   NITRITE Negative 08/17/2023 1419   NITRITE NEGATIVE 07/09/2023 1131   LEUKOCYTESUR Negative 08/17/2023 1419   LEUKOCYTESUR NEGATIVE 07/09/2023 1131   Sepsis  Labs Recent Labs  Lab 08/19/23 1036 08/20/23 0449 08/21/23 0237  WBC 8.0 8.8 8.2   Microbiology Recent Results (from the past 240 hours)  Urine Culture     Status: Abnormal   Collection Time: 08/17/23  2:19 PM   Specimen: Urine   UR  Result Value Ref Range Status   Urine Culture, Routine Final report (A)  Final   Organism ID, Bacteria Proteus mirabilis (A)  Final    Comment: 10,000-25,000 colony forming units per mL   ORGANISM ID, BACTERIA Not applicable  Final   Antimicrobial Susceptibility Comment  Final    Comment:       ** S = Susceptible; I = Intermediate; R = Resistant **                    P = Positive; N = Negative             MICS are expressed in micrograms per mL    Antibiotic                 RSLT#1    RSLT#2    RSLT#3    RSLT#4 Amoxicillin/Clavulanic Acid    S Ampicillin                     S Cefazolin                      R Cefepime                       I Cefoxitin                      S Cefpodoxime                    S Ceftriaxone                    S Ciprofloxacin                  S Ertapenem                      S Gentamicin                     S Levofloxacin                   S Meropenem                      I Nitrofurantoin                 R Piperacillin/Tazobactam        S Tetracycline  R Tobramycin                     S Trimethoprim/Sulfa             S   Microscopic Examination     Status: Abnormal   Collection Time: 08/17/23  2:19 PM   Urine  Result Value Ref Range Status   WBC, UA None seen 0 - 5 /hpf Final   RBC, Urine 0-2 0 - 2 /hpf Final   Epithelial Cells (non renal) 0-10 0 - 10 /hpf Final   Renal Epithel, UA None seen None seen /hpf Final   Bacteria, UA Few (A) None seen/Few Final   Yeast, UA None seen None seen Final  Urine Culture (for pregnant, neutropenic or urologic patients or patients with an indwelling urinary catheter)     Status: None   Collection Time: 08/19/23  6:36 PM   Specimen: Urine, Clean Catch   Result Value Ref Range Status   Specimen Description URINE, CLEAN CATCH  Final   Special Requests NONE  Final   Culture   Final    NO GROWTH Performed at [facility], [address], [place], [place] [address]    Report Status 08/20/2023 FINAL  Final  MRSA Next Gen by PCR, Nasal     Status: None   Collection Time: 08/20/23  3:57 PM   Specimen: Nasal Mucosa; Nasal Swab  Result Value Ref Range Status   MRSA by PCR Next Gen NOT DETECTED NOT DETECTED Final    Comment: (NOTE) The GeneXpert MRSA Assay (FDA approved for NASAL specimens only), is one component of a comprehensive MRSA colonization surveillance program. It is not intended to diagnose MRSA infection nor to guide or monitor treatment for MRSA infections. Test performance is not FDA approved in patients less than 62 years old. Performed at [facility], [address], [place], [place] [address]      Time coordinating discharge: 25 minutes  SIGNED: Lanae Boast, MD  [facility] 08/21/2023, 11:07 AM  If 7PM-7AM, please contact night-coverage [email or web address]

## 2023-08-21 NOTE — Progress Notes (Signed)
   08/21/23 1119  TOC Brief Assessment  Insurance and Status Reviewed  Patient has primary care physician Yes  Home environment has been reviewed spouse  Prior level of function: has DME  Prior/Current Home Services No current home services  Social Drivers of Health Review SDOH reviewed no interventions necessary   PT recommending HHPT. Patient currently being discharged. Spoke to her and her husband at bedside regarding HHPT. They request [facility]. Kandee Keen with [facility] accepted referral.   Asked MD for order and face to face. Information added to AVS

## 2023-08-21 NOTE — Progress Notes (Signed)
 Physical Therapy Evaluation Patient Details Name: Amber Stephenson MRN: [id number] DOB: 05/21/1946 Today's Date: 08/21/2023  History of Present Illness  Pt is a 78 yo female who presented on 08/19/23 with chest pain and shortness of breath, initially feeling poorly x 2 weeks.  Patient was admitted for A-fib with RVR, ongoing UTI.   A fib, bipolar d/o, CAD, chronic diastolic CHF, chronic back pain, HTN, HLD, NASH,OSA, pulmonary HTN, and DM.   Clinical Impression  Pt in bed upon arrival with husband present and agreeable to PT eval. PTA, pt would furniture surf in the home and use a RW in the community. Pt has 24/7 physical assist available from her husband and occasionally needed assist with bed mobility, transfers, and gait. In today's session, pt required MinA for bed mobility and CGA to stand with no AD. Pt was able to ambulate ~80 ft with no AD, however, was mildly unsteady and would drift left/right. Encourage pt to use RW for improved stability and to reduce falls risk. Pt currently with functional limitations due to the deficits listed below (see PT Problem List). Pt would benefit from acute skilled PT to address functional impairments. Recommending post-acute HHPT to work towards independence with mobility. Acute PT to follow.  SpO2 >96% on 2L         If plan is discharge home, recommend the following: A little help with walking and/or transfers;A little help with bathing/dressing/bathroom;Assist for transportation;Help with stairs or ramp for entrance   Can travel by private vehicle    Yes    Equipment Recommendations None recommended by PT (Pt owns equipment)     Functional Status Assessment Patient has had a recent decline in their functional status and demonstrates the ability to make significant improvements in function in a reasonable and predictable amount of time.     Precautions / Restrictions Precautions Precautions: Fall Recall of Precautions/Restrictions:  Intact Restrictions Weight Bearing Restrictions Per Provider Order: No      Mobility  Bed Mobility Overal bed mobility: Needs Assistance Bed Mobility: Supine to Sit, Sit to Supine    Supine to sit: Min assist, HOB elevated, Used rails Sit to supine: Min assist   General bed mobility comments: MinA for slight trunk elevation, pt used hand rails to push. MinA for return to supine for LE management    Transfers Overall transfer level: Needs assistance Equipment used: None Transfers: Sit to/from Stand Sit to Stand: Contact guard assist    General transfer comment: CGA for safety, no UE support    Ambulation/Gait Ambulation/Gait assistance: Contact guard assist Gait Distance (Feet): 80 Feet Assistive device: None Gait Pattern/deviations: Step-through pattern, Decreased step length - left, Decreased step length - right, Drifts right/left, Trunk flexed Gait velocity: decr     General Gait Details: mildly unsteady with no UE support, tends to drift left/right.        Balance Overall balance assessment: Needs assistance, History of Falls, Mild deficits observed, not formally tested Sitting-balance support: Feet supported, No upper extremity supported Sitting balance-Leahy Scale: Good     Standing balance support: No upper extremity supported Standing balance-Leahy Scale: Fair Standing balance comment: able to stand and ambulate with no UE support, mildly unsteady with gait        Pertinent Vitals/Pain Pain Assessment Pain Assessment: No/denies pain    Home Living Family/patient expects to be discharged to:: Private residence Living Arrangements: Spouse/significant other Available Help at Discharge: Family;Available 24 hours/day Type of Home: House Home Access: Stairs to enter  Entrance Stairs-Rails: Left Entrance Stairs-Number of Steps: 3-4   Home Layout: Able to live on main level with bedroom/bathroom;Two level Home Equipment: Agricultural consultant (2 wheels);Cane -  single point;Shower seat - built in;Grab bars - toilet;Grab bars - tub/shower;Other (comment) (has access to 3 wheeled walker if needed) Additional Comments: Uses 3 Ls O2 as needed and at night.    Prior Function Prior Level of Function : Needs assist;History of Falls (last six months)       Physical Assist : Mobility (physical) Mobility (physical): Bed mobility;Transfers;Gait   Mobility Comments: 2 falls in the past 6 months, furniture surfs in the home and RW in the community. Husband helps with bed mobility, transfers, and gait as needed ADLs Comments: ADLs ind, spouse assists with IADLs as needed including driving     Extremity/Trunk Assessment   Upper Extremity Assessment Upper Extremity Assessment: Defer to OT evaluation    Lower Extremity Assessment Lower Extremity Assessment: Generalized weakness    Cervical / Trunk Assessment Cervical / Trunk Assessment: Normal  Communication   Communication Communication: No apparent difficulties    Cognition Arousal: Alert Behavior During Therapy: WFL for tasks assessed/performed   PT - Cognitive impairments: No apparent impairments    Following commands: Intact       Cueing Cueing Techniques: Verbal cues     General Comments General comments (skin integrity, edema, etc.): Husband present upon eval and supportive throughout session     PT Assessment Patient needs continued PT services  PT Problem List Decreased strength;Decreased activity tolerance;Decreased balance;Decreased mobility;Cardiopulmonary status limiting activity       PT Treatment Interventions DME instruction;Gait training;Stair training;Functional mobility training;Therapeutic activities;Therapeutic exercise;Neuromuscular re-education;Balance training;Patient/family education    PT Goals (Current goals can be found in the Care Plan section)  Acute Rehab PT Goals Patient Stated Goal: to get stronger and work on improving balance PT Goal Formulation:  With patient/family Time For Goal Achievement: 09/04/23 Potential to Achieve Goals: Good    Frequency Min 2X/week        AM-PAC PT "6 Clicks" Mobility  Outcome Measure Help needed turning from your back to your side while in a flat bed without using bedrails?: A Little Help needed moving from lying on your back to sitting on the side of a flat bed without using bedrails?: A Little Help needed moving to and from a bed to a chair (including a wheelchair)?: A Little Help needed standing up from a chair using your arms (e.g., wheelchair or bedside chair)?: A Little Help needed to walk in hospital room?: A Little Help needed climbing 3-5 steps with a railing? : A Lot 6 Click Score: 17    End of Session Equipment Utilized During Treatment: Gait belt;Oxygen Activity Tolerance: Patient tolerated treatment well Patient left: in bed;with call bell/phone within reach;with family/visitor present Nurse Communication: Mobility status PT Visit Diagnosis: Unsteadiness on feet (R26.81);Other abnormalities of gait and mobility (R26.89);History of falling (Z91.81);Muscle weakness (generalized) (M62.81)    Time: 1009-1030 PT Time Calculation (min) (ACUTE ONLY): 21 min   Charges:   PT Evaluation $PT Eval Low Complexity: 1 Low   PT General Charges $$ ACUTE PT VISIT: 1 Visit    Hilton Cork, PT, DPT Secure Chat Preferred  Rehab Office [phone]   Arturo Morton Brion Aliment 08/21/2023, 10:38 AM

## 2023-08-21 NOTE — Progress Notes (Signed)
 Went over discharge paperwork with patient and husband. All questions answered, PIV/telemetry removed. All belongings at bedside.

## 2023-08-21 NOTE — Progress Notes (Signed)
 Not able to [facility], RN note. Monica unable to return Lorazepam 0.5mg  to pyxis before patient discharged out of system. Witnessed Monica return medications to main pharmacy.

## 2023-08-24 ENCOUNTER — Ambulatory Visit: Payer: Self-pay

## 2023-08-24 ENCOUNTER — Encounter: Payer: Self-pay | Admitting: *Deleted

## 2023-08-24 ENCOUNTER — Telehealth: Payer: Self-pay | Admitting: *Deleted

## 2023-08-24 NOTE — Telephone Encounter (Signed)
 Chief Complaint: UTI symptoms on antibiotic Symptoms: frequency, incontinence, urine that is cloudy and "bubbly" Frequency: UTI for 1 month Pertinent Negatives: Patient denies fever, chills, N/V, back pain, abd pain, hematuria Disposition: [] ED /[] Urgent Care (no appt availability in office) / [x] Appointment(In office/virtual)/ []  [place] [facility] [facility]/ [] Home Care/ [] Refused Recommended Disposition /[] [place] [facility]/ []  Follow-up with PCP Additional Notes: Pt reports ongoing UTI for a month. States she finished taking amoxicillin yesterday and started taking Bactrim today. Pt denies pain but states she is urinating every 15 minutes and that her urine is cloudy and "bubbly." Denies hematuria, back pain, abd pain, fever, chills, N/V. RN advised pt she needs to be seen within 24 hours. Pt scheduled in office tomorrow 3/25 at 12 noon. Pt agreeable to that plan. RN advised pt if she develops a fever, N/V, blood in her urine, or pain she needs to go to the ED and she verbalized understanding.     Reason for Disposition  [1] Taking antibiotic > 72 hours (3 days) for UTI AND [2] painful urination or frequency is SAME (unchanged, not better)  Answer Assessment - Initial Assessment Questions 1. MAIN SYMPTOM: "What is the main symptom you are concerned about?" (e.g., painful urination, urine frequency)     Urinary frequency, incontinence  2. BETTER-SAME-WORSE: "Are you getting better, staying the same, or getting worse compared to how you felt at your last visit to the doctor (most recent medical visit)?"     The same  3. PAIN: "How bad is the pain?"  (e.g., Scale 1-10; mild, moderate, or severe)   - MILD (1-3): complains slightly about urination hurting   - MODERATE (4-7): interferes with normal activities     - SEVERE (8-10): excruciating, unwilling or unable to urinate because of the pain      No pain 4. FEVER: "Do you have a fever?" If Yes, ask: "What is it, how was it measured, and when  did it start?"     No, no chills either 5. OTHER SYMPTOMS: "Do you have any other symptoms?" (e.g., blood in the urine, flank pain, vaginal discharge)     Cloudy, "bubbly" urine, frequent urination q10-15 mins 6. DIAGNOSIS: "When was the UTI diagnosed?" "By whom?" "Was it a kidney infection, bladder infection or both?"     A month ago 7. ANTIBIOTIC: "What antibiotic(s) are you taking?" "How many times per day?"     Finished amoxicillin yesterday, started Bactrim today 8. ANTIBIOTIC - START DATE: "When did you start taking the antibiotic?"     Amoxicillin 3/21, Bactrim 3/23  Protocols used: Urinary Tract Infection on Antibiotic Follow-up Call - [facility]

## 2023-08-24 NOTE — Transitions of Care (Post Inpatient/ED Visit) (Signed)
 08/24/2023  Name: Amber Stephenson MRN: [id number] DOB: May 11, 1946  Today's TOC FU Call Status: Today's TOC FU Call Status:: Successful TOC FU Call Completed TOC FU Call Complete Date: 08/24/23 Patient's Name and Date of Birth confirmed.  Transition Care Management Follow-up Telephone Call Date of Discharge: 08/21/23 Discharge Facility: [facility]) Type of Discharge: Inpatient Admission Primary Inpatient Discharge Diagnosis:: Atrial Fibrillation How have you been since you were released from the hospital?:  (pt states feeling better, eating, drinking well, has all appointments " lined up", spouse provides transportation) Any questions or concerns?: No  Items Reviewed: Did you receive and understand the discharge instructions provided?: Yes Medications obtained,verified, and reconciled?: Yes (Medications Reviewed) Any new allergies since your discharge?: No Dietary orders reviewed?: Yes Type of Diet Ordered:: heart healthy, carbohydrate modified Do you have support at home?: Yes People in Home: spouse Name of Support/Comfort Primary Source: Michalle Rademaker Patient agreed to participation in [facility] 30 day program   Goals Addressed             This Visit's Progress    TOC care plan/ pt will have no readmissions within 30 days       Current Barriers:  Home Health services [facility] health has not contacted patient to schedule services Functional/Safety pt reports she has decreased endurance, is glad she will be working with PT Knowledge Deficits related to plan of care for management of Atrial Fibrillation  Patient states she had leg pain this morning but has now resolved  RNCM Clinical Goal(s):  Patient will work with the Care Management team over the next 30 days to address Transition of Care Barriers: Home Health services Functional/Safety verbalize understanding of plan for management of Atrial Fibrillation as evidenced by patient report, review of EMR take all  medications exactly as prescribed and will call provider for medication related questions as evidenced by patient report, review of EMR and  through collaboration with RN Care manager, provider, and care team.   Interventions: Evaluation of current treatment plan related to  self management and patient's adherence to plan as established by provider Telephone call to [facility], spoke with Samuel Bouche, home health PT will start 08/29/23, they will call pt and schedule a time, pt verbalizes understanding  AFIB Interventions: (Status:  New goal. and Goal on track:  Yes.) Short Term Goal   Counseled on increased risk of stroke due to Afib and benefits of anticoagulation for stroke prevention Reviewed importance of adherence to anticoagulant exactly as prescribed Counseled on importance of regular laboratory monitoring as prescribed Afib action plan reviewed Assessed social determinant of health barriers Reviewed all upcoming scheduled appointments In basket message to Danise Edge longitudinal care manager, reporting pt is enrolled in [facility] 30 day program, cancel 08/25/23 appointment  Patient Goals/Self-Care Activities: Participate in Transition of Care Program/Attend [facility] scheduled calls Notify RN Care Manager of [facility] call rescheduling needs Take all medications as prescribed Attend all scheduled provider appointments Call pharmacy for medication refills 3-7 days in advance of running out of medications Call provider office for new concerns or questions  [facility] will contact you to set up day and time of first visit   [phone]  Follow Up Plan:  Telephone follow up appointment with care management team member scheduled for:  09/01/23 @ 1 pm  .         Medications Reviewed Today: Medications Reviewed Today     Reviewed by Audrie Gallus, RN (Registered Nurse) on  08/24/23 at 1415  Med List Status: <None>   Medication Order Taking? Sig Documenting Provider Last Dose Status  Informant  albuterol (VENTOLIN HFA) 108 (90 Base) MCG/ACT inhaler [id number] Yes Inhale 2 puffs into the lungs every 4 (four) hours as needed for wheezing or shortness of breath. Mechele Claude, MD Taking Active Spouse/Significant Other  allopurinol (ZYLOPRIM) 100 MG tablet [id number] Yes Take 100 mg by mouth daily. [provider] Taking Active Spouse/Significant Other  amoxicillin (AMOXIL) 500 MG capsule [id number] Yes Take 1 capsule (500 mg total) by mouth 3 (three) times daily for 3 days. Lanae Boast, MD Taking Active   apixaban (ELIQUIS) 5 MG TABS tablet [id number] Yes Take 1 tablet (5 mg total) by mouth 2 (two) times daily. Quintella Reichert, MD Taking Active Spouse/Significant Other  ARIPiprazole (ABILIFY) 2 MG tablet [id number] Yes Take 2 mg by mouth at bedtime. [provider] Taking Active Spouse/Significant Other  atorvastatin (LIPITOR) 80 MG tablet [id number]  Take 1 tablet (80 mg total) by mouth daily.  Patient taking differently: Take 80 mg by mouth every evening.   Sharlene Dory, [place]  Expired 08/19/23 2359 Spouse/Significant Other  benzonatate (TESSALON) 100 MG capsule [id number] No TAKE 2 CAPSULES(200 MG) BY MOUTH THREE TIMES DAILY AS NEEDED FOR COUGH  Patient not taking: Reported on 08/24/2023   Mechele Claude, MD Not Taking Active Spouse/Significant Other  clotrimazole (MYCELEX) 10 MG troche [id number] Yes Take 10 mg by mouth in the morning and at bedtime. [provider] Taking Active Spouse/Significant Other  Continuous Glucose Sensor (DEXCOM G7 SENSOR) MISC [id number] Yes CHANGE SENSOR EVERY 10 DAYS Shamleffer, Konrad Dolores, MD Taking Active Spouse/Significant Other  cyanocobalamin (VITAMIN B12) 1000 MCG tablet [id number] Yes Take 1 tablet (1,000 mcg total) by mouth daily. Ghimire, Werner Lean, MD Taking Active Spouse/Significant Other  denosumab (PROLIA) 60 MG/ML SOSY injection [id number] Yes Inject 60 mg into the skin every 6 (six) months. Mechele Claude, MD  Taking Active Spouse/Significant Other  doxepin (SINEQUAN) 10 MG capsule [id number] Yes Take 10 mg by mouth at bedtime. [provider] Taking Active Spouse/Significant Other  escitalopram (LEXAPRO) 10 MG tablet [id number] Yes Take 10 mg by mouth daily. [provider] Taking Active Spouse/Significant Other  fluticasone-salmeterol (ADVAIR DISKUS) 100-50 MCG/ACT AEPB [id number] Yes Inhale 1 puff into the lungs 2 (two) times daily.  Patient taking differently: Inhale 1 puff into the lungs 2 (two) times daily as needed (for flares).   Julio Sicks, NP Taking Active Spouse/Significant Other           Med Note Antony Madura, Melody Comas Jan 28, 2023  8:18 PM)    hydrOXYzine (VISTARIL) 25 MG capsule [id number] Yes Take 25 mg by mouth 3 (three) times daily. [provider] Taking Active Spouse/Significant Other  Insulin Disposable Pump (OMNIPOD 5 G7 PODS, GEN 5,) MISC [id number] No 1 Device by Does not apply route every other day.  Patient not taking: Reported on 08/24/2023   Shamleffer, Konrad Dolores, MD Not Taking Active Spouse/Significant Other  insulin glargine (LANTUS SOLOSTAR) 100 UNIT/ML Solostar Pen [id number] Yes Inject 32 Units into the skin 2 (two) times daily. Shamleffer, Konrad Dolores, MD Taking Active Spouse/Significant Other  insulin lispro (HUMALOG KWIKPEN) 100 UNIT/ML KwikPen [id number] Yes Max daily 45 units  Patient taking differently: Inject 6-45 Units into the skin 3 (three) times daily. Max daily 45 units   Shamleffer, Konrad Dolores, MD Taking Active Spouse/Significant Other  Insulin Pen Needle 29G X MISC [id number] Yes  1 Device by Does not apply route daily in the afternoon. Shamleffer, Konrad Dolores, MD Taking Active Spouse/Significant Other  levocetirizine (XYZAL ALLERGY 24HR) 5 MG tablet [id number] Yes Take 1 tablet (5 mg total) by mouth every evening. For itch Mechele Claude, MD Taking Active Spouse/Significant Other  lidocaine (XYLOCAINE) 2 %  solution [id number] No Use as directed 15 mLs in the mouth or throat every 4 (four) hours as needed for mouth pain.  Patient not taking: Reported on 08/24/2023   Mechele Claude, MD Not Taking Active Spouse/Significant Other  LORazepam (ATIVAN) 0.5 MG tablet [id number] Yes Take 0.5-1 mg by mouth See admin instructions. Take 0.5 mg by mouth in the morning and 1 mg at bedtime [provider] Taking Active Spouse/Significant Other  losartan (COZAAR) 25 MG tablet [id number] Yes Take 25 mg by mouth daily. [provider] Taking Active Spouse/Significant Other  MAGNESIUM PO [id number] Yes Take 1 tablet by mouth daily. [provider] Taking Active Spouse/Significant Other  meclizine (ANTIVERT) 25 MG tablet [id number] Yes Take 1 tablet (25 mg total) by mouth 3 (three) times daily as needed for dizziness. Dettinger, Elige Radon, MD Taking Active Spouse/Significant Other  metFORMIN (GLUCOPHAGE-XR) 500 MG 24 hr tablet [id number] Yes Take 2 tablets (1,000 mg total) by mouth daily with breakfast. Shamleffer, Konrad Dolores, MD Taking Active Spouse/Significant Other  methocarbamol 1000 MG TABS [id number] Yes Take 1,000 mg by mouth every 6 (six) hours as needed for muscle spasms. Lovorn, Aundra Millet, MD Taking Active Spouse/Significant Other           Med Note Sherlon Handing, CHANELLE D   Thu Jun 11, 2023 11:10 AM)    metoprolol tartrate (LOPRESSOR) 50 MG tablet [id number] Yes Take 1 tablet (50 mg total) by mouth 2 (two) times daily. Quintella Reichert, MD Taking Active Spouse/Significant Other  naloxone [facility]) nasal spray 4 mg/0.1 mL [id number] Yes Place 1 spray into the nose daily as needed (for over dose). [provider] Taking Active Spouse/Significant Other           Med Note (CRUTHIS, CHLOE C   Wed Aug 19, 2023  1:55 PM)    nystatin (MYCOSTATIN/NYSTOP) powder [id number] Yes Apply 1 Application topically as needed (rash). Shamleffer, Konrad Dolores, MD Taking Active Spouse/Significant Other            Med Note (CRUTHIS, CHLOE C   Wed Aug 19, 2023  1:55 PM)    nystatin ointment (MYCOSTATIN) [id number] Yes Apply 1 Application topically 2 (two) times daily as needed (for rashes). Adline Potter, NP Taking Active Spouse/Significant Other           Med Note (CRUTHIS, CHLOE C   Wed Aug 19, 2023  1:55 PM)    omeprazole (PRILOSEC) 40 MG capsule [id number] Yes Take 40 mg by mouth daily. [provider] Taking Active Spouse/Significant Other  ondansetron (ZOFRAN) 4 MG tablet [id number] Yes Take 1 tablet (4 mg total) by mouth every 8 (eight) hours as needed for nausea or vomiting. For cancer related nausea Lovorn, Megan, MD Taking Active Spouse/Significant Other  OXcarbazepine (TRILEPTAL) 150 MG tablet [id number] Yes Take 1 tablet (150 mg total) by mouth 2 (two) times daily. Marguerita Merles [place], DO Taking Active Spouse/Significant Other  Oxycodone HCl 20 MG TABS [id number] Yes Take 1 tablet (20 mg total) by mouth 2 (two) times daily as needed. Do Not Fill Before 08/09/2023 Jones Bales, NP Taking Active Spouse/Significant Other  potassium chloride (KLOR-CON) 10 MEQ tablet [id number]  Take 1 tablet (10  mEq total) by mouth daily. Sharlene Dory, [place]  Expired 08/19/23 2359 Spouse/Significant Other  pregabalin (LYRICA) 200 MG capsule [id number] Yes Take 200 mg by mouth 2 (two) times daily. [provider] Taking Active Spouse/Significant Other           Med Note Antony Madura, Arn Medal   Wed Jan 28, 2023  8:22 PM)    promethazine (PHENERGAN) 25 MG tablet [id number] No Take 0.5-1 tablets (12.5-25 mg total) by mouth every 6 (six) hours as needed for refractory nausea / vomiting (cancer related nausea/vomiting).  Patient not taking: Reported on 08/24/2023   Genice Rouge, MD Not Taking Active Spouse/Significant Other  rOPINIRole (REQUIP) 1 MG tablet [id number] Yes Take 1 tablet (1 mg total) by mouth at bedtime. For leg cramps Mechele Claude, MD Taking Active Spouse/Significant Other  senna-docusate  (SENOKOT-S) 8.6-50 MG tablet [id number] Yes Take 1 tablet by mouth at bedtime. Marguerita Merles Penn, DO Taking Active Spouse/Significant Other           Med Note (CRUTHIS, CHLOE C   Wed Aug 19, 2023  1:55 PM)    spironolactone (ALDACTONE) 25 MG tablet [id number]  Take 0.5 tablets (12.5 mg total) by mouth daily. Narda Bonds, MD  Expired 06/11/23 2359 Spouse/Significant Other, Pharmacy Records           Med Note (CRUTHIS, CHLOE C   Wed Aug 19, 2023  1:55 PM)    Suvorexant (BELSOMRA) 20 MG TABS [id number] Yes Take 1 tablet (20 mg total) by mouth at bedtime as needed.  Patient taking differently: Take 20 mg by mouth at bedtime as needed (for sleep).   Mechele Claude, MD Taking Active Spouse/Significant Other  tirzepatide [facility]) 10 MG/0.5ML Pen [id number] Yes Inject 10 mg into the skin once a week. Shamleffer, Konrad Dolores, MD Taking Active Spouse/Significant Other           Med Note (SATTERFIELD, Genoveva Ill   Wed Aug 19, 2023  5:04 PM) Take on Sundays  TYLENOL 500 MG tablet [id number] Yes Take 500-1,000 mg by mouth every 6 (six) hours as needed for mild pain (pain score 1-3) or headache. [provider] Taking Active Spouse/Significant Other           Med Note (CRUTHIS, CHLOE C   Wed Aug 19, 2023  1:55 PM)    Vitamin D, Cholecalciferol, 25 MCG (1000 UT) TABS [id number] Yes Take 1,000 Units by mouth daily in the afternoon. [provider] Taking Active Spouse/Significant Other  Med List Note Benjamine Mola 08/29/22 1018): [employer] insurance             Home Care and Equipment/Supplies: Were Home Health Services Ordered?: Yes (telephone call with Samuel [facility]che at [facility], they will be seeing pt for PT on 3/29 and will call pt and schedule a time) Name of Home Health Agency:: [facility] Has Agency set up a time to come to your home?: No EMR reviewed for Home Health Orders: Orders present/patient has not received call (refer to CM for follow-up) Any new  equipment or medical supplies ordered?: No  Functional Questionnaire: Do you need assistance with bathing/showering or dressing?: No Do you need assistance with meal preparation?: No Do you need assistance with eating?: No Do you have difficulty maintaining continence: No Do you need assistance with getting out of bed/getting out of a chair/moving?: No Do you have difficulty managing or taking your medications?: No  Follow up appointments reviewed: PCP Follow-up appointment confirmed?: Yes Date of  PCP follow-up appointment?: 09/07/23 Follow-up Provider: Dr. Mechele Claude  @ 1110 am/ also  [facility] Follow-up appointment confirmed?: Yes Date of Specialist follow-up appointment?: 08/27/23 Follow-Up Specialty Provider:: Armanda Magic   cardiology  @ 1130 am    09/02/23  pulmonary Do you need transportation to your follow-up appointment?: No Do you understand care options if your condition(s) worsen?: Yes-patient verbalized understanding  SDOH Interventions Today    Flowsheet Row Most Recent Value  SDOH Interventions   Food Insecurity Interventions Intervention Not Indicated  Housing Interventions Intervention Not Indicated  Transportation Interventions Intervention Not Indicated  Utilities Interventions Intervention Not Indicated       Irving Shows [facility], BSN RN Care Manager/ Transition of Care [place]/ [facility] [phone]

## 2023-08-25 ENCOUNTER — Other Ambulatory Visit: Payer: Self-pay | Admitting: *Deleted

## 2023-08-25 ENCOUNTER — Ambulatory Visit (INDEPENDENT_AMBULATORY_CARE_PROVIDER_SITE_OTHER): Admitting: Nurse Practitioner

## 2023-08-25 ENCOUNTER — Encounter: Payer: Self-pay | Admitting: Nurse Practitioner

## 2023-08-25 VITALS — BP 130/82 | HR 87 | Temp 97.4°F | Ht 62.0 in | Wt 183.8 lb

## 2023-08-25 DIAGNOSIS — N3944 Nocturnal enuresis: Secondary | ICD-10-CM | POA: Insufficient documentation

## 2023-08-25 DIAGNOSIS — E211 Secondary hyperparathyroidism, not elsewhere classified: Secondary | ICD-10-CM | POA: Diagnosis not present

## 2023-08-25 DIAGNOSIS — D631 Anemia in chronic kidney disease: Secondary | ICD-10-CM | POA: Diagnosis not present

## 2023-08-25 DIAGNOSIS — R809 Proteinuria, unspecified: Secondary | ICD-10-CM | POA: Diagnosis not present

## 2023-08-25 DIAGNOSIS — I5032 Chronic diastolic (congestive) heart failure: Secondary | ICD-10-CM | POA: Diagnosis not present

## 2023-08-25 DIAGNOSIS — I10 Essential (primary) hypertension: Secondary | ICD-10-CM | POA: Diagnosis not present

## 2023-08-25 DIAGNOSIS — I2510 Atherosclerotic heart disease of native coronary artery without angina pectoris: Secondary | ICD-10-CM | POA: Diagnosis not present

## 2023-08-25 DIAGNOSIS — I4821 Permanent atrial fibrillation: Secondary | ICD-10-CM | POA: Diagnosis not present

## 2023-08-25 DIAGNOSIS — N189 Chronic kidney disease, unspecified: Secondary | ICD-10-CM | POA: Diagnosis not present

## 2023-08-25 DIAGNOSIS — E785 Hyperlipidemia, unspecified: Secondary | ICD-10-CM | POA: Diagnosis not present

## 2023-08-25 NOTE — Progress Notes (Addendum)
 Acute Office Visit  Subjective:     Patient ID: Amber Stephenson, female    DOB: [birth year], 78 y.o.   MRN: [id number]  Chief Complaint  Patient presents with   Urinary Tract Infection   HPI Amber Stephenson is a 78 y.o. female who complains of urinary frequency, urgency and  x 56 month   78 year old female present with her husband for an acute visit on August 25, 2023 complaining of urinary frequency and urgency. James was just discharged from the hospital, had repeat urine culture on admission negative  she was d/c with a 3-days course of  amoxicillin which she completed. Her husband reports that he  picked up the ATB that was prescribed during the visit on 08/04/2023 by Gennette Pac FNP and  she is on her third dose. She c/o of  frequency and urgency for 1 week  reports '3 accidents weekly". Reports wake up in the morning wet " I don't feel when I have to go at night, But during the day I am fine, no accident". Denies dysuria flank pain, fever, chills, or abnormal vaginal discharge or bleeding. Review of her recent  urine culture " Last urine culture on 2/ /6 showed Enterococcus treated with Macrobid x 5 days. Repeat culture on 3/4 also with Enterococcus, treated with Augmentin x 7 days. Repeat urine culture on 3/17 colonic grew Proteus 10,000-24,000 cfu-sensitivie to Augmentin/ampicillin". Will repeat culture today.  Active Ambulatory Problems    Diagnosis Date Noted   Chronic atrial fibrillation with RVR (HCC) 01/04/2015   Insomnia 01/23/2015   RLS (restless legs syndrome) 04/27/2015   Essential hypertension    Long term current use of anticoagulant 05/30/2015   Chronic heart failure with preserved ejection fraction (HFpEF) (HCC) 05/30/2015   Herpes genitalis in women 07/16/2015   Major depressive disorder 10/03/2015   Diabetic neuropathy (HCC) 02/06/2016   Migraine headache with aura 02/12/2016   Atrial fibrillation with RVR (HCC) 05/19/2017   Senile osteoporosis 12/15/2017    Non-alcoholic fatty liver disease 01/12/2018   At risk for adverse drug event 07/06/2018   Acute pulmonary edema (HCC) 01/21/2019   Acute on chronic hypoxic respiratory failure (HCC) 01/22/2019   OSA (obstructive sleep apnea) 02/24/2019   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   Vertigo 04/25/2019   Bipolar 1 disorder (HCC) 10/04/2015   Mixed hyperlipidemia 05/20/2019   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   Mild intermittent asthma 01/12/2018   S/P shoulder replacement, 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   CAD (coronary artery disease)    Hypocalcemia    Chronic respiratory failure with hypoxia (HCC) 04/25/2020   Chronic constipation 04/25/2020   Insulin dependent type 2 diabetes mellitus (HCC) 09/24/2020   Functional diarrhea 10/26/2020   Chronic post-traumatic stress disorder (PTSD) 12/06/2020   Senile nuclear sclerosis 12/06/2020   Senile corneal changes 12/06/2020   Postmenopausal bleeding 12/06/2020   Acquired hammer toe 12/06/2020   Opioid dependence 12/06/2020   Hypermetropia 12/06/2020   Generalized weakness 12/06/2020   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   Generalized anxiety disorder 12/06/2020   Elevated troponin 12/14/2020   Acute diastolic CHF (congestive heart failure) (HCC) 12/16/2020   Vaginal itching 04/16/2021   Chronic bilateral low back pain with right-sided sciatica 07/19/2021   Tremor, essential 12/11/2021   Sensory ataxia 03/28/2021   Microalbuminuria 01/21/2022   Hemorrhoids 03/11/2022   Rectal itching 03/11/2022   AKI (acute kidney injury) (HCC) 06/27/2022   Renal insufficiency 08/08/2022   Frequent falls 09/01/2022   Hypotension 09/01/2022   UTI (urinary  tract infection) 11/09/2022   Vitamin B12 deficiency 11/09/2022   Scalp hematoma, initial encounter 11/09/2022   PMB (postmenopausal bleeding) 11/13/2022   Superficial fungus infection of skin 11/13/2022   Burning with urination 11/13/2022   Vulvar itching 12/22/2022   Metabolic acidosis  01/12/2023   Endometrial cancer (HCC) 01/12/2023   Acute on chronic respiratory failure with hypoxia (HCC) 01/12/2023   Pure hypercholesterolemia 01/14/2023   Primary hypertension 01/14/2023   Acute heart failure (HCC) 01/28/2023   Type 2 diabetes mellitus with hyperglycemia, with long-term current use of insulin (HCC) 02/12/2023   Long term (current) use of oral hypoglycemic drugs 02/12/2023   Nausea and vomiting 02/16/2023   Candidiasis of mouth 05/15/2023   Hyponatremia 06/10/2023   RSV (respiratory syncytial virus infection) 06/10/2023   Abdominal pain 06/10/2023   Diarrhea 06/10/2023   Nocturnal enuresis 08/25/2023   Resolved Ambulatory Problems    Diagnosis Date Noted   Chronic back pain 01/04/2015   Pruritus 01/04/2015   CKD stage 3 due to type 2 diabetes mellitus (HCC) 01/05/2015   Bipolar disorder (HCC) 01/23/2015   Lipoma 02/08/2015   Fall 02/26/2015   Dyspnea on exertion    Family history of coronary arteriosclerosis 05/30/2015   Genital lesion, female 07/12/2015   Encounter for preadmission testing    Suicidal ideation    Healthcare maintenance 02/12/2016   Fibromyalgia 03/19/2017   Microscopic colitis 05/19/2017   Hypotension 05/19/2017   Vulvovaginal candidiasis 05/19/2017   Asthma 11/12/2017   Cardiomegaly 01/12/2018   Left humeral fracture 03/06/2018   Fracture of four ribs of left side, closed, initial encounter 03/06/2018   Fall 03/06/2018   Closed fracture of proximal end of left humerus 03/16/2018   Morbid obesity with alveolar hypoventilation 07/06/2018   Hypoxia 02/01/2019   Allergic rhinitis 02/24/2019   CHF (congestive heart failure) (HCC) 10/04/2015   Benign paroxysmal positional vertigo due to bilateral vestibular disorder 05/20/2019   History of adenomatous polyp of colon 05/21/2019   Chronic pain syndrome 08/22/2019   Myofascial pain dysfunction syndrome 08/22/2019   Hypertension    Back pain/sacroiliitis--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   Ambulatory dysfunction 04/23/2020   Head trauma 09/17/2020   Dyslipidemia 09/24/2020   Bipolar I disorder, most recent episode (or current) manic (HCC) 12/06/2020   Presbyopia 12/06/2020   Postcoital bleeding 12/06/2020   Pinguecula 12/06/2020   Pain in joint, shoulder region 12/06/2020   Non-neoplastic nevus 12/06/2020   Essential hypertriglyceridemia 12/06/2020   Cystitis 12/06/2020   Acute exacerbation of CHF (congestive heart failure) (HCC) 12/13/2020   UTI (urinary tract infection) 12/14/2020   Elevated brain natriuretic peptide (BNP) level 12/14/2020   Acute respiratory failure with hypoxia (HCC) 12/16/2020   Dysuria 04/16/2021   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   Superficial fungus infection of skin 09/03/2021   Positive fecal occult blood test 03/11/2022   Rectal bleeding 03/11/2022   Encounter for screening fecal occult blood testing 03/11/2022   CHF (congestive heart failure) 06/08/2022   Acute respiratory failure with hypoxemia 06/09/2022   Shock (HCC) 06/27/2022   Acute metabolic encephalopathy 06/27/2022   At risk for progression of disease 07/06/2018   Past Medical History:  Diagnosis Date   Chronic diastolic heart failure (HCC) 05/30/2015   History of radiation therapy    Osteopenia 12/06/2020   Pulmonary hypertension    Type II diabetes mellitus (HCC) 09/24/2020     Review of Systems  Constitutional:  Negative  for fever.  HENT:  Negative for sinus pain and sore throat.   Respiratory:  Negative for cough, shortness of breath and wheezing.   Cardiovascular:  Negative for chest pain and leg swelling.  Gastrointestinal:  Negative for diarrhea, nausea and vomiting.  Genitourinary:  Positive for frequency and urgency.       Nocturnal aneresis  Skin:  Negative for itching and rash.  Neurological:  Negative for dizziness and tingling.   Negative unless indicated in HPI    Objective:    BP 130/82    Pulse 87   Temp (!) 97.4 F (36.3 C)   Ht 5\' 2"  (1.575 m)   Wt 183 lb 12.8 oz (83.4 kg)   SpO2 95%   BMI 33.62 kg/m  BP Readings from Last 3 Encounters:  08/25/23 130/82  08/21/23 123/62  08/17/23 (!) 148/83   Wt Readings from Last 3 Encounters:  08/25/23 183 lb 12.8 oz (83.4 kg)  08/20/23 182 lb 12.2 oz (82.9 kg)  08/17/23 185 lb 12.8 oz (84.3 kg)      Physical Exam Vitals and nursing note reviewed.  HENT:     Head: Normocephalic and atraumatic.     Nose: Nose normal.     Mouth/Throat:     Mouth: Mucous membranes are moist.  Eyes:     General: No scleral icterus.    Extraocular Movements: Extraocular movements intact.     Conjunctiva/sclera: Conjunctivae normal.     Pupils: Pupils are equal, round, and reactive to light.  Cardiovascular:     Heart sounds: Normal heart sounds.  Pulmonary:     Effort: Pulmonary effort is normal.  Abdominal:     Tenderness: There is no right CVA tenderness, left CVA tenderness, guarding or rebound.  Musculoskeletal:        General: Normal range of motion.     Right lower leg: No edema.     Left lower leg: No edema.  Skin:    General: Skin is warm and dry.     Findings: No rash.  Neurological:     Mental Status: She is alert and oriented to person, place, and time.  Psychiatric:        Mood and Affect: Mood normal.        Behavior: Behavior normal.        Thought Content: Thought content normal.        Judgment: Judgment normal.   Urine dipstick shows not done.  Micro exam: not done.  No results found for any visits on 08/25/23.      Assessment & Plan:  Nocturnal enuresis -     Urine Culture  Rozina is 78 yrs old female seen today fro nocturnal anuresis Order urine culture  to r/o UTI Lifestyle Modifications: Fluid Management: Reduce fluid intake 2-3 hours before bedtime. Avoid caffeine, alcohol, and carbonated beverages in the evening. Timed Voiding & Bladder Training: Encourage voiding right before bed; Set a  nighttime alarm to urinate if needed. Pelvic Floor Exercises (Kegels): Strengthen bladder control, especially if stress incontinence is present. Behavioral Interventions: Bladder Diary: Track fluid intake, voiding patterns, and incontinence episodes. Absorbent Products & Waterproof Bedding: To manage episodes while treating underlying causes. Future:  May consider Pharmacologic Therapy If Needed) will see PCP on 09/07/2023 for hospital F/u will CBC and CMP recheck as per hospital D/C  Return for with PCP as already schedule.  Martina Sinner, [facility] [address] [place], [place] [address] [phone]  [phone]  Note: This document was prepared by [employer] voice dictation technology and any errors that results from this process are unintentional.

## 2023-08-26 LAB — LIPID PANEL
Chol/HDL Ratio: 2.6 ratio (ref 0.0–4.4)
Cholesterol, Total: 150 mg/dL (ref 100–199)
HDL: 58 mg/dL (ref >39)
LDL Chol Calc (NIH): 68 mg/dL (ref 0–99)
Triglycerides: 136 mg/dL (ref 0–149)
VLDL Cholesterol Cal: 24 mg/dL (ref 5–40)

## 2023-08-26 LAB — URINE CULTURE: Organism ID, Bacteria: NO GROWTH

## 2023-08-26 LAB — COMPREHENSIVE METABOLIC PANEL
ALT: 14 IU/L (ref 0–32)
AST: 16 IU/L (ref 0–40)
Albumin: 4.4 g/dL (ref 3.8–4.8)
Alkaline Phosphatase: 121 IU/L (ref 44–121)
BUN/Creatinine Ratio: 10 — ABNORMAL LOW (ref 12–28)
BUN: 9 mg/dL (ref 8–27)
Bilirubin Total: 0.3 mg/dL (ref 0.0–1.2)
CO2: 24 mmol/L (ref 20–29)
Calcium: 9.4 mg/dL (ref 8.7–10.3)
Chloride: 92 mmol/L — ABNORMAL LOW (ref 96–106)
Creatinine, Ser: 0.91 mg/dL (ref 0.57–1.00)
Globulin, Total: 2.9 g/dL (ref 1.5–4.5)
Glucose: 160 mg/dL — ABNORMAL HIGH (ref 70–99)
Potassium: 4.7 mmol/L (ref 3.5–5.2)
Sodium: 132 mmol/L — ABNORMAL LOW (ref 134–144)
Total Protein: 7.3 g/dL (ref 6.0–8.5)
eGFR: 65 mL/min/{1.73_m2} (ref >59)

## 2023-08-26 LAB — CBC
Hematocrit: 37.7 % (ref 34.0–46.6)
Hemoglobin: 12.2 g/dL (ref 11.1–15.9)
MCH: 30.1 pg (ref 26.6–33.0)
MCHC: 32.4 g/dL (ref 31.5–35.7)
MCV: 93 fL (ref 79–97)
Platelets: 257 10*3/uL (ref 150–450)
RBC: 4.05 x10E6/uL (ref 3.77–5.28)
RDW: 13.9 % (ref 11.7–15.4)
WBC: 8.6 10*3/uL (ref 3.4–10.8)

## 2023-08-27 ENCOUNTER — Encounter: Payer: Self-pay | Admitting: Cardiology

## 2023-08-27 ENCOUNTER — Ambulatory Visit: Payer: Medicare Other | Attending: Cardiology | Admitting: Cardiology

## 2023-08-27 ENCOUNTER — Encounter: Payer: Self-pay | Admitting: Physician Assistant

## 2023-08-27 VITALS — BP 132/90 | HR 99 | Ht 62.0 in | Wt 179.8 lb

## 2023-08-27 DIAGNOSIS — E785 Hyperlipidemia, unspecified: Secondary | ICD-10-CM

## 2023-08-27 DIAGNOSIS — I4821 Permanent atrial fibrillation: Secondary | ICD-10-CM | POA: Diagnosis not present

## 2023-08-27 DIAGNOSIS — I5032 Chronic diastolic (congestive) heart failure: Secondary | ICD-10-CM

## 2023-08-27 DIAGNOSIS — I2510 Atherosclerotic heart disease of native coronary artery without angina pectoris: Secondary | ICD-10-CM | POA: Diagnosis not present

## 2023-08-27 DIAGNOSIS — I10 Essential (primary) hypertension: Secondary | ICD-10-CM

## 2023-08-27 NOTE — Progress Notes (Signed)
 Cardiology Office Note    Date:  08/27/2023   ID:  ANALICIA SKIBINSKI, DOB 1946/04/19, MRN [id number]  PCP:  Mechele Claude, MD  Cardiologist: Armanda Magic, MD  Chief Complaint  Patient presents with   Coronary Artery Disease   Hypertension   Atrial Fibrillation   Hyperlipidemia   Congestive Heart Failure    History of Present Illness:    Amber Stephenson is a 78 y.o. female with past medical history of permanent atrial fibrillation (on Eliquis), HFpEF, HTN, HLD, Type 2 DM, Stage 2-3 CKD, asthma, OSA (intolerant to CPAP), Bipolar Disorder and CAD (nonobstructive CAD by cath in 01/2018).  Echo 7/22 showed EF of 65 to 70% with no regional wall motion abnormalities  She has had problems with right sided pleuritic CP that was felt to be musculoskeletal.  2D echo showed normal LVF EF 67-70% and mitral annular calcifications .  hsTrop minimally elevated at 38>30>17>15.  She has continued to have pleurtiitic CP on the right that is much worse with deep breathing.  Chest CT did not show any rib fx. She has had some SOB that she thinks is because she cannot take a deep breath in. Her frequent falls were felt to be related to polypharmacy/antihypertensive/diuretic use/narcotics/Benzos/psychotropic drugs.  She was recently hospitalized with a UTI complicated by afib with RVR.  She still has urinary frequency and was seen by her PCP yesterday.  She remains on antibx..   She is here today for followup.  She continues to have chronic right sided pleuritic CP although sometimes on the left and worse with deep inspiration.  It has not changed any from prior Office visits.  She has chronic SOB that she thinks is better.  She denies any LE edema, palpitations or syncope. She has some problems with sitting to standing. She also has been complaining of severe fatigue since she was dx with endometrial Ca and has been getting XRT.  Marland Kitchen She is compliant with her meds and is tolerating meds with no SE.    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   Benign paroxysmal positional vertigo due to bilateral vestibular disorder 05/20/2019   Bipolar 1 disorder (HCC) 01/23/2015   with GAD, benzo dependence   CAD (coronary artery disease)    Nonobstructive; Managed by Dr. Purvis Sheffield   Cardiomegaly 01/12/2018   CHF (congestive heart failure) 06/08/2022   Chronic back pain 01/04/2015   Chronic constipation 04/25/2020   Chronic diastolic heart failure (HCC) 05/30/2015   Chronic pain syndrome 08/22/2019   back pain, sacroiliitis   Chronic post-traumatic stress disorder (PTSD) 12/06/2020   Diabetic neuropathy (HCC) 02/06/2016   Dyslipidemia 09/24/2020   Essential hypertension    Functional diarrhea 10/26/2020   Herpes genitalis in women 07/16/2015   History of adenomatous polyp of colon 05/21/2019   Overview:   03/31/17: Colonoscopy: nonadvanced adenoma, microscopic colitis, f/u 5 yrs, [facility]/GAP   History of radiation therapy    Endometrial- 02/18/23-03/31/23-Dr. Antony Blackbird   Insomnia 01/23/2015   Migraine headache with aura 02/12/2016   Myofascial pain dysfunction syndrome 08/22/2019   Non-alcoholic fatty liver disease 01/12/2018   OSA (obstructive sleep apnea) 02/24/2019   10/09/2018 - HST  - AHI 40.6    Osteopenia 12/06/2020  Rx alendronate 35 mg.   Pinguecula 12/06/2020   Presbyopia 12/06/2020   Pulmonary hypertension    RLS (restless legs syndrome) 04/27/2015   Tremor, essential 12/11/2021   Type II diabetes mellitus (HCC) 09/24/2020    Past Surgical History:  Procedure Laterality Date   BREAST REDUCTION SURGERY     EYE SURGERY Right    cateracts   HAMMER TOE SURGERY     LEFT HEART CATH AND CORONARY ANGIOGRAPHY  N/A 02/03/2018   Procedure: LEFT HEART CATH AND CORONARY ANGIOGRAPHY;  Surgeon: [name], Peter M, MD;  Location: [facility] INVASIVE CV LAB;  Service: Cardiovascular;  Laterality: N/A;   REVERSE SHOULDER ARTHROPLASTY Right 08/19/2019   Procedure: REVERSE SHOULDER ARTHROPLASTY;  Surgeon: Beverely Low, MD;  Location: [facility] ORS;  Service: Orthopedics;  Laterality: Right;  interscalene block   SHOULDER SURGERY Right    "I BROKE MY SHOUDLER   THIGH SURGERY     "TO REMOVE A TUMOR "    Current Medications: Outpatient Medications Prior to Visit  Medication Sig Dispense Refill   albuterol (VENTOLIN HFA) 108 (90 Base) MCG/ACT inhaler Inhale 2 puffs into the lungs every 4 (four) hours as needed for wheezing or shortness of breath. 1 each 11   allopurinol (ZYLOPRIM) 100 MG tablet Take 100 mg by mouth daily.     apixaban (ELIQUIS) 5 MG TABS tablet Take 1 tablet (5 mg total) by mouth 2 (two) times daily. 180 tablet 3   ARIPiprazole (ABILIFY) 2 MG tablet Take 2 mg by mouth at bedtime.     clotrimazole (MYCELEX) 10 MG troche Take 10 mg by mouth in the morning and at bedtime.     Continuous Glucose Sensor (DEXCOM G7 SENSOR) MISC CHANGE SENSOR EVERY 10 DAYS 9 each 3   cyanocobalamin (VITAMIN B12) 1000 MCG tablet Take 1 tablet (1,000 mcg total) by mouth daily. 30 tablet 1   denosumab (PROLIA) 60 MG/ML SOSY injection Inject 60 mg into the skin every 6 (six) months. 1 mL 1   doxepin (SINEQUAN) 10 MG capsule Take 10 mg by mouth at bedtime.     escitalopram (LEXAPRO) 10 MG tablet Take 10 mg by mouth daily.     fluticasone-salmeterol (ADVAIR DISKUS) 100-50 MCG/ACT AEPB Inhale 1 puff into the lungs 2 (two) times daily. (Patient taking differently: Inhale 1 puff into the lungs 2 (two) times daily as needed (for flares).) 1 each 5   hydrOXYzine (VISTARIL) 25 MG capsule Take 25 mg by mouth 3 (three) times daily.     Insulin Disposable Pump (OMNIPOD 5 G7 PODS, GEN 5,) MISC 1 Device by Does not apply route every other day. 45 each 3    insulin glargine (LANTUS SOLOSTAR) 100 UNIT/ML Solostar Pen Inject 32 Units into the skin 2 (two) times daily. 60 mL 4   insulin lispro (HUMALOG KWIKPEN) 100 UNIT/ML KwikPen Max daily 45 units (Patient taking differently: Inject 6-45 Units into the skin 3 (three) times daily. Max daily 45 units) 45 mL 6   Insulin Pen Needle 29G X MISC 1 Device by Does not apply route daily in the afternoon. 400 each 3   levocetirizine (XYZAL ALLERGY 24HR) 5 MG tablet Take 1 tablet (5 mg total) by mouth every evening. For itch     lidocaine (XYLOCAINE) 2 % solution Use as directed 15 mLs in the mouth or throat every 4 (four) hours as needed for mouth pain. 100 mL 5   LORazepam (ATIVAN) 0.5 MG tablet Take 0.5-1 mg by mouth See admin  instructions. Take 0.5 mg by mouth in the morning and 1 mg at bedtime     losartan (COZAAR) 25 MG tablet Take 25 mg by mouth daily.     MAGNESIUM PO Take 1 tablet by mouth daily.     meclizine (ANTIVERT) 25 MG tablet Take 1 tablet (25 mg total) by mouth 3 (three) times daily as needed for dizziness. 60 tablet 1   metFORMIN (GLUCOPHAGE-XR) 500 MG 24 hr tablet Take 2 tablets (1,000 mg total) by mouth daily with breakfast. 180 tablet 3   methocarbamol 1000 MG TABS Take 1,000 mg by mouth every 6 (six) hours as needed for muscle spasms. 120 tablet 5   metoprolol tartrate (LOPRESSOR) 50 MG tablet Take 1 tablet (50 mg total) by mouth 2 (two) times daily. 180 tablet 2   naloxone (NARCAN) nasal spray 4 mg/0.1 mL Place 1 spray into the nose daily as needed (for over dose).     nystatin (MYCOSTATIN/NYSTOP) powder Apply 1 Application topically as needed (rash). 15 g 2   nystatin ointment (MYCOSTATIN) Apply 1 Application topically 2 (two) times daily as needed (for rashes). 30 g 1   omeprazole (PRILOSEC) 40 MG capsule Take 40 mg by mouth daily.     ondansetron (ZOFRAN) 4 MG tablet Take 1 tablet (4 mg total) by mouth every 8 (eight) hours as needed for nausea or vomiting. For cancer related  nausea 90 tablet 5   OXcarbazepine (TRILEPTAL) 150 MG tablet Take 1 tablet (150 mg total) by mouth 2 (two) times daily. 60 tablet 0   Oxycodone HCl 20 MG TABS Take 1 tablet (20 mg total) by mouth 2 (two) times daily as needed. Do Not Fill Before 08/09/2023 60 tablet 0   pregabalin (LYRICA) 200 MG capsule Take 200 mg by mouth 2 (two) times daily.     promethazine (PHENERGAN) 25 MG tablet Take 0.5-1 tablets (12.5-25 mg total) by mouth every 6 (six) hours as needed for refractory nausea / vomiting (cancer related nausea/vomiting). 120 tablet 5   rOPINIRole (REQUIP) 1 MG tablet Take 1 tablet (1 mg total) by mouth at bedtime. For leg cramps 30 tablet 5   senna-docusate (SENOKOT-S) 8.6-50 MG tablet Take 1 tablet by mouth at bedtime. 30 tablet 0   Suvorexant (BELSOMRA) 20 MG TABS Take 1 tablet (20 mg total) by mouth at bedtime as needed. (Patient taking differently: Take 20 mg by mouth at bedtime as needed (for sleep).) 30 tablet 5   tirzepatide (MOUNJARO) 10 MG/0.5ML Pen Inject 10 mg into the skin once a week. 6 mL 3   TYLENOL 500 MG tablet Take 500-1,000 mg by mouth every 6 (six) hours as needed for mild pain (pain score 1-3) or headache.     Vitamin D, Cholecalciferol, 25 MCG (1000 UT) TABS Take 1,000 Units by mouth daily in the afternoon.     atorvastatin (LIPITOR) 80 MG tablet Take 1 tablet (80 mg total) by mouth daily. (Patient taking differently: Take 80 mg by mouth every evening.) 90 tablet 3   potassium chloride (KLOR-CON) 10 MEQ tablet Take 1 tablet (10 mEq total) by mouth daily. 90 tablet 3   spironolactone (ALDACTONE) 25 MG tablet Take 0.5 tablets (12.5 mg total) by mouth daily. 15 tablet 2   benzonatate (TESSALON) 100 MG capsule TAKE 2 CAPSULES(200 MG) BY MOUTH THREE TIMES DAILY AS NEEDED FOR COUGH 30 capsule 0   Facility-Administered Medications Prior to Visit  Medication Dose Route Frequency Provider Last Rate Last Admin   bupivacaine-epinephrine (MARCAINE  W/ EPI) 0.5% -1:200000 injection     Anesthesia Intra-op Shelton Silvas, MD   12 mL at 01/21/19 0935     Allergies:   Iodine, Ivp dye [iodinated contrast media], Latex, Tape, and Tizanidine   Social History   Socioeconomic History   Marital status: Married    Spouse name: Karren Burly   Number of children: 3   Years of education: 15   Highest education level: Associate degree: academic program  Occupational History   Occupation: Retired    Comment: Chief Financial Officer  Tobacco Use   Smoking status: Never   Smokeless tobacco: Never  Vaping Use   Vaping status: Never Used  Substance and Sexual Activity   Alcohol use: Not Currently    Alcohol/week: 0.0 standard drinks of alcohol   Drug use: No   Sexual activity: Yes    Partners: Male    Birth control/protection: Post-menopausal  Other Topics Concern   Not on file  Social History Narrative   Lives at home with husband.    They have 3 children who live away - [place], [place], and [place].   She is from [place] and most of her family lives there.   Her husbands's family live nearby   Right handed.   Social Drivers of Corporate investment banker Strain: Low Risk  (08/13/2023)   Overall Financial Resource Strain (CARDIA)    Difficulty of Paying Living Expenses: Not hard at all  Food Insecurity: No Food Insecurity (08/24/2023)   Hunger Vital Sign    Worried About Running Out of Food in the Last Year: Never true    Ran Out of Food in the Last Year: Never true  Transportation Needs: No Transportation Needs (08/24/2023)   PRAPARE - Administrator, Civil Service (Medical): No    Lack of Transportation (Non-Medical): No  Physical Activity: Insufficiently Active (08/13/2023)   Exercise Vital Sign    Days of Exercise per Week: 3 days    Minutes of Exercise per Session: 20 min  Stress: No Stress Concern Present (08/13/2023)   [employer] of Occupational Health - Occupational Stress Questionnaire    Feeling of Stress : Only a little  Social Connections:  Moderately Integrated (08/19/2023)   Social Connection and Isolation Panel [NHANES]    Frequency of Communication with Friends and Family: More than three times a week    Frequency of Social Gatherings with Friends and Family: Once a week    Attends Religious Services: More than 4 times per year    Active Member of [employer] or Organizations: No    Attends Engineer, structural: Never    Marital Status: Married     Family History:  The patient's family history includes Alcohol abuse in her sister; Alzheimer's disease in her father; Diabetes in her brother, mother, and sister; Heart disease in her brother, mother, and sister; Mental illness in her brother; Stroke in her brother.   Review of Systems:    Please see the history of present illness.     All other systems reviewed and are otherwise negative except as noted above.   Physical Exam:    VS:  BP (!) 132/90   Pulse 99   Ht 5\' 2"  (1.575 m)   Wt 179 lb 12.8 oz (81.6 kg)   SpO2 96%   BMI 32.89 kg/m    GEN: Well nourished, well developed in no acute distress HEENT: Normal NECK: No JVD; No carotid bruits LYMPHATICS: No lymphadenopathy CARDIAC:irregularly irregular, no  murmurs, rubs, gallops RESPIRATORY:  Clear to auscultation without rales, wheezing or rhonchi  ABDOMEN: Soft, non-tender, non-distended MUSCULOSKELETAL:  No edema; No deformity  SKIN: Warm and dry NEUROLOGIC:  Alert and oriented x 3 PSYCHIATRIC:  Normal affect  Wt Readings from Last 3 Encounters:  08/27/23 179 lb 12.8 oz (81.6 kg)  08/25/23 183 lb 12.8 oz (83.4 kg)  08/20/23 182 lb 12.2 oz (82.9 kg)     Studies/Labs Reviewed:     Recent Labs: 12/25/2022: TSH 2.070 01/13/2023: Magnesium 2.2 08/19/2023: B Natriuretic Peptide 248.3 08/25/2023: ALT 14; BUN 9; Creatinine, Ser 0.91; Hemoglobin 12.2; Platelets 257; Potassium 4.7; Sodium 132   Lipid Panel    Component Value Date/Time   CHOL 150 08/25/2023 1117   TRIG 136 08/25/2023 1117   HDL 58  08/25/2023 1117   CHOLHDL 2.6 08/25/2023 1117   CHOLHDL 6.3 01/13/2023 1157   VLDL 27 01/13/2023 1157   LDLCALC 68 08/25/2023 1117   LDLDIRECT 62 03/17/2016 1614    Additional studies/ records that were reviewed today include:   Cardiac Catheterization: 01/2018 Prox LAD lesion is 40% stenosed. Mid LAD lesion is 30% stenosed. Ost 1st Mrg to 1st Mrg lesion is 30% stenosed. LV end diastolic pressure is moderately elevated.   1. Nonobstructive CAD with left dominant circulation 2. Moderately elevated LVEDP (post hydration)   Plan: medical therapy. Will resume Eliquis this pm. Continue diuretic therapy.   No indication for antiplatelet therapy at this time.  Echocardiogram: 11/2020 IMPRESSIONS     1. Left ventricular ejection fraction, by estimation, is 65 to 70%. The  left ventricle has normal function. The left ventricle has no regional  wall motion abnormalities. There is moderate left ventricular hypertrophy.  Left ventricular diastolic  parameters are indeterminate.   2. Right ventricular systolic function is low normal. The right  ventricular size is mildly enlarged. There is normal pulmonary artery  systolic pressure.   3. Left atrial size was severely dilated.   4. Right atrial size was severely dilated.   5. The mitral valve is abnormal. Trivial mitral valve regurgitation. No  evidence of mitral stenosis. Moderate mitral annular calcification.   6. The aortic valve was not well visualized. Aortic valve regurgitation  is not visualized. No aortic stenosis is present.   7. The inferior vena cava is dilated in size with >50% respiratory  variability, suggesting right atrial pressure of 8 mmHg.   Assessment:    1. Permanent atrial fibrillation (HCC)   2. Chronic diastolic heart failure (HCC)   3. Coronary artery disease involving native coronary artery of native heart without angina pectoris   4. Essential hypertension   5. Hyperlipidemia LDL goal <70        Plan:   In order of problems listed above:  1. Permanent Atrial Fibrillation -She remains in atrial fibrillation on exam today with good heart rate control and denies any palpitations -Denies any bleeding problems on DOAC -Continue prescription drug management with apixaban 5 mg twice daily and Lopressor 50 mg twice daily with as needed refills -I have personally reviewed and interpreted outside labs performed by patient's PCP which showed hemoglobin 11.2, serum creatinine 0.71 and potassium 4 on 08/19/2023  2. HFpEF -2D echo 09/02/2022 showed a preserved EF of 65-70% with mild RV enlargement but normal RV function which is likely due to known OSA and asthma. She has been intolerant to CPAP due to claustrophobia.  -2D echo 01/2023 with normal LVF and normal RV size and RVF with  no PTHN -She does not appear overloaded on exam today -Continue prescription drug management with spironolactone 12.5 mg daily, Lopressor 50 mg twice daily And losartan 25 mg daily with as needed refills  3. CAD -Prior cath in 01/2018 showed nonobstructive CAD as outlined above.  -She has been having atypical CP that I suspect is MSK but given her SOB and now worsening fatigue (she is on XRT that could explain fatigue) I will get a Stress PET CT to rule out ischemia. -Informed Consent   Shared Decision Making/Informed Consent The risks [chest pain, shortness of breath, cardiac arrhythmias, dizziness, blood pressure fluctuations, myocardial infarction, stroke/transient ischemic attack, nausea, vomiting, allergic reaction, radiation exposure, metallic taste sensation and life-threatening complications (estimated to be 1 in 10,000)], benefits (risk stratification, diagnosing coronary artery disease, treatment guidance) and alternatives of a cardiac PET stress test were discussed in detail with Amber Stephenson and she agrees to proceed.    -Continue prescription drug management with Lopressor 50 mg twice daily and  atorvastatin 40 mg daily -No aspirin due to DOAC    4. HTN -BP controlled on today -Continue prescription drug management with Lopressor 50 mg twice daily, spironolactone 12.5 mg daily, losartan 25 mg daily with as needed refills  5. HLD - Followed by her PCP.  -LDL goal less than 70 - I have personally reviewed and interpreted outside labs performed by patient's PCP which showed LDL 45, HDL 48 on 06/15/2023 and ALT 17 on 08/19/2023  6.  Mildly dilated aortic root -40mm on cho 01/2023 -repeat echo 01/2024                      Followup with me or extender  in 1 year   Medication Adjustments/Labs and Tests Ordered: Current medicines are reviewed at length with the patient today.  Concerns regarding medicines are outlined above.  Medication changes, Labs and Tests ordered today are listed in the Patient Instructions below.   Signed, Armanda Magic, MD  08/27/2023 3:50 PM    [facility] [address] [place], [place] [address] Phone: [phone] Fax: [phone]

## 2023-08-27 NOTE — Addendum Note (Signed)
 Addended by: Frutoso Schatz on: 08/27/2023 03:59 PM   Modules accepted: Orders

## 2023-08-27 NOTE — Patient Instructions (Signed)
 Medication Instructions:  Your physician recommends that you continue on your current medications as directed. Please refer to the Current Medication list given to you today.  *If you need a refill on your cardiac medications before your next appointment, please call your pharmacy*  Testing/Procedures: Echocardiogram Your physician has requested that you have an echocardiogram. Echocardiography is a painless test that uses sound waves to create images of your heart. It provides your doctor with information about the size and shape of your heart and how well your heart's chambers and valves are working. This procedure takes approximately one hour. There are no restrictions for this procedure. Please do NOT wear cologne, perfume, aftershave, or lotions (deodorant is allowed). Please arrive 15 minutes prior to your appointment time.  Cardiac PET Stress Test See instructions below  Follow-Up: At [facility], you and your health needs are our priority.  As part of our continuing mission to provide you with exceptional heart care, our providers are all part of one team.  This team includes your primary Cardiologist (physician) and Advanced Practice Providers or APPs (Physician Assistants and Nurse Practitioners) who all work together to provide you with the care you need, when you need it.  Your next appointment:   1 year  Provider:   Armanda Magic, MD      Other Instructions    Please report to Radiology at the [facility] Main Entrance 30 minutes early for your test.  [address] [place], [place] [address]                         OR   Please report to Radiology at [facility] Main Entrance, medical mall, 30 mins prior to your test.  [address]  [place], [place]  How to Prepare for Your Cardiac PET/CT Stress Test:  Nothing to eat or drink, except water, 3 hours prior to arrival time.  NO caffeine/decaffeinated products, or  chocolate 12 hours prior to arrival. (Please note decaffeinated beverages (teas/coffees) still contain caffeine).  If you have caffeine within 12 hours prior, the test will need to be rescheduled.  Medication instructions: Do not take erectile dysfunction medications for 72 hours prior to test (sildenafil, tadalafil) Do not take nitrates (isosorbide mononitrate, Ranexa) the day before or day of test Do not take tamsulosin the day before or morning of test Hold theophylline containing medications for 12 hours. Hold Dipyridamole 48 hours prior to the test.  Diabetic Preparation: If able to eat breakfast prior to 3 hour fasting, you may take all medications, including your insulin. Do not worry if you miss your breakfast dose of insulin - start at your next meal. If you do not eat prior to 3 hour fast-Hold all diabetes (oral and insulin) medications. Patients who wear a continuous glucose monitor MUST remove the device prior to scanning.  You may take your remaining medications with water.  NO perfume, cologne or lotion on chest or abdomen area. FEMALES - Please avoid wearing dresses to this appointment.  Total time is 1 to 2 hours; you may want to bring reading material for the waiting time.  IF YOU THINK YOU MAY BE PREGNANT, OR ARE NURSING PLEASE INFORM THE TECHNOLOGIST.  In preparation for your appointment, medication and supplies will be purchased.  Appointment availability is limited, so if you need to cancel or reschedule, please call the Radiology Department Scheduler at [phone] 24 hours in advance to avoid  a cancellation fee of $100.00  What to Expect When you Arrive:  Once you arrive and check in for your appointment, you will be taken to a preparation room within the Radiology Department.  A technologist or Nurse will obtain your medical history, verify that you are correctly prepped for the exam, and explain the procedure.  Afterwards, an IV will be started in your arm and  electrodes will be placed on your skin for EKG monitoring during the stress portion of the exam. Then you will be escorted to the PET/CT scanner.  There, staff will get you positioned on the scanner and obtain a blood pressure and EKG.  During the exam, you will continue to be connected to the EKG and blood pressure machines.  A small, safe amount of a radioactive tracer will be injected in your IV to obtain a series of pictures of your heart along with an injection of a stress agent.    After your Exam:  It is recommended that you eat a meal and drink a caffeinated beverage to counter act any effects of the stress agent.  Drink plenty of fluids for the remainder of the day and urinate frequently for the first couple of hours after the exam.  Your doctor will inform you of your test results within 7-10 business days.  For more information and frequently asked questions, please visit our website: [email or web address]  For questions about your test or how to prepare for your test, please call: Cardiac Imaging Nurse Navigators Office: [phone]       1st Floor: - Lobby - Registration  - Pharmacy  - Lab - Cafe  2nd Floor: - PV Lab - Diagnostic Testing (echo, CT, nuclear med)  3rd Floor: - Vacant  4th Floor: - TCTS (cardiothoracic surgery) - AFib Clinic - Structural Heart Clinic - Vascular Surgery  - Vascular Ultrasound  5th Floor: - HeartCare Cardiology (general and EP) - Clinical Pharmacy for coumadin, hypertension, lipid, weight-loss medications, and med management appointments    Valet parking services will be available as well.

## 2023-08-27 NOTE — Addendum Note (Signed)
 Addended byFrutoso Schatz on: 08/27/2023 04:02 PM   Modules accepted: Orders

## 2023-08-31 ENCOUNTER — Encounter
Payer: Medicare Other | Attending: Physical Medicine and Rehabilitation | Admitting: Physical Medicine and Rehabilitation

## 2023-08-31 ENCOUNTER — Encounter: Payer: Self-pay | Admitting: Physical Medicine and Rehabilitation

## 2023-08-31 ENCOUNTER — Telehealth: Payer: Self-pay

## 2023-08-31 VITALS — BP 139/82 | HR 82 | Ht 62.0 in | Wt 185.2 lb

## 2023-08-31 DIAGNOSIS — G894 Chronic pain syndrome: Secondary | ICD-10-CM | POA: Diagnosis not present

## 2023-08-31 DIAGNOSIS — Z5181 Encounter for therapeutic drug level monitoring: Secondary | ICD-10-CM | POA: Insufficient documentation

## 2023-08-31 DIAGNOSIS — Z79891 Long term (current) use of opiate analgesic: Secondary | ICD-10-CM | POA: Diagnosis not present

## 2023-08-31 MED ORDER — OXYCODONE HCL ER 20 MG PO T12A: 20.0000 mg | EXTENDED_RELEASE_TABLET | Freq: Two times a day (BID) | ORAL | 0 refills | Status: DC

## 2023-08-31 MED ORDER — OXYCODONE HCL 20 MG PO TABS: 1.0000 | ORAL_TABLET | Freq: Two times a day (BID) | ORAL | 0 refills | Status: DC | PRN

## 2023-08-31 MED ORDER — METHOCARBAMOL 1000 MG PO TABS
1000.0000 mg | ORAL_TABLET | Freq: Four times a day (QID) | ORAL | 5 refills | Status: AC | PRN
Start: 2023-08-31 — End: ?

## 2023-08-31 NOTE — Patient Instructions (Signed)
 Patient is a 78 yr old female with hx of  CAD, pulmonary HTN- and DM- with CKD- Stage III here for right low back pain with sciatica  As well as upper back/neck pain/myofascial pain. Pt is here for chronic pain f/u  with recent dx of uterine cancer- - undergoing treatment-       Pt doesn't feel trigger point injections would be helpful- which is fine.   2.  Con't Oxycontin 20 mg 2x/day- #60- - we discussed using a higher dose- however I'm not sure that's the best choice Send in 2 months supply- to be filled 4/7 and 10/05/23  3. We discussed getting a pain pump, but I would be concerned esp because I wouldn't want her to lose access to an MRI- so could only get a Medtronic pump.    4. Refill Robaxin 1000 mg 4x/day for pain.  Muscle relaxant  5. Con't TENS and use the exercise ball t sit on- work up form 15 minutes to 60 minutes if possible- replace time you would sit on sofa with ball.   6.  Using O2 during day prn and continuous at night. Sats 95% on RA today.    7. F/U in2 months with Riley Lam and 4 months with me- chronic back pain.

## 2023-08-31 NOTE — Telephone Encounter (Signed)
 Copied from CRM [id number]. Topic: Clinical - Medical Advice >> Aug 31, 2023  3:19 PM Elle L wrote: Reason for CRM: The patient was returning a call from the office regarding her urinalysis results. I read the note to her verbatim and she expressed understanding but is requesting to see if she should stop or continue her antibiotics. Her call back number is [phone].  She is also having her teeth pulled next month and is requesting to see if she can be off of her blood thinners for 2-3 days.

## 2023-08-31 NOTE — Progress Notes (Signed)
 Subjective:    Patient ID: Amber Stephenson, female    DOB: 1945/07/12, 78 y.o.   MRN: [id number]  HPI  Patient is a 78 yr old female with hx of  CAD, pulmonary HTN- and DM- with CKD- Stage III here for right low back pain with sciatica  As well as upper back/neck pain/myofascial pain. Pt is here for chronic pain f/u  with recent dx of uterine cancer- - undergoing treatment-    Had a fall in January per note by NP.   Finished radiation last year-  Was in hospital 2 weeks ago Had UTI-couldn't get rid of- for month of March- and affected heart - set off Afib with RVR.  UTI was Enterococcus. Was treated with Unsayn. And sent home on ABX.   Had to go back to See MD- and put back on another ABX since hadn't gone away.  Finally feels like it's gone away.   Wasn't sleeping-  Got Belsomra back- makes a big difference with sleep.  Dreams good- but OK, but sometimes wakes her up.   Pain about the same to slightly worse.  Really having to do exercises 2x/day- stretching-  Esp on R side-  Helps a lot when she does stretching.    Went and got ball- was too small for her height- wouldn't take it back- wouldn't stay inflated.  Trying to find a high quality one.   Not feeling good today- doesn't know why.   Every Am has to count doors to figure out what bedroom she's in.  But hasn't changed bedrooms.  No other meds have been added other than ABX.    Sees Oncologist next month.       Pain Inventory Average Pain 8 Pain Right Now 8 My pain is intermittent and sharp  In the last 24 hours, has pain interfered with the following? General activity 8 Relation with others 0 Enjoyment of life 0 What TIME of day is your pain at its worst? evening Sleep (in general) Good  Pain is worse with: walking, bending, standing, and some activites Pain improves with: rest, heat/ice, therapy/exercise, medication, and TENS Relief from Meds: 7  Family History  Problem Relation Age of Onset    Diabetes Mother    Heart disease Mother    Alzheimer's disease Father    Heart disease Sister        CABG   Diabetes Sister    Alcohol abuse Sister    Stroke Brother    Heart disease Brother    Mental illness Brother    Diabetes Brother    Breast cancer Neg Hx    Ovarian cancer Neg Hx    Colon cancer Neg Hx    Endometrial cancer Neg Hx    Social History   Socioeconomic History   Marital status: Married    Spouse name: Financial planner   Number of children: 3   Years of education: 15   Highest education level: Associate degree: academic program  Occupational History   Occupation: Retired    Comment: Chief Financial Officer  Tobacco Use   Smoking status: Never   Smokeless tobacco: Never  Vaping Use   Vaping status: Never Used  Substance and Sexual Activity   Alcohol use: Not Currently    Alcohol/week: 0.0 standard drinks of alcohol   Drug use: No   Sexual activity: Yes    Partners: Male    Birth control/protection: Post-menopausal  Other Topics Concern   Not on file  Social History Narrative  Lives at home with husband.    They have 3 children who live away - [place], [place], and [place].   She is from [place] and most of her family lives there.   Her husbands's family live nearby   Right handed.   Social Drivers of Corporate investment banker Strain: Low Risk  (08/13/2023)   Overall Financial Resource Strain (CARDIA)    Difficulty of Paying Living Expenses: Not hard at all  Food Insecurity: No Food Insecurity (08/24/2023)   Hunger Vital Sign    Worried About Running Out of Food in the Last Year: Never true    Ran Out of Food in the Last Year: Never true  Transportation Needs: No Transportation Needs (08/24/2023)   PRAPARE - Administrator, Civil Service (Medical): No    Lack of Transportation (Non-Medical): No  Physical Activity: Insufficiently Active (08/13/2023)   Exercise Vital Sign    Days of Exercise per Week: 3 days    Minutes of Exercise per Session: 20 min   Stress: No Stress Concern Present (08/13/2023)   [employer] of Occupational Health - Occupational Stress Questionnaire    Feeling of Stress : Only a little  Social Connections: Moderately Integrated (08/19/2023)   Social Connection and Isolation Panel [NHANES]    Frequency of Communication with Friends and Family: More than three times a week    Frequency of Social Gatherings with Friends and Family: Once a week    Attends Religious Services: More than 4 times per year    Active Member of [employer] or Organizations: No    Attends Engineer, structural: Never    Marital Status: Married   Past Surgical History:  Procedure Laterality Date   BREAST REDUCTION SURGERY     EYE SURGERY Right    cateracts   HAMMER TOE SURGERY     LEFT HEART CATH AND CORONARY ANGIOGRAPHY N/A 02/03/2018   Procedure: LEFT HEART CATH AND CORONARY ANGIOGRAPHY;  Surgeon: [name], Peter M, MD;  Location: [facility] INVASIVE [facility];  Service: Cardiovascular;  Laterality: N/A;   REVERSE SHOULDER ARTHROPLASTY Right 08/19/2019   Procedure: REVERSE SHOULDER ARTHROPLASTY;  Surgeon: Beverely Low, MD;  Location: [facility];  Service: Orthopedics;  Laterality: Right;  interscalene block   SHOULDER SURGERY Right    "I BROKE MY SHOUDLER   THIGH SURGERY     "TO REMOVE A TUMOR "   Past Surgical History:  Procedure Laterality Date   BREAST REDUCTION SURGERY     EYE SURGERY Right    cateracts   HAMMER TOE SURGERY     LEFT HEART CATH AND CORONARY ANGIOGRAPHY N/A 02/03/2018   Procedure: LEFT HEART CATH AND CORONARY ANGIOGRAPHY;  Surgeon: [name], Peter M, MD;  Location: [facility] INVASIVE CV LAB;  Service: Cardiovascular;  Laterality: N/A;   REVERSE SHOULDER ARTHROPLASTY Right 08/19/2019   Procedure: REVERSE SHOULDER ARTHROPLASTY;  Surgeon: Beverely Low, MD;  Location: [facility];  Service: Orthopedics;  Laterality: Right;  interscalene block   SHOULDER SURGERY Right    "I BROKE MY SHOUDLER   THIGH SURGERY     "TO REMOVE A TUMOR "   Past  Medical History:  Diagnosis Date   Allergic rhinitis 02/24/2019   Ambulatory dysfunction 04/23/2020   Atrial fibrillation with RVR (HCC) 05/19/2017   Back pain/sacroiliitis--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   Benign paroxysmal positional vertigo due to bilateral vestibular  disorder 05/20/2019   Bipolar 1 disorder (HCC) 01/23/2015   with GAD, benzo dependence   CAD (coronary artery disease)    Nonobstructive; Managed by Dr. Purvis Sheffield   Cardiomegaly 01/12/2018   CHF (congestive heart failure) 06/08/2022   Chronic back pain 01/04/2015   Chronic constipation 04/25/2020   Chronic diastolic heart failure (HCC) 05/30/2015   Chronic pain syndrome 08/22/2019   back pain, sacroiliitis   Chronic post-traumatic stress disorder (PTSD) 12/06/2020   Diabetic neuropathy (HCC) 02/06/2016   Dyslipidemia 09/24/2020   Essential hypertension    Functional diarrhea 10/26/2020   Herpes genitalis in women 07/16/2015   History of adenomatous polyp of colon 05/21/2019   Overview:   03/31/17: Colonoscopy: nonadvanced adenoma, microscopic colitis, f/u 5 yrs, [facility]/GAP   History of radiation therapy    Endometrial- 02/18/23-03/31/23-Dr. Antony Blackbird   Insomnia 01/23/2015   Migraine headache with aura 02/12/2016   Myofascial pain dysfunction syndrome 08/22/2019   Non-alcoholic fatty liver disease 01/12/2018   OSA (obstructive sleep apnea) 02/24/2019   10/09/2018 - HST  - AHI 40.6    Osteopenia 12/06/2020   Rx alendronate 35 mg.   Pinguecula 12/06/2020   Presbyopia 12/06/2020   Pulmonary hypertension    RLS (restless legs syndrome) 04/27/2015   Tremor, essential 12/11/2021   Type II diabetes mellitus (HCC) 09/24/2020   BP 139/82   Pulse 82   Ht 5\' 2"  (1.575 m)   Wt 185 lb 3.2 oz (84 kg)   SpO2 94%   BMI 33.87 kg/m   Opioid Risk Score:   Fall Risk Score:  `1  Depression screen [facility] 2/9      08/31/2023    1:07 PM 08/31/2023   12:53 PM 08/25/2023   12:07 PM 08/13/2023    2:41 PM 07/10/2023    2:57 PM 06/30/2023    3:56 PM 06/15/2023    1:16 PM  Depression screen PHQ 2/9  Decreased Interest 2 0 0 0 0 0 0  Down, Depressed, Hopeless 2 0 0 0 0 0 0  PHQ - 2 Score 4 0 0 0 0 0 0     Review of Systems  Musculoskeletal:  Positive for back pain and gait problem.  All other systems reviewed and are negative.      Objective:   Physical Exam  Awake, alert, appropriate, appears less interactive, slightly less color, NAD But Hb was 10.2  Slightly delayed responses but think it's due to thinking about questions more than anything   Very TTP over lower R paraspinals- and down into sacral paraspinals.     Mild swelling of R proximal calf- with bruising slowly healing.  Assessment & Plan:   Patient is a 78 yr old female with hx of  CAD, pulmonary HTN- and DM- with CKD- Stage III here for right low back pain with sciatica  As well as upper back/neck pain/myofascial pain. Pt is here for chronic pain f/u  with recent dx of uterine cancer- - undergoing treatment-       Pt doesn't feel trigger point injections would be helpful- which is fine.   2.  Con't Oxycontin 20 mg 2x/day- #60- - we discussed using a higher dose- however I'm not sure that's the best choice Send in 2 months supply- to be filled 4/7 and 10/05/23  3. We discussed getting a pain pump, but I would be concerned esp because I wouldn't want her to lose access to an MRI- so could only get a Medtronic pump.  4. Refill Robaxin 1000 mg 4x/day for pain.  Muscle relaxant  5. Con't TENS and use the exercise ball t sit on- work up form 15 minutes to 60 minutes if possible- replace time you would sit on sofa with ball.   6.  Using O2 during day prn and continuous at night. Sats 95% on RA today.    7. F/U in2 months with Riley Lam and 4 months with me- chronic back pain.    I spent a total of 31   minutes on total care today-  >50% coordination of care- due to discussion about exercise ball instructions- work on posture- and TENS- and refill of meds- also d/w pt about Pain pump options.

## 2023-09-01 ENCOUNTER — Other Ambulatory Visit: Payer: Self-pay | Admitting: *Deleted

## 2023-09-01 ENCOUNTER — Ambulatory Visit: Payer: Self-pay

## 2023-09-01 NOTE — Telephone Encounter (Signed)
 Copied from CRM [id number]. Topic: Clinical - Red Word Triage >> Sep 01, 2023  7:34 AM Nyra Capes wrote: Red Word that prompted transfer to Nurse Triage: patient calling in, patient has vaginal itching, burning, discharge.  Chief Complaint: itching around vaginal area, burning, discharge Symptoms: see above Frequency: started two days ago after finishing antibiotic for UTI. Pertinent Negatives: Patient denies fever, abd pain, rash Disposition: [] ED /[] Urgent Care (no appt availability in office) / [] Appointment(In office/virtual)/ []  [facility]/ [x] Home Care/ [] Refused Recommended Disposition /[] [place]/ []  Follow-up with PCP Additional Notes: declined office visit; was just seen for a UTI.  States feels like she has a yeast infection and would like a medication called in.  Care advice given, denies questions; instructed to go to ER if becomes worse.   Reason for Disposition  MODERATE-SEVERE itching (i.e., interferes with school, work, or sleep)  Answer Assessment - Initial Assessment Questions 1. SYMPTOM: "What's the main symptom you're concerned about?" (e.g., pain, itching, dryness)     Itching, discharge, burning 2. LOCATION: "Where is the  vaginal  located?" (e.g., inside/outside, left/right)     Outside and inside 3. ONSET: "When did the  itching  start?"     Two days ago 4. PAIN: "Is there any pain?" If Yes, ask: "How bad is it?" (Scale: 1-10; mild, moderate, severe)   -  MILD (1-3): Doesn't interfere with normal activities.    -  MODERATE (4-7): Interferes with normal activities (e.g., work or school) or awakens from sleep.     -  SEVERE (8-10): Excruciating pain, unable to do any normal activities.     mild 5. ITCHING: "Is there any itching?" If Yes, ask: "How bad is it?" (Scale: 1-10; mild, moderate, severe)     severe 6. CAUSE: "What do you think is causing the discharge?" "Have you had the same problem before? What happened then?"     Yeast  infection 7. OTHER SYMPTOMS: "Do you have any other symptoms?" (e.g., fever, itching, vaginal bleeding, pain with urination, injury to genital area, vaginal foreign body)     Itching, discharge, pain with urination 8. PREGNANCY: "Is there any chance you are pregnant?" "When was your last menstrual period?"     na  Protocols used: Vaginal Symptoms-A-AH

## 2023-09-01 NOTE — Telephone Encounter (Signed)
 Pt unable to come to office due to transportation issues. Informed pt I would send message. Pt verbalized understanding

## 2023-09-01 NOTE — Patient Outreach (Signed)
 Care Management  Transitions of Care Program Transitions of Care Post-discharge week 2  09/01/2023 Name: Amber Stephenson MRN: [id number] DOB: 09-23-1945  Subjective: Amber Stephenson is a 78 y.o. year old female who is a primary care patient of Stacks, Broadus John, MD. The Care Management team was unable to reach the patient by phone to assess and address transitions of care needs.   Plan: Additional outreach attempts will be made to reach the patient enrolled in the [facility] Program (Post Inpatient/ED Visit).  Irving Shows [facility], BSN RN Care Manager/ Transition of Care [place]/ [facility] [phone]

## 2023-09-02 ENCOUNTER — Ambulatory Visit (INDEPENDENT_AMBULATORY_CARE_PROVIDER_SITE_OTHER): Payer: Medicare Other | Admitting: Physician Assistant

## 2023-09-02 ENCOUNTER — Encounter: Payer: Self-pay | Admitting: Physician Assistant

## 2023-09-02 ENCOUNTER — Telehealth: Payer: Self-pay | Admitting: *Deleted

## 2023-09-02 ENCOUNTER — Other Ambulatory Visit: Payer: Self-pay | Admitting: Nurse Practitioner

## 2023-09-02 VITALS — BP 126/71 | HR 78 | Resp 20 | Ht 62.0 in | Wt 186.0 lb

## 2023-09-02 DIAGNOSIS — B3781 Candidal esophagitis: Secondary | ICD-10-CM

## 2023-09-02 DIAGNOSIS — G3184 Mild cognitive impairment, so stated: Secondary | ICD-10-CM | POA: Diagnosis not present

## 2023-09-02 MED ORDER — CLOTRIMAZOLE 1 % VA CREA: 1.0000 | TOPICAL_CREAM | Freq: Every day | VAGINAL | 0 refills | Status: DC

## 2023-09-02 MED ORDER — DONEPEZIL HCL 5 MG PO TABS: 5.0000 mg | ORAL_TABLET | Freq: Every morning | ORAL | 11 refills | Status: DC

## 2023-09-02 NOTE — Patient Outreach (Signed)
 Care Management  Transitions of Care Program Transitions of Care Post-discharge week 2- 2nd attempt  09/02/2023 Name: Amber Stephenson MRN: [id number] DOB: 1946-05-28  Subjective: Amber Stephenson is a 78 y.o. year old female who is a primary care patient of Stacks, Broadus John, MD. The Care Management team was unable to reach the patient by phone to assess and address transitions of care needs.   Plan: Additional outreach attempts will be made to reach the patient enrolled in the [facility] Program (Post Inpatient/ED Visit).  Irving Shows [facility], BSN RN Care Manager/ Transition of Care [place]/ [facility] [phone]

## 2023-09-02 NOTE — Progress Notes (Signed)
 Assessment/Plan:   Memory Impairment   Amber Stephenson is a very pleasant 78 y.o. RH female with a history of CAD, CHF, Afib with a presentation to the ED with RVR, on 08/19/2023, chronic respiratory failure on O2, chronic pain, OSA, endometrial cancer on XRT  presenting today in follow-up for evaluation of memory loss.  A prior neuropsych evaluation in March 2024 was inconclusive given her psychiatric history along with polypharmacy.  He is yet to start Aricept because she read the side effects and was afraid to take it.  Discussed again the main side effect of the medication, benefits and risk, and she agrees to give it a try, in an effort to slow down any cognitive decline.  In today's visit, her memory has improved, with an MMSE of 30/30.  Her mood is improved as well after reducing some of the medications that she was priorly taking.   Recommendations:   Follow up in  6 months. Continue donepezil 5 mg daily, side effects discussed Recommend good control of cardiovascular risk factors Continue to control mood as per PCP and psychiatry.  She is on Abilify, doxepin, escitalopram, hydroxyzine, Ativan, Trileptal. Polypharmacy needs to be addressed, currently can affect her memory including pain medications, muscle relaxers, mood medications.   Follow-up with pain clinic for chronic pain Follow-up with pulmonary for OSA and CPAP.  Continue oxygen at night and prn during the day   Subjective:   This patient is accompanied in the office by her husband who supplements the history. Previous records as well as any outside records available were reviewed prior to todays visit.   Patient was last seen on 03/03/2023     Any changes in memory since last visit? "About the same if not better". "They are some periods or moments"  She has some difficulty remembering recent conversations and names of people.  With oxygen during the day she is more alert. 2 days ago, I "switched days" "I confused day 1  on day 2, a time shift "-she says. LTM is excellent. repeats oneself?  Endorsed Disoriented when walking into a room?  Patient denies   Misplacing objects?  Patient denies   Wandering behavior?   denies   Any personality changes since last visit?   denies   Any worsening depression?: denies.  She is under psychiatric care Hallucinations or paranoia?  Endorsed, she senses people when coming out of her sleep. Seizures?   denies    Any sleep changes? Sleeps well.  Does report some vivid dreams, denies REM behavior or sleepwalking   Sleep apnea?  Endorsed, on CPAP  Any hygiene concerns?   denies   Independent of bathing and dressing?  Endorsed  Does the patient needs help with medications?  Husband is in charge  Who is in charge of the finances?  Husband is in charge    Any changes in appetite?  Denies. She Mounjaro, lost 20 lbs   Patient have trouble swallowing?  denies   Does the patient cook? No    Any headaches?    Denies.   Vision changes? Denies. Chronic pain?  She has chronic back pain, multiple medications. Ambulates with difficulty?  In view of chronic back pain and mobility is limited, but she is able to walk without a walker. Recent falls or head injuries?    denies      Unilateral weakness, numbness or tingling?   denies   Any tremors?  Denies.   Any anosmia?  denies   Any incontinence of urine?  Endorsed, had recurrent UTI on March 2025 with Enterococcus and Proteus  any bowel dysfunction?  denies      Patient lives with her husband  Does the patient drive?  No longer drives      Neuropsychological evaluation 08/08/22  Scores across stand-alone and embedded performance validity measures were variable. As such, while there remains the potential that current scores reflect true impairment and progressive decline (see below), these also may reflect [name] occasionally zoning out while attempting cognitive tasks, coupled with significant psychiatric distress, acute on chronic  pain, and reported sleep dysfunction. Briefly, results suggested diffuse impairment impacting nearly all assessed cognitive domains. Performances were appropriate across confrontation naming and visuospatial abilities, while some variability was seen across recognition/consolidation aspects of memory.       History on Initial Assessment 07/17/2017: This is a 77 year old right-handed woman with a history of hypertension, hyperlipidemia, diabetes, atrial fibrillation, chronic pain, bipolar disorder, presenting for evaluation of memory changes. She feels her memory has gotten really bad over the past 2 months. She cannot remember words/word-finding difficulties. She "does not think it's Alzheimer's" but feels like she is not paying attention. Family reminds her that they had told her something already previously. Her husband has to remind her of her medications. They put her pills together in a pillbox. Her husband is in charge of finances. She stopped driving 4 years ago, she denied getting lost when she used to drive. She started noticing memory changes after she was started on a medication after she had her colonoscopy. She has also been dealing with a lot of pain, she states her Percocet dose was cut in half a month ago and this is causing her a lot of issues. She has poor sleep, 3 hours at the most. She was recently started on Trazodone by her psychiatrist, which may be helping. Her father had Alzheimer's disease. She denies any history of significant head injuries, no alcohol use.   She has frequent headaches, around 20 headache days a month with stabbing pain in the frontal region that can last up to 3 days, with associated nausea/vomiting. If she takes medication, pain lasts up to 2 hours. She has dizziness at least 1-2 times a day where it feels like the room is spinning. She has had falls for "no clear reason," last fall was 3 months ago. She has chronic neck and back pain, no focal  numbness/tingling/weakness. She has tremors in both hands and has a diagnosis of essential tremor, it is "very difficult to do things," it has affected her handwriting, computer use. No family history of tremors. No bowel/bladder dysfunction or anosmia.      I personally reviewed MRI brain without contrast done 05/13/17 which did not show any acute changes. There was mild diffuse atrophy and mild to moderate chronic microvascular disease.   Neuropsychological testing in 04/2019 showed primary areas of impairment with processing speed and attention/concentration. 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 She also scored in the mild range of anxiety and depression, and it was felt that the combination of these with daily headaches, dizziness, sleep apnea, and chronic pain, negatively influence day to day cognitive function.    MRI brain 04/28/21  1. No acute intracranial process. 2. Mildly advanced atrophy for age, with a suggestion of slightly greater atrophy in the right temporal lobe.  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   Benign paroxysmal positional vertigo due to bilateral vestibular disorder 05/20/2019   Bipolar 1 disorder (HCC) 01/23/2015   with GAD, benzo dependence   CAD (coronary artery disease)    Nonobstructive; Managed by Dr. Purvis Sheffield   Cardiomegaly 01/12/2018   CHF (congestive heart failure) 06/08/2022   Chronic back pain 01/04/2015   Chronic constipation 04/25/2020   Chronic diastolic heart failure (HCC) 05/30/2015   Chronic pain syndrome 08/22/2019   back pain, sacroiliitis   Chronic  post-traumatic stress disorder (PTSD) 12/06/2020   Diabetic neuropathy (HCC) 02/06/2016   Dyslipidemia 09/24/2020   Essential hypertension    Functional diarrhea 10/26/2020   Herpes genitalis in women 07/16/2015   History of adenomatous polyp of colon 05/21/2019   Overview:   03/31/17: Colonoscopy: nonadvanced adenoma, microscopic colitis, f/u 5 yrs, [facility]/GAP   History of radiation therapy    Endometrial- 02/18/23-03/31/23-Dr. Antony Blackbird   Insomnia 01/23/2015   Migraine headache with aura 02/12/2016   Myofascial pain dysfunction syndrome 08/22/2019   Non-alcoholic fatty liver disease 01/12/2018   OSA (obstructive sleep apnea) 02/24/2019   10/09/2018 - HST  - AHI 40.6    Osteopenia 12/06/2020   Rx alendronate 35 mg.   Pinguecula 12/06/2020   Presbyopia 12/06/2020   Pulmonary hypertension    RLS (restless legs syndrome) 04/27/2015   Tremor, essential 12/11/2021   Type II diabetes mellitus (HCC) 09/24/2020     Past Surgical History:  Procedure Laterality Date   BREAST REDUCTION SURGERY     EYE SURGERY Right    cateracts   HAMMER TOE SURGERY     LEFT HEART CATH AND CORONARY ANGIOGRAPHY N/A 02/03/2018   Procedure: LEFT HEART CATH AND CORONARY ANGIOGRAPHY;  Surgeon: [name], Peter M, MD;  Location: [facility] INVASIVE CV LAB;  Service: Cardiovascular;  Laterality: N/A;   REVERSE SHOULDER ARTHROPLASTY Right 08/19/2019   Procedure: REVERSE SHOULDER ARTHROPLASTY;  Surgeon: Beverely Low, MD;  Location: [facility] ORS;  Service: Orthopedics;  Laterality: Right;  interscalene block   SHOULDER SURGERY Right    "I BROKE MY SHOUDLER   THIGH SURGERY     "TO REMOVE A TUMOR "     PREVIOUS MEDICATIONS:   CURRENT MEDICATIONS:  Outpatient Encounter Medications as of 09/02/2023  Medication Sig   albuterol (VENTOLIN HFA) 108 (90 Base) MCG/ACT inhaler Inhale 2 puffs into the lungs every 4 (four) hours as needed for wheezing or shortness of breath.   allopurinol (ZYLOPRIM) 100 MG tablet Take 100 mg by mouth  daily.   apixaban (ELIQUIS) 5 MG TABS tablet Take 1 tablet (5 mg total) by mouth 2 (two) times daily.   ARIPiprazole (ABILIFY) 2 MG tablet Take 2 mg by mouth at bedtime.   clotrimazole (MYCELEX) 10 MG troche Take 10 mg by mouth in the morning and at bedtime.   clotrimazole (QC CLOTRIMAZOLE) 1 % vaginal cream Place 1 Applicatorful vaginally at bedtime.   Continuous Glucose Sensor (DEXCOM G7 SENSOR) MISC CHANGE SENSOR EVERY 10 DAYS   cyanocobalamin (VITAMIN B12) 1000 MCG tablet Take 1 tablet (1,000 mcg total) by mouth daily.   denosumab (PROLIA) 60 MG/ML SOSY injection Inject 60 mg into the skin every 6 (six) months.   donepezil (ARICEPT) 5 MG tablet  Take 1 tablet (5 mg total) by mouth every morning.   doxepin (SINEQUAN) 10 MG capsule Take 10 mg by mouth at bedtime.   escitalopram (LEXAPRO) 10 MG tablet Take 10 mg by mouth daily.   fluticasone-salmeterol (ADVAIR DISKUS) 100-50 MCG/ACT AEPB Inhale 1 puff into the lungs 2 (two) times daily. (Patient taking differently: Inhale 1 puff into the lungs 2 (two) times daily as needed (for flares).)   hydrOXYzine (VISTARIL) 25 MG capsule Take 25 mg by mouth 3 (three) times daily.   Insulin Disposable Pump (OMNIPOD 5 G7 PODS, GEN 5,) MISC 1 Device by Does not apply route every other day.   insulin glargine (LANTUS SOLOSTAR) 100 UNIT/ML Solostar Pen Inject 32 Units into the skin 2 (two) times daily.   insulin lispro (HUMALOG KWIKPEN) 100 UNIT/ML KwikPen Max daily 45 units (Patient taking differently: Inject 6-45 Units into the skin 3 (three) times daily. Max daily 45 units)   Insulin Pen Needle 29G X MISC 1 Device by Does not apply route daily in the afternoon.   levocetirizine (XYZAL ALLERGY 24HR) 5 MG tablet Take 1 tablet (5 mg total) by mouth every evening. For itch   lidocaine (XYLOCAINE) 2 % solution Use as directed 15 mLs in the mouth or throat every 4 (four) hours as needed for mouth pain.   LORazepam (ATIVAN) 0.5 MG tablet Take 0.5-1 mg by mouth  See admin instructions. Take 0.5 mg by mouth in the morning and 1 mg at bedtime   losartan (COZAAR) 25 MG tablet Take 25 mg by mouth daily.   MAGNESIUM PO Take 1 tablet by mouth daily.   meclizine (ANTIVERT) 25 MG tablet Take 1 tablet (25 mg total) by mouth 3 (three) times daily as needed for dizziness.   metFORMIN (GLUCOPHAGE-XR) 500 MG 24 hr tablet Take 2 tablets (1,000 mg total) by mouth daily with breakfast.   Methocarbamol 1000 MG TABS Take 1,000 mg by mouth every 6 (six) hours as needed.   metoprolol tartrate (LOPRESSOR) 50 MG tablet Take 1 tablet (50 mg total) by mouth 2 (two) times daily.   naloxone (NARCAN) nasal spray 4 mg/0.1 mL Place 1 spray into the nose daily as needed (for over dose).   nystatin (MYCOSTATIN/NYSTOP) powder Apply 1 Application topically as needed (rash).   nystatin ointment (MYCOSTATIN) Apply 1 Application topically 2 (two) times daily as needed (for rashes).   omeprazole (PRILOSEC) 40 MG capsule Take 40 mg by mouth daily.   ondansetron (ZOFRAN) 4 MG tablet Take 1 tablet (4 mg total) by mouth every 8 (eight) hours as needed for nausea or vomiting. For cancer related nausea   OXcarbazepine (TRILEPTAL) 150 MG tablet Take 1 tablet (150 mg total) by mouth 2 (two) times daily.   oxyCODONE (OXYCONTIN) 20 mg 12 hr tablet Take 1 tablet (20 mg total) by mouth every 12 (twelve) hours. Refill 4/7   Oxycodone HCl 20 MG TABS Take 1 tablet (20 mg total) by mouth 2 (two) times daily as needed. Do Not Fill Before 10/05/23   pregabalin (LYRICA) 200 MG capsule Take 200 mg by mouth 2 (two) times daily.   promethazine (PHENERGAN) 25 MG tablet Take 0.5-1 tablets (12.5-25 mg total) by mouth every 6 (six) hours as needed for refractory nausea / vomiting (cancer related nausea/vomiting).   rOPINIRole (REQUIP) 1 MG tablet Take 1 tablet (1 mg total) by mouth at bedtime. For leg cramps   senna-docusate (SENOKOT-S) 8.6-50 MG tablet Take 1 tablet by mouth at bedtime.  Suvorexant (BELSOMRA) 20 MG  TABS Take 1 tablet (20 mg total) by mouth at bedtime as needed. (Patient taking differently: Take 20 mg by mouth at bedtime as needed (for sleep).)   tirzepatide (MOUNJARO) 10 MG/0.5ML Pen Inject 10 mg into the skin once a week.   TYLENOL 500 MG tablet Take 500-1,000 mg by mouth every 6 (six) hours as needed for mild pain (pain score 1-3) or headache.   Vitamin D, Cholecalciferol, 25 MCG (1000 UT) TABS Take 1,000 Units by mouth daily in the afternoon.   atorvastatin (LIPITOR) 80 MG tablet Take 1 tablet (80 mg total) by mouth daily. (Patient taking differently: Take 80 mg by mouth every evening.)   potassium chloride (KLOR-CON) 10 MEQ tablet Take 1 tablet (10 mEq total) by mouth daily.   spironolactone (ALDACTONE) 25 MG tablet Take 0.5 tablets (12.5 mg total) by mouth daily.   Facility-Administered Encounter Medications as of 09/02/2023  Medication   bupivacaine-epinephrine (MARCAINE W/ EPI) 0.5% -1:200000 injection     Objective:     PHYSICAL EXAMINATION:    VITALS:   Vitals:   09/02/23 1121  BP: 126/71  Pulse: 78  Resp: 20  SpO2: 98%  Weight: 186 lb (84.4 kg)  Height: 5\' 2"  (1.575 m)    GEN:  The patient appears stated age and is in NAD. HEENT:  Normocephalic, atraumatic.   Neurological examination:  General: NAD, well-groomed, appears stated age. Orientation: The patient is alert. Oriented to person, place and date Cranial nerves: There is good facial symmetry.The speech is fluent and clear. No aphasia or dysarthria. Fund of knowledge is appropriate. Recent memory impaired and remote memory is normal.  Attention and concentration are normal.  Able to name objects and repeat phrases.  Hearing is intact to conversational tone  .   Delayed recall 3/3 Sensation: Sensation is intact to light touch throughout Motor: Strength is at least antigravity x4. DTR's 2/4 in UE/LE      01/26/2019   11:00 AM 08/06/2018   11:00 AM  Montreal Cognitive Assessment   Visuospatial/ Executive  (0/5) 2 4  Naming (0/3) 3 3  Attention: Read list of digits (0/2) 1 2  Attention: Read list of letters (0/1) 0 1  Attention: Serial 7 subtraction starting at 100 (0/3) 0 1  Language: Repeat phrase (0/2) 1 1  Language : Fluency (0/1) 0 0  Abstraction (0/2) 1 1  Delayed Recall (0/5) 0 4  Orientation (0/6) 3 6  Total 11 23       09/02/2023   12:00 PM 11/08/2021    7:00 AM 08/28/2020   11:00 AM  MMSE - Mini Mental State Exam  Orientation to time 5 5 4   Orientation to Place 5 5 5   Registration 3 3 3   Attention/ Calculation 5 3 1   Recall 3 3 3   Language- name 2 objects 2 2 2   Language- repeat 1 1 1   Language- follow 3 step command 3 3 3   Language- read & follow direction 1 1 1   Write a sentence 1 1 1   Copy design 1 1 1   Total score 30 28 25        Movement examination: Tone: There is normal tone in the UE/LE Abnormal movements:  no tremor.  No myoclonus.  No asterixis.   Coordination:  There is no decremation with RAM's. Normal finger to nose  Gait and Station: The patient has no difficulty arising out of a deep-seated chair without the use of the hands. The  patient's stride length is good.  Gait is cautious and narrow.   Thank you for allowing [place] the opportunity to participate in the care of this nice patient. Please do not hesitate to contact us for any questions or concerns.   Total time spent on today's visit was 31 minutes dedicated to this patient today, preparing to see patient, examining the patient, ordering tests and/or medications and counseling the patient, documenting clinical information in the EHR or other health record, independently interpreting results and communicating results to the patient/family, discussing treatment and goals, answering patient's questions and coordinating care.  Cc:  Mechele Claude, MD  Marlowe Kays 09/02/2023 12:43 PM

## 2023-09-02 NOTE — Patient Instructions (Addendum)
  Follow up in 6 months Tue Oct 14  at 11:30  Start Donepezil 5mg   daily. Side effects discussed   Continue to control mood as per PCP and Psychiatry, monitor polypharmacy Recommend close follow up with Pulmonary for increased sleepiness during the day

## 2023-09-02 NOTE — Telephone Encounter (Signed)
CALLED PATIENT, NO ANSWER, LEFT MESSAGE TO RETURN CALL 

## 2023-09-03 ENCOUNTER — Other Ambulatory Visit: Payer: Self-pay

## 2023-09-03 ENCOUNTER — Telehealth: Payer: Self-pay | Admitting: *Deleted

## 2023-09-03 ENCOUNTER — Emergency Department (HOSPITAL_COMMUNITY)
Admission: EM | Admit: 2023-09-03 | Discharge: 2023-09-03 | Disposition: A | Discharge status: Home / Self Care | Attending: Emergency Medicine | Admitting: Emergency Medicine

## 2023-09-03 ENCOUNTER — Emergency Department (HOSPITAL_COMMUNITY)

## 2023-09-03 DIAGNOSIS — K625 Hemorrhage of anus and rectum: Secondary | ICD-10-CM | POA: Diagnosis not present

## 2023-09-03 DIAGNOSIS — K5730 Diverticulosis of large intestine without perforation or abscess without bleeding: Secondary | ICD-10-CM | POA: Diagnosis not present

## 2023-09-03 DIAGNOSIS — Z794 Long term (current) use of insulin: Secondary | ICD-10-CM | POA: Insufficient documentation

## 2023-09-03 DIAGNOSIS — Z79899 Other long term (current) drug therapy: Secondary | ICD-10-CM | POA: Insufficient documentation

## 2023-09-03 DIAGNOSIS — Z7901 Long term (current) use of anticoagulants: Secondary | ICD-10-CM | POA: Insufficient documentation

## 2023-09-03 DIAGNOSIS — I2510 Atherosclerotic heart disease of native coronary artery without angina pectoris: Secondary | ICD-10-CM | POA: Insufficient documentation

## 2023-09-03 DIAGNOSIS — I509 Heart failure, unspecified: Secondary | ICD-10-CM | POA: Diagnosis not present

## 2023-09-03 DIAGNOSIS — R1084 Generalized abdominal pain: Secondary | ICD-10-CM | POA: Diagnosis not present

## 2023-09-03 DIAGNOSIS — I110 Hypertensive heart disease with heart failure: Secondary | ICD-10-CM | POA: Diagnosis not present

## 2023-09-03 DIAGNOSIS — Z91040 Latex allergy status: Secondary | ICD-10-CM | POA: Diagnosis not present

## 2023-09-03 DIAGNOSIS — E1140 Type 2 diabetes mellitus with diabetic neuropathy, unspecified: Secondary | ICD-10-CM | POA: Insufficient documentation

## 2023-09-03 DIAGNOSIS — E871 Hypo-osmolality and hyponatremia: Secondary | ICD-10-CM | POA: Insufficient documentation

## 2023-09-03 DIAGNOSIS — K4090 Unilateral inguinal hernia, without obstruction or gangrene, not specified as recurrent: Secondary | ICD-10-CM | POA: Diagnosis not present

## 2023-09-03 DIAGNOSIS — R58 Hemorrhage, not elsewhere classified: Secondary | ICD-10-CM | POA: Diagnosis not present

## 2023-09-03 DIAGNOSIS — Z7984 Long term (current) use of oral hypoglycemic drugs: Secondary | ICD-10-CM | POA: Insufficient documentation

## 2023-09-03 DIAGNOSIS — I10 Essential (primary) hypertension: Secondary | ICD-10-CM | POA: Diagnosis not present

## 2023-09-03 LAB — COMPREHENSIVE METABOLIC PANEL WITH GFR
ALT: 15 U/L (ref 0–44)
AST: 19 U/L (ref 15–41)
Albumin: 3.7 g/dL (ref 3.5–5.0)
Alkaline Phosphatase: 76 U/L (ref 38–126)
Anion gap: 10 (ref 5–15)
BUN: 12 mg/dL (ref 8–23)
CO2: 26 mmol/L (ref 22–32)
Calcium: 9.1 mg/dL (ref 8.9–10.3)
Chloride: 95 mmol/L — ABNORMAL LOW (ref 98–111)
Creatinine, Ser: 0.76 mg/dL (ref 0.44–1.00)
GFR, Estimated: >60 mL/min (ref >60)
Glucose, Bld: 85 mg/dL (ref 70–99)
Potassium: 3.8 mmol/L (ref 3.5–5.1)
Sodium: 131 mmol/L — ABNORMAL LOW (ref 135–145)
Total Bilirubin: 0.6 mg/dL (ref 0.0–1.2)
Total Protein: 7.4 g/dL (ref 6.5–8.1)

## 2023-09-03 LAB — TYPE AND SCREEN
ABO/RH(D): O POS
Antibody Screen: NEGATIVE

## 2023-09-03 LAB — CBC WITH DIFFERENTIAL/PLATELET
Abs Immature Granulocytes: 0.12 10*3/uL — ABNORMAL HIGH (ref 0.00–0.07)
Basophils Absolute: 0 10*3/uL (ref 0.0–0.1)
Basophils Relative: 0 %
Eosinophils Absolute: 0.1 10*3/uL (ref 0.0–0.5)
Eosinophils Relative: 1 %
HCT: 34.4 % — ABNORMAL LOW (ref 36.0–46.0)
Hemoglobin: 11.2 g/dL — ABNORMAL LOW (ref 12.0–15.0)
Immature Granulocytes: 2 %
Lymphocytes Relative: 10 %
Lymphs Abs: 0.8 10*3/uL (ref 0.7–4.0)
MCH: 29.9 pg (ref 26.0–34.0)
MCHC: 32.6 g/dL (ref 30.0–36.0)
MCV: 91.7 fL (ref 80.0–100.0)
Monocytes Absolute: 0.6 10*3/uL (ref 0.1–1.0)
Monocytes Relative: 8 %
Neutro Abs: 6.4 10*3/uL (ref 1.7–7.7)
Neutrophils Relative %: 79 %
Platelets: 219 10*3/uL (ref 150–400)
RBC: 3.75 MIL/uL — ABNORMAL LOW (ref 3.87–5.11)
RDW: 13.8 % (ref 11.5–15.5)
WBC: 8.1 10*3/uL (ref 4.0–10.5)
nRBC: 0 % (ref 0.0–0.2)

## 2023-09-03 LAB — PROTIME-INR
INR: 1.2 (ref 0.8–1.2)
Prothrombin Time: 15.1 s (ref 11.4–15.2)

## 2023-09-03 LAB — POC OCCULT BLOOD, ED: Fecal Occult Bld: POSITIVE — AB

## 2023-09-03 MED ORDER — MORPHINE SULFATE (PF) 4 MG/ML IV SOLN
4.0000 mg | Freq: Once | INTRAVENOUS | Status: AC
  Administered 2023-09-03: 4 mg via INTRAVENOUS
  Filled 2023-09-03: qty 1

## 2023-09-03 MED ORDER — MORPHINE SULFATE (PF) 2 MG/ML IV SOLN
2.0000 mg | Freq: Once | INTRAVENOUS | Status: AC
  Administered 2023-09-03: 2 mg via INTRAVENOUS
  Filled 2023-09-03: qty 1

## 2023-09-03 NOTE — ED Provider Notes (Signed)
 [place] EMERGENCY DEPARTMENT AT [facility] Provider Note   CSN: [id number] Arrival date & time: 09/03/23  1549     History  No chief complaint on file.   Amber Stephenson is a 78 y.o. female.  HPI Patient is a 78 year old female presents the ED today with complaints of 2 episodes of rectal bleeding which she noticed when wiping having both clots and red blood on toilet paper.  He has had a history of A fib on Eliquis,  HTN, CHF, diabetic neuropathy, bipolar 1, CAD, type 2 diabetes, renal insufficiency, pulmonary hypertension, migraine headaches, nonalcoholic fatty liver disease, cardiomegaly.  Noted to be on oxygen at baseline with no need to increase.  States that she also has been having increased right-sided low back pain particularly over the PSIS which is chronic for her.  Also saying that she is having generalized abdominal tenderness which does not localize.  States she also has been ill with a chronic UTI and has been treated with multiple antibiotics at this time, including Macrobid.  With urine cultures also done.  Denies fever, headache, vision changes, cough, worsening shortness of breath, chest pain, nausea, vomiting, leg swelling.      Home Medications Prior to Admission medications   Medication Sig Start Date End Date Taking? Authorizing Provider  albuterol (VENTOLIN HFA) 108 (90 Base) MCG/ACT inhaler Inhale 2 puffs into the lungs every 4 (four) hours as needed for wheezing or shortness of breath. 06/15/23   Mechele Claude, MD  allopurinol (ZYLOPRIM) 100 MG tablet Take 100 mg by mouth daily. 10/22/22   [provider]  apixaban (ELIQUIS) 5 MG TABS tablet Take 1 tablet (5 mg total) by mouth 2 (two) times daily. 09/09/22   Quintella Reichert, MD  ARIPiprazole (ABILIFY) 2 MG tablet Take 2 mg by mouth at bedtime. 10/07/21   [provider]  atorvastatin (LIPITOR) 80 MG tablet Take 1 tablet (80 mg total) by mouth daily. Patient taking differently: Take  80 mg by mouth every evening. 02/05/23 08/19/23  Sharlene Dory, PA-C  clotrimazole (MYCELEX) 10 MG troche Take 10 mg by mouth in the morning and at bedtime. 05/28/23   [provider]  clotrimazole (QC CLOTRIMAZOLE) 1 % vaginal cream Place 1 Applicatorful vaginally at bedtime. 09/02/23   St Vena Austria, NP  Continuous Glucose Sensor (DEXCOM G7 SENSOR) MISC CHANGE SENSOR EVERY 10 DAYS 07/20/23   Shamleffer, Konrad Dolores, MD  cyanocobalamin (VITAMIN B12) 1000 MCG tablet Take 1 tablet (1,000 mcg total) by mouth daily. 09/04/22   Ghimire, Werner Lean, MD  denosumab (PROLIA) 60 MG/ML SOSY injection Inject 60 mg into the skin every 6 (six) months. 08/14/20   Mechele Claude, MD  donepezil (ARICEPT) 5 MG tablet Take 1 tablet (5 mg total) by mouth every morning. 09/02/23   Marcos Eke, PA-C  doxepin (SINEQUAN) 10 MG capsule Take 10 mg by mouth at bedtime. 11/27/22   [provider]  escitalopram (LEXAPRO) 10 MG tablet Take 10 mg by mouth daily.    [provider]  fluticasone-salmeterol (ADVAIR DISKUS) 100-50 MCG/ACT AEPB Inhale 1 puff into the lungs 2 (two) times daily. Patient taking differently: Inhale 1 puff into the lungs 2 (two) times daily as needed (for flares). 09/11/22   Parrett, Virgel Bouquet, NP  hydrOXYzine (VISTARIL) 25 MG capsule Take 25 mg by mouth 3 (three) times daily. 07/08/23   [provider]  Insulin Disposable Pump (OMNIPOD 5 G7 PODS, GEN 5,) MISC 1 Device  by Does not apply route every other day. 02/12/23   Shamleffer, Konrad Dolores, MD  insulin glargine (LANTUS SOLOSTAR) 100 UNIT/ML Solostar Pen Inject 32 Units into the skin 2 (two) times daily. 05/22/23   Shamleffer, Konrad Dolores, MD  insulin lispro (HUMALOG KWIKPEN) 100 UNIT/ML KwikPen Max daily 45 units Patient taking differently: Inject 6-45 Units into the skin 3 (three) times daily. Max daily 45 units 05/22/23   Shamleffer, Konrad Dolores, MD  Insulin Pen Needle 29G X MISC 1 Device by Does  not apply route daily in the afternoon. 05/22/23   Shamleffer, Konrad Dolores, MD  levocetirizine (XYZAL ALLERGY 24HR) 5 MG tablet Take 1 tablet (5 mg total) by mouth every evening. For itch 05/12/23   Mechele Claude, MD  lidocaine (XYLOCAINE) 2 % solution Use as directed 15 mLs in the mouth or throat every 4 (four) hours as needed for mouth pain. 05/18/23   Mechele Claude, MD  LORazepam (ATIVAN) 0.5 MG tablet Take 0.5-1 mg by mouth See admin instructions. Take 0.5 mg by mouth in the morning and 1 mg at bedtime    [provider]  losartan (COZAAR) 25 MG tablet Take 25 mg by mouth daily. 02/14/23   [provider]  MAGNESIUM PO Take 1 tablet by mouth daily.    [provider]  meclizine (ANTIVERT) 25 MG tablet Take 1 tablet (25 mg total) by mouth 3 (three) times daily as needed for dizziness. 06/05/23   Dettinger, Elige Radon, MD  metFORMIN (GLUCOPHAGE-XR) 500 MG 24 hr tablet Take 2 tablets (1,000 mg total) by mouth daily with breakfast. 05/22/23   Shamleffer, Konrad Dolores, MD  Methocarbamol 1000 MG TABS Take 1,000 mg by mouth every 6 (six) hours as needed. 08/31/23   Lovorn, Aundra Millet, MD  metoprolol tartrate (LOPRESSOR) 50 MG tablet Take 1 tablet (50 mg total) by mouth 2 (two) times daily. 01/06/23   Quintella Reichert, MD  naloxone [facility]) nasal spray 4 mg/0.1 mL Place 1 spray into the nose daily as needed (for over dose). 08/08/21   [provider]  nystatin (MYCOSTATIN/NYSTOP) powder Apply 1 Application topically as needed (rash). 06/16/22   Shamleffer, Konrad Dolores, MD  nystatin ointment (MYCOSTATIN) Apply 1 Application topically 2 (two) times daily as needed (for rashes). 04/06/23   Adline Potter, NP  omeprazole (PRILOSEC) 40 MG capsule Take 40 mg by mouth daily.    [provider]  ondansetron (ZOFRAN) 4 MG tablet Take 1 tablet (4 mg total) by mouth every 8 (eight) hours as needed for nausea or vomiting. For cancer related nausea 02/16/23   Lovorn, Aundra Millet,  MD  OXcarbazepine (TRILEPTAL) 150 MG tablet Take 1 tablet (150 mg total) by mouth 2 (two) times daily. 06/13/22   Marguerita Merles Latif, DO  oxyCODONE (OXYCONTIN) 20 mg 12 hr tablet Take 1 tablet (20 mg total) by mouth every 12 (twelve) hours. Refill 4/7 08/31/23   Lovorn, Aundra Millet, MD  Oxycodone HCl 20 MG TABS Take 1 tablet (20 mg total) by mouth 2 (two) times daily as needed. Do Not Fill Before 10/05/23 08/31/23   Lovorn, Aundra Millet, MD  potassium chloride (KLOR-CON) 10 MEQ tablet Take 1 tablet (10 mEq total) by mouth daily. 02/05/23 08/19/23  Sharlene Dory, PA-C  pregabalin (LYRICA) 200 MG capsule Take 200 mg by mouth 2 (two) times daily.    [provider]  promethazine (PHENERGAN) 25 MG tablet Take 0.5-1 tablets (12.5-25 mg total) by mouth every 6 (six) hours as needed for refractory  nausea / vomiting (cancer related nausea/vomiting). 02/16/23   Lovorn, Aundra Millet, MD  rOPINIRole (REQUIP) 1 MG tablet Take 1 tablet (1 mg total) by mouth at bedtime. For leg cramps 05/12/23   Mechele Claude, MD  senna-docusate (SENOKOT-S) 8.6-50 MG tablet Take 1 tablet by mouth at bedtime. 06/13/22   Marguerita Merles Latif, DO  spironolactone (ALDACTONE) 25 MG tablet Take 0.5 tablets (12.5 mg total) by mouth daily. 01/31/23 06/11/23  Narda Bonds, MD  Suvorexant (BELSOMRA) 20 MG TABS Take 1 tablet (20 mg total) by mouth at bedtime as needed. Patient taking differently: Take 20 mg by mouth at bedtime as needed (for sleep). 08/13/23   Mechele Claude, MD  tirzepatide [facility]) 10 MG/0.5ML Pen Inject 10 mg into the skin once a week. 06/29/23   Shamleffer, Konrad Dolores, MD  TYLENOL 500 MG tablet Take 500-1,000 mg by mouth every 6 (six) hours as needed for mild pain (pain score 1-3) or headache.    [provider]  Vitamin D, Cholecalciferol, 25 MCG (1000 UT) TABS Take 1,000 Units by mouth daily in the afternoon. 07/26/20   [provider]      Allergies    Iodine, Ivp dye [iodinated contrast media], Latex, Tape, and  Tizanidine    Review of Systems   Review of Systems  Gastrointestinal:  Positive for abdominal pain and anal bleeding.  All other systems reviewed and are negative.   Physical Exam Updated Vital Signs BP (!) 150/84   Pulse 77   Temp 97.8 F (36.6 C) (Oral)   Resp 18   SpO2 100%  Physical Exam Vitals and nursing note reviewed. Exam conducted with a chaperone present.  Constitutional:      General: She is not in acute distress.    Appearance: Normal appearance. She is not ill-appearing.  HENT:     Head: Normocephalic and atraumatic.  Eyes:     Extraocular Movements: Extraocular movements intact.     Conjunctiva/sclera: Conjunctivae normal.  Cardiovascular:     Rate and Rhythm: Normal rate and regular rhythm.     Pulses: Normal pulses.     Heart sounds: Normal heart sounds. No murmur heard.    No friction rub. No gallop.  Pulmonary:     Effort: Pulmonary effort is normal. No respiratory distress.     Breath sounds: Normal breath sounds.  Abdominal:     General: Abdomen is flat. There is no distension.     Palpations: Abdomen is soft.     Tenderness: There is abdominal tenderness. There is no right CVA tenderness, left CVA tenderness or guarding.  Genitourinary:    General: Normal vulva.     Exam position: Knee-chest position.     Labia:        Right: No rash, tenderness, lesion or injury.        Left: No rash, tenderness, lesion or injury.      Urethra: No prolapse or urethral pain.     Vagina: Normal.     Cervix: Normal. No cervical motion tenderness, discharge or erythema.     Rectum: Normal. No tenderness, anal fissure, external hemorrhoid or internal hemorrhoid. Normal anal tone.  Musculoskeletal:     Cervical back: No rigidity.  Skin:    General: Skin is warm and dry.     Findings: No bruising or erythema.  Neurological:     General: No focal deficit present.     Mental Status: She is alert. Mental status is at baseline.  Psychiatric:  Mood and Affect:  Mood normal.     ED Results / Procedures / Treatments   Labs (all labs ordered are listed, but only abnormal results are displayed) Labs Reviewed  CBC WITH DIFFERENTIAL/PLATELET - Abnormal; Notable for the following components:      Result Value   RBC 3.75 (*)    Hemoglobin 11.2 (*)    HCT 34.4 (*)    Abs Immature Granulocytes 0.12 (*)    All other components within normal limits  COMPREHENSIVE METABOLIC PANEL WITH GFR - Abnormal; Notable for the following components:   Sodium 131 (*)    Chloride 95 (*)    All other components within normal limits  POC OCCULT BLOOD, ED - Abnormal; Notable for the following components:   Fecal Occult Bld POSITIVE (*)    All other components within normal limits  PROTIME-INR  URINALYSIS, ROUTINE W REFLEX MICROSCOPIC  TYPE AND SCREEN    EKG None  Radiology CT Renal Stone Study Result Date: 09/03/2023 CLINICAL DATA:  Rectal bleeding. EXAM: CT ABDOMEN AND PELVIS WITHOUT CONTRAST TECHNIQUE: Multidetector CT imaging of the abdomen and pelvis was performed following the standard protocol without IV contrast. RADIATION DOSE REDUCTION: This exam was performed according to the departmental dose-optimization program which includes automated exposure control, adjustment of the mA and/or kV according to patient size and/or use of iterative reconstruction technique. COMPARISON:  June 26, 2023 FINDINGS: Lower chest: No acute abnormality. Hepatobiliary: No focal liver abnormality is seen. No gallstones, gallbladder wall thickening, or biliary dilatation. Pancreas: Unremarkable. No pancreatic ductal dilatation or surrounding inflammatory changes. Spleen: Normal in size without focal abnormality. Adrenals/Urinary Tract: Adrenal glands appear normal. Bilateral renal cortical scarring is noted. No hydronephrosis or renal obstruction is noted. Urinary bladder is unremarkable. Stomach/Bowel: Stomach is within normal limits. Appendix appears normal. No evidence of bowel  wall thickening, distention, or inflammatory changes. Sigmoid diverticulosis is noted without definite inflammation. Vascular/Lymphatic: Aortic atherosclerosis. No enlarged abdominal or pelvic lymph nodes. Reproductive: Uterus and bilateral adnexa are unremarkable. Other: Moderate size fat containing right inguinal hernia is noted. No ascites is noted. Musculoskeletal: No acute or significant osseous findings. IMPRESSION: Sigmoid diverticulosis without inflammation. Moderate size fat containing right inguinal hernia. No definite acute abnormality seen in the abdomen or pelvis. Aortic Atherosclerosis (ICD10-I70.0). Electronically Signed   By: Lupita Raider M.D.   On: 09/03/2023 19:53    Procedures Procedures    Medications Ordered in ED Medications  morphine (PF) 2 MG/ML injection 2 mg (2 mg Intravenous Given 09/03/23 1817)  morphine (PF) 4 MG/ML injection 4 mg (4 mg Intravenous Given 09/03/23 1924)    ED Course/ Medical Decision Making/ A&P                                 Medical Decision Making  This patient is a 78 year old female who presents to the ED for concern of rectal bleeding x 2 episodes today, noted on toilet paper and not in stool or toilet.   On physical exam, patient is in no acute distress, alert awake x 4, speaking in full sentences, not tachycardic, not tachypneic.  Noted to have abdominal pain to palpation generally but localized to right and left lower quadrants.  Patient also noted to have bilateral flank pain to palpation with specifically point tenderness over the PSIS.  This is noted be chronic.  On GU exam, no gross blood noted on rectal exam and vaginal canal  appeared normal, no cervical motion tenderness.   Labs showed anemia with hemoglobin 11.2 which seems to be at baseline a close to baseline.  Also noting mild hyponatremia 131 which patient says that she has previous already noted.  CT showed diverticulosis without diverticulitis and no other acute issues.  CMP was  otherwise unremarkable, POC occult blood was positive.  However no gross blood was noted.  I have low suspicion for any emergent pathologies present at this time that would require admission or further treatment at this time.  Will recommend that she follow-up with GI for the rectal bleed.  She does not wish to provide urine at this time as she says the culture was clear couple days prior.  White count was also normal.  I believe this patient is a to be discharged at this time.  Follow-up in outpatient setting.  Provided strict return to ED precautions.  Patient expressed agreement and understanding of plan.  All Questions answered.  Attending was also consulted and agreed with plan.  Differential diagnoses prior to evaluation: The emergent differential diagnosis includes, but is not limited to,  Diverticulitis, IBS, ovarian torsion, ovarian cyst, metastatic disease, appendicitis, colitis, mesenteric ischemia, AAA, enteritis, gastritis. This is not an exhaustive differential.   Past Medical History / Co-morbidities / Social History: A fib on Eliquis,  HTN, CHF, diabetic neuropathy, bipolar 1, CAD, type 2 diabetes, renal insufficiency, pulmonary hypertension, migraine headaches, nonalcoholic fatty liver disease, cardiomegaly  Additional history: Chart reviewed. Pertinent results include:  Seen yesterday by neurology for mild cognitive impairment noted to be on oxygen at night and as needed during the day.  Last seen on 08/27/2023 by cardiology  Last echo on 11/2020 noted to have an EF of 65 to 70%, 2D echo on 01/2023 with normal LVEF  Lab Tests/Imaging studies: I personally interpreted labs/imaging and the pertinent results include:    CBC is noted to have anemia with a hemoglobin of 11.2, near baseline. CMP notes a mild hyponatremia of 131 but otherwise unremarkable POC occult blood positive PT/INR unremarkable POC occult blood positive CT scan shows diverticulosis without diverticulitis as well  as moderate size fat-containing right inguinal hernia, no acute abnormalities, atherosclerosis I agree with the radiologist interpretation.  Medications: I ordered medication including morphine.  I have reviewed the patients home medicines and have made adjustments as needed.  Critical Interventions:  Social Determinants of Health:  Patient is a follow-up with PCP on Monday.  Disposition: After consideration of the diagnostic results and the patients response to treatment, I feel that the patient would benefit from discharge treatment as above.   emergency department workup does not suggest an emergent condition requiring admission or immediate intervention beyond what has been performed at this time. The plan is: Follow-up with PCP, follow-up with GI, continue to monitor symptoms at home, return for any new or worsening symptoms. The patient is safe for discharge and has been instructed to return immediately for worsening symptoms, change in symptoms or any other concerns.  Final Clinical Impression(s) / ED Diagnoses Final diagnoses:  Rectal bleeding    Rx / DC Orders ED Discharge Orders     None         Lavonia Drafts 09/03/23 2012    Cathren Laine, MD 09/03/23 [phone]

## 2023-09-03 NOTE — Patient Outreach (Signed)
 Care Management  Transitions of Care Program Transitions of Care Post-discharge week 2-3rd attempt  09/03/2023 Name: Amber Stephenson MRN: [id number] DOB: March 24, 1946  Subjective: Amber Stephenson is a 78 y.o. year old female who is a primary care patient of Stacks, Broadus John, MD. The Care Management team was unable to reach the patient by phone to assess and address transitions of care needs.   Plan: No further outreach attempts will be made at this time.  We have been unable to reach the patient.  Irving Shows [facility], BSN RN Care Manager/ Transition of Care [place]/ [facility] [phone]

## 2023-09-03 NOTE — ED Triage Notes (Signed)
 Pt BIB EMS for rectal bleeding, states "when I wiped there was bright red blood on the toilet paper with black tarry clots" reports being constipated and taking laxative yesterday and today when she got results from laxative. Hx uterine CA-in remission since November. C/o "menstrual cramp" like cramps and chronic back pain that is worsened

## 2023-09-03 NOTE — Telephone Encounter (Signed)
 Pt r/c.

## 2023-09-03 NOTE — Telephone Encounter (Signed)
 Called to inform patient that clotrimazole was sent into pharmacy. Patient aware, verbalized understanding. [facility] 09/03/23

## 2023-09-03 NOTE — Discharge Instructions (Signed)
 You are seen today for rectal bleeding.  You were noted to have some mild blood on digital exam however your CT and labs were reassuring that I have low suspicion for any emergent process requiring hospitalization or further treatment at this time.  Recommend that you continue follow-up with the GI doctor, calling their office to schedule appointment.  As well as follow-up with PCP on Monday.  Return for any new or worsening symptoms including fatigue, worsening shortness of breath, chest pain, worse bleeding, vision changes, confusion.

## 2023-09-04 ENCOUNTER — Telehealth: Payer: Self-pay

## 2023-09-04 DIAGNOSIS — E1129 Type 2 diabetes mellitus with other diabetic kidney complication: Secondary | ICD-10-CM | POA: Diagnosis not present

## 2023-09-04 DIAGNOSIS — E1122 Type 2 diabetes mellitus with diabetic chronic kidney disease: Secondary | ICD-10-CM | POA: Diagnosis not present

## 2023-09-04 DIAGNOSIS — N182 Chronic kidney disease, stage 2 (mild): Secondary | ICD-10-CM | POA: Diagnosis not present

## 2023-09-04 DIAGNOSIS — R809 Proteinuria, unspecified: Secondary | ICD-10-CM | POA: Diagnosis not present

## 2023-09-04 NOTE — Transitions of Care (Post Inpatient/ED Visit) (Signed)
 09/04/2023  Name: Amber Stephenson MRN: [id number] DOB: [birth year]  Today's TOC FU Call Status: Today's TOC FU Call Status:: Successful TOC FU Call Completed TOC FU Call Complete Date: 09/04/23 Patient's Name and Date of Birth confirmed.  Transition Care Management Follow-up Telephone Call Date of Discharge: 09/03/23 Discharge Facility: [facility]) Type of Discharge: Emergency Department Reason for ED Visit: Other: (Rectal bleeding) How have you been since you were released from the hospital?: Better Any questions or concerns?: No  Items Reviewed: Did you receive and understand the discharge instructions provided?: Yes Medications obtained,verified, and reconciled?: Yes (Medications Reviewed) Any new allergies since your discharge?: No Dietary orders reviewed?: NA Do you have support at home?: Yes People in Home: spouse  Medications Reviewed Today: Medications Reviewed Today     Reviewed by Anthoney Harada, LPN (Licensed Practical Nurse) on 09/04/23 at 0932  Med List Status: <None>   Medication Order Taking? Sig Documenting Provider Last Dose Status Informant  albuterol (VENTOLIN HFA) 108 (90 Base) MCG/ACT inhaler [id number] Yes Inhale 2 puffs into the lungs every 4 (four) hours as needed for wheezing or shortness of breath. Mechele Claude, MD Taking Active Spouse/Significant Other  allopurinol (ZYLOPRIM) 100 MG tablet [id number] Yes Take 100 mg by mouth daily. [provider] Taking Active Spouse/Significant Other  apixaban (ELIQUIS) 5 MG TABS tablet [id number] Yes Take 1 tablet (5 mg total) by mouth 2 (two) times daily. Quintella Reichert, MD Taking Active Spouse/Significant Other  ARIPiprazole (ABILIFY) 2 MG tablet [id number] Yes Take 2 mg by mouth at bedtime. [provider] Taking Active Spouse/Significant Other  atorvastatin (LIPITOR) 80 MG tablet [id number]  Take 1 tablet (80 mg total) by mouth daily.  Patient taking differently: Take 80 mg by mouth every  evening.   Sharlene Dory, [place]  Expired 08/19/23 2359 Spouse/Significant Other  clotrimazole (MYCELEX) 10 MG troche [id number] Yes Take 10 mg by mouth in the morning and at bedtime. [provider] Taking Active Spouse/Significant Other  clotrimazole (QC CLOTRIMAZOLE) 1 % vaginal cream [id number] Yes Place 1 Applicatorful vaginally at bedtime. [place] Lighter, Dois Davenport, NP Taking Active   Continuous Glucose Sensor (DEXCOM G7 SENSOR) [place] [id number] Yes CHANGE SENSOR EVERY 10 DAYS Shamleffer, Konrad Dolores, MD Taking Active Spouse/Significant Other  cyanocobalamin (VITAMIN B12) 1000 MCG tablet [id number] Yes Take 1 tablet (1,000 mcg total) by mouth daily. Ghimire, Werner Lean, MD Taking Active Spouse/Significant Other  denosumab (PROLIA) 60 MG/ML SOSY injection [id number] Yes Inject 60 mg into the skin every 6 (six) months. Mechele Claude, MD Taking Active Spouse/Significant Other  donepezil (ARICEPT) 5 MG tablet [id number] Yes Take 1 tablet (5 mg total) by mouth every morning. Marcos Eke, PA-C Taking Active   doxepin (SINEQUAN) 10 MG capsule [id number] Yes Take 10 mg by mouth at bedtime. [provider] Taking Active Spouse/Significant Other  escitalopram (LEXAPRO) 10 MG tablet [id number] Yes Take 10 mg by mouth daily. [provider] Taking Active Spouse/Significant Other  fluticasone-salmeterol (ADVAIR DISKUS) 100-50 MCG/ACT AEPB [id number] Yes Inhale 1 puff into the lungs 2 (two) times daily.  Patient taking differently: Inhale 1 puff into the lungs 2 (two) times daily as needed (for flares).   Julio Sicks, NP Taking Active Spouse/Significant Other           Med Note Antony Madura, Melody Comas Jan 28, 2023  8:18 PM)    hydrOXYzine (VISTARIL) 25 MG capsule [id number] Yes Take 25 mg by mouth 3 (three) times daily.  [provider] Taking Active Spouse/Significant Other  Insulin Disposable Pump (OMNIPOD 5 G7 PODS, GEN 5,) MISC [id number] Yes 1 Device by Does not apply  route every other day. Shamleffer, Konrad Dolores, MD Taking Active Spouse/Significant Other  insulin glargine (LANTUS SOLOSTAR) 100 UNIT/ML Solostar Pen [id number] Yes Inject 32 Units into the skin 2 (two) times daily. Shamleffer, Konrad Dolores, MD Taking Active Spouse/Significant Other  insulin lispro (HUMALOG KWIKPEN) 100 UNIT/ML KwikPen [id number] Yes Max daily 45 units  Patient taking differently: Inject 6-45 Units into the skin 3 (three) times daily. Max daily 45 units   Shamleffer, Konrad Dolores, MD Taking Active Spouse/Significant Other  Insulin Pen Needle 29G X MISC [id number] Yes 1 Device by Does not apply route daily in the afternoon. Shamleffer, Konrad Dolores, MD Taking Active Spouse/Significant Other  levocetirizine (XYZAL ALLERGY 24HR) 5 MG tablet [id number] Yes Take 1 tablet (5 mg total) by mouth every evening. For itch Mechele Claude, MD Taking Active Spouse/Significant Other  lidocaine (XYLOCAINE) 2 % solution [id number] Yes Use as directed 15 mLs in the mouth or throat every 4 (four) hours as needed for mouth pain. Mechele Claude, MD Taking Active Spouse/Significant Other  LORazepam (ATIVAN) 0.5 MG tablet [id number] Yes Take 0.5-1 mg by mouth See admin instructions. Take 0.5 mg by mouth in the morning and 1 mg at bedtime [provider] Taking Active Spouse/Significant Other  losartan (COZAAR) 25 MG tablet [id number] Yes Take 25 mg by mouth daily. [provider] Taking Active Spouse/Significant Other  MAGNESIUM PO [id number] Yes Take 1 tablet by mouth daily. [provider] Taking Active Spouse/Significant Other  meclizine (ANTIVERT) 25 MG tablet [id number] Yes Take 1 tablet (25 mg total) by mouth 3 (three) times daily as needed for dizziness. Dettinger, Elige Radon, MD Taking Active Spouse/Significant Other  metFORMIN (GLUCOPHAGE-XR) 500 MG 24 hr tablet [id number] Yes Take 2 tablets (1,000 mg total) by mouth daily with breakfast. Shamleffer, Konrad Dolores,  MD Taking Active Spouse/Significant Other  Methocarbamol 1000 MG TABS [id number] Yes Take 1,000 mg by mouth every 6 (six) hours as needed. Lovorn, Aundra Millet, MD Taking Active   metoprolol tartrate (LOPRESSOR) 50 MG tablet [id number] Yes Take 1 tablet (50 mg total) by mouth 2 (two) times daily. Quintella Reichert, MD Taking Active Spouse/Significant Other  naloxone [facility]) nasal spray 4 mg/0.1 mL [id number] Yes Place 1 spray into the nose daily as needed (for over dose). [provider] Taking Active Spouse/Significant Other           Med Note (CRUTHIS, CHLOE C   Wed Aug 19, 2023  1:55 PM)    nystatin (MYCOSTATIN/NYSTOP) powder [id number] Yes Apply 1 Application topically as needed (rash). Shamleffer, Konrad Dolores, MD Taking Active Spouse/Significant Other           Med Note (CRUTHIS, CHLOE C   Wed Aug 19, 2023  1:55 PM)    nystatin ointment (MYCOSTATIN) [id number] Yes Apply 1 Application topically 2 (two) times daily as needed (for rashes). Adline Potter, NP Taking Active Spouse/Significant Other           Med Note (CRUTHIS, CHLOE C   Wed Aug 19, 2023  1:55 PM)    omeprazole (PRILOSEC) 40 MG capsule [id number] Yes Take 40 mg by mouth daily. [provider] Taking Active Spouse/Significant Other  ondansetron (ZOFRAN) 4 MG tablet [id number] Yes Take 1 tablet (4 mg total) by mouth every 8 (eight) hours as needed for nausea or vomiting. For cancer related nausea Lovorn, Aundra Millet, MD  Taking Active Spouse/Significant Other  OXcarbazepine (TRILEPTAL) 150 MG tablet [id number] Yes Take 1 tablet (150 mg total) by mouth 2 (two) times daily. Marguerita Merles Ward, DO Taking Active Spouse/Significant Other  oxyCODONE (OXYCONTIN) 20 mg 12 hr tablet [id number] Yes Take 1 tablet (20 mg total) by mouth every 12 (twelve) hours. Refill 4/7 Lovorn, Aundra Millet, MD Taking Active   Oxycodone HCl 20 MG TABS [id number] Yes Take 1 tablet (20 mg total) by mouth 2 (two) times daily as needed. Do Not Fill Before 10/05/23 Genice Rouge, MD Taking Active   potassium chloride (KLOR-CON) 10 MEQ tablet [id number]  Take 1 tablet (10 mEq total) by mouth daily. Sharlene Dory, [place]  Expired 08/19/23 2359 Spouse/Significant Other  pregabalin (LYRICA) 200 MG capsule [id number] Yes Take 200 mg by mouth 2 (two) times daily. [provider] Taking Active Spouse/Significant Other           Med Note Antony Madura, Arn Medal   Wed Jan 28, 2023  8:22 PM)    promethazine (PHENERGAN) 25 MG tablet [id number] Yes Take 0.5-1 tablets (12.5-25 mg total) by mouth every 6 (six) hours as needed for refractory nausea / vomiting (cancer related nausea/vomiting). Lovorn, Aundra Millet, MD Taking Active Spouse/Significant Other  rOPINIRole (REQUIP) 1 MG tablet [id number] Yes Take 1 tablet (1 mg total) by mouth at bedtime. For leg cramps Mechele Claude, MD Taking Active Spouse/Significant Other  senna-docusate (SENOKOT-S) 8.6-50 MG tablet [id number] Yes Take 1 tablet by mouth at bedtime. Marguerita Merles Draper, DO Taking Active Spouse/Significant Other           Med Note (CRUTHIS, CHLOE C   Wed Aug 19, 2023  1:55 PM)    spironolactone (ALDACTONE) 25 MG tablet [id number]  Take 0.5 tablets (12.5 mg total) by mouth daily. Narda Bonds, MD  Expired 06/11/23 2359 Spouse/Significant Other, Pharmacy Records           Med Note (CRUTHIS, CHLOE C   Wed Aug 19, 2023  1:55 PM)    Suvorexant (BELSOMRA) 20 MG TABS [id number] Yes Take 1 tablet (20 mg total) by mouth at bedtime as needed.  Patient taking differently: Take 20 mg by mouth at bedtime as needed (for sleep).   Mechele Claude, MD Taking Active Spouse/Significant Other  tirzepatide [facility]) 10 MG/0.5ML Pen [id number] Yes Inject 10 mg into the skin once a week. Shamleffer, Konrad Dolores, MD Taking Active Spouse/Significant Other           Med Note (SATTERFIELD, Genoveva Ill   Wed Aug 19, 2023  5:04 PM) Take on Sundays  TYLENOL 500 MG tablet [id number] Yes Take 500-1,000 mg by mouth every 6 (six) hours as needed for mild pain  (pain score 1-3) or headache. [provider] Taking Active Spouse/Significant Other           Med Note (CRUTHIS, CHLOE C   Wed Aug 19, 2023  1:55 PM)    Vitamin D, Cholecalciferol, 25 MCG (1000 UT) TABS [id number] Yes Take 1,000 Units by mouth daily in the afternoon. [provider] Taking Active Spouse/Significant Other  Med List Note Benjamine Mola 08/29/22 1018): [employer] insurance             Home Care and Equipment/Supplies: Were Home Health Services Ordered?: NA Has Agency set up a time to come to your home?: No Any new equipment or medical supplies ordered?: NA  Functional Questionnaire: Do you need assistance with bathing/showering or dressing?: No Do you need assistance with meal preparation?: No  Do you need assistance with eating?: No Do you have difficulty maintaining continence: No Do you need assistance with getting out of bed/getting out of a chair/moving?: No Do you have difficulty managing or taking your medications?: No  Follow up appointments reviewed: PCP Follow-up appointment confirmed?: Yes Date of PCP follow-up appointment?: 09/07/23 Follow-up Provider: [name] Follow-up appointment confirmed?: No Follow-Up Specialty Provider:: GI Reason Specialist Follow-Up Not Confirmed: Patient has Specialist Provider Number and will Call for Appointment Do you need transportation to your follow-up appointment?: No Do you understand care options if your condition(s) worsen?: Yes-patient verbalized understanding    SIGNATURE Kandis Fantasia, LPN [facility] Health Advisor [place] [facility] You Are. We Are. One [facility] Direct Dial [phone]

## 2023-09-07 ENCOUNTER — Ambulatory Visit (INDEPENDENT_AMBULATORY_CARE_PROVIDER_SITE_OTHER): Admitting: Family Medicine

## 2023-09-07 VITALS — BP 122/73 | HR 83 | Temp 98.2°F | Ht 62.0 in | Wt 186.0 lb

## 2023-09-07 DIAGNOSIS — G2581 Restless legs syndrome: Secondary | ICD-10-CM

## 2023-09-07 DIAGNOSIS — R300 Dysuria: Secondary | ICD-10-CM | POA: Diagnosis not present

## 2023-09-07 DIAGNOSIS — K5909 Other constipation: Secondary | ICD-10-CM | POA: Diagnosis not present

## 2023-09-07 LAB — URINALYSIS, ROUTINE W REFLEX MICROSCOPIC
Bilirubin, UA: NEGATIVE
Glucose, UA: NEGATIVE
Ketones, UA: NEGATIVE
Nitrite, UA: NEGATIVE
RBC, UA: NEGATIVE
Specific Gravity, UA: 1.01 (ref 1.005–1.030)
Urobilinogen, Ur: 0.2 mg/dL (ref 0.2–1.0)
pH, UA: 6 (ref 5.0–7.5)

## 2023-09-07 LAB — MICROSCOPIC EXAMINATION
Bacteria, UA: NONE SEEN
RBC, Urine: NONE SEEN /HPF (ref 0–2)
Renal Epithel, UA: NONE SEEN /HPF
Yeast, UA: NONE SEEN

## 2023-09-07 MED ORDER — ROPINIROLE HCL 1 MG PO TABS: 1.0000 mg | ORAL_TABLET | Freq: Every day | ORAL | 5 refills | Status: DC

## 2023-09-07 NOTE — Progress Notes (Unsigned)
 Subjective:  Patient ID: Amber Stephenson, female    DOB: 02/19/1946  Age: 77 y.o. MRN: [id number]  CC: Hospitalization Follow-up (Feeling better. No more bleeding. ), Urinary Tract Infection (One month uti. Taken off abx because she was on it for two long. Frequent urination and discharge.), and Medication Problem (Would liek to discuss medications. )   HPI Amber Stephenson presents for hospital visit to the emergency room onApril 3.  She has had no further rectal bleeding since that time.  She is concerned for diverticulosis and hemorrhoids.  She is having no pain currently.  She also requests refill of her  It is she had a UTI recently and is leaving in specimen for recheck of that.  She does have some frequency of urination.  Working well for her.     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), Benign paroxysmal positional vertigo due to bilateral vestibular disorder (05/20/2019), Bipolar 1 disorder (HCC) (01/23/2015), CAD (coronary artery disease), Cardiomegaly (01/12/2018), CHF (congestive heart failure) (06/08/2022), Chronic back pain (01/04/2015), Chronic constipation (04/25/2020), Chronic diastolic heart failure (HCC) (13/12/6576), Chronic pain syndrome (08/22/2019), Chronic post-traumatic stress disorder (PTSD) (12/06/2020), Diabetic neuropathy (HCC) (02/06/2016), Dyslipidemia (09/24/2020), Essential hypertension, Functional diarrhea (10/26/2020), Herpes genitalis in women (07/16/2015), History of adenomatous polyp of colon (05/21/2019), History of radiation therapy, Insomnia  (01/23/2015), Migraine headache with aura (02/12/2016), Myofascial pain dysfunction syndrome (08/22/2019), Non-alcoholic fatty liver disease (46/96/2952), OSA (obstructive sleep apnea) (02/24/2019), Osteopenia (12/06/2020), Pinguecula (12/06/2020), Presbyopia (12/06/2020), Pulmonary hypertension, RLS (restless legs syndrome) (04/27/2015), Tremor, essential (12/11/2021), and Type II diabetes mellitus (HCC) (09/24/2020).   She has a past surgical history that includes THIGH SURGERY; Shoulder surgery (Right); Breast reduction surgery; Eye surgery (Right); Hammer toe surgery; LEFT HEART CATH AND CORONARY ANGIOGRAPHY (N/A, 02/03/2018); and Reverse shoulder arthroplasty (Right, 08/19/2019).   Her family history includes Alcohol abuse in her sister; Alzheimer's disease in her father; Diabetes in her brother, mother, and sister; Heart disease in her brother, mother, and sister; Mental illness in her brother; Stroke in her brother.She reports that she has never smoked. She has never used smokeless tobacco. She reports that she does not currently use alcohol. She reports that she does not use drugs.    ROS Review of Systems  Constitutional: Negative.   HENT:  Negative for congestion.   Eyes:  Negative for visual disturbance.  Respiratory:  Negative for shortness of breath.   Cardiovascular:  Negative for chest pain.  Gastrointestinal:  Positive for constipation. Negative for abdominal pain, diarrhea, nausea and vomiting.  Genitourinary:  Negative for difficulty urinating.  Musculoskeletal:  Negative for arthralgias and myalgias.  Neurological:  Negative for headaches.  Psychiatric/Behavioral:  Negative for sleep disturbance.     Objective:  BP 122/73   Pulse 83   Temp 98.2 F (36.8 C)   Ht 5\' 2"  (1.575 m)   Wt 186 lb (84.4 kg)   SpO2 98%   BMI 34.02 kg/m   BP Readings from Last 3 Encounters:  09/07/23 122/73  09/03/23 (!) 152/82  09/02/23 126/71  Wt Readings from Last 3 Encounters:   09/07/23 186 lb (84.4 kg)  09/02/23 186 lb (84.4 kg)  08/31/23 185 lb 3.2 oz (84 kg)     Physical Exam Constitutional:      General: She is not in acute distress.    Appearance: She is well-developed. She is ill-appearing.  HENT:     Head: Normocephalic and atraumatic.  Eyes:     Conjunctiva/sclera: Conjunctivae normal.     Pupils: Pupils are equal, round, and reactive to light.  Neck:     Thyroid: No thyromegaly.  Cardiovascular:     Rate and Rhythm: Normal rate and regular rhythm.     Heart sounds: Normal heart sounds. No murmur heard. Pulmonary:     Effort: Pulmonary effort is normal. No respiratory distress.     Breath sounds: Normal breath sounds. No wheezing or rales.  Abdominal:     General: Bowel sounds are normal. There is no distension.     Palpations: Abdomen is soft.     Tenderness: There is no abdominal tenderness.  Musculoskeletal:        General: Normal range of motion.     Cervical back: Normal range of motion and neck supple.  Lymphadenopathy:     Cervical: No cervical adenopathy.  Skin:    General: Skin is warm and dry.  Neurological:     Mental Status: She is alert and oriented to person, place, and time.  Psychiatric:        Behavior: Behavior normal.        Thought Content: Thought content normal.        Judgment: Judgment normal.      Assessment & Plan:  Dysuria -     Urinalysis, Routine w reflex microscopic -     Urine Culture  RLS (restless legs syndrome) -     rOPINIRole HCl; Take 1 tablet (1 mg total) by mouth at bedtime. For leg cramps  Dispense: 30 tablet; Refill: 5  Chronic constipation  Other orders -     Microscopic Examination     Follow-up: Return if symptoms worsen or fail to improve.  Mechele Claude, M.D.

## 2023-09-08 ENCOUNTER — Encounter: Payer: Self-pay | Admitting: Family Medicine

## 2023-09-08 LAB — URINE CULTURE

## 2023-09-10 DIAGNOSIS — H52203 Unspecified astigmatism, bilateral: Secondary | ICD-10-CM | POA: Diagnosis not present

## 2023-09-10 DIAGNOSIS — Z961 Presence of intraocular lens: Secondary | ICD-10-CM | POA: Diagnosis not present

## 2023-09-10 DIAGNOSIS — E119 Type 2 diabetes mellitus without complications: Secondary | ICD-10-CM | POA: Diagnosis not present

## 2023-09-10 LAB — HM DIABETES EYE EXAM

## 2023-09-11 ENCOUNTER — Telehealth: Payer: Self-pay

## 2023-09-11 DIAGNOSIS — Z7901 Long term (current) use of anticoagulants: Secondary | ICD-10-CM | POA: Diagnosis not present

## 2023-09-11 DIAGNOSIS — D649 Anemia, unspecified: Secondary | ICD-10-CM | POA: Diagnosis not present

## 2023-09-11 DIAGNOSIS — R109 Unspecified abdominal pain: Secondary | ICD-10-CM | POA: Diagnosis not present

## 2023-09-11 DIAGNOSIS — I4891 Unspecified atrial fibrillation: Secondary | ICD-10-CM | POA: Diagnosis not present

## 2023-09-11 DIAGNOSIS — R195 Other fecal abnormalities: Secondary | ICD-10-CM | POA: Diagnosis not present

## 2023-09-11 NOTE — Telephone Encounter (Signed)
   Pre-operative Risk Assessment    Patient Name: Amber Stephenson  DOB: [birth year] MRN: [id number]   Date of last office visit: 08/27/23 Armanda Magic, MD Date of next office visit: NONE   Request for Surgical Clearance    Procedure:   COLONOSCOPY/ ENDOSCOPY  Date of Surgery:  Clearance 10/14/23                                Surgeon:  DR Willis Modena Surgeon's Group or Practice Name:  [facility] Phone number:  [phone] Fax number:  [phone]   Type of Clearance Requested:   - Medical  - Pharmacy:  Hold Apixaban (Eliquis) 2 DAYS PRIOR   Type of Anesthesia:   PROPOFOL   Additional requests/questions:    Signed, Marlow Baars   09/11/2023, 2:33 PM

## 2023-09-11 NOTE — Telephone Encounter (Signed)
 Called requesting office front desk staff stated reason patient is getting procedure done is due to abnormal stool test and anemia but were not able to comment on urgency I left a message to surgeon assistant and will wait for their call

## 2023-09-11 NOTE — Telephone Encounter (Signed)
 Pt is pending a PET stress test, scheduled for 11/18/23.   Callback, can you please reach out to the office to see if colonoscopy is urgent. I see a recent ER visit for rectal bleeding.

## 2023-09-14 ENCOUNTER — Ambulatory Visit: Payer: Medicare Other | Admitting: Family Medicine

## 2023-09-14 ENCOUNTER — Telehealth: Payer: Self-pay | Admitting: Registered Nurse

## 2023-09-14 ENCOUNTER — Telehealth: Payer: Self-pay | Admitting: Physical Medicine and Rehabilitation

## 2023-09-14 DIAGNOSIS — D509 Iron deficiency anemia, unspecified: Secondary | ICD-10-CM | POA: Diagnosis not present

## 2023-09-14 DIAGNOSIS — R109 Unspecified abdominal pain: Secondary | ICD-10-CM | POA: Diagnosis not present

## 2023-09-14 NOTE — Telephone Encounter (Signed)
 Patient with afib on Eliquis for anticoagulation.    Procedure: colonoscopy/endoscopy Date of procedure: 10/14/23   CHA2DS2-VASc Score = 7   This indicates a 11.2% annual risk of stroke. The patient's score is based upon: CHF History: 1 HTN History: 1 Diabetes History: 1 Stroke History: 0 Vascular Disease History: 1 Age Score: 2 Gender Score: 1      CrCl 63 ml/min Platelet count 219  Per office protocol, patient can hold Eliquis for 2 days prior to procedure.     **This guidance is not considered finalized until pre-operative APP has relayed final recommendations.**

## 2023-09-14 NOTE — Telephone Encounter (Signed)
 Mohamed from [facility] called in needs clarification on a medication , please contact pharmacy at [phone] , they did not leave medication information

## 2023-09-14 NOTE — Telephone Encounter (Signed)
 Patient called in requesting us  to contact [facility] , patient states medication Oxycotin should be IR and ER was sent in which her insurance does not cover .   Please contact [facility] on file

## 2023-09-14 NOTE — Telephone Encounter (Signed)
 [facility] was called. Spoke with Mohammed the Pharmacist.  Oxycontin was ordered and Oxycodone IR.  PMP was Reviewed.  Ms. Trettin was Prescribed Oxycodone IR by Dr Lovorn and Oxycontin was Discontinued.  Mohammed, verbalizes understanding.

## 2023-09-14 NOTE — Telephone Encounter (Signed)
 Will fax notes to requesting office to please call our office back clarify if procedure is of URGENT MATTER. We see that CMA from our office s/w GI and was told anemia and abnormal stool test but was never told if truly URGENT. Please respond to our office as pt has a PET stress test scheduled for 11/18/23. Please see notes from preop APP [facility].

## 2023-09-14 NOTE — Telephone Encounter (Signed)
 Return [facility] call ,

## 2023-09-15 ENCOUNTER — Encounter: Payer: Self-pay | Admitting: Family Medicine

## 2023-09-15 DIAGNOSIS — M79675 Pain in left toe(s): Secondary | ICD-10-CM | POA: Diagnosis not present

## 2023-09-15 DIAGNOSIS — M2042 Other hammer toe(s) (acquired), left foot: Secondary | ICD-10-CM | POA: Diagnosis not present

## 2023-09-16 NOTE — Telephone Encounter (Signed)
 I faxed notes x 2 to requesting office in regard to urgency, see previous notes.

## 2023-09-17 NOTE — Telephone Encounter (Signed)
 Our office has tried x 2 reach the requesting office in regard to if the procedure is urgent matter; please see all previous notes.   I will send this back to preop APP for further instructions.

## 2023-09-18 NOTE — Telephone Encounter (Signed)
 I will update the requesting office to see the notes from preop APP Charles Connor, NP. We have been trying to reach the requesting office to determine urgency, see previous notes.   Gerald Kitty., NP  You13 hours ago (8:30 PM)    Please advise a clearance will be granted following completion of PET stress test scheduled for 11/18/2023.

## 2023-09-18 NOTE — Telephone Encounter (Signed)
 I will remove from the preop call back pool at this time.

## 2023-09-21 ENCOUNTER — Encounter: Payer: Self-pay | Admitting: Internal Medicine

## 2023-09-21 ENCOUNTER — Ambulatory Visit (INDEPENDENT_AMBULATORY_CARE_PROVIDER_SITE_OTHER): Payer: Medicare Other | Admitting: Internal Medicine

## 2023-09-21 VITALS — BP 112/68 | HR 108 | Resp 16 | Ht 62.0 in | Wt 181.2 lb

## 2023-09-21 DIAGNOSIS — Z794 Long term (current) use of insulin: Secondary | ICD-10-CM | POA: Diagnosis not present

## 2023-09-21 DIAGNOSIS — E119 Type 2 diabetes mellitus without complications: Secondary | ICD-10-CM

## 2023-09-21 LAB — POCT GLYCOSYLATED HEMOGLOBIN (HGB A1C): Hemoglobin A1C: 6.7 % — AB (ref 4.0–5.6)

## 2023-09-21 MED ORDER — TIRZEPATIDE 12.5 MG/0.5ML ~~LOC~~ SOAJ: 12.5000 mg | SUBCUTANEOUS | 3 refills | Status: DC

## 2023-09-21 MED ORDER — LANTUS SOLOSTAR 100 UNIT/ML ~~LOC~~ SOPN: 30.0000 [IU] | PEN_INJECTOR | Freq: Two times a day (BID) | SUBCUTANEOUS | 4 refills | Status: DC

## 2023-09-21 NOTE — Patient Instructions (Addendum)
-   Decrease Lantus  30 units twice daily  - Increase Mounjaro  12.5 mg weekly  - Take Humalog  4 units with each meal  -Humalog  correctional insulin : ADD extra units on insulin  to your meal-time Humalog   dose if your blood sugars are higher than 160. Use the scale below to help guide you before each meal   Blood sugar before meal Number of units to inject  Less than 155 0 unit  156 - 180 1 units  181 - 205 2 units  206 - 230 3 units  231 - 255 4 units  256 - 280 5 units  281 - 305 6 units  306 - 330 7 units  331 - 355 8 units  356 - 380 9 units   381 - 405 10 units        HOW TO TREAT LOW BLOOD SUGARS (Blood sugar LESS THAN 70 MG/DL) Please follow the RULE OF 15 for the treatment of hypoglycemia treatment (when your (blood sugars are less than 70 mg/dL)   STEP 1: Take 15 grams of carbohydrates when your blood sugar is low, which includes:  3-4 GLUCOSE TABS  OR 3-4 OZ OF JUICE OR REGULAR SODA OR ONE TUBE OF GLUCOSE GEL    STEP 2: RECHECK blood sugar in 15 MINUTES STEP 3: If your blood sugar is still low at the 15 minute recheck --> then, go back to STEP 1 and treat AGAIN with another 15 grams of carbohydrates.

## 2023-09-21 NOTE — Progress Notes (Signed)
 Name: Amber Stephenson  Age/ Sex: 78 y.o., female   MRN/ DOB: [id number], Jul 24, 1945     PCP: [name], MD   Reason for Endocrinology Evaluation: Type 2 Diabetes Mellitus  Initial Endocrine Consultative Visit: 09/24/2020    PATIENT IDENTIFIER: [name] is a 78 y.o. female with a past medical history of T2DM, HTN, Dyslipidemia, OSA, CHF and A.Fib. The patient has followed with Endocrinology clinic since 09/24/2020 for consultative assistance with management of her diabetes.  DIABETIC HISTORY:  [name] was diagnosed with DM in 2007, she has been on basal insulin  for years, prandial insulin  started 2016. GLP-1 agonists started in 2017. Metformin  restarted 2017. Her hemoglobin A1c has ranged from 6.7% in 08/2019, peaking at 10.9% in 04/2020.  Saw Dr. Monte Antonio in 2016 and Dr. Hubert Madden in 2017   On her initial visit to our clinic she had an A1c 9.1% . We increased Metformin , Rybelsus  and adjusted MDI regimen.    Jardiance  discontinues 10/2021 due to recurrent genital skin infections and yeast infections   Switch Rybelsus  to Mounjaro  05/2022  I have attempted to prescribe the OmniPod 02/2023 but she was undergoing radiation therapy and they opted to postpone  SUBJECTIVE:   During the last visit (05/22/2023): A1c 6.6%     Today (09/21/2023): [name] is here for a follow up on diabetes management.  She is accompanied by her spouse today. She checks her blood sugars multiple  times daily through the CGM. Aaron Aas The patient has had hypoglycemic episodes since the last clinic visit. She is symptomatic with these episodes   She presented to the ED for rectal bleed 09/2023, pending colonoscopy  She continues to follow up with neurology for cognitive impairment   She follows with Gyn for endometrial cancer, completed radiation                       She had a follow-up with cardiology , presented to the ED for A.fib 08/19/2023 She does follow-up with podiatry, per an outside  facility, has pain with the left second  hammertoe, podiatrist would like to avoid surgery Continues with palpitations  Denies nausea or vomiting  No changes in bowel movement   HOME DIABETES REGIMEN:  Metformin  500 mg 2 tabs daily  Mounjaro  10 mg weekly Lantus  32 units BID Humalog  6 units TIDQAC CF: Humalog  (BG-130/30) TIDQAC     Statin: yes ACE-I/ARB: no Prior Diabetic Education: no    CONTINUOUS GLUCOSE MONITORING RECORD INTERPRETATION    Dates of Recording: 4/8-4/21/2025  Sensor description: dexcom   Results statistics:   CGM use % of time 97  Average and SD 180/45  Time in range 53 %  % Time Above 180 41  % Time above 250 6  % Time Below target 0     Glycemic patterns summary: BGs are optima at night and fluctuate during the day   Hyperglycemic episodes postprandial  Hypoglycemic episodes occurred not in the past 2 weeks  Overnight periods: Optimal      DIABETIC COMPLICATIONS: Microvascular complications:   Denies: CKD,  retinopathy , neuropathy ( she has this in the charts but denies neuropathy) Last eye exam: Completed 07/2020   Macrovascular complications:   Denies: CAD, PVD, CVA    HISTORY:  Past Medical History:  Past Medical History:  Diagnosis Date   Allergic rhinitis 02/24/2019   Ambulatory dysfunction 04/23/2020   Atrial fibrillation with RVR (HCC) 05/19/2017   Back pain/sacroiliitis--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   Benign paroxysmal positional vertigo due to bilateral vestibular disorder 05/20/2019   Bipolar 1 disorder (HCC) 01/23/2015   with GAD, benzo dependence   CAD (coronary artery disease)    Nonobstructive; Managed by Dr. [name]   Cardiomegaly 01/12/2018   CHF (congestive heart failure) 06/08/2022   Chronic back pain 01/04/2015   Chronic constipation 04/25/2020   Chronic diastolic heart failure (HCC)  56/21/3086   Chronic pain syndrome 08/22/2019   back pain, sacroiliitis   Chronic post-traumatic stress disorder (PTSD) 12/06/2020   Diabetic neuropathy (HCC) 02/06/2016   Dyslipidemia 09/24/2020   Essential hypertension    Functional diarrhea 10/26/2020   Herpes genitalis in women 07/16/2015   History of adenomatous polyp of colon 05/21/2019   Overview:   03/31/17: Colonoscopy: nonadvanced adenoma, microscopic colitis, f/u 5 yrs, [facility]/GAP   History of radiation therapy    Endometrial- 02/18/23-03/31/23-Dr. [name]   Insomnia 01/23/2015   Migraine headache with aura 02/12/2016   Myofascial pain dysfunction syndrome 08/22/2019   Non-alcoholic fatty liver disease 01/12/2018   OSA (obstructive sleep apnea) 02/24/2019   10/09/2018 - HST  - AHI 40.6    Osteopenia 12/06/2020   Rx alendronate  35 mg.   Pinguecula 12/06/2020   Presbyopia 12/06/2020   Pulmonary hypertension    RLS (restless legs syndrome) 04/27/2015   Tremor, essential 12/11/2021   Type II diabetes mellitus (HCC) 09/24/2020   Past Surgical History:  Past Surgical History:  Procedure Laterality Date   BREAST REDUCTION SURGERY     EYE SURGERY Right    cateracts   HAMMER TOE SURGERY     LEFT HEART CATH AND CORONARY ANGIOGRAPHY N/A 02/03/2018   Procedure: LEFT HEART CATH AND CORONARY ANGIOGRAPHY;  Surgeon: [name], Peter M, MD;  Location: [facility] INVASIVE CV LAB;  Service: Cardiovascular;  Laterality: N/A;   REVERSE SHOULDER ARTHROPLASTY Right 08/19/2019   Procedure: REVERSE SHOULDER ARTHROPLASTY;  Surgeon: Winston Hawking, MD;  Location: [facility] ORS;  Service: Orthopedics;  Laterality: Right;  interscalene block   SHOULDER SURGERY Right    "I BROKE MY SHOUDLER   THIGH SURGERY     "TO REMOVE A TUMOR "   Social History:  reports that she has never smoked. She has never used smokeless tobacco. She reports that she does not currently use alcohol . She reports that she does not use drugs. Family History:  Family History  Problem  Relation Age of Onset   Diabetes Mother    Heart disease Mother    Alzheimer's disease Father    Heart disease Sister        CABG   Diabetes Sister    Alcohol  abuse Sister    Stroke Brother    Heart disease Brother    Mental illness Brother    Diabetes Brother    Breast cancer Neg Hx    Ovarian cancer Neg Hx    Colon cancer Neg Hx    Endometrial cancer Neg Hx      HOME MEDICATIONS: Allergies as of 09/21/2023       Reactions   Iodine Anaphylaxis   Ivp Dye [iodinated Contrast Media] Anaphylaxis, Swelling, Other (See Comments)   Throat closes   Latex Other (See Comments)   Latex tape pulls skin with it   Tape Other (See Comments)   Pulls off the skin, if latex   Tizanidine  Other (See Comments)   Weakness, goofy, bad dreams  Medication List        Accurate as of September 21, 2023  2:44 PM. If you have any questions, ask your nurse or doctor.          STOP taking these medications    donepezil  5 MG tablet Commonly known as: ARICEPT  Stopped by: Camilla Cedar Mccabe Gloria   TYLENOL  500 MG tablet Generic drug: acetaminophen  Stopped by: Woodruff Skirvin J Jaishon Krisher       TAKE these medications    albuterol  108 (90 Base) MCG/ACT inhaler Commonly known as: VENTOLIN  HFA Inhale 2 puffs into the lungs every 4 (four) hours as needed for wheezing or shortness of breath.   allopurinol  100 MG tablet Commonly known as: ZYLOPRIM  Take 100 mg by mouth daily.   apixaban  5 MG Tabs tablet Commonly known as: Eliquis  Take 1 tablet (5 mg total) by mouth 2 (two) times daily.   ARIPiprazole  2 MG tablet Commonly known as: ABILIFY  Take 2 mg by mouth at bedtime.   atorvastatin  80 MG tablet Commonly known as: LIPITOR Take 1 tablet (80 mg total) by mouth daily. What changed: when to take this   Belsomra  20 MG Tabs Generic drug: Suvorexant  Take 1 tablet (20 mg total) by mouth at bedtime as needed. What changed: reasons to take this   clotrimazole  1 % vaginal cream Commonly  known as: QC Clotrimazole  Place 1 Applicatorful vaginally at bedtime.   clotrimazole  10 MG troche Commonly known as: MYCELEX  Take 10 mg by mouth in the morning and at bedtime.   cyanocobalamin  1000 MCG tablet Commonly known as: VITAMIN B12 Take 1 tablet (1,000 mcg total) by mouth daily.   Dexcom G7 Sensor Misc CHANGE SENSOR EVERY 10 DAYS   doxepin  10 MG capsule Commonly known as: SINEQUAN  Take 10 mg by mouth at bedtime.   escitalopram  10 MG tablet Commonly known as: LEXAPRO  Take 10 mg by mouth daily.   fluticasone -salmeterol 100-50 MCG/ACT Aepb Commonly known as: Advair Diskus Inhale 1 puff into the lungs 2 (two) times daily. What changed:  when to take this reasons to take this   hydrOXYzine  25 MG capsule Commonly known as: VISTARIL  Take 25 mg by mouth 3 (three) times daily.   insulin  lispro 100 UNIT/ML KwikPen Commonly known as: HumaLOG  KwikPen Max daily 45 units What changed:  how much to take how to take this when to take this   Insulin  Pen Needle 29G X Misc 1 Device by Does not apply route daily in the afternoon.   Lantus  SoloStar 100 UNIT/ML Solostar Pen Generic drug: insulin  glargine Inject 32 Units into the skin 2 (two) times daily.   levocetirizine 5 MG tablet Commonly known as: Xyzal  Allergy 24HR Take 1 tablet (5 mg total) by mouth every evening. For itch   lidocaine  2 % solution Commonly known as: XYLOCAINE  Use as directed 15 mLs in the mouth or throat every 4 (four) hours as needed for mouth pain.   LORazepam  0.5 MG tablet Commonly known as: ATIVAN  Take 0.5-1 mg by mouth See admin instructions. Take 0.5 mg by mouth in the morning and 1 mg at bedtime   losartan  25 MG tablet Commonly known as: COZAAR  Take 25 mg by mouth daily.   MAGNESIUM  PO Take 1 tablet by mouth daily.   meclizine  25 MG tablet Commonly known as: ANTIVERT  Take 1 tablet (25 mg total) by mouth 3 (three) times daily as needed for dizziness.   metFORMIN  500 MG 24 hr  tablet Commonly known as: GLUCOPHAGE -XR Take 2 tablets (  1,000 mg total) by mouth daily with breakfast.   Methocarbamol  1000 MG Tabs Take 1,000 mg by mouth every 6 (six) hours as needed.   metoprolol  tartrate 50 MG tablet Commonly known as: LOPRESSOR  Take 1 tablet (50 mg total) by mouth 2 (two) times daily.   naloxone  4 MG/0.1ML Liqd nasal spray kit Commonly known as: NARCAN  Place 1 spray into the nose daily as needed (for over dose).   nystatin  powder Commonly known as: MYCOSTATIN /NYSTOP  Apply 1 Application topically as needed (rash).   nystatin  ointment Commonly known as: MYCOSTATIN  Apply 1 Application topically 2 (two) times daily as needed (for rashes).   omeprazole 40 MG capsule Commonly known as: PRILOSEC Take 40 mg by mouth daily.   Omnipod 5 G7 Pods (Gen 5) Misc 1 Device by Does not apply route every other day.   ondansetron  4 MG tablet Commonly known as: Zofran  Take 1 tablet (4 mg total) by mouth every 8 (eight) hours as needed for nausea or vomiting. For cancer related nausea   OXcarbazepine  150 MG tablet Commonly known as: TRILEPTAL  Take 1 tablet (150 mg total) by mouth 2 (two) times daily.   Oxycodone  HCl 20 MG Tabs Take 1 tablet (20 mg total) by mouth 2 (two) times daily as needed. Do Not Fill Before 10/05/23   potassium chloride  10 MEQ tablet Commonly known as: KLOR-CON  Take 1 tablet (10 mEq total) by mouth daily.   pregabalin  200 MG capsule Commonly known as: LYRICA  Take 200 mg by mouth 2 (two) times daily.   Prolia  60 MG/ML Sosy injection Generic drug: denosumab  Inject 60 mg into the skin every 6 (six) months.   promethazine  25 MG tablet Commonly known as: PHENERGAN  Take 0.5-1 tablets (12.5-25 mg total) by mouth every 6 (six) hours as needed for refractory nausea / vomiting (cancer related nausea/vomiting).   rOPINIRole  1 MG tablet Commonly known as: REQUIP  Take 1 tablet (1 mg total) by mouth at bedtime. For leg cramps   senna-docusate 8.6-50  MG tablet Commonly known as: Senokot-S Take 1 tablet by mouth at bedtime.   spironolactone  25 MG tablet Commonly known as: ALDACTONE  Take 0.5 tablets (12.5 mg total) by mouth daily.   tirzepatide  10 MG/0.5ML Pen Commonly known as: MOUNJARO  Inject 10 mg into the skin once a week.   Vitamin D  (Cholecalciferol ) 25 MCG (1000 UT) Tabs Take 1,000 Units by mouth daily in the afternoon.         OBJECTIVE:   Vital Signs: BP 112/68   Pulse (!) 108   Resp 16   Ht 5\' 2"  (1.575 m)   Wt 181 lb 3.2 oz (82.2 kg)   SpO2 97%   BMI 33.14 kg/m   Wt Readings from Last 3 Encounters:  09/21/23 181 lb 3.2 oz (82.2 kg)  09/07/23 186 lb (84.4 kg)  09/02/23 186 lb (84.4 kg)     Exam: General: Pt appears well and is in NAD  Lungs: Clear with good BS bilat   Heart: RRR   Extremities: No pretibial edema.   Neuro: MS is good with appropriate affect, pt is alert and Ox3    DM foot exam: 09/21/2023   The skin of the feet is without sores or ulcerations, left 2nd toe deformity  The pedal pulses are 1+ on right and 1+ on left. The sensation is intact to a screening 5.07, 10 gram monofilament bilaterally   DATA REVIEWED:  Lab Results  Component Value Date   HGBA1C 6.7 (A) 09/21/2023   HGBA1C  6.8 (H) 06/15/2023   HGBA1C 6.6 (A) 05/22/2023    Latest Reference Range & Units 09/03/23 17:58  Sodium 135 - 145 mmol/L 131 (L)  Potassium 3.5 - 5.1 mmol/L 3.8  Chloride 98 - 111 mmol/L 95 (L)  CO2 22 - 32 mmol/L 26  Glucose 70 - 99 mg/dL 85  BUN 8 - 23 mg/dL 12  Creatinine 1.30 - 8.65 mg/dL 7.84  Calcium  8.9 - 10.3 mg/dL 9.1  Anion gap 5 - 15  10  Alkaline Phosphatase 38 - 126 U/L 76  Albumin  3.5 - 5.0 g/dL 3.7  AST 15 - 41 U/L 19  ALT 0 - 44 U/L 15  Total Protein 6.5 - 8.1 g/dL 7.4  Total Bilirubin 0.0 - 1.2 mg/dL 0.6  GFR, Estimated >69 mL/min >60      Old records , labs and images have been reviewed.   ASSESSMENT / PLAN / RECOMMENDATIONS:   1) Type 2 Diabetes Mellitus,  Optimally  controlled, Without complications - Most recent A1c of 6.7 %. Goal A1c < 7.0 %.     -A1c continues to be optimal -I will decrease Lantus  as below - I will decrease Humalog , the patient has not been taking this consistently, we did discuss postprandial hyperglycemia up to 350 Mg/DL, I will decrease prandial insulin   MEDICATIONS:  -Continue Metformin  500 mg XR, 2 tabs daily -Decrease Lantus  30 units twice daily - Increase Mounjaro  12.5 mg weekly - Decrease Humalog  4 units TIDQAC -Continue correction factor: Humalog  (BG -130/25) 3 times daily before every meal   EDUCATION / INSTRUCTIONS: BG monitoring instructions: Patient is instructed to check her blood sugars 3 times a day, before meals . Call [facility] if: BG persistently < 70 I reviewed the Rule of 15 for the treatment of hypoglycemia in detail with the patient. Literature supplied.   2) Diabetic complications:  Eye: Does not have known diabetic retinopathy.  Neuro/ Feet: Does not have known diabetic peripheral neuropathy .  Renal: Patient does not have known baseline CKD. She   is not on an ACEI/ARB at present.    F/U in 4 months     Signed electronically by: Natale Bail, MD  [facility]  [facility] [address] [place], [place] [address] Phone: [phone] FAX: [phone]   CC: [name], MD [address] [place] [address] Phone: [phone]  Fax: [phone]  Return to Endocrinology clinic as below: Future Appointments  Date Time Provider Department Center  10/05/2023  1:30 PM [name], MD [facility] None  10/29/2023  1:00 PM Jodi Munroe, NP [facility] CPR  11/18/2023 11:20 AM [facility] PET CT 1 [facility] [place]  12/28/2023  1:00 PM Lovorn, Jacqlyn Matas, MD [facility] CPR  01/04/2024  1:00 PM [facility] ECHO 4 [id number]  01/14/2024 10:30 AM [name], MD [facility] None  01/26/2024  8:40 AM [facility]-ANNUAL  WELLNESS VISIT [facility] None  03/15/2024 11:30 AM Alane Allen, Adriane Albe, PA-C [facility] None

## 2023-09-22 ENCOUNTER — Telehealth (HOSPITAL_COMMUNITY): Payer: Self-pay | Admitting: *Deleted

## 2023-09-22 ENCOUNTER — Encounter: Payer: Self-pay | Admitting: Internal Medicine

## 2023-09-22 NOTE — Telephone Encounter (Signed)
 Reaching out to patient to offer assistance regarding upcoming cardiac imaging study; pt's husband answered phone and verbalizes understanding of appt date/time, parking situation and where to check in, pre-test NPO status; name and call back number provided for further questions should they arise  Chase Copping RN Navigator Cardiac Imaging [facility] [phone] office [phone] cell  Patient's husband is aware that the patient is to avoid caffeine 12 hours prior to her PET appt.

## 2023-09-23 ENCOUNTER — Ambulatory Visit (HOSPITAL_COMMUNITY)
Admission: RE | Admit: 2023-09-23 | Discharge: 2023-09-23 | Disposition: A | Discharge status: Home / Self Care | Source: Ambulatory Visit | Attending: Cardiology | Admitting: Cardiology

## 2023-09-23 DIAGNOSIS — I2510 Atherosclerotic heart disease of native coronary artery without angina pectoris: Secondary | ICD-10-CM | POA: Diagnosis not present

## 2023-09-23 DIAGNOSIS — I4821 Permanent atrial fibrillation: Secondary | ICD-10-CM | POA: Diagnosis not present

## 2023-09-23 DIAGNOSIS — I10 Essential (primary) hypertension: Secondary | ICD-10-CM | POA: Diagnosis not present

## 2023-09-23 DIAGNOSIS — I5032 Chronic diastolic (congestive) heart failure: Secondary | ICD-10-CM | POA: Diagnosis not present

## 2023-09-23 DIAGNOSIS — E785 Hyperlipidemia, unspecified: Secondary | ICD-10-CM | POA: Insufficient documentation

## 2023-09-23 LAB — NM PET CT CARDIAC PERFUSION MULTI W/ABSOLUTE BLOODFLOW
LV dias vol: 78 mL (ref 46–106)
MBFR: 2.52
Nuc Rest EF: 50 %
Nuc Stress EF: 51 %
Peak HR: 96 {beats}/min
Rest HR: 79 {beats}/min
Rest MBF: 0.66 ml/g/min
Rest Nuclear Isotope Dose: 21.4 mCi
Rest perfusion cavity size (mL): 78 mL
ST Depression (mm): 0 mm
Stress MBF: 1.66 ml/g/min
Stress Nuclear Isotope Dose: 21.4 mCi
Stress perfusion cavity size (mL): 86 mL
TID: 1.11

## 2023-09-23 MED ORDER — REGADENOSON 0.4 MG/5ML IV SOLN
0.4000 mg | Freq: Once | INTRAVENOUS | Status: AC
  Administered 2023-09-23: 0.4 mg via INTRAVENOUS

## 2023-09-23 MED ORDER — REGADENOSON 0.4 MG/5ML IV SOLN
INTRAVENOUS | Status: AC
Start: 2023-09-23 — End: ?
  Filled 2023-09-23: qty 5

## 2023-09-23 MED ORDER — REGADENOSON 0.4 MG/5ML IV SOLN
INTRAVENOUS | Status: AC
  Filled 2023-09-23: qty 5

## 2023-09-23 MED ORDER — RUBIDIUM RB82 GENERATOR (RUBYFILL)
21.4500 | PACK | Freq: Once | INTRAVENOUS | Status: AC
  Administered 2023-09-23: 21.45 via INTRAVENOUS

## 2023-09-23 MED ORDER — RUBIDIUM RB82 GENERATOR (RUBYFILL)
21.4100 | PACK | Freq: Once | INTRAVENOUS | Status: AC
  Administered 2023-09-23: 21.41 via INTRAVENOUS

## 2023-09-24 ENCOUNTER — Telehealth: Payer: Self-pay | Admitting: *Deleted

## 2023-09-24 NOTE — Telephone Encounter (Signed)
     Primary Cardiologist: Gaylyn Keas, MD  Chart reviewed as part of pre-operative protocol coverage. Given past medical history and time since last visit, based on ACC/AHA guidelines, Amber Stephenson would be at acceptable risk for the planned procedure without further cardiovascular testing.   Procedure: ENDOSCOPY & COLONOSCOPY  Date of procedure: 10/14/23   CHA2DS2-VASc Score = 7  This indicates a 11.2% annual risk of stroke. The patient's score is based upon: CHF History: 1 HTN History: 1 Diabetes History: 1 Stroke History: 0 Vascular Disease History: 1 Age Score: 2 Gender Score: 1     CrCl 82 ml/min Platelet count 219K   Due to elevated risk score, recommend holding Eliquis  for 1 day  I will route this recommendation to the requesting party via Epic fax function and remove from pre-op pool.  Please call with questions.  Chet Cota. Damel Querry NP-C     09/24/2023, 2:51 PM [facility] [address] Office [phone] Fax [phone]

## 2023-09-24 NOTE — Telephone Encounter (Signed)
 Patient with diagnosis of A Fib on Eliquis  for anticoagulation.    Procedure: ENDOSCOPY & COLONOSCOPY  Date of procedure: 10/14/23  CHA2DS2-VASc Score = 7  This indicates a 11.2% annual risk of stroke. The patient's score is based upon: CHF History: 1 HTN History: 1 Diabetes History: 1 Stroke History: 0 Vascular Disease History: 1 Age Score: 2 Gender Score: 1    CrCl 82 ml/min Platelet count 219K  Due to elevated risk score, recommend holding Eliquis  for 1 day   **This guidance is not considered finalized until pre-operative APP has relayed final recommendations.**

## 2023-09-24 NOTE — Telephone Encounter (Signed)
   Pre-operative Risk Assessment    Patient Name: ANJALINA BERGEVIN  DOB: 11-07-1945 MRN: [id number]   Date of last office visit: 08/27/2023 Date of next office visit: N/A   Request for Surgical Clearance    Procedure:   ENDOSCOPY & COLONOSCOPY  Date of Surgery:  Clearance 10/14/23                                Surgeon:  DR. Evangeline Hilts Surgeon's Group or Practice Name:  [facility] Phone number:  [phone] Fax number:  [phone]   Type of Clearance Requested:   - Medical  - Pharmacy:  Hold Apixaban  (Eliquis ) NOT INDICATED   Type of Anesthesia:   PROPOFOL    Additional requests/questions:    Berenda Breaker   09/24/2023, 2:04 PM

## 2023-09-25 ENCOUNTER — Telehealth: Payer: Self-pay | Admitting: Cardiology

## 2023-09-25 NOTE — Telephone Encounter (Signed)
 Jacqueline Matsu, MD 09/24/2023  9:31 AM EDT     Please let patient know that stress test was fine   Jacqueline Matsu, MD 09/23/2023  3:32 PM EDT     Non cardiac portion of Stress PET CT showed aortic plaque and possible enlarged pulmonary trunk that can be seen with pulmonary HTN but echo last summer showed normal RV and no PHTN>>Cardiac portion of study pending    The patient has been notified of the result and verbalized understanding.  All questions (if any) were answered.  Pt verbalized understanding and agrees with this plan.

## 2023-09-25 NOTE — Telephone Encounter (Signed)
 Patient calling in about her results. States that she got conflicting information from out office. Please advise

## 2023-09-28 ENCOUNTER — Telehealth: Payer: Self-pay

## 2023-09-28 ENCOUNTER — Other Ambulatory Visit: Payer: Self-pay

## 2023-09-28 ENCOUNTER — Ambulatory Visit: Payer: Self-pay

## 2023-09-28 ENCOUNTER — Other Ambulatory Visit: Payer: Self-pay | Admitting: Family Medicine

## 2023-09-28 DIAGNOSIS — R0602 Shortness of breath: Secondary | ICD-10-CM

## 2023-09-28 DIAGNOSIS — J121 Respiratory syncytial virus pneumonia: Secondary | ICD-10-CM

## 2023-09-28 DIAGNOSIS — R051 Acute cough: Secondary | ICD-10-CM

## 2023-09-28 MED ORDER — DEXCOM G7 SENSOR MISC: 3 refills | Status: DC

## 2023-09-28 NOTE — Telephone Encounter (Signed)
 Copied from CRM [id number]. Topic: Clinical - Red Word Triage >> Sep 28, 2023 12:25 PM Alysia Jumbo S wrote: [facility] that prompted transfer to Nurse Triage: Cough w/ mucous    Chief Complaint: Productive cough, sore throat, wheezing Symptoms: Above Frequency: Last week Pertinent Negatives: Patient denies fever Disposition: [] ED /[] Urgent Care (no appt availability in office) / [x] Appointment(In office/virtual)/ []  [facility]/ [] Home Care/ [] Refused Recommended Disposition /[] [facility]/ []  Follow-up  Additional Notes: Agrees with appointment.  Reason for Disposition  [1] MILD difficulty breathing (e.g., minimal/no SOB at rest, SOB with walking, pulse <100) AND [2] still present when not coughing  Answer Assessment - Initial Assessment Questions 1. ONSET: "When did the cough begin?"      Last week 2. SEVERITY: "How bad is the cough today?"      Severe 3. SPUTUM: "Describe the color of your sputum" (none, dry cough; clear, white, yellow, green)     Unsure 4. HEMOPTYSIS: "Are you coughing up any blood?" If so ask: "How much?" (flecks, streaks, tablespoons, etc.)     No 5. DIFFICULTY BREATHING: "Are you having difficulty breathing?" If Yes, ask: "How bad is it?" (e.g., mild, moderate, severe)    - MILD: No SOB at rest, mild SOB with walking, speaks normally in sentences, can lie down, no retractions, pulse < 100.    - MODERATE: SOB at rest, SOB with minimal exertion and prefers to sit, cannot lie down flat, speaks in phrases, mild retractions, audible wheezing, pulse 100-120.    - SEVERE: Very SOB at rest, speaks in single words, struggling to breathe, sitting hunched forward, retractions, pulse > 120      Mild 6. FEVER: "Do you have a fever?" If Yes, ask: "What is your temperature, how was it measured, and when did it start?"     No 7. CARDIAC HISTORY: "Do you have any history of heart disease?" (e.g., heart attack, congestive heart failure)      Yes 8. LUNG HISTORY:  "Do you have any history of lung disease?"  (e.g., pulmonary embolus, asthma, emphysema)     No 9. PE RISK FACTORS: "Do you have a history of blood clots?" (or: recent major surgery, recent prolonged travel, bedridden)     No 10. OTHER SYMPTOMS: "Do you have any other symptoms?" (e.g., runny nose, wheezing, chest pain)       Gurgling, rattling 11. PREGNANCY: "Is there any chance you are pregnant?" "When was your last menstrual period?"       no 12. TRAVEL: "Have you traveled out of the country in the last month?" (e.g., travel history, exposures)       no  Protocols used: Cough - Acute Productive-A-AH

## 2023-09-28 NOTE — Telephone Encounter (Signed)
 Copied from CRM [id number]. Topic: Clinical - Medical Advice >> Sep 28, 2023  8:34 AM Alpha Arts wrote: Reason for CRM: Patient is wondering when she should stop her apixaban  (ELIQUIS ) 5 MG TABS tablet for her surgery on 10/14/23.  Callback #: [id number]

## 2023-09-28 NOTE — Telephone Encounter (Signed)
 APPT MADE

## 2023-09-28 NOTE — Telephone Encounter (Signed)
 3 days in advance of surgery

## 2023-09-29 ENCOUNTER — Ambulatory Visit (INDEPENDENT_AMBULATORY_CARE_PROVIDER_SITE_OTHER): Admitting: Family Medicine

## 2023-09-29 ENCOUNTER — Encounter: Payer: Self-pay | Admitting: Family Medicine

## 2023-09-29 ENCOUNTER — Ambulatory Visit (INDEPENDENT_AMBULATORY_CARE_PROVIDER_SITE_OTHER)

## 2023-09-29 ENCOUNTER — Telehealth: Payer: Self-pay

## 2023-09-29 VITALS — BP 145/80 | HR 77 | Temp 97.9°F | Ht 62.0 in | Wt 188.0 lb

## 2023-09-29 DIAGNOSIS — R051 Acute cough: Secondary | ICD-10-CM

## 2023-09-29 DIAGNOSIS — J40 Bronchitis, not specified as acute or chronic: Secondary | ICD-10-CM

## 2023-09-29 DIAGNOSIS — J329 Chronic sinusitis, unspecified: Secondary | ICD-10-CM

## 2023-09-29 DIAGNOSIS — R0602 Shortness of breath: Secondary | ICD-10-CM

## 2023-09-29 DIAGNOSIS — I272 Pulmonary hypertension, unspecified: Secondary | ICD-10-CM

## 2023-09-29 DIAGNOSIS — I3481 Nonrheumatic mitral (valve) annulus calcification: Secondary | ICD-10-CM | POA: Diagnosis not present

## 2023-09-29 DIAGNOSIS — R918 Other nonspecific abnormal finding of lung field: Secondary | ICD-10-CM | POA: Diagnosis not present

## 2023-09-29 DIAGNOSIS — Z471 Aftercare following joint replacement surgery: Secondary | ICD-10-CM | POA: Diagnosis not present

## 2023-09-29 DIAGNOSIS — Z96611 Presence of right artificial shoulder joint: Secondary | ICD-10-CM | POA: Diagnosis not present

## 2023-09-29 MED ORDER — HYDROCODONE BIT-HOMATROP MBR 5-1.5 MG/5ML PO SOLN: 5.0000 mL | Freq: Four times a day (QID) | ORAL | 0 refills | Status: AC | PRN

## 2023-09-29 MED ORDER — HYDROXYZINE PAMOATE 25 MG PO CAPS: 25.0000 mg | ORAL_CAPSULE | Freq: Three times a day (TID) | ORAL | 5 refills | Status: DC

## 2023-09-29 MED ORDER — PREDNISONE 10 MG PO TABS
ORAL_TABLET | ORAL | 0 refills | Status: DC
Start: 2023-09-29 — End: 2023-10-09

## 2023-09-29 MED ORDER — MOXIFLOXACIN HCL 400 MG PO TABS: 400.0000 mg | ORAL_TABLET | Freq: Every day | ORAL | 0 refills | Status: DC

## 2023-09-29 NOTE — Telephone Encounter (Signed)
 Spoke with pt regarding results. Pt stated she has an upcoming echo scheduled on 01/04/24 so a repeat echo was not ordered. Pt verbalized understanding. All questions if any were answered.

## 2023-09-29 NOTE — Progress Notes (Signed)
 Subjective:  Patient ID: Amber Stephenson, female    DOB: 1945-07-01  Age: 78 y.o. MRN: [id number]  CC: breathing issue (Pt has Sob, wheezing, and cough. Just doing boost oxygen . Hears fluid in lungs. Weight going up so believes there is fluid retention.  Very fatigued.) and Rectal Bleeding (It has increased in the last 2 weeks. Apt with colonoscopy doctor. Pt was anemic when seen at hospital for this. )   HPI Amber Stephenson presents for symptoms noted above have been present for about 5 days.  She is concerned about swelling.  She is concerned about heart failure leading to fluid in the lungs.  She has a cough and is bringing up purulent mucus.  She has no fever.  She is moderately short of breath.     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), Benign paroxysmal positional vertigo due to bilateral vestibular disorder (05/20/2019), Bipolar 1 disorder (HCC) (01/23/2015), CAD (coronary artery disease), Cardiomegaly (01/12/2018), CHF (congestive heart failure) (06/08/2022), Chronic back pain (01/04/2015), Chronic constipation (04/25/2020), Chronic diastolic heart failure (HCC) (69/62/9528), Chronic pain syndrome (08/22/2019), Chronic post-traumatic stress disorder (PTSD) (12/06/2020), Diabetic neuropathy (HCC) (02/06/2016), Dyslipidemia (09/24/2020), Essential hypertension, Functional diarrhea (10/26/2020), Herpes genitalis in women (07/16/2015), History of adenomatous polyp of colon (05/21/2019), History of radiation therapy, Insomnia (01/23/2015), Migraine headache with aura  (02/12/2016), Myofascial pain dysfunction syndrome (08/22/2019), Non-alcoholic fatty liver disease (41/32/4401), OSA (obstructive sleep apnea) (02/24/2019), Osteopenia (12/06/2020), Pinguecula (12/06/2020), Presbyopia (12/06/2020), Pulmonary hypertension, RLS (restless legs syndrome) (04/27/2015), Tremor, essential (12/11/2021), and Type II diabetes mellitus (HCC) (09/24/2020).   She has a past surgical history that includes THIGH SURGERY; Shoulder surgery (Right); Breast reduction surgery; Eye surgery (Right); Hammer toe surgery; LEFT HEART CATH AND CORONARY ANGIOGRAPHY (N/A, 02/03/2018); and Reverse shoulder arthroplasty (Right, 08/19/2019).   Her family history includes Alcohol  abuse in her sister; Alzheimer's disease in her father; Diabetes in her brother, mother, and sister; Heart disease in her brother, mother, and sister; Mental illness in her brother; Stroke in her brother.She reports that she has never smoked. She has never used smokeless tobacco. She reports that she does not currently use alcohol . She reports that she does not use drugs.    ROS Review of Systems  Constitutional:  Negative for activity change, appetite change, chills and fever.  HENT:  Positive for congestion, postnasal drip and sinus pressure. Negative for ear discharge, ear pain, hearing loss, nosebleeds, sneezing and trouble swallowing.   Respiratory:  Positive for cough and shortness of breath. Negative for chest tightness.   Cardiovascular:  Negative for chest pain and palpitations.  Skin:  Negative for rash.  COPD  Objective:  BP (!) 145/80   Pulse 77   Temp 97.9 F (36.6 C)   Ht 5\' 2"  (1.575 m)   Wt 188 lb (85.3 kg)   SpO2 92%   BMI 34.39 kg/m   BP Readings from Last 3 Encounters:  09/29/23 (!) 145/80  09/23/23 117/68  09/21/23 112/68    Wt Readings from Last 3 Encounters:  09/29/23 188 lb (85.3 kg)  09/21/23 181 lb 3.2 oz (82.2 kg)  09/07/23 186 lb (84.4 kg)     Physical Exam Constitutional:       General: She is not in acute distress.    Appearance: She is well-developed.  Cardiovascular:     Rate and Rhythm: Normal rate and regular rhythm.  Pulmonary:     Breath sounds: Wheezing, rhonchi and rales (Faint mid fields not notable at the bases.) present.  Musculoskeletal:        General: Normal range of motion.  Skin:    General: Skin is warm and dry.  Neurological:     Mental Status: She is alert and oriented to person, place, and time.   Chest x-ray: There is cardiomegaly without signs of pulmonary edema or effusion.  No infiltrate noted.   Assessment & Plan:  SOB (shortness of breath) -     DG Chest 2 View; Future -     predniSONE ; Take 5 daily for 2 days followed by 4,3,2 and 1 for 2 days each.  Dispense: 30 tablet; Refill: 0  Acute cough -     DG Chest 2 View; Future -     HYDROcodone  Bit-Homatrop MBr; Take 5 mLs by mouth every 6 (six) hours as needed for up to 5 days for cough.  Dispense: 100 mL; Refill: 0  Sinobronchitis -     Moxifloxacin HCl; Take 1 tablet (400 mg total) by mouth daily.  Dispense: 10 tablet; Refill: 0 -     predniSONE ; Take 5 daily for 2 days followed by 4,3,2 and 1 for 2 days each.  Dispense: 30 tablet; Refill: 0  Other orders -     hydrOXYzine  Pamoate; Take 1 capsule (25 mg total) by mouth 3 (three) times daily.  Dispense: 30 capsule; Refill: 5     Follow-up: No follow-ups on file.  Amber Stephenson, M.D.

## 2023-09-29 NOTE — Telephone Encounter (Signed)
 Patient informed. LS

## 2023-09-29 NOTE — Telephone Encounter (Signed)
-----   Message from Gaylyn Keas sent at 09/28/2023  7:01 PM EDT ----- Her last 2D echo recently did not show any evidence of pulmonary hypertension.  Please repeat 2D echo limited to assess for pulmonary hypertension and size of pulmonary artery ----- Message ----- From: Isobel Marie, RN Sent: 09/24/2023  12:56 PM EDT To: Jacqueline Matsu, MD  The patient has been notified of the result and verbalized understanding.  All questions (if any) were answered. Amber Rob Lagena Strand, RN 09/24/2023 12:55 PM   Pt is wondering what the next steps are in reference to her pulmonary hypertension. Will route to Dr. Micael Adas for her suggestions.

## 2023-09-30 NOTE — Telephone Encounter (Signed)
 I discussed case with Andi Kaufmann who recommends that we go with the 2 day Eliquis  hold in case anything needs to be removed during the scope, which may not be possible with 1 day hold. I will go into 4/24 phone note and addend to relay this update.

## 2023-09-30 NOTE — Telephone Encounter (Signed)
 I am helping in preop today and there was an old duplicate clearance on this patient from earlier in April that was still open.   Lawana Pray NP has since acted on the more updated clearance and finalized recommendation.   There was a discrepancy in the recommendation of holding anticoagulation (4/11 phone note recommended 2 day hold of Eliquis , 4/24 phone note recommended 1 day hold). Chris Pavero was out today. I discussed case with pharmacist Melissa Maccia who recommends that we go with the 2 day Eliquis  hold in case anything needs to be removed during the scope, which may not be possible with 1 day hold.   Since this information was already faxed to the surgeon, I will route to callback pool to please physically call Dr. Audry Leavell office to make this updated recommendation to hold Eliquis  2 days prior to procedure and for them to make sure patient aware.

## 2023-09-30 NOTE — Telephone Encounter (Signed)
 I will call the  requesting office to advise of the Eliquis  hold per pharm-d hold x 2 days prior.  I tried to call the GI office though was on hold for 5+ minutes. I will fax notes to the requesting office to see the notes from Melissa Maccia, [facility] and Dayna Dunn, PAC. Procedure is not until 10/14/23.

## 2023-09-30 NOTE — Telephone Encounter (Signed)
 Helping in preop today. Seems there are two clearances for this procedure. Lawana Pray finalized the clearance in the second phone note dated 09/24/23. There was a difference in the recommendation for Eliquis  hold (1 day per 4/24 phone note and 2 days per 4/11 phone note) so I reached out to M. Maccia via secure chat as C. Pavero is out, to clarify duration. Patient's cardiac PET was expedited to 4/23 and was normal.

## 2023-10-01 ENCOUNTER — Encounter: Payer: Self-pay | Admitting: Family Medicine

## 2023-10-01 DIAGNOSIS — I270 Primary pulmonary hypertension: Secondary | ICD-10-CM | POA: Diagnosis not present

## 2023-10-01 DIAGNOSIS — K921 Melena: Secondary | ICD-10-CM | POA: Diagnosis not present

## 2023-10-01 DIAGNOSIS — D649 Anemia, unspecified: Secondary | ICD-10-CM | POA: Diagnosis not present

## 2023-10-01 DIAGNOSIS — Z7901 Long term (current) use of anticoagulants: Secondary | ICD-10-CM | POA: Diagnosis not present

## 2023-10-01 NOTE — Progress Notes (Signed)
 Your chest x-ray looked normal. Thanks, WS.

## 2023-10-02 ENCOUNTER — Encounter: Payer: Self-pay | Admitting: Psychiatry

## 2023-10-05 ENCOUNTER — Inpatient Hospital Stay: Payer: Medicare Other | Attending: Psychiatry | Admitting: Psychiatry

## 2023-10-05 ENCOUNTER — Encounter: Payer: Self-pay | Admitting: Psychiatry

## 2023-10-05 VITALS — BP 136/94 | HR 83 | Temp 97.9°F | Resp 18 | Ht 61.0 in | Wt 178.0 lb

## 2023-10-05 DIAGNOSIS — K625 Hemorrhage of anus and rectum: Secondary | ICD-10-CM | POA: Diagnosis not present

## 2023-10-05 DIAGNOSIS — E66811 Obesity, class 1: Secondary | ICD-10-CM | POA: Diagnosis not present

## 2023-10-05 DIAGNOSIS — N950 Postmenopausal bleeding: Secondary | ICD-10-CM | POA: Insufficient documentation

## 2023-10-05 DIAGNOSIS — K922 Gastrointestinal hemorrhage, unspecified: Secondary | ICD-10-CM | POA: Diagnosis not present

## 2023-10-05 DIAGNOSIS — K649 Unspecified hemorrhoids: Secondary | ICD-10-CM | POA: Diagnosis not present

## 2023-10-05 DIAGNOSIS — I2510 Atherosclerotic heart disease of native coronary artery without angina pectoris: Secondary | ICD-10-CM | POA: Diagnosis not present

## 2023-10-05 DIAGNOSIS — D62 Acute posthemorrhagic anemia: Secondary | ICD-10-CM | POA: Diagnosis not present

## 2023-10-05 DIAGNOSIS — R053 Chronic cough: Secondary | ICD-10-CM | POA: Diagnosis not present

## 2023-10-05 DIAGNOSIS — R634 Abnormal weight loss: Secondary | ICD-10-CM | POA: Insufficient documentation

## 2023-10-05 DIAGNOSIS — E1140 Type 2 diabetes mellitus with diabetic neuropathy, unspecified: Secondary | ICD-10-CM | POA: Diagnosis not present

## 2023-10-05 DIAGNOSIS — Z923 Personal history of irradiation: Secondary | ICD-10-CM | POA: Insufficient documentation

## 2023-10-05 DIAGNOSIS — R059 Cough, unspecified: Secondary | ICD-10-CM | POA: Diagnosis not present

## 2023-10-05 DIAGNOSIS — K760 Fatty (change of) liver, not elsewhere classified: Secondary | ICD-10-CM | POA: Diagnosis not present

## 2023-10-05 DIAGNOSIS — I272 Pulmonary hypertension, unspecified: Secondary | ICD-10-CM | POA: Diagnosis not present

## 2023-10-05 DIAGNOSIS — F419 Anxiety disorder, unspecified: Secondary | ICD-10-CM | POA: Diagnosis not present

## 2023-10-05 DIAGNOSIS — R109 Unspecified abdominal pain: Secondary | ICD-10-CM | POA: Diagnosis not present

## 2023-10-05 DIAGNOSIS — E1165 Type 2 diabetes mellitus with hyperglycemia: Secondary | ICD-10-CM | POA: Diagnosis not present

## 2023-10-05 DIAGNOSIS — F319 Bipolar disorder, unspecified: Secondary | ICD-10-CM | POA: Diagnosis not present

## 2023-10-05 DIAGNOSIS — I4891 Unspecified atrial fibrillation: Secondary | ICD-10-CM | POA: Diagnosis not present

## 2023-10-05 DIAGNOSIS — I959 Hypotension, unspecified: Secondary | ICD-10-CM | POA: Diagnosis not present

## 2023-10-05 DIAGNOSIS — C541 Malignant neoplasm of endometrium: Secondary | ICD-10-CM | POA: Diagnosis not present

## 2023-10-05 DIAGNOSIS — Z7901 Long term (current) use of anticoagulants: Secondary | ICD-10-CM | POA: Diagnosis not present

## 2023-10-05 DIAGNOSIS — J9611 Chronic respiratory failure with hypoxia: Secondary | ICD-10-CM | POA: Diagnosis not present

## 2023-10-05 DIAGNOSIS — R350 Frequency of micturition: Secondary | ICD-10-CM | POA: Insufficient documentation

## 2023-10-05 DIAGNOSIS — I5032 Chronic diastolic (congestive) heart failure: Secondary | ICD-10-CM | POA: Diagnosis not present

## 2023-10-05 DIAGNOSIS — Z6832 Body mass index (BMI) 32.0-32.9, adult: Secondary | ICD-10-CM | POA: Diagnosis not present

## 2023-10-05 DIAGNOSIS — G2581 Restless legs syndrome: Secondary | ICD-10-CM | POA: Diagnosis not present

## 2023-10-05 DIAGNOSIS — I110 Hypertensive heart disease with heart failure: Secondary | ICD-10-CM | POA: Diagnosis not present

## 2023-10-05 DIAGNOSIS — G43109 Migraine with aura, not intractable, without status migrainosus: Secondary | ICD-10-CM | POA: Diagnosis not present

## 2023-10-05 DIAGNOSIS — G894 Chronic pain syndrome: Secondary | ICD-10-CM | POA: Diagnosis not present

## 2023-10-05 DIAGNOSIS — I4819 Other persistent atrial fibrillation: Secondary | ICD-10-CM | POA: Diagnosis not present

## 2023-10-05 DIAGNOSIS — E871 Hypo-osmolality and hyponatremia: Secondary | ICD-10-CM | POA: Diagnosis not present

## 2023-10-05 DIAGNOSIS — I517 Cardiomegaly: Secondary | ICD-10-CM | POA: Diagnosis not present

## 2023-10-05 DIAGNOSIS — G4733 Obstructive sleep apnea (adult) (pediatric): Secondary | ICD-10-CM | POA: Diagnosis not present

## 2023-10-05 DIAGNOSIS — K5790 Diverticulosis of intestine, part unspecified, without perforation or abscess without bleeding: Secondary | ICD-10-CM | POA: Diagnosis not present

## 2023-10-05 DIAGNOSIS — R630 Anorexia: Secondary | ICD-10-CM | POA: Insufficient documentation

## 2023-10-05 DIAGNOSIS — E785 Hyperlipidemia, unspecified: Secondary | ICD-10-CM | POA: Diagnosis not present

## 2023-10-05 DIAGNOSIS — R1084 Generalized abdominal pain: Secondary | ICD-10-CM | POA: Diagnosis not present

## 2023-10-05 DIAGNOSIS — K5733 Diverticulitis of large intestine without perforation or abscess with bleeding: Secondary | ICD-10-CM | POA: Diagnosis not present

## 2023-10-05 DIAGNOSIS — Z794 Long term (current) use of insulin: Secondary | ICD-10-CM | POA: Diagnosis not present

## 2023-10-05 NOTE — Progress Notes (Signed)
 Gynecologic Oncology Return Clinic Visit  Date of Service: 10/05/2023 Referring Provider: Jennifer Ozan, DO   Assessment & Plan: Amber Stephenson is a 78 y.o. 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  Endometrial cancer: - NED on exam today. - Post treatment MRI pelvis on 07/08/23 NED.  - Given bleeding and abdominal pain, will get CT A/P now.  - Signs/symptoms of recurrence reviewed. - Continue surveillance with follow-up q71mo initially, can alternate with [name] in Radiation Oncology. - Reviewed that after 5 years NED, will be safe to return to Ob/Gyn.  Rectal bleeding: - CT scan as above - Pt reports she has referral in to GI. Will follow-up outcome from visit.  RTC 49mo with [name], 28mo with Gyn Onc.  [name], MD Gynecologic Oncology   Medical Decision Making I personally spent  TOTAL 35 minutes face-to-face and non-face-to-face in the care of this patient, which includes all pre, intra, and post visit time on the date of service.   ----------------------- Reason for Visit: Follow-up, surveillance  Treatment History: Oncology History  Endometrial cancer (HCC)  11/25/2022 Imaging   Pelvic ultrasound: EMS 8.51mm   12/29/2022 Initial Biopsy   A. ENODMETRIUM, BIOPSY:  - Endometrioid carcinoma, FIGO grade 1 (see comment)   COMMENT: This case was reviewed with [name] who agrees with the above  diagnosis.    12/29/2022 Initial Diagnosis   Endometrial cancer (HCC)   01/13/2023 Imaging   CT chest/abdomen/pelvis: IMPRESSION: 1. Perihilar ground-glass infiltrates in the lungs likely representing edema or multifocal pneumonia. 2. No evidence of metastatic disease seen in the chest, abdomen, or pelvis on noncontrast  imaging. 3. Aortic atherosclerosis 4. Multiple chronic appearing endplate compression deformities in the spine likely indicate osteoporosis.   01/26/2023 Cancer Staging   Staging form: Corpus Uteri - Carcinoma and Carcinosarcoma, AJCC 8th Edition - Clinical stage from 01/26/2023: FIGO Stage IB (cT1b, cN0, cM0) - Signed by [name], MD on 02/03/2023 Histopathologic type: Endometrioid adenocarcinoma, NOS Stage prefix: Initial diagnosis Histologic grade (G): G1 Histologic grading system: 3 grade system   01/26/2023 Imaging   MRI Pelvis: IMPRESSION: 3.9 cm endometrial mass, with suspected 50% myometrial invasion, as above. This is considered borderline for FIGO stage IA/1B by MRI, pending histology.   No evidence of metastatic disease in the pelvis.   02/18/2023 - 03/31/2023 Radiation Therapy   Site/Dose/Technique/Mode:  First Treatment Date: 2023-02-18 - Last Treatment Date: 2023-03-31    Site: Uterus Technique: IMRT Mode: Photon Dose Per Fraction: 1.8 Gy Prescribed Dose (Delivered / Prescribed): 45 Gy / 45 Gy Prescribed Fxs (Delivered / Prescribed): 25 / 25   Site: Uterus - boost treatment  Technique: IMRT Mode: Photon Dose Per Fraction: 2 Gy Prescribed Dose (Delivered / Prescribed): 10 Gy     Interval History: Presents today with her husband.  Denies vaginal bleeding.  Reports that she has been having rectal bleeding for the past month for which she previously went to the emergency department.  Reports that she has been referred to GI and is planning to see [name], appointment not yet scheduled.  Reports that the bleeding can be with or without bowel movements and is bright red.  Denies diarrhea.  Has some lower abdominal pelvic pain as well as increased urinary frequency.  Reports that she has had  decreased appetite for the past 6 months and loss of weight about 20 pounds.  Denies any vaginal bleeding.   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   Benign paroxysmal positional vertigo due to bilateral vestibular disorder 05/20/2019   Bipolar 1 disorder (HCC) 01/23/2015   with GAD, benzo dependence   CAD (coronary artery disease)    Nonobstructive; Managed by Dr. [name]   Cardiomegaly 01/12/2018   CHF (congestive heart failure) 06/08/2022   Chronic back pain 01/04/2015   Chronic constipation 04/25/2020   Chronic diastolic heart failure (HCC) 05/30/2015   Chronic pain syndrome 08/22/2019   back pain, sacroiliitis   Chronic post-traumatic stress disorder (PTSD) 12/06/2020   Diabetic neuropathy (HCC) 02/06/2016   Dyslipidemia 09/24/2020   Essential hypertension    Functional diarrhea 10/26/2020   Herpes genitalis in women 07/16/2015   History of adenomatous polyp of colon 05/21/2019   Overview:   03/31/17: Colonoscopy: nonadvanced adenoma, microscopic colitis, f/u 5 yrs, [facility]/GAP   History of radiation therapy    Endometrial- 02/18/23-03/31/23-Dr. [name]   Insomnia 01/23/2015   Migraine headache with aura 02/12/2016   Myofascial pain dysfunction syndrome 08/22/2019   Non-alcoholic fatty liver disease 01/12/2018   OSA (obstructive sleep apnea) 02/24/2019   10/09/2018 - HST  - AHI 40.6    Osteopenia 12/06/2020   Rx alendronate  35 mg.   Pinguecula 12/06/2020   Presbyopia 12/06/2020   Pulmonary hypertension    RLS (restless legs syndrome) 04/27/2015   Tremor, essential 12/11/2021   Type II diabetes mellitus (HCC) 09/24/2020    Past Surgical History:  Procedure Laterality Date   BREAST REDUCTION SURGERY     EYE SURGERY Right    cateracts   HAMMER TOE SURGERY     LEFT HEART CATH AND CORONARY ANGIOGRAPHY N/A 02/03/2018    Procedure: LEFT HEART CATH AND CORONARY ANGIOGRAPHY;  Surgeon: [name], Peter M, MD;  Location: [facility] INVASIVE CV LAB;  Service: Cardiovascular;  Laterality: N/A;   REVERSE SHOULDER ARTHROPLASTY Right 08/19/2019   Procedure: REVERSE SHOULDER ARTHROPLASTY;  Surgeon: Winston Hawking, MD;  Location: [facility] ORS;  Service: Orthopedics;  Laterality: Right;  interscalene block   SHOULDER SURGERY Right    "I BROKE MY SHOUDLER   THIGH SURGERY     "TO REMOVE A TUMOR "    Family History  Problem Relation Age of Onset   Diabetes Mother    Heart disease Mother    Alzheimer's disease Father    Heart disease Sister        CABG   Diabetes Sister    Alcohol  abuse Sister    Stroke Brother    Heart disease Brother    Mental illness Brother    Diabetes Brother    Breast cancer Neg Hx    Ovarian cancer Neg Hx    Colon cancer Neg Hx    Endometrial cancer Neg Hx     Social History   Socioeconomic History   Marital status: Married    Spouse name: Arma Berkshire   Number of children: 3   Years of education: 15   Highest education level: Associate degree: academic program  Occupational History   Occupation: Retired    Comment: marketing  Tobacco Use   Smoking  status: Never   Smokeless tobacco: Never  Vaping Use   Vaping status: Never Used  Substance and Sexual Activity   Alcohol  use: Not Currently    Alcohol /week: 0.0 standard drinks of alcohol    Drug use: No   Sexual activity: Yes    Partners: Male    Birth control/protection: Post-menopausal  Other Topics Concern   Not on file  Social History Narrative   Lives at home with husband.    They have 3 children who live away - [place] , [place] , and [place] .   She is from [place]  and most of her family lives there.   Her husbands's family live nearby   Right handed.   Social Drivers of Corporate investment banker Strain: Low Risk  (08/13/2023)   Overall Financial Resource Strain (CARDIA)    Difficulty of Paying Living Expenses: Not hard at all   Food Insecurity: No Food Insecurity (08/24/2023)   Hunger Vital Sign    Worried About Running Out of Food in the Last Year: Never true    Ran Out of Food in the Last Year: Never true  Transportation Needs: No Transportation Needs (08/24/2023)   PRAPARE - Administrator, Civil Service (Medical): No    Lack of Transportation (Non-Medical): No  Physical Activity: Insufficiently Active (08/13/2023)   Exercise Vital Sign    Days of Exercise per Week: 3 days    Minutes of Exercise per Session: 20 min  Stress: No Stress Concern Present (08/13/2023)   [employer] of Occupational Health - Occupational Stress Questionnaire    Feeling of Stress : Only a little  Social Connections: Moderately Integrated (08/19/2023)   Social Connection and Isolation Panel [NHANES]    Frequency of Communication with Friends and Family: More than three times a week    Frequency of Social Gatherings with Friends and Family: Once a week    Attends Religious Services: More than 4 times per year    Active Member of [employer] or Organizations: No    Attends Engineer, structural: Never    Marital Status: Married    Current Medications:  Current Outpatient Medications:    albuterol  (VENTOLIN  HFA) 108 (90 Base) MCG/ACT inhaler, Inhale 2 puffs into the lungs every 4 (four) hours as needed for wheezing or shortness of breath., Disp: 1 each, Rfl: 11   allopurinol  (ZYLOPRIM ) 100 MG tablet, Take 100 mg by mouth daily., Disp: , Rfl:    ANUCORT-HC  25 MG suppository, Place 25 mg rectally daily., Disp: , Rfl:    apixaban  (ELIQUIS ) 5 MG TABS tablet, Take 1 tablet (5 mg total) by mouth 2 (two) times daily., Disp: 180 tablet, Rfl: 3   ARIPiprazole  (ABILIFY ) 2 MG tablet, Take 2 mg by mouth at bedtime., Disp: , Rfl:    clotrimazole  (MYCELEX ) 10 MG troche, Take 10 mg by mouth in the morning and at bedtime., Disp: , Rfl:    clotrimazole  (QC CLOTRIMAZOLE ) 1 % vaginal cream, Place 1 Applicatorful vaginally at bedtime.,  Disp: 45 g, Rfl: 0   Continuous Glucose Sensor (DEXCOM G7 SENSOR) MISC, CHANGE SENSOR EVERY 10 DAYS, Disp: 9 each, Rfl: 3   cyanocobalamin  (VITAMIN B12) 1000 MCG tablet, Take 1 tablet (1,000 mcg total) by mouth daily., Disp: 30 tablet, Rfl: 1   denosumab  (PROLIA ) 60 MG/ML SOSY injection, Inject 60 mg into the skin every 6 (six) months., Disp: 1 mL, Rfl: 1   escitalopram  (LEXAPRO ) 10 MG tablet, Take 10 mg by mouth daily., Disp: , Rfl:  fluticasone -salmeterol (ADVAIR DISKUS) 100-50 MCG/ACT AEPB, Inhale 1 puff into the lungs 2 (two) times daily. (Patient taking differently: Inhale 1 puff into the lungs 2 (two) times daily as needed (for flares).), Disp: 1 each, Rfl: 5   furosemide  (LASIX ) 40 MG tablet, Take 40 mg by mouth 2 (two) times daily., Disp: , Rfl:    hydrOXYzine  (VISTARIL ) 25 MG capsule, Take 1 capsule (25 mg total) by mouth 3 (three) times daily., Disp: 30 capsule, Rfl: 5   Insulin  Disposable Pump (OMNIPOD 5 G7 PODS, GEN 5,) MISC, 1 Device by Does not apply route every other day., Disp: 45 each, Rfl: 3   insulin  glargine (LANTUS  SOLOSTAR) 100 UNIT/ML Solostar Pen, Inject 30 Units into the skin 2 (two) times daily., Disp: 60 mL, Rfl: 4   insulin  lispro (HUMALOG  KWIKPEN) 100 UNIT/ML KwikPen, Max daily 45 units (Patient taking differently: Inject 6-45 Units into the skin 3 (three) times daily. Max daily 45 units), Disp: 45 mL, Rfl: 6   Insulin  Pen Needle 29G X MISC, 1 Device by Does not apply route daily in the afternoon., Disp: 400 each, Rfl: 3   levocetirizine (XYZAL  ALLERGY 24HR) 5 MG tablet, Take 1 tablet (5 mg total) by mouth every evening. For itch, Disp: , Rfl:    lidocaine  (XYLOCAINE ) 2 % solution, Use as directed 15 mLs in the mouth or throat every 4 (four) hours as needed for mouth pain., Disp: 100 mL, Rfl: 5   LORazepam  (ATIVAN ) 0.5 MG tablet, Take 0.5-1 mg by mouth See admin instructions. Take 0.5 mg by mouth in the morning and 1 mg at bedtime, Disp: , Rfl:    losartan  (COZAAR )  25 MG tablet, Take 25 mg by mouth daily., Disp: , Rfl:    MAGNESIUM  PO, Take 1 tablet by mouth daily., Disp: , Rfl:    meclizine  (ANTIVERT ) 25 MG tablet, Take 1 tablet (25 mg total) by mouth 3 (three) times daily as needed for dizziness., Disp: 60 tablet, Rfl: 1   metFORMIN  (GLUCOPHAGE -XR) 500 MG 24 hr tablet, Take 2 tablets (1,000 mg total) by mouth daily with breakfast., Disp: 180 tablet, Rfl: 3   Methocarbamol  1000 MG TABS, Take 1,000 mg by mouth every 6 (six) hours as needed., Disp: 120 tablet, Rfl: 5   metoprolol  tartrate (LOPRESSOR ) 50 MG tablet, Take 1 tablet (50 mg total) by mouth 2 (two) times daily., Disp: 180 tablet, Rfl: 2   moxifloxacin  (AVELOX ) 400 MG tablet, Take 1 tablet (400 mg total) by mouth daily., Disp: 10 tablet, Rfl: 0   naloxone  (NARCAN ) nasal spray 4 mg/0.1 mL, Place 1 spray into the nose daily as needed (for over dose)., Disp: , Rfl:    nystatin  (MYCOSTATIN /NYSTOP ) powder, Apply 1 Application topically as needed (rash)., Disp: 15 g, Rfl: 2   nystatin  ointment (MYCOSTATIN ), Apply 1 Application topically 2 (two) times daily as needed (for rashes)., Disp: 30 g, Rfl: 1   omeprazole (PRILOSEC) 40 MG capsule, Take 40 mg by mouth daily., Disp: , Rfl:    ondansetron  (ZOFRAN ) 4 MG tablet, Take 1 tablet (4 mg total) by mouth every 8 (eight) hours as needed for nausea or vomiting. For cancer related nausea, Disp: 90 tablet, Rfl: 5   OXcarbazepine  (TRILEPTAL ) 150 MG tablet, Take 1 tablet (150 mg total) by mouth 2 (two) times daily., Disp: 60 tablet, Rfl: 0   Oxycodone  HCl 20 MG TABS, Take 1 tablet (20 mg total) by mouth 2 (two) times daily as needed. Do Not Fill Before 10/05/23,  Disp: 60 tablet, Rfl: 0   predniSONE  (DELTASONE ) 10 MG tablet, Take 5 daily for 2 days followed by 4,3,2 and 1 for 2 days each., Disp: 30 tablet, Rfl: 0   pregabalin  (LYRICA ) 200 MG capsule, Take 200 mg by mouth 2 (two) times daily., Disp: , Rfl:    promethazine  (PHENERGAN ) 25 MG tablet, Take 0.5-1 tablets  (12.5-25 mg total) by mouth every 6 (six) hours as needed for refractory nausea / vomiting (cancer related nausea/vomiting)., Disp: 120 tablet, Rfl: 5   rOPINIRole  (REQUIP ) 1 MG tablet, Take 1 tablet (1 mg total) by mouth at bedtime. For leg cramps, Disp: 30 tablet, Rfl: 5   senna-docusate (SENOKOT-S) 8.6-50 MG tablet, Take 1 tablet by mouth at bedtime., Disp: 30 tablet, Rfl: 0   Suvorexant  (BELSOMRA ) 20 MG TABS, Take 1 tablet (20 mg total) by mouth at bedtime as needed. (Patient taking differently: Take 20 mg by mouth at bedtime as needed (for sleep).), Disp: 30 tablet, Rfl: 5   tirzepatide  (MOUNJARO ) 12.5 MG/0.5ML Pen, Inject 12.5 mg into the skin once a week., Disp: 6 mL, Rfl: 3   Vitamin D , Cholecalciferol , 25 MCG (1000 UT) TABS, Take 1,000 Units by mouth daily in the afternoon., Disp: , Rfl:    atorvastatin  (LIPITOR) 80 MG tablet, Take 1 tablet (80 mg total) by mouth daily. (Patient taking differently: Take 80 mg by mouth every evening.), Disp: 90 tablet, Rfl: 3   potassium chloride  (KLOR-CON ) 10 MEQ tablet, Take 1 tablet (10 mEq total) by mouth daily., Disp: 90 tablet, Rfl: 3   spironolactone  (ALDACTONE ) 25 MG tablet, Take 0.5 tablets (12.5 mg total) by mouth daily., Disp: 15 tablet, Rfl: 2 No current facility-administered medications for this visit.  Facility-Administered Medications Ordered in Other Visits:    bupivacaine -epinephrine  (MARCAINE  W/ EPI) 0.5% -1:200000 injection, , , Anesthesia Intra-op, Willian Harrow, MD, 12 mL at 01/21/19 0935  Review of Symptoms: Complete 10-system review is positive for: Urinary frequency, headache, wheezing, pelvic pain, back pain, mouth sores, abdominal pain, muscle cramping, dizziness, blood in stool, pain with urination, hot flashes, itching, problem walking  Physical Exam: BP (!) 136/94 (BP Location: Left Arm, Patient Position: Sitting)   Pulse 83   Temp 97.9 F (36.6 C) (Oral)   Resp 18   Ht 5\' 1"  (1.549 m)   Wt 178 lb (80.7 kg)   SpO2 100%    BMI 33.63 kg/m  General: Alert, oriented, no acute distress. HEENT: Normocephalic, atraumatic. Neck symmetric without masses. Sclera anicteric.  Chest: Slight increased work of breathing with long sentences. Cough. Clear to auscultation bilaterally anteriorly. Cardiovascular: Regular rate and rhythm, no murmurs. Abdomen: Soft, diffuse tenderness to palpation. No guarding.  Normoactive bowel sounds.  Extremities: in wheelchair Skin: No rashes or lesions noted. Lymphatics: No cervical, supraclavicular, or inguinal adenopathy. GU: Normal appearing external genitalia without erythema, excoriation, or lesions.  Speculum exam reveals normal vaginal mucosa, flush normal appearing cervix.  Bimanual exam reveals normal cervix, difficult to palpate uterus due to body habitus and abdominal tenderness.  Exam chaperoned by Vira Grieves, NP     Laboratory & Radiologic Studies: MR Pelvis W Wo Contrast Jul 21, 2023  Narrative CLINICAL DATA:  Endometrial cancer staging, metastatic disease evaluation  EXAM: MRI PELVIS WITHOUT AND WITH CONTRAST  TECHNIQUE: Multiplanar multisequence MR imaging of the pelvis was performed both before and after administration of intravenous contrast.  CONTRAST:  8mL GADAVIST  GADOBUTROL  1 MMOL/ML IV SOLN  COMPARISON:  01/26/2023  FINDINGS: Urinary Tract:  No abnormality visualized.  Bowel:  Sigmoid diverticulosis.  Vascular/Lymphatic: No pathologically enlarged lymph nodes. No significant vascular abnormality seen.  Reproductive: No residual endometrial mass or other abnormality of the uterus (series 6, image 19). Normal postmenopausal appearance of the uterus without significant endometrial stripe thickness. Normal postmenopausal ovaries.  Other:  None.  Musculoskeletal: No suspicious bone lesions identified.  IMPRESSION: 1. No residual endometrial mass or other abnormality of the uterus. 2. No evidence of lymphadenopathy or metastatic disease in  the pelvis. 3. Sigmoid diverticulosis.   Electronically Signed By: Fredricka Jenny M.D. On: 07/08/2023 09:27

## 2023-10-05 NOTE — Patient Instructions (Signed)
 It was a pleasure to see you in clinic today. - No bleeding from vaginal source on exam. - We have you ordered for a ct scan - Return visit planned for 3 months with [name] and 6 months with Dr. Daisey Dryer.  Thank you very much for allowing me to provide care for you today.  I appreciate your confidence in choosing our Gynecologic Oncology team at [facility].  If you have any questions about your visit today please call our office or send us  a MyChart message and we will get back to you as soon as possible.

## 2023-10-06 ENCOUNTER — Ambulatory Visit (INDEPENDENT_AMBULATORY_CARE_PROVIDER_SITE_OTHER)

## 2023-10-06 ENCOUNTER — Ambulatory Visit (INDEPENDENT_AMBULATORY_CARE_PROVIDER_SITE_OTHER): Admitting: Family Medicine

## 2023-10-06 ENCOUNTER — Encounter: Payer: Self-pay | Admitting: Family Medicine

## 2023-10-06 ENCOUNTER — Ambulatory Visit (HOSPITAL_COMMUNITY)
Admission: RE | Admit: 2023-10-06 | Discharge: 2023-10-06 | Discharge status: Home / Self Care | Source: Ambulatory Visit | Attending: Psychiatry | Admitting: Psychiatry

## 2023-10-06 ENCOUNTER — Ambulatory Visit: Payer: Self-pay

## 2023-10-06 VITALS — BP 116/81 | HR 93 | Temp 97.6°F | Ht 61.0 in | Wt 176.0 lb

## 2023-10-06 DIAGNOSIS — R059 Cough, unspecified: Secondary | ICD-10-CM | POA: Diagnosis not present

## 2023-10-06 DIAGNOSIS — C541 Malignant neoplasm of endometrium: Secondary | ICD-10-CM | POA: Insufficient documentation

## 2023-10-06 DIAGNOSIS — E1165 Type 2 diabetes mellitus with hyperglycemia: Secondary | ICD-10-CM

## 2023-10-06 DIAGNOSIS — G43109 Migraine with aura, not intractable, without status migrainosus: Secondary | ICD-10-CM

## 2023-10-06 DIAGNOSIS — R053 Chronic cough: Secondary | ICD-10-CM | POA: Diagnosis not present

## 2023-10-06 DIAGNOSIS — Z794 Long term (current) use of insulin: Secondary | ICD-10-CM | POA: Diagnosis not present

## 2023-10-06 DIAGNOSIS — J9611 Chronic respiratory failure with hypoxia: Secondary | ICD-10-CM | POA: Diagnosis not present

## 2023-10-06 DIAGNOSIS — I517 Cardiomegaly: Secondary | ICD-10-CM | POA: Diagnosis not present

## 2023-10-06 MED ORDER — CLARITHROMYCIN 500 MG PO TABS
500.0000 mg | ORAL_TABLET | Freq: Two times a day (BID) | ORAL | 0 refills | Status: DC
Start: 2023-10-06 — End: 2023-10-10

## 2023-10-06 MED ORDER — FLUTICASONE-SALMETEROL 500-50 MCG/ACT IN AEPB: 1.0000 | INHALATION_SPRAY | Freq: Two times a day (BID) | RESPIRATORY_TRACT | 5 refills | Status: DC

## 2023-10-06 MED ORDER — FLUCONAZOLE 150 MG PO TABS: 150.0000 mg | ORAL_TABLET | Freq: Every day | ORAL | 0 refills | Status: DC

## 2023-10-06 NOTE — Telephone Encounter (Signed)
 Patient called in stating she was seen today in office and is calling about her lab results and medication. This RN did not see results and medication. Please advise.   Copied from CRM [id number]. Topic: Clinical - Red Word Triage >> Oct 06, 2023  3:55 PM Carrielelia G wrote: Red Word that prompted transfer to Nurse Triage:sob, ear popping. Cough, sores in mouth

## 2023-10-06 NOTE — Progress Notes (Signed)
 Subjective:  Patient ID: Amber Stephenson, female    DOB: [birth year]  Age: 78 y.o. MRN: [id number]  CC: Cough (Cough as gotten worse. All the symptoms have gotten including SOB and wheezing. Finished abx. ) and Ear Pain (Both ears are bothering her. Ongoign since last week but pain is worse. )   HPI Amber Stephenson presents for mouth pain, severe HA at left side of head, ears pop, cough worse. Dry. Blowing nose green. Nyquil helps cough. Not sleeping at all. Feeling miserable. Dyspnea after a cough paroxysm. Subtle states patient. Left temporal pain tingles, really intense. 9/10 for pain severity. Intermittent lasting 4 minutes. Occurring about 24 times a day.      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), Benign paroxysmal positional vertigo due to bilateral vestibular disorder (05/20/2019), Bipolar 1 disorder (HCC) (01/23/2015), CAD (coronary artery disease), Cardiomegaly (01/12/2018), CHF (congestive heart failure) (06/08/2022), Chronic back pain (01/04/2015), Chronic constipation (04/25/2020), Chronic diastolic heart failure (HCC) (18/84/1660), Chronic pain syndrome (08/22/2019), Chronic post-traumatic stress disorder (PTSD) (12/06/2020), Diabetic neuropathy (HCC) (02/06/2016), Dyslipidemia (09/24/2020), Essential hypertension, Functional diarrhea (10/26/2020), Herpes genitalis in women (07/16/2015), History of adenomatous polyp of colon (05/21/2019), History of radiation therapy, Insomnia (01/23/2015), Migraine headache with aura (02/12/2016), Myofascial pain dysfunction syndrome  (08/22/2019), Non-alcoholic fatty liver disease (63/06/6008), OSA (obstructive sleep apnea) (02/24/2019), Osteopenia (12/06/2020), Pinguecula (12/06/2020), Presbyopia (12/06/2020), Pulmonary hypertension, RLS (restless legs syndrome) (04/27/2015), Tremor, essential (12/11/2021), and Type II diabetes mellitus (HCC) (09/24/2020).   She has a past surgical history that includes THIGH SURGERY; Shoulder surgery (Right); Breast reduction surgery; Eye surgery (Right); Hammer toe surgery; LEFT HEART CATH AND CORONARY ANGIOGRAPHY (N/A, 02/03/2018); and Reverse shoulder arthroplasty (Right, 08/19/2019).   Her family history includes Alcohol  abuse in her sister; Alzheimer's disease in her father; Diabetes in her brother, mother, and sister; Heart disease in her brother, mother, and sister; Mental illness in her brother; Stroke in her brother.She reports that she has never smoked. She has never used smokeless tobacco. She reports that she does not currently use alcohol . She reports that she does not use drugs.    ROS Review of Systems  Constitutional: Negative.   HENT:  Positive for mouth sores (last month the treatment took care of them.).   Eyes:  Negative for visual disturbance.  Respiratory:  Positive for cough and shortness of breath.   Cardiovascular:  Negative for chest pain.  Gastrointestinal:  Negative for abdominal pain.  Musculoskeletal:  Negative for arthralgias.  Neurological:  Positive for headaches.    Objective:  BP 116/81   Pulse 93   Temp 97.6 F (36.4 C)   Ht 5\' 1"  (1.549 m)   Wt 176 lb (79.8 kg)   SpO2 94%   BMI 33.25 kg/m   BP Readings from Last 3 Encounters:  10/06/23 116/81  10/05/23 (!) 136/94  09/29/23 (!) 145/80    Wt Readings from Last 3 Encounters:  10/06/23 176 lb (79.8 kg)  10/05/23 178 lb (80.7 kg)  09/29/23 188 lb (85.3 kg)     Physical Exam Constitutional:  General: She is not in acute distress.    Appearance: She is well-developed.  HENT:      Head: Normocephalic and atraumatic.  Eyes:     Conjunctiva/sclera: Conjunctivae normal.     Pupils: Pupils are equal, round, and reactive to light.  Neck:     Thyroid : No thyromegaly.  Cardiovascular:     Rate and Rhythm: Normal rate and regular rhythm.     Heart sounds: Normal heart sounds. No murmur heard. Pulmonary:     Effort: Pulmonary effort is normal. No respiratory distress.     Breath sounds: Wheezing (few) present. No rales.  Abdominal:     General: Bowel sounds are normal. There is no distension.     Palpations: Abdomen is soft.     Tenderness: There is no abdominal tenderness.  Musculoskeletal:        General: Normal range of motion.     Cervical back: Normal range of motion and neck supple.  Lymphadenopathy:     Cervical: No cervical adenopathy.  Skin:    General: Skin is warm and dry.  Neurological:     Mental Status: She is alert and oriented to person, place, and time.  Psychiatric:        Behavior: Behavior normal.        Thought Content: Thought content normal.        Judgment: Judgment normal.    CXR - no acute infiltrate. There is cardiomegaly. No progression from one week ago on comparison  Assessment & Plan:  Persistent cough -     DG Chest 2 View; Future  Type 2 diabetes mellitus with hyperglycemia, with long-term current use of insulin  (HCC)  Chronic respiratory failure with hypoxia (HCC)  Migraine with aura and without status migrainosus, not intractable  Other orders -     Clarithromycin; Take 1 tablet (500 mg total) by mouth 2 (two) times daily.  Dispense: 20 tablet; Refill: 0 -     Fluticasone -Salmeterol; Inhale 1 puff into the lungs in the morning and at bedtime.  Dispense: 60 each; Refill: 5 -     Fluconazole ; Take 1 tablet (150 mg total) by mouth daily for 5 doses.  Dispense: 5 tablet; Refill: 0     Follow-up: Return if symptoms worsen or fail to improve.  Amber Stephenson, M.D.

## 2023-10-07 ENCOUNTER — Inpatient Hospital Stay (HOSPITAL_COMMUNITY)
Admission: EM | Admit: 2023-10-07 | Discharge: 2023-10-10 | DRG: 378 | Disposition: A | Discharge status: Home / Self Care | Attending: Internal Medicine | Admitting: Internal Medicine

## 2023-10-07 ENCOUNTER — Emergency Department (HOSPITAL_COMMUNITY)

## 2023-10-07 ENCOUNTER — Other Ambulatory Visit: Payer: Self-pay

## 2023-10-07 DIAGNOSIS — Z794 Long term (current) use of insulin: Secondary | ICD-10-CM

## 2023-10-07 DIAGNOSIS — E66811 Obesity, class 1: Secondary | ICD-10-CM | POA: Diagnosis present

## 2023-10-07 DIAGNOSIS — Z888 Allergy status to other drugs, medicaments and biological substances status: Secondary | ICD-10-CM

## 2023-10-07 DIAGNOSIS — M8580 Other specified disorders of bone density and structure, unspecified site: Secondary | ICD-10-CM | POA: Diagnosis present

## 2023-10-07 DIAGNOSIS — Z91048 Other nonmedicinal substance allergy status: Secondary | ICD-10-CM

## 2023-10-07 DIAGNOSIS — K760 Fatty (change of) liver, not elsewhere classified: Secondary | ICD-10-CM | POA: Diagnosis present

## 2023-10-07 DIAGNOSIS — G4733 Obstructive sleep apnea (adult) (pediatric): Secondary | ICD-10-CM | POA: Diagnosis present

## 2023-10-07 DIAGNOSIS — G894 Chronic pain syndrome: Secondary | ICD-10-CM | POA: Diagnosis present

## 2023-10-07 DIAGNOSIS — K625 Hemorrhage of anus and rectum: Secondary | ICD-10-CM | POA: Diagnosis present

## 2023-10-07 DIAGNOSIS — E785 Hyperlipidemia, unspecified: Secondary | ICD-10-CM | POA: Diagnosis present

## 2023-10-07 DIAGNOSIS — K5790 Diverticulosis of intestine, part unspecified, without perforation or abscess without bleeding: Secondary | ICD-10-CM

## 2023-10-07 DIAGNOSIS — Z833 Family history of diabetes mellitus: Secondary | ICD-10-CM

## 2023-10-07 DIAGNOSIS — Z7984 Long term (current) use of oral hypoglycemic drugs: Secondary | ICD-10-CM

## 2023-10-07 DIAGNOSIS — Z923 Personal history of irradiation: Secondary | ICD-10-CM

## 2023-10-07 DIAGNOSIS — I4891 Unspecified atrial fibrillation: Secondary | ICD-10-CM | POA: Diagnosis not present

## 2023-10-07 DIAGNOSIS — Z7951 Long term (current) use of inhaled steroids: Secondary | ICD-10-CM

## 2023-10-07 DIAGNOSIS — K649 Unspecified hemorrhoids: Secondary | ICD-10-CM | POA: Diagnosis present

## 2023-10-07 DIAGNOSIS — Z96611 Presence of right artificial shoulder joint: Secondary | ICD-10-CM | POA: Diagnosis present

## 2023-10-07 DIAGNOSIS — I110 Hypertensive heart disease with heart failure: Secondary | ICD-10-CM | POA: Diagnosis present

## 2023-10-07 DIAGNOSIS — K922 Gastrointestinal hemorrhage, unspecified: Secondary | ICD-10-CM | POA: Diagnosis not present

## 2023-10-07 DIAGNOSIS — R059 Cough, unspecified: Secondary | ICD-10-CM | POA: Diagnosis not present

## 2023-10-07 DIAGNOSIS — Z818 Family history of other mental and behavioral disorders: Secondary | ICD-10-CM

## 2023-10-07 DIAGNOSIS — I2510 Atherosclerotic heart disease of native coronary artery without angina pectoris: Secondary | ICD-10-CM | POA: Diagnosis present

## 2023-10-07 DIAGNOSIS — Z7983 Long term (current) use of bisphosphonates: Secondary | ICD-10-CM

## 2023-10-07 DIAGNOSIS — Z6832 Body mass index (BMI) 32.0-32.9, adult: Secondary | ICD-10-CM

## 2023-10-07 DIAGNOSIS — E1165 Type 2 diabetes mellitus with hyperglycemia: Secondary | ICD-10-CM

## 2023-10-07 DIAGNOSIS — E871 Hypo-osmolality and hyponatremia: Secondary | ICD-10-CM | POA: Diagnosis present

## 2023-10-07 DIAGNOSIS — I959 Hypotension, unspecified: Secondary | ICD-10-CM | POA: Diagnosis present

## 2023-10-07 DIAGNOSIS — I5032 Chronic diastolic (congestive) heart failure: Secondary | ICD-10-CM | POA: Diagnosis present

## 2023-10-07 DIAGNOSIS — K5733 Diverticulitis of large intestine without perforation or abscess with bleeding: Principal | ICD-10-CM | POA: Diagnosis present

## 2023-10-07 DIAGNOSIS — Z823 Family history of stroke: Secondary | ICD-10-CM

## 2023-10-07 DIAGNOSIS — Z7901 Long term (current) use of anticoagulants: Secondary | ICD-10-CM

## 2023-10-07 DIAGNOSIS — Z820 Family history of epilepsy and other diseases of the nervous system: Secondary | ICD-10-CM

## 2023-10-07 DIAGNOSIS — E1140 Type 2 diabetes mellitus with diabetic neuropathy, unspecified: Secondary | ICD-10-CM | POA: Diagnosis present

## 2023-10-07 DIAGNOSIS — G2581 Restless legs syndrome: Secondary | ICD-10-CM | POA: Diagnosis present

## 2023-10-07 DIAGNOSIS — Z79899 Other long term (current) drug therapy: Secondary | ICD-10-CM

## 2023-10-07 DIAGNOSIS — R1084 Generalized abdominal pain: Secondary | ICD-10-CM

## 2023-10-07 DIAGNOSIS — Z811 Family history of alcohol abuse and dependence: Secondary | ICD-10-CM

## 2023-10-07 DIAGNOSIS — I272 Pulmonary hypertension, unspecified: Secondary | ICD-10-CM | POA: Diagnosis present

## 2023-10-07 DIAGNOSIS — F319 Bipolar disorder, unspecified: Secondary | ICD-10-CM | POA: Diagnosis present

## 2023-10-07 DIAGNOSIS — Z91040 Latex allergy status: Secondary | ICD-10-CM

## 2023-10-07 DIAGNOSIS — J9611 Chronic respiratory failure with hypoxia: Secondary | ICD-10-CM | POA: Diagnosis present

## 2023-10-07 DIAGNOSIS — Z91041 Radiographic dye allergy status: Secondary | ICD-10-CM

## 2023-10-07 DIAGNOSIS — I4819 Other persistent atrial fibrillation: Secondary | ICD-10-CM | POA: Diagnosis present

## 2023-10-07 DIAGNOSIS — C541 Malignant neoplasm of endometrium: Secondary | ICD-10-CM | POA: Diagnosis present

## 2023-10-07 DIAGNOSIS — Z7985 Long-term (current) use of injectable non-insulin antidiabetic drugs: Secondary | ICD-10-CM

## 2023-10-07 DIAGNOSIS — Z860101 Personal history of adenomatous and serrated colon polyps: Secondary | ICD-10-CM

## 2023-10-07 DIAGNOSIS — D62 Acute posthemorrhagic anemia: Secondary | ICD-10-CM | POA: Diagnosis present

## 2023-10-07 DIAGNOSIS — Z8249 Family history of ischemic heart disease and other diseases of the circulatory system: Secondary | ICD-10-CM

## 2023-10-07 DIAGNOSIS — F419 Anxiety disorder, unspecified: Secondary | ICD-10-CM | POA: Diagnosis present

## 2023-10-07 LAB — URINALYSIS, ROUTINE W REFLEX MICROSCOPIC
Bacteria, UA: NONE SEEN
Bilirubin Urine: NEGATIVE
Glucose, UA: NEGATIVE mg/dL
Ketones, ur: NEGATIVE mg/dL
Leukocytes,Ua: NEGATIVE
Nitrite: NEGATIVE
Protein, ur: NEGATIVE mg/dL
Specific Gravity, Urine: 1.005 (ref 1.005–1.030)
pH: 7 (ref 5.0–8.0)

## 2023-10-07 LAB — COMPREHENSIVE METABOLIC PANEL WITH GFR
ALT: 17 U/L (ref 0–44)
ALT: 16 U/L (ref 0–44)
AST: 24 U/L (ref 15–41)
AST: 19 U/L (ref 15–41)
Albumin: 3.6 g/dL (ref 3.5–5.0)
Albumin: 4.1 g/dL (ref 3.5–5.0)
Alkaline Phosphatase: 67 U/L (ref 38–126)
Alkaline Phosphatase: 66 U/L (ref 38–126)
Anion gap: 13 (ref 5–15)
Anion gap: 13 (ref 5–15)
BUN: 31 mg/dL — ABNORMAL HIGH (ref 8–23)
BUN: 29 mg/dL — ABNORMAL HIGH (ref 8–23)
CO2: 25 mmol/L (ref 22–32)
CO2: 28 mmol/L (ref 22–32)
Calcium: 9.6 mg/dL (ref 8.9–10.3)
Calcium: 9.6 mg/dL (ref 8.9–10.3)
Chloride: 92 mmol/L — ABNORMAL LOW (ref 98–111)
Chloride: 95 mmol/L — ABNORMAL LOW (ref 98–111)
Creatinine, Ser: 0.96 mg/dL (ref 0.44–1.00)
Creatinine, Ser: 0.89 mg/dL (ref 0.44–1.00)
GFR, Estimated: >60 mL/min (ref >60)
GFR, Estimated: >60 mL/min (ref >60)
Glucose, Bld: 175 mg/dL — ABNORMAL HIGH (ref 70–99)
Glucose, Bld: 103 mg/dL — ABNORMAL HIGH (ref 70–99)
Potassium: 3.8 mmol/L (ref 3.5–5.1)
Potassium: 3.8 mmol/L (ref 3.5–5.1)
Sodium: 130 mmol/L — ABNORMAL LOW (ref 135–145)
Sodium: 136 mmol/L (ref 135–145)
Total Bilirubin: 0.8 mg/dL (ref 0.0–1.2)
Total Bilirubin: 0.9 mg/dL (ref 0.0–1.2)
Total Protein: 6.9 g/dL (ref 6.5–8.1)
Total Protein: 7.5 g/dL (ref 6.5–8.1)

## 2023-10-07 LAB — CBC WITH DIFFERENTIAL/PLATELET
Abs Immature Granulocytes: 0.28 10*3/uL — ABNORMAL HIGH (ref 0.00–0.07)
Abs Immature Granulocytes: 0.36 10*3/uL — ABNORMAL HIGH (ref 0.00–0.07)
Basophils Absolute: 0.1 10*3/uL (ref 0.0–0.1)
Basophils Absolute: 0.1 10*3/uL (ref 0.0–0.1)
Basophils Relative: 1 %
Basophils Relative: 1 %
Eosinophils Absolute: 0.1 10*3/uL (ref 0.0–0.5)
Eosinophils Absolute: 0.1 10*3/uL (ref 0.0–0.5)
Eosinophils Relative: 1 %
Eosinophils Relative: 1 %
HCT: 36.5 % (ref 36.0–46.0)
HCT: 43.2 % (ref 36.0–46.0)
Hemoglobin: 12.1 g/dL (ref 12.0–15.0)
Hemoglobin: 14.1 g/dL (ref 12.0–15.0)
Immature Granulocytes: 3 %
Immature Granulocytes: 4 %
Lymphocytes Relative: 12 %
Lymphocytes Relative: 11 %
Lymphs Abs: 1 10*3/uL (ref 0.7–4.0)
Lymphs Abs: 1.1 10*3/uL (ref 0.7–4.0)
MCH: 30.4 pg (ref 26.0–34.0)
MCH: 29.9 pg (ref 26.0–34.0)
MCHC: 33.2 g/dL (ref 30.0–36.0)
MCHC: 32.6 g/dL (ref 30.0–36.0)
MCV: 91.7 fL (ref 80.0–100.0)
MCV: 91.7 fL (ref 80.0–100.0)
Monocytes Absolute: 0.8 10*3/uL (ref 0.1–1.0)
Monocytes Absolute: 0.8 10*3/uL (ref 0.1–1.0)
Monocytes Relative: 9 %
Monocytes Relative: 8 %
Neutro Abs: 6.5 10*3/uL (ref 1.7–7.7)
Neutro Abs: 7.6 10*3/uL (ref 1.7–7.7)
Neutrophils Relative %: 74 %
Neutrophils Relative %: 75 %
Platelets: 227 10*3/uL (ref 150–400)
Platelets: 211 10*3/uL (ref 150–400)
RBC: 3.98 MIL/uL (ref 3.87–5.11)
RBC: 4.71 MIL/uL (ref 3.87–5.11)
RDW: 14.4 % (ref 11.5–15.5)
RDW: 14.5 % (ref 11.5–15.5)
WBC: 8.7 10*3/uL (ref 4.0–10.5)
WBC: 10 10*3/uL (ref 4.0–10.5)
nRBC: 0 % (ref 0.0–0.2)
nRBC: 0 % (ref 0.0–0.2)

## 2023-10-07 LAB — APTT: aPTT: 27 s (ref 24–36)

## 2023-10-07 LAB — TROPONIN I (HIGH SENSITIVITY): Troponin I (High Sensitivity): 14 ng/L (ref <18)

## 2023-10-07 LAB — GLUCOSE, CAPILLARY
Glucose-Capillary: 103 mg/dL — ABNORMAL HIGH (ref 70–99)
Glucose-Capillary: 226 mg/dL — ABNORMAL HIGH (ref 70–99)

## 2023-10-07 LAB — PROTIME-INR
INR: 1.4 — ABNORMAL HIGH (ref 0.8–1.2)
INR: 1.3 — ABNORMAL HIGH (ref 0.8–1.2)
Prothrombin Time: 17.5 s — ABNORMAL HIGH (ref 11.4–15.2)
Prothrombin Time: 15.9 s — ABNORMAL HIGH (ref 11.4–15.2)

## 2023-10-07 LAB — LACTIC ACID, PLASMA: Lactic Acid, Venous: 1.6 mmol/L (ref 0.5–1.9)

## 2023-10-07 LAB — HEMOGLOBIN AND HEMATOCRIT, BLOOD
HCT: 34.2 % — ABNORMAL LOW (ref 36.0–46.0)
Hemoglobin: 11.3 g/dL — ABNORMAL LOW (ref 12.0–15.0)

## 2023-10-07 LAB — TYPE AND SCREEN
ABO/RH(D): O POS
Antibody Screen: NEGATIVE

## 2023-10-07 LAB — POC OCCULT BLOOD, ED: Fecal Occult Bld: POSITIVE — AB

## 2023-10-07 LAB — LIPASE, BLOOD: Lipase: 46 U/L (ref 11–51)

## 2023-10-07 MED ORDER — HYDROMORPHONE HCL 1 MG/ML IJ SOLN
1.0000 mg | Freq: Once | INTRAMUSCULAR | Status: AC
Start: 2023-10-07 — End: 2023-10-07
  Administered 2023-10-07: 1 mg via INTRAVENOUS
  Filled 2023-10-07: qty 1

## 2023-10-07 MED ORDER — PANTOPRAZOLE SODIUM 40 MG IV SOLR
40.0000 mg | Freq: Once | INTRAVENOUS | Status: AC
  Administered 2023-10-07: 40 mg via INTRAVENOUS
  Filled 2023-10-07: qty 10

## 2023-10-07 MED ORDER — SODIUM CHLORIDE 0.9 % IV BOLUS
500.0000 mL | Freq: Once | INTRAVENOUS | Status: AC
  Administered 2023-10-07: 500 mL via INTRAVENOUS

## 2023-10-07 MED ORDER — ONDANSETRON HCL 4 MG/2ML IJ SOLN
4.0000 mg | Freq: Once | INTRAMUSCULAR | Status: AC
Start: 2023-10-07 — End: 2023-10-07
  Administered 2023-10-07: 4 mg via INTRAVENOUS
  Filled 2023-10-07: qty 2

## 2023-10-07 MED ORDER — ACETAMINOPHEN 325 MG PO TABS
650.0000 mg | ORAL_TABLET | Freq: Four times a day (QID) | ORAL | Status: DC | PRN
  Administered 2023-10-10: 650 mg via ORAL
  Filled 2023-10-07: qty 2

## 2023-10-07 MED ORDER — LORAZEPAM 1 MG PO TABS
1.0000 mg | ORAL_TABLET | Freq: Every day | ORAL | Status: DC
  Administered 2023-10-07 – 2023-10-09 (×2): 1 mg via ORAL
  Filled 2023-10-07 (×2): qty 1

## 2023-10-07 MED ORDER — INSULIN GLARGINE-YFGN 100 UNIT/ML ~~LOC~~ SOLN
7.0000 [IU] | Freq: Two times a day (BID) | SUBCUTANEOUS | Status: DC
  Filled 2023-10-07 (×2): qty 0.07

## 2023-10-07 MED ORDER — METOPROLOL TARTRATE 5 MG/5ML IV SOLN: 5.0000 mg | Freq: Four times a day (QID) | INTRAVENOUS | Status: DC | PRN

## 2023-10-07 MED ORDER — PANTOPRAZOLE SODIUM 40 MG IV SOLR
40.0000 mg | Freq: Two times a day (BID) | INTRAVENOUS | Status: DC
  Administered 2023-10-07 – 2023-10-10 (×6): 40 mg via INTRAVENOUS
  Filled 2023-10-07 (×6): qty 10

## 2023-10-07 MED ORDER — SODIUM CHLORIDE 0.9 % IV SOLN: INTRAVENOUS | Status: DC

## 2023-10-07 MED ORDER — LEVOCETIRIZINE DIHYDROCHLORIDE 5 MG PO TABS: 5.0000 mg | ORAL_TABLET | Freq: Every evening | ORAL | Status: DC

## 2023-10-07 MED ORDER — OXYCODONE HCL 5 MG PO TABS
20.0000 mg | ORAL_TABLET | Freq: Two times a day (BID) | ORAL | Status: DC | PRN
  Administered 2023-10-07: 20 mg via ORAL
  Filled 2023-10-07: qty 4

## 2023-10-07 MED ORDER — METOPROLOL TARTRATE 50 MG PO TABS
50.0000 mg | ORAL_TABLET | Freq: Two times a day (BID) | ORAL | Status: DC
  Administered 2023-10-07 – 2023-10-10 (×6): 50 mg via ORAL
  Filled 2023-10-07 (×6): qty 1

## 2023-10-07 MED ORDER — OXYCODONE HCL 5 MG PO TABS: 10.0000 mg | ORAL_TABLET | Freq: Three times a day (TID) | ORAL | Status: DC | PRN

## 2023-10-07 MED ORDER — INSULIN ASPART 100 UNIT/ML IJ SOLN
0.0000 [IU] | INTRAMUSCULAR | Status: DC
  Administered 2023-10-07: 5 [IU] via SUBCUTANEOUS
  Administered 2023-10-08: 2 [IU] via SUBCUTANEOUS
  Administered 2023-10-08 – 2023-10-09 (×3): 3 [IU] via SUBCUTANEOUS
  Administered 2023-10-09: 5 [IU] via SUBCUTANEOUS
  Administered 2023-10-10 (×3): 3 [IU] via SUBCUTANEOUS

## 2023-10-07 MED ORDER — METOPROLOL TARTRATE 5 MG/5ML IV SOLN
5.0000 mg | INTRAVENOUS | Status: DC | PRN
  Filled 2023-10-07: qty 5

## 2023-10-07 MED ORDER — PREGABALIN 75 MG PO CAPS
200.0000 mg | ORAL_CAPSULE | Freq: Two times a day (BID) | ORAL | Status: DC
  Administered 2023-10-07 – 2023-10-10 (×5): 200 mg via ORAL
  Filled 2023-10-07 (×6): qty 2

## 2023-10-07 MED ORDER — METHOCARBAMOL 500 MG PO TABS
1000.0000 mg | ORAL_TABLET | Freq: Four times a day (QID) | ORAL | Status: DC | PRN
  Administered 2023-10-09: 1000 mg via ORAL
  Filled 2023-10-07: qty 2

## 2023-10-07 MED ORDER — ALBUTEROL SULFATE (2.5 MG/3ML) 0.083% IN NEBU: 3.0000 mL | INHALATION_SOLUTION | RESPIRATORY_TRACT | Status: DC | PRN

## 2023-10-07 MED ORDER — SODIUM CHLORIDE 0.9 % IV BOLUS
1000.0000 mL | Freq: Once | INTRAVENOUS | Status: AC
  Administered 2023-10-07: 1000 mL via INTRAVENOUS

## 2023-10-07 MED ORDER — SODIUM CHLORIDE 0.9 % IV SOLN
1.0000 g | INTRAVENOUS | Status: DC
  Administered 2023-10-07: 1 g via INTRAVENOUS
  Filled 2023-10-07: qty 10

## 2023-10-07 MED ORDER — HYDROXYZINE HCL 25 MG PO TABS: 25.0000 mg | ORAL_TABLET | Freq: Four times a day (QID) | ORAL | Status: DC | PRN

## 2023-10-07 MED ORDER — ONDANSETRON HCL 4 MG/2ML IJ SOLN: 4.0000 mg | Freq: Four times a day (QID) | INTRAMUSCULAR | Status: DC | PRN

## 2023-10-07 MED ORDER — LACTATED RINGERS IV BOLUS
500.0000 mL | Freq: Once | INTRAVENOUS | Status: AC
Start: 2023-10-07 — End: 2023-10-07
  Administered 2023-10-07: 500 mL via INTRAVENOUS

## 2023-10-07 MED ORDER — LORAZEPAM 0.5 MG PO TABS
0.5000 mg | ORAL_TABLET | Freq: Every morning | ORAL | Status: DC
  Administered 2023-10-08 – 2023-10-10 (×3): 0.5 mg via ORAL
  Filled 2023-10-07 (×3): qty 1

## 2023-10-07 MED ORDER — INSULIN GLARGINE-YFGN 100 UNIT/ML ~~LOC~~ SOLN
12.0000 [IU] | Freq: Two times a day (BID) | SUBCUTANEOUS | Status: DC
  Administered 2023-10-07 – 2023-10-10 (×6): 12 [IU] via SUBCUTANEOUS
  Filled 2023-10-07 (×8): qty 0.12

## 2023-10-07 MED ORDER — METRONIDAZOLE 500 MG/100ML IV SOLN
500.0000 mg | Freq: Two times a day (BID) | INTRAVENOUS | Status: DC
  Administered 2023-10-07 – 2023-10-10 (×6): 500 mg via INTRAVENOUS
  Filled 2023-10-07 (×6): qty 100

## 2023-10-07 MED ORDER — ONDANSETRON HCL 4 MG PO TABS: 4.0000 mg | ORAL_TABLET | Freq: Four times a day (QID) | ORAL | Status: DC | PRN

## 2023-10-07 MED ORDER — OXCARBAZEPINE 150 MG PO TABS
150.0000 mg | ORAL_TABLET | Freq: Two times a day (BID) | ORAL | Status: DC
  Administered 2023-10-07 – 2023-10-10 (×5): 150 mg via ORAL
  Filled 2023-10-07 (×6): qty 1

## 2023-10-07 MED ORDER — LORATADINE 10 MG PO TABS: 10.0000 mg | ORAL_TABLET | Freq: Every evening | ORAL | Status: DC | PRN

## 2023-10-07 MED ORDER — LORAZEPAM 0.5 MG PO TABS: 0.5000 mg | ORAL_TABLET | ORAL | Status: DC

## 2023-10-07 MED ORDER — ACETAMINOPHEN 650 MG RE SUPP: 650.0000 mg | Freq: Four times a day (QID) | RECTAL | Status: DC | PRN

## 2023-10-07 MED ORDER — FENTANYL CITRATE PF 50 MCG/ML IJ SOSY
25.0000 ug | PREFILLED_SYRINGE | Freq: Once | INTRAMUSCULAR | Status: AC
  Administered 2023-10-07: 25 ug via INTRAVENOUS
  Filled 2023-10-07: qty 1

## 2023-10-07 MED ORDER — GUAIFENESIN-DM 100-10 MG/5ML PO SYRP: 10.0000 mL | ORAL_SOLUTION | ORAL | Status: DC | PRN

## 2023-10-07 MED ORDER — SODIUM CHLORIDE 0.9 % IV SOLN
3.0000 g | Freq: Once | INTRAVENOUS | Status: AC
  Administered 2023-10-07: 3 g via INTRAVENOUS
  Filled 2023-10-07: qty 8

## 2023-10-07 NOTE — Care Management Obs Status (Signed)
 MEDICARE OBSERVATION STATUS NOTIFICATION   Patient Details  Name: Amber Stephenson MRN: [id number] Date of Birth: Oct 13, 1945   Medicare Observation Status Notification Given:  Yes    Geraldina Klinefelter, RN 10/07/2023, 6:38 PM

## 2023-10-07 NOTE — Progress Notes (Signed)
   10/07/23 1905  TOC Brief Assessment  Insurance and Status Reviewed  Patient has primary care physician Yes  Home environment has been reviewed From home  Prior level of function: Minimal assist  Prior/Current Home Services No current home services  Social Drivers of Health Review SDOH reviewed no interventions necessary  Readmission risk has been reviewed Yes  Transition of care needs transition of care needs identified, TOC will continue to follow   Pt currently has home oxygen  c/[employer] in [facility]. Pt states [employer] notified her that they will be discontinuing home O2 this Friday. Monitor for O2 needs. TOC to follow.

## 2023-10-07 NOTE — H&P (Addendum)
 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 She was having a persistent cough and seen by PCP 5/6 AND placed on clarithromycin, also seen by GYN oncology recently and had CT abdomen pelvis obtained on 5/6> no acute process, incomplete evaluation of endometrial, no pathologically enlarged endocrinopathy.  In the ED, initial blood pressure 113> subsequently 99/49, tachycardic up to 126, tachypneic 24 Labs with hyponatremia 130 BUN 13 creatinine 0.9 troponin 48 lactic acid 1.6 hemoglobin 12.1, UA unremarkable, Hemoccult positive. CT abdomen pelvis w/o contrast>> sigmoid diverticulosis w/ minimal perisigmoid  haziness may be chronic or mild acute diverticulitis, no abscess, no hydronephrosis normal appendix Patient on Protonix , antiemetics and reselected 500 cc, Unasyn  and admission was requested BRBPR on rectal exam. GI was consulted current vitals Bp in 110s.   Assessment/Plan Active Problems:   Rectal bleeding   Assessment and Plan:  Rectal bleeding Possible mild acute diverticulitis Hemoccult positive Mild hypotension/Soft BP: Patient with ongoing rectal bleeding for a month PTA on Eliquis .  Hemoglobin stable here but BP soft, tachycardic lactic acid elevated troponin negative. Check serial H&H, continue  IV fluid hydration for hypertension, type and screen, continue PPI twice daily, GI has been consulted and await for further recommendation.Hold Eliquis  for now. Continue rocephin  and flagyl  iv.  Hyponatremia: Mild,monitor  Endometrial cancer-medically inoperable s/p IMRT and boost,  ast seen by GYN oncology on 5/5.  Had CT abdomen done in 5/6 no acute finding but w/o contrast  HTN CHFpEF last 60-5% 01/14/2023: PT on Lasix  40, losartan , metoprolol -will hold this medication continue fluid hydration given hypotension as above.  Chronic hypoxic respiratory failure: Continue supplemental oxygen  to maintain saturation 92%  A-fib w./w RVR CAD: Troponin negative no chest pain.  Holding Eliquis . CONT PO Metoprolol  and prn ic as well for a fib.  Recent cough: She is on antibiotics from PCP 5/6 continue same  Diabetes mellitus w/ HLD: on long-term insulin /Mounjaro /metformin -hold p.o. meds keep on low dose of long-acting insulin , add SSI  Mood disorder On chronic pain meds- oxycodone : Resume home Ativan /Abilify /Lexapro , prn Atarax  and also oxycodone  prn   Class 1 obesity w. Body mass index is 32.01 kg/m.  Advise weight loss  Severity of Illness: The appropriate patient status for this patient is OBSERVATION. Observation status is judged to be reasonable and necessary in order to provide the required intensity of service to ensure the patient's safety. The patient's presenting symptoms, physical exam findings, and initial radiographic and laboratory data in the context of their medical condition is felt to place them at decreased risk for further clinical deterioration. Furthermore, it is anticipated that the patient will be medically stable for discharge from the hospital within 2 midnights of admission.    DVT prophylaxis: SCDs Start: 10/07/23 1452 Code Status:   Code Status: Full Code  Family Communication: Admission, patients condition and plan of care including tests being ordered have  been discussed with the patient who indicate understanding and agree with the plan and Code Status.  Consults called:  GI  Review of Systems: All systems were reviewed and were negative except as mentioned in HPI above. 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   Benign paroxysmal positional vertigo due to bilateral vestibular disorder 05/20/2019   Bipolar 1 disorder (HCC) 01/23/2015   with GAD, benzo dependence   CAD (coronary artery disease)    Nonobstructive; Managed by Dr. [name]   Cardiomegaly 01/12/2018   CHF (congestive heart failure) 06/08/2022   Chronic back pain 01/04/2015   Chronic constipation 04/25/2020   Chronic diastolic heart failure (HCC) 05/30/2015   Chronic pain syndrome 08/22/2019   back pain, sacroiliitis   Chronic post-traumatic stress disorder (PTSD) 12/06/2020   Diabetic neuropathy (HCC) 02/06/2016   Dyslipidemia 09/24/2020   Essential hypertension    Functional diarrhea 10/26/2020   Herpes genitalis in women 07/16/2015   History of adenomatous polyp of colon 05/21/2019   Overview:   03/31/17: Colonoscopy: nonadvanced adenoma, microscopic colitis, f/u 5 yrs, [facility]/GAP   History of radiation therapy    Endometrial- 02/18/23-03/31/23-Dr. [name]   Insomnia 01/23/2015   Migraine headache with aura 02/12/2016   Myofascial pain dysfunction syndrome 08/22/2019   Non-alcoholic fatty liver disease 01/12/2018   OSA (obstructive sleep apnea) 02/24/2019   10/09/2018 - HST  - AHI 40.6    Osteopenia 12/06/2020   Rx alendronate  35 mg.   Pinguecula 12/06/2020   Presbyopia 12/06/2020   Pulmonary hypertension     RLS (restless legs syndrome) 04/27/2015   Tremor, essential 12/11/2021   Type II diabetes mellitus (HCC) 09/24/2020    Past Surgical History:  Procedure Laterality Date   BREAST REDUCTION SURGERY     EYE SURGERY Right    cateracts   HAMMER TOE SURGERY     LEFT HEART CATH AND CORONARY ANGIOGRAPHY N/A 02/03/2018   Procedure: LEFT HEART CATH AND CORONARY ANGIOGRAPHY;  Surgeon: [name], Peter M, MD;  Location: [facility] INVASIVE CV LAB;  Service: Cardiovascular;  Laterality: N/A;   REVERSE SHOULDER ARTHROPLASTY Right 08/19/2019   Procedure: REVERSE SHOULDER ARTHROPLASTY;  Surgeon: Winston Hawking, MD;  Location: [facility] ORS;  Service: Orthopedics;  Laterality: Right;  interscalene block   SHOULDER SURGERY Right    "I BROKE MY SHOUDLER   THIGH SURGERY     "TO REMOVE A TUMOR "     reports that she has never smoked. She has never used smokeless tobacco. She reports that she does not currently use alcohol . She reports that she does not use drugs.  Allergies  Allergen Reactions   Iodine Anaphylaxis   Ivp Dye [Iodinated Contrast Media] Anaphylaxis, Swelling and Other (See Comments)    Throat closes    Latex Other (See Comments)    Latex tape pulls skin with it   Tape Other (See Comments)    Pulls off the skin, if latex   Tizanidine  Other (See Comments)    Weakness, goofy, bad dreams    Family History  Problem Relation Age of Onset   Diabetes Mother    Heart disease Mother    Alzheimer's disease Father    Heart  disease Sister        CABG   Diabetes Sister    Alcohol  abuse Sister    Stroke Brother    Heart disease Brother    Mental illness Brother    Diabetes Brother    Breast cancer Neg Hx    Ovarian cancer Neg Hx    Colon cancer Neg Hx    Endometrial cancer Neg Hx      Prior to Admission medications   Medication Sig Start Date End Date Taking? Authorizing Provider  albuterol  (VENTOLIN  HFA) 108 (90 Base) MCG/ACT inhaler Inhale 2 puffs into the lungs every 4 (four) hours as needed for  wheezing or shortness of breath. 06/15/23   [name], MD  allopurinol  (ZYLOPRIM ) 100 MG tablet Take 100 mg by mouth daily. 10/22/22   [provider]  ANUCORT-HC  25 MG suppository Place 25 mg rectally daily. 09/28/23   [provider]  apixaban  (ELIQUIS ) 5 MG TABS tablet Take 1 tablet (5 mg total) by mouth 2 (two) times daily. 09/09/22   Jacqueline Matsu, MD  ARIPiprazole  (ABILIFY ) 2 MG tablet Take 2 mg by mouth at bedtime. 10/07/21   [provider]  atorvastatin  (LIPITOR) 80 MG tablet Take 1 tablet (80 mg total) by mouth daily. Patient taking differently: Take 80 mg by mouth every evening. 02/05/23 08/19/23  Von Grumbling, PA-C  clarithromycin (BIAXIN) 500 MG tablet Take 1 tablet (500 mg total) by mouth 2 (two) times daily. 10/06/23   [name], MD  clotrimazole  (MYCELEX ) 10 MG troche Take 10 mg by mouth in the morning and at bedtime. 05/28/23   [provider]  clotrimazole  (QC CLOTRIMAZOLE ) 1 % vaginal cream Place 1 Applicatorful vaginally at bedtime. 09/02/23   St Annice Kim, NP  Continuous Glucose Sensor (DEXCOM G7 SENSOR) MISC CHANGE SENSOR EVERY 10 DAYS 09/28/23   Shamleffer, Ibtehal Jaralla, MD  cyanocobalamin  (VITAMIN B12) 1000 MCG tablet Take 1 tablet (1,000 mcg total) by mouth daily. 09/04/22   Ghimire, Estil Heman, MD  denosumab  (PROLIA ) 60 MG/ML SOSY injection Inject 60 mg into the skin every 6 (six) months. 08/14/20   [name], MD  escitalopram  (LEXAPRO ) 10 MG tablet Take 10 mg by mouth daily.    [provider]  fluconazole  (DIFLUCAN ) 150 MG tablet Take 1 tablet (150 mg total) by mouth daily for 5 doses. 10/06/23 10/11/23  [name], MD  fluticasone -salmeterol (ADVAIR DISKUS) 500-50 MCG/ACT AEPB Inhale 1 puff into the lungs in the morning and at bedtime. 10/06/23   [name], MD  furosemide  (LASIX ) 40 MG tablet Take 40 mg by mouth 2 (two) times daily. 09/18/23   [provider]  hydrOXYzine  (VISTARIL ) 25 MG capsule  Take 1 capsule (25 mg total) by mouth 3 (three) times daily. 09/29/23   [name], MD  Insulin  Disposable Pump (OMNIPOD 5 G7 PODS, GEN 5,) MISC 1 Device by Does not apply route every other day. 02/12/23   Shamleffer, Ibtehal Jaralla, MD  insulin  glargine (LANTUS  SOLOSTAR) 100 UNIT/ML Solostar Pen Inject 30 Units into the skin 2 (two) times daily. 09/21/23   Shamleffer, Ibtehal Jaralla, MD  insulin  lispro (HUMALOG  KWIKPEN) 100 UNIT/ML KwikPen Max daily 45 units Patient taking differently: Inject 6-45 Units into the skin 3 (three) times daily. Max daily 45 units 05/22/23   Shamleffer, Julian Obey, MD  Insulin  Pen Needle 29G X MISC 1 Device by Does not apply route daily in the afternoon. 05/22/23   Shamleffer, Ibtehal Jaralla, MD  levocetirizine (XYZAL  ALLERGY 24HR) 5 MG tablet Take 1 tablet (5 mg total) by mouth every evening. For itch 05/12/23   [name], MD  lidocaine  (XYLOCAINE ) 2 % solution Use as directed 15 mLs in the mouth or throat every 4 (four) hours as needed for mouth pain. 05/18/23   [name], MD  LORazepam  (ATIVAN ) 0.5 MG tablet Take 0.5-1 mg by mouth See admin instructions. Take 0.5 mg by mouth in the morning and 1 mg at bedtime    [provider]  losartan  (COZAAR ) 25 MG tablet Take 25 mg by mouth daily. 02/14/23   [provider]  MAGNESIUM  PO Take 1 tablet by mouth daily.    [provider]  meclizine  (ANTIVERT ) 25 MG tablet Take 1 tablet (25 mg total) by mouth 3 (three) times daily as needed for dizziness. 06/05/23   Dettinger, Lucio Sabin, MD  metFORMIN  (GLUCOPHAGE -XR) 500 MG 24 hr tablet Take 2 tablets (1,000 mg total) by mouth daily with breakfast. 05/22/23   Shamleffer, Julian Obey, MD  Methocarbamol  1000 MG TABS Take 1,000 mg by mouth every 6 (six) hours as needed. 08/31/23   Lovorn, Megan, MD  metoprolol  tartrate (LOPRESSOR ) 50 MG tablet Take 1 tablet (50 mg total) by mouth 2 (two) times daily. 01/06/23   Jacqueline Matsu, MD   moxifloxacin  (AVELOX ) 400 MG tablet Take 1 tablet (400 mg total) by mouth daily. Patient not taking: Reported on 10/06/2023 09/29/23   [name], MD  naloxone  (NARCAN ) nasal spray 4 mg/0.1 mL Place 1 spray into the nose daily as needed (for over dose). 08/08/21   [provider]  nystatin  (MYCOSTATIN /NYSTOP ) powder Apply 1 Application topically as needed (rash). 06/16/22   Shamleffer, Ibtehal Jaralla, MD  nystatin  ointment (MYCOSTATIN ) Apply 1 Application topically 2 (two) times daily as needed (for rashes). 04/06/23   Javan Messing, NP  omeprazole (PRILOSEC) 40 MG capsule Take 40 mg by mouth daily.    [provider]  ondansetron  (ZOFRAN ) 4 MG tablet Take 1 tablet (4 mg total) by mouth every 8 (eight) hours as needed for nausea or vomiting. For cancer related nausea 02/16/23   Lovorn, Megan, MD  OXcarbazepine  (TRILEPTAL ) 150 MG tablet Take 1 tablet (150 mg total) by mouth 2 (two) times daily. 06/13/22   Sheikh, Omair Latif, DO  Oxycodone  HCl 20 MG TABS Take 1 tablet (20 mg total) by mouth 2 (two) times daily as needed. Do Not Fill Before 10/05/23 08/31/23   Lovorn, Megan, MD  potassium chloride  (KLOR-CON ) 10 MEQ tablet Take 1 tablet (10 mEq total) by mouth daily. 02/05/23 08/19/23  Von Grumbling, PA-C  predniSONE  (DELTASONE ) 10 MG tablet Take 5 daily for 2 days followed by 4,3,2 and 1 for 2 days each. 09/29/23   [name], MD  pregabalin  (LYRICA ) 200 MG capsule Take 200 mg by mouth 2 (two) times daily.    [provider]  promethazine  (PHENERGAN ) 25 MG tablet Take 0.5-1 tablets (12.5-25 mg total) by mouth every 6 (six) hours as needed for refractory nausea / vomiting (cancer related nausea/vomiting). 02/16/23   Lovorn, Megan, MD  rOPINIRole  (REQUIP ) 1 MG tablet Take 1 tablet (1 mg total) by mouth at bedtime. For leg cramps 09/07/23   [name], MD  senna-docusate (SENOKOT-S) 8.6-50 MG tablet Take 1 tablet by mouth at bedtime. 06/13/22   Aura Leeds Latif, DO   spironolactone  (ALDACTONE ) 25 MG tablet Take 0.5 tablets (12.5 mg total) by mouth daily. 01/31/23 06/11/23  Verlyn Goad,  MD  Suvorexant  (BELSOMRA ) 20 MG TABS Take 1 tablet (20 mg total) by mouth at bedtime as needed. Patient taking differently: Take 20 mg by mouth at bedtime as needed (for sleep). 08/13/23   [name], MD  tirzepatide  (MOUNJARO ) 12.5 MG/0.5ML Pen Inject 12.5 mg into the skin once a week. 09/21/23   Shamleffer, Julian Obey, MD  Vitamin D , Cholecalciferol , 25 MCG (1000 UT) TABS Take 1,000 Units by mouth daily in the afternoon. 07/26/20   [provider]    Physical Exam: Vitals:   10/07/23 1430 10/07/23 1502 10/07/23 1556 10/07/23 1600  BP: 114/81   (!) 119/54  Pulse: (!) 111   84  Resp: 18   (!) 23  Temp:  98.7 F (37.1 C) 98.3 F (36.8 C)   TempSrc:  Oral Oral   SpO2: (!) 88%   97%  Weight:  79.4 kg    Height:  5\' 2"  (1.575 m)     General exam: AAO , NAD, weak appearing. HEENT:Oral mucosa moist, Ear/Nose WNL grossly, dentition normal. Respiratory system: bilaterally clear,no wheezing or crackles,no use of accessory muscle Cardiovascular system: S1 & S2 +, irregular rhythm,  No JVD,. Gastrointestinal system: Abdomen soft, NT,ND, BS+ Nervous System:Alert, awake, moving extremities and grossly nonfocal Extremities: No edema, distal peripheral pulses palpable.  Skin: No rashes,no icterus. MSK: Normal muscle bulk,tone, power   Labs on Admission: I have personally reviewed following labs and imaging studies  CBC: Recent Labs  Lab 10/07/23 1037 10/07/23 1520  WBC 8.7 10.0  NEUTROABS 6.5 7.6  HGB 12.1 14.1  HCT 36.5 43.2  MCV 91.7 91.7  PLT 227 211   Basic Metabolic Panel: Recent Labs  Lab 10/07/23 1037 10/07/23 1520  NA 130* 136  K 3.8 3.8  CL 92* 95*  CO2 25 28  GLUCOSE 175* 103*  BUN 31* 29*  CREATININE 0.96 0.89  CALCIUM  9.6 9.6   Estimated Creatinine Clearance: 52.5 mL/min (by C-G formula based on SCr of 0.89 mg/dL). Recent  Labs  Lab 10/07/23 1037 10/07/23 1520  AST 24 19  ALT 17 16  ALKPHOS 67 66  BILITOT 0.8 0.9  PROT 6.9 7.5  ALBUMIN  3.6 4.1   Recent Labs  Lab 10/07/23 1037  LIPASE 46   No results for input(s): "AMMONIA" in the last 168 hours. Coagulation Profile: Recent Labs  Lab 10/07/23 1037 10/07/23 1520  INR 1.4* 1.3*   Cardiac Panel (last 3 results) Recent Labs    10/07/23 1037  TROPONINIHS 14       Component Value Date/Time   COLORURINE STRAW (A) 10/07/2023 1037   APPEARANCEUR CLEAR 10/07/2023 1037   APPEARANCEUR Clear 09/07/2023 1135   LABSPEC 1.005 10/07/2023 1037   PHURINE 7.0 10/07/2023 1037   GLUCOSEU NEGATIVE 10/07/2023 1037   HGBUR SMALL (A) 10/07/2023 1037   BILIRUBINUR NEGATIVE 10/07/2023 1037   BILIRUBINUR Negative 09/07/2023 1135   KETONESUR NEGATIVE 10/07/2023 1037   PROTEINUR NEGATIVE 10/07/2023 1037   UROBILINOGEN 0.2 07/23/2015 1350   NITRITE NEGATIVE 10/07/2023 1037   LEUKOCYTESUR NEGATIVE 10/07/2023 1037    Radiological Exams on Admission: DG Chest 2 View Result Date: 10/07/2023 CLINICAL DATA:  Cough. EXAM: CHEST - 2 VIEW COMPARISON:  Chest radiograph 09/29/2023 FINDINGS: Rotated to the left. Cardiomegaly. Mitral annular vascular calcifications. No large area pulmonary consolidation. No pleural effusion or pneumothorax. Thoracic spine degenerative changes. IMPRESSION: Cardiomegaly. No acute cardiopulmonary process. Electronically Signed   By: Jone Neither M.D.   On: 10/07/2023 16:12   CT ABDOMEN  PELVIS WO CONTRAST Result Date: 10/07/2023 CLINICAL DATA:  Abdominal pain.  Rectal bleeding. EXAM: CT ABDOMEN AND PELVIS WITHOUT CONTRAST TECHNIQUE: Multidetector CT imaging of the abdomen and pelvis was performed following the standard protocol without IV contrast. RADIATION DOSE REDUCTION: This exam was performed according to the departmental dose-optimization program which includes automated exposure control, adjustment of the mA and/or kV according to patient size  and/or use of iterative reconstruction technique. COMPARISON:  CT abdomen pelvis dated 10/06/2023. FINDINGS: Evaluation of this exam is limited in the absence of intravenous contrast. Lower chest: The visualized lung bases are clear. There is mild cardiomegaly. Calcification of the mitral annulus. No intra-abdominal free air or free fluid. Hepatobiliary: The liver is unremarkable. No biliary ductal dilatation. The gallbladder is unremarkable. Pancreas: Unremarkable. No pancreatic ductal dilatation or surrounding inflammatory changes. Spleen: Normal in size without focal abnormality. Adrenals/Urinary Tract: The adrenal glands are unremarkable. There is no hydronephrosis or nephrolithiasis on either side. The visualized ureters and urinary bladder appear unremarkable. Stomach/Bowel: There is sigmoid diverticulosis. Minimal perisigmoid haziness may be chronic or represent mild acute diverticulitis. Clinical correlation is recommended. No abscess or perforation. There is no bowel obstruction. The appendix is normal. Vascular/Lymphatic: Mild aortoiliac atherosclerotic disease. The IVC is unremarkable. No portal venous gas. There is no adenopathy. Reproductive: The uterus and ovaries are grossly remarkable. No suspicious adnexal masses. Other: Small fat containing right inguinal hernia. Musculoskeletal: Osteopenia with degenerative changes spine. No acute osseous pathology. IMPRESSION: 1. Sigmoid diverticulosis with minimal perisigmoid haziness may be chronic or represent mild acute diverticulitis. No abscess or perforation. 2. No hydronephrosis or nephrolithiasis. 3. Normal appendix. 4.  Aortic Atherosclerosis (ICD10-I70.0). Electronically Signed   By: Angus Bark M.D.   On: 10/07/2023 12:01   CT ABDOMEN PELVIS WO CONTRAST Result Date: 10/06/2023 CT ABDOMEN PELVIS WO CONTRAST 10/06/2023 5:31 PM CDT CLINICAL HISTORY: Female, 78 years old. Genital cancer, monitor. Pt c/o abdominal pain and rectal bleeding; grade 1  endometrioid endometrial cancer COMPARISON: PET/CT from 09/23/2023 PROCEDURE COMMENTS: CT of the abdomen and pelvis was performed without the administration of IV contrast. Oral contrast was not administered prior to the examination. CT was performed using one or more dose reduction techniques including automated exposure control, adjustment of the mA and/or kV according to patient size, and/or use of iterative reconstruction technique. Unless otherwise stated, incidental findings identified in this report do not require routine follow-up. 3-D reconstructions if necessary were generated. FINDINGS: Lower thorax: Moderate degenerative changes within bilateral lung bases. No pleural or pericardial effusion. Heart is moderately enlarged in size. Liver and biliary tree: Liver is normal in morphology. No focal lesions are identified. No intra- or extrahepatic biliary dilatation. Gallbladder: Normal Spleen: Normal noncontrast CT appearance. Pancreas: Normal noncontrast CT appearance Adrenal glands: Normal noncontrast CT appearance. Kidneys and ureters: No hydronephrosis. No radiopaque renal calculi. Gastrointestinal tract: Visualized bowel demonstrate no evidence of wall thickening or obstruction. Normal appendix in the right lower quadrant. Scattered colonic diverticulosis without CT evidence of acute diverticulitis. Bladder: Normal. Pelvis Organs: Normal uterus. Incomplete evaluation of the endometrium in a patient with a reported history of endometrial cancer. No suspicious or worrisome adnexal mass. Peritoneal cavity: No free fluid or free air. Vasculature: Mild scattered atherosclerotic calcifications without abdominal aortic aneurysm. Lymph nodes: No pathologically enlarged lymphadenopathy within the abdomen or pelvis. Abdominal wall: Normal. Musculoskeletal: No suspicious osseous lesions. Mild multilevel degenerative changes of the lower lumbar spine. IMPRESSION: 1. No acute process identified within the abdomen.  Specifically, no evidence of  abnormal bowel wall thickening or obstruction. Scattered colonic diverticulosis without evidence of diverticulitis. 2. Incomplete evaluation of the endometrium in a patient with a reported history of endometrial cancer. 3. No pathologically enlarged lymphadenopathy or imaging findings of distant metastases identified in the abdomen and pelvis on limited noncontrast CT. Electronically signed by: Edrie Gower MD 10/06/2023 08:20 PM EDT RP Workstation: [id number]   Lesa Rape MD [facility]  If 7PM-7AM, please contact night-coverage [email or web address]  10/07/2023, 4:57 PM

## 2023-10-07 NOTE — ED Triage Notes (Signed)
 Pt c/o rectal bleeding off and on for a month. Pt states it began again last night with red and dark red blood.

## 2023-10-07 NOTE — Hospital Course (Addendum)
 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 She was having a persistent cough and seen by PCP 5/6 AND placed on clarithromycin , also seen by GYN oncology recently and had CT abdomen pelvis obtained on 5/6> no acute process, incomplete evaluation of endometrial, no pathologically enlarged endocrinopathy. In the ED, initial blood pressure 113> subsequently 99/49, tachycardic up to 126, tachypneic 24 Labs with hyponatremia 130 BUN 13 creatinine 0.9 troponin 48 lactic acid 1.6 hemoglobin 12.1, UA unremarkable, Hemoccult positive. CT abdomen pelvis w/o contrast>> sigmoid diverticulosis w/ minimal perisigmoid  haziness may be chronic or mild acute diverticulitis, no abscess, no hydronephrosis normal appendix Patient on Protonix , antiemetics and reselected 500 cc, Unasyn  and admission was requested [facility] on rectal exam. GI was consulted and admitted, placed on ceftriaxone  and Flagyl  given concern for mild acute diverticulitis on imaging.  Subjective: Seen and examined On the bedside still having some abdominal pain, last BM 5/7 Trending up BP and afebrile overnight Labs reviewed hemoglobin improved to 11.7 WBC normal, BMP normal fairly Does not feel ready to go home yet Still on clears  Assessment and plan:  Bright red blood Rectal bleeding Possible mild acute diverticulitis Hemoccult positive Mild hypotension/Soft BP on admission ABLA mild: W/ ongoing rectal bleeding for a month while on Eliquis  and hb downtrending and  soft blood pressure on admission admitted to stepdown unit . GI following closely continue to hold Eliquis , cont PPI twice daily Continue empiric Rocephin  and Flagy due to concern for acute diverticulitis on CT scan  Hemoglobin trending up and stable, cont to trend. Await further GI plan- On CLD- ADAT per GI. Recent Labs  Lab 10/08/23 0349 10/08/23 0955 10/08/23 1551 10/08/23 2157 10/09/23 0612  HGB 11.1* 11.0* 10.5* 10.3* 11.7*  HCT 34.9* 33.6* 34.0* 33.5* 36.8    Hyponatremia: resolved   Endometrial cancer-medically inoperable s/p IMRT and boost,  last seen by GYN oncology on 5/5.  Had CT abdomen done in 5/6 no acute finding but w/o contrast-she will follow-up with GYN.   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   A-fib w./w RVR CAD: Rate uncontrolled on admission now stable on metoprolol  p.o. Troponin negative no chest pain. Cont holding Eliquis  due to #1 until cleared by GI   Recent cough: She is on antibiotics from PCP 5/6 continue same as Rocephin .  Continue antitussives   Diabetes mellitus w/ HLD: on long-term insulin /Mounjaro /metformin -hold p.o. meds keep on lower long-acting insulin , SSI Recent Labs  Lab 10/08/23 0731 10/08/23 1941 10/08/23 2349 10/09/23 0429 10/09/23 0733  GLUCAP 79 139* 83 84 106*     Mood disorder On chronic pain meds- oxycodone : Cont home Ativan /Abilify /Lexapro , prn Atarax  and also oxycodone   bid as lower dose since patient reports deep sleep which is unusual last night    Class 1 obesity w. Body mass index is 32.01 kg/m.  Advise weight  loss

## 2023-10-07 NOTE — Consult Note (Signed)
 Gastroenterology Consult   Referring Provider: No ref. provider found Primary Care Physician:  [name], MD Primary Gastroenterologist:  [name]  Patient ID: Amber Stephenson; [id number]; [birth year]   Admit date: 10/07/2023  LOS: 0 days   Date of Consultation: 10/07/2023  Reason for Consultation:  rectal bleeding   History of Present Illness   Amber Stephenson is a 78 y.o. year old female with endometrioid endometrial cancer, medically inoperable, s/p IMRT, Afib,  bipolar 1 disorder, CAD, CHF, chronic pain, DM, HTN, Herpes, NAFLD, OSA, osteopenia, RLS, pulmonary htn who presented to the ED with worsening rectal bleeding and heme positive stool. Notably with reported rectal bleeding since April per chart review. GI consulted for further evaluation  ED Course: CT A/P with contrast with minimal perisigmoid haziness, chronic vs mild acute diverticulitis, no abscess or perforation Hgb 12.1  Sodium 130, BUN 31 INR 1.4  WBC 8.7 Tachycardic at 126, BP soft in the ED as low as 91/49  Consult: Patient reports rectal bleeding/melena for some time, she is unable to tell me how long. Per chart review having melena back in December 2024, rectal bleeding since late 2023. seeing [facility], in process of obtaining cardiac clearance for bidirectional endoscopic evaluations per the patient. She is on eliquis , last dose was 1030 Tuesday night. Endorses diffuse abdominal pain and nausea. No vomiting. No changes in bowel habits, constipation or diarrhea. Has had fluctuations in her weight. Reports GERD symptoms but no dysphagia, does not take GERD therapy currently. No ETOH or NSAIDs. 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   Benign paroxysmal positional vertigo due to bilateral vestibular disorder 05/20/2019   Bipolar 1 disorder (HCC) 01/23/2015   with GAD, benzo dependence   CAD (coronary artery disease)    Nonobstructive; Managed by Dr. [name]   Cardiomegaly 01/12/2018   CHF (congestive heart failure) 06/08/2022   Chronic back pain 01/04/2015   Chronic constipation 04/25/2020   Chronic diastolic heart failure (HCC) 05/30/2015   Chronic pain syndrome 08/22/2019   back pain, sacroiliitis   Chronic post-traumatic stress disorder (PTSD) 12/06/2020   Diabetic neuropathy (HCC) 02/06/2016   Dyslipidemia 09/24/2020   Essential hypertension    Functional diarrhea 10/26/2020   Herpes genitalis in women 07/16/2015   History of adenomatous polyp of colon 05/21/2019   Overview:   03/31/17: Colonoscopy: nonadvanced adenoma, microscopic colitis, f/u 5 yrs, [facility]/GAP   History of radiation therapy    Endometrial- 02/18/23-03/31/23-Dr. [name]   Insomnia 01/23/2015   Migraine headache with aura 02/12/2016   Myofascial pain dysfunction syndrome 08/22/2019   Non-alcoholic fatty liver disease 01/12/2018   OSA (obstructive sleep apnea) 02/24/2019   10/09/2018 - HST  - AHI 40.6    Osteopenia 12/06/2020   Rx alendronate  35 mg.   Pinguecula 12/06/2020   Presbyopia 12/06/2020   Pulmonary hypertension    RLS (restless legs syndrome) 04/27/2015   Tremor, essential 12/11/2021   Type II diabetes mellitus (HCC) 09/24/2020    Past Surgical History:  Procedure Laterality Date   BREAST REDUCTION SURGERY  EYE SURGERY Right    cateracts   HAMMER TOE SURGERY     LEFT HEART CATH AND CORONARY ANGIOGRAPHY N/A 02/03/2018   Procedure: LEFT HEART CATH AND CORONARY ANGIOGRAPHY;  Surgeon: [name], Peter M, MD;  Location: [facility] INVASIVE CV  LAB;  Service: Cardiovascular;  Laterality: N/A;   REVERSE SHOULDER ARTHROPLASTY Right 08/19/2019   Procedure: REVERSE SHOULDER ARTHROPLASTY;  Surgeon: Winston Hawking, MD;  Location: [facility] ORS;  Service: Orthopedics;  Laterality: Right;  interscalene block   SHOULDER SURGERY Right    "I BROKE MY SHOUDLER   THIGH SURGERY     "TO REMOVE A TUMOR "    Prior to Admission medications   Medication Sig Start Date End Date Taking? Authorizing Provider  albuterol  (VENTOLIN  HFA) 108 (90 Base) MCG/ACT inhaler Inhale 2 puffs into the lungs every 4 (four) hours as needed for wheezing or shortness of breath. 06/15/23   [name], MD  allopurinol  (ZYLOPRIM ) 100 MG tablet Take 100 mg by mouth daily. 10/22/22   [provider]  ANUCORT-HC  25 MG suppository Place 25 mg rectally daily. 09/28/23   [provider]  apixaban  (ELIQUIS ) 5 MG TABS tablet Take 1 tablet (5 mg total) by mouth 2 (two) times daily. 09/09/22   Jacqueline Matsu, MD  ARIPiprazole  (ABILIFY ) 2 MG tablet Take 2 mg by mouth at bedtime. 10/07/21   [provider]  atorvastatin  (LIPITOR) 80 MG tablet Take 1 tablet (80 mg total) by mouth daily. Patient taking differently: Take 80 mg by mouth every evening. 02/05/23 08/19/23  Von Grumbling, PA-C  clarithromycin (BIAXIN) 500 MG tablet Take 1 tablet (500 mg total) by mouth 2 (two) times daily. 10/06/23   [name], MD  clotrimazole  (MYCELEX ) 10 MG troche Take 10 mg by mouth in the morning and at bedtime. 05/28/23   [provider]  clotrimazole  (QC CLOTRIMAZOLE ) 1 % vaginal cream Place 1 Applicatorful vaginally at bedtime. 09/02/23   St Annice Kim, NP  Continuous Glucose Sensor (DEXCOM G7 SENSOR) MISC CHANGE SENSOR EVERY 10 DAYS 09/28/23   Shamleffer, Ibtehal Jaralla, MD  cyanocobalamin  (VITAMIN B12) 1000 MCG tablet Take 1 tablet (1,000 mcg total) by mouth daily. 09/04/22   Ghimire, Estil Heman, MD  denosumab  (PROLIA ) 60 MG/ML SOSY injection Inject 60 mg into the skin  every 6 (six) months. 08/14/20   [name], MD  escitalopram  (LEXAPRO ) 10 MG tablet Take 10 mg by mouth daily.    [provider]  fluconazole  (DIFLUCAN ) 150 MG tablet Take 1 tablet (150 mg total) by mouth daily for 5 doses. 10/06/23 10/11/23  [name], MD  fluticasone -salmeterol (ADVAIR DISKUS) 500-50 MCG/ACT AEPB Inhale 1 puff into the lungs in the morning and at bedtime. 10/06/23   [name], MD  furosemide  (LASIX ) 40 MG tablet Take 40 mg by mouth 2 (two) times daily. 09/18/23   [provider]  hydrOXYzine  (VISTARIL ) 25 MG capsule Take 1 capsule (25 mg total) by mouth 3 (three) times daily. 09/29/23   [name], MD  Insulin  Disposable Pump (OMNIPOD 5 G7 PODS, GEN 5,) MISC 1 Device by Does not apply route every other day. 02/12/23   Shamleffer, Ibtehal Jaralla, MD  insulin  glargine (LANTUS  SOLOSTAR) 100 UNIT/ML Solostar Pen Inject 30 Units into the skin 2 (two) times daily. 09/21/23   Shamleffer, Ibtehal Jaralla, MD  insulin  lispro (HUMALOG  KWIKPEN) 100 UNIT/ML KwikPen Max daily 45 units Patient taking differently: Inject 6-45 Units into the skin 3 (three) times daily. Max daily 45  units 05/22/23   Shamleffer, Julian Obey, MD  Insulin  Pen Needle 29G X MISC 1 Device by Does not apply route daily in the afternoon. 05/22/23   Shamleffer, Ibtehal Jaralla, MD  levocetirizine (XYZAL  ALLERGY 24HR) 5 MG tablet Take 1 tablet (5 mg total) by mouth every evening. For itch 05/12/23   [name], MD  lidocaine  (XYLOCAINE ) 2 % solution Use as directed 15 mLs in the mouth or throat every 4 (four) hours as needed for mouth pain. 05/18/23   [name], MD  LORazepam  (ATIVAN ) 0.5 MG tablet Take 0.5-1 mg by mouth See admin instructions. Take 0.5 mg by mouth in the morning and 1 mg at bedtime    [provider]  losartan  (COZAAR ) 25 MG tablet Take 25 mg by mouth daily. 02/14/23   [provider]  MAGNESIUM  PO Take 1 tablet by mouth daily.    [provider]  meclizine  (ANTIVERT ) 25 MG tablet Take 1 tablet (25 mg total) by mouth 3 (three) times daily as needed for dizziness. 06/05/23   Dettinger, Lucio Sabin, MD  metFORMIN  (GLUCOPHAGE -XR) 500 MG 24 hr tablet Take 2 tablets (1,000 mg total) by mouth daily with breakfast. 05/22/23   Shamleffer, Julian Obey, MD  Methocarbamol  1000 MG TABS Take 1,000 mg by mouth every 6 (six) hours as needed. 08/31/23   Lovorn, Megan, MD  metoprolol  tartrate (LOPRESSOR ) 50 MG tablet Take 1 tablet (50 mg total) by mouth 2 (two) times daily. 01/06/23   Jacqueline Matsu, MD  moxifloxacin  (AVELOX ) 400 MG tablet Take 1 tablet (400 mg total) by mouth daily. Patient not taking: Reported on 10/06/2023 09/29/23   [name], MD  naloxone  (NARCAN ) nasal spray 4 mg/0.1 mL Place 1 spray into the nose daily as needed (for over dose). 08/08/21   [provider]  nystatin  (MYCOSTATIN /NYSTOP ) powder Apply 1 Application topically as needed (rash). 06/16/22   Shamleffer, Ibtehal Jaralla, MD  nystatin  ointment (MYCOSTATIN ) Apply 1 Application topically 2 (two) times daily as needed (for rashes). 04/06/23   Javan Messing, NP  omeprazole (PRILOSEC) 40 MG capsule Take 40 mg by mouth daily.    [provider]  ondansetron  (ZOFRAN ) 4 MG tablet Take 1 tablet (4 mg total) by mouth every 8 (eight) hours as needed for nausea or vomiting. For cancer related nausea 02/16/23   Lovorn, Megan, MD  OXcarbazepine  (TRILEPTAL ) 150 MG tablet Take 1 tablet (150 mg total) by mouth 2 (two) times daily. 06/13/22   Sheikh, Omair Latif, DO  Oxycodone  HCl 20 MG TABS Take 1 tablet (20 mg total) by mouth 2 (two) times daily as needed. Do Not Fill Before 10/05/23 08/31/23   Lovorn, Megan, MD  potassium chloride  (KLOR-CON ) 10 MEQ tablet Take 1 tablet (10 mEq total) by mouth daily. 02/05/23 08/19/23  Von Grumbling, PA-C  predniSONE  (DELTASONE ) 10 MG tablet Take 5 daily for 2 days followed by 4,3,2 and 1 for 2 days each. 09/29/23   [name], MD   pregabalin  (LYRICA ) 200 MG capsule Take 200 mg by mouth 2 (two) times daily.    [provider]  promethazine  (PHENERGAN ) 25 MG tablet Take 0.5-1 tablets (12.5-25 mg total) by mouth every 6 (six) hours as needed for refractory nausea / vomiting (cancer related nausea/vomiting). 02/16/23   Lovorn, Megan, MD  rOPINIRole  (REQUIP ) 1 MG tablet Take 1 tablet (1 mg total) by mouth at bedtime. For leg cramps 09/07/23   [name], MD  senna-docusate (SENOKOT-S) 8.6-50 MG tablet  Take 1 tablet by mouth at bedtime. 06/13/22   Aura Leeds Latif, DO  spironolactone  (ALDACTONE ) 25 MG tablet Take 0.5 tablets (12.5 mg total) by mouth daily. 01/31/23 06/11/23  Verlyn Goad, MD  Suvorexant  (BELSOMRA ) 20 MG TABS Take 1 tablet (20 mg total) by mouth at bedtime as needed. Patient taking differently: Take 20 mg by mouth at bedtime as needed (for sleep). 08/13/23   [name], MD  tirzepatide  (MOUNJARO ) 12.5 MG/0.5ML Pen Inject 12.5 mg into the skin once a week. 09/21/23   Shamleffer, Julian Obey, MD  Vitamin D , Cholecalciferol , 25 MCG (1000 UT) TABS Take 1,000 Units by mouth daily in the afternoon. 07/26/20   [provider]    Current Facility-Administered Medications  Medication Dose Route Frequency Provider Last Rate Last Admin   Ampicillin -Sulbactam (UNASYN ) 3 g in sodium chloride  0.9 % 100 mL IVPB  3 g Intravenous Once Rigney, Christopher D, PA-C       metoprolol  tartrate (LOPRESSOR ) injection 5 mg  5 mg Intravenous Q5 min PRN Roselynn Connors, PA-C       Current Outpatient Medications  Medication Sig Dispense Refill   albuterol  (VENTOLIN  HFA) 108 (90 Base) MCG/ACT inhaler Inhale 2 puffs into the lungs every 4 (four) hours as needed for wheezing or shortness of breath. 1 each 11   allopurinol  (ZYLOPRIM ) 100 MG tablet Take 100 mg by mouth daily.     ANUCORT-HC  25 MG suppository Place 25 mg rectally daily.     apixaban  (ELIQUIS ) 5 MG TABS tablet Take 1 tablet (5 mg total) by mouth 2  (two) times daily. 180 tablet 3   ARIPiprazole  (ABILIFY ) 2 MG tablet Take 2 mg by mouth at bedtime.     atorvastatin  (LIPITOR) 80 MG tablet Take 1 tablet (80 mg total) by mouth daily. (Patient taking differently: Take 80 mg by mouth every evening.) 90 tablet 3   clarithromycin (BIAXIN) 500 MG tablet Take 1 tablet (500 mg total) by mouth 2 (two) times daily. 20 tablet 0   clotrimazole  (MYCELEX ) 10 MG troche Take 10 mg by mouth in the morning and at bedtime.     clotrimazole  (QC CLOTRIMAZOLE ) 1 % vaginal cream Place 1 Applicatorful vaginally at bedtime. 45 g 0   Continuous Glucose Sensor (DEXCOM G7 SENSOR) MISC CHANGE SENSOR EVERY 10 DAYS 9 each 3   cyanocobalamin  (VITAMIN B12) 1000 MCG tablet Take 1 tablet (1,000 mcg total) by mouth daily. 30 tablet 1   denosumab  (PROLIA ) 60 MG/ML SOSY injection Inject 60 mg into the skin every 6 (six) months. 1 mL 1   escitalopram  (LEXAPRO ) 10 MG tablet Take 10 mg by mouth daily.     fluconazole  (DIFLUCAN ) 150 MG tablet Take 1 tablet (150 mg total) by mouth daily for 5 doses. 5 tablet 0   fluticasone -salmeterol (ADVAIR DISKUS) 500-50 MCG/ACT AEPB Inhale 1 puff into the lungs in the morning and at bedtime. 60 each 5   furosemide  (LASIX ) 40 MG tablet Take 40 mg by mouth 2 (two) times daily.     hydrOXYzine  (VISTARIL ) 25 MG capsule Take 1 capsule (25 mg total) by mouth 3 (three) times daily. 30 capsule 5   Insulin  Disposable Pump (OMNIPOD 5 G7 PODS, GEN 5,) MISC 1 Device by Does not apply route every other day. 45 each 3   insulin  glargine (LANTUS  SOLOSTAR) 100 UNIT/ML Solostar Pen Inject 30 Units into the skin 2 (two) times daily. 60 mL 4   insulin  lispro (HUMALOG  KWIKPEN) 100 UNIT/ML KwikPen Max  daily 45 units (Patient taking differently: Inject 6-45 Units into the skin 3 (three) times daily. Max daily 45 units) 45 mL 6   Insulin  Pen Needle 29G X MISC 1 Device by Does not apply route daily in the afternoon. 400 each 3   levocetirizine (XYZAL  ALLERGY 24HR) 5 MG  tablet Take 1 tablet (5 mg total) by mouth every evening. For itch     lidocaine  (XYLOCAINE ) 2 % solution Use as directed 15 mLs in the mouth or throat every 4 (four) hours as needed for mouth pain. 100 mL 5   LORazepam  (ATIVAN ) 0.5 MG tablet Take 0.5-1 mg by mouth See admin instructions. Take 0.5 mg by mouth in the morning and 1 mg at bedtime     losartan  (COZAAR ) 25 MG tablet Take 25 mg by mouth daily.     MAGNESIUM  PO Take 1 tablet by mouth daily.     meclizine  (ANTIVERT ) 25 MG tablet Take 1 tablet (25 mg total) by mouth 3 (three) times daily as needed for dizziness. 60 tablet 1   metFORMIN  (GLUCOPHAGE -XR) 500 MG 24 hr tablet Take 2 tablets (1,000 mg total) by mouth daily with breakfast. 180 tablet 3   Methocarbamol  1000 MG TABS Take 1,000 mg by mouth every 6 (six) hours as needed. 120 tablet 5   metoprolol  tartrate (LOPRESSOR ) 50 MG tablet Take 1 tablet (50 mg total) by mouth 2 (two) times daily. 180 tablet 2   moxifloxacin  (AVELOX ) 400 MG tablet Take 1 tablet (400 mg total) by mouth daily. (Patient not taking: Reported on 10/06/2023) 10 tablet 0   naloxone  (NARCAN ) nasal spray 4 mg/0.1 mL Place 1 spray into the nose daily as needed (for over dose).     nystatin  (MYCOSTATIN /NYSTOP ) powder Apply 1 Application topically as needed (rash). 15 g 2   nystatin  ointment (MYCOSTATIN ) Apply 1 Application topically 2 (two) times daily as needed (for rashes). 30 g 1   omeprazole (PRILOSEC) 40 MG capsule Take 40 mg by mouth daily.     ondansetron  (ZOFRAN ) 4 MG tablet Take 1 tablet (4 mg total) by mouth every 8 (eight) hours as needed for nausea or vomiting. For cancer related nausea 90 tablet 5   OXcarbazepine  (TRILEPTAL ) 150 MG tablet Take 1 tablet (150 mg total) by mouth 2 (two) times daily. 60 tablet 0   Oxycodone  HCl 20 MG TABS Take 1 tablet (20 mg total) by mouth 2 (two) times daily as needed. Do Not Fill Before 10/05/23 60 tablet 0   potassium chloride  (KLOR-CON ) 10 MEQ tablet Take 1 tablet (10 mEq total)  by mouth daily. 90 tablet 3   predniSONE  (DELTASONE ) 10 MG tablet Take 5 daily for 2 days followed by 4,3,2 and 1 for 2 days each. 30 tablet 0   pregabalin  (LYRICA ) 200 MG capsule Take 200 mg by mouth 2 (two) times daily.     promethazine  (PHENERGAN ) 25 MG tablet Take 0.5-1 tablets (12.5-25 mg total) by mouth every 6 (six) hours as needed for refractory nausea / vomiting (cancer related nausea/vomiting). 120 tablet 5   rOPINIRole  (REQUIP ) 1 MG tablet Take 1 tablet (1 mg total) by mouth at bedtime. For leg cramps 30 tablet 5   senna-docusate (SENOKOT-S) 8.6-50 MG tablet Take 1 tablet by mouth at bedtime. 30 tablet 0   spironolactone  (ALDACTONE ) 25 MG tablet Take 0.5 tablets (12.5 mg total) by mouth daily. 15 tablet 2   Suvorexant  (BELSOMRA ) 20 MG TABS Take 1 tablet (20 mg total) by mouth at  bedtime as needed. (Patient taking differently: Take 20 mg by mouth at bedtime as needed (for sleep).) 30 tablet 5   tirzepatide  (MOUNJARO ) 12.5 MG/0.5ML Pen Inject 12.5 mg into the skin once a week. 6 mL 3   Vitamin D , Cholecalciferol , 25 MCG (1000 UT) TABS Take 1,000 Units by mouth daily in the afternoon.     Facility-Administered Medications Ordered in Other Encounters  Medication Dose Route Frequency Provider Last Rate Last Admin   bupivacaine -epinephrine  (MARCAINE  W/ EPI) 0.5% -1:200000 injection    Anesthesia Intra-op Hollis, Kevin D, MD   12 mL at 01/21/19 0935    Allergies as of 10/07/2023 - Review Complete 10/07/2023  Allergen Reaction Noted   Iodine Anaphylaxis 06/28/2003   Ivp dye [iodinated contrast media] Anaphylaxis, Swelling, and Other (See Comments) 12/13/2014   Latex Other (See Comments) 09/29/2018   Tape Other (See Comments) 01/28/2023   Tizanidine  Other (See Comments) 03/10/2022    Family History  Problem Relation Age of Onset   Diabetes Mother    Heart disease Mother    Alzheimer's disease Father    Heart disease Sister        CABG   Diabetes Sister    Alcohol  abuse Sister     Stroke Brother    Heart disease Brother    Mental illness Brother    Diabetes Brother    Breast cancer Neg Hx    Ovarian cancer Neg Hx    Colon cancer Neg Hx    Endometrial cancer Neg Hx     Social History   Socioeconomic History   Marital status: Married    Spouse name: Financial planner   Number of children: 3   Years of education: 15   Highest education level: Associate degree: academic program  Occupational History   Occupation: Retired    Comment: Chief Financial Officer  Tobacco Use   Smoking status: Never   Smokeless tobacco: Never  Vaping Use   Vaping status: Never Used  Substance and Sexual Activity   Alcohol  use: Not Currently    Alcohol /week: 0.0 standard drinks of alcohol    Drug use: No   Sexual activity: Yes    Partners: Male    Birth control/protection: Post-menopausal  Other Topics Concern   Not on file  Social History Narrative   Lives at home with husband.    They have 3 children who live away - [place] , [place] , and [place] .   She is from [place]  and most of her family lives there.   Her husbands's family live nearby   Right handed.   Social Drivers of Corporate investment banker Strain: Low Risk  (08/13/2023)   Overall Financial Resource Strain (CARDIA)    Difficulty of Paying Living Expenses: Not hard at all  Food Insecurity: No Food Insecurity (08/24/2023)   Hunger Vital Sign    Worried About Running Out of Food in the Last Year: Never true    Ran Out of Food in the Last Year: Never true  Transportation Needs: No Transportation Needs (08/24/2023)   PRAPARE - Administrator, Civil Service (Medical): No    Lack of Transportation (Non-Medical): No  Physical Activity: Insufficiently Active (08/13/2023)   Exercise Vital Sign    Days of Exercise per Week: 3 days    Minutes of Exercise per Session: 20 min  Stress: No Stress Concern Present (08/13/2023)   [employer] of Occupational Health - Occupational Stress Questionnaire    Feeling of Stress :  Only a little  Social  Connections: Moderately Integrated (08/19/2023)   Social Connection and Isolation Panel [NHANES]    Frequency of Communication with Friends and Family: More than three times a week    Frequency of Social Gatherings with Friends and Family: Once a week    Attends Religious Services: More than 4 times per year    Active Member of [employer] or Organizations: No    Attends Banker Meetings: Never    Marital Status: Married  Catering manager Violence: Not At Risk (08/24/2023)   Humiliation, Afraid, Rape, and Kick questionnaire    Fear of Current or Ex-Partner: No    Emotionally Abused: No    Physically Abused: No    Sexually Abused: No    Review of Systems   Gen: Denies any fever, chills, loss of appetite, change in weight or weight loss CV: Denies chest pain, heart palpitations, syncope, edema  Resp: Denies shortness of breath with rest, cough, wheezing, coughing up blood, and pleurisy. GI: +melena +nausea +rectal bleeding +abdominal pain  GU : Denies urinary burning, blood in urine, urinary frequency, and urinary incontinence. MS: Denies joint pain, limitation of movement, swelling, cramps, and atrophy.  Derm: Denies rash, itching, dry skin, hives. Psych: Denies depression, anxiety, memory loss, hallucinations, and confusion. Heme: Denies bruising or bleeding Neuro:  Denies any headaches, dizziness, paresthesias, shaking  Physical Exam   Vital Signs in last 24 hours: Temp:  [97.6 F (36.4 C)] 97.6 F (36.4 C) (05/07 1001) Pulse Rate:  [110-126] 120 (05/07 1212) Resp:  [20-24] 24 (05/07 1212) BP: (91-119)/(49-80) 91/49 (05/07 1200) SpO2:  [90 %-97 %] 90 % (05/07 1212) Weight:  [79.4 kg] 79.4 kg (05/07 0958)   General:   Alert,  Well-developed, well-nourished, pleasant and cooperative in NAD Head:  Normocephalic and atraumatic. Eyes:  Sclera clear, no icterus.   Conjunctiva pink. Ears:  Normal auditory acuity. Mouth:  No deformity or lesions,  dentition normal. Lungs:  Clear throughout to auscultation.   No wheezes, crackles, or rhonchi. No acute distress. Heart:  Regular rate and rhythm; no murmurs, clicks, rubs,  or gallops. Abdomen:  Soft, and nondistended. Abdomen is diffusely tender though worse in LUQ/LLQ. No masses, hepatosplenomegaly or hernias noted. Normal bowel sounds, without guarding, and without rebound.   Msk:  Symmetrical without gross deformities. Normal posture. Extremities:  Without clubbing or edema. Neurologic:  Alert and  oriented x4. Skin:  Intact without significant lesions or rashes. Psych:  Alert and cooperative. Normal mood and affect.   Labs/Studies   Recent Labs Recent Labs    10/07/23 1037  WBC 8.7  HGB 12.1  HCT 36.5  PLT 227   BMET Recent Labs    10/07/23 1037  NA 130*  K 3.8  CL 92*  CO2 25  GLUCOSE 175*  BUN 31*  CREATININE 0.96  CALCIUM  9.6   LFT Recent Labs    10/07/23 1037  PROT 6.9  ALBUMIN  3.6  AST 24  ALT 17  ALKPHOS 67  BILITOT 0.8   PT/INR Recent Labs    10/07/23 1037  LABPROT 17.5*  INR 1.4*    Radiology/Studies CT ABDOMEN PELVIS WO CONTRAST Result Date: 10/07/2023 CLINICAL DATA:  Abdominal pain.  Rectal bleeding. EXAM: CT ABDOMEN AND PELVIS WITHOUT CONTRAST TECHNIQUE: Multidetector CT imaging of the abdomen and pelvis was performed following the standard protocol without IV contrast. RADIATION DOSE REDUCTION: This exam was performed according to the departmental dose-optimization program which includes automated exposure control, adjustment of the mA and/or kV according  to patient size and/or use of iterative reconstruction technique. COMPARISON:  CT abdomen pelvis dated 10/06/2023. FINDINGS: Evaluation of this exam is limited in the absence of intravenous contrast. Lower chest: The visualized lung bases are clear. There is mild cardiomegaly. Calcification of the mitral annulus. No intra-abdominal free air or free fluid. Hepatobiliary: The liver is  unremarkable. No biliary ductal dilatation. The gallbladder is unremarkable. Pancreas: Unremarkable. No pancreatic ductal dilatation or surrounding inflammatory changes. Spleen: Normal in size without focal abnormality. Adrenals/Urinary Tract: The adrenal glands are unremarkable. There is no hydronephrosis or nephrolithiasis on either side. The visualized ureters and urinary bladder appear unremarkable. Stomach/Bowel: There is sigmoid diverticulosis. Minimal perisigmoid haziness may be chronic or represent mild acute diverticulitis. Clinical correlation is recommended. No abscess or perforation. There is no bowel obstruction. The appendix is normal. Vascular/Lymphatic: Mild aortoiliac atherosclerotic disease. The IVC is unremarkable. No portal venous gas. There is no adenopathy. Reproductive: The uterus and ovaries are grossly remarkable. No suspicious adnexal masses. Other: Small fat containing right inguinal hernia. Musculoskeletal: Osteopenia with degenerative changes spine. No acute osseous pathology. IMPRESSION: 1. Sigmoid diverticulosis with minimal perisigmoid haziness may be chronic or represent mild acute diverticulitis. No abscess or perforation. 2. No hydronephrosis or nephrolithiasis. 3. Normal appendix. 4.  Aortic Atherosclerosis (ICD10-I70.0). Electronically Signed   By: Angus Bark M.D.   On: 10/07/2023 12:01   CT ABDOMEN PELVIS WO CONTRAST Result Date: 10/06/2023 CT ABDOMEN PELVIS WO CONTRAST 10/06/2023 5:31 PM CDT CLINICAL HISTORY: Female, 78 years old. Genital cancer, monitor. Pt c/o abdominal pain and rectal bleeding; grade 1 endometrioid endometrial cancer COMPARISON: PET/CT from 09/23/2023 PROCEDURE COMMENTS: CT of the abdomen and pelvis was performed without the administration of IV contrast. Oral contrast was not administered prior to the examination. CT was performed using one or more dose reduction techniques including automated exposure control, adjustment of the mA and/or kV according  to patient size, and/or use of iterative reconstruction technique. Unless otherwise stated, incidental findings identified in this report do not require routine follow-up. 3-D reconstructions if necessary were generated. FINDINGS: Lower thorax: Moderate degenerative changes within bilateral lung bases. No pleural or pericardial effusion. Heart is moderately enlarged in size. Liver and biliary tree: Liver is normal in morphology. No focal lesions are identified. No intra- or extrahepatic biliary dilatation. Gallbladder: Normal Spleen: Normal noncontrast CT appearance. Pancreas: Normal noncontrast CT appearance Adrenal glands: Normal noncontrast CT appearance. Kidneys and ureters: No hydronephrosis. No radiopaque renal calculi. Gastrointestinal tract: Visualized bowel demonstrate no evidence of wall thickening or obstruction. Normal appendix in the right lower quadrant. Scattered colonic diverticulosis without CT evidence of acute diverticulitis. Bladder: Normal. Pelvis Organs: Normal uterus. Incomplete evaluation of the endometrium in a patient with a reported history of endometrial cancer. No suspicious or worrisome adnexal mass. Peritoneal cavity: No free fluid or free air. Vasculature: Mild scattered atherosclerotic calcifications without abdominal aortic aneurysm. Lymph nodes: No pathologically enlarged lymphadenopathy within the abdomen or pelvis. Abdominal wall: Normal. Musculoskeletal: No suspicious osseous lesions. Mild multilevel degenerative changes of the lower lumbar spine. IMPRESSION: 1. No acute process identified within the abdomen. Specifically, no evidence of abnormal bowel wall thickening or obstruction. Scattered colonic diverticulosis without evidence of diverticulitis. 2. Incomplete evaluation of the endometrium in a patient with a reported history of endometrial cancer. 3. No pathologically enlarged lymphadenopathy or imaging findings of distant metastases identified in the abdomen and pelvis on  limited noncontrast CT. Electronically signed by: Edrie Gower MD 10/06/2023 08:20 PM EDT RP Workstation: [id number]  Assessment   ZAKIRA PAHNKE is a 78 y.o. year old female  with bipolar 1 disorder, CAD, CHF, chronic pain, DM, HTN, Herpes, NAFLD, OSA, osteopenia, RLS, pulmonary htn who presented to the ED with rectal bleeding, melena and heme positive stool. GI consulted for further evaluation  Rectal bleeding/melena/abdominal pain: rectal bleeding and melena off and on with melena since atleast late last year and rectal bleeding since September 2023, no EGD or colonoscopy since onset. Per patient currently awaiting cardiac clearance for EGD and Colonoscopy. Now with worsening abdominal pain and rectal bleeding. BP low in the ED and mildly tachycardic. She is on eliquis , last dose last night at 1030. Fortunately her hgb is stable, 12.1 on admission , 14.1 on repeat. Stool was heme positive. CT A/P with minimal perisigmoid haziness, chronic vs. Possible Acute mild diverticulitis, no abscess or perforation. She is currently on rocephin /flagyl . Notably she had a CT yesterday without contrast that did not show these findings. She has generalized abdominal tenderness. At this time, would recommend EGD and Colonoscopy this admission, will discuss timing with Dr. Riley Cheadle given possible diverticulitis and need for eliquis  washout (thurs or Friday?)  Indications, risks and benefits of procedure discussed in detail with patient. Patient verbalized understanding and is in agreement to proceed with EGD/Colonoscopy.  Plan / Recommendations   Hold eliquis  PPI BID Plan for EGD/Colonoscopy, discuss timing with Dr. Riley Cheadle  Monitor for overt GI bleeding Trend h&H, transfuse as needed 6. Continue rocephin /flagyl     10/07/2023, 12:23 PM  Mozella Rexrode L. Latajah Thuman, MSN, APRN, AGNP-C Adult-Gerontology Nurse Practitioner [facility] at [facility]

## 2023-10-07 NOTE — ED Provider Notes (Signed)
 [facility] AT [facility] Provider Note   CSN: [id number] Arrival date & time: 10/07/23  4696     History  Chief Complaint  Patient presents with   Rectal Bleeding    Amber Stephenson is a 78 y.o. female.  Patient is a 78 year old female who presents to the emergency department with a chief complaint of rectal bleeding and generalized abdominal discomfort.  She notes that she has been having rectal bleeding for approximate the past month on and off but notes that it began again at approximately 3 AM this morning.  She notes that she has had approximately 20 bright red bloody bowel movements.  Patient currently denies any associated nausea, vomiting, fever, chills, chest pain, shortness of breath.  She has had no associated dizziness, lightheadedness or syncope.   Rectal Bleeding Associated symptoms: abdominal pain        Home Medications Prior to Admission medications   Medication Sig Start Date End Date Taking? Authorizing Provider  albuterol  (VENTOLIN  HFA) 108 (90 Base) MCG/ACT inhaler Inhale 2 puffs into the lungs every 4 (four) hours as needed for wheezing or shortness of breath. 06/15/23   [name], MD  allopurinol  (ZYLOPRIM ) 100 MG tablet Take 100 mg by mouth daily. 10/22/22   [provider]  ANUCORT-HC  25 MG suppository Place 25 mg rectally daily. 09/28/23   [provider]  apixaban  (ELIQUIS ) 5 MG TABS tablet Take 1 tablet (5 mg total) by mouth 2 (two) times daily. 09/09/22   Jacqueline Matsu, MD  ARIPiprazole  (ABILIFY ) 2 MG tablet Take 2 mg by mouth at bedtime. 10/07/21   [provider]  atorvastatin  (LIPITOR) 80 MG tablet Take 1 tablet (80 mg total) by mouth daily. Patient taking differently: Take 80 mg by mouth every evening. 02/05/23 08/19/23  Von Grumbling, PA-C  clarithromycin  (BIAXIN ) 500 MG tablet Take 1 tablet (500 mg total) by mouth 2 (two) times daily. 10/06/23   [name], MD  clotrimazole  (MYCELEX ) 10  MG troche Take 10 mg by mouth in the morning and at bedtime. 05/28/23   [provider]  clotrimazole  (QC CLOTRIMAZOLE ) 1 % vaginal cream Place 1 Applicatorful vaginally at bedtime. 09/02/23   St Annice Kim, NP  Continuous Glucose Sensor (DEXCOM G7 SENSOR) MISC CHANGE SENSOR EVERY 10 DAYS 09/28/23   Shamleffer, Ibtehal Jaralla, MD  cyanocobalamin  (VITAMIN B12) 1000 MCG tablet Take 1 tablet (1,000 mcg total) by mouth daily. 09/04/22   Ghimire, Estil Heman, MD  denosumab  (PROLIA ) 60 MG/ML SOSY injection Inject 60 mg into the skin every 6 (six) months. 08/14/20   [name], MD  escitalopram  (LEXAPRO ) 10 MG tablet Take 10 mg by mouth daily.    [provider]  fluconazole  (DIFLUCAN ) 150 MG tablet Take 1 tablet (150 mg total) by mouth daily for 5 doses. 10/06/23 10/11/23  [name], MD  fluticasone -salmeterol (ADVAIR DISKUS) 500-50 MCG/ACT AEPB Inhale 1 puff into the lungs in the morning and at bedtime. 10/06/23   [name], MD  furosemide  (LASIX ) 40 MG tablet Take 40 mg by mouth 2 (two) times daily. 09/18/23   [provider]  hydrOXYzine  (VISTARIL ) 25 MG capsule Take 1 capsule (25 mg total) by mouth 3 (three) times daily. 09/29/23   [name], MD  Insulin  Disposable Pump (OMNIPOD 5 G7 PODS, GEN 5,) MISC 1 Device by Does not apply route every other day. 02/12/23   Shamleffer, Ibtehal Jaralla, MD  insulin  glargine (LANTUS  SOLOSTAR) 100 UNIT/ML Solostar  Pen Inject 30 Units into the skin 2 (two) times daily. 09/21/23   Shamleffer, Ibtehal Jaralla, MD  insulin  lispro (HUMALOG  KWIKPEN) 100 UNIT/ML KwikPen Max daily 45 units Patient taking differently: Inject 6-45 Units into the skin 3 (three) times daily. Max daily 45 units 05/22/23   Shamleffer, Julian Obey, MD  Insulin  Pen Needle 29G X MISC 1 Device by Does not apply route daily in the afternoon. 05/22/23   Shamleffer, Ibtehal Jaralla, MD  levocetirizine (XYZAL  ALLERGY 24HR) 5 MG tablet Take 1 tablet (5 mg  total) by mouth every evening. For itch 05/12/23   [name], MD  lidocaine  (XYLOCAINE ) 2 % solution Use as directed 15 mLs in the mouth or throat every 4 (four) hours as needed for mouth pain. 05/18/23   [name], MD  LORazepam  (ATIVAN ) 0.5 MG tablet Take 0.5-1 mg by mouth See admin instructions. Take 0.5 mg by mouth in the morning and 1 mg at bedtime    [provider]  losartan  (COZAAR ) 25 MG tablet Take 25 mg by mouth daily. 02/14/23   [provider]  MAGNESIUM  PO Take 1 tablet by mouth daily.    [provider]  meclizine  (ANTIVERT ) 25 MG tablet Take 1 tablet (25 mg total) by mouth 3 (three) times daily as needed for dizziness. 06/05/23   Dettinger, Lucio Sabin, MD  metFORMIN  (GLUCOPHAGE -XR) 500 MG 24 hr tablet Take 2 tablets (1,000 mg total) by mouth daily with breakfast. 05/22/23   Shamleffer, Julian Obey, MD  Methocarbamol  1000 MG TABS Take 1,000 mg by mouth every 6 (six) hours as needed. 08/31/23   Lovorn, Megan, MD  metoprolol  tartrate (LOPRESSOR ) 50 MG tablet Take 1 tablet (50 mg total) by mouth 2 (two) times daily. 01/06/23   Jacqueline Matsu, MD  moxifloxacin  (AVELOX ) 400 MG tablet Take 1 tablet (400 mg total) by mouth daily. Patient not taking: Reported on 10/06/2023 09/29/23   [name], MD  naloxone  (NARCAN ) nasal spray 4 mg/0.1 mL Place 1 spray into the nose daily as needed (for over dose). 08/08/21   [provider]  nystatin  (MYCOSTATIN /NYSTOP ) powder Apply 1 Application topically as needed (rash). 06/16/22   Shamleffer, Ibtehal Jaralla, MD  nystatin  ointment (MYCOSTATIN ) Apply 1 Application topically 2 (two) times daily as needed (for rashes). 04/06/23   Javan Messing, NP  omeprazole (PRILOSEC) 40 MG capsule Take 40 mg by mouth daily.    [provider]  ondansetron  (ZOFRAN ) 4 MG tablet Take 1 tablet (4 mg total) by mouth every 8 (eight) hours as needed for nausea or vomiting. For cancer related nausea 02/16/23   Lovorn, Megan,  MD  OXcarbazepine  (TRILEPTAL ) 150 MG tablet Take 1 tablet (150 mg total) by mouth 2 (two) times daily. 06/13/22   Aura Leeds Latif, DO  Oxycodone  HCl 20 MG TABS Take 1 tablet (20 mg total) by mouth 2 (two) times daily as needed. Do Not Fill Before 10/05/23 08/31/23   Lovorn, Megan, MD  potassium chloride  (KLOR-CON ) 10 MEQ tablet Take 1 tablet (10 mEq total) by mouth daily. 02/05/23 08/19/23  Von Grumbling, PA-C  predniSONE  (DELTASONE ) 10 MG tablet Take 5 daily for 2 days followed by 4,3,2 and 1 for 2 days each. 09/29/23   [name], MD  pregabalin  (LYRICA ) 200 MG capsule Take 200 mg by mouth 2 (two) times daily.    [provider]  promethazine  (PHENERGAN ) 25 MG tablet Take 0.5-1 tablets (12.5-25 mg total) by mouth every 6 (six) hours as  needed for refractory nausea / vomiting (cancer related nausea/vomiting). 02/16/23   Lovorn, Megan, MD  rOPINIRole  (REQUIP ) 1 MG tablet Take 1 tablet (1 mg total) by mouth at bedtime. For leg cramps 09/07/23   [name], MD  senna-docusate (SENOKOT-S) 8.6-50 MG tablet Take 1 tablet by mouth at bedtime. 06/13/22   Aura Leeds Latif, DO  spironolactone  (ALDACTONE ) 25 MG tablet Take 0.5 tablets (12.5 mg total) by mouth daily. 01/31/23 06/11/23  Verlyn Goad, MD  Suvorexant  (BELSOMRA ) 20 MG TABS Take 1 tablet (20 mg total) by mouth at bedtime as needed. Patient taking differently: Take 20 mg by mouth at bedtime as needed (for sleep). 08/13/23   [name], MD  tirzepatide  (MOUNJARO ) 12.5 MG/0.5ML Pen Inject 12.5 mg into the skin once a week. 09/21/23   Shamleffer, Ibtehal Jaralla, MD  Vitamin D , Cholecalciferol , 25 MCG (1000 UT) TABS Take 1,000 Units by mouth daily in the afternoon. 07/26/20   [provider]      Allergies    Iodine, Ivp dye [iodinated contrast media], Latex, Tape, and Tizanidine     Review of Systems   Review of Systems  Gastrointestinal:  Positive for abdominal pain, anal bleeding, blood in stool and hematochezia.  All  other systems reviewed and are negative.   Physical Exam Updated Vital Signs BP 113/70   Pulse (!) 110   Temp 97.6 F (36.4 C) (Oral)   Ht 5\' 2"  (1.575 m)   Wt 79.4 kg   SpO2 97%   BMI 32.01 kg/m  Physical Exam Vitals and nursing note reviewed. Exam conducted with a chaperone present.  Constitutional:      Appearance: Normal appearance.  HENT:     Head: Normocephalic and atraumatic.     Nose: Nose normal.     Mouth/Throat:     Mouth: Mucous membranes are moist.  Eyes:     Extraocular Movements: Extraocular movements intact.     Conjunctiva/sclera: Conjunctivae normal.     Pupils: Pupils are equal, round, and reactive to light.  Cardiovascular:     Rate and Rhythm: Normal rate and regular rhythm.     Pulses: Normal pulses.     Heart sounds: Normal heart sounds.  Pulmonary:     Effort: Pulmonary effort is normal. No respiratory distress.     Breath sounds: Normal breath sounds. No stridor. No wheezing, rhonchi or rales.  Abdominal:     General: Abdomen is flat. Bowel sounds are normal. There is no distension.     Palpations: Abdomen is soft.     Tenderness: There is no guarding.     Comments: Diffuse tenderness on exam  Genitourinary:    Rectum: Guaiac result positive.     Comments: Rectal exam performed with nurse chaperone, bright red blood in the rectal vault with no stool, small external hemorrhoids that are not actively bleeding, no internal masses appreciated Musculoskeletal:        General: Normal range of motion.     Cervical back: Normal range of motion and neck supple.  Skin:    General: Skin is warm and dry.     Findings: No bruising or rash.  Neurological:     General: No focal deficit present.     Mental Status: She is alert and oriented to person, place, and time. Mental status is at baseline.  Psychiatric:        Mood and Affect: Mood normal.        Behavior: Behavior normal.  Thought Content: Thought content normal.        Judgment: Judgment  normal.     ED Results / Procedures / Treatments   Labs (all labs ordered are listed, but only abnormal results are displayed) Labs Reviewed  POC OCCULT BLOOD, ED - Abnormal; Notable for the following components:      Result Value   Fecal Occult Bld POSITIVE (*)    All other components within normal limits  COMPREHENSIVE METABOLIC PANEL WITH GFR  CBC WITH DIFFERENTIAL/PLATELET  PROTIME-INR  URINALYSIS, ROUTINE W REFLEX MICROSCOPIC  APTT  LIPASE, BLOOD  LACTIC ACID, PLASMA  LACTIC ACID, PLASMA  TYPE AND SCREEN    EKG None  Radiology CT ABDOMEN PELVIS WO CONTRAST Result Date: 10/06/2023 CT ABDOMEN PELVIS WO CONTRAST 10/06/2023 5:31 PM CDT CLINICAL HISTORY: Female, 78 years old. Genital cancer, monitor. Pt c/o abdominal pain and rectal bleeding; grade 1 endometrioid endometrial cancer COMPARISON: PET/CT from 09/23/2023 PROCEDURE COMMENTS: CT of the abdomen and pelvis was performed without the administration of IV contrast. Oral contrast was not administered prior to the examination. CT was performed using one or more dose reduction techniques including automated exposure control, adjustment of the mA and/or kV according to patient size, and/or use of iterative reconstruction technique. Unless otherwise stated, incidental findings identified in this report do not require routine follow-up. 3-D reconstructions if necessary were generated. FINDINGS: Lower thorax: Moderate degenerative changes within bilateral lung bases. No pleural or pericardial effusion. Heart is moderately enlarged in size. Liver and biliary tree: Liver is normal in morphology. No focal lesions are identified. No intra- or extrahepatic biliary dilatation. Gallbladder: Normal Spleen: Normal noncontrast CT appearance. Pancreas: Normal noncontrast CT appearance Adrenal glands: Normal noncontrast CT appearance. Kidneys and ureters: No hydronephrosis. No radiopaque renal calculi. Gastrointestinal tract: Visualized bowel demonstrate  no evidence of wall thickening or obstruction. Normal appendix in the right lower quadrant. Scattered colonic diverticulosis without CT evidence of acute diverticulitis. Bladder: Normal. Pelvis Organs: Normal uterus. Incomplete evaluation of the endometrium in a patient with a reported history of endometrial cancer. No suspicious or worrisome adnexal mass. Peritoneal cavity: No free fluid or free air. Vasculature: Mild scattered atherosclerotic calcifications without abdominal aortic aneurysm. Lymph nodes: No pathologically enlarged lymphadenopathy within the abdomen or pelvis. Abdominal wall: Normal. Musculoskeletal: No suspicious osseous lesions. Mild multilevel degenerative changes of the lower lumbar spine. IMPRESSION: 1. No acute process identified within the abdomen. Specifically, no evidence of abnormal bowel wall thickening or obstruction. Scattered colonic diverticulosis without evidence of diverticulitis. 2. Incomplete evaluation of the endometrium in a patient with a reported history of endometrial cancer. 3. No pathologically enlarged lymphadenopathy or imaging findings of distant metastases identified in the abdomen and pelvis on limited noncontrast CT. Electronically signed by: Edrie Gower MD 10/06/2023 08:20 PM EDT RP Workstation: [id number]    Procedures Procedures    Medications Ordered in ED Medications  HYDROmorphone  (DILAUDID ) injection 1 mg (has no administration in time range)  pantoprazole  (PROTONIX ) injection 40 mg (has no administration in time range)  ondansetron  (ZOFRAN ) injection 4 mg (has no administration in time range)  lactated ringers  bolus 500 mL (has no administration in time range)    ED Course/ Medical Decision Making/ A&P                                 Medical Decision Making Amount and/or Complexity of Data Reviewed Labs: ordered. Radiology: ordered.  Risk Prescription drug  management. Decision regarding hospitalization.   This patient presents  to the ED for concern of abdominal pain, rectal bleeding, this involves an extensive number of treatment options, and is a complaint that carries with it a high risk of complications and morbidity.  The differential diagnosis includes upper, lower GI bleed, diverticulosis, diverticulitis, mesenteric ischemia, pancreatitis, cholecystitis, small bowel obstruction, appendicitis, pyelonephritis   Co morbidities that complicate the patient evaluation  Chronic anticoagulation use   Additional history obtained:  Additional history obtained from medical records External records from outside source obtained and reviewed including medical records   Lab Tests:  I Ordered, and personally interpreted labs.  The pertinent results include: No leukocytosis, no anemia, normal kidney function liver function, hyponatremia, unremarkable urinalysis, negative lactic acid, negative troponin   Imaging Studies ordered:  I ordered imaging studies including CT scan of the abdomen and pelvis I independently visualized and interpreted imaging which showed diverticulosis with possible mild diverticulitis I agree with the radiologist interpretation   Cardiac Monitoring: / EKG:  The patient was maintained on a cardiac monitor.  I personally viewed and interpreted the cardiac monitored which showed an underlying rhythm of: Atrial fibrillation with RVR, no ST/T wave changes, no ischemic changes, no STEMI   Consultations Obtained:  I requested consultation with the hospitalist,  and discussed lab and imaging findings as well as pertinent plan - they recommend: Admission   Problem List / ED Course / Critical interventions / Medication management  Patient does remain stable at this time.  Discussed with patient we will plan for admission to the hospitalist service.  Did discuss patient case with Dr. Riley Cheadle with gastroenterology who will see the patient in consult.  CT scan of abdomen pelvis demonstrated possible  diverticulitis and have treated with Unasyn .  Vital signs are stable at this point and blood pressure has improved.  Did put in metoprolol  to help with her atrial fibrillation with RVR.  She has received IV fluids as well.  Discussed patient case with Dr. Bobbetta Burnet pulm service who has excepted at this time. I ordered medication including IV fluids, Unasyn , Dilaudid , Zofran , Protonix  for abdominal pain, GI bleed, diverticulitis Reevaluation of the patient after these medicines showed that the patient improved I have reviewed the patients home medicines and have made adjustments as needed   Social Determinants of Health:  None   Test / Admission - Considered:  Admission        Final Clinical Impression(s) / ED Diagnoses Final diagnoses:  None    Rx / DC Orders ED Discharge Orders     None         Emmalene Hare 10/07/23 1259    Ninetta Basket, MD 10/13/23 1134

## 2023-10-08 ENCOUNTER — Encounter (HOSPITAL_COMMUNITY): Payer: Self-pay | Admitting: Internal Medicine

## 2023-10-08 DIAGNOSIS — K5733 Diverticulitis of large intestine without perforation or abscess with bleeding: Secondary | ICD-10-CM | POA: Diagnosis present

## 2023-10-08 DIAGNOSIS — F419 Anxiety disorder, unspecified: Secondary | ICD-10-CM | POA: Diagnosis present

## 2023-10-08 DIAGNOSIS — D62 Acute posthemorrhagic anemia: Secondary | ICD-10-CM | POA: Diagnosis present

## 2023-10-08 DIAGNOSIS — G2581 Restless legs syndrome: Secondary | ICD-10-CM | POA: Diagnosis present

## 2023-10-08 DIAGNOSIS — E871 Hypo-osmolality and hyponatremia: Secondary | ICD-10-CM | POA: Diagnosis present

## 2023-10-08 DIAGNOSIS — Z794 Long term (current) use of insulin: Secondary | ICD-10-CM | POA: Diagnosis not present

## 2023-10-08 DIAGNOSIS — K760 Fatty (change of) liver, not elsewhere classified: Secondary | ICD-10-CM | POA: Diagnosis present

## 2023-10-08 DIAGNOSIS — K625 Hemorrhage of anus and rectum: Secondary | ICD-10-CM | POA: Diagnosis not present

## 2023-10-08 DIAGNOSIS — I959 Hypotension, unspecified: Secondary | ICD-10-CM | POA: Diagnosis present

## 2023-10-08 DIAGNOSIS — K649 Unspecified hemorrhoids: Secondary | ICD-10-CM | POA: Diagnosis present

## 2023-10-08 DIAGNOSIS — E1140 Type 2 diabetes mellitus with diabetic neuropathy, unspecified: Secondary | ICD-10-CM | POA: Diagnosis present

## 2023-10-08 DIAGNOSIS — E66811 Obesity, class 1: Secondary | ICD-10-CM | POA: Diagnosis present

## 2023-10-08 DIAGNOSIS — I272 Pulmonary hypertension, unspecified: Secondary | ICD-10-CM | POA: Diagnosis present

## 2023-10-08 DIAGNOSIS — G4733 Obstructive sleep apnea (adult) (pediatric): Secondary | ICD-10-CM | POA: Diagnosis present

## 2023-10-08 DIAGNOSIS — Z7901 Long term (current) use of anticoagulants: Secondary | ICD-10-CM | POA: Diagnosis not present

## 2023-10-08 DIAGNOSIS — G894 Chronic pain syndrome: Secondary | ICD-10-CM | POA: Diagnosis present

## 2023-10-08 DIAGNOSIS — F319 Bipolar disorder, unspecified: Secondary | ICD-10-CM | POA: Diagnosis present

## 2023-10-08 DIAGNOSIS — Z6832 Body mass index (BMI) 32.0-32.9, adult: Secondary | ICD-10-CM | POA: Diagnosis not present

## 2023-10-08 DIAGNOSIS — I5032 Chronic diastolic (congestive) heart failure: Secondary | ICD-10-CM | POA: Diagnosis present

## 2023-10-08 DIAGNOSIS — C541 Malignant neoplasm of endometrium: Secondary | ICD-10-CM | POA: Diagnosis present

## 2023-10-08 DIAGNOSIS — J9611 Chronic respiratory failure with hypoxia: Secondary | ICD-10-CM | POA: Diagnosis present

## 2023-10-08 DIAGNOSIS — I110 Hypertensive heart disease with heart failure: Secondary | ICD-10-CM | POA: Diagnosis present

## 2023-10-08 DIAGNOSIS — K5790 Diverticulosis of intestine, part unspecified, without perforation or abscess without bleeding: Secondary | ICD-10-CM | POA: Diagnosis present

## 2023-10-08 DIAGNOSIS — I4819 Other persistent atrial fibrillation: Secondary | ICD-10-CM | POA: Diagnosis present

## 2023-10-08 DIAGNOSIS — I2510 Atherosclerotic heart disease of native coronary artery without angina pectoris: Secondary | ICD-10-CM | POA: Diagnosis present

## 2023-10-08 DIAGNOSIS — E785 Hyperlipidemia, unspecified: Secondary | ICD-10-CM | POA: Diagnosis present

## 2023-10-08 LAB — CBC
HCT: 34.9 % — ABNORMAL LOW (ref 36.0–46.0)
Hemoglobin: 11.1 g/dL — ABNORMAL LOW (ref 12.0–15.0)
MCH: 29.4 pg (ref 26.0–34.0)
MCHC: 31.8 g/dL (ref 30.0–36.0)
MCV: 92.3 fL (ref 80.0–100.0)
Platelets: 206 10*3/uL (ref 150–400)
RBC: 3.78 MIL/uL — ABNORMAL LOW (ref 3.87–5.11)
RDW: 14.4 % (ref 11.5–15.5)
WBC: 7.9 10*3/uL (ref 4.0–10.5)
nRBC: 0 % (ref 0.0–0.2)

## 2023-10-08 LAB — BASIC METABOLIC PANEL WITH GFR
Anion gap: 6 (ref 5–15)
BUN: 23 mg/dL (ref 8–23)
CO2: 28 mmol/L (ref 22–32)
Calcium: 8.3 mg/dL — ABNORMAL LOW (ref 8.9–10.3)
Chloride: 102 mmol/L (ref 98–111)
Creatinine, Ser: 0.83 mg/dL (ref 0.44–1.00)
GFR, Estimated: >60 mL/min (ref >60)
Glucose, Bld: 83 mg/dL (ref 70–99)
Potassium: 4.2 mmol/L (ref 3.5–5.1)
Sodium: 136 mmol/L (ref 135–145)

## 2023-10-08 LAB — GLUCOSE, CAPILLARY
Glucose-Capillary: 103 mg/dL — ABNORMAL HIGH (ref 70–99)
Glucose-Capillary: 139 mg/dL — ABNORMAL HIGH (ref 70–99)
Glucose-Capillary: 79 mg/dL (ref 70–99)
Glucose-Capillary: 83 mg/dL (ref 70–99)

## 2023-10-08 LAB — HEMOGLOBIN AND HEMATOCRIT, BLOOD
HCT: 33.6 % — ABNORMAL LOW (ref 36.0–46.0)
HCT: 34 % — ABNORMAL LOW (ref 36.0–46.0)
HCT: 33.5 % — ABNORMAL LOW (ref 36.0–46.0)
Hemoglobin: 11 g/dL — ABNORMAL LOW (ref 12.0–15.0)
Hemoglobin: 10.5 g/dL — ABNORMAL LOW (ref 12.0–15.0)
Hemoglobin: 10.3 g/dL — ABNORMAL LOW (ref 12.0–15.0)

## 2023-10-08 MED ORDER — SODIUM CHLORIDE 0.9 % IV SOLN
2.0000 g | INTRAVENOUS | Status: DC
  Administered 2023-10-08: 2 g via INTRAVENOUS
  Filled 2023-10-08: qty 20

## 2023-10-08 MED ORDER — CHLORHEXIDINE GLUCONATE CLOTH 2 % EX PADS
6.0000 | MEDICATED_PAD | Freq: Every day | CUTANEOUS | Status: DC
  Administered 2023-10-08 – 2023-10-10 (×3): 6 via TOPICAL

## 2023-10-08 MED ORDER — OXYCODONE HCL 5 MG PO TABS
10.0000 mg | ORAL_TABLET | Freq: Two times a day (BID) | ORAL | Status: DC | PRN
  Administered 2023-10-08 – 2023-10-10 (×4): 10 mg via ORAL
  Filled 2023-10-08 (×4): qty 2

## 2023-10-08 NOTE — Progress Notes (Signed)
 PROGRESS NOTE Amber Stephenson  RUE:[id number] DOB: 01-19-1946 DOA: 10/07/2023 PCP: [name], MD  Brief Narrative/Hospital Course: 78 year old female with endometrial cancer-medically inoperable s/p IMRT and boost, HTN CHFpEF last 60-5% 01/14/2023-on Lasix  40, losartan , metoprolol , chronic hypoxic respiratory failure, A-fib on Eliquis , CAD, obesity, diabetes mellitus w/ HLD on long-term insulin /Mounjaro /metformin  mood disorder on Ativan /Abilify /Lexapro , Atarax  who follows up at GYN oncology comes to the ED for evaluation of recurrent rectal bleeding on and off for a month and but form 3 am today has been worsening and some generalized abdomen discomfort. She was having a persistent cough and seen by PCP 5/6 AND placed on clarithromycin, also seen by GYN oncology recently and had CT abdomen pelvis obtained on 5/6> no acute process, incomplete evaluation of endometrial, no pathologically enlarged endocrinopathy. In the ED, initial blood pressure 113> subsequently 99/49, tachycardic up to 126, tachypneic 24 Labs with hyponatremia 130 BUN 13 creatinine 0.9 troponin 48 lactic acid 1.6 hemoglobin 12.1, UA unremarkable, Hemoccult positive. CT abdomen pelvis w/o contrast>> sigmoid diverticulosis w/ minimal perisigmoid  haziness may be chronic or mild acute diverticulitis, no abscess, no hydronephrosis normal appendix Patient on Protonix , antiemetics and reselected 500 cc, Unasyn  and admission was requested BRBPR on rectal exam. GI was consulted and admitted.  Subjective: Seen examined this morning Alert oriented resting comfortably reports she slept deep last night Overnight BP stable 110/1 20s heart rate in 80s Hemoglobin down to 11.1  Assessment and plan:  Bright red blood Rectal bleeding Possible mild acute diverticulitis Hemoccult positive Mild hypotension/Soft BP on admission ABLA: W/ ongoing rectal bleeding for a month while on Eliquis  and hb downtrending and soft blood pressure on admission  admitted to stepdown unit . GI following closely continue to hold Eliquis , cont PPI twice daily Continue empiric Rocephin  and Flagyl  GI following and awaiting further recommendations  Trend H&H and transfuse if less than 7. Or if acute drop Recent Labs  Lab 10/07/23 1037 10/07/23 1520 10/07/23 2138 10/08/23 0349 10/08/23 0955  HGB 12.1 14.1 11.3* 11.1* 11.0*  HCT 36.5 43.2 34.2* 34.9* 33.6*    Hyponatremia: resolved   Endometrial cancer-medically inoperable s/p IMRT and boost,  last seen by GYN oncology on 5/5.  Had CT abdomen done in 5/6 no acute finding but w/o contrast-she will follow-up with GYN.   HTN CHFpEF last 60-5% 01/14/2023: PTA on Lasix  40, losartan , metoprolol - and currently on hold in setting of GI bleed soft BP.  On gentle IV fluid hydration , CLD   Chronic hypoxic respiratory failure: Cont home oxygen  maintain saturation at least 92%    A-fib w./w RVR CAD: Rate uncontrolled on admission now stable on metoprolol  p.o. Troponin negative no chest pain. Cont holding Eliquis  due to #1   Recent cough: She is on antibiotics from PCP 5/6 continue same as Rocephin .  Continue antitussives   Diabetes mellitus w/ HLD: on long-term insulin /Mounjaro /metformin -hold p.o. meds keep on lower long-acting insulin , SSI Recent Labs  Lab 10/07/23 2020 10/07/23 2348 10/08/23 0407 10/08/23 0731  GLUCAP 226* 103* 103* 79     Mood disorder On chronic pain meds- oxycodone : Cont home Ativan /Abilify /Lexapro , prn Atarax  and also oxycodone   bid as lower dose since patient reports deep sleep which is unusual last night    Class 1 obesity w. Body mass index is 32.01 kg/m.  Advise weight loss   .hosp DVT prophylaxis: SCDs Start: 10/07/23 1452 Code Status:   Code Status: Full Code Family Communication: plan of care discussed with patient at bedside. Patient status  is: Remains hospitalized because of severity of illness Level of care: Stepdown   Dispo: The patient is from:  home            Anticipated disposition: TBD Objective: Vitals last 24 hrs: Vitals:   10/08/23 0700 10/08/23 0800 10/08/23 0812 10/08/23 0900  BP: 121/79 (!) 146/74  (!) 106/56  Pulse: 89 (!) 111  91  Resp: 16 15  19   Temp:   97.7 F (36.5 C)   TempSrc:   Axillary   SpO2: 97% 98%    Weight:      Height:        Physical Examination: General exam: alert awake, older than stated age HEENT:Oral mucosa moist, Ear/Nose WNL grossly Respiratory system: Bilaterally clear BS, no use of accessory muscle Cardiovascular system: S1 & S2 +. Gastrointestinal system: Abdomen soft, mildly Tender lower abdomen,ND,BS+ Nervous System: Alert, awake, following commands. Extremities: LE edema neg, warm extremities Skin: No rashes,warm. MSK: Normal muscle bulk/tone.   Data Reviewed: I have personally reviewed following labs and imaging studies ( see epic result tab) CBC: Recent Labs  Lab 10/07/23 1037 10/07/23 1520 10/07/23 2138 10/08/23 0349 10/08/23 0955  WBC 8.7 10.0  --  7.9  --   NEUTROABS 6.5 7.6  --   --   --   HGB 12.1 14.1 11.3* 11.1* 11.0*  HCT 36.5 43.2 34.2* 34.9* 33.6*  MCV 91.7 91.7  --  92.3  --   PLT 227 211  --  206  --    CMP: Recent Labs  Lab 10/07/23 1037 10/07/23 1520 10/08/23 0349  NA 130* 136 136  K 3.8 3.8 4.2  CL 92* 95* 102  CO2 25 28 28   GLUCOSE 175* 103* 83  BUN 31* 29* 23  CREATININE 0.96 0.89 0.83  CALCIUM  9.6 9.6 8.3*   GFR: Estimated Creatinine Clearance: 56.3 mL/min (by C-G formula based on SCr of 0.83 mg/dL). Recent Labs  Lab 10/07/23 1037 10/07/23 1520  AST 24 19  ALT 17 16  ALKPHOS 67 66  BILITOT 0.8 0.9  PROT 6.9 7.5  ALBUMIN  3.6 4.1    Recent Labs  Lab 10/07/23 1037  LIPASE 46   No results for input(s): "AMMONIA" in the last 168 hours. Coagulation Profile:  Recent Labs  Lab 10/07/23 1037 10/07/23 1520  INR 1.4* 1.3*   Unresulted Labs (From admission, onward)     Start     Ordered   10/09/23 0400  Hemoglobin and  hematocrit, blood  Once,   TIMED        10/09/23 0400   10/08/23 0500  Basic metabolic panel with GFR  Daily,   R      10/07/23 1653   10/08/23 0500  CBC  Daily,   R      10/07/23 1653   10/07/23 1600  Hemoglobin and hematocrit, blood  Now then every 6 hours,   R (with TIMED occurrences)      10/07/23 1452           Antimicrobials/Microbiology: Anti-infectives (From admission, onward)    Start     Dose/Rate Route Frequency Ordered Stop   10/08/23 1500  cefTRIAXone  (ROCEPHIN ) 2 g in sodium chloride  0.9 % 100 mL IVPB        2 g 200 mL/hr over 30 Minutes Intravenous Every 24 hours 10/08/23 0916 10/10/23 1459   10/07/23 1800  metroNIDAZOLE  (FLAGYL ) IVPB 500 mg        500 mg 100 mL/hr over 60  Minutes Intravenous Every 12 hours 10/07/23 1654     10/07/23 1500  cefTRIAXone  (ROCEPHIN ) 1 g in sodium chloride  0.9 % 100 mL IVPB  Status:  Discontinued        1 g 200 mL/hr over 30 Minutes Intravenous Every 24 hours 10/07/23 1452 10/08/23 0916   10/07/23 1215  Ampicillin -Sulbactam (UNASYN ) 3 g in sodium chloride  0.9 % 100 mL IVPB        3 g 200 mL/hr over 30 Minutes Intravenous  Once 10/07/23 1207 10/07/23 1335         Component Value Date/Time   SDES URINE, CLEAN CATCH 08/19/2023 1836   SPECREQUEST NONE 08/19/2023 1836   CULT  08/19/2023 1836    NO GROWTH Performed at [facility], [address] [place], [place] [address]    REPTSTATUS 08/20/2023 FINAL 08/19/2023 1836     Medications reviewed:  Scheduled Meds:  Chlorhexidine  Gluconate Cloth  6 each Topical Daily   insulin  aspart  0-15 Units Subcutaneous Q4H   insulin  glargine-yfgn  12 Units Subcutaneous BID   LORazepam   0.5 mg Oral q AM   LORazepam   1 mg Oral QHS   metoprolol  tartrate  50 mg Oral BID   OXcarbazepine   150 mg Oral BID   pantoprazole  (PROTONIX ) IV  40 mg Intravenous Q12H   pregabalin   200 mg Oral BID   Continuous Infusions:  cefTRIAXone  (ROCEPHIN )  IV     metronidazole  500 mg (10/08/23 0511)     Lesa Rape, MD [facility] 10/08/2023, 10:27 AM

## 2023-10-08 NOTE — Progress Notes (Signed)
 Gastroenterology Progress Note   Referring Provider: No ref. provider found Primary Care Physician:  [name], MD Primary Gastroenterologist:  [name]  Patient ID: Amber Stephenson; [id number]; [birth year]   Subjective:    Sleepy this morning. Took ativan  and oxycodone  two hours ago. States her abdominal pain is improved. No BM since admission.  Objective:   Vital signs in last 24 hours: Temp:  [97.1 F (36.2 C)-98.7 F (37.1 C)] 97.7 F (36.5 C) (05/08 0812) Pulse Rate:  [71-126] 91 (05/08 0900) Resp:  [13-24] 19 (05/08 0900) BP: (91-146)/(49-98) 106/56 (05/08 0900) SpO2:  [88 %-98 %] 98 % (05/08 0800) Weight:  [79.4 kg] 79.4 kg (05/07 1502) Last BM Date : 10/07/23 General:  drowsy,  Well-developed, well-nourished, pleasant and cooperative in NAD Head:  Normocephalic and atraumatic. Eyes:  Sclera clear, no icterus.   Abdomen:  Soft,  nondistended.  Normal bowel sounds, without guarding, and without rebound.  Somewhat diffuse tenderness today, more left sided. Extremities:  Without clubbing, deformity or edema. Neurologic:  drowsy and oriented x4;  grossly normal neurologically. Skin:  Intact without significant lesions or rashes. Psych:  Alert and cooperative. Normal mood and affect.  Intake/Output from previous day: 05/07 0701 - 05/08 0700 In: 1235.7 [IV Piggyback:1235.7] Out: -  Intake/Output this shift: No intake/output data recorded.  Lab Results: CBC Recent Labs    10/07/23 1037 10/07/23 1520 10/07/23 2138 10/08/23 0349 10/08/23 0955  WBC 8.7 10.0  --  7.9  --   HGB 12.1 14.1 11.3* 11.1* 11.0*  HCT 36.5 43.2 34.2* 34.9* 33.6*  MCV 91.7 91.7  --  92.3  --   PLT 227 211  --  206  --    BMET Recent Labs    10/07/23 1037 10/07/23 1520 10/08/23 0349  NA 130* 136 136  K 3.8 3.8 4.2  CL 92* 95* 102  CO2 25 28 28   GLUCOSE 175* 103* 83  BUN 31* 29* 23  CREATININE 0.96 0.89 0.83  CALCIUM  9.6 9.6 8.3*   LFTs Recent Labs    10/07/23 1037  10/07/23 1520  BILITOT 0.8 0.9  ALKPHOS 67 66  AST 24 19  ALT 17 16  PROT 6.9 7.5  ALBUMIN  3.6 4.1   Recent Labs    10/07/23 1037  LIPASE 46   PT/INR Recent Labs    10/07/23 1037 10/07/23 1520  LABPROT 17.5* 15.9*  INR 1.4* 1.3*         Imaging Studies: DG Chest 2 View Result Date: 10/07/2023 CLINICAL DATA:  Cough. EXAM: CHEST - 2 VIEW COMPARISON:  Chest radiograph 09/29/2023 FINDINGS: Rotated to the left. Cardiomegaly. Mitral annular vascular calcifications. No large area pulmonary consolidation. No pleural effusion or pneumothorax. Thoracic spine degenerative changes. IMPRESSION: Cardiomegaly. No acute cardiopulmonary process. Electronically Signed   By: Jone Neither M.D.   On: 10/07/2023 16:12   CT ABDOMEN PELVIS WO CONTRAST Result Date: 10/07/2023 CLINICAL DATA:  Abdominal pain.  Rectal bleeding. EXAM: CT ABDOMEN AND PELVIS WITHOUT CONTRAST TECHNIQUE: Multidetector CT imaging of the abdomen and pelvis was performed following the standard protocol without IV contrast. RADIATION DOSE REDUCTION: This exam was performed according to the departmental dose-optimization program which includes automated exposure control, adjustment of the mA and/or kV according to patient size and/or use of iterative reconstruction technique. COMPARISON:  CT abdomen pelvis dated 10/06/2023. FINDINGS: Evaluation of this exam is limited in the absence of intravenous contrast. Lower chest: The visualized lung bases are clear. There is mild cardiomegaly.  Calcification of the mitral annulus. No intra-abdominal free air or free fluid. Hepatobiliary: The liver is unremarkable. No biliary ductal dilatation. The gallbladder is unremarkable. Pancreas: Unremarkable. No pancreatic ductal dilatation or surrounding inflammatory changes. Spleen: Normal in size without focal abnormality. Adrenals/Urinary Tract: The adrenal glands are unremarkable. There is no hydronephrosis or nephrolithiasis on either side. The visualized  ureters and urinary bladder appear unremarkable. Stomach/Bowel: There is sigmoid diverticulosis. Minimal perisigmoid haziness may be chronic or represent mild acute diverticulitis. Clinical correlation is recommended. No abscess or perforation. There is no bowel obstruction. The appendix is normal. Vascular/Lymphatic: Mild aortoiliac atherosclerotic disease. The IVC is unremarkable. No portal venous gas. There is no adenopathy. Reproductive: The uterus and ovaries are grossly remarkable. No suspicious adnexal masses. Other: Small fat containing right inguinal hernia. Musculoskeletal: Osteopenia with degenerative changes spine. No acute osseous pathology. IMPRESSION: 1. Sigmoid diverticulosis with minimal perisigmoid haziness may be chronic or represent mild acute diverticulitis. No abscess or perforation. 2. No hydronephrosis or nephrolithiasis. 3. Normal appendix. 4.  Aortic Atherosclerosis (ICD10-I70.0). Electronically Signed   By: Angus Bark M.D.   On: 10/07/2023 12:01   CT ABDOMEN PELVIS WO CONTRAST Result Date: 10/06/2023 CT ABDOMEN PELVIS WO CONTRAST 10/06/2023 5:31 PM CDT CLINICAL HISTORY: Female, 78 years old. Genital cancer, monitor. Pt c/o abdominal pain and rectal bleeding; grade 1 endometrioid endometrial cancer COMPARISON: PET/CT from 09/23/2023 PROCEDURE COMMENTS: CT of the abdomen and pelvis was performed without the administration of IV contrast. Oral contrast was not administered prior to the examination. CT was performed using one or more dose reduction techniques including automated exposure control, adjustment of the mA and/or kV according to patient size, and/or use of iterative reconstruction technique. Unless otherwise stated, incidental findings identified in this report do not require routine follow-up. 3-D reconstructions if necessary were generated. FINDINGS: Lower thorax: Moderate degenerative changes within bilateral lung bases. No pleural or pericardial effusion. Heart is  moderately enlarged in size. Liver and biliary tree: Liver is normal in morphology. No focal lesions are identified. No intra- or extrahepatic biliary dilatation. Gallbladder: Normal Spleen: Normal noncontrast CT appearance. Pancreas: Normal noncontrast CT appearance Adrenal glands: Normal noncontrast CT appearance. Kidneys and ureters: No hydronephrosis. No radiopaque renal calculi. Gastrointestinal tract: Visualized bowel demonstrate no evidence of wall thickening or obstruction. Normal appendix in the right lower quadrant. Scattered colonic diverticulosis without CT evidence of acute diverticulitis. Bladder: Normal. Pelvis Organs: Normal uterus. Incomplete evaluation of the endometrium in a patient with a reported history of endometrial cancer. No suspicious or worrisome adnexal mass. Peritoneal cavity: No free fluid or free air. Vasculature: Mild scattered atherosclerotic calcifications without abdominal aortic aneurysm. Lymph nodes: No pathologically enlarged lymphadenopathy within the abdomen or pelvis. Abdominal wall: Normal. Musculoskeletal: No suspicious osseous lesions. Mild multilevel degenerative changes of the lower lumbar spine. IMPRESSION: 1. No acute process identified within the abdomen. Specifically, no evidence of abnormal bowel wall thickening or obstruction. Scattered colonic diverticulosis without evidence of diverticulitis. 2. Incomplete evaluation of the endometrium in a patient with a reported history of endometrial cancer. 3. No pathologically enlarged lymphadenopathy or imaging findings of distant metastases identified in the abdomen and pelvis on limited noncontrast CT. Electronically signed by: Edrie Gower MD 10/06/2023 08:20 PM EDT RP Workstation: [id number]   DG Chest 2 View Result Date: 09/29/2023 CLINICAL DATA:  cough, dyspnea, COPD EXAM: CHEST - 2 VIEW COMPARISON:  Radiographs 08/19/2023 and 06/26/2023. Chest CTA 06/26/2023. Cardiac CT 09/23/2023. FINDINGS: The heart is  enlarged but stable. Prominent mitral annular  calcifications are noted. Stable chronic atelectasis or scarring at the left lung base. No edema, confluent airspace disease, pneumothorax or significant pleural effusion. Status post right shoulder reverse arthroplasty without evidence of acute osseous abnormality. IMPRESSION: No evidence of acute cardiopulmonary process. Stable cardiomegaly and chronic left basilar atelectasis or scarring. Electronically Signed   By: Elmon Hagedorn M.D.   On: 09/29/2023 11:21   NM PET CT CARDIAC PERFUSION MULTI W/ABSOLUTE BLOODFLOW Result Date: 09/23/2023   LV perfusion is normal. There is no evidence of ischemia. There is no evidence of infarction.   Rest left ventricular function is normal. Rest EF: 50%. Stress left ventricular function is normal. Stress EF: 51%. End diastolic cavity size is normal.   Myocardial blood flow was computed to be 0.66ml/g/min at rest and 1.66ml/g/min at stress. Global myocardial blood flow reserve was 2.52 and was normal.   Coronary calcium  was present on the attenuation correction CT images. Mild coronary calcifications were present. Coronary calcifications were present in the left anterior descending artery and left circumflex artery distribution(s).   The study is normal. The study is low risk. Electronically signed by Jackquelyn Mass, MD ___________________________________________________________________________ ___________ CLINICAL DATA:  This over-read does not include interpretation of cardiac or coronary anatomy or pathology. The interpretation by the cardiologist is attached. COMPARISON:  None Available. FINDINGS: Scout view is grossly unremarkable. Atherosclerotic calcification of the aorta. Enlarged pulmonic trunk. 9 mm low right paratracheal lymph node is not considered enlarged by CT size criteria. Review of the lungs is limited by expiratory phase imaging and respiratory motion. Calcified granulomas. IMPRESSION: 1. No acute extracardiac  findings. 2.  Aortic atherosclerosis (ICD10-I70.0). 3. Enlarged pulmonic trunk, indicative of pulmonary arterial hypertension. Electronically Signed   By: Shearon Denis M.D.   On: 09/23/2023 10:19 [2 weeks]  Assessment:    Amber Stephenson is a 78 y.o. year old female with endometrial cancer, medically inoperable, s/p IMRT, Afib, bipolar 1 disorder, CAD, CHF, chronic pain, DM, HTN, Herpes, NAFLD, OSA, osteopenia, RLS, pulmonary htn who presented to the ED with rectal bleeding, melena and heme positive stool. GI consulted for further evaluation   Rectal bleeding/melena/abdominal pain: rectal bleeding and melena off and on with melena since at least late last year and rectal bleeding since September 2023, no EGD or colonoscopy since onset. Per patient currently awaiting cardiac clearance for EGD and Colonoscopy with [facility]. Now with worsening abdominal pain and rectal bleeding.  She is on eliquis , last dose night of 5/6 at 1030. Fortunately her hgb is stable, 12.1 on admission, 14.1 on repeat, Hgb 11 today similar to 09/2023. Stool was heme positive. CT A/P with minimal perisigmoid haziness, chronic vs. Possible acute mild diverticulitis, no abscess or perforation. She is currently on rocephin /flagyl . Notably she had a CT 5/6 without contrast that did not show these findings. She has generalized abdominal tenderness but somewhat worse left sided.    Patient would benefit from EGD and Colonoscopy, timing not clear. Patient and husband prefer during this admission if possible due to delay in her work up thus far.  She does have abdominal pain and suggestion of inflammatory process about her sigmoid colon s/o possible diverticulitis.    Plan:   Continue antibiotics.  Continue clear liquids.  Continue to hold Eliquis .  Trend H/H. Continue PPI BID. To discuss with Dr. Mordechai April, regarding possibility of inpatient procedures (in setting of possible diverticulitis).    LOS: 0 days   Trudie Fuse. Verlie Glisson [facility] [phone] 5/8/202510:24  AM

## 2023-10-08 NOTE — Plan of Care (Signed)
  Problem: Education: Goal: Knowledge of General Education information will improve Description: Including pain rating scale, medication(s)/side effects and non-pharmacologic comfort measures Outcome: Progressing   Problem: Coping: Goal: Level of anxiety will decrease Outcome: Progressing   Problem: Pain Managment: Goal: General experience of comfort will improve and/or be controlled Outcome: Progressing   Problem: Safety: Goal: Ability to remain free from injury will improve Outcome: Progressing   Problem: Skin Integrity: Goal: Risk for impaired skin integrity will decrease Outcome: Progressing

## 2023-10-09 DIAGNOSIS — K625 Hemorrhage of anus and rectum: Secondary | ICD-10-CM | POA: Diagnosis not present

## 2023-10-09 LAB — HEMOGLOBIN AND HEMATOCRIT, BLOOD
HCT: 36.6 % (ref 36.0–46.0)
HCT: 35.3 % — ABNORMAL LOW (ref 36.0–46.0)
HCT: 36.4 % (ref 36.0–46.0)
Hemoglobin: 11.3 g/dL — ABNORMAL LOW (ref 12.0–15.0)
Hemoglobin: 11.4 g/dL — ABNORMAL LOW (ref 12.0–15.0)
Hemoglobin: 11.2 g/dL — ABNORMAL LOW (ref 12.0–15.0)

## 2023-10-09 LAB — CBC
HCT: 36.8 % (ref 36.0–46.0)
Hemoglobin: 11.7 g/dL — ABNORMAL LOW (ref 12.0–15.0)
MCH: 30 pg (ref 26.0–34.0)
MCHC: 31.8 g/dL (ref 30.0–36.0)
MCV: 94.4 fL (ref 80.0–100.0)
Platelets: 192 10*3/uL (ref 150–400)
RBC: 3.9 MIL/uL (ref 3.87–5.11)
RDW: 14.3 % (ref 11.5–15.5)
WBC: 7 10*3/uL (ref 4.0–10.5)
nRBC: 0 % (ref 0.0–0.2)

## 2023-10-09 LAB — GLUCOSE, CAPILLARY
Glucose-Capillary: 84 mg/dL (ref 70–99)
Glucose-Capillary: 106 mg/dL — ABNORMAL HIGH (ref 70–99)
Glucose-Capillary: 171 mg/dL — ABNORMAL HIGH (ref 70–99)
Glucose-Capillary: 242 mg/dL — ABNORMAL HIGH (ref 70–99)
Glucose-Capillary: 162 mg/dL — ABNORMAL HIGH (ref 70–99)

## 2023-10-09 LAB — BASIC METABOLIC PANEL WITH GFR
Anion gap: 6 (ref 5–15)
BUN: 13 mg/dL (ref 8–23)
CO2: 25 mmol/L (ref 22–32)
Calcium: 8.2 mg/dL — ABNORMAL LOW (ref 8.9–10.3)
Chloride: 104 mmol/L (ref 98–111)
Creatinine, Ser: 0.67 mg/dL (ref 0.44–1.00)
GFR, Estimated: >60 mL/min (ref >60)
Glucose, Bld: 70 mg/dL (ref 70–99)
Potassium: 4.1 mmol/L (ref 3.5–5.1)
Sodium: 135 mmol/L (ref 135–145)

## 2023-10-09 MED ORDER — MECLIZINE HCL 12.5 MG PO TABS: 25.0000 mg | ORAL_TABLET | Freq: Three times a day (TID) | ORAL | Status: DC | PRN

## 2023-10-09 MED ORDER — VITAMIN B-12 1000 MCG PO TABS
1000.0000 ug | ORAL_TABLET | Freq: Every day | ORAL | Status: DC
  Administered 2023-10-10: 1000 ug via ORAL
  Filled 2023-10-09: qty 1

## 2023-10-09 MED ORDER — SODIUM CHLORIDE 0.9 % IV SOLN
2.0000 g | INTRAVENOUS | Status: DC
  Administered 2023-10-09: 2 g via INTRAVENOUS
  Filled 2023-10-09: qty 20

## 2023-10-09 MED ORDER — FLUCONAZOLE 150 MG PO TABS
150.0000 mg | ORAL_TABLET | Freq: Every day | ORAL | Status: DC
  Administered 2023-10-09 – 2023-10-10 (×2): 150 mg via ORAL
  Filled 2023-10-09 (×2): qty 1

## 2023-10-09 MED ORDER — FLUTICASONE FUROATE-VILANTEROL 200-25 MCG/ACT IN AEPB
1.0000 | INHALATION_SPRAY | Freq: Every day | RESPIRATORY_TRACT | Status: DC
  Administered 2023-10-10: 1 via RESPIRATORY_TRACT
  Filled 2023-10-09: qty 28

## 2023-10-09 MED ORDER — ESCITALOPRAM OXALATE 10 MG PO TABS
10.0000 mg | ORAL_TABLET | Freq: Every day | ORAL | Status: DC
  Administered 2023-10-10: 10 mg via ORAL
  Filled 2023-10-09: qty 1

## 2023-10-09 MED ORDER — PHENOL 1.4 % MT LIQD
1.0000 | OROMUCOSAL | Status: DC | PRN
  Administered 2023-10-09: 1 via OROMUCOSAL
  Filled 2023-10-09: qty 177

## 2023-10-09 MED ORDER — ARIPIPRAZOLE 2 MG PO TABS
2.0000 mg | ORAL_TABLET | Freq: Every day | ORAL | Status: DC
  Administered 2023-10-09: 2 mg via ORAL
  Filled 2023-10-09 (×2): qty 1

## 2023-10-09 MED ORDER — ROPINIROLE HCL 1 MG PO TABS
1.0000 mg | ORAL_TABLET | Freq: Every day | ORAL | Status: DC
  Administered 2023-10-09: 1 mg via ORAL
  Filled 2023-10-09: qty 1

## 2023-10-09 NOTE — Progress Notes (Signed)
 Patient had bowel movement. Formed, dark stool with red tinge. Patient states blood on toilet tissue when she wiped.

## 2023-10-09 NOTE — Progress Notes (Signed)
 Patient has been following patient for complaints of rectal bleeding, also abdominal pain.  Imaging this admission with CT revealing minimal perisigmoid haziness, possibly chronic but also could be acute mild diverticulitis without abscess or perforation.  She has been receiving IV antibiotics and has had improvement in her abdominal pain.  No leukocytosis.  Hemoglobin has been stable in the 10-11 range despite intermittent bleeding.  He has not uncommon to have an increase of rectal bleeding with diverticulitis.  Given the concern for diverticulitis, performing colonoscopy would be high risk for perforation.  Although she has been trying to have EGD and colonoscopy performed outpatient with [facility], there was reports of having difficulty with cardiac clearance.  Reviewed note within the chart from April that states cardiology has been okay with holding Eliquis  for 2 days prior to performing procedures.  Unsure if there is medical clearance that is pending.  She will need to follow-up with [facility] to let them know the recommendation for procedures in 6-8 weeks.  Advise hospitalist advance diet to soft and low fiber.  Continue PPI and can transition to p.o. antibiotics on discharge.  Relayed all these recommendations and findings with hospitalist today.  Did not physically see patient today.  GI will sign off.  Julian Obey, MSN, APRN, FNP-BC, AGACNP-BC [facility]

## 2023-10-09 NOTE — Progress Notes (Signed)
 PROGRESS NOTE Amber Stephenson  BMW:[id number] DOB: 1946-04-26 DOA: 10/07/2023 PCP: [name], MD  Brief Narrative/Hospital Course: 78 year old female with endometrial cancer-medically inoperable s/p IMRT and boost, HTN CHFpEF last 60-5% 01/14/2023-on Lasix  40, losartan , metoprolol , chronic hypoxic respiratory failure, A-fib on Eliquis , CAD, obesity, diabetes mellitus w/ HLD on long-term insulin /Mounjaro /metformin  mood disorder on Ativan /Abilify /Lexapro , Atarax  who follows up at GYN oncology comes to the ED for evaluation of recurrent rectal bleeding on and off for a month and but form 3 am today has been worsening and some generalized abdomen discomfort. She was having a persistent cough and seen by PCP 5/6 AND placed on clarithromycin , also seen by GYN oncology recently and had CT abdomen pelvis obtained on 5/6> no acute process, incomplete evaluation of endometrial, no pathologically enlarged endocrinopathy. In the ED, initial blood pressure 113> subsequently 99/49, tachycardic up to 126, tachypneic 24 Labs with hyponatremia 130 BUN 13 creatinine 0.9 troponin 48 lactic acid 1.6 hemoglobin 12.1, UA unremarkable, Hemoccult positive. CT abdomen pelvis w/o contrast>> sigmoid diverticulosis w/ minimal perisigmoid  haziness may be chronic or mild acute diverticulitis, no abscess, no hydronephrosis normal appendix Patient on Protonix , antiemetics and reselected 500 cc, Unasyn  and admission was requested BRBPR on rectal exam. GI was consulted and admitted, placed on ceftriaxone  and Flagyl  given concern for mild acute diverticulitis on imaging.  Subjective: Seen and examined On the bedside still having some abdominal pain, last BM 5/7 Trending up BP and afebrile overnight Labs reviewed hemoglobin improved to 11.7 WBC normal, BMP normal fairly Does not feel ready to go home yet Still on clears  Assessment and plan:  Bright red blood Rectal bleeding Possible mild acute diverticulitis Hemoccult  positive Mild hypotension/Soft BP on admission ABLA mild: W/ ongoing rectal bleeding for a month while on Eliquis  and hb downtrending and soft blood pressure on admission admitted to stepdown unit . GI following closely continue to hold Eliquis , cont PPI twice daily Continue empiric Rocephin  and Flagy due to concern for acute diverticulitis on CT scan  Hemoglobin trending up and stable, cont to trend. Await further GI plan- On CLD- ADAT per GI. Recent Labs  Lab 10/08/23 0349 10/08/23 0955 10/08/23 1551 10/08/23 2157 10/09/23 0612  HGB 11.1* 11.0* 10.5* 10.3* 11.7*  HCT 34.9* 33.6* 34.0* 33.5* 36.8    Hyponatremia: resolved   Endometrial cancer-medically inoperable s/p IMRT and boost,  last seen by GYN oncology on 5/5.  Had CT abdomen done in 5/6 no acute finding but w/o contrast-she will follow-up with GYN.   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   A-fib w./w RVR CAD: Rate uncontrolled on admission now stable on metoprolol  p.o. Troponin negative no chest pain. Cont holding Eliquis  due to #1 until cleared by GI   Recent cough: She is on antibiotics from PCP 5/6 continue same as Rocephin .  Continue antitussives   Diabetes mellitus w/ HLD: on long-term insulin /Mounjaro /metformin -hold p.o. meds keep on lower long-acting insulin , SSI Recent Labs  Lab 10/08/23 0731 10/08/23 1941 10/08/23 2349 10/09/23 0429 10/09/23 0733  GLUCAP 79 139* 83 84 106*     Mood disorder On chronic pain meds- oxycodone : Cont home Ativan /Abilify /Lexapro , prn Atarax  and also oxycodone   bid as lower dose since patient reports deep sleep which is  unusual last night    Class 1 obesity w. Body mass index is 32.01 kg/m.  Advise weight loss    .hosp DVT prophylaxis: SCDs Start: 10/07/23 1452 Code Status:   Code Status: Full Code Family Communication: plan of care discussed with patient at bedside. Patient status is: Remains hospitalized because of severity of illness Level of care: Stepdown   Dispo: The patient is from: home            Anticipated disposition: TBD, pending GI clearance Objective: Vitals last 24 hrs: Vitals:   10/09/23 0700 10/09/23 0800 10/09/23 0953 10/09/23 1000  BP: (!) 161/75  134/86 (!) 145/73  Pulse: 88  79 88  Resp: (!) 23  19 (!) 22  Temp:  97.7 F (36.5 C)    TempSrc:  Oral    SpO2: 95%  97% 100%  Weight:      Height:        Physical Examination: General exam: alert awake, oriented at baseline, older than stated age HEENT:Oral mucosa moist, Ear/Nose WNL grossly Respiratory system: Bilaterally clear BS,no use of accessory muscle Cardiovascular system: S1 & S2 +, No JVD. Gastrointestinal system: Abdomen soft, mild tenderness ,ND, BS+ Nervous System: Alert, awake, moving all extremities,and following commands. Extremities: LE edema neg,distal peripheral pulses palpable and warm.  Skin: No rashes,no icterus. MSK: Normal muscle bulk,tone, power   Data Reviewed: I have personally reviewed following labs and imaging studies ( see epic result tab) CBC: Recent Labs  Lab 10/07/23 1037 10/07/23 1520 10/07/23 2138 10/08/23 0349 10/08/23 0955 10/08/23 1551 10/08/23 2157 10/09/23 0612  WBC 8.7 10.0  --  7.9  --   --   --  7.0  NEUTROABS 6.5 7.6  --   --   --   --   --   --   HGB 12.1 14.1   < > 11.1* 11.0* 10.5* 10.3* 11.7*  HCT 36.5 43.2   < > 34.9* 33.6* 34.0* 33.5* 36.8  MCV 91.7 91.7  --  92.3  --   --   --  94.4  PLT 227 211  --  206  --   --   --  192   < > = values in this interval not displayed.   CMP: Recent Labs  Lab 10/07/23 1037 10/07/23 1520 10/08/23 0349 10/09/23 0612  NA 130* 136 136 135  K 3.8 3.8 4.2 4.1  CL 92* 95* 102 104  CO2 25 28 28 25   GLUCOSE 175*  103* 83 70  BUN 31* 29* 23 13  CREATININE 0.96 0.89 0.83 0.67  CALCIUM  9.6 9.6 8.3* 8.2*   GFR: Estimated Creatinine Clearance: 58.4 mL/min (by C-G formula based on SCr of 0.67 mg/dL). Recent Labs  Lab 10/07/23 1037 10/07/23 1520  AST 24 19  ALT 17 16  ALKPHOS 67 66  BILITOT 0.8 0.9  PROT 6.9 7.5  ALBUMIN  3.6 4.1    Recent Labs  Lab 10/07/23 1037  LIPASE 46   No results for input(s): "AMMONIA" in the last 168 hours. Coagulation Profile:  Recent Labs  Lab 10/07/23 1037 10/07/23 1520  INR 1.4* 1.3*   Unresulted Labs (From admission, onward)     Start     Ordered   10/10/23 0400  Hemoglobin and hematocrit, blood  Once,   TIMED        10/10/23 0400   10/08/23 0500  Basic metabolic panel with GFR  Daily,   R      10/07/23 1653  10/08/23 0500  CBC  Daily,   R      10/07/23 1653   10/07/23 1600  Hemoglobin and hematocrit, blood  Now then every 6 hours,   R      10/07/23 1452           Antimicrobials/Microbiology: Anti-infectives (From admission, onward)    Start     Dose/Rate Route Frequency Ordered Stop   10/08/23 1500  cefTRIAXone  (ROCEPHIN ) 2 g in sodium chloride  0.9 % 100 mL IVPB        2 g 200 mL/hr over 30 Minutes Intravenous Every 24 hours 10/08/23 0916 10/10/23 1459   10/07/23 1800  metroNIDAZOLE  (FLAGYL ) IVPB 500 mg        500 mg 100 mL/hr over 60 Minutes Intravenous Every 12 hours 10/07/23 1654     10/07/23 1500  cefTRIAXone  (ROCEPHIN ) 1 g in sodium chloride  0.9 % 100 mL IVPB  Status:  Discontinued        1 g 200 mL/hr over 30 Minutes Intravenous Every 24 hours 10/07/23 1452 10/08/23 0916   10/07/23 1215  Ampicillin -Sulbactam (UNASYN ) 3 g in sodium chloride  0.9 % 100 mL IVPB        3 g 200 mL/hr over 30 Minutes Intravenous  Once 10/07/23 1207 10/07/23 1335         Component Value Date/Time   SDES URINE, CLEAN CATCH 08/19/2023 1836   SPECREQUEST NONE 08/19/2023 1836   CULT  08/19/2023 1836    NO GROWTH Performed at [facility], [address] [place], [place] [address]    REPTSTATUS 08/20/2023 FINAL 08/19/2023 1836     Medications reviewed:  Scheduled Meds:  Chlorhexidine  Gluconate Cloth  6 each Topical Daily   insulin  aspart  0-15 Units Subcutaneous Q4H   insulin  glargine-yfgn  12 Units Subcutaneous BID   LORazepam   0.5 mg Oral q AM   LORazepam   1 mg Oral QHS   metoprolol  tartrate  50 mg Oral BID   OXcarbazepine   150 mg Oral BID   pantoprazole  (PROTONIX ) IV  40 mg Intravenous Q12H   pregabalin   200 mg Oral BID   Continuous Infusions:  cefTRIAXone  (ROCEPHIN )  IV Stopped (10/08/23 1446)   metronidazole  Stopped (10/09/23 0636)    Lesa Rape, MD [facility] 10/09/2023, 11:20 AM

## 2023-10-09 NOTE — TOC Initial Note (Signed)
 Transition of Care [facility]) - Initial/Assessment Note    Patient Details  Name: Amber Stephenson MRN: [id number] Date of Birth: 11/24/1945  [facility] CM/SW Contact:    Grandville Lax, LCSWA Phone Number: 10/09/2023, 10:42 AM  Clinical Narrative:                 Pt is high risk for readmission. CSW spoke with pt at bedside to complete assessment. Pt lives with her spouse. Pt is independent in completing ADLs. Pts spouse provides transportation when needed. Pt has had HH with [facility] in the past and is agreeable to this being set up if needed. Pt does not use any DME. Pt states she had home O2 with [employer] but they are picking it up today. If needed she will use them again. TOC to follow.   Expected Discharge Plan: Home w Home Health Services Barriers to Discharge: Continued Medical Work up   Patient Goals and CMS Choice Patient states their goals for this hospitalization and ongoing recovery are:: return home CMS Medicare.gov Compare Post Acute Care list provided to:: Patient Choice offered to / list presented to : Patient      Expected Discharge Plan and Services In-house Referral: Clinical Social Work Discharge Planning Services: CM Consult Post Acute Care Choice: Home Health Living arrangements for the past 2 months: Single Family Home                                      Prior Living Arrangements/Services Living arrangements for the past 2 months: Single Family Home Lives with:: Self Patient language and need for interpreter reviewed:: Yes Do you feel safe going back to the place where you live?: Yes      Need for Family Participation in Patient Care: No (Comment) Care giver support system in place?: Yes (comment)   Criminal Activity/Legal Involvement Pertinent to Current Situation/Hospitalization: No - Comment as needed  Activities of Daily Living   ADL Screening (condition at time of admission) Independently performs ADLs?: Yes (appropriate for  developmental age) Is the patient deaf or have difficulty hearing?: No Does the patient have difficulty seeing, even when wearing glasses/contacts?: No Does the patient have difficulty concentrating, remembering, or making decisions?: No  Permission Sought/Granted                  Emotional Assessment Appearance:: Appears stated age Attitude/Demeanor/Rapport: Engaged Affect (typically observed): Accepting Orientation: : Oriented to Self, Oriented to Place, Oriented to  Time, Oriented to Situation Alcohol  / Substance Use: Not Applicable Psych Involvement: No (comment)  Admission diagnosis:  Diverticulosis [K57.90] Rectal bleeding [K62.5] Lower GI bleed [K92.2] Atrial fibrillation with RVR (HCC) [I48.91] Patient Active Problem List   Diagnosis Date Noted   Rectal bleeding 10/07/2023   Nocturnal enuresis 08/25/2023   Hyponatremia 06/10/2023   RSV (respiratory syncytial virus infection) 06/10/2023   Abdominal pain 06/10/2023   Diarrhea 06/10/2023   Candidiasis of mouth 05/15/2023   Nausea and vomiting 02/16/2023   Type 2 diabetes mellitus with hyperglycemia, with long-term current use of insulin  (HCC) 02/12/2023   Long term (current) use of oral hypoglycemic drugs 02/12/2023   Acute heart failure (HCC) 01/28/2023   Pure hypercholesterolemia 01/14/2023   Primary hypertension 01/14/2023   Metabolic acidosis 01/12/2023   Endometrial cancer (HCC) 01/12/2023   Acute on chronic respiratory failure with hypoxia (HCC) 01/12/2023   Vulvar itching 12/22/2022  PMB (postmenopausal bleeding) 11/13/2022   Superficial fungus infection of skin 11/13/2022   Burning with urination 11/13/2022   UTI (urinary tract infection) 11/09/2022   Vitamin B12 deficiency 11/09/2022   Scalp hematoma, initial encounter 11/09/2022   Frequent falls 09/01/2022   Hypotension 09/01/2022   Renal insufficiency 08/08/2022   AKI (acute kidney injury) (HCC) 06/27/2022   Hemorrhoids 03/11/2022   Rectal  itching 03/11/2022   Microalbuminuria 01/21/2022   Tremor, essential 12/11/2021   Chronic bilateral low back pain with right-sided sciatica 07/19/2021   Vaginal itching 04/16/2021   Sensory ataxia 03/28/2021   Acute diastolic CHF (congestive heart failure) (HCC) 12/16/2020   Elevated troponin 12/14/2020   Chronic post-traumatic stress disorder (PTSD) 12/06/2020   Senile nuclear sclerosis 12/06/2020   Senile corneal changes 12/06/2020   Postmenopausal bleeding 12/06/2020   Acquired hammer toe 12/06/2020   Opioid dependence 12/06/2020   Hypermetropia 12/06/2020   Generalized weakness 12/06/2020   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   Generalized anxiety disorder 12/06/2020   Functional diarrhea 10/26/2020   Insulin  dependent type 2 diabetes mellitus (HCC) 09/24/2020   Chronic respiratory failure with hypoxia (HCC) 04/25/2020   Chronic constipation 04/25/2020   CAD (coronary artery disease)    Hypocalcemia    S/P shoulder replacement, 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   Mixed hyperlipidemia 05/20/2019   Vertigo 04/25/2019   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   OSA (obstructive sleep apnea) 02/24/2019   Acute on chronic hypoxic respiratory failure (HCC) 01/22/2019   Acute pulmonary edema (HCC) 01/21/2019   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   At risk for adverse drug event 07/06/2018   Non-alcoholic fatty liver disease 01/12/2018   Mild intermittent asthma 01/12/2018   Senile osteoporosis 12/15/2017   Atrial fibrillation with RVR (HCC) 05/19/2017   Migraine headache with aura 02/12/2016   Diabetic neuropathy (HCC) 02/06/2016   Bipolar 1 disorder (HCC) 10/04/2015   Major depressive disorder 10/03/2015   Herpes genitalis in women 07/16/2015   Long term current use of anticoagulant 05/30/2015   Chronic heart failure with preserved ejection fraction (HFpEF) (HCC) 05/30/2015   Essential hypertension    RLS (restless legs syndrome) 04/27/2015   Insomnia 01/23/2015   Chronic  atrial fibrillation with RVR (HCC) 01/04/2015   PCP:  [name], MD Pharmacy:   [facility] [phone] - [place], [place] - [address] AT [facility] OF [address] [place] [address] Phone: [phone] Fax: [phone]     Social Drivers of Health (SDOH) Social History: SDOH Screenings   Food Insecurity: No Food Insecurity (10/07/2023)  Housing: Low Risk  (10/07/2023)  Transportation Needs: No Transportation Needs (10/07/2023)  Utilities: Not At Risk (10/07/2023)  Alcohol  Screen: Low Risk  (08/13/2023)  Depression (PHQ2-9): Low Risk  (08/31/2023)  Financial Resource Strain: Low Risk  (08/13/2023)  Physical Activity: Insufficiently Active (08/13/2023)  Social Connections: Moderately Integrated (10/07/2023)  Stress: No Stress Concern Present (08/13/2023)  Tobacco Use: Low Risk  (10/08/2023)  Health Literacy: Adequate Health Literacy (08/13/2023)   SDOH Interventions:     Readmission Risk Interventions    10/09/2023   10:41 AM 06/12/2023   11:00 AM 01/13/2023   10:06 AM  Readmission Risk Prevention Plan  Transportation Screening Complete Complete Complete  Medication Review Ocean[employer]) Complete Complete   PCP or Specialist appointment within 3-5 days of discharge  Complete   HRI or Home Care Consult Complete Complete   SW Recovery Care/Counseling Consult Complete Complete   Palliative Care Screening Not Applicable Not Applicable  Skilled Nursing Facility Not Applicable Not Applicable

## 2023-10-10 DIAGNOSIS — K625 Hemorrhage of anus and rectum: Secondary | ICD-10-CM | POA: Diagnosis not present

## 2023-10-10 LAB — BASIC METABOLIC PANEL WITH GFR
Anion gap: 6 (ref 5–15)
BUN: 8 mg/dL (ref 8–23)
CO2: 25 mmol/L (ref 22–32)
Calcium: 8.4 mg/dL — ABNORMAL LOW (ref 8.9–10.3)
Chloride: 105 mmol/L (ref 98–111)
Creatinine, Ser: 0.79 mg/dL (ref 0.44–1.00)
GFR, Estimated: >60 mL/min (ref >60)
Glucose, Bld: 190 mg/dL — ABNORMAL HIGH (ref 70–99)
Potassium: 4.6 mmol/L (ref 3.5–5.1)
Sodium: 136 mmol/L (ref 135–145)

## 2023-10-10 LAB — HEMOGLOBIN AND HEMATOCRIT, BLOOD
HCT: 35 % — ABNORMAL LOW (ref 36.0–46.0)
Hemoglobin: 11.2 g/dL — ABNORMAL LOW (ref 12.0–15.0)

## 2023-10-10 LAB — CBC
HCT: 32.6 % — ABNORMAL LOW (ref 36.0–46.0)
Hemoglobin: 10.5 g/dL — ABNORMAL LOW (ref 12.0–15.0)
MCH: 30 pg (ref 26.0–34.0)
MCHC: 32.2 g/dL (ref 30.0–36.0)
MCV: 93.1 fL (ref 80.0–100.0)
Platelets: 169 10*3/uL (ref 150–400)
RBC: 3.5 MIL/uL — ABNORMAL LOW (ref 3.87–5.11)
RDW: 14.4 % (ref 11.5–15.5)
WBC: 6.5 10*3/uL (ref 4.0–10.5)
nRBC: 0 % (ref 0.0–0.2)

## 2023-10-10 LAB — GLUCOSE, CAPILLARY
Glucose-Capillary: 181 mg/dL — ABNORMAL HIGH (ref 70–99)
Glucose-Capillary: 199 mg/dL — ABNORMAL HIGH (ref 70–99)

## 2023-10-10 MED ORDER — AMOXICILLIN-POT CLAVULANATE 875-125 MG PO TABS: 1.0000 | ORAL_TABLET | Freq: Two times a day (BID) | ORAL | 0 refills | Status: AC

## 2023-10-10 NOTE — Plan of Care (Signed)
  Problem: Education: Goal: Knowledge of General Education information will improve Description: Including pain rating scale, medication(s)/side effects and non-pharmacologic comfort measures Outcome: Progressing   Problem: Health Behavior/Discharge Planning: Goal: Ability to manage health-related needs will improve Outcome: Progressing   Problem: Clinical Measurements: Goal: Ability to maintain clinical measurements within normal limits will improve Outcome: Progressing Goal: Will remain free from infection Outcome: Progressing Goal: Diagnostic test results will improve Outcome: Progressing Goal: Respiratory complications will improve Outcome: Progressing Goal: Cardiovascular complication will be avoided Outcome: Progressing   Problem: Activity: Goal: Risk for activity intolerance will decrease Outcome: Progressing   Problem: Nutrition: Goal: Adequate nutrition will be maintained Outcome: Progressing   Problem: Coping: Goal: Level of anxiety will decrease Outcome: Progressing   Problem: Elimination: Goal: Will not experience complications related to bowel motility Outcome: Progressing Goal: Will not experience complications related to urinary retention Outcome: Progressing   Problem: Pain Managment: Goal: General experience of comfort will improve and/or be controlled Outcome: Progressing   Problem: Safety: Goal: Ability to remain free from injury will improve Outcome: Progressing   Problem: Skin Integrity: Goal: Risk for impaired skin integrity will decrease Outcome: Progressing   Problem: Education: Goal: Knowledge of disease or condition will improve Outcome: Progressing Goal: Understanding of medication regimen will improve Outcome: Progressing Goal: Individualized Educational Video(s) Outcome: Progressing   Problem: Activity: Goal: Ability to tolerate increased activity will improve Outcome: Progressing   Problem: Cardiac: Goal: Ability to achieve  and maintain adequate cardiopulmonary perfusion will improve Outcome: Progressing   Problem: Health Behavior/Discharge Planning: Goal: Ability to safely manage health-related needs after discharge will improve Outcome: Progressing   Problem: Education: Goal: Ability to describe self-care measures that may prevent or decrease complications (Diabetes Survival Skills Education) will improve Outcome: Progressing Goal: Individualized Educational Video(s) Outcome: Progressing   Problem: Coping: Goal: Ability to adjust to condition or change in health will improve Outcome: Progressing   Problem: Fluid Volume: Goal: Ability to maintain a balanced intake and output will improve Outcome: Progressing   Problem: Health Behavior/Discharge Planning: Goal: Ability to identify and utilize available resources and services will improve Outcome: Progressing Goal: Ability to manage health-related needs will improve Outcome: Progressing   Problem: Metabolic: Goal: Ability to maintain appropriate glucose levels will improve Outcome: Progressing   Problem: Nutritional: Goal: Maintenance of adequate nutrition will improve Outcome: Progressing Goal: Progress toward achieving an optimal weight will improve Outcome: Progressing   Problem: Skin Integrity: Goal: Risk for impaired skin integrity will decrease Outcome: Progressing   Problem: Tissue Perfusion: Goal: Adequacy of tissue perfusion will improve Outcome: Progressing

## 2023-10-10 NOTE — Plan of Care (Signed)
  Problem: Education: Goal: Knowledge of General Education information will improve Description: Including pain rating scale, medication(s)/side effects and non-pharmacologic comfort measures Outcome: Adequate for Discharge   Problem: Health Behavior/Discharge Planning: Goal: Ability to manage health-related needs will improve Outcome: Adequate for Discharge   Problem: Clinical Measurements: Goal: Ability to maintain clinical measurements within normal limits will improve Outcome: Adequate for Discharge Goal: Will remain free from infection Outcome: Adequate for Discharge Goal: Diagnostic test results will improve Outcome: Adequate for Discharge Goal: Respiratory complications will improve Outcome: Adequate for Discharge Goal: Cardiovascular complication will be avoided Outcome: Adequate for Discharge   Problem: Activity: Goal: Risk for activity intolerance will decrease Outcome: Adequate for Discharge   Problem: Nutrition: Goal: Adequate nutrition will be maintained Outcome: Adequate for Discharge   Problem: Coping: Goal: Level of anxiety will decrease Outcome: Adequate for Discharge   Problem: Elimination: Goal: Will not experience complications related to bowel motility Outcome: Adequate for Discharge Goal: Will not experience complications related to urinary retention Outcome: Adequate for Discharge   Problem: Pain Managment: Goal: General experience of comfort will improve and/or be controlled Outcome: Adequate for Discharge   Problem: Safety: Goal: Ability to remain free from injury will improve Outcome: Adequate for Discharge   Problem: Skin Integrity: Goal: Risk for impaired skin integrity will decrease Outcome: Adequate for Discharge   Problem: Education: Goal: Knowledge of disease or condition will improve Outcome: Adequate for Discharge Goal: Understanding of medication regimen will improve Outcome: Adequate for Discharge Goal: Individualized  Educational Video(s) Outcome: Adequate for Discharge   Problem: Activity: Goal: Ability to tolerate increased activity will improve Outcome: Adequate for Discharge   Problem: Cardiac: Goal: Ability to achieve and maintain adequate cardiopulmonary perfusion will improve Outcome: Adequate for Discharge   Problem: Health Behavior/Discharge Planning: Goal: Ability to safely manage health-related needs after discharge will improve Outcome: Adequate for Discharge   Problem: Education: Goal: Ability to describe self-care measures that may prevent or decrease complications (Diabetes Survival Skills Education) will improve Outcome: Adequate for Discharge Goal: Individualized Educational Video(s) Outcome: Adequate for Discharge   Problem: Coping: Goal: Ability to adjust to condition or change in health will improve Outcome: Adequate for Discharge   Problem: Fluid Volume: Goal: Ability to maintain a balanced intake and output will improve Outcome: Adequate for Discharge   Problem: Health Behavior/Discharge Planning: Goal: Ability to identify and utilize available resources and services will improve Outcome: Adequate for Discharge Goal: Ability to manage health-related needs will improve Outcome: Adequate for Discharge   Problem: Metabolic: Goal: Ability to maintain appropriate glucose levels will improve Outcome: Adequate for Discharge   Problem: Nutritional: Goal: Maintenance of adequate nutrition will improve Outcome: Adequate for Discharge Goal: Progress toward achieving an optimal weight will improve Outcome: Adequate for Discharge   Problem: Skin Integrity: Goal: Risk for impaired skin integrity will decrease Outcome: Adequate for Discharge   Problem: Tissue Perfusion: Goal: Adequacy of tissue perfusion will improve Outcome: Adequate for Discharge

## 2023-10-10 NOTE — Discharge Summary (Signed)
 Physician Discharge Summary  Amber Stephenson NWG:[id number] DOB: 1945-06-10 DOA: 10/07/2023  PCP: [name], MD  Admit date: 10/07/2023 Discharge date: 10/10/23  Admitted From: Home Disposition: Home Recommendations for Outpatient Follow-up:  Follow up with PCP in 1 week Outpatient follow-up with GI in 2 to 3 weeks.  May need colonoscopy once diverticulitis resolves. Check CMP and CBC at follow-up Please follow up on the following pending results: None  Home Health: No need identified. Equipment/Devices: Patient has home oxygen .  Discharge Condition: Stable CODE STATUS: Full code  Follow-up Information     Stacks, Vallorie Gayer, MD. Schedule an appointment as soon as possible for a visit in 1 week(s).   Specialty: Family Medicine Contact information: [address] [place] [address] [phone]                 Hospital course 78 year old F with PMH endometrial cancer-medically inoperable s/p IMRT and boost, HFpEF, CAD, A-fib on Eliquis , DM-2, HTN, obesity, HLD, anxiety and depression referred to ED by PCP after she presented today with recurrent rectal bleed and abdominal pain, admitted with rectal bleed, sigmoid diverticulitis, hypotension and A-fib with RVR.    On arrival to ED, stable vitals but became slightly hypotensive with mild RVR.  Na 130.  Troponin 48.  Lactic acid 1.6.  Hgb 12.1.  UA without significant finding.  Hemoccult positive.  Noncontrast CT abdomen and pelvis suggested mild acute sigmoid diverticulitis without abscess.  Patient was started on Protonix , antiemetics, IV fluid and IV Unasyn  and admitted for rectal bleed in the setting of sigmoid diverticulitis.  GI consulted.  Antibiotics changed to ceftriaxone  and Flagyl .  Symptoms and improved and bleeding resolved with bowel rest and holding anticoagulation.  Eventually, she tolerated soft diet, and cleared for discharge by GI for outpatient follow-up.  Advised to hold Eliquis  for 5 to 7 days.  She is  discharged on p.o. Augmentin  for 8 more days complete treatment course.  She will follow-up with GI outpatient for colonoscopy once diverticulitis resolved.  She will continue soft low fiber residue diet.  She has been educated on this and provided with resources as well.  See individual problem list below for more.   Problems addressed during this hospitalization Bright red blood Rectal bleeding due to acute diverticulitis and hemorrhoid: Resolved. Acute sigmoid diverticulitis: Improved.  Tolerated soft low fiber residue diet. -Cleared for discharge by GI for outpatient follow-up -Discharged on p.o. Augmentin  for 8 more days to complete treatment course -Outpatient follow-up with GI for colonoscopy once diverticulitis resolves -Advised to hold Eliquis  for 4 to 5 days.  Discussed risk and benefits - Continue suppositories for hemorrhoid.  Acute blood loss anemia: Bleeding resolved.  H&H stable Recent Labs    10/08/23 0349 10/08/23 0955 10/08/23 1551 10/08/23 2157 10/09/23 0612 10/09/23 1219 10/09/23 1548 10/09/23 2125 10/10/23 0517 10/10/23 0647  HGB 11.1* 11.0* 10.5* 10.3* 11.7* 11.3* 11.4* 11.2* 10.5* 11.2*  - Management as above  Chronic HFpEF: Stable.  Appears euvolemic. -Continue home meds  Persistent A-fib with RVR: RVR resolved. -Hold Eliquis  for 4 to 5 days in the setting of GI bleed -Continue home metoprolol .   Endometrial cancer-medically inoperable s/p IMRT and boost colon last seen by GYN oncology on 5/5.  Had CT abdomen done in 5/6 no acute finding but w/o contrast-she will follow-up with GYN.   Essential hypertension: BP slightly elevated. - Resume home meds   Chronic hypoxic respiratory failure: Stable.  History of CAD: No anginal symptoms. -  Continue home meds   IDDM-2: A1c 6.7%. - Continue home medications  Mood disorder/chronic pain -Continue home meds  Class I obesity Body mass index is 32.02 kg/m.            Time spent 35  minutes  Vital signs Vitals:   10/10/23 0700 10/10/23 0732 10/10/23 0909 10/10/23 1050  BP: (!) 141/46 (!) 153/93    Pulse: 84 87 100   Temp:      Resp: 19 (!) 21    Height:      Weight:      SpO2: 96% 98%  99%  TempSrc:      BMI (Calculated):         Discharge exam  GENERAL: No apparent distress.  Nontoxic. HEENT: MMM.  Vision and hearing grossly intact.  NECK: Supple.  No apparent JVD.  RESP:  No IWOB.  Fair aeration bilaterally. CVS: Irregular rhythm.  Normal rate.  Heart sounds normal.  ABD/GI/GU: BS+. Abd soft, NTND.  MSK/EXT:  Moves extremities. No apparent deformity. No edema.  SKIN: no apparent skin lesion or wound NEURO: Awake and alert. Oriented appropriately.  No apparent focal neuro deficit. PSYCH: Calm. Normal affect.   Discharge Instructions Discharge Instructions     Diet - low sodium heart healthy   Complete by: As directed    Diet Carb Modified   Complete by: As directed    Discharge instructions   Complete by: As directed    It has been a pleasure taking care of you!  You were hospitalized due to rectal bleeding and possible diverticulitis.  You have been started on antibiotics for diverticulitis.  We are discharging you on these antibiotics to complete treatment course.  It is very important that you complete the whole course regardless of improvement.  Continue soft low fiber residue diet for the next 4 to 5 days and slowly increase fiber content after 1 week.  Recommend holding Eliquis  for 4 to 5 days.  Avoid any over-the-counter pain medication other than plain Tylenol .  Follow-up with your gastroenterologist outpatient.   See separate instruction about low fiber residue diet   Take care,   Increase activity slowly   Complete by: As directed       Allergies as of 10/10/2023       Reactions   Iodine Anaphylaxis   Ivp Dye [iodinated Contrast Media] Anaphylaxis, Swelling, Other (See Comments)   Throat closes   Latex Other (See Comments)    Latex tape pulls skin with it   Tape Other (See Comments)   Pulls off the skin, if latex   Tizanidine  Other (See Comments)   Weakness, goofy, bad dreams        Medication List     PAUSE taking these medications    apixaban  5 MG Tabs tablet Wait to take this until: Oct 14, 2023 Commonly known as: Eliquis  Take 1 tablet (5 mg total) by mouth 2 (two) times daily.       STOP taking these medications    clarithromycin  500 MG tablet Commonly known as: BIAXIN    moxifloxacin  400 MG tablet Commonly known as: AVELOX        TAKE these medications    amoxicillin -clavulanate 875-125 MG tablet Commonly known as: AUGMENTIN  Take 1 tablet by mouth 2 (two) times daily for 10 days.   Anucort-HC  25 MG suppository Generic drug: hydrocortisone  Place 25 mg rectally daily.   ARIPiprazole  2 MG tablet Commonly known as: ABILIFY  Take 2 mg by mouth at bedtime.  atorvastatin  80 MG tablet Commonly known as: LIPITOR Take 1 tablet (80 mg total) by mouth daily. What changed: when to take this   clotrimazole  10 MG troche Commonly known as: MYCELEX  Take 10 mg by mouth in the morning and at bedtime.   cyanocobalamin  1000 MCG tablet Commonly known as: VITAMIN B12 Take 1 tablet (1,000 mcg total) by mouth daily.   Dexcom G7 Sensor Misc CHANGE SENSOR EVERY 10 DAYS   escitalopram  10 MG tablet Commonly known as: LEXAPRO  Take 10 mg by mouth daily.   fluconazole  150 MG tablet Commonly known as: DIFLUCAN  Take 1 tablet (150 mg total) by mouth daily for 5 doses.   fluticasone -salmeterol 500-50 MCG/ACT Aepb Commonly known as: Advair Diskus Inhale 1 puff into the lungs in the morning and at bedtime.   furosemide  40 MG tablet Commonly known as: LASIX  Take 40 mg by mouth daily.   hydrOXYzine  25 MG capsule Commonly known as: VISTARIL  Take 1 capsule (25 mg total) by mouth 3 (three) times daily.   insulin  lispro 100 UNIT/ML KwikPen Commonly known as: HumaLOG  KwikPen Max daily 45  units What changed:  how much to take how to take this when to take this   Insulin  Pen Needle 29G X Misc 1 Device by Does not apply route daily in the afternoon.   Lantus  SoloStar 100 UNIT/ML Solostar Pen Generic drug: insulin  glargine Inject 30 Units into the skin 2 (two) times daily.   lidocaine  2 % solution Commonly known as: XYLOCAINE  Use as directed 15 mLs in the mouth or throat every 4 (four) hours as needed for mouth pain.   LORazepam  0.5 MG tablet Commonly known as: ATIVAN  Take 0.5-1 mg by mouth in the morning, at noon, and at bedtime. Take 0.5 mg by mouth in the morning, 0.5 mg at lunch, and 0.5 mg at bedtime   losartan  25 MG tablet Commonly known as: COZAAR  Take 25 mg by mouth daily.   MAGNESIUM  PO Take 1 tablet by mouth daily.   meclizine  25 MG tablet Commonly known as: ANTIVERT  Take 1 tablet (25 mg total) by mouth 3 (three) times daily as needed for dizziness.   metFORMIN  500 MG 24 hr tablet Commonly known as: GLUCOPHAGE -XR Take 2 tablets (1,000 mg total) by mouth daily with breakfast.   Methocarbamol  1000 MG Tabs Take 1,000 mg by mouth every 6 (six) hours as needed. What changed: reasons to take this   metoprolol  tartrate 50 MG tablet Commonly known as: LOPRESSOR  Take 1 tablet (50 mg total) by mouth 2 (two) times daily.   naloxone  4 MG/0.1ML Liqd nasal spray kit Commonly known as: NARCAN  Place 1 spray into the nose daily as needed (for over dose).   nystatin  powder Commonly known as: MYCOSTATIN /NYSTOP  Apply 1 Application topically as needed (rash).   nystatin  ointment Commonly known as: MYCOSTATIN  Apply 1 Application topically 2 (two) times daily as needed (for rashes).   omeprazole 40 MG capsule Commonly known as: PRILOSEC Take 40 mg by mouth daily.   Omnipod 5 G7 Pods (Gen 5) Misc 1 Device by Does not apply route every other day.   ondansetron  4 MG tablet Commonly known as: Zofran  Take 1 tablet (4 mg total) by mouth every 8 (eight)  hours as needed for nausea or vomiting. For cancer related nausea   OXcarbazepine  150 MG tablet Commonly known as: TRILEPTAL  Take 1 tablet (150 mg total) by mouth 2 (two) times daily.   Oxycodone  HCl 20 MG Tabs Take 1 tablet (20 mg  total) by mouth 2 (two) times daily as needed. Do Not Fill Before 10/05/23 What changed: when to take this   potassium chloride  10 MEQ tablet Commonly known as: KLOR-CON  Take 1 tablet (10 mEq total) by mouth daily.   pregabalin  200 MG capsule Commonly known as: LYRICA  Take 200 mg by mouth 2 (two) times daily.   Prolia  60 MG/ML Sosy injection Generic drug: denosumab  Inject 60 mg into the skin every 6 (six) months.   rOPINIRole  1 MG tablet Commonly known as: REQUIP  Take 1 tablet (1 mg total) by mouth at bedtime. For leg cramps   tirzepatide  12.5 MG/0.5ML Pen Commonly known as: MOUNJARO  Inject 12.5 mg into the skin once a week.   Vitamin D  (Cholecalciferol ) 25 MCG (1000 UT) Tabs Take 1,000 Units by mouth daily in the afternoon.        Consultations: Gastroenterology  Procedures/Studies:   DG Chest 2 View Result Date: 10/07/2023 CLINICAL DATA:  Cough. EXAM: CHEST - 2 VIEW COMPARISON:  Chest radiograph 09/29/2023 FINDINGS: Rotated to the left. Cardiomegaly. Mitral annular vascular calcifications. No large area pulmonary consolidation. No pleural effusion or pneumothorax. Thoracic spine degenerative changes. IMPRESSION: Cardiomegaly. No acute cardiopulmonary process. Electronically Signed   By: Jone Neither M.D.   On: 10/07/2023 16:12   CT ABDOMEN PELVIS WO CONTRAST Result Date: 10/07/2023 CLINICAL DATA:  Abdominal pain.  Rectal bleeding. EXAM: CT ABDOMEN AND PELVIS WITHOUT CONTRAST TECHNIQUE: Multidetector CT imaging of the abdomen and pelvis was performed following the standard protocol without IV contrast. RADIATION DOSE REDUCTION: This exam was performed according to the departmental dose-optimization program which includes automated exposure  control, adjustment of the mA and/or kV according to patient size and/or use of iterative reconstruction technique. COMPARISON:  CT abdomen pelvis dated 10/06/2023. FINDINGS: Evaluation of this exam is limited in the absence of intravenous contrast. Lower chest: The visualized lung bases are clear. There is mild cardiomegaly. Calcification of the mitral annulus. No intra-abdominal free air or free fluid. Hepatobiliary: The liver is unremarkable. No biliary ductal dilatation. The gallbladder is unremarkable. Pancreas: Unremarkable. No pancreatic ductal dilatation or surrounding inflammatory changes. Spleen: Normal in size without focal abnormality. Adrenals/Urinary Tract: The adrenal glands are unremarkable. There is no hydronephrosis or nephrolithiasis on either side. The visualized ureters and urinary bladder appear unremarkable. Stomach/Bowel: There is sigmoid diverticulosis. Minimal perisigmoid haziness may be chronic or represent mild acute diverticulitis. Clinical correlation is recommended. No abscess or perforation. There is no bowel obstruction. The appendix is normal. Vascular/Lymphatic: Mild aortoiliac atherosclerotic disease. The IVC is unremarkable. No portal venous gas. There is no adenopathy. Reproductive: The uterus and ovaries are grossly remarkable. No suspicious adnexal masses. Other: Small fat containing right inguinal hernia. Musculoskeletal: Osteopenia with degenerative changes spine. No acute osseous pathology. IMPRESSION: 1. Sigmoid diverticulosis with minimal perisigmoid haziness may be chronic or represent mild acute diverticulitis. No abscess or perforation. 2. No hydronephrosis or nephrolithiasis. 3. Normal appendix. 4.  Aortic Atherosclerosis (ICD10-I70.0). Electronically Signed   By: Angus Bark M.D.   On: 10/07/2023 12:01   CT ABDOMEN PELVIS WO CONTRAST Result Date: 10/06/2023 CT ABDOMEN PELVIS WO CONTRAST 10/06/2023 5:31 PM CDT CLINICAL HISTORY: Female, 78 years old. Genital  cancer, monitor. Pt c/o abdominal pain and rectal bleeding; grade 1 endometrioid endometrial cancer COMPARISON: PET/CT from 09/23/2023 PROCEDURE COMMENTS: CT of the abdomen and pelvis was performed without the administration of IV contrast. Oral contrast was not administered prior to the examination. CT was performed using one or more dose reduction techniques  including automated exposure control, adjustment of the mA and/or kV according to patient size, and/or use of iterative reconstruction technique. Unless otherwise stated, incidental findings identified in this report do not require routine follow-up. 3-D reconstructions if necessary were generated. FINDINGS: Lower thorax: Moderate degenerative changes within bilateral lung bases. No pleural or pericardial effusion. Heart is moderately enlarged in size. Liver and biliary tree: Liver is normal in morphology. No focal lesions are identified. No intra- or extrahepatic biliary dilatation. Gallbladder: Normal Spleen: Normal noncontrast CT appearance. Pancreas: Normal noncontrast CT appearance Adrenal glands: Normal noncontrast CT appearance. Kidneys and ureters: No hydronephrosis. No radiopaque renal calculi. Gastrointestinal tract: Visualized bowel demonstrate no evidence of wall thickening or obstruction. Normal appendix in the right lower quadrant. Scattered colonic diverticulosis without CT evidence of acute diverticulitis. Bladder: Normal. Pelvis Organs: Normal uterus. Incomplete evaluation of the endometrium in a patient with a reported history of endometrial cancer. No suspicious or worrisome adnexal mass. Peritoneal cavity: No free fluid or free air. Vasculature: Mild scattered atherosclerotic calcifications without abdominal aortic aneurysm. Lymph nodes: No pathologically enlarged lymphadenopathy within the abdomen or pelvis. Abdominal wall: Normal. Musculoskeletal: No suspicious osseous lesions. Mild multilevel degenerative changes of the lower lumbar  spine. IMPRESSION: 1. No acute process identified within the abdomen. Specifically, no evidence of abnormal bowel wall thickening or obstruction. Scattered colonic diverticulosis without evidence of diverticulitis. 2. Incomplete evaluation of the endometrium in a patient with a reported history of endometrial cancer. 3. No pathologically enlarged lymphadenopathy or imaging findings of distant metastases identified in the abdomen and pelvis on limited noncontrast CT. Electronically signed by: Edrie Gower MD 10/06/2023 08:20 PM EDT RP Workstation: [id number]   DG Chest 2 View Result Date: 09/29/2023 CLINICAL DATA:  cough, dyspnea, COPD EXAM: CHEST - 2 VIEW COMPARISON:  Radiographs 08/19/2023 and 06/26/2023. Chest CTA 06/26/2023. Cardiac CT 09/23/2023. FINDINGS: The heart is enlarged but stable. Prominent mitral annular calcifications are noted. Stable chronic atelectasis or scarring at the left lung base. No edema, confluent airspace disease, pneumothorax or significant pleural effusion. Status post right shoulder reverse arthroplasty without evidence of acute osseous abnormality. IMPRESSION: No evidence of acute cardiopulmonary process. Stable cardiomegaly and chronic left basilar atelectasis or scarring. Electronically Signed   By: Elmon Hagedorn M.D.   On: 09/29/2023 11:21   NM PET CT CARDIAC PERFUSION MULTI W/ABSOLUTE BLOODFLOW Result Date: 09/23/2023   LV perfusion is normal. There is no evidence of ischemia. There is no evidence of infarction.   Rest left ventricular function is normal. Rest EF: 50%. Stress left ventricular function is normal. Stress EF: 51%. End diastolic cavity size is normal.   Myocardial blood flow was computed to be 0.66ml/g/min at rest and 1.66ml/g/min at stress. Global myocardial blood flow reserve was 2.52 and was normal.   Coronary calcium  was present on the attenuation correction CT images. Mild coronary calcifications were present. Coronary calcifications were present in the  left anterior descending artery and left circumflex artery distribution(s).   The study is normal. The study is low risk. Electronically signed by Jackquelyn Mass, MD ___________________________________________________________________________ ___________ CLINICAL DATA:  This over-read does not include interpretation of cardiac or coronary anatomy or pathology. The interpretation by the cardiologist is attached. COMPARISON:  None Available. FINDINGS: Scout view is grossly unremarkable. Atherosclerotic calcification of the aorta. Enlarged pulmonic trunk. 9 mm low right paratracheal lymph node is not considered enlarged by CT size criteria. Review of the lungs is limited by expiratory phase imaging and respiratory motion. Calcified granulomas. IMPRESSION: 1.  No acute extracardiac findings. 2.  Aortic atherosclerosis (ICD10-I70.0). 3. Enlarged pulmonic trunk, indicative of pulmonary arterial hypertension. Electronically Signed   By: Shearon Denis M.D.   On: 09/23/2023 10:19      The results of significant diagnostics from this hospitalization (including imaging, microbiology, ancillary and laboratory) are listed below for reference.     Microbiology: No results found for this or any previous visit (from the past 240 hours).   Labs:  CBC: Recent Labs  Lab 10/07/23 1037 10/07/23 1520 10/07/23 2138 10/08/23 0349 10/08/23 0955 10/09/23 0612 10/09/23 1219 10/09/23 1548 10/09/23 2125 10/10/23 0517 10/10/23 0647  WBC 8.7 10.0  --  7.9  --  7.0  --   --   --  6.5  --   NEUTROABS 6.5 7.6  --   --   --   --   --   --   --   --   --   HGB 12.1 14.1   < > 11.1*   < > 11.7* 11.3* 11.4* 11.2* 10.5* 11.2*  HCT 36.5 43.2   < > 34.9*   < > 36.8 36.6 35.3* 36.4 32.6* 35.0*  MCV 91.7 91.7  --  92.3  --  94.4  --   --   --  93.1  --   PLT 227 211  --  206  --  192  --   --   --  169  --    < > = values in this interval not displayed.   BMP &GFR Recent Labs  Lab 10/07/23 1037 10/07/23 1520  10/08/23 0349 10/09/23 0612 10/10/23 0517  NA 130* 136 136 135 136  K 3.8 3.8 4.2 4.1 4.6  CL 92* 95* 102 104 105  CO2 25 28 28 25 25   GLUCOSE 175* 103* 83 70 190*  BUN 31* 29* 23 13 8   CREATININE 0.96 0.89 0.83 0.67 0.79  CALCIUM  9.6 9.6 8.3* 8.2* 8.4*   Estimated Creatinine Clearance: 58.4 mL/min (by C-G formula based on SCr of 0.79 mg/dL). Liver & Pancreas: Recent Labs  Lab 10/07/23 1037 10/07/23 1520  AST 24 19  ALT 17 16  ALKPHOS 67 66  BILITOT 0.8 0.9  PROT 6.9 7.5  ALBUMIN  3.6 4.1   Recent Labs  Lab 10/07/23 1037  LIPASE 46   No results for input(s): "AMMONIA" in the last 168 hours. Diabetic: No results for input(s): "HGBA1C" in the last 72 hours. Recent Labs  Lab 10/09/23 1129 10/09/23 1604 10/09/23 2005 10/10/23 0017 10/10/23 0429  GLUCAP 171* 242* 162* 199* 181*   Cardiac Enzymes: No results for input(s): "CKTOTAL", "CKMB", "CKMBINDEX", "TROPONINI" in the last 168 hours. No results for input(s): "PROBNP" in the last 8760 hours. Coagulation Profile: Recent Labs  Lab 10/07/23 1037 10/07/23 1520  INR 1.4* 1.3*   Thyroid  Function Tests: No results for input(s): "TSH", "T4TOTAL", "FREET4", "T3FREE", "THYROIDAB" in the last 72 hours. Lipid Profile: No results for input(s): "CHOL", "HDL", "LDLCALC", "TRIG", "CHOLHDL", "LDLDIRECT" in the last 72 hours. Anemia Panel: No results for input(s): "VITAMINB12", "FOLATE", "FERRITIN", "TIBC", "IRON", "RETICCTPCT" in the last 72 hours. Urine analysis:    Component Value Date/Time   COLORURINE STRAW (A) 10/07/2023 1037   APPEARANCEUR CLEAR 10/07/2023 1037   APPEARANCEUR Clear 09/07/2023 1135   LABSPEC 1.005 10/07/2023 1037   PHURINE 7.0 10/07/2023 1037   GLUCOSEU NEGATIVE 10/07/2023 1037   HGBUR SMALL (A) 10/07/2023 1037   BILIRUBINUR NEGATIVE 10/07/2023 1037   BILIRUBINUR Negative 09/07/2023 1135  KETONESUR NEGATIVE 10/07/2023 1037   PROTEINUR NEGATIVE 10/07/2023 1037   UROBILINOGEN 0.2 07/23/2015  1350   NITRITE NEGATIVE 10/07/2023 1037   LEUKOCYTESUR NEGATIVE 10/07/2023 1037   Sepsis Labs: Invalid input(s): "PROCALCITONIN", "LACTICIDVEN"   SIGNED:  Theadore Finger, MD  [facility] 10/10/2023, 3:52 PM

## 2023-10-12 ENCOUNTER — Emergency Department (HOSPITAL_COMMUNITY)

## 2023-10-12 ENCOUNTER — Other Ambulatory Visit: Payer: Self-pay

## 2023-10-12 ENCOUNTER — Encounter (HOSPITAL_COMMUNITY): Payer: Self-pay | Admitting: *Deleted

## 2023-10-12 ENCOUNTER — Encounter: Payer: Self-pay | Admitting: Psychiatry

## 2023-10-12 ENCOUNTER — Ambulatory Visit: Payer: Self-pay

## 2023-10-12 ENCOUNTER — Emergency Department (HOSPITAL_COMMUNITY)
Admission: EM | Admit: 2023-10-12 | Discharge: 2023-10-12 | Disposition: A | Discharge status: Home / Self Care | Attending: Emergency Medicine | Admitting: Emergency Medicine

## 2023-10-12 ENCOUNTER — Encounter: Payer: Self-pay | Admitting: Family Medicine

## 2023-10-12 ENCOUNTER — Ambulatory Visit (INDEPENDENT_AMBULATORY_CARE_PROVIDER_SITE_OTHER): Admitting: Family Medicine

## 2023-10-12 ENCOUNTER — Telehealth: Payer: Self-pay | Admitting: *Deleted

## 2023-10-12 VITALS — BP 115/82 | HR 85 | Temp 98.5°F | Ht 62.0 in | Wt 182.0 lb

## 2023-10-12 DIAGNOSIS — Z91040 Latex allergy status: Secondary | ICD-10-CM | POA: Diagnosis not present

## 2023-10-12 DIAGNOSIS — Z794 Long term (current) use of insulin: Secondary | ICD-10-CM | POA: Diagnosis not present

## 2023-10-12 DIAGNOSIS — I5032 Chronic diastolic (congestive) heart failure: Secondary | ICD-10-CM | POA: Insufficient documentation

## 2023-10-12 DIAGNOSIS — R1084 Generalized abdominal pain: Secondary | ICD-10-CM | POA: Insufficient documentation

## 2023-10-12 DIAGNOSIS — Z7901 Long term (current) use of anticoagulants: Secondary | ICD-10-CM | POA: Insufficient documentation

## 2023-10-12 DIAGNOSIS — R197 Diarrhea, unspecified: Secondary | ICD-10-CM | POA: Diagnosis not present

## 2023-10-12 DIAGNOSIS — I110 Hypertensive heart disease with heart failure: Secondary | ICD-10-CM | POA: Diagnosis not present

## 2023-10-12 DIAGNOSIS — K5730 Diverticulosis of large intestine without perforation or abscess without bleeding: Secondary | ICD-10-CM | POA: Diagnosis not present

## 2023-10-12 DIAGNOSIS — I10 Essential (primary) hypertension: Secondary | ICD-10-CM

## 2023-10-12 DIAGNOSIS — R110 Nausea: Secondary | ICD-10-CM | POA: Insufficient documentation

## 2023-10-12 DIAGNOSIS — K4090 Unilateral inguinal hernia, without obstruction or gangrene, not specified as recurrent: Secondary | ICD-10-CM | POA: Diagnosis not present

## 2023-10-12 DIAGNOSIS — E1142 Type 2 diabetes mellitus with diabetic polyneuropathy: Secondary | ICD-10-CM | POA: Insufficient documentation

## 2023-10-12 DIAGNOSIS — Z7984 Long term (current) use of oral hypoglycemic drugs: Secondary | ICD-10-CM | POA: Insufficient documentation

## 2023-10-12 DIAGNOSIS — I2510 Atherosclerotic heart disease of native coronary artery without angina pectoris: Secondary | ICD-10-CM | POA: Diagnosis not present

## 2023-10-12 DIAGNOSIS — R109 Unspecified abdominal pain: Secondary | ICD-10-CM

## 2023-10-12 DIAGNOSIS — K625 Hemorrhage of anus and rectum: Secondary | ICD-10-CM

## 2023-10-12 DIAGNOSIS — Z79899 Other long term (current) drug therapy: Secondary | ICD-10-CM | POA: Insufficient documentation

## 2023-10-12 DIAGNOSIS — I4891 Unspecified atrial fibrillation: Secondary | ICD-10-CM | POA: Diagnosis not present

## 2023-10-12 DIAGNOSIS — Z8542 Personal history of malignant neoplasm of other parts of uterus: Secondary | ICD-10-CM | POA: Insufficient documentation

## 2023-10-12 LAB — COMPREHENSIVE METABOLIC PANEL WITH GFR
ALT: 13 U/L (ref 0–44)
AST: 20 U/L (ref 15–41)
Albumin: 3.4 g/dL — ABNORMAL LOW (ref 3.5–5.0)
Alkaline Phosphatase: 68 U/L (ref 38–126)
Anion gap: 8 (ref 5–15)
BUN: 8 mg/dL (ref 8–23)
CO2: 24 mmol/L (ref 22–32)
Calcium: 8.2 mg/dL — ABNORMAL LOW (ref 8.9–10.3)
Chloride: 101 mmol/L (ref 98–111)
Creatinine, Ser: 0.71 mg/dL (ref 0.44–1.00)
GFR, Estimated: >60 mL/min (ref >60)
Glucose, Bld: 215 mg/dL — ABNORMAL HIGH (ref 70–99)
Potassium: 3.8 mmol/L (ref 3.5–5.1)
Sodium: 133 mmol/L — ABNORMAL LOW (ref 135–145)
Total Bilirubin: 0.6 mg/dL (ref 0.0–1.2)
Total Protein: 6.8 g/dL (ref 6.5–8.1)

## 2023-10-12 LAB — LACTIC ACID, PLASMA
Lactic Acid, Venous: 1.6 mmol/L (ref 0.5–1.9)
Lactic Acid, Venous: 1.9 mmol/L (ref 0.5–1.9)

## 2023-10-12 LAB — URINALYSIS, ROUTINE W REFLEX MICROSCOPIC
Bilirubin Urine: NEGATIVE
Glucose, UA: NEGATIVE mg/dL
Hgb urine dipstick: NEGATIVE
Ketones, ur: NEGATIVE mg/dL
Leukocytes,Ua: NEGATIVE
Nitrite: NEGATIVE
Protein, ur: NEGATIVE mg/dL
Specific Gravity, Urine: 1.006 (ref 1.005–1.030)
pH: 7 (ref 5.0–8.0)

## 2023-10-12 LAB — CBC WITH DIFFERENTIAL/PLATELET
Abs Immature Granulocytes: 0.07 10*3/uL (ref 0.00–0.07)
Basophils Absolute: 0 10*3/uL (ref 0.0–0.1)
Basophils Relative: 0 %
Eosinophils Absolute: 0.1 10*3/uL (ref 0.0–0.5)
Eosinophils Relative: 2 %
HCT: 34.4 % — ABNORMAL LOW (ref 36.0–46.0)
Hemoglobin: 11.2 g/dL — ABNORMAL LOW (ref 12.0–15.0)
Immature Granulocytes: 1 %
Lymphocytes Relative: 10 %
Lymphs Abs: 0.6 10*3/uL — ABNORMAL LOW (ref 0.7–4.0)
MCH: 30.2 pg (ref 26.0–34.0)
MCHC: 32.6 g/dL (ref 30.0–36.0)
MCV: 92.7 fL (ref 80.0–100.0)
Monocytes Absolute: 0.5 10*3/uL (ref 0.1–1.0)
Monocytes Relative: 9 %
Neutro Abs: 4.9 10*3/uL (ref 1.7–7.7)
Neutrophils Relative %: 78 %
Platelets: 176 10*3/uL (ref 150–400)
RBC: 3.71 MIL/uL — ABNORMAL LOW (ref 3.87–5.11)
RDW: 14.6 % (ref 11.5–15.5)
WBC: 6.3 10*3/uL (ref 4.0–10.5)
nRBC: 0 % (ref 0.0–0.2)

## 2023-10-12 LAB — POC OCCULT BLOOD, ED: Fecal Occult Blood: NEGATIVE

## 2023-10-12 LAB — MAGNESIUM: Magnesium: 1.8 mg/dL (ref 1.7–2.4)

## 2023-10-12 MED ORDER — MORPHINE SULFATE (PF) 4 MG/ML IV SOLN
4.0000 mg | Freq: Once | INTRAVENOUS | Status: AC
  Administered 2023-10-12: 4 mg via INTRAVENOUS
  Filled 2023-10-12: qty 1

## 2023-10-12 MED ORDER — METRONIDAZOLE 500 MG PO TABS: 500.0000 mg | ORAL_TABLET | Freq: Three times a day (TID) | ORAL | 0 refills | Status: AC

## 2023-10-12 MED ORDER — ONDANSETRON HCL 4 MG PO TABS: 4.0000 mg | ORAL_TABLET | Freq: Three times a day (TID) | ORAL | 5 refills | Status: DC | PRN

## 2023-10-12 NOTE — ED Provider Notes (Signed)
 [facility] AT [facility] Provider Note   CSN: [id number] Arrival date & time: 10/12/23  1613     History  Chief Complaint  Patient presents with   Diarrhea    Amber Stephenson is a 78 y.o. female with endometrial cancer-medically inoperable s/p IMRT and boost, HFpEF, CAD, A-fib on Eliquis , DM-2, HTN, obesity, HLD, anxiety and depression who presents BIB [facility], recent admission for diverticulitis, dark diarrhea since yesterday. Recently admitted from 10/07/23-10/10/23 for recurrent rectal bleed and abdominal pain, admitted with rectal bleed, sigmoid diverticulitis, hypotension and A-fib with RVR.  Sxs improved and bleeding resolved, advised to hold eliquis  for 5-7 days, DC'd on augmentin , advised to f/u with GI for colonoscopy.  This weekend had significant explosive diarrhea that was dark yesterday, but has improved today. Not watery. But having ~15 BMs per day for several days. +nausea no vomiting. A/w significant abdominal pain diffuse. Sent to ED by Dr. Veleta Gerold.    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   Benign paroxysmal positional vertigo due to bilateral vestibular disorder 05/20/2019   Bipolar 1 disorder (HCC) 01/23/2015   with GAD, benzo dependence   CAD (coronary artery disease)    Nonobstructive; Managed by Dr. [name]   Cardiomegaly 01/12/2018   CHF (congestive heart failure) 06/08/2022   Chronic back pain 01/04/2015   Chronic constipation 04/25/2020   Chronic diastolic heart failure (HCC) 05/30/2015   Chronic pain syndrome 08/22/2019   back pain, sacroiliitis   Chronic post-traumatic stress disorder (PTSD) 12/06/2020   Diabetic neuropathy (HCC) 02/06/2016    Dyslipidemia 09/24/2020   Essential hypertension    Functional diarrhea 10/26/2020   Herpes genitalis in women 07/16/2015   History of adenomatous polyp of colon 05/21/2019   Overview:   03/31/17: Colonoscopy: nonadvanced adenoma, microscopic colitis, f/u 5 yrs, [facility]/GAP   History of radiation therapy    Endometrial- 02/18/23-03/31/23-Dr. Royston Cornea Kinard   Hypotension 09/01/2022   Insomnia 01/23/2015   Metabolic acidosis 01/12/2023   Migraine headache with aura 02/12/2016   Myofascial pain dysfunction syndrome 08/22/2019   Non-alcoholic fatty liver disease 01/12/2018   OSA (obstructive sleep apnea) 02/24/2019   10/09/2018 - HST  - AHI 40.6    Osteopenia 12/06/2020   Rx alendronate  35 mg.   Pinguecula 12/06/2020   Presbyopia 12/06/2020   Pulmonary hypertension    RLS (restless legs syndrome) 04/27/2015   RSV (respiratory syncytial virus infection) 06/10/2023   Tremor, essential 12/11/2021   Type II diabetes mellitus (HCC) 09/24/2020       Home Medications Prior to Admission medications   Medication Sig Start Date End Date Taking? Authorizing Provider  ANUCORT-HC  25 MG suppository Place 25 mg rectally daily. 09/28/23   [provider]  apixaban  (ELIQUIS ) 5 MG TABS tablet Take 1 tablet (5 mg total) by mouth 2 (two) times daily. 09/09/22   Jacqueline Matsu, MD  ARIPiprazole  (ABILIFY ) 2 MG tablet Take 2 mg by mouth at bedtime. 10/07/21   [provider]  atorvastatin  (LIPITOR) 80 MG tablet Take 1 tablet (80 mg total) by mouth daily. Patient taking differently: Take 80 mg by mouth every evening. 02/05/23 10/19/23  Von Grumbling, PA-C  budesonide  (ENTOCORT EC ) 3 MG 24 hr capsule Take 1 capsule (  3 mg total) by mouth 2 (two) times daily. 10/21/23   [name], MD  clotrimazole  (MYCELEX ) 10 MG troche Take 10 mg by mouth in the morning and at bedtime. 05/28/23   [provider]  colchicine  0.6 MG tablet Take 1 tablet (0.6 mg total) by mouth 2 (two) times daily. For  mouth sores 10/21/23   [name], MD  Continuous Glucose Sensor (DEXCOM G7 SENSOR) MISC CHANGE SENSOR EVERY 10 DAYS 09/28/23   Shamleffer, Julian Obey, MD  cyanocobalamin  (VITAMIN B12) 1000 MCG tablet Take 1 tablet (1,000 mcg total) by mouth daily. 09/04/22   Ghimire, Estil Heman, MD  denosumab  (PROLIA ) 60 MG/ML SOSY injection Inject 60 mg into the skin every 6 (six) months. 08/14/20   [name], MD  diphenoxylate -atropine  (LOMOTIL ) 2.5-0.025 MG tablet Take 2 tablets by mouth 4 (four) times daily as needed for diarrhea or loose stools. 10/21/23   [name], MD  escitalopram  (LEXAPRO ) 10 MG tablet Take 10 mg by mouth daily.    [provider]  fluticasone -salmeterol (ADVAIR DISKUS) 500-50 MCG/ACT AEPB Inhale 1 puff into the lungs in the morning and at bedtime. 10/06/23   [name], MD  furosemide  (LASIX ) 40 MG tablet Take 40 mg by mouth daily. 09/18/23   [provider]  hydrOXYzine  (VISTARIL ) 25 MG capsule Take 1 capsule (25 mg total) by mouth 3 (three) times daily. 09/29/23   [name], MD  insulin  glargine (LANTUS  SOLOSTAR) 100 UNIT/ML Solostar Pen Inject 30 Units into the skin 2 (two) times daily. 09/21/23   Shamleffer, Ibtehal Jaralla, MD  insulin  lispro (HUMALOG  KWIKPEN) 100 UNIT/ML KwikPen Max daily 45 units Patient taking differently: Inject 6-45 Units into the skin 3 (three) times daily. Max daily 45 units 05/22/23   Shamleffer, Julian Obey, MD  Insulin  Pen Needle 29G X MISC 1 Device by Does not apply route daily in the afternoon. 05/22/23   Shamleffer, Ibtehal Jaralla, MD  LORazepam  (ATIVAN ) 0.5 MG tablet Take 0.5-1 mg by mouth in the morning, at noon, and at bedtime. Take 0.5 mg by mouth in the morning, 0.5 mg at lunch, and 0.5 mg at bedtime    [provider]  losartan  (COZAAR ) 25 MG tablet Take 25 mg by mouth daily. 02/14/23   [provider]  MAGNESIUM  PO Take 1 tablet by mouth daily.    [provider]  meclizine   (ANTIVERT ) 25 MG tablet Take 1 tablet (25 mg total) by mouth 3 (three) times daily as needed for dizziness. 06/05/23   Dettinger, Lucio Sabin, MD  metFORMIN  (GLUCOPHAGE -XR) 500 MG 24 hr tablet Take 2 tablets (1,000 mg total) by mouth daily with breakfast. 05/22/23   Shamleffer, Julian Obey, MD  Methocarbamol  1000 MG TABS Take 1,000 mg by mouth every 6 (six) hours as needed. Patient taking differently: Take 1,000 mg by mouth every 6 (six) hours as needed (muscle spasms). 08/31/23   Lovorn, Megan, MD  metoprolol  tartrate (LOPRESSOR ) 50 MG tablet Take 1 tablet (50 mg total) by mouth 2 (two) times daily. 01/06/23   Jacqueline Matsu, MD  naloxone  (NARCAN ) nasal spray 4 mg/0.1 mL Place 1 spray into the nose daily as needed (for over dose). 08/08/21   [provider]  nystatin  (MYCOSTATIN /NYSTOP ) powder Apply 1 Application topically as needed (rash). 06/16/22   Shamleffer, Ibtehal Jaralla, MD  nystatin  ointment (MYCOSTATIN ) Apply 1 Application topically 2 (two) times daily as needed (for rashes). 04/06/23   Javan Messing, NP  omeprazole (PRILOSEC) 40 MG capsule  Take 40 mg by mouth daily.    [provider]  ondansetron  (ZOFRAN ) 4 MG tablet Take 1 tablet (4 mg total) by mouth every 8 (eight) hours as needed for nausea or vomiting. For cancer related nausea 10/12/23   Merdis Stalling, MD  OXcarbazepine  (TRILEPTAL ) 150 MG tablet Take 1 tablet (150 mg total) by mouth 2 (two) times daily. 06/13/22   Sheikh, Omair Latif, DO  Oxycodone  HCl 20 MG TABS Take 1 tablet (20 mg total) by mouth 2 (two) times daily as needed. Do Not Fill Before 10/05/23 Patient taking differently: Take 1 tablet by mouth in the morning and at bedtime. Do Not Fill Before 10/05/23 08/31/23   Lovorn, Megan, MD  potassium chloride  (KLOR-CON ) 10 MEQ tablet Take 1 tablet (10 mEq total) by mouth daily. 02/05/23 10/07/23  Von Grumbling, PA-C  pregabalin  (LYRICA ) 200 MG capsule Take 200 mg by mouth 2 (two) times daily.    [provider]   rOPINIRole  (REQUIP ) 1 MG tablet Take 1 tablet (1 mg total) by mouth at bedtime. For leg cramps 09/07/23   [name], MD  tirzepatide  (MOUNJARO ) 12.5 MG/0.5ML Pen Inject 12.5 mg into the skin once a week. 09/21/23   Shamleffer, Julian Obey, MD  Vitamin D , Cholecalciferol , 25 MCG (1000 UT) TABS Take 1,000 Units by mouth daily in the afternoon. 07/26/20   [provider]      Allergies    Iodine, Ivp dye [iodinated contrast media], Latex, Tape, and Tizanidine     Review of Systems   Review of Systems A 10 point review of systems was performed and is negative unless otherwise reported in HPI.  Physical Exam Updated Vital Signs BP 125/71   Pulse 80   Temp 97.7 F (36.5 C) (Oral)   Resp 20   Ht 5\' 2"  (1.575 m)   Wt 81.6 kg   SpO2 95%   BMI 32.92 kg/m  Physical Exam General: Normal appearing female, lying in bed.  HEENT: PERRLA, Sclera anicteric, MMM, trachea midline.  Cardiology: RRR, no murmurs/rubs/gallops.  Resp: Normal respiratory rate and effort. CTAB, no wheezes, rhonchi, crackles.  Abd: Soft, diffusely mildly tender, non-distended. No rebound tenderness or guarding.  GU: Deferred. MSK: No peripheral edema or signs of trauma. Extremities without deformity or TTP. No cyanosis or clubbing. Skin: warm, dry. Back: No CVA tenderness Neuro: A&Ox4, CNs II-XII grossly intact. MAEs. Sensation grossly intact.  Psych: Normal mood and affect.   ED Results / Procedures / Treatments   Labs (all labs ordered are listed, but only abnormal results are displayed) Labs Reviewed  CBC WITH DIFFERENTIAL/PLATELET - Abnormal; Notable for the following components:      Result Value   RBC 3.71 (*)    Hemoglobin 11.2 (*)    HCT 34.4 (*)    Lymphs Abs 0.6 (*)    All other components within normal limits  COMPREHENSIVE METABOLIC PANEL WITH GFR - Abnormal; Notable for the following components:   Sodium 133 (*)    Glucose, Bld 215 (*)    Calcium  8.2 (*)    Albumin  3.4 (*)    All  other components within normal limits  URINALYSIS, ROUTINE W REFLEX MICROSCOPIC - Abnormal; Notable for the following components:   Color, Urine STRAW (*)    All other components within normal limits  MAGNESIUM   LACTIC ACID, PLASMA  LACTIC ACID, PLASMA  POC OCCULT BLOOD, ED    EKG None  Radiology CT abd pelvis wo contrast: 1. No CT evidence  for acute intra-abdominal or pelvic abnormality. 2. Diverticular disease of the left colon without acute inflammatory process. 3. Cardiomegaly. 4. Aortic atherosclerosis.  Procedures Procedures    Medications Ordered in ED Medications  morphine  (PF) 4 MG/ML injection 4 mg (4 mg Intravenous Given 10/12/23 1845)    ED Course/ Medical Decision Making/ A&P                          Medical Decision Making Amount and/or Complexity of Data Reviewed Labs: ordered. Decision-making details documented in ED Course. Radiology: ordered. Decision-making details documented in ED Course.  Risk Prescription drug management.    This patient presents to the ED for concern of abdominal pain, now resolving diarrhea, this involves an extensive number of treatment options, and is a complaint that carries with it a high risk of complications and morbidity.  I considered the following differential and admission for this acute, potentially life threatening condition.   MDM:    For DDX for abdominal pain includes but is not limited to:  Abdominal exam without peritoneal signs. No evidence of acute abdomen at this time.    Low suspicion for acute hepatobiliary disease (including acute cholecystitis or cholangitis), acute pancreatitis (neg lipase), PUD (including gastric perforation), acute appendicitis, vascular catastrophe, bowel obstruction, or worsening sigmoid diverticulitis. She reports high volume diarrhea that subsequently resolved, hasn't had any diarrhea today, but given recent admission is at higher risk for C diff, will get stool studies if  patient is able to give sample. Also reports that her diarrhea is dark, will get fecal occult.    Clinical Course as of 10/22/23 1539  Mon Oct 12, 2023  1722 Hemoglobin(!): 11.2 Stable from 2d ago [HN]  2022 CT ABDOMEN PELVIS WO CONTRAST 1. No CT evidence for acute intra-abdominal or pelvic abnormality. 2. Diverticular disease of the left colon without acute inflammatory process. 3. Cardiomegaly. 4. Aortic atherosclerosis.   [HN]  2137 Fecal Occult Blood, POC: Negative [HN]  2137 Patient still reporting diffuse abdominal pain but states she would like to be discharged.  Has not had any diarrhea and her greater than 5-hour stay in the emergency department and CT abdomen pelvis does not demonstrate any further diverticulitis.  Offered patient further pain management for her abdominal pain but she would like to be discharged.  Reassuringly fecal occult is negative and her hemoglobin is stable.  Patient is advised to follow-up with Dr. Doretta Gant for outpatient stool studies as originally planned prior to sending her to the ER.  Instructed to continue taking Augmentin  as prescribed and continue holding Eliquis  as originally instructed.  Prescribed Zofran  for nausea.  Given discharge instructions and return precautions, all questions answered to patient satisfaction. [HN]    Clinical Course User Index [HN] Merdis Stalling, MD    Labs: I Ordered, and personally interpreted labs.  The pertinent results include:  those listed above  Imaging Studies ordered: I ordered imaging studies including CT abd pelvis wo contrast I independently visualized and interpreted imaging. I agree with the radiologist interpretation  Additional history obtained from chart review.   Reevaluation: After the interventions noted above, I reevaluated the patient and found that they have :improved  Social Determinants of Health: Lives independently  Disposition:  DC w/ discharge instructions/return precautions. All  questions answered to patient's satisfaction.    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   Benign paroxysmal positional vertigo due to bilateral vestibular disorder 05/20/2019   Bipolar 1 disorder (HCC) 01/23/2015   with GAD, benzo dependence   CAD (coronary artery disease)    Nonobstructive; Managed by Dr. [name]   Cardiomegaly 01/12/2018   CHF (congestive heart failure) 06/08/2022   Chronic back pain 01/04/2015   Chronic constipation 04/25/2020   Chronic diastolic heart failure (HCC) 05/30/2015   Chronic pain syndrome 08/22/2019   back pain, sacroiliitis   Chronic post-traumatic stress disorder (PTSD) 12/06/2020   Diabetic neuropathy (HCC) 02/06/2016   Dyslipidemia 09/24/2020   Essential hypertension    Functional diarrhea 10/26/2020   Herpes genitalis in women 07/16/2015   History of adenomatous polyp of colon 05/21/2019   Overview:   03/31/17: Colonoscopy: nonadvanced adenoma, microscopic colitis, f/u 5 yrs, [facility]/GAP   History of radiation therapy    Endometrial- 02/18/23-03/31/23-Dr. Royston Cornea Kinard   Hypotension 09/01/2022   Insomnia 01/23/2015   Metabolic acidosis 01/12/2023   Migraine headache with aura 02/12/2016   Myofascial pain dysfunction syndrome 08/22/2019   Non-alcoholic fatty liver disease 01/12/2018   OSA (obstructive sleep apnea) 02/24/2019   10/09/2018 - HST  - AHI 40.6    Osteopenia 12/06/2020   Rx alendronate  35 mg.   Pinguecula 12/06/2020   Presbyopia 12/06/2020   Pulmonary hypertension    RLS (restless legs syndrome) 04/27/2015   RSV (respiratory syncytial virus infection) 06/10/2023   Tremor, essential 12/11/2021   Type  II diabetes mellitus (HCC) 09/24/2020     Medicines Meds ordered this encounter  Medications   morphine  (PF) 4 MG/ML injection 4 mg    Refill:  0   ondansetron  (ZOFRAN ) 4 MG tablet    Sig: Take 1 tablet (4 mg total) by mouth every 8 (eight) hours as needed for nausea or vomiting. For cancer related nausea    Dispense:  90 tablet    Refill:  5    I have reviewed the patients home medicines and have made adjustments as needed  Problem List / ED Course: Problem List Items Addressed This Visit       Other   Abdominal pain   Other Visit Diagnoses       Diarrhea, unspecified type    -  Primary                   This note was created using dictation software, which may contain spelling or grammatical errors.    Merdis Stalling, MD 10/22/23 [phone]

## 2023-10-12 NOTE — Transitions of Care (Post Inpatient/ED Visit) (Signed)
 10/12/2023  Name: Amber Stephenson MRN: [id number] DOB: 1945-12-24  Today's TOC FU Call Status: Today's TOC FU Call Status:: Successful TOC FU Call Completed TOC FU Call Complete Date: 10/12/23 Patient's Name and Date of Birth confirmed.  Transition Care Management Follow-up Telephone Call Date of Discharge: 10/12/23 Discharge Facility: [facility] Type of Discharge: Inpatient Admission Primary Inpatient Discharge Diagnosis:: Rectal bleeding How have you been since you were released from the hospital?:  (no GI bleeding but having "explosive diarrhea", seeing primary care provider today, eating "fairly well", drinking adequate fluids) Any questions or concerns?: No  Items Reviewed: Did you receive and understand the discharge instructions provided?: Yes Medications obtained,verified, and reconciled?: Yes (Medications Reviewed) Any new allergies since your discharge?: No Dietary orders reviewed?: Yes Type of Diet Ordered:: soft low fiber residue    carb modified Do you have support at home?: Yes People in Home [RPT]: spouse Name of Support/Comfort Primary Source: Sayde Top Note routed to primary care provider, pt has already reported diarrhea to provider and has post hospital follow up today   Goals Addressed             This Visit's Progress    VBCI Transitions of Care (TOC) Care Plan       Problems:  Recent Hospitalization for treatment of GI bleed Patient reports she has no GI bleeding since hospital discharge but she is having "explosive diarrhea", she made an appointment to see primary care provider today, pt weighs daily, checks CBG daily, checks 02 saturation  Goal:  Over the next 30 days, the patient will not experience hospital readmission  Interventions:   Evaluation of current treatment plan related to GI bleed, diarrhea self-management and patient's adherence to plan as established by provider. Discussed plans with patient for ongoing care management  follow up and provided patient with direct contact information for care management team Evaluation of current treatment plan related to GI bleed, diarrhea and patient's adherence to plan as established by provider Advised patient to report any change in health status/ symptoms to doctor Provided education to patient re: signs/ symptoms GI bleeding, Heart Failure action plan, importance of daily weights, reviewed soft, low fiber residue diet Reviewed medications with patient and discussed importance of taking medications as prescribed, pt is holding Eliquis  as instructed and will resume per provider instruction Discussed plans with patient for ongoing care management follow up and provided patient with direct contact information for care management team Screening for signs and symptoms of depression related to chronic disease state  Assessed social determinant of health barriers Reviewed symptoms of dehydration and importance of staying well hydrated with episodes of diarrhea  Patient Self Care Activities:  Attend all scheduled provider appointments Call pharmacy for medication refills 3-7 days in advance of running out of medications Call provider office for new concerns or questions  Notify RN Care Manager of TOC call rescheduling needs Participate in Transition of Care Program/Attend TOC scheduled calls Perform all self care activities independently  Take medications as prescribed   call office if I gain more than 2 pounds in one day or 5 pounds in one week track weight in diary watch for swelling in feet, ankles and legs every day weigh myself daily develop a rescue plan follow rescue plan if symptoms flare-up Let your doctor know if you have any symptoms of GI bleeding Continue weighing daily, checking blood sugar and keeping a log Follow up with pain clinic on 5/29 @ 1 pm  Plan:  Telephone follow up appointment with care management team member scheduled for:  10/20/23 @ 115 pm The  patient has been provided with contact information for the care management team and has been advised to call with any health related questions or concerns.          Medications Reviewed Today: Medications Reviewed Today     Reviewed by Daralyn Earl, RN (Registered Nurse) on 10/12/23 at 1148  Med List Status: <None>   Medication Order Taking? Sig Documenting Provider Last Dose Status Informant  amoxicillin -clavulanate (AUGMENTIN ) 875-125 MG tablet [id number] Yes Take 1 tablet by mouth 2 (two) times daily for 10 days. Gonfa, Taye T, MD Taking Active   ANUCORT-HC  25 MG suppository [id number] Yes Place 25 mg rectally daily. [provider] Taking Active Self  apixaban  (ELIQUIS ) 5 MG TABS tablet [id number] No Take 1 tablet (5 mg total) by mouth 2 (two) times daily.  Patient not taking: Reported on 10/12/2023   Jacqueline Matsu, MD Not Taking Active Self  ARIPiprazole  (ABILIFY ) 2 MG tablet [id number] Yes Take 2 mg by mouth at bedtime. [provider] Taking Active Self  atorvastatin  (LIPITOR) 80 MG tablet [id number]  Take 1 tablet (80 mg total) by mouth daily.  Patient taking differently: Take 80 mg by mouth every evening.   Von Grumbling, PA-C  Expired 10/07/23 2359 Self  clotrimazole  (MYCELEX ) 10 MG troche [id number]  Take 10 mg by mouth in the morning and at bedtime. [provider]  Active Self  Continuous Glucose Sensor (DEXCOM G7 SENSOR) MISC [id number] Yes CHANGE SENSOR EVERY 10 DAYS Shamleffer, Julian Obey, MD Taking Active Self  cyanocobalamin  (VITAMIN B12) 1000 MCG tablet [id number] Yes Take 1 tablet (1,000 mcg total) by mouth daily. Burton Casey, MD Taking Active Self  denosumab  (PROLIA ) 60 MG/ML SOSY injection [id number] Yes Inject 60 mg into the skin every 6 (six) months. [name], MD Taking Active Self           Med Note Author Board, Curley Double Oct 07, 2023  8:01 PM) January**  escitalopram  (LEXAPRO ) 10 MG tablet [id number] Yes Take 10 mg by mouth  daily. [provider] Taking Active Self           Med Note (WARD, ANGELICA G   Wed Oct 07, 2023  7:56 PM) Pt needs refill  fluticasone -salmeterol (ADVAIR DISKUS) 500-50 MCG/ACT AEPB [id number] Yes Inhale 1 puff into the lungs in the morning and at bedtime. [name], MD Taking Active Self           Med Note (WARD, Curley Double Oct 07, 2023  8:09 PM) Pt has not started this medication yet   furosemide  (LASIX ) 40 MG tablet [id number] Yes Take 40 mg by mouth daily. [provider] Taking Active Self  hydrOXYzine  (VISTARIL ) 25 MG capsule [id number] Yes Take 1 capsule (25 mg total) by mouth 3 (three) times daily. [name], MD Taking Active Self  Insulin  Disposable Pump (OMNIPOD 5 G7 PODS, GEN 5,) MISC [id number] No 1 Device by Does not apply route every other day.  Patient not taking: Reported on 10/12/2023   Shamleffer, Julian Obey, MD Not Taking Active Self  insulin  glargine (LANTUS  SOLOSTAR) 100 UNIT/ML Solostar Pen [id number] Yes Inject 30 Units into the skin 2 (two) times daily. Shamleffer, Julian Obey, MD Taking Active Self  insulin  lispro (HUMALOG  KWIKPEN) 100 UNIT/ML KwikPen [id number] Yes Max daily 45 units  Patient taking differently: Inject 6-45 Units  into the skin 3 (three) times daily. Max daily 45 units   Shamleffer, Ibtehal Jaralla, MD Taking Active Self           Med Note (WARD, Aneta Keepers   Wed Oct 07, 2023  7:57 PM) 12 units*  Insulin  Pen Needle 29G X MISC [id number] Yes 1 Device by Does not apply route daily in the afternoon. Shamleffer, Julian Obey, MD Taking Active Self  lidocaine  (XYLOCAINE ) 2 % solution [id number] No Use as directed 15 mLs in the mouth or throat every 4 (four) hours as needed for mouth pain.  Patient not taking: Reported on 10/12/2023   [name], MD Not Taking Active Self  LORazepam  (ATIVAN ) 0.5 MG tablet [id number] No Take 0.5-1 mg by mouth in the morning, at noon, and at bedtime. Take 0.5 mg by mouth in the  morning, 0.5 mg at lunch, and 0.5 mg at bedtime  Patient not taking: Reported on 10/12/2023   [provider] Not Taking Active Self           Med Note (WARD, ANGELICA G   Wed Oct 07, 2023  7:56 PM) Pt needs refill  losartan  (COZAAR ) 25 MG tablet [id number] Yes Take 25 mg by mouth daily. [provider] Taking Active Self  MAGNESIUM  PO [id number] Yes Take 1 tablet by mouth daily. [provider] Taking Active Self  meclizine  (ANTIVERT ) 25 MG tablet [id number] Yes Take 1 tablet (25 mg total) by mouth 3 (three) times daily as needed for dizziness. Dettinger, Lucio Sabin, MD Taking Active Self  metFORMIN  (GLUCOPHAGE -XR) 500 MG 24 hr tablet [id number] Yes Take 2 tablets (1,000 mg total) by mouth daily with breakfast. Shamleffer, Ibtehal Jaralla, MD Taking Active Self  Methocarbamol  1000 MG TABS [id number] Yes Take 1,000 mg by mouth every 6 (six) hours as needed.  Patient taking differently: Take 1,000 mg by mouth every 6 (six) hours as needed (muscle spasms).   Lovorn, Megan, MD Taking Active Self  metoprolol  tartrate (LOPRESSOR ) 50 MG tablet [id number] Yes Take 1 tablet (50 mg total) by mouth 2 (two) times daily. Jacqueline Matsu, MD Taking Active Self  naloxone  (NARCAN ) nasal spray 4 mg/0.1 mL [id number] Yes Place 1 spray into the nose daily as needed (for over dose). [provider] Taking Active Self           Med Note (CRUTHIS, CHLOE C   Wed Aug 19, 2023  1:55 PM)    nystatin  (MYCOSTATIN /NYSTOP ) powder [id number] No Apply 1 Application topically as needed (rash).  Patient not taking: Reported on 10/12/2023   Shamleffer, Ibtehal Jaralla, MD Not Taking Active Self           Med Note (CRUTHIS, CHLOE C   Wed Aug 19, 2023  1:55 PM)    nystatin  ointment (MYCOSTATIN ) [id number] No Apply 1 Application topically 2 (two) times daily as needed (for rashes).  Patient not taking: Reported on 10/12/2023   Javan Messing, NP Not Taking Active Self           Med Note (CRUTHIS, CHLOE  C   Wed Aug 19, 2023  1:55 PM)    omeprazole (PRILOSEC) 40 MG capsule [id number] No Take 40 mg by mouth daily.  Patient not taking: Reported on 10/12/2023   [provider] Not Taking Active Self           Med Note (WARD, Aneta Keepers   Wed Oct 07, 2023  8:09 PM) Pt has not started this medication yet  ondansetron  (ZOFRAN ) 4 MG tablet [id number] No Take 1 tablet (4 mg total) by mouth every 8 (eight) hours as needed for nausea or vomiting. For cancer related nausea  Patient not taking: Reported on 10/12/2023   Lovorn, Megan, MD Not Taking Active Self  OXcarbazepine  (TRILEPTAL ) 150 MG tablet [id number] Yes Take 1 tablet (150 mg total) by mouth 2 (two) times daily. Aura Leeds [place], DO Taking Active Self  Oxycodone  HCl 20 MG TABS [id number] Yes Take 1 tablet (20 mg total) by mouth 2 (two) times daily as needed. Do Not Fill Before 10/05/23  Patient taking differently: Take 1 tablet by mouth in the morning and at bedtime. Do Not Fill Before 10/05/23   Lovorn, Megan, MD Taking Active Self  potassium chloride  (KLOR-CON ) 10 MEQ tablet [id number]  Take 1 tablet (10 mEq total) by mouth daily. Von Grumbling, [place]  Expired 10/07/23 2359 Self  pregabalin  (LYRICA ) 200 MG capsule [id number] Yes Take 200 mg by mouth 2 (two) times daily. [provider] Taking Active Self           Med Note (WARD, Aneta Keepers   Wed Oct 07, 2023  7:56 PM) Pt needs refill  rOPINIRole  (REQUIP ) 1 MG tablet [id number] Yes Take 1 tablet (1 mg total) by mouth at bedtime. For leg cramps [name], MD Taking Active Self  tirzepatide  (MOUNJARO ) 12.5 MG/0.5ML Pen [id number] Yes Inject 12.5 mg into the skin once a week. Shamleffer, Julian Obey, MD Taking Active Self  Vitamin D , Cholecalciferol , 25 MCG (1000 UT) TABS [id number] Yes Take 1,000 Units by mouth daily in the afternoon. [provider] Taking Active Self  Med List Note Levi Real, CPhT 08/29/22 1018): [employer] insurance             Home Care  and Equipment/Supplies: Were Home Health Services Ordered?: No Any new equipment or medical supplies ordered?: No  Functional Questionnaire: Do you need assistance with bathing/showering or dressing?: No Do you need assistance with meal preparation?: No Do you need assistance with eating?: No Do you have difficulty maintaining continence: No Do you need assistance with getting out of bed/getting out of a chair/moving?: No Do you have difficulty managing or taking your medications?: Yes (spouse provides oversight)  Follow up appointments reviewed: PCP Follow-up appointment confirmed?: Yes Date of PCP follow-up appointment?: 10/12/23 Follow-up Provider: Dr. [name] @ 225 pm Specialist Hospital Follow-up appointment confirmed?: Yes Date of Specialist follow-up appointment?: 10/29/23 Follow-Up Specialty Provider:: [facility] @ 1 pm,  to follow up with GI 2-3 weeks Do you need transportation to your follow-up appointment?: No Do you understand care options if your condition(s) worsen?: Yes-patient verbalized understanding  SDOH Interventions Today    Flowsheet Row Most Recent Value  SDOH Interventions   Food Insecurity Interventions Intervention Not Indicated  Housing Interventions Intervention Not Indicated  Transportation Interventions Intervention Not Indicated  Utilities Interventions Intervention Not Indicated       Cecilie Coffee [facility], BSN RN Care Manager/ Transition of Care [place]/ [facility] [phone]

## 2023-10-12 NOTE — Discharge Instructions (Addendum)
 Thank you for coming to [facility] Emergency Department. You were seen for abdominal pain, diarrhea. We did an exam, labs, and imaging, and these showed no acute findings.   Please continue to take your Augmentin  as prescribed on discharge and continue to hold your Eliquis  as originally instructed.  Please stay well-hydrated at home. You can take Zofran  as needed for nausea and vomiting.  Please follow up with your primary care provider within 1 week. We do recommend outpatient stool sample as you were not able to provide one in the ED.   Do not hesitate to return to the ED or call 911 if you experience: -Worsening symptoms - -Lightheadedness, passing out -Fevers/chills -Anything else that concerns you

## 2023-10-12 NOTE — Progress Notes (Signed)
 Subjective:  Patient ID: Amber Stephenson, female    DOB: May 25, 1946  Age: 78 y.o. MRN: [id number]  CC: Diarrhea (Explosive diarrhea, stomach pain and rectal pain.  No fever. Super dark color. Has to go to bathroom right after eating and no appetite. ), sick (Still having runny nose, dry cough, right ear pain, fatigue.  Extremely raw mouth and about out of meds. All symptoms still going since the last time we saw her. ), and Medication Refill (Can we start prescribing lyrica /Needs cough and nausea meds as well. )   HPI Amber Stephenson presents for explosive diarrhea X 15 today. Hospitalized last week for BRBPR & diverticulitis.. DC was 2 days ago. Diarrhea started at home the following day which was yesterday. Now having LLQ pain. 7/10 pain. Sharp. Able to hold down fluids. Watery fluid is not mucoid.  Taking augmntin for the diverticulitis dx.      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), Benign paroxysmal positional vertigo due to bilateral vestibular disorder (05/20/2019), Bipolar 1 disorder (HCC) (01/23/2015), CAD (coronary artery disease), Cardiomegaly (01/12/2018), CHF (congestive heart failure) (06/08/2022), Chronic back pain (01/04/2015), Chronic constipation (04/25/2020), Chronic diastolic heart failure (HCC) (30/86/5784), Chronic pain syndrome (08/22/2019), Chronic post-traumatic stress disorder (PTSD) (12/06/2020), Diabetic neuropathy (HCC) (02/06/2016), Dyslipidemia (09/24/2020), Essential hypertension, Functional diarrhea (10/26/2020), Herpes genitalis in women  (07/16/2015), History of adenomatous polyp of colon (05/21/2019), History of radiation therapy, Hypotension (09/01/2022), Insomnia (01/23/2015), Metabolic acidosis (01/12/2023), Migraine headache with aura (02/12/2016), Myofascial pain dysfunction syndrome (08/22/2019), Non-alcoholic fatty liver disease (69/62/9528), OSA (obstructive sleep apnea) (02/24/2019), Osteopenia (12/06/2020), Pinguecula (12/06/2020), Presbyopia (12/06/2020), Pulmonary hypertension, RLS (restless legs syndrome) (04/27/2015), RSV (respiratory syncytial virus infection) (06/10/2023), Tremor, essential (12/11/2021), and Type II diabetes mellitus (HCC) (09/24/2020).   She has a past surgical history that includes THIGH SURGERY; Shoulder surgery (Right); Breast reduction surgery; Eye surgery (Right); Hammer toe surgery; LEFT HEART CATH AND CORONARY ANGIOGRAPHY (N/A, 02/03/2018); and Reverse shoulder arthroplasty (Right, 08/19/2019).   Her family history includes Alcohol  abuse in her sister; Alzheimer's disease in her father; Diabetes in her brother, mother, and sister; Heart disease in her brother, mother, and sister; Mental illness in her brother; Stroke in her brother.She reports that she has never smoked. She has never used smokeless tobacco. She reports that she does not currently use alcohol . She reports that she does not use drugs.    ROS Review of Systems  Constitutional:  Positive for activity change and appetite change. Negative for fever.  HENT:  Positive for ear pain (right), mouth sores and sore throat. Negative for congestion.   Eyes:  Negative for visual disturbance.  Respiratory:  Negative for shortness of breath.   Cardiovascular:  Negative for chest pain.  Gastrointestinal:  Positive for abdominal pain, diarrhea and nausea. Negative for constipation and vomiting.  Genitourinary:  Negative for difficulty urinating.  Neurological:  Negative for headaches.    Objective:  BP 115/82   Pulse 85   Temp 98.5 F (36.9  C)   Ht 5\' 2"  (1.575 m)   Wt 182 lb (  82.6 kg)   SpO2 95%   BMI 33.29 kg/m   BP Readings from Last 3 Encounters:  10/12/23 115/82  10/10/23 (!) 153/93  10/06/23 116/81    Wt Readings from Last 3 Encounters:  10/12/23 182 lb (82.6 kg)  10/12/23 182 lb (82.6 kg)  10/07/23 175 lb 0.7 oz (79.4 kg)     Physical Exam Constitutional:      General: She is in acute distress.     Appearance: She is well-developed. She is obese. She is ill-appearing, toxic-appearing and diaphoretic.  Cardiovascular:     Rate and Rhythm: Normal rate and regular rhythm.  Pulmonary:     Breath sounds: Normal breath sounds.  Abdominal:     General: There is distension.     Tenderness: There is abdominal tenderness (all quadrants, with guarding. No rebound).  Musculoskeletal:        General: No tenderness. Normal range of motion.  Skin:    General: Skin is warm.     Coloration: Skin is not pale.     Findings: No rash.  Neurological:     Mental Status: She is alert and oriented to person, place, and time.     Motor: Weakness present.      Assessment & Plan:  Rectal bleeding -     Hemoglobin, fingerstick  Primary hypertension  Abdominal pain, unspecified abdominal location -     Cdiff NAA+O+P+Stool Culture -     Clostridium difficile EIA  Other orders -     metroNIDAZOLE ; Take 1 tablet (500 mg total) by mouth 3 (three) times daily for 7 days.  Dispense: 21 tablet; Refill: 0  After completing assessment patient appeared to have such severe pain and diarrhea that treatment at home does not look at all possible.  This is especially true considering that she has a known insulin -dependent diabetic who will most certainly see wide swings in her blood sugar as a result of the disturbance in her intake and increased output.  As result of the above metronidazole  and C. difficile, etc. have been canceled in favor of transferring to the hospital behavioral mass.   Follow-up: after DC from the  hospital  Amber Stephenson, M.D.

## 2023-10-12 NOTE — Telephone Encounter (Signed)
 Chief Complaint: Diarrhea Symptoms: Diarrhea, rectal pain Frequency: 15 episodes in last 24 hours Pertinent Negatives: Patient denies blood in stool, abdominal pain, fever Disposition: [] ED /[] Urgent Care (no appt availability in office) / [x] Appointment(In office/virtual)/ []  [place] [facility]/ [] Home Care/ [] Refused Recommended Disposition /[] [place] [facility]/ []  Follow-up with PCP Additional Notes: Patient called in, originally to make a hospital follow up appt, but stated she is having 'explosive diarrhea' since yesterday. Patient states she is currently on Augmentin , and was also on abx in the hospital, but denies apperance of mucous-like diarrhea/Cdiff features. Patient states she is feeling mildly more weak than normal, and is still able to drink but not eating much since yesterday. Patient denies abdominal pain, but states she has rectal pain. Patient denies blood in stool. Patient appt made today for further evaluation.    Copied from CRM [id number]. Topic: Clinical - Red Word Triage >> Oct 12, 2023  7:34 AM Essie A wrote: Red Word that prompted transfer to Nurse Triage: Explosive diarrhea since 10/11/23 Reason for Disposition  SEVERE diarrhea (e.g., 7 or more times / day more than normal)  Answer Assessment - Initial Assessment Questions 1. ANTIBIOTIC: "What antibiotic are you taking?" "How many times per day?"     Augmentin   2. ANTIBIOTIC ONSET: "When was the antibiotic started?"     Saturday  3. DIARRHEA SEVERITY: "How bad is the diarrhea?" "How many more stools have you had in the past 24 hours than normal?"    - NO DIARRHEA (SCALE 0)   - MILD (SCALE 1-3): Few loose or mushy BMs; increase of 1-3 stools over normal daily number of stools; mild increase in ostomy output.   -  MODERATE (SCALE 4-7): Increase of 4-6 stools daily over normal; moderate increase in ostomy output. * SEVERE (SCALE 8-10; OR 'WORST POSSIBLE'): Increase of 7 or more stools daily over normal;  moderate increase in ostomy output; incontinence.     15 4. ONSET: "When did the diarrhea begin?"      Sunday 5. BM CONSISTENCY: "How loose or watery is the diarrhea?"      Explosive - very loose 6. VOMITING: "Are you also vomiting?" If Yes, ask: "How many times in the past 24 hours?"      No 7. ABDOMEN PAIN: "Are you having any abdomen pain?" If Yes, ask: "What does it feel like?" (e.g., crampy, dull, intermittent, constant)      No 8. ABDOMEN PAIN SEVERITY: If present, ask: "How bad is the pain?"  (e.g., Scale 1-10; mild, moderate, or severe)   - MILD (1-3): doesn't interfere with normal activities, abdomen soft and not tender to touch    - MODERATE (4-7): interferes with normal activities or awakens from sleep, abdomen tender to touch    - SEVERE (8-10): excruciating pain, doubled over, unable to do any normal activities       No 9. ORAL INTAKE: If vomiting, "Have you been able to drink liquids?" "How much liquids have you had in the past 24 hours?"     Yes 10. HYDRATION: "Any signs of dehydration?" (e.g., dry mouth [not just dry lips], too weak to stand, dizziness, new weight loss) "When did you last urinate?"       Dizziness, weakness 11. EXPOSURE: "Have you traveled to a foreign country recently?" "Have you been exposed to anyone with diarrhea?" "Could you have eaten any food that was spoiled?"       No 12. OTHER SYMPTOMS: "Do you have any other  symptoms?" (e.g., fever, blood in stool)       No  Protocols used: Diarrhea on Antibiotics-A-AH

## 2023-10-12 NOTE — ED Notes (Signed)
 Pt had to go to [facility] before vitals

## 2023-10-12 NOTE — Telephone Encounter (Signed)
 Appointment made. LS

## 2023-10-12 NOTE — ED Triage Notes (Signed)
 Pt BIB [facility], recent admission for diverticulitis, dark diarrhea since yesterday.

## 2023-10-13 ENCOUNTER — Telehealth: Payer: Self-pay

## 2023-10-13 NOTE — Telephone Encounter (Signed)
 Spoke with patients husband Arma Berkshire) to reschedule her appointment from 10/6 to 9/29 @ 1:00pm. Patient confirmed and understood.

## 2023-10-13 NOTE — Telephone Encounter (Signed)
 Yes. Please come by for stool specimen kit.

## 2023-10-13 NOTE — Transitions of Care (Post Inpatient/ED Visit) (Signed)
 10/13/2023  Name: Amber Stephenson MRN: [id number] DOB: [birth year]  Today's TOC FU Call Status: Today's TOC FU Call Status:: Successful TOC FU Call Completed TOC FU Call Complete Date: 10/13/23 Patient's Name and Date of Birth confirmed.  Transition Care Management Follow-up Telephone Call Date of Discharge: 10/12/23 Discharge [facility] Type of Discharge: Emergency Department Reason for ED Visit: Other: (diarrhea) How have you been since you were released from the hospital?: Same Any questions or concerns?: No  Items Reviewed: Did you receive and understand the discharge instructions provided?: Yes Medications obtained,verified, and reconciled?: Yes (Medications Reviewed) Any new allergies since your discharge?: No Dietary orders reviewed?: Yes Do you have support at home?: Yes People in Home [RPT]: spouse  Medications Reviewed Today: Medications Reviewed Today     Reviewed by Darrall Ellison, LPN (Licensed Practical Nurse) on 10/13/23 at 1709  Med List Status: <None>   Medication Order Taking? Sig Documenting Provider Last Dose Status Informant  amoxicillin -clavulanate (AUGMENTIN ) 875-125 MG tablet [id number] No Take 1 tablet by mouth 2 (two) times daily for 10 days. Gonfa, Taye T, MD Taking Active   ANUCORT-HC  25 MG suppository [id number] No Place 25 mg rectally daily. [provider] Taking Active Self  apixaban  (ELIQUIS ) 5 MG TABS tablet [id number] No Take 1 tablet (5 mg total) by mouth 2 (two) times daily. Jacqueline Matsu, MD Taking Active Self  ARIPiprazole  (ABILIFY ) 2 MG tablet [id number] No Take 2 mg by mouth at bedtime. [provider] Taking Active Self  atorvastatin  (LIPITOR) 80 MG tablet [id number] No Take 1 tablet (80 mg total) by mouth daily.  Patient taking differently: Take 80 mg by mouth every evening.   [place], [place] 10/06/2023 Bedtime Expired 10/07/23 2359 Self  clotrimazole  (MYCELEX ) 10 MG troche [id number] No Take 10 mg by  mouth in the morning and at bedtime. [provider] Taking Active Self  Continuous Glucose Sensor (DEXCOM G7 SENSOR) MISC [id number] No CHANGE SENSOR EVERY 10 DAYS Shamleffer, Julian Obey, MD Taking Active Self  cyanocobalamin  (VITAMIN B12) 1000 MCG tablet [id number] No Take 1 tablet (1,000 mcg total) by mouth daily. Burton Casey, MD Taking Active Self  denosumab  (PROLIA ) 60 MG/ML SOSY injection [id number] No Inject 60 mg into the skin every 6 (six) months. [name], MD Taking Active Self           Med Note Author Board, Curley Double Oct 07, 2023  8:01 PM) January**  escitalopram  (LEXAPRO ) 10 MG tablet [id number] No Take 10 mg by mouth daily. [provider] Taking Active Self           Med Note (WARD, Aneta Keepers   Wed Oct 07, 2023  7:56 PM) Pt needs refill  fluconazole  (DIFLUCAN ) 150 MG tablet [id number]  Take 150 mg by mouth daily. [provider]  Active   fluticasone -salmeterol (ADVAIR DISKUS) 500-50 MCG/ACT AEPB [id number] No Inhale 1 puff into the lungs in the morning and at bedtime. [name], MD Taking Active Self           Med Note (WARD, Curley Double Oct 07, 2023  8:09 PM) Pt has not started this medication yet   furosemide  (LASIX ) 40 MG tablet [id number] No Take 40 mg by mouth daily. [provider] Taking Active Self  hydrOXYzine  (VISTARIL ) 25 MG capsule [id number] No Take 1 capsule (25 mg total) by mouth 3 (three) times daily. [name], MD Taking Active Self  insulin  glargine (LANTUS   SOLOSTAR) 100 UNIT/ML Solostar Pen [id number] No Inject 30 Units into the skin 2 (two) times daily. Shamleffer, Ibtehal Jaralla, MD Taking Active Self  insulin  lispro (HUMALOG  KWIKPEN) 100 UNIT/ML KwikPen [id number] No Max daily 45 units  Patient taking differently: Inject 6-45 Units into the skin 3 (three) times daily. Max daily 45 units   Shamleffer, Ibtehal Jaralla, MD Taking Active Self           Med Note (WARD, Aneta Keepers   Wed Oct 07, 2023  7:57  PM) 12 units*  Insulin  Pen Needle 29G X MISC [id number] No 1 Device by Does not apply route daily in the afternoon. Shamleffer, Julian Obey, MD Taking Active Self  LORazepam  (ATIVAN ) 0.5 MG tablet [id number] No Take 0.5-1 mg by mouth in the morning, at noon, and at bedtime. Take 0.5 mg by mouth in the morning, 0.5 mg at lunch, and 0.5 mg at bedtime [provider] Taking Active Self           Med Note (WARD, ANGELICA G   Wed Oct 07, 2023  7:56 PM) Pt needs refill  losartan  (COZAAR ) 25 MG tablet [id number] No Take 25 mg by mouth daily. [provider] Taking Active Self  MAGNESIUM  PO [id number] No Take 1 tablet by mouth daily. [provider] Taking Active Self  meclizine  (ANTIVERT ) 25 MG tablet [id number] No Take 1 tablet (25 mg total) by mouth 3 (three) times daily as needed for dizziness. Dettinger, Lucio Sabin, MD Taking Active Self  metFORMIN  (GLUCOPHAGE -XR) 500 MG 24 hr tablet [id number] No Take 2 tablets (1,000 mg total) by mouth daily with breakfast. Shamleffer, Ibtehal Jaralla, MD Taking Active Self  Methocarbamol  1000 MG TABS [id number] No Take 1,000 mg by mouth every 6 (six) hours as needed.  Patient taking differently: Take 1,000 mg by mouth every 6 (six) hours as needed (muscle spasms).   Lovorn, Megan, MD Taking Active Self  metoprolol  tartrate (LOPRESSOR ) 50 MG tablet [id number] No Take 1 tablet (50 mg total) by mouth 2 (two) times daily. Jacqueline Matsu, MD Taking Active Self  metroNIDAZOLE  (FLAGYL ) 500 MG tablet [id number]  Take 1 tablet (500 mg total) by mouth 3 (three) times daily for 7 days. [name], MD  Active   naloxone  (NARCAN ) nasal spray 4 mg/0.1 mL [id number] No Place 1 spray into the nose daily as needed (for over dose). [provider] Taking Active Self           Med Note (CRUTHIS, CHLOE C   Wed Aug 19, 2023  1:55 PM)    nystatin  (MYCOSTATIN /NYSTOP ) powder [id number] No Apply 1 Application topically as needed (rash). Shamleffer,  Julian Obey, MD Taking Active Self           Med Note (CRUTHIS, CHLOE C   Wed Aug 19, 2023  1:55 PM)    nystatin  ointment (MYCOSTATIN ) [id number] No Apply 1 Application topically 2 (two) times daily as needed (for rashes). Javan Messing, NP Taking Active Self           Med Note (CRUTHIS, CHLOE C   Wed Aug 19, 2023  1:55 PM)    omeprazole (PRILOSEC) 40 MG capsule [id number] No Take 40 mg by mouth daily. [provider] Taking Active Self           Med Note (WARD, Aneta Keepers   Wed Oct 07, 2023  8:09 PM) Pt has not started this medication yet  ondansetron  (ZOFRAN ) 4 MG tablet [id number]  Take 1  tablet (4 mg total) by mouth every 8 (eight) hours as needed for nausea or vomiting. For cancer related nausea Merdis Stalling, MD  Active   OXcarbazepine  (TRILEPTAL ) 150 MG tablet [id number] No Take 1 tablet (150 mg total) by mouth 2 (two) times daily. Aura Leeds Woll[place], DO Taking Active Self  Oxycodone  HCl 20 MG TABS [id number] No Take 1 tablet (20 mg total) by mouth 2 (two) times daily as needed. Do Not Fill Before 10/05/23  Patient taking differently: Take 1 tablet by mouth in the morning and at bedtime. Do Not Fill Before 10/05/23   Lovorn, Megan, MD Taking Active Self  potassium chloride  (KLOR-CON ) 10 MEQ tablet [id number] No Take 1 tablet (10 mEq total) by mouth daily. [place], [place] 10/06/2023 Morning Expired 10/07/23 2359 Self  pregabalin  (LYRICA ) 200 MG capsule [id number] No Take 200 mg by mouth 2 (two) times daily. [provider] Taking Active Self           Med Note (WARD, Aneta Keepers   Wed Oct 07, 2023  7:56 PM) Pt needs refill  rOPINIRole  (REQUIP ) 1 MG tablet [id number] No Take 1 tablet (1 mg total) by mouth at bedtime. For leg cramps [name], MD Taking Active Self  tirzepatide  (MOUNJARO ) 12.5 MG/0.5ML Pen [id number] No Inject 12.5 mg into the skin once a week. Shamleffer, Julian Obey, MD Taking Active Self  Vitamin D , Cholecalciferol , 25 MCG (1000 UT) TABS  [id number] No Take 1,000 Units by mouth daily in the afternoon. [provider] Taking Active Self  Med List Note Levi Real, CPhT 08/29/22 1018): [employer] insurance             Home Care and Equipment/Supplies: Were Home Health Services Ordered?: NA Has Agency set up a time to come to your home?: No Any new equipment or medical supplies ordered?: NA  Functional Questionnaire: Do you need assistance with bathing/showering or dressing?: No Do you need assistance with meal preparation?: No Do you need assistance with eating?: No Do you have difficulty maintaining continence: No Do you need assistance with getting out of bed/getting out of a chair/moving?: No Do you have difficulty managing or taking your medications?: No  Follow up appointments reviewed: PCP Follow-up appointment confirmed?: No (declinied) MD Provider Line Number:[phone] Given: No Specialist Hospital Follow-up appointment confirmed?: NA Do you need transportation to your follow-up appointment?: No Do you understand care options if your condition(s) worsen?: Yes-patient verbalized understanding    SIGNATURE Darrall Ellison, LPN [facility] Nurse Health Advisor Direct Dial  [phone]

## 2023-10-13 NOTE — Telephone Encounter (Signed)
 Copied from CRM [id number]. Topic: Clinical - Request for Lab/Test Order >> Oct 13, 2023  2:34 PM Carrielelia G wrote: Reason for CRM: Please call [name] regarding [name]'s recent visit to [facility].  And the collection of a stool sample.

## 2023-10-13 NOTE — Telephone Encounter (Signed)
 Husband states that Dr. Veleta Gerold wanted patient to do a stool sample when she was seen yesterday but then decided to send he to ER.  They were not able to get a stool sample at ER yesterday.  Would like to know if you would like them to come get a stool sample kit to complete here to have it checked?

## 2023-10-14 ENCOUNTER — Other Ambulatory Visit: Payer: Self-pay

## 2023-10-14 DIAGNOSIS — K625 Hemorrhage of anus and rectum: Secondary | ICD-10-CM

## 2023-10-14 DIAGNOSIS — R197 Diarrhea, unspecified: Secondary | ICD-10-CM

## 2023-10-14 NOTE — Telephone Encounter (Signed)
 Spoke with husband , they will come today to get kit

## 2023-10-15 ENCOUNTER — Other Ambulatory Visit

## 2023-10-15 DIAGNOSIS — R197 Diarrhea, unspecified: Secondary | ICD-10-CM

## 2023-10-15 DIAGNOSIS — K625 Hemorrhage of anus and rectum: Secondary | ICD-10-CM | POA: Diagnosis not present

## 2023-10-16 ENCOUNTER — Telehealth: Payer: Self-pay | Admitting: Family Medicine

## 2023-10-16 LAB — FECAL OCCULT BLOOD, IMMUNOCHEMICAL: Fecal Occult Bld: NEGATIVE

## 2023-10-16 NOTE — Telephone Encounter (Signed)
 Pt scheduled with PCP 10/21/23 at 1:25.

## 2023-10-16 NOTE — Telephone Encounter (Signed)
 Patient was sent to the hospital from 5/12 appointment, patient needs to be seen after the emergency department for follow-up from the hospital.  Please make an appointment to be seen ASAP as ER follow-up

## 2023-10-16 NOTE — Telephone Encounter (Signed)
 Looks like patient was seen a few days ago. Was this discussed? Please advise on referral.    Copied from CRM [id number]. Topic: Referral - Request for Referral >> Oct 16, 2023  8:19 AM Amber Stephenson wrote: Did the patient discuss referral with their provider in the last year? No (If No - schedule appointment) (If Yes - send message)  Appointment offered? No  Type of order/referral and detailed reason for visit:  gastroenterologist; stomach bleeding  Preference of office, provider, location: Dr Riley Cheadle, [phone]  If referral order, have you been seen by this specialty before? Yes (If Yes, this issue or another issue? When? Where?  Can we respond through MyChart? Yes

## 2023-10-19 ENCOUNTER — Ambulatory Visit
Admission: RE | Admit: 2023-10-19 | Discharge: 2023-10-19 | Disposition: A | Discharge status: Home / Self Care | Source: Ambulatory Visit | Attending: Family Medicine | Admitting: Family Medicine

## 2023-10-19 ENCOUNTER — Ambulatory Visit: Payer: Self-pay

## 2023-10-19 VITALS — BP 118/79 | HR 89 | Temp 97.8°F | Resp 18

## 2023-10-19 DIAGNOSIS — R1084 Generalized abdominal pain: Secondary | ICD-10-CM

## 2023-10-19 DIAGNOSIS — R197 Diarrhea, unspecified: Secondary | ICD-10-CM | POA: Diagnosis not present

## 2023-10-19 DIAGNOSIS — R531 Weakness: Secondary | ICD-10-CM

## 2023-10-19 NOTE — ED Triage Notes (Signed)
 Pt states she is having diarrhea x 1 month. Pt has been in the hospital twice for it. Pt says she is still having diarrhea and feeling weak.

## 2023-10-19 NOTE — Telephone Encounter (Signed)
 Chief Complaint:  Diarrhea- s/s of dehydration   Symptoms:  -8 loose BM today, dry mouth, dizziness, abd. Pain   Frequency:  A couple days ago     Patient denies  SOB, blood in stool, vomiting, abnormal odor, mucus filled stool.   Disposition: [ ] ED /[X ]Urgent Care (no appt availability in office) / [ ] Appointment(In office/virtual)/ [ ]  [facility]/ [ ] Home Care/ [ ] Refused Recommended Disposition /[ ] [facility]/ [ ]  Follow-up with PCP   Additional Notes:  Pt was seen in ED 5/12 for diarrhea, currently on two antibiotics and feels like the abd pain is better but diarrhea is getting worse. Pt showing s/s of dehydration.   Referred pt to Urgent Care, patient will have her husband drive her. Educated on s/s that will require emergent help/911 as well.    Complete triage note below:  Copied from CRM [id number]. Topic: Clinical - Medical Advice >> Oct 19, 2023 10:21 AM Danelle Dunning F wrote: Reason for CRM:   Patient called in to retrieve the results on her recent stool sample.   Please contact patient using the contact information listed below.  Callback Number: [id number] Reason for Disposition  [1] Drinking very little AND [2] dehydration suspected (e.g., no urine > 12 hours, very dry mouth, very lightheaded)  Answer Assessment - Initial Assessment Questions 1. DIARRHEA SEVERITY: "How bad is the diarrhea?" "How many more stools have you had in the past 24 hours than normal?"    - NO DIARRHEA (SCALE 0)   - MILD (SCALE 1-3): Few loose or mushy BMs; increase of 1-3 stools over normal daily number of stools; mild increase in ostomy output.   -  MODERATE (SCALE 4-7): Increase of 4-6 stools daily over normal; moderate increase in ostomy output.   -  SEVERE (SCALE 8-10; OR "WORST POSSIBLE"): Increase of 7 or more stools daily over normal; moderate increase in ostomy output; incontinence.      2. ONSET: "When did the diarrhea begin?"      3 days ago.    3. BM  CONSISTENCY: "How loose or watery is the diarrhea?"      - Dark stool- loose  ---- 8 BM today.    4. VOMITING: "Are you also vomiting?" If Yes, ask: "How many times in the past 24 hours?"     --- Nausea 1 episode, denies vomiting    5. ABDOMEN PAIN: "Are you having any abdomen pain?" If Yes, ask: "What does it feel like?" (e.g., crampy, dull, intermittent, constant)      ---Yes,Intermittent cramping    6. ABDOMEN PAIN SEVERITY: If present, ask: "How bad is the pain?"  (e.g., Scale 1-10; mild, moderate, or severe)   - MILD (1-3): doesn't interfere with normal activities, abdomen soft and not tender to touch    - MODERATE (4-7): interferes with normal activities or awakens from sleep, abdomen tender to touch    - SEVERE (8-10): excruciating pain, doubled over, unable to do any normal activities      --- Pain: 7/10 when she  does have it     7. ORAL INTAKE: If vomiting, "Have you been able to drink liquids?" "How much liquids hae you had in the past 24 hours?"     ---- 42oz- refilled 2x daiy    8. HYDRATION: "Any signs of dehydration?" (e.g., dry mouth [not just dry lips], too weak to stand, dizziness, new weight loss) "When did you last urinate?"     ----Dizziness  the past couple of days ------- Last urination: ago.    10. ANTIBIOTIC USE: "Are you taking antibiotics now or have you taken antibiotics in the past 2 months?"       ---- Currently on two anbiotics:  Feels like the abdominal pain has been getting better since taking anbiotics.    11. OTHER SYMPTOMS: "Do you have any other symptoms?" (e.g., fever, blood in stool)       ---Denies  Protocols used: Diarrhea-A-AH

## 2023-10-19 NOTE — ED Provider Notes (Addendum)
 [facility]    CSN: [id number] Arrival date & time: 10/19/23  1607      History   Chief Complaint Chief Complaint  Patient presents with   Diarrhea    HPI Amber Stephenson is a 78 y.o. female.   Patient presenting today following up on 1 month history of severe diarrhea, generalized abdominal pain.  She was admitted on 10/07/2023 for a lower GI bleed, diverticulitis, atrial fibrillation and RVR.  She was then again in the emergency department for similar symptoms on 10/12/2023.  Has follow-up scheduled on this with her PCP on 10/21/2023 but was told by the triage nurse today that she may be dehydrated as she is now feeling weak and not wanting to eat.  She is tolerating p.o. fluids at this time.  States she continues to have generalized abdominal pain.  Had an episode of melena about a week ago but none since.  Has Zofran  at home but has not been taking it.  Otherwise not trying anything over-the-counter for symptoms.    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   Benign paroxysmal positional vertigo due to bilateral vestibular disorder 05/20/2019   Bipolar 1 disorder (HCC) 01/23/2015   with GAD, benzo dependence   CAD (coronary artery disease)    Nonobstructive; Managed by Dr. [name]   Cardiomegaly 01/12/2018   CHF (congestive heart failure) 06/08/2022   Chronic back pain 01/04/2015   Chronic constipation 04/25/2020   Chronic diastolic heart failure (HCC) 05/30/2015   Chronic pain syndrome 08/22/2019   back pain, sacroiliitis   Chronic post-traumatic stress disorder (PTSD) 12/06/2020   Diabetic neuropathy (HCC) 02/06/2016   Dyslipidemia 09/24/2020   Essential hypertension    Functional  diarrhea 10/26/2020   Herpes genitalis in women 07/16/2015   History of adenomatous polyp of colon 05/21/2019   Overview:   03/31/17: Colonoscopy: nonadvanced adenoma, microscopic colitis, f/u 5 yrs, [facility]/GAP   History of radiation therapy    Endometrial- 02/18/23-03/31/23-Dr. Royston Cornea Kinard   Hypotension 09/01/2022   Insomnia 01/23/2015   Metabolic acidosis 01/12/2023   Migraine headache with aura 02/12/2016   Myofascial pain dysfunction syndrome 08/22/2019   Non-alcoholic fatty liver disease 01/12/2018   OSA (obstructive sleep apnea) 02/24/2019   10/09/2018 - HST  - AHI 40.6    Osteopenia 12/06/2020   Rx alendronate  35 mg.   Pinguecula 12/06/2020   Presbyopia 12/06/2020   Pulmonary hypertension    RLS (restless legs syndrome) 04/27/2015   RSV (respiratory syncytial virus infection) 06/10/2023   Tremor, essential 12/11/2021   Type II diabetes mellitus (HCC) 09/24/2020    Patient Active Problem List   Diagnosis Date Noted   Rectal bleeding 10/07/2023   Nocturnal enuresis 08/25/2023   Hyponatremia 06/10/2023   Abdominal pain 06/10/2023   Candidiasis of mouth 05/15/2023   Type 2 diabetes mellitus with hyperglycemia, with long-term current use of insulin  (HCC) 02/12/2023   Long term (current) use of oral hypoglycemic drugs 02/12/2023   Primary hypertension 01/14/2023   Endometrial cancer (HCC) 01/12/2023   Vulvar itching 12/22/2022   Burning with urination 11/13/2022   Vitamin B12 deficiency 11/09/2022   Frequent falls 09/01/2022   Hemorrhoids 03/11/2022   Microalbuminuria 01/21/2022   Tremor, essential 12/11/2021   Chronic bilateral  low back pain with right-sided sciatica 07/19/2021   Vaginal itching 04/16/2021   Sensory ataxia 03/28/2021   Elevated troponin 12/14/2020   Chronic post-traumatic stress disorder (PTSD) 12/06/2020   Senile corneal changes 12/06/2020   Postmenopausal bleeding 12/06/2020   Acquired hammer toe 12/06/2020   Opioid dependence 12/06/2020    Hypermetropia 12/06/2020   Generalized weakness 12/06/2020   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   Generalized anxiety disorder 12/06/2020   Functional diarrhea 10/26/2020   Insulin  dependent type 2 diabetes mellitus (HCC) 09/24/2020   Chronic respiratory failure with hypoxia (HCC) 04/25/2020   Chronic constipation 04/25/2020   CAD (coronary artery disease)    Hypocalcemia    S/P shoulder replacement, 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   Mixed hyperlipidemia 05/20/2019   Vertigo 04/25/2019   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   OSA (obstructive sleep apnea) 02/24/2019   At risk for adverse drug event 07/06/2018   Non-alcoholic fatty liver disease 01/12/2018   Mild intermittent asthma 01/12/2018   Senile osteoporosis 12/15/2017   Atrial fibrillation with RVR (HCC) 05/19/2017   Migraine headache with aura 02/12/2016   Diabetic neuropathy (HCC) 02/06/2016   Bipolar 1 disorder (HCC) 10/04/2015   Major depressive disorder 10/03/2015   Herpes genitalis in women 07/16/2015   Long term current use of anticoagulant 05/30/2015   Chronic heart failure with preserved ejection fraction (HFpEF) (HCC) 05/30/2015   RLS (restless legs syndrome) 04/27/2015   Insomnia 01/23/2015   Chronic atrial fibrillation with RVR (HCC) 01/04/2015    Past Surgical History:  Procedure Laterality Date   BREAST REDUCTION SURGERY     EYE SURGERY Right    cateracts   HAMMER TOE SURGERY     LEFT HEART CATH AND CORONARY ANGIOGRAPHY N/A 02/03/2018   Procedure: LEFT HEART CATH AND CORONARY ANGIOGRAPHY;  Surgeon: [name], Peter M, MD;  Location: [facility] INVASIVE CV LAB;  Service: Cardiovascular;  Laterality: N/A;   REVERSE SHOULDER ARTHROPLASTY Right 08/19/2019   Procedure: REVERSE SHOULDER ARTHROPLASTY;  Surgeon: Winston Hawking, MD;  Location: [facility];  Service: Orthopedics;  Laterality: Right;  interscalene block   SHOULDER SURGERY Right    "I BROKE MY SHOUDLER   THIGH SURGERY     "TO REMOVE A TUMOR "    OB History      Gravida  3   Para  3   Term  3   Preterm      AB      Living  3      SAB      IAB      Ectopic      Multiple      Live Births  3            Home Medications    Prior to Admission medications   Medication Sig Start Date End Date Taking? Authorizing Provider  amoxicillin -clavulanate (AUGMENTIN ) 875-125 MG tablet Take 1 tablet by mouth 2 (two) times daily for 10 days. 10/10/23 10/20/23 Yes Gonfa, Taye T, MD  apixaban  (ELIQUIS ) 5 MG TABS tablet Take 1 tablet (5 mg total) by mouth 2 (two) times daily. 09/09/22  Yes Turner, Rufus Council, MD  ARIPiprazole  (ABILIFY ) 2 MG tablet Take 2 mg by mouth at bedtime. 10/07/21  Yes [provider]  atorvastatin  (LIPITOR) 80 MG tablet Take 1 tablet (80 mg total) by mouth daily. Patient taking differently: Take 80 mg by mouth every evening. 02/05/23 10/19/23 Yes Conte, Tessa N, PA-C  clotrimazole  (MYCELEX ) 10 MG troche Take 10 mg by mouth in the morning and at  bedtime. 05/28/23  Yes [provider]  Continuous Glucose Sensor (DEXCOM G7 SENSOR) MISC CHANGE SENSOR EVERY 10 DAYS 09/28/23  Yes Shamleffer, Ibtehal Jaralla, MD  cyanocobalamin  (VITAMIN B12) 1000 MCG tablet Take 1 tablet (1,000 mcg total) by mouth daily. 09/04/22  Yes Ghimire, Estil Heman, MD  escitalopram  (LEXAPRO ) 10 MG tablet Take 10 mg by mouth daily.   Yes [provider]  furosemide  (LASIX ) 40 MG tablet Take 40 mg by mouth daily. 09/18/23  Yes [provider]  hydrOXYzine  (VISTARIL ) 25 MG capsule Take 1 capsule (25 mg total) by mouth 3 (three) times daily. 09/29/23  Yes [name], MD  insulin  glargine (LANTUS  SOLOSTAR) 100 UNIT/ML Solostar Pen Inject 30 Units into the skin 2 (two) times daily. 09/21/23  Yes Shamleffer, Ibtehal Jaralla, MD  insulin  lispro (HUMALOG  KWIKPEN) 100 UNIT/ML KwikPen Max daily 45 units Patient taking differently: Inject 6-45 Units into the skin 3 (three) times daily. Max daily 45 units 05/22/23  Yes Shamleffer, Ibtehal Jaralla, MD   LORazepam  (ATIVAN ) 0.5 MG tablet Take 0.5-1 mg by mouth in the morning, at noon, and at bedtime. Take 0.5 mg by mouth in the morning, 0.5 mg at lunch, and 0.5 mg at bedtime   Yes [provider]  losartan  (COZAAR ) 25 MG tablet Take 25 mg by mouth daily. 02/14/23  Yes [provider]  MAGNESIUM  PO Take 1 tablet by mouth daily.   Yes [provider]  metFORMIN  (GLUCOPHAGE -XR) 500 MG 24 hr tablet Take 2 tablets (1,000 mg total) by mouth daily with breakfast. 05/22/23  Yes Shamleffer, Ibtehal Jaralla, MD  Methocarbamol  1000 MG TABS Take 1,000 mg by mouth every 6 (six) hours as needed. Patient taking differently: Take 1,000 mg by mouth every 6 (six) hours as needed (muscle spasms). 08/31/23  Yes Lovorn, Megan, MD  metoprolol  tartrate (LOPRESSOR ) 50 MG tablet Take 1 tablet (50 mg total) by mouth 2 (two) times daily. 01/06/23  Yes Turner, Rufus Council, MD  metroNIDAZOLE  (FLAGYL ) 500 MG tablet Take 1 tablet (500 mg total) by mouth 3 (three) times daily for 7 days. 10/12/23 10/19/23 Yes Stacks, Vallorie Gayer, MD  OXcarbazepine  (TRILEPTAL ) 150 MG tablet Take 1 tablet (150 mg total) by mouth 2 (two) times daily. 06/13/22  Yes Sheikh, Omair Latif, DO  Oxycodone  HCl 20 MG TABS Take 1 tablet (20 mg total) by mouth 2 (two) times daily as needed. Do Not Fill Before 10/05/23 Patient taking differently: Take 1 tablet by mouth in the morning and at bedtime. Do Not Fill Before 10/05/23 08/31/23  Yes Lovorn, Megan, MD  rOPINIRole  (REQUIP ) 1 MG tablet Take 1 tablet (1 mg total) by mouth at bedtime. For leg cramps 09/07/23  Yes Stacks, Vallorie Gayer, MD  tirzepatide  (MOUNJARO ) 12.5 MG/0.5ML Pen Inject 12.5 mg into the skin once a week. 09/21/23  Yes Shamleffer, Julian Obey, MD  Vitamin D , Cholecalciferol , 25 MCG (1000 UT) TABS Take 1,000 Units by mouth daily in the afternoon. 07/26/20  Yes [provider]  ANUCORT-HC  25 MG suppository Place 25 mg rectally daily. 09/28/23   [provider]  denosumab  (PROLIA )  60 MG/ML SOSY injection Inject 60 mg into the skin every 6 (six) months. 08/14/20   [name], MD  fluconazole  (DIFLUCAN ) 150 MG tablet Take 150 mg by mouth daily. 10/08/23   [provider]  fluticasone -salmeterol (ADVAIR DISKUS) 500-50 MCG/ACT AEPB Inhale 1 puff into the lungs in the morning and at bedtime. 10/06/23   [name], MD  Insulin  Pen Needle 29G X  MISC 1 Device by Does not apply route daily in the afternoon. 05/22/23   Shamleffer, Ibtehal Jaralla, MD  meclizine  (ANTIVERT ) 25 MG tablet Take 1 tablet (25 mg total) by mouth 3 (three) times daily as needed for dizziness. 06/05/23   Dettinger, Lucio Sabin, MD  naloxone  (NARCAN ) nasal spray 4 mg/0.1 mL Place 1 spray into the nose daily as needed (for over dose). 08/08/21   [provider]  nystatin  (MYCOSTATIN /NYSTOP ) powder Apply 1 Application topically as needed (rash). 06/16/22   Shamleffer, Ibtehal Jaralla, MD  nystatin  ointment (MYCOSTATIN ) Apply 1 Application topically 2 (two) times daily as needed (for rashes). 04/06/23   Javan Messing, NP  omeprazole (PRILOSEC) 40 MG capsule Take 40 mg by mouth daily.    [provider]  ondansetron  (ZOFRAN ) 4 MG tablet Take 1 tablet (4 mg total) by mouth every 8 (eight) hours as needed for nausea or vomiting. For cancer related nausea 10/12/23   Merdis Stalling, MD  potassium chloride  (KLOR-CON ) 10 MEQ tablet Take 1 tablet (10 mEq total) by mouth daily. 02/05/23 10/07/23  Von Grumbling, PA-C  pregabalin  (LYRICA ) 200 MG capsule Take 200 mg by mouth 2 (two) times daily.    [provider]    Family History Family History  Problem Relation Age of Onset   Diabetes Mother    Heart disease Mother    Alzheimer's disease Father    Heart disease Sister        CABG   Diabetes Sister    Alcohol  abuse Sister    Stroke Brother    Heart disease Brother    Mental illness Brother    Diabetes Brother    Breast cancer Neg Hx    Ovarian cancer Neg Hx    Colon cancer  Neg Hx    Endometrial cancer Neg Hx     Social History Social History   Tobacco Use   Smoking status: Never   Smokeless tobacco: Never  Vaping Use   Vaping status: Never Used  Substance Use Topics   Alcohol  use: Not Currently    Alcohol /week: 0.0 standard drinks of alcohol    Drug use: No     Allergies   Iodine, Ivp dye [iodinated contrast media], Latex, Tape, and Tizanidine    Review of Systems Review of Systems Per HPI  Physical Exam Triage Vital Signs ED Triage Vitals  Encounter Vitals Group     BP 10/19/23 1631 118/79     Systolic BP Percentile --      Diastolic BP Percentile --      Pulse Rate 10/19/23 1631 89     Resp 10/19/23 1631 18     Temp 10/19/23 1631 97.8 F (36.6 C)     Temp Source 10/19/23 1631 Oral     SpO2 10/19/23 1631 99 %     Weight --      Height --      Head Circumference --      Peak Flow --      Pain Score 10/19/23 1632 0     Pain Loc --      Pain Education --      Exclude from Growth Chart --    No data found.  Updated Vital Signs BP 118/79 (BP Location: Right Arm) Comment: Simultaneous filing. User may not have seen previous data. Comment (BP Location): Simultaneous filing. User may not have seen previous data.  Pulse 89 Comment: Simultaneous filing. User may not have seen previous data.  Temp 97.8 F (36.6 C) (Oral) Comment: Simultaneous filing. User may not have seen previous data. Comment (Src): Simultaneous filing. User may not have seen previous data.  Resp 18 Comment: Simultaneous filing. User may not have seen previous data.  SpO2 99% Comment: Simultaneous filing. User may not have seen previous data.  Visual Acuity Right Eye Distance:   Left Eye Distance:   Bilateral Distance:    Right Eye Near:   Left Eye Near:    Bilateral Near:     Physical Exam Vitals and nursing note reviewed.  Constitutional:      Comments: Appears weak, fatigued  HENT:     Head: Atraumatic.     Mouth/Throat:     Mouth: Mucous membranes  are moist.     Pharynx: Oropharynx is clear.  Eyes:     Extraocular Movements: Extraocular movements intact.     Conjunctiva/sclera: Conjunctivae normal.  Cardiovascular:     Rate and Rhythm: Normal rate.  Pulmonary:     Effort: Pulmonary effort is normal.     Breath sounds: Normal breath sounds.  Abdominal:     General: There is no distension.     Palpations: Abdomen is soft. There is no mass.     Tenderness: There is abdominal tenderness. There is no right CVA tenderness, left CVA tenderness or guarding.     Comments: Moderate generalized tenderness to palpation without guarding  Musculoskeletal:        General: Normal range of motion.     Cervical back: Normal range of motion and neck supple.  Skin:    General: Skin is warm and dry.  Neurological:     Mental Status: She is alert and oriented to person, place, and time. Mental status is at baseline.  Psychiatric:        Mood and Affect: Mood normal.        Thought Content: Thought content normal.        Judgment: Judgment normal.      UC Treatments / Results  Labs (all labs ordered are listed, but only abnormal results are displayed) Labs Reviewed - No data to display  EKG   Radiology No results found.  Procedures Procedures (including critical care time)  Medications Ordered in UC Medications - No data to display  Initial Impression / Assessment and Plan / UC Course  I have reviewed the triage vital signs and the nursing notes.  Pertinent labs & imaging results that were available during my care of the patient were reviewed by me and considered in my medical decision making (see chart for details).     Ongoing symptoms, no red flag findings today on exam but did discuss that we are limited in further evaluating her symptoms.  She states she is only here because she was told she may be dehydrated.  She is tolerating p.o. well and has stable vital signs today so discussed IV fluids were not indicated.  Discussed  to try the Zofran  for her nausea to help encourage better fluid intake and eat a bland diet as tolerated.  Follow-up with PCP as scheduled in 2 days, ED for worsening symptoms in the meantime.  Final Clinical Impressions(s) / UC Diagnoses   Final diagnoses:  Diarrhea, unspecified type  Weakness  Generalized abdominal pain     Discharge Instructions      Take Zofran  as needed to help with nausea to encourage yourself to drink more fluids.  Alternate plain water  and electrolyte solutions to stay hydrated  and eat bland foods as tolerated.  Go to the emergency department if your symptoms severely worsen.  ED Prescriptions   None    PDMP not reviewed this encounter.   Corbin Dess, PA-C 10/19/23 1723    Corbin Dess, [place] 10/19/23 1723

## 2023-10-19 NOTE — Discharge Instructions (Signed)
 Take Zofran  as needed to help with nausea to encourage yourself to drink more fluids.  Alternate plain water  and electrolyte solutions to stay hydrated and eat bland foods as tolerated.  Go to the emergency department if your symptoms severely worsen.

## 2023-10-19 NOTE — Telephone Encounter (Signed)
 noted

## 2023-10-20 ENCOUNTER — Other Ambulatory Visit: Payer: Self-pay | Admitting: *Deleted

## 2023-10-20 NOTE — Patient Instructions (Signed)
 Visit Information  Thank you for taking time to visit with me today. Please don't hesitate to contact me if I can be of assistance to you before our next scheduled telephone appointment.  Our next appointment is by telephone on 10/29/23 @ 1115 am  Following is a copy of your care plan:   Goals Addressed             This Visit's Progress    VBCI Transitions of Care (TOC) Care Plan       Problems:  Recent Hospitalization for treatment of GI bleed Patient reports she has no GI bleeding since hospital discharge but she is having "explosive diarrhea", she made an appointment to see primary care provider today, pt weighs daily, checks CBG daily, checks 02 saturation Patient reports CBG fasting is 130, weight 172 pounds, continues to have diarrhea, now has nausea, zofran  is helping, pt states she finishes antibiotic today and hopes diarrhea will subside, will follow up with primary care provider tomorrow 10/21/23, pt states she has been to ED and urgent care since last conversation regarding diarrhea, pt states " they gave me the medicine for nausea", pt states she has " some bleeding few days ago"    Denies bleeding today. Per ED/ Urgent Care- EMR states pt had no dehydration, had labs, exam and imaging with no acute findings, pt instructed to drink plain water  and electrolyte solution.  Pt unable to provide stool sample for testing, will need to be done outpatient.  Goal:  Over the next 30 days, the patient will not experience hospital readmission  Interventions:   Evaluation of current treatment plan related to GI bleed, diarrhea self-management and patient's adherence to plan as established by provider. Discussed plans with patient for ongoing care management follow up and provided patient with direct contact information for care management team In basket message sent to primary care provider reporting pt continues to have diarrhea, finishes antibiotic today, suggested stool specimen be  obtained outpatient for testing Reinforced symptoms of dehydration and importance of staying well hydrated with episodes of diarrhea Reviewed signs/ symptoms GI bleeding, reportable signs/ symptoms  Patient Self Care Activities:  Attend all scheduled provider appointments Call pharmacy for medication refills 3-7 days in advance of running out of medications Call provider office for new concerns or questions  Notify RN Care Manager of TOC call rescheduling needs Participate in Transition of Care Program/Attend TOC scheduled calls Perform all self care activities independently  Take medications as prescribed   call office if I gain more than 2 pounds in one day or 5 pounds in one week track weight in diary watch for swelling in feet, ankles and legs every day weigh myself daily develop a rescue plan follow rescue plan if symptoms flare-up Let your doctor know if you have any symptoms of GI bleeding Continue weighing daily, checking blood sugar and keeping a log Follow up with pain clinic on 5/29 @ 1 pm, primary care provider 10/21/23 Stay well hydrated- continue to drink water  and electrolyte solution  Plan:  Telephone follow up appointment with care management team member scheduled for:  10/29/23 @ 1115 am The patient has been provided with contact information for the care management team and has been advised to call with any health related questions or concerns.         Patient verbalizes understanding of instructions and care plan provided today and agrees to view in MyChart. Active MyChart status and patient understanding of how to access instructions  and care plan via MyChart confirmed with patient.     Telephone follow up appointment with care management team member scheduled for: 10/29/23 @ 1115 am  Please call the care guide team at [phone] if you need to cancel or reschedule your appointment.   Please call the Suicide and Crisis Lifeline: 988 call the [place]  National  Suicide Prevention Lifeline: 951-514-0058 or TTY: 727-629-1886 TTY 7406597293) to talk to a trained counselor call 1-800-273-TALK (toll free, 24 hour hotline) go to [facility] [address], [place] [phone]) call the [facility] Crisis Line: [phone] call 911 if you are experiencing a Mental Health or Behavioral Health Crisis or need someone to talk to.  Cecilie Coffee [facility], BSN RN Care Manager/ Transition of Care [place]/ [facility] [phone]

## 2023-10-20 NOTE — Patient Outreach (Signed)
 Transition of Care week 2  Visit Note  10/20/2023  Name: Amber Stephenson MRN: [id number]          DOB: 1946/01/24  Situation: Patient enrolled in [facility] 30-day program. Visit completed with patient by telephone.   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   Benign paroxysmal positional vertigo due to bilateral vestibular disorder 05/20/2019   Bipolar 1 disorder (HCC) 01/23/2015   with GAD, benzo dependence   CAD (coronary artery disease)    Nonobstructive; Managed by Dr. [name]   Cardiomegaly 01/12/2018   CHF (congestive heart failure) 06/08/2022   Chronic back pain 01/04/2015   Chronic constipation 04/25/2020   Chronic diastolic heart failure (HCC) 05/30/2015   Chronic pain syndrome 08/22/2019   back pain, sacroiliitis   Chronic post-traumatic stress disorder (PTSD) 12/06/2020   Diabetic neuropathy (HCC) 02/06/2016   Dyslipidemia 09/24/2020   Essential hypertension    Functional diarrhea 10/26/2020   Herpes genitalis in women 07/16/2015   History of adenomatous polyp of colon 05/21/2019   Overview:   03/31/17: Colonoscopy: nonadvanced adenoma, microscopic colitis, f/u 5 yrs, [facility]/GAP   History of radiation therapy    Endometrial- 02/18/23-03/31/23-Dr. Royston Cornea Kinard   Hypotension 09/01/2022   Insomnia 01/23/2015   Metabolic acidosis 01/12/2023   Migraine headache with aura 02/12/2016   Myofascial pain dysfunction syndrome 08/22/2019   Non-alcoholic fatty liver disease 01/12/2018   OSA (obstructive sleep apnea) 02/24/2019   10/09/2018 - HST  - AHI 40.6    Osteopenia 12/06/2020   Rx alendronate  35 mg.   Pinguecula 12/06/2020   Presbyopia 12/06/2020   Pulmonary hypertension    RLS  (restless legs syndrome) 04/27/2015   RSV (respiratory syncytial virus infection) 06/10/2023   Tremor, essential 12/11/2021   Type II diabetes mellitus (HCC) 09/24/2020    Assessment: Patient Reported Symptoms: Cognitive Cognitive Status: Able to follow simple commands, Alert and oriented to person, place, and time, Normal speech and language skills      Neurological Neurological Review of Symptoms: No symptoms reported    HEENT HEENT Symptoms Reported: No symptoms reported      Cardiovascular Cardiovascular Symptoms Reported: No symptoms reported    Respiratory Respiratory Symptoms Reported: Shortness of breath Other Respiratory Symptoms: chronic dyspnea with exertion    Endocrine Patient reports the following symptoms related to hypoglycemia or hyperglycemia : No symptoms reported Is patient diabetic?: Yes Is patient checking blood sugars at home?: Yes Endocrine Conditions: Diabetes Endocrine Management Strategies: Adequate rest, Routine screening, Medication therapy Endocrine Self-Management Outcome: 3 (uncertain)  Gastrointestinal Gastrointestinal Symptoms Reported: Nausea, Diarrhea Additional Gastrointestinal Details: pt states she went to ED and urgent care related to nausea and vomiting, pt states she is taking zofran  with relief obtained for nausea but diarrhea is not resolved, pt states she finishes antibiotic today Gastrointestinal Conditions: Diarrhea Gastrointestinal Management Strategies: Adequate rest, Diet modification, Medication therapy Gastrointestinal Self-Management Outcome: 3 (uncertain) Gastrointestinal Comment: pt states she continues to drink adequate fluids Nutrition Risk Screen (CP):  (diarrhea)  Genitourinary Genitourinary Symptoms Reported: No symptoms reported    Integumentary Integumentary Symptoms Reported: No symptoms reported    Musculoskeletal Musculoskelatal Symptoms Reviewed: No symptoms reported        Psychosocial Psychosocial Symptoms  Reported: Anxiety -  if selected complete GAD Additional Psychological Details: pt states she has ongoing anxiety, sees psychiatrist, denies depression, states she went to counseling x 15 years and chose no more counseling- not interested Behavioral Health Conditions: Bipolar affective disorder, Anxiety Behavioral Management Strategies: Adequate rest, Medication therapy Behavioral Health Self-Management Outcome: 3 (uncertain)       There were no vitals filed for this visit.  Medications Reviewed Today     Reviewed by Daralyn Earl, RN (Registered Nurse) on 10/20/23 at 1459  Med List Status: <None>   Medication Order Taking? Sig Documenting Provider Last Dose Status Informant  amoxicillin -clavulanate (AUGMENTIN ) 875-125 MG tablet [id number] No Take 1 tablet by mouth 2 (two) times daily for 10 days. Gonfa, Taye T, MD 10/19/2023 Active   ANUCORT-HC  25 MG suppository [id number] No Place 25 mg rectally daily. [provider] Taking Active Self  apixaban  (ELIQUIS ) 5 MG TABS tablet [id number] No Take 1 tablet (5 mg total) by mouth 2 (two) times daily. Jacqueline Matsu, MD 10/19/2023 Active Self  ARIPiprazole  (ABILIFY ) 2 MG tablet [id number] No Take 2 mg by mouth at bedtime. [provider] 10/18/2023 Active Self  atorvastatin  (LIPITOR) 80 MG tablet [id number] No Take 1 tablet (80 mg total) by mouth daily.  Patient taking differently: Take 80 mg by mouth every evening.   Von [place], [place] 10/18/2023 Expired 10/19/23 2359 Self  clotrimazole  (MYCELEX ) 10 MG troche [id number] No Take 10 mg by mouth in the morning and at bedtime. [provider] 10/19/2023 Active Self  Continuous Glucose Sensor (DEXCOM G7 SENSOR) MISC [id number] No CHANGE SENSOR EVERY 10 DAYS Shamleffer, Julian Obey, MD Past Month Active Self  cyanocobalamin  (VITAMIN B12) 1000 MCG tablet [id number] No Take 1 tablet (1,000 mcg total) by mouth daily. Burton Casey, MD 10/19/2023 Active Self  denosumab  (PROLIA ) 60  MG/ML SOSY injection [id number] No Inject 60 mg into the skin every 6 (six) months. [name], MD More than a month Active Self           Med Note Author Board, Curley Double Oct 07, 2023  8:01 PM) January**  escitalopram  (LEXAPRO ) 10 MG tablet [id number] No Take 10 mg by mouth daily. [provider] 10/19/2023 Active Self           Med Note (WARD, ANGELICA G   Wed Oct 07, 2023  7:56 PM) Pt needs refill  fluconazole  (DIFLUCAN ) 150 MG tablet [id number]  Take 150 mg by mouth daily. [provider]  Active   fluticasone -salmeterol (ADVAIR DISKUS) 500-50 MCG/ACT AEPB [id number] No Inhale 1 puff into the lungs in the morning and at bedtime. [name], MD Taking Active Self           Med Note (WARD, Curley Double Oct 07, 2023  8:09 PM) Pt has not started this medication yet   furosemide  (LASIX ) 40 MG tablet [id number] No Take 40 mg by mouth daily. [provider] 10/19/2023 Active Self  hydrOXYzine  (VISTARIL ) 25 MG capsule [id number] No Take 1 capsule (25 mg total) by mouth 3 (three) times daily. [name], MD 10/19/2023 Active Self  insulin  glargine (LANTUS  SOLOSTAR) 100 UNIT/ML Solostar Pen [id number] No Inject 30 Units into the skin 2 (two) times daily. Shamleffer, Julian Obey, MD 10/19/2023 Active Self  insulin  lispro (HUMALOG  KWIKPEN) 100 UNIT/ML KwikPen [id number] No Max daily 45 units  Patient taking differently: Inject 6-45 Units into the skin 3 (three) times daily. Max daily 45 units   Shamleffer, Ibtehal Jaralla,  MD 10/19/2023 Active Self           Med Note (WARD, ANGELICA G   Wed Oct 07, 2023  7:57 PM) 12 units*  Insulin  Pen Needle 29G X MISC [id number] No 1 Device by Does not apply route daily in the afternoon. Shamleffer, Julian Obey, MD Taking Active Self  LORazepam  (ATIVAN ) 0.5 MG tablet [id number] No Take 0.5-1 mg by mouth in the morning, at noon, and at bedtime. Take 0.5 mg by mouth in the morning, 0.5 mg at lunch, and 0.5 mg at bedtime [provider] 10/19/2023 Active Self           Med Note (WARD, ANGELICA G   Wed Oct 07, 2023  7:56 PM) Pt needs refill  losartan  (COZAAR ) 25 MG tablet [id number] No Take 25 mg by mouth daily. [provider] 10/19/2023 Active Self  MAGNESIUM  PO [id number] No Take 1 tablet by mouth daily. [provider] 10/19/2023 Active Self  meclizine  (ANTIVERT ) 25 MG tablet [id number] No Take 1 tablet (25 mg total) by mouth 3 (three) times daily as needed for dizziness. Dettinger, Lucio Sabin, MD More than a month Active Self  metFORMIN  (GLUCOPHAGE -XR) 500 MG 24 hr tablet [id number] No Take 2 tablets (1,000 mg total) by mouth daily with breakfast. Shamleffer, Ibtehal Jaralla, MD 10/19/2023 Active Self  Methocarbamol  1000 MG TABS [id number] No Take 1,000 mg by mouth every 6 (six) hours as needed.  Patient taking differently: Take 1,000 mg by mouth every 6 (six) hours as needed (muscle spasms).   Lovorn, Megan, MD Past Week Active Self  metoprolol  tartrate (LOPRESSOR ) 50 MG tablet [id number] No Take 1 tablet (50 mg total) by mouth 2 (two) times daily. Jacqueline Matsu, MD 10/19/2023 Active Self  naloxone  (NARCAN ) nasal spray 4 mg/0.1 mL [id number] No Place 1 spray into the nose daily as needed (for over dose). [provider] Taking Active Self           Med Note (CRUTHIS, CHLOE C   Wed Aug 19, 2023  1:55 PM)    nystatin  (MYCOSTATIN /NYSTOP ) powder [id number] No Apply 1 Application topically as needed (rash). Shamleffer, Julian Obey, MD Taking Active Self           Med Note (CRUTHIS, CHLOE C   Wed Aug 19, 2023  1:55 PM)    nystatin  ointment (MYCOSTATIN ) [id number] No Apply 1 Application topically 2 (two) times daily as needed (for rashes). Javan Messing, NP Taking Active Self           Med Note (CRUTHIS, CHLOE C   Wed Aug 19, 2023  1:55 PM)    omeprazole (PRILOSEC) 40 MG capsule [id number] No Take 40 mg by mouth daily. [provider] Taking Active Self           Med Note (WARD,  Aneta Keepers   Wed Oct 07, 2023  8:09 PM) Pt has not started this medication yet  ondansetron  (ZOFRAN ) 4 MG tablet [id number]  Take 1 tablet (4 mg total) by mouth every 8 (eight) hours as needed for nausea or vomiting. For cancer related nausea Merdis Stalling, MD  Active   OXcarbazepine  (TRILEPTAL ) 150 MG tablet [id number] No Take 1 tablet (150 mg total) by mouth 2 (two) times daily. Aura Leeds [place], [place] 10/19/2023 Active Self  Oxycodone  HCl 20 MG TABS [id number] No Take 1 tablet (20 mg total) by mouth 2 (two) times daily as needed. Do Not Fill Before 10/05/23  Patient taking differently: Take  1 tablet by mouth in the morning and at bedtime. Do Not Fill Before 10/05/23   Lovorn, Megan, MD 10/19/2023 Active Self  potassium chloride  (KLOR-CON ) 10 MEQ tablet [id number] No Take 1 tablet (10 mEq total) by mouth daily. Von [place], [place] 10/06/2023 Morning Expired 10/07/23 2359 Self  pregabalin  (LYRICA ) 200 MG capsule [id number] No Take 200 mg by mouth 2 (two) times daily. [provider] Taking Active Self           Med Note (WARD, Aneta Keepers   Wed Oct 07, 2023  7:56 PM) Pt needs refill  rOPINIRole  (REQUIP ) 1 MG tablet [id number] No Take 1 tablet (1 mg total) by mouth at bedtime. For leg cramps [name], MD 10/19/2023 Active Self  tirzepatide  (MOUNJARO ) 12.5 MG/0.5ML Pen [id number] No Inject 12.5 mg into the skin once a week. Shamleffer, Julian Obey, MD Past Week Active Self  Vitamin D , Cholecalciferol , 25 MCG (1000 UT) TABS [id number] No Take 1,000 Units by mouth daily in the afternoon. [provider] 10/19/2023 Active Self  Med List Note Levi Real, CPhT 08/29/22 1018): [employer] insurance             Goals Addressed             This Visit's Progress    [facility] Transitions of Care (TOC) Care Plan       Problems:  Recent Hospitalization for treatment of GI bleed Patient reports she has no GI bleeding since hospital discharge but she is having "explosive diarrhea", she  made an appointment to see primary care provider today, pt weighs daily, checks CBG daily, checks 02 saturation Patient reports CBG fasting is 130, weight 172 pounds, continues to have diarrhea, now has nausea, zofran  is helping, pt states she finishes antibiotic today and hopes diarrhea will subside, will follow up with primary care provider tomorrow 10/21/23, pt states she has been to ED and urgent care since last conversation regarding diarrhea, pt states " they gave me the medicine for nausea", pt states she has " some bleeding few days ago"    Denies bleeding today. Per ED/ Urgent Care- EMR states pt had no dehydration, had labs, exam and imaging with no acute findings, pt instructed to drink plain water  and electrolyte solution.  Pt unable to provide stool sample for testing, will need to be done outpatient.  Goal:  Over the next 30 days, the patient will not experience hospital readmission  Interventions:   Evaluation of current treatment plan related to GI bleed, diarrhea self-management and patient's adherence to plan as established by provider. Discussed plans with patient for ongoing care management follow up and provided patient with direct contact information for care management team In basket message sent to primary care provider reporting pt continues to have diarrhea, finishes antibiotic today, suggested stool specimen be obtained outpatient for testing Reinforced symptoms of dehydration and importance of staying well hydrated with episodes of diarrhea Reviewed signs/ symptoms GI bleeding, reportable signs/ symptoms  Patient Self Care Activities:  Attend all scheduled provider appointments Call pharmacy for medication refills 3-7 days in advance of running out of medications Call provider office for new concerns or questions  Notify RN Care Manager of TOC call rescheduling needs Participate in Transition of Care Program/Attend TOC scheduled calls Perform all self care activities  independently  Take medications as prescribed   call office if I gain more than 2 pounds in one day or 5 pounds in one week track weight in diary watch  for swelling in feet, ankles and legs every day weigh myself daily develop a rescue plan follow rescue plan if symptoms flare-up Let your doctor know if you have any symptoms of GI bleeding Continue weighing daily, checking blood sugar and keeping a log Follow up with pain clinic on 5/29 @ 1 pm, primary care provider 10/21/23 Stay well hydrated- continue to drink water  and electrolyte solution  Plan:  Telephone follow up appointment with care management team member scheduled for:  10/29/23 @ 1115 am The patient has been provided with contact information for the care management team and has been advised to call with any health related questions or concerns.         Recommendation:   PCP Follow-up- 10/21/23  Follow Up Plan:   Telephone follow-up 10/29/23 @ 1115 am  Cecilie Coffee [facility], BSN RN Care Manager/ Transition of Care [place]/ [facility] [phone]

## 2023-10-21 ENCOUNTER — Ambulatory Visit (INDEPENDENT_AMBULATORY_CARE_PROVIDER_SITE_OTHER): Admitting: Family Medicine

## 2023-10-21 VITALS — Ht 62.0 in

## 2023-10-21 DIAGNOSIS — R109 Unspecified abdominal pain: Secondary | ICD-10-CM | POA: Diagnosis not present

## 2023-10-21 DIAGNOSIS — K625 Hemorrhage of anus and rectum: Secondary | ICD-10-CM

## 2023-10-21 DIAGNOSIS — M352 Behcet's disease: Secondary | ICD-10-CM

## 2023-10-21 DIAGNOSIS — R197 Diarrhea, unspecified: Secondary | ICD-10-CM

## 2023-10-21 LAB — GLUCOSE, CAPILLARY
Glucose-Capillary: 108 mg/dL — ABNORMAL HIGH (ref 70–99)
Glucose-Capillary: 192 mg/dL — ABNORMAL HIGH (ref 70–99)

## 2023-10-21 MED ORDER — COLCHICINE 0.6 MG PO TABS: 0.6000 mg | ORAL_TABLET | Freq: Two times a day (BID) | ORAL | 1 refills | Status: DC

## 2023-10-21 MED ORDER — BUDESONIDE 3 MG PO CPEP: 3.0000 mg | ORAL_CAPSULE | Freq: Two times a day (BID) | ORAL | 0 refills | Status: DC

## 2023-10-21 MED ORDER — DIPHENOXYLATE-ATROPINE 2.5-0.025 MG PO TABS: 2.0000 | ORAL_TABLET | Freq: Four times a day (QID) | ORAL | 0 refills | Status: DC | PRN

## 2023-10-21 NOTE — Progress Notes (Unsigned)
 Subjective:  Patient ID: Amber Stephenson, female    DOB: 1945/09/30  Age: 78 y.o. MRN: [id number]  CC: ER follow up (No C diff but doing better/Still having diarrhea and blood and stool. Dry mouth and raw inside. Fatigued.  Nausea and stomach cramps.  ) and Referral (Gastroenterology and would like colonscopy)   HPI Amber Stephenson presents for patient is in today for follow-up on her diarrhea for which she went to the emergency room a few days ago.  She is still having some blood.  She still feels nauseous and describes the abdominal discomfort as stomach cramps.  She had been admitted on 7 May for this.  Went back to the emergency room on the 12th and again on the 19th.  Although she reports nausea she has not been taking the ondansetron .  Appetite has been poor.  Mouth sores persist.  They have not responded to multiple treatments for yeast and other etiologies.  She is currently taking Diflucan .  Her diabetes appears to be stable through this time.     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), Benign paroxysmal positional vertigo due to bilateral vestibular disorder (05/20/2019), Bipolar 1 disorder (HCC) (01/23/2015), CAD (coronary artery disease), Cardiomegaly (01/12/2018), CHF (congestive heart failure) (06/08/2022), Chronic back pain (01/04/2015), Chronic constipation (04/25/2020), Chronic diastolic heart failure (HCC) (40/98/1191), Chronic pain syndrome (08/22/2019), Chronic post-traumatic stress disorder (PTSD) (12/06/2020), Diabetic neuropathy (HCC) (02/06/2016),  Dyslipidemia (09/24/2020), Essential hypertension, Functional diarrhea (10/26/2020), Herpes genitalis in women (07/16/2015), History of adenomatous polyp of colon (05/21/2019), History of radiation therapy, Hypotension (09/01/2022), Insomnia (01/23/2015), Metabolic acidosis (01/12/2023), Migraine headache with aura (02/12/2016), Myofascial pain dysfunction syndrome (08/22/2019), Non-alcoholic fatty liver disease (47/82/9562), OSA (obstructive sleep apnea) (02/24/2019), Osteopenia (12/06/2020), Pinguecula (12/06/2020), Presbyopia (12/06/2020), Pulmonary hypertension, RLS (restless legs syndrome) (04/27/2015), RSV (respiratory syncytial virus infection) (06/10/2023), Tremor, essential (12/11/2021), and Type II diabetes mellitus (HCC) (09/24/2020).   She has a past surgical history that includes THIGH SURGERY; Shoulder surgery (Right); Breast reduction surgery; Eye surgery (Right); Hammer toe surgery; LEFT HEART CATH AND CORONARY ANGIOGRAPHY (N/A, 02/03/2018); Reverse shoulder arthroplasty (Right, 08/19/2019); and Colonoscopy (2018).   Her family history includes Alcohol  abuse in her sister; Alzheimer's disease in her father; Diabetes in her brother, mother, and sister; Heart disease in her brother, mother, and sister; Mental illness in her brother; Stroke in her brother.She reports that she has never smoked. She has never used smokeless tobacco. She reports that she does not currently use alcohol . She reports that she does not use drugs.    ROS Review of Systems  Constitutional: Negative.   HENT:  Positive for mouth sores. Negative for congestion.   Eyes:  Negative for visual disturbance.  Respiratory:  Negative for shortness of breath.   Cardiovascular:  Negative for chest pain.  Gastrointestinal:  Positive for abdominal pain, diarrhea and nausea. Negative for constipation and vomiting.  Genitourinary:  Negative for difficulty urinating.  Musculoskeletal:  Positive for arthralgias. Negative for  myalgias.  Neurological:  Negative for headaches.  Psychiatric/Behavioral:  Negative for sleep disturbance.  Objective:  Ht 5\' 2"  (1.575 m)   BMI 32.92 kg/m   BP Readings from Last 3 Encounters:  10/23/23 134/81  10/19/23 118/79  10/12/23 125/71    Wt Readings from Last 3 Encounters:  10/23/23 184 lb 9.6 oz (83.7 kg)  10/12/23 180 lb (81.6 kg)  10/12/23 182 lb (82.6 kg)     Physical Exam Constitutional:      Appearance: She is well-developed. She is ill-appearing.  HENT:     Head: Normocephalic and atraumatic.  Eyes:     Conjunctiva/sclera: Conjunctivae normal.     Pupils: Pupils are equal, round, and reactive to light.  Neck:     Thyroid : No thyromegaly.  Cardiovascular:     Rate and Rhythm: Normal rate and regular rhythm.     Heart sounds: Normal heart sounds. No murmur heard. Pulmonary:     Effort: Pulmonary effort is normal. No respiratory distress.     Breath sounds: Normal breath sounds. No wheezing or rales.  Abdominal:     General: Bowel sounds are normal. There is distension.     Palpations: Abdomen is soft. There is no mass.     Tenderness: There is abdominal tenderness. There is guarding. There is no rebound.     Hernia: No hernia is present.  Musculoskeletal:        General: Normal range of motion.     Cervical back: Normal range of motion and neck supple.  Lymphadenopathy:     Cervical: No cervical adenopathy.  Skin:    General: Skin is warm and dry.  Neurological:     Mental Status: She is alert and oriented to person, place, and time.  Psychiatric:        Behavior: Behavior normal.        Thought Content: Thought content normal.        Judgment: Judgment normal.      Assessment & Plan:  Rectal bleeding -     Ambulatory referral to Gastroenterology -     Budesonide ; Take 1 capsule (3 mg total) by mouth 2 (two) times daily.  Dispense: 60 capsule; Refill: 0  Diarrhea, unspecified type -     Ambulatory referral to Gastroenterology -      Ambulatory referral to Gastroenterology -     Budesonide ; Take 1 capsule (3 mg total) by mouth 2 (two) times daily.  Dispense: 60 capsule; Refill: 0 -     Diphenoxylate -Atropine ; Take 2 tablets by mouth 4 (four) times daily as needed for diarrhea or loose stools.  Dispense: 30 tablet; Refill: 0  Abdominal pain, unspecified abdominal location -     Ambulatory referral to Gastroenterology -     Budesonide ; Take 1 capsule (3 mg total) by mouth 2 (two) times daily.  Dispense: 60 capsule; Refill: 0  Behcet's syndrome (HCC) -     Colchicine ; Take 1 tablet (0.6 mg total) by mouth 2 (two) times daily. For mouth sores  Dispense: 60 tablet; Refill: 1     Follow-up: Return in about 1 week (around 10/28/2023).  [name], M.D.

## 2023-10-22 DIAGNOSIS — F251 Schizoaffective disorder, depressive type: Secondary | ICD-10-CM | POA: Diagnosis not present

## 2023-10-22 NOTE — Progress Notes (Signed)
 Referring Provider: [name], MD Primary Care Physician:  [name], MD Primary Gastroenterologist:  Dr. Riley Cheadle  Chief Complaint  Patient presents with   Rectal Bleeding    Having rectal bleeding for over a month and diarrhea. Seen a GI doctor in [place], but they did not find out what was wrong.     HPI:   Amber Stephenson is a 78 y.o. female  with endometrioid endometrial cancer, medically inoperable, s/p IMRT, Afib,  bipolar 1 disorder, CAD, CHF, chronic pain, DM, HTN, Herpes, NAFLD, OSA, osteopenia, RLS, pulmonary htn  presenting today at the request of [name], MD for rectal bleeding, diarrhea, abdominal pain.  We saw patient during hospitalization in early May, hospitalized 5/7 - 5/10.  She presented to the emergency room with rectal bleeding that had apparently been going on since late 2023.  Reported she was seeing [facility] and in the process of obtaining cardiac clearance for bidirectional endoscopic evaluation.  She reported diffuse abdominal pain with nausea.  Initial hemoglobin was 12.1.  CT A/P with contrast with minimal perisigmoid haziness, chronic versus mild acute diverticulitis.  She was treated with antibiotics with improvement in her abdominal pain.  Hemoglobin remained stable in the 10-11 range.  Recommended outpatient colonoscopy and EGD with primary GI ([facility]) in 6-8 weeks.  She was discharged on a 10-day course of Augmentin .    Today:  Abdominal pain improved some with antibiotics which she just finished yesterday, but still present. Feels pain primarily first thing in the morning. States it can be bad when it flares up. Mostly on the sides and in lower abdomen.    Diarrhea started no long after hospital discharge. Was having 15 Bms per day.  Also nocturnal stools except for last night.  BM this morning was still runny.  Took lomotil  x2 yesterday afternoon and 1 this morning. This was prescribed by Dr. Veleta Gerold.    Rectal bleeding is every  day for the last couple of weeks. With or without a BM. Can be in water , stool, and on tissue. Bright red and black. Clots of "tissue" will be black. These symptoms date back about 6 moths however, but previously much less frequent.   Some nausea but no vomiting the last few days. Doesn't have a great appetite. No pain with eating. Gets full quickly.    Weight loss: Reports some weight loss in the last 6-8 months. Reports maybe close to 20 lb weight loss.   Reports receiving call this week that C diff test was negative.  However, I do not see that C diff order has resulted.    Feeling weak.   No NSAIDs.   Colonoscopy 2018: hemorrhoids 6mm polyp in cecum, diverticulosis in sigmoid.  Pathology with tubular adenoma.  Random colon biopsies consistent with microscopic colitis.   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   Benign paroxysmal positional vertigo due to bilateral vestibular disorder 05/20/2019   Bipolar 1 disorder (HCC) 01/23/2015   with GAD, benzo dependence   CAD (coronary artery disease)    Nonobstructive; Managed by Dr. [name]   Cardiomegaly 01/12/2018   CHF (congestive heart failure) 06/08/2022   Chronic back pain 01/04/2015   Chronic constipation 04/25/2020   Chronic diastolic heart  failure (HCC) 05/30/2015   Chronic pain syndrome 08/22/2019   back pain, sacroiliitis   Chronic post-traumatic stress disorder (PTSD) 12/06/2020   Diabetic neuropathy (HCC) 02/06/2016   Dyslipidemia 09/24/2020   Essential hypertension    Functional diarrhea 10/26/2020   Herpes genitalis in women 07/16/2015   History of adenomatous polyp of colon 05/21/2019   Overview:   03/31/17: Colonoscopy: nonadvanced  adenoma, microscopic colitis, f/u 5 yrs, [facility]/GAP   History of radiation therapy    Endometrial- 02/18/23-03/31/23-Dr. Royston Cornea Kinard   Hypotension 09/01/2022   Insomnia 01/23/2015   Metabolic acidosis 01/12/2023   Migraine headache with aura 02/12/2016   Myofascial pain dysfunction syndrome 08/22/2019   Non-alcoholic fatty liver disease 01/12/2018   OSA (obstructive sleep apnea) 02/24/2019   10/09/2018 - HST  - AHI 40.6    Osteopenia 12/06/2020   Rx alendronate  35 mg.   Pinguecula 12/06/2020   Presbyopia 12/06/2020   Pulmonary hypertension    RLS (restless legs syndrome) 04/27/2015   RSV (respiratory syncytial virus infection) 06/10/2023   Tremor, essential 12/11/2021   Type II diabetes mellitus (HCC) 09/24/2020    Past Surgical History:  Procedure Laterality Date   BREAST REDUCTION SURGERY     COLONOSCOPY  2018   6 mm cecal tubular adenoma, sigmoid diverticulosis, random colon biopsies consistent with microscopic colitis   EYE SURGERY Right    cateracts   HAMMER TOE SURGERY     LEFT HEART CATH AND CORONARY ANGIOGRAPHY N/A 02/03/2018   Procedure: LEFT HEART CATH AND CORONARY ANGIOGRAPHY;  Surgeon: [name], Peter M, MD;  Location: [facility] INVASIVE CV LAB;  Service: Cardiovascular;  Laterality: N/A;   REVERSE SHOULDER ARTHROPLASTY Right 08/19/2019   Procedure: REVERSE SHOULDER ARTHROPLASTY;  Surgeon: Winston Hawking, MD;  Location: [facility] ORS;  Service: Orthopedics;  Laterality: Right;  interscalene block   SHOULDER SURGERY Right    "I BROKE MY SHOUDLER   THIGH SURGERY     "TO REMOVE A TUMOR "    Current Outpatient Medications  Medication Sig Dispense Refill   ANUCORT-HC  25 MG suppository Place 25 mg rectally daily.     apixaban  (ELIQUIS ) 5 MG TABS tablet Take 1 tablet (5 mg total) by mouth 2 (two) times daily. 180 tablet 3   ARIPiprazole  (ABILIFY ) 2 MG tablet Take 2 mg by mouth at bedtime.     atorvastatin  (LIPITOR) 80 MG tablet Take 1 tablet (80 mg total) by mouth daily. (Patient  taking differently: Take 80 mg by mouth every evening.) 90 tablet 3   budesonide  (ENTOCORT EC ) 3 MG 24 hr capsule Take 1 capsule (3 mg total) by mouth 2 (two) times daily. 60 capsule 0   clotrimazole  (MYCELEX ) 10 MG troche Take 10 mg by mouth in the morning and at bedtime.     colchicine  0.6 MG tablet Take 1 tablet (0.6 mg total) by mouth 2 (two) times daily. For mouth sores 60 tablet 1   Continuous Glucose Sensor (DEXCOM G7 SENSOR) MISC CHANGE SENSOR EVERY 10 DAYS 9 each 3   cyanocobalamin  (VITAMIN B12) 1000 MCG tablet Take 1 tablet (1,000 mcg total) by mouth daily. 30 tablet 1   denosumab  (PROLIA ) 60 MG/ML SOSY injection Inject 60 mg into the skin every 6 (six) months. 1 mL 1   diphenoxylate -atropine  (LOMOTIL ) 2.5-0.025 MG tablet Take 2 tablets by mouth 4 (four) times daily as needed for diarrhea or loose stools. 30 tablet 0   escitalopram  (LEXAPRO ) 10 MG tablet Take 10 mg by mouth daily.     fluticasone -salmeterol (  ADVAIR DISKUS) 500-50 MCG/ACT AEPB Inhale 1 puff into the lungs in the morning and at bedtime. 60 each 5   furosemide  (LASIX ) 40 MG tablet Take 40 mg by mouth daily.     hydrOXYzine  (VISTARIL ) 25 MG capsule Take 1 capsule (25 mg total) by mouth 3 (three) times daily. 30 capsule 5   insulin  glargine (LANTUS  SOLOSTAR) 100 UNIT/ML Solostar Pen Inject 30 Units into the skin 2 (two) times daily. 60 mL 4   insulin  lispro (HUMALOG  KWIKPEN) 100 UNIT/ML KwikPen Max daily 45 units (Patient taking differently: Inject 6-45 Units into the skin 3 (three) times daily. Max daily 45 units) 45 mL 6   Insulin  Pen Needle 29G X MISC 1 Device by Does not apply route daily in the afternoon. 400 each 3   LORazepam  (ATIVAN ) 0.5 MG tablet Take 0.5-1 mg by mouth in the morning, at noon, and at bedtime. Take 0.5 mg by mouth in the morning, 0.5 mg at lunch, and 0.5 mg at bedtime     losartan  (COZAAR ) 25 MG tablet Take 25 mg by mouth daily.     MAGNESIUM  PO Take 1 tablet by mouth daily.     meclizine   (ANTIVERT ) 25 MG tablet Take 1 tablet (25 mg total) by mouth 3 (three) times daily as needed for dizziness. 60 tablet 1   metFORMIN  (GLUCOPHAGE -XR) 500 MG 24 hr tablet Take 2 tablets (1,000 mg total) by mouth daily with breakfast. 180 tablet 3   Methocarbamol  1000 MG TABS Take 1,000 mg by mouth every 6 (six) hours as needed. (Patient taking differently: Take 1,000 mg by mouth every 6 (six) hours as needed (muscle spasms).) 120 tablet 5   metoprolol  tartrate (LOPRESSOR ) 50 MG tablet Take 1 tablet (50 mg total) by mouth 2 (two) times daily. 180 tablet 2   naloxone  (NARCAN ) nasal spray 4 mg/0.1 mL Place 1 spray into the nose daily as needed (for over dose).     nystatin  (MYCOSTATIN /NYSTOP ) powder Apply 1 Application topically as needed (rash). 15 g 2   nystatin  ointment (MYCOSTATIN ) Apply 1 Application topically 2 (two) times daily as needed (for rashes). 30 g 1   omeprazole (PRILOSEC) 40 MG capsule Take 1 capsule (40 mg total) by mouth 2 (two) times daily before a meal. 60 capsule 3   ondansetron  (ZOFRAN ) 4 MG tablet Take 1 tablet (4 mg total) by mouth every 8 (eight) hours as needed for nausea or vomiting. For cancer related nausea 90 tablet 5   OXcarbazepine  (TRILEPTAL ) 150 MG tablet Take 1 tablet (150 mg total) by mouth 2 (two) times daily. 60 tablet 0   Oxycodone  HCl 20 MG TABS Take 1 tablet (20 mg total) by mouth 2 (two) times daily as needed. Do Not Fill Before 10/05/23 (Patient taking differently: Take 1 tablet by mouth in the morning and at bedtime. Do Not Fill Before 10/05/23) 60 tablet 0   potassium chloride  (KLOR-CON ) 10 MEQ tablet Take 1 tablet (10 mEq total) by mouth daily. 90 tablet 3   rOPINIRole  (REQUIP ) 1 MG tablet Take 1 tablet (1 mg total) by mouth at bedtime. For leg cramps 30 tablet 5   tirzepatide  (MOUNJARO ) 12.5 MG/0.5ML Pen Inject 12.5 mg into the skin once a week. 6 mL 3   Vitamin D , Cholecalciferol , 25 MCG (1000 UT) TABS Take 1,000 Units by mouth daily in the afternoon.      pregabalin  (LYRICA ) 200 MG capsule Take 200 mg by mouth 2 (two) times daily. (Patient not taking: Reported  on 10/23/2023)     No current facility-administered medications for this visit.   Facility-Administered Medications Ordered in Other Visits  Medication Dose Route Frequency Provider Last Rate Last Admin   bupivacaine -epinephrine  (MARCAINE  W/ EPI) 0.5% -1:200000 injection    Anesthesia Intra-op Hollis, Kevin D, MD   12 mL at 01/21/19 0935    Allergies as of 10/23/2023 - Review Complete 10/23/2023  Allergen Reaction Noted   Iodine Anaphylaxis 06/28/2003   Ivp dye [iodinated contrast media] Anaphylaxis, Swelling, and Other (See Comments) 12/13/2014   Latex Other (See Comments) 09/29/2018   Tape Other (See Comments) 01/28/2023   Tizanidine  Other (See Comments) 03/10/2022    Family History  Problem Relation Age of Onset   Diabetes Mother    Heart disease Mother    Alzheimer's disease Father    Heart disease Sister        CABG   Diabetes Sister    Alcohol  abuse Sister    Stroke Brother    Heart disease Brother    Mental illness Brother    Diabetes Brother    Breast cancer Neg Hx    Ovarian cancer Neg Hx    Colon cancer Neg Hx    Endometrial cancer Neg Hx     Social History   Socioeconomic History   Marital status: Married    Spouse name: Financial planner   Number of children: 3   Years of education: 15   Highest education level: Associate degree: academic program  Occupational History   Occupation: Retired    Comment: Chief Financial Officer  Tobacco Use   Smoking status: Never   Smokeless tobacco: Never  Vaping Use   Vaping status: Never Used  Substance and Sexual Activity   Alcohol  use: Not Currently    Alcohol /week: 0.0 standard drinks of alcohol    Drug use: No   Sexual activity: Yes    Partners: Male    Birth control/protection: Post-menopausal  Other Topics Concern   Not on file  Social History Narrative   Lives at home with husband.    They have 3 children who live away -  [place] , [place] , and [place] .   She is from [place]  and most of her family lives there.   Her husbands's family live nearby   Right handed.   Social Drivers of Corporate investment banker Strain: Low Risk  (08/13/2023)   Overall Financial Resource Strain (CARDIA)    Difficulty of Paying Living Expenses: Not hard at all  Food Insecurity: No Food Insecurity (10/12/2023)   Hunger Vital Sign    Worried About Running Out of Food in the Last Year: Never true    Ran Out of Food in the Last Year: Never true  Transportation Needs: No Transportation Needs (10/12/2023)   PRAPARE - Administrator, Civil Service (Medical): No    Lack of Transportation (Non-Medical): No  Physical Activity: Insufficiently Active (08/13/2023)   Exercise Vital Sign    Days of Exercise per Week: 3 days    Minutes of Exercise per Session: 20 min  Stress: No Stress Concern Present (08/13/2023)   [employer] of Occupational Health - Occupational Stress Questionnaire    Feeling of Stress : Only a little  Social Connections: Moderately Integrated (10/07/2023)   Social Connection and Isolation Panel [NHANES]    Frequency of Communication with Friends and Family: More than three times a week    Frequency of Social Gatherings with Friends and Family: Once a week    Attends Religious Services:  More than 4 times per year    Active Member of Clubs or Organizations: No    Attends Banker Meetings: Never    Marital Status: Married  Catering manager Violence: Not At Risk (10/12/2023)   Humiliation, Afraid, Rape, and Kick questionnaire    Fear of Current or Ex-Partner: No    Emotionally Abused: No    Physically Abused: No    Sexually Abused: No    Review of Systems: Gen: Denies any fever, chills, cold or flu like symptoms, pre-syncope, or syncope.  CV: Denies chest pain, heart palpitations. Resp: Chronic SOB, but some worse recently.  GI: See HPI Heme: See HPI  Physical Exam: BP 134/81 (BP  Location: Right Arm, Patient Position: Sitting, Cuff Size: Large)   Pulse 96   Temp 97.8 F (36.6 C) (Temporal)   Ht 5\' 2"  (1.575 m)   Wt 184 lb 9.6 oz (83.7 kg)   BMI 33.76 kg/m  General:   Alert and oriented. Pleasant and cooperative. Well-nourished and well-developed.  Head:  Normocephalic and atraumatic. Eyes:  Without icterus, sclera clear and conjunctiva pink.  Ears:  Normal auditory acuity. Lungs:  Clear to auscultation bilaterally. No wheezes, rales, or rhonchi. No distress.  Heart:  S1, S2 present without murmurs appreciated.  Abdomen:  +BS, obese, soft, moderate to significant generalized TTP, greatest across mid and lower abdomen. No HSM noted. No guarding or rebound. No masses appreciated.  Rectal:  Deferred  Msk:  Symmetrical without gross deformities. Normal posture. Extremities:  Without edema. Neurologic:  Alert and  oriented x4;  grossly normal neurologically. Skin:  Intact without significant lesions or rashes. Psych: Normal mood and affect.    Assessment:  78 y.o. female  with endometrioid endometrial cancer, medically inoperable, s/p IMRT, Afib,  bipolar 1 disorder, CAD, CHF, chronic pain, DM, HTN, Herpes, NAFLD, OSA, osteopenia, RLS, pulmonary htn, adenomatous colon polyp, microscopic colitis, presenting today at the request of [name], MD for rectal bleeding, diarrhea, abdominal pain.    Generalized abdominal pain/history of diverticulitis: Hospitalized 5/7 - 5/10 with uncomplicated sigmoid diverticulitis.  She was treated inpatient with IV antibiotics and discharged home with a 10-day course of Augmentin  which she just completed yesterday.  Reports mild improvement in abdominal pain, but has not resolved.  On exam, she has moderate to significant generalized tenderness to palpation, greatest across the mid and lower abdomen without rebound or guarding.  Due to physical exam findings, I am concerned about persistent diverticulitis and unable to rule out  complicating feature.  Will need to repeat CT for further evaluation.  Ultimately, she will need an updated colonoscopy in 6-8 weeks to follow-up on diverticulitis as well as rectal bleeding as discussed below.  Rectal bleeding/black stool: Intermittent for the last 6 months, but increased recently, now occurring daily.  Reports bright red blood per rectum and also passing blood clots per rectum on a daily basis at this point.  Also reporting increasing fatigue and worsening baseline shortness of breath.  This is concerning for drop in her hemoglobin.  Notably, during recent hospitalization with rectal bleeding, she presented with a hemoglobin of 12.1 and hemoglobin remained stable in the 10-11 range.  Last CBC on file with hemoglobin 11.2 on 10/12/2023.  No endoscopic evaluation was pursued during hospitalization due to diverticulitis and recommended outpatient EGD and colonoscopy in 6-8 weeks.  This will still be the current recommendation, but will need to update labs to ensure no significant drop in her hemoglobin.  Etiology of her bleeding is not clear.  Denies NSAIDs as she is chronically anticoagulated on Eliquis  and this could be bleeding from essentially anywhere in her GI tract.  Aside from diverticulitis and abdominal pain as per above, she does have early satiety, lack of appetite, and some nausea recently and could have PUD, gastritis, duodenitis, H. Pylori.  May also have AVMs, polyps, and unable to rule out malignancy contributing to her bleeding. Will go ahead and start her on omeprazole  40 mg BID for upper GI protection.   Diarrhea: New onset since hospital discharge on 10/10/2023 where she was diagnosed with sigmoid diverticulitis and started on Augmentin .  Diarrhea may be antibiotic related, but she reports 15+ bowel movements during the day as well as nocturnal stools which does raise the concern for infectious process.  Also note that she has a history of microscopic colitis, but appears  that this was not causing her any issues recently.  She does report a stool test with Dr. Veleta Gerold this past week with negative C. difficile result, but I do not have any results to refer to.  I will plan to update GI pathogen panel for completeness which also contains C. difficile testing.  If negative, would be okay to resume taking Lomotil  to control diarrhea as prescribed by Dr. Veleta Gerold a couple days ago until time of colonoscopy for further evaluation.  Early satiety/lack of appetite/nausea: Etiology unclear.  May be related to recent diverticulitis, antibiotics, but unable to rule out gastritis, duodenitis, PUD, H. Pylori especially in the setting of reported black stool.  Planning for EGD in the near future.     Plan:  CT A/P with oral contrast only stat CBC, CMP, GI profile panel Hold Lomotil  until infectious diarrhea is ruled out. Start omeprazole  40 mg twice daily Further recommendations pending CT and labs.   Shana Daring, PA-C [facility] 10/23/2023

## 2023-10-23 ENCOUNTER — Ambulatory Visit (HOSPITAL_COMMUNITY)
Admission: RE | Admit: 2023-10-23 | Discharge: 2023-10-23 | Disposition: A | Discharge status: Home / Self Care | Source: Ambulatory Visit | Attending: Psychiatry | Admitting: Psychiatry

## 2023-10-23 ENCOUNTER — Ambulatory Visit: Payer: Self-pay | Admitting: Gastroenterology

## 2023-10-23 ENCOUNTER — Other Ambulatory Visit (HOSPITAL_COMMUNITY)
Admission: RE | Admit: 2023-10-23 | Discharge: 2023-10-23 | Disposition: A | Discharge status: Home / Self Care | Source: Ambulatory Visit | Attending: Psychiatry | Admitting: Psychiatry

## 2023-10-23 ENCOUNTER — Ambulatory Visit (INDEPENDENT_AMBULATORY_CARE_PROVIDER_SITE_OTHER): Admitting: Gastroenterology

## 2023-10-23 ENCOUNTER — Encounter: Payer: Self-pay | Admitting: Gastroenterology

## 2023-10-23 VITALS — BP 134/81 | HR 96 | Temp 97.8°F | Ht 62.0 in | Wt 184.6 lb

## 2023-10-23 DIAGNOSIS — R1084 Generalized abdominal pain: Secondary | ICD-10-CM

## 2023-10-23 DIAGNOSIS — M79672 Pain in left foot: Secondary | ICD-10-CM | POA: Diagnosis not present

## 2023-10-23 DIAGNOSIS — K4090 Unilateral inguinal hernia, without obstruction or gangrene, not specified as recurrent: Secondary | ICD-10-CM | POA: Diagnosis not present

## 2023-10-23 DIAGNOSIS — R5383 Other fatigue: Secondary | ICD-10-CM | POA: Diagnosis not present

## 2023-10-23 DIAGNOSIS — R630 Anorexia: Secondary | ICD-10-CM | POA: Diagnosis not present

## 2023-10-23 DIAGNOSIS — R110 Nausea: Secondary | ICD-10-CM

## 2023-10-23 DIAGNOSIS — K5730 Diverticulosis of large intestine without perforation or abscess without bleeding: Secondary | ICD-10-CM | POA: Diagnosis not present

## 2023-10-23 DIAGNOSIS — K625 Hemorrhage of anus and rectum: Secondary | ICD-10-CM | POA: Diagnosis not present

## 2023-10-23 DIAGNOSIS — Z8719 Personal history of other diseases of the digestive system: Secondary | ICD-10-CM

## 2023-10-23 DIAGNOSIS — K921 Melena: Secondary | ICD-10-CM

## 2023-10-23 DIAGNOSIS — K5732 Diverticulitis of large intestine without perforation or abscess without bleeding: Secondary | ICD-10-CM

## 2023-10-23 DIAGNOSIS — R6881 Early satiety: Secondary | ICD-10-CM

## 2023-10-23 DIAGNOSIS — I998 Other disorder of circulatory system: Secondary | ICD-10-CM | POA: Diagnosis not present

## 2023-10-23 DIAGNOSIS — R197 Diarrhea, unspecified: Secondary | ICD-10-CM | POA: Diagnosis not present

## 2023-10-23 LAB — COMPREHENSIVE METABOLIC PANEL WITH GFR
ALT: 9 U/L (ref 0–44)
AST: 12 U/L — ABNORMAL LOW (ref 15–41)
Albumin: 3 g/dL — ABNORMAL LOW (ref 3.5–5.0)
Alkaline Phosphatase: 49 U/L (ref 38–126)
Anion gap: 5 (ref 5–15)
BUN: 10 mg/dL (ref 8–23)
CO2: 22 mmol/L (ref 22–32)
Calcium: 8.3 mg/dL — ABNORMAL LOW (ref 8.9–10.3)
Chloride: 103 mmol/L (ref 98–111)
Creatinine, Ser: 0.66 mg/dL (ref 0.44–1.00)
GFR, Estimated: >60 mL/min (ref >60)
Glucose, Bld: 315 mg/dL — ABNORMAL HIGH (ref 70–99)
Potassium: 5.2 mmol/L — ABNORMAL HIGH (ref 3.5–5.1)
Sodium: 130 mmol/L — ABNORMAL LOW (ref 135–145)
Total Bilirubin: 0.6 mg/dL (ref 0.0–1.2)
Total Protein: 6.2 g/dL — ABNORMAL LOW (ref 6.5–8.1)

## 2023-10-23 LAB — CBC WITH DIFFERENTIAL/PLATELET
Abs Immature Granulocytes: 0.03 10*3/uL (ref 0.00–0.07)
Basophils Absolute: 0 10*3/uL (ref 0.0–0.1)
Basophils Relative: 0 %
Eosinophils Absolute: 0 10*3/uL (ref 0.0–0.5)
Eosinophils Relative: 1 %
HCT: 32.6 % — ABNORMAL LOW (ref 36.0–46.0)
Hemoglobin: 10.6 g/dL — ABNORMAL LOW (ref 12.0–15.0)
Immature Granulocytes: 1 %
Lymphocytes Relative: 10 %
Lymphs Abs: 0.6 10*3/uL — ABNORMAL LOW (ref 0.7–4.0)
MCH: 29.9 pg (ref 26.0–34.0)
MCHC: 32.5 g/dL (ref 30.0–36.0)
MCV: 91.8 fL (ref 80.0–100.0)
Monocytes Absolute: 0.5 10*3/uL (ref 0.1–1.0)
Monocytes Relative: 8 %
Neutro Abs: 4.7 10*3/uL (ref 1.7–7.7)
Neutrophils Relative %: 80 %
Platelets: 254 10*3/uL (ref 150–400)
RBC: 3.55 MIL/uL — ABNORMAL LOW (ref 3.87–5.11)
RDW: 14.8 % (ref 11.5–15.5)
WBC: 5.9 10*3/uL (ref 4.0–10.5)
nRBC: 0 % (ref 0.0–0.2)

## 2023-10-23 MED ORDER — ONDANSETRON HCL 4 MG PO TABS: 4.0000 mg | ORAL_TABLET | Freq: Three times a day (TID) | ORAL | 0 refills | Status: DC | PRN

## 2023-10-23 MED ORDER — METRONIDAZOLE 500 MG PO TABS: 500.0000 mg | ORAL_TABLET | Freq: Three times a day (TID) | ORAL | 0 refills | Status: AC

## 2023-10-23 MED ORDER — OMEPRAZOLE 40 MG PO CPDR: 40.0000 mg | DELAYED_RELEASE_CAPSULE | Freq: Two times a day (BID) | ORAL | 3 refills | Status: DC

## 2023-10-23 MED ORDER — IOHEXOL 9 MG/ML PO SOLN
ORAL | Status: AC
  Filled 2023-10-23: qty 1000

## 2023-10-23 MED ORDER — CIPROFLOXACIN HCL 500 MG PO TABS: 500.0000 mg | ORAL_TABLET | Freq: Two times a day (BID) | ORAL | 0 refills | Status: AC

## 2023-10-23 NOTE — Patient Instructions (Addendum)
 WE will arrange for you to have a CT of your abdomen and pelvis at [facility].   Please have labs completed at [facility].   Hold Lomotil  for now until we rule out infection.   Start omeprazole 40 mg twice daily 30 minutes before breakfast and dinner.   Further recommendations to follow.   Shana Daring, PA-C [facility]

## 2023-10-25 ENCOUNTER — Encounter: Payer: Self-pay | Admitting: Family Medicine

## 2023-10-27 ENCOUNTER — Other Ambulatory Visit: Payer: Self-pay | Admitting: *Deleted

## 2023-10-27 DIAGNOSIS — K625 Hemorrhage of anus and rectum: Secondary | ICD-10-CM

## 2023-10-27 DIAGNOSIS — R197 Diarrhea, unspecified: Secondary | ICD-10-CM

## 2023-10-29 ENCOUNTER — Encounter: Payer: Self-pay | Admitting: Registered Nurse

## 2023-10-29 ENCOUNTER — Encounter: Attending: Physical Medicine and Rehabilitation | Admitting: Registered Nurse

## 2023-10-29 ENCOUNTER — Other Ambulatory Visit: Payer: Self-pay | Admitting: *Deleted

## 2023-10-29 VITALS — BP 119/73 | HR 91 | Ht 62.0 in | Wt 180.8 lb

## 2023-10-29 DIAGNOSIS — Z5181 Encounter for therapeutic drug level monitoring: Secondary | ICD-10-CM | POA: Diagnosis not present

## 2023-10-29 DIAGNOSIS — W19XXXD Unspecified fall, subsequent encounter: Secondary | ICD-10-CM

## 2023-10-29 DIAGNOSIS — W19XXXA Unspecified fall, initial encounter: Secondary | ICD-10-CM | POA: Insufficient documentation

## 2023-10-29 DIAGNOSIS — M25511 Pain in right shoulder: Secondary | ICD-10-CM | POA: Insufficient documentation

## 2023-10-29 DIAGNOSIS — G894 Chronic pain syndrome: Secondary | ICD-10-CM

## 2023-10-29 DIAGNOSIS — G8929 Other chronic pain: Secondary | ICD-10-CM | POA: Diagnosis not present

## 2023-10-29 DIAGNOSIS — M545 Low back pain, unspecified: Secondary | ICD-10-CM | POA: Diagnosis not present

## 2023-10-29 DIAGNOSIS — Z79891 Long term (current) use of opiate analgesic: Secondary | ICD-10-CM | POA: Insufficient documentation

## 2023-10-29 DIAGNOSIS — M25512 Pain in left shoulder: Secondary | ICD-10-CM | POA: Diagnosis not present

## 2023-10-29 MED ORDER — OXYCODONE HCL ER 20 MG PO T12A: 20.0000 mg | EXTENDED_RELEASE_TABLET | Freq: Two times a day (BID) | ORAL | 0 refills | Status: DC

## 2023-10-29 NOTE — Progress Notes (Signed)
 Subjective:    Patient ID: Amber Stephenson, female    DOB: 20-Sep-1945, 78 y.o.   MRN: [id number]  HPI: Amber Stephenson is a 78 y.o. female who returns for follow up appointment for chronic pain and medication refill. She states her pain is located in her bilateral shoulders and lower back pain. She rates her pain 8. Her current exercise regime is walking and performing stretching exercises.  Amber Stephenson  equivalent is 60.00 MME. She is also prescribed Lorazepam   by Dr. Levie Stephenson .We have discussed the black box warning of using opioids and benzodiazepines. I highlighted the dangers of using these drugs together and discussed the adverse events including respiratory suppression, overdose, cognitive impairment and importance of compliance with current regimen. We will continue to monitor and adjust as indicated.  she is being closely monitored and under the care of her psychiatrist.  Oral Swab was Performed today.   Amber Stephenson reports she was recently diagnosed with  Diverticulosis: PCP following. Continue to Monitor.   She reports 6 weeks ago she was walking in her home and had a fall, landed on her back, she was educated on falls prevention. She vrbalizes understanding.     Pain Inventory Average Pain 7 Pain Right Now 8 My pain is intermittent, sharp, burning, dull, stabbing, tingling, aching, and numbness  In the last 24 hours, has pain interfered with the following? General activity 7 Relation with others 7 Enjoyment of life 4 What TIME of day is your pain at its worst? evening and varies Sleep (in general) Poor  Pain is worse with: walking, bending, sitting, and standing Pain improves with: rest, heat/ice, medication, and TENS Relief from Meds: 8  Family History  Problem Relation Age of Onset   Diabetes Mother    Heart disease Mother    Alzheimer's disease Father    Heart disease Sister        CABG   Diabetes Sister    Alcohol  abuse Sister    Stroke  Brother    Heart disease Brother    Mental illness Brother    Diabetes Brother    Breast cancer Neg Hx    Ovarian cancer Neg Hx    Colon cancer Neg Hx    Endometrial cancer Neg Hx    Social History   Socioeconomic History   Marital status: Married    Spouse name: Amber Stephenson   Number of children: 3   Years of education: 15   Highest education level: Associate degree: academic program  Occupational History   Occupation: Retired    Comment: Chief Amber Officer  Tobacco Use   Smoking status: Never   Smokeless tobacco: Never  Vaping Use   Vaping status: Never Used  Substance and Sexual Activity   Alcohol  use: Not Currently    Alcohol /week: 0.0 standard drinks of alcohol    Drug use: No   Sexual activity: Yes    Partners: Male    Birth control/protection: Post-menopausal  Other Topics Concern   Not on file  Social History Narrative   Lives at home with husband.    They have 3 children who live away - [place] , [place] , and [place] .   She is from [place]  and most of her family lives there.   Her husbands's family live nearby   Right handed.   Social Drivers of Corporate investment banker Strain: Low Risk  (08/13/2023)   Overall Amber Resource Strain (CARDIA)    Difficulty of Paying Living Expenses: Not hard  at all  Food Insecurity: No Food Insecurity (10/12/2023)   Hunger Vital Sign    Worried About Running Out of Food in the Last Year: Never true    Ran Out of Food in the Last Year: Never true  Transportation Needs: No Transportation Needs (10/12/2023)   PRAPARE - Administrator, Civil Service (Medical): No    Lack of Transportation (Non-Medical): No  Physical Activity: Insufficiently Active (08/13/2023)   Exercise Vital Sign    Days of Exercise per Week: 3 days    Minutes of Exercise per Session: 20 min  Stress: No Stress Concern Present (08/13/2023)   Amber Stephenson of Occupational Health - Occupational Stress Questionnaire    Feeling of Stress : Only a  little  Social Connections: Moderately Integrated (10/07/2023)   Social Connection and Isolation Panel [NHANES]    Frequency of Communication with Friends and Family: More than three times a week    Frequency of Social Gatherings with Friends and Family: Once a week    Attends Religious Services: More than 4 times per year    Active Member of [employer] [place] [employer] or Organizations: No    Attends Engineer, structural: Never    Marital Status: Married   Past Surgical History:  Procedure Laterality Date   BREAST REDUCTION SURGERY     COLONOSCOPY  2018   6 mm cecal tubular adenoma, sigmoid diverticulosis, random colon biopsies consistent with microscopic colitis   EYE SURGERY Right    cateracts   HAMMER TOE SURGERY     LEFT HEART CATH AND CORONARY ANGIOGRAPHY N/A 02/03/2018   Procedure: LEFT HEART CATH AND CORONARY ANGIOGRAPHY;  Surgeon: [name], Peter M, MD;  Location: [facility];  Service: Cardiovascular;  Laterality: N/A;   REVERSE SHOULDER ARTHROPLASTY Right 08/19/2019   Procedure: REVERSE SHOULDER ARTHROPLASTY;  Surgeon: Winston Hawking, MD;  Location: [facility];  Service: Orthopedics;  Laterality: Right;  interscalene block   SHOULDER SURGERY Right    "I BROKE MY SHOUDLER   THIGH SURGERY     "TO REMOVE A TUMOR "   Past Surgical History:  Procedure Laterality Date   BREAST REDUCTION SURGERY     COLONOSCOPY  2018   6 mm cecal tubular adenoma, sigmoid diverticulosis, random colon biopsies consistent with microscopic colitis   EYE SURGERY Right    cateracts   HAMMER TOE SURGERY     LEFT HEART CATH AND CORONARY ANGIOGRAPHY N/A 02/03/2018   Procedure: LEFT HEART CATH AND CORONARY ANGIOGRAPHY;  Surgeon: [name], Peter M, MD;  Location: [facility];  Service: Cardiovascular;  Laterality: N/A;   REVERSE SHOULDER ARTHROPLASTY Right 08/19/2019   Procedure: REVERSE SHOULDER ARTHROPLASTY;  Surgeon: Winston Hawking, MD;  Location: [facility];  Service: Orthopedics;  Laterality: Right;   interscalene block   SHOULDER SURGERY Right    "I BROKE MY SHOUDLER   THIGH SURGERY     "TO REMOVE A TUMOR "   Past Medical History:  Diagnosis Date   Allergic rhinitis 02/24/2019   Ambulatory dysfunction 04/23/2020   Atrial fibrillation with RVR (HCC) 05/19/2017   Back pain/sacroiliitis--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   Benign paroxysmal positional vertigo due to bilateral vestibular disorder 05/20/2019   Bipolar 1 disorder (HCC) 01/23/2015   with GAD, benzo dependence   CAD (coronary artery disease)    Nonobstructive; Managed by Dr. [name]   Cardiomegaly 01/12/2018  CHF (congestive heart failure) 06/08/2022   Chronic back pain 01/04/2015   Chronic constipation 04/25/2020   Chronic diastolic heart failure (HCC) 05/30/2015   Chronic pain syndrome 08/22/2019   back pain, sacroiliitis   Chronic post-traumatic stress disorder (PTSD) 12/06/2020   Diabetic neuropathy (HCC) 02/06/2016   Dyslipidemia 09/24/2020   Essential hypertension    Functional diarrhea 10/26/2020   Herpes genitalis in women 07/16/2015   History of adenomatous polyp of colon 05/21/2019   Overview:   03/31/17: Colonoscopy: nonadvanced adenoma, microscopic colitis, f/u 5 yrs, [facility]/GAP   History of radiation therapy    Endometrial- 02/18/23-03/31/23-Dr. Royston Cornea Kinard   Hypotension 09/01/2022   Insomnia 01/23/2015   Metabolic acidosis 01/12/2023   Migraine headache with aura 02/12/2016   Myofascial pain dysfunction syndrome 08/22/2019   Non-alcoholic fatty liver disease 01/12/2018   OSA (obstructive sleep apnea) 02/24/2019   10/09/2018 - HST  - AHI 40.6    Osteopenia 12/06/2020   Rx alendronate  35 mg.   Pinguecula 12/06/2020   Presbyopia 12/06/2020   Pulmonary hypertension    RLS (restless legs syndrome) 04/27/2015   RSV (respiratory syncytial virus infection) 06/10/2023   Tremor, essential  12/11/2021   Type II diabetes mellitus (HCC) 09/24/2020   BP 119/73 (BP Location: Left Arm, Patient Position: Sitting)   Pulse 91   Ht 5\' 2"  (1.575 m)   Wt 180 lb 12.8 oz (82 kg)   SpO2 97%   BMI 33.07 kg/m   Opioid Risk Score:   Fall Risk Score:  `1  Depression screen [facility] 2/9     10/29/2023    1:26 PM 10/12/2023   11:38 AM 08/31/2023    1:07 PM 08/31/2023   12:53 PM 08/25/2023   12:07 PM 08/13/2023    2:41 PM 07/10/2023    2:57 PM  Depression screen PHQ 2/9  Decreased Interest 0 0 2 0 0 0 0  Down, Depressed, Hopeless 0 0 2 0 0 0 0  PHQ - 2 Score 0 0 4 0 0 0 0     Review of Systems  All other systems reviewed and are negative.      Objective:   Physical Exam Vitals and nursing note reviewed.  Constitutional:      Appearance: Normal appearance.  Cardiovascular:     Rate and Rhythm: Normal rate and regular rhythm.     Pulses: Normal pulses.     Heart sounds: Normal heart sounds.  Pulmonary:     Effort: Pulmonary effort is normal.     Breath sounds: Normal breath sounds.  Musculoskeletal:     Comments: Normal Muscle Bulk and Muscle Testing Reveals:  Upper Extremities: Decreased ROM  45 Degrees and Muscle Strength 5/5 Bilateral AC Joint Tenderness  Lumbar Hypersensitivity Lower Extremities: Full ROM and Muscle Strength 5/5  Transfer to wheelchair  Skin:    General: Skin is warm and dry.  Neurological:     Mental Status: She is alert and oriented to person, place, and time.  Psychiatric:        Mood and Affect: Mood normal.        Behavior: Behavior normal.         Assessment & Plan:  Chronic Bilateral Low Back Pain without Sciatica: Continue Current Medication regimen. Continue to Monitor. Continue HEP as Tolerated. Continue to Monitor. 10/29/2023 Fall subsequent encounter : Educated on [employer]. She verbalizes understanding. Continue to Monitor. 10/29/2023 Chronic Pain Syndrome: Refilled: Oxycontin   20 mg every 12 hours #60. Second script sent  for  the following month.We will continue the opioid monitoring program, this consists of regular clinic visits, examinations, urine drug screen, pill counts as well as use of [place]  Controlled Substance Reporting system. A 12 month History has been reviewed on the [place]  Controlled Substance Reporting System on 10/29/2023 4. Chronic Bilateral Shoulder Pain: Continue HEP as Tolerated. Continue to monitor. 10/29/2023 F/U in 2 months with Dr. Lovorn

## 2023-10-29 NOTE — Patient Instructions (Signed)
 Visit Information  Thank you for taking time to visit with me today. Please don't hesitate to contact me if I can be of assistance to you before our next scheduled telephone appointment.  Our next appointment is by telephone on 11/06/23 at 1015 am  Following is a copy of your care plan:   Goals Addressed             This Visit's Progress    [facility] Transitions of Care (TOC) Care Plan       Problems:  Recent Hospitalization for treatment of GI bleed Patient reports she has no GI bleeding since hospital discharge but she is having "explosive diarrhea", she made an appointment to see primary care provider today, pt weighs daily, checks CBG daily, checks 02 saturation Patient reports CBG fasting is 130, weight 172 pounds, continues to have diarrhea, now has nausea, zofran  is helping, pt states she finishes antibiotic today and hopes diarrhea will subside, will follow up with primary care provider tomorrow 10/21/23, pt states she has been to ED and urgent care since last conversation regarding diarrhea, pt states " they gave me the medicine for nausea", pt states she has " some bleeding few days ago"    Denies bleeding today. Per ED/ Urgent Care- EMR states pt had no dehydration, had labs, exam and imaging with no acute findings, pt instructed to drink plain water  and electrolyte solution.  Pt unable to provide stool sample for testing, will need to be done outpatient. 10/29/23- update- pt saw primary care provider 5/21, GI on 5/23- pt states infectious diarrhea and C-difficile ruled out, back on lomotil  as of 5/28, pt states diarrhea is better but not completely resolved, continues having rectal bleeding, GI doctor in process of ordering CT scan and colonoscopy, pt states " they think rectal bleeding is related to diverticulitis"  pt started eating minimal soft food diet yesterday and tolerating well.  Pt continues weighing and checking CBG consistently.  Goal:  Over the next 30 days, the patient will not  experience hospital readmission  Interventions:   Evaluation of current treatment plan related to GI bleed, diarrhea self-management and patient's adherence to plan as established by provider. Discussed plans with patient for ongoing care management follow up and provided patient with direct contact information for care management team Reviewed after visit summaries from recent GI and primary care provider appointment with pt Reviewed symptoms of dehydration and importance of staying well hydrated with episodes of diarrhea Reinforced signs/ symptoms GI bleeding, reportable signs/ symptoms  Patient Self Care Activities:  Attend all scheduled provider appointments Call pharmacy for medication refills 3-7 days in advance of running out of medications Call provider office for new concerns or questions  Notify RN Care Manager of TOC call rescheduling needs Participate in Transition of Care Program/Attend TOC scheduled calls Perform all self care activities independently  Take medications as prescribed   call office if I gain more than 2 pounds in one day or 5 pounds in one week track weight in diary watch for swelling in feet, ankles and legs every day weigh myself daily develop a rescue plan follow rescue plan if symptoms flare-up Let your doctor know if you have any symptoms of worsening GI bleeding, diarrhea Continue weighing daily, checking blood sugar and keeping a log Follow up with pain clinic on 5/29 @ 1 pm Stay well hydrated- continue to drink water  and electrolyte solution  Plan:  Telephone follow up appointment with care management team member scheduled for:  11/06/23 @ 1015 am The patient has been provided with contact information for the care management team and has been advised to call with any health related questions or concerns.         Patient verbalizes understanding of instructions and care plan provided today and agrees to view in MyChart. Active MyChart status and  patient understanding of how to access instructions and care plan via MyChart confirmed with patient.     Telephone follow up appointment with care management team member scheduled for: 11/06/23 @ 1015 am  Please call the care guide team at [phone] if you need to cancel or reschedule your appointment.   Please call the Suicide and Crisis Lifeline: 988 call the [place]  National Suicide Prevention Lifeline: (614) 055-6184 or TTY: (705)267-6842 TTY (574)191-7507) to talk to a trained counselor call 1-800-273-TALK (toll free, 24 hour hotline) go to [facility] [address] [place] [phone]) call the [facility] Crisis Line: 641-360-8072 call 911 if you are experiencing a Mental Health or Behavioral Health Crisis or need someone to talk to.  Cecilie Coffee [facility], BSN RN Care Manager/ Transition of Care [place]/ [facility] [phone]

## 2023-10-29 NOTE — Patient Outreach (Signed)
 Transition of Care week 3  Visit Note  10/29/2023  Name: Amber Stephenson MRN: [id number]          DOB: 05-Jun-1945  Situation: Patient enrolled in [facility] 30-day program. Visit completed with patient by telephone.   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   Benign paroxysmal positional vertigo due to bilateral vestibular disorder 05/20/2019   Bipolar 1 disorder (HCC) 01/23/2015   with GAD, benzo dependence   CAD (coronary artery disease)    Nonobstructive; Managed by Dr. [name]   Cardiomegaly 01/12/2018   CHF (congestive heart failure) 06/08/2022   Chronic back pain 01/04/2015   Chronic constipation 04/25/2020   Chronic diastolic heart failure (HCC) 05/30/2015   Chronic pain syndrome 08/22/2019   back pain, sacroiliitis   Chronic post-traumatic stress disorder (PTSD) 12/06/2020   Diabetic neuropathy (HCC) 02/06/2016   Dyslipidemia 09/24/2020   Essential hypertension    Functional diarrhea 10/26/2020   Herpes genitalis in women 07/16/2015   History of adenomatous polyp of colon 05/21/2019   Overview:   03/31/17: Colonoscopy: nonadvanced adenoma, microscopic colitis, f/u 5 yrs, [facility]/GAP   History of radiation therapy    Endometrial- 02/18/23-03/31/23-Dr. Royston Cornea Kinard   Hypotension 09/01/2022   Insomnia 01/23/2015   Metabolic acidosis 01/12/2023   Migraine headache with aura 02/12/2016   Myofascial pain dysfunction syndrome 08/22/2019   Non-alcoholic fatty liver disease 01/12/2018   OSA (obstructive sleep apnea) 02/24/2019   10/09/2018 - HST  - AHI 40.6    Osteopenia 12/06/2020   Rx alendronate  35 mg.   Pinguecula 12/06/2020   Presbyopia 12/06/2020   Pulmonary hypertension    RLS  (restless legs syndrome) 04/27/2015   RSV (respiratory syncytial virus infection) 06/10/2023   Tremor, essential 12/11/2021   Type II diabetes mellitus (HCC) 09/24/2020    Assessment: Patient Reported Symptoms: Cognitive Cognitive Status: Able to follow simple commands, Alert and oriented to person, place, and time, Normal speech and language skills      Neurological Neurological Review of Symptoms: No symptoms reported    HEENT HEENT Symptoms Reported: No symptoms reported      Cardiovascular Cardiovascular Symptoms Reported: No symptoms reported Cardiovascular Conditions: Hypertension Cardiovascular Management Strategies: Adequate rest Weight: 177 lb (80.3 kg) Cardiovascular Self-Management Outcome: 3 (uncertain) Cardiovascular Comment: pt weighs daily, checks 02 saturation prn  Respiratory Respiratory Symptoms Reported: Shortness of breath Other Respiratory Symptoms: chronic dyspnea with exertion Respiratory Self-Management Outcome: 3 (uncertain)  Endocrine Patient reports the following symptoms related to hypoglycemia or hyperglycemia : No symptoms reported Is patient diabetic?: Yes Is patient checking blood sugars at home?: Yes Endocrine Conditions: Diabetes Endocrine Management Strategies: Adequate rest, Routine screening, Medication therapy Endocrine Self-Management Outcome: 3 (uncertain)  Gastrointestinal Gastrointestinal Symptoms Reported: Diarrhea, Other Other Gastrointestinal Symptoms: rectal bleeding (varies from red to black) MD aware, pt saw GI on 5/23 for assessment, CT scan and colonoscopy planned, pt states C-Diff and infectious diarrhea has been ruled out, pt is back on lomotil  as of 5/28, pt states diarrhea "is much improved but not totally gone" Gastrointestinal Conditions: Diarrhea, Bleeding Gastrointestinal Management Strategies: Adequate rest, Medication therapy Gastrointestinal Self-Management Outcome: 3 (uncertain) Gastrointestinal Comment: pt states she  is drinking adequate fluids, under care of GI with ongoing tests  Genitourinary Genitourinary Symptoms Reported: No symptoms reported    Integumentary Integumentary Symptoms Reported: No symptoms reported    Musculoskeletal Musculoskelatal Symptoms Reviewed: No symptoms reported        Psychosocial Psychosocial Symptoms Reported: No symptoms reported         There were no vitals filed for this visit.  Medications Reviewed Today     Reviewed by Daralyn Earl, RN (Registered Nurse) on 10/29/23 at 1123  Med List Status: <None>   Medication Order Taking? Sig Documenting Provider Last Dose Status Informant  ANUCORT-HC  25 MG suppository [id number] No Place 25 mg rectally daily. [provider] Taking Active Self  apixaban  (ELIQUIS ) 5 MG TABS tablet [id number] No Take 1 tablet (5 mg total) by mouth 2 (two) times daily. Jacqueline Matsu, MD Taking Active Self  ARIPiprazole  (ABILIFY ) 2 MG tablet [id number] No Take 2 mg by mouth at bedtime. [provider] Taking Active Self  atorvastatin  (LIPITOR) 80 MG tablet [id number] No Take 1 tablet (80 mg total) by mouth daily.  Patient taking differently: Take 80 mg by mouth every evening.   [place], [place] Taking Expired 10/23/23 2359 Self  budesonide  (ENTOCORT EC ) 3 MG 24 hr capsule [id number] No Take 1 capsule (3 mg total) by mouth 2 (two) times daily. [name], MD Taking Active   ciprofloxacin  (CIPRO ) 500 MG tablet [id number]  Take 1 tablet (500 mg total) by mouth 2 (two) times daily for 10 days. Evander Hills, PA-C  Active   clotrimazole  (MYCELEX ) 10 MG troche [id number] No Take 10 mg by mouth in the morning and at bedtime. [provider] Taking Active Self  colchicine  0.6 MG tablet [id number] No Take 1 tablet (0.6 mg total) by mouth 2 (two) times daily. For mouth sores [name], MD Taking Active   Continuous Glucose Sensor (DEXCOM G7 SENSOR) MISC [id number] No CHANGE SENSOR EVERY 10 DAYS Shamleffer,  Julian Obey, MD Taking Active Self  cyanocobalamin  (VITAMIN B12) 1000 MCG tablet [id number] No Take 1 tablet (1,000 mcg total) by mouth daily. Burton Casey, MD Taking Active Self  denosumab  (PROLIA ) 60 MG/ML SOSY injection [id number] No Inject 60 mg into the skin every 6 (six) months. [name], MD Taking Active Self           Med Note Author Board, Curley Double Oct 07, 2023  8:01 PM) January**  diphenoxylate -atropine  (LOMOTIL ) 2.5-0.025 MG tablet [id number] No Take 2 tablets by mouth 4 (four) times daily as needed for diarrhea or loose stools. [name], MD Taking Active   escitalopram  (LEXAPRO ) 10 MG tablet [id number] No Take 10 mg by mouth daily. [provider] Taking Active Self           Med Note (WARD, ANGELICA G   Wed Oct 07, 2023  7:56 PM) Pt needs refill  fluticasone -salmeterol (ADVAIR DISKUS) 500-50 MCG/ACT AEPB [id number] No Inhale 1 puff into the lungs in the morning and at bedtime. [name], MD Taking Active Self           Med Note (WARD, Curley Double Oct 07, 2023  8:09 PM) Pt has not started this medication yet   furosemide  (LASIX ) 40 MG tablet [id number] No Take 40 mg by mouth daily. [provider] Taking Active Self  hydrOXYzine  (VISTARIL ) 25 MG capsule [id number] No Take 1 capsule (25 mg total) by mouth 3 (three) times daily. [name], MD Taking Active Self  insulin  glargine (LANTUS  SOLOSTAR) 100 UNIT/ML Solostar  Pen [id number] No Inject 30 Units into the skin 2 (two) times daily. Shamleffer, Julian Obey, MD Taking Active Self  insulin  lispro (HUMALOG  KWIKPEN) 100 UNIT/ML KwikPen [id number] No Max daily 45 units  Patient taking differently: Inject 6-45 Units into the skin 3 (three) times daily. Max daily 45 units   Shamleffer, Ibtehal Jaralla, MD Taking Active Self           Med Note (WARD, Aneta Keepers   Wed Oct 07, 2023  7:57 PM) 12 units*  Insulin  Pen Needle 29G X MISC [id number] No 1 Device by Does not apply route daily in the  afternoon. Shamleffer, Julian Obey, MD Taking Active Self  LORazepam  (ATIVAN ) 0.5 MG tablet [id number] No Take 0.5-1 mg by mouth in the morning, at noon, and at bedtime. Take 0.5 mg by mouth in the morning, 0.5 mg at lunch, and 0.5 mg at bedtime [provider] Taking Active Self           Med Note (WARD, ANGELICA G   Wed Oct 07, 2023  7:56 PM) Pt needs refill  losartan  (COZAAR ) 25 MG tablet [id number] No Take 25 mg by mouth daily. [provider] Taking Active Self  MAGNESIUM  PO [id number] No Take 1 tablet by mouth daily. [provider] Taking Active Self  meclizine  (ANTIVERT ) 25 MG tablet [id number] No Take 1 tablet (25 mg total) by mouth 3 (three) times daily as needed for dizziness. Dettinger, Lucio Sabin, MD Taking Active Self  metFORMIN  (GLUCOPHAGE -XR) 500 MG 24 hr tablet [id number] No Take 2 tablets (1,000 mg total) by mouth daily with breakfast. Shamleffer, Ibtehal Jaralla, MD Taking Active Self  Methocarbamol  1000 MG TABS [id number] No Take 1,000 mg by mouth every 6 (six) hours as needed.  Patient taking differently: Take 1,000 mg by mouth every 6 (six) hours as needed (muscle spasms).   Lovorn, Megan, MD Taking Active Self  metoprolol  tartrate (LOPRESSOR ) 50 MG tablet [id number] No Take 1 tablet (50 mg total) by mouth 2 (two) times daily. Jacqueline Matsu, MD Taking Active Self  metroNIDAZOLE  (FLAGYL ) 500 MG tablet [id number]  Take 1 tablet (500 mg total) by mouth 3 (three) times daily for 10 days. Evander Hills, PA-C  Active   naloxone  (NARCAN ) nasal spray 4 mg/0.1 mL [id number] No Place 1 spray into the nose daily as needed (for over dose). [provider] Taking Active Self           Med Note (CRUTHIS, CHLOE C   Wed Aug 19, 2023  1:55 PM)    nystatin  (MYCOSTATIN /NYSTOP ) powder [id number] No Apply 1 Application topically as needed (rash). Shamleffer, Julian Obey, MD Taking Active Self           Med Note (CRUTHIS, CHLOE C   Wed Aug 19, 2023  1:55 PM)     nystatin  ointment (MYCOSTATIN ) [id number] No Apply 1 Application topically 2 (two) times daily as needed (for rashes). Javan Messing, NP Taking Active Self           Med Note (CRUTHIS, CHLOE C   Wed Aug 19, 2023  1:55 PM)    omeprazole (PRILOSEC) 40 MG capsule [id number]  Take 1 capsule (40 mg total) by mouth 2 (two) times daily before a meal. April Knack, Kristen S, PA-C  Active   ondansetron  (ZOFRAN ) 4 MG tablet [id number]  Take 1 tablet (4 mg total) by mouth every 8 (eight) hours as needed for nausea or vomiting. For cancer related nausea Evander Hills, PA-C  Active   OXcarbazepine  (TRILEPTAL ) 150 MG tablet [id number] No Take 1 tablet (150 mg total) by mouth 2 (two) times daily. Aura Leeds Epworth, DO Taking Active Self  Oxycodone  HCl 20 MG TABS [id number] No Take 1 tablet (20 mg total) by mouth 2 (two) times daily as needed. Do Not Fill Before 10/05/23  Patient taking differently: Take 1 tablet by mouth in the morning and at bedtime. Do Not Fill Before 10/05/23   Lovorn, Megan, MD Taking Active Self  potassium chloride  (KLOR-CON ) 10 MEQ tablet [id number] No Take 1 tablet (10 mEq total) by mouth daily. Von [place], [place] Taking Expired 10/23/23 2359 Self  pregabalin  (LYRICA ) 200 MG capsule [id number] No Take 200 mg by mouth 2 (two) times daily.  Patient not taking: Reported on 10/23/2023   [provider] Not Taking Active Self           Med Note (WARD, Aneta Keepers   Wed Oct 07, 2023  7:56 PM) Pt needs refill  rOPINIRole  (REQUIP ) 1 MG tablet [id number] No Take 1 tablet (1 mg total) by mouth at bedtime. For leg cramps [name], MD Taking Active Self  tirzepatide  (MOUNJARO ) 12.5 MG/0.5ML Pen [id number] No Inject 12.5 mg into the skin once a week. Shamleffer, Julian Obey, MD Taking Active Self  Vitamin D , Cholecalciferol , 25 MCG (1000 UT) TABS [id number] No Take 1,000 Units by mouth daily in the afternoon. [provider] Taking Active Self  Med List Note Levi Real,  CPhT 08/29/22 1018): [employer] insurance             Recommendation:   PCP Follow-up Specialty provider follow-up GI  Follow Up Plan:   Telephone follow-up 11/06/23 @ 1015 am  Cecilie Coffee [facility], BSN RN Care Manager/ Transition of Care [place]/ [facility] [phone]

## 2023-11-01 ENCOUNTER — Other Ambulatory Visit: Payer: Self-pay | Admitting: Cardiology

## 2023-11-01 DIAGNOSIS — I4821 Permanent atrial fibrillation: Secondary | ICD-10-CM

## 2023-11-02 ENCOUNTER — Ambulatory Visit: Admitting: Family Medicine

## 2023-11-02 ENCOUNTER — Encounter: Payer: Self-pay | Admitting: Family Medicine

## 2023-11-02 VITALS — BP 135/99 | HR 106 | Temp 98.3°F | Ht 62.0 in | Wt 177.0 lb

## 2023-11-02 DIAGNOSIS — R109 Unspecified abdominal pain: Secondary | ICD-10-CM

## 2023-11-02 DIAGNOSIS — R197 Diarrhea, unspecified: Secondary | ICD-10-CM | POA: Diagnosis not present

## 2023-11-02 MED ORDER — DIPHENOXYLATE-ATROPINE 2.5-0.025 MG PO TABS: 2.0000 | ORAL_TABLET | Freq: Four times a day (QID) | ORAL | 0 refills | Status: DC | PRN

## 2023-11-02 MED ORDER — CARBIDOPA-LEVODOPA 10-100 MG PO TABS: 1.0000 | ORAL_TABLET | Freq: Three times a day (TID) | ORAL | 5 refills | Status: DC

## 2023-11-02 NOTE — Progress Notes (Unsigned)
 Subjective:  Patient ID: Amber Stephenson, female    DOB: Dec 02, 1945  Age: 78 y.o. MRN: [id number]  CC: Rectal Bleeding (Ongoing rectal bleeding, diarrhea, nausea, constant diziness mouth sores, stomach pain, and insomnia. Pt is still on liquid diet. All the symptoms seem to hit all at once. )   HPI Amber Stephenson presents for follow-up for repeated episodes of rectal bleeding.  None noted for the last 24 hours.  She is concerned about multiple loose bowel movements a day.  She has seen GI who believes apparently it is infection as they have started her on antibiotics.  They ridiculed my prescription for budesonide  and had the patient quit using it.  They did not explain to her what the problem was.  Additionally they thought they were supposed to stop the colchicine  at the same time.  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  Since she has not given that a trial she also continues to have mouth ulcers.  These have not responded to multiple medical treatments particularly with antifungals.  I requested that she resume the colchicine .  Since the GI specialist have taken over her care for her diarrhea I asked her to return to them for any further treatment recommendations.  It did not seem appropriate for us  to be in all position about treatment options.  Since she works with the specialist I will defer to her.  There was also some discussion about her C. difficile test.  That has not come back yet.  It apparently was reordered by GI.  Patient states that she told GI that I told her that she had a positive C. difficile test.  I explained to her that the C. difficile test had not been done.  Instead through apparent lab error, and I fob was done instead.  The I fob was negative.  This is what I told the patient explaining that this was for blood in the stool not for infection of any type much less C. difficile.     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), Benign paroxysmal positional vertigo due to bilateral vestibular disorder (05/20/2019), Bipolar 1 disorder (HCC) (01/23/2015), CAD (coronary artery disease), Cardiomegaly (01/12/2018), CHF (congestive heart failure) (06/08/2022), Chronic back pain (01/04/2015), Chronic constipation (04/25/2020), Chronic diastolic heart failure (HCC) (11/91/4782), Chronic pain syndrome (08/22/2019), Chronic post-traumatic stress disorder (PTSD) (12/06/2020), Diabetic neuropathy (HCC) (02/06/2016), Dyslipidemia (09/24/2020), Essential hypertension, Functional diarrhea (10/26/2020), Herpes genitalis in women (07/16/2015), History of adenomatous polyp of colon (05/21/2019), History of radiation therapy, Hypotension (09/01/2022), Insomnia (01/23/2015), Metabolic acidosis (01/12/2023), Migraine headache with aura (02/12/2016), Myofascial pain dysfunction syndrome (08/22/2019), Non-alcoholic fatty liver disease (95/62/1308), OSA (obstructive sleep apnea) (02/24/2019), Osteopenia (12/06/2020), Pinguecula (12/06/2020), Presbyopia (12/06/2020), Pulmonary hypertension, RLS (restless legs syndrome) (04/27/2015), RSV (respiratory syncytial virus infection) (06/10/2023), Tremor, essential (12/11/2021), and Type II diabetes mellitus (HCC) (09/24/2020).   She has a past surgical history that includes THIGH SURGERY; Shoulder surgery (Right); Breast reduction surgery; Eye surgery (Right); Hammer toe  surgery; LEFT HEART CATH AND CORONARY ANGIOGRAPHY (N/A, 02/03/2018); Reverse shoulder arthroplasty (Right,  08/19/2019); and Colonoscopy (2018).   Her family history includes Alcohol  abuse in her sister; Alzheimer's disease in her father; Diabetes in her brother, mother, and sister; Heart disease in her brother, mother, and sister; Mental illness in her brother; Stroke in her brother.She reports that she has never smoked. She has never used smokeless tobacco. She reports that she does not currently use alcohol . She reports that she does not use drugs.    ROS Review of Systems  Constitutional: Negative.   HENT:  Negative for congestion.   Eyes:  Negative for visual disturbance.  Respiratory:  Negative for shortness of breath.   Cardiovascular:  Negative for chest pain.  Gastrointestinal:  Positive for abdominal pain, anal bleeding, blood in stool, diarrhea, nausea and rectal pain. Negative for constipation.  Genitourinary:  Negative for difficulty urinating.  Musculoskeletal:  Positive for arthralgias and myalgias.  Neurological:  Negative for headaches.  Psychiatric/Behavioral:  Negative for sleep disturbance.     Objective:  BP (!) 135/99   Pulse (!) 106   Temp 98.3 F (36.8 C)   Ht 5\' 2"  (1.575 m)   Wt 177 lb (80.3 kg)   SpO2 97%   BMI 32.37 kg/m   BP Readings from Last 3 Encounters:  11/02/23 (!) 135/99  10/29/23 119/73  10/23/23 134/81    Wt Readings from Last 3 Encounters:  11/02/23 177 lb (80.3 kg)  10/29/23 180 lb 12.8 oz (82 kg)  10/29/23 177 lb (80.3 kg)     Physical Exam Constitutional:      General: She is not in acute distress.    Appearance: She is well-developed. She is ill-appearing.  Cardiovascular:     Rate and Rhythm: Normal rate and regular rhythm.  Pulmonary:     Breath sounds: Normal breath sounds.  Abdominal:     General: There is distension.     Palpations: There is no mass.     Tenderness: There is abdominal tenderness. There is no guarding or rebound.  Musculoskeletal:        General: Normal range of motion.  Skin:    General: Skin is warm  and dry.  Neurological:     Mental Status: She is alert and oriented to person, place, and time.      Assessment & Plan:  Abdominal pain, unspecified abdominal location  Diarrhea, unspecified type -     Diphenoxylate -Atropine ; Take 2 tablets by mouth 4 (four) times daily as needed for diarrhea or loose stools.  Dispense: 30 tablet; Refill: 0  Other orders -     Carbidopa-Levodopa; Take 1 tablet by mouth 3 (three) times daily. For parkinsonism  Dispense: 90 tablet; Refill: 5   Carbidopa was actually prescribed for her restless legs rather than for true parkinsonism.  That will be changed in her medication list.  She requested refill of ropinirole .  I declined that simply because of my concern for multiple side effects.  Due to active participation in her care by GI I will defer any treatment referable to GI symptoms to GI service.  Follow-up: Return in about 3 months (around 02/02/2024).  [name], M.D.

## 2023-11-02 NOTE — Telephone Encounter (Signed)
 Prescription refill request for Eliquis  received. Indication:afib Last office visit:3/25 Scr:0.66  5/25 Age: 78 Weight:82  kg  Prescription refilled

## 2023-11-03 ENCOUNTER — Telehealth: Payer: Self-pay | Admitting: *Deleted

## 2023-11-03 LAB — DRUG TOX ALC METAB W/CON, ORAL FLD: Alcohol Metabolite: NEGATIVE ng/mL (ref <25)

## 2023-11-03 LAB — DRUG TOX MONITOR 1 W/CONF, ORAL FLD
Amphetamines: NEGATIVE ng/mL (ref <10)
Barbiturates: NEGATIVE ng/mL (ref <10)
Benzodiazepines: NEGATIVE ng/mL (ref <0.50)
Buprenorphine: NEGATIVE ng/mL (ref <0.10)
Cocaine: NEGATIVE ng/mL (ref <5.0)
Codeine: NEGATIVE ng/mL (ref <2.5)
Dihydrocodeine: NEGATIVE ng/mL (ref <2.5)
Fentanyl: NEGATIVE ng/mL (ref <0.10)
Heroin Metabolite: NEGATIVE ng/mL (ref <1.0)
Hydrocodone: NEGATIVE ng/mL (ref <2.5)
Hydromorphone: NEGATIVE ng/mL (ref <2.5)
MARIJUANA: NEGATIVE ng/mL (ref <2.5)
MDMA: NEGATIVE ng/mL (ref <10)
Meprobamate: NEGATIVE ng/mL (ref <2.5)
Methadone: NEGATIVE ng/mL (ref <5.0)
Morphine: NEGATIVE ng/mL (ref <2.5)
Nicotine Metabolite: NEGATIVE ng/mL (ref <5.0)
Norhydrocodone: NEGATIVE ng/mL (ref <2.5)
Noroxycodone: 26.5 ng/mL — ABNORMAL HIGH (ref <2.5)
Opiates: POSITIVE ng/mL — AB (ref <2.5)
Oxycodone: 198.8 ng/mL — ABNORMAL HIGH (ref <2.5)
Oxymorphone: NEGATIVE ng/mL (ref <2.5)
Phencyclidine: NEGATIVE ng/mL (ref <10)
Tapentadol: NEGATIVE ng/mL (ref <5.0)
Tramadol: NEGATIVE ng/mL (ref <5.0)
Zolpidem: NEGATIVE ng/mL (ref <5.0)

## 2023-11-03 NOTE — Telephone Encounter (Signed)
 Pt called and stated that, she went to her PCP yesterday and found out that a C-diff test was not done. So she has to do the test. She states she is not having productive bowel movements. Informed to try some MiraLax  and drink plenty of fluids. She stated that she is still on a liquid diet and was not sure how long she was suppose to be on it. Please advise

## 2023-11-03 NOTE — Telephone Encounter (Signed)
Spoke to pt, informed her of recommendations. She voiced understanding.  

## 2023-11-03 NOTE — Telephone Encounter (Signed)
 If she is no longer having diarrhea, she doesn't need to complete stool studies.   If she is having constipation, I agree with starting MiraLAX  17g daily in 8 oz of water .   She doesn't need to be on a liquid diet. She can advance back to a normal diet. The liquid diet was only while she was having pain related to diverticulitis.   We need to arrange follow-up in the next couple of weeks.

## 2023-11-05 ENCOUNTER — Telehealth: Payer: Self-pay | Admitting: *Deleted

## 2023-11-05 DIAGNOSIS — J9611 Chronic respiratory failure with hypoxia: Secondary | ICD-10-CM

## 2023-11-05 NOTE — Telephone Encounter (Signed)
 Copied from CRM [id number]. Topic: Clinical - Medical Advice >> Nov 02, 2023  9:53 AM Margarette Shawl wrote: Reason for CRM:   Pt is reporting her oxygen  was taken away 1.5 weeks ago and she contacted clinic to schedule an appt. Soonest avail is end of 01/2024 with [facility]. Is concerned about being without oxygen  this long.   Requests call back from nurse  # [phone]  ATC x1.  Gentleman advised me she was not available and to please call back in 5 minutes  Called and spoke with staff at [facility], I was advised that insurance was denying the claim for oxygen  a nd denied the appeal as well.  I asked what they needed to get it covered and was told, an OV, MD notes and testing results (ONO, walk test).  Advised that the patient did not have an appointment until August, however, I would try to get her scheduled something sooner.  Nothing further needed.  Called back and spoke with patient.  She states she had been on the oxygen  for about a year and [employer] took it back 1.5 weeks ago.  She states she can tell that she has been without it.  She was wearing it at night and some during the day.  She is not wearing a CPAP.    Advised I had talked to [place] and that insurance had denied the claim for oxygen  and denied the appeal that was sent in as well.  I asked what was needed to get the insurance [facility]) to cover the oxygen .  They need an OV, visit notes and testing (walk test, ONO).  Advised I would talk to Dr. Gaynell Keeler and find out if he wants to get an ONO before her OV.  I scheduled her an appointment with Dr. Gaynell Keeler on 11/11/23 at 3 pm, advised to arrive by 2:45 pm for check in.  She verbalized understanding.  Nothing further needed.

## 2023-11-06 ENCOUNTER — Other Ambulatory Visit: Payer: Self-pay | Admitting: Family Medicine

## 2023-11-06 ENCOUNTER — Telehealth: Payer: Self-pay

## 2023-11-06 ENCOUNTER — Other Ambulatory Visit: Payer: Self-pay | Admitting: *Deleted

## 2023-11-06 ENCOUNTER — Encounter: Payer: Self-pay | Admitting: Family Medicine

## 2023-11-06 ENCOUNTER — Other Ambulatory Visit

## 2023-11-06 DIAGNOSIS — R197 Diarrhea, unspecified: Secondary | ICD-10-CM | POA: Diagnosis not present

## 2023-11-06 MED ORDER — CARBIDOPA-LEVODOPA 10-100 MG PO TABS: 1.0000 | ORAL_TABLET | Freq: Three times a day (TID) | ORAL | 5 refills | Status: DC

## 2023-11-06 NOTE — Transitions of Care (Post Inpatient/ED Visit) (Signed)
 Transition of Care week 4  Visit Note  11/06/2023  Name: Amber Stephenson MRN: [id number]          DOB: June 22, 1945  Situation: Patient enrolled in [facility] 30-day program. Visit completed with patient by telephone.   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   Benign paroxysmal positional vertigo due to bilateral vestibular disorder 05/20/2019   Bipolar 1 disorder (HCC) 01/23/2015   with GAD, benzo dependence   CAD (coronary artery disease)    Nonobstructive; Managed by Dr. [name]   Cardiomegaly 01/12/2018   CHF (congestive heart failure) 06/08/2022   Chronic back pain 01/04/2015   Chronic constipation 04/25/2020   Chronic diastolic heart failure (HCC) 05/30/2015   Chronic pain syndrome 08/22/2019   back pain, sacroiliitis   Chronic post-traumatic stress disorder (PTSD) 12/06/2020   Diabetic neuropathy (HCC) 02/06/2016   Dyslipidemia 09/24/2020   Essential hypertension    Functional diarrhea 10/26/2020   Herpes genitalis in women 07/16/2015   History of adenomatous polyp of colon 05/21/2019   Overview:   03/31/17: Colonoscopy: nonadvanced adenoma, microscopic colitis, f/u 5 yrs, [facility]/GAP   History of radiation therapy    Endometrial- 02/18/23-03/31/23-Dr. Royston Cornea Kinard   Hypotension 09/01/2022   Insomnia 01/23/2015   Metabolic acidosis 01/12/2023   Migraine headache with aura 02/12/2016   Myofascial pain dysfunction syndrome 08/22/2019   Non-alcoholic fatty liver disease 01/12/2018   OSA (obstructive sleep apnea) 02/24/2019   10/09/2018 - HST  - AHI 40.6    Osteopenia 12/06/2020   Rx alendronate  35 mg.   Pinguecula 12/06/2020   Presbyopia 12/06/2020   Pulmonary hypertension    RLS  (restless legs syndrome) 04/27/2015   RSV (respiratory syncytial virus infection) 06/10/2023   Tremor, essential 12/11/2021   Type II diabetes mellitus (HCC) 09/24/2020    Assessment: Patient Reported Symptoms: Cognitive Cognitive Status: Able to follow simple commands, Alert and oriented to person, place, and time, Normal speech and language skills      Neurological Neurological Review of Symptoms: No symptoms reported    HEENT HEENT Symptoms Reported: No symptoms reported      Cardiovascular Cardiovascular Symptoms Reported: No symptoms reported    Respiratory Respiratory Symptoms Reported: Shortness of breath Other Respiratory Symptoms: chronic dyspnea with exertion Respiratory Self-Management Outcome: 3 (uncertain)  Endocrine Patient reports the following symptoms related to hypoglycemia or hyperglycemia : No symptoms reported Is patient diabetic?: Yes Is patient checking blood sugars at home?: Yes Endocrine Conditions: Diabetes Endocrine Management Strategies: Adequate rest, Medication therapy, Routine screening Endocrine Self-Management Outcome: 3 (uncertain)  Gastrointestinal Gastrointestinal Symptoms Reported: Diarrhea Other Gastrointestinal Symptoms: pt states diarrhea "was better, but now having it again some days"  pt states continues to have bleeding at times, states "test they did with stool specimen they didn't do it right so I'm taking another one to doctor's office today" Gastrointestinal Conditions: Diarrhea Gastrointestinal Management Strategies: Adequate rest, Nutrition support, Medication therapy Gastrointestinal Comment: pt states she is drinking adequate fluids, under care of GI, taking stool specimen to primary care provider office today Nutrition Risk Screen (CP):  (diarrhea)  Genitourinary Genitourinary Symptoms Reported: No symptoms reported    Integumentary Integumentary Symptoms Reported: No symptoms reported    Musculoskeletal Musculoskelatal Symptoms  Reviewed: No  symptoms reported        Psychosocial Psychosocial Symptoms Reported: No symptoms reported         There were no vitals filed for this visit.  Medications Reviewed Today     Reviewed by Daralyn Earl, RN (Registered Nurse) on 11/06/23 at 1102  Med List Status: <None>   Medication Order Taking? Sig Documenting Provider Last Dose Status Informant  ANUCORT-HC  25 MG suppository [id number] No Place 25 mg rectally daily. [provider] Taking Active Self  ARIPiprazole  (ABILIFY ) 2 MG tablet [id number] No Take 2 mg by mouth at bedtime. [provider] Taking Active Self  atorvastatin  (LIPITOR) 80 MG tablet [id number] No Take 1 tablet (80 mg total) by mouth daily.  Patient taking differently: Take 80 mg by mouth every evening.   [place], [place] Taking Expired 10/23/23 2359 Self  BELSOMRA  20 MG TABS [id number] No Take 1 tablet by mouth at bedtime as needed. [provider] Taking Active   carbidopa-levodopa (SINEMET) 10-100 MG tablet [id number]  Take 1 tablet by mouth 3 (three) times daily. For parkinsonism [name], MD  Active   colchicine  0.6 MG tablet [id number] No Take 1 tablet (0.6 mg total) by mouth 2 (two) times daily. For mouth sores [name], MD Taking Active   Continuous Glucose Sensor (DEXCOM G7 SENSOR) MISC [id number] No CHANGE SENSOR EVERY 10 DAYS Shamleffer, Julian Obey, MD Taking Active Self  cyanocobalamin  (VITAMIN B12) 1000 MCG tablet [id number] No Take 1 tablet (1,000 mcg total) by mouth daily. Burton Casey, MD Taking Active Self  denosumab  (PROLIA ) 60 MG/ML SOSY injection [id number] No Inject 60 mg into the skin every 6 (six) months. [name], MD Taking Active Self           Med Note Author Board, Curley Double Oct 07, 2023  8:01 PM) January**  diphenoxylate -atropine  (LOMOTIL ) 2.5-0.025 MG tablet [id number]  Take 2 tablets by mouth 4 (four) times daily as needed for diarrhea or loose stools. [name], MD  Active    ELIQUIS  5 MG TABS tablet [id number] No TAKE 1 TABLET TWICE A DAY Turner, Traci R, MD Taking Active   escitalopram  (LEXAPRO ) 10 MG tablet [id number] No Take 10 mg by mouth daily. [provider] Taking Active Self           Med Note (WARD, ANGELICA G   Wed Oct 07, 2023  7:56 PM) Pt needs refill  fluticasone -salmeterol (ADVAIR DISKUS) 500-50 MCG/ACT AEPB [id number] No Inhale 1 puff into the lungs in the morning and at bedtime. [name], MD Taking Active Self           Med Note (WARD, Curley Double Oct 07, 2023  8:09 PM) Pt has not started this medication yet   furosemide  (LASIX ) 40 MG tablet [id number] No Take 40 mg by mouth daily. [provider] Taking Active Self  hydrOXYzine  (VISTARIL ) 25 MG capsule [id number] No Take 1 capsule (25 mg total) by mouth 3 (three) times daily. [name], MD Taking Active Self  insulin  glargine (LANTUS  SOLOSTAR) 100 UNIT/ML Solostar Pen [id number] No Inject 30 Units into the skin 2 (two) times daily. Shamleffer, Ibtehal Jaralla, MD Taking Active Self  insulin  lispro (HUMALOG  KWIKPEN) 100 UNIT/ML KwikPen [id number] No Max daily 45 units  Patient taking differently: Inject 6-45 Units into the skin 3 (three) times daily. Max daily 45 units   Shamleffer, Ibtehal Jaralla, MD Taking Active Self  Med Note (WARD, ANGELICA G   Wed Oct 07, 2023  7:57 PM) 12 units*  Insulin  Pen Needle 29G X MISC [id number] No 1 Device by Does not apply route daily in the afternoon. Shamleffer, Julian Obey, MD Taking Active Self  LORazepam  (ATIVAN ) 0.5 MG tablet [id number] No Take 0.5-1 mg by mouth in the morning, at noon, and at bedtime. Take 0.5 mg by mouth in the morning, 0.5 mg at lunch, and 0.5 mg at bedtime [provider] Taking Active Self           Med Note (WARD, ANGELICA G   Wed Oct 07, 2023  7:56 PM) Pt needs refill  losartan  (COZAAR ) 25 MG tablet [id number] No Take 25 mg by mouth daily. [provider] Taking Active Self   MAGNESIUM  PO [id number] No Take 1 tablet by mouth daily. [provider] Taking Active Self  meclizine  (ANTIVERT ) 25 MG tablet [id number] No Take 1 tablet (25 mg total) by mouth 3 (three) times daily as needed for dizziness. Dettinger, Lucio Sabin, MD Taking Active Self  metFORMIN  (GLUCOPHAGE -XR) 500 MG 24 hr tablet [id number] No Take 2 tablets (1,000 mg total) by mouth daily with breakfast. Shamleffer, Ibtehal Jaralla, MD Taking Active Self  Methocarbamol  1000 MG TABS [id number] No Take 1,000 mg by mouth every 6 (six) hours as needed.  Patient taking differently: Take 1,000 mg by mouth every 6 (six) hours as needed (muscle spasms).   Lovorn, Megan, MD Taking Active Self  metoprolol  tartrate (LOPRESSOR ) 50 MG tablet [id number] No Take 1 tablet (50 mg total) by mouth 2 (two) times daily. Jacqueline Matsu, MD Taking Active Self  naloxone  (NARCAN ) nasal spray 4 mg/0.1 mL [id number] No Place 1 spray into the nose daily as needed (for over dose). [provider] Taking Active Self           Med Note (CRUTHIS, CHLOE C   Wed Aug 19, 2023  1:55 PM)    nystatin  (MYCOSTATIN /NYSTOP ) powder [id number] No Apply 1 Application topically as needed (rash). Shamleffer, Julian Obey, MD Taking Active Self           Med Note (CRUTHIS, CHLOE C   Wed Aug 19, 2023  1:55 PM)    nystatin  ointment (MYCOSTATIN ) [id number] No Apply 1 Application topically 2 (two) times daily as needed (for rashes). Javan Messing, NP Taking Active Self           Med Note (CRUTHIS, CHLOE C   Wed Aug 19, 2023  1:55 PM)    omeprazole  (PRILOSEC) 40 MG capsule [id number] No Take 1 capsule (40 mg total) by mouth 2 (two) times daily before a meal. Evander Hills, PA-C Taking Active   ondansetron  (ZOFRAN ) 4 MG tablet [id number] No Take 1 tablet (4 mg total) by mouth every 8 (eight) hours as needed for nausea or vomiting. For cancer related nausea Evander Hills, PA-C Taking Active   OXcarbazepine  (TRILEPTAL ) 150 MG tablet  [id number] No Take 1 tablet (150 mg total) by mouth 2 (two) times daily. Aura Leeds [place], DO Taking Active Self  oxyCODONE  (OXYCONTIN ) 20 mg 12 hr tablet [id number] No Take 1 tablet (20 mg total) by mouth every 12 (twelve) hours. Jodi Munroe, NP Taking Active   potassium chloride  (KLOR-CON ) 10 MEQ tablet [id number] No Take 1 tablet (10 mEq total) by mouth daily. [place], [place] Taking Expired 10/23/23 2359 Self  pregabalin  (LYRICA ) 200 MG capsule [id number] No Take 200 mg by mouth 2 (two)  times daily. [provider] Taking Active Self           Med Note (WARD, Aneta Keepers   Wed Oct 07, 2023  7:56 PM) Pt needs refill  rOPINIRole  (REQUIP ) 1 MG tablet [id number] No Take 1 tablet (1 mg total) by mouth at bedtime. For leg cramps [name], MD Taking Active Self  tirzepatide  (MOUNJARO ) 12.5 MG/0.5ML Pen [id number] No Inject 12.5 mg into the skin once a week. Shamleffer, Julian Obey, MD Taking Active Self  Vitamin D , Cholecalciferol , 25 MCG (1000 UT) TABS [id number] No Take 1,000 Units by mouth daily in the afternoon. [provider] Taking Active Self  Med List Note Levi Real, CPhT 08/29/22 1018): [employer] insurance             Recommendation:   PCP Follow-up Take stool specimen to doctor's office today  Follow Up Plan:   Telephone follow-up 11/11/23 @ 1115 am  Cecilie Coffee [facility], BSN RN Care Manager/ Transition of Care [place]/ [facility] [phone]

## 2023-11-06 NOTE — Telephone Encounter (Signed)
 All questions answered closing call

## 2023-11-06 NOTE — Patient Instructions (Signed)
 Visit Information  Thank you for taking time to visit with me today. Please don't hesitate to contact me if I can be of assistance to you before our next scheduled telephone appointment.  Our next appointment is by telephone on 11/11/23 @ 1115 am  Following is a copy of your care plan:   Goals Addressed             This Visit's Progress    [facility] Transitions of Care (TOC) Care Plan       Problems:  Recent Hospitalization for treatment of GI bleed Patient reports she has no GI bleeding since hospital discharge but she is having "explosive diarrhea", she made an appointment to see primary care provider today, pt weighs daily, checks CBG daily, checks 02 saturation Patient reports CBG fasting is 130, weight 172 pounds, continues to have diarrhea, now has nausea, zofran  is helping, pt states she finishes antibiotic today and hopes diarrhea will subside, will follow up with primary care provider tomorrow 10/21/23, pt states she has been to ED and urgent care since last conversation regarding diarrhea, pt states " they gave me the medicine for nausea", pt states she has " some bleeding few days ago"    Denies bleeding today. Per ED/ Urgent Care- EMR states pt had no dehydration, had labs, exam and imaging with no acute findings, pt instructed to drink plain water  and electrolyte solution.  Pt unable to provide stool sample for testing, will need to be done outpatient. 10/29/23- update- pt saw primary care provider 5/21, GI on 5/23- pt states infectious diarrhea and C-difficile ruled out, back on lomotil  as of 5/28, pt states diarrhea is better but not completely resolved, continues having rectal bleeding, GI doctor in process of ordering CT scan and colonoscopy, pt states " they think rectal bleeding is related to diverticulitis"  pt started eating minimal soft food diet yesterday and tolerating well.  Pt continues weighing and checking CBG consistently. 11/06/23- Pt reports diarrhea had seemed to resolve but  now has started again, pt having intermittent diarrhea and continues to have rectal bleeding at times, MD aware, pt states her previous test (stool specimen) was not done correctly and she is taking another specimen to doctor today. Pt states she continues checking CBG, weight is 174 pounds.  Goal:  Over the next 30 days, the patient will not experience hospital readmission  Interventions:   Evaluation of current treatment plan related to GI bleed, diarrhea self-management and patient's adherence to plan as established by provider. Discussed plans with patient for ongoing care management follow up and provided patient with direct contact information for care management team Reviewed after visit summaries from recent GI and primary care provider appointment with pt Reinforced symptoms of dehydration and importance of staying well hydrated with episodes of diarrhea Reviewed signs/ symptoms GI bleeding, reportable signs/ symptoms Reviewed importance of attending all scheduled appointments, going to ED if needed  Patient Self Care Activities:  Attend all scheduled provider appointments Call pharmacy for medication refills 3-7 days in advance of running out of medications Call provider office for new concerns or questions  Notify RN Care Manager of TOC call rescheduling needs Participate in Transition of Care Program/Attend TOC scheduled calls Perform all self care activities independently  Take medications as prescribed   call office if I gain more than 2 pounds in one day or 5 pounds in one week track weight in diary watch for swelling in feet, ankles and legs every day weigh myself  daily develop a rescue plan follow rescue plan if symptoms flare-up Let your doctor know if you have any symptoms of worsening GI bleeding, diarrhea Continue weighing daily, checking blood sugar and keeping a log Stay well hydrated- continue to drink water  and electrolyte solution  Plan:  Telephone follow up  appointment with care management team member scheduled for:  11/11/23 @ 1115 am The patient has been provided with contact information for the care management team and has been advised to call with any health related questions or concerns.         Patient verbalizes understanding of instructions and care plan provided today and agrees to view in MyChart. Active MyChart status and patient understanding of how to access instructions and care plan via MyChart confirmed with patient.     Telephone follow up appointment with care management team member scheduled for: 11/11/23 @ 1115 am  Please call the care guide team at [phone] if you need to cancel or reschedule your appointment.   Please call the Suicide and Crisis Lifeline: 988 call the [place]  National Suicide Prevention Lifeline: 860 460 9998 or TTY: (214) 835-7081 TTY 504-534-9690) to talk to a trained counselor call 1-800-273-TALK (toll free, 24 hour hotline) go to [facility] [address], [place] [phone]) call the [facility] Crisis Line: [phone] call 911 if you are experiencing a Mental Health or Behavioral Health Crisis or need someone to talk to.  Cecilie Coffee [facility], BSN RN Care Manager/ Transition of Care [place]/ [facility] [phone]

## 2023-11-06 NOTE — Telephone Encounter (Signed)
 Copied from CRM [id number]. Topic: Clinical - Request for Lab/Test Order >> Nov 06, 2023 10:55 AM Georgeann Kindred wrote: Reason for CRM: Patient called stating that she was unsure of whether she should bring in her stool sample. Seen the lab ordered and informed patient that it was in the system and okay to bring in.

## 2023-11-06 NOTE — Telephone Encounter (Signed)
 Copied from CRM [id number]. Topic: Clinical - Lab/Test Results >> Nov 06, 2023  3:56 PM Amber Stephenson wrote: Reason for CRM: Patient is calling in about stool sample results. Please contact at [phone] when results are ready.

## 2023-11-07 LAB — GI PROFILE, STOOL, PCR

## 2023-11-09 NOTE — Telephone Encounter (Signed)
 Pt notified. States that she is not feeling any better. Going to see specialist July 1st.

## 2023-11-09 NOTE — Telephone Encounter (Signed)
It was negative for infection

## 2023-11-10 ENCOUNTER — Telehealth: Payer: Self-pay

## 2023-11-10 NOTE — Telephone Encounter (Signed)
 Patient husband called stating that Oxycontin  was suppose to be sent in for patient. According to records a prescription for Oxycodone  ER is in there with DNF 12/10/23, no other prescription for Oxycodone  ER.

## 2023-11-11 ENCOUNTER — Telehealth: Payer: Self-pay | Admitting: Registered Nurse

## 2023-11-11 ENCOUNTER — Other Ambulatory Visit: Payer: Self-pay | Admitting: *Deleted

## 2023-11-11 ENCOUNTER — Encounter: Payer: Self-pay | Admitting: Pulmonary Disease

## 2023-11-11 ENCOUNTER — Ambulatory Visit (INDEPENDENT_AMBULATORY_CARE_PROVIDER_SITE_OTHER): Admitting: Pulmonary Disease

## 2023-11-11 VITALS — Wt 172.0 lb

## 2023-11-11 VITALS — BP 117/78 | HR 78 | Ht 62.0 in | Wt 172.0 lb

## 2023-11-11 DIAGNOSIS — R0602 Shortness of breath: Secondary | ICD-10-CM

## 2023-11-11 DIAGNOSIS — R197 Diarrhea, unspecified: Secondary | ICD-10-CM

## 2023-11-11 DIAGNOSIS — G4734 Idiopathic sleep related nonobstructive alveolar hypoventilation: Secondary | ICD-10-CM | POA: Diagnosis not present

## 2023-11-11 NOTE — Telephone Encounter (Signed)
 PMP was Reviewed.  Amber Stephenson, Amber Stephenson was called regarding her Oxycontin  prescription.  [facility] was called regarding her Oxycontin  prescription. Spoke with Pharmacist they have her June and July prescriptions for Oxycontin . Amber Stephenson was called regarding the above, she verbalizes understanding.

## 2023-11-11 NOTE — Progress Notes (Signed)
 Amber Stephenson    [id number]    [birth year]  Primary Care Physician:Stacks, Vallorie Gayer, MD  Referring Physician: [name], MD [address] [place],  [place] [address]  Chief complaint:   Patient being seen for shortness of breath HPI:  Shortness of breath for which she uses albuterol  as needed  She was on oxygen  supplementation but this has been discontinued She feels she still needs oxygen   When she checks her oxygen  is usually in the 90s but feels she benefits from using oxygen   She does have a history of sleep apnea Did not tolerate CPAP in the past  Recently diagnosed with cervical cancer for which she had radiation treatments Since then has had a lot of diarrhea and fatigue  She has chronic back pain unable to ambulate for long distances Past history of falls  Still undergoing evaluation for GI symptoms, has an appointment to see GI soon  She was placed on oxygen  supplementation following inability to tolerate CPAP She is interested in pursuing other avenues for treating her sleep apnea  She does get short of breath with most activity  Wakes up sometimes feeling short of breath  She does have an underlying history of asthma, history of diastolic congestive heart failure, atrial fibrillation, pulmonary hypertension   Outpatient Encounter Medications as of 11/11/2023  Medication Sig   ANUCORT-HC  25 MG suppository Place 25 mg rectally daily.   ARIPiprazole  (ABILIFY ) 2 MG tablet Take 2 mg by mouth at bedtime.   BELSOMRA  20 MG TABS Take 1 tablet by mouth at bedtime as needed.   carbidopa -levodopa  (SINEMET ) 10-100 MG tablet Take 1 tablet by mouth 3 (three) times daily. For restless leg syndrome   colchicine  0.6 MG tablet Take 1 tablet (0.6 mg total) by mouth 2 (two) times daily. For mouth sores   Continuous Glucose Sensor (DEXCOM G7 SENSOR) MISC CHANGE SENSOR EVERY 10 DAYS   cyanocobalamin  (VITAMIN B12) 1000 MCG tablet Take 1 tablet (1,000 mcg total) by  mouth daily.   denosumab  (PROLIA ) 60 MG/ML SOSY injection Inject 60 mg into the skin every 6 (six) months.   diphenoxylate -atropine  (LOMOTIL ) 2.5-0.025 MG tablet Take 2 tablets by mouth 4 (four) times daily as needed for diarrhea or loose stools.   ELIQUIS  5 MG TABS tablet TAKE 1 TABLET TWICE A DAY   escitalopram  (LEXAPRO ) 10 MG tablet Take 10 mg by mouth daily.   fluticasone -salmeterol (ADVAIR DISKUS) 500-50 MCG/ACT AEPB Inhale 1 puff into the lungs in the morning and at bedtime.   furosemide  (LASIX ) 40 MG tablet Take 40 mg by mouth daily.   hydrOXYzine  (VISTARIL ) 25 MG capsule Take 1 capsule (25 mg total) by mouth 3 (three) times daily.   insulin  glargine (LANTUS  SOLOSTAR) 100 UNIT/ML Solostar Pen Inject 30 Units into the skin 2 (two) times daily.   insulin  lispro (HUMALOG  KWIKPEN) 100 UNIT/ML KwikPen Max daily 45 units (Patient taking differently: Inject 6-45 Units into the skin 3 (three) times daily. Max daily 45 units)   Insulin  Pen Needle 29G X MISC 1 Device by Does not apply route daily in the afternoon.   LORazepam  (ATIVAN ) 0.5 MG tablet Take 0.5-1 mg by mouth in the morning, at noon, and at bedtime. Take 0.5 mg by mouth in the morning, 0.5 mg at lunch, and 0.5 mg at bedtime   losartan  (COZAAR ) 25 MG tablet Take 25 mg by mouth daily.   MAGNESIUM  PO Take 1 tablet by mouth daily.  meclizine  (ANTIVERT ) 25 MG tablet Take 1 tablet (25 mg total) by mouth 3 (three) times daily as needed for dizziness.   metFORMIN  (GLUCOPHAGE -XR) 500 MG 24 hr tablet Take 2 tablets (1,000 mg total) by mouth daily with breakfast.   Methocarbamol  1000 MG TABS Take 1,000 mg by mouth every 6 (six) hours as needed. (Patient taking differently: Take 1,000 mg by mouth every 6 (six) hours as needed (muscle spasms).)   metoprolol  tartrate (LOPRESSOR ) 50 MG tablet Take 1 tablet (50 mg total) by mouth 2 (two) times daily.   naloxone  (NARCAN ) nasal spray 4 mg/0.1 mL Place 1 spray into the nose daily as needed (for over  dose).   nystatin  (MYCOSTATIN /NYSTOP ) powder Apply 1 Application topically as needed (rash).   nystatin  ointment (MYCOSTATIN ) Apply 1 Application topically 2 (two) times daily as needed (for rashes).   omeprazole  (PRILOSEC) 40 MG capsule Take 1 capsule (40 mg total) by mouth 2 (two) times daily before a meal.   ondansetron  (ZOFRAN ) 4 MG tablet Take 1 tablet (4 mg total) by mouth every 8 (eight) hours as needed for nausea or vomiting. For cancer related nausea   OXcarbazepine  (TRILEPTAL ) 150 MG tablet Take 1 tablet (150 mg total) by mouth 2 (two) times daily.   oxyCODONE  (OXYCONTIN ) 20 mg 12 hr tablet Take 1 tablet (20 mg total) by mouth every 12 (twelve) hours.   pregabalin  (LYRICA ) 200 MG capsule Take 200 mg by mouth 2 (two) times daily.   rOPINIRole  (REQUIP ) 1 MG tablet Take 1 tablet (1 mg total) by mouth at bedtime. For leg cramps   tirzepatide  (MOUNJARO ) 12.5 MG/0.5ML Pen Inject 12.5 mg into the skin once a week.   Vitamin D , Cholecalciferol , 25 MCG (1000 UT) TABS Take 1,000 Units by mouth daily in the afternoon.   atorvastatin  (LIPITOR) 80 MG tablet Take 1 tablet (80 mg total) by mouth daily. (Patient taking differently: Take 80 mg by mouth every evening.)   potassium chloride  (KLOR-CON ) 10 MEQ tablet Take 1 tablet (10 mEq total) by mouth daily.   Facility-Administered Encounter Medications as of 11/11/2023  Medication   bupivacaine -epinephrine  (MARCAINE  W/ EPI) 0.5% -1:200000 injection    Allergies as of 11/11/2023 - Review Complete 11/11/2023  Allergen Reaction Noted   Iodine Anaphylaxis 06/28/2003   Ivp dye [iodinated contrast media] Anaphylaxis, Swelling, and Other (See Comments) 12/13/2014   Latex Other (See Comments) 09/29/2018   Tape Other (See Comments) 01/28/2023   Tizanidine  Other (See Comments) 03/10/2022    Past Medical History:  Diagnosis Date   Allergic rhinitis 02/24/2019   Ambulatory dysfunction 04/23/2020   Atrial fibrillation with RVR (HCC) 05/19/2017   Back  pain/sacroiliitis--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   Benign paroxysmal positional vertigo due to bilateral vestibular disorder 05/20/2019   Bipolar 1 disorder (HCC) 01/23/2015   with GAD, benzo dependence   CAD (coronary artery disease)    Nonobstructive; Managed by Dr. [name]   Cardiomegaly 01/12/2018   CHF (congestive heart failure) 06/08/2022   Chronic back pain 01/04/2015   Chronic constipation 04/25/2020   Chronic diastolic heart failure (HCC) 05/30/2015   Chronic pain syndrome 08/22/2019   back pain, sacroiliitis   Chronic post-traumatic stress disorder (PTSD) 12/06/2020   Diabetic neuropathy (HCC) 02/06/2016   Dyslipidemia 09/24/2020   Essential hypertension    Functional diarrhea 10/26/2020   Herpes genitalis in women 07/16/2015   History of adenomatous polyp of  colon 05/21/2019   Overview:   03/31/17: Colonoscopy: nonadvanced adenoma, microscopic colitis, f/u 5 yrs, [facility]/GAP   History of radiation therapy    Endometrial- 02/18/23-03/31/23-Dr. Royston Cornea Kinard   Hypotension 09/01/2022   Insomnia 01/23/2015   Metabolic acidosis 01/12/2023   Migraine headache with aura 02/12/2016   Myofascial pain dysfunction syndrome 08/22/2019   Non-alcoholic fatty liver disease 01/12/2018   OSA (obstructive sleep apnea) 02/24/2019   10/09/2018 - HST  - AHI 40.6    Osteopenia 12/06/2020   Rx alendronate  35 mg.   Pinguecula 12/06/2020   Presbyopia 12/06/2020   Pulmonary hypertension    RLS (restless legs syndrome) 04/27/2015   RSV (respiratory syncytial virus infection) 06/10/2023   Tremor, essential 12/11/2021   Type II diabetes mellitus (HCC) 09/24/2020    Past Surgical History:  Procedure Laterality Date   BREAST REDUCTION SURGERY     COLONOSCOPY  2018   6 mm cecal tubular adenoma, sigmoid diverticulosis, random colon biopsies consistent with microscopic colitis   EYE  SURGERY Right    cateracts   HAMMER TOE SURGERY     LEFT HEART CATH AND CORONARY ANGIOGRAPHY N/A 02/03/2018   Procedure: LEFT HEART CATH AND CORONARY ANGIOGRAPHY;  Surgeon: [name], Peter M, MD;  Location: [facility];  Service: Cardiovascular;  Laterality: N/A;   REVERSE SHOULDER ARTHROPLASTY Right 08/19/2019   Procedure: REVERSE SHOULDER ARTHROPLASTY;  Surgeon: Winston Hawking, MD;  Location: [facility];  Service: Orthopedics;  Laterality: Right;  interscalene block   SHOULDER SURGERY Right    I BROKE MY SHOUDLER   THIGH SURGERY     TO REMOVE A TUMOR     Family History  Problem Relation Age of Onset   Diabetes Mother    Heart disease Mother    Alzheimer's disease Father    Heart disease Sister        CABG   Diabetes Sister    Alcohol  abuse Sister    Stroke Brother    Heart disease Brother    Mental illness Brother    Diabetes Brother    Breast cancer Neg Hx    Ovarian cancer Neg Hx    Colon cancer Neg Hx    Endometrial cancer Neg Hx     Social History   Socioeconomic History   Marital status: Married    Spouse name: Financial planner   Number of children: 3   Years of education: 15   Highest education level: Associate degree: academic program  Occupational History   Occupation: Retired    Comment: Chief Financial Officer  Tobacco Use   Smoking status: Never   Smokeless tobacco: Never  Vaping Use   Vaping status: Never Used  Substance and Sexual Activity   Alcohol  use: Not Currently    Alcohol /week: 0.0 standard drinks of alcohol    Drug use: No   Sexual activity: Yes    Partners: Male    Birth control/protection: Post-menopausal  Other Topics Concern   Not on file  Social History Narrative   Lives at home with husband.    They have 3 children who live away - [place] , [place] , and [place] .   She is from [place]  and most of her family lives there.   Her husbands's family live nearby   Right handed.   Social Drivers of Health   Financial Resource Strain: Low Risk   (08/13/2023)   Overall Financial Resource Strain (CARDIA)    Difficulty of Paying Living Expenses: Not hard at all  Food Insecurity: No Food Insecurity (10/12/2023)  Hunger Vital Sign    Worried About Running Out of Food in the Last Year: Never true    Ran Out of Food in the Last Year: Never true  Transportation Needs: No Transportation Needs (10/12/2023)   PRAPARE - Administrator, Civil Service (Medical): No    Lack of Transportation (Non-Medical): No  Physical Activity: Insufficiently Active (08/13/2023)   Exercise Vital Sign    Days of Exercise per Week: 3 days    Minutes of Exercise per Session: 20 min  Stress: No Stress Concern Present (08/13/2023)   [employer] of Occupational Health - Occupational Stress Questionnaire    Feeling of Stress : Only a little  Social Connections: Moderately Integrated (10/07/2023)   Social Connection and Isolation Panel [NHANES]    Frequency of Communication with Friends and Family: More than three times a week    Frequency of Social Gatherings with Friends and Family: Once a week    Attends Religious Services: More than 4 times per year    Active Member of [employer] or Organizations: No    Attends Banker Meetings: Never    Marital Status: Married  Catering manager Violence: Not At Risk (10/12/2023)   Humiliation, Afraid, Rape, and Kick questionnaire    Fear of Current or Ex-Partner: No    Emotionally Abused: No    Physically Abused: No    Sexually Abused: No    Review of Systems  Constitutional:  Positive for fatigue.  Respiratory:  Positive for apnea and shortness of breath.   Psychiatric/Behavioral:  Positive for sleep disturbance.     Vitals:   11/11/23 1457  BP: 117/78  Pulse: 78  SpO2: 97%     Physical Exam Constitutional:      Appearance: She is obese.  HENT:     Head: Normocephalic.     Nose: No congestion.  Eyes:     Pupils: Pupils are equal, round, and reactive to light.  Cardiovascular:      Rate and Rhythm: Normal rate and regular rhythm.     Heart sounds: No murmur heard.    No friction rub.  Pulmonary:     Effort: No respiratory distress.     Breath sounds: No stridor. No wheezing or rhonchi.  Musculoskeletal:     Cervical back: No rigidity or tenderness.  Neurological:     Mental Status: She is alert.  Psychiatric:        Mood and Affect: Mood normal.    Data Reviewed: Sleep study significant for moderate obstructive sleep apnea - Performed 02/20/2019  Recent echocardiogram with normal ejection fraction 60-65%, left ventricular diastolic function could not be measured Right ventricular systolic function is normal, normal right ventricular size  Assessment:  History of obstructive sleep apnea - Intolerant of CPAP in the past - Willing to consider other options of treatment - We will go ahead and order a sleep study to ascertain severity of sleep disordered breathing  Nocturnal hypoxemia - We will order an overnight oximetry  Shortness of breath with exertion and also waking up in the middle of the night with shortness of breath - Will renew nebulizer with albuterol   History of diastolic heart failure - Optimized  Severe deconditioning - Graded activities as tolerated  Plan/Recommendations:  Placed order for home sleep test  Placed order for overnight oximetry-to be performed on room air  Ambulated to ascertain qualification for oxygen  supplementation  Encourage graded activity as tolerated  Prescription for new nebulizer with albuterol   Follow-up in about 3 months  Myer Artis MD [facility] 11/11/2023, 3:11 PM  CC: [name], MD

## 2023-11-11 NOTE — Patient Outreach (Signed)
 Transition of Care week 5  Visit Note  11/11/2023  Name: Amber Stephenson MRN: [id number]          DOB: [birth year]  Situation: Patient enrolled in [facility] 30-day program. Visit completed with patient by telephone.   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   Benign paroxysmal positional vertigo due to bilateral vestibular disorder 05/20/2019   Bipolar 1 disorder (HCC) 01/23/2015   with GAD, benzo dependence   CAD (coronary artery disease)    Nonobstructive; Managed by Dr. [name]   Cardiomegaly 01/12/2018   CHF (congestive heart failure) 06/08/2022   Chronic back pain 01/04/2015   Chronic constipation 04/25/2020   Chronic diastolic heart failure (HCC) 05/30/2015   Chronic pain syndrome 08/22/2019   back pain, sacroiliitis   Chronic post-traumatic stress disorder (PTSD) 12/06/2020   Diabetic neuropathy (HCC) 02/06/2016   Dyslipidemia 09/24/2020   Essential hypertension    Functional diarrhea 10/26/2020   Herpes genitalis in women 07/16/2015   History of adenomatous polyp of colon 05/21/2019   Overview:   03/31/17: Colonoscopy: nonadvanced adenoma, microscopic colitis, f/u 5 yrs, [facility]/GAP   History of radiation therapy    Endometrial- 02/18/23-03/31/23-Dr. Royston Cornea Kinard   Hypotension 09/01/2022   Insomnia 01/23/2015   Metabolic acidosis 01/12/2023   Migraine headache with aura 02/12/2016   Myofascial pain dysfunction syndrome 08/22/2019   Non-alcoholic fatty liver disease 01/12/2018   OSA (obstructive sleep apnea) 02/24/2019   10/09/2018 - HST  - AHI 40.6    Osteopenia 12/06/2020   Rx alendronate  35 mg.   Pinguecula 12/06/2020   Presbyopia 12/06/2020   Pulmonary hypertension    RLS  (restless legs syndrome) 04/27/2015   RSV (respiratory syncytial virus infection) 06/10/2023   Tremor, essential 12/11/2021   Type II diabetes mellitus (HCC) 09/24/2020    Assessment: Patient Reported Symptoms: Cognitive Cognitive Status: Able to follow simple commands, Alert and oriented to person, place, and time, Normal speech and language skills      Neurological Neurological Review of Symptoms: No symptoms reported    HEENT HEENT Symptoms Reported: No symptoms reported      Cardiovascular Cardiovascular Symptoms Reported: No symptoms reported Does patient have uncontrolled Hypertension?: No Cardiovascular Conditions: Hypertension, Heart failure Cardiovascular Management Strategies: Adequate rest, Medication therapy Weight: 172 lb (78 kg) Cardiovascular Self-Management Outcome: 3 (uncertain) Cardiovascular Comment: pt weighs daily, checks 02 saturation prn  Respiratory Respiratory Symptoms Reported: Shortness of breath Other Respiratory Symptoms: chronic dyspnea with exertion Respiratory Self-Management Outcome: 3 (uncertain)  Endocrine Patient reports the following symptoms related to hypoglycemia or hyperglycemia : No symptoms reported Is patient diabetic?: Yes Is patient checking blood sugars at home?: Yes Endocrine Conditions: Diabetes Endocrine Management Strategies: Adequate rest Endocrine Self-Management Outcome: 4 (good)  Gastrointestinal Gastrointestinal Symptoms Reported: Diarrhea Other Gastrointestinal Symptoms: pt states diarrhea continues but is much improved, pt took another stool specimen to primary care provider, reports unremarkable Additional Gastrointestinal Details: pt to follow up with GI Dr. Riley Cheadle on 11/27/23 Gastrointestinal Conditions: Diarrhea Gastrointestinal Management Strategies: Adequate rest, Medication therapy Gastrointestinal Self-Management Outcome: 3 (uncertain) Gastrointestinal Comment: pt continues drinking adequate fluids, continues to  follow up with GI Nutrition Risk Screen (CP):  (diarrhea)  Genitourinary Genitourinary Symptoms Reported: No symptoms reported  Integumentary Integumentary Symptoms Reported: No symptoms reported    Musculoskeletal Musculoskelatal Symptoms Reviewed: No symptoms reported        Psychosocial Psychosocial Symptoms Reported: No symptoms reported         There were no vitals filed for this visit.  Medications Reviewed Today     Reviewed by Daralyn Earl, RN (Registered Nurse) on 11/11/23 at 1044  Med List Status: <None>   Medication Order Taking? Sig Documenting Provider Last Dose Status Informant  ANUCORT-HC  25 MG suppository [id number] No Place 25 mg rectally daily. [provider] Taking Active Self  ARIPiprazole  (ABILIFY ) 2 MG tablet [id number] No Take 2 mg by mouth at bedtime. [provider] Taking Active Self  atorvastatin  (LIPITOR) 80 MG tablet [id number] No Take 1 tablet (80 mg total) by mouth daily.  Patient taking differently: Take 80 mg by mouth every evening.   V[place], [place] Taking Expired 10/23/23 2359 Self  BELSOMRA  20 MG TABS [id number] No Take 1 tablet by mouth at bedtime as needed. [provider] Taking Active   carbidopa -levodopa  (SINEMET ) 10-100 MG tablet [id number]  Take 1 tablet by mouth 3 (three) times daily. For restless leg syndrome [name], MD  Active   colchicine  0.6 MG tablet [id number] No Take 1 tablet (0.6 mg total) by mouth 2 (two) times daily. For mouth sores [name], MD Taking Active   Continuous Glucose Sensor (DEXCOM G7 SENSOR) MISC [id number] No CHANGE SENSOR EVERY 10 DAYS Shamleffer, Julian Obey, MD Taking Active Self  cyanocobalamin  (VITAMIN B12) 1000 MCG tablet [id number] No Take 1 tablet (1,000 mcg total) by mouth daily. Burton Casey, MD Taking Active Self  denosumab  (PROLIA ) 60 MG/ML SOSY injection [id number] No Inject 60 mg into the skin every 6 (six) months. [name], MD Taking Active  Self           Med Note Author Board, Curley Double Oct 07, 2023  8:01 PM) January**  diphenoxylate -atropine  (LOMOTIL ) 2.5-0.025 MG tablet [id number]  Take 2 tablets by mouth 4 (four) times daily as needed for diarrhea or loose stools. [name], MD  Active   ELIQUIS  5 MG TABS tablet [id number] No TAKE 1 TABLET TWICE A DAY Turner, Traci R, MD Taking Active   escitalopram  (LEXAPRO ) 10 MG tablet [id number] No Take 10 mg by mouth daily. [provider] Taking Active Self           Med Note (WARD, ANGELICA G   Wed Oct 07, 2023  7:56 PM) Pt needs refill  fluticasone -salmeterol (ADVAIR DISKUS) 500-50 MCG/ACT AEPB [id number] No Inhale 1 puff into the lungs in the morning and at bedtime. [name], MD Taking Active Self           Med Note (WARD, Curley Double Oct 07, 2023  8:09 PM) Pt has not started this medication yet   furosemide  (LASIX ) 40 MG tablet [id number] No Take 40 mg by mouth daily. [provider] Taking Active Self  hydrOXYzine  (VISTARIL ) 25 MG capsule [id number] No Take 1 capsule (25 mg total) by mouth 3 (three) times daily. [name], MD Taking Active Self  insulin  glargine (LANTUS  SOLOSTAR) 100 UNIT/ML Solostar Pen [id number] No Inject 30 Units into the skin 2 (two) times daily. Shamleffer, Ibtehal Jaralla, MD Taking Active Self  insulin  lispro (HUMALOG  KWIKPEN) 100 UNIT/ML KwikPen [id number] No Max daily 45 units  Patient taking differently: Inject 6-45 Units into the skin 3 (three) times daily. Max daily 45 units  Shamleffer, Julian Obey, MD Taking Active Self           Med Note (WARD, Aneta Keepers   Wed Oct 07, 2023  7:57 PM) 12 units*  Insulin  Pen Needle 29G X MISC [id number] No 1 Device by Does not apply route daily in the afternoon. Shamleffer, Julian Obey, MD Taking Active Self  LORazepam  (ATIVAN ) 0.5 MG tablet [id number] No Take 0.5-1 mg by mouth in the morning, at noon, and at bedtime. Take 0.5 mg by mouth in the morning, 0.5 mg at lunch, and 0.5 mg  at bedtime [provider] Taking Active Self           Med Note (WARD, ANGELICA G   Wed Oct 07, 2023  7:56 PM) Pt needs refill  losartan  (COZAAR ) 25 MG tablet [id number] No Take 25 mg by mouth daily. [provider] Taking Active Self  MAGNESIUM  PO [id number] No Take 1 tablet by mouth daily. [provider] Taking Active Self  meclizine  (ANTIVERT ) 25 MG tablet [id number] No Take 1 tablet (25 mg total) by mouth 3 (three) times daily as needed for dizziness. Dettinger, Lucio Sabin, MD Taking Active Self  metFORMIN  (GLUCOPHAGE -XR) 500 MG 24 hr tablet [id number] No Take 2 tablets (1,000 mg total) by mouth daily with breakfast. Shamleffer, Ibtehal Jaralla, MD Taking Active Self  Methocarbamol  1000 MG TABS [id number] No Take 1,000 mg by mouth every 6 (six) hours as needed.  Patient taking differently: Take 1,000 mg by mouth every 6 (six) hours as needed (muscle spasms).   Lovorn, Megan, MD Taking Active Self  metoprolol  tartrate (LOPRESSOR ) 50 MG tablet [id number] No Take 1 tablet (50 mg total) by mouth 2 (two) times daily. Jacqueline Matsu, MD Taking Active Self  naloxone  (NARCAN ) nasal spray 4 mg/0.1 mL [id number] No Place 1 spray into the nose daily as needed (for over dose). [provider] Taking Active Self           Med Note (CRUTHIS, CHLOE C   Wed Aug 19, 2023  1:55 PM)    nystatin  (MYCOSTATIN /NYSTOP ) powder [id number] No Apply 1 Application topically as needed (rash). Shamleffer, Julian Obey, MD Taking Active Self           Med Note (CRUTHIS, CHLOE C   Wed Aug 19, 2023  1:55 PM)    nystatin  ointment (MYCOSTATIN ) [id number] No Apply 1 Application topically 2 (two) times daily as needed (for rashes). Javan Messing, NP Taking Active Self           Med Note (CRUTHIS, CHLOE C   Wed Aug 19, 2023  1:55 PM)    omeprazole  (PRILOSEC) 40 MG capsule [id number] No Take 1 capsule (40 mg total) by mouth 2 (two) times daily before a meal. Evander Hills, PA-C Taking Active    ondansetron  (ZOFRAN ) 4 MG tablet [id number] No Take 1 tablet (4 mg total) by mouth every 8 (eight) hours as needed for nausea or vomiting. For cancer related nausea Evander Hills, PA-C Taking Active   OXcarbazepine  (TRILEPTAL ) 150 MG tablet [id number] No Take 1 tablet (150 mg total) by mouth 2 (two) times daily. Aura Leeds [place], DO Taking Active Self  oxyCODONE  (OXYCONTIN ) 20 mg 12 hr tablet [id number] No Take 1 tablet (20 mg total) by mouth every 12 (twelve) hours. Jodi Munroe, NP Taking Active   potassium chloride  (KLOR-CON ) 10 MEQ tablet [id number] No Take 1 tablet (10 mEq total) by mouth daily. [place], [place] Taking Expired 10/23/23  2359 Self  pregabalin  (LYRICA ) 200 MG capsule [id number] No Take 200 mg by mouth 2 (two) times daily. [provider] Taking Active Self           Med Note (WARD, Aneta Keepers   Wed Oct 07, 2023  7:56 PM) Pt needs refill  rOPINIRole  (REQUIP ) 1 MG tablet [id number] No Take 1 tablet (1 mg total) by mouth at bedtime. For leg cramps [name], MD Taking Active Self  tirzepatide  (MOUNJARO ) 12.5 MG/0.5ML Pen [id number] No Inject 12.5 mg into the skin once a week. Shamleffer, Julian Obey, MD Taking Active Self  Vitamin D , Cholecalciferol , 25 MCG (1000 UT) TABS [id number] No Take 1,000 Units by mouth daily in the afternoon. [provider] Taking Active Self  Med List Note Levi Real, CPhT 08/29/22 1018): [employer] insurance             Goals Addressed             This Visit's Progress    COMPLETED: [facility] Transitions of Care (TOC) Care Plan       Problems:  Recent Hospitalization for treatment of GI bleed Patient reports she has no GI bleeding since hospital discharge but she is having explosive diarrhea, she made an appointment to see primary care provider today, pt weighs daily, checks CBG daily, checks 02 saturation Patient reports CBG fasting is 130, weight 172 pounds, continues to have diarrhea, now has nausea,  zofran  is helping, pt states she finishes antibiotic today and hopes diarrhea will subside, will follow up with primary care provider tomorrow 10/21/23, pt states she has been to ED and urgent care since last conversation regarding diarrhea, pt states  they gave me the medicine for nausea, pt states she has  some bleeding few days ago    Denies bleeding today. Per ED/ Urgent Care- EMR states pt had no dehydration, had labs, exam and imaging with no acute findings, pt instructed to drink plain water  and electrolyte solution.  Pt unable to provide stool sample for testing, will need to be done outpatient. 10/29/23- update- pt saw primary care provider 5/21, GI on 5/23- pt states infectious diarrhea and C-difficile ruled out, back on lomotil  as of 5/28, pt states diarrhea is better but not completely resolved, continues having rectal bleeding, GI doctor in process of ordering CT scan and colonoscopy, pt states  they think rectal bleeding is related to diverticulitis  pt started eating minimal soft food diet yesterday and tolerating well.  Pt continues weighing and checking CBG consistently. 11/06/23- Pt reports diarrhea had seemed to resolve but now has started again, pt having intermittent diarrhea and continues to have rectal bleeding at times, MD aware, pt states her previous test (stool specimen) was not done correctly and she is taking another specimen to doctor today. Pt states she continues checking CBG, weight is 174 pounds. 11/11/23- Pt reports diarrhea is much improved, took another stool specimen to primary care provider, results unremarkable, continues weighing daily and checking CBG daily, pt agreeable to transfer to longitudinal care management  Goal:  Over the next 30 days, the patient will not experience hospital readmission  Interventions:   Evaluation of current treatment plan related to GI bleed, diarrhea self-management and patient's adherence to plan as established by  provider. Discussed plans with patient for ongoing care management follow up and provided patient with direct contact information for care management team Reviewed after visit summaries from recent GI and primary care provider appointment with pt Reviewed symptoms  of dehydration and importance of staying well hydrated with episodes of diarrhea Reinforced signs/ symptoms GI bleeding, reportable signs/ symptoms Reinforced importance of attending all scheduled appointments, going to ED if needed Reviewed plan of care with pt including discharge from [facility] program, transferred pt to longitudinal care management, placed pt on Ashley Gilbert's schedule for 12/03/23 @ 1 pm  Patient Self Care Activities:  Attend all scheduled provider appointments Call pharmacy for medication refills 3-7 days in advance of running out of medications Call provider office for new concerns or questions  Notify RN Care Manager of TOC call rescheduling needs Participate in Transition of Care Program/Attend TOC scheduled calls Perform all self care activities independently  Take medications as prescribed   call office if I gain more than 2 pounds in one day or 5 pounds in one week track weight in diary watch for swelling in feet, ankles and legs every day weigh myself daily develop a rescue plan follow rescue plan if symptoms flare-up Let your doctor know if you have any symptoms of worsening GI bleeding, diarrhea Continue weighing daily, checking blood sugar and keeping a log Stay well hydrated- continue to drink water  and electrolyte solution You are closed out of TOC program, longitudinal care manager will call you on 12/03/23 @ 1 pm- Grandville Lax RN  Plan:  Telephone follow up appointment with care management team member scheduled for:  12/03/23 @ 1 pm with Grandville Lax RN The patient has been provided with contact information for the care management team and has been advised to call with any health related questions  or concerns.         Recommendation:   PCP Follow-up Specialty provider follow-up 11/27/23 @ 930 am GI  Follow Up Plan:   Telephone follow-up with Grandville Lax RN on 12/03/23 @ 1 pm  Cecilie Coffee [facility], BSN RN Care Manager/ Transition of Care [place]/ [facility] [phone]

## 2023-11-11 NOTE — Patient Instructions (Signed)
 Visit Information  Thank you for taking time to visit with me today. Please don't hesitate to contact me if I can be of assistance to you before our next scheduled telephone appointment.  Our next appointment is by telephone on 12/03/23 @ 1 pm  Following is a copy of your care plan:   Goals Addressed             This Visit's Progress    COMPLETED: VBCI Transitions of Care (TOC) Care Plan       Problems:  Recent Hospitalization for treatment of GI bleed Patient reports she has no GI bleeding since hospital discharge but she is having explosive diarrhea, she made an appointment to see primary care provider today, pt weighs daily, checks CBG daily, checks 02 saturation Patient reports CBG fasting is 130, weight 172 pounds, continues to have diarrhea, now has nausea, zofran  is helping, pt states she finishes antibiotic today and hopes diarrhea will subside, will follow up with primary care provider tomorrow 10/21/23, pt states she has been to ED and urgent care since last conversation regarding diarrhea, pt states  they gave me the medicine for nausea, pt states she has  some bleeding few days ago    Denies bleeding today. Per ED/ Urgent Care- EMR states pt had no dehydration, had labs, exam and imaging with no acute findings, pt instructed to drink plain water  and electrolyte solution.  Pt unable to provide stool sample for testing, will need to be done outpatient. 10/29/23- update- pt saw primary care provider 5/21, GI on 5/23- pt states infectious diarrhea and C-difficile ruled out, back on lomotil  as of 5/28, pt states diarrhea is better but not completely resolved, continues having rectal bleeding, GI doctor in process of ordering CT scan and colonoscopy, pt states  they think rectal bleeding is related to diverticulitis  pt started eating minimal soft food diet yesterday and tolerating well.  Pt continues weighing and checking CBG consistently. 11/06/23- Pt reports diarrhea had seemed to  resolve but now has started again, pt having intermittent diarrhea and continues to have rectal bleeding at times, MD aware, pt states her previous test (stool specimen) was not done correctly and she is taking another specimen to doctor today. Pt states she continues checking CBG, weight is 174 pounds. 11/11/23- Pt reports diarrhea is much improved, took another stool specimen to primary care provider, results unremarkable, continues weighing daily and checking CBG daily, pt agreeable to transfer to longitudinal care management  Goal:  Over the next 30 days, the patient will not experience hospital readmission  Interventions:   Evaluation of current treatment plan related to GI bleed, diarrhea self-management and patient's adherence to plan as established by provider. Discussed plans with patient for ongoing care management follow up and provided patient with direct contact information for care management team Reviewed after visit summaries from recent GI and primary care provider appointment with pt Reviewed symptoms of dehydration and importance of staying well hydrated with episodes of diarrhea Reinforced signs/ symptoms GI bleeding, reportable signs/ symptoms Reinforced importance of attending all scheduled appointments, going to ED if needed Reviewed plan of care with pt including discharge from [facility] program, transferred pt to longitudinal care management, placed pt on Ashley Gilbert's schedule for 12/03/23 @ 1 pm  Patient Self Care Activities:  Attend all scheduled provider appointments Call pharmacy for medication refills 3-7 days in advance of running out of medications Call provider office for new concerns or questions  Notify RN Care Manager  of TOC call rescheduling needs Participate in Transition of Care Program/Attend TOC scheduled calls Perform all self care activities independently  Take medications as prescribed   call office if I gain more than 2 pounds in one day or 5 pounds in  one week track weight in diary watch for swelling in feet, ankles and legs every day weigh myself daily develop a rescue plan follow rescue plan if symptoms flare-up Let your doctor know if you have any symptoms of worsening GI bleeding, diarrhea Continue weighing daily, checking blood sugar and keeping a log Stay well hydrated- continue to drink water  and electrolyte solution You are closed out of TOC program, longitudinal care manager will call you on 12/03/23 @ 1 pm- Grandville Lax RN  Plan:  Telephone follow up appointment with care management team member scheduled for:  12/03/23 @ 1 pm with Grandville Lax RN The patient has been provided with contact information for the care management team and has been advised to call with any health related questions or concerns.         Patient verbalizes understanding of instructions and care plan provided today and agrees to view in MyChart. Active MyChart status and patient understanding of how to access instructions and care plan via MyChart confirmed with patient.     Telephone follow up appointment with care management team member scheduled for: 12/03/23 @ 1 pm  Please call the care guide team at [phone] if you need to cancel or reschedule your appointment.   Please call the Suicide and Crisis Lifeline: 988 call the [place]  National Suicide Prevention Lifeline: 908-452-7041 or TTY: 223-714-1419 TTY 616-552-6042) to talk to a trained counselor call 1-800-273-TALK (toll free, 24 hour hotline) go to [facility] [address] [place] [phone]) call the [facility] Crisis Line: [phone] call 911 if you are experiencing a Mental Health or Behavioral Health Crisis or need someone to talk to.  Cecilie Coffee [facility], BSN RN Care Manager/ Transition of Care [place]/ [facility] [phone]

## 2023-11-11 NOTE — Addendum Note (Signed)
 Addended by: Lonie Roa on: 11/11/2023 03:36 PM   Modules accepted: Orders

## 2023-11-11 NOTE — Patient Instructions (Signed)
 Exercise oximetry today to see whether we can qualify you for oxygen   Schedule for home sleep test  Schedule overnight oximetry on room air  Continue using your inhaler  DME referral for new nebulizer  Graded exercises as tolerated  Follow-up in about 3 months

## 2023-11-18 ENCOUNTER — Other Ambulatory Visit (HOSPITAL_COMMUNITY)

## 2023-11-21 ENCOUNTER — Other Ambulatory Visit: Payer: Self-pay | Admitting: Family Medicine

## 2023-11-21 DIAGNOSIS — R197 Diarrhea, unspecified: Secondary | ICD-10-CM

## 2023-11-27 ENCOUNTER — Ambulatory Visit: Admitting: Internal Medicine

## 2023-11-27 ENCOUNTER — Encounter: Payer: Self-pay | Admitting: Internal Medicine

## 2023-11-30 ENCOUNTER — Ambulatory Visit: Payer: Self-pay

## 2023-11-30 NOTE — Telephone Encounter (Signed)
 FYI Only or Action Required?: FYI only for provider.  Patient was last seen in primary care on 11/02/2023 by [name], MD. Called Nurse Triage reporting Fall. Symptoms began several days ago. Interventions attempted: Nothing. Symptoms are: unchanged.  Triage Disposition: See PCP When Office is Open (Within 3 Days)  Patient/caregiver understands and will follow disposition?: Yes, will follow disposition  Copied from CRM [id number]. Topic: Clinical - Red Word Triage >> Nov 30, 2023  4:26 PM Heather  M wrote: Red Word that prompted transfer to Nurse Triage: Patient took a fall and hit her head on thursday thought she was fine but she is now having pain in her back and head and legs. Reason for Disposition  MILD weakness (i.e., does not interfere with ability to work, go to school, normal activities)  (Exception: Mild weakness is a chronic symptom.)  Answer Assessment - Initial Assessment Questions 1. MECHANISM: How did the fall happen?     Hx of falling 3. ONSET: When did the fall happen? (e.g., minutes, hours, or days ago)      4 days ago 4. LOCATION: What part of the body hit the ground? (e.g., back, buttocks, head, hips, knees, hands, head, stomach)     hip 5. INJURY: Did you hurt (injure) yourself when you fell? If Yes, ask: What did you injure? Tell me more about this? (e.g., body area; type of injury; pain severity)     Back, head, and leg pain today 6. PAIN: Is there any pain? If Yes, ask: How bad is the pain? (e.g., Scale 1-10; or mild,  moderate, severe)   - NONE (0): No pain   - MILD (1-3): Doesn't interfere with normal activities    - MODERATE (4-7): Interferes with normal activities or awakens from sleep    - SEVERE (8-10): Excruciating pain, unable to do any normal activities      7 7. SIZE: For cuts, bruises, or swelling, ask: How large is it? (e.g., inches or centimeters)      Bruises-shoulders, L hip/thigh 9. OTHER SYMPTOMS: Do you have any other  symptoms? (e.g., dizziness, fever, weakness; new onset or worsening).      Back pain, leg pain, head 10. CAUSE: What do you think caused the fall (or falling)? (e.g., tripped, dizzy spell)       Loss of balance  Pt denies worsening dizziness. Denies bruising to her head area. Pt states that she has had no change in CMS. Pt states she does take blood thinners.  Protocols used: Falls and [facility]

## 2023-12-01 ENCOUNTER — Ambulatory Visit: Admitting: Internal Medicine

## 2023-12-01 ENCOUNTER — Other Ambulatory Visit: Payer: Self-pay | Admitting: Family Medicine

## 2023-12-01 ENCOUNTER — Ambulatory Visit: Payer: Self-pay | Admitting: Family Medicine

## 2023-12-01 ENCOUNTER — Ambulatory Visit (INDEPENDENT_AMBULATORY_CARE_PROVIDER_SITE_OTHER)

## 2023-12-01 ENCOUNTER — Encounter: Payer: Self-pay | Admitting: Family Medicine

## 2023-12-01 ENCOUNTER — Ambulatory Visit: Admitting: Family Medicine

## 2023-12-01 VITALS — BP 120/80 | HR 62 | Temp 97.9°F | Ht 62.0 in | Wt 184.0 lb

## 2023-12-01 DIAGNOSIS — M25551 Pain in right hip: Secondary | ICD-10-CM

## 2023-12-01 DIAGNOSIS — S7012XA Contusion of left thigh, initial encounter: Secondary | ICD-10-CM | POA: Diagnosis not present

## 2023-12-01 DIAGNOSIS — R296 Repeated falls: Secondary | ICD-10-CM | POA: Diagnosis not present

## 2023-12-01 DIAGNOSIS — M25552 Pain in left hip: Secondary | ICD-10-CM | POA: Diagnosis not present

## 2023-12-01 DIAGNOSIS — M79644 Pain in right finger(s): Secondary | ICD-10-CM | POA: Diagnosis not present

## 2023-12-01 DIAGNOSIS — S7002XA Contusion of left hip, initial encounter: Secondary | ICD-10-CM | POA: Diagnosis not present

## 2023-12-01 DIAGNOSIS — M160 Bilateral primary osteoarthritis of hip: Secondary | ICD-10-CM | POA: Diagnosis not present

## 2023-12-01 MED ORDER — CARBIDOPA-LEVODOPA 10-100 MG PO TABS: 1.0000 | ORAL_TABLET | Freq: Four times a day (QID) | ORAL | 5 refills | Status: DC

## 2023-12-01 MED ORDER — CIPROFLOXACIN-HYDROCORTISONE 0.2-1 % OT SUSP: 3.0000 [drp] | Freq: Two times a day (BID) | OTIC | 0 refills | Status: DC

## 2023-12-01 MED ORDER — HYDROXYZINE PAMOATE 25 MG PO CAPS: 25.0000 mg | ORAL_CAPSULE | Freq: Three times a day (TID) | ORAL | 5 refills | Status: DC

## 2023-12-01 NOTE — Progress Notes (Signed)
 Subjective:  Patient ID: Amber Stephenson, female    DOB: [birth year]  Age: 78 y.o. MRN: [id number]  CC: Fall (Pt fell 4 days ago. Landed on left side. Hip, leg, shoulders and back. Bruised only on left hip. Hit head but pt states it was not bad. Not able to lift leg well. Right finger painful and swollen. ) and refill (hydroxzyzine)   HPI Amber Stephenson presents for falling in the bathroom.  This occurred 4 days ago.  She landed on her left but has having pain on the right as well.  The left leg in particular is painful for range of motion.  There is pain in the fingers of the right hand.  It sounds like she tried to catch herself with the right hand.     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), Benign paroxysmal positional vertigo due to bilateral vestibular disorder (05/20/2019), Bipolar 1 disorder (HCC) (01/23/2015), CAD (coronary artery disease), Cardiomegaly (01/12/2018), CHF (congestive heart failure) (06/08/2022), Chronic back pain (01/04/2015), Chronic constipation (04/25/2020), Chronic diastolic heart failure (HCC) (87/71/7983), Chronic pain syndrome (08/22/2019), Chronic post-traumatic stress disorder (PTSD) (12/06/2020), Diabetic neuropathy (HCC) (02/06/2016), Dyslipidemia (09/24/2020), Essential hypertension, Functional diarrhea (10/26/2020), Herpes genitalis in women (07/16/2015), History of adenomatous polyp of colon (05/21/2019), History of radiation therapy, Hypotension (09/01/2022), Insomnia (01/23/2015), Metabolic acidosis (01/12/2023), Migraine headache with aura  (02/12/2016), Myofascial pain dysfunction syndrome (08/22/2019), Non-alcoholic fatty liver disease (91/86/7980), OSA (obstructive sleep apnea) (02/24/2019), Osteopenia (12/06/2020), Pinguecula (12/06/2020), Presbyopia (12/06/2020), Pulmonary hypertension, RLS (restless legs syndrome) (04/27/2015), RSV (respiratory syncytial virus infection) (06/10/2023), Tremor, essential (12/11/2021), and Type II diabetes mellitus (HCC) (09/24/2020).   She has a past surgical history that includes THIGH SURGERY; Shoulder surgery (Right); Breast reduction surgery; Eye surgery (Right); Hammer toe surgery; LEFT HEART CATH AND CORONARY ANGIOGRAPHY (N/A, 02/03/2018); Reverse shoulder arthroplasty (Right, 08/19/2019); and Colonoscopy (2018).   Her family history includes Alcohol  abuse in her sister; Alzheimer's disease in her father; Diabetes in her brother, mother, and sister; Heart disease in her brother, mother, and sister; Mental illness in her brother; Stroke in her brother.She reports that she has never smoked. She has never used smokeless tobacco. She reports that she does not currently use alcohol . She reports that she does not use drugs.    ROS Review of Systems  Constitutional: Negative.   HENT: Negative.    Eyes:  Negative for visual disturbance.  Respiratory:  Negative for shortness of breath.   Cardiovascular:  Negative for chest pain.  Gastrointestinal:  Negative for abdominal pain.  Musculoskeletal:  Positive for arthralgias and gait problem.    Objective:  BP 120/80   Pulse 62   Temp 97.9 F (36.6 C)   Ht 5' 2 (1.575 m)   Wt 184 lb (83.5 kg)   SpO2 95%   BMI 33.65 kg/m   BP Readings from Last 3 Encounters:  12/07/23 130/74  12/01/23 120/80  11/11/23 117/78    Wt Readings from Last 3 Encounters:  12/07/23 177 lb (80.3 kg)  12/01/23 184 lb (83.5 kg)  11/11/23  172 lb (78 kg)     Physical Exam Constitutional:      General: She is not in acute distress.    Appearance: She is  well-developed.  Cardiovascular:     Rate and Rhythm: Normal rate and regular rhythm.  Pulmonary:     Breath sounds: Normal breath sounds.  Musculoskeletal:        General: Tenderness (Right iliac crest and posterior iliac spine.  Left posterior hip) present. Normal range of motion.  Skin:    General: Skin is warm and dry.     Findings: Bruising (Noted at the posterior aspect of the left hip and buttocks.  Although there is some tenderness on the right no bruising was noted.) present.  Neurological:     Mental Status: She is alert and oriented to person, place, and time.   X-ray shows no sign of acute fracture.   Assessment & Plan:  Contusion, hip and thigh, left, initial encounter  Acute pain of right hip -     DG HIP UNILAT W OR W/O PELVIS 2-3 VIEWS RIGHT; Future  Finger pain, right -     DG Finger Ring Right; Future  Frequent falls  Other orders -     Carbidopa -Levodopa ; Take 1 tablet by mouth 4 (four) times daily. For restless leg syndrome  Dispense: 120 tablet; Refill: 5 -     Ciprofloxacin -Hydrocortisone ; Place 3 drops into both ears 2 (two) times daily. For one week  Dispense: 10 mL; Refill: 0 -     hydrOXYzine  Pamoate; Take 1 capsule (25 mg total) by mouth 3 (three) times daily.  Dispense: 30 capsule; Refill: 5     Follow-up: No follow-ups on file.  Butler Der, M.D.

## 2023-12-02 ENCOUNTER — Ambulatory Visit: Payer: Self-pay | Admitting: *Deleted

## 2023-12-02 NOTE — Telephone Encounter (Signed)
 FYI Only or Action Required?: Action required by provider: clinical question for provider.  Patient was last seen in primary care on 12/01/2023 by Amber Lowers, MD. Called Nurse Triage reporting Leg Injury. Symptoms began several days ago. Interventions attempted: Prescription medications: pain medications already prescribed. Symptoms are: gradually worsening.  Triage Disposition: See HCP Within 4 Hours (Or PCP Triage)  Patient/caregiver understands and will follow disposition?: Patient states she is unable to go anywhere today- wants to know if she needs Xray of foot/ankle Reason for Disposition  [1] SEVERE pain (e.g., excruciating pain, unable to do any normal activities)  AND [2] not improved 2 hours after pain medicine/ice packs  Answer Assessment - Initial Assessment Questions 1. MECHANISM: How did the injury happen? (e.g., twisting injury, direct blow)      [name]- tripped and fell in bathroom 2. ONSET: When did the injury happen? (Minutes or hours ago)      Thursday 3. LOCATION: Where is the injury located?      Left leg/foot 4. APPEARANCE of INJURY: What does the injury look like?  (e.g., deformity of leg)     Increased swelling- doubled- left leg/foot, back 5. SEVERITY: Can you put weight on that leg? Can you walk?      Barely can tolerate weight 6. SIZE: For cuts, bruises, or swelling, ask: How large is it? (e.g., inches or centimeters)      Bruising- worse- all over Left foot/ankle, knee to hip and back 7. PAIN: Is there pain? If Yes, ask: How bad is the pain?   What does it keep you from doing? (e.g., Scale 1-10; or mild, moderate, severe)   -  NONE: (0): no pain   -  MILD (1-3): doesn't interfere with normal activities    -  MODERATE (4-7): interferes with normal activities (e.g., work or school) or awakens from sleep, limping    -  SEVERE (8-10): excruciating pain, unable to do any normal activities, unable to walk     7/8 out of 10 8. TETANUS: For any  breaks in the skin, ask: When was the last tetanus booster?     na 9. OTHER SYMPTOMS: Do you have any other symptoms?      No sleep- due pain   Patient reports changes in status- feels she is worse- and she is concerned about her foot/ankle- increased swelling in that area. Also complains of increased pain. Patient advised needs to have changes checked- but she states she is unable to go anywhere today- wants to get provider advice on  recheck.  Protocols used: Leg Injury-A-AH  Copied from CRM [id number]. Topic: Clinical - Red Word Triage >> Dec 02, 2023  3:01 PM [name] wrote: Red Word that prompted transfer to Nurse Triage: Pt fell on Thursday, and was seen by the dr yesterday and states her left leg and foot is a lot more swollen than it was at her appointment yesterday, states she can't even walk on it and is bruised from her to to her back. Pain level of 7-8

## 2023-12-02 NOTE — Telephone Encounter (Signed)
 She had xrays of the areas that hurt the most. She needs to rest at home and the pain will calm down over the next few days

## 2023-12-02 NOTE — Telephone Encounter (Signed)
 Contacted patient and  gave her Dr. Zollie message.

## 2023-12-03 ENCOUNTER — Telehealth: Payer: Self-pay

## 2023-12-03 ENCOUNTER — Ambulatory Visit: Admitting: Gastroenterology

## 2023-12-03 ENCOUNTER — Encounter: Payer: Self-pay | Admitting: *Deleted

## 2023-12-03 ENCOUNTER — Other Ambulatory Visit: Payer: Self-pay

## 2023-12-03 DIAGNOSIS — Z780 Asymptomatic menopausal state: Secondary | ICD-10-CM

## 2023-12-03 DIAGNOSIS — M810 Age-related osteoporosis without current pathological fracture: Secondary | ICD-10-CM

## 2023-12-03 DIAGNOSIS — Z13820 Encounter for screening for osteoporosis: Secondary | ICD-10-CM

## 2023-12-03 MED ORDER — DENOSUMAB 60 MG/ML ~~LOC~~ SOSY
60.0000 mg | PREFILLED_SYRINGE | SUBCUTANEOUS | Status: DC
  Administered 2024-01-15: 60 mg via SUBCUTANEOUS

## 2023-12-03 NOTE — Telephone Encounter (Signed)
 Sent Prolia  for benefits verification

## 2023-12-03 NOTE — Telephone Encounter (Signed)
 Prolia  VOB initiated via [email or web address]  Next Prolia  inj DUE: 01/10/24

## 2023-12-03 NOTE — Telephone Encounter (Signed)
 SABRA

## 2023-12-03 NOTE — Telephone Encounter (Signed)
 Pt ready for scheduling for PROLIA  on or after : 01/10/24  Option# 1: Buy/Bill (Office supplied medication)  Out-of-pocket cost due at time of clinic visit: $0  Number of injection/visits approved: ---  Primary: MEDICARE Prolia  co-insurance: 0% Admin fee co-insurance: 0%  Secondary: [employer] Prolia  co-insurance:  Admin fee co-insurance:   Medical Benefit Details: Date Benefits were checked: 12/03/23 Deductible: $257 Met of $257 Required/ Coinsurance: 0%/ Admin Fee: 0%  Prior Auth: N/A PA# Expiration Date:   # of doses approved: ----------------------------------------------------------------------- Option# 2- Med Obtained from pharmacy:  Pharmacy benefit: Copay $--- (Paid to pharmacy) Admin Fee: --- (Pay at clinic)  Prior Auth: --- PA# Expiration Date:   # of doses approved:   If patient wants fill through the pharmacy benefit please send prescription to: ---, and include estimated need by date in rx notes. Pharmacy will ship medication directly to the office.  Patient NOT eligible for Prolia  Copay Card. Copay Card can make patient's cost as little as $25. Link to apply: [email or web address]  ** This summary of benefits is an estimation of the patient's out-of-pocket cost. Exact cost may very based on individual plan coverage.

## 2023-12-07 ENCOUNTER — Encounter: Payer: Self-pay | Admitting: Internal Medicine

## 2023-12-07 ENCOUNTER — Ambulatory Visit (INDEPENDENT_AMBULATORY_CARE_PROVIDER_SITE_OTHER): Admitting: Internal Medicine

## 2023-12-07 ENCOUNTER — Telehealth: Payer: Self-pay | Admitting: *Deleted

## 2023-12-07 ENCOUNTER — Other Ambulatory Visit: Payer: Self-pay | Admitting: Cardiology

## 2023-12-07 VITALS — BP 130/74 | HR 83 | Temp 97.8°F | Ht 62.0 in | Wt 177.0 lb

## 2023-12-07 DIAGNOSIS — K921 Melena: Secondary | ICD-10-CM | POA: Diagnosis not present

## 2023-12-07 DIAGNOSIS — I4821 Permanent atrial fibrillation: Secondary | ICD-10-CM

## 2023-12-07 DIAGNOSIS — K625 Hemorrhage of anus and rectum: Secondary | ICD-10-CM

## 2023-12-07 NOTE — Telephone Encounter (Signed)
  Request for patient to stop medication prior to procedure or is needing cleareance  12/07/23  Amber Stephenson 08/09/1945  What type of surgery is being performed? Colonoscopy/EGD  When is surgery scheduled? TBD  What type of clearance is required (medical or pharmacy to hold medication or both? Both  Patient is needing to have cardiac clearance and needing to hold Eliquis  x 2 days prior to procedure.  Name of physician performing surgery?  Dr.Rour[facility] at [employer]: [phone] Fax: [phone]  Anethesia type (none, local, MAC, general)? MAC

## 2023-12-07 NOTE — Progress Notes (Signed)
 Primary Care Physician:  [name], MD Primary Gastroenterologist:  Dr. Shaaron  Pre-Procedure History & Physical: HPI:  Amber Stephenson is a 78 y.o. female here for outpatient follow-up lady with multiple comorbidities including atrial fibrillation on Eliquis , cardiomegaly, very poor exercise tolerance, nonoperable endometrial cancer.  Supposed be on home O2 but states insurance would not pay for it and she is no longer on it.  As far as GI issues are concerned she was occult blood positive earlier this year admitted to [facility] with melena hematochezia and abdominal pain; no significant drop in hemoglobin CT suggested sigmoid diverticulitis treated with antibiotics developed diarrhea GIP/C. difficile negative.  She does have a history of biopsy-proven microscopic colitis on biopsies 2018 and the cecal adenoma.  She is not on budesonide  at this time as her diarrhea has settled down she did have persisting abdominal pain had a repeat CT which showed uncomplicated resolving divericulitis.  She was treated with another course of antibiotics.  Abdominal pain has improved diarrhea not a major problem at this time.  She does not have any nausea or vomiting no dysphagia.  Is not taking a PPI at this time.  She has not seen her cardiologist, Dr. Shlomo, in some time as she reports. I note she had significant hyponatremia 2 months ago with a sodium of 130 also blood sugars elevated in the 300 range.  Updated labs are needed.  Had seen Dr. Burnette in the spring to evaluate Hemoccult positive stool.  After hospitalization she decided to continue with her GI care here.. 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   Benign  paroxysmal positional vertigo due to bilateral vestibular disorder 05/20/2019   Bipolar 1 disorder (HCC) 01/23/2015   with GAD, benzo dependence   CAD (coronary artery disease)    Nonobstructive; Managed by Dr. Charls   Cardiomegaly 01/12/2018   CHF (congestive heart failure) 06/08/2022   Chronic back pain 01/04/2015   Chronic constipation 04/25/2020   Chronic diastolic heart failure (HCC) 05/30/2015   Chronic pain syndrome 08/22/2019   back pain, sacroiliitis   Chronic post-traumatic stress disorder (PTSD) 12/06/2020   Diabetic neuropathy (HCC) 02/06/2016   Dyslipidemia 09/24/2020   Essential hypertension    Functional diarrhea 10/26/2020   Herpes genitalis in women 07/16/2015   History of adenomatous polyp of colon 05/21/2019   Overview:   03/31/17: Colonoscopy: nonadvanced adenoma, microscopic colitis, f/u 5 yrs, [facility]/GAP   History of radiation therapy    Endometrial- 02/18/23-03/31/23-Dr. Lynwood Kinard   Hypotension 09/01/2022   Insomnia 01/23/2015   Metabolic acidosis 01/12/2023   Migraine headache with aura 02/12/2016   Myofascial pain dysfunction syndrome 08/22/2019   Non-alcoholic fatty liver disease 01/12/2018   OSA (obstructive sleep apnea) 02/24/2019   10/09/2018 - HST  - AHI 40.6    Osteopenia 12/06/2020   Rx alendronate  35 mg.   Pinguecula 12/06/2020   Presbyopia 12/06/2020   Pulmonary hypertension    RLS (restless legs syndrome) 04/27/2015   RSV (respiratory syncytial virus infection) 06/10/2023   Tremor, essential 12/11/2021   Type II diabetes mellitus (HCC) 09/24/2020    Past Surgical History:  Procedure Laterality Date   BREAST REDUCTION SURGERY     COLONOSCOPY  2018  6 mm cecal tubular adenoma, sigmoid diverticulosis, random colon biopsies consistent with microscopic colitis   EYE SURGERY Right    cateracts   HAMMER TOE SURGERY     LEFT HEART CATH AND CORONARY ANGIOGRAPHY N/A 02/03/2018   Procedure: LEFT HEART CATH AND CORONARY ANGIOGRAPHY;   Surgeon: [name], Peter M, MD;  Location: [facility] INVASIVE CV LAB;  Service: Cardiovascular;  Laterality: N/A;   REVERSE SHOULDER ARTHROPLASTY Right 08/19/2019   Procedure: REVERSE SHOULDER ARTHROPLASTY;  Surgeon: Kay Kemps, MD;  Location: [facility] ORS;  Service: Orthopedics;  Laterality: Right;  interscalene block   SHOULDER SURGERY Right    I BROKE MY SHOUDLER   THIGH SURGERY     TO REMOVE A TUMOR     Prior to Admission medications   Medication Sig Start Date End Date Taking? Authorizing Provider  ANUCORT-HC  25 MG suppository Place 25 mg rectally daily. 09/28/23   [provider]  ARIPiprazole  (ABILIFY ) 2 MG tablet Take 2 mg by mouth at bedtime. 10/07/21   [provider]  atorvastatin  (LIPITOR) 80 MG tablet Take 1 tablet (80 mg total) by mouth daily. 02/05/23 12/01/23  Lucien Orren SAILOR, PA-C  BELSOMRA  20 MG TABS Take 1 tablet by mouth at bedtime as needed. 10/16/23   [provider]  carbidopa -levodopa  (SINEMET ) 10-100 MG tablet Take 1 tablet by mouth 4 (four) times daily. For restless leg syndrome 12/01/23   [name], MD  ciprofloxacin -hydrocortisone  (CIPRO  HC OTIC) OTIC suspension Place 3 drops into both ears 2 (two) times daily. For one week 12/01/23   [name], MD  colchicine  0.6 MG tablet Take 1 tablet (0.6 mg total) by mouth 2 (two) times daily. For mouth sores 10/21/23   [name], MD  Continuous Glucose Sensor (DEXCOM G7 SENSOR) MISC CHANGE SENSOR EVERY 10 DAYS 09/28/23   Shamleffer, Donell Cardinal, MD  cyanocobalamin  (VITAMIN B12) 1000 MCG tablet Take 1 tablet (1,000 mcg total) by mouth daily. 09/04/22   Ghimire, Donalda HERO, MD  denosumab  (PROLIA ) 60 MG/ML SOSY injection Inject 60 mg into the skin every 6 (six) months. 08/14/20   [name], MD  diphenoxylate -atropine  (LOMOTIL ) 2.5-0.025 MG tablet TAKE 2 TABLETS BY MOUTH FOUR TIMES DAILY AS NEEDED FOR DIARRHEA OR LOOSE STOOLS 11/24/23   [name], MD  ELIQUIS  5 MG TABS tablet TAKE 1 TABLET TWICE A DAY  11/02/23   Shlomo Wilbert SAUNDERS, MD  escitalopram  (LEXAPRO ) 10 MG tablet Take 10 mg by mouth daily.    [provider]  fluticasone -salmeterol (ADVAIR DISKUS) 500-50 MCG/ACT AEPB Inhale 1 puff into the lungs in the morning and at bedtime. 10/06/23   [name], MD  furosemide  (LASIX ) 40 MG tablet Take 40 mg by mouth daily. 09/18/23   [provider]  hydrOXYzine  (VISTARIL ) 25 MG capsule Take 1 capsule (25 mg total) by mouth 3 (three) times daily. 12/01/23   [name], MD  insulin  glargine (LANTUS  SOLOSTAR) 100 UNIT/ML Solostar Pen Inject 30 Units into the skin 2 (two) times daily. 09/21/23   Shamleffer, Ibtehal Jaralla, MD  insulin  lispro (HUMALOG  KWIKPEN) 100 UNIT/ML KwikPen Max daily 45 units 05/22/23   Shamleffer, Ibtehal Jaralla, MD  Insulin  Pen Needle 29G X MISC 1 Device by Does not apply route daily in the afternoon. 05/22/23   Shamleffer, Ibtehal Jaralla, MD  LORazepam  (ATIVAN ) 0.5 MG tablet Take 0.5-1 mg by mouth in the morning, at noon, and at bedtime. Take 0.5 mg by mouth in the morning, 0.5 mg at lunch, and 0.5 mg at  bedtime    [provider]  losartan  (COZAAR ) 25 MG tablet Take 25 mg by mouth daily. 02/14/23   [provider]  MAGNESIUM  PO Take 1 tablet by mouth daily.    [provider]  meclizine  (ANTIVERT ) 25 MG tablet Take 1 tablet (25 mg total) by mouth 3 (three) times daily as needed for dizziness. 06/05/23   Dettinger, Fonda LABOR, MD  metFORMIN  (GLUCOPHAGE -XR) 500 MG 24 hr tablet Take 2 tablets (1,000 mg total) by mouth daily with breakfast. 05/22/23   Shamleffer, Donell Cardinal, MD  Methocarbamol  1000 MG TABS Take 1,000 mg by mouth every 6 (six) hours as needed. 08/31/23   Lovorn, Megan, MD  metoprolol  tartrate (LOPRESSOR ) 50 MG tablet Take 1 tablet (50 mg total) by mouth 2 (two) times daily. 01/06/23   Shlomo Wilbert SAUNDERS, MD  naloxone  (NARCAN ) nasal spray 4 mg/0.1 mL Place 1 spray into the nose daily as needed (for over dose). 08/08/21    [provider]  nystatin  (MYCOSTATIN /NYSTOP ) powder Apply 1 Application topically as needed (rash). 06/16/22   Shamleffer, Ibtehal Jaralla, MD  nystatin  ointment (MYCOSTATIN ) Apply 1 Application topically 2 (two) times daily as needed (for rashes). 04/06/23   Signa Delon LABOR, NP  omeprazole  (PRILOSEC) 40 MG capsule Take 1 capsule (40 mg total) by mouth 2 (two) times daily before a meal. 10/23/23   Rudy Josette RAMAN, PA-C  ondansetron  (ZOFRAN ) 4 MG tablet Take 1 tablet (4 mg total) by mouth every 8 (eight) hours as needed for nausea or vomiting. For cancer related nausea 10/23/23   Rudy Josette RAMAN, PA-C  OXcarbazepine  (TRILEPTAL ) 150 MG tablet Take 1 tablet (150 mg total) by mouth 2 (two) times daily. 06/13/22   Sherrill Cable Latif, DO  oxyCODONE  (OXYCONTIN ) 20 mg 12 hr tablet Take 1 tablet (20 mg total) by mouth every 12 (twelve) hours. 10/29/23   Debby Fidela CROME, NP  potassium chloride  (KLOR-CON ) 10 MEQ tablet Take 1 tablet (10 mEq total) by mouth daily. 02/05/23 12/01/23  Lucien Orren SAILOR, PA-C  pregabalin  (LYRICA ) 200 MG capsule Take 200 mg by mouth 2 (two) times daily.    [provider]  tirzepatide  (MOUNJARO ) 12.5 MG/0.5ML Pen Inject 12.5 mg into the skin once a week. 09/21/23   Shamleffer, Donell Cardinal, MD  Vitamin D , Cholecalciferol , 25 MCG (1000 UT) TABS Take 1,000 Units by mouth daily in the afternoon. 07/26/20   [provider]    Allergies as of 12/07/2023 - Review Complete 12/07/2023  Allergen Reaction Noted   Iodine Anaphylaxis 06/28/2003   Ivp dye [iodinated contrast media] Anaphylaxis, Swelling, and Other (See Comments) 12/13/2014   Latex Other (See Comments) 09/29/2018   Tape Other (See Comments) 01/28/2023   Tizanidine  Other (See Comments) 03/10/2022    Family History  Problem Relation Age of Onset   Diabetes Mother    Heart disease Mother    Alzheimer's disease Father    Heart disease Sister        CABG   Diabetes Sister    Alcohol  abuse Sister     Stroke Brother    Heart disease Brother    Mental illness Brother    Diabetes Brother    Breast cancer Neg Hx    Ovarian cancer Neg Hx    Colon cancer Neg Hx    Endometrial cancer Neg Hx     Social History   Socioeconomic History   Marital status: Married    Spouse name: Vaughan   Number of children: 3  Years of education: 15   Highest education level: Associate degree: academic program  Occupational History   Occupation: Retired    Comment: Chief Financial Officer  Tobacco Use   Smoking status: Never   Smokeless tobacco: Never  Vaping Use   Vaping status: Never Used  Substance and Sexual Activity   Alcohol  use: Not Currently    Alcohol /week: 0.0 standard drinks of alcohol    Drug use: No   Sexual activity: Yes    Partners: Male    Birth control/protection: Post-menopausal  Other Topics Concern   Not on file  Social History Narrative   Lives at home with husband.    They have 3 children who live away - [place] , [place] , and [place] .   She is from [place]  and most of her family lives there.   Her husbands's family live nearby   Right handed.   Social Drivers of Corporate investment banker Strain: Low Risk  (08/13/2023)   Overall Financial Resource Strain (CARDIA)    Difficulty of Paying Living Expenses: Not hard at all  Food Insecurity: No Food Insecurity (10/12/2023)   Hunger Vital Sign    Worried About Running Out of Food in the Last Year: Never true    Ran Out of Food in the Last Year: Never true  Transportation Needs: No Transportation Needs (10/12/2023)   PRAPARE - Administrator, Civil Service (Medical): No    Lack of Transportation (Non-Medical): No  Physical Activity: Insufficiently Active (08/13/2023)   Exercise Vital Sign    Days of Exercise per Week: 3 days    Minutes of Exercise per Session: 20 min  Stress: No Stress Concern Present (08/13/2023)   [employer] of Occupational Health - Occupational Stress Questionnaire    Feeling of Stress :  Only a little  Social Connections: Moderately Integrated (10/07/2023)   Social Connection and Isolation Panel    Frequency of Communication with Friends and Family: More than three times a week    Frequency of Social Gatherings with Friends and Family: Once a week    Attends Religious Services: More than 4 times per year    Active Member of [employer] or Organizations: No    Attends Banker Meetings: Never    Marital Status: Married  Catering manager Violence: Not At Risk (10/12/2023)   Humiliation, Afraid, Rape, and Kick questionnaire    Fear of Current or Ex-Partner: No    Emotionally Abused: No    Physically Abused: No    Sexually Abused: No    Review of Systems: See HPI, otherwise negative ROS  Physical Exam: BP (!) 143/97 (BP Location: Right Arm, Patient Position: Sitting, Cuff Size: Normal)   Pulse 87   Temp 97.8 F (36.6 C) (Oral)   Ht 5' 2 (1.575 m)   Wt 177 lb (80.3 kg)   SpO2 95%   BMI 32.37 kg/m  General:   Alert,  pleasant and cooperative in NAD; mildly dyspneic at rest. Lungs:  Clear throughout to auscultation.   No wheezes, crackles, or rhonchi. No acute distress. Heart:  Regular rate and rhythm; no murmurs, clicks, rubs,  or gallops. Abdomen: Non-distended, normal bowel sounds.  Soft and nontender without appreciable mass or hepatosplenomegaly.  Pulses:  Normal pulses noted. Extremities:  Without clubbing or edema.  Impression/Plan: 78 year old lady with multiple comorbidities including atrial fibrillation, cardiomyopathy on Eliquis  recently evaluated for melena and hematochezia.  Abnormal sigmoid colon suggestive of inflammation.  Treated x 2 for diverticulitis.  Clinically improved.  Plan from her hospitalization was to resume Eliquis  and plan for bidirectional endoscopy with EGD and colonoscopy.  Clinically, I do not see her fit for any endoscopic evaluation with propofol  at this time.  She is in need to see her cardiologist in the near  future.  Recommendations:  *See Dr. Shlomo as soon as possible.    *At this point, we will tentatively get her a slot for next month EGD and colonoscopy ASA 3/4.  *Need updated c-Met and CBC now  *Further recommendations to follow.     Notice: This dictation was prepared with Dragon dictation along with smaller phrase technology. Any transcriptional errors that result from this process are unintentional and may not be corrected upon review.

## 2023-12-07 NOTE — Patient Instructions (Signed)
 It was good to see you again today!  Glad to see your abdominal pain is improved.  Our goal is to perform an upper endoscopy as well as a colonoscopy to evaluate your bleeding and the abnormality in your colon on CT  Prior to proceeding with endoscopic evaluation, you need to see Dr. Shlomo to assess your risk for the procedures.  Eliquis  will need to be held for 2 days.  No change in your medication regimen for the time being.  Will update labs and tentatively get you on the schedule for next month  Need a c-Met and a CBC  Further recommendations to follow.

## 2023-12-08 ENCOUNTER — Encounter: Payer: Self-pay | Admitting: Family Medicine

## 2023-12-09 ENCOUNTER — Other Ambulatory Visit: Payer: Self-pay | Admitting: Family Medicine

## 2023-12-09 ENCOUNTER — Telehealth: Payer: Self-pay

## 2023-12-09 ENCOUNTER — Telehealth: Payer: Self-pay | Admitting: Cardiology

## 2023-12-09 DIAGNOSIS — M810 Age-related osteoporosis without current pathological fracture: Secondary | ICD-10-CM

## 2023-12-09 NOTE — Telephone Encounter (Signed)
   Patient Name: Amber Stephenson  DOB: 05-17-1946 MRN: [id number]  Primary Cardiologist: Wilbert Bihari, MD  Chart reviewed as part of pre-operative protocol coverage.   Simple dental extractions (i.e. 1-2 teeth) are considered low risk procedures per guidelines and generally do not require any specific cardiac clearance. It is also generally accepted that for simple extractions and dental cleanings, there is no need to interrupt blood thinner therapy.  SBE prophylaxis is not required for the patient from a cardiac standpoint.  I will route this recommendation to the requesting party via Epic fax function and remove from pre-op pool.  Please call with questions.  Damien JAYSON Braver, NP 12/09/2023, 2:01 PM

## 2023-12-09 NOTE — Telephone Encounter (Addendum)
 Amber Stephenson is requesting medication to help her to sleep better. Per patient she has asked her other providers for help. She was told to call you to discuss the medication options.   [name] would like a call back if possible. [phone]).  ([facility] on [address] in [place])

## 2023-12-09 NOTE — Telephone Encounter (Signed)
   Pre-operative Risk Assessment    Patient Name: Amber Stephenson  DOB: [birth year] MRN: [id number]   Date of last office visit: 08/27/23 Date of next office visit: Not yet scheduled  Request for Surgical Clearance    Procedure:  Dental Extraction - Amount of Teeth to be Pulled:  2  Date of Surgery:  Clearance 12/09/23                                Surgeon:  Dr. Diedra Camp Surgeon's Group or Practice Name:  [facility] Phone number:  [phone]  Fax number:  [phone]   Type of Clearance Requested:   - Medical  - Pharmacy:  Hold        Type of Anesthesia:  Local    Additional requests/questions:  [name]) stated patient is in the dental chair now and is having an extraction.   Signed, Jasmin B Wilson   12/09/2023, 1:51 PM

## 2023-12-09 NOTE — Telephone Encounter (Signed)
 Patient is coming due for her Prolia  injection - reviewed labs and calcium  level is low and vitamin D  has not been checked in the last year. Please review and advise if okay to proceed with the injection in August

## 2023-12-09 NOTE — Telephone Encounter (Signed)
 Okay to get, but she does need to get vitamin D  lab Ordered so she can get it when she comes in for prolia .  It's okay to go ahead and give then we can assess for follow up

## 2023-12-10 ENCOUNTER — Telehealth: Payer: Self-pay

## 2023-12-10 NOTE — Telephone Encounter (Signed)
 Getting along as best as possible-  However cannot sleep Even Belsomra  hasn't helped I educated her that her PCP or more importantly, maybe Psychiatry would be best to help with sleep, but I usually only prescribe the basics, which are old type medicines- but nothing that's new/controlled- I suggest she call Psychiatry to try and get more sleep.   She voiced understanding

## 2023-12-10 NOTE — Telephone Encounter (Signed)
 Med Rec and Consent done     Patient Consent for Virtual Visit        Amber Stephenson has provided verbal consent on 12/10/2023 for a virtual visit (video or telephone).   CONSENT FOR VIRTUAL VISIT FOR:  Amber Stephenson  By participating in this virtual visit I agree to the following:  I hereby voluntarily request, consent and authorize [facility] and its employed or contracted physicians, physician assistants, nurse practitioners or other licensed health care professionals (the Practitioner), to provide me with telemedicine health care services (the "Services) as deemed necessary by the treating Practitioner. I acknowledge and consent to receive the Services by the Practitioner via telemedicine. I understand that the telemedicine visit will involve communicating with the Practitioner through live audiovisual communication technology and the disclosure of certain medical information by electronic transmission. I acknowledge that I have been given the opportunity to request an in-person assessment or other available alternative prior to the telemedicine visit and am voluntarily participating in the telemedicine visit.  I understand that I have the right to withhold or withdraw my consent to the use of telemedicine in the course of my care at any time, without affecting my right to future care or treatment, and that the Practitioner or I may terminate the telemedicine visit at any time. I understand that I have the right to inspect all information obtained and/or recorded in the course of the telemedicine visit and may receive copies of available information for a reasonable fee.  I understand that some of the potential risks of receiving the Services via telemedicine include:  Delay or interruption in medical evaluation due to technological equipment failure or disruption; Information transmitted may not be sufficient (e.g. poor resolution of images) to allow for appropriate medical  decision making by the Practitioner; and/or  In rare instances, security protocols could fail, causing a breach of personal health information.  Furthermore, I acknowledge that it is my responsibility to provide information about my medical history, conditions and care that is complete and accurate to the best of my ability. I acknowledge that Practitioner's advice, recommendations, and/or decision may be based on factors not within their control, such as incomplete or inaccurate data provided by me or distortions of diagnostic images or specimens that may result from electronic transmissions. I understand that the practice of medicine is not an exact science and that Practitioner makes no warranties or guarantees regarding treatment outcomes. I acknowledge that a copy of this consent can be made available to me via my patient portal [facility] MyChart), or I can request a printed copy by calling the office of [facility].    I understand that my insurance will be billed for this visit.   I have read or had this consent read to me. I understand the contents of this consent, which adequately explains the benefits and risks of the Services being provided via telemedicine.  I have been provided ample opportunity to ask questions regarding this consent and the Services and have had my questions answered to my satisfaction. I give my informed consent for the services to be provided through the use of telemedicine in my medical care

## 2023-12-10 NOTE — Telephone Encounter (Signed)
 Patient with diagnosis of Afib on Eliquis  for anticoagulation.    Procedure: Colonoscopy/EGD Date of procedure: TBD   CHA2DS2-VASc Score = 7   This indicates a 11.2% annual risk of stroke. The patient's score is based upon: CHF History: 1 HTN History: 1 Diabetes History: 1 Stroke History: 0 Vascular Disease History: 1 Age Score: 2 Gender Score: 1     CrCl 71 mL/min Platelet count 254  Per office protocol, patient can hold Eliquis  for 2 days prior to procedure.   Patient will not need bridging with Lovenox  (enoxaparin ) around procedure.  **This guidance is not considered finalized until pre-operative APP has relayed final recommendations.**

## 2023-12-10 NOTE — Telephone Encounter (Signed)
   Name: Amber Stephenson  DOB: 07/28/1945  MRN: [id number]  Primary Cardiologist: Wilbert Bihari, MD Last OV: 08/27/23  Preoperative team, please contact this patient and set up a phone call appointment for further preoperative risk assessment. Please obtain consent and complete medication review. Thank you for your help.  Patient was last seen in office by Dr. Bihari on 08/27/2023, she underwent cardiac PET stress that was normal and low risk.  I confirm that guidance regarding antiplatelet and oral anticoagulation therapy has been completed and, if necessary, noted below.  Per office protocol, patient can hold Eliquis  for 2 days prior to procedure.   Patient will not need bridging with Lovenox  (enoxaparin ) around procedure.  I also confirmed the patient resides in the state of [place] . As per [facility] telemedicine laws, the patient must reside in the state in which the provider is licensed.  Phelan Schadt D Smith Potenza, NP 12/10/2023, 12:32 PM [facility]

## 2023-12-10 NOTE — Telephone Encounter (Signed)
 S/W pt and scheduled TELE Preop appt 12/16/23. Med Rec and Consent done

## 2023-12-16 ENCOUNTER — Ambulatory Visit: Admitting: Family Medicine

## 2023-12-16 ENCOUNTER — Encounter: Payer: Self-pay | Admitting: Family Medicine

## 2023-12-16 ENCOUNTER — Ambulatory Visit

## 2023-12-16 ENCOUNTER — Ambulatory Visit (INDEPENDENT_AMBULATORY_CARE_PROVIDER_SITE_OTHER): Admitting: Family Medicine

## 2023-12-16 ENCOUNTER — Ambulatory Visit: Payer: Self-pay

## 2023-12-16 VITALS — BP 142/82 | HR 90 | Temp 97.5°F | Ht 62.0 in | Wt 170.0 lb

## 2023-12-16 DIAGNOSIS — N12 Tubulo-interstitial nephritis, not specified as acute or chronic: Secondary | ICD-10-CM

## 2023-12-16 DIAGNOSIS — R300 Dysuria: Secondary | ICD-10-CM

## 2023-12-16 DIAGNOSIS — Z794 Long term (current) use of insulin: Secondary | ICD-10-CM

## 2023-12-16 DIAGNOSIS — E1165 Type 2 diabetes mellitus with hyperglycemia: Secondary | ICD-10-CM | POA: Diagnosis not present

## 2023-12-16 MED ORDER — CEFTRIAXONE SODIUM 1 G IJ SOLR
1.0000 g | Freq: Once | INTRAMUSCULAR | Status: AC
  Administered 2023-12-16: 1 g via INTRAMUSCULAR

## 2023-12-16 MED ORDER — CIPROFLOXACIN HCL 500 MG PO TABS: 500.0000 mg | ORAL_TABLET | Freq: Two times a day (BID) | ORAL | 0 refills | Status: DC

## 2023-12-16 NOTE — Progress Notes (Signed)
 Subjective:  Patient ID: Amber Stephenson, female    DOB: April 10, 1946  Age: 78 y.o. MRN: [id number]  CC: Dysuria (Urinary frequency, fatigue, pain and burning with urination x 4 days)   HPI Amber Stephenson presents for burning with urination and frequency for several days. Denies fever . Bilateral flank pain. No nausea, vomiting.Onset 4 days ago.       12/18/2023    2:17 PM 12/16/2023    4:33 PM 12/16/2023    3:28 PM  Depression screen PHQ 2/9  Decreased Interest 2 3 0  Down, Depressed, Hopeless 2 0 0  PHQ - 2 Score 4 3 0  Altered sleeping 3 2   Tired, decreased energy 3 3   Change in appetite 3 3   Feeling bad or failure about yourself  3 3   Trouble concentrating 3 2   Moving slowly or fidgety/restless 3 2   Suicidal thoughts 0 0   PHQ-9 Score 22 18   Difficult doing work/chores Extremely dIfficult Extremely dIfficult     History Amber Stephenson has a past medical history of Allergic rhinitis (02/24/2019), Ambulatory dysfunction (04/23/2020), Atrial fibrillation with RVR (HCC) (05/19/2017), Back pain/sacroiliitis--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), Benign paroxysmal positional vertigo due to bilateral vestibular disorder (05/20/2019), Bipolar 1 disorder (HCC) (01/23/2015), CAD (coronary artery disease), Cardiomegaly (01/12/2018), CHF (congestive heart failure) (06/08/2022), Chronic back pain (01/04/2015), Chronic constipation (04/25/2020), Chronic diastolic heart failure (HCC) (87/71/7983), Chronic pain syndrome (08/22/2019), Chronic post-traumatic stress disorder (PTSD) (12/06/2020), Diabetic neuropathy (HCC) (02/06/2016), Dyslipidemia (09/24/2020), Essential hypertension, Functional diarrhea (10/26/2020), Herpes genitalis in women (07/16/2015), History of adenomatous polyp of colon (05/21/2019), History of radiation therapy, Hypotension (09/01/2022), Insomnia (01/23/2015), Metabolic acidosis (01/12/2023), Migraine headache with  aura (02/12/2016), Myofascial pain dysfunction syndrome (08/22/2019), Non-alcoholic fatty liver disease (91/86/7980), OSA (obstructive sleep apnea) (02/24/2019), Osteopenia (12/06/2020), Pinguecula (12/06/2020), Presbyopia (12/06/2020), Pulmonary hypertension, RLS (restless legs syndrome) (04/27/2015), RSV (respiratory syncytial virus infection) (06/10/2023), Tremor, essential (12/11/2021), and Type II diabetes mellitus (HCC) (09/24/2020).   She has a past surgical history that includes THIGH SURGERY; Shoulder surgery (Right); Breast reduction surgery; Eye surgery (Right); Hammer toe surgery; LEFT HEART CATH AND CORONARY ANGIOGRAPHY (N/A, 02/03/2018); Reverse shoulder arthroplasty (Right, 08/19/2019); and Colonoscopy (2018).   Her family history includes Alcohol  abuse in her sister; Alzheimer's disease in her father; Diabetes in her brother, mother, and sister; Heart disease in her brother, mother, and sister; Mental illness in her brother; Stroke in her brother.She reports that she has never smoked. She has never used smokeless tobacco. She reports that she does not currently use alcohol . She reports that she does not use drugs.    ROS Review of Systems  Constitutional:  Negative for chills, diaphoresis and fever.  HENT:  Negative for congestion.   Eyes:  Negative for visual disturbance.  Respiratory:  Negative for cough and shortness of breath.   Cardiovascular:  Negative for chest pain and palpitations.  Gastrointestinal:  Negative for constipation, diarrhea and nausea.  Genitourinary:  Positive for dysuria, frequency and urgency. Negative for decreased urine volume, flank pain, hematuria, menstrual problem and pelvic pain.  Musculoskeletal:  Negative for arthralgias and joint swelling.  Skin:  Negative for rash.  Neurological:  Negative for dizziness and numbness.    Objective:  BP (!) 142/82   Pulse 90   Temp (!) 97.5 F (36.4 C)   Ht 5' 2 (1.575 m)   Wt 170 lb (77.1 kg) Comment:  husband reported  SpO2 98%   BMI 31.09 kg/m   BP Readings from Last 3 Encounters:  12/18/23 (!) 140/90  12/16/23 (!) 142/82  12/07/23 130/74    Wt Readings from Last 3 Encounters:  12/18/23 170 lb (77.1 kg)  12/16/23 170 lb (77.1 kg)  12/07/23 177 lb (80.3 kg)     Physical Exam Constitutional:      Appearance: She is well-developed.  HENT:     Head: Normocephalic and atraumatic.  Cardiovascular:     Rate and Rhythm: Normal rate and regular rhythm.     Heart sounds: No murmur heard. Pulmonary:     Effort: Pulmonary effort is normal.     Breath sounds: Normal breath sounds.  Abdominal:     General: Bowel sounds are normal.     Palpations: Abdomen is soft. There is no mass.     Tenderness: There is no abdominal tenderness. There is no guarding or rebound.  Musculoskeletal:        General: No tenderness.  Skin:    General: Skin is warm and dry.  Neurological:     Mental Status: She is alert and oriented to person, place, and time.  Psychiatric:        Behavior: Behavior normal.      Assessment & Plan:  Dysuria -     Urine Culture -     Urinalysis -     Urine Microscopic  Type 2 diabetes mellitus with hyperglycemia, with long-term current use of insulin  (HCC) -     Microalbumin / creatinine urine ratio  Pyelonephritis -     cefTRIAXone  Sodium  Other orders -     Ciprofloxacin  HCl; Take 1 tablet (500 mg total) by mouth 2 (two) times daily.  Dispense: 20 tablet; Refill: 0     Follow-up: No follow-ups on file.  Butler Der, M.D.

## 2023-12-16 NOTE — Telephone Encounter (Signed)
 E2C2 scheduled appointment.

## 2023-12-16 NOTE — Telephone Encounter (Signed)
 FYI Only or Action Required?: FYI only for provider.  Patient was last seen in primary care on 12/01/2023 by Amber Lowers, MD.  Called Nurse Triage reporting Dysuria.  Symptoms began several days ago.  Interventions attempted: Nothing.  Symptoms are: gradually worsening.  Triage Disposition: See Physician Within 24 Hours  Patient/caregiver understands and will follow disposition?: Yes        Copied from CRM [id number]. Topic: Clinical - Red Word Triage >> Dec 16, 2023  8:29 AM Heather  M wrote: Red Word that prompted transfer to Nurse Triage: States rapid, intense, urination off and on. Onset a couple days ago. Pain and discomfort when going to the bathroom (urinating). Worsening by the day. Wants advice/to be seen Reason for Disposition  All other patients with painful urination  (Exception: [1] EITHER frequency or urgency AND [2] has on-call doctor.)  Answer Assessment - Initial Assessment Questions 1. SEVERITY: How bad is the pain?  (e.g., Scale 1-10; mild, moderate, or severe)     6/10 2. FREQUENCY: How many times have you had painful urination today?      All night 3. PATTERN: Is pain present every time you urinate or just sometimes?      Every time 4. ONSET: When did the painful urination start?      3 days  5. FEVER: Do you have a fever? If Yes, ask: What is your temperature, how was it measured, and when did it start?     no 6. PAST UTI: Have you had a urine infection before? If Yes, ask: When was the last time? and What happened that time?      Yes, a month ago  7. CAUSE: What do you think is causing the painful urination?  (e.g., UTI, scratch, Herpes sore)     uti 8. OTHER SYMPTOMS: Do you have any other symptoms? (e.g., blood in urine, flank pain, genital sores, urgency, vaginal discharge)     frequency  Protocols used: Urination Pain - Female-A-AH

## 2023-12-16 NOTE — Telephone Encounter (Signed)
 Attempted to call patient for TeleVisit x5 attempts today with no answer. Attempted both numbers on file and voicemail box was not set up. Patient will need to be rescheduled.

## 2023-12-16 NOTE — Telephone Encounter (Signed)
 Tried calling patient to reschedule televisit no answer wasn't able to leave vm will try again at a later time

## 2023-12-17 LAB — URINALYSIS
Bilirubin, UA: NEGATIVE
Glucose, UA: NEGATIVE
Ketones, UA: NEGATIVE
Leukocytes,UA: NEGATIVE
Nitrite, UA: NEGATIVE
Specific Gravity, UA: 1.02 (ref 1.005–1.030)
Urobilinogen, Ur: 0.2 mg/dL (ref 0.2–1.0)
pH, UA: 6 (ref 5.0–7.5)

## 2023-12-17 LAB — MICROALBUMIN / CREATININE URINE RATIO
Creatinine, Urine: 131.1 mg/dL
Microalb/Creat Ratio: 447 mg/g{creat} — ABNORMAL HIGH (ref 0–29)
Microalbumin, Urine: 585.5 ug/mL

## 2023-12-17 NOTE — Telephone Encounter (Signed)
 2nd attempt : Tried calling patient again to see about rescheduling telehealth visit, Na, no VM box to leave a message. I will also call requesting office to see if they have been in touch with patient.

## 2023-12-18 ENCOUNTER — Emergency Department (HOSPITAL_COMMUNITY)

## 2023-12-18 ENCOUNTER — Other Ambulatory Visit: Payer: Self-pay

## 2023-12-18 ENCOUNTER — Emergency Department (HOSPITAL_COMMUNITY)
Admission: EM | Admit: 2023-12-18 | Discharge: 2023-12-18 | Disposition: A | Discharge status: Home / Self Care | Attending: Emergency Medicine | Admitting: Emergency Medicine

## 2023-12-18 ENCOUNTER — Other Ambulatory Visit: Payer: Self-pay | Admitting: *Deleted

## 2023-12-18 DIAGNOSIS — S0990XA Unspecified injury of head, initial encounter: Secondary | ICD-10-CM | POA: Diagnosis not present

## 2023-12-18 DIAGNOSIS — Z7901 Long term (current) use of anticoagulants: Secondary | ICD-10-CM | POA: Diagnosis not present

## 2023-12-18 DIAGNOSIS — M549 Dorsalgia, unspecified: Secondary | ICD-10-CM | POA: Insufficient documentation

## 2023-12-18 DIAGNOSIS — W01198A Fall on same level from slipping, tripping and stumbling with subsequent striking against other object, initial encounter: Secondary | ICD-10-CM | POA: Diagnosis not present

## 2023-12-18 DIAGNOSIS — M542 Cervicalgia: Secondary | ICD-10-CM | POA: Diagnosis not present

## 2023-12-18 DIAGNOSIS — M5030 Other cervical disc degeneration, unspecified cervical region: Secondary | ICD-10-CM | POA: Diagnosis not present

## 2023-12-18 DIAGNOSIS — K5750 Diverticulosis of both small and large intestine without perforation or abscess without bleeding: Secondary | ICD-10-CM | POA: Diagnosis not present

## 2023-12-18 DIAGNOSIS — I4891 Unspecified atrial fibrillation: Secondary | ICD-10-CM | POA: Diagnosis not present

## 2023-12-18 DIAGNOSIS — W19XXXA Unspecified fall, initial encounter: Secondary | ICD-10-CM

## 2023-12-18 DIAGNOSIS — M160 Bilateral primary osteoarthritis of hip: Secondary | ICD-10-CM | POA: Diagnosis not present

## 2023-12-18 DIAGNOSIS — M25551 Pain in right hip: Secondary | ICD-10-CM | POA: Insufficient documentation

## 2023-12-18 DIAGNOSIS — R109 Unspecified abdominal pain: Secondary | ICD-10-CM | POA: Diagnosis not present

## 2023-12-18 DIAGNOSIS — Z96611 Presence of right artificial shoulder joint: Secondary | ICD-10-CM | POA: Diagnosis not present

## 2023-12-18 DIAGNOSIS — S199XXA Unspecified injury of neck, initial encounter: Secondary | ICD-10-CM | POA: Diagnosis not present

## 2023-12-18 DIAGNOSIS — K5792 Diverticulitis of intestine, part unspecified, without perforation or abscess without bleeding: Secondary | ICD-10-CM | POA: Diagnosis not present

## 2023-12-18 DIAGNOSIS — M25552 Pain in left hip: Secondary | ICD-10-CM | POA: Diagnosis not present

## 2023-12-18 DIAGNOSIS — M546 Pain in thoracic spine: Secondary | ICD-10-CM | POA: Diagnosis not present

## 2023-12-18 DIAGNOSIS — Z91040 Latex allergy status: Secondary | ICD-10-CM | POA: Insufficient documentation

## 2023-12-18 DIAGNOSIS — I517 Cardiomegaly: Secondary | ICD-10-CM | POA: Diagnosis not present

## 2023-12-18 DIAGNOSIS — I959 Hypotension, unspecified: Secondary | ICD-10-CM | POA: Diagnosis not present

## 2023-12-18 DIAGNOSIS — M4802 Spinal stenosis, cervical region: Secondary | ICD-10-CM | POA: Diagnosis not present

## 2023-12-18 DIAGNOSIS — Z794 Long term (current) use of insulin: Secondary | ICD-10-CM | POA: Diagnosis not present

## 2023-12-18 DIAGNOSIS — K625 Hemorrhage of anus and rectum: Secondary | ICD-10-CM | POA: Diagnosis not present

## 2023-12-18 DIAGNOSIS — Z981 Arthrodesis status: Secondary | ICD-10-CM | POA: Diagnosis not present

## 2023-12-18 DIAGNOSIS — Z043 Encounter for examination and observation following other accident: Secondary | ICD-10-CM | POA: Diagnosis not present

## 2023-12-18 DIAGNOSIS — R918 Other nonspecific abnormal finding of lung field: Secondary | ICD-10-CM | POA: Diagnosis not present

## 2023-12-18 DIAGNOSIS — I672 Cerebral atherosclerosis: Secondary | ICD-10-CM | POA: Diagnosis not present

## 2023-12-18 DIAGNOSIS — K4090 Unilateral inguinal hernia, without obstruction or gangrene, not specified as recurrent: Secondary | ICD-10-CM | POA: Diagnosis not present

## 2023-12-18 LAB — URINALYSIS, W/ REFLEX TO CULTURE (INFECTION SUSPECTED)
Bacteria, UA: NONE SEEN
Bilirubin Urine: NEGATIVE
Glucose, UA: NEGATIVE mg/dL
Hgb urine dipstick: NEGATIVE
Ketones, ur: NEGATIVE mg/dL
Leukocytes,Ua: NEGATIVE
Nitrite: NEGATIVE
Protein, ur: 30 mg/dL — AB
Specific Gravity, Urine: 1.009 (ref 1.005–1.030)
pH: 7 (ref 5.0–8.0)

## 2023-12-18 LAB — COMPREHENSIVE METABOLIC PANEL WITH GFR
ALT: 11 U/L (ref 0–44)
AST: 24 U/L (ref 15–41)
Albumin: 3.5 g/dL (ref 3.5–5.0)
Alkaline Phosphatase: 88 U/L (ref 38–126)
Anion gap: 13 (ref 5–15)
BUN: 10 mg/dL (ref 8–23)
CO2: 23 mmol/L (ref 22–32)
Calcium: 9.2 mg/dL (ref 8.9–10.3)
Chloride: 95 mmol/L — ABNORMAL LOW (ref 98–111)
Creatinine, Ser: 0.57 mg/dL (ref 0.44–1.00)
GFR, Estimated: >60 mL/min (ref >60)
Glucose, Bld: 128 mg/dL — ABNORMAL HIGH (ref 70–99)
Potassium: 4 mmol/L (ref 3.5–5.1)
Sodium: 131 mmol/L — ABNORMAL LOW (ref 135–145)
Total Bilirubin: 0.7 mg/dL (ref 0.0–1.2)
Total Protein: 7.1 g/dL (ref 6.5–8.1)

## 2023-12-18 LAB — CBC WITH DIFFERENTIAL/PLATELET
Abs Immature Granulocytes: 0.03 K/uL (ref 0.00–0.07)
Basophils Absolute: 0 K/uL (ref 0.0–0.1)
Basophils Relative: 1 %
Eosinophils Absolute: 0.1 K/uL (ref 0.0–0.5)
Eosinophils Relative: 2 %
HCT: 32.3 % — ABNORMAL LOW (ref 36.0–46.0)
Hemoglobin: 10.2 g/dL — ABNORMAL LOW (ref 12.0–15.0)
Immature Granulocytes: 1 %
Lymphocytes Relative: 11 %
Lymphs Abs: 0.5 K/uL — ABNORMAL LOW (ref 0.7–4.0)
MCH: 27.5 pg (ref 26.0–34.0)
MCHC: 31.6 g/dL (ref 30.0–36.0)
MCV: 87.1 fL (ref 80.0–100.0)
Monocytes Absolute: 0.6 K/uL (ref 0.1–1.0)
Monocytes Relative: 13 %
Neutro Abs: 3.4 K/uL (ref 1.7–7.7)
Neutrophils Relative %: 72 %
Platelets: 237 K/uL (ref 150–400)
RBC: 3.71 MIL/uL — ABNORMAL LOW (ref 3.87–5.11)
RDW: 16.1 % — ABNORMAL HIGH (ref 11.5–15.5)
WBC: 4.6 K/uL (ref 4.0–10.5)
nRBC: 0 % (ref 0.0–0.2)

## 2023-12-18 LAB — LIPASE, BLOOD: Lipase: 25 U/L (ref 11–51)

## 2023-12-18 LAB — TYPE AND SCREEN
ABO/RH(D): O POS
Antibody Screen: NEGATIVE

## 2023-12-18 LAB — TROPONIN I (HIGH SENSITIVITY)
Troponin I (High Sensitivity): 8 ng/L (ref <18)
Troponin I (High Sensitivity): 8 ng/L (ref <18)

## 2023-12-18 MED ORDER — LACTATED RINGERS IV BOLUS
1000.0000 mL | Freq: Once | INTRAVENOUS | Status: AC
  Administered 2023-12-18: 1000 mL via INTRAVENOUS

## 2023-12-18 MED ORDER — HYDROMORPHONE HCL 1 MG/ML IJ SOLN
1.0000 mg | Freq: Once | INTRAMUSCULAR | Status: AC
  Administered 2023-12-18: 1 mg via INTRAVENOUS
  Filled 2023-12-18: qty 1

## 2023-12-18 MED ORDER — HYDROMORPHONE HCL 1 MG/ML IJ SOLN
0.5000 mg | Freq: Once | INTRAMUSCULAR | Status: AC
  Administered 2023-12-18: 0.5 mg via INTRAVENOUS
  Filled 2023-12-18: qty 0.5

## 2023-12-18 NOTE — Discharge Instructions (Addendum)
 Fortunately there are no  broken bones or bleeding seen on your imaging today.  There is some narrowing of your colon that will need to be followed up by colonoscopy.  You can follow-up with your gastroenterologist for this.  Otherwise, if you develop new or worsening pain, fever, or any other new/concerning symptoms then return to the ER.

## 2023-12-18 NOTE — Patient Instructions (Signed)
 Visit Information  Thank you for taking time to visit with me today. Please don't hesitate to contact me if I can be of assistance to you before our next scheduled appointment.  Our next appointment is by telephone on 12-31-2023 at 10:30 am Please call the care guide team at [phone] if you need to cancel or reschedule your appointment.   Following is a copy of your care plan:   Goals Addressed             This Visit's Progress    VBCI RN Care Plan: AFIB       Problems:  Chronic Disease Management support and education needs related to Atrial Fibrillation  Goal: Over the next 90 days the Patient will attend all scheduled medical appointments: with primary care provider and specialist as evidenced by keeping all scheduled appointments        continue to work with RN Care Manager and/or Social Worker to address care management and care coordination needs related to Atrial Fibrillation as evidenced by adherence to care management team scheduled appointments     take all medications exactly as prescribed and will call provider for medication related questions as evidenced by compliance with all medications    verbalize basic understanding of Atrial Fibrillation disease process and self health management plan as evidenced by verbal explanation, recognizing/monitoring symptoms, lifestyle modifications  Interventions:   AFIB Interventions: Counseled on increased risk of stroke due to Afib and benefits of anticoagulation for stroke prevention Reviewed importance of adherence to anticoagulant exactly as prescribed Advised patient to discuss acute changes/bleeding with provider Counseled on bleeding risk associated with Eliquis  and importance of self-monitoring for signs/symptoms of bleeding Counseled on avoidance of NSAIDs due to increased bleeding risk with anticoagulants Counseled on importance of regular laboratory monitoring as prescribed Counseled on seeking medical attention after a  head injury or if there is blood in the urine/stool Afib action plan reviewed Screening for signs and symptoms of depression related to chronic disease state  Assessed social determinant of health barriers  Anemia/Bleeding Interventions: Assessment of understanding of anemia/bleeding disorder diagnosis Basic overview and discussion of anemia/bleeding disorder or acute disease state Medications reviewed Counseled on bleeding risk associated with Eliquis  and importance of self-monitoring for signs/symptoms of bleeding. Patient reporting GI bleeding, fatigue, decreased appetitie and recent fall. RNCM sent PCP inbasket message. Counseled on avoidance of NSAIDs due to increased bleeding risk Counseled on importance of regular laboratory monitoring as directed by provider Provided education about signs and symptoms of active bleeding such as stomach discomfort, coughing up blood or blood tinged secretions, bleeding from the gums/teeth, nosebleeds, increased bruising, blood in the urine/stool and/or if a traumatic injury occurs, regardless of severity of injury  Advised to call provider or 911 if active bleeding or signs and symptoms of active bleeding occur recommended promotion of rest and energy-conserving measures to manage fatigue, such as balancing activity with periods of rest encouraged strategies to prevent falls related to fatigue, weakness and dizziness; encouraged sitting before standing and using an assistive device encouraged optimal oral intake to support fluid balance and nutrition Screening for signs and symptoms of depression related to chronic disease state Assessed social determinant of health barriers  Lab Results  Component Value Date   WBC 5.9 10/23/2023   HGB 10.6 (L) 10/23/2023   HCT 32.6 (L) 10/23/2023   MCV 91.8 10/23/2023   PLT 254 10/23/2023     Patient Self-Care Activities:  Attend all scheduled provider appointments Take medications as  prescribed   bring symptom  diary to all appointments keep all lab appointments take medicine as prescribed  Plan:  Telephone follow up appointment with care management team member scheduled for:  12-31-2023 at 10:30 am          VBCI RN Care Plan: DM       Problems:  Chronic Disease Management support and education needs related to DMII  Goal: Over the next 90 days the Patient will attend all scheduled medical appointments: with primary care provider and specialist as evidenced by keeping all scheduled appointments        continue to work with RN Care Manager and/or Social Worker to address care management and care coordination needs related to DMII as evidenced by adherence to care management team scheduled appointments     take all medications exactly as prescribed and will call provider for medication related questions as evidenced by compliance with all medications    verbalize basic understanding of DMII disease process and self health management plan as evidenced by verbal explanation, recognizing/monitoring symptoms, lifestyle modifications  Interventions:    Diabetes Interventions: Assessed patient's understanding of A1c goal: <7% Provided education to patient about basic DM disease process Reviewed medications with patient and discussed importance of medication adherence Counseled on importance of regular laboratory monitoring as prescribed Discussed plans with patient for ongoing care management follow up and provided patient with direct contact information for care management team Provided patient with written educational materials related to hypo and hyperglycemia and importance of correct treatment Reviewed scheduled/upcoming provider appointments including: NO PCP F/U scheduled Advised patient, providing education and rationale, to check cbg with Dexcom and record, calling provider for findings outside established parameters Review of patient status, including review of consultants reports,  relevant laboratory and other test results, and medications completed Screening for signs and symptoms of depression related to chronic disease state  Assessed social determinant of health barriers Lab Results  Component Value Date   HGBA1C 6.7 (A) 09/21/2023     Patient Self-Care Activities:  Call provider office for new concerns or questions  Take medications as prescribed   schedule appointment with eye doctor check blood sugar at prescribed times: before meals and at bedtime check feet daily for cuts, sores or redness fill half of plate with vegetables limit fast food meals to no more than 1 per week manage portion size set a realistic goal switch to low-fat or skim milk switch to sugar-free drinks wash and dry feet carefully every day  Plan:  Telephone follow up appointment with care management team member scheduled for:  12-31-2023 at 10:30 am             Please call the Suicide and Crisis Lifeline: 988 call the [place]  National Suicide Prevention Lifeline: 206-657-9271 or TTY: (737) 691-3771 TTY 828-517-6518) to talk to a trained counselor call 1-800-273-TALK (toll free, 24 hour hotline) call the [facility]: [phone] call 911 if you are experiencing a Mental Health or Behavioral Health Crisis or need someone to talk to.  Patient verbalizes understanding of instructions and care plan provided today and agrees to view in MyChart. Active MyChart status and patient understanding of how to access instructions and care plan via MyChart confirmed with patient.     Rosina Forte, BSN RN [facility], [facility] RN Care Manager Direct Dial : [phone]  Fax: [phone]

## 2023-12-18 NOTE — Telephone Encounter (Signed)
 Patient called back, she said her phone messed up shortly after making the last appoinmtment and could not receive or make calls.   New appointment is : Wed December 23, 2023 @ 3:20pm

## 2023-12-18 NOTE — ED Triage Notes (Signed)
 Pt was at home and fell onto her butt and back onto her head. Pt is on blood thinners and no hematoma noted to the back of her head.

## 2023-12-18 NOTE — Patient Outreach (Signed)
 Complex Care Management   Visit Note  12/18/2023  Name:  Amber Stephenson MRN: [id number] DOB: 1945-06-11  Situation: Referral received for Complex Care Management related to Diabetes with Complications I obtained verbal consent from Patient.  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   Benign paroxysmal positional vertigo due to bilateral vestibular disorder 05/20/2019   Bipolar 1 disorder (HCC) 01/23/2015   with GAD, benzo dependence   CAD (coronary artery disease)    Nonobstructive; Managed by Dr. Charls   Cardiomegaly 01/12/2018   CHF (congestive heart failure) 06/08/2022   Chronic back pain 01/04/2015   Chronic constipation 04/25/2020   Chronic diastolic heart failure (HCC) 05/30/2015   Chronic pain syndrome 08/22/2019   back pain, sacroiliitis   Chronic post-traumatic stress disorder (PTSD) 12/06/2020   Diabetic neuropathy (HCC) 02/06/2016   Dyslipidemia 09/24/2020   Essential hypertension    Functional diarrhea 10/26/2020   Herpes genitalis in women 07/16/2015   History of adenomatous polyp of colon 05/21/2019   Overview:   03/31/17: Colonoscopy: nonadvanced adenoma, microscopic colitis, f/u 5 yrs, [facility]/GAP   History of radiation therapy    Endometrial- 02/18/23-03/31/23-Dr. Lynwood Kinard   Hypotension 09/01/2022   Insomnia 01/23/2015   Metabolic acidosis 01/12/2023   Migraine headache with aura 02/12/2016   Myofascial pain dysfunction syndrome 08/22/2019   Non-alcoholic fatty liver disease 01/12/2018   OSA (obstructive sleep apnea) 02/24/2019   10/09/2018 - HST  - AHI 40.6    Osteopenia 12/06/2020   Rx alendronate  35 mg.    Pinguecula 12/06/2020   Presbyopia 12/06/2020   Pulmonary hypertension    RLS (restless legs syndrome) 04/27/2015   RSV (respiratory syncytial virus infection) 06/10/2023   Tremor, essential 12/11/2021   Type II diabetes mellitus (HCC) 09/24/2020    Assessment: Patient Reported Symptoms:  Cognitive Cognitive Status: Difficulties with attention and concentration Cognitive/Intellectual Conditions Management [RPT]: None reported or documented in medical history or problem list   Health Maintenance Behaviors: Annual physical exam Healing Pattern: Unsure Health Facilitated by: Rest  Neurological Neurological Review of Symptoms: Dizziness Neurological Management Strategies: Routine screening Neurological Self-Management Outcome: 3 (uncertain)  HEENT HEENT Symptoms Reported: Ear pain HEENT Management Strategies: Medication therapy, Routine screening HEENT Self-Management Outcome: 3 (uncertain)    Cardiovascular Cardiovascular Symptoms Reported: Swelling in legs or feet Does patient have uncontrolled Hypertension?: No Cardiovascular Management Strategies: Medication therapy, Routine screening Cardiovascular Self-Management Outcome: 4 (good)  Respiratory Respiratory Symptoms Reported: No symptoms reported Respiratory Self-Management Outcome: 4 (good)  Endocrine Endocrine Symptoms Reported: Shakiness Is patient diabetic?: Yes Is patient checking blood sugars at home?: Yes List most recent blood sugar readings, include date and time of day: fbg 71 Endocrine Self-Management Outcome: 3 (uncertain)  Gastrointestinal Gastrointestinal Symptoms Reported: Constipation, Diarrhea, Bleeding Gastrointestinal Management Strategies: Medication therapy Gastrointestinal Self-Management Outcome: 3 (uncertain)    Genitourinary Genitourinary Symptoms Reported: Pain/burning with urination Genitourinary Self-Management Outcome: 3 (uncertain)  Integumentary Integumentary Symptoms Reported:  Wound Additional Integumentary Details: Reports great toe wound that is to be evaluated by Dr. Roddie next week. No s/s of infection noted by patient/spouse Skin Management Strategies: Routine screening Skin Self-Management Outcome: 3 (uncertain)  Musculoskeletal Musculoskelatal Symptoms Reviewed: Unsteady gait, Weakness, Limited  mobility Musculoskeletal Management Strategies: Routine screening Musculoskeletal Self-Management Outcome: 3 (uncertain) Falls in the past year?: Yes Number of falls in past year: 2 or more Was there an injury with Fall?: Yes Fall Risk Category Calculator: 3 Patient Fall Risk Level: High Fall Risk Patient at Risk for Falls Due to: History of fall(s), Impaired balance/gait Fall risk Follow up: Education provided, Falls prevention discussed, Falls evaluation completed  Psychosocial Psychosocial Symptoms Reported: Depression - if selected complete PHQ 2-9 Behavioral Management Strategies: Medication therapy Behavioral Health Self-Management Outcome: 3 (uncertain) Major Change/Loss/Stressor/Fears (CP): Medical condition, self Techniques to Cope with Loss/Stress/Change: Diversional activities, Medication Quality of Family Relationships: helpful, involved, supportive Do you feel physically threatened by others?: No      12/18/2023    2:17 PM  Depression screen PHQ 2/9  Decreased Interest 2  Down, Depressed, Hopeless 2  PHQ - 2 Score 4  Altered sleeping 3  Tired, decreased energy 3  Change in appetite 3  Feeling bad or failure about yourself  3  Trouble concentrating 3  Moving slowly or fidgety/restless 3  Suicidal thoughts 0  PHQ-9 Score 22  Difficult doing work/chores Extremely dIfficult    There were no vitals filed for this visit.  Medications Reviewed Today   Medications were not reviewed in this encounter   Patient deferred medication review/reconciliation at this time.  Recommendation:   I completed outreach with the patient this afternoon. She  reports continued bloody stools and episodes of nocturnal hypoglycemia, likely due to limited intake-she is currently only able to tolerate Jell-O. Her husband has recently started providing her with a nutritional shake for supplementation.   During the call, the patient appeared extremely fatigued and was unable to continue the conversation; her husband completed the call on her behalf. He noted that her fatigue has gradually worsened over the past 4-5 days with a fall today. He also mentioned they are awaiting cardiology clearance before proceeding with her colonoscopy.   I wanted to express concern, as the patient was unable to quantify the amount of blood loss. I advised that if her extreme fatigue continues, she should consider presenting to the Emergency Department for further evaluation.   Follow Up Plan:   Telephone follow-up in 2 weeks  Rosina Forte, BSN RN [facility], [facility] RN Care Manager Direct Dial : [phone]  Fax: [phone]

## 2023-12-18 NOTE — ED Provider Notes (Addendum)
 [place] EMERGENCY DEPARTMENT AT [facility] Provider Note   CSN: [id number] Arrival date & time: 12/18/23  1612     Patient presents with: Amber Stephenson is a 78 y.o. female.   HPI 57 old female presents with a fall.  She states she has been feeling dizzy and weak for the past 4 days or so.  She has also had concomitant UTI symptoms and was put on Cipro  by her doctor.  Today she was trying to put on her shoe while standing and missed the shoe and fell backwards and landed on her buttocks and then hit her back and then her head.  No LOC.  She is on Eliquis  for A-fib.  She is mostly having a 7 out of 10 headache but has also been having 1 week of progressive abdominal pain and is currently also hurting in her neck, back, and bilateral posterior hips.  Denies any focal weakness though does state for the past several months she has been having left leg weakness more than her right and favors that side.  No chest pain, shortness of breath, coughing today.  Denies any fevers.  She endorses months of rectal bleeding that is being worked up as an outpatient. No increase in her bleeding recently.  Prior to Admission medications   Medication Sig Start Date End Date Taking? Authorizing Provider  amoxicillin  (AMOXIL ) 500 MG tablet Take 500 mg by mouth 3 (three) times daily. 12/09/23   [provider]  ANUCORT-HC  25 MG suppository Place 25 mg rectally daily. 09/28/23   [provider]  ARIPiprazole  (ABILIFY ) 2 MG tablet Take 2 mg by mouth at bedtime. 10/07/21   [provider]  atorvastatin  (LIPITOR) 80 MG tablet Take 1 tablet (80 mg total) by mouth daily. 02/05/23 12/10/23  Lucien Orren SAILOR, PA-C  BELSOMRA  20 MG TABS Take 1 tablet by mouth at bedtime as needed. 10/16/23   [provider]  carbidopa -levodopa  (SINEMET ) 10-100 MG tablet Take 1 tablet by mouth 4 (four) times daily. For restless leg syndrome 12/01/23   [name], MD  chlorhexidine   (PERIDEX ) 0.12 % solution Use as directed 5 mLs in the mouth or throat 2 (two) times daily. 12/09/23   [provider]  ciprofloxacin  (CIPRO ) 500 MG tablet Take 1 tablet (500 mg total) by mouth 2 (two) times daily. 12/16/23   [name], MD  ciprofloxacin -hydrocortisone  (CIPRO  HC OTIC) OTIC suspension Place 3 drops into both ears 2 (two) times daily. For one week 12/01/23   [name], MD  colchicine  0.6 MG tablet Take 1 tablet (0.6 mg total) by mouth 2 (two) times daily. For mouth sores 10/21/23   [name], MD  Continuous Glucose Sensor (DEXCOM G7 SENSOR) MISC CHANGE SENSOR EVERY 10 DAYS 09/28/23   Shamleffer, Donell Cardinal, MD  cyanocobalamin  (VITAMIN B12) 1000 MCG tablet Take 1 tablet (1,000 mcg total) by mouth daily. 09/04/22   Ghimire, Donalda HERO, MD  denosumab  (PROLIA ) 60 MG/ML SOSY injection Inject 60 mg into the skin every 6 (six) months. 08/14/20   [name], MD  diphenoxylate -atropine  (LOMOTIL ) 2.5-0.025 MG tablet TAKE 2 TABLETS BY MOUTH FOUR TIMES DAILY AS NEEDED FOR DIARRHEA OR LOOSE STOOLS 11/24/23   [name], MD  ELIQUIS  5 MG TABS tablet TAKE 1 TABLET TWICE A DAY 11/02/23   Shlomo Wilbert SAUNDERS, MD  escitalopram  (LEXAPRO ) 10 MG tablet Take 10 mg by mouth daily.    [provider]  fluticasone -salmeterol (ADVAIR DISKUS) 500-50 MCG/ACT  AEPB Inhale 1 puff into the lungs in the morning and at bedtime. 10/06/23   [name], MD  furosemide  (LASIX ) 40 MG tablet Take 40 mg by mouth daily. 09/18/23   [provider]  hydrochlorothiazide  (MICROZIDE ) 12.5 MG capsule Take 12.5 mg by mouth daily. 11/19/23   [provider]  hydrOXYzine  (VISTARIL ) 25 MG capsule Take 1 capsule (25 mg total) by mouth 3 (three) times daily. 12/01/23   [name], MD  insulin  glargine (LANTUS  SOLOSTAR) 100 UNIT/ML Solostar Pen Inject 30 Units into the skin 2 (two) times daily. 09/21/23   Shamleffer, Ibtehal Jaralla, MD  insulin  lispro (HUMALOG  KWIKPEN) 100 UNIT/ML KwikPen Max  daily 45 units 05/22/23   Shamleffer, Ibtehal Jaralla, MD  Insulin  Pen Needle 29G X MISC 1 Device by Does not apply route daily in the afternoon. 05/22/23   Shamleffer, Ibtehal Jaralla, MD  LORazepam  (ATIVAN ) 0.5 MG tablet Take 0.5-1 mg by mouth in the morning, at noon, and at bedtime. Take 0.5 mg by mouth in the morning, 0.5 mg at lunch, and 0.5 mg at bedtime    [provider]  losartan  (COZAAR ) 25 MG tablet Take 25 mg by mouth daily. 02/14/23   [provider]  MAGNESIUM  PO Take 1 tablet by mouth daily.    [provider]  meclizine  (ANTIVERT ) 25 MG tablet Take 1 tablet (25 mg total) by mouth 3 (three) times daily as needed for dizziness. 06/05/23   Dettinger, Fonda LABOR, MD  metFORMIN  (GLUCOPHAGE -XR) 500 MG 24 hr tablet Take 2 tablets (1,000 mg total) by mouth daily with breakfast. 05/22/23   Shamleffer, Donell Cardinal, MD  Methocarbamol  1000 MG TABS Take 1,000 mg by mouth every 6 (six) hours as needed. 08/31/23   Lovorn, Megan, MD  metoprolol  tartrate (LOPRESSOR ) 50 MG tablet TAKE 1 TABLET TWICE A DAY 12/09/23   Shlomo Wilbert SAUNDERS, MD  omeprazole  (PRILOSEC) 40 MG capsule Take 1 capsule (40 mg total) by mouth 2 (two) times daily before a meal. Patient not taking: Reported on 12/10/2023 10/23/23   Rudy Josette RAMAN, PA-C  ondansetron  (ZOFRAN ) 4 MG tablet Take 1 tablet (4 mg total) by mouth every 8 (eight) hours as needed for nausea or vomiting. For cancer related nausea 10/23/23   Rudy Josette RAMAN, PA-C  OXcarbazepine  (TRILEPTAL ) 150 MG tablet Take 1 tablet (150 mg total) by mouth 2 (two) times daily. 06/13/22   Sherrill Cable Latif, DO  oxyCODONE  (OXYCONTIN ) 20 mg 12 hr tablet Take 1 tablet (20 mg total) by mouth every 12 (twelve) hours. 10/29/23   Debby Fidela CROME, NP  potassium chloride  (KLOR-CON ) 10 MEQ tablet Take 1 tablet (10 mEq total) by mouth daily. 02/05/23 12/10/23  Conte, Tessa N, PA-C  rOPINIRole  (REQUIP ) 1 MG tablet Take 1 mg by mouth at bedtime.    [provider]  tirzepatide  (MOUNJARO ) 12.5 MG/0.5ML Pen Inject 12.5 mg into the skin once a week. 09/21/23   Shamleffer, Donell Cardinal, MD  Vitamin D , Cholecalciferol , 25 MCG (1000 UT) TABS Take 1,000 Units by mouth daily in the afternoon. 07/26/20   [provider]    Allergies: Iodinated contrast media, Iodine, Latex, Tape, and Tizanidine     Review of Systems  Constitutional:  Negative for fever.  Respiratory:  Negative for cough and shortness of breath.   Cardiovascular:  Negative for chest pain.  Gastrointestinal:  Positive for abdominal pain and blood in stool.  Genitourinary:  Positive for dysuria, frequency and urgency.  Musculoskeletal:  Positive for  back pain and neck pain.  Neurological:  Positive for weakness and headaches.    Updated Vital Signs BP (!) 145/86   Pulse 84   Temp 98.3 F (36.8 C) (Rectal)   Resp 19   Ht 5' 2 (1.575 m)   Wt 77.1 kg   SpO2 97%   BMI 31.09 kg/m   Physical Exam Vitals and nursing note reviewed.  Constitutional:      General: She is not in acute distress.    Appearance: She is well-developed. She is not ill-appearing or diaphoretic.  HENT:     Head: Normocephalic and atraumatic.     Comments: No scalp hematoma/laceration Neck:     Comments: Diffuse posterior neck tenderness Cardiovascular:     Rate and Rhythm: Normal rate. Rhythm irregular.     Pulses:          Dorsalis pedis pulses are 2+ on the right side and 2+ on the left side.     Heart sounds: Normal heart sounds.  Pulmonary:     Effort: Pulmonary effort is normal.     Breath sounds: Normal breath sounds.  Abdominal:     Palpations: Abdomen is soft.     Tenderness: There is no abdominal tenderness.  Musculoskeletal:     Cervical back: Tenderness present.     Thoracic back: Tenderness present.     Lumbar back: Tenderness present.     Right hip: Tenderness present. No deformity. Normal range of motion.     Left hip: Tenderness present. No deformity. Normal range of  motion.     Right knee: Normal range of motion.     Left knee: Normal range of motion.  Skin:    General: Skin is warm and dry.  Neurological:     Mental Status: She is alert.     (all labs ordered are listed, but only abnormal results are displayed) Labs Reviewed  COMPREHENSIVE METABOLIC PANEL WITH GFR - Abnormal; Notable for the following components:      Result Value   Sodium 131 (*)    Chloride 95 (*)    Glucose, Bld 128 (*)    All other components within normal limits  CBC WITH DIFFERENTIAL/PLATELET - Abnormal; Notable for the following components:   RBC 3.71 (*)    Hemoglobin 10.2 (*)    HCT 32.3 (*)    RDW 16.1 (*)    Lymphs Abs 0.5 (*)    All other components within normal limits  URINALYSIS, W/ REFLEX TO CULTURE (INFECTION SUSPECTED) - Abnormal; Notable for the following components:   Color, Urine STRAW (*)    Protein, ur 30 (*)    All other components within normal limits  LIPASE, BLOOD  TYPE AND SCREEN  TROPONIN I (HIGH SENSITIVITY)  TROPONIN I (HIGH SENSITIVITY)    ED ECG REPORT   Date: 07/18//2025  Rate: 75  Rhythm: atrial fibrillation  QRS Axis: left  Intervals: QT prolonged  ST/T Wave abnormalities: nonspecific ST changes  Conduction Disutrbances:nonspecific intraventricular conduction delay   I have personally reviewed the EKG tracing and agree with the computerized printout as noted.   Radiology: DG Hip Unilat W or Wo Pelvis 2-3 Views Right Result Date: 12/18/2023 CLINICAL DATA:  Pain after fall at home. EXAM: DG HIP (WITH OR WITHOUT PELVIS) 2-3V RIGHT; DG HIP (WITH OR WITHOUT PELVIS) 2-3V LEFT COMPARISON:  Abdominopelvic CT earlier today FINDINGS: Right hip: No acute fracture or dislocation. Femoral head is well seated. Slight hip joint space narrowing with acetabular  spurring. Left hip: No acute fracture or dislocation. Femoral head is well seated. Slight hip joint space narrowing with acetabular spurring. Pelvis: No acute fracture. Pubic rami are  intact. No pubic symphyseal or sacroiliac diastasis. IMPRESSION: 1. No acute fracture or dislocation of the bilateral hips or pelvis. 2. Mild bilateral hip osteoarthritis. Electronically Signed   By: Andrea Gasman M.D.   On: 12/18/2023 18:27   DG Hip Unilat W or Wo Pelvis 2-3 Views Left Result Date: 12/18/2023 CLINICAL DATA:  Pain after fall at home. EXAM: DG HIP (WITH OR WITHOUT PELVIS) 2-3V RIGHT; DG HIP (WITH OR WITHOUT PELVIS) 2-3V LEFT COMPARISON:  Abdominopelvic CT earlier today FINDINGS: Right hip: No acute fracture or dislocation. Femoral head is well seated. Slight hip joint space narrowing with acetabular spurring. Left hip: No acute fracture or dislocation. Femoral head is well seated. Slight hip joint space narrowing with acetabular spurring. Pelvis: No acute fracture. Pubic rami are intact. No pubic symphyseal or sacroiliac diastasis. IMPRESSION: 1. No acute fracture or dislocation of the bilateral hips or pelvis. 2. Mild bilateral hip osteoarthritis. Electronically Signed   By: Andrea Gasman M.D.   On: 12/18/2023 18:27   DG Thoracic Spine 2 View Result Date: 12/18/2023 CLINICAL DATA:  Pain after fall at home. EXAM: THORACIC SPINE 2 VIEWS COMPARISON:  Thoracic spine radiograph 07/13/2023, included portions from abdominopelvic CT earlier today FINDINGS: Slight broad-based dextroscoliotic curvature perineum chronic thoracic kyphosis. Subtle T9 and T11 compression deformities are remote when compared with abdominal CT earlier today. There is no evidence of acute fracture. Posterior elements are grossly intact. IMPRESSION: 1. No acute fracture of the thoracic spine. 2. Subtle T9 and T11 compression deformities are remote when compared with abdominal CT earlier today. Electronically Signed   By: Andrea Gasman M.D.   On: 12/18/2023 18:26   DG Chest 1 View Result Date: 12/18/2023 CLINICAL DATA:  Fall at home. EXAM: CHEST  1 VIEW COMPARISON:  Lung bases from abdominal CT earlier today. Chest  radiograph 10/06/2023. Chest CT 06/26/2023 FINDINGS: Chronic cardiomegaly. Unchanged mediastinal contours. Ill-defined opacity in the right mid lower lung zone. No pneumothorax or pleural effusion. Right shoulder arthroplasty. Irregularity of the left proximal humerus is likely chronic, but only partially included on prior images. IMPRESSION: 1. Ill-defined opacity in the right mid lower lung zone, may represent atelectasis or pneumonia. 2. Chronic cardiomegaly. Electronically Signed   By: Andrea Gasman M.D.   On: 12/18/2023 18:25   CT Cervical Spine Wo Contrast Result Date: 12/18/2023 CLINICAL DATA:  Provided history: Neck trauma (Age >= 65y) Fall at home. EXAM: CT CERVICAL SPINE WITHOUT CONTRAST TECHNIQUE: Multidetector CT imaging of the cervical spine was performed without intravenous contrast. Multiplanar CT image reconstructions were also generated. RADIATION DOSE REDUCTION: This exam was performed according to the departmental dose-optimization program which includes automated exposure control, adjustment of the mA and/or kV according to patient size and/or use of iterative reconstruction technique. COMPARISON:  CT cervical spine 05/14/2023 FINDINGS: Alignment: Chronic straightening and broad-based reversal of normal lordosis. No traumatic subluxation. Skull base and vertebrae: No acute fracture. Vertebral body heights are maintained. The dens and skull base are intact. None fusion posterior arch of C1. Soft tissues and spinal canal: No prevertebral fluid or swelling. No visible canal hematoma. Disc levels: Multilevel disc space narrowing and spurring, without significant change from prior exam. Upper chest: Motion artifact limitations. Other: None. IMPRESSION: 1. No acute fracture or subluxation of the cervical spine. 2. Multilevel degenerative disc disease. Electronically Signed  By: Andrea Gasman M.D.   On: 12/18/2023 18:23   CT Head Wo Contrast Result Date: 12/18/2023 CLINICAL DATA:  Head  trauma, minor (Age >= 65y) Fall at home. EXAM: CT HEAD WITHOUT CONTRAST TECHNIQUE: Contiguous axial images were obtained from the base of the skull through the vertex without intravenous contrast. RADIATION DOSE REDUCTION: This exam was performed according to the departmental dose-optimization program which includes automated exposure control, adjustment of the mA and/or kV according to patient size and/or use of iterative reconstruction technique. COMPARISON:  Head CT 06/10/2023 FINDINGS: Brain: No intracranial hemorrhage, mass effect, or midline shift. Stable degree of atrophy. No hydrocephalus. The basilar cisterns are patent. Stable periventricular chronic small vessel ischemic change. No evidence of territorial infarct or acute ischemia. No extra-axial or intracranial fluid collection. Vascular: Atherosclerosis of skullbase vasculature without hyperdense vessel or abnormal calcification. Skull: No fracture or focal lesion. Sinuses/Orbits: No acute finding. Other: None. IMPRESSION: 1. No acute intracranial abnormality. No skull fracture. 2. Stable atrophy and chronic small vessel ischemic change. Electronically Signed   By: Andrea Gasman M.D.   On: 12/18/2023 18:18   CT ABDOMEN PELVIS WO CONTRAST Result Date: 12/18/2023 CLINICAL DATA:  Provided history: Diverticulitis, complication suspected Technologist notes state: Fall at home. EXAM: CT ABDOMEN AND PELVIS WITHOUT CONTRAST TECHNIQUE: Multidetector CT imaging of the abdomen and pelvis was performed following the standard protocol without IV contrast. RADIATION DOSE REDUCTION: This exam was performed according to the departmental dose-optimization program which includes automated exposure control, adjustment of the mA and/or kV according to patient size and/or use of iterative reconstruction technique. COMPARISON:  Abdominopelvic CT 10/23/2023 FINDINGS: Lower chest: Ill-defined airspace disease in the right lower lobe, series 4, image 22. No pleural  effusion. Hepatobiliary: No focal liver abnormality on this unenhanced exam. Gallbladder physiologically distended, no calcified stone. No biliary dilatation. Pancreas: No ductal dilatation or inflammation. Spleen: Normal in size without focal abnormality. Adrenals/Urinary Tract: No suspicious adrenal nodule or evidence of adrenal hemorrhage. No renal calculi or hydronephrosis. Lobulated renal contours. Partially distended urinary bladder, equivocal perivesicular fat stranding. Stomach/Bowel: Unremarkable appearance of the stomach. Duodenal diverticulum without inflammation. No small bowel obstruction or inflammatory change. Normal appendix. Moderate volume of stool in the colon. Scattered colonic diverticula without acute inflammation. Questionable area of wall thickening/narrowing in the proximal sigmoid series 2, image 76, at site of previous mural thickening. Vascular/Lymphatic: Aortic atherosclerosis. No aneurysm. No enlarged lymph nodes in the abdomen or pelvis. Reproductive: Uterus and bilateral adnexa are unremarkable. Other: No free air or free fluid. Again seen fat containing right inguinal hernia. Musculoskeletal: No acute fracture of the pelvis, lumbar spine or included thoracic spine. Multilevel degenerative change in the spine with scattered Schmorl's nodes. Chronic appearing compression deformities of T9 and T11, unchanged from prior exam. IMPRESSION: 1. No evidence of diverticulitis. No acute findings or posttraumatic sequela. 2. Ill-defined airspace disease in the right lower lobe, atelectasis versus pneumonia. 3. Colonic diverticulosis without acute inflammation. 4. Questionable area of wall thickening/narrowing in the proximal sigmoid colon, at site of previous mural thickening. This may represent stricture related to prior inflammation, however cannot exclude colonic neoplasm. Recommend correlation with colonoscopy if not recently performed. Aortic Atherosclerosis (ICD10-I70.0). Electronically  Signed   By: Andrea Gasman M.D.   On: 12/18/2023 18:15     Procedures   Medications Ordered in the ED  lactated ringers  bolus 1,000 mL (0 mLs Intravenous Stopped 12/18/23 2010)  HYDROmorphone  (DILAUDID ) injection 0.5 mg (0.5 mg Intravenous Given 12/18/23 1720)  HYDROmorphone  (DILAUDID ) injection 1 mg (1 mg Intravenous Given 12/18/23 1825)                                    Medical Decision Making Amount and/or Complexity of Data Reviewed Labs: ordered.    Details: Normal troponin x 2 Radiology: ordered and independent interpretation performed.    Details: No head bleed ECG/medicine tests: ordered and independent interpretation performed.    Details: A-fib  Risk Prescription drug management.   Presents with a fall and minor head injury and multiple areas of pain.  She is also been having acute on chronic abdominal pain.  CTs did not show any acute emergent pathology.  I suspect the atelectasis versus pneumonia is more atelectasis as she does not have a cough or shortness of breath.  No signs of a head bleed.  No fractures noted.  She has been ambulatory so I highly doubt an occult hip fracture.  After some pain medicine she is feeling better and feels well enough for discharge.  Vital signs are reassuring.  Will discharge home with return precautions.  Will have her follow-up with GI for the narrowing of her colon seen as she already is trying to be set up for outpatient EGD and colonoscopy.     Final diagnoses:  Fall, initial encounter    ED Discharge Orders     None          Freddi Hamilton, MD 12/18/23 [id number]    Freddi Hamilton, MD 01/10/24 [phone]

## 2023-12-20 ENCOUNTER — Ambulatory Visit: Payer: Self-pay | Admitting: Family Medicine

## 2023-12-20 ENCOUNTER — Encounter: Payer: Self-pay | Admitting: Family Medicine

## 2023-12-20 LAB — URINE CULTURE

## 2023-12-21 ENCOUNTER — Telehealth: Payer: Self-pay

## 2023-12-21 ENCOUNTER — Ambulatory Visit: Payer: Self-pay

## 2023-12-21 DIAGNOSIS — F251 Schizoaffective disorder, depressive type: Secondary | ICD-10-CM | POA: Diagnosis not present

## 2023-12-21 NOTE — Telephone Encounter (Signed)
Pt. Needs to be seen for this. Thanks, WS 

## 2023-12-21 NOTE — Telephone Encounter (Signed)
 FYI Only or Action Required?: Action required by provider: requesting medication for new symptoms.  Patient was last seen in primary care on 12/16/2023 by [name], MD.  Called Nurse Triage reporting Groin Swelling.  Symptoms began 1-2 days ago.  Interventions attempted: Nothing.  Symptoms are: gradually worsening.  Triage Disposition: See Physician Within 24 Hours  Patient/caregiver understands and will follow disposition?: No, wishes to speak with PCP    Copied from CRM [id number]. Topic: Clinical - Red Word Triage >> Dec 21, 2023  3:05 PM Amber Stephenson wrote: Red Word that prompted transfer to Nurse Triage: Patient states she was in the office on 07/16 for a bladder infection. Since being seen states her frequency and intensity in which she has to use the restroom has increased. Also her vagina and rectum and tender to touch and very swollen. Says has gotten so bad she has trouble sitting.     Reason for Disposition  MODERATE-SEVERE itching (i.e., interferes with school, work, or sleep)  Answer Assessment - Initial Assessment Questions Patient declined an appointment stating she was just seen in the office. Patient would like treatment options and/or medication for her symptoms. Please advise.     1. SYMPTOM: What's the main symptom you're concerned about? (e.g., pain, itching, dryness)     Swelling  2. LOCATION: Where is the swelling located? (e.g., inside/outside, left/right)     Outside 3. ONSET: When did the swelling start?     1-2 days ago  4. PAIN: Is there any pain? If Yes, ask: How bad is it? (Scale: 1-10; mild, moderate, severe)     9/10 5. ITCHING: Is there any itching? If Yes, ask: How bad is it? (Scale: 1-10; mild, moderate, severe)     Mild  6. CAUSE: What do you think is causing the discharge? Have you had the same problem before? What happened then?     Unsure  7. OTHER SYMPTOMS: Do you have any other symptoms? (e.g., fever, itching,  vaginal bleeding, pain with urination, injury to genital area, vaginal foreign body)     Reports UTI symptoms have worsened  Protocols used: Vaginal Symptoms-A-AH

## 2023-12-21 NOTE — Telephone Encounter (Signed)
 Copied from CRM [id number]. Topic: Clinical - Prescription Issue >> Dec 21, 2023  3:08 PM Willma R wrote: Reason for CRM: Patient was prescribed colchicine  0.6 MG tablet for breaking out around her mouth, was prescribed for 1 pill a day but dr advised to take 3 and now she does not have enough.   Patient can be reached at [phone]

## 2023-12-22 DIAGNOSIS — M79674 Pain in right toe(s): Secondary | ICD-10-CM | POA: Diagnosis not present

## 2023-12-22 DIAGNOSIS — B351 Tinea unguium: Secondary | ICD-10-CM | POA: Diagnosis not present

## 2023-12-22 DIAGNOSIS — L84 Corns and callosities: Secondary | ICD-10-CM | POA: Diagnosis not present

## 2023-12-22 DIAGNOSIS — M79675 Pain in left toe(s): Secondary | ICD-10-CM | POA: Diagnosis not present

## 2023-12-22 DIAGNOSIS — E1142 Type 2 diabetes mellitus with diabetic polyneuropathy: Secondary | ICD-10-CM | POA: Diagnosis not present

## 2023-12-22 NOTE — Telephone Encounter (Signed)
 Patient scheduled for office visit 12/23/2023

## 2023-12-22 NOTE — Telephone Encounter (Signed)
 Called and left message for patient to call and schedule appt

## 2023-12-23 ENCOUNTER — Encounter: Payer: Self-pay | Admitting: Family Medicine

## 2023-12-23 ENCOUNTER — Encounter: Payer: Self-pay | Admitting: Nurse Practitioner

## 2023-12-23 ENCOUNTER — Ambulatory Visit: Attending: Cardiovascular Disease | Admitting: Nurse Practitioner

## 2023-12-23 ENCOUNTER — Ambulatory Visit: Admitting: Family Medicine

## 2023-12-23 ENCOUNTER — Ambulatory Visit (INDEPENDENT_AMBULATORY_CARE_PROVIDER_SITE_OTHER): Admitting: Family Medicine

## 2023-12-23 VITALS — BP 139/79 | HR 70 | Temp 97.2°F

## 2023-12-23 DIAGNOSIS — R400 Somnolence: Secondary | ICD-10-CM

## 2023-12-23 DIAGNOSIS — R300 Dysuria: Secondary | ICD-10-CM | POA: Diagnosis not present

## 2023-12-23 DIAGNOSIS — Z01810 Encounter for preprocedural cardiovascular examination: Secondary | ICD-10-CM | POA: Diagnosis not present

## 2023-12-23 DIAGNOSIS — N898 Other specified noninflammatory disorders of vagina: Secondary | ICD-10-CM

## 2023-12-23 LAB — WET PREP FOR TRICH, YEAST, CLUE
Clue Cell Exam: NEGATIVE
Trichomonas Exam: NEGATIVE
Yeast Exam: NEGATIVE

## 2023-12-23 NOTE — Progress Notes (Signed)
 Virtual Visit via Telephone Note   Because of Amber Stephenson co-morbid illnesses, she is at least at moderate risk for complications without adequate follow up.  This format is felt to be most appropriate for this patient at this time.  Due to technical limitations with video connection (technology), today's appointment will be conducted as an audio only telehealth visit, and Amber Stephenson verbally agreed to proceed in this manner.   All issues noted in this document were discussed and addressed.  No physical exam could be performed with this format.  Evaluation Performed:  Preoperative cardiovascular risk assessment _____________   Date:  12/23/2023   Patient ID:  Amber Stephenson, DOB 27-Aug-1945, MRN [id number] Patient Location:  Home Provider location:   Office  Primary Care Provider:  [name], MD Primary Cardiologist:  Wilbert Bihari, MD  Chief Complaint / Patient Profile   78 y.o. y/o female with a h/o PAF on chronic anticoagulation, type 2 DM, OSA (intolerant to CPAP), HTN, HLD, nonobstructive CAD by cath in 2019, HFpEF, mildly dilated aortic root who is pending colonoscopy and EGD on date TBD with Dr. Shaaron and presents today for telephonic preoperative cardiovascular risk assessment.  History of Present Illness    Amber Stephenson is a 78 y.o. female who presents via audio/video conferencing for a telehealth visit today.  Pt was last seen in cardiology clinic on 08/27/23 by Dr. Bihari.  At that time Amber Stephenson was doing well.  The patient is now pending procedure as outlined above. Since her last visit, she reports waking up gasping and is currently undergoing evaluation for sleep apnea. She  chest pain, edema, fatigue, palpitations, melena, hematuria, hemoptysis, diaphoresis, presyncope, syncope, orthopnea. She is having some weakness and low blood pressure and was diagnosed with bladder infection. She is not able to achieve > 4 METS activity but underwent  cardiac PET CT on 09/23/23 that did not ischemia or prior infarct.   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   Benign paroxysmal positional vertigo due to bilateral vestibular disorder 05/20/2019   Bipolar 1 disorder (HCC) 01/23/2015   with GAD, benzo dependence   CAD (coronary artery disease)    Nonobstructive; Managed by Dr. Charls   Cardiomegaly 01/12/2018   CHF (congestive heart failure) 06/08/2022   Chronic back pain 01/04/2015   Chronic constipation 04/25/2020   Chronic diastolic heart failure (HCC) 05/30/2015   Chronic pain syndrome 08/22/2019   back pain, sacroiliitis   Chronic post-traumatic stress disorder (PTSD) 12/06/2020   Diabetic neuropathy (HCC) 02/06/2016   Dyslipidemia 09/24/2020   Essential hypertension    Functional diarrhea 10/26/2020   Herpes genitalis in women 07/16/2015   History of adenomatous polyp of colon 05/21/2019   Overview:   03/31/17: Colonoscopy: nonadvanced adenoma, microscopic colitis, f/u 5 yrs, [facility]/GAP   History of radiation therapy    Endometrial- 02/18/23-03/31/23-Dr. Lynwood Kinard   Hypotension 09/01/2022   Insomnia 01/23/2015   Metabolic acidosis 01/12/2023   Migraine headache with aura 02/12/2016   Myofascial pain dysfunction syndrome 08/22/2019   Non-alcoholic fatty liver disease 01/12/2018   OSA (obstructive sleep apnea) 02/24/2019   10/09/2018 - HST  - AHI 40.6    Osteopenia 12/06/2020  Rx alendronate  35 mg.   Pinguecula 12/06/2020   Presbyopia 12/06/2020   Pulmonary hypertension    RLS (restless legs syndrome) 04/27/2015   RSV (respiratory syncytial virus infection) 06/10/2023   Tremor, essential 12/11/2021   Type II diabetes  mellitus (HCC) 09/24/2020   Past Surgical History:  Procedure Laterality Date   BREAST REDUCTION SURGERY     COLONOSCOPY  2018   6 mm cecal tubular adenoma, sigmoid diverticulosis, random colon biopsies consistent with microscopic colitis   EYE SURGERY Right    cateracts   HAMMER TOE SURGERY     LEFT HEART CATH AND CORONARY ANGIOGRAPHY N/A 02/03/2018   Procedure: LEFT HEART CATH AND CORONARY ANGIOGRAPHY;  Surgeon: [name], Peter M, MD;  Location: [facility] INVASIVE CV LAB;  Service: Cardiovascular;  Laterality: N/A;   REVERSE SHOULDER ARTHROPLASTY Right 08/19/2019   Procedure: REVERSE SHOULDER ARTHROPLASTY;  Surgeon: Kay Kemps, MD;  Location: [facility] ORS;  Service: Orthopedics;  Laterality: Right;  interscalene block   SHOULDER SURGERY Right    I BROKE MY SHOUDLER   THIGH SURGERY     TO REMOVE A TUMOR     Allergies  Allergies  Allergen Reactions   Iodinated Contrast Media Anaphylaxis, Swelling and Other (See Comments)    Throat closes, swelling of throat and tongue   Iodine Anaphylaxis   Latex Other (See Comments)    Latex tape pulls skin with it   Tape Other (See Comments)    Pulls off the skin, if latex   Tizanidine  Other (See Comments)    Weakness, goofy, bad dreams    Home Medications    Prior to Admission medications   Medication Sig Start Date End Date Taking? Authorizing Provider  ALPRAZolam  (XANAX ) 1 MG tablet Take 1 mg by mouth. 12/21/23   [provider]  amoxicillin  (AMOXIL ) 500 MG tablet Take 500 mg by mouth 3 (three) times daily. 12/09/23   [provider]  ANUCORT-HC  25 MG suppository Place 25 mg rectally daily. 09/28/23   [provider]  ARIPiprazole  (ABILIFY ) 2 MG tablet Take 2 mg by mouth at bedtime. 10/07/21   [provider]  atorvastatin  (LIPITOR) 80 MG tablet Take 1 tablet (80 mg total) by mouth daily. 02/05/23 12/23/23  Lucien Orren SAILOR, PA-C  BELSOMRA  20 MG TABS Take 1 tablet by mouth at bedtime as needed. Patient not taking:  Reported on 12/23/2023 10/16/23   [provider]  carbidopa -levodopa  (SINEMET ) 10-100 MG tablet Take 1 tablet by mouth 4 (four) times daily. For restless leg syndrome 12/01/23   [name], MD  chlorhexidine  (PERIDEX ) 0.12 % solution Use as directed 5 mLs in the mouth or throat 2 (two) times daily. 12/09/23   [provider]  colchicine  0.6 MG tablet Take 1 tablet (0.6 mg total) by mouth 2 (two) times daily. For mouth sores Patient not taking: Reported on 12/23/2023 10/21/23   [name], MD  Continuous Glucose Sensor (DEXCOM G7 SENSOR) MISC CHANGE SENSOR EVERY 10 DAYS 09/28/23   Shamleffer, Donell Cardinal, MD  cyanocobalamin  (VITAMIN B12) 1000 MCG tablet Take 1 tablet (1,000 mcg total) by mouth daily. 09/04/22   Ghimire, Donalda HERO, MD  denosumab  (PROLIA ) 60 MG/ML SOSY injection Inject 60 mg into the skin every 6 (six) months. 08/14/20   [name], MD  diphenoxylate -atropine  (LOMOTIL ) 2.5-0.025 MG tablet TAKE 2 TABLETS BY MOUTH FOUR TIMES DAILY AS NEEDED FOR DIARRHEA OR LOOSE STOOLS 11/24/23   [name], MD  ELIQUIS  5 MG TABS tablet TAKE 1 TABLET TWICE  A DAY 11/02/23   Shlomo Wilbert SAUNDERS, MD  escitalopram  (LEXAPRO ) 10 MG tablet Take 10 mg by mouth daily.    [provider]  fluticasone -salmeterol (ADVAIR DISKUS) 500-50 MCG/ACT AEPB Inhale 1 puff into the lungs in the morning and at bedtime. 10/06/23   [name], MD  furosemide  (LASIX ) 40 MG tablet Take 40 mg by mouth daily. 09/18/23   [provider]  hydrochlorothiazide  (MICROZIDE ) 12.5 MG capsule Take 12.5 mg by mouth daily. 11/19/23   [provider]  hydrOXYzine  (VISTARIL ) 25 MG capsule Take 1 capsule (25 mg total) by mouth 3 (three) times daily. 12/01/23   [name], MD  insulin  glargine (LANTUS  SOLOSTAR) 100 UNIT/ML Solostar Pen Inject 30 Units into the skin 2 (two) times daily. 09/21/23   Shamleffer, Ibtehal Jaralla, MD  insulin  lispro (HUMALOG  KWIKPEN) 100 UNIT/ML KwikPen Max daily 45 units  05/22/23   Shamleffer, Ibtehal Jaralla, MD  Insulin  Pen Needle 29G X MISC 1 Device by Does not apply route daily in the afternoon. 05/22/23   Shamleffer, Ibtehal Jaralla, MD  LORazepam  (ATIVAN ) 0.5 MG tablet Take 0.5-1 mg by mouth in the morning, at noon, and at bedtime. Take 0.5 mg by mouth in the morning, 0.5 mg at lunch, and 0.5 mg at bedtime    [provider]  losartan  (COZAAR ) 25 MG tablet Take 25 mg by mouth daily. 02/14/23   [provider]  MAGNESIUM  PO Take 1 tablet by mouth daily.    [provider]  meclizine  (ANTIVERT ) 25 MG tablet Take 1 tablet (25 mg total) by mouth 3 (three) times daily as needed for dizziness. 06/05/23   Dettinger, Fonda LABOR, MD  metFORMIN  (GLUCOPHAGE -XR) 500 MG 24 hr tablet Take 2 tablets (1,000 mg total) by mouth daily with breakfast. 05/22/23   Shamleffer, Donell Cardinal, MD  Methocarbamol  1000 MG TABS Take 1,000 mg by mouth every 6 (six) hours as needed. 08/31/23   Lovorn, Megan, MD  metoprolol  tartrate (LOPRESSOR ) 50 MG tablet TAKE 1 TABLET TWICE A DAY 12/09/23   Shlomo Wilbert SAUNDERS, MD  omeprazole  (PRILOSEC) 40 MG capsule Take 1 capsule (40 mg total) by mouth 2 (two) times daily before a meal. 10/23/23   Rudy Josette RAMAN, PA-C  ondansetron  (ZOFRAN ) 4 MG tablet Take 1 tablet (4 mg total) by mouth every 8 (eight) hours as needed for nausea or vomiting. For cancer related nausea 10/23/23   Rudy Josette RAMAN, PA-C  OXcarbazepine  (TRILEPTAL ) 150 MG tablet Take 1 tablet (150 mg total) by mouth 2 (two) times daily. 06/13/22   Sherrill Cable Latif, DO  oxyCODONE  (OXYCONTIN ) 20 mg 12 hr tablet Take 1 tablet (20 mg total) by mouth every 12 (twelve) hours. 10/29/23   Debby Fidela CROME, NP  potassium chloride  (KLOR-CON ) 10 MEQ tablet Take 1 tablet (10 mEq total) by mouth daily. 02/05/23 12/23/23  Lucien Orren SAILOR, PA-C  rOPINIRole  (REQUIP ) 1 MG tablet Take 1 mg by mouth at bedtime.    [provider]  tirzepatide  (MOUNJARO ) 12.5 MG/0.5ML Pen Inject 12.5  mg into the skin once a week. 09/21/23   Shamleffer, Donell Cardinal, MD  Vitamin D , Cholecalciferol , 25 MCG (1000 UT) TABS Take 1,000 Units by mouth daily in the afternoon. 07/26/20   [provider]    Physical Exam    Vital Signs:  Amber Stephenson does not have vital signs available for review today.  Given telephonic nature of communication, physical exam is limited. AAOx3. NAD. Normal affect.  Speech and  respirations are unlabored.  Accessory Clinical Findings    None  Assessment & Plan    1.  Preoperative Cardiovascular Risk Assessment: According to the Revised Cardiac Risk Index (RCRI), her Perioperative Risk of Major Cardiac Event is (%): 0.9. Her Functional Capacity in METs is: 3.97 according to the Duke Activity Status Index (DASI). She had a cardiac PET CT 09/23/23 that was low risk with no evidence of infarct or ischemia. The patient is doing well from a cardiac perspective. Therefore, based on ACC/AHA guidelines, the patient would be at acceptable risk for the planned procedure without further cardiovascular testing.   The patient was advised that if she develops new symptoms prior to surgery to contact our office to arrange for a follow-up visit, and she verbalized understanding.  Per office protocol, patient can hold Eliquis  for 2 days prior to procedure.   Patient will not need bridging with Lovenox  (enoxaparin ) around procedure.  A copy of this note will be routed to requesting surgeon.  Time:   Today, I have spent 10 minutes with the patient with telehealth technology discussing medical history, symptoms, and management plan.     Amber EMERSON Bane, NP-C  12/23/2023, 3:25 PM [address] [place], [place] [address] Office [phone] Fax [phone]

## 2023-12-23 NOTE — Progress Notes (Signed)
 Subjective:  Patient ID: Amber Stephenson, female    DOB: 10-Sep-1945, 78 y.o.   MRN: [id number]  Patient Care Team: [name], MD as PCP - General (Family Medicine) Shlomo Wilbert SAUNDERS, MD as PCP - Cardiology (Cardiology) Kay Lynwood SQUIBB, MD as Referring Physician (Orthopedic Surgery) Tasia Lung, MD as Consulting Physician (Psychiatry) Georjean Darice HERO, MD as Consulting Physician (Neurology) Shamleffer, Donell Cardinal, MD as Consulting Physician (Endocrinology) Charmayne Molly, MD as Consulting Physician (Ophthalmology) Roddie Bring, DPM as Consulting Physician (Podiatry) Shlomo Wilbert SAUNDERS, MD as Consulting Physician (Cardiology) Shlomo Wilbert SAUNDERS, MD as Consulting Physician (Cardiology) Dina Camie FORBES DEVONNA (Neurology) Neda Jennet LABOR, MD as Consulting Physician (Pulmonary Disease) Parrett, Madelin RAMAN, NP as Nurse Practitioner (Pulmonary Disease) Bertrum Rosina HERO, RN as [facility] (General Practice) Bertrum Rosina HERO, RN   Chief Complaint:  Dysuria (X 1 week ago/)   HPI: Amber Stephenson is a 78 y.o. female presenting on 12/23/2023 for Dysuria (X 1 week ago/)   Amber Stephenson is a 77 year old female who presents with urinary symptoms and vaginal irritation.  She has been experiencing urinary symptoms including swelling, itching, burning, and increased frequency for over a week. She was prescribed Cipro , 500 mg twice a day, which she started on Monday. Despite this treatment, her symptoms have worsened.  She also reports vaginal swelling and irritation, which has become more bothersome. She describes difficulty sitting due to the discomfort and notes that both her urinary and vaginal symptoms are equally bothersome. Frequent wiping may contribute to the irritation. She experiences both internal and external vaginal pain.  Additionally, she reports rectal bleeding, which is being managed separately with ongoing tests by a GI specialist.  She has been experiencing  significant drowsiness after taking Xanax  for sleep, which she took at 9 PM the previous night. She did not sleep well and is still feeling very sleepy the following morning. She also reports dizziness when getting up from a lying position.        Relevant past medical, surgical, family, and social history reviewed and updated as indicated.  Allergies and medications reviewed and updated. Data reviewed: Chart in Epic.   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   Benign paroxysmal positional vertigo due to bilateral vestibular disorder 05/20/2019   Bipolar 1 disorder (HCC) 01/23/2015   with GAD, benzo dependence   CAD (coronary artery disease)    Nonobstructive; Managed by Dr. Charls   Cardiomegaly 01/12/2018   CHF (congestive heart failure) 06/08/2022   Chronic back pain 01/04/2015   Chronic constipation 04/25/2020   Chronic diastolic heart failure (HCC) 05/30/2015   Chronic pain syndrome 08/22/2019   back pain, sacroiliitis   Chronic post-traumatic stress disorder (PTSD) 12/06/2020   Diabetic neuropathy (HCC) 02/06/2016   Dyslipidemia 09/24/2020   Essential hypertension    Functional diarrhea 10/26/2020   Herpes genitalis in women 07/16/2015   History of adenomatous polyp of colon 05/21/2019   Overview:   03/31/17: Colonoscopy: nonadvanced adenoma, microscopic colitis, f/u 5 yrs, [facility]/GAP   History of radiation therapy    Endometrial- 02/18/23-03/31/23-Dr. Lynwood Kinard   Hypotension 09/01/2022   Insomnia 01/23/2015   Metabolic acidosis 01/12/2023   Migraine headache with aura 02/12/2016  Myofascial pain dysfunction syndrome 08/22/2019   Non-alcoholic fatty liver disease 01/12/2018   OSA  (obstructive sleep apnea) 02/24/2019   10/09/2018 - HST  - AHI 40.6    Osteopenia 12/06/2020   Rx alendronate  35 mg.   Pinguecula 12/06/2020   Presbyopia 12/06/2020   Pulmonary hypertension    RLS (restless legs syndrome) 04/27/2015   RSV (respiratory syncytial virus infection) 06/10/2023   Tremor, essential 12/11/2021   Type II diabetes mellitus (HCC) 09/24/2020    Past Surgical History:  Procedure Laterality Date   BREAST REDUCTION SURGERY     COLONOSCOPY  2018   6 mm cecal tubular adenoma, sigmoid diverticulosis, random colon biopsies consistent with microscopic colitis   EYE SURGERY Right    cateracts   HAMMER TOE SURGERY     LEFT HEART CATH AND CORONARY ANGIOGRAPHY N/A 02/03/2018   Procedure: LEFT HEART CATH AND CORONARY ANGIOGRAPHY;  Surgeon: [name], Peter M, MD;  Location: [facility] INVASIVE CV LAB;  Service: Cardiovascular;  Laterality: N/A;   REVERSE SHOULDER ARTHROPLASTY Right 08/19/2019   Procedure: REVERSE SHOULDER ARTHROPLASTY;  Surgeon: Kay Kemps, MD;  Location: [facility] ORS;  Service: Orthopedics;  Laterality: Right;  interscalene block   SHOULDER SURGERY Right    I BROKE MY SHOUDLER   THIGH SURGERY     TO REMOVE A TUMOR     Social History   Socioeconomic History   Marital status: Married    Spouse name: Dwight   Number of children: 3   Years of education: 15   Highest education level: Associate degree: academic program  Occupational History   Occupation: Retired    Comment: Chief Financial Officer  Tobacco Use   Smoking status: Never   Smokeless tobacco: Never  Vaping Use   Vaping status: Never Used  Substance and Sexual Activity   Alcohol  use: Not Currently    Alcohol /week: 0.0 standard drinks of alcohol    Drug use: No   Sexual activity: Yes    Partners: Male    Birth control/protection: Post-menopausal  Other Topics Concern   Not on file  Social History Narrative   Lives at home with husband.    They have 3 children who live away - [place] , [place] , and  [place] .   She is from [place]  and most of her family lives there.   Her husbands's family live nearby   Right handed.   Social Drivers of Corporate investment banker Strain: Low Risk  (08/13/2023)   Overall Financial Resource Strain (CARDIA)    Difficulty of Paying Living Expenses: Not hard at all  Food Insecurity: No Food Insecurity (12/18/2023)   Hunger Vital Sign    Worried About Running Out of Food in the Last Year: Never true    Ran Out of Food in the Last Year: Never true  Transportation Needs: No Transportation Needs (12/18/2023)   PRAPARE - Administrator, Civil Service (Medical): No    Lack of Transportation (Non-Medical): No  Physical Activity: Insufficiently Active (08/13/2023)   Exercise Vital Sign    Days of Exercise per Week: 3 days    Minutes of Exercise per Session: 20 min  Stress: No Stress Concern Present (08/13/2023)   [employer] of Occupational Health - Occupational Stress Questionnaire    Feeling of Stress : Only a little  Social Connections: Moderately Integrated (10/07/2023)   Social Connection and Isolation Panel    Frequency of Communication with Friends and Family: More than three times a week    Frequency of Social  Gatherings with Friends and Family: Once a week    Attends Religious Services: More than 4 times per year    Active Member of [employer] or Organizations: No    Attends Banker Meetings: Never    Marital Status: Married  Catering manager Violence: Not At Risk (12/18/2023)   Humiliation, Afraid, Rape, and Kick questionnaire    Fear of Current or Ex-Partner: No    Emotionally Abused: No    Physically Abused: No    Sexually Abused: No    Outpatient Encounter Medications as of 12/23/2023  Medication Sig   ALPRAZolam  (XANAX ) 1 MG tablet Take 1 mg by mouth.   amoxicillin  (AMOXIL ) 500 MG tablet Take 500 mg by mouth 3 (three) times daily.   ANUCORT-HC  25 MG suppository Place 25 mg rectally daily.   ARIPiprazole  (ABILIFY ) 2  MG tablet Take 2 mg by mouth at bedtime.   atorvastatin  (LIPITOR) 80 MG tablet Take 1 tablet (80 mg total) by mouth daily.   carbidopa -levodopa  (SINEMET ) 10-100 MG tablet Take 1 tablet by mouth 4 (four) times daily. For restless leg syndrome   chlorhexidine  (PERIDEX ) 0.12 % solution Use as directed 5 mLs in the mouth or throat 2 (two) times daily.   Continuous Glucose Sensor (DEXCOM G7 SENSOR) MISC CHANGE SENSOR EVERY 10 DAYS   cyanocobalamin  (VITAMIN B12) 1000 MCG tablet Take 1 tablet (1,000 mcg total) by mouth daily.   denosumab  (PROLIA ) 60 MG/ML SOSY injection Inject 60 mg into the skin every 6 (six) months.   diphenoxylate -atropine  (LOMOTIL ) 2.5-0.025 MG tablet TAKE 2 TABLETS BY MOUTH FOUR TIMES DAILY AS NEEDED FOR DIARRHEA OR LOOSE STOOLS   ELIQUIS  5 MG TABS tablet TAKE 1 TABLET TWICE A DAY   escitalopram  (LEXAPRO ) 10 MG tablet Take 10 mg by mouth daily.   fluticasone -salmeterol (ADVAIR DISKUS) 500-50 MCG/ACT AEPB Inhale 1 puff into the lungs in the morning and at bedtime.   furosemide  (LASIX ) 40 MG tablet Take 40 mg by mouth daily.   hydrochlorothiazide  (MICROZIDE ) 12.5 MG capsule Take 12.5 mg by mouth daily.   hydrOXYzine  (VISTARIL ) 25 MG capsule Take 1 capsule (25 mg total) by mouth 3 (three) times daily.   insulin  glargine (LANTUS  SOLOSTAR) 100 UNIT/ML Solostar Pen Inject 30 Units into the skin 2 (two) times daily.   insulin  lispro (HUMALOG  KWIKPEN) 100 UNIT/ML KwikPen Max daily 45 units   Insulin  Pen Needle 29G X MISC 1 Device by Does not apply route daily in the afternoon.   LORazepam  (ATIVAN ) 0.5 MG tablet Take 0.5-1 mg by mouth in the morning, at noon, and at bedtime. Take 0.5 mg by mouth in the morning, 0.5 mg at lunch, and 0.5 mg at bedtime   losartan  (COZAAR ) 25 MG tablet Take 25 mg by mouth daily.   MAGNESIUM  PO Take 1 tablet by mouth daily.   meclizine  (ANTIVERT ) 25 MG tablet Take 1 tablet (25 mg total) by mouth 3 (three) times daily as needed for dizziness.   metFORMIN   (GLUCOPHAGE -XR) 500 MG 24 hr tablet Take 2 tablets (1,000 mg total) by mouth daily with breakfast.   Methocarbamol  1000 MG TABS Take 1,000 mg by mouth every 6 (six) hours as needed.   metoprolol  tartrate (LOPRESSOR ) 50 MG tablet TAKE 1 TABLET TWICE A DAY   omeprazole  (PRILOSEC) 40 MG capsule Take 1 capsule (40 mg total) by mouth 2 (two) times daily before a meal.   ondansetron  (ZOFRAN ) 4 MG tablet Take 1 tablet (4 mg total) by mouth every 8 (  eight) hours as needed for nausea or vomiting. For cancer related nausea   OXcarbazepine  (TRILEPTAL ) 150 MG tablet Take 1 tablet (150 mg total) by mouth 2 (two) times daily.   oxyCODONE  (OXYCONTIN ) 20 mg 12 hr tablet Take 1 tablet (20 mg total) by mouth every 12 (twelve) hours.   potassium chloride  (KLOR-CON ) 10 MEQ tablet Take 1 tablet (10 mEq total) by mouth daily.   rOPINIRole  (REQUIP ) 1 MG tablet Take 1 mg by mouth at bedtime.   tirzepatide  (MOUNJARO ) 12.5 MG/0.5ML Pen Inject 12.5 mg into the skin once a week.   Vitamin D , Cholecalciferol , 25 MCG (1000 UT) TABS Take 1,000 Units by mouth daily in the afternoon.   BELSOMRA  20 MG TABS Take 1 tablet by mouth at bedtime as needed. (Patient not taking: Reported on 12/23/2023)   colchicine  0.6 MG tablet Take 1 tablet (0.6 mg total) by mouth 2 (two) times daily. For mouth sores (Patient not taking: Reported on 12/23/2023)   [DISCONTINUED] ciprofloxacin  (CIPRO ) 500 MG tablet Take 1 tablet (500 mg total) by mouth 2 (two) times daily.   [DISCONTINUED] ciprofloxacin -hydrocortisone  (CIPRO  HC OTIC) OTIC suspension Place 3 drops into both ears 2 (two) times daily. For one week   Facility-Administered Encounter Medications as of 12/23/2023  Medication   bupivacaine -epinephrine  (MARCAINE  W/ EPI) 0.5% -1:200000 injection   [START ON 01/11/2024] denosumab  (PROLIA ) injection 60 mg    Allergies  Allergen Reactions   Iodinated Contrast Media Anaphylaxis, Swelling and Other (See Comments)    Throat closes, swelling of throat  and tongue   Iodine Anaphylaxis   Latex Other (See Comments)    Latex tape pulls skin with it   Tape Other (See Comments)    Pulls off the skin, if latex   Tizanidine  Other (See Comments)    Weakness, goofy, bad dreams    Pertinent ROS per HPI, otherwise unremarkable      Objective:  BP 139/79   Pulse 70   Temp (!) 97.2 F (36.2 C)   SpO2 96%    Wt Readings from Last 3 Encounters:  12/18/23 170 lb (77.1 kg)  12/16/23 170 lb (77.1 kg)  12/07/23 177 lb (80.3 kg)    Physical Exam Vitals and nursing note reviewed. Exam conducted with a chaperone present.  Constitutional:      Appearance: She is obese.     Comments: Somnolent   HENT:     Head: Normocephalic and atraumatic.     Mouth/Throat:     Mouth: Mucous membranes are moist.  Eyes:     Conjunctiva/sclera: Conjunctivae normal.     Pupils: Pupils are equal, round, and reactive to light.  Cardiovascular:     Rate and Rhythm: Normal rate and regular rhythm.  Pulmonary:     Effort: Pulmonary effort is normal.     Breath sounds: Normal breath sounds.  Abdominal:     General: Abdomen is protuberant. Bowel sounds are normal.     Palpations: Abdomen is soft.     Tenderness: There is no abdominal tenderness.     Hernia: There is no hernia in the left inguinal area or right inguinal area.  Genitourinary:    Exam position: Lithotomy position.     Pubic Area: No rash or pubic lice.      Tanner stage (genital): 5.     Labia:        Right: Tenderness present.        Left: Tenderness present.      Urethra: No prolapse,  urethral pain, urethral swelling or urethral lesion.     Vagina: No vaginal discharge or erythema.     Comments: Labial irritation with redness, no open wounds or discharge. No satellite lesions. Musculoskeletal:     Cervical back: Neck supple.     Right lower leg: No edema.     Left lower leg: No edema.  Lymphadenopathy:     Lower Body: No right inguinal adenopathy. No left inguinal adenopathy.  Skin:     General: Skin is warm and dry.     Capillary Refill: Capillary refill takes less than 2 seconds.  Neurological:     Mental Status: She is easily aroused. She is lethargic.     Motor: Weakness (generalized) present.     Gait: Gait abnormal (in wheelchair).  Psychiatric:        Attention and Perception: She is inattentive.        Mood and Affect: Mood normal.        Speech: Speech is delayed.        Behavior: Behavior is slowed.        Thought Content: Thought content normal.        Judgment: Judgment normal.    Physical Exam   GENITOURINARY: No significant vaginal discharge or swelling. Mild external vaginal irritation.        Results for orders placed or performed during the hospital encounter of 12/18/23  Urinalysis, w/ Reflex to Culture (Infection Suspected) -Urine, Clean Catch   Collection Time: 12/18/23  4:53 PM  Result Value Ref Range   Specimen Source URINE, CLEAN CATCH    Color, Urine STRAW (A) YELLOW   APPearance CLEAR CLEAR   Specific Gravity, Urine 1.009 1.005 - 1.030   pH 7.0 5.0 - 8.0   Glucose, UA NEGATIVE NEGATIVE mg/dL   Hgb urine dipstick NEGATIVE NEGATIVE   Bilirubin Urine NEGATIVE NEGATIVE   Ketones, ur NEGATIVE NEGATIVE mg/dL   Protein, ur 30 (A) NEGATIVE mg/dL   Nitrite NEGATIVE NEGATIVE   Leukocytes,Ua NEGATIVE NEGATIVE   RBC / HPF 0-5 0 - 5 RBC/hpf   WBC, UA 0-5 0 - 5 WBC/hpf   Bacteria, UA NONE SEEN NONE SEEN   Squamous Epithelial / HPF 0-5 0 - 5 /HPF  Type and screen [facility]   Collection Time: 12/18/23  4:53 PM  Result Value Ref Range   ABO/RH(D) O POS    Antibody Screen NEG    Sample Expiration      12/21/2023,2359 Performed at [facility], [address], [place], [place] [address]   Comprehensive metabolic panel   Collection Time: 12/18/23  5:14 PM  Result Value Ref Range   Sodium 131 (L) 135 - 145 mmol/L   Potassium 4.0 3.5 - 5.1 mmol/L   Chloride 95 (L) 98 - 111 mmol/L   CO2 23 22 - 32 mmol/L   Glucose, Bld 128 (H) 70  - 99 mg/dL   BUN 10 8 - 23 mg/dL   Creatinine, Ser 9.42 0.44 - 1.00 mg/dL   Calcium  9.2 8.9 - 10.3 mg/dL   Total Protein 7.1 6.5 - 8.1 g/dL   Albumin  3.5 3.5 - 5.0 g/dL   AST 24 15 - 41 U/L   ALT 11 0 - 44 U/L   Alkaline Phosphatase 88 38 - 126 U/L   Total Bilirubin 0.7 0.0 - 1.2 mg/dL   GFR, Estimated >39 >39 mL/min   Anion gap 13 5 - 15  CBC with Differential   Collection Time:  12/18/23  5:14 PM  Result Value Ref Range   WBC 4.6 4.0 - 10.5 K/uL   RBC 3.71 (L) 3.87 - 5.11 MIL/uL   Hemoglobin 10.2 (L) 12.0 - 15.0 g/dL   HCT 67.6 (L) 63.9 - 53.9 %   MCV 87.1 80.0 - 100.0 fL   MCH 27.5 26.0 - 34.0 pg   MCHC 31.6 30.0 - 36.0 g/dL   RDW 83.8 (H) 88.4 - 84.4 %   Platelets 237 150 - 400 K/uL   nRBC 0.0 0.0 - 0.2 %   Neutrophils Relative % 72 %   Neutro Abs 3.4 1.7 - 7.7 K/uL   Lymphocytes Relative 11 %   Lymphs Abs 0.5 (L) 0.7 - 4.0 K/uL   Monocytes Relative 13 %   Monocytes Absolute 0.6 0.1 - 1.0 K/uL   Eosinophils Relative 2 %   Eosinophils Absolute 0.1 0.0 - 0.5 K/uL   Basophils Relative 1 %   Basophils Absolute 0.0 0.0 - 0.1 K/uL   Immature Granulocytes 1 %   Abs Immature Granulocytes 0.03 0.00 - 0.07 K/uL  Lipase, blood   Collection Time: 12/18/23  5:14 PM  Result Value Ref Range   Lipase 25 11 - 51 U/L  Troponin I (High Sensitivity)   Collection Time: 12/18/23  5:14 PM  Result Value Ref Range   Troponin I (High Sensitivity) 8 <18 ng/L  Troponin I (High Sensitivity)   Collection Time: 12/18/23  7:20 PM  Result Value Ref Range   Troponin I (High Sensitivity) 8 <18 ng/L   *Note: Due to a large number of results and/or encounters for the requested time period, some results have not been displayed. A complete set of results can be found in Results Review.       Pertinent labs & imaging results that were available during my care of the patient were reviewed by me and considered in my medical decision making.  Assessment & Plan:  Amber Stephenson was seen today for  dysuria.  Diagnoses and all orders for this visit:  Dysuria -     Urinalysis, Routine w reflex microscopic -     Urine Culture  Vaginal irritation -     WET PREP FOR TRICH, YEAST, CLUE  Somnolence Follow up with PCP pertaining to adjusting medications.        Urinary Tract Infection (UTI) Experiencing urinary symptoms including swelling, itching, burning, and frequency for over a week. A urine culture confirmed infection. Ciprofloxacin  was prescribed, which is appropriate for the infection. Reports some improvement in urinary symptoms with antibiotic treatment. - Continue Ciprofloxacin  as prescribed  Vaginal Irritation Reports vaginal swelling and pain, making it difficult to sit. A wet prep showed no yeast or bacterial infection, indicating irritation likely due to frequent wiping and possibly related to the UTI. The skin appears raw and irritated externally, but no significant discharge or swelling. Irritation could be exacerbated by frequent urination and wiping due to the UTI. - Apply Aquaphor or CeraVe healing ointment to the irritated area to protect the skin and aid healing - Consider using vaginal moisturizers such as Lofena and Refresh - Re-evaluate if symptoms do not improve with topical treatments  Sedation from Xanax  Prescribed Xanax  for sleep issues but reports excessive drowsiness the following day, indicating the current dose may be too high. She is on 1 mg, which is not the lowest dose available. Excessive sedation poses a risk of falls and other issues. - Consider reducing the dose of Xanax  or switching to an  alternative sleep aid to prevent excessive sedation and reduce fall risk         Follow up plan: Return if symptoms worsen or fail to improve.   Continue healthy lifestyle choices, including diet (rich in fruits, vegetables, and lean proteins, and low in salt and simple carbohydrates) and exercise (at least 30 minutes of moderate physical activity  daily).  Educational handout given for vaginal irritation  The above assessment and management plan was discussed with the patient. The patient verbalized understanding of and has agreed to the management plan. Patient is aware to call the clinic if they develop any new symptoms or if symptoms persist or worsen. Patient is aware when to return to the clinic for a follow-up visit. Patient educated on when it is appropriate to go to the emergency department.   Amber Bruns, FNP-C [facility] [phone]

## 2023-12-23 NOTE — Patient Instructions (Addendum)
 CeraVe Healing Ointment or Aquaphor - external  Luvena or RePHresh - internal

## 2023-12-24 ENCOUNTER — Ambulatory Visit: Payer: Self-pay | Admitting: Family Medicine

## 2023-12-24 ENCOUNTER — Ambulatory Visit: Payer: Self-pay

## 2023-12-24 NOTE — Telephone Encounter (Signed)
 FYI Only or Action Required?: Action required by provider: request for appointment.  Patient was last seen in primary care on 12/23/2023 by Severa Rock HERO, FNP.  Called Nurse Triage reporting Fall.  Symptoms began several days ago.  Interventions attempted: Rest, hydration, or home remedies.  Symptoms are: unchanged.  Triage Disposition: See PCP When Office is Open (Within 3 Days)  Patient/caregiver understands and will follow disposition?: YesCopied from CRM #[id number]. Topic: Clinical - Red Word Triage >> Dec 24, 2023  9:28 AM Laurier BROCKS wrote: Red Word that prompted transfer to Nurse Triage: Patient states she fell last week and is bruised up pretty bad. She is rating her pain at a level 8 Bruising on left cheek and right shin Reason for Disposition  MILD weakness (e.g., does not interfere with ability to work, go to school, normal activities)  (Exception: Mild weakness is a chronic symptom.)  Answer Assessment - Initial Assessment Questions 1. MECHANISM: How did the fall happen?     Slipped in bathroom  2. DOMESTIC VIOLENCE AND ELDER ABUSE SCREENING: Did you fall because someone pushed you or tried to hurt you? If Yes, ask: Are you safe now?     Na  3. ONSET: When did the fall happen? (e.g., minutes, hours, or days ago)     4 days ago  4. LOCATION: What part of the body hit the ground? (e.g., back, buttocks, head, hips, knees, hands, head, stomach)     Left cheek and right shin.  5. INJURY: Did you hurt (injure) yourself when you fell? If Yes, ask: What did you injure? Tell me more about this? (e.g., body area; type of injury; pain severity)     yes 6. PAIN: Is there any pain? If Yes, ask: How bad is the pain? (e.g., Scale 0-10; or none, mild,      8 7. SIZE: For cuts, bruises, or swelling, ask: How large is it? (e.g., inches or centimeters)      Bruise on bottom-silver dollar; shin-bruising  9. OTHER SYMPTOMS: Do you have any other symptoms? (e.g.,  dizziness, fever, weakness; new-onset or worsening).      denies 10. CAUSE: What do you think caused the fall (or falling)? (e.g., dizzy spell, tripped)       Water    Pt stated she slipped on water  4 days ago. Pt was seen in office yesterday but didn't mention fall or bruises. When questioned why pt stated,  I was there yesterday? Hmm I guess I had other important things to discuss. Pt has bandage on right shin. Pt has soonest appt with DOD tomorrow at [phone].  Protocols used: Falls and [facility]

## 2023-12-24 NOTE — Telephone Encounter (Signed)
 Appt made

## 2023-12-24 NOTE — Telephone Encounter (Signed)
 LMTCB

## 2023-12-25 ENCOUNTER — Encounter: Payer: Self-pay | Admitting: Family Medicine

## 2023-12-25 ENCOUNTER — Ambulatory Visit: Admitting: Family Medicine

## 2023-12-25 ENCOUNTER — Ambulatory Visit: Payer: Self-pay

## 2023-12-25 ENCOUNTER — Encounter (HOSPITAL_COMMUNITY): Payer: Self-pay | Admitting: Emergency Medicine

## 2023-12-25 ENCOUNTER — Emergency Department (HOSPITAL_COMMUNITY)
Admission: EM | Admit: 2023-12-25 | Discharge: 2023-12-25 | Disposition: A | Discharge status: Home / Self Care | Attending: Emergency Medicine | Admitting: Emergency Medicine

## 2023-12-25 ENCOUNTER — Emergency Department (HOSPITAL_COMMUNITY)

## 2023-12-25 ENCOUNTER — Other Ambulatory Visit: Payer: Self-pay

## 2023-12-25 VITALS — BP 133/85 | HR 79 | Ht 62.0 in | Wt 176.0 lb

## 2023-12-25 DIAGNOSIS — K5730 Diverticulosis of large intestine without perforation or abscess without bleeding: Secondary | ICD-10-CM | POA: Diagnosis not present

## 2023-12-25 DIAGNOSIS — Z794 Long term (current) use of insulin: Secondary | ICD-10-CM | POA: Diagnosis not present

## 2023-12-25 DIAGNOSIS — I5032 Chronic diastolic (congestive) heart failure: Secondary | ICD-10-CM | POA: Diagnosis not present

## 2023-12-25 DIAGNOSIS — J45909 Unspecified asthma, uncomplicated: Secondary | ICD-10-CM | POA: Insufficient documentation

## 2023-12-25 DIAGNOSIS — L89321 Pressure ulcer of left buttock, stage 1: Secondary | ICD-10-CM

## 2023-12-25 DIAGNOSIS — Z7951 Long term (current) use of inhaled steroids: Secondary | ICD-10-CM | POA: Diagnosis not present

## 2023-12-25 DIAGNOSIS — Z7901 Long term (current) use of anticoagulants: Secondary | ICD-10-CM | POA: Insufficient documentation

## 2023-12-25 DIAGNOSIS — R296 Repeated falls: Secondary | ICD-10-CM | POA: Diagnosis not present

## 2023-12-25 DIAGNOSIS — Z79899 Other long term (current) drug therapy: Secondary | ICD-10-CM | POA: Diagnosis not present

## 2023-12-25 DIAGNOSIS — Z7984 Long term (current) use of oral hypoglycemic drugs: Secondary | ICD-10-CM | POA: Diagnosis not present

## 2023-12-25 DIAGNOSIS — Z955 Presence of coronary angioplasty implant and graft: Secondary | ICD-10-CM | POA: Diagnosis not present

## 2023-12-25 DIAGNOSIS — I110 Hypertensive heart disease with heart failure: Secondary | ICD-10-CM | POA: Diagnosis not present

## 2023-12-25 DIAGNOSIS — Z91040 Latex allergy status: Secondary | ICD-10-CM | POA: Insufficient documentation

## 2023-12-25 DIAGNOSIS — R109 Unspecified abdominal pain: Secondary | ICD-10-CM | POA: Diagnosis not present

## 2023-12-25 DIAGNOSIS — Z8542 Personal history of malignant neoplasm of other parts of uterus: Secondary | ICD-10-CM | POA: Diagnosis not present

## 2023-12-25 DIAGNOSIS — R29898 Other symptoms and signs involving the musculoskeletal system: Secondary | ICD-10-CM | POA: Diagnosis not present

## 2023-12-25 DIAGNOSIS — E11649 Type 2 diabetes mellitus with hypoglycemia without coma: Secondary | ICD-10-CM | POA: Insufficient documentation

## 2023-12-25 DIAGNOSIS — R1030 Lower abdominal pain, unspecified: Secondary | ICD-10-CM | POA: Diagnosis present

## 2023-12-25 DIAGNOSIS — E1142 Type 2 diabetes mellitus with diabetic polyneuropathy: Secondary | ICD-10-CM | POA: Insufficient documentation

## 2023-12-25 DIAGNOSIS — Z711 Person with feared health complaint in whom no diagnosis is made: Secondary | ICD-10-CM

## 2023-12-25 DIAGNOSIS — I2510 Atherosclerotic heart disease of native coronary artery without angina pectoris: Secondary | ICD-10-CM | POA: Insufficient documentation

## 2023-12-25 DIAGNOSIS — K5792 Diverticulitis of intestine, part unspecified, without perforation or abscess without bleeding: Secondary | ICD-10-CM | POA: Diagnosis not present

## 2023-12-25 DIAGNOSIS — K5732 Diverticulitis of large intestine without perforation or abscess without bleeding: Secondary | ICD-10-CM | POA: Diagnosis not present

## 2023-12-25 LAB — COMPREHENSIVE METABOLIC PANEL WITH GFR
ALT: 16 U/L (ref 0–44)
AST: 25 U/L (ref 15–41)
Albumin: 3.2 g/dL — ABNORMAL LOW (ref 3.5–5.0)
Alkaline Phosphatase: 72 U/L (ref 38–126)
Anion gap: 11 (ref 5–15)
BUN: 16 mg/dL (ref 8–23)
CO2: 21 mmol/L — ABNORMAL LOW (ref 22–32)
Calcium: 8.8 mg/dL — ABNORMAL LOW (ref 8.9–10.3)
Chloride: 96 mmol/L — ABNORMAL LOW (ref 98–111)
Creatinine, Ser: 0.75 mg/dL (ref 0.44–1.00)
GFR, Estimated: >60 mL/min (ref >60)
Glucose, Bld: 116 mg/dL — ABNORMAL HIGH (ref 70–99)
Potassium: 4.3 mmol/L (ref 3.5–5.1)
Sodium: 128 mmol/L — ABNORMAL LOW (ref 135–145)
Total Bilirubin: 0.6 mg/dL (ref 0.0–1.2)
Total Protein: 6.1 g/dL — ABNORMAL LOW (ref 6.5–8.1)

## 2023-12-25 LAB — CBC WITH DIFFERENTIAL/PLATELET
Abs Immature Granulocytes: 0.02 K/uL (ref 0.00–0.07)
Basophils Absolute: 0 K/uL (ref 0.0–0.1)
Basophils Relative: 1 %
Eosinophils Absolute: 0.1 K/uL (ref 0.0–0.5)
Eosinophils Relative: 2 %
HCT: 29.5 % — ABNORMAL LOW (ref 36.0–46.0)
Hemoglobin: 9.3 g/dL — ABNORMAL LOW (ref 12.0–15.0)
Immature Granulocytes: 1 %
Lymphocytes Relative: 15 %
Lymphs Abs: 0.6 K/uL — ABNORMAL LOW (ref 0.7–4.0)
MCH: 27.5 pg (ref 26.0–34.0)
MCHC: 31.5 g/dL (ref 30.0–36.0)
MCV: 87.3 fL (ref 80.0–100.0)
Monocytes Absolute: 0.6 K/uL (ref 0.1–1.0)
Monocytes Relative: 15 %
Neutro Abs: 2.7 K/uL (ref 1.7–7.7)
Neutrophils Relative %: 66 %
Platelets: 190 K/uL (ref 150–400)
RBC: 3.38 MIL/uL — ABNORMAL LOW (ref 3.87–5.11)
RDW: 16.1 % — ABNORMAL HIGH (ref 11.5–15.5)
WBC: 4 K/uL (ref 4.0–10.5)
nRBC: 0 % (ref 0.0–0.2)

## 2023-12-25 LAB — URINALYSIS, W/ REFLEX TO CULTURE (INFECTION SUSPECTED)
Bacteria, UA: NONE SEEN
Bilirubin Urine: NEGATIVE
Glucose, UA: NEGATIVE mg/dL
Hgb urine dipstick: NEGATIVE
Ketones, ur: NEGATIVE mg/dL
Nitrite: NEGATIVE
Protein, ur: 30 mg/dL — AB
Specific Gravity, Urine: 1.01 (ref 1.005–1.030)
pH: 6 (ref 5.0–8.0)

## 2023-12-25 LAB — CBG MONITORING, ED
Glucose-Capillary: 144 mg/dL — ABNORMAL HIGH (ref 70–99)
Glucose-Capillary: 86 mg/dL (ref 70–99)

## 2023-12-25 LAB — LIPASE, BLOOD: Lipase: 25 U/L (ref 11–51)

## 2023-12-25 MED ORDER — LACTATED RINGERS IV BOLUS
1000.0000 mL | Freq: Once | INTRAVENOUS | Status: AC
  Administered 2023-12-25: 1000 mL via INTRAVENOUS

## 2023-12-25 MED ORDER — AMOXICILLIN-POT CLAVULANATE 875-125 MG PO TABS: 1.0000 | ORAL_TABLET | Freq: Two times a day (BID) | ORAL | 0 refills | Status: DC

## 2023-12-25 MED ORDER — HYDROMORPHONE HCL 1 MG/ML IJ SOLN
1.0000 mg | Freq: Once | INTRAMUSCULAR | Status: AC
  Administered 2023-12-25: 1 mg via INTRAVENOUS
  Filled 2023-12-25: qty 1

## 2023-12-25 MED ORDER — ONDANSETRON HCL 4 MG/2ML IJ SOLN
4.0000 mg | Freq: Once | INTRAMUSCULAR | Status: AC
  Administered 2023-12-25: 4 mg via INTRAVENOUS
  Filled 2023-12-25: qty 2

## 2023-12-25 NOTE — Patient Instructions (Signed)
-   Apply Aquaphor or CeraVe healing ointment to the irritated area to protect the skin and aid healing - Consider using vaginal moisturizers such as Lofena and Refresh

## 2023-12-25 NOTE — ED Provider Notes (Signed)
 [place] EMERGENCY DEPARTMENT AT [facility] Provider Note  CSN: [id number] Arrival date & time: 12/25/23 1727  Chief Complaint(s) Hypoglycemia  HPI Amber Stephenson is a 78 y.o. female history of atrial fibrillation, bipolar disorder, coronary artery disease, CHF, diabetes, hypertension, hyperlipidemia, presenting for low blood sugar.  Patient reports that she put on any Dexcom today, noticed that it was reading in the 50s.  She was feeling weak and came to the ER.  When checked in triage it was 144 and did not correlate.  She also reports that she has been having lower abdominal pain over past week.  Was recently treated for UTI and is still on antibiotics.  Also reports some nausea.  No vomiting.  No fevers or chills.  No chest pain or shortness of breath.  Reports some low back pain after fall, came to the emergency department for the fall and had testing performed.  Denies any cough.   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   Benign paroxysmal positional vertigo due to bilateral vestibular disorder 05/20/2019   Bipolar 1 disorder (HCC) 01/23/2015   with GAD, benzo dependence   CAD (coronary artery disease)    Nonobstructive; Managed by Dr. Charls   Cardiomegaly 01/12/2018   CHF (congestive heart failure) 06/08/2022   Chronic back pain 01/04/2015   Chronic constipation 04/25/2020   Chronic diastolic heart failure (HCC) 05/30/2015   Chronic pain syndrome 08/22/2019   back pain, sacroiliitis   Chronic post-traumatic stress disorder (PTSD) 12/06/2020   Diabetic neuropathy (HCC) 02/06/2016   Dyslipidemia 09/24/2020   Essential hypertension    Functional diarrhea  10/26/2020   Herpes genitalis in women 07/16/2015   History of adenomatous polyp of colon 05/21/2019   Overview:   03/31/17: Colonoscopy: nonadvanced adenoma, microscopic colitis, f/u 5 yrs, [facility]/GAP   History of radiation therapy    Endometrial- 02/18/23-03/31/23-Dr. Lynwood Kinard   Hypotension 09/01/2022   Insomnia 01/23/2015   Metabolic acidosis 01/12/2023   Migraine headache with aura 02/12/2016   Myofascial pain dysfunction syndrome 08/22/2019   Non-alcoholic fatty liver disease 01/12/2018   OSA (obstructive sleep apnea) 02/24/2019   10/09/2018 - HST  - AHI 40.6    Osteopenia 12/06/2020   Rx alendronate  35 mg.   Pinguecula 12/06/2020   Presbyopia 12/06/2020   Pulmonary hypertension    RLS (restless legs syndrome) 04/27/2015   RSV (respiratory syncytial virus infection) 06/10/2023   Tremor, essential 12/11/2021   Type II diabetes mellitus (HCC) 09/24/2020   Patient Active Problem List   Diagnosis Date Noted   Rectal bleeding 10/07/2023   Nocturnal enuresis 08/25/2023   Hyponatremia 06/10/2023   Abdominal pain 06/10/2023   Candidiasis of mouth 05/15/2023   Type 2 diabetes mellitus with hyperglycemia, with long-term current use of insulin  (HCC) 02/12/2023   Long term (current) use of oral hypoglycemic drugs 02/12/2023   Primary hypertension 01/14/2023   Endometrial cancer (HCC) 01/12/2023   Vulvar itching 12/22/2022   Burning with urination 11/13/2022   Vitamin B12 deficiency 11/09/2022   Frequent falls 09/01/2022   Hemorrhoids 03/11/2022   Microalbuminuria 01/21/2022   Tremor, essential 12/11/2021   Chronic bilateral low back pain with right-sided sciatica 07/19/2021   Vaginal itching 04/16/2021   Sensory  ataxia 03/28/2021   Elevated troponin 12/14/2020   Chronic post-traumatic stress disorder (PTSD) 12/06/2020   Senile corneal changes 12/06/2020   Postmenopausal bleeding 12/06/2020   Acquired hammer toe 12/06/2020   Opioid dependence 12/06/2020   Hypermetropia  12/06/2020   Generalized weakness 12/06/2020   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   Generalized anxiety disorder 12/06/2020   Functional diarrhea 10/26/2020   Insulin  dependent type 2 diabetes mellitus (HCC) 09/24/2020   Chronic respiratory failure with hypoxia (HCC) 04/25/2020   Chronic constipation 04/25/2020   CAD (coronary artery disease)    Hypocalcemia    S/P shoulder replacement, 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   Mixed hyperlipidemia 05/20/2019   Vertigo 04/25/2019   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   OSA (obstructive sleep apnea) 02/24/2019   At risk for adverse drug event 07/06/2018   Non-alcoholic fatty liver disease 01/12/2018   Mild intermittent asthma 01/12/2018   Senile osteoporosis 12/15/2017   Atrial fibrillation with RVR (HCC) 05/19/2017   Migraine headache with aura 02/12/2016   Diabetic neuropathy (HCC) 02/06/2016   Bipolar 1 disorder (HCC) 10/04/2015   Major depressive disorder 10/03/2015   Herpes genitalis in women 07/16/2015   Long term current use of anticoagulant 05/30/2015   Chronic heart failure with preserved ejection fraction (HFpEF) (HCC) 05/30/2015   RLS (restless legs syndrome) 04/27/2015   Insomnia 01/23/2015   Chronic atrial fibrillation with RVR (HCC) 01/04/2015   Home Medication(s) Prior to Admission medications   Medication Sig Start Date End Date Taking? Authorizing Provider  amoxicillin -clavulanate (AUGMENTIN ) 875-125 MG tablet Take 1 tablet by mouth every 12 (twelve) hours. 12/25/23  Yes Francesca Elsie CROME, MD  ALPRAZolam  (XANAX ) 1 MG tablet Take 1 mg by mouth. 12/21/23   [provider]  ANUCORT-HC  25 MG suppository Place 25 mg rectally daily. 09/28/23   [provider]  ARIPiprazole  (ABILIFY ) 2 MG tablet Take 2 mg by mouth at bedtime. 10/07/21   [provider]  atorvastatin  (LIPITOR) 80 MG tablet Take 1 tablet (80 mg total) by mouth daily. 02/05/23 12/25/23  Conte, Tessa N, PA-C  carbidopa -levodopa  (SINEMET )  10-100 MG tablet Take 1 tablet by mouth 4 (four) times daily. For restless leg syndrome 12/01/23   [name], MD  chlorhexidine  (PERIDEX ) 0.12 % solution Use as directed 5 mLs in the mouth or throat 2 (two) times daily. 12/09/23   [provider]  Continuous Glucose Sensor (DEXCOM G7 SENSOR) MISC CHANGE SENSOR EVERY 10 DAYS 09/28/23   Shamleffer, Ibtehal Jaralla, MD  cyanocobalamin  (VITAMIN B12) 1000 MCG tablet Take 1 tablet (1,000 mcg total) by mouth daily. 09/04/22   Ghimire, Donalda HERO, MD  denosumab  (PROLIA ) 60 MG/ML SOSY injection Inject 60 mg into the skin every 6 (six) months. 08/14/20   [name], MD  diphenoxylate -atropine  (LOMOTIL ) 2.5-0.025 MG tablet TAKE 2 TABLETS BY MOUTH FOUR TIMES DAILY AS NEEDED FOR DIARRHEA OR LOOSE STOOLS 11/24/23   [name], MD  ELIQUIS  5 MG TABS tablet TAKE 1 TABLET TWICE A DAY 11/02/23   Shlomo Wilbert SAUNDERS, MD  escitalopram  (LEXAPRO ) 10 MG tablet Take 10 mg by mouth daily.    [provider]  fluticasone -salmeterol (ADVAIR DISKUS) 500-50 MCG/ACT AEPB Inhale 1 puff into the lungs in the morning and at bedtime. 10/06/23   [name], MD  furosemide  (LASIX ) 40 MG tablet Take 40 mg by mouth daily. 09/18/23   [provider]  hydrochlorothiazide  (MICROZIDE ) 12.5 MG capsule Take 12.5 mg by mouth daily. 11/19/23   [provider]  hydrOXYzine  (VISTARIL ) 25 MG  capsule Take 1 capsule (25 mg total) by mouth 3 (three) times daily. 12/01/23   [name], MD  insulin  glargine (LANTUS  SOLOSTAR) 100 UNIT/ML Solostar Pen Inject 30 Units into the skin 2 (two) times daily. 09/21/23   Shamleffer, Ibtehal Jaralla, MD  insulin  lispro (HUMALOG  KWIKPEN) 100 UNIT/ML KwikPen Max daily 45 units 05/22/23   Shamleffer, Ibtehal Jaralla, MD  Insulin  Pen Needle 29G X MISC 1 Device by Does not apply route daily in the afternoon. 05/22/23   Shamleffer, Ibtehal Jaralla, MD  LORazepam  (ATIVAN ) 0.5 MG tablet Take 0.5-1 mg by mouth in the morning, at noon,  and at bedtime. Take 0.5 mg by mouth in the morning, 0.5 mg at lunch, and 0.5 mg at bedtime    [provider]  losartan  (COZAAR ) 25 MG tablet Take 25 mg by mouth daily. 02/14/23   [provider]  MAGNESIUM  PO Take 1 tablet by mouth daily.    [provider]  meclizine  (ANTIVERT ) 25 MG tablet Take 1 tablet (25 mg total) by mouth 3 (three) times daily as needed for dizziness. 06/05/23   Dettinger, Fonda LABOR, MD  metFORMIN  (GLUCOPHAGE -XR) 500 MG 24 hr tablet Take 2 tablets (1,000 mg total) by mouth daily with breakfast. 05/22/23   Shamleffer, Donell Cardinal, MD  Methocarbamol  1000 MG TABS Take 1,000 mg by mouth every 6 (six) hours as needed. 08/31/23   Lovorn, Megan, MD  metoprolol  tartrate (LOPRESSOR ) 50 MG tablet TAKE 1 TABLET TWICE A DAY 12/09/23   Shlomo Wilbert SAUNDERS, MD  omeprazole  (PRILOSEC) 40 MG capsule Take 1 capsule (40 mg total) by mouth 2 (two) times daily before a meal. 10/23/23   Rudy Josette RAMAN, PA-C  ondansetron  (ZOFRAN ) 4 MG tablet Take 1 tablet (4 mg total) by mouth every 8 (eight) hours as needed for nausea or vomiting. For cancer related nausea 10/23/23   Rudy Josette RAMAN, PA-C  OXcarbazepine  (TRILEPTAL ) 150 MG tablet Take 1 tablet (150 mg total) by mouth 2 (two) times daily. 06/13/22   Sherrill Cable Latif, DO  oxyCODONE  (OXYCONTIN ) 20 mg 12 hr tablet Take 1 tablet (20 mg total) by mouth every 12 (twelve) hours. 10/29/23   Debby Fidela CROME, NP  potassium chloride  (KLOR-CON ) 10 MEQ tablet Take 1 tablet (10 mEq total) by mouth daily. 02/05/23 12/25/23  Lucien Orren SAILOR, PA-C  rOPINIRole  (REQUIP ) 1 MG tablet Take 1 mg by mouth at bedtime.    [provider]  tirzepatide  (MOUNJARO ) 12.5 MG/0.5ML Pen Inject 12.5 mg into the skin once a week. 09/21/23   Shamleffer, Donell Cardinal, MD  Vitamin D , Cholecalciferol , 25 MCG (1000 UT) TABS Take 1,000 Units by mouth daily in the afternoon. 07/26/20   [provider]                                                                                                                                     Past Surgical  History Past Surgical History:  Procedure Laterality Date   BREAST REDUCTION SURGERY     COLONOSCOPY  2018   6 mm cecal tubular adenoma, sigmoid diverticulosis, random colon biopsies consistent with microscopic colitis   EYE SURGERY Right    cateracts   HAMMER TOE SURGERY     LEFT HEART CATH AND CORONARY ANGIOGRAPHY N/A 02/03/2018   Procedure: LEFT HEART CATH AND CORONARY ANGIOGRAPHY;  Surgeon: [name], Peter M, MD;  Location: [facility] INVASIVE CV LAB;  Service: Cardiovascular;  Laterality: N/A;   REVERSE SHOULDER ARTHROPLASTY Right 08/19/2019   Procedure: REVERSE SHOULDER ARTHROPLASTY;  Surgeon: Kay Kemps, MD;  Location: [facility] ORS;  Service: Orthopedics;  Laterality: Right;  interscalene block   SHOULDER SURGERY Right    I BROKE MY SHOUDLER   THIGH SURGERY     TO REMOVE A TUMOR    Family History Family History  Problem Relation Age of Onset   Diabetes Mother    Heart disease Mother    Alzheimer's disease Father    Heart disease Sister        CABG   Diabetes Sister    Alcohol  abuse Sister    Stroke Brother    Heart disease Brother    Mental illness Brother    Diabetes Brother    Breast cancer Neg Hx    Ovarian cancer Neg Hx    Colon cancer Neg Hx    Endometrial cancer Neg Hx     Social History Social History   Tobacco Use   Smoking status: Never   Smokeless tobacco: Never  Vaping Use   Vaping status: Never Used  Substance Use Topics   Alcohol  use: Not Currently    Alcohol /week: 0.0 standard drinks of alcohol    Drug use: No   Allergies Iodinated contrast media, Iodine, Latex, Tape, and Tizanidine   Review of Systems Review of Systems  All other systems reviewed and are negative.   Physical Exam Vital Signs  I have reviewed the triage vital signs BP 131/72 (BP Location: Right Arm)   Pulse 74   Temp 97.9 F (36.6 C) (Temporal)   Resp 18   Ht 5' 2 (1.575 m)   Wt  79.8 kg   SpO2 94%   BMI 32.19 kg/m  Physical Exam Vitals and nursing note reviewed.  Constitutional:      General: She is not in acute distress.    Appearance: She is well-developed.  HENT:     Head: Normocephalic and atraumatic.     Mouth/Throat:     Mouth: Mucous membranes are moist.  Eyes:     Pupils: Pupils are equal, round, and reactive to light.  Cardiovascular:     Rate and Rhythm: Normal rate and regular rhythm.     Heart sounds: No murmur heard. Pulmonary:     Effort: Pulmonary effort is normal. No respiratory distress.     Breath sounds: Normal breath sounds.  Abdominal:     General: Abdomen is flat.     Palpations: Abdomen is soft.     Tenderness: There is abdominal tenderness (llq).  Musculoskeletal:        General: No tenderness.     Right lower leg: No edema.     Left lower leg: No edema.  Skin:    General: Skin is warm and dry.  Neurological:     General: No focal deficit present.     Mental Status: She is alert. Mental status is at baseline.  Psychiatric:  Mood and Affect: Mood normal.        Behavior: Behavior normal.     ED Results and Treatments Labs (all labs ordered are listed, but only abnormal results are displayed) Labs Reviewed  COMPREHENSIVE METABOLIC PANEL WITH GFR - Abnormal; Notable for the following components:      Result Value   Sodium 128 (*)    Chloride 96 (*)    CO2 21 (*)    Glucose, Bld 116 (*)    Calcium  8.8 (*)    Total Protein 6.1 (*)    Albumin  3.2 (*)    All other components within normal limits  CBC WITH DIFFERENTIAL/PLATELET - Abnormal; Notable for the following components:   RBC 3.38 (*)    Hemoglobin 9.3 (*)    HCT 29.5 (*)    RDW 16.1 (*)    Lymphs Abs 0.6 (*)    All other components within normal limits  URINALYSIS, W/ REFLEX TO CULTURE (INFECTION SUSPECTED) - Abnormal; Notable for the following components:   Protein, ur 30 (*)    Leukocytes,Ua TRACE (*)    All other components within normal limits   CBG MONITORING, ED - Abnormal; Notable for the following components:   Glucose-Capillary 144 (*)    All other components within normal limits  LIPASE, BLOOD  CBG MONITORING, ED                                                                                                                          Radiology CT ABDOMEN PELVIS WO CONTRAST Result Date: 12/25/2023 CLINICAL DATA:  Abdominal pain EXAM: CT ABDOMEN AND PELVIS WITHOUT CONTRAST TECHNIQUE: Multidetector CT imaging of the abdomen and pelvis was performed following the standard protocol without IV contrast. RADIATION DOSE REDUCTION: This exam was performed according to the departmental dose-optimization program which includes automated exposure control, adjustment of the mA and/or kV according to patient size and/or use of iterative reconstruction technique. COMPARISON:  CT 12/18/2023, 10/23/2023, 10/12/2023 FINDINGS: Lower chest: Lung bases demonstrate incompletely visualized heterogeneous airspace disease in the right middle and lower lobes. Cardiomegaly with mitral annular calcification. Coronary vascular calcification. Hepatobiliary: No focal liver abnormality is seen. No gallstones, gallbladder wall thickening, or biliary dilatation. Pancreas: Unremarkable. No pancreatic ductal dilatation or surrounding inflammatory changes. Spleen: Normal in size without focal abnormality. Adrenals/Urinary Tract: Adrenal glands are normal. Kidneys show no hydronephrosis. Bladder shows possible slight thick wall and minimal perivesical stranding. Stomach/Bowel: Stomach nonenlarged. No dilated small bowel. Negative appendix. Diverticular disease of the sigmoid colon. Question very subtle fat stranding in the fat adjacent to sigmoid colon, series 5, image 49. And series 2, image 69. Questioned area of sigmoid colon narrowing is less apparent in the absence of contrast, may be visualized on series 2, image 74. Vascular/Lymphatic: Aortic atherosclerosis. No enlarged  abdominal or pelvic lymph nodes. Reproductive: Uterus and bilateral adnexa are unremarkable. Other: Negative for pelvic effusion or free air. Fat containing right groin hernia Musculoskeletal: No acute or suspicious  osseous abnormality. Mild chronic superior endplate deformities T11 and T12 as well as inferior endplate of T9. IMPRESSION: 1. Diverticular disease of the sigmoid colon. Question very subtle fat stranding in the fat adjacent to the sigmoid colon, cannot exclude very mild acute diverticulitis. Questioned area of sigmoid colon narrowing is less apparent in the absence of contrast, recommend correlation with colonoscopy if not already performed. 2. Possible slight thick wall of the bladder with minimal perivesical stranding, correlate with urinalysis to exclude cystitis. 3. Incompletely visualized heterogeneous airspace disease in the right middle and lower lobes, suspect for pneumonia, possible atypical organism. 4. Cardiomegaly. 5. Aortic atherosclerosis. Aortic Atherosclerosis (ICD10-I70.0). Electronically Signed   By: Luke Bun M.D.   On: 12/25/2023 19:36    Pertinent labs & imaging results that were available during my care of the patient were reviewed by me and considered in my medical decision making (see MDM for details).  Medications Ordered in ED Medications  ondansetron  (ZOFRAN ) injection 4 mg (4 mg Intravenous Given 12/25/23 1926)  HYDROmorphone  (DILAUDID ) injection 1 mg (1 mg Intravenous Given 12/25/23 1926)  lactated ringers  bolus 1,000 mL (1,000 mLs Intravenous Bolus 12/25/23 1929)  HYDROmorphone  (DILAUDID ) injection 1 mg (1 mg Intravenous Given 12/25/23 2032)                                                                                                                                     Procedures Procedures  (including critical care time)  Medical Decision Making / ED Course   MDM:  78 year old presenting to the emergency department with abnormal glucose, abdominal  pain.  Suspect hypoglycemic readings were due to faulty Dexcom.  While patient was in triage Dexcom here read 64 while CBG read 144 on ER glucometer.  Laboratory test support normal glucose.  Patient did report abdominal pain, did have tenderness on exam.  Obtain CT scan which does show possible diverticulitis in the sigmoid colon which seems different than recent CT scan from fall around 1 week ago.  She does report some increased pain in this area and.  Will treat with Augmentin .  Discussed radiology recommendation for colonoscopy with patient.  CT scan also with findings of possible cystitis.  Patient has been on antibiotics.  Urinalysis is bland.  She reports her UTI symptoms have been improving.  CT scan also shows possible pneumonia which was present on recent CT scan.  She denies any cough, shortness of breath, fevers or chills, seems less consistent with pneumonia, possible atelectasis.  Discussed with patient should she develop any signs of pneumonia.  Lab work also with mild hyponatremia, otherwise no AKI.  Hemoglobin is grossly stable.  Recommended close follow-up with GI and her primary physician. Will discharge patient to home. All questions answered. Patient comfortable with plan of discharge. Return precautions discussed with patient and specified on the after visit summary.       Additional history obtained: -Additional history obtained  from spouse -External records from outside source obtained and reviewed including: Chart review including previous notes, labs, imaging, consultation notes including recent ER visit    Lab Tests: -I ordered, reviewed, and interpreted labs.   The pertinent results include:   Labs Reviewed  COMPREHENSIVE METABOLIC PANEL WITH GFR - Abnormal; Notable for the following components:      Result Value   Sodium 128 (*)    Chloride 96 (*)    CO2 21 (*)    Glucose, Bld 116 (*)    Calcium  8.8 (*)    Total Protein 6.1 (*)    Albumin  3.2 (*)    All  other components within normal limits  CBC WITH DIFFERENTIAL/PLATELET - Abnormal; Notable for the following components:   RBC 3.38 (*)    Hemoglobin 9.3 (*)    HCT 29.5 (*)    RDW 16.1 (*)    Lymphs Abs 0.6 (*)    All other components within normal limits  URINALYSIS, W/ REFLEX TO CULTURE (INFECTION SUSPECTED) - Abnormal; Notable for the following components:   Protein, ur 30 (*)    Leukocytes,Ua TRACE (*)    All other components within normal limits  CBG MONITORING, ED - Abnormal; Notable for the following components:   Glucose-Capillary 144 (*)    All other components within normal limits  LIPASE, BLOOD  CBG MONITORING, ED    Notable for see MDM   Medicines ordered and prescription drug management: Meds ordered this encounter  Medications   ondansetron  (ZOFRAN ) injection 4 mg   HYDROmorphone  (DILAUDID ) injection 1 mg   lactated ringers  bolus 1,000 mL   HYDROmorphone  (DILAUDID ) injection 1 mg   amoxicillin -clavulanate (AUGMENTIN ) 875-125 MG tablet    Sig: Take 1 tablet by mouth every 12 (twelve) hours.    Dispense:  14 tablet    Refill:  0    -I have reviewed the patients home medicines and have made adjustments as needed  Social Determinants of Health:  Diagnosis or treatment significantly limited by social determinants of health: obesity   Reevaluation: After the interventions noted above, I reevaluated the patient and found that their symptoms have improved  Co morbidities that complicate the patient evaluation  Past Medical History:  Diagnosis Date   Allergic rhinitis 02/24/2019   Ambulatory dysfunction 04/23/2020   Atrial fibrillation with RVR (HCC) 05/19/2017   Back pain/sacroiliitis--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   Benign paroxysmal positional vertigo due to bilateral vestibular disorder 05/20/2019   Bipolar 1 disorder (HCC) 01/23/2015   with GAD, benzo  dependence   CAD (coronary artery disease)    Nonobstructive; Managed by Dr. Charls   Cardiomegaly 01/12/2018   CHF (congestive heart failure) 06/08/2022   Chronic back pain 01/04/2015   Chronic constipation 04/25/2020   Chronic diastolic heart failure (HCC) 05/30/2015   Chronic pain syndrome 08/22/2019   back pain, sacroiliitis   Chronic post-traumatic stress disorder (PTSD) 12/06/2020   Diabetic neuropathy (HCC) 02/06/2016   Dyslipidemia 09/24/2020   Essential hypertension    Functional diarrhea 10/26/2020   Herpes genitalis in women 07/16/2015   History of adenomatous polyp of colon 05/21/2019   Overview:   03/31/17: Colonoscopy: nonadvanced adenoma, microscopic colitis, f/u 5 yrs, [facility]/GAP   History of radiation therapy    Endometrial- 02/18/23-03/31/23-Dr. Lynwood Kinard   Hypotension 09/01/2022   Insomnia 01/23/2015   Metabolic acidosis 01/12/2023   Migraine headache with  aura 02/12/2016   Myofascial pain dysfunction syndrome 08/22/2019   Non-alcoholic fatty liver disease 01/12/2018   OSA (obstructive sleep apnea) 02/24/2019   10/09/2018 - HST  - AHI 40.6    Osteopenia 12/06/2020   Rx alendronate  35 mg.   Pinguecula 12/06/2020   Presbyopia 12/06/2020   Pulmonary hypertension    RLS (restless legs syndrome) 04/27/2015   RSV (respiratory syncytial virus infection) 06/10/2023   Tremor, essential 12/11/2021   Type II diabetes mellitus (HCC) 09/24/2020      Dispostion: Disposition decision including need for hospitalization was considered, and patient discharged from emergency department.    Final Clinical Impression(s) / ED Diagnoses Final diagnoses:  Diverticulitis  Person with feared complaint, no diagnosis made     This chart was dictated using voice recognition software.  Despite best efforts to proofread,  errors can occur which can change the documentation meaning.    Francesca Elsie CROME, MD 12/25/23 2256

## 2023-12-25 NOTE — Progress Notes (Signed)
 BP 133/85   Pulse 79   Ht 5' 2 (1.575 m)   Wt 176 lb (79.8 kg)   SpO2 96%   BMI 32.19 kg/m    Subjective:   Patient ID: Amber Stephenson, female    DOB: 1945/11/18, 78 y.o.   MRN: [id number]  HPI: Amber Stephenson is a 78 y.o. female presenting on 12/25/2023 for Fall, sore on left buttocks, and Leg Pain (RLE after fall)   HPI Recurrent falls Patient had another fall where she says she stumbled and fell, this time it was a week ago and she was seen in the emergency department and had x-rays manage nothing was fractured but she has had these recurrently where she falls either because she feels a little lightheaded where she just feels weak and off balance.  She does have some lower extremity weakness and deconditioning because she is sitting most of the time especially with her health issues.  She is also coming in today because she has a couple of sores on her left bottom that she wants us  to look at she does not know if they were there from the fall.  She says they are very tender.  She has some bruising on her anterior right lower leg.  Relevant past medical, surgical, family and social history reviewed and updated as indicated. Interim medical history since our last visit reviewed. Allergies and medications reviewed and updated.  Review of Systems  Constitutional:  Negative for chills and fever.  HENT:  Negative for congestion, ear discharge and ear pain.   Eyes:  Negative for redness and visual disturbance.  Respiratory:  Negative for chest tightness and shortness of breath.   Cardiovascular:  Negative for chest pain and leg swelling.  Genitourinary:  Negative for difficulty urinating and dysuria.  Musculoskeletal:  Positive for arthralgias, back pain, gait problem and myalgias.  Skin:  Positive for rash.  Neurological:  Positive for weakness. Negative for dizziness, light-headedness and headaches.  Psychiatric/Behavioral:  Negative for agitation and behavioral problems.   All  other systems reviewed and are negative.   Per HPI unless specifically indicated above   Allergies as of 12/25/2023       Reactions   Iodinated Contrast Media Anaphylaxis, Swelling, Other (See Comments)   Throat closes, swelling of throat and tongue   Iodine Anaphylaxis   Latex Other (See Comments)   Latex tape pulls skin with it   Tape Other (See Comments)   Pulls off the skin, if latex   Tizanidine  Other (See Comments)   Weakness, goofy, bad dreams        Medication List        Accurate as of December 25, 2023 10:26 AM. If you have any questions, ask your nurse or doctor.          STOP taking these medications    Belsomra  20 MG Tabs Generic drug: Suvorexant  Stopped by: Fonda LABOR Jaye Saal   colchicine  0.6 MG tablet Stopped by: Fonda LABOR Jafar Poffenberger       TAKE these medications    ALPRAZolam  1 MG tablet Commonly known as: XANAX  Take 1 mg by mouth.   amoxicillin  500 MG tablet Commonly known as: AMOXIL  Take 500 mg by mouth 3 (three) times daily.   Anucort-HC  25 MG suppository Generic drug: hydrocortisone  Place 25 mg rectally daily.   ARIPiprazole  2 MG tablet Commonly known as: ABILIFY  Take 2 mg by mouth at bedtime.   atorvastatin  80 MG tablet Commonly known as: LIPITOR  Take 1 tablet (80 mg total) by mouth daily.   carbidopa -levodopa  10-100 MG tablet Commonly known as: Sinemet  Take 1 tablet by mouth 4 (four) times daily. For restless leg syndrome   chlorhexidine  0.12 % solution Commonly known as: PERIDEX  Use as directed 5 mLs in the mouth or throat 2 (two) times daily.   cyanocobalamin  1000 MCG tablet Commonly known as: VITAMIN B12 Take 1 tablet (1,000 mcg total) by mouth daily.   Dexcom G7 Sensor Misc CHANGE SENSOR EVERY 10 DAYS   diphenoxylate -atropine  2.5-0.025 MG tablet Commonly known as: LOMOTIL  TAKE 2 TABLETS BY MOUTH FOUR TIMES DAILY AS NEEDED FOR DIARRHEA OR LOOSE STOOLS   Eliquis  5 MG Tabs tablet Generic drug: apixaban  TAKE 1 TABLET  TWICE A DAY   escitalopram  10 MG tablet Commonly known as: LEXAPRO  Take 10 mg by mouth daily.   fluticasone -salmeterol 500-50 MCG/ACT Aepb Commonly known as: Advair Diskus Inhale 1 puff into the lungs in the morning and at bedtime.   furosemide  40 MG tablet Commonly known as: LASIX  Take 40 mg by mouth daily.   hydrochlorothiazide  12.5 MG capsule Commonly known as: MICROZIDE  Take 12.5 mg by mouth daily.   hydrOXYzine  25 MG capsule Commonly known as: VISTARIL  Take 1 capsule (25 mg total) by mouth 3 (three) times daily.   insulin  lispro 100 UNIT/ML KwikPen Commonly known as: HumaLOG  KwikPen Max daily 45 units   Insulin  Pen Needle 29G X Misc 1 Device by Does not apply route daily in the afternoon.   Lantus  SoloStar 100 UNIT/ML Solostar Pen Generic drug: insulin  glargine Inject 30 Units into the skin 2 (two) times daily.   LORazepam  0.5 MG tablet Commonly known as: ATIVAN  Take 0.5-1 mg by mouth in the morning, at noon, and at bedtime. Take 0.5 mg by mouth in the morning, 0.5 mg at lunch, and 0.5 mg at bedtime   losartan  25 MG tablet Commonly known as: COZAAR  Take 25 mg by mouth daily.   MAGNESIUM  PO Take 1 tablet by mouth daily.   meclizine  25 MG tablet Commonly known as: ANTIVERT  Take 1 tablet (25 mg total) by mouth 3 (three) times daily as needed for dizziness.   metFORMIN  500 MG 24 hr tablet Commonly known as: GLUCOPHAGE -XR Take 2 tablets (1,000 mg total) by mouth daily with breakfast.   Methocarbamol  1000 MG Tabs Take 1,000 mg by mouth every 6 (six) hours as needed.   metoprolol  tartrate 50 MG tablet Commonly known as: LOPRESSOR  TAKE 1 TABLET TWICE A DAY   omeprazole  40 MG capsule Commonly known as: PRILOSEC Take 1 capsule (40 mg total) by mouth 2 (two) times daily before a meal.   ondansetron  4 MG tablet Commonly known as: Zofran  Take 1 tablet (4 mg total) by mouth every 8 (eight) hours as needed for nausea or vomiting. For cancer related nausea    OXcarbazepine  150 MG tablet Commonly known as: TRILEPTAL  Take 1 tablet (150 mg total) by mouth 2 (two) times daily.   oxyCODONE  20 mg 12 hr tablet Commonly known as: OXYCONTIN  Take 1 tablet (20 mg total) by mouth every 12 (twelve) hours.   potassium chloride  10 MEQ tablet Commonly known as: KLOR-CON  Take 1 tablet (10 mEq total) by mouth daily.   Prolia  60 MG/ML Sosy injection Generic drug: denosumab  Inject 60 mg into the skin every 6 (six) months.   rOPINIRole  1 MG tablet Commonly known as: REQUIP  Take 1 mg by mouth at bedtime.   tirzepatide  12.5 MG/0.5ML Pen Commonly known as: MOUNJARO   Inject 12.5 mg into the skin once a week.   Vitamin D  (Cholecalciferol ) 25 MCG (1000 UT) Tabs Take 1,000 Units by mouth daily in the afternoon.         Objective:   BP 133/85   Pulse 79   Ht 5' 2 (1.575 m)   Wt 176 lb (79.8 kg)   SpO2 96%   BMI 32.19 kg/m   Wt Readings from Last 3 Encounters:  12/25/23 176 lb (79.8 kg)  12/18/23 170 lb (77.1 kg)  12/16/23 170 lb (77.1 kg)    Physical Exam Vitals and nursing note reviewed.  Constitutional:      Appearance: Normal appearance.  Skin:    Findings: Bruising (Bruising on right anterior lower leg) and rash (Stage I pressure sore on left buttock, mid buttock and 1 lower to the bottom of her buttocks.  No sign of erythema or major infection.) present. No erythema.  Neurological:     Mental Status: She is alert.       Assessment & Plan:   Problem List Items Addressed This Visit   None Visit Diagnoses       Pressure injury of left buttock, stage 1    -  Primary   Relevant Orders   Ambulatory referral to Home Health     Recurrent falls       Relevant Orders   Ambulatory referral to Home Health     Muscular deconditioning       Relevant Orders   Ambulatory referral to Home Health       For pressure sore recommended that they use topical triple antibiotic ointment and then Vaseline or Desitin on it.  Recommended that  she needs to talk with her PCP and her psychologist and reduce her medications that can cause weakness and falls in the elderly.  For recurrent falls placed referral for home health physical therapy to get her up and moving.  This will also help with the pressure ulcer. Follow up plan: Return if symptoms worsen or fail to improve.  Counseling provided for all of the vaccine components Orders Placed This Encounter  Procedures   Ambulatory referral to Home Health    Fonda Levins, MD [facility] 12/25/2023, 10:26 AM

## 2023-12-25 NOTE — ED Notes (Signed)
 Pt states having posterior head pain since fall last Friday. Pt also states abdominal pain, left buttock pain. Provider at bedside.

## 2023-12-25 NOTE — ED Triage Notes (Signed)
 Pt reports low CBGs (54) at home, reports she took 32 units insulin  at home this am (as ordered), Dexcom reading 64 in triage, CBG completed in triage with 144 reading, pt states she just changed out her Dexcom

## 2023-12-25 NOTE — Discharge Instructions (Addendum)
 We evaluated you for your abdominal pain and for your low blood sugar.  We think your low blood sugar was due to a faulty Dexcom device.  Your blood sugar in the emergency department was normal when we checked it on our glucometer and it was normal on your blood test.  For now until you get additional Dexcom units, please use a regular glucometer to check your blood sugar.  We also evaluated you for your abdominal pain.  Your CT scan did show an area of possible inflammation in your abdomen called diverticulitis.  We have prescribed you an antibiotic for this.  This antibiotic is stronger than the antibiotic you are currently taking for your UTI, so you do not need to finish that antibiotic.  Please follow-up with Dr. Shaaron.  You have any new or worsening symptoms such as low blood sugar, increasing weakness, fevers or chills, vomiting, lightheadedness or dizziness, or any other symptoms, please return to the emergency department

## 2023-12-25 NOTE — ED Notes (Signed)
 Pt back from CT

## 2023-12-25 NOTE — Telephone Encounter (Signed)
 FYI Only or Action Required?: FYI only for provider.  Patient was last seen in primary care on 12/25/2023 by Dettinger, Fonda LABOR, MD.  Called Nurse Triage reporting Hypoglycemia.  Symptoms began today.  Interventions attempted: Other: glucose tabs, oj.  Symptoms are: gradually worsening.  Triage Disposition: Go to ED Now (Notify PCP)  Patient/caregiver understands and will follow disposition?: Yes   Copied from CRM [id number]. Topic: Clinical - Red Word Triage >> Dec 25, 2023  4:52 PM Zebedee SAUNDERS wrote: Red Word that prompted transfer to Nurse Triage: Pt blood sugar is dropping 65, 64. She is not feeling well. Reason for Disposition  [1] Low blood sugar symptoms persist > 30 minutes AND [2] using low blood sugar Care Advice  Answer Assessment - Initial Assessment Questions 1. SYMPTOMS: What symptoms are you concerned about?     Hypoglycemia  2. ONSET:  When did the symptoms start?     Today 3. BLOOD GLUCOSE: What is your blood glucose level?      64 4. USUAL RANGE: What is your blood glucose level usually? (e.g., usual fasting morning value, usual evening value)     Not asked emergent symptoms 5. TYPE 1 or 2:  Do you know what type of diabetes you have?  (e.g., Type 1, Type 2, Gestational; doesn't know)      Not asked emergent symptoms 6. INSULIN : Do you take insulin ? What type of insulin (s) do you use? What is the mode of delivery? (syringe, pen; injection or pump) When did you last give yourself an insulin  dose? (i.e., time or hours/minutes ago) How much did you give? (i.e., how many units)     Not asked emergent symptoms 7. DIABETES PILLS: Do you take any pills for your diabetes? If Yes, ask: What is the name of the medicine(s) that you take for high blood sugar?     Not asked emergent symptoms 8. OTHER SYMPTOMS: Do you have any symptoms? (e.g., fever, frequent urination, difficulty breathing, vomiting)     Doesn't feel well 9. LOW BLOOD GLUCOSE TREATMENT:  What have you done so far to treat the low blood glucose level?     Orange juice -no improvement, blood sugar 65 down to 64 after oj 10. FOOD: When did you last eat or drink?       Eat glucose tab now. Had oj.  11. ALONE: Are you alone right now or is someone with you?        Family present and will transport to hospital. Arlean Salmon.  Protocols used: Diabetes - Low Blood Sugar-A-AH

## 2023-12-25 NOTE — ED Notes (Signed)
 Patient transported to CT

## 2023-12-27 ENCOUNTER — Emergency Department (HOSPITAL_COMMUNITY)

## 2023-12-27 ENCOUNTER — Encounter (HOSPITAL_COMMUNITY): Payer: Self-pay

## 2023-12-27 ENCOUNTER — Other Ambulatory Visit: Payer: Self-pay

## 2023-12-27 ENCOUNTER — Emergency Department (HOSPITAL_COMMUNITY)
Admission: EM | Admit: 2023-12-27 | Discharge: 2023-12-27 | Disposition: A | Discharge status: Home / Self Care | Attending: Emergency Medicine | Admitting: Emergency Medicine

## 2023-12-27 DIAGNOSIS — Z91040 Latex allergy status: Secondary | ICD-10-CM | POA: Diagnosis not present

## 2023-12-27 DIAGNOSIS — M47812 Spondylosis without myelopathy or radiculopathy, cervical region: Secondary | ICD-10-CM | POA: Diagnosis not present

## 2023-12-27 DIAGNOSIS — I700 Atherosclerosis of aorta: Secondary | ICD-10-CM | POA: Diagnosis not present

## 2023-12-27 DIAGNOSIS — W1830XA Fall on same level, unspecified, initial encounter: Secondary | ICD-10-CM | POA: Diagnosis not present

## 2023-12-27 DIAGNOSIS — S3992XA Unspecified injury of lower back, initial encounter: Secondary | ICD-10-CM | POA: Diagnosis not present

## 2023-12-27 DIAGNOSIS — I509 Heart failure, unspecified: Secondary | ICD-10-CM | POA: Diagnosis not present

## 2023-12-27 DIAGNOSIS — R0789 Other chest pain: Secondary | ICD-10-CM | POA: Insufficient documentation

## 2023-12-27 DIAGNOSIS — Z794 Long term (current) use of insulin: Secondary | ICD-10-CM | POA: Diagnosis not present

## 2023-12-27 DIAGNOSIS — M545 Low back pain, unspecified: Secondary | ICD-10-CM | POA: Diagnosis not present

## 2023-12-27 DIAGNOSIS — Z79899 Other long term (current) drug therapy: Secondary | ICD-10-CM | POA: Insufficient documentation

## 2023-12-27 DIAGNOSIS — I110 Hypertensive heart disease with heart failure: Secondary | ICD-10-CM | POA: Insufficient documentation

## 2023-12-27 DIAGNOSIS — M546 Pain in thoracic spine: Secondary | ICD-10-CM | POA: Insufficient documentation

## 2023-12-27 DIAGNOSIS — I2510 Atherosclerotic heart disease of native coronary artery without angina pectoris: Secondary | ICD-10-CM | POA: Diagnosis not present

## 2023-12-27 DIAGNOSIS — W19XXXA Unspecified fall, initial encounter: Secondary | ICD-10-CM

## 2023-12-27 DIAGNOSIS — Z7901 Long term (current) use of anticoagulants: Secondary | ICD-10-CM | POA: Diagnosis not present

## 2023-12-27 DIAGNOSIS — S299XXA Unspecified injury of thorax, initial encounter: Secondary | ICD-10-CM | POA: Diagnosis not present

## 2023-12-27 DIAGNOSIS — Z7984 Long term (current) use of oral hypoglycemic drugs: Secondary | ICD-10-CM | POA: Insufficient documentation

## 2023-12-27 DIAGNOSIS — M542 Cervicalgia: Secondary | ICD-10-CM | POA: Diagnosis not present

## 2023-12-27 DIAGNOSIS — M5136 Other intervertebral disc degeneration, lumbar region with discogenic back pain only: Secondary | ICD-10-CM | POA: Diagnosis not present

## 2023-12-27 DIAGNOSIS — S0990XA Unspecified injury of head, initial encounter: Secondary | ICD-10-CM | POA: Diagnosis not present

## 2023-12-27 DIAGNOSIS — R519 Headache, unspecified: Secondary | ICD-10-CM | POA: Diagnosis present

## 2023-12-27 DIAGNOSIS — M4856XA Collapsed vertebra, not elsewhere classified, lumbar region, initial encounter for fracture: Secondary | ICD-10-CM | POA: Diagnosis not present

## 2023-12-27 DIAGNOSIS — E119 Type 2 diabetes mellitus without complications: Secondary | ICD-10-CM | POA: Insufficient documentation

## 2023-12-27 LAB — CBC WITH DIFFERENTIAL/PLATELET
Abs Immature Granulocytes: 0.03 K/uL (ref 0.00–0.07)
Basophils Absolute: 0 K/uL (ref 0.0–0.1)
Basophils Relative: 0 %
Eosinophils Absolute: 0.1 K/uL (ref 0.0–0.5)
Eosinophils Relative: 3 %
HCT: 31.7 % — ABNORMAL LOW (ref 36.0–46.0)
Hemoglobin: 10 g/dL — ABNORMAL LOW (ref 12.0–15.0)
Immature Granulocytes: 1 %
Lymphocytes Relative: 11 %
Lymphs Abs: 0.6 K/uL — ABNORMAL LOW (ref 0.7–4.0)
MCH: 27.5 pg (ref 26.0–34.0)
MCHC: 31.5 g/dL (ref 30.0–36.0)
MCV: 87.3 fL (ref 80.0–100.0)
Monocytes Absolute: 0.5 K/uL (ref 0.1–1.0)
Monocytes Relative: 11 %
Neutro Abs: 3.6 K/uL (ref 1.7–7.7)
Neutrophils Relative %: 74 %
Platelets: 214 K/uL (ref 150–400)
RBC: 3.63 MIL/uL — ABNORMAL LOW (ref 3.87–5.11)
RDW: 16.1 % — ABNORMAL HIGH (ref 11.5–15.5)
WBC: 4.9 K/uL (ref 4.0–10.5)
nRBC: 0 % (ref 0.0–0.2)

## 2023-12-27 LAB — BASIC METABOLIC PANEL WITH GFR
Anion gap: 11 (ref 5–15)
BUN: 12 mg/dL (ref 8–23)
CO2: 23 mmol/L (ref 22–32)
Calcium: 8.9 mg/dL (ref 8.9–10.3)
Chloride: 95 mmol/L — ABNORMAL LOW (ref 98–111)
Creatinine, Ser: 0.64 mg/dL (ref 0.44–1.00)
GFR, Estimated: >60 mL/min (ref >60)
Glucose, Bld: 86 mg/dL (ref 70–99)
Potassium: 4.1 mmol/L (ref 3.5–5.1)
Sodium: 129 mmol/L — ABNORMAL LOW (ref 135–145)

## 2023-12-27 MED ORDER — HYDROMORPHONE HCL 1 MG/ML IJ SOLN
1.0000 mg | Freq: Once | INTRAMUSCULAR | Status: AC
  Administered 2023-12-27: 1 mg via INTRAVENOUS
  Filled 2023-12-27: qty 1

## 2023-12-27 MED ORDER — ONDANSETRON HCL 4 MG/2ML IJ SOLN
4.0000 mg | Freq: Once | INTRAMUSCULAR | Status: AC
  Administered 2023-12-27: 4 mg via INTRAVENOUS
  Filled 2023-12-27: qty 2

## 2023-12-27 MED ORDER — FENTANYL CITRATE (PF) 100 MCG/2ML IJ SOLN
50.0000 ug | Freq: Once | INTRAMUSCULAR | Status: AC
  Administered 2023-12-27: 50 ug via INTRAVENOUS
  Filled 2023-12-27: qty 2

## 2023-12-27 MED ORDER — ACETAMINOPHEN 500 MG PO TABS
1000.0000 mg | ORAL_TABLET | Freq: Once | ORAL | Status: DC
Start: 2023-12-27 — End: 2023-12-27
  Filled 2023-12-27: qty 2

## 2023-12-27 MED ORDER — KETOROLAC TROMETHAMINE 15 MG/ML IJ SOLN
15.0000 mg | Freq: Once | INTRAMUSCULAR | Status: AC
  Administered 2023-12-27: 15 mg via INTRAVENOUS
  Filled 2023-12-27: qty 1

## 2023-12-27 NOTE — ED Provider Notes (Signed)
 [place] [facility]MENT AT [facility] Provider Note   CSN: [id number] Arrival date & time: 12/27/23  9078     Patient presents with: Fall and Rib Injury   Amber Stephenson is a 78 y.o. female.   78 y.o. female history of atrial fibrillation, bipolar disorder, coronary artery disease, CHF, diabetes, hypertension, hyperlipidemia presenting for a fall that occurred last night.  She got up around 12 AM to go to the bathroom and had a mechanical fall falling backwards.  She did strike her head.  Has a soft cervical collar on that she put on after the fall because of the neck pain.  Endorses left-sided chest wall pain, and back pain.  Denies other injuries.  She is on Eliquis  for A-fib.  She is sure that this was a mechanical fall.  Chest pain started after the fall.  No other concerns.  The history is provided by the patient. No language interpreter was used.       Prior to Admission medications   Medication Sig Start Date End Date Taking? Authorizing Provider  ALPRAZolam  (XANAX ) 1 MG tablet Take 1 mg by mouth. 12/21/23   [provider]  amoxicillin -clavulanate (AUGMENTIN ) 875-125 MG tablet Take 1 tablet by mouth every 12 (twelve) hours. 12/25/23   Francesca Elsie CROME, MD  ANUCORT-HC  25 MG suppository Place 25 mg rectally daily. 09/28/23   [provider]  ARIPiprazole  (ABILIFY ) 2 MG tablet Take 2 mg by mouth at bedtime. 10/07/21   [provider]  atorvastatin  (LIPITOR) 80 MG tablet Take 1 tablet (80 mg total) by mouth daily. 02/05/23 12/25/23  Lucien Orren SAILOR, PA-C  carbidopa -levodopa  (SINEMET ) 10-100 MG tablet Take 1 tablet by mouth 4 (four) times daily. For restless leg syndrome 12/01/23   [name], MD  chlorhexidine  (PERIDEX ) 0.12 % solution Use as directed 5 mLs in the mouth or throat 2 (two) times daily. 12/09/23   [provider]  Continuous Glucose Sensor (DEXCOM G7 SENSOR) MISC CHANGE SENSOR EVERY 10 DAYS 09/28/23   Shamleffer,  Donell Cardinal, MD  cyanocobalamin  (VITAMIN B12) 1000 MCG tablet Take 1 tablet (1,000 mcg total) by mouth daily. 09/04/22   Ghimire, Donalda HERO, MD  denosumab  (PROLIA ) 60 MG/ML SOSY injection Inject 60 mg into the skin every 6 (six) months. 08/14/20   [name], MD  diphenoxylate -atropine  (LOMOTIL ) 2.5-0.025 MG tablet TAKE 2 TABLETS BY MOUTH FOUR TIMES DAILY AS NEEDED FOR DIARRHEA OR LOOSE STOOLS 11/24/23   [name], MD  ELIQUIS  5 MG TABS tablet TAKE 1 TABLET TWICE A DAY 11/02/23   Shlomo Wilbert SAUNDERS, MD  escitalopram  (LEXAPRO ) 10 MG tablet Take 10 mg by mouth daily.    [provider]  fluticasone -salmeterol (ADVAIR DISKUS) 500-50 MCG/ACT AEPB Inhale 1 puff into the lungs in the morning and at bedtime. 10/06/23   [name], MD  furosemide  (LASIX ) 40 MG tablet Take 40 mg by mouth daily. 09/18/23   [provider]  hydrochlorothiazide  (MICROZIDE ) 12.5 MG capsule Take 12.5 mg by mouth daily. 11/19/23   [provider]  hydrOXYzine  (VISTARIL ) 25 MG capsule Take 1 capsule (25 mg total) by mouth 3 (three) times daily. 12/01/23   [name], MD  insulin  glargine (LANTUS  SOLOSTAR) 100 UNIT/ML Solostar Pen Inject 30 Units into the skin 2 (two) times daily. 09/21/23   Shamleffer, Ibtehal Jaralla, MD  insulin  lispro (HUMALOG  KWIKPEN) 100 UNIT/ML KwikPen Max daily 45 units 05/22/23   Shamleffer, Ibtehal Jaralla, MD  Insulin  Pen Needle 29G  X MISC 1 Device by Does not apply route daily in the afternoon. 05/22/23   Shamleffer, Ibtehal Jaralla, MD  LORazepam  (ATIVAN ) 0.5 MG tablet Take 0.5-1 mg by mouth in the morning, at noon, and at bedtime. Take 0.5 mg by mouth in the morning, 0.5 mg at lunch, and 0.5 mg at bedtime    [provider]  losartan  (COZAAR ) 25 MG tablet Take 25 mg by mouth daily. 02/14/23   [provider]  MAGNESIUM  PO Take 1 tablet by mouth daily.    [provider]  meclizine  (ANTIVERT ) 25 MG tablet Take 1 tablet (25 mg total) by mouth  3 (three) times daily as needed for dizziness. 06/05/23   Dettinger, Fonda LABOR, MD  metFORMIN  (GLUCOPHAGE -XR) 500 MG 24 hr tablet Take 2 tablets (1,000 mg total) by mouth daily with breakfast. 05/22/23   Shamleffer, Donell Cardinal, MD  Methocarbamol  1000 MG TABS Take 1,000 mg by mouth every 6 (six) hours as needed. 08/31/23   Lovorn, Megan, MD  metoprolol  tartrate (LOPRESSOR ) 50 MG tablet TAKE 1 TABLET TWICE A DAY 12/09/23   Shlomo Wilbert SAUNDERS, MD  omeprazole  (PRILOSEC) 40 MG capsule Take 1 capsule (40 mg total) by mouth 2 (two) times daily before a meal. 10/23/23   Rudy Josette RAMAN, PA-C  ondansetron  (ZOFRAN ) 4 MG tablet Take 1 tablet (4 mg total) by mouth every 8 (eight) hours as needed for nausea or vomiting. For cancer related nausea 10/23/23   Rudy Josette RAMAN, PA-C  OXcarbazepine  (TRILEPTAL ) 150 MG tablet Take 1 tablet (150 mg total) by mouth 2 (two) times daily. 06/13/22   Sherrill Cable Latif, DO  oxyCODONE  (OXYCONTIN ) 20 mg 12 hr tablet Take 1 tablet (20 mg total) by mouth every 12 (twelve) hours. 10/29/23   Debby Fidela CROME, NP  potassium chloride  (KLOR-CON ) 10 MEQ tablet Take 1 tablet (10 mEq total) by mouth daily. 02/05/23 12/25/23  Lucien Orren SAILOR, PA-C  rOPINIRole  (REQUIP ) 1 MG tablet Take 1 mg by mouth at bedtime.    [provider]  tirzepatide  (MOUNJARO ) 12.5 MG/0.5ML Pen Inject 12.5 mg into the skin once a week. 09/21/23   Shamleffer, Donell Cardinal, MD  Vitamin D , Cholecalciferol , 25 MCG (1000 UT) TABS Take 1,000 Units by mouth daily in the afternoon. 07/26/20   [provider]    Allergies: Iodinated contrast media, Iodine, Latex, Tape, and Tizanidine     Review of Systems  Constitutional:  Negative for chills and fever.  Cardiovascular:  Positive for chest pain (chest wall pain).  Neurological:  Positive for headaches. Negative for syncope and light-headedness.  All other systems reviewed and are negative.   Updated Vital Signs BP (!) 152/87   Pulse 89   Temp 98.2 F  (36.8 C) (Oral)   Resp (!) 22   Ht 5' 2 (1.575 m)   Wt 79.8 kg   SpO2 91%   BMI 32.19 kg/m   Physical Exam Vitals and nursing note reviewed.  Constitutional:      General: She is not in acute distress.    Appearance: Normal appearance. She is not ill-appearing.  HENT:     Head: Normocephalic and atraumatic.     Nose: Nose normal.  Eyes:     Conjunctiva/sclera: Conjunctivae normal.  Cardiovascular:     Rate and Rhythm: Normal rate and regular rhythm.  Pulmonary:     Effort: Pulmonary effort is normal. No respiratory distress.  Musculoskeletal:        General: No deformity. Normal range  of motion.     Cervical back: Normal range of motion.     Comments: C-collar in place.  Thoracic and lumbar spine with some tenderness to palpation around the mid back.  Good range of motion in bilateral upper and lower extremities with 5/5 strength and without any tenderness over any major joints.  Left-sided chest wall tenderness to palpation present.  Skin:    Findings: No rash.  Neurological:     Mental Status: She is alert.     (all labs ordered are listed, but only abnormal results are displayed) Labs Reviewed  CBC WITH DIFFERENTIAL/PLATELET  BASIC METABOLIC PANEL WITH GFR    EKG: None  Radiology: CT ABDOMEN PELVIS WO CONTRAST Result Date: 12/25/2023 CLINICAL DATA:  Abdominal pain EXAM: CT ABDOMEN AND PELVIS WITHOUT CONTRAST TECHNIQUE: Multidetector CT imaging of the abdomen and pelvis was performed following the standard protocol without IV contrast. RADIATION DOSE REDUCTION: This exam was performed according to the departmental dose-optimization program which includes automated exposure control, adjustment of the mA and/or kV according to patient size and/or use of iterative reconstruction technique. COMPARISON:  CT 12/18/2023, 10/23/2023, 10/12/2023 FINDINGS: Lower chest: Lung bases demonstrate incompletely visualized heterogeneous airspace disease in the right middle and lower  lobes. Cardiomegaly with mitral annular calcification. Coronary vascular calcification. Hepatobiliary: No focal liver abnormality is seen. No gallstones, gallbladder wall thickening, or biliary dilatation. Pancreas: Unremarkable. No pancreatic ductal dilatation or surrounding inflammatory changes. Spleen: Normal in size without focal abnormality. Adrenals/Urinary Tract: Adrenal glands are normal. Kidneys show no hydronephrosis. Bladder shows possible slight thick wall and minimal perivesical stranding. Stomach/Bowel: Stomach nonenlarged. No dilated small bowel. Negative appendix. Diverticular disease of the sigmoid colon. Question very subtle fat stranding in the fat adjacent to sigmoid colon, series 5, image 49. And series 2, image 69. Questioned area of sigmoid colon narrowing is less apparent in the absence of contrast, may be visualized on series 2, image 74. Vascular/Lymphatic: Aortic atherosclerosis. No enlarged abdominal or pelvic lymph nodes. Reproductive: Uterus and bilateral adnexa are unremarkable. Other: Negative for pelvic effusion or free air. Fat containing right groin hernia Musculoskeletal: No acute or suspicious osseous abnormality. Mild chronic superior endplate deformities T11 and T12 as well as inferior endplate of T9. IMPRESSION: 1. Diverticular disease of the sigmoid colon. Question very subtle fat stranding in the fat adjacent to the sigmoid colon, cannot exclude very mild acute diverticulitis. Questioned area of sigmoid colon narrowing is less apparent in the absence of contrast, recommend correlation with colonoscopy if not already performed. 2. Possible slight thick wall of the bladder with minimal perivesical stranding, correlate with urinalysis to exclude cystitis. 3. Incompletely visualized heterogeneous airspace disease in the right middle and lower lobes, suspect for pneumonia, possible atypical organism. 4. Cardiomegaly. 5. Aortic atherosclerosis. Aortic Atherosclerosis  (ICD10-I70.0). Electronically Signed   By: Luke Bun M.D.   On: 12/25/2023 19:36     Procedures   Medications Ordered in the ED  ondansetron  (ZOFRAN ) injection 4 mg (has no administration in time range)  fentaNYL  (SUBLIMAZE ) injection 50 mcg (has no administration in time range)                                    Medical Decision Making Amount and/or Complexity of Data Reviewed Labs: ordered. Radiology: ordered.  Risk OTC drugs. Prescription drug management.   Medical Decision Making / ED Course   This patient presents to the ED  for concern of fall, this involves an extensive number of treatment options, and is a complaint that carries with it a high risk of complications and morbidity.  The differential diagnosis includes intracranial injury, cervical spine fracture, spinal fracture, rib fracture, contusion  MDM: 78 year old female presents with above-mentioned complaint.  She is on Eliquis  for A-fib.  She fell around 12 AM. Complains of chest wall pain, and neck pain.  Has some tenderness over her mid spine on exam.  All other joints without tenderness to palpation and good range of motion.  CBC without oxytocics.  No anemia around baseline.  BMP with sodium of 129 which is around her recent baseline.  No significant drop.  Kidney function normal.  CT head and C-spine without acute findings.  CT T-spine and L-spine without acute fracture.  There are some chronic fractures.  CT chest shows potential pneumonia but otherwise no traumatic injury.  She was prescribed Augmentin  2 days ago which she can continue for this potential pneumonia.  Discussed close follow-up with PCP.  Pain improved.  She does have chronic pain medications and she is prescribed that she can continue taking.  Incentive spirometer given.  Patient discharged in stable condition.  Patient voices understanding and is in agreement with plan.   Additional history obtained: -Additional history obtained  from husband who is at bedside -External records from outside source obtained and reviewed including: Chart review including previous notes, labs, imaging, consultation notes   Lab Tests: -I ordered, reviewed, and interpreted labs.   The pertinent results include:   Labs Reviewed  CBC WITH DIFFERENTIAL/PLATELET - Abnormal; Notable for the following components:      Result Value   RBC 3.63 (*)    Hemoglobin 10.0 (*)    HCT 31.7 (*)    RDW 16.1 (*)    Lymphs Abs 0.6 (*)    All other components within normal limits  BASIC METABOLIC PANEL WITH GFR - Abnormal; Notable for the following components:   Sodium 129 (*)    Chloride 95 (*)    All other components within normal limits      EKG  EKG Interpretation Date/Time:    Ventricular Rate:    PR Interval:    QRS Duration:    QT Interval:    QTC Calculation:   R Axis:      Text Interpretation:           Imaging Studies ordered: I ordered imaging studies including chest x-ray, CT head, CT C-spine, CT T-spine, CT L-spine, CT chest without contrast I independently visualized and interpreted imaging. I agree with the radiologist interpretation   Medicines ordered and prescription drug management: Meds ordered this encounter  Medications   ondansetron  (ZOFRAN ) injection 4 mg   fentaNYL  (SUBLIMAZE ) injection 50 mcg   acetaminophen  (TYLENOL ) tablet 1,000 mg   ketorolac  (TORADOL ) 15 MG/ML injection 15 mg   HYDROmorphone  (DILAUDID ) injection 1 mg    -I have reviewed the patients home medicines and have made adjustments as needed   Reevaluation: After the interventions noted above, I reevaluated the patient and found that they have :improved  Co morbidities that complicate the patient evaluation  Past Medical History:  Diagnosis Date   Allergic rhinitis 02/24/2019   Ambulatory dysfunction 04/23/2020   Atrial fibrillation with RVR (HCC) 05/19/2017   Back pain/sacroiliitis--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   Benign paroxysmal positional  vertigo due to bilateral vestibular disorder 05/20/2019   Bipolar 1 disorder (HCC) 01/23/2015   with GAD, benzo dependence   CAD (coronary artery disease)    Nonobstructive; Managed by Dr. Charls   Cardiomegaly 01/12/2018   CHF (congestive heart failure) 06/08/2022   Chronic back pain 01/04/2015   Chronic constipation 04/25/2020   Chronic diastolic heart failure (HCC) 05/30/2015   Chronic pain syndrome 08/22/2019   back pain, sacroiliitis   Chronic post-traumatic stress disorder (PTSD) 12/06/2020   Diabetic neuropathy (HCC) 02/06/2016   Dyslipidemia 09/24/2020   Essential hypertension    Functional diarrhea 10/26/2020   Herpes genitalis in women 07/16/2015   History of adenomatous polyp of colon 05/21/2019   Overview:   03/31/17: Colonoscopy: nonadvanced adenoma, microscopic colitis, f/u 5 yrs, [facility]/GAP   History of radiation therapy    Endometrial- 02/18/23-03/31/23-Dr. Lynwood Kinard   Hypotension 09/01/2022   Insomnia 01/23/2015   Metabolic acidosis 01/12/2023   Migraine headache with aura 02/12/2016   Myofascial pain dysfunction syndrome 08/22/2019   Non-alcoholic fatty liver disease 01/12/2018   OSA (obstructive sleep apnea) 02/24/2019   10/09/2018 - HST  - AHI 40.6    Osteopenia 12/06/2020   Rx alendronate  35 mg.   Pinguecula 12/06/2020   Presbyopia 12/06/2020   Pulmonary hypertension    RLS (restless legs syndrome) 04/27/2015   RSV (respiratory syncytial virus infection) 06/10/2023   Tremor, essential 12/11/2021   Type II diabetes mellitus (HCC) 09/24/2020      Dispostion: Discharged in stable condition.  Return precaution discussed.  Patient voices understanding and is in agreement with plan.   Final diagnoses:  Fall, initial encounter  Injury of head, initial encounter  Chest wall pain    ED Discharge Orders     None           Hildegard Loge, PA-C 12/27/23 1215    Charlyn Sora, MD 12/28/23 [phone]

## 2023-12-27 NOTE — Discharge Instructions (Signed)
 You are prescribed Augmentin  2 days ago.  Continue taking this.  This is also sufficient to cover pneumonia.  Continue taking your home medicine.  You are also given an incentive spirometer.  It is important that you use this to limit the chance of pneumonia worsening.  Follow-up with your primary care doctor for reevaluation later this week.  Return for concern of any symptoms. CT scan of the head, neck, mid back, low back, and chest did not show any concerning findings. CT chest showed potential start of a pneumonia.

## 2023-12-27 NOTE — ED Triage Notes (Signed)
 Pt from home for fall that happened around 12 am, fell going to the bathroom, Pt says she doesn't know what cause fall. Endorses hitting the back of her head on the floor, denies LOC. Pt endorses rib pain, chest pain and SOB.

## 2023-12-28 ENCOUNTER — Ambulatory Visit: Payer: Self-pay | Admitting: *Deleted

## 2023-12-28 ENCOUNTER — Telehealth: Payer: Self-pay

## 2023-12-28 ENCOUNTER — Encounter (HOSPITAL_COMMUNITY): Payer: Self-pay | Admitting: Emergency Medicine

## 2023-12-28 ENCOUNTER — Ambulatory Visit: Payer: Self-pay

## 2023-12-28 ENCOUNTER — Emergency Department (HOSPITAL_COMMUNITY)
Admission: EM | Admit: 2023-12-28 | Discharge: 2023-12-28 | Disposition: A | Discharge status: Home / Self Care | Attending: Emergency Medicine | Admitting: Emergency Medicine

## 2023-12-28 ENCOUNTER — Other Ambulatory Visit: Payer: Self-pay

## 2023-12-28 ENCOUNTER — Encounter: Admitting: Physical Medicine and Rehabilitation

## 2023-12-28 ENCOUNTER — Emergency Department (HOSPITAL_COMMUNITY)

## 2023-12-28 DIAGNOSIS — I3481 Nonrheumatic mitral (valve) annulus calcification: Secondary | ICD-10-CM | POA: Diagnosis not present

## 2023-12-28 DIAGNOSIS — Z96611 Presence of right artificial shoulder joint: Secondary | ICD-10-CM | POA: Diagnosis not present

## 2023-12-28 DIAGNOSIS — W19XXXA Unspecified fall, initial encounter: Secondary | ICD-10-CM

## 2023-12-28 DIAGNOSIS — Z91040 Latex allergy status: Secondary | ICD-10-CM | POA: Insufficient documentation

## 2023-12-28 DIAGNOSIS — W1830XA Fall on same level, unspecified, initial encounter: Secondary | ICD-10-CM | POA: Diagnosis not present

## 2023-12-28 DIAGNOSIS — S2232XA Fracture of one rib, left side, initial encounter for closed fracture: Secondary | ICD-10-CM | POA: Insufficient documentation

## 2023-12-28 DIAGNOSIS — R0789 Other chest pain: Secondary | ICD-10-CM | POA: Diagnosis not present

## 2023-12-28 DIAGNOSIS — R079 Chest pain, unspecified: Secondary | ICD-10-CM | POA: Diagnosis present

## 2023-12-28 DIAGNOSIS — Z794 Long term (current) use of insulin: Secondary | ICD-10-CM | POA: Insufficient documentation

## 2023-12-28 LAB — CBG MONITORING, ED: Glucose-Capillary: 81 mg/dL (ref 70–99)

## 2023-12-28 MED ORDER — HYDROMORPHONE HCL 1 MG/ML IJ SOLN
1.0000 mg | Freq: Once | INTRAMUSCULAR | Status: AC
  Administered 2023-12-28: 1 mg via INTRAMUSCULAR
  Filled 2023-12-28: qty 1

## 2023-12-28 MED ORDER — OXYCODONE-ACETAMINOPHEN 5-325 MG PO TABS: ORAL_TABLET | ORAL | 0 refills | Status: DC

## 2023-12-28 NOTE — ED Notes (Signed)
 ED Provider at bedside.

## 2023-12-28 NOTE — ED Provider Notes (Signed)
 [place] EMERGENCY DEPARTMENT AT [facility] Provider Note   CSN: [id number] Arrival date & time: 12/28/23  1145     Patient presents with: Amber Stephenson Pleas is a 78 y.o. female.  {Add pertinent medical, surgical, social history, OB history to YEP:67052} Patient with continuing left-sided chest pain.  Patient had a fall recently and had a CT scan of her chest, head, cervical spine, lumbar spine.  All of the scans were unremarkable.   Fall       Prior to Admission medications   Medication Sig Start Date End Date Taking? Authorizing Provider  oxyCODONE -acetaminophen  (PERCOCET/ROXICET) 5-325 MG tablet Take 1 every 12 hours if needed, in between your other pain medicines that you take 12 hours so you can take something about every 6 hours if needed 12/28/23  Yes Suzette Pac, MD  ALPRAZolam  (XANAX ) 1 MG tablet Take 1 mg by mouth. 12/21/23   [provider]  amoxicillin -clavulanate (AUGMENTIN ) 875-125 MG tablet Take 1 tablet by mouth every 12 (twelve) hours. 12/25/23   Francesca Elsie CROME, MD  ANUCORT-HC  25 MG suppository Place 25 mg rectally daily. 09/28/23   [provider]  ARIPiprazole  (ABILIFY ) 2 MG tablet Take 2 mg by mouth at bedtime. 10/07/21   [provider]  atorvastatin  (LIPITOR) 80 MG tablet Take 1 tablet (80 mg total) by mouth daily. 02/05/23 12/25/23  Conte, Tessa N, PA-C  carbidopa -levodopa  (SINEMET ) 10-100 MG tablet Take 1 tablet by mouth 4 (four) times daily. For restless leg syndrome 12/01/23   [name], MD  chlorhexidine  (PERIDEX ) 0.12 % solution Use as directed 5 mLs in the mouth or throat 2 (two) times daily. 12/09/23   [provider]  Continuous Glucose Sensor (DEXCOM G7 SENSOR) MISC CHANGE SENSOR EVERY 10 DAYS 09/28/23   Shamleffer, Donell Cardinal, MD  cyanocobalamin  (VITAMIN B12) 1000 MCG tablet Take 1 tablet (1,000 mcg total) by mouth daily. 09/04/22   Ghimire, Donalda HERO, MD  denosumab  (PROLIA ) 60 MG/ML SOSY  injection Inject 60 mg into the skin every 6 (six) months. 08/14/20   [name], MD  diphenoxylate -atropine  (LOMOTIL ) 2.5-0.025 MG tablet TAKE 2 TABLETS BY MOUTH FOUR TIMES DAILY AS NEEDED FOR DIARRHEA OR LOOSE STOOLS 11/24/23   [name], MD  ELIQUIS  5 MG TABS tablet TAKE 1 TABLET TWICE A DAY 11/02/23   Shlomo Wilbert SAUNDERS, MD  escitalopram  (LEXAPRO ) 10 MG tablet Take 10 mg by mouth daily.    [provider]  fluticasone -salmeterol (ADVAIR DISKUS) 500-50 MCG/ACT AEPB Inhale 1 puff into the lungs in the morning and at bedtime. 10/06/23   [name], MD  furosemide  (LASIX ) 40 MG tablet Take 40 mg by mouth daily. 09/18/23   [provider]  hydrochlorothiazide  (MICROZIDE ) 12.5 MG capsule Take 12.5 mg by mouth daily. 11/19/23   [provider]  hydrOXYzine  (VISTARIL ) 25 MG capsule Take 1 capsule (25 mg total) by mouth 3 (three) times daily. 12/01/23   [name], MD  insulin  glargine (LANTUS  SOLOSTAR) 100 UNIT/ML Solostar Pen Inject 30 Units into the skin 2 (two) times daily. 09/21/23   Shamleffer, Ibtehal Jaralla, MD  insulin  lispro (HUMALOG  KWIKPEN) 100 UNIT/ML KwikPen Max daily 45 units 05/22/23   Shamleffer, Ibtehal Jaralla, MD  Insulin  Pen Needle 29G X MISC 1 Device by Does not apply route daily in the afternoon. 05/22/23   Shamleffer, Ibtehal Jaralla, MD  LORazepam  (ATIVAN ) 0.5 MG tablet Take 0.5-1 mg by mouth in the morning, at noon, and at bedtime.  Take 0.5 mg by mouth in the morning, 0.5 mg at lunch, and 0.5 mg at bedtime    [provider]  losartan  (COZAAR ) 25 MG tablet Take 25 mg by mouth daily. 02/14/23   [provider]  MAGNESIUM  PO Take 1 tablet by mouth daily.    [provider]  meclizine  (ANTIVERT ) 25 MG tablet Take 1 tablet (25 mg total) by mouth 3 (three) times daily as needed for dizziness. 06/05/23   Dettinger, Fonda LABOR, MD  metFORMIN  (GLUCOPHAGE -XR) 500 MG 24 hr tablet Take 2 tablets (1,000 mg total) by mouth daily with  breakfast. 05/22/23   Shamleffer, Donell Cardinal, MD  Methocarbamol  1000 MG TABS Take 1,000 mg by mouth every 6 (six) hours as needed. 08/31/23   Lovorn, Megan, MD  metoprolol  tartrate (LOPRESSOR ) 50 MG tablet TAKE 1 TABLET TWICE A DAY 12/09/23   Shlomo Wilbert SAUNDERS, MD  omeprazole  (PRILOSEC) 40 MG capsule Take 1 capsule (40 mg total) by mouth 2 (two) times daily before a meal. 10/23/23   Rudy Josette RAMAN, PA-C  ondansetron  (ZOFRAN ) 4 MG tablet Take 1 tablet (4 mg total) by mouth every 8 (eight) hours as needed for nausea or vomiting. For cancer related nausea 10/23/23   Rudy Josette RAMAN, PA-C  OXcarbazepine  (TRILEPTAL ) 150 MG tablet Take 1 tablet (150 mg total) by mouth 2 (two) times daily. 06/13/22   Sherrill Cable Latif, DO  oxyCODONE  (OXYCONTIN ) 20 mg 12 hr tablet Take 1 tablet (20 mg total) by mouth every 12 (twelve) hours. 10/29/23   Debby Fidela CROME, NP  potassium chloride  (KLOR-CON ) 10 MEQ tablet Take 1 tablet (10 mEq total) by mouth daily. 02/05/23 12/25/23  Lucien Orren SAILOR, PA-C  rOPINIRole  (REQUIP ) 1 MG tablet Take 1 mg by mouth at bedtime.    [provider]  tirzepatide  (MOUNJARO ) 12.5 MG/0.5ML Pen Inject 12.5 mg into the skin once a week. 09/21/23   Shamleffer, Donell Cardinal, MD  Vitamin D , Cholecalciferol , 25 MCG (1000 UT) TABS Take 1,000 Units by mouth daily in the afternoon. 07/26/20   [provider]    Allergies: Iodinated contrast media, Iodine, Latex, Tape, and Tizanidine     Review of Systems  Updated Vital Signs BP (!) 156/89 (BP Location: Right Arm)   Pulse 69   Temp (!) 97.5 F (36.4 C)   Resp (!) 24   Ht 5' 2 (1.575 m)   Wt 79 kg   SpO2 97%   BMI 31.85 kg/m   Physical Exam  (all labs ordered are listed, but only abnormal results are displayed) Labs Reviewed - No data to display  EKG: None  Radiology: [facility] Chest Port 1 View Result Date: 12/28/2023 CLINICAL DATA:  [name] yesterday, left-sided chest wall pain EXAM: PORTABLE CHEST 1 VIEW COMPARISON:   12/27/2023 FINDINGS: Single frontal view of the chest demonstrates stable enlargement the cardiac silhouette and calcification of the mitral annulus. No acute airspace disease, effusion, or pneumothorax. Right shoulder arthroplasty again noted. There are no acute displaced fractures. Comparison with chest CT performed yesterday reveals a subtle minimally displaced left anterior fifth rib fracture in retrospect, reference image 85/9. IMPRESSION: 1. Subtle minimally displaced left anterior fifth rib fracture is seen on the comparison CT chest from 12/27/2023 in retrospect. This is not visible on today's x-ray. 2. Otherwise no acute intrathoracic process. 3. Stable enlarged cardiac silhouette and mitral annular calcification. Electronically Signed   By: Ozell Daring M.D.   On: 12/28/2023 14:03   CT Chest Wo Contrast Result  Date: 12/27/2023 CLINICAL DATA:  Blunt trauma to the chest. EXAM: CT CHEST WITHOUT CONTRAST TECHNIQUE: Multidetector CT imaging of the chest was performed following the standard protocol without IV contrast. RADIATION DOSE REDUCTION: This exam was performed according to the departmental dose-optimization program which includes automated exposure control, adjustment of the mA and/or kV according to patient size and/or use of iterative reconstruction technique. COMPARISON:  Chest CT dated 06/26/2023. FINDINGS: Evaluation of this exam is limited in the absence of intravenous contrast. Cardiovascular: There is mild cardiomegaly. No pericardial effusion. There is calcification of the mitral annulus. Mild atherosclerotic calcification of the thoracic aorta. No aneurysm or dilatation. The central pulmonary arteries are grossly unremarkable. Mediastinum/Nodes: No hilar or mediastinal adenopathy. The esophagus is grossly unremarkable. No mediastinal fluid collection. Lungs/Pleura: There is chronic interstitial coarsening. Clusters of ground-glass density in the right lower lobe new since the prior CT and  may represent developing infiltrate. Right upper lobe interstitial nodularity slightly progressed since the prior CT. No consolidative changes. There is no pleural effusion or pneumothorax. The central airways are patent. Upper Abdomen: No acute abnormality. Musculoskeletal: Right shoulder arthroplasty. No acute osseous pathology. Osteopenia with degenerative changes. IMPRESSION: 1. No acute/traumatic intrathoracic pathology. 2. Clusters of ground-glass density in the right lower lobe new since the prior CT and may represent developing infiltrate. 3.  Aortic Atherosclerosis (ICD10-I70.0). Electronically Signed   By: Vanetta Chou M.D.   On: 12/27/2023 11:24   CT T-SPINE NO CHARGE Result Date: 12/27/2023 CLINICAL DATA:  Trauma.  Fall. EXAM: CT THORACIC SPINE WITHOUT CONTRAST TECHNIQUE: Multiplanar CT images of the thoracic spine were reconstructed from contemporary CT of the Chest. RADIATION DOSE REDUCTION: This exam was performed according to the departmental dose-optimization program which includes automated exposure control, adjustment of the mA and/or kV according to patient size and/or use of iterative reconstruction technique. CONTRAST:  None. COMPARISON:  CT thoracic spine 11/11/2021. CTA chest, abdomen, and pelvis 06/26/2023. FINDINGS: Alignment: Minimal scoliosis.  No significant listhesis. Vertebrae: Unchanged mild T1, T9, and T11 compression fractures and T12 superior endplate Schmorl's node. No acute fracture or suspicious lesion. Paraspinal and other soft tissues: No acute abnormality in the paraspinal soft tissues. Remainder of the chest reported separately. Disc levels: Mild disc and facet degeneration without evidence of high-grade stenosis. IMPRESSION: 1. No acute osseous abnormality in the thoracic spine. 2. Unchanged multiple mild chronic compression fractures. Electronically Signed   By: Dasie Hamburg M.D.   On: 12/27/2023 11:15   CT Cervical Spine Wo Contrast Result Date: 12/27/2023 EXAM:  CT CERVICAL SPINE WITHOUT CONTRAST 12/27/2023 10:47:25 AM TECHNIQUE: CT of the cervical spine was performed without the administration of intravenous contrast. Multiplanar reformatted images are provided for review. Automated exposure control, iterative reconstruction, and/or weight based adjustment of the mA/kV was utilized to reduce the radiation dose to as low as reasonably achievable. COMPARISON: CT of the cervical spine dated 12/18/2023. CLINICAL HISTORY: Polytrauma, blunt. Trauma; from home for fall that happened around 12 am, fell going to the bathroom, Pt says she doesn't know what cause fall. Endorses hitting the back of her head on the floor, denies LOC. Pt endorses rib pain, chest pain and SOB. FINDINGS: CERVICAL SPINE: BONES AND ALIGNMENT: No acute fracture or traumatic malalignment. Straightening and slight reversal of the normal cervical lordosis is stable. DEGENERATIVE CHANGES: Facet degenerative changes are somewhat more prominent on the left. Uncovertebral spurring is greatest at C6-7, left greater than right. SOFT TISSUES: No prevertebral soft tissue swelling. IMPRESSION: 1. No acute  abnormality of the cervical spine related to the reported trauma. 2. Stable straightening and slight reversal of the normal cervical lordosis. 3. Facet degenerative changes, somewhat more prominent on the left. 4. Uncovertebral spurring, greatest at C6-7, left greater than right. Electronically signed by: Lonni Necessary MD 12/27/2023 11:12 AM EDT RP Workstation: [id number]   CT Lumbar Spine Wo Contrast Result Date: 12/27/2023 CLINICAL DATA:  Low back pain, trauma.  Fall. EXAM: CT LUMBAR SPINE WITHOUT CONTRAST TECHNIQUE: Multidetector CT imaging of the lumbar spine was performed without intravenous contrast administration. Multiplanar CT image reconstructions were also generated. RADIATION DOSE REDUCTION: This exam was performed according to the departmental dose-optimization program which includes automated  exposure control, adjustment of the mA and/or kV according to patient size and/or use of iterative reconstruction technique. COMPARISON:  CT abdomen and pelvis 12/25/2023. CT lumbar spine 11/11/2021. FINDINGS: Segmentation: 5 lumbar type vertebrae. Alignment: Mild lumbar levoscoliosis. Unchanged trace retrolisthesis of L2 on L3. Vertebrae: Unchanged mild chronic L2 superior endplate compression fracture. Degenerative endplate changes at L4-5 including unchanged L4 inferior endplate Schmorl's node. No acute fracture or suspicious lesion. Paraspinal and other soft tissues: Abdominal aortic atherosclerosis without aneurysm. Disc levels: Mild disc space narrowing at L1-2, L3-4, and L4-5. Predominantly mild disc bulging throughout the lumbar spine. Severe facet arthrosis on the right at L2-3 and L3-4 and bilaterally at L4-5 and L5-S1. No evidence of high-grade spinal canal or neural foraminal stenosis. IMPRESSION: 1. No acute osseous abnormality. 2. Unchanged mild chronic L2 compression fracture. 3. Lumbar disc and facet degeneration without evidence of high-grade stenosis. 4.  Aortic Atherosclerosis (ICD10-I70.0). Electronically Signed   By: Dasie Hamburg M.D.   On: 12/27/2023 11:08   CT Head Wo Contrast Result Date: 12/27/2023 EXAM: CT HEAD WITHOUT CONTRAST 12/27/2023 10:47:25 AM TECHNIQUE: CT of the head was performed without the administration of intravenous contrast. Automated exposure control, iterative reconstruction, and/or weight based adjustment of the mA/kV was utilized to reduce the radiation dose to as low as reasonably achievable. COMPARISON: None available. CLINICAL HISTORY: Polytrauma, blunt. Trauma; from home for fall that happened around 12 am, fell going to the bathroom, Pt says she doesn't know what cause fall. Endorses hitting the back of her head on the floor, denies LOC. Pt endorses rib pain, chest pain and SOB. FINDINGS: BRAIN AND VENTRICLES: No acute hemorrhage. Gray-white differentiation is  preserved. No hydrocephalus. No extra-axial collection. No mass effect or midline shift. Moderate periventricular subcortical white matter hypoattenuation is similar to the prior exam. Mild generalized atrophy is again noted. ORBITS: Bilateral lens replacements are noted. The globes and orbits are otherwise within normal limits. SINUSES: No acute abnormality. SOFT TISSUES AND SKULL: No acute soft tissue abnormality. No skull fracture. IMPRESSION: 1. No acute intracranial abnormality related to the reported trauma. 2. Moderate periventricular subcortical white matter hypoattenuation and mild generalized atrophy, similar to the prior exam. This likely reflects the sequelae of chronic microvascular ischemia. Electronically signed by: Lonni Necessary MD 12/27/2023 11:03 AM EDT RP Workstation: [id number]   DG Chest Portable 1 View Result Date: 12/27/2023 CLINICAL DATA:  Fall and left chest wall pain. EXAM: PORTABLE CHEST 1 VIEW COMPARISON:  Chest radiograph dated 12/18/2023. FINDINGS: No focal consolidation, pleural effusion, or pneumothorax. Mild cardiomegaly. No acute osseous pathology. Right shoulder arthroplasty. IMPRESSION: 1. No active disease. 2. Mild cardiomegaly. Electronically Signed   By: Vanetta Chou M.D.   On: 12/27/2023 10:44    {Document cardiac monitor, telemetry assessment procedure when appropriate:32947} Procedures   Medications Ordered in  the ED  HYDROmorphone  (DILAUDID ) injection 1 mg (has no administration in time range)  HYDROmorphone  (DILAUDID ) injection 1 mg (1 mg Intramuscular Given 12/28/23 1324)      {Click here for ABCD2, HEART and other calculators REFRESH Note before signing:1}                              Medical Decision Making Amount and/or Complexity of Data Reviewed Radiology: ordered.  Risk Prescription drug management.   Patient with a left fifth rib fracture.  She is given Percocets to take every 12 hours to help with breakthrough pain.  She is  already on OxyContin  every 12 hours  {Document critical care time when appropriate  Document review of labs and clinical decision tools ie CHADS2VASC2, etc  Document your independent review of radiology images and any outside records  Document your discussion with family members, caretakers and with consultants  Document social determinants of health affecting pt's care  Document your decision making why or why not admission, treatments were needed:32947:::1}   Final diagnoses:  Fall, initial encounter    ED Discharge Orders          Ordered    oxyCODONE -acetaminophen  (PERCOCET/ROXICET) 5-325 MG tablet        12/28/23 1431

## 2023-12-28 NOTE — ED Triage Notes (Signed)
 Seen yesterday for a fall, still having pain on LT side/ breast area.  States she feels like its her rib.  Seen here yesterday and all scans were negative.

## 2023-12-28 NOTE — Telephone Encounter (Signed)
 FYI Only or Action Required?: Action required by provider: request for appointment.  Patient was last seen in primary care on 12/25/2023 by Dettinger, Fonda LABOR, MD.  Called Nurse Triage reporting Blood Sugar Problem.  Symptoms began yesterday.  Interventions attempted: Prescription medications: OJ, pain medication, .  Symptoms are: stable.  Triage Disposition: Home Care  Patient/caregiver understands and will follow disposition?: Yes                 Copied from CRM [id number]. Topic: Clinical - Red Word Triage >> Dec 28, 2023  4:09 PM Ivette P wrote: Red Word that prompted transfer to Nurse Triage: Low blood sugar Reason for Disposition  Low blood sugar definition and treatment, questions about  Answer Assessment - Initial Assessment Questions Hospital f/u appt scheduled for 01/26/24. Please advise if patient can be seen earlier. Recommended to monitor blood glucose and eat appropriately. If pain worsens call back or go back to ED.       1. SYMPTOMS: What symptoms are you concerned about?    Hospital f/u appt after fall went to ED for pain and low blood sugar of 64. Treated in ED and blood sugar up to 80, reports dexcom scanner changed and blood sugar now 121.  2. ONSET:  When did the symptoms start?     Today , see previous encounter 3. BLOOD GLUCOSE: What is your blood glucose level?      121 4. USUAL RANGE: What is your blood glucose level usually? (e.g., usual fasting morning value, usual evening value)     Na  5. TYPE 1 or 2:  Do you know what type of diabetes you have?  (e.g., Type 1, Type 2, Gestational; doesn't know)      Na  6. INSULIN : Do you take insulin ? What type of insulin (s) do you use? What is the mode of delivery? (syringe, pen; injection or pump) When did you last give yourself an insulin  dose? (i.e., time or hours/minutes ago) How much did you give? (i.e., how many units)     Na  7. DIABETES PILLS: Do you take any pills for your  diabetes? If Yes, ask: What is the name of the medicine(s) that you take for high blood sugar?     See med list  8. OTHER SYMPTOMS: Do you have any symptoms? (e.g., fever, frequent urination, difficulty breathing, vomiting)      Pain left rib area 9. LOW BLOOD GLUCOSE TREATMENT: What have you done so far to treat the low blood glucose level?     See ED encounter 10. FOOD: When did you last eat or drink?       See ED note 11. ALONE: Are you alone right now or is someone with you?        Family  12. PREGNANCY: Is there any chance you are pregnant? When was your last menstrual period?       Na  Protocols used: Diabetes - Low Blood Sugar-A-AH

## 2023-12-28 NOTE — Telephone Encounter (Signed)
 FYI: Patient left a vm stating that her blood sugar we low on Friday at 4:41pm. I contact patient this morning when I return to office and advised that she needs to always press option for clinical nurse or front desk to speak with someone right away for blood sugars.  Patient states that she went to UC and the determined that her Dexcom was not working properly and she was not actually having any LBS.

## 2023-12-28 NOTE — Telephone Encounter (Signed)
Noted  -LS

## 2023-12-28 NOTE — Telephone Encounter (Signed)
 Pt had telephone visit on 12/23/23. Ok to hold Eliquis 

## 2023-12-28 NOTE — Discharge Instructions (Signed)
Follow-up with your family doctor if any problems 

## 2023-12-28 NOTE — Telephone Encounter (Signed)
 FYI Only or Action Required?: FYI only for provider.  Patient was last seen in primary care on 12/25/2023 by Dettinger, Fonda LABOR, MD.  Called Nurse Triage reporting Hospitalization Follow-up, Chest Pain, Shortness of Breath, and Wheezing.  Symptoms began after discharge from ED yesterday.  Interventions attempted: Other: ED for fall and rib injury yesterday.  Symptoms are: rapidly worsening.  Triage Disposition: Call EMS 911 Now  Patient/caregiver understands and will follow disposition?: No, refuses disposition      Copied from CRM #[id number]. Topic: Clinical - Red Word Triage >> Dec 28, 2023 10:35 AM Antonio DEL wrote: Red Word that prompted transfer to Nurse Triage: Patient was recently discharged from ED last night, pain of 8 out of 10 in ribs.  Agent reporting squeaking from breathing for pt.     Reason for Disposition  Sounds like a life-threatening emergency to the triager  Answer Assessment - Initial Assessment Questions Per pt chart, pt seen in ED yesterday after fall with rib injury  1. RESPIRATORY STATUS: Describe your breathing? (e.g., wheezing, shortness of breath, unable to speak, severe coughing)     Nurse hearing severe wheezing over phone, pt speaking in phrases of 2-4 words, Pt confirms that she is SOB and wheezing 2. ONSET: When did this breathing problem begin?      SOB and wheezing yes, been happening since was discharged No SOB and wheezing in hospital, only gave med injection 4. SEVERITY: How bad is your breathing? (e.g., mild, moderate, severe)      Severe per nurse  Wanting to do appt, advised need more resources than what office can provide for her severity of symptoms   Advised pt be examined in hospital right away, advised could call 911 or have another adult drive her asap if not [phone]. Pt will have someone drive her, advised call [phone] for any worsening.  Protocols used: Breathing Difficulty-A-AH

## 2023-12-29 NOTE — Telephone Encounter (Signed)
 Pt rescheduled for a appropriate slot.

## 2023-12-30 ENCOUNTER — Emergency Department (HOSPITAL_COMMUNITY)

## 2023-12-30 ENCOUNTER — Emergency Department (HOSPITAL_COMMUNITY)
Admission: EM | Admit: 2023-12-30 | Discharge: 2023-12-30 | Disposition: A | Discharge status: Home / Self Care | Source: Ambulatory Visit | Attending: Emergency Medicine | Admitting: Emergency Medicine

## 2023-12-30 ENCOUNTER — Other Ambulatory Visit: Payer: Self-pay

## 2023-12-30 ENCOUNTER — Encounter (HOSPITAL_COMMUNITY): Payer: Self-pay | Admitting: Emergency Medicine

## 2023-12-30 DIAGNOSIS — R0781 Pleurodynia: Secondary | ICD-10-CM | POA: Diagnosis not present

## 2023-12-30 DIAGNOSIS — Z7901 Long term (current) use of anticoagulants: Secondary | ICD-10-CM | POA: Diagnosis not present

## 2023-12-30 DIAGNOSIS — R0789 Other chest pain: Secondary | ICD-10-CM | POA: Diagnosis not present

## 2023-12-30 DIAGNOSIS — Z794 Long term (current) use of insulin: Secondary | ICD-10-CM | POA: Insufficient documentation

## 2023-12-30 DIAGNOSIS — R918 Other nonspecific abnormal finding of lung field: Secondary | ICD-10-CM | POA: Diagnosis not present

## 2023-12-30 DIAGNOSIS — Z91040 Latex allergy status: Secondary | ICD-10-CM | POA: Insufficient documentation

## 2023-12-30 DIAGNOSIS — S2232XA Fracture of one rib, left side, initial encounter for closed fracture: Secondary | ICD-10-CM | POA: Diagnosis not present

## 2023-12-30 DIAGNOSIS — R0989 Other specified symptoms and signs involving the circulatory and respiratory systems: Secondary | ICD-10-CM | POA: Diagnosis not present

## 2023-12-30 MED ORDER — DICLOFENAC EPOLAMINE 1.3 % EX PTCH
1.0000 | MEDICATED_PATCH | Freq: Two times a day (BID) | CUTANEOUS | Status: DC
  Administered 2023-12-30: 1 via TRANSDERMAL
  Filled 2023-12-30: qty 1

## 2023-12-30 MED ORDER — GABAPENTIN 300 MG PO CAPS
300.0000 mg | ORAL_CAPSULE | Freq: Once | ORAL | Status: AC
  Administered 2023-12-30: 300 mg via ORAL
  Filled 2023-12-30: qty 1

## 2023-12-30 NOTE — ED Provider Notes (Signed)
 [place] EMERGENCY DEPARTMENT AT [facility] Provider Note   CSN: [id number] Arrival date & time: 12/30/23  9288     Patient presents with: Rib Injury   Amber Stephenson is a 78 y.o. female.   HPI Patient presents with her husband who assists with the history.  Patient had a fall last week, has been struggling with right-sided rib pain since that event.  Today she took her previously scheduled narcotic several hours early and had no relief.  No fever, syncope, additional falls.  With ongoing severe pain she presents for evaluation.  Patient had scheduled follow-up 2 days ago with her spine rehab specialist.  She missed that appointment secondary to pain and evaluation here.  She is scheduled to follow-up with her pain management doctor in 2 days.    Prior to Admission medications   Medication Sig Start Date End Date Taking? Authorizing Provider  oxyCODONE  (OXYCONTIN ) 20 mg 12 hr tablet Take 1 tablet (20 mg total) by mouth every 12 (twelve) hours. 10/29/23  Yes Debby Fidela CROME, NP  ALPRAZolam  (XANAX ) 1 MG tablet Take 1 mg by mouth. 12/21/23   [provider]  amoxicillin -clavulanate (AUGMENTIN ) 875-125 MG tablet Take 1 tablet by mouth every 12 (twelve) hours. 12/25/23   Francesca Elsie CROME, MD  ANUCORT-HC  25 MG suppository Place 25 mg rectally daily. 09/28/23   [provider]  ARIPiprazole  (ABILIFY ) 2 MG tablet Take 2 mg by mouth at bedtime. 10/07/21   [provider]  atorvastatin  (LIPITOR) 80 MG tablet Take 1 tablet (80 mg total) by mouth daily. 02/05/23 12/25/23  Lucien Orren SAILOR, PA-C  carbidopa -levodopa  (SINEMET ) 10-100 MG tablet Take 1 tablet by mouth 4 (four) times daily. For restless leg syndrome 12/01/23   [name], MD  chlorhexidine  (PERIDEX ) 0.12 % solution Use as directed 5 mLs in the mouth or throat 2 (two) times daily. 12/09/23   [provider]  Continuous Glucose Sensor (DEXCOM G7 SENSOR) MISC CHANGE SENSOR EVERY 10 DAYS 09/28/23    Shamleffer, Donell Cardinal, MD  cyanocobalamin  (VITAMIN B12) 1000 MCG tablet Take 1 tablet (1,000 mcg total) by mouth daily. 09/04/22   Ghimire, Donalda HERO, MD  denosumab  (PROLIA ) 60 MG/ML SOSY injection Inject 60 mg into the skin every 6 (six) months. 08/14/20   [name], MD  diphenoxylate -atropine  (LOMOTIL ) 2.5-0.025 MG tablet TAKE 2 TABLETS BY MOUTH FOUR TIMES DAILY AS NEEDED FOR DIARRHEA OR LOOSE STOOLS 11/24/23   [name], MD  ELIQUIS  5 MG TABS tablet TAKE 1 TABLET TWICE A DAY 11/02/23   Shlomo Wilbert SAUNDERS, MD  escitalopram  (LEXAPRO ) 10 MG tablet Take 10 mg by mouth daily.    [provider]  fluticasone -salmeterol (ADVAIR DISKUS) 500-50 MCG/ACT AEPB Inhale 1 puff into the lungs in the morning and at bedtime. 10/06/23   [name], MD  furosemide  (LASIX ) 40 MG tablet Take 40 mg by mouth daily. 09/18/23   [provider]  hydrochlorothiazide  (MICROZIDE ) 12.5 MG capsule Take 12.5 mg by mouth daily. 11/19/23   [provider]  hydrOXYzine  (VISTARIL ) 25 MG capsule Take 1 capsule (25 mg total) by mouth 3 (three) times daily. 12/01/23   [name], MD  insulin  glargine (LANTUS  SOLOSTAR) 100 UNIT/ML Solostar Pen Inject 30 Units into the skin 2 (two) times daily. 09/21/23   Shamleffer, Ibtehal Jaralla, MD  insulin  lispro (HUMALOG  KWIKPEN) 100 UNIT/ML KwikPen Max daily 45 units 05/22/23   Shamleffer, Ibtehal Jaralla, MD  Insulin  Pen Needle 29G X MISC 1  Device by Does not apply route daily in the afternoon. 05/22/23   Shamleffer, Ibtehal Jaralla, MD  LORazepam  (ATIVAN ) 0.5 MG tablet Take 0.5-1 mg by mouth in the morning, at noon, and at bedtime. Take 0.5 mg by mouth in the morning, 0.5 mg at lunch, and 0.5 mg at bedtime    [provider]  losartan  (COZAAR ) 25 MG tablet Take 25 mg by mouth daily. 02/14/23   [provider]  MAGNESIUM  PO Take 1 tablet by mouth daily.    [provider]  meclizine  (ANTIVERT ) 25 MG tablet Take 1 tablet (25 mg  total) by mouth 3 (three) times daily as needed for dizziness. 06/05/23   Dettinger, Fonda LABOR, MD  metFORMIN  (GLUCOPHAGE -XR) 500 MG 24 hr tablet Take 2 tablets (1,000 mg total) by mouth daily with breakfast. 05/22/23   Shamleffer, Donell Cardinal, MD  Methocarbamol  1000 MG TABS Take 1,000 mg by mouth every 6 (six) hours as needed. 08/31/23   Lovorn, Megan, MD  metoprolol  tartrate (LOPRESSOR ) 50 MG tablet TAKE 1 TABLET TWICE A DAY 12/09/23   Shlomo Wilbert SAUNDERS, MD  omeprazole  (PRILOSEC) 40 MG capsule Take 1 capsule (40 mg total) by mouth 2 (two) times daily before a meal. 10/23/23   Rudy Josette RAMAN, PA-C  ondansetron  (ZOFRAN ) 4 MG tablet Take 1 tablet (4 mg total) by mouth every 8 (eight) hours as needed for nausea or vomiting. For cancer related nausea 10/23/23   Rudy Josette RAMAN, PA-C  OXcarbazepine  (TRILEPTAL ) 150 MG tablet Take 1 tablet (150 mg total) by mouth 2 (two) times daily. 06/13/22   Sherrill Cable Latif, DO  oxyCODONE -acetaminophen  (PERCOCET/ROXICET) 5-325 MG tablet Take 1 every 12 hours if needed, in between your other pain medicines that you take 12 hours so you can take something about every 6 hours if needed 12/28/23   Zammit, Joseph, MD  potassium chloride  (KLOR-CON ) 10 MEQ tablet Take 1 tablet (10 mEq total) by mouth daily. 02/05/23 12/25/23  Conte, Tessa N, PA-C  rOPINIRole  (REQUIP ) 1 MG tablet Take 1 mg by mouth at bedtime.    [provider]  tirzepatide  (MOUNJARO ) 12.5 MG/0.5ML Pen Inject 12.5 mg into the skin once a week. 09/21/23   Shamleffer, Donell Cardinal, MD  Vitamin D , Cholecalciferol , 25 MCG (1000 UT) TABS Take 1,000 Units by mouth daily in the afternoon. 07/26/20   [provider]    Allergies: Iodinated contrast media, Iodine, Latex, Tape, and Tizanidine     Review of Systems  Updated Vital Signs BP (!) 156/83   Pulse 83   Ht 5' 2 (1.575 m)   Wt 78.9 kg   SpO2 91%   BMI 31.83 kg/m   Physical Exam Vitals and nursing note reviewed.  Constitutional:       Appearance: She is well-developed.     Comments: Uncomfortable appearing obese elderly female  HENT:     Head: Normocephalic and atraumatic.  Eyes:     Conjunctiva/sclera: Conjunctivae normal.  Cardiovascular:     Rate and Rhythm: Normal rate and regular rhythm.  Pulmonary:     Effort: Pulmonary effort is normal. No respiratory distress.     Breath sounds: Normal breath sounds. No stridor.  Abdominal:     General: There is no distension.  Skin:    General: Skin is warm and dry.  Neurological:     Mental Status: She is alert and oriented to person, place, and time.     Cranial Nerves: No cranial nerve deficit.  Psychiatric:  Mood and Affect: Mood normal.     (all labs ordered are listed, but only abnormal results are displayed) Labs Reviewed - No data to display  EKG: None  Radiology: DG Chest 2 View Result Date: 12/30/2023 EXAM: 2 VIEW(S) XRAY OF THE CHEST 12/30/2023 07:54:00 AM COMPARISON: 12/28/2023 CLINICAL HISTORY: L 5th rib Fx, concern for developing PNA. Per triage; Pt reports L sided rib pain from previous fall on 7/18. Has been seen 4 times since. Was given RX of narcotics but reports pain is not getting better. Transfers well. AxO VSS FINDINGS: LUNGS AND PLEURA: Retrocardiac atelectasis or infiltrates. No pneumothorax. No pleural effusion. Pulmonary vascular congestion. HEART AND MEDIASTINUM: Stable enlargement of the cardiomediastinal silhouette. Mitral annular calcification. BONES AND SOFT TISSUES: Known left fifth rib fracture is not well visualized. IMPRESSION: 1. Retrocardiac atelectasis or infiltrates. 2. Stable enlargement of the cardiomediastinal silhouette with mitral annular calcification. 3. Pulmonary vascular congestion. Electronically signed by: Norman Gatlin MD 12/30/2023 08:16 AM EDT RP Workstation: [id number]   DG Chest Port 1 View Result Date: 12/28/2023 CLINICAL DATA:  [name] yesterday, left-sided chest wall pain EXAM: PORTABLE CHEST 1 VIEW  COMPARISON:  12/27/2023 FINDINGS: Single frontal view of the chest demonstrates stable enlargement the cardiac silhouette and calcification of the mitral annulus. No acute airspace disease, effusion, or pneumothorax. Right shoulder arthroplasty again noted. There are no acute displaced fractures. Comparison with chest CT performed yesterday reveals a subtle minimally displaced left anterior fifth rib fracture in retrospect, reference image 85/9. IMPRESSION: 1. Subtle minimally displaced left anterior fifth rib fracture is seen on the comparison CT chest from 12/27/2023 in retrospect. This is not visible on today's x-ray. 2. Otherwise no acute intrathoracic process. 3. Stable enlarged cardiac silhouette and mitral annular calcification. Electronically Signed   By: Ozell Daring M.D.   On: 12/28/2023 14:03     Procedures   Medications Ordered in the ED  diclofenac  (FLECTOR ) 1.3 % 1 patch (1 patch Transdermal Patch Applied 12/30/23 0811)  gabapentin  (NEURONTIN ) capsule 300 mg (300 mg Oral Given 12/30/23 9188)                                    Medical Decision Making Patient with previously identified left fifth rib fracture now presents with persistent pain in spite of using home medications.  Chart review notable for prescriptions with substantial narcotics, benzodiazepine, likely contributing to refractory pain given the patient's tolerance to these meds.  Patient is awake and alert, afebrile, no hypoxia initially but pneumonia, is a concern. No early evidence for bacteremia, sepsis. Pulse ox 95% room air borderline cardiac 80 sinus normal  Amount and/or Complexity of Data Reviewed Independent Historian: spouse External Data Reviewed: notes. Radiology: ordered and independent interpretation performed. Decision-making details documented in ED Course.    Details: X-ray from earlier this week and CT reviewed.  Prior fifth left rib anterior fracture.  Risk Prescription drug management. Decision  regarding hospitalization. Diagnosis or treatment significantly limited by social determinants of health.   9:14 AM X-ray reviewed, discussed, no pneumonia, atelectasis.  Patient now sitting upright, speaking clearly, much more interactive and pleasant.  They have received instructions on incentive spirometer, analgesics, she has improved has no fever, is in no distress, will follow-up with her physician on Friday, 2 days.     Final diagnoses:  Chest wall pain    ED Discharge Orders     None  Garrick Charleston, MD 12/30/23 [phone]

## 2023-12-30 NOTE — ED Notes (Signed)
 Pt called out for Oxygen , explained to pt her O2 levels are 99 percent at this time.

## 2023-12-30 NOTE — ED Notes (Signed)
 ED Provider at bedside.

## 2023-12-30 NOTE — ED Triage Notes (Signed)
 Pt reports L sided rib pain from previous fall on 7/18. Has been seen 4 times since. Was given RX of narcotics but reports pain is not getting better. Transfers well. AxO VSS

## 2023-12-30 NOTE — ED Notes (Signed)
 Pt given incentive spirometer. Teachings completed. Pt and husband verbalized understanding

## 2023-12-31 ENCOUNTER — Encounter: Payer: Self-pay | Admitting: *Deleted

## 2023-12-31 ENCOUNTER — Telehealth: Payer: Self-pay | Admitting: *Deleted

## 2023-12-31 ENCOUNTER — Other Ambulatory Visit: Payer: Self-pay | Admitting: *Deleted

## 2023-12-31 ENCOUNTER — Ambulatory Visit: Payer: Self-pay

## 2023-12-31 MED ORDER — PEG 3350-KCL-NA BICARB-NACL 420 G PO SOLR: 4000.0000 mL | Freq: Once | ORAL | 0 refills | Status: AC

## 2023-12-31 NOTE — Telephone Encounter (Signed)
 Patient called back d/t being disconnected with previous triage nurse. States no changes since that triage and has an appt tomorrow.   ED precautions reviewed, pt verbalized understanding.  See request below for Rx.

## 2023-12-31 NOTE — Patient Outreach (Signed)
 Complex Care Management   Visit Note  12/31/2023  Name:  Amber Stephenson MRN: [id number] DOB: 07-11-1945  Situation: Referral received for Complex Care Management related to Diabetes with Complications and Atrial Fibrillation I obtained verbal consent from Parent.  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   Benign paroxysmal positional vertigo due to bilateral vestibular disorder 05/20/2019   Bipolar 1 disorder (HCC) 01/23/2015   with GAD, benzo dependence   CAD (coronary artery disease)    Nonobstructive; Managed by Dr. Charls   Cardiomegaly 01/12/2018   CHF (congestive heart failure) 06/08/2022   Chronic back pain 01/04/2015   Chronic constipation 04/25/2020   Chronic diastolic heart failure (HCC) 05/30/2015   Chronic pain syndrome 08/22/2019   back pain, sacroiliitis   Chronic post-traumatic stress disorder (PTSD) 12/06/2020   Diabetic neuropathy (HCC) 02/06/2016   Dyslipidemia 09/24/2020   Essential hypertension    Functional diarrhea 10/26/2020   Herpes genitalis in women 07/16/2015   History of adenomatous polyp of colon 05/21/2019   Overview:   03/31/17: Colonoscopy: nonadvanced adenoma, microscopic colitis, f/u 5 yrs, [facility]/GAP   History of radiation therapy    Endometrial- 02/18/23-03/31/23-Dr. Lynwood Kinard   Hypotension 09/01/2022   Insomnia 01/23/2015   Metabolic acidosis 01/12/2023   Migraine headache with aura 02/12/2016   Myofascial pain dysfunction syndrome 08/22/2019   Non-alcoholic fatty liver disease 01/12/2018   OSA (obstructive sleep apnea) 02/24/2019   10/09/2018 - HST  - AHI 40.6    Osteopenia 12/06/2020   Rx  alendronate  35 mg.   Pinguecula 12/06/2020   Presbyopia 12/06/2020   Pulmonary hypertension    RLS (restless legs syndrome) 04/27/2015   RSV (respiratory syncytial virus infection) 06/10/2023   Tremor, essential 12/11/2021   Type II diabetes mellitus (HCC) 09/24/2020    Assessment: Patient Reported Symptoms:  Cognitive Cognitive Status: Difficulties with attention and concentration Cognitive/Intellectual Conditions Management [RPT]: None reported or documented in medical history or problem list   Health Maintenance Behaviors: Annual physical exam Healing Pattern: Unsure Health Facilitated by: Rest  Neurological Neurological Review of Symptoms: No symptoms reported Neurological Management Strategies: Routine screening Neurological Self-Management Outcome: 4 (good)  HEENT HEENT Symptoms Reported: No symptoms reported HEENT Management Strategies: Medication therapy HEENT Self-Management Outcome: 4 (good)    Cardiovascular Cardiovascular Symptoms Reported: No symptoms reported Does patient have uncontrolled Hypertension?: No Cardiovascular Management Strategies: Routine screening Cardiovascular Self-Management Outcome: 4 (good)  Respiratory Respiratory Symptoms Reported: No symptoms reported Respiratory Management Strategies: Routine screening Respiratory Self-Management Outcome: 4 (good)  Endocrine      Gastrointestinal Gastrointestinal Symptoms Reported: Not assessed Gastrointestinal Self-Management Outcome: 4 (good)    Genitourinary Genitourinary Symptoms Reported: Not assessed    Integumentary Integumentary Symptoms Reported: Not assessed    Musculoskeletal Musculoskelatal Symptoms Reviewed: Not assessed        Psychosocial       Quality of Family Relationships: helpful, involved, supportive Do you feel physically threatened by others?: No      12/25/2023    9:57 AM  Depression screen PHQ 2/9  Decreased Interest 0  Down, Depressed, Hopeless 0  PHQ - 2 Score 0   Altered sleeping 0  Tired, decreased energy 0  Change in appetite 0  Feeling bad or failure about yourself  0  Trouble concentrating 0  Moving slowly or fidgety/restless 0  Suicidal thoughts 0  PHQ-9 Score 0  Difficult doing work/chores Not difficult at all    There were no vitals filed for this visit.  Medications Reviewed Today     Reviewed by Kay Hendricks MATSU, RN (Case Manager) on 12/31/23 at 1125  Med List Status: <None>   Medication Order Taking? Sig Documenting Provider Last Dose Status Informant  ALPRAZolam  (XANAX ) 1 MG tablet [id number] Yes Take 1 mg by mouth. [provider]  Active   amoxicillin -clavulanate (AUGMENTIN ) 875-125 MG tablet [id number] Yes Take 1 tablet by mouth every 12 (twelve) hours. Francesca Elsie CROME, MD  Active   ANUCORT-HC  25 MG suppository [id number]  Place 25 mg rectally daily.  Patient not taking: Reported on 12/31/2023   [provider]  Consider Medication Status and Discontinue Self  ARIPiprazole  (ABILIFY ) 2 MG tablet [id number] Yes Take 2 mg by mouth at bedtime. [provider]  Active Self  atorvastatin  (LIPITOR) 80 MG tablet [id number]  Take 1 tablet (80 mg total) by mouth daily.  Patient not taking: Reported on 12/31/2023   Lucien Orren SAILOR, [place]  Expired 12/25/23 2359 Self  bupivacaine -epinephrine  (MARCAINE  W/ EPI) 0.5% -1:200000 injection [id number]   Hollis, Kevin D, MD  Active   carbidopa -levodopa  (SINEMET ) 10-100 MG tablet [id number] Yes Take 1 tablet by mouth 4 (four) times daily. For restless leg syndrome [name], MD  Active   chlorhexidine  (PERIDEX ) 0.12 % solution [id number]  Use as directed 5 mLs in the mouth or throat 2 (two) times daily.  Patient not taking: Reported on 12/31/2023   [provider]  Active   Continuous Glucose Sensor (DEXCOM G7 SENSOR) MISC [id number] Yes CHANGE SENSOR EVERY 10 DAYS Shamleffer, Donell Cardinal, MD  Active Self  cyanocobalamin  (VITAMIN B12) 1000 MCG tablet [id number] Yes  Take 1 tablet (1,000 mcg total) by mouth daily. Raenelle Donalda HERO, MD  Active Self  denosumab  (PROLIA ) 60 MG/ML SOSY injection [id number] Yes Inject 60 mg into the skin every 6 (six) months. [name], MD  Active Self           Med Note DRENA, CHUCK MATSU [place] Oct 07, 2023  8:01 PM) January**  denosumab  (PROLIA ) injection 60 mg [id number]   Stacks, Warren, MD  Active   diphenoxylate -atropine  (LOMOTIL ) 2.5-0.025 MG tablet [id number] Yes TAKE 2 TABLETS BY MOUTH FOUR TIMES DAILY AS NEEDED FOR DIARRHEA OR LOOSE LEELAND [name], MD  Active   ELIQUIS  5 MG TABS tablet [id number] Yes TAKE 1 TABLET TWICE A DAY Turner, Traci R, MD  Active   escitalopram  (LEXAPRO ) 10 MG tablet [id number] Yes Take 10 mg by mouth daily. [provider]  Active Self           Med Note (WARD, ANGELICA G   Wed Oct 07, 2023  7:56 PM) Pt needs refill  fluticasone -salmeterol (ADVAIR DISKUS) 500-50 MCG/ACT AEPB [id number]  Inhale 1 puff into the lungs in the morning and at bedtime.  Patient not taking: Reported on 12/31/2023   [name], MD  Active Self           Med Note DRENA, CHUCK MATSU [place] Oct 07, 2023  8:09 PM) Pt has not started this medication yet   furosemide  (LASIX ) 40 MG tablet [id number] Yes Take 40 mg by mouth daily. [provider]  Active Self  hydrochlorothiazide  (MICROZIDE )  12.5 MG capsule [id number] Yes Take 12.5 mg by mouth daily. [provider]  Active   hydrOXYzine  (VISTARIL ) 25 MG capsule [id number] Yes Take 1 capsule (25 mg total) by mouth 3 (three) times daily. [name], MD  Active   insulin  glargine (LANTUS  SOLOSTAR) 100 UNIT/ML Solostar Pen [id number] Yes Inject 30 Units into the skin 2 (two) times daily. Shamleffer, Ibtehal Jaralla, MD  Active Self  insulin  lispro (HUMALOG  KWIKPEN) 100 UNIT/ML KwikPen [id number] Yes Max daily 45 units Shamleffer, Ibtehal Jaralla, MD  Active Self           Med Note (WARD, ANGELICA G   Wed Oct 07, 2023  7:57 PM) 12 units*  Insulin  Pen  Needle 29G X MISC [id number] Yes 1 Device by Does not apply route daily in the afternoon. Shamleffer, Donell Cardinal, MD  Active Self  LORazepam  (ATIVAN ) 0.5 MG tablet [id number]  Take 0.5-1 mg by mouth in the morning, at noon, and at bedtime. Take 0.5 mg by mouth in the morning, 0.5 mg at lunch, and 0.5 mg at bedtime  Patient not taking: Reported on 12/31/2023   [provider]  Active Self           Med Note (WARD, ANGELICA G   Wed Oct 07, 2023  7:56 PM) Pt needs refill  losartan  (COZAAR ) 25 MG tablet [id number] Yes Take 25 mg by mouth daily. [provider]  Active Self  MAGNESIUM  PO [id number] Yes Take 1 tablet by mouth daily. [provider]  Active Self  meclizine  (ANTIVERT ) 25 MG tablet [id number] Yes Take 1 tablet (25 mg total) by mouth 3 (three) times daily as needed for dizziness. Dettinger, Fonda LABOR, MD  Active Self  metFORMIN  (GLUCOPHAGE -XR) 500 MG 24 hr tablet [id number] Yes Take 2 tablets (1,000 mg total) by mouth daily with breakfast. Shamleffer, Ibtehal Jaralla, MD  Active Self  Methocarbamol  1000 MG TABS [id number] Yes Take 1,000 mg by mouth every 6 (six) hours as needed. Lovorn, Megan, MD  Active Self  metoprolol  tartrate (LOPRESSOR ) 50 MG tablet [id number] Yes TAKE 1 TABLET TWICE A DAY Turner, Wilbert SAUNDERS, MD  Active   omeprazole  (PRILOSEC) 40 MG capsule [id number]  Take 1 capsule (40 mg total) by mouth 2 (two) times daily before a meal.  Patient not taking: Reported on 12/31/2023   Rudy Josette RAMAN, PA-C  Consider Medication Status and Discontinue   ondansetron  (ZOFRAN ) 4 MG tablet [id number] Yes Take 1 tablet (4 mg total) by mouth every 8 (eight) hours as needed for nausea or vomiting. For cancer related nausea Rudy Josette RAMAN, PA-C  Active   OXcarbazepine  (TRILEPTAL ) 150 MG tablet [id number] Yes Take 1 tablet (150 mg total) by mouth 2 (two) times daily. Sheikh, Omair [place], [place]  Active Self  oxyCODONE  (OXYCONTIN ) 20 mg 12 hr tablet [id number] Yes Take 1 tablet  (20 mg total) by mouth every 12 (twelve) hours. Debby Fidela CROME, NP  Active   oxyCODONE -acetaminophen  (PERCOCET/ROXICET) 5-325 MG tablet [id number] Yes Take 1 every 12 hours if needed, in between your other pain medicines that you take 12 hours so you can take something about every 6 hours if needed Zammit, Joseph, MD  Active   polyethylene glycol-electrolytes (NULYTELY ) 420 g solution [id number]  Take 4,000 mLs by mouth once for 1 dose.  Patient not taking: Reported on 12/31/2023   Shaaron Lamar HERO, MD  Active   potassium chloride  (KLOR-CON ) 10 MEQ tablet [id number] Yes Take 1 tablet (10 mEq total) by mouth  daily. Lucien Orren SAILOR, PA-C  Active Self  rOPINIRole  (REQUIP ) 1 MG tablet [id number] Yes Take 1 mg by mouth at bedtime. [provider]  Active   tirzepatide  (MOUNJARO ) 12.5 MG/0.5ML Pen [id number] Yes Inject 12.5 mg into the skin once a week. Shamleffer, Donell Cardinal, MD  Active Self  Vitamin D , Cholecalciferol , 25 MCG (1000 UT) TABS [id number] Yes Take 1,000 Units by mouth daily in the afternoon. [provider]  Active Self  Med List Note Leonardo Suzen CROME, CPhT 08/29/22 1018): [employer] insurance             Recommendation:   Continue Current Plan of Care  Follow Up Plan:   Telephone follow-up in 1 month  Hendricks Her RN, BSN  [place] [facility] RN Case Manager   Direct Dial -[phone]

## 2023-12-31 NOTE — Telephone Encounter (Signed)
 Pt has been scheduled for 01/29/24. Instructions mailed and prep sent to pharmacy

## 2023-12-31 NOTE — Patient Instructions (Signed)
 Visit Information  Thank you for taking time to visit with me today. Please don't hesitate to contact me if I can be of assistance to you before our next scheduled appointment.  Your next care management appointment is by telephone on 01/28/2024 at 1:00 PM   Telephone follow-up in 1 month  Please call the care guide team at [phone] if you need to cancel, schedule, or reschedule an appointment.   Please call the Suicide and Crisis Lifeline: 988 call the [place]  National Suicide Prevention Lifeline: 301-545-1406 or TTY: 361-225-8368 TTY (708)515-9237) to talk to a trained counselor call 1-800-273-TALK (toll free, 24 hour hotline) call the [facility]: [phone] call 911 if you are experiencing a Mental Health or Behavioral Health Crisis or need someone to talk to.  Hendricks Her RN, BSN  [place] [facility] RN Case Manager   Direct Dial -[phone]

## 2023-12-31 NOTE — Telephone Encounter (Signed)
 noted

## 2023-12-31 NOTE — Telephone Encounter (Signed)
 FYI Only or Action Required?: Action required by provider: Patient requesting prescription for gabapentin  (NEURONTIN ) capsule 300 mg, appointment tomorrow.  Patient was last seen in primary care on 12/25/2023 by Dettinger, Fonda LABOR, MD.  Called Nurse Triage reporting Rib Injury.  Symptoms began a week ago.  Interventions attempted: Other: Has been seen and treated in the ED.  Symptoms are: unchanged.  Triage Disposition: See PCP When Office is Open (Within 3 Days)  Patient/caregiver understands and will follow disposition?: Yes     Copied from CRM #[id number]. Topic: Clinical - Red Word Triage >> Dec 31, 2023  9:41 AM Willma R wrote: [facility] that prompted transfer to Nurse Triage: Patient broke a rib last week and has been in the ER 3 times. Was given pain medication yesterday at the ER but now it has worn off an is a lot of pain now.     Reason for Disposition  [1] After 3 days AND [2] chest pain not improved  Answer Assessment - Initial Assessment Questions 1. MECHANISM: How did the injury happen?     Fell in bathroom  2. ONSET: When did the injury happen? (.e.g., minutes, hours, days ago)     1 week ago 3. LOCATION: Where on the chest is the injury located?     Left side  4. APPEARANCE: What does the injury look like?     No abnormality  5. BLEEDING: Is there any bleeding now? If Yes, ask: How long has it been bleeding?     No 7. SIZE: For cuts, bruises, or swelling, ask: How large is it? (e.g., inches or centimeters)     N/A 8. PAIN: Is there pain? If Yes, ask: How bad is the pain? (e.g., Scale 0-10; none, mild, moderate, severe)     Moderate to severe  Protocols used: Chest Injury-A-AH

## 2024-01-01 ENCOUNTER — Ambulatory Visit: Admitting: Physical Medicine and Rehabilitation

## 2024-01-01 ENCOUNTER — Encounter: Payer: Self-pay | Admitting: Family Medicine

## 2024-01-01 ENCOUNTER — Ambulatory Visit (INDEPENDENT_AMBULATORY_CARE_PROVIDER_SITE_OTHER): Admitting: Family Medicine

## 2024-01-01 VITALS — BP 132/75 | HR 97 | Ht 62.0 in | Wt 173.0 lb

## 2024-01-01 DIAGNOSIS — R296 Repeated falls: Secondary | ICD-10-CM

## 2024-01-01 DIAGNOSIS — L89321 Pressure ulcer of left buttock, stage 1: Secondary | ICD-10-CM | POA: Diagnosis not present

## 2024-01-01 DIAGNOSIS — S2232XD Fracture of one rib, left side, subsequent encounter for fracture with routine healing: Secondary | ICD-10-CM

## 2024-01-01 DIAGNOSIS — K5732 Diverticulitis of large intestine without perforation or abscess without bleeding: Secondary | ICD-10-CM

## 2024-01-01 MED ORDER — ONDANSETRON HCL 4 MG PO TABS: 4.0000 mg | ORAL_TABLET | Freq: Three times a day (TID) | ORAL | 1 refills | Status: AC | PRN

## 2024-01-01 MED ORDER — GABAPENTIN 100 MG PO CAPS: 100.0000 mg | ORAL_CAPSULE | Freq: Three times a day (TID) | ORAL | 3 refills | Status: DC

## 2024-01-01 MED ORDER — DICLOFENAC SODIUM 1 % EX GEL: 2.0000 g | Freq: Three times a day (TID) | CUTANEOUS | 1 refills | Status: DC | PRN

## 2024-01-01 NOTE — Progress Notes (Signed)
 BP 132/75   Pulse 97   Ht 5' 2 (1.575 m)   Wt 173 lb (78.5 kg)   SpO2 97%   BMI 31.64 kg/m    Subjective:   Patient ID: Amber Stephenson, female    DOB: 1946-05-19, 78 y.o.   MRN: [id number]  HPI: Amber Stephenson is a 78 y.o. female presenting on 01/01/2024 for Chest Pain (Rib pain- wants gabapentin  and diclofenac  sent in. Rx'd by ER and was helpful.)   Discussed the use of AI scribe software for clinical note transcription with the patient, who gave verbal consent to proceed.  History of Present Illness   Amber Stephenson is a 78 year old female who presents with rib pain following a fall.  One week ago, she experienced a fall while going to the bathroom, resulting in pain on her left side. Initially, she was unable to move and was taken to the emergency department by ambulance. Initial imaging, including X-ray and CT scan, did not reveal any fractures. However, by Monday, her pain had worsened, prompting a return visit to the emergency department where a fracture in the fifth rib was identified. She has been doing incentive spirometry to help prevent pneumonia.  The pain is exacerbated by certain movements but is manageable if she avoids those movements. She has been performing breathing exercises. She was given samples of diclofenac  and gabapentin  at the emergency department, which provided significant relief, but she did not receive a prescription for these medications.  She has been experiencing nausea and diarrhea while taking antibiotics for a urinary tract infection. She is nearing the end of her antibiotic course, with one dose remaining. She has not changed any other medications recently.  She mentions a sore on her cheek, which has been treated with polysporin, Vaseline, and a Band-Aid, and is showing improvement.  No lightheadedness, dizziness, or passing out at the time of her fall. She has not noticed any bruising on her skin, although she is on a blood thinner.           Relevant past medical, surgical, family and social history reviewed and updated as indicated. Interim medical history since our last visit reviewed. Allergies and medications reviewed and updated.  Review of Systems  Constitutional:  Negative for chills and fever.  Eyes:  Negative for visual disturbance.  Respiratory:  Negative for chest tightness and shortness of breath.   Cardiovascular:  Positive for chest pain. Negative for palpitations and leg swelling.  Genitourinary:  Negative for difficulty urinating and dysuria.  Musculoskeletal:  Positive for arthralgias and myalgias. Negative for back pain and gait problem.  Skin:  Positive for wound. Negative for rash.  Neurological:  Negative for light-headedness and headaches.  Psychiatric/Behavioral:  Negative for agitation and behavioral problems.   All other systems reviewed and are negative.   Per HPI unless specifically indicated above   Allergies as of 01/01/2024       Reactions   Iodinated Contrast Media Anaphylaxis, Swelling, Other (See Comments)   Throat closes, swelling of throat and tongue   Iodine Anaphylaxis   Latex Other (See Comments)   Latex tape pulls skin with it   Tape Other (See Comments)   Pulls off the skin, if latex   Tizanidine  Other (See Comments)   Weakness, goofy, bad dreams        Medication List        Accurate as of January 01, 2024  3:16 PM. If you have any questions,  ask your nurse or doctor.          STOP taking these medications    chlorhexidine  0.12 % solution Commonly known as: PERIDEX  Stopped by: Fonda LABOR Walter Min   fluticasone -salmeterol 500-50 MCG/ACT Aepb Commonly known as: Advair Diskus Stopped by: Fonda LABOR Zac Torti       TAKE these medications    ALPRAZolam  1 MG tablet Commonly known as: XANAX  Take 1 mg by mouth.   amoxicillin -clavulanate 875-125 MG tablet Commonly known as: AUGMENTIN  Take 1 tablet by mouth every 12 (twelve) hours.   Anucort-HC  25 MG  suppository Generic drug: hydrocortisone  Place 25 mg rectally daily.   ARIPiprazole  2 MG tablet Commonly known as: ABILIFY  Take 2 mg by mouth at bedtime.   atorvastatin  80 MG tablet Commonly known as: LIPITOR Take 1 tablet (80 mg total) by mouth daily.   carbidopa -levodopa  10-100 MG tablet Commonly known as: Sinemet  Take 1 tablet by mouth 4 (four) times daily. For restless leg syndrome   cyanocobalamin  1000 MCG tablet Commonly known as: VITAMIN B12 Take 1 tablet (1,000 mcg total) by mouth daily.   Dexcom G7 Sensor Misc CHANGE SENSOR EVERY 10 DAYS   diclofenac  Sodium 1 % Gel Commonly known as: VOLTAREN  Apply 2 g topically 3 (three) times daily as needed. Started by: Fonda LABOR Sharon Rubis   diphenoxylate -atropine  2.5-0.025 MG tablet Commonly known as: LOMOTIL  TAKE 2 TABLETS BY MOUTH FOUR TIMES DAILY AS NEEDED FOR DIARRHEA OR LOOSE STOOLS   Eliquis  5 MG Tabs tablet Generic drug: apixaban  TAKE 1 TABLET TWICE A DAY   escitalopram  10 MG tablet Commonly known as: LEXAPRO  Take 10 mg by mouth daily.   furosemide  40 MG tablet Commonly known as: LASIX  Take 40 mg by mouth daily.   gabapentin  100 MG capsule Commonly known as: NEURONTIN  Take 1 capsule (100 mg total) by mouth 3 (three) times daily. Started by: Fonda LABOR Georgio Hattabaugh   hydrochlorothiazide  12.5 MG capsule Commonly known as: MICROZIDE  Take 12.5 mg by mouth daily.   hydrOXYzine  25 MG capsule Commonly known as: VISTARIL  Take 1 capsule (25 mg total) by mouth 3 (three) times daily.   insulin  lispro 100 UNIT/ML KwikPen Commonly known as: HumaLOG  KwikPen Max daily 45 units   Insulin  Pen Needle 29G X Misc 1 Device by Does not apply route daily in the afternoon.   Lantus  SoloStar 100 UNIT/ML Solostar Pen Generic drug: insulin  glargine Inject 30 Units into the skin 2 (two) times daily.   LORazepam  0.5 MG tablet Commonly known as: ATIVAN  Take 0.5-1 mg by mouth in the morning, at noon, and at bedtime. Take 0.5  mg by mouth in the morning, 0.5 mg at lunch, and 0.5 mg at bedtime   losartan  25 MG tablet Commonly known as: COZAAR  Take 25 mg by mouth daily.   MAGNESIUM  PO Take 1 tablet by mouth daily.   meclizine  25 MG tablet Commonly known as: ANTIVERT  Take 1 tablet (25 mg total) by mouth 3 (three) times daily as needed for dizziness.   metFORMIN  500 MG 24 hr tablet Commonly known as: GLUCOPHAGE -XR Take 2 tablets (1,000 mg total) by mouth daily with breakfast.   Methocarbamol  1000 MG Tabs Take 1,000 mg by mouth every 6 (six) hours as needed.   metoprolol  tartrate 50 MG tablet Commonly known as: LOPRESSOR  TAKE 1 TABLET TWICE A DAY   omeprazole  40 MG capsule Commonly known as: PRILOSEC Take 1 capsule (40 mg total) by mouth 2 (two) times daily before a meal.   ondansetron  4  MG tablet Commonly known as: Zofran  Take 1 tablet (4 mg total) by mouth every 8 (eight) hours as needed for nausea or vomiting. For cancer related nausea   OXcarbazepine  150 MG tablet Commonly known as: TRILEPTAL  Take 1 tablet (150 mg total) by mouth 2 (two) times daily.   oxyCODONE  20 mg 12 hr tablet Commonly known as: OXYCONTIN  Take 1 tablet (20 mg total) by mouth every 12 (twelve) hours.   oxyCODONE -acetaminophen  5-325 MG tablet Commonly known as: PERCOCET/ROXICET Take 1 every 12 hours if needed, in between your other pain medicines that you take 12 hours so you can take something about every 6 hours if needed   potassium chloride  10 MEQ tablet Commonly known as: KLOR-CON  Take 1 tablet (10 mEq total) by mouth daily.   Prolia  60 MG/ML Sosy injection Generic drug: denosumab  Inject 60 mg into the skin every 6 (six) months.   rOPINIRole  1 MG tablet Commonly known as: REQUIP  Take 1 mg by mouth at bedtime.   tirzepatide  12.5 MG/0.5ML Pen Commonly known as: MOUNJARO  Inject 12.5 mg into the skin once a week.   Vitamin D  (Cholecalciferol ) 25 MCG (1000 UT) Tabs Take 1,000 Units by mouth daily in the  afternoon.         Objective:   BP 132/75   Pulse 97   Ht 5' 2 (1.575 m)   Wt 173 lb (78.5 kg)   SpO2 97%   BMI 31.64 kg/m   Wt Readings from Last 3 Encounters:  01/01/24 173 lb (78.5 kg)  12/30/23 174 lb (78.9 kg)  12/28/23 174 lb 2.6 oz (79 kg)    Physical Exam Vitals and nursing note reviewed.  Constitutional:      Appearance: She is well-developed.  Cardiovascular:     Rate and Rhythm: Normal rate and regular rhythm.     Heart sounds: No murmur heard. Pulmonary:     Effort: Pulmonary effort is normal.     Breath sounds: Normal breath sounds. No wheezing or rhonchi.  Chest:     Chest wall: Tenderness (Left lower chest wall tenderness, no overlying bruising) present.  Neurological:     Mental Status: She is alert.    Physical Exam   SKIN: Skin lesion on cheek is almost healed.         Assessment & Plan:   Problem List Items Addressed This Visit   None Visit Diagnoses       Closed fracture of one rib of left side with routine healing, subsequent encounter    -  Primary   Relevant Medications   gabapentin  (NEURONTIN ) 100 MG capsule   diclofenac  Sodium (VOLTAREN ) 1 % GEL   Other Relevant Orders   Ambulatory referral to Home Health     Diverticulitis of colon       Relevant Medications   ondansetron  (ZOFRAN ) 4 MG tablet   Other Relevant Orders   Ambulatory referral to Home Health     Pressure injury of left buttock, stage 1       Relevant Orders   Ambulatory referral to Home Health     Recurrent falls       Relevant Orders   Ambulatory referral to Home Health          Left fifth rib fracture with acute pain Confirmed fracture with persistent acute pain, complicated by anticoagulation therapy. - Prescribed gabapentin  for pain management, monitor for sedation and balance issues. - Prescribed diclofenac  gel for topical use, apply 3-4 times daily. - Encouraged  continuation of breathing exercises to prevent pneumonia. - Check on physical therapy  referral to [facility] for rehabilitation.  Adverse effect of antibiotics (nausea and diarrhea) Nausea and diarrhea likely secondary to antibiotics for UTI. - Recommend probiotics or Greek yogurt to mitigate gastrointestinal side effects. - Consider prescribing Zofran  if nausea persists.  Pressure sore of cheek, healing Pressure sore showing significant improvement. - Continue treatment with polysporin, Vaseline, and Band-Aid. - Encourage movement and use of a cushion donut to prevent further pressure sores.          Follow up plan: Return if symptoms worsen or fail to improve.  Counseling provided for all of the vaccine components Orders Placed This Encounter  Procedures   Ambulatory referral to Home Health    Fonda Levins, MD [facility] 01/01/2024, 3:16 PM

## 2024-01-01 NOTE — Telephone Encounter (Signed)
Attempted to call pt back, no answer.

## 2024-01-04 ENCOUNTER — Ambulatory Visit (HOSPITAL_COMMUNITY)

## 2024-01-04 ENCOUNTER — Other Ambulatory Visit: Payer: Self-pay | Admitting: Family Medicine

## 2024-01-04 DIAGNOSIS — M352 Behcet's disease: Secondary | ICD-10-CM

## 2024-01-05 ENCOUNTER — Telehealth: Payer: Self-pay

## 2024-01-05 ENCOUNTER — Other Ambulatory Visit (HOSPITAL_COMMUNITY)

## 2024-01-05 ENCOUNTER — Ambulatory Visit

## 2024-01-05 IMAGING — CT CT T SPINE W/O CM
3 series · 9 of 33 positions shown, 10 images · non-contrast
Comparison: None Available.

CLINICAL DATA: Trauma, fall, back pain

EXAM:
CT THORACIC SPINE WITHOUT CONTRAST
TECHNIQUE: Multidetector CT images of the thoracic were obtained using the
standard protocol without intravenous contrast.
RADIATION DOSE REDUCTION: This exam was performed according to the
departmental dose-optimization program which includes automated
exposure control, adjustment of the mA and/or kV according to
patient size and/or use of iterative reconstruction technique.

[Series 4: t spine soft · axial · 0.32mm/px · z∈[+1460,+1460]mm · 1 of 167 slices shown, 2 images]
[im 90/167  soft-tissue]
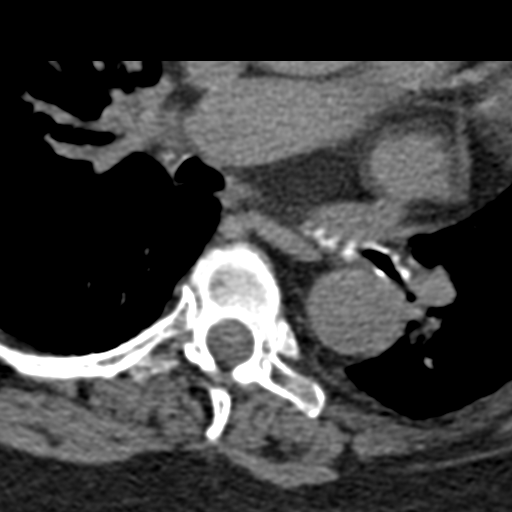
[im 90/167  bone]
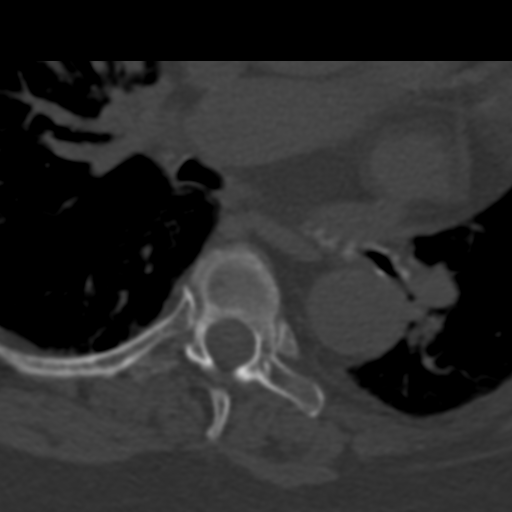

[Series 5: cor bone · coronal · 0.29mm/px · 3 of 72 slices shown]
[im 15/72  bone]
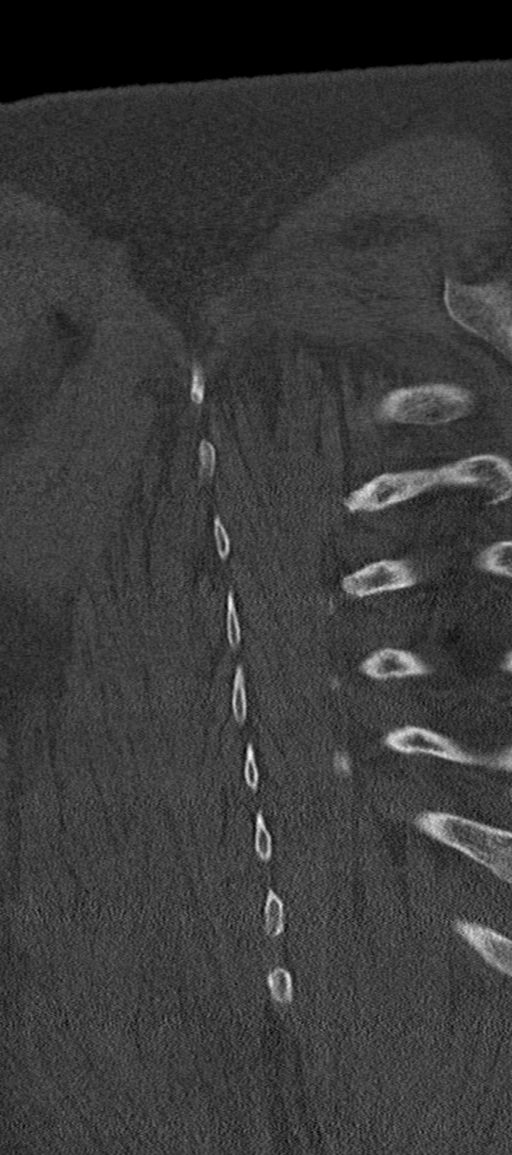
[im 29/72  bone]
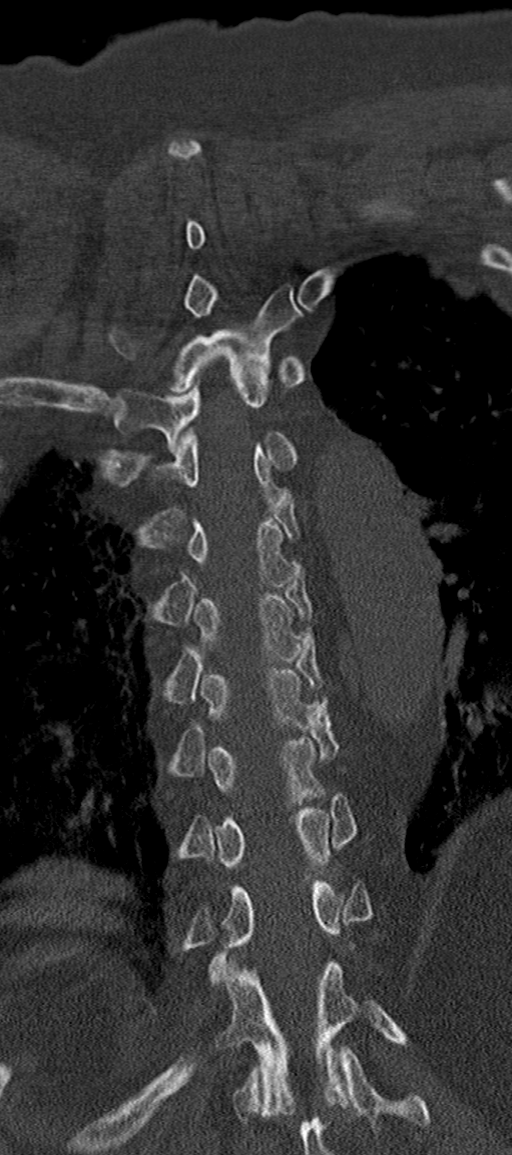
[im 43/72  bone]
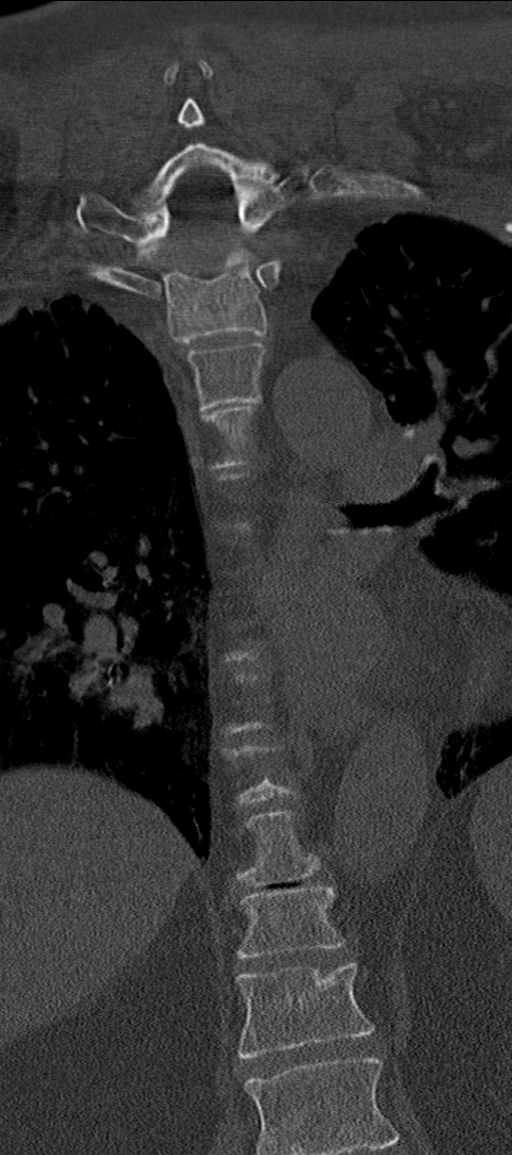

[Series 6: sag bone · sagittal · 0.32mm/px · 5 of 61 slices shown]
[im 21/61  bone]
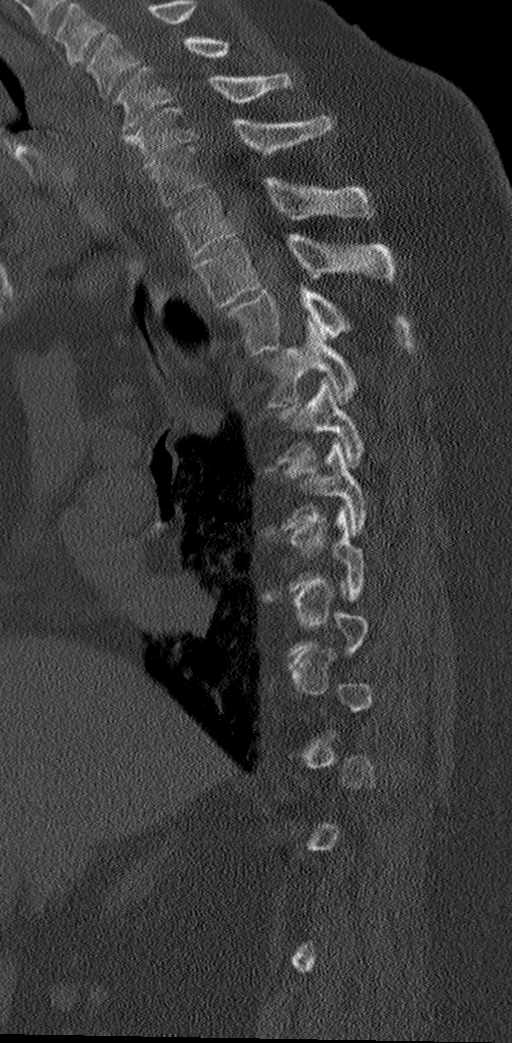
[im 26/61  bone]
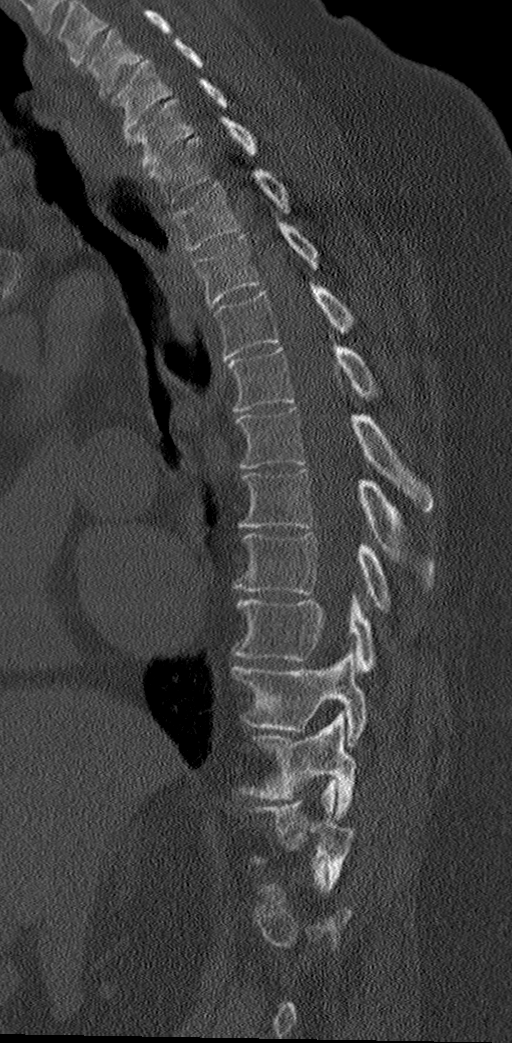
[im 31/61  bone]
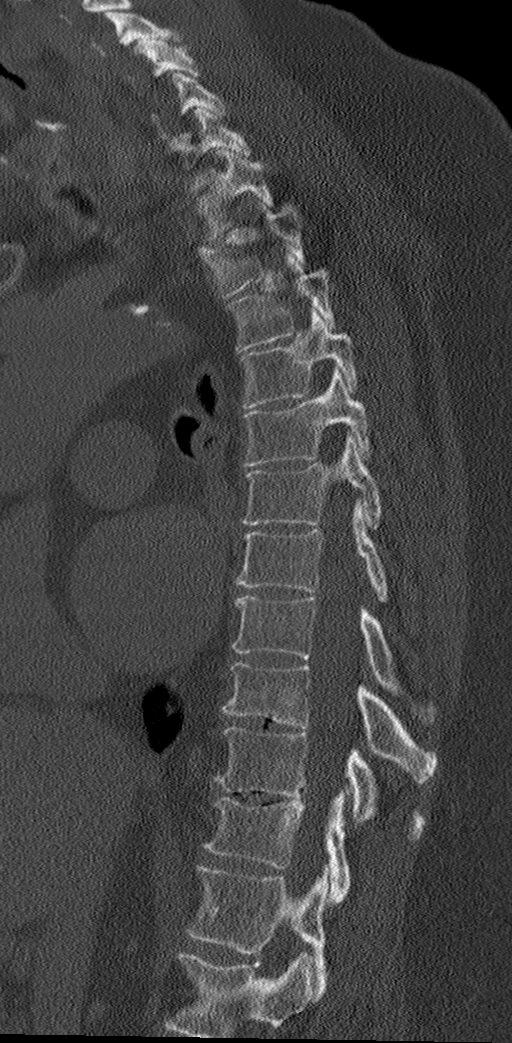
[im 36/61  bone]
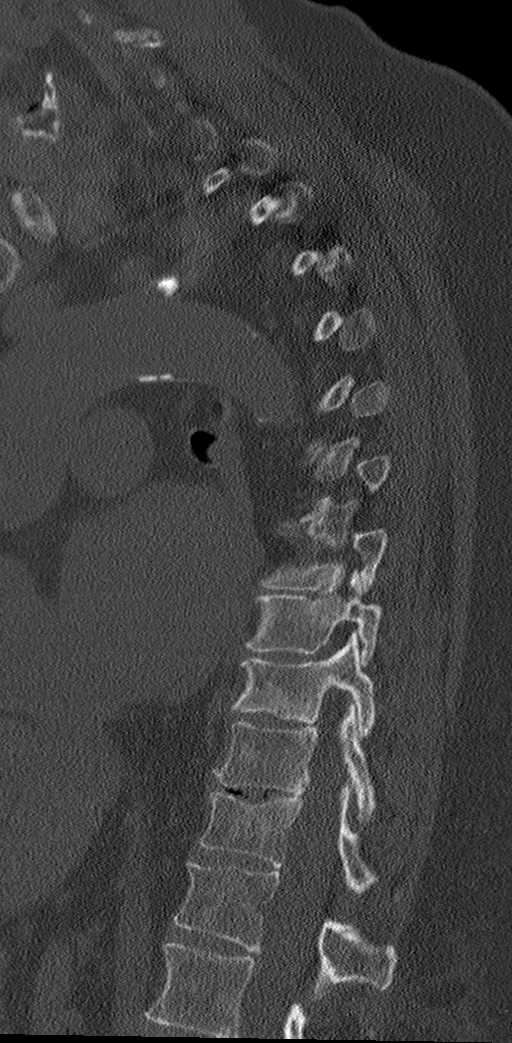
[im 41/61  bone]
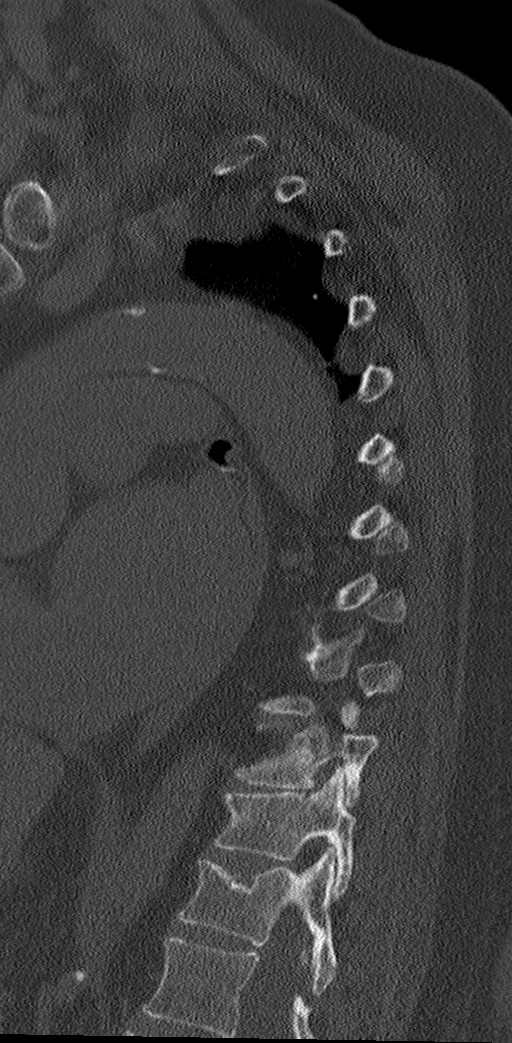

[9 of 33 positions shown; findings below may reference images not displayed]

FINDINGS: Alignment: Alignment of posterior margins of vertebral bodies is
unremarkable.

Vertebrae: There is mild 10% decrease in height of upper endplate of
body of T1 vertebra. There is mild decrease in height of lower
endplate of body of T9 and upper endplate of body of T11 vertebrae.
There are pockets of air in the T9-T10 and T10-T11 disc spaces.
There is sclerosis in the endplates adjacent to T9-T10 and T10-T11
disc spaces. Schmorl's node is seen in the upper endplate of body of
T12 vertebra.

Paraspinal and other soft tissues: Unremarkable.

Disc levels: There is no significant encroachment of neural
foramina.
IMPRESSION: There is mild 10% decrease in height of upper endplate of body of T1
vertebra suggesting recent or old mild compression. Degenerative
changes are noted in the disc spaces at T9-T10 and T10-T11 levels
along with mild compression of lower endplate of body of T9 and
upper endplate of body of T11 vertebrae. Findings suggest recent or
old compression fractures due to trauma or disc degeneration. Please
correlate with clinical symptoms, physical examination findings and
consider MRI if warranted.

Schmorl's node is seen in the upper endplate of body of T12
vertebra. Paraspinal soft tissues are unremarkable.

## 2024-01-05 IMAGING — CT CT L SPINE W/O CM
3 of 4 series · 13 of 33 positions shown, 16 images · non-contrast
Comparison: MR lumbar spine done on 04/28/2021

CLINICAL DATA: Trauma, back pain



[Series 5: coronal bone · coronal · 0.32mm/px · 3 of 61 slices shown]
[im 13/61  bone]
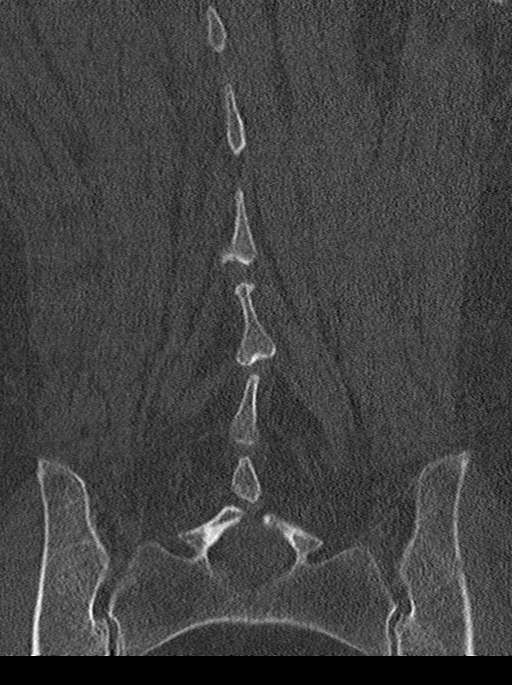
[im 25/61  bone]
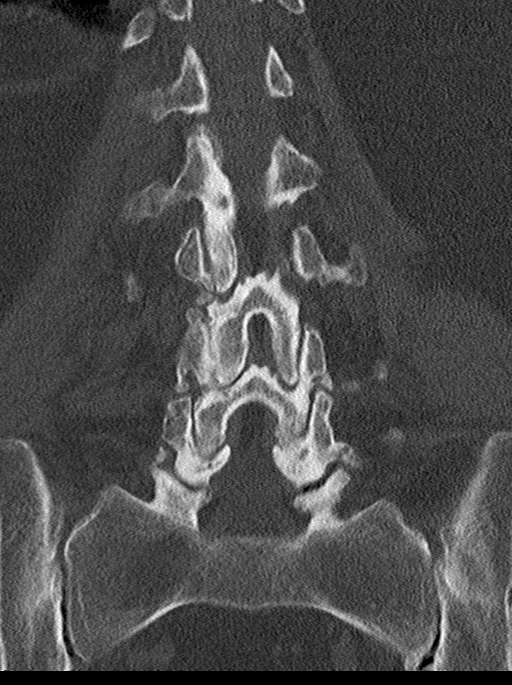
[im 37/61  bone]
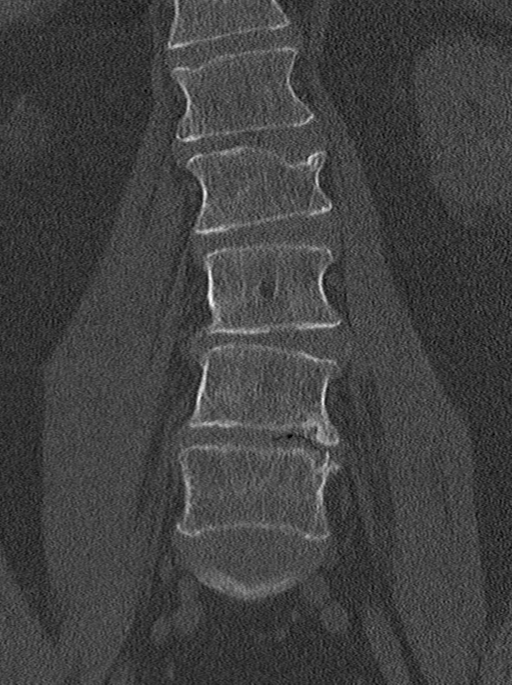

[Series 6: sagittal bone · sagittal · 0.32mm/px · 5 of 81 slices shown, 6 images]
[im 27/81  bone]
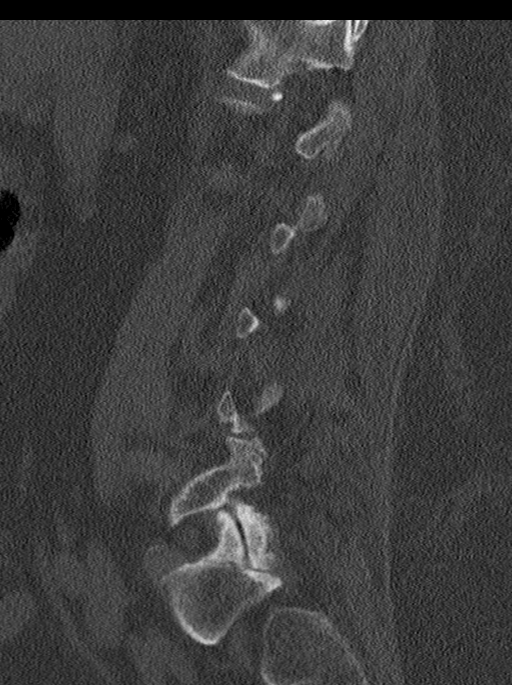
[im 34/81  bone]
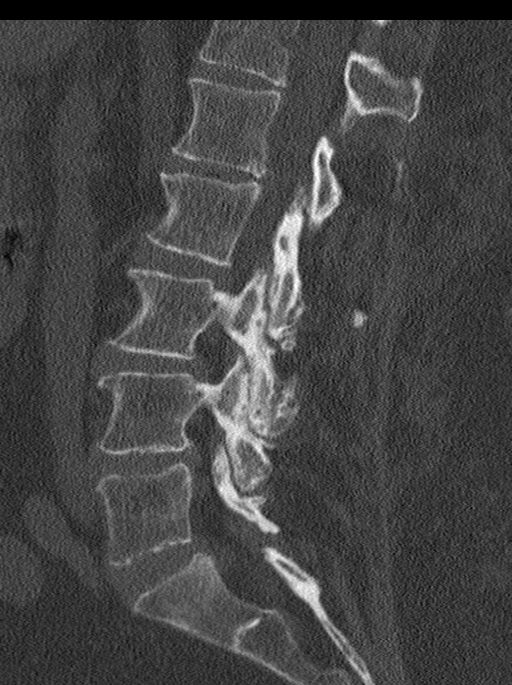
[im 41/81  soft-tissue]
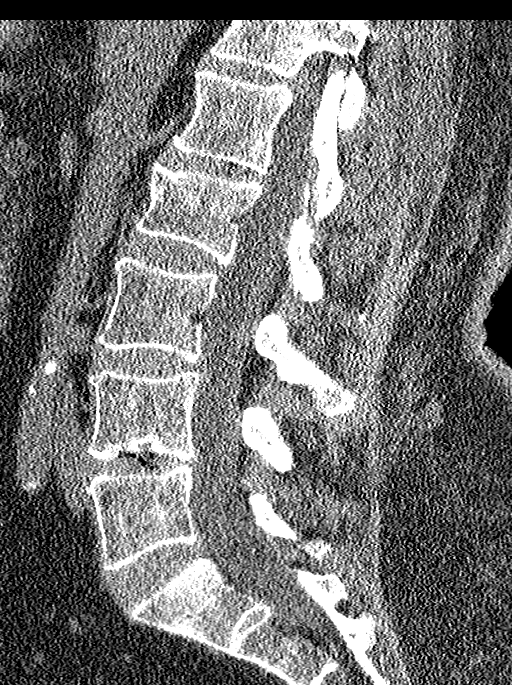
[im 41/81  bone]
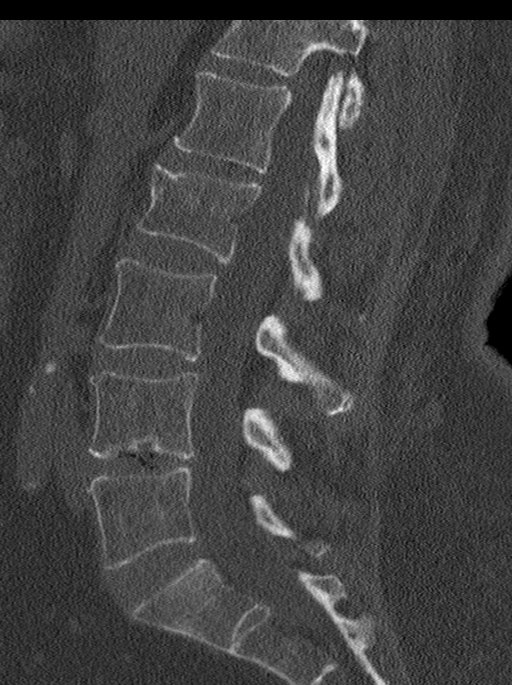
[im 47/81  bone]
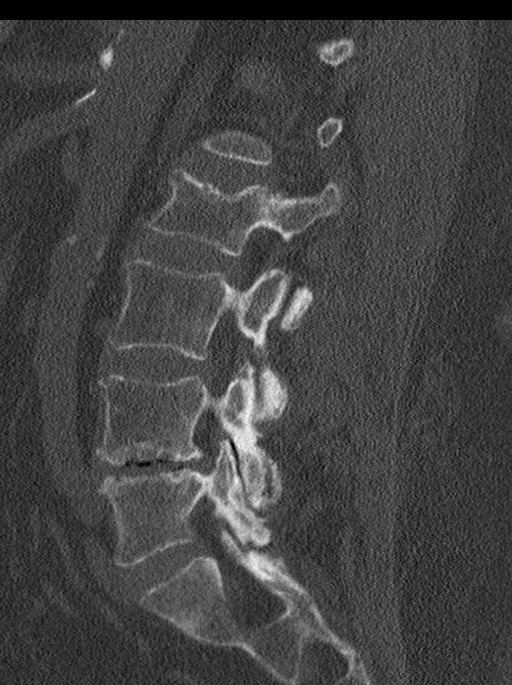
[im 54/81  bone]
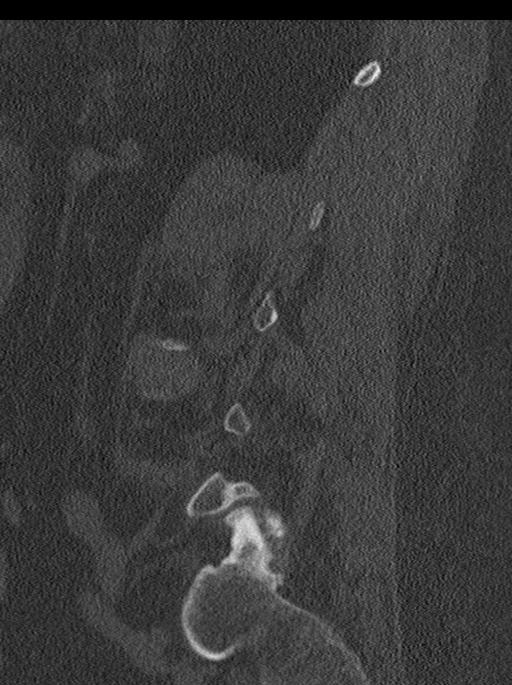

[Series 7: ax disc · axial · 0.21mm/px · z∈[+1140,+1297]mm · 5 of 96 slices shown, 7 images]
[im 16/96  soft-tissue]
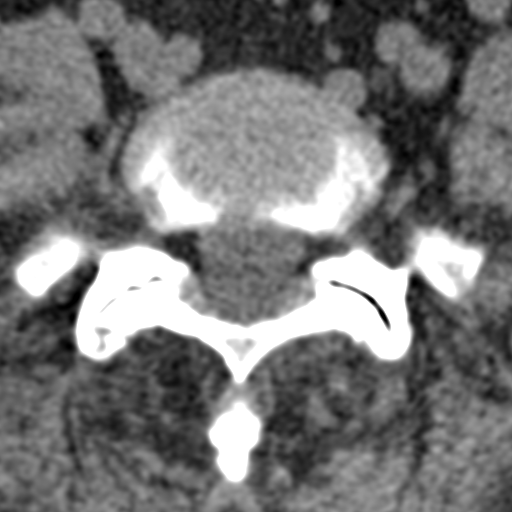
[im 16/96  bone]
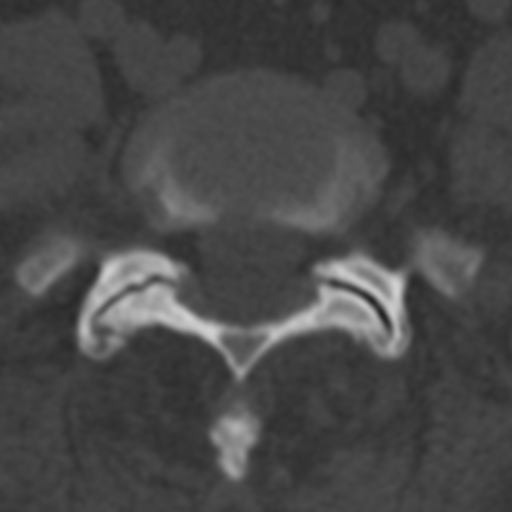
[im 32/96  bone]
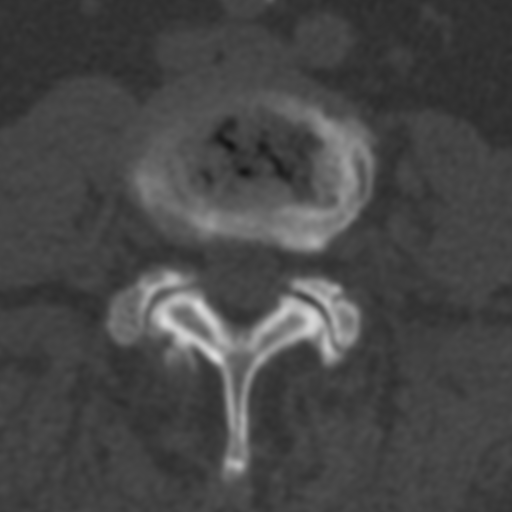
[im 48/96  bone]
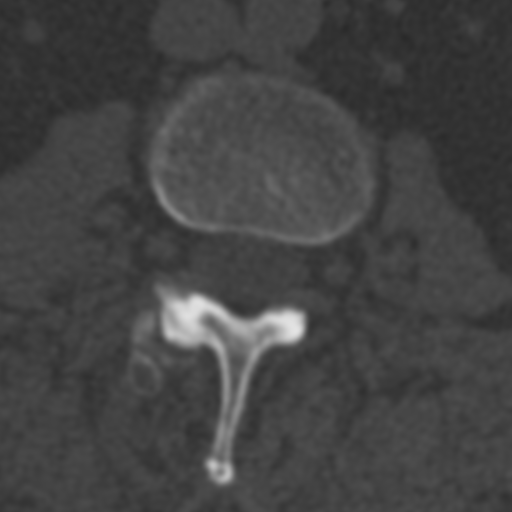
[im 64/96  bone]
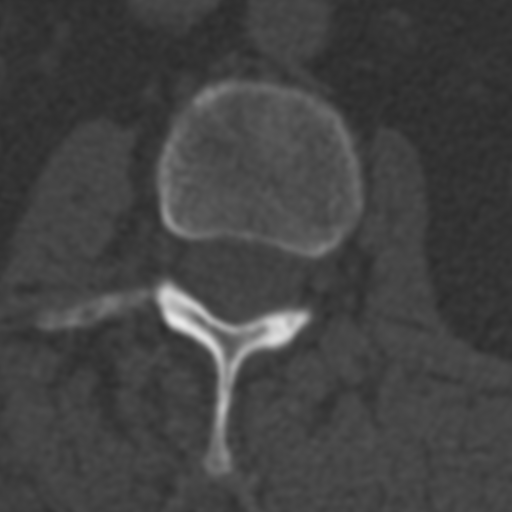
[im 80/96  soft-tissue]
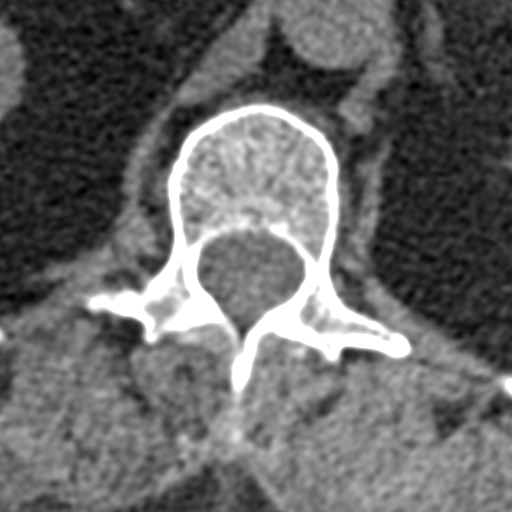
[im 80/96  bone]
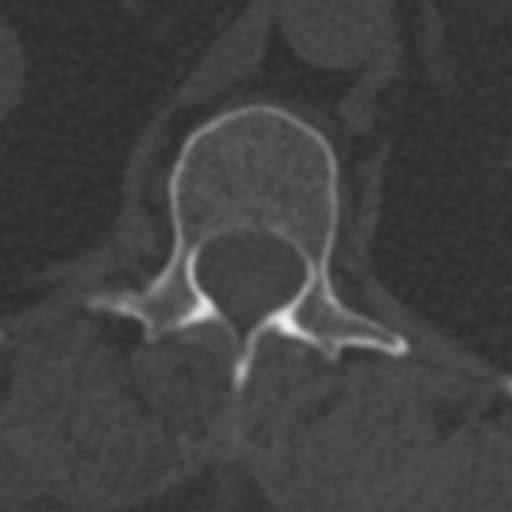

[13 of 33 positions shown; findings below may reference images not displayed]

FINDINGS: Segmentation: There are 5 non-rib-bearing vertebrae in the lumbar
region.

Alignment: Alignment of posterior margins of vertebral bodies is
unremarkable.

Vertebrae: There is mild 10-20% decrease in height of upper endplate
of body L2 vertebra which has not changed significantly. Schmorl's
node is seen in the lower endplate of body of L4 vertebra. There are
small pockets of air in the L4-L5 disc space, most likely due to
disc degeneration.

Paraspinal and other soft tissues: Unremarkable.

Disc levels: There is encroachment of neural foramina by bony spurs
and facet hypertrophy at L4-L5 level.
IMPRESSION: Decrease in height of upper endplate of body of L2 vertebra has not
changed since 04/28/2021 suggesting mild old compression fracture.
No recent fracture is seen in the lumbar spine.

Lumbar spondylosis with encroachment of neural foramina at L4-L5
level.

## 2024-01-05 NOTE — Telephone Encounter (Signed)
 Appt 8/13

## 2024-01-05 NOTE — Telephone Encounter (Signed)
 Copied from CRM #[id number]. Topic: General - Other >> Jan 05, 2024 12:00 PM Carla L wrote: Reason for CRM: Teon w/ [facility] calling to inform provider that patient is requesting start of care date for skilled nursing and physical therapy to be changed to tomorrow. Start of care originally for today.   Best call back number: [phone]

## 2024-01-05 NOTE — Telephone Encounter (Signed)
 NOTED

## 2024-01-06 ENCOUNTER — Ambulatory Visit: Payer: Self-pay

## 2024-01-06 DIAGNOSIS — L89321 Pressure ulcer of left buttock, stage 1: Secondary | ICD-10-CM | POA: Diagnosis not present

## 2024-01-06 DIAGNOSIS — Z9181 History of falling: Secondary | ICD-10-CM | POA: Diagnosis not present

## 2024-01-06 DIAGNOSIS — R296 Repeated falls: Secondary | ICD-10-CM | POA: Diagnosis not present

## 2024-01-06 DIAGNOSIS — Z79899 Other long term (current) drug therapy: Secondary | ICD-10-CM | POA: Diagnosis not present

## 2024-01-06 DIAGNOSIS — Z7984 Long term (current) use of oral hypoglycemic drugs: Secondary | ICD-10-CM | POA: Diagnosis not present

## 2024-01-06 DIAGNOSIS — Z7985 Long-term (current) use of injectable non-insulin antidiabetic drugs: Secondary | ICD-10-CM | POA: Diagnosis not present

## 2024-01-06 DIAGNOSIS — Z7901 Long term (current) use of anticoagulants: Secondary | ICD-10-CM | POA: Diagnosis not present

## 2024-01-06 DIAGNOSIS — Z794 Long term (current) use of insulin: Secondary | ICD-10-CM | POA: Diagnosis not present

## 2024-01-06 NOTE — Telephone Encounter (Signed)
 FYI Only or Action Required?: Action required by provider: medication refill request and update on patient condition. Refill of colchicine  Patient was last seen in primary care on 01/01/2024 by Dettinger, Fonda LABOR, MD.  Called Nurse Triage reporting Mouth Lesions.  Symptoms began several months ago.  Interventions attempted: Prescription medications: colchicine .  Symptoms are: gradually worsening.  Triage Disposition: Home Care  Patient/caregiver understands and will follow disposition?: Yes   Copied from CRM #[id number]. Topic: Clinical - Medication Question >> Jan 06, 2024  2:46 PM Yolanda T wrote: Reason for CRM: patient called stated she needs the medication colchicine  tablet 0.6 mg as he mouth is still all broken out. She put the request in on 8/4 and it was denied per discontinued on 12/25/23. Please f/u with patient to see about getting her medication refilled Reason for Disposition  Canker sore(s) suspected (e.g., 1 to 3 painful white shallow ulcers; no fever, no recent cancer treatment)  Answer Assessment - Initial Assessment Questions 1. LOCATION: Where is the mouth sore (ulcer) located?      Inside mouth and outside lips 2. NUMBER: How many sores are there? :     Too many to coung 3. SIZE: How large is the sore?  (e.g., size of an apple seed, watermelon seed, pencil eraser)     unsure 4. PAIN: Are they painful? If Yes, ask: How bad is it?  (Scale 0-10; or none, mild, moderate, severe)     States that they are very painful making it difficult to eat and drink 5. ONSET: When did you first notice the sore?      About two months ago 6. RECURRENT SYMPTOM: Have you had a mouth ulcer before? If Yes, ask: When was the last time? and What happened that time?   denies 7. CAUSE: What do you think is causing the mouth sore?     unsure 8. OTHER SYMPTOMS: Do you have any other symptoms? (e.g., fever, swollen lymph node)     denies 9. PREGNANCY: Is there any chance  you are pregnant? When was your last menstrual period?     N/a   States that mouth sores had been improving on colchicine , but they are worsening now  Protocols used: Mouth Ulcers-A-AH

## 2024-01-07 ENCOUNTER — Ambulatory Visit (INDEPENDENT_AMBULATORY_CARE_PROVIDER_SITE_OTHER): Admitting: Adult Health

## 2024-01-07 ENCOUNTER — Other Ambulatory Visit: Payer: Self-pay | Admitting: Family Medicine

## 2024-01-07 ENCOUNTER — Encounter: Payer: Self-pay | Admitting: Adult Health

## 2024-01-07 VITALS — BP 127/85 | HR 101 | Ht 62.0 in | Wt 183.0 lb

## 2024-01-07 DIAGNOSIS — I10 Essential (primary) hypertension: Secondary | ICD-10-CM

## 2024-01-07 DIAGNOSIS — L292 Pruritus vulvae: Secondary | ICD-10-CM

## 2024-01-07 DIAGNOSIS — M352 Behcet's disease: Secondary | ICD-10-CM

## 2024-01-07 DIAGNOSIS — N898 Other specified noninflammatory disorders of vagina: Secondary | ICD-10-CM

## 2024-01-07 MED ORDER — COLCHICINE 0.6 MG PO TABS: 0.6000 mg | ORAL_TABLET | Freq: Two times a day (BID) | ORAL | 1 refills | Status: DC

## 2024-01-07 NOTE — Telephone Encounter (Signed)
 Please let the patient know that I sent their prescription to their pharmacy. Thanks, WS

## 2024-01-07 NOTE — Telephone Encounter (Signed)
 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

## 2024-01-07 NOTE — Progress Notes (Signed)
  Subjective:     Patient ID: Amber Stephenson, female   DOB: [birth year], 78 y.o.   MRN: [id number]  HPI Amber Stephenson is a 78 year old white female, married, PM in complaining of vulva itching, feels swollen and vaginal itching, has just finished antibiotic for UTI and sugars not stable. She has had recent fall and has pain in ribs still.  She says she had rectal bleeding and is having colonoscopy the end of August. She finished radiation therapy 05/02/23 for endometrial cancer.  PCP is Dr Zollie Review of Systems +Vulva itching, feels swollen and vaginal itching,   Denies any vaginal bleeding  Reviewed past medical,surgical, social and family history. Reviewed medications and allergies.  Objective:   Physical Exam BP 127/85 (BP Location: Left Arm, Patient Position: Sitting, Cuff Size: Normal)   Pulse (!) 101   Ht 5' 2 (1.575 m)   Wt 183 lb (83 kg)   BMI 33.47 kg/m     Skin warm and dry.Pelvic: external genitalia is normal in appearance no lesions, or swelling, vagina:scant white discharge without odor,urethra has no lesions or masses noted, cervix:smooth, uterus: normal size, shape and contour, non tender, no masses felt, adnexa: no masses or tenderness noted. Bladder is non tender and no masses felt.Painted vulva and vagina with gentian violet.   Upstream - 01/07/24 1430       Pregnancy Intention Screening   Does the patient want to become pregnant in the next year? N/A    Does the patient's partner want to become pregnant in the next year? N/A    Would the patient like to discuss contraceptive options today? N/A      Contraception Wrap Up   Current Method Abstinence   PM   End Method Abstinence   PM   Contraception Counseling Provided No         Examination chaperoned by Clarita Salt LPN  Assessment:     1. Vulvar itching (Primary) Has more itching on vulva, felt swollen Painted vulva and vaginal with gentian violet   2. Vaginal itching Has itching   3. Essential  hypertension Take BP meds and follow up with PCP    Plan:     Follow up prn

## 2024-01-07 NOTE — Telephone Encounter (Signed)
 Pt aware rx sent in.

## 2024-01-08 ENCOUNTER — Encounter: Attending: Physical Medicine and Rehabilitation | Admitting: Physical Medicine and Rehabilitation

## 2024-01-08 ENCOUNTER — Encounter: Payer: Self-pay | Admitting: Physical Medicine and Rehabilitation

## 2024-01-08 VITALS — BP 133/89 | HR 82 | Ht 62.0 in | Wt 183.4 lb

## 2024-01-08 DIAGNOSIS — M5441 Lumbago with sciatica, right side: Secondary | ICD-10-CM | POA: Insufficient documentation

## 2024-01-08 DIAGNOSIS — G894 Chronic pain syndrome: Secondary | ICD-10-CM | POA: Diagnosis not present

## 2024-01-08 DIAGNOSIS — S2232XD Fracture of one rib, left side, subsequent encounter for fracture with routine healing: Secondary | ICD-10-CM | POA: Insufficient documentation

## 2024-01-08 DIAGNOSIS — G8929 Other chronic pain: Secondary | ICD-10-CM | POA: Insufficient documentation

## 2024-01-08 DIAGNOSIS — R531 Weakness: Secondary | ICD-10-CM | POA: Insufficient documentation

## 2024-01-08 MED ORDER — OXYCODONE HCL ER 20 MG PO T12A: 20.0000 mg | EXTENDED_RELEASE_TABLET | Freq: Two times a day (BID) | ORAL | 0 refills | Status: DC

## 2024-01-08 NOTE — Patient Instructions (Signed)
  Patient is a 78 yr old female with hx of  CAD, pulmonary HTN- and DM- with CKD- Stage III here for right low back pain with sciatica  As well as upper back/neck pain/myofascial pain. Pt is here for chronic pain f/u  with recent dx of uterine cancer- - undergoing treatment-    Would see Dermatology- for sores in mouth.    2.   Just started H/H PT yesterday- supposed to have PT-  to build her strength back up.   3.  O2 - if gets you get 88% or less WITH WALKING- even a prolonged distance- felt a lot better in past with O2 esp to sleep- but Insurance won't pay for it for obstructive sleep apnea.   4.  Unfortunately, I have no seen pain meds help rib fracture- mainly just lidocaine  patches, but her location is right under breast.   5. Has prescription for cream- diclofenac  gel up to 3-4x/day.  Can also rub into shoulders as well. And rub on L side as well.   6. Getting work up of rectal bleeding. Which I agree with.   7.  Take Methocarbamol - [phone] mg can take up to 4x/day- for muscle spasms- Cr and BUN WNL  8.  F/U in 2 months- With Fidela- and 4months with me-

## 2024-01-08 NOTE — Progress Notes (Signed)
 Subjective:    Patient ID: Amber Stephenson, female    DOB: 1946/01/26, 78 y.o.   MRN: [id number]  HPI   Patient is a 78 yr old female with hx of  CAD, pulmonary HTN- and DM- with CKD- Stage III here for right low back pain with sciatica  As well as upper back/neck pain/myofascial pain. Pt is here for chronic pain f/u  with recent dx of uterine cancer- - undergoing treatment-    Pt is here as f/u for chronic pain.    Fell 2 weeks ago- and 3 weeks ago today.  Just fell straight back- and hit head, back and  L shoulder/chest-  Both times fell straight back - also hit buttocks as well as head/back.  1 was night time 6pm and the other one was closer in midnight.  Had gotten out of bed to go to bathroom. Had no issues during day.   This med Xanax  was started after the falls.    Nothing is helping her sleep- so new Xanax  is helping her sleep.    BP wasn't low that she/husband was aware of.    Has started some new meds.  Taken on Ativan  and put on Xanax . 1mg - 1 mg at bedtime.  Gabapentin  100 mg at bedtime but that was after the fall.  Is on Sinemet  for restless leg syndrome.    Is more sore now than has been the whole week.  Mainly sore in back and shoulder and neck sore.  When tried to get off the floor, hurt L shoulder on door jam.    Oxycontin  had been doing better prior the the falls-  Was helping pain a lot.    Last 3 months- sores I mouth- not sure why- they healed a little with some medicine.  Off ABX that didn't help heal sores.  Colchicine  was started for mouth ulcers- hasn't helped at little- actually helped for 2-3 days, then flared up again.   Has a way to see Dermatology.  Dr Shona  Stopped taking Meclizine  because thought was making her loopy- so don't think it was in her system.   Only on Lasix - but hasn't been on lately, because was losing so much weight- restarted yesterday.  Not really taking hydrochlorothiazide .  Water  intake hasn't changed.   Not  using any assistive device, except husband.   Even when gets up to bathroom- husband will at least watch her get up- if not walk her to bathroom.   Cannot breathe- and cannot get O2.  Couldn't do test for O2- too painful.    Finally got rid of UTI. Saw Gyn yesterday and has f/u with Oncology next week.   Had bleeding form rectum the last 3 months- getting treatment- and trying to investigate- to get colonoscopy-    Taking Robaxin  ~1x/day- last took yesterday AM Pain Inventory Average Pain 7 Pain Right Now 8 My pain is constant and sharp  In the last 24 hours, has pain interfered with the following? General activity 8 Relation with others 8 Enjoyment of life 8 What TIME of day is your pain at its worst? morning  Sleep (in general) Poor  Pain is worse with: some activites Pain improves with: rest, heat/ice, and medication Relief from Meds: 7  Family History  Problem Relation Age of Onset   Diabetes Mother    Heart disease Mother    Alzheimer's disease Father    Heart disease Sister        CABG  Diabetes Sister    Alcohol  abuse Sister    Stroke Brother    Heart disease Brother    Mental illness Brother    Diabetes Brother    Breast cancer Neg Hx    Ovarian cancer Neg Hx    Colon cancer Neg Hx    Endometrial cancer Neg Hx    Social History   Socioeconomic History   Marital status: Married    Spouse name: Dwight   Number of children: 3   Years of education: 15   Highest education level: Associate degree: academic program  Occupational History   Occupation: Retired    Comment: Chief Financial Officer  Tobacco Use   Smoking status: Never   Smokeless tobacco: Never  Vaping Use   Vaping status: Never Used  Substance and Sexual Activity   Alcohol  use: Not Currently    Alcohol /week: 0.0 standard drinks of alcohol    Drug use: No   Sexual activity: Not Currently    Partners: Male    Birth control/protection: Post-menopausal  Other Topics Concern   Not on file  Social  History Narrative   Lives at home with husband.    They have 3 children who live away - [place] , [place] , and [place] .   She is from [place]  and most of her family lives there.   Her husbands's family live nearby   Right handed.   Social Drivers of Corporate investment banker Strain: Low Risk  (08/13/2023)   Overall Financial Resource Strain (CARDIA)    Difficulty of Paying Living Expenses: Not hard at all  Food Insecurity: No Food Insecurity (12/31/2023)   Hunger Vital Sign    Worried About Running Out of Food in the Last Year: Never true    Ran Out of Food in the Last Year: Never true  Transportation Needs: No Transportation Needs (12/31/2023)   PRAPARE - Administrator, Civil Service (Medical): No    Lack of Transportation (Non-Medical): No  Physical Activity: Insufficiently Active (08/13/2023)   Exercise Vital Sign    Days of Exercise per Week: 3 days    Minutes of Exercise per Session: 20 min  Stress: No Stress Concern Present (08/13/2023)   [employer] of Occupational Health - Occupational Stress Questionnaire    Feeling of Stress : Only a little  Social Connections: Moderately Integrated (10/07/2023)   Social Connection and Isolation Panel    Frequency of Communication with Friends and Family: More than three times a week    Frequency of Social Gatherings with Friends and Family: Once a week    Attends Religious Services: More than 4 times per year    Active Member of [employer] or Organizations: No    Attends Engineer, structural: Never    Marital Status: Married   Past Surgical History:  Procedure Laterality Date   BREAST REDUCTION SURGERY     COLONOSCOPY  2018   6 mm cecal tubular adenoma, sigmoid diverticulosis, random colon biopsies consistent with microscopic colitis   EYE SURGERY Right    cateracts   HAMMER TOE SURGERY     LEFT HEART CATH AND CORONARY ANGIOGRAPHY N/A 02/03/2018   Procedure: LEFT HEART CATH AND CORONARY ANGIOGRAPHY;   Surgeon: [name], Peter M, MD;  Location: [facility];  Service: Cardiovascular;  Laterality: N/A;   REVERSE SHOULDER ARTHROPLASTY Right 08/19/2019   Procedure: REVERSE SHOULDER ARTHROPLASTY;  Surgeon: Kay Kemps, MD;  Location: [facility];  Service: Orthopedics;  Laterality: Right;  interscalene block  SHOULDER SURGERY Right    I BROKE MY SHOUDLER   THIGH SURGERY     TO REMOVE A TUMOR    Past Surgical History:  Procedure Laterality Date   BREAST REDUCTION SURGERY     COLONOSCOPY  2018   6 mm cecal tubular adenoma, sigmoid diverticulosis, random colon biopsies consistent with microscopic colitis   EYE SURGERY Right    cateracts   HAMMER TOE SURGERY     LEFT HEART CATH AND CORONARY ANGIOGRAPHY N/A 02/03/2018   Procedure: LEFT HEART CATH AND CORONARY ANGIOGRAPHY;  Surgeon: [name], Peter M, MD;  Location: [facility];  Service: Cardiovascular;  Laterality: N/A;   REVERSE SHOULDER ARTHROPLASTY Right 08/19/2019   Procedure: REVERSE SHOULDER ARTHROPLASTY;  Surgeon: Kay Kemps, MD;  Location: [facility];  Service: Orthopedics;  Laterality: Right;  interscalene block   SHOULDER SURGERY Right    I BROKE MY SHOUDLER   THIGH SURGERY     TO REMOVE A TUMOR    Past Medical History:  Diagnosis Date   Allergic rhinitis 02/24/2019   Ambulatory dysfunction 04/23/2020   Atrial fibrillation with RVR (HCC) 05/19/2017   Back pain/sacroiliitis--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   Benign paroxysmal positional vertigo due to bilateral vestibular disorder 05/20/2019   Bipolar 1 disorder (HCC) 01/23/2015   with GAD, benzo dependence   CAD (coronary artery disease)    Nonobstructive; Managed by Dr. Charls   Cardiomegaly 01/12/2018   CHF (congestive heart failure) 06/08/2022   Chronic back pain 01/04/2015   Chronic constipation 04/25/2020   Chronic diastolic heart failure (HCC)  87/71/7983   Chronic pain syndrome 08/22/2019   back pain, sacroiliitis   Chronic post-traumatic stress disorder (PTSD) 12/06/2020   Diabetic neuropathy (HCC) 02/06/2016   Dyslipidemia 09/24/2020   Essential hypertension    Functional diarrhea 10/26/2020   Herpes genitalis in women 07/16/2015   History of adenomatous polyp of colon 05/21/2019   Overview:   03/31/17: Colonoscopy: nonadvanced adenoma, microscopic colitis, f/u 5 yrs, Murphy/GAP   History of radiation therapy    Endometrial- 02/18/23-03/31/23-Dr. Lynwood Kinard   Hypotension 09/01/2022   Insomnia 01/23/2015   Metabolic acidosis 01/12/2023   Migraine headache with aura 02/12/2016   Myofascial pain dysfunction syndrome 08/22/2019   Non-alcoholic fatty liver disease 01/12/2018   OSA (obstructive sleep apnea) 02/24/2019   10/09/2018 - HST  - AHI 40.6    Osteopenia 12/06/2020   Rx alendronate  35 mg.   Pinguecula 12/06/2020   Presbyopia 12/06/2020   Pulmonary hypertension    RLS (restless legs syndrome) 04/27/2015   RSV (respiratory syncytial virus infection) 06/10/2023   Tremor, essential 12/11/2021   Type II diabetes mellitus (HCC) 09/24/2020   BP 133/89   Pulse 82   Ht 5' 2 (1.575 m)   Wt 183 lb 6.4 oz (83.2 kg)   SpO2 (!) 89%   BMI 33.54 kg/m   Opioid Risk Score:   Fall Risk Score:  `1  Depression screen PHQ 2/9     01/08/2024    2:04 PM 01/01/2024    2:37 PM 12/25/2023    9:57 AM 12/18/2023    2:17 PM 12/16/2023    4:33 PM 12/16/2023    3:28 PM 10/29/2023    1:26 PM  Depression screen PHQ 2/9  Decreased Interest 0 0 0 2 3 0 0  Down, Depressed, Hopeless 0 0 0 2 0 0 0  PHQ - 2 Score 0 0 0 4 3 0 0  Altered sleeping   0 3 2    Tired, decreased energy   0 3 3    Change in appetite   0 3 3    Feeling bad or failure about yourself    0 3 3    Trouble concentrating   0 3 2    Moving slowly or fidgety/restless   0 3 2    Suicidal thoughts   0 0 0    PHQ-9 Score   0 22 18    Difficult doing work/chores   Not  difficult at all Extremely dIfficult Extremely dIfficult       Review of Systems  Musculoskeletal:  Positive for arthralgias, gait problem and myalgias.       Broken ribs  All other systems reviewed and are negative.      Objective:   Physical Exam  Awake, alert, appropriate, looks in pain; accompanied by husband, NAD Very SOB, even with sitting at rest on seat  Having severe spasms around gut and abdomen/back- and grabbing at his back/abdomen- can tell it's very painful- writhing due to pain.  L frontal rib is very TTP- 5th rib       Assessment & Plan:   Patient is a 77 yr old female with hx of  CAD, pulmonary HTN- and DM- with CKD- Stage III here for right low back pain with sciatica  As well as upper back/neck pain/myofascial pain. Pt is here for chronic pain f/u  with recent dx of uterine cancer- - undergoing treatment-    Would see Dermatology- for sores in mouth.    2.   Just started H/H PT yesterday- supposed to have PT-  to build her strength back up.   3.  O2 - if gets you get 88% or less WITH WALKING- even a prolonged distance- felt a lot better in past with O2 esp to sleep- but Insurance won't pay for it for obstructive sleep apnea.   4.  Unfortunately, I have no seen pain meds help rib fracture- mainly just lidocaine  patches, but her location is right under breast.   5. Has prescription for cream- diclofenac  gel up to 3-4x/day.  Can also rub into shoulders as well. And rub on L side as well.   6. Getting work up of rectal bleeding. Which I agree with.   7.  Take Methocarbamol - [id number] mg can take up to 4x/day- for muscle spasms- Cr and BUN WNL  8.  F/U in 2 months- With Fidela- and 4 months with me-    I spent a total of 36   minutes on total care today- >50% coordination of care- due to d/w pt as detailed above- went over in detail, pain, spasms; how to treat pain;

## 2024-01-12 ENCOUNTER — Ambulatory Visit (INDEPENDENT_AMBULATORY_CARE_PROVIDER_SITE_OTHER)

## 2024-01-12 ENCOUNTER — Emergency Department (HOSPITAL_COMMUNITY)

## 2024-01-12 ENCOUNTER — Other Ambulatory Visit: Payer: Self-pay

## 2024-01-12 ENCOUNTER — Other Ambulatory Visit: Payer: Self-pay | Admitting: *Deleted

## 2024-01-12 ENCOUNTER — Encounter: Payer: Self-pay | Admitting: Family Medicine

## 2024-01-12 ENCOUNTER — Encounter (HOSPITAL_COMMUNITY): Payer: Self-pay | Admitting: Emergency Medicine

## 2024-01-12 ENCOUNTER — Emergency Department (HOSPITAL_COMMUNITY)
Admission: EM | Admit: 2024-01-12 | Discharge: 2024-01-12 | Disposition: A | Discharge status: Home / Self Care | Attending: Emergency Medicine | Admitting: Emergency Medicine

## 2024-01-12 ENCOUNTER — Ambulatory Visit (INDEPENDENT_AMBULATORY_CARE_PROVIDER_SITE_OTHER): Admitting: Family Medicine

## 2024-01-12 ENCOUNTER — Ambulatory Visit: Payer: Self-pay

## 2024-01-12 VITALS — BP 118/73 | HR 72 | Temp 97.6°F

## 2024-01-12 DIAGNOSIS — S199XXA Unspecified injury of neck, initial encounter: Secondary | ICD-10-CM | POA: Diagnosis not present

## 2024-01-12 DIAGNOSIS — S0083XA Contusion of other part of head, initial encounter: Secondary | ICD-10-CM | POA: Diagnosis not present

## 2024-01-12 DIAGNOSIS — W01198A Fall on same level from slipping, tripping and stumbling with subsequent striking against other object, initial encounter: Secondary | ICD-10-CM | POA: Insufficient documentation

## 2024-01-12 DIAGNOSIS — Z7901 Long term (current) use of anticoagulants: Secondary | ICD-10-CM | POA: Insufficient documentation

## 2024-01-12 DIAGNOSIS — M25461 Effusion, right knee: Secondary | ICD-10-CM | POA: Diagnosis not present

## 2024-01-12 DIAGNOSIS — R519 Headache, unspecified: Secondary | ICD-10-CM | POA: Diagnosis not present

## 2024-01-12 DIAGNOSIS — I700 Atherosclerosis of aorta: Secondary | ICD-10-CM | POA: Insufficient documentation

## 2024-01-12 DIAGNOSIS — R609 Edema, unspecified: Secondary | ICD-10-CM | POA: Diagnosis not present

## 2024-01-12 DIAGNOSIS — M799 Soft tissue disorder, unspecified: Secondary | ICD-10-CM | POA: Diagnosis not present

## 2024-01-12 DIAGNOSIS — M533 Sacrococcygeal disorders, not elsewhere classified: Secondary | ICD-10-CM

## 2024-01-12 DIAGNOSIS — S0990XA Unspecified injury of head, initial encounter: Secondary | ICD-10-CM | POA: Diagnosis not present

## 2024-01-12 DIAGNOSIS — M546 Pain in thoracic spine: Secondary | ICD-10-CM | POA: Diagnosis not present

## 2024-01-12 DIAGNOSIS — R918 Other nonspecific abnormal finding of lung field: Secondary | ICD-10-CM | POA: Insufficient documentation

## 2024-01-12 DIAGNOSIS — M51369 Other intervertebral disc degeneration, lumbar region without mention of lumbar back pain or lower extremity pain: Secondary | ICD-10-CM | POA: Diagnosis not present

## 2024-01-12 DIAGNOSIS — S8001XA Contusion of right knee, initial encounter: Secondary | ICD-10-CM | POA: Diagnosis not present

## 2024-01-12 DIAGNOSIS — E871 Hypo-osmolality and hyponatremia: Secondary | ICD-10-CM | POA: Insufficient documentation

## 2024-01-12 DIAGNOSIS — Y9301 Activity, walking, marching and hiking: Secondary | ICD-10-CM | POA: Insufficient documentation

## 2024-01-12 DIAGNOSIS — S06320A Contusion and laceration of left cerebrum without loss of consciousness, initial encounter: Secondary | ICD-10-CM | POA: Diagnosis not present

## 2024-01-12 DIAGNOSIS — G8929 Other chronic pain: Secondary | ICD-10-CM

## 2024-01-12 DIAGNOSIS — R590 Localized enlarged lymph nodes: Secondary | ICD-10-CM | POA: Diagnosis not present

## 2024-01-12 DIAGNOSIS — M25561 Pain in right knee: Secondary | ICD-10-CM | POA: Diagnosis not present

## 2024-01-12 DIAGNOSIS — J984 Other disorders of lung: Secondary | ICD-10-CM | POA: Diagnosis not present

## 2024-01-12 DIAGNOSIS — M545 Low back pain, unspecified: Secondary | ICD-10-CM | POA: Insufficient documentation

## 2024-01-12 DIAGNOSIS — M1711 Unilateral primary osteoarthritis, right knee: Secondary | ICD-10-CM | POA: Diagnosis not present

## 2024-01-12 DIAGNOSIS — J9611 Chronic respiratory failure with hypoxia: Secondary | ICD-10-CM | POA: Diagnosis not present

## 2024-01-12 DIAGNOSIS — M7989 Other specified soft tissue disorders: Secondary | ICD-10-CM | POA: Diagnosis not present

## 2024-01-12 DIAGNOSIS — D649 Anemia, unspecified: Secondary | ICD-10-CM | POA: Insufficient documentation

## 2024-01-12 DIAGNOSIS — Z043 Encounter for examination and observation following other accident: Secondary | ICD-10-CM | POA: Diagnosis not present

## 2024-01-12 LAB — CBC WITH DIFFERENTIAL/PLATELET
Abs Immature Granulocytes: 0.03 K/uL (ref 0.00–0.07)
Basophils Absolute: 0 K/uL (ref 0.0–0.1)
Basophils Relative: 1 %
Eosinophils Absolute: 0.2 K/uL (ref 0.0–0.5)
Eosinophils Relative: 4 %
HCT: 28 % — ABNORMAL LOW (ref 36.0–46.0)
Hemoglobin: 8.9 g/dL — ABNORMAL LOW (ref 12.0–15.0)
Immature Granulocytes: 1 %
Lymphocytes Relative: 12 %
Lymphs Abs: 0.8 K/uL (ref 0.7–4.0)
MCH: 27.1 pg (ref 26.0–34.0)
MCHC: 31.8 g/dL (ref 30.0–36.0)
MCV: 85.4 fL (ref 80.0–100.0)
Monocytes Absolute: 0.7 K/uL (ref 0.1–1.0)
Monocytes Relative: 10 %
Neutro Abs: 4.8 K/uL (ref 1.7–7.7)
Neutrophils Relative %: 72 %
Platelets: 216 K/uL (ref 150–400)
RBC: 3.28 MIL/uL — ABNORMAL LOW (ref 3.87–5.11)
RDW: 17.2 % — ABNORMAL HIGH (ref 11.5–15.5)
WBC: 6.5 K/uL (ref 4.0–10.5)
nRBC: 0 % (ref 0.0–0.2)

## 2024-01-12 LAB — BASIC METABOLIC PANEL WITH GFR
Anion gap: 13 (ref 5–15)
BUN: 12 mg/dL (ref 8–23)
CO2: 22 mmol/L (ref 22–32)
Calcium: 9.1 mg/dL (ref 8.9–10.3)
Chloride: 96 mmol/L — ABNORMAL LOW (ref 98–111)
Creatinine, Ser: 0.67 mg/dL (ref 0.44–1.00)
GFR, Estimated: >60 mL/min (ref >60)
Glucose, Bld: 79 mg/dL (ref 70–99)
Potassium: 4.3 mmol/L (ref 3.5–5.1)
Sodium: 131 mmol/L — ABNORMAL LOW (ref 135–145)

## 2024-01-12 LAB — TYPE AND SCREEN
ABO/RH(D): O POS
Antibody Screen: NEGATIVE

## 2024-01-12 MED ORDER — DICLOFENAC SODIUM 75 MG PO TBEC: 75.0000 mg | DELAYED_RELEASE_TABLET | Freq: Two times a day (BID) | ORAL | 2 refills | Status: DC

## 2024-01-12 MED ORDER — HYDROMORPHONE HCL 1 MG/ML IJ SOLN
1.0000 mg | Freq: Once | INTRAMUSCULAR | Status: AC
  Administered 2024-01-12 (×2): 1 mg via INTRAVENOUS
  Filled 2024-01-12: qty 1

## 2024-01-12 MED ORDER — MORPHINE SULFATE (PF) 4 MG/ML IV SOLN
4.0000 mg | Freq: Once | INTRAVENOUS | Status: AC
  Administered 2024-01-12 (×2): 4 mg via INTRAVENOUS
  Filled 2024-01-12: qty 1

## 2024-01-12 NOTE — ED Provider Notes (Signed)
 [place] EMERGENCY DEPARTMENT AT [facility] Provider Note   CSN: [id number] Arrival date & time: 01/12/24  1330     Patient presents with: No chief complaint on file.   Amber Stephenson is a 78 y.o. female.  She has extensive past medical history including A-fib on Eliquis , HFpEF, diabetic neuropathy, diabetes, chronic respiratory failure with baseline use of 3-4 L of oxygen  via nasal cannula.  She states she had a home oxygen  concentrator about about a month ago it was taken back because she was told she would have to pay for it by her oxygen  company as she states she has been doing very well without it especially when she walks around.  Presents ER today with multiple injuries secondary to a fall at home.  She states she was walking and her foot got caught on the ground and she fell down striking her right knee on the ground left side of her head on the table and then ultimately fell to the ground and she has pain all over but has the most pain at the right knee and left side of her head where she has noticed a goose egg.  Denies loss of consciousness.  She denies numbness tingling or weakness.  Patient does note she is on oxycodone  chronically for chronic pain.   HPI     Prior to Admission medications   Medication Sig Start Date End Date Taking? Authorizing Provider  ALPRAZolam  (XANAX ) 1 MG tablet Take 1 mg by mouth. 12/21/23   [provider]  ANUCORT-HC  25 MG suppository Place 25 mg rectally daily. 09/28/23   [provider]  ARIPiprazole  (ABILIFY ) 2 MG tablet Take 2 mg by mouth at bedtime. 10/07/21   [provider]  atorvastatin  (LIPITOR) 80 MG tablet Take 1 tablet (80 mg total) by mouth daily. 02/05/23 01/08/24  Lucien Orren SAILOR, PA-C  carbidopa -levodopa  (SINEMET ) 10-100 MG tablet Take 1 tablet by mouth 4 (four) times daily. For restless leg syndrome 12/01/23   [name], MD  colchicine  0.6 MG tablet Take 1 tablet (0.6 mg total) by mouth 2  (two) times daily. For mouth sores 01/07/24   [name], MD  Continuous Glucose Sensor (DEXCOM G7 SENSOR) MISC CHANGE SENSOR EVERY 10 DAYS 09/28/23   Shamleffer, Donell Cardinal, MD  cyanocobalamin  (VITAMIN B12) 1000 MCG tablet Take 1 tablet (1,000 mcg total) by mouth daily. 09/04/22   Ghimire, Donalda HERO, MD  denosumab  (PROLIA ) 60 MG/ML SOSY injection Inject 60 mg into the skin every 6 (six) months. 08/14/20   [name], MD  diclofenac  (VOLTAREN ) 75 MG EC tablet Take 1 tablet (75 mg total) by mouth 2 (two) times daily. For muscle and  Joint pain 01/12/24   [name], MD  diclofenac  Sodium (VOLTAREN ) 1 % GEL Apply 2 g topically 3 (three) times daily as needed. 01/01/24   Dettinger, Fonda LABOR, MD  diphenoxylate -atropine  (LOMOTIL ) 2.5-0.025 MG tablet TAKE 2 TABLETS BY MOUTH FOUR TIMES DAILY AS NEEDED FOR DIARRHEA OR LOOSE STOOLS 11/24/23   [name], MD  ELIQUIS  5 MG TABS tablet TAKE 1 TABLET TWICE A DAY 11/02/23   Shlomo Wilbert SAUNDERS, MD  escitalopram  (LEXAPRO ) 10 MG tablet Take 10 mg by mouth daily.    [provider]  furosemide  (LASIX ) 40 MG tablet Take 40 mg by mouth daily. 09/18/23   [provider]  gabapentin  (NEURONTIN ) 100 MG capsule Take 1 capsule (100 mg total) by mouth 3 (three) times daily. 01/01/24   Dettinger, Fonda  A, MD  hydrochlorothiazide  (MICROZIDE ) 12.5 MG capsule Take 12.5 mg by mouth daily. 11/19/23   [provider]  hydrOXYzine  (VISTARIL ) 25 MG capsule Take 1 capsule (25 mg total) by mouth 3 (three) times daily. 12/01/23   [name], MD  insulin  glargine (LANTUS  SOLOSTAR) 100 UNIT/ML Solostar Pen Inject 30 Units into the skin 2 (two) times daily. 09/21/23   Shamleffer, Ibtehal Jaralla, MD  insulin  lispro (HUMALOG  KWIKPEN) 100 UNIT/ML KwikPen Max daily 45 units 05/22/23   Shamleffer, Ibtehal Jaralla, MD  Insulin  Pen Needle 29G X MISC 1 Device by Does not apply route daily in the afternoon. 05/22/23   Shamleffer, Ibtehal Jaralla, MD  losartan   (COZAAR ) 25 MG tablet Take 25 mg by mouth daily. 02/14/23   [provider]  MAGNESIUM  PO Take 1 tablet by mouth daily.    [provider]  meclizine  (ANTIVERT ) 25 MG tablet Take 1 tablet (25 mg total) by mouth 3 (three) times daily as needed for dizziness. 06/05/23   Dettinger, Fonda LABOR, MD  metFORMIN  (GLUCOPHAGE -XR) 500 MG 24 hr tablet Take 2 tablets (1,000 mg total) by mouth daily with breakfast. 05/22/23   Shamleffer, Donell Cardinal, MD  Methocarbamol  1000 MG TABS Take 1,000 mg by mouth every 6 (six) hours as needed. 08/31/23   Lovorn, Megan, MD  metoprolol  tartrate (LOPRESSOR ) 50 MG tablet TAKE 1 TABLET TWICE A DAY 12/09/23   Shlomo Wilbert SAUNDERS, MD  omeprazole  (PRILOSEC) 40 MG capsule Take 1 capsule (40 mg total) by mouth 2 (two) times daily before a meal. 10/23/23   Rudy Josette RAMAN, PA-C  ondansetron  (ZOFRAN ) 4 MG tablet Take 1 tablet (4 mg total) by mouth every 8 (eight) hours as needed for nausea or vomiting. For cancer related nausea 01/01/24   Dettinger, Fonda LABOR, MD  OXcarbazepine  (TRILEPTAL ) 150 MG tablet Take 1 tablet (150 mg total) by mouth 2 (two) times daily. 06/13/22   Sherrill Cable Latif, DO  oxyCODONE  (OXYCONTIN ) 20 mg 12 hr tablet Take 1 tablet (20 mg total) by mouth every 12 (twelve) hours. 10/29/23   Debby Fidela CROME, NP  oxyCODONE  (OXYCONTIN ) 20 mg 12 hr tablet Take 1 tablet (20 mg total) by mouth every 12 (twelve) hours. Due today for pain meds- chronic pain- G89.3 01/08/24   Lovorn, Megan, MD  oxyCODONE  (OXYCONTIN ) 20 mg 12 hr tablet Take 1 tablet (20 mg total) by mouth every 12 (twelve) hours. G89.3- chronic pain- fill 28 days after 8/25- Rx 01/08/24   Lovorn, Megan, MD  potassium chloride  (KLOR-CON ) 10 MEQ tablet Take 1 tablet (10 mEq total) by mouth daily. 02/05/23 01/08/24  Lucien Orren SAILOR, PA-C  rOPINIRole  (REQUIP ) 1 MG tablet Take 1 mg by mouth at bedtime.    [provider]  tirzepatide  (MOUNJARO ) 12.5 MG/0.5ML Pen Inject 12.5 mg into the skin once a week. 09/21/23    Shamleffer, Donell Cardinal, MD  Vitamin D , Cholecalciferol , 25 MCG (1000 UT) TABS Take 1,000 Units by mouth daily in the afternoon. 07/26/20   [provider]    Allergies: Iodinated contrast media, Iodine, Latex, Tape, and Tizanidine     Review of Systems  Updated Vital Signs There were no vitals taken for this visit.  Physical Exam Vitals and nursing note reviewed.  Constitutional:      General: She is not in acute distress.    Appearance: She is well-developed.  HENT:     Head:     Comments: Hematoma left temporal area    Mouth/Throat:  Mouth: Mucous membranes are moist.  Eyes:     Extraocular Movements: Extraocular movements intact.     Conjunctiva/sclera: Conjunctivae normal.     Pupils: Pupils are equal, round, and reactive to light.  Neck:     Trachea: Trachea normal.     Comments: Diffuse midline tenderness Cardiovascular:     Rate and Rhythm: Normal rate and regular rhythm.     Heart sounds: No murmur heard. Pulmonary:     Effort: Pulmonary effort is normal. No respiratory distress.     Breath sounds: Normal breath sounds.  Abdominal:     Palpations: Abdomen is soft.     Tenderness: There is no abdominal tenderness.  Musculoskeletal:        General: No swelling.     Right shoulder: Normal.     Left shoulder: Normal.     Right upper arm: Normal.     Left upper arm: Normal.     Right forearm: Normal.     Left forearm: Normal.     Cervical back: Neck supple.     Comments: Diffuse tenderness of midline T-spine and L-spine, midline sacrum.  Ecchymosis with swelling and tenderness to right knee.  Patient cannot bend knee.  Distal pulses in the right foot are intact.  No pain to foot ankle or lower leg.  Skin:    General: Skin is warm and dry.     Capillary Refill: Capillary refill takes less than 2 seconds.  Neurological:     General: No focal deficit present.     Mental Status: She is alert and oriented to person, place, and time.  Psychiatric:         Mood and Affect: Mood normal.     (all labs ordered are listed, but only abnormal results are displayed) Labs Reviewed - No data to display  EKG: None  Radiology: No results found.   Procedures   Medications Ordered in the ED - No data to display                                  Medical Decision Making This patient presents to the ED for concern of head injury and right knee pain after mechanical fall today, this involves an extensive number of treatment options, and is a complaint that carries with it a high risk of complications and morbidity.  The differential diagnosis includes intracranial hemorrhage, fracture, sprain, contusion, hemarthrosis, other   Co morbidities that complicate the patient evaluation :   A-fib on chronic anticoagulants   Additional history obtained:  Additional history obtained from EMR External records from outside source obtained and reviewed including prior notes and lab   Lab Tests:  I Ordered, and personally interpreted labs.  The pertinent results include: Hemoglobin is stable at 8.9, sodium slightly low at 131 this is her baseline   Imaging Studies ordered:  I ordered imaging studies including right knee x-ray which shows soft tissue swelling no fracture or dislocation, CT head no intracranial hemorrhage, CT C-spine no fracture or traumatic malalignment CT chest abdomen pelvis no acute intrathoracic or intra-abdominal findings I independently visualized and interpreted imaging within scope of identifying emergent findings  I agree with the radiologist interpretation   Cardiac Monitoring: / EKG:  The patient was maintained on a cardiac monitor.  I personally viewed and interpreted the cardiac monitored which showed an underlying rhythm of: Atrial fibrillation similar to previous   Consultations  Obtained:  I requested consultation with the case manager,  and discussed lab and imaging findings as well as pertinent plan - they  recommend: They are able to work on getting patient home oxygen  tomorrow unfortunately unable to get it tonight.   Problem List / ED Course / Critical interventions / Medication management   Reports mechanical fall at home today, after her shoe got caught on the floor and she fell the ground.  She is post to use a walker but was not using it at home today.  She struck the left side of her head on a coffee table, and right knee on the ground.  These are the areas of greatest pain but she also has some right anterior chest pain, residual left anterior chest pain from prior rib fracture from recent fall and diffuse neck and back pain.  Given patient's Eliquis  use will obtain CT head, C-spine chest abdomen pelvis due to diffuse pain.  Fortunately due to contrast allergy we are not able to give her IV contrast. Imaging is all reassuring, patient has of chronic findings including chronic compression deformities of the spine but no acute deformities.  No intracranial hemorrhage, no C-spine abnormalities.  Patient able to ambulate with Ace wrap on her knee.  Given her tolerance to opiates she was given Dilaudid  for pain and this did help her a lot.  Her hesitance by going home was that she has been without oxygen  at home for a month and states she really does need especially when walking, she does desaturate on ambulation to 88% here and tolerated this okay so think she is safe to go home but will have social work work on getting her home oxygen  tomorrow which they feel they are able to do unfortunately is too late tonight to get it.  Also asked them to facilitate bedside commode since he is having pain with walking due to the right knee swelling.  Patient advised on PCP follow-up and orthopedic follow-up.  She was given strict return precautions.  On exam she appears to be uncomfortable but is in no significant distress and is an oriented with no focal neurologic deficits.   I ordered medication including  dilaudid   for pain  Reevaluation of the patient after these medicines showed that the patient improved I have reviewed the patients home medicines and have made adjustments as needed   Social Determinants of Health:  Lives with her husband   Test / Admission - Considered:  Patient unable to ambulate, able to set up home oxygen  do not feel she needs admission, she is agreeable with this.    Amount and/or Complexity of Data Reviewed Labs: ordered. Radiology: ordered.  Risk Prescription drug management.        Final diagnoses:  None    ED Discharge Orders     None          Suellen Sherran DELENA DEVONNA 01/12/24 2106    Cleotilde Rogue, MD 01/14/24 1313

## 2024-01-12 NOTE — ED Notes (Signed)
 SATURATION QUALIFICATIONS: (This note is used to comply with regulatory documentation for home oxygen )  Patient Saturations on Room Air at Rest = 92%  Patient Saturations on Room Air while Ambulating = 88%  Patient Saturations on 4 Liters of oxygen  while Ambulating = 96%  Please briefly explain why patient needs home oxygen : Oxygen  Saturation levels decrease with exertion and while resting at night. Pt previously utilized CPAP at night, but no longer has this equipment.

## 2024-01-12 NOTE — ED Notes (Signed)
 Pt on 4 L via [place] at baseline

## 2024-01-12 NOTE — Telephone Encounter (Signed)
 FYI Only or Action Required?: FYI only for provider.  Patient was last seen in primary care on 01/01/2024 by Dettinger, Fonda LABOR, MD.  Called Nurse Triage reporting Knee Pain.  Symptoms began several days ago.  Interventions attempted: Nothing.  Symptoms are: unchanged.  Triage Disposition: See HCP Within 4 Hours (Or PCP Triage)  Patient/caregiver understands and will follow disposition?: Yes       Copied from CRM [id number]. Topic: Clinical - Red Word Triage >> Jan 12, 2024  7:33 AM Essie A wrote: Red Word that prompted transfer to Nurse Triage: Pain and bruised right knee, and can hardly walk since yesterday. Reason for Disposition  [1] SEVERE pain (e.g., excruciating, unable to walk) AND [2] not improved after 2 hours of pain medicine  Answer Assessment - Initial Assessment Questions 1. LOCATION and RADIATION: Where is the pain located?      Right knee; states had a fall 2 weeks ago but pain in knee started two days ago 2. QUALITY: What does the pain feel like?  (e.g., sharp, dull, aching, burning)     sharp 3. SEVERITY: How bad is the pain? What does it keep you from doing?   (Scale 1-10; or mild, moderate, severe)     Severe, can hardly walk 4. ONSET: When did the pain start? Does it come and go, or is it there all the time?     Two days ago 5. RECURRENT: Have you had this pain before? If Yes, ask: When, and what happened then?     no 6. SETTING: Has there been any recent work, exercise or other activity that involved that part of the body?      Fall 2 weeks ago 7. AGGRAVATING FACTORS: What makes the knee pain worse? (e.g., walking, climbing stairs, running)     walking 8. ASSOCIATED SYMPTOMS: Is there any swelling or redness of the knee?     Yes, red and discolored, swelling 9. OTHER SYMPTOMS: Do you have any other symptoms? (e.g., calf pain, chest pain, difficulty breathing, fever)     denies 10. PREGNANCY: Is there any chance you are  pregnant? When was your last menstrual period?       na  Protocols used: Knee Pain-A-AH

## 2024-01-12 NOTE — ED Notes (Signed)
 Pt also states she is on 20 mg of oxycodone  2 times/day and did take dose at 0400 this morning. Also states she has a walker but does not use it at home due to the layout of the house and being hard to maneuver around the corners with a walker at home but I use it when I go places

## 2024-01-12 NOTE — Progress Notes (Signed)
 Subjective:  Patient ID: Amber Stephenson, female    DOB: 1946/01/26  Age: 78 y.o. MRN: [id number]  CC: Knee Pain   HPI  Discussed the use of AI scribe software for clinical note transcription with the patient, who gave verbal consent to proceed.  History of Present Illness   Amber Stephenson is a 78 year old female who presents with right knee pain following a fall two weeks ago.  She experiences significant pain in her right knee, primarily located over the kneecap, with tenderness at the patella and discomfort at the tibial tuberosity. The pain worsens every hour and is severe enough to impede her ability to walk.  In addition to the knee pain, there is tenderness on the inside part of her calf, associated with the same fall. This area remains tender and is still healing from an injury sustained a year ago.  She reports pain in the pelvic region, specifically near the sacroiliac joints, persisting since the fall when she landed on her bottom and hit her head twice. Her head is no longer bothering her.  No additional falls since the initial incident two weeks ago.             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), Benign paroxysmal positional vertigo due to bilateral vestibular disorder (05/20/2019), Bipolar 1 disorder (HCC) (01/23/2015), CAD (coronary artery disease), Cardiomegaly (01/12/2018), CHF (congestive heart failure) (06/08/2022), Chronic back pain (01/04/2015), Chronic constipation (04/25/2020), Chronic diastolic heart failure (HCC) (87/71/7983),  Chronic pain syndrome (08/22/2019), Chronic post-traumatic stress disorder (PTSD) (12/06/2020), Diabetic neuropathy (HCC) (02/06/2016), Dyslipidemia (09/24/2020), Essential hypertension, Functional diarrhea (10/26/2020), Herpes genitalis in women (07/16/2015), History of adenomatous polyp of colon (05/21/2019), History of radiation therapy, Hypotension (09/01/2022), Insomnia (01/23/2015), Metabolic acidosis (01/12/2023), Migraine headache with aura (02/12/2016), Myofascial pain dysfunction syndrome (08/22/2019), Non-alcoholic fatty liver disease (91/86/7980), OSA (obstructive sleep apnea) (02/24/2019), Osteopenia (12/06/2020), Pinguecula (12/06/2020), Presbyopia (12/06/2020), Pulmonary hypertension, RLS (restless legs syndrome) (04/27/2015), RSV (respiratory syncytial virus infection) (06/10/2023), Tremor, essential (12/11/2021), and Type II diabetes mellitus (HCC) (09/24/2020).   She has a past surgical history that includes THIGH SURGERY; Shoulder surgery (Right); Breast reduction surgery; Eye surgery (Right); Hammer toe surgery; LEFT HEART CATH AND CORONARY ANGIOGRAPHY (N/A, 02/03/2018); Reverse shoulder arthroplasty (Right, 08/19/2019); and Colonoscopy (2018).   Her family history includes Alcohol  abuse in her sister; Alzheimer's disease in her father; Diabetes in her brother, mother, and sister; Heart disease in her brother, mother, and sister; Mental illness in her brother; Stroke in her brother.She reports that she has never smoked. She has never used smokeless tobacco. She reports that she does not currently use alcohol . She reports that she does not use drugs.    ROS Review of Systems  Constitutional: Negative.   HENT: Negative.    Eyes:  Negative for visual disturbance.  Respiratory:  Negative for shortness of breath.   Cardiovascular:  Negative for chest pain.  Gastrointestinal:  Negative for abdominal pain.  Musculoskeletal:  Negative for arthralgias.    Objective:  BP  118/73   Pulse  72   Temp 97.6 F (36.4 C)   SpO2 91%   BP Readings from Last 3 Encounters:  01/12/24 (!) 152/93  01/12/24 118/73  01/08/24 133/89    Wt Readings from Last 3 Encounters:  01/08/24 183 lb 6.4 oz (83.2 kg)  01/07/24 183 lb (83 kg)  01/01/24 173 lb (78.5 kg)     Physical Exam Constitutional:      General: She is not in acute distress.    Appearance: She is well-developed.  HENT:     Head: Normocephalic and atraumatic.  Eyes:     Conjunctiva/sclera: Conjunctivae normal.     Pupils: Pupils are equal, round, and reactive to light.  Neck:     Thyroid : No thyromegaly.  Cardiovascular:     Rate and Rhythm: Normal rate and regular rhythm.     Heart sounds: Normal heart sounds. No murmur heard. Pulmonary:     Effort: Pulmonary effort is normal. No respiratory distress.     Breath sounds: Normal breath sounds. No wheezing or rales.  Abdominal:     General: Bowel sounds are normal. There is no distension.     Palpations: Abdomen is soft.     Tenderness: There is no abdominal tenderness.  Musculoskeletal:        General: Tenderness (Tender to light touch at a small area of mild erythema at the distal right patella.  Moderate tenderness throughout the anterior joint line.  Tenderness also noted at the superior aspect of the SI joints bilaterally) present. Normal range of motion.     Cervical back: Normal range of motion and neck supple.  Lymphadenopathy:     Cervical: No cervical adenopathy.  Skin:    General: Skin is warm and dry.  Neurological:     Mental Status: She is alert and oriented to person, place, and time.  Psychiatric:        Behavior: Behavior normal.        Thought Content: Thought content normal.        Judgment: Judgment normal.      Assessment & Plan:  Chronic SI joint pain -     DG Si Joints; Future  Pain and swelling of right knee -     DG Knee 1-2 Views Right; Future  Other orders -     Diclofenac  Sodium; Take 1 tablet (75 mg total) by mouth 2  (two) times daily. For muscle and  Joint pain  Dispense: 60 tablet; Refill: 2    Assessment and Plan    Right knee pain after fall Acute right knee pain post-fall, localized over the patella with tenderness and mild redness, significantly affecting ambulation. - Order right knee x-ray to assess for potential injury.  Right tibial tuberosity and calf pain after fall and prior injury Pain in right tibial tuberosity and calf, worsened by recent fall, with persistent tenderness from prior injury and additional mid-shin pain related to leg positioning.  Bilateral sacroiliac joint pain after fall Bilateral sacroiliac joint pain with tenderness near SI joints, higher in the pelvic area. - Order x-ray of sacroiliac joints to evaluate for potential injury.          Follow-up: Return if symptoms worsen or fail to improve.  Butler Der, M.D.

## 2024-01-12 NOTE — Discharge Instructions (Addendum)
 Pleasure taking care of you today.  You are seen for evaluation after a trip and fall.  Fortunately there is no bleeding in your brain, no broken bones.  Since your oxygen  does drop down to 88% when you block we are trying to get your oxygen  set back up but home.  Social work is going to work on this to get it to [employer].  Continue taking your chronic pain medicine.  Limit putting weight on your right leg using your walker at all times to prevent further falls which are very dangerous since you are on blood thinners.  Our case manager has discussed getting home oxygen  with your home oxygen  company and they are working on this, hopefully they will be able to deliver it tomorrow.  Come back to the ER if you have any new or worsening symptoms.

## 2024-01-12 NOTE — ED Notes (Signed)
 ED Provider at bedside.

## 2024-01-12 NOTE — Progress Notes (Addendum)
 Received call stating that patient needs home oxygen  for discharge. Referral made to [facility]; they are processing the request now. KATHEE [facility] Inpatient Care Management Supervisor [name] [phone]

## 2024-01-12 NOTE — ED Notes (Signed)
 Ambulated Pt on Pulse Ox on Room Air in [place]. Pts O2 Sats maintained between 88%-90% on room air. Pt ambulated with a walker with stand-by assist. PT also had right knee wrapped with Ace Wrap for support.

## 2024-01-12 NOTE — ED Notes (Signed)
 Ambulated Pt on Pulse Ox on Room Air in [place]. Pts O2 Sats maintained between 88%-90% on room air. Pt ambulated with a walker with stand-by assist. PT also had right knee wrapped with Ace Wrap for support.

## 2024-01-12 NOTE — ED Triage Notes (Signed)
 Pt arrived via [facility] from home c/o a fall that occurred today after her doctors appt follow up for a different fall that occurred x 1 week ago. The fall that occurred today happened while she was walking and fell forward hitting her head on a table, denies LOC, is on a blood thinner. Hematoma to L side of temple, c/o R knee pain--where knee appears to be deformed and have an abrasion on it. Pt also c/o L rib and elbow pain but states that was present from her fall x 1 week again

## 2024-01-13 ENCOUNTER — Ambulatory Visit: Payer: Self-pay | Admitting: Family Medicine

## 2024-01-13 ENCOUNTER — Ambulatory Visit

## 2024-01-13 NOTE — Progress Notes (Signed)
 Radiation Oncology         [phone] ________________________________  Name: Amber Stephenson MRN: [id number]  Date: 01/14/2024  DOB: 25-Nov-1945  Follow-Up Visit Note  CC: [name], MD  [name], MD    ICD-10-CM   1. Endometrial cancer (HCC)  C54.1       Diagnosis: The encounter diagnosis was Endometrial cancer (HCC) [C54.1].   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 (patient is not a surgical candidate due to comorbidities)    Cancer Staging  Endometrial cancer [facility]) Staging form: Corpus Uteri - Carcinoma and Carcinosarcoma, AJCC 8th Edition - Clinical stage from 01/26/2023: FIGO Stage IB (cT1b, cN0, cM0) - Signed by Eldonna Mays, MD on 02/03/2023   Indication for treatment: Curative       Interval Since Last Radiation:  9 month 17 days    Site/Dose/Technique/Mode:  First Treatment Date: 2023-02-18 - Last Treatment Date: 2023-03-31    Site: Uterus Technique: IMRT Mode: Photon Dose Per Fraction: 1.8 Gy Prescribed Dose (Delivered / Prescribed): 45 Gy / 45 Gy Prescribed Fxs (Delivered / Prescribed): 25 / 25   Site: Uterus - boost treatment  Technique: IMRT Mode: Photon Dose Per Fraction: 2 Gy Prescribed Dose (Delivered / Prescribed): 10 Gy / 10 Gy Prescribed Fxs (Delivered / Prescribed): 5 / 5   Narrative:  The patient returns today for routine follow-up. She was last seen in office on 07-09-23 for a routine follow up. Patient continued to follow up with their specialists to manage their chronic conditions. She experienced a UTI shortly after her visit and was treated with several rounds of antibiotics.    In the interval since she was last seen, she underwent a bilateral mammogram on 08/05/23 showing no mammographic evidence of malignancy. Mrs. Boesen was seen in the ED on 09/03/23 for generalized abdominal tenderness and rectal bleeding. CT CAP showed diverticulosis without diverticulitis but no other acute  issues. She states that she has been experiencing rectal bleeding since completing radiation therapy for uterine cancer- about 6 months. She was then advised to follow-up with GI. Subsequently, she followed up with [facility] and underwent workup that was FOBT positive. She is scheduled for a colonoscopy on 01/29/24.      She presented for a follow up with Dr. Eldonna on 10/05/23 where she was noted NED on examination. Due to her recent bleeding and abdominal pain, she underwent a CT A/P no 10/23/23 showed diverticulosis of the sigmoid colon, with short segment mural thickening in the mid sigmoid colon. She was seen in the ED on multiple occasions due to her diverticulosis.   She was also seen in the ED on 7/27 and 7/28 due to a fall and chest pain. CT scan of her chest, head, cervical spine, lumbar spine. All of the scans were unremarkable.     Patient presented for a follow up with her [facility]BY on 01/07/24 complaining of vulva itching, feels swollen and vaginal itching.    Most recent ED encounter on 01/12/24 for another fall. She underwent a CT head with no intracranial hemorrhage, CT C-spine indicating no fracture or traumatic malalignment and CT chest abdomen pelvis showing no acute intrathoracic or intra-abdominal findings.   No other significant oncologic interval history since the patient was last seen.   The patient reports spontaneous rectal bleeding along with abdominal cramping and bloating. These symptoms are not new since her most recent CT AP on 01/12/2024. She is taking Imodium  as needed for  her diarrhea. She is currently being worked up with GI and is scheduled for colonoscopy at the end of the month.  No reports of vaginal bleeding.   Allergies:  is allergic to iodinated contrast media, iodine, latex, tape, and tizanidine .  Meds: Current Outpatient Medications  Medication Sig Dispense Refill   ALPRAZolam  (XANAX ) 1 MG tablet Take 1 mg by mouth at bedtime as needed for anxiety or sleep.      ANUCORT-HC  25 MG suppository Place 25 mg rectally daily.     ARIPiprazole  (ABILIFY ) 2 MG tablet Take 2 mg by mouth at bedtime.     atorvastatin  (LIPITOR) 80 MG tablet Take 1 tablet (80 mg total) by mouth daily. 90 tablet 3   carbidopa -levodopa  (SINEMET ) 10-100 MG tablet Take 1 tablet by mouth 4 (four) times daily. For restless leg syndrome 120 tablet 5   colchicine  0.6 MG tablet Take 1 tablet (0.6 mg total) by mouth 2 (two) times daily. For mouth sores 60 tablet 1   Continuous Glucose Sensor (DEXCOM G7 SENSOR) MISC CHANGE SENSOR EVERY 10 DAYS 9 each 3   cyanocobalamin  (VITAMIN B12) 1000 MCG tablet Take 1 tablet (1,000 mcg total) by mouth daily. 30 tablet 1   denosumab  (PROLIA ) 60 MG/ML SOSY injection Inject 60 mg into the skin every 6 (six) months. 1 mL 1   diclofenac  (VOLTAREN ) 75 MG EC tablet Take 1 tablet (75 mg total) by mouth 2 (two) times daily. For muscle and  Joint pain 60 tablet 2   diclofenac  Sodium (VOLTAREN ) 1 % GEL Apply 2 g topically 3 (three) times daily as needed. 20 g 1   ELIQUIS  5 MG TABS tablet TAKE 1 TABLET TWICE A DAY 180 tablet 3   escitalopram  (LEXAPRO ) 10 MG tablet Take 10 mg by mouth daily.     furosemide  (LASIX ) 40 MG tablet Take 40 mg by mouth daily.     gabapentin  (NEURONTIN ) 100 MG capsule Take 1 capsule (100 mg total) by mouth 3 (three) times daily. 90 capsule 3   hydrochlorothiazide  (MICROZIDE ) 12.5 MG capsule Take 12.5 mg by mouth daily.     hydrOXYzine  (VISTARIL ) 25 MG capsule Take 1 capsule (25 mg total) by mouth 3 (three) times daily. 30 capsule 5   insulin  glargine (LANTUS  SOLOSTAR) 100 UNIT/ML Solostar Pen Inject 30 Units into the skin 2 (two) times daily. 60 mL 4   insulin  lispro (HUMALOG  KWIKPEN) 100 UNIT/ML KwikPen Max daily 45 units 45 mL 6   Insulin  Pen Needle 29G X MISC 1 Device by Does not apply route daily in the afternoon. 400 each 3   losartan  (COZAAR ) 25 MG tablet Take 25 mg by mouth daily.     MAGNESIUM  PO Take 1 tablet by mouth daily.      meclizine  (ANTIVERT ) 25 MG tablet Take 1 tablet (25 mg total) by mouth 3 (three) times daily as needed for dizziness. 60 tablet 1   metFORMIN  (GLUCOPHAGE -XR) 500 MG 24 hr tablet Take 2 tablets (1,000 mg total) by mouth daily with breakfast. 180 tablet 3   Methocarbamol  1000 MG TABS Take 1,000 mg by mouth every 6 (six) hours as needed. 120 tablet 5   metoprolol  tartrate (LOPRESSOR ) 50 MG tablet TAKE 1 TABLET TWICE A DAY 180 tablet 3   omeprazole  (PRILOSEC) 40 MG capsule Take 1 capsule (40 mg total) by mouth 2 (two) times daily before a meal. 60 capsule 3   ondansetron  (ZOFRAN ) 4 MG tablet Take 1 tablet (4 mg total) by mouth every  8 (eight) hours as needed for nausea or vomiting. For cancer related nausea 30 tablet 1   OXcarbazepine  (TRILEPTAL ) 150 MG tablet Take 1 tablet (150 mg total) by mouth 2 (two) times daily. 60 tablet 0   oxyCODONE  (OXYCONTIN ) 20 mg 12 hr tablet Take 1 tablet (20 mg total) by mouth every 12 (twelve) hours. 60 tablet 0   oxyCODONE  (OXYCONTIN ) 20 mg 12 hr tablet Take 1 tablet (20 mg total) by mouth every 12 (twelve) hours. Due today for pain meds- chronic pain- G89.3 60 tablet 0   oxyCODONE  (OXYCONTIN ) 20 mg 12 hr tablet Take 1 tablet (20 mg total) by mouth every 12 (twelve) hours. G89.3- chronic pain- fill 28 days after 8/25- Rx 60 tablet 0   potassium chloride  (KLOR-CON ) 10 MEQ tablet Take 1 tablet (10 mEq total) by mouth daily. 90 tablet 3   rOPINIRole  (REQUIP ) 1 MG tablet Take 1 mg by mouth at bedtime.     tirzepatide  (MOUNJARO ) 12.5 MG/0.5ML Pen Inject 12.5 mg into the skin once a week. 6 mL 3   Vitamin D , Cholecalciferol , 25 MCG (1000 UT) TABS Take 1,000 Units by mouth in the morning.     diphenoxylate -atropine  (LOMOTIL ) 2.5-0.025 MG tablet TAKE 2 TABLETS BY MOUTH FOUR TIMES DAILY AS NEEDED FOR DIARRHEA OR LOOSE STOOLS 30 tablet 5   Current Facility-Administered Medications  Medication Dose Route Frequency Provider Last Rate Last Admin   denosumab  (PROLIA ) injection 60 mg   60 mg Subcutaneous Q6 months [name], MD       Facility-Administered Medications Ordered in Other Encounters  Medication Dose Route Frequency Provider Last Rate Last Admin   bupivacaine -epinephrine  (MARCAINE  W/ EPI) 0.5% -1:200000 injection    Anesthesia Intra-op Hollis, Kevin D, MD   12 mL at 01/21/19 0935    Physical Findings: The patient is in no acute distress. Patient is alert and oriented.  height is 5' 2 (1.575 m) (pended) and weight is 179 lb 8 oz (81.4 kg) (pended). Her temporal temperature is 97.1 F (36.2 C) (abnormal, pended). Her blood pressure is 154/74 (abnormal, pended) and her pulse is 63 (pended). Her respiration is 20. .  Lungs are clear to auscultation bilaterally. Heart has regular rate and rhythm. No palpable cervical, supraclavicular, or axillary adenopathy. Abdomen soft, non-tender, normal bowel sounds. Bruising around her left eye from recent fall.   Pelvic exam reveals normal appearing external female genitalia.  No gross  lesions are noted of the labial, perineal or perianal tissue.  A speculum exam is performed.  Given the patient's recent falls and her limited mobility had difficulty visualizing the cervical region.  No mucosal lesions in the vaginal vault.  On bimanual examination the cervix is smooth without nodularity and is flush with the upper vaginal region.  Given the patient's issues with diarrhea and rectal bleeding she preferred not to have a rectal exam today.    Lab Findings: Lab Results  Component Value Date   WBC 6.5 01/12/2024   HGB 8.9 (L) 01/12/2024   HCT 28.0 (L) 01/12/2024   MCV 85.4 01/12/2024   PLT 216 01/12/2024    Radiographic Findings: CT CHEST ABDOMEN PELVIS WO CONTRAST Result Date: 01/12/2024 CLINICAL DATA:  Fall 1 week ago EXAM: CT CHEST, ABDOMEN AND PELVIS WITHOUT CONTRAST TECHNIQUE: Multidetector CT imaging of the chest, abdomen and pelvis was performed following the standard protocol without IV contrast. RADIATION DOSE  REDUCTION: This exam was performed according to the departmental dose-optimization program which includes automated exposure control, adjustment  of the mA and/or kV according to patient size and/or use of iterative reconstruction technique. COMPARISON:  CT 12/27/2023, 12/18/2023, 10/23/2023, 01/13/2023 FINDINGS: CT CHEST FINDINGS Cardiovascular: Limited evaluation without intravenous contrast. Mild aortic atherosclerosis. No aneurysm. Dense mitral calcification. Cardiomegaly. No pericardial effusion Mediastinum/Nodes: Patent trachea. No thyroid  mass. Mild mediastinal lymph nodes. Prevascular node measures 13 mm. Right paratracheal nodes measure up to 10 mm. Esophagus within normal limits. Lungs/Pleura: Underlying mild chronic subpleural reticulation and subtle ground-glass disease, consistent with chronic change. More acute multifocal heterogeneous ground-glass densities throughout the bilateral lungs, suspicious for superimposed pneumonia. Scattered small pulmonary nodules measuring up to 5 mm, stable, no specific imaging follow-up is recommended. Musculoskeletal: Sternum appears intact. Right shoulder replacement. No acute rib fracture. See separately dictated spine CT CT ABDOMEN PELVIS FINDINGS Hepatobiliary: No focal liver abnormality is seen. No gallstones, gallbladder wall thickening, or biliary dilatation. Pancreas: Unremarkable. No pancreatic ductal dilatation or surrounding inflammatory changes. Spleen: Normal in size without focal abnormality. Adrenals/Urinary Tract: Adrenal glands are stable in appearance with 1 cm left adrenal nodule, likely an adenoma, no specific imaging follow-up is recommended. Kidneys show no hydronephrosis. The bladder is unremarkable Stomach/Bowel: The stomach is nonenlarged. There is no dilated small bowel. Negative appendix. Large stool burden. Vascular/Lymphatic: Aortic atherosclerosis. No enlarged abdominal or pelvic lymph nodes. Reproductive: Uterus and bilateral adnexa are  unremarkable. Other: Negative for pelvic effusion or free air. Infiltration of the subcutaneous soft tissues of the abdominal pannus. Fat containing right groin hernia. Musculoskeletal: See separately dictated spine CT. No evidence for a pelvic fracture. IMPRESSION: 1. No CT evidence for acute traumatic intrathoracic, intra-abdominal, or intrapelvic abnormality allowing for absence of intravenous contrast. 2. Underlying chronic lung disease with superimposed multifocal heterogeneous ground-glass densities throughout the bilateral lungs, suspicious for multifocal pneumonia. 3. Mild mediastinal adenopathy, likely reactive. 4. Infiltration of the subcutaneous soft tissues of the abdominal pannus, this could be due to nonspecific edema or cellulitis. Correlate with direct inspection. 5. Aortic atherosclerosis. Aortic Atherosclerosis (ICD10-I70.0). Electronically Signed   By: Luke Bun M.D.   On: 01/12/2024 16:01   CT L-SPINE NO CHARGE Result Date: 01/12/2024 CLINICAL DATA:  Fall. EXAM: CT THORACIC AND LUMBAR SPINE WITHOUT CONTRAST TECHNIQUE: Multiplanar CT images of the thoracic and lumbar spine were reconstructed from contemporary CT of the Chest, Abdomen, and Pelvis. RADIATION DOSE REDUCTION: This exam was performed according to the departmental dose-optimization program which includes automated exposure control, adjustment of the mA and/or kV according to patient size and/or use of iterative reconstruction technique. CONTRAST:  None. COMPARISON:  CT thoracic and lumbar spine 12/27/2023 FINDINGS: CT THORACIC SPINE FINDINGS Alignment: Minimal scoliosis.  No significant listhesis. Vertebrae: Unchanged mild chronic T1, T9, and T11 compression fractures and T12 superior endplate Schmorl's node. No acute fracture or suspicious lesion. Paraspinal and other soft tissues: No acute abnormality in the paraspinal soft tissues. Remainder of the chest reported separately. Disc levels: Mild disc and facet degeneration  without evidence of high-grade stenosis. CT LUMBAR SPINE FINDINGS Segmentation: 5 lumbar type vertebrae. Alignment: Mild lumbar levoscoliosis. Unchanged trace retrolisthesis of L2 on L3. Vertebrae: No acute fracture or suspicious lesion. Unchanged mild chronic L2 superior endplate compression fracture. Degenerative changes and chronic Schmorl's node involving the L4 inferior endplate. Paraspinal and other soft tissues: No acute abnormality in the paraspinal soft tissues. Remainder of the abdomen and pelvis reported separately. Disc levels: Similar appearance of multilevel disc degeneration and facet arthrosis compared to the recent prior CT, including severe facet arthrosis on the right at  L2-3 and L3-4 and bilaterally at L4-5 and L5-S1. No evidence of high-grade stenosis. IMPRESSION: 1. No acute fracture in the thoracic or lumbar spine. 2. Unchanged mild chronic thoracic and lumbar compression fractures. Electronically Signed   By: Dasie Hamburg M.D.   On: 01/12/2024 16:00   CT T-SPINE NO CHARGE Result Date: 01/12/2024 CLINICAL DATA:  Fall. EXAM: CT THORACIC AND LUMBAR SPINE WITHOUT CONTRAST TECHNIQUE: Multiplanar CT images of the thoracic and lumbar spine were reconstructed from contemporary CT of the Chest, Abdomen, and Pelvis. RADIATION DOSE REDUCTION: This exam was performed according to the departmental dose-optimization program which includes automated exposure control, adjustment of the mA and/or kV according to patient size and/or use of iterative reconstruction technique. CONTRAST:  None. COMPARISON:  CT thoracic and lumbar spine 12/27/2023 FINDINGS: CT THORACIC SPINE FINDINGS Alignment: Minimal scoliosis.  No significant listhesis. Vertebrae: Unchanged mild chronic T1, T9, and T11 compression fractures and T12 superior endplate Schmorl's node. No acute fracture or suspicious lesion. Paraspinal and other soft tissues: No acute abnormality in the paraspinal soft tissues. Remainder of the chest reported  separately. Disc levels: Mild disc and facet degeneration without evidence of high-grade stenosis. CT LUMBAR SPINE FINDINGS Segmentation: 5 lumbar type vertebrae. Alignment: Mild lumbar levoscoliosis. Unchanged trace retrolisthesis of L2 on L3. Vertebrae: No acute fracture or suspicious lesion. Unchanged mild chronic L2 superior endplate compression fracture. Degenerative changes and chronic Schmorl's node involving the L4 inferior endplate. Paraspinal and other soft tissues: No acute abnormality in the paraspinal soft tissues. Remainder of the abdomen and pelvis reported separately. Disc levels: Similar appearance of multilevel disc degeneration and facet arthrosis compared to the recent prior CT, including severe facet arthrosis on the right at L2-3 and L3-4 and bilaterally at L4-5 and L5-S1. No evidence of high-grade stenosis. IMPRESSION: 1. No acute fracture in the thoracic or lumbar spine. 2. Unchanged mild chronic thoracic and lumbar compression fractures. Electronically Signed   By: Dasie Hamburg M.D.   On: 01/12/2024 16:00   CT Head Wo Contrast Result Date: 01/12/2024 CLINICAL DATA:  Head trauma, minor (Age >= 65y); Neck trauma (Age >= 65y). Fall today, hitting head on a table with hematoma. On blood thinner. Additional fall 1 week ago. EXAM: CT HEAD WITHOUT CONTRAST CT CERVICAL SPINE WITHOUT CONTRAST TECHNIQUE: Multidetector CT imaging of the head and cervical spine was performed following the standard protocol without intravenous contrast. Multiplanar CT image reconstructions of the cervical spine were also generated. RADIATION DOSE REDUCTION: This exam was performed according to the departmental dose-optimization program which includes automated exposure control, adjustment of the mA and/or kV according to patient size and/or use of iterative reconstruction technique. COMPARISON:  CT head and cervical spine 12/27/2023 FINDINGS: CT HEAD FINDINGS Brain: There is no evidence of an acute infarct,  intracranial hemorrhage, mass, midline shift, or extra-axial fluid collection. There is mild generalized cerebral atrophy. Patchy hypodensities in the cerebral white matter are similar to the prior CT and are nonspecific but compatible with mild-to-moderate chronic small vessel ischemic disease. Vascular: Calcified atherosclerosis at the skull base. No hyperdense vessel. Skull: No acute fracture or suspicious lesion. Sinuses/Orbits: Visualized paranasal sinuses and mastoid air cells are clear. Bilateral cataract extraction. Other: Left temporal scalp hematoma. CT CERVICAL SPINE FINDINGS Alignment: Mild reversal of the normal cervical lordosis. Unchanged trace anterolisthesis of C4 on C5. Skull base and vertebrae: No acute fracture or suspicious lesion. Soft tissues and spinal canal: No prevertebral fluid or swelling. No visible canal hematoma. Disc levels: Moderate cervical spondylosis  and facet arthrosis. No evidence of high-grade spinal canal stenosis. Moderate left neural foraminal stenosis at C6-7 due to asymmetric uncovertebral spurring. Upper chest: More fully evaluated on the contemporaneous CT of the chest, abdomen, and pelvis. Other: None. IMPRESSION: 1. No evidence of acute intracranial abnormality. 2. Left temporal scalp hematoma. 3. No acute cervical spine fracture or traumatic malalignment. Electronically Signed   By: Dasie Hamburg M.D.   On: 01/12/2024 15:51   CT Cervical Spine Wo Contrast Result Date: 01/12/2024 CLINICAL DATA:  Head trauma, minor (Age >= 65y); Neck trauma (Age >= 65y). Fall today, hitting head on a table with hematoma. On blood thinner. Additional fall 1 week ago. EXAM: CT HEAD WITHOUT CONTRAST CT CERVICAL SPINE WITHOUT CONTRAST TECHNIQUE: Multidetector CT imaging of the head and cervical spine was performed following the standard protocol without intravenous contrast. Multiplanar CT image reconstructions of the cervical spine were also generated. RADIATION DOSE REDUCTION: This exam  was performed according to the departmental dose-optimization program which includes automated exposure control, adjustment of the mA and/or kV according to patient size and/or use of iterative reconstruction technique. COMPARISON:  CT head and cervical spine 12/27/2023 FINDINGS: CT HEAD FINDINGS Brain: There is no evidence of an acute infarct, intracranial hemorrhage, mass, midline shift, or extra-axial fluid collection. There is mild generalized cerebral atrophy. Patchy hypodensities in the cerebral white matter are similar to the prior CT and are nonspecific but compatible with mild-to-moderate chronic small vessel ischemic disease. Vascular: Calcified atherosclerosis at the skull base. No hyperdense vessel. Skull: No acute fracture or suspicious lesion. Sinuses/Orbits: Visualized paranasal sinuses and mastoid air cells are clear. Bilateral cataract extraction. Other: Left temporal scalp hematoma. CT CERVICAL SPINE FINDINGS Alignment: Mild reversal of the normal cervical lordosis. Unchanged trace anterolisthesis of C4 on C5. Skull base and vertebrae: No acute fracture or suspicious lesion. Soft tissues and spinal canal: No prevertebral fluid or swelling. No visible canal hematoma. Disc levels: Moderate cervical spondylosis and facet arthrosis. No evidence of high-grade spinal canal stenosis. Moderate left neural foraminal stenosis at C6-7 due to asymmetric uncovertebral spurring. Upper chest: More fully evaluated on the contemporaneous CT of the chest, abdomen, and pelvis. Other: None. IMPRESSION: 1. No evidence of acute intracranial abnormality. 2. Left temporal scalp hematoma. 3. No acute cervical spine fracture or traumatic malalignment. Electronically Signed   By: Dasie Hamburg M.D.   On: 01/12/2024 15:51   DG Si Joints Result Date: 01/12/2024 CLINICAL DATA:  Chronic sacroiliac pain. EXAM: BILATERAL SACROILIAC JOINTS - 3+ VIEW COMPARISON:  None Available. FINDINGS: The sacroiliac joint spaces are  maintained. Minor degenerative spurring the sacroiliac joints. No erosions or bony destructive change. Sacral ala are maintained. No other bone abnormalities are seen. IMPRESSION: Minor degenerative spurring of the sacroiliac joints. Electronically Signed   By: Andrea Gasman M.D.   On: 01/12/2024 14:58   DG Knee 1-2 Views Right Result Date: 01/12/2024 CLINICAL DATA:  Pain and swelling of right knee. EXAM: RIGHT KNEE - 1-2 VIEW COMPARISON:  None Available. FINDINGS: Patient could not tolerate a lateral view. AP only view of the knee submitted. Mild medial tibiofemoral joint space narrowing. Mild peripheral spurring in the tibiofemoral compartments. No evidence of fracture or focal bone abnormality. IMPRESSION: Incomplete exam.  Mild osteoarthritis demonstrated on AP view. Electronically Signed   By: Andrea Gasman M.D.   On: 01/12/2024 14:56   DG Knee Complete 4 Views Right Result Date: 01/12/2024 CLINICAL DATA:  Pain after injury. Fall today. Most of fall week ago.  EXAM: RIGHT KNEE - COMPLETE 4+ VIEW COMPARISON:  Frontal view earlier today. FINDINGS: No acute fracture or dislocation. Mild medial tibiofemoral joint space narrowing. Mild tricompartmental peripheral spurring. Large prepatellar soft tissue thickening. Suspect small joint effusion, not well assessed due to positioning on the lateral view. IMPRESSION: 1. Prominent prepatellar soft tissue thickening. No acute fracture or dislocation. 2. Mild tricompartmental osteoarthritis. Electronically Signed   By: Andrea Gasman M.D.   On: 01/12/2024 14:55   DG Chest 2 View Result Date: 12/30/2023 EXAM: 2 VIEW(S) XRAY OF THE CHEST 12/30/2023 07:54:00 AM COMPARISON: 12/28/2023 CLINICAL HISTORY: L 5th rib Fx, concern for developing PNA. Per triage; Pt reports L sided rib pain from previous fall on 7/18. Has been seen 4 times since. Was given RX of narcotics but reports pain is not getting better. Transfers well. AxO VSS FINDINGS: LUNGS AND PLEURA:  Retrocardiac atelectasis or infiltrates. No pneumothorax. No pleural effusion. Pulmonary vascular congestion. HEART AND MEDIASTINUM: Stable enlargement of the cardiomediastinal silhouette. Mitral annular calcification. BONES AND SOFT TISSUES: Known left fifth rib fracture is not well visualized. IMPRESSION: 1. Retrocardiac atelectasis or infiltrates. 2. Stable enlargement of the cardiomediastinal silhouette with mitral annular calcification. 3. Pulmonary vascular congestion. Electronically signed by: Norman Gatlin MD 12/30/2023 08:16 AM EDT RP Workstation: [id number]   DG Chest Port 1 View Result Date: 12/28/2023 CLINICAL DATA:  [name] yesterday, left-sided chest wall pain EXAM: PORTABLE CHEST 1 VIEW COMPARISON:  12/27/2023 FINDINGS: Single frontal view of the chest demonstrates stable enlargement the cardiac silhouette and calcification of the mitral annulus. No acute airspace disease, effusion, or pneumothorax. Right shoulder arthroplasty again noted. There are no acute displaced fractures. Comparison with chest CT performed yesterday reveals a subtle minimally displaced left anterior fifth rib fracture in retrospect, reference image 85/9. IMPRESSION: 1. Subtle minimally displaced left anterior fifth rib fracture is seen on the comparison CT chest from 12/27/2023 in retrospect. This is not visible on today's x-ray. 2. Otherwise no acute intrathoracic process. 3. Stable enlarged cardiac silhouette and mitral annular calcification. Electronically Signed   By: Ozell Daring M.D.   On: 12/28/2023 14:03   CT Chest Wo Contrast Result Date: 12/27/2023 CLINICAL DATA:  Blunt trauma to the chest. EXAM: CT CHEST WITHOUT CONTRAST TECHNIQUE: Multidetector CT imaging of the chest was performed following the standard protocol without IV contrast. RADIATION DOSE REDUCTION: This exam was performed according to the departmental dose-optimization program which includes automated exposure control, adjustment of the mA and/or kV  according to patient size and/or use of iterative reconstruction technique. COMPARISON:  Chest CT dated 06/26/2023. FINDINGS: Evaluation of this exam is limited in the absence of intravenous contrast. Cardiovascular: There is mild cardiomegaly. No pericardial effusion. There is calcification of the mitral annulus. Mild atherosclerotic calcification of the thoracic aorta. No aneurysm or dilatation. The central pulmonary arteries are grossly unremarkable. Mediastinum/Nodes: No hilar or mediastinal adenopathy. The esophagus is grossly unremarkable. No mediastinal fluid collection. Lungs/Pleura: There is chronic interstitial coarsening. Clusters of ground-glass density in the right lower lobe new since the prior CT and may represent developing infiltrate. Right upper lobe interstitial nodularity slightly progressed since the prior CT. No consolidative changes. There is no pleural effusion or pneumothorax. The central airways are patent. Upper Abdomen: No acute abnormality. Musculoskeletal: Right shoulder arthroplasty. No acute osseous pathology. Osteopenia with degenerative changes. IMPRESSION: 1. No acute/traumatic intrathoracic pathology. 2. Clusters of ground-glass density in the right lower lobe new since the prior CT and may represent developing infiltrate. 3.  Aortic Atherosclerosis (  ICD10-I70.0). Electronically Signed   By: Vanetta Chou M.D.   On: 12/27/2023 11:24   CT T-SPINE NO CHARGE Result Date: 12/27/2023 CLINICAL DATA:  Trauma.  Fall. EXAM: CT THORACIC SPINE WITHOUT CONTRAST TECHNIQUE: Multiplanar CT images of the thoracic spine were reconstructed from contemporary CT of the Chest. RADIATION DOSE REDUCTION: This exam was performed according to the departmental dose-optimization program which includes automated exposure control, adjustment of the mA and/or kV according to patient size and/or use of iterative reconstruction technique. CONTRAST:  None. COMPARISON:  CT thoracic spine 11/11/2021. CTA  chest, abdomen, and pelvis 06/26/2023. FINDINGS: Alignment: Minimal scoliosis.  No significant listhesis. Vertebrae: Unchanged mild T1, T9, and T11 compression fractures and T12 superior endplate Schmorl's node. No acute fracture or suspicious lesion. Paraspinal and other soft tissues: No acute abnormality in the paraspinal soft tissues. Remainder of the chest reported separately. Disc levels: Mild disc and facet degeneration without evidence of high-grade stenosis. IMPRESSION: 1. No acute osseous abnormality in the thoracic spine. 2. Unchanged multiple mild chronic compression fractures. Electronically Signed   By: Dasie Hamburg M.D.   On: 12/27/2023 11:15   CT Cervical Spine Wo Contrast Result Date: 12/27/2023 EXAM: CT CERVICAL SPINE WITHOUT CONTRAST 12/27/2023 10:47:25 AM TECHNIQUE: CT of the cervical spine was performed without the administration of intravenous contrast. Multiplanar reformatted images are provided for review. Automated exposure control, iterative reconstruction, and/or weight based adjustment of the mA/kV was utilized to reduce the radiation dose to as low as reasonably achievable. COMPARISON: CT of the cervical spine dated 12/18/2023. CLINICAL HISTORY: Polytrauma, blunt. Trauma; from home for fall that happened around 12 am, fell going to the bathroom, Pt says she doesn't know what cause fall. Endorses hitting the back of her head on the floor, denies LOC. Pt endorses rib pain, chest pain and SOB. FINDINGS: CERVICAL SPINE: BONES AND ALIGNMENT: No acute fracture or traumatic malalignment. Straightening and slight reversal of the normal cervical lordosis is stable. DEGENERATIVE CHANGES: Facet degenerative changes are somewhat more prominent on the left. Uncovertebral spurring is greatest at C6-7, left greater than right. SOFT TISSUES: No prevertebral soft tissue swelling. IMPRESSION: 1. No acute abnormality of the cervical spine related to the reported trauma. 2. Stable straightening and slight  reversal of the normal cervical lordosis. 3. Facet degenerative changes, somewhat more prominent on the left. 4. Uncovertebral spurring, greatest at C6-7, left greater than right. Electronically signed by: Lonni Necessary MD 12/27/2023 11:12 AM EDT RP Workstation: [id number]   CT Lumbar Spine Wo Contrast Result Date: 12/27/2023 CLINICAL DATA:  Low back pain, trauma.  Fall. EXAM: CT LUMBAR SPINE WITHOUT CONTRAST TECHNIQUE: Multidetector CT imaging of the lumbar spine was performed without intravenous contrast administration. Multiplanar CT image reconstructions were also generated. RADIATION DOSE REDUCTION: This exam was performed according to the departmental dose-optimization program which includes automated exposure control, adjustment of the mA and/or kV according to patient size and/or use of iterative reconstruction technique. COMPARISON:  CT abdomen and pelvis 12/25/2023. CT lumbar spine 11/11/2021. FINDINGS: Segmentation: 5 lumbar type vertebrae. Alignment: Mild lumbar levoscoliosis. Unchanged trace retrolisthesis of L2 on L3. Vertebrae: Unchanged mild chronic L2 superior endplate compression fracture. Degenerative endplate changes at L4-5 including unchanged L4 inferior endplate Schmorl's node. No acute fracture or suspicious lesion. Paraspinal and other soft tissues: Abdominal aortic atherosclerosis without aneurysm. Disc levels: Mild disc space narrowing at L1-2, L3-4, and L4-5. Predominantly mild disc bulging throughout the lumbar spine. Severe facet arthrosis on the right at L2-3 and L3-4 and bilaterally  at L4-5 and L5-S1. No evidence of high-grade spinal canal or neural foraminal stenosis. IMPRESSION: 1. No acute osseous abnormality. 2. Unchanged mild chronic L2 compression fracture. 3. Lumbar disc and facet degeneration without evidence of high-grade stenosis. 4.  Aortic Atherosclerosis (ICD10-I70.0). Electronically Signed   By: Dasie Hamburg M.D.   On: 12/27/2023 11:08   CT Head Wo  Contrast Result Date: 12/27/2023 EXAM: CT HEAD WITHOUT CONTRAST 12/27/2023 10:47:25 AM TECHNIQUE: CT of the head was performed without the administration of intravenous contrast. Automated exposure control, iterative reconstruction, and/or weight based adjustment of the mA/kV was utilized to reduce the radiation dose to as low as reasonably achievable. COMPARISON: None available. CLINICAL HISTORY: Polytrauma, blunt. Trauma; from home for fall that happened around 12 am, fell going to the bathroom, Pt says she doesn't know what cause fall. Endorses hitting the back of her head on the floor, denies LOC. Pt endorses rib pain, chest pain and SOB. FINDINGS: BRAIN AND VENTRICLES: No acute hemorrhage. Gray-white differentiation is preserved. No hydrocephalus. No extra-axial collection. No mass effect or midline shift. Moderate periventricular subcortical white matter hypoattenuation is similar to the prior exam. Mild generalized atrophy is again noted. ORBITS: Bilateral lens replacements are noted. The globes and orbits are otherwise within normal limits. SINUSES: No acute abnormality. SOFT TISSUES AND SKULL: No acute soft tissue abnormality. No skull fracture. IMPRESSION: 1. No acute intracranial abnormality related to the reported trauma. 2. Moderate periventricular subcortical white matter hypoattenuation and mild generalized atrophy, similar to the prior exam. This likely reflects the sequelae of chronic microvascular ischemia. Electronically signed by: Lonni Necessary MD 12/27/2023 11:03 AM EDT RP Workstation: [id number]   DG Chest Portable 1 View Result Date: 12/27/2023 CLINICAL DATA:  Fall and left chest wall pain. EXAM: PORTABLE CHEST 1 VIEW COMPARISON:  Chest radiograph dated 12/18/2023. FINDINGS: No focal consolidation, pleural effusion, or pneumothorax. Mild cardiomegaly. No acute osseous pathology. Right shoulder arthroplasty. IMPRESSION: 1. No active disease. 2. Mild cardiomegaly. Electronically Signed    By: Vanetta Chou M.D.   On: 12/27/2023 10:44   CT ABDOMEN PELVIS WO CONTRAST Result Date: 12/25/2023 CLINICAL DATA:  Abdominal pain EXAM: CT ABDOMEN AND PELVIS WITHOUT CONTRAST TECHNIQUE: Multidetector CT imaging of the abdomen and pelvis was performed following the standard protocol without IV contrast. RADIATION DOSE REDUCTION: This exam was performed according to the departmental dose-optimization program which includes automated exposure control, adjustment of the mA and/or kV according to patient size and/or use of iterative reconstruction technique. COMPARISON:  CT 12/18/2023, 10/23/2023, 10/12/2023 FINDINGS: Lower chest: Lung bases demonstrate incompletely visualized heterogeneous airspace disease in the right middle and lower lobes. Cardiomegaly with mitral annular calcification. Coronary vascular calcification. Hepatobiliary: No focal liver abnormality is seen. No gallstones, gallbladder wall thickening, or biliary dilatation. Pancreas: Unremarkable. No pancreatic ductal dilatation or surrounding inflammatory changes. Spleen: Normal in size without focal abnormality. Adrenals/Urinary Tract: Adrenal glands are normal. Kidneys show no hydronephrosis. Bladder shows possible slight thick wall and minimal perivesical stranding. Stomach/Bowel: Stomach nonenlarged. No dilated small bowel. Negative appendix. Diverticular disease of the sigmoid colon. Question very subtle fat stranding in the fat adjacent to sigmoid colon, series 5, image 49. And series 2, image 69. Questioned area of sigmoid colon narrowing is less apparent in the absence of contrast, may be visualized on series 2, image 74. Vascular/Lymphatic: Aortic atherosclerosis. No enlarged abdominal or pelvic lymph nodes. Reproductive: Uterus and bilateral adnexa are unremarkable. Other: Negative for pelvic effusion or free air. Fat containing right groin hernia Musculoskeletal:  No acute or suspicious osseous abnormality. Mild chronic superior  endplate deformities T11 and T12 as well as inferior endplate of T9. IMPRESSION: 1. Diverticular disease of the sigmoid colon. Question very subtle fat stranding in the fat adjacent to the sigmoid colon, cannot exclude very mild acute diverticulitis. Questioned area of sigmoid colon narrowing is less apparent in the absence of contrast, recommend correlation with colonoscopy if not already performed. 2. Possible slight thick wall of the bladder with minimal perivesical stranding, correlate with urinalysis to exclude cystitis. 3. Incompletely visualized heterogeneous airspace disease in the right middle and lower lobes, suspect for pneumonia, possible atypical organism. 4. Cardiomegaly. 5. Aortic atherosclerosis. Aortic Atherosclerosis (ICD10-I70.0). Electronically Signed   By: Luke Bun M.D.   On: 12/25/2023 19:36   DG Hip Unilat W or Wo Pelvis 2-3 Views Right Result Date: 12/18/2023 CLINICAL DATA:  Pain after fall at home. EXAM: DG HIP (WITH OR WITHOUT PELVIS) 2-3V RIGHT; DG HIP (WITH OR WITHOUT PELVIS) 2-3V LEFT COMPARISON:  Abdominopelvic CT earlier today FINDINGS: Right hip: No acute fracture or dislocation. Femoral head is well seated. Slight hip joint space narrowing with acetabular spurring. Left hip: No acute fracture or dislocation. Femoral head is well seated. Slight hip joint space narrowing with acetabular spurring. Pelvis: No acute fracture. Pubic rami are intact. No pubic symphyseal or sacroiliac diastasis. IMPRESSION: 1. No acute fracture or dislocation of the bilateral hips or pelvis. 2. Mild bilateral hip osteoarthritis. Electronically Signed   By: Andrea Gasman M.D.   On: 12/18/2023 18:27   DG Hip Unilat W or Wo Pelvis 2-3 Views Left Result Date: 12/18/2023 CLINICAL DATA:  Pain after fall at home. EXAM: DG HIP (WITH OR WITHOUT PELVIS) 2-3V RIGHT; DG HIP (WITH OR WITHOUT PELVIS) 2-3V LEFT COMPARISON:  Abdominopelvic CT earlier today FINDINGS: Right hip: No acute fracture or dislocation.  Femoral head is well seated. Slight hip joint space narrowing with acetabular spurring. Left hip: No acute fracture or dislocation. Femoral head is well seated. Slight hip joint space narrowing with acetabular spurring. Pelvis: No acute fracture. Pubic rami are intact. No pubic symphyseal or sacroiliac diastasis. IMPRESSION: 1. No acute fracture or dislocation of the bilateral hips or pelvis. 2. Mild bilateral hip osteoarthritis. Electronically Signed   By: Andrea Gasman M.D.   On: 12/18/2023 18:27   DG Thoracic Spine 2 View Result Date: 12/18/2023 CLINICAL DATA:  Pain after fall at home. EXAM: THORACIC SPINE 2 VIEWS COMPARISON:  Thoracic spine radiograph 07/13/2023, included portions from abdominopelvic CT earlier today FINDINGS: Slight broad-based dextroscoliotic curvature perineum chronic thoracic kyphosis. Subtle T9 and T11 compression deformities are remote when compared with abdominal CT earlier today. There is no evidence of acute fracture. Posterior elements are grossly intact. IMPRESSION: 1. No acute fracture of the thoracic spine. 2. Subtle T9 and T11 compression deformities are remote when compared with abdominal CT earlier today. Electronically Signed   By: Andrea Gasman M.D.   On: 12/18/2023 18:26   DG Chest 1 View Result Date: 12/18/2023 CLINICAL DATA:  Fall at home. EXAM: CHEST  1 VIEW COMPARISON:  Lung bases from abdominal CT earlier today. Chest radiograph 10/06/2023. Chest CT 06/26/2023 FINDINGS: Chronic cardiomegaly. Unchanged mediastinal contours. Ill-defined opacity in the right mid lower lung zone. No pneumothorax or pleural effusion. Right shoulder arthroplasty. Irregularity of the left proximal humerus is likely chronic, but only partially included on prior images. IMPRESSION: 1. Ill-defined opacity in the right mid lower lung zone, may represent atelectasis or pneumonia. 2. Chronic  cardiomegaly. Electronically Signed   By: Andrea Gasman M.D.   On: 12/18/2023 18:25   CT  Cervical Spine Wo Contrast Result Date: 12/18/2023 CLINICAL DATA:  Provided history: Neck trauma (Age >= 65y) Fall at home. EXAM: CT CERVICAL SPINE WITHOUT CONTRAST TECHNIQUE: Multidetector CT imaging of the cervical spine was performed without intravenous contrast. Multiplanar CT image reconstructions were also generated. RADIATION DOSE REDUCTION: This exam was performed according to the departmental dose-optimization program which includes automated exposure control, adjustment of the mA and/or kV according to patient size and/or use of iterative reconstruction technique. COMPARISON:  CT cervical spine 05/14/2023 FINDINGS: Alignment: Chronic straightening and broad-based reversal of normal lordosis. No traumatic subluxation. Skull base and vertebrae: No acute fracture. Vertebral body heights are maintained. The dens and skull base are intact. None fusion posterior arch of C1. Soft tissues and spinal canal: No prevertebral fluid or swelling. No visible canal hematoma. Disc levels: Multilevel disc space narrowing and spurring, without significant change from prior exam. Upper chest: Motion artifact limitations. Other: None. IMPRESSION: 1. No acute fracture or subluxation of the cervical spine. 2. Multilevel degenerative disc disease. Electronically Signed   By: Andrea Gasman M.D.   On: 12/18/2023 18:23   CT Head Wo Contrast Result Date: 12/18/2023 CLINICAL DATA:  Head trauma, minor (Age >= 65y) Fall at home. EXAM: CT HEAD WITHOUT CONTRAST TECHNIQUE: Contiguous axial images were obtained from the base of the skull through the vertex without intravenous contrast. RADIATION DOSE REDUCTION: This exam was performed according to the departmental dose-optimization program which includes automated exposure control, adjustment of the mA and/or kV according to patient size and/or use of iterative reconstruction technique. COMPARISON:  Head CT 06/10/2023 FINDINGS: Brain: No intracranial hemorrhage, mass effect, or  midline shift. Stable degree of atrophy. No hydrocephalus. The basilar cisterns are patent. Stable periventricular chronic small vessel ischemic change. No evidence of territorial infarct or acute ischemia. No extra-axial or intracranial fluid collection. Vascular: Atherosclerosis of skullbase vasculature without hyperdense vessel or abnormal calcification. Skull: No fracture or focal lesion. Sinuses/Orbits: No acute finding. Other: None. IMPRESSION: 1. No acute intracranial abnormality. No skull fracture. 2. Stable atrophy and chronic small vessel ischemic change. Electronically Signed   By: Andrea Gasman M.D.   On: 12/18/2023 18:18   CT ABDOMEN PELVIS WO CONTRAST Result Date: 12/18/2023 CLINICAL DATA:  Provided history: Diverticulitis, complication suspected Technologist notes state: Fall at home. EXAM: CT ABDOMEN AND PELVIS WITHOUT CONTRAST TECHNIQUE: Multidetector CT imaging of the abdomen and pelvis was performed following the standard protocol without IV contrast. RADIATION DOSE REDUCTION: This exam was performed according to the departmental dose-optimization program which includes automated exposure control, adjustment of the mA and/or kV according to patient size and/or use of iterative reconstruction technique. COMPARISON:  Abdominopelvic CT 10/23/2023 FINDINGS: Lower chest: Ill-defined airspace disease in the right lower lobe, series 4, image 22. No pleural effusion. Hepatobiliary: No focal liver abnormality on this unenhanced exam. Gallbladder physiologically distended, no calcified stone. No biliary dilatation. Pancreas: No ductal dilatation or inflammation. Spleen: Normal in size without focal abnormality. Adrenals/Urinary Tract: No suspicious adrenal nodule or evidence of adrenal hemorrhage. No renal calculi or hydronephrosis. Lobulated renal contours. Partially distended urinary bladder, equivocal perivesicular fat stranding. Stomach/Bowel: Unremarkable appearance of the stomach. Duodenal  diverticulum without inflammation. No small bowel obstruction or inflammatory change. Normal appendix. Moderate volume of stool in the colon. Scattered colonic diverticula without acute inflammation. Questionable area of wall thickening/narrowing in the proximal sigmoid series 2, image 76, at  site of previous mural thickening. Vascular/Lymphatic: Aortic atherosclerosis. No aneurysm. No enlarged lymph nodes in the abdomen or pelvis. Reproductive: Uterus and bilateral adnexa are unremarkable. Other: No free air or free fluid. Again seen fat containing right inguinal hernia. Musculoskeletal: No acute fracture of the pelvis, lumbar spine or included thoracic spine. Multilevel degenerative change in the spine with scattered Schmorl's nodes. Chronic appearing compression deformities of T9 and T11, unchanged from prior exam. IMPRESSION: 1. No evidence of diverticulitis. No acute findings or posttraumatic sequela. 2. Ill-defined airspace disease in the right lower lobe, atelectasis versus pneumonia. 3. Colonic diverticulosis without acute inflammation. 4. Questionable area of wall thickening/narrowing in the proximal sigmoid colon, at site of previous mural thickening. This may represent stricture related to prior inflammation, however cannot exclude colonic neoplasm. Recommend correlation with colonoscopy if not recently performed. Aortic Atherosclerosis (ICD10-I70.0). Electronically Signed   By: Andrea Gasman M.D.   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 PE limited due to patient unable to tolerate exam due to recent falls. Most recent CT on [facility] on 01/12/2024 is reassuring from an oncologic standpoint.  Her posttreatment MRI also showed her to be in remission.  She will continue to follow with GI for her rectal  bleeding. She is scheduled to see them next week and for colonoscopy later this month.    Plan: Per NCCN guidelines, patient will follow up with Dr. Eldonna in 3 months and radiation oncology in 6 months. Patient was educated on signs/symptoms that may indicate disease recurrence and understands to notify us  if she experiences any of them.     35 minutes of total time was spent for this patient encounter, including preparation, face-to-face counseling with the patient and coordination of care, physical exam, and documentation of the encounter. ____________________________________   Leeroy Due, PA-C   Lynwood CHARM Nasuti, PhD, MD   [facility]  Radiation Oncology Direct Dial : [phone]  Fax: [phone] [email or web address]    This document serves as a record of services personally performed by Lynwood Nasuti, MD. It was created on his behalf by Reymundo Cartwright, a trained medical scribe. The creation of this record is based on the scribe's personal observations and the provider's statements to them. This document has been checked and approved by the attending provider.

## 2024-01-14 ENCOUNTER — Telehealth: Payer: Self-pay | Admitting: Orthopedic Surgery

## 2024-01-14 ENCOUNTER — Ambulatory Visit
Admission: RE | Admit: 2024-01-14 | Discharge: 2024-01-14 | Disposition: A | Discharge status: Home / Self Care | Payer: Medicare Other | Source: Ambulatory Visit | Attending: Radiation Oncology | Admitting: Radiation Oncology

## 2024-01-14 ENCOUNTER — Encounter: Payer: Self-pay | Admitting: Radiation Oncology

## 2024-01-14 VITALS — Resp 20

## 2024-01-14 DIAGNOSIS — M533 Sacrococcygeal disorders, not elsewhere classified: Secondary | ICD-10-CM | POA: Insufficient documentation

## 2024-01-14 DIAGNOSIS — I517 Cardiomegaly: Secondary | ICD-10-CM | POA: Diagnosis not present

## 2024-01-14 DIAGNOSIS — Z794 Long term (current) use of insulin: Secondary | ICD-10-CM | POA: Diagnosis not present

## 2024-01-14 DIAGNOSIS — Z7984 Long term (current) use of oral hypoglycemic drugs: Secondary | ICD-10-CM | POA: Diagnosis not present

## 2024-01-14 DIAGNOSIS — Z79899 Other long term (current) drug therapy: Secondary | ICD-10-CM | POA: Diagnosis not present

## 2024-01-14 DIAGNOSIS — M47812 Spondylosis without myelopathy or radiculopathy, cervical region: Secondary | ICD-10-CM | POA: Diagnosis not present

## 2024-01-14 DIAGNOSIS — Z791 Long term (current) use of non-steroidal anti-inflammatories (NSAID): Secondary | ICD-10-CM | POA: Diagnosis not present

## 2024-01-14 DIAGNOSIS — C541 Malignant neoplasm of endometrium: Secondary | ICD-10-CM | POA: Diagnosis not present

## 2024-01-14 DIAGNOSIS — K5730 Diverticulosis of large intestine without perforation or abscess without bleeding: Secondary | ICD-10-CM | POA: Insufficient documentation

## 2024-01-14 DIAGNOSIS — Z923 Personal history of irradiation: Secondary | ICD-10-CM | POA: Insufficient documentation

## 2024-01-14 DIAGNOSIS — I6782 Cerebral ischemia: Secondary | ICD-10-CM | POA: Diagnosis not present

## 2024-01-14 DIAGNOSIS — I700 Atherosclerosis of aorta: Secondary | ICD-10-CM | POA: Insufficient documentation

## 2024-01-14 DIAGNOSIS — M5136 Other intervertebral disc degeneration, lumbar region with discogenic back pain only: Secondary | ICD-10-CM | POA: Diagnosis not present

## 2024-01-14 DIAGNOSIS — M546 Pain in thoracic spine: Secondary | ICD-10-CM | POA: Insufficient documentation

## 2024-01-14 DIAGNOSIS — I3481 Nonrheumatic mitral (valve) annulus calcification: Secondary | ICD-10-CM | POA: Insufficient documentation

## 2024-01-14 DIAGNOSIS — K625 Hemorrhage of anus and rectum: Secondary | ICD-10-CM | POA: Diagnosis not present

## 2024-01-14 DIAGNOSIS — S0003XA Contusion of scalp, initial encounter: Secondary | ICD-10-CM | POA: Insufficient documentation

## 2024-01-14 DIAGNOSIS — Z7901 Long term (current) use of anticoagulants: Secondary | ICD-10-CM | POA: Diagnosis not present

## 2024-01-14 NOTE — Progress Notes (Signed)
 Amber Stephenson is here today for follow up post radiation to the pelvic.  They completed their radiation on: 03/31/2023  Does the patient complain of any of the following:  Pain: 7/10 Abdomen and 7/10 lower back Abdominal bloating: Yes Diarrhea/Constipation: Taking Imodium  as needed. Nausea/Vomiting: Denies Vaginal Discharge: Denies Blood in Urine or Stool: Blood was present when she wipes. She has an upcoming appointment  01/29/24 Urinary Issues (dysuria/incomplete emptying/ incontinence/ increased frequency/urgency): Yes to frequency and urgency. Currently on abx Does patient report using vaginal dilator 2-3 times a week and/or sexually active 2-3 weeks: She reports not using vaginal dilators or being sexually active. Post radiation skin changes: She reports somre redness.   Additional comments if applicable:  BP (!) (P) 154/74 (BP Location: Left Arm, Patient Position: Sitting)   Pulse (P) 63   Temp (!) (P) 97.1 F (36.2 C) (Temporal)   Resp 20   Ht (P) 5' 2 (1.575 m)   Wt (P) 179 lb 8 oz (81.4 kg)   BMI (P) 32.83 kg/m

## 2024-01-14 NOTE — Telephone Encounter (Signed)
 Returned the pt's call, lvm for her to cb.  She went to the ED & xrays 01/12/24, NP

## 2024-01-14 NOTE — Telephone Encounter (Signed)
 Patient aware and verbalized understanding.

## 2024-01-15 ENCOUNTER — Other Ambulatory Visit

## 2024-01-15 ENCOUNTER — Ambulatory Visit (INDEPENDENT_AMBULATORY_CARE_PROVIDER_SITE_OTHER): Admitting: *Deleted

## 2024-01-15 DIAGNOSIS — Z7984 Long term (current) use of oral hypoglycemic drugs: Secondary | ICD-10-CM | POA: Diagnosis not present

## 2024-01-15 DIAGNOSIS — Z7985 Long-term (current) use of injectable non-insulin antidiabetic drugs: Secondary | ICD-10-CM | POA: Diagnosis not present

## 2024-01-15 DIAGNOSIS — M810 Age-related osteoporosis without current pathological fracture: Secondary | ICD-10-CM

## 2024-01-15 DIAGNOSIS — R296 Repeated falls: Secondary | ICD-10-CM | POA: Diagnosis not present

## 2024-01-15 DIAGNOSIS — Z7901 Long term (current) use of anticoagulants: Secondary | ICD-10-CM | POA: Diagnosis not present

## 2024-01-15 DIAGNOSIS — Z794 Long term (current) use of insulin: Secondary | ICD-10-CM | POA: Diagnosis not present

## 2024-01-15 DIAGNOSIS — K625 Hemorrhage of anus and rectum: Secondary | ICD-10-CM | POA: Diagnosis not present

## 2024-01-15 DIAGNOSIS — L89321 Pressure ulcer of left buttock, stage 1: Secondary | ICD-10-CM | POA: Diagnosis not present

## 2024-01-15 NOTE — Progress Notes (Signed)
 Prolia  injection given left arm subcutaneous. Patient tolerated well.

## 2024-01-16 LAB — COMPREHENSIVE METABOLIC PANEL WITH GFR
ALT: 7 IU/L (ref 0–32)
AST: 24 IU/L (ref 0–40)
Albumin: 3.8 g/dL (ref 3.8–4.8)
Alkaline Phosphatase: 136 IU/L — ABNORMAL HIGH (ref 44–121)
BUN/Creatinine Ratio: 18 (ref 12–28)
BUN: 17 mg/dL (ref 8–27)
Bilirubin Total: 0.4 mg/dL (ref 0.0–1.2)
CO2: 20 mmol/L (ref 20–29)
Calcium: 8.6 mg/dL — ABNORMAL LOW (ref 8.7–10.3)
Chloride: 93 mmol/L — ABNORMAL LOW (ref 96–106)
Creatinine, Ser: 0.93 mg/dL (ref 0.57–1.00)
Globulin, Total: 2.4 g/dL (ref 1.5–4.5)
Glucose: 98 mg/dL (ref 70–99)
Potassium: 4.6 mmol/L (ref 3.5–5.2)
Sodium: 126 mmol/L — ABNORMAL LOW (ref 134–144)
Total Protein: 6.2 g/dL (ref 6.0–8.5)
eGFR: 64 mL/min/1.73 (ref >59)

## 2024-01-16 LAB — CBC WITH DIFFERENTIAL/PLATELET
Basophils Absolute: 0 x10E3/uL (ref 0.0–0.2)
Basos: 1 %
EOS (ABSOLUTE): 0.2 x10E3/uL (ref 0.0–0.4)
Eos: 6 %
Hematocrit: 26 % — ABNORMAL LOW (ref 34.0–46.6)
Hemoglobin: 7.9 g/dL — ABNORMAL LOW (ref 11.1–15.9)
Immature Grans (Abs): 0 x10E3/uL (ref 0.0–0.1)
Immature Granulocytes: 0 %
Lymphocytes Absolute: 0.5 x10E3/uL — ABNORMAL LOW (ref 0.7–3.1)
Lymphs: 14 %
MCH: 26.3 pg — ABNORMAL LOW (ref 26.6–33.0)
MCHC: 30.4 g/dL — ABNORMAL LOW (ref 31.5–35.7)
MCV: 87 fL (ref 79–97)
Monocytes Absolute: 0.4 x10E3/uL (ref 0.1–0.9)
Monocytes: 13 %
Neutrophils Absolute: 2.3 x10E3/uL (ref 1.4–7.0)
Neutrophils: 65 %
Platelets: 176 x10E3/uL (ref 150–450)
RBC: 3 x10E6/uL — ABNORMAL LOW (ref 3.77–5.28)
RDW: 16.9 % — ABNORMAL HIGH (ref 11.7–15.4)
WBC: 3.5 x10E3/uL (ref 3.4–10.8)

## 2024-01-18 ENCOUNTER — Ambulatory Visit (INDEPENDENT_AMBULATORY_CARE_PROVIDER_SITE_OTHER)

## 2024-01-18 DIAGNOSIS — Z794 Long term (current) use of insulin: Secondary | ICD-10-CM

## 2024-01-18 DIAGNOSIS — Z9181 History of falling: Secondary | ICD-10-CM | POA: Diagnosis not present

## 2024-01-18 DIAGNOSIS — Z79899 Other long term (current) drug therapy: Secondary | ICD-10-CM

## 2024-01-18 DIAGNOSIS — L89321 Pressure ulcer of left buttock, stage 1: Secondary | ICD-10-CM | POA: Diagnosis not present

## 2024-01-18 DIAGNOSIS — R296 Repeated falls: Secondary | ICD-10-CM | POA: Diagnosis not present

## 2024-01-18 DIAGNOSIS — Z7901 Long term (current) use of anticoagulants: Secondary | ICD-10-CM | POA: Diagnosis not present

## 2024-01-18 DIAGNOSIS — Z7985 Long-term (current) use of injectable non-insulin antidiabetic drugs: Secondary | ICD-10-CM

## 2024-01-18 DIAGNOSIS — Z7984 Long term (current) use of oral hypoglycemic drugs: Secondary | ICD-10-CM | POA: Diagnosis not present

## 2024-01-19 ENCOUNTER — Encounter (HOSPITAL_COMMUNITY): Payer: Self-pay | Admitting: Family Medicine

## 2024-01-19 ENCOUNTER — Inpatient Hospital Stay (HOSPITAL_COMMUNITY)
Admission: EM | Admit: 2024-01-19 | Discharge: 2024-01-23 | DRG: 393 | Disposition: A | Discharge status: Home Health | Attending: Family Medicine | Admitting: Family Medicine

## 2024-01-19 ENCOUNTER — Inpatient Hospital Stay (HOSPITAL_COMMUNITY)

## 2024-01-19 ENCOUNTER — Other Ambulatory Visit: Payer: Self-pay

## 2024-01-19 ENCOUNTER — Ambulatory Visit: Admitting: Internal Medicine

## 2024-01-19 DIAGNOSIS — I272 Pulmonary hypertension, unspecified: Secondary | ICD-10-CM | POA: Diagnosis present

## 2024-01-19 DIAGNOSIS — R197 Diarrhea, unspecified: Principal | ICD-10-CM | POA: Diagnosis present

## 2024-01-19 DIAGNOSIS — I110 Hypertensive heart disease with heart failure: Secondary | ICD-10-CM | POA: Diagnosis present

## 2024-01-19 DIAGNOSIS — R109 Unspecified abdominal pain: Secondary | ICD-10-CM

## 2024-01-19 DIAGNOSIS — G894 Chronic pain syndrome: Secondary | ICD-10-CM | POA: Diagnosis present

## 2024-01-19 DIAGNOSIS — K253 Acute gastric ulcer without hemorrhage or perforation: Secondary | ICD-10-CM

## 2024-01-19 DIAGNOSIS — Z96611 Presence of right artificial shoulder joint: Secondary | ICD-10-CM | POA: Diagnosis present

## 2024-01-19 DIAGNOSIS — Z91041 Radiographic dye allergy status: Secondary | ICD-10-CM

## 2024-01-19 DIAGNOSIS — I482 Chronic atrial fibrillation, unspecified: Secondary | ICD-10-CM | POA: Diagnosis present

## 2024-01-19 DIAGNOSIS — Z7985 Long-term (current) use of injectable non-insulin antidiabetic drugs: Secondary | ICD-10-CM

## 2024-01-19 DIAGNOSIS — R1084 Generalized abdominal pain: Secondary | ICD-10-CM | POA: Diagnosis not present

## 2024-01-19 DIAGNOSIS — Z923 Personal history of irradiation: Secondary | ICD-10-CM

## 2024-01-19 DIAGNOSIS — K5521 Angiodysplasia of colon with hemorrhage: Secondary | ICD-10-CM | POA: Diagnosis present

## 2024-01-19 DIAGNOSIS — E119 Type 2 diabetes mellitus without complications: Secondary | ICD-10-CM | POA: Diagnosis not present

## 2024-01-19 DIAGNOSIS — Z7983 Long term (current) use of bisphosphonates: Secondary | ICD-10-CM

## 2024-01-19 DIAGNOSIS — C541 Malignant neoplasm of endometrium: Secondary | ICD-10-CM | POA: Diagnosis present

## 2024-01-19 DIAGNOSIS — Z888 Allergy status to other drugs, medicaments and biological substances status: Secondary | ICD-10-CM

## 2024-01-19 DIAGNOSIS — S2232XD Fracture of one rib, left side, subsequent encounter for fracture with routine healing: Secondary | ICD-10-CM

## 2024-01-19 DIAGNOSIS — Z820 Family history of epilepsy and other diseases of the nervous system: Secondary | ICD-10-CM

## 2024-01-19 DIAGNOSIS — R531 Weakness: Secondary | ICD-10-CM

## 2024-01-19 DIAGNOSIS — K625 Hemorrhage of anus and rectum: Secondary | ICD-10-CM | POA: Diagnosis not present

## 2024-01-19 DIAGNOSIS — I6782 Cerebral ischemia: Secondary | ICD-10-CM | POA: Diagnosis not present

## 2024-01-19 DIAGNOSIS — Z7901 Long term (current) use of anticoagulants: Secondary | ICD-10-CM

## 2024-01-19 DIAGNOSIS — F419 Anxiety disorder, unspecified: Secondary | ICD-10-CM | POA: Diagnosis present

## 2024-01-19 DIAGNOSIS — Z79899 Other long term (current) drug therapy: Secondary | ICD-10-CM

## 2024-01-19 DIAGNOSIS — D123 Benign neoplasm of transverse colon: Secondary | ICD-10-CM | POA: Diagnosis present

## 2024-01-19 DIAGNOSIS — Z8249 Family history of ischemic heart disease and other diseases of the circulatory system: Secondary | ICD-10-CM

## 2024-01-19 DIAGNOSIS — Z860101 Personal history of adenomatous and serrated colon polyps: Secondary | ICD-10-CM | POA: Diagnosis not present

## 2024-01-19 DIAGNOSIS — E1140 Type 2 diabetes mellitus with diabetic neuropathy, unspecified: Secondary | ICD-10-CM | POA: Diagnosis present

## 2024-01-19 DIAGNOSIS — N179 Acute kidney failure, unspecified: Secondary | ICD-10-CM | POA: Diagnosis present

## 2024-01-19 DIAGNOSIS — K254 Chronic or unspecified gastric ulcer with hemorrhage: Secondary | ICD-10-CM | POA: Diagnosis not present

## 2024-01-19 DIAGNOSIS — E1165 Type 2 diabetes mellitus with hyperglycemia: Secondary | ICD-10-CM | POA: Diagnosis present

## 2024-01-19 DIAGNOSIS — Y842 Radiological procedure and radiotherapy as the cause of abnormal reaction of the patient, or of later complication, without mention of misadventure at the time of the procedure: Secondary | ICD-10-CM | POA: Diagnosis present

## 2024-01-19 DIAGNOSIS — R9389 Abnormal findings on diagnostic imaging of other specified body structures: Secondary | ICD-10-CM | POA: Diagnosis not present

## 2024-01-19 DIAGNOSIS — K5520 Angiodysplasia of colon without hemorrhage: Secondary | ICD-10-CM | POA: Diagnosis not present

## 2024-01-19 DIAGNOSIS — K760 Fatty (change of) liver, not elsewhere classified: Secondary | ICD-10-CM | POA: Diagnosis present

## 2024-01-19 DIAGNOSIS — Z8542 Personal history of malignant neoplasm of other parts of uterus: Secondary | ICD-10-CM | POA: Diagnosis not present

## 2024-01-19 DIAGNOSIS — K5710 Diverticulosis of small intestine without perforation or abscess without bleeding: Secondary | ICD-10-CM | POA: Diagnosis not present

## 2024-01-19 DIAGNOSIS — K259 Gastric ulcer, unspecified as acute or chronic, without hemorrhage or perforation: Secondary | ICD-10-CM | POA: Diagnosis not present

## 2024-01-19 DIAGNOSIS — K635 Polyp of colon: Secondary | ICD-10-CM | POA: Diagnosis not present

## 2024-01-19 DIAGNOSIS — K838 Other specified diseases of biliary tract: Secondary | ICD-10-CM | POA: Diagnosis not present

## 2024-01-19 DIAGNOSIS — Z833 Family history of diabetes mellitus: Secondary | ICD-10-CM

## 2024-01-19 DIAGNOSIS — K121 Other forms of stomatitis: Secondary | ICD-10-CM | POA: Diagnosis present

## 2024-01-19 DIAGNOSIS — Z818 Family history of other mental and behavioral disorders: Secondary | ICD-10-CM

## 2024-01-19 DIAGNOSIS — K4090 Unilateral inguinal hernia, without obstruction or gangrene, not specified as recurrent: Secondary | ICD-10-CM | POA: Diagnosis not present

## 2024-01-19 DIAGNOSIS — K4020 Bilateral inguinal hernia, without obstruction or gangrene, not specified as recurrent: Secondary | ICD-10-CM | POA: Diagnosis not present

## 2024-01-19 DIAGNOSIS — E871 Hypo-osmolality and hyponatremia: Secondary | ICD-10-CM | POA: Diagnosis present

## 2024-01-19 DIAGNOSIS — D62 Acute posthemorrhagic anemia: Secondary | ICD-10-CM | POA: Diagnosis not present

## 2024-01-19 DIAGNOSIS — K520 Gastroenteritis and colitis due to radiation: Principal | ICD-10-CM | POA: Diagnosis present

## 2024-01-19 DIAGNOSIS — K6389 Other specified diseases of intestine: Secondary | ICD-10-CM | POA: Diagnosis not present

## 2024-01-19 DIAGNOSIS — I2510 Atherosclerotic heart disease of native coronary artery without angina pectoris: Secondary | ICD-10-CM | POA: Diagnosis not present

## 2024-01-19 DIAGNOSIS — J9611 Chronic respiratory failure with hypoxia: Secondary | ICD-10-CM | POA: Diagnosis present

## 2024-01-19 DIAGNOSIS — R296 Repeated falls: Secondary | ICD-10-CM | POA: Diagnosis present

## 2024-01-19 DIAGNOSIS — I10 Essential (primary) hypertension: Secondary | ICD-10-CM | POA: Diagnosis present

## 2024-01-19 DIAGNOSIS — K921 Melena: Secondary | ICD-10-CM | POA: Diagnosis not present

## 2024-01-19 DIAGNOSIS — K828 Other specified diseases of gallbladder: Secondary | ICD-10-CM | POA: Diagnosis not present

## 2024-01-19 DIAGNOSIS — D649 Anemia, unspecified: Secondary | ICD-10-CM

## 2024-01-19 DIAGNOSIS — G2581 Restless legs syndrome: Secondary | ICD-10-CM | POA: Diagnosis present

## 2024-01-19 DIAGNOSIS — Z811 Family history of alcohol abuse and dependence: Secondary | ICD-10-CM

## 2024-01-19 DIAGNOSIS — F1120 Opioid dependence, uncomplicated: Secondary | ICD-10-CM | POA: Diagnosis present

## 2024-01-19 DIAGNOSIS — Z91048 Other nonmedicinal substance allergy status: Secondary | ICD-10-CM

## 2024-01-19 DIAGNOSIS — Z91040 Latex allergy status: Secondary | ICD-10-CM

## 2024-01-19 DIAGNOSIS — M47817 Spondylosis without myelopathy or radiculopathy, lumbosacral region: Secondary | ICD-10-CM | POA: Diagnosis not present

## 2024-01-19 DIAGNOSIS — I5032 Chronic diastolic (congestive) heart failure: Secondary | ICD-10-CM | POA: Diagnosis present

## 2024-01-19 DIAGNOSIS — Z7984 Long term (current) use of oral hypoglycemic drugs: Secondary | ICD-10-CM

## 2024-01-19 DIAGNOSIS — Z823 Family history of stroke: Secondary | ICD-10-CM

## 2024-01-19 DIAGNOSIS — F319 Bipolar disorder, unspecified: Secondary | ICD-10-CM | POA: Diagnosis present

## 2024-01-19 DIAGNOSIS — E785 Hyperlipidemia, unspecified: Secondary | ICD-10-CM | POA: Diagnosis present

## 2024-01-19 DIAGNOSIS — R54 Age-related physical debility: Secondary | ICD-10-CM | POA: Diagnosis present

## 2024-01-19 DIAGNOSIS — I517 Cardiomegaly: Secondary | ICD-10-CM | POA: Diagnosis not present

## 2024-01-19 DIAGNOSIS — K5711 Diverticulosis of small intestine without perforation or abscess with bleeding: Secondary | ICD-10-CM | POA: Diagnosis present

## 2024-01-19 DIAGNOSIS — Z794 Long term (current) use of insulin: Secondary | ICD-10-CM

## 2024-01-19 DIAGNOSIS — F4312 Post-traumatic stress disorder, chronic: Secondary | ICD-10-CM | POA: Diagnosis present

## 2024-01-19 DIAGNOSIS — J9601 Acute respiratory failure with hypoxia: Secondary | ICD-10-CM | POA: Diagnosis not present

## 2024-01-19 DIAGNOSIS — K5730 Diverticulosis of large intestine without perforation or abscess without bleeding: Secondary | ICD-10-CM | POA: Diagnosis not present

## 2024-01-19 DIAGNOSIS — I4821 Permanent atrial fibrillation: Secondary | ICD-10-CM

## 2024-01-19 DIAGNOSIS — K7689 Other specified diseases of liver: Secondary | ICD-10-CM | POA: Diagnosis not present

## 2024-01-19 LAB — CBC WITH DIFFERENTIAL/PLATELET
Abs Immature Granulocytes: 0.05 K/uL (ref 0.00–0.07)
Basophils Absolute: 0 K/uL (ref 0.0–0.1)
Basophils Relative: 0 %
Eosinophils Absolute: 0.1 K/uL (ref 0.0–0.5)
Eosinophils Relative: 2 %
HCT: 24 % — ABNORMAL LOW (ref 36.0–46.0)
Hemoglobin: 7.6 g/dL — ABNORMAL LOW (ref 12.0–15.0)
Immature Granulocytes: 1 %
Lymphocytes Relative: 8 %
Lymphs Abs: 0.5 K/uL — ABNORMAL LOW (ref 0.7–4.0)
MCH: 26.6 pg (ref 26.0–34.0)
MCHC: 31.7 g/dL (ref 30.0–36.0)
MCV: 83.9 fL (ref 80.0–100.0)
Monocytes Absolute: 0.8 K/uL (ref 0.1–1.0)
Monocytes Relative: 13 %
Neutro Abs: 4.5 K/uL (ref 1.7–7.7)
Neutrophils Relative %: 76 %
Platelets: 160 K/uL (ref 150–400)
RBC: 2.86 MIL/uL — ABNORMAL LOW (ref 3.87–5.11)
RDW: 17.7 % — ABNORMAL HIGH (ref 11.5–15.5)
WBC: 5.9 K/uL (ref 4.0–10.5)
nRBC: 0.3 % — ABNORMAL HIGH (ref 0.0–0.2)

## 2024-01-19 LAB — SODIUM, URINE, RANDOM: Sodium, Ur: 38 mmol/L

## 2024-01-19 LAB — URINALYSIS, COMPLETE (UACMP) WITH MICROSCOPIC
Bacteria, UA: NONE SEEN
Bilirubin Urine: NEGATIVE
Glucose, UA: NEGATIVE mg/dL
Hgb urine dipstick: NEGATIVE
Ketones, ur: NEGATIVE mg/dL
Leukocytes,Ua: NEGATIVE
Nitrite: NEGATIVE
Protein, ur: 100 mg/dL — AB
Specific Gravity, Urine: 1.011 (ref 1.005–1.030)
pH: 5 (ref 5.0–8.0)

## 2024-01-19 LAB — HEMOGLOBIN AND HEMATOCRIT, BLOOD
HCT: 25.2 % — ABNORMAL LOW (ref 36.0–46.0)
HCT: 23.6 % — ABNORMAL LOW (ref 36.0–46.0)
HCT: 23.9 % — ABNORMAL LOW (ref 36.0–46.0)
Hemoglobin: 8 g/dL — ABNORMAL LOW (ref 12.0–15.0)
Hemoglobin: 7.4 g/dL — ABNORMAL LOW (ref 12.0–15.0)
Hemoglobin: 7.6 g/dL — ABNORMAL LOW (ref 12.0–15.0)

## 2024-01-19 LAB — GASTROINTESTINAL PANEL BY PCR, STOOL (REPLACES STOOL CULTURE)

## 2024-01-19 LAB — COMPREHENSIVE METABOLIC PANEL WITH GFR
ALT: 11 U/L (ref 0–44)
AST: 30 U/L (ref 15–41)
Albumin: 3.3 g/dL — ABNORMAL LOW (ref 3.5–5.0)
Alkaline Phosphatase: 136 U/L — ABNORMAL HIGH (ref 38–126)
Anion gap: 8 (ref 5–15)
BUN: 15 mg/dL (ref 8–23)
CO2: 22 mmol/L (ref 22–32)
Calcium: 7.4 mg/dL — ABNORMAL LOW (ref 8.9–10.3)
Chloride: 93 mmol/L — ABNORMAL LOW (ref 98–111)
Creatinine, Ser: 1.22 mg/dL — ABNORMAL HIGH (ref 0.44–1.00)
GFR, Estimated: 46 mL/min — ABNORMAL LOW
Glucose, Bld: 106 mg/dL — ABNORMAL HIGH (ref 70–99)
Potassium: 3.4 mmol/L — ABNORMAL LOW (ref 3.5–5.1)
Sodium: 123 mmol/L — ABNORMAL LOW (ref 135–145)
Total Bilirubin: 0.8 mg/dL (ref 0.0–1.2)
Total Protein: 6.7 g/dL (ref 6.5–8.1)

## 2024-01-19 LAB — TYPE AND SCREEN
ABO/RH(D): O POS
Antibody Screen: NEGATIVE

## 2024-01-19 LAB — C DIFFICILE QUICK SCREEN W PCR REFLEX
C Diff antigen: NEGATIVE
C Diff interpretation: NOT DETECTED
C Diff toxin: NEGATIVE

## 2024-01-19 LAB — BASIC METABOLIC PANEL WITH GFR
Anion gap: 11 (ref 5–15)
Anion gap: 11 (ref 5–15)
BUN: 14 mg/dL (ref 8–23)
BUN: 12 mg/dL (ref 8–23)
CO2: 21 mmol/L — ABNORMAL LOW (ref 22–32)
CO2: 18 mmol/L — ABNORMAL LOW (ref 22–32)
Calcium: 7.5 mg/dL — ABNORMAL LOW (ref 8.9–10.3)
Calcium: 7.1 mg/dL — ABNORMAL LOW (ref 8.9–10.3)
Chloride: 95 mmol/L — ABNORMAL LOW (ref 98–111)
Chloride: 98 mmol/L (ref 98–111)
Creatinine, Ser: 1.08 mg/dL — ABNORMAL HIGH (ref 0.44–1.00)
Creatinine, Ser: 0.76 mg/dL (ref 0.44–1.00)
GFR, Estimated: 53 mL/min — ABNORMAL LOW (ref >60)
GFR, Estimated: >60 mL/min (ref >60)
Glucose, Bld: 89 mg/dL (ref 70–99)
Glucose, Bld: 75 mg/dL (ref 70–99)
Potassium: 4.4 mmol/L (ref 3.5–5.1)
Potassium: 3.8 mmol/L (ref 3.5–5.1)
Sodium: 127 mmol/L — ABNORMAL LOW (ref 135–145)
Sodium: 127 mmol/L — ABNORMAL LOW (ref 135–145)

## 2024-01-19 LAB — PROTEIN / CREATININE RATIO, URINE
Creatinine, Urine: 113 mg/dL
Protein Creatinine Ratio: 0.59 mg/mg{creat} — ABNORMAL HIGH (ref 0.00–0.15)
Total Protein, Urine: 67 mg/dL

## 2024-01-19 LAB — GLUCOSE, CAPILLARY: Glucose-Capillary: 70 mg/dL (ref 70–99)

## 2024-01-19 LAB — POC OCCULT BLOOD, ED

## 2024-01-19 LAB — OSMOLALITY: Osmolality: 271 mosm/kg — ABNORMAL LOW (ref 275–295)

## 2024-01-19 LAB — OSMOLALITY, URINE: Osmolality, Ur: 298 mosm/kg — ABNORMAL LOW (ref 300–900)

## 2024-01-19 LAB — LACTIC ACID, PLASMA: Lactic Acid, Venous: 1 mmol/L (ref 0.5–1.9)

## 2024-01-19 LAB — LIPASE, BLOOD: Lipase: 21 U/L (ref 11–51)

## 2024-01-19 MED ORDER — HYDROXYZINE HCL 25 MG PO TABS
25.0000 mg | ORAL_TABLET | Freq: Three times a day (TID) | ORAL | Status: DC
  Administered 2024-01-20 – 2024-01-23 (×11): 25 mg via ORAL
  Filled 2024-01-19 (×11): qty 1

## 2024-01-19 MED ORDER — FENTANYL CITRATE (PF) 100 MCG/2ML IJ SOLN
50.0000 ug | INTRAMUSCULAR | Status: DC | PRN
  Administered 2024-01-19 (×2): 50 ug via INTRAVENOUS
  Filled 2024-01-19 (×2): qty 2

## 2024-01-19 MED ORDER — SODIUM CHLORIDE 0.9 % IV BOLUS
1000.0000 mL | Freq: Once | INTRAVENOUS | Status: AC
  Administered 2024-01-19: 1000 mL via INTRAVENOUS

## 2024-01-19 MED ORDER — METOPROLOL TARTRATE 50 MG PO TABS
50.0000 mg | ORAL_TABLET | Freq: Two times a day (BID) | ORAL | Status: DC
  Administered 2024-01-19 – 2024-01-23 (×8): 50 mg via ORAL
  Filled 2024-01-19 (×8): qty 1

## 2024-01-19 MED ORDER — ALPRAZOLAM 1 MG PO TABS
1.0000 mg | ORAL_TABLET | Freq: Every evening | ORAL | Status: DC | PRN
  Administered 2024-01-19 – 2024-01-22 (×4): 1 mg via ORAL
  Filled 2024-01-19 (×4): qty 1

## 2024-01-19 MED ORDER — HYDRALAZINE HCL 20 MG/ML IJ SOLN: 10.0000 mg | INTRAMUSCULAR | Status: DC | PRN

## 2024-01-19 MED ORDER — GABAPENTIN 100 MG PO CAPS
100.0000 mg | ORAL_CAPSULE | Freq: Three times a day (TID) | ORAL | Status: DC
  Administered 2024-01-19 – 2024-01-23 (×11): 100 mg via ORAL
  Filled 2024-01-19 (×11): qty 1

## 2024-01-19 MED ORDER — ACETAMINOPHEN 325 MG PO TABS: 650.0000 mg | ORAL_TABLET | Freq: Four times a day (QID) | ORAL | Status: DC | PRN

## 2024-01-19 MED ORDER — ATORVASTATIN CALCIUM 40 MG PO TABS
40.0000 mg | ORAL_TABLET | Freq: Every day | ORAL | Status: DC
  Administered 2024-01-20 – 2024-01-23 (×4): 40 mg via ORAL
  Filled 2024-01-19 (×4): qty 1

## 2024-01-19 MED ORDER — OXCARBAZEPINE 150 MG PO TABS
150.0000 mg | ORAL_TABLET | Freq: Two times a day (BID) | ORAL | Status: DC
  Administered 2024-01-19 – 2024-01-23 (×8): 150 mg via ORAL
  Filled 2024-01-19 (×11): qty 1

## 2024-01-19 MED ORDER — ARIPIPRAZOLE 2 MG PO TABS
2.0000 mg | ORAL_TABLET | Freq: Every day | ORAL | Status: DC
  Administered 2024-01-20 – 2024-01-22 (×4): 2 mg via ORAL
  Filled 2024-01-19 (×5): qty 1

## 2024-01-19 MED ORDER — CARBIDOPA-LEVODOPA 10-100 MG PO TABS
1.0000 | ORAL_TABLET | Freq: Four times a day (QID) | ORAL | Status: DC
  Administered 2024-01-20 – 2024-01-23 (×13): 1 via ORAL
  Filled 2024-01-19 (×19): qty 1

## 2024-01-19 MED ORDER — FENTANYL CITRATE (PF) 100 MCG/2ML IJ SOLN
100.0000 ug | Freq: Once | INTRAMUSCULAR | Status: AC
  Administered 2024-01-19: 100 ug via INTRAVENOUS
  Filled 2024-01-19: qty 2

## 2024-01-19 MED ORDER — ACETAMINOPHEN 650 MG RE SUPP: 650.0000 mg | Freq: Four times a day (QID) | RECTAL | Status: DC | PRN

## 2024-01-19 MED ORDER — ESCITALOPRAM OXALATE 10 MG PO TABS
10.0000 mg | ORAL_TABLET | Freq: Every day | ORAL | Status: DC
  Administered 2024-01-20 – 2024-01-23 (×4): 10 mg via ORAL
  Filled 2024-01-19 (×5): qty 1

## 2024-01-19 MED ORDER — SODIUM CHLORIDE 0.9 % IV SOLN: INTRAVENOUS | Status: DC

## 2024-01-19 MED ORDER — OXYCODONE HCL ER 20 MG PO T12A
20.0000 mg | EXTENDED_RELEASE_TABLET | Freq: Two times a day (BID) | ORAL | Status: DC
  Administered 2024-01-19 – 2024-01-23 (×8): 20 mg via ORAL
  Filled 2024-01-19 (×8): qty 1

## 2024-01-19 MED ORDER — GADOBUTROL 1 MMOL/ML IV SOLN
8.0000 mL | Freq: Once | INTRAVENOUS | Status: AC | PRN
  Administered 2024-01-19: 8 mL via INTRAVENOUS

## 2024-01-19 MED ORDER — ROPINIROLE HCL 1 MG PO TABS
1.0000 mg | ORAL_TABLET | Freq: Every day | ORAL | Status: DC
  Administered 2024-01-19 – 2024-01-22 (×4): 1 mg via ORAL
  Filled 2024-01-19 (×4): qty 1

## 2024-01-19 MED ORDER — INSULIN ASPART 100 UNIT/ML IJ SOLN
0.0000 [IU] | INTRAMUSCULAR | Status: DC
  Administered 2024-01-20 – 2024-01-22 (×2): 2 [IU] via SUBCUTANEOUS

## 2024-01-19 MED ORDER — HYDROMORPHONE HCL 1 MG/ML IJ SOLN
0.5000 mg | INTRAMUSCULAR | Status: DC | PRN
  Administered 2024-01-19 – 2024-01-22 (×9): 1 mg via INTRAVENOUS
  Filled 2024-01-19 (×9): qty 1

## 2024-01-19 NOTE — ED Triage Notes (Signed)
 Pt c/o diarrhea x Saturday, also endorses abd pain since Saturday that has been off and on, sharp in nature. Rates pain 9/10. Denies N/V. Had a fall recently in which she broke a rib and sustained bruising to the L eye

## 2024-01-19 NOTE — ED Notes (Signed)
 Spoke with ICU nurse who states they are awaiting new H&H before pt could be transported. Contacting hospitalist for further instructions.

## 2024-01-19 NOTE — ED Notes (Signed)
 Pt states she did not take any night/bedtime meds lastnight due to not feeling well.

## 2024-01-19 NOTE — Consult Note (Signed)
 @LOGO @   Referring Provider: [facility]  Primary Care Physician:  [name], MD Primary Gastroenterologist:  Dr. Shaaron  Date of Admission: 01/19/24 Date of Consultation: 01/19/24  Reason for Consultation:  Rectal bleeding, worsening anemia.   HPI:  Amber Stephenson is a 78 y.o. year old female with endometrioid endometrial cancer, medically inoperable, s/p IMRT, Afib on Eliquis , bipolar 1 disorder, CAD, CHF, chronic pain, DM, HTN, Herpes, NAFLD, OSA, osteopenia, RLS, pulmonary htn, adenomatous colon polyp, microscopic colitis,, hospitalized in May 2025 with uncomplicated sigmoid diverticulitis, suspected persistent diverticulitis on repeat CT following discharge and treated with Cipro  and Flagyl ,  chronic intermittent rectal bleeding since Spetember 2023 and intermittent melena since late 2024 currently scheduled for outpatient EGD and colonoscopy 01/29/24, who presented to the emergency room today with complaint of diarrhea and abdominal pain, associated blood mixed in the stool.  ED course: Hypertensive.  Otherwise, vital signs within normal limits.  Labs remarkable for hemoglobin 7.6, sodium 123, potassium 3.4, creatinine 1.22, calcium  7.4, albumin  3.3, alk phos 136.  Fecal occult positive.   C. difficile negative.  GI panel in process.  She received IV fluids, fentanyl  in the ER.  CT A/P with contrast with air-fluid levels in the colon, similar infiltration of the subcutaneous soft tissues of the ventral abdominal pannus, cellulitis not excluded, chronic interstitial changes of the visualized lung bases with similar patchy groundglass nodular densities, concerning for multifocal infection better evaluated on recent CT chest.  Repeat labs today with Sodium improved to 127, creatinine 1.08, hemoglobin 8.0.   Consult: Reports acute onset recurrent watery diarrhea on Saturday with too numerous to count bowel movements per day.  Symptoms persisted and ultimately decided to come  to the ER this morning due to ongoing symptoms.  She is continue to have intermittent rectal bleeding.  States this has been an ongoing issue for quite some time, but seems to be worse after receiving radiation for endometrial cancer last year.  She did not have abdominal pain at home, but developed abdominal pain after presenting to the ER and has been an ongoing issue.  States it is constant, sharp, worse in the right lower quadrant, 8 out of 10 in severity.  Denies melena.  Denies NSAIDs.  Completed 17 days of antibiotics for UTI about 10 days ago.  She has had associated nausea, but no vomiting with her current presentation.  Aside from this, she is continue to have ongoing issues with early satiety.    Last Colonoscopy 2018: hemorrhoids 6mm polyp in cecum, diverticulosis in sigmoid.  Pathology with tubular adenoma.  Random colon biopsies consistent with microscopic colitis.   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   Benign paroxysmal positional vertigo due to bilateral vestibular disorder 05/20/2019   Bipolar 1 disorder (HCC) 01/23/2015   with GAD, benzo dependence   CAD (coronary artery disease)    Nonobstructive; Managed by Dr. Charls   Cardiomegaly 01/12/2018   CHF (congestive heart failure) 06/08/2022   Chronic back pain 01/04/2015   Chronic constipation 04/25/2020   Chronic diastolic heart failure (HCC) 05/30/2015   Chronic pain syndrome 08/22/2019   back pain, sacroiliitis   Chronic post-traumatic stress disorder (PTSD) 12/06/2020   Diabetic neuropathy (HCC) 02/06/2016   Dyslipidemia 09/24/2020  Essential hypertension    Functional diarrhea 10/26/2020   Herpes genitalis in women 07/16/2015    History of adenomatous polyp of colon 05/21/2019   Overview:   03/31/17: Colonoscopy: nonadvanced adenoma, microscopic colitis, f/u 5 yrs, [facility]/GAP   History of radiation therapy    Endometrial- 02/18/23-03/31/23-Dr. Lynwood Kinard   Hypotension 09/01/2022   Insomnia 01/23/2015   Metabolic acidosis 01/12/2023   Migraine headache with aura 02/12/2016   Myofascial pain dysfunction syndrome 08/22/2019   Non-alcoholic fatty liver disease 01/12/2018   OSA (obstructive sleep apnea) 02/24/2019   10/09/2018 - HST  - AHI 40.6    Osteopenia 12/06/2020   Rx alendronate  35 mg.   Pinguecula 12/06/2020   Presbyopia 12/06/2020   Pulmonary hypertension    RLS (restless legs syndrome) 04/27/2015   RSV (respiratory syncytial virus infection) 06/10/2023   Tremor, essential 12/11/2021   Type II diabetes mellitus (HCC) 09/24/2020    Past Surgical History:  Procedure Laterality Date   BREAST REDUCTION SURGERY     COLONOSCOPY  2018   6 mm cecal tubular adenoma, sigmoid diverticulosis, random colon biopsies consistent with microscopic colitis   EYE SURGERY Right    cateracts   HAMMER TOE SURGERY     LEFT HEART CATH AND CORONARY ANGIOGRAPHY N/A 02/03/2018   Procedure: LEFT HEART CATH AND CORONARY ANGIOGRAPHY;  Surgeon: [name], Peter M, MD;  Location: [facility] INVASIVE CV LAB;  Service: Cardiovascular;  Laterality: N/A;   REVERSE SHOULDER ARTHROPLASTY Right 08/19/2019   Procedure: REVERSE SHOULDER ARTHROPLASTY;  Surgeon: Kay Kemps, MD;  Location: [facility] ORS;  Service: Orthopedics;  Laterality: Right;  interscalene block   SHOULDER SURGERY Right    I BROKE MY SHOUDLER   THIGH SURGERY     TO REMOVE A TUMOR     Prior to Admission medications   Medication Sig Start Date End Date Taking? Authorizing Provider  ALPRAZolam  (XANAX ) 1 MG tablet Take 1 mg by mouth at bedtime as needed for anxiety or sleep. 12/21/23   [provider]  ANUCORT-HC  25 MG suppository Place 25 mg rectally daily. 09/28/23    [provider]  ARIPiprazole  (ABILIFY ) 2 MG tablet Take 2 mg by mouth at bedtime. 10/07/21   [provider]  atorvastatin  (LIPITOR) 80 MG tablet Take 1 tablet (80 mg total) by mouth daily. 02/05/23 01/14/24  Lucien Orren SAILOR, PA-C  carbidopa -levodopa  (SINEMET ) 10-100 MG tablet Take 1 tablet by mouth 4 (four) times daily. For restless leg syndrome 12/01/23   [name], MD  colchicine  0.6 MG tablet Take 1 tablet (0.6 mg total) by mouth 2 (two) times daily. For mouth sores 01/07/24   [name], MD  Continuous Glucose Sensor (DEXCOM G7 SENSOR) MISC CHANGE SENSOR EVERY 10 DAYS 09/28/23   Shamleffer, Donell Cardinal, MD  cyanocobalamin  (VITAMIN B12) 1000 MCG tablet Take 1 tablet (1,000 mcg total) by mouth daily. 09/04/22   Ghimire, Donalda HERO, MD  denosumab  (PROLIA ) 60 MG/ML SOSY injection Inject 60 mg into the skin every 6 (six) months. 08/14/20   [name], MD  diclofenac  (VOLTAREN ) 75 MG EC tablet Take 1 tablet (75 mg total) by mouth 2 (two) times daily. For muscle and  Joint pain 01/12/24   [name], MD  diclofenac  Sodium (VOLTAREN ) 1 % GEL Apply 2 g topically 3 (three) times daily as needed. 01/01/24   Dettinger, Fonda LABOR, MD  diphenoxylate -atropine  (LOMOTIL ) 2.5-0.025 MG tablet TAKE 2 TABLETS BY MOUTH FOUR TIMES DAILY AS NEEDED FOR DIARRHEA OR LOOSE STOOLS 11/24/23   Stacks,  Butler, MD  ELIQUIS  5 MG TABS tablet TAKE 1 TABLET TWICE A DAY 11/02/23   Shlomo Wilbert SAUNDERS, MD  escitalopram  (LEXAPRO ) 10 MG tablet Take 10 mg by mouth daily.    [provider]  furosemide  (LASIX ) 40 MG tablet Take 40 mg by mouth daily. 09/18/23   [provider]  gabapentin  (NEURONTIN ) 100 MG capsule Take 1 capsule (100 mg total) by mouth 3 (three) times daily. 01/01/24   Dettinger, Fonda LABOR, MD  hydrochlorothiazide  (MICROZIDE ) 12.5 MG capsule Take 12.5 mg by mouth daily. 11/19/23   [provider]  hydrOXYzine  (VISTARIL ) 25 MG capsule Take 1 capsule (25 mg total) by mouth 3 (three)  times daily. 12/01/23   Zollie Butler, MD  insulin  glargine (LANTUS  SOLOSTAR) 100 UNIT/ML Solostar Pen Inject 30 Units into the skin 2 (two) times daily. 09/21/23   Shamleffer, Ibtehal Jaralla, MD  insulin  lispro (HUMALOG  KWIKPEN) 100 UNIT/ML KwikPen Max daily 45 units 05/22/23   Shamleffer, Ibtehal Jaralla, MD  Insulin  Pen Needle 29G X MISC 1 Device by Does not apply route daily in the afternoon. 05/22/23   Shamleffer, Ibtehal Jaralla, MD  losartan  (COZAAR ) 25 MG tablet Take 25 mg by mouth daily. 02/14/23   [provider]  MAGNESIUM  PO Take 1 tablet by mouth daily.    [provider]  meclizine  (ANTIVERT ) 25 MG tablet Take 1 tablet (25 mg total) by mouth 3 (three) times daily as needed for dizziness. 06/05/23   Dettinger, Fonda LABOR, MD  metFORMIN  (GLUCOPHAGE -XR) 500 MG 24 hr tablet Take 2 tablets (1,000 mg total) by mouth daily with breakfast. 05/22/23   Shamleffer, Donell Cardinal, MD  Methocarbamol  1000 MG TABS Take 1,000 mg by mouth every 6 (six) hours as needed. 08/31/23   Lovorn, Megan, MD  metoprolol  tartrate (LOPRESSOR ) 50 MG tablet TAKE 1 TABLET TWICE A DAY 12/09/23   Shlomo Wilbert SAUNDERS, MD  omeprazole  (PRILOSEC) 40 MG capsule Take 1 capsule (40 mg total) by mouth 2 (two) times daily before a meal. 10/23/23   Rudy Josette RAMAN, PA-C  ondansetron  (ZOFRAN ) 4 MG tablet Take 1 tablet (4 mg total) by mouth every 8 (eight) hours as needed for nausea or vomiting. For cancer related nausea 01/01/24   Dettinger, Fonda LABOR, MD  OXcarbazepine  (TRILEPTAL ) 150 MG tablet Take 1 tablet (150 mg total) by mouth 2 (two) times daily. 06/13/22   Sherrill Cable Latif, DO  oxyCODONE  (OXYCONTIN ) 20 mg 12 hr tablet Take 1 tablet (20 mg total) by mouth every 12 (twelve) hours. 10/29/23   Debby Fidela CROME, NP  oxyCODONE  (OXYCONTIN ) 20 mg 12 hr tablet Take 1 tablet (20 mg total) by mouth every 12 (twelve) hours. Due today for pain meds- chronic pain- G89.3 01/08/24   Lovorn, Megan, MD  oxyCODONE  (OXYCONTIN ) 20 mg 12  hr tablet Take 1 tablet (20 mg total) by mouth every 12 (twelve) hours. G89.3- chronic pain- fill 28 days after 8/25- Rx 01/08/24   Lovorn, Megan, MD  potassium chloride  (KLOR-CON ) 10 MEQ tablet Take 1 tablet (10 mEq total) by mouth daily. 02/05/23 01/14/24  Lucien Orren SAILOR, PA-C  rOPINIRole  (REQUIP ) 1 MG tablet Take 1 mg by mouth at bedtime.    [provider]  tirzepatide  (MOUNJARO ) 12.5 MG/0.5ML Pen Inject 12.5 mg into the skin once a week. 09/21/23   Shamleffer, Ibtehal Jaralla, MD  Vitamin D , Cholecalciferol , 25 MCG (1000 UT) TABS Take 1,000 Units by mouth in the morning. 07/26/20   [provider]  Current Facility-Administered Medications  Medication Dose Route Frequency Provider Last Rate Last Admin   0.9 %  sodium chloride  infusion   Intravenous Continuous Bryn Bernardino NOVAK, MD 75 mL/hr at 01/19/24 0917 New Bag at 01/19/24 0917   denosumab  (PROLIA ) injection 60 mg  60 mg Subcutaneous Q6 months [name], MD   60 mg at 01/15/24 1459   HYDROmorphone  (DILAUDID ) injection 0.5-1 mg  0.5-1 mg Intravenous Q3H PRN Bryn Bernardino NOVAK, MD   1 mg at 01/19/24 1101   Current Outpatient Medications  Medication Sig Dispense Refill   ALPRAZolam  (XANAX ) 1 MG tablet Take 1 mg by mouth at bedtime as needed for anxiety or sleep.     ANUCORT-HC  25 MG suppository Place 25 mg rectally daily.     ARIPiprazole  (ABILIFY ) 2 MG tablet Take 2 mg by mouth at bedtime.     atorvastatin  (LIPITOR) 80 MG tablet Take 1 tablet (80 mg total) by mouth daily. 90 tablet 3   carbidopa -levodopa  (SINEMET ) 10-100 MG tablet Take 1 tablet by mouth 4 (four) times daily. For restless leg syndrome 120 tablet 5   colchicine  0.6 MG tablet Take 1 tablet (0.6 mg total) by mouth 2 (two) times daily. For mouth sores 60 tablet 1   Continuous Glucose Sensor (DEXCOM G7 SENSOR) MISC CHANGE SENSOR EVERY 10 DAYS 9 each 3   cyanocobalamin  (VITAMIN B12) 1000 MCG tablet Take 1 tablet (1,000 mcg total) by mouth daily. 30 tablet 1   denosumab   (PROLIA ) 60 MG/ML SOSY injection Inject 60 mg into the skin every 6 (six) months. 1 mL 1   diclofenac  (VOLTAREN ) 75 MG EC tablet Take 1 tablet (75 mg total) by mouth 2 (two) times daily. For muscle and  Joint pain 60 tablet 2   diclofenac  Sodium (VOLTAREN ) 1 % GEL Apply 2 g topically 3 (three) times daily as needed. 20 g 1   diphenoxylate -atropine  (LOMOTIL ) 2.5-0.025 MG tablet TAKE 2 TABLETS BY MOUTH FOUR TIMES DAILY AS NEEDED FOR DIARRHEA OR LOOSE STOOLS 30 tablet 5   ELIQUIS  5 MG TABS tablet TAKE 1 TABLET TWICE A DAY 180 tablet 3   escitalopram  (LEXAPRO ) 10 MG tablet Take 10 mg by mouth daily.     furosemide  (LASIX ) 40 MG tablet Take 40 mg by mouth daily.     gabapentin  (NEURONTIN ) 100 MG capsule Take 1 capsule (100 mg total) by mouth 3 (three) times daily. 90 capsule 3   hydrochlorothiazide  (MICROZIDE ) 12.5 MG capsule Take 12.5 mg by mouth daily.     hydrOXYzine  (VISTARIL ) 25 MG capsule Take 1 capsule (25 mg total) by mouth 3 (three) times daily. 30 capsule 5   insulin  glargine (LANTUS  SOLOSTAR) 100 UNIT/ML Solostar Pen Inject 30 Units into the skin 2 (two) times daily. 60 mL 4   insulin  lispro (HUMALOG  KWIKPEN) 100 UNIT/ML KwikPen Max daily 45 units 45 mL 6   Insulin  Pen Needle 29G X MISC 1 Device by Does not apply route daily in the afternoon. 400 each 3   losartan  (COZAAR ) 25 MG tablet Take 25 mg by mouth daily.     MAGNESIUM  PO Take 1 tablet by mouth daily.     meclizine  (ANTIVERT ) 25 MG tablet Take 1 tablet (25 mg total) by mouth 3 (three) times daily as needed for dizziness. 60 tablet 1   metFORMIN  (GLUCOPHAGE -XR) 500 MG 24 hr tablet Take 2 tablets (1,000 mg total) by mouth daily with breakfast. 180 tablet 3   Methocarbamol  1000 MG TABS Take 1,000 mg by  mouth every 6 (six) hours as needed. 120 tablet 5   metoprolol  tartrate (LOPRESSOR ) 50 MG tablet TAKE 1 TABLET TWICE A DAY 180 tablet 3   omeprazole  (PRILOSEC) 40 MG capsule Take 1 capsule (40 mg total) by mouth 2 (two) times daily  before a meal. 60 capsule 3   ondansetron  (ZOFRAN ) 4 MG tablet Take 1 tablet (4 mg total) by mouth every 8 (eight) hours as needed for nausea or vomiting. For cancer related nausea 30 tablet 1   OXcarbazepine  (TRILEPTAL ) 150 MG tablet Take 1 tablet (150 mg total) by mouth 2 (two) times daily. 60 tablet 0   oxyCODONE  (OXYCONTIN ) 20 mg 12 hr tablet Take 1 tablet (20 mg total) by mouth every 12 (twelve) hours. 60 tablet 0   oxyCODONE  (OXYCONTIN ) 20 mg 12 hr tablet Take 1 tablet (20 mg total) by mouth every 12 (twelve) hours. Due today for pain meds- chronic pain- G89.3 60 tablet 0   oxyCODONE  (OXYCONTIN ) 20 mg 12 hr tablet Take 1 tablet (20 mg total) by mouth every 12 (twelve) hours. G89.3- chronic pain- fill 28 days after 8/25- Rx 60 tablet 0   potassium chloride  (KLOR-CON ) 10 MEQ tablet Take 1 tablet (10 mEq total) by mouth daily. 90 tablet 3   rOPINIRole  (REQUIP ) 1 MG tablet Take 1 mg by mouth at bedtime.     tirzepatide  (MOUNJARO ) 12.5 MG/0.5ML Pen Inject 12.5 mg into the skin once a week. 6 mL 3   Vitamin D , Cholecalciferol , 25 MCG (1000 UT) TABS Take 1,000 Units by mouth in the morning.     Facility-Administered Medications Ordered in Other Encounters  Medication Dose Route Frequency Provider Last Rate Last Admin   bupivacaine -epinephrine  (MARCAINE  W/ EPI) 0.5% -1:200000 injection    Anesthesia Intra-op Hollis, Kevin D, MD   12 mL at 01/21/19 0935    Allergies as of 01/19/2024 - Reviewed 01/19/2024  Allergen Reaction Noted   Iodinated contrast media Anaphylaxis, Swelling, and Other (See Comments) 12/13/2014   Iodine Anaphylaxis 06/28/2003   Latex Other (See Comments) 09/29/2018   Tape Other (See Comments) 01/28/2023   Tizanidine  Other (See Comments) 03/10/2022    Family History  Problem Relation Age of Onset   Diabetes Mother    Heart disease Mother    Alzheimer's disease Father    Heart disease Sister        CABG   Diabetes Sister    Alcohol  abuse Sister    Stroke Brother     Heart disease Brother    Mental illness Brother    Diabetes Brother    Breast cancer Neg Hx    Ovarian cancer Neg Hx    Colon cancer Neg Hx    Endometrial cancer Neg Hx     Social History   Socioeconomic History   Marital status: Married    Spouse name: Financial planner   Number of children: 3   Years of education: 15   Highest education level: Associate degree: academic program  Occupational History   Occupation: Retired    Comment: Chief Financial Officer  Tobacco Use   Smoking status: Never   Smokeless tobacco: Never  Vaping Use   Vaping status: Never Used  Substance and Sexual Activity   Alcohol  use: Not Currently    Alcohol /week: 0.0 standard drinks of alcohol    Drug use: No   Sexual activity: Not Currently    Partners: Male    Birth control/protection: Post-menopausal  Other Topics Concern   Not on file  Social History Narrative  Lives at home with husband.    They have 3 children who live away - [place] , [place] , and [place] .   She is from [place]  and most of her family lives there.   Her husbands's family live nearby   Right handed.   Social Drivers of Corporate investment banker Strain: Low Risk  (08/13/2023)   Overall Financial Resource Strain (CARDIA)    Difficulty of Paying Living Expenses: Not hard at all  Food Insecurity: No Food Insecurity (12/31/2023)   Hunger Vital Sign    Worried About Running Out of Food in the Last Year: Never true    Ran Out of Food in the Last Year: Never true  Transportation Needs: No Transportation Needs (12/31/2023)   PRAPARE - Administrator, Civil Service (Medical): No    Lack of Transportation (Non-Medical): No  Physical Activity: Insufficiently Active (08/13/2023)   Exercise Vital Sign    Days of Exercise per Week: 3 days    Minutes of Exercise per Session: 20 min  Stress: No Stress Concern Present (08/13/2023)   [employer] of Occupational Health - Occupational Stress Questionnaire    Feeling of Stress : Only a  little  Social Connections: Moderately Integrated (10/07/2023)   Social Connection and Isolation Panel    Frequency of Communication with Friends and Family: More than three times a week    Frequency of Social Gatherings with Friends and Family: Once a week    Attends Religious Services: More than 4 times per year    Active Member of [employer] or Organizations: No    Attends Banker Meetings: Never    Marital Status: Married  Catering manager Violence: Not At Risk (12/31/2023)   Humiliation, Afraid, Rape, and Kick questionnaire    Fear of Current or Ex-Partner: No    Emotionally Abused: No    Physically Abused: No    Sexually Abused: No    Review of Systems: Gen: Denies fever, chills, loss of appetite, change in weight or weight loss CV: Denies chest pain, heart palpitations, syncope, edema  Resp: Denies shortness of breath with rest, cough, wheezing GI: Denies dysphagia or odynophagia. Denies vomiting blood, jaundice, and fecal incontinence.  GU : Denies urinary burning, urinary frequency, urinary incontinence.  MS: Denies joint pain,swelling, cramping Derm: Denies rash, itching, dry skin Psych: Denies depression, anxiety,confusion, or memory loss Heme: Denies bruising, bleeding, and enlarged lymph nodes.  Physical Exam: Vital signs in last 24 hours: Temp:  [97.4 F (36.3 C)-97.6 F (36.4 C)] 97.6 F (36.4 C) (08/19 1022) Pulse Rate:  [67-86] 86 (08/19 1430) Resp:  [20] 20 (08/19 0530) BP: (127-179)/(47-97) 145/79 (08/19 1430) SpO2:  [90 %-98 %] 98 % (08/19 1430)   General:   Alert,  Well-developed, well-nourished, appears uncomfortable, moaning intermittently in pain.  Head:  Normocephalic and atraumatic. Eyes:  Sclera clear, no icterus.   Conjunctiva pink. Bruising around left eye.  Ears:  Normal auditory acuity. Lungs:  Clear throughout to auscultation.   No wheezes, crackles, or rhonchi. No acute distress. Heart:  Regular rate and rhythm; no murmurs, clicks,  rubs,  or gallops. Abdomen:  Soft, obese, generalized TTP, but worse in suprapubic and RLQ region.  No rebound or guarding.  Bowel sounds present. Rectal:  Deferred Msk:  Symmetrical without gross deformities. Normal posture. Pulses:  Normal pulses noted. Extremities:  Without edema. Bruising noted on right knee extending down to calf.  Neurologic:  Alert and  oriented x4;  grossly normal neurologically.  Psych: Normal mood and affect.  Intake/Output from previous day: No intake/output data recorded. Intake/Output this shift: Total I/O In: -  Out: 200 [Urine:200]  Lab Results: Recent Labs    01/19/24 0607 01/19/24 0749  WBC 5.9  --   HGB 7.6* 8.0*  HCT 24.0* 25.2*  PLT 160  --    BMET Recent Labs    01/19/24 0607 01/19/24 0749  NA 123* 127*  K 3.4* 4.4  CL 93* 95*  CO2 22 21*  GLUCOSE 106* 89  BUN 15 14  CREATININE 1.22* 1.08*  CALCIUM  7.4* 7.5*   LFT Recent Labs    01/19/24 0607  PROT 6.7  ALBUMIN  3.3*  AST 30  ALT 11  ALKPHOS 136*  BILITOT 0.8   C-Diff Recent Labs    01/19/24 0813  CDIFFTOX NEGATIVE    Studies/Results: CT ABDOMEN PELVIS WO CONTRAST Result Date: 01/19/2024 CLINICAL DATA:  Abdominal pain, acute, nonlocalized EXAM: CT ABDOMEN AND PELVIS WITHOUT CONTRAST TECHNIQUE: Multidetector CT imaging of the abdomen and pelvis was performed following the standard protocol without IV contrast. RADIATION DOSE REDUCTION: This exam was performed according to the departmental dose-optimization program which includes automated exposure control, adjustment of the mA and/or kV according to patient size and/or use of iterative reconstruction technique. COMPARISON:  CT chest, abdomen, and pelvis dated 01/12/2024. FINDINGS: Lower chest: Chronic bibasilar subpleural reticulation and ground-glass changes. Similar patchy ground-glass nodular densities, most pronounced in the visualized right upper lobe, concerning for multifocal infection, better evaluated on the  recent CT chest, abdomen common pelvis dated 01/12/2024. Cardiomegaly. Dense mitral calcification. Hepatobiliary: No suspicious focal hepatic lesion identified within the limits of an unenhanced exam. Gallbladder is unremarkable. No biliary dilatation. Pancreas: Unremarkable Spleen: Unremarkable Adrenals/Urinary Tract: Stable 1 cm left adrenal nodule, essentially unchanged dating back to 12/26/2019, most compatible with a benign etiology and favored to represent an adenoma. The right adrenal gland is unremarkable. No urolithiasis or hydronephrosis. Bladder is unremarkable. Stomach/Bowel: Stomach is within normal limits. Appendix appears normal. No obstruction or focal inflammatory changes. Air-fluid levels within the colon, as can be seen in the setting of diarrheal illness. Vascular/Lymphatic: Aortic atherosclerosis. No enlarged abdominal or pelvic lymph nodes. Reproductive: Uterus and bilateral adnexa are unremarkable. Other: No abdominopelvic ascites. No intraperitoneal free air. Fat containing right inguinal hernia. Similar infiltration of the subcutaneous soft tissues of the ventral abdominal pannus. No loculated fluid collection. Musculoskeletal: No acute osseous abnormality. No suspicious osseous lesion. Mild chronic T9, T11, and L2 compression deformities. Degenerative disc changes of the thoracolumbar spine. IMPRESSION: 1. Air-fluid levels within the colon, as can be seen in the setting of diarrheal illness. 2. Similar infiltration of the subcutaneous soft tissues of the ventral abdominal pannus. No loculated fluid collection. Cellulitis not excluded. 3. Chronic interstitial changes of the visualized lung bases with similar patchy ground-glass nodular densities, concerning for multifocal infection and better evaluated on the recent CT chest, abdomen and pelvis dated 01/12/2024. Aortic Atherosclerosis (ICD10-I70.0). Electronically Signed   By: Harrietta Sherry M.D.   On: 01/19/2024 12:55     Impression: 78 y.o. year old female with endometrioid endometrial cancer, medically inoperable, s/p IMRT, Afib on Eliquis , bipolar 1 disorder, CAD, CHF, chronic pain, DM, HTN, Herpes, NAFLD, OSA, osteopenia, RLS, pulmonary htn, adenomatous colon polyp, microscopic colitis, hospitalized in May 2025 with uncomplicated sigmoid diverticulitis, suspected persistent diverticulitis on repeat CT following discharge and treated with Cipro  and Flagyl ,  chronic intermittent rectal bleeding since Spetember 2023 and intermittent melena since late 2024  currently scheduled for outpatient EGD and colonoscopy 01/29/24, who presented to the emergency room today with complaint of diarrhea and abdominal pain, associated blood mixed in the stool. GI consulted due to worsening anemia with rectal bleeding.  Abdominal pain:  New onset abdominal pain today since presenting to the ER. Generalized, but worse in RLQ.  CT A/P without contrast (due to allergy) with no findings to explain severity of her abdominal pain.  To the air-fluid levels within the colon, similar infiltration of the subcutaneous soft tissue of the ventral abdominal pannus with no loculated fluid collection.  Etiology is unclear.  She does report need to urinate, but having difficulty with this, but I am not sure that this would explain the severity of her pain.  Recommend scanning her bladder, but will discuss further with Dr. Eartha.  Worsening anemia with rectal bleeding:  Hemoglobin has been consistently in the 10-11 range since October 2024, but has been declining since May 2025.  Hemoglobin was 7.6 on admission.  She has had ongoing intermittent rectal bleeding for well over a year now, but reports increased frequency of rectal bleeding dating back to late 2024 after completing radiation therapy for endometrial cancer.  Denies melena there had previously been some reports of this previously.  Denies NSAIDs, but diclofenac  on outpatient med list.  No EGD  on file.  Last colonoscopy in 2018 with tubular adenoma, hemorrhoids, microscopic colitis. She is currently scheduled for outpatient EGD and colonoscopy 01/29/2024, but will plan to complete this inpatient once we have ruled out  infectious etiology of her diarrhea, ensure no other underlying acute pathology contributing to her abdominal pain, have hyponatremia corrected, and Eliquis  has been held at least 48 hours.   Diarrhea: Acute onset watery diarrhea 3 days ago with too many bowel movements to count, nocturnal stools. Has rectal bleeding, but this has been a chronic issue even without diarrhea. Also with new onset abdominal pain today that wasn't present when diarrhea started as discussed above. She was on antibiotics recently for UTI, but C diff is negative. GI path panel is in process. She does have history of microscopic colitis which could be flaring. Ultimately if GI panel is negative, will plan for colonoscopy this admission.   Hyponatremia:  Na 123 on admission. Management per hospitalist.      Plan: Follow-up on GI path panel.  Will discuss abdominal pain with Dr. Eartha as this seems out of proportion to what has been found on CT and her other symptoms.  Needs colonoscopy and EGD once Na improved to >130, eliquis  held for 48 hours,  and underlying acute pathology contributing to her abdominal pain has been ruled out.  Recommend scanning patient's bladder. Spoke with hospitalist who will place this order.  Continue to monitor H/H and transfuse as necessary.  Monitor for overt GI bleeding.  Continue to hold Eliquis .  Management of hyponatremia per hospitliast.  Supportive measures with analgesics and antiemetics as needed.    LOS: 0 days    01/19/2024, 2:41 PM   [facility], [facility]

## 2024-01-19 NOTE — ED Notes (Addendum)
 Pt states she tried her 30 mL ofimodium last night without relief of loose stools

## 2024-01-19 NOTE — ED Notes (Signed)
 Per Hospitalist, Bryn, MD, if H & H redraw is stable, then pt could be admitted to Dept. 300

## 2024-01-19 NOTE — ED Notes (Signed)
 Called ICU to give report on patient and was put on hold. Awaiting to hear back from ICU now.

## 2024-01-19 NOTE — H&P (Signed)
 History and Physical    Patient: Amber Stephenson FMW:[id number] DOB: December 06, 1945 DOA: 01/19/2024 DOS: the patient was seen and examined on 01/19/2024 PCP: [name], MD  Patient coming from: Home  Chief Complaint:  Chief Complaint  Patient presents with   Diarrhea   HPI: Amber Stephenson is a 78 y.o. female with medical history significant of endometrial CA, AFib, CAD, chronic pain, T2DM who presented to the ED early this morning with persistent severe diarrhea and abdominal pain. She and her husband report many episodes of loose stools despite taking imodium  and lomotil  at home the past several days.  She's had trouble taking any food and has been pushing fluids at home, feeling more weak than usual and has had several falls since Sunday. She was recently treated with abx for UTI.   In the ED she was noted to be acutely anemic with +blood clots in stool. Admission requested, GI consulted.   Review of Systems: As mentioned in the history of present illness. All other systems reviewed and are negative. 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   Benign paroxysmal positional vertigo due to bilateral vestibular disorder 05/20/2019   Bipolar 1 disorder (HCC) 01/23/2015   with GAD, benzo dependence   CAD (coronary artery disease)    Nonobstructive; Managed by Dr. Charls   Cardiomegaly 01/12/2018   CHF (congestive heart failure) 06/08/2022   Chronic back pain 01/04/2015   Chronic constipation 04/25/2020   Chronic diastolic heart failure (HCC) 05/30/2015   Chronic pain syndrome 08/22/2019   back pain, sacroiliitis   Chronic post-traumatic stress disorder (PTSD) 12/06/2020   Diabetic neuropathy (HCC) 02/06/2016    Dyslipidemia 09/24/2020   Essential hypertension    Functional diarrhea 10/26/2020   Herpes genitalis in women 07/16/2015   History of adenomatous polyp of colon 05/21/2019   Overview:   03/31/17: Colonoscopy: nonadvanced adenoma, microscopic colitis, f/u 5 yrs, [facility]/GAP   History of radiation therapy    Endometrial- 02/18/23-03/31/23-Dr. Lynwood Kinard   Hypotension 09/01/2022   Insomnia 01/23/2015   Metabolic acidosis 01/12/2023   Migraine headache with aura 02/12/2016   Myofascial pain dysfunction syndrome 08/22/2019   Non-alcoholic fatty liver disease 01/12/2018   OSA (obstructive sleep apnea) 02/24/2019   10/09/2018 - HST  - AHI 40.6    Osteopenia 12/06/2020   Rx alendronate  35 mg.   Pinguecula 12/06/2020   Presbyopia 12/06/2020   Pulmonary hypertension    RLS (restless legs syndrome) 04/27/2015   RSV (respiratory syncytial virus infection) 06/10/2023   Tremor, essential 12/11/2021   Type II diabetes mellitus (HCC) 09/24/2020   Past Surgical History:  Procedure Laterality Date   BREAST REDUCTION SURGERY     COLONOSCOPY  2018   6 mm cecal tubular adenoma, sigmoid diverticulosis, random colon biopsies consistent with microscopic colitis   EYE SURGERY Right    cateracts   HAMMER TOE SURGERY     LEFT HEART CATH AND CORONARY ANGIOGRAPHY N/A 02/03/2018   Procedure: LEFT HEART CATH AND CORONARY ANGIOGRAPHY;  Surgeon: [name], Peter M, MD;  Location: [facility] INVASIVE CV LAB;  Service: Cardiovascular;  Laterality: N/A;   REVERSE SHOULDER ARTHROPLASTY Right 08/19/2019   Procedure: REVERSE SHOULDER ARTHROPLASTY;  Surgeon: Kay Kemps, MD;  Location: [facility] ORS;  Service: Orthopedics;  Laterality: Right;  interscalene block   SHOULDER SURGERY Right    I BROKE MY SHOUDLER   THIGH SURGERY     TO REMOVE A TUMOR    Social History:  reports that she has never smoked. She has never used smokeless tobacco. She reports that she does not currently use alcohol . She reports that she does not use  drugs.  Allergies  Allergen Reactions   Iodinated Contrast Media Anaphylaxis, Swelling and Other (See Comments)    Throat closes, swelling of throat and tongue   Iodine Anaphylaxis   Latex Other (See Comments)    Latex tape pulls skin with it   Tape Other (See Comments)    Pulls off the skin, if latex   Tizanidine  Other (See Comments)    Weakness, goofy, bad dreams    Family History  Problem Relation Age of Onset   Diabetes Mother    Heart disease Mother    Alzheimer's disease Father    Heart disease Sister        CABG   Diabetes Sister    Alcohol  abuse Sister    Stroke Brother    Heart disease Brother    Mental illness Brother    Diabetes Brother    Breast cancer Neg Hx    Ovarian cancer Neg Hx    Colon cancer Neg Hx    Endometrial cancer Neg Hx     Prior to Admission medications   Medication Sig Start Date End Date Taking? Authorizing Provider  ALPRAZolam  (XANAX ) 1 MG tablet Take 1 mg by mouth at bedtime as needed for anxiety or sleep. 12/21/23   [provider]  ANUCORT-HC  25 MG suppository Place 25 mg rectally daily. 09/28/23   [provider]  ARIPiprazole  (ABILIFY ) 2 MG tablet Take 2 mg by mouth at bedtime. 10/07/21   [provider]  atorvastatin  (LIPITOR) 80 MG tablet Take 1 tablet (80 mg total) by mouth daily. 02/05/23 01/14/24  Conte, Tessa N, PA-C  carbidopa -levodopa  (SINEMET ) 10-100 MG tablet Take 1 tablet by mouth 4 (four) times daily. For restless leg syndrome 12/01/23   [name], MD  colchicine  0.6 MG tablet Take 1 tablet (0.6 mg total) by mouth 2 (two) times daily. For mouth sores 01/07/24   [name], MD  Continuous Glucose Sensor (DEXCOM G7 SENSOR) MISC CHANGE SENSOR EVERY 10 DAYS 09/28/23   Shamleffer, Donell Cardinal, MD  cyanocobalamin  (VITAMIN B12) 1000 MCG tablet Take 1 tablet (1,000 mcg total) by mouth daily. 09/04/22   Ghimire, Donalda HERO, MD  denosumab  (PROLIA ) 60 MG/ML SOSY injection Inject 60 mg into the skin every 6 (six)  months. 08/14/20   [name], MD  diclofenac  (VOLTAREN ) 75 MG EC tablet Take 1 tablet (75 mg total) by mouth 2 (two) times daily. For muscle and  Joint pain 01/12/24   [name], MD  diclofenac  Sodium (VOLTAREN ) 1 % GEL Apply 2 g topically 3 (three) times daily as needed. 01/01/24   Dettinger, Fonda LABOR, MD  diphenoxylate -atropine  (LOMOTIL ) 2.5-0.025 MG tablet TAKE 2 TABLETS BY MOUTH FOUR TIMES DAILY AS NEEDED FOR DIARRHEA OR LOOSE STOOLS 11/24/23   [name], MD  ELIQUIS  5 MG TABS tablet TAKE 1 TABLET TWICE A DAY 11/02/23   Shlomo Wilbert SAUNDERS, MD  escitalopram  (LEXAPRO ) 10 MG tablet Take 10 mg by mouth daily.    [provider]  furosemide  (LASIX ) 40 MG tablet Take 40 mg by mouth daily. 09/18/23   [provider]  gabapentin  (NEURONTIN ) 100 MG  capsule Take 1 capsule (100 mg total) by mouth 3 (three) times daily. 01/01/24   Dettinger, Fonda LABOR, MD  hydrochlorothiazide  (MICROZIDE ) 12.5 MG capsule Take 12.5 mg by mouth daily. 11/19/23   [provider]  hydrOXYzine  (VISTARIL ) 25 MG capsule Take 1 capsule (25 mg total) by mouth 3 (three) times daily. 12/01/23   [name], MD  insulin  glargine (LANTUS  SOLOSTAR) 100 UNIT/ML Solostar Pen Inject 30 Units into the skin 2 (two) times daily. 09/21/23   Shamleffer, Ibtehal Jaralla, MD  insulin  lispro (HUMALOG  KWIKPEN) 100 UNIT/ML KwikPen Max daily 45 units 05/22/23   Shamleffer, Ibtehal Jaralla, MD  Insulin  Pen Needle 29G X MISC 1 Device by Does not apply route daily in the afternoon. 05/22/23   Shamleffer, Ibtehal Jaralla, MD  losartan  (COZAAR ) 25 MG tablet Take 25 mg by mouth daily. 02/14/23   [provider]  MAGNESIUM  PO Take 1 tablet by mouth daily.    [provider]  meclizine  (ANTIVERT ) 25 MG tablet Take 1 tablet (25 mg total) by mouth 3 (three) times daily as needed for dizziness. 06/05/23   Dettinger, Fonda LABOR, MD  metFORMIN  (GLUCOPHAGE -XR) 500 MG 24 hr tablet Take 2 tablets (1,000 mg total) by mouth  daily with breakfast. 05/22/23   Shamleffer, Donell Cardinal, MD  Methocarbamol  1000 MG TABS Take 1,000 mg by mouth every 6 (six) hours as needed. 08/31/23   Lovorn, Megan, MD  metoprolol  tartrate (LOPRESSOR ) 50 MG tablet TAKE 1 TABLET TWICE A DAY 12/09/23   Shlomo Wilbert SAUNDERS, MD  omeprazole  (PRILOSEC) 40 MG capsule Take 1 capsule (40 mg total) by mouth 2 (two) times daily before a meal. 10/23/23   Rudy Josette RAMAN, PA-C  ondansetron  (ZOFRAN ) 4 MG tablet Take 1 tablet (4 mg total) by mouth every 8 (eight) hours as needed for nausea or vomiting. For cancer related nausea 01/01/24   Dettinger, Fonda LABOR, MD  OXcarbazepine  (TRILEPTAL ) 150 MG tablet Take 1 tablet (150 mg total) by mouth 2 (two) times daily. 06/13/22   Sherrill Cable Latif, DO  oxyCODONE  (OXYCONTIN ) 20 mg 12 hr tablet Take 1 tablet (20 mg total) by mouth every 12 (twelve) hours. 10/29/23   Debby Fidela CROME, NP  oxyCODONE  (OXYCONTIN ) 20 mg 12 hr tablet Take 1 tablet (20 mg total) by mouth every 12 (twelve) hours. Due today for pain meds- chronic pain- G89.3 01/08/24   Lovorn, Megan, MD  oxyCODONE  (OXYCONTIN ) 20 mg 12 hr tablet Take 1 tablet (20 mg total) by mouth every 12 (twelve) hours. G89.3- chronic pain- fill 28 days after 8/25- Rx 01/08/24   Lovorn, Megan, MD  potassium chloride  (KLOR-CON ) 10 MEQ tablet Take 1 tablet (10 mEq total) by mouth daily. 02/05/23 01/14/24  Lucien Orren SAILOR, PA-C  rOPINIRole  (REQUIP ) 1 MG tablet Take 1 mg by mouth at bedtime.    [provider]  tirzepatide  (MOUNJARO ) 12.5 MG/0.5ML Pen Inject 12.5 mg into the skin once a week. 09/21/23   Shamleffer, Donell Cardinal, MD  Vitamin D , Cholecalciferol , 25 MCG (1000 UT) TABS Take 1,000 Units by mouth in the morning. 07/26/20   [provider]    Physical Exam: Vitals:   01/19/24 0530  BP: (!) 156/89  Pulse: 75  Resp: 20  Temp: (!) 97.4 F (36.3 C)  TempSrc: Oral  SpO2: 97%  Gen: Frail appearing female in no distress Pulm: Clear, nonlabored  CV: RRR, no MRG,  no pitting edema GI: Soft, protuberant, diffusely tender, no rebound, +BS Neuro: Alert and oriented.  No new focal deficits. Ext: Warm, no deformities. Skin: Periorbital ecchymosis noted, RLE prepatellar ecchymosis and dependent medial ecchymosis without fluctuance. No other rashes, lesions or ulcers on visualized skin   Data Reviewed: Na 123 K 3.4 Cr 1.22 Albumin  3.3 Alk Phos 136 (stable), other LFTs wnl WBC 5.9k Hgb 7.6g/dl with normal indices, plt's 160k.   Assessment and Plan: ABLA due to lower GI bleeding: Hx diverticulosis and diverticulitis (admitted May 2025).  - T&S stat, serial H/H - GI consult, pt of Dr. Ivonne. Castaneda on call. Keep NPO - Hold anticoagulation  Diarrhea: Acutely worsened more chronic problem - Continuing PPI, while awaiting GI studies (C. diff and GI pathogen panel), will hold imodium , lomotil  - Check CT (contrast allergy limits sensitivity) with tenderness, distention, pain.   AKI, hyponatremia:  - Given 1L NS in ED. Recheck to see where we are and decide on IVF composition/rate at that point. Will continue IVF.  - Check urine with micro, urine sodium, Pr, Cr, Osm.  - Med list contains lasix  and HCTZ, likely should avoid duplicate diuretic Tx. Will not continue thiazide going forward and hold both now.  - Hold ARB.   AFib:  - Hold anticoagulation - Continue metoprolol   Endometrial CA: s/p IMRT, boost Tx:  - Follow up with Dr. Shannon  Chronic HFpEF, HTN: - BP elevated despite GI bleeding.  - Hold lasix  40mg  daily in setting of AKI and NPO status. Holding thiazide and ARB as well with AKI.  Chronic pain:  - Continue oxycontin  20mg  BID - Hold NSAID for now, though BUN is wnl.  - Continue gabapentin   -Give dilaudid  IV prn for now  Falls, weakness:  - PT/OT in addition to management as outlined above.   CAD:  - Continue BB, statin, hold anticoagulation (not on ASA).   IDT2DM: HbA1c 6.7%.  - SSI, moderate scale - Given euglycemia  currently, NPO status, will hold basal insulin  but low threshold to restart - Hold home metformin , mounjaro  (BMI 32).   Anxiety:  - Continue abilify , escitalopram , oxcarbazepine , xanax  - hydroxyzine   RLS:  - Continue requip  - Continue sinemet   Chronic hypoxic respiratory failure:  - Continue O2 prn   Advance Care Planning: Full code  Consults: GI  Family Communication: Husband at bedside  Severity of Illness: The appropriate patient status for this patient is INPATIENT. Inpatient status is judged to be reasonable and necessary in order to provide the required intensity of service to ensure the patient's safety. The patient's presenting symptoms, physical exam findings, and initial radiographic and laboratory data in the context of their chronic comorbidities is felt to place them at high risk for further clinical deterioration. Furthermore, it is not anticipated that the patient will be medically stable for discharge from the hospital within 2 midnights of admission.   * I certify that at the point of admission it is my clinical judgment that the patient will require inpatient hospital care spanning beyond 2 midnights from the point of admission due to high intensity of service, high risk for further deterioration and high frequency of surveillance required.*  Author: [name], MD 01/19/2024 7:27 AM  For on call review [email or web address].

## 2024-01-19 NOTE — Plan of Care (Signed)
   Problem: Education: Goal: Knowledge of General Education information will improve Description Including pain rating scale, medication(s)/side effects and non-pharmacologic comfort measures Outcome: Progressing   Problem: Education: Goal: Knowledge of General Education information will improve Description Including pain rating scale, medication(s)/side effects and non-pharmacologic comfort measures Outcome: Progressing

## 2024-01-19 NOTE — ED Notes (Signed)
 Changed pt with help into near diaper and changed out bed items. CT notified that pt would be ready when they are available.

## 2024-01-19 NOTE — ED Notes (Signed)
 Changed pt with help, cleaned and put on new diaper.

## 2024-01-19 NOTE — ED Provider Notes (Signed)
 [place] EMERGENCY DEPARTMENT AT [facility] Provider Note   CSN: [id number] Arrival date & time: 01/19/24  9488     Patient presents with: Diarrhea   Amber Stephenson is a 78 y.o. female.   The history is provided by the patient and the spouse.  Patient with extensive history including atrial fibrillation on anticoagulation, CAD, chronic pain managed by oxycodone , diabetes, recurrent episodes of abdominal pain, recurrent falls Presents for diarrhea and abdominal pain.  Patient reports over the past 3 days she has had increasing episodes of diarrhea and now having blood mixed in. No fevers or vomiting.  She is also reporting diffuse abdominal pain.  Fortunately she has been able to continue to take her pain medicines. She has been using Imodium  without relief She reports she is scheduled to see gastroenterology later today.  Patient has had multiple falls recently and has been already been evaluated and has had multiple imaging modalities.  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   Benign paroxysmal positional vertigo due to bilateral vestibular disorder 05/20/2019   Bipolar 1 disorder (HCC) 01/23/2015   with GAD, benzo dependence   CAD (coronary artery disease)    Nonobstructive; Managed by Dr. Charls   Cardiomegaly 01/12/2018   CHF (congestive heart failure) 06/08/2022   Chronic back pain 01/04/2015   Chronic constipation 04/25/2020   Chronic diastolic heart failure (HCC) 05/30/2015   Chronic pain syndrome 08/22/2019   back pain, sacroiliitis   Chronic post-traumatic stress disorder (PTSD)  12/06/2020   Diabetic neuropathy (HCC) 02/06/2016   Dyslipidemia 09/24/2020   Essential hypertension    Functional diarrhea 10/26/2020   Herpes genitalis in women 07/16/2015   History of adenomatous polyp of colon 05/21/2019   Overview:   03/31/17: Colonoscopy: nonadvanced adenoma, microscopic colitis, f/u 5 yrs, [facility]/GAP   History of radiation therapy    Endometrial- 02/18/23-03/31/23-Dr. Lynwood Kinard   Hypotension 09/01/2022   Insomnia 01/23/2015   Metabolic acidosis 01/12/2023   Migraine headache with aura 02/12/2016   Myofascial pain dysfunction syndrome 08/22/2019   Non-alcoholic fatty liver disease 01/12/2018   OSA (obstructive sleep apnea) 02/24/2019   10/09/2018 - HST  - AHI 40.6    Osteopenia 12/06/2020   Rx alendronate  35 mg.   Pinguecula 12/06/2020   Presbyopia 12/06/2020   Pulmonary hypertension    RLS (restless legs syndrome) 04/27/2015   RSV (respiratory syncytial virus infection) 06/10/2023   Tremor, essential 12/11/2021   Type II diabetes mellitus (HCC) 09/24/2020    Prior to Admission medications   Medication Sig Start Date End Date Taking? Authorizing Provider  ALPRAZolam  (XANAX ) 1 MG tablet Take 1 mg by mouth at bedtime as needed for anxiety or sleep. 12/21/23   [provider]  ANUCORT-HC  25 MG suppository Place 25 mg rectally daily. 09/28/23   [provider]  ARIPiprazole  (ABILIFY ) 2 MG tablet Take 2 mg by mouth at bedtime. 10/07/21   [provider]  atorvastatin  (LIPITOR) 80 MG tablet Take 1 tablet (80 mg total) by mouth daily. 02/05/23 01/14/24  Lucien Orren SAILOR, PA-C  carbidopa -levodopa  (SINEMET ) 10-100 MG tablet Take 1 tablet by mouth 4 (four) times daily. For restless  leg syndrome 12/01/23   [name], MD  colchicine  0.6 MG tablet Take 1 tablet (0.6 mg total) by mouth 2 (two) times daily. For mouth sores 01/07/24   [name], MD  Continuous Glucose Sensor (DEXCOM G7 SENSOR) MISC CHANGE SENSOR EVERY 10 DAYS 09/28/23   Shamleffer,  Donell Cardinal, MD  cyanocobalamin  (VITAMIN B12) 1000 MCG tablet Take 1 tablet (1,000 mcg total) by mouth daily. 09/04/22   Ghimire, Donalda HERO, MD  denosumab  (PROLIA ) 60 MG/ML SOSY injection Inject 60 mg into the skin every 6 (six) months. 08/14/20   [name], MD  diclofenac  (VOLTAREN ) 75 MG EC tablet Take 1 tablet (75 mg total) by mouth 2 (two) times daily. For muscle and  Joint pain 01/12/24   [name], MD  diclofenac  Sodium (VOLTAREN ) 1 % GEL Apply 2 g topically 3 (three) times daily as needed. 01/01/24   Dettinger, Fonda LABOR, MD  diphenoxylate -atropine  (LOMOTIL ) 2.5-0.025 MG tablet TAKE 2 TABLETS BY MOUTH FOUR TIMES DAILY AS NEEDED FOR DIARRHEA OR LOOSE STOOLS 11/24/23   [name], MD  ELIQUIS  5 MG TABS tablet TAKE 1 TABLET TWICE A DAY 11/02/23   Turner, Wilbert SAUNDERS, MD  escitalopram  (LEXAPRO ) 10 MG tablet Take 10 mg by mouth daily.    [provider]  furosemide  (LASIX ) 40 MG tablet Take 40 mg by mouth daily. 09/18/23   [provider]  gabapentin  (NEURONTIN ) 100 MG capsule Take 1 capsule (100 mg total) by mouth 3 (three) times daily. 01/01/24   Dettinger, Fonda LABOR, MD  hydrochlorothiazide  (MICROZIDE ) 12.5 MG capsule Take 12.5 mg by mouth daily. 11/19/23   [provider]  hydrOXYzine  (VISTARIL ) 25 MG capsule Take 1 capsule (25 mg total) by mouth 3 (three) times daily. 12/01/23   [name], MD  insulin  glargine (LANTUS  SOLOSTAR) 100 UNIT/ML Solostar Pen Inject 30 Units into the skin 2 (two) times daily. 09/21/23   Shamleffer, Ibtehal Jaralla, MD  insulin  lispro (HUMALOG  KWIKPEN) 100 UNIT/ML KwikPen Max daily 45 units 05/22/23   Shamleffer, Ibtehal Jaralla, MD  Insulin  Pen Needle 29G X MISC 1 Device by Does not apply route daily in the afternoon. 05/22/23   Shamleffer, Ibtehal Jaralla, MD  losartan  (COZAAR ) 25 MG tablet Take 25 mg by mouth daily. 02/14/23   [provider]  MAGNESIUM  PO Take 1 tablet by mouth daily.    [provider]  meclizine   (ANTIVERT ) 25 MG tablet Take 1 tablet (25 mg total) by mouth 3 (three) times daily as needed for dizziness. 06/05/23   Dettinger, Fonda LABOR, MD  metFORMIN  (GLUCOPHAGE -XR) 500 MG 24 hr tablet Take 2 tablets (1,000 mg total) by mouth daily with breakfast. 05/22/23   Shamleffer, Donell Cardinal, MD  Methocarbamol  1000 MG TABS Take 1,000 mg by mouth every 6 (six) hours as needed. 08/31/23   Lovorn, Megan, MD  metoprolol  tartrate (LOPRESSOR ) 50 MG tablet TAKE 1 TABLET TWICE A DAY 12/09/23   Shlomo Wilbert SAUNDERS, MD  omeprazole  (PRILOSEC) 40 MG capsule Take 1 capsule (40 mg total) by mouth 2 (two) times daily before a meal. 10/23/23   Rudy Josette RAMAN, PA-C  ondansetron  (ZOFRAN ) 4 MG tablet Take 1 tablet (4 mg total) by mouth every 8 (eight) hours as needed for nausea or vomiting. For cancer related nausea 01/01/24   Dettinger, Fonda LABOR, MD  OXcarbazepine  (TRILEPTAL ) 150 MG tablet Take 1 tablet (150 mg total) by mouth 2 (two) times daily. 06/13/22   Sherrill Cable Latif, DO  oxyCODONE  (OXYCONTIN ) 20 mg  12 hr tablet Take 1 tablet (20 mg total) by mouth every 12 (twelve) hours. 10/29/23   Debby Fidela CROME, NP  oxyCODONE  (OXYCONTIN ) 20 mg 12 hr tablet Take 1 tablet (20 mg total) by mouth every 12 (twelve) hours. Due today for pain meds- chronic pain- G89.3 01/08/24   Lovorn, Megan, MD  oxyCODONE  (OXYCONTIN ) 20 mg 12 hr tablet Take 1 tablet (20 mg total) by mouth every 12 (twelve) hours. G89.3- chronic pain- fill 28 days after 8/25- Rx 01/08/24   Lovorn, Megan, MD  potassium chloride  (KLOR-CON ) 10 MEQ tablet Take 1 tablet (10 mEq total) by mouth daily. 02/05/23 01/14/24  Lucien Orren SAILOR, PA-C  rOPINIRole  (REQUIP ) 1 MG tablet Take 1 mg by mouth at bedtime.    [provider]  tirzepatide  (MOUNJARO ) 12.5 MG/0.5ML Pen Inject 12.5 mg into the skin once a week. 09/21/23   Shamleffer, Donell Cardinal, MD  Vitamin D , Cholecalciferol , 25 MCG (1000 UT) TABS Take 1,000 Units by mouth in the morning. 07/26/20   [provider]     Allergies: Iodinated contrast media, Iodine, Latex, Tape, and Tizanidine     Review of Systems  Constitutional:  Negative for fever.  Gastrointestinal:  Positive for abdominal pain, blood in stool and diarrhea. Negative for vomiting.    Updated Vital Signs BP (!) 156/89 (BP Location: Left Arm)   Pulse 75   Temp (!) 97.4 F (36.3 C) (Oral)   Resp 20   SpO2 97%   Physical Exam CONSTITUTIONAL: Elderly and chronically ill-appearing HEAD: Normocephalic/atraumatic EYES: Bruising noted around the left orbit No icterus ENMT: Mucous membranes moist NECK: supple no meningeal signs CV: S1/S2 noted, no murmurs/rubs/gallops noted LUNGS:no apparent distress ABDOMEN: soft, obese, diffuse tenderness, bowel sounds noted, no rebound Rectal -formed stool noted in the diaper that is brown without melena. Blood noted in diaper. Hemoccult positive per nursing staff NEURO: Pt is awake/alert/appropriate, moves all extremitiesx4.  No facial droop.   EXTREMITIES: pulses normal/equal, full ROM Bruising noted to right knee SKIN: warm, color normal PSYCH: Anxious   (all labs ordered are listed, but only abnormal results are displayed) Labs Reviewed  CBC WITH DIFFERENTIAL/PLATELET - Abnormal; Notable for the following components:      Result Value   RBC 2.86 (*)    Hemoglobin 7.6 (*)    HCT 24.0 (*)    RDW 17.7 (*)    nRBC 0.3 (*)    Lymphs Abs 0.5 (*)    All other components within normal limits  COMPREHENSIVE METABOLIC PANEL WITH GFR - Abnormal; Notable for the following components:   Sodium 123 (*)    Potassium 3.4 (*)    Chloride 93 (*)    Glucose, Bld 106 (*)    Creatinine, Ser 1.22 (*)    Calcium  7.4 (*)    Albumin  3.3 (*)    Alkaline Phosphatase 136 (*)    GFR, Estimated 46 (*)    All other components within normal limits  LIPASE, BLOOD  POC OCCULT BLOOD, ED  TYPE AND SCREEN    EKG: None  Radiology: No results found.   Procedures   Medications Ordered in the ED   fentaNYL  (SUBLIMAZE ) injection 50 mcg (has no administration in time range)  sodium chloride  0.9 % bolus 1,000 mL (1,000 mLs Intravenous New Bag/Given 01/19/24 0626)  fentaNYL  (SUBLIMAZE ) injection 100 mcg (100 mcg Intravenous Given 01/19/24 0626)    Clinical Course as of 01/19/24 0710  Tue Jan 19, 2024  0554 Patient presents for her 10th  ER visit in 6 months.  Patient frequently presents for falls as well as abdominal pain and diarrhea.  Patient with extensive workup recently including up to 7 CT abdomen pelvis scans since May Clinically patient appears uncomfortable but is in no acute distress. She reported rectal bleeding, but her stool currently was soft formed and brown without gross blood or melena  Will check her labs and reassess Will defer imaging for now [DW]  0633 Hemoglobin(!): 7.6 Stable anemia [DW]  0648 Sodium(!): 123 Acute on chronic hyponatremia [DW]  0648 Creatinine(!): 1.22 Renal insufficiency [DW]  9349 Patient reports pain is somewhat improved.  This pain is similar to prior episodes, will defer CT imaging for now [DW]  0651 After further review of her chart, patient had significant drop in her hemoglobin over the past several months.  She is now below 8 which appears to be new for her.  She does not recall ever having a blood transfusion previously.  Patient has continued to be prescribed anticoagulation.  She also reports tonight she did have blood clots in her stool.  Patient is high risk for deterioration from GI bleed given the use of anticoagulation and hemoglobin less than 8.  Plan will be to admit to the hospital for monitoring. [DW]  0710 Signed out to Dr Franklyn at shift change to call report [DW]    Clinical Course User Index [DW] Midge Golas, MD                                 Medical Decision Making Amount and/or Complexity of Data Reviewed Labs: ordered. Decision-making details documented in ED Course.  Risk Prescription drug  management. Decision regarding hospitalization.   This patient presents to the ED for concern of abdominal pain and diarrhea, this involves an extensive number of treatment options, and is a complaint that carries with it a high risk of complications and morbidity.  The differential diagnosis includes but is not limited to cholecystitis, cholelithiasis, pancreatitis, gastritis, peptic ulcer disease, appendicitis, bowel obstruction, bowel perforation, diverticulitis, AAA, ischemic bowel, enteritis, C. difficile colitis  Comorbidities that complicate the patient evaluation: Patient's presentation is complicated by their history of chronic pain, diabetes  Social Determinants of Health: Patient's chronic pain and multiple ER evaluations  increases the complexity of managing their presentation  Additional history obtained: Additional history obtained from spouse Records reviewed Primary Care Documents  Lab Tests: I Ordered, and personally interpreted labs.  The pertinent results include: Anemia, acute on chronic hyponatremia, renal insufficiency  Medicines ordered and prescription drug management: I ordered medication including fentanyl  for pain Reevaluation of the patient after these medicines showed that the patient    improved  Test Considered: CT abdomen pelvis for abdominal pain was considered, but since patient is had this pain previously, with multiple CT imaging studies over the past several months will defer at this time  Critical Interventions:   admission and monitoring of her hemoglobin  Reevaluation: After the interventions noted above, I reevaluated the patient and found that they have :improved  Complexity of problems addressed: Patient's presentation is most consistent with  acute presentation with potential threat to life or bodily function  Disposition: After consideration of the diagnostic results and the patient's response to treatment,  I feel that the patent  would benefit from admission  .        Final diagnoses:  Diarrhea, unspecified type  Acute anemia  Hyponatremia  ED Discharge Orders     None          Midge Golas, MD 01/19/24 [phone]

## 2024-01-19 NOTE — ED Notes (Signed)
 POC occult blood positive--EDP made aware

## 2024-01-20 DIAGNOSIS — K7689 Other specified diseases of liver: Secondary | ICD-10-CM

## 2024-01-20 DIAGNOSIS — R197 Diarrhea, unspecified: Secondary | ICD-10-CM | POA: Diagnosis not present

## 2024-01-20 DIAGNOSIS — R1084 Generalized abdominal pain: Secondary | ICD-10-CM | POA: Diagnosis not present

## 2024-01-20 DIAGNOSIS — I517 Cardiomegaly: Secondary | ICD-10-CM

## 2024-01-20 DIAGNOSIS — D649 Anemia, unspecified: Secondary | ICD-10-CM | POA: Diagnosis not present

## 2024-01-20 DIAGNOSIS — D62 Acute posthemorrhagic anemia: Secondary | ICD-10-CM | POA: Diagnosis not present

## 2024-01-20 DIAGNOSIS — K5730 Diverticulosis of large intestine without perforation or abscess without bleeding: Secondary | ICD-10-CM

## 2024-01-20 DIAGNOSIS — K625 Hemorrhage of anus and rectum: Secondary | ICD-10-CM

## 2024-01-20 DIAGNOSIS — E871 Hypo-osmolality and hyponatremia: Secondary | ICD-10-CM

## 2024-01-20 DIAGNOSIS — Z8542 Personal history of malignant neoplasm of other parts of uterus: Secondary | ICD-10-CM

## 2024-01-20 DIAGNOSIS — Z860101 Personal history of adenomatous and serrated colon polyps: Secondary | ICD-10-CM

## 2024-01-20 LAB — GLUCOSE, CAPILLARY
Glucose-Capillary: 102 mg/dL — ABNORMAL HIGH (ref 70–99)
Glucose-Capillary: 104 mg/dL — ABNORMAL HIGH (ref 70–99)
Glucose-Capillary: 90 mg/dL (ref 70–99)
Glucose-Capillary: 103 mg/dL — ABNORMAL HIGH (ref 70–99)
Glucose-Capillary: 114 mg/dL — ABNORMAL HIGH (ref 70–99)
Glucose-Capillary: 121 mg/dL — ABNORMAL HIGH (ref 70–99)

## 2024-01-20 LAB — COMPREHENSIVE METABOLIC PANEL WITH GFR
ALT: <5 U/L (ref 0–44)
AST: 26 U/L (ref 15–41)
Albumin: 3.1 g/dL — ABNORMAL LOW (ref 3.5–5.0)
Alkaline Phosphatase: 138 U/L — ABNORMAL HIGH (ref 38–126)
Anion gap: 7 (ref 5–15)
BUN: 12 mg/dL (ref 8–23)
CO2: 21 mmol/L — ABNORMAL LOW (ref 22–32)
Calcium: 7.2 mg/dL — ABNORMAL LOW (ref 8.9–10.3)
Chloride: 99 mmol/L (ref 98–111)
Creatinine, Ser: 0.77 mg/dL (ref 0.44–1.00)
GFR, Estimated: >60 mL/min (ref >60)
Glucose, Bld: 85 mg/dL (ref 70–99)
Potassium: 3.9 mmol/L (ref 3.5–5.1)
Sodium: 127 mmol/L — ABNORMAL LOW (ref 135–145)
Total Bilirubin: 1.1 mg/dL (ref 0.0–1.2)
Total Protein: 6.1 g/dL — ABNORMAL LOW (ref 6.5–8.1)

## 2024-01-20 LAB — CBC
HCT: 24 % — ABNORMAL LOW (ref 36.0–46.0)
Hemoglobin: 7.8 g/dL — ABNORMAL LOW (ref 12.0–15.0)
MCH: 27.1 pg (ref 26.0–34.0)
MCHC: 32.5 g/dL (ref 30.0–36.0)
MCV: 83.3 fL (ref 80.0–100.0)
Platelets: 171 K/uL (ref 150–400)
RBC: 2.88 MIL/uL — ABNORMAL LOW (ref 3.87–5.11)
RDW: 17.9 % — ABNORMAL HIGH (ref 11.5–15.5)
WBC: 4.8 K/uL (ref 4.0–10.5)
nRBC: 0 % (ref 0.0–0.2)

## 2024-01-20 LAB — SODIUM, URINE, RANDOM: Sodium, Ur: 30 mmol/L

## 2024-01-20 LAB — PROTIME-INR
INR: 1.8 — ABNORMAL HIGH (ref 0.8–1.2)
Prothrombin Time: 21.4 s — ABNORMAL HIGH (ref 11.4–15.2)

## 2024-01-20 MED ORDER — SODIUM CHLORIDE 1 G PO TABS
1.0000 g | ORAL_TABLET | Freq: Three times a day (TID) | ORAL | Status: DC
  Administered 2024-01-20 – 2024-01-21 (×3): 1 g via ORAL
  Filled 2024-01-20 (×3): qty 1

## 2024-01-20 MED ORDER — LOPERAMIDE HCL 2 MG PO CAPS
4.0000 mg | ORAL_CAPSULE | Freq: Once | ORAL | Status: AC
  Administered 2024-01-20: 4 mg via ORAL
  Filled 2024-01-20: qty 2

## 2024-01-20 MED ORDER — MAGIC MOUTHWASH W/LIDOCAINE
5.0000 mL | Freq: Three times a day (TID) | ORAL | Status: DC | PRN
  Administered 2024-01-20 – 2024-01-23 (×9): 5 mL via ORAL
  Filled 2024-01-20 (×11): qty 5

## 2024-01-20 MED ORDER — LOPERAMIDE HCL 2 MG PO CAPS: 2.0000 mg | ORAL_CAPSULE | ORAL | Status: DC | PRN

## 2024-01-20 NOTE — Plan of Care (Signed)
  Problem: Acute Rehab OT Goals (only OT should resolve) Goal: Pt. Will Perform Grooming Flowsheets (Taken 01/20/2024 1011) Pt Will Perform Grooming:  with supervision  standing Goal: Pt. Will Perform Lower Body Bathing Flowsheets (Taken 01/20/2024 1011) Pt Will Perform Lower Body Bathing:  with supervision  sitting/lateral leans  with adaptive equipment Goal: Pt. Will Perform Lower Body Dressing Flowsheets (Taken 01/20/2024 1011) Pt Will Perform Lower Body Dressing:  with supervision  with adaptive equipment  sitting/lateral leans Goal: Pt. Will Transfer To Toilet Flowsheets (Taken 01/20/2024 1011) Pt Will Transfer to Toilet:  with supervision  stand pivot transfer Goal: Pt. Will Perform Toileting-Clothing Manipulation Flowsheets (Taken 01/20/2024 1011) Pt Will Perform Toileting - Clothing Manipulation and hygiene:  with modified independence  sitting/lateral leans Goal: Pt/Caregiver Will Perform Home Exercise Program Flowsheets (Taken 01/20/2024 1011) Pt/caregiver will Perform Home Exercise Program:  Increased ROM  Increased strength  Right Upper extremity  Left upper extremity  Independently  Link Burgeson OT, MOT

## 2024-01-20 NOTE — Evaluation (Addendum)
 Occupational Therapy Evaluation Patient Details Name: Amber Stephenson MRN: [id number] DOB: 1945-09-22 Today's Date: 01/20/2024   History of Present Illness   Amber Stephenson is a 78 y.o. female with medical history significant of endometrial CA, AFib, CAD, chronic pain, T2DM who presented to the ED early this morning with persistent severe diarrhea and abdominal pain. She and her husband report many episodes of loose stools despite taking imodium  and lomotil  at home the past several days.  She's had trouble taking any food and has been pushing fluids at home, feeling more weak than usual and has had several falls since Sunday. She was recently treated with abx for UTI. (per MD)     Clinical Impressions Pt agreeable to OT and PT co-evaluation. Pt reports leaning on husband for mobility at baseline as well as assist for donning socks from spouse. Today pt required mod A for step pivot transfer to chair without AD. Pt required mod A for bed mobility with slow and labored movement. Pt reported she usually has better mobility than she demonstrated today. B UE generally weak with baseline R shoulder limitation. Max A for lower body ADL's based on observations today. Pt left in the chair with chair alarm set and call bell within reach. Pt will benefit from continued OT in the hospital and recommended venue below to increase strength, balance, and endurance for safe ADL's.        If plan is discharge home, recommend the following:   A lot of help with walking and/or transfers;A lot of help with bathing/dressing/bathroom;Assistance with cooking/housework;Assist for transportation;Help with stairs or ramp for entrance     Functional Status Assessment   Patient has had a recent decline in their functional status and demonstrates the ability to make significant improvements in function in a reasonable and predictable amount of time.     Equipment Recommendations   Other (comment) (maybe a BSC  if pt decides to go home; further discussion needed.)             Precautions/Restrictions   Precautions Precautions: Fall Recall of Precautions/Restrictions: Intact Restrictions Weight Bearing Restrictions Per Provider Order: No     Mobility Bed Mobility Overal bed mobility: Needs Assistance Bed Mobility: Supine to Sit     Supine to sit: Mod assist, HOB elevated, Used rails     General bed mobility comments: slow and labored movement; assist to pull/push to sit from supine.    Transfers Overall transfer level: Needs assistance   Transfers: Sit to/from Stand, Bed to chair/wheelchair/BSC Sit to Stand: Mod assist     Step pivot transfers: Mod assist     General transfer comment: Labored movement; holding onto PT during steps to chair.      Balance Overall balance assessment: Needs assistance Sitting-balance support: No upper extremity supported, Feet supported Sitting balance-Leahy Scale: Fair Sitting balance - Comments: seated at EOB   Standing balance support: Bilateral upper extremity supported, During functional activity Standing balance-Leahy Scale: Poor Standing balance comment: holding onto physical therapist; poor                           ADL either performed or assessed with clinical judgement   ADL Overall ADL's : Needs assistance/impaired     Grooming: Set up;Sitting   Upper Body Bathing: Set up;Sitting   Lower Body Bathing: Maximal assistance;Sitting/lateral leans   Upper Body Dressing : Set up;Sitting   Lower Body Dressing: Maximal assistance;Sitting/lateral leans  Toilet Transfer: Moderate assistance;Stand-pivot Toilet Transfer Details (indicate cue type and reason): EOB to chair without AD Toileting- Clothing Manipulation and Hygiene: Moderate assistance;Sitting/lateral lean       Functional mobility during ADLs: Moderate assistance       Vision Baseline Vision/History: 1 Wears glasses Ability to See in  Adequate Light: 0 Adequate Patient Visual Report: No change from baseline Vision Assessment?: No apparent visual deficits;Wears glasses for reading     Perception Perception: Not tested       Praxis Praxis: Not tested       Pertinent Vitals/Pain Pain Assessment Pain Assessment: 0-10 Pain Score: 7  Pain Location: R knee and back Pain Descriptors / Indicators: Aching Pain Intervention(s): Limited activity within patient's tolerance, Monitored during session, Repositioned     Extremity/Trunk Assessment Upper Extremity Assessment Upper Extremity Assessment: Generalized weakness;RUE deficits/detail RUE Deficits / Details: 3-/5 shoulder flexion from previous shoulder injury. Near full P/ROM for shoulder abduction/flexion.   Lower Extremity Assessment Lower Extremity Assessment: Defer to PT evaluation   Cervical / Trunk Assessment Cervical / Trunk Assessment: Kyphotic   Communication Communication Communication: No apparent difficulties   Cognition Arousal: Alert Behavior During Therapy: WFL for tasks assessed/performed Cognition: No apparent impairments                               Following commands: Intact       Cueing  General Comments   Cueing Techniques: Verbal cues;Tactile cues                 Home Living Family/patient expects to be discharged to:: Private residence Living Arrangements: Spouse/significant other Available Help at Discharge: Family;Available 24 hours/day Type of Home: House Home Access: Stairs to enter [employer] of Steps: 5 Entrance Stairs-Rails: Left Home Layout: Able to live on main level with bedroom/bathroom;Two level     Bathroom Shower/Tub: Producer, television/film/video: Standard Bathroom Accessibility: Yes How Accessible: Accessible via walker Home Equipment: Agricultural consultant (2 wheels);Cane - single point;Shower seat - built in;Grab bars - toilet;Grab bars - tub/shower;Other (comment) (access to  3 wheel walker if needed per chart)   Additional Comments: Uses 3 Ls O2 as needed and at night. Pt reports same living history as prior admission.; pt uses long handled sponge for bathing.       Prior Functioning/Environment Prior Level of Function : Needs assist;History of Falls (last six months)       Physical Assist : ADLs (physical);Mobility (physical) Mobility (physical): Transfers;Gait ADLs (physical): IADLs;Dressing Mobility Comments: 3 falls in 6 months; pt reports leaning on husband when ambulating in the house and outside. ADLs Comments: Pt reports that spouse assists with donning socks. Indpeendent with other ADL's. Assited IADL's by spouse.    OT Problem List: Decreased strength;Decreased range of motion;Decreased activity tolerance;Impaired balance (sitting and/or standing);Pain   OT Treatment/Interventions: Self-care/ADL training;Therapeutic exercise;Therapeutic activities;Patient/family education;Balance training;DME and/or AE instruction      OT Goals(Current goals can be found in the care plan section)   Acute Rehab OT Goals Patient Stated Goal: return home OT Goal Formulation: With patient Time For Goal Achievement: 02/03/24 Potential to Achieve Goals: Good   OT Frequency:  Min 3X/week    Co-evaluation PT/OT/SLP Co-Evaluation/Treatment: Yes Reason for Co-Treatment: To address functional/ADL transfers   OT goals addressed during session: ADL's and self-care  End of Session Equipment Utilized During Treatment: Gait belt  Activity Tolerance: Patient tolerated treatment well Patient left: in chair;with call bell/phone within reach;with chair alarm set  OT Visit Diagnosis: Unsteadiness on feet (R26.81);Other abnormalities of gait and mobility (R26.89);Muscle weakness (generalized) (M62.81);History of falling (Z91.81)                Time: 9178-9159 OT Time Calculation (min): 19 min Charges:  OT General Charges $OT Visit: 1  Visit OT Evaluation $OT Eval Low Complexity: 1 Low  Kaylee Trivett OT, MOT   Jayson Person 01/20/2024, 10:09 AM

## 2024-01-20 NOTE — Progress Notes (Signed)
 Nurse at bedside,patient alert and oriented times four this morning.Patient out of bed to chair.Patient did c/o pain to right leg rated a 9,sharp and constant,Dr Bernardino Come notified. Plan of care on going.

## 2024-01-20 NOTE — TOC Initial Note (Signed)
 Transition of Care [facility]) - Initial/Assessment Note    Patient Details  Name: Amber Stephenson MRN: [id number] Date of Birth: Sep 03, 1945  Transition of [facility]) CM/SW Contact:    Sharlyne Stabs, RN Phone Number: 01/20/2024, 3:36 PM  Clinical Narrative:     Patient admitted with Acute blood loss anemia. Considered to be high risk for readmission. PT is recommending SNF. CM called her husband, Vaughan. He States she is active with [facility] for HHPT.  She wants to return home with home health. Home oxygen  was set up in ED last visit with [employer].  GI consulted. TOC following.             Expected Discharge Plan: Home w Home Health Services Barriers to Discharge: Continued Medical Work up   Patient Goals and CMS Choice Patient states their goals for this hospitalization and ongoing recovery are:: return home with home health CMS Medicare.gov Compare Post Acute Care list provided to:: Patient Represenative (must comment) Choice offered to / list presented to : Spouse  ownership interest in [facility]provided to:: Spouse    Expected Discharge Plan and Services      Living arrangements for the past 2 months: Single Family Home           Prior Living Arrangements/Services Living arrangements for the past 2 months: Single Family Home Lives with:: Spouse Patient language and need for interpreter reviewed:: Yes        Need for Family Participation in Patient Care: Yes (Comment) Care giver support system in place?: Yes (comment) Current home services: DME Criminal Activity/Legal Involvement Pertinent to Current Situation/Hospitalization: No - Comment as needed  Activities of Daily Living   ADL Screening (condition at time of admission) Independently performs ADLs?: Yes (appropriate for developmental age) Is the patient deaf or have difficulty hearing?: No Does the patient have difficulty seeing, even when wearing glasses/contacts?: No Does the  patient have difficulty concentrating, remembering, or making decisions?: No  Permission Sought/Granted      Permission granted to share info w Relationship: Spouse     Emotional Assessment       Orientation: : Oriented to Self, Oriented to Place, Oriented to Situation Alcohol  / Substance Use: Not Applicable Psych Involvement: No (comment)  Admission diagnosis:  Hyponatremia [E87.1] Diarrhea, unspecified type [R19.7] Acute anemia [D64.9] ABLA (acute blood loss anemia) [D62] Patient Active Problem List   Diagnosis Date Noted   ABLA (acute blood loss anemia) 01/19/2024   Essential hypertension 01/07/2024   Rectal bleeding 10/07/2023   Nocturnal enuresis 08/25/2023   Hyponatremia 06/10/2023   Abdominal pain 06/10/2023   Candidiasis of mouth 05/15/2023   Type 2 diabetes mellitus with hyperglycemia, with long-term current use of insulin  (HCC) 02/12/2023   Long term (current) use of oral hypoglycemic drugs 02/12/2023   Primary hypertension 01/14/2023   Endometrial cancer (HCC) 01/12/2023   Vulvar itching 12/22/2022   Burning with urination 11/13/2022   Vitamin B12 deficiency 11/09/2022   Frequent falls 09/01/2022   Hemorrhoids 03/11/2022   Microalbuminuria 01/21/2022   Tremor, essential 12/11/2021   Chronic bilateral low back pain with right-sided sciatica 07/19/2021   Vaginal itching 04/16/2021   Sensory ataxia 03/28/2021   Elevated troponin 12/14/2020   Chronic post-traumatic stress disorder (PTSD) 12/06/2020   Senile corneal changes 12/06/2020   Postmenopausal bleeding 12/06/2020   Acquired hammer toe 12/06/2020   Opioid dependence 12/06/2020   Hypermetropia 12/06/2020   Generalized weakness 12/06/2020   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  Generalized anxiety disorder 12/06/2020   Functional diarrhea 10/26/2020   Insulin  dependent type 2 diabetes mellitus (HCC) 09/24/2020   Chronic respiratory failure with hypoxia (HCC) 04/25/2020   Chronic constipation  04/25/2020   Chronic pain syndrome 08/22/2019   CAD (coronary artery disease)    Hypocalcemia    S/P shoulder replacement, 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   Mixed hyperlipidemia 05/20/2019   Vertigo 04/25/2019   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   OSA (obstructive sleep apnea) 02/24/2019   At risk for adverse drug event 07/06/2018   Non-alcoholic fatty liver disease 01/12/2018   Mild intermittent asthma 01/12/2018   Senile osteoporosis 12/15/2017   Atrial fibrillation with RVR (HCC) 05/19/2017   Migraine headache with aura 02/12/2016   Diabetic neuropathy (HCC) 02/06/2016   Bipolar 1 disorder (HCC) 10/04/2015   Major depressive disorder 10/03/2015   Herpes genitalis in women 07/16/2015   Long term current use of anticoagulant 05/30/2015   Chronic heart failure with preserved ejection fraction (HFpEF) (HCC) 05/30/2015   RLS (restless legs syndrome) 04/27/2015   Insomnia 01/23/2015   Chronic atrial fibrillation with RVR (HCC) 01/04/2015   PCP:  [name], MD Pharmacy:   [facility] [phone] - [place], [place] - [address] AT [facility] [address] [place] [address] Phone: [phone] Fax: [phone]  [facility] - [place], [place] - [address] [place] [address] Phone: [phone] Fax: [phone]     Social Drivers of Health (SDOH) Social History: SDOH Screenings   Food Insecurity: No Food Insecurity (01/19/2024)  Housing: Low Risk  (01/19/2024)  Transportation Needs: No Transportation Needs (01/19/2024)  Utilities: Not At Risk (01/19/2024)  Alcohol  Screen: Low Risk  (08/13/2023)  Depression (PHQ2-9): Low Risk  (01/12/2024)  Recent Concern: Depression (PHQ2-9) - High Risk (12/18/2023)  Financial Resource Strain: Low Risk  (08/13/2023)  Physical Activity: Insufficiently Active (08/13/2023)  Social Connections: Moderately Integrated (01/19/2024)  Stress: No Stress Concern  Present (08/13/2023)  Tobacco Use: Low Risk  (01/19/2024)  Health Literacy: Adequate Health Literacy (08/13/2023)   SDOH Interventions:     Readmission Risk Interventions    01/20/2024    3:33 PM 01/20/2024    3:29 PM 10/09/2023   10:41 AM  Readmission Risk Prevention Plan  Transportation Screening  Complete Complete  Medication Review [employer])  Complete Complete  PCP or Specialist appointment within 3-5 days of discharge  Not Complete   HRI or Home Care Consult  Complete Complete  SW Recovery Care/Counseling Consult Complete Not Complete Complete  Palliative Care Screening  Not Applicable Not Applicable  Skilled Nursing Facility  Not Applicable Not Applicable

## 2024-01-20 NOTE — Evaluation (Signed)
 Physical Therapy Evaluation Patient Details Name: Amber Stephenson MRN: [id number] DOB: [birth year] Today's Date: 01/20/2024  History of Present Illness  Amber Stephenson is a 78 y.o. female with medical history significant of endometrial CA, AFib, CAD, chronic pain, T2DM who presented to the ED early this morning with persistent severe diarrhea and abdominal pain. She and her husband report many episodes of loose stools despite taking imodium  and lomotil  at home the past several days.  She's had trouble taking any food and has been pushing fluids at home, feeling more weak than usual and has had several falls since Sunday. She was recently treated with abx for UTI.   Clinical Impression  Patient agreeable to OT/PT co-evaluation. On this date, patient requires moderate assist for bed mobility, functional transfers, and very short side steps at bedside with PT support. Patient reports at baseline, she does not use AD but rather uses furniture for stability in home and support of husband outside of home when ambulating. Patient reports 3 falls in last 6 months, where she describes tripping and/or missing a step. Patient presents with extensive bruising to RLE/knee, that limits her AROM and functional mobility and is tender to touch. Patient generally weak throughout. Patient tolerated sitting in recliner at end of session, call button within reach, chair alarm activated. Patient will benefit from continued skilled physical therapy acutely and in recommended venue in order to address deficits prior to returning home for her safety.        If plan is discharge home, recommend the following: A lot of help with walking and/or transfers;A lot of help with bathing/dressing/bathroom;Assist for transportation;Help with stairs or ramp for entrance;Assistance with cooking/housework   Can travel by private vehicle        Equipment Recommendations None recommended by PT  Recommendations for Other Services        Functional Status Assessment Patient has had a recent decline in their functional status and demonstrates the ability to make significant improvements in function in a reasonable and predictable amount of time.     Precautions / Restrictions Precautions Precautions: Fall Recall of Precautions/Restrictions: Intact Restrictions Weight Bearing Restrictions Per Provider Order: No      Mobility  Bed Mobility Overal bed mobility: Needs Assistance Bed Mobility: Supine to Sit     Supine to sit: Mod assist, HOB elevated, Used rails     General bed mobility comments: Slow labored movement. limited due to pain and weakness. assist via pull to sit and verbal cues    Transfers Overall transfer level: Needs assistance Equipment used: None Transfers: Sit to/from Stand, Bed to chair/wheelchair/BSC Sit to Stand: Mod assist   Step pivot transfers: Mod assist       General transfer comment: No AD during bed>chair but mod A with pt using PT forearm for support. pt being limited from inc pain when weight bearing.    Ambulation/Gait Ambulation/Gait assistance: Mod assist Gait Distance (Feet): 3 Feet Assistive device: None Gait Pattern/deviations: Step-through pattern, Decreased step length - right, Decreased step length - left, Decreased stride length, Trunk flexed, Decreased weight shift to right Gait velocity: Dec     General Gait Details: Pt limited to a few side steps from bed to chair due to pain and fatigue, mod A with pt using PT forearm for support.  Stairs            Wheelchair Mobility     Tilt Bed    Modified Rankin (Stroke Patients Only)  Balance Overall balance assessment: Needs assistance Sitting-balance support: No upper extremity supported, Feet supported Sitting balance-Leahy Scale: Fair Sitting balance - Comments: seated at EOB   Standing balance support: Bilateral upper extremity supported, During functional activity Standing balance-Leahy  Scale: Poor Standing balance comment: using PT forearm for support           Pertinent Vitals/Pain Pain Assessment Pain Assessment: 0-10 Pain Score: 7  Pain Location: R knee and back Pain Descriptors / Indicators: Aching Pain Intervention(s): Limited activity within patient's tolerance, Repositioned    Home Living Family/patient expects to be discharged to:: Private residence Living Arrangements: Spouse/significant other Available Help at Discharge: Family;Available 24 hours/day Type of Home: House Home Access: Stairs to enter Entrance Stairs-Rails: Left Entrance Stairs-Number of Steps: 5   Home Layout: Able to live on main level with bedroom/bathroom;Two level Home Equipment: Agricultural consultant (2 wheels);Cane - single point;Shower seat - built in;Grab bars - toilet;Grab bars - tub/shower;Other (comment);Adaptive equipment Additional Comments: Pt reports no change in home set up since last admission. reports intermittent use of O2 at night    Prior Function Prior Level of Function : Needs assist;History of Falls (last six months)       Physical Assist : ADLs (physical);Mobility (physical) Mobility (physical): Transfers;Gait ADLs (physical): IADLs;Dressing Mobility Comments: Pt reports no use of AD in home or out. Describes using furniture fo rsupport in home and support of husband outside home. ADLs Comments: Independent with all ADLs besides donning socks. iADLs by husband     Extremity/Trunk Assessment   Upper Extremity Assessment Upper Extremity Assessment: Defer to OT evaluation RUE Deficits / Details: 3-/5 shoulder flexion from previous shoulder injury. Near full P/ROM for shoulder abduction/flexion.    Lower Extremity Assessment Lower Extremity Assessment: Generalized weakness;RLE deficits/detail RLE Deficits / Details: Pt with extensive bruising to RLE/knee due to fall. Ankle DF, knee ext, and hip flexion all painful and limiting AROM, 3+/5 MMT at best. LLE WNL,  all MMT 4- to 4/5 RLE: Unable to fully assess due to pain RLE Coordination: decreased gross motor    Cervical / Trunk Assessment Cervical / Trunk Assessment: Kyphotic  Communication   Communication Communication: No apparent difficulties    Cognition Arousal: Alert Behavior During Therapy: WFL for tasks assessed/performed   PT - Cognitive impairments: No apparent impairments     Following commands: Intact       Cueing Cueing Techniques: Verbal cues, Tactile cues     General Comments      Exercises     Assessment/Plan    PT Assessment Patient needs continued PT services;All further PT needs can be met in the next venue of care  PT Problem List Decreased strength;Decreased range of motion;Decreased activity tolerance;Decreased balance;Decreased mobility;Decreased coordination;Decreased safety awareness;Pain       PT Treatment Interventions DME instruction;Gait training;Stair training;Patient/family education;Functional mobility training;Therapeutic activities;Therapeutic exercise;Balance training    PT Goals (Current goals can be found in the Care Plan section)  Acute Rehab PT Goals Patient Stated Goal: Return home PT Goal Formulation: With patient Time For Goal Achievement: 01/27/24 Potential to Achieve Goals: Good    Frequency Min 3X/week     Co-evaluation PT/OT/SLP Co-Evaluation/Treatment: Yes Reason for Co-Treatment: To address functional/ADL transfers PT goals addressed during session: Mobility/safety with mobility OT goals addressed during session: ADL's and self-care       AM-PAC PT 6 Clicks Mobility  Outcome Measure Help needed turning from your back to your side while in a flat bed without using bedrails?: A  Little Help needed moving from lying on your back to sitting on the side of a flat bed without using bedrails?: A Lot Help needed moving to and from a bed to a chair (including a wheelchair)?: A Lot Help needed standing up from a chair using  your arms (e.g., wheelchair or bedside chair)?: A Lot Help needed to walk in hospital room?: A Lot Help needed climbing 3-5 steps with a railing? : A Lot 6 Click Score: 13    End of Session Equipment Utilized During Treatment: Gait belt Activity Tolerance: Patient tolerated treatment well;Patient limited by pain;Patient limited by fatigue Patient left: in chair;with call bell/phone within reach;with chair alarm set   PT Visit Diagnosis: Unsteadiness on feet (R26.81);Other abnormalities of gait and mobility (R26.89);Pain;Repeated falls (R29.6);History of falling (Z91.81);Muscle weakness (generalized) (M62.81) Pain - Right/Left: Right Pain - part of body: Knee    Time: 9176-9158 PT Time Calculation (min) (ACUTE ONLY): 18 min   Charges:   PT Evaluation $PT Eval Low Complexity: 1 Low   PT General Charges $$ ACUTE PT VISIT: 1 Visit        1:04 PM, 01/20/24 Dayvion Sans Powell-Butler, PT, DPT [place] with [facility]

## 2024-01-20 NOTE — Progress Notes (Signed)
 Subjective: Patient reports no BMs today. Feeling hungry. Abdominal pain is improved. She is wanting to eat something.   Objective: Vital signs in last 24 hours: Temp:  [97.6 F (36.4 C)-98 F (36.7 C)] 98 F (36.7 C) (08/20 0102) Pulse Rate:  [68-99] 95 (08/19 2000) Resp:  [20] 20 (08/20 0102) BP: (127-165)/(47-96) 149/92 (08/20 0102) SpO2:  [90 %-98 %] 96 % (08/20 0102) Weight:  [81.4 kg] 81.4 kg (08/19 2000) Last BM Date : 01/20/24 General:   Alert and oriented, pleasant Head:  Normocephalic and atraumatic. Eyes:  No icterus, sclera clear. Conjuctiva pink. Noted green/purple bruising to L eye.   Mouth:  Without lesions, mucosa pink and moist.  Neck:  Supple, without thyromegaly or masses.  Heart:  S1, S2 present, no murmurs noted.  Lungs: Clear to auscultation bilaterally, without wheezing, rales, or rhonchi.  Abdomen:  Bowel sounds present, soft,non-distended. Mild TTP noted to diffuse abdomen, mostly distal, very little TTP in mid abdomen. No HSM or hernias noted. No rebound or guarding. No masses appreciated  Neurologic:  Alert and  oriented x4;  grossly normal neurologically. Skin:  Warm and dry, intact without significant lesions.  Psych:  Alert and cooperative. Normal mood and affect.  Intake/Output from previous day: 08/19 0701 - 08/20 0700 In: 552.5 [I.V.:552.5] Out: 500 [Urine:500] Intake/Output this shift: No intake/output data recorded.  Lab Results: Recent Labs    01/19/24 0607 01/19/24 0749 01/19/24 1447 01/19/24 2007 01/20/24 0409  WBC 5.9  --   --   --  4.8  HGB 7.6*   < > 7.4* 7.6* 7.8*  HCT 24.0*   < > 23.6* 23.9* 24.0*  PLT 160  --   --   --  171   < > = values in this interval not displayed.   BMET Recent Labs    01/19/24 0749 01/19/24 2007 01/20/24 0409  NA 127* 127* 127*  K 4.4 3.8 3.9  CL 95* 98 99  CO2 21* 18* 21*  GLUCOSE 89 75 85  BUN 14 12 12   CREATININE 1.08* 0.76 0.77  CALCIUM  7.5* 7.1* 7.2*   LFT Recent Labs     01/19/24 0607 01/20/24 0409  PROT 6.7 6.1*  ALBUMIN  3.3* 3.1*  AST 30 26  ALT 11 <5  ALKPHOS 136* 138*  BILITOT 0.8 1.1   PT/INR Recent Labs    01/20/24 0409  LABPROT 21.4*  INR 1.8*    Studies/Results: MR ABDOMEN W WO CONTRAST Result Date: 01/19/2024 CLINICAL DATA:  Acute onset watery diarrhea EXAM: MRI ABDOMEN WITHOUT AND WITH CONTRAST TECHNIQUE: Multiplanar multisequence MR imaging of the abdomen was performed both before and after the administration of intravenous contrast. CONTRAST:  8mL GADAVIST  GADOBUTROL  1 MMOL/ML IV SOLN COMPARISON:  CT abdomen pelvis January 19, 2024, same day MR pelvis. FINDINGS: Lower chest: Cardiomegaly. No pericardial no pleural fluid. Limited evaluation of the lungs on MRI. Hepatobiliary: Subcentimeter left hepatic lobe cyst . No significant hepatic steatosis. Non cirrhotic morphology. No hepatic biliary dilatation. Gallbladder is slightly distended with questionable sludge/tiny stones. Normal wall thickness. CBD is dilated up to 8 mm proximally and tapers to 4 mm at the pancreatic head. Pancreas: Atrophic changes of the pancreas. No significant pancreatic ductal dilatation or abnormal enhancement. Spleen:  Within normal limits. Adrenals/Urinary Tract: Punctate 4 mm left adrenal postcontrast filling defect may represent a tiny nodule likely benign. Right kidney interpolar cortical T2 hyperintense cyst which does not require imaging follow-up. No hydronephrosis. Stomach/Bowel: Gaseous distension of the transverse  and ascending colon with air-fluid level indicating diarrhea. No bowel obstruction, abnormal enhancement or bowel mass lesion. A few colonic diverticula in the descending colon without definite evidence of diverticulitis. Stomach appears unremarkable. Normal appendix. Vascular/Lymphatic: No suspicious lymphadenopathy. Vascular structures are unremarkable. Musculoskeletal: Scoliosis.  Degenerative changes of the spine. IMPRESSION: Gas and fluid containing  distended colon in keeping with history of diarrhea. Few colonic diverticula. No bowel mass , obstruction or wall thickening . Left hepatic lobe cyst. Possible gallbladder sludge. Cardiomegaly. Query punctate left adrenal nodule, too small to characterize. Electronically Signed   By: Megan  Zare M.D.   On: 01/19/2024 20:34   MR PELVIS W WO CONTRAST Result Date: 01/19/2024 CLINICAL DATA:  Severe diarrhea EXAM: MRI PELVIS WITHOUT AND WITH CONTRAST TECHNIQUE: Multiplanar multisequence MR imaging of the pelvis was performed both before and after administration of intravenous contrast. CONTRAST:  8mL GADAVIST  GADOBUTROL  1 MMOL/ML IV SOLN COMPARISON:  Same day CT abdomen pelvis January 19, 2024. FINDINGS: Urinary Tract:  Normal bladder. Bowel: Gas and fluid containing distended transverse colon consistent with known diarrhea. No suspicious bowel lesion within the field of few. Appendix is normal. Vascular/Lymphatic: No suspicious lymphadenopathy. Vascular structures are unremarkable. No aneurysm. Reproductive: Postmenopausal uterus measures 7.8 x 3.1 x 3.8 cm. Normal myometrium. Endometrial thickness measures 1.9 mm. Right ovary is 1.9 x 1 cm. Left ovary measures 1.7 x 1 cm. Other: Bilateral fat containing inguinal hernias right greater than left . Please refer to same day MRI abdomen for further details of the visceral structures of the abdomen. Musculoskeletal: Subcutaneous T2 hyperintensity along the left anterior abdominal wall correlating to site of fat stranding on prior CT and suggestive of inflammatory changes. No significant fluid collection. Degenerative changes of the spine. IMPRESSION: No suspicious bowel lesion or obstruction. Dilated fluid gas and fluid containing transverse colon in keeping with known diarrhea state. Bilateral fat containing inguinal hernias, right greater than left. Mild inflammatory changes in subcutaneous left ventral abdominal wall. Electronically Signed   By: Megan  Zare M.D.   On:  01/19/2024 20:12   CT ABDOMEN PELVIS WO CONTRAST Result Date: 01/19/2024 CLINICAL DATA:  Abdominal pain, acute, nonlocalized EXAM: CT ABDOMEN AND PELVIS WITHOUT CONTRAST TECHNIQUE: Multidetector CT imaging of the abdomen and pelvis was performed following the standard protocol without IV contrast. RADIATION DOSE REDUCTION: This exam was performed according to the departmental dose-optimization program which includes automated exposure control, adjustment of the mA and/or kV according to patient size and/or use of iterative reconstruction technique. COMPARISON:  CT chest, abdomen, and pelvis dated 01/12/2024. FINDINGS: Lower chest: Chronic bibasilar subpleural reticulation and ground-glass changes. Similar patchy ground-glass nodular densities, most pronounced in the visualized right upper lobe, concerning for multifocal infection, better evaluated on the recent CT chest, abdomen common pelvis dated 01/12/2024. Cardiomegaly. Dense mitral calcification. Hepatobiliary: No suspicious focal hepatic lesion identified within the limits of an unenhanced exam. Gallbladder is unremarkable. No biliary dilatation. Pancreas: Unremarkable Spleen: Unremarkable Adrenals/Urinary Tract: Stable 1 cm left adrenal nodule, essentially unchanged dating back to 12/26/2019, most compatible with a benign etiology and favored to represent an adenoma. The right adrenal gland is unremarkable. No urolithiasis or hydronephrosis. Bladder is unremarkable. Stomach/Bowel: Stomach is within normal limits. Appendix appears normal. No obstruction or focal inflammatory changes. Air-fluid levels within the colon, as can be seen in the setting of diarrheal illness. Vascular/Lymphatic: Aortic atherosclerosis. No enlarged abdominal or pelvic lymph nodes. Reproductive: Uterus and bilateral adnexa are unremarkable. Other: No abdominopelvic ascites. No intraperitoneal free air. Fat  containing right inguinal hernia. Similar infiltration of the subcutaneous  soft tissues of the ventral abdominal pannus. No loculated fluid collection. Musculoskeletal: No acute osseous abnormality. No suspicious osseous lesion. Mild chronic T9, T11, and L2 compression deformities. Degenerative disc changes of the thoracolumbar spine. IMPRESSION: 1. Air-fluid levels within the colon, as can be seen in the setting of diarrheal illness. 2. Similar infiltration of the subcutaneous soft tissues of the ventral abdominal pannus. No loculated fluid collection. Cellulitis not excluded. 3. Chronic interstitial changes of the visualized lung bases with similar patchy ground-glass nodular densities, concerning for multifocal infection and better evaluated on the recent CT chest, abdomen and pelvis dated 01/12/2024. Aortic Atherosclerosis (ICD10-I70.0). Electronically Signed   By: Harrietta Sherry M.D.   On: 01/19/2024 12:55    Assessment: 78 year old female with endometrial cancer medically inoperable, s/p IMRT, Afib on Eliquis , bipolar 1 disorder, CAD, CHF, chronic pain, DM, HTN, Herpes, NAFLD, OSA, osteopenia, RLS, pulmonary hypertension and previous history of microscopic colitis (diagnosed at outside facility, not on therapy), who came to the hospital with diarrhea, abdominal pain and rectal bleeding.   Abdominal Pain: -New onset, generalized worse in RLQ initially though more diffuse today -pain seems to be improving today  -CT A/P without contrast with no findings to explain symptoms, air fluid levels within colon, similar similar infiltration of the subcutaneous soft tissue of the ventral abdominal pannus with no loculated fluid collection. Etiology is unclear.  -endorsed some urge to urinate but some oliguria -MR abd/pelvis w wo showed Gas and fluid containing distended colon in keeping with history of diarrhea. Few colonic diverticula. No bowel mass, obstruction or wall thickening . Left hepatic lobe cyst. Possible gallbladder sludge. Cardiomegaly. Query punctate left adrenal  nodule, too small to characterize.  Diarrhea: -acute onset x4 days ago -antibiotics recently for UTI, though C diff negative -GI pathogen panel negative  -history of microscopic colitis -history of abdominal radiation for endometrial cancer  Cannot rule out microscopic colitis or even radiation induced colitis/proctitis, planning for colonoscopy while in patient once sodium improved   Anemia/rectal bleeding:  -hgb declining since may -hgb 7.6 on admission, 7.8 tody -ongoing intermittent rectal bleeding over the past year, worse since completing radiation for endometrial cancer -denies NSAIDs, diclofenac  on outpatient med list -no previous EGD on file -Last TCS 2018 with tubular adenoma, hemorrhoids, microscopic colitis  -scheduled for EGD/Colonoscopy as outpatient on 8/29, but will pursue as inpatient, once sodium improved. ELIQUIS  will need to be held x48 hours prior to procedures (currently on hold)  Plan: EGD/Colonoscopy once sodium >130 (127 today) Hyponatremia management per hospitalist Eliquis  to be held x48 hours prior to procedures Trend h&h Monitor for overt GI bleeding Continue to hold Eliquis  Management of hyponatremia per hospitalist Supportive measures with analgesics/antiemetics as needed  Clear liquid diet today    LOS: 1 day    01/20/2024, 9:27 AM   Amber Ganesh L. Sephira Zellman, MSN, APRN, AGNP-C Adult-Gerontology Nurse Practitioner [facility] at [facility]

## 2024-01-20 NOTE — Plan of Care (Signed)

## 2024-01-20 NOTE — Progress Notes (Signed)
 Nurse at bedside,no c/o pain or discomfort noted at this time. Plan of care on going.

## 2024-01-20 NOTE — Progress Notes (Signed)
 [facility] PROGRESS NOTE  Amber Stephenson (DOB: 10-Mar-1946) FMW:[id number] PCP: [name], MD  Brief Narrative: Amber Stephenson is a 78 y.o. female with medical history significant of endometrial CA, AFib, CAD, chronic pain, T2DM who presented to the ED early this morning with persistent severe diarrhea and abdominal pain. She and her husband report many episodes of loose stools despite taking imodium  and lomotil  at home the past several days.  She's had trouble taking any food and has been pushing fluids at home, feeling more weak than usual and has had several falls since Sunday. She was recently treated with abx for UTI.    In the ED she was noted to be acutely anemic with +blood clots in stool. Admission requested, GI consulted.   Subjective: Feeling better, hungry now and wants to eat, still having frequent loose stools, but no fevers and no blood reported.   Objective: BP 121/75 (BP Location: Left Arm)   Pulse 63   Temp (!) 97.5 F (36.4 C) (Axillary)   Resp 18   Ht 5' 2 (1.575 m)   Wt 81.4 kg   SpO2 100%   BMI 32.82 kg/m   Gen: Chronically ill, but acutely nontoxic female in no distress HEENT: Superficial ulceration on tongue, no leukoplakia noted. Left periorbital ecchymosis in stages of evolution.  Pulm: Clear, nonlabored  CV: RRR, no MRG GI: Soft, modestly diffusely tender without rebound or guarding. +BS Neuro: Alert and oriented. No new focal deficits. Ext: Warm, no deformities. Skin: Ecchymoses primarily on RLE stable. No new rashes, lesions or ulcers on visualized skin   Assessment & Plan: ABLA due to lower GI bleeding: Hx diverticulosis and diverticulitis (admitted May 2025).  - T&S is UTD, will trend H/H.  - Hold anticoagulation   Diarrhea: Acutely worsened more chronic problem - Continuing PPI - GI pathogen panel and C. diff negative >> restart imodium .  - Lactic acid level normal, MRI abd/pelvis consistent with diarrheal illness, few  diverticula without inflammation.  - Endoscopic evaluation planned once Na is 130 and off DOAC > 48 hrs.    Oral ulcers:  - Magic mouthwash prn  AKI: Bland sediment with only hyaline casts.  - Resolved with volume repletion.  - Avoid nephrotoxins.   Hyponatremia: Acute on chronic.  - UNa remains inappropriately elevated, will give salt tabs. Unfortunately, needs clear liquid diet so can't restrict. Still isotonic saline since she's taking po.  - Continue holding lasix  and HCTZ, likely should avoid duplicate diuretic Tx. Will not continue thiazide going forward and hold both now.  - Hold ARB since normotensive.    AFib:  - Holding anticoagulation - Continue metoprolol    Endometrial CA: s/p IMRT, boost Tx:  - Follow up with Dr. Shannon   Chronic HFpEF, HTN: - BP elevated despite GI bleeding.  - Hold lasix  40mg  daily in setting of AKI and NPO status. Holding thiazide and ARB as well with AKI.   Chronic pain:  - Continue oxycontin  20mg  BID - Hold NSAID  - Continue gabapentin   - Give dilaudid  IV prn while NPO   Falls, weakness:  - PT/OT in addition to management as outlined above. She remains quite deconditioned, may require SNF level rehabilitation.    CAD:  - Continue BB, statin, hold anticoagulation (not on ASA).    IDT2DM: HbA1c 6.7%.  - SSI, moderate scale - Given euglycemia currently, NPO status, continue holding basal insulin  but low threshold to restart - Hold home metformin , mounjaro  (BMI 32).  Anxiety:  - Continue abilify , escitalopram , oxcarbazepine , xanax  - hydroxyzine    RLS:  - Continue requip  - Continue sinemet    Chronic hypoxic respiratory failure:  - Continue O2 prn  [name], MD [facility] [email or web address] 01/20/2024, 3:50 PM

## 2024-01-20 NOTE — Plan of Care (Signed)
  Problem: Acute Rehab PT Goals(only PT should resolve) Goal: Pt Will Go Supine/Side To Sit Outcome: Progressing Flowsheets (Taken 01/20/2024 1305) Pt will go Supine/Side to Sit: with contact guard assist Goal: Patient Will Transfer Sit To/From Stand Outcome: Progressing Flowsheets (Taken 01/20/2024 1305) Patient will transfer sit to/from stand: with contact guard assist Goal: Pt Will Transfer Bed To Chair/Chair To Bed Outcome: Progressing Flowsheets (Taken 01/20/2024 1305) Pt will Transfer Bed to Chair/Chair to Bed: with contact guard assist Goal: Pt Will Ambulate Outcome: Progressing Flowsheets (Taken 01/20/2024 1305) Pt will Ambulate:  10 feet  with minimal assist  with least restrictive assistive device   1:05 PM, 01/20/24 Virgal Warmuth Powell-Butler, PT, DPT [place] with [facility]

## 2024-01-21 ENCOUNTER — Inpatient Hospital Stay (HOSPITAL_COMMUNITY)

## 2024-01-21 DIAGNOSIS — D649 Anemia, unspecified: Secondary | ICD-10-CM

## 2024-01-21 DIAGNOSIS — K921 Melena: Secondary | ICD-10-CM

## 2024-01-21 DIAGNOSIS — R197 Diarrhea, unspecified: Secondary | ICD-10-CM

## 2024-01-21 DIAGNOSIS — D62 Acute posthemorrhagic anemia: Secondary | ICD-10-CM | POA: Diagnosis not present

## 2024-01-21 DIAGNOSIS — R1084 Generalized abdominal pain: Secondary | ICD-10-CM

## 2024-01-21 LAB — CBC WITH DIFFERENTIAL/PLATELET
Abs Immature Granulocytes: 0.02 K/uL (ref 0.00–0.07)
Basophils Absolute: 0 K/uL (ref 0.0–0.1)
Basophils Relative: 0 %
Eosinophils Absolute: 0.1 K/uL (ref 0.0–0.5)
Eosinophils Relative: 1 %
HCT: 28.3 % — ABNORMAL LOW (ref 36.0–46.0)
Hemoglobin: 8.6 g/dL — ABNORMAL LOW (ref 12.0–15.0)
Immature Granulocytes: 0 %
Lymphocytes Relative: 8 %
Lymphs Abs: 0.5 K/uL — ABNORMAL LOW (ref 0.7–4.0)
MCH: 26.6 pg (ref 26.0–34.0)
MCHC: 30.4 g/dL (ref 30.0–36.0)
MCV: 87.6 fL (ref 80.0–100.0)
Monocytes Absolute: 0.8 K/uL (ref 0.1–1.0)
Monocytes Relative: 13 %
Neutro Abs: 4.5 K/uL (ref 1.7–7.7)
Neutrophils Relative %: 78 %
Platelets: 184 K/uL (ref 150–400)
RBC: 3.23 MIL/uL — ABNORMAL LOW (ref 3.87–5.11)
RDW: 18.6 % — ABNORMAL HIGH (ref 11.5–15.5)
WBC: 5.9 K/uL (ref 4.0–10.5)
nRBC: 0 % (ref 0.0–0.2)

## 2024-01-21 LAB — BASIC METABOLIC PANEL WITH GFR
Anion gap: 7 (ref 5–15)
Anion gap: 8 (ref 5–15)
BUN: 9 mg/dL (ref 8–23)
BUN: 9 mg/dL (ref 8–23)
CO2: 20 mmol/L — ABNORMAL LOW (ref 22–32)
CO2: 22 mmol/L (ref 22–32)
Calcium: 7.2 mg/dL — ABNORMAL LOW (ref 8.9–10.3)
Calcium: 7.2 mg/dL — ABNORMAL LOW (ref 8.9–10.3)
Chloride: 103 mmol/L (ref 98–111)
Chloride: 103 mmol/L (ref 98–111)
Creatinine, Ser: 0.72 mg/dL (ref 0.44–1.00)
Creatinine, Ser: 0.73 mg/dL (ref 0.44–1.00)
GFR, Estimated: >60 mL/min (ref >60)
GFR, Estimated: >60 mL/min (ref >60)
Glucose, Bld: 82 mg/dL (ref 70–99)
Glucose, Bld: 108 mg/dL — ABNORMAL HIGH (ref 70–99)
Potassium: 4.1 mmol/L (ref 3.5–5.1)
Potassium: 4.7 mmol/L (ref 3.5–5.1)
Sodium: 130 mmol/L — ABNORMAL LOW (ref 135–145)
Sodium: 133 mmol/L — ABNORMAL LOW (ref 135–145)

## 2024-01-21 LAB — BLOOD GAS, VENOUS
Acid-base deficit: 3.9 mmol/L — ABNORMAL HIGH (ref 0.0–2.0)
Bicarbonate: 23.8 mmol/L (ref 20.0–28.0)
Drawn by: 4237
O2 Saturation: 31.5 %
Patient temperature: 36.8
pCO2, Ven: 52 mmHg (ref 44–60)
pH, Ven: 7.26 (ref 7.25–7.43)
pO2, Ven: <31 mmHg — CL (ref 32–45)

## 2024-01-21 LAB — GLUCOSE, CAPILLARY
Glucose-Capillary: 115 mg/dL — ABNORMAL HIGH (ref 70–99)
Glucose-Capillary: 117 mg/dL — ABNORMAL HIGH (ref 70–99)
Glucose-Capillary: 139 mg/dL — ABNORMAL HIGH (ref 70–99)
Glucose-Capillary: 76 mg/dL (ref 70–99)
Glucose-Capillary: 92 mg/dL (ref 70–99)

## 2024-01-21 LAB — HEMOGLOBIN AND HEMATOCRIT, BLOOD
HCT: 24.8 % — ABNORMAL LOW (ref 36.0–46.0)
Hemoglobin: 7.7 g/dL — ABNORMAL LOW (ref 12.0–15.0)

## 2024-01-21 LAB — AMMONIA: Ammonia: 37 umol/L — ABNORMAL HIGH (ref 9–35)

## 2024-01-21 LAB — BRAIN NATRIURETIC PEPTIDE: B Natriuretic Peptide: 404 pg/mL — ABNORMAL HIGH (ref 0.0–100.0)

## 2024-01-21 MED ORDER — ALBUTEROL SULFATE (2.5 MG/3ML) 0.083% IN NEBU
2.5000 mg | INHALATION_SOLUTION | Freq: Once | RESPIRATORY_TRACT | Status: AC
  Administered 2024-01-21: 2.5 mg via RESPIRATORY_TRACT
  Filled 2024-01-21: qty 3

## 2024-01-21 MED ORDER — FUROSEMIDE 10 MG/ML IJ SOLN: 40.0000 mg | Freq: Once | INTRAMUSCULAR | Status: DC

## 2024-01-21 MED ORDER — SODIUM CHLORIDE 1 G PO TABS: 1.0000 g | ORAL_TABLET | Freq: Three times a day (TID) | ORAL | Status: DC

## 2024-01-21 MED ORDER — BISACODYL 5 MG PO TBEC
10.0000 mg | DELAYED_RELEASE_TABLET | Freq: Once | ORAL | Status: AC
  Administered 2024-01-21: 10 mg via ORAL
  Filled 2024-01-21: qty 2

## 2024-01-21 MED ORDER — FUROSEMIDE 10 MG/ML IJ SOLN
40.0000 mg | Freq: Once | INTRAMUSCULAR | Status: AC
  Administered 2024-01-21: 40 mg via INTRAVENOUS
  Filled 2024-01-21: qty 4

## 2024-01-21 MED ORDER — LACTULOSE 10 GM/15ML PO SOLN
30.0000 g | Freq: Two times a day (BID) | ORAL | Status: DC
  Administered 2024-01-21: 30 g via ORAL
  Filled 2024-01-21 (×3): qty 60

## 2024-01-21 MED ORDER — PEG 3350-KCL-NA BICARB-NACL 420 G PO SOLR
4000.0000 mL | Freq: Once | ORAL | Status: AC
  Administered 2024-01-21: 4000 mL via ORAL

## 2024-01-21 NOTE — Progress Notes (Signed)
 Occupational Therapy Treatment Patient Details Name: Amber Stephenson MRN: [id number] DOB: Dec 27, 1945 Today's Date: 01/21/2024   History of present illness Amber Stephenson is a 78 y.o. female with medical history significant of endometrial CA, AFib, CAD, chronic pain, T2DM who presented to the ED early this morning with persistent severe diarrhea and abdominal pain. She and her husband report many episodes of loose stools despite taking imodium  and lomotil  at home the past several days.  She's had trouble taking any food and has been pushing fluids at home, feeling more weak than usual and has had several falls since Sunday. She was recently treated with abx for UTI.   OT comments  Pt agreeable to OT treatment but did become progressively more lethargic. Nursing reported pt had been medicated prior to session which could have been a reason for the lethargy. Pt was able to complete sit to stand from EOB with RW and min A. Able to ambulated with CGA to min A due to labored nature of movement with reports of fatigue and feeling as though her L leg would give out. Pt able to stand at the sink for hand washing with CGA and reports of fatigue. Pt able to complete some B UE AA/ROM but fatigued after 2 exercises of 5 reps. Pt left with HEP on AA/ROM. Pt left in the chair with call bell within reach and family present. Pt will benefit from continued OT in the hospital and recommended venue below to increase strength, balance, and endurance for safe ADL's.         If plan is discharge home, recommend the following:  A lot of help with walking and/or transfers;A lot of help with bathing/dressing/bathroom;Assistance with cooking/housework;Assist for transportation;Help with stairs or ramp for entrance   Equipment Recommendations  Other (comment) (possible BSC if pt goes home)          Precautions / Restrictions Precautions Precautions: Fall Recall of Precautions/Restrictions:  Intact Restrictions Weight Bearing Restrictions Per Provider Order: No       Mobility Bed Mobility Overal bed mobility: Needs Assistance Bed Mobility: Supine to Sit     Supine to sit: Mod assist, HOB elevated, Used rails, Min assist     General bed mobility comments: labored movement; use of rail, posterior support, and hand held assist to sit upright and scoot to EOB.    Transfers Overall transfer level: Needs assistance Equipment used: None Transfers: Sit to/from Stand, Bed to chair/wheelchair/BSC Sit to Stand: Min assist           General transfer comment: Use of RW; min A to boost from EOB followed by ambulation to toilet as mentioned above.     Balance Overall balance assessment: Needs assistance Sitting-balance support: No upper extremity supported, Feet supported Sitting balance-Leahy Scale: Fair Sitting balance - Comments: seated at EOB   Standing balance support: Bilateral upper extremity supported, During functional activity Standing balance-Leahy Scale: Poor Standing balance comment: poor to fair; using RW; pt becoming less stable as time on feet progressed.                           ADL either performed or assessed with clinical judgement   ADL Overall ADL's : Needs assistance/impaired     Grooming: Wash/dry hands;Standing;Contact guard assist;Minimal assistance Grooming Details (indicate cue type and reason): Pt standing at the sink to wash face for ~2 minutes. Pt educated to walk into RW at the edge of the  sink. Pt reported feeling like her L leg would give out, but pt was able to finish and make it back to the chair.                 Toilet Transfer: Minimal assistance;Contact guard assist;Ambulation;Rolling walker (2 wheels) Toilet Transfer Details (indicate cue type and reason): Slow and labored movement. Use of vertical grab bar for sit to stand from toilet.         Functional mobility during ADLs: Minimal assistance;Rolling  walker (2 wheels);Contact guard assist General ADL Comments: Pt able to functionally ambulate to toilet and sink with RW then to chair.     Communication Communication Communication: No apparent difficulties   Cognition Arousal: Lethargic Behavior During Therapy: WFL for tasks assessed/performed Cognition: No apparent impairments             OT - Cognition Comments: Pt became more lethargic as the session progressed.                 Following commands: Intact        Cueing   Cueing Techniques: Verbal cues, Tactile cues, Visual cues  Exercises Exercises: General Upper Extremity General Exercises - Upper Extremity Shoulder Flexion: AAROM, 5 reps, Both, Seated (x5 protraction as well) Shoulder ABduction: AAROM, Seated, Right (x2 reps before pt fatigued too much to continue.)                 Pertinent Vitals/ Pain       Pain Assessment Pain Assessment: Faces Faces Pain Scale: Hurts even more Pain Location: stomach and legs Pain Descriptors / Indicators: Guarding Pain Intervention(s): Monitored during session, Repositioned                                                          Frequency  Min 3X/week        Progress Toward Goals  OT Goals(current goals can now be found in the care plan section)  Progress towards OT goals: Progressing toward goals  Acute Rehab OT Goals Patient Stated Goal: return home OT Goal Formulation: With patient Time For Goal Achievement: 02/03/24 Potential to Achieve Goals: Good ADL Goals Pt Will Perform Grooming: with supervision;standing Pt Will Perform Lower Body Bathing: with supervision;sitting/lateral leans;with adaptive equipment Pt Will Perform Lower Body Dressing: with supervision;with adaptive equipment;sitting/lateral leans Pt Will Transfer to Toilet: with supervision;stand pivot transfer Pt Will Perform Toileting - Clothing Manipulation and hygiene: with modified  independence;sitting/lateral leans Pt/caregiver will Perform Home Exercise Program: Increased ROM;Increased strength;Right Upper extremity;Left upper extremity;Independently  Plan                                      End of Session Equipment Utilized During Treatment: Gait belt;Rolling walker (2 wheels);Oxygen   OT Visit Diagnosis: Unsteadiness on feet (R26.81);Other abnormalities of gait and mobility (R26.89);Muscle weakness (generalized) (M62.81);History of falling (Z91.81)   Activity Tolerance Patient limited by lethargy   Patient Left in chair;with call bell/phone within reach;with family/visitor present   Nurse Communication Other (comment) (notified pt was in the chair and fatigued)        Time: 8641-8576 OT Time Calculation (min): 25 min  Charges: OT General Charges $OT Visit: 1 Visit OT Treatments $Self Care/Home  Management : 8-22 mins $Therapeutic Exercise: 8-22 mins  [facility] OT, MOT   Jayson Person 01/21/2024, 3:53 PM

## 2024-01-21 NOTE — Progress Notes (Signed)
 Notified of GI PA that the patient is more lethargic this afternoon (change from my exam this morning), labored breathing. Presented to bedside. Patient is in no distress laying on her left side but appears quite somnolent.   Husband reports she was normal last night and now drowsy, not normal right now. She got up to bathroom with slow gait but had no falling, no other trauma. Has had some loose stools today, no bleeding noted. She has not been taking lasix  of late. She complaints of stable right knee/leg pain that is unchanged, bearing weight fine, no numbness or tingling reported. Received oxycontin  20mg  and IV dilaudid  this morning. She endorses dyspnea but no chest pain.   BP 129/76 (BP Location: Left Arm)   Pulse 80   Temp 98.2 F (36.8 C)   Resp 19   Ht 5' 2 (1.575 m)   Wt 81.4 kg   SpO2 96%   BMI 32.82 kg/m   - Afebrile with normal vital signs.  - She is fully oriented and cooperative with an exam without focal deficits. No asterixis or pronator drift.  - Her abdomen, with distraction, is minimally tender, nondistended, with active bowel sounds.  - Expiratory phase is prolonged, though respiratory rate is ~14/min, no retractions. Very diminished, though no definite wheezes appreciated.  - Irreg irreg rate controlled, no MRG.  - RLE with settling ecchymoses, stable from prior exam, compartments are soft, no warmth. No exudate. DP pulses symmetric, SILT.  Lethargy: No focal deficits, though diffuse weakness and drowsiness is abrupt in onset.  - r/o hemorrhage with CT head - Hold narcotic pain medications and benzodiazepine - Check VBG, ammonia.  - Neuro checks q4h  Dyspnea, increased respiratory effort:  - Supplement oxygen  to maintain SpO2 >90%, avoid oversupplementation.  - Check VBG - Check CXR, BNP. She doesn't actually take lasix  regularly, though last CXR showed vascular congestion. We gave IVF initially, we may need to give IV lasix  x1.  - Give empiric albuterol  neb given  diminished breath sounds.   Bernardino Come, MD 01/21/2024 3:37 PM    CRITICAL CARE Performed by: [name]   Total critical care time: 45 minutes  Critical care time was exclusive of separately billable procedures and treating other patients.  Critical care was necessary to treat or prevent imminent or life-threatening deterioration.  Critical care was time spent personally by me on the following activities: development of treatment plan with patient and/or surrogate as well as nursing, discussions with consultants, evaluation of patient's response to treatment, examination of patient, obtaining history from patient or surrogate, ordering and performing treatments and interventions, ordering and review of laboratory studies, ordering and review of radiographic studies, pulse oximetry and re-evaluation of patient's condition.

## 2024-01-21 NOTE — Progress Notes (Signed)
 [facility] PROGRESS NOTE  [name] (DOB: [birth year]) FMW:[id number] PCP: [name], MD  Brief Narrative: Amber Stephenson is a 78 y.o. female with medical history significant of endometrial CA, AFib, CAD, chronic pain, T2DM who presented to the ED early this morning with persistent severe diarrhea and abdominal pain. She and her husband report many episodes of loose stools despite taking imodium  and lomotil  at home the past several days.  She's had trouble taking any food and has been pushing fluids at home, feeling more weak than usual and has had several falls since Sunday. She was recently treated with abx for UTI.    In the ED she was noted to be acutely anemic with +blood clots in stool. Admission requested, GI consulted. Endoscopic evaluation is planned, delayed by need for DOAC washout and hyponatremia.   Subjective: Still with abdominal pain improved with medications, appetite improving, no vomiting. Says magic mouthwash has helped her mouth pain considerably.  Objective: BP (P) 131/70 (BP Location: Right Arm)   Pulse 64   Temp (P) 97.7 F (36.5 C) (Oral)   Resp 18   Ht 5' 2 (1.575 m)   Wt 81.4 kg   SpO2 (P) 97%   BMI 32.82 kg/m   Gen: Frail but not acutely ill-appearing female in no distress resting quietly. HEENT: Left periorbital ecchymosis stable Pulm: Nonlabored, clear CV: Irreg irreg GI: Modestly tender in right side quadrants without rebound or guarding, +BS Neuro: Alert, oriented, nonfocal Ext: Warm and dry Skin: Ecchymoses primarily on RLE stable. No new findings  Assessment & Plan: ABLA due to lower GI bleeding: Hx diverticulosis and diverticulitis (admitted May 2025).  - Continue to trend H/H. Has not yet required transfusion.  - Hold anticoagulation - Endoscopic evaluation planned once Na is 130 and off DOAC > 48 hrs.   Diarrhea: Acutely worsened more chronic problem. Lactic acid level normal, MRI abd/pelvis consistent with diarrheal  illness, few diverticula without inflammation.  - Continuing PPI - GI pathogen panel and C. diff negative >> restarted imodium .    Oral ulcers: No evidence of thrush at this time.  - Magic mouthwash prn, continue.   AKI: Bland sediment with only hyaline casts.  - Resolved with volume repletion.  - Avoid nephrotoxins.   Hyponatremia: Acute on chronic.  - UNa inappropriately elevated, continue salt tabs since she's on clear liquid diet so can't fluid restrict. Now that diarrhea is improved and solute intake improving, will plan for salt tabs to expire tomorrow.  - Continue holding lasix  and HCTZ, likely should avoid duplicate diuretic Tx. Will not continue thiazide going forward and hold both now.  - Hold ARB since normotensive.    Chronic AFib:  - Holding anticoagulation - Continue metoprolol    Endometrial CA: s/p IMRT, boost Tx:  - Follow up with Dr. Shannon   Chronic HFpEF, HTN:  - Holding lasix  40mg  daily in setting of AKI and NPO status. Holding thiazide and ARB as well with AKI.   Chronic pain:  - Continue oxycontin  20mg  BID - Hold NSAID  - Continue gabapentin   - Giving dilaudid  IV prn while NPO   Falls, weakness:  - PT/OT in addition to management as outlined above. She remains quite deconditioned, may require SNF level rehabilitation. Declines this, will order home health PT in preparation for discharge.    CAD:  - Continue BB, statin, hold anticoagulation (not on ASA).    IDT2DM: HbA1c 6.7%.  - SSI, moderate scale - Given CBGs at goal/lower  limit, continue holding basal insulin  but low threshold to restart - Hold home metformin , mounjaro  (BMI 32).    Anxiety:  - Continue abilify , escitalopram , oxcarbazepine , xanax  - hydroxyzine    RLS:  - Continue requip  - Continue sinemet    Chronic hypoxic respiratory failure:  - Continue O2 prn  [name], MD [facility] [email or web address] 01/21/2024, 12:29 PM

## 2024-01-21 NOTE — Progress Notes (Signed)
 Gastroenterology Progress Note   Referring Provider: No ref. provider found Primary Care Physician:  [name], MD Primary Gastroenterologist:  Ozell Hollingshead, MD Patient ID: Amber Stephenson; [id number]; 1946-01-10   Subjective:    Feels worse than yesterday. Husband at bedside. He has noted that she is more lethargic. Breathing is more labored today. She was very weak with getting up to restroom. She states she is so worn out. Tolerating clear liquids. She does not feel she can tolerate bowel prep today. She is concerned about who will complete her procedure stating that she is not wanting Dr. Eartha (personality conflict per patient). She really wants Dr. Hollingshead to complete.   Objective:   Vital signs in last 24 hours: Temp:  [97.7 F (36.5 C)-98.4 F (36.9 C)] (P) 97.7 F (36.5 C) (08/21 0447) Pulse Rate:  [62-64] 64 (08/21 0446) BP: (131-141)/(73-78) (P) 131/70 (08/21 0447) SpO2:  [91 %-99 %] (P) 97 % (08/21 0447) Last BM Date : 01/20/24 General:   Lethargic but arouses. Falls asleep during exam. Use of some accessory muscles during examination, having just come from restroom as well. No acute distress.  Head:  Normocephalic and atraumatic. Eyes:  Sclera clear, no icterus. Left periorbital ecchymosis stable. Chest: Chest with mild crackles in the bases, clear otherwise.     Heart:  Regular rate and rhythm; no murmurs, clicks, rubs,  or gallops. Abdomen:  Soft, tender in the epigastric region mostly, mild diffuse tenderness otherwise. Normal bowel sounds, without guarding, and without rebound.   Extremities:  no edema. Ecchymoses on RLE stable.  Neurologic:  Alert and  oriented x4;  grossly normal neurologically. Skin:  Intact without significant lesions or rashes. Psych:  Alert and cooperative. Normal mood and affect.  Intake/Output from previous day: 08/20 0701 - 08/21 0700 In: 240 [P.O.:240] Out: -  Intake/Output this shift: Total I/O In: 500 [P.O.:500] Out: -    Lab Results: CBC Recent Labs    01/19/24 0607 01/19/24 0749 01/19/24 2007 01/20/24 0409 01/21/24 0613  WBC 5.9  --   --  4.8 5.9  HGB 7.6*   < > 7.6* 7.8* 8.6*  HCT 24.0*   < > 23.9* 24.0* 28.3*  MCV 83.9  --   --  83.3 87.6  PLT 160  --   --  171 184   < > = values in this interval not displayed.   BMET Recent Labs    01/19/24 2007 01/20/24 0409 01/21/24 0613  NA 127* 127* 130*  K 3.8 3.9 4.1  CL 98 99 103  CO2 18* 21* 20*  GLUCOSE 75 85 82  BUN 12 12 9   CREATININE 0.76 0.77 0.72  CALCIUM  7.1* 7.2* 7.2*   LFTs Recent Labs    01/19/24 0607 01/20/24 0409  BILITOT 0.8 1.1  ALKPHOS 136* 138*  AST 30 26  ALT 11 <5  PROT 6.7 6.1*  ALBUMIN  3.3* 3.1*   Recent Labs    01/19/24 0607  LIPASE 21   PT/INR Recent Labs    01/20/24 0409  LABPROT 21.4*  INR 1.8*         Imaging Studies: MR ABDOMEN W WO CONTRAST Result Date: 01/19/2024 CLINICAL DATA:  Acute onset watery diarrhea EXAM: MRI ABDOMEN WITHOUT AND WITH CONTRAST TECHNIQUE: Multiplanar multisequence MR imaging of the abdomen was performed both before and after the administration of intravenous contrast. CONTRAST:  8mL GADAVIST  GADOBUTROL  1 MMOL/ML IV SOLN COMPARISON:  CT abdomen pelvis January 19, 2024, same day MR  pelvis. FINDINGS: Lower chest: Cardiomegaly. No pericardial no pleural fluid. Limited evaluation of the lungs on MRI. Hepatobiliary: Subcentimeter left hepatic lobe cyst . No significant hepatic steatosis. Non cirrhotic morphology. No hepatic biliary dilatation. Gallbladder is slightly distended with questionable sludge/tiny stones. Normal wall thickness. CBD is dilated up to 8 mm proximally and tapers to 4 mm at the pancreatic head. Pancreas: Atrophic changes of the pancreas. No significant pancreatic ductal dilatation or abnormal enhancement. Spleen:  Within normal limits. Adrenals/Urinary Tract: Punctate 4 mm left adrenal postcontrast filling defect may represent a tiny nodule likely benign. Right  kidney interpolar cortical T2 hyperintense cyst which does not require imaging follow-up. No hydronephrosis. Stomach/Bowel: Gaseous distension of the transverse and ascending colon with air-fluid level indicating diarrhea. No bowel obstruction, abnormal enhancement or bowel mass lesion. A few colonic diverticula in the descending colon without definite evidence of diverticulitis. Stomach appears unremarkable. Normal appendix. Vascular/Lymphatic: No suspicious lymphadenopathy. Vascular structures are unremarkable. Musculoskeletal: Scoliosis.  Degenerative changes of the spine. IMPRESSION: Gas and fluid containing distended colon in keeping with history of diarrhea. Few colonic diverticula. No bowel mass , obstruction or wall thickening . Left hepatic lobe cyst. Possible gallbladder sludge. Cardiomegaly. Query punctate left adrenal nodule, too small to characterize. Electronically Signed   By: Megan  Zare M.D.   On: 01/19/2024 20:34   MR PELVIS W WO CONTRAST Result Date: 01/19/2024 CLINICAL DATA:  Severe diarrhea EXAM: MRI PELVIS WITHOUT AND WITH CONTRAST TECHNIQUE: Multiplanar multisequence MR imaging of the pelvis was performed both before and after administration of intravenous contrast. CONTRAST:  8mL GADAVIST  GADOBUTROL  1 MMOL/ML IV SOLN COMPARISON:  Same day CT abdomen pelvis January 19, 2024. FINDINGS: Urinary Tract:  Normal bladder. Bowel: Gas and fluid containing distended transverse colon consistent with known diarrhea. No suspicious bowel lesion within the field of few. Appendix is normal. Vascular/Lymphatic: No suspicious lymphadenopathy. Vascular structures are unremarkable. No aneurysm. Reproductive: Postmenopausal uterus measures 7.8 x 3.1 x 3.8 cm. Normal myometrium. Endometrial thickness measures 1.9 mm. Right ovary is 1.9 x 1 cm. Left ovary measures 1.7 x 1 cm. Other: Bilateral fat containing inguinal hernias right greater than left . Please refer to same day MRI abdomen for further details of the  visceral structures of the abdomen. Musculoskeletal: Subcutaneous T2 hyperintensity along the left anterior abdominal wall correlating to site of fat stranding on prior CT and suggestive of inflammatory changes. No significant fluid collection. Degenerative changes of the spine. IMPRESSION: No suspicious bowel lesion or obstruction. Dilated fluid gas and fluid containing transverse colon in keeping with known diarrhea state. Bilateral fat containing inguinal hernias, right greater than left. Mild inflammatory changes in subcutaneous left ventral abdominal wall. Electronically Signed   By: Megan  Zare M.D.   On: 01/19/2024 20:12   CT ABDOMEN PELVIS WO CONTRAST Result Date: 01/19/2024 CLINICAL DATA:  Abdominal pain, acute, nonlocalized EXAM: CT ABDOMEN AND PELVIS WITHOUT CONTRAST TECHNIQUE: Multidetector CT imaging of the abdomen and pelvis was performed following the standard protocol without IV contrast. RADIATION DOSE REDUCTION: This exam was performed according to the departmental dose-optimization program which includes automated exposure control, adjustment of the mA and/or kV according to patient size and/or use of iterative reconstruction technique. COMPARISON:  CT chest, abdomen, and pelvis dated 01/12/2024. FINDINGS: Lower chest: Chronic bibasilar subpleural reticulation and ground-glass changes. Similar patchy ground-glass nodular densities, most pronounced in the visualized right upper lobe, concerning for multifocal infection, better evaluated on the recent CT chest, abdomen common pelvis dated 01/12/2024. Cardiomegaly. Dense mitral  calcification. Hepatobiliary: No suspicious focal hepatic lesion identified within the limits of an unenhanced exam. Gallbladder is unremarkable. No biliary dilatation. Pancreas: Unremarkable Spleen: Unremarkable Adrenals/Urinary Tract: Stable 1 cm left adrenal nodule, essentially unchanged dating back to 12/26/2019, most compatible with a benign etiology and favored to  represent an adenoma. The right adrenal gland is unremarkable. No urolithiasis or hydronephrosis. Bladder is unremarkable. Stomach/Bowel: Stomach is within normal limits. Appendix appears normal. No obstruction or focal inflammatory changes. Air-fluid levels within the colon, as can be seen in the setting of diarrheal illness. Vascular/Lymphatic: Aortic atherosclerosis. No enlarged abdominal or pelvic lymph nodes. Reproductive: Uterus and bilateral adnexa are unremarkable. Other: No abdominopelvic ascites. No intraperitoneal free air. Fat containing right inguinal hernia. Similar infiltration of the subcutaneous soft tissues of the ventral abdominal pannus. No loculated fluid collection. Musculoskeletal: No acute osseous abnormality. No suspicious osseous lesion. Mild chronic T9, T11, and L2 compression deformities. Degenerative disc changes of the thoracolumbar spine. IMPRESSION: 1. Air-fluid levels within the colon, as can be seen in the setting of diarrheal illness. 2. Similar infiltration of the subcutaneous soft tissues of the ventral abdominal pannus. No loculated fluid collection. Cellulitis not excluded. 3. Chronic interstitial changes of the visualized lung bases with similar patchy ground-glass nodular densities, concerning for multifocal infection and better evaluated on the recent CT chest, abdomen and pelvis dated 01/12/2024. Aortic Atherosclerosis (ICD10-I70.0). Electronically Signed   By: Harrietta Sherry M.D.   On: 01/19/2024 12:55   CT CHEST ABDOMEN PELVIS WO CONTRAST Result Date: 01/12/2024 CLINICAL DATA:  Fall 1 week ago EXAM: CT CHEST, ABDOMEN AND PELVIS WITHOUT CONTRAST TECHNIQUE: Multidetector CT imaging of the chest, abdomen and pelvis was performed following the standard protocol without IV contrast. RADIATION DOSE REDUCTION: This exam was performed according to the departmental dose-optimization program which includes automated exposure control, adjustment of the mA and/or kV according to  patient size and/or use of iterative reconstruction technique. COMPARISON:  CT 12/27/2023, 12/18/2023, 10/23/2023, 01/13/2023 FINDINGS: CT CHEST FINDINGS Cardiovascular: Limited evaluation without intravenous contrast. Mild aortic atherosclerosis. No aneurysm. Dense mitral calcification. Cardiomegaly. No pericardial effusion Mediastinum/Nodes: Patent trachea. No thyroid  mass. Mild mediastinal lymph nodes. Prevascular node measures 13 mm. Right paratracheal nodes measure up to 10 mm. Esophagus within normal limits. Lungs/Pleura: Underlying mild chronic subpleural reticulation and subtle ground-glass disease, consistent with chronic change. More acute multifocal heterogeneous ground-glass densities throughout the bilateral lungs, suspicious for superimposed pneumonia. Scattered small pulmonary nodules measuring up to 5 mm, stable, no specific imaging follow-up is recommended. Musculoskeletal: Sternum appears intact. Right shoulder replacement. No acute rib fracture. See separately dictated spine CT CT ABDOMEN PELVIS FINDINGS Hepatobiliary: No focal liver abnormality is seen. No gallstones, gallbladder wall thickening, or biliary dilatation. Pancreas: Unremarkable. No pancreatic ductal dilatation or surrounding inflammatory changes. Spleen: Normal in size without focal abnormality. Adrenals/Urinary Tract: Adrenal glands are stable in appearance with 1 cm left adrenal nodule, likely an adenoma, no specific imaging follow-up is recommended. Kidneys show no hydronephrosis. The bladder is unremarkable Stomach/Bowel: The stomach is nonenlarged. There is no dilated small bowel. Negative appendix. Large stool burden. Vascular/Lymphatic: Aortic atherosclerosis. No enlarged abdominal or pelvic lymph nodes. Reproductive: Uterus and bilateral adnexa are unremarkable. Other: Negative for pelvic effusion or free air. Infiltration of the subcutaneous soft tissues of the abdominal pannus. Fat containing right groin hernia.  Musculoskeletal: See separately dictated spine CT. No evidence for a pelvic fracture. IMPRESSION: 1. No CT evidence for acute traumatic intrathoracic, intra-abdominal, or intrapelvic abnormality allowing for absence of  intravenous contrast. 2. Underlying chronic lung disease with superimposed multifocal heterogeneous ground-glass densities throughout the bilateral lungs, suspicious for multifocal pneumonia. 3. Mild mediastinal adenopathy, likely reactive. 4. Infiltration of the subcutaneous soft tissues of the abdominal pannus, this could be due to nonspecific edema or cellulitis. Correlate with direct inspection. 5. Aortic atherosclerosis. Aortic Atherosclerosis (ICD10-I70.0). Electronically Signed   By: Luke Bun M.D.   On: 01/12/2024 16:01   CT L-SPINE NO CHARGE Result Date: 01/12/2024 CLINICAL DATA:  Fall. EXAM: CT THORACIC AND LUMBAR SPINE WITHOUT CONTRAST TECHNIQUE: Multiplanar CT images of the thoracic and lumbar spine were reconstructed from contemporary CT of the Chest, Abdomen, and Pelvis. RADIATION DOSE REDUCTION: This exam was performed according to the departmental dose-optimization program which includes automated exposure control, adjustment of the mA and/or kV according to patient size and/or use of iterative reconstruction technique. CONTRAST:  None. COMPARISON:  CT thoracic and lumbar spine 12/27/2023 FINDINGS: CT THORACIC SPINE FINDINGS Alignment: Minimal scoliosis.  No significant listhesis. Vertebrae: Unchanged mild chronic T1, T9, and T11 compression fractures and T12 superior endplate Schmorl's node. No acute fracture or suspicious lesion. Paraspinal and other soft tissues: No acute abnormality in the paraspinal soft tissues. Remainder of the chest reported separately. Disc levels: Mild disc and facet degeneration without evidence of high-grade stenosis. CT LUMBAR SPINE FINDINGS Segmentation: 5 lumbar type vertebrae. Alignment: Mild lumbar levoscoliosis. Unchanged trace retrolisthesis  of L2 on L3. Vertebrae: No acute fracture or suspicious lesion. Unchanged mild chronic L2 superior endplate compression fracture. Degenerative changes and chronic Schmorl's node involving the L4 inferior endplate. Paraspinal and other soft tissues: No acute abnormality in the paraspinal soft tissues. Remainder of the abdomen and pelvis reported separately. Disc levels: Similar appearance of multilevel disc degeneration and facet arthrosis compared to the recent prior CT, including severe facet arthrosis on the right at L2-3 and L3-4 and bilaterally at L4-5 and L5-S1. No evidence of high-grade stenosis. IMPRESSION: 1. No acute fracture in the thoracic or lumbar spine. 2. Unchanged mild chronic thoracic and lumbar compression fractures. Electronically Signed   By: Dasie Hamburg M.D.   On: 01/12/2024 16:00   CT T-SPINE NO CHARGE Result Date: 01/12/2024 CLINICAL DATA:  Fall. EXAM: CT THORACIC AND LUMBAR SPINE WITHOUT CONTRAST TECHNIQUE: Multiplanar CT images of the thoracic and lumbar spine were reconstructed from contemporary CT of the Chest, Abdomen, and Pelvis. RADIATION DOSE REDUCTION: This exam was performed according to the departmental dose-optimization program which includes automated exposure control, adjustment of the mA and/or kV according to patient size and/or use of iterative reconstruction technique. CONTRAST:  None. COMPARISON:  CT thoracic and lumbar spine 12/27/2023 FINDINGS: CT THORACIC SPINE FINDINGS Alignment: Minimal scoliosis.  No significant listhesis. Vertebrae: Unchanged mild chronic T1, T9, and T11 compression fractures and T12 superior endplate Schmorl's node. No acute fracture or suspicious lesion. Paraspinal and other soft tissues: No acute abnormality in the paraspinal soft tissues. Remainder of the chest reported separately. Disc levels: Mild disc and facet degeneration without evidence of high-grade stenosis. CT LUMBAR SPINE FINDINGS Segmentation: 5 lumbar type vertebrae. Alignment:  Mild lumbar levoscoliosis. Unchanged trace retrolisthesis of L2 on L3. Vertebrae: No acute fracture or suspicious lesion. Unchanged mild chronic L2 superior endplate compression fracture. Degenerative changes and chronic Schmorl's node involving the L4 inferior endplate. Paraspinal and other soft tissues: No acute abnormality in the paraspinal soft tissues. Remainder of the abdomen and pelvis reported separately. Disc levels: Similar appearance of multilevel disc degeneration and facet arthrosis compared to the recent prior  CT, including severe facet arthrosis on the right at L2-3 and L3-4 and bilaterally at L4-5 and L5-S1. No evidence of high-grade stenosis. IMPRESSION: 1. No acute fracture in the thoracic or lumbar spine. 2. Unchanged mild chronic thoracic and lumbar compression fractures. Electronically Signed   By: Dasie Hamburg M.D.   On: 01/12/2024 16:00   CT Head Wo Contrast Result Date: 01/12/2024 CLINICAL DATA:  Head trauma, minor (Age >= 65y); Neck trauma (Age >= 65y). Fall today, hitting head on a table with hematoma. On blood thinner. Additional fall 1 week ago. EXAM: CT HEAD WITHOUT CONTRAST CT CERVICAL SPINE WITHOUT CONTRAST TECHNIQUE: Multidetector CT imaging of the head and cervical spine was performed following the standard protocol without intravenous contrast. Multiplanar CT image reconstructions of the cervical spine were also generated. RADIATION DOSE REDUCTION: This exam was performed according to the departmental dose-optimization program which includes automated exposure control, adjustment of the mA and/or kV according to patient size and/or use of iterative reconstruction technique. COMPARISON:  CT head and cervical spine 12/27/2023 FINDINGS: CT HEAD FINDINGS Brain: There is no evidence of an acute infarct, intracranial hemorrhage, mass, midline shift, or extra-axial fluid collection. There is mild generalized cerebral atrophy. Patchy hypodensities in the cerebral white matter are similar  to the prior CT and are nonspecific but compatible with mild-to-moderate chronic small vessel ischemic disease. Vascular: Calcified atherosclerosis at the skull base. No hyperdense vessel. Skull: No acute fracture or suspicious lesion. Sinuses/Orbits: Visualized paranasal sinuses and mastoid air cells are clear. Bilateral cataract extraction. Other: Left temporal scalp hematoma. CT CERVICAL SPINE FINDINGS Alignment: Mild reversal of the normal cervical lordosis. Unchanged trace anterolisthesis of C4 on C5. Skull base and vertebrae: No acute fracture or suspicious lesion. Soft tissues and spinal canal: No prevertebral fluid or swelling. No visible canal hematoma. Disc levels: Moderate cervical spondylosis and facet arthrosis. No evidence of high-grade spinal canal stenosis. Moderate left neural foraminal stenosis at C6-7 due to asymmetric uncovertebral spurring. Upper chest: More fully evaluated on the contemporaneous CT of the chest, abdomen, and pelvis. Other: None. IMPRESSION: 1. No evidence of acute intracranial abnormality. 2. Left temporal scalp hematoma. 3. No acute cervical spine fracture or traumatic malalignment. Electronically Signed   By: Dasie Hamburg M.D.   On: 01/12/2024 15:51   CT Cervical Spine Wo Contrast Result Date: 01/12/2024 CLINICAL DATA:  Head trauma, minor (Age >= 65y); Neck trauma (Age >= 65y). Fall today, hitting head on a table with hematoma. On blood thinner. Additional fall 1 week ago. EXAM: CT HEAD WITHOUT CONTRAST CT CERVICAL SPINE WITHOUT CONTRAST TECHNIQUE: Multidetector CT imaging of the head and cervical spine was performed following the standard protocol without intravenous contrast. Multiplanar CT image reconstructions of the cervical spine were also generated. RADIATION DOSE REDUCTION: This exam was performed according to the departmental dose-optimization program which includes automated exposure control, adjustment of the mA and/or kV according to patient size and/or use of  iterative reconstruction technique. COMPARISON:  CT head and cervical spine 12/27/2023 FINDINGS: CT HEAD FINDINGS Brain: There is no evidence of an acute infarct, intracranial hemorrhage, mass, midline shift, or extra-axial fluid collection. There is mild generalized cerebral atrophy. Patchy hypodensities in the cerebral white matter are similar to the prior CT and are nonspecific but compatible with mild-to-moderate chronic small vessel ischemic disease. Vascular: Calcified atherosclerosis at the skull base. No hyperdense vessel. Skull: No acute fracture or suspicious lesion. Sinuses/Orbits: Visualized paranasal sinuses and mastoid air cells are clear. Bilateral cataract extraction. Other: Left  temporal scalp hematoma. CT CERVICAL SPINE FINDINGS Alignment: Mild reversal of the normal cervical lordosis. Unchanged trace anterolisthesis of C4 on C5. Skull base and vertebrae: No acute fracture or suspicious lesion. Soft tissues and spinal canal: No prevertebral fluid or swelling. No visible canal hematoma. Disc levels: Moderate cervical spondylosis and facet arthrosis. No evidence of high-grade spinal canal stenosis. Moderate left neural foraminal stenosis at C6-7 due to asymmetric uncovertebral spurring. Upper chest: More fully evaluated on the contemporaneous CT of the chest, abdomen, and pelvis. Other: None. IMPRESSION: 1. No evidence of acute intracranial abnormality. 2. Left temporal scalp hematoma. 3. No acute cervical spine fracture or traumatic malalignment. Electronically Signed   By: Dasie Hamburg M.D.   On: 01/12/2024 15:51   DG Si Joints Result Date: 01/12/2024 CLINICAL DATA:  Chronic sacroiliac pain. EXAM: BILATERAL SACROILIAC JOINTS - 3+ VIEW COMPARISON:  None Available. FINDINGS: The sacroiliac joint spaces are maintained. Minor degenerative spurring the sacroiliac joints. No erosions or bony destructive change. Sacral ala are maintained. No other bone abnormalities are seen. IMPRESSION: Minor  degenerative spurring of the sacroiliac joints. Electronically Signed   By: Andrea Gasman M.D.   On: 01/12/2024 14:58   DG Knee 1-2 Views Right Result Date: 01/12/2024 CLINICAL DATA:  Pain and swelling of right knee. EXAM: RIGHT KNEE - 1-2 VIEW COMPARISON:  None Available. FINDINGS: Patient could not tolerate a lateral view. AP only view of the knee submitted. Mild medial tibiofemoral joint space narrowing. Mild peripheral spurring in the tibiofemoral compartments. No evidence of fracture or focal bone abnormality. IMPRESSION: Incomplete exam.  Mild osteoarthritis demonstrated on AP view. Electronically Signed   By: Andrea Gasman M.D.   On: 01/12/2024 14:56   DG Knee Complete 4 Views Right Result Date: 01/12/2024 CLINICAL DATA:  Pain after injury. Fall today. Most of fall week ago. EXAM: RIGHT KNEE - COMPLETE 4+ VIEW COMPARISON:  Frontal view earlier today. FINDINGS: No acute fracture or dislocation. Mild medial tibiofemoral joint space narrowing. Mild tricompartmental peripheral spurring. Large prepatellar soft tissue thickening. Suspect small joint effusion, not well assessed due to positioning on the lateral view. IMPRESSION: 1. Prominent prepatellar soft tissue thickening. No acute fracture or dislocation. 2. Mild tricompartmental osteoarthritis. Electronically Signed   By: Andrea Gasman M.D.   On: 01/12/2024 14:55   DG Chest 2 View Result Date: 12/30/2023 EXAM: 2 VIEW(S) XRAY OF THE CHEST 12/30/2023 07:54:00 AM COMPARISON: 12/28/2023 CLINICAL HISTORY: L 5th rib Fx, concern for developing PNA. Per triage; Pt reports L sided rib pain from previous fall on 7/18. Has been seen 4 times since. Was given RX of narcotics but reports pain is not getting better. Transfers well. AxO VSS FINDINGS: LUNGS AND PLEURA: Retrocardiac atelectasis or infiltrates. No pneumothorax. No pleural effusion. Pulmonary vascular congestion. HEART AND MEDIASTINUM: Stable enlargement of the cardiomediastinal silhouette.  Mitral annular calcification. BONES AND SOFT TISSUES: Known left fifth rib fracture is not well visualized. IMPRESSION: 1. Retrocardiac atelectasis or infiltrates. 2. Stable enlargement of the cardiomediastinal silhouette with mitral annular calcification. 3. Pulmonary vascular congestion. Electronically signed by: Norman Gatlin MD 12/30/2023 08:16 AM EDT RP Workstation: [id number]   DG Chest Port 1 View Result Date: 12/28/2023 CLINICAL DATA:  [name] yesterday, left-sided chest wall pain EXAM: PORTABLE CHEST 1 VIEW COMPARISON:  12/27/2023 FINDINGS: Single frontal view of the chest demonstrates stable enlargement the cardiac silhouette and calcification of the mitral annulus. No acute airspace disease, effusion, or pneumothorax. Right shoulder arthroplasty again noted. There are no acute displaced fractures.  Comparison with chest CT performed yesterday reveals a subtle minimally displaced left anterior fifth rib fracture in retrospect, reference image 85/9. IMPRESSION: 1. Subtle minimally displaced left anterior fifth rib fracture is seen on the comparison CT chest from 12/27/2023 in retrospect. This is not visible on today's x-ray. 2. Otherwise no acute intrathoracic process. 3. Stable enlarged cardiac silhouette and mitral annular calcification. Electronically Signed   By: Ozell Daring M.D.   On: 12/28/2023 14:03   CT Chest Wo Contrast Result Date: 12/27/2023 CLINICAL DATA:  Blunt trauma to the chest. EXAM: CT CHEST WITHOUT CONTRAST TECHNIQUE: Multidetector CT imaging of the chest was performed following the standard protocol without IV contrast. RADIATION DOSE REDUCTION: This exam was performed according to the departmental dose-optimization program which includes automated exposure control, adjustment of the mA and/or kV according to patient size and/or use of iterative reconstruction technique. COMPARISON:  Chest CT dated 06/26/2023. FINDINGS: Evaluation of this exam is limited in the absence of  intravenous contrast. Cardiovascular: There is mild cardiomegaly. No pericardial effusion. There is calcification of the mitral annulus. Mild atherosclerotic calcification of the thoracic aorta. No aneurysm or dilatation. The central pulmonary arteries are grossly unremarkable. Mediastinum/Nodes: No hilar or mediastinal adenopathy. The esophagus is grossly unremarkable. No mediastinal fluid collection. Lungs/Pleura: There is chronic interstitial coarsening. Clusters of ground-glass density in the right lower lobe new since the prior CT and may represent developing infiltrate. Right upper lobe interstitial nodularity slightly progressed since the prior CT. No consolidative changes. There is no pleural effusion or pneumothorax. The central airways are patent. Upper Abdomen: No acute abnormality. Musculoskeletal: Right shoulder arthroplasty. No acute osseous pathology. Osteopenia with degenerative changes. IMPRESSION: 1. No acute/traumatic intrathoracic pathology. 2. Clusters of ground-glass density in the right lower lobe new since the prior CT and may represent developing infiltrate. 3.  Aortic Atherosclerosis (ICD10-I70.0). Electronically Signed   By: Vanetta Chou M.D.   On: 12/27/2023 11:24   CT T-SPINE NO CHARGE Result Date: 12/27/2023 CLINICAL DATA:  Trauma.  Fall. EXAM: CT THORACIC SPINE WITHOUT CONTRAST TECHNIQUE: Multiplanar CT images of the thoracic spine were reconstructed from contemporary CT of the Chest. RADIATION DOSE REDUCTION: This exam was performed according to the departmental dose-optimization program which includes automated exposure control, adjustment of the mA and/or kV according to patient size and/or use of iterative reconstruction technique. CONTRAST:  None. COMPARISON:  CT thoracic spine 11/11/2021. CTA chest, abdomen, and pelvis 06/26/2023. FINDINGS: Alignment: Minimal scoliosis.  No significant listhesis. Vertebrae: Unchanged mild T1, T9, and T11 compression fractures and T12  superior endplate Schmorl's node. No acute fracture or suspicious lesion. Paraspinal and other soft tissues: No acute abnormality in the paraspinal soft tissues. Remainder of the chest reported separately. Disc levels: Mild disc and facet degeneration without evidence of high-grade stenosis. IMPRESSION: 1. No acute osseous abnormality in the thoracic spine. 2. Unchanged multiple mild chronic compression fractures. Electronically Signed   By: Dasie Hamburg M.D.   On: 12/27/2023 11:15   CT Cervical Spine Wo Contrast Result Date: 12/27/2023 EXAM: CT CERVICAL SPINE WITHOUT CONTRAST 12/27/2023 10:47:25 AM TECHNIQUE: CT of the cervical spine was performed without the administration of intravenous contrast. Multiplanar reformatted images are provided for review. Automated exposure control, iterative reconstruction, and/or weight based adjustment of the mA/kV was utilized to reduce the radiation dose to as low as reasonably achievable. COMPARISON: CT of the cervical spine dated 12/18/2023. CLINICAL HISTORY: Polytrauma, blunt. Trauma; from home for fall that happened around 12 am, fell going to the  bathroom, Pt says she doesn't know what cause fall. Endorses hitting the back of her head on the floor, denies LOC. Pt endorses rib pain, chest pain and SOB. FINDINGS: CERVICAL SPINE: BONES AND ALIGNMENT: No acute fracture or traumatic malalignment. Straightening and slight reversal of the normal cervical lordosis is stable. DEGENERATIVE CHANGES: Facet degenerative changes are somewhat more prominent on the left. Uncovertebral spurring is greatest at C6-7, left greater than right. SOFT TISSUES: No prevertebral soft tissue swelling. IMPRESSION: 1. No acute abnormality of the cervical spine related to the reported trauma. 2. Stable straightening and slight reversal of the normal cervical lordosis. 3. Facet degenerative changes, somewhat more prominent on the left. 4. Uncovertebral spurring, greatest at C6-7, left greater than  right. Electronically signed by: Lonni Necessary MD 12/27/2023 11:12 AM EDT RP Workstation: [id number]   CT Lumbar Spine Wo Contrast Result Date: 12/27/2023 CLINICAL DATA:  Low back pain, trauma.  Fall. EXAM: CT LUMBAR SPINE WITHOUT CONTRAST TECHNIQUE: Multidetector CT imaging of the lumbar spine was performed without intravenous contrast administration. Multiplanar CT image reconstructions were also generated. RADIATION DOSE REDUCTION: This exam was performed according to the departmental dose-optimization program which includes automated exposure control, adjustment of the mA and/or kV according to patient size and/or use of iterative reconstruction technique. COMPARISON:  CT abdomen and pelvis 12/25/2023. CT lumbar spine 11/11/2021. FINDINGS: Segmentation: 5 lumbar type vertebrae. Alignment: Mild lumbar levoscoliosis. Unchanged trace retrolisthesis of L2 on L3. Vertebrae: Unchanged mild chronic L2 superior endplate compression fracture. Degenerative endplate changes at L4-5 including unchanged L4 inferior endplate Schmorl's node. No acute fracture or suspicious lesion. Paraspinal and other soft tissues: Abdominal aortic atherosclerosis without aneurysm. Disc levels: Mild disc space narrowing at L1-2, L3-4, and L4-5. Predominantly mild disc bulging throughout the lumbar spine. Severe facet arthrosis on the right at L2-3 and L3-4 and bilaterally at L4-5 and L5-S1. No evidence of high-grade spinal canal or neural foraminal stenosis. IMPRESSION: 1. No acute osseous abnormality. 2. Unchanged mild chronic L2 compression fracture. 3. Lumbar disc and facet degeneration without evidence of high-grade stenosis. 4.  Aortic Atherosclerosis (ICD10-I70.0). Electronically Signed   By: Dasie Hamburg M.D.   On: 12/27/2023 11:08   CT Head Wo Contrast Result Date: 12/27/2023 EXAM: CT HEAD WITHOUT CONTRAST 12/27/2023 10:47:25 AM TECHNIQUE: CT of the head was performed without the administration of intravenous contrast.  Automated exposure control, iterative reconstruction, and/or weight based adjustment of the mA/kV was utilized to reduce the radiation dose to as low as reasonably achievable. COMPARISON: None available. CLINICAL HISTORY: Polytrauma, blunt. Trauma; from home for fall that happened around 12 am, fell going to the bathroom, Pt says she doesn't know what cause fall. Endorses hitting the back of her head on the floor, denies LOC. Pt endorses rib pain, chest pain and SOB. FINDINGS: BRAIN AND VENTRICLES: No acute hemorrhage. Gray-white differentiation is preserved. No hydrocephalus. No extra-axial collection. No mass effect or midline shift. Moderate periventricular subcortical white matter hypoattenuation is similar to the prior exam. Mild generalized atrophy is again noted. ORBITS: Bilateral lens replacements are noted. The globes and orbits are otherwise within normal limits. SINUSES: No acute abnormality. SOFT TISSUES AND SKULL: No acute soft tissue abnormality. No skull fracture. IMPRESSION: 1. No acute intracranial abnormality related to the reported trauma. 2. Moderate periventricular subcortical white matter hypoattenuation and mild generalized atrophy, similar to the prior exam. This likely reflects the sequelae of chronic microvascular ischemia. Electronically signed by: Lonni Necessary MD 12/27/2023 11:03 AM EDT RP Workstation: [id number]  DG Chest Portable 1 View Result Date: 12/27/2023 CLINICAL DATA:  Fall and left chest wall pain. EXAM: PORTABLE CHEST 1 VIEW COMPARISON:  Chest radiograph dated 12/18/2023. FINDINGS: No focal consolidation, pleural effusion, or pneumothorax. Mild cardiomegaly. No acute osseous pathology. Right shoulder arthroplasty. IMPRESSION: 1. No active disease. 2. Mild cardiomegaly. Electronically Signed   By: Vanetta Chou M.D.   On: 12/27/2023 10:44   CT ABDOMEN PELVIS WO CONTRAST Result Date: 12/25/2023 CLINICAL DATA:  Abdominal pain EXAM: CT ABDOMEN AND PELVIS WITHOUT  CONTRAST TECHNIQUE: Multidetector CT imaging of the abdomen and pelvis was performed following the standard protocol without IV contrast. RADIATION DOSE REDUCTION: This exam was performed according to the departmental dose-optimization program which includes automated exposure control, adjustment of the mA and/or kV according to patient size and/or use of iterative reconstruction technique. COMPARISON:  CT 12/18/2023, 10/23/2023, 10/12/2023 FINDINGS: Lower chest: Lung bases demonstrate incompletely visualized heterogeneous airspace disease in the right middle and lower lobes. Cardiomegaly with mitral annular calcification. Coronary vascular calcification. Hepatobiliary: No focal liver abnormality is seen. No gallstones, gallbladder wall thickening, or biliary dilatation. Pancreas: Unremarkable. No pancreatic ductal dilatation or surrounding inflammatory changes. Spleen: Normal in size without focal abnormality. Adrenals/Urinary Tract: Adrenal glands are normal. Kidneys show no hydronephrosis. Bladder shows possible slight thick wall and minimal perivesical stranding. Stomach/Bowel: Stomach nonenlarged. No dilated small bowel. Negative appendix. Diverticular disease of the sigmoid colon. Question very subtle fat stranding in the fat adjacent to sigmoid colon, series 5, image 49. And series 2, image 69. Questioned area of sigmoid colon narrowing is less apparent in the absence of contrast, may be visualized on series 2, image 74. Vascular/Lymphatic: Aortic atherosclerosis. No enlarged abdominal or pelvic lymph nodes. Reproductive: Uterus and bilateral adnexa are unremarkable. Other: Negative for pelvic effusion or free air. Fat containing right groin hernia Musculoskeletal: No acute or suspicious osseous abnormality. Mild chronic superior endplate deformities T11 and T12 as well as inferior endplate of T9. IMPRESSION: 1. Diverticular disease of the sigmoid colon. Question very subtle fat stranding in the fat adjacent  to the sigmoid colon, cannot exclude very mild acute diverticulitis. Questioned area of sigmoid colon narrowing is less apparent in the absence of contrast, recommend correlation with colonoscopy if not already performed. 2. Possible slight thick wall of the bladder with minimal perivesical stranding, correlate with urinalysis to exclude cystitis. 3. Incompletely visualized heterogeneous airspace disease in the right middle and lower lobes, suspect for pneumonia, possible atypical organism. 4. Cardiomegaly. 5. Aortic atherosclerosis. Aortic Atherosclerosis (ICD10-I70.0). Electronically Signed   By: Luke Bun M.D.   On: 12/25/2023 19:36  [2 weeks]  Assessment:   78 year old female with endometrial cancer medically inoperable, s/p IMRT, Afib on Eliquis , bipolar 1 disorder, CAD, CHF, chronic pain, DM, HTN, Herpes, NAFLD, OSA, osteopenia, RLS, pulmonary hypertension and previous history of microscopic colitis (diagnosed at outside facility, not on therapy), who came to the hospital with diarrhea, abdominal pain and rectal bleeding.    Abdominal Pain: -New onset, generalized worse in RLQ initially though more diffuse today also with epigastric tenderness more focal  -CT A/P without contrast with no findings to explain symptoms, air fluid levels within colon, similar similar infiltration of the subcutaneous soft tissue of the ventral abdominal pannus with no loculated fluid collection. Etiology is unclear.   -MR abd/pelvis w wo showed Gas and fluid containing distended colon in keeping with history of diarrhea. Few colonic diverticula. No bowel mass, obstruction or wall thickening . Left hepatic lobe cyst.  Possible gallbladder sludge/slight gb distention, normal wall thickness.     Diarrhea: -acute onset x4 days ago -antibiotics recently for UTI, though C diff negative -GI pathogen panel negative  -history of microscopic colitis -history of abdominal radiation for endometrial cancer   Cannot  rule out microscopic colitis or even radiation induced colitis/proctitis, planning for colonoscopy while inpatient once sodium improved    Anemia/rectal bleeding:  -hgb declining since may -hgb 7.6 on admission, 8.6 today, no transfusions this admission -ongoing intermittent rectal bleeding over the past year, worse since completing radiation for endometrial cancer -denies NSAIDs, diclofenac  on outpatient med list -no previous EGD on file -Last TCS 2018 with tubular adenoma, hemorrhoids, microscopic colitis  -scheduled for EGD/Colonoscopy as outpatient on 8/29, but will pursue as inpatient, once sodium improved. ELIQUIS  will need to be held x48 hours prior to procedures (currently on hold)   Lethargy/weakness/increased shortness of breath: -per spouse, she was doing better yesterday, much more alert and normal. Breathing easier did not require oxygen . Now more labored breathing.  -vital signs are stable. She is requiring 2L [place] with sat 98%. -Hgb stable -Na up to 130 -received oxycodone  833 and dilaudid  at 918, typically receiving more spaced out.  -to notify attending, consider chest xray initially      Plan:   Continue clear liquids I don't think she would tolerate bowel prep today given acute changes, patient and husband agree.  To discuss with Dr. Bryn.  Of note, patient prefers Dr. Shaaron to complete her procedures.    LOS: 2 days   Sonny RAMAN. Ezzard RIGGERS [facility] [phone] 8/21/20251:05 PM

## 2024-01-21 NOTE — Patient Instructions (Signed)
 Perform each exercise ____10-15____ reps. 2-3x days.   1) Protraction   Start by holding a wand or cane at chest height.  Next, slowly push the wand outwards in front of your body so that your elbows become fully straightened. Then, return to the original position.     2) Shoulder FLEXION   In the standing position, hold a wand/cane with both arms, palms down on both sides. Raise up the wand/cane allowing your unaffected arm to perform most of the effort. Your affected arm should be partially relaxed.      3) Internal/External ROTATION   In the standing position, hold a wand/cane with both hands keeping your elbows bent. Move your arms and wand/cane to one side.  Your affected arm should be partially relaxed while your unaffected arm performs most of the effort.       4) Shoulder ABDUCTION   While holding a wand/cane palm face up on the injured side and palm face down on the uninjured side, slowly raise up your injured arm to the side.        5) Horizontal Abduction/Adduction      Straight arms holding cane at shoulder height, bring cane to right, center, left. Repeat starting to left.   Copyright  [employer]. All rights reserved.

## 2024-01-21 NOTE — Plan of Care (Signed)
  Problem: Education: Goal: Knowledge of General Education information will improve Description: Including pain rating scale, medication(s)/side effects and non-pharmacologic comfort measures Outcome: Progressing   Problem: Health Behavior/Discharge Planning: Goal: Ability to manage health-related needs will improve Outcome: Progressing   Problem: Clinical Measurements: Goal: Ability to maintain clinical measurements within normal limits will improve Outcome: Progressing Goal: Will remain free from infection Outcome: Progressing Goal: Diagnostic test results will improve Outcome: Progressing Goal: Respiratory complications will improve Outcome: Progressing Goal: Cardiovascular complication will be avoided Outcome: Progressing   Problem: Activity: Goal: Risk for activity intolerance will decrease Outcome: Progressing   Problem: Nutrition: Goal: Adequate nutrition will be maintained Outcome: Progressing   Problem: Coping: Goal: Level of anxiety will decrease Outcome: Progressing   Problem: Elimination: Goal: Will not experience complications related to bowel motility Outcome: Progressing Goal: Will not experience complications related to urinary retention Outcome: Progressing   Problem: Pain Managment: Goal: General experience of comfort will improve and/or be controlled Outcome: Progressing   Problem: Safety: Goal: Ability to remain free from injury will improve Outcome: Progressing   Problem: Skin Integrity: Goal: Risk for impaired skin integrity will decrease Outcome: Progressing   Problem: Education: Goal: Ability to describe self-care measures that may prevent or decrease complications (Diabetes Survival Skills Education) will improve Outcome: Progressing Goal: Individualized Educational Video(s) Outcome: Progressing   Problem: Coping: Goal: Ability to adjust to condition or change in health will improve Outcome: Progressing   Problem: Fluid  Volume: Goal: Ability to maintain a balanced intake and output will improve Outcome: Progressing   Problem: Health Behavior/Discharge Planning: Goal: Ability to identify and utilize available resources and services will improve Outcome: Progressing Goal: Ability to manage health-related needs will improve Outcome: Progressing   Problem: Metabolic: Goal: Ability to maintain appropriate glucose levels will improve Outcome: Progressing   Problem: Nutritional: Goal: Maintenance of adequate nutrition will improve Outcome: Progressing Goal: Progress toward achieving an optimal weight will improve Outcome: Progressing   Problem: Skin Integrity: Goal: Risk for impaired skin integrity will decrease Outcome: Progressing   Problem: Tissue Perfusion: Goal: Adequacy of tissue perfusion will improve Outcome: Progressing   Problem: Acute Rehab OT Goals (only OT should resolve) Goal: Pt. Will Perform Grooming Outcome: Progressing Goal: Pt. Will Perform Lower Body Bathing Outcome: Progressing Goal: Pt. Will Perform Lower Body Dressing Outcome: Progressing Goal: Pt. Will Transfer To Toilet Outcome: Progressing Goal: Pt. Will Perform Toileting-Clothing Manipulation Outcome: Progressing Goal: Pt/Caregiver Will Perform Home Exercise Program Outcome: Progressing   Problem: Acute Rehab PT Goals(only PT should resolve) Goal: Pt Will Go Supine/Side To Sit Outcome: Progressing Goal: Patient Will Transfer Sit To/From Stand Outcome: Progressing Goal: Pt Will Transfer Bed To Chair/Chair To Bed Outcome: Progressing Goal: Pt Will Ambulate Outcome: Progressing

## 2024-01-22 ENCOUNTER — Telehealth: Payer: Self-pay | Admitting: Gastroenterology

## 2024-01-22 ENCOUNTER — Encounter (HOSPITAL_COMMUNITY)
Admission: EM | Disposition: A | Discharge status: Home Health | Payer: Self-pay | Source: Home / Self Care | Attending: Family Medicine

## 2024-01-22 ENCOUNTER — Inpatient Hospital Stay (HOSPITAL_COMMUNITY): Admitting: Certified Registered Nurse Anesthetist

## 2024-01-22 ENCOUNTER — Encounter (HOSPITAL_COMMUNITY): Payer: Self-pay | Admitting: Family Medicine

## 2024-01-22 DIAGNOSIS — K259 Gastric ulcer, unspecified as acute or chronic, without hemorrhage or perforation: Secondary | ICD-10-CM

## 2024-01-22 DIAGNOSIS — D62 Acute posthemorrhagic anemia: Secondary | ICD-10-CM | POA: Diagnosis not present

## 2024-01-22 DIAGNOSIS — K625 Hemorrhage of anus and rectum: Secondary | ICD-10-CM | POA: Diagnosis not present

## 2024-01-22 DIAGNOSIS — K5710 Diverticulosis of small intestine without perforation or abscess without bleeding: Secondary | ICD-10-CM | POA: Diagnosis not present

## 2024-01-22 DIAGNOSIS — K5730 Diverticulosis of large intestine without perforation or abscess without bleeding: Secondary | ICD-10-CM | POA: Diagnosis not present

## 2024-01-22 DIAGNOSIS — I10 Essential (primary) hypertension: Secondary | ICD-10-CM

## 2024-01-22 DIAGNOSIS — D123 Benign neoplasm of transverse colon: Secondary | ICD-10-CM | POA: Diagnosis not present

## 2024-01-22 DIAGNOSIS — K5520 Angiodysplasia of colon without hemorrhage: Secondary | ICD-10-CM | POA: Diagnosis not present

## 2024-01-22 DIAGNOSIS — I2510 Atherosclerotic heart disease of native coronary artery without angina pectoris: Secondary | ICD-10-CM

## 2024-01-22 DIAGNOSIS — K635 Polyp of colon: Secondary | ICD-10-CM

## 2024-01-22 HISTORY — PX: COLONOSCOPY: SHX5424

## 2024-01-22 HISTORY — PX: ESOPHAGOGASTRODUODENOSCOPY: SHX5428

## 2024-01-22 LAB — BASIC METABOLIC PANEL WITH GFR
Anion gap: 9 (ref 5–15)
BUN: 8 mg/dL (ref 8–23)
CO2: 22 mmol/L (ref 22–32)
Calcium: 7.3 mg/dL — ABNORMAL LOW (ref 8.9–10.3)
Chloride: 99 mmol/L (ref 98–111)
Creatinine, Ser: 0.66 mg/dL (ref 0.44–1.00)
GFR, Estimated: >60 mL/min (ref >60)
Glucose, Bld: 100 mg/dL — ABNORMAL HIGH (ref 70–99)
Potassium: 4 mmol/L (ref 3.5–5.1)
Sodium: 130 mmol/L — ABNORMAL LOW (ref 135–145)

## 2024-01-22 LAB — GLUCOSE, CAPILLARY
Glucose-Capillary: 124 mg/dL — ABNORMAL HIGH (ref 70–99)
Glucose-Capillary: 143 mg/dL — ABNORMAL HIGH (ref 70–99)
Glucose-Capillary: 78 mg/dL (ref 70–99)
Glucose-Capillary: 80 mg/dL (ref 70–99)
Glucose-Capillary: 83 mg/dL (ref 70–99)
Glucose-Capillary: 86 mg/dL (ref 70–99)
Glucose-Capillary: 95 mg/dL (ref 70–99)

## 2024-01-22 LAB — HEMOGLOBIN AND HEMATOCRIT, BLOOD
HCT: 24.2 % — ABNORMAL LOW (ref 36.0–46.0)
Hemoglobin: 7.6 g/dL — ABNORMAL LOW (ref 12.0–15.0)

## 2024-01-22 SURGERY — EGD (ESOPHAGOGASTRODUODENOSCOPY)
Anesthesia: Monitor Anesthesia Care

## 2024-01-22 MED ORDER — GLYCOPYRROLATE PF 0.2 MG/ML IJ SOSY
PREFILLED_SYRINGE | INTRAMUSCULAR | Status: AC
Start: 2024-01-22 — End: 2024-01-22
  Filled 2024-01-22: qty 1

## 2024-01-22 MED ORDER — PROPOFOL 10 MG/ML IV BOLUS
INTRAVENOUS | Status: AC
  Filled 2024-01-22: qty 20

## 2024-01-22 MED ORDER — PROPOFOL 10 MG/ML IV BOLUS
INTRAVENOUS | Status: AC
Start: 2024-01-22 — End: 2024-01-22
  Filled 2024-01-22: qty 20

## 2024-01-22 MED ORDER — LACTATED RINGERS IV SOLN: INTRAVENOUS | Status: DC

## 2024-01-22 MED ORDER — GLYCOPYRROLATE 0.2 MG/ML IJ SOLN
INTRAMUSCULAR | Status: DC | PRN
  Administered 2024-01-22: .1 mg via INTRAVENOUS

## 2024-01-22 MED ORDER — PANTOPRAZOLE SODIUM 40 MG PO TBEC
40.0000 mg | DELAYED_RELEASE_TABLET | Freq: Two times a day (BID) | ORAL | Status: DC
  Administered 2024-01-22 – 2024-01-23 (×2): 40 mg via ORAL
  Filled 2024-01-22 (×3): qty 1

## 2024-01-22 MED ORDER — PHENYLEPHRINE HCL (PRESSORS) 10 MG/ML IV SOLN
INTRAVENOUS | Status: DC | PRN
  Administered 2024-01-22 (×8): 100 ug via INTRAVENOUS

## 2024-01-22 MED ORDER — APIXABAN 5 MG PO TABS
5.0000 mg | ORAL_TABLET | Freq: Two times a day (BID) | ORAL | Status: DC
  Administered 2024-01-22 – 2024-01-23 (×2): 5 mg via ORAL
  Filled 2024-01-22 (×2): qty 1

## 2024-01-22 MED ORDER — PROPOFOL 10 MG/ML IV BOLUS
INTRAVENOUS | Status: DC | PRN
  Administered 2024-01-22: 25 mg via INTRAVENOUS
  Administered 2024-01-22: 50 mg via INTRAVENOUS
  Administered 2024-01-22: 25 mg via INTRAVENOUS
  Administered 2024-01-22 (×2): 50 mg via INTRAVENOUS
  Administered 2024-01-22: 75 mg via INTRAVENOUS
  Administered 2024-01-22: 25 mg via INTRAVENOUS

## 2024-01-22 MED ORDER — PHENYLEPHRINE 80 MCG/ML (10ML) SYRINGE FOR IV PUSH (FOR BLOOD PRESSURE SUPPORT)
PREFILLED_SYRINGE | INTRAVENOUS | Status: AC
  Filled 2024-01-22: qty 10

## 2024-01-22 NOTE — Telephone Encounter (Signed)
 Please cancel her outpatient procedures. Recently completed inpatient.

## 2024-01-22 NOTE — Brief Op Note (Signed)
 01/19/2024 - 01/22/2024  2:04 PM  PATIENT:  Amber Stephenson  78 y.o. female  PRE-OPERATIVE DIAGNOSIS:  anemia , hematochezia  POST-OPERATIVE DIAGNOSIS:  EGD:  gastric,ulcers;  PROCEDURE:  Procedure(s): EGD (ESOPHAGOGASTRODUODENOSCOPY) (N/A) COLONOSCOPY (N/A)  SURGEON:  Surgeons and Role:    * Eartha Angelia Sieving, MD - Primary  Patient underwent EGD AND COLONOSCOPY under propofol  sedation.  Tolerated the procedure adequately.   EGD FINDINGS: - Normal esophagus.  - Non-bleeding gastric ulcers with a clean ulcer base (Forrest Class III).  Biopsied.  - Non-bleeding duodenal diverticulum.   COLONOSCOPY FINDINGS: - Two 3 to 4 mm polyps in the transverse colon, removed with a cold snare.  Resected and retrieved.  - Multiple non-bleeding colonic angiodysplastic lesions.  Treated with argon plasma coagulation (APC).  - Diverticulosis in the sigmoid colon.  - The distal rectum and anal verge are normal on retroflexion view.   RECOMMENDATIONS - Return patient to hospital ward for ongoing care.  - Resume previous diet.  - Await pathology results.  - Repeat colonoscopy for surveillance based on pathology results.  - Use Protonix  (pantoprazole ) 40 mg PO BID.  - No ibuprofen , naproxen, or other non-steroidal anti-inflammatory drugs.  - Restart Eliquis  tonight.  Sieving Eartha, MD Gastroenterology and Hepatology [facility]

## 2024-01-22 NOTE — Progress Notes (Signed)
 Patient transferred to the floor.  John RN met in room.  Patient in NAD.

## 2024-01-22 NOTE — Plan of Care (Signed)

## 2024-01-22 NOTE — Progress Notes (Signed)
 Bowel Prep finished prior to midnight. Stool is clear at this time.

## 2024-01-22 NOTE — Progress Notes (Signed)
 [facility] PROGRESS NOTE  Amber Stephenson (DOB: 05/04/1946) FMW:[id number] PCP: [name], MD  Brief Narrative: Amber Stephenson is a 78 y.o. female with medical history significant of endometrial CA, AFib, CAD, chronic pain, T2DM who presented to the ED early this morning with persistent severe diarrhea and abdominal pain. She and her husband report many episodes of loose stools despite taking imodium  and lomotil  at home the past several days.  She's had trouble taking any food and has been pushing fluids at home, feeling more weak than usual and has had several falls since Sunday. She was recently treated with abx for UTI.    In the ED she was noted to be acutely anemic with +blood clots in stool. Admission requested, GI consulted. Endoscopic evaluation is planned, delayed by need for DOAC washout and hyponatremia.   Subjective: - Resting comfortably, husband at bedside - Tolerated EGD and colonoscopy well - Hungry  Objective: BP (!) 98/59 (BP Location: Left Arm)   Pulse 95   Temp 98.3 F (36.8 C)   Resp 17   Ht 5' 2 (1.575 m)   Wt 81.4 kg   SpO2 98%   BMI 32.82 kg/m     Physical Exam Gen:- Awake Alert, in no acute distress  HEENT:- [place].AT, No sclera icterus Neck-Supple Neck,No JVD,.  Lungs-  CTAB , fair air movement bilaterally  CV- S1, S2 normal, irregular Abd-  +ve B.Sounds, Abd Soft, mild epigastric tenderness without rebound or guarding Extremity/Skin:- No  edema,   good pedal pulses  Psych-affect is appropriate, oriented x3 Neuro-no new focal deficits, no tremors   Assessment & Plan: 1) acute on chronic anemia due to acute blood loss in the setting of GI bleed -Hgb was 11.7 on 10/09/2023 and 11.2 on 10/10/2023 -Hgb on admission on 01/19/2024 was 7.6 -Hgb remains at 7.6 on 01/22/2024 - Patient underwent EGD and colonoscopy on 01/22/2024 with findings as noted below:- EGD FINDINGS: - Normal esophagus.  - Non-bleeding gastric ulcers with a clean ulcer base  (Forrest Class III).  Non-bleeding duodenal diverticulum.  COLONOSCOPY FINDINGS: - Two 3 to 4 mm polyps in the transverse colon, removed with a cold snare. Resected and retrieved.  - Multiple non-bleeding colonic angiodysplastic lesions. Treated with argon plasma coagulation (APC).  - Per GI team continue to avoid NSAIDs - Change Protonix  to 40 twice daily - Await pathology results - Okay to restart Eliquis  p.m. of 01/22/2024  .  2)Diarrhea: Acutely worsened more chronic problem. Lactic acid level normal, MRI abd/pelvis consistent with diarrheal illness, few diverticula without inflammation.  - Continuing PPI - GI pathogen panel and C. diff negative >> restarted imodium .  - Colonoscopy as noted above  3)AKI----acute kidney injury partly due to #2 above  creatinine on admission= 1.22  ,  -baseline creatinine = 0.76    , - creatinine is now= 0.66  ,  --renally adjust medications, avoid nephrotoxic agents / dehydration  / hypotension   4) acute on chronic hyponatremia---sodium is currently around 130 - continue to hold Lasix  and  hydrochlorothiazide  -- Restart diet - Avoid excessive free water    5)Chronic AFib:  -Restart Eliquis  p.m. of 01/22/2024 per GI team - Continue metoprolol  for rate control  6)Endometrial CA: s/p IMRT, boost Tx:  - Follow up with Dr. Shannon   7)Chronic HFpEF, HTN:  - Echo from 01/20/2023 with EF of 50 to 55%, -Continue to hold Lasix  and HCTZ as above   Chronic pain:  - Continue oxycontin  20mg  BID -  Hold NSAID  - Continue gabapentin    Falls, weakness:  - PT/OT in addition to management as outlined above. She remains quite deconditioned, may require SNF level rehabilitation. Declines this, will order home health PT in preparation for discharge.    CAD:  -  aspirin  due to need for Eliquis  for A-fib, chest pain-free, not on -Continue metoprolol  and atorvastatin    IDT2DM: HbA1c 6.7%.  Use Novolog /Humalog  Sliding scale insulin  with  Accu-Cheks/Fingersticks as ordered  - Hold home metformin , mounjaro  (BMI 32).    Anxiety:  - Continue abilify , escitalopram , oxcarbazepine , xanax  - hydroxyzine    RLS:  - Continue requip  - Continue sinemet    Chronic hypoxic respiratory failure:  - Continue O2 prn  Rendall Carwin, MD [facility] [email or web address] 01/22/2024, 3:01 PM

## 2024-01-22 NOTE — Transfer of Care (Signed)
 Immediate Anesthesia Transfer of Care Note  Patient: Amber Stephenson  Procedure(s) Performed: EGD (ESOPHAGOGASTRODUODENOSCOPY) COLONOSCOPY  Patient Location: PACU  Anesthesia Type:MAC  Level of Consciousness: drowsy and lethargic  Airway & Oxygen  Therapy: Patient Spontanous Breathing and Patient connected to nasal cannula oxygen   Post-op Assessment: Report given to RN  Post vital signs: Reviewed and stable  Last Vitals:  Vitals Value Taken Time  BP 98/59 01/22/24 14:45  Temp 36.8 C 01/22/24 14:45  Pulse    Resp 17 01/22/24 14:47  SpO2    Vitals shown include unfiled device data.  Last Pain:  Vitals:   01/22/24 1336  TempSrc:   PainSc: 8       Patients Stated Pain Goal: 7 (01/22/24 1336)  Complications: No notable events documented.

## 2024-01-22 NOTE — Anesthesia Preprocedure Evaluation (Signed)
 Anesthesia Evaluation  Patient identified by MRN, date of birth, ID band Patient awake    Reviewed: Allergy & Precautions, H&P , NPO status , Patient's Chart, lab work & pertinent test results, reviewed documented beta blocker date and time   Airway Mallampati: II  TM Distance: >3 FB Neck ROM: full    Dental no notable dental hx.    Pulmonary asthma , sleep apnea    Pulmonary exam normal breath sounds clear to auscultation       Cardiovascular Exercise Tolerance: Good hypertension, + CAD and +CHF   Rhythm:regular Rate:Normal     Neuro/Psych  Headaches PSYCHIATRIC DISORDERS Anxiety Depression Bipolar Disorder    Neuromuscular disease    GI/Hepatic negative GI ROS, Neg liver ROS,,,  Endo/Other  diabetes    Renal/GU negative Renal ROS  negative genitourinary   Musculoskeletal   Abdominal   Peds  Hematology  (+) Blood dyscrasia, anemia   Anesthesia Other Findings   Reproductive/Obstetrics negative OB ROS                              Anesthesia Physical Anesthesia Plan  ASA: 4 and emergent  Anesthesia Plan: General   Post-op Pain Management:    Induction:   PONV Risk Score and Plan: Propofol  infusion  Airway Management Planned:   Additional Equipment:   Intra-op Plan:   Post-operative Plan:   Informed Consent: I have reviewed the patients History and Physical, chart, labs and discussed the procedure including the risks, benefits and alternatives for the proposed anesthesia with the patient or authorized representative who has indicated his/her understanding and acceptance.     Dental Advisory Given  Plan Discussed with: CRNA  Anesthesia Plan Comments:         Anesthesia Quick Evaluation

## 2024-01-22 NOTE — Progress Notes (Signed)
 We will proceed with EGD and colonoscopy as scheduled.  I thoroughly discussed with the patient the procedure, including the risks involved. Patient understands what the procedure involves including the benefits and any risks. Patient understands alternatives to the proposed procedure. Risks including (but not limited to) bleeding, tearing of the lining (perforation), rupture of adjacent organs, problems with heart and lung function, infection, and medication reactions. A small percentage of complications may require surgery, hospitalization, repeat endoscopic procedure, and/or transfusion.  Patient understood and agreed.  Katrinka Blazing, MD Gastroenterology and Hepatology [facility] Gastroenterology

## 2024-01-22 NOTE — Op Note (Signed)
 [facility] Patient Name: Amber Stephenson Procedure Date: 01/22/2024 12:54 PM MRN: [id number] Date of Birth: [birth year] Attending MD: Toribio Fortune , , [id number] CSN: [id number] Age: 78 Admit Type: Inpatient Procedure:                Colonoscopy Indications:              Rectal bleeding Providers:                Toribio Fortune, Olam Ada, RN, Jon Loge Referring MD:              Medicines:                Monitored Anesthesia Care Complications:            No immediate complications. Estimated Blood Loss:     Estimated blood loss: none. Procedure:                Pre-Anesthesia Assessment:                           - Prior to the procedure, a History and Physical                            was performed, and patient medications, allergies                            and sensitivities were reviewed. The patient's                            tolerance of previous anesthesia was reviewed.                           - The risks and benefits of the procedure and the                            sedation options and risks were discussed with the                            patient. All questions were answered and informed                            consent was obtained.                           - ASA Grade Assessment: III - A patient with severe                            systemic disease.                           After obtaining informed consent, the colonoscope                            was passed under direct vision. Throughout the                            procedure, the patient's blood pressure, pulse, and  oxygen  saturations were monitored continuously. The                            [id number]-[id number] ([id number]) Peds Colon was introduced                            through the anus and advanced to the the cecum,                            identified by appendiceal orifice and ileocecal                            valve. The colonoscopy was performed  without                            difficulty. The patient tolerated the procedure                            well. The quality of the bowel preparation was good. Scope In: 2:06:46 PM Scope Out: 2:40:13 PM Scope Withdrawal Time: 0 hours 28 minutes 9 seconds  Total Procedure Duration: 0 hours 33 minutes 27 seconds  Findings:      Two sessile polyps were found in the transverse colon. The polyps were 3       to 4 mm in size. These polyps were removed with a cold snare. Resection       and retrieval were complete.      Multiple small localized angiodysplastic lesions without bleeding were       found in the sigmoid colon. Coagulation for bleeding prevention using       argon plasma at 0.3 liters/minute and 20 watts was successful.      A few small-mouthed diverticula were found in the sigmoid colon.      The retroflexed view of the distal rectum and anal verge was normal and       showed no anal or rectal abnormalities. Impression:               - Two 3 to 4 mm polyps in the transverse colon,                            removed with a cold snare. Resected and retrieved.                           - Multiple non-bleeding colonic angiodysplastic                            lesions. Treated with argon plasma coagulation                            (APC).                           - Diverticulosis in the sigmoid colon.                           - The distal rectum and anal verge are normal on  retroflexion view. Moderate Sedation:      Per Anesthesia Care Recommendation:           - Return patient to hospital ward for ongoing care.                           - Resume previous diet.                           - Await pathology results.                           - Repeat colonoscopy for surveillance based on                            pathology results.                           - Restart Eliquis  tonight. Procedure Code(s):        --- Professional ---                            [phone], 59, Colonoscopy, flexible; with control of                            bleeding, any method                           45385, Colonoscopy, flexible; with removal of                            tumor(s), polyp(s), or other lesion(s) by snare                            technique Diagnosis Code(s):        --- Professional ---                           D12.3, Benign neoplasm of transverse colon (hepatic                            flexure or splenic flexure)                           K55.20, Angiodysplasia of colon without hemorrhage                           K62.5, Hemorrhage of anus and rectum CPT copyright 2022 American Medical Association. All rights reserved. The codes documented in this report are preliminary and upon coder review may  be revised to meet current compliance requirements. Toribio Fortune, MD Toribio Fortune,  01/22/2024 2:54:00 PM This report has been signed electronically. Number of Addenda: 0

## 2024-01-22 NOTE — Care Management Important Message (Signed)
 Important Message  Patient Details  Name: Amber Stephenson MRN: [id number] Date of Birth: Oct 10, 1945   Important Message Given:  Yes - Medicare IM     Honest Vanleer L Rachard Isidro 01/22/2024, 11:57 AM

## 2024-01-22 NOTE — Op Note (Addendum)
 [facility] Patient Name: Amber Stephenson Procedure Date: 01/22/2024 12:55 PM MRN: [id number] Date of Birth: 25-Aug-1945 Attending MD: Toribio Fortune , , [id number] CSN: [id number] Age: 78 Admit Type: Inpatient Procedure:                Upper GI endoscopy Indications:              Abdominal pain Providers:                Toribio Fortune, Olam Ada, RN, Jon Loge Referring MD:              Medicines:                Monitored Anesthesia Care Complications:            No immediate complications. Estimated Blood Loss:     Estimated blood loss: none. Procedure:                Pre-Anesthesia Assessment:                           - Prior to the procedure, a History and Physical                            was performed, and patient medications, allergies                            and sensitivities were reviewed. The patient's                            tolerance of previous anesthesia was reviewed.                           - The risks and benefits of the procedure and the                            sedation options and risks were discussed with the                            patient. All questions were answered and informed                            consent was obtained.                           - ASA Grade Assessment: II - A patient with mild                            systemic disease.                           After obtaining informed consent, the endoscope was                            passed under direct vision. Throughout the                            procedure, the patient's blood pressure,  pulse, and                            oxygen  saturations were monitored continuously. The                            [id number] ([id number]) Upper was introduced through                            the mouth, and advanced to the second part of                            duodenum. The upper GI endoscopy was accomplished                            without difficulty. The patient  tolerated the                            procedure well. Scope In: 1:53:29 PM Scope Out: 2:00:00 PM Total Procedure Duration: 0 hours 6 minutes 31 seconds  Findings:      The examined esophagus was normal.      Four non-bleeding linear gastric ulcers with a clean ulcer base (Forrest       Class III) were found in the gastric body. The largest lesion was 10 mm       in largest dimension. All ulcers had scant hemantin. Biopsies from edges       of ulcer were taken with a cold forceps for histology. Biopsies from       body and antrum were taken with a cold forceps for Helicobacter pylori       testing.      A non-bleeding diverticulum was found in the second portion of the       duodenum. Impression:               - Normal esophagus.                           - Non-bleeding gastric ulcers with a clean ulcer                            base (Forrest Class III). Biopsied.                           - Non-bleeding duodenal diverticulum. Moderate Sedation:      Per Anesthesia Care Recommendation:           - Use Protonix  (pantoprazole ) 40 mg PO BID.                           - No ibuprofen , naproxen, or other non-steroidal                            anti-inflammatory drugs.                           - Await pathology results. Procedure Code(s):        --- Professional ---  56760, Esophagogastroduodenoscopy, flexible,                            transoral; with biopsy, single or multiple Diagnosis Code(s):        --- Professional ---                           K25.9, Gastric ulcer, unspecified as acute or                            chronic, without hemorrhage or perforation                           R10.9, Unspecified abdominal pain                           K57.10, Diverticulosis of small intestine without                            perforation or abscess without bleeding CPT copyright 2022 American Medical Association. All rights reserved. The codes documented in  this report are preliminary and upon coder review may  be revised to meet current compliance requirements. Toribio Fortune, MD Toribio Fortune,  01/22/2024 2:04:59 PM This report has been signed electronically. Number of Addenda: 0

## 2024-01-23 ENCOUNTER — Telehealth: Payer: Self-pay | Admitting: Gastroenterology

## 2024-01-23 DIAGNOSIS — D62 Acute posthemorrhagic anemia: Secondary | ICD-10-CM | POA: Diagnosis not present

## 2024-01-23 DIAGNOSIS — K5710 Diverticulosis of small intestine without perforation or abscess without bleeding: Secondary | ICD-10-CM | POA: Diagnosis not present

## 2024-01-23 DIAGNOSIS — K5520 Angiodysplasia of colon without hemorrhage: Secondary | ICD-10-CM | POA: Diagnosis not present

## 2024-01-23 DIAGNOSIS — K259 Gastric ulcer, unspecified as acute or chronic, without hemorrhage or perforation: Secondary | ICD-10-CM | POA: Diagnosis not present

## 2024-01-23 DIAGNOSIS — K253 Acute gastric ulcer without hemorrhage or perforation: Secondary | ICD-10-CM

## 2024-01-23 DIAGNOSIS — D649 Anemia, unspecified: Secondary | ICD-10-CM | POA: Diagnosis not present

## 2024-01-23 LAB — CBC
HCT: 25.9 % — ABNORMAL LOW (ref 36.0–46.0)
Hemoglobin: 8.1 g/dL — ABNORMAL LOW (ref 12.0–15.0)
MCH: 26.6 pg (ref 26.0–34.0)
MCHC: 31.3 g/dL (ref 30.0–36.0)
MCV: 84.9 fL (ref 80.0–100.0)
Platelets: 210 K/uL (ref 150–400)
RBC: 3.05 MIL/uL — ABNORMAL LOW (ref 3.87–5.11)
RDW: 18.9 % — ABNORMAL HIGH (ref 11.5–15.5)
WBC: 6 K/uL (ref 4.0–10.5)
nRBC: 0 % (ref 0.0–0.2)

## 2024-01-23 LAB — BASIC METABOLIC PANEL WITH GFR
Anion gap: 8 (ref 5–15)
BUN: 8 mg/dL (ref 8–23)
CO2: 23 mmol/L (ref 22–32)
Calcium: 7.5 mg/dL — ABNORMAL LOW (ref 8.9–10.3)
Chloride: 103 mmol/L (ref 98–111)
Creatinine, Ser: 0.74 mg/dL (ref 0.44–1.00)
GFR, Estimated: >60 mL/min (ref >60)
Glucose, Bld: 125 mg/dL — ABNORMAL HIGH (ref 70–99)
Potassium: 4 mmol/L (ref 3.5–5.1)
Sodium: 134 mmol/L — ABNORMAL LOW (ref 135–145)

## 2024-01-23 LAB — GLUCOSE, CAPILLARY
Glucose-Capillary: 108 mg/dL — ABNORMAL HIGH (ref 70–99)
Glucose-Capillary: 120 mg/dL — ABNORMAL HIGH (ref 70–99)
Glucose-Capillary: 93 mg/dL (ref 70–99)

## 2024-01-23 MED ORDER — METFORMIN HCL ER 500 MG PO TB24: 1000.0000 mg | ORAL_TABLET | Freq: Every day | ORAL | 3 refills | Status: DC

## 2024-01-23 MED ORDER — PANTOPRAZOLE SODIUM 40 MG PO TBEC: 40.0000 mg | DELAYED_RELEASE_TABLET | Freq: Two times a day (BID) | ORAL | 5 refills | Status: AC

## 2024-01-23 MED ORDER — ACETAMINOPHEN 325 MG PO TABS: 650.0000 mg | ORAL_TABLET | Freq: Four times a day (QID) | ORAL | Status: AC | PRN

## 2024-01-23 MED ORDER — ARIPIPRAZOLE 2 MG PO TABS: 2.0000 mg | ORAL_TABLET | Freq: Every day | ORAL | 5 refills | Status: AC

## 2024-01-23 MED ORDER — INSULIN LISPRO (1 UNIT DIAL) 100 UNIT/ML (KWIKPEN): 0.0000 [IU] | PEN_INJECTOR | Freq: Two times a day (BID) | SUBCUTANEOUS | 1 refills | Status: AC

## 2024-01-23 MED ORDER — METOPROLOL TARTRATE 50 MG PO TABS: 50.0000 mg | ORAL_TABLET | Freq: Two times a day (BID) | ORAL | 3 refills | Status: AC

## 2024-01-23 MED ORDER — GABAPENTIN 100 MG PO CAPS: 100.0000 mg | ORAL_CAPSULE | Freq: Three times a day (TID) | ORAL | 3 refills | Status: AC

## 2024-01-23 MED ORDER — FUROSEMIDE 40 MG PO TABS: 40.0000 mg | ORAL_TABLET | Freq: Every day | ORAL | 3 refills | Status: DC

## 2024-01-23 MED ORDER — ANUCORT-HC 25 MG RE SUPP: 25.0000 mg | Freq: Every day | RECTAL | 0 refills | Status: AC

## 2024-01-23 MED ORDER — FUROSEMIDE 40 MG PO TABS: 40.0000 mg | ORAL_TABLET | Freq: Once | ORAL | Status: DC

## 2024-01-23 MED ORDER — LANTUS SOLOSTAR 100 UNIT/ML ~~LOC~~ SOPN: 10.0000 [IU] | PEN_INJECTOR | Freq: Every day | SUBCUTANEOUS | 0 refills | Status: AC

## 2024-01-23 MED ORDER — CARBIDOPA-LEVODOPA 10-100 MG PO TABS: 1.0000 | ORAL_TABLET | Freq: Four times a day (QID) | ORAL | 5 refills | Status: AC

## 2024-01-23 MED ORDER — HYDROXYZINE PAMOATE 25 MG PO CAPS: 25.0000 mg | ORAL_CAPSULE | Freq: Three times a day (TID) | ORAL | 5 refills | Status: AC | PRN

## 2024-01-23 MED ORDER — ATORVASTATIN CALCIUM 80 MG PO TABS: 80.0000 mg | ORAL_TABLET | Freq: Every day | ORAL | 3 refills | Status: AC

## 2024-01-23 NOTE — Plan of Care (Signed)

## 2024-01-23 NOTE — Progress Notes (Signed)
 Toribio Fortune, M.D. Gastroenterology & Hepatology   Interval History:  Patient reports feeling well and much better compared to prior.  Not having any more diarrhea, no presence of abdominal pain, nausea or vomiting.  Tolerating diet.  No more episodes of melena or hematochezia.  Inpatient Medications:  Current Facility-Administered Medications:    acetaminophen  (TYLENOL ) tablet 650 mg, 650 mg, Oral, Q6H PRN **OR** acetaminophen  (TYLENOL ) suppository 650 mg, 650 mg, Rectal, Q6H PRN, Bryn Bernardino NOVAK, MD   ALPRAZolam  (XANAX ) tablet 1 mg, 1 mg, Oral, QHS PRN, Bryn Bernardino NOVAK, MD, 1 mg at 01/22/24 2246   apixaban  (ELIQUIS ) tablet 5 mg, 5 mg, Oral, BID, Emokpae, Courage, MD, 5 mg at 01/23/24 9197   ARIPiprazole  (ABILIFY ) tablet 2 mg, 2 mg, Oral, QHS, Bryn Bernardino B, MD, 2 mg at 01/22/24 2203   atorvastatin  (LIPITOR) tablet 40 mg, 40 mg, Oral, Daily, Bryn Bernardino B, MD, 40 mg at 01/23/24 9196   carbidopa -levodopa  (SINEMET  IR) 10-100 MG per tablet immediate release 1 tablet, 1 tablet, Oral, QID, Bryn Bernardino NOVAK, MD, 1 tablet at 01/23/24 0808   escitalopram  (LEXAPRO ) tablet 10 mg, 10 mg, Oral, Daily, Bryn Bernardino B, MD, 10 mg at 01/23/24 9196   gabapentin  (NEURONTIN ) capsule 100 mg, 100 mg, Oral, TID, Bryn Bernardino NOVAK, MD, 100 mg at 01/23/24 9197   hydrALAZINE  (APRESOLINE ) injection 10 mg, 10 mg, Intravenous, Q4H PRN, Bryn Bernardino NOVAK, MD   HYDROmorphone  (DILAUDID ) injection 0.5-1 mg, 0.5-1 mg, Intravenous, Q3H PRN, Bryn Bernardino B, MD, 1 mg at 01/22/24 1035   hydrOXYzine  (ATARAX ) tablet 25 mg, 25 mg, Oral, TID, Bryn Bernardino NOVAK, MD, 25 mg at 01/23/24 0802   insulin  aspart (novoLOG ) injection 0-15 Units, 0-15 Units, Subcutaneous, Q4H, Grunz, Ryan B, MD, 2 Units at 01/22/24 2003   lactulose  (CHRONULAC ) 10 GM/15ML solution 30 g, 30 g, Oral, BID, Bryn Bernardino B, MD, 30 g at 01/21/24 1816   [COMPLETED] loperamide  (IMODIUM ) capsule 4 mg, 4 mg, Oral, Once, 4 mg at 01/20/24 1104 **FOLLOWED BY** loperamide  (IMODIUM ) capsule 2  mg, 2 mg, Oral, PRN, Bryn Bernardino NOVAK, MD   magic mouthwash w/lidocaine , 5 mL, Oral, TID PRN, Bryn Bernardino NOVAK, MD, 5 mL at 01/23/24 0803   metoprolol  tartrate (LOPRESSOR ) tablet 50 mg, 50 mg, Oral, BID, Bryn Bernardino B, MD, 50 mg at 01/23/24 9197   OXcarbazepine  (TRILEPTAL ) tablet 150 mg, 150 mg, Oral, BID, Bryn Bernardino B, MD, 150 mg at 01/23/24 9191   oxyCODONE  (OXYCONTIN ) 12 hr tablet 20 mg, 20 mg, Oral, Q12H, Bryn Bernardino B, MD, 20 mg at 01/23/24 0803   pantoprazole  (PROTONIX ) EC tablet 40 mg, 40 mg, Oral, BID, Castaneda Mayorga, Meleny Tregoning, MD, 40 mg at 01/23/24 0802   rOPINIRole  (REQUIP ) tablet 1 mg, 1 mg, Oral, QHS, Bryn Bernardino NOVAK, MD, 1 mg at 01/22/24 2203  Facility-Administered Medications Ordered in Other Encounters:    bupivacaine -epinephrine  (MARCAINE  W/ EPI) 0.5% -1:200000 injection, , , Anesthesia Intra-op, Tilford Franky BIRCH, MD, 12 mL at 01/21/19 0935   I/O    Intake/Output Summary (Last 24 hours) at 01/23/2024 0951 Last data filed at 01/22/2024 1730 Gross per 24 hour  Intake 740 ml  Output --  Net 740 ml     Physical Exam: Temp:  [98.1 F (36.7 C)-99.6 F (37.6 C)] 99.6 F (37.6 C) (08/23 0300) Pulse Rate:  [64-101] 96 (08/23 0300) Resp:  [16-20] 18 (08/23 0300) BP: (84-127)/(51-75) 123/68 (08/23 0300) SpO2:  [94 %-100 %] 96 % (08/23 0300)  Temp (24hrs), Avg:98.6  F (37 C), Min:98.1 F (36.7 C), Max:99.6 F (37.6 C)  GENERAL: The patient is AO x3, in no acute distress. HEENT: Has a ecchymoses in her left periorbital area.  EOMI are intact. Mouth is well hydrated and without lesions. NECK: Supple. No masses LUNGS: Clear to auscultation. No presence of rhonchi/wheezing/rales. Adequate chest expansion HEART: RRR, normal s1 and s2. ABDOMEN: Soft, nontender, no guarding, no peritoneal signs, and nondistended. BS +. No masses. EXTREMITIES: Without any cyanosis, clubbing, rash, lesions or edema. NEUROLOGIC: AOx3, no focal motor deficit. SKIN: no jaundice, no rashes  Laboratory  Data: CBC:     Component Value Date/Time   WBC 6.0 01/23/2024 0845   RBC 3.05 (L) 01/23/2024 0845   HGB 8.1 (L) 01/23/2024 0845   HGB 7.9 (L) 01/15/2024 1445   HCT 25.9 (L) 01/23/2024 0845   HCT 26.0 (L) 01/15/2024 1445   PLT 210 01/23/2024 0845   PLT 176 01/15/2024 1445   MCV 84.9 01/23/2024 0845   MCV 87 01/15/2024 1445   MCH 26.6 01/23/2024 0845   MCHC 31.3 01/23/2024 0845   RDW 18.9 (H) 01/23/2024 0845   RDW 16.9 (H) 01/15/2024 1445   LYMPHSABS 0.5 (L) 01/21/2024 0613   LYMPHSABS 0.5 (L) 01/15/2024 1445   MONOABS 0.8 01/21/2024 0613   EOSABS 0.1 01/21/2024 0613   EOSABS 0.2 01/15/2024 1445   BASOSABS 0.0 01/21/2024 0613   BASOSABS 0.0 01/15/2024 1445   COAG:  Lab Results  Component Value Date   INR 1.8 (H) 01/20/2024   INR 1.3 (H) 10/07/2023   INR 1.4 (H) 10/07/2023    BMP:     Latest Ref Rng & Units 01/23/2024    8:45 AM 01/22/2024    5:04 AM 01/21/2024    4:59 PM  BMP  Glucose 70 - 99 mg/dL 874  899  891   BUN 8 - 23 mg/dL 8  8  9    Creatinine 0.44 - 1.00 mg/dL 9.25  9.33  9.26   Sodium 135 - 145 mmol/L 134  130  133   Potassium 3.5 - 5.1 mmol/L 4.0  4.0  4.7   Chloride 98 - 111 mmol/L 103  99  103   CO2 22 - 32 mmol/L 23  22  22    Calcium  8.9 - 10.3 mg/dL 7.5  7.3  7.2     HEPATIC:     Latest Ref Rng & Units 01/20/2024    4:09 AM 01/19/2024    6:07 AM 01/15/2024    2:45 PM  Hepatic Function  Total Protein 6.5 - 8.1 g/dL 6.1  6.7  6.2   Albumin  3.5 - 5.0 g/dL 3.1  3.3  3.8   AST 15 - 41 U/L 26  30  24    ALT 0 - 44 U/L 5  11  7    Alk Phosphatase 38 - 126 U/L 138  136  136   Total Bilirubin 0.0 - 1.2 mg/dL 1.1  0.8  0.4     CARDIAC:  Lab Results  Component Value Date   CKTOTAL 387 (H) 09/01/2022   TROPONINI <0.03 10/16/2017      Imaging: I personally reviewed and interpreted the available labs, imaging and endoscopic files.   Assessment/Plan: 78 y.o. year old female with endometrial cancer medically inoperable, s/p IMRT, Afib on Eliquis ,  bipolar 1 disorder, CAD, CHF, chronic pain, DM, HTN, Herpes, NAFLD, OSA, osteopenia, RLS, pulmonary hypertension and previous history of microscopic colitis (diagnosed at outside facility, not on treatment), who came  to the hospital after presenting diarrhea, abdominal pain and rectal bleeding.   Patient initially presented significant abdominal pain which was highly concerning, for which she underwent CT of the abdomen without contrast and MRI of the abdomen and pelvis with IV contrast, which were negative for any acute abnormalities explaining her abdominal pain.  She subsequently presented with rectal bleeding and abdominal pain, for which she underwent bidirectional endoscopy during the current hospitalization yesterday.  EGD showed presence of linear ulcers with hematin covering the ulcerations in the stomach, which were biopsied.  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  Colonoscopy showed presence of significant radiation sigmoiditis with AVMs, for which she had APC ablation of these lesions, as well as removal of 2 polyps in the transverse colon.  After undergoing endoscopic evaluation and management, the patient reports significant improvement of her abdominal pain and no more episode of diarrhea or rectal bleeding.  Hemoglobin has actually improved up to 8.1.  It is very likely her acute presentation was a combination of radiation sigmoiditis and peptic ulcer disease.  I encouraged the patient to avoid using any NSAIDs and she will need to continue with PPI twice daily.  Will need to follow pathology results and consider repeat EGD in follow-up.  - Pantoprazole   40 mg q12h BID - Avoid NSAIDs - Follow up pathology results - May consider repeat EGD in 3 months to assess ulcer healing - Patient will follow up in GI clinic  in 3-4 weeks - GI service will sign-off, please call us  back if you have any more questions.  Toribio Fortune, MD Gastroenterology and Hepatology [facility]  Gastroenterology

## 2024-01-23 NOTE — Discharge Summary (Signed)
 Amber Stephenson, is a 78 y.o. female  DOB 28-Dec-1945  MRN [id number].  Admission date:  01/19/2024  Admitting Physician  [name], MD  Discharge Date:  01/23/2024   Primary MD  [name], MD  Recommendations for primary care physician for things to follow:  1)Sliding scale Short acting Humalog  insulin  per sliding scale 0-10 ---:-- Insulin  injection 0-10 Units 0-10 Units Subcutaneous, 3 times daily with meals CBG 70 - 120: 0 unit CBG 121 - 150: 0 unit  CBG 151 - 200: 1 unit CBG 201 - 250: 2 units CBG 251 - 300: 4 units CBG 301 - 350: 6 units  CBG 351 - 400: 8 units  CBG > 400: 10 units  2)Avoid ibuprofen /Advil /Aleve/Motrin Josefine Powders/Naproxen/BC powders/Meloxicam/Diclofenac /Indomethacin and other Nonsteroidal anti-inflammatory medications as these will make you more likely to bleed and can cause stomach ulcers, can also cause Kidney problems.   3)Please do NOT restart diclofenac  (Voltaren ) due to increased risk of bleeding  4)Watch for bleeding while on Blood Thinners--watch for blood in your stool which can make your stool black, maroon, mahogany or red---, blood in your urine which can make your urine pink or red, nosebleeds , also watch for possible bruising -You are taking Apixaban /Eliquis --- which is a blood thinner--- be careful to avoid injury or falls  5) repeat CBC blood test every Thursday s x 2 consecutive weeks tarting 01/28/2024 advised  6) please follow-up to primary care physician within a week for further adjustment of your insulin  regimen--please keep a diary of your glucose checks/fingersticks and take this with you to your PCP within a week  Admission Diagnosis  Hyponatremia [E87.1] Diarrhea, unspecified type [R19.7] Acute anemia [D64.9] ABLA (acute blood loss anemia) [D62]   Discharge Diagnosis  Hyponatremia [E87.1] Diarrhea, unspecified type [R19.7] Acute anemia [D64.9] ABLA  (acute blood loss anemia) [D62]    Principal Problem:   ABLA (acute blood loss anemia) Active Problems:   Chronic heart failure with preserved ejection fraction (HFpEF) (HCC)   Frequent falls   Insulin  dependent type 2 diabetes mellitus (HCC)   Long term current use of anticoagulant   RLS (restless legs syndrome)   Chronic pain syndrome   CAD (coronary artery disease)   Chronic respiratory failure with hypoxia (HCC)   Opioid dependence   Generalized weakness   Endometrial cancer (HCC)   Primary hypertension   Type 2 diabetes mellitus with hyperglycemia, with long-term current use of insulin  (HCC)   Hyponatremia   Diarrhea   Hematochezia   Acute anemia      Past Medical History:  Diagnosis Date   Allergic rhinitis 02/24/2019   Ambulatory dysfunction 04/23/2020   Atrial fibrillation with RVR (HCC) 05/19/2017   Back pain/sacroiliitis--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   Benign paroxysmal positional vertigo due to bilateral vestibular disorder 05/20/2019   Bipolar 1 disorder (HCC) 01/23/2015   with GAD, benzo dependence   CAD (coronary artery disease)  Nonobstructive; Managed by Dr. Charls   Cardiomegaly 01/12/2018   CHF (congestive heart failure) 06/08/2022   Chronic back pain 01/04/2015   Chronic constipation 04/25/2020   Chronic diastolic heart failure (HCC) 05/30/2015   Chronic pain syndrome 08/22/2019   back pain, sacroiliitis   Chronic post-traumatic stress disorder (PTSD) 12/06/2020   Diabetic neuropathy (HCC) 02/06/2016   Dyslipidemia 09/24/2020   Essential hypertension    Functional diarrhea 10/26/2020   Herpes genitalis in women 07/16/2015   History of adenomatous polyp of colon 05/21/2019   Overview:   03/31/17: Colonoscopy: nonadvanced adenoma, microscopic colitis, f/u 5 yrs, Murphy/GAP   History of radiation therapy    Endometrial-  02/18/23-03/31/23-Dr. Lynwood Kinard   Hypotension 09/01/2022   Insomnia 01/23/2015   Metabolic acidosis 01/12/2023   Migraine headache with aura 02/12/2016   Myofascial pain dysfunction syndrome 08/22/2019   Non-alcoholic fatty liver disease 01/12/2018   OSA (obstructive sleep apnea) 02/24/2019   10/09/2018 - HST  - AHI 40.6    Osteopenia 12/06/2020   Rx alendronate  35 mg.   Pinguecula 12/06/2020   Presbyopia 12/06/2020   Pulmonary hypertension    RLS (restless legs syndrome) 04/27/2015   RSV (respiratory syncytial virus infection) 06/10/2023   Tremor, essential 12/11/2021   Type II diabetes mellitus (HCC) 09/24/2020    Past Surgical History:  Procedure Laterality Date   BREAST REDUCTION SURGERY     COLONOSCOPY  2018   6 mm cecal tubular adenoma, sigmoid diverticulosis, random colon biopsies consistent with microscopic colitis   COLONOSCOPY N/A 01/22/2024   Procedure: COLONOSCOPY;  Surgeon: Eartha Angelia Sieving, MD;  Location: [facility];  Service: Gastroenterology;  Laterality: N/A;   ESOPHAGOGASTRODUODENOSCOPY N/A 01/22/2024   Procedure: EGD (ESOPHAGOGASTRODUODENOSCOPY);  Surgeon: Eartha Angelia, Sieving, MD;  Location: [facility];  Service: Gastroenterology;  Laterality: N/A;   EYE SURGERY Right    cateracts   HAMMER TOE SURGERY     LEFT HEART CATH AND CORONARY ANGIOGRAPHY N/A 02/03/2018   Procedure: LEFT HEART CATH AND CORONARY ANGIOGRAPHY;  Surgeon: [name], Peter M, MD;  Location: [facility] INVASIVE CV LAB;  Service: Cardiovascular;  Laterality: N/A;   REVERSE SHOULDER ARTHROPLASTY Right 08/19/2019   Procedure: REVERSE SHOULDER ARTHROPLASTY;  Surgeon: Kay Kemps, MD;  Location: [facility];  Service: Orthopedics;  Laterality: Right;  interscalene block   SHOULDER SURGERY Right    I BROKE MY SHOUDLER   THIGH SURGERY     TO REMOVE A TUMOR      HPI  from the history and physical done on the day of admission:   HPI: Amber Stephenson is a 78 y.o. female with medical  history significant of endometrial CA, AFib, CAD, chronic pain, T2DM who presented to the ED early this morning with persistent severe diarrhea and abdominal pain. She and her husband report many episodes of loose stools despite taking imodium  and lomotil  at home the past several days.  She's had trouble taking any food and has been pushing fluids at home, feeling more weak than usual and has had several falls since Sunday. She was recently treated with abx for UTI.    In the ED she was noted to be acutely anemic with +blood clots in stool. Admission requested, GI consulted.    Review of Systems: As mentioned in the history of present illness. All other systems reviewed and are negative.   Hospital Course:   1) acute on chronic anemia due to acute blood loss in the setting of GI bleed -Hgb was 11.7  on 10/09/2023 and 11.2 on 10/10/2023 -Hgb on admission on 01/19/2024 was 7.6 -Hgb currently above 8 and stable - Patient underwent EGD and colonoscopy on 01/22/2024 with findings as noted below:- EGD FINDINGS: - Normal esophagus.  - Non-bleeding gastric ulcers with a clean ulcer base (Forrest Class III).  Non-bleeding duodenal diverticulum.  COLONOSCOPY FINDINGS: - Two 3 to 4 mm polyps in the transverse colon, removed with a cold snare. Resected and retrieved.  - Multiple non-bleeding colonic angiodysplastic lesions. Treated with argon plasma coagulation (APC).  - Per GI team continue to avoid NSAIDs - Change Protonix  to 40 twice daily and Carafate - GI team will contact patient with pathology results restarted Eliquis  p.m. of 01/22/2024  .  2)Diarrhea: Acutely worsened more chronic problem. Lactic acid level normal, MRI abd/pelvis consistent with diarrheal illness, few diverticula without inflammation.  -- GI pathogen panel and C. diff negative >> May use Imodium  as needed - Colonoscopy as noted above -Diarrhea has improved significantly at this time   3)AKI----acute kidney injury partly due to #2  above  creatinine on admission= 1.22  ,  -baseline creatinine = 0.76    , - creatinine is now= 0.74  ,  --renally adjust medications, avoid nephrotoxic agents / dehydration  / hypotension    4) acute on chronic hyponatremia---sodium is currently  134 -Overall much improved -- Restart diet - Avoid excessive free water  -Stop HCTZ,  -okay to continue Lasix  with potassium supplementation     5)Chronic AFib:  -Restarted Eliquis  p.m. of 01/22/2024 per GI team - Continue metoprolol  for rate control   6)Endometrial CA: s/p IMRT, boost Tx:  - Follow up with Dr. Shannon   7)Chronic HFpEF, HTN:  - Echo from 01/20/2023 with EF of 50 to 55%, - Stable, okay to restart Lasix    Chronic pain:  - Continue oxycontin  20mg  BID Discontinue diclofenac , avoid NSAIDs - Continue gabapentin     Falls, weakness:  - PT/OT in addition to management as outlined above. She remains quite deconditioned, may require SNF level rehabilitation.  -Patient and husband declined SNF placement and requested home health physical therapy with [facility]  CAD:  - No aspirin  due to need for Eliquis  for A-fib, chest pain-free, not on -Continue metoprolol  and atorvastatin     IDT2DM: HbA1c 6.7%.  -Resume metformin , mounjaro  (BMI 32).  -Follow-up with PCP for further adjustment of insulin  regimen   Anxiety:  - Continue abilify , escitalopram , oxcarbazepine , xanax    RLS:  - Continue requip  - Continue sinemet    Chronic hypoxic respiratory failure:  - Continue O2 prn -- O2 sats on room air currently 95 to 96%  Discharge Condition: Stable,  Follow UP   Follow-up Information     [facility] Follow up.   Why: PT will call to schedule your next home visit.        [name], MD. Schedule an appointment as soon as possible for a visit in 4 day(s).   Specialty: Family Medicine Why: Repeat CBC blood test every Thursday for 2 consecutive weeks starting 01/28/2024 Contact information: [address] [place] [address] [phone]                 Consults obtained -GI  Diet and Activity recommendation:  As advised  Discharge Instructions    Discharge Instructions     Call MD for:  difficulty breathing, headache or visual disturbances   Complete by: As directed    Call MD for:  persistant dizziness or light-headedness  Complete by: As directed    Call MD for:  persistant nausea and vomiting   Complete by: As directed    Call MD for:  temperature >100.4   Complete by: As directed    Diet - low sodium heart healthy   Complete by: As directed    Diet Carb Modified   Complete by: As directed    Discharge instructions   Complete by: As directed    1)Sliding scale Short acting Humalog  insulin  per sliding scale 0-10 ---:-- Insulin  injection 0-10 Units 0-10 Units Subcutaneous, 3 times daily with meals CBG 70 - 120: 0 unit CBG 121 - 150: 0 unit  CBG 151 - 200: 1 unit CBG 201 - 250: 2 units CBG 251 - 300: 4 units CBG 301 - 350: 6 units  CBG 351 - 400: 8 units  CBG > 400: 10 units  2)Avoid ibuprofen /Advil /Aleve/Motrin Josefine Powders/Naproxen/BC powders/Meloxicam/Diclofenac /Indomethacin and other Nonsteroidal anti-inflammatory medications as these will make you more likely to bleed and can cause stomach ulcers, can also cause Kidney problems.   3)Please do NOT restart diclofenac  (Voltaren ) due to increased risk of bleeding  4)Watch for bleeding while on Blood Thinners--watch for blood in your stool which can make your stool black, maroon, mahogany or red---, blood in your urine which can make your urine pink or red, nosebleeds , also watch for possible bruising -You are taking Apixaban /Eliquis --- which is a blood thinner--- be careful to avoid injury or falls  5) repeat CBC blood test every Thursday s x 2 consecutive weeks tarting 01/28/2024 advised  6) please follow-up to primary care physician within a week for further adjustment of your insulin  regimen--please keep a diary of  your glucose checks/fingersticks and take this with you to your PCP within a week   Increase activity slowly   Complete by: As directed          Discharge Medications     Allergies as of 01/23/2024       Reactions   Iodinated Contrast Media Anaphylaxis, Swelling, Other (See Comments)   Throat closes, swelling of throat and tongue   Iodine Anaphylaxis   Latex Other (See Comments)   Latex tape pulls skin with it   Tape Other (See Comments)   Pulls off the skin, if latex   Tizanidine  Other (See Comments)   Weakness, goofy, bad dreams        Medication List     STOP taking these medications    diclofenac  75 MG EC tablet Commonly known as: VOLTAREN    hydrochlorothiazide  12.5 MG capsule Commonly known as: MICROZIDE    meclizine  25 MG tablet Commonly known as: ANTIVERT    omeprazole  40 MG capsule Commonly known as: PRILOSEC       TAKE these medications    acetaminophen  325 MG tablet Commonly known as: TYLENOL  Take 2 tablets (650 mg total) by mouth every 6 (six) hours as needed for mild pain (pain score 1-3) or fever (or Fever >/= 101).   ALPRAZolam  1 MG tablet Commonly known as: XANAX  Take 1 mg by mouth at bedtime as needed for anxiety or sleep.   Anucort-HC  25 MG suppository Generic drug: hydrocortisone  Place 1 suppository (25 mg total) rectally daily for 12 days.   ARIPiprazole  2 MG tablet Commonly known as: ABILIFY  Take 1 tablet (2 mg total) by mouth at bedtime.   atorvastatin  80 MG tablet Commonly known as: LIPITOR Take 1 tablet (80 mg total) by mouth daily.   carbidopa -levodopa  10-100 MG tablet Commonly known as:  Sinemet  Take 1 tablet by mouth 4 (four) times daily. For restless leg syndrome   colchicine  0.6 MG tablet Take 1 tablet (0.6 mg total) by mouth 2 (two) times daily. For mouth sores   cyanocobalamin  1000 MCG tablet Commonly known as: VITAMIN B12 Take 1 tablet (1,000 mcg total) by mouth daily.   Dexcom G7 Sensor Misc CHANGE SENSOR  EVERY 10 DAYS   diclofenac  Sodium 1 % Gel Commonly known as: VOLTAREN  Apply 2 g topically 3 (three) times daily as needed.   diphenoxylate -atropine  2.5-0.025 MG tablet Commonly known as: LOMOTIL  TAKE 2 TABLETS BY MOUTH FOUR TIMES DAILY AS NEEDED FOR DIARRHEA OR LOOSE STOOLS   Eliquis  5 MG Tabs tablet Generic drug: apixaban  TAKE 1 TABLET TWICE A DAY   escitalopram  10 MG tablet Commonly known as: LEXAPRO  Take 10 mg by mouth daily.   furosemide  40 MG tablet Commonly known as: LASIX  Take 1 tablet (40 mg total) by mouth daily.   gabapentin  100 MG capsule Commonly known as: NEURONTIN  Take 1 capsule (100 mg total) by mouth 3 (three) times daily.   hydrOXYzine  25 MG capsule Commonly known as: VISTARIL  Take 1 capsule (25 mg total) by mouth every 8 (eight) hours as needed for anxiety. What changed:  when to take this reasons to take this   insulin  lispro 100 UNIT/ML KwikPen Commonly known as: HumaLOG  KwikPen Inject 0-10 Units into the skin 2 (two) times daily before a meal. Sliding scale Short acting Humalog  insulin  per sliding scale 0-10 ---:-- Insulin  injection 0-10 Units 0-10 Units Subcutaneous, 3 times daily with meals CBG 70 - 120: 0 unit CBG 121 - 150: 0 unit  CBG 151 - 200: 1 unit CBG 201 - 250: 2 units CBG 251 - 300: 4 units CBG 301 - 350: 6 units  CBG 351 - 400: 8 units  CBG > 400: 10 units What changed:  how much to take how to take this when to take this additional instructions   Insulin  Pen Needle 29G X Misc 1 Device by Does not apply route daily in the afternoon.   Lantus  SoloStar 100 UNIT/ML Solostar Pen Generic drug: insulin  glargine Inject 10 Units into the skin at bedtime. What changed:  how much to take when to take this   losartan  25 MG tablet Commonly known as: COZAAR  Take 25 mg by mouth daily.   MAGNESIUM  PO Take 1 tablet by mouth daily.   metFORMIN  500 MG 24 hr tablet Commonly known as: GLUCOPHAGE -XR Take 2 tablets (1,000 mg total) by  mouth daily with breakfast.   Methocarbamol  1000 MG Tabs Take 1,000 mg by mouth every 6 (six) hours as needed.   metoprolol  tartrate 50 MG tablet Commonly known as: LOPRESSOR  Take 1 tablet (50 mg total) by mouth 2 (two) times daily.   ondansetron  4 MG tablet Commonly known as: Zofran  Take 1 tablet (4 mg total) by mouth every 8 (eight) hours as needed for nausea or vomiting. For cancer related nausea   OXcarbazepine  150 MG tablet Commonly known as: TRILEPTAL  Take 1 tablet (150 mg total) by mouth 2 (two) times daily.   oxyCODONE  20 mg 12 hr tablet Commonly known as: OxyCONTIN  Take 1 tablet (20 mg total) by mouth every 12 (twelve) hours. G89.3- chronic pain- fill 28 days after 8/25- Rx What changed: Another medication with the same name was removed. Continue taking this medication, and follow the directions you see here.   pantoprazole  40 MG tablet Commonly known as: PROTONIX  Take 1 tablet (  40 mg total) by mouth 2 (two) times daily.   potassium chloride  10 MEQ tablet Commonly known as: KLOR-CON  Take 1 tablet (10 mEq total) by mouth daily.   Prolia  60 MG/ML Sosy injection Generic drug: denosumab  Inject 60 mg into the skin every 6 (six) months.   rOPINIRole  1 MG tablet Commonly known as: REQUIP  Take 1 mg by mouth at bedtime.   tirzepatide  12.5 MG/0.5ML Pen Commonly known as: MOUNJARO  Inject 12.5 mg into the skin once a week.   Vitamin D  (Cholecalciferol ) 25 MCG (1000 UT) Tabs Take 1,000 Units by mouth in the morning.        Major procedures and Radiology Reports - PLEASE review detailed and final reports for all details, in brief -   CT HEAD WO CONTRAST ( ) Result Date: 01/21/2024 CLINICAL DATA:  Provided history: Mental status change, unknown cause. EXAM: CT HEAD WITHOUT CONTRAST TECHNIQUE: Contiguous axial images were obtained from the base of the skull through the vertex without intravenous contrast. RADIATION DOSE REDUCTION: This exam was performed according to  the departmental dose-optimization program which includes automated exposure control, adjustment of the mA and/or kV according to patient size and/or use of iterative reconstruction technique. COMPARISON:  Head CT 01/12/2024. FINDINGS: Brain: Generalized cerebral atrophy. Patchy and ill-defined hypoattenuation within the cerebral white matter, nonspecific but compatible with mild-to-moderate chronic small vessel ischemic disease. There is no acute intracranial hemorrhage. No demarcated cortical infarct. No extra-axial fluid collection. No evidence of an intracranial mass. No midline shift. Vascular: No hyperdense vessel.  Atherosclerotic calcifications. Skull: No calvarial fracture or aggressive osseous lesion. Sinuses/Orbits: No mass or acute finding within the imaged orbits. No significant paranasal sinus disease at the imaged levels. IMPRESSION: 1. No evidence of an acute intracranial abnormality. 2. Parenchymal atrophy and chronic small vessel ischemic disease. Electronically Signed   By: Rockey Childs D.O.   On: 01/21/2024 19:21   DG CHEST PORT 1 VIEW Result Date: 01/21/2024 CLINICAL DATA:  [id number] Acute respiratory failure with hypoxia (HCC) [id number] EXAM: PORTABLE CHEST - 1 VIEW COMPARISON:  December 30, 2023 FINDINGS: Lower lung volumes with bilateral perihilar interstitial opacities. Elevation of the right hemidiaphragm. No pneumothorax or pleural effusion. Moderate cardiomegaly. Aortic atherosclerosis. No acute fracture or destructive lesions. Multilevel thoracic osteophytosis. Right shoulder arthroplasty. IMPRESSION: Moderate cardiomegaly with findings suggestive of interstitial edema. Electronically Signed   By: Rogelia Myers M.D.   On: 01/21/2024 16:46   MR ABDOMEN W WO CONTRAST Result Date: 01/19/2024 CLINICAL DATA:  Acute onset watery diarrhea EXAM: MRI ABDOMEN WITHOUT AND WITH CONTRAST TECHNIQUE: Multiplanar multisequence MR imaging of the abdomen was performed both before and after the  administration of intravenous contrast. CONTRAST:  8mL GADAVIST  GADOBUTROL  1 MMOL/ML IV SOLN COMPARISON:  CT abdomen pelvis January 19, 2024, same day MR pelvis. FINDINGS: Lower chest: Cardiomegaly. No pericardial no pleural fluid. Limited evaluation of the lungs on MRI. Hepatobiliary: Subcentimeter left hepatic lobe cyst . No significant hepatic steatosis. Non cirrhotic morphology. No hepatic biliary dilatation. Gallbladder is slightly distended with questionable sludge/tiny stones. Normal wall thickness. CBD is dilated up to 8 mm proximally and tapers to 4 mm at the pancreatic head. Pancreas: Atrophic changes of the pancreas. No significant pancreatic ductal dilatation or abnormal enhancement. Spleen:  Within normal limits. Adrenals/Urinary Tract: Punctate 4 mm left adrenal postcontrast filling defect may represent a tiny nodule likely benign. Right kidney interpolar cortical T2 hyperintense cyst which does not require imaging follow-up. No hydronephrosis. Stomach/Bowel: Gaseous distension of the transverse and ascending  colon with air-fluid level indicating diarrhea. No bowel obstruction, abnormal enhancement or bowel mass lesion. A few colonic diverticula in the descending colon without definite evidence of diverticulitis. Stomach appears unremarkable. Normal appendix. Vascular/Lymphatic: No suspicious lymphadenopathy. Vascular structures are unremarkable. Musculoskeletal: Scoliosis.  Degenerative changes of the spine. IMPRESSION: Gas and fluid containing distended colon in keeping with history of diarrhea. Few colonic diverticula. No bowel mass , obstruction or wall thickening . Left hepatic lobe cyst. Possible gallbladder sludge. Cardiomegaly. Query punctate left adrenal nodule, too small to characterize. Electronically Signed   By: Megan  Zare M.D.   On: 01/19/2024 20:34   MR PELVIS W WO CONTRAST Result Date: 01/19/2024 CLINICAL DATA:  Severe diarrhea EXAM: MRI PELVIS WITHOUT AND WITH CONTRAST TECHNIQUE:  Multiplanar multisequence MR imaging of the pelvis was performed both before and after administration of intravenous contrast. CONTRAST:  8mL GADAVIST  GADOBUTROL  1 MMOL/ML IV SOLN COMPARISON:  Same day CT abdomen pelvis January 19, 2024. FINDINGS: Urinary Tract:  Normal bladder. Bowel: Gas and fluid containing distended transverse colon consistent with known diarrhea. No suspicious bowel lesion within the field of few. Appendix is normal. Vascular/Lymphatic: No suspicious lymphadenopathy. Vascular structures are unremarkable. No aneurysm. Reproductive: Postmenopausal uterus measures 7.8 x 3.1 x 3.8 cm. Normal myometrium. Endometrial thickness measures 1.9 mm. Right ovary is 1.9 x 1 cm. Left ovary measures 1.7 x 1 cm. Other: Bilateral fat containing inguinal hernias right greater than left . Please refer to same day MRI abdomen for further details of the visceral structures of the abdomen. Musculoskeletal: Subcutaneous T2 hyperintensity along the left anterior abdominal wall correlating to site of fat stranding on prior CT and suggestive of inflammatory changes. No significant fluid collection. Degenerative changes of the spine. IMPRESSION: No suspicious bowel lesion or obstruction. Dilated fluid gas and fluid containing transverse colon in keeping with known diarrhea state. Bilateral fat containing inguinal hernias, right greater than left. Mild inflammatory changes in subcutaneous left ventral abdominal wall. Electronically Signed   By: Megan  Zare M.D.   On: 01/19/2024 20:12   CT ABDOMEN PELVIS WO CONTRAST Result Date: 01/19/2024 CLINICAL DATA:  Abdominal pain, acute, nonlocalized EXAM: CT ABDOMEN AND PELVIS WITHOUT CONTRAST TECHNIQUE: Multidetector CT imaging of the abdomen and pelvis was performed following the standard protocol without IV contrast. RADIATION DOSE REDUCTION: This exam was performed according to the departmental dose-optimization program which includes automated exposure control, adjustment of  the mA and/or kV according to patient size and/or use of iterative reconstruction technique. COMPARISON:  CT chest, abdomen, and pelvis dated 01/12/2024. FINDINGS: Lower chest: Chronic bibasilar subpleural reticulation and ground-glass changes. Similar patchy ground-glass nodular densities, most pronounced in the visualized right upper lobe, concerning for multifocal infection, better evaluated on the recent CT chest, abdomen common pelvis dated 01/12/2024. Cardiomegaly. Dense mitral calcification. Hepatobiliary: No suspicious focal hepatic lesion identified within the limits of an unenhanced exam. Gallbladder is unremarkable. No biliary dilatation. Pancreas: Unremarkable Spleen: Unremarkable Adrenals/Urinary Tract: Stable 1 cm left adrenal nodule, essentially unchanged dating back to 12/26/2019, most compatible with a benign etiology and favored to represent an adenoma. The right adrenal gland is unremarkable. No urolithiasis or hydronephrosis. Bladder is unremarkable. Stomach/Bowel: Stomach is within normal limits. Appendix appears normal. No obstruction or focal inflammatory changes. Air-fluid levels within the colon, as can be seen in the setting of diarrheal illness. Vascular/Lymphatic: Aortic atherosclerosis. No enlarged abdominal or pelvic lymph nodes. Reproductive: Uterus and bilateral adnexa are unremarkable. Other: No abdominopelvic ascites. No intraperitoneal free air. Fat containing right  inguinal hernia. Similar infiltration of the subcutaneous soft tissues of the ventral abdominal pannus. No loculated fluid collection. Musculoskeletal: No acute osseous abnormality. No suspicious osseous lesion. Mild chronic T9, T11, and L2 compression deformities. Degenerative disc changes of the thoracolumbar spine. IMPRESSION: 1. Air-fluid levels within the colon, as can be seen in the setting of diarrheal illness. 2. Similar infiltration of the subcutaneous soft tissues of the ventral abdominal pannus. No loculated  fluid collection. Cellulitis not excluded. 3. Chronic interstitial changes of the visualized lung bases with similar patchy ground-glass nodular densities, concerning for multifocal infection and better evaluated on the recent CT chest, abdomen and pelvis dated 01/12/2024. Aortic Atherosclerosis (ICD10-I70.0). Electronically Signed   By: Harrietta Sherry M.D.   On: 01/19/2024 12:55   CT CHEST ABDOMEN PELVIS WO CONTRAST Result Date: 01/12/2024 CLINICAL DATA:  Fall 1 week ago EXAM: CT CHEST, ABDOMEN AND PELVIS WITHOUT CONTRAST TECHNIQUE: Multidetector CT imaging of the chest, abdomen and pelvis was performed following the standard protocol without IV contrast. RADIATION DOSE REDUCTION: This exam was performed according to the departmental dose-optimization program which includes automated exposure control, adjustment of the mA and/or kV according to patient size and/or use of iterative reconstruction technique. COMPARISON:  CT 12/27/2023, 12/18/2023, 10/23/2023, 01/13/2023 FINDINGS: CT CHEST FINDINGS Cardiovascular: Limited evaluation without intravenous contrast. Mild aortic atherosclerosis. No aneurysm. Dense mitral calcification. Cardiomegaly. No pericardial effusion Mediastinum/Nodes: Patent trachea. No thyroid  mass. Mild mediastinal lymph nodes. Prevascular node measures 13 mm. Right paratracheal nodes measure up to 10 mm. Esophagus within normal limits. Lungs/Pleura: Underlying mild chronic subpleural reticulation and subtle ground-glass disease, consistent with chronic change. More acute multifocal heterogeneous ground-glass densities throughout the bilateral lungs, suspicious for superimposed pneumonia. Scattered small pulmonary nodules measuring up to 5 mm, stable, no specific imaging follow-up is recommended. Musculoskeletal: Sternum appears intact. Right shoulder replacement. No acute rib fracture. See separately dictated spine CT CT ABDOMEN PELVIS FINDINGS Hepatobiliary: No focal liver abnormality is  seen. No gallstones, gallbladder wall thickening, or biliary dilatation. Pancreas: Unremarkable. No pancreatic ductal dilatation or surrounding inflammatory changes. Spleen: Normal in size without focal abnormality. Adrenals/Urinary Tract: Adrenal glands are stable in appearance with 1 cm left adrenal nodule, likely an adenoma, no specific imaging follow-up is recommended. Kidneys show no hydronephrosis. The bladder is unremarkable Stomach/Bowel: The stomach is nonenlarged. There is no dilated small bowel. Negative appendix. Large stool burden. Vascular/Lymphatic: Aortic atherosclerosis. No enlarged abdominal or pelvic lymph nodes. Reproductive: Uterus and bilateral adnexa are unremarkable. Other: Negative for pelvic effusion or free air. Infiltration of the subcutaneous soft tissues of the abdominal pannus. Fat containing right groin hernia. Musculoskeletal: See separately dictated spine CT. No evidence for a pelvic fracture. IMPRESSION: 1. No CT evidence for acute traumatic intrathoracic, intra-abdominal, or intrapelvic abnormality allowing for absence of intravenous contrast. 2. Underlying chronic lung disease with superimposed multifocal heterogeneous ground-glass densities throughout the bilateral lungs, suspicious for multifocal pneumonia. 3. Mild mediastinal adenopathy, likely reactive. 4. Infiltration of the subcutaneous soft tissues of the abdominal pannus, this could be due to nonspecific edema or cellulitis. Correlate with direct inspection. 5. Aortic atherosclerosis. Aortic Atherosclerosis (ICD10-I70.0). Electronically Signed   By: Luke Bun M.D.   On: 01/12/2024 16:01   CT L-SPINE NO CHARGE Result Date: 01/12/2024 CLINICAL DATA:  Fall. EXAM: CT THORACIC AND LUMBAR SPINE WITHOUT CONTRAST TECHNIQUE: Multiplanar CT images of the thoracic and lumbar spine were reconstructed from contemporary CT of the Chest, Abdomen, and Pelvis. RADIATION DOSE REDUCTION: This exam was performed according to the  departmental dose-optimization program which includes automated exposure control, adjustment of the mA and/or kV according to patient size and/or use of iterative reconstruction technique. CONTRAST:  None. COMPARISON:  CT thoracic and lumbar spine 12/27/2023 FINDINGS: CT THORACIC SPINE FINDINGS Alignment: Minimal scoliosis.  No significant listhesis. Vertebrae: Unchanged mild chronic T1, T9, and T11 compression fractures and T12 superior endplate Schmorl's node. No acute fracture or suspicious lesion. Paraspinal and other soft tissues: No acute abnormality in the paraspinal soft tissues. Remainder of the chest reported separately. Disc levels: Mild disc and facet degeneration without evidence of high-grade stenosis. CT LUMBAR SPINE FINDINGS Segmentation: 5 lumbar type vertebrae. Alignment: Mild lumbar levoscoliosis. Unchanged trace retrolisthesis of L2 on L3. Vertebrae: No acute fracture or suspicious lesion. Unchanged mild chronic L2 superior endplate compression fracture. Degenerative changes and chronic Schmorl's node involving the L4 inferior endplate. Paraspinal and other soft tissues: No acute abnormality in the paraspinal soft tissues. Remainder of the abdomen and pelvis reported separately. Disc levels: Similar appearance of multilevel disc degeneration and facet arthrosis compared to the recent prior CT, including severe facet arthrosis on the right at L2-3 and L3-4 and bilaterally at L4-5 and L5-S1. No evidence of high-grade stenosis. IMPRESSION: 1. No acute fracture in the thoracic or lumbar spine. 2. Unchanged mild chronic thoracic and lumbar compression fractures. Electronically Signed   By: Dasie Hamburg M.D.   On: 01/12/2024 16:00   CT T-SPINE NO CHARGE Result Date: 01/12/2024 CLINICAL DATA:  Fall. EXAM: CT THORACIC AND LUMBAR SPINE WITHOUT CONTRAST TECHNIQUE: Multiplanar CT images of the thoracic and lumbar spine were reconstructed from contemporary CT of the Chest, Abdomen, and Pelvis. RADIATION  DOSE REDUCTION: This exam was performed according to the departmental dose-optimization program which includes automated exposure control, adjustment of the mA and/or kV according to patient size and/or use of iterative reconstruction technique. CONTRAST:  None. COMPARISON:  CT thoracic and lumbar spine 12/27/2023 FINDINGS: CT THORACIC SPINE FINDINGS Alignment: Minimal scoliosis.  No significant listhesis. Vertebrae: Unchanged mild chronic T1, T9, and T11 compression fractures and T12 superior endplate Schmorl's node. No acute fracture or suspicious lesion. Paraspinal and other soft tissues: No acute abnormality in the paraspinal soft tissues. Remainder of the chest reported separately. Disc levels: Mild disc and facet degeneration without evidence of high-grade stenosis. CT LUMBAR SPINE FINDINGS Segmentation: 5 lumbar type vertebrae. Alignment: Mild lumbar levoscoliosis. Unchanged trace retrolisthesis of L2 on L3. Vertebrae: No acute fracture or suspicious lesion. Unchanged mild chronic L2 superior endplate compression fracture. Degenerative changes and chronic Schmorl's node involving the L4 inferior endplate. Paraspinal and other soft tissues: No acute abnormality in the paraspinal soft tissues. Remainder of the abdomen and pelvis reported separately. Disc levels: Similar appearance of multilevel disc degeneration and facet arthrosis compared to the recent prior CT, including severe facet arthrosis on the right at L2-3 and L3-4 and bilaterally at L4-5 and L5-S1. No evidence of high-grade stenosis. IMPRESSION: 1. No acute fracture in the thoracic or lumbar spine. 2. Unchanged mild chronic thoracic and lumbar compression fractures. Electronically Signed   By: Dasie Hamburg M.D.   On: 01/12/2024 16:00   CT Head Wo Contrast Result Date: 01/12/2024 CLINICAL DATA:  Head trauma, minor (Age >= 65y); Neck trauma (Age >= 65y). Fall today, hitting head on a table with hematoma. On blood thinner. Additional fall 1 week  ago. EXAM: CT HEAD WITHOUT CONTRAST CT CERVICAL SPINE WITHOUT CONTRAST TECHNIQUE: Multidetector CT imaging of the head and cervical spine was performed following the standard protocol  without intravenous contrast. Multiplanar CT image reconstructions of the cervical spine were also generated. RADIATION DOSE REDUCTION: This exam was performed according to the departmental dose-optimization program which includes automated exposure control, adjustment of the mA and/or kV according to patient size and/or use of iterative reconstruction technique. COMPARISON:  CT head and cervical spine 12/27/2023 FINDINGS: CT HEAD FINDINGS Brain: There is no evidence of an acute infarct, intracranial hemorrhage, mass, midline shift, or extra-axial fluid collection. There is mild generalized cerebral atrophy. Patchy hypodensities in the cerebral white matter are similar to the prior CT and are nonspecific but compatible with mild-to-moderate chronic small vessel ischemic disease. Vascular: Calcified atherosclerosis at the skull base. No hyperdense vessel. Skull: No acute fracture or suspicious lesion. Sinuses/Orbits: Visualized paranasal sinuses and mastoid air cells are clear. Bilateral cataract extraction. Other: Left temporal scalp hematoma. CT CERVICAL SPINE FINDINGS Alignment: Mild reversal of the normal cervical lordosis. Unchanged trace anterolisthesis of C4 on C5. Skull base and vertebrae: No acute fracture or suspicious lesion. Soft tissues and spinal canal: No prevertebral fluid or swelling. No visible canal hematoma. Disc levels: Moderate cervical spondylosis and facet arthrosis. No evidence of high-grade spinal canal stenosis. Moderate left neural foraminal stenosis at C6-7 due to asymmetric uncovertebral spurring. Upper chest: More fully evaluated on the contemporaneous CT of the chest, abdomen, and pelvis. Other: None. IMPRESSION: 1. No evidence of acute intracranial abnormality. 2. Left temporal scalp hematoma. 3. No  acute cervical spine fracture or traumatic malalignment. Electronically Signed   By: Dasie Hamburg M.D.   On: 01/12/2024 15:51   CT Cervical Spine Wo Contrast Result Date: 01/12/2024 CLINICAL DATA:  Head trauma, minor (Age >= 65y); Neck trauma (Age >= 65y). Fall today, hitting head on a table with hematoma. On blood thinner. Additional fall 1 week ago. EXAM: CT HEAD WITHOUT CONTRAST CT CERVICAL SPINE WITHOUT CONTRAST TECHNIQUE: Multidetector CT imaging of the head and cervical spine was performed following the standard protocol without intravenous contrast. Multiplanar CT image reconstructions of the cervical spine were also generated. RADIATION DOSE REDUCTION: This exam was performed according to the departmental dose-optimization program which includes automated exposure control, adjustment of the mA and/or kV according to patient size and/or use of iterative reconstruction technique. COMPARISON:  CT head and cervical spine 12/27/2023 FINDINGS: CT HEAD FINDINGS Brain: There is no evidence of an acute infarct, intracranial hemorrhage, mass, midline shift, or extra-axial fluid collection. There is mild generalized cerebral atrophy. Patchy hypodensities in the cerebral white matter are similar to the prior CT and are nonspecific but compatible with mild-to-moderate chronic small vessel ischemic disease. Vascular: Calcified atherosclerosis at the skull base. No hyperdense vessel. Skull: No acute fracture or suspicious lesion. Sinuses/Orbits: Visualized paranasal sinuses and mastoid air cells are clear. Bilateral cataract extraction. Other: Left temporal scalp hematoma. CT CERVICAL SPINE FINDINGS Alignment: Mild reversal of the normal cervical lordosis. Unchanged trace anterolisthesis of C4 on C5. Skull base and vertebrae: No acute fracture or suspicious lesion. Soft tissues and spinal canal: No prevertebral fluid or swelling. No visible canal hematoma. Disc levels: Moderate cervical spondylosis and facet arthrosis.  No evidence of high-grade spinal canal stenosis. Moderate left neural foraminal stenosis at C6-7 due to asymmetric uncovertebral spurring. Upper chest: More fully evaluated on the contemporaneous CT of the chest, abdomen, and pelvis. Other: None. IMPRESSION: 1. No evidence of acute intracranial abnormality. 2. Left temporal scalp hematoma. 3. No acute cervical spine fracture or traumatic malalignment. Electronically Signed   By: Dasie Hamburg HERO.D.  On: 01/12/2024 15:51   DG Si Joints Result Date: 01/12/2024 CLINICAL DATA:  Chronic sacroiliac pain. EXAM: BILATERAL SACROILIAC JOINTS - 3+ VIEW COMPARISON:  None Available. FINDINGS: The sacroiliac joint spaces are maintained. Minor degenerative spurring the sacroiliac joints. No erosions or bony destructive change. Sacral ala are maintained. No other bone abnormalities are seen. IMPRESSION: Minor degenerative spurring of the sacroiliac joints. Electronically Signed   By: Andrea Gasman M.D.   On: 01/12/2024 14:58   DG Knee 1-2 Views Right Result Date: 01/12/2024 CLINICAL DATA:  Pain and swelling of right knee. EXAM: RIGHT KNEE - 1-2 VIEW COMPARISON:  None Available. FINDINGS: Patient could not tolerate a lateral view. AP only view of the knee submitted. Mild medial tibiofemoral joint space narrowing. Mild peripheral spurring in the tibiofemoral compartments. No evidence of fracture or focal bone abnormality. IMPRESSION: Incomplete exam.  Mild osteoarthritis demonstrated on AP view. Electronically Signed   By: Andrea Gasman M.D.   On: 01/12/2024 14:56   DG Knee Complete 4 Views Right Result Date: 01/12/2024 CLINICAL DATA:  Pain after injury. Fall today. Most of fall week ago. EXAM: RIGHT KNEE - COMPLETE 4+ VIEW COMPARISON:  Frontal view earlier today. FINDINGS: No acute fracture or dislocation. Mild medial tibiofemoral joint space narrowing. Mild tricompartmental peripheral spurring. Large prepatellar soft tissue thickening. Suspect small joint effusion,  not well assessed due to positioning on the lateral view. IMPRESSION: 1. Prominent prepatellar soft tissue thickening. No acute fracture or dislocation. 2. Mild tricompartmental osteoarthritis. Electronically Signed   By: Andrea Gasman M.D.   On: 01/12/2024 14:55   DG Chest 2 View Result Date: 12/30/2023 EXAM: 2 VIEW(S) XRAY OF THE CHEST 12/30/2023 07:54:00 AM COMPARISON: 12/28/2023 CLINICAL HISTORY: L 5th rib Fx, concern for developing PNA. Per triage; Pt reports L sided rib pain from previous fall on 7/18. Has been seen 4 times since. Was given RX of narcotics but reports pain is not getting better. Transfers well. AxO VSS FINDINGS: LUNGS AND PLEURA: Retrocardiac atelectasis or infiltrates. No pneumothorax. No pleural effusion. Pulmonary vascular congestion. HEART AND MEDIASTINUM: Stable enlargement of the cardiomediastinal silhouette. Mitral annular calcification. BONES AND SOFT TISSUES: Known left fifth rib fracture is not well visualized. IMPRESSION: 1. Retrocardiac atelectasis or infiltrates. 2. Stable enlargement of the cardiomediastinal silhouette with mitral annular calcification. 3. Pulmonary vascular congestion. Electronically signed by: Norman Gatlin MD 12/30/2023 08:16 AM EDT RP Workstation: [id number]   DG Chest Port 1 View Result Date: 12/28/2023 CLINICAL DATA:  [name] yesterday, left-sided chest wall pain EXAM: PORTABLE CHEST 1 VIEW COMPARISON:  12/27/2023 FINDINGS: Single frontal view of the chest demonstrates stable enlargement the cardiac silhouette and calcification of the mitral annulus. No acute airspace disease, effusion, or pneumothorax. Right shoulder arthroplasty again noted. There are no acute displaced fractures. Comparison with chest CT performed yesterday reveals a subtle minimally displaced left anterior fifth rib fracture in retrospect, reference image 85/9. IMPRESSION: 1. Subtle minimally displaced left anterior fifth rib fracture is seen on the comparison CT chest from  12/27/2023 in retrospect. This is not visible on today's x-ray. 2. Otherwise no acute intrathoracic process. 3. Stable enlarged cardiac silhouette and mitral annular calcification. Electronically Signed   By: Ozell Daring M.D.   On: 12/28/2023 14:03   CT Chest Wo Contrast Result Date: 12/27/2023 CLINICAL DATA:  Blunt trauma to the chest. EXAM: CT CHEST WITHOUT CONTRAST TECHNIQUE: Multidetector CT imaging of the chest was performed following the standard protocol without IV contrast. RADIATION DOSE REDUCTION: This exam was performed  according to the departmental dose-optimization program which includes automated exposure control, adjustment of the mA and/or kV according to patient size and/or use of iterative reconstruction technique. COMPARISON:  Chest CT dated 06/26/2023. FINDINGS: Evaluation of this exam is limited in the absence of intravenous contrast. Cardiovascular: There is mild cardiomegaly. No pericardial effusion. There is calcification of the mitral annulus. Mild atherosclerotic calcification of the thoracic aorta. No aneurysm or dilatation. The central pulmonary arteries are grossly unremarkable. Mediastinum/Nodes: No hilar or mediastinal adenopathy. The esophagus is grossly unremarkable. No mediastinal fluid collection. Lungs/Pleura: There is chronic interstitial coarsening. Clusters of ground-glass density in the right lower lobe new since the prior CT and may represent developing infiltrate. Right upper lobe interstitial nodularity slightly progressed since the prior CT. No consolidative changes. There is no pleural effusion or pneumothorax. The central airways are patent. Upper Abdomen: No acute abnormality. Musculoskeletal: Right shoulder arthroplasty. No acute osseous pathology. Osteopenia with degenerative changes. IMPRESSION: 1. No acute/traumatic intrathoracic pathology. 2. Clusters of ground-glass density in the right lower lobe new since the prior CT and may represent developing  infiltrate. 3.  Aortic Atherosclerosis (ICD10-I70.0). Electronically Signed   By: Vanetta Chou M.D.   On: 12/27/2023 11:24   CT T-SPINE NO CHARGE Result Date: 12/27/2023 CLINICAL DATA:  Trauma.  Fall. EXAM: CT THORACIC SPINE WITHOUT CONTRAST TECHNIQUE: Multiplanar CT images of the thoracic spine were reconstructed from contemporary CT of the Chest. RADIATION DOSE REDUCTION: This exam was performed according to the departmental dose-optimization program which includes automated exposure control, adjustment of the mA and/or kV according to patient size and/or use of iterative reconstruction technique. CONTRAST:  None. COMPARISON:  CT thoracic spine 11/11/2021. CTA chest, abdomen, and pelvis 06/26/2023. FINDINGS: Alignment: Minimal scoliosis.  No significant listhesis. Vertebrae: Unchanged mild T1, T9, and T11 compression fractures and T12 superior endplate Schmorl's node. No acute fracture or suspicious lesion. Paraspinal and other soft tissues: No acute abnormality in the paraspinal soft tissues. Remainder of the chest reported separately. Disc levels: Mild disc and facet degeneration without evidence of high-grade stenosis. IMPRESSION: 1. No acute osseous abnormality in the thoracic spine. 2. Unchanged multiple mild chronic compression fractures. Electronically Signed   By: Dasie Hamburg M.D.   On: 12/27/2023 11:15   CT Cervical Spine Wo Contrast Result Date: 12/27/2023 EXAM: CT CERVICAL SPINE WITHOUT CONTRAST 12/27/2023 10:47:25 AM TECHNIQUE: CT of the cervical spine was performed without the administration of intravenous contrast. Multiplanar reformatted images are provided for review. Automated exposure control, iterative reconstruction, and/or weight based adjustment of the mA/kV was utilized to reduce the radiation dose to as low as reasonably achievable. COMPARISON: CT of the cervical spine dated 12/18/2023. CLINICAL HISTORY: Polytrauma, blunt. Trauma; from home for fall that happened around 12 am,  fell going to the bathroom, Pt says she doesn't know what cause fall. Endorses hitting the back of her head on the floor, denies LOC. Pt endorses rib pain, chest pain and SOB. FINDINGS: CERVICAL SPINE: BONES AND ALIGNMENT: No acute fracture or traumatic malalignment. Straightening and slight reversal of the normal cervical lordosis is stable. DEGENERATIVE CHANGES: Facet degenerative changes are somewhat more prominent on the left. Uncovertebral spurring is greatest at C6-7, left greater than right. SOFT TISSUES: No prevertebral soft tissue swelling. IMPRESSION: 1. No acute abnormality of the cervical spine related to the reported trauma. 2. Stable straightening and slight reversal of the normal cervical lordosis. 3. Facet degenerative changes, somewhat more prominent on the left. 4. Uncovertebral spurring, greatest at C6-7, left  greater than right. Electronically signed by: Lonni Necessary MD 12/27/2023 11:12 AM EDT RP Workstation: [id number]   CT Lumbar Spine Wo Contrast Result Date: 12/27/2023 CLINICAL DATA:  Low back pain, trauma.  Fall. EXAM: CT LUMBAR SPINE WITHOUT CONTRAST TECHNIQUE: Multidetector CT imaging of the lumbar spine was performed without intravenous contrast administration. Multiplanar CT image reconstructions were also generated. RADIATION DOSE REDUCTION: This exam was performed according to the departmental dose-optimization program which includes automated exposure control, adjustment of the mA and/or kV according to patient size and/or use of iterative reconstruction technique. COMPARISON:  CT abdomen and pelvis 12/25/2023. CT lumbar spine 11/11/2021. FINDINGS: Segmentation: 5 lumbar type vertebrae. Alignment: Mild lumbar levoscoliosis. Unchanged trace retrolisthesis of L2 on L3. Vertebrae: Unchanged mild chronic L2 superior endplate compression fracture. Degenerative endplate changes at L4-5 including unchanged L4 inferior endplate Schmorl's node. No acute fracture or suspicious lesion.  Paraspinal and other soft tissues: Abdominal aortic atherosclerosis without aneurysm. Disc levels: Mild disc space narrowing at L1-2, L3-4, and L4-5. Predominantly mild disc bulging throughout the lumbar spine. Severe facet arthrosis on the right at L2-3 and L3-4 and bilaterally at L4-5 and L5-S1. No evidence of high-grade spinal canal or neural foraminal stenosis. IMPRESSION: 1. No acute osseous abnormality. 2. Unchanged mild chronic L2 compression fracture. 3. Lumbar disc and facet degeneration without evidence of high-grade stenosis. 4.  Aortic Atherosclerosis (ICD10-I70.0). Electronically Signed   By: Dasie Hamburg M.D.   On: 12/27/2023 11:08   CT Head Wo Contrast Result Date: 12/27/2023 EXAM: CT HEAD WITHOUT CONTRAST 12/27/2023 10:47:25 AM TECHNIQUE: CT of the head was performed without the administration of intravenous contrast. Automated exposure control, iterative reconstruction, and/or weight based adjustment of the mA/kV was utilized to reduce the radiation dose to as low as reasonably achievable. COMPARISON: None available. CLINICAL HISTORY: Polytrauma, blunt. Trauma; from home for fall that happened around 12 am, fell going to the bathroom, Pt says she doesn't know what cause fall. Endorses hitting the back of her head on the floor, denies LOC. Pt endorses rib pain, chest pain and SOB. FINDINGS: BRAIN AND VENTRICLES: No acute hemorrhage. Gray-white differentiation is preserved. No hydrocephalus. No extra-axial collection. No mass effect or midline shift. Moderate periventricular subcortical white matter hypoattenuation is similar to the prior exam. Mild generalized atrophy is again noted. ORBITS: Bilateral lens replacements are noted. The globes and orbits are otherwise within normal limits. SINUSES: No acute abnormality. SOFT TISSUES AND SKULL: No acute soft tissue abnormality. No skull fracture. IMPRESSION: 1. No acute intracranial abnormality related to the reported trauma. 2. Moderate  periventricular subcortical white matter hypoattenuation and mild generalized atrophy, similar to the prior exam. This likely reflects the sequelae of chronic microvascular ischemia. Electronically signed by: Lonni Necessary MD 12/27/2023 11:03 AM EDT RP Workstation: [id number]   DG Chest Portable 1 View Result Date: 12/27/2023 CLINICAL DATA:  Fall and left chest wall pain. EXAM: PORTABLE CHEST 1 VIEW COMPARISON:  Chest radiograph dated 12/18/2023. FINDINGS: No focal consolidation, pleural effusion, or pneumothorax. Mild cardiomegaly. No acute osseous pathology. Right shoulder arthroplasty. IMPRESSION: 1. No active disease. 2. Mild cardiomegaly. Electronically Signed   By: Vanetta Chou M.D.   On: 12/27/2023 10:44   CT ABDOMEN PELVIS WO CONTRAST Result Date: 12/25/2023 CLINICAL DATA:  Abdominal pain EXAM: CT ABDOMEN AND PELVIS WITHOUT CONTRAST TECHNIQUE: Multidetector CT imaging of the abdomen and pelvis was performed following the standard protocol without IV contrast. RADIATION DOSE REDUCTION: This exam was performed according to the departmental dose-optimization program which includes  automated exposure control, adjustment of the mA and/or kV according to patient size and/or use of iterative reconstruction technique. COMPARISON:  CT 12/18/2023, 10/23/2023, 10/12/2023 FINDINGS: Lower chest: Lung bases demonstrate incompletely visualized heterogeneous airspace disease in the right middle and lower lobes. Cardiomegaly with mitral annular calcification. Coronary vascular calcification. Hepatobiliary: No focal liver abnormality is seen. No gallstones, gallbladder wall thickening, or biliary dilatation. Pancreas: Unremarkable. No pancreatic ductal dilatation or surrounding inflammatory changes. Spleen: Normal in size without focal abnormality. Adrenals/Urinary Tract: Adrenal glands are normal. Kidneys show no hydronephrosis. Bladder shows possible slight thick wall and minimal perivesical stranding.  Stomach/Bowel: Stomach nonenlarged. No dilated small bowel. Negative appendix. Diverticular disease of the sigmoid colon. Question very subtle fat stranding in the fat adjacent to sigmoid colon, series 5, image 49. And series 2, image 69. Questioned area of sigmoid colon narrowing is less apparent in the absence of contrast, may be visualized on series 2, image 74. Vascular/Lymphatic: Aortic atherosclerosis. No enlarged abdominal or pelvic lymph nodes. Reproductive: Uterus and bilateral adnexa are unremarkable. Other: Negative for pelvic effusion or free air. Fat containing right groin hernia Musculoskeletal: No acute or suspicious osseous abnormality. Mild chronic superior endplate deformities T11 and T12 as well as inferior endplate of T9. IMPRESSION: 1. Diverticular disease of the sigmoid colon. Question very subtle fat stranding in the fat adjacent to the sigmoid colon, cannot exclude very mild acute diverticulitis. Questioned area of sigmoid colon narrowing is less apparent in the absence of contrast, recommend correlation with colonoscopy if not already performed. 2. Possible slight thick wall of the bladder with minimal perivesical stranding, correlate with urinalysis to exclude cystitis. 3. Incompletely visualized heterogeneous airspace disease in the right middle and lower lobes, suspect for pneumonia, possible atypical organism. 4. Cardiomegaly. 5. Aortic atherosclerosis. Aortic Atherosclerosis (ICD10-I70.0). Electronically Signed   By: Luke Bun M.D.   On: 12/25/2023 19:36    Micro Results   Recent Results (from the past 240 hours)  C Difficile Quick Screen w PCR reflex     Status: None   Collection Time: 01/19/24  8:13 AM   Specimen: STOOL  Result Value Ref Range Status   C Diff antigen NEGATIVE NEGATIVE Final   C Diff toxin NEGATIVE NEGATIVE Final   C Diff interpretation No C. difficile detected.  Final    Comment: Performed at [facility], [address], [place], [place] [address]   Gastrointestinal Panel by PCR , Stool     Status: None   Collection Time: 01/19/24  8:13 AM   Specimen: STOOL  Result Value Ref Range Status   Campylobacter species NOT DETECTED NOT DETECTED Final   Plesimonas shigelloides NOT DETECTED NOT DETECTED Final   Salmonella species NOT DETECTED NOT DETECTED Final   Yersinia enterocolitica NOT DETECTED NOT DETECTED Final   Vibrio species NOT DETECTED NOT DETECTED Final   Vibrio cholerae NOT DETECTED NOT DETECTED Final   Enteroaggregative E coli (EAEC) NOT DETECTED NOT DETECTED Final   Enteropathogenic E coli (EPEC) NOT DETECTED NOT DETECTED Final   Enterotoxigenic E coli (ETEC) NOT DETECTED NOT DETECTED Final   Shiga like toxin producing E coli (STEC) NOT DETECTED NOT DETECTED Final   Shigella/Enteroinvasive E coli (EIEC) NOT DETECTED NOT DETECTED Final   Cryptosporidium NOT DETECTED NOT DETECTED Final   Cyclospora cayetanensis NOT DETECTED NOT DETECTED Final   Entamoeba histolytica NOT DETECTED NOT DETECTED Final   Giardia lamblia NOT DETECTED NOT DETECTED Final   Adenovirus F40/41 NOT DETECTED NOT DETECTED Final  Astrovirus NOT DETECTED NOT DETECTED Final   Norovirus GI/GII NOT DETECTED NOT DETECTED Final   Rotavirus A NOT DETECTED NOT DETECTED Final   Sapovirus (I, II, IV, and V) NOT DETECTED NOT DETECTED Final    Comment: Performed at [facility], [address] [place], [place] [address]    Today   Subjective    Amber Stephenson today has no new complaints - Eating and drinking well - No BM today No fever  Or chills  - No nausea or vomiting          Patient has been seen and examined prior to discharge   Objective   Blood pressure 123/68, pulse 96, temperature 99.6 F (37.6 C), temperature source Oral, resp. rate 18, height 5' 2 (1.575 m), weight 81.4 kg, SpO2 96%.   Intake/Output Summary (Last 24 hours) at 01/23/2024 1053 Last data filed at 01/22/2024 1730 Gross per 24 hour  Intake 740 ml  Output --  Net  740 ml    Exam Gen:- Awake Alert, no acute distress  HEENT:- [place].AT, No sclera icterus Neck-Supple Neck,No JVD,.  Lungs-  CTAB , good air movement bilaterally CV- S1, S2 normal, regular Abd-  +ve B.Sounds, Abd Soft, No significant tenderness,    Extremity/Skin:- No  edema,   good pulses Psych-affect is appropriate, oriented x3 Neuro-generalized weakness, no new focal deficits, no tremors    Data Review   CBC w Diff:  Lab Results  Component Value Date   WBC 6.0 01/23/2024   HGB 8.1 (L) 01/23/2024   HGB 7.9 (L) 01/15/2024   HCT 25.9 (L) 01/23/2024   HCT 26.0 (L) 01/15/2024   PLT 210 01/23/2024   PLT 176 01/15/2024   LYMPHOPCT 8 01/21/2024   MONOPCT 13 01/21/2024   EOSPCT 1 01/21/2024   BASOPCT 0 01/21/2024    CMP:  Lab Results  Component Value Date   NA 134 (L) 01/23/2024   NA 126 (L) 01/15/2024   K 4.0 01/23/2024   CL 103 01/23/2024   CO2 23 01/23/2024   BUN 8 01/23/2024   BUN 17 01/15/2024   CREATININE 0.74 01/23/2024   CREATININE 1.20 (H) 03/04/2023   PROT 6.1 (L) 01/20/2024   PROT 6.2 01/15/2024   ALBUMIN  3.1 (L) 01/20/2024   ALBUMIN  3.8 01/15/2024   BILITOT 1.1 01/20/2024   BILITOT 0.4 01/15/2024   BILITOT 0.5 03/04/2023   ALKPHOS 138 (H) 01/20/2024   AST 26 01/20/2024   AST 26 03/04/2023   ALT <5 01/20/2024   ALT 11 03/04/2023  .  Total Discharge time is about 33 minutes  Rendall Carwin M.D on 01/23/2024 at 10:53 AM  Go to [email or web address] -  for contact info  [facility] - Office  [phone]

## 2024-01-23 NOTE — Telephone Encounter (Signed)
 Hi Amber Stephenson,  Can you please schedule a follow up appointment for this patient in 3-4 weeks with Dr. Riley Cheadle or any of the APPs?  Thanks,  Samantha Cress, MD Gastroenterology and Hepatology [facility]

## 2024-01-23 NOTE — Plan of Care (Signed)
 Problem: Education: Goal: Knowledge of General Education information will improve Description: Including pain rating scale, medication(s)/side effects and non-pharmacologic comfort measures 01/23/2024 1046 by Mathews Norleen POUR, RN Outcome: Adequate for Discharge 01/23/2024 [phone] by Mathews Norleen POUR, RN Outcome: Progressing   Problem: Health Behavior/Discharge Planning: Goal: Ability to manage health-related needs will improve 01/23/2024 1046 by Mathews Norleen POUR, RN Outcome: Adequate for Discharge 01/23/2024 0737 by Mathews Norleen POUR, RN Outcome: Progressing   Problem: Clinical Measurements: Goal: Ability to maintain clinical measurements within normal limits will improve 01/23/2024 1046 by Mathews Norleen POUR, RN Outcome: Adequate for Discharge 01/23/2024 0737 by Mathews Norleen POUR, RN Outcome: Progressing Goal: Will remain free from infection 01/23/2024 1046 by Mathews Norleen POUR, RN Outcome: Adequate for Discharge 01/23/2024 [phone] by Mathews Norleen POUR, RN Outcome: Progressing Goal: Diagnostic test results will improve 01/23/2024 1046 by Mathews Norleen POUR, RN Outcome: Adequate for Discharge 01/23/2024 0737 by Mathews Norleen POUR, RN Outcome: Progressing Goal: Respiratory complications will improve 01/23/2024 1046 by Mathews Norleen POUR, RN Outcome: Adequate for Discharge 01/23/2024 0737 by Mathews Norleen POUR, RN Outcome: Progressing Goal: Cardiovascular complication will be avoided 01/23/2024 1046 by Mathews Norleen POUR, RN Outcome: Adequate for Discharge 01/23/2024 0737 by Mathews Norleen POUR, RN Outcome: Progressing   Problem: Activity: Goal: Risk for activity intolerance will decrease 01/23/2024 1046 by Mathews Norleen POUR, RN Outcome: Adequate for Discharge 01/23/2024 [phone] by Mathews Norleen POUR, RN Outcome: Progressing   Problem: Nutrition: Goal: Adequate nutrition will be maintained 01/23/2024 1046 by Mathews Norleen POUR, RN Outcome: Adequate for Discharge 01/23/2024 0737 by Mathews Norleen POUR, RN Outcome: Progressing   Problem: Coping: Goal: Level of anxiety  will decrease 01/23/2024 1046 by Mathews Norleen POUR, RN Outcome: Adequate for Discharge 01/23/2024 0737 by Mathews Norleen POUR, RN Outcome: Progressing   Problem: Elimination: Goal: Will not experience complications related to bowel motility 01/23/2024 1046 by Mathews Norleen POUR, RN Outcome: Adequate for Discharge 01/23/2024 [phone] by Mathews Norleen POUR, RN Outcome: Progressing Goal: Will not experience complications related to urinary retention 01/23/2024 1046 by Mathews Norleen POUR, RN Outcome: Adequate for Discharge 01/23/2024 0737 by Mathews Norleen POUR, RN Outcome: Progressing   Problem: Pain Managment: Goal: General experience of comfort will improve and/or be controlled 01/23/2024 1046 by Mathews Norleen POUR, RN Outcome: Adequate for Discharge 01/23/2024 0737 by Mathews Norleen POUR, RN Outcome: Progressing   Problem: Safety: Goal: Ability to remain free from injury will improve 01/23/2024 1046 by Mathews Norleen POUR, RN Outcome: Adequate for Discharge 01/23/2024 0737 by Mathews Norleen POUR, RN Outcome: Progressing   Problem: Skin Integrity: Goal: Risk for impaired skin integrity will decrease 01/23/2024 1046 by Mathews Norleen POUR, RN Outcome: Adequate for Discharge 01/23/2024 0737 by Mathews Norleen POUR, RN Outcome: Progressing   Problem: Education: Goal: Ability to describe self-care measures that may prevent or decrease complications (Diabetes Survival Skills Education) will improve 01/23/2024 1046 by Mathews Norleen POUR, RN Outcome: Adequate for Discharge 01/23/2024 [phone] by Mathews Norleen POUR, RN Outcome: Progressing Goal: Individualized Educational Video(s) 01/23/2024 1046 by Mathews Norleen POUR, RN Outcome: Adequate for Discharge 01/23/2024 [phone] by Mathews Norleen POUR, RN Outcome: Progressing   Problem: Coping: Goal: Ability to adjust to condition or change in health will improve 01/23/2024 1046 by Mathews Norleen POUR, RN Outcome: Adequate for Discharge 01/23/2024 0737 by Mathews Norleen POUR, RN Outcome: Progressing   Problem: Fluid Volume: Goal: Ability to maintain  a balanced intake and output will improve 01/23/2024 1046 by Mathews Norleen POUR, RN Outcome: Adequate for Discharge 01/23/2024  [phone] by Mathews Norleen POUR, RN Outcome: Progressing   Problem: Health Behavior/Discharge Planning: Goal: Ability to identify and utilize available resources and services will improve 01/23/2024 1046 by Mathews Norleen POUR, RN Outcome: Adequate for Discharge 01/23/2024 0737 by Mathews Norleen POUR, RN Outcome: Progressing Goal: Ability to manage health-related needs will improve 01/23/2024 1046 by Mathews Norleen POUR, RN Outcome: Adequate for Discharge 01/23/2024 0737 by Mathews Norleen POUR, RN Outcome: Progressing   Problem: Metabolic: Goal: Ability to maintain appropriate glucose levels will improve 01/23/2024 1046 by Mathews Norleen POUR, RN Outcome: Adequate for Discharge 01/23/2024 0737 by Mathews Norleen POUR, RN Outcome: Progressing   Problem: Nutritional: Goal: Maintenance of adequate nutrition will improve 01/23/2024 1046 by Mathews Norleen POUR, RN Outcome: Adequate for Discharge 01/23/2024 [phone] by Mathews Norleen POUR, RN Outcome: Progressing Goal: Progress toward achieving an optimal weight will improve 01/23/2024 1046 by Mathews Norleen POUR, RN Outcome: Adequate for Discharge 01/23/2024 0737 by Mathews Norleen POUR, RN Outcome: Progressing   Problem: Skin Integrity: Goal: Risk for impaired skin integrity will decrease 01/23/2024 1046 by Mathews Norleen POUR, RN Outcome: Adequate for Discharge 01/23/2024 0737 by Mathews Norleen POUR, RN Outcome: Progressing   Problem: Tissue Perfusion: Goal: Adequacy of tissue perfusion will improve 01/23/2024 1046 by Mathews Norleen POUR, RN Outcome: Adequate for Discharge 01/23/2024 0737 by Mathews Norleen POUR, RN Outcome: Progressing

## 2024-01-23 NOTE — TOC Transition Note (Signed)
 Transition of Care [facility]) - Discharge Note   Patient Details  Name: Amber Stephenson MRN: [id number] Date of Birth: [birth year]  [facility] of [facility]) CM/SW Contact:  Nena LITTIE Coffee, RN Phone Number: 01/23/2024, 11:00 AM   Clinical Narrative:    Pt declined SNF and will dc c/[facility].  She was active c/[facility] home oxygen  prior to this admission.    Barriers to Discharge: Barriers Resolved  Patient Goals and CMS Choice Patient states their goals for this hospitalization and ongoing recovery are:: return home with home health CMS Medicare.gov Compare Post Acute Care list provided to:: Patient Represenative (must comment) Choice offered to / list presented to : Spouse [name] ownership interest in [facility].provided to:: Spouse    Discharge Placement                       Discharge Plan and Services Additional resources added to the After Visit Summary for                            [facility] Arranged: PT HH Agency: Other - See comment [facility])        Social Drivers of Health (SDOH) Interventions SDOH Screenings   Food Insecurity: No Food Insecurity (01/19/2024)  Housing: Low Risk  (01/19/2024)  Transportation Needs: No Transportation Needs (01/19/2024)  Utilities: Not At Risk (01/19/2024)  Alcohol  Screen: Low Risk  (08/13/2023)  Depression (PHQ2-9): Low Risk  (01/12/2024)  Recent Concern: Depression (PHQ2-9) - High Risk (12/18/2023)  Financial Resource Strain: Low Risk  (08/13/2023)  Physical Activity: Insufficiently Active (08/13/2023)  Social Connections: Moderately Integrated (01/19/2024)  Stress: No Stress Concern Present (08/13/2023)  Tobacco Use: Low Risk  (01/22/2024)  Health Literacy: Adequate Health Literacy (08/13/2023)     Readmission Risk Interventions    01/20/2024    3:33 PM 01/20/2024    3:29 PM 10/09/2023   10:41 AM  Readmission Risk Prevention Plan  Transportation Screening  Complete Complete  Medication Review Special educational needs teacher)  Complete Complete  PCP or Specialist appointment within 3-5 days of discharge  Not Complete   HRI or Home Care Consult  Complete Complete  SW Recovery Care/Counseling Consult Complete Not Complete Complete  Palliative Care Screening  Not Applicable Not Applicable  Skilled Nursing Facility  Not Applicable Not Applicable

## 2024-01-23 NOTE — Discharge Instructions (Addendum)
 1)Sliding scale Short acting Humalog  insulin  per sliding scale 0-10 ---:-- Insulin  injection 0-10 Units 0-10 Units Subcutaneous, 3 times daily with meals CBG 70 - 120: 0 unit CBG 121 - 150: 0 unit  CBG 151 - 200: 1 unit CBG 201 - 250: 2 units CBG 251 - 300: 4 units CBG 301 - 350: 6 units  CBG 351 - 400: 8 units  CBG > 400: 10 units  2)Avoid ibuprofen /Advil /Aleve/Motrin Josefine Powders/Naproxen/BC powders/Meloxicam/Diclofenac /Indomethacin and other Nonsteroidal anti-inflammatory medications as these will make you more likely to bleed and can cause stomach ulcers, can also cause Kidney problems.   3)Please do NOT restart diclofenac  (Voltaren ) due to increased risk of bleeding  4)Watch for bleeding while on Blood Thinners--watch for blood in your stool which can make your stool black, maroon, mahogany or red---, blood in your urine which can make your urine pink or red, nosebleeds , also watch for possible bruising -You are taking Apixaban /Eliquis --- which is a blood thinner--- be careful to avoid injury or falls  5) repeat CBC blood test every Thursday s x 2 consecutive weeks tarting 01/28/2024 advised  6) please follow-up to primary care physician within a week for further adjustment of your insulin  regimen--please keep a diary of your glucose checks/fingersticks and take this with you to your PCP within a week  [facility]   *[facility] will contact you to schedule therapy [address], [address] [place], [place] [address] [phone]

## 2024-01-25 ENCOUNTER — Other Ambulatory Visit: Payer: Self-pay | Admitting: Physician Assistant

## 2024-01-25 ENCOUNTER — Telehealth: Payer: Self-pay

## 2024-01-25 DIAGNOSIS — Z7984 Long term (current) use of oral hypoglycemic drugs: Secondary | ICD-10-CM | POA: Diagnosis not present

## 2024-01-25 DIAGNOSIS — Z7985 Long-term (current) use of injectable non-insulin antidiabetic drugs: Secondary | ICD-10-CM | POA: Diagnosis not present

## 2024-01-25 DIAGNOSIS — Z7901 Long term (current) use of anticoagulants: Secondary | ICD-10-CM | POA: Diagnosis not present

## 2024-01-25 DIAGNOSIS — R296 Repeated falls: Secondary | ICD-10-CM | POA: Diagnosis not present

## 2024-01-25 DIAGNOSIS — L89321 Pressure ulcer of left buttock, stage 1: Secondary | ICD-10-CM | POA: Diagnosis not present

## 2024-01-25 DIAGNOSIS — Z794 Long term (current) use of insulin: Secondary | ICD-10-CM | POA: Diagnosis not present

## 2024-01-25 NOTE — Transitions of Care (Post Inpatient/ED Visit) (Signed)
   01/25/2024  Name: Amber Stephenson MRN: [id number] DOB: 22-Feb-1946  Today's TOC FU Call Status: Today's TOC FU Call Status:: Successful TOC FU Call Completed TOC FU Call Complete Date: 01/25/24 Patient's Name and Date of Birth confirmed.  Transition Care Management Follow-up Telephone Call Date of Discharge: 01/23/24 [facility] Type of Discharge: Inpatient Admission Primary Inpatient Discharge Diagnosis:: ABLA (acute blood loss anemia) How have you been since you were released from the hospital?: Better Any questions or concerns?: No  Items Reviewed: Did you receive and understand the discharge instructions provided?: Yes Medications obtained,verified, and reconciled?: Yes (Medications Reviewed) (states will pick up protonix  today) Any new allergies since your discharge?: No  Home Care and Equipment/Supplies: Were Home Health Services Ordered?: Yes Name of Home Health Agency:: [facility] Has Agency set up a time to come to your home?: Yes First Home Health Visit Date: 01/25/24   Follow up appointments reviewed: PCP Follow-up appointment confirmed?: No (Reviewed importance of labs this week.) MD Provider Line Number:[phone] Given: No  Placed call to patient and explained reason for call. Patient asked me to speak with husband.  Mr Stiner states that they do not need to review discharge instructions, Reports he has already done this himself and with home health today.  Mr. Meliton reports that they have everything they need except protonix  that he will pick up today. Reviewed recommendation for labs this week and encouraged husband to call. He states that he will.  TOC not completed and assessments not completed per decline of husband. Message sent to [facility] nurse Rosina to inform.  Alan Ee, RN, BSN, CEN [employer]- Transition of Care Team.  [facility] [phone]

## 2024-01-25 NOTE — Telephone Encounter (Signed)
Message sent to endo

## 2024-01-26 ENCOUNTER — Encounter: Payer: Self-pay | Admitting: Adult Health

## 2024-01-26 ENCOUNTER — Inpatient Hospital Stay: Admitting: Family Medicine

## 2024-01-26 ENCOUNTER — Ambulatory Visit (INDEPENDENT_AMBULATORY_CARE_PROVIDER_SITE_OTHER): Admitting: Adult Health

## 2024-01-26 ENCOUNTER — Ambulatory Visit: Payer: Self-pay | Admitting: Gastroenterology

## 2024-01-26 ENCOUNTER — Telehealth: Payer: Self-pay | Admitting: Family Medicine

## 2024-01-26 VITALS — BP 132/84 | Temp 97.7°F | Ht 62.0 in | Wt 181.6 lb

## 2024-01-26 DIAGNOSIS — J4520 Mild intermittent asthma, uncomplicated: Secondary | ICD-10-CM

## 2024-01-26 DIAGNOSIS — G4733 Obstructive sleep apnea (adult) (pediatric): Secondary | ICD-10-CM

## 2024-01-26 DIAGNOSIS — K922 Gastrointestinal hemorrhage, unspecified: Secondary | ICD-10-CM | POA: Diagnosis not present

## 2024-01-26 DIAGNOSIS — J9611 Chronic respiratory failure with hypoxia: Secondary | ICD-10-CM

## 2024-01-26 LAB — SURGICAL PATHOLOGY

## 2024-01-26 NOTE — Progress Notes (Signed)
 "  @Patient  ID: Amber Stephenson, female    DOB: [birth year], 78 y.o.   MRN: [id number]  Chief Complaint  Patient presents with   nocturnal hypoxia   Follow-up    Referring provider: [name], MD  HPI: 78 year old female never smoker seen February 2024 to reestablish for sleep apnea, asthma and chronic respiratory failure on oxygen  Medical history significant for sleep apnea, diastolic congestive heart failure, atrial fibrillation on chronic anticoagulation, pulmonary hypertension, PTSD, bipolar disorder and diabetes  TEST/EVENTS :  Home sleep study Oct 09, 2018 showed severe sleep apnea with AHI at 40/hour, and SpO2 low at 82%.    May 2019 chest x-ray showed some basilar scarring versus atelectasis January 2024 portable chest x-ray personally reviewed showing cardiomegaly, low lung volumes, interstitial prominence versus atelectasis bases, likely left pleural effusion January 2023 CT chest images independently reviewed showing normal pulmonary parenchyma, some atelectasis in the lingula, multiple calcified left upper lobe granulomas   PFT: August 2019 ratio normal, FVC 1.09 L 41% predicted, total lung capacity 3.59 L 78% predicted, DLCO 14.1 mL 69% predicted   Overnight oxygen  testing: January 31, 2018 on room air: O2 saturation less than 88% for 25 minutes, O2 saturation nadir 82%   Labs: August 2019 sed rate 24, aldolase normal 4.9, ANA negative, anti-Jo 1-, centromere negative, CCP negative, rheumatoid factor negative, SSA negative, SSB negative, SCL 70 neg   Path:   Echo: September 2016 echocardiogram LVEF 55 to 60%, left atrium severely dilated, trivial mitral regurgitation, PA systolic pressure 25 mmHg September 2019 echocardiogram normal LVEF but RVSP was 46 mmHg January 2024 TTE: LVEF 65 to 70%, mild LVH, RV systolic function is normal, size is normal, left atrium severely dilated valves okay   Heart Catheterization: February 03, 2018 left heart catheterization:  Proximal LAD 40%, mid LAD 30%, first obtuse marginal 30% stenosed, LVEDP moderately elevated (27 mmHg) to     Sleep study: 2020 AHI 40.6, greater than 170 minutes below 88%   Ambulatory oximetry: February 2024 walked 200 feet in clinic on room air and O2 saturation dropped to 84%  01/26/24 Follow up ; Asthma, O2 RF, OSA, Post hospital follow up   Discussed the use of AI scribe software for clinical note transcription with the patient, who gave verbal consent to proceed.  History of Present Illness Amber Stephenson is a 78 year old female who presents for follow-up of her pulmonary issues and recent health events.  She has experienced multiple falls, including a recent fall two weeks ago resulting in a knee injury and a rib fracture. She attributes the fall to tripping on her shoe. She has a history of falls, including a series in July where she fell weekly for three weeks. The cause of these falls is unclear. She denies neuropathy in her feet from diabetes and is not diagnosed with Parkinson's disease. She is currently taking medication for restless legs.  Following the recent fall, she was hospitalized a week ago due to a gastrointestinal bleed of unknown etiology. During her hospital stay, she underwent a colonoscopy.  This showed multiple nonbleeding colonic angiodysplastic lesions.  2 polyps were removed her that surgical pathology was negative for malignant cells.  Endoscopy showed normal esophagus.  Nonbleeding gastric ulcer.  Protonix  dosage was increased to twice daily, NSAIDS were stopped  and her Eliquis  was temporarily held for the procedure but has since been resumed. She also experienced severe diarrhea and hyponatremia.  She has a follow-up appointment with her primary  doctor on Thursday for lab work to check her sodium and electrolytes.  She is currently on home oxygen  therapy, primarily using it at bedtime and as needed during the day. Her oxygen  levels have been stable, remaining  in the 90s. She is also using Advair as needed for respiratory symptoms such as cough and wheeze.   She has been experiencing significant pain and is currently taking pain medication, which can affect her balance. Her right leg, particularly the knee, has been problematic over the past eight months, contributing to her feeling 'pretty banged up'.  She has a history of sleep apnea and was unable to tolerate CPAP therapy. A sleep study was previously ordered but not completed due to the rib fracture and recent hospitalization.  She wants to wait a while and will call us  when she is ready to reschedule.    Allergies  Allergen Reactions   Iodinated Contrast Media Anaphylaxis, Swelling and Other (See Comments)    Throat closes, swelling of throat and tongue   Iodine Anaphylaxis   Latex Other (See Comments)    Latex tape pulls skin with it   Tape Other (See Comments)    Pulls off the skin, if latex   Tizanidine  Other (See Comments)    Weakness, goofy, bad dreams    Immunization History  Administered Date(s) Administered   Fluad Quad(high Dose 65+) 03/04/2019, 04/03/2021, 03/26/2022   Fluad Trivalent(High Dose 65+) 04/21/2023   INFLUENZA, HIGH DOSE SEASONAL PF 03/30/2015, 03/17/2016, 03/19/2017, 02/17/2018   Influenza,inj,Quad PF,6+ Mos 03/30/2015, 03/17/2016, 02/29/2020   Moderna Sars-Covid-2 Vaccination 07/18/2019, 08/15/2019, 05/02/2020   Pneumococcal Conjugate-13 11/12/2017   Pneumococcal Polysaccharide-23 05/30/2015   Tdap 07/17/2011   Zoster Recombinant(Shingrix) 01/30/2021, 04/09/2021   Zoster, Live 07/17/2011, 06/02/2012    Past Medical History:  Diagnosis Date   Allergic rhinitis 02/24/2019   Ambulatory dysfunction 04/23/2020   Atrial fibrillation with RVR (HCC) 05/19/2017   Back pain/sacroiliitis--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   Benign paroxysmal positional vertigo due  to bilateral vestibular disorder 05/20/2019   Bipolar 1 disorder (HCC) 01/23/2015   with GAD, benzo dependence   CAD (coronary artery disease)    Nonobstructive; Managed by Dr. Charls   Cardiomegaly 01/12/2018   CHF (congestive heart failure) 06/08/2022   Chronic back pain 01/04/2015   Chronic constipation 04/25/2020   Chronic diastolic heart failure (HCC) 05/30/2015   Chronic pain syndrome 08/22/2019   back pain, sacroiliitis   Chronic post-traumatic stress disorder (PTSD) 12/06/2020   Diabetic neuropathy (HCC) 02/06/2016   Dyslipidemia 09/24/2020   Essential hypertension    Functional diarrhea 10/26/2020   Herpes genitalis in women 07/16/2015   History of adenomatous polyp of colon 05/21/2019   Overview:   03/31/17: Colonoscopy: nonadvanced adenoma, microscopic colitis, f/u 5 yrs, [facility]/GAP   History of radiation therapy    Endometrial- 02/18/23-03/31/23-Dr. Lynwood Kinard   Hypotension 09/01/2022   Insomnia 01/23/2015   Metabolic acidosis 01/12/2023   Migraine headache with aura 02/12/2016   Myofascial pain dysfunction syndrome 08/22/2019   Non-alcoholic fatty liver disease 01/12/2018   OSA (obstructive sleep apnea) 02/24/2019   10/09/2018 - HST  - AHI 40.6    Osteopenia 12/06/2020   Rx alendronate  35 mg.   Pinguecula 12/06/2020   Presbyopia 12/06/2020   Pulmonary hypertension    RLS (restless legs syndrome) 04/27/2015   RSV (respiratory syncytial virus infection) 06/10/2023   Tremor, essential 12/11/2021  Type II diabetes mellitus (HCC) 09/24/2020    Tobacco History: Social History   Tobacco Use  Smoking Status Never  Smokeless Tobacco Never   Counseling given: Not Answered   Outpatient Medications Prior to Visit  Medication Sig Dispense Refill   acetaminophen  (TYLENOL ) 325 MG tablet Take 2 tablets (650 mg total) by mouth every 6 (six) hours as needed for mild pain (pain score 1-3) or fever (or Fever >/= 101).     ALPRAZolam  (XANAX ) 1 MG tablet Take 1 mg by  mouth at bedtime as needed for anxiety or sleep.     ANUCORT-HC  25 MG suppository Place 1 suppository (25 mg total) rectally daily for 12 days. 12 suppository 0   ARIPiprazole  (ABILIFY ) 2 MG tablet Take 1 tablet (2 mg total) by mouth at bedtime. 30 tablet 5   atorvastatin  (LIPITOR) 80 MG tablet Take 1 tablet (80 mg total) by mouth daily. 90 tablet 3   carbidopa -levodopa  (SINEMET ) 10-100 MG tablet Take 1 tablet by mouth 4 (four) times daily. For restless leg syndrome 120 tablet 5   colchicine  0.6 MG tablet Take 1 tablet (0.6 mg total) by mouth 2 (two) times daily. For mouth sores 60 tablet 1   Continuous Glucose Sensor (DEXCOM G7 SENSOR) MISC CHANGE SENSOR EVERY 10 DAYS 9 each 3   cyanocobalamin  (VITAMIN B12) 1000 MCG tablet Take 1 tablet (1,000 mcg total) by mouth daily. 30 tablet 1   denosumab  (PROLIA ) 60 MG/ML SOSY injection Inject 60 mg into the skin every 6 (six) months. 1 mL 1   diclofenac  Sodium (VOLTAREN ) 1 % GEL Apply 2 g topically 3 (three) times daily as needed. 20 g 1   diphenoxylate -atropine  (LOMOTIL ) 2.5-0.025 MG tablet TAKE 2 TABLETS BY MOUTH FOUR TIMES DAILY AS NEEDED FOR DIARRHEA OR LOOSE STOOLS 30 tablet 5   ELIQUIS  5 MG TABS tablet TAKE 1 TABLET TWICE A DAY 180 tablet 3   escitalopram  (LEXAPRO ) 10 MG tablet Take 10 mg by mouth daily.     furosemide  (LASIX ) 40 MG tablet Take 1 tablet (40 mg total) by mouth daily. 30 tablet 3   gabapentin  (NEURONTIN ) 100 MG capsule Take 1 capsule (100 mg total) by mouth 3 (three) times daily. 90 capsule 3   hydrOXYzine  (VISTARIL ) 25 MG capsule Take 1 capsule (25 mg total) by mouth every 8 (eight) hours as needed for anxiety. 30 capsule 5   insulin  glargine (LANTUS  SOLOSTAR) 100 UNIT/ML Solostar Pen Inject 10 Units into the skin at bedtime. 9 mL 0   insulin  lispro (HUMALOG  KWIKPEN) 100 UNIT/ML KwikPen Inject 0-10 Units into the skin 2 (two) times daily before a meal. Sliding scale Short acting Humalog  insulin  per sliding scale 0-10 ---:-- Insulin   injection 0-10 Units 0-10 Units Subcutaneous, 3 times daily with meals CBG 70 - 120: 0 unit CBG 121 - 150: 0 unit  CBG 151 - 200: 1 unit CBG 201 - 250: 2 units CBG 251 - 300: 4 units CBG 301 - 350: 6 units  CBG 351 - 400: 8 units  CBG > 400: 10 units 18 mL 1   Insulin  Pen Needle 29G X MISC 1 Device by Does not apply route daily in the afternoon. 400 each 3   losartan  (COZAAR ) 25 MG tablet Take 25 mg by mouth daily.     MAGNESIUM  PO Take 1 tablet by mouth daily.     metFORMIN  (GLUCOPHAGE -XR) 500 MG 24 hr tablet Take 2 tablets (1,000 mg total) by mouth daily with breakfast. 90  tablet 3   Methocarbamol  1000 MG TABS Take 1,000 mg by mouth every 6 (six) hours as needed. 120 tablet 5   metoprolol  tartrate (LOPRESSOR ) 50 MG tablet Take 1 tablet (50 mg total) by mouth 2 (two) times daily. 180 tablet 3   ondansetron  (ZOFRAN ) 4 MG tablet Take 1 tablet (4 mg total) by mouth every 8 (eight) hours as needed for nausea or vomiting. For cancer related nausea 30 tablet 1   OXcarbazepine  (TRILEPTAL ) 150 MG tablet Take 1 tablet (150 mg total) by mouth 2 (two) times daily. 60 tablet 0   oxyCODONE  (OXYCONTIN ) 20 mg 12 hr tablet Take 1 tablet (20 mg total) by mouth every 12 (twelve) hours. G89.3- chronic pain- fill 28 days after 8/25- Rx 60 tablet 0   pantoprazole  (PROTONIX ) 40 MG tablet Take 1 tablet (40 mg total) by mouth 2 (two) times daily. 60 tablet 5   potassium chloride  (KLOR-CON ) 10 MEQ tablet Take 1 tablet (10 mEq total) by mouth daily. 90 tablet 3   rOPINIRole  (REQUIP ) 1 MG tablet Take 1 mg by mouth at bedtime.     tirzepatide  (MOUNJARO ) 12.5 MG/0.5ML Pen Inject 12.5 mg into the skin once a week. 6 mL 3   Vitamin D , Cholecalciferol , 25 MCG (1000 UT) TABS Take 1,000 Units by mouth in the morning.     Facility-Administered Medications Prior to Visit  Medication Dose Route Frequency Provider Last Rate Last Admin   bupivacaine -epinephrine  (MARCAINE  W/ EPI) 0.5% -1:200000 injection    Anesthesia Intra-op  Hollis, Kevin D, MD   12 mL at 01/21/19 0935   denosumab  (PROLIA ) injection 60 mg  60 mg Subcutaneous Q6 months [name], MD   60 mg at 01/15/24 1459     Review of Systems:   Constitutional:   No  weight loss, night sweats,  Fevers, chills, +fatigue, or  lassitude.  HEENT:   No headaches,  Difficulty swallowing,  Tooth/dental problems, or  Sore throat,                No sneezing, itching, ear ache, nasal congestion, post nasal drip,   CV:  No chest pain,  Orthopnea, PND, swelling in lower extremities, anasarca, dizziness, palpitations, syncope.   GI  No  bloody stools.   Resp: No  .  No wheezing.  No chest wall deformity  Skin: no rash or lesions.  GU: no dysuria, change in color of urine, no urgency or frequency.  No flank pain, no hematuria   MS:  No joint pain or swelling.  No decreased range of motion.  No back pain.    Physical Exam  BP 132/84 (BP Location: Right Arm, Patient Position: Sitting)   Temp 97.7 F (36.5 C) (Oral)   Ht 5' 2 (1.575 m)   Wt 181 lb 9.6 oz (82.4 kg)   SpO2 96% Comment: RA  BMI 33.22 kg/m   GEN: A/Ox3; pleasant , NAD, well nourished , wheelchair    HEENT:  /AT,   NOSE-clear, THROAT-clear, no lesions, no postnasal drip or exudate noted.   NECK:  Supple w/ fair ROM; no JVD; normal carotid impulses w/o bruits; no thyromegaly or nodules palpated; no lymphadenopathy.    RESP  Clear  P & A; w/o, wheezes/ rales/ or rhonchi. no accessory muscle use, no dullness to percussion  CARD:  RRR, no m/r/g, no peripheral edema, pulses intact, no cyanosis or clubbing.  GI:   Soft & nt; nml bowel sounds; no organomegaly or masses detected.  Musco: Warm bil, no deformities or joint swelling noted.   Neuro: alert, no focal deficits noted.    Skin: Warm, no lesions or rashes    Lab Results:    BMET    Imaging:        Latest Ref Rng & Units 08/27/2022   10:43 AM 01/20/2018    1:58 PM  PFT Results  FVC-Pre L 1.68  1.09    FVC-Predicted Pre % 65  41   FVC-Post L 1.61    FVC-Predicted Post % 62    Pre FEV1/FVC % % 83  83   Post FEV1/FCV % % 85    FEV1-Pre L 1.40  0.90   FEV1-Predicted Pre % 72  45   FEV1-Post L 1.36    DLCO uncorrected ml/min/mmHg 11.34  14.09   DLCO UNC% % 63  69   DLCO corrected ml/min/mmHg 11.27  14.18   DLCO COR %Predicted % 63  70   DLVA Predicted % 106  125   TLC L 3.20  3.59   TLC % Predicted % 67  78   RV % Predicted % 55  84     No results found for: NITRICOXIDE      Assessment & Plan:   No problem-specific Assessment & Plan notes found for this encounter.  Assessment and Plan Assessment & Plan  Asthma -use Advair as needed for asthma flare.  Albuterol  inhaler as needed.  Asthma action plan discussed.  Recent gastrointestinal bleeding, resolved diarrhea, and hyponatremia   Recent gastrointestinal bleeding of unknown etiology occurred. Severe diarrhea resolved after three days Protonix  increased to twice daily, and Eliquis  was resumed after being held for a procedure. Follow up with primary care for lab work to check sodium and electrolytes on Thursday.    Chronic respiratory failure requiring home oxygen    Chronic respiratory failure is managed with home oxygen , used primarily at bedtime and as needed during the day. Oxygen  saturation levels remain in the 90s. Continue home oxygen  therapy as currently prescribed.  Obstructive sleep apnea, not currently on CPAP   Obstructive sleep apnea was previously managed with CPAP, which she could not tolerate. A sleep study was planned but postponed due to recent health issues, including a rib fracture. Reschedule the home sleep study once she feels ready.  History of multiple recent falls   She has experienced multiple falls over the past few months with no clear cause identified.. Exercise caution with pain medication to prevent balance issues. Complete physical therapy to improve strength and balance.  Anemia and  hyponatremia.  Follow-up with primary care this week for follow-up and labs.   Madelin Stank, NP 01/26/2024  "

## 2024-01-26 NOTE — Telephone Encounter (Signed)
 VERBAL OKAY GIVEN FOR PHYSICAL THERAPY ORDERS

## 2024-01-26 NOTE — Telephone Encounter (Signed)
 Copied from CRM #[id number]. Topic: Clinical - Home Health Verbal Orders >> Jan 26, 2024  3:35 PM Mia F wrote: Caller/Agency: Zebedee from [facility] Callback Number: [phone] Service Requested: Physical Therapy Frequency: Two times for three weeks and once for three weeks  Any new concerns about the patient? No

## 2024-01-26 NOTE — Patient Instructions (Addendum)
 Call our office when ready for home sleep study.     Continue on Oxygen  2l/m with activity and At bedtime , to keep O2 sats >88-90%.   Asthma action plan :  Use Advair Twice daily  if cough/Wheezing develops on regular basis.  Albuterol  inhaler As needed    Follow up with PCP for labs on Thursday as planned   PT as planned this week.   Follow up with Dr. Neda or Alyus Mofield in 3 months and As needed

## 2024-01-27 ENCOUNTER — Telehealth: Payer: Self-pay

## 2024-01-27 ENCOUNTER — Encounter (HOSPITAL_COMMUNITY)

## 2024-01-27 NOTE — Telephone Encounter (Signed)
 Copied from CRM [id number]. Topic: Clinical - Prescription Issue >> Jan 27, 2024 10:02 AM Harlene ORN wrote: Reason for CRM: Patient called. She is requesting a prescriptoin of mouthwash that is like the one she was given in the hospital that she says genuinely works, called [facility]. If approved by her PCP, please send order to her [place].

## 2024-01-28 ENCOUNTER — Telehealth: Payer: Self-pay | Admitting: Family Medicine

## 2024-01-28 ENCOUNTER — Encounter: Payer: Self-pay | Admitting: *Deleted

## 2024-01-28 ENCOUNTER — Telehealth: Payer: Self-pay | Admitting: *Deleted

## 2024-01-28 ENCOUNTER — Encounter: Payer: Self-pay | Admitting: Family Medicine

## 2024-01-28 ENCOUNTER — Ambulatory Visit (INDEPENDENT_AMBULATORY_CARE_PROVIDER_SITE_OTHER): Admitting: Family Medicine

## 2024-01-28 ENCOUNTER — Other Ambulatory Visit: Payer: Self-pay | Admitting: Family Medicine

## 2024-01-28 VITALS — BP 138/79 | HR 80 | Temp 98.0°F | Ht 62.0 in

## 2024-01-28 DIAGNOSIS — R531 Weakness: Secondary | ICD-10-CM

## 2024-01-28 DIAGNOSIS — R197 Diarrhea, unspecified: Secondary | ICD-10-CM

## 2024-01-28 DIAGNOSIS — Z794 Long term (current) use of insulin: Secondary | ICD-10-CM

## 2024-01-28 DIAGNOSIS — M25461 Effusion, right knee: Secondary | ICD-10-CM

## 2024-01-28 DIAGNOSIS — N179 Acute kidney failure, unspecified: Secondary | ICD-10-CM

## 2024-01-28 DIAGNOSIS — E871 Hypo-osmolality and hyponatremia: Secondary | ICD-10-CM

## 2024-01-28 DIAGNOSIS — K921 Melena: Secondary | ICD-10-CM

## 2024-01-28 DIAGNOSIS — Z7901 Long term (current) use of anticoagulants: Secondary | ICD-10-CM

## 2024-01-28 DIAGNOSIS — D62 Acute posthemorrhagic anemia: Secondary | ICD-10-CM

## 2024-01-28 DIAGNOSIS — R296 Repeated falls: Secondary | ICD-10-CM

## 2024-01-28 DIAGNOSIS — F1120 Opioid dependence, uncomplicated: Secondary | ICD-10-CM

## 2024-01-28 DIAGNOSIS — K1379 Other lesions of oral mucosa: Secondary | ICD-10-CM

## 2024-01-28 DIAGNOSIS — K253 Acute gastric ulcer without hemorrhage or perforation: Secondary | ICD-10-CM

## 2024-01-28 DIAGNOSIS — M25561 Pain in right knee: Secondary | ICD-10-CM

## 2024-01-28 DIAGNOSIS — E1165 Type 2 diabetes mellitus with hyperglycemia: Secondary | ICD-10-CM

## 2024-01-28 MED ORDER — NYSTATIN 100000 UNIT/ML MT SUSP: 5.0000 mL | Freq: Three times a day (TID) | OROMUCOSAL | 0 refills | Status: DC | PRN

## 2024-01-28 NOTE — Telephone Encounter (Signed)
 Contacted patient. Notified patient. Patient verbalized understanding.

## 2024-01-28 NOTE — Patient Instructions (Signed)
 Verneita JONETTA Gambler - I am sorry I was unable to reach you today for our scheduled appointment. I work with [name], MD and am calling to support your healthcare needs. Please contact me at [phone] at your earliest convenience. I look forward to speaking with you soon.   Thank you,  Rosina Forte, BSN RN [facility], [facility] RN Care Manager Direct Dial : [phone]  Fax: [phone]

## 2024-01-28 NOTE — Telephone Encounter (Signed)
 Please let the patient know that I sent their prescription to their pharmacy. Thanks, WS

## 2024-01-28 NOTE — Progress Notes (Signed)
 Established Patient Office Visit  Subjective   Patient ID: Amber Stephenson, female    DOB: [birth year]  Age: 78 y.o. MRN: [id number]  Chief Complaint  Patient presents with   Transitions Of Care    HPI  History of Present Illness   Amber Stephenson is a 78 year old female who presents for a hospital follow-up after recent admission for diarrhea and weakness.  Today's visit was for Transitional Care Management.  The patient was discharged from [facility] on 01/23/24 with a primary diagnosis of acute blood loss anemia.   Contact with the patient and/or caregiver, by a clinical staff member, was made on 01/25/24 and was documented as a telephone encounter within the EMR.  Through chart review and discussion with the patient I have determined that management of their condition is of high complexity.      Gastrointestinal bleeding and diarrhea - Hospitalized from August 19th to August 23rd for diarrhea and weakness with hemoglobin drop from 11.7 to 7.6 g/dL due to gastrointestinal bleeding - Colonoscopy and EGD revealed normal esophagus and stomach ulcers without active bleeding - Protonix  dose increased to twice daily - Eliquis  was held during hospitalization and restarted on August 22nd - Diarrhea has improved since discharge - Stool cultures negative - No rectal bleeding since discharge  Electrolyte imbalance and diuretic management - Hyponatremia during hospitalization - Hydrochlorothiazide  discontinued and switched to Lasix  with potassium supplementation - Potassium supplement available at home  Glycemic control and hypoglycemia - Long-acting insulin  dose reduced from 30 units to 10 units - Fewer hypoglycemic episodes since dose adjustment - One recent hypoglycemic episode with blood glucose of 71 mg/dL, resolved with food - Blood glucose levels stable around 130 mg/dL - No blood glucose readings over 200 mg/dL  Dizziness and lightheadedness - Persistent dizziness and  lightheadedness since discharge - No falls since returning home - Home physical therapy evaluation scheduled for tomorrow  Right knee pain - Awaiting orthopedic consultation on September 4th following a fall - Uses Voltaren  gel for pain management, avoiding application to open skin - Swelling present in feet  Oral mucositis - Sores in mouth, initially treated with Magic Mouthwash during hospitalization - Request for Magic Mouthwash refill  Iron deficiency evaluation - Not currently taking iron supplementation - Iron levels to be checked to determine need for supplementation        ROS As per HPI.    Objective:     BP 138/79   Pulse 80   Temp 98 F (36.7 C) (Temporal)   Ht 5' 2 (1.575 m)   SpO2 98%   BMI 33.22 kg/m    Physical Exam Vitals and nursing note reviewed.  Constitutional:      General: She is not in acute distress.    Appearance: She is not ill-appearing, toxic-appearing or diaphoretic.  HENT:     Mouth/Throat:     Mouth: Mucous membranes are moist. Oral lesions (aphtahous ulcers present) present.     Pharynx: Oropharynx is clear.  Cardiovascular:     Rate and Rhythm: Normal rate and regular rhythm.     Heart sounds: Normal heart sounds. No murmur heard. Pulmonary:     Effort: Pulmonary effort is normal. No respiratory distress.     Breath sounds: Normal breath sounds. No wheezing, rhonchi or rales.  Musculoskeletal:     Right knee: Swelling and effusion present. No erythema or ecchymosis. Tenderness present.     Right lower leg: No edema.  Left lower leg: No edema.  Skin:    General: Skin is warm and dry.     Findings: Bruising (left side of face) present.  Neurological:     Mental Status: She is alert and oriented to person, place, and time. Mental status is at baseline.     Gait: Gait abnormal (arrives in wheelchair).  Psychiatric:        Mood and Affect: Mood normal.        Behavior: Behavior normal.      No results found for any  visits on 01/28/24.    The 10-year ASCVD risk score (Arnett DK, et al., 2019) is: 44.5%    Assessment & Plan:   [name] was seen today for transitions of care.  Diagnoses and all orders for this visit:  ABLA (acute blood loss anemia) Hematochezia Hematochezia resolved. Anemia secondary to gastrointestinal bleeding with hemoglobin dropping to 7.6 during hospitalization. - Order hemoglobin and kidney function tests today. - Order CBC for today and next week to monitor hemoglobin. - Follow up with GI -     CBC with Differential/Platelet -     Iron, TIBC and Ferritin Panel -     CBC with Differential/Platelet; Future  Acute gastric ulcer without hemorrhage or perforation Continue protonix . Follow up with GI.   AKI (acute kidney injury) (HCC) Avoid NSAIDs. Will repeat BMP today.  -     BMP8+EGFR  Type 2 diabetes mellitus with hyperglycemia, with long-term current use of insulin  (HCC) Type 2 diabetes mellitus with recent adjustment of long-acting insulin  from 30 units to 10 units. Blood sugar has been well controlled with minimal hypoglycemia events - Follow up with endocrinology   Diarrhea, unspecified type Resolved.   Hyponatremia Will repeat BMP today.   Generalized weakness Frequent falls Home PT has been ordered. Will have evaluation tomorrow.   Pain and swelling of right knee Opioid dependence Keep ortho appt for further evaluation. Avoid NSAIDs. On chronic pain medication. RICE therapy, voltaren  gel.   Mouth sores -   magic mouthwash (nystatin , lidocaine , diphenhydrAMINE ) suspension; Take 5 mLs by mouth 3 (three) times daily as needed for mouth pain.  Chronic anticoagulation Continue eliquis .    Follow up in 1 week for repeat CBC.   The patient indicates understanding of these issues and agrees with the plan.   Amber CHRISTELLA Search, FNP

## 2024-01-28 NOTE — Telephone Encounter (Unsigned)
 Copied from CRM [id number]. Topic: Clinical - Prescription Issue >> Jan 27, 2024 10:02 AM Harlene ORN wrote: Reason for CRM: Patient called. She is requesting a prescriptoin of mouthwash that is like the one she was given in the hospital that she says genuinely works, called [facility]. If approved by her PCP, please send order to her [place]. >> Jan 28, 2024  9:57 AM Terri G wrote: Patient is calling back again regarding her mouth wash she was given in the hospital called  Magic mouth she stated Dr.Stacks was supposed to call it in her for and her mouthsores are bothering her really bad. Contact [phone]

## 2024-01-28 NOTE — Telephone Encounter (Signed)
 DUPLICATE MESSAGE, AWAITING PROVIDER RESPONSE

## 2024-01-29 ENCOUNTER — Emergency Department (HOSPITAL_COMMUNITY)

## 2024-01-29 ENCOUNTER — Ambulatory Visit (HOSPITAL_COMMUNITY): Admit: 2024-01-29 | Admitting: Internal Medicine

## 2024-01-29 ENCOUNTER — Telehealth: Payer: Self-pay | Admitting: Cardiology

## 2024-01-29 ENCOUNTER — Other Ambulatory Visit: Payer: Self-pay

## 2024-01-29 ENCOUNTER — Emergency Department (HOSPITAL_COMMUNITY)
Admission: EM | Admit: 2024-01-29 | Discharge: 2024-01-29 | Disposition: A | Discharge status: Home / Self Care | Attending: Emergency Medicine | Admitting: Emergency Medicine

## 2024-01-29 ENCOUNTER — Encounter (HOSPITAL_COMMUNITY): Payer: Self-pay

## 2024-01-29 ENCOUNTER — Encounter (HOSPITAL_COMMUNITY): Payer: Self-pay | Admitting: *Deleted

## 2024-01-29 ENCOUNTER — Ambulatory Visit: Admitting: Family Medicine

## 2024-01-29 ENCOUNTER — Ambulatory Visit: Payer: Self-pay

## 2024-01-29 DIAGNOSIS — M47812 Spondylosis without myelopathy or radiculopathy, cervical region: Secondary | ICD-10-CM | POA: Diagnosis not present

## 2024-01-29 DIAGNOSIS — Z7984 Long term (current) use of oral hypoglycemic drugs: Secondary | ICD-10-CM | POA: Diagnosis not present

## 2024-01-29 DIAGNOSIS — L89321 Pressure ulcer of left buttock, stage 1: Secondary | ICD-10-CM | POA: Diagnosis not present

## 2024-01-29 DIAGNOSIS — M1711 Unilateral primary osteoarthritis, right knee: Secondary | ICD-10-CM | POA: Diagnosis not present

## 2024-01-29 DIAGNOSIS — Z7901 Long term (current) use of anticoagulants: Secondary | ICD-10-CM | POA: Diagnosis not present

## 2024-01-29 DIAGNOSIS — Z91040 Latex allergy status: Secondary | ICD-10-CM | POA: Insufficient documentation

## 2024-01-29 DIAGNOSIS — Z794 Long term (current) use of insulin: Secondary | ICD-10-CM | POA: Diagnosis not present

## 2024-01-29 DIAGNOSIS — I6782 Cerebral ischemia: Secondary | ICD-10-CM | POA: Diagnosis not present

## 2024-01-29 DIAGNOSIS — S0990XA Unspecified injury of head, initial encounter: Secondary | ICD-10-CM | POA: Insufficient documentation

## 2024-01-29 DIAGNOSIS — S8991XA Unspecified injury of right lower leg, initial encounter: Secondary | ICD-10-CM | POA: Diagnosis not present

## 2024-01-29 DIAGNOSIS — R296 Repeated falls: Secondary | ICD-10-CM | POA: Diagnosis not present

## 2024-01-29 DIAGNOSIS — M25531 Pain in right wrist: Secondary | ICD-10-CM | POA: Diagnosis not present

## 2024-01-29 DIAGNOSIS — M4802 Spinal stenosis, cervical region: Secondary | ICD-10-CM | POA: Diagnosis not present

## 2024-01-29 DIAGNOSIS — R519 Headache, unspecified: Secondary | ICD-10-CM | POA: Diagnosis present

## 2024-01-29 DIAGNOSIS — S8001XA Contusion of right knee, initial encounter: Secondary | ICD-10-CM | POA: Diagnosis not present

## 2024-01-29 DIAGNOSIS — S199XXA Unspecified injury of neck, initial encounter: Secondary | ICD-10-CM | POA: Diagnosis not present

## 2024-01-29 DIAGNOSIS — S5001XA Contusion of right elbow, initial encounter: Secondary | ICD-10-CM | POA: Insufficient documentation

## 2024-01-29 DIAGNOSIS — M25561 Pain in right knee: Secondary | ICD-10-CM | POA: Diagnosis not present

## 2024-01-29 DIAGNOSIS — M25521 Pain in right elbow: Secondary | ICD-10-CM | POA: Diagnosis not present

## 2024-01-29 DIAGNOSIS — W01198A Fall on same level from slipping, tripping and stumbling with subsequent striking against other object, initial encounter: Secondary | ICD-10-CM | POA: Diagnosis not present

## 2024-01-29 DIAGNOSIS — Z7985 Long-term (current) use of injectable non-insulin antidiabetic drugs: Secondary | ICD-10-CM | POA: Diagnosis not present

## 2024-01-29 DIAGNOSIS — R600 Localized edema: Secondary | ICD-10-CM | POA: Diagnosis not present

## 2024-01-29 SURGERY — COLONOSCOPY
Anesthesia: Choice

## 2024-01-29 MED ORDER — OXYCODONE HCL ER 10 MG PO T12A
20.0000 mg | EXTENDED_RELEASE_TABLET | Freq: Once | ORAL | Status: AC
  Administered 2024-01-29: 20 mg via ORAL
  Filled 2024-01-29: qty 2

## 2024-01-29 MED ORDER — ONDANSETRON 4 MG PO TBDP
4.0000 mg | ORAL_TABLET | Freq: Once | ORAL | Status: AC
  Administered 2024-01-29: 4 mg via ORAL
  Filled 2024-01-29: qty 1

## 2024-01-29 MED ORDER — POTASSIUM CHLORIDE ER 10 MEQ PO TBCR: 10.0000 meq | EXTENDED_RELEASE_TABLET | Freq: Every day | ORAL | 1 refills | Status: AC

## 2024-01-29 MED ORDER — HYDROMORPHONE HCL 1 MG/ML IJ SOLN
1.0000 mg | Freq: Once | INTRAMUSCULAR | Status: AC
  Administered 2024-01-29: 1 mg via INTRAMUSCULAR
  Filled 2024-01-29: qty 1

## 2024-01-29 NOTE — ED Provider Notes (Signed)
 [place] EMERGENCY DEPARTMENT AT [facility] Provider Note   CSN: [id number] Arrival date & time: 01/29/24  1536     Patient presents with: Amber Stephenson is a 78 y.o. female.   Patient is a 78 year old female who presents to the emergency department with a chief complaint of pain to the right wrist, elbow, knee, as well as associated headache.  Patient notes that she had a mechanical fall earlier today when she tripped after a doctor's appointment.  She notes that she hit her head on a door frame as well as fell directly on her right knee.  She notes that she did have a fall on this affected knee a few weeks ago and has been experiencing pain in it since that point.  Patient notes that she had no preceding symptoms to include dizziness, lightheadedness, syncope, chest pain, shortness of breath or palpitations.  She denies any active chest pain, abdominal pain at this point.  She denies any pain over her thoracic or lumbar spine.  She does admit to some mild pain to her neck.  She denies any associated numbness, paresthesias or unilateral weakness.  She notes that she is due to follow-up with orthopedics in 5 days regarding her knee from her previous fall.  She is currently on Eliquis  and has been compliant with it.   Fall       Prior to Admission medications   Medication Sig Start Date End Date Taking? Authorizing Provider  acetaminophen  (TYLENOL ) 325 MG tablet Take 2 tablets (650 mg total) by mouth every 6 (six) hours as needed for mild pain (pain score 1-3) or fever (or Fever >/= 101). 01/23/24   Emokpae, Courage, MD  ALPRAZolam  (XANAX ) 1 MG tablet Take 1 mg by mouth at bedtime as needed for anxiety or sleep. 12/21/23   [provider]  ANUCORT-HC  25 MG suppository Place 1 suppository (25 mg total) rectally daily for 12 days. 01/23/24 02/04/24  Pearlean Manus, MD  ARIPiprazole  (ABILIFY ) 2 MG tablet Take 1 tablet (2 mg total) by mouth at bedtime. 01/23/24    Pearlean Manus, MD  atorvastatin  (LIPITOR) 80 MG tablet Take 1 tablet (80 mg total) by mouth daily. 01/23/24 04/22/24  Pearlean Manus, MD  carbidopa -levodopa  (SINEMET ) 10-100 MG tablet Take 1 tablet by mouth 4 (four) times daily. For restless leg syndrome 01/23/24   Pearlean Manus, MD  colchicine  0.6 MG tablet Take 1 tablet (0.6 mg total) by mouth 2 (two) times daily. For mouth sores 01/07/24   [name], MD  Continuous Glucose Sensor (DEXCOM G7 SENSOR) MISC CHANGE SENSOR EVERY 10 DAYS 09/28/23   Shamleffer, Donell Cardinal, MD  cyanocobalamin  (VITAMIN B12) 1000 MCG tablet Take 1 tablet (1,000 mcg total) by mouth daily. 09/04/22   Ghimire, Donalda HERO, MD  denosumab  (PROLIA ) 60 MG/ML SOSY injection Inject 60 mg into the skin every 6 (six) months. 08/14/20   [name], MD  diclofenac  Sodium (VOLTAREN ) 1 % GEL Apply 2 g topically 3 (three) times daily as needed. 01/01/24   Dettinger, Fonda LABOR, MD  diphenoxylate -atropine  (LOMOTIL ) 2.5-0.025 MG tablet TAKE 2 TABLETS BY MOUTH FOUR TIMES DAILY AS NEEDED FOR DIARRHEA OR LOOSE STOOLS 11/24/23   [name], MD  ELIQUIS  5 MG TABS tablet TAKE 1 TABLET TWICE A DAY 11/02/23   Shlomo Wilbert SAUNDERS, MD  escitalopram  (LEXAPRO ) 10 MG tablet Take 10 mg by mouth daily.    [provider]  furosemide  (LASIX ) 40 MG tablet Take 1 tablet (  40 mg total) by mouth daily. 01/23/24   Pearlean Manus, MD  gabapentin  (NEURONTIN ) 100 MG capsule Take 1 capsule (100 mg total) by mouth 3 (three) times daily. 01/23/24   Pearlean Manus, MD  hydrOXYzine  (VISTARIL ) 25 MG capsule Take 1 capsule (25 mg total) by mouth every 8 (eight) hours as needed for anxiety. 01/23/24   Pearlean Manus, MD  insulin  glargine (LANTUS  SOLOSTAR) 100 UNIT/ML Solostar Pen Inject 10 Units into the skin at bedtime. 01/23/24   Pearlean Manus, MD  insulin  lispro (HUMALOG  KWIKPEN) 100 UNIT/ML KwikPen Inject 0-10 Units into the skin 2 (two) times daily before a meal. Sliding scale Short acting Humalog   insulin  per sliding scale 0-10 ---:-- Insulin  injection 0-10 Units 0-10 Units Subcutaneous, 3 times daily with meals CBG 70 - 120: 0 unit CBG 121 - 150: 0 unit  CBG 151 - 200: 1 unit CBG 201 - 250: 2 units CBG 251 - 300: 4 units CBG 301 - 350: 6 units  CBG 351 - 400: 8 units  CBG > 400: 10 units 01/23/24   Emokpae, Courage, MD  Insulin  Pen Needle 29G X MISC 1 Device by Does not apply route daily in the afternoon. 05/22/23   Shamleffer, Ibtehal Jaralla, MD  losartan  (COZAAR ) 25 MG tablet Take 25 mg by mouth daily. 02/14/23   [provider]  magic mouthwash (nystatin , lidocaine , diphenhydrAMINE ) suspension Take 5 mLs by mouth 3 (three) times daily as needed for mouth pain. 01/28/24   [name], MD  MAGNESIUM  PO Take 1 tablet by mouth daily.    [provider]  metFORMIN  (GLUCOPHAGE -XR) 500 MG 24 hr tablet Take 2 tablets (1,000 mg total) by mouth daily with breakfast. 01/23/24   Pearlean, Courage, MD  Methocarbamol  1000 MG TABS Take 1,000 mg by mouth every 6 (six) hours as needed. 08/31/23   Lovorn, Megan, MD  metoprolol  tartrate (LOPRESSOR ) 50 MG tablet Take 1 tablet (50 mg total) by mouth 2 (two) times daily. 01/23/24   Pearlean Manus, MD  ondansetron  (ZOFRAN ) 4 MG tablet Take 1 tablet (4 mg total) by mouth every 8 (eight) hours as needed for nausea or vomiting. For cancer related nausea 01/01/24   Dettinger, Fonda LABOR, MD  OXcarbazepine  (TRILEPTAL ) 150 MG tablet Take 1 tablet (150 mg total) by mouth 2 (two) times daily. 06/13/22   Sherrill Cable Latif, DO  oxyCODONE  (OXYCONTIN ) 20 mg 12 hr tablet Take 1 tablet (20 mg total) by mouth every 12 (twelve) hours. G89.3- chronic pain- fill 28 days after 8/25- Rx 01/08/24   Lovorn, Megan, MD  pantoprazole  (PROTONIX ) 40 MG tablet Take 1 tablet (40 mg total) by mouth 2 (two) times daily. 01/23/24   Pearlean Manus, MD  potassium chloride  (KLOR-CON ) 10 MEQ tablet Take 1 tablet (10 mEq total) by mouth daily. 01/29/24   Shlomo Wilbert SAUNDERS, MD   rOPINIRole  (REQUIP ) 1 MG tablet Take 1 mg by mouth at bedtime.    [provider]  tirzepatide  (MOUNJARO ) 12.5 MG/0.5ML Pen Inject 12.5 mg into the skin once a week. 09/21/23   Shamleffer, Donell Cardinal, MD  Vitamin D , Cholecalciferol , 25 MCG (1000 UT) TABS Take 1,000 Units by mouth in the morning. 07/26/20   [provider]    Allergies: Iodinated contrast media, Iodine, Latex, Tape, and Tizanidine     Review of Systems  Musculoskeletal:        Pain to right elbow, wrist, knee  All other systems reviewed and are negative.   Updated Vital Signs BP ROLLEN)  160/88   Pulse 95   Temp (!) 97.3 F (36.3 C)   Resp 20   SpO2 99%   Physical Exam Vitals and nursing note reviewed.  Constitutional:      General: She is not in acute distress.    Appearance: Normal appearance. She is not ill-appearing.  HENT:     Head: Normocephalic and atraumatic.     Nose: Nose normal.     Mouth/Throat:     Mouth: Mucous membranes are moist.  Eyes:     Extraocular Movements: Extraocular movements intact.     Conjunctiva/sclera: Conjunctivae normal.     Pupils: Pupils are equal, round, and reactive to light.  Neck:     Comments: Mild midline tenderness with no step-off or deformity Cardiovascular:     Rate and Rhythm: Normal rate and regular rhythm.     Pulses: Normal pulses.     Heart sounds: Normal heart sounds. No murmur heard.    No gallop.  Pulmonary:     Effort: Pulmonary effort is normal. No respiratory distress.     Breath sounds: Normal breath sounds. No stridor. No wheezing, rhonchi or rales.  Chest:     Chest wall: No tenderness.  Abdominal:     General: Abdomen is flat. Bowel sounds are normal. There is no distension.     Palpations: Abdomen is soft.     Tenderness: There is no abdominal tenderness. There is no guarding.  Musculoskeletal:        General: Normal range of motion.     Cervical back: Normal range of motion and neck supple. No rigidity.     Comments:  Tender to palpation noted over right wrist and right elbow, nontender palpation over right shoulder, nontender palpation over left upper extremity diffusely, radial pulse 2+ distally in bilateral upper extremities, full range of motion noted throughout, sensation intact distally, tenderness palpation noted over the right knee diffusely, nontender palpation of the right foot, ankle, hip, nontender palpation of the left lower extremity diffusely, pelvis stable to AP lateral compression, ecchymosis noted over the anterior aspect of the right knee with no overlying erythema, warmth, edema noted to bilateral lower extremities which is equal, intact passive and active range of motion throughout, sensation intact distally, no obvious deformity, nontender palpation of the thoracic and lumbar spine, no step-off or deformity  Skin:    General: Skin is warm and dry.  Neurological:     General: No focal deficit present.     Mental Status: She is alert and oriented to person, place, and time. Mental status is at baseline.     Cranial Nerves: No cranial nerve deficit.     Sensory: No sensory deficit.     Motor: No weakness.     Coordination: Coordination normal.     Gait: Gait normal.  Psychiatric:        Mood and Affect: Mood normal.        Behavior: Behavior normal.        Thought Content: Thought content normal.        Judgment: Judgment normal.     (all labs ordered are listed, but only abnormal results are displayed) Labs Reviewed - No data to display  EKG: None  Radiology: CT Knee Right Wo Contrast Result Date: 01/29/2024 CLINICAL DATA:  Knee trauma EXAM: CT OF THE RIGHT KNEE WITHOUT CONTRAST TECHNIQUE: Multidetector CT imaging of the right knee was performed according to the standard protocol. Multiplanar CT image reconstructions were also generated.  RADIATION DOSE REDUCTION: This exam was performed according to the departmental dose-optimization program which includes automated exposure control,  adjustment of the mA and/or kV according to patient size and/or use of iterative reconstruction technique. COMPARISON:  01/29/2024 FINDINGS: Bones/Joint/Cartilage No acute fracture or malalignment. Mild tricompartment arthritis of the knee with mild joint space narrowing and slight spurring. No sizable knee effusion. Ligaments Suboptimally assessed by CT. Muscles and Tendons No intramuscular fluid collections. Soft tissues Prominent soft tissue swelling anterior to the patella. Moderate circumferential edema extending from the distal thigh to the imaged lower leg. IMPRESSION: 1. No acute fracture or malalignment. 2. Prominent soft tissue swelling anterior to the patella. Moderate circumferential edema extending from the distal thigh to the imaged lower leg. 3. Mild tricompartment arthritis of the knee. Electronically Signed   By: Luke Bun M.D.   On: 01/29/2024 21:39   CT Cervical Spine Wo Contrast Result Date: 01/29/2024 CLINICAL DATA:  Trauma fall EXAM: CT CERVICAL SPINE WITHOUT CONTRAST TECHNIQUE: Multidetector CT imaging of the cervical spine was performed without intravenous contrast. Multiplanar CT image reconstructions were also generated. RADIATION DOSE REDUCTION: This exam was performed according to the departmental dose-optimization program which includes automated exposure control, adjustment of the mA and/or kV according to patient size and/or use of iterative reconstruction technique. COMPARISON:  CT 01/12/2024 FINDINGS: Alignment: Mild reversal of cervical lordosis. Facet alignment is within normal limits. Skull base and vertebrae: No acute fracture. No primary bone lesion or focal pathologic process. Soft tissues and spinal canal: No prevertebral fluid or swelling. No visible canal hematoma. Disc levels: Multilevel degenerative change. Mild to moderate diffuse disc space narrowing C4 through C7. Facet degenerative changes at multiple levels with foraminal narrowing. Upper chest: Negative.  Other: None IMPRESSION: Mild reversal of cervical lordosis with degenerative changes. No acute osseous abnormality. Electronically Signed   By: Luke Bun M.D.   On: 01/29/2024 21:23   CT Head Wo Contrast Result Date: 01/29/2024 CLINICAL DATA:  Head trauma fall EXAM: CT HEAD WITHOUT CONTRAST TECHNIQUE: Contiguous axial images were obtained from the base of the skull through the vertex without intravenous contrast. RADIATION DOSE REDUCTION: This exam was performed according to the departmental dose-optimization program which includes automated exposure control, adjustment of the mA and/or kV according to patient size and/or use of iterative reconstruction technique. COMPARISON:  CT brain 01/21/2024, 12/18/2023 FINDINGS: Brain: No acute territorial infarction, hemorrhage or intracranial mass. Mild atrophy. Mild patchy white matter hypodensity. Nonenlarged ventricles Vascular: No hyperdense vessels.  Carotid vascular calcification Skull: Normal. Negative for fracture or focal lesion. Sinuses/Orbits: No acute finding. Other: None IMPRESSION: 1. No CT evidence for acute intracranial abnormality. 2. Atrophy and mild chronic small vessel ischemic changes of the white matter. Electronically Signed   By: Luke Bun M.D.   On: 01/29/2024 21:02   DG Elbow Complete Right Result Date: 01/29/2024 CLINICAL DATA:  Fall, right elbow pain EXAM: RIGHT ELBOW - COMPLETE 3+ VIEW COMPARISON:  None Available. FINDINGS: There is no evidence of fracture, dislocation, or joint effusion. There is no evidence of arthropathy or other focal bone abnormality. Soft tissues are unremarkable. IMPRESSION: Negative. Electronically Signed   By: Dorethia Molt M.D.   On: 01/29/2024 20:23   DG Wrist Complete Right Result Date: 01/29/2024 CLINICAL DATA:  Fall, pain EXAM: RIGHT WRIST - COMPLETE 3+ VIEW COMPARISON:  None Available. FINDINGS: There is no evidence of fracture or dislocation. There is no evidence of arthropathy or other focal  bone abnormality. There is soft tissue  swelling dorsal to the radiocarpal articulation. IMPRESSION: 1. Soft tissue swelling. No fracture or dislocation. Electronically Signed   By: Dorethia Molt M.D.   On: 01/29/2024 20:22   DG Knee Complete 4 Views Right Result Date: 01/29/2024 CLINICAL DATA:  Right knee pain after fall today. EXAM: RIGHT KNEE - COMPLETE 4+ VIEW COMPARISON:  January 12, 2024. FINDINGS: No evidence of fracture, dislocation, or joint effusion. Mild narrowing of medial joint space is noted. Moderate prepatellar soft tissue swelling is noted suggesting traumatic injury. IMPRESSION: Moderate prepatellar soft tissue swelling is noted suggesting traumatic injury. No fracture or dislocation is noted. Electronically Signed   By: Lynwood Landy Raddle M.D.   On: 01/29/2024 16:43     Procedures   Medications Ordered in the ED  oxyCODONE  (OXYCONTIN ) 12 hr tablet 20 mg (has no administration in time range)  HYDROmorphone  (DILAUDID ) injection 1 mg (1 mg Intramuscular Given 01/29/24 2000)  ondansetron  (ZOFRAN -ODT) disintegrating tablet 4 mg (4 mg Oral Given 01/29/24 1959)    Clinical Course as of 01/29/24 2211  Fri Jan 29, 2024  2210 DG Knee Complete 4 Views Right [CR]    Clinical Course User Index [CR] Daralene Lonni BIRCH, PA-C                                 Medical Decision Making Patient is doing well at this time and is stable for discharge home.  Discussed with patient that all workup in the emergency department has been unremarkable.  CT scan of the head demonstrated no signs of acute intracranial hemorrhage and she had no vertebral fracture noted on CT scan of the cervical spine.  CT scan of the right knee demonstrated swelling along the anterior aspect of the patella with no signs of acute fracture.  Do not suspect underlying etiology such as cellulitis, abscess summation or septic joint at this point.  There is no overlying erythema or increased warmth.  She has no lymphatic  streaking.  She does have intact passive and active range of motion.  Do not suspect that blood work or arthrocentesis is warranted at this time.  Patient does note that she has follow-up with orthopedics in 5 days regarding her knee.  Patient had no acute findings on x-ray of the right wrist or elbow.  She had no tenderness noted over her chest wall or abdomen.  She was nontender to palpation over thoracic or lumbar spine.  She had no other long bone or joint pain noted on exam.  Do not suspect any further emergent workup is warranted in emergency department or admission at this time.  Strict return precautions were provided for any new or worsening symptoms.  Patient and husband voiced understanding and had no additional questions.  Amount and/or Complexity of Data Reviewed Radiology: ordered.  Risk Prescription drug management.        Final diagnoses:  Contusion of right knee, initial encounter  Contusion of right elbow, initial encounter  Closed head injury, initial encounter    ED Discharge Orders     None          Daralene Lonni BIRCH, PA-C 01/29/24 2210    Charlyn Sora, MD 01/30/24 1006

## 2024-01-29 NOTE — Telephone Encounter (Signed)
 FYI Only or Action Required?: FYI only for provider.  Patient was last seen in primary care on 01/28/2024 by Amber Annabella HERO, FNP.  Called Nurse Triage reporting No chief complaint on file..  Symptoms began several days ago.  Interventions attempted: Rest, hydration, or home remedies.  Symptoms are: gradually worsening.  Amber Stephenson, Pt Triage Disposition: See Physician Within 24 Hours  Patient/caregiver understands and will follow disposition?: Yes Copied from CRM #[id number]. Topic: Clinical - Red Word Triage >> Jan 29, 2024  2:09 PM Amber Stephenson wrote: Red Word that prompted transfer to Nurse Triage: possible UTI,  burning and pain, frequent urination Reason for Disposition  Urinating more frequently than usual (i.e., frequency) OR new-onset of the feeling of an urgent need to urinate (i.e., urgency)  Answer Assessment - Initial Assessment Questions 1. SYMPTOM: What's the main symptom you're concerned about? (e.g., frequency, incontinence)     Frequency, Back pain  2. ONSET: When did the  symptoms  start?      Yesterday morning  3. PAIN: Is there any pain? If Yes, ask: How bad is it? (Scale: 1-10; mild, moderate, severe)     Mild to Moderate  4. CAUSE: What do you think is causing the symptoms?     Unsure  5. OTHER SYMPTOMS: Do you have any other symptoms? (e.g., blood in urine, fever, flank pain, pain with urination)     Pain with urination  6. PREGNANCY: Is there any chance you are pregnant? When was your last menstrual period?     No and No  Spoke with Amber Stephenson, PT along with patient for triage.  Protocols used: Urinary Symptoms-A-AH

## 2024-01-29 NOTE — Telephone Encounter (Signed)
 *  STAT* If patient is at the pharmacy, call can be transferred to refill team.   1. Which medications need to be refilled? (please list name of each medication and dose if known)   potassium chloride  (KLOR-CON ) 10 MEQ tablet      2. Would you like to learn more about the convenience, safety, & potential cost savings by using the [facility]? No      3. Are you open to using the [facility] (Type [facility]. ).no    4. Which pharmacy/location (including street and city if local pharmacy) is medication to be sent to? [facility] [phone] - , [place] - [address] AT [facility] [address]    5. Do they need a 30 day or 90 day supply? 90 days   Patient is almost out of meds

## 2024-01-29 NOTE — ED Triage Notes (Signed)
 Pt tripped and fell today , hit her head on door frame at right temple area. Right knee pain , bruise from a previous fall.

## 2024-01-29 NOTE — Telephone Encounter (Signed)
 Pt's medication was sent to pt's pharmacy as requested. Confirmation received.

## 2024-01-29 NOTE — Telephone Encounter (Signed)
 APPT MADE AT ANOTHER OFFICE TODAY

## 2024-01-29 NOTE — Discharge Instructions (Addendum)
 Please continue to take your home pain medication as directed.  Follow-up closely with orthopedics on an outpatient basis.  Please continue to utilize the knee immobilizer to help with walking.  Return to emergency department immediately for any new or worsening symptoms.

## 2024-01-30 NOTE — Anesthesia Postprocedure Evaluation (Signed)
 Anesthesia Post Note  Patient: Amber Stephenson  Procedure(s) Performed: EGD (ESOPHAGOGASTRODUODENOSCOPY) COLONOSCOPY  Patient location during evaluation: Phase II Anesthesia Type: MAC Level of consciousness: awake Pain management: pain level controlled Vital Signs Assessment: post-procedure vital signs reviewed and stable Respiratory status: spontaneous breathing and respiratory function stable Cardiovascular status: blood pressure returned to baseline and stable Postop Assessment: no headache and no apparent nausea or vomiting Anesthetic complications: no Comments: Late entry   No notable events documented.   Last Vitals:  Vitals:   01/22/24 1922 01/23/24 0300  BP: 117/65 123/68  Pulse: 64 96  Resp:  18  Temp: 36.8 C 37.6 C  SpO2: 96% 96%    Last Pain:  Vitals:   01/25/24 1627  TempSrc:   PainSc: 0-No pain                 Yvonna JINNY Bosworth

## 2024-02-02 ENCOUNTER — Ambulatory Visit: Payer: Self-pay

## 2024-02-02 ENCOUNTER — Other Ambulatory Visit

## 2024-02-02 DIAGNOSIS — Z7985 Long-term (current) use of injectable non-insulin antidiabetic drugs: Secondary | ICD-10-CM | POA: Diagnosis not present

## 2024-02-02 DIAGNOSIS — D62 Acute posthemorrhagic anemia: Secondary | ICD-10-CM | POA: Diagnosis not present

## 2024-02-02 DIAGNOSIS — L89321 Pressure ulcer of left buttock, stage 1: Secondary | ICD-10-CM | POA: Diagnosis not present

## 2024-02-02 DIAGNOSIS — Z7901 Long term (current) use of anticoagulants: Secondary | ICD-10-CM | POA: Diagnosis not present

## 2024-02-02 DIAGNOSIS — Z794 Long term (current) use of insulin: Secondary | ICD-10-CM | POA: Diagnosis not present

## 2024-02-02 DIAGNOSIS — R296 Repeated falls: Secondary | ICD-10-CM | POA: Diagnosis not present

## 2024-02-02 DIAGNOSIS — Z7984 Long term (current) use of oral hypoglycemic drugs: Secondary | ICD-10-CM | POA: Diagnosis not present

## 2024-02-02 LAB — CBC WITH DIFFERENTIAL/PLATELET
Basophils Absolute: 0 x10E3/uL (ref 0.0–0.2)
Basos: 0 %
EOS (ABSOLUTE): 0.1 x10E3/uL (ref 0.0–0.4)
Eos: 3 %
Hematocrit: 26.5 % — ABNORMAL LOW (ref 34.0–46.6)
Hemoglobin: 7.9 g/dL — CL (ref 11.1–15.9)
Immature Grans (Abs): 0 x10E3/uL (ref 0.0–0.1)
Immature Granulocytes: 0 %
Lymphocytes Absolute: 0.5 x10E3/uL — ABNORMAL LOW (ref 0.7–3.1)
Lymphs: 13 %
MCH: 25.6 pg — ABNORMAL LOW (ref 26.6–33.0)
MCHC: 29.8 g/dL — ABNORMAL LOW (ref 31.5–35.7)
MCV: 86 fL (ref 79–97)
Monocytes Absolute: 0.4 x10E3/uL (ref 0.1–0.9)
Monocytes: 11 %
Neutrophils Absolute: 2.7 x10E3/uL (ref 1.4–7.0)
Neutrophils: 73 %
Platelets: 185 x10E3/uL (ref 150–450)
RBC: 3.09 x10E6/uL — ABNORMAL LOW (ref 3.77–5.28)
RDW: 17.3 % — ABNORMAL HIGH (ref 11.7–15.4)
WBC: 3.8 x10E3/uL (ref 3.4–10.8)

## 2024-02-02 NOTE — Telephone Encounter (Signed)
  FYI Only or Action Required?: Action required by provider: update on patient condition.  Patient was last seen in primary care on 01/28/2024 by Joesph Annabella HERO, FNP.  Called Nurse Triage reporting urinary symptoms.  Symptoms began several months ago.  Interventions attempted: Other: office visit, abx.  Symptoms are: gradually improving.  Triage Disposition: See PCP Within 2 Weeks  Patient/caregiver understands and will follow disposition?: Yes  Call was from [facility] nurse, unable to reach, followed up with patient.   Copied from CRM #[id number]. Topic: Clinical - Lab/Test Results >> Feb 02, 2024  1:23 PM Geneva B wrote: Reason for CRM: [facility] is calling has questions about possible uti for patient please call  [phone] Reason for Disposition  All other urine symptoms  Answer Assessment - Initial Assessment Questions 1. SYMPTOM: What's the main symptom you're concerned about? (e.g., frequency, incontinence)     Urgency, burning, cloudy urine 2. ONSET: When did the  symptoms  start?     States off and on for quite a while- three months 3. PAIN: Is there any pain? If Yes, ask: How bad is it? (Scale: 1-10; mild, moderate, severe)     denies 4. CAUSE: What do you think is causing the symptoms?     uti 5. OTHER SYMPTOMS: Do you have any other symptoms? (e.g., blood in urine, fever, flank pain, pain with urination)     denies 6. PREGNANCY: Is there any chance you are pregnant? When was your last menstrual period?     N/A   Has been being treated for UTI for three months, last  round of abx about two weeks ago  Protocols used: Urinary Symptoms-A-AH

## 2024-02-02 NOTE — Telephone Encounter (Signed)
 I called and spoke with patient regarding this issue. Offered her a video visit today but patient declined. Wants to be seen in person. Unable to come in until Friday. Scheduled her with DOD on Friday.

## 2024-02-03 ENCOUNTER — Telehealth (INDEPENDENT_AMBULATORY_CARE_PROVIDER_SITE_OTHER): Payer: Self-pay

## 2024-02-03 ENCOUNTER — Ambulatory Visit: Payer: Self-pay | Admitting: Family Medicine

## 2024-02-03 DIAGNOSIS — D62 Acute posthemorrhagic anemia: Secondary | ICD-10-CM

## 2024-02-03 NOTE — Telephone Encounter (Signed)
 Spoke to the patient today regarding her symptoms.  She reported having some occasional episodes of rectal bleeding and is frustrated about this.  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  It is possible she may have more of the lesions in the same area but need to be ablated again.  I encouraged her to follow closely in the clinic to discuss the possible need to repeat a colonoscopy.  She is in agreement with this.  Amanda,can you please schedule a follow up appointment for this patient in next available with Dr. Shaaron or any of the APPs?  Thanks,  Toribio Fortune, MD Gastroenterology and Hepatology [facility]

## 2024-02-03 NOTE — Telephone Encounter (Signed)
 Noted! Thank you

## 2024-02-03 NOTE — Telephone Encounter (Signed)
 Patient calling due to still having issues with rectal bleeding after TCS done on 01/22/2024. I made her aware of the pathology report that was mailed to her stating,  The pathology showed the samples from your stomach did not show any precancerous abnormalities or infections.  Please continue taking your prescribed medications.   Both polyps removed from the colon were sessile serrated lesions. These polyps were precancerous, but no cancer was found.  The polyps were completely removed.  Due to this, I will recommend a repeat colonoscopy in 5 years.  Patient states understanding, but states she is still having issues with seeing bright red blood when she wipes and in her stools, though it is not every time she has a bm that she sees it.   I advised I did not see a mention of any internal or external hemorrhoids. Patient denies any fevers, abdominal pain or dark stools. She would like to know why she is still having these issues. Please advise.    Thanks.

## 2024-02-03 NOTE — Telephone Encounter (Signed)
 Will need a repeat CBC in 1 week. Can check iron panel then. Future orders were placed.

## 2024-02-04 ENCOUNTER — Ambulatory Visit (INDEPENDENT_AMBULATORY_CARE_PROVIDER_SITE_OTHER): Admitting: Orthopedic Surgery

## 2024-02-04 ENCOUNTER — Encounter: Payer: Self-pay | Admitting: Orthopedic Surgery

## 2024-02-04 ENCOUNTER — Telehealth: Payer: Self-pay | Admitting: Family Medicine

## 2024-02-04 DIAGNOSIS — Z600 Problems of adjustment to life-cycle transitions: Secondary | ICD-10-CM | POA: Insufficient documentation

## 2024-02-04 DIAGNOSIS — R269 Unspecified abnormalities of gait and mobility: Secondary | ICD-10-CM

## 2024-02-04 DIAGNOSIS — M8580 Other specified disorders of bone density and structure, unspecified site: Secondary | ICD-10-CM | POA: Insufficient documentation

## 2024-02-04 DIAGNOSIS — N644 Mastodynia: Secondary | ICD-10-CM | POA: Insufficient documentation

## 2024-02-04 DIAGNOSIS — M1711 Unilateral primary osteoarthritis, right knee: Secondary | ICD-10-CM

## 2024-02-04 DIAGNOSIS — S8001XA Contusion of right knee, initial encounter: Secondary | ICD-10-CM

## 2024-02-04 DIAGNOSIS — M79673 Pain in unspecified foot: Secondary | ICD-10-CM | POA: Insufficient documentation

## 2024-02-04 DIAGNOSIS — H18419 Arcus senilis, unspecified eye: Secondary | ICD-10-CM | POA: Insufficient documentation

## 2024-02-04 DIAGNOSIS — D239 Other benign neoplasm of skin, unspecified: Secondary | ICD-10-CM | POA: Insufficient documentation

## 2024-02-04 DIAGNOSIS — F3176 Bipolar disorder, in full remission, most recent episode depressed: Secondary | ICD-10-CM | POA: Insufficient documentation

## 2024-02-04 DIAGNOSIS — E781 Pure hyperglyceridemia: Secondary | ICD-10-CM | POA: Insufficient documentation

## 2024-02-04 MED ORDER — 3-IN-1 BEDSIDE TOILET MISC: 1 refills | Status: DC

## 2024-02-04 NOTE — Progress Notes (Signed)
  Intake history:  Chief Complaint  Patient presents with   Knee Pain    Right/ fell     BP (!) 151/92   Pulse 98   Ht 5' 2 (1.575 m)   Wt 181 lb (82.1 kg)   BMI 33.11 kg/m  Body mass index is 33.32 kg/m.   78 year old female on Eliquis  has had several falls most recent 1 on 01/29/2024 prior to that she had a fall which caused a lot of swelling and pain in her right knee which has persisted  The knee is very tender from the thigh approximately mid thigh down to the foot.  She has a large area of hematoma over the anterior portion of the knee and an old hematoma on the anterolateral portion of the knee.  X-rays were negative for acute fracture  Contributing medical problems heart disease diabetes hypertension  Patient is not a smoker no kidney disease has listed allergies below  Any ALLERGIES _______ Allergies  Allergen Reactions   Iodinated Contrast Media Anaphylaxis, Swelling and Other (See Comments)    Throat closes, swelling of throat and tongue   Iodine Anaphylaxis   Latex Other (See Comments)    Latex tape pulls skin with it   Tape Other (See Comments)    Pulls off the skin, if latex   Tizanidine  Other (See Comments)    Weakness, goofy, bad dreams   _______________________________________ She sees a chronic pain management for oxycodone  for chronic pain  She is wondering what we can do for her pain today  Examination of the right knee and thigh and leg.  The knee is stable she does have a bruise over the front of it there is a small hematoma over the skin overlying the patella she has an old hematoma in the anterior lateral portion of the knee.  Compartments are soft.  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  Recommend physical  therapy to include balance training strengthening and range of motion exercises right knee.  When asked to walk the patient almost fell walking less than 5 feet  She seems to be off balance  She has had history of vertigo and what sounds like anemia secondary to bleeding.  Status post recent colonoscopy.  Return back to Eliquis .  Follow-up in 6 weeks if no improvement then we can start the process of injecting the knee with cortisone and then gel injection if needed

## 2024-02-04 NOTE — Patient Instructions (Signed)
 Physical therapy has been ordered for you at [facility]. They should call you to schedule, [phone] is the phone number to call, if you want to call to schedule.

## 2024-02-04 NOTE — Telephone Encounter (Signed)
 Provider will not be here on Friday, so need to r/s appt to Monday w/DOD ([facility]).

## 2024-02-04 NOTE — Progress Notes (Signed)
 Hi Diane,  That is fine with me to change it - OK to change to [id number]  Thanks

## 2024-02-05 ENCOUNTER — Ambulatory Visit

## 2024-02-05 ENCOUNTER — Ambulatory Visit: Payer: Self-pay

## 2024-02-05 DIAGNOSIS — Z7901 Long term (current) use of anticoagulants: Secondary | ICD-10-CM | POA: Diagnosis not present

## 2024-02-05 DIAGNOSIS — R296 Repeated falls: Secondary | ICD-10-CM | POA: Diagnosis not present

## 2024-02-05 DIAGNOSIS — Z7984 Long term (current) use of oral hypoglycemic drugs: Secondary | ICD-10-CM | POA: Diagnosis not present

## 2024-02-05 DIAGNOSIS — Z79899 Other long term (current) drug therapy: Secondary | ICD-10-CM | POA: Diagnosis not present

## 2024-02-05 DIAGNOSIS — Z7985 Long-term (current) use of injectable non-insulin antidiabetic drugs: Secondary | ICD-10-CM | POA: Diagnosis not present

## 2024-02-05 DIAGNOSIS — Z9181 History of falling: Secondary | ICD-10-CM | POA: Diagnosis not present

## 2024-02-05 DIAGNOSIS — L89321 Pressure ulcer of left buttock, stage 1: Secondary | ICD-10-CM | POA: Diagnosis not present

## 2024-02-05 DIAGNOSIS — R197 Diarrhea, unspecified: Secondary | ICD-10-CM | POA: Diagnosis not present

## 2024-02-05 DIAGNOSIS — E871 Hypo-osmolality and hyponatremia: Secondary | ICD-10-CM | POA: Diagnosis not present

## 2024-02-05 DIAGNOSIS — Z794 Long term (current) use of insulin: Secondary | ICD-10-CM | POA: Diagnosis not present

## 2024-02-05 NOTE — Telephone Encounter (Signed)
 Appt made with [facility] 9/9 at 2:25. Lab appt changed from 9:15am to 1:45pm 9/9.

## 2024-02-05 NOTE — Telephone Encounter (Signed)
 FYI Only or Action Required?: Action required by provider: request for appointment.  Patient was last seen in primary care on 01/28/2024 by Amber Annabella HERO, FNP.  Called Nurse Triage reporting Leg Pain.  Symptoms began a week ago.  Interventions attempted: Rest, hydration, or home remedies.  Symptoms are: unchanged.Pt. Asking to be worked in Tuesday.   Triage Disposition: See PCP When Office is Open (Within 3 Days)  Patient/caregiver understands and will follow disposition?: Yes   Copied from CRM [id number]. Topic: Clinical - Red Word Triage >> Feb 05, 2024 11:40 AM Avram MATSU wrote: Red Word that prompted transfer to Nurse Triage: pain right leg   ----------------------------------------------------------------------- From previous Reason for Contact - Scheduling: Patient/patient representative is calling to schedule an appointment. Refer to attachments for appointment information. Reason for Disposition  [1] MODERATE pain (e.g., interferes with normal activities, limping) AND [2] present > 3 days  Answer Assessment - Initial Assessment Questions 1. ONSET: When did the pain start?      01/29/24 2. LOCATION: Where is the pain located?      Right knee and foot 3. PAIN: How bad is the pain?    (Scale 1-10; or mild, moderate, severe)     9 4. WORK OR EXERCISE: Has there been any recent work or exercise that involved this part of the body?      fall 5. CAUSE: What do you think is causing the leg pain?     fall 6. OTHER SYMPTOMS: Do you have any other symptoms? (e.g., chest pain, back pain, breathing difficulty, swelling, rash, fever, numbness, weakness)     no 7. PREGNANCY: Is there any chance you are pregnant? When was your last menstrual period?     no  Protocols used: Leg Pain-A-AH

## 2024-02-08 ENCOUNTER — Encounter (INDEPENDENT_AMBULATORY_CARE_PROVIDER_SITE_OTHER): Payer: Self-pay | Admitting: *Deleted

## 2024-02-08 ENCOUNTER — Ambulatory Visit: Payer: Self-pay

## 2024-02-08 ENCOUNTER — Telehealth: Payer: Self-pay | Admitting: Orthopedic Surgery

## 2024-02-08 DIAGNOSIS — R296 Repeated falls: Secondary | ICD-10-CM | POA: Diagnosis not present

## 2024-02-08 DIAGNOSIS — R197 Diarrhea, unspecified: Secondary | ICD-10-CM | POA: Diagnosis not present

## 2024-02-08 DIAGNOSIS — Z794 Long term (current) use of insulin: Secondary | ICD-10-CM | POA: Diagnosis not present

## 2024-02-08 DIAGNOSIS — E871 Hypo-osmolality and hyponatremia: Secondary | ICD-10-CM | POA: Diagnosis not present

## 2024-02-08 DIAGNOSIS — L89321 Pressure ulcer of left buttock, stage 1: Secondary | ICD-10-CM | POA: Diagnosis not present

## 2024-02-08 DIAGNOSIS — Z7901 Long term (current) use of anticoagulants: Secondary | ICD-10-CM | POA: Diagnosis not present

## 2024-02-08 MED ORDER — 3-IN-1 BEDSIDE TOILET MISC: 1 refills | Status: AC

## 2024-02-08 NOTE — Telephone Encounter (Signed)
 FYI Only or Action Required?: Action required by provider: update on patient condition.  Patient was last seen in primary care on 01/28/2024 by Joesph Annabella HERO, FNP.  Called Nurse Triage reporting PCP Call.  Symptoms began today.  Interventions attempted: Nothing.  Symptoms are: N/A.  Triage Disposition: Call PCP Within 24 Hours  Patient/caregiver understands and will follow disposition?: Yes  **See note below**        Copied from CRM #[id number]. Topic: Clinical - Red Word Triage >> Feb 08, 2024  4:05 PM Avram MATSU wrote: Red Word that prompted transfer to Nurse Triage: shortness of breath,weight loss, have not been taking her fluid pill.   ----------------------------------------------------------------------- From previous Reason for Contact - Other: Reason for CRM: con stated Reason for Disposition  [1] Caller requests to speak ONLY to PCP AND [2] NON-URGENT question  Answer Assessment - Initial Assessment Questions 1. REASON FOR CALL or QUESTION: What is your reason for calling today? or How can I best   Patient was seen by Physical therapy today. Bailey from the office called  after seeing patient and reports patient weight of 181, and to inform the PCP  patient stopped taking her furosemide  x 2 weeks.  Protocols used: PCP Call - No Triage-A-AH

## 2024-02-08 NOTE — Telephone Encounter (Signed)
 Appt made.

## 2024-02-08 NOTE — Telephone Encounter (Signed)
 Dr. Areatha pt - pt lvm stating she had requested a portable commode for her bedroom to be sent to [facility] and they said they never got a script for it.  She wants to talk w/Amy - [phone]

## 2024-02-08 NOTE — Telephone Encounter (Signed)
 I called to advise her order resent.

## 2024-02-08 NOTE — Telephone Encounter (Signed)
 Resent it , I will call her

## 2024-02-08 NOTE — Progress Notes (Signed)
 5 yr TCS noted in recall Patient result letter mailed

## 2024-02-09 ENCOUNTER — Other Ambulatory Visit

## 2024-02-09 ENCOUNTER — Encounter: Payer: Self-pay | Admitting: Gastroenterology

## 2024-02-09 ENCOUNTER — Ambulatory Visit (INDEPENDENT_AMBULATORY_CARE_PROVIDER_SITE_OTHER): Admitting: Family Medicine

## 2024-02-09 ENCOUNTER — Telehealth: Payer: Self-pay | Admitting: *Deleted

## 2024-02-09 ENCOUNTER — Encounter: Payer: Self-pay | Admitting: Family Medicine

## 2024-02-09 ENCOUNTER — Ambulatory Visit (INDEPENDENT_AMBULATORY_CARE_PROVIDER_SITE_OTHER): Admitting: Gastroenterology

## 2024-02-09 VITALS — BP 110/74 | HR 75 | Temp 98.0°F | Ht 62.0 in | Wt 186.8 lb

## 2024-02-09 VITALS — BP 149/92 | HR 96 | Temp 97.5°F | Ht 62.0 in | Wt 186.8 lb

## 2024-02-09 DIAGNOSIS — K253 Acute gastric ulcer without hemorrhage or perforation: Secondary | ICD-10-CM

## 2024-02-09 DIAGNOSIS — K5909 Other constipation: Secondary | ICD-10-CM

## 2024-02-09 DIAGNOSIS — K921 Melena: Secondary | ICD-10-CM

## 2024-02-09 DIAGNOSIS — K219 Gastro-esophageal reflux disease without esophagitis: Secondary | ICD-10-CM | POA: Diagnosis not present

## 2024-02-09 DIAGNOSIS — D62 Acute posthemorrhagic anemia: Secondary | ICD-10-CM | POA: Diagnosis not present

## 2024-02-09 DIAGNOSIS — K259 Gastric ulcer, unspecified as acute or chronic, without hemorrhage or perforation: Secondary | ICD-10-CM | POA: Diagnosis not present

## 2024-02-09 DIAGNOSIS — K625 Hemorrhage of anus and rectum: Secondary | ICD-10-CM

## 2024-02-09 DIAGNOSIS — M25561 Pain in right knee: Secondary | ICD-10-CM | POA: Diagnosis not present

## 2024-02-09 DIAGNOSIS — R140 Abdominal distension (gaseous): Secondary | ICD-10-CM

## 2024-02-09 DIAGNOSIS — D649 Anemia, unspecified: Secondary | ICD-10-CM | POA: Diagnosis not present

## 2024-02-09 DIAGNOSIS — R601 Generalized edema: Secondary | ICD-10-CM

## 2024-02-09 DIAGNOSIS — Z860100 Personal history of colon polyps, unspecified: Secondary | ICD-10-CM

## 2024-02-09 DIAGNOSIS — G2581 Restless legs syndrome: Secondary | ICD-10-CM | POA: Diagnosis not present

## 2024-02-09 DIAGNOSIS — D500 Iron deficiency anemia secondary to blood loss (chronic): Secondary | ICD-10-CM

## 2024-02-09 DIAGNOSIS — Z8719 Personal history of other diseases of the digestive system: Secondary | ICD-10-CM

## 2024-02-09 DIAGNOSIS — M25562 Pain in left knee: Secondary | ICD-10-CM

## 2024-02-09 DIAGNOSIS — R1084 Generalized abdominal pain: Secondary | ICD-10-CM

## 2024-02-09 DIAGNOSIS — N179 Acute kidney failure, unspecified: Secondary | ICD-10-CM | POA: Diagnosis not present

## 2024-02-09 MED ORDER — ROPINIROLE HCL 2 MG PO TABS: 2.0000 mg | ORAL_TABLET | Freq: Every day | ORAL | 0 refills | Status: AC

## 2024-02-09 NOTE — Progress Notes (Signed)
 GI Office Note    Referring Provider: [name], MD Primary Care Physician:  [name], MD Primary Gastroenterologist: Lamar HERO.Rourk, MD  Date:  02/09/2024  ID:  OLA RAAP, DOB 04-11-1946, MRN [id number]   Chief Complaint   Chief Complaint  Patient presents with   Follow-up    States that she is here to discuss her last colonoscopy and is very bloated. Pt states that she is no longer having any rectal bleeding.   History of Present Illness  Amber Stephenson is a 78 y.o. female with a history of endometrial cancer medically inoperable, s/p IMRT, Afib on Eliquis , bipolar 1 disorder, CAD, CHF, chronic pain, DM, HTN, Herpes, NAFLD, OSA, osteopenia, RLS, pulmonary hypertension and previous history of microscopic colitis  presenting today with complaint of bloating.   Colonoscopy 2018: - Hemorrhoids - 6 mm cecal adenoma - Sigmoid diverticulosis - Biopsies positive for microscopic colitis  Hospitalization in May 2025 with rectal bleeding/melena for some time, unable to say how long.  Had been seeing [facility] and was in the process of obtaining cardiac clearance for bidirectional endoscopic evaluation given she had been on Eliquis .  Endorsed diffuse abdominal pain with nausea but no vomiting.  Reports weight fluctuations.  Denied alcohol  or NSAID use.  Also GERD symptoms but no dysphagia.  She was hyponatremic with sodium 131 normal hemoglobin at 12.1.  CT during this hospitalization with minimal perisigmoid haziness with chronic versus mild acute diverticulitis and no abscess or perforation.  She was having improvement in her abdominal pain with IV antibiotics and did not have any leukocytosis and given her hemoglobin was stable this bleeding was felt to be secondary to diverticulitis therefore the decision was made to hold off on inpatient endoscopies advised to follow-up with her primary physician with [facility].  Last office visit 12/07/23 with Dr. Shaaron.  Seen after prior  hospitalization.  Reports diarrhea had calm down she was not having any nausea vomiting or dysphagia and her abdominal pain had been improved.  She had recently noted that her blood sugars have been in the 300 range as well.  She stated she had previously supposed to have been on home O2 but insurance would not pay for it.  He recommended seeing cardiology and plan for outpatient EGD and colonoscopy. Obtain labs (CBC and CMP)  Represented to the hospital 01/19/24 with rectal bleeding and worsening anemia.  hemoglobin 7.6, sodium 123, stool heme positive.  C. difficile was negative.  CT A/P with contrast with air-fluid levels within the colon, similar infiltration of the subcutaneous soft tissue at the ventral abdominal pannus, cellulitis not excluded, chronic interstitial changes of the visualized lung bases with similar patchy ground glass nodular densities, concerning for multifocal infection better evaluated on recent chest CT.  She had reported acute onset diarrhea prior to presenting to the hospital and was unable to quantify the amount of bowel movements.  Had recently completed 7-day day course of antibiotics for UTI.  Also reported ongoing issues with early satiety.  Once her sodium improved she was scheduled for colonoscopy and upper endoscopy which was performed which was performed as noted below.  After procedure she was no longer having diarrhea, abdominal pain, nausea, or vomiting and was tolerating diet.  Also no more episodes of melena or hematochezia.  She was encouraged to avoid any NSAIDs and continue with PPI twice daily and consider repeat EGD in 3 months.  EGD 01/22/24: - Normal esophagus.  - Non-bleeding gastric ulcers  with a clean ulcer base (Forrest Class III). Biopsied.  - Non-bleeding duodenal diverticulum.  - Use pantoprazole  40 mg twice daily - Avoid NSAIDs - Path negative for H. pylori.  Superficial ulceration and surface erosion present.  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  - The distal rectum and anal verge are normal on retroflexion view.  - Repeat colonoscopy in 5 years   Today:  Discussed the use of AI scribe software for clinical note transcription with the patient, who gave verbal consent to proceed.  She has been experiencing abdominal distention for the past two weeks, with asymmetry noted as one side appears larger than the other. The distention does not improve significantly after bowel movements and is not influenced by food intake. Her stretch marks have enlarged due to the distention. Pain/cramping occurs with bowel movements but does not persist afterward.  She experiences chronic constipation, with daily bowel movements that require significant straining. Her stools are type 4 on the [facility] Stool Chart, but she finds them difficult to pass, spending about five to seven minutes on the commode. She uses Senokot, taking four pills at a time, once or twice a week, to aid bowel movements. No MiraLAX  currently.   She has a history of low hemoglobin, with recent levels around 7.9 g/dL. She denies any current bleeding but has a history of rectal bleeding attributed to angiodysplastic lesions and hemorrhoids. She is undergoing blood work to monitor this condition.  She is currently taking pantoprazole  twice daily for stomach ulcers identified during an upper endoscopy. No nausea, vomiting, or dysphagia, and her reflux is well-controlled with pantoprazole  40 mg twice daily.   She reports fluid retention, particularly in her abdomen and legs, which she describes as tender and swollen. She has not been taking diuretics recently due to difficulty obtaining them. Her skin is sensitive and experiences a pins and needles sensation, especially in her legs and feet. Having some shortnes of  breath and insomnia last night.   Has follow up with PCP today and scheduled for lab work - CBC and iron studies.      Wt Readings from Last 5 Encounters:  02/09/24 186 lb 12.8 oz (84.7 kg)  02/04/24 181 lb (82.1 kg)  01/26/24 181 lb 9.6 oz (82.4 kg)  01/19/24 179 lb 7.3 oz (81.4 kg)  01/14/24 (P) 179 lb 8 oz (81.4 kg)    Current Outpatient Medications  Medication Sig Dispense Refill   acetaminophen  (TYLENOL ) 325 MG tablet Take 2 tablets (650 mg total) by mouth every 6 (six) hours as needed for mild pain (pain score 1-3) or fever (or Fever >/= 101).     ALPRAZolam  (XANAX ) 1 MG tablet Take 1 mg by mouth at bedtime as needed for anxiety or sleep.     ARIPiprazole  (ABILIFY ) 2 MG tablet Take 1 tablet (2 mg total) by mouth at bedtime. 30 tablet 5   atorvastatin  (LIPITOR) 80 MG tablet Take 1 tablet (80 mg total) by mouth daily. 90 tablet 3   carbidopa -levodopa  (SINEMET ) 10-100 MG tablet Take 1 tablet by mouth 4 (four) times daily. For restless leg syndrome 120 tablet 5   colchicine  0.6 MG tablet Take 1 tablet (0.6 mg total) by mouth 2 (two) times daily. For mouth sores 60 tablet 1   Continuous Glucose Sensor (DEXCOM G7 SENSOR) MISC CHANGE SENSOR EVERY  10 DAYS 9 each 3   cyanocobalamin  (VITAMIN B12) 1000 MCG tablet Take 1 tablet (1,000 mcg total) by mouth daily. 30 tablet 1   denosumab  (PROLIA ) 60 MG/ML SOSY injection Inject 60 mg into the skin every 6 (six) months. 1 mL 1   diclofenac  (VOLTAREN ) 75 MG EC tablet Take 75 mg by mouth 2 (two) times daily.     diphenoxylate -atropine  (LOMOTIL ) 2.5-0.025 MG tablet TAKE 2 TABLETS BY MOUTH FOUR TIMES DAILY AS NEEDED FOR DIARRHEA OR LOOSE STOOLS 30 tablet 5   ELIQUIS  5 MG TABS tablet TAKE 1 TABLET TWICE A DAY 180 tablet 3   escitalopram  (LEXAPRO ) 10 MG tablet Take 10 mg by mouth daily.     furosemide  (LASIX ) 40 MG tablet Take 1 tablet (40 mg total) by mouth daily. 30 tablet 3   gabapentin  (NEURONTIN ) 100 MG capsule Take 1 capsule (100 mg total) by mouth  3 (three) times daily. 90 capsule 3   hydrOXYzine  (VISTARIL ) 25 MG capsule Take 1 capsule (25 mg total) by mouth every 8 (eight) hours as needed for anxiety. 30 capsule 5   insulin  glargine (LANTUS  SOLOSTAR) 100 UNIT/ML Solostar Pen Inject 10 Units into the skin at bedtime. 9 mL 0   insulin  lispro (HUMALOG  KWIKPEN) 100 UNIT/ML KwikPen Inject 0-10 Units into the skin 2 (two) times daily before a meal. Sliding scale Short acting Humalog  insulin  per sliding scale 0-10 ---:-- Insulin  injection 0-10 Units 0-10 Units Subcutaneous, 3 times daily with meals CBG 70 - 120: 0 unit CBG 121 - 150: 0 unit  CBG 151 - 200: 1 unit CBG 201 - 250: 2 units CBG 251 - 300: 4 units CBG 301 - 350: 6 units  CBG 351 - 400: 8 units  CBG > 400: 10 units 18 mL 1   Insulin  Pen Needle 29G X MISC 1 Device by Does not apply route daily in the afternoon. 400 each 3   losartan  (COZAAR ) 25 MG tablet Take 25 mg by mouth daily.     magic mouthwash (nystatin , lidocaine , diphenhydrAMINE ) suspension Take 5 mLs by mouth 3 (three) times daily as needed for mouth pain. 180 mL 0   MAGNESIUM  PO Take 1 tablet by mouth daily.     metFORMIN  (GLUCOPHAGE -XR) 500 MG 24 hr tablet Take 2 tablets (1,000 mg total) by mouth daily with breakfast. 90 tablet 3   Methocarbamol  1000 MG TABS Take 1,000 mg by mouth every 6 (six) hours as needed. 120 tablet 5   metoprolol  tartrate (LOPRESSOR ) 50 MG tablet Take 1 tablet (50 mg total) by mouth 2 (two) times daily. 180 tablet 3   Misc. Devices (3-IN-1 BEDSIDE TOILET) MISC Length of need 99 months M17.11 Osteoarthritis right knee / multiple falls / high risk fall 1 each 1   omeprazole  (PRILOSEC) 40 MG capsule Take 40 mg by mouth 2 (two) times daily.     ondansetron  (ZOFRAN ) 4 MG tablet Take 1 tablet (4 mg total) by mouth every 8 (eight) hours as needed for nausea or vomiting. For cancer related nausea 30 tablet 1   OXcarbazepine  (TRILEPTAL ) 150 MG tablet Take 1 tablet (150 mg total) by mouth 2 (two) times daily. 60  tablet 0   oxyCODONE  (OXYCONTIN ) 20 mg 12 hr tablet Take 1 tablet (20 mg total) by mouth every 12 (twelve) hours. G89.3- chronic pain- fill 28 days after 8/25- Rx 60 tablet 0   pantoprazole  (PROTONIX ) 40 MG tablet Take 1 tablet (40 mg total) by mouth 2 (two) times  daily. 60 tablet 5   potassium chloride  (KLOR-CON ) 10 MEQ tablet Take 1 tablet (10 mEq total) by mouth daily. 90 tablet 1   rOPINIRole  (REQUIP ) 1 MG tablet Take 1 mg by mouth at bedtime.     tirzepatide  (MOUNJARO ) 12.5 MG/0.5ML Pen Inject 12.5 mg into the skin once a week. 6 mL 3   Vitamin D , Cholecalciferol , 25 MCG (1000 UT) TABS Take 1,000 Units by mouth in the morning.     Current Facility-Administered Medications  Medication Dose Route Frequency Provider Last Rate Last Admin   denosumab  (PROLIA ) injection 60 mg  60 mg Subcutaneous Q6 months [name], MD   60 mg at 01/15/24 1459   Facility-Administered Medications Ordered in Other Visits  Medication Dose Route Frequency Provider Last Rate Last Admin   bupivacaine -epinephrine  (MARCAINE  W/ EPI) 0.5% -1:200000 injection    Anesthesia Intra-op Tilford Franky BIRCH, MD   12 mL at 01/21/19 0935    Past Medical History:  Diagnosis Date   Allergic rhinitis 02/24/2019   Ambulatory dysfunction 04/23/2020   Atrial fibrillation with RVR (HCC) 05/19/2017   Back pain/sacroiliitis--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   Benign paroxysmal positional vertigo due to bilateral vestibular disorder 05/20/2019   Bipolar 1 disorder (HCC) 01/23/2015   with GAD, benzo dependence   CAD (coronary artery disease)    Nonobstructive; Managed by Dr. Charls   Cardiomegaly 01/12/2018   CHF (congestive heart failure) 06/08/2022   Chronic back pain 01/04/2015   Chronic constipation 04/25/2020   Chronic diastolic heart failure (HCC) 05/30/2015   Chronic pain syndrome 08/22/2019   back pain, sacroiliitis    Chronic post-traumatic stress disorder (PTSD) 12/06/2020   Diabetic neuropathy (HCC) 02/06/2016   Dyslipidemia 09/24/2020   Essential hypertension    Functional diarrhea 10/26/2020   Herpes genitalis in women 07/16/2015   History of adenomatous polyp of colon 05/21/2019   Overview:   03/31/17: Colonoscopy: nonadvanced adenoma, microscopic colitis, f/u 5 yrs, Murphy/GAP   History of radiation therapy    Endometrial- 02/18/23-03/31/23-Dr. Lynwood Kinard   Hypotension 09/01/2022   Insomnia 01/23/2015   Metabolic acidosis 01/12/2023   Migraine headache with aura 02/12/2016   Myofascial pain dysfunction syndrome 08/22/2019   Non-alcoholic fatty liver disease 01/12/2018   OSA (obstructive sleep apnea) 02/24/2019   10/09/2018 - HST  - AHI 40.6    Osteopenia 12/06/2020   Rx alendronate  35 mg.   Pinguecula 12/06/2020   Presbyopia 12/06/2020   Pulmonary hypertension    RLS (restless legs syndrome) 04/27/2015   RSV (respiratory syncytial virus infection) 06/10/2023   Tremor, essential 12/11/2021   Type II diabetes mellitus (HCC) 09/24/2020    Past Surgical History:  Procedure Laterality Date   BREAST REDUCTION SURGERY     COLONOSCOPY  2018   6 mm cecal tubular adenoma, sigmoid diverticulosis, random colon biopsies consistent with microscopic colitis   COLONOSCOPY N/A 01/22/2024   Procedure: COLONOSCOPY;  Surgeon: Eartha Angelia Sieving, MD;  Location: [facility];  Service: Gastroenterology;  Laterality: N/A;   ESOPHAGOGASTRODUODENOSCOPY N/A 01/22/2024   Procedure: EGD (ESOPHAGOGASTRODUODENOSCOPY);  Surgeon: Eartha Angelia, Sieving, MD;  Location: [facility];  Service: Gastroenterology;  Laterality: N/A;   EYE SURGERY Right    cateracts   HAMMER TOE SURGERY     LEFT HEART CATH AND CORONARY ANGIOGRAPHY N/A 02/03/2018   Procedure: LEFT HEART CATH AND CORONARY ANGIOGRAPHY;  Surgeon: [name], Peter M, MD;  Location: [facility];  Service: Cardiovascular;  Laterality: N/A;    REVERSE SHOULDER ARTHROPLASTY Right 08/19/2019   Procedure: REVERSE SHOULDER ARTHROPLASTY;  Surgeon: Kay Kemps, MD;  Location: [facility];  Service: Orthopedics;  Laterality: Right;  interscalene block   SHOULDER SURGERY Right    I BROKE MY SHOUDLER   THIGH SURGERY     TO REMOVE A TUMOR     Family History  Problem Relation Age of Onset   Diabetes Mother    Heart disease Mother    Alzheimer's disease Father    Heart disease Sister        CABG   Diabetes Sister    Alcohol  abuse Sister    Stroke Brother    Heart disease Brother    Mental illness Brother    Diabetes Brother    Breast cancer Neg Hx    Ovarian cancer Neg Hx    Colon cancer Neg Hx    Endometrial cancer Neg Hx     Allergies as of 02/09/2024 - Review Complete 02/09/2024  Allergen Reaction Noted   Iodinated contrast media Anaphylaxis, Swelling, and Other (See Comments) 12/13/2014   Iodine Anaphylaxis 06/28/2003   Latex Other (See Comments) 09/29/2018   Tape Other (See Comments) 01/28/2023   Tizanidine  Other (See Comments) 03/10/2022    Social History   Socioeconomic History   Marital status: Married    Spouse name: Financial planner   Number of children: 3   Years of education: 15   Highest education level: Associate degree: academic program  Occupational History   Occupation: Retired    Comment: Chief Financial Officer  Tobacco Use   Smoking status: Never   Smokeless tobacco: Never  Vaping Use   Vaping status: Never Used  Substance and Sexual Activity   Alcohol  use: Not Currently    Alcohol /week: 0.0 standard drinks of alcohol    Drug use: No   Sexual activity: Not Currently    Partners: Male    Birth control/protection: Post-menopausal  Other Topics Concern   Not on file  Social History Narrative   Lives at home with husband.    They have 3 children who live away - [place] , [place] , and [place] .   She is from [place]  and most of her family lives there.   Her husbands's family live nearby   Right handed.    Social Drivers of Corporate investment banker Strain: Low Risk  (08/13/2023)   Overall Financial Resource Strain (CARDIA)    Difficulty of Paying Living Expenses: Not hard at all  Food Insecurity: No Food Insecurity (01/19/2024)   Hunger Vital Sign    Worried About Running Out of Food in the Last Year: Never true    Ran Out of Food in the Last Year: Never true  Transportation Needs: No Transportation Needs (01/19/2024)   PRAPARE - Administrator, Civil Service (Medical): No    Lack of Transportation (Non-Medical): No  Physical Activity: Insufficiently Active (08/13/2023)   Exercise Vital Sign    Days of Exercise per Week: 3 days    Minutes of Exercise per Session: 20 min  Stress: No Stress Concern Present (08/13/2023)   [employer] of Occupational Health - Occupational Stress Questionnaire    Feeling of Stress : Only a little  Social Connections: Moderately Integrated (01/19/2024)   Social Connection and Isolation Panel    Frequency of Communication with Friends and Family: More than three times a week    Frequency of Social Gatherings with Friends and  Family: Once a week    Attends Religious Services: More than 4 times per year    Active Member of Clubs or Organizations: Patient unable to answer    Attends Banker Meetings: Never    Marital Status: Married     Review of Systems   Gen: Denies fever, chills, anorexia. Denies fatigue, weakness, weight loss.  CV: Denies chest pain, palpitations, syncope, peripheral edema, and claudication. Resp: Denies dyspnea at rest, cough, wheezing, coughing up blood, and pleurisy. GI: See HPI Derm: Denies rash, itching, dry skin Psych: Denies depression, anxiety, memory loss, confusion. No homicidal or suicidal ideation.  Heme: Denies bruising, bleeding, and enlarged lymph nodes.  Physical Exam   BP (!) 149/92 (BP Location: Right Arm, Patient Position: Sitting, Cuff Size: Normal)   Pulse 96   Temp (!) 97.5 F  (36.4 C) (Oral)   Ht 5' 2 (1.575 m)   Wt 186 lb 12.8 oz (84.7 kg)   SpO2 93%   BMI 34.17 kg/m   General:   Alert and oriented. No distress noted. Pleasant and cooperative.  Head:  Normocephalic and atraumatic. Eyes:  Conjuctiva clear without scleral icterus. Mouth:  Oral mucosa pink and moist. Good dentition. No lesions. Abdomen:  +BS, soft. Mild distention. Pitting edema to abdominal wall (anasarca). TTP across lower abdomen. No rebound or guarding. No HSM or masses noted. Rectal: deferred Msk:  Symmetrical without gross deformities. Normal posture. Extremities:  +2 pitting edema bilaterally. Mild swelling to Right knee.  Neurologic:  Alert and  oriented x4 Psych:  Alert and cooperative. Normal mood and affect.  Assessment & Plan  Amber Stephenson is a 78 y.o. female presenting today for hospital follow-up.      Constipation with abdominal bloating and cramping Chronic constipation with daily bowel movements but significant straining. Abdominal bloating and cramping likely due to gas production from retained stool. Differential includes celiac disease. - Start daily Miralax  17g to improve bowel regularity, decrease to 1/2 capful if having diarrhea daily.  - Use Gas-X or Beano for initial gassiness with Miralax  - Order celiac disease lab test for completeness sake given bloating  Anasarca (generalized edema) with lower extremity and abdominal involvement and pain Generalized edema with fluid retention in lower extremities and abdominal area, likely related to fluid retention in skin and fat tissue rather than abdominal cavity. Possible contribution from congestive heart failure or low sodium levels. Diuretics have been difficult to manage due to low sodium levels. Pitting edema to abdominal skin during auscultation.  Less likely hepatic ascites even though she has NAFLD.  - Follow with primary care to manage diuretics for fluid reduction - Could consider repeat imaging in near  future if needed to reassess for ascites if distention worsens.   Iron deficiency anemia Persistent low hemoglobin levels around 7.9, consistent with recent hospitalization levels. Awaiting results from today's blood work and iron studies to guide further management. Most likely secondary to chronic blood loss from colonic angiodysplastic lesions and gastric ulcers, chronic AC use, and B12 deficiency. - Continue B12 supplement - Continue PPI BID - Review today's blood work and iron studies to guide management - may need daily iron therapy  Gastric ulcers, post-treatment, under surveillance Gastric ulcers previously identified on EGD. Currently under treatment with pantoprazole  40 mg twice daily. Biopsies showed no H. pylori or malignancy. Reviewed path with patient and husband.  - Continue pantoprazole  twice daily - Schedule follow-up endoscopy in November  Gastroesophageal reflux disease (GERD), controlled GERD symptoms  well controlled with current pantoprazole  regimen. No N/V, dysphagia.   History of colonic polyp, removed Previous colonic polyp removed, classified as precancerous (sessile serrated adenoma). Follow-up colonoscopy recommended in five years, contingent on health status. Reviewed path with patient and husband.  - Schedule follow-up colonoscopy in five years - health permitting.      Follow up   EGD in November ASA 4 - request clearance Follow up 3 months    Charmaine Melia, MSN, FNP-BC, AGACNP-BC [facility]

## 2024-02-09 NOTE — Patient Instructions (Signed)
 VISIT SUMMARY:  Today, we discussed your ongoing issues with abdominal distention and constipation, as well as your history of low hemoglobin, fluid retention, and stomach ulcers. We reviewed your current medications and made some adjustments to help manage your symptoms more effectively.  YOUR PLAN:  -CONSTIPATION WITH ABDOMINAL BLOATING AND CRAMPING: You have chronic constipation, which means you have difficulty passing stools regularly. This is causing abdominal bloating and cramping. To help with this, you should start taking Miralax  daily to improve bowel regularity. You can also use Gas-X or Beano to help with any initial gassiness that may occur when you start Miralax . We will also test for celiac disease to rule out any underlying conditions.  -ANASARCA (GENERALIZED EDEMA) WITH LOWER EXTREMITY AND ABDOMINAL INVOLVEMENT: Anasarca is a condition where your body retains too much fluid, causing swelling in your legs and abdomen. This could be related to heart issues or low sodium levels or kidney dysfunction. Follow with your primary care doctor to manage diuretics, which are medications that help reduce fluid retention.  -IRON DEFICIENCY ANEMIA: Iron deficiency anemia means you have low levels of hemoglobin in your blood, which can make you feel tired and weak. We are waiting for the results of today's blood work and iron studies to guide further treatment. You may need to star iron therapy.   -GASTRIC ULCERS, POST-TREATMENT, UNDER SURVEILLANCE: You have a history of stomach ulcers, which are sores in the lining of your stomach. These are being treated, and you are currently taking pantoprazole  twice daily to help with healing. We will schedule a follow-up endoscopy in November to check on your condition.  -GASTROESOPHAGEAL REFLUX DISEASE (GERD), CONTROLLED: GERD is a condition where stomach acid frequently flows back into the tube connecting your mouth and stomach. Your symptoms are well  controlled with your current pantoprazole  regimen.  -HISTORY OF COLONIC POLYP, REMOVED: You previously had a precancerous polyp removed from your colon. A follow-up colonoscopy is recommended in five years, depending on your overall health at that time.  INSTRUCTIONS:  Please start taking Miralax  daily and use Gas-X or Beano as needed for gassiness. We will coordinate with your primary care doctor to manage your diuretics. Continue taking pantoprazole  40 mg twice daily and schedule a follow-up endoscopy in November. A follow-up colonoscopy is recommended in five years. We will review the results of today's blood work and iron studies to guide further treatment.  Follow up in the office in 3 months.   It was a pleasure to see you today. I want to create trusting relationships with patients. If you receive a survey regarding your visit,  I greatly appreciate you taking time to fill this out on paper or through your MyChart. I value your feedback.  Charmaine Melia, MSN, FNP-BC, AGACNP-BC [facility]

## 2024-02-09 NOTE — Telephone Encounter (Signed)
  Request for patient to stop medication prior to procedure or is needing cleareance  02/09/24  Amber Stephenson [birth year]  What type of surgery is being performed? Esophagogastroduodenoscopy (EGD)  When is surgery scheduled? TBD  What type of clearance is required (medical or pharmacy to hold medication or both? medication  Are there any medications that need to be held prior to surgery and how long? Eliquis  x 2 days  Name of physician performing surgery?  Dr.Rourk  [facility] at [employer]: [phone], option 5 Fax: [phone]  Anesthesia type (none, local, MAC, general)? MAC   ? Yes ? No Patient can hold medication as requested   Signature: ___________________________

## 2024-02-09 NOTE — Progress Notes (Signed)
 Subjective:  Patient ID: Amber Stephenson, female    DOB: May 17, 1946  Age: 78 y.o. MRN: [id number]  CC: Leg Pain   HPI  Discussed the use of AI scribe software for clinical note transcription with the patient, who gave verbal consent to proceed.  History of Present Illness Amber Stephenson is a 78 year old female who presents with leg pain and swelling following multiple falls.  She experienced two falls, one approximately a month ago and another two weeks ago, resulting in significant pain and swelling in her right leg, particularly affecting her knee and foot. The leg is very tender, with swelling extending from her foot to above her knee. She has difficulty moving her foot and experiences pain that limits her activity. X-rays taken at the hospital ruled out a fracture. The patient reports that she did break a rib during the fall.  She has a history of ulcers and recently underwent a colonoscopy, which revealed two precancerous polyps that were removed. She has low hemoglobin levels, which have been a concern, and experiences significant fatigue, stating she gets winded and exhausted with minimal exertion. Blood work has been conducted to further evaluate her hemoglobin levels.  She suffers from severe restless leg syndrome, which disrupts her sleep. She is currently taking ropinirole  at a dose of 1 mg once daily, which helps, but the symptoms persist. She also takes furosemide  for swelling but has reduced the dose due to frequent urination. She is on losartan  and potassium supplements to manage her blood pressure and potassium levels, respectively.  Her current medications include oxycodone  for pain management, prescribed by another physician, and she is unable to take additional pain medications due to her current regimen and medical conditions.          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), Benign paroxysmal positional vertigo due to bilateral vestibular disorder (05/20/2019), Bipolar 1 disorder (HCC) (01/23/2015), CAD (coronary artery disease), Cardiomegaly (01/12/2018), CHF (congestive heart failure) (06/08/2022), Chronic back pain (01/04/2015), Chronic constipation (04/25/2020), Chronic diastolic heart failure (HCC) (87/71/7983), Chronic pain syndrome (08/22/2019), Chronic post-traumatic stress disorder (PTSD) (12/06/2020), Diabetic neuropathy (HCC) (02/06/2016), Dyslipidemia (09/24/2020), Essential hypertension, Functional diarrhea (10/26/2020), Herpes genitalis in women (07/16/2015), History of adenomatous polyp of colon (05/21/2019), History of radiation therapy, Hypotension (09/01/2022), Insomnia (01/23/2015), Metabolic acidosis (01/12/2023), Migraine headache with aura (02/12/2016), Myofascial pain dysfunction syndrome (08/22/2019), Non-alcoholic fatty liver disease (91/86/7980), OSA (obstructive sleep apnea) (02/24/2019), Osteopenia (12/06/2020), Pinguecula (12/06/2020), Presbyopia (12/06/2020), Pulmonary hypertension, RLS (restless legs syndrome) (04/27/2015), RSV (respiratory syncytial virus infection) (06/10/2023), Tremor, essential (12/11/2021), and Type II diabetes mellitus (HCC) (09/24/2020).   She has a past surgical history that includes THIGH SURGERY; Shoulder surgery (Right); Breast reduction surgery; Eye surgery (Right); Hammer toe surgery; LEFT HEART CATH AND CORONARY ANGIOGRAPHY (N/A, 02/03/2018); Reverse shoulder arthroplasty (Right, 08/19/2019); Colonoscopy (2018); Esophagogastroduodenoscopy (N/A, 01/22/2024); and Colonoscopy (N/A, 01/22/2024).    Her family history includes Alcohol  abuse in her sister; Alzheimer's disease in her father; Diabetes in her brother, mother, and sister; Heart disease in her brother, mother,  and sister; Mental illness in her brother; Stroke in her brother.She reports that she has never smoked. She has never used smokeless tobacco. She reports that she does not currently use alcohol . She reports that she does not use drugs.    ROS Review of Systems  Constitutional: Negative.   HENT:  Negative for congestion.   Eyes:  Negative for visual disturbance.  Respiratory:  Negative for shortness of breath.   Cardiovascular:  Negative for chest pain.  Gastrointestinal:  Negative for abdominal pain, constipation, diarrhea, nausea and vomiting.  Genitourinary:  Negative for difficulty urinating.  Musculoskeletal:  Positive for arthralgias. Negative for myalgias.  Neurological:  Negative for headaches.  Psychiatric/Behavioral:  Negative for sleep disturbance.     Objective:  BP 110/74   Pulse 75   Temp 98 F (36.7 C)   Ht 5' 2 (1.575 m)   Wt 186 lb 12.8 oz (84.7 kg)   SpO2 96%   BMI 34.17 kg/m   BP Readings from Last 3 Encounters:  02/09/24 110/74  02/09/24 (!) 149/92  02/04/24 (!) 151/92    Wt Readings from Last 3 Encounters:  02/09/24 186 lb 12.8 oz (84.7 kg)  02/09/24 186 lb 12.8 oz (84.7 kg)  02/04/24 181 lb (82.1 kg)     Physical Exam   Assessment & Plan:  Acute pain of both knees  ABLA (acute blood loss anemia) -     Iron, TIBC and Ferritin Panel -     CBC with Differential/Platelet  RLS (restless legs syndrome)  Acute gastric ulcer without hemorrhage or perforation  Other orders -     rOPINIRole  HCl; Take 1 tablet (2 mg total) by mouth at bedtime.  Dispense: 90 tablet; Refill: 0    Assessment and Plan Assessment & Plan Right lower extremity pain and swelling after recent falls   Significant pain and swelling in the right knee and foot followed two falls in the past  month. X-rays showed no fractures, but a broken rib was noted. Swelling likely contributes to the pain. Pain management is limited due to existing prescriptions and NSAID contraindications. Advise elevating legs with ankles higher than the heart to reduce swelling. Increase furosemide  dosage to manage swelling, ensuring potassium levels are monitored due to diuretic use. Continue current pain management with oxycodone  as prescribed by Dr. LaVorn.  Left lower extremity pain and swelling after recent falls   Pain and swelling in the left foot and ankle followed the falls. Advise elevating legs with ankles higher than the heart to reduce swelling. Increase furosemide  dosage to manage swelling, ensuring potassium levels are monitored due to diuretic use.  Rib fracture, healing   A rib fracture was sustained during one of the falls. Emphasize caution to prevent further falls, as another fall could exacerbate the rib injury.  Restless legs syndrome   Severe restless legs syndrome significantly disrupts sleep. Currently on ropinirole , but the dose is low and not adequately controlling symptoms. Increase ropinirole  to 2 mg once daily to better manage symptoms.  Acute anemia, likely multifactorial (post-bleeding, chronic disease)   Low hemoglobin contributes to fatigue and decreased endurance. Anemia may be due to chronic disease or recent bleeding from ulcers. Await results of recent blood work to determine iron levels and further evaluate anemia. Consider iron supplementation if indicated by lab results.       Follow-up: Return in about 3 months (around 05/10/2024), or if symptoms worsen or fail to improve.  Butler Der, M.D.

## 2024-02-10 LAB — BMP8+EGFR
BUN/Creatinine Ratio: 24 (ref 12–28)
BUN: 16 mg/dL (ref 8–27)
CO2: 19 mmol/L — AB (ref 20–29)
Calcium: 7.9 mg/dL — AB (ref 8.7–10.3)
Chloride: 101 mmol/L (ref 96–106)
Creatinine, Ser: 0.67 mg/dL (ref 0.57–1.00)
Glucose: 121 mg/dL — AB (ref 70–99)
Potassium: 5.4 mmol/L — AB (ref 3.5–5.2)
Sodium: 132 mmol/L — AB (ref 134–144)
eGFR: 91 mL/min/1.73 (ref >59)

## 2024-02-10 LAB — CBC WITH DIFFERENTIAL/PLATELET
Basophils Absolute: 0 x10E3/uL (ref 0.0–0.2)
Basos: 1 %
EOS (ABSOLUTE): 0.1 x10E3/uL (ref 0.0–0.4)
Eos: 2 %
Hematocrit: 24.7 % — ABNORMAL LOW (ref 34.0–46.6)
Hemoglobin: 7.2 g/dL — CL (ref 11.1–15.9)
Immature Grans (Abs): 0 x10E3/uL (ref 0.0–0.1)
Immature Granulocytes: 0 %
Lymphocytes Absolute: 0.4 x10E3/uL — ABNORMAL LOW (ref 0.7–3.1)
Lymphs: 9 %
MCH: 24.7 pg — ABNORMAL LOW (ref 26.6–33.0)
MCHC: 29.1 g/dL — ABNORMAL LOW (ref 31.5–35.7)
MCV: 85 fL (ref 79–97)
Monocytes Absolute: 0.5 x10E3/uL (ref 0.1–0.9)
Monocytes: 11 %
Neutrophils Absolute: 3.2 x10E3/uL (ref 1.4–7.0)
Neutrophils: 76 %
Platelets: 199 x10E3/uL (ref 150–450)
RBC: 2.92 x10E6/uL — ABNORMAL LOW (ref 3.77–5.28)
RDW: 17.7 % — ABNORMAL HIGH (ref 11.7–15.4)
WBC: 4.1 x10E3/uL (ref 3.4–10.8)

## 2024-02-10 LAB — IRON,TIBC AND FERRITIN PANEL
Ferritin: 30 ng/mL (ref 15–150)
Iron Saturation: 8 — AB (ref 15–55)
Iron: 21 ug/dL — AB (ref 27–139)
Total Iron Binding Capacity: 278 ug/dL (ref 250–450)
UIBC: 257 ug/dL (ref 118–369)

## 2024-02-11 ENCOUNTER — Ambulatory Visit: Payer: Self-pay | Admitting: Gastroenterology

## 2024-02-11 DIAGNOSIS — R197 Diarrhea, unspecified: Secondary | ICD-10-CM | POA: Diagnosis not present

## 2024-02-11 DIAGNOSIS — Z794 Long term (current) use of insulin: Secondary | ICD-10-CM | POA: Diagnosis not present

## 2024-02-11 DIAGNOSIS — R296 Repeated falls: Secondary | ICD-10-CM | POA: Diagnosis not present

## 2024-02-11 DIAGNOSIS — Z7901 Long term (current) use of anticoagulants: Secondary | ICD-10-CM | POA: Diagnosis not present

## 2024-02-11 DIAGNOSIS — E871 Hypo-osmolality and hyponatremia: Secondary | ICD-10-CM | POA: Diagnosis not present

## 2024-02-11 DIAGNOSIS — L89321 Pressure ulcer of left buttock, stage 1: Secondary | ICD-10-CM | POA: Diagnosis not present

## 2024-02-11 LAB — CELIAC DISEASE PANEL
IgA/Immunoglobulin A, Serum: 301 mg/dL (ref 64–422)
Transglutaminase IgA: <2 U/mL (ref 0–3)

## 2024-02-11 NOTE — Telephone Encounter (Signed)
 Fax received from cardiology, clearance to hold Eliquis  for 2 days given.  This was also updated in the telephone note here as below but was not routed originally.  May proceed with scheduling for November.  When calling to schedule please reverify medication list with her and make sure no new changes.  Charmaine Melia, MSN, APRN, FNP-BC, AGACNP-BC [facility] at [facility]

## 2024-02-11 NOTE — Telephone Encounter (Addendum)
 Patient aware of need for transfusion and verbalized understanding.

## 2024-02-11 NOTE — Telephone Encounter (Signed)
 Per [facility] they do not do blood transfusions. Was advised that blood transfusions now go through the [facility]. Patient aware.

## 2024-02-11 NOTE — Telephone Encounter (Signed)
 Patient with diagnosis of A Fib on Eliquis  for anticoagulation.    Procedure: Esophagogastroduodenoscopy (EGD)   Date of procedure: TBD   CHA2DS2-VASc Score = 7  This indicates a 11.2% annual risk of stroke. The patient's score is based upon: CHF History: 1 HTN History: 1 Diabetes History: 1 Stroke History: 0 Vascular Disease History: 1 Age Score: 2 Gender Score: 1     CrCl 95 ml/min Platelet count 199K  Patient has not had an Afib/aflutter ablation or Watchman within the last 3 months or DCCV within the last 30 days   Per office protocol, patient can hold Eliquis  for 2 days prior to procedure.    **This guidance is not considered finalized until pre-operative APP has relayed final recommendations.**

## 2024-02-11 NOTE — Telephone Encounter (Signed)
   Name: Amber Stephenson  DOB: Jan 06, 1946  MRN: [id number]   Primary Cardiologist: Wilbert Bihari, MD  Chart reviewed as part of pre-operative protocol coverage.   We have been asked for guidance to hold oral anticoagulation for upcoming procedure. Per our clinical pharmacist:  Per office protocol, patient can hold Eliquis  for 2 days prior to procedure.      I will route this recommendation to the requesting party via Epic fax function and remove from pre-op pool. Please call with questions.  Jon Garre Keymani Glynn, PA 02/11/2024, 2:01 PM

## 2024-02-12 ENCOUNTER — Telehealth: Payer: Self-pay | Admitting: *Deleted

## 2024-02-12 NOTE — Telephone Encounter (Signed)
 noted

## 2024-02-12 NOTE — Telephone Encounter (Signed)
 [facility] only had 12 tablets of oxycontin  for this month so a new Rx will be have to be sent over since dispensing the 12 will cancel the remainder of her prescription for this month.

## 2024-02-15 ENCOUNTER — Emergency Department (HOSPITAL_COMMUNITY)

## 2024-02-15 ENCOUNTER — Ambulatory Visit: Admitting: Nurse Practitioner

## 2024-02-15 ENCOUNTER — Encounter (HOSPITAL_COMMUNITY): Payer: Self-pay | Admitting: *Deleted

## 2024-02-15 ENCOUNTER — Inpatient Hospital Stay (HOSPITAL_COMMUNITY)

## 2024-02-15 ENCOUNTER — Ambulatory Visit (INDEPENDENT_AMBULATORY_CARE_PROVIDER_SITE_OTHER): Admitting: Family Medicine

## 2024-02-15 ENCOUNTER — Ambulatory Visit: Payer: Self-pay

## 2024-02-15 ENCOUNTER — Encounter: Payer: Self-pay | Admitting: Family Medicine

## 2024-02-15 ENCOUNTER — Inpatient Hospital Stay (HOSPITAL_COMMUNITY)
Admission: EM | Admit: 2024-02-15 | Discharge: 2024-02-25 | DRG: 377 | Disposition: A | Discharge status: Home Health | Source: Ambulatory Visit | Attending: Internal Medicine | Admitting: Internal Medicine

## 2024-02-15 ENCOUNTER — Other Ambulatory Visit: Payer: Self-pay

## 2024-02-15 VITALS — BP 131/82 | HR 73 | Temp 97.8°F | Ht 62.0 in | Wt 186.0 lb

## 2024-02-15 DIAGNOSIS — N179 Acute kidney failure, unspecified: Secondary | ICD-10-CM | POA: Diagnosis not present

## 2024-02-15 DIAGNOSIS — Z823 Family history of stroke: Secondary | ICD-10-CM

## 2024-02-15 DIAGNOSIS — E1140 Type 2 diabetes mellitus with diabetic neuropathy, unspecified: Secondary | ICD-10-CM | POA: Diagnosis present

## 2024-02-15 DIAGNOSIS — K922 Gastrointestinal hemorrhage, unspecified: Secondary | ICD-10-CM

## 2024-02-15 DIAGNOSIS — K5710 Diverticulosis of small intestine without perforation or abscess without bleeding: Secondary | ICD-10-CM | POA: Diagnosis not present

## 2024-02-15 DIAGNOSIS — D631 Anemia in chronic kidney disease: Secondary | ICD-10-CM | POA: Diagnosis present

## 2024-02-15 DIAGNOSIS — Z91041 Radiographic dye allergy status: Secondary | ICD-10-CM

## 2024-02-15 DIAGNOSIS — E785 Hyperlipidemia, unspecified: Secondary | ICD-10-CM | POA: Diagnosis present

## 2024-02-15 DIAGNOSIS — N390 Urinary tract infection, site not specified: Secondary | ICD-10-CM | POA: Diagnosis not present

## 2024-02-15 DIAGNOSIS — E119 Type 2 diabetes mellitus without complications: Secondary | ICD-10-CM | POA: Diagnosis not present

## 2024-02-15 DIAGNOSIS — D62 Acute posthemorrhagic anemia: Secondary | ICD-10-CM | POA: Diagnosis not present

## 2024-02-15 DIAGNOSIS — F319 Bipolar disorder, unspecified: Secondary | ICD-10-CM | POA: Diagnosis present

## 2024-02-15 DIAGNOSIS — B961 Klebsiella pneumoniae [K. pneumoniae] as the cause of diseases classified elsewhere: Secondary | ICD-10-CM | POA: Diagnosis not present

## 2024-02-15 DIAGNOSIS — M8580 Other specified disorders of bone density and structure, unspecified site: Secondary | ICD-10-CM | POA: Diagnosis present

## 2024-02-15 DIAGNOSIS — R0602 Shortness of breath: Secondary | ICD-10-CM | POA: Diagnosis not present

## 2024-02-15 DIAGNOSIS — F419 Anxiety disorder, unspecified: Secondary | ICD-10-CM | POA: Diagnosis present

## 2024-02-15 DIAGNOSIS — G2581 Restless legs syndrome: Secondary | ICD-10-CM | POA: Diagnosis not present

## 2024-02-15 DIAGNOSIS — F4312 Post-traumatic stress disorder, chronic: Secondary | ICD-10-CM | POA: Diagnosis present

## 2024-02-15 DIAGNOSIS — I272 Pulmonary hypertension, unspecified: Secondary | ICD-10-CM | POA: Diagnosis present

## 2024-02-15 DIAGNOSIS — Z79899 Other long term (current) drug therapy: Secondary | ICD-10-CM

## 2024-02-15 DIAGNOSIS — D509 Iron deficiency anemia, unspecified: Secondary | ICD-10-CM | POA: Diagnosis not present

## 2024-02-15 DIAGNOSIS — K254 Chronic or unspecified gastric ulcer with hemorrhage: Secondary | ICD-10-CM | POA: Diagnosis present

## 2024-02-15 DIAGNOSIS — Z860101 Personal history of adenomatous and serrated colon polyps: Secondary | ICD-10-CM

## 2024-02-15 DIAGNOSIS — Z923 Personal history of irradiation: Secondary | ICD-10-CM

## 2024-02-15 DIAGNOSIS — E66811 Obesity, class 1: Secondary | ICD-10-CM | POA: Diagnosis present

## 2024-02-15 DIAGNOSIS — Z820 Family history of epilepsy and other diseases of the nervous system: Secondary | ICD-10-CM

## 2024-02-15 DIAGNOSIS — N1831 Chronic kidney disease, stage 3a: Secondary | ICD-10-CM | POA: Diagnosis present

## 2024-02-15 DIAGNOSIS — Z23 Encounter for immunization: Secondary | ICD-10-CM

## 2024-02-15 DIAGNOSIS — I2510 Atherosclerotic heart disease of native coronary artery without angina pectoris: Secondary | ICD-10-CM | POA: Diagnosis present

## 2024-02-15 DIAGNOSIS — Z8719 Personal history of other diseases of the digestive system: Secondary | ICD-10-CM | POA: Diagnosis not present

## 2024-02-15 DIAGNOSIS — I5021 Acute systolic (congestive) heart failure: Principal | ICD-10-CM

## 2024-02-15 DIAGNOSIS — W19XXXA Unspecified fall, initial encounter: Secondary | ICD-10-CM | POA: Diagnosis present

## 2024-02-15 DIAGNOSIS — Z8249 Family history of ischemic heart disease and other diseases of the circulatory system: Secondary | ICD-10-CM

## 2024-02-15 DIAGNOSIS — R1031 Right lower quadrant pain: Secondary | ICD-10-CM | POA: Diagnosis not present

## 2024-02-15 DIAGNOSIS — E1122 Type 2 diabetes mellitus with diabetic chronic kidney disease: Secondary | ICD-10-CM | POA: Diagnosis present

## 2024-02-15 DIAGNOSIS — R935 Abnormal findings on diagnostic imaging of other abdominal regions, including retroperitoneum: Secondary | ICD-10-CM | POA: Diagnosis not present

## 2024-02-15 DIAGNOSIS — Z794 Long term (current) use of insulin: Secondary | ICD-10-CM | POA: Diagnosis not present

## 2024-02-15 DIAGNOSIS — C55 Malignant neoplasm of uterus, part unspecified: Secondary | ICD-10-CM | POA: Diagnosis not present

## 2024-02-15 DIAGNOSIS — I509 Heart failure, unspecified: Secondary | ICD-10-CM | POA: Diagnosis not present

## 2024-02-15 DIAGNOSIS — G4730 Sleep apnea, unspecified: Secondary | ICD-10-CM | POA: Diagnosis not present

## 2024-02-15 DIAGNOSIS — Z833 Family history of diabetes mellitus: Secondary | ICD-10-CM

## 2024-02-15 DIAGNOSIS — K5520 Angiodysplasia of colon without hemorrhage: Secondary | ICD-10-CM | POA: Diagnosis not present

## 2024-02-15 DIAGNOSIS — K529 Noninfective gastroenteritis and colitis, unspecified: Secondary | ICD-10-CM | POA: Diagnosis present

## 2024-02-15 DIAGNOSIS — K760 Fatty (change of) liver, not elsewhere classified: Secondary | ICD-10-CM | POA: Diagnosis present

## 2024-02-15 DIAGNOSIS — Z811 Family history of alcohol abuse and dependence: Secondary | ICD-10-CM

## 2024-02-15 DIAGNOSIS — K5521 Angiodysplasia of colon with hemorrhage: Principal | ICD-10-CM | POA: Diagnosis present

## 2024-02-15 DIAGNOSIS — Z7983 Long term (current) use of bisphosphonates: Secondary | ICD-10-CM

## 2024-02-15 DIAGNOSIS — K648 Other hemorrhoids: Secondary | ICD-10-CM | POA: Diagnosis present

## 2024-02-15 DIAGNOSIS — I5031 Acute diastolic (congestive) heart failure: Secondary | ICD-10-CM | POA: Diagnosis not present

## 2024-02-15 DIAGNOSIS — E875 Hyperkalemia: Secondary | ICD-10-CM | POA: Diagnosis present

## 2024-02-15 DIAGNOSIS — K644 Residual hemorrhoidal skin tags: Secondary | ICD-10-CM | POA: Diagnosis present

## 2024-02-15 DIAGNOSIS — K5900 Constipation, unspecified: Secondary | ICD-10-CM | POA: Diagnosis present

## 2024-02-15 DIAGNOSIS — Z96611 Presence of right artificial shoulder joint: Secondary | ICD-10-CM | POA: Diagnosis present

## 2024-02-15 DIAGNOSIS — R600 Localized edema: Secondary | ICD-10-CM | POA: Diagnosis not present

## 2024-02-15 DIAGNOSIS — R1032 Left lower quadrant pain: Secondary | ICD-10-CM | POA: Diagnosis not present

## 2024-02-15 DIAGNOSIS — H547 Unspecified visual loss: Secondary | ICD-10-CM | POA: Diagnosis present

## 2024-02-15 DIAGNOSIS — R079 Chest pain, unspecified: Secondary | ICD-10-CM | POA: Diagnosis not present

## 2024-02-15 DIAGNOSIS — I4821 Permanent atrial fibrillation: Secondary | ICD-10-CM | POA: Diagnosis present

## 2024-02-15 DIAGNOSIS — G894 Chronic pain syndrome: Secondary | ICD-10-CM | POA: Diagnosis present

## 2024-02-15 DIAGNOSIS — I130 Hypertensive heart and chronic kidney disease with heart failure and stage 1 through stage 4 chronic kidney disease, or unspecified chronic kidney disease: Secondary | ICD-10-CM | POA: Diagnosis not present

## 2024-02-15 DIAGNOSIS — R601 Generalized edema: Secondary | ICD-10-CM | POA: Diagnosis not present

## 2024-02-15 DIAGNOSIS — J45909 Unspecified asthma, uncomplicated: Secondary | ICD-10-CM | POA: Diagnosis present

## 2024-02-15 DIAGNOSIS — M352 Behcet's disease: Secondary | ICD-10-CM

## 2024-02-15 DIAGNOSIS — Z7984 Long term (current) use of oral hypoglycemic drugs: Secondary | ICD-10-CM

## 2024-02-15 DIAGNOSIS — R109 Unspecified abdominal pain: Secondary | ICD-10-CM | POA: Diagnosis not present

## 2024-02-15 DIAGNOSIS — Z7985 Long-term (current) use of injectable non-insulin antidiabetic drugs: Secondary | ICD-10-CM

## 2024-02-15 DIAGNOSIS — J811 Chronic pulmonary edema: Secondary | ICD-10-CM | POA: Diagnosis not present

## 2024-02-15 DIAGNOSIS — Z91048 Other nonmedicinal substance allergy status: Secondary | ICD-10-CM

## 2024-02-15 DIAGNOSIS — E871 Hypo-osmolality and hyponatremia: Secondary | ICD-10-CM | POA: Diagnosis not present

## 2024-02-15 DIAGNOSIS — K3189 Other diseases of stomach and duodenum: Secondary | ICD-10-CM | POA: Diagnosis present

## 2024-02-15 DIAGNOSIS — I5033 Acute on chronic diastolic (congestive) heart failure: Secondary | ICD-10-CM | POA: Diagnosis not present

## 2024-02-15 DIAGNOSIS — R0989 Other specified symptoms and signs involving the circulatory and respiratory systems: Secondary | ICD-10-CM | POA: Diagnosis not present

## 2024-02-15 DIAGNOSIS — K921 Melena: Secondary | ICD-10-CM | POA: Diagnosis present

## 2024-02-15 DIAGNOSIS — R197 Diarrhea, unspecified: Secondary | ICD-10-CM | POA: Diagnosis not present

## 2024-02-15 DIAGNOSIS — I110 Hypertensive heart disease with heart failure: Secondary | ICD-10-CM | POA: Diagnosis not present

## 2024-02-15 DIAGNOSIS — Z818 Family history of other mental and behavioral disorders: Secondary | ICD-10-CM

## 2024-02-15 DIAGNOSIS — Z6834 Body mass index (BMI) 34.0-34.9, adult: Secondary | ICD-10-CM

## 2024-02-15 DIAGNOSIS — Z7901 Long term (current) use of anticoagulants: Secondary | ICD-10-CM | POA: Diagnosis not present

## 2024-02-15 DIAGNOSIS — I517 Cardiomegaly: Secondary | ICD-10-CM | POA: Diagnosis not present

## 2024-02-15 DIAGNOSIS — K5731 Diverticulosis of large intestine without perforation or abscess with bleeding: Secondary | ICD-10-CM | POA: Diagnosis present

## 2024-02-15 DIAGNOSIS — E876 Hypokalemia: Secondary | ICD-10-CM | POA: Diagnosis not present

## 2024-02-15 DIAGNOSIS — Z8542 Personal history of malignant neoplasm of other parts of uterus: Secondary | ICD-10-CM

## 2024-02-15 DIAGNOSIS — G4733 Obstructive sleep apnea (adult) (pediatric): Secondary | ICD-10-CM | POA: Diagnosis present

## 2024-02-15 DIAGNOSIS — K625 Hemorrhage of anus and rectum: Secondary | ICD-10-CM | POA: Diagnosis not present

## 2024-02-15 DIAGNOSIS — R140 Abdominal distension (gaseous): Secondary | ICD-10-CM | POA: Diagnosis not present

## 2024-02-15 DIAGNOSIS — F411 Generalized anxiety disorder: Secondary | ICD-10-CM | POA: Diagnosis present

## 2024-02-15 DIAGNOSIS — Z91040 Latex allergy status: Secondary | ICD-10-CM

## 2024-02-15 DIAGNOSIS — Z888 Allergy status to other drugs, medicaments and biological substances status: Secondary | ICD-10-CM

## 2024-02-15 LAB — PHOSPHORUS: Phosphorus: 3 mg/dL (ref 2.5–4.6)

## 2024-02-15 LAB — COMPREHENSIVE METABOLIC PANEL WITH GFR
ALT: 12 U/L (ref 0–44)
AST: 21 U/L (ref 15–41)
Albumin: 3.1 g/dL — ABNORMAL LOW (ref 3.5–5.0)
Alkaline Phosphatase: 127 U/L — ABNORMAL HIGH (ref 38–126)
Anion gap: 8 (ref 5–15)
BUN: 23 mg/dL (ref 8–23)
CO2: 20 mmol/L — ABNORMAL LOW (ref 22–32)
Calcium: 7.5 mg/dL — ABNORMAL LOW (ref 8.9–10.3)
Chloride: 102 mmol/L (ref 98–111)
Creatinine, Ser: 1.3 mg/dL — ABNORMAL HIGH (ref 0.44–1.00)
GFR, Estimated: 43 mL/min — ABNORMAL LOW (ref >60)
Glucose, Bld: 106 mg/dL — ABNORMAL HIGH (ref 70–99)
Potassium: 5.7 mmol/L — ABNORMAL HIGH (ref 3.5–5.1)
Sodium: 130 mmol/L — ABNORMAL LOW (ref 135–145)
Total Bilirubin: 1 mg/dL (ref 0.0–1.2)
Total Protein: 6.4 g/dL — ABNORMAL LOW (ref 6.5–8.1)

## 2024-02-15 LAB — CBC WITH DIFFERENTIAL/PLATELET
Abs Immature Granulocytes: 0.03 K/uL (ref 0.00–0.07)
Basophils Absolute: 0 K/uL (ref 0.0–0.1)
Basophils Relative: 0 %
Eosinophils Absolute: 0.1 K/uL (ref 0.0–0.5)
Eosinophils Relative: 1 %
HCT: 26.3 % — ABNORMAL LOW (ref 36.0–46.0)
Hemoglobin: 8 g/dL — ABNORMAL LOW (ref 12.0–15.0)
Immature Granulocytes: 1 %
Lymphocytes Relative: 8 %
Lymphs Abs: 0.5 K/uL — ABNORMAL LOW (ref 0.7–4.0)
MCH: 25.4 pg — ABNORMAL LOW (ref 26.0–34.0)
MCHC: 30.4 g/dL (ref 30.0–36.0)
MCV: 83.5 fL (ref 80.0–100.0)
Monocytes Absolute: 0.9 K/uL (ref 0.1–1.0)
Monocytes Relative: 14 %
Neutro Abs: 5 K/uL (ref 1.7–7.7)
Neutrophils Relative %: 76 %
Platelets: 158 K/uL (ref 150–400)
RBC: 3.15 MIL/uL — ABNORMAL LOW (ref 3.87–5.11)
RDW: 19.6 % — ABNORMAL HIGH (ref 11.5–15.5)
WBC: 6.5 K/uL (ref 4.0–10.5)
nRBC: 0 % (ref 0.0–0.2)

## 2024-02-15 LAB — C DIFFICILE QUICK SCREEN W PCR REFLEX
C Diff antigen: NEGATIVE
C Diff interpretation: NOT DETECTED
C Diff toxin: NEGATIVE

## 2024-02-15 LAB — MAGNESIUM: Magnesium: 1.9 mg/dL (ref 1.7–2.4)

## 2024-02-15 LAB — GLUCOSE, CAPILLARY
Glucose-Capillary: 110 mg/dL — ABNORMAL HIGH (ref 70–99)
Glucose-Capillary: 127 mg/dL — ABNORMAL HIGH (ref 70–99)

## 2024-02-15 LAB — BRAIN NATRIURETIC PEPTIDE: B Natriuretic Peptide: 664 pg/mL — ABNORMAL HIGH (ref 0.0–100.0)

## 2024-02-15 MED ORDER — ACETAMINOPHEN 325 MG PO TABS
650.0000 mg | ORAL_TABLET | Freq: Four times a day (QID) | ORAL | Status: DC | PRN
  Administered 2024-02-20 – 2024-02-21 (×3): 650 mg via ORAL
  Filled 2024-02-15 (×3): qty 2

## 2024-02-15 MED ORDER — ALPRAZOLAM 0.5 MG PO TABS
0.5000 mg | ORAL_TABLET | Freq: Two times a day (BID) | ORAL | Status: DC | PRN
  Administered 2024-02-15 – 2024-02-24 (×10): 0.5 mg via ORAL
  Filled 2024-02-15 (×10): qty 1

## 2024-02-15 MED ORDER — ACETAMINOPHEN 650 MG RE SUPP: 650.0000 mg | Freq: Four times a day (QID) | RECTAL | Status: DC | PRN

## 2024-02-15 MED ORDER — INSULIN ASPART 100 UNIT/ML IJ SOLN
0.0000 [IU] | Freq: Three times a day (TID) | INTRAMUSCULAR | Status: DC
  Administered 2024-02-17 – 2024-02-18 (×3): 1 [IU] via SUBCUTANEOUS
  Administered 2024-02-18 – 2024-02-19 (×2): 2 [IU] via SUBCUTANEOUS
  Administered 2024-02-19 (×2): 1 [IU] via SUBCUTANEOUS
  Administered 2024-02-20: 2 [IU] via SUBCUTANEOUS
  Administered 2024-02-20: 1 [IU] via SUBCUTANEOUS
  Administered 2024-02-20: 2 [IU] via SUBCUTANEOUS
  Administered 2024-02-21 (×2): 3 [IU] via SUBCUTANEOUS
  Administered 2024-02-21: 1 [IU] via SUBCUTANEOUS
  Administered 2024-02-22: 3 [IU] via SUBCUTANEOUS
  Administered 2024-02-22: 2 [IU] via SUBCUTANEOUS
  Administered 2024-02-22: 3 [IU] via SUBCUTANEOUS
  Administered 2024-02-23: 1 [IU] via SUBCUTANEOUS
  Administered 2024-02-24 – 2024-02-25 (×4): 3 [IU] via SUBCUTANEOUS

## 2024-02-15 MED ORDER — SODIUM CHLORIDE 0.9% FLUSH
3.0000 mL | Freq: Two times a day (BID) | INTRAVENOUS | Status: DC
  Administered 2024-02-15 – 2024-02-24 (×20): 3 mL via INTRAVENOUS

## 2024-02-15 MED ORDER — SODIUM CHLORIDE 0.9% FLUSH: 3.0000 mL | INTRAVENOUS | Status: DC | PRN

## 2024-02-15 MED ORDER — CALCIUM GLUCONATE 10 % IV SOLN
1.0000 g | Freq: Once | INTRAVENOUS | Status: AC
  Administered 2024-02-15: 1 g via INTRAVENOUS
  Filled 2024-02-15: qty 10

## 2024-02-15 MED ORDER — PANTOPRAZOLE SODIUM 40 MG PO TBEC
40.0000 mg | DELAYED_RELEASE_TABLET | Freq: Two times a day (BID) | ORAL | Status: DC
Start: 2024-02-15 — End: 2024-02-22
  Administered 2024-02-16 – 2024-02-22 (×13): 40 mg via ORAL
  Filled 2024-02-15 (×13): qty 1

## 2024-02-15 MED ORDER — SODIUM CHLORIDE 0.9 % IV SOLN: 250.0000 mL | INTRAVENOUS | Status: AC | PRN

## 2024-02-15 MED ORDER — SODIUM ZIRCONIUM CYCLOSILICATE 5 G PO PACK
10.0000 g | PACK | Freq: Once | ORAL | Status: AC
  Administered 2024-02-15: 10 g via ORAL
  Filled 2024-02-15: qty 2

## 2024-02-15 MED ORDER — ROPINIROLE HCL 1 MG PO TABS
1.0000 mg | ORAL_TABLET | Freq: Two times a day (BID) | ORAL | Status: DC
  Administered 2024-02-15 – 2024-02-25 (×20): 1 mg via ORAL
  Filled 2024-02-15: qty 4
  Filled 2024-02-15 (×9): qty 1
  Filled 2024-02-15: qty 4
  Filled 2024-02-15 (×9): qty 1

## 2024-02-15 MED ORDER — METOPROLOL TARTRATE 50 MG PO TABS
50.0000 mg | ORAL_TABLET | Freq: Two times a day (BID) | ORAL | Status: DC
  Administered 2024-02-15 – 2024-02-25 (×20): 50 mg via ORAL
  Filled 2024-02-15 (×20): qty 1

## 2024-02-15 MED ORDER — INSULIN ASPART 100 UNIT/ML IJ SOLN
0.0000 [IU] | Freq: Every day | INTRAMUSCULAR | Status: DC
  Administered 2024-02-21: 3 [IU] via SUBCUTANEOUS

## 2024-02-15 MED ORDER — FUROSEMIDE 10 MG/ML IJ SOLN
40.0000 mg | Freq: Once | INTRAMUSCULAR | Status: AC
Start: 2024-02-15 — End: 2024-02-15
  Administered 2024-02-15: 40 mg via INTRAVENOUS
  Filled 2024-02-15: qty 4

## 2024-02-15 MED ORDER — OXCARBAZEPINE 150 MG PO TABS
150.0000 mg | ORAL_TABLET | Freq: Two times a day (BID) | ORAL | Status: DC
  Administered 2024-02-15 – 2024-02-25 (×20): 150 mg via ORAL
  Filled 2024-02-15 (×30): qty 1

## 2024-02-15 MED ORDER — ESCITALOPRAM OXALATE 10 MG PO TABS
10.0000 mg | ORAL_TABLET | Freq: Every day | ORAL | Status: DC
  Administered 2024-02-16 – 2024-02-25 (×10): 10 mg via ORAL
  Filled 2024-02-15 (×10): qty 1

## 2024-02-15 MED ORDER — PANTOPRAZOLE SODIUM 40 MG IV SOLR
80.0000 mg | Freq: Once | INTRAVENOUS | Status: AC
Start: 2024-02-15 — End: 2024-02-15
  Administered 2024-02-15: 80 mg via INTRAVENOUS
  Filled 2024-02-15: qty 20

## 2024-02-15 MED ORDER — ONDANSETRON HCL 4 MG/2ML IJ SOLN: 4.0000 mg | Freq: Four times a day (QID) | INTRAMUSCULAR | Status: DC | PRN

## 2024-02-15 MED ORDER — ARIPIPRAZOLE 2 MG PO TABS
2.0000 mg | ORAL_TABLET | Freq: Every day | ORAL | Status: DC
  Administered 2024-02-15 – 2024-02-24 (×10): 2 mg via ORAL
  Filled 2024-02-15 (×11): qty 1

## 2024-02-15 MED ORDER — ATORVASTATIN CALCIUM 40 MG PO TABS
80.0000 mg | ORAL_TABLET | Freq: Every day | ORAL | Status: DC
  Administered 2024-02-15 – 2024-02-24 (×10): 80 mg via ORAL
  Filled 2024-02-15 (×10): qty 2

## 2024-02-15 MED ORDER — ONDANSETRON HCL 4 MG PO TABS: 4.0000 mg | ORAL_TABLET | Freq: Four times a day (QID) | ORAL | Status: DC | PRN

## 2024-02-15 MED ORDER — LOPERAMIDE HCL 2 MG PO CAPS
4.0000 mg | ORAL_CAPSULE | Freq: Once | ORAL | Status: AC
  Administered 2024-02-15: 4 mg via ORAL
  Filled 2024-02-15: qty 2

## 2024-02-15 MED ORDER — OXYCODONE HCL ER 20 MG PO T12A
20.0000 mg | EXTENDED_RELEASE_TABLET | Freq: Two times a day (BID) | ORAL | Status: DC
  Administered 2024-02-15 – 2024-02-25 (×20): 20 mg via ORAL
  Filled 2024-02-15 (×20): qty 1

## 2024-02-15 MED ORDER — HYDROMORPHONE HCL 1 MG/ML IJ SOLN
0.5000 mg | INTRAMUSCULAR | Status: DC | PRN
  Filled 2024-02-15: qty 0.5

## 2024-02-15 MED ORDER — FUROSEMIDE 10 MG/ML IJ SOLN
40.0000 mg | Freq: Two times a day (BID) | INTRAMUSCULAR | Status: DC
  Administered 2024-02-16 – 2024-02-18 (×4): 40 mg via INTRAVENOUS
  Filled 2024-02-15 (×4): qty 4

## 2024-02-15 MED ORDER — GABAPENTIN 100 MG PO CAPS
100.0000 mg | ORAL_CAPSULE | Freq: Three times a day (TID) | ORAL | Status: DC
Start: 2024-02-15 — End: 2024-02-25
  Administered 2024-02-15 – 2024-02-25 (×29): 100 mg via ORAL
  Filled 2024-02-15 (×29): qty 1

## 2024-02-15 NOTE — Telephone Encounter (Signed)
 FYI Only or Action Required?: Action required by provider: request for appointment.  Patient was last seen in primary care on 02/09/2024 by [name], MD.  Called Nurse Triage reporting Shortness of Breath.  Symptoms began several days ago.  Interventions attempted: Rest, hydration, or home remedies.  Symptoms are: gradually worsening. SOB, using oxygen  more at home. Also has watery diarrhea.  Triage Disposition: See HCP Within 4 Hours (Or PCP Triage)  Patient/caregiver understands and will follow disposition?: Yes   Copied from CRM #[id number]. Topic: Clinical - Red Word Triage >> Feb 15, 2024  7:47 AM Ahlexyia S wrote: Red Word that prompted transfer to Nurse Triage: Pt called in stating she has been having severe diarrhea for 3 days, pt stated it has gotten worse at time progresses. Pt is having trouble breathing, shortness of breaths. Pt does have a heart condition. Warm transferred to nurse triage. Answer Assessment - Initial Assessment Questions 1. RESPIRATORY STATUS: Describe your breathing? (e.g., wheezing, shortness of breath, unable to speak, severe coughing)      SOB 2. ONSET: When did this breathing problem begin?       3 Days ago 3. PATTERN Does the difficult breathing come and go, or has it been constant since it started?      constant 4. SEVERITY: How bad is your breathing? (e.g., mild, moderate, severe)      moderate 5. RECURRENT SYMPTOM: Have you had difficulty breathing before? If Yes, ask: When was the last time? and What happened that time?      yes 6. CARDIAC HISTORY: Do you have any history of heart disease? (e.g., heart attack, angina, bypass surgery, angioplasty)      yes 7. LUNG HISTORY: Do you have any history of lung disease?  (e.g., pulmonary embolus, asthma, emphysema)     Pulmonary hypertension 8. CAUSE: What do you think is causing the breathing problem?      Above 9. OTHER SYMPTOMS: Do you have any other symptoms? (e.g., chest  pain, cough, dizziness, fever, runny nose)     diarrhea 10. O2 SATURATION MONITOR:  Do you use an oxygen  saturation monitor (pulse oximeter) at home? If Yes, ask: What is your reading (oxygen  level) today? What is your usual oxygen  saturation reading? (e.g., 95%)       90% 11. PREGNANCY: Is there any chance you are pregnant? When was your last menstrual period?       no 12. TRAVEL: Have you traveled out of the country in the last month? (e.g., travel history, exposures)       no  Protocols used: Breathing Difficulty-A-AH  Reason for Disposition  [1] MILD difficulty breathing (e.g., minimal/no SOB at rest, SOB with walking, pulse < 100) AND [2] NEW-onset or WORSE than normal  Answer Assessment - Initial Assessment Questions 1. RESPIRATORY STATUS: Describe your breathing? (e.g., wheezing, shortness of breath, unable to speak, severe coughing)      SOB 2. ONSET: When did this breathing problem begin?       3 Days ago 3. PATTERN Does the difficult breathing come and go, or has it been constant since it started?      constant 4. SEVERITY: How bad is your breathing? (e.g., mild, moderate, severe)      moderate 5. RECURRENT SYMPTOM: Have you had difficulty breathing before? If Yes, ask: When was the last time? and What happened that time?      yes 6. CARDIAC HISTORY: Do you have any history of heart disease? (e.g., heart  attack, angina, bypass surgery, angioplasty)      yes 7. LUNG HISTORY: Do you have any history of lung disease?  (e.g., pulmonary embolus, asthma, emphysema)     Pulmonary hypertension 8. CAUSE: What do you think is causing the breathing problem?      Above 9. OTHER SYMPTOMS: Do you have any other symptoms? (e.g., chest pain, cough, dizziness, fever, runny nose)     diarrhea 10. O2 SATURATION MONITOR:  Do you use an oxygen  saturation monitor (pulse oximeter) at home? If Yes, ask: What is your reading (oxygen  level) today? What is your  usual oxygen  saturation reading? (e.g., 95%)       90% 11. PREGNANCY: Is there any chance you are pregnant? When was your last menstrual period?       no 12. TRAVEL: Have you traveled out of the country in the last month? (e.g., travel history, exposures)       no  Protocols used: Breathing Difficulty-A-AH

## 2024-02-15 NOTE — ED Notes (Signed)
 When connecting pt to monitor, pt seemed SOB. I asked her if she is normally on O2 at home and she said yes, 2L. O2 was originally 91. After 2L nasal cannula O2 increased to 98.

## 2024-02-15 NOTE — H&P (Signed)
 History and Physical    Patient: Amber Stephenson FMW:[id number] DOB: May 29, 1946 DOA: 02/15/2024 DOS: the patient was seen and examined on 02/15/2024  PCP: [name], MD   Patient coming from: Home  Chief Complaint:  Chief Complaint  Patient presents with   Abdominal Pain   Diarrhea   HPI: Amber Stephenson is a 78 y.o. female with medical history significant of endometrial cancer, atrial fibrillation on Eliquis , bipolar disorder type I, chronic pain syndrome, coronary artery disease, chronic kidney disease stage IIIa, type 2 diabetes with nephropathy, hypertension, nonalcoholic fatty liver disease, obstructive sleep apnea, restless leg syndrome and pulmonary hypertension; who presented to the emergency department secondary to melanotic diarrhea, intermittent abdominal pain and increased shortness of breath.  Patient reports symptom has been present for the last 3-4 days and worsening.  No associated nausea, no vomiting, no sick contacts, no changes in her medications.  She was seen by her PCP on the day of admission where she was instructed to presented to the ED for further evaluation, management and possible blood transfusion.  Workup demonstrated hemoglobin of 8, renal function slightly worsened from baseline with a creatinine up to 1.3 and elevated BNP of 664 with a chest x-ray demonstrating cardiomegaly and vascular congestion.  GI service was consulted, GI panel requested and patient started on IV Lasix .  TRH has been consulted to place patient in the hospital for further evaluation and management.   Review of Systems: As mentioned in the history of present illness. All other systems reviewed and are negative. 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   Benign paroxysmal positional vertigo due to bilateral vestibular disorder 05/20/2019   Bipolar 1 disorder (HCC) 01/23/2015   with GAD, benzo dependence   CAD (coronary artery disease)    Nonobstructive; Managed by Dr. Charls   Cardiomegaly 01/12/2018   CHF (congestive heart failure) 06/08/2022   Chronic back pain 01/04/2015   Chronic constipation 04/25/2020   Chronic diastolic heart failure (HCC) 05/30/2015   Chronic pain syndrome 08/22/2019   back pain, sacroiliitis   Chronic post-traumatic stress disorder (PTSD) 12/06/2020   Diabetic neuropathy (HCC) 02/06/2016   Dyslipidemia 09/24/2020   Essential hypertension    Functional diarrhea 10/26/2020   Herpes genitalis in women 07/16/2015   History of adenomatous polyp of colon 05/21/2019   Overview:   03/31/17: Colonoscopy: nonadvanced adenoma, microscopic colitis, f/u 5 yrs, [facility]/GAP   History of radiation therapy    Endometrial- 02/18/23-03/31/23-Dr. Lynwood Kinard   Hypotension 09/01/2022   Insomnia 01/23/2015   Metabolic acidosis 01/12/2023   Migraine headache with aura 02/12/2016   Myofascial pain dysfunction syndrome 08/22/2019   Non-alcoholic fatty liver disease 01/12/2018   OSA (obstructive sleep apnea) 02/24/2019   10/09/2018 - HST  - AHI 40.6    Osteopenia 12/06/2020   Rx alendronate  35 mg.   Pinguecula 12/06/2020   Presbyopia 12/06/2020   Pulmonary hypertension    RLS (restless legs syndrome) 04/27/2015   RSV (respiratory syncytial virus infection) 06/10/2023   Tremor, essential 12/11/2021   Type II diabetes mellitus (HCC) 09/24/2020   Past Surgical History:  Procedure Laterality Date   BREAST REDUCTION SURGERY     COLONOSCOPY  2018   6 mm cecal tubular adenoma, sigmoid diverticulosis,  random colon biopsies consistent with microscopic colitis   COLONOSCOPY N/A 01/22/2024   Procedure: COLONOSCOPY;  Surgeon: Eartha Angelia Sieving, MD;  Location: [facility];  Service: Gastroenterology;  Laterality: N/A;   ESOPHAGOGASTRODUODENOSCOPY N/A 01/22/2024   Procedure: EGD (ESOPHAGOGASTRODUODENOSCOPY);  Surgeon: Eartha Angelia, Sieving, MD;  Location: [facility];  Service: Gastroenterology;  Laterality: N/A;   EYE SURGERY Right    cateracts   HAMMER TOE SURGERY     LEFT HEART CATH AND CORONARY ANGIOGRAPHY N/A 02/03/2018   Procedure: LEFT HEART CATH AND CORONARY ANGIOGRAPHY;  Surgeon: [name], Peter M, MD;  Location: [facility] INVASIVE CV LAB;  Service: Cardiovascular;  Laterality: N/A;   REVERSE SHOULDER ARTHROPLASTY Right 08/19/2019   Procedure: REVERSE SHOULDER ARTHROPLASTY;  Surgeon: Kay Kemps, MD;  Location: [facility];  Service: Orthopedics;  Laterality: Right;  interscalene block   SHOULDER SURGERY Right    I BROKE MY SHOUDLER   THIGH SURGERY     TO REMOVE A TUMOR    Social History:  reports that she has never smoked. She has never used smokeless tobacco. She reports that she does not currently use alcohol . She reports that she does not use drugs.  Allergies  Allergen Reactions   Iodinated Contrast Media Anaphylaxis, Swelling and Other (See Comments)    Throat closes, swelling of throat and tongue   Iodine Anaphylaxis   Latex Other (See Comments)    Latex tape pulls skin with it   Tape Other (See Comments)    Pulls off the skin, if latex   Tizanidine  Other (See Comments)    Weakness, goofy, bad dreams    Family History  Problem Relation Age of Onset   Diabetes Mother    Heart disease Mother    Alzheimer's disease Father    Heart disease Sister        CABG   Diabetes Sister    Alcohol  abuse Sister    Stroke Brother    Heart disease Brother    Mental illness Brother    Diabetes Brother    Breast cancer Neg Hx    Ovarian cancer Neg Hx    Colon cancer Neg Hx    Endometrial cancer Neg Hx     Prior to Admission medications   Medication Sig Start Date End Date Taking? Authorizing Provider  acetaminophen  (TYLENOL ) 325 MG  tablet Take 2 tablets (650 mg total) by mouth every 6 (six) hours as needed for mild pain (pain score 1-3) or fever (or Fever >/= 101). 01/23/24  Yes Emokpae, Courage, MD  ALPRAZolam  (XANAX ) 1 MG tablet Take 1 mg by mouth at bedtime as needed for sleep. 12/21/23  Yes [provider]  ARIPiprazole  (ABILIFY ) 2 MG tablet Take 1 tablet (2 mg total) by mouth at bedtime. 01/23/24  Yes Emokpae, Courage, MD  atorvastatin  (LIPITOR) 80 MG tablet Take 1 tablet (80 mg total) by mouth daily. Patient taking differently: Take 80 mg by mouth at bedtime. 01/23/24 04/22/24 Yes Emokpae, Courage, MD  carbidopa -levodopa  (SINEMET ) 10-100 MG tablet Take 1 tablet by mouth 4 (four) times daily. For restless leg syndrome 01/23/24  Yes Emokpae, Courage, MD  colchicine  0.6 MG tablet Take 1 tablet (0.6 mg total) by mouth 2 (two) times daily. For mouth sores 01/07/24  Yes Stacks, Butler, MD  cyanocobalamin  (VITAMIN B12) 1000 MCG tablet Take 1 tablet (1,000 mcg total) by mouth daily. 09/04/22  Yes Ghimire, Donalda HERO, MD  diphenoxylate -atropine  (LOMOTIL ) 2.5-0.025 MG tablet TAKE 2 TABLETS BY MOUTH FOUR TIMES DAILY  AS NEEDED FOR DIARRHEA OR LOOSE STOOLS Patient taking differently: Take 2 tablets by mouth 4 (four) times daily as needed for diarrhea or loose stools. 11/24/23  Yes Stacks, Butler, MD  ELIQUIS  5 MG TABS tablet TAKE 1 TABLET TWICE A DAY 11/02/23  Yes Turner, Wilbert SAUNDERS, MD  escitalopram  (LEXAPRO ) 10 MG tablet Take 10 mg by mouth daily.   Yes [provider]  furosemide  (LASIX ) 40 MG tablet Take 1 tablet (40 mg total) by mouth daily. Patient taking differently: Take 10 mg by mouth 2 (two) times daily. 01/23/24  Yes Emokpae, Courage, MD  gabapentin  (NEURONTIN ) 100 MG capsule Take 1 capsule (100 mg total) by mouth 3 (three) times daily. 01/23/24  Yes Pearlean Manus, MD  hydrOXYzine  (VISTARIL ) 25 MG capsule Take 1 capsule (25 mg total) by mouth every 8 (eight) hours as needed for anxiety. 01/23/24  Yes Pearlean Manus, MD   insulin  glargine (LANTUS  SOLOSTAR) 100 UNIT/ML Solostar Pen Inject 10 Units into the skin at bedtime. 01/23/24  Yes Pearlean Manus, MD  insulin  lispro (HUMALOG  KWIKPEN) 100 UNIT/ML KwikPen Inject 0-10 Units into the skin 2 (two) times daily before a meal. Sliding scale Short acting Humalog  insulin  per sliding scale 0-10 ---:-- Insulin  injection 0-10 Units 0-10 Units Subcutaneous, 3 times daily with meals CBG 70 - 120: 0 unit CBG 121 - 150: 0 unit  CBG 151 - 200: 1 unit CBG 201 - 250: 2 units CBG 251 - 300: 4 units CBG 301 - 350: 6 units  CBG 351 - 400: 8 units  CBG > 400: 10 units 01/23/24  Yes Emokpae, Courage, MD  losartan  (COZAAR ) 25 MG tablet Take 25 mg by mouth daily. 02/14/23  Yes [provider]  MAGNESIUM  PO Take 1 tablet by mouth daily.   Yes [provider]  metFORMIN  (GLUCOPHAGE -XR) 500 MG 24 hr tablet Take 2 tablets (1,000 mg total) by mouth daily with breakfast. 01/23/24  Yes Emokpae, Courage, MD  Methocarbamol  1000 MG TABS Take 1,000 mg by mouth every 6 (six) hours as needed. Patient taking differently: Take 1,000 mg by mouth every 6 (six) hours as needed (muscle spasms). 08/31/23  Yes Lovorn, Megan, MD  metoprolol  tartrate (LOPRESSOR ) 50 MG tablet Take 1 tablet (50 mg total) by mouth 2 (two) times daily. 01/23/24  Yes Emokpae, Courage, MD  ondansetron  (ZOFRAN ) 4 MG tablet Take 1 tablet (4 mg total) by mouth every 8 (eight) hours as needed for nausea or vomiting. For cancer related nausea 01/01/24  Yes Dettinger, Fonda LABOR, MD  OXcarbazepine  (TRILEPTAL ) 150 MG tablet Take 1 tablet (150 mg total) by mouth 2 (two) times daily. 06/13/22  Yes Sheikh, Omair Latif, DO  oxyCODONE  (OXYCONTIN ) 20 mg 12 hr tablet Take 1 tablet (20 mg total) by mouth every 12 (twelve) hours. G89.3- chronic pain- fill 28 days after 8/25- Rx 01/08/24  Yes Lovorn, Megan, MD  pantoprazole  (PROTONIX ) 40 MG tablet Take 1 tablet (40 mg total) by mouth 2 (two) times daily. 01/23/24  Yes Emokpae, Courage, MD   potassium chloride  (KLOR-CON ) 10 MEQ tablet Take 1 tablet (10 mEq total) by mouth daily. Patient taking differently: Take 10 mEq by mouth at bedtime. 01/29/24  Yes Turner, Wilbert SAUNDERS, MD  rOPINIRole  (REQUIP ) 2 MG tablet Take 1 tablet (2 mg total) by mouth at bedtime. 02/09/24  Yes Zollie Butler, MD  tirzepatide  (MOUNJARO ) 12.5 MG/0.5ML Pen Inject 12.5 mg into the skin once a week. Patient taking differently: Inject 12.5 mg into the skin every Sunday.  09/21/23  Yes Shamleffer, Donell Cardinal, MD  Vitamin D , Cholecalciferol , 25 MCG (1000 UT) TABS Take 1,000 Units by mouth in the morning. 07/26/20  Yes [provider]  Continuous Glucose Sensor (DEXCOM G7 SENSOR) MISC CHANGE SENSOR EVERY 10 DAYS 09/28/23   Shamleffer, Donell Cardinal, MD  Insulin  Pen Needle 29G X MISC 1 Device by Does not apply route daily in the afternoon. 05/22/23   Shamleffer, Donell Cardinal, MD  Misc. Devices (3-IN-1 BEDSIDE TOILET) MISC Length of need 99 months M17.11 Osteoarthritis right knee / multiple falls / high risk fall 02/08/24   Margrette Taft BRAVO, MD    Physical Exam: Vitals:   02/15/24 1530 02/15/24 1723 02/15/24 2047 02/15/24 2052  BP: (!) 142/81 (!) 154/75 (!) 152/75 (!) 152/75  Pulse: 75 74 82 82  Resp: 16  18   Temp:   98 F (36.7 C)   TempSrc:   Oral   SpO2: 100% 97% 96%   Weight:      Height:       General exam: Alert, awake, oriented x 3; using 2-3 L nasal cannula supplementation for comfort (good saturation appreciated).  Reported intermittent abdominal discomfort and diarrhea. Respiratory system: Decreased breath sounds at the bases, no using accessory muscles.  Fine crackles on exam.  No wheezing Cardiovascular system: Rate controlled, no rubs, no gallops, no JVD. Gastrointestinal system: Abdomen is obese, nondistended, soft and vague lower abdominal discomfort reported.  Positive bowel sounds. Central nervous system:  No focal neurological deficits. Extremities: No cyanosis or clubbing.   Trace to 1+ edema appreciated bilaterally. Skin: No petechiae. Psychiatry: Judgement and insight appear normal. Mood & affect appropriate.   Data Reviewed: BNP: 664 Comprehensive metabolic panel: Sodium 130, potassium 5.7, chloride 102, bicarb 20, glucose 106, BUN 23, creatinine 1.30, normal LFTs except for alkaline phosphatase of 127; GFR 43. CBC: White blood cell 6.5, hemoglobin 8.0 and platelet count 158K   Assessment and Plan: 1-melanotic diarrhea and intermittent abdominal pain --Holding Eliquis  -Follow hemoglobin trend and transfuse as needed -GI service consulted and will follow recommendations - Continue supportive care.  2-acute on chronic diastolic heart failure - Update echo - Daily weights, strict intake and output - Heart healthy/low-sodium diet discussed with patient - IV Lasix  has been started - Follow ultralights and renal function closely  3-acute kidney injury on chronic kidney disease stage IIIa at baseline - Most likely in the setting of decreased perfusion from CHF exacerbation - Maintain adequate oral - Minimize nephrotoxic agents - Continue IV Lasix  - Close follow-up outpatient renal function trend.  4-hyperkalemia - Lokelma  x 1 given - Patient will be on Lasix  which will also assist elevating potassium level - Telemetry monitoring - Follow trend.  5-depression with anxiety - Continue Lexapro , Abilify  and as needed Xanax  - No suicidal ideation appreciated on exam.  6-hyperlipidemia - Continue statin  7-type 2 diabetes mellitus with neuropathy - Holding oral hypoglycemic agents while inpatient - Continue sliding scale insulin  - Follow CBG fluctuation - Continue treatment with Neurontin   8-restless leg syndrome/chronic pain - Continue Requip  and home analgesic therapy (OxyContin  every 12 hours and as needed Dilaudid  for breakthrough).  9-class I obesity -Body mass index is 34.02 kg/m. - Low-calorie diet and portion control discussed with  patient.    Advance Care Planning:   Code Status: Full Code   Consults: GI service  Family Communication: No family at bedside.  Severity of Illness: The appropriate patient status for this patient is INPATIENT. Inpatient status is  judged to be reasonable and necessary in order to provide the required intensity of service to ensure the patient's safety. The patient's presenting symptoms, physical exam findings, and initial radiographic and laboratory data in the context of their chronic comorbidities is felt to place them at high risk for further clinical deterioration. Furthermore, it is not anticipated that the patient will be medically stable for discharge from the hospital within 2 midnights of admission.   * I certify that at the point of admission it is my clinical judgment that the patient will require inpatient hospital care spanning beyond 2 midnights from the point of admission due to high intensity of service, high risk for further deterioration and high frequency of surveillance required.*  Author: Eric Nunnery, MD 02/15/2024 9:45 PM  For on call review [email or web address].

## 2024-02-15 NOTE — Progress Notes (Deleted)
 Subjective:  Patient ID: Amber Stephenson, female    DOB: Sep 17, 1945, 78 y.o.   MRN: [id number]  Patient Care Team: [name], MD as PCP - General (Family Medicine) Shlomo Wilbert SAUNDERS, MD as PCP - Cardiology (Cardiology) Kay Lynwood SQUIBB, MD as Referring Physician (Orthopedic Surgery) Tasia Lung, MD as Consulting Physician (Psychiatry) Georjean Darice HERO, MD as Consulting Physician (Neurology) [facility], Donell Cardinal, MD as Consulting Physician (Endocrinology) Charmayne Molly, MD as Consulting Physician (Ophthalmology) Roddie Bring, DPM as Consulting Physician (Podiatry) Shlomo Wilbert SAUNDERS, MD as Consulting Physician (Cardiology) Shlomo Wilbert SAUNDERS, MD as Consulting Physician (Cardiology) Wertman, Sara E, PA-C (Neurology) Neda Jennet LABOR, MD as Consulting Physician (Pulmonary Disease) Parrett, Madelin RAMAN, NP as Nurse Practitioner (Pulmonary Disease) Bertrum Rosina HERO, RN as [facility] Management (General Practice) Bertrum Rosina HERO, RN   Chief Complaint:  No chief complaint on file.   HPI: Amber Stephenson is a 78 y.o. female presenting on 02/15/2024 for No chief complaint on file.   Discussed the use of AI scribe software for clinical note transcription with the patient, who gave verbal consent to proceed.  History of Present Illness       Relevant past medical, surgical, family, and social history reviewed and updated as indicated.  Allergies and medications reviewed and updated. Data reviewed: Chart in Epic.   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   Benign paroxysmal positional vertigo due to bilateral vestibular disorder 05/20/2019   Bipolar 1 disorder (HCC) 01/23/2015   with GAD, benzo  dependence   CAD (coronary artery disease)    Nonobstructive; Managed by Dr. Charls   Cardiomegaly 01/12/2018   CHF (congestive heart failure) 06/08/2022   Chronic back pain 01/04/2015   Chronic constipation 04/25/2020   Chronic diastolic heart failure (HCC) 05/30/2015   Chronic pain syndrome 08/22/2019   back pain, sacroiliitis   Chronic post-traumatic stress disorder (PTSD) 12/06/2020   Diabetic neuropathy (HCC) 02/06/2016   Dyslipidemia 09/24/2020   Essential hypertension    Functional diarrhea 10/26/2020   Herpes genitalis in women 07/16/2015   History of adenomatous polyp of colon 05/21/2019   Overview:   03/31/17: Colonoscopy: nonadvanced adenoma, microscopic colitis, f/u 5 yrs, [facility]/GAP   History of radiation therapy    Endometrial- 02/18/23-03/31/23-Dr. Lynwood Kinard   Hypotension 09/01/2022   Insomnia 01/23/2015   Metabolic acidosis 01/12/2023   Migraine headache with aura 02/12/2016   Myofascial pain dysfunction syndrome 08/22/2019   Non-alcoholic fatty liver disease 01/12/2018   OSA (obstructive sleep apnea) 02/24/2019   10/09/2018 - HST  - AHI 40.6    Osteopenia 12/06/2020   Rx alendronate  35 mg.   Pinguecula 12/06/2020   Presbyopia 12/06/2020   Pulmonary hypertension    RLS (restless legs syndrome) 04/27/2015   RSV (respiratory syncytial virus infection) 06/10/2023   Tremor, essential 12/11/2021   Type II diabetes mellitus (HCC) 09/24/2020    Past Surgical History:  Procedure Laterality Date   BREAST REDUCTION SURGERY     COLONOSCOPY  2018   6 mm cecal tubular adenoma, sigmoid diverticulosis, random colon biopsies consistent with microscopic colitis   COLONOSCOPY N/A 01/22/2024   Procedure: COLONOSCOPY;  Surgeon: Eartha Angelia Sieving, MD;  Location: [facility];  Service: Gastroenterology;  Laterality: N/A;   ESOPHAGOGASTRODUODENOSCOPY N/A 01/22/2024   Procedure: EGD (ESOPHAGOGASTRODUODENOSCOPY);  Surgeon: Eartha Flavors, Toribio, MD;  Location:  [facility];  Service: Gastroenterology;  Laterality: N/A;   EYE SURGERY Right    cateracts   HAMMER TOE SURGERY     LEFT HEART CATH AND CORONARY ANGIOGRAPHY N/A 02/03/2018   Procedure: LEFT HEART CATH AND CORONARY ANGIOGRAPHY;  Surgeon: [name], Peter M, MD;  Location: [facility] INVASIVE CV LAB;  Service: Cardiovascular;  Laterality: N/A;   REVERSE SHOULDER ARTHROPLASTY Right 08/19/2019   Procedure: REVERSE SHOULDER ARTHROPLASTY;  Surgeon: Kay Kemps, MD;  Location: [facility];  Service: Orthopedics;  Laterality: Right;  interscalene block   SHOULDER SURGERY Right    I BROKE MY SHOUDLER   THIGH SURGERY     TO REMOVE A TUMOR     Social History   Socioeconomic History   Marital status: Married    Spouse name: Dwight   Number of children: 3   Years of education: 15   Highest education level: Associate degree: academic program  Occupational History   Occupation: Retired    Comment: Chief Financial Officer  Tobacco Use   Smoking status: Never   Smokeless tobacco: Never  Vaping Use   Vaping status: Never Used  Substance and Sexual Activity   Alcohol  use: Not Currently    Alcohol /week: 0.0 standard drinks of alcohol    Drug use: No   Sexual activity: Not Currently    Partners: Male    Birth control/protection: Post-menopausal  Other Topics Concern   Not on file  Social History Narrative   Lives at home with husband.    They have 3 children who live away - [place] , [place] , and [place] .   She is from [place]  and most of her family lives there.   Her husbands's family live nearby   Right handed.   Social Drivers of Corporate investment banker Strain: Low Risk  (08/13/2023)   Overall Financial Resource Strain (CARDIA)    Difficulty of Paying Living Expenses: Not hard at all  Food Insecurity: No Food Insecurity (01/19/2024)   Hunger Vital Sign    Worried About Running Out of Food in the Last Year: Never true    Ran Out of Food in the Last Year: Never true  Transportation Needs: No  Transportation Needs (01/19/2024)   PRAPARE - Administrator, Civil Service (Medical): No    Lack of Transportation (Non-Medical): No  Physical Activity: Insufficiently Active (08/13/2023)   Exercise Vital Sign    Days of Exercise per Week: 3 days    Minutes of Exercise per Session: 20 min  Stress: No Stress Concern Present (08/13/2023)   [employer] of Occupational Health - Occupational Stress Questionnaire    Feeling of Stress : Only a little  Social Connections: Moderately Integrated (01/19/2024)   Social Connection and Isolation Panel    Frequency of Communication with Friends and Family: More than three times a week    Frequency of Social Gatherings with Friends and Family: Once a week    Attends Religious Services: More than 4 times per year    Active Member of [employer] or Organizations: Patient unable to answer    Attends Banker Meetings: Never    Marital Status: Married  Catering manager Violence: Not At Risk (01/19/2024)   Humiliation, Afraid, Rape, and Kick questionnaire    Fear of Current or Ex-Partner: No    Emotionally Abused: No    Physically Abused: No  Sexually Abused: No    Outpatient Encounter Medications as of 02/15/2024  Medication Sig   acetaminophen  (TYLENOL ) 325 MG tablet Take 2 tablets (650 mg total) by mouth every 6 (six) hours as needed for mild pain (pain score 1-3) or fever (or Fever >/= 101).   ALPRAZolam  (XANAX ) 1 MG tablet Take 1 mg by mouth at bedtime as needed for anxiety or sleep.   ARIPiprazole  (ABILIFY ) 2 MG tablet Take 1 tablet (2 mg total) by mouth at bedtime.   atorvastatin  (LIPITOR) 80 MG tablet Take 1 tablet (80 mg total) by mouth daily.   carbidopa -levodopa  (SINEMET ) 10-100 MG tablet Take 1 tablet by mouth 4 (four) times daily. For restless leg syndrome   colchicine  0.6 MG tablet Take 1 tablet (0.6 mg total) by mouth 2 (two) times daily. For mouth sores   Continuous Glucose Sensor (DEXCOM G7 SENSOR) MISC CHANGE  SENSOR EVERY 10 DAYS   cyanocobalamin  (VITAMIN B12) 1000 MCG tablet Take 1 tablet (1,000 mcg total) by mouth daily.   denosumab  (PROLIA ) 60 MG/ML SOSY injection Inject 60 mg into the skin every 6 (six) months.   diclofenac  (VOLTAREN ) 75 MG EC tablet Take 75 mg by mouth 2 (two) times daily.   diphenoxylate -atropine  (LOMOTIL ) 2.5-0.025 MG tablet TAKE 2 TABLETS BY MOUTH FOUR TIMES DAILY AS NEEDED FOR DIARRHEA OR LOOSE STOOLS   ELIQUIS  5 MG TABS tablet TAKE 1 TABLET TWICE A DAY   escitalopram  (LEXAPRO ) 10 MG tablet Take 10 mg by mouth daily.   furosemide  (LASIX ) 40 MG tablet Take 1 tablet (40 mg total) by mouth daily.   gabapentin  (NEURONTIN ) 100 MG capsule Take 1 capsule (100 mg total) by mouth 3 (three) times daily.   hydrOXYzine  (VISTARIL ) 25 MG capsule Take 1 capsule (25 mg total) by mouth every 8 (eight) hours as needed for anxiety.   insulin  glargine (LANTUS  SOLOSTAR) 100 UNIT/ML Solostar Pen Inject 10 Units into the skin at bedtime.   insulin  lispro (HUMALOG  KWIKPEN) 100 UNIT/ML KwikPen Inject 0-10 Units into the skin 2 (two) times daily before a meal. Sliding scale Short acting Humalog  insulin  per sliding scale 0-10 ---:-- Insulin  injection 0-10 Units 0-10 Units Subcutaneous, 3 times daily with meals CBG 70 - 120: 0 unit CBG 121 - 150: 0 unit  CBG 151 - 200: 1 unit CBG 201 - 250: 2 units CBG 251 - 300: 4 units CBG 301 - 350: 6 units  CBG 351 - 400: 8 units  CBG > 400: 10 units   Insulin  Pen Needle 29G X MISC 1 Device by Does not apply route daily in the afternoon.   losartan  (COZAAR ) 25 MG tablet Take 25 mg by mouth daily.   magic mouthwash (nystatin , lidocaine , diphenhydrAMINE ) suspension Take 5 mLs by mouth 3 (three) times daily as needed for mouth pain.   MAGNESIUM  PO Take 1 tablet by mouth daily.   metFORMIN  (GLUCOPHAGE -XR) 500 MG 24 hr tablet Take 2 tablets (1,000 mg total) by mouth daily with breakfast.   Methocarbamol  1000 MG TABS Take 1,000 mg by mouth every 6 (six) hours as needed.    metoprolol  tartrate (LOPRESSOR ) 50 MG tablet Take 1 tablet (50 mg total) by mouth 2 (two) times daily.   Misc. Devices (3-IN-1 BEDSIDE TOILET) MISC Length of need 99 months M17.11 Osteoarthritis right knee / multiple falls / high risk fall   omeprazole  (PRILOSEC) 40 MG capsule Take 40 mg by mouth 2 (two) times daily.   ondansetron  (ZOFRAN ) 4 MG tablet Take 1  tablet (4 mg total) by mouth every 8 (eight) hours as needed for nausea or vomiting. For cancer related nausea   OXcarbazepine  (TRILEPTAL ) 150 MG tablet Take 1 tablet (150 mg total) by mouth 2 (two) times daily.   oxyCODONE  (OXYCONTIN ) 20 mg 12 hr tablet Take 1 tablet (20 mg total) by mouth every 12 (twelve) hours. G89.3- chronic pain- fill 28 days after 8/25- Rx   pantoprazole  (PROTONIX ) 40 MG tablet Take 1 tablet (40 mg total) by mouth 2 (two) times daily.   potassium chloride  (KLOR-CON ) 10 MEQ tablet Take 1 tablet (10 mEq total) by mouth daily.   rOPINIRole  (REQUIP ) 2 MG tablet Take 1 tablet (2 mg total) by mouth at bedtime.   tirzepatide  (MOUNJARO ) 12.5 MG/0.5ML Pen Inject 12.5 mg into the skin once a week.   Vitamin D , Cholecalciferol , 25 MCG (1000 UT) TABS Take 1,000 Units by mouth in the morning.   Facility-Administered Encounter Medications as of 02/15/2024  Medication   bupivacaine -epinephrine  (MARCAINE  W/ EPI) 0.5% -1:200000 injection   denosumab  (PROLIA ) injection 60 mg    Allergies  Allergen Reactions   Iodinated Contrast Media Anaphylaxis, Swelling and Other (See Comments)    Throat closes, swelling of throat and tongue   Iodine Anaphylaxis   Latex Other (See Comments)    Latex tape pulls skin with it   Tape Other (See Comments)    Pulls off the skin, if latex   Tizanidine  Other (See Comments)    Weakness, goofy, bad dreams    Pertinent ROS per HPI, otherwise unremarkable      Objective:  There were no vitals taken for this visit.   Wt Readings from Last 3 Encounters:  02/09/24 186 lb 12.8 oz (84.7 kg)   02/09/24 186 lb 12.8 oz (84.7 kg)  02/04/24 181 lb (82.1 kg)    Physical Exam Physical Exam      Results for orders placed or performed in visit on 02/09/24  Celiac Disease Panel   Collection Time: 02/09/24  2:14 PM  Result Value Ref Range   Endomysial IgA Negative Negative   Transglutaminase IgA <2 0 - 3 U/mL   IgA/Immunoglobulin A, Serum 301 64 - 422 mg/dL   *Note: Due to a large number of results and/or encounters for the requested time period, some results have not been displayed. A complete set of results can be found in Results Review.       Pertinent labs & imaging results that were available during my care of the patient were reviewed by me and considered in my medical decision making.  Assessment & Plan:  There are no diagnoses linked to this encounter.   Assessment and Plan Assessment & Plan       Continue all other maintenance medications.  Follow up plan: No follow-ups on file.   Continue healthy lifestyle choices, including diet (rich in fruits, vegetables, and lean proteins, and low in salt and simple carbohydrates) and exercise (at least 30 minutes of moderate physical activity daily).  Educational handout given for ***  The above assessment and management plan was discussed with the patient. The patient verbalized understanding of and has agreed to the management plan. Patient is aware to call the clinic if they develop any new symptoms or if symptoms persist or worsen. Patient is aware when to return to the clinic for a follow-up visit. Patient educated on when it is appropriate to go to the emergency department.  @SIGNATURE @

## 2024-02-15 NOTE — Progress Notes (Signed)
 Subjective:  Patient ID: Amber Stephenson, female    DOB: [birth year]  Age: 78 y.o. MRN: [id number]  CC: Diarrhea (W/ blood/SOB)   HPI  Discussed the use of AI scribe software for clinical note transcription with the patient, who gave verbal consent to proceed.  History of Present Illness Amber Stephenson is a 78 year old female with anemia who presents with shortness of breath, diarrhea, and rectal bleeding.  She has been experiencing severe diarrhea, described as 'black and runny,' occurring dozens of times a day since before Thursday. Today, she noted passing pure blood instead of stool, which she describes as a recurring issue but more severe this time. Despite not eating much, the diarrhea persists, and she questions the source of the volume of diarrhea.  She experiences shortness of breath with minimal exertion, such as brushing her teeth or walking to bed, requiring her to use oxygen  intermittently. She does not wear oxygen  continuously.  She reports significant bloating, with her abdominal stretch marks becoming more pronounced and raised. This bloating has been present for about two weeks.  She has a history of anemia, confirmed by recent blood work showing low iron saturation and hemoglobin levels. She started iron pills today and is scheduled for a blood transfusion today due to her low hemoglobin, which was 7.2 a week ago.  She underwent a colonoscopy on January 22, 2024, where some polyps were removed. She has a history of external hemorrhoids and angiodysplastic lesions treated with argon plasma coagulation.  She is currently taking multiple medications, including iron supplements.          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), Benign paroxysmal positional vertigo due to bilateral vestibular disorder (05/20/2019), Bipolar 1 disorder (HCC) (01/23/2015), CAD (coronary artery disease), Cardiomegaly (01/12/2018), CHF (congestive heart failure) (06/08/2022), Chronic back pain (01/04/2015), Chronic constipation (04/25/2020), Chronic diastolic heart failure (HCC) (87/71/7983), Chronic pain syndrome (08/22/2019), Chronic post-traumatic stress disorder (PTSD) (12/06/2020), Diabetic neuropathy (HCC) (02/06/2016), Dyslipidemia (09/24/2020), Essential hypertension, Functional diarrhea (10/26/2020), Herpes genitalis in women (07/16/2015), History of adenomatous polyp of colon (05/21/2019), History of radiation therapy, Hypotension (09/01/2022), Insomnia (01/23/2015), Metabolic acidosis (01/12/2023), Migraine headache with aura (02/12/2016), Myofascial pain dysfunction syndrome (08/22/2019), Non-alcoholic fatty liver disease (91/86/7980), OSA (obstructive sleep apnea) (02/24/2019), Osteopenia (12/06/2020), Pinguecula (12/06/2020), Presbyopia (12/06/2020), Pulmonary hypertension, RLS (restless legs syndrome) (04/27/2015), RSV (respiratory syncytial virus infection) (06/10/2023), Tremor, essential (12/11/2021), and Type II diabetes mellitus (HCC) (09/24/2020).   She has a past surgical history that includes THIGH SURGERY; Shoulder surgery (Right); Breast reduction surgery; Eye surgery (Right); Hammer toe surgery; LEFT HEART CATH AND CORONARY ANGIOGRAPHY (N/A, 02/03/2018); Reverse shoulder arthroplasty (Right, 08/19/2019); Colonoscopy (2018); Esophagogastroduodenoscopy (N/A, 01/22/2024); and Colonoscopy (N/A, 01/22/2024).   Her family history includes Alcohol  abuse in her sister; Alzheimer's disease in her father;  Diabetes in her brother, mother, and sister; Heart disease in her brother, mother, and sister; Mental illness in her brother; Stroke in her brother.She reports that she has never smoked. She  has never used smokeless tobacco. She reports that she does not currently use alcohol . She reports that she does not use drugs.    ROS Review of Systems  Constitutional: Negative.   HENT: Negative.  Negative for congestion.   Eyes:  Negative for visual disturbance.  Respiratory:  Negative for shortness of breath.   Cardiovascular:  Negative for chest pain.  Gastrointestinal:  Positive for abdominal distention, abdominal pain, blood in stool, diarrhea and nausea. Negative for constipation and vomiting.  Genitourinary:  Negative for difficulty urinating.  Musculoskeletal:  Negative for arthralgias and myalgias.  Neurological:  Negative for headaches.  Psychiatric/Behavioral:  Negative for sleep disturbance.     Objective:  BP 131/82   Pulse 73   Temp 97.8 F (36.6 C)   Ht 5' 2 (1.575 m)   Wt 186 lb (84.4 kg)   SpO2 96%   BMI 34.02 kg/m   BP Readings from Last 3 Encounters:  02/15/24 131/82  02/09/24 110/74  02/09/24 (!) 149/92    Wt Readings from Last 3 Encounters:  02/15/24 186 lb (84.4 kg)  02/09/24 186 lb 12.8 oz (84.7 kg)  02/09/24 186 lb 12.8 oz (84.7 kg)     Physical Exam Constitutional:      General: She is not in acute distress.    Appearance: She is well-developed.  HENT:     Head: Normocephalic and atraumatic.  Eyes:     Conjunctiva/sclera: Conjunctivae normal.     Pupils: Pupils are equal, round, and reactive to light.  Neck:     Thyroid : No thyromegaly.  Cardiovascular:     Rate and Rhythm: Normal rate and regular rhythm.     Heart sounds: Normal heart sounds. No murmur heard. Pulmonary:     Effort: Pulmonary effort is normal. No respiratory distress.     Breath sounds: Normal breath sounds. No wheezing or rales.  Abdominal:     General: Bowel sounds are  normal. There is distension.     Palpations: Abdomen is soft.     Tenderness: There is abdominal tenderness. There is guarding.  Musculoskeletal:        General: Normal range of motion.     Cervical back: Normal range of motion and neck supple.  Lymphadenopathy:     Cervical: No cervical adenopathy.  Skin:    General: Skin is warm and dry.  Neurological:     Mental Status: She is alert and oriented to person, place, and time.  Psychiatric:        Behavior: Behavior normal.        Thought Content: Thought content normal.        Judgment: Judgment normal.      Assessment & Plan:  Acute lower GI bleeding  Shortness of breath    Assessment and Plan Assessment & Plan Acute lower gastrointestinal bleeding with acute blood loss anemia   Severe acute lower gastrointestinal bleeding has led to significant blood loss and acute blood loss anemia. Hemoglobin is 7.2 and likely lower due to ongoing bleeding. Iron saturation is low at 8. Recent colonoscopy revealed rectal bleeding and angiodysplastic lesions treated with argon plasma coagulation. Possible causes include hemorrhoids or post-procedural bleeding. She should proceed to the ER for evaluation and blood transfusion. Discontinue Eliquis  due to bleeding risk. Follow up with the gastroenterologist who performed the colonoscopy.  Chronic diarrhea and noninfective colitis, status post recent endoscopic treatment for colonic angiodysplasia   Chronic diarrhea has recently worsened. A recent colonoscopy treated angiodysplastic lesions. Differential  includes colitis, possibly infectious or other causes. She should proceed to the ER for evaluation of diarrhea and potential colitis. Consider starting Entocort (budesonide ) if not admitted to the hospital to reduce bowel inflammation.  External hemorrhoids   A large external hemorrhoid may contribute to bleeding. Recent colonoscopy noted hemorrhoids.  Abdominal bloating and distension    Significant abdominal bloating and distension have exacerbated pre-existing stretch marks, possibly related to gastrointestinal issues. She should proceed to the ER for evaluation of abdominal bloating and distension.  Shortness of breath on exertion   Shortness of breath on exertion may be related to anemia and overall deconditioning. She experiences exhaustion with minimal activity and is not consistently using supplemental oxygen . Encourage the use of supplemental oxygen  as needed.  Medication management   She is on multiple medications, including Eliquis  and diclofenac , which may increase bleeding risk. Discontinue Eliquis  and diclofenac  until further notice. Reassess the medication regimen at follow-up to reduce potential adverse effects.  Go to E.D. from here. Get transfusion as ordered and eval for GI bleed.     Follow-up: Return in about 1 week (around 02/22/2024). Consider entocort trial  Butler Der, M.D.

## 2024-02-15 NOTE — ED Notes (Signed)
 Pt requested to wait on more meds until she gets to eat something.

## 2024-02-15 NOTE — Patient Instructions (Signed)
 DC eliquis  and diclofenac 

## 2024-02-15 NOTE — Plan of Care (Signed)
  Problem: Nutrition: Goal: Adequate nutrition will be maintained Outcome: Progressing   Problem: Nutrition: Goal: Adequate nutrition will be maintained Outcome: Progressing   

## 2024-02-15 NOTE — ED Provider Notes (Addendum)
 [place] EMERGENCY DEPARTMENT AT [facility] ([place]) Provider Note   CSN: [id number] Arrival date & time: 02/15/24  1106     Patient presents with: Abdominal Pain and Diarrhea   Amber Stephenson is a 78 y.o. female.   HPI     78 y.o. female with past medical history of permanent atrial fibrillation (on Eliquis ), HFpEF, HTN, HLD, Type 2 DM, Stage 2-3 CKD, asthma, OSA (intolerant to CPAP), and nonobstructive CAD comes in with chief complaint of worsening leg swelling, increasing abdominal girth.  Patient accompanied by her husband, who also provides collateral history.  Patient reports abdominal pain that is in the lower quadrant with dark, tarry stools for the last 4 days.  Today she also noted some dark blood.  Patient has been on iron for few days, but the stools changed before the iron supplement was initiated.  She had a colonoscopy and upper endoscopy done recently as well for workup for her anemia, and was noted to have nonbleeding ulcers and there were some polyps that were resected.  Additionally, patient reports that she has noted worsening leg swelling and increasing abdominal girth with lower quadrant abdominal discomfort.  Patient has history of pulmonary hypertension and CHF.  She suspects about 5 pound weight gain in the last week.  Prior to Admission medications   Medication Sig Start Date End Date Taking? Authorizing Provider  acetaminophen  (TYLENOL ) 325 MG tablet Take 2 tablets (650 mg total) by mouth every 6 (six) hours as needed for mild pain (pain score 1-3) or fever (or Fever >/= 101). 01/23/24   Pearlean, Courage, MD  ALPRAZolam  (XANAX ) 1 MG tablet Take 1 mg by mouth at bedtime as needed for anxiety or sleep. 12/21/23   [provider]  ARIPiprazole  (ABILIFY ) 2 MG tablet Take 1 tablet (2 mg total) by mouth at bedtime. 01/23/24   Pearlean Manus, MD  atorvastatin  (LIPITOR) 80 MG tablet Take 1 tablet (80 mg total) by mouth daily. 01/23/24 04/22/24  Pearlean Manus, MD  carbidopa -levodopa  (SINEMET ) 10-100 MG tablet Take 1 tablet by mouth 4 (four) times daily. For restless leg syndrome 01/23/24   Pearlean Manus, MD  colchicine  0.6 MG tablet Take 1 tablet (0.6 mg total) by mouth 2 (two) times daily. For mouth sores 01/07/24   [name], MD  Continuous Glucose Sensor (DEXCOM G7 SENSOR) MISC CHANGE SENSOR EVERY 10 DAYS 09/28/23   Shamleffer, Donell Cardinal, MD  cyanocobalamin  (VITAMIN B12) 1000 MCG tablet Take 1 tablet (1,000 mcg total) by mouth daily. 09/04/22   Ghimire, Donalda HERO, MD  diphenoxylate -atropine  (LOMOTIL ) 2.5-0.025 MG tablet TAKE 2 TABLETS BY MOUTH FOUR TIMES DAILY AS NEEDED FOR DIARRHEA OR LOOSE STOOLS 11/24/23   [name], MD  ELIQUIS  5 MG TABS tablet TAKE 1 TABLET TWICE A DAY 11/02/23   Shlomo Wilbert SAUNDERS, MD  escitalopram  (LEXAPRO ) 10 MG tablet Take 10 mg by mouth daily.    [provider]  furosemide  (LASIX ) 40 MG tablet Take 1 tablet (40 mg total) by mouth daily. 01/23/24   Pearlean Manus, MD  gabapentin  (NEURONTIN ) 100 MG capsule Take 1 capsule (100 mg total) by mouth 3 (three) times daily. 01/23/24   Pearlean Manus, MD  hydrOXYzine  (VISTARIL ) 25 MG capsule Take 1 capsule (25 mg total) by mouth every 8 (eight) hours as needed for anxiety. 01/23/24   Pearlean Manus, MD  insulin  glargine (LANTUS  SOLOSTAR) 100 UNIT/ML Solostar Pen Inject 10 Units into the skin at bedtime. 01/23/24   Pearlean Manus, MD  insulin  lispro (HUMALOG  KWIKPEN) 100 UNIT/ML KwikPen Inject 0-10 Units into the skin 2 (two) times daily before a meal. Sliding scale Short acting Humalog  insulin  per sliding scale 0-10 ---:-- Insulin  injection 0-10 Units 0-10 Units Subcutaneous, 3 times daily with meals CBG 70 - 120: 0 unit CBG 121 - 150: 0 unit  CBG 151 - 200: 1 unit CBG 201 - 250: 2 units CBG 251 - 300: 4 units CBG 301 - 350: 6 units  CBG 351 - 400: 8 units  CBG > 400: 10 units 01/23/24   Emokpae, Courage, MD  Insulin  Pen Needle 29G X MISC 1 Device by Does  not apply route daily in the afternoon. 05/22/23   Shamleffer, Ibtehal Jaralla, MD  losartan  (COZAAR ) 25 MG tablet Take 25 mg by mouth daily. 02/14/23   [provider]  magic mouthwash (nystatin , lidocaine , diphenhydrAMINE ) suspension Take 5 mLs by mouth 3 (three) times daily as needed for mouth pain. 01/28/24   [name], MD  MAGNESIUM  PO Take 1 tablet by mouth daily.    [provider]  metFORMIN  (GLUCOPHAGE -XR) 500 MG 24 hr tablet Take 2 tablets (1,000 mg total) by mouth daily with breakfast. 01/23/24   Pearlean, Courage, MD  Methocarbamol  1000 MG TABS Take 1,000 mg by mouth every 6 (six) hours as needed. 08/31/23   Lovorn, Megan, MD  metoprolol  tartrate (LOPRESSOR ) 50 MG tablet Take 1 tablet (50 mg total) by mouth 2 (two) times daily. 01/23/24   Pearlean Manus, MD  Misc. Devices (3-IN-1 BEDSIDE TOILET) MISC Length of need 99 months M17.11 Osteoarthritis right knee / multiple falls / high risk fall 02/08/24   Margrette Taft BRAVO, MD  omeprazole  (PRILOSEC) 40 MG capsule Take 40 mg by mouth 2 (two) times daily. 01/29/24   [provider]  ondansetron  (ZOFRAN ) 4 MG tablet Take 1 tablet (4 mg total) by mouth every 8 (eight) hours as needed for nausea or vomiting. For cancer related nausea 01/01/24   Dettinger, Fonda LABOR, MD  OXcarbazepine  (TRILEPTAL ) 150 MG tablet Take 1 tablet (150 mg total) by mouth 2 (two) times daily. 06/13/22   Sherrill Cable Latif, DO  oxyCODONE  (OXYCONTIN ) 20 mg 12 hr tablet Take 1 tablet (20 mg total) by mouth every 12 (twelve) hours. G89.3- chronic pain- fill 28 days after 8/25- Rx 01/08/24   Lovorn, Megan, MD  pantoprazole  (PROTONIX ) 40 MG tablet Take 1 tablet (40 mg total) by mouth 2 (two) times daily. 01/23/24   Pearlean Manus, MD  potassium chloride  (KLOR-CON ) 10 MEQ tablet Take 1 tablet (10 mEq total) by mouth daily. 01/29/24   Shlomo Wilbert SAUNDERS, MD  rOPINIRole  (REQUIP ) 2 MG tablet Take 1 tablet (2 mg total) by mouth at bedtime. 02/09/24   [name], MD   tirzepatide  (MOUNJARO ) 12.5 MG/0.5ML Pen Inject 12.5 mg into the skin once a week. 09/21/23   Shamleffer, Donell Cardinal, MD  Vitamin D , Cholecalciferol , 25 MCG (1000 UT) TABS Take 1,000 Units by mouth in the morning. 07/26/20   [provider]    Allergies: Iodinated contrast media, Iodine, Latex, Tape, and Tizanidine     Review of Systems  All other systems reviewed and are negative.   Updated Vital Signs BP (!) 151/96   Pulse 78   Resp 17   Ht 5' 2 (1.575 m)   Wt 84.4 kg   SpO2 100%   BMI 34.02 kg/m   Physical Exam Vitals and nursing note reviewed.  Constitutional:      Appearance: She  is well-developed.  HENT:     Head: Atraumatic.  Cardiovascular:     Rate and Rhythm: Normal rate.  Pulmonary:     Effort: Pulmonary effort is normal.  Abdominal:     Tenderness: There is abdominal tenderness in the right lower quadrant, suprapubic area and left lower quadrant. There is no guarding or rebound.  Musculoskeletal:     Cervical back: Normal range of motion and neck supple.  Skin:    General: Skin is warm and dry.     Comments: Bilateral lower extremity pitting edema, left worse than right  Neurological:     Mental Status: She is alert and oriented to person, place, and time.     (all labs ordered are listed, but only abnormal results are displayed) Labs Reviewed  COMPREHENSIVE METABOLIC PANEL WITH GFR - Abnormal; Notable for the following components:      Result Value   Sodium 130 (*)    Potassium 5.7 (*)    CO2 20 (*)    Glucose, Bld 106 (*)    Creatinine, Ser 1.30 (*)    Calcium  7.5 (*)    Total Protein 6.4 (*)    Albumin  3.1 (*)    Alkaline Phosphatase 127 (*)    GFR, Estimated 43 (*)    All other components within normal limits  CBC WITH DIFFERENTIAL/PLATELET - Abnormal; Notable for the following components:   RBC 3.15 (*)    Hemoglobin 8.0 (*)    HCT 26.3 (*)    MCH 25.4 (*)    RDW 19.6 (*)    Lymphs Abs 0.5 (*)    All other components  within normal limits  BRAIN NATRIURETIC PEPTIDE - Abnormal; Notable for the following components:   B Natriuretic Peptide 664.0 (*)    All other components within normal limits  URINALYSIS, ROUTINE W REFLEX MICROSCOPIC  TYPE AND SCREEN    EKG: None  Radiology: DG Chest Port 1 View Result Date: 02/15/2024 CLINICAL DATA:  Pain EXAM: PORTABLE CHEST 1 VIEW COMPARISON:  01/21/2024 FINDINGS: Cardiomegaly, vascular congestion. Interstitial prominence may reflect interstitial edema. No effusions or acute bony abnormality. IMPRESSION: Continued interstitial edema pattern, not significantly changed since prior study. Electronically Signed   By: Franky Crease M.D.   On: 02/15/2024 13:44   US  Venous Img Lower Unilateral Left Result Date: 02/15/2024 CLINICAL DATA:  Left lower extremity edema EXAM: LEFT LOWER EXTREMITY VENOUS DOPPLER ULTRASOUND TECHNIQUE: Gray-scale sonography with compression, as well as color and duplex ultrasound, were performed to evaluate the deep venous system(s) from the level of the common femoral vein through the popliteal and proximal calf veins. COMPARISON:  None Available. FINDINGS: VENOUS Normal compressibility of the common femoral, superficial femoral, and popliteal veins, as well as the visualized calf veins. Visualized portions of profunda femoral vein and great saphenous vein unremarkable. No filling defects to suggest DVT on grayscale or color Doppler imaging. Doppler waveforms show normal direction of venous flow, normal respiratory plasticity and response to augmentation. Limited views of the contralateral common femoral vein are unremarkable. OTHER Superficial subcutaneous edema along the left calf. Limitations: none IMPRESSION: 1. No evidence of acute DVT. 2. Superficial subcutaneous edema may reflect volume overload or cellulitis. Electronically Signed   By: Wilkie Lent M.D.   On: 02/15/2024 13:32     .Critical Care  Performed by: Charlyn Sora, MD Authorized  by: Charlyn Sora, MD   Critical care provider statement:    Critical care time (minutes):  43   Critical care  was necessary to treat or prevent imminent or life-threatening deterioration of the following conditions:  Cardiac failure, renal failure and metabolic crisis   Critical care was time spent personally by me on the following activities:  Development of treatment plan with patient or surrogate, discussions with consultants, evaluation of patient's response to treatment, examination of patient, ordering and review of laboratory studies, ordering and review of radiographic studies, ordering and performing treatments and interventions, pulse oximetry, re-evaluation of patient's condition, review of old charts and obtaining history from patient or surrogate    Medications Ordered in the ED  pantoprazole  (PROTONIX ) injection 80 mg (has no administration in time range)  furosemide  (LASIX ) injection 40 mg (has no administration in time range)  calcium  gluconate inj 10% (1 g) URGENT USE ONLY! (has no administration in time range)  loperamide  (IMODIUM ) capsule 4 mg (4 mg Oral Given 02/15/24 1420)  sodium zirconium cyclosilicate  (LOKELMA ) packet 10 g (10 g Oral Given 02/15/24 1420)    Clinical Course as of 02/15/24 1424  Mon Feb 15, 2024  1410 Hemoglobin(!): 8.0 At baseline for the patient for the last 3 weeks [AN]  1411 BUN: 23 [AN]  1411 Creatinine(!): 1.30 Mild AKI noted.  Mild hyper-K noted. Suspect cardiorenal in this case given the volume overload.  Will give her Lasix . [AN]  1411 Hemoglobin(!): 8.0 GI team to see the patient.  IV Protonix  given. [AN]    Clinical Course User Index [AN] Charlyn Sora, MD                                 Medical Decision Making Amount and/or Complexity of Data Reviewed Labs: ordered. Decision-making details documented in ED Course. Radiology: ordered.  Risk Prescription drug management. Decision regarding hospitalization.   78 y.o. female  with past medical history of permanent atrial fibrillation (on Eliquis ), HFpEF, CAD HTN, HLD, Type 2 DM, Stage 2-3 CKD, severe anemia with recent colonoscopy and upper endoscopy, pulmonary hypertension comes in with chief complaint of bloody stools, and increasing leg swelling and abdominal girth.  the complaint involves an extensive differential diagnosis and also carries with it a high risk of complications and morbidity.   Abdominal exam does not reveal any peritonitis.  Patient does have pitting edema bilaterally, left worse than right.  She is already on Eliquis .  She appears to have increased abdominal girth but no peritonitis.  The differential diagnosis includes : Acute CHF, acute DVT, liver cirrhosis, hepatic congestion, venous stasis,  Lymphedema, worsening renal failure, nephrotic syndrome.  The differential diagnosis considered for the melena includes:  Esophagitis PUD/Gastritis/ulcers Diverticular bleed Colitis -including ischemic colitis Post colonoscopy complication  The initial plan is to get basic labs, ultrasound lower extremity DVT, BNP, chest x-ray. Patient has no peritonitis.  I do not think CT angiogram or CT abdomen pelvis is necessary unless patient has profound hemoglobin loss or she has multiple bloody stools here.  I have reviewed the CT and MRI patient had in August.  She was found to have findings that were consistent with colitis.  Subsequent colonoscopy did not reveal any ischemia.  Additional history obtained: Additional history obtained from spouse Records reviewed previous admission documents and GI note, cardiology note and recent scopes.  The scopes revealed gastritis, duodenitis but nonbleeding lesions.  Also multiple polyps was removed from colonoscopy.  Independent labs interpretation:  The following labs were independently interpreted: Hemoglobin is at 8, which is baseline for the  patient.  BUN is in the 20s, further lowering the suspicion for acute  upper GI bleed. Patient however does have increasing creatinine, increasing potassium. Likely cardiorenal process.  IV Lasix  will help potassium and the volume overload.  We will give her calcium  as she also has hypocalcemia.   Will need admission for optimization.  Independent visualization and interpretation of imaging: - I independently visualized the following imaging with scope of interpretation limited to determining acute life threatening conditions related to emergency care: X-ray of the chest, which revealed some evidence of interstitial edema.  Treatment and Reassessment: Results of the ED workup discussed with the patient.  She is aware that she will need admission.  Consultation: - Consulted or discussed management/test interpretation with external professional: GI.  Nurse practitioner from GI team will see the patient  I did consider CT abdomen and pelvis, but do not think it will be useful in the situation. C. difficile colitis is low in the differential, but we will get stool studies given patient indicating multiple bowel movements.  Final diagnoses:  Acute systolic congestive heart failure (HCC)  AKI (acute kidney injury) (HCC)  Cardiorenal syndrome with renal failure, stage 1-4 or unspecified chronic kidney disease, with heart failure [facility])  Melena    ED Discharge Orders     None          Charlyn Sora, MD 02/15/24 1449

## 2024-02-15 NOTE — ED Triage Notes (Signed)
 Pt c/o abdominal pain with bloody diarrhea x 4 days

## 2024-02-15 NOTE — Consult Note (Signed)
 Gastroenterology Consult   Referring Provider: Zelda Stephenson ED Primary Care Physician:  Amber Lowers, MD Primary Gastroenterologist:  Dr. Shaaron   Patient ID: Amber Stephenson; [id number]; 1945-08-12   Admit date: 02/15/2024  LOS: 0 days   Date of Consultation: 02/15/2024  Reason for Consultation:  GI bleeding  History of Present Illness   Amber Stephenson is a 78 y.o. year old female with a history of endometrial cancer medically inoperable, s/p IMRT, Afib on Eliquis , bipolar 1 disorder, CAD, CHF, CKD, chronic pain, DM, HTN, Herpes, NAFLD, OSA, osteopenia, RLS, pulmonary hypertension, microscopic colitis in 2018, diverticulitis on imaging May 2025, chronic constipation, GERD, IDA, GI bleed in Aug 2025 hospitalized with findings of PUD on EGD, multiple colonic AVMs, presenting to the ED today with concerns for lower GI bleeding, worsening diarrhea from baseline, abdominal bloating and distension after visit with PCP as outpatient. GI consulted due to rectal bleeding.   In the ED: Hgb 8 (was 7.2 a week prior), sodium 130, potassium 5.7, creatinine 1.3,albumin  3.1, alk phos 127, BNP 664 . CXR with cardiomegaly, vascular congestion, interstitial edema as prior. No LLE DVT on US . Repeat CBC has been requested.   At time of consultation:  Patient in ED with husband, Amber Stephenson, at bedside. Notes her main concern for presenting to the ED was diarrhea, onset a week ago, 12 watery stools a day. Not significant foul odor. No appetite. No N/V. Had chills, no fever. Used to have pain moreso LLQ, but now pain is RLQ, intermittent in nature, no known relieving or precipitating factors. Notes bright red blood and black stool with wiping and in commode but states this is better than it has been in the past when last hospitalized and diarrhea was what concerned her. Started iron Saturday. Stool dark prior to this. Intermittent with bleeding.   No oral NSAIDs. Has been taking PPI BID. Exposure to abx early  August, taking around 3-4 weeks' worth as trying to find best treatment for UTI. Baseline bowel habits soft stool, sometimes alternating with constipation. Diarrhea is not her baseline. States she has gone up in weight about 10-15 lbs. Lower extremity edema, right greater than left. Notes abdomen is swollen and tight, left side feels more protuberant than right.   Last dose of Eliquis  evening of 9/14. Uses topical diclofenac  gel.   Feels short of breath, on 3 liters nasal cannula. She is not typically on this during the day and uses prn at night.   Celiac serologies negative recently. Feritin 30, iron low at 21, iron sats low at 8. Hgb 7.2 as outpatient on 9/9.    EGD 01/22/24: - Normal esophagus.  - Non-bleeding gastric ulcers with a clean ulcer base (Forrest Class III). Biopsied.  - Non-bleeding duodenal diverticulum.  - Use pantoprazole  40 mg twice daily - Avoid NSAIDs - Path negative for H. pylori.  Superficial ulceration and surface erosion present.   Colonoscopy 01/22/2024: - Two 3 to 4 mm polyps in the transverse colon (sessile serrated polyp without dysplasia - Multiple non-bleeding colonic angiodysplastic lesions s/p APC - Diverticulosis in the sigmoid colon.  - The distal rectum and anal verge are normal on retroflexion view.  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   Benign paroxysmal positional vertigo due to bilateral vestibular disorder 05/20/2019   Bipolar 1 disorder (HCC) 01/23/2015   with GAD, benzo dependence   CAD (coronary artery  disease)    Nonobstructive; Managed by Dr. Charls   Cardiomegaly 01/12/2018   CHF (congestive heart failure) 06/08/2022   Chronic back pain 01/04/2015   Chronic constipation 04/25/2020   Chronic diastolic heart failure (HCC) 05/30/2015   Chronic pain syndrome 08/22/2019   back pain, sacroiliitis   Chronic post-traumatic stress disorder (PTSD) 12/06/2020   Diabetic neuropathy (HCC) 02/06/2016   Dyslipidemia 09/24/2020   Essential hypertension    Functional diarrhea 10/26/2020   Herpes genitalis in women 07/16/2015   History of adenomatous polyp of colon 05/21/2019   Overview:   03/31/17: Colonoscopy: nonadvanced adenoma, microscopic colitis, f/u 5 yrs, [facility]/GAP   History of radiation therapy    Endometrial- 02/18/23-03/31/23-Dr. Lynwood Stephenson   Hypotension 09/01/2022   Insomnia 01/23/2015   Metabolic acidosis 01/12/2023   Migraine headache with aura 02/12/2016   Myofascial pain dysfunction syndrome 08/22/2019   Non-alcoholic fatty liver disease 01/12/2018   OSA (obstructive sleep apnea) 02/24/2019   10/09/2018 - HST  - AHI 40.6    Osteopenia 12/06/2020   Rx alendronate  35 mg.   Pinguecula 12/06/2020   Presbyopia 12/06/2020   Pulmonary hypertension    RLS (restless legs syndrome) 04/27/2015   RSV (respiratory syncytial virus infection) 06/10/2023   Tremor, essential 12/11/2021   Type II diabetes mellitus (HCC) 09/24/2020    Past Surgical History:  Procedure Laterality Date   BREAST REDUCTION SURGERY     COLONOSCOPY  2018   6 mm cecal tubular adenoma, sigmoid diverticulosis, random colon biopsies consistent with microscopic colitis   COLONOSCOPY N/A 01/22/2024   Procedure: COLONOSCOPY;  Surgeon: Amber Stephenson Sieving, MD;  Location: [facility];  Service: Gastroenterology;  Laterality: N/A;   ESOPHAGOGASTRODUODENOSCOPY N/A 01/22/2024   Procedure: EGD (ESOPHAGOGASTRODUODENOSCOPY);  Surgeon: Amber Stephenson, Sieving, MD;  Location: [facility];  Service:  Gastroenterology;  Laterality: N/A;   EYE SURGERY Right    cateracts   HAMMER TOE SURGERY     LEFT HEART CATH AND CORONARY ANGIOGRAPHY N/A 02/03/2018   Procedure: LEFT HEART CATH AND CORONARY ANGIOGRAPHY;  Surgeon: Amber Stephenson, Amber M, MD;  Location: [facility] INVASIVE CV LAB;  Service: Cardiovascular;  Laterality: N/A;   REVERSE SHOULDER ARTHROPLASTY Right 08/19/2019   Procedure: REVERSE SHOULDER ARTHROPLASTY;  Surgeon: Kay Kemps, MD;  Location: [facility];  Service: Orthopedics;  Laterality: Right;  interscalene block   SHOULDER SURGERY Right    I BROKE MY SHOUDLER   THIGH SURGERY     TO REMOVE A TUMOR     Prior to Admission medications   Medication Sig Start Date End Date Taking? Authorizing Provider  acetaminophen  (TYLENOL ) 325 MG tablet Take 2 tablets (650 mg total) by mouth every 6 (six) hours as needed for mild pain (pain score 1-3) or fever (or Fever >/= 101). 01/23/24   Emokpae, Courage, MD  ALPRAZolam  (XANAX ) 1 MG tablet Take 1 mg by mouth at bedtime as needed for anxiety or sleep. 12/21/23   [provider]  ARIPiprazole  (ABILIFY ) 2 MG tablet Take 1 tablet (2 mg total) by mouth at bedtime. 01/23/24   Pearlean Manus, MD  atorvastatin  (LIPITOR) 80 MG tablet Take 1 tablet (80 mg total) by mouth daily. 01/23/24 04/22/24  Pearlean Manus, MD  carbidopa -levodopa  (SINEMET ) 10-100 MG tablet Take 1 tablet by mouth 4 (four) times daily. For restless leg syndrome 01/23/24   Pearlean Manus, MD  colchicine  0.6 MG tablet Take 1 tablet (0.6 mg total) by mouth 2 (two) times daily. For mouth sores 01/07/24   Amber Lowers, MD  Continuous Glucose Sensor (DEXCOM G7 SENSOR) MISC CHANGE SENSOR EVERY 10 DAYS 09/28/23   Shamleffer, Donell Cardinal, MD  cyanocobalamin  (VITAMIN B12) 1000 MCG tablet Take 1 tablet (1,000 mcg total) by mouth daily. 09/04/22   Ghimire, Donalda HERO, MD  diphenoxylate -atropine  (LOMOTIL ) 2.5-0.025 MG tablet TAKE 2 TABLETS BY MOUTH FOUR TIMES DAILY AS NEEDED FOR DIARRHEA OR LOOSE  STOOLS 11/24/23   Amber Lowers, MD  ELIQUIS  5 MG TABS tablet TAKE 1 TABLET TWICE A DAY 11/02/23   Shlomo Wilbert SAUNDERS, MD  escitalopram  (LEXAPRO ) 10 MG tablet Take 10 mg by mouth daily.    [provider]  furosemide  (LASIX ) 40 MG tablet Take 1 tablet (40 mg total) by mouth daily. 01/23/24   Pearlean Manus, MD  gabapentin  (NEURONTIN ) 100 MG capsule Take 1 capsule (100 mg total) by mouth 3 (three) times daily. 01/23/24   Pearlean Manus, MD  hydrOXYzine  (VISTARIL ) 25 MG capsule Take 1 capsule (25 mg total) by mouth every 8 (eight) hours as needed for anxiety. 01/23/24   Pearlean Manus, MD  insulin  glargine (LANTUS  SOLOSTAR) 100 UNIT/ML Solostar Pen Inject 10 Units into the skin at bedtime. 01/23/24   Pearlean Manus, MD  insulin  lispro (HUMALOG  KWIKPEN) 100 UNIT/ML KwikPen Inject 0-10 Units into the skin 2 (two) times daily before a meal. Sliding scale Short acting Humalog  insulin  per sliding scale 0-10 ---:-- Insulin  injection 0-10 Units 0-10 Units Subcutaneous, 3 times daily with meals CBG 70 - 120: 0 unit CBG 121 - 150: 0 unit  CBG 151 - 200: 1 unit CBG 201 - 250: 2 units CBG 251 - 300: 4 units CBG 301 - 350: 6 units  CBG 351 - 400: 8 units  CBG > 400: 10 units 01/23/24   Emokpae, Courage, MD  Insulin  Pen Needle 29G X MISC 1 Device by Does not apply route daily in the afternoon. 05/22/23   Shamleffer, Ibtehal Jaralla, MD  losartan  (COZAAR ) 25 MG tablet Take 25 mg by mouth daily. 02/14/23   [provider]  magic mouthwash (nystatin , lidocaine , diphenhydrAMINE ) suspension Take 5 mLs by mouth 3 (three) times daily as needed for mouth pain. 01/28/24   Amber Lowers, MD  MAGNESIUM  PO Take 1 tablet by mouth daily.    [provider]  metFORMIN  (GLUCOPHAGE -XR) 500 MG 24 hr tablet Take 2 tablets (1,000 mg total) by mouth daily with breakfast. 01/23/24   Pearlean Manus, MD  Methocarbamol  1000 MG TABS Take 1,000 mg by mouth every 6 (six) hours as needed. 08/31/23   Lovorn, Megan, MD   metoprolol  tartrate (LOPRESSOR ) 50 MG tablet Take 1 tablet (50 mg total) by mouth 2 (two) times daily. 01/23/24   Pearlean Manus, MD  Misc. Devices (3-IN-1 BEDSIDE TOILET) MISC Length of need 99 months M17.11 Osteoarthritis right knee / multiple falls / high risk fall 02/08/24   Margrette Taft BRAVO, MD  omeprazole  (PRILOSEC) 40 MG capsule Take 40 mg by mouth 2 (two) times daily. 01/29/24   [provider]  ondansetron  (ZOFRAN ) 4 MG tablet Take 1 tablet (4 mg total) by mouth every 8 (eight) hours as needed for nausea or vomiting. For cancer related nausea 01/01/24   Dettinger,  Fonda LABOR, MD  OXcarbazepine  (TRILEPTAL ) 150 MG tablet Take 1 tablet (150 mg total) by mouth 2 (two) times daily. 06/13/22   Sherrill Cable Latif, DO  oxyCODONE  (OXYCONTIN ) 20 mg 12 hr tablet Take 1 tablet (20 mg total) by mouth every 12 (twelve) hours. G89.3- chronic pain- fill 28 days after 8/25- Rx 01/08/24   Lovorn, Megan, MD  pantoprazole  (PROTONIX ) 40 MG tablet Take 1 tablet (40 mg total) by mouth 2 (two) times daily. 01/23/24   Pearlean Manus, MD  potassium chloride  (KLOR-CON ) 10 MEQ tablet Take 1 tablet (10 mEq total) by mouth daily. 01/29/24   Shlomo Wilbert SAUNDERS, MD  rOPINIRole  (REQUIP ) 2 MG tablet Take 1 tablet (2 mg total) by mouth at bedtime. 02/09/24   Amber Lowers, MD  tirzepatide  (MOUNJARO ) 12.5 MG/0.5ML Pen Inject 12.5 mg into the skin once a week. 09/21/23   Shamleffer, Donell Cardinal, MD  Vitamin D , Cholecalciferol , 25 MCG (1000 UT) TABS Take 1,000 Units by mouth in the morning. 07/26/20   [provider]    Current Facility-Administered Medications  Medication Dose Route Frequency Provider Last Rate Last Admin   calcium  gluconate inj 10% (1 g) URGENT USE ONLY!  1 g Intravenous Once Nanavati, Ankit, MD       denosumab  (PROLIA ) injection 60 mg  60 mg Subcutaneous Q6 months Amber Lowers, MD   60 mg at 01/15/24 1459   furosemide  (LASIX ) injection 40 mg  40 mg Intravenous Once Nanavati, Ankit, MD        loperamide  (IMODIUM ) capsule 4 mg  4 mg Oral Once Nanavati, Ankit, MD       pantoprazole  (PROTONIX ) injection 80 mg  80 mg Intravenous Once Nanavati, Ankit, MD       sodium zirconium cyclosilicate  (LOKELMA ) packet 10 g  10 g Oral Once Charlyn Sora, MD       Current Outpatient Medications  Medication Sig Dispense Refill   acetaminophen  (TYLENOL ) 325 MG tablet Take 2 tablets (650 mg total) by mouth every 6 (six) hours as needed for mild pain (pain score 1-3) or fever (or Fever >/= 101).     ALPRAZolam  (XANAX ) 1 MG tablet Take 1 mg by mouth at bedtime as needed for anxiety or sleep.     ARIPiprazole  (ABILIFY ) 2 MG tablet Take 1 tablet (2 mg total) by mouth at bedtime. 30 tablet 5   atorvastatin  (LIPITOR) 80 MG tablet Take 1 tablet (80 mg total) by mouth daily. 90 tablet 3   carbidopa -levodopa  (SINEMET ) 10-100 MG tablet Take 1 tablet by mouth 4 (four) times daily. For restless leg syndrome 120 tablet 5   colchicine  0.6 MG tablet Take 1 tablet (0.6 mg total) by mouth 2 (two) times daily. For mouth sores 60 tablet 1   Continuous Glucose Sensor (DEXCOM G7 SENSOR) MISC CHANGE SENSOR EVERY 10 DAYS 9 each 3   cyanocobalamin  (VITAMIN B12) 1000 MCG tablet Take 1 tablet (1,000 mcg total) by mouth daily. 30 tablet 1   diphenoxylate -atropine  (LOMOTIL ) 2.5-0.025 MG tablet TAKE 2 TABLETS BY MOUTH FOUR TIMES DAILY AS NEEDED FOR DIARRHEA OR LOOSE STOOLS 30 tablet 5   ELIQUIS  5 MG TABS tablet TAKE 1 TABLET TWICE A DAY 180 tablet 3   escitalopram  (LEXAPRO ) 10 MG tablet Take 10 mg by mouth daily.     furosemide  (LASIX ) 40 MG tablet Take 1 tablet (40 mg total) by mouth daily. 30 tablet 3   gabapentin  (NEURONTIN ) 100 MG capsule Take 1 capsule (100 mg total) by mouth 3 (three)  times daily. 90 capsule 3   hydrOXYzine  (VISTARIL ) 25 MG capsule Take 1 capsule (25 mg total) by mouth every 8 (eight) hours as needed for anxiety. 30 capsule 5   insulin  glargine (LANTUS  SOLOSTAR) 100 UNIT/ML Solostar Pen Inject 10 Units into  the skin at bedtime. 9 mL 0   insulin  lispro (HUMALOG  KWIKPEN) 100 UNIT/ML KwikPen Inject 0-10 Units into the skin 2 (two) times daily before a meal. Sliding scale Short acting Humalog  insulin  per sliding scale 0-10 ---:-- Insulin  injection 0-10 Units 0-10 Units Subcutaneous, 3 times daily with meals CBG 70 - 120: 0 unit CBG 121 - 150: 0 unit  CBG 151 - 200: 1 unit CBG 201 - 250: 2 units CBG 251 - 300: 4 units CBG 301 - 350: 6 units  CBG 351 - 400: 8 units  CBG > 400: 10 units 18 mL 1   Insulin  Pen Needle 29G X MISC 1 Device by Does not apply route daily in the afternoon. 400 each 3   losartan  (COZAAR ) 25 MG tablet Take 25 mg by mouth daily.     magic mouthwash (nystatin , lidocaine , diphenhydrAMINE ) suspension Take 5 mLs by mouth 3 (three) times daily as needed for mouth pain. 180 mL 0   MAGNESIUM  PO Take 1 tablet by mouth daily.     metFORMIN  (GLUCOPHAGE -XR) 500 MG 24 hr tablet Take 2 tablets (1,000 mg total) by mouth daily with breakfast. 90 tablet 3   Methocarbamol  1000 MG TABS Take 1,000 mg by mouth every 6 (six) hours as needed. 120 tablet 5   metoprolol  tartrate (LOPRESSOR ) 50 MG tablet Take 1 tablet (50 mg total) by mouth 2 (two) times daily. 180 tablet 3   Misc. Devices (3-IN-1 BEDSIDE TOILET) MISC Length of need 99 months M17.11 Osteoarthritis right knee / multiple falls / high risk fall 1 each 1   omeprazole  (PRILOSEC) 40 MG capsule Take 40 mg by mouth 2 (two) times daily.     ondansetron  (ZOFRAN ) 4 MG tablet Take 1 tablet (4 mg total) by mouth every 8 (eight) hours as needed for nausea or vomiting. For cancer related nausea 30 tablet 1   OXcarbazepine  (TRILEPTAL ) 150 MG tablet Take 1 tablet (150 mg total) by mouth 2 (two) times daily. 60 tablet 0   oxyCODONE  (OXYCONTIN ) 20 mg 12 hr tablet Take 1 tablet (20 mg total) by mouth every 12 (twelve) hours. G89.3- chronic pain- fill 28 days after 8/25- Rx 60 tablet 0   pantoprazole  (PROTONIX ) 40 MG tablet Take 1 tablet (40 mg total) by mouth 2  (two) times daily. 60 tablet 5   potassium chloride  (KLOR-CON ) 10 MEQ tablet Take 1 tablet (10 mEq total) by mouth daily. 90 tablet 1   rOPINIRole  (REQUIP ) 2 MG tablet Take 1 tablet (2 mg total) by mouth at bedtime. 90 tablet 0   tirzepatide  (MOUNJARO ) 12.5 MG/0.5ML Pen Inject 12.5 mg into the skin once a week. 6 mL 3   Vitamin D , Cholecalciferol , 25 MCG (1000 UT) TABS Take 1,000 Units by mouth in the morning.     Facility-Administered Medications Ordered in Other Encounters  Medication Dose Route Frequency Provider Last Rate Last Admin   bupivacaine -epinephrine  (MARCAINE  W/ EPI) 0.5% -1:200000 injection    Anesthesia Intra-op Hollis, Kevin D, MD   12 mL at 01/21/19 0935    Allergies as of 02/15/2024 - Review Complete 02/15/2024  Allergen Reaction Noted   Iodinated contrast media Anaphylaxis, Swelling, and Other (See Comments) 12/13/2014   Iodine Anaphylaxis  06/28/2003   Latex Other (See Comments) 09/29/2018   Tape Other (See Comments) 01/28/2023   Tizanidine  Other (See Comments) 03/10/2022    Family History  Problem Relation Age of Onset   Diabetes Mother    Heart disease Mother    Alzheimer's disease Father    Heart disease Sister        CABG   Diabetes Sister    Alcohol  abuse Sister    Stroke Brother    Heart disease Brother    Mental illness Brother    Diabetes Brother    Breast cancer Neg Hx    Ovarian cancer Neg Hx    Colon cancer Neg Hx    Endometrial cancer Neg Hx     Social History   Socioeconomic History   Marital status: Married    Spouse name: Financial planner   Number of children: 3   Years of education: 15   Highest education level: Associate degree: academic program  Occupational History   Occupation: Retired    Comment: Chief Financial Officer  Tobacco Use   Smoking status: Never   Smokeless tobacco: Never  Vaping Use   Vaping status: Never Used  Substance and Sexual Activity   Alcohol  use: Not Currently    Alcohol /week: 0.0 standard drinks of alcohol    Drug use: No    Sexual activity: Not Currently    Partners: Male    Birth control/protection: Post-menopausal  Other Topics Concern   Not on file  Social History Narrative   Lives at home with husband.    They have 3 children who live away - [place] , [place] , and [place] .   She is from [place]  and most of her family lives there.   Her husbands's family live nearby   Right handed.   Social Drivers of Corporate investment banker Strain: Low Risk  (08/13/2023)   Overall Financial Resource Strain (CARDIA)    Difficulty of Paying Living Expenses: Not hard at all  Food Insecurity: No Food Insecurity (01/19/2024)   Hunger Vital Sign    Worried About Running Out of Food in the Last Year: Never true    Ran Out of Food in the Last Year: Never true  Transportation Needs: No Transportation Needs (01/19/2024)   PRAPARE - Administrator, Civil Service (Medical): No    Lack of Transportation (Non-Medical): No  Physical Activity: Insufficiently Active (08/13/2023)   Exercise Vital Sign    Days of Exercise per Week: 3 days    Minutes of Exercise per Session: 20 min  Stress: No Stress Concern Present (08/13/2023)   [employer] of Occupational Health - Occupational Stress Questionnaire    Feeling of Stress : Only a little  Social Connections: Moderately Integrated (01/19/2024)   Social Connection and Isolation Panel    Frequency of Communication with Friends and Family: More than three times a week    Frequency of Social Gatherings with Friends and Family: Once a week    Attends Religious Services: More than 4 times per year    Active Member of [employer] or Organizations: Patient unable to answer    Attends Banker Meetings: Never    Marital Status: Married  Catering manager Violence: Not At Risk (01/19/2024)   Humiliation, Afraid, Rape, and Kick questionnaire    Fear of Current or Ex-Partner: No    Emotionally Abused: No    Physically Abused: No    Sexually Abused: No      Review of Systems   See HPI  Physical Exam   Vital Signs in last 24 hours: Temp:  [97.8 F (36.6 C)] 97.8 F (36.6 C) (09/15 0943) Pulse Rate:  [73-81] 78 (09/15 1230) Resp:  [14-20] 17 (09/15 1230) BP: (127-151)/(77-97) 151/96 (09/15 1230) SpO2:  [94 %-100 %] 100 % (09/15 1230) Weight:  [84.4 kg] 84.4 kg (09/15 1123)    General:   Alert,  chronically ill-appearing, frail Head:  Normocephalic and atraumatic. Eyes:  Sclera clear, no icterus.   C Ears:  Normal auditory acuity. Lungs:  no wheezing, coarse bilaterally, increased respiratory effort with use of accessory muscles, on 3 liters nasal cannula Heart:  irregularly irregular Abdomen:  Obese, distended upper abdomen, bilateral lower abdomen with anasarca, prominent stretch marks edematous, bowel sounds present, significantly more tender RLQ than LLQ Rectal: deferred   Extremities:  pedal and ankle edema, lower extremity edema 1+, left lower extremity slightly greater than right Neurologic:  Alert and  oriented x4. Skin:  abrasion over left eye Psych:  Alert and cooperative. Normal mood and affect.  Intake/Output from previous day: No intake/output data recorded. Intake/Output this shift: No intake/output data recorded.   Labs/Studies   Recent Labs Recent Labs    02/15/24 1334  WBC 6.5  HGB 8.0*  HCT 26.3*  PLT 158   BMET Recent Labs    02/15/24 1334  NA 130*  K 5.7*  CL 102  CO2 20*  GLUCOSE 106*  BUN 23  CREATININE 1.30*  CALCIUM  7.5*   LFT Recent Labs    02/15/24 1334  PROT 6.4*  ALBUMIN  3.1*  AST 21  ALT 12  ALKPHOS 127*  BILITOT 1.0     Radiology/Studies DG Chest Port 1 View Result Date: 02/15/2024 CLINICAL DATA:  Pain EXAM: PORTABLE CHEST 1 VIEW COMPARISON:  01/21/2024 FINDINGS: Cardiomegaly, vascular congestion. Interstitial prominence may reflect interstitial edema. No effusions or acute bony abnormality. IMPRESSION: Continued interstitial edema pattern, not significantly  changed since prior study. Electronically Signed   By: Franky Crease Stephenson.D.   On: 02/15/2024 13:44   US  Venous Img Lower Unilateral Left Result Date: 02/15/2024 CLINICAL DATA:  Left lower extremity edema EXAM: LEFT LOWER EXTREMITY VENOUS DOPPLER ULTRASOUND TECHNIQUE: Gray-scale sonography with compression, as well as color and duplex ultrasound, were performed to evaluate the deep venous system(s) from the level of the common femoral vein through the popliteal and proximal calf veins. COMPARISON:  None Available. FINDINGS: VENOUS Normal compressibility of the common femoral, superficial femoral, and popliteal veins, as well as the visualized calf veins. Visualized portions of profunda femoral vein and great saphenous vein unremarkable. No filling defects to suggest DVT on grayscale or color Doppler imaging. Doppler waveforms show normal direction of venous flow, normal respiratory plasticity and response to augmentation. Limited views of the contralateral common femoral vein are unremarkable. OTHER Superficial subcutaneous edema along the left calf. Limitations: none IMPRESSION: 1. No evidence of acute DVT. 2. Superficial subcutaneous edema may reflect volume overload or cellulitis. Electronically Signed   By: Wilkie Lent Stephenson.D.   On: 02/15/2024 13:32     Assessment   KAELI NICHELSON is a 78 y.o. year old female  with a history of endometrial cancer medically inoperable, s/p IMRT, Afib on Eliquis , bipolar 1 disorder, CAD, CHF, CKD, chronic pain, DM, HTN, Herpes, NAFLD, OSA, osteopenia, RLS, pulmonary hypertension, microscopic colitis in 2018, diverticulitis on imaging May 2025, chronic constipation, GERD, IDA, GI bleed in Aug 2025 hospitalized with findings of PUD on EGD, multiple colonic AVMs, presenting to the  ED today with concerns for lower GI bleeding, worsening diarrhea from baseline, abdominal bloating and distension after visit with PCP as outpatient, and now GI consulted for further management.    Diarrhea: notably, patient's baseline is soft stool alternating with constipation. Concern for infectious process due to numerous watery stools for the past week and recent prolonged exposure to abx. Need to rule out Cdiff. Hx of microscopic colitis in 2018. If stool studies are negative, could consider course of budesonide  if no other acute etiologies.  Abdominal pain, distension, RLQ abdominal pain: worsened from baseline and obvious anasarca on exam. Significant TTP RLQ, which is new to her from previously, as historically she had LLQ abdominal pain. Will order CT abd/pelvis without contrast due to findings on physical exam.   GI bleeding: interestingly, patient notes this is better than previously, endorsing intermittent bright red blood and melena with diarrhea but not with each episode. In setting of Eliquis , she has multiple sources for GI bleeding including known PUD, hx of colonic angiectasias, increased bleeding from internal hemorrhoids in setting acute onset of diarrhea, etc. I note Hgb was 8 on presentation today, but Hgb 7.2 a week prior. I suspect Hgb is hemo-concentrated and repeat H/H is pending: suspect may need transfusion. Needs to be resuscitated from a cardiopulmonary standpoint, rehydrated, ECHO updated prior to consideration for colonoscopy/EGD. Anticipate this could be later in week if has significant bleeding.   Agree with ECHO for tomorrow, as she has decompensated from her baseline.   Plan / Recommendations    Check Cdiff, GI path panel CT abd/pelvis without IV contrast PPI BID for PUD Follow H/H, transfuse if Hgb less than 7 Holding off on colonoscopy/EGD until improved from cardiopulmonary standpoint: may be able to perform this later in admission and if no significant bleeding, could stay with plans for outpatient EGD Continue to hold Eliquis  Agree with updating ECHO: depending on findings, may not be a candidate for anesthesia at [place] if has decompensated  from her baseline Will reassess in the morning     02/15/2024, 2:20 PM  Amber MICAEL Stager, PhD, ANP-BC [facility]

## 2024-02-16 ENCOUNTER — Ambulatory Visit (HOSPITAL_COMMUNITY)

## 2024-02-16 DIAGNOSIS — Z8719 Personal history of other diseases of the digestive system: Secondary | ICD-10-CM

## 2024-02-16 DIAGNOSIS — R140 Abdominal distension (gaseous): Secondary | ICD-10-CM | POA: Diagnosis not present

## 2024-02-16 DIAGNOSIS — I130 Hypertensive heart and chronic kidney disease with heart failure and stage 1 through stage 4 chronic kidney disease, or unspecified chronic kidney disease: Secondary | ICD-10-CM | POA: Diagnosis not present

## 2024-02-16 DIAGNOSIS — K921 Melena: Secondary | ICD-10-CM | POA: Diagnosis not present

## 2024-02-16 DIAGNOSIS — I5033 Acute on chronic diastolic (congestive) heart failure: Secondary | ICD-10-CM | POA: Diagnosis not present

## 2024-02-16 DIAGNOSIS — N179 Acute kidney failure, unspecified: Secondary | ICD-10-CM | POA: Diagnosis not present

## 2024-02-16 DIAGNOSIS — R1031 Right lower quadrant pain: Secondary | ICD-10-CM | POA: Diagnosis not present

## 2024-02-16 DIAGNOSIS — R197 Diarrhea, unspecified: Secondary | ICD-10-CM | POA: Diagnosis not present

## 2024-02-16 LAB — BASIC METABOLIC PANEL WITH GFR
Anion gap: 6 (ref 5–15)
BUN: 22 mg/dL (ref 8–23)
CO2: 23 mmol/L (ref 22–32)
Calcium: 7.4 mg/dL — ABNORMAL LOW (ref 8.9–10.3)
Chloride: 102 mmol/L (ref 98–111)
Creatinine, Ser: 1.22 mg/dL — ABNORMAL HIGH (ref 0.44–1.00)
GFR, Estimated: 46 mL/min — ABNORMAL LOW (ref >60)
Glucose, Bld: 112 mg/dL — ABNORMAL HIGH (ref 70–99)
Potassium: 5.1 mmol/L (ref 3.5–5.1)
Sodium: 131 mmol/L — ABNORMAL LOW (ref 135–145)

## 2024-02-16 LAB — GASTROINTESTINAL PANEL BY PCR, STOOL (REPLACES STOOL CULTURE)

## 2024-02-16 LAB — GLUCOSE, CAPILLARY
Glucose-Capillary: 111 mg/dL — ABNORMAL HIGH (ref 70–99)
Glucose-Capillary: 106 mg/dL — ABNORMAL HIGH (ref 70–99)
Glucose-Capillary: 117 mg/dL — ABNORMAL HIGH (ref 70–99)
Glucose-Capillary: 133 mg/dL — ABNORMAL HIGH (ref 70–99)

## 2024-02-16 LAB — CBC
HCT: 23.7 % — ABNORMAL LOW (ref 36.0–46.0)
Hemoglobin: 7.2 g/dL — ABNORMAL LOW (ref 12.0–15.0)
MCH: 25.7 pg — ABNORMAL LOW (ref 26.0–34.0)
MCHC: 30.4 g/dL (ref 30.0–36.0)
MCV: 84.6 fL (ref 80.0–100.0)
Platelets: 141 K/uL — ABNORMAL LOW (ref 150–400)
RBC: 2.8 MIL/uL — ABNORMAL LOW (ref 3.87–5.11)
RDW: 19.9 % — ABNORMAL HIGH (ref 11.5–15.5)
WBC: 4.9 K/uL (ref 4.0–10.5)
nRBC: 0 % (ref 0.0–0.2)

## 2024-02-16 LAB — HEMOGLOBIN A1C
Hgb A1c MFr Bld: 4.9 % (ref 4.8–5.6)
Mean Plasma Glucose: 93.93 mg/dL

## 2024-02-16 LAB — PREPARE RBC (CROSSMATCH)

## 2024-02-16 MED ORDER — SODIUM CHLORIDE 0.9% IV SOLUTION: Freq: Once | INTRAVENOUS | Status: AC

## 2024-02-16 MED ORDER — IPRATROPIUM-ALBUTEROL 0.5-2.5 (3) MG/3ML IN SOLN: 3.0000 mL | Freq: Three times a day (TID) | RESPIRATORY_TRACT | Status: DC | PRN

## 2024-02-16 MED ORDER — HYDROMORPHONE HCL 1 MG/ML IJ SOLN
0.5000 mg | Freq: Four times a day (QID) | INTRAMUSCULAR | Status: DC | PRN
  Administered 2024-02-17 – 2024-02-25 (×19): 0.5 mg via INTRAVENOUS
  Filled 2024-02-16 (×19): qty 0.5

## 2024-02-16 MED ORDER — OXYCODONE HCL ER 20 MG PO T12A: 20.0000 mg | EXTENDED_RELEASE_TABLET | Freq: Two times a day (BID) | ORAL | 0 refills | Status: DC

## 2024-02-16 MED ORDER — INFLUENZA VAC SPLIT HIGH-DOSE 0.5 ML IM SUSY
0.5000 mL | PREFILLED_SYRINGE | INTRAMUSCULAR | Status: AC
  Administered 2024-02-18: 0.5 mL via INTRAMUSCULAR
  Filled 2024-02-16: qty 0.5

## 2024-02-16 NOTE — Plan of Care (Signed)
   Problem: Coping: Goal: Ability to adjust to condition or change in health will improve Outcome: Progressing   Problem: Fluid Volume: Goal: Ability to maintain a balanced intake and output will improve Outcome: Progressing   Problem: Health Behavior/Discharge Planning: Goal: Ability to identify and utilize available resources and services will improve Outcome: Progressing

## 2024-02-16 NOTE — Progress Notes (Signed)
 Gastroenterology Progress Note   Referring Provider: No ref. provider found Primary Care Physician:  [name], MD Primary Gastroenterologist:  Lamar HERO.Rourk, MD  Patient ID: Amber Stephenson; [id number]; 13-Aug-1945    Subjective   Still having abdominal pain. No BM since yesterday evening with only mild tinge of blood - no diarrhea. No blood with trip to bathroom this morning. Tolerating full liquid diet withotu increased pain. Ongoing pain/tenderness to lower abdomen and right leg. Feeling more short of breath this morning and having some audible wheezing.   Notified patient and husband about CT findings.   Objective   Vital signs in last 24 hours Temp:  [97.4 F (36.3 C)-98 F (36.7 C)] 98 F (36.7 C) (09/16 0621) Pulse Rate:  [69-82] 76 (09/16 0835) Resp:  [14-20] 18 (09/16 0613) BP: (114-154)/(70-97) 142/79 (09/16 0835) SpO2:  [94 %-100 %] 100 % (09/16 0835) Weight:  [82.9 kg-84.4 kg] 82.9 kg (09/16 0500) Last BM Date : 02/14/24  Physical Exam General:   Alert and oriented, pleasant Head:  Normocephalic and atraumatic. Eyes:  No icterus, sclera clear. Conjuctiva pink.  Mouth:  Without lesions, mucosa pink and moist.  Neck:  Supple, without thyromegaly or masses.  Lungs: Mild wheezing noted bilaterally. Abdomen:  Bowel sounds present, non-distended, soft, TTP to right lower quadrant, left lower quadrant, and epigastrium.  No overt hernia on exam despite imaging (could be secondary to patient position in the bed ).  No rebound or guarding. Msk:  Symmetrical without gross deformities. Normal posture. Extremities:  2+/3+ pitting edema to bilateral lower extremities Neurologic:  Alert and  oriented x4. Skin:  Warm and dry, intact without significant lesions.  Psych:  Alert and cooperative. Normal mood and affect.  Intake/Output from previous day: 09/15 0701 - 09/16 0700 In: 3 [I.V.:3] Out: -  Intake/Output this shift: No intake/output data recorded.  Lab  Results  Recent Labs    02/15/24 1334 02/16/24 0412  WBC 6.5 4.9  HGB 8.0* 7.2*  HCT 26.3* 23.7*  PLT 158 141*   BMET Recent Labs    02/15/24 1334 02/16/24 0412  NA 130* 131*  K 5.7* 5.1  CL 102 102  CO2 20* 23  GLUCOSE 106* 112*  BUN 23 22  CREATININE 1.30* 1.22*  CALCIUM  7.5* 7.4*   LFT Recent Labs    02/15/24 1334  PROT 6.4*  ALBUMIN  3.1*  AST 21  ALT 12  ALKPHOS 127*  BILITOT 1.0   PT/INR No results for input(s): LABPROT, INR in the last 72 hours. Hepatitis Panel No results for input(s): HEPBSAG, HCVAB, HEPAIGM, HEPBIGM in the last 72 hours. C-Diff PCR negative  Studies/Results CT ABDOMEN PELVIS WO CONTRAST Result Date: 02/15/2024 CLINICAL DATA:  Right lower quadrant abdominal pain.  Diarrhea. EXAM: CT ABDOMEN AND PELVIS WITHOUT CONTRAST TECHNIQUE: Multidetector CT imaging of the abdomen and pelvis was performed following the standard protocol without IV contrast. RADIATION DOSE REDUCTION: This exam was performed according to the departmental dose-optimization program which includes automated exposure control, adjustment of the mA and/or kV according to patient size and/or use of iterative reconstruction technique. COMPARISON:  01/19/2024 FINDINGS: Lower chest: Substantial cardiomegaly. Low-density blood pool pool favoring anemia. Dense mitral valve calcification. Mild aortic valve calcification. Mosaic attenuation at the lung bases with some reticular interstitial accentuation, similar to the prior exam. Faint nodularity inferiorly in the right middle lobe likewise similar to the prior exam. Hepatobiliary: Unremarkable Pancreas: Unremarkable Spleen: Unremarkable Adrenals/Urinary Tract: Stable appearance of the adrenal glands and  kidneys, no hydronephrosis or hydroureter. Borderline urinary bladder wall thickening, cystitis not excluded, correlate with urine analysis. Stomach/Bowel: Proximal transverse duodenal diverticulum. Normal appendix. No dilated  bowel to indicate obstruction. Numerous jejunal diverticular present. None of these appear overtly inflamed. Equivocal wall thickening in the descending and sigmoid colon, much of the appearance may be from nondistention rather than necessarily from colitis/inflammation. Vascular/Lymphatic: Atherosclerosis is present, including aortoiliac atherosclerotic disease. Atheromatous plaque at the origins of the celiac trunk and SMA. Patency not assessed on today's noncontrast examination. No pathologic adenopathy observed. Reproductive: Unremarkable Other: Small amount of ascites mainly along the left paracolic gutter. Presacral edema. Musculoskeletal: Subcutaneous edema in the anterior pannus, cannot exclude panniculitis. Right groin hernia contains adipose tissue. Notable subcutaneous edema also present laterally along the right upper thigh. Rib deformities associated with healing left anterior fifth and sixth rib fractures. IMPRESSION: 1. Equivocal wall thickening in the descending and sigmoid colon, much of the appearance may be from nondistention rather than necessarily from colitis/inflammation. 2. Small amount of ascites mainly along the left paracolic gutter. 3. Subcutaneous edema in the anterior pannus, cannot exclude panniculitis. 4. Substantial cardiomegaly. Low-density blood pool favoring anemia. 5. Nonspecific mosaic attenuation at the lung bases with some reticular interstitial accentuation, similar to the prior exam. Faint nodularity inferiorly in the right middle lobe likewise similar to the prior exam. 6. Right groin hernia contains adipose tissue. 7. Notable subcutaneous edema laterally along the right upper thigh. 8. Healing left anterior fifth and sixth rib fractures. 9.  Aortic Atherosclerosis (ICD10-I70.0). Electronically Signed   By: Ryan Salvage M.D.   On: 02/15/2024 17:34   DG Chest Port 1 View Result Date: 02/15/2024 CLINICAL DATA:  Pain EXAM: PORTABLE CHEST 1 VIEW COMPARISON:  01/21/2024  FINDINGS: Cardiomegaly, vascular congestion. Interstitial prominence may reflect interstitial edema. No effusions or acute bony abnormality. IMPRESSION: Continued interstitial edema pattern, not significantly changed since prior study. Electronically Signed   By: Franky Crease M.D.   On: 02/15/2024 13:44   US  Venous Img Lower Unilateral Left Result Date: 02/15/2024 CLINICAL DATA:  Left lower extremity edema EXAM: LEFT LOWER EXTREMITY VENOUS DOPPLER ULTRASOUND TECHNIQUE: Gray-scale sonography with compression, as well as color and duplex ultrasound, were performed to evaluate the deep venous system(s) from the level of the common femoral vein through the popliteal and proximal calf veins. COMPARISON:  None Available. FINDINGS: VENOUS Normal compressibility of the common femoral, superficial femoral, and popliteal veins, as well as the visualized calf veins. Visualized portions of profunda femoral vein and great saphenous vein unremarkable. No filling defects to suggest DVT on grayscale or color Doppler imaging. Doppler waveforms show normal direction of venous flow, normal respiratory plasticity and response to augmentation. Limited views of the contralateral common femoral vein are unremarkable. OTHER Superficial subcutaneous edema along the left calf. Limitations: none IMPRESSION: 1. No evidence of acute DVT. 2. Superficial subcutaneous edema may reflect volume overload or cellulitis. Electronically Signed   By: Wilkie Lent M.D.   On: 02/15/2024 13:32   CT Knee Right Wo Contrast Result Date: 01/29/2024 CLINICAL DATA:  Knee trauma EXAM: CT OF THE RIGHT KNEE WITHOUT CONTRAST TECHNIQUE: Multidetector CT imaging of the right knee was performed according to the standard protocol. Multiplanar CT image reconstructions were also generated. RADIATION DOSE REDUCTION: This exam was performed according to the departmental dose-optimization program which includes automated exposure control, adjustment of the mA  and/or kV according to patient size and/or use of iterative reconstruction technique. COMPARISON:  01/29/2024 FINDINGS: Bones/Joint/Cartilage No  acute fracture or malalignment. Mild tricompartment arthritis of the knee with mild joint space narrowing and slight spurring. No sizable knee effusion. Ligaments Suboptimally assessed by CT. Muscles and Tendons No intramuscular fluid collections. Soft tissues Prominent soft tissue swelling anterior to the patella. Moderate circumferential edema extending from the distal thigh to the imaged lower leg. IMPRESSION: 1. No acute fracture or malalignment. 2. Prominent soft tissue swelling anterior to the patella. Moderate circumferential edema extending from the distal thigh to the imaged lower leg. 3. Mild tricompartment arthritis of the knee. Electronically Signed   By: Luke Bun M.D.   On: 01/29/2024 21:39   CT Cervical Spine Wo Contrast Result Date: 01/29/2024 CLINICAL DATA:  Trauma fall EXAM: CT CERVICAL SPINE WITHOUT CONTRAST TECHNIQUE: Multidetector CT imaging of the cervical spine was performed without intravenous contrast. Multiplanar CT image reconstructions were also generated. RADIATION DOSE REDUCTION: This exam was performed according to the departmental dose-optimization program which includes automated exposure control, adjustment of the mA and/or kV according to patient size and/or use of iterative reconstruction technique. COMPARISON:  CT 01/12/2024 FINDINGS: Alignment: Mild reversal of cervical lordosis. Facet alignment is within normal limits. Skull base and vertebrae: No acute fracture. No primary bone lesion or focal pathologic process. Soft tissues and spinal canal: No prevertebral fluid or swelling. No visible canal hematoma. Disc levels: Multilevel degenerative change. Mild to moderate diffuse disc space narrowing C4 through C7. Facet degenerative changes at multiple levels with foraminal narrowing. Upper chest: Negative. Other: None IMPRESSION:  Mild reversal of cervical lordosis with degenerative changes. No acute osseous abnormality. Electronically Signed   By: Luke Bun M.D.   On: 01/29/2024 21:23   CT Head Wo Contrast Result Date: 01/29/2024 CLINICAL DATA:  Head trauma fall EXAM: CT HEAD WITHOUT CONTRAST TECHNIQUE: Contiguous axial images were obtained from the base of the skull through the vertex without intravenous contrast. RADIATION DOSE REDUCTION: This exam was performed according to the departmental dose-optimization program which includes automated exposure control, adjustment of the mA and/or kV according to patient size and/or use of iterative reconstruction technique. COMPARISON:  CT brain 01/21/2024, 12/18/2023 FINDINGS: Brain: No acute territorial infarction, hemorrhage or intracranial mass. Mild atrophy. Mild patchy white matter hypodensity. Nonenlarged ventricles Vascular: No hyperdense vessels.  Carotid vascular calcification Skull: Normal. Negative for fracture or focal lesion. Sinuses/Orbits: No acute finding. Other: None IMPRESSION: 1. No CT evidence for acute intracranial abnormality. 2. Atrophy and mild chronic small vessel ischemic changes of the white matter. Electronically Signed   By: Luke Bun M.D.   On: 01/29/2024 21:02   DG Elbow Complete Right Result Date: 01/29/2024 CLINICAL DATA:  Fall, right elbow pain EXAM: RIGHT ELBOW - COMPLETE 3+ VIEW COMPARISON:  None Available. FINDINGS: There is no evidence of fracture, dislocation, or joint effusion. There is no evidence of arthropathy or other focal bone abnormality. Soft tissues are unremarkable. IMPRESSION: Negative. Electronically Signed   By: Dorethia Molt M.D.   On: 01/29/2024 20:23   DG Wrist Complete Right Result Date: 01/29/2024 CLINICAL DATA:  Fall, pain EXAM: RIGHT WRIST - COMPLETE 3+ VIEW COMPARISON:  None Available. FINDINGS: There is no evidence of fracture or dislocation. There is no evidence of arthropathy or other focal bone abnormality. There is  soft tissue swelling dorsal to the radiocarpal articulation. IMPRESSION: 1. Soft tissue swelling. No fracture or dislocation. Electronically Signed   By: Dorethia Molt M.D.   On: 01/29/2024 20:22   DG Knee Complete 4 Views Right Result Date: 01/29/2024 CLINICAL  DATA:  Right knee pain after fall today. EXAM: RIGHT KNEE - COMPLETE 4+ VIEW COMPARISON:  January 12, 2024. FINDINGS: No evidence of fracture, dislocation, or joint effusion. Mild narrowing of medial joint space is noted. Moderate prepatellar soft tissue swelling is noted suggesting traumatic injury. IMPRESSION: Moderate prepatellar soft tissue swelling is noted suggesting traumatic injury. No fracture or dislocation is noted. Electronically Signed   By: Lynwood Landy Raddle M.D.   On: 01/29/2024 16:43   CT HEAD WO CONTRAST ( ) Result Date: 01/21/2024 CLINICAL DATA:  Provided history: Mental status change, unknown cause. EXAM: CT HEAD WITHOUT CONTRAST TECHNIQUE: Contiguous axial images were obtained from the base of the skull through the vertex without intravenous contrast. RADIATION DOSE REDUCTION: This exam was performed according to the departmental dose-optimization program which includes automated exposure control, adjustment of the mA and/or kV according to patient size and/or use of iterative reconstruction technique. COMPARISON:  Head CT 01/12/2024. FINDINGS: Brain: Generalized cerebral atrophy. Patchy and ill-defined hypoattenuation within the cerebral white matter, nonspecific but compatible with mild-to-moderate chronic small vessel ischemic disease. There is no acute intracranial hemorrhage. No demarcated cortical infarct. No extra-axial fluid collection. No evidence of an intracranial mass. No midline shift. Vascular: No hyperdense vessel.  Atherosclerotic calcifications. Skull: No calvarial fracture or aggressive osseous lesion. Sinuses/Orbits: No mass or acute finding within the imaged orbits. No significant paranasal sinus disease at the  imaged levels. IMPRESSION: 1. No evidence of an acute intracranial abnormality. 2. Parenchymal atrophy and chronic small vessel ischemic disease. Electronically Signed   By: Rockey Childs D.O.   On: 01/21/2024 19:21   DG CHEST PORT 1 VIEW Result Date: 01/21/2024 CLINICAL DATA:  [id number] Acute respiratory failure with hypoxia (HCC) [id number] EXAM: PORTABLE CHEST - 1 VIEW COMPARISON:  December 30, 2023 FINDINGS: Lower lung volumes with bilateral perihilar interstitial opacities. Elevation of the right hemidiaphragm. No pneumothorax or pleural effusion. Moderate cardiomegaly. Aortic atherosclerosis. No acute fracture or destructive lesions. Multilevel thoracic osteophytosis. Right shoulder arthroplasty. IMPRESSION: Moderate cardiomegaly with findings suggestive of interstitial edema. Electronically Signed   By: Rogelia Myers M.D.   On: 01/21/2024 16:46   MR ABDOMEN W WO CONTRAST Result Date: 01/19/2024 CLINICAL DATA:  Acute onset watery diarrhea EXAM: MRI ABDOMEN WITHOUT AND WITH CONTRAST TECHNIQUE: Multiplanar multisequence MR imaging of the abdomen was performed both before and after the administration of intravenous contrast. CONTRAST:  8mL GADAVIST  GADOBUTROL  1 MMOL/ML IV SOLN COMPARISON:  CT abdomen pelvis January 19, 2024, same day MR pelvis. FINDINGS: Lower chest: Cardiomegaly. No pericardial no pleural fluid. Limited evaluation of the lungs on MRI. Hepatobiliary: Subcentimeter left hepatic lobe cyst . No significant hepatic steatosis. Non cirrhotic morphology. No hepatic biliary dilatation. Gallbladder is slightly distended with questionable sludge/tiny stones. Normal wall thickness. CBD is dilated up to 8 mm proximally and tapers to 4 mm at the pancreatic head. Pancreas: Atrophic changes of the pancreas. No significant pancreatic ductal dilatation or abnormal enhancement. Spleen:  Within normal limits. Adrenals/Urinary Tract: Punctate 4 mm left adrenal postcontrast filling defect may represent a tiny nodule  likely benign. Right kidney interpolar cortical T2 hyperintense cyst which does not require imaging follow-up. No hydronephrosis. Stomach/Bowel: Gaseous distension of the transverse and ascending colon with air-fluid level indicating diarrhea. No bowel obstruction, abnormal enhancement or bowel mass lesion. A few colonic diverticula in the descending colon without definite evidence of diverticulitis. Stomach appears unremarkable. Normal appendix. Vascular/Lymphatic: No suspicious lymphadenopathy. Vascular structures are unremarkable. Musculoskeletal: Scoliosis.  Degenerative changes of the spine.  IMPRESSION: Gas and fluid containing distended colon in keeping with history of diarrhea. Few colonic diverticula. No bowel mass , obstruction or wall thickening . Left hepatic lobe cyst. Possible gallbladder sludge. Cardiomegaly. Query punctate left adrenal nodule, too small to characterize. Electronically Signed   By: Megan  Zare M.D.   On: 01/19/2024 20:34   MR PELVIS W WO CONTRAST Result Date: 01/19/2024 CLINICAL DATA:  Severe diarrhea EXAM: MRI PELVIS WITHOUT AND WITH CONTRAST TECHNIQUE: Multiplanar multisequence MR imaging of the pelvis was performed both before and after administration of intravenous contrast. CONTRAST:  8mL GADAVIST  GADOBUTROL  1 MMOL/ML IV SOLN COMPARISON:  Same day CT abdomen pelvis January 19, 2024. FINDINGS: Urinary Tract:  Normal bladder. Bowel: Gas and fluid containing distended transverse colon consistent with known diarrhea. No suspicious bowel lesion within the field of few. Appendix is normal. Vascular/Lymphatic: No suspicious lymphadenopathy. Vascular structures are unremarkable. No aneurysm. Reproductive: Postmenopausal uterus measures 7.8 x 3.1 x 3.8 cm. Normal myometrium. Endometrial thickness measures 1.9 mm. Right ovary is 1.9 x 1 cm. Left ovary measures 1.7 x 1 cm. Other: Bilateral fat containing inguinal hernias right greater than left . Please refer to same day MRI abdomen for  further details of the visceral structures of the abdomen. Musculoskeletal: Subcutaneous T2 hyperintensity along the left anterior abdominal wall correlating to site of fat stranding on prior CT and suggestive of inflammatory changes. No significant fluid collection. Degenerative changes of the spine. IMPRESSION: No suspicious bowel lesion or obstruction. Dilated fluid gas and fluid containing transverse colon in keeping with known diarrhea state. Bilateral fat containing inguinal hernias, right greater than left. Mild inflammatory changes in subcutaneous left ventral abdominal wall. Electronically Signed   By: Megan  Zare M.D.   On: 01/19/2024 20:12   CT ABDOMEN PELVIS WO CONTRAST Result Date: 01/19/2024 CLINICAL DATA:  Abdominal pain, acute, nonlocalized EXAM: CT ABDOMEN AND PELVIS WITHOUT CONTRAST TECHNIQUE: Multidetector CT imaging of the abdomen and pelvis was performed following the standard protocol without IV contrast. RADIATION DOSE REDUCTION: This exam was performed according to the departmental dose-optimization program which includes automated exposure control, adjustment of the mA and/or kV according to patient size and/or use of iterative reconstruction technique. COMPARISON:  CT chest, abdomen, and pelvis dated 01/12/2024. FINDINGS: Lower chest: Chronic bibasilar subpleural reticulation and ground-glass changes. Similar patchy ground-glass nodular densities, most pronounced in the visualized right upper lobe, concerning for multifocal infection, better evaluated on the recent CT chest, abdomen common pelvis dated 01/12/2024. Cardiomegaly. Dense mitral calcification. Hepatobiliary: No suspicious focal hepatic lesion identified within the limits of an unenhanced exam. Gallbladder is unremarkable. No biliary dilatation. Pancreas: Unremarkable Spleen: Unremarkable Adrenals/Urinary Tract: Stable 1 cm left adrenal nodule, essentially unchanged dating back to 12/26/2019, most compatible with a benign  etiology and favored to represent an adenoma. The right adrenal gland is unremarkable. No urolithiasis or hydronephrosis. Bladder is unremarkable. Stomach/Bowel: Stomach is within normal limits. Appendix appears normal. No obstruction or focal inflammatory changes. Air-fluid levels within the colon, as can be seen in the setting of diarrheal illness. Vascular/Lymphatic: Aortic atherosclerosis. No enlarged abdominal or pelvic lymph nodes. Reproductive: Uterus and bilateral adnexa are unremarkable. Other: No abdominopelvic ascites. No intraperitoneal free air. Fat containing right inguinal hernia. Similar infiltration of the subcutaneous soft tissues of the ventral abdominal pannus. No loculated fluid collection. Musculoskeletal: No acute osseous abnormality. No suspicious osseous lesion. Mild chronic T9, T11, and L2 compression deformities. Degenerative disc changes of the thoracolumbar spine. IMPRESSION: 1. Air-fluid levels within the colon,  as can be seen in the setting of diarrheal illness. 2. Similar infiltration of the subcutaneous soft tissues of the ventral abdominal pannus. No loculated fluid collection. Cellulitis not excluded. 3. Chronic interstitial changes of the visualized lung bases with similar patchy ground-glass nodular densities, concerning for multifocal infection and better evaluated on the recent CT chest, abdomen and pelvis dated 01/12/2024. Aortic Atherosclerosis (ICD10-I70.0). Electronically Signed   By: Harrietta Sherry M.D.   On: 01/19/2024 12:55    Assessment  78 y.o. female with a history of endometrial cancer medically inoperable, s/p IMRT, Afib on Eliquis , bipolar 1 disorder, CAD, CHF, chronic pain, DM, HTN, Herpes, NAFLD, OSA, osteopenia, RLS, pulmonary hypertension, previous history of microscopic colitis, diverticulitis in May 2025 imaging, GERD, IDA, GI bleed in August 2025 secondary to colonic AVMs, and chronic constipation who presented to the ED 9/15 for concerns of worsening  diarrhea from baseline, bloating and distention as well as ongoing lower GI bleeding after follow-up with primary care.  GI consulted for further evaluation of rectal bleeding.  Diarrhea: C. difficile screen negative, GI panel pending.  Typically more constipated with soft stools however recently over the last week having increased watery stools.  Recent prolonged exposure antibiotics.  History of microscopic colitis in 2018. No evidence of inflammatory or infectious colitis noted on imaging.  No further diarrhea since yesterday afternoon, and no frequent bowel movements overnight or this morning, tolerating full liquid diet.  Advance to GI soft diet.  If return of diarrhea could consider course of budesonide  given history of microscopic colitis.  RLQ abdominal pain, distention: Obvious anasarca on clinical exam as well as imaging as noted below.  Continues to be tender to palpation.  No overt abnormality felt on examination.  Unclear if secondary to bowel habits, possible IBS.  For now continue PPI twice daily given history of peptic ulcer disease, this more so is likely cause of epigastric tenderness noted on exam.  CT A/P without contrast yesterday 9/15 with equivocal wall thickening of the descending sigmoid colon, most likely from nondistention rather than inflammation, small amount of ascites in the left paracolic gutter, subcutaneous edema in the anterior pannus unable to exclude panniculitis, substantial cardiomegaly, mosaic attenuation of the lung bases with reticular interstitial accentuation similar to prior with faint nodularity similar to prior exam, right groin hernia containing adipose tissue, subcutaneous edema laterally along right upper thigh, healing left 5th and 6th rib fractures.  Etiology of right lower quadrant pain and distention could be secondary to groin hernia. LLQ pain could be secondary to edema within the pannus of the left lower side.  No overt evidence of cellulitis today  on exam.  No evidence of colitis on imaging as above.  Received dose of Lasix  this morning with plan for another 40 mg this evening.  GI bleed: Previously with intermittent BRBPR and melena, now with diarrhea but no longer having bleeding with each episode of bowel movements.  Currently on Eliquis  and has known peptic ulcer disease and history of colonic angiectasia's as well as hemorrhoidal bleeding.  Hemoglobin 8 yesterday on admission, down to 7.2 today.  Hemoglobin prior to this was 7.2 on 9/9.  May require transfusion this admission.  Has not received any IV fluid administration.  Received dose of Lasix  this morning.  Needs updated repeat EGD, originally planned for November given known PUD, needs to be optimized from a cardiopulmonary standpoint and updating ECHO prior to proceeding.  Plan / Recommendations  Follow-up GI pathogen panel PPI BID  Advised diet as tolerated May consider budesonide  if stool studies negative. Pending cardiac eval to determine need for procedures Consider IV iron load while inpatient.     LOS: 1 day    02/16/2024, 10:41 AM   Charmaine Melia, MSN, FNP-BC, AGACNP-BC [facility]

## 2024-02-16 NOTE — TOC Initial Note (Signed)
 Transition of Care [facility]) - Initial/Assessment Note    Patient Details  Name: Amber Stephenson MRN: [id number] Date of Birth: 1945/08/14  [facility]) CM/SW Contact:    Lucie Lunger, LCSWA Phone Number: 02/16/2024, 11:17 AM  Clinical Narrative:                 Pt is high risk for readmission. CSW spoke with pt and spouse at bedside to complete assessment. Pt lives with her spouse. Pt is somewhat independent with her ADLs. Pts spouse provides transportation. Pt has a cane and walker in the home. Pt has had HH in the past but is now starting [facility] PT [facility]. TOC to follow.   Expected Discharge Plan: Home/Self Care Barriers to Discharge: Continued Medical Work up   Patient Goals and CMS Choice Patient states their goals for this hospitalization and ongoing recovery are:: return home CMS Medicare.gov Compare Post Acute Care list provided to:: Patient Choice offered to / list presented to : Patient, Spouse      Expected Discharge Plan and Services In-house Referral: Clinical Social Work Discharge Planning Services: CM Consult   Living arrangements for the past 2 months: Single Family Home                                      Prior Living Arrangements/Services Living arrangements for the past 2 months: Single Family Home Lives with:: Spouse Patient language and need for interpreter reviewed:: Yes Do you feel safe going back to the place where you live?: Yes      Need for Family Participation in Patient Care: Yes (Comment) Care giver support system in place?: Yes (comment) Current home services: DME Criminal Activity/Legal Involvement Pertinent to Current Situation/Hospitalization: No - Comment as needed  Activities of Daily Living   ADL Screening (condition at time of admission) Independently performs ADLs?: No Does the patient have a NEW difficulty with bathing/dressing/toileting/self-feeding that is expected to last >3 days?: Yes (Initiates  electronic notice to provider for possible OT consult) Does the patient have a NEW difficulty with getting in/out of bed, walking, or climbing stairs that is expected to last >3 days?: Yes (Initiates electronic notice to provider for possible PT consult) Does the patient have a NEW difficulty with communication that is expected to last >3 days?: No Is the patient deaf or have difficulty hearing?: No Does the patient have difficulty seeing, even when wearing glasses/contacts?: No Does the patient have difficulty concentrating, remembering, or making decisions?: No  Permission Sought/Granted                  Emotional Assessment Appearance:: Appears stated age Attitude/Demeanor/Rapport: Engaged Affect (typically observed): Accepting Orientation: : Oriented to Self, Oriented to  Time, Oriented to Place, Oriented to Situation Alcohol  / Substance Use: Not Applicable Psych Involvement: No (comment)  Admission diagnosis:  Acute on chronic diastolic heart failure (HCC) [I50.33] Melena [K92.1] Acute systolic congestive heart failure (HCC) [I50.21] AKI (acute kidney injury) (HCC) [N17.9] Cardiorenal syndrome with renal failure, stage 1-4 or unspecified chronic kidney disease, with heart failure (HCC) [I13.0] Patient Active Problem List   Diagnosis Date Noted   Acute on chronic diastolic heart failure (HCC) 02/15/2024   Arcus senilis 02/04/2024   Benign neoplasm of skin 02/04/2024   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) 02/04/2024   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   Osteopenia  02/04/2024   Pain of foot 02/04/2024   Pain of right breast 02/04/2024   Phase of life problem 02/04/2024   Acute gastric ulcer without hemorrhage or perforation 01/23/2024   Hematochezia 01/21/2024   Acute anemia 01/21/2024   ABLA (acute blood loss anemia) 01/19/2024   Essential hypertension 01/07/2024   Rectal bleeding 10/07/2023   Nocturnal enuresis 08/25/2023    Hyponatremia 06/10/2023   Abdominal pain 06/10/2023   Diarrhea 06/10/2023   Candidiasis of mouth 05/15/2023   Type 2 diabetes mellitus with hyperglycemia, with long-term current use of insulin  (HCC) 02/12/2023   Long term (current) use of oral hypoglycemic drugs 02/12/2023   Primary hypertension 01/14/2023   Endometrial cancer (HCC) 01/12/2023   Vulvar itching 12/22/2022   Burning with urination 11/13/2022   Vitamin B12 deficiency 11/09/2022   Frequent falls 09/01/2022   Hemorrhoids 03/11/2022   Microalbuminuria 01/21/2022   Tremor, essential 12/11/2021   Chronic bilateral low back pain with right-sided sciatica 07/19/2021   Vaginal itching 04/16/2021   Sensory ataxia 03/28/2021   Elevated troponin 12/14/2020   Chronic post-traumatic stress disorder (PTSD) 12/06/2020   Senile corneal changes 12/06/2020   Postmenopausal bleeding 12/06/2020   Acquired hammer toe 12/06/2020   Opioid dependence 12/06/2020   Hypermetropia 12/06/2020   Generalized weakness 12/06/2020   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   Generalized anxiety disorder 12/06/2020   Functional diarrhea 10/26/2020   Insulin  dependent type 2 diabetes mellitus (HCC) 09/24/2020   Chronic respiratory failure with hypoxia (HCC) 04/25/2020   Chronic constipation 04/25/2020   Chronic pain syndrome 08/22/2019   CAD (coronary artery disease)    Hypocalcemia    S/P shoulder replacement, 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   Mixed hyperlipidemia 05/20/2019   Vertigo 04/25/2019   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   OSA (obstructive sleep apnea) 02/24/2019   At risk for adverse drug event 07/06/2018   Non-alcoholic fatty liver disease 01/12/2018   Mild intermittent asthma 01/12/2018   Senile osteoporosis 12/15/2017   Atrial fibrillation with RVR (HCC) 05/19/2017   Migraine headache with aura 02/12/2016   Diabetic neuropathy (HCC) 02/06/2016   Bipolar 1 disorder (HCC) 10/04/2015   Major depressive disorder 10/03/2015   Herpes  genitalis in women 07/16/2015   Long term current use of anticoagulant 05/30/2015   Chronic heart failure with preserved ejection fraction (HFpEF) (HCC) 05/30/2015   RLS (restless legs syndrome) 04/27/2015   Insomnia 01/23/2015   Chronic atrial fibrillation with RVR (HCC) 01/04/2015   PCP:  [name], MD Pharmacy:   [facility] [phone] - [place], [place] - [address] AT [facility] [address] [place] [address] Phone: [phone] Fax: [phone]  [facility] - [place], [place] - [address] [place] [address] Phone: [phone] Fax: [phone]  [address] [facility] - [place], [place] - [place] [address] [place] [address] Phone: [phone] Fax: [phone]     Social Drivers of Health (SDOH) Social History: SDOH Screenings   Food Insecurity: No Food Insecurity (02/15/2024)  Housing: Low Risk  (02/15/2024)  Transportation Needs: No Transportation Needs (02/15/2024)  Utilities: Not At Risk (02/15/2024)  Alcohol  Screen: Low Risk  (08/13/2023)  Depression (PHQ2-9): Low Risk  (02/15/2024)  Recent Concern: Depression (PHQ2-9) - High Risk (12/18/2023)  Financial Resource Strain: Low Risk  (08/13/2023)  Physical Activity: Insufficiently Active (08/13/2023)  Social Connections: Socially Integrated (02/15/2024)  Stress: No Stress Concern Present (08/13/2023)  Tobacco Use: Low Risk  (  02/15/2024)  Health Literacy: Adequate Health Literacy (08/13/2023)   SDOH Interventions:     Readmission Risk Interventions    02/16/2024   11:16 AM 01/20/2024    3:33 PM 01/20/2024    3:29 PM  Readmission Risk Prevention Plan  Transportation Screening Complete  Complete  Medication Review (RN Care Manager) Complete  Complete  PCP or Specialist appointment within 3-5 days of discharge   Not Complete  HRI or Home Care Consult Complete  Complete  SW Recovery  Care/Counseling Consult Complete Complete Not Complete  Palliative Care Screening Not Applicable  Not Applicable  Skilled Nursing Facility Not Applicable  Not Applicable

## 2024-02-16 NOTE — Plan of Care (Signed)
  Problem: Skin Integrity: Goal: Risk for impaired skin integrity will decrease Outcome: Progressing   Problem: Education: Goal: Knowledge of General Education information will improve Description: Including pain rating scale, medication(s)/side effects and non-pharmacologic comfort measures Outcome: Progressing   Problem: Clinical Measurements: Goal: Respiratory complications will improve Outcome: Progressing

## 2024-02-16 NOTE — Progress Notes (Signed)
 Progress Note   Patient: Amber Stephenson FMW:[id number] DOB: [birth year] DOA: 02/15/2024     1 DOS: the patient was seen and examined on 02/16/2024   Brief hospital admission narrative:  Amber Stephenson is a 78 y.o. female with medical history significant of endometrial cancer, atrial fibrillation on Eliquis , bipolar disorder type I, chronic pain syndrome, coronary artery disease, chronic kidney disease stage IIIa, type 2 diabetes with nephropathy, hypertension, nonalcoholic fatty liver disease, obstructive sleep apnea, restless leg syndrome and pulmonary hypertension; who presented to the emergency department secondary to melanotic diarrhea, intermittent abdominal pain and increased shortness of breath.  Patient reports symptom has been present for the last 3-4 days and worsening.  No associated nausea, no vomiting, no sick contacts, no changes in her medications.   She was seen by her PCP on the day of admission where she was instructed to presented to the ED for further evaluation, management and possible blood transfusion.   Workup demonstrated hemoglobin of 8, renal function slightly worsened from baseline with a creatinine up to 1.3 and elevated BNP of 664 with a chest x-ray demonstrating cardiomegaly and vascular congestion.   GI service was consulted, GI panel requested and patient started on IV Lasix .  TRH has been consulted to place patient in the hospital for further evaluation and management.    Assessment and plan 1-acute blood loss anemia, melanotic diarrhea, intermittent abdominal pain -Continue as needed analgesics - Continue to hold Eliquis  for now - Hemoglobin down to 7.2 and patient is slightly more symptomatic from acute blood loss anemia. - Will transfuse 1 unit of PRBCs and follow hemoglobin trend - Continue PPI. 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  2-acute on chronic diastolic heart failure - Follow echo result - Continue daily  weights, strict I's and O's and low-sodium diet - Will continue IV diuresis - Continue to follow renal function and electrolytes - Wean off oxygen  supplementation as tolerated (currently mainly in place for comfort; good saturation appreciated on examination).  3-hyperkalemia - Resolved after receiving Lokelma  and initiating diuresing - Continue to follow trend. - Continue telemetry monitoring.  4-depression/anxiety - Continue Lexapro , Abilify  and as needed Xanax .  5-hyperlipidemia - Continue statin.  6-acute kidney injury on chronic kidney disease stage III AA - Appears to be in the setting of decreased perfusion from CHF and ongoing anemia - Creatinine improved after diuresis initiated - 1 unit PRBCs will be transfused - Continue to follow renal function trend - Avoid nephrotoxic agents, hypotension and the use of contrast.  7-hyperlipidemia - Continue statin.  8-type 2 diabetes mellitus - Continue to follow CBG fluctuation - Follow A1c results. - Planning to start sliding scale insulin  to cover CBGs above 200. - Patient following diet control as an outpatient.  9-class I obesity -Body mass index is 33.43 kg/m.  -Low-calorie diet and portion control discussed with patient.  10-restless leg syndrome/chronic pain - Continue treatment with Requip  and home analgesic regimen.   Subjective:  Reporting generalized weakness and increased short winded sensation with activity.  No chest pain, no nausea or vomiting.  Patient also reporting intermittent abdominal discomfort.  No overt bleeding appreciated overnight.  Physical Exam: Vitals:   02/16/24 0500 02/16/24 0613 02/16/24 0621 02/16/24 0835  BP:  114/70 114/70 (!) 142/79  Pulse:  69 69 76  Resp:  18    Temp:  98 F (36.7 C) 98 F (36.7 C)   TempSrc:  Oral Oral   SpO2:  98%  98% 100%  Weight: 82.9 kg     Height:       General exam: Alert, awake, oriented x 3; following commands appropriately. Respiratory system:  Mild expiratory wheezing on exam; no using accessory muscles.  Fine crackles at the bases. Cardiovascular system: Rate controlled, no rubs, no gallops, no JVD. Gastrointestinal system: Abdomen is obese, nondistended, soft and nontender. No organomegaly or masses felt. Normal bowel sounds heard. Central nervous system: Moving full limbs spontaneously.  No focal neurological deficits. Extremities: No cyanosis or clubbing. Skin: No petechiae. Psychiatry: Judgement and insight appear normal. Mood & affect appropriate.   Data Reviewed: CBC: WBCs 4.9, hemoglobin 7.2 and platelet count 141K Basic metabolic panel: Sodium 131, potassium 5.1, chloride 102, bicarb 23, BUN 22, creatinine 1.2 and GFR 46   Family Communication: Husband at bedside.  Disposition: Status is: Inpatient Remains inpatient appropriate because: Continue IV therapy.  Anticipating discharge back home once medically stable.  Time spent: 50 minutes  Author: Eric Nunnery, MD 02/16/2024 12:35 PM  For on call review [email or web address].

## 2024-02-17 ENCOUNTER — Other Ambulatory Visit (HOSPITAL_COMMUNITY): Payer: Self-pay | Admitting: *Deleted

## 2024-02-17 ENCOUNTER — Inpatient Hospital Stay (HOSPITAL_COMMUNITY)

## 2024-02-17 ENCOUNTER — Encounter: Payer: Self-pay | Admitting: *Deleted

## 2024-02-17 ENCOUNTER — Telehealth: Payer: Self-pay | Admitting: *Deleted

## 2024-02-17 DIAGNOSIS — K921 Melena: Secondary | ICD-10-CM | POA: Diagnosis not present

## 2024-02-17 DIAGNOSIS — R1031 Right lower quadrant pain: Secondary | ICD-10-CM

## 2024-02-17 DIAGNOSIS — N179 Acute kidney failure, unspecified: Secondary | ICD-10-CM | POA: Diagnosis not present

## 2024-02-17 DIAGNOSIS — R197 Diarrhea, unspecified: Secondary | ICD-10-CM | POA: Diagnosis not present

## 2024-02-17 DIAGNOSIS — I5031 Acute diastolic (congestive) heart failure: Secondary | ICD-10-CM

## 2024-02-17 DIAGNOSIS — K922 Gastrointestinal hemorrhage, unspecified: Secondary | ICD-10-CM | POA: Diagnosis not present

## 2024-02-17 DIAGNOSIS — I5033 Acute on chronic diastolic (congestive) heart failure: Secondary | ICD-10-CM | POA: Diagnosis not present

## 2024-02-17 DIAGNOSIS — R601 Generalized edema: Secondary | ICD-10-CM

## 2024-02-17 DIAGNOSIS — R140 Abdominal distension (gaseous): Secondary | ICD-10-CM

## 2024-02-17 DIAGNOSIS — C55 Malignant neoplasm of uterus, part unspecified: Secondary | ICD-10-CM

## 2024-02-17 DIAGNOSIS — I130 Hypertensive heart and chronic kidney disease with heart failure and stage 1 through stage 4 chronic kidney disease, or unspecified chronic kidney disease: Secondary | ICD-10-CM | POA: Diagnosis not present

## 2024-02-17 LAB — URINALYSIS, ROUTINE W REFLEX MICROSCOPIC
Bacteria, UA: NONE SEEN
Bilirubin Urine: NEGATIVE
Glucose, UA: NEGATIVE mg/dL
Ketones, ur: NEGATIVE mg/dL
Nitrite: NEGATIVE
Protein, ur: NEGATIVE mg/dL
Specific Gravity, Urine: 1.004 — ABNORMAL LOW (ref 1.005–1.030)
pH: 6 (ref 5.0–8.0)

## 2024-02-17 LAB — ECHOCARDIOGRAM COMPLETE
AR max vel: 2.26 cm2
AV Area VTI: 2.58 cm2
AV Area mean vel: 2.27 cm2
AV Mean grad: 2 mmHg
AV Peak grad: 4.8 mmHg
Ao pk vel: 1.09 m/s
Area-P 1/2: 5.13 cm2
Calc EF: 55.6 %
Height: 62 in
MV M vel: 4.49 m/s
MV Peak grad: 80.6 mmHg
MV VTI: 1.59 cm2
P 1/2 time: 924 ms
S' Lateral: 3.6 cm
Single Plane A2C EF: 54.6 %
Single Plane A4C EF: 57.8 %
Weight: 2924.18 [oz_av]

## 2024-02-17 LAB — CBC
HCT: 29.1 % — ABNORMAL LOW (ref 36.0–46.0)
Hemoglobin: 8.8 g/dL — ABNORMAL LOW (ref 12.0–15.0)
MCH: 25.7 pg — ABNORMAL LOW (ref 26.0–34.0)
MCHC: 30.2 g/dL (ref 30.0–36.0)
MCV: 84.8 fL (ref 80.0–100.0)
Platelets: 137 K/uL — ABNORMAL LOW (ref 150–400)
RBC: 3.43 MIL/uL — ABNORMAL LOW (ref 3.87–5.11)
RDW: 19.2 % — ABNORMAL HIGH (ref 11.5–15.5)
WBC: 5.7 K/uL (ref 4.0–10.5)
nRBC: 0 % (ref 0.0–0.2)

## 2024-02-17 LAB — BPAM RBC
Blood Product Expiration Date: 202510092359
ISSUE DATE / TIME: 202509161353
Unit Type and Rh: 5100

## 2024-02-17 LAB — TYPE AND SCREEN
ABO/RH(D): O POS
Antibody Screen: NEGATIVE
Unit division: 0

## 2024-02-17 LAB — BASIC METABOLIC PANEL WITH GFR
Anion gap: 7 (ref 5–15)
BUN: 17 mg/dL (ref 8–23)
CO2: 20 mmol/L — ABNORMAL LOW (ref 22–32)
Calcium: 7.2 mg/dL — ABNORMAL LOW (ref 8.9–10.3)
Chloride: 102 mmol/L (ref 98–111)
Creatinine, Ser: 0.92 mg/dL (ref 0.44–1.00)
GFR, Estimated: >60 mL/min (ref >60)
Glucose, Bld: 120 mg/dL — ABNORMAL HIGH (ref 70–99)
Potassium: 4.7 mmol/L (ref 3.5–5.1)
Sodium: 129 mmol/L — ABNORMAL LOW (ref 135–145)

## 2024-02-17 LAB — GLUCOSE, CAPILLARY
Glucose-Capillary: 107 mg/dL — ABNORMAL HIGH (ref 70–99)
Glucose-Capillary: 142 mg/dL — ABNORMAL HIGH (ref 70–99)
Glucose-Capillary: 123 mg/dL — ABNORMAL HIGH (ref 70–99)
Glucose-Capillary: 122 mg/dL — ABNORMAL HIGH (ref 70–99)

## 2024-02-17 MED ORDER — SENNA 8.6 MG PO TABS
1.0000 | ORAL_TABLET | Freq: Every evening | ORAL | Status: DC | PRN
  Administered 2024-02-17: 8.6 mg via ORAL
  Filled 2024-02-17: qty 1

## 2024-02-17 MED ORDER — MAGIC MOUTHWASH
5.0000 mL | Freq: Three times a day (TID) | ORAL | Status: DC | PRN
  Administered 2024-02-17 – 2024-02-23 (×5): 5 mL via ORAL
  Filled 2024-02-17 (×7): qty 5

## 2024-02-17 NOTE — Progress Notes (Signed)
 Mobility Specialist Progress Note:    02/17/24 0840  Mobility  Activity Pivoted/transferred to/from [facility]  Level of Assistance Contact guard assist, steadying assist  Assistive Device Other (Comment) (1 HHA)  Distance Ambulated (ft) 3 ft  Range of Motion/Exercises Active;All extremities  Activity Response Tolerated well  Mobility Referral Yes  Mobility visit 1 Mobility  Mobility Specialist Start Time (ACUTE ONLY) 0840  Mobility Specialist Stop Time (ACUTE ONLY) 0900  Mobility Specialist Time Calculation (min) (ACUTE ONLY) 20 min   Pt received in chair, trying to get to [facility]. Required CGA to stand and transfer with 1 hand-held assist. Returned pt to chair, alarm on. Call bell in reach, all needs met.  Normal [place] Mobility Specialist Please contact via Special educational needs teacher or  Rehab office at [phone]

## 2024-02-17 NOTE — Patient Instructions (Signed)
 Amber Stephenson - I am sorry I was unable to reach you today for our scheduled appointment. I work with [name], MD and am calling to support your healthcare needs. Please contact me at [phone] at your earliest convenience. I look forward to speaking with you soon.   Thank you,  Rosina Forte, BSN RN [facility], [facility] RN Care Manager Direct Dial : [phone]  Fax: [phone]

## 2024-02-17 NOTE — Plan of Care (Signed)
  Problem: Education: Goal: Ability to describe self-care measures that may prevent or decrease complications (Diabetes Survival Skills Education) will improve Outcome: Progressing   Problem: Fluid Volume: Goal: Ability to maintain a balanced intake and output will improve Outcome: Progressing   Problem: Metabolic: Goal: Ability to maintain appropriate glucose levels will improve Outcome: Progressing   Problem: Metabolic: Goal: Ability to maintain appropriate glucose levels will improve Outcome: Progressing   Problem: Nutritional: Goal: Maintenance of adequate nutrition will improve Outcome: Progressing

## 2024-02-17 NOTE — Progress Notes (Signed)
 Progress Note   Patient: Amber Stephenson FMW:[id number] DOB: [birth year] DOA: 02/15/2024     2 DOS: the patient was seen and examined on 02/17/2024   Brief hospital admission narrative:  Amber Stephenson is a 78 y.o. female with medical history significant of endometrial cancer, atrial fibrillation on Eliquis , bipolar disorder type I, chronic pain syndrome, coronary artery disease, chronic kidney disease stage IIIa, type 2 diabetes with nephropathy, hypertension, nonalcoholic fatty liver disease, obstructive sleep apnea, restless leg syndrome and pulmonary hypertension; who presented to the emergency department secondary to melanotic diarrhea, intermittent abdominal pain and increased shortness of breath.  Patient reports symptom has been present for the last 3-4 days and worsening.  No associated nausea, no vomiting, no sick contacts, no changes in her medications.   She was seen by her PCP on the day of admission where she was instructed to presented to the ED for further evaluation, management and possible blood transfusion.   Workup demonstrated hemoglobin of 8, renal function slightly worsened from baseline with a creatinine up to 1.3 and elevated BNP of 664 with a chest x-ray demonstrating cardiomegaly and vascular congestion.   GI service was consulted, GI panel requested and patient started on IV Lasix .  TRH has been consulted to place patient in the hospital for further evaluation and management.    Assessment and plan 1-acute blood loss anemia, melanotic diarrhea, intermittent abdominal pain -Continue as needed analgesics - Continue to hold Eliquis  for now - Hemoglobin up to 8.8 after 1 unit PRBC transfused on 02/17/24 - Continue PPI twice a day. 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 - Planning to advance diet as tolerated and if diarrhea recurs will use budenoside as per GI recommendations.  2-acute on chronic diastolic heart failure - Follow echo  result - Continue daily weights, strict I's and O's and low-sodium diet - Will continue IV diuresis for another 24 hours. - Continue to follow renal function and electrolytes - After discussing with patient she has reported using 2 L supplementations at home on and off for comfort.  No crackles were appreciated on exam on today's exam.  3-hyperkalemia - Resolved after receiving Lokelma  and initiating diuresing - Continue to follow trend intermittently. - Continue telemetry monitoring.  4-depression/anxiety - Continue Lexapro , Abilify  and as needed Xanax .  5-hyperlipidemia - Continue statin.  6-acute kidney injury on chronic kidney disease stage III AA - Appears to be in the setting of decreased perfusion from CHF and ongoing anemia - Creatinine improved after diuresis initiated - 1 unit PRBCs will be transfused - Continue to follow renal function trend - Avoid nephrotoxic agents, hypotension and the use of contrast.  7-hyperlipidemia - Continue statin.  8-type 2 diabetes mellitus - Continue to follow CBG fluctuation - Follow A1c results. - Planning to start sliding scale insulin  to cover CBGs above 200. - Patient following diet control as an outpatient.  9-class I obesity -Body mass index is 33.43 kg/m.  -Low-calorie diet and portion control discussed with patient.  10-restless leg syndrome/chronic pain - Continue treatment with Requip  and home analgesic regimen.   Subjective:  No overnight bleeding; reports no further diarrhea events.  Intermittent abdominal discomfort.  No nausea, no vomiting and no chest pain.  Reports improvement in her breathing.  Physical Exam: Vitals:   02/16/24 1641 02/16/24 1946 02/17/24 0259 02/17/24 1423  BP: (!) 138/94 (!) 152/94 (!) 140/86 (!) 141/76  Pulse: 74 88  80  Resp: 14 18 16    Temp: ROLLEN)  97.3 F (36.3 C) 98.4 F (36.9 C) (!) 97.5 F (36.4 C)   TempSrc: Oral Oral Oral   SpO2:  99% 99% 99%  Weight:      Height:        General exam: Alert, awake, oriented x 3; no chest pain, no nausea vomiting.  Reports decreased appetite.  Improvement in her breathing and overall feeling better.  No overnight bleeding appreciated. Respiratory system: No crackles on exam; no using accessory muscle.  Good saturation on 2 L supplementation.  (Patient reports that at home she uses 2 L as needed for comfort). Cardiovascular system: Rate controlled, no rubs, no gallops, no JVD. Gastrointestinal system: Abdomen is obese, nondistended, soft and nontender. No organomegaly or masses felt. Normal bowel sounds heard. Central nervous system: No focal neurological deficits. Extremities: No cyanosis or clubbing. Skin: No petechiae. Psychiatry: Judgement and insight appear normal. Mood & affect appropriate.   Data Reviewed: CBC: WBCs 5.7, hemoglobin 8.8 and platelet count 137K Basic metabolic panel: Sodium 129, potassium 4.7, chloride 102, bicarb 20 BUN 17, creatinine 0.92 and GFR >60 Urinalysis: Demonstrating specific gravity 1.004, small amount leukocyte esterase and negative nitrite.   Family Communication: Husband at bedside.  Disposition: Status is: Inpatient Remains inpatient appropriate because: Continue IV therapy.  Anticipating discharge back home once medically stable.   Time spent: 50 minutes  Author: Eric Nunnery, MD 02/17/2024 4:47 PM  For on call review [email or web address].

## 2024-02-17 NOTE — Progress Notes (Signed)
 Subjective: Still with continued RLQ pain. No diarrhea since Monday. States she had a sensation last night where she felt some abdominal cramping and like she needed to have a BM but never did. Symptoms eventually passed after a few hours.  Denies nausea or vomiting. She was not able to eat this morning, states it did not taste well once she started to consume it.   Objective: Vital signs in last 24 hours: Temp:  [97.3 F (36.3 C)-98.4 F (36.9 C)] 97.5 F (36.4 C) (09/17 0259) Pulse Rate:  [71-88] 88 (09/16 1946) Resp:  [14-18] 16 (09/17 0259) BP: (130-152)/(77-94) 140/86 (09/17 0259) SpO2:  [99 %] 99 % (09/17 0259) Last BM Date : 02/14/24 General:   Alert and oriented, pleasant Head:  Normocephalic and atraumatic. Eyes:  No icterus, sclera clear. Conjuctiva pink.  Mouth:  Without lesions, mucosa pink and moist.  Heart:  S1, S2 present, no murmurs noted.  Lungs: Clear to auscultation bilaterally, without wheezing, rales, or rhonchi.  Abdomen:  Bowel sounds present, soft, some TTP of RLQ, non-distended. No HSM or hernias noted. No rebound or guarding. No masses appreciated  Extremities:  Without clubbing, some edema present  Neurologic:  Alert and  oriented x4;  grossly normal neurologically. Skin:  Warm and dry, intact without significant lesions.  Psych:  Alert and cooperative. Normal mood and affect.  Intake/Output from previous day: 09/16 0701 - 09/17 0700 In: 344 [I.V.:18; Blood:326] Out: -  Intake/Output this shift: No intake/output data recorded.  Lab Results: Recent Labs    02/15/24 1334 02/16/24 0412 02/17/24 0409  WBC 6.5 4.9 5.7  HGB 8.0* 7.2* 8.8*  HCT 26.3* 23.7* 29.1*  PLT 158 141* 137*   BMET Recent Labs    02/15/24 1334 02/16/24 0412 02/17/24 0409  NA 130* 131* 129*  K 5.7* 5.1 4.7  CL 102 102 102  CO2 20* 23 20*  GLUCOSE 106* 112* 120*  BUN 23 22 17   CREATININE 1.30* 1.22* 0.92  CALCIUM  7.5* 7.4* 7.2*   LFT Recent Labs    02/15/24 1334   PROT 6.4*  ALBUMIN  3.1*  AST 21  ALT 12  ALKPHOS 127*  BILITOT 1.0    Studies/Results: CT ABDOMEN PELVIS WO CONTRAST Result Date: 02/15/2024 CLINICAL DATA:  Right lower quadrant abdominal pain.  Diarrhea. EXAM: CT ABDOMEN AND PELVIS WITHOUT CONTRAST TECHNIQUE: Multidetector CT imaging of the abdomen and pelvis was performed following the standard protocol without IV contrast. RADIATION DOSE REDUCTION: This exam was performed according to the departmental dose-optimization program which includes automated exposure control, adjustment of the mA and/or kV according to patient size and/or use of iterative reconstruction technique. COMPARISON:  01/19/2024 FINDINGS: Lower chest: Substantial cardiomegaly. Low-density blood pool pool favoring anemia. Dense mitral valve calcification. Mild aortic valve calcification. Mosaic attenuation at the lung bases with some reticular interstitial accentuation, similar to the prior exam. Faint nodularity inferiorly in the right middle lobe likewise similar to the prior exam. Hepatobiliary: Unremarkable Pancreas: Unremarkable Spleen: Unremarkable Adrenals/Urinary Tract: Stable appearance of the adrenal glands and kidneys, no hydronephrosis or hydroureter. Borderline urinary bladder wall thickening, cystitis not excluded, correlate with urine analysis. Stomach/Bowel: Proximal transverse duodenal diverticulum. Normal appendix. No dilated bowel to indicate obstruction. Numerous jejunal diverticular present. None of these appear overtly inflamed. Equivocal wall thickening in the descending and sigmoid colon, much of the appearance may be from nondistention rather than necessarily from colitis/inflammation. Vascular/Lymphatic: Atherosclerosis is present, including aortoiliac atherosclerotic disease. Atheromatous plaque at the origins of the celiac  trunk and SMA. Patency not assessed on today's noncontrast examination. No pathologic adenopathy observed. Reproductive: Unremarkable  Other: Small amount of ascites mainly along the left paracolic gutter. Presacral edema. Musculoskeletal: Subcutaneous edema in the anterior pannus, cannot exclude panniculitis. Right groin hernia contains adipose tissue. Notable subcutaneous edema also present laterally along the right upper thigh. Rib deformities associated with healing left anterior fifth and sixth rib fractures. IMPRESSION: 1. Equivocal wall thickening in the descending and sigmoid colon, much of the appearance may be from nondistention rather than necessarily from colitis/inflammation. 2. Small amount of ascites mainly along the left paracolic gutter. 3. Subcutaneous edema in the anterior pannus, cannot exclude panniculitis. 4. Substantial cardiomegaly. Low-density blood pool favoring anemia. 5. Nonspecific mosaic attenuation at the lung bases with some reticular interstitial accentuation, similar to the prior exam. Faint nodularity inferiorly in the right middle lobe likewise similar to the prior exam. 6. Right groin hernia contains adipose tissue. 7. Notable subcutaneous edema laterally along the right upper thigh. 8. Healing left anterior fifth and sixth rib fractures. 9.  Aortic Atherosclerosis (ICD10-I70.0). Electronically Signed   By: Ryan Salvage M.D.   On: 02/15/2024 17:34   DG Chest Port 1 View Result Date: 02/15/2024 CLINICAL DATA:  Pain EXAM: PORTABLE CHEST 1 VIEW COMPARISON:  01/21/2024 FINDINGS: Cardiomegaly, vascular congestion. Interstitial prominence may reflect interstitial edema. No effusions or acute bony abnormality. IMPRESSION: Continued interstitial edema pattern, not significantly changed since prior study. Electronically Signed   By: Franky Crease M.D.   On: 02/15/2024 13:44   US  Venous Img Lower Unilateral Left Result Date: 02/15/2024 CLINICAL DATA:  Left lower extremity edema EXAM: LEFT LOWER EXTREMITY VENOUS DOPPLER ULTRASOUND TECHNIQUE: Gray-scale sonography with compression, as well as color and duplex  ultrasound, were performed to evaluate the deep venous system(s) from the level of the common femoral vein through the popliteal and proximal calf veins. COMPARISON:  None Available. FINDINGS: VENOUS Normal compressibility of the common femoral, superficial femoral, and popliteal veins, as well as the visualized calf veins. Visualized portions of profunda femoral vein and great saphenous vein unremarkable. No filling defects to suggest DVT on grayscale or color Doppler imaging. Doppler waveforms show normal direction of venous flow, normal respiratory plasticity and response to augmentation. Limited views of the contralateral common femoral vein are unremarkable. OTHER Superficial subcutaneous edema along the left calf. Limitations: none IMPRESSION: 1. No evidence of acute DVT. 2. Superficial subcutaneous edema may reflect volume overload or cellulitis. Electronically Signed   By: Wilkie Lent M.D.   On: 02/15/2024 13:32    Assessment: Amber Stephenson is a 78 y.o. female with history of endometrial cancer medically inoperable, s/p IMRT, Afib on Eliquis , bipolar 1 disorder, CAD, CHF, chronic pain, DM, HTN, Herpes, NAFLD, OSA, osteopenia, RLS, pulmonary hypertension, previous history of microscopic colitis, diverticulitis in May 2025 imaging, GERD, IDA, GI bleed in August 2025 secondary to colonic AVMs, and chronic constipation who presented to the ED 9/15 for concerns of worsening diarrhea from baseline, bloating and distention as well as ongoing lower GI bleeding after follow-up with primary care.  GI consulted for further evaluation.  Diarrhea: -C diff negative -GI pathogen panel negative  -history of constipation/soft stools, watery over the last week -history of microscopic colitis in 2019 -no diarrhea since Monday -sensation of cramping and needing to defecate last night but symptoms resolved spontaneously with no BM -consider budesonide  if diarrhea recurs    RLQ pain/abdominal  distention: -anasarca on imaging -could be secondary to bowel habits,  IBS -generalized tenderness to palpation  -CT A/P done Monday with equivocal wall thickening of the descending sigmoid colon, most likely from nondistention rather than inflammation, small amount of ascites in the left paracolic gutter, subcutaneous edema in the anterior pannus unable to exclude panniculitis, substantial cardiomegaly, mosaic attenuation of the lung bases with reticular interstitial accentuation similar to prior with faint nodularity similar to prior exam, right groin hernia containing adipose tissue, subcutaneous edema laterally along right upper thigh, healing left 5th and 6th rib fractures   Etiology unclear at this time, could be secondary to hernia. No evidence of colitis on CT imaging.   GI bleed: -previous BRBPR and melena which has tapered off -on eliquis  -history of known PUD and colonic angiectasias, hemorrhoid bleeding in the past -hgb down to 7.2 yesterday, back up to 8.8 after transfusion yesterday (1 unit total this admission) -recommended possible EGD once optimized from cardiac standpoint, ECHO pending, can be done as outpatient -low iron studies previously and on PO iron, may benefit from IV iron in the future pending how her levels respond to PO supplementation -if BRBPR recurs, consider flex sig for ablation of radiation proctosigmoiditis    Plan: -PPI BID -advance diet as tolerated and monitor for recurrent diarrhea -trend h&h, transfuse for hgb 7 or less -consider budesonide  if diarrhea recurs -cardiac clearance prior to any Endo evaluations -consider IV iron if not responding to PO iron    LOS: 2 days    02/17/2024, 9:13 AM   Spyros Winch L. Elery Cadenhead, MSN, APRN, AGNP-C Adult-Gerontology Nurse Practitioner [facility] at [facility]

## 2024-02-18 ENCOUNTER — Inpatient Hospital Stay (HOSPITAL_COMMUNITY)

## 2024-02-18 DIAGNOSIS — K921 Melena: Secondary | ICD-10-CM | POA: Diagnosis not present

## 2024-02-18 DIAGNOSIS — I130 Hypertensive heart and chronic kidney disease with heart failure and stage 1 through stage 4 chronic kidney disease, or unspecified chronic kidney disease: Secondary | ICD-10-CM | POA: Diagnosis not present

## 2024-02-18 DIAGNOSIS — N179 Acute kidney failure, unspecified: Secondary | ICD-10-CM | POA: Diagnosis not present

## 2024-02-18 DIAGNOSIS — I5033 Acute on chronic diastolic (congestive) heart failure: Secondary | ICD-10-CM | POA: Diagnosis not present

## 2024-02-18 LAB — BASIC METABOLIC PANEL WITH GFR
Anion gap: 6 (ref 5–15)
BUN: 14 mg/dL (ref 8–23)
CO2: 25 mmol/L (ref 22–32)
Calcium: 7.2 mg/dL — ABNORMAL LOW (ref 8.9–10.3)
Chloride: 102 mmol/L (ref 98–111)
Creatinine, Ser: 0.65 mg/dL (ref 0.44–1.00)
GFR, Estimated: >60 mL/min (ref >60)
Glucose, Bld: 89 mg/dL (ref 70–99)
Potassium: 3.5 mmol/L (ref 3.5–5.1)
Sodium: 133 mmol/L — ABNORMAL LOW (ref 135–145)

## 2024-02-18 LAB — CBC
HCT: 27.1 % — ABNORMAL LOW (ref 36.0–46.0)
Hemoglobin: 8.5 g/dL — ABNORMAL LOW (ref 12.0–15.0)
MCH: 26.2 pg (ref 26.0–34.0)
MCHC: 31.4 g/dL (ref 30.0–36.0)
MCV: 83.6 fL (ref 80.0–100.0)
Platelets: 146 K/uL — ABNORMAL LOW (ref 150–400)
RBC: 3.24 MIL/uL — ABNORMAL LOW (ref 3.87–5.11)
RDW: 19.6 % — ABNORMAL HIGH (ref 11.5–15.5)
WBC: 6.2 K/uL (ref 4.0–10.5)
nRBC: 0 % (ref 0.0–0.2)

## 2024-02-18 LAB — GLUCOSE, CAPILLARY
Glucose-Capillary: 102 mg/dL — ABNORMAL HIGH (ref 70–99)
Glucose-Capillary: 162 mg/dL — ABNORMAL HIGH (ref 70–99)
Glucose-Capillary: 149 mg/dL — ABNORMAL HIGH (ref 70–99)

## 2024-02-18 MED ORDER — FUROSEMIDE 40 MG PO TABS
40.0000 mg | ORAL_TABLET | Freq: Every day | ORAL | Status: DC
  Administered 2024-02-19 – 2024-02-25 (×7): 40 mg via ORAL
  Filled 2024-02-18 (×7): qty 1

## 2024-02-18 MED ORDER — PHENAZOPYRIDINE HCL 100 MG PO TABS
100.0000 mg | ORAL_TABLET | Freq: Three times a day (TID) | ORAL | Status: DC
  Administered 2024-02-18 – 2024-02-21 (×9): 100 mg via ORAL
  Filled 2024-02-18 (×9): qty 1

## 2024-02-18 MED ORDER — BARIUM SULFATE 2 % PO SUSP
450.0000 mL | Freq: Two times a day (BID) | ORAL | Status: DC
  Administered 2024-02-18: 450 mL via ORAL

## 2024-02-18 NOTE — Progress Notes (Signed)
 Heart Failure Navigator Progress Note  Called patient in her room 305 @ [facility] to provide her with CHF education and schedule her [facility] appointment.  She prefers that I call back when her husband arrives later today.  I will reach out to RN at the bedside to provide patient with the Living Better With Heart Failure folder prior reaching back to her today.  Charmaine Pines, RN, BSN [facility] Heart Failure Navigator Secure Chat Only

## 2024-02-18 NOTE — Progress Notes (Signed)
 Heart Failure Nurse Navigator Progress Note  PCP: [name], MD PCP-Cardiologist: Wilbert Bihari, MD Admission Diagnosis: Acute systolic congestive heart failure (HCC) AKI (acute kidney injury) (HCC) Cardiorenal syndrome wit  renal failure, stage 1-4 or unspecified chronic kidney disease, with heart failure (HCC) Melena Admitted from: Home  [facility]- Patient called remotely to her room 305. 2 Patient identifiers confirmed prior to conversation.  She requested that I call her husband Wilton) cell phone so they both could hear Heart Failure education.  Presentation:   Amber Stephenson is a 78 y.o. female.  She presented with chief complaint   of worsening leg swelling and increased abdominal girth. Patient had history of Atrial Fibrillation on Eliquis , HFpEF, HTN, HLD, Type 2 DM, Stage 2-3 CKD, Asthma, OSA (intolerant to CPAP) and nonobstructive CAD. She reported abdominal pain in lower quadrant with dark, tarry stools for the last 4 days. She had been on iron supplements.  Recent colonoscopy and upper endoscopy ws noted to have non bleeding ulcers and some polyps resected. She suspected a 5 pound weight gain the week before. BNP 664. Chest x-ray-Cardiomegaly, vascular congestion.  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   Benign paroxysmal positional vertigo due to bilateral vestibular disorder 05/20/2019   Bipolar 1 disorder (HCC) 01/23/2015   with GAD, benzo dependence   CAD (coronary artery disease)    Nonobstructive; Managed by Dr. Charls   Cardiomegaly 01/12/2018   CHF (congestive heart failure) 06/08/2022   Chronic back pain 01/04/2015   Chronic  constipation 04/25/2020   Chronic diastolic heart failure (HCC) 05/30/2015   Chronic pain syndrome 08/22/2019   back pain, sacroiliitis   Chronic post-traumatic stress disorder (PTSD) 12/06/2020   Diabetic neuropathy (HCC) 02/06/2016   Dyslipidemia 09/24/2020   Essential hypertension    Functional diarrhea 10/26/2020   Herpes genitalis in women 07/16/2015   History of adenomatous polyp of colon 05/21/2019   Overview:   03/31/17: Colonoscopy: nonadvanced adenoma, microscopic colitis, f/u 5 yrs, Murphy/GAP   History of radiation therapy    Endometrial- 02/18/23-03/31/23-Dr. Lynwood Kinard   Hypotension 09/01/2022   Insomnia 01/23/2015   Metabolic acidosis 01/12/2023   Migraine headache with aura 02/12/2016   Myofascial pain dysfunction syndrome 08/22/2019   Non-alcoholic fatty liver disease 01/12/2018   OSA (obstructive sleep apnea) 02/24/2019   10/09/2018 - HST  - AHI 40.6    Osteopenia 12/06/2020   Rx alendronate  35 mg.   Pinguecula 12/06/2020   Presbyopia 12/06/2020   Pulmonary hypertension    RLS (restless legs syndrome) 04/27/2015   RSV (respiratory syncytial virus infection) 06/10/2023   Tremor, essential 12/11/2021   Type II diabetes mellitus (HCC) 09/24/2020     Social History   Socioeconomic History   Marital status: Married    Spouse name: Dwight   Number of children: 3   Years of education: 15   Highest education level: Associate degree: academic program  Occupational History   Occupation: Retired    Comment: Chief Financial Officer  Tobacco Use   Smoking status: Never   Smokeless tobacco: Never  Vaping Use   Vaping status: Never Used  Substance and Sexual Activity   Alcohol  use: Not Currently    Alcohol /week: 0.0 standard  drinks of alcohol    Drug use: No   Sexual activity: Not Currently    Partners: Male    Birth control/protection: Post-menopausal  Other Topics Concern   Not on file  Social History Narrative   Lives at home with husband.    They have 3 children  who live away - [place] , [place] , and [place] .   She is from [place]  and most of her family lives there.   Her husbands's family live nearby   Right handed.   Social Drivers of Corporate investment banker Strain: Low Risk  (08/13/2023)   Overall Financial Resource Strain (CARDIA)    Difficulty of Paying Living Expenses: Not hard at all  Food Insecurity: No Food Insecurity (02/15/2024)   Hunger Vital Sign    Worried About Running Out of Food in the Last Year: Never true    Ran Out of Food in the Last Year: Never true  Transportation Needs: No Transportation Needs (02/15/2024)   PRAPARE - Administrator, Civil Service (Medical): No    Lack of Transportation (Non-Medical): No  Physical Activity: Insufficiently Active (08/13/2023)   Exercise Vital Sign    Days of Exercise per Week: 3 days    Minutes of Exercise per Session: 20 min  Stress: No Stress Concern Present (08/13/2023)   [employer] of Occupational Health - Occupational Stress Questionnaire    Feeling of Stress : Only a little  Social Connections: Socially Integrated (02/15/2024)   Social Connection and Isolation Panel    Frequency of Communication with Friends and Family: More than three times a week    Frequency of Social Gatherings with Friends and Family: Once a week    Attends Religious Services: More than 4 times per year    Active Member of [employer] or Organizations: Yes    Attends Engineer, structural: More than 4 times per year    Marital Status: Married   Water engineer and Provision:  Detailed education and instructions provided on heart failure disease management including the following:  Signs and symptoms of Heart Failure When to call the physician Importance of daily weights Low sodium diet Fluid restriction Medication management Anticipated future follow-up appointments  Patient education given on each of the above topics.  Patient acknowledges understanding via teach back  method and acceptance of all instructions.  Education Materials:  Living Better With Heart Failure Booklet, HF zone tool, & Daily Weight Tracker Tool.  Patient has scale at home: Yes Patient has pill box at home: Yes    High Risk Criteria for Readmission and/or Poor Patient Outcomes: Heart failure hospital admissions (last 6 months): 1  No Show rate: 4% Difficult social situation: None Demonstrates medication adherence: Yes Primary Language: English Literacy level: Reading, Writing, & Comprehension  Barriers of Care:   Elevated daily sodium intake.  Patient often eats out.  Considerations/Referrals:  Referral made to Heart Failure Pharmacist Stewardship: N/A Referral made to Heart Failure CSW/NCM TOC: No Referral made to Heart & Vascular TOC clinic: Yes.  [facility] preferred [facility] location. First available [facility] appointment 03/02/24 @ 9:45 AM [facility].  Items for Follow-up on DC/TOC: Diet & Fluid Restrictions Daily Weights Continued Heart Failure Education  Charmaine Pines, RN, BSN [facility] Heart Failure Navigator Secure Chat Only

## 2024-02-18 NOTE — Progress Notes (Signed)
 Progress Note   Patient: Amber Stephenson FMW:[id number] DOB: [birth year] DOA: 02/15/2024     3 DOS: the patient was seen and examined on 02/18/2024   Brief hospital admission narrative:  Amber Stephenson is a 78 y.o. female with medical history significant of endometrial cancer, atrial fibrillation on Eliquis , bipolar disorder type I, chronic pain syndrome, coronary artery disease, chronic kidney disease stage IIIa, type 2 diabetes with nephropathy, hypertension, nonalcoholic fatty liver disease, obstructive sleep apnea, restless leg syndrome and pulmonary hypertension; who presented to the emergency department secondary to melanotic diarrhea, intermittent abdominal pain and increased shortness of breath.  Patient reports symptom has been present for the last 3-4 days and worsening.  No associated nausea, no vomiting, no sick contacts, no changes in her medications.   She was seen by her PCP on the day of admission where she was instructed to presented to the ED for further evaluation, management and possible blood transfusion.   Workup demonstrated hemoglobin of 8, renal function slightly worsened from baseline with a creatinine up to 1.3 and elevated BNP of 664 with a chest x-ray demonstrating cardiomegaly and vascular congestion.   GI service was consulted, GI panel requested and patient started on IV Lasix .  TRH has been consulted to place patient in the hospital for further evaluation and management.    Assessment and plan 1-acute blood loss anemia, melanotic diarrhea, intermittent abdominal pain -Continue as needed analgesics - Continue to hold Eliquis  for now; follow GI service recommendation for clearance to resume it. - Hemoglobin up to 8.5 after 1 unit PRBC transfused on 02/17/24 - Continue PPI twice a day. 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 - Despite clearance/diet advancement patient without appetite. - CT abdomen and pelvis will be repeated in the  setting of left lower quadrant pain and distention appreciated on exam.  2-acute on chronic diastolic heart failure - Follow echo result - Continue daily weights, strict I's and O's and low-sodium diet - Excellent diuresis with IV Lasix  - Planning to resume oral diuretics and follow response. - Continue to follow renal function and electrolytes - After discussing with patient she has reported using 2 L supplementations at home on and off for comfort.  No crackles were appreciated on exam on today's exam.  3-hyperkalemia - Resolved after receiving Lokelma  and initiating diuresing - Continue to follow trend intermittently. - Continue telemetry monitoring.  4-depression/anxiety - Continue Lexapro , Abilify  and as needed Xanax .  5-hyperlipidemia - Continue statin.  6-acute kidney injury on chronic kidney disease stage III AA - Appears to be in the setting of decreased perfusion from CHF and ongoing anemia - Creatinine improved after diuresis initiated - 1 unit PRBCs will be transfused - Continue to follow renal function trend - Avoid nephrotoxic agents, hypotension and the use of contrast.  7-hyperlipidemia - Continue statin.  8-type 2 diabetes mellitus - Continue to follow CBG fluctuation - Follow A1c results. - Planning to start sliding scale insulin  to cover CBGs above 200. - Patient following diet control as an outpatient.  9-class I obesity -Body mass index is 33.43 kg/m.  -Low-calorie diet and portion control discussed with patient.  10-restless leg syndrome/chronic pain - Continue treatment with Requip  and home analgesic regimen.  11-dysuria - Pyridium  will be started - Urinalysis not demonstrating UTI.  Subjective:  Reports breathing significantly improved; no chest pain, no nausea, no vomiting.  Complaining of left lower quadrant pain and anorexia.  Physical Exam: Vitals:   02/17/24 1423 02/17/24  2100 02/18/24 0538 02/18/24 1215  BP: (!) 141/76 (!) 160/102  (P) 135/82 129/76  Pulse: 80 89 (P) 79 80  Resp:      Temp:   (P) 97.9 F (36.6 C) 98.4 F (36.9 C)  TempSrc:   (P) Oral Oral  SpO2: 99% 100% (P) 100% 100%  Weight:      Height:       General exam: Alert, awake, oriented x 3; reports breathing better but is complaining of dysuria and left lower quadrant pain. Respiratory system: No frank crackles, no using accessory muscles; 2 L nasal cannula in place for comfort (patient uses chronically as needed). Cardiovascular system: Rate controlled, no rubs, no gallops, no JVD. Gastrointestinal system: Abdomen is slightly distended, without guarding and with positive bowel sounds.  Reporting left lower quadrant tenderness on palpation. Central nervous system: Alert and oriented. No focal neurological deficits. Extremities: No cyanosis, clubbing or edema. Skin: No petechiae. Psychiatry: Judgement and insight appear normal. Mood & affect appropriate.   Latest data Reviewed: Basic metabolic panel: Sodium 133, potassium 3.5, chloride 102, bicarb 25, BUN 14, creatinine 0.65 and GFR >60 CBC: WBC 6.2, hemoglobin 8.5 and platelet count 146K   Family Communication: Husband at bedside.  Disposition: Status is: Inpatient Remains inpatient appropriate because: Continue IV therapy.  Anticipating discharge back home once medically stable.   Time spent: 50 minutes  Author: Eric Nunnery, MD 02/18/2024 5:59 PM  For on call review [email or web address].

## 2024-02-18 NOTE — Plan of Care (Signed)
  Problem: Coping: Goal: Ability to adjust to condition or change in health will improve Outcome: Progressing   Problem: Fluid Volume: Goal: Ability to maintain a balanced intake and output will improve Outcome: Progressing   Problem: Metabolic: Goal: Ability to maintain appropriate glucose levels will improve Outcome: Progressing   Problem: Nutritional: Goal: Maintenance of adequate nutrition will improve Outcome: Progressing   Problem: Skin Integrity: Goal: Risk for impaired skin integrity will decrease Outcome: Progressing   Problem: Education: Goal: Knowledge of General Education information will improve Description: Including pain rating scale, medication(s)/side effects and non-pharmacologic comfort measures Outcome: Progressing   Problem: Activity: Goal: Risk for activity intolerance will decrease Outcome: Progressing   Problem: Elimination: Goal: Will not experience complications related to bowel motility Outcome: Progressing Goal: Will not experience complications related to urinary retention Outcome: Progressing   Problem: Pain Managment: Goal: General experience of comfort will improve and/or be controlled Outcome: Progressing   Problem: Safety: Goal: Ability to remain free from injury will improve Outcome: Not Progressing

## 2024-02-18 NOTE — Therapy (Unsigned)
 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   Benign paroxysmal positional vertigo due to bilateral vestibular disorder 05/20/2019   Bipolar 1 disorder (HCC) 01/23/2015   with GAD, benzo dependence   CAD (coronary artery disease)    Nonobstructive; Managed by Dr. Charls   Cardiomegaly 01/12/2018   CHF (congestive heart failure) 06/08/2022   Chronic back pain 01/04/2015   Chronic constipation 04/25/2020   Chronic diastolic heart failure (HCC) 05/30/2015   Chronic pain syndrome 08/22/2019   back pain, sacroiliitis   Chronic post-traumatic stress disorder (PTSD) 12/06/2020   Diabetic neuropathy (HCC) 02/06/2016   Dyslipidemia 09/24/2020   Essential hypertension    Functional diarrhea 10/26/2020   Herpes genitalis in women 07/16/2015   History of adenomatous polyp of colon 05/21/2019   Overview:   03/31/17: Colonoscopy: nonadvanced adenoma, microscopic colitis, f/u 5 yrs, Murphy/GAP   History of radiation therapy    Endometrial- 02/18/23-03/31/23-Dr. Lynwood Kinard   Hypotension 09/01/2022   Insomnia 01/23/2015   Metabolic acidosis 01/12/2023   Migraine headache with aura 02/12/2016   Myofascial pain dysfunction syndrome 08/22/2019   Non-alcoholic fatty liver disease 01/12/2018   OSA (obstructive sleep apnea) 02/24/2019   10/09/2018 - HST  - AHI 40.6    Osteopenia 12/06/2020   Rx alendronate  35 mg.   Pinguecula 12/06/2020   Presbyopia 12/06/2020   Pulmonary hypertension    RLS (restless legs syndrome) 04/27/2015   RSV  (respiratory syncytial virus infection) 06/10/2023   Tremor, essential 12/11/2021   Type II diabetes mellitus (HCC) 09/24/2020   Past Surgical History:  Procedure Laterality Date   BREAST REDUCTION SURGERY     COLONOSCOPY  2018   6 mm cecal tubular adenoma, sigmoid diverticulosis, random colon biopsies consistent with microscopic colitis   COLONOSCOPY N/A 01/22/2024   Procedure: COLONOSCOPY;  Surgeon: Eartha Angelia Sieving, MD;  Location: [facility];  Service: Gastroenterology;  Laterality: N/A;   ESOPHAGOGASTRODUODENOSCOPY N/A 01/22/2024   Procedure: EGD (ESOPHAGOGASTRODUODENOSCOPY);  Surgeon: Eartha Angelia, Sieving, MD;  Location: [facility];  Service: Gastroenterology;  Laterality: N/A;   EYE SURGERY Right    cateracts   HAMMER TOE SURGERY     LEFT HEART CATH AND CORONARY ANGIOGRAPHY N/A 02/03/2018   Procedure: LEFT HEART CATH AND CORONARY ANGIOGRAPHY;  Surgeon: [name], Peter M, MD;  Location: [facility] INVASIVE CV LAB;  Service: Cardiovascular;  Laterality: N/A;   REVERSE SHOULDER ARTHROPLASTY Right 08/19/2019   Procedure: REVERSE SHOULDER ARTHROPLASTY;  Surgeon: Kay Kemps, MD;  Location: [facility];  Service: Orthopedics;  Laterality: Right;  interscalene block   SHOULDER SURGERY Right    I BROKE MY SHOUDLER   THIGH SURGERY     TO REMOVE A TUMOR    Patient Active Problem List   Diagnosis Date Noted   RLQ abdominal pain 02/17/2024   Acute on chronic diastolic heart failure (HCC) 02/15/2024   Arcus senilis 02/04/2024   Benign neoplasm of skin 02/04/2024   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) 02/04/2024   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   Osteopenia 02/04/2024   Pain of foot 02/04/2024  Pain of right breast 02/04/2024   Phase of life problem 02/04/2024   Acute gastric ulcer without hemorrhage or perforation 01/23/2024   Melena 01/21/2024   Acute anemia 01/21/2024   ABLA (acute blood loss anemia) 01/19/2024   Essential hypertension  01/07/2024   Rectal bleeding 10/07/2023   Nocturnal enuresis 08/25/2023   Hyponatremia 06/10/2023   Abdominal pain 06/10/2023   Diarrhea 06/10/2023   Candidiasis of mouth 05/15/2023   Type 2 diabetes mellitus with hyperglycemia, with long-term current use of insulin  (HCC) 02/12/2023   Long term (current) use of oral hypoglycemic drugs 02/12/2023   Primary hypertension 01/14/2023   Endometrial cancer (HCC) 01/12/2023   Vulvar itching 12/22/2022   Burning with urination 11/13/2022   Vitamin B12 deficiency 11/09/2022   Frequent falls 09/01/2022   Hemorrhoids 03/11/2022   Microalbuminuria 01/21/2022   Tremor, essential 12/11/2021   Chronic bilateral low back pain with right-sided sciatica 07/19/2021   Vaginal itching 04/16/2021   Sensory ataxia 03/28/2021   Elevated troponin 12/14/2020   Chronic post-traumatic stress disorder (PTSD) 12/06/2020   Senile corneal changes 12/06/2020   Postmenopausal bleeding 12/06/2020   Acquired hammer toe 12/06/2020   Opioid dependence 12/06/2020   Hypermetropia 12/06/2020   Generalized weakness 12/06/2020   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   Generalized anxiety disorder 12/06/2020   Functional diarrhea 10/26/2020   Insulin  dependent type 2 diabetes mellitus (HCC) 09/24/2020   Chronic respiratory failure with hypoxia (HCC) 04/25/2020   Chronic constipation 04/25/2020   Chronic pain syndrome 08/22/2019   CAD (coronary artery disease)    Hypocalcemia    S/P shoulder replacement, 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   Mixed hyperlipidemia 05/20/2019   Vertigo 04/25/2019   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   OSA (obstructive sleep apnea) 02/24/2019   At risk for adverse drug event 07/06/2018   Non-alcoholic fatty liver disease 01/12/2018   Mild intermittent asthma 01/12/2018   Senile osteoporosis 12/15/2017   Atrial fibrillation with RVR (HCC) 05/19/2017   Migraine headache with aura 02/12/2016   Diabetic neuropathy (HCC) 02/06/2016   Bipolar 1  disorder (HCC) 10/04/2015   Major depressive disorder 10/03/2015   Herpes genitalis in women 07/16/2015   Long term current use of anticoagulant 05/30/2015   Chronic heart failure with preserved ejection fraction (HFpEF) (HCC) 05/30/2015   RLS (restless legs syndrome) 04/27/2015   Insomnia 01/23/2015   Chronic atrial fibrillation with RVR (HCC) 01/04/2015    PCP: ***  REFERRING PROVIDER: Margrette Taft BRAVO, MD  REFERRING DIAG: R26.9 (ICD-10-CM) - Gait disturbance S80.01XA (ICD-10-CM) - Contusion of right knee, initial encounter M17.11 (ICD-10-CM) - Primary osteoarthritis of right knee  Rationale for Evaluation and Treatment: {HABREHAB:27488}  THERAPY DIAG:  No diagnosis found.  ONSET DATE: ***  SUBJECTIVE:  SUBJECTIVE STATEMENT: ***  PERTINENT HISTORY:  ***  PAIN:  Are you having pain? {OPRCPAIN:27236}  PRECAUTIONS: {Therapy precautions:24002}  RED FLAGS: {PT Red Flags:29287}   WEIGHT BEARING RESTRICTIONS: {Yes ***/No:24003}  FALLS:  Has patient fallen in last 6 months? {fallsyesno:27318}  LIVING ENVIRONMENT: Lives with: {OPRC lives with:25569::lives with their family} Lives in: {Lives in:25570} Stairs: {opstairs:27293} Has following equipment at home: {Assistive devices:23999}  OCCUPATION: ***  PLOF: {PLOF:24004}  PATIENT GOALS: ***  NEXT MD VISIT: ***  OBJECTIVE:  Note: Objective measures were completed at Evaluation unless otherwise noted.  DIAGNOSTIC FINDINGS:  IMPRESSION: 1. No acute fracture or malalignment. 2. Prominent soft tissue swelling anterior to the patella. Moderate circumferential edema extending from the distal thigh to the imaged lower leg. 3. Mild tricompartment arthritis of the knee.  PATIENT SURVEYS:  {rehab  surveys:24030}  COGNITION: Overall cognitive status: {cognition:24006}     SENSATION: {sensation:27233}  MUSCLE LENGTH: Hamstrings: Right *** deg; Left *** deg Debby test: Right *** deg; Left *** deg  POSTURE: {posture:25561}  PALPATION: ***  LUMBAR ROM:   AROM eval  Flexion   Extension   Right lateral flexion   Left lateral flexion   Right rotation   Left rotation    (Blank rows = not tested)  LOWER EXTREMITY ROM:     {AROM/PROM:27142}  Right eval Left eval  Hip flexion    Hip extension    Hip abduction    Hip adduction    Hip internal rotation    Hip external rotation    Knee flexion    Knee extension    Ankle dorsiflexion    Ankle plantarflexion    Ankle inversion    Ankle eversion     (Blank rows = not tested)  LOWER EXTREMITY MMT:    MMT Right eval Left eval  Hip flexion    Hip extension    Hip abduction    Hip adduction    Hip internal rotation    Hip external rotation    Knee flexion    Knee extension    Ankle dorsiflexion    Ankle plantarflexion    Ankle inversion    Ankle eversion     (Blank rows = not tested)  LUMBAR SPECIAL TESTS:  {lumbar special test:25242}  FUNCTIONAL TESTS:  {Functional tests:24029}  GAIT: Distance walked: *** Assistive device utilized: {Assistive devices:23999} Level of assistance: {Levels of assistance:24026} Comments: ***  TREATMENT DATE: ***                                                                                                                                 PATIENT EDUCATION:  Education details: *** Person educated: {Person educated:25204} Education method: {Education Method:25205} Education comprehension: {Education Comprehension:25206}  HOME EXERCISE PROGRAM: ***  ASSESSMENT:  CLINICAL IMPRESSION: Patient is a *** y.o. *** who was seen today for physical therapy evaluation and treatment for ***.   OBJECTIVE IMPAIRMENTS: {opptimpairments:25111}.   ACTIVITY LIMITATIONS:  {  activitylimitations:27494}  PARTICIPATION LIMITATIONS: {participationrestrictions:25113}  PERSONAL FACTORS: {Personal factors:25162} are also affecting patient's functional outcome.   REHAB POTENTIAL: {rehabpotential:25112}  CLINICAL DECISION MAKING: {clinical decision making:25114}  EVALUATION COMPLEXITY: {Evaluation complexity:25115}   GOALS: Goals reviewed with patient? {yes/no:20286}  SHORT TERM GOALS: Target date: ***  *** Baseline: Goal status: INITIAL  2.  *** Baseline:  Goal status: INITIAL  3.  *** Baseline:  Goal status: INITIAL  4.  *** Baseline:  Goal status: INITIAL  5.  *** Baseline:  Goal status: INITIAL  6.  *** Baseline:  Goal status: INITIAL  LONG TERM GOALS: Target date: ***  *** Baseline:  Goal status: INITIAL  2.  *** Baseline:  Goal status: INITIAL  3.  *** Baseline:  Goal status: INITIAL  4.  *** Baseline:  Goal status: INITIAL  5.  *** Baseline:  Goal status: INITIAL  6.  *** Baseline:  Goal status: INITIAL  PLAN:  PT FREQUENCY: {rehab frequency:25116}  PT DURATION: {rehab duration:25117}  PLANNED INTERVENTIONS: {rehab planned interventions:25118::97110-Therapeutic exercises,97530- Therapeutic [id number]- Neuromuscular re-education,97535- Self Rjmz,02859- Manual therapy,Patient/Family education}.  PLAN FOR NEXT SESSION: ***   Kathelyn Gombos E Powell-Butler, PT 02/18/2024, 5:51 PM

## 2024-02-19 ENCOUNTER — Ambulatory Visit (HOSPITAL_COMMUNITY)

## 2024-02-19 ENCOUNTER — Telehealth: Payer: Self-pay | Admitting: Gastroenterology

## 2024-02-19 DIAGNOSIS — I5033 Acute on chronic diastolic (congestive) heart failure: Secondary | ICD-10-CM | POA: Diagnosis not present

## 2024-02-19 LAB — GLUCOSE, CAPILLARY
Glucose-Capillary: 154 mg/dL — ABNORMAL HIGH (ref 70–99)
Glucose-Capillary: 137 mg/dL — ABNORMAL HIGH (ref 70–99)
Glucose-Capillary: 150 mg/dL — ABNORMAL HIGH (ref 70–99)
Glucose-Capillary: 174 mg/dL — ABNORMAL HIGH (ref 70–99)

## 2024-02-19 LAB — BASIC METABOLIC PANEL WITH GFR
Anion gap: 7 (ref 5–15)
BUN: 12 mg/dL (ref 8–23)
CO2: 26 mmol/L (ref 22–32)
Calcium: 7.6 mg/dL — ABNORMAL LOW (ref 8.9–10.3)
Chloride: 99 mmol/L (ref 98–111)
Creatinine, Ser: 0.65 mg/dL (ref 0.44–1.00)
GFR, Estimated: >60 mL/min (ref >60)
Glucose, Bld: 124 mg/dL — ABNORMAL HIGH (ref 70–99)
Potassium: 3.7 mmol/L (ref 3.5–5.1)
Sodium: 132 mmol/L — ABNORMAL LOW (ref 135–145)

## 2024-02-19 MED ORDER — FUROSEMIDE 20 MG PO TABS
20.0000 mg | ORAL_TABLET | Freq: Every evening | ORAL | Status: DC
  Administered 2024-02-19 – 2024-02-24 (×6): 20 mg via ORAL
  Filled 2024-02-19 (×6): qty 1

## 2024-02-19 NOTE — Progress Notes (Signed)
 Progress Note   Patient: Amber Stephenson FMW:[id number] DOB: [birth year] DOA: 02/15/2024     4 DOS: the patient was seen and examined on 02/19/2024   Brief hospital admission narrative:  Amber Stephenson is a 78 y.o. female with medical history significant of endometrial cancer, atrial fibrillation on Eliquis , bipolar disorder type I, chronic pain syndrome, coronary artery disease, chronic kidney disease stage IIIa, type 2 diabetes with nephropathy, hypertension, nonalcoholic fatty liver disease, obstructive sleep apnea, restless leg syndrome and pulmonary hypertension; who presented to the emergency department secondary to melanotic diarrhea, intermittent abdominal pain and increased shortness of breath.  Patient reports symptom has been present for the last 3-4 days and worsening.  No associated nausea, no vomiting, no sick contacts, no changes in her medications.   She was seen by her PCP on the day of admission where she was instructed to presented to the ED for further evaluation, management and possible blood transfusion.   Workup demonstrated hemoglobin of 8, renal function slightly worsened from baseline with a creatinine up to 1.3 and elevated BNP of 664 with a chest x-ray demonstrating cardiomegaly and vascular congestion.   GI service was consulted, GI panel requested and patient started on IV Lasix .  TRH has been consulted to place patient in the hospital for further evaluation and management.    Assessment and plan 1-acute blood loss anemia, melanotic diarrhea, intermittent abdominal pain -Continue as needed analgesics - Continue to hold Eliquis  for now; follow GI service recommendation for clearance to resume it. - Hemoglobin up to 8.5 after 1 unit PRBC transfused on 02/17/24 - Continue PPI twice a day. 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 - Despite clearance/diet advancement patient without appetite. - CT abdomen and pelvis will be repeated in the  setting of left lower quadrant pain and distention appreciated on exam. - Recheck Hgb on 02/20/2024 if stable may restart Eliquis  and if no bleeding after 24 hours of Eliquis  may discharge with outpatient follow-up with GI - GI team would like cardiology consult for preop clearance and for permission to hold Eliquis  prior to EGD which GI plans to do as outpatient unless concerns about ongoing bleeding during this hospitalization -- Eliquis  remains on hold for now  2-acute on chronic diastolic heart failure - Follow echo result - Continue daily weights, strict I's and O's and low-sodium diet - Excellent diuresis with IV Lasix  - Planning to resume oral diuretics and follow response. - Continue to follow renal function and electrolytes -Leg edema/anasarca with anterior abdominal wall edema noted - Continue diuresis =-Give TED stockings Consider giving IV albumin  with IV Lasix   3-hyperkalemia - Resolved after receiving Lokelma  and initiating diuresing - Continue to follow trend intermittently. - Continue telemetry monitoring.  4-depression/anxiety - Continue Lexapro , Abilify  and as needed Xanax .  5-hyperlipidemia - Continue statin.  6-acute kidney injury on chronic kidney disease stage III AA - Appears to be in the setting of decreased perfusion from CHF and ongoing anemia - Creatinine improved after diuresis initiated - 1 unit PRBCs will be transfused - Continue to follow renal function trend - Avoid nephrotoxic agents, hypotension and the use of contrast.  7-hyperlipidemia - Continue statin.  8-type 2 diabetes mellitus - Continue to follow CBG fluctuation - Follow A1c results. - -sliding scale insulin  to cover CBGs above 200. - Patient following diet control as an outpatient.  9-class I obesity -Body mass index is 32.3 kg/m.  -Low-calorie diet and portion control discussed with patient.  10-restless  leg syndrome/chronic pain - Continue treatment with Requip  and home  analgesic regimen. - PTA patient was on OxyContin  20 mg twice daily - Currently receiving IV Dilaudid  0.5 every 6 hours as needed for breakthrough pain  11) chronic hyponatremia--- stable at this time, monitor closely with diuresis  Subjective:  -Reports abdominal discomfort--- required an increase in dose of IV Dilaudid  - Husband and LPN at bedside  Physical Exam: Vitals:   02/18/24 2019 02/19/24 0307 02/19/24 0330 02/19/24 1419  BP: (!) 157/109  125/71 139/68  Pulse: 93   83  Resp: 20  18   Temp: 98 F (36.7 C)  98.2 F (36.8 C) 98.1 F (36.7 C)  TempSrc: Oral  Oral Oral  SpO2: 93%  100% 93%  Weight:  80.1 kg    Height:        Physical Exam  Gen:- Awake Alert, in no acute distress  HEENT:- [place].AT, No sclera icterus Neck-Supple Neck,No JVD,.  Lungs-  CTAB , fair air movement bilaterally  CV- S1, S2 normal, RRR Abd-  +ve B.Sounds, Abd Soft, generalized abdominal discomfort on palpation, anterior abdominal wall pitting edema Extremity/Skin:- +ve  edema,   good pedal pulses  Psych-affect is appropriate, oriented x3 Neuro-generalized weakness, no new focal deficits, no tremors   Family Communication: Husband at bedside.  Disposition: Home Status is: Inpatient  Author: Rendall Carwin, MD 02/19/2024 7:44 PM  For on call review [email or web address].

## 2024-02-19 NOTE — Evaluation (Signed)
 Occupational Therapy Evaluation Patient Details Name: Amber Stephenson MRN: [id number] DOB: 05-Apr-1946 Today's Date: 02/19/2024   History of Present Illness   78 y.o. female admitted on 02/15/24 due to melanotic diarrhea, abdominal pain, and increased SOB. CT negative for abdominal injury. Pt has had multiple falls recently, including a rib fracture. PMH significant for endometrial cancer, atrial fibrillation on Eliquis , bipolar disorder type I, chronic pain syndrome, coronary artery disease, chronic kidney disease stage IIIa, type 2 diabetes with nephropathy, hypertension, nonalcoholic fatty liver disease, obstructive sleep apnea, restless leg syndrome and pulmonary hypertension.     Clinical Impressions Patient admitted for concerns listed above. PTA pt reported that she was fairly independent with BADL's and mobility, however recently had several bad falls. At this time, pt presents with severe pain limiting all mobility. Pt not wanting to get out of bed at this time due to pain. She is able to complete bed mobility overall with min assist and increased pain. BUE appear to have full ROM and good strength overall. Recommending continued skilled OT acutely.      If plan is discharge home, recommend the following:   A little help with walking and/or transfers;A lot of help with bathing/dressing/bathroom;Assistance with cooking/housework     Functional Status Assessment   Patient has had a recent decline in their functional status and demonstrates the ability to make significant improvements in function in a reasonable and predictable amount of time.     Equipment Recommendations   None recommended by OT     Recommendations for Other Services         Precautions/Restrictions   Precautions Precautions: Fall Restrictions Weight Bearing Restrictions Per Provider Order: No     Mobility Bed Mobility Overal bed mobility: Needs Assistance Bed Mobility: Supine to Sit, Sit to  Supine     Supine to sit: Mod assist Sit to supine: Min assist   General bed mobility comments: increased time and effort, assist needed due to severe pain    Transfers                   General transfer comment: refused due to pain      Balance                                           ADL either performed or assessed with clinical judgement   ADL Overall ADL's : Needs assistance/impaired Eating/Feeding: Set up;Sitting   Grooming: Set up;Sitting   Upper Body Bathing: Minimal assistance;Sitting   Lower Body Bathing: Moderate assistance;Sit to/from stand;Sitting/lateral leans   Upper Body Dressing : Set up;Sitting   Lower Body Dressing: Minimal assistance;Moderate assistance;Sitting/lateral leans;Sit to/from stand   Toilet Transfer: Minimal assistance;Stand-pivot   Toileting- Clothing Manipulation and Hygiene: Minimal assistance;Sitting/lateral lean;Sit to/from stand       Functional mobility during ADLs: Minimal assistance;Rolling walker (2 wheels) General ADL Comments: Pain is severely limiting     Vision Baseline Vision/History: 0 No visual deficits Ability to See in Adequate Light: 0 Adequate Patient Visual Report: No change from baseline       Perception         Praxis         Pertinent Vitals/Pain Pain Assessment Pain Assessment: 0-10 Pain Score: 9  Pain Location: abdoment and BLE Pain Descriptors / Indicators: Aching, Constant, Grimacing, Sharp Pain Intervention(s): Limited activity within patient's tolerance, Monitored during  session, Repositioned     Extremity/Trunk Assessment Upper Extremity Assessment Upper Extremity Assessment: Overall WFL for tasks assessed   Lower Extremity Assessment Lower Extremity Assessment: Defer to PT evaluation   Cervical / Trunk Assessment Cervical / Trunk Assessment: Normal   Communication Communication Communication: No apparent difficulties   Cognition Arousal:  Alert Behavior During Therapy: WFL for tasks assessed/performed Cognition: No apparent impairments                               Following commands: Intact       Cueing  General Comments   Cueing Techniques: Verbal cues;Gestural cues  Severe pain, requesting meds from RN   Exercises     Shoulder Instructions      Home Living Family/patient expects to be discharged to:: Private residence Living Arrangements: Spouse/significant other Available Help at Discharge: Family;Available 24 hours/day Type of Home: House Home Access: Stairs to enter [employer] of Steps: 5 Entrance Stairs-Rails: Left Home Layout: Able to live on main level with bedroom/bathroom;Two level     Bathroom Shower/Tub: Producer, television/film/video: Standard Bathroom Accessibility: Yes How Accessible: Accessible via walker Home Equipment: Agricultural consultant (2 wheels);Cane - single point;Shower seat - built in;Grab bars - toilet;Grab bars - tub/shower;Other (comment);Adaptive equipment Adaptive Equipment: Reacher;Long-handled sponge        Prior Functioning/Environment Prior Level of Function : Needs assist;History of Falls (last six months)             Mobility Comments: Pt reports needing to start use of RW ADLs Comments: Independent with all ADLs besides donning socks. iADLs by husband    OT Problem List: Decreased activity tolerance;Impaired balance (sitting and/or standing);Decreased safety awareness;Pain   OT Treatment/Interventions: Self-care/ADL training;Energy conservation;DME and/or AE instruction;Therapeutic activities;Therapeutic exercise      OT Goals(Current goals can be found in the care plan section)   Acute Rehab OT Goals Patient Stated Goal: To decrease pain OT Goal Formulation: With patient Time For Goal Achievement: 03/04/24 Potential to Achieve Goals: Good ADL Goals Pt Will Perform Grooming: with modified independence;standing Pt Will  Perform Lower Body Bathing: with modified independence;sit to/from stand;sitting/lateral leans Pt Will Perform Lower Body Dressing: with modified independence;sit to/from stand;sitting/lateral leans Pt Will Transfer to Toilet: with modified independence;ambulating Pt Will Perform Toileting - Clothing Manipulation and hygiene: with modified independence;sitting/lateral leans;sit to/from stand   OT Frequency:  Min 2X/week    Co-evaluation              AM-PAC OT 6 Clicks Daily Activity     Outcome Measure Help from another person eating meals?: None Help from another person taking care of personal grooming?: A Little Help from another person toileting, which includes using toliet, bedpan, or urinal?: A Lot Help from another person bathing (including washing, rinsing, drying)?: A Lot Help from another person to put on and taking off regular upper body clothing?: A Little Help from another person to put on and taking off regular lower body clothing?: A Lot 6 Click Score: 16   End of Session Nurse Communication: Mobility status  Activity Tolerance: Patient limited by pain Patient left: in bed;with call bell/phone within reach;with family/visitor present  OT Visit Diagnosis: Unsteadiness on feet (R26.81);Other abnormalities of gait and mobility (R26.89);Muscle weakness (generalized) (M62.81)                Time: 8570-8544 OT Time Calculation (min): 26 min Charges:  OT General  Charges $OT Visit: 1 Visit OT Evaluation $OT Eval Low Complexity: 1 Low OT Treatments $Self Care/Home Management : 8-22 mins  [name], OTR/L [facility] Acute Rehab  Vincenta Steffey [name] 02/19/2024, 3:46 PM

## 2024-02-19 NOTE — Progress Notes (Signed)
 PT Cancellation Note  Patient Details Name: Amber Stephenson MRN: [id number] DOB: 06/01/1946   Cancelled Treatment:    Reason Eval/Treat Not Completed: Patient declined, no reason specified Pt declined PT evaluation today as she does not feel up for it and try again tomorrow.   Lacinda Fass, PT, DPT  02/19/2024, 3:35 PM

## 2024-02-19 NOTE — Telephone Encounter (Signed)
 Amber Stephenson/Amber Stephenson - patient recently hospitalized. Please hold off on scheduling EGD until we have gotten medical cardiac clearance, not just approval to hold Eliquis .   Amber Stephenson - please arrange hospital follow up with me in 2-4 weeks.  Amber Stephenson - need CBC in one week. Dx: anemia, melena, history of gastric ulcer.    Amber Melia, MSN, APRN, FNP-BC, AGACNP-BC [facility]

## 2024-02-20 DIAGNOSIS — I5033 Acute on chronic diastolic (congestive) heart failure: Secondary | ICD-10-CM | POA: Diagnosis not present

## 2024-02-20 LAB — CBC
HCT: 27 % — ABNORMAL LOW (ref 36.0–46.0)
Hemoglobin: 8.3 g/dL — ABNORMAL LOW (ref 12.0–15.0)
MCH: 25.4 pg — ABNORMAL LOW (ref 26.0–34.0)
MCHC: 30.7 g/dL (ref 30.0–36.0)
MCV: 82.6 fL (ref 80.0–100.0)
Platelets: 158 K/uL (ref 150–400)
RBC: 3.27 MIL/uL — ABNORMAL LOW (ref 3.87–5.11)
RDW: 19.6 % — ABNORMAL HIGH (ref 11.5–15.5)
WBC: 6.4 K/uL (ref 4.0–10.5)
nRBC: 0 % (ref 0.0–0.2)

## 2024-02-20 LAB — GLUCOSE, CAPILLARY
Glucose-Capillary: 143 mg/dL — ABNORMAL HIGH (ref 70–99)
Glucose-Capillary: 166 mg/dL — ABNORMAL HIGH (ref 70–99)
Glucose-Capillary: 199 mg/dL — ABNORMAL HIGH (ref 70–99)
Glucose-Capillary: 184 mg/dL — ABNORMAL HIGH (ref 70–99)

## 2024-02-20 LAB — RENAL FUNCTION PANEL
Albumin: 2.5 g/dL — ABNORMAL LOW (ref 3.5–5.0)
Anion gap: 11 (ref 5–15)
BUN: 9 mg/dL (ref 8–23)
CO2: 26 mmol/L (ref 22–32)
Calcium: 7.6 mg/dL — ABNORMAL LOW (ref 8.9–10.3)
Chloride: 98 mmol/L (ref 98–111)
Creatinine, Ser: 0.63 mg/dL (ref 0.44–1.00)
GFR, Estimated: >60 mL/min (ref >60)
Glucose, Bld: 126 mg/dL — ABNORMAL HIGH (ref 70–99)
Phosphorus: 3.2 mg/dL (ref 2.5–4.6)
Potassium: 2.8 mmol/L — ABNORMAL LOW (ref 3.5–5.1)
Sodium: 135 mmol/L (ref 135–145)

## 2024-02-20 LAB — URINALYSIS, W/ REFLEX TO CULTURE (INFECTION SUSPECTED)
Bilirubin Urine: NEGATIVE
Glucose, UA: NEGATIVE mg/dL
Ketones, ur: NEGATIVE mg/dL
Nitrite: NEGATIVE
Protein, ur: NEGATIVE mg/dL
RBC / HPF: >50 RBC/hpf (ref 0–5)
Specific Gravity, Urine: 1.004 — ABNORMAL LOW (ref 1.005–1.030)
pH: 6 (ref 5.0–8.0)

## 2024-02-20 LAB — MAGNESIUM: Magnesium: 1 mg/dL — ABNORMAL LOW (ref 1.7–2.4)

## 2024-02-20 MED ORDER — POTASSIUM CHLORIDE CRYS ER 20 MEQ PO TBCR
40.0000 meq | EXTENDED_RELEASE_TABLET | Freq: Once | ORAL | Status: AC
  Administered 2024-02-20: 40 meq via ORAL
  Filled 2024-02-20: qty 2

## 2024-02-20 MED ORDER — ALBUMIN HUMAN 25 % IV SOLN
25.0000 g | Freq: Four times a day (QID) | INTRAVENOUS | Status: AC
Start: 2024-02-20 — End: 2024-02-20
  Administered 2024-02-20 (×3): 25 g via INTRAVENOUS
  Filled 2024-02-20 (×3): qty 100

## 2024-02-20 MED ORDER — BISACODYL 10 MG RE SUPP
10.0000 mg | Freq: Once | RECTAL | Status: AC
Start: 2024-02-20 — End: 2024-02-20
  Administered 2024-02-20: 10 mg via RECTAL
  Filled 2024-02-20: qty 1

## 2024-02-20 MED ORDER — POTASSIUM CHLORIDE CRYS ER 20 MEQ PO TBCR
40.0000 meq | EXTENDED_RELEASE_TABLET | ORAL | Status: AC
  Administered 2024-02-20 (×2): 40 meq via ORAL
  Filled 2024-02-20 (×2): qty 2

## 2024-02-20 MED ORDER — BISACODYL 10 MG RE SUPP: 10.0000 mg | Freq: Once | RECTAL | Status: DC

## 2024-02-20 MED ORDER — APIXABAN 5 MG PO TABS
5.0000 mg | ORAL_TABLET | Freq: Two times a day (BID) | ORAL | Status: DC
  Administered 2024-02-20 (×2): 5 mg via ORAL
  Filled 2024-02-20 (×5): qty 1

## 2024-02-20 MED ORDER — MAGNESIUM SULFATE 4 GM/100ML IV SOLN
4.0000 g | Freq: Once | INTRAVENOUS | Status: AC
  Administered 2024-02-20: 4 g via INTRAVENOUS
  Filled 2024-02-20: qty 100

## 2024-02-20 MED ORDER — SODIUM CHLORIDE 0.9 % IV SOLN
1.0000 g | INTRAVENOUS | Status: DC
  Administered 2024-02-20 – 2024-02-23 (×4): 1 g via INTRAVENOUS
  Filled 2024-02-20 (×4): qty 10

## 2024-02-20 NOTE — Progress Notes (Signed)
 Progress Note  Patient: Amber Stephenson FMW:[id number] DOB: Mar 10, 1946 DOA: 02/15/2024     5 DOS: the patient was seen and examined on 02/20/2024   Brief hospital admission narrative:  Amber Stephenson is a 78 y.o. female with medical history significant of endometrial cancer, atrial fibrillation on Eliquis , bipolar disorder type I, chronic pain syndrome, coronary artery disease, chronic kidney disease stage IIIa, type 2 diabetes with nephropathy, hypertension, nonalcoholic fatty liver disease, obstructive sleep apnea, restless leg syndrome and pulmonary hypertension; who presented to the emergency department secondary to melanotic diarrhea, intermittent abdominal pain and increased shortness of breath.  Patient reports symptom has been present for the last 3-4 days and worsening.  No associated nausea, no vomiting, no sick contacts, no changes in her medications.   She was seen by her PCP on the day of admission where she was instructed to presented to the ED for further evaluation, management and possible blood transfusion.   Workup demonstrated hemoglobin of 8, renal function slightly worsened from baseline with a creatinine up to 1.3 and elevated BNP of 664 with a chest x-ray demonstrating cardiomegaly and vascular congestion.   GI service was consulted, GI panel requested and patient started on IV Lasix .  TRH has been consulted to place patient in the hospital for further evaluation and management.    Assessment and plan 1-acute blood loss anemia, melanotic diarrhea, intermittent abdominal pain -Continue as needed analgesics - Continue to hold Eliquis  for now; follow GI service recommendation for clearance to resume it. - Hemoglobin up to 8.5 after 1 unit PRBC transfused on 02/17/24 - Continue PPI twice a day. 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 - Despite clearance/diet advancement patient without appetite. - CT abdomen and pelvis will be repeated in the  setting of left lower quadrant pain and distention appreciated on exam. - Hgb on 02/20/2024 is stable > 8, restart Eliquis  on 02/20/2024 and if no bleeding on Eliquis  may discharge home with outpatient follow-up with GI - GI team would like cardiology consult for preop clearance and for permission to hold Eliquis  prior to EGD which GI plans to do as outpatient unless concerns about ongoing bleeding during this hospitalization -- Eliquis  restarted on 02/20/2024  2-acute on chronic diastolic heart failure - Echo with EF of 50 to 55%, mild mitral stenosis, no aortic stenosis - Continue daily weights, strict I's and O's and low-sodium diet - Patient has lots of 3rd spacing, c/n diuresis with IV Lasix  with IV albumin  - Continue to follow renal function and electrolytes - Improving leg edema/anasarca with anterior abdominal wall edema noted - Continue diuresis =-Give TED stockings  3-hypokalemia and hypomagnesemia with diuresis--replace and recheck  4-depression/anxiety - Continue Lexapro , Abilify  and as needed Xanax .  5-hyperlipidemia - Continue statin.  6-acute kidney injury - Appears to be in the setting of decreased perfusion from CHF and ongoing anemia - Creatinine improved after diuresis initiated - 1 unit PRBCs will be transfused - Continue to follow renal function trend -Upon further review patient does Not appear to have a history of confirmed CKD per se - Avoid nephrotoxic agents, hypotension and the use of contrast.  7-hyperlipidemia - Continue statin.  8-type 2 diabetes mellitus--A1c is 4.9 reflecting excellent diabetic control PTA - -sliding scale insulin  to cover CBGs above 200. - Patient following diet control as an outpatient.  9-class I obesity -Body mass index is 32.18 kg/m.  -Low-calorie diet and portion control discussed with patient.  10-restless leg syndrome/chronic pain -  Continue treatment with Requip  and home analgesic regimen. - PTA patient was on  OxyContin  20 mg twice daily - Currently receiving IV Dilaudid  0.5 every 6 hours as needed for breakthrough pain  11) chronic hyponatremia--- sodium improved with diuresis  12) possible UTI==== patient reports persistent dysuria - UA suggestive of possible UTI start IV Rocephin  pending urine culture data  Subjective:  - Complains of dysuria No fever  Or chills  Appetite is not great - Husband is at bedside, questions answered  Physical Exam: Vitals:   02/19/24 2003 02/20/24 0500 02/20/24 0635 02/20/24 1405  BP: 138/67  137/68 121/68  Pulse: 89  73 80  Resp:      Temp: 97.8 F (36.6 C)  98.7 F (37.1 C) 98.2 F (36.8 C)  TempSrc: Oral  Oral Oral  SpO2: 96%  95% 92%  Weight:  79.8 kg    Height:        Physical Exam  Gen:- Awake Alert, in no acute distress  HEENT:- [place].AT, No sclera icterus Neck-Supple Neck,No JVD,.  Lungs-  CTAB , fair air movement bilaterally  CV- S1, S2 normal, RRR Abd-  +ve B.Sounds, Abd Soft, generalized abdominal discomfort on palpation, no CVA area tenderness, improving anterior abdominal wall pitting edema Extremity/Skin:-Improving +ve  edema,   good pedal pulses  Psych-affect is appropriate, oriented x3 Neuro-generalized weakness, no new focal deficits, no tremors  Family Communication: Husband at bedside.  Disposition: Home Status is: Inpatient  Author: Rendall Carwin, MD 02/20/2024 4:22 PM  For on call review [email or web address].

## 2024-02-20 NOTE — Plan of Care (Signed)
  Problem: Acute Rehab PT Goals(only PT should resolve) Goal: Pt Will Go Supine/Side To Sit Outcome: Progressing Flowsheets (Taken 02/20/2024 1021) Pt will go Supine/Side to Sit: Independently Goal: Patient Will Transfer Sit To/From Stand Outcome: Progressing Flowsheets (Taken 02/20/2024 1021) Patient will transfer sit to/from stand: with modified independence Goal: Pt Will Transfer Bed To Chair/Chair To Bed Outcome: Progressing Flowsheets (Taken 02/20/2024 1021) Pt will Transfer Bed to Chair/Chair to Bed: with modified independence Goal: Pt Will Ambulate Outcome: Progressing Flowsheets (Taken 02/20/2024 1021) Pt will Ambulate:  25 feet  with least restrictive assistive device  with supervision Goal: Pt Will Go Up/Down Stairs Outcome: Progressing Flowsheets (Taken 02/20/2024 1021) Pt will Go Up / Down Stairs:  3-5 stairs  with rail(s)  with supervision    10:22 AM, 02/20/24 Rosaria Settler, PT, DPT [place] with [facility]

## 2024-02-20 NOTE — Evaluation (Signed)
 Physical Therapy Evaluation Patient Details Name: Amber Stephenson MRN: [id number] DOB: 1945-11-10 Today's Date: 02/20/2024  History of Present Illness  Amber Stephenson is a 78 y.o. female with medical history significant of endometrial cancer, atrial fibrillation on Eliquis , bipolar disorder type I, chronic pain syndrome, coronary artery disease, chronic kidney disease stage IIIa, type 2 diabetes with nephropathy, hypertension, nonalcoholic fatty liver disease, obstructive sleep apnea, restless leg syndrome and pulmonary hypertension; who presented to the emergency department secondary to melanotic diarrhea, intermittent abdominal pain and increased shortness of breath.  Patient reports symptom has been present for the last 3-4 days and worsening.  No associated nausea, no vomiting, no sick contacts, no changes in her medications.   Clinical Impression  Patient agreeable to PT evaluation this date. Patient was received sitting EOB upon PT arrival. Required min assist for sit to supine due to pain but modified independent with supine to sit as well as functional transfers. Supervision/CGA during limited ambulation in room without AD due to inc pain this date and pt history of falls. Patient independent with pericare in seated position and mod independent with STS from toilet with use of railings. Patient did require total assist for donning docks which she reports as baseline. Patient returned to bed due to increased abdominal pain, nursing staff in room upon PT completion. Patient reports husband is available 24/7 and can assist physically as needed. Patient near baseline but slightly more limited due to abdominal pain. Patient will benefit from continued skilled physical therapy acutely and in recommended venue in order to address current deficits to improve overall function, independence, and quality of life.        If plan is discharge home, recommend the following: A little help with walking  and/or transfers;A little help with bathing/dressing/bathroom;Help with stairs or ramp for entrance;Assist for transportation   Can travel by private vehicle        Equipment Recommendations None recommended by PT  Recommendations for Other Services       Functional Status Assessment Patient has had a recent decline in their functional status and demonstrates the ability to make significant improvements in function in a reasonable and predictable amount of time.     Precautions / Restrictions Precautions Precautions: Fall Recall of Precautions/Restrictions: Intact Restrictions Weight Bearing Restrictions Per Provider Order: No      Mobility  Bed Mobility Overal bed mobility: Needs Assistance Bed Mobility: Sit to Supine, Supine to Sit     Supine to sit: Modified independent (Device/Increase time) Sit to supine: Min assist   General bed mobility comments: min assist for sit to supine for LE handling due to pain. Mod Independent for supine to sit, as pt requires inc time due to pain and uses assist from bed railing    Transfers Overall transfer level: Needs assistance   Transfers: Sit to/from Stand, Bed to chair/wheelchair/BSC Sit to Stand: Modified independent (Device/Increase time)   Step pivot transfers: Supervision       General transfer comment: Mod Independent for STS from bed, and recliner for inc time due to pain, mod independent with STS from toilet for inc time as well as grab bar use. no overt LOB of note    Ambulation/Gait Ambulation/Gait assistance: Contact guard assist, Supervision Gait Distance (Feet): 10 Feet Assistive device: None Gait Pattern/deviations: Step-through pattern, Decreased step length - right, Decreased step length - left, Drifts right/left, Decreased stride length Gait velocity: Dec     General Gait Details: Limited to ambulation from  bed to toilet in room due to abdominal pain. Pt demo slow labored movement once on feet. Mildly  unsteady requiring CGA/supervision for safety but no overt LOB  Stairs            Wheelchair Mobility     Tilt Bed    Modified Rankin (Stroke Patients Only)       Balance Overall balance assessment: Needs assistance Sitting-balance support: No upper extremity supported, Feet supported Sitting balance-Leahy Scale: Good Sitting balance - Comments: Seated EOB   Standing balance support: During functional activity, No upper extremity supported Standing balance-Leahy Scale: Fair Standing balance comment: without AD               Pertinent Vitals/Pain Pain Assessment Pain Assessment: 0-10 Pain Score: 7  Pain Location: R side of abdomen Pain Descriptors / Indicators: Aching, Constant, Grimacing, Sharp Pain Intervention(s): Limited activity within patient's tolerance, Premedicated before session, Repositioned    Home Living Family/patient expects to be discharged to:: Private residence Living Arrangements: Spouse/significant other Available Help at Discharge: Family;Available 24 hours/day Type of Home: House Home Access: Stairs to enter Entrance Stairs-Rails: Left Entrance Stairs-Number of Steps: 5   Home Layout: Able to live on main level with bedroom/bathroom;Two level Home Equipment: Agricultural consultant (2 wheels);Cane - single point;Shower seat - built in;Grab bars - toilet;Grab bars - tub/shower;Other (comment);Adaptive equipment Additional Comments: Pt confirms home history from previous admission. reports she had both a walk in and tub at home but uses walk in due to difficulty gettting into tub    Prior Function Prior Level of Function : Needs assist;History of Falls (last six months)       Physical Assist : ADLs (physical);Mobility (physical) Mobility (physical): Gait ADLs (physical): IADLs;Dressing Mobility Comments: Pt reports she does not use AD during gait but reports 2 falls in last 6 months. One being cause of her last admission. Does not do much  walking outside of home, only to car and to appointments ADLs Comments: Independent with all ADLs besides donning socks. iADLs by husband     Extremity/Trunk Assessment   Upper Extremity Assessment Upper Extremity Assessment: Defer to OT evaluation    Lower Extremity Assessment Lower Extremity Assessment: Generalized weakness (Pt generally weak throughout but limited by pain this date most)    Cervical / Trunk Assessment Cervical / Trunk Assessment: Normal  Communication   Communication Communication: No apparent difficulties    Cognition Arousal: Alert Behavior During Therapy: WFL for tasks assessed/performed       Following commands: Intact       Cueing Cueing Techniques: Verbal cues, Visual cues     General Comments      Exercises     Assessment/Plan    PT Assessment Patient needs continued PT services  PT Problem List Decreased strength;Decreased activity tolerance;Decreased balance;Decreased mobility;Pain       PT Treatment Interventions DME instruction;Gait training;Stair training;Functional mobility training;Therapeutic activities;Therapeutic exercise;Balance training;Patient/family education    PT Goals (Current goals can be found in the Care Plan section)  Acute Rehab PT Goals Patient Stated Goal: Return home with HHPT services PT Goal Formulation: With patient Time For Goal Achievement: 02/26/24 Potential to Achieve Goals: Good    Frequency Min 3X/week     Co-evaluation               AM-PAC PT 6 Clicks Mobility  Outcome Measure Help needed turning from your back to your side while in a flat bed without using bedrails?: A Little Help needed  moving from lying on your back to sitting on the side of a flat bed without using bedrails?: A Lot Help needed moving to and from a bed to a chair (including a wheelchair)?: A Little Help needed standing up from a chair using your arms (e.g., wheelchair or bedside chair)?: A Little Help needed to  walk in hospital room?: A Little Help needed climbing 3-5 steps with a railing? : A Little 6 Click Score: 17    End of Session Equipment Utilized During Treatment: Oxygen  Activity Tolerance: Patient limited by pain Patient left: in bed;with nursing/sitter in room;with call bell/phone within reach Nurse Communication: Mobility status PT Visit Diagnosis: Unsteadiness on feet (R26.81);History of falling (Z91.81);Muscle weakness (generalized) (M62.81);Other abnormalities of gait and mobility (R26.89)    Time: 9090-9066 PT Time Calculation (min) (ACUTE ONLY): 24 min   Charges:   PT Evaluation $PT Eval Moderate Complexity: 1 Mod   PT General Charges $$ ACUTE PT VISIT: 1 Visit        10:19 AM, 02/20/24 Rosaria Settler, PT, DPT [place] with [facility]

## 2024-02-20 NOTE — Plan of Care (Signed)

## 2024-02-21 DIAGNOSIS — I5033 Acute on chronic diastolic (congestive) heart failure: Secondary | ICD-10-CM | POA: Diagnosis not present

## 2024-02-21 LAB — HEMOGLOBIN AND HEMATOCRIT, BLOOD
HCT: 26.5 % — ABNORMAL LOW (ref 36.0–46.0)
Hemoglobin: 8.2 g/dL — ABNORMAL LOW (ref 12.0–15.0)

## 2024-02-21 LAB — RENAL FUNCTION PANEL
Albumin: 3.4 g/dL — ABNORMAL LOW (ref 3.5–5.0)
Anion gap: 10 (ref 5–15)
BUN: 9 mg/dL (ref 8–23)
CO2: 29 mmol/L (ref 22–32)
Calcium: 8.1 mg/dL — ABNORMAL LOW (ref 8.9–10.3)
Chloride: 97 mmol/L — ABNORMAL LOW (ref 98–111)
Creatinine, Ser: 0.56 mg/dL (ref 0.44–1.00)
GFR, Estimated: >60 mL/min (ref >60)
Glucose, Bld: 155 mg/dL — ABNORMAL HIGH (ref 70–99)
Phosphorus: 2.6 mg/dL (ref 2.5–4.6)
Potassium: 3.4 mmol/L — ABNORMAL LOW (ref 3.5–5.1)
Sodium: 136 mmol/L (ref 135–145)

## 2024-02-21 LAB — GLUCOSE, CAPILLARY
Glucose-Capillary: 141 mg/dL — ABNORMAL HIGH (ref 70–99)
Glucose-Capillary: 206 mg/dL — ABNORMAL HIGH (ref 70–99)
Glucose-Capillary: 224 mg/dL — ABNORMAL HIGH (ref 70–99)
Glucose-Capillary: 275 mg/dL — ABNORMAL HIGH (ref 70–99)

## 2024-02-21 MED ORDER — ORAL CARE MOUTH RINSE: 15.0000 mL | OROMUCOSAL | Status: DC | PRN

## 2024-02-21 MED ORDER — POTASSIUM CHLORIDE CRYS ER 20 MEQ PO TBCR
40.0000 meq | EXTENDED_RELEASE_TABLET | ORAL | Status: AC
  Administered 2024-02-21 (×2): 40 meq via ORAL
  Filled 2024-02-21 (×2): qty 2

## 2024-02-21 NOTE — Plan of Care (Signed)
  Problem: Education: Goal: Ability to describe self-care measures that may prevent or decrease complications (Diabetes Survival Skills Education) will improve Outcome: Progressing   Problem: Skin Integrity: Goal: Risk for impaired skin integrity will decrease Outcome: Progressing   Problem: Education: Goal: Knowledge of General Education information will improve Description: Including pain rating scale, medication(s)/side effects and non-pharmacologic comfort measures Outcome: Progressing   Problem: Pain Managment: Goal: General experience of comfort will improve and/or be controlled Outcome: Progressing

## 2024-02-21 NOTE — Plan of Care (Signed)
  Problem: Fluid Volume: Goal: Ability to maintain a balanced intake and output will improve Outcome: Progressing   Problem: Nutritional: Goal: Maintenance of adequate nutrition will improve Outcome: Progressing   Problem: Nutritional: Goal: Maintenance of adequate nutrition will improve Outcome: Progressing   Problem: Nutritional: Goal: Maintenance of adequate nutrition will improve Outcome: Progressing   Problem: Education: Goal: Knowledge of General Education information will improve Description: Including pain rating scale, medication(s)/side effects and non-pharmacologic comfort measures Outcome: Progressing

## 2024-02-21 NOTE — Progress Notes (Addendum)
 NT reported to this writer that while ambulating patient to assist patient to bathroom NT noted that patient had passed a blood clot and had some bleeding after using the bathroom. MD Courage made aware. NT then came to this writer after patient had voided again and this writer noted dark colored blood. MD Courage aware.This Clinical research associate and MD Courage at bedside, rectal exam requested per MD Courage. Consent obtained by patient with husband at bedside. After rectal exam was completed, noted dark blood on glove and bright red blood in peri-anal area.

## 2024-02-21 NOTE — Progress Notes (Addendum)
 Progress Note  Patient: Amber Stephenson FMW:[id number] DOB: 10-Apr-1946 DOA: 02/15/2024     6 DOS: the patient was seen and examined on 02/21/2024   Brief hospital admission narrative:  HANSIKA LEAMING is a 78 y.o. female with medical history significant of endometrial cancer, atrial fibrillation on Eliquis , bipolar disorder type I, chronic pain syndrome, coronary artery disease, chronic kidney disease stage IIIa, type 2 diabetes with nephropathy, hypertension, nonalcoholic fatty liver disease, obstructive sleep apnea, restless leg syndrome and pulmonary hypertension; who presented to the emergency department secondary to melanotic diarrhea, intermittent abdominal pain and increased shortness of breath.  Patient reports symptom has been present for the last 3-4 days and worsening.  No associated nausea, no vomiting, no sick contacts, no changes in her medications.   She was seen by her PCP on the day of admission where she was instructed to presented to the ED for further evaluation, management and possible blood transfusion.   Workup demonstrated hemoglobin of 8, renal function slightly worsened from baseline with a creatinine up to 1.3 and elevated BNP of 664 with a chest x-ray demonstrating cardiomegaly and vascular congestion.   GI service was consulted, GI panel requested and patient started on IV Lasix .  TRH has been consulted to place patient in the hospital for further evaluation and management.    Assessment and plan 1-acute blood loss anemia, melanotic diarrhea, intermittent abdominal pain - Hemoglobin > 8 after 1 unit PRBC transfused on 02/17/24 - Continue PPI twice a day. 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 - Despite clearance/diet advancement patient without appetite. - CT abdomen and pelvis will be repeated in the setting of left lower quadrant pain and distention appreciated on exam. -Eliquis  had been on hold for over 5 days -- Eliquis  restarted on  02/20/2024 x 1 day only (2 doses) , pt developed rectal bleeding in a.m.-01/21/2024 -Rectal Exam--performed by LPN Tera Isley on 02/21/24---dark blood on exam glove and bright red blood in peri-anal area -- No further Eliquis  at this time (last dose of Eliquis  02/21/24), - Discussed with Dr. Eartha, anticipate colonoscopy and possible EGD on 02/23/2024  2-acute on chronic diastolic heart failure - Echo with EF of 50 to 55%, mild mitral stenosis, no aortic stenosis - Continue daily weights, strict I's and O's and low-sodium diet - Patient has lots of 3rd spacing, c/n diuresis with IV Lasix  with IV albumin  - Continue to follow renal function and electrolytes - Improving leg edema/anasarca with anterior abdominal wall edema noted - Continue diuresis =-use TED stockings  3-hypokalemia and hypomagnesemia with diuresis--replace and recheck  4-depression/anxiety - Continue Lexapro , Abilify  and as needed Xanax .  5-hyperlipidemia - Continue statin.  6-acute kidney injury - Appears to be in the setting of decreased perfusion from CHF and ongoing anemia - Creatinine improved after diuresis initiated - Continue to follow renal function trend -Upon further review patient does Not appear to have a history of confirmed CKD per se - Avoid nephrotoxic agents, hypotension and the use of contrast.  7-hyperlipidemia - Continue statin.  8-type 2 diabetes mellitus--A1c is 4.9 reflecting excellent diabetic control PTA - -sliding scale insulin  to cover CBGs above 200. - Patient following diet control as an outpatient.  9-class I obesity -Body mass index is 31.37 kg/m.  -Low-calorie diet and portion control discussed with patient.  10-restless leg syndrome/chronic pain - Continue treatment with Requip  and home analgesic regimen. - PTA patient was on OxyContin  20 mg twice daily - Currently receiving IV Dilaudid   0.5 every 6 hours as needed for breakthrough pain  11) chronic hyponatremia--- sodium  improved with diuresis  12)Klebsiella UTI==== patient reports persistent dysuria - UA suggestive of possible UTI start IV Rocephin  pending urine culture data  Subjective:   No fever  Or chills  Appetite is not great - Husband is at bedside, questions answered - Concerns about rectal bleeding this morning--- -myself and patient's primary nurse Tera at bedside- Rectal Exam--performed by LPN Roberts Arenas on 02/21/24---dark blood on exam glove and bright red blood in peri-anal area  Physical Exam: Vitals:   02/21/24 0456 02/21/24 0500 02/21/24 0824 02/21/24 1348  BP: (!) 130/91  113/74 (!) 147/77  Pulse: 85  89 75  Resp:      Temp: 98.4 F (36.9 C)  97.7 F (36.5 C) 98.7 F (37.1 C)  TempSrc: Oral  Axillary Axillary  SpO2: 95%  93% 98%  Weight:  77.8 kg    Height:        Physical Exam  Gen:- Awake Alert, in no acute distress  HEENT:- [place].AT, No sclera icterus Neck-Supple Neck,No JVD,.  Lungs-  CTAB , fair air movement bilaterally  CV- S1, S2 normal, RRR Abd-  +ve B.Sounds, Abd Soft, generalized abdominal discomfort on palpation, no CVA area tenderness, improving anterior abdominal wall pitting edema Extremity/Skin:-Improving +ve  edema,   good pedal pulses  Psych-affect is appropriate, oriented x3 Neuro-generalized weakness, no new focal deficits, no tremors Rectal Exam--performed by LPN Tera Isley on 02/21/24---dark blood on exam glove and bright red blood in peri-anal area  Family Communication: Husband at bedside.  Disposition: Home Status is: Inpatient  Author: Rendall Carwin, MD 02/21/2024 6:00 PM  For on call review [email or web address].

## 2024-02-22 ENCOUNTER — Ambulatory Visit: Admitting: Family Medicine

## 2024-02-22 ENCOUNTER — Telehealth: Payer: Self-pay | Admitting: *Deleted

## 2024-02-22 DIAGNOSIS — Z7901 Long term (current) use of anticoagulants: Secondary | ICD-10-CM | POA: Diagnosis not present

## 2024-02-22 DIAGNOSIS — K625 Hemorrhage of anus and rectum: Secondary | ICD-10-CM

## 2024-02-22 DIAGNOSIS — R197 Diarrhea, unspecified: Secondary | ICD-10-CM | POA: Diagnosis not present

## 2024-02-22 DIAGNOSIS — R109 Unspecified abdominal pain: Secondary | ICD-10-CM

## 2024-02-22 DIAGNOSIS — I5033 Acute on chronic diastolic (congestive) heart failure: Secondary | ICD-10-CM | POA: Diagnosis not present

## 2024-02-22 LAB — CBC
HCT: 27 % — ABNORMAL LOW (ref 36.0–46.0)
Hemoglobin: 8.3 g/dL — ABNORMAL LOW (ref 12.0–15.0)
MCH: 26.2 pg (ref 26.0–34.0)
MCHC: 30.7 g/dL (ref 30.0–36.0)
MCV: 85.2 fL (ref 80.0–100.0)
Platelets: 151 K/uL (ref 150–400)
RBC: 3.17 MIL/uL — ABNORMAL LOW (ref 3.87–5.11)
RDW: 19.9 % — ABNORMAL HIGH (ref 11.5–15.5)
WBC: 7 K/uL (ref 4.0–10.5)
nRBC: 0 % (ref 0.0–0.2)

## 2024-02-22 LAB — URINE CULTURE: Culture: >=100000 — AB

## 2024-02-22 LAB — BASIC METABOLIC PANEL WITH GFR
Anion gap: 5 (ref 5–15)
BUN: 10 mg/dL (ref 8–23)
CO2: 28 mmol/L (ref 22–32)
Calcium: 7.4 mg/dL — ABNORMAL LOW (ref 8.9–10.3)
Chloride: 98 mmol/L (ref 98–111)
Creatinine, Ser: 0.56 mg/dL (ref 0.44–1.00)
GFR, Estimated: >60 mL/min (ref >60)
Glucose, Bld: 172 mg/dL — ABNORMAL HIGH (ref 70–99)
Potassium: 4.1 mmol/L (ref 3.5–5.1)
Sodium: 131 mmol/L — ABNORMAL LOW (ref 135–145)

## 2024-02-22 LAB — GLUCOSE, CAPILLARY
Glucose-Capillary: 183 mg/dL — ABNORMAL HIGH (ref 70–99)
Glucose-Capillary: 201 mg/dL — ABNORMAL HIGH (ref 70–99)
Glucose-Capillary: 230 mg/dL — ABNORMAL HIGH (ref 70–99)
Glucose-Capillary: 164 mg/dL — ABNORMAL HIGH (ref 70–99)

## 2024-02-22 MED ORDER — BISACODYL 5 MG PO TBEC
10.0000 mg | DELAYED_RELEASE_TABLET | ORAL | Status: AC
  Administered 2024-02-22: 10 mg via ORAL
  Filled 2024-02-22: qty 2

## 2024-02-22 MED ORDER — PANTOPRAZOLE SODIUM 40 MG PO TBEC
40.0000 mg | DELAYED_RELEASE_TABLET | Freq: Two times a day (BID) | ORAL | Status: DC
  Administered 2024-02-22 – 2024-02-25 (×6): 40 mg via ORAL
  Filled 2024-02-22 (×6): qty 1

## 2024-02-22 MED ORDER — PEG 3350-KCL-NA BICARB-NACL 420 G PO SOLR
4000.0000 mL | Freq: Once | ORAL | Status: AC
  Administered 2024-02-22: 4000 mL via ORAL

## 2024-02-22 NOTE — Progress Notes (Addendum)
 Gastroenterology Progress Note    Primary Care Physician:  Amber Lowers, MD Primary Gastroenterologist:  Dr. Shaaron   Patient ID: Amber Stephenson; [id number]; 1945-11-07    Subjective   Intermittent right-sided abdominal pain, ongoing for about a week. No postprandial abdominal pain. Worse upon waking. Not worsening this admission. No diarrhea. BM yesterday.  Eliquis  resumed on 9/20, receiving 2 doses. Had recurrent rectal bleeding again noted yesterday evening. Dark red blood and blood clot after BM.    Objective   Vital signs in last 24 hours Temp:  [98 F (36.7 C)-101.3 F (38.5 C)] 98 F (36.7 C) (09/22 0615) Pulse Rate:  [69-100] 69 (09/22 0615) Resp:  [15-20] 15 (09/22 0615) BP: (124-157)/(59-77) 124/59 (09/22 0615) SpO2:  [96 %-100 %] 96 % (09/22 0615) Weight:  [75.8 kg] 75.8 kg (09/22 0615) Last BM Date : 02/21/24  Physical Exam General:   Alert and oriented, pleasant Head:  Normocephalic and atraumatic. Eyes:  No icterus, sclera clear. Conjuctiva pink.  Abdomen:  Bowel sounds present, soft, mild TTP lower abdomen, no rebound or guarding Neurologic:  Alert and  oriented x4  Intake/Output from previous day: 09/21 0701 - 09/22 0700 In: 600 [P.O.:600] Out: 1650 [Urine:1650] Intake/Output this shift: No intake/output data recorded.  Lab Results  Recent Labs    02/20/24 0500 02/21/24 0356 02/22/24 0509  WBC 6.4  --  7.0  HGB 8.3* 8.2* 8.3*  HCT 27.0* 26.5* 27.0*  PLT 158  --  151   BMET Recent Labs    02/20/24 0500 02/21/24 0356 02/22/24 0509  NA 135 136 131*  K 2.8* 3.4* 4.1  CL 98 97* 98  CO2 26 29 28   GLUCOSE 126* 155* 172*  BUN 9 9 10   CREATININE 0.63 0.56 0.56  CALCIUM  7.6* 8.1* 7.4*   LFT Recent Labs    02/20/24 0500 02/21/24 0356  ALBUMIN  2.5* 3.4*    Studies/Results CT ABDOMEN PELVIS WO CONTRAST Result Date: 02/19/2024 CLINICAL DATA:  Left lower quadrant abdominal pain and diarrhea. EXAM: CT ABDOMEN AND PELVIS WITHOUT  CONTRAST TECHNIQUE: Multidetector CT imaging of the abdomen and pelvis was performed following the standard protocol without IV contrast. RADIATION DOSE REDUCTION: This exam was performed according to the departmental dose-optimization program which includes automated exposure control, adjustment of the mA and/or kV according to patient size and/or use of iterative reconstruction technique. COMPARISON:  02/15/2024 FINDINGS: Lower chest: Interstitial and patchy alveolar ground-glass opacity is identified in both lung bases, similar to minimally progressive in the interval. No substantial pleural effusion. Hepatobiliary: No suspicious focal abnormality in the liver on this study without intravenous contrast. There is no evidence for gallstones, gallbladder wall thickening, or pericholecystic fluid. No intrahepatic or extrahepatic biliary dilation. Pancreas: No focal mass lesion. No dilatation of the main duct. No intraparenchymal cyst. No peripancreatic edema. Spleen: No splenomegaly. No suspicious focal mass lesion. Adrenals/Urinary Tract: Right adrenal gland unremarkable. 11 mm low-density left adrenal nodule stable since prior and comparing back to a study from 2022 compatible with benign etiology, likely adenoma. Kidneys unremarkable. No evidence for hydroureter. The urinary bladder appears normal for the degree of distention. Stomach/Bowel: Stomach is unremarkable. No gastric wall thickening. No evidence of outlet obstruction. Duodenum is normally positioned as is the ligament of Treitz. Duodenal diverticulum noted. No small bowel wall thickening. No small bowel dilatation. The terminal ileum is normal. The appendix is normal. No gross colonic mass. No colonic wall thickening. Vascular/Lymphatic: There is mild atherosclerotic calcification of the abdominal  aorta without aneurysm. There is no gastrohepatic or hepatoduodenal ligament lymphadenopathy. No retroperitoneal or mesenteric lymphadenopathy. No pelvic  sidewall lymphadenopathy. Reproductive: The uterus is unremarkable.  There is no adnexal mass. Other: No intraperitoneal free fluid. Musculoskeletal: Small right groin hernia contains only fat. Diffuse body wall edema evident. No worrisome lytic or sclerotic osseous abnormality. IMPRESSION: 1. No acute findings in the abdomen or pelvis. Specifically, no findings to explain the patient's history of left lower quadrant pain and diarrhea. 2. Interstitial and patchy alveolar ground-glass opacity in both lung bases, similar to minimally progressive in the interval. Imaging features may reflect edema although infectious/inflammatory etiology not excluded. 3. Diffuse body wall edema. 4. Small right groin hernia contains only fat. 5.  Aortic Atherosclerosis (ICD10-I70.0). Electronically Signed   By: Camellia Candle M.D.   On: 02/19/2024 09:54   ECHOCARDIOGRAM COMPLETE Result Date: 02/17/2024    ECHOCARDIOGRAM REPORT   Patient Name:   Amber Stephenson Date of Exam: 02/17/2024 Medical Rec #:  [id number]         Height:       62.0 in Accession #:    [id number]        Weight:       182.8 lb Date of Birth:  03/29/1946          BSA:          1.840 m Patient Age:    78 years          BP:           140/86 mmHg Patient Gender: F                 HR:           76 bpm. Exam Location:  [facility] Procedure: 2D Echo, Color Doppler and Cardiac Doppler (Both Spectral and Color            Flow Doppler were utilized during procedure). Indications:    CHF I50.31  History:        Patient has prior history of Echocardiogram examinations, most                 recent 12/14/2020. CHF and Cardiomegaly, Arrythmias:Atrial                 Fibrillation; Risk Factors:Diabetes and Dyslipidemia. H/O                 Obstructive sleep apnea, Hypotension, Hypertension.  Sonographer:    Amber Stephenson Referring Phys: Amber Stephenson Amber Stephenson IMPRESSIONS  1. Left ventricular ejection fraction, by estimation, is 50 to 55%. The left ventricle has low normal function.  Left ventricular endocardial border not optimally defined to evaluate regional wall motion. Left ventricular diastolic function could not be evaluated.  2. Right ventricular systolic function is moderately reduced. The right ventricular size is moderately enlarged. There is normal pulmonary artery systolic pressure. The estimated right ventricular systolic pressure is 28.8 mmHg.  3. Left atrial size was severely dilated.  4. Right atrial size was severely dilated.  5. The mitral valve is degenerative. Mild mitral valve regurgitation. Mild mitral stenosis. The mean mitral valve gradient is 4.0 mmHg. Severe mitral annular calcification.  6. The aortic valve is tricuspid. Aortic valve regurgitation is mild. Aortic valve sclerosis/calcification is present, without any evidence of aortic stenosis. Aortic regurgitation PHT measures 924 msec. Aortic valve area, by VTI measures 2.58 cm. Aortic valve mean gradient measures 2.0 mmHg. Aortic valve Vmax measures 1.09 m/s.  7. The  inferior vena cava is dilated in size with <50% respiratory variability, suggesting right atrial pressure of 15 mmHg.  8. Recommend repeat limited study with definity contrast to evaluate LVF and wall motion more accurately FINDINGS  Left Ventricle: Left ventricular ejection fraction, by estimation, is 50 to 55%. The left ventricle has low normal function. Left ventricular endocardial border not optimally defined to evaluate regional wall motion. The left ventricular internal cavity  size was normal in size. There is no left ventricular hypertrophy. Left ventricular diastolic function could not be evaluated due to atrial fibrillation. Left ventricular diastolic function could not be evaluated. Right Ventricle: The right ventricular size is moderately enlarged. No increase in right ventricular wall thickness. Right ventricular systolic function is moderately reduced. There is normal pulmonary artery systolic pressure. The tricuspid regurgitant velocity  is 1.86 m/s, and with an assumed right atrial pressure of 15 mmHg, the estimated right ventricular systolic pressure is 28.8 mmHg. Left Atrium: Left atrial size was severely dilated. Right Atrium: Right atrial size was severely dilated. Pericardium: There is no evidence of pericardial effusion. Mitral Valve: The mitral valve is degenerative in appearance. There is mild thickening of the mitral valve leaflet(s). There is mild calcification of the mitral valve leaflet(s). Severe mitral annular calcification. Mild mitral valve regurgitation. Mild mitral valve stenosis. MV peak gradient, 11.3 mmHg. The mean mitral valve gradient is 4.0 mmHg. Tricuspid Valve: The tricuspid valve is normal in structure. Tricuspid valve regurgitation is trivial. No evidence of tricuspid stenosis. Aortic Valve: The aortic valve is tricuspid. Aortic valve regurgitation is mild. Aortic regurgitation PHT measures 924 msec. Aortic valve sclerosis/calcification is present, without any evidence of aortic stenosis. Aortic valve mean gradient measures 2.0  mmHg. Aortic valve peak gradient measures 4.8 mmHg. Aortic valve area, by VTI measures 2.58 cm. Pulmonic Valve: The pulmonic valve was normal in structure. Pulmonic valve regurgitation is trivial. No evidence of pulmonic stenosis. Aorta: The aortic root is normal in size and structure. Venous: The inferior vena cava is dilated in size with less than 50% respiratory variability, suggesting right atrial pressure of 15 mmHg. IAS/Shunts: No atrial level shunt detected by color flow Doppler.  LEFT VENTRICLE PLAX 2D LVIDd:         4.60 cm      Diastology LVIDs:         3.60 cm      LV e' medial:    7.62 cm/s LV PW:         1.10 cm      LV E/e' medial:  21.4 LV IVS:        1.10 cm      LV e' lateral:   11.30 cm/s LVOT diam:     2.10 cm      LV E/e' lateral: 14.4 LV SV:         51 LV SV Index:   28 LVOT Area:     3.46 cm  LV Volumes (MOD) LV vol d, MOD A2C: 127.0 ml LV vol d, MOD A4C: 105.0 ml LV vol  s, MOD A2C: 57.7 ml LV vol s, MOD A4C: 44.3 ml LV SV MOD A2C:     69.3 ml LV SV MOD A4C:     105.0 ml LV SV MOD BP:      65.9 ml RIGHT VENTRICLE            IVC RV Basal diam:  4.60 cm    IVC diam: 2.80 cm RV S prime:  6.65 cm/s TAPSE (M-mode): 1.0 cm LEFT ATRIUM              Index        RIGHT ATRIUM           Index LA diam:        5.20 cm  2.83 cm/m   RA Area:     28.50 cm LA Vol (A2C):   139.0 ml 75.55 ml/m  RA Volume:   94.40 ml  51.31 ml/m LA Vol (A4C):   126.0 ml 68.48 ml/m LA Biplane Vol: 134.0 ml 72.83 ml/m  AORTIC VALVE                    PULMONIC VALVE AV Area (Vmax):    2.26 cm     PV Vmax:          0.76 m/s AV Area (Vmean):   2.27 cm     PV Peak grad:     2.3 mmHg AV Area (VTI):     2.58 cm     PR End Diast Vel: 6.22 msec AV Vmax:           109.00 cm/s AV Vmean:          68.400 cm/s AV VTI:            0.199 m AV Peak Grad:      4.8 mmHg AV Mean Grad:      2.0 mmHg LVOT Vmax:         71.00 cm/s LVOT Vmean:        44.900 cm/s LVOT VTI:          0.148 m LVOT/AV VTI ratio: 0.74 AI PHT:            924 msec  AORTA Ao Root diam: 3.60 cm Ao Asc diam:  3.70 cm MITRAL VALVE                TRICUSPID VALVE MV Area (PHT): 5.13 cm     TR Peak grad:   13.8 mmHg MV Area VTI:   1.59 cm     TR Vmax:        186.00 cm/s MV Peak grad:  11.3 mmHg MV Mean grad:  4.0 mmHg     SHUNTS MV Vmax:       1.68 m/s     Systemic VTI:  0.15 m MV Vmean:      85.8 cm/s    Systemic Diam: 2.10 cm MV Decel Time: 148 msec MR Peak grad: 80.6 mmHg MR Vmax:      449.00 cm/s MV E velocity: 163.00 cm/s MV A velocity: 59.40 cm/s MV E/A ratio:  2.74 Wilbert Bihari MD Electronically signed by Wilbert Bihari MD Signature Date/Time: 02/17/2024/2:01:15 PM    Final    CT ABDOMEN PELVIS WO CONTRAST Result Date: 02/15/2024 CLINICAL DATA:  Right lower quadrant abdominal pain.  Diarrhea. EXAM: CT ABDOMEN AND PELVIS WITHOUT CONTRAST TECHNIQUE: Multidetector CT imaging of the abdomen and pelvis was performed following the standard protocol without IV  contrast. RADIATION DOSE REDUCTION: This exam was performed according to the departmental dose-optimization program which includes automated exposure control, adjustment of the mA and/or kV according to patient size and/or use of iterative reconstruction technique. COMPARISON:  01/19/2024 FINDINGS: Lower chest: Substantial cardiomegaly. Low-density blood pool pool favoring anemia. Dense mitral valve calcification. Mild aortic valve calcification. Mosaic attenuation at the lung bases with some reticular interstitial accentuation, similar to the prior exam. Faint nodularity  inferiorly in the right middle lobe likewise similar to the prior exam. Hepatobiliary: Unremarkable Pancreas: Unremarkable Spleen: Unremarkable Adrenals/Urinary Tract: Stable appearance of the adrenal glands and kidneys, no hydronephrosis or hydroureter. Borderline urinary bladder wall thickening, cystitis not excluded, correlate with urine analysis. Stomach/Bowel: Proximal transverse duodenal diverticulum. Normal appendix. No dilated bowel to indicate obstruction. Numerous jejunal diverticular present. None of these appear overtly inflamed. Equivocal wall thickening in the descending and sigmoid colon, much of the appearance may be from nondistention rather than necessarily from colitis/inflammation. Vascular/Lymphatic: Atherosclerosis is present, including aortoiliac atherosclerotic disease. Atheromatous plaque at the origins of the celiac trunk and SMA. Patency not assessed on today's noncontrast examination. No pathologic adenopathy observed. Reproductive: Unremarkable Other: Small amount of ascites mainly along the left paracolic gutter. Presacral edema. Musculoskeletal: Subcutaneous edema in the anterior pannus, cannot exclude panniculitis. Right groin hernia contains adipose tissue. Notable subcutaneous edema also present laterally along the right upper thigh. Rib deformities associated with healing left anterior fifth and sixth rib  fractures. IMPRESSION: 1. Equivocal wall thickening in the descending and sigmoid colon, much of the appearance may be from nondistention rather than necessarily from colitis/inflammation. 2. Small amount of ascites mainly along the left paracolic gutter. 3. Subcutaneous edema in the anterior pannus, cannot exclude panniculitis. 4. Substantial cardiomegaly. Low-density blood pool favoring anemia. 5. Nonspecific mosaic attenuation at the lung bases with some reticular interstitial accentuation, similar to the prior exam. Faint nodularity inferiorly in the right middle lobe likewise similar to the prior exam. 6. Right groin hernia contains adipose tissue. 7. Notable subcutaneous edema laterally along the right upper thigh. 8. Healing left anterior fifth and sixth rib fractures. 9.  Aortic Atherosclerosis (ICD10-I70.0). Electronically Signed   By: Ryan Salvage M.D.   On: 02/15/2024 17:34   DG Chest Port 1 View Result Date: 02/15/2024 CLINICAL DATA:  Pain EXAM: PORTABLE CHEST 1 VIEW COMPARISON:  01/21/2024 FINDINGS: Cardiomegaly, vascular congestion. Interstitial prominence may reflect interstitial edema. No effusions or acute bony abnormality. IMPRESSION: Continued interstitial edema pattern, not significantly changed since prior study. Electronically Signed   By: Franky Crease M.D.   On: 02/15/2024 13:44   US  Venous Img Lower Unilateral Left Result Date: 02/15/2024 CLINICAL DATA:  Left lower extremity edema EXAM: LEFT LOWER EXTREMITY VENOUS DOPPLER ULTRASOUND TECHNIQUE: Gray-scale sonography with compression, as well as color and duplex ultrasound, were performed to evaluate the deep venous system(s) from the level of the common femoral vein through the popliteal and proximal calf veins. COMPARISON:  None Available. FINDINGS: VENOUS Normal compressibility of the common femoral, superficial femoral, and popliteal veins, as well as the visualized calf veins. Visualized portions of profunda femoral vein and  great saphenous vein unremarkable. No filling defects to suggest DVT on grayscale or color Doppler imaging. Doppler waveforms show normal direction of venous flow, normal respiratory plasticity and response to augmentation. Limited views of the contralateral common femoral vein are unremarkable. OTHER Superficial subcutaneous edema along the left calf. Limitations: none IMPRESSION: 1. No evidence of acute DVT. 2. Superficial subcutaneous edema may reflect volume overload or cellulitis. Electronically Signed   By: Wilkie Lent M.D.   On: 02/15/2024 13:32   CT Knee Right Wo Contrast Result Date: 01/29/2024 CLINICAL DATA:  Knee trauma EXAM: CT OF THE RIGHT KNEE WITHOUT CONTRAST TECHNIQUE: Multidetector CT imaging of the right knee was performed according to the standard protocol. Multiplanar CT image reconstructions were also generated. RADIATION DOSE REDUCTION: This exam was performed according to the departmental dose-optimization program which  includes automated exposure control, adjustment of the mA and/or kV according to patient size and/or use of iterative reconstruction technique. COMPARISON:  01/29/2024 FINDINGS: Bones/Joint/Cartilage No acute fracture or malalignment. Mild tricompartment arthritis of the knee with mild joint space narrowing and slight spurring. No sizable knee effusion. Ligaments Suboptimally assessed by CT. Muscles and Tendons No intramuscular fluid collections. Soft tissues Prominent soft tissue swelling anterior to the patella. Moderate circumferential edema extending from the distal thigh to the imaged lower leg. IMPRESSION: 1. No acute fracture or malalignment. 2. Prominent soft tissue swelling anterior to the patella. Moderate circumferential edema extending from the distal thigh to the imaged lower leg. 3. Mild tricompartment arthritis of the knee. Electronically Signed   By: Luke Bun M.D.   On: 01/29/2024 21:39   CT Cervical Spine Wo Contrast Result Date:  01/29/2024 CLINICAL DATA:  Trauma fall EXAM: CT CERVICAL SPINE WITHOUT CONTRAST TECHNIQUE: Multidetector CT imaging of the cervical spine was performed without intravenous contrast. Multiplanar CT image reconstructions were also generated. RADIATION DOSE REDUCTION: This exam was performed according to the departmental dose-optimization program which includes automated exposure control, adjustment of the mA and/or kV according to patient size and/or use of iterative reconstruction technique. COMPARISON:  CT 01/12/2024 FINDINGS: Alignment: Mild reversal of cervical lordosis. Facet alignment is within normal limits. Skull base and vertebrae: No acute fracture. No primary bone lesion or focal pathologic process. Soft tissues and spinal canal: No prevertebral fluid or swelling. No visible canal hematoma. Disc levels: Multilevel degenerative change. Mild to moderate diffuse disc space narrowing C4 through C7. Facet degenerative changes at multiple levels with foraminal narrowing. Upper chest: Negative. Other: None IMPRESSION: Mild reversal of cervical lordosis with degenerative changes. No acute osseous abnormality. Electronically Signed   By: Luke Bun M.D.   On: 01/29/2024 21:23   CT Head Wo Contrast Result Date: 01/29/2024 CLINICAL DATA:  Head trauma fall EXAM: CT HEAD WITHOUT CONTRAST TECHNIQUE: Contiguous axial images were obtained from the base of the skull through the vertex without intravenous contrast. RADIATION DOSE REDUCTION: This exam was performed according to the departmental dose-optimization program which includes automated exposure control, adjustment of the mA and/or kV according to patient size and/or use of iterative reconstruction technique. COMPARISON:  CT brain 01/21/2024, 12/18/2023 FINDINGS: Brain: No acute territorial infarction, hemorrhage or intracranial mass. Mild atrophy. Mild patchy white matter hypodensity. Nonenlarged ventricles Vascular: No hyperdense vessels.  Carotid vascular  calcification Skull: Normal. Negative for fracture or focal lesion. Sinuses/Orbits: No acute finding. Other: None IMPRESSION: 1. No CT evidence for acute intracranial abnormality. 2. Atrophy and mild chronic small vessel ischemic changes of the white matter. Electronically Signed   By: Luke Bun M.D.   On: 01/29/2024 21:02   DG Elbow Complete Right Result Date: 01/29/2024 CLINICAL DATA:  Fall, right elbow pain EXAM: RIGHT ELBOW - COMPLETE 3+ VIEW COMPARISON:  None Available. FINDINGS: There is no evidence of fracture, dislocation, or joint effusion. There is no evidence of arthropathy or other focal bone abnormality. Soft tissues are unremarkable. IMPRESSION: Negative. Electronically Signed   By: Dorethia Molt M.D.   On: 01/29/2024 20:23   DG Wrist Complete Right Result Date: 01/29/2024 CLINICAL DATA:  Fall, pain EXAM: RIGHT WRIST - COMPLETE 3+ VIEW COMPARISON:  None Available. FINDINGS: There is no evidence of fracture or dislocation. There is no evidence of arthropathy or other focal bone abnormality. There is soft tissue swelling dorsal to the radiocarpal articulation. IMPRESSION: 1. Soft tissue swelling. No fracture or dislocation.  Electronically Signed   By: Dorethia Molt M.D.   On: 01/29/2024 20:22   DG Knee Complete 4 Views Right Result Date: 01/29/2024 CLINICAL DATA:  Right knee pain after fall today. EXAM: RIGHT KNEE - COMPLETE 4+ VIEW COMPARISON:  January 12, 2024. FINDINGS: No evidence of fracture, dislocation, or joint effusion. Mild narrowing of medial joint space is noted. Moderate prepatellar soft tissue swelling is noted suggesting traumatic injury. IMPRESSION: Moderate prepatellar soft tissue swelling is noted suggesting traumatic injury. No fracture or dislocation is noted. Electronically Signed   By: Lynwood Landy Raddle M.D.   On: 01/29/2024 16:43    Assessment  78 y.o. female with a history of endometrial cancer medically inoperable, s/p IMRT, Afib on Eliquis , bipolar 1 disorder,  CAD, CHF, chronic pain, DM, HTN, Herpes, NAFLD, OSA, osteopenia, RLS, pulmonary hypertension, previous history of microscopic colitis, diverticulitis on May 2025 imaging, GERD, IDA, with GI bleed in August 2025 secondary to colonic AVMs and also finding of PUD on EGD, now admitted with acute on chronic HF, AKI, GI consulted due to diarrhea, rectal bleeding on Eliquis , and abdominal pain.   Diarrhea: Cdiff and GI path negative, now back to baseline of soft stools, hx of microscopic coilits in remote past. CT lat week with descending sigmoid colon wall thickening suspect due to underdistension rather than inflammatory process. Diarrhea has resolve.d   Abdominal pain: right-sided, intermittent but not postprandial. No gallstones on CT. Unclear etiology and has constellation of GI symptoms including alternating constipation/diarrhea, right-sided abdomianl pain, unable to rule out an underlying radiation enteritis, possible occult progression of uterine cancer, not consistent with chronic mesenteric ischemia. Will update HFP. US  if increased LFTs.   Rectal bleeding: while on Eliquis  and has multiple sources for bleeding including colonic AVMs, internal hemorrhoids, hx PUD. Received 1 unit PRBCs on 9/16 after Hgb dropped to 7.2 range. Hgb 8.3 today, stable over past 48+ hours. Noted recurrent rectal bleeding yesterday evening with BM after starting Eliquis  on 9/20 and receiving 2 doses. Suspect more benign anorectal source but as she will need to be on Eliquis , recommend colonoscopy +/- EGD tomorrow. Continue to hold Eliquis  for now.       Plan / Recommendations  Clear liquids today Start prep this afternoon NPO after midnight, tap water  enemas in the morning Colonoscopy +/- EGD in am with Dr. Eartha Continue to hold Eliquis  Continue pantoprazole  and needs to be before meals for best efficacy    LOS: 7 days    02/22/2024, 8:31 AM  Therisa MICAEL Stager, PhD, ANP-BC [facility]

## 2024-02-22 NOTE — Telephone Encounter (Signed)
   Patient is currently admitted at [facility] for an acute GI bleed and acute CHF. Based off hospital notes, it looks like plan is for inpatient EGD/ colonoscopy. If pre-op evaluation is needed for inpatient procedure, please consult Cardiology on the inpatient side.   If patient needs any additional procedures following discharge, please send us  a new cardiac clearance. She would need a in-office visit for further risk-stratification at that time given current hospitalization complicated by acute on chronic CHF.   I will route this recommendation to the requesting party via Epic fax function.  Will remove from pre-op pool.  Reyah Streeter E Skai Lickteig, PA-C 02/22/2024 9:34 AM

## 2024-02-22 NOTE — Telephone Encounter (Signed)
 Cardiac clearance requested.

## 2024-02-22 NOTE — Telephone Encounter (Signed)
 Request for cardiac clearance    02/09/24   Amber Stephenson [birth year]   What type of surgery is being performed? Esophagogastroduodenoscopy (EGD)   When is surgery scheduled? TBD   What type of clearance is required (medical or pharmacy to hold medication or both? Medical   Name of physician performing surgery?  Dr.Rourk  [facility] at [employer]: [phone], option 5 Fax: [phone]   Anesthesia type (none, local, MAC, general)? MAC

## 2024-02-22 NOTE — Progress Notes (Addendum)
 Progress Note  Patient: Amber Stephenson FMW:[id number] DOB: 1945-07-04 DOA: 02/15/2024     7 DOS: the patient was seen and examined on 02/22/2024   Brief hospital admission narrative:  Amber Stephenson is a 78 y.o. female with medical history significant of endometrial cancer, atrial fibrillation on Eliquis , bipolar disorder type I, chronic pain syndrome, coronary artery disease, chronic kidney disease stage IIIa, type 2 diabetes with nephropathy, hypertension, nonalcoholic fatty liver disease, obstructive sleep apnea, restless leg syndrome and pulmonary hypertension; who presented to the emergency department secondary to melanotic diarrhea, intermittent abdominal pain and increased shortness of breath.  Patient reports symptom has been present for the last 3-4 days and worsening.  No associated nausea, no vomiting, no sick contacts, no changes in her medications.   She was seen by her PCP on the day of admission where she was instructed to presented to the ED for further evaluation, management and possible blood transfusion.   Workup demonstrated hemoglobin of 8, renal function slightly worsened from baseline with a creatinine up to 1.3 and elevated BNP of 664 with a chest x-ray demonstrating cardiomegaly and vascular congestion.   GI service was consulted, GI panel requested and patient started on IV Lasix .  TRH has been consulted to place patient in the hospital for further evaluation and management.    Assessment and plan 1-acute blood loss anemia, melanotic diarrhea, intermittent abdominal pain -- Continue PPI twice a day. 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 - Despite clearance/diet advancement patient without appetite. - CT abdomen and pelvis will be repeated in the setting of left lower quadrant pain and distention appreciated on exam. -Eliquis  had been on hold for over 5 days -- Eliquis  restarted on 02/20/2024 x 1 day only (2 doses) , pt developed rectal  bleeding in a.m.-02/21/2024 -Rectal Exam--performed by LPN Tera Isley on 02/21/24---dark blood on exam glove and bright red blood in peri-anal area -- No further Eliquis  at this time (last dose of Eliquis  02/21/24), - Discussed with Dr. Eartha, anticipate colonoscopy and possible EGD on 02/23/2024 -- No further GI bleeding on 02/22/2024, Hgb remains  > 8 after 1 unit PRBC transfused on 02/17/24  2-acute on chronic diastolic heart failure - Echo with EF of 50 to 55%, mild mitral stenosis, no aortic stenosis - Continue daily weights, strict I's and O's and low-sodium diet - -Generalized edema and tach patient improving after receiving diuresis with IV Lasix  with IV albumin  - Continue to follow renal function and electrolytes - Improving leg edema/anasarca with anterior abdominal wall edema noted - Continue diuresis =-use TED stockings  3-hypokalemia and hypomagnesemia with diuresis--replace and recheck  4-depression/anxiety - Continue Lexapro , Abilify  and as needed Xanax .  5-hyperlipidemia - Continue statin.  6-acute kidney injury - Appears to be in the setting of decreased perfusion from CHF and ongoing anemia - Creatinine improved after diuresis initiated - Continue to follow renal function trend -Upon further review patient does Not appear to have a history of confirmed CKD per se - Avoid nephrotoxic agents, hypotension and the use of contrast.  7-hyperlipidemia - Continue statin.  8-type 2 diabetes mellitus--A1c is 4.9 reflecting excellent diabetic control PTA - -sliding scale insulin  to cover CBGs above 200. - Patient following diet control as an outpatient.  9-class I obesity -Body mass index is 30.56 kg/m.  -Low-calorie diet and portion control discussed with patient.  10-restless leg syndrome/chronic pain - Continue treatment with Requip  and home analgesic regimen. - PTA patient was on OxyContin   20 mg twice daily - Currently receiving IV Dilaudid  0.5 every 6 hours as  needed for breakthrough pain  11)Chronic Hyponatremia--- sodium levels not too far from prior baselines - No vomiting or diarrhea  12)Klebsiella UTI--per patient dysuria improving - -Okay to transition from IV Rocephin  to p.o. Keflex  starting on 02/23/2024 - Last dose of Rocephin  will be 02/22/2024  Subjective:   No fever  Or chills  Appetite is not great - Improving generalized pain overall - No further rectal bleeding -per patient dysuria improving --Discussed with Dr. Eartha, anticipate colonoscopy and possible EGD on 02/23/2024  Physical Exam: Vitals:   02/21/24 1348 02/21/24 2051 02/22/24 0019 02/22/24 0615  BP: (!) 147/77 (!) 157/74  (!) 124/59  Pulse: 75 100  69  Resp:  20  15  Temp: 98.7 F (37.1 C) (!) 101.3 F (38.5 C) 98.8 F (37.1 C) 98 F (36.7 C)  TempSrc: Axillary Oral Oral Oral  SpO2: 98% 100%  96%  Weight:    75.8 kg  Height:        Physical Exam  Gen:- Awake Alert, in no acute distress  HEENT:- [place].AT, No sclera icterus Neck-Supple Neck,No JVD,.  Lungs-  CTAB , fair air movement bilaterally  CV- S1, S2 normal, RRR Abd-  +ve B.Sounds, Abd Soft, generalized abdominal discomfort on palpation, no CVA area tenderness, improving anterior abdominal wall pitting edema Extremity/Skin:-Improving +ve  edema,   good pedal pulses  Psych-affect is appropriate, oriented x3 Neuro-generalized weakness, no new focal deficits, no tremors Rectal Exam--performed by LPN Tera Isley on 02/21/24---dark blood on exam glove and bright red blood in peri-anal area  Family Communication: Husband at bedside.  Disposition: Home Status is: Inpatient  Author: Rendall Carwin, MD 02/22/2024 12:01 PM  For on call review [email or web address].

## 2024-02-23 ENCOUNTER — Encounter (HOSPITAL_COMMUNITY): Payer: Self-pay | Admitting: Internal Medicine

## 2024-02-23 ENCOUNTER — Inpatient Hospital Stay (HOSPITAL_COMMUNITY): Admitting: Anesthesiology

## 2024-02-23 ENCOUNTER — Encounter (HOSPITAL_COMMUNITY)
Admission: EM | Disposition: A | Discharge status: Home Health | Payer: Self-pay | Source: Ambulatory Visit | Attending: Internal Medicine

## 2024-02-23 DIAGNOSIS — K3189 Other diseases of stomach and duodenum: Secondary | ICD-10-CM

## 2024-02-23 DIAGNOSIS — I110 Hypertensive heart disease with heart failure: Secondary | ICD-10-CM

## 2024-02-23 DIAGNOSIS — K5520 Angiodysplasia of colon without hemorrhage: Secondary | ICD-10-CM | POA: Diagnosis not present

## 2024-02-23 DIAGNOSIS — K625 Hemorrhage of anus and rectum: Secondary | ICD-10-CM

## 2024-02-23 DIAGNOSIS — K648 Other hemorrhoids: Secondary | ICD-10-CM

## 2024-02-23 DIAGNOSIS — I5033 Acute on chronic diastolic (congestive) heart failure: Secondary | ICD-10-CM | POA: Diagnosis not present

## 2024-02-23 DIAGNOSIS — K644 Residual hemorrhoidal skin tags: Secondary | ICD-10-CM | POA: Diagnosis not present

## 2024-02-23 DIAGNOSIS — R197 Diarrhea, unspecified: Secondary | ICD-10-CM | POA: Diagnosis not present

## 2024-02-23 HISTORY — PX: COLONOSCOPY: SHX5424

## 2024-02-23 HISTORY — PX: ESOPHAGOGASTRODUODENOSCOPY: SHX5428

## 2024-02-23 LAB — GLUCOSE, CAPILLARY
Glucose-Capillary: 123 mg/dL — ABNORMAL HIGH (ref 70–99)
Glucose-Capillary: 106 mg/dL — ABNORMAL HIGH (ref 70–99)
Glucose-Capillary: 113 mg/dL — ABNORMAL HIGH (ref 70–99)
Glucose-Capillary: 231 mg/dL — ABNORMAL HIGH (ref 70–99)
Glucose-Capillary: 242 mg/dL — ABNORMAL HIGH (ref 70–99)

## 2024-02-23 SURGERY — COLONOSCOPY
Anesthesia: General

## 2024-02-23 MED ORDER — LACTATED RINGERS IV SOLN: INTRAVENOUS | Status: DC

## 2024-02-23 MED ORDER — PROPOFOL 10 MG/ML IV BOLUS
INTRAVENOUS | Status: DC | PRN
  Administered 2024-02-23: 80 mg via INTRAVENOUS

## 2024-02-23 MED ORDER — PHENYLEPHRINE 80 MCG/ML (10ML) SYRINGE FOR IV PUSH (FOR BLOOD PRESSURE SUPPORT)
PREFILLED_SYRINGE | INTRAVENOUS | Status: DC | PRN
Start: 2024-02-23 — End: 2024-02-23
  Administered 2024-02-23 (×2): 80 ug via INTRAVENOUS

## 2024-02-23 MED ORDER — SODIUM CHLORIDE 0.9 % IV SOLN: INTRAVENOUS | Status: DC

## 2024-02-23 MED ORDER — STERILE WATER FOR IRRIGATION IR SOLN
Status: DC | PRN
  Administered 2024-02-23 (×2): 50 mL

## 2024-02-23 MED ORDER — PROPOFOL 500 MG/50ML IV EMUL
INTRAVENOUS | Status: DC | PRN
  Administered 2024-02-23: 150 ug/kg/min via INTRAVENOUS

## 2024-02-23 MED ORDER — LIDOCAINE 2% (20 MG/ML) 5 ML SYRINGE
INTRAMUSCULAR | Status: DC | PRN
  Administered 2024-02-23: 50 mg via INTRAVENOUS

## 2024-02-23 MED ORDER — HYDROCORTISONE (PERIANAL) 2.5 % EX CREA
TOPICAL_CREAM | Freq: Two times a day (BID) | CUTANEOUS | Status: DC
  Filled 2024-02-23: qty 28.35

## 2024-02-23 MED ORDER — ALBUMIN HUMAN 25 % IV SOLN
25.0000 g | Freq: Four times a day (QID) | INTRAVENOUS | Status: AC
  Administered 2024-02-23 (×2): 25 g via INTRAVENOUS
  Filled 2024-02-23 (×2): qty 100

## 2024-02-23 NOTE — Progress Notes (Signed)
 Patient completed the full bowel preparation with multiple bowel movements overnight. Stool progressed to a yellow, clear appearance, consistent with adequate prep. Patient has remained NPO since midnight in preparation for colonoscopy this morning.

## 2024-02-23 NOTE — Progress Notes (Addendum)
 We will proceed with EGD and colonoscopy as scheduled.  I thoroughly discussed with the patient the procedure, including the risks involved. Patient understands what the procedure involves including the benefits and any risks. Patient understands alternatives to the proposed procedure. Risks including (but not limited to) bleeding, tearing of the lining (perforation), rupture of adjacent organs, problems with heart and lung function, infection, and medication reactions. A small percentage of complications may require surgery, hospitalization, repeat endoscopic procedure, and/or transfusion.  Patient understood and agreed.  Katrinka Blazing, MD Gastroenterology and Hepatology [facility]astroenterology

## 2024-02-23 NOTE — Op Note (Addendum)
 [facility] Patient Name: Amber Stephenson Procedure Date: 02/23/2024 11:19 AM MRN: [id number] Date of Birth: 17-Jun-1945 Attending MD: Toribio Fortune , , [id number] CSN: [id number] Age: 78 Admit Type: Inpatient Procedure:                Upper GI endoscopy Indications:              Abdominal pain, Diarrhea Providers:                Toribio Fortune, Jon LABOR. Gerome RN, RN, Jon Loge Referring MD:              Medicines:                Monitored Anesthesia Care Complications:            No immediate complications. Estimated Blood Loss:     Estimated blood loss: none. Procedure:                Pre-Anesthesia Assessment:                           - Prior to the procedure, a History and Physical                            was performed, and patient medications, allergies                            and sensitivities were reviewed. The patient's                            tolerance of previous anesthesia was reviewed.                           - The risks and benefits of the procedure and the                            sedation options and risks were discussed with the                            patient. All questions were answered and informed                            consent was obtained.                           - ASA Grade Assessment: III - A patient with severe                            systemic disease.                           After obtaining informed consent, the endoscope was                            passed under direct vision. Throughout the  procedure, the patient's blood pressure, pulse, and                            oxygen  saturations were monitored continuously. The                            [id number] ([id number]) Upper was introduced through                            the mouth, and advanced to the second part of                            duodenum. The upper GI endoscopy was accomplished                             without difficulty. The patient tolerated the                            procedure well. Scope In: 11:43:27 AM Scope Out: 11:48:13 AM Total Procedure Duration: 0 hours 4 minutes 46 seconds  Findings:      The examined esophagus was normal.      Two localized 4 mm erosions with no stigmata of recent bleeding were       found in the gastric body. These were likey healed ulcers. Biopsies were       taken with a cold forceps for Helicobacter pylori testing.      The examined duodenum was normal. Biopsies were taken with a cold       forceps for histology. Impression:               - Normal esophagus.                           - Erosive gastropathy with no stigmata of recent                            bleeding. Biopsied.                           - Normal examined duodenum. Biopsied. Moderate Sedation:      Per Anesthesia Care Recommendation:           - Use Protonix  (pantoprazole ) 40 mg PO BID.                           - Proceed with colonoscopy.                           - Await pathology results. Procedure Code(s):        --- Professional ---                           [phone], Esophagogastroduodenoscopy, flexible,                            transoral; with biopsy, single or multiple Diagnosis Code(s):        --- Professional ---  K31.89, Other diseases of stomach and duodenum                           R10.9, Unspecified abdominal pain                           R19.7, Diarrhea, unspecified CPT copyright 2022 American Medical Association. All rights reserved. The codes documented in this report are preliminary and upon coder review may  be revised to meet current compliance requirements. Toribio Fortune, MD Toribio Fortune,  02/23/2024 11:52:23 AM This report has been signed electronically. Number of Addenda: 0

## 2024-02-23 NOTE — Progress Notes (Signed)
  Addendum:- EGD FINDINGS: - Normal esophagus.  - Erosive gastropathy with no stigmata of recent bleeding. Biopsied.  - Normal examined duodenum. Biopsied.   COLONOSCOPY FINDINGS: - Hemorrhoids found on perianal exam.  - The rectum, descending colon, transverse colon, ascending colon and cecum are normal. Biopsied.  - Multiple non-bleeding colonic angiodysplastic lesions. Treated with argon plasma coagulation (APC).  - Non-bleeding external and internal hemorrhoids.   RECOMMENDATIONS - Return patient to hospital ward for ongoing care.  - Resume previous diet.  - Await pathology results.  - Repeat colonoscopy in 2 months for retreatment.  - Restart anticoagulation tomorrow. - Anusol  cream twice a day in hemorrhoidal area for a week. - Use Protonix  (pantoprazole ) 40 mg PO BID.

## 2024-02-23 NOTE — Anesthesia Preprocedure Evaluation (Signed)
 Anesthesia Evaluation  Patient identified by MRN, date of birth, ID band Patient awake    Reviewed: Allergy & Precautions, H&P , NPO status , Patient's Chart, lab work & pertinent test results, reviewed documented beta blocker date and time   Airway Mallampati: II  TM Distance: >3 FB Neck ROM: full    Dental no notable dental hx.    Pulmonary asthma , sleep apnea    Pulmonary exam normal breath sounds clear to auscultation       Cardiovascular Exercise Tolerance: Good hypertension, + CAD and +CHF   Rhythm:regular Rate:Normal     Neuro/Psych  Headaches PSYCHIATRIC DISORDERS Anxiety Depression Bipolar Disorder    Neuromuscular disease    GI/Hepatic Neg liver ROS, PUD,,,  Endo/Other  diabetes    Renal/GU negative Renal ROS  negative genitourinary   Musculoskeletal   Abdominal   Peds  Hematology  (+) Blood dyscrasia, anemia   Anesthesia Other Findings   Reproductive/Obstetrics negative OB ROS                              Anesthesia Physical Anesthesia Plan  ASA: 3 and emergent  Anesthesia Plan: General   Post-op Pain Management:    Induction:   PONV Risk Score and Plan: Propofol  infusion  Airway Management Planned:   Additional Equipment:   Intra-op Plan:   Post-operative Plan:   Informed Consent: I have reviewed the patients History and Physical, chart, labs and discussed the procedure including the risks, benefits and alternatives for the proposed anesthesia with the patient or authorized representative who has indicated his/her understanding and acceptance.     Dental Advisory Given  Plan Discussed with: CRNA  Anesthesia Plan Comments:         Anesthesia Quick Evaluation

## 2024-02-23 NOTE — Op Note (Signed)
 [facility] Patient Name: Amber Stephenson Procedure Date: 02/23/2024 11:18 AM MRN: [id number] Date of Birth: [birth year] Attending MD: Toribio Fortune , , [id number] CSN: [id number] Age: 78 Admit Type: Inpatient Procedure:                Colonoscopy Indications:              Abdominal pain, Rectal bleeding, Diarrhea Providers:                Toribio Fortune, Jon LABOR. Gerome RN, RN, Jon Loge Referring MD:              Medicines:                Monitored Anesthesia Care Complications:            No immediate complications. Estimated Blood Loss:     Estimated blood loss: none. Procedure:                Pre-Anesthesia Assessment:                           - Prior to the procedure, a History and Physical                            was performed, and patient medications, allergies                            and sensitivities were reviewed. The patient's                            tolerance of previous anesthesia was reviewed.                           - The risks and benefits of the procedure and the                            sedation options and risks were discussed with the                            patient. All questions were answered and informed                            consent was obtained.                           - ASA Grade Assessment: III - A patient with severe                            systemic disease.                           After obtaining informed consent, the colonoscope                            was passed under direct vision. Throughout the  procedure, the patient's blood pressure, pulse, and                            oxygen  saturations were monitored continuously. The                            [id number]-[id number] ([id number]) Peds Colon was introduced                            through the anus and advanced to the the cecum,                            identified by appendiceal orifice and ileocecal                             valve. The colonoscopy was performed without                            difficulty. The patient tolerated the procedure                            well. The quality of the bowel preparation was fair. Scope In: 11:54:08 AM Scope Out: 12:34:21 PM Scope Withdrawal Time: 0 hours 27 minutes 59 seconds  Total Procedure Duration: 0 hours 40 minutes 13 seconds  Findings:      Hemorrhoids were found on perianal exam. This external hemorrhoid was       large.      The rectum, descending colon, transverse colon, ascending colon and       cecum appeared normal. Biopsies for histology were taken with a cold       forceps from the right colon and left colon for evaluation of       microscopic colitis.      Multiple small localized angiodysplastic lesions without bleeding were       found in the sigmoid colon. Coagulation for bleeding prevention using       argon plasma at 0.3 liters/minute and 20 watts was successful.      Non-bleeding external and internal hemorrhoids were found during       retroflexion and during perianal exam. Impression:               - Preparation of the colon was fair.                           - Hemorrhoids found on perianal exam.                           - The rectum, descending colon, transverse colon,                            ascending colon and cecum are normal. Biopsied.                           - Multiple non-bleeding colonic angiodysplastic  lesions. Treated with argon plasma coagulation                            (APC).                           - Non-bleeding external and internal hemorrhoids. Moderate Sedation:      Per Anesthesia Care Recommendation:           - Return patient to hospital ward for ongoing care.                           - Resume previous diet.                           - Await pathology results.                           - Repeat colonoscopy in 2 months for retreatment.                            - Restart anticoagulation tomorrow.                           - Anusol  cream twice a day in hemorrhoidal area for                            a week. Procedure Code(s):        --- Professional ---                           [phone], 59, Colonoscopy, flexible; with control of                            bleeding, any method                           45380, Colonoscopy, flexible; with biopsy, single                            or multiple Diagnosis Code(s):        --- Professional ---                           K64.8, Other hemorrhoids                           K55.20, Angiodysplasia of colon without hemorrhage                           R10.9, Unspecified abdominal pain                           K62.5, Hemorrhage of anus and rectum                           R19.7, Diarrhea, unspecified CPT copyright 2022 American Medical Association. All rights reserved. The codes documented in this  report are preliminary and upon coder review may  be revised to meet current compliance requirements. Toribio Fortune, MD Toribio Fortune,  02/23/2024 12:42:02 PM This report has been signed electronically. Number of Addenda: 0

## 2024-02-23 NOTE — Plan of Care (Signed)
  Problem: Coping: Goal: Ability to adjust to condition or change in health will improve Outcome: Progressing   Problem: Fluid Volume: Goal: Ability to maintain a balanced intake and output will improve Outcome: Progressing   Problem: Health Behavior/Discharge Planning: Goal: Ability to identify and utilize available resources and services will improve Outcome: Progressing Goal: Ability to manage health-related needs will improve Outcome: Progressing   Problem: Metabolic: Goal: Ability to maintain appropriate glucose levels will improve Outcome: Progressing   Problem: Nutritional: Goal: Maintenance of adequate nutrition will improve Outcome: Progressing Goal: Progress toward achieving an optimal weight will improve Outcome: Progressing   Problem: Skin Integrity: Goal: Risk for impaired skin integrity will decrease Outcome: Progressing   Problem: Tissue Perfusion: Goal: Adequacy of tissue perfusion will improve Outcome: Progressing   Problem: Education: Goal: Knowledge of General Education information will improve Description: Including pain rating scale, medication(s)/side effects and non-pharmacologic comfort measures Outcome: Progressing   Problem: Health Behavior/Discharge Planning: Goal: Ability to manage health-related needs will improve Outcome: Progressing   Problem: Clinical Measurements: Goal: Ability to maintain clinical measurements within normal limits will improve Outcome: Progressing Goal: Will remain free from infection Outcome: Progressing Goal: Diagnostic test results will improve Outcome: Progressing Goal: Respiratory complications will improve Outcome: Progressing Goal: Cardiovascular complication will be avoided Outcome: Progressing   Problem: Activity: Goal: Risk for activity intolerance will decrease Outcome: Progressing   Problem: Nutrition: Goal: Adequate nutrition will be maintained Outcome: Progressing   Problem: Coping: Goal:  Level of anxiety will decrease Outcome: Progressing   Problem: Elimination: Goal: Will not experience complications related to bowel motility Outcome: Progressing Goal: Will not experience complications related to urinary retention Outcome: Progressing   Problem: Pain Managment: Goal: General experience of comfort will improve and/or be controlled Outcome: Progressing   Problem: Skin Integrity: Goal: Risk for impaired skin integrity will decrease Outcome: Progressing

## 2024-02-23 NOTE — Progress Notes (Addendum)
 Progress Note  Patient: Amber Stephenson FMW:[id number] DOB: 07/13/1945 DOA: 02/15/2024     8 DOS: the patient was seen and examined on 02/23/2024   Brief hospital admission narrative:  Amber Stephenson is a 78 y.o. female with medical history significant of endometrial cancer, atrial fibrillation on Eliquis , bipolar disorder type I, chronic pain syndrome, coronary artery disease, chronic kidney disease stage IIIa, type 2 diabetes with nephropathy, hypertension, nonalcoholic fatty liver disease, obstructive sleep apnea, restless leg syndrome and pulmonary hypertension admitted on 01/15/2024 with acute on chronic anemia due to presumed lower GI/rectal bleeding/acute blood loss anemia -Eliquis  on hold, awaiting EGD and  colonoscopy on 02/23/2024  Assessment and plan 1-acute blood loss anemia, melanotic diarrhea, intermittent abdominal pain -- Continue PPI twice a day. 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 - Despite clearance/diet advancement patient without appetite. - CT abdomen and pelvis will be repeated in the setting of left lower quadrant pain and distention appreciated on exam. -Eliquis  had been on hold for over 5 days -- Eliquis  restarted on 02/20/2024 x 1 day only (2 doses) , pt developed rectal bleeding in a.m.-02/21/2024 -Rectal Exam--performed by LPN Tera Isley on 02/21/24---dark blood on exam glove and bright red blood in peri-anal area -- No further Eliquis  at this time (last dose of Eliquis  02/21/24), -Completed colonoscopy prep overnight--stools watery/light brown --Discussed with Dr. Eartha, awaiting EGD and colonoscopy on 02/23/2024 -- No further GI/Rectal bleeding on 02/22/2024, Hgb remains  > 8 after 1 unit PRBC transfused on 02/17/24 - If EGD and colonoscopy on 02/23/2024 is without significant colonic findings may restart Eliquis  and monitor hemoglobin for 24 to 48 hours prior to discharge  2-acute on chronic diastolic heart failure - Echo with EF  of 50 to 55%, mild mitral stenosis, no aortic stenosis - Continue daily weights, strict I's and O's and low-sodium diet - -Generalized edema and tach patient improving after receiving diuresis with IV Lasix  with IV albumin  - Continue to follow renal function and electrolytes - Improving leg edema/anasarca with anterior abdominal wall edema noted - Continue IV Lasix  =-use TED stockings  3-hypokalemia and hypomagnesemia with diuresis--replace and recheck  4-depression/anxiety - Continue Lexapro , Abilify  and as needed Xanax .  5-hyperlipidemia - Continue statin.  6-acute kidney injury - Appears to be in the setting of decreased perfusion from CHF and ongoing anemia - Creatinine improved after diuresis initiated - Continue to follow renal function trend -Upon further review patient does Not appear to have a history of confirmed CKD per se - Avoid nephrotoxic agents, hypotension and the use of contrast.  7)Klebsiella UTI--per patient dysuria improving - -Okay to transition from IV Rocephin  to p.o. Keflex  starting on 02/23/2024 - Last dose of Rocephin  will be 02/22/2024  8-type 2 diabetes mellitus--A1c is 4.9 reflecting excellent diabetic control PTA - -sliding scale insulin  to cover CBGs above 200. - Patient following diet control as an outpatient.  9-class I obesity -Body mass index is 31.01 kg/m.  -Low-calorie diet and portion control discussed with patient.  10-restless leg syndrome/chronic pain - Continue treatment with Requip  and home analgesic regimen. - PTA patient was on OxyContin  20 mg twice daily - Currently receiving IV Dilaudid  0.5 every 6 hours as needed for breakthrough pain  11)Chronic Hyponatremia--- sodium levels not too far from prior baselines - No vomiting or diarrhea   Subjective:   No fever  Or chills  Appetite is not great - Improving generalized pain overall - No further rectal bleeding -Completed colonoscopy prep  overnight--stools watery/light  brown --Discussed with Dr. Eartha, awaiting EGD and colonoscopy on 02/23/2024  Physical Exam: Vitals:   02/22/24 1413 02/22/24 2108 02/23/24 0407 02/23/24 0500  BP: 110/75 (!) 142/86 (!) 129/58   Pulse: 79 85 85   Resp:  20 20   Temp: 98.6 F (37 C) (!) 97.5 F (36.4 C) 98.1 F (36.7 C)   TempSrc: Oral Oral Oral   SpO2: 95% 100% 96%   Weight:    76.9 kg  Height:        Physical Exam  Gen:- Awake Alert, in no acute distress  HEENT:- [place].AT, No sclera icterus Neck-Supple Neck,No JVD,.  Lungs-  CTAB , fair air movement bilaterally  CV- S1, S2 normal, RRR Abd-  +ve B.Sounds, Abd Soft, generalized abdominal discomfort on palpation, no CVA area tenderness, improving anterior abdominal wall pitting edema Extremity/Skin:-Improving +ve  edema,   good pedal pulses  Psych-affect is appropriate, oriented x3 Neuro-generalized weakness, no new focal deficits, no tremors  Family Communication: Husband at bedside.  Disposition: Home Status is: Inpatient  Author: Rendall Carwin, MD 02/23/2024 10:10 AM  For on call review [email or web address].

## 2024-02-23 NOTE — Transfer of Care (Signed)
 Immediate Anesthesia Transfer of Care Note  Patient: Amber Stephenson  Procedure(s) Performed: COLONOSCOPY EGD (ESOPHAGOGASTRODUODENOSCOPY)  Patient Location: PACU  Anesthesia Type:General  Level of Consciousness: awake  Airway & Oxygen  Therapy: Patient Spontanous Breathing  Post-op Assessment: Report given to RN  Post vital signs: Reviewed and stable  Last Vitals:  Vitals Value Taken Time  BP 97/53 02/23/24 12:40  Temp    Pulse    Resp 22 02/23/24 12:41  SpO2    Vitals shown include unfiled device data.  Last Pain:  Vitals:   02/23/24 1135  TempSrc:   PainSc: 8       Patients Stated Pain Goal: 7 (02/23/24 1039)  Complications: No notable events documented.

## 2024-02-23 NOTE — Brief Op Note (Signed)
 02/15/2024 - 02/23/2024  11:52 AM  PATIENT:  Amber Stephenson  78 y.o. female  PRE-OPERATIVE DIAGNOSIS:  rectal bleeding, IDA  POST-OPERATIVE DIAGNOSIS:  * No post-op diagnosis entered *  PROCEDURE:  Procedure(s): COLONOSCOPY (N/A) EGD (ESOPHAGOGASTRODUODENOSCOPY) (N/A)  SURGEON:  Surgeons and Role:    * Eartha Angelia Sieving, MD - Primary  Patient underwent EGD AND COLONOSCOPY under propofol  sedation.  Tolerated the procedure adequately.   EGD FINDINGS: - Normal esophagus.  - Erosive gastropathy with no stigmata of recent bleeding.  Biopsied.  - Normal examined duodenum.  Biopsied.   COLONOSCOPY FINDINGS: - Hemorrhoids found on perianal exam.  - The rectum, descending colon, transverse colon, ascending colon and cecum are normal.  Biopsied.  - Multiple non-bleeding colonic angiodysplastic lesions.  Treated with argon plasma coagulation (APC).  - Non-bleeding external and internal hemorrhoids.   RECOMMENDATIONS - Return patient to hospital ward for ongoing care.  - Resume previous diet.  - Await pathology results.  - Repeat colonoscopy in 2 months for retreatment.  - Restart anticoagulation tomorrow. - Anusol  cream twice a day in hemorrhoidal area for a week. - Use Protonix  (pantoprazole ) 40 mg PO BID.    Sieving Eartha, MD Gastroenterology and Hepatology [facility]

## 2024-02-23 NOTE — Progress Notes (Signed)
 PT Cancellation Note  Patient Details Name: Amber Stephenson MRN: [id number] DOB: 04-30-1946   Cancelled Treatment:    Reason Eval/Treat Not Completed: Patient declined, no reason specified. Pt declined PT today due to fatigue from recent colonoscopy.    Lacinda Fass, PT, DPT  02/23/2024, 2:47 PM

## 2024-02-23 NOTE — Plan of Care (Signed)

## 2024-02-23 NOTE — Anesthesia Postprocedure Evaluation (Signed)
 Anesthesia Post Note  Patient: Amber Stephenson  Procedure(s) Performed: COLONOSCOPY EGD (ESOPHAGOGASTRODUODENOSCOPY)  Patient location during evaluation: Short Stay Anesthesia Type: General Level of consciousness: awake and alert Pain management: pain level controlled Vital Signs Assessment: post-procedure vital signs reviewed and stable Respiratory status: spontaneous breathing Cardiovascular status: blood pressure returned to baseline and stable Postop Assessment: no apparent nausea or vomiting Anesthetic complications: no   No notable events documented.   Last Vitals:  Vitals:   02/23/24 1039 02/23/24 1241  BP: 133/65 (!) 97/53  Pulse:    Resp: 20   Temp: 36.7 C 36.9 C  SpO2: 100% 100%    Last Pain:  Vitals:   02/23/24 1303  TempSrc:   PainSc: 4                  Declin Rajan

## 2024-02-24 ENCOUNTER — Encounter (HOSPITAL_COMMUNITY): Payer: Self-pay | Admitting: Gastroenterology

## 2024-02-24 ENCOUNTER — Ambulatory Visit (INDEPENDENT_AMBULATORY_CARE_PROVIDER_SITE_OTHER): Payer: Self-pay | Admitting: Gastroenterology

## 2024-02-24 DIAGNOSIS — I5033 Acute on chronic diastolic (congestive) heart failure: Secondary | ICD-10-CM | POA: Diagnosis not present

## 2024-02-24 DIAGNOSIS — R197 Diarrhea, unspecified: Secondary | ICD-10-CM | POA: Diagnosis not present

## 2024-02-24 DIAGNOSIS — R109 Unspecified abdominal pain: Secondary | ICD-10-CM | POA: Diagnosis not present

## 2024-02-24 DIAGNOSIS — K625 Hemorrhage of anus and rectum: Secondary | ICD-10-CM | POA: Diagnosis not present

## 2024-02-24 LAB — BASIC METABOLIC PANEL WITH GFR
Anion gap: 11 (ref 5–15)
BUN: 10 mg/dL (ref 8–23)
CO2: 27 mmol/L (ref 22–32)
Calcium: 8 mg/dL — ABNORMAL LOW (ref 8.9–10.3)
Chloride: 97 mmol/L — ABNORMAL LOW (ref 98–111)
Creatinine, Ser: 0.55 mg/dL (ref 0.44–1.00)
GFR, Estimated: >60 mL/min (ref >60)
Glucose, Bld: 216 mg/dL — ABNORMAL HIGH (ref 70–99)
Potassium: 3.6 mmol/L (ref 3.5–5.1)
Sodium: 135 mmol/L (ref 135–145)

## 2024-02-24 LAB — IRON AND TIBC
Iron: 34 ug/dL (ref 28–170)
Saturation Ratios: 18 % (ref 10.4–31.8)
TIBC: 190 ug/dL — ABNORMAL LOW (ref 250–450)
UIBC: 156 ug/dL

## 2024-02-24 LAB — HEMOGLOBIN AND HEMATOCRIT, BLOOD
HCT: 25.9 % — ABNORMAL LOW (ref 36.0–46.0)
Hemoglobin: 7.9 g/dL — ABNORMAL LOW (ref 12.0–15.0)

## 2024-02-24 LAB — FERRITIN: Ferritin: 58 ng/mL (ref 11–307)

## 2024-02-24 LAB — SURGICAL PATHOLOGY

## 2024-02-24 LAB — GLUCOSE, CAPILLARY
Glucose-Capillary: 216 mg/dL — ABNORMAL HIGH (ref 70–99)
Glucose-Capillary: 243 mg/dL — ABNORMAL HIGH (ref 70–99)
Glucose-Capillary: 229 mg/dL — ABNORMAL HIGH (ref 70–99)

## 2024-02-24 MED ORDER — LIDOCAINE 5 % EX PTCH
1.0000 | MEDICATED_PATCH | CUTANEOUS | Status: DC
  Administered 2024-02-24 – 2024-02-25 (×2): 1 via TRANSDERMAL
  Filled 2024-02-24 (×2): qty 1

## 2024-02-24 MED ORDER — CEFADROXIL 500 MG PO CAPS
500.0000 mg | ORAL_CAPSULE | Freq: Two times a day (BID) | ORAL | Status: DC
Start: 2024-02-24 — End: 2024-02-25
  Administered 2024-02-24 – 2024-02-25 (×2): 500 mg via ORAL
  Filled 2024-02-24 (×2): qty 1

## 2024-02-24 NOTE — Progress Notes (Signed)
 Occupational Therapy Treatment Patient Details Name: Amber Stephenson MRN: [id number] DOB: Aug 26, 1945 Today's Date: 02/24/2024   History of present illness Amber Stephenson is a 78 y.o. female with medical history significant of endometrial cancer, atrial fibrillation on Eliquis , bipolar disorder type I, chronic pain syndrome, coronary artery disease, chronic kidney disease stage IIIa, type 2 diabetes with nephropathy, hypertension, nonalcoholic fatty liver disease, obstructive sleep apnea, restless leg syndrome and pulmonary hypertension; who presented to the emergency department secondary to melanotic diarrhea, intermittent abdominal pain and increased shortness of breath.  Patient reports symptom has been present for the last 3-4 days and worsening.  No associated nausea, no vomiting, no sick contacts, no changes in her medications.   OT comments  OT following up with patient progress. She continues to feel near her baseline and at the time of the session she was having minimal pain. Pt is able to ambulate to and from the bathroom with no AD and as of today she reported no assist needed for her bed mobility. Pt has no further OT needs and all concerns/questions answered. Acute OT will sign off at this time.       If plan is discharge home, recommend the following:  A little help with walking and/or transfers;A lot of help with bathing/dressing/bathroom;Assistance with cooking/housework   Equipment Recommendations  None recommended by OT    Recommendations for Other Services      Precautions / Restrictions Precautions Precautions: Fall       Mobility Bed Mobility Overal bed mobility: Needs Assistance Bed Mobility: Supine to Sit, Sit to Supine     Supine to sit: Contact guard, Used rails Sit to supine: Supervision   General bed mobility comments: With increased time and effort pt is able to complete    Transfers Overall transfer level: Needs assistance Equipment used:  None Transfers: Sit to/from Stand Sit to Stand: Modified independent (Device/Increase time)           General transfer comment: Increased time and effort     Balance Overall balance assessment: Needs assistance Sitting-balance support: No upper extremity supported, Feet supported Sitting balance-Leahy Scale: Good     Standing balance support: During functional activity, No upper extremity supported Standing balance-Leahy Scale: Fair Standing balance comment: without AD                           ADL either performed or assessed with clinical judgement   ADL                       Lower Body Dressing: Minimal assistance;Sitting/lateral leans   Toilet Transfer: Contact guard assist;Ambulation             General ADL Comments: Pt able to ambulate to and from bathroom, using no DME. Difficulty with donning socks.    Extremity/Trunk Assessment              Vision       Restaurant manager, fast food Communication: No apparent difficulties   Cognition Arousal: Alert Behavior During Therapy: WFL for tasks assessed/performed Cognition: No apparent impairments                               Following commands: Intact        Cueing   Cueing Techniques: Verbal cues, Visual cues  Exercises  Shoulder Instructions       General Comments VSS on 2L O2    Pertinent Vitals/ Pain       Pain Assessment Pain Assessment: Faces Faces Pain Scale: Hurts a little bit Pain Location: underneath R arm and right lower leg Pain Descriptors / Indicators: Aching Pain Intervention(s): Repositioned, Premedicated before session  Home Living                                          Prior Functioning/Environment              Frequency           Progress Toward Goals  OT Goals(current goals can now be found in the care plan section)  Progress towards OT goals: Goals met/education  completed, patient discharged from OT  Acute Rehab OT Goals Patient Stated Goal: To go home OT Goal Formulation: With patient Time For Goal Achievement: 03/04/24 Potential to Achieve Goals: Good ADL Goals Pt Will Perform Grooming: with modified independence;standing Pt Will Perform Lower Body Bathing: with modified independence;sit to/from stand;sitting/lateral leans Pt Will Perform Lower Body Dressing: with modified independence;sit to/from stand;sitting/lateral leans Pt Will Transfer to Toilet: with modified independence;ambulating Pt Will Perform Toileting - Clothing Manipulation and hygiene: with modified independence;sitting/lateral leans;sit to/from stand  Plan      Co-evaluation                 AM-PAC OT 6 Clicks Daily Activity     Outcome Measure   Help from another person eating meals?: None Help from another person taking care of personal grooming?: None Help from another person toileting, which includes using toliet, bedpan, or urinal?: A Little Help from another person bathing (including washing, rinsing, drying)?: A Little Help from another person to put on and taking off regular upper body clothing?: None Help from another person to put on and taking off regular lower body clothing?: A Little 6 Click Score: 21    End of Session Equipment Utilized During Treatment: Oxygen   OT Visit Diagnosis: Unsteadiness on feet (R26.81);Other abnormalities of gait and mobility (R26.89);Muscle weakness (generalized) (M62.81)   Activity Tolerance No increased pain   Patient Left in bed;with call bell/phone within reach;with family/visitor present   Nurse Communication Mobility status        Time: 8445-8391 OT Time Calculation (min): 14 min  Charges: OT General Charges $OT Visit: 1 Visit OT Treatments $Self Care/Home Management : 8-22 mins  [name], OTR/L [facility] Acute Rehab  [name] [name] 02/24/2024, 4:33 PM

## 2024-02-24 NOTE — Progress Notes (Signed)
 Physical Therapy Treatment Patient Details Name: Amber Stephenson MRN: [id number] DOB: [birth year] Today's Date: 02/24/2024   History of Present Illness Amber Stephenson is a 78 y.o. female with medical history significant of endometrial cancer, atrial fibrillation on Eliquis , bipolar disorder type I, chronic pain syndrome, coronary artery disease, chronic kidney disease stage IIIa, type 2 diabetes with nephropathy, hypertension, nonalcoholic fatty liver disease, obstructive sleep apnea, restless leg syndrome and pulmonary hypertension; who presented to the emergency department secondary to melanotic diarrhea, intermittent abdominal pain and increased shortness of breath.  Patient reports symptom has been present for the last 3-4 days and worsening.  No associated nausea, no vomiting, no sick contacts, no changes in her medications.    PT Comments  Pt was agreeable to completing today's PT treatment. Today's treatment focused on transfers from the bed to the chair followed by seated interventions. She was limited by her familiar pain during today's session. However, she noted reduced pain when standing and sitting in the chair compared to supine. She required minA to transition from sidelying to sitting. Once sitting, she was able to transfer to standing and complete a step pivot transfer to the chair. She was able to ambulate short distances with and without a RW. She reported feeling alright upon the conclusion of today's treatment. She was left in the chair with the call bell within reach. Patient will benefit from continued skilled physical therapy in hospital and recommended venue below to increase strength, balance, endurance for safe ADLs and gait.     If plan is discharge home, recommend the following: A little help with walking and/or transfers;A little help with bathing/dressing/bathroom;Help with stairs or ramp for entrance;Assist for transportation   Can travel by private vehicle         Equipment Recommendations  None recommended by PT    Recommendations for Other Services       Precautions / Restrictions Precautions Precautions: Fall Recall of Precautions/Restrictions: Intact Restrictions Weight Bearing Restrictions Per Provider Order: No     Mobility  Bed Mobility Overal bed mobility: Needs Assistance Bed Mobility: Supine to Sit     Supine to sit: Min assist, Used rails, HOB elevated     General bed mobility comments: minA with sidelying to upright sitting; modified independent w/ rolling to sidelying    Transfers Overall transfer level: Needs assistance Equipment used: Rolling walker (2 wheels) Transfers: Sit to/from Stand, Bed to chair/wheelchair/BSC Sit to Stand: Modified independent (Device/Increase time)   Step pivot transfers: Supervision            Ambulation/Gait Ambulation/Gait assistance: Supervision Gait Distance (Feet): 10 Feet Assistive device: Rolling walker (2 wheels), None (5 ft w/ RW and 5 ft w/o RW) Gait Pattern/deviations: Step-through pattern, Decreased step length - right, Decreased step length - left, Drifts right/left, Decreased stride length Gait velocity: decreased     General Gait Details: slow and labored movement secondary to pain   Stairs             Wheelchair Mobility     Tilt Bed    Modified Rankin (Stroke Patients Only)       Balance Overall balance assessment: Needs assistance Sitting-balance support: No upper extremity supported, Feet supported Sitting balance-Leahy Scale: Good Sitting balance - Comments: Seated EOB   Standing balance support: During functional activity, No upper extremity supported Standing balance-Leahy Scale: Fair Standing balance comment: without AD  Financial trader Exercises - Lower Extremity Ankle Circles/Pumps: Both, 20  reps, Seated Quad Sets: Both, 15 reps, Seated (3 second hold) Gluteal Sets: Both, 15 reps, Seated (3 second hold) Long Arc Quad: Both, 15 reps    General Comments        Pertinent Vitals/Pain Pain Assessment Pain Assessment: 0-10 Pain Score: 8  Pain Location: underneath R arm and right lower leg Pain Descriptors / Indicators: Sharp, Aching (R arm: sharp  Right leg: ache) Pain Intervention(s): Limited activity within patient's tolerance, Monitored during session, Repositioned    Home Living                          Prior Function            PT Goals (current goals can now be found in the care plan section) Acute Rehab PT Goals Patient Stated Goal: Return home with HHPT services PT Goal Formulation: With patient Time For Goal Achievement: 02/26/24 Potential to Achieve Goals: Good Progress towards PT goals: Progressing toward goals    Frequency    Min 3X/week      PT Plan      Co-evaluation              AM-PAC PT 6 Clicks Mobility   Outcome Measure  Help needed turning from your back to your side while in a flat bed without using bedrails?: A Little Help needed moving from lying on your back to sitting on the side of a flat bed without using bedrails?: A Lot Help needed moving to and from a bed to a chair (including a wheelchair)?: A Little Help needed standing up from a chair using your arms (e.g., wheelchair or bedside chair)?: A Little Help needed to walk in hospital room?: A Little Help needed climbing 3-5 steps with a railing? : A Little 6 Click Score: 17    End of Session Equipment Utilized During Treatment: Oxygen  Activity Tolerance: Patient limited by pain Patient left: in chair;with call bell/phone within reach Nurse Communication: Mobility status PT Visit Diagnosis: Unsteadiness on feet (R26.81);History of falling (Z91.81);Muscle weakness (generalized) (M62.81);Other abnormalities of gait and mobility (R26.89)     Time:  9150-9085 PT Time Calculation (min) (ACUTE ONLY): 25 min  Charges:    $Therapeutic Exercise: 8-22 mins $Therapeutic Activity: 8-22 mins PT General Charges $$ ACUTE PT VISIT: 1 Visit                     Lacinda Fass, PT, DPT  02/24/2024, 10:59 AM

## 2024-02-24 NOTE — Progress Notes (Addendum)
 Subjective: Doing okay this morning. States she is feeling some better. She reports some pain under her Right arm that radiates down into her Right side/abdomen or when she lifts her arm, reports pain began around 5 days ago. Pain worse with deep breath. Reports pain began about 1 week after a fall. No further diarrhea, no BMs today but did notice small amount of toilet tissue hematochezia when wiping this morning, she reports this was much smaller volume than previously.  No nausea or vomiting.    Objective: Vital signs in last 24 hours: Temp:  [97.6 F (36.4 C)-98.9 F (37.2 C)] 98.9 F (37.2 C) (09/24 0624) Pulse Rate:  [79-92] 79 (09/24 0624) Resp:  [20-24] 20 (09/24 0624) BP: (97-140)/(53-73) 137/64 (09/24 0624) SpO2:  [95 %-100 %] 97 % (09/24 0624) Weight:  [70.7 kg-76.9 kg] 70.7 kg (09/24 0500) Last BM Date : 02/23/24 General:   Alert and oriented, pleasant Head:  Normocephalic and atraumatic. Eyes:  No icterus, sclera clear. Conjuctiva pink.  Mouth:  Without lesions, mucosa pink and moist.  Heart:  S1, S2 present, no murmurs noted.  Lungs: Clear to auscultation bilaterally, without wheezing, rales, or rhonchi.  Abdomen:  Bowel sounds present, soft, non-tender, non-distended. No HSM or hernias noted. No rebound or guarding. No masses appreciated  Msk:  Symmetrical without gross deformities. Normal posture. Pain with palpation of R rib cage/R upper back Pulses:  Normal pulses noted. Extremities:  Without clubbing or edema. Neurologic:  Alert and  oriented x4;  grossly normal neurologically. Psych:  Alert and cooperative. Normal mood and affect.  Intake/Output from previous day: 09/23 0701 - 09/24 0700 In: 350 [I.V.:350] Out: 0  Intake/Output this shift: No intake/output data recorded.  Lab Results: Recent Labs    02/22/24 0509 02/24/24 0412  WBC 7.0  --   HGB 8.3* 7.9*  HCT 27.0* 25.9*  PLT 151  --    BMET Recent Labs    02/22/24 0509 02/24/24 0412  NA 131*  135  K 4.1 3.6  CL 98 97*  CO2 28 27  GLUCOSE 172* 216*  BUN 10 10  CREATININE 0.56 0.55  CALCIUM  7.4* 8.0*    Assessment: Amber Stephenson is a 78 y.o. female with history of endometrial cancer medically inoperable, s/p IMRT, Afib on Eliquis , bipolar 1 disorder, CAD, CHF, chronic pain, DM, HTN, Herpes, NAFLD, OSA, osteopenia, RLS, pulmonary hypertension, previous history of microscopic colitis, diverticulitis on May 2025 imaging, GERD, IDA, with GI bleed in August 2025 secondary to colonic AVMs and also finding of PUD on EGD, admitted with acute on chronic HF, AKI, GI consulted due to diarrhea, rectal bleeding on Eliquis , and abdominal pain.   Diarrhea: -C diff and GI path negative -hx of microscopic colitis in remote past -CT last week with descending sigmoid colonic wall thickening, suspect underdistention rather than inflammatory process -diarrhea resolved at this time -Colonoscopy yesterday as outlined below, path pending   Abdominal pain/R rib pain: -right sided, seems to radiate some from her axilla, reports present after a fall last week, worse with inspiration -intermittent, none postprandially -no gallstones on CT imaging -EGD and Colonoscopy yesterday as outlined below -no evidence of rib fractures on recent imaging though seems to have started after a fall  Suspect pain is musculoskeletal In nature, which I discussed with the patient, I am ordering a lidocaine  patch to see if this helps with her pain.   Rectal bleeding: -on eliquis  -multiple sources for bleeding including AVMs, hemorrhoids, Hx of  PUD -hgb on admission 8, though was 7.2 on 9/9  -s/p 1 unit PRBC transfusion on 9/16, hgb 7.9 this AM, Peaked at 8.8 on 9/17 -EGD and Colonoscopy yesterday with colonic angiodysplastic lesions-non bleeding, treated with APC and hemorrhoids (hemorrhoids suspected possibly to be source of bleeding) -Noted small amount of toilet tissue hematochezia today  Notably iron was  quite low on 9/9, husband is quite concerned about her low iron and low hgb. Will repeat IRon studies today. She may benefit from Iron infusion pending these results. We discussed it can take time for the levels to catch back up even with supplementation. She does feel better as far as weakness, fatigue.   EGD FINDINGS: - Normal esophagus.  - Erosive gastropathy with no stigmata of recent bleeding. Biopsied.  - Normal examined duodenum. Biopsied.   COLONOSCOPY FINDINGS: - Hemorrhoids found on perianal exam.  - The rectum, descending colon, transverse colon, ascending colon and cecum are normal. Biopsied.  - Multiple non-bleeding colonic angiodysplastic lesions. Treated with argon plasma coagulation (APC).  - Non-bleeding external and internal hemorrhoids.    Plan: Trend h&h  Monitor for overt GI bleeding Anusol  BID for hemorrhoids PPI BID Can resume Eliquis  today Repeat TCS in 2 months for re treatment for angiodysplastic lesions  Can try lidocaine  patch q12h for R rib pain  Iron studies    LOS: 9 days    02/24/2024, 8:49 AM   Hydee Fleece L. Mariette, MSN, APRN, AGNP-C Adult-Gerontology Nurse Practitioner [facility]  **Addendum: Iron panel reassuring, improved from previous on 9/9. Would recommend resuming PO iron BID, can hold off on parenteral iron at this time (had discussed possible Iron infusion with Dr. Ricky prior to iron panel resulting). Consider capsule endoscopy as outpatient if IDA persists.

## 2024-02-24 NOTE — TOC Transition Note (Signed)
 Transition of Care [facility]) - Discharge Note   Patient Details  Name: Amber Stephenson MRN: [id number] Date of Birth: June 01, 1946  [facility] of [facility]) CM/SW Contact:  Lucie Lunger, LCSWA Phone Number: 02/24/2024, 10:35 AM  Clinical Narrative:    CSW updated that pt is medically stable for D/C home today. CSW updated MD of need for [facility] PT/OT orders. CSW updated Lauraine with [employer] of plan for D/C home today, they will follow pt in the community. TOC signing off.   Final next level of care: Home w Home Health Services Barriers to Discharge: Barriers Resolved   Patient Goals and CMS Choice Patient states their goals for this hospitalization and ongoing recovery are:: return home CMS Medicare.gov Compare Post Acute Care list provided to:: Patient Choice offered to / list presented to : Patient, Spouse      Discharge Placement                       Discharge Plan and Services Additional resources added to the After Visit Summary for   In-house Referral: Clinical Social Work Discharge Planning Services: CM Consult                      HH Arranged: PT, OT HH Agency: [facility] Date [facility] Agency Contacted: 02/24/24 Time HH Agency Contacted: [phone] Representative spoke with at [facility] Agency: Lauraine  Social Drivers of Health (SDOH) Interventions SDOH Screenings   Food Insecurity: No Food Insecurity (02/15/2024)  Housing: Low Risk  (02/15/2024)  Transportation Needs: No Transportation Needs (02/15/2024)  Utilities: Not At Risk (02/15/2024)  Alcohol  Screen: Low Risk  (08/13/2023)  Depression (PHQ2-9): Low Risk  (02/15/2024)  Recent Concern: Depression (PHQ2-9) - High Risk (12/18/2023)  Financial Resource Strain: Low Risk  (08/13/2023)  Physical Activity: Insufficiently Active (08/13/2023)  Social Connections: Socially Integrated (02/15/2024)  Stress: No Stress Concern Present (08/13/2023)  Tobacco Use: Low Risk  (02/23/2024)  Health Literacy: Adequate Health Literacy  (08/13/2023)     Readmission Risk Interventions    02/16/2024   11:16 AM 01/20/2024    3:33 PM 01/20/2024    3:29 PM  Readmission Risk Prevention Plan  Transportation Screening Complete  Complete  Medication Review (RN Care Manager) Complete  Complete  PCP or Specialist appointment within 3-5 days of discharge   Not Complete  HRI or Home Care Consult Complete  Complete  SW Recovery Care/Counseling Consult Complete Complete Not Complete  Palliative Care Screening Not Applicable  Not Applicable  Skilled Nursing Facility Not Applicable  Not Applicable

## 2024-02-24 NOTE — Inpatient Diabetes Management (Signed)
 Inpatient Diabetes Program Recommendations  AACE/ADA: New Consensus Statement on Inpatient Glycemic Control (2015)  Target Ranges:  Prepandial:   less than 140 mg/dL      Peak postprandial:   less than 180 mg/dL (1-2 hours)      Critically ill patients:  140 - 180 mg/dL   Lab Results  Component Value Date   GLUCAP 216 (H) 02/24/2024   HGBA1C 4.9 02/16/2024    Review of Glycemic Control  Latest Reference Range & Units 02/23/24 16:45 02/23/24 20:38 02/24/24 07:12  Glucose-Capillary 70 - 99 mg/dL 768 (H) 757 (H) 783 (H)   Diabetes history: DM 2 Outpatient Diabetes medications:  Mounjaro  12. 5 mg q Sunday, Metformin  1000 mg daily, Humalog  0-10 units tid with meals, Lantus  10 units daily Current orders for Inpatient glycemic control:  Novolog  0-9 units tid with meals and HS  Inpatient Diabetes Program Recommendations:    Please add Lantus  10 units daily (this is home dose).   Thanks,  Randall Bullocks, RN, BC-ADM Inpatient Diabetes Coordinator Pager [phone]  (8a-5p)

## 2024-02-24 NOTE — Care Management Important Message (Signed)
 Important Message  Patient Details  Name: Amber Stephenson MRN: [id number] Date of Birth: [birth year]   Important Message Given:  Yes - Medicare IM     [name] 02/24/2024, 2:24 PM

## 2024-02-24 NOTE — Progress Notes (Signed)
 Progress Note  Patient: Amber Stephenson FMW:[id number] DOB: [birth year] DOA: 02/15/2024     9 DOS: the patient was seen and examined on 02/24/2024   Brief hospital admission narrative:  Amber Stephenson is a 78 y.o. female with medical history significant of endometrial cancer, atrial fibrillation on Eliquis , bipolar disorder type I, chronic pain syndrome, coronary artery disease, chronic kidney disease stage IIIa, type 2 diabetes with nephropathy, hypertension, nonalcoholic fatty liver disease, obstructive sleep apnea, restless leg syndrome and pulmonary hypertension admitted on 01/15/2024 with acute on chronic anemia due to presumed lower GI/rectal bleeding/acute blood loss anemia -Eliquis  on hold, awaiting GI service clearance.  Most likely home tomorrow.  Assessment and plan 1-acute blood loss anemia, melanotic diarrhea, intermittent abdominal pain -- Continue PPI twice a day. 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 - Despite clearance/diet advancement patient without much appetite (improving). - CT abdomen and pelvis will be repeated in the setting of left lower quadrant pain and distention appreciated on exam. -Eliquis  had been on hold for over 5 days -- Eliquis  to be restarted once cleared by GI. -status post colonoscopy and EGD demonstrating AVM's s/p argon therapy. -There was also findings of hemorrhoids and Anusol  has been recommended. - Patient will continue PPI twice a day. -- No further GI/Rectal bleeding on 02/22/2024, Hgb remains  > 8 after 1 unit PRBC transfused on 02/17/24 - If EGD and colonoscopy on 02/23/2024 is without significant colonic findings may restart Eliquis  and monitor hemoglobin for 24 to 48 hours prior to discharge  2-acute on chronic diastolic heart failure - Echo with EF of 50 to 55%, mild mitral stenosis, no aortic stenosis - Continue daily weights, strict I's and O's and low-sodium diet - -Generalized edema and tach patient  improving after receiving diuresis with IV Lasix  with IV albumin  - Continue to follow renal function and electrolytes - Improving leg edema/anasarca with anterior abdominal wall edema noted - Continue diuresis  3-hypokalemia and hypomagnesemia  -secondary to GI loses, bowel prep and diuresis - Continue to replete as needed and follow trend.    4-depression/anxiety - Continue Lexapro , Abilify  and as needed Xanax .  5-hyperlipidemia - Continue statin.  6-acute kidney injury - Appears to be in the setting of decreased perfusion from CHF and ongoing anemia - Creatinine improved after diuresis initiated - Continue to follow renal function trend -Upon further review patient does Not appear to have a history of confirmed CKD per se - continue to Avoid nephrotoxic agents, hypotension and the use of contrast.  7)Klebsiella UTI--per patient dysuria improving - Last dose of Rocephin  will be 02/24/2024; antibiotics will be transitioned to keflex .  8-type 2 diabetes mellitus--A1c is 4.9 reflecting excellent diabetic control PTA - -sliding scale insulin  to cover CBGs above 200. - Patient following diet control as an outpatient. - Continue monitoring CBG fluctuation.  9-class I obesity -Body mass index is 28.51 kg/m.  -Low-calorie diet and portion control discussed with patient.  10-restless leg syndrome/chronic pain - Continue treatment with Requip  and home analgesic regimen. - PTA patient was on OxyContin  20 mg twice daily - Currently receiving IV Dilaudid  0.5 every 6 hours as needed for breakthrough pain  11)Chronic Hyponatremia--- sodium levels not too far from prior baselines - No further vomiting or diarrhea - Continue to follow electrolytes trend intermittently.  12) physical therapy and deconditioning - Patient has been seen by PT with recommendation for home health services at discharge.   Subjective:  No fever, no chest pain, no  nausea or vomiting.  Overall reports feeling  better.  Still complaining of right-sided abdominal discomfort today (flank area).  Patient also expressed feeling weak and deconditioned.  No overt bleeding, diarrhea or complaints of being short of breath.  Physical Exam: Vitals:   02/23/24 2036 02/24/24 0500 02/24/24 0624 02/24/24 1326  BP: (!) 140/73  137/64 (!) 144/79  Pulse: 92  79 80  Resp: 20  20 17   Temp: 97.6 F (36.4 C)  98.9 F (37.2 C)   TempSrc: Oral  Oral   SpO2: 98%  97% 97%  Weight:  70.7 kg    Height:        Physical Exam  General exam: Alert, awake, oriented x 3; in no acute distress and reporting no overt bleeding. Respiratory system: Good saturation on chronic supplementation; no frank crackles, no wheezing, no using accessory muscle. Cardiovascular system: Rate controlled, no rubs, no gallops, no JVD. Gastrointestinal system: Abdomen is nondistended, soft and reporting some sided mid abdomen/flank pain.  Positive bowel sounds.  No guarding. Central nervous system: Alert and oriented. No focal neurological deficits. Extremities: No cyanosis, clubbing or edema. Skin: No petechiae. Psychiatry: Judgement and insight appear normal. Mood & affect appropriate.    Family Communication: Husband at bedside.  Disposition: Home Status is: Inpatient  Author: Eric Nunnery, MD 02/24/2024 6:45 PM  For on call review [email or web address].

## 2024-02-25 ENCOUNTER — Telehealth: Payer: Self-pay | Admitting: Gastroenterology

## 2024-02-25 DIAGNOSIS — Z794 Long term (current) use of insulin: Secondary | ICD-10-CM

## 2024-02-25 DIAGNOSIS — I5033 Acute on chronic diastolic (congestive) heart failure: Secondary | ICD-10-CM | POA: Diagnosis not present

## 2024-02-25 DIAGNOSIS — E119 Type 2 diabetes mellitus without complications: Secondary | ICD-10-CM | POA: Diagnosis not present

## 2024-02-25 DIAGNOSIS — K921 Melena: Secondary | ICD-10-CM | POA: Diagnosis not present

## 2024-02-25 DIAGNOSIS — R1031 Right lower quadrant pain: Secondary | ICD-10-CM | POA: Diagnosis not present

## 2024-02-25 LAB — BASIC METABOLIC PANEL WITH GFR
Anion gap: 8 (ref 5–15)
BUN: 7 mg/dL — ABNORMAL LOW (ref 8–23)
CO2: 27 mmol/L (ref 22–32)
Calcium: 8.3 mg/dL — ABNORMAL LOW (ref 8.9–10.3)
Chloride: 98 mmol/L (ref 98–111)
Creatinine, Ser: 0.51 mg/dL (ref 0.44–1.00)
GFR, Estimated: >60 mL/min (ref >60)
Glucose, Bld: 200 mg/dL — ABNORMAL HIGH (ref 70–99)
Potassium: 3.5 mmol/L (ref 3.5–5.1)
Sodium: 133 mmol/L — ABNORMAL LOW (ref 135–145)

## 2024-02-25 LAB — CBC
HCT: 26.9 % — ABNORMAL LOW (ref 36.0–46.0)
Hemoglobin: 8.2 g/dL — ABNORMAL LOW (ref 12.0–15.0)
MCH: 25.8 pg — ABNORMAL LOW (ref 26.0–34.0)
MCHC: 30.5 g/dL (ref 30.0–36.0)
MCV: 84.6 fL (ref 80.0–100.0)
Platelets: 169 K/uL (ref 150–400)
RBC: 3.18 MIL/uL — ABNORMAL LOW (ref 3.87–5.11)
RDW: 19.3 % — ABNORMAL HIGH (ref 11.5–15.5)
WBC: 5.6 K/uL (ref 4.0–10.5)
nRBC: 0 % (ref 0.0–0.2)

## 2024-02-25 LAB — GLUCOSE, CAPILLARY
Glucose-Capillary: 217 mg/dL — ABNORMAL HIGH (ref 70–99)
Glucose-Capillary: 349 mg/dL — ABNORMAL HIGH (ref 70–99)

## 2024-02-25 MED ORDER — LIDOCAINE 5 % EX PTCH: 1.0000 | MEDICATED_PATCH | CUTANEOUS | 0 refills | Status: AC

## 2024-02-25 MED ORDER — COLCHICINE 0.6 MG PO TABS: 0.6000 mg | ORAL_TABLET | Freq: Two times a day (BID) | ORAL | Status: AC | PRN

## 2024-02-25 MED ORDER — VITAMIN C 500 MG PO TABS: 250.0000 mg | ORAL_TABLET | Freq: Two times a day (BID) | ORAL | 2 refills | Status: AC

## 2024-02-25 MED ORDER — FERROUS SULFATE 325 (65 FE) MG PO TBEC
325.0000 mg | DELAYED_RELEASE_TABLET | Freq: Two times a day (BID) | ORAL | 3 refills | Status: AC
Start: 2024-02-25 — End: 2025-02-24

## 2024-02-25 MED ORDER — HYDROCORTISONE (PERIANAL) 2.5 % EX CREA: TOPICAL_CREAM | Freq: Two times a day (BID) | CUTANEOUS | 0 refills | Status: AC

## 2024-02-25 MED ORDER — CEFADROXIL 500 MG PO CAPS: 500.0000 mg | ORAL_CAPSULE | Freq: Two times a day (BID) | ORAL | 0 refills | Status: AC

## 2024-02-25 MED ORDER — FUROSEMIDE 40 MG PO TABS: ORAL_TABLET | ORAL | 3 refills | Status: DC

## 2024-02-25 NOTE — Telephone Encounter (Signed)
 Patient needs hospital follow up in 3-4 weeks with Charmaine or Therisa, patient will need colonoscopy end of 04/2024.

## 2024-02-25 NOTE — Telephone Encounter (Signed)
 OK I spoke to him today and he is aware we will be calling for appointment. Patient has been discharged. Please make sure she is placed on Courtney or Anna's schedule as they are familiar.

## 2024-02-25 NOTE — Discharge Summary (Signed)
 Physician Discharge Summary   Patient: Amber Stephenson MRN: [id number] DOB: 1945/10/01  Admit date:     02/15/2024  Discharge date: 02/25/24  Discharge Physician: Eric Nunnery   PCP: [name], MD   Recommendations at discharge:  Repeat CBC to follow hemoglobin trend/stability Repeat basic metabolic panel to follow electrolytes and renal function Continue to closely follow patient CBGs with further adjustment to hypoglycemia regimen as required Reassess blood pressure and adjust antihypertensive treatment as needed. Make sure patient follow-up with gastroenterology service as recommended.  Discharge Diagnoses: Principal Problem:   Acute on chronic diastolic heart failure (HCC) Active Problems:   AKI (acute kidney injury)   Hematochezia   RLQ abdominal pain Class I obesity  Brief Hospital admission narrative: Amber Stephenson is a 78 y.o. female with medical history significant of endometrial cancer, atrial fibrillation on Eliquis , bipolar disorder type I, chronic pain syndrome, coronary artery disease, chronic kidney disease stage IIIa, type 2 diabetes with nephropathy, hypertension, nonalcoholic fatty liver disease, obstructive sleep apnea, restless leg syndrome and pulmonary hypertension; who presented to the emergency department secondary to melanotic diarrhea, intermittent abdominal pain and increased shortness of breath.  Patient reports symptom has been present for the last 3-4 days and worsening.  No associated nausea, no vomiting, no sick contacts, no changes in her medications.   She was seen by her PCP on the day of admission where she was instructed to presented to the ED for further evaluation, management and possible blood transfusion.   Workup demonstrated hemoglobin of 8, renal function slightly worsened from baseline with a creatinine up to 1.3 and elevated BNP of 664 with a chest x-ray demonstrating cardiomegaly and vascular congestion.   GI service was  consulted, GI panel requested and patient started on IV Lasix .  TRH has been consulted to place patient in the hospital for further evaluation and management.  Assessment and Plan: 1-acute blood loss anemia, melanotic diarrhea, intermittent abdominal pain -- Continue PPI twice a day. - Patient with underlying chronic anemia - CT abdomen and pelvis will be repeated in the setting of left lower quadrant pain and distention appreciated on exam. -status post colonoscopy and EGD demonstrating AVM's s/p argon therapy. -There was also findings of hemorrhoids and Anusol  has been recommended. - Patient will continue PPI twice a day. -- No further GI/Rectal bleeding on 02/22/2024, Hgb remains  > 8 after 1 unit PRBC transfused on 02/17/24 - Safe to restart Eliquis . -To continue assistant with a stabilization of patient's hemoglobin vitamin C  and oral iron supplementation has been recommended at discharge.   2-acute on chronic diastolic heart failure - Echo with EF of 50 to 55%, mild mitral stenosis, no aortic stenosis - Continue daily weights, adequate hydration and low-sodium diet - Patient received treatment with IV Lasix  and albumin  with excellent response to fluid overload/anasarca. -Diuretics has been adjusted at time of discharge to continue helping stabilizing volume status.   3-hypokalemia and hypomagnesemia  -secondary to GI loses, bowel prep and diuresis - Continue to replete as needed and follow trend.     4-depression/anxiety - Continue Lexapro , Abilify  and as needed Xanax .   5-hyperlipidemia - Continue statin.   6-acute kidney injury - Appears to be in the setting of decreased perfusion from CHF and ongoing anemia - Creatinine improved after diuresis initiated - Continue to follow renal function trend -Upon further review patient does Not appear to have a history of confirmed CKD per se - continue to Avoid nephrotoxic agents, hypotension and  the use of contrast.   7)Klebsiella  UTI--per patient dysuria improving - Last dose of Rocephin  will be 02/24/2024; antibiotics will be transitioned to keflex .   8-type 2 diabetes mellitus--A1c is 4.9 reflecting excellent diabetic control PTA - -sliding scale insulin  to cover CBGs above 200. - Patient following diet control as an outpatient. - Continue monitoring CBG fluctuation.   9-class I obesity -Body mass index is 28.51 kg/m.  -Low-calorie diet and portion control discussed with patient.   10-restless leg syndrome/chronic pain - Continue treatment with Requip  and home analgesic regimen. - PTA patient was on OxyContin  20 mg twice daily - Currently receiving IV Dilaudid  0.5 every 6 hours as needed for breakthrough pain   11)Chronic Hyponatremia--- sodium levels not too far from prior baselines - No further vomiting or diarrhea - Continue to follow electrolytes trend intermittently.   12) physical therapy and deconditioning - Patient has been seen by PT with recommendation for home health services at discharge.   Consultants: Gastroenterology service Procedures performed: See below for x-ray report; EGD/colonoscopy. Disposition: Home with home health services. Diet recommendation: Heart healthy/modified carbohydrate diet.  DISCHARGE MEDICATION: Allergies as of 02/25/2024       Reactions   Iodinated Contrast Media Anaphylaxis, Swelling, Other (See Comments)   Throat closes, swelling of throat and tongue   Iodine Anaphylaxis   Latex Other (See Comments)   Latex tape pulls skin with it   Tape Other (See Comments)   Pulls off the skin, if latex   Tizanidine  Other (See Comments)   Weakness, goofy, bad dreams        Medication List     STOP taking these medications    metFORMIN  500 MG 24 hr tablet Commonly known as: GLUCOPHAGE -XR       TAKE these medications    3-in-1 Bedside Toilet Misc Length of need 99 months M17.11 Osteoarthritis right knee / multiple falls / high risk fall   acetaminophen   325 MG tablet Commonly known as: TYLENOL  Take 2 tablets (650 mg total) by mouth every 6 (six) hours as needed for mild pain (pain score 1-3) or fever (or Fever >/= 101).   ALPRAZolam  1 MG tablet Commonly known as: XANAX  Take 1 mg by mouth at bedtime as needed for sleep.   ARIPiprazole  2 MG tablet Commonly known as: ABILIFY  Take 1 tablet (2 mg total) by mouth at bedtime.   ascorbic acid 500 MG tablet Commonly known as: VITAMIN C  Take 0.5 tablets (250 mg total) by mouth 2 (two) times daily.   atorvastatin  80 MG tablet Commonly known as: LIPITOR Take 1 tablet (80 mg total) by mouth daily. What changed: when to take this   carbidopa -levodopa  10-100 MG tablet Commonly known as: Sinemet  Take 1 tablet by mouth 4 (four) times daily. For restless leg syndrome   cefadroxil  500 MG capsule Commonly known as: DURICEF Take 1 capsule (500 mg total) by mouth 2 (two) times daily for 4 days.   colchicine  0.6 MG tablet Take 1 tablet (0.6 mg total) by mouth every 12 (twelve) hours as needed (gout flare.). For mouth sores What changed:  when to take this reasons to take this   cyanocobalamin  1000 MCG tablet Commonly known as: VITAMIN B12 Take 1 tablet (1,000 mcg total) by mouth daily.   Dexcom G7 Sensor Misc CHANGE SENSOR EVERY 10 DAYS   diphenoxylate -atropine  2.5-0.025 MG tablet Commonly known as: LOMOTIL  TAKE 2 TABLETS BY MOUTH FOUR TIMES DAILY AS NEEDED FOR DIARRHEA OR LOOSE STOOLS What changed:  See the new instructions.   Eliquis  5 MG Tabs tablet Generic drug: apixaban  TAKE 1 TABLET TWICE A DAY   escitalopram  10 MG tablet Commonly known as: LEXAPRO  Take 10 mg by mouth daily.   ferrous sulfate  325 (65 FE) MG EC tablet Take 1 tablet (325 mg total) by mouth 2 (two) times daily.   furosemide  40 MG tablet Commonly known as: LASIX  Take 40 mg by mouth in the morning and 20 mg in the evening. What changed:  how much to take how to take this when to take this additional  instructions   gabapentin  100 MG capsule Commonly known as: NEURONTIN  Take 1 capsule (100 mg total) by mouth 3 (three) times daily.   hydrocortisone  2.5 % rectal cream Commonly known as: ANUSOL -HC Place rectally 2 (two) times daily.   hydrOXYzine  25 MG capsule Commonly known as: VISTARIL  Take 1 capsule (25 mg total) by mouth every 8 (eight) hours as needed for anxiety.   insulin  lispro 100 UNIT/ML KwikPen Commonly known as: HumaLOG  KwikPen Inject 0-10 Units into the skin 2 (two) times daily before a meal. Sliding scale Short acting Humalog  insulin  per sliding scale 0-10 ---:-- Insulin  injection 0-10 Units 0-10 Units Subcutaneous, 3 times daily with meals CBG 70 - 120: 0 unit CBG 121 - 150: 0 unit  CBG 151 - 200: 1 unit CBG 201 - 250: 2 units CBG 251 - 300: 4 units CBG 301 - 350: 6 units  CBG 351 - 400: 8 units  CBG > 400: 10 units   Insulin  Pen Needle 29G X Misc 1 Device by Does not apply route daily in the afternoon.   Lantus  SoloStar 100 UNIT/ML Solostar Pen Generic drug: insulin  glargine Inject 10 Units into the skin at bedtime.   lidocaine  5 % Commonly known as: LIDODERM  Place 1 patch onto the skin daily. Remove & Discard patch within 12 hours or as directed by MD Start taking on: February 26, 2024   losartan  25 MG tablet Commonly known as: COZAAR  Take 25 mg by mouth daily.   MAGNESIUM  PO Take 1 tablet by mouth daily.   Methocarbamol  1000 MG Tabs Take 1,000 mg by mouth every 6 (six) hours as needed. What changed: reasons to take this   metoprolol  tartrate 50 MG tablet Commonly known as: LOPRESSOR  Take 1 tablet (50 mg total) by mouth 2 (two) times daily.   ondansetron  4 MG tablet Commonly known as: Zofran  Take 1 tablet (4 mg total) by mouth every 8 (eight) hours as needed for nausea or vomiting. For cancer related nausea   OXcarbazepine  150 MG tablet Commonly known as: TRILEPTAL  Take 1 tablet (150 mg total) by mouth 2 (two) times daily.   oxyCODONE  20 mg  12 hr tablet Commonly known as: OxyCONTIN  Take 1 tablet (20 mg total) by mouth every 12 (twelve) hours. G89.3- chronic pain- fill 28 days after 8/25- Rx   pantoprazole  40 MG tablet Commonly known as: PROTONIX  Take 1 tablet (40 mg total) by mouth 2 (two) times daily.   potassium chloride  10 MEQ tablet Commonly known as: KLOR-CON  Take 1 tablet (10 mEq total) by mouth daily. What changed: when to take this   rOPINIRole  2 MG tablet Commonly known as: REQUIP  Take 1 tablet (2 mg total) by mouth at bedtime.   tirzepatide  12.5 MG/0.5ML Pen Commonly known as: MOUNJARO  Inject 12.5 mg into the skin once a week. What changed: when to take this   Vitamin D  (Cholecalciferol ) 25 MCG (1000 UT) Tabs  Take 1,000 Units by mouth in the morning.        Follow-up Information     [facility]. Go on 03/02/2024.   Specialty: Cardiology Why: Hospital Follow-Up 03/02/24 @ 9:45 Please bring all medications to follow-up appointment [facility] @ Vascular.  Off of [address].  Your looking for Entrance C.  Free Valet Parking at the door or Parking under the building using (GATE CODE [id number]). Contact information: [address] [place]  [phone]        [facility] Follow up.   Why: Agency will call to set up home therapy visits.        [name], MD. Schedule an appointment as soon as possible for a visit in 1 week(s).   Specialty: Family Medicine Contact information: [address] [place] [address] [phone]                Discharge Exam: Fredricka Weights   02/23/24 1039 02/24/24 0500 02/25/24 0449  Weight: 76.9 kg 70.7 kg 76.3 kg   General exam: Alert, awake, oriented x 3; in no acute distress and reporting no overt bleeding. Respiratory system: Good saturation on chronic supplementation; no frank crackles, no wheezing, no using accessory muscle. Cardiovascular system: Rate  controlled, no rubs, no gallops, no JVD. Gastrointestinal system: Abdomen is nondistended, soft and reporting some sided mid abdomen/flank pain.  Positive bowel sounds.  No guarding. Central nervous system: Alert and oriented. No focal neurological deficits. Extremities: No cyanosis, clubbing or edema. Skin: No petechiae. Psychiatry: Judgement and insight appear normal. Mood & affect appropriate.    Condition at discharge: Stable and improved.  The results of significant diagnostics from this hospitalization (including imaging, microbiology, ancillary and laboratory) are listed below for reference.   Imaging Studies: CT ABDOMEN PELVIS WO CONTRAST Result Date: 02/19/2024 CLINICAL DATA:  Left lower quadrant abdominal pain and diarrhea. EXAM: CT ABDOMEN AND PELVIS WITHOUT CONTRAST TECHNIQUE: Multidetector CT imaging of the abdomen and pelvis was performed following the standard protocol without IV contrast. RADIATION DOSE REDUCTION: This exam was performed according to the departmental dose-optimization program which includes automated exposure control, adjustment of the mA and/or kV according to patient size and/or use of iterative reconstruction technique. COMPARISON:  02/15/2024 FINDINGS: Lower chest: Interstitial and patchy alveolar ground-glass opacity is identified in both lung bases, similar to minimally progressive in the interval. No substantial pleural effusion. Hepatobiliary: No suspicious focal abnormality in the liver on this study without intravenous contrast. There is no evidence for gallstones, gallbladder wall thickening, or pericholecystic fluid. No intrahepatic or extrahepatic biliary dilation. Pancreas: No focal mass lesion. No dilatation of the main duct. No intraparenchymal cyst. No peripancreatic edema. Spleen: No splenomegaly. No suspicious focal mass lesion. Adrenals/Urinary Tract: Right adrenal gland unremarkable. 11 mm low-density left adrenal nodule stable since prior and  comparing back to a study from 2022 compatible with benign etiology, likely adenoma. Kidneys unremarkable. No evidence for hydroureter. The urinary bladder appears normal for the degree of distention. Stomach/Bowel: Stomach is unremarkable. No gastric wall thickening. No evidence of outlet obstruction. Duodenum is normally positioned as is the ligament of Treitz. Duodenal diverticulum noted. No small bowel wall thickening. No small bowel dilatation. The terminal ileum is normal. The appendix is normal. No gross colonic mass. No colonic wall thickening. Vascular/Lymphatic: There is mild atherosclerotic calcification of the abdominal aorta without aneurysm. There is no gastrohepatic or hepatoduodenal ligament lymphadenopathy. No retroperitoneal or mesenteric  lymphadenopathy. No pelvic sidewall lymphadenopathy. Reproductive: The uterus is unremarkable.  There is no adnexal mass. Other: No intraperitoneal free fluid. Musculoskeletal: Small right groin hernia contains only fat. Diffuse body wall edema evident. No worrisome lytic or sclerotic osseous abnormality. IMPRESSION: 1. No acute findings in the abdomen or pelvis. Specifically, no findings to explain the patient's history of left lower quadrant pain and diarrhea. 2. Interstitial and patchy alveolar ground-glass opacity in both lung bases, similar to minimally progressive in the interval. Imaging features may reflect edema although infectious/inflammatory etiology not excluded. 3. Diffuse body wall edema. 4. Small right groin hernia contains only fat. 5.  Aortic Atherosclerosis (ICD10-I70.0). Electronically Signed   By: Camellia Candle M.D.   On: 02/19/2024 09:54   ECHOCARDIOGRAM COMPLETE Result Date: 02/17/2024    ECHOCARDIOGRAM REPORT   Patient Name:   SONYIA MURO Sahr Date of Exam: 02/17/2024 Medical Rec #:  [id number]         Height:       62.0 in Accession #:    [id number]        Weight:       182.8 lb Date of Birth:  [birth year]          BSA:          1.840 m  Patient Age:    78 years          BP:           140/86 mmHg Patient Gender: F                 HR:           76 bpm. Exam Location:  [name] Procedure: 2D Echo, Color Doppler and Cardiac Doppler (Both Spectral and Color            Flow Doppler were utilized during procedure). Indications:    CHF I50.31  History:        Patient has prior history of Echocardiogram examinations, most                 recent 12/14/2020. CHF and Cardiomegaly, Arrythmias:Atrial                 Fibrillation; Risk Factors:Diabetes and Dyslipidemia. H/O                 Obstructive sleep apnea, Hypotension, Hypertension.  Sonographer:    BERNARDA ROCKS Referring Phys: [email or web address].Bombard Colie Josten IMPRESSIONS  1. Left ventricular ejection fraction, by estimation, is 50 to 55%. The left ventricle has low normal function. Left ventricular endocardial border not optimally defined to evaluate regional wall motion. Left ventricular diastolic function could not be evaluated.  2. Right ventricular systolic function is moderately reduced. The right ventricular size is moderately enlarged. There is normal pulmonary artery systolic pressure. The estimated right ventricular systolic pressure is 28.8 mmHg.  3. Left atrial size was severely dilated.  4. Right atrial size was severely dilated.  5. The mitral valve is degenerative. Mild mitral valve regurgitation. Mild mitral stenosis. The mean mitral valve gradient is 4.0 mmHg. Severe mitral annular calcification.  6. The aortic valve is tricuspid. Aortic valve regurgitation is mild. Aortic valve sclerosis/calcification is present, without any evidence of aortic stenosis. Aortic regurgitation PHT measures 924 msec. Aortic valve area, by VTI measures 2.58 cm. Aortic valve mean gradient measures 2.0 mmHg. Aortic valve Vmax measures 1.09 m/s.  7. The inferior vena cava is dilated in size with <50% respiratory variability, suggesting right atrial pressure of  15 mmHg.  8. Recommend repeat limited study with definity  contrast to evaluate LVF and wall motion more accurately FINDINGS  Left Ventricle: Left ventricular ejection fraction, by estimation, is 50 to 55%. The left ventricle has low normal function. Left ventricular endocardial border not optimally defined to evaluate regional wall motion. The left ventricular internal cavity  size was normal in size. There is no left ventricular hypertrophy. Left ventricular diastolic function could not be evaluated due to atrial fibrillation. Left ventricular diastolic function could not be evaluated. Right Ventricle: The right ventricular size is moderately enlarged. No increase in right ventricular wall thickness. Right ventricular systolic function is moderately reduced. There is normal pulmonary artery systolic pressure. The tricuspid regurgitant velocity is 1.86 m/s, and with an assumed right atrial pressure of 15 mmHg, the estimated right ventricular systolic pressure is 28.8 mmHg. Left Atrium: Left atrial size was severely dilated. Right Atrium: Right atrial size was severely dilated. Pericardium: There is no evidence of pericardial effusion. Mitral Valve: The mitral valve is degenerative in appearance. There is mild thickening of the mitral valve leaflet(s). There is mild calcification of the mitral valve leaflet(s). Severe mitral annular calcification. Mild mitral valve regurgitation. Mild mitral valve stenosis. MV peak gradient, 11.3 mmHg. The mean mitral valve gradient is 4.0 mmHg. Tricuspid Valve: The tricuspid valve is normal in structure. Tricuspid valve regurgitation is trivial. No evidence of tricuspid stenosis. Aortic Valve: The aortic valve is tricuspid. Aortic valve regurgitation is mild. Aortic regurgitation PHT measures 924 msec. Aortic valve sclerosis/calcification is present, without any evidence of aortic stenosis. Aortic valve mean gradient measures 2.0  mmHg. Aortic valve peak gradient measures 4.8 mmHg. Aortic valve area, by VTI measures 2.58 cm. Pulmonic  Valve: The pulmonic valve was normal in structure. Pulmonic valve regurgitation is trivial. No evidence of pulmonic stenosis. Aorta: The aortic root is normal in size and structure. Venous: The inferior vena cava is dilated in size with less than 50% respiratory variability, suggesting right atrial pressure of 15 mmHg. IAS/Shunts: No atrial level shunt detected by color flow Doppler.  LEFT VENTRICLE PLAX 2D LVIDd:         4.60 cm      Diastology LVIDs:         3.60 cm      LV e' medial:    7.62 cm/s LV PW:         1.10 cm      LV E/e' medial:  21.4 LV IVS:        1.10 cm      LV e' lateral:   11.30 cm/s LVOT diam:     2.10 cm      LV E/e' lateral: 14.4 LV SV:         51 LV SV Index:   28 LVOT Area:     3.46 cm  LV Volumes (MOD) LV vol d, MOD A2C: 127.0 ml LV vol d, MOD A4C: 105.0 ml LV vol s, MOD A2C: 57.7 ml LV vol s, MOD A4C: 44.3 ml LV SV MOD A2C:     69.3 ml LV SV MOD A4C:     105.0 ml LV SV MOD BP:      65.9 ml RIGHT VENTRICLE            IVC RV Basal diam:  4.60 cm    IVC diam: 2.80 cm RV S prime:     6.65 cm/s TAPSE (M-mode): 1.0 cm LEFT ATRIUM  Index        RIGHT ATRIUM           Index LA diam:        5.20 cm  2.83 cm/m   RA Area:     28.50 cm LA Vol (A2C):   139.0 ml 75.55 ml/m  RA Volume:   94.40 ml  51.31 ml/m LA Vol (A4C):   126.0 ml 68.48 ml/m LA Biplane Vol: 134.0 ml 72.83 ml/m  AORTIC VALVE                    PULMONIC VALVE AV Area (Vmax):    2.26 cm     PV Vmax:          0.76 m/s AV Area (Vmean):   2.27 cm     PV Peak grad:     2.3 mmHg AV Area (VTI):     2.58 cm     PR End Diast Vel: 6.22 msec AV Vmax:           109.00 cm/s AV Vmean:          68.400 cm/s AV VTI:            0.199 m AV Peak Grad:      4.8 mmHg AV Mean Grad:      2.0 mmHg LVOT Vmax:         71.00 cm/s LVOT Vmean:        44.900 cm/s LVOT VTI:          0.148 m LVOT/AV VTI ratio: 0.74 AI PHT:            924 msec  AORTA Ao Root diam: 3.60 cm Ao Asc diam:  3.70 cm MITRAL VALVE                TRICUSPID VALVE MV Area  (PHT): 5.13 cm     TR Peak grad:   13.8 mmHg MV Area VTI:   1.59 cm     TR Vmax:        186.00 cm/s MV Peak grad:  11.3 mmHg MV Mean grad:  4.0 mmHg     SHUNTS MV Vmax:       1.68 m/s     Systemic VTI:  0.15 m MV Vmean:      85.8 cm/s    Systemic Diam: 2.10 cm MV Decel Time: 148 msec MR Peak grad: 80.6 mmHg MR Vmax:      449.00 cm/s MV E velocity: 163.00 cm/s MV A velocity: 59.40 cm/s MV E/A ratio:  2.74 Wilbert Bihari MD Electronically signed by Wilbert Bihari MD Signature Date/Time: 02/17/2024/2:01:15 PM    Final    CT ABDOMEN PELVIS WO CONTRAST Result Date: 02/15/2024 CLINICAL DATA:  Right lower quadrant abdominal pain.  Diarrhea. EXAM: CT ABDOMEN AND PELVIS WITHOUT CONTRAST TECHNIQUE: Multidetector CT imaging of the abdomen and pelvis was performed following the standard protocol without IV contrast. RADIATION DOSE REDUCTION: This exam was performed according to the departmental dose-optimization program which includes automated exposure control, adjustment of the mA and/or kV according to patient size and/or use of iterative reconstruction technique. COMPARISON:  01/19/2024 FINDINGS: Lower chest: Substantial cardiomegaly. Low-density blood pool pool favoring anemia. Dense mitral valve calcification. Mild aortic valve calcification. Mosaic attenuation at the lung bases with some reticular interstitial accentuation, similar to the prior exam. Faint nodularity inferiorly in the right middle lobe likewise similar to the prior exam. Hepatobiliary: Unremarkable Pancreas: Unremarkable Spleen: Unremarkable Adrenals/Urinary Tract: Stable  appearance of the adrenal glands and kidneys, no hydronephrosis or hydroureter. Borderline urinary bladder wall thickening, cystitis not excluded, correlate with urine analysis. Stomach/Bowel: Proximal transverse duodenal diverticulum. Normal appendix. No dilated bowel to indicate obstruction. Numerous jejunal diverticular present. None of these appear overtly inflamed. Equivocal wall  thickening in the descending and sigmoid colon, much of the appearance may be from nondistention rather than necessarily from colitis/inflammation. Vascular/Lymphatic: Atherosclerosis is present, including aortoiliac atherosclerotic disease. Atheromatous plaque at the origins of the celiac trunk and SMA. Patency not assessed on today's noncontrast examination. No pathologic adenopathy observed. Reproductive: Unremarkable Other: Small amount of ascites mainly along the left paracolic gutter. Presacral edema. Musculoskeletal: Subcutaneous edema in the anterior pannus, cannot exclude panniculitis. Right groin hernia contains adipose tissue. Notable subcutaneous edema also present laterally along the right upper thigh. Rib deformities associated with healing left anterior fifth and sixth rib fractures. IMPRESSION: 1. Equivocal wall thickening in the descending and sigmoid colon, much of the appearance may be from nondistention rather than necessarily from colitis/inflammation. 2. Small amount of ascites mainly along the left paracolic gutter. 3. Subcutaneous edema in the anterior pannus, cannot exclude panniculitis. 4. Substantial cardiomegaly. Low-density blood pool favoring anemia. 5. Nonspecific mosaic attenuation at the lung bases with some reticular interstitial accentuation, similar to the prior exam. Faint nodularity inferiorly in the right middle lobe likewise similar to the prior exam. 6. Right groin hernia contains adipose tissue. 7. Notable subcutaneous edema laterally along the right upper thigh. 8. Healing left anterior fifth and sixth rib fractures. 9.  Aortic Atherosclerosis (ICD10-I70.0). Electronically Signed   By: Ryan Salvage M.D.   On: 02/15/2024 17:34   DG Chest Port 1 View Result Date: 02/15/2024 CLINICAL DATA:  Pain EXAM: PORTABLE CHEST 1 VIEW COMPARISON:  01/21/2024 FINDINGS: Cardiomegaly, vascular congestion. Interstitial prominence may reflect interstitial edema. No effusions or acute  bony abnormality. IMPRESSION: Continued interstitial edema pattern, not significantly changed since prior study. Electronically Signed   By: Franky Crease M.D.   On: 02/15/2024 13:44   US  Venous Img Lower Unilateral Left Result Date: 02/15/2024 CLINICAL DATA:  Left lower extremity edema EXAM: LEFT LOWER EXTREMITY VENOUS DOPPLER ULTRASOUND TECHNIQUE: Gray-scale sonography with compression, as well as color and duplex ultrasound, were performed to evaluate the deep venous system(s) from the level of the common femoral vein through the popliteal and proximal calf veins. COMPARISON:  None Available. FINDINGS: VENOUS Normal compressibility of the common femoral, superficial femoral, and popliteal veins, as well as the visualized calf veins. Visualized portions of profunda femoral vein and great saphenous vein unremarkable. No filling defects to suggest DVT on grayscale or color Doppler imaging. Doppler waveforms show normal direction of venous flow, normal respiratory plasticity and response to augmentation. Limited views of the contralateral common femoral vein are unremarkable. OTHER Superficial subcutaneous edema along the left calf. Limitations: none IMPRESSION: 1. No evidence of acute DVT. 2. Superficial subcutaneous edema may reflect volume overload or cellulitis. Electronically Signed   By: Wilkie Lent M.D.   On: 02/15/2024 13:32   CT Knee Right Wo Contrast Result Date: 01/29/2024 CLINICAL DATA:  Knee trauma EXAM: CT OF THE RIGHT KNEE WITHOUT CONTRAST TECHNIQUE: Multidetector CT imaging of the right knee was performed according to the standard protocol. Multiplanar CT image reconstructions were also generated. RADIATION DOSE REDUCTION: This exam was performed according to the departmental dose-optimization program which includes automated exposure control, adjustment of the mA and/or kV according to patient size and/or use of iterative reconstruction technique. COMPARISON:  01/29/2024 FINDINGS:  Bones/Joint/Cartilage No acute fracture or malalignment. Mild tricompartment arthritis of the knee with mild joint space narrowing and slight spurring. No sizable knee effusion. Ligaments Suboptimally assessed by CT. Muscles and Tendons No intramuscular fluid collections. Soft tissues Prominent soft tissue swelling anterior to the patella. Moderate circumferential edema extending from the distal thigh to the imaged lower leg. IMPRESSION: 1. No acute fracture or malalignment. 2. Prominent soft tissue swelling anterior to the patella. Moderate circumferential edema extending from the distal thigh to the imaged lower leg. 3. Mild tricompartment arthritis of the knee. Electronically Signed   By: Luke Bun M.D.   On: 01/29/2024 21:39   CT Cervical Spine Wo Contrast Result Date: 01/29/2024 CLINICAL DATA:  Trauma fall EXAM: CT CERVICAL SPINE WITHOUT CONTRAST TECHNIQUE: Multidetector CT imaging of the cervical spine was performed without intravenous contrast. Multiplanar CT image reconstructions were also generated. RADIATION DOSE REDUCTION: This exam was performed according to the departmental dose-optimization program which includes automated exposure control, adjustment of the mA and/or kV according to patient size and/or use of iterative reconstruction technique. COMPARISON:  CT 01/12/2024 FINDINGS: Alignment: Mild reversal of cervical lordosis. Facet alignment is within normal limits. Skull base and vertebrae: No acute fracture. No primary bone lesion or focal pathologic process. Soft tissues and spinal canal: No prevertebral fluid or swelling. No visible canal hematoma. Disc levels: Multilevel degenerative change. Mild to moderate diffuse disc space narrowing C4 through C7. Facet degenerative changes at multiple levels with foraminal narrowing. Upper chest: Negative. Other: None IMPRESSION: Mild reversal of cervical lordosis with degenerative changes. No acute osseous abnormality. Electronically Signed   By:  Luke Bun M.D.   On: 01/29/2024 21:23   CT Head Wo Contrast Result Date: 01/29/2024 CLINICAL DATA:  Head trauma fall EXAM: CT HEAD WITHOUT CONTRAST TECHNIQUE: Contiguous axial images were obtained from the base of the skull through the vertex without intravenous contrast. RADIATION DOSE REDUCTION: This exam was performed according to the departmental dose-optimization program which includes automated exposure control, adjustment of the mA and/or kV according to patient size and/or use of iterative reconstruction technique. COMPARISON:  CT brain 01/21/2024, 12/18/2023 FINDINGS: Brain: No acute territorial infarction, hemorrhage or intracranial mass. Mild atrophy. Mild patchy white matter hypodensity. Nonenlarged ventricles Vascular: No hyperdense vessels.  Carotid vascular calcification Skull: Normal. Negative for fracture or focal lesion. Sinuses/Orbits: No acute finding. Other: None IMPRESSION: 1. No CT evidence for acute intracranial abnormality. 2. Atrophy and mild chronic small vessel ischemic changes of the white matter. Electronically Signed   By: Luke Bun M.D.   On: 01/29/2024 21:02   DG Elbow Complete Right Result Date: 01/29/2024 CLINICAL DATA:  Fall, right elbow pain EXAM: RIGHT ELBOW - COMPLETE 3+ VIEW COMPARISON:  None Available. FINDINGS: There is no evidence of fracture, dislocation, or joint effusion. There is no evidence of arthropathy or other focal bone abnormality. Soft tissues are unremarkable. IMPRESSION: Negative. Electronically Signed   By: Dorethia Molt M.D.   On: 01/29/2024 20:23   DG Wrist Complete Right Result Date: 01/29/2024 CLINICAL DATA:  Fall, pain EXAM: RIGHT WRIST - COMPLETE 3+ VIEW COMPARISON:  None Available. FINDINGS: There is no evidence of fracture or dislocation. There is no evidence of arthropathy or other focal bone abnormality. There is soft tissue swelling dorsal to the radiocarpal articulation. IMPRESSION: 1. Soft tissue swelling. No fracture or  dislocation. Electronically Signed   By: Dorethia Molt M.D.   On: 01/29/2024 20:22   DG Knee Complete 4 Views  Right Result Date: 01/29/2024 CLINICAL DATA:  Right knee pain after fall today. EXAM: RIGHT KNEE - COMPLETE 4+ VIEW COMPARISON:  January 12, 2024. FINDINGS: No evidence of fracture, dislocation, or joint effusion. Mild narrowing of medial joint space is noted. Moderate prepatellar soft tissue swelling is noted suggesting traumatic injury. IMPRESSION: Moderate prepatellar soft tissue swelling is noted suggesting traumatic injury. No fracture or dislocation is noted. Electronically Signed   By: Lynwood Landy Raddle M.D.   On: 01/29/2024 16:43    Microbiology: Results for orders placed or performed during the hospital encounter of 02/15/24  C Difficile Quick Screen w PCR reflex     Status: None   Collection Time: 02/15/24  3:27 PM   Specimen: STOOL  Result Value Ref Range Status   C Diff antigen NEGATIVE NEGATIVE Final   C Diff toxin NEGATIVE NEGATIVE Final   C Diff interpretation No C. difficile detected.  Final    Comment: Performed at [facility], [address] [place], [place] [address]  Gastrointestinal Panel by PCR , Stool     Status: None   Collection Time: 02/15/24  3:28 PM   Specimen: STOOL  Result Value Ref Range Status   Campylobacter species NOT DETECTED NOT DETECTED Final   Plesimonas shigelloides NOT DETECTED NOT DETECTED Final   Salmonella species NOT DETECTED NOT DETECTED Final   Yersinia enterocolitica NOT DETECTED NOT DETECTED Final   Vibrio species NOT DETECTED NOT DETECTED Final   Vibrio cholerae NOT DETECTED NOT DETECTED Final   Enteroaggregative E coli (EAEC) NOT DETECTED NOT DETECTED Final   Enteropathogenic E coli (EPEC) NOT DETECTED NOT DETECTED Final   Enterotoxigenic E coli (ETEC) NOT DETECTED NOT DETECTED Final   Shiga like toxin producing E coli (STEC) NOT DETECTED NOT DETECTED Final   Shigella/Enteroinvasive E coli (EIEC) NOT DETECTED NOT DETECTED Final    Cryptosporidium NOT DETECTED NOT DETECTED Final   Cyclospora cayetanensis NOT DETECTED NOT DETECTED Final   Entamoeba histolytica NOT DETECTED NOT DETECTED Final   Giardia lamblia NOT DETECTED NOT DETECTED Final   Adenovirus F40/41 NOT DETECTED NOT DETECTED Final   Astrovirus NOT DETECTED NOT DETECTED Final   Norovirus GI/GII NOT DETECTED NOT DETECTED Final   Rotavirus A NOT DETECTED NOT DETECTED Final   Sapovirus (I, II, IV, and V) NOT DETECTED NOT DETECTED Final    Comment: Performed at [facility], [address] [place], [place] [address]  Urine Culture     Status: Abnormal   Collection Time: 02/20/24  1:22 PM   Specimen: Urine, Random  Result Value Ref Range Status   Specimen Description   Final    URINE, RANDOM Performed at [facility], [address], [place], [place] [address]    Special Requests   Final    NONE Reflexed from [id number] Performed at [facility], [address], [place], [place] [address]    Culture >=100,000 COLONIES/mL KLEBSIELLA PNEUMONIAE (A)  Final   Report Status 02/22/2024 FINAL  Final   Organism ID, Bacteria KLEBSIELLA PNEUMONIAE (A)  Final      Susceptibility   Klebsiella pneumoniae - MIC*    AMPICILLIN  RESISTANT Resistant     CEFAZOLIN  (URINE) Value in next row Sensitive      2 SENSITIVEThis is a modified FDA-approved test that has been validated and its performance characteristics determined by the reporting laboratory.  This laboratory is certified under the Clinical Laboratory Improvement Amendments CLIA as qualified to perform high complexity clinical laboratory testing.  CEFEPIME  Value in next row Sensitive      2 SENSITIVEThis is a modified FDA-approved test that has been validated and its performance characteristics determined by the reporting laboratory.  This laboratory is certified under the Clinical Laboratory Improvement Amendments CLIA as qualified to perform high complexity clinical laboratory testing.    ERTAPENEM Value in  next row Sensitive      2 SENSITIVEThis is a modified FDA-approved test that has been validated and its performance characteristics determined by the reporting laboratory.  This laboratory is certified under the Clinical Laboratory Improvement Amendments CLIA as qualified to perform high complexity clinical laboratory testing.    CEFTRIAXONE  Value in next row Sensitive      2 SENSITIVEThis is a modified FDA-approved test that has been validated and its performance characteristics determined by the reporting laboratory.  This laboratory is certified under the Clinical Laboratory Improvement Amendments CLIA as qualified to perform high complexity clinical laboratory testing.    CIPROFLOXACIN  Value in next row Sensitive      2 SENSITIVEThis is a modified FDA-approved test that has been validated and its performance characteristics determined by the reporting laboratory.  This laboratory is certified under the Clinical Laboratory Improvement Amendments CLIA as qualified to perform high complexity clinical laboratory testing.    GENTAMICIN Value in next row Sensitive      2 SENSITIVEThis is a modified FDA-approved test that has been validated and its performance characteristics determined by the reporting laboratory.  This laboratory is certified under the Clinical Laboratory Improvement Amendments CLIA as qualified to perform high complexity clinical laboratory testing.    NITROFURANTOIN  Value in next row Intermediate      2 SENSITIVEThis is a modified FDA-approved test that has been validated and its performance characteristics determined by the reporting laboratory.  This laboratory is certified under the Clinical Laboratory Improvement Amendments CLIA as qualified to perform high complexity clinical laboratory testing.    TRIMETH /SULFA  Value in next row Sensitive      2 SENSITIVEThis is a modified FDA-approved test that has been validated and its performance characteristics determined by the reporting  laboratory.  This laboratory is certified under the Clinical Laboratory Improvement Amendments CLIA as qualified to perform high complexity clinical laboratory testing.    AMPICILLIN /SULBACTAM Value in next row Sensitive      2 SENSITIVEThis is a modified FDA-approved test that has been validated and its performance characteristics determined by the reporting laboratory.  This laboratory is certified under the Clinical Laboratory Improvement Amendments CLIA as qualified to perform high complexity clinical laboratory testing.    PIP/TAZO Value in next row Sensitive      <=4 SENSITIVEThis is a modified FDA-approved test that has been validated and its performance characteristics determined by the reporting laboratory.  This laboratory is certified under the Clinical Laboratory Improvement Amendments CLIA as qualified to perform high complexity clinical laboratory testing.    MEROPENEM Value in next row Sensitive      <=4 SENSITIVEThis is a modified FDA-approved test that has been validated and its performance characteristics determined by the reporting laboratory.  This laboratory is certified under the Clinical Laboratory Improvement Amendments CLIA as qualified to perform high complexity clinical laboratory testing.    * >=100,000 COLONIES/mL KLEBSIELLA PNEUMONIAE   *Note: Due to a large number of results and/or encounters for the requested time period, some results have not been displayed. A complete set of results can be found in Results Review.    Labs: CBC: Recent Labs  Lab  02/20/24 0500 02/21/24 0356 02/22/24 0509 02/24/24 0412 02/25/24 0410  WBC 6.4  --  7.0  --  5.6  HGB 8.3* 8.2* 8.3* 7.9* 8.2*  HCT 27.0* 26.5* 27.0* 25.9* 26.9*  MCV 82.6  --  85.2  --  84.6  PLT 158  --  151  --  169   Basic Metabolic Panel: Recent Labs  Lab 02/20/24 0500 02/21/24 0356 02/22/24 0509 02/24/24 0412 02/25/24 0410  NA 135 136 131* 135 133*  K 2.8* 3.4* 4.1 3.6 3.5  CL 98 97* 98 97* 98  CO2  26 29 28 27 27   GLUCOSE 126* 155* 172* 216* 200*  BUN 9 9 10 10  7*  CREATININE 0.63 0.56 0.56 0.55 0.51  CALCIUM  7.6* 8.1* 7.4* 8.0* 8.3*  MG 1.0*  --   --   --   --   PHOS 3.2 2.6  --   --   --    Liver Function Tests: Recent Labs  Lab 02/20/24 0500 02/21/24 0356  ALBUMIN  2.5* 3.4*   CBG: Recent Labs  Lab 02/24/24 0712 02/24/24 1104 02/24/24 1622 02/25/24 0708 02/25/24 1126  GLUCAP 216* 243* 229* 217* 349*    Discharge time spent:  35 minutes.  Signed: Eric Nunnery, MD [facility] 02/25/2024

## 2024-02-25 NOTE — Inpatient Diabetes Management (Signed)
 Inpatient Diabetes Program Recommendations  AACE/ADA: New Consensus Statement on Inpatient Glycemic Control (2015)  Target Ranges:  Prepandial:   less than 140 mg/dL      Peak postprandial:   less than 180 mg/dL (1-2 hours)      Critically ill patients:  140 - 180 mg/dL   Lab Results  Component Value Date   GLUCAP 217 (H) 02/25/2024   HGBA1C 4.9 02/16/2024    Review of Glycemic Control  Latest Reference Range & Units 02/24/24 07:12 02/24/24 11:04 02/24/24 16:22 02/25/24 07:08  Glucose-Capillary 70 - 99 mg/dL 783 (H) 756 (H) 770 (H) 217 (H)   Diabetes history: DM 2 Outpatient Diabetes medications:  Mounjaro  12. 5 mg q Sunday, Metformin  1000 mg daily, Humalog  0-10 units tid with meals, Lantus  10 units daily Current orders for Inpatient glycemic control:  Novolog  0-9 units tid with meals and HS  Inpatient Diabetes Program Recommendations:    -   Consider adding Lantus  10 units daily (this is home dose).   Thanks,  Clotilda Bull RN, MSN, BC-ADM Inpatient Diabetes Coordinator Team Pager [phone] (8a-5p)

## 2024-02-26 ENCOUNTER — Telehealth: Payer: Self-pay | Admitting: *Deleted

## 2024-02-26 NOTE — Transitions of Care (Post Inpatient/ED Visit) (Addendum)
 02/26/2024  Name: Amber Stephenson MRN: [id number] DOB: May 18, 1946  Today's TOC FU Call Status: Today's TOC FU Call Status:: Successful TOC FU Call Completed TOC FU Call Complete Date: 02/26/24 Patient's Name and Date of Birth confirmed.  Transition Care Management Follow-up Telephone Call Date of Discharge: 02/25/24 [facility] Type of Discharge: Inpatient Admission Primary Inpatient Discharge Diagnosis:: Acute on chronic diastolic heart failure How have you been since you were released from the hospital?:  (appetite is per pt  fair, has been this way for years, ambulating without difficulty, having regular bowel movements) Any questions or concerns?: No  Items Reviewed: Did you receive and understand the discharge instructions provided?: Yes Medications obtained,verified, and reconciled?: Yes (Medications Reviewed) Any new allergies since your discharge?: No Dietary orders reviewed?: Yes Type of Diet Ordered:: heart healthy, low sodium,  carbohydrate modified Do you have support at home?: Yes People in Home [RPT]: spouse Name of Support/Comfort Primary Source: Vaughan Gambler  Medications Reviewed Today: Medications Reviewed Today     Reviewed by Aura Mliss LABOR, RN (Registered Nurse) on 02/26/24 at 1111  Med List Status: <None>   Medication Order Taking? Sig Documenting Provider Last Dose Status Informant  acetaminophen  (TYLENOL ) 325 MG tablet [id number] Yes Take 2 tablets (650 mg total) by mouth every 6 (six) hours as needed for mild pain (pain score 1-3) or fever (or Fever >/= 101). Pearlean Manus, MD  Active Spouse/Significant Other, Pharmacy Records  ALPRAZolam  (XANAX ) 1 MG tablet [id number] Yes Take 1 mg by mouth at bedtime as needed for sleep. [provider]  Active Spouse/Significant Other, Pharmacy Records  ARIPiprazole  (ABILIFY ) 2 MG tablet [id number] Yes Take 1 tablet (2 mg total) by mouth at bedtime. Pearlean Manus, MD  Active  Spouse/Significant Other, Pharmacy Records  atorvastatin  (LIPITOR) 80 MG tablet [id number] Yes Take 1 tablet (80 mg total) by mouth daily. Pearlean Manus, MD  Active Spouse/Significant Other, Pharmacy Records  bupivacaine -epinephrine  (MARCAINE  W/ EPI) 0.5% -1:200000 injection [id number]   Hollis, Kevin D, MD  Active   carbidopa -levodopa  (SINEMET ) 10-100 MG tablet [id number] Yes Take 1 tablet by mouth 4 (four) times daily. For restless leg syndrome Pearlean Manus, MD  Active Spouse/Significant Other, Pharmacy Records  cefadroxil  (DURICEF) 500 MG capsule [id number] Yes Take 1 capsule (500 mg total) by mouth 2 (two) times daily for 4 days. Ricky Fines, MD  Active   colchicine  0.6 MG tablet [id number] Yes Take 1 tablet (0.6 mg total) by mouth every 12 (twelve) hours as needed (gout flare.). For mouth sores Ricky Fines, MD  Active   Continuous Glucose Sensor (DEXCOM G7 SENSOR) [place] [id number] Yes CHANGE SENSOR EVERY 10 DAYS Shamleffer, Donell Cardinal, MD  Active Spouse/Significant Other, Pharmacy Records  cyanocobalamin  (VITAMIN B12) 1000 MCG tablet [id number] Yes Take 1 tablet (1,000 mcg total) by mouth daily. Raenelle Donalda HERO, MD  Active Spouse/Significant Other, Pharmacy Records  denosumab  (PROLIA ) injection 60 mg [id number]   Stacks, Warren, MD  Active   diphenoxylate -atropine  (LOMOTIL ) 2.5-0.025 MG tablet [id number] Yes TAKE 2 TABLETS BY MOUTH FOUR TIMES DAILY AS NEEDED FOR DIARRHEA OR LOOSE LEELAND [name], MD  Active Spouse/Significant Other, Pharmacy Records  ELIQUIS  5 MG TABS tablet [id number] Yes TAKE 1 TABLET TWICE A DAY Turner, Wilbert SAUNDERS, MD  Active Spouse/Significant Other, Pharmacy Records  escitalopram  (LEXAPRO ) 10 MG tablet [id number] Yes Take 10 mg by mouth daily. [provider]  Active Spouse/Significant Other, Pharmacy Records           Med  Note (WARD, ANGELICA G   Tue Jan 12, 2024  2:28 PM)    ferrous sulfate  325 (65 FE) MG EC tablet [id number] Yes Take 1 tablet (325 mg  total) by mouth 2 (two) times daily. Ricky Fines, MD  Active   furosemide  (LASIX ) 40 MG tablet [id number] Yes Take 40 mg by mouth in the morning and 20 mg in the evening. Ricky Fines, MD  Active   gabapentin  (NEURONTIN ) 100 MG capsule [id number] Yes Take 1 capsule (100 mg total) by mouth 3 (three) times daily. Pearlean Manus, MD  Active Spouse/Significant Other, Pharmacy Records  hydrocortisone  (ANUSOL -HC) 2.5 % rectal cream [id number] Yes Place rectally 2 (two) times daily. Ricky Fines, MD  Active   hydrOXYzine  (VISTARIL ) 25 MG capsule [id number] Yes Take 1 capsule (25 mg total) by mouth every 8 (eight) hours as needed for anxiety. Pearlean Manus, MD  Active Spouse/Significant Other, Pharmacy Records  insulin  glargine (LANTUS  SOLOSTAR) 100 UNIT/ML Solostar Pen [id number] Yes Inject 10 Units into the skin at bedtime. Pearlean Manus, MD  Active Spouse/Significant Other, Pharmacy Records  insulin  lispro (HUMALOG  KWIKPEN) 100 UNIT/ML KwikPen [id number] Yes Inject 0-10 Units into the skin 2 (two) times daily before a meal. Sliding scale Short acting Humalog  insulin  per sliding scale 0-10 ---:-- Insulin  injection 0-10 Units 0-10 Units Subcutaneous, 3 times daily with meals CBG 70 - 120: 0 unit CBG 121 - 150: 0 unit  CBG 151 - 200: 1 unit CBG 201 - 250: 2 units CBG 251 - 300: 4 units CBG 301 - 350: 6 units  CBG 351 - 400: 8 units  CBG > 400: 10 units Emokpae, Courage, MD  Active Spouse/Significant Other, Pharmacy Records  Insulin  Pen Needle 29G X MISC [id number] Yes 1 Device by Does not apply route daily in the afternoon. Shamleffer, Donell Cardinal, MD  Active Spouse/Significant Other, Pharmacy Records  lidocaine  (LIDODERM ) 5 % [id number] Yes Place 1 patch onto the skin daily. Remove & Discard patch within 12 hours or as directed by MD Ricky Fines, MD  Active   losartan  (COZAAR ) 25 MG tablet [id number] Yes Take 25 mg by mouth daily. [provider]  Active Spouse/Significant Other,  Pharmacy Records  MAGNESIUM  PO [id number] Yes Take 1 tablet by mouth daily. [provider]  Active Spouse/Significant Other, Pharmacy Records  Methocarbamol  1000 MG TABS [id number] Yes Take 1,000 mg by mouth every 6 (six) hours as needed. Lovorn, Megan, MD  Active Spouse/Significant Other, Pharmacy Records  metoprolol  tartrate (LOPRESSOR ) 50 MG tablet [id number] Yes Take 1 tablet (50 mg total) by mouth 2 (two) times daily. Pearlean Manus, MD  Active Spouse/Significant Other, Pharmacy Records  Misc. Devices (3-IN-1 BEDSIDE TOILET) MISC [id number] Yes Length of need 99 months M17.11 Osteoarthritis right knee / multiple falls / high risk fall Margrette Taft BRAVO, MD  Active Spouse/Significant Other, Pharmacy Records  ondansetron  (ZOFRAN ) 4 MG tablet [id number] Yes Take 1 tablet (4 mg total) by mouth every 8 (eight) hours as needed for nausea or vomiting. For cancer related nausea Dettinger, Fonda LABOR, MD  Active Spouse/Significant Other, Pharmacy Records  OXcarbazepine  (TRILEPTAL ) 150 MG tablet [id number] Yes Take 1 tablet (150 mg total) by mouth 2 (two) times daily. Sheikh, Omair [place], DO  Active Spouse/Significant Other, Pharmacy Records  oxyCODONE  (OXYCONTIN ) 20 mg 12 hr tablet [id number] Yes Take 1 tablet (20 mg total) by mouth every 12 (twelve) hours. G89.3- chronic pain- fill 28 days after 8/25- Rx Lovorn, Megan, MD  Active   pantoprazole  (PROTONIX ) 40 MG  tablet [id number] Yes Take 1 tablet (40 mg total) by mouth 2 (two) times daily. Pearlean Manus, MD  Active Spouse/Significant Other, Pharmacy Records  potassium chloride  (KLOR-CON ) 10 MEQ tablet [id number] Yes Take 1 tablet (10 mEq total) by mouth daily. Shlomo Wilbert SAUNDERS, MD  Active Spouse/Significant Other, Pharmacy Records  rOPINIRole  (REQUIP ) 2 MG tablet [id number] Yes Take 1 tablet (2 mg total) by mouth at bedtime. [name], MD  Active Spouse/Significant Other, Pharmacy Records  tirzepatide  (MOUNJARO ) 12.5 MG/0.5ML Pen [id number] Yes  Inject 12.5 mg into the skin once a week. Shamleffer, Ibtehal Jaralla, MD  Active Spouse/Significant Other, Pharmacy Records  vitamin C  (VITAMIN C ) 500 MG tablet [id number] Yes Take 0.5 tablets (250 mg total) by mouth 2 (two) times daily. Ricky Fines, MD  Active   Vitamin D , Cholecalciferol , 25 MCG (1000 UT) TABS [id number] Yes Take 1,000 Units by mouth in the morning. [provider]  Active Spouse/Significant Other, Pharmacy Records  Med List Note (Ward, Angelica, CPhT 01/12/24 1617): [employer] insurance, pt's spouse assists with meds            Home Care and Equipment/Supplies: Were Home Health Services Ordered?: Yes Name of Home Health Agency:: [facility] Has Agency set up a time to come to your home?: Yes First Home Health Visit Date:  (pt reports [facility] called, she is not sure of exact date, it is written down, pt does not have in front of her) Any new equipment or medical supplies ordered?: No  Functional Questionnaire: Do you need assistance with bathing/showering or dressing?: No Do you need assistance with meal preparation?: Yes (spouse assists) Do you need assistance with eating?: No Do you have difficulty maintaining continence: No Do you need assistance with getting out of bed/getting out of a chair/moving?: No Do you have difficulty managing or taking your medications?: No (spouse assists)  Follow up appointments reviewed: PCP Follow-up appointment confirmed?: Yes Date of PCP follow-up appointment?: 03/10/24 Follow-up Provider: Dr. [facility]ie [facility] Follow-up appointment confirmed?:  (GI) Reason Specialist Follow-Up Not Confirmed:  (pt to see cardiologist 03/02/24) Do you need transportation to your follow-up appointment?: No Do you understand care options if your condition(s) worsen?: Yes-patient verbalized understanding   Goals Addressed             This Visit's Progress    [facility] Transitions of Care (TOC) Care Plan       Problems:  Recent  Hospitalization for treatment of CHF No Specialist appointment pt reports she has contact # for GI, will call to schedule Pt lives with spouse, pt is working with [facility] Pt reports chronic GI bleeding due to 2 bleeding ulcers, is currently being treated for UTI Pt reports FBS high this morning with this infection I have  FBS near 300  Goal:  Over the next 30 days, the patient will not experience hospital readmission  Interventions:  Heart Failure Interventions: Basic overview and discussion of pathophysiology of Heart Failure reviewed Provided education on low sodium diet Assessed need for readable accurate scales in home Provided education about placing scale on hard, flat surface Advised patient to weigh each morning after emptying bladder Discussed importance of daily weight and advised patient to weigh and record daily Reviewed role of diuretics in prevention of fluid overload and management of heart failure; Discussed the importance of keeping all appointments with provider Screening for signs and symptoms of depression related to chronic disease state  Assessed social determinant of health barriers  Reviewed signs/ symptoms UTI,  importance of taking antibiotic as prescribed Reviewed importance of working with home health, completing prescribed exercises Reviewed signs /symptoms GI bleeding, action plan Reviewed all upcoming scheduled appointments Encouraged small frequent meals with adequate protein Reviewed carbohydrate modified diet Reviewed correlation of elevated CBG due to infection Message sent to longitudinal case manager with update and cancellation of today's scheduled appointment due to pt enrolled in TOC 30 day program Today's note faxed to primary care provider  Patient Self Care Activities:  Attend all scheduled provider appointments Attend church or other social activities Call pharmacy for medication refills 3-7 days in advance of running out of  medications Call provider office for new concerns or questions  Notify RN Care Manager of TOC call rescheduling needs Participate in Transition of Care Program/Attend TOC scheduled calls Take medications as prescribed   call office if I gain more than 2 pounds in one day or 5 pounds in one week keep legs up while sitting watch for swelling in feet, ankles and legs every day weigh myself daily develop a rescue plan follow rescue plan if symptoms flare-up eat more whole grains, fruits and vegetables, lean meats and healthy fats track symptoms and what helps feel better or worse dress right for the weather, hot or cold Eat small, frequent meals, include adequate protein at each meal Please follow carbohydrate modified diet Take antibiotic as prescribed for UTI, report any ongoing symptoms to your doctor Follow up with GI  Plan:  Telephone follow up appointment with care management team member scheduled for:  03/04/24 @ 1045 am The patient has been provided with contact information for the care management team and has been advised to call with any health related questions or concerns.          Mliss Creed [facility], BSN RN Care Manager/ Transition of Care [place] [phone]

## 2024-02-29 ENCOUNTER — Other Ambulatory Visit: Payer: Self-pay | Admitting: Gastroenterology

## 2024-02-29 ENCOUNTER — Ambulatory Visit: Admitting: Psychiatry

## 2024-02-29 DIAGNOSIS — K921 Melena: Secondary | ICD-10-CM

## 2024-02-29 NOTE — Progress Notes (Signed)
 HEART IMPACT TRANSITIONS OF CARE    PCP: [name], MD  Primary Cardiologist: Wilbert Bihari, MD   Chief Complaint: chronic HFpEF  HPI: 78 y/o female w/ h/o HFpEF, HTN, permanent AF on Eliquis , Type 2DM, nonobstructive CAD by cath in 2019, CKD lll, OSA in tolerant to CPAP, HLD, asthma and bipolar disorder.    She was admitted to [facility] 1/24 for acute respiratory failure with hypoxia in the setting of acute on chronic diastolic CHF exacerbation, required supp O2. Diuresed w/ IV Lasix . 2D echo showed hyperdynamic LVEF, 65-70%, RV normal, several LAE, no MR. After diuresis, was transitioned to PO Lasix . D/c wt reported at 194 lb. Was discharged home w/ PRN supp O2 and referred to pulmonology as well, has pending appt w/ Dr. Alaine. Of note, hospital instructions were for patient to take Lasix  40 mg bid. She reports she had only been taking 20 mg bid.   Cardiac PET 4/25: Rest EF 50%, stress EF 51%.  No evidence of infarction/ischemia.  Mild coronary calcifications present in LAD and left circumflex  Admitted 9/25 with A/C diastolic HF, AKI, Klebsiella UTI and ABLA.  Hgb 8 on admission.  GI was consulted. Colonoscopy and EGD showed AVMs s/p Argon therapy.  She diuresed with IV Lasix  and albumin .  Echo showed EF 50-55%, RV moderately reduced, LA/RA severely dilated, mild MR, severe mitral annular calcification.  SCr 0.51 at discharge.  Eliquis  restarted at discharge.  Today she returns for AHF TOC follow up with her husband. Had another fall yesterday with RLS. Overall feeling ok just tired. Denies palpitations, CP, or PND/Orthopnea. Sleeps on multiple pillows. Reports swelling in BLE. Dizziness with quick positional changes. SOB with any exertion. Appetite improving. No fever or chills. Weight at home ~163 pounds. Taking all medications, her husband sets up a pill box for them. Failed SGLT2i with recurrent UTIs. Plan on arranging PT schedule today. Tries to drink x2 34 oz cups of water /day. SBP  at home >100.    ROS: All systems negative except as listed in HPI, PMH and Problem List.  SH:  Social History   Socioeconomic History   Marital status: Married    Spouse name: Dwight   Number of children: 3   Years of education: 15   Highest education level: Associate degree: academic program  Occupational History   Occupation: Retired    Comment: Chief Financial Officer  Tobacco Use   Smoking status: Never   Smokeless tobacco: Never  Vaping Use   Vaping status: Never Used  Substance and Sexual Activity   Alcohol  use: Not Currently    Alcohol /week: 0.0 standard drinks of alcohol    Drug use: No   Sexual activity: Not Currently    Partners: Male    Birth control/protection: Post-menopausal  Other Topics Concern   Not on file  Social History Narrative   Lives at home with husband.    They have 3 children who live away - [place] , [place] , and [place] .   She is from [place]  and most of her family lives there.   Her husbands's family live nearby   Right handed.   Social Drivers of Corporate investment banker Strain: Low Risk  (08/13/2023)   Overall Financial Resource Strain (CARDIA)    Difficulty of Paying Living Expenses: Not hard at all  Food Insecurity: No Food Insecurity (02/15/2024)   Hunger Vital Sign    Worried About Running Out of Food in the Last Year: Never true    Ran Out of  Food in the Last Year: Never true  Transportation Needs: No Transportation Needs (02/15/2024)   PRAPARE - Administrator, Civil Service (Medical): No    Lack of Transportation (Non-Medical): No  Physical Activity: Insufficiently Active (08/13/2023)   Exercise Vital Sign    Days of Exercise per Week: 3 days    Minutes of Exercise per Session: 20 min  Stress: No Stress Concern Present (08/13/2023)   [employer] of Occupational Health - Occupational Stress Questionnaire    Feeling of Stress : Only a little  Social Connections: Socially Integrated (02/15/2024)   Social Connection and  Isolation Panel    Frequency of Communication with Friends and Family: More than three times a week    Frequency of Social Gatherings with Friends and Family: Once a week    Attends Religious Services: More than 4 times per year    Active Member of [employer] or Organizations: Yes    Attends Engineer, structural: More than 4 times per year    Marital Status: Married  Catering manager Violence: Not At Risk (02/15/2024)   Humiliation, Afraid, Rape, and Kick questionnaire    Fear of Current or Ex-Partner: No    Emotionally Abused: No    Physically Abused: No    Sexually Abused: No    FH:  Family History  Problem Relation Age of Onset   Diabetes Mother    Heart disease Mother    Alzheimer's disease Father    Heart disease Sister        CABG   Diabetes Sister    Alcohol  abuse Sister    Stroke Brother    Heart disease Brother    Mental illness Brother    Diabetes Brother    Breast cancer Neg Hx    Ovarian cancer Neg Hx    Colon cancer Neg Hx    Endometrial cancer Neg Hx     Past Medical History:  Diagnosis Date   Allergic rhinitis 02/24/2019   Ambulatory dysfunction 04/23/2020   Atrial fibrillation with RVR (HCC) 05/19/2017   Back pain/sacroiliitis--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   Benign paroxysmal positional vertigo due to bilateral vestibular disorder 05/20/2019   Bipolar 1 disorder (HCC) 01/23/2015   with GAD, benzo dependence   CAD (coronary artery disease)    Nonobstructive; Managed by Dr. Charls   Cardiomegaly 01/12/2018   CHF (congestive heart failure) 06/08/2022   Chronic back pain 01/04/2015   Chronic constipation 04/25/2020   Chronic diastolic heart failure (HCC) 05/30/2015   Chronic pain syndrome 08/22/2019   back pain, sacroiliitis   Chronic post-traumatic stress disorder (PTSD) 12/06/2020   Diabetic neuropathy (HCC) 02/06/2016   Dyslipidemia  09/24/2020   Essential hypertension    Functional diarrhea 10/26/2020   Herpes genitalis in women 07/16/2015   History of adenomatous polyp of colon 05/21/2019   Overview:   03/31/17: Colonoscopy: nonadvanced adenoma, microscopic colitis, f/u 5 yrs, [facility]/GAP   History of radiation therapy    Endometrial- 02/18/23-03/31/23-Dr. Lynwood Kinard   Hypotension 09/01/2022   Insomnia 01/23/2015   Metabolic acidosis 01/12/2023   Migraine headache with aura 02/12/2016   Myofascial pain dysfunction syndrome 08/22/2019   Non-alcoholic fatty liver disease 01/12/2018   OSA (obstructive sleep apnea) 02/24/2019   10/09/2018 - HST  - AHI 40.6    Osteopenia 12/06/2020   Rx alendronate  35 mg.   Pinguecula 12/06/2020  Presbyopia 12/06/2020   Pulmonary hypertension    RLS (restless legs syndrome) 04/27/2015   RSV (respiratory syncytial virus infection) 06/10/2023   Tremor, essential 12/11/2021   Type II diabetes mellitus (HCC) 09/24/2020    Current Outpatient Medications  Medication Sig Dispense Refill   acetaminophen  (TYLENOL ) 325 MG tablet Take 2 tablets (650 mg total) by mouth every 6 (six) hours as needed for mild pain (pain score 1-3) or fever (or Fever >/= 101).     ALPRAZolam  (XANAX ) 1 MG tablet Take 1 mg by mouth at bedtime as needed for sleep.     ARIPiprazole  (ABILIFY ) 2 MG tablet Take 1 tablet (2 mg total) by mouth at bedtime. 30 tablet 5   atorvastatin  (LIPITOR) 80 MG tablet Take 1 tablet (80 mg total) by mouth daily. 90 tablet 3   carbidopa -levodopa  (SINEMET ) 10-100 MG tablet Take 1 tablet by mouth 4 (four) times daily. For restless leg syndrome 120 tablet 5   colchicine  0.6 MG tablet Take 1 tablet (0.6 mg total) by mouth every 12 (twelve) hours as needed (gout flare.). For mouth sores     Continuous Glucose Sensor (DEXCOM G7 SENSOR) MISC CHANGE SENSOR EVERY 10 DAYS 9 each 3   cyanocobalamin  (VITAMIN B12) 1000 MCG tablet Take 1 tablet (1,000 mcg total) by mouth daily. 30 tablet 1    ELIQUIS  5 MG TABS tablet TAKE 1 TABLET TWICE A DAY 180 tablet 3   escitalopram  (LEXAPRO ) 10 MG tablet Take 10 mg by mouth daily.     ferrous sulfate  325 (65 FE) MG EC tablet Take 1 tablet (325 mg total) by mouth 2 (two) times daily. 60 tablet 3   gabapentin  (NEURONTIN ) 100 MG capsule Take 1 capsule (100 mg total) by mouth 3 (three) times daily. 90 capsule 3   hydrocortisone  (ANUSOL -HC) 2.5 % rectal cream Place rectally 2 (two) times daily. 30 g 0   hydrOXYzine  (VISTARIL ) 25 MG capsule Take 1 capsule (25 mg total) by mouth every 8 (eight) hours as needed for anxiety. 30 capsule 5   insulin  glargine (LANTUS  SOLOSTAR) 100 UNIT/ML Solostar Pen Inject 10 Units into the skin at bedtime. 9 mL 0   insulin  lispro (HUMALOG  KWIKPEN) 100 UNIT/ML KwikPen Inject 0-10 Units into the skin 2 (two) times daily before a meal. Sliding scale Short acting Humalog  insulin  per sliding scale 0-10 ---:-- Insulin  injection 0-10 Units 0-10 Units Subcutaneous, 3 times daily with meals CBG 70 - 120: 0 unit CBG 121 - 150: 0 unit  CBG 151 - 200: 1 unit CBG 201 - 250: 2 units CBG 251 - 300: 4 units CBG 301 - 350: 6 units  CBG 351 - 400: 8 units  CBG > 400: 10 units 18 mL 1   Insulin  Pen Needle 29G X MISC 1 Device by Does not apply route daily in the afternoon. 400 each 3   lidocaine  (LIDODERM ) 5 % Place 1 patch onto the skin daily. Remove & Discard patch within 12 hours or as directed by MD 30 patch 0   losartan  (COZAAR ) 25 MG tablet Take 25 mg by mouth daily.     MAGNESIUM  PO Take 1 tablet by mouth daily.     Methocarbamol  1000 MG TABS Take 1,000 mg by mouth every 6 (six) hours as needed. 120 tablet 5   metoprolol  tartrate (LOPRESSOR ) 50 MG tablet Take 1 tablet (50 mg total) by mouth 2 (two) times daily. 180 tablet 3   Misc. Devices (3-IN-1 BEDSIDE TOILET) MISC Length of need  99 months M17.11 Osteoarthritis right knee / multiple falls / high risk fall 1 each 1   ondansetron  (ZOFRAN ) 4 MG tablet Take 1 tablet (4 mg total) by  mouth every 8 (eight) hours as needed for nausea or vomiting. For cancer related nausea 30 tablet 1   OXcarbazepine  (TRILEPTAL ) 150 MG tablet Take 1 tablet (150 mg total) by mouth 2 (two) times daily. 60 tablet 0   oxyCODONE  (OXYCONTIN ) 20 mg 12 hr tablet Take 1 tablet (20 mg total) by mouth every 12 (twelve) hours. G89.3- chronic pain- fill 28 days after 8/25- Rx 60 tablet 0   pantoprazole  (PROTONIX ) 40 MG tablet Take 1 tablet (40 mg total) by mouth 2 (two) times daily. 60 tablet 5   potassium chloride  (KLOR-CON ) 10 MEQ tablet Take 1 tablet (10 mEq total) by mouth daily. 90 tablet 1   rOPINIRole  (REQUIP ) 2 MG tablet Take 1 tablet (2 mg total) by mouth at bedtime. 90 tablet 0   spironolactone  (ALDACTONE ) 25 MG tablet Take 0.5 tablets (12.5 mg total) by mouth daily. 45 tablet 3   tirzepatide  (MOUNJARO ) 12.5 MG/0.5ML Pen Inject 12.5 mg into the skin once a week. 6 mL 3   vitamin C  (VITAMIN C ) 500 MG tablet Take 0.5 tablets (250 mg total) by mouth 2 (two) times daily. 60 tablet 2   Vitamin D , Cholecalciferol , 25 MCG (1000 UT) TABS Take 1,000 Units by mouth in the morning.     diphenoxylate -atropine  (LOMOTIL ) 2.5-0.025 MG tablet TAKE 2 TABLETS BY MOUTH FOUR TIMES DAILY AS NEEDED FOR DIARRHEA OR LOOSE STOOLS (Patient not taking: Reported on 03/02/2024) 30 tablet 5   furosemide  (LASIX ) 40 MG tablet Take 1 tablet (40 mg total) by mouth 2 (two) times daily. 180 tablet 3   Current Facility-Administered Medications  Medication Dose Route Frequency Provider Last Rate Last Admin   denosumab  (PROLIA ) injection 60 mg  60 mg Subcutaneous Q6 months [name], MD   60 mg at 01/15/24 1459   Facility-Administered Medications Ordered in Other Encounters  Medication Dose Route Frequency Provider Last Rate Last Admin   bupivacaine -epinephrine  (MARCAINE  W/ EPI) 0.5% -1:200000 injection    Anesthesia Intra-op Tilford Franky BIRCH, MD   12 mL at 01/21/19 0935    Vitals:   03/02/24 1003  BP: 121/79  Pulse: 75  SpO2:  95%  Weight: 78 kg (172 lb)    PHYSICAL EXAM: General:  elderly/chronically ill appearing.  No respiratory difficulty. Arrived in [facility] Neck: JVD ~12 cm.  Cor: Regular rate & rhythm. No murmurs. Lungs: diminished, some crackles at bases Extremities: +1-2 BLE edema. +2 edema to R thigh. Very sensitive BLE.   Neuro: alert & oriented x 3. Affect pleasant.   Wt Readings from Last 3 Encounters:  03/02/24 78 kg (172 lb)  02/26/24 76.2 kg (168 lb)  02/25/24 76.3 kg (168 lb 3.4 oz)    ECG: none today   ASSESSMENT & PLAN: 1. HFpEF - Echo 1/24 EF 65-70%, RV normal. Severe LAE, no MR -Cardiac PET 4/25: Rest EF 50%, stress EF 51%.  No evidence of infarction/ischemia.  Mild coronary calcifications present in LAD and left circumflex - Echo 9/55 EF 50-55%, RV moderately reduced, LA/RA severely dilated, mild MR, severe mitral annular calcification. - NYHA Class IIIa, confounded by obesity and deconditioning, also ? some potential degree of PH given untreated OSA and suspected underlying pulmonary disease.  - Volume overloaded on exam.  Increase Lasix  40 AM/20 p.m> 40 mg BID - Start spiro 12.5 mg  daily. BMET/BNP today. Repeat BMET in 1 week.  - if continues w/ ongoing SOB despite diuretics, would consider RHC  - previously failed SGLT2i given UTIs (stopped Jardiance , recent Klebsiella UTI during last admission) - EF out of range for Entresto  - Continue metoprolol  tartrate 50 mg twice daily - Continue losartan  25 mg daily - Discussed importance of continued daily wts, sodium and fluid restriction    2. HTN - controlled on current regimen - BMP today    3. Mild Nonobstructive CAD - cath 2019, no CP  - See PET above - no ASA w/ Eliquis  use - continue statin + ? blocker   4. PAF - chronic, rate controlled. On Eliquis     5. Type 2DM - per PCP    6. OSA - refuses CPAP  - Plan for repeat sleep study per pulmonology  Follow up in TOC in ~2 weeks to reevaluate fluid status.   Follow up  with Dr. Shlomo. On recall list for 2/26, will see if she can get in sooner.

## 2024-03-01 ENCOUNTER — Telehealth (HOSPITAL_COMMUNITY): Payer: Self-pay

## 2024-03-01 NOTE — Telephone Encounter (Addendum)
 Called to confirm/remind patient of their appointment [facility] on 03/02/24.   Appointment:   [] Confirmed  [x] Left mess   [] No answer/No voice mail  [] VM Full/unable to leave message  [] Phone not in service  Patient reminded to bring all medications and/or complete list.  Confirmed patient has transportation. Gave directions, instructed to utilize valet parking.

## 2024-03-02 ENCOUNTER — Encounter (HOSPITAL_COMMUNITY): Payer: Self-pay

## 2024-03-02 ENCOUNTER — Ambulatory Visit (HOSPITAL_COMMUNITY)
Admission: RE | Admit: 2024-03-02 | Discharge: 2024-03-02 | Disposition: A | Discharge status: Home / Self Care | Source: Ambulatory Visit | Attending: Internal Medicine | Admitting: Internal Medicine

## 2024-03-02 ENCOUNTER — Ambulatory Visit (HOSPITAL_COMMUNITY): Payer: Self-pay | Admitting: Internal Medicine

## 2024-03-02 VITALS — BP 121/79 | HR 75 | Wt 172.0 lb

## 2024-03-02 DIAGNOSIS — W19XXXA Unspecified fall, initial encounter: Secondary | ICD-10-CM | POA: Insufficient documentation

## 2024-03-02 DIAGNOSIS — Z833 Family history of diabetes mellitus: Secondary | ICD-10-CM | POA: Insufficient documentation

## 2024-03-02 DIAGNOSIS — J45909 Unspecified asthma, uncomplicated: Secondary | ICD-10-CM | POA: Insufficient documentation

## 2024-03-02 DIAGNOSIS — I2510 Atherosclerotic heart disease of native coronary artery without angina pectoris: Secondary | ICD-10-CM | POA: Diagnosis not present

## 2024-03-02 DIAGNOSIS — Z794 Long term (current) use of insulin: Secondary | ICD-10-CM | POA: Insufficient documentation

## 2024-03-02 DIAGNOSIS — I480 Paroxysmal atrial fibrillation: Secondary | ICD-10-CM

## 2024-03-02 DIAGNOSIS — E119 Type 2 diabetes mellitus without complications: Secondary | ICD-10-CM

## 2024-03-02 DIAGNOSIS — Z7985 Long-term (current) use of injectable non-insulin antidiabetic drugs: Secondary | ICD-10-CM | POA: Diagnosis not present

## 2024-03-02 DIAGNOSIS — Z79899 Other long term (current) drug therapy: Secondary | ICD-10-CM | POA: Diagnosis not present

## 2024-03-02 DIAGNOSIS — Z8249 Family history of ischemic heart disease and other diseases of the circulatory system: Secondary | ICD-10-CM | POA: Insufficient documentation

## 2024-03-02 DIAGNOSIS — Z87440 Personal history of urinary (tract) infections: Secondary | ICD-10-CM | POA: Insufficient documentation

## 2024-03-02 DIAGNOSIS — F319 Bipolar disorder, unspecified: Secondary | ICD-10-CM | POA: Diagnosis not present

## 2024-03-02 DIAGNOSIS — N183 Chronic kidney disease, stage 3 unspecified: Secondary | ICD-10-CM | POA: Insufficient documentation

## 2024-03-02 DIAGNOSIS — I5032 Chronic diastolic (congestive) heart failure: Secondary | ICD-10-CM

## 2024-03-02 DIAGNOSIS — Z7901 Long term (current) use of anticoagulants: Secondary | ICD-10-CM | POA: Insufficient documentation

## 2024-03-02 DIAGNOSIS — E1122 Type 2 diabetes mellitus with diabetic chronic kidney disease: Secondary | ICD-10-CM | POA: Insufficient documentation

## 2024-03-02 DIAGNOSIS — I10 Essential (primary) hypertension: Secondary | ICD-10-CM | POA: Diagnosis not present

## 2024-03-02 DIAGNOSIS — I130 Hypertensive heart and chronic kidney disease with heart failure and stage 1 through stage 4 chronic kidney disease, or unspecified chronic kidney disease: Secondary | ICD-10-CM | POA: Diagnosis not present

## 2024-03-02 DIAGNOSIS — G2581 Restless legs syndrome: Secondary | ICD-10-CM | POA: Insufficient documentation

## 2024-03-02 DIAGNOSIS — G4733 Obstructive sleep apnea (adult) (pediatric): Secondary | ICD-10-CM

## 2024-03-02 LAB — BASIC METABOLIC PANEL WITH GFR
Anion gap: 8 (ref 5–15)
BUN: 6 mg/dL — ABNORMAL LOW (ref 8–23)
CO2: 22 mmol/L (ref 22–32)
Calcium: 8.1 mg/dL — ABNORMAL LOW (ref 8.9–10.3)
Chloride: 102 mmol/L (ref 98–111)
Creatinine, Ser: 0.53 mg/dL (ref 0.44–1.00)
GFR, Estimated: >60 mL/min (ref >60)
Glucose, Bld: 148 mg/dL — ABNORMAL HIGH (ref 70–99)
Potassium: 4 mmol/L (ref 3.5–5.1)
Sodium: 132 mmol/L — ABNORMAL LOW (ref 135–145)

## 2024-03-02 LAB — BRAIN NATRIURETIC PEPTIDE: B Natriuretic Peptide: 559.7 pg/mL — ABNORMAL HIGH (ref 0.0–100.0)

## 2024-03-02 MED ORDER — SPIRONOLACTONE 25 MG PO TABS: 12.5000 mg | ORAL_TABLET | Freq: Every day | ORAL | 3 refills | Status: AC

## 2024-03-02 MED ORDER — FUROSEMIDE 40 MG PO TABS: 40.0000 mg | ORAL_TABLET | Freq: Two times a day (BID) | ORAL | 3 refills | Status: AC

## 2024-03-02 NOTE — Patient Instructions (Addendum)
 INCREASE Lasix  to 40 mg Twice daily  START Spironolactone  12.5 mg ( 1/2 Tab) daily.  Labs done today, your results will be available in MyChart, we will contact you for abnormal readings.  Repeat blood work in 10 days.  Thank you for allowing us  to provider your heart failure care after your recent hospitalization. Please follow-up as scheduled.  If you have any questions, issues, or concerns before your next appointment please call our office at [phone], opt. 2 and leave a message for the triage nurse.

## 2024-03-04 ENCOUNTER — Other Ambulatory Visit: Payer: Self-pay | Admitting: *Deleted

## 2024-03-04 NOTE — Patient Instructions (Signed)
 Visit Information  Thank you for taking time to visit with me today. Please don't hesitate to contact me if I can be of assistance to you before our next scheduled telephone appointment.  Our next appointment is by telephone on 03/10/24 @ 215 pm  Following is a copy of your care plan:   Goals Addressed             This Visit's Progress    VBCI Transitions of Care (TOC) Care Plan       Problems:  Recent Hospitalization for treatment of CHF No Specialist appointment pt reports she has contact # for GI, will call to schedule Pt lives with spouse, pt is working with [facility] Pt reports chronic GI bleeding due to 2 bleeding ulcers Pt has finished antibiotic for UTI Pt declined home health, will be starting outpatient therapy on 03/08/24 Saw cardiologist 03/02/24 CBG fasting 60 today, pt states due to she did not eat dinner yesterday evening,  after eating CBG increased to 74,84,94  Goal:  Over the next 30 days, the patient will not experience hospital readmission  Interventions:  Heart Failure Interventions: Provided education on low sodium diet Discussed importance of daily weight and advised patient to weigh and record daily Discussed the importance of keeping all appointments with provider Reinforced Heart Failure action plan Reviewed signs/ symptoms UTI, prevention Reviewed signs /symptoms GI bleeding, action plan, call provider for any increase in bleeding, seek emergency assistance as needed Reviewed all upcoming scheduled appointments Encouraged small frequent meals with adequate protein Reinforced carbohydrate modified diet Reinforced correlation of elevated CBG due to infection Reviewed signs /symptoms hypoglycemia, actions to take and importance of eating 3 meals per day and not skipping any meals  Patient Self Care Activities:  Attend all scheduled provider appointments Attend church or other social activities Call pharmacy for medication refills 3-7 days  in advance of running out of medications Call provider office for new concerns or questions  Notify RN Care Manager of TOC call rescheduling needs Participate in Transition of Care Program/Attend TOC scheduled calls Take medications as prescribed   call office if I gain more than 2 pounds in one day or 5 pounds in one week keep legs up while sitting watch for swelling in feet, ankles and legs every day weigh myself daily develop a rescue plan follow rescue plan if symptoms flare-up eat more whole grains, fruits and vegetables, lean meats and healthy fats track symptoms and what helps feel better or worse dress right for the weather, hot or cold Eat small, frequent meals, include adequate protein at each meal Please follow carbohydrate modified diet Follow up with GI, report any increase in GI bleeding Eat 3 meals per day, do not skip meals Follow RULE OF 15 for low blood sugar management:  How to treat low blood sugars (Blood sugar less than 70 mg/dl  Please follow the RULE OF 15 for the treatment of hypoglycemia treatment (When your blood sugars are less than 70 mg/ dl) STEP  1:  Take 15 grams of carbohydrates when your blood sugar is low, which includes One tube of glucose gel STEP 2:  Recheck blood sugar in 15 minutes STEP 3:  3-4 glucose tabs or  3-4 oz of juice or regular soda or If your blood sugar is still low at the 15 minute recheck ---then, go back to STEP 1 and treat again with another 15 grams of carbohydrates  Plan:  Telephone follow up appointment with care management  team member scheduled for:  10/9/ 25 @ 215 pm The patient has been provided with contact information for the care management team and has been advised to call with any health related questions or concerns.         Patient verbalizes understanding of instructions and care plan provided today and agrees to view in MyChart. Active MyChart status and patient understanding of how to access instructions and  care plan via MyChart confirmed with patient.     Telephone follow up appointment with care management team member scheduled for: 03/10/24 @ 215 pm  Please call the care guide team at [phone] if you need to cancel or reschedule your appointment.   Please call the Suicide and Crisis Lifeline: 988 call the [place]  National Suicide Prevention Lifeline: (724)237-9926 or TTY: 9200494096 TTY 215 317 5760) to talk to a trained counselor call 1-800-273-TALK (toll free, 24 hour hotline) go to [facility] [address] [place] [phone]) call the [facility] Crisis Line: [phone]) [phone] call 911 if you are experiencing a Mental Health or Behavioral Health Crisis or need someone to talk to.  Mliss Creed [facility], BSN RN Care Manager/ Transition of Care [place]/ [facility] [phone]

## 2024-03-04 NOTE — Patient Outreach (Signed)
 Transition of Care week 2  Visit Note  03/04/2024  Name: Amber Stephenson MRN: [id number]          DOB: [birth year]  Situation: Patient enrolled in [facility] 30-day program. Visit completed with patient by telephone.   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   Benign paroxysmal positional vertigo due to bilateral vestibular disorder 05/20/2019   Bipolar 1 disorder (HCC) 01/23/2015   with GAD, benzo dependence   CAD (coronary artery disease)    Nonobstructive; Managed by Dr. Charls   Cardiomegaly 01/12/2018   CHF (congestive heart failure) 06/08/2022   Chronic back pain 01/04/2015   Chronic constipation 04/25/2020   Chronic diastolic heart failure (HCC) 05/30/2015   Chronic pain syndrome 08/22/2019   back pain, sacroiliitis   Chronic post-traumatic stress disorder (PTSD) 12/06/2020   Diabetic neuropathy (HCC) 02/06/2016   Dyslipidemia 09/24/2020   Essential hypertension    Functional diarrhea 10/26/2020   Herpes genitalis in women 07/16/2015   History of adenomatous polyp of colon 05/21/2019   Overview:   03/31/17: Colonoscopy: nonadvanced adenoma, microscopic colitis, f/u 5 yrs, [facility]/GAP   History of radiation therapy    Endometrial- 02/18/23-03/31/23-Dr. Lynwood Kinard   Hypotension 09/01/2022   Insomnia 01/23/2015   Metabolic acidosis 01/12/2023   Migraine headache with aura 02/12/2016   Myofascial pain dysfunction syndrome 08/22/2019   Non-alcoholic fatty liver disease 01/12/2018   OSA (obstructive sleep apnea) 02/24/2019   10/09/2018 - HST  - AHI 40.6    Osteopenia 12/06/2020   Rx alendronate  35 mg.   Pinguecula 12/06/2020   Presbyopia 12/06/2020   Pulmonary hypertension    RLS  (restless legs syndrome) 04/27/2015   RSV (respiratory syncytial virus infection) 06/10/2023   Tremor, essential 12/11/2021   Type II diabetes mellitus (HCC) 09/24/2020    Assessment: Patient Reported Symptoms: Cognitive Cognitive Status: No symptoms reported, Able to follow simple commands, Alert and oriented to person, place, and time      Neurological Neurological Review of Symptoms: No symptoms reported    HEENT HEENT Symptoms Reported: No symptoms reported      Cardiovascular Cardiovascular Symptoms Reported: No symptoms reported Does patient have uncontrolled Hypertension?: No Cardiovascular Comment: pt saw cardiologist 10/1, pt is weighing daily, does not provide weight  Respiratory Respiratory Symptoms Reported: No symptoms reported    Endocrine Endocrine Symptoms Reported: No symptoms reported Is patient diabetic?: Yes Is patient checking blood sugars at home?: Yes List most recent blood sugar readings, include date and time of day:  (pt states  low blood sugar becasue I didn't eat supper last night) Endocrine Self-Management Outcome: 4 (good) Endocrine Comment: Reinforced carbohydrate modified diet, reviewed signs/ symptoms hypoglycemia, interventions  Gastrointestinal Gastrointestinal Symptoms Reported: Bleeding Additional Gastrointestinal Details: pt states she has chronic GI bleeding from 2 bleeding ulcers, pt follows up with GI, will be having colonoscopy Gastrointestinal Management Strategies: Adequate rest Gastrointestinal Comment: Reinforced signs /symptoms GI bleeding    Genitourinary Genitourinary Symptoms Reported: No symptoms reported Additional Genitourinary Details: pt states UTI resolved, finished antibiotics Genitourinary Management Strategies: Adequate rest Genitourinary Self-Management Outcome: 4 (good) Genitourinary Comment: reinforced signs/ symptoms UTI, reportable signs/ symptoms  Integumentary Integumentary Symptoms Reported: No symptoms  reported    Musculoskeletal Musculoskelatal Symptoms  Reviewed: No symptoms reported        Psychosocial Psychosocial Symptoms Reported: No symptoms reported         There were no vitals filed for this visit.  Medications Reviewed Today     Reviewed by Aura Mliss LABOR, RN (Registered Nurse) on 03/04/24 at 1056  Med List Status: <None>   Medication Order Taking? Sig Documenting Provider Last Dose Status Informant  acetaminophen  (TYLENOL ) 325 MG tablet [id number]  Take 2 tablets (650 mg total) by mouth every 6 (six) hours as needed for mild pain (pain score 1-3) or fever (or Fever >/= 101). Pearlean Manus, MD  Active Spouse/Significant Other, Pharmacy Records  ALPRAZolam  (XANAX ) 1 MG tablet [id number]  Take 1 mg by mouth at bedtime as needed for sleep. [provider]  Active Spouse/Significant Other, Pharmacy Records  ARIPiprazole  (ABILIFY ) 2 MG tablet [id number]  Take 1 tablet (2 mg total) by mouth at bedtime. Pearlean Manus, MD  Active Spouse/Significant Other, Pharmacy Records  atorvastatin  (LIPITOR) 80 MG tablet [id number]  Take 1 tablet (80 mg total) by mouth daily. Pearlean Manus, MD  Active Spouse/Significant Other, Pharmacy Records  bupivacaine -epinephrine  (MARCAINE  W/ EPI) 0.5% -1:200000 injection [id number]   Hollis, Kevin D, MD  Active   carbidopa -levodopa  (SINEMET ) 10-100 MG tablet [id number]  Take 1 tablet by mouth 4 (four) times daily. For restless leg syndrome Pearlean Manus, MD  Active Spouse/Significant Other, Pharmacy Records  colchicine  0.6 MG tablet [id number]  Take 1 tablet (0.6 mg total) by mouth every 12 (twelve) hours as needed (gout flare.). For mouth sores Ricky Fines, MD  Active   Continuous Glucose Sensor (DEXCOM G7 SENSOR) [place] [id number]  CHANGE SENSOR EVERY 10 DAYS Shamleffer, Donell Cardinal, MD  Active Spouse/Significant Other, Pharmacy Records  cyanocobalamin  (VITAMIN B12) 1000 MCG tablet [id number]  Take 1 tablet (1,000 mcg total) by mouth daily.  Raenelle Donalda HERO, MD  Active Spouse/Significant Other, Pharmacy Records  denosumab  (PROLIA ) injection 60 mg [id number]   Stacks, Warren, MD  Active   diphenoxylate -atropine  (LOMOTIL ) 2.5-0.025 MG tablet [id number]  TAKE 2 TABLETS BY MOUTH FOUR TIMES DAILY AS NEEDED FOR DIARRHEA OR LOOSE STOOLS  Patient not taking: Reported on 03/02/2024   [name], MD  Active Spouse/Significant Other, Pharmacy Records  ELIQUIS  5 MG TABS tablet [id number]  TAKE 1 TABLET TWICE A DAY Turner, Wilbert SAUNDERS, MD  Active Spouse/Significant Other, Pharmacy Records  escitalopram  (LEXAPRO ) 10 MG tablet [id number]  Take 10 mg by mouth daily. [provider]  Active Spouse/Significant Other, Pharmacy Records           Med Note (WARD, ANGELICA G   Tue Jan 12, 2024  2:28 PM)    ferrous sulfate  325 (65 FE) MG EC tablet [id number]  Take 1 tablet (325 mg total) by mouth 2 (two) times daily. Ricky Fines, MD  Active   furosemide  (LASIX ) 40 MG tablet [id number] Yes Take 1 tablet (40 mg total) by mouth 2 (two) times daily. Hayes Beckey CROME, NP  Active   gabapentin  (NEURONTIN ) 100 MG capsule [id number]  Take 1 capsule (100 mg total) by mouth 3 (three) times daily. Pearlean Manus, MD  Active Spouse/Significant Other, Pharmacy Records  hydrocortisone  (ANUSOL -HC) 2.5 % rectal cream [id number]  Place rectally 2 (two) times daily. Ricky Fines, MD  Active   hydrOXYzine  (VISTARIL ) 25 MG capsule [id number]  Take 1 capsule (25 mg total) by mouth every 8 (eight) hours as needed for anxiety. Pearlean Manus, MD  Active Spouse/Significant Other, Pharmacy Records  insulin  glargine (  LANTUS  SOLOSTAR) 100 UNIT/ML Solostar Pen [id number]  Inject 10 Units into the skin at bedtime. Pearlean Manus, MD  Active Spouse/Significant Other, Pharmacy Records  insulin  lispro (HUMALOG  KWIKPEN) 100 UNIT/ML KwikPen [id number]  Inject 0-10 Units into the skin 2 (two) times daily before a meal. Sliding scale Short acting Humalog  insulin  per sliding scale 0-10 ---:--  Insulin  injection 0-10 Units 0-10 Units Subcutaneous, 3 times daily with meals CBG 70 - 120: 0 unit CBG 121 - 150: 0 unit  CBG 151 - 200: 1 unit CBG 201 - 250: 2 units CBG 251 - 300: 4 units CBG 301 - 350: 6 units  CBG 351 - 400: 8 units  CBG > 400: 10 units Emokpae, Courage, MD  Active Spouse/Significant Other, Pharmacy Records  Insulin  Pen Needle 29G X MISC [id number]  1 Device by Does not apply route daily in the afternoon. Shamleffer, Donell Cardinal, MD  Active Spouse/Significant Other, Pharmacy Records  lidocaine  (LIDODERM ) 5 % [id number]  Place 1 patch onto the skin daily. Remove & Discard patch within 12 hours or as directed by MD Ricky Fines, MD  Active   losartan  (COZAAR ) 25 MG tablet [id number]  Take 25 mg by mouth daily. [provider]  Active Spouse/Significant Other, Pharmacy Records  MAGNESIUM  PO [id number]  Take 1 tablet by mouth daily. [provider]  Active Spouse/Significant Other, Pharmacy Records  Methocarbamol  1000 MG TABS [id number]  Take 1,000 mg by mouth every 6 (six) hours as needed. Lovorn, Megan, MD  Active Spouse/Significant Other, Pharmacy Records  metoprolol  tartrate (LOPRESSOR ) 50 MG tablet [id number]  Take 1 tablet (50 mg total) by mouth 2 (two) times daily. Pearlean Manus, MD  Active Spouse/Significant Other, Pharmacy Records  Misc. Devices (3-IN-1 BEDSIDE TOILET) MISC [id number]  Length of need 99 months M17.11 Osteoarthritis right knee / multiple falls / high risk fall Margrette Taft BRAVO, MD  Active Spouse/Significant Other, Pharmacy Records  ondansetron  (ZOFRAN ) 4 MG tablet [id number]  Take 1 tablet (4 mg total) by mouth every 8 (eight) hours as needed for nausea or vomiting. For cancer related nausea Dettinger, Fonda LABOR, MD  Active Spouse/Significant Other, Pharmacy Records  OXcarbazepine  (TRILEPTAL ) 150 MG tablet [id number]  Take 1 tablet (150 mg total) by mouth 2 (two) times daily. Sheikh, Omair [place], DO  Active Spouse/Significant Other,  Pharmacy Records  oxyCODONE  (OXYCONTIN ) 20 mg 12 hr tablet [id number]  Take 1 tablet (20 mg total) by mouth every 12 (twelve) hours. G89.3- chronic pain- fill 28 days after 8/25- Rx Lovorn, Megan, MD  Active   pantoprazole  (PROTONIX ) 40 MG tablet [id number]  Take 1 tablet (40 mg total) by mouth 2 (two) times daily. Pearlean Manus, MD  Active Spouse/Significant Other, Pharmacy Records  potassium chloride  (KLOR-CON ) 10 MEQ tablet [id number]  Take 1 tablet (10 mEq total) by mouth daily. Shlomo Wilbert SAUNDERS, MD  Active Spouse/Significant Other, Pharmacy Records  rOPINIRole  (REQUIP ) 2 MG tablet [id number]  Take 1 tablet (2 mg total) by mouth at bedtime. [name], MD  Active Spouse/Significant Other, Pharmacy Records  spironolactone  (ALDACTONE ) 25 MG tablet [id number]  Take 0.5 tablets (12.5 mg total) by mouth daily. Hayes Beckey CROME, NP  Active   tirzepatide  (MOUNJARO ) 12.5 MG/0.5ML Pen [id number]  Inject 12.5 mg into the skin once a week. Shamleffer, Donell Cardinal, MD  Active Spouse/Significant Other, Pharmacy Records  vitamin C  (VITAMIN C ) 500 MG tablet [id number]  Take 0.5 tablets (250 mg total) by mouth 2 (two) times daily. Ricky Fines, MD  Active  Vitamin D , Cholecalciferol , 25 MCG (1000 UT) TABS [id number]  Take 1,000 Units by mouth in the morning. [provider]  Active Spouse/Significant Other, Pharmacy Records  Med List Note (Ward, Angelica, CPhT 01/12/24 1617): [employer] insurance, pt's spouse assists with meds            Goals Addressed             This Visit's Progress    [facility] Transitions of Care (TOC) Care Plan       Problems:  Recent Hospitalization for treatment of CHF No Specialist appointment pt reports she has contact # for GI, will call to schedule Pt lives with spouse, pt is working with [facility] Pt reports chronic GI bleeding due to 2 bleeding ulcers Pt has finished antibiotic for UTI Pt declined home health, will be starting outpatient therapy on  03/08/24 Saw cardiologist 03/02/24 CBG fasting 60 today, pt states due to she did not eat dinner yesterday evening,  after eating CBG increased to 74,84,94  Goal:  Over the next 30 days, the patient will not experience hospital readmission  Interventions:  Heart Failure Interventions: Provided education on low sodium diet Discussed importance of daily weight and advised patient to weigh and record daily Discussed the importance of keeping all appointments with provider Reinforced Heart Failure action plan Reviewed signs/ symptoms UTI, prevention Reviewed signs /symptoms GI bleeding, action plan, call provider for any increase in bleeding, seek emergency assistance as needed Reviewed all upcoming scheduled appointments Encouraged small frequent meals with adequate protein Reinforced carbohydrate modified diet Reinforced correlation of elevated CBG due to infection Reviewed signs /symptoms hypoglycemia, actions to take and importance of eating 3 meals per day and not skipping any meals  Patient Self Care Activities:  Attend all scheduled provider appointments Attend church or other social activities Call pharmacy for medication refills 3-7 days in advance of running out of medications Call provider office for new concerns or questions  Notify RN Care Manager of TOC call rescheduling needs Participate in Transition of Care Program/Attend TOC scheduled calls Take medications as prescribed   call office if I gain more than 2 pounds in one day or 5 pounds in one week keep legs up while sitting watch for swelling in feet, ankles and legs every day weigh myself daily develop a rescue plan follow rescue plan if symptoms flare-up eat more whole grains, fruits and vegetables, lean meats and healthy fats track symptoms and what helps feel better or worse dress right for the weather, hot or cold Eat small, frequent meals, include adequate protein at each meal Please follow carbohydrate  modified diet Follow up with GI, report any increase in GI bleeding Eat 3 meals per day, do not skip meals Follow RULE OF 15 for low blood sugar management:  How to treat low blood sugars (Blood sugar less than 70 mg/dl  Please follow the RULE OF 15 for the treatment of hypoglycemia treatment (When your blood sugars are less than 70 mg/ dl) STEP  1:  Take 15 grams of carbohydrates when your blood sugar is low, which includes One tube of glucose gel STEP 2:  Recheck blood sugar in 15 minutes STEP 3:  3-4 glucose tabs or  3-4 oz of juice or regular soda or If your blood sugar is still low at the 15 minute recheck ---then, go back to STEP 1 and treat again with another 15 grams of carbohydrates  Plan:  Telephone follow up appointment with care management team  member scheduled for:  10/9/ 25 @ 215 pm The patient has been provided with contact information for the care management team and has been advised to call with any health related questions or concerns.          Recommendation:   PCP Follow-up 10/8- pain management Outpatient physical therapy  Follow Up Plan:   Telephone follow-up 03/10/24 @ 215 pm  Mliss Creed [facility], BSN RN Care Manager/ Transition of Care [place]/ [facility] [phone]

## 2024-03-07 ENCOUNTER — Encounter: Payer: Self-pay | Admitting: Family Medicine

## 2024-03-07 ENCOUNTER — Ambulatory Visit (INDEPENDENT_AMBULATORY_CARE_PROVIDER_SITE_OTHER)

## 2024-03-07 ENCOUNTER — Ambulatory Visit: Admitting: Family Medicine

## 2024-03-07 ENCOUNTER — Ambulatory Visit: Admitting: Psychiatry

## 2024-03-07 ENCOUNTER — Ambulatory Visit: Payer: Self-pay

## 2024-03-07 VITALS — BP 122/81 | HR 93 | Temp 97.9°F | Ht 62.0 in | Wt 165.4 lb

## 2024-03-07 DIAGNOSIS — G8929 Other chronic pain: Secondary | ICD-10-CM

## 2024-03-07 DIAGNOSIS — M79671 Pain in right foot: Secondary | ICD-10-CM

## 2024-03-07 DIAGNOSIS — M79672 Pain in left foot: Secondary | ICD-10-CM

## 2024-03-07 NOTE — Telephone Encounter (Signed)
 Patient has appt scheduled in office today

## 2024-03-07 NOTE — Progress Notes (Signed)
 Subjective: CC: Chronic foot pain PCP: [name], MD YEP:Uzmzdj D Lippe is a 78 y.o. female who is accompanied today by her spouse.  She is presenting to clinic today for:  Chronic bilateral foot pain Patient reports that she has had chronic bilateral foot pain for quite some time but it seems to be exacerbated as of late.  She points to the lateral and medial aspects of bilateral ankles and the dorsal aspect of bilateral 1st and 2nd MTPs as the source of discomfort.  Pain is worse with plantarflexion of the feet and ankle and better with dorsiflexion.  She had some substantial lower extremity edema when she was in the hospital for acute on chronic heart failure but notes that that has gotten greatly better after use of diuretics.  She is under the care of pain management but notes that nobody has addressed her foot pain ever.  She has also seen podiatry in the past and still sees Dr. Roddie but no prescribed orthotics to help with the discomfort.  She is currently treated with gabapentin  which was started just a couple of weeks ago, OxyContin , Lidoderm  patches.  However nothing really seems to help.  She has an upcoming appointment with Dr. Lavorn and her psychiatrist.  Her husband has been trying to encourage her to walk more.  She notes that typically she comes in a wheelchair   ROS: Per HPI  Allergies  Allergen Reactions   Iodinated Contrast Media Anaphylaxis, Swelling and Other (See Comments)    Throat closes, swelling of throat and tongue   Iodine Anaphylaxis   Latex Other (See Comments)    Latex tape pulls skin with it   Tape Other (See Comments)    Pulls off the skin, if latex   Tizanidine  Other (See Comments)    Weakness, goofy, bad dreams   Past Medical History:  Diagnosis Date   Allergic rhinitis 02/24/2019   Ambulatory dysfunction 04/23/2020   Atrial fibrillation with RVR (HCC) 05/19/2017   Back pain/sacroiliitis--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   Benign paroxysmal positional vertigo due to bilateral vestibular disorder 05/20/2019   Bipolar 1 disorder (HCC) 01/23/2015   with GAD, benzo dependence   CAD (coronary artery disease)    Nonobstructive; Managed by Dr. Charls   Cardiomegaly 01/12/2018   CHF (congestive heart failure) 06/08/2022   Chronic back pain 01/04/2015   Chronic constipation 04/25/2020   Chronic diastolic heart failure (HCC) 05/30/2015   Chronic pain syndrome 08/22/2019   back pain, sacroiliitis   Chronic post-traumatic stress disorder (PTSD) 12/06/2020   Diabetic neuropathy (HCC) 02/06/2016   Dyslipidemia 09/24/2020   Essential hypertension    Functional diarrhea 10/26/2020   Herpes genitalis in women 07/16/2015   History of adenomatous polyp of colon 05/21/2019   Overview:   03/31/17: Colonoscopy: nonadvanced adenoma, microscopic colitis, f/u 5 yrs, [facility]/GAP   History of radiation therapy    Endometrial- 02/18/23-03/31/23-Dr. Lynwood Kinard   Hypotension 09/01/2022   Insomnia 01/23/2015   Metabolic acidosis 01/12/2023   Migraine headache with aura 02/12/2016   Myofascial pain dysfunction syndrome 08/22/2019   Non-alcoholic fatty liver disease 01/12/2018   OSA (obstructive sleep apnea) 02/24/2019   10/09/2018 - HST  - AHI 40.6    Osteopenia 12/06/2020   Rx alendronate  35 mg.   Pinguecula 12/06/2020   Presbyopia 12/06/2020   Pulmonary hypertension    RLS (restless legs syndrome)  04/27/2015   RSV (respiratory syncytial virus infection) 06/10/2023   Tremor, essential 12/11/2021   Type II diabetes mellitus (HCC) 09/24/2020    Current Outpatient Medications:    acetaminophen  (TYLENOL ) 325 MG tablet, Take 2 tablets (650 mg total) by mouth every 6 (six) hours as needed for mild pain (pain score 1-3) or fever (or Fever >/= 101)., Disp: , Rfl:    ALPRAZolam  (XANAX ) 1 MG tablet, Take 1 mg by mouth at bedtime as needed  for sleep., Disp: , Rfl:    ARIPiprazole  (ABILIFY ) 2 MG tablet, Take 1 tablet (2 mg total) by mouth at bedtime., Disp: 30 tablet, Rfl: 5   atorvastatin  (LIPITOR) 80 MG tablet, Take 1 tablet (80 mg total) by mouth daily., Disp: 90 tablet, Rfl: 3   carbidopa -levodopa  (SINEMET ) 10-100 MG tablet, Take 1 tablet by mouth 4 (four) times daily. For restless leg syndrome, Disp: 120 tablet, Rfl: 5   colchicine  0.6 MG tablet, Take 1 tablet (0.6 mg total) by mouth every 12 (twelve) hours as needed (gout flare.). For mouth sores, Disp: , Rfl:    Continuous Glucose Sensor (DEXCOM G7 SENSOR) MISC, CHANGE SENSOR EVERY 10 DAYS, Disp: 9 each, Rfl: 3   cyanocobalamin  (VITAMIN B12) 1000 MCG tablet, Take 1 tablet (1,000 mcg total) by mouth daily., Disp: 30 tablet, Rfl: 1   ELIQUIS  5 MG TABS tablet, TAKE 1 TABLET TWICE A DAY, Disp: 180 tablet, Rfl: 3   escitalopram  (LEXAPRO ) 10 MG tablet, Take 10 mg by mouth daily., Disp: , Rfl:    ferrous sulfate  325 (65 FE) MG EC tablet, Take 1 tablet (325 mg total) by mouth 2 (two) times daily., Disp: 60 tablet, Rfl: 3   furosemide  (LASIX ) 40 MG tablet, Take 1 tablet (40 mg total) by mouth 2 (two) times daily., Disp: 180 tablet, Rfl: 3   gabapentin  (NEURONTIN ) 100 MG capsule, Take 1 capsule (100 mg total) by mouth 3 (three) times daily., Disp: 90 capsule, Rfl: 3   hydrocortisone  (ANUSOL -HC) 2.5 % rectal cream, Place rectally 2 (two) times daily., Disp: 30 g, Rfl: 0   hydrOXYzine  (VISTARIL ) 25 MG capsule, Take 1 capsule (25 mg total) by mouth every 8 (eight) hours as needed for anxiety., Disp: 30 capsule, Rfl: 5   insulin  glargine (LANTUS  SOLOSTAR) 100 UNIT/ML Solostar Pen, Inject 10 Units into the skin at bedtime., Disp: 9 mL, Rfl: 0   insulin  lispro (HUMALOG  KWIKPEN) 100 UNIT/ML KwikPen, Inject 0-10 Units into the skin 2 (two) times daily before a meal. Sliding scale Short acting Humalog  insulin  per sliding scale 0-10 ---:-- Insulin  injection 0-10 Units 0-10 Units Subcutaneous, 3 times  daily with meals CBG 70 - 120: 0 unit CBG 121 - 150: 0 unit  CBG 151 - 200: 1 unit CBG 201 - 250: 2 units CBG 251 - 300: 4 units CBG 301 - 350: 6 units  CBG 351 - 400: 8 units  CBG > 400: 10 units, Disp: 18 mL, Rfl: 1   Insulin  Pen Needle 29G X MISC, 1 Device by Does not apply route daily in the afternoon., Disp: 400 each, Rfl: 3   lidocaine  (LIDODERM ) 5 %, Place 1 patch onto the skin daily. Remove & Discard patch within 12 hours or as directed by MD, Disp: 30 patch, Rfl: 0   losartan  (COZAAR ) 25 MG tablet, Take 25 mg by mouth daily., Disp: , Rfl:    MAGNESIUM  PO, Take 1 tablet by mouth daily., Disp: , Rfl:    Methocarbamol  1000 MG TABS,  Take 1,000 mg by mouth every 6 (six) hours as needed., Disp: 120 tablet, Rfl: 5   metoprolol  tartrate (LOPRESSOR ) 50 MG tablet, Take 1 tablet (50 mg total) by mouth 2 (two) times daily., Disp: 180 tablet, Rfl: 3   Misc. Devices (3-IN-1 BEDSIDE TOILET) MISC, Length of need 99 months M17.11 Osteoarthritis right knee / multiple falls / high risk fall, Disp: 1 each, Rfl: 1   ondansetron  (ZOFRAN ) 4 MG tablet, Take 1 tablet (4 mg total) by mouth every 8 (eight) hours as needed for nausea or vomiting. For cancer related nausea, Disp: 30 tablet, Rfl: 1   OXcarbazepine  (TRILEPTAL ) 150 MG tablet, Take 1 tablet (150 mg total) by mouth 2 (two) times daily., Disp: 60 tablet, Rfl: 0   oxyCODONE  (OXYCONTIN ) 20 mg 12 hr tablet, Take 1 tablet (20 mg total) by mouth every 12 (twelve) hours. G89.3- chronic pain- fill 28 days after 8/25- Rx, Disp: 60 tablet, Rfl: 0   pantoprazole  (PROTONIX ) 40 MG tablet, Take 1 tablet (40 mg total) by mouth 2 (two) times daily., Disp: 60 tablet, Rfl: 5   potassium chloride  (KLOR-CON ) 10 MEQ tablet, Take 1 tablet (10 mEq total) by mouth daily., Disp: 90 tablet, Rfl: 1   rOPINIRole  (REQUIP ) 2 MG tablet, Take 1 tablet (2 mg total) by mouth at bedtime., Disp: 90 tablet, Rfl: 0   spironolactone  (ALDACTONE ) 25 MG tablet, Take 0.5 tablets (12.5 mg total) by  mouth daily., Disp: 45 tablet, Rfl: 3   tirzepatide  (MOUNJARO ) 12.5 MG/0.5ML Pen, Inject 12.5 mg into the skin once a week., Disp: 6 mL, Rfl: 3   vitamin C  (VITAMIN C ) 500 MG tablet, Take 0.5 tablets (250 mg total) by mouth 2 (two) times daily., Disp: 60 tablet, Rfl: 2   Vitamin D , Cholecalciferol , 25 MCG (1000 UT) TABS, Take 1,000 Units by mouth in the morning., Disp: , Rfl:    diphenoxylate -atropine  (LOMOTIL ) 2.5-0.025 MG tablet, TAKE 2 TABLETS BY MOUTH FOUR TIMES DAILY AS NEEDED FOR DIARRHEA OR LOOSE STOOLS (Patient not taking: Reported on 03/07/2024), Disp: 30 tablet, Rfl: 5  Current Facility-Administered Medications:    denosumab  (PROLIA ) injection 60 mg, 60 mg, Subcutaneous, Q6 months, Stacks, Warren, MD, 60 mg at 01/15/24 1459  Facility-Administered Medications Ordered in Other Visits:    bupivacaine -epinephrine  (MARCAINE  W/ EPI) 0.5% -1:200000 injection, , , Anesthesia Intra-op, Tilford Franky BIRCH, MD, 12 mL at 01/21/19 0935 Social History   Socioeconomic History   Marital status: Married    Spouse name: Financial planner   Number of children: 3   Years of education: 15   Highest education level: Tax adviser degree: academic program  Occupational History   Occupation: Retired    Comment: Chief Financial Officer  Tobacco Use   Smoking status: Never   Smokeless tobacco: Never  Vaping Use   Vaping status: Never Used  Substance and Sexual Activity   Alcohol  use: Not Currently    Alcohol /week: 0.0 standard drinks of alcohol    Drug use: No   Sexual activity: Not Currently    Partners: Male    Birth control/protection: Post-menopausal  Other Topics Concern   Not on file  Social History Narrative   Lives at home with husband.    They have 3 children who live away - [place] , [place] , and [place] .   She is from [place]  and most of her family lives there.   Her husbands's family live nearby   Right handed.   Social Drivers of Health   Financial Resource Strain: Low Risk  (08/13/2023)  Overall  Financial Resource Strain (CARDIA)    Difficulty of Paying Living Expenses: Not hard at all  Food Insecurity: No Food Insecurity (02/15/2024)   Hunger Vital Sign    Worried About Running Out of Food in the Last Year: Never true    Ran Out of Food in the Last Year: Never true  Transportation Needs: No Transportation Needs (02/15/2024)   PRAPARE - Administrator, Civil Service (Medical): No    Lack of Transportation (Non-Medical): No  Physical Activity: Insufficiently Active (08/13/2023)   Exercise Vital Sign    Days of Exercise per Week: 3 days    Minutes of Exercise per Session: 20 min  Stress: No Stress Concern Present (08/13/2023)   [employer] of Occupational Health - Occupational Stress Questionnaire    Feeling of Stress : Only a little  Social Connections: Socially Integrated (02/15/2024)   Social Connection and Isolation Panel    Frequency of Communication with Friends and Family: More than three times a week    Frequency of Social Gatherings with Friends and Family: Once a week    Attends Religious Services: More than 4 times per year    Active Member of [employer] or Organizations: Yes    Attends Engineer, structural: More than 4 times per year    Marital Status: Married  Catering manager Violence: Not At Risk (02/15/2024)   Humiliation, Afraid, Rape, and Kick questionnaire    Fear of Current or Ex-Partner: No    Emotionally Abused: No    Physically Abused: No    Sexually Abused: No   Family History  Problem Relation Age of Onset   Diabetes Mother    Heart disease Mother    Alzheimer's disease Father    Heart disease Sister        CABG   Diabetes Sister    Alcohol  abuse Sister    Stroke Brother    Heart disease Brother    Mental illness Brother    Diabetes Brother    Breast cancer Neg Hx    Ovarian cancer Neg Hx    Colon cancer Neg Hx    Endometrial cancer Neg Hx     Objective: Office vital signs reviewed. BP 122/81   Pulse 93   Temp 97.9  F (36.6 C)   Ht 5' 2 (1.575 m)   Wt 165 lb 6 oz (75 kg)   SpO2 93%   BMI 30.25 kg/m   Physical Examination:  General: Awake, alert, obese, No acute distress MSK: Antalgic gait but ambulates independently with minimal assistance.  No gross deformity, swelling, erythema or warmth of the feet or ankles.  She has exquisite tenderness to palpation out of proportion to the exam along bilateral ankles and feet up into the shins bilaterally.  She has full active range of motion.  Assessment/ Plan: 78 y.o. female   Chronic foot pain, left - Plan: DG Foot Complete Left, DG Foot Complete Right  Chronic foot pain, right - Plan: DG Foot Complete Left, DG Foot Complete Right   I did go ahead and obtain plain films of the feet.  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  Also discussed potential need for something like Cymbalta  to help with pain but given that she is on other mental health medications this would certainly need to be approved by her psychiatrist.  Advised her to follow-up with both specialists and discuss this further.  Details provided in the AVS today.  Follow-up with PCP for ongoing chronic care needs   Norene CHRISTELLA Fielding, DO [facility] [phone]

## 2024-03-07 NOTE — Telephone Encounter (Signed)
 FYI Only or Action Required?: FYI only for provider.  Patient was last seen in primary care on 02/15/2024 by [name], MD.  Called Nurse Triage reporting Pain.  Symptoms began several weeks ago.  Interventions attempted: Nothing.  Symptoms are: gradually worsening.  Triage Disposition: See PCP When Office is Open (Within 3 Days)  Patient/caregiver understands and will follow disposition?: Yes    Copied from CRM [id number]. Topic: Clinical - Red Word Triage >> Mar 07, 2024  7:42 AM Larissa RAMAN wrote: [facility] that prompted transfer to Nurse Triage: pain in both feet Reason for Disposition  [1] MODERATE pain (e.g., interferes with normal activities, limping) AND [2] present > 3 days  Answer Assessment - Initial Assessment Questions 1. ONSET: When did the pain start?      X 3 weeks 2. LOCATION: Where is the pain located?      Bilateral feet 3. PAIN: How bad is the pain?    (Scale 1-10; or mild, moderate, severe)     8/10 4. WORK OR EXERCISE: Has there been any recent work or exercise that involved this part of the body?       no 5. CAUSE: What do you think is causing the foot pain?     Unsure: could been from fall x 1 month 6. OTHER SYMPTOMS: Do you have any other symptoms? (e.g., leg pain, rash, fever, numbness)     Swelling-mild 7. PREGNANCY: Is there any chance you are pregnant? When was your last menstrual period?     Na  Pain radiates to bilateral legs from feet to knees.  Protocols used: Foot Pain-A-AH

## 2024-03-07 NOTE — Patient Instructions (Signed)
 Ask your psychiatrist about maybe adding Cymbalta  to your regimen to help with what I suspect is a complex pain syndrome, possible fibromyalgia Recommend also speaking to Dr Lavorn about adjusting the gabapentin  dose but again, it may cause fluid retention so you will have to be very closely monitored if she makes any adjustments. Myofascial Pain Syndrome and Fibromyalgia Myofascial pain syndrome and fibromyalgia are both pain disorders. You may feel this pain mainly in your muscles. Myofascial pain syndrome: Always has tender points in the muscles that will cause pain when pressed (trigger points). The pain may come and go. Usually affects your neck, upper back, and shoulder areas. The pain often moves into your arms and hands. Fibromyalgia: Has muscle pains and tenderness that come and go. Is often associated with tiredness (fatigue) and sleep problems. Has trigger points. Tends to be long-lasting (chronic), but is not life-threatening. Fibromyalgia and myofascial pain syndrome are not the same. However, they often occur together. If you have both conditions, each can make the other worse. Both are common and can cause enough pain and fatigue to make day-to-day activities difficult. Both can be hard to diagnose because their symptoms are common in many other conditions. What are the causes? The exact causes of these conditions are not known. What increases the risk? You are more likely to develop either of these conditions if: You have a family history of the condition. You are female. You have certain triggers, such as: Spine disorders. An injury (trauma) or other physical stressors. Being under a lot of stress. Medical conditions such as osteoarthritis, rheumatoid arthritis, or lupus. What are the signs or symptoms? Fibromyalgia The main symptom of fibromyalgia is widespread pain and tenderness in your muscles. Pain is sometimes described as stabbing, shooting, or burning. You may also  have: Tingling or numbness. Sleep problems and fatigue. Problems with attention and concentration (fibro fog). Other symptoms may include: Bowel and bladder problems. Headaches. Vision problems. Sensitivity to odors and noises. Depression or mood changes. Painful menstrual periods (dysmenorrhea). Dry skin or eyes. These symptoms can vary over time. Myofascial pain syndrome Symptoms of myofascial pain syndrome include: Tight, ropy bands of muscle. Uncomfortable sensations in muscle areas. These may include aching, cramping, burning, numbness, tingling, and weakness. Difficulty moving certain parts of the body freely (poor range of motion). How is this diagnosed? This condition may be diagnosed by your symptoms and medical history. You will also have a physical exam. In general: Fibromyalgia is diagnosed if you have pain, fatigue, and other symptoms for more than 3 months, and symptoms cannot be explained by another condition. Myofascial pain syndrome is diagnosed if you have trigger points in your muscles, and those trigger points are tender and cause pain elsewhere in your body (referred pain). How is this treated? Treatment for these conditions depends on the type that you have. For fibromyalgia, a healthy lifestyle is the most important treatment including aerobic and strength exercises. Different types of medicines are used to help treat pain and include: NSAIDs. Medicines for treating depression. Medicines that help control seizures. Medicines that relax the muscles. Treatment for myofascial pain syndrome includes: Pain medicines, such as NSAIDs. Cooling and stretching of muscles. Massage therapy with myofascial release technique. Trigger point injections. Treating these conditions often requires a team of health care providers. These may include: Your primary care provider. A physical therapist. Complementary health care providers, such as massage therapists or  acupuncturists. A psychiatrist for cognitive behavioral therapy. Follow these instructions at home: Medicines  Take over-the-counter and prescription medicines only as told by your health care provider. Ask your health care provider if the medicine prescribed to you: Requires you to avoid driving or using machinery. Can cause constipation. You may need to take these actions to prevent or treat constipation: Drink enough fluid to keep your urine pale yellow. Take over-the-counter or prescription medicines. Eat foods that are high in fiber, such as beans, whole grains, and fresh fruits and vegetables. Limit foods that are high in fat and processed sugars, such as fried or sweet foods. Lifestyle  Do exercises as told by your health care provider or physical therapist. Practice relaxation techniques to control your stress. You may want to try: Biofeedback. Visual imagery. Hypnosis. Muscle relaxation. Yoga. Meditation. Maintain a healthy lifestyle. This includes eating a healthy diet and getting enough sleep. Do not use any products that contain nicotine or tobacco. These products include cigarettes, chewing tobacco, and vaping devices, such as e-cigarettes. If you need help quitting, ask your health care provider. General instructions Talk to your health care provider about complementary treatments, such as acupuncture or massage. Do not do activities that stress or strain your muscles. This includes repetitive motions and heavy lifting. Keep all follow-up visits. This is important. Where to find support Consider joining a support group with others who are diagnosed with this condition. National Fibromyalgia Association: fmaware.org Where to find more information U.S. Pain Foundation: uspainfoundation.org Contact a health care provider if: You have new symptoms. Your symptoms get worse or your pain is severe. You have side effects from your medicines. You have trouble sleeping. Your  condition is causing depression or anxiety. Get help right away if: You have thoughts of hurting yourself or others. Get help right away if you feel like you may hurt yourself or others, or have thoughts about taking your own life. Go to your nearest emergency room or: Call 911. Call the National Suicide Prevention Lifeline at 2484356493 or 988. This is open 24 hours a day. Text the Crisis Text Line at (607)073-1771. This information is not intended to replace advice given to you by your health care provider. Make sure you discuss any questions you have with your health care provider. Document Revised: 02/24/2022 Document Reviewed: 04/19/2021 Elsevier Patient Education  2024 [employer].

## 2024-03-08 ENCOUNTER — Telehealth: Payer: Self-pay | Admitting: *Deleted

## 2024-03-08 ENCOUNTER — Encounter: Payer: Self-pay | Admitting: Gastroenterology

## 2024-03-08 ENCOUNTER — Telehealth: Payer: Self-pay | Admitting: Family Medicine

## 2024-03-08 ENCOUNTER — Ambulatory Visit (INDEPENDENT_AMBULATORY_CARE_PROVIDER_SITE_OTHER): Admitting: Gastroenterology

## 2024-03-08 VITALS — BP 116/76 | HR 76 | Temp 97.7°F | Ht 62.0 in | Wt 165.4 lb

## 2024-03-08 DIAGNOSIS — Z860101 Personal history of adenomatous and serrated colon polyps: Secondary | ICD-10-CM

## 2024-03-08 DIAGNOSIS — K2970 Gastritis, unspecified, without bleeding: Secondary | ICD-10-CM

## 2024-03-08 DIAGNOSIS — L2989 Other pruritus: Secondary | ICD-10-CM | POA: Diagnosis not present

## 2024-03-08 DIAGNOSIS — K649 Unspecified hemorrhoids: Secondary | ICD-10-CM

## 2024-03-08 DIAGNOSIS — K259 Gastric ulcer, unspecified as acute or chronic, without hemorrhage or perforation: Secondary | ICD-10-CM | POA: Diagnosis not present

## 2024-03-08 DIAGNOSIS — K219 Gastro-esophageal reflux disease without esophagitis: Secondary | ICD-10-CM | POA: Diagnosis not present

## 2024-03-08 DIAGNOSIS — K5520 Angiodysplasia of colon without hemorrhage: Secondary | ICD-10-CM

## 2024-03-08 DIAGNOSIS — K5909 Other constipation: Secondary | ICD-10-CM | POA: Diagnosis not present

## 2024-03-08 DIAGNOSIS — D500 Iron deficiency anemia secondary to blood loss (chronic): Secondary | ICD-10-CM

## 2024-03-08 NOTE — Patient Instructions (Addendum)
 We will get you scheduled for repeat colonoscopy at the end of November/early December.  Even though clearance has been given before, we will need repeat clearance to hold your Eliquis  for 2 days prior.  We will contact you to get you scheduled once that is received and the schedule is open.  For your hemorrhoids I want you to continue the Anusol  cream but reduce to once daily for a couple weeks and then stop using it and monitor for any recurrent rectal pain, itching, or bleeding.  Continue taking your iron once daily.  Monitor for recurrent tarry stools, abdominal pain, bright red blood in your stools.  Please also let us  know if shortness of breath becomes worse than your baseline.  You may begin increasing your diet as tolerated.  You may resume all of your prior fibrous vegetables that you like to eat to help with your constipation.  You may continue taking Senokot twice daily and using suppository as needed for now.  I want you to consider potentially medication such as Amitiza or Trulance, you may look these up and let me know if this is something that you would like to try to help with your constipation if  increasing fiber in your diet is not helpful.  I want you to continue taking your pantoprazole  twice daily for now, after repeating your colonoscopy we may can consider decreasing this to once daily to allow further time for healing and to make sure there is not any recurrence of lower or upper GI bleed.  Please contact your previous provider in regards to Xanax  refill.  If you continue to have pruritus and/or anxiety and are not able to get your Xanax  refill please let me know and we can try low-dose hydroxyzine  (Atarax ) if no significant interactions with other medications.  I think it you will 1 time prescription of that and if that seems to work then you can talk with Dr. Zollie about continuing that afterwards.  Should have your hemoglobin rechecked in 2 months to ensure it is staying  stable.  It was a pleasure to see you today. I want to create trusting relationships with patients. If you receive a survey regarding your visit,  I greatly appreciate you taking time to fill this out on paper or through your MyChart. I value your feedback.  Charmaine Melia, MSN, FNP-BC, AGACNP-BC [facility]

## 2024-03-08 NOTE — Telephone Encounter (Signed)
 Still waiting to receive patients results. Will call patient with results once we receive them and provider has reviewed them.

## 2024-03-08 NOTE — Progress Notes (Signed)
 GI Office Note    Referring Provider: [name], MD Primary Care Physician:  [name], MD Primary Gastroenterologist: Lamar HERO.Rourk, MD  Date:  03/08/2024  ID:  Amber Stephenson, DOB February 15, 1946, MRN [id number]   Chief Complaint   Chief Complaint  Patient presents with   Follow-up    Hospital follow up.     History of Present Illness  Amber Stephenson is a 78 y.o. female with a history of endometrial cancer medically inoperable, s/p IMRT, A-fib on Eliquis , bipolar disorder, CAD, CHF, chronic pain, DM, HTN, herpes, NAFLD, OSA, osteopenia, RLS, pulmonary hypertension, and history of microscopic colitis presenting today for hospital follow-up with no reported complaints.  Colonoscopy 2018: - Hemorrhoids - 6 mm cecal adenoma - Sigmoid diverticulosis - Biopsies positive for microscopic colitis   Hospitalization in May 2025 with rectal bleeding/melena for some time, unable to say how long.  Had been seeing [facility] and was in the process of obtaining cardiac clearance for bidirectional endoscopic evaluation given she had been on Eliquis .  Endorsed diffuse abdominal pain with nausea but no vomiting.  Reports weight fluctuations.  Denied alcohol  or NSAID use.  Also GERD symptoms but no dysphagia.  She was hyponatremic with sodium 131 normal hemoglobin at 12.1.  CT during this hospitalization with minimal perisigmoid haziness with chronic versus mild acute diverticulitis and no abscess or perforation.  She was having improvement in her abdominal pain with IV antibiotics and did not have any leukocytosis and given her hemoglobin was stable this bleeding was felt to be secondary to diverticulitis therefore the decision was made to hold off on inpatient endoscopies advised to follow-up with her primary physician with [facility].   Last office visit 12/07/23 with Dr. Shaaron.  Seen after prior hospitalization.  Reports diarrhea had calm down she was not having any nausea vomiting or dysphagia  and her abdominal pain had been improved.  She had recently noted that her blood sugars have been in the 300 range as well.  She stated she had previously supposed to have been on home O2 but insurance would not pay for it.  He recommended seeing cardiology and plan for outpatient EGD and colonoscopy. Obtain labs (CBC and CMP)   Represented to the hospital 01/19/24 with rectal bleeding and worsening anemia.  hemoglobin 7.6, sodium 123, stool heme positive.  C. difficile was negative.  CT A/P with contrast with air-fluid levels within the colon, similar infiltration of the subcutaneous soft tissue at the ventral abdominal pannus, cellulitis not excluded, chronic interstitial changes of the visualized lung bases with similar patchy ground glass nodular densities, concerning for multifocal infection better evaluated on recent chest CT.  She had reported acute onset diarrhea prior to presenting to the hospital and was unable to quantify the amount of bowel movements.  Had recently completed 7-day day course of antibiotics for UTI.  Also reported ongoing issues with early satiety.  Once her sodium improved she was scheduled for colonoscopy and upper endoscopy which was performed which was performed as noted below.  After procedure she was no longer having diarrhea, abdominal pain, nausea, or vomiting and was tolerating diet.  Also no more episodes of melena or hematochezia.  She was encouraged to avoid any NSAIDs and continue with PPI twice daily and consider repeat EGD in 3 months.   EGD 01/22/24: - Normal esophagus.  - Non-bleeding gastric ulcers with a clean ulcer base (Forrest Class III). Biopsied.  - Non-bleeding duodenal diverticulum.  - Use  pantoprazole  40 mg twice daily - Avoid NSAIDs - Path negative for H. pylori.  Superficial ulceration and surface erosion present.   Colonoscopy 01/22/2024: - Two 3 to 4 mm polyps in the transverse colon (sessile serrated polyp without dysplasia - Multiple  non-bleeding colonic angiodysplastic lesions s/p APC - Diverticulosis in the sigmoid colon.  - The distal rectum and anal verge are normal on retroflexion view.  - Repeat colonoscopy in 5 years    Last office visit 02/09/24.  Experiencing abdominal incision for 2 weeks with asymmetry noted to 1 side of her abdomen versus the other.  Distention does not improve even after bowel movements or influenced by food intake.  Pain/cramping occurs with bowel movements but does not persist afterwards.  Notes history of chronic constipation with bowel movements that require significant straining.  Reports Bristol 4 stools, takes Senokot for pills at a time once or twice a week.  No MiraLAX  at the time.  Denied any current bleeding but has a history of rectal bleeding given angiodysplastic lesions and hemorrhoids.  Taking pantoprazole  twice daily for her stomach ulcers during prior EGD.  This controls reflux well.  Reporting fluid retention especially in her belly.  Also having some shortness of breath and insomnia.  Advised colonoscopy in 5 years health permitting, continue pantoprazole  twice daily and consider repeat upper endoscopy in November.  Continue B12 supplementation.  Advised to follow-up with PCP in regards to anasarca.  Ordered celiac labs for completeness sake in regards to her constipation and bloating.  Start MiraLAX  daily for bowel regularity  Received clearance from cardiology to hold Eliquis  for 2 days prior on 9/11 in order to proceed with scheduling repeat upper endoscopy in November.  Labs in September with negative celiac panel.  Admitted to [facility] 02/15/2024 for concern for lower GI bleed, worsening abdominal pain, bloating, abdominal distention and diarrhea.  Hemoglobin noted to be 8, was 7.2 a week prior.  Main concern was diarrhea, about 12 watery stools daily without significant odor.  Also having left lower quadrant and right lower quadrant abdominal pain which is intermittent in nature.  Did  not NSAIDs, was taking PPI twice daily.  Noted to be on antibiotics in August for 3-4 weeks for UTI.  Reported 10-15 pound weight gain.  Stool studies ordered.  CT abdomen pelvis without IV contrast ordered.  Advised PPI twice daily for peptic ulcer disease and continue to hold Eliquis .  Recommend updating echocardiogram and consider potential inpatient procedures.  CT revealed a focal wall thickening of the descending sigmoid colon most likely from nondistention rather than inflammation, small ascites in the paracolic gutter, subcutaneous emphysema and concern for possible panniculitis.  Healing 5th and 6th rib fractures.  On 9/17 reported no need for repeat EGD or colonoscopy and advise continue PPI twice daily and track her H&H.  Consider IV iron if not responding to p.o. iron.  She was seen again on 9/22 noting again right sided intermittent abdominal pain but not postprandial.  No gallstones on CT scan.  Update HFP and to complete ultrasound if increased LFTs.  Had some recurrent rectal bleeding as well therefore recommended EGD and colonoscopy.  She underwent EGD and colonoscopy on 02/23/2024.  EGD with normal esophagus, erosive gastropathy without recent bleeding s/p biopsy and normal duodenum s/p biopsy.  On colonoscopy she was found to have hemorrhoids on perianal exam and colon completely normal s/p random biopsies.  Multiple nonbleeding colonic angiodysplastic lesions treated with APC therapy and was noted to have  nonbleeding internal and external hemorrhoids.  Biopsies revealed reactive gastropathy, normal duodenal biopsies, and negative colonic biopsies for microscopic colitis. Advised Anusol  cream twice daily to the hemorrhoid area for a week and continue PPI twice daily.  Repeat colonoscopy in 2 months for retreatment advised.  Attendings suspected that abdominal pain and right-sided chest pain most likely MSK in nature.  Advised to try lidocaine  patches for the right sided  pain.  Today:  Discussed the use of AI scribe software for clinical note transcription with the patient, who gave verbal consent to proceed.  Her energy levels are improving but are not yet at her desired level. She experiences frequent bowel movements that are completely black, which she attributes to iron supplementation. She experiences some straining and rates her stool consistency as a 'four' on the [facility] Stool Chart. She uses Senokot, taking four tablets at once, and occasionally uses a suppository, which she uses more frequently than Senokot. She previously tried Linzess  but experienced excessive loose stools and frequent bowel movements.  She has been using Anusol  cream twice daily for hemorrhoids since her hospital discharge, which has helped reduce bleeding. She has a history of angiodysplastic lesions in the colon, which have previously caused bleeding. We reviewed her upper endoscopy and stated that it showed a healed stomach ulcer and some abnormal-appearing tissue (gastropathy), but that biopsies were normal. She is currently on Eliquis  and reports no recurrent bleeding. No nausea from iron supplementation.  She was on a liquid diet about a month ago, which led her to stop eating fibrous vegetables like cabbage, squash, and beets. She is slowly reintroducing these foods to help with bowel movements. Her labs improved by hospital discharge. No significant abdominal pain.   She is currently taking pantoprazole  (Protonix ) twice daily and recently refilled her prescription. She experiences itching and stretch marks, particularly on her legs and arms, which she attributes to fluid retention from her recent hospitalization. She reports anxiety attacks and is short on Xanax , which she takes in small doses. She experiences shaking in her legs and arms during these attacks.  She does not drive and has been slowly increasing her physical activity post-hospitalization.      Wt Readings from  Last 5 Encounters:  03/08/24 165 lb 6.4 oz (75 kg)  03/07/24 165 lb 6 oz (75 kg)  03/02/24 172 lb (78 kg)  02/26/24 168 lb (76.2 kg)  02/25/24 168 lb 3.4 oz (76.3 kg)    Current Outpatient Medications  Medication Sig Dispense Refill   acetaminophen  (TYLENOL ) 325 MG tablet Take 2 tablets (650 mg total) by mouth every 6 (six) hours as needed for mild pain (pain score 1-3) or fever (or Fever >/= 101).     ALPRAZolam  (XANAX ) 1 MG tablet Take 1 mg by mouth at bedtime as needed for sleep.     ARIPiprazole  (ABILIFY ) 2 MG tablet Take 1 tablet (2 mg total) by mouth at bedtime. 30 tablet 5   atorvastatin  (LIPITOR) 80 MG tablet Take 1 tablet (80 mg total) by mouth daily. 90 tablet 3   carbidopa -levodopa  (SINEMET ) 10-100 MG tablet Take 1 tablet by mouth 4 (four) times daily. For restless leg syndrome 120 tablet 5   colchicine  0.6 MG tablet Take 1 tablet (0.6 mg total) by mouth every 12 (twelve) hours as needed (gout flare.). For mouth sores     Continuous Glucose Sensor (DEXCOM G7 SENSOR) MISC CHANGE SENSOR EVERY 10 DAYS 9 each 3   cyanocobalamin  (VITAMIN B12) 1000 MCG tablet Take  1 tablet (1,000 mcg total) by mouth daily. 30 tablet 1   diphenoxylate -atropine  (LOMOTIL ) 2.5-0.025 MG tablet TAKE 2 TABLETS BY MOUTH FOUR TIMES DAILY AS NEEDED FOR DIARRHEA OR LOOSE STOOLS 30 tablet 5   ELIQUIS  5 MG TABS tablet TAKE 1 TABLET TWICE A DAY 180 tablet 3   escitalopram  (LEXAPRO ) 10 MG tablet Take 10 mg by mouth daily.     ferrous sulfate  325 (65 FE) MG EC tablet Take 1 tablet (325 mg total) by mouth 2 (two) times daily. 60 tablet 3   furosemide  (LASIX ) 40 MG tablet Take 1 tablet (40 mg total) by mouth 2 (two) times daily. 180 tablet 3   gabapentin  (NEURONTIN ) 100 MG capsule Take 1 capsule (100 mg total) by mouth 3 (three) times daily. 90 capsule 3   hydrocortisone  (ANUSOL -HC) 2.5 % rectal cream Place rectally 2 (two) times daily. 30 g 0   hydrOXYzine  (VISTARIL ) 25 MG capsule Take 1 capsule (25 mg total) by mouth  every 8 (eight) hours as needed for anxiety. 30 capsule 5   insulin  glargine (LANTUS  SOLOSTAR) 100 UNIT/ML Solostar Pen Inject 10 Units into the skin at bedtime. 9 mL 0   insulin  lispro (HUMALOG  KWIKPEN) 100 UNIT/ML KwikPen Inject 0-10 Units into the skin 2 (two) times daily before a meal. Sliding scale Short acting Humalog  insulin  per sliding scale 0-10 ---:-- Insulin  injection 0-10 Units 0-10 Units Subcutaneous, 3 times daily with meals CBG 70 - 120: 0 unit CBG 121 - 150: 0 unit  CBG 151 - 200: 1 unit CBG 201 - 250: 2 units CBG 251 - 300: 4 units CBG 301 - 350: 6 units  CBG 351 - 400: 8 units  CBG > 400: 10 units 18 mL 1   Insulin  Pen Needle 29G X MISC 1 Device by Does not apply route daily in the afternoon. 400 each 3   lidocaine  (LIDODERM ) 5 % Place 1 patch onto the skin daily. Remove & Discard patch within 12 hours or as directed by MD 30 patch 0   losartan  (COZAAR ) 25 MG tablet Take 25 mg by mouth daily.     MAGNESIUM  PO Take 1 tablet by mouth daily.     Methocarbamol  1000 MG TABS Take 1,000 mg by mouth every 6 (six) hours as needed. 120 tablet 5   metoprolol  tartrate (LOPRESSOR ) 50 MG tablet Take 1 tablet (50 mg total) by mouth 2 (two) times daily. 180 tablet 3   Misc. Devices (3-IN-1 BEDSIDE TOILET) MISC Length of need 99 months M17.11 Osteoarthritis right knee / multiple falls / high risk fall 1 each 1   ondansetron  (ZOFRAN ) 4 MG tablet Take 1 tablet (4 mg total) by mouth every 8 (eight) hours as needed for nausea or vomiting. For cancer related nausea 30 tablet 1   OXcarbazepine  (TRILEPTAL ) 150 MG tablet Take 1 tablet (150 mg total) by mouth 2 (two) times daily. 60 tablet 0   oxyCODONE  (OXYCONTIN ) 20 mg 12 hr tablet Take 1 tablet (20 mg total) by mouth every 12 (twelve) hours. G89.3- chronic pain- fill 28 days after 8/25- Rx 60 tablet 0   pantoprazole  (PROTONIX ) 40 MG tablet Take 1 tablet (40 mg total) by mouth 2 (two) times daily. 60 tablet 5   potassium chloride  (KLOR-CON ) 10 MEQ  tablet Take 1 tablet (10 mEq total) by mouth daily. 90 tablet 1   rOPINIRole  (REQUIP ) 2 MG tablet Take 1 tablet (2 mg total) by mouth at bedtime. 90 tablet 0   spironolactone  (  ALDACTONE ) 25 MG tablet Take 0.5 tablets (12.5 mg total) by mouth daily. 45 tablet 3   tirzepatide  (MOUNJARO ) 12.5 MG/0.5ML Pen Inject 12.5 mg into the skin once a week. 6 mL 3   vitamin C  (VITAMIN C ) 500 MG tablet Take 0.5 tablets (250 mg total) by mouth 2 (two) times daily. 60 tablet 2   Vitamin D , Cholecalciferol , 25 MCG (1000 UT) TABS Take 1,000 Units by mouth in the morning.     Current Facility-Administered Medications  Medication Dose Route Frequency Provider Last Rate Last Admin   denosumab  (PROLIA ) injection 60 mg  60 mg Subcutaneous Q6 months [name], MD   60 mg at 01/15/24 1459   Facility-Administered Medications Ordered in Other Visits  Medication Dose Route Frequency Provider Last Rate Last Admin   bupivacaine -epinephrine  (MARCAINE  W/ EPI) 0.5% -1:200000 injection    Anesthesia Intra-op Tilford Franky BIRCH, MD   12 mL at 01/21/19 0935    Past Medical History:  Diagnosis Date   Allergic rhinitis 02/24/2019   Ambulatory dysfunction 04/23/2020   Atrial fibrillation with RVR (HCC) 05/19/2017   Back pain/sacroiliitis--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   Benign paroxysmal positional vertigo due to bilateral vestibular disorder 05/20/2019   Bipolar 1 disorder (HCC) 01/23/2015   with GAD, benzo dependence   CAD (coronary artery disease)    Nonobstructive; Managed by Dr. Charls   Cardiomegaly 01/12/2018   CHF (congestive heart failure) 06/08/2022   Chronic back pain 01/04/2015   Chronic constipation 04/25/2020   Chronic diastolic heart failure (HCC) 05/30/2015   Chronic pain syndrome 08/22/2019   back pain, sacroiliitis   Chronic post-traumatic stress disorder (PTSD) 12/06/2020   Diabetic  neuropathy (HCC) 02/06/2016   Dyslipidemia 09/24/2020   Essential hypertension    Functional diarrhea 10/26/2020   Herpes genitalis in women 07/16/2015   History of adenomatous polyp of colon 05/21/2019   Overview:   03/31/17: Colonoscopy: nonadvanced adenoma, microscopic colitis, f/u 5 yrs, Murphy/GAP   History of radiation therapy    Endometrial- 02/18/23-03/31/23-Dr. Lynwood Kinard   Hypotension 09/01/2022   Insomnia 01/23/2015   Metabolic acidosis 01/12/2023   Migraine headache with aura 02/12/2016   Myofascial pain dysfunction syndrome 08/22/2019   Non-alcoholic fatty liver disease 01/12/2018   OSA (obstructive sleep apnea) 02/24/2019   10/09/2018 - HST  - AHI 40.6    Osteopenia 12/06/2020   Rx alendronate  35 mg.   Pinguecula 12/06/2020   Presbyopia 12/06/2020   Pulmonary hypertension    RLS (restless legs syndrome) 04/27/2015   RSV (respiratory syncytial virus infection) 06/10/2023   Tremor, essential 12/11/2021   Type II diabetes mellitus (HCC) 09/24/2020    Past Surgical History:  Procedure Laterality Date   BREAST REDUCTION SURGERY     COLONOSCOPY  2018   6 mm cecal tubular adenoma, sigmoid diverticulosis, random colon biopsies consistent with microscopic colitis   COLONOSCOPY N/A 01/22/2024   Procedure: COLONOSCOPY;  Surgeon: Eartha Angelia Sieving, MD;  Location: [facility];  Service: Gastroenterology;  Laterality: N/A;   COLONOSCOPY N/A 02/23/2024   Procedure: COLONOSCOPY;  Surgeon: Eartha Angelia Sieving, MD;  Location: [facility];  Service: Gastroenterology;  Laterality: N/A;   ESOPHAGOGASTRODUODENOSCOPY N/A 01/22/2024   Procedure: EGD (ESOPHAGOGASTRODUODENOSCOPY);  Surgeon: Eartha Angelia, Sieving, MD;  Location: [facility];  Service: Gastroenterology;  Laterality: N/A;   ESOPHAGOGASTRODUODENOSCOPY N/A 02/23/2024   Procedure: EGD (ESOPHAGOGASTRODUODENOSCOPY);  Surgeon: Eartha Angelia, Sieving,  MD;  Location: [facility];  Service: Gastroenterology;   Laterality: N/A;   EYE SURGERY Right    cateracts   HAMMER TOE SURGERY     LEFT HEART CATH AND CORONARY ANGIOGRAPHY N/A 02/03/2018   Procedure: LEFT HEART CATH AND CORONARY ANGIOGRAPHY;  Surgeon: [name], Peter M, MD;  Location: [facility] INVAS[facility] CV [facility];  Service: Cardiovascular;  Laterality: N/A;   REVERSE SHOULDER ARTHROPLASTY Right 08/19/2019   Procedure: REVERSE SHOULDER ARTHROPLASTY;  Surgeon: Kay Kemps, MD;  Location: [facility];  Service: Orthopedics;  Laterality: Right;  interscalene block   SHOULDER SURGERY Right    I BROKE MY SHOUDLER   THIGH SURGERY     TO REMOVE A TUMOR     Family History  Problem Relation Age of Onset   Diabetes Mother    Heart disease Mother    Alzheimer's disease Father    Heart disease Sister        CABG   Diabetes Sister    Alcohol  abuse Sister    Stroke Brother    Heart disease Brother    Mental illness Brother    Diabetes Brother    Breast cancer Neg Hx    Ovarian cancer Neg Hx    Colon cancer Neg Hx    Endometrial cancer Neg Hx     Allergies as of 03/08/2024 - Review Complete 03/08/2024  Allergen Reaction Noted   Iodinated contrast media Anaphylaxis, Swelling, and Other (See Comments) 12/13/2014   Iodine Anaphylaxis 06/28/2003   Latex Other (See Comments) 09/29/2018   Tape Other (See Comments) 01/28/2023   Tizanidine  Other (See Comments) 03/10/2022    Social History   Socioeconomic History   Marital status: Married    Spouse name: Financial planner   Number of children: 3   Years of education: 15   Highest education level: Associate degree: academic program  Occupational History   Occupation: Retired    Comment: Chief Financial Officer  Tobacco Use   Smoking status: Never   Smokeless tobacco: Never  Vaping Use   Vaping status: Never Used  Substance and Sexual Activity   Alcohol  use: Not Currently    Alcohol /week: 0.0 standard drinks of alcohol    Drug use: No   Sexual activity: Not Currently    Partners: Male    Birth control/protection:  Post-menopausal  Other Topics Concern   Not on file  Social History Narrative   Lives at home with husband.    They have 3 children who live away - [place] , [place] , and [place] .   She is from [place]  and most of her family lives there.   Her husbands's family live nearby   Right handed.   Social Drivers of Corporate investment banker Strain: Low Risk  (08/13/2023)   Overall Financial Resource Strain (CARDIA)    Difficulty of Paying Living Expenses: Not hard at all  Food Insecurity: No Food Insecurity (02/15/2024)   Hunger Vital Sign    Worried About Running Out of Food in the Last Year: Never true    Ran Out of Food in the Last Year: Never true  Transportation Needs: No Transportation Needs (02/15/2024)   PRAPARE - Administrator, Civil Service (Medical): No    Lack of Transportation (Non-Medical): No  Physical Activity: Insufficiently Active (08/13/2023)   Exercise Vital Sign    Days of Exercise per Week: 3 days    Minutes of Exercise per Session: 20 min  Stress: No Stress Concern Present (08/13/2023)   [employer] of Occupational  Health - Occupational Stress Questionnaire    Feeling of Stress : Only a little  Social Connections: Socially Integrated (02/15/2024)   Social Connection and Isolation Panel    Frequency of Communication with Friends and Family: More than three times a week    Frequency of Social Gatherings with Friends and Family: Once a week    Attends Religious Services: More than 4 times per year    Active Member of [employer] or Organizations: Yes    Attends Engineer, structural: More than 4 times per year    Marital Status: Married     Review of Systems   Gen: + weakness and fatigue. Denies fever, chills, anorexia. Denies weight loss.  CV: Denies chest pain, palpitations, syncope, peripheral edema, and claudication. Resp: + mild DOE. Denies dyspnea at rest, cough, wheezing, coughing up blood, and pleurisy. GI: See HPI Derm: Denies  rash, itching, dry skin Psych: + anxiety. Denies depression, memory loss, confusion. No homicidal or suicidal ideation.  Heme: Denies bruising, bleeding, and enlarged lymph nodes.  Physical Exam   BP 116/76 (BP Location: Right Arm, Patient Position: Sitting, Cuff Size: Normal)   Pulse 76   Temp 97.7 F (36.5 C) (Temporal)   Ht 5' 2 (1.575 m)   Wt 165 lb 6.4 oz (75 kg)   BMI 30.25 kg/m   General:   Alert and oriented. No distress noted. Pleasant and cooperative.  Head:  Normocephalic and atraumatic. Eyes:  Conjuctiva clear without scleral icterus. Mouth:  Oral mucosa pink and moist. Good dentition. No lesions. Abdomen:  +BS, soft, mildly distended. No ttp. No rebound or guarding. UTA for HSM. Rectal: deferred Msk:  Symmetrical without gross deformities. Normal posture. Neurologic:  Alert and  oriented x4 Psych:  Alert and cooperative. Normal mood and affect.  Assessment  Amber Stephenson is a 78 y.o. female presenting today for hospital follow-up.     Constipation Chronic constipation with solid bowel movements. Managed with Senokot and suppositories. Previous trial of Linzess  resulted in excessive loose stools. Prefers dietary management with fibrous foods like cabbage, squash, and beets, which she is curious if she can resume given she was on clear liquid diet for quite some time and has slowly been increasing her foods.  - Continue Senokot daily and suppositories as needed - Encourage dietary intake of fibrous foods to aid bowel movements  Hemorrhoids Chronic hemorrhoids with bleeding, managed with Anusol  cream twice daily. Bleeding has improved with current treatment. - Decrease Anusol  cream to once daily as symptoms improve and eventually reduce to as needed  Angiodysplasia of colon Angiodysplastic lesions in the colon potentially contributing to anemia, 5 treated with APC therapy. Plan to repeat colonoscopy in late November or early December to assess for new lesions. -  Schedule colonoscopy for late November or early December   Proceed with colonoscopy with propofol  by Dr. Shaaron in near future: the risks, benefits, and alternatives have been discussed with the patient in detail. The patient states understanding and desires to proceed. ASA 3  Cardiac clearance to hold Eliquis  for 2 days prior  Hold Lantus  night prior to procedure, no insulin  day of  Hold iron for 1 week prior  Hold Mounjaro  for 1 week prior  Iron deficiency anemia secondary to chronic blood loss Iron deficiency anemia secondary to chronic blood loss from angiodysplastic lesions. Currently on iron supplementation without nausea. EGD up to date. Colonoscopy as above in HPI. - Follow up colonoscopy as above.   Reactive gastropathy Mild  gastric gastropathy observed on endoscopy with abnormal appearing blood vessels and tissue. Biopsies returned reactive gastropathy and no evidence of active ulceration. - Continue pantoprazole  (Protonix ) 40 mg twice daily - Potential to reduce pantoprazole  dosage after follow-up colonoscopy  Pruritus Pruritus primarily on legs and arms, possibly related to anxiety. Current management with Xanax  is limited due to prescription constraints. Discussed potential use of hydroxyzine  or Atarax  for itching and anxiety if Xanax  is not continued. - Consult with prescribing physician regarding continuation of Xanax  - Consider prescribing hydroxyzine  or Atarax  if Xanax  is not continued and no drug interactions are present    Follow up   Follow up 2-3 months post colonoscopy.     Charmaine Melia, MSN, FNP-BC, AGACNP-BC [facility]

## 2024-03-08 NOTE — Telephone Encounter (Signed)
  Request for patient to stop medication prior to procedure or is needing cleareance  03/08/24  Amber Stephenson 1945-12-31  What type of surgery is being performed? Colonoscopy  When is surgery scheduled? TBD  What type of clearance is required (medical or pharmacy to hold medication or both? medication  Are there any medications that need to be held prior to surgery and how long? Eliquis  x 2 days   Name of physician performing surgery?  DR.Sh[facility]: [phone], option 5 Fax: [phone]  Anesthesia type (none, local, MAC, general)? MAC   ? Yes ? No Patient can hold medication as requested   Signature: ___________________________

## 2024-03-08 NOTE — Telephone Encounter (Signed)
 Copied from CRM [id number]. Topic: Clinical - Lab/Test Results >> Mar 08, 2024  8:09 AM Emylou G wrote: Reason for CRM: Please call patient.SABRA looking for xray results

## 2024-03-09 ENCOUNTER — Telehealth: Payer: Self-pay

## 2024-03-09 ENCOUNTER — Encounter: Attending: Physical Medicine and Rehabilitation | Admitting: Registered Nurse

## 2024-03-09 ENCOUNTER — Telehealth: Payer: Self-pay | Admitting: Registered Nurse

## 2024-03-09 ENCOUNTER — Encounter: Payer: Self-pay | Admitting: Registered Nurse

## 2024-03-09 VITALS — BP 130/80 | HR 73 | Ht 62.0 in | Wt 162.0 lb

## 2024-03-09 DIAGNOSIS — R400 Somnolence: Secondary | ICD-10-CM

## 2024-03-09 DIAGNOSIS — G894 Chronic pain syndrome: Secondary | ICD-10-CM | POA: Diagnosis not present

## 2024-03-09 DIAGNOSIS — G8929 Other chronic pain: Secondary | ICD-10-CM

## 2024-03-09 DIAGNOSIS — M545 Low back pain, unspecified: Secondary | ICD-10-CM | POA: Diagnosis not present

## 2024-03-09 DIAGNOSIS — M25551 Pain in right hip: Secondary | ICD-10-CM | POA: Insufficient documentation

## 2024-03-09 DIAGNOSIS — Z5181 Encounter for therapeutic drug level monitoring: Secondary | ICD-10-CM | POA: Insufficient documentation

## 2024-03-09 DIAGNOSIS — M79671 Pain in right foot: Secondary | ICD-10-CM | POA: Insufficient documentation

## 2024-03-09 DIAGNOSIS — M79672 Pain in left foot: Secondary | ICD-10-CM | POA: Insufficient documentation

## 2024-03-09 DIAGNOSIS — Z79891 Long term (current) use of opiate analgesic: Secondary | ICD-10-CM | POA: Insufficient documentation

## 2024-03-09 DIAGNOSIS — M25561 Pain in right knee: Secondary | ICD-10-CM | POA: Insufficient documentation

## 2024-03-09 DIAGNOSIS — W19XXXD Unspecified fall, subsequent encounter: Secondary | ICD-10-CM

## 2024-03-09 MED ORDER — OXYCODONE HCL ER 20 MG PO T12A: 20.0000 mg | EXTENDED_RELEASE_TABLET | Freq: Two times a day (BID) | ORAL | 0 refills | Status: DC

## 2024-03-09 MED ORDER — OXYCODONE HCL ER 20 MG PO T12A
20.0000 mg | EXTENDED_RELEASE_TABLET | Freq: Two times a day (BID) | ORAL | 0 refills | Status: AC
Start: 2024-03-09 — End: ?

## 2024-03-09 NOTE — Telephone Encounter (Signed)
 Still waiting on results. Will call patient with results when we received them and provider has had a chance to review them and document notes.

## 2024-03-09 NOTE — Telephone Encounter (Signed)
 Dr Cornelio,  [name] seen Dr Jolinda on 03/07/2024, see her note  I seen [name] today, they wanted to see you regarding what was discussed with Dr Jolinda. She was add her to your Friday schedule, not sure if you want to review the note and given them a call. Just wanted you to be aware why she was on your schedule.  Thanks

## 2024-03-09 NOTE — Telephone Encounter (Signed)
 Copied from CRM [id number]. Topic: Clinical - Lab/Test Results >> Mar 09, 2024 10:07 AM Rosaria BRAVO wrote: Reason for CRM: Pt called for Xray results, please advise.   Best contact: [phone]

## 2024-03-09 NOTE — Progress Notes (Signed)
 Subjective:    Patient ID: Amber Stephenson, female    DOB: [birth year], 78 y.o.   MRN: [id number]  HPI: Amber Stephenson is a 78 y.o. female who returns for follow up appointment for chronic pain and medication refill. She states her pain is located in her lower back, right hip, right knee and bilateral feet pain R>L. She rates her pain 7. Her current exercise regime is walking.   Amber Stephenson reports a week ago she was walking in a store and she tripped over a rug and began to fall on her left side, she landed against her friend, who helped her lower to the floor her husband stated. She didn't seek medical attention. She wasn't using any assistive devices. She was educated on falls prevention, and instructed to use her cane or walker at all times, she verbalizes understanding.   Amber Stephenson seen Dr Jolinda on 03/07/2024, note was reviewed, she will be seen by Dr Cornelio on 03/11/2024 to address Dr Marilou concerns. Her husband mention Fibromyalgia Dr Cornelio notes were reviewed on 09/22/2022 Fibromyalgia is on her diagnosis  and she is currently on Gabapentin . They would like to see Dr Cornelio and she has been scheduled to see Dr Cornelio on 03/11/2024, they verbalize understanding. ,   Amber Stephenson , noted to be drowsy during the visit, she states she didn't sleep well last night. She denies any distress and refuses ED or Urgent care evaluation.    Amber Stephenson Morphine  equivalent is 60.00 MME.   Oral Swab was Performed today.   Pain Inventory Average Pain 7 Pain Right Now 7 My pain is sharp and aching  In the last 24 hours, has pain interfered with the following? General activity 7 Relation with others 7 Enjoyment of life 7 What TIME of day is your pain at its worst? varies Sleep (in general) Poor  Pain is worse with: walking, bending, and some activites Pain improves with: rest, heat/ice, and medication Relief from Meds: 3  Family History  Problem Relation Age of Onset    Diabetes Mother    Heart disease Mother    Alzheimer's disease Father    Heart disease Sister        CABG   Diabetes Sister    Alcohol  abuse Sister    Stroke Brother    Heart disease Brother    Mental illness Brother    Diabetes Brother    Breast cancer Neg Hx    Ovarian cancer Neg Hx    Colon cancer Neg Hx    Endometrial cancer Neg Hx    Social History   Socioeconomic History   Marital status: Married    Spouse name: Financial planner   Number of children: 3   Years of education: 15   Highest education level: Associate degree: academic program  Occupational History   Occupation: Retired    Comment: Chief Financial Officer  Tobacco Use   Smoking status: Never   Smokeless tobacco: Never  Vaping Use   Vaping status: Never Used  Substance and Sexual Activity   Alcohol  use: Not Currently    Alcohol /week: 0.0 standard drinks of alcohol    Drug use: No   Sexual activity: Not Currently    Partners: Male    Birth control/protection: Post-menopausal  Other Topics Concern   Not on file  Social History Narrative   Lives at home with husband.    They have 3 children who live away - [place] , [place] , and [place] .   She is from  [place]  and most of her family lives there.   Her husbands's family live nearby   Right handed.   Social Drivers of Corporate investment banker Strain: Low Risk  (08/13/2023)   Overall Financial Resource Strain (CARDIA)    Difficulty of Paying Living Expenses: Not hard at all  Food Insecurity: No Food Insecurity (02/15/2024)   Hunger Vital Sign    Worried About Running Out of Food in the Last Year: Never true    Ran Out of Food in the Last Year: Never true  Transportation Needs: No Transportation Needs (02/15/2024)   PRAPARE - Administrator, Civil Service (Medical): No    Lack of Transportation (Non-Medical): No  Physical Activity: Insufficiently Active (08/13/2023)   Exercise Vital Sign    Days of Exercise per Week: 3 days    Minutes of Exercise per  Session: 20 min  Stress: No Stress Concern Present (08/13/2023)   [employer] of Occupational Health - Occupational Stress Questionnaire    Feeling of Stress : Only a little  Social Connections: Socially Integrated (02/15/2024)   Social Connection and Isolation Panel    Frequency of Communication with Friends and Family: More than three times a week    Frequency of Social Gatherings with Friends and Family: Once a week    Attends Religious Services: More than 4 times per year    Active Member of Clubs or Organizations: Yes    Attends Engineer, structural: More than 4 times per year    Marital Status: Married   Past Surgical History:  Procedure Laterality Date   BREAST REDUCTION SURGERY     COLONOSCOPY  2018   6 mm cecal tubular adenoma, sigmoid diverticulosis, random colon biopsies consistent with microscopic colitis   COLONOSCOPY N/A 01/22/2024   Procedure: COLONOSCOPY;  Surgeon: Eartha Angelia Sieving, MD;  [facility];  Service: Gastroenterology;  Laterality: N/A;   COLONOSCOPY N/A 02/23/2024   Procedure: COLONOSCOPY;  Surgeon: Eartha Angelia Sieving, MD;  [facility];  Service: Gastroenterology;  Laterality: N/A;   ESOPHAGOGASTRODUODENOSCOPY N/A 01/22/2024   Procedure: EGD (ESOPHAGOGASTRODUODENOSCOPY);  Surgeon: Eartha Angelia, Sieving, MD;  [facility];  Service: Gastroenterology;  Laterality: N/A;   ESOPHAGOGASTRODUODENOSCOPY N/A 02/23/2024   Procedure: EGD (ESOPHAGOGASTRODUODENOSCOPY);  Surgeon: Eartha Angelia, Sieving, MD;  [facility];  Service: Gastroenterology;  Laterality: N/A;   EYE SURGERY Right    cateracts   HAMMER TOE SURGERY     LEFT HEART CATH AND CORONARY ANGIOGRAPHY N/A 02/03/2018   Procedure: LEFT HEART CATH AND CORONARY ANGIOGRAPHY;  Surgeon: [name], Peter M, MD;  Location: [facility]VAS[facility] CV LAB;  Service: Cardiovascular;  Laterality: N/A;   REVERSE SHOULDER ARTHROPLASTY Right 08/19/2019    Procedure: REVERSE SHOULDER ARTHROPLASTY;  Surgeon: Kay Kemps, MD;  Location: [facility];  Service: Orthopedics;  Laterality: Right;  interscalene block   SHOULDER SURGERY Right    I BROKE MY SHOUDLER   THIGH SURGERY     TO REMOVE A TUMOR    Past Surgical History:  Procedure Laterality Date   BREAST REDUCTION SURGERY     COLONOSCOPY  2018   6 mm cecal tubular adenoma, sigmoid diverticulosis, random colon biopsies consistent with microscopic colitis   COLONOSCOPY N/A 01/22/2024   Procedure: COLONOSCOPY;  Surgeon: Eartha Angelia Sieving, MD;  [facility];  Service: Gastroenterology;  Laterality: N/A;   COLONOSCOPY N/A 02/23/2024   Procedure: COLONOSCOPY;  Surgeon: Eartha Angelia Sieving, MD;  [facility];  Service: Gastroenterology;  Laterality: N/A;   ESOPHAGOGASTRODUODENOSCOPY N/A 01/22/2024   Procedure: EGD (ESOPHAGOGASTRODUODENOSCOPY);  Surgeon: Eartha Flavors, Toribio, MD;  [facility];  Service: Gastroenterology;  Laterality: N/A;   ESOPHAGOGASTRODUODENOSCOPY N/A 02/23/2024   Procedure: EGD (ESOPHAGOGASTRODUODENOSCOPY);  Surgeon: Eartha Flavors, Toribio, MD;  [facility];  Service: Gastroenterology;  Laterality: N/A;   EYE SURGERY Right    cateracts   HAMMER TOE SURGERY     LEFT HEART CATH AND CORONARY ANGIOGRAPHY N/A 02/03/2018   Procedure: LEFT HEART CATH AND CORONARY ANGIOGRAPHY;  Surgeon: [name], Peter M, MD;  Location: [facility] INVASIVE CV LAB;  Service: Cardiovascular;  Laterality: N/A;   REVERSE SHOULDER ARTHROPLASTY Right 08/19/2019   Procedure: REVERSE SHOULDER ARTHROPLASTY;  Surgeon: Kay Kemps, MD;  Location: [facility];  Service: Orthopedics;  Laterality: Right;  interscalene block   SHOULDER SURGERY Right    I BROKE MY SHOUDLER   THIGH SURGERY     TO REMOVE A TUMOR    Past Medical History:  Diagnosis Date   Allergic rhinitis 02/24/2019   Ambulatory dysfunction 04/23/2020   Atrial fibrillation with RVR (HCC)  05/19/2017   Back pain/sacroiliitis--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   Benign paroxysmal positional vertigo due to bilateral vestibular disorder 05/20/2019   Bipolar 1 disorder (HCC) 01/23/2015   with GAD, benzo dependence   CAD (coronary artery disease)    Nonobstructive; Managed by Dr. Charls   Cardiomegaly 01/12/2018   CHF (congestive heart failure) 06/08/2022   Chronic back pain 01/04/2015   Chronic constipation 04/25/2020   Chronic diastolic heart failure (HCC) 05/30/2015   Chronic pain syndrome 08/22/2019   back pain, sacroiliitis   Chronic post-traumatic stress disorder (PTSD) 12/06/2020   Diabetic neuropathy (HCC) 02/06/2016   Dyslipidemia 09/24/2020   Essential hypertension    Functional diarrhea 10/26/2020   Herpes genitalis in women 07/16/2015   History of adenomatous polyp of colon 05/21/2019   Overview:   03/31/17: Colonoscopy: nonadvanced adenoma, microscopic colitis, f/u 5 yrs, [facility]/GAP   History of radiation therapy    Endometrial- 02/18/23-03/31/23-Dr. Lynwood Kinard   Hypotension 09/01/2022   Insomnia 01/23/2015   Metabolic acidosis 01/12/2023   Migraine headache with aura 02/12/2016   Myofascial pain dysfunction syndrome 08/22/2019   Non-alcoholic fatty liver disease 01/12/2018   OSA (obstructive sleep apnea) 02/24/2019   10/09/2018 - HST  - AHI 40.6    Osteopenia 12/06/2020   Rx alendronate  35 mg.   Pinguecula 12/06/2020   Presbyopia 12/06/2020   Pulmonary hypertension    RLS (restless legs syndrome) 04/27/2015   RSV (respiratory syncytial virus infection) 06/10/2023   Tremor, essential 12/11/2021   Type II diabetes mellitus (HCC) 09/24/2020   BP 130/80   Pulse 73   Ht 5' 2 (1.575 m)   Wt 162 lb (73.5 kg)   SpO2 92%   BMI 29.63 kg/m   Opioid Risk Score:   Fall Risk Score:  `1  Depression screen [facility] 2/9     02/26/2024   11:28 AM  02/15/2024    9:47 AM 02/09/2024    2:46 PM 01/12/2024    9:56 AM 01/08/2024    2:04 PM 01/01/2024    2:37 PM 12/25/2023    9:57 AM  Depression screen PHQ 2/9  Decreased Interest 0 0 0 0 0 0 0  Down, Depressed, Hopeless 0 0  0 0 0 0  PHQ -  2 Score 0 0 0 0 0 0 0  Altered sleeping       0  Tired, decreased energy       0  Change in appetite       0  Feeling bad or failure about yourself        0  Trouble concentrating       0  Moving slowly or fidgety/restless       0  Suicidal thoughts       0  PHQ-9 Score       0  Difficult doing work/chores  Not difficult at all     Not difficult at all     Review of Systems  Musculoskeletal:        Right hip pain Left foot pain  All other systems reviewed and are negative.      Objective:   Physical Exam Vitals and nursing note reviewed.  Constitutional:      Appearance: Normal appearance.  Cardiovascular:     Rate and Rhythm: Normal rate and regular rhythm.     Pulses: Normal pulses.     Heart sounds: Normal heart sounds.  Pulmonary:     Effort: Pulmonary effort is normal.     Breath sounds: Normal breath sounds.  Musculoskeletal:     Comments: Normal Muscle Bulk and Muscle Testing Reveals:  Upper Extremities: Full ROM and Muscle Strength 5/5  Lumbar Paraspinal Tenderness: L-4-L-5 Bilateral Greater Trochanter tenderness: R>L Lower Extremities: Full ROM and Muscle Strength 5/5 Bilateral Lower Extremities Flexion Produces Pain into her Lumbar Arises from table slowly Narrow Based  Gait     Skin:    General: Skin is warm and dry.  Neurological:     Mental Status: She is alert and oriented to person, place, and time.  Psychiatric:        Mood and Affect: Mood normal.        Behavior: Behavior normal.          Assessment & Plan:  Chronic Bilateral Low Back Pain without Sciatica: Continue Current Medication regimen. Continue to Monitor. Continue HEP as Tolerated. 03/09/2024 Fall subsequent encounter : Educated on [employer], instructed to use her walker or cane at all times, she verbalizes understanding. She verbalizes understanding. Continue to Monitor. 03/09/2024 Chronic Pain Syndrome: Refilled: Oxycontin   20 mg every 12 hours #60. Second script sent for the following month.We will continue the opioid monitoring program, this consists of regular clinic visits, examinations, urine drug screen, pill counts as well as use of [place]  Controlled Substance Reporting system. A 12 month History has been reviewed on the [place]  Controlled Substance Reporting System on 03/09/2024 4. Chronic Bilateral Shoulder Pain:No complaints today.  Continue HEP as Tolerated. Continue to monitor. 03/09/2024 5. Chronic Right Hip pain: Continue HEP as Tolerated. Continue current medication regimen. Continue to Monitor.  6. Bilateral feet Pain: PCP ordered Bilateral Foot X-ray: Results Pending  F/U  with Dr. Lovorn

## 2024-03-09 NOTE — Telephone Encounter (Signed)
 Pharmacy please advise on holding Eliquis  X 2 days prior to colonoscopy scheduled for TBD . Last labs (CBC) 02/25/2024; (BMET) 03/02/2024. Thank you.

## 2024-03-10 ENCOUNTER — Other Ambulatory Visit: Payer: Self-pay | Admitting: *Deleted

## 2024-03-10 ENCOUNTER — Telehealth: Payer: Self-pay

## 2024-03-10 ENCOUNTER — Inpatient Hospital Stay: Admitting: Family Medicine

## 2024-03-10 DIAGNOSIS — M79671 Pain in right foot: Secondary | ICD-10-CM | POA: Diagnosis not present

## 2024-03-10 DIAGNOSIS — M7731 Calcaneal spur, right foot: Secondary | ICD-10-CM | POA: Diagnosis not present

## 2024-03-10 DIAGNOSIS — M2042 Other hammer toe(s) (acquired), left foot: Secondary | ICD-10-CM | POA: Diagnosis not present

## 2024-03-10 DIAGNOSIS — G8929 Other chronic pain: Secondary | ICD-10-CM | POA: Diagnosis not present

## 2024-03-10 DIAGNOSIS — M79672 Pain in left foot: Secondary | ICD-10-CM | POA: Diagnosis not present

## 2024-03-10 NOTE — Telephone Encounter (Signed)
 Resent to [facility] to push up read

## 2024-03-10 NOTE — Patient Instructions (Signed)
 Visit Information  Thank you for taking time to visit with me today. Please don't hesitate to contact me if I can be of assistance to you before our next scheduled telephone appointment.  Our next appointment is by telephone on 03/17/24 @ 2 pm  Following is a copy of your care plan:   Goals Addressed             This Visit's Progress    VBCI Transitions of Care (TOC) Care Plan       Problems:  Recent Hospitalization for treatment of CHF No Specialist appointment pt reports she has contact # for GI, will call to schedule Pt lives with spouse, pt is working with [facility] Pt reports she has chronic GI bleeding due to 2 bleeding ulcers, denies any bleeding today Pt has finished antibiotic for UTI Pt declined home health, will be starting outpatient therapy on 03/08/24 Saw primary care provider 10/6,  GI 10/7,  10/8 pain management CBG fasting 112 today, weight 162 pounds  Goal:  Over the next 30 days, the patient will not experience hospital readmission  Interventions:  Heart Failure Interventions: Provided education on low sodium diet Discussed importance of daily weight and advised patient to weigh and record daily Discussed the importance of keeping all appointments with provider Reviewed Heart Failure action plan Reinforced signs/ symptoms UTI, prevention Reinforced signs /symptoms GI bleeding, action plan, call provider for any increase in bleeding, seek emergency assistance as needed Reviewed all upcoming scheduled appointments Encouraged small frequent meals with adequate protein Reviewed carbohydrate modified diet Reviewed correlation of elevated CBG due to infection Reviewed signs /symptoms hypoglycemia, actions to take and importance of eating 3 meals per day and not skipping any meals Pain assessment completed  Patient Self Care Activities:  Attend all scheduled provider appointments Attend church or other social activities Call pharmacy for medication  refills 3-7 days in advance of running out of medications Call provider office for new concerns or questions  Notify RN Care Manager of TOC call rescheduling needs Participate in Transition of Care Program/Attend TOC scheduled calls Take medications as prescribed   call office if I gain more than 2 pounds in one day or 5 pounds in one week keep legs up while sitting watch for swelling in feet, ankles and legs every day weigh myself daily develop a rescue plan follow rescue plan if symptoms flare-up eat more whole grains, fruits and vegetables, lean meats and healthy fats track symptoms and what helps feel better or worse dress right for the weather, hot or cold Eat small, frequent meals, include adequate protein at each meal Please follow carbohydrate modified diet Report any increase in GI bleeding Eat 3 meals per day, do not skip meals Follow RULE OF 15 for low blood sugar management:  How to treat low blood sugars (Blood sugar less than 70 mg/dl  Please follow the RULE OF 15 for the treatment of hypoglycemia treatment (When your blood sugars are less than 70 mg/ dl) STEP  1:  Take 15 grams of carbohydrates when your blood sugar is low, which includes One tube of glucose gel STEP 2:  Recheck blood sugar in 15 minutes STEP 3:  3-4 glucose tabs or  3-4 oz of juice or regular soda or If your blood sugar is still low at the 15 minute recheck ---then, go back to STEP 1 and treat again with another 15 grams of carbohydrates  Plan:  Telephone follow up appointment with care management team  member scheduled for:  03/17/24 @ 2 pm The patient has been provided with contact information for the care management team and has been advised to call with any health related questions or concerns.         Patient verbalizes understanding of instructions and care plan provided today and agrees to view in MyChart. Active MyChart status and patient understanding of how to access instructions and care  plan via MyChart confirmed with patient.     Telephone follow up appointment with care management team member scheduled for:  03/17/24 @ 2 pm  Please call the care guide team at [phone] if you need to cancel or reschedule your appointment.   Please call the Suicide and Crisis Lifeline: 988 call the [place]  National Suicide Prevention Lifeline: 6121542352 or TTY: 330-102-5555 TTY 808-114-9489) to talk to a trained counselor call 1-800-273-TALK (toll free, 24 hour hotline) go to [facility] [address], [place] [phone]) call the [facility] Crisis Line: 5206051176 call 911 if you are experiencing a Mental Health or Behavioral Health Crisis or need someone to talk to.  Mliss Creed [facility], BSN RN Care Manager/ Transition of Care [place]/ [facility] [phone]

## 2024-03-10 NOTE — Patient Outreach (Signed)
 Transition of Care week 3  Visit Note  03/10/2024  Name: Amber Stephenson MRN: [id number]          DOB: 20-Aug-1945  Situation: Patient enrolled in [facility] 30-day program. Visit completed with patient by telephone.   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   Benign paroxysmal positional vertigo due to bilateral vestibular disorder 05/20/2019   Bipolar 1 disorder (HCC) 01/23/2015   with GAD, benzo dependence   CAD (coronary artery disease)    Nonobstructive; Managed by Dr. Charls   Cardiomegaly 01/12/2018   CHF (congestive heart failure) 06/08/2022   Chronic back pain 01/04/2015   Chronic constipation 04/25/2020   Chronic diastolic heart failure (HCC) 05/30/2015   Chronic pain syndrome 08/22/2019   back pain, sacroiliitis   Chronic post-traumatic stress disorder (PTSD) 12/06/2020   Diabetic neuropathy (HCC) 02/06/2016   Dyslipidemia 09/24/2020   Essential hypertension    Functional diarrhea 10/26/2020   Herpes genitalis in women 07/16/2015   History of adenomatous polyp of colon 05/21/2019   Overview:   03/31/17: Colonoscopy: nonadvanced adenoma, microscopic colitis, f/u 5 yrs, [facility]/GAP   History of radiation therapy    Endometrial- 02/18/23-03/31/23-Dr. Lynwood Kinard   Hypotension 09/01/2022   Insomnia 01/23/2015   Metabolic acidosis 01/12/2023   Migraine headache with aura 02/12/2016   Myofascial pain dysfunction syndrome 08/22/2019   Non-alcoholic fatty liver disease 01/12/2018   OSA (obstructive sleep apnea) 02/24/2019   10/09/2018 - HST  - AHI 40.6    Osteopenia 12/06/2020   Rx alendronate  35 mg.   Pinguecula 12/06/2020   Presbyopia 12/06/2020   Pulmonary hypertension    RLS  (restless legs syndrome) 04/27/2015   RSV (respiratory syncytial virus infection) 06/10/2023   Tremor, essential 12/11/2021   Type II diabetes mellitus (HCC) 09/24/2020    Assessment: Patient Reported Symptoms: Cognitive Cognitive Status: No symptoms reported, Alert and oriented to person, place, and time, Normal speech and language skills, Able to follow simple commands      Neurological Neurological Review of Symptoms: No symptoms reported    HEENT HEENT Symptoms Reported: No symptoms reported      Cardiovascular Cardiovascular Symptoms Reported: No symptoms reported Weight: 162 lb (73.5 kg)  Respiratory Respiratory Symptoms Reported: No symptoms reported    Endocrine Endocrine Symptoms Reported: No symptoms reported Is patient diabetic?: Yes Is patient checking blood sugars at home?: Yes List most recent blood sugar readings, include date and time of day: FBS 112 Endocrine Self-Management Outcome: 4 (good) Endocrine Comment: reviewed carbohydrate modified diet  Gastrointestinal Gastrointestinal Symptoms Reported: No symptoms reported Additional Gastrointestinal Details: pt states she has no GI bleeding today      Genitourinary Genitourinary Symptoms Reported: No symptoms reported    Integumentary Integumentary Symptoms Reported: No symptoms reported    Musculoskeletal Musculoskelatal Symptoms Reviewed: No symptoms reported        Psychosocial Psychosocial Symptoms Reported: No symptoms reported         There were no vitals filed for this visit.  Medications Reviewed Today     Reviewed by Aura Mliss LABOR, RN (Registered Nurse) on 03/10/24 at 1456  Med List Status: <None>   Medication Order Taking? Sig Documenting Provider Last Dose Status Informant  acetaminophen  (TYLENOL )  325 MG tablet [id number]  Take 2 tablets (650 mg total) by mouth every 6 (six) hours as needed for mild pain (pain score 1-3) or fever (or Fever >/= 101). Pearlean Manus, MD  Active  Spouse/Significant Other, Pharmacy Records  ALPRAZolam  (XANAX ) 1 MG tablet [id number]  Take 1 mg by mouth at bedtime as needed for sleep. [provider]  Active Spouse/Significant Other, Pharmacy Records  ARIPiprazole  (ABILIFY ) 2 MG tablet [id number]  Take 1 tablet (2 mg total) by mouth at bedtime. Pearlean Manus, MD  Active Spouse/Significant Other, Pharmacy Records  atorvastatin  (LIPITOR) 80 MG tablet [id number]  Take 1 tablet (80 mg total) by mouth daily. Pearlean Manus, MD  Active Spouse/Significant Other, Pharmacy Records  bupivacaine -epinephrine  (MARCAINE  W/ EPI) 0.5% -1:200000 injection [id number]   Hollis, Kevin D, MD  Active   carbidopa -levodopa  (SINEMET ) 10-100 MG tablet [id number]  Take 1 tablet by mouth 4 (four) times daily. For restless leg syndrome Pearlean Manus, MD  Active Spouse/Significant Other, Pharmacy Records  colchicine  0.6 MG tablet [id number]  Take 1 tablet (0.6 mg total) by mouth every 12 (twelve) hours as needed (gout flare.). For mouth sores Ricky Fines, MD  Active   Continuous Glucose Sensor (DEXCOM G7 SENSOR) [place] [id number]  CHANGE SENSOR EVERY 10 DAYS Shamleffer, Donell Cardinal, MD  Active Spouse/Significant Other, Pharmacy Records  cyanocobalamin  (VITAMIN B12) 1000 MCG tablet [id number]  Take 1 tablet (1,000 mcg total) by mouth daily. Raenelle Donalda HERO, MD  Active Spouse/Significant Other, Pharmacy Records  denosumab  (PROLIA ) injection 60 mg [id number]   Stacks, Warren, MD  Active   diphenoxylate -atropine  (LOMOTIL ) 2.5-0.025 MG tablet [id number]  TAKE 2 TABLETS BY MOUTH FOUR TIMES DAILY AS NEEDED FOR DIARRHEA OR LOOSE LEELAND [name], MD  Active Spouse/Significant Other, Pharmacy Records  ELIQUIS  5 MG TABS tablet [id number]  TAKE 1 TABLET TWICE A DAY Turner, Wilbert SAUNDERS, MD  Active Spouse/Significant Other, Pharmacy Records  escitalopram  (LEXAPRO ) 10 MG tablet [id number]  Take 10 mg by mouth daily. [provider]  Active Spouse/Significant Other,  Pharmacy Records           Med Note (WARD, ANGELICA G   Tue Jan 12, 2024  2:28 PM)    ferrous sulfate  325 (65 FE) MG EC tablet [id number]  Take 1 tablet (325 mg total) by mouth 2 (two) times daily. Ricky Fines, MD  Active   furosemide  (LASIX ) 40 MG tablet [id number]  Take 1 tablet (40 mg total) by mouth 2 (two) times daily. Hayes Beckey CROME, NP  Active   gabapentin  (NEURONTIN ) 100 MG capsule [id number]  Take 1 capsule (100 mg total) by mouth 3 (three) times daily. Pearlean Manus, MD  Active Spouse/Significant Other, Pharmacy Records  hydrocortisone  (ANUSOL -HC) 2.5 % rectal cream [id number]  Place rectally 2 (two) times daily. Ricky Fines, MD  Active   hydrOXYzine  (VISTARIL ) 25 MG capsule [id number]  Take 1 capsule (25 mg total) by mouth every 8 (eight) hours as needed for anxiety. Pearlean Manus, MD  Active Spouse/Significant Other, Pharmacy Records  insulin  glargine (LANTUS  SOLOSTAR) 100 UNIT/ML Solostar Pen [id number]  Inject 10 Units into the skin at bedtime. Pearlean Manus, MD  Active Spouse/Significant Other, Pharmacy Records  insulin  lispro (HUMALOG  KWIKPEN) 100 UNIT/ML KwikPen [id number]  Inject 0-10 Units into the skin 2 (two) times daily before a meal. Sliding scale Short acting Humalog  insulin  per sliding scale 0-10 ---:-- Insulin  injection 0-10 Units 0-10 Units Subcutaneous, 3 times daily with meals CBG 70 - 120: 0 unit CBG 121 - 150: 0  unit  CBG 151 - 200: 1 unit CBG 201 - 250: 2 units CBG 251 - 300: 4 units CBG 301 - 350: 6 units  CBG 351 - 400: 8 units  CBG > 400: 10 units Emokpae, Courage, MD  Active Spouse/Significant Other, Pharmacy Records  Insulin  Pen Needle 29G X MISC [id number]  1 Device by Does not apply route daily in the afternoon. Shamleffer, Donell Cardinal, MD  Active Spouse/Significant Other, Pharmacy Records  lidocaine  (LIDODERM ) 5 % [id number]  Place 1 patch onto the skin daily. Remove & Discard patch within 12 hours or as directed by MD Ricky Fines, MD  Active    losartan  (COZAAR ) 25 MG tablet [id number]  Take 25 mg by mouth daily. [provider]  Active Spouse/Significant Other, Pharmacy Records  MAGNESIUM  PO [id number]  Take 1 tablet by mouth daily. [provider]  Active Spouse/Significant Other, Pharmacy Records  Methocarbamol  1000 MG TABS [id number]  Take 1,000 mg by mouth every 6 (six) hours as needed. Lovorn, Megan, MD  Active Spouse/Significant Other, Pharmacy Records  metoprolol  tartrate (LOPRESSOR ) 50 MG tablet [id number]  Take 1 tablet (50 mg total) by mouth 2 (two) times daily. Pearlean Manus, MD  Active Spouse/Significant Other, Pharmacy Records  Misc. Devices (3-IN-1 BEDSIDE TOILET) MISC [id number]  Length of need 99 months M17.11 Osteoarthritis right knee / multiple falls / high risk fall Margrette Taft BRAVO, MD  Active Spouse/Significant Other, Pharmacy Records  ondansetron  (ZOFRAN ) 4 MG tablet [id number]  Take 1 tablet (4 mg total) by mouth every 8 (eight) hours as needed for nausea or vomiting. For cancer related nausea Dettinger, Fonda LABOR, MD  Active Spouse/Significant Other, Pharmacy Records  OXcarbazepine  (TRILEPTAL ) 150 MG tablet [id number]  Take 1 tablet (150 mg total) by mouth 2 (two) times daily. Sheikh, Omair Walcott, DO  Active Spouse/Significant Other, Pharmacy Records  oxyCODONE  (OXYCONTIN ) 20 mg 12 hr tablet [id number]  Take 1 tablet (20 mg total) by mouth every 12 (twelve) hours. G89.3- chronic pain- fill 28 days after 04/23/2024 Debby Fidela CROME, NP  Active   pantoprazole  (PROTONIX ) 40 MG tablet [id number]  Take 1 tablet (40 mg total) by mouth 2 (two) times daily. Pearlean Manus, MD  Active Spouse/Significant Other, Pharmacy Records  potassium chloride  (KLOR-CON ) 10 MEQ tablet [id number]  Take 1 tablet (10 mEq total) by mouth daily. Shlomo Wilbert SAUNDERS, MD  Active Spouse/Significant Other, Pharmacy Records  rOPINIRole  (REQUIP ) 2 MG tablet [id number]  Take 1 tablet (2 mg total) by mouth at bedtime. [name], MD  Active  Spouse/Significant Other, Pharmacy Records  spironolactone  (ALDACTONE ) 25 MG tablet [id number]  Take 0.5 tablets (12.5 mg total) by mouth daily. Hayes Beckey CROME, NP  Active   tirzepatide  (MOUNJARO ) 12.5 MG/0.5ML Pen [id number]  Inject 12.5 mg into the skin once a week. Shamleffer, Ibtehal Jaralla, MD  Active Spouse/Significant Other, Pharmacy Records  vitamin C  (VITAMIN C ) 500 MG tablet [id number]  Take 0.5 tablets (250 mg total) by mouth 2 (two) times daily. Ricky Fines, MD  Active   Vitamin D , Cholecalciferol , 25 MCG (1000 UT) TABS [id number]  Take 1,000 Units by mouth in the morning. [provider]  Active Spouse/Significant Other, Pharmacy Records  Med List Note (Ward, Angelica, CPhT 01/12/24 1617): [employer] insurance, pt's spouse assists with meds            Goals Addressed             This Visit's Progress    [facility] Transitions of Care (TOC) Care Plan  Problems:  Recent Hospitalization for treatment of CHF No Specialist appointment pt reports she has contact # for GI, will call to schedule Pt lives with spouse, pt is working with [facility] Pt reports she has chronic GI bleeding due to 2 bleeding ulcers, denies any bleeding today Pt has finished antibiotic for UTI Pt declined home health, will be starting outpatient therapy on 03/08/24 Saw primary care provider 10/6,  GI 10/7,  10/8 pain management CBG fasting 112 today, weight 162 pounds  Goal:  Over the next 30 days, the patient will not experience hospital readmission  Interventions:  Heart Failure Interventions: Provided education on low sodium diet Discussed importance of daily weight and advised patient to weigh and record daily Discussed the importance of keeping all appointments with provider Reviewed Heart Failure action plan Reinforced signs/ symptoms UTI, prevention Reinforced signs /symptoms GI bleeding, action plan, call provider for any increase in bleeding, seek emergency assistance as  needed Reviewed all upcoming scheduled appointments Encouraged small frequent meals with adequate protein Reviewed carbohydrate modified diet Reviewed correlation of elevated CBG due to infection Reviewed signs /symptoms hypoglycemia, actions to take and importance of eating 3 meals per day and not skipping any meals Pain assessment completed  Patient Self Care Activities:  Attend all scheduled provider appointments Attend church or other social activities Call pharmacy for medication refills 3-7 days in advance of running out of medications Call provider office for new concerns or questions  Notify RN Care Manager of TOC call rescheduling needs Participate in Transition of Care Program/Attend TOC scheduled calls Take medications as prescribed   call office if I gain more than 2 pounds in one day or 5 pounds in one week keep legs up while sitting watch for swelling in feet, ankles and legs every day weigh myself daily develop a rescue plan follow rescue plan if symptoms flare-up eat more whole grains, fruits and vegetables, lean meats and healthy fats track symptoms and what helps feel better or worse dress right for the weather, hot or cold Eat small, frequent meals, include adequate protein at each meal Please follow carbohydrate modified diet Report any increase in GI bleeding Eat 3 meals per day, do not skip meals Follow RULE OF 15 for low blood sugar management:  How to treat low blood sugars (Blood sugar less than 70 mg/dl  Please follow the RULE OF 15 for the treatment of hypoglycemia treatment (When your blood sugars are less than 70 mg/ dl) STEP  1:  Take 15 grams of carbohydrates when your blood sugar is low, which includes One tube of glucose gel STEP 2:  Recheck blood sugar in 15 minutes STEP 3:  3-4 glucose tabs or  3-4 oz of juice or regular soda or If your blood sugar is still low at the 15 minute recheck ---then, go back to STEP 1 and treat again with another 15  grams of carbohydrates  Plan:  Telephone follow up appointment with care management team member scheduled for:  03/17/24 @ 2 pm The patient has been provided with contact information for the care management team and has been advised to call with any health related questions or concerns.          Recommendation:   PCP Follow-up  Follow Up Plan:   Telephone follow-up 03/17/24 @ 2 pm  Mliss Creed [facility], BSN RN Care Manager/ Transition of Care [place]/ [facility] [phone]

## 2024-03-10 NOTE — Telephone Encounter (Signed)
 Copied from CRM [id number]. Topic: Clinical - Lab/Test Results >> Mar 10, 2024  9:26 AM Tobias CROME wrote: Reason for CRM: Patient requesting imaging results. Requesting call once results are available, [phone]

## 2024-03-11 ENCOUNTER — Encounter (HOSPITAL_BASED_OUTPATIENT_CLINIC_OR_DEPARTMENT_OTHER): Admitting: Physical Medicine and Rehabilitation

## 2024-03-11 ENCOUNTER — Ambulatory Visit: Payer: Self-pay | Admitting: Family Medicine

## 2024-03-11 ENCOUNTER — Encounter: Payer: Self-pay | Admitting: Physical Medicine and Rehabilitation

## 2024-03-11 VITALS — BP 119/77 | HR 115 | Ht 62.0 in | Wt 160.4 lb

## 2024-03-11 DIAGNOSIS — Z5181 Encounter for therapeutic drug level monitoring: Secondary | ICD-10-CM | POA: Diagnosis not present

## 2024-03-11 DIAGNOSIS — M25551 Pain in right hip: Secondary | ICD-10-CM | POA: Diagnosis not present

## 2024-03-11 DIAGNOSIS — F3176 Bipolar disorder, in full remission, most recent episode depressed: Secondary | ICD-10-CM | POA: Diagnosis not present

## 2024-03-11 DIAGNOSIS — M79672 Pain in left foot: Secondary | ICD-10-CM

## 2024-03-11 DIAGNOSIS — M79671 Pain in right foot: Secondary | ICD-10-CM

## 2024-03-11 DIAGNOSIS — M25561 Pain in right knee: Secondary | ICD-10-CM | POA: Diagnosis not present

## 2024-03-11 DIAGNOSIS — G6282 Radiation-induced polyneuropathy: Secondary | ICD-10-CM

## 2024-03-11 DIAGNOSIS — G894 Chronic pain syndrome: Secondary | ICD-10-CM

## 2024-03-11 DIAGNOSIS — M545 Low back pain, unspecified: Secondary | ICD-10-CM | POA: Diagnosis not present

## 2024-03-11 DIAGNOSIS — Z79891 Long term (current) use of opiate analgesic: Secondary | ICD-10-CM | POA: Diagnosis not present

## 2024-03-11 DIAGNOSIS — M792 Neuralgia and neuritis, unspecified: Secondary | ICD-10-CM | POA: Insufficient documentation

## 2024-03-11 MED ORDER — LIDOCAINE 5 % EX OINT: 1.0000 | TOPICAL_OINTMENT | Freq: Four times a day (QID) | CUTANEOUS | 5 refills | Status: AC | PRN

## 2024-03-11 MED ORDER — PREGABALIN 25 MG PO CAPS: 25.0000 mg | ORAL_CAPSULE | Freq: Three times a day (TID) | ORAL | 5 refills | Status: AC

## 2024-03-11 NOTE — Patient Instructions (Signed)
 Patient is a 78 yr old female with hx of  CAD, pulmonary HTN- and DM- with CKD- Stage III here for right low back pain with sciatica  As well as upper back/neck pain/myofascial pain. Bipolar d/o.  Pt is here for chronic pain f/u  with recent dx of uterine cancer- - undergoing treatment-    Pt is here as f/u for chronic pain.  Also having pain in feet- ankles and B/L 1st/2nd MTPs- has post op changes and arthritis in feet- no evidence of Charcot foot-    Cannot use Duloxetine /Cymbalta -  because of her Bipolar disorder-  have to have Approval  from Psychiatry.   2. Has been put back on Xanax  1 mg-  at bedtime-    3. We discussed black box warning- of Xanax  and opiates- and over 3x the rate of sudden death on opiates and Xanax - so I understand why Psychiatry not trying to increase dose.     4. On opiates for your back- opiates DONT help nerve pain.  Fibromyalgia- I think you have, but the pain you are describing isn't FMS (fibromyalgia)- it's nerve pain- since not responding to nerve pain  -  Will retry Lyrica  25 mg at bedtime x  4 days then 25 mg 2x/day- x 4 days, then 3x/day- for nerve pain  5. The research is great on nerve pain reduction- Qutenza-  - will have pt see Dr Murray for Qutenza  6. Balance issues could be caused- by chemo meds or radiation- or DM neuropathy- I hope Qutenza will help.   7.  Will give an Rx for lidocaine  ointment- apply up to 4x/day as needed for nerve pain- and cover with saran wrap to keep medicine in place.   8. Just got Oxycontin  20 mg BID refilled- will send in 1 more Rx- for chronic back pain   9. Did Oral drug screen 03/09/24. Per clinic policy  10. F/U-  as scheduled- sees in December-

## 2024-03-11 NOTE — Progress Notes (Signed)
 Subjective:    Patient ID: Amber Stephenson, female    DOB: 08/13/1945, 78 y.o.   MRN: [id number]  HPI   Patient is a 78 yr old female with hx of  CAD, pulmonary HTN- and DM- with CKD- Stage III here for right low back pain with sciatica  As well as upper back/neck pain/myofascial pain. Bipolar disorder.  Pt is here for chronic pain f/u  with recent dx of uterine cancer- - undergoing treatment-    Pt is here as f/u for chronic pain.  Also having pain in feet- ankles and B/L 1st/2nd MTPs- has post op changes and arthritis in feet- no evidence of Charcot foot-     Foot pain started in August after fell on Knee-   Kept getting worse- pain went form hip to toes.   Around bones and muscular Feels like soft tissue/bone pain.   Hasn't seen podiatry in 2 months- Dr Podiatry- Dr Roddie.  Hasn't seen him since fall.    Was in hospital 2 weeks ago.   Acute Diarrhea- x 3 days - GI issues-  had for 4-5 months since finished Cancer treatment-  Also heart failure.   Legs are sensitive to touch- just brushing    Gabapentin  100 mg TID-  on for 3 weeks.   Was on Lyrica  150 mg BID and had increased it to 200 mg BID.  Not seeing a place in the notes, saying Lyrica  stopped- maybe she just stopped taking it.    Having new GERD Sx's.    Poor balance- starting PT for this.    Pain Inventory Average Pain 7-8 Pain Right Now 9 My pain is burning and aching  In the last 24 hours, has pain interfered with the following? General activity 7 Relation with others 5 Enjoyment of life 3 What TIME of day is your pain at its worst? evening Sleep (in general) Poor  Pain is worse with: walking, bending, standing, and some activites Pain improves with: heat/ice, medication, and TENS Relief from Meds: 7  Family History  Problem Relation Age of Onset   Diabetes Mother    Heart disease Mother    Alzheimer's disease Father    Heart disease Sister        CABG   Diabetes Sister    Alcohol   abuse Sister    Stroke Brother    Heart disease Brother    Mental illness Brother    Diabetes Brother    Breast cancer Neg Hx    Ovarian cancer Neg Hx    Colon cancer Neg Hx    Endometrial cancer Neg Hx    Social History   Socioeconomic History   Marital status: Married    Spouse name: Financial planner   Number of children: 3   Years of education: 15   Highest education level: Associate degree: academic program  Occupational History   Occupation: Retired    Comment: Chief Financial Officer  Tobacco Use   Smoking status: Never   Smokeless tobacco: Never  Vaping Use   Vaping status: Never Used  Substance and Sexual Activity   Alcohol  use: Not Currently    Alcohol /week: 0.0 standard drinks of alcohol    Drug use: No   Sexual activity: Not Currently    Partners: Male    Birth control/protection: Post-menopausal  Other Topics Concern   Not on file  Social History Narrative   Lives at home with husband.    They have 3 children who live away - [place] , [place] ,  and [place] .   She is from [place]  and most of her family lives there.   Her husbands's family live nearby   Right handed.   Social Drivers of Corporate investment banker Strain: Low Risk  (08/13/2023)   Overall Financial Resource Strain (CARDIA)    Difficulty of Paying Living Expenses: Not hard at all  Food Insecurity: No Food Insecurity (02/15/2024)   Hunger Vital Sign    Worried About Running Out of Food in the Last Year: Never true    Ran Out of Food in the Last Year: Never true  Transportation Needs: No Transportation Needs (02/15/2024)   PRAPARE - Administrator, Civil Service (Medical): No    Lack of Transportation (Non-Medical): No  Physical Activity: Insufficiently Active (08/13/2023)   Exercise Vital Sign    Days of Exercise per Week: 3 days    Minutes of Exercise per Session: 20 min  Stress: No Stress Concern Present (08/13/2023)   [employer] of Occupational Health - Occupational Stress Questionnaire     Feeling of Stress : Only a little  Social Connections: Socially Integrated (02/15/2024)   Social Connection and Isolation Panel    Frequency of Communication with Friends and Family: More than three times a week    Frequency of Social Gatherings with Friends and Family: Once a week    Attends Religious Services: More than 4 times per year    Active Member of Clubs or Organizations: Yes    Attends Engineer, structural: More than 4 times per year    Marital Status: Married   Past Surgical History:  Procedure Laterality Date   BREAST REDUCTION SURGERY     COLONOSCOPY  2018   6 mm cecal tubular adenoma, sigmoid diverticulosis, random colon biopsies consistent with microscopic colitis   COLONOSCOPY N/A 01/22/2024   Procedure: COLONOSCOPY;  Surgeon: Eartha Angelia Sieving, MD;  Location: [facility];  Service: Gastroenterology;  Laterality: N/A;   COLONOSCOPY N/A 02/23/2024   Procedure: COLONOSCOPY;  Surgeon: Eartha Angelia Sieving, MD;  Location: [facility];  Service: Gastroenterology;  Laterality: N/A;   ESOPHAGOGASTRODUODENOSCOPY N/A 01/22/2024   Procedure: EGD (ESOPHAGOGASTRODUODENOSCOPY);  Surgeon: Eartha Angelia, Sieving, MD;  Location: [facility];  Service: Gastroenterology;  Laterality: N/A;   ESOPHAGOGASTRODUODENOSCOPY N/A 02/23/2024   Procedure: EGD (ESOPHAGOGASTRODUODENOSCOPY);  Surgeon: Eartha Angelia, Sieving, MD;  Location: [facility];  Service: Gastroenterology;  Laterality: N/A;   EYE SURGERY Right    cateracts   HAMMER TOE SURGERY     LEFT HEART CATH AND CORONARY ANGIOGRAPHY N/A 02/03/2018   Procedure: LEFT HEART CATH AND CORONARY ANGIOGRAPHY;  Surgeon: [name], Peter M, MD;  Location: [facility];  Service: Cardiovascular;  Laterality: N/A;   REVERSE SHOULDER ARTHROPLASTY Right 08/19/2019   Procedure: REVERSE SHOULDER ARTHROPLASTY;  Surgeon: Kay Kemps, MD;  Location: [facility];  Service: Orthopedics;  Laterality: Right;  interscalene block    SHOULDER SURGERY Right    I BROKE MY SHOUDLER   THIGH SURGERY     TO REMOVE A TUMOR    Past Surgical History:  Procedure Laterality Date   BREAST REDUCTION SURGERY     COLONOSCOPY  2018   6 mm cecal tubular adenoma, sigmoid diverticulosis, random colon biopsies consistent with microscopic colitis   COLONOSCOPY N/A 01/22/2024   Procedure: COLONOSCOPY;  Surgeon: Eartha Angelia Sieving, MD;  [facility]: [facility];  Service: Gastroenterology;  Laterality: N/A;   COLONOSCOPY N/A 02/23/2024   Procedure: COLONOSCOPY;  Surgeon:  Eartha Angelia Sieving, MD;  [facility]: [facility];  Service: Gastroenterology;  Laterality: N/A;   ESOPHAGOGASTRODUODENOSCOPY N/A 01/22/2024   Procedure: EGD (ESOPHAGOGASTRODUODENOSCOPY);  Surgeon: Eartha Angelia, Sieving, MD;  [facility]: [facility];  Service: Gastroenterology;  Laterality: N/A;   ESOPHAGOGASTRODUODENOSCOPY N/A 02/23/2024   Procedure: EGD (ESOPHAGOGASTRODUODENOSCOPY);  Surgeon: Eartha Angelia, Sieving, MD;  [facility]: [facility];  Service: Gastroenterology;  Laterality: N/A;   EYE SURGERY Right    cateracts   HAMMER TOE SURGERY     LEFT HEART CATH AND CORONARY ANGIOGRAPHY N/A 02/03/2018   Procedure: LEFT HEART CATH AND CORONARY ANGIOGRAPHY;  Surgeon: [name], Peter M, MD;  Location: [facility]VAS[facility] CV LAB;  Service: Cardiovascular;  Laterality: N/A;   REVERSE SHOULDER ARTHROPLASTY Right 08/19/2019   Procedure: REVERSE SHOULDER ARTHROPLASTY;  Surgeon: Kay Kemps, MD;  Location: [facility];  Service: Orthopedics;  Laterality: Right;  interscalene block   SHOULDER SURGERY Right    I BROKE MY SHOUDLER   THIGH SURGERY     TO REMOVE A TUMOR    Past Medical History:  Diagnosis Date   Allergic rhinitis 02/24/2019   Ambulatory dysfunction 04/23/2020   Atrial fibrillation with RVR (HCC) 05/19/2017   Back pain/sacroiliitis--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   Benign paroxysmal positional vertigo due to bilateral vestibular disorder 05/20/2019   Bipolar 1 disorder (HCC) 01/23/2015   with GAD, benzo dependence   CAD (coronary artery disease)    Nonobstructive; Managed by Dr. Charls   Cardiomegaly 01/12/2018   CHF (congestive heart failure) 06/08/2022   Chronic back pain 01/04/2015   Chronic constipation 04/25/2020   Chronic diastolic heart failure (HCC) 05/30/2015   Chronic pain syndrome 08/22/2019   back pain, sacroiliitis   Chronic post-traumatic stress disorder (PTSD) 12/06/2020   Diabetic neuropathy (HCC) 02/06/2016   Dyslipidemia 09/24/2020   Essential hypertension    Functional diarrhea 10/26/2020   Herpes genitalis in women 07/16/2015   History of adenomatous polyp of colon 05/21/2019   Overview:   03/31/17: Colonoscopy: nonadvanced adenoma, microscopic colitis, f/u 5 yrs, [facility]   History of radiation therapy    Endometrial- 02/18/23-03/31/23-Dr. Lynwood Kinard   Hypotension 09/01/2022   Insomnia 01/23/2015   Metabolic acidosis 01/12/2023   Migraine headache with aura 02/12/2016   Myofascial pain dysfunction syndrome 08/22/2019   Non-alcoholic fatty liver disease 01/12/2018   OSA (obstructive sleep apnea) 02/24/2019   10/09/2018 - HST  - AHI 40.6    Osteopenia 12/06/2020   Rx alendronate  35 mg.   Pinguecula 12/06/2020   Presbyopia 12/06/2020   Pulmonary hypertension    RLS (restless legs syndrome) 04/27/2015   RSV (respiratory syncytial virus infection) 06/10/2023   Tremor, essential 12/11/2021   Type II diabetes mellitus (HCC) 09/24/2020   BP 119/77 (BP Location: Left Arm, Patient Position: Sitting, Cuff Size: Normal)   Pulse (!) 115   Ht 5' 2 (1.575 m)   Wt 160 lb 6.1 oz (72.7 kg)   SpO2 93%   BMI 29.33 kg/m   Opioid Risk Score:   Fall Risk Score:  `1  Depression screen PHQ 2/9     03/11/2024    1:46 PM 02/26/2024   11:28 AM 02/15/2024    9:47 AM 02/09/2024    2:46 PM 01/12/2024     9:56 AM 01/08/2024    2:04 PM 01/01/2024    2:37 PM  Depression screen PHQ 2/9  Decreased Interest  0 0 0 0 0 0 0  Down, Depressed, Hopeless 0 0 0  0 0 0  PHQ - 2 Score 0 0 0 0 0 0 0  Difficult doing work/chores   Not difficult at all         Review of Systems  Musculoskeletal:  Positive for arthralgias, back pain and myalgias.       Right side low back pain, hip, knee, ankle and foot pain  All other systems reviewed and are negative.      Objective:   Physical Exam        Assessment & Plan:   Patient is a 78 yr old female with hx of  CAD, pulmonary HTN- and DM- with CKD- Stage III here for right low back pain with sciatica  As well as upper back/neck pain/myofascial pain. Bipolar d/o.  Pt is here for chronic pain f/u  with recent dx of uterine cancer- - undergoing treatment-    Pt is here as f/u for chronic pain.  Also having pain in feet- ankles and B/L 1st/2nd MTPs- has post op changes and arthritis in feet- no evidence of Charcot foot-    Cannot use Duloxetine /Cymbalta -  because of her Bipolar disorder-  have to have Approval  from Psychiatry.   2. Has been put back on Xanax  1 mg-  at bedtime-    3. We discussed black box warning- of Xanax  and opiates- and over 3x the rate of sudden death on opiates and Xanax - so I understand why Psychiatry not trying to increase dose.     4. On opiates for your back- opiates DONT help nerve pain.  Fibromyalgia- I think you have, but the pain you are describing isn't FMS (fibromyalgia)- it's nerve pain- since not responding to nerve pain  -  Will retry Lyrica  25 mg at bedtime x  4 days then 25 mg 2x/day- x 4 days, then 3x/day- for nerve pain  5. The research is great on nerve pain reduction- Qutenza-  - will have pt see Dr Murray for Qutenza  6. Balance issues could be caused- by chemo meds or radiation- or DM neuropathy- I hope Qutenza will help.   7.  Will give an Rx for lidocaine  ointment- apply up to 4x/day as needed for nerve  pain- and cover with saran wrap to keep medicine in place.   8. Just got Oxycontin  20 mg BID refilled- will send in 1 more Rx- for chronic back pain   9. Did Oral drug screen 03/09/24. Per clinic policy  10. F/U-  as scheduled- sees in December-  and Qutenza asap  I spent a total of 42   minutes on total care today- >50% coordination of care- due to d/w about nerve pain- as detailed above- about balance, pain meds- and lidocaine - alo ordered Lyrica 

## 2024-03-14 ENCOUNTER — Inpatient Hospital Stay: Admitting: Family Medicine

## 2024-03-14 NOTE — Telephone Encounter (Signed)
 Patient with diagnosis of afib on Eliquis  for anticoagulation.    Procedure: Colonoscopy  Date of procedure: TBD   CHA2DS2-VASc Score = 7   This indicates a 11.2% annual risk of stroke. The patient's score is based upon: CHF History: 1 HTN History: 1 Diabetes History: 1 Stroke History: 0 Vascular Disease History: 1 Age Score: 2 Gender Score: 1      CrCl 84 ml/min Platelet count 169  Patient has not had an Afib/aflutter ablation in the last 3 months, DCCV within the last 4 weeks or a watchman implanted in the last 45 days   Per office protocol, patient can hold Eliquis  for 2 days prior to procedure.     **This guidance is not considered finalized until pre-operative APP has relayed final recommendations.**

## 2024-03-14 NOTE — Telephone Encounter (Signed)
   Patient Name: Amber Stephenson  DOB: [birth year] MRN: [id number]  Primary Cardiologist: Wilbert Bihari, MD  Chart reviewed as part of pre-operative protocol coverage. Pre-op clearance already addressed by colleagues in earlier phone notes. To summarize recommendations:  -CHA2DS2-VASc Score = 7   This indicates a 11.2% annual risk of stroke. The patient's score is based upon: CHF History: 1 HTN History: 1 Diabetes History: 1 Stroke History: 0 Vascular Disease History: 1 Age Score: 2 Gender Score: 1     CrCl 84 ml/min Platelet count 169   Patient has not had an Afib/aflutter ablation in the last 3 months, DCCV within the last 4 weeks or a watchman implanted in the last 45 days    Per office protocol, patient can hold Eliquis  for 2 days prior to procedure.  Please restart when medically safe to do so.  Medical clearance was no requested.   Will route this bundled recommendation to requesting provider via Epic fax function and remove from pre-op pool. Please call with questions.  Amber LOISE Fabry, PA-C 03/14/2024, 4:00 PM

## 2024-03-15 ENCOUNTER — Ambulatory Visit (INDEPENDENT_AMBULATORY_CARE_PROVIDER_SITE_OTHER): Admitting: Family Medicine

## 2024-03-15 ENCOUNTER — Encounter: Payer: Self-pay | Admitting: Family Medicine

## 2024-03-15 ENCOUNTER — Encounter: Payer: Self-pay | Admitting: Physician Assistant

## 2024-03-15 ENCOUNTER — Ambulatory Visit (INDEPENDENT_AMBULATORY_CARE_PROVIDER_SITE_OTHER): Admitting: Physician Assistant

## 2024-03-15 VITALS — BP 134/87 | HR 91 | Temp 98.5°F | Ht 62.0 in | Wt 165.0 lb

## 2024-03-15 VITALS — BP 112/68 | HR 92 | Resp 20 | Ht 62.0 in | Wt 168.0 lb

## 2024-03-15 DIAGNOSIS — E1165 Type 2 diabetes mellitus with hyperglycemia: Secondary | ICD-10-CM

## 2024-03-15 DIAGNOSIS — M79671 Pain in right foot: Secondary | ICD-10-CM

## 2024-03-15 DIAGNOSIS — G3184 Mild cognitive impairment, so stated: Secondary | ICD-10-CM

## 2024-03-15 DIAGNOSIS — E538 Deficiency of other specified B group vitamins: Secondary | ICD-10-CM | POA: Diagnosis not present

## 2024-03-15 DIAGNOSIS — K121 Other forms of stomatitis: Secondary | ICD-10-CM

## 2024-03-15 DIAGNOSIS — M79672 Pain in left foot: Secondary | ICD-10-CM | POA: Diagnosis not present

## 2024-03-15 DIAGNOSIS — Z794 Long term (current) use of insulin: Secondary | ICD-10-CM | POA: Diagnosis not present

## 2024-03-15 DIAGNOSIS — K1379 Other lesions of oral mucosa: Secondary | ICD-10-CM

## 2024-03-15 LAB — DRUG TOX MONITOR 1 W/CONF, ORAL FLD
AMINOCLONAZEPAM: NEGATIVE ng/mL (ref <0.50)
Alprazolam: 0.52 ng/mL — ABNORMAL HIGH (ref <0.50)
Amphetamines: NEGATIVE ng/mL (ref <10)
Barbiturates: NEGATIVE ng/mL (ref <10)
Benzodiazepines: POSITIVE ng/mL — AB (ref <0.50)
Buprenorphine: NEGATIVE ng/mL (ref <0.10)
Chlordiazepoxide: NEGATIVE ng/mL (ref <0.50)
Clonazepam: NEGATIVE ng/mL (ref <0.50)
Cocaine: NEGATIVE ng/mL (ref <5.0)
Codeine: NEGATIVE ng/mL (ref <2.5)
Diazepam: NEGATIVE ng/mL (ref <0.50)
Dihydrocodeine: NEGATIVE ng/mL (ref <2.5)
Fentanyl: NEGATIVE ng/mL (ref <0.10)
Flunitrazepam: NEGATIVE ng/mL (ref <0.50)
Flurazepam: NEGATIVE ng/mL (ref <0.50)
Heroin Metabolite: NEGATIVE ng/mL (ref <1.0)
Hydrocodone: NEGATIVE ng/mL (ref <2.5)
Hydromorphone: NEGATIVE ng/mL (ref <2.5)
Lorazepam: NEGATIVE ng/mL (ref <0.50)
MARIJUANA: NEGATIVE ng/mL (ref <2.5)
MDMA: NEGATIVE ng/mL (ref <10)
Meprobamate: NEGATIVE ng/mL (ref <2.5)
Methadone: NEGATIVE ng/mL (ref <5.0)
Midazolam: NEGATIVE ng/mL (ref <0.50)
Morphine: NEGATIVE ng/mL (ref <2.5)
Nicotine Metabolite: NEGATIVE ng/mL (ref <5.0)
Nordiazepam: NEGATIVE ng/mL (ref <0.50)
Norhydrocodone: NEGATIVE ng/mL (ref <2.5)
Noroxycodone: 11.8 ng/mL — ABNORMAL HIGH (ref <2.5)
Opiates: POSITIVE ng/mL — AB (ref <2.5)
Oxazepam: NEGATIVE ng/mL (ref <0.50)
Oxycodone: 78 ng/mL — ABNORMAL HIGH (ref <2.5)
Oxymorphone: NEGATIVE ng/mL (ref <2.5)
Phencyclidine: NEGATIVE ng/mL (ref <10)
Tapentadol: NEGATIVE ng/mL (ref <5.0)
Temazepam: NEGATIVE ng/mL (ref <0.50)
Tramadol: NEGATIVE ng/mL (ref <5.0)
Triazolam: NEGATIVE ng/mL (ref <0.50)
Zolpidem: NEGATIVE ng/mL (ref <5.0)

## 2024-03-15 LAB — DRUG TOX ALC METAB W/CON, ORAL FLD: Alcohol Metabolite: NEGATIVE ng/mL (ref <25)

## 2024-03-15 MED ORDER — CYANOCOBALAMIN 1000 MCG/ML IJ SOLN
1000.0000 ug | INTRAMUSCULAR | Status: AC
  Administered 2024-03-15 – 2024-03-29 (×3): 1000 ug via INTRAMUSCULAR

## 2024-03-15 NOTE — Progress Notes (Signed)
 Subjective:  Patient ID: Amber Stephenson, female    DOB: 1945/07/18  Age: 78 y.o. MRN: [id number]  CC: Hospitalization Follow-up   HPI  Discussed the use of AI scribe software for clinical note transcription with the patient, who gave verbal consent to proceed.  History of Present Illness Amber Stephenson is a 78 year old female who presents with persistent mouth sores and burning sensation.  She has persistent sores and a burning sensation in her mouth, which have not improved despite taking oral vitamin B12 supplements. There is bleeding on the sides of her lips, particularly when her mouth is opened. Her dentist suggested that vitamin B12 shots might help alleviate her symptoms.  She experiences a burning sensation in her mouth when eating various foods, including a fried egg and pizza, which she likens to having 'Tabasco on it'. This burning sensation affects her ability to eat comfortably.  She was hospitalized approximately three weeks ago for ten days and has been following up with her healthcare providers since then. She recently saw Dr. Jolinda for sensitivity around her foot and ankle bone, for which she was prescribed a lidocaine  ointment. She reports that she is currently taking both Lyrica  and gabapentin , as she understood from her visit with Dr. Suzanna.  She mentions taking metformin  in the past, which could potentially affect vitamin B12 absorption, but she is no longer on it. Her blood sugar levels have been stable, although she experienced a low blood sugar episode that woke her up at 2:15 AM recently.          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), Benign paroxysmal positional vertigo due to bilateral vestibular disorder (05/20/2019), Bipolar 1 disorder (HCC) (01/23/2015), CAD (coronary artery disease), Cardiomegaly (01/12/2018), CHF (congestive heart failure) (06/08/2022), Chronic back pain (01/04/2015), Chronic constipation (04/25/2020), Chronic diastolic heart failure (HCC) (87/71/7983), Chronic pain syndrome (08/22/2019), Chronic post-traumatic stress disorder (PTSD) (12/06/2020), Diabetic neuropathy (HCC) (02/06/2016), Dyslipidemia (09/24/2020), Essential hypertension, Functional diarrhea (10/26/2020), Herpes genitalis in women (07/16/2015), History of adenomatous polyp of colon (05/21/2019), History of radiation therapy, Hypotension (09/01/2022), Insomnia (01/23/2015), Metabolic acidosis (01/12/2023), Migraine headache with aura (02/12/2016), Myofascial pain dysfunction syndrome (08/22/2019), Non-alcoholic fatty liver disease (91/86/7980), OSA (obstructive sleep apnea) (02/24/2019), Osteopenia (12/06/2020), Pinguecula (12/06/2020), Presbyopia (12/06/2020), Pulmonary hypertension, RLS (restless legs syndrome) (04/27/2015), RSV (respiratory syncytial virus infection) (06/10/2023), Tremor, essential (12/11/2021), and Type II diabetes mellitus (HCC) (09/24/2020).   She has a past surgical history that includes THIGH SURGERY; Shoulder surgery (Right); Breast reduction surgery; Eye surgery (Right); Hammer toe surgery; LEFT HEART CATH AND CORONARY ANGIOGRAPHY (N/A, 02/03/2018); Reverse shoulder arthroplasty (Right, 08/19/2019); Colonoscopy (2018); Esophagogastroduodenoscopy (N/A, 01/22/2024); Colonoscopy (N/A, 01/22/2024); Colonoscopy (N/A, 02/23/2024); and Esophagogastroduodenoscopy (N/A, 02/23/2024).   Her family history includes Alcohol  abuse in her sister; Alzheimer's disease in her father; Diabetes in her brother, mother, and sister; Heart  disease in her brother, mother, and sister; Mental illness in her brother; Stroke in her brother.She reports that she has never smoked. She has never used smokeless tobacco. She reports that she does not  currently use alcohol . She reports that she does not use drugs.    ROS Review of Systems  Constitutional: Negative.   HENT:  Positive for mouth sores.   Eyes:  Negative for visual disturbance.  Respiratory:  Negative for shortness of breath.   Cardiovascular:  Negative for chest pain.  Gastrointestinal:  Negative for abdominal pain.  Musculoskeletal:  Negative for arthralgias.  Neurological:  Positive for weakness.    Objective:  BP 134/87   Pulse 91   Temp 98.5 F (36.9 C)   Ht 5' 2 (1.575 m)   Wt 165 lb (74.8 kg)   SpO2 94%   BMI 30.18 kg/m   BP Readings from Last 3 Encounters:  03/15/24 134/87  03/15/24 112/68  03/11/24 119/77    Wt Readings from Last 3 Encounters:  03/15/24 165 lb (74.8 kg)  03/15/24 168 lb (76.2 kg)  03/11/24 160 lb 6.1 oz (72.7 kg)     Physical Exam Physical Exam GENERAL: Alert, cooperative, well developed, no acute distress HEENT: Normocephalic, normal oropharynx, moist mucous membranes CHEST: Clear to auscultation bilaterally, No wheezes, rhonchi, or crackles CARDIOVASCULAR: Normal heart rate and rhythm, S1 and S2 normal without murmurs ABDOMEN: Soft, non-tender, non-distended, without organomegaly, Normal bowel sounds EXTREMITIES: No cyanosis or edema NEUROLOGICAL: Cranial nerves grossly intact, Moves all extremities without gross motor or sensory deficit   Assessment & Plan:  Mouth sores -     Vitamin B12 -     Cyanocobalamin   Stomatitis  Type 2 diabetes mellitus with hyperglycemia, with long-term current use of insulin  (HCC)  Pain in both feet    Assessment and Plan Assessment & Plan Oral mucosal burning and bleeding   She experiences persistent burning and bleeding of the oral mucosa, especially on the sides of the lips,  with no improvement from current vitamin B12 supplementation. A possible vitamin B12 deficiency due to malabsorption, potentially worsened by previous metformin  use, is suspected. She reports a burning sensation with all foods, including mild ones like eggs. Blood will be drawn to check vitamin B12 levels, followed by the administration of the first vitamin B12 shot. Weekly vitamin B12 shots will be discussed if levels are low or if she opts to continue despite normal levels, considering potential symptom improvement.  Possible vitamin B12 deficiency   Oral mucosal symptoms and metformin  use suggest a possible vitamin B12 deficiency. No recent B12 level is available for review. It was discussed that if B12 levels are normal, Medicare may not cover injection costs, though they are not expensive. Blood will be drawn to check vitamin B12 levels, followed by the administration of the first vitamin B12 shot. Weekly vitamin B12 shots will be discussed if levels are low or if she opts to continue despite normal levels, considering potential symptom improvement.  Type 2 diabetes mellitus   Her blood sugar levels are well-controlled, though she experienced a nocturnal hypoglycemic episode requiring food intake at 2:15 AM.  Foot and ankle pain   She has sensitivity around the foot and ankle bone. A previous visit with Dr. Jolinda resulted in a prescription for lidocaine  ointment and Lyrica . An unusual combination of Lyrica  and gabapentin  was prescribed, with the rationale unclear. She will continue current medications as prescribed by Dr. Jolinda, and consideration will be given to discussing the rationale for using both Lyrica  and gabapentin  with him.       Follow-up: No follow-ups on file.  Butler Der, M.D.

## 2024-03-15 NOTE — Patient Instructions (Signed)
  Follow up in 6 months  Continue to control mood as per PCP and Psychiatry, monitor polypharmacy Recommend close follow up with Pulmonary

## 2024-03-15 NOTE — Progress Notes (Signed)
 Assessment/Plan:    Mild cognitive impairment   Amber Stephenson is a very pleasant 78 y.o. RH female with a history of CAD, CHF, Afib with a presentation to the ED with RVR, on 08/19/2023, chronic respiratory failure on O2, chronic pain, OSA, endometrial cancer  presenting today in follow-up for evaluation of memory loss.  Last neuropsych evaluation was inconclusive given her psychiatric history along with polypharmacy.  She is not on antidementia medications as of yet because she is afraid of the side effects after reading them. She needs some assistance with ADLS. Mood is better than prior, well managed with medications Memory is stable, she declines MMSE today .   Recommendations:   Follow up in 6 months. Recommend good control of cardiovascular risk factors Continue to control mood as per psychiatry.  Follow-up with pain clinic for chronic pain Due to the above she is on multiple medications which may contribute to some of her memory difficulties, careful monitor dosing Follow-up with pulmonary for OSA.  Continue oxygen  at night and as needed during the day    Subjective:   This patient is accompanied in the office by her husband  who supplements the history. Previous records as well as any outside records available were reviewed prior to todays visit.   Patient was last seen on 09/02/2023 with MMSE 30/30 .    Any changes in memory since last visit? It about the same or better she says .  She has some difficulty with short-term memory, long-term memory is excellent. Repeats oneself?  Endorsed Disoriented when walking into a room?  Patient denies    Misplacing objects?  Patient denies   Wandering behavior?   Denies. Any personality changes since last visit? Denies.   Any worsening depression?:  She is under psychiatric care Hallucinations or paranoia?  Endorsed, she  may sense people when coming out of her sleep.   Seizures?   Denies.    Any sleep changes? Sleeps well, with  vivid dreams, denies REM behavior or sleepwalking   Sleep apnea?  Endorsed, on O2  Any hygiene concerns?   Denies.   Independent of bathing and dressing?  Endorsed  Does the patient needs help with medications?  Husband is in charge   Who is in charge of the finances?  Husband is in charge     Any changes in appetite?  Denies, she is on Mounjaro .    Patient have trouble swallowing?  Denies.   Does the patient cook?  Any kitchen accidents such as leaving the stove on?   Denies.   Any headaches?    Denies.   Vision changes? Denies. Chronic pain?  Endorsed, attends the pain clinic, she is on multiple medications Ambulates with difficulty?   Endorsed, she has chronic back pain, with limited mobility, she uses a walker.    Recent falls or head injuries? 3 falls, mechanical, if the texture of the ground changes she may fall .   Unilateral weakness, numbness or tingling?  Denies.   Any tremors?  Denies.   Any anosmia?    Denies.   Any incontinence of urine?  Denies.  Recent UTI, 2 months ago test any bowel dysfunction?  Endorsed.      Patient lives with her husband.  Does the patient drive?  No longer drives      Neuropsychological evaluation 08/08/22  Scores across stand-alone and embedded performance validity measures were variable. As such, while there remains the potential that current scores reflect true  impairment and progressive decline (see below), these also may reflect Amber Stephenson occasionally zoning out while attempting cognitive tasks, coupled with significant psychiatric distress, acute on chronic pain, and reported sleep dysfunction. Briefly, results suggested diffuse impairment impacting nearly all assessed cognitive domains. Performances were appropriate across confrontation naming and visuospatial abilities, while some variability was seen across recognition/consolidation aspects of memory.       History on Initial Assessment 07/17/2017: This is a 78 year old right-handed woman with  a history of hypertension, hyperlipidemia, diabetes, atrial fibrillation, chronic pain, bipolar disorder, presenting for evaluation of memory changes. She feels her memory has gotten really bad over the past 2 months. She cannot remember words/word-finding difficulties. She does not think it's Alzheimer's but feels like she is not paying attention. Family reminds her that they had told her something already previously. Her husband has to remind her of her medications. They put her pills together in a pillbox. Her husband is in charge of finances. She stopped driving 4 years ago, she denied getting lost when she used to drive. She started noticing memory changes after she was started on a medication after she had her colonoscopy. She has also been dealing with a lot of pain, she states her Percocet dose was cut in half a month ago and this is causing her a lot of issues. She has poor sleep, 3 hours at the most. She was recently started on Trazodone  by her psychiatrist, which may be helping. Her father had Alzheimer's disease. She denies any history of significant head injuries, no alcohol  use.   She has frequent headaches, around 20 headache days a month with stabbing pain in the frontal region that can last up to 3 days, with associated nausea/vomiting. If she takes medication, pain lasts up to 2 hours. She has dizziness at least 1-2 times a day where it feels like the room is spinning. She has had falls for no clear reason, last fall was 3 months ago. She has chronic neck and back pain, no focal numbness/tingling/weakness. She has tremors in both hands and has a diagnosis of essential tremor, it is very difficult to do things, it has affected her handwriting, computer use. No family history of tremors. No bowel/bladder dysfunction or anosmia.      I personally reviewed MRI brain without contrast done 05/13/17 which did not show any acute changes. There was mild diffuse atrophy and mild to moderate  chronic microvascular disease.       MRI brain 04/28/21  1. No acute intracranial process. 2. Mildly advanced atrophy for age, with a suggestion of slightly greater atrophy in the right temporal lobe.    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   Benign paroxysmal positional vertigo due to bilateral vestibular disorder 05/20/2019   Bipolar 1 disorder (HCC) 01/23/2015   with GAD, benzo dependence   CAD (coronary artery disease)    Nonobstructive; Managed by Dr. Charls   Cardiomegaly 01/12/2018   CHF (congestive heart failure) 06/08/2022   Chronic back pain 01/04/2015   Chronic constipation 04/25/2020   Chronic diastolic heart failure (HCC) 05/30/2015   Chronic pain syndrome 08/22/2019   back pain, sacroiliitis   Chronic post-traumatic stress disorder (PTSD) 12/06/2020   Diabetic neuropathy (HCC) 02/06/2016  Dyslipidemia 09/24/2020   Essential hypertension    Functional diarrhea 10/26/2020   Herpes genitalis in women 07/16/2015   History of adenomatous polyp of colon 05/21/2019   Overview:   03/31/17: Colonoscopy: nonadvanced adenoma, microscopic colitis, f/u 5 yrs, [facility]/GAP   History of radiation therapy    Endometrial- 02/18/23-03/31/23-Dr. Lynwood Kinard   Hypotension 09/01/2022   Insomnia 01/23/2015   Metabolic acidosis 01/12/2023   Migraine headache with aura 02/12/2016   Myofascial pain dysfunction syndrome 08/22/2019   Non-alcoholic fatty liver disease 01/12/2018   OSA (obstructive sleep apnea) 02/24/2019   10/09/2018 - HST  - AHI 40.6    Osteopenia 12/06/2020   Rx alendronate  35 mg.   Pinguecula 12/06/2020   Presbyopia 12/06/2020   Pulmonary hypertension    RLS (restless legs syndrome)  04/27/2015   RSV (respiratory syncytial virus infection) 06/10/2023   Tremor, essential 12/11/2021   Type II diabetes mellitus (HCC) 09/24/2020     Past Surgical History:  Procedure Laterality Date   BREAST REDUCTION SURGERY     COLONOSCOPY  2018   6 mm cecal tubular adenoma, sigmoid diverticulosis, random colon biopsies consistent with microscopic colitis   COLONOSCOPY N/A 01/22/2024   Procedure: COLONOSCOPY;  Surgeon: Eartha Angelia Sieving, MD;  Location: [facility];  Service: Gastroenterology;  Laterality: N/A;   COLONOSCOPY N/A 02/23/2024   Procedure: COLONOSCOPY;  Surgeon: Eartha Angelia Sieving, MD;  Location: [facility];  Service: Gastroenterology;  Laterality: N/A;   ESOPHAGOGASTRODUODENOSCOPY N/A 01/22/2024   Procedure: EGD (ESOPHAGOGASTRODUODENOSCOPY);  Surgeon: Eartha Angelia, Sieving, MD;  Location: [facility];  Service: Gastroenterology;  Laterality: N/A;   ESOPHAGOGASTRODUODENOSCOPY N/A 02/23/2024   Procedure: EGD (ESOPHAGOGASTRODUODENOSCOPY);  Surgeon: Eartha Angelia, Sieving, MD;  Location: [facility];  Service: Gastroenterology;  Laterality: N/A;   EYE SURGERY Right    cateracts   HAMMER TOE SURGERY     LEFT HEART CATH AND CORONARY ANGIOGRAPHY N/A 02/03/2018   Procedure: LEFT HEART CATH AND CORONARY ANGIOGRAPHY;  Surgeon: [name], Peter M, MD;  Location[facility] INVASIVE CV LAB;  Service: Cardiovascular;  Laterality: N/A;   REVERSE SHOULDER ARTHROPLASTY Right 08/19/2019   Procedure: REVERSE SHOULDER ARTHROPLASTY;  Surgeon: Kay Kemps, MD;  Location: [facility];  Service: Orthopedics;  Laterality: Right;  interscalene block   SHOULDER SURGERY Right    I BROKE MY SHOUDLER   THIGH SURGERY     TO REMOVE A TUMOR      PREVIOUS MEDICATIONS:   CURRENT MEDICATIONS:  Outpatient Encounter Medications as of 03/15/2024  Medication Sig   acetaminophen  (TYLENOL ) 325 MG tablet Take 2 tablets (650 mg total) by mouth every 6 (six) hours as needed for mild pain (pain  score 1-3) or fever (or Fever >/= 101).   ALPRAZolam  (XANAX ) 1 MG tablet Take 1 mg by mouth at bedtime as needed for sleep.   ARIPiprazole  (ABILIFY ) 2 MG tablet Take 1 tablet (2 mg total) by mouth at bedtime.   atorvastatin  (LIPITOR) 80 MG tablet Take 1 tablet (80 mg total) by mouth daily.   carbidopa -levodopa  (SINEMET ) 10-100 MG tablet Take 1 tablet by mouth 4 (four) times daily. For restless leg syndrome   colchicine  0.6 MG tablet Take 1 tablet (0.6 mg total) by mouth every 12 (twelve) hours as needed (gout flare.). For mouth sores   Continuous Glucose Sensor (DEXCOM G7 SENSOR) MISC CHANGE SENSOR EVERY 10 DAYS   cyanocobalamin  (VITAMIN B12) 1000 MCG tablet Take 1 tablet (1,000 mcg total) by mouth daily.   diphenoxylate -atropine  (LOMOTIL ) 2.5-0.025 MG  tablet TAKE 2 TABLETS BY MOUTH FOUR TIMES DAILY AS NEEDED FOR DIARRHEA OR LOOSE STOOLS   ELIQUIS  5 MG TABS tablet TAKE 1 TABLET TWICE A DAY   escitalopram  (LEXAPRO ) 10 MG tablet Take 10 mg by mouth daily.   ferrous sulfate  325 (65 FE) MG EC tablet Take 1 tablet (325 mg total) by mouth 2 (two) times daily.   furosemide  (LASIX ) 40 MG tablet Take 1 tablet (40 mg total) by mouth 2 (two) times daily.   gabapentin  (NEURONTIN ) 100 MG capsule Take 1 capsule (100 mg total) by mouth 3 (three) times daily.   hydrocortisone  (ANUSOL -HC) 2.5 % rectal cream Place rectally 2 (two) times daily.   hydrOXYzine  (VISTARIL ) 25 MG capsule Take 1 capsule (25 mg total) by mouth every 8 (eight) hours as needed for anxiety.   insulin  glargine (LANTUS  SOLOSTAR) 100 UNIT/ML Solostar Pen Inject 10 Units into the skin at bedtime.   insulin  lispro (HUMALOG  KWIKPEN) 100 UNIT/ML KwikPen Inject 0-10 Units into the skin 2 (two) times daily before a meal. Sliding scale Short acting Humalog  insulin  per sliding scale 0-10 ---:-- Insulin  injection 0-10 Units 0-10 Units Subcutaneous, 3 times daily with meals CBG 70 - 120: 0 unit CBG 121 - 150: 0 unit  CBG 151 - 200: 1 unit CBG 201 - 250: 2  units CBG 251 - 300: 4 units CBG 301 - 350: 6 units  CBG 351 - 400: 8 units  CBG > 400: 10 units   Insulin  Pen Needle 29G X MISC 1 Device by Does not apply route daily in the afternoon.   lidocaine  (LIDODERM ) 5 % Place 1 patch onto the skin daily. Remove & Discard patch within 12 hours or as directed by MD   lidocaine  (XYLOCAINE ) 5 % ointment Apply 1 Application topically 4 (four) times daily as needed. For foot and ankle nerve pain-  can use up to 4x/day as needed   losartan  (COZAAR ) 25 MG tablet Take 25 mg by mouth daily.   MAGNESIUM  PO Take 1 tablet by mouth daily.   Methocarbamol  1000 MG TABS Take 1,000 mg by mouth every 6 (six) hours as needed.   metoprolol  tartrate (LOPRESSOR ) 50 MG tablet Take 1 tablet (50 mg total) by mouth 2 (two) times daily.   Misc. Devices (3-IN-1 BEDSIDE TOILET) MISC Length of need 99 months M17.11 Osteoarthritis right knee / multiple falls / high risk fall   ondansetron  (ZOFRAN ) 4 MG tablet Take 1 tablet (4 mg total) by mouth every 8 (eight) hours as needed for nausea or vomiting. For cancer related nausea   OXcarbazepine  (TRILEPTAL ) 150 MG tablet Take 1 tablet (150 mg total) by mouth 2 (two) times daily.   oxyCODONE  (OXYCONTIN ) 20 mg 12 hr tablet Take 1 tablet (20 mg total) by mouth every 12 (twelve) hours. G89.3- chronic pain- fill 28 days after 04/23/2024   pantoprazole  (PROTONIX ) 40 MG tablet Take 1 tablet (40 mg total) by mouth 2 (two) times daily.   potassium chloride  (KLOR-CON ) 10 MEQ tablet Take 1 tablet (10 mEq total) by mouth daily.   pregabalin  (LYRICA ) 25 MG capsule Take 1 capsule (25 mg total) by mouth 3 (three) times daily. Will start at at bedtime x 4 days then BID x 4 days, then TID- for nerve pain   rOPINIRole  (REQUIP ) 2 MG tablet Take 1 tablet (2 mg total) by mouth at bedtime.   spironolactone  (ALDACTONE ) 25 MG tablet Take 0.5 tablets (12.5 mg total) by mouth daily.   tirzepatide  (MOUNJARO )  12.5 MG/0.5ML Pen Inject 12.5 mg into the skin once a  week.   vitamin C  (VITAMIN C ) 500 MG tablet Take 0.5 tablets (250 mg total) by mouth 2 (two) times daily.   Vitamin D , Cholecalciferol , 25 MCG (1000 UT) TABS Take 1,000 Units by mouth in the morning.   Facility-Administered Encounter Medications as of 03/15/2024  Medication   bupivacaine -epinephrine  (MARCAINE  W/ EPI) 0.5% -1:200000 injection   denosumab  (PROLIA ) injection 60 mg     Objective:     PHYSICAL EXAMINATION:    VITALS:   Vitals:   03/15/24 1124  BP: 112/68  Pulse: 92  Resp: 20  SpO2: 93%  Weight: 168 lb (76.2 kg)  Height: 5' 2 (1.575 m)    GEN:  The patient appears stated age and is in NAD. HEENT:  Normocephalic, atraumatic.   Neurological examination:  General: NAD, well-groomed, appears stated age. Orientation: The patient is alert. Oriented to person, place and  not to date.  Cranial nerves: There is good facial symmetry.The speech is fluent and clear. No aphasia or dysarthria. Fund of knowledge is appropriate. Recent memory impaired and remote memory is normal.  Attention and concentration are normal.  Able to name objects and repeat phrases.  Hearing is intact to conversational tone..  Sensation: Sensation is intact to light touch throughout Motor: Strength is at least antigravity x4. DTR's 2/4 in UE/LE      01/26/2019   11:00 AM 08/06/2018   11:00 AM  Montreal Cognitive Assessment   Visuospatial/ Executive (0/5) 2 4  Naming (0/3) 3 3  Attention: Read list of digits (0/2) 1 2  Attention: Read list of letters (0/1) 0 1  Attention: Serial 7 subtraction starting at 100 (0/3) 0 1  Language: Repeat phrase (0/2) 1 1  Language : Fluency (0/1) 0 0  Abstraction (0/2) 1 1  Delayed Recall (0/5) 0 4  Orientation (0/6) 3 6  Total 11 23       09/02/2023   12:00 PM 11/08/2021    7:00 AM 08/28/2020   11:00 AM  MMSE - Mini Mental State Exam  Orientation to time 5 5 4   Orientation to Place 5 5 5   Registration 3 3 3   Attention/ Calculation 5 3 1   Recall 3 3 3    Language- name 2 objects 2 2 2   Language- repeat 1 1 1   Language- follow 3 step command 3 3 3   Language- read & follow direction 1 1 1   Write a sentence 1 1 1   Copy design 1 1 1   Total score 30 28 25        Movement examination: Tone: There is normal tone in the UE/LE Abnormal movements:  no tremor.  No myoclonus.  No asterixis.   Coordination:  There is no decremation with RAM's. Normal finger to nose  Gait and Station: The patient has difficulty arising out of a deep-seated chair without the use of the hands. The patient's stride length is short  Uses a walker . Gait is cautious and narrow., needs arm support to ambulate   Thank you for allowing us  the opportunity to participate in the care of this nice patient. Please do not hesitate to contact us  for any questions or concerns.   Total time spent on today's visit was 20 minutes dedicated to this patient today, preparing to see patient, examining the patient, ordering tests and/or medications and counseling the patient, documenting clinical information in the EHR or other health record, independently interpreting results and  communicating results to the patient/family, discussing treatment and goals, answering patient's questions and coordinating care.  Cc:  [name], MD  Camie Sevin 03/15/2024 12:23 PM

## 2024-03-16 ENCOUNTER — Ambulatory Visit: Payer: Self-pay | Admitting: Family Medicine

## 2024-03-16 ENCOUNTER — Encounter (INDEPENDENT_AMBULATORY_CARE_PROVIDER_SITE_OTHER): Payer: Self-pay | Admitting: Gastroenterology

## 2024-03-16 ENCOUNTER — Encounter: Payer: Self-pay | Admitting: *Deleted

## 2024-03-16 LAB — VITAMIN B12: Vitamin B-12: 843 pg/mL (ref 232–1245)

## 2024-03-16 NOTE — Telephone Encounter (Signed)
 Spoke to pt and she wishes to wait until Dec. Advised pt will call her once we get providers schedule for Dec. Pt verbalized understanding

## 2024-03-16 NOTE — Telephone Encounter (Signed)
 LMOVM to return call.

## 2024-03-16 NOTE — Progress Notes (Signed)
Hello [name],  Your lab result is normal and/or stable.Some minor variations that are not significant are commonly marked abnormal, but do not represent any medical problem for you.  Best regards, Leonetta Mcgivern, M.D.

## 2024-03-17 ENCOUNTER — Other Ambulatory Visit: Payer: Self-pay | Admitting: *Deleted

## 2024-03-17 ENCOUNTER — Ambulatory Visit: Admitting: Orthopedic Surgery

## 2024-03-17 NOTE — Patient Instructions (Signed)
 Visit Information  Thank you for taking time to visit with me today. Please don't hesitate to contact me if I can be of assistance to you before our next scheduled telephone appointment.  Our next appointment is by telephone on 03/25/24 @ 115 pm  Following is a copy of your care plan:   Goals Addressed             This Visit's Progress    [facility] Transitions of Care (TOC) Care Plan       Problems:  Recent Hospitalization for treatment of CHF No Specialist appointment pt reports she has contact # for GI, will call to schedule Pt lives with spouse, pt is working with [facility] Pt reports she has chronic GI bleeding due to 2 bleeding ulcers, denies any bleeding today Pt has finished antibiotic for UTI Pt declined home health, will be starting outpatient therapy on 03/08/24 03/17/24- Saw primary care provider 10/14, received B12 injection and will be going weekly- pt reports this is for mouth sores she has had x 4 months, denies any GI bleeding today CBG fasting 97 today, weight 162 pounds  Goal:  Over the next 30 days, the patient will not experience hospital readmission  Interventions:  Heart Failure Interventions: Provided education on low sodium diet Discussed importance of daily weight and advised patient to weigh and record daily Discussed the importance of keeping all appointments with provider Reviewed Heart Failure action plan Reinforced signs/ symptoms UTI, prevention Reinforced signs /symptoms GI bleeding, action plan, call provider for any increase in bleeding, seek emergency assistance as needed Reviewed all upcoming scheduled appointments Encouraged small frequent meals with adequate protein Reviewed carbohydrate modified diet Reviewed correlation of elevated CBG due to infection Reinforced signs /symptoms hypoglycemia, actions to take and importance of eating 3 meals per day and not skipping any meals Pain assessment completed Reviewed signs /symptoms of  infection  Patient Self Care Activities:  Attend all scheduled provider appointments Attend church or other social activities Call pharmacy for medication refills 3-7 days in advance of running out of medications Call provider office for new concerns or questions  Notify RN Care Manager of TOC call rescheduling needs Participate in Transition of Care Program/Attend TOC scheduled calls Take medications as prescribed   call office if I gain more than 2 pounds in one day or 5 pounds in one week keep legs up while sitting watch for swelling in feet, ankles and legs every day weigh myself daily develop a rescue plan follow rescue plan if symptoms flare-up eat more whole grains, fruits and vegetables, lean meats and healthy fats track symptoms and what helps feel better or worse dress right for the weather, hot or cold Eat small, frequent meals, include adequate protein at each meal Please follow carbohydrate modified diet Report any increase in GI bleeding Eat 3 meals per day, do not skip meals Follow RULE OF 15 for low blood sugar management:  How to treat low blood sugars (Blood sugar less than 70 mg/dl  Please follow the RULE OF 15 for the treatment of hypoglycemia treatment (When your blood sugars are less than 70 mg/ dl) STEP  1:  Take 15 grams of carbohydrates when your blood sugar is low, which includes One tube of glucose gel STEP 2:  Recheck blood sugar in 15 minutes STEP 3:  3-4 glucose tabs or  3-4 oz of juice or regular soda or If your blood sugar is still low at the 15 minute recheck ---then,  go back to STEP 1 and treat again with another 15 grams of carbohydrates  Plan:  Telephone follow up appointment with care management team member scheduled for:  03/25/24 @ 115 pm The patient has been provided with contact information for the care management team and has been advised to call with any health related questions or concerns.         Patient verbalizes understanding of  instructions and care plan provided today and agrees to view in MyChart. Active MyChart status and patient understanding of how to access instructions and care plan via MyChart confirmed with patient.     Telephone follow up appointment with care management team member scheduled for: 03/25/24 @ 115 pm  Please call the care guide team at [phone] if you need to cancel or reschedule your appointment.   Please call the Suicide and Crisis Lifeline: 988 call the [place]  National Suicide Prevention Lifeline: 770-359-1664 or TTY: (787)348-9927 TTY 646 814 2166) to talk to a trained counselor call 1-800-273-TALK (toll free, 24 hour hotline) go to [facility] [address] [place] [phone]) call the [facility] Crisis Line: [phone] call 911 if you are experiencing a Mental Health or Behavioral Health Crisis or need someone to talk to.  Mliss Creed [facility], BSN RN Care Manager/ Transition of Care [place]/ [facility] [phone]

## 2024-03-17 NOTE — Patient Outreach (Signed)
 Transition of Care week 4  Visit Note  03/17/2024  Name: Amber Stephenson MRN: [id number]          DOB: 1945-10-24  Situation: Patient enrolled in [facility] 30-day program. Visit completed with patient by telephone.   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   Benign paroxysmal positional vertigo due to bilateral vestibular disorder 05/20/2019   Bipolar 1 disorder (HCC) 01/23/2015   with GAD, benzo dependence   CAD (coronary artery disease)    Nonobstructive; Managed by Dr. Charls   Cardiomegaly 01/12/2018   CHF (congestive heart failure) 06/08/2022   Chronic back pain 01/04/2015   Chronic constipation 04/25/2020   Chronic diastolic heart failure (HCC) 05/30/2015   Chronic pain syndrome 08/22/2019   back pain, sacroiliitis   Chronic post-traumatic stress disorder (PTSD) 12/06/2020   Diabetic neuropathy (HCC) 02/06/2016   Dyslipidemia 09/24/2020   Essential hypertension    Functional diarrhea 10/26/2020   Herpes genitalis in women 07/16/2015   History of adenomatous polyp of colon 05/21/2019   Overview:   03/31/17: Colonoscopy: nonadvanced adenoma, microscopic colitis, f/u 5 yrs, [facility]/GAP   History of radiation therapy    Endometrial- 02/18/23-03/31/23-Dr. Lynwood Kinard   Hypotension 09/01/2022   Insomnia 01/23/2015   Metabolic acidosis 01/12/2023   Migraine headache with aura 02/12/2016   Myofascial pain dysfunction syndrome 08/22/2019   Non-alcoholic fatty liver disease 01/12/2018   OSA (obstructive sleep apnea) 02/24/2019   10/09/2018 - HST  - AHI 40.6    Osteopenia 12/06/2020   Rx alendronate  35 mg.    Pinguecula 12/06/2020   Presbyopia 12/06/2020   Pulmonary hypertension    RLS (restless legs syndrome) 04/27/2015   RSV (respiratory syncytial virus infection) 06/10/2023   Tremor, essential 12/11/2021   Type II diabetes mellitus (HCC) 09/24/2020    Assessment: Patient Reported Symptoms: Cognitive Cognitive Status: No symptoms reported, Alert and oriented to person, place, and time, Normal speech and language skills, Able to follow simple commands      Neurological Neurological Review of Symptoms: No symptoms reported    HEENT HEENT Symptoms Reported: No symptoms reported      Cardiovascular Cardiovascular Symptoms Reported: No symptoms reported Does patient have uncontrolled Hypertension?: No Weight: 162 lb (73.5 kg) Cardiovascular Self-Management Outcome: 4 (good) Cardiovascular Comment: pt is weighing daily  Respiratory Respiratory Symptoms Reported: No symptoms reported    Endocrine Endocrine Symptoms Reported: No symptoms reported Is patient diabetic?: Yes Is patient checking blood sugars at home?: Yes List most recent blood sugar readings, include date and time of day: FBS 97  pt states  my blood sugar is very good Endocrine Self-Management Outcome: 4 (good)  Gastrointestinal Gastrointestinal Symptoms Reported: No symptoms reported Additional Gastrointestinal Details: denies GI bleeding Gastrointestinal Management Strategies: Adequate rest Gastrointestinal Self-Management Outcome: 3 (uncertain) Gastrointestinal Comment: reviewed signs/symptoms GI bleeding    Genitourinary Genitourinary Symptoms Reported: No symptoms reported    Integumentary Integumentary Symptoms Reported: Other Other Integumentary Symptoms: sores in mouth- per pt has had for 4 months, is now on Vitamin B12 injections given at primary care provider office Skin Management Strategies: Adequate rest, Medication therapy, Routine screening Skin Self-Management Outcome: 3 (uncertain) Skin Comment:  reviewed signs/ symptoms of infection  Musculoskeletal Musculoskelatal Symptoms Reviewed: No symptoms reported        Psychosocial Psychosocial Symptoms Reported: No symptoms reported         There were no vitals filed for this visit.  Medications Reviewed Today     Reviewed by Aura Mliss LABOR, RN (Registered Nurse) on 03/17/24 at 1411  Med List Status: <None>   Medication Order Taking? Sig Documenting Provider Last Dose Status Informant  acetaminophen  (TYLENOL ) 325 MG tablet [id number]  Take 2 tablets (650 mg total) by mouth every 6 (six) hours as needed for mild pain (pain score 1-3) or fever (or Fever >/= 101). Pearlean Manus, MD  Active Spouse/Significant Other, Pharmacy Records  ALPRAZolam  (XANAX ) 1 MG tablet [id number]  Take 1 mg by mouth at bedtime as needed for sleep. [provider]  Active Spouse/Significant Other, Pharmacy Records  ARIPiprazole  (ABILIFY ) 2 MG tablet [id number]  Take 1 tablet (2 mg total) by mouth at bedtime. Pearlean Manus, MD  Active Spouse/Significant Other, Pharmacy Records  atorvastatin  (LIPITOR) 80 MG tablet [id number]  Take 1 tablet (80 mg total) by mouth daily. Pearlean Manus, MD  Active Spouse/Significant Other, Pharmacy Records  bupivacaine -epinephrine  (MARCAINE  W/ EPI) 0.5% -1:200000 injection [id number]   Hollis, Kevin D, MD  Active   carbidopa -levodopa  (SINEMET ) 10-100 MG tablet [id number]  Take 1 tablet by mouth 4 (four) times daily. For restless leg syndrome Pearlean Manus, MD  Active Spouse/Significant Other, Pharmacy Records  colchicine  0.6 MG tablet [id number]  Take 1 tablet (0.6 mg total) by mouth every 12 (twelve) hours as needed (gout flare.). For mouth sores Ricky Fines, MD  Active   Continuous Glucose Sensor (DEXCOM G7 SENSOR) [place] [id number]  CHANGE SENSOR EVERY 10 DAYS Shamleffer, Donell Cardinal, MD  Active Spouse/Significant Other, Pharmacy Records  cyanocobalamin  (VITAMIN B12) 1000 MCG tablet [id number]  Take 1 tablet (1,000  mcg total) by mouth daily. Raenelle Donalda HERO, MD  Active Spouse/Significant Other, Pharmacy Records  cyanocobalamin  (VITAMIN B12) injection 1,000 mcg [id number]   [name], MD  Active   denosumab  (PROLIA ) injection 60 mg [id number]   Stacks, Warren, MD  Active   diphenoxylate -atropine  (LOMOTIL ) 2.5-0.025 MG tablet [id number]  TAKE 2 TABLETS BY MOUTH FOUR TIMES DAILY AS NEEDED FOR DIARRHEA OR LOOSE LEELAND [name], MD  Active Spouse/Significant Other, Pharmacy Records  ELIQUIS  5 MG TABS tablet [id number]  TAKE 1 TABLET TWICE A DAY Turner, Wilbert SAUNDERS, MD  Active Spouse/Significant Other, Pharmacy Records  escitalopram  (LEXAPRO ) 10 MG tablet [id number]  Take 10 mg by mouth daily. [provider]  Active Spouse/Significant Other, Pharmacy Records           Med Note (WARD, ANGELICA G   Tue Jan 12, 2024  2:28 PM)    ferrous sulfate  325 (65 FE) MG EC tablet [id number]  Take 1 tablet (325 mg total) by mouth 2 (two) times daily. Ricky Fines, MD  Active   furosemide  (LASIX ) 40 MG tablet [id number]  Take 1 tablet (40 mg total) by mouth 2 (two) times daily. Hayes Beckey CROME, NP  Active   gabapentin  (NEURONTIN ) 100 MG capsule [id number]  Take 1 capsule (100 mg total) by mouth 3 (three) times daily. Pearlean Manus, MD  Active Spouse/Significant Other, Pharmacy Records  hydrocortisone  (ANUSOL -HC) 2.5 % rectal cream [id number]  Place rectally 2 (two) times daily. Ricky Fines, MD  Active   hydrOXYzine  (VISTARIL ) 25 MG capsule [id number]  Take 1 capsule (25 mg total) by mouth every 8 (eight) hours  as needed for anxiety. Pearlean Manus, MD  Active Spouse/Significant Other, Pharmacy Records  insulin  glargine (LANTUS  SOLOSTAR) 100 UNIT/ML Solostar Pen [id number]  Inject 10 Units into the skin at bedtime. Pearlean Manus, MD  Active Spouse/Significant Other, Pharmacy Records  insulin  lispro (HUMALOG  KWIKPEN) 100 UNIT/ML KwikPen [id number]  Inject 0-10 Units into the skin 2 (two) times daily before a meal.  Sliding scale Short acting Humalog  insulin  per sliding scale 0-10 ---:-- Insulin  injection 0-10 Units 0-10 Units Subcutaneous, 3 times daily with meals CBG 70 - 120: 0 unit CBG 121 - 150: 0 unit  CBG 151 - 200: 1 unit CBG 201 - 250: 2 units CBG 251 - 300: 4 units CBG 301 - 350: 6 units  CBG 351 - 400: 8 units  CBG > 400: 10 units Emokpae, Courage, MD  Active Spouse/Significant Other, Pharmacy Records  Insulin  Pen Needle 29G X MISC [id number]  1 Device by Does not apply route daily in the afternoon. Shamleffer, Donell Cardinal, MD  Active Spouse/Significant Other, Pharmacy Records  lidocaine  (LIDODERM ) 5 % [id number]  Place 1 patch onto the skin daily. Remove & Discard patch within 12 hours or as directed by MD Ricky Fines, MD  Active   lidocaine  (XYLOCAINE ) 5 % ointment [id number]  Apply 1 Application topically 4 (four) times daily as needed. For foot and ankle nerve pain-  can use up to 4x/day as needed Lovorn, Megan, MD  Active   losartan  (COZAAR ) 25 MG tablet [id number]  Take 25 mg by mouth daily. [provider]  Active Spouse/Significant Other, Pharmacy Records  MAGNESIUM  PO [id number]  Take 1 tablet by mouth daily. [provider]  Active Spouse/Significant Other, Pharmacy Records  Methocarbamol  1000 MG TABS [id number]  Take 1,000 mg by mouth every 6 (six) hours as needed. Lovorn, Megan, MD  Active Spouse/Significant Other, Pharmacy Records  metoprolol  tartrate (LOPRESSOR ) 50 MG tablet [id number]  Take 1 tablet (50 mg total) by mouth 2 (two) times daily. Pearlean Manus, MD  Active Spouse/Significant Other, Pharmacy Records  Misc. Devices (3-IN-1 BEDSIDE TOILET) MISC [id number]  Length of need 99 months M17.11 Osteoarthritis right knee / multiple falls / high risk fall Margrette Taft BRAVO, MD  Active Spouse/Significant Other, Pharmacy Records  ondansetron  (ZOFRAN ) 4 MG tablet [id number]  Take 1 tablet (4 mg total) by mouth every 8 (eight) hours as needed for nausea or vomiting. For  cancer related nausea Dettinger, Fonda LABOR, MD  Active Spouse/Significant Other, Pharmacy Records  OXcarbazepine  (TRILEPTAL ) 150 MG tablet [id number]  Take 1 tablet (150 mg total) by mouth 2 (two) times daily. Sheikh, Omair [place], DO  Active Spouse/Significant Other, Pharmacy Records  oxyCODONE  (OXYCONTIN ) 20 mg 12 hr tablet [id number]  Take 1 tablet (20 mg total) by mouth every 12 (twelve) hours. G89.3- chronic pain- fill 28 days after 04/23/2024 Debby Fidela CROME, NP  Active   pantoprazole  (PROTONIX ) 40 MG tablet [id number]  Take 1 tablet (40 mg total) by mouth 2 (two) times daily. Pearlean Manus, MD  Active Spouse/Significant Other, Pharmacy Records  potassium chloride  (KLOR-CON ) 10 MEQ tablet [id number]  Take 1 tablet (10 mEq total) by mouth daily. Shlomo Wilbert SAUNDERS, MD  Active Spouse/Significant Other, Pharmacy Records  pregabalin  (LYRICA ) 25 MG capsule [id number]  Take 1 capsule (25 mg total) by mouth 3 (three) times daily. Will start at at bedtime x 4 days then BID x 4 days, then TID- for nerve pain Lovorn, Megan, MD  Active   rOPINIRole  (REQUIP ) 2 MG tablet [id number]  Take 1 tablet (2 mg total) by mouth at bedtime. [name], MD  Active Spouse/Significant Other, Pharmacy Records  spironolactone  (ALDACTONE ) 25 MG tablet [id number]  Take 0.5 tablets (12.5 mg total) by mouth daily. Hayes Beckey CROME, NP  Active   tirzepatide  (MOUNJARO ) 12.5 MG/0.5ML Pen [id number]  Inject 12.5 mg into the skin once a week. Shamleffer, Donell Cardinal, MD  Active Spouse/Significant Other, Pharmacy Records  vitamin C  (VITAMIN C ) 500 MG tablet [id number]  Take 0.5 tablets (250 mg total) by mouth 2 (two) times daily. Ricky Fines, MD  Active   Vitamin D , Cholecalciferol , 25 MCG (1000 UT) TABS [id number]  Take 1,000 Units by mouth in the morning. [provider]  Active Spouse/Significant Other, Pharmacy Records  Med List Note (Ward, Angelica, CPhT 01/12/24 1617): [employer] insurance, pt's spouse assists with meds             Goals Addressed             This Visit's Progress    [facility] Transitions of Care (TOC) Care Plan       Problems:  Recent Hospitalization for treatment of CHF No Specialist appointment pt reports she has contact # for GI, will call to schedule Pt lives with spouse, pt is working with [facility] Pt reports she has chronic GI bleeding due to 2 bleeding ulcers, denies any bleeding today Pt has finished antibiotic for UTI Pt declined home health, will be starting outpatient therapy on 03/08/24 03/17/24- Saw primary care provider 10/14, received B12 injection and will be going weekly- pt reports this is for mouth sores she has had x 4 months, denies any GI bleeding today CBG fasting 97 today, weight 162 pounds  Goal:  Over the next 30 days, the patient will not experience hospital readmission  Interventions:  Heart Failure Interventions: Provided education on low sodium diet Discussed importance of daily weight and advised patient to weigh and record daily Discussed the importance of keeping all appointments with provider Reviewed Heart Failure action plan Reinforced signs/ symptoms UTI, prevention Reinforced signs /symptoms GI bleeding, action plan, call provider for any increase in bleeding, seek emergency assistance as needed Reviewed all upcoming scheduled appointments Encouraged small frequent meals with adequate protein Reviewed carbohydrate modified diet Reviewed correlation of elevated CBG due to infection Reinforced signs /symptoms hypoglycemia, actions to take and importance of eating 3 meals per day and not skipping any meals Pain assessment completed Reviewed signs /symptoms of infection  Patient Self Care Activities:  Attend all scheduled provider appointments Attend church or other social activities Call pharmacy for medication refills 3-7 days in advance of running out of medications Call provider office for new concerns or questions  Notify RN Care  Manager of TOC call rescheduling needs Participate in Transition of Care Program/Attend TOC scheduled calls Take medications as prescribed   call office if I gain more than 2 pounds in one day or 5 pounds in one week keep legs up while sitting watch for swelling in feet, ankles and legs every day weigh myself daily develop a rescue plan follow rescue plan if symptoms flare-up eat more whole grains, fruits and vegetables, lean meats and healthy fats track symptoms and what helps feel better or worse dress right for the weather, hot or cold Eat small, frequent meals, include adequate protein at each meal Please follow carbohydrate modified diet Report any increase in GI bleeding Eat 3 meals per day, do not skip meals Follow RULE OF 15 for low blood  sugar management:  How to treat low blood sugars (Blood sugar less than 70 mg/dl  Please follow the RULE OF 15 for the treatment of hypoglycemia treatment (When your blood sugars are less than 70 mg/ dl) STEP  1:  Take 15 grams of carbohydrates when your blood sugar is low, which includes One tube of glucose gel STEP 2:  Recheck blood sugar in 15 minutes STEP 3:  3-4 glucose tabs or  3-4 oz of juice or regular soda or If your blood sugar is still low at the 15 minute recheck ---then, go back to STEP 1 and treat again with another 15 grams of carbohydrates  Plan:  Telephone follow up appointment with care management team member scheduled for:  03/25/24 @ 115 pm The patient has been provided with contact information for the care management team and has been advised to call with any health related questions or concerns.          Recommendation:   PCP Follow-up  Follow Up Plan:   Telephone follow-up 03/25/24 @ 115 pm  Mliss Creed [facility], BSN RN Care Manager/ Transition of Care [place]/ [facility] [phone]

## 2024-03-22 ENCOUNTER — Ambulatory Visit: Admitting: *Deleted

## 2024-03-22 ENCOUNTER — Ambulatory Visit: Admitting: Internal Medicine

## 2024-03-22 DIAGNOSIS — K1379 Other lesions of oral mucosa: Secondary | ICD-10-CM | POA: Diagnosis not present

## 2024-03-22 NOTE — Progress Notes (Signed)
 Patient is in office today for a nurse visit for B12 Injection. Patient Injection was given in the  Left deltoid. Patient tolerated injection well.

## 2024-03-23 ENCOUNTER — Encounter: Payer: Self-pay | Admitting: Internal Medicine

## 2024-03-23 ENCOUNTER — Ambulatory Visit: Admitting: Internal Medicine

## 2024-03-23 VITALS — BP 130/70 | HR 88 | Ht 62.0 in | Wt 165.0 lb

## 2024-03-23 DIAGNOSIS — Z794 Long term (current) use of insulin: Secondary | ICD-10-CM | POA: Diagnosis not present

## 2024-03-23 DIAGNOSIS — E119 Type 2 diabetes mellitus without complications: Secondary | ICD-10-CM

## 2024-03-23 MED ORDER — TIRZEPATIDE 12.5 MG/0.5ML ~~LOC~~ SOAJ: 12.5000 mg | SUBCUTANEOUS | 3 refills | Status: DC

## 2024-03-23 NOTE — Patient Instructions (Addendum)
-   Decrease Lantus  10 units ONCE daily  - Continue  Mounjaro  12.5 mg weekly  -Humalog  correctional insulin : ADD extra units on insulin  to your meal-time Humalog   dose if your blood sugars are higher than 160. Use the scale below to help guide you before each meal   Blood sugar before meal Number of units to inject  Less than 155 0 unit  156 - 180 1 units  181 - 205 2 units  206 - 230 3 units  231 - 255 4 units  256 - 280 5 units  281 - 305 6 units  306 - 330 7 units  331 - 355 8 units  356 - 380 9 units   381 - 405 10 units        HOW TO TREAT LOW BLOOD SUGARS (Blood sugar LESS THAN 70 MG/DL) Please follow the RULE OF 15 for the treatment of hypoglycemia treatment (when your (blood sugars are less than 70 mg/dL)   STEP 1: Take 15 grams of carbohydrates when your blood sugar is low, which includes:  3-4 GLUCOSE TABS  OR 3-4 OZ OF JUICE OR REGULAR SODA OR ONE TUBE OF GLUCOSE GEL    STEP 2: RECHECK blood sugar in 15 MINUTES STEP 3: If your blood sugar is still low at the 15 minute recheck --> then, go back to STEP 1 and treat AGAIN with another 15 grams of carbohydrates.

## 2024-03-23 NOTE — Progress Notes (Signed)
 Name: Amber Stephenson  Age/ Sex: 78 y.o., female   MRN/ DOB: [id number], 1945-12-02     PCP: [name], MD   Reason for Endocrinology Evaluation: Type 2 Diabetes Mellitus  Initial Endocrine Consultative Visit: 09/24/2020    PATIENT IDENTIFIER: Amber Stephenson is a 78 y.o. female with a past medical history of T2DM, HTN, Dyslipidemia, OSA, CHF and A.Fib. The patient has followed with Endocrinology clinic since 09/24/2020 for consultative assistance with management of her diabetes.  DIABETIC HISTORY:  Amber Stephenson was diagnosed with DM in 2007, she has been on basal insulin  for years, prandial insulin  started 2016. GLP-1 agonists started in 2017. Metformin  restarted 2017. Her hemoglobin A1c has ranged from 6.7% in 08/2019, peaking at 10.9% in 04/2020.  Saw Dr. Lenis in 2016 and Dr. Von in 2017   On her initial visit to our clinic she had an A1c 9.1% . We increased Metformin , Rybelsus  and adjusted MDI regimen.    Jardiance  discontinues 10/2021 due to recurrent genital skin infections and yeast infections   Switch Rybelsus  to Mounjaro  05/2022  I have attempted to prescribe the OmniPod 02/2023 but she was undergoing radiation therapy and they opted to postpone  SUBJECTIVE:   During the last visit (09/21/2023): A1c 6.7%     Today (03/23/2024): Amber Stephenson is here for a follow up on diabetes management.  She is accompanied by her spouse today. She checks her blood sugars multiple  times daily through the CGM. SABRA The patient has had hypoglycemic episodes since the last clinic visit. She is not symptomatic with these episodes   She continues to follow up with neurology for cognitive impairment  Patient follows with physical medicine and rehab for chronic pain syndrome Patient to follow-up with GI for microscopic colitis and hemorrhoids, she is s/p colonoscopy in September, 2025  She follows with Gyn for endometrial cancer, completed radiation                       She does  follow-up with podiatry, per an outside facility No nausea or vomiting  Has constipation  No palpitations    HOME DIABETES REGIMEN:  Metformin  500 mg 2 tabs daily - not taking  Mounjaro  10 mg weekly Lantus  10 units BID Humalog  6 units TIDQAC- not taking  CF: Humalog  (BG-130/30) TIDQAC     Statin: yes ACE-I/ARB: no Prior Diabetic Education: no    CONTINUOUS GLUCOSE MONITORING RECORD INTERPRETATION    Dates of Recording: 10/9-10/22/2025  Sensor description: dexcom   Results statistics:   CGM use % of time 94  Average and SD 127/35  Time in range 92 %  % Time Above 180 7  % Time above 250 0  % Time Below target 1     Glycemic patterns summary: BGs are optimal overnight and fluctuate during the day  Hyperglycemic episodes postprandial  Hypoglycemic episodes occurred overnight night  Overnight periods: Optimal      DIABETIC COMPLICATIONS: Microvascular complications:   Denies: CKD,  retinopathy , neuropathy ( she has this in the charts but denies neuropathy) Last eye exam: Completed 07/2020   Macrovascular complications:   Denies: CAD, PVD, CVA    HISTORY:  Past Medical History:  Past Medical History:  Diagnosis Date   Allergic rhinitis 02/24/2019   Ambulatory dysfunction 04/23/2020   Atrial fibrillation with RVR (HCC) 05/19/2017   Back pain/sacroiliitis--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   Benign paroxysmal positional vertigo due to bilateral vestibular disorder 05/20/2019   Bipolar 1 disorder (HCC) 01/23/2015   with GAD, benzo dependence   CAD (coronary artery disease)    Nonobstructive; Managed by Dr. Charls   Cardiomegaly 01/12/2018   CHF (congestive heart failure) 06/08/2022   Chronic back pain 01/04/2015   Chronic constipation 04/25/2020   Chronic diastolic heart failure (HCC) 05/30/2015   Chronic pain syndrome 08/22/2019   back  pain, sacroiliitis   Chronic post-traumatic stress disorder (PTSD) 12/06/2020   Diabetic neuropathy (HCC) 02/06/2016   Dyslipidemia 09/24/2020   Essential hypertension    Functional diarrhea 10/26/2020   Herpes genitalis in women 07/16/2015   History of adenomatous polyp of colon 05/21/2019   Overview:   03/31/17: Colonoscopy: nonadvanced adenoma, microscopic colitis, f/u 5 yrs, Murphy/GAP   History of radiation therapy    Endometrial- 02/18/23-03/31/23-Dr. Lynwood Kinard   Hypotension 09/01/2022   Insomnia 01/23/2015   Metabolic acidosis 01/12/2023   Migraine headache with aura 02/12/2016   Myofascial pain dysfunction syndrome 08/22/2019   Non-alcoholic fatty liver disease 01/12/2018   OSA (obstructive sleep apnea) 02/24/2019   10/09/2018 - HST  - AHI 40.6    Osteopenia 12/06/2020   Rx alendronate  35 mg.   Pinguecula 12/06/2020   Presbyopia 12/06/2020   Pulmonary hypertension    RLS (restless legs syndrome) 04/27/2015   RSV (respiratory syncytial virus infection) 06/10/2023   Tremor, essential 12/11/2021   Type II diabetes mellitus (HCC) 09/24/2020   Past Surgical History:  Past Surgical History:  Procedure Laterality Date   BREAST REDUCTION SURGERY     COLONOSCOPY  2018   6 mm cecal tubular adenoma, sigmoid diverticulosis, random colon biopsies consistent with microscopic colitis   COLONOSCOPY N/A 01/22/2024   Procedure: COLONOSCOPY;  Surgeon: Eartha Angelia Sieving, MD;  Location: [facility];  Service: Gastroenterology;  Laterality: N/A;   COLONOSCOPY N/A 02/23/2024   Procedure: COLONOSCOPY;  Surgeon: Eartha Angelia Sieving, MD;  Location: [facility];  Service: Gastroenterology;  Laterality: N/A;   ESOPHAGOGASTRODUODENOSCOPY N/A 01/22/2024   Procedure: EGD (ESOPHAGOGASTRODUODENOSCOPY);  Surgeon: Eartha Angelia, Sieving, MD;  Location: [facility];  Service: Gastroenterology;  Laterality: N/A;   ESOPHAGOGASTRODUODENOSCOPY N/A 02/23/2024   Procedure: EGD  (ESOPHAGOGASTRODUODENOSCOPY);  Surgeon: Eartha Angelia, Sieving, MD;  Location: [facility];  Service: Gastroenterology;  Laterality: N/A;   EYE SURGERY Right    cateracts   HAMMER TOE SURGERY     LEFT HEART CATH AND CORONARY ANGIOGRAPHY N/A 02/03/2018   Procedure: LEFT HEART CATH AND CORONARY ANGIOGRAPHY;  Surgeon: [name], Peter M, MD;  Location: [facility];  Service: Cardiovascular;  Laterality: N/A;   REVERSE SHOULDER ARTHROPLASTY Right 08/19/2019   Procedure: REVERSE SHOULDER ARTHROPLASTY;  Surgeon: Kay Kemps, MD;  Location: [facility];  Service: Orthopedics;  Laterality: Right;  interscalene block   SHOULDER SURGERY Right    I BROKE MY SHOUDLER   THIGH SURGERY     TO REMOVE A TUMOR    Social History:  reports that she has never smoked. She has never used smokeless tobacco. She reports that she does not currently use alcohol . She reports that she does not use drugs. Family History:  Family History  Problem Relation Age of Onset   Diabetes Mother    Heart disease Mother    Alzheimer's disease Father    Heart disease Sister        CABG   Diabetes Sister    Alcohol   abuse Sister    Stroke Brother    Heart disease Brother    Mental illness Brother    Diabetes Brother    Breast cancer Neg Hx    Ovarian cancer Neg Hx    Colon cancer Neg Hx    Endometrial cancer Neg Hx      HOME MEDICATIONS: Allergies as of 03/23/2024       Reactions   Iodinated Contrast Media Anaphylaxis, Swelling, Other (See Comments)   Throat closes, swelling of throat and tongue   Iodine Anaphylaxis   Latex Other (See Comments)   Latex tape pulls skin with it   Tape Other (See Comments)   Pulls off the skin, if latex   Tizanidine  Other (See Comments)   Weakness, goofy, bad dreams        Medication List        Accurate as of March 23, 2024  2:39 PM. If you have any questions, ask your nurse or doctor.          3-in-1 Bedside Toilet Misc Length of need 99 months M17.11  Osteoarthritis right knee / multiple falls / high risk fall   acetaminophen  325 MG tablet Commonly known as: TYLENOL  Take 2 tablets (650 mg total) by mouth every 6 (six) hours as needed for mild pain (pain score 1-3) or fever (or Fever >/= 101).   ALPRAZolam  1 MG tablet Commonly known as: XANAX  Take 1 mg by mouth at bedtime as needed for sleep.   ARIPiprazole  2 MG tablet Commonly known as: ABILIFY  Take 1 tablet (2 mg total) by mouth at bedtime.   ascorbic acid 500 MG tablet Commonly known as: VITAMIN C  Take 0.5 tablets (250 mg total) by mouth 2 (two) times daily.   atorvastatin  80 MG tablet Commonly known as: LIPITOR Take 1 tablet (80 mg total) by mouth daily.   carbidopa -levodopa  10-100 MG tablet Commonly known as: Sinemet  Take 1 tablet by mouth 4 (four) times daily. For restless leg syndrome   colchicine  0.6 MG tablet Take 1 tablet (0.6 mg total) by mouth every 12 (twelve) hours as needed (gout flare.). For mouth sores   cyanocobalamin  1000 MCG tablet Commonly known as: VITAMIN B12 Take 1 tablet (1,000 mcg total) by mouth daily.   Dexcom G7 Sensor Misc CHANGE SENSOR EVERY 10 DAYS   diphenoxylate -atropine  2.5-0.025 MG tablet Commonly known as: LOMOTIL  TAKE 2 TABLETS BY MOUTH FOUR TIMES DAILY AS NEEDED FOR DIARRHEA OR LOOSE STOOLS   Eliquis  5 MG Tabs tablet Generic drug: apixaban  TAKE 1 TABLET TWICE A DAY   escitalopram  10 MG tablet Commonly known as: LEXAPRO  Take 10 mg by mouth daily.   ferrous sulfate  325 (65 FE) MG EC tablet Take 1 tablet (325 mg total) by mouth 2 (two) times daily.   furosemide  40 MG tablet Commonly known as: LASIX  Take 1 tablet (40 mg total) by mouth 2 (two) times daily.   gabapentin  100 MG capsule Commonly known as: NEURONTIN  Take 1 capsule (100 mg total) by mouth 3 (three) times daily.   hydrocortisone  2.5 % rectal cream Commonly known as: ANUSOL -HC Place rectally 2 (two) times daily.   hydrOXYzine  25 MG capsule Commonly known  as: VISTARIL  Take 1 capsule (25 mg total) by mouth every 8 (eight) hours as needed for anxiety.   insulin  lispro 100 UNIT/ML KwikPen Commonly known as: HumaLOG  KwikPen Inject 0-10 Units into the skin 2 (two) times daily before a meal. Sliding scale Short acting Humalog  insulin  per sliding scale 0-10 ---:--  Insulin  injection 0-10 Units 0-10 Units Subcutaneous, 3 times daily with meals CBG 70 - 120: 0 unit CBG 121 - 150: 0 unit  CBG 151 - 200: 1 unit CBG 201 - 250: 2 units CBG 251 - 300: 4 units CBG 301 - 350: 6 units  CBG 351 - 400: 8 units  CBG > 400: 10 units   Insulin  Pen Needle 29G X Misc 1 Device by Does not apply route daily in the afternoon.   Lantus  SoloStar 100 UNIT/ML Solostar Pen Generic drug: insulin  glargine Inject 10 Units into the skin at bedtime.   lidocaine  5 % Commonly known as: LIDODERM  Place 1 patch onto the skin daily. Remove & Discard patch within 12 hours or as directed by MD   lidocaine  5 % ointment Commonly known as: XYLOCAINE  Apply 1 Application topically 4 (four) times daily as needed. For foot and ankle nerve pain-  can use up to 4x/day as needed   losartan  25 MG tablet Commonly known as: COZAAR  Take 25 mg by mouth daily.   MAGNESIUM  PO Take 1 tablet by mouth daily.   Methocarbamol  1000 MG Tabs Take 1,000 mg by mouth every 6 (six) hours as needed.   metoprolol  tartrate 50 MG tablet Commonly known as: LOPRESSOR  Take 1 tablet (50 mg total) by mouth 2 (two) times daily.   ondansetron  4 MG tablet Commonly known as: Zofran  Take 1 tablet (4 mg total) by mouth every 8 (eight) hours as needed for nausea or vomiting. For cancer related nausea   OXcarbazepine  150 MG tablet Commonly known as: TRILEPTAL  Take 1 tablet (150 mg total) by mouth 2 (two) times daily.   oxyCODONE  20 mg 12 hr tablet Commonly known as: OxyCONTIN  Take 1 tablet (20 mg total) by mouth every 12 (twelve) hours. G89.3- chronic pain- fill 28 days after 04/23/2024   pantoprazole  40  MG tablet Commonly known as: PROTONIX  Take 1 tablet (40 mg total) by mouth 2 (two) times daily.   potassium chloride  10 MEQ tablet Commonly known as: KLOR-CON  Take 1 tablet (10 mEq total) by mouth daily.   pregabalin  25 MG capsule Commonly known as: Lyrica  Take 1 capsule (25 mg total) by mouth 3 (three) times daily. Will start at at bedtime x 4 days then BID x 4 days, then TID- for nerve pain   rOPINIRole  2 MG tablet Commonly known as: REQUIP  Take 1 tablet (2 mg total) by mouth at bedtime.   spironolactone  25 MG tablet Commonly known as: ALDACTONE  Take 0.5 tablets (12.5 mg total) by mouth daily.   tirzepatide  12.5 MG/0.5ML Pen Commonly known as: MOUNJARO  Inject 12.5 mg into the skin once a week.   Vitamin D  (Cholecalciferol ) 25 MCG (1000 UT) Tabs Take 1,000 Units by mouth in the morning.         OBJECTIVE:   Vital Signs: BP 130/70 (BP Location: Left Arm, Patient Position: Sitting, Cuff Size: Normal)   Pulse 88   Ht 5' 2 (1.575 m)   Wt 165 lb (74.8 kg)   SpO2 95%   BMI 30.18 kg/m   Wt Readings from Last 3 Encounters:  03/23/24 165 lb (74.8 kg)  03/17/24 162 lb (73.5 kg)  03/15/24 165 lb (74.8 kg)     Exam: General: Pt appears well and is in NAD  Lungs: Clear with good BS bilat   Heart: RRR   Neuro: MS is good with appropriate affect, pt is alert and Ox3    DM foot exam: 09/21/2023  The skin of the feet is without sores or ulcerations, left 2nd toe deformity  The pedal pulses are 1+ on right and 1+ on left. The sensation is intact to a screening 5.07, 10 gram monofilament bilaterally   DATA REVIEWED:  Lab Results  Component Value Date   HGBA1C 4.9 02/16/2024   HGBA1C 6.7 (A) 09/21/2023   HGBA1C 6.8 (H) 06/15/2023    Latest Reference Range & Units 03/02/24 10:38  Sodium 135 - 145 mmol/L 132 (L)  Potassium 3.5 - 5.1 mmol/L 4.0  Chloride 98 - 111 mmol/L 102  CO2 22 - 32 mmol/L 22  Glucose 70 - 99 mg/dL 851 (H)  BUN 8 - 23 mg/dL 6 (L)   Creatinine 9.55 - 1.00 mg/dL 9.46  Calcium  8.9 - 10.3 mg/dL 8.1 (L)  Anion gap 5 - 15  8  GFR, Estimated >60 mL/min >60  (L): Data is abnormally low (H): Data is abnormally high    Old records , labs and images have been reviewed.   ASSESSMENT / PLAN / RECOMMENDATIONS:   1) Type 2 Diabetes Mellitus, Optimally  controlled, Without complications - Most recent A1c of 4.9 %. Goal A1c < 7.0 %.    -A1c is skewed due to blood transfusion, Dexcom GMI 6.4% - Since her discharge she has been on less insulin  than the previously prescribed, patient continues with hypoglycemia overnight, I will decrease her Lantus  as below -We will continue on current dose of Mounjaro  - Patient has not been using standing dose of Humalog  with each meal, so she will continue to use correction scale as needed - Metformin  discontinued during hospitalization in 2025    MEDICATIONS:  -Decrease Lantus  10 units daily - Continue Mounjaro  12.5 mg weekly -Continue correction factor: Humalog  (BG -130/25) 3 times daily before every meal   EDUCATION / INSTRUCTIONS: BG monitoring instructions: Patient is instructed to check her blood sugars 3 times a day, before meals . Call [facility] if: BG persistently < 70 I reviewed the Rule of 15 for the treatment of hypoglycemia in detail with the patient. Literature supplied.   2) Diabetic complications:  Eye: Does not have known diabetic retinopathy.  Neuro/ Feet: Does not have known diabetic peripheral neuropathy .  Renal: Patient does not have known baseline CKD. She   is not on an ACEI/ARB at present.    F/U in 6 months     Signed electronically by: Stefano Redgie Butts, MD  [facility] Endocr[facility]ology  [facility] [address], [address] [place], [place] [address] Phone: [phone] FAX: [phone]   CC: [name], MD [address] [place] [address] Phone: [phone]  Fax: [phone]  Return to Endocrinology  clinic as below: Future Appointments  Date Time Provider Department Center  03/25/2024  1:15 PM Aura Mliss LABOR, RN [facility] None  03/28/2024  3:45 PM Elspeth Lacinda BROCKS, PT [facility] None  03/29/2024  2:30 PM [facility] CLINICAL SUPPORT [facility] [address]  04/05/2024  2:30 PM [facility] CLINICAL SUPPORT [facility] [address]  04/12/2024  2:35 PM [facility]DOD [facility] [address]  04/14/2024  1:45 PM Neda Jennet LABOR, MD [facility] [address]  04/18/2024  2:00 PM Eldonna Mays, MD [facility] None  04/21/2024  1:00 PM Urbano Albright, MD [facility] CPR  05/09/2024  1:40 PM Cornelio Bouchard, MD [facility] CPR  06/24/2024 12:40 PM [facility]-ANNUAL WELLNESS VISIT [facility] [address]  07/18/2024 10:00 AM Shannon Agent, MD [facility] None  09/13/2024  2:30 PM  Wertman, Sara E, PA-C [facility] None

## 2024-03-24 DIAGNOSIS — B351 Tinea unguium: Secondary | ICD-10-CM | POA: Diagnosis not present

## 2024-03-24 DIAGNOSIS — M79675 Pain in left toe(s): Secondary | ICD-10-CM | POA: Diagnosis not present

## 2024-03-24 DIAGNOSIS — E1142 Type 2 diabetes mellitus with diabetic polyneuropathy: Secondary | ICD-10-CM | POA: Diagnosis not present

## 2024-03-24 DIAGNOSIS — L84 Corns and callosities: Secondary | ICD-10-CM | POA: Diagnosis not present

## 2024-03-24 DIAGNOSIS — M79674 Pain in right toe(s): Secondary | ICD-10-CM | POA: Diagnosis not present

## 2024-03-25 ENCOUNTER — Encounter: Payer: Self-pay | Admitting: *Deleted

## 2024-03-25 ENCOUNTER — Other Ambulatory Visit: Payer: Self-pay | Admitting: *Deleted

## 2024-03-25 VITALS — Wt 160.0 lb

## 2024-03-25 DIAGNOSIS — I509 Heart failure, unspecified: Secondary | ICD-10-CM

## 2024-03-25 NOTE — Patient Outreach (Signed)
 Transition of Care week 5  Visit Note  03/25/2024  Name: Amber Stephenson MRN: [id number]          DOB: 12/03/1945  Situation: Patient enrolled in [facility] 30-day program. Visit completed with patient by telephone.   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   Benign paroxysmal positional vertigo due to bilateral vestibular disorder 05/20/2019   Bipolar 1 disorder (HCC) 01/23/2015   with GAD, benzo dependence   CAD (coronary artery disease)    Nonobstructive; Managed by Dr. Charls   Cardiomegaly 01/12/2018   CHF (congestive heart failure) 06/08/2022   Chronic back pain 01/04/2015   Chronic constipation 04/25/2020   Chronic diastolic heart failure (HCC) 05/30/2015   Chronic pain syndrome 08/22/2019   back pain, sacroiliitis   Chronic post-traumatic stress disorder (PTSD) 12/06/2020   Diabetic neuropathy (HCC) 02/06/2016   Dyslipidemia 09/24/2020   Essential hypertension    Functional diarrhea 10/26/2020   Herpes genitalis in women 07/16/2015   History of adenomatous polyp of colon 05/21/2019   Overview:   03/31/17: Colonoscopy: nonadvanced adenoma, microscopic colitis, f/u 5 yrs, [facility]/GAP   History of radiation therapy    Endometrial- 02/18/23-03/31/23-Dr. Lynwood Kinard   Hypotension 09/01/2022   Insomnia 01/23/2015   Metabolic acidosis 01/12/2023   Migraine headache with aura 02/12/2016   Myofascial pain dysfunction syndrome 08/22/2019   Non-alcoholic fatty liver disease 01/12/2018   OSA (obstructive sleep apnea) 02/24/2019   10/09/2018 - HST  - AHI 40.6    Osteopenia 12/06/2020   Rx alendronate  35 mg.    Pinguecula 12/06/2020   Presbyopia 12/06/2020   Pulmonary hypertension    RLS (restless legs syndrome) 04/27/2015   RSV (respiratory syncytial virus infection) 06/10/2023   Tremor, essential 12/11/2021   Type II diabetes mellitus (HCC) 09/24/2020    Assessment: Patient Reported Symptoms: Cognitive Cognitive Status: No symptoms reported, Able to follow simple commands, Alert and oriented to person, place, and time, Normal speech and language skills      Neurological Neurological Review of Symptoms: No symptoms reported    HEENT HEENT Symptoms Reported: No symptoms reported      Cardiovascular Cardiovascular Symptoms Reported: No symptoms reported Weight: 160 lb (72.6 kg) Cardiovascular Self-Management Outcome: 4 (good) Cardiovascular Comment: pt is weighing daily  Respiratory Respiratory Symptoms Reported: No symptoms reported    Endocrine Endocrine Symptoms Reported: No symptoms reported Is patient diabetic?: Yes Is patient checking blood sugars at home?: Yes List most recent blood sugar readings, include date and time of day: FBS today 126, pt saw endocrinologist on 03/23/24 Endocrine Self-Management Outcome: 4 (good) Endocrine Comment: reinforced carbohydrate modified diet  Gastrointestinal Gastrointestinal Symptoms Reported: No symptoms reported Additional Gastrointestinal Details: denies GI bleeding Gastrointestinal Management Strategies: Adequate rest Gastrointestinal Self-Management Outcome: 3 (uncertain) Gastrointestinal Comment: reinforced signs/ symptoms GI bleeding    Genitourinary Genitourinary Symptoms Reported: No symptoms reported    Integumentary Integumentary Symptoms Reported: Other Other Integumentary Symptoms: chronic sores in mouth x 4 months, pt is on vitamin B12 injections Skin Management Strategies: Adequate rest, Medication therapy Skin Self-Management Outcome: 3 (uncertain) Skin Comment: reinforced signs/ symptoms of infection  Musculoskeletal  Musculoskelatal Symptoms Reviewed: No symptoms reported  Psychosocial Psychosocial Symptoms Reported: No symptoms reported         There were no vitals filed for this visit.  Medications Reviewed Today     Reviewed by Aura Mliss LABOR, RN (Registered Nurse) on 03/25/24 at 1320  Med List Status: <None>   Medication Order Taking? Sig Documenting Provider Last Dose Status Informant  acetaminophen  (TYLENOL ) 325 MG tablet [id number]  Take 2 tablets (650 mg total) by mouth every 6 (six) hours as needed for mild pain (pain score 1-3) or fever (or Fever >/= 101). Pearlean Manus, MD  Active Spouse/Significant Other, Pharmacy Records  ALPRAZolam  (XANAX ) 1 MG tablet [id number]  Take 1 mg by mouth at bedtime as needed for sleep. [provider]  Active Spouse/Significant Other, Pharmacy Records  ARIPiprazole  (ABILIFY ) 2 MG tablet [id number]  Take 1 tablet (2 mg total) by mouth at bedtime. Pearlean Manus, MD  Active Spouse/Significant Other, Pharmacy Records  atorvastatin  (LIPITOR) 80 MG tablet [id number]  Take 1 tablet (80 mg total) by mouth daily. Pearlean Manus, MD  Active Spouse/Significant Other, Pharmacy Records  bupivacaine -epinephrine  (MARCAINE  W/ EPI) 0.5% -1:200000 injection [id number]   Hollis, Kevin D, MD  Active   carbidopa -levodopa  (SINEMET ) 10-100 MG tablet [id number]  Take 1 tablet by mouth 4 (four) times daily. For restless leg syndrome Pearlean Manus, MD  Active Spouse/Significant Other, Pharmacy Records  colchicine  0.6 MG tablet [id number]  Take 1 tablet (0.6 mg total) by mouth every 12 (twelve) hours as needed (gout flare.). For mouth sores Ricky Fines, MD  Active   Continuous Glucose Sensor (DEXCOM G7 SENSOR) [place] [id number]  CHANGE SENSOR EVERY 10 DAYS Shamleffer, Donell Cardinal, MD  Active Spouse/Significant Other, Pharmacy Records  cyanocobalamin  (VITAMIN B12) 1000 MCG tablet [id number]  Take 1 tablet (1,000 mcg total) by mouth daily. Raenelle Donalda HERO, MD  Active  Spouse/Significant Other, Pharmacy Records  cyanocobalamin  (VITAMIN B12) injection 1,000 mcg [id number]   [name], MD  Active   denosumab  (PROLIA ) injection 60 mg [id number]   [name], MD  Active   diphenoxylate -atropine  (LOMOTIL ) 2.5-0.025 MG tablet [id number]  TAKE 2 TABLETS BY MOUTH FOUR TIMES DAILY AS NEEDED FOR DIARRHEA OR LOOSE LEELAND [name], MD  Active Spouse/Significant Other, Pharmacy Records  ELIQUIS  5 MG TABS tablet [id number]  TAKE 1 TABLET TWICE A DAY Turner, Wilbert SAUNDERS, MD  Active Spouse/Significant Other, Pharmacy Records  escitalopram  (LEXAPRO ) 10 MG tablet [id number]  Take 10 mg by mouth daily. [provider]  Active Spouse/Significant Other, Pharmacy Records           Med Note (WARD, ANGELICA G   Tue Jan 12, 2024  2:28 PM)    ferrous sulfate  325 (65 FE) MG EC tablet [id number]  Take 1 tablet (325 mg total) by mouth 2 (two) times daily. Ricky Fines, MD  Active   furosemide  (LASIX ) 40 MG tablet [id number]  Take 1 tablet (40 mg total) by mouth 2 (two) times daily. Hayes Beckey CROME, NP  Active   gabapentin  (NEURONTIN ) 100 MG capsule [id number]  Take 1 capsule (100 mg total) by mouth 3 (three) times daily. Pearlean Manus, MD  Active Spouse/Significant Other, Pharmacy Records  hydrocortisone  (ANUSOL -HC) 2.5 % rectal cream [id number]  Place rectally 2 (two) times daily. Ricky Fines, MD  Active   hydrOXYzine  (VISTARIL ) 25 MG capsule [id number]  Take 1 capsule (25 mg total) by mouth every 8 (eight) hours as needed for anxiety. Pearlean Manus, MD  Active Spouse/Significant Other, Pharmacy Records  insulin  glargine (LANTUS  SOLOSTAR) 100 UNIT/ML  Solostar Pen [id number]  Inject 10 Units into the skin at bedtime. Pearlean Manus, MD  Active Spouse/Significant Other, Pharmacy Records  insulin  lispro (HUMALOG  KWIKPEN) 100 UNIT/ML KwikPen [id number]  Inject 0-10 Units into the skin 2 (two) times daily before a meal. Sliding scale Short acting Humalog  insulin  per sliding scale  0-10 ---:-- Insulin  injection 0-10 Units 0-10 Units Subcutaneous, 3 times daily with meals CBG 70 - 120: 0 unit CBG 121 - 150: 0 unit  CBG 151 - 200: 1 unit CBG 201 - 250: 2 units CBG 251 - 300: 4 units CBG 301 - 350: 6 units  CBG 351 - 400: 8 units  CBG > 400: 10 units Emokpae, Courage, MD  Active Spouse/Significant Other, Pharmacy Records  Insulin  Pen Needle 29G X MISC [id number]  1 Device by Does not apply route daily in the afternoon. Shamleffer, Donell Cardinal, MD  Active Spouse/Significant Other, Pharmacy Records  lidocaine  (LIDODERM ) 5 % [id number]  Place 1 patch onto the skin daily. Remove & Discard patch within 12 hours or as directed by MD Ricky Fines, MD  Active   lidocaine  (XYLOCAINE ) 5 % ointment [id number]  Apply 1 Application topically 4 (four) times daily as needed. For foot and ankle nerve pain-  can use up to 4x/day as needed Lovorn, Megan, MD  Active   losartan  (COZAAR ) 25 MG tablet [id number]  Take 25 mg by mouth daily. [provider]  Active Spouse/Significant Other, Pharmacy Records  MAGNESIUM  PO [id number]  Take 1 tablet by mouth daily. [provider]  Active Spouse/Significant Other, Pharmacy Records  Methocarbamol  1000 MG TABS [id number]  Take 1,000 mg by mouth every 6 (six) hours as needed. Lovorn, Megan, MD  Active Spouse/Significant Other, Pharmacy Records  metoprolol  tartrate (LOPRESSOR ) 50 MG tablet [id number]  Take 1 tablet (50 mg total) by mouth 2 (two) times daily. Pearlean Manus, MD  Active Spouse/Significant Other, Pharmacy Records  Misc. Devices (3-IN-1 BEDSIDE TOILET) MISC [id number]  Length of need 99 months M17.11 Osteoarthritis right knee / multiple falls / high risk fall Margrette Taft BRAVO, MD  Active Spouse/Significant Other, Pharmacy Records  ondansetron  (ZOFRAN ) 4 MG tablet [id number]  Take 1 tablet (4 mg total) by mouth every 8 (eight) hours as needed for nausea or vomiting. For cancer related nausea Dettinger, Fonda LABOR, MD  Active  Spouse/Significant Other, Pharmacy Records  OXcarbazepine  (TRILEPTAL ) 150 MG tablet [id number]  Take 1 tablet (150 mg total) by mouth 2 (two) times daily. Sheikh, Omair Bolt, DO  Active Spouse/Significant Other, Pharmacy Records  oxyCODONE  (OXYCONTIN ) 20 mg 12 hr tablet [id number]  Take 1 tablet (20 mg total) by mouth every 12 (twelve) hours. G89.3- chronic pain- fill 28 days after 04/23/2024 Debby Fidela CROME, NP  Active   pantoprazole  (PROTONIX ) 40 MG tablet [id number]  Take 1 tablet (40 mg total) by mouth 2 (two) times daily. Pearlean Manus, MD  Active Spouse/Significant Other, Pharmacy Records  potassium chloride  (KLOR-CON ) 10 MEQ tablet [id number]  Take 1 tablet (10 mEq total) by mouth daily. Shlomo Wilbert SAUNDERS, MD  Active Spouse/Significant Other, Pharmacy Records  pregabalin  (LYRICA ) 25 MG capsule [id number]  Take 1 capsule (25 mg total) by mouth 3 (three) times daily. Will start at at bedtime x 4 days then BID x 4 days, then TID- for nerve pain Lovorn, Megan, MD  Active   rOPINIRole  (REQUIP ) 2 MG tablet [id number]  Take 1 tablet (2 mg total) by mouth at bedtime. [name], MD  Active Spouse/Significant Other, Pharmacy Records  spironolactone  (ALDACTONE ) 25 MG tablet [id number]  Take 0.5 tablets (12.5 mg total) by mouth daily. Hayes Beckey CROME, NP  Active   tirzepatide  (MOUNJARO ) 12.5 MG/0.5ML Pen [id number]  Inject 12.5 mg into the skin once a week. Shamleffer, Ibtehal Jaralla, MD  Active   vitamin C  (VITAMIN C ) 500 MG tablet [id number]  Take 0.5 tablets (250 mg total) by mouth 2 (two) times daily. Ricky Fines, MD  Active   Vitamin D , Cholecalciferol , 25 MCG (1000 UT) TABS [id number]  Take 1,000 Units by mouth in the morning. [provider]  Active Spouse/Significant Other, Pharmacy Records  Med List Note (Ward, Angelica, CPhT 01/12/24 1617): [employer] insurance, pt's spouse assists with meds            Goals Addressed             This Visit's Progress    COMPLETED: VBCI Transitions  of Care (TOC) Care Plan       Problems:  Recent Hospitalization for treatment of CHF No Specialist appointment pt reports she has contact # for GI, will call to schedule Pt lives with spouse, pt is working with [facility] Pt reports she has chronic GI bleeding due to 2 bleeding ulcers, denies any bleeding today Pt has finished antibiotic for UTI Pt declined home health, will be starting outpatient therapy on 03/08/24 03/25/24- Saw endocrinologist 03/23/24, pt is receiving B12 injection and will be going weekly- pt reports this is for mouth sores she has had x 4 months, denies any GI bleeding today CBG fasting 126 today, weight 160 pounds, no new concerns reported  Goal:  Over the next 30 days, the patient will not experience hospital readmission  Interventions:  Heart Failure Interventions: Provided education on low sodium diet Discussed importance of daily weight and advised patient to weigh and record daily Discussed the importance of keeping all appointments with provider Reinforced Heart Failure action plan Reinforced signs/ symptoms UTI, prevention Reinforced signs /symptoms GI bleeding, action plan, call provider for any increase in bleeding, seek emergency assistance as needed Reviewed all upcoming scheduled appointments Encouraged small frequent meals with adequate protein Reinforced carbohydrate modified diet Reviewed correlation of elevated CBG due to infection Reviewed signs /symptoms hypoglycemia, actions to take and importance of eating 3 meals per day and not skipping any meals Pain assessment completed Reinforced signs /symptoms of infection Reviewed plan of care with pt including TOC case closure, pt is agreeable to transfer to longitudinal case manager, order placed  Patient Self Care Activities:  Attend all scheduled provider appointments Attend church or other social activities Call pharmacy for medication refills 3-7 days in advance of running out of  medications Call provider office for new concerns or questions  Notify RN Care Manager of TOC call rescheduling needs Participate in Transition of Care Program/Attend TOC scheduled calls Take medications as prescribed   call office if I gain more than 2 pounds in one day or 5 pounds in one week keep legs up while sitting watch for swelling in feet, ankles and legs every day weigh myself daily develop a rescue plan follow rescue plan if symptoms flare-up eat more whole grains, fruits and vegetables, lean meats and healthy fats track symptoms and what helps feel better or worse dress right for the weather, hot or cold Eat small, frequent meals, include adequate protein at each meal Please follow carbohydrate modified diet Report any increase in GI bleeding Eat 3 meals per day, do not skip meals Care guide will call  you to schedule phone appointment with RN Case Manager for ongoing management Follow RULE OF 15 for low blood sugar management:  How to treat low blood sugars (Blood sugar less than 70 mg/dl  Please follow the RULE OF 15 for the treatment of hypoglycemia treatment (When your blood sugars are less than 70 mg/ dl) STEP  1:  Take 15 grams of carbohydrates when your blood sugar is low, which includes One tube of glucose gel STEP 2:  Recheck blood sugar in 15 minutes STEP 3:  3-4 glucose tabs or  3-4 oz of juice or regular soda or If your blood sugar is still low at the 15 minute recheck ---then, go back to STEP 1 and treat again with another 15 grams of carbohydrates  Plan:  The care management team will reach out to the patient again over the next 30 days. The patient has been provided with contact information for the care management team and has been advised to call with any health related questions or concerns.         Recommendation:   PCP Follow-up  Follow Up Plan:   Referral to RN Case Manager Closing From:  Transitions of Care Program  Mliss Creed [facility], BSN RN  Care Manager/ Transition of Care [place]/ [facility] Population Health [phone]

## 2024-03-25 NOTE — Patient Instructions (Signed)
 Visit Information  Thank you for taking time to visit with me today. Please don't hesitate to contact me if I can be of assistance to you before our next scheduled telephone appointment.   Following is a copy of your care plan:   Goals Addressed             This Visit's Progress    COMPLETED: VBCI Transitions of Care (TOC) Care Plan       Problems:  Recent Hospitalization for treatment of CHF No Specialist appointment pt reports she has contact # for GI, will call to schedule Pt lives with spouse, pt is working with [facility] Pt reports she has chronic GI bleeding due to 2 bleeding ulcers, denies any bleeding today Pt has finished antibiotic for UTI Pt declined home health, will be starting outpatient therapy on 03/08/24 03/25/24- Saw endocrinologist 03/23/24, pt is receiving B12 injection and will be going weekly- pt reports this is for mouth sores she has had x 4 months, denies any GI bleeding today CBG fasting 126 today, weight 160 pounds, no new concerns reported  Goal:  Over the next 30 days, the patient will not experience hospital readmission  Interventions:  Heart Failure Interventions: Provided education on low sodium diet Discussed importance of daily weight and advised patient to weigh and record daily Discussed the importance of keeping all appointments with provider Reinforced Heart Failure action plan Reinforced signs/ symptoms UTI, prevention Reinforced signs /symptoms GI bleeding, action plan, call provider for any increase in bleeding, seek emergency assistance as needed Reviewed all upcoming scheduled appointments Encouraged small frequent meals with adequate protein Reinforced carbohydrate modified diet Reviewed correlation of elevated CBG due to infection Reviewed signs /symptoms hypoglycemia, actions to take and importance of eating 3 meals per day and not skipping any meals Pain assessment completed Reinforced signs /symptoms of  infection Reviewed plan of care with pt including TOC case closure, pt is agreeable to transfer to longitudinal case manager, order placed  Patient Self Care Activities:  Attend all scheduled provider appointments Attend church or other social activities Call pharmacy for medication refills 3-7 days in advance of running out of medications Call provider office for new concerns or questions  Notify RN Care Manager of TOC call rescheduling needs Participate in Transition of Care Program/Attend TOC scheduled calls Take medications as prescribed   call office if I gain more than 2 pounds in one day or 5 pounds in one week keep legs up while sitting watch for swelling in feet, ankles and legs every day weigh myself daily develop a rescue plan follow rescue plan if symptoms flare-up eat more whole grains, fruits and vegetables, lean meats and healthy fats track symptoms and what helps feel better or worse dress right for the weather, hot or cold Eat small, frequent meals, include adequate protein at each meal Please follow carbohydrate modified diet Report any increase in GI bleeding Eat 3 meals per day, do not skip meals Care guide will call you to schedule phone appointment with RN Case Manager for ongoing management Follow RULE OF 15 for low blood sugar management:  How to treat low blood sugars (Blood sugar less than 70 mg/dl  Please follow the RULE OF 15 for the treatment of hypoglycemia treatment (When your blood sugars are less than 70 mg/ dl) STEP  1:  Take 15 grams of carbohydrates when your blood sugar is low, which includes One tube of glucose gel STEP 2:  Recheck blood sugar in  15 minutes STEP 3:  3-4 glucose tabs or  3-4 oz of juice or regular soda or If your blood sugar is still low at the 15 minute recheck ---then, go back to STEP 1 and treat again with another 15 grams of carbohydrates  Plan:  The care management team will reach out to the patient again over the next 30  days. The patient has been provided with contact information for the care management team and has been advised to call with any health related questions or concerns.         Care plan and visit instructions communicated with the patient verbally today. Patient agrees to receive a copy in MyChart. Active MyChart status and patient understanding of how to access instructions and care plan via MyChart confirmed with patient.      Please call the care guide team at [phone] if you need to cancel or reschedule your appointment.   Please call the Suicide and Crisis Lifeline: 988 call the [place]  National Suicide Prevention Lifeline: 731-704-6370 or TTY: (573) 013-1960 TTY (445)707-3682) to talk to a trained counselor call 1-800-273-TALK (toll free, 24 hour hotline) go to [facility] [address] [place] [phone]) call the [facility] Crisis Line: [phone] call 911 if you are experiencing a Mental Health or Behavioral Health Crisis or need someone to talk to.  Mliss Creed [facility], BSN RN Care Manager/ Transition of Care [place]/ [facility] [phone]

## 2024-03-28 ENCOUNTER — Ambulatory Visit

## 2024-03-28 ENCOUNTER — Ambulatory Visit (HOSPITAL_COMMUNITY): Attending: Orthopedic Surgery

## 2024-03-28 DIAGNOSIS — R2681 Unsteadiness on feet: Secondary | ICD-10-CM | POA: Insufficient documentation

## 2024-03-28 DIAGNOSIS — R269 Unspecified abnormalities of gait and mobility: Secondary | ICD-10-CM | POA: Insufficient documentation

## 2024-03-28 DIAGNOSIS — M6281 Muscle weakness (generalized): Secondary | ICD-10-CM | POA: Diagnosis not present

## 2024-03-28 DIAGNOSIS — S8001XA Contusion of right knee, initial encounter: Secondary | ICD-10-CM | POA: Diagnosis not present

## 2024-03-28 DIAGNOSIS — M1711 Unilateral primary osteoarthritis, right knee: Secondary | ICD-10-CM | POA: Insufficient documentation

## 2024-03-28 NOTE — Therapy (Signed)
 OUTPATIENT PHYSICAL THERAPY LOWER EXTREMITY EVALUATION   Patient Name: Amber Stephenson MRN: [id number] DOB:08-04-1945, 78 y.o., female Today's Date: 03/28/2024  END OF SESSION:  PT End of Session - 03/28/24 1547     Visit Number 1    Number of Visits 8    Date for Recertification  05/27/24    Authorization Type Medicare with [employer] for life secondary    Authorization Time Period no authorization required    PT Start Time 1548    PT Stop Time 1623    PT Time Calculation (min) 35 min    Activity Tolerance Patient limited by pain    Behavior During Therapy [facility] for tasks assessed/performed          Past Medical History:  Diagnosis Date   Allergic rhinitis 02/24/2019   Ambulatory dysfunction 04/23/2020   Atrial fibrillation with RVR (HCC) 05/19/2017   Back pain/sacroiliitis--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   Benign paroxysmal positional vertigo due to bilateral vestibular disorder 05/20/2019   Bipolar 1 disorder (HCC) 01/23/2015   with GAD, benzo dependence   CAD (coronary artery disease)    Nonobstructive; Managed by Dr. Charls   Cardiomegaly 01/12/2018   CHF (congestive heart failure) 06/08/2022   Chronic back pain 01/04/2015   Chronic constipation 04/25/2020   Chronic diastolic heart failure (HCC) 05/30/2015   Chronic pain syndrome 08/22/2019   back pain, sacroiliitis   Chronic post-traumatic stress disorder (PTSD) 12/06/2020   Diabetic neuropathy (HCC) 02/06/2016   Dyslipidemia 09/24/2020   Essential hypertension    Functional diarrhea 10/26/2020   Herpes genitalis in women 07/16/2015   History of adenomatous polyp of colon 05/21/2019   Overview:   03/31/17: Colonoscopy: nonadvanced adenoma, microscopic colitis, f/u 5 yrs, Murphy/GAP   History of radiation therapy    Endometrial- 02/18/23-03/31/23-Dr. Lynwood Kinard   Hypotension 09/01/2022   Insomnia  01/23/2015   Metabolic acidosis 01/12/2023   Migraine headache with aura 02/12/2016   Myofascial pain dysfunction syndrome 08/22/2019   Non-alcoholic fatty liver disease 01/12/2018   OSA (obstructive sleep apnea) 02/24/2019   10/09/2018 - HST  - AHI 40.6    Osteopenia 12/06/2020   Rx alendronate  35 mg.   Pinguecula 12/06/2020   Presbyopia 12/06/2020   Pulmonary hypertension    RLS (restless legs syndrome) 04/27/2015   RSV (respiratory syncytial virus infection) 06/10/2023   Tremor, essential 12/11/2021   Type II diabetes mellitus (HCC) 09/24/2020   Past Surgical History:  Procedure Laterality Date   BREAST REDUCTION SURGERY     COLONOSCOPY  2018   6 mm cecal tubular adenoma, sigmoid diverticulosis, random colon biopsies consistent with microscopic colitis   COLONOSCOPY N/A 01/22/2024   Procedure: COLONOSCOPY;  Surgeon: Eartha Angelia Sieving, MD;  Location: [facility];  Service: Gastroenterology;  Laterality: N/A;   COLONOSCOPY N/A 02/23/2024   Procedure: COLONOSCOPY;  Surgeon: Eartha Angelia Sieving, MD;  Location: [facility];  Service: Gastroenterology;  Laterality: N/A;   ESOPHAGOGASTRODUODENOSCOPY N/A 01/22/2024   Procedure: EGD (ESOPHAGOGASTRODUODENOSCOPY);  Surgeon: Eartha Angelia, Sieving, MD;  Location: [facility];  Service: Gastroenterology;  Laterality: N/A;   ESOPHAGOGASTRODUODENOSCOPY N/A 02/23/2024   Procedure: EGD (ESOPHAGOGASTRODUODENOSCOPY);  Surgeon: Eartha Angelia, Sieving, MD;  Location: [facility];  Service: Gastroenterology;  Laterality: N/A;   EYE SURGERY Right    cateracts   HAMMER TOE SURGERY     LEFT HEART CATH AND CORONARY ANGIOGRAPHY N/A  02/03/2018   Procedure: LEFT HEART CATH AND CORONARY ANGIOGRAPHY;  Surgeon: Jordan, Peter M, MD;  Location: [facility] INVASIVE CV LAB;  Service: Cardiovascular;  Laterality: N/A;   REVERSE SHOULDER ARTHROPLASTY Right 08/19/2019   Procedure: REVERSE SHOULDER ARTHROPLASTY;  Surgeon: Kay Kemps, MD;  Location:  [facility];  Service: Orthopedics;  Laterality: Right;  interscalene block   SHOULDER SURGERY Right    I BROKE MY SHOUDLER   THIGH SURGERY     TO REMOVE A TUMOR    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 02/04/2024   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   Osteopenia 02/04/2024   Pain of foot 02/04/2024   Pain of right breast 02/04/2024   Phase of life problem 02/04/2024   Acute gastric ulcer without hemorrhage or perforation 01/23/2024   Hematochezia 01/21/2024   Acute anemia 01/21/2024   ABLA (acute blood loss anemia) 01/19/2024   Essential hypertension 01/07/2024   Rectal bleeding 10/07/2023   Nocturnal enuresis 08/25/2023   Hyponatremia 06/10/2023   Abdominal pain 06/10/2023   Diarrhea 06/10/2023   Candidiasis of mouth 05/15/2023   Type 2 diabetes mellitus with hyperglycemia, with long-term current use of insulin  (HCC) 02/12/2023   Long term (current) use of oral hypoglycemic drugs 02/12/2023   Primary hypertension 01/14/2023   Endometrial cancer (HCC) 01/12/2023   Vulvar itching 12/22/2022   Burning with urination 11/13/2022   Vitamin B12 deficiency 11/09/2022   Frequent falls 09/01/2022   AKI (acute kidney injury) 06/27/2022   Hemorrhoids 03/11/2022   Microalbuminuria 01/21/2022   Tremor, essential 12/11/2021   Chronic bilateral low back pain with right-sided sciatica 07/19/2021   Vaginal itching 04/16/2021   Sensory ataxia 03/28/2021   Elevated troponin 12/14/2020   Chronic post-traumatic stress disorder (PTSD) 12/06/2020   Senile corneal changes 12/06/2020   Postmenopausal bleeding 12/06/2020   Acquired hammer toe 12/06/2020   Opioid dependence 12/06/2020    Hypermetropia 12/06/2020   Generalized weakness 12/06/2020   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   Generalized anxiety disorder 12/06/2020   Functional diarrhea 10/26/2020   Insulin  dependent type 2 diabetes mellitus (HCC) 09/24/2020   Chronic respiratory failure with hypoxia (HCC) 04/25/2020   Chronic constipation 04/25/2020   Chronic pain syndrome 08/22/2019   CAD (coronary artery disease)    Hypocalcemia    S/P shoulder replacement, 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   Mixed hyperlipidemia 05/20/2019   Vertigo 04/25/2019   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   OSA (obstructive sleep apnea) 02/24/2019   At risk for adverse drug event 07/06/2018   Non-alcoholic fatty liver disease 01/12/2018   Mild intermittent asthma 01/12/2018   Senile osteoporosis 12/15/2017   Atrial fibrillation with RVR (HCC) 05/19/2017   Migraine headache with aura 02/12/2016   Diabetic neuropathy (HCC) 02/06/2016   Bipolar 1 disorder (HCC) 10/04/2015   Major depressive disorder 10/03/2015   Herpes genitalis in women 07/16/2015   Long term current use of anticoagulant 05/30/2015   Chronic heart failure with preserved ejection fraction (HFpEF) (HCC) 05/30/2015   RLS (restless legs syndrome) 04/27/2015   Insomnia 01/23/2015   Chronic atrial fibrillation with RVR (HCC) 01/04/2015    PCP: [name], MD  REFERRING PROVIDER: Margrette Taft BRAVO, MD   REFERRING DIAG: Gait disturbance,  Contusion of right knee, initial encounter, Primary osteoarthritis of right knee   THERAPY DIAG:  Unsteadiness on feet  Muscle weakness (generalized)  Rationale for Evaluation and Treatment: Rehabilitation  ONSET DATE: November 2024  SUBJECTIVE:   SUBJECTIVE STATEMENT: Patient reports that her balance is her primary concern as her right knee has gotten better. She feels that her balance has been getting worse since November 2024. She has tried using a cane and a walker, but she does not feel steady or safe using an  assistive device. She is primarily a furniture walker for balance. She has fallen 3-4 times in the past 6 months. She notes that she has had multiple colonoscopies over the previous months due to abdominal pain. She has been having a lot of pain over the past few days and she is scheduled for another colonoscopy in December.   PERTINENT HISTORY: Atrial fibrillation, CHF, diabetic neuropathy, osteoporosis, bipolar 1 disorder, asthma, PTSD, anxiety, and history of cancer PAIN:  Are you having pain? Yes: NPRS scale: 7/10 Pain location: back and left knee  Pain description: sharp Aggravating factors: walking Relieving factors: rest and elevation  PRECAUTIONS: Fall  RED FLAGS: None   WEIGHT BEARING RESTRICTIONS: No  FALLS:  Has patient fallen in last 6 months? Yes. Number of falls 3-4  LIVING ENVIRONMENT: Lives with: lives with their spouse Lives in: House/apartment Stairs: Yes: Internal: 1 flight steps; on right going up; patient avoids these steps due to difficulty and instability Has following equipment at home: Quad cane small base, Walker - 4 wheeled, shower chair, and Grab bars  OCCUPATION: retired  PLOF: Independent  PATIENT GOALS: be able to go shopping and improved safety  NEXT MD VISIT: none scheduled  OBJECTIVE:  Note: Objective measures were completed at Evaluation unless otherwise noted.  DIAGNOSTIC FINDINGS: 01/29/24 right knee x-ray IMPRESSION: Moderate prepatellar soft tissue swelling is noted suggesting traumatic injury. No fracture or dislocation is noted.  PATIENT SURVEYS:  ABC scale: The Activities-Specific Balance Confidence (ABC) Scale 0% 10 20 30  40 50 60 70 80 90 100% No confidence<->completely confident  "How confident are you that you will not lose your balance or become unsteady when you . . .   Date tested 03/28/24  Walk around the house 20%  2. Walk up or down stairs 10%  3. Bend over and pick up a slipper from in front of a closet floor 10%   4. Reach for a small can off a shelf at eye level 80%  5. Stand on tip toes and reach for something above your head 40%  6. Stand on a chair and reach for something 0%  7. Sweep the floor 0%  8. Walk outside the house to a car parked in the driveway 50%  9. Get into or out of a car 60%  10. Walk across a parking lot to the mall 0%  11. Walk up or down a ramp 40%  12. Walk in a crowded mall where people rapidly walk past you 10%  13. Are bumped into by people as you walk through the mall 0%  14. Step onto or off of an escalator while you are holding onto the railing 40%  15. Step onto or off an escalator while holding onto parcels such that you cannot hold onto the railing 0%  16. Walk outside on icy sidewalks 0%  Total: #/16 22.5%      COGNITION: Overall cognitive status: Within functional limits for tasks assessed  SENSATION: Light touch: Impaired  and increased sensitivity to light touch in bilateral lower 1/3 of the tibia Patient reports numbness or tingling in her feet and lower legs.   EDEMA:  No edema observed  POSTURE: rounded shoulders, forward head, and flexed trunk   LOWER EXTREMITY ROM: unable to assess secondary to pain   LOWER EXTREMITY MMT: high pain severity and irritability with MMT which limited full assessment   MMT Right eval Left eval  Hip flexion 4-/5 3+/5  Hip extension    Hip abduction    Hip adduction    Hip internal rotation    Hip external rotation    Knee flexion    Knee extension    Ankle dorsiflexion 4-/5 4-/5  Ankle plantarflexion    Ankle inversion    Ankle eversion     (Blank rows = not tested)  FUNCTIONAL TESTS: will be completed a following appointments, as able 5 times sit to stand:   Timed up and go (TUG):   2 minute walk test:    GAIT: Assistive device utilized: None Level of assistance: SBA Comments: very unsteady with ambulation, required external support from the wall and other objects for safety                                                                                                                                 TREATMENT DATE:   03/28/24: PT evaluation and patient education  PATIENT EDUCATION:  Education details: POC, prognosis, healing, objective testing, using a AD for safety, and goals for physical therapy Person educated: Patient Education method: Explanation Education comprehension: verbalized understanding  HOME EXERCISE PROGRAM:   ASSESSMENT:  CLINICAL IMPRESSION: Patient is a 78 y.o. female who was seen today for physical therapy evaluation and treatment for unsteadiness on her feet and history of falling. She is a high fall risk as evidenced by her gait mechanics and her history of falling. Her full objective assessment was unable to be completed due to her high pain severity and irritability. She requested to leave early secondary to her pain levels. Recommend that she continue with skilled physical therapy to address her impairments to maximize her safety and functional mobility.    OBJECTIVE IMPAIRMENTS: Abnormal gait, decreased activity tolerance, decreased balance, decreased knowledge of use of DME, decreased mobility, difficulty walking, decreased strength, postural dysfunction, and pain.   ACTIVITY LIMITATIONS: carrying, bending, standing, stairs, transfers, and locomotion level  PARTICIPATION LIMITATIONS: meal prep, cleaning, laundry, shopping, and community activity  PERSONAL FACTORS: Past/current experiences, Time since onset of injury/illness/exacerbation, and 3+ comorbidities: Atrial fibrillation, CHF, diabetic neuropathy, osteoporosis, bipolar 1 disorder, asthma, PTSD, anxiety, and history of cancer are also affecting patient's functional outcome.   REHAB POTENTIAL: Fair    CLINICAL DECISION MAKING: Unstable/unpredictable  EVALUATION COMPLEXITY: High   GOALS: Goals reviewed with patient? No  LONG TERM GOALS: Target date: 04/25/24  Patient will be  independent with her HEP.  Baseline:  Goal status: INITIAL  2.  Patient will improve her ABC by at least 10% for improved perceived function with her daily activities.  Baseline:  Goal status: INITIAL  3.  Patient will be able to complete the timed up and go in 12 seconds or less to reduce her fall risk.  Baseline:  Goal status: INITIAL  4.  Patient will improve her timed up and go to 12 seconds or less to reduce her fall risk.  Baseline:  Goal status: INITIAL  5.  Patient will be able to demonstrate proper utilization of the least restrictive assistive device to improve her community mobility.  Baseline:  Goal status: INITIAL  PLAN:  PT FREQUENCY: 2x/week  PT DURATION: 4 weeks  PLANNED INTERVENTIONS: [id number]- PT Re-evaluation, 97750- Physical Performance Testing, 97110-Therapeutic exercises, 97530- Therapeutic activity, [id number]- Neuromuscular re-education, 97535- Self Care, [id number]- Manual therapy, [phone]- Gait training, Patient/Family education, Balance training, Stair training, Cryotherapy, and Moist heat  PLAN FOR NEXT SESSION: 2 MWT, TUG, 5x STS, lower extremity strength testing, gait training, balance interventions, and lower extremity strengthening, and provide HEP   Lacinda JAYSON Fass, PT 03/28/2024, 5:26 PM

## 2024-03-29 ENCOUNTER — Ambulatory Visit: Admitting: *Deleted

## 2024-03-29 DIAGNOSIS — E538 Deficiency of other specified B group vitamins: Secondary | ICD-10-CM | POA: Diagnosis not present

## 2024-03-29 DIAGNOSIS — K1379 Other lesions of oral mucosa: Secondary | ICD-10-CM

## 2024-03-29 NOTE — Progress Notes (Signed)
 Patient is in office today for a nurse visit for B12 Injection.  Injection was given in the  Right deltoid. Patient tolerated injection well.

## 2024-03-30 ENCOUNTER — Other Ambulatory Visit: Payer: Self-pay

## 2024-03-30 DIAGNOSIS — D2272 Melanocytic nevi of left lower limb, including hip: Secondary | ICD-10-CM | POA: Diagnosis not present

## 2024-03-30 DIAGNOSIS — D485 Neoplasm of uncertain behavior of skin: Secondary | ICD-10-CM | POA: Diagnosis not present

## 2024-03-30 DIAGNOSIS — S80861A Insect bite (nonvenomous), right lower leg, initial encounter: Secondary | ICD-10-CM | POA: Diagnosis not present

## 2024-03-30 MED ORDER — DEXCOM G7 SENSOR MISC: 3 refills | Status: AC

## 2024-04-04 ENCOUNTER — Ambulatory Visit (HOSPITAL_COMMUNITY): Attending: Orthopedic Surgery

## 2024-04-04 ENCOUNTER — Encounter (HOSPITAL_COMMUNITY): Payer: Self-pay

## 2024-04-04 DIAGNOSIS — M6281 Muscle weakness (generalized): Secondary | ICD-10-CM | POA: Diagnosis not present

## 2024-04-04 DIAGNOSIS — R2681 Unsteadiness on feet: Secondary | ICD-10-CM | POA: Insufficient documentation

## 2024-04-04 NOTE — Therapy (Signed)
 OUTPATIENT PHYSICAL THERAPY LOWER EXTREMITY TREATMENT   Patient Name: Amber Stephenson MRN: [id number] DOB:1946/01/21, 78 y.o., female Today's Date: 04/04/2024  END OF SESSION:  PT End of Session - 04/04/24 1542     Visit Number 2    Number of Visits 8    Date for Recertification  05/27/24    Authorization Type Medicare part A and B, with [employer] for life secondary    Authorization Time Period no authorization required    PT Start Time 1548    PT Stop Time 1630    PT Time Calculation (min) 42 min    Activity Tolerance Patient limited by pain    Behavior During Therapy [facility] for tasks assessed/performed          Past Medical History:  Diagnosis Date   Allergic rhinitis 02/24/2019   Ambulatory dysfunction 04/23/2020   Atrial fibrillation with RVR (HCC) 05/19/2017   Back pain/sacroiliitis--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   Benign paroxysmal positional vertigo due to bilateral vestibular disorder 05/20/2019   Bipolar 1 disorder (HCC) 01/23/2015   with GAD, benzo dependence   CAD (coronary artery disease)    Nonobstructive; Managed by Dr. Charls   Cardiomegaly 01/12/2018   CHF (congestive heart failure) 06/08/2022   Chronic back pain 01/04/2015   Chronic constipation 04/25/2020   Chronic diastolic heart failure (HCC) 05/30/2015   Chronic pain syndrome 08/22/2019   back pain, sacroiliitis   Chronic post-traumatic stress disorder (PTSD) 12/06/2020   Diabetic neuropathy (HCC) 02/06/2016   Dyslipidemia 09/24/2020   Essential hypertension    Functional diarrhea 10/26/2020   Herpes genitalis in women 07/16/2015   History of adenomatous polyp of colon 05/21/2019   Overview:   03/31/17: Colonoscopy: nonadvanced adenoma, microscopic colitis, f/u 5 yrs, Murphy/GAP   History of radiation therapy    Endometrial- 02/18/23-03/31/23-Dr. Lynwood Kinard   Hypotension 09/01/2022    Insomnia 01/23/2015   Metabolic acidosis 01/12/2023   Migraine headache with aura 02/12/2016   Myofascial pain dysfunction syndrome 08/22/2019   Non-alcoholic fatty liver disease 01/12/2018   OSA (obstructive sleep apnea) 02/24/2019   10/09/2018 - HST  - AHI 40.6    Osteopenia 12/06/2020   Rx alendronate  35 mg.   Pinguecula 12/06/2020   Presbyopia 12/06/2020   Pulmonary hypertension    RLS (restless legs syndrome) 04/27/2015   RSV (respiratory syncytial virus infection) 06/10/2023   Tremor, essential 12/11/2021   Type II diabetes mellitus (HCC) 09/24/2020   Past Surgical History:  Procedure Laterality Date   BREAST REDUCTION SURGERY     COLONOSCOPY  2018   6 mm cecal tubular adenoma, sigmoid diverticulosis, random colon biopsies consistent with microscopic colitis   COLONOSCOPY N/A 01/22/2024   Procedure: COLONOSCOPY;  Surgeon: Eartha Angelia Sieving, MD;  [facility];  Service: Gastroenterology;  Laterality: N/A;   COLONOSCOPY N/A 02/23/2024   Procedure: COLONOSCOPY;  Surgeon: Eartha Angelia Sieving, MD;  Location: [facility];  Service: Gastroenterology;  Laterality: N/A;   ESOPHAGOGASTRODUODENOSCOPY N/A 01/22/2024   Procedure: EGD (ESOPHAGOGASTRODUODENOSCOPY);  Surgeon: Eartha Angelia, Sieving, MD;  Location: [facility];  Service: Gastroenterology;  Laterality: N/A;   ESOPHAGOGASTRODUODENOSCOPY N/A 02/23/2024   Procedure: EGD (ESOPHAGOGASTRODUODENOSCOPY);  Surgeon: Eartha Angelia, Sieving, MD;  Location: [facility];  Service: Gastroenterology;  Laterality: N/A;   EYE SURGERY Right    cateracts   HAMMER TOE SURGERY     LEFT HEART CATH  AND CORONARY ANGIOGRAPHY N/A 02/03/2018   Procedure: LEFT HEART CATH AND CORONARY ANGIOGRAPHY;  Surgeon: Jordan, Peter M, MD;  Location: [facility] INVASIVE CV LAB;  Service: Cardiovascular;  Laterality: N/A;   REVERSE SHOULDER ARTHROPLASTY Right 08/19/2019   Procedure: REVERSE SHOULDER ARTHROPLASTY;  Surgeon: Kay Kemps, MD;   Location: [facility];  Service: Orthopedics;  Laterality: Right;  interscalene block   SHOULDER SURGERY Right    I BROKE MY SHOUDLER   THIGH SURGERY     TO REMOVE A TUMOR    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 02/04/2024   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   Osteopenia 02/04/2024   Pain of foot 02/04/2024   Pain of right breast 02/04/2024   Phase of life problem 02/04/2024   Acute gastric ulcer without hemorrhage or perforation 01/23/2024   Hematochezia 01/21/2024   Acute anemia 01/21/2024   ABLA (acute blood loss anemia) 01/19/2024   Essential hypertension 01/07/2024   Rectal bleeding 10/07/2023   Nocturnal enuresis 08/25/2023   Hyponatremia 06/10/2023   Abdominal pain 06/10/2023   Diarrhea 06/10/2023   Candidiasis of mouth 05/15/2023   Type 2 diabetes mellitus with hyperglycemia, with long-term current use of insulin  (HCC) 02/12/2023   Long term (current) use of oral hypoglycemic drugs 02/12/2023   Primary hypertension 01/14/2023   Endometrial cancer (HCC) 01/12/2023   Vulvar itching 12/22/2022   Burning with urination 11/13/2022   Vitamin B12 deficiency 11/09/2022   Frequent falls 09/01/2022   AKI (acute kidney injury) 06/27/2022   Hemorrhoids 03/11/2022   Microalbuminuria 01/21/2022   Tremor, essential 12/11/2021   Chronic bilateral low back pain with right-sided sciatica 07/19/2021   Vaginal itching 04/16/2021   Sensory ataxia 03/28/2021   Elevated troponin 12/14/2020   Chronic post-traumatic stress disorder (PTSD) 12/06/2020   Senile corneal changes 12/06/2020   Postmenopausal bleeding 12/06/2020   Acquired hammer toe 12/06/2020   Opioid dependence  12/06/2020   Hypermetropia 12/06/2020   Generalized weakness 12/06/2020   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   Generalized anxiety disorder 12/06/2020   Functional diarrhea 10/26/2020   Insulin  dependent type 2 diabetes mellitus (HCC) 09/24/2020   Chronic respiratory failure with hypoxia (HCC) 04/25/2020   Chronic constipation 04/25/2020   Chronic pain syndrome 08/22/2019   CAD (coronary artery disease)    Hypocalcemia    S/P shoulder replacement, 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   Mixed hyperlipidemia 05/20/2019   Vertigo 04/25/2019   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   OSA (obstructive sleep apnea) 02/24/2019   At risk for adverse drug event 07/06/2018   Non-alcoholic fatty liver disease 01/12/2018   Mild intermittent asthma 01/12/2018   Senile osteoporosis 12/15/2017   Atrial fibrillation with RVR (HCC) 05/19/2017   Migraine headache with aura 02/12/2016   Diabetic neuropathy (HCC) 02/06/2016   Bipolar 1 disorder (HCC) 10/04/2015   Major depressive disorder 10/03/2015   Herpes genitalis in women 07/16/2015   Long term current use of anticoagulant 05/30/2015   Chronic heart failure with preserved ejection fraction (HFpEF) (HCC) 05/30/2015   RLS (restless legs syndrome) 04/27/2015   Insomnia 01/23/2015   Chronic atrial fibrillation with RVR (HCC) 01/04/2015    PCP: [name], MD  REFERRING PROVIDER: Margrette Taft BRAVO, MD  REFERRING DIAG: Gait disturbance, Contusion of right knee, initial encounter, Primary osteoarthritis of right knee   THERAPY DIAG:  Unsteadiness on feet  Muscle weakness (generalized)  Rationale for Evaluation and Treatment: Rehabilitation  ONSET DATE: November 2024  SUBJECTIVE:   SUBJECTIVE STATEMENT: Pt reports feeling very stiff today, around 7/10, all over. Reports she had a mole removed from the bottom of her L foot and its been bothering her when walking.   EVAL: Patient reports that her balance is her primary concern as her  right knee has gotten better. She feels that her balance has been getting worse since November 2024. She has tried using a cane and a walker, but she does not feel steady or safe using an assistive device. She is primarily a furniture walker for balance. She has fallen 3-4 times in the past 6 months. She notes that she has had multiple colonoscopies over the previous months due to abdominal pain. She has been having a lot of pain over the past few days and she is scheduled for another colonoscopy in December.   PERTINENT HISTORY: Atrial fibrillation, CHF, diabetic neuropathy, osteoporosis, bipolar 1 disorder, asthma, PTSD, anxiety, and history of cancer PAIN:  Are you having pain? Yes: NPRS scale: 7/10 Pain location: back and left knee  Pain description: sharp Aggravating factors: walking Relieving factors: rest and elevation  PRECAUTIONS: Fall  RED FLAGS: None   WEIGHT BEARING RESTRICTIONS: No  FALLS:  Has patient fallen in last 6 months? Yes. Number of falls 3-4  LIVING ENVIRONMENT: Lives with: lives with their spouse Lives in: House/apartment Stairs: Yes: Internal: 1 flight steps; on right going up; patient avoids these steps due to difficulty and instability Has following equipment at home: Quad cane small base, Walker - 4 wheeled, shower chair, and Grab bars  OCCUPATION: retired  PLOF: Independent  PATIENT GOALS: be able to go shopping and improved safety  NEXT MD VISIT: none scheduled  OBJECTIVE:  Note: Objective measures were completed at Evaluation unless otherwise noted.  DIAGNOSTIC FINDINGS: 01/29/24 right knee x-ray IMPRESSION: Moderate prepatellar soft tissue swelling is noted suggesting traumatic injury. No fracture or dislocation is noted.  PATIENT SURVEYS:  ABC scale: The Activities-Specific Balance Confidence (ABC) Scale 0% 10 20 30  40 50 60 70 80 90 100% No confidence<->completely confident  "How confident are you that you will not lose your balance or  become unsteady when you . . .   Date tested 03/28/24  Walk around the house 20%  2. Walk up or down stairs 10%  3. Bend over and pick up a slipper from in front of a closet floor 10%  4. Reach for a small can off a shelf at eye level 80%  5. Stand on tip toes and reach for something above your head 40%  6. Stand on a chair and reach for something 0%  7. Sweep the floor 0%  8. Walk outside the house to a car parked in the driveway 50%  9. Get into or out of a car 60%  10. Walk across a parking lot to the mall 0%  11. Walk up or down a ramp 40%  12. Walk in a crowded mall where people rapidly walk past you 10%  13. Are bumped into by people as you walk through the mall 0%  14. Step onto or off of an escalator while you are holding onto the railing 40%  15. Step onto or off an escalator while holding onto parcels such that  you cannot hold onto the railing 0%  16. Walk outside on icy sidewalks 0%  Total: #/16 22.5%      COGNITION: Overall cognitive status: Within functional limits for tasks assessed     SENSATION: Light touch: Impaired  and increased sensitivity to light touch in bilateral lower 1/3 of the tibia Patient reports numbness or tingling in her feet and lower legs.   EDEMA:  No edema observed  POSTURE: rounded shoulders, forward head, and flexed trunk   LOWER EXTREMITY ROM: unable to assess secondary to pain   LOWER EXTREMITY MMT: high pain severity and irritability with MMT which limited full assessment   MMT Right eval Left eval  Hip flexion 4-/5 3+/5  Hip extension    Hip abduction    Hip adduction    Hip internal rotation    Hip external rotation    Knee flexion    Knee extension    Ankle dorsiflexion 4-/5 4-/5  Ankle plantarflexion    Ankle inversion    Ankle eversion     (Blank rows = not tested)  FUNCTIONAL TESTS: will be completed a following appointments, as able 5 times sit to stand: 16 seconds on 04/04/24, BUE push up from arm rests Timed up  and go (TUG): 21 seconds on 04/04/24, no AD 2 minute walk test: 194 ft on 04/04/24, no AD, pt SOB  GAIT: Assistive device utilized: None Level of assistance: SBA Comments: very unsteady with ambulation, required external support from the wall and other objects for safety                                                                                                                                TREATMENT DATE: 04/04/24: Review of goals Gait trial with QC, 70 ft, v cues for gait pattern  Pt SOB, SPO2 93%, HR 84 bpm 5 times sit to stand, see above TUG, see above 2 minute walk test, see above Gait trial with SPC, 70 ft, v reminders for pattern Seated heel/toe raises, 2x10 LAQ, 10x each LE Standing hip abduction at // bars, 10x each LE     03/28/24: PT evaluation and patient education  PATIENT EDUCATION:  Education details: POC, prognosis, healing, objective testing, using a AD for safety, and goals for physical therapy Person educated: Patient Education method: Explanation Education comprehension: verbalized understanding  HOME EXERCISE PROGRAM: Access Code: [email or web address] URL: [email or web address] Date: 04/04/2024 Prepared by: Rosaria Powell-Butler  Exercises - Sit to Stand  - 2 x daily - 7 x weekly - 2 sets - 10 reps - Seated Heel Toe Raises  - 2 x daily - 7 x weekly - 2 sets - 10 reps - Seated Long Arc Quad  - 2 x daily - 7 x weekly - 2 sets - 10 reps - Standing Hip Abduction with Counter Support  - 2 x daily - 7 x weekly - 2 sets - 10 reps  ASSESSMENT:  CLINICAL  IMPRESSION: Patient arrives to session with increased pain/stiffness this date, reporting she did a lot of housework yesterday. Patient ambulates without AD, but uses walls for stability and is very unsteady. Began session with review of goals. Pt reports understanding. Completed 5 times sit to stand, TUG, and 2 minute walk test with result demonstrating LE weakness, increased fall risk, and decreased gait  speed. Administered HEP to include LE strengthening this date. Pt tolerated all well. Trial of ambulation with SPC and QC this date. Pt demonstrating impaired coordination, requiring consistent verbal cueing for 2 point gait pattern. However, pt more stable with QC than SPC. Would benefit from continued practice with this this. Pt limited during session due to SOB as well. SpO2 reading at 93% and HR 84 bpm. Pt requiring  multiple seated rest breaks. Patient will benefit from continued skilled physical therapy in order to address all of the above to improve overall function, decrease pain and improve fall risk.    EVAL: Patient is a 78 y.o. female who was seen today for physical therapy evaluation and treatment for unsteadiness on her feet and history of falling. She is a high fall risk as evidenced by her gait mechanics and her history of falling. Her full objective assessment was unable to be completed due to her high pain severity and irritability. She requested to leave early secondary to her pain levels. Recommend that she continue with skilled physical therapy to address her impairments to maximize her safety and functional mobility.    OBJECTIVE IMPAIRMENTS: Abnormal gait, decreased activity tolerance, decreased balance, decreased knowledge of use of DME, decreased mobility, difficulty walking, decreased strength, postural dysfunction, and pain.   ACTIVITY LIMITATIONS: carrying, bending, standing, stairs, transfers, and locomotion level  PARTICIPATION LIMITATIONS: meal prep, cleaning, laundry, shopping, and community activity  PERSONAL FACTORS: Past/current experiences, Time since onset of injury/illness/exacerbation, and 3+ comorbidities: Atrial fibrillation, CHF, diabetic neuropathy, osteoporosis, bipolar 1 disorder, asthma, PTSD, anxiety, and history of cancer are also affecting patient's functional outcome.   REHAB POTENTIAL: Fair    CLINICAL DECISION MAKING:  Unstable/unpredictable  EVALUATION COMPLEXITY: High   GOALS: Goals reviewed with patient? Yes  LONG TERM GOALS: Target date: 04/25/24  Patient will be independent with her HEP.  Baseline:  Goal status: INITIAL  2.  Patient will improve her ABC by at least 10% for improved perceived function with her daily activities.  Baseline:  Goal status: INITIAL  3.  Patient will be able to complete the timed up and go in 12 seconds or less to reduce her fall risk.  Baseline:  Goal status: INITIAL  4.  Patient will improve her timed up and go to 12 seconds or less to reduce her fall risk.  Baseline:  Goal status: INITIAL  5.  Patient will be able to demonstrate proper utilization of the least restrictive assistive device to improve her community mobility.  Baseline:  Goal status: INITIAL  PLAN:  PT FREQUENCY: 2x/week  PT DURATION: 4 weeks  PLANNED INTERVENTIONS: 97164- PT Re-evaluation, 97750- Physical Performance Testing, 97110-Therapeutic exercises, 97530- Therapeutic activity, [id number]- Neuromuscular re-education, 97535- Self Care, [id number]- Manual therapy, [id number]- Gait training, Patient/Family education, Balance training, Stair training, Cryotherapy, and Moist heat  PLAN FOR NEXT SESSION: gait training with QC, balance interventions, lower extremity strengthening   5:39 PM, 04/04/24 Pari Lombard Powell-Butler, PT, DPT [facility] [place]

## 2024-04-05 ENCOUNTER — Ambulatory Visit: Payer: Self-pay

## 2024-04-06 ENCOUNTER — Ambulatory Visit (INDEPENDENT_AMBULATORY_CARE_PROVIDER_SITE_OTHER): Admitting: Family Medicine

## 2024-04-06 ENCOUNTER — Ambulatory Visit

## 2024-04-06 ENCOUNTER — Encounter: Payer: Self-pay | Admitting: Family Medicine

## 2024-04-06 ENCOUNTER — Ambulatory Visit (HOSPITAL_COMMUNITY)

## 2024-04-06 ENCOUNTER — Encounter (HOSPITAL_COMMUNITY): Payer: Self-pay

## 2024-04-06 ENCOUNTER — Ambulatory Visit: Payer: Self-pay

## 2024-04-06 VITALS — BP 134/83 | HR 69 | Temp 97.4°F | Ht 60.0 in | Wt 163.4 lb

## 2024-04-06 DIAGNOSIS — R0789 Other chest pain: Secondary | ICD-10-CM

## 2024-04-06 DIAGNOSIS — M6281 Muscle weakness (generalized): Secondary | ICD-10-CM | POA: Diagnosis not present

## 2024-04-06 DIAGNOSIS — R0781 Pleurodynia: Secondary | ICD-10-CM | POA: Diagnosis not present

## 2024-04-06 DIAGNOSIS — I517 Cardiomegaly: Secondary | ICD-10-CM | POA: Diagnosis not present

## 2024-04-06 DIAGNOSIS — R2681 Unsteadiness on feet: Secondary | ICD-10-CM | POA: Diagnosis not present

## 2024-04-06 NOTE — Telephone Encounter (Signed)
  FYI Only or Action Required?: Action required by provider: request for appointment.  Patient was last seen in primary care on 03/15/2024 by [name], MD.  Called Nurse Triage reporting Rib Injury.  Symptoms began several weeks ago.  Interventions attempted: OTC medications: pain patch OTC.  Symptoms are: unchanged. Fell 2 weeks ago and was in the hospital. Has on going pain right ribs. Hurts to breath.  Triage Disposition: See Physician Within 24 Hours  Patient/caregiver understands and will follow disposition?: Yes    Copied from CRM #[id number]. Topic: Clinical - Red Word Triage >> Apr 06, 2024  7:44 AM Harlene ORN wrote: Red Word that prompted transfer to Nurse Triage: debilitating pain under right rib. Difficulty breathing Reason for Disposition  [1] MODERATE pain (e.g., interferes with normal activities) AND [2] high-risk adult (e.g., age > 60 years, osteoporosis, chronic steroid use)  Answer Assessment - Initial Assessment Questions 1. MECHANISM: How did the injury happen?     fall 2. ONSET: When did the injury happen? (.e.g., minutes, hours, days ago)     3 weeks ago 3. LOCATION: Where on the chest is the injury located?     Right side 4. APPEARANCE: What does the injury look like?     no 5. BLEEDING: Is there any bleeding now? If Yes, ask: How long has it been bleeding?     no 6. SEVERITY: Any difficulty with breathing?     yes 7. SIZE: For cuts, bruises, or swelling, ask: How large is it? (e.g., inches or centimeters)     N/a 8. PAIN: Is there pain? If Yes, ask: How bad is the pain? (e.g., Scale 0-10; none, mild, moderate, severe)     9 9. TETANUS: For any breaks in the skin, ask: When was your last tetanus booster?     N/a 10. PREGNANCY: Is there any chance you are pregnant? When was your last menstrual period?       no  Protocols used: Chest Injury-A-AH

## 2024-04-06 NOTE — Progress Notes (Signed)
 Subjective:  Patient ID: Amber Stephenson, female    DOB: 08/29/1945, 78 y.o.   MRN: [id number]  Patient Care Team: [name], MD as PCP - General (Family Medicine) Shlomo Wilbert SAUNDERS, MD as PCP - Cardiology (Cardiology) Kay Lynwood SQUIBB, MD as Referring Physician (Orthopedic Surgery) Tasia Lung, MD as Consulting Physician (Psychiatry) Georjean Darice HERO, MD as Consulting Physician (Neurology) Shamleffer, Donell Cardinal, MD as Consulting Physician (Endocrinology) Charmayne Molly, MD as Consulting Physician (Ophthalmology) Roddie Bring, DPM as Consulting Physician (Podiatry) Shlomo Wilbert SAUNDERS, MD as Consulting Physician (Cardiology) Shlomo Wilbert SAUNDERS, MD as Consulting Physician (Cardiology) Dina Camie FORBES DEVONNA (Neurology) Neda Jennet LABOR, MD as Consulting Physician (Pulmonary Disease) Parrett, Madelin RAMAN, NP as Nurse Practitioner (Pulmonary Disease) Bertrum Rosina HERO, RN as [facility] (General Practice) Bertrum Rosina HERO, RN   Chief Complaint:  Chest Pain (Right sided rib pain that has been going on since she was in the hospital on 9/15 )   HPI: Amber Stephenson is a 78 y.o. female presenting on 04/06/2024 for Chest Pain (Right sided rib pain that has been going on since she was in the hospital on 9/15 )  Amber Stephenson is a 78 year old female who presents with right-sided rib pain following a recent fall.  Right-sided rib pain - Severe pain located under the right rib, present since recent hospitalization - Pain has progressively worsened over the past few days - Pain is exacerbated by coughing - Bruising present on the anterior aspect of the right rib  Recent trauma - Fall occurred approximately one month ago, potentially contributing to current pain  Associated symptoms - No fever - No shortness of breath          Relevant past medical, surgical, family, and social history reviewed and updated as indicated.  Allergies and medications reviewed and  updated. Data reviewed: Chart in Epic.   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   Benign paroxysmal positional vertigo due to bilateral vestibular disorder 05/20/2019   Bipolar 1 disorder (HCC) 01/23/2015   with GAD, benzo dependence   CAD (coronary artery disease)    Nonobstructive; Managed by Dr. Charls   Cardiomegaly 01/12/2018   CHF (congestive heart failure) 06/08/2022   Chronic back pain 01/04/2015   Chronic constipation 04/25/2020   Chronic diastolic heart failure (HCC) 05/30/2015   Chronic pain syndrome 08/22/2019   back pain, sacroiliitis   Chronic post-traumatic stress disorder (PTSD) 12/06/2020   Diabetic neuropathy (HCC) 02/06/2016   Dyslipidemia 09/24/2020   Essential hypertension    Functional diarrhea 10/26/2020   Herpes genitalis in women 07/16/2015   History of adenomatous polyp of colon 05/21/2019   Overview:   03/31/17: Colonoscopy: nonadvanced adenoma, microscopic colitis, f/u 5 yrs, [facility]/GAP   History of radiation therapy    Endometrial- 02/18/23-03/31/23-Dr. Lynwood Kinard   Hypotension 09/01/2022   Insomnia 01/23/2015   Metabolic acidosis 01/12/2023   Migraine headache with aura 02/12/2016   Myofascial pain dysfunction syndrome 08/22/2019   Non-alcoholic fatty liver disease 01/12/2018   OSA (obstructive sleep apnea) 02/24/2019   10/09/2018 - HST  - AHI 40.6    Osteopenia 12/06/2020   Rx alendronate  35 mg.   Pinguecula 12/06/2020   Presbyopia 12/06/2020  Pulmonary hypertension    RLS (restless legs syndrome) 04/27/2015   RSV (respiratory syncytial virus infection) 06/10/2023   Tremor, essential 12/11/2021   Type II diabetes mellitus (HCC) 09/24/2020    Past Surgical  History:  Procedure Laterality Date   BREAST REDUCTION SURGERY     COLONOSCOPY  2018   6 mm cecal tubular adenoma, sigmoid diverticulosis, random colon biopsies consistent with microscopic colitis   COLONOSCOPY N/A 01/22/2024   Procedure: COLONOSCOPY;  Surgeon: Eartha Angelia Sieving, MD;  Location: [facility];  Service: Gastroenterology;  Laterality: N/A;   COLONOSCOPY N/A 02/23/2024   Procedure: COLONOSCOPY;  Surgeon: Eartha Angelia Sieving, MD;  Location: [facility];  Service: Gastroenterology;  Laterality: N/A;   ESOPHAGOGASTRODUODENOSCOPY N/A 01/22/2024   Procedure: EGD (ESOPHAGOGASTRODUODENOSCOPY);  Surgeon: Eartha Angelia, Sieving, MD;  Location: [facility];  Service: Gastroenterology;  Laterality: N/A;   ESOPHAGOGASTRODUODENOSCOPY N/A 02/23/2024   Procedure: EGD (ESOPHAGOGASTRODUODENOSCOPY);  Surgeon: Eartha Angelia, Sieving, MD;  Location: [facility];  Service: Gastroenterology;  Laterality: N/A;   EYE SURGERY Right    cateracts   HAMMER TOE SURGERY     LEFT HEART CATH AND CORONARY ANGIOGRAPHY N/A 02/03/2018   Procedure: LEFT HEART CATH AND CORONARY ANGIOGRAPHY;  Surgeon: Jordan, Peter M, MD;  Location: [facility] INVASIVE CV LAB;  Service: Cardiovascular;  Laterality: N/A;   REVERSE SHOULDER ARTHROPLASTY Right 08/19/2019   Procedure: REVERSE SHOULDER ARTHROPLASTY;  Surgeon: Kay Kemps, MD;  Location: [facility];  Service: Orthopedics;  Laterality: Right;  interscalene block   SHOULDER SURGERY Right    I BROKE MY SHOUDLER   THIGH SURGERY     TO REMOVE A TUMOR     Social History   Socioeconomic History   Marital status: Married    Spouse name: Dwight   Number of children: 3   Years of education: 15   Highest education level: Associate degree: academic program  Occupational History   Occupation: Retired    Comment: chief financial officer  Tobacco Use   Smoking status: Never   Smokeless tobacco: Never  Vaping Use   Vaping status: Never Used  Substance and Sexual  Activity   Alcohol  use: Not Currently    Alcohol /week: 0.0 standard drinks of alcohol    Drug use: No   Sexual activity: Not Currently    Partners: Male    Birth control/protection: Post-menopausal  Other Topics Concern   Not on file  Social History Narrative   Lives at home with husband.    They have 3 children who live away - [place] , [place] , and [place] .   She is from [place]  and most of her family lives there.   Her husbands's family live nearby   Right handed.   Social Drivers of Corporate Investment Banker Strain: Low Risk  (08/13/2023)   Overall Financial Resource Strain (CARDIA)    Difficulty of Paying Living Expenses: Not hard at all  Food Insecurity: No Food Insecurity (02/15/2024)   Hunger Vital Sign    Worried About Running Out of Food in the Last Year: Never true    Ran Out of Food in the Last Year: Never true  Transportation Needs: No Transportation Needs (02/15/2024)   PRAPARE - Administrator, Civil Service (Medical): No    Lack of Transportation (Non-Medical): No  Physical Activity: Insufficiently Active (08/13/2023)   Exercise Vital Sign    Days of Exercise per Week: 3 days    Minutes of Exercise per Session: 20 min  Stress: No Stress Concern  Present (08/13/2023)   [employer] of Occupational Health - Occupational Stress Questionnaire    Feeling of Stress : Only a little  Social Connections: Socially Integrated (02/15/2024)   Social Connection and Isolation Panel    Frequency of Communication with Friends and Family: More than three times a week    Frequency of Social Gatherings with Friends and Family: Once a week    Attends Religious Services: More than 4 times per year    Active Member of [employer] or Organizations: Yes    Attends Engineer, Structural: More than 4 times per year    Marital Status: Married  Catering Manager Violence: Not At Risk (02/15/2024)   Humiliation, Afraid, Rape, and Kick questionnaire    Fear of Current or  Ex-Partner: No    Emotionally Abused: No    Physically Abused: No    Sexually Abused: No    Outpatient Encounter Medications as of 04/06/2024  Medication Sig   acetaminophen  (TYLENOL ) 325 MG tablet Take 2 tablets (650 mg total) by mouth every 6 (six) hours as needed for mild pain (pain score 1-3) or fever (or Fever >/= 101).   ALPRAZolam  (XANAX ) 1 MG tablet Take 1 mg by mouth at bedtime as needed for sleep.   ARIPiprazole  (ABILIFY ) 2 MG tablet Take 1 tablet (2 mg total) by mouth at bedtime.   atorvastatin  (LIPITOR) 80 MG tablet Take 1 tablet (80 mg total) by mouth daily.   carbidopa -levodopa  (SINEMET ) 10-100 MG tablet Take 1 tablet by mouth 4 (four) times daily. For restless leg syndrome   colchicine  0.6 MG tablet Take 1 tablet (0.6 mg total) by mouth every 12 (twelve) hours as needed (gout flare.). For mouth sores   Continuous Glucose Sensor (DEXCOM G7 SENSOR) MISC CHANGE SENSOR EVERY 10 DAYS   cyanocobalamin  (VITAMIN B12) 1000 MCG tablet Take 1 tablet (1,000 mcg total) by mouth daily.   diphenoxylate -atropine  (LOMOTIL ) 2.5-0.025 MG tablet TAKE 2 TABLETS BY MOUTH FOUR TIMES DAILY AS NEEDED FOR DIARRHEA OR LOOSE STOOLS   ELIQUIS  5 MG TABS tablet TAKE 1 TABLET TWICE A DAY   escitalopram  (LEXAPRO ) 10 MG tablet Take 10 mg by mouth daily.   ferrous sulfate  325 (65 FE) MG EC tablet Take 1 tablet (325 mg total) by mouth 2 (two) times daily.   furosemide  (LASIX ) 40 MG tablet Take 1 tablet (40 mg total) by mouth 2 (two) times daily.   gabapentin  (NEURONTIN ) 100 MG capsule Take 1 capsule (100 mg total) by mouth 3 (three) times daily.   hydrocortisone  (ANUSOL -HC) 2.5 % rectal cream Place rectally 2 (two) times daily.   hydrOXYzine  (VISTARIL ) 25 MG capsule Take 1 capsule (25 mg total) by mouth every 8 (eight) hours as needed for anxiety.   insulin  glargine (LANTUS  SOLOSTAR) 100 UNIT/ML Solostar Pen Inject 10 Units into the skin at bedtime.   insulin  lispro (HUMALOG  KWIKPEN) 100 UNIT/ML KwikPen Inject  0-10 Units into the skin 2 (two) times daily before a meal. Sliding scale Short acting Humalog  insulin  per sliding scale 0-10 ---:-- Insulin  injection 0-10 Units 0-10 Units Subcutaneous, 3 times daily with meals CBG 70 - 120: 0 unit CBG 121 - 150: 0 unit  CBG 151 - 200: 1 unit CBG 201 - 250: 2 units CBG 251 - 300: 4 units CBG 301 - 350: 6 units  CBG 351 - 400: 8 units  CBG > 400: 10 units   Insulin  Pen Needle 29G X MISC 1 Device by Does not apply route daily  in the afternoon.   lidocaine  (LIDODERM ) 5 % Place 1 patch onto the skin daily. Remove & Discard patch within 12 hours or as directed by MD   lidocaine  (XYLOCAINE ) 5 % ointment Apply 1 Application topically 4 (four) times daily as needed. For foot and ankle nerve pain-  can use up to 4x/day as needed   losartan  (COZAAR ) 25 MG tablet Take 25 mg by mouth daily.   MAGNESIUM  PO Take 1 tablet by mouth daily.   Methocarbamol  1000 MG TABS Take 1,000 mg by mouth every 6 (six) hours as needed.   metoprolol  tartrate (LOPRESSOR ) 50 MG tablet Take 1 tablet (50 mg total) by mouth 2 (two) times daily.   Misc. Devices (3-IN-1 BEDSIDE TOILET) MISC Length of need 99 months M17.11 Osteoarthritis right knee / multiple falls / high risk fall   ondansetron  (ZOFRAN ) 4 MG tablet Take 1 tablet (4 mg total) by mouth every 8 (eight) hours as needed for nausea or vomiting. For cancer related nausea   OXcarbazepine  (TRILEPTAL ) 150 MG tablet Take 1 tablet (150 mg total) by mouth 2 (two) times daily.   oxyCODONE  (OXYCONTIN ) 20 mg 12 hr tablet Take 1 tablet (20 mg total) by mouth every 12 (twelve) hours. G89.3- chronic pain- fill 28 days after 04/23/2024   pantoprazole  (PROTONIX ) 40 MG tablet Take 1 tablet (40 mg total) by mouth 2 (two) times daily.   potassium chloride  (KLOR-CON ) 10 MEQ tablet Take 1 tablet (10 mEq total) by mouth daily.   pregabalin  (LYRICA ) 25 MG capsule Take 1 capsule (25 mg total) by mouth 3 (three) times daily. Will start at at bedtime x 4 days then  BID x 4 days, then TID- for nerve pain   rOPINIRole  (REQUIP ) 2 MG tablet Take 1 tablet (2 mg total) by mouth at bedtime.   spironolactone  (ALDACTONE ) 25 MG tablet Take 0.5 tablets (12.5 mg total) by mouth daily.   tirzepatide  (MOUNJARO ) 12.5 MG/0.5ML Pen Inject 12.5 mg into the skin once a week.   vitamin C  (VITAMIN C ) 500 MG tablet Take 0.5 tablets (250 mg total) by mouth 2 (two) times daily.   Vitamin D , Cholecalciferol , 25 MCG (1000 UT) TABS Take 1,000 Units by mouth in the morning.   Facility-Administered Encounter Medications as of 04/06/2024  Medication   bupivacaine -epinephrine  (MARCAINE  W/ EPI) 0.5% -1:200000 injection   cyanocobalamin  (VITAMIN B12) injection 1,000 mcg   denosumab  (PROLIA ) injection 60 mg    Allergies  Allergen Reactions   Iodinated Contrast Media Anaphylaxis, Swelling and Other (See Comments)    Throat closes, swelling of throat and tongue   Iodine Anaphylaxis   Latex Other (See Comments)    Latex tape pulls skin with it   Tape Other (See Comments)    Pulls off the skin, if latex   Tizanidine  Other (See Comments)    Weakness, goofy, bad dreams    Pertinent ROS per HPI, otherwise unremarkable      Objective:  BP 134/83   Pulse 69   Temp (!) 97.4 F (36.3 C)   Ht 5' (1.524 m)   Wt 163 lb 6.4 oz (74.1 kg)   SpO2 94%   BMI 31.91 kg/m    Wt Readings from Last 3 Encounters:  04/06/24 163 lb 6.4 oz (74.1 kg)  03/25/24 160 lb (72.6 kg)  03/23/24 165 lb (74.8 kg)    Physical Exam Vitals and nursing note reviewed.  Constitutional:      Appearance: She is obese. She is ill-appearing (chronically ill).  HENT:     Head: Normocephalic and atraumatic.     Nose: Nose normal.     Mouth/Throat:     Mouth: Mucous membranes are moist.  Eyes:     Pupils: Pupils are equal, round, and reactive to light.  Cardiovascular:     Rate and Rhythm: Normal rate and regular rhythm.     Heart sounds: Normal heart sounds.  Pulmonary:     Effort: Pulmonary effort  is normal. No respiratory distress.     Breath sounds: Normal breath sounds. No decreased air movement. No decreased breath sounds, wheezing or rhonchi.  Chest:     Chest wall: Tenderness present.    Musculoskeletal:     Cervical back: Neck supple.  Skin:    General: Skin is warm and dry.     Capillary Refill: Capillary refill takes less than 2 seconds.  Neurological:     Mental Status: She is alert. Mental status is at baseline.     Gait: Gait abnormal (slow, antalgic).  Psychiatric:        Attention and Perception: Attention normal.        Mood and Affect: Affect is flat.        Speech: Speech is delayed.        Behavior: Behavior is slowed.    X-Ray: Chest and right ribs: No acute findings. Preliminary x-ray reading by Rosaline Bruns, FNP-C, [facility].   Results for orders placed or performed in visit on 03/15/24  Vitamin B12   Collection Time: 03/15/24  4:03 PM  Result Value Ref Range   Vitamin B-12 843 232 - 1,245 pg/mL   *Note: Due to a large number of results and/or encounters for the requested time period, some results have not been displayed. A complete set of results can be found in Results Review.       Pertinent labs & imaging results that were available during my care of the patient were reviewed by me and considered in my medical decision making.  Assessment & Plan:  Antwan was seen today for chest pain.  Diagnoses and all orders for this visit:  Rib pain on right side -     DG Ribs Unilateral W/Chest Right      Right anterior rib pain Chronic right anterior rib pain, exacerbated over the last few days, worsens with coughing. No fever or shortness of breath. Bruising present. Lungs clear on auscultation, reducing concern for pneumothorax. X-ray shows no acute findings at the indicated pain site. - Continue current pain medications as prescribed. - Apply heat to the affected area. - Follow up with PCP if symptoms worsen.          Continue all other  maintenance medications.  Follow up plan: Return if symptoms worsen or fail to improve.   Continue healthy lifestyle choices, including diet (rich in fruits, vegetables, and lean proteins, and low in salt and simple carbohydrates) and exercise (at least 30 minutes of moderate physical activity daily).    The above assessment and management plan was discussed with the patient. The patient verbalized understanding of and has agreed to the management plan. Patient is aware to call the clinic if they develop any new symptoms or if symptoms persist or worsen. Patient is aware when to return to the clinic for a follow-up visit. Patient educated on when it is appropriate to go to the emergency department.   Rosaline Bruns, FNP-C [facility] [phone]

## 2024-04-06 NOTE — Telephone Encounter (Signed)
Noted  -LS

## 2024-04-06 NOTE — Therapy (Signed)
 OUTPATIENT PHYSICAL THERAPY LOWER EXTREMITY TREATMENT   Patient Name: Amber Stephenson MRN: [id number] DOB:1945-11-01, 78 y.o., female Today's Date: 04/06/2024  END OF SESSION:  PT End of Session - 04/06/24 1502     Visit Number 3    Number of Visits 8    Date for Recertification  05/27/24    Authorization Type Medicare part A and B, with [employer] for life secondary    Authorization Time Period no authorization required    PT Start Time 1502    PT Stop Time 1527    PT Time Calculation (min) 25 min    Equipment Utilized During Treatment Gait belt    Activity Tolerance Patient limited by fatigue;Patient limited by pain    Behavior During Therapy [facility] for tasks assessed/performed          Past Medical History:  Diagnosis Date   Allergic rhinitis 02/24/2019   Ambulatory dysfunction 04/23/2020   Atrial fibrillation with RVR (HCC) 05/19/2017   Back pain/sacroiliitis--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   Benign paroxysmal positional vertigo due to bilateral vestibular disorder 05/20/2019   Bipolar 1 disorder (HCC) 01/23/2015   with GAD, benzo dependence   CAD (coronary artery disease)    Nonobstructive; Managed by Dr. Charls   Cardiomegaly 01/12/2018   CHF (congestive heart failure) 06/08/2022   Chronic back pain 01/04/2015   Chronic constipation 04/25/2020   Chronic diastolic heart failure (HCC) 05/30/2015   Chronic pain syndrome 08/22/2019   back pain, sacroiliitis   Chronic post-traumatic stress disorder (PTSD) 12/06/2020   Diabetic neuropathy (HCC) 02/06/2016   Dyslipidemia 09/24/2020   Essential hypertension    Functional diarrhea 10/26/2020   Herpes genitalis in women 07/16/2015   History of adenomatous polyp of colon 05/21/2019   Overview:   03/31/17: Colonoscopy: nonadvanced adenoma, microscopic colitis, f/u 5 yrs, Murphy/GAP   History of radiation therapy     Endometrial- 02/18/23-03/31/23-Dr. Lynwood Kinard   Hypotension 09/01/2022   Insomnia 01/23/2015   Metabolic acidosis 01/12/2023   Migraine headache with aura 02/12/2016   Myofascial pain dysfunction syndrome 08/22/2019   Non-alcoholic fatty liver disease 01/12/2018   OSA (obstructive sleep apnea) 02/24/2019   10/09/2018 - HST  - AHI 40.6    Osteopenia 12/06/2020   Rx alendronate  35 mg.   Pinguecula 12/06/2020   Presbyopia 12/06/2020   Pulmonary hypertension    RLS (restless legs syndrome) 04/27/2015   RSV (respiratory syncytial virus infection) 06/10/2023   Tremor, essential 12/11/2021   Type II diabetes mellitus (HCC) 09/24/2020   Past Surgical History:  Procedure Laterality Date   BREAST REDUCTION SURGERY     COLONOSCOPY  2018   6 mm cecal tubular adenoma, sigmoid diverticulosis, random colon biopsies consistent with microscopic colitis   COLONOSCOPY N/A 01/22/2024   Procedure: COLONOSCOPY;  Surgeon: Eartha Angelia Sieving, MD;  Location: [facility];  Service: Gastroenterology;  Laterality: N/A;   COLONOSCOPY N/A 02/23/2024   Procedure: COLONOSCOPY;  Surgeon: Eartha Angelia Sieving, MD;  Location: [facility];  Service: Gastroenterology;  Laterality: N/A;   ESOPHAGOGASTRODUODENOSCOPY N/A 01/22/2024   Procedure: EGD (ESOPHAGOGASTRODUODENOSCOPY);  Surgeon: Eartha Angelia, Sieving, MD;  Location: [facility];  Service: Gastroenterology;  Laterality: N/A;   ESOPHAGOGASTRODUODENOSCOPY N/A 02/23/2024   Procedure: EGD (ESOPHAGOGASTRODUODENOSCOPY);  Surgeon: Eartha Angelia, Sieving, MD;  Location: [facility];  Service: Gastroenterology;  Laterality: N/A;   EYE SURGERY Right    cateracts  HAMMER TOE SURGERY     LEFT HEART CATH AND CORONARY ANGIOGRAPHY N/A 02/03/2018   Procedure: LEFT HEART CATH AND CORONARY ANGIOGRAPHY;  Surgeon: Jordan, Peter M, MD;  Location: [facility] INVASIVE CV LAB;  Service: Cardiovascular;  Laterality: N/A;   REVERSE SHOULDER ARTHROPLASTY Right 08/19/2019    Procedure: REVERSE SHOULDER ARTHROPLASTY;  Surgeon: Kay Kemps, MD;  Location: [facility];  Service: Orthopedics;  Laterality: Right;  interscalene block   SHOULDER SURGERY Right    I BROKE MY SHOUDLER   THIGH SURGERY     TO REMOVE A TUMOR    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 02/04/2024   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   Osteopenia 02/04/2024   Pain of foot 02/04/2024   Pain of right breast 02/04/2024   Phase of life problem 02/04/2024   Acute gastric ulcer without hemorrhage or perforation 01/23/2024   Hematochezia 01/21/2024   Acute anemia 01/21/2024   ABLA (acute blood loss anemia) 01/19/2024   Essential hypertension 01/07/2024   Rectal bleeding 10/07/2023   Nocturnal enuresis 08/25/2023   Hyponatremia 06/10/2023   Abdominal pain 06/10/2023   Diarrhea 06/10/2023   Candidiasis of mouth 05/15/2023   Type 2 diabetes mellitus with hyperglycemia, with long-term current use of insulin  (HCC) 02/12/2023   Long term (current) use of oral hypoglycemic drugs 02/12/2023   Primary hypertension 01/14/2023   Endometrial cancer (HCC) 01/12/2023   Vulvar itching 12/22/2022   Burning with urination 11/13/2022   Vitamin B12 deficiency 11/09/2022   Frequent falls 09/01/2022   AKI (acute kidney injury) 06/27/2022   Hemorrhoids 03/11/2022   Microalbuminuria 01/21/2022   Tremor, essential 12/11/2021   Chronic bilateral low back pain with right-sided sciatica 07/19/2021   Vaginal itching 04/16/2021   Sensory ataxia 03/28/2021   Elevated troponin 12/14/2020   Chronic post-traumatic stress disorder (PTSD) 12/06/2020   Senile corneal changes 12/06/2020   Postmenopausal  bleeding 12/06/2020   Acquired hammer toe 12/06/2020   Opioid dependence 12/06/2020   Hypermetropia 12/06/2020   Generalized weakness 12/06/2020   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   Generalized anxiety disorder 12/06/2020   Functional diarrhea 10/26/2020   Insulin  dependent type 2 diabetes mellitus (HCC) 09/24/2020   Chronic respiratory failure with hypoxia (HCC) 04/25/2020   Chronic constipation 04/25/2020   Chronic pain syndrome 08/22/2019   CAD (coronary artery disease)    Hypocalcemia    S/P shoulder replacement, 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   Mixed hyperlipidemia 05/20/2019   Vertigo 04/25/2019   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   OSA (obstructive sleep apnea) 02/24/2019   At risk for adverse drug event 07/06/2018   Non-alcoholic fatty liver disease 01/12/2018   Mild intermittent asthma 01/12/2018   Senile osteoporosis 12/15/2017   Atrial fibrillation with RVR (HCC) 05/19/2017   Migraine headache with aura 02/12/2016   Diabetic neuropathy (HCC) 02/06/2016   Bipolar 1 disorder (HCC) 10/04/2015   Major depressive disorder 10/03/2015   Herpes genitalis in women 07/16/2015   Long term current use of anticoagulant 05/30/2015   Chronic heart failure with preserved ejection fraction (HFpEF) (HCC) 05/30/2015   RLS (restless legs syndrome) 04/27/2015   Insomnia 01/23/2015   Chronic atrial fibrillation with RVR (HCC) 01/04/2015    PCP: [name],  MD  REFERRING PROVIDER: Margrette Taft BRAVO, MD   REFERRING DIAG: Gait disturbance, Contusion of right knee, initial encounter, Primary osteoarthritis of right knee   THERAPY DIAG:  Unsteadiness on feet  Muscle weakness (generalized)  Rationale for Evaluation and Treatment: Rehabilitation  ONSET DATE: November 2024  SUBJECTIVE:   SUBJECTIVE STATEMENT: Reports she is tired today, has been to MD apt and found to have arthritis in Rt ribcage, increased pain scale 6/10, fatigue level at 10/10.  Arrived without AD,  reports would like to practice with cane today.    EVAL: Patient reports that her balance is her primary concern as her right knee has gotten better. She feels that her balance has been getting worse since November 2024. She has tried using a cane and a walker, but she does not feel steady or safe using an assistive device. She is primarily a furniture walker for balance. She has fallen 3-4 times in the past 6 months. She notes that she has had multiple colonoscopies over the previous months due to abdominal pain. She has been having a lot of pain over the past few days and she is scheduled for another colonoscopy in December.   PERTINENT HISTORY: Atrial fibrillation, CHF, diabetic neuropathy, osteoporosis, bipolar 1 disorder, asthma, PTSD, anxiety, and history of cancer PAIN:  Are you having pain? Yes: NPRS scale: 6/10 Pain location: back 7/10 and left knee, rib Pain description: sharp Aggravating factors: walking Relieving factors: rest and elevation  PRECAUTIONS: Fall  RED FLAGS: None   WEIGHT BEARING RESTRICTIONS: No  FALLS:  Has patient fallen in last 6 months? Yes. Number of falls 3-4  LIVING ENVIRONMENT: Lives with: lives with their spouse Lives in: House/apartment Stairs: Yes: Internal: 1 flight steps; on right going up; patient avoids these steps due to difficulty and instability Has following equipment at home: Quad cane small base, Walker - 4 wheeled, shower chair, and Grab bars  OCCUPATION: retired  PLOF: Independent  PATIENT GOALS: be able to go shopping and improved safety  NEXT MD VISIT: none scheduled  OBJECTIVE:  Note: Objective measures were completed at Evaluation unless otherwise noted.  DIAGNOSTIC FINDINGS: 01/29/24 right knee x-ray IMPRESSION: Moderate prepatellar soft tissue swelling is noted suggesting traumatic injury. No fracture or dislocation is noted.  PATIENT SURVEYS:  ABC scale: The Activities-Specific Balance Confidence (ABC) Scale 0% 10  20 30  40 50 60 70 80 90 100% No confidence<->completely confident  "How confident are you that you will not lose your balance or become unsteady when you . . .   Date tested 03/28/24  Walk around the house 20%  2. Walk up or down stairs 10%  3. Bend over and pick up a slipper from in front of a closet floor 10%  4. Reach for a small can off a shelf at eye level 80%  5. Stand on tip toes and reach for something above your head 40%  6. Stand on a chair and reach for something 0%  7. Sweep the floor 0%  8. Walk outside the house to a car parked in the driveway 50%  9. Get into or out of a car 60%  10. Walk across a parking lot to the mall 0%  11. Walk up or down a ramp 40%  12. Walk in a crowded mall where people rapidly walk past you 10%  13. Are bumped into by people as you walk through the mall 0%  14. Step onto or off of an escalator while you  are holding onto the railing 40%  15. Step onto or off an escalator while holding onto parcels such that you cannot hold onto the railing 0%  16. Walk outside on icy sidewalks 0%  Total: #/16 22.5%      COGNITION: Overall cognitive status: Within functional limits for tasks assessed     SENSATION: Light touch: Impaired  and increased sensitivity to light touch in bilateral lower 1/3 of the tibia Patient reports numbness or tingling in her feet and lower legs.   EDEMA:  No edema observed  POSTURE: rounded shoulders, forward head, and flexed trunk   LOWER EXTREMITY ROM: unable to assess secondary to pain   LOWER EXTREMITY MMT: high pain severity and irritability with MMT which limited full assessment   MMT Right eval Left eval  Hip flexion 4-/5 3+/5  Hip extension    Hip abduction    Hip adduction    Hip internal rotation    Hip external rotation    Knee flexion    Knee extension    Ankle dorsiflexion 4-/5 4-/5  Ankle plantarflexion    Ankle inversion    Ankle eversion     (Blank rows = not tested)  FUNCTIONAL TESTS: will  be completed a following appointments, as able 5 times sit to stand: 16 seconds on 04/04/24, BUE push up from arm rests Timed up and go (TUG): 21 seconds on 04/04/24, no AD 2 minute walk test: 194 ft on 04/04/24, no AD, pt SOB  GAIT: Assistive device utilized: None Level of assistance: SBA Comments: very unsteady with ambulation, required external support from the wall and other objects for safety                                                                                                                                TREATMENT DATE: 04/06/24: Gait training QC 46ft, step to pattern, 3 point sequence Gait training QC 34ft, step thru pattern 3 point sequence  04/04/24: Review of goals Gait trial with QC, 70 ft, v cues for gait pattern  Pt SOB, SPO2 93%, HR 84 bpm 5 times sit to stand, see above TUG, see above 2 minute walk test, see above Gait trial with SPC, 70 ft, v reminders for pattern Seated heel/toe raises, 2x10 LAQ, 10x each LE Standing hip abduction at // bars, 10x each LE     03/28/24: PT evaluation and patient education  PATIENT EDUCATION:  Education details: POC, prognosis, healing, objective testing, using a AD for safety, and goals for physical therapy Person educated: Patient Education method: Explanation Education comprehension: verbalized understanding  HOME EXERCISE PROGRAM: Access Code: [email or web address] URL: [email or web address] Date: 04/04/2024 Prepared by: Rosaria Powell-Butler  Exercises - Sit to Stand  - 2 x daily - 7 x weekly - 2 sets - 10 reps - Seated Heel Toe Raises  - 2 x daily - 7 x weekly - 2 sets - 10 reps - Seated Long Arc  Quad  - 2 x daily - 7 x weekly - 2 sets - 10 reps - Standing Hip Abduction with Counter Support  - 2 x daily - 7 x weekly - 2 sets - 10 reps  ASSESSMENT:  CLINICAL IMPRESSION: Pt limited by fatigue with frequent need for rest breaks.  Pt arrived without AD and presents with instability during gait.  Pt educated  on benefits of ambulating with her RW until she purchases cane for fall prevention and activity tolerance.  Educated on correct placement with cane and LE and gait training with initial step to 3 point sequence to step thru with min A for safety.  Pt requested session to end early due to fatigue.     EVAL: Patient is a 78 y.o. female who was seen today for physical therapy evaluation and treatment for unsteadiness on her feet and history of falling. She is a high fall risk as evidenced by her gait mechanics and her history of falling. Her full objective assessment was unable to be completed due to her high pain severity and irritability. She requested to leave early secondary to her pain levels. Recommend that she continue with skilled physical therapy to address her impairments to maximize her safety and functional mobility.    OBJECTIVE IMPAIRMENTS: Abnormal gait, decreased activity tolerance, decreased balance, decreased knowledge of use of DME, decreased mobility, difficulty walking, decreased strength, postural dysfunction, and pain.   ACTIVITY LIMITATIONS: carrying, bending, standing, stairs, transfers, and locomotion level  PARTICIPATION LIMITATIONS: meal prep, cleaning, laundry, shopping, and community activity  PERSONAL FACTORS: Past/current experiences, Time since onset of injury/illness/exacerbation, and 3+ comorbidities: Atrial fibrillation, CHF, diabetic neuropathy, osteoporosis, bipolar 1 disorder, asthma, PTSD, anxiety, and history of cancer are also affecting patient's functional outcome.   REHAB POTENTIAL: Fair    CLINICAL DECISION MAKING: Unstable/unpredictable  EVALUATION COMPLEXITY: High   GOALS: Goals reviewed with patient? Yes  LONG TERM GOALS: Target date: 04/25/24  Patient will be independent with her HEP.  Baseline:  Goal status: INITIAL  2.  Patient will improve her ABC by at least 10% for improved perceived function with her daily activities.  Baseline:  Goal  status: INITIAL  3.  Patient will be able to complete the timed up and go in 12 seconds or less to reduce her fall risk.  Baseline:  Goal status: INITIAL  4.  Patient will improve her timed up and go to 12 seconds or less to reduce her fall risk.  Baseline:  Goal status: INITIAL  5.  Patient will be able to demonstrate proper utilization of the least restrictive assistive device to improve her community mobility.  Baseline:  Goal status: INITIAL  PLAN:  PT FREQUENCY: 2x/week  PT DURATION: 4 weeks  PLANNED INTERVENTIONS: 97164- PT Re-evaluation, 97750- Physical Performance Testing, 97110-Therapeutic exercises, 97530- Therapeutic activity, 97112- Neuromuscular re-education, 97535- Self Care, [id number]- Manual therapy, [phone]- Gait training, Patient/Family education, Balance training, Stair training, Cryotherapy, and Moist heat  PLAN FOR NEXT SESSION: gait training with QC, balance interventions, lower extremity strengthening  Augustin Mclean, LPTA/CLT; CBIS [phone]  3:32 PM, 04/06/24

## 2024-04-08 ENCOUNTER — Encounter: Payer: Self-pay | Admitting: Nurse Practitioner

## 2024-04-08 ENCOUNTER — Other Ambulatory Visit: Payer: Self-pay

## 2024-04-08 ENCOUNTER — Ambulatory Visit: Payer: Self-pay

## 2024-04-08 ENCOUNTER — Ambulatory Visit (INDEPENDENT_AMBULATORY_CARE_PROVIDER_SITE_OTHER): Admitting: Nurse Practitioner

## 2024-04-08 ENCOUNTER — Telehealth: Payer: Self-pay | Admitting: Family Medicine

## 2024-04-08 VITALS — BP 132/71 | HR 65 | Temp 97.3°F

## 2024-04-08 DIAGNOSIS — L03818 Cellulitis of other sites: Secondary | ICD-10-CM | POA: Diagnosis not present

## 2024-04-08 MED ORDER — DOXYCYCLINE HYCLATE 100 MG PO TABS: 100.0000 mg | ORAL_TABLET | Freq: Two times a day (BID) | ORAL | 0 refills | Status: DC

## 2024-04-08 NOTE — Telephone Encounter (Signed)
 FYI Only or Action Required?: FYI only for provider: appointment scheduled on 04/08/24.  Patient was last seen in primary care on 04/06/2024 by Amber Rock HERO, FNP.  Called Nurse Triage reporting Leg Swelling.  Symptoms began several days ago.  Interventions attempted: Rest, hydration, or home remedies.  Symptoms are: unchanged.  Triage Disposition: See Physician Within 24 Hours  Patient/caregiver understands and will follow disposition?: Yes   Copied from CRM #[id number]. Topic: Clinical - Red Word Triage >> Apr 08, 2024  1:37 PM Everette C wrote: [facility] that prompted transfer to Nurse Triage: The patient has called to share hat they are currently experiencing swelling in their left foot due to a recent dermatological procedure Reason for Disposition  [1] MODERATE leg swelling (e.g., swelling extends up to knees) AND [2] new-onset or getting worse  Answer Assessment - Initial Assessment Questions No available appt with pcp, scheduled with alternative provider, 04/08/24.  Advised call back or ED/911 if symptoms worsen.  1. ONSET: When did the swelling start? (e.g., minutes, hours, days)     3 days after procedure 2. LOCATION: What part of the leg is swollen?  Are both legs swollen or just one leg?     Biopsy and mole removal, left foot, redness around site, swelling at site and right side of foot, throbbing, hot to touch, denies drainage 3. SEVERITY: How bad is the swelling? (e.g., localized; mild, moderate, severe)    mild localized swelling; able to walk but having severe pain, denies numbness/ weakness 4. REDNESS: Is there redness or signs of infection?     yes 5. PAIN: Is the swelling painful to touch? If Yes, ask: How painful is it?   (Scale 1-10; mild, moderate or severe)     10/10; soaked in Epsom salt, made worse 6. FEVER: Do you have a fever? If Yes, ask: What is it, how was it measured, and when did it start?      Denies fever, chills, n/v  7. CAUSE:  What do you think is causing the leg swelling?     procedure 8. MEDICAL HISTORY: Do you have a history of blood clots (e.g., DVT), cancer, heart failure, kidney disease, or liver failure?    chf 10. OTHER SYMPTOMS: Do you have any other symptoms? (e.g., chest pain, difficulty breathing)       Denies chest pain, difficulty breathing, calf muscle Use oxygen   Protocols used: Leg Swelling and Edema-A-AH

## 2024-04-08 NOTE — Telephone Encounter (Signed)
 Patient scheduled to be seen for this today.

## 2024-04-08 NOTE — Progress Notes (Signed)
 Subjective:    Patient ID: Amber Stephenson, female    DOB: Oct 17, 1945, 78 y.o.   MRN: [id number]   Chief Complaint: foot pain  HPI  Patient had mole removed off of her left foot 2 weeks ago. Is very painful and she is afraid it is infected. She called dermatologist who removed lesion and they told her to see her PCP. 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 02/04/2024   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   Osteopenia 02/04/2024   Pain of foot 02/04/2024   Pain of right breast 02/04/2024   Phase of life problem 02/04/2024   Acute gastric ulcer without hemorrhage or perforation 01/23/2024   Hematochezia 01/21/2024   Acute anemia 01/21/2024   ABLA (acute blood loss anemia) 01/19/2024   Essential hypertension 01/07/2024   Rectal bleeding 10/07/2023   Nocturnal enuresis 08/25/2023   Hyponatremia 06/10/2023   Abdominal pain 06/10/2023   Diarrhea 06/10/2023   Candidiasis of mouth 05/15/2023   Type 2 diabetes mellitus with hyperglycemia, with long-term current use of insulin  (HCC) 02/12/2023   Long term (current) use of oral hypoglycemic drugs 02/12/2023   Primary hypertension 01/14/2023   Endometrial cancer (HCC) 01/12/2023   Vulvar itching 12/22/2022   Burning with urination 11/13/2022   Vitamin B12 deficiency 11/09/2022   Frequent falls 09/01/2022   AKI (acute kidney injury) 06/27/2022   Hemorrhoids 03/11/2022   Microalbuminuria 01/21/2022   Tremor, essential 12/11/2021   Chronic bilateral low back pain with right-sided sciatica 07/19/2021   Vaginal itching 04/16/2021   Sensory ataxia 03/28/2021   Elevated troponin 12/14/2020   Chronic post-traumatic stress disorder  (PTSD) 12/06/2020   Senile corneal changes 12/06/2020   Postmenopausal bleeding 12/06/2020   Acquired hammer toe 12/06/2020   Opioid dependence 12/06/2020   Hypermetropia 12/06/2020   Generalized weakness 12/06/2020   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   Generalized anxiety disorder 12/06/2020   Functional diarrhea 10/26/2020   Insulin  dependent type 2 diabetes mellitus (HCC) 09/24/2020   Chronic respiratory failure with hypoxia (HCC) 04/25/2020   Chronic constipation 04/25/2020   Chronic pain syndrome 08/22/2019   CAD (coronary artery disease)    Hypocalcemia    S/P shoulder replacement, 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   Mixed hyperlipidemia 05/20/2019   Vertigo 04/25/2019   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   OSA (obstructive sleep apnea) 02/24/2019   At risk for adverse drug event 07/06/2018   Non-alcoholic fatty liver disease 01/12/2018   Mild intermittent asthma 01/12/2018   Senile osteoporosis 12/15/2017   Atrial fibrillation with RVR (HCC) 05/19/2017   Migraine headache with aura 02/12/2016   Diabetic neuropathy (HCC) 02/06/2016   Bipolar 1 disorder (HCC) 10/04/2015   Major depressive disorder 10/03/2015   Herpes genitalis in women 07/16/2015   Long term current use of anticoagulant 05/30/2015   Chronic heart failure with preserved ejection fraction (HFpEF) (HCC) 05/30/2015   RLS (restless legs syndrome) 04/27/2015   Insomnia 01/23/2015   Chronic atrial fibrillation with RVR (HCC) 01/04/2015       Review of Systems  Constitutional:  Negative for diaphoresis.  Eyes:  Negative for pain.  Respiratory:  Negative for shortness of breath.   Cardiovascular:  Negative for chest pain, palpitations and leg  swelling.  Gastrointestinal:  Negative for abdominal pain.  Endocrine: Negative for polydipsia.  Skin:  Negative for rash.  Neurological:  Negative for dizziness, weakness and headaches.  Hematological:  Does not bruise/bleed easily.  All other systems reviewed and  are negative.      Objective:   Physical Exam Constitutional:      Appearance: Normal appearance.  Skin:    General: Skin is warm.     Comments: 1cm annular raw area on medial side of left heel- see picture attached Very tender to touch with light erythema surrounding.   Neurological:     General: No focal deficit present.     Mental Status: She is alert and oriented to person, place, and time.  Psychiatric:        Mood and Affect: Mood normal.        Behavior: Behavior normal.    BP 132/71   Pulse 65   Temp (!) 97.3 F (36.3 C) (Temporal)         Assessment & Plan:   Amber Stephenson in today with chief complaint of Red area to foot (Had mole removed on bottom of left foot 2 weeks ago by dermatology)   1. Cellulitis of other specified site (Primary) Soak in warm epsom salt BID Keep covered Take antibiotics prescribed RTO or see dermatology if not improving  Meds ordered this encounter  Medications   doxycycline  (VIBRA -TABS) 100 MG tablet    Sig: Take 1 tablet (100 mg total) by mouth 2 (two) times daily. 1 po bid    Dispense:  20 tablet    Refill:  0    Supervising Provider:   MARYANNE CHEW A [[id number]]       The above assessment and management plan was discussed with the patient. The patient verbalized understanding of and has agreed to the management plan. Patient is aware to call the clinic if symptoms persist or worsen. Patient is aware when to return to the clinic for a follow-up visit. Patient educated on when it is appropriate to go to the emergency department.   Mary-Margaret Gladis, FNP

## 2024-04-08 NOTE — Patient Instructions (Signed)
 Visit Information  Thank you for taking time to visit with me today. Please don't hesitate to contact me if I can be of assistance to you before our next scheduled appointment.  Your next care management appointment is by telephone on 04-27-2024 at 11:00 AM   Telephone follow-up in 1 month  Please call the care guide team at [phone] if you need to cancel, schedule, or reschedule an appointment.   Please call the Suicide and Crisis Lifeline: 988 call the [place]  National Suicide Prevention Lifeline: 434-598-7541 or TTY: 431-098-1043 TTY 984-691-4769) to talk to a trained counselor call 1-800-273-TALK (toll free, 24 hour hotline) call the [facility]: (289)678-9213 call 911 if you are experiencing a Mental Health or Behavioral Health Crisis or need someone to talk to.  Hendricks Her RN, BSN  [place] [facility] RN Case Manager   Direct Dial [phone]

## 2024-04-08 NOTE — Telephone Encounter (Signed)
 Will call back once it gets back and reviewed.

## 2024-04-08 NOTE — Telephone Encounter (Signed)
 Copied from CRM #[id number]. Topic: Clinical - Lab/Test Results >> Apr 08, 2024  8:03 AM Avram MATSU wrote: Reason for CRM: patient is calling about Xray results, was informed the provider needs to sign off. Please advise [phone]

## 2024-04-11 ENCOUNTER — Telehealth: Payer: Self-pay | Admitting: Family Medicine

## 2024-04-11 NOTE — Telephone Encounter (Signed)
 Copied from CRM #[id number]. Topic: Clinical - Lab/Test Results >> Apr 11, 2024  9:03 AM Graeme ORN wrote: Reason for CRM: Patient called to check on imaging results. Let pt know have not yet finalized. Patient would like a call back with results once received and reviewed. Thank You

## 2024-04-11 NOTE — Telephone Encounter (Signed)
 Will call back once it is ready

## 2024-04-12 ENCOUNTER — Ambulatory Visit: Payer: Self-pay | Admitting: Family Medicine

## 2024-04-13 ENCOUNTER — Ambulatory Visit

## 2024-04-14 ENCOUNTER — Ambulatory Visit: Admitting: Pulmonary Disease

## 2024-04-14 ENCOUNTER — Encounter: Payer: Self-pay | Admitting: Pulmonary Disease

## 2024-04-14 ENCOUNTER — Ambulatory Visit (HOSPITAL_COMMUNITY)

## 2024-04-14 DIAGNOSIS — F251 Schizoaffective disorder, depressive type: Secondary | ICD-10-CM | POA: Diagnosis not present

## 2024-04-18 ENCOUNTER — Encounter: Payer: Self-pay | Admitting: Psychiatry

## 2024-04-18 ENCOUNTER — Ambulatory Visit: Payer: Self-pay

## 2024-04-18 ENCOUNTER — Inpatient Hospital Stay: Attending: Psychiatry | Admitting: Psychiatry

## 2024-04-18 VITALS — BP 122/76 | HR 92 | Resp 19 | Wt 160.4 lb

## 2024-04-18 DIAGNOSIS — Z923 Personal history of irradiation: Secondary | ICD-10-CM | POA: Insufficient documentation

## 2024-04-18 DIAGNOSIS — Z8542 Personal history of malignant neoplasm of other parts of uterus: Secondary | ICD-10-CM | POA: Diagnosis not present

## 2024-04-18 DIAGNOSIS — J9611 Chronic respiratory failure with hypoxia: Secondary | ICD-10-CM

## 2024-04-18 DIAGNOSIS — C541 Malignant neoplasm of endometrium: Secondary | ICD-10-CM

## 2024-04-18 DIAGNOSIS — I5032 Chronic diastolic (congestive) heart failure: Secondary | ICD-10-CM

## 2024-04-18 NOTE — Telephone Encounter (Signed)
Noted  -LS

## 2024-04-18 NOTE — Telephone Encounter (Signed)
 FYI Only or Action Required?: FYI only for provider: appointment scheduled on 04/19/2024 at 2:05 PM.  Patient was last seen in primary care on 04/08/2024 by Gladis Mustard, FNP.  Called Nurse Triage reporting Mouth Injury.  Symptoms began several days ago.  Interventions attempted: Rest, hydration, or home remedies.  Symptoms are: unchanged.  Triage Disposition: See Physician Within 24 Hours  Patient/caregiver understands and will follow disposition?:   Copied from CRM #[id number]. Topic: Clinical - Red Word Triage >> Apr 18, 2024  9:48 AM Alfonso ORN wrote: Red Word that prompted transfer to Nurse Triage:  crack lip   on corner of mouth  with pain severe the crack gets bigger everytime open mouth even when chew or talk the crack gets bigger Reason for Disposition  [1] Mouth looks infected (increasing pain, redness or swelling after 48 hrs) AND [2] no fever  Answer Assessment - Initial Assessment Questions Patient reports pain and some swelling to the right side of her mouth in the corner. Patient endorses the skin breaking in the area with intermittent bleeding. No bleeding currently. Endorses pain level of 7 out of 10. Scheduled to see DOD tomorrow at 2:05 PM.  1. MECHANISM: How did the injury happen?      From a fall 2. ONSET: When did the injury happen? (e.g., minutes, hours ago)      A couple of weeks ago 3. LOCATION: What part of the mouth is injured?      Corner of mouth on the right side 4. APPEARANCE of INJURY: What does your mouth look like?      Crack to the skin and swelling 5. BLEEDING: Is your mouth bleeding? If Yes, ask: Is it difficult to stop?      Yes but at random times 6. SIZE: For cuts, bruises, or swelling, ask: How large is it? (e.g., inches or centimeters; entire lip)      Small area  7. PAIN: Is it painful? If Yes, ask: How bad is the pain?  (Scale 0-10; or none, mild, moderate, severe)     Yes-7 out of 10 8. TETANUS: For any breaks  in the skin, ask: When was your last tetanus booster?     NA 9. OTHER SYMPTOMS: Are you having any trouble breathing?     no  Protocols used: Mouth Injury-A-AH

## 2024-04-18 NOTE — Progress Notes (Signed)
 Gynecologic Oncology Return Clinic Visit  Date of Service: 04/18/2024 Referring Provider: Jennifer Ozan, DO   Assessment & Plan: Amber Stephenson is a 78 y.o. 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  Endometrial cancer: - NED on exam today. - Post treatment MRI pelvis on 07/08/23 NED.  - Last CT A/P on 02/18/24 in the setting of hospitalization with no acute findings. Okay to repeat in 15mo from now to continue to monitor.  - Signs/symptoms of recurrence reviewed. - Continue surveillance with follow-up q715mo initially, can alternate with Dr. Shannon in Radiation Oncology. - Reviewed that after 5 years NED, will be safe to return to Ob/Gyn.  Rectal bleeding: - CT scan as above - Colonoscopy on 02/23/24 with hemorrhoids, multiple non-bleeding colonic angiodysplastic lesions, treated with argon plasma coagulation.  - Following with [facility] - Plan for repeat colonoscopy in late November or early December - Resolved per pt.  RTC 18mo with Dr. Shannon, 15mo with Gyn Onc.  Amber Masters, MD Gynecologic Oncology   Medical Decision Making I personally spent  TOTAL 20 minutes face-to-face and non-face-to-face in the care of this patient, which includes all pre, intra, and post visit time on the date of service.   ----------------------- Reason for Visit: Follow-up, surveillance  Treatment History: Oncology History  Endometrial cancer (HCC)  11/25/2022 Imaging   Pelvic ultrasound: EMS 8.5mm   12/29/2022 Initial Biopsy   A. ENODMETRIUM, BIOPSY:  - Endometrioid carcinoma, FIGO grade 1 (see comment)   COMMENT: This case was reviewed with Dr. Connell who agrees with the above  diagnosis.    12/29/2022 Initial Diagnosis   Endometrial cancer (HCC)    01/13/2023 Imaging   CT chest/abdomen/pelvis: IMPRESSION: 1. Perihilar ground-glass infiltrates in the lungs likely representing edema or multifocal pneumonia. 2. No evidence of metastatic disease seen in the chest, abdomen, or pelvis on noncontrast imaging. 3. Aortic atherosclerosis 4. Multiple chronic appearing endplate compression deformities in the spine likely indicate osteoporosis.   01/26/2023 Cancer Staging   Staging form: Corpus Uteri - Carcinoma and Carcinosarcoma, AJCC 8th Edition - Clinical stage from 01/26/2023: FIGO Stage IB (cT1b, cN0, cM0) - Signed by Stephenson Hoy, MD on 02/03/2023 Histopathologic type: Endometrioid adenocarcinoma, NOS Stage prefix: Initial diagnosis Histologic grade (G): G1 Histologic grading system: 3 grade system   01/26/2023 Imaging   MRI Pelvis: IMPRESSION: 3.9 cm endometrial mass, with suspected 50% myometrial invasion, as above. This is considered borderline for FIGO stage IA/1B by MRI, pending histology.   No evidence of metastatic disease in the pelvis.   02/18/2023 - 03/31/2023 Radiation Therapy   Site/Dose/Technique/Mode:  First Treatment Date: 2023-02-18 - Last Treatment Date: 2023-03-31    Site: Uterus Technique: IMRT Mode: Photon Dose Per Fraction: 1.8 Gy Prescribed Dose (Delivered / Prescribed): 45 Gy / 45 Gy Prescribed Fxs (Delivered / Prescribed): 25 / 25   Site: Uterus - boost treatment  Technique: IMRT Mode: Photon Dose Per Fraction: 2 Gy Prescribed Dose (Delivered / Prescribed): 10 Gy     Interval History: Was discharged from the hospital 02/25/2024 for acute blood loss anemia, melanotic diarrhea, acute on chronic diastolic heart failure, acute kidney injury, Klebsiella UTI.  She underwent colonoscopy which noted nonbleeding colonic angiodysplastic lesions which were treated.  She has since followed up outpatient with GI and has plans for repeat  colonoscopy in late November or December.  Presents today with husband.  Denies vaginal bleeding.  Resolution of rectal bleeding. Some improvement in constipation. Still no appetite but continues on Mounjaro . Eating better per husband.   No new abdominal/pelvic pain, change in bowel or bladder habits, bloating, nausea/vomiting.     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   Benign paroxysmal positional vertigo due to bilateral vestibular disorder 05/20/2019   Bipolar 1 disorder (HCC) 01/23/2015   with GAD, benzo dependence   CAD (coronary artery disease)    Nonobstructive; Managed by Dr. Charls   Cardiomegaly 01/12/2018   CHF (congestive heart failure) 06/08/2022   Chronic back pain 01/04/2015   Chronic constipation 04/25/2020   Chronic diastolic heart failure (HCC) 05/30/2015   Chronic pain syndrome 08/22/2019   back pain, sacroiliitis   Chronic post-traumatic stress disorder (PTSD) 12/06/2020   Diabetic neuropathy (HCC) 02/06/2016   Dyslipidemia 09/24/2020   Essential hypertension    Functional diarrhea 10/26/2020   Herpes genitalis in women 07/16/2015   History of adenomatous polyp of colon 05/21/2019   Overview:   03/31/17: Colonoscopy: nonadvanced adenoma, microscopic colitis, f/u 5 yrs, [facility]/GAP   History of radiation therapy    Endometrial- 02/18/23-03/31/23-Dr. Lynwood Kinard   Hypotension 09/01/2022   Insomnia 01/23/2015   Metabolic acidosis 01/12/2023   Migraine headache with aura 02/12/2016   Myofascial pain dysfunction syndrome 08/22/2019   Non-alcoholic fatty liver disease 01/12/2018   OSA (obstructive sleep apnea) 02/24/2019   10/09/2018 - HST  - AHI 40.6    Osteopenia 12/06/2020   Rx alendronate  35 mg.   Pinguecula 12/06/2020    Presbyopia 12/06/2020   Pulmonary hypertension    RLS (restless legs syndrome) 04/27/2015   RSV (respiratory syncytial virus infection) 06/10/2023   Tremor, essential 12/11/2021   Type II diabetes mellitus (HCC) 09/24/2020    Past Surgical History:  Procedure Laterality Date   BREAST REDUCTION SURGERY     COLONOSCOPY  2018   6 mm cecal tubular adenoma, sigmoid diverticulosis, random colon biopsies consistent with microscopic colitis   COLONOSCOPY N/A 01/22/2024   Procedure: COLONOSCOPY;  Surgeon: Eartha Angelia Sieving, MD;  Location: [facility];  Service: Gastroenterology;  Laterality: N/A;   COLONOSCOPY N/A 02/23/2024   Procedure: COLONOSCOPY;  Surgeon: Eartha Angelia Sieving, MD;  Location: [facility];  Service: Gastroenterology;  Laterality: N/A;   ESOPHAGOGASTRODUODENOSCOPY N/A 01/22/2024   Procedure: EGD (ESOPHAGOGASTRODUODENOSCOPY);  Surgeon: Eartha Angelia, Sieving, MD;  Location: [facility];  Service: Gastroenterology;  Laterality: N/A;   ESOPHAGOGASTRODUODENOSCOPY N/A 02/23/2024   Procedure: EGD (ESOPHAGOGASTRODUODENOSCOPY);  Surgeon: Eartha Angelia, Sieving, MD;  Location: [facility];  Service: Gastroenterology;  Laterality: N/A;   EYE SURGERY Right    cateracts   HAMMER TOE SURGERY     LEFT HEART CATH AND CORONARY ANGIOGRAPHY N/A 02/03/2018   Procedure: LEFT HEART CATH AND CORONARY ANGIOGRAPHY;  Surgeon: Jordan, Peter M, MD;  Location: [facility];  Service: Cardiovascular;  Laterality: N/A;   REVERSE SHOULDER ARTHROPLASTY Right 08/19/2019   Procedure: REVERSE SHOULDER ARTHROPLASTY;  Surgeon: Kay Kemps, MD;  Location: [facility];  Service: Orthopedics;  Laterality: Right;  interscalene block   SHOULDER SURGERY Right    I BROKE MY  SHOUDLER   THIGH SURGERY     TO REMOVE A TUMOR     Family History  Problem Relation Age of Onset   Diabetes Mother    Heart disease Mother    Alzheimer's disease Father    Heart disease Sister        CABG    Diabetes Sister    Alcohol  abuse Sister    Stroke Brother    Heart disease Brother    Mental illness Brother    Diabetes Brother    Breast cancer Neg Hx    Ovarian cancer Neg Hx    Colon cancer Neg Hx    Endometrial cancer Neg Hx     Social History   Socioeconomic History   Marital status: Married    Spouse name: Financial Planner   Number of children: 3   Years of education: 15   Highest education level: Associate degree: academic program  Occupational History   Occupation: Retired    Comment: chief financial officer  Tobacco Use   Smoking status: Never   Smokeless tobacco: Never  Vaping Use   Vaping status: Never Used  Substance and Sexual Activity   Alcohol  use: Not Currently    Alcohol /week: 0.0 standard drinks of alcohol    Drug use: No   Sexual activity: Not Currently    Partners: Male    Birth control/protection: Post-menopausal  Other Topics Concern   Not on file  Social History Narrative   Lives at home with husband.    They have 3 children who live away - [place] , [place] , and [place] .   She is from [place]  and most of her family lives there.   Her husbands's family live nearby   Right handed.   Social Drivers of Corporate Investment Banker Strain: Low Risk  (08/13/2023)   Overall Financial Resource Strain (CARDIA)    Difficulty of Paying Living Expenses: Not hard at all  Food Insecurity: No Food Insecurity (04/08/2024)   Hunger Vital Sign    Worried About Running Out of Food in the Last Year: Never true    Ran Out of Food in the Last Year: Never true  Transportation Needs: No Transportation Needs (04/08/2024)   PRAPARE - Administrator, Civil Service (Medical): No    Lack of Transportation (Non-Medical): No  Physical Activity: Insufficiently Active (08/13/2023)   Exercise Vital Sign    Days of Exercise per Week: 3 days    Minutes of Exercise per Session: 20 min  Stress: No Stress Concern Present (08/13/2023)   [employer] of Occupational Health -  Occupational Stress Questionnaire    Feeling of Stress : Only a little  Social Connections: Socially Integrated (02/15/2024)   Social Connection and Isolation Panel    Frequency of Communication with Friends and Family: More than three times a week    Frequency of Social Gatherings with Friends and Family: Once a week    Attends Religious Services: More than 4 times per year    Active Member of [employer] or Organizations: Yes    Attends Engineer, Structural: More than 4 times per year    Marital Status: Married    Current Medications:  Current Outpatient Medications:    acetaminophen  (TYLENOL ) 325 MG tablet, Take 2 tablets (650 mg total) by mouth every 6 (six) hours as needed for mild pain (pain score 1-3) or fever (or Fever >/= 101)., Disp: , Rfl:    ALPRAZolam  (XANAX ) 1 MG tablet, Take 1 mg by mouth at  bedtime as needed for sleep., Disp: , Rfl:    ARIPiprazole  (ABILIFY ) 2 MG tablet, Take 1 tablet (2 mg total) by mouth at bedtime., Disp: 30 tablet, Rfl: 5   atorvastatin  (LIPITOR) 80 MG tablet, Take 1 tablet (80 mg total) by mouth daily., Disp: 90 tablet, Rfl: 3   carbidopa -levodopa  (SINEMET ) 10-100 MG tablet, Take 1 tablet by mouth 4 (four) times daily. For restless leg syndrome, Disp: 120 tablet, Rfl: 5   colchicine  0.6 MG tablet, Take 1 tablet (0.6 mg total) by mouth every 12 (twelve) hours as needed (gout flare.). For mouth sores, Disp: , Rfl:    Continuous Glucose Sensor (DEXCOM G7 SENSOR) MISC, CHANGE SENSOR EVERY 10 DAYS, Disp: 9 each, Rfl: 3   cyanocobalamin  (VITAMIN B12) 1000 MCG tablet, Take 1 tablet (1,000 mcg total) by mouth daily., Disp: 30 tablet, Rfl: 1   diphenoxylate -atropine  (LOMOTIL ) 2.5-0.025 MG tablet, TAKE 2 TABLETS BY MOUTH FOUR TIMES DAILY AS NEEDED FOR DIARRHEA OR LOOSE STOOLS, Disp: 30 tablet, Rfl: 5   doxycycline  (VIBRA -TABS) 100 MG tablet, Take 1 tablet (100 mg total) by mouth 2 (two) times daily. 1 po bid, Disp: 20 tablet, Rfl: 0   ELIQUIS  5 MG TABS tablet,  TAKE 1 TABLET TWICE A DAY, Disp: 180 tablet, Rfl: 3   escitalopram  (LEXAPRO ) 10 MG tablet, Take 10 mg by mouth daily., Disp: , Rfl:    ferrous sulfate  325 (65 FE) MG EC tablet, Take 1 tablet (325 mg total) by mouth 2 (two) times daily., Disp: 60 tablet, Rfl: 3   furosemide  (LASIX ) 40 MG tablet, Take 1 tablet (40 mg total) by mouth 2 (two) times daily., Disp: 180 tablet, Rfl: 3   gabapentin  (NEURONTIN ) 100 MG capsule, Take 1 capsule (100 mg total) by mouth 3 (three) times daily., Disp: 90 capsule, Rfl: 3   hydrocortisone  (ANUSOL -HC) 2.5 % rectal cream, Place rectally 2 (two) times daily., Disp: 30 g, Rfl: 0   hydrOXYzine  (VISTARIL ) 25 MG capsule, Take 1 capsule (25 mg total) by mouth every 8 (eight) hours as needed for anxiety., Disp: 30 capsule, Rfl: 5   insulin  glargine (LANTUS  SOLOSTAR) 100 UNIT/ML Solostar Pen, Inject 10 Units into the skin at bedtime., Disp: 9 mL, Rfl: 0   insulin  lispro (HUMALOG  KWIKPEN) 100 UNIT/ML KwikPen, Inject 0-10 Units into the skin 2 (two) times daily before a meal. Sliding scale Short acting Humalog  insulin  per sliding scale 0-10 ---:-- Insulin  injection 0-10 Units 0-10 Units Subcutaneous, 3 times daily with meals CBG 70 - 120: 0 unit CBG 121 - 150: 0 unit  CBG 151 - 200: 1 unit CBG 201 - 250: 2 units CBG 251 - 300: 4 units CBG 301 - 350: 6 units  CBG 351 - 400: 8 units  CBG > 400: 10 units, Disp: 18 mL, Rfl: 1   Insulin  Pen Needle 29G X MISC, 1 Device by Does not apply route daily in the afternoon., Disp: 400 each, Rfl: 3   lidocaine  (LIDODERM ) 5 %, Place 1 patch onto the skin daily. Remove & Discard patch within 12 hours or as directed by MD, Disp: 30 patch, Rfl: 0   lidocaine  (XYLOCAINE ) 5 % ointment, Apply 1 Application topically 4 (four) times daily as needed. For foot and ankle nerve pain-  can use up to 4x/day as needed, Disp: 100 g, Rfl: 5   losartan  (COZAAR ) 25 MG tablet, Take 25 mg by mouth daily., Disp: , Rfl:    MAGNESIUM  PO, Take 1 tablet by mouth daily.,  Disp: , Rfl:    Methocarbamol  1000 MG TABS, Take 1,000 mg by mouth every 6 (six) hours as needed., Disp: 120 tablet, Rfl: 5   metoprolol  tartrate (LOPRESSOR ) 50 MG tablet, Take 1 tablet (50 mg total) by mouth 2 (two) times daily., Disp: 180 tablet, Rfl: 3   Misc. Devices (3-IN-1 BEDSIDE TOILET) MISC, Length of need 99 months M17.11 Osteoarthritis right knee / multiple falls / high risk fall, Disp: 1 each, Rfl: 1   ondansetron  (ZOFRAN ) 4 MG tablet, Take 1 tablet (4 mg total) by mouth every 8 (eight) hours as needed for nausea or vomiting. For cancer related nausea, Disp: 30 tablet, Rfl: 1   OXcarbazepine  (TRILEPTAL ) 150 MG tablet, Take 1 tablet (150 mg total) by mouth 2 (two) times daily., Disp: 60 tablet, Rfl: 0   oxyCODONE  (OXYCONTIN ) 20 mg 12 hr tablet, Take 1 tablet (20 mg total) by mouth every 12 (twelve) hours. G89.3- chronic pain- fill 28 days after 04/23/2024, Disp: 60 tablet, Rfl: 0   pantoprazole  (PROTONIX ) 40 MG tablet, Take 1 tablet (40 mg total) by mouth 2 (two) times daily., Disp: 60 tablet, Rfl: 5   potassium chloride  (KLOR-CON ) 10 MEQ tablet, Take 1 tablet (10 mEq total) by mouth daily., Disp: 90 tablet, Rfl: 1   pregabalin  (LYRICA ) 25 MG capsule, Take 1 capsule (25 mg total) by mouth 3 (three) times daily. Will start at at bedtime x 4 days then BID x 4 days, then TID- for nerve pain, Disp: 90 capsule, Rfl: 5   rOPINIRole  (REQUIP ) 2 MG tablet, Take 1 tablet (2 mg total) by mouth at bedtime. (Patient taking differently: Take 2 mg by mouth 2 (two) times daily.), Disp: 90 tablet, Rfl: 0   spironolactone  (ALDACTONE ) 25 MG tablet, Take 0.5 tablets (12.5 mg total) by mouth daily., Disp: 45 tablet, Rfl: 3   tirzepatide  (MOUNJARO ) 12.5 MG/0.5ML Pen, Inject 12.5 mg into the skin once a week., Disp: 6 mL, Rfl: 3   vitamin C  (VITAMIN C ) 500 MG tablet, Take 0.5 tablets (250 mg total) by mouth 2 (two) times daily., Disp: 60 tablet, Rfl: 2   Vitamin D , Cholecalciferol , 25 MCG (1000 UT) TABS, Take  1,000 Units by mouth in the morning., Disp: , Rfl:   Current Facility-Administered Medications:    cyanocobalamin  (VITAMIN B12) injection 1,000 mcg, 1,000 mcg, Intramuscular, Q7 days, , 1,000 mcg at 03/29/24 1402   denosumab  (PROLIA ) injection 60 mg, 60 mg, Subcutaneous, Q6 months, Stacks, Butler, MD, 60 mg at 01/15/24 1459  Facility-Administered Medications Ordered in Other Visits:    bupivacaine -epinephrine  (MARCAINE  W/ EPI) 0.5% -1:200000 injection, , , Anesthesia Intra-op, Tilford Franky BIRCH, MD, 12 mL at 01/21/19 0935  Review of Symptoms: Complete 10-system review is positive for: Shortness of breath, constipation, chills, back pain, fatigue, mouth sores, muscle cramp, dizziness, problems walking, confusion  Physical Exam: BP 122/76 (BP Location: Right Arm, Patient Position: Sitting)   Pulse 92   Resp 19   Wt 160 lb 6.4 oz (72.8 kg)   SpO2 99%   BMI 31.33 kg/m  General: Alert, oriented, no acute distress. HEENT: Normocephalic, atraumatic. Neck symmetric without masses. Sclera anicteric.  Chest: Slight increased work of breathing with long sentences. Clear to auscultation bilaterally. Cardiovascular: Regular rate and rhythm, no murmurs. Abdomen: Soft, mild diffuse tenderness to palpation. No guarding.  Normoactive bowel sounds.  Extremities: warm, no edema Skin: No rashes or lesions noted. Lymphatics: No cervical, supraclavicular, or inguinal adenopathy. GU: Normal appearing external genitalia without erythema, excoriation, or  lesions.  Speculum exam reveals normal vaginal mucosa, atrophic flush normal appearing cervix.  Bimanual exam reveals normal cervix, difficult to palpate uterus due to body habitus and mild abdominal tenderness.  Exam chaperoned by Kimberly Jordan, CMA    Laboratory & Radiologic Studies: CT ABDOMEN PELVIS WO CONTRAST 02/18/2024  Narrative CLINICAL DATA:  Left lower quadrant abdominal pain and diarrhea.  EXAM: CT ABDOMEN AND PELVIS WITHOUT  CONTRAST  TECHNIQUE: Multidetector CT imaging of the abdomen and pelvis was performed following the standard protocol without IV contrast.  RADIATION DOSE REDUCTION: This exam was performed according to the departmental dose-optimization program which includes automated exposure control, adjustment of the mA and/or kV according to patient size and/or use of iterative reconstruction technique.  COMPARISON:  02/15/2024  FINDINGS: Lower chest: Interstitial and patchy alveolar ground-glass opacity is identified in both lung bases, similar to minimally progressive in the interval. No substantial pleural effusion.  Hepatobiliary: No suspicious focal abnormality in the liver on this study without intravenous contrast. There is no evidence for gallstones, gallbladder wall thickening, or pericholecystic fluid. No intrahepatic or extrahepatic biliary dilation.  Pancreas: No focal mass lesion. No dilatation of the main duct. No intraparenchymal cyst. No peripancreatic edema.  Spleen: No splenomegaly. No suspicious focal mass lesion.  Adrenals/Urinary Tract: Right adrenal gland unremarkable. 11 mm low-density left adrenal nodule stable since prior and comparing back to a study from 2022 compatible with benign etiology, likely adenoma. Kidneys unremarkable. No evidence for hydroureter. The urinary bladder appears normal for the degree of distention.  Stomach/Bowel: Stomach is unremarkable. No gastric wall thickening. No evidence of outlet obstruction. Duodenum is normally positioned as is the ligament of Treitz. Duodenal diverticulum noted. No small bowel wall thickening. No small bowel dilatation. The terminal ileum is normal. The appendix is normal. No gross colonic mass. No colonic wall thickening.  Vascular/Lymphatic: There is mild atherosclerotic calcification of the abdominal aorta without aneurysm. There is no gastrohepatic or hepatoduodenal ligament lymphadenopathy. No  retroperitoneal or mesenteric lymphadenopathy. No pelvic sidewall lymphadenopathy.  Reproductive: The uterus is unremarkable.  There is no adnexal mass.  Other: No intraperitoneal free fluid.  Musculoskeletal: Small right groin hernia contains only fat. Diffuse body wall edema evident. No worrisome lytic or sclerotic osseous abnormality.  IMPRESSION: 1. No acute findings in the abdomen or pelvis. Specifically, no findings to explain the patient's history of left lower quadrant pain and diarrhea. 2. Interstitial and patchy alveolar ground-glass opacity in both lung bases, similar to minimally progressive in the interval. Imaging features may reflect edema although infectious/inflammatory etiology not excluded. 3. Diffuse body wall edema. 4. Small right groin hernia contains only fat. 5.  Aortic Atherosclerosis (ICD10-I70.0).   Electronically Signed By: Camellia Candle M.D. On: 02/19/2024 09:54

## 2024-04-18 NOTE — Patient Instructions (Signed)
 It was a pleasure to see you in clinic today. - Reassuring exam today - Return visit planned for 79mo with Dr. Shannon and 52mo with Dr. Eldonna. CT scan in 52mo prior to visit with Dr. Eldonna  Thank you very much for allowing me to provide care for you today.  I appreciate your confidence in choosing our Gynecologic Oncology team at [facility].  If you have any questions about your visit today please call our office or send us  a MyChart message and we will get back to you as soon as possible.

## 2024-04-19 ENCOUNTER — Encounter: Payer: Self-pay | Admitting: Family

## 2024-04-19 ENCOUNTER — Ambulatory Visit: Admitting: Family

## 2024-04-19 VITALS — BP 121/85 | HR 74 | Temp 97.0°F | Ht 60.0 in | Wt 159.2 lb

## 2024-04-19 DIAGNOSIS — B370 Candidal stomatitis: Secondary | ICD-10-CM

## 2024-04-19 DIAGNOSIS — H04123 Dry eye syndrome of bilateral lacrimal glands: Secondary | ICD-10-CM | POA: Diagnosis not present

## 2024-04-19 DIAGNOSIS — K130 Diseases of lips: Secondary | ICD-10-CM

## 2024-04-19 DIAGNOSIS — H532 Diplopia: Secondary | ICD-10-CM | POA: Diagnosis not present

## 2024-04-19 MED ORDER — NYSTATIN 100000 UNIT/ML MT SUSP: 5.0000 mL | Freq: Four times a day (QID) | OROMUCOSAL | 0 refills | Status: AC

## 2024-04-19 MED ORDER — KETOCONAZOLE 2 % EX CREA: 1.0000 | TOPICAL_CREAM | Freq: Every day | CUTANEOUS | 0 refills | Status: DC

## 2024-04-19 MED ORDER — FLUCONAZOLE 150 MG PO TABS: 150.0000 mg | ORAL_TABLET | ORAL | 0 refills | Status: DC

## 2024-04-19 NOTE — Patient Instructions (Signed)
 Oral Thrush, Adult Oral thrush, also called oral candidiasis, is a fungal infection that develops in the mouth and throat and on the tongue. It causes white patches to form in the mouth and on the tongue. Many cases of thrush are mild, but this infection can also be serious. Amber Stephenson can be a repeated (recurrent) problem for certain people who have a weak body defense system (immune system). The weakness can be caused by chronic illnesses, or by taking medicines that limit the body's ability to fight infection. If a person has difficulty fighting infection, the fungus that causes thrush can spread through the body. This can cause life-threatening blood or organ infections. What are the causes? This condition is caused by a fungus (yeast) called Candida albicans. This fungus is normally present in small amounts in the mouth and on other mucous membranes. It usually causes no harm. If conditions are present that allow the fungus to grow without control, it invades surrounding tissues and becomes an infection. Other Candida species can also lead to thrush, though this is rare. What increases the risk? The following factors may make you more likely to develop this condition: Having a weakened immune system. Being an older adult. Having diabetes, cancer, or HIV (human immunodeficiency virus). Having dry mouth (xerostomia). Being pregnant or breastfeeding. Having poor dental care, especially in those who have dentures. Using antibiotic or steroid medicines. What are the signs or symptoms? Symptoms of this condition can vary from mild and moderate to severe and persistent. Symptoms may include: A burning feeling in the mouth and throat. This can occur at the start of a thrush infection. White patches that stick to the mouth and tongue. The tissue around the patches may be red, raw, and painful. If rubbed (during tooth brushing, for example), the patches and the tissue of the mouth may bleed easily. A bad  taste in the mouth or difficulty tasting foods. A cottony feeling in the mouth. Pain during eating and swallowing. Poor appetite. Cracking at the corners of the mouth. How is this diagnosed? This condition is diagnosed based on: A physical exam. Your medical history. How is this treated? This condition is treated with medicines called antifungals, which prevent the growth of fungi. These medicines are either applied directly to the affected area (topical) or swallowed (oral). The treatment will depend on the severity of the condition. Mild cases of thrush may be treated with an antifungal mouth rinse or lozenges. Treatment usually lasts about 14 days. Moderate to severe cases of thrush can be treated with oral antifungal medicine, if they have spread to the esophagus. A topical antifungal medicine may also be used. For some severe infections, treatment may need to continue for more than 14 days. Oral antifungal medicines are rarely used during pregnancy because they may be harmful to the unborn child. If you are pregnant, talk with your health care provider about options for treatment. Persistent or recurrent thrush. For cases of thrush that do not go away or keep coming back: Treatment may be needed twice as long as the symptoms last. Treatment will include both oral and topical antifungal medicines. People with a weakened immune system can take an antifungal medicine on a continuous basis to prevent thrush infections. It is important to treat conditions that make a person more likely to get thrush, such as diabetes or HIV. Follow these instructions at home: Relieving soreness and discomfort To help reduce the discomfort of thrush: Drink cold liquids such as water or iced tea.  Try flavored ice treats or frozen juices. Eat foods that are easy to swallow, such as gelatin, ice cream, or custard. Try drinking from a straw if the patches in your mouth are painful.  General instructions Take  or use over-the-counter and prescription medicines only as told by your health care provider. Eat plain, unflavored yogurt as directed by your health care provider. Check the label to make sure the yogurt contains live cultures. This yogurt can help healthy bacteria grow in the mouth and can stop the growth of the fungus that causes thrush. If you wear dentures, remove the dentures before going to bed, brush them vigorously, and soak them in a cleaning solution as directed by your health care provider. Rinse your mouth with a warm salt-water mixture several times a day. To make a salt-water mixture, dissolve -1 tsp (3-6 g) of salt in 1 cup (237 mL) of warm water. Contact a health care provider if: Your symptoms are getting worse or are not improving within 7 days of starting treatment. You have symptoms of a spreading infection, such as white patches on the skin outside of the mouth. You are breastfeeding your baby and you have redness and pain in the nipples. Summary Oral thrush, also called oral candidiasis, is a fungal infection that develops in the mouth and throat and on the tongue. It causes white patches to form in the mouth and on the tongue. You are more likely to get this condition if you have a weakened immune system or an underlying condition, such as HIV, cancer, or diabetes. This condition is treated with medicines called antifungals, which prevent the growth of fungi. Contact a health care provider if your symptoms do not improve, or get worse, within 7 days of starting treatment. This information is not intended to replace advice given to you by your health care provider. Make sure you discuss any questions you have with your health care provider. Document Revised: 05/05/2022 Document Reviewed: 05/05/2022 Elsevier Patient Education  2024 [employer].

## 2024-04-19 NOTE — Progress Notes (Signed)
 Subjective:    Patient ID: Amber Stephenson, female    DOB: 1946-05-29, 78 y.o.   MRN: [id number]  Chief Complaint  Patient presents with   LIP SORE     IN THE CORNERS     HPI PT presents to the office today with mouth soreness and both corners of her mouth for the last 6 months. She has used mouth wash, magic mouthwash, chapstick with mild relief.    Review of Systems  All other systems reviewed and are negative.   Social History   Socioeconomic History   Marital status: Married    Spouse name: Amber Stephenson   Number of children: 3   Years of education: 15   Highest education level: Associate degree: academic program  Occupational History   Occupation: Retired    Comment: chief financial officer  Tobacco Use   Smoking status: Never   Smokeless tobacco: Never  Vaping Use   Vaping status: Never Used  Substance and Sexual Activity   Alcohol  use: Not Currently    Alcohol /week: 0.0 standard drinks of alcohol    Drug use: No   Sexual activity: Not Currently    Partners: Male    Birth control/protection: Post-menopausal  Other Topics Concern   Not on file  Social History Narrative   Lives at home with husband.    They have 3 children who live away - [place] , [place] , and [place] .   She is from [place]  and most of her family lives there.   Her husbands's family live nearby   Right handed.   Social Drivers of Corporate Investment Banker Strain: Low Risk  (08/13/2023)   Overall Financial Resource Strain (CARDIA)    Difficulty of Paying Living Expenses: Not hard at all  Food Insecurity: No Food Insecurity (04/08/2024)   Hunger Vital Sign    Worried About Running Out of Food in the Last Year: Never true    Ran Out of Food in the Last Year: Never true  Transportation Needs: No Transportation Needs (04/08/2024)   PRAPARE - Administrator, Civil Service (Medical): No    Lack of Transportation (Non-Medical): No  Physical Activity: Insufficiently Active (08/13/2023)   Exercise  Vital Sign    Days of Exercise per Week: 3 days    Minutes of Exercise per Session: 20 min  Stress: No Stress Concern Present (08/13/2023)   Amber Stephenson of Occupational Health - Occupational Stress Questionnaire    Feeling of Stress : Only a little  Social Connections: Socially Integrated (02/15/2024)   Social Connection and Isolation Panel    Frequency of Communication with Friends and Family: More than three times a week    Frequency of Social Gatherings with Friends and Family: Once a week    Attends Religious Services: More than 4 times per year    Active Member of [employer] or Organizations: Yes    Attends Engineer, Structural: More than 4 times per year    Marital Status: Married   Family History  Problem Relation Age of Onset   Diabetes Mother    Heart disease Mother    Alzheimer's disease Father    Heart disease Sister        CABG   Diabetes Sister    Alcohol  abuse Sister    Stroke Brother    Heart disease Brother    Mental illness Brother    Diabetes Brother    Breast cancer Neg Hx    Ovarian cancer Neg Hx  Colon cancer Neg Hx    Endometrial cancer Neg Hx         Objective:   Physical Exam Vitals reviewed.  Constitutional:      General: She is not in acute distress.    Appearance: She is well-developed.  HENT:     Head: Normocephalic and atraumatic.      Right Ear: Tympanic membrane normal.     Left Ear: Tympanic membrane normal.  Eyes:     Pupils: Pupils are equal, round, and reactive to light.  Neck:     Thyroid : No thyromegaly.  Cardiovascular:     Rate and Rhythm: Normal rate and regular rhythm.     Heart sounds: Normal heart sounds. No murmur heard. Pulmonary:     Effort: Pulmonary effort is normal. No respiratory distress.     Breath sounds: Normal breath sounds. No wheezing.  Abdominal:     General: Bowel sounds are normal. There is no distension.     Palpations: Abdomen is soft.     Tenderness: There is no abdominal tenderness.   Musculoskeletal:        General: No tenderness. Normal range of motion.     Cervical back: Normal range of motion and neck supple.  Skin:    General: Skin is warm and dry.     Findings: Rash present.     Comments: Sore on bilateral corner on mouth  Neurological:     Mental Status: She is alert and oriented to person, place, and time.     Cranial Nerves: No cranial nerve deficit.     Deep Tendon Reflexes: Reflexes are normal and symmetric.  Psychiatric:        Behavior: Behavior normal.        Thought Content: Thought content normal.        Judgment: Judgment normal.       BP 121/85   Pulse 74   Temp (!) 97 F (36.1 C) (Temporal)   Ht 5' (1.524 m)   Wt 159 lb 3.2 oz (72.2 kg)   BMI 31.09 kg/m      Assessment & Plan:  Amber Stephenson comes in today with chief complaint of LIP SORE  (IN THE CORNERS )   Diagnosis and orders addressed:  1. Angular cheilitis (Primary) - nystatin  (MYCOSTATIN ) 100000 UNIT/ML suspension; Take 5 mLs (500,000 Units total) by mouth 4 (four) times daily.  Dispense: 473 mL; Refill: 0 - fluconazole  (DIFLUCAN ) 150 MG tablet; Take 1 tablet (150 mg total) by mouth every 3 (three) days.  Dispense: 3 tablet; Refill: 0 - ketoconazole  (NIZORAL ) 2 % cream; Apply 1 Application topically daily.  Dispense: 15 g; Refill: 0  2. Oral thrush - nystatin  (MYCOSTATIN ) 100000 UNIT/ML suspension; Take 5 mLs (500,000 Units total) by mouth 4 (four) times daily.  Dispense: 473 mL; Refill: 0 - fluconazole  (DIFLUCAN ) 150 MG tablet; Take 1 tablet (150 mg total) by mouth every 3 (three) days.  Dispense: 3 tablet; Refill: 0 - ketoconazole  (NIZORAL ) 2 % cream; Apply 1 Application topically daily.  Dispense: 15 g; Refill: 0   Start using Ketoconazole  BID Diflucan  every three days Need good control of glucose  Can use nystatin  QID  Follow up if symptoms worsen or do not improve    Bari Learn, FNP

## 2024-04-20 ENCOUNTER — Ambulatory Visit: Admitting: Family Medicine

## 2024-04-21 ENCOUNTER — Encounter: Attending: Physical Medicine and Rehabilitation | Admitting: Physical Medicine & Rehabilitation

## 2024-04-21 DIAGNOSIS — M545 Low back pain, unspecified: Secondary | ICD-10-CM | POA: Insufficient documentation

## 2024-04-21 DIAGNOSIS — M79671 Pain in right foot: Secondary | ICD-10-CM | POA: Insufficient documentation

## 2024-04-21 DIAGNOSIS — M79672 Pain in left foot: Secondary | ICD-10-CM | POA: Insufficient documentation

## 2024-04-21 DIAGNOSIS — G894 Chronic pain syndrome: Secondary | ICD-10-CM | POA: Insufficient documentation

## 2024-04-21 DIAGNOSIS — Z79891 Long term (current) use of opiate analgesic: Secondary | ICD-10-CM | POA: Insufficient documentation

## 2024-04-21 DIAGNOSIS — M25561 Pain in right knee: Secondary | ICD-10-CM | POA: Insufficient documentation

## 2024-04-21 DIAGNOSIS — M25551 Pain in right hip: Secondary | ICD-10-CM | POA: Insufficient documentation

## 2024-04-21 DIAGNOSIS — Z5181 Encounter for therapeutic drug level monitoring: Secondary | ICD-10-CM | POA: Insufficient documentation

## 2024-04-22 ENCOUNTER — Ambulatory Visit (HOSPITAL_COMMUNITY)

## 2024-04-22 ENCOUNTER — Encounter (HOSPITAL_COMMUNITY): Payer: Self-pay

## 2024-04-22 DIAGNOSIS — M6281 Muscle weakness (generalized): Secondary | ICD-10-CM | POA: Diagnosis not present

## 2024-04-22 DIAGNOSIS — R2681 Unsteadiness on feet: Secondary | ICD-10-CM

## 2024-04-22 NOTE — Therapy (Signed)
 OUTPATIENT PHYSICAL THERAPY LOWER EXTREMITY TREATMENT   Patient Name: Amber Stephenson MRN: [id number] DOB:[birth year], 78 y.o., female Today's Date: 04/22/2024  END OF SESSION:  PT End of Session - 04/22/24 1501     Visit Number 4    Number of Visits 8    Date for Recertification  05/27/24    Authorization Type Medicare part A and B, with [employer] for life secondary    Authorization Time Period no authorization required    PT Start Time 1502    PT Stop Time 1532    PT Time Calculation (min) 30 min    Equipment Utilized During Treatment Gait belt    Activity Tolerance Patient limited by fatigue;Patient limited by pain    Behavior During Therapy [facility] for tasks assessed/performed           Past Medical History:  Diagnosis Date   Allergic rhinitis 02/24/2019   Ambulatory dysfunction 04/23/2020   Atrial fibrillation with RVR (HCC) 05/19/2017   Back pain/sacroiliitis--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   Benign paroxysmal positional vertigo due to bilateral vestibular disorder 05/20/2019   Bipolar 1 disorder (HCC) 01/23/2015   with GAD, benzo dependence   CAD (coronary artery disease)    Nonobstructive; Managed by Dr. Charls   Cardiomegaly 01/12/2018   CHF (congestive heart failure) 06/08/2022   Chronic back pain 01/04/2015   Chronic constipation 04/25/2020   Chronic diastolic heart failure (HCC) 05/30/2015   Chronic pain syndrome 08/22/2019   back pain, sacroiliitis   Chronic post-traumatic stress disorder (PTSD) 12/06/2020   Diabetic neuropathy (HCC) 02/06/2016   Dyslipidemia 09/24/2020   Essential hypertension    Functional diarrhea 10/26/2020   Herpes genitalis in women 07/16/2015   History of adenomatous polyp of colon 05/21/2019   Overview:   03/31/17: Colonoscopy: nonadvanced adenoma, microscopic colitis, f/u 5 yrs, Murphy/GAP   History of radiation therapy     Endometrial- 02/18/23-03/31/23-Dr. Lynwood Kinard   Hypotension 09/01/2022   Insomnia 01/23/2015   Metabolic acidosis 01/12/2023   Migraine headache with aura 02/12/2016   Myofascial pain dysfunction syndrome 08/22/2019   Non-alcoholic fatty liver disease 01/12/2018   OSA (obstructive sleep apnea) 02/24/2019   10/09/2018 - HST  - AHI 40.6    Osteopenia 12/06/2020   Rx alendronate  35 mg.   Pinguecula 12/06/2020   Presbyopia 12/06/2020   Pulmonary hypertension    RLS (restless legs syndrome) 04/27/2015   RSV (respiratory syncytial virus infection) 06/10/2023   Tremor, essential 12/11/2021   Type II diabetes mellitus (HCC) 09/24/2020   Past Surgical History:  Procedure Laterality Date   BREAST REDUCTION SURGERY     COLONOSCOPY  2018   6 mm cecal tubular adenoma, sigmoid diverticulosis, random colon biopsies consistent with microscopic colitis   COLONOSCOPY N/A 01/22/2024   Procedure: COLONOSCOPY;  Surgeon: Eartha Angelia Sieving, MD;  Location: [facility];  Service: Gastroenterology;  Laterality: N/A;   COLONOSCOPY N/A 02/23/2024   Procedure: COLONOSCOPY;  Surgeon: Eartha Angelia Sieving, MD;  Location: [facility];  Service: Gastroenterology;  Laterality: N/A;   ESOPHAGOGASTRODUODENOSCOPY N/A 01/22/2024   Procedure: EGD (ESOPHAGOGASTRODUODENOSCOPY);  Surgeon: Eartha Angelia, Sieving, MD;  Location: [facility];  Service: Gastroenterology;  Laterality: N/A;   ESOPHAGOGASTRODUODENOSCOPY N/A 02/23/2024   Procedure: EGD (ESOPHAGOGASTRODUODENOSCOPY);  Surgeon: Eartha Angelia, Sieving, MD;  Location: [facility];  Service: Gastroenterology;  Laterality: N/A;   EYE SURGERY Right  cateracts   HAMMER TOE SURGERY     LEFT HEART CATH AND CORONARY ANGIOGRAPHY N/A 02/03/2018   Procedure: LEFT HEART CATH AND CORONARY ANGIOGRAPHY;  Surgeon: Jordan, Peter M, MD;  Location: [facility]IVE CV [facility];  Service: Cardiovascular;  Laterality: N/A;   REVERSE SHOULDER ARTHROPLASTY Right 08/19/2019    Procedure: REVERSE SHOULDER ARTHROPLASTY;  Surgeon: Kay Kemps, MD;  Location: [facility];  Service: Orthopedics;  Laterality: Right;  interscalene block   SHOULDER SURGERY Right    I BROKE MY SHOUDLER   THIGH SURGERY     TO REMOVE A TUMOR    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 02/04/2024   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   Osteopenia 02/04/2024   Pain of foot 02/04/2024   Pain of right breast 02/04/2024   Phase of life problem 02/04/2024   Acute gastric ulcer without hemorrhage or perforation 01/23/2024   Hematochezia 01/21/2024   Acute anemia 01/21/2024   ABLA (acute blood loss anemia) 01/19/2024   Essential hypertension 01/07/2024   Rectal bleeding 10/07/2023   Nocturnal enuresis 08/25/2023   Hyponatremia 06/10/2023   Abdominal pain 06/10/2023   Diarrhea 06/10/2023   Candidiasis of mouth 05/15/2023   Type 2 diabetes mellitus with hyperglycemia, with long-term current use of insulin  (HCC) 02/12/2023   Long term (current) use of oral hypoglycemic drugs 02/12/2023   Primary hypertension 01/14/2023   Endometrial cancer (HCC) 01/12/2023   Vulvar itching 12/22/2022   Burning with urination 11/13/2022   Vitamin B12 deficiency 11/09/2022   Frequent falls 09/01/2022   AKI (acute kidney injury) 06/27/2022   Hemorrhoids 03/11/2022   Microalbuminuria 01/21/2022   Tremor, essential 12/11/2021   Chronic bilateral low back pain with right-sided sciatica 07/19/2021   Vaginal itching 04/16/2021   Sensory ataxia 03/28/2021   Elevated troponin 12/14/2020   Chronic post-traumatic stress disorder (PTSD) 12/06/2020   Senile corneal changes 12/06/2020   Postmenopausal  bleeding 12/06/2020   Acquired hammer toe 12/06/2020   Opioid dependence 12/06/2020   Hypermetropia 12/06/2020   Generalized weakness 12/06/2020   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   Generalized anxiety disorder 12/06/2020   Functional diarrhea 10/26/2020   Insulin  dependent type 2 diabetes mellitus (HCC) 09/24/2020   Chronic respiratory failure with hypoxia (HCC) 04/25/2020   Chronic constipation 04/25/2020   Chronic pain syndrome 08/22/2019   CAD (coronary artery disease)    Hypocalcemia    S/P shoulder replacement, 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   Mixed hyperlipidemia 05/20/2019   Vertigo 04/25/2019   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   OSA (obstructive sleep apnea) 02/24/2019   At risk for adverse drug event 07/06/2018   Non-alcoholic fatty liver disease 01/12/2018   Mild intermittent asthma 01/12/2018   Senile osteoporosis 12/15/2017   Atrial fibrillation with RVR (HCC) 05/19/2017   Migraine headache with aura 02/12/2016   Diabetic neuropathy (HCC) 02/06/2016   Bipolar 1 disorder (HCC) 10/04/2015   Major depressive disorder 10/03/2015   Herpes genitalis in women 07/16/2015   Long term current use of anticoagulant 05/30/2015   Chronic heart failure with preserved ejection fraction (HFpEF) (HCC) 05/30/2015   RLS (restless legs syndrome) 04/27/2015   Insomnia 01/23/2015   Chronic atrial fibrillation with RVR (HCC) 01/04/2015  PCP: [name], MD  REFERRING PROVIDER: Margrette Taft BRAVO, MD   REFERRING DIAG: Gait disturbance, Contusion of right knee, initial encounter, Primary osteoarthritis of right knee   THERAPY DIAG:  Unsteadiness on feet  Muscle weakness (generalized)  Rationale for Evaluation and Treatment: Rehabilitation  ONSET DATE: November 2024  SUBJECTIVE:   SUBJECTIVE STATEMENT: Patient reports that she is really hurting in her back today.  EVAL: Patient reports that her balance is her primary concern as her right knee has gotten better.  She feels that her balance has been getting worse since November 2024. She has tried using a cane and a walker, but she does not feel steady or safe using an assistive device. She is primarily a furniture walker for balance. She has fallen 3-4 times in the past 6 months. She notes that she has had multiple colonoscopies over the previous months due to abdominal pain. She has been having a lot of pain over the past few days and she is scheduled for another colonoscopy in December.   PERTINENT HISTORY: Atrial fibrillation, CHF, diabetic neuropathy, osteoporosis, bipolar 1 disorder, asthma, PTSD, anxiety, and history of cancer PAIN:  Are you having pain? Yes: NPRS scale: 9/10 Pain location: back  Pain description: sharp Aggravating factors: walking Relieving factors: rest and elevation  PRECAUTIONS: Fall  RED FLAGS: None   WEIGHT BEARING RESTRICTIONS: No  FALLS:  Has patient fallen in last 6 months? Yes. Number of falls 3-4  LIVING ENVIRONMENT: Lives with: lives with their spouse Lives in: House/apartment Stairs: Yes: Internal: 1 flight steps; on right going up; patient avoids these steps due to difficulty and instability Has following equipment at home: Quad cane small base, Walker - 4 wheeled, shower chair, and Grab bars  OCCUPATION: retired  PLOF: Independent  PATIENT GOALS: be able to go shopping and improved safety  NEXT MD VISIT: none scheduled  OBJECTIVE:  Note: Objective measures were completed at Evaluation unless otherwise noted.  DIAGNOSTIC FINDINGS: 01/29/24 right knee x-ray IMPRESSION: Moderate prepatellar soft tissue swelling is noted suggesting traumatic injury. No fracture or dislocation is noted.  PATIENT SURVEYS:  ABC scale: The Activities-Specific Balance Confidence (ABC) Scale 0% 10 20 30  40 50 60 70 80 90 100% No confidence<->completely confident  "How confident are you that you will not lose your balance or become unsteady when you . . .   Date  tested 03/28/24  Walk around the house 20%  2. Walk up or down stairs 10%  3. Bend over and pick up a slipper from in front of a closet floor 10%  4. Reach for a small can off a shelf at eye level 80%  5. Stand on tip toes and reach for something above your head 40%  6. Stand on a chair and reach for something 0%  7. Sweep the floor 0%  8. Walk outside the house to a car parked in the driveway 50%  9. Get into or out of a car 60%  10. Walk across a parking lot to the mall 0%  11. Walk up or down a ramp 40%  12. Walk in a crowded mall where people rapidly walk past you 10%  13. Are bumped into by people as you walk through the mall 0%  14. Step onto or off of an escalator while you are holding onto the railing 40%  15. Step onto or off an escalator while holding onto parcels such that you cannot hold onto the railing 0%  16. Walk  outside on icy sidewalks 0%  Total: #/16 22.5%      COGNITION: Overall cognitive status: Within functional limits for tasks assessed     SENSATION: Light touch: Impaired  and increased sensitivity to light touch in bilateral lower 1/3 of the tibia Patient reports numbness or tingling in her feet and lower legs.   EDEMA:  No edema observed  POSTURE: rounded shoulders, forward head, and flexed trunk   LOWER EXTREMITY ROM: unable to assess secondary to pain   LOWER EXTREMITY MMT: high pain severity and irritability with MMT which limited full assessment   MMT Right eval Left eval  Hip flexion 4-/5 3+/5  Hip extension    Hip abduction    Hip adduction    Hip internal rotation    Hip external rotation    Knee flexion    Knee extension    Ankle dorsiflexion 4-/5 4-/5  Ankle plantarflexion    Ankle inversion    Ankle eversion     (Blank rows = not tested)  FUNCTIONAL TESTS: will be completed a following appointments, as able 5 times sit to stand: 16 seconds on 04/04/24, BUE push up from arm rests Timed up and go (TUG): 21 seconds on 04/04/24, no  AD 2 minute walk test: 194 ft on 04/04/24, no AD, pt SOB  GAIT: Assistive device utilized: None Level of assistance: SBA Comments: very unsteady with ambulation, required external support from the wall and other objects for safety                                                                                                                                TREATMENT DATE:                                   04/22/24 EXERCISE LOG  Exercise Repetitions and Resistance Comments  Nustep  L1 x 9 minutes  1 lap; UE and LE   Gait training   With large based quad cane    Patient education  See below             Blank cell = exercise not performed today   04/06/24: Gait training QC 98ft, step to pattern, 3 point sequence Gait training QC 26ft, step thru pattern 3 point sequence  04/04/24: Review of goals Gait trial with QC, 70 ft, v cues for gait pattern  Pt SOB, SPO2 93%, HR 84 bpm 5 times sit to stand, see above TUG, see above 2 minute walk test, see above Gait trial with SPC, 70 ft, v reminders for pattern Seated heel/toe raises, 2x10 LAQ, 10x each LE Standing hip abduction at // bars, 10x each LE     03/28/24: PT evaluation and patient education  PATIENT EDUCATION:  Education details: POC, prognosis, pain, progress with physical therapy, and following up with her referring physician Person educated: Patient Education method:  Explanation Education comprehension: verbalized understanding  HOME EXERCISE PROGRAM: Access Code: [email or web address] URL: [email or web address] Date: 04/04/2024 Prepared by: Rosaria Powell-Butler  Exercises - Sit to Stand  - 2 x daily - 7 x weekly - 2 sets - 10 reps - Seated Heel Toe Raises  - 2 x daily - 7 x weekly - 2 sets - 10 reps - Seated Long Arc Quad  - 2 x daily - 7 x weekly - 2 sets - 10 reps - Standing Hip Abduction with Counter Support  - 2 x daily - 7 x weekly - 2 sets - 10 reps  ASSESSMENT:  CLINICAL IMPRESSION: Patient presented  to treatment with high pain severity and irritability which significantly limited her ability to complete today's interventions. Gait training with the quad cane was attempted, but she fatigued quickly and required min-modA for safety due to unsteadiness on her feet. She was educated to follow up with her referring physician due to her continued high pain severity and irritability which is limiting her ability to complete physical therapy. She reported that she was really hurting upon the conclusion of treatment. Recommend that she continue with skilled physical therapy, following her follow up with her referring physician, to address her remaining impairments to maximize her safety and functional mobility.    EVAL: Patient is a 78 y.o. female who was seen today for physical therapy evaluation and treatment for unsteadiness on her feet and history of falling. She is a high fall risk as evidenced by her gait mechanics and her history of falling. Her full objective assessment was unable to be completed due to her high pain severity and irritability. She requested to leave early secondary to her pain levels. Recommend that she continue with skilled physical therapy to address her impairments to maximize her safety and functional mobility.    OBJECTIVE IMPAIRMENTS: Abnormal gait, decreased activity tolerance, decreased balance, decreased knowledge of use of DME, decreased mobility, difficulty walking, decreased strength, postural dysfunction, and pain.   ACTIVITY LIMITATIONS: carrying, bending, standing, stairs, transfers, and locomotion level  PARTICIPATION LIMITATIONS: meal prep, cleaning, laundry, shopping, and community activity  PERSONAL FACTORS: Past/current experiences, Time since onset of injury/illness/exacerbation, and 3+ comorbidities: Atrial fibrillation, CHF, diabetic neuropathy, osteoporosis, bipolar 1 disorder, asthma, PTSD, anxiety, and history of cancer are also affecting patient's functional  outcome.   REHAB POTENTIAL: Fair    CLINICAL DECISION MAKING: Unstable/unpredictable  EVALUATION COMPLEXITY: High   GOALS: Goals reviewed with patient? Yes  LONG TERM GOALS: Target date: 04/25/24  Patient will be independent with her HEP.  Baseline:  Goal status: INITIAL  2.  Patient will improve her ABC by at least 10% for improved perceived function with her daily activities.  Baseline:  Goal status: INITIAL  3.  Patient will be able to complete the timed up and go in 12 seconds or less to reduce her fall risk.  Baseline:  Goal status: INITIAL  4.  Patient will improve her timed up and go to 12 seconds or less to reduce her fall risk.  Baseline:  Goal status: INITIAL  5.  Patient will be able to demonstrate proper utilization of the least restrictive assistive device to improve her community mobility.  Baseline:  Goal status: INITIAL  PLAN:  PT FREQUENCY: 2x/week  PT DURATION: 4 weeks  PLANNED INTERVENTIONS: 97164- PT Re-evaluation, 97750- Physical Performance Testing, 97110-Therapeutic exercises, 97530- Therapeutic activity, [id number]- Neuromuscular re-education, 97535- Self Care, [id number]- Manual therapy, [phone]- Gait training, Patient/Family education, Balance training, Stair training, Cryotherapy, and  Moist heat  PLAN FOR NEXT SESSION: gait training with QC, balance interventions, lower extremity strengthening  Lacinda Fass, PT, DPT  3:41 PM, 04/22/24

## 2024-04-26 ENCOUNTER — Ambulatory Visit (HOSPITAL_COMMUNITY): Admitting: Physical Therapy

## 2024-04-26 DIAGNOSIS — R2681 Unsteadiness on feet: Secondary | ICD-10-CM | POA: Diagnosis not present

## 2024-04-26 DIAGNOSIS — M6281 Muscle weakness (generalized): Secondary | ICD-10-CM | POA: Diagnosis not present

## 2024-04-26 NOTE — Therapy (Signed)
 OUTPATIENT PHYSICAL THERAPY LOWER EXTREMITY TREATMENT   Patient Name: Amber Stephenson MRN: [id number] DOB:Mar 04, 1946, 78 y.o., female Today's Date: 04/26/2024  END OF SESSION:  PT End of Session - 04/26/24 1524     Visit Number 5    Number of Visits 8    Date for Recertification  05/27/24    Authorization Type Medicare part A and B, with [employer] for life secondary    Authorization Time Period no authorization required    PT Start Time 1518    PT Stop Time 1550    PT Time Calculation (min) 32 min    Equipment Utilized During Treatment Gait belt    Activity Tolerance Patient limited by fatigue;Patient limited by pain    Behavior During Therapy [facility] for tasks assessed/performed           Past Medical History:  Diagnosis Date   Allergic rhinitis 02/24/2019   Ambulatory dysfunction 04/23/2020   Atrial fibrillation with RVR (HCC) 05/19/2017   Back pain/sacroiliitis--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   Benign paroxysmal positional vertigo due to bilateral vestibular disorder 05/20/2019   Bipolar 1 disorder (HCC) 01/23/2015   with GAD, benzo dependence   CAD (coronary artery disease)    Nonobstructive; Managed by Dr. Charls   Cardiomegaly 01/12/2018   CHF (congestive heart failure) 06/08/2022   Chronic back pain 01/04/2015   Chronic constipation 04/25/2020   Chronic diastolic heart failure (HCC) 05/30/2015   Chronic pain syndrome 08/22/2019   back pain, sacroiliitis   Chronic post-traumatic stress disorder (PTSD) 12/06/2020   Diabetic neuropathy (HCC) 02/06/2016   Dyslipidemia 09/24/2020   Essential hypertension    Functional diarrhea 10/26/2020   Herpes genitalis in women 07/16/2015   History of adenomatous polyp of colon 05/21/2019   Overview:   03/31/17: Colonoscopy: nonadvanced adenoma, microscopic colitis, f/u 5 yrs, Murphy/GAP   History of radiation therapy     Endometrial- 02/18/23-03/31/23-Dr. Lynwood Kinard   Hypotension 09/01/2022   Insomnia 01/23/2015   Metabolic acidosis 01/12/2023   Migraine headache with aura 02/12/2016   Myofascial pain dysfunction syndrome 08/22/2019   Non-alcoholic fatty liver disease 01/12/2018   OSA (obstructive sleep apnea) 02/24/2019   10/09/2018 - HST  - AHI 40.6    Osteopenia 12/06/2020   Rx alendronate  35 mg.   Pinguecula 12/06/2020   Presbyopia 12/06/2020   Pulmonary hypertension    RLS (restless legs syndrome) 04/27/2015   RSV (respiratory syncytial virus infection) 06/10/2023   Tremor, essential 12/11/2021   Type II diabetes mellitus (HCC) 09/24/2020   Past Surgical History:  Procedure Laterality Date   BREAST REDUCTION SURGERY     COLONOSCOPY  2018   6 mm cecal tubular adenoma, sigmoid diverticulosis, random colon biopsies consistent with microscopic colitis   COLONOSCOPY N/A 01/22/2024   Procedure: COLONOSCOPY;  Surgeon: Eartha Angelia Sieving, MD;  Location: [facility];  Service: Gastroenterology;  Laterality: N/A;   COLONOSCOPY N/A 02/23/2024   Procedure: COLONOSCOPY;  Surgeon: Eartha Angelia Sieving, MD;  Location: [facility];  Service: Gastroenterology;  Laterality: N/A;   ESOPHAGOGASTRODUODENOSCOPY N/A 01/22/2024   Procedure: EGD (ESOPHAGOGASTRODUODENOSCOPY);  Surgeon: Eartha Angelia, Sieving, MD;  Location: [facility];  Service: Gastroenterology;  Laterality: N/A;   ESOPHAGOGASTRODUODENOSCOPY N/A 02/23/2024   Procedure: EGD (ESOPHAGOGASTRODUODENOSCOPY);  Surgeon: Eartha Angelia, Sieving, MD;  Location: [facility];  Service: Gastroenterology;  Laterality: N/A;   EYE SURGERY Right  cateracts   HAMMER TOE SURGERY     LEFT HEART CATH AND CORONARY ANGIOGRAPHY N/A 02/03/2018   Procedure: LEFT HEART CATH AND CORONARY ANGIOGRAPHY;  Surgeon: Jordan, Peter M, MD;  Location: [facility];  Service: Cardiovascular;  Laterality: N/A;   REVERSE SHOULDER ARTHROPLASTY Right 08/19/2019    Procedure: REVERSE SHOULDER ARTHROPLASTY;  Surgeon: Kay Kemps, MD;  Location: [facility];  Service: Orthopedics;  Laterality: Right;  interscalene block   SHOULDER SURGERY Right    I BROKE MY SHOUDLER   THIGH SURGERY     TO REMOVE A TUMOR    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 02/04/2024   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   Osteopenia 02/04/2024   Pain of foot 02/04/2024   Pain of right breast 02/04/2024   Phase of life problem 02/04/2024   Acute gastric ulcer without hemorrhage or perforation 01/23/2024   Hematochezia 01/21/2024   Acute anemia 01/21/2024   ABLA (acute blood loss anemia) 01/19/2024   Essential hypertension 01/07/2024   Rectal bleeding 10/07/2023   Nocturnal enuresis 08/25/2023   Hyponatremia 06/10/2023   Abdominal pain 06/10/2023   Diarrhea 06/10/2023   Candidiasis of mouth 05/15/2023   Type 2 diabetes mellitus with hyperglycemia, with long-term current use of insulin  (HCC) 02/12/2023   Long term (current) use of oral hypoglycemic drugs 02/12/2023   Primary hypertension 01/14/2023   Endometrial cancer (HCC) 01/12/2023   Vulvar itching 12/22/2022   Burning with urination 11/13/2022   Vitamin B12 deficiency 11/09/2022   Frequent falls 09/01/2022   AKI (acute kidney injury) 06/27/2022   Hemorrhoids 03/11/2022   Microalbuminuria 01/21/2022   Tremor, essential 12/11/2021   Chronic bilateral low back pain with right-sided sciatica 07/19/2021   Vaginal itching 04/16/2021   Sensory ataxia 03/28/2021   Elevated troponin 12/14/2020   Chronic post-traumatic stress disorder (PTSD) 12/06/2020   Senile corneal changes 12/06/2020   Postmenopausal  bleeding 12/06/2020   Acquired hammer toe 12/06/2020   Opioid dependence 12/06/2020   Hypermetropia 12/06/2020   Generalized weakness 12/06/2020   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   Generalized anxiety disorder 12/06/2020   Functional diarrhea 10/26/2020   Insulin  dependent type 2 diabetes mellitus (HCC) 09/24/2020   Chronic respiratory failure with hypoxia (HCC) 04/25/2020   Chronic constipation 04/25/2020   Chronic pain syndrome 08/22/2019   CAD (coronary artery disease)    Hypocalcemia    S/P shoulder replacement, 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   Mixed hyperlipidemia 05/20/2019   Vertigo 04/25/2019   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   OSA (obstructive sleep apnea) 02/24/2019   At risk for adverse drug event 07/06/2018   Non-alcoholic fatty liver disease 01/12/2018   Mild intermittent asthma 01/12/2018   Senile osteoporosis 12/15/2017   Atrial fibrillation with RVR (HCC) 05/19/2017   Migraine headache with aura 02/12/2016   Diabetic neuropathy (HCC) 02/06/2016   Bipolar 1 disorder (HCC) 10/04/2015   Major depressive disorder 10/03/2015   Herpes genitalis in women 07/16/2015   Long term current use of anticoagulant 05/30/2015   Chronic heart failure with preserved ejection fraction (HFpEF) (HCC) 05/30/2015   RLS (restless legs syndrome) 04/27/2015   Insomnia 01/23/2015   Chronic atrial fibrillation with RVR (HCC) 01/04/2015  PCP: [name], MD  REFERRING PROVIDER: Margrette Taft BRAVO, MD   REFERRING DIAG: Gait disturbance, Contusion of right knee, initial encounter, Primary osteoarthritis of right knee   THERAPY DIAG:  No diagnosis found.  Rationale for Evaluation and Treatment: Rehabilitation  ONSET DATE: November 2024  SUBJECTIVE:   SUBJECTIVE STATEMENT: Patient arrived late for session and came without AD.  States she has 10/10 pain in her back, legs and feet today. Pt states she walked in [employer] for 3 hours yesterday pushing a grocery cart.      EVAL: Patient reports that her balance is her primary concern as her right knee has gotten better. She feels that her balance has been getting worse since November 2024. She has tried using a cane and a walker, but she does not feel steady or safe using an assistive device. She is primarily a furniture walker for balance. She has fallen 3-4 times in the past 6 months. She notes that she has had multiple colonoscopies over the previous months due to abdominal pain. She has been having a lot of pain over the past few days and she is scheduled for another colonoscopy in December.   PERTINENT HISTORY: Atrial fibrillation, CHF, diabetic neuropathy, osteoporosis, bipolar 1 disorder, asthma, PTSD, anxiety, and history of cancer PAIN:  Are you having pain? Yes: NPRS scale: 10/10 Pain location: back  Pain description: sharp Aggravating factors: walking Relieving factors: rest and elevation  PRECAUTIONS: Fall  RED FLAGS: None   WEIGHT BEARING RESTRICTIONS: No  FALLS:  Has patient fallen in last 6 months? Yes. Number of falls 3-4  LIVING ENVIRONMENT: Lives with: lives with their spouse Lives in: House/apartment Stairs: Yes: Internal: 1 flight steps; on right going up; patient avoids these steps due to difficulty and instability Has following equipment at home: Quad cane small base, Walker - 4 wheeled, shower chair, and Grab bars  OCCUPATION: retired  PLOF: Independent  PATIENT GOALS: be able to go shopping and improved safety  NEXT MD VISIT: none scheduled  OBJECTIVE:  Note: Objective measures were completed at Evaluation unless otherwise noted.  DIAGNOSTIC FINDINGS: 01/29/24 right knee x-ray IMPRESSION: Moderate prepatellar soft tissue swelling is noted suggesting traumatic injury. No fracture or dislocation is noted.  PATIENT SURVEYS:  ABC scale: The Activities-Specific Balance Confidence (ABC) Scale 0% 10 20 30  40 50 60 70 80 90 100% No confidence<->completely confident   "How confident are you that you will not lose your balance or become unsteady when you . . .   Date tested 03/28/24  Walk around the house 20%  2. Walk up or down stairs 10%  3. Bend over and pick up a slipper from in front of a closet floor 10%  4. Reach for a small can off a shelf at eye level 80%  5. Stand on tip toes and reach for something above your head 40%  6. Stand on a chair and reach for something 0%  7. Sweep the floor 0%  8. Walk outside the house to a car parked in the driveway 50%  9. Get into or out of a car 60%  10. Walk across a parking lot to the mall 0%  11. Walk up or down a ramp 40%  12. Walk in a crowded mall where people rapidly walk past you 10%  13. Are bumped into by people as you walk through the mall 0%  14. Step onto or off of an escalator while you are holding onto the railing 40%  15. Step onto or off an escalator while holding onto parcels such that you cannot hold onto the railing 0%  16. Walk outside on icy sidewalks 0%  Total: #/16 22.5%      COGNITION: Overall cognitive status: Within functional limits for tasks assessed     SENSATION: Light touch: Impaired  and increased sensitivity to light touch in bilateral lower 1/3 of the tibia Patient reports numbness or tingling in her feet and lower legs.   EDEMA:  No edema observed  POSTURE: rounded shoulders, forward head, and flexed trunk   LOWER EXTREMITY ROM: unable to assess secondary to pain   LOWER EXTREMITY MMT: high pain severity and irritability with MMT which limited full assessment   MMT Right eval Left eval  Hip flexion 4-/5 3+/5  Hip extension    Hip abduction    Hip adduction    Hip internal rotation    Hip external rotation    Knee flexion    Knee extension    Ankle dorsiflexion 4-/5 4-/5  Ankle plantarflexion    Ankle inversion    Ankle eversion     (Blank rows = not tested)  FUNCTIONAL TESTS: will be completed a following appointments, as able 5 times sit to  stand: 16 seconds on 04/04/24, BUE push up from arm rests Timed up and go (TUG): 21 seconds on 04/04/24, no AD 2 minute walk test: 194 ft on 04/04/24, no AD, pt SOB  GAIT: Assistive device utilized: None Level of assistance: SBA Comments: very unsteady with ambulation, required external support from the wall and other objects for safety                                                                                                                                TREATMENT DATE: 04/26/24 Ambulation with SPC, then with walker (pt is too unsafe without walker) Nustep 5 minutes level 3, UE/LE seat 7 Standing:  in parallel bars with bil UE assist heel raises 20X  Toe raises 20X  Forward lunges onto 4 box  Sit to stands without UE assist 10X  Gait with RW exiting clinic, safety with standing from seated position, how to use walker                                     04/22/24 EXERCISE LOG  Exercise Repetitions and Resistance Comments  Nustep  L1 x 9 minutes  1 lap; UE and LE   Gait training   With large based quad cane    Patient education  See below             Blank cell = exercise not performed today   04/06/24: Gait training QC 65ft, step to pattern, 3 point sequence Gait training QC 52ft, step thru pattern 3 point sequence  04/04/24: Review of goals Gait trial with QC, 70  ft, v cues for gait pattern  Pt SOB, SPO2 93%, HR 84 bpm 5 times sit to stand, see above TUG, see above 2 minute walk test, see above Gait trial with SPC, 70 ft, v reminders for pattern Seated heel/toe raises, 2x10 LAQ, 10x each LE Standing hip abduction at // bars, 10x each LE     03/28/24: PT evaluation and patient education  PATIENT EDUCATION:  Education details: POC, prognosis, pain, progress with physical therapy, and following up with her referring physician Person educated: Patient Education method: Explanation Education comprehension: verbalized understanding  HOME EXERCISE  PROGRAM: Access Code: [email or web address] URL: [email or web address] Date: 04/04/2024 Prepared by: Rosaria Powell-Butler  Exercises - Sit to Stand  - 2 x daily - 7 x weekly - 2 sets - 10 reps - Seated Heel Toe Raises  - 2 x daily - 7 x weekly - 2 sets - 10 reps - Seated Long Arc Quad  - 2 x daily - 7 x weekly - 2 sets - 10 reps - Standing Hip Abduction with Counter Support  - 2 x daily - 7 x weekly - 2 sets - 10 reps  ASSESSMENT:  CLINICAL IMPRESSION: Patient staggering when came back into clinic.  Therapist given SPC, which she was unsure of as feels she should use a 3 prong cane, however after gait training pt was able to complete with more stabilization and in correct sequencing. Following nustep, however pt was very unstable with SPC and had 2 episodes where she called out with pain, cramps in her Lt foot  and was reaching out to grab objects.  Instructed pt to begin using RW (and informed spouse) as she requires for safety and reducing the antalgia she is experiencing with ambulation.  Pt verbalized understanding.  Began sit to stands with extended time to complete due to weakness, unable to complete 10th repetition due to fatigue.  Cues to push up from chair rather than pulling up with walker or other objects.  Pt reported feeling better, looser at end of session.  Pt will continue to benefit from skilled therapy.     EVAL: Patient is a 78 y.o. female who was seen today for physical therapy evaluation and treatment for unsteadiness on her feet and history of falling. She is a high fall risk as evidenced by her gait mechanics and her history of falling. Her full objective assessment was unable to be completed due to her high pain severity and irritability. She requested to leave early secondary to her pain levels. Recommend that she continue with skilled physical therapy to address her impairments to maximize her safety and functional mobility.    OBJECTIVE IMPAIRMENTS: Abnormal gait,  decreased activity tolerance, decreased balance, decreased knowledge of use of DME, decreased mobility, difficulty walking, decreased strength, postural dysfunction, and pain.   ACTIVITY LIMITATIONS: carrying, bending, standing, stairs, transfers, and locomotion level  PARTICIPATION LIMITATIONS: meal prep, cleaning, laundry, shopping, and community activity  PERSONAL FACTORS: Past/current experiences, Time since onset of injury/illness/exacerbation, and 3+ comorbidities: Atrial fibrillation, CHF, diabetic neuropathy, osteoporosis, bipolar 1 disorder, asthma, PTSD, anxiety, and history of cancer are also affecting patient's functional outcome.   REHAB POTENTIAL: Fair    CLINICAL DECISION MAKING: Unstable/unpredictable  EVALUATION COMPLEXITY: High   GOALS: Goals reviewed with patient? Yes  LONG TERM GOALS: Target date: 04/25/24  Patient will be independent with her HEP.  Baseline:  Goal status: INITIAL  2.  Patient will improve her ABC by at least 10% for improved perceived function with  her daily activities.  Baseline:  Goal status: INITIAL  3.  Patient will be able to complete the timed up and go in 12 seconds or less to reduce her fall risk.  Baseline:  Goal status: INITIAL  4.  Patient will improve her timed up and go to 12 seconds or less to reduce her fall risk.  Baseline:  Goal status: INITIAL  5.  Patient will be able to demonstrate proper utilization of the least restrictive assistive device to improve her community mobility.  Baseline:  Goal status: INITIAL  PLAN:  PT FREQUENCY: 2x/week  PT DURATION: 4 weeks  PLANNED INTERVENTIONS: 97164- PT Re-evaluation, 97750- Physical Performance Testing, 97110-Therapeutic exercises, 97530- Therapeutic activity, 97112- Neuromuscular re-education, 97535- Self Care, [id number]- Manual therapy, [phone]- Gait training, Patient/Family education, Balance training, Stair training, Cryotherapy, and Moist heat  PLAN FOR NEXT SESSION: gait  training with LRAD, balance interventions, lower extremity strengthening  Greig KATHEE Fuse, PTA/CLT [facility] Ph: [phone] 3:25 PM, 04/26/24

## 2024-04-27 ENCOUNTER — Telehealth: Payer: Self-pay

## 2024-04-27 DIAGNOSIS — H532 Diplopia: Secondary | ICD-10-CM | POA: Diagnosis not present

## 2024-04-27 NOTE — Patient Instructions (Signed)
 Verneita JONETTA Gambler - I am sorry I was unable to reach you today for our scheduled appointment. I work with [name], MD and am calling to support your healthcare needs. Please contact me at [phone] at your earliest convenience. I look forward to speaking with you soon.   Thank you,  Hendricks Her RN, BSN  [place] [facility] RN Case Manager   Direct Dial -[phone]

## 2024-05-02 ENCOUNTER — Encounter (HOSPITAL_COMMUNITY): Payer: Self-pay

## 2024-05-02 ENCOUNTER — Ambulatory Visit (HOSPITAL_COMMUNITY)

## 2024-05-02 DIAGNOSIS — M6281 Muscle weakness (generalized): Secondary | ICD-10-CM | POA: Diagnosis present

## 2024-05-02 DIAGNOSIS — R2681 Unsteadiness on feet: Secondary | ICD-10-CM | POA: Diagnosis present

## 2024-05-02 NOTE — Therapy (Signed)
 OUTPATIENT PHYSICAL THERAPY LOWER EXTREMITY TREATMENT   Patient Name: Amber Stephenson MRN: [id number] DOB:1945/11/10, 78 y.o., female Today's Date: 05/02/2024  END OF SESSION:  PT End of Session - 05/02/24 1335     Visit Number 6    Number of Visits 8    Date for Recertification  05/27/24    Authorization Type Medicare part A and B, with [employer] for life secondary    Authorization Time Period no authorization required    Progress Note Due on Visit 10    PT Start Time 1335    PT Stop Time 1415    PT Time Calculation (min) 40 min    Equipment Utilized During Treatment Gait belt    Activity Tolerance Patient limited by fatigue;Patient limited by pain    Behavior During Therapy [facility] for tasks assessed/performed           Past Medical History:  Diagnosis Date   Allergic rhinitis 02/24/2019   Ambulatory dysfunction 04/23/2020   Atrial fibrillation with RVR (HCC) 05/19/2017   Back pain/sacroiliitis--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   Benign paroxysmal positional vertigo due to bilateral vestibular disorder 05/20/2019   Bipolar 1 disorder (HCC) 01/23/2015   with GAD, benzo dependence   CAD (coronary artery disease)    Nonobstructive; Managed by Dr. Charls   Cardiomegaly 01/12/2018   CHF (congestive heart failure) 06/08/2022   Chronic back pain 01/04/2015   Chronic constipation 04/25/2020   Chronic diastolic heart failure (HCC) 05/30/2015   Chronic pain syndrome 08/22/2019   back pain, sacroiliitis   Chronic post-traumatic stress disorder (PTSD) 12/06/2020   Diabetic neuropathy (HCC) 02/06/2016   Dyslipidemia 09/24/2020   Essential hypertension    Functional diarrhea 10/26/2020   Herpes genitalis in women 07/16/2015   History of adenomatous polyp of colon 05/21/2019   Overview:   03/31/17: Colonoscopy: nonadvanced adenoma, microscopic colitis, f/u 5 yrs, Murphy/GAP    History of radiation therapy    Endometrial- 02/18/23-03/31/23-Dr. Lynwood Kinard   Hypotension 09/01/2022   Insomnia 01/23/2015   Metabolic acidosis 01/12/2023   Migraine headache with aura 02/12/2016   Myofascial pain dysfunction syndrome 08/22/2019   Non-alcoholic fatty liver disease 01/12/2018   OSA (obstructive sleep apnea) 02/24/2019   10/09/2018 - HST  - AHI 40.6    Osteopenia 12/06/2020   Rx alendronate  35 mg.   Pinguecula 12/06/2020   Presbyopia 12/06/2020   Pulmonary hypertension    RLS (restless legs syndrome) 04/27/2015   RSV (respiratory syncytial virus infection) 06/10/2023   Tremor, essential 12/11/2021   Type II diabetes mellitus (HCC) 09/24/2020   Past Surgical History:  Procedure Laterality Date   BREAST REDUCTION SURGERY     COLONOSCOPY  2018   6 mm cecal tubular adenoma, sigmoid diverticulosis, random colon biopsies consistent with microscopic colitis   COLONOSCOPY N/A 01/22/2024   Procedure: COLONOSCOPY;  Surgeon: Eartha Angelia Sieving, MD;  Location: [facility];  Service: Gastroenterology;  Laterality: N/A;   COLONOSCOPY N/A 02/23/2024   Procedure: COLONOSCOPY;  Surgeon: Eartha Angelia Sieving, MD;  Location: [facility];  Service: Gastroenterology;  Laterality: N/A;   ESOPHAGOGASTRODUODENOSCOPY N/A 01/22/2024   Procedure: EGD (ESOPHAGOGASTRODUODENOSCOPY);  Surgeon: Eartha Angelia, Sieving, MD;  Location: [facility];  Service: Gastroenterology;  Laterality: N/A;   ESOPHAGOGASTRODUODENOSCOPY N/A 02/23/2024   Procedure: EGD (ESOPHAGOGASTRODUODENOSCOPY);  Surgeon: Eartha Angelia, Sieving, MD;  Location: [facility];  Service: Gastroenterology;  Laterality:  N/A;   EYE SURGERY Right    cateracts   HAMMER TOE SURGERY     LEFT HEART CATH AND CORONARY ANGIOGRAPHY N/A 02/03/2018   Procedure: LEFT HEART CATH AND CORONARY ANGIOGRAPHY;  Surgeon: Jordan, Peter M, MD;  Location: [facility] INVASIVE CV LAB;  Service: Cardiovascular;  Laterality: N/A;   REVERSE  SHOULDER ARTHROPLASTY Right 08/19/2019   Procedure: REVERSE SHOULDER ARTHROPLASTY;  Surgeon: Kay Kemps, MD;  Location: [facility];  Service: Orthopedics;  Laterality: Right;  interscalene block   SHOULDER SURGERY Right    I BROKE MY SHOUDLER   THIGH SURGERY     TO REMOVE A TUMOR    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 02/04/2024   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   Osteopenia 02/04/2024   Pain of foot 02/04/2024   Pain of right breast 02/04/2024   Phase of life problem 02/04/2024   Acute gastric ulcer without hemorrhage or perforation 01/23/2024   Hematochezia 01/21/2024   Acute anemia 01/21/2024   ABLA (acute blood loss anemia) 01/19/2024   Essential hypertension 01/07/2024   Rectal bleeding 10/07/2023   Nocturnal enuresis 08/25/2023   Hyponatremia 06/10/2023   Abdominal pain 06/10/2023   Diarrhea 06/10/2023   Candidiasis of mouth 05/15/2023   Type 2 diabetes mellitus with hyperglycemia, with long-term current use of insulin  (HCC) 02/12/2023   Long term (current) use of oral hypoglycemic drugs 02/12/2023   Primary hypertension 01/14/2023   Endometrial cancer (HCC) 01/12/2023   Vulvar itching 12/22/2022   Burning with urination 11/13/2022   Vitamin B12 deficiency 11/09/2022   Frequent falls 09/01/2022   AKI (acute kidney injury) 06/27/2022   Hemorrhoids 03/11/2022   Microalbuminuria 01/21/2022   Tremor, essential 12/11/2021   Chronic bilateral low back pain with right-sided sciatica 07/19/2021   Vaginal itching 04/16/2021   Sensory ataxia 03/28/2021   Elevated troponin 12/14/2020   Chronic post-traumatic stress disorder (PTSD) 12/06/2020   Senile corneal  changes 12/06/2020   Postmenopausal bleeding 12/06/2020   Acquired hammer toe 12/06/2020   Opioid dependence 12/06/2020   Hypermetropia 12/06/2020   Generalized weakness 12/06/2020   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   Generalized anxiety disorder 12/06/2020   Functional diarrhea 10/26/2020   Insulin  dependent type 2 diabetes mellitus (HCC) 09/24/2020   Chronic respiratory failure with hypoxia (HCC) 04/25/2020   Chronic constipation 04/25/2020   Chronic pain syndrome 08/22/2019   CAD (coronary artery disease)    Hypocalcemia    S/P shoulder replacement, 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   Mixed hyperlipidemia 05/20/2019   Vertigo 04/25/2019   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   OSA (obstructive sleep apnea) 02/24/2019   At risk for adverse drug event 07/06/2018   Non-alcoholic fatty liver disease 01/12/2018   Mild intermittent asthma 01/12/2018   Senile osteoporosis 12/15/2017   Atrial fibrillation with RVR (HCC) 05/19/2017   Migraine headache with aura 02/12/2016   Diabetic neuropathy (HCC) 02/06/2016   Bipolar 1 disorder (HCC) 10/04/2015   Major depressive disorder 10/03/2015   Herpes genitalis in women 07/16/2015   Long term current use of anticoagulant 05/30/2015   Chronic heart failure with preserved ejection fraction (HFpEF) (HCC) 05/30/2015   RLS (restless legs syndrome) 04/27/2015   Insomnia 01/23/2015   Chronic  atrial fibrillation with RVR (HCC) 01/04/2015    PCP: [name], MD  REFERRING PROVIDER: Margrette Taft BRAVO, MD   REFERRING DIAG: Gait disturbance, Contusion of right knee, initial encounter, Primary osteoarthritis of right knee   THERAPY DIAG:  Unsteadiness on feet  Muscle weakness (generalized)  Rationale for Evaluation and Treatment: Rehabilitation  ONSET DATE: November 2024  SUBJECTIVE:   SUBJECTIVE STATEMENT: Patient reports she fell this morning in the bathroom. Tripped.  Wasn't using an AD. Did not hit her head. Fell onto her knees.  R knee hurts most now, 7/10. Later reports another fall yesterday. Reports she hit on her L side. Reports some bruising around L breast. No difficulty or pain with breathing however.    EVAL: Patient reports that her balance is her primary concern as her right knee has gotten better. She feels that her balance has been getting worse since November 2024. She has tried using a cane and a walker, but she does not feel steady or safe using an assistive device. She is primarily a furniture walker for balance. She has fallen 3-4 times in the past 6 months. She notes that she has had multiple colonoscopies over the previous months due to abdominal pain. She has been having a lot of pain over the past few days and she is scheduled for another colonoscopy in December.   PERTINENT HISTORY: Atrial fibrillation, CHF, diabetic neuropathy, osteoporosis, bipolar 1 disorder, asthma, PTSD, anxiety, and history of cancer PAIN:  Are you having pain? Yes: NPRS scale: 10/10 Pain location: back  Pain description: sharp Aggravating factors: walking Relieving factors: rest and elevation  PRECAUTIONS: Fall  RED FLAGS: None   WEIGHT BEARING RESTRICTIONS: No  FALLS:  Has patient fallen in last 6 months? Yes. Number of falls 3-4  LIVING ENVIRONMENT: Lives with: lives with their spouse Lives in: House/apartment Stairs: Yes: Internal: 1 flight steps; on right going up; patient avoids these steps due to difficulty and instability Has following equipment at home: Quad cane small base, Walker - 4 wheeled, shower chair, and Grab bars  OCCUPATION: retired  PLOF: Independent  PATIENT GOALS: be able to go shopping and improved safety  NEXT MD VISIT: none scheduled  OBJECTIVE:  Note: Objective measures were completed at Evaluation unless otherwise noted.  DIAGNOSTIC FINDINGS: 01/29/24 right knee x-ray IMPRESSION: Moderate prepatellar soft tissue swelling is noted suggesting traumatic injury. No fracture or  dislocation is noted.  PATIENT SURVEYS:  ABC scale: The Activities-Specific Balance Confidence (ABC) Scale 0% 10 20 30  40 50 60 70 80 90 100% No confidence<->completely confident  "How confident are you that you will not lose your balance or become unsteady when you . . .   Date tested 03/28/24  Walk around the house 20%  2. Walk up or down stairs 10%  3. Bend over and pick up a slipper from in front of a closet floor 10%  4. Reach for a small can off a shelf at eye level 80%  5. Stand on tip toes and reach for something above your head 40%  6. Stand on a chair and reach for something 0%  7. Sweep the floor 0%  8. Walk outside the house to a car parked in the driveway 50%  9. Get into or out of a car 60%  10. Walk across a parking lot to the mall 0%  11. Walk up or down a ramp 40%  12. Walk in a crowded mall where people rapidly walk past you 10%  13. Are bumped into by people as you walk through the mall 0%  14. Step onto or off of an escalator while you are holding onto the railing 40%  15. Step onto or off an escalator while holding onto parcels such that you cannot hold onto the railing 0%  16. Walk outside on icy sidewalks 0%  Total: #/16 22.5%      COGNITION: Overall cognitive status: Within functional limits for tasks assessed     SENSATION: Light touch: Impaired  and increased sensitivity to light touch in bilateral lower 1/3 of the tibia Patient reports numbness or tingling in her feet and lower legs.   EDEMA:  No edema observed  POSTURE: rounded shoulders, forward head, and flexed trunk   LOWER EXTREMITY ROM: unable to assess secondary to pain   LOWER EXTREMITY MMT: high pain severity and irritability with MMT which limited full assessment   MMT Right eval Left eval  Hip flexion 4-/5 3+/5  Hip extension    Hip abduction    Hip adduction    Hip internal rotation    Hip external rotation    Knee flexion    Knee extension    Ankle dorsiflexion 4-/5 4-/5   Ankle plantarflexion    Ankle inversion    Ankle eversion     (Blank rows = not tested)  FUNCTIONAL TESTS: will be completed a following appointments, as able 5 times sit to stand: 16 seconds on 04/04/24, BUE push up from arm rests Timed up and go (TUG): 21 seconds on 04/04/24, no AD 2 minute walk test: 194 ft on 04/04/24, no AD, pt SOB  GAIT: Assistive device utilized: None Level of assistance: SBA Comments: very unsteady with ambulation, required external support from the wall and other objects for safety                                                                                                                                TREATMENT DATE: 05/02/24: STS, no UE use, 10x STS + shoulder flexion to shoulder height, 2 lb bar, 10x STS + UE lift + march, 10x Side stepping, 20 ftx2, CGA  +RTB, around knees, 20 ftx2, CGA Backwards ambulation, RTB, around knees, 20 ftx2 Ambulation with QC, 66 ft --> 33 ft, CGA/min A and verbal cues    04/26/24 Ambulation with SPC, then with walker (pt is too unsafe without walker) Nustep 5 minutes level 3, UE/LE seat 7 Standing:  in parallel bars with bil UE assist heel raises 20X  Toe raises 20X  Forward lunges onto 4 box  Sit to stands without UE assist 10X  Gait with RW exiting clinic, safety with standing from seated position, how to use walker                                     04/22/24 EXERCISE LOG  Exercise Repetitions and Resistance Comments  Nustep  L1 x 9 minutes  1 lap; UE and LE   Gait training   With large based quad cane    Patient education  See below             Blank cell = exercise not performed today   04/06/24: Gait training QC 45ft, step to pattern, 3 point sequence Gait training QC 49ft, step thru pattern 3 point sequence    PATIENT EDUCATION:  Education details: POC, prognosis, pain, progress with physical therapy, and following up with her referring physician Person educated: Patient Education method:  Explanation Education comprehension: verbalized understanding  HOME EXERCISE PROGRAM: Access Code: [email or web address] URL: [email or web address] Date: 04/04/2024 Prepared by: Rosaria Powell-Butler  Exercises - Sit to Stand  - 2 x daily - 7 x weekly - 2 sets - 10 reps - Seated Heel Toe Raises  - 2 x daily - 7 x weekly - 2 sets - 10 reps - Seated Long Arc Quad  - 2 x daily - 7 x weekly - 2 sets - 10 reps - Standing Hip Abduction with Counter Support  - 2 x daily - 7 x weekly - 2 sets - 10 reps  ASSESSMENT:  CLINICAL IMPRESSION: Patient arrives to session without AD and using wall at times for support. Reports she's had some recent falls, resulting in bilateral knee pain and L sided/breast pain. Some tenderness to palpation on medial inferior aspect of L knee and pt reporting bruising around L breast area. Pt reports no difficulty or pain with breathing and did not hit her head. Highly encouraged to monitor pain and consider ER visit should breathing start hurt and/or pain not improve by next day. Session included LE strengthening in combination with balance challenges. Pt requiring CGA/min A throughout, due to imbalance during session. Ended with trial of ambulating with QC, as pt reports having difficulty with ambulating in home with RW due to limited space. Requires verbal cueing for 2 point gait pattern, improves with duration of task. Pt will continue to benefit from skilled therapy to address deficits and improve fall risk.         EVAL: Patient is a 78 y.o. female who was seen today for physical therapy evaluation and treatment for unsteadiness on her feet and history of falling. She is a high fall risk as evidenced by her gait mechanics and her history of falling. Her full objective assessment was unable to be completed due to her high pain severity and irritability. She requested to leave early secondary to her pain levels. Recommend that she continue with skilled physical therapy to  address her impairments to maximize her safety and functional mobility.    OBJECTIVE IMPAIRMENTS: Abnormal gait, decreased activity tolerance, decreased balance, decreased knowledge of use of DME, decreased mobility, difficulty walking, decreased strength, postural dysfunction, and pain.   ACTIVITY LIMITATIONS: carrying, bending, standing, stairs, transfers, and locomotion level  PARTICIPATION LIMITATIONS: meal prep, cleaning, laundry, shopping, and community activity  PERSONAL FACTORS: Past/current experiences, Time since onset of injury/illness/exacerbation, and 3+ comorbidities: Atrial fibrillation, CHF, diabetic neuropathy, osteoporosis, bipolar 1 disorder, asthma, PTSD, anxiety, and history of cancer are also affecting patient's functional outcome.   REHAB POTENTIAL: Fair    CLINICAL DECISION MAKING: Unstable/unpredictable  EVALUATION COMPLEXITY: High   GOALS: Goals reviewed with patient? Yes  LONG TERM GOALS: Target date: 04/25/24  Patient will be independent with her HEP.  Baseline:  Goal status: INITIAL  2.  Patient will improve  her ABC by at least 10% for improved perceived function with her daily activities.  Baseline:  Goal status: INITIAL  3.  Patient will be able to complete the timed up and go in 12 seconds or less to reduce her fall risk.  Baseline:  Goal status: INITIAL  4.  Patient will improve her timed up and go to 12 seconds or less to reduce her fall risk.  Baseline:  Goal status: INITIAL  5.  Patient will be able to demonstrate proper utilization of the least restrictive assistive device to improve her community mobility.  Baseline:  Goal status: INITIAL  PLAN:  PT FREQUENCY: 2x/week  PT DURATION: 4 weeks  PLANNED INTERVENTIONS: [id number]- PT Re-evaluation, 97750- Physical Performance Testing, 97110-Therapeutic exercises, 97530- Therapeutic activity, [id number]- Neuromuscular re-education, 97535- Self Care, [id number]- Manual therapy, [phone]- Gait training,  Patient/Family education, Balance training, Stair training, Cryotherapy, and Moist heat  PLAN FOR NEXT SESSION: gait training with LRAD, balance interventions, lower extremity strengthening  2:27 PM, 05/02/24 Terrisha Lopata Powell-Butler, PT, DPT [facility] [place]

## 2024-05-02 NOTE — Telephone Encounter (Signed)
 Pt called in to cancel for 12/10. Wanted to wait until after the holidays and schedule in Jan/Feb. Advised will call once we get that schedule.

## 2024-05-04 ENCOUNTER — Ambulatory Visit (HOSPITAL_COMMUNITY): Admitting: Physical Therapy

## 2024-05-04 DIAGNOSIS — R2681 Unsteadiness on feet: Secondary | ICD-10-CM

## 2024-05-04 DIAGNOSIS — M6281 Muscle weakness (generalized): Secondary | ICD-10-CM

## 2024-05-04 NOTE — Therapy (Signed)
 OUTPATIENT PHYSICAL THERAPY LOWER EXTREMITY TREATMENT   Patient Name: Amber Stephenson MRN: [id number] DOB:10-20-1945, 78 y.o., female Today's Date: 05/04/2024  END OF SESSION:  PT End of Session - 05/04/24 1456     Visit Number 7    Number of Visits 8    Date for Recertification  05/27/24    Authorization Type Medicare part A and B, with [employer] for life secondary    Authorization Time Period no authorization required    Progress Note Due on Visit 10    PT Start Time 1418    PT Stop Time 1500    PT Time Calculation (min) 42 min    Equipment Utilized During Treatment Gait belt    Activity Tolerance Patient limited by fatigue;Patient limited by pain    Behavior During Therapy [facility] for tasks assessed/performed            Past Medical History:  Diagnosis Date   Allergic rhinitis 02/24/2019   Ambulatory dysfunction 04/23/2020   Atrial fibrillation with RVR (HCC) 05/19/2017   Back pain/sacroiliitis--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   Benign paroxysmal positional vertigo due to bilateral vestibular disorder 05/20/2019   Bipolar 1 disorder (HCC) 01/23/2015   with GAD, benzo dependence   CAD (coronary artery disease)    Nonobstructive; Managed by Dr. Charls   Cardiomegaly 01/12/2018   CHF (congestive heart failure) 06/08/2022   Chronic back pain 01/04/2015   Chronic constipation 04/25/2020   Chronic diastolic heart failure (HCC) 05/30/2015   Chronic pain syndrome 08/22/2019   back pain, sacroiliitis   Chronic post-traumatic stress disorder (PTSD) 12/06/2020   Diabetic neuropathy (HCC) 02/06/2016   Dyslipidemia 09/24/2020   Essential hypertension    Functional diarrhea 10/26/2020   Herpes genitalis in women 07/16/2015   History of adenomatous polyp of colon 05/21/2019   Overview:   03/31/17: Colonoscopy: nonadvanced adenoma, microscopic colitis, f/u 5 yrs, Murphy/GAP    History of radiation therapy    Endometrial- 02/18/23-03/31/23-Dr. Lynwood Kinard   Hypotension 09/01/2022   Insomnia 01/23/2015   Metabolic acidosis 01/12/2023   Migraine headache with aura 02/12/2016   Myofascial pain dysfunction syndrome 08/22/2019   Non-alcoholic fatty liver disease 01/12/2018   OSA (obstructive sleep apnea) 02/24/2019   10/09/2018 - HST  - AHI 40.6    Osteopenia 12/06/2020   Rx alendronate  35 mg.   Pinguecula 12/06/2020   Presbyopia 12/06/2020   Pulmonary hypertension    RLS (restless legs syndrome) 04/27/2015   RSV (respiratory syncytial virus infection) 06/10/2023   Tremor, essential 12/11/2021   Type II diabetes mellitus (HCC) 09/24/2020   Past Surgical History:  Procedure Laterality Date   BREAST REDUCTION SURGERY     COLONOSCOPY  2018   6 mm cecal tubular adenoma, sigmoid diverticulosis, random colon biopsies consistent with microscopic colitis   COLONOSCOPY N/A 01/22/2024   Procedure: COLONOSCOPY;  Surgeon: Eartha Angelia Sieving, MD;  Location: [facility];  Service: Gastroenterology;  Laterality: N/A;   COLONOSCOPY N/A 02/23/2024   Procedure: COLONOSCOPY;  Surgeon: Eartha Angelia Sieving, MD;  Location: [facility];  Service: Gastroenterology;  Laterality: N/A;   ESOPHAGOGASTRODUODENOSCOPY N/A 01/22/2024   Procedure: EGD (ESOPHAGOGASTRODUODENOSCOPY);  Surgeon: Eartha Angelia, Sieving, MD;  Location: [facility];  Service: Gastroenterology;  Laterality: N/A;   ESOPHAGOGASTRODUODENOSCOPY N/A 02/23/2024   Procedure: EGD (ESOPHAGOGASTRODUODENOSCOPY);  Surgeon: Eartha Angelia, Sieving, MD;  Location: [facility];  Service: Gastroenterology;  Laterality: N/A;   EYE SURGERY Right    cateracts   HAMMER TOE SURGERY     LEFT HEART CATH AND CORONARY ANGIOGRAPHY N/A 02/03/2018   Procedure: LEFT HEART CATH AND CORONARY ANGIOGRAPHY;  Surgeon: Jordan, Peter M, MD;  Location: [facility];  Service: Cardiovascular;  Laterality: N/A;   REVERSE  SHOULDER ARTHROPLASTY Right 08/19/2019   Procedure: REVERSE SHOULDER ARTHROPLASTY;  Surgeon: Kay Kemps, MD;  Location: [facility];  Service: Orthopedics;  Laterality: Right;  interscalene block   SHOULDER SURGERY Right    I BROKE MY SHOUDLER   THIGH SURGERY     TO REMOVE A TUMOR    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 02/04/2024   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   Osteopenia 02/04/2024   Pain of foot 02/04/2024   Pain of right breast 02/04/2024   Phase of life problem 02/04/2024   Acute gastric ulcer without hemorrhage or perforation 01/23/2024   Hematochezia 01/21/2024   Acute anemia 01/21/2024   ABLA (acute blood loss anemia) 01/19/2024   Essential hypertension 01/07/2024   Rectal bleeding 10/07/2023   Nocturnal enuresis 08/25/2023   Hyponatremia 06/10/2023   Abdominal pain 06/10/2023   Diarrhea 06/10/2023   Candidiasis of mouth 05/15/2023   Type 2 diabetes mellitus with hyperglycemia, with long-term current use of insulin  (HCC) 02/12/2023   Long term (current) use of oral hypoglycemic drugs 02/12/2023   Primary hypertension 01/14/2023   Endometrial cancer (HCC) 01/12/2023   Vulvar itching 12/22/2022   Burning with urination 11/13/2022   Vitamin B12 deficiency 11/09/2022   Frequent falls 09/01/2022   AKI (acute kidney injury) 06/27/2022   Hemorrhoids 03/11/2022   Microalbuminuria 01/21/2022   Tremor, essential 12/11/2021   Chronic bilateral low back pain with right-sided sciatica 07/19/2021   Vaginal itching 04/16/2021   Sensory ataxia 03/28/2021   Elevated troponin 12/14/2020   Chronic post-traumatic stress disorder (PTSD) 12/06/2020   Senile corneal  changes 12/06/2020   Postmenopausal bleeding 12/06/2020   Acquired hammer toe 12/06/2020   Opioid dependence 12/06/2020   Hypermetropia 12/06/2020   Generalized weakness 12/06/2020   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   Generalized anxiety disorder 12/06/2020   Functional diarrhea 10/26/2020   Insulin  dependent type 2 diabetes mellitus (HCC) 09/24/2020   Chronic respiratory failure with hypoxia (HCC) 04/25/2020   Chronic constipation 04/25/2020   Chronic pain syndrome 08/22/2019   CAD (coronary artery disease)    Hypocalcemia    S/P shoulder replacement, 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   Mixed hyperlipidemia 05/20/2019   Vertigo 04/25/2019   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   OSA (obstructive sleep apnea) 02/24/2019   At risk for adverse drug event 07/06/2018   Non-alcoholic fatty liver disease 01/12/2018   Mild intermittent asthma 01/12/2018   Senile osteoporosis 12/15/2017   Atrial fibrillation with RVR (HCC) 05/19/2017   Migraine headache with aura 02/12/2016   Diabetic neuropathy (HCC) 02/06/2016   Bipolar 1 disorder (HCC) 10/04/2015   Major depressive disorder 10/03/2015   Herpes genitalis in women 07/16/2015   Long term current use of anticoagulant 05/30/2015   Chronic heart failure with preserved ejection fraction (HFpEF) (HCC) 05/30/2015   RLS (restless legs syndrome) 04/27/2015   Insomnia 01/23/2015  Chronic atrial fibrillation with RVR (HCC) 01/04/2015    PCP: [name], MD  REFERRING PROVIDER: Margrette Taft BRAVO, MD   REFERRING DIAG: Gait disturbance, Contusion of right knee, initial encounter, Primary osteoarthritis of right knee   THERAPY DIAG:  Unsteadiness on feet  Muscle weakness (generalized)  Rationale for Evaluation and Treatment: Rehabilitation  ONSET DATE: November 2024  SUBJECTIVE:   SUBJECTIVE STATEMENT: Patient reports she has fallen several times since her last visit.  Comes today without AD, admits to not being compliant with  HEP.   EVAL: Patient reports that her balance is her primary concern as her right knee has gotten better. She feels that her balance has been getting worse since November 2024. She has tried using a cane and a walker, but she does not feel steady or safe using an assistive device. She is primarily a furniture walker for balance. She has fallen 3-4 times in the past 6 months. She notes that she has had multiple colonoscopies over the previous months due to abdominal pain. She has been having a lot of pain over the past few days and she is scheduled for another colonoscopy in December.   PERTINENT HISTORY: Atrial fibrillation, CHF, diabetic neuropathy, osteoporosis, bipolar 1 disorder, asthma, PTSD, anxiety, and history of cancer PAIN:  Are you having pain? Yes: NPRS scale: 10/10 Pain location: back  Pain description: sharp Aggravating factors: walking Relieving factors: rest and elevation  PRECAUTIONS: Fall  RED FLAGS: None   WEIGHT BEARING RESTRICTIONS: No  FALLS:  Has patient fallen in last 6 months? Yes. Number of falls 3-4  LIVING ENVIRONMENT: Lives with: lives with their spouse Lives in: House/apartment Stairs: Yes: Internal: 1 flight steps; on right going up; patient avoids these steps due to difficulty and instability Has following equipment at home: Quad cane small base, Walker - 4 wheeled, shower chair, and Grab bars  OCCUPATION: retired  PLOF: Independent  PATIENT GOALS: be able to go shopping and improved safety  NEXT MD VISIT: none scheduled  OBJECTIVE:  Note: Objective measures were completed at Evaluation unless otherwise noted.  DIAGNOSTIC FINDINGS: 01/29/24 right knee x-ray IMPRESSION: Moderate prepatellar soft tissue swelling is noted suggesting traumatic injury. No fracture or dislocation is noted.  PATIENT SURVEYS:  ABC scale: The Activities-Specific Balance Confidence (ABC) Scale 0% 10 20 30  40 50 60 70 80 90 100% No confidence<->completely confident   "How confident are you that you will not lose your balance or become unsteady when you . . .   Date tested 03/28/24  Walk around the house 20%  2. Walk up or down stairs 10%  3. Bend over and pick up a slipper from in front of a closet floor 10%  4. Reach for a small can off a shelf at eye level 80%  5. Stand on tip toes and reach for something above your head 40%  6. Stand on a chair and reach for something 0%  7. Sweep the floor 0%  8. Walk outside the house to a car parked in the driveway 50%  9. Get into or out of a car 60%  10. Walk across a parking lot to the mall 0%  11. Walk up or down a ramp 40%  12. Walk in a crowded mall where people rapidly walk past you 10%  13. Are bumped into by people as you walk through the mall 0%  14. Step onto or off of an escalator while you are holding onto the railing 40%  15. Step onto or off an escalator while holding onto parcels such that you cannot hold onto the railing 0%  16. Walk outside on icy sidewalks 0%  Total: #/16 22.5%      COGNITION: Overall cognitive status: Within functional limits for tasks assessed     SENSATION: Light touch: Impaired  and increased sensitivity to light touch in bilateral lower 1/3 of the tibia Patient reports numbness or tingling in her feet and lower legs.   EDEMA:  No edema observed  POSTURE: rounded shoulders, forward head, and flexed trunk   LOWER EXTREMITY ROM: unable to assess secondary to pain   LOWER EXTREMITY MMT: high pain severity and irritability with MMT which limited full assessment   MMT Right eval Left eval  Hip flexion 4-/5 3+/5  Hip extension    Hip abduction    Hip adduction    Hip internal rotation    Hip external rotation    Knee flexion    Knee extension    Ankle dorsiflexion 4-/5 4-/5  Ankle plantarflexion    Ankle inversion    Ankle eversion     (Blank rows = not tested)  FUNCTIONAL TESTS: will be completed a following appointments, as able 5 times sit to  stand: 16 seconds on 04/04/24, BUE push up from arm rests Timed up and go (TUG): 21 seconds on 04/04/24, no AD 2 minute walk test: 194 ft on 04/04/24, no AD, pt SOB  GAIT: Assistive device utilized: None Level of assistance: SBA Comments: very unsteady with ambulation, required external support from the wall and other objects for safety                                                                                                                                TREATMENT DATE: 05/04/24 Ambulation with SPC and CGA-minA 200 feet Seated:  LAQ 10X5 (diff with seated balance)  Sit to stands no UE 10X Supine:  bridge 10X  SLR 2 sets 5 reps (AAROM to complete in full ROM and form)  LTR 10X due to discomfort/stiffness Sidelying hip abduction 10X each side with AAROM for form and ROM  05/02/24: STS, no UE use, 10x STS + shoulder flexion to shoulder height, 2 lb bar, 10x STS + UE lift + march, 10x Side stepping, 20 ftx2, CGA  +RTB, around knees, 20 ftx2, CGA Backwards ambulation, RTB, around knees, 20 ftx2 Ambulation with QC, 66 ft --> 33 ft, CGA/min A and verbal cues    04/26/24 Ambulation with SPC, then with walker (pt is too unsafe without walker) Nustep 5 minutes level 3, UE/LE seat 7 Standing:  in parallel bars with bil UE assist heel raises 20X  Toe raises 20X  Forward lunges onto 4 box  Sit to stands without UE assist 10X  Gait with RW exiting clinic, safety with standing from seated position, how to use walker  04/22/24 EXERCISE LOG  Exercise Repetitions and Resistance Comments  Nustep  L1 x 9 minutes  1 lap; UE and LE   Gait training   With large based quad cane    Patient education  See below             Blank cell = exercise not performed today   04/06/24: Gait training QC 53ft, step to pattern, 3 point sequence Gait training QC 84ft, step thru pattern 3 point sequence    PATIENT EDUCATION:  Education details: POC, prognosis,  pain, progress with physical therapy, and following up with her referring physician Person educated: Patient Education method: Explanation Education comprehension: verbalized understanding  HOME EXERCISE PROGRAM: Access Code: [email or web address] URL: [email or web address] Date: 04/04/2024 Prepared by: Rosaria Powell-Butler  Exercises - Sit to Stand  - 2 x daily - 7 x weekly - 2 sets - 10 reps - Seated Heel Toe Raises  - 2 x daily - 7 x weekly - 2 sets - 10 reps - Seated Long Arc Quad  - 2 x daily - 7 x weekly - 2 sets - 10 reps - Standing Hip Abduction with Counter Support  - 2 x daily - 7 x weekly - 2 sets - 10 reps  Access Code: [email or web address] URL: [email or web address] Date: 05/04/2024 - Supine Bridge  - 2 x daily - 7 x weekly - 10 reps - Small Range Straight Leg Raise  - 2 x daily - 7 x weekly - 10 reps - Sidelying Hip Abduction  - 2 x daily - 7 x weekly - 10 reps  ASSESSMENT:  CLINICAL IMPRESSION: Patient returns today without AD, rose from chair, grabbing for support to make it to clinic door, several stumbles and recoveries.  When asked where her walker is to her and spouse reported she was unaware of needing to use it.  Pt then reported she had fallen 3 times since last appt.  Reminded pt therapist informed she and her spouse at last visit that she needs a walker at all times.  Pt verbalized understanding.  Therapist asked about HEP in which she stated she has not been doing this.  Again, educated pt that coming here 1X week is not enough to improve her functionally and she must be doing the HEP.  Pt admits to sleeping most of the day.   Therapist used gait belt providing CG-min assist with walking warmup using SPC.  Max cues to keep gaze ahead instead of looking down.  Began with mat strengthening as with noted weakness, early fatigue and noted mm tremors when completing therex. Pt also drifted off several times during session today. Difficulty maintaining upright seated  posture without UE assistance.  Verbally reminded pt and spouse at end of session to complete HEP 2X daily and use walker at all times as she is high fall risk.   Updated HEP to include added exercises today.  Pt also with challenge completing mat transfer via log roll technique with pain reported in lower back.  Pt will continue to benefit from skilled therapy to address deficits and improve fall risk.     EVAL: Patient is a 78 y.o. female who was seen today for physical therapy evaluation and treatment for unsteadiness on her feet and history of falling. She is a high fall risk as evidenced by her gait mechanics and her history of falling. Her full objective assessment was unable to be completed due to her high pain severity and irritability. She requested to leave early  secondary to her pain levels. Recommend that she continue with skilled physical therapy to address her impairments to maximize her safety and functional mobility.    OBJECTIVE IMPAIRMENTS: Abnormal gait, decreased activity tolerance, decreased balance, decreased knowledge of use of DME, decreased mobility, difficulty walking, decreased strength, postural dysfunction, and pain.   ACTIVITY LIMITATIONS: carrying, bending, standing, stairs, transfers, and locomotion level  PARTICIPATION LIMITATIONS: meal prep, cleaning, laundry, shopping, and community activity  PERSONAL FACTORS: Past/current experiences, Time since onset of injury/illness/exacerbation, and 3+ comorbidities: Atrial fibrillation, CHF, diabetic neuropathy, osteoporosis, bipolar 1 disorder, asthma, PTSD, anxiety, and history of cancer are also affecting patient's functional outcome.   REHAB POTENTIAL: Fair    CLINICAL DECISION MAKING: Unstable/unpredictable  EVALUATION COMPLEXITY: High   GOALS: Goals reviewed with patient? Yes  LONG TERM GOALS: Target date: 04/25/24  Patient will be independent with her HEP.  Baseline:  Goal status: INITIAL  2.  Patient will  improve her ABC by at least 10% for improved perceived function with her daily activities.  Baseline:  Goal status: INITIAL  3.  Patient will be able to complete the timed up and go in 12 seconds or less to reduce her fall risk.  Baseline:  Goal status: INITIAL  4.  Patient will improve her timed up and go to 12 seconds or less to reduce her fall risk.  Baseline:  Goal status: INITIAL  5.  Patient will be able to demonstrate proper utilization of the least restrictive assistive device to improve her community mobility.  Baseline:  Goal status: INITIAL  PLAN:  PT FREQUENCY: 2x/week  PT DURATION: 4 weeks  PLANNED INTERVENTIONS: 97164- PT Re-evaluation, 97750- Physical Performance Testing, 97110-Therapeutic exercises, 97530- Therapeutic activity, [id number]- Neuromuscular re-education, 97535- Self Care, [id number]- Manual therapy, [phone]- Gait training, Patient/Family education, Balance training, Stair training, Cryotherapy, and Moist heat  PLAN FOR NEXT SESSION: gait training with LRAD, balance interventions, lower extremity strengthening. Follow up if using RW, completing HEP and if she's had any falls.   3:27 PM, 05/04/24 Greig KATHEE Fuse, PTA/CLT [facility] Ph: [phone]

## 2024-05-06 ENCOUNTER — Encounter (HOSPITAL_COMMUNITY)

## 2024-05-06 ENCOUNTER — Telehealth: Payer: Self-pay

## 2024-05-06 NOTE — Patient Instructions (Signed)
 Verneita JONETTA Gambler - I am sorry I was unable to reach you today for our scheduled appointment. I work with [name], MD and am calling to support your healthcare needs. Please contact me at [phone] at your earliest convenience. I look forward to speaking with you soon.   Thank you,  Hendricks Her RN, BSN  [place] [facility] RN Case Manager   Direct Dial -[phone]

## 2024-05-09 ENCOUNTER — Encounter: Admitting: Physical Medicine and Rehabilitation

## 2024-05-09 DIAGNOSIS — L03116 Cellulitis of left lower limb: Secondary | ICD-10-CM | POA: Diagnosis not present

## 2024-05-10 ENCOUNTER — Ambulatory Visit (HOSPITAL_COMMUNITY)

## 2024-05-11 ENCOUNTER — Encounter (HOSPITAL_COMMUNITY): Payer: Self-pay

## 2024-05-11 ENCOUNTER — Other Ambulatory Visit: Payer: Self-pay | Admitting: *Deleted

## 2024-05-11 ENCOUNTER — Telehealth: Payer: Self-pay

## 2024-05-11 ENCOUNTER — Ambulatory Visit (HOSPITAL_COMMUNITY): Admit: 2024-05-11 | Admitting: Internal Medicine

## 2024-05-11 DIAGNOSIS — Z8719 Personal history of other diseases of the digestive system: Secondary | ICD-10-CM

## 2024-05-11 DIAGNOSIS — H532 Diplopia: Secondary | ICD-10-CM | POA: Diagnosis not present

## 2024-05-11 DIAGNOSIS — D500 Iron deficiency anemia secondary to blood loss (chronic): Secondary | ICD-10-CM

## 2024-05-11 DIAGNOSIS — R197 Diarrhea, unspecified: Secondary | ICD-10-CM

## 2024-05-11 SURGERY — COLONOSCOPY
Anesthesia: Choice

## 2024-05-12 ENCOUNTER — Telehealth: Payer: Self-pay | Admitting: *Deleted

## 2024-05-12 ENCOUNTER — Encounter (HOSPITAL_COMMUNITY): Payer: Self-pay

## 2024-05-12 ENCOUNTER — Ambulatory Visit (HOSPITAL_COMMUNITY)

## 2024-05-12 DIAGNOSIS — R2681 Unsteadiness on feet: Secondary | ICD-10-CM | POA: Diagnosis not present

## 2024-05-12 DIAGNOSIS — M6281 Muscle weakness (generalized): Secondary | ICD-10-CM

## 2024-05-12 NOTE — Telephone Encounter (Signed)
 LMOVM to call back to reschedule her procedure with Dr. Shaaron

## 2024-05-12 NOTE — Therapy (Signed)
 OUTPATIENT PHYSICAL THERAPY LOWER EXTREMITY TREATMENT  Progress Note Reporting Period 03/28/24 to 05/12/24  See note below for Objective Data and Assessment of Progress/Goals.     Patient Name: Amber Stephenson MRN: [id number] DOB:December 21, 1945, 78 y.o., female Today's Date: 05/13/2024  END OF SESSION:  PT End of Session - 05/12/24 1422     Visit Number 8    Number of Visits 18    Date for Recertification  05/27/24    Authorization Type Medicare part A and B, with [employer] for life secondary    Authorization Time Period no authorization required    Progress Note Due on Visit 18    PT Start Time 1422    PT Stop Time 1500    PT Time Calculation (min) 38 min    Equipment Utilized During Treatment Gait belt    Activity Tolerance Patient limited by fatigue;Patient limited by pain    Behavior During Therapy [facility] for tasks assessed/performed             Past Medical History:  Diagnosis Date   Allergic rhinitis 02/24/2019   Ambulatory dysfunction 04/23/2020   Atrial fibrillation with RVR (HCC) 05/19/2017   Back pain/sacroiliitis--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   Benign paroxysmal positional vertigo due to bilateral vestibular disorder 05/20/2019   Bipolar 1 disorder (HCC) 01/23/2015   with GAD, benzo dependence   CAD (coronary artery disease)    Nonobstructive; Managed by Dr. Charls   Cardiomegaly 01/12/2018   CHF (congestive heart failure) 06/08/2022   Chronic back pain 01/04/2015   Chronic constipation 04/25/2020   Chronic diastolic heart failure (HCC) 05/30/2015   Chronic pain syndrome 08/22/2019   back pain, sacroiliitis   Chronic post-traumatic stress disorder (PTSD) 12/06/2020   Diabetic neuropathy (HCC) 02/06/2016   Dyslipidemia 09/24/2020   Essential hypertension    Functional diarrhea 10/26/2020   Herpes genitalis in women 07/16/2015   History of  adenomatous polyp of colon 05/21/2019   Overview:   03/31/17: Colonoscopy: nonadvanced adenoma, microscopic colitis, f/u 5 yrs, Murphy/GAP   History of radiation therapy    Endometrial- 02/18/23-03/31/23-Dr. Lynwood Kinard   Hypotension 09/01/2022   Insomnia 01/23/2015   Metabolic acidosis 01/12/2023   Migraine headache with aura 02/12/2016   Myofascial pain dysfunction syndrome 08/22/2019   Non-alcoholic fatty liver disease 01/12/2018   OSA (obstructive sleep apnea) 02/24/2019   10/09/2018 - HST  - AHI 40.6    Osteopenia 12/06/2020   Rx alendronate  35 mg.   Pinguecula 12/06/2020   Presbyopia 12/06/2020   Pulmonary hypertension    RLS (restless legs syndrome) 04/27/2015   RSV (respiratory syncytial virus infection) 06/10/2023   Tremor, essential 12/11/2021   Type II diabetes mellitus (HCC) 09/24/2020   Past Surgical History:  Procedure Laterality Date   BREAST REDUCTION SURGERY     COLONOSCOPY  2018   6 mm cecal tubular adenoma, sigmoid diverticulosis, random colon biopsies consistent with microscopic colitis   COLONOSCOPY N/A 01/22/2024   Procedure: COLONOSCOPY;  Surgeon: Eartha Angelia Sieving, MD;  Location: [facility];  Service: Gastroenterology;  Laterality: N/A;   COLONOSCOPY N/A 02/23/2024   Procedure: COLONOSCOPY;  Surgeon: Eartha Angelia Sieving, MD;  Location: [facility];  Service: Gastroenterology;  Laterality: N/A;   ESOPHAGOGASTRODUODENOSCOPY N/A 01/22/2024   Procedure: EGD (ESOPHAGOGASTRODUODENOSCOPY);  Surgeon: Eartha Angelia, Sieving, MD;  Location: [facility];  Service: Gastroenterology;  Laterality: N/A;   ESOPHAGOGASTRODUODENOSCOPY  N/A 02/23/2024   Procedure: EGD (ESOPHAGOGASTRODUODENOSCOPY);  Surgeon: Eartha Flavors, Toribio, MD;  Location: [facility];  Service: Gastroenterology;  Laterality: N/A;   EYE SURGERY Right    cateracts   HAMMER TOE SURGERY     LEFT HEART CATH AND CORONARY ANGIOGRAPHY N/A 02/03/2018   Procedure: LEFT HEART CATH AND  CORONARY ANGIOGRAPHY;  Surgeon: Jordan, Peter M, MD;  Location: [facility] INVASIVE CV LAB;  Service: Cardiovascular;  Laterality: N/A;   REVERSE SHOULDER ARTHROPLASTY Right 08/19/2019   Procedure: REVERSE SHOULDER ARTHROPLASTY;  Surgeon: Kay Kemps, MD;  Location: [facility];  Service: Orthopedics;  Laterality: Right;  interscalene block   SHOULDER SURGERY Right    I BROKE MY SHOUDLER   THIGH SURGERY     TO REMOVE A TUMOR    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 02/04/2024   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   Osteopenia 02/04/2024   Pain of foot 02/04/2024   Pain of right breast 02/04/2024   Phase of life problem 02/04/2024   Acute gastric ulcer without hemorrhage or perforation 01/23/2024   Hematochezia 01/21/2024   Acute anemia 01/21/2024   ABLA (acute blood loss anemia) 01/19/2024   Essential hypertension 01/07/2024   Rectal bleeding 10/07/2023   Nocturnal enuresis 08/25/2023   Hyponatremia 06/10/2023   Abdominal pain 06/10/2023   Diarrhea 06/10/2023   Candidiasis of mouth 05/15/2023   Type 2 diabetes mellitus with hyperglycemia, with long-term current use of insulin  (HCC) 02/12/2023   Long term (current) use of oral hypoglycemic drugs 02/12/2023   Primary hypertension 01/14/2023   Endometrial cancer (HCC) 01/12/2023   Vulvar itching 12/22/2022   Burning with urination 11/13/2022   Vitamin B12 deficiency 11/09/2022   Frequent falls 09/01/2022   AKI (acute kidney injury) 06/27/2022   Hemorrhoids 03/11/2022   Microalbuminuria 01/21/2022   Tremor, essential 12/11/2021   Chronic bilateral low back pain with right-sided sciatica 07/19/2021   Vaginal itching 04/16/2021    Sensory ataxia 03/28/2021   Elevated troponin 12/14/2020   Chronic post-traumatic stress disorder (PTSD) 12/06/2020   Senile corneal changes 12/06/2020   Postmenopausal bleeding 12/06/2020   Acquired hammer toe 12/06/2020   Opioid dependence 12/06/2020   Hypermetropia 12/06/2020   Generalized weakness 12/06/2020   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   Generalized anxiety disorder 12/06/2020   Functional diarrhea 10/26/2020   Insulin  dependent type 2 diabetes mellitus (HCC) 09/24/2020   Chronic respiratory failure with hypoxia (HCC) 04/25/2020   Chronic constipation 04/25/2020   Chronic pain syndrome 08/22/2019   CAD (coronary artery disease)    Hypocalcemia    S/P shoulder replacement, 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   Mixed hyperlipidemia 05/20/2019   Vertigo 04/25/2019   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   OSA (obstructive sleep apnea) 02/24/2019   At risk for adverse drug event 07/06/2018   Non-alcoholic fatty liver disease 01/12/2018   Mild intermittent asthma 01/12/2018   Senile osteoporosis 12/15/2017   Atrial fibrillation with RVR (HCC) 05/19/2017   Migraine headache with aura 02/12/2016   Diabetic neuropathy (HCC) 02/06/2016   Bipolar 1 disorder (HCC) 10/04/2015   Major depressive disorder 10/03/2015   Herpes genitalis in women 07/16/2015   Long term current use of anticoagulant 05/30/2015   Chronic  heart failure with preserved ejection fraction (HFpEF) (HCC) 05/30/2015   RLS (restless legs syndrome) 04/27/2015   Insomnia 01/23/2015   Chronic atrial fibrillation with RVR (HCC) 01/04/2015    PCP: [name], MD  REFERRING PROVIDER: Margrette Taft BRAVO, MD   REFERRING DIAG: Gait disturbance, Contusion of right knee, initial encounter, Primary osteoarthritis of right knee   THERAPY DIAG:  Unsteadiness on feet  Muscle weakness (generalized)  Rationale for Evaluation and Treatment: Rehabilitation  ONSET DATE: November 2024  SUBJECTIVE:   SUBJECTIVE  STATEMENT: Pt states her back and head are still hurting from falls before last session. Comes today with walker, states she is scared of it, trying to get used to it.    EVAL: Patient reports that her balance is her primary concern as her right knee has gotten better. She feels that her balance has been getting worse since November 2024. She has tried using a cane and a walker, but she does not feel steady or safe using an assistive device. She is primarily a furniture walker for balance. She has fallen 3-4 times in the past 6 months. She notes that she has had multiple colonoscopies over the previous months due to abdominal pain. She has been having a lot of pain over the past few days and she is scheduled for another colonoscopy in December.   PERTINENT HISTORY: Atrial fibrillation, CHF, diabetic neuropathy, osteoporosis, bipolar 1 disorder, asthma, PTSD, anxiety, and history of cancer PAIN:  Are you having pain? Yes: NPRS scale: 10/10 Pain location: back  Pain description: sharp Aggravating factors: walking Relieving factors: rest and elevation  PRECAUTIONS: Fall  RED FLAGS: None   WEIGHT BEARING RESTRICTIONS: No  FALLS:  Has patient fallen in last 6 months? Yes. Number of falls 3-4  LIVING ENVIRONMENT: Lives with: lives with their spouse Lives in: House/apartment Stairs: Yes: Internal: 1 flight steps; on right going up; patient avoids these steps due to difficulty and instability Has following equipment at home: Quad cane small base, Walker - 4 wheeled, shower chair, and Grab bars  OCCUPATION: retired  PLOF: Independent  PATIENT GOALS: be able to go shopping and improved safety  NEXT MD VISIT: none scheduled  OBJECTIVE:  Note: Objective measures were completed at Evaluation unless otherwise noted.  DIAGNOSTIC FINDINGS: 01/29/24 right knee x-ray IMPRESSION: Moderate prepatellar soft tissue swelling is noted suggesting traumatic injury. No fracture or dislocation is  noted.  PATIENT SURVEYS:  ABC scale: The Activities-Specific Balance Confidence (ABC) Scale 0% 10 20 30  40 50 60 70 80 90 100% No confidence<->completely confident  How confident are you that you will not lose your balance or become unsteady when you . . .   Date tested 03/28/24  Walk around the house 20%  2. Walk up or down stairs 10%  3. Bend over and pick up a slipper from in front of a closet floor 10%  4. Reach for a small can off a shelf at eye level 80%  5. Stand on tip toes and reach for something above your head 40%  6. Stand on a chair and reach for something 0%  7. Sweep the floor 0%  8. Walk outside the house to a car parked in the driveway 50%  9. Get into or out of a car 60%  10. Walk across a parking lot to the mall 0%  11. Walk up or down a ramp 40%  12. Walk in a crowded mall where people rapidly walk past you 10%  13.  Are bumped into by people as you walk through the mall 0%  14. Step onto or off of an escalator while you are holding onto the railing 40%  15. Step onto or off an escalator while holding onto parcels such that you cannot hold onto the railing 0%  16. Walk outside on icy sidewalks 0%  Total: #/16 22.5%      05/12/24: ABC Scale: 450 / 1600 = 28.1 %  COGNITION: Overall cognitive status: Within functional limits for tasks assessed     SENSATION: Light touch: Impaired  and increased sensitivity to light touch in bilateral lower 1/3 of the tibia Patient reports numbness or tingling in her feet and lower legs.   EDEMA:  No edema observed  POSTURE: rounded shoulders, forward head, and flexed trunk   LOWER EXTREMITY ROM: unable to assess secondary to pain   LOWER EXTREMITY MMT: high pain severity and irritability with MMT which limited full assessment   MMT Right eval Left eval  Hip flexion 4-/5 3+/5  Hip extension    Hip abduction    Hip adduction    Hip internal rotation    Hip external rotation    Knee flexion    Knee extension     Ankle dorsiflexion 4-/5 4-/5  Ankle plantarflexion    Ankle inversion    Ankle eversion     (Blank rows = not tested)  FUNCTIONAL TESTS: will be completed a following appointments, as able 5 times sit to stand: 16 seconds on 04/04/24, BUE push up from arm rests Timed up and go (TUG): 21 seconds on 04/04/24, no AD 2 minute walk test: 194 ft on 04/04/24, no AD, pt SOB 05/12/24: TUG: 39.06 seconds with walker, 17.5 seconds without walker 5TSTS: 11.26 seconds   GAIT: Assistive device utilized: None Level of assistance: SBA Comments: very unsteady with ambulation, required external support from the wall and other objects for safety                                                                                                                                TREATMENT DATE: 05/12/2024  Progress note: Goals tracked, TUG, ABC Scale completed, tennis balls given for sliders on back of walker, educated on importance of getting used to using AD as pt has fallen several times attempting to get around with out one at home. Neuromuscular Re-education: -Shoulder extensions/rows, 2 set of 10 reps, GTB at chest level on web slide, pt cued for sequencing and SBA for safety -Resisted walking with ankle weights, lateral stepping, walking marches/butt kicks, 4 lap each variation on 10 foot line, 3lb ankle weights  Therapeutic Activity: -Walking obstacle course, 2 laps, 20 feet per lap, with 3 small hurdles and 5 cones for weaving pattern, pt cued for upright posture and slight bend in knees -Sit to stands, 2 sets of 7 reps, pt cued for core activation and keeping weight close to chest  05/04/24 Ambulation  with SPC and CGA-minA 200 feet Seated:  LAQ 10X5 (diff with seated balance)  Sit to stands no UE 10X Supine:  bridge 10X  SLR 2 sets 5 reps (AAROM to complete in full ROM and form)  LTR 10X due to discomfort/stiffness Sidelying hip abduction 10X each side with AAROM for form and  ROM  05/02/24: STS, no UE use, 10x STS + shoulder flexion to shoulder height, 2 lb bar, 10x STS + UE lift + march, 10x Side stepping, 20 ftx2, CGA  +RTB, around knees, 20 ftx2, CGA Backwards ambulation, RTB, around knees, 20 ftx2 Ambulation with QC, 66 ft --> 33 ft, CGA/min A and verbal cues    PATIENT EDUCATION:  Education details: POC, prognosis, pain, progress with physical therapy, and following up with her referring physician Person educated: Patient Education method: Explanation Education comprehension: verbalized understanding  HOME EXERCISE PROGRAM: Access Code: [email or web address] URL: [email or web address] Date: 04/04/2024 Prepared by: Rosaria Powell-Butler  Exercises - Sit to Stand  - 2 x daily - 7 x weekly - 2 sets - 10 reps - Seated Heel Toe Raises  - 2 x daily - 7 x weekly - 2 sets - 10 reps - Seated Long Arc Quad  - 2 x daily - 7 x weekly - 2 sets - 10 reps - Standing Hip Abduction with Counter Support  - 2 x daily - 7 x weekly - 2 sets - 10 reps  Access Code: [email or web address] URL: [email or web address] Date: 05/04/2024 - Supine Bridge  - 2 x daily - 7 x weekly - 10 reps - Small Range Straight Leg Raise  - 2 x daily - 7 x weekly - 10 reps - Sidelying Hip Abduction  - 2 x daily - 7 x weekly - 10 reps  ASSESSMENT:  CLINICAL IMPRESSION:  Patient continues to demonstrate decreased LE strength, decreased gait quality and balance. Patient also demonstrates decreased knowledge of navigation with RW as pt scores lower time without device at this date. Pt demonstrates bouts of lethargy during session and requires several seated rest breaks during today's session. Patient able to progress dynamic balance and core activation exercises today with hurdle and cone obstacle course and standing shoulder rows, good performance with verbal cueing. Patient would continue to benefit from skilled physical therapy for increased endurance with ambulation, increased LE strength,  and improved balance for improved quality of life, improved independence with gait training with RW for decreased falls risk and continued progress towards therapy goals.      EVAL: Patient is a 78 y.o. female who was seen today for physical therapy evaluation and treatment for unsteadiness on her feet and history of falling. She is a high fall risk as evidenced by her gait mechanics and her history of falling. Her full objective assessment was unable to be completed due to her high pain severity and irritability. She requested to leave early secondary to her pain levels. Recommend that she continue with skilled physical therapy to address her impairments to maximize her safety and functional mobility.    OBJECTIVE IMPAIRMENTS: Abnormal gait, decreased activity tolerance, decreased balance, decreased knowledge of use of DME, decreased mobility, difficulty walking, decreased strength, postural dysfunction, and pain.   ACTIVITY LIMITATIONS: carrying, bending, standing, stairs, transfers, and locomotion level  PARTICIPATION LIMITATIONS: meal prep, cleaning, laundry, shopping, and community activity  PERSONAL FACTORS: Past/current experiences, Time since onset of injury/illness/exacerbation, and 3+ comorbidities: Atrial fibrillation, CHF, diabetic neuropathy, osteoporosis, bipolar 1 disorder, asthma, PTSD, anxiety, and history of cancer are also  affecting patient's functional outcome.   REHAB POTENTIAL: Fair    CLINICAL DECISION MAKING: Unstable/unpredictable  EVALUATION COMPLEXITY: High   GOALS: Goals reviewed with patient? Yes  LONG TERM GOALS: Target date: 04/25/24  Patient will be independent with her HEP.  Baseline:  Goal status: MET  2.  Patient will improve her ABC by at least 10% for improved perceived function with her daily activities.  Baseline:  Goal status: Progressing  3.  Patient will be able to complete the timed up and go in 12 seconds or less to reduce her fall risk.   Baseline:  Goal status: Progressing  4.  Patient will be able to demonstrate proper utilization of the least restrictive assistive device to improve her community mobility.  Baseline: (performs worse on TUG 05/12/24 with RW Goal status: Progressing   PLAN:  PT FREQUENCY: 2x/week  PT DURATION: 4 weeks  PLANNED INTERVENTIONS: 97164- PT Re-evaluation, 97750- Physical Performance Testing, 97110-Therapeutic exercises, 97530- Therapeutic activity, [id number]- Neuromuscular re-education, 97535- Self Care, [id number]- Manual therapy, [id number]- Gait training, Patient/Family education, Balance training, Stair training, Cryotherapy, and Moist heat  PLAN FOR NEXT SESSION: gait training with LRAD, balance interventions, lower extremity strengthening. Follow up if using RW, completing HEP and if she's had any falls.  Extending for 5 weeks, 2 visits per week PN visit 8 (05/12/24).  Lang Ada, PT, DPT [facility] Office: [phone] 11:52 AM, 05/13/2024

## 2024-05-13 ENCOUNTER — Telehealth: Payer: Self-pay

## 2024-05-13 NOTE — Patient Instructions (Signed)
 Amber Stephenson - I am sorry I was unable to reach you today for our scheduled appointment. I work with [name], MD and am calling to support your healthcare needs. Please contact me at [phone] at your earliest convenience. I look forward to speaking with you soon.   Thank you,  Hendricks Her RN, BSN  [place] [facility] RN Case Manager   Direct Dial -[phone]

## 2024-05-17 ENCOUNTER — Encounter (HOSPITAL_COMMUNITY): Payer: Self-pay

## 2024-05-17 ENCOUNTER — Other Ambulatory Visit: Payer: Self-pay

## 2024-05-17 ENCOUNTER — Ambulatory Visit (HOSPITAL_COMMUNITY)

## 2024-05-17 DIAGNOSIS — R2681 Unsteadiness on feet: Secondary | ICD-10-CM

## 2024-05-17 DIAGNOSIS — M6281 Muscle weakness (generalized): Secondary | ICD-10-CM

## 2024-05-17 NOTE — Patient Outreach (Signed)
 Complex Care Management   Visit Note  05/17/2024  Name:  LINN CLAVIN MRN: [id number] DOB: [birth year]  Situation: Referral received for Complex Care Management related to Atrial Fibrillation I obtained verbal consent from Patient.  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   Benign paroxysmal positional vertigo due to bilateral vestibular disorder 05/20/2019   Bipolar 1 disorder (HCC) 01/23/2015   with GAD, benzo dependence   CAD (coronary artery disease)    Nonobstructive; Managed by Dr. Charls   Cardiomegaly 01/12/2018   CHF (congestive heart failure) 06/08/2022   Chronic back pain 01/04/2015   Chronic constipation 04/25/2020   Chronic diastolic heart failure (HCC) 05/30/2015   Chronic pain syndrome 08/22/2019   back pain, sacroiliitis   Chronic post-traumatic stress disorder (PTSD) 12/06/2020   Diabetic neuropathy (HCC) 02/06/2016   Dyslipidemia 09/24/2020   Essential hypertension    Functional diarrhea 10/26/2020   Herpes genitalis in women 07/16/2015   History of adenomatous polyp of colon 05/21/2019   Overview:   03/31/17: Colonoscopy: nonadvanced adenoma, microscopic colitis, f/u 5 yrs, [facility]/GAP   History of radiation therapy    Endometrial- 02/18/23-03/31/23-Dr. Lynwood Kinard   Hypotension 09/01/2022   Insomnia 01/23/2015   Metabolic acidosis 01/12/2023   Migraine headache with aura 02/12/2016   Myofascial pain dysfunction syndrome 08/22/2019   Non-alcoholic fatty liver disease 01/12/2018   OSA (obstructive sleep apnea) 02/24/2019   10/09/2018 - HST  - AHI 40.6    Osteopenia 12/06/2020   Rx alendronate  35 mg.   Pinguecula  12/06/2020   Presbyopia 12/06/2020   Pulmonary hypertension    RLS (restless legs syndrome) 04/27/2015   RSV (respiratory syncytial virus infection) 06/10/2023   Tremor, essential 12/11/2021   Type II diabetes mellitus (HCC) 09/24/2020    Assessment: Patient Reported Symptoms:  Cognitive Cognitive Status: Able to follow simple commands, Other: Cognitive/Intellectual Conditions Management [RPT]: None reported or documented in medical history or problem list   Health Maintenance Behaviors: Annual physical exam Healing Pattern: Slow Health Facilitated by: Rest  Neurological Neurological Review of Symptoms: Vision changes Neurological Management Strategies: Coping strategies Neurological Self-Management Outcome: 4 (good) Neurological Comment: New RX for glasses  HEENT HEENT Symptoms Reported: No symptoms reported HEENT Management Strategies: Medication therapy, Routine screening HEENT Self-Management Outcome: 4 (good)    Cardiovascular Cardiovascular Symptoms Reported: Irregular pulse Does patient have uncontrolled Hypertension?: No Cardiovascular Management Strategies: Routine screening, Coping strategies Weight: 162 lb (73.5 kg) Cardiovascular Self-Management Outcome: 4 (good) Cardiovascular Comment: weighing daily  Respiratory Respiratory Symptoms Reported: No symptoms reported Respiratory Management Strategies: Routine screening Respiratory Self-Management Outcome: 4 (good)  Endocrine Endocrine Symptoms Reported: No symptoms reported Is patient diabetic?: Yes Is patient checking blood sugars at home?: Yes List most recent blood sugar readings, include date and time of day: 175 post prandial Endocrine Self-Management Outcome: 4 (good) Endocrine Comment: diabetic diet  Gastrointestinal Gastrointestinal Symptoms Reported: No symptoms reported Gastrointestinal Management Strategies: Medication therapy Gastrointestinal Self-Management Outcome: 4 (good)    Genitourinary  Genitourinary Symptoms Reported: No symptoms reported Genitourinary Management Strategies: Adequate rest, Coping strategies Genitourinary Self-Management Outcome: 4 (good) Genitourinary Comment: recent UTI  Medication  Integumentary Integumentary Symptoms Reported: Bruising Other Integumentary Symptoms: Bruising  between legs above knees  Goes away and then comes back Skin Management Strategies: Adequate rest Skin Self-Management Outcome: 4 (good)  Musculoskeletal Musculoskelatal Symptoms Reviewed: Difficulty walking, Unsteady gait Musculoskeletal Management Strategies: Coping strategies, Routine screening Musculoskeletal Self-Management Outcome: 4 (good) Falls in the past year?: Yes Number of falls in past year: 2 or more Was there an injury with Fall?: Yes (broke 2 ribs in previous fall - 2 months ago) Fall Risk Category Calculator: 3 Patient Fall Risk Level: High Fall Risk Patient at Risk for Falls Due to: History of fall(s), Impaired balance/gait Fall risk Follow up: Falls evaluation completed, Education provided, Falls prevention discussed  Psychosocial Psychosocial Symptoms Reported: No symptoms reported Behavioral Management Strategies: Medication therapy Behavioral Health Self-Management Outcome: 4 (good)   Quality of Family Relationships: helpful, involved, supportive Do you feel physically threatened by others?: No    05/17/2024    PHQ2-9 Depression Screening   Little interest or pleasure in doing things Not at all  Feeling down, depressed, or hopeless Not at all  PHQ-2 - Total Score 0  Trouble falling or staying asleep, or sleeping too much    Feeling tired or having little energy    Poor appetite or overeating     Feeling bad about yourself - or that you are a failure or have let yourself or your family down    Trouble concentrating on things, such as reading the newspaper or watching television    Moving or speaking so slowly that other people could have noticed.  Or  the opposite - being so fidgety or restless that you have been moving around a lot more than usual    Thoughts that you would be better off dead, or hurting yourself in some way    PHQ2-9 Total Score    If you checked off any problems, how difficult have these problems made it for you to do your work, take care of things at home, or get along with other people    Depression Interventions/Treatment      Today's Vitals   05/17/24 1128  BP: (!) 106/59  Weight:    Pain Scale: 0-10 Pain Score: 0-No pain  Medications Reviewed Today     Reviewed by Kay Hendricks MATSU, RN (Case Manager) on 05/17/24 at 1123  Med List Status: <None>   Medication Order Taking? Sig Documenting Provider Last Dose Status Informant  acetaminophen  (TYLENOL ) 325 MG tablet [id number] Yes Take 2 tablets (650 mg total) by mouth every 6 (six) hours as needed for mild pain (pain score 1-3) or fever (or Fever >/= 101). Pearlean Manus, MD  Active Spouse/Significant Other, Pharmacy Records  ALPRAZolam  (XANAX ) 1 MG tablet [id number] Yes Take 1 mg by mouth at bedtime as needed for sleep. [provider]  Active Spouse/Significant Other, Pharmacy Records  ARIPiprazole  (ABILIFY ) 2 MG tablet [id number] Yes Take 1 tablet (2 mg total) by mouth at bedtime.  Patient taking differently: Take 2 mg by mouth 2 (two) times daily.   Pearlean Manus, MD  Active Spouse/Significant Other, Pharmacy Records  atorvastatin  (LIPITOR) 80 MG tablet [id number] Yes Take 1 tablet (80 mg total) by mouth daily. Pearlean Manus, MD  Active Spouse/Significant Other, Pharmacy Records  bupivacaine -epinephrine  (MARCAINE  W/ EPI) 0.5% -1:200000 injection [id number]   Hollis, Kevin D, MD  Active   carbidopa -levodopa  (SINEMET ) 10-100 MG tablet [id number] Yes Take 1 tablet by mouth 4 (four) times daily. For restless leg syndrome Pearlean Manus, MD  Active Spouse/Significant Other, Pharmacy Records  colchicine  0.6 MG  tablet [id number]  Take 1 tablet (0.6 mg total)  by mouth every 12 (twelve) hours as needed (gout flare.). For mouth sores  Patient not taking: Reported on 05/17/2024   Ricky Fines, MD  Active   Continuous Glucose Sensor (DEXCOM G7 SENSOR) [place] [id number] Yes CHANGE SENSOR EVERY 10 DAYS Shamleffer, Ibtehal Jaralla, MD  Active   cyanocobalamin  (VITAMIN B12) 1000 MCG tablet [id number] Yes Take 1 tablet (1,000 mcg total) by mouth daily. Raenelle Donalda HERO, MD  Active Spouse/Significant Other, Pharmacy Records  cyanocobalamin  (VITAMIN B12) injection 1,000 mcg [id number]   [name], MD  Active   denosumab  (PROLIA ) injection 60 mg [id number]   Stacks, Warren, MD  Active   diphenoxylate -atropine  (LOMOTIL ) 2.5-0.025 MG tablet [id number] Yes TAKE 2 TABLETS BY MOUTH FOUR TIMES DAILY AS NEEDED FOR DIARRHEA OR LOOSE LEELAND [name], MD  Active Spouse/Significant Other, Pharmacy Records  doxycycline  (VIBRA -TABS) 100 MG tablet [id number] Yes Take 1 tablet (100 mg total) by mouth 2 (two) times daily. 1 po bid Gladis Mustard, FNP  Active   ELIQUIS  5 MG TABS tablet [id number] Yes TAKE 1 TABLET TWICE A DAY Turner, Wilbert SAUNDERS, MD  Active Spouse/Significant Other, Pharmacy Records  escitalopram  (LEXAPRO ) 10 MG tablet [id number] Yes Take 10 mg by mouth daily. [provider]  Active Spouse/Significant Other, Pharmacy Records           Med Note (WARD, ANGELICA G   Tue Jan 12, 2024  2:28 PM)    ferrous sulfate  325 (65 FE) MG EC tablet [id number] Yes Take 1 tablet (325 mg total) by mouth 2 (two) times daily. Ricky Fines, MD  Active   fluconazole  (DIFLUCAN ) 150 MG tablet [id number] Yes Take 1 tablet (150 mg total) by mouth every 3 (three) days. Lavell Lye A, FNP  Active   furosemide  (LASIX ) 40 MG tablet [id number] Yes Take 1 tablet (40 mg total) by mouth 2 (two) times daily. Hayes Beckey CROME, NP  Active   gabapentin  (NEURONTIN ) 100 MG capsule [id number] Yes Take 1 capsule (100 mg total) by mouth 3 (three) times daily. Pearlean Manus, MD  Active  Spouse/Significant Other, Pharmacy Records  hydrocortisone  (ANUSOL -HC) 2.5 % rectal cream [id number]  Place rectally 2 (two) times daily.  Patient not taking: Reported on 05/17/2024   Ricky Fines, MD  Active   hydrOXYzine  (VISTARIL ) 25 MG capsule [id number] Yes Take 1 capsule (25 mg total) by mouth every 8 (eight) hours as needed for anxiety. Pearlean Manus, MD  Active Spouse/Significant Other, Pharmacy Records  insulin  glargine (LANTUS  SOLOSTAR) 100 UNIT/ML Solostar Pen [id number] Yes Inject 10 Units into the skin at bedtime.  Patient taking differently: Inject 8 Units into the skin at bedtime.   Pearlean Manus, MD  Active Spouse/Significant Other, Pharmacy Records  insulin  lispro (HUMALOG  KWIKPEN) 100 UNIT/ML KwikPen [id number]  Inject 0-10 Units into the skin 2 (two) times daily before a meal. Sliding scale Short acting Humalog  insulin  per sliding scale 0-10 ---:-- Insulin  injection 0-10 Units 0-10 Units Subcutaneous, 3 times daily with meals CBG 70 - 120: 0 unit CBG 121 - 150: 0 unit  CBG 151 - 200: 1 unit CBG 201 - 250: 2 units CBG 251 - 300: 4 units CBG 301 - 350: 6 units  CBG 351 - 400: 8 units  CBG > 400: 10 units  Patient not taking: Reported on 05/17/2024   Pearlean Manus, MD  Active Spouse/Significant Other, Pharmacy Records  Insulin  Pen Needle 29G X MISC [id number] Yes 1 Device by Does not  apply route daily in the afternoon. Shamleffer, Donell Cardinal, MD  Active Spouse/Significant Other, Pharmacy Records  ketoconazole  (NIZORAL ) 2 % cream [id number]  Apply 1 Application topically daily.  Patient not taking: Reported on 05/17/2024   Lavell Lye A, FNP  Active   lidocaine  (LIDODERM ) 5 % [id number] Yes Place 1 patch onto the skin daily. Remove & Discard patch within 12 hours or as directed by MD Ricky Fines, MD  Active   lidocaine  (XYLOCAINE ) 5 % ointment [id number] Yes Apply 1 Application topically 4 (four) times daily as needed. For foot and ankle nerve pain-  can use up to  4x/day as needed Lovorn, Megan, MD  Active   losartan  (COZAAR ) 25 MG tablet [id number] Yes Take 25 mg by mouth daily. [provider]  Active Spouse/Significant Other, Pharmacy Records  MAGNESIUM  PO [id number] Yes Take 1 tablet by mouth daily. [provider]  Active Spouse/Significant Other, Pharmacy Records  Methocarbamol  1000 MG TABS [id number] Yes Take 1,000 mg by mouth every 6 (six) hours as needed. Lovorn, Megan, MD  Active Spouse/Significant Other, Pharmacy Records  metoprolol  tartrate (LOPRESSOR ) 50 MG tablet [id number] Yes Take 1 tablet (50 mg total) by mouth 2 (two) times daily. Pearlean Manus, MD  Active Spouse/Significant Other, Pharmacy Records  Misc. Devices (3-IN-1 BEDSIDE TOILET) MISC [id number] Yes Length of need 99 months M17.11 Osteoarthritis right knee / multiple falls / high risk fall Margrette Taft BRAVO, MD  Active Spouse/Significant Other, Pharmacy Records  nystatin  (MYCOSTATIN ) 100000 UNIT/ML suspension [id number] Yes Take 5 mLs (500,000 Units total) by mouth 4 (four) times daily. Lavell Lye A, FNP  Active   ondansetron  (ZOFRAN ) 4 MG tablet [id number] Yes Take 1 tablet (4 mg total) by mouth every 8 (eight) hours as needed for nausea or vomiting. For cancer related nausea Dettinger, Fonda LABOR, MD  Active Spouse/Significant Other, Pharmacy Records  OXcarbazepine  (TRILEPTAL ) 150 MG tablet [id number] Yes Take 1 tablet (150 mg total) by mouth 2 (two) times daily. Sheikh, Omair Odell, DO  Active Spouse/Significant Other, Pharmacy Records  oxyCODONE  (OXYCONTIN ) 20 mg 12 hr tablet [id number] Yes Take 1 tablet (20 mg total) by mouth every 12 (twelve) hours. G89.3- chronic pain- fill 28 days after 04/23/2024 Debby Fidela CROME, NP  Active   pantoprazole  (PROTONIX ) 40 MG tablet [id number] Yes Take 1 tablet (40 mg total) by mouth 2 (two) times daily. Pearlean Manus, MD  Active Spouse/Significant Other, Pharmacy Records  potassium chloride  (KLOR-CON ) 10 MEQ tablet [id number] Yes  Take 1 tablet (10 mEq total) by mouth daily. Shlomo Wilbert SAUNDERS, MD  Active Spouse/Significant Other, Pharmacy Records  pregabalin  (LYRICA ) 25 MG capsule [id number] Yes Take 1 capsule (25 mg total) by mouth 3 (three) times daily. Will start at at bedtime x 4 days then BID x 4 days, then TID- for nerve pain  Patient taking differently: Take 25 mg by mouth 2 (two) times daily.   Lovorn, Megan, MD  Active   rOPINIRole  (REQUIP ) 2 MG tablet [id number]  Take 1 tablet (2 mg total) by mouth at bedtime.  Patient taking differently: Take 2 mg by mouth 2 (two) times daily.   [name], MD  Active Spouse/Significant Other, Pharmacy Records  spironolactone  (ALDACTONE ) 25 MG tablet [id number]  Take 0.5 tablets (12.5 mg total) by mouth daily.  Patient not taking: Reported on 05/17/2024   Hayes Beckey CROME, NP  Active   tirzepatide  (MOUNJARO ) 12.5 MG/0.5ML Pen [id number] Yes Inject 12.5 mg into the skin once a week. Shamleffer, Donell Cardinal, MD  Active  vitamin C  (VITAMIN C ) 500 MG tablet [id number] Yes Take 0.5 tablets (250 mg total) by mouth 2 (two) times daily. Ricky Fines, MD  Active   Vitamin D , Cholecalciferol , 25 MCG (1000 UT) TABS [id number] Yes Take 1,000 Units by mouth in the morning. [provider]  Active Spouse/Significant Other, Pharmacy Records  Med List Note (Ward, Angelica, CPhT 01/12/24 1617): [employer] insurance, pt's spouse assists with meds            Recommendation:   Continue Current Plan of Care  Follow Up Plan:   Telephone follow-up in 1 month  Hendricks Her RN, BSN  [place] [facility] RN Case Manager   Direct Dial [phone]

## 2024-05-17 NOTE — Therapy (Signed)
 OUTPATIENT PHYSICAL THERAPY LOWER EXTREMITY TREATMENT  Progress Note Reporting Period 03/28/24 to 05/12/24  See note below for Objective Data and Assessment of Progress/Goals.     Patient Name: Amber Stephenson MRN: [id number] DOB:[birth year], 78 y.o., female Today's Date: 05/17/2024  END OF SESSION:  PT End of Session - 05/17/24 1418     Visit Number 9    Number of Visits 18    Date for Recertification  05/27/24    Authorization Type Medicare part A and B, with [employer] for life secondary    Authorization Time Period no authorization required    Progress Note Due on Visit 18    PT Start Time 1338    PT Stop Time 1416    PT Time Calculation (min) 38 min    Equipment Utilized During Treatment Gait belt    Activity Tolerance Patient limited by fatigue;Patient limited by lethargy    Behavior During Therapy [facility] for tasks assessed/performed              Past Medical History:  Diagnosis Date   Allergic rhinitis 02/24/2019   Ambulatory dysfunction 04/23/2020   Atrial fibrillation with RVR (HCC) 05/19/2017   Back pain/sacroiliitis--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   Benign paroxysmal positional vertigo due to bilateral vestibular disorder 05/20/2019   Bipolar 1 disorder (HCC) 01/23/2015   with GAD, benzo dependence   CAD (coronary artery disease)    Nonobstructive; Managed by Dr. Charls   Cardiomegaly 01/12/2018   CHF (congestive heart failure) 06/08/2022   Chronic back pain 01/04/2015   Chronic constipation 04/25/2020   Chronic diastolic heart failure (HCC) 05/30/2015   Chronic pain syndrome 08/22/2019   back pain, sacroiliitis   Chronic post-traumatic stress disorder (PTSD) 12/06/2020   Diabetic neuropathy (HCC) 02/06/2016   Dyslipidemia 09/24/2020   Essential hypertension    Functional diarrhea 10/26/2020   Herpes genitalis in women 07/16/2015   History of  adenomatous polyp of colon 05/21/2019   Overview:   03/31/17: Colonoscopy: nonadvanced adenoma, microscopic colitis, f/u 5 yrs, Murphy/GAP   History of radiation therapy    Endometrial- 02/18/23-03/31/23-Dr. Lynwood Kinard   Hypotension 09/01/2022   Insomnia 01/23/2015   Metabolic acidosis 01/12/2023   Migraine headache with aura 02/12/2016   Myofascial pain dysfunction syndrome 08/22/2019   Non-alcoholic fatty liver disease 01/12/2018   OSA (obstructive sleep apnea) 02/24/2019   10/09/2018 - HST  - AHI 40.6    Osteopenia 12/06/2020   Rx alendronate  35 mg.   Pinguecula 12/06/2020   Presbyopia 12/06/2020   Pulmonary hypertension    RLS (restless legs syndrome) 04/27/2015   RSV (respiratory syncytial virus infection) 06/10/2023   Tremor, essential 12/11/2021   Type II diabetes mellitus (HCC) 09/24/2020   Past Surgical History:  Procedure Laterality Date   BREAST REDUCTION SURGERY     COLONOSCOPY  2018   6 mm cecal tubular adenoma, sigmoid diverticulosis, random colon biopsies consistent with microscopic colitis   COLONOSCOPY N/A 01/22/2024   Procedure: COLONOSCOPY;  Surgeon: Eartha Angelia Sieving, MD;  [facility];  Service: Gastroenterology;  Laterality: N/A;   COLONOSCOPY N/A 02/23/2024   Procedure: COLONOSCOPY;  Surgeon: Eartha Angelia Sieving, MD;  Location: [facility];  Service: Gastroenterology;  Laterality: N/A;   ESOPHAGOGASTRODUODENOSCOPY N/A 01/22/2024   Procedure: EGD (ESOPHAGOGASTRODUODENOSCOPY);  Surgeon: Eartha Angelia, Sieving, MD;  Location: [facility];  Service: Gastroenterology;  Laterality: N/A;  ESOPHAGOGASTRODUODENOSCOPY N/A 02/23/2024   Procedure: EGD (ESOPHAGOGASTRODUODENOSCOPY);  Surgeon: Eartha Flavors, Toribio, MD;  Location: [facility];  Service: Gastroenterology;  Laterality: N/A;   EYE SURGERY Right    cateracts   HAMMER TOE SURGERY     LEFT HEART CATH AND CORONARY ANGIOGRAPHY N/A 02/03/2018   Procedure: LEFT HEART CATH AND  CORONARY ANGIOGRAPHY;  Surgeon: Jordan, Peter M, MD;  Location: [facility];  Service: Cardiovascular;  Laterality: N/A;   REVERSE SHOULDER ARTHROPLASTY Right 08/19/2019   Procedure: REVERSE SHOULDER ARTHROPLASTY;  Surgeon: Kay Kemps, MD;  Location: [facility];  Service: Orthopedics;  Laterality: Right;  interscalene block   SHOULDER SURGERY Right    I BROKE MY SHOUDLER   THIGH SURGERY     TO REMOVE A TUMOR    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 02/04/2024   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   Osteopenia 02/04/2024   Pain of foot 02/04/2024   Pain of right breast 02/04/2024   Phase of life problem 02/04/2024   Acute gastric ulcer without hemorrhage or perforation 01/23/2024   Hematochezia 01/21/2024   Acute anemia 01/21/2024   ABLA (acute blood loss anemia) 01/19/2024   Essential hypertension 01/07/2024   Rectal bleeding 10/07/2023   Nocturnal enuresis 08/25/2023   Hyponatremia 06/10/2023   Abdominal pain 06/10/2023   Diarrhea 06/10/2023   Candidiasis of mouth 05/15/2023   Type 2 diabetes mellitus with hyperglycemia, with long-term current use of insulin  (HCC) 02/12/2023   Long term (current) use of oral hypoglycemic drugs 02/12/2023   Primary hypertension 01/14/2023   Endometrial cancer (HCC) 01/12/2023   Vulvar itching 12/22/2022   Burning with urination 11/13/2022   Vitamin B12 deficiency 11/09/2022   Frequent falls 09/01/2022   AKI (acute kidney injury) 06/27/2022   Hemorrhoids 03/11/2022   Microalbuminuria 01/21/2022   Tremor, essential 12/11/2021   Chronic bilateral low back pain with right-sided sciatica 07/19/2021   Vaginal itching 04/16/2021    Sensory ataxia 03/28/2021   Elevated troponin 12/14/2020   Chronic post-traumatic stress disorder (PTSD) 12/06/2020   Senile corneal changes 12/06/2020   Postmenopausal bleeding 12/06/2020   Acquired hammer toe 12/06/2020   Opioid dependence 12/06/2020   Hypermetropia 12/06/2020   Generalized weakness 12/06/2020   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   Generalized anxiety disorder 12/06/2020   Functional diarrhea 10/26/2020   Insulin  dependent type 2 diabetes mellitus (HCC) 09/24/2020   Chronic respiratory failure with hypoxia (HCC) 04/25/2020   Chronic constipation 04/25/2020   Chronic pain syndrome 08/22/2019   CAD (coronary artery disease)    Hypocalcemia    S/P shoulder replacement, 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   Mixed hyperlipidemia 05/20/2019   Vertigo 04/25/2019   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   OSA (obstructive sleep apnea) 02/24/2019   At risk for adverse drug event 07/06/2018   Non-alcoholic fatty liver disease 01/12/2018   Mild intermittent asthma 01/12/2018   Senile osteoporosis 12/15/2017   Atrial fibrillation with RVR (HCC) 05/19/2017   Migraine headache with aura 02/12/2016   Diabetic neuropathy (HCC) 02/06/2016   Bipolar 1 disorder (HCC) 10/04/2015   Major depressive disorder 10/03/2015   Herpes genitalis in women 07/16/2015   Long term current use of anticoagulant 05/30/2015  Chronic heart failure with preserved ejection fraction (HFpEF) (HCC) 05/30/2015   RLS (restless legs syndrome) 04/27/2015   Insomnia 01/23/2015   Chronic atrial fibrillation with RVR (HCC) 01/04/2015    PCP: [name], MD  REFERRING PROVIDER: Margrette Taft BRAVO, MD   REFERRING DIAG: Gait disturbance, Contusion of right knee, initial encounter, Primary osteoarthritis of right knee   THERAPY DIAG:  Unsteadiness on feet  Muscle weakness (generalized)  Rationale for Evaluation and Treatment: Rehabilitation  ONSET DATE: November 2024  SUBJECTIVE:   SUBJECTIVE  STATEMENT: No reports of recent fall or pain today.  Arrived ambulating with RW.  EVAL: Patient reports that her balance is her primary concern as her right knee has gotten better. She feels that her balance has been getting worse since November 2024. She has tried using a cane and a walker, but she does not feel steady or safe using an assistive device. She is primarily a furniture walker for balance. She has fallen 3-4 times in the past 6 months. She notes that she has had multiple colonoscopies over the previous months due to abdominal pain. She has been having a lot of pain over the past few days and she is scheduled for another colonoscopy in December.   PERTINENT HISTORY: Atrial fibrillation, CHF, diabetic neuropathy, osteoporosis, bipolar 1 disorder, asthma, PTSD, anxiety, and history of cancer PAIN:  Are you having pain? Yes: NPRS scale: 10/10 Pain location: back  Pain description: sharp Aggravating factors: walking Relieving factors: rest and elevation  PRECAUTIONS: Fall  RED FLAGS: None   WEIGHT BEARING RESTRICTIONS: No  FALLS:  Has patient fallen in last 6 months? Yes. Number of falls 3-4  LIVING ENVIRONMENT: Lives with: lives with their spouse Lives in: House/apartment Stairs: Yes: Internal: 1 flight steps; on right going up; patient avoids these steps due to difficulty and instability Has following equipment at home: Quad cane small base, Walker - 4 wheeled, shower chair, and Grab bars  OCCUPATION: retired  PLOF: Independent  PATIENT GOALS: be able to go shopping and improved safety  NEXT MD VISIT: none scheduled  OBJECTIVE:  Note: Objective measures were completed at Evaluation unless otherwise noted.  DIAGNOSTIC FINDINGS: 01/29/24 right knee x-ray IMPRESSION: Moderate prepatellar soft tissue swelling is noted suggesting traumatic injury. No fracture or dislocation is noted.  PATIENT SURVEYS:  ABC scale: The Activities-Specific Balance Confidence (ABC)  Scale 0% 10 20 30  40 50 60 70 80 90 100% No confidence<->completely confident  How confident are you that you will not lose your balance or become unsteady when you . . .   Date tested 03/28/24  Walk around the house 20%  2. Walk up or down stairs 10%  3. Bend over and pick up a slipper from in front of a closet floor 10%  4. Reach for a small can off a shelf at eye level 80%  5. Stand on tip toes and reach for something above your head 40%  6. Stand on a chair and reach for something 0%  7. Sweep the floor 0%  8. Walk outside the house to a car parked in the driveway 50%  9. Get into or out of a car 60%  10. Walk across a parking lot to the mall 0%  11. Walk up or down a ramp 40%  12. Walk in a crowded mall where people rapidly walk past you 10%  13. Are bumped into by people as you walk through the mall 0%  14. Step onto or off  of an escalator while you are holding onto the railing 40%  15. Step onto or off an escalator while holding onto parcels such that you cannot hold onto the railing 0%  16. Walk outside on icy sidewalks 0%  Total: #/16 22.5%      05/12/24: ABC Scale: 450 / 1600 = 28.1 %  COGNITION: Overall cognitive status: Within functional limits for tasks assessed     SENSATION: Light touch: Impaired  and increased sensitivity to light touch in bilateral lower 1/3 of the tibia Patient reports numbness or tingling in her feet and lower legs.   EDEMA:  No edema observed  POSTURE: rounded shoulders, forward head, and flexed trunk   LOWER EXTREMITY ROM: unable to assess secondary to pain   LOWER EXTREMITY MMT: high pain severity and irritability with MMT which limited full assessment   MMT Right eval Left eval  Hip flexion 4-/5 3+/5  Hip extension    Hip abduction    Hip adduction    Hip internal rotation    Hip external rotation    Knee flexion    Knee extension    Ankle dorsiflexion 4-/5 4-/5  Ankle plantarflexion    Ankle inversion    Ankle  eversion     (Blank rows = not tested)  FUNCTIONAL TESTS: will be completed a following appointments, as able 5 times sit to stand: 16 seconds on 04/04/24, BUE push up from arm rests Timed up and go (TUG): 21 seconds on 04/04/24, no AD 2 minute walk test: 194 ft on 04/04/24, no AD, pt SOB 05/12/24: TUG: 39.06 seconds with walker, 17.5 seconds without walker 5TSTS: 11.26 seconds   GAIT: Assistive device utilized: None Level of assistance: SBA Comments: very unsteady with ambulation, required external support from the wall and other objects for safety                                                                                                                                TREATMENT DATE: 05/17/24   - STS no HHA, cueing for upright posture, eccentric lowering - Toe tapping 6in 2x 10, required HHA 1st set - Shoulder extensions/rows, 2 set of 10 reps, GTB at chest level on web slide, pt cued for sequencing and SBA for safety - Resisted walking with ankle weight 3# including monster diagonal walking, marching, lateral and butt kicks - Discussed complaince with HEP, given new handout  05/12/2024  Progress note: Goals tracked, TUG, ABC Scale completed, tennis balls given for sliders on back of walker, educated on importance of getting used to using AD as pt has fallen several times attempting to get around with out one at home. Neuromuscular Re-education: -Shoulder extensions/rows, 2 set of 10 reps, GTB at chest level on web slide, pt cued for sequencing and SBA for safety -Resisted walking with ankle weights, lateral stepping, walking marches/butt kicks, 4 lap each variation on 10 foot line, 3lb ankle weights  Therapeutic Activity: -Walking obstacle course, 2 laps, 20 feet per lap, with 3 small hurdles and 5 cones for weaving pattern, pt cued for upright posture and slight bend in knees -Sit to stands, 2 sets of 7 reps, pt cued for core activation and keeping weight close to  chest  05/04/24 Ambulation with SPC and CGA-minA 200 feet Seated:  LAQ 10X5 (diff with seated balance)  Sit to stands no UE 10X Supine:  bridge 10X  SLR 2 sets 5 reps (AAROM to complete in full ROM and form)  LTR 10X due to discomfort/stiffness Sidelying hip abduction 10X each side with AAROM for form and ROM  05/02/24: STS, no UE use, 10x STS + shoulder flexion to shoulder height, 2 lb bar, 10x STS + UE lift + march, 10x Side stepping, 20 ftx2, CGA  +RTB, around knees, 20 ftx2, CGA Backwards ambulation, RTB, around knees, 20 ftx2 Ambulation with QC, 66 ft --> 33 ft, CGA/min A and verbal cues    PATIENT EDUCATION:  Education details: POC, prognosis, pain, progress with physical therapy, and following up with her referring physician Person educated: Patient Education method: Explanation Education comprehension: verbalized understanding  HOME EXERCISE PROGRAM: Access Code: [email or web address] URL: [email or web address] Date: 04/04/2024 Prepared by: Rosaria Powell-Butler  Exercises - Sit to Stand  - 2 x daily - 7 x weekly - 2 sets - 10 reps - Seated Heel Toe Raises  - 2 x daily - 7 x weekly - 2 sets - 10 reps - Seated Long Arc Quad  - 2 x daily - 7 x weekly - 2 sets - 10 reps - Standing Hip Abduction with Counter Support  - 2 x daily - 7 x weekly - 2 sets - 10 reps  Access Code: [email or web address] URL: [email or web address] Date: 05/04/2024 - Supine Bridge  - 2 x daily - 7 x weekly - 10 reps - Small Range Straight Leg Raise  - 2 x daily - 7 x weekly - 10 reps - Sidelying Hip Abduction  - 2 x daily - 7 x weekly - 10 reps  ASSESSMENT:  CLINICAL IMPRESSION:    Session focus with LE strengthening and balance including postural strengthening with resistance, resistance walking and static balance activities..  Pt required cueing through session to improve awareness of posture.  Pt demonstrated bouts of lethargy through session, required verbal/tactile cueing to open eyes  and sit up tall.  Pt reports she has decreased sleeping quality, tendency to get 2-3 hours of sleep with pain medication for Rt shoulder.  Pt required min to mod A with static and dynamic movements for safety.  No reports of pain through session, easily fatigued.  EOS reviewed compliance with HEP, pt reports minimal compliance and not sure where her printout it.  Given new packet of exercises.    EVAL: Patient is a 78 y.o. female who was seen today for physical therapy evaluation and treatment for unsteadiness on her feet and history of falling. She is a high fall risk as evidenced by her gait mechanics and her history of falling. Her full objective assessment was unable to be completed due to her high pain severity and irritability. She requested to leave early secondary to her pain levels. Recommend that she continue with skilled physical therapy to address her impairments to maximize her safety and functional mobility.    OBJECTIVE IMPAIRMENTS: Abnormal gait, decreased activity tolerance, decreased balance, decreased knowledge of use of DME, decreased mobility, difficulty walking, decreased strength, postural dysfunction, and pain.  ACTIVITY LIMITATIONS: carrying, bending, standing, stairs, transfers, and locomotion level  PARTICIPATION LIMITATIONS: meal prep, cleaning, laundry, shopping, and community activity  PERSONAL FACTORS: Past/current experiences, Time since onset of injury/illness/exacerbation, and 3+ comorbidities: Atrial fibrillation, CHF, diabetic neuropathy, osteoporosis, bipolar 1 disorder, asthma, PTSD, anxiety, and history of cancer are also affecting patient's functional outcome.   REHAB POTENTIAL: Fair    CLINICAL DECISION MAKING: Unstable/unpredictable  EVALUATION COMPLEXITY: High   GOALS: Goals reviewed with patient? Yes  LONG TERM GOALS: Target date: 04/25/24  Patient will be independent with her HEP.  Baseline:  Goal status: MET  2.  Patient will improve her ABC  by at least 10% for improved perceived function with her daily activities.  Baseline:  Goal status: Progressing  3.  Patient will be able to complete the timed up and go in 12 seconds or less to reduce her fall risk.  Baseline:  Goal status: Progressing  4.  Patient will be able to demonstrate proper utilization of the least restrictive assistive device to improve her community mobility.  Baseline: (performs worse on TUG 05/12/24 with RW Goal status: Progressing   PLAN:  PT FREQUENCY: 2x/week  PT DURATION: 4 weeks  PLANNED INTERVENTIONS: 97164- PT Re-evaluation, 97750- Physical Performance Testing, 97110-Therapeutic exercises, 97530- Therapeutic activity, [id number]- Neuromuscular re-education, 97535- Self Care, [id number]- Manual therapy, [phone]- Gait training, Patient/Family education, Balance training, Stair training, Cryotherapy, and Moist heat  PLAN FOR NEXT SESSION: gait training with LRAD, balance interventions, lower extremity strengthening. Follow up if using RW, completing HEP and if she's had any falls.  Extending for 5 weeks, 2 visits per week PN visit 8 (05/12/24).   Augustin Mclean, LPTA/CLT; [facility] [phone]  3:32 PM, 05/17/2024

## 2024-05-17 NOTE — Telephone Encounter (Signed)
 LMOVM to call back. Mychart letter also sent.

## 2024-05-17 NOTE — Patient Instructions (Signed)
 Visit Information  Thank you for taking time to visit with me today. Please don't hesitate to contact me if I can be of assistance to you before our next scheduled appointment.  Your next care management appointment is by telephone on 06-18-2023 at 11:00 AM   Telephone follow-up in 1 month  Please call the care guide team at [phone] if you need to cancel, schedule, or reschedule an appointment.   Please call the Suicide and Crisis Lifeline: 988 call the [place]  National Suicide Prevention Lifeline: 434-336-6132 or TTY: 938-828-7520 TTY 332 844 0710) to talk to a trained counselor call 1-800-273-TALK (toll free, 24 hour hotline) call the [facility]: [phone] call 911 if you are experiencing a Mental Health or Behavioral Health Crisis or need someone to talk to.  Hendricks Her RN, BSN  [place] [facility]-Population Health RN Case Manager   Direct Dial [phone]

## 2024-05-18 ENCOUNTER — Encounter: Attending: Physical Medicine and Rehabilitation | Admitting: Physical Medicine and Rehabilitation

## 2024-05-18 ENCOUNTER — Encounter: Payer: Self-pay | Admitting: Physical Medicine and Rehabilitation

## 2024-05-18 VITALS — BP 140/95 | HR 93 | Ht 60.0 in | Wt 162.6 lb

## 2024-05-18 DIAGNOSIS — G894 Chronic pain syndrome: Secondary | ICD-10-CM | POA: Insufficient documentation

## 2024-05-18 DIAGNOSIS — M62830 Muscle spasm of back: Secondary | ICD-10-CM | POA: Insufficient documentation

## 2024-05-18 DIAGNOSIS — F1120 Opioid dependence, uncomplicated: Secondary | ICD-10-CM | POA: Insufficient documentation

## 2024-05-18 MED ORDER — DIAZEPAM 2 MG PO TABS: 4.0000 mg | ORAL_TABLET | Freq: Every day | ORAL | 0 refills | Status: DC

## 2024-05-18 MED ORDER — OXYCONTIN 20 MG PO T12A: 20.0000 mg | EXTENDED_RELEASE_TABLET | Freq: Two times a day (BID) | ORAL | 0 refills | Status: DC

## 2024-05-18 MED ORDER — KETOROLAC TROMETHAMINE 60 MG/2ML IM SOLN
60.0000 mg | Freq: Once | INTRAMUSCULAR | Status: AC
  Administered 2024-05-18: 12:00:00 60 mg via INTRAMUSCULAR

## 2024-05-18 MED ORDER — METHOCARBAMOL 1000 MG PO TABS: 1000.0000 mg | ORAL_TABLET | Freq: Four times a day (QID) | ORAL | 5 refills | Status: DC | PRN

## 2024-05-18 NOTE — Progress Notes (Signed)
 Subjective:    Patient ID: Amber Stephenson, female    DOB: Jan 25, 1946, 78 y.o.   MRN: [id number]  HPI  Patient is a 78 yr old female with hx of  CAD, pulmonary HTN- and DM- with CKD- Stage III here for right low back pain with sciatica  As well as upper back/neck pain/myofascial pain. Bipolar disorder.  Pt is here for chronic pain f/u  with recent dx of uterine cancer- - undergoing treatment-    Pt is here as f/u for chronic pain.  Also having pain in feet- ankles and B/L 1st/2nd MTPs- has post op changes and arthritis in feet- no evidence of Charcot foot-    Back is really killing her for the past 1 week- but today is esp bad.  Really hurting so bad- -  Neck also hurting  Nothing done differently- that they are aware of Started PT- 3 weeks ago-  About the same- 2x/week- outpt-   Supposed to be doing HEP-   Doing it a little- 1x/week-   Feels like having muscle spasms.  And a sharp pain- in addition- in R low back.  No pain down legs.   Still taking Oxycontin   20 mg BID  Taking Lyrica - 25 mg TID-  Hasn't noticed any difference so far.  Is also taking gabapentin  100 mg TID-   Taking Robaxin  1000 mg  BID- - not QID   Takes Xanax  1 mg at bedtime for sleep- but doesn't sleep well.  Pain Inventory Average Pain 7 Pain Right Now 7 My pain is constant and sharp  In the last 24 hours, has pain interfered with the following? General activity 7 Relation with others 7 Enjoyment of life 7 What TIME of day is your pain at its worst? morning  Sleep (in general) Poor  Pain is worse with: bending and sitting Pain improves with: medication Relief from Meds: 7  Family History  Problem Relation Age of Onset   Diabetes Mother    Heart disease Mother    Alzheimer's disease Father    Heart disease Sister        CABG   Diabetes Sister    Alcohol  abuse Sister    Stroke Brother    Heart disease Brother    Mental illness Brother    Diabetes Brother    Breast cancer Neg Hx     Ovarian cancer Neg Hx    Colon cancer Neg Hx    Endometrial cancer Neg Hx    Social History   Socioeconomic History   Marital status: Married    Spouse name: Financial Planner   Number of children: 3   Years of education: 15   Highest education level: Associate degree: academic program  Occupational History   Occupation: Retired    Comment: chief financial officer  Tobacco Use   Smoking status: Never   Smokeless tobacco: Never  Vaping Use   Vaping status: Never Used  Substance and Sexual Activity   Alcohol  use: Not Currently    Alcohol /week: 0.0 standard drinks of alcohol    Drug use: No   Sexual activity: Not Currently    Partners: Male    Birth control/protection: Post-menopausal  Other Topics Concern   Not on file  Social History Narrative   Lives at home with husband.    They have 3 children who live away - [place] , [place] , and [place] .   She is from [place]  and most of her family lives there.   Her husbands's family live nearby  Right handed.   Social Drivers of Health   Tobacco Use: Low Risk (05/18/2024)   Patient History    Smoking Tobacco Use: Never    Smokeless Tobacco Use: Never    Passive Exposure: Not on file  Financial Resource Strain: Low Risk (08/13/2023)   Overall Financial Resource Strain (CARDIA)    Difficulty of Paying Living Expenses: Not hard at all  Food Insecurity: No Food Insecurity (04/08/2024)   Epic    Worried About Programme Researcher, Broadcasting/film/video in the Last Year: Never true    Ran Out of Food in the Last Year: Never true  Transportation Needs: No Transportation Needs (04/08/2024)   Epic    Lack of Transportation (Medical): No    Lack of Transportation (Non-Medical): No  Physical Activity: Insufficiently Active (08/13/2023)   Exercise Vital Sign    Days of Exercise per Week: 3 days    Minutes of Exercise per Session: 20 min  Stress: No Stress Concern Present (08/13/2023)   [employer] of Occupational [employer] - Occupational Stress Questionnaire    Feeling of  Stress : Only a little  Social Connections: Socially Integrated (02/15/2024)   Social Connection and Isolation Panel    Frequency of Communication with Friends and Family: More than three times a week    Frequency of Social Gatherings with Friends and Family: Once a week    Attends Religious Services: More than 4 times per year    Active Member of Clubs or Organizations: Yes    Attends Banker Meetings: More than 4 times per year    Marital Status: Married  Depression (PHQ2-9): Low Risk (05/17/2024)   Depression (PHQ2-9)    PHQ-2 Score: 0  Alcohol  Screen: Low Risk (08/13/2023)   Alcohol  Screen    Last Alcohol  Screening Score (AUDIT): 0  Housing: Low Risk (04/08/2024)   Epic    Unable to Pay for Housing in the Last Year: No    Number of Times Moved in the Last Year: 0    Homeless in the Last Year: No  Utilities: Not At Risk (04/08/2024)   Epic    Threatened with loss of utilities: No  Health Literacy: Adequate Health Literacy (08/13/2023)   B1300 Health Literacy    Frequency of need for help with medical instructions: Never   Past Surgical History:  Procedure Laterality Date   BREAST REDUCTION SURGERY     COLONOSCOPY  2018   6 mm cecal tubular adenoma, sigmoid diverticulosis, random colon biopsies consistent with microscopic colitis   COLONOSCOPY N/A 01/22/2024   Procedure: COLONOSCOPY;  Surgeon: Eartha Angelia Sieving, MD;  Location: [facility];  Service: Gastroenterology;  Laterality: N/A;   COLONOSCOPY N/A 02/23/2024   Procedure: COLONOSCOPY;  Surgeon: Eartha Angelia Sieving, MD;  Location: [facility];  Service: Gastroenterology;  Laterality: N/A;   ESOPHAGOGASTRODUODENOSCOPY N/A 01/22/2024   Procedure: EGD (ESOPHAGOGASTRODUODENOSCOPY);  Surgeon: Eartha Angelia, Sieving, MD;  Location: [facility];  Service: Gastroenterology;  Laterality: N/A;   ESOPHAGOGASTRODUODENOSCOPY N/A 02/23/2024   Procedure: EGD (ESOPHAGOGASTRODUODENOSCOPY);  Surgeon: Eartha Angelia, Sieving, MD;  Location: [facility];  Service: Gastroenterology;  Laterality: N/A;   EYE SURGERY Right    cateracts   HAMMER TOE SURGERY     LEFT HEART CATH AND CORONARY ANGIOGRAPHY N/A 02/03/2018   Procedure: LEFT HEART CATH AND CORONARY ANGIOGRAPHY;  Surgeon: Jordan, Peter M, MD;  Location: [facility]VAS[facility] CV LAB;  Service: Cardiovascular;  Laterality: N/A;   REVERSE SHOULDER ARTHROPLASTY Right 08/19/2019  Procedure: REVERSE SHOULDER ARTHROPLASTY;  Surgeon: Kay Kemps, MD;  Location: [facility];  Service: Orthopedics;  Laterality: Right;  interscalene block   SHOULDER SURGERY Right    I BROKE MY SHOUDLER   THIGH SURGERY     TO REMOVE A TUMOR    Past Surgical History:  Procedure Laterality Date   BREAST REDUCTION SURGERY     COLONOSCOPY  2018   6 mm cecal tubular adenoma, sigmoid diverticulosis, random colon biopsies consistent with microscopic colitis   COLONOSCOPY N/A 01/22/2024   Procedure: COLONOSCOPY;  Surgeon: Eartha Angelia Sieving, MD;  [facility]: [facility];  Service: Gastroenterology;  Laterality: N/A;   COLONOSCOPY N/A 02/23/2024   Procedure: COLONOSCOPY;  Surgeon: Eartha Angelia Sieving, MD;  [facility];  Service: Gastroenterology;  Laterality: N/A;   ESOPHAGOGASTRODUODENOSCOPY N/A 01/22/2024   Procedure: EGD (ESOPHAGOGASTRODUODENOSCOPY);  Surgeon: Eartha Angelia, Sieving, MD;  [facility]: [facility];  Service: Gastroenterology;  Laterality: N/A;   ESOPHAGOGASTRODUODENOSCOPY N/A 02/23/2024   Procedure: EGD (ESOPHAGOGASTRODUODENOSCOPY);  Surgeon: Eartha Angelia, Sieving, MD;  Location: [facility];  Service: Gastroenterology;  Laterality: N/A;   EYE SURGERY Right    cateracts   HAMMER TOE SURGERY     LEFT HEART CATH AND CORONARY ANGIOGRAPHY N/A 02/03/2018   Procedure: LEFT HEART CATH AND CORONARY ANGIOGRAPHY;  Surgeon: Jordan, Peter M, MD;  Location: [facility] INVASIVE CV LAB;  Service: Cardiovascular;  Laterality: N/A;   REVERSE SHOULDER  ARTHROPLASTY Right 08/19/2019   Procedure: REVERSE SHOULDER ARTHROPLASTY;  Surgeon: Kay Kemps, MD;  Location: [facility];  Service: Orthopedics;  Laterality: Right;  interscalene block   SHOULDER SURGERY Right    I BROKE MY SHOUDLER   THIGH SURGERY     TO REMOVE A TUMOR    Past Medical History:  Diagnosis Date   Allergic rhinitis 02/24/2019   Ambulatory dysfunction 04/23/2020   Atrial fibrillation with RVR (HCC) 05/19/2017   Back pain/sacroiliitis--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   Benign paroxysmal positional vertigo due to bilateral vestibular disorder 05/20/2019   Bipolar 1 disorder (HCC) 01/23/2015   with GAD, benzo dependence   CAD (coronary artery disease)    Nonobstructive; Managed by Dr. Charls   Cardiomegaly 01/12/2018   CHF (congestive heart failure) 06/08/2022   Chronic back pain 01/04/2015   Chronic constipation 04/25/2020   Chronic diastolic heart failure (HCC) 05/30/2015   Chronic pain syndrome 08/22/2019   back pain, sacroiliitis   Chronic post-traumatic stress disorder (PTSD) 12/06/2020   Diabetic neuropathy (HCC) 02/06/2016   Dyslipidemia 09/24/2020   Essential hypertension    Functional diarrhea 10/26/2020   Herpes genitalis in women 07/16/2015   History of adenomatous polyp of colon 05/21/2019   Overview:   03/31/17: Colonoscopy: nonadvanced adenoma, microscopic colitis, f/u 5 yrs, [facility]/GAP   History of radiation therapy    Endometrial- 02/18/23-03/31/23-Dr. Lynwood Kinard   Hypotension 09/01/2022   Insomnia 01/23/2015   Metabolic acidosis 01/12/2023   Migraine headache with aura 02/12/2016   Myofascial pain dysfunction syndrome 08/22/2019   Non-alcoholic fatty liver disease 01/12/2018   OSA (obstructive sleep apnea) 02/24/2019   10/09/2018 - HST  - AHI 40.6    Osteopenia 12/06/2020   Rx alendronate  35 mg.   Pinguecula 12/06/2020   Presbyopia  12/06/2020   Pulmonary hypertension    RLS (restless legs syndrome) 04/27/2015   RSV (respiratory syncytial virus infection) 06/10/2023   Tremor, essential 12/11/2021  Type II diabetes mellitus (HCC) 09/24/2020   BP (!) 140/95   Pulse 93   Ht 5' (1.524 m)   Wt 162 lb 9.6 oz (73.8 kg)   SpO2 98%   BMI 31.76 kg/m   Opioid Risk Score:   Fall Risk Score:  `1  Depression screen [facility] 2/9     05/17/2024   11:35 AM 04/08/2024   10:36 AM 03/15/2024    3:31 PM 03/11/2024    1:46 PM 02/26/2024   11:28 AM 02/15/2024    9:47 AM 02/09/2024    2:46 PM  Depression screen PHQ 2/9  Decreased Interest 0 0 0 0 0 0 0  Down, Depressed, Hopeless 0 0 0 0 0 0   PHQ - 2 Score 0 0 0 0 0 0 0  Difficult doing work/chores      Not difficult at all       Review of Systems  Musculoskeletal:  Positive for back pain and gait problem.  All other systems reviewed and are negative.      Objective:   Physical Exam  Groaning at breast, but really badly with standing up- took 2x to get to standing with husbands help Extremely TTP in R low back - all paraspinals and feels like Trp's so tight      Assessment & Plan:    Patient is a 78 yr old female with hx of  CAD, pulmonary HTN- and DM- with CKD- Stage III here for right low back pain with sciatica  As well as upper back/neck pain/myofascial pain. Bipolar disorder.  Pt is here for chronic pain f/u  with recent dx of uterine cancer- - undergoing treatment-    Pt is here as f/u for chronic pain.  Also having pain in feet- ankles and B/L 1st/2nd MTPs- has post op changes and arthritis in feet- no evidence of Charcot foot-   Would increase Robaxin  to 1000 mg 4x/day for muscle spasms- but only take 2x/day when pain is at baseline-  and then only increase after finishes Valium -   2.    Will try Valium /Diazepam -  x 3 days at most- just to break pain cycle-  4 mg daily x 3 days-    3.We don't want to keep increasing  pain meds overall- we want   4.  Toradol  60 mg IM x 1 to try and help her break pain cycle-   5.  Oxycontin - BRAND 0dispense as written, -   20 mg 2x/day- for chronic pain- - will write for 2 Rx's-  Called pharmacy- and was on hold for 15+ minutes-  -  6. F/U in 2 months- Eunice in 2months and 4 months with  me-    I spent a total of 34    minutes on total care today- >50% coordination of care- due to being on phone with Pharmacy to iron out the pain med issue- and getting her Toradol - from nursing-

## 2024-05-18 NOTE — Patient Instructions (Signed)
°  Patient is a 78 yr old female with hx of  CAD, pulmonary HTN- and DM- with CKD- Stage III here for right low back pain with sciatica  As well as upper back/neck pain/myofascial pain. Bipolar disorder.  Pt is here for chronic pain f/u  with recent dx of uterine cancer- - undergoing treatment-    Pt is here as f/u for chronic pain.  Also having pain in feet- ankles and B/L 1st/2nd MTPs- has post op changes and arthritis in feet- no evidence of Charcot foot-   Would increase Robaxin  to 1000 mg 4x/day for muscle spasms- but only take 2x/day when pain is at baseline-  and then only increase after finishes Valium -   2.    Will try Valium /Diazepam -  x 3 days at most- just to break pain cycle-  4 mg daily x 3 days-    3.We don't want to keep increasing  pain meds overall- we want   4. Toradol  60 mg IM x 1 to try and help her break pain cycle-   5.  Oxycontin - generic-   20 mg 2x/day- for chronic pain- - will write for 2 Rx's-  Called pharmacy- and was on hold for 15+ minutes-  -  6. F/U in 2 months- Eunice in 2months and 4 months with  me-

## 2024-05-18 NOTE — Addendum Note (Signed)
 Addended by: Nikola Marone W on: 05/18/2024 04:19 PM   Modules accepted: Orders

## 2024-05-19 ENCOUNTER — Encounter (HOSPITAL_COMMUNITY): Payer: Self-pay

## 2024-05-19 ENCOUNTER — Ambulatory Visit (HOSPITAL_COMMUNITY)

## 2024-05-19 DIAGNOSIS — M6281 Muscle weakness (generalized): Secondary | ICD-10-CM

## 2024-05-19 DIAGNOSIS — R2681 Unsteadiness on feet: Secondary | ICD-10-CM

## 2024-05-19 NOTE — Therapy (Signed)
 OUTPATIENT PHYSICAL THERAPY LOWER EXTREMITY TREATMENT  Patient Name: Amber Stephenson MRN: [id number] DOB:[birth year], 78 y.o., female Today's Date: 05/19/2024  END OF SESSION:  PT End of Session - 05/19/24 1330     Visit Number 10    Number of Visits 18    Date for Recertification  05/27/24    Authorization Type Medicare part A and B, with [employer] for life secondary    Authorization Time Period no authorization required    Progress Note Due on Visit 18    PT Start Time 1334    PT Stop Time 1415    PT Time Calculation (min) 41 min    Equipment Utilized During Treatment Gait belt    Activity Tolerance Patient tolerated treatment well    Behavior During Therapy WFL for tasks assessed/performed              Past Medical History:  Diagnosis Date   Allergic rhinitis 02/24/2019   Ambulatory dysfunction 04/23/2020   Atrial fibrillation with RVR (HCC) 05/19/2017   Back pain/sacroiliitis--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   Benign paroxysmal positional vertigo due to bilateral vestibular disorder 05/20/2019   Bipolar 1 disorder (HCC) 01/23/2015   with GAD, benzo dependence   CAD (coronary artery disease)    Nonobstructive; Managed by Dr. Charls   Cardiomegaly 01/12/2018   CHF (congestive heart failure) 06/08/2022   Chronic back pain 01/04/2015   Chronic constipation 04/25/2020   Chronic diastolic heart failure (HCC) 05/30/2015   Chronic pain syndrome 08/22/2019   back pain, sacroiliitis   Chronic post-traumatic stress disorder (PTSD) 12/06/2020   Diabetic neuropathy (HCC) 02/06/2016   Dyslipidemia 09/24/2020   Essential hypertension    Functional diarrhea 10/26/2020   Herpes genitalis in women 07/16/2015   History of adenomatous polyp of colon 05/21/2019   Overview:   03/31/17: Colonoscopy: nonadvanced adenoma, microscopic colitis, f/u 5 yrs, [facility]/GAP   History of  radiation therapy    Endometrial- 02/18/23-03/31/23-Dr. Lynwood Kinard   Hypotension 09/01/2022   Insomnia 01/23/2015   Metabolic acidosis 01/12/2023   Migraine headache with aura 02/12/2016   Myofascial pain dysfunction syndrome 08/22/2019   Non-alcoholic fatty liver disease 01/12/2018   OSA (obstructive sleep apnea) 02/24/2019   10/09/2018 - HST  - AHI 40.6    Osteopenia 12/06/2020   Rx alendronate  35 mg.   Pinguecula 12/06/2020   Presbyopia 12/06/2020   Pulmonary hypertension    RLS (restless legs syndrome) 04/27/2015   RSV (respiratory syncytial virus infection) 06/10/2023   Tremor, essential 12/11/2021   Type II diabetes mellitus (HCC) 09/24/2020   Past Surgical History:  Procedure Laterality Date   BREAST REDUCTION SURGERY     COLONOSCOPY  2018   6 mm cecal tubular adenoma, sigmoid diverticulosis, random colon biopsies consistent with microscopic colitis   COLONOSCOPY N/A 01/22/2024   Procedure: COLONOSCOPY;  Surgeon: Eartha Angelia Sieving, MD;  Location: [facility];  Service: Gastroenterology;  Laterality: N/A;   COLONOSCOPY N/A 02/23/2024   Procedure: COLONOSCOPY;  Surgeon: Eartha Angelia Sieving, MD;  Location: [facility];  Service: Gastroenterology;  Laterality: N/A;   ESOPHAGOGASTRODUODENOSCOPY N/A 01/22/2024   Procedure: EGD (ESOPHAGOGASTRODUODENOSCOPY);  Surgeon: Eartha Angelia, Sieving, MD;  Location: [facility];  Service: Gastroenterology;  Laterality: N/A;   ESOPHAGOGASTRODUODENOSCOPY N/A 02/23/2024   Procedure: EGD (ESOPHAGOGASTRODUODENOSCOPY);  Surgeon: Eartha Angelia, Sieving, MD;  Location: [facility];  Service: Gastroenterology;  Laterality: N/A;  EYE SURGERY Right    cateracts   HAMMER TOE SURGERY     LEFT HEART CATH AND CORONARY ANGIOGRAPHY N/A 02/03/2018   Procedure: LEFT HEART CATH AND CORONARY ANGIOGRAPHY;  Surgeon: Jordan, Peter M, MD;  Location: [facility] IN[facility] CV [facility];  Service: Cardiovascular;  Laterality: N/A;   REVERSE SHOULDER  ARTHROPLASTY Right 08/19/2019   Procedure: REVERSE SHOULDER ARTHROPLASTY;  Surgeon: Kay Kemps, MD;  Location: [facility];  Service: Orthopedics;  Laterality: Right;  interscalene block   SHOULDER SURGERY Right    I BROKE MY SHOUDLER   THIGH SURGERY     TO REMOVE A TUMOR    Patient Active Problem List   Diagnosis Date Noted   Muscle spasm of back 05/18/2024   Radiation induced neuropathy 03/11/2024   Paroxysmal nerve pain 03/11/2024   RLQ abdominal pain 02/17/2024   Acute on chronic diastolic heart failure (HCC) 02/15/2024   Arcus senilis 02/04/2024   Benign neoplasm of skin 02/04/2024   Depressed bipolar I disorder in full remission 02/04/2024   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   Osteopenia 02/04/2024   Pain of foot 02/04/2024   Pain of right breast 02/04/2024   Phase of life problem 02/04/2024   Acute gastric ulcer without hemorrhage or perforation 01/23/2024   Hematochezia 01/21/2024   Acute anemia 01/21/2024   ABLA (acute blood loss anemia) 01/19/2024   Essential hypertension 01/07/2024   Rectal bleeding 10/07/2023   Nocturnal enuresis 08/25/2023   Hyponatremia 06/10/2023   Abdominal pain 06/10/2023   Diarrhea 06/10/2023   Candidiasis of mouth 05/15/2023   Type 2 diabetes mellitus with hyperglycemia, with long-term current use of insulin  (HCC) 02/12/2023   Long term (current) use of oral hypoglycemic drugs 02/12/2023   Primary hypertension 01/14/2023   Endometrial cancer (HCC) 01/12/2023   Vulvar itching 12/22/2022   Burning with urination 11/13/2022   Vitamin B12 deficiency 11/09/2022   Frequent falls 09/01/2022   AKI (acute kidney injury) 06/27/2022   Hemorrhoids 03/11/2022   Microalbuminuria 01/21/2022   Tremor, essential 12/11/2021   Chronic bilateral low back pain with right-sided sciatica 07/19/2021   Vaginal itching 04/16/2021   Sensory ataxia 03/28/2021   Elevated troponin 12/14/2020   Chronic post-traumatic stress disorder (PTSD)  12/06/2020   Senile corneal changes 12/06/2020   Postmenopausal bleeding 12/06/2020   Acquired hammer toe 12/06/2020   Opioid dependence 12/06/2020   Hypermetropia 12/06/2020   Generalized weakness 12/06/2020   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   Generalized anxiety disorder 12/06/2020   Functional diarrhea 10/26/2020   Insulin  dependent type 2 diabetes mellitus (HCC) 09/24/2020   Chronic respiratory failure with hypoxia (HCC) 04/25/2020   Chronic constipation 04/25/2020   Chronic pain syndrome 08/22/2019   CAD (coronary artery disease)    Hypocalcemia    S/P shoulder replacement, 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   Mixed hyperlipidemia 05/20/2019   Vertigo 04/25/2019   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   OSA (obstructive sleep apnea) 02/24/2019   At risk for adverse drug event 07/06/2018   Non-alcoholic fatty liver disease 01/12/2018   Mild intermittent asthma 01/12/2018   Senile osteoporosis 12/15/2017   Atrial fibrillation with RVR (HCC) 05/19/2017   Migraine headache with aura 02/12/2016   Diabetic neuropathy (HCC) 02/06/2016   Bipolar 1 disorder (HCC) 10/04/2015   Major depressive disorder 10/03/2015   Herpes genitalis in women 07/16/2015   Long term current use of anticoagulant 05/30/2015   Chronic heart failure with preserved ejection fraction (HFpEF) (HCC) 05/30/2015   RLS (restless legs syndrome) 04/27/2015   Insomnia  01/23/2015   Chronic atrial fibrillation with RVR (HCC) 01/04/2015    PCP: [name], MD  REFERRING PROVIDER: Margrette Taft BRAVO, MD   REFERRING DIAG: Gait disturbance, Contusion of right knee, initial encounter, Primary osteoarthritis of right knee   THERAPY DIAG:  Unsteadiness on feet  Muscle weakness (generalized)  Rationale for Evaluation and Treatment: Rehabilitation  ONSET DATE: November 2024  SUBJECTIVE:   SUBJECTIVE STATEMENT: Reports improved sleep the last 2 nights 5-6 hours.  Received shot in Lt hip yesterday.  Arrived  with reports of LBP, pain scale 7/10.  Has began the HEP without questions.   EVAL: Patient reports that her balance is her primary concern as her right knee has gotten better. She feels that her balance has been getting worse since November 2024. She has tried using a cane and a walker, but she does not feel steady or safe using an assistive device. She is primarily a furniture walker for balance. She has fallen 3-4 times in the past 6 months. She notes that she has had multiple colonoscopies over the previous months due to abdominal pain. She has been having a lot of pain over the past few days and she is scheduled for another colonoscopy in December.   PERTINENT HISTORY: Atrial fibrillation, CHF, diabetic neuropathy, osteoporosis, bipolar 1 disorder, asthma, PTSD, anxiety, and history of cancer PAIN:  Are you having pain? Yes: NPRS scale: 10/10 Pain location: back  Pain description: sharp Aggravating factors: walking Relieving factors: rest and elevation  PRECAUTIONS: Fall  RED FLAGS: None   WEIGHT BEARING RESTRICTIONS: No  FALLS:  Has patient fallen in last 6 months? Yes. Number of falls 3-4  LIVING ENVIRONMENT: Lives with: lives with their spouse Lives in: House/apartment Stairs: Yes: Internal: 1 flight steps; on right going up; patient avoids these steps due to difficulty and instability Has following equipment at home: Quad cane small base, Walker - 4 wheeled, shower chair, and Grab bars  OCCUPATION: retired  PLOF: Independent  PATIENT GOALS: be able to go shopping and improved safety  NEXT MD VISIT: none scheduled  OBJECTIVE:  Note: Objective measures were completed at Evaluation unless otherwise noted.  DIAGNOSTIC FINDINGS: 01/29/24 right knee x-ray IMPRESSION: Moderate prepatellar soft tissue swelling is noted suggesting traumatic injury. No fracture or dislocation is noted.  PATIENT SURVEYS:  ABC scale: The Activities-Specific Balance Confidence (ABC) Scale 0%  10 20 30  40 50 60 70 80 90 100% No confidence<->completely confident  How confident are you that you will not lose your balance or become unsteady when you . . .   Date tested 03/28/24  Walk around the house 20%  2. Walk up or down stairs 10%  3. Bend over and pick up a slipper from in front of a closet floor 10%  4. Reach for a small can off a shelf at eye level 80%  5. Stand on tip toes and reach for something above your head 40%  6. Stand on a chair and reach for something 0%  7. Sweep the floor 0%  8. Walk outside the house to a car parked in the driveway 50%  9. Get into or out of a car 60%  10. Walk across a parking lot to the mall 0%  11. Walk up or down a ramp 40%  12. Walk in a crowded mall where people rapidly walk past you 10%  13. Are bumped into by people as you walk through the mall 0%  14. Step onto or off  of an escalator while you are holding onto the railing 40%  15. Step onto or off an escalator while holding onto parcels such that you cannot hold onto the railing 0%  16. Walk outside on icy sidewalks 0%  Total: #/16 22.5%      05/12/24: ABC Scale: 450 / 1600 = 28.1 %  COGNITION: Overall cognitive status: Within functional limits for tasks assessed     SENSATION: Light touch: Impaired  and increased sensitivity to light touch in bilateral lower 1/3 of the tibia Patient reports numbness or tingling in her feet and lower legs.   EDEMA:  No edema observed  POSTURE: rounded shoulders, forward head, and flexed trunk   LOWER EXTREMITY ROM: unable to assess secondary to pain   LOWER EXTREMITY MMT: high pain severity and irritability with MMT which limited full assessment   MMT Right eval Left eval  Hip flexion 4-/5 3+/5  Hip extension    Hip abduction    Hip adduction    Hip internal rotation    Hip external rotation    Knee flexion    Knee extension    Ankle dorsiflexion 4-/5 4-/5  Ankle plantarflexion    Ankle inversion    Ankle eversion      (Blank rows = not tested)  FUNCTIONAL TESTS: will be completed a following appointments, as able 5 times sit to stand: 16 seconds on 04/04/24, BUE push up from arm rests Timed up and go (TUG): 21 seconds on 04/04/24, no AD 2 minute walk test: 194 ft on 04/04/24, no AD, pt SOB 05/12/24: TUG: 39.06 seconds with walker, 17.5 seconds without walker 5TSTS: 11.26 seconds   GAIT: Assistive device utilized: None Level of assistance: SBA Comments: very unsteady with ambulation, required external support from the wall and other objects for safety                                                                                                                                TREATMENT DATE: 05/19/24: - STS no HHA, cueing for upright posture, eccentric lowering, increased fatigue rep 8, 10 complete - Toe tapping 6in 2x 10, required HHA 1st set - Shoulder extensions/rows, 2 set of 10 reps, GTB at chest level on web slide, pt cued for sequencing and SBA for safety - Step up and over 4in fatigue at rep 8 with 2 HHA - Resisted walking with ankle weight 4# including monster diagonal walking, marching, lateral and butt kicks  05/17/24   - STS no HHA, cueing for upright posture, eccentric lowering - Toe tapping 6in 2x 10, required HHA 1st set - Shoulder extensions/rows, 2 set of 10 reps, GTB at chest level on web slide, pt cued for sequencing and SBA for safety - Resisted walking with ankle weight 3# including monster diagonal walking, marching, lateral and butt kicks - Discussed complaince with HEP, given new handout  05/12/2024  Progress note: Goals tracked, TUG, ABC Scale  completed, tennis balls given for sliders on back of walker, educated on importance of getting used to using AD as pt has fallen several times attempting to get around with out one at home. Neuromuscular Re-education: -Shoulder extensions/rows, 2 set of 10 reps, GTB at chest level on web slide, pt cued for sequencing and SBA for  safety -Resisted walking with ankle weights, lateral stepping, walking marches/butt kicks, 4 lap each variation on 10 foot line, 3lb ankle weights  Therapeutic Activity: -Walking obstacle course, 2 laps, 20 feet per lap, with 3 small hurdles and 5 cones for weaving pattern, pt cued for upright posture and slight bend in knees -Sit to stands, 2 sets of 7 reps, pt cued for core activation and keeping weight close to chest  05/04/24 Ambulation with SPC and CGA-minA 200 feet Seated:  LAQ 10X5 (diff with seated balance)  Sit to stands no UE 10X Supine:  bridge 10X  SLR 2 sets 5 reps (AAROM to complete in full ROM and form)  LTR 10X due to discomfort/stiffness Sidelying hip abduction 10X each side with AAROM for form and ROM  05/02/24: STS, no UE use, 10x STS + shoulder flexion to shoulder height, 2 lb bar, 10x STS + UE lift + march, 10x Side stepping, 20 ftx2, CGA  +RTB, around knees, 20 ftx2, CGA Backwards ambulation, RTB, around knees, 20 ftx2 Ambulation with QC, 66 ft --> 33 ft, CGA/min A and verbal cues    PATIENT EDUCATION:  Education details: POC, prognosis, pain, progress with physical therapy, and following up with her referring physician Person educated: Patient Education method: Explanation Education comprehension: verbalized understanding  HOME EXERCISE PROGRAM: Access Code: [email or web address] URL: [email or web address] Date: 04/04/2024 Prepared by: Rosaria Powell-Butler  Exercises - Sit to Stand  - 2 x daily - 7 x weekly - 2 sets - 10 reps - Seated Heel Toe Raises  - 2 x daily - 7 x weekly - 2 sets - 10 reps - Seated Long Arc Quad  - 2 x daily - 7 x weekly - 2 sets - 10 reps - Standing Hip Abduction with Counter Support  - 2 x daily - 7 x weekly - 2 sets - 10 reps  Access Code: [email or web address] URL: [email or web address] Date: 05/04/2024 - Supine Bridge  - 2 x daily - 7 x weekly - 10 reps - Small Range Straight Leg Raise  - 2 x daily - 7 x weekly - 10  reps - Sidelying Hip Abduction  - 2 x daily - 7 x weekly - 10 reps  ASSESSMENT:  CLINICAL IMPRESSION:  Pt more alert this session, no cueing required to keep eyes open, pt reports improved strength the last 2 days.  Session focus with LE strengtening and balance training.  Progressed functional strengthening with additional step up training, cueing to reduce UE pulling up and utilized muscualture for strengthening, increased difficulty with Lt LE compared to Rt with activity.  Pt does continue to fatigue quickly, requires frequent seated rest breaks between activity.  Balance activities required intermittent UE aide for safety.  No reports of pain through session.    EVAL: Patient is a 78 y.o. female who was seen today for physical therapy evaluation and treatment for unsteadiness on her feet and history of falling. She is a high fall risk as evidenced by her gait mechanics and her history of falling. Her full objective assessment was unable to be completed due to her high pain severity and irritability. She requested to leave early secondary  to her pain levels. Recommend that she continue with skilled physical therapy to address her impairments to maximize her safety and functional mobility.    OBJECTIVE IMPAIRMENTS: Abnormal gait, decreased activity tolerance, decreased balance, decreased knowledge of use of DME, decreased mobility, difficulty walking, decreased strength, postural dysfunction, and pain.   ACTIVITY LIMITATIONS: carrying, bending, standing, stairs, transfers, and locomotion level  PARTICIPATION LIMITATIONS: meal prep, cleaning, laundry, shopping, and community activity  PERSONAL FACTORS: Past/current experiences, Time since onset of injury/illness/exacerbation, and 3+ comorbidities: Atrial fibrillation, CHF, diabetic neuropathy, osteoporosis, bipolar 1 disorder, asthma, PTSD, anxiety, and history of cancer are also affecting patient's functional outcome.   REHAB POTENTIAL: Fair     CLINICAL DECISION MAKING: Unstable/unpredictable  EVALUATION COMPLEXITY: High   GOALS: Goals reviewed with patient? Yes  LONG TERM GOALS: Target date: 04/25/24  Patient will be independent with her HEP.  Baseline:  Goal status: MET  2.  Patient will improve her ABC by at least 10% for improved perceived function with her daily activities.  Baseline:  Goal status: Progressing  3.  Patient will be able to complete the timed up and go in 12 seconds or less to reduce her fall risk.  Baseline:  Goal status: Progressing  4.  Patient will be able to demonstrate proper utilization of the least restrictive assistive device to improve her community mobility.  Baseline: (performs worse on TUG 05/12/24 with RW Goal status: Progressing   PLAN:  PT FREQUENCY: 2x/week  PT DURATION: 4 weeks  PLANNED INTERVENTIONS: 97164- PT Re-evaluation, 97750- Physical Performance Testing, 97110-Therapeutic exercises, 97530- Therapeutic activity, [id number]- Neuromuscular re-education, 97535- Self Care, [id number]- Manual therapy, [phone]- Gait training, Patient/Family education, Balance training, Stair training, Cryotherapy, and Moist heat  PLAN FOR NEXT SESSION: gait training with LRAD, balance interventions, lower extremity strengthening. Follow up if using RW, completing HEP and if she's had any falls.  Extending for 5 weeks, 2 visits per week PN visit 8 (05/12/24).  Discussed walking program for activity tolerance with RW.   Augustin Mclean, LPTA/CLT; WILLAIM [phone]  5:26 PM, 05/19/2024

## 2024-05-23 ENCOUNTER — Emergency Department (HOSPITAL_BASED_OUTPATIENT_CLINIC_OR_DEPARTMENT_OTHER)

## 2024-05-23 ENCOUNTER — Ambulatory Visit: Payer: Self-pay

## 2024-05-23 ENCOUNTER — Emergency Department (HOSPITAL_BASED_OUTPATIENT_CLINIC_OR_DEPARTMENT_OTHER)
Admission: EM | Admit: 2024-05-23 | Discharge: 2024-05-23 | Disposition: A | Discharge status: Home / Self Care | Attending: Emergency Medicine | Admitting: Emergency Medicine

## 2024-05-23 ENCOUNTER — Other Ambulatory Visit: Payer: Self-pay

## 2024-05-23 DIAGNOSIS — Z794 Long term (current) use of insulin: Secondary | ICD-10-CM | POA: Insufficient documentation

## 2024-05-23 DIAGNOSIS — S0003XA Contusion of scalp, initial encounter: Secondary | ICD-10-CM | POA: Diagnosis not present

## 2024-05-23 DIAGNOSIS — S7011XA Contusion of right thigh, initial encounter: Secondary | ICD-10-CM | POA: Diagnosis not present

## 2024-05-23 DIAGNOSIS — S40011A Contusion of right shoulder, initial encounter: Secondary | ICD-10-CM | POA: Insufficient documentation

## 2024-05-23 DIAGNOSIS — K5730 Diverticulosis of large intestine without perforation or abscess without bleeding: Secondary | ICD-10-CM | POA: Diagnosis not present

## 2024-05-23 DIAGNOSIS — S8001XA Contusion of right knee, initial encounter: Secondary | ICD-10-CM | POA: Insufficient documentation

## 2024-05-23 DIAGNOSIS — Z79899 Other long term (current) drug therapy: Secondary | ICD-10-CM | POA: Insufficient documentation

## 2024-05-23 DIAGNOSIS — Z91040 Latex allergy status: Secondary | ICD-10-CM | POA: Diagnosis not present

## 2024-05-23 DIAGNOSIS — I4891 Unspecified atrial fibrillation: Secondary | ICD-10-CM | POA: Diagnosis not present

## 2024-05-23 DIAGNOSIS — S7001XA Contusion of right hip, initial encounter: Secondary | ICD-10-CM | POA: Diagnosis not present

## 2024-05-23 DIAGNOSIS — S0990XA Unspecified injury of head, initial encounter: Secondary | ICD-10-CM | POA: Diagnosis present

## 2024-05-23 DIAGNOSIS — I2510 Atherosclerotic heart disease of native coronary artery without angina pectoris: Secondary | ICD-10-CM | POA: Insufficient documentation

## 2024-05-23 DIAGNOSIS — I5030 Unspecified diastolic (congestive) heart failure: Secondary | ICD-10-CM | POA: Insufficient documentation

## 2024-05-23 DIAGNOSIS — W19XXXA Unspecified fall, initial encounter: Secondary | ICD-10-CM

## 2024-05-23 DIAGNOSIS — W01198A Fall on same level from slipping, tripping and stumbling with subsequent striking against other object, initial encounter: Secondary | ICD-10-CM | POA: Insufficient documentation

## 2024-05-23 DIAGNOSIS — I700 Atherosclerosis of aorta: Secondary | ICD-10-CM | POA: Insufficient documentation

## 2024-05-23 DIAGNOSIS — E119 Type 2 diabetes mellitus without complications: Secondary | ICD-10-CM | POA: Diagnosis not present

## 2024-05-23 DIAGNOSIS — Z7901 Long term (current) use of anticoagulants: Secondary | ICD-10-CM | POA: Insufficient documentation

## 2024-05-23 DIAGNOSIS — D649 Anemia, unspecified: Secondary | ICD-10-CM | POA: Diagnosis not present

## 2024-05-23 DIAGNOSIS — I110 Hypertensive heart disease with heart failure: Secondary | ICD-10-CM | POA: Insufficient documentation

## 2024-05-23 LAB — COMPREHENSIVE METABOLIC PANEL WITH GFR
ALT: 12 U/L (ref 0–44)
AST: 22 U/L (ref 15–41)
Albumin: 3.6 g/dL (ref 3.5–5.0)
Alkaline Phosphatase: 113 U/L (ref 38–126)
Anion gap: 8 (ref 5–15)
BUN: 11 mg/dL (ref 8–23)
CO2: 29 mmol/L (ref 22–32)
Calcium: 9.4 mg/dL (ref 8.9–10.3)
Chloride: 102 mmol/L (ref 98–111)
Creatinine, Ser: 0.6 mg/dL (ref 0.44–1.00)
GFR, Estimated: >60 mL/min
Glucose, Bld: 121 mg/dL — ABNORMAL HIGH (ref 70–99)
Potassium: 3.5 mmol/L (ref 3.5–5.1)
Sodium: 139 mmol/L (ref 135–145)
Total Bilirubin: 0.5 mg/dL (ref 0.0–1.2)
Total Protein: 6.9 g/dL (ref 6.5–8.1)

## 2024-05-23 LAB — CBC WITH DIFFERENTIAL/PLATELET
Abs Immature Granulocytes: 0.02 K/uL (ref 0.00–0.07)
Basophils Absolute: 0 K/uL (ref 0.0–0.1)
Basophils Relative: 0 %
Eosinophils Absolute: 0.2 K/uL (ref 0.0–0.5)
Eosinophils Relative: 3 %
HCT: 29.7 % — ABNORMAL LOW (ref 36.0–46.0)
Hemoglobin: 9.9 g/dL — ABNORMAL LOW (ref 12.0–15.0)
Immature Granulocytes: 0 %
Lymphocytes Relative: 13 %
Lymphs Abs: 0.7 K/uL (ref 0.7–4.0)
MCH: 29.5 pg (ref 26.0–34.0)
MCHC: 33.3 g/dL (ref 30.0–36.0)
MCV: 88.4 fL (ref 80.0–100.0)
Monocytes Absolute: 0.6 K/uL (ref 0.1–1.0)
Monocytes Relative: 10 %
Neutro Abs: 4.2 K/uL (ref 1.7–7.7)
Neutrophils Relative %: 74 %
Platelets: 161 K/uL (ref 150–400)
RBC: 3.36 MIL/uL — ABNORMAL LOW (ref 3.87–5.11)
RDW: 15.1 % (ref 11.5–15.5)
WBC: 5.7 K/uL (ref 4.0–10.5)
nRBC: 0 % (ref 0.0–0.2)

## 2024-05-23 LAB — TROPONIN T, HIGH SENSITIVITY: Troponin T High Sensitivity: 19 ng/L (ref 0–19)

## 2024-05-23 MED ORDER — DIPHENHYDRAMINE HCL 50 MG/ML IJ SOLN
50.0000 mg | Freq: Once | INTRAMUSCULAR | Status: AC
  Administered 2024-05-23: 50 mg via INTRAVENOUS
  Filled 2024-05-23: qty 1

## 2024-05-23 MED ORDER — METHYLPREDNISOLONE SODIUM SUCC 40 MG IJ SOLR
40.0000 mg | Freq: Once | INTRAMUSCULAR | Status: AC
  Administered 2024-05-23: 40 mg via INTRAVENOUS
  Filled 2024-05-23: qty 1

## 2024-05-23 MED ORDER — FENTANYL CITRATE (PF) 50 MCG/ML IJ SOSY
50.0000 ug | PREFILLED_SYRINGE | Freq: Once | INTRAMUSCULAR | Status: AC
  Administered 2024-05-23: 50 ug via INTRAVENOUS
  Filled 2024-05-23: qty 1

## 2024-05-23 MED ORDER — DIPHENHYDRAMINE HCL 25 MG PO CAPS: 50.0000 mg | ORAL_CAPSULE | Freq: Once | ORAL | Status: AC

## 2024-05-23 MED ORDER — IOHEXOL 300 MG/ML  SOLN
100.0000 mL | Freq: Once | INTRAMUSCULAR | Status: AC | PRN
  Administered 2024-05-23: 100 mL via INTRAVENOUS

## 2024-05-23 MED ORDER — HYDROMORPHONE HCL 1 MG/ML IJ SOLN
1.0000 mg | Freq: Once | INTRAMUSCULAR | Status: AC
  Administered 2024-05-23: 1 mg via INTRAVENOUS
  Filled 2024-05-23: qty 1

## 2024-05-23 NOTE — Telephone Encounter (Signed)
 FYI Only or Action Required?: FYI only for provider: ED advised.  Patient was last seen in primary care on 04/19/2024 by Amber Stephenson LABOR, FNP.  Called Nurse Triage reporting Fall.  Symptoms began yesterday.  Interventions attempted: OTC medications: tylenol , ibuprofen .  Symptoms are: stable.  Triage Disposition: Go to ED Now (Notify PCP)  Patient/caregiver understands and will follow disposition?: yes     Copied from CRM #[id number]. Topic: Clinical - Red Word Triage >> May 23, 2024  8:10 AM Susanna ORN wrote: Red Word that prompted transfer to Nurse Triage: Patient states that she fell yesterday and hit her head. She has lumps all over her body. States she has no bleeding nor did she black out but she is swollen & thinks she may need x-rays. Reason for Disposition  [1] ACUTE NEURO SYMPTOM AND [2] now fine  (Definition: Difficult to awaken OR confused thinking and talking OR slurred speech OR weakness of arms or legs OR unsteady walking.)  Answer Assessment - Initial Assessment Questions 1. MECHANISM: How did the injury happen? For falls, ask: What height did you fall from? and What surface did you fall against?      Fell yesterday hitting head against tile 2. ONSET: When did the injury happen? (e.g., minutes, hours ago)      yesterday 3. NEUROLOGIC SYMPTOMS: Was there any loss of consciousness? Are there any other neurological symptoms?      No loss of consciousness, states has been confused. Able to answers A&O questions 4. MENTAL STATUS: Does the person know who they are, who you are, and where they are?      yes 5. LOCATION: What part of the head was hit?       6. SCALP APPEARANCE: What does the scalp look like? Is it bleeding now? If Yes, ask: Is it difficult to stop?      Knots 7. SIZE: For cuts, bruises, or swelling, ask: How large is it? (e.g., inches or centimeters)      Knots  8. PAIN: Is there any pain? If Yes, ask: How bad is it? (Scale 0-10;  or none, mild, moderate, severe)     Pain 8/10 9. TETANUS: For any breaks in the skin, ask: When was your last tetanus booster?      10. BLOOD THINNERS: Do you take any blood thinners? (e.g., aspirin , clopidogrel / Plavix, coumadin, heparin ). Notes: Other strong blood thinners include: Arixtra (fondaparinux), Eliquis  (apixaban ), Pradaxa (dabigatran), and Xarelto (rivaroxaban).       yes 11. OTHER SYMPTOMS: Do you have any other symptoms? (e.g., neck pain, vomiting)       Neck pain  Protocols used: Head Injury-A-AH

## 2024-05-23 NOTE — ED Triage Notes (Signed)
 Mechanical fall yesterday morning. Reports hitting head over floor. Takes eliquis . Denies LOC. Pain on R shoulder and R hip as well.

## 2024-05-23 NOTE — Discharge Instructions (Addendum)
 1.  Your CT scans and x-rays do not show any new broken bones or internal injuries.  You have chronic compression fractures in the spine which likely cause you pain.  You may certainly have caused some increased strain in the area.  You also have multiple areas of bruises which will be painful.  Apply ice packs to bruised areas.  These may look much more purpleish and large over the next few days.  Continue your regular pain medications.  If you have additional pain in your back, use over-the-counter lidocaine  patches or other home remedies that you have successfully used for back pain. 2.  Schedule a follow-up appointment with your doctor for recheck in 3 to 5 days.  Return to the emergency department if you have sudden new worsening or concerning symptoms.

## 2024-05-23 NOTE — ED Provider Notes (Signed)
 " [place] EMERGENCY DEPARTMENT AT [facility] Provider Note   CSN: [id number] Arrival date & time: 05/23/24  1016     Patient presents with: Amber Stephenson is a 78 y.o. female.    Fall     78 year old female with medical history significant for pulmonary hypertension, CAD, HTN, OSA, diabetes mellitus, diastolic CHF, myofascial pain dysfunction syndrome, bipolar disorder, PTSD, atrial fibrillation on anticoagulation who presents to the emergency department after a mechanical fall.  The patient states that the fall occurred yesterday.  She sustained a mechanical fall tripping and landing on her right side also striking her head.  She denies loss of consciousness.  She takes Eliquis  and her last Eliquis  was this morning.  She endorses right-sided shoulder pain, right hip pain with associated swelling and hematoma to the right hip.  She also endorses knee pain.  She arrives GCS 15, ABC intact.  Prior to Admission medications  Medication Sig Start Date End Date Taking? Authorizing Provider  acetaminophen  (TYLENOL ) 325 MG tablet Take 2 tablets (650 mg total) by mouth every 6 (six) hours as needed for mild pain (pain score 1-3) or fever (or Fever >/= 101). 01/23/24   Emokpae, Courage, MD  ALPRAZolam  (XANAX ) 1 MG tablet Take 1 mg by mouth at bedtime as needed for sleep. 12/21/23   [provider]  ARIPiprazole  (ABILIFY ) 2 MG tablet Take 1 tablet (2 mg total) by mouth at bedtime. Patient taking differently: Take 2 mg by mouth 2 (two) times daily. 01/23/24   Pearlean Manus, MD  atorvastatin  (LIPITOR) 80 MG tablet Take 1 tablet (80 mg total) by mouth daily. 01/23/24 05/18/24  Pearlean Manus, MD  carbidopa -levodopa  (SINEMET ) 10-100 MG tablet Take 1 tablet by mouth 4 (four) times daily. For restless leg syndrome 01/23/24   Pearlean Manus, MD  colchicine  0.6 MG tablet Take 1 tablet (0.6 mg total) by mouth every 12 (twelve) hours as needed (gout flare.). For mouth  sores Patient not taking: Reported on 05/18/2024 02/25/24   Ricky Fines, MD  Continuous Glucose Sensor (DEXCOM G7 SENSOR) MISC CHANGE SENSOR EVERY 10 DAYS 03/30/24   Shamleffer, Donell Cardinal, MD  cyanocobalamin  (VITAMIN B12) 1000 MCG tablet Take 1 tablet (1,000 mcg total) by mouth daily. 09/04/22   Ghimire, Donalda HERO, MD  diazepam  (VALIUM ) 2 MG tablet Take 2 tablets (4 mg total) by mouth daily. Don't give increase in Robaxin  until valium  completed- To break pain cycle 05/18/24   Lovorn, Megan, MD  diphenoxylate -atropine  (LOMOTIL ) 2.5-0.025 MG tablet TAKE 2 TABLETS BY MOUTH FOUR TIMES DAILY AS NEEDED FOR DIARRHEA OR LOOSE STOOLS 11/24/23   [name], MD  doxycycline  (VIBRA -TABS) 100 MG tablet Take 1 tablet (100 mg total) by mouth 2 (two) times daily. 1 po bid 04/08/24   Gladis Mustard, FNP  ELIQUIS  5 MG TABS tablet TAKE 1 TABLET TWICE A DAY 11/02/23   Shlomo Wilbert SAUNDERS, MD  escitalopram  (LEXAPRO ) 10 MG tablet Take 10 mg by mouth daily.    [provider]  ferrous sulfate  325 (65 FE) MG EC tablet Take 1 tablet (325 mg total) by mouth 2 (two) times daily. 02/25/24 02/24/25  Ricky Fines, MD  fluconazole  (DIFLUCAN ) 150 MG tablet Take 1 tablet (150 mg total) by mouth every 3 (three) days. 04/19/24   Lavell Lye A, FNP  furosemide  (LASIX ) 40 MG tablet Take 1 tablet (40 mg total) by mouth 2 (two) times daily. 03/02/24   Hayes Beckey CROME, NP  gabapentin  (NEURONTIN ) 100 MG  capsule Take 1 capsule (100 mg total) by mouth 3 (three) times daily. 01/23/24   Pearlean Manus, MD  hydrocortisone  (ANUSOL -HC) 2.5 % rectal cream Place rectally 2 (two) times daily. 02/25/24   Ricky Fines, MD  hydrOXYzine  (VISTARIL ) 25 MG capsule Take 1 capsule (25 mg total) by mouth every 8 (eight) hours as needed for anxiety. 01/23/24   Pearlean Manus, MD  insulin  glargine (LANTUS  SOLOSTAR) 100 UNIT/ML Solostar Pen Inject 10 Units into the skin at bedtime. Patient taking differently: Inject 8 Units into the skin at  bedtime. 01/23/24   Pearlean Manus, MD  insulin  lispro (HUMALOG  KWIKPEN) 100 UNIT/ML KwikPen Inject 0-10 Units into the skin 2 (two) times daily before a meal. Sliding scale Short acting Humalog  insulin  per sliding scale 0-10 ---:-- Insulin  injection 0-10 Units 0-10 Units Subcutaneous, 3 times daily with meals CBG 70 - 120: 0 unit CBG 121 - 150: 0 unit  CBG 151 - 200: 1 unit CBG 201 - 250: 2 units CBG 251 - 300: 4 units CBG 301 - 350: 6 units  CBG 351 - 400: 8 units  CBG > 400: 10 units 01/23/24   Emokpae, Courage, MD  ketoconazole  (NIZORAL ) 2 % cream Apply 1 Application topically daily. Patient not taking: Reported on 05/18/2024 04/19/24   Lavell Lye A, FNP  lidocaine  (LIDODERM ) 5 % Place 1 patch onto the skin daily. Remove & Discard patch within 12 hours or as directed by MD 02/26/24   Ricky Fines, MD  lidocaine  (XYLOCAINE ) 5 % ointment Apply 1 Application topically 4 (four) times daily as needed. For foot and ankle nerve pain-  can use up to 4x/day as needed 03/11/24   Lovorn, Megan, MD  losartan  (COZAAR ) 25 MG tablet Take 25 mg by mouth daily. 02/14/23   [provider]  MAGNESIUM  PO Take 1 tablet by mouth daily.    [provider]  Methocarbamol  1000 MG TABS Take 1,000 mg by mouth every 6 (six) hours as needed. 05/18/24   Lovorn, Megan, MD  metoprolol  tartrate (LOPRESSOR ) 50 MG tablet Take 1 tablet (50 mg total) by mouth 2 (two) times daily. 01/23/24   Pearlean Manus, MD  Misc. Devices (3-IN-1 BEDSIDE TOILET) MISC Length of need 99 months M17.11 Osteoarthritis right knee / multiple falls / high risk fall 02/08/24   Margrette Taft BRAVO, MD  nystatin  (MYCOSTATIN ) 100000 UNIT/ML suspension Take 5 mLs (500,000 Units total) by mouth 4 (four) times daily. 04/19/24   Lavell Lye LABOR, FNP  ondansetron  (ZOFRAN ) 4 MG tablet Take 1 tablet (4 mg total) by mouth every 8 (eight) hours as needed for nausea or vomiting. For cancer related nausea 01/01/24   Dettinger, Fonda LABOR, MD   OXcarbazepine  (TRILEPTAL ) 150 MG tablet Take 1 tablet (150 mg total) by mouth 2 (two) times daily. 06/13/22   Sherrill Cable Latif, DO  oxyCODONE  (OXYCONTIN ) 20 mg 12 hr tablet Take 1 tablet (20 mg total) by mouth every 12 (twelve) hours. G89.3- chronic pain- fill 28 days after 04/23/2024 03/09/24   Debby Fidela CROME, NP  OXYCONTIN  20 MG 12 hr tablet Take 1 tablet (20 mg total) by mouth every 12 (twelve) hours. G89.3- chronic pain- dispense in 28 days 05/18/24   Lovorn, Megan, MD  OXYCONTIN  20 MG 12 hr tablet Take 1 tablet (20 mg total) by mouth every 12 (twelve) hours. For chronic pain G89.3- chronic pain- due to lumbar stenosis- dispense now 05/18/24   Lovorn, Megan, MD  pantoprazole  (PROTONIX ) 40 MG tablet Take 1 tablet (  40 mg total) by mouth 2 (two) times daily. 01/23/24   Pearlean Manus, MD  potassium chloride  (KLOR-CON ) 10 MEQ tablet Take 1 tablet (10 mEq total) by mouth daily. 01/29/24   Shlomo Wilbert SAUNDERS, MD  pregabalin  (LYRICA ) 25 MG capsule Take 1 capsule (25 mg total) by mouth 3 (three) times daily. Will start at at bedtime x 4 days then BID x 4 days, then TID- for nerve pain Patient taking differently: Take 25 mg by mouth 2 (two) times daily. 03/11/24   Lovorn, Megan, MD  rOPINIRole  (REQUIP ) 2 MG tablet Take 1 tablet (2 mg total) by mouth at bedtime. 02/09/24   [name], MD  spironolactone  (ALDACTONE ) 25 MG tablet Take 0.5 tablets (12.5 mg total) by mouth daily. Patient not taking: Reported on 05/18/2024 03/02/24   Hayes Beckey CROME, NP  tirzepatide  (MOUNJARO ) 12.5 MG/0.5ML Pen Inject 12.5 mg into the skin once a week. 03/23/24   Shamleffer, Ibtehal Jaralla, MD  vitamin C  (VITAMIN C ) 500 MG tablet Take 0.5 tablets (250 mg total) by mouth 2 (two) times daily. 02/25/24   Ricky Fines, MD  Vitamin D , Cholecalciferol , 25 MCG (1000 UT) TABS Take 1,000 Units by mouth in the morning. 07/26/20   [provider]    Allergies: Iodinated contrast media, Iodine, Latex, Tape, and Tizanidine      Review of Systems  All other systems reviewed and are negative.   Updated Vital Signs BP 131/75 (BP Location: Left Arm)   Pulse 80   Temp 97.8 F (36.6 C)   Resp 20   SpO2 95%   Physical Exam Vitals and nursing note reviewed.  Constitutional:      General: She is not in acute distress.    Appearance: She is well-developed.     Comments: GCS 15, ABC intact  HENT:     Head: Normocephalic.     Comments: Hematoma to the right parietal scalp, hemostatic Eyes:     Extraocular Movements: Extraocular movements intact.     Conjunctiva/sclera: Conjunctivae normal.     Pupils: Pupils are equal, round, and reactive to light.  Neck:     Comments: No midline tenderness to palpation of the cervical spine.  Range of motion intact Cardiovascular:     Rate and Rhythm: Normal rate and regular rhythm.  Pulmonary:     Effort: Pulmonary effort is normal. No respiratory distress.     Breath sounds: Normal breath sounds.  Chest:     Comments: Clavicles stable nontender to AP compression.  Chest wall with midsternal tenderness to palpation Abdominal:     Palpations: Abdomen is soft.     Tenderness: There is abdominal tenderness.     Comments: Pelvis stable to lateral compression  Musculoskeletal:     Cervical back: Neck supple.     Comments: No midline tenderness to palpation of the thoracic spine, mild midline tenderness of the lumbar spine.  Extremities atraumatic with intact range of motion with the exception of tenderness palpation of the right shoulder, right knee and right hip about the right thigh where there is a large hematoma palpated.  Skin:    General: Skin is warm and dry.  Neurological:     Mental Status: She is alert.     Comments: Cranial nerves II through XII grossly intact.  Moving all 4 extremities spontaneously.  Sensation grossly intact all 4 extremities     (all labs ordered are listed, but only abnormal results are displayed) Labs Reviewed  CBC WITH  DIFFERENTIAL/PLATELET -  Abnormal; Notable for the following components:      Result Value   RBC 3.36 (*)    Hemoglobin 9.9 (*)    HCT 29.7 (*)    All other components within normal limits  COMPREHENSIVE METABOLIC PANEL WITH GFR - Abnormal; Notable for the following components:   Glucose, Bld 121 (*)    All other components within normal limits  TROPONIN T, HIGH SENSITIVITY    EKG: None  Radiology: CT Cervical Spine Wo Contrast Result Date: 05/23/2024 EXAM: CT CERVICAL SPINE WITHOUT CONTRAST 05/23/2024 10:46:37 AM TECHNIQUE: CT of the cervical spine was performed without the administration of intravenous contrast. Multiplanar reformatted images are provided for review. Automated exposure control, iterative reconstruction, and/or weight based adjustment of the mA/kV was utilized to reduce the radiation dose to as low as reasonably achievable. COMPARISON: CT of the cervical spine dated 11/29/2023. CLINICAL HISTORY: Neck trauma (Age >= 65y). FINDINGS: BONES AND ALIGNMENT: Mild reversal of the normal cervical lordosis. No acute fracture or traumatic malalignment. DEGENERATIVE CHANGES: Mild chronic degenerative disc disease and facet arthrosis at C3-C4, C4-C5, C5-C6, and C6-C7. At C5-C6, there is central spinal canal stenosis. The neural foramina are patent. At C6-C7, there is a right posterolateral disc osteophyte complex causing mild right-sided spinal canal stenosis. SOFT TISSUES: No prevertebral soft tissue swelling. IMPRESSION: 1. No acute fracture or traumatic malalignment. 2. Mild reversal of the normal cervical lordosis. 3. Mild chronic degenerative disc disease and facet arthrosis at C3-4, C4-5, C5-6, and C6-7. 4. Central spinal canal stenosis at C5-6. 5. Right posterolateral disc osteophyte complex at C6-7 causing mild right-sided spinal canal stenosis. Electronically signed by: Evalene Coho MD 05/23/2024 11:02 AM EST RP Workstation: [id number]   CT Head Wo Contrast Result Date:  05/23/2024 EXAM: CT HEAD WITHOUT CONTRAST 05/23/2024 10:46:37 AM TECHNIQUE: CT of the head was performed without the administration of intravenous contrast. Automated exposure control, iterative reconstruction, and/or weight based adjustment of the mA/kV was utilized to reduce the radiation dose to as low as reasonably achievable. COMPARISON: 01/29/2024 CLINICAL HISTORY: Head trauma, minor (Age >= 65y) FINDINGS: BRAIN AND VENTRICLES: No acute hemorrhage. No evidence of acute infarct. No hydrocephalus. No extra-axial collection. No mass effect or midline shift. Mild periventricular chronic small vessel ischemia. Atherosclerosis of skullbase vasculature without hyperdense vessel or abnormal calcification. ORBITS: No acute abnormality. Bilateral cataract resection noted. SINUSES: Mild mucosal disease within the floor of the left maxillary sinus. SOFT TISSUES AND SKULL: Right parietal scalp hematoma. No skull fracture. IMPRESSION: 1. No acute intracranial abnormality. 2. Right parietal scalp hematoma. 3. Mild chronic small vessel ischemic change. Electronically signed by: Evalene Coho MD 05/23/2024 10:58 AM EST RP Workstation: [id number]     Procedures   Medications Ordered in the ED  diphenhydrAMINE  (BENADRYL ) capsule 50 mg (has no administration in time range)    Or  diphenhydrAMINE  (BENADRYL ) injection 50 mg (has no administration in time range)  methylPREDNISolone  sodium succinate  (SOLU-MEDROL ) 40 mg/mL injection 40 mg (40 mg Intravenous Given 05/23/24 1147)  fentaNYL  (SUBLIMAZE ) injection 50 mcg (50 mcg Intravenous Given 05/23/24 1148)                                    Medical Decision Making Amount and/or Complexity of Data Reviewed Labs: ordered. Radiology: ordered.  Risk Prescription drug management.     78 year old female with medical history significant for pulmonary hypertension, CAD, HTN, OSA, diabetes mellitus, diastolic CHF, myofascial  pain dysfunction syndrome, bipolar  disorder, PTSD, atrial fibrillation on anticoagulation who presents to the emergency department after a mechanical fall.  The patient states that the fall occurred yesterday.  She sustained a mechanical fall tripping and landing on her right side also striking her head.  She denies loss of consciousness.  She takes Eliquis  and her last Eliquis  was this morning.  She endorses right-sided shoulder pain, right hip pain with associated swelling and hematoma to the right hip.  She also endorses knee pain.  She arrives GCS 15, ABC intact.  Amber Stephenson is a 78 y.o. female who presents by EMS as an unleveled Trauma patient after fall as per above.  On arrival, vitals stable. Currently, she is awake, alert, and protecting her own airway and is hemodynamically stable.  Trauma imaging revealed (full reports in EMR): Portable CXR:  No evidence of pneumothorax or tracheal deviation Portable Pelvis:  No evidence of acute hip fracture or malalignment FAST:  Not performed, not indicated, pt not hypotensive CT scans (pan-scan): Pending as pt has IV contrast allergy and needs premedication  CT Head: IMPRESSION:  1. No acute intracranial abnormality.  2. Right parietal scalp hematoma.  3. Mild chronic small vessel ischemic change.   CT Cervical Spine:  IMPRESSION:  1. No acute fracture or traumatic malalignment.  2. Mild reversal of the normal cervical lordosis.  3. Mild chronic degenerative disc disease and facet arthrosis at C3-4, C4-5,  C5-6, and C6-7.  4. Central spinal canal stenosis at C5-6.  5. Right posterolateral disc osteophyte complex at C6-7 causing mild  right-sided spinal canal stenosis.   XR Right Shoulder: IMPRESSION:  1. No acute osseous abnormality.  2. Reverse total right shoulder arthroplasty without evidence of hardware  complication.  3. Mild right acromioclavicular joint osteoarthritis.  4. Small subacromial spur.   XR Right Knee: IMPRESSION:  1. No evidence of acute  traumatic injury.  2. Mild osteoarthritis of the medial and lateral compartments.   XR Right Femur: IMPRESSION:  1. No acute fracture.  2. Mild degenerative changes of the right hip and knee joints.    Laboratory evaluation significant for cardiac troponin normal, CMP unremarkable, CBC with anemia with a hemoglobin of 9.9 but improved from previous measurements 2 months ago.  Patient awaiting CT imaging at time of signout given her known anaphylactic reaction to IV contrast, will be premedicated per protocol and has also worked for the patient in the past.  Signout given to Dr. Armenta to follow-up results of CT imaging, ultimate disposition pending results of diagnostic testing and reassessment.      Final diagnoses:  None    ED Discharge Orders     None          Jerrol Agent, MD 05/23/24 1753  "

## 2024-05-23 NOTE — Telephone Encounter (Signed)
 Noted

## 2024-05-23 NOTE — ED Provider Notes (Signed)
 Ct head pending. Generalized pain from mechanical fall CT chest abdo pelvis. Physical Exam  BP (!) 124/92 (BP Location: Left Arm)   Pulse 80   Temp 98.2 F (36.8 C) (Oral)   Resp 15   SpO2 91%   Physical Exam  Procedures  Procedures  ED Course / MDM    Medical Decision Making Amount and/or Complexity of Data Reviewed Labs: ordered. Radiology: ordered.  Risk Prescription drug management.  I have reviewed the results from CT imaging.  Radiology notes chronic compression fractures but no acute fractures or internal head, thoracic or abdominal injury.  Patient has several areas with hematomas.  We discussed use of ice and topical measures for additional pain control.  She does have chronic pain management at home which she can continue as well.  Patient reports she feels comfortable going at this point in time and will follow-up with her PCP.        Armenta Canning, MD 05/23/24 [phone]

## 2024-05-24 ENCOUNTER — Ambulatory Visit (HOSPITAL_COMMUNITY): Admitting: Physical Therapy

## 2024-05-24 ENCOUNTER — Telehealth (HOSPITAL_COMMUNITY): Payer: Self-pay | Admitting: Physical Therapy

## 2024-05-24 NOTE — Telephone Encounter (Signed)
 Pt did not show for appt today. Called and left VM regarding missed appt and reminder for next scheduled one.  Greig KATHEE Fuse, PTA/CLT [facility] Ph: [phone]

## 2024-05-30 ENCOUNTER — Ambulatory Visit (HOSPITAL_COMMUNITY)

## 2024-06-01 ENCOUNTER — Ambulatory Visit (HOSPITAL_COMMUNITY)

## 2024-06-06 ENCOUNTER — Emergency Department (HOSPITAL_BASED_OUTPATIENT_CLINIC_OR_DEPARTMENT_OTHER)

## 2024-06-06 ENCOUNTER — Emergency Department (HOSPITAL_BASED_OUTPATIENT_CLINIC_OR_DEPARTMENT_OTHER)
Admission: EM | Admit: 2024-06-06 | Discharge: 2024-06-06 | Disposition: A | Discharge status: Home / Self Care | Attending: Emergency Medicine | Admitting: Emergency Medicine

## 2024-06-06 ENCOUNTER — Other Ambulatory Visit: Payer: Self-pay

## 2024-06-06 ENCOUNTER — Encounter (HOSPITAL_BASED_OUTPATIENT_CLINIC_OR_DEPARTMENT_OTHER): Payer: Self-pay

## 2024-06-06 DIAGNOSIS — Z794 Long term (current) use of insulin: Secondary | ICD-10-CM | POA: Insufficient documentation

## 2024-06-06 DIAGNOSIS — K5730 Diverticulosis of large intestine without perforation or abscess without bleeding: Secondary | ICD-10-CM | POA: Insufficient documentation

## 2024-06-06 DIAGNOSIS — I2510 Atherosclerotic heart disease of native coronary artery without angina pectoris: Secondary | ICD-10-CM | POA: Insufficient documentation

## 2024-06-06 DIAGNOSIS — S065X0A Traumatic subdural hemorrhage without loss of consciousness, initial encounter: Secondary | ICD-10-CM | POA: Insufficient documentation

## 2024-06-06 DIAGNOSIS — I110 Hypertensive heart disease with heart failure: Secondary | ICD-10-CM | POA: Diagnosis not present

## 2024-06-06 DIAGNOSIS — S065XAA Traumatic subdural hemorrhage with loss of consciousness status unknown, initial encounter: Secondary | ICD-10-CM

## 2024-06-06 DIAGNOSIS — I5030 Unspecified diastolic (congestive) heart failure: Secondary | ICD-10-CM | POA: Diagnosis not present

## 2024-06-06 DIAGNOSIS — S0990XA Unspecified injury of head, initial encounter: Secondary | ICD-10-CM | POA: Diagnosis present

## 2024-06-06 DIAGNOSIS — M25561 Pain in right knee: Secondary | ICD-10-CM | POA: Diagnosis not present

## 2024-06-06 DIAGNOSIS — M545 Low back pain, unspecified: Secondary | ICD-10-CM | POA: Diagnosis not present

## 2024-06-06 DIAGNOSIS — W01198A Fall on same level from slipping, tripping and stumbling with subsequent striking against other object, initial encounter: Secondary | ICD-10-CM | POA: Insufficient documentation

## 2024-06-06 DIAGNOSIS — M546 Pain in thoracic spine: Secondary | ICD-10-CM | POA: Insufficient documentation

## 2024-06-06 DIAGNOSIS — Z91040 Latex allergy status: Secondary | ICD-10-CM | POA: Diagnosis not present

## 2024-06-06 DIAGNOSIS — Z7901 Long term (current) use of anticoagulants: Secondary | ICD-10-CM | POA: Insufficient documentation

## 2024-06-06 DIAGNOSIS — E119 Type 2 diabetes mellitus without complications: Secondary | ICD-10-CM | POA: Diagnosis not present

## 2024-06-06 DIAGNOSIS — Z79899 Other long term (current) drug therapy: Secondary | ICD-10-CM | POA: Insufficient documentation

## 2024-06-06 DIAGNOSIS — W19XXXA Unspecified fall, initial encounter: Secondary | ICD-10-CM

## 2024-06-06 LAB — COMPREHENSIVE METABOLIC PANEL WITH GFR
ALT: <5 U/L (ref 0–44)
AST: 17 U/L (ref 15–41)
Albumin: 3.4 g/dL — ABNORMAL LOW (ref 3.5–5.0)
Alkaline Phosphatase: 93 U/L (ref 38–126)
Anion gap: 9 (ref 5–15)
BUN: 12 mg/dL (ref 8–23)
CO2: 27 mmol/L (ref 22–32)
Calcium: 9.9 mg/dL (ref 8.9–10.3)
Chloride: 96 mmol/L — ABNORMAL LOW (ref 98–111)
Creatinine, Ser: 0.6 mg/dL (ref 0.44–1.00)
GFR, Estimated: >60 mL/min
Glucose, Bld: 131 mg/dL — ABNORMAL HIGH (ref 70–99)
Potassium: 3.9 mmol/L (ref 3.5–5.1)
Sodium: 132 mmol/L — ABNORMAL LOW (ref 135–145)
Total Bilirubin: 0.6 mg/dL (ref 0.0–1.2)
Total Protein: 6.7 g/dL (ref 6.5–8.1)

## 2024-06-06 LAB — PROTIME-INR
INR: 1.4 — ABNORMAL HIGH (ref 0.8–1.2)
Prothrombin Time: 17.4 s — ABNORMAL HIGH (ref 11.4–15.2)

## 2024-06-06 LAB — CBC WITH DIFFERENTIAL/PLATELET
Abs Immature Granulocytes: 0.03 K/uL (ref 0.00–0.07)
Basophils Absolute: 0 K/uL (ref 0.0–0.1)
Basophils Relative: 0 %
Eosinophils Absolute: 0.2 K/uL (ref 0.0–0.5)
Eosinophils Relative: 3 %
HCT: 26.6 % — ABNORMAL LOW (ref 36.0–46.0)
Hemoglobin: 9 g/dL — ABNORMAL LOW (ref 12.0–15.0)
Immature Granulocytes: 1 %
Lymphocytes Relative: 14 %
Lymphs Abs: 0.8 K/uL (ref 0.7–4.0)
MCH: 29 pg (ref 26.0–34.0)
MCHC: 33.8 g/dL (ref 30.0–36.0)
MCV: 85.8 fL (ref 80.0–100.0)
Monocytes Absolute: 0.5 K/uL (ref 0.1–1.0)
Monocytes Relative: 10 %
Neutro Abs: 4.1 K/uL (ref 1.7–7.7)
Neutrophils Relative %: 72 %
Platelets: 181 K/uL (ref 150–400)
RBC: 3.1 MIL/uL — ABNORMAL LOW (ref 3.87–5.11)
RDW: 14.6 % (ref 11.5–15.5)
WBC: 5.7 K/uL (ref 4.0–10.5)
nRBC: 0 % (ref 0.0–0.2)

## 2024-06-06 MED ORDER — HYDROMORPHONE HCL 1 MG/ML IJ SOLN
1.0000 mg | Freq: Once | INTRAMUSCULAR | Status: AC
  Administered 2024-06-06: 1 mg via INTRAVENOUS
  Filled 2024-06-06: qty 1

## 2024-06-06 MED ORDER — FENTANYL CITRATE (PF) 50 MCG/ML IJ SOSY: 50.0000 ug | PREFILLED_SYRINGE | Freq: Once | INTRAMUSCULAR | Status: DC

## 2024-06-06 MED ORDER — DIPHENHYDRAMINE HCL 50 MG/ML IJ SOLN
50.0000 mg | Freq: Once | INTRAMUSCULAR | Status: AC
  Administered 2024-06-06: 50 mg via INTRAVENOUS
  Filled 2024-06-06 (×2): qty 1

## 2024-06-06 MED ORDER — METHYLPREDNISOLONE SODIUM SUCC 40 MG IJ SOLR
40.0000 mg | Freq: Once | INTRAMUSCULAR | Status: AC
  Administered 2024-06-06: 40 mg via INTRAVENOUS
  Filled 2024-06-06: qty 1

## 2024-06-06 MED ORDER — IOHEXOL 300 MG/ML  SOLN
100.0000 mL | Freq: Once | INTRAMUSCULAR | Status: AC | PRN
  Administered 2024-06-06: 100 mL via INTRAVENOUS

## 2024-06-06 MED ORDER — DIPHENHYDRAMINE HCL 25 MG PO CAPS: 50.0000 mg | ORAL_CAPSULE | Freq: Once | ORAL | Status: AC

## 2024-06-06 NOTE — ED Triage Notes (Signed)
 Patient reports mechanical fall hitting her face. She is on anticoagulants. Significant swelling and bruising of nasal bone noted.

## 2024-06-06 NOTE — ED Provider Notes (Signed)
 Fall on eliquis  today with hx of afib and needs repeat CT head 6 hours post fall due to 2mm subdural and NSU recommended repeat scan.  Pt's last eliquis  was last night.  Hold eliquis  for 72 hours.  And then will need f/u with cardiology to discuss whether pt should come off of eliquis  permenantly. 5:13 PM Head CT with no change in subdural still measuring 2 to 3 mm in thickness without increasing hemorrhage.  CT of the spine shows no new fractures and CT of the chest abdomen and pelvis showed a hematoma in the gluteal region but no other acute traumatic injury.  At this time patient is stable for discharge home.  Discussed all this with her and her husband and they are comfortable with this plan.  Discussed return precautions.   Doretha Folks, MD 06/06/24 1714

## 2024-06-06 NOTE — ED Notes (Signed)
 Pt had urinated on self. Sheets and gown were replaced and pt was wiped dry.

## 2024-06-06 NOTE — Discharge Instructions (Addendum)
 Hold Eliquis  for 72 hrs and restart on 06/09/24.  She does have a small amount of bleeding in the brain.  It should get better on its own but if she starts having repetitive vomiting, inability to wake up, severe confusion, inability to walk or other concerns you need to return to the emergency room immediately for repeat imaging.  Also she will need to follow-up with her cardiologist who should call you but you could also call the office to confirm to discuss if she should be on Eliquis  at all.

## 2024-06-06 NOTE — ED Provider Notes (Signed)
 " [place] EMERGENCY DEPARTMENT AT [facility] Provider Note   CSN: [id number] Arrival date & time: 06/06/24  9166     Patient presents with: Amber Stephenson is a 79 y.o. female.    Fall      79 year old female with medical history significant for pulmonary hypertension, CAD, HTN, OSA, diabetes mellitus, diastolic CHF, myofascial pain dysfunction syndrome, bipolar disorder, PTSD, atrial fibrillation on Eliquis  who presents to the emergency department after a fall.  The patient endorses right knee pain, back pain in the midline.  She denies loss of consciousness.  She describes a mechanical fall.  She struck her face and endorses nose pain and swelling.  She last took her Eliquis  at 10 PM last night.  She arrives GCS 15, ABC intact.  Prior to Admission medications  Medication Sig Start Date End Date Taking? Authorizing Provider  acetaminophen  (TYLENOL ) 325 MG tablet Take 2 tablets (650 mg total) by mouth every 6 (six) hours as needed for mild pain (pain score 1-3) or fever (or Fever >/= 101). 01/23/24   Emokpae, Courage, MD  ALPRAZolam  (XANAX ) 1 MG tablet Take 1 mg by mouth at bedtime as needed for sleep. 12/21/23   [provider]  ARIPiprazole  (ABILIFY ) 2 MG tablet Take 1 tablet (2 mg total) by mouth at bedtime. Patient taking differently: Take 2 mg by mouth 2 (two) times daily. 01/23/24   Pearlean Manus, MD  atorvastatin  (LIPITOR) 80 MG tablet Take 1 tablet (80 mg total) by mouth daily. 01/23/24 05/18/24  Pearlean Manus, MD  carbidopa -levodopa  (SINEMET ) 10-100 MG tablet Take 1 tablet by mouth 4 (four) times daily. For restless leg syndrome 01/23/24   Pearlean Manus, MD  cephALEXin  (KEFLEX ) 500 MG capsule Take 500 mg by mouth 3 (three) times daily. 05/09/24   [provider]  colchicine  0.6 MG tablet Take 1 tablet (0.6 mg total) by mouth every 12 (twelve) hours as needed (gout flare.). For mouth sores Patient not taking: Reported on 05/18/2024  02/25/24   Ricky Fines, MD  Continuous Glucose Sensor (DEXCOM G7 SENSOR) MISC CHANGE SENSOR EVERY 10 DAYS 03/30/24   Shamleffer, Donell Cardinal, MD  cyanocobalamin  (VITAMIN B12) 1000 MCG tablet Take 1 tablet (1,000 mcg total) by mouth daily. 09/04/22   Ghimire, Donalda HERO, MD  diazepam  (VALIUM ) 2 MG tablet Take 2 tablets (4 mg total) by mouth daily. Don't give increase in Robaxin  until valium  completed- To break pain cycle 05/18/24   Lovorn, Megan, MD  diphenoxylate -atropine  (LOMOTIL ) 2.5-0.025 MG tablet TAKE 2 TABLETS BY MOUTH FOUR TIMES DAILY AS NEEDED FOR DIARRHEA OR LOOSE STOOLS 11/24/23   [name], MD  doxycycline  (VIBRA -TABS) 100 MG tablet Take 1 tablet (100 mg total) by mouth 2 (two) times daily. 1 po bid 04/08/24   Gladis Mustard, FNP  ELIQUIS  5 MG TABS tablet TAKE 1 TABLET TWICE A DAY 11/02/23   Shlomo Wilbert SAUNDERS, MD  escitalopram  (LEXAPRO ) 10 MG tablet Take 10 mg by mouth daily.    [provider]  ferrous sulfate  325 (65 FE) MG EC tablet Take 1 tablet (325 mg total) by mouth 2 (two) times daily. 02/25/24 02/24/25  Ricky Fines, MD  fluconazole  (DIFLUCAN ) 150 MG tablet Take 1 tablet (150 mg total) by mouth every 3 (three) days. 04/19/24   Lavell Lye A, FNP  furosemide  (LASIX ) 40 MG tablet Take 1 tablet (40 mg total) by mouth 2 (two) times daily. 03/02/24   Hayes Beckey CROME, NP  gabapentin  (NEURONTIN ) 100 MG  capsule Take 1 capsule (100 mg total) by mouth 3 (three) times daily. 01/23/24   Pearlean Manus, MD  hydrocortisone  (ANUSOL -HC) 2.5 % rectal cream Place rectally 2 (two) times daily. 02/25/24   Ricky Fines, MD  hydrOXYzine  (VISTARIL ) 25 MG capsule Take 1 capsule (25 mg total) by mouth every 8 (eight) hours as needed for anxiety. 01/23/24   Pearlean Manus, MD  insulin  glargine (LANTUS  SOLOSTAR) 100 UNIT/ML Solostar Pen Inject 10 Units into the skin at bedtime. Patient taking differently: Inject 8 Units into the skin at bedtime. 01/23/24   Pearlean Manus, MD  insulin   lispro (HUMALOG  KWIKPEN) 100 UNIT/ML KwikPen Inject 0-10 Units into the skin 2 (two) times daily before a meal. Sliding scale Short acting Humalog  insulin  per sliding scale 0-10 ---:-- Insulin  injection 0-10 Units 0-10 Units Subcutaneous, 3 times daily with meals CBG 70 - 120: 0 unit CBG 121 - 150: 0 unit  CBG 151 - 200: 1 unit CBG 201 - 250: 2 units CBG 251 - 300: 4 units CBG 301 - 350: 6 units  CBG 351 - 400: 8 units  CBG > 400: 10 units 01/23/24   Emokpae, Courage, MD  ketoconazole  (NIZORAL ) 2 % cream Apply 1 Application topically daily. Patient not taking: Reported on 05/18/2024 04/19/24   Lavell Lye A, FNP  lidocaine  (LIDODERM ) 5 % Place 1 patch onto the skin daily. Remove & Discard patch within 12 hours or as directed by MD 02/26/24   Ricky Fines, MD  lidocaine  (XYLOCAINE ) 5 % ointment Apply 1 Application topically 4 (four) times daily as needed. For foot and ankle nerve pain-  can use up to 4x/day as needed 03/11/24   Lovorn, Megan, MD  losartan  (COZAAR ) 25 MG tablet Take 25 mg by mouth daily. 02/14/23   [provider]  MAGNESIUM  PO Take 1 tablet by mouth daily.    [provider]  Methocarbamol  1000 MG TABS Take 1,000 mg by mouth every 6 (six) hours as needed. 05/18/24   Lovorn, Megan, MD  metoprolol  tartrate (LOPRESSOR ) 50 MG tablet Take 1 tablet (50 mg total) by mouth 2 (two) times daily. 01/23/24   Pearlean Manus, MD  Misc. Devices (3-IN-1 BEDSIDE TOILET) MISC Length of need 99 months M17.11 Osteoarthritis right knee / multiple falls / high risk fall 02/08/24   Margrette Taft BRAVO, MD  nystatin  (MYCOSTATIN ) 100000 UNIT/ML suspension Take 5 mLs (500,000 Units total) by mouth 4 (four) times daily. 04/19/24   Lavell Lye A, FNP  ondansetron  (ZOFRAN ) 4 MG tablet Take 1 tablet (4 mg total) by mouth every 8 (eight) hours as needed for nausea or vomiting. For cancer related nausea 01/01/24   Dettinger, Fonda LABOR, MD  OXcarbazepine  (TRILEPTAL ) 150 MG tablet Take 1 tablet (150 mg  total) by mouth 2 (two) times daily. 06/13/22   Sherrill Cable Latif, DO  oxyCODONE  (OXYCONTIN ) 20 mg 12 hr tablet Take 1 tablet (20 mg total) by mouth every 12 (twelve) hours. G89.3- chronic pain- fill 28 days after 04/23/2024 03/09/24   Debby Fidela CROME, NP  OXYCONTIN  20 MG 12 hr tablet Take 1 tablet (20 mg total) by mouth every 12 (twelve) hours. G89.3- chronic pain- dispense in 28 days 05/18/24   Lovorn, Megan, MD  OXYCONTIN  20 MG 12 hr tablet Take 1 tablet (20 mg total) by mouth every 12 (twelve) hours. For chronic pain G89.3- chronic pain- due to lumbar stenosis- dispense now 05/18/24   Lovorn, Megan, MD  pantoprazole  (PROTONIX ) 40 MG tablet Take 1 tablet (  40 mg total) by mouth 2 (two) times daily. 01/23/24   Pearlean Manus, MD  potassium chloride  (KLOR-CON ) 10 MEQ tablet Take 1 tablet (10 mEq total) by mouth daily. 01/29/24   Shlomo Wilbert SAUNDERS, MD  pregabalin  (LYRICA ) 25 MG capsule Take 1 capsule (25 mg total) by mouth 3 (three) times daily. Will start at at bedtime x 4 days then BID x 4 days, then TID- for nerve pain Patient taking differently: Take 25 mg by mouth 2 (two) times daily. 03/11/24   Lovorn, Megan, MD  rOPINIRole  (REQUIP ) 2 MG tablet Take 1 tablet (2 mg total) by mouth at bedtime. 02/09/24   [name], MD  spironolactone  (ALDACTONE ) 25 MG tablet Take 0.5 tablets (12.5 mg total) by mouth daily. Patient not taking: Reported on 05/18/2024 03/02/24   Hayes Beckey CROME, NP  tirzepatide  (MOUNJARO ) 12.5 MG/0.5ML Pen Inject 12.5 mg into the skin once a week. 03/23/24   Shamleffer, Ibtehal Jaralla, MD  vitamin C  (VITAMIN C ) 500 MG tablet Take 0.5 tablets (250 mg total) by mouth 2 (two) times daily. 02/25/24   Ricky Fines, MD  Vitamin D , Cholecalciferol , 25 MCG (1000 UT) TABS Take 1,000 Units by mouth in the morning. 07/26/20   [provider]    Allergies: Iodinated contrast media, Iodine, Latex, Tape, and Tizanidine     Review of Systems  All other systems reviewed and are  negative.   Updated Vital Signs BP (!) 150/105 (BP Location: Right Arm)   Pulse 96   Temp 97.6 F (36.4 C) (Oral)   Resp 19   SpO2 94%   Physical Exam Vitals and nursing note reviewed.  Constitutional:      General: She is not in acute distress.    Appearance: She is well-developed.     Comments: GCS 15, ABC intact  HENT:     Head: Normocephalic.     Comments: Extensive swelling to the nose, old ecchymosis noted to the right side of the face Eyes:     Extraocular Movements: Extraocular movements intact.     Conjunctiva/sclera: Conjunctivae normal.     Pupils: Pupils are equal, round, and reactive to light.  Neck:     Comments: No midline tenderness to palpation of the cervical spine.  Range of motion intact Cardiovascular:     Rate and Rhythm: Normal rate and regular rhythm.  Pulmonary:     Effort: Pulmonary effort is normal. No respiratory distress.     Breath sounds: Normal breath sounds.  Chest:     Comments: Clavicles stable nontender to AP compression.  Chest wall stable and nontender to AP and lateral compression. Abdominal:     Palpations: Abdomen is soft.     Tenderness: There is no abdominal tenderness.     Comments: Pelvis stable to lateral compression  Musculoskeletal:     Cervical back: Neck supple.     Comments: Tenderness palpation of the lower thoracic and upper lumbar spine, lateral tenderness of the right knee, extremities otherwise atraumatic  Skin:    General: Skin is warm and dry.  Neurological:     Mental Status: She is alert.     Comments: Cranial nerves II through XII grossly intact.  Moving all 4 extremities spontaneously.  Sensation grossly intact all 4 extremities     (all labs ordered are listed, but only abnormal results are displayed) Labs Reviewed  CBC WITH DIFFERENTIAL/PLATELET - Abnormal; Notable for the following components:      Result Value   RBC 3.10 (*)  Hemoglobin 9.0 (*)    HCT 26.6 (*)    All other components within  normal limits  COMPREHENSIVE METABOLIC PANEL WITH GFR - Abnormal; Notable for the following components:   Sodium 132 (*)    Chloride 96 (*)    Glucose, Bld 131 (*)    Albumin  3.4 (*)    All other components within normal limits  PROTIME-INR - Abnormal; Notable for the following components:   Prothrombin Time 17.4 (*)    INR 1.4 (*)    All other components within normal limits    EKG: EKG Interpretation Date/Time:  Monday June 06 2024 10:48:05 EST Ventricular Rate:  70 PR Interval:    QRS Duration:  94 QT Interval:  412 QTC Calculation: 461 R Axis:   16  Text Interpretation: Atrial flutter with predominant 4:1 AV block no stemi Confirmed by Jerrol Agent ([id number]) on 06/06/2024 12:25:41 PM  Radiology: [facility]A Knee Right Port Result Date: 06/06/2024 EXAM: 1 or 2 VIEW(S) XRAY OF THE KNEE 06/06/2024 12:21:49 PM COMPARISON: Comparison 05/23/2024. CLINICAL HISTORY: fall FINDINGS: BONES AND JOINTS: No acute fracture. No malalignment. No significant joint effusion. Mild 3 compartmental osteoarthritis most pronounced in medial compartment. SOFT TISSUES: The soft tissues are unremarkable. IMPRESSION: 1. No evidence of acute traumatic injury. Electronically signed by: Dayne Hassell MD 06/06/2024 01:12 PM EST RP Workstation: [id number]   CT Cervical Spine Wo Contrast Result Date: 06/06/2024 EXAM: CT CERVICAL SPINE WITHOUT CONTRAST 06/06/2024 09:46:29 AM TECHNIQUE: CT of the cervical spine was performed without the administration of intravenous contrast. Multiplanar reformatted images are provided for review. Automated exposure control, iterative reconstruction, and/or weight based adjustment of the mA/kV was utilized to reduce the radiation dose to as low as reasonably achievable. COMPARISON: CT of the cervical spine dated 05/23/2024. CLINICAL HISTORY: Facial trauma, blunt. FINDINGS: BONES AND ALIGNMENT: No acute fracture or traumatic malalignment. No significant interval change. There is slight  degenerative anterolisthesis at C4-C5. DEGENERATIVE CHANGES: There is multilevel chronic degenerative disc disease and facet arthrosis again demonstrated, most pronounced at C5-C6 and C6-C7. SOFT TISSUES: No prevertebral soft tissue swelling. The paraspinous soft tissues are unremarkable. There is calcification within the carotid bulbs. IMPRESSION: 1. No evidence of fracture or significant interval change. 2. Slight degenerative anterolisthesis at C4-5. 3. Multilevel chronic degenerative disc disease and facet arthrosis, most pronounced at C5-6 and C6-7. 4. Carotid bulb calcifications. Electronically signed by: Evalene Coho MD 06/06/2024 10:04 AM EST RP Workstation: [id number]   CT Head Wo Contrast Result Date: 06/06/2024 EXAM: CT HEAD WITHOUT CONTRAST 06/06/2024 09:46:29 AM TECHNIQUE: CT of the head was performed without the administration of intravenous contrast. Automated exposure control, iterative reconstruction, and/or weight based adjustment of the mA/kV was utilized to reduce the radiation dose to as low as reasonably achievable. COMPARISON: 05/23/2024 CLINICAL HISTORY: Head trauma, GCS=15, no focal neuro findings (low risk) (Ped 0-17y) FINDINGS: BRAIN AND VENTRICLES: Acute subdural hemorrhage along the interhemispheric fissure. Subdural hemorrhage measures up to 2 mm. No evidence of acute infarct. No hydrocephalus. No mass effect or midline shift. ORBITS: Bilateral lens replacement noted. SINUSES: There is mild mucosal disease within the floor of the left maxillary sinus. SOFT TISSUES AND SKULL: Nasal soft tissue swelling. Right parietal scalp hematoma. Traumatic brain injury risk stratification Skull fracture: Nondisplaced (MBIG 2) Subdural hematoma (SDH): <4 mm (MBIG 1) Subarachnoid hemorrhage [facility]): No Epidural hematoma (EDH): No (MBIG 1) Cerebral contusion, intra-axial, intraparenchymal hemorrhage (IPH): No Intraventricular hemorrhage (IVH): No (MBIG 1) Midline shift > 1 mm or edema/effacement of  sulci/vents: No (MBIG 1) IMPRESSION: 1. Acute subdural hemorrhage along the interhemispheric fissure, measuring up to 2 mm. 2. tbi risk stratification: low - mbig 1 Electronically signed by: Evalene Coho MD 06/06/2024 10:00 AM EST RP Workstation: [id number]   CT Maxillofacial Wo Contrast Result Date: 06/06/2024 EXAM: CT Face without contrast 06/06/2024 09:46:29 AM TECHNIQUE: CT of the face was performed without the administration of intravenous contrast. Multiplanar reformatted images are provided for review. Automated exposure control, iterative reconstruction, and/or weight based adjustment of the mA/kV was utilized to reduce the radiation dose to as low as reasonably achievable. COMPARISON: None available CLINICAL HISTORY: Facial trauma, blunt FINDINGS: AERODIGESTIVE TRACT: No mass. No edema. SALIVARY GLANDS: No acute abnormality. LYMPH NODES: No suspicious cervical lymphadenopathy. SOFT TISSUES: There is moderate soft tissue swelling of the tip of the nose, particularly on the left. BRAIN, ORBITS AND SINUSES: There is minimal mucosal disease present within the floor of the left maxillary sinus. BONES: The nasal bones are intact. No suspicious bone lesion. IMPRESSION: 1. Moderate soft tissue swelling at the tip of the nose, particularly on the left. No nasal bone fracture. 2. Minimal mucosal disease within the floor of the left maxillary sinus. Electronically signed by: Evalene Coho MD 06/06/2024 09:57 AM EST RP Workstation: [id number]     Procedures   Medications Ordered in the ED  methylPREDNISolone  sodium succinate  (SOLU-MEDROL ) 40 mg/mL injection 40 mg (40 mg Intravenous Given 06/06/24 1102)  diphenhydrAMINE  (BENADRYL ) capsule 50 mg ( Oral See Alternative 06/06/24 1355)    Or  diphenhydrAMINE  (BENADRYL ) injection 50 mg (50 mg Intravenous Given 06/06/24 1355)  HYDROmorphone  (DILAUDID ) injection 1 mg (1 mg Intravenous Given 06/06/24 1123)                                    Medical Decision  Making Amount and/or Complexity of Data Reviewed Labs: ordered. Radiology: ordered.  Risk Prescription drug management.     79 year old female with medical history significant for pulmonary hypertension, CAD, HTN, OSA, diabetes mellitus, diastolic CHF, myofascial pain dysfunction syndrome, bipolar disorder, PTSD, atrial fibrillation on Eliquis  who presents to the emergency department after a fall.  The patient endorses right knee pain, back pain in the midline.  She denies loss of consciousness.  She describes a mechanical fall.  She struck her face and endorses nose pain and swelling.  She last took her Eliquis  at 10 PM last night.  She arrives GCS 15, ABC intact.  On arrival, the patient was afebrile, not tachycardic or tachypneic, BP 156/79, saturating 98% on room air.  On exam the patient had evidence of facial trauma, is on Eliquis , will obtain CT imaging of the head, cervical spine and CT max face to further evaluate.  Given the patient's additional findings, obtain x-ray imaging of the right knee, CT chest abdomen pelvis to further evaluate.   CT head: Evidence of a 2 mm subdural hematoma with no midline shift IMPRESSION:  1. Acute subdural hemorrhage along the interhemispheric fissure, measuring up  to 2 mm.  2. tbi risk stratification: low - mbig 1    Spoke with Dr. Onetha, recommended holding Eliquis  for 72 hours minimum, discuss with cardiology need for continued Eliquis  use. Did not recommend Eliquis  reversal.   CT Cervical Spine: IMPRESSION:  1. No evidence of fracture or significant interval change.  2. Slight degenerative anterolisthesis at C4-5.  3. Multilevel chronic degenerative disc disease and facet arthrosis,  most  pronounced at C5-6 and C6-7.  4. Carotid bulb calcifications.   CT Maxface: IMPRESSION:  1. Moderate soft tissue swelling at the tip of the nose, particularly on the  left. No nasal bone fracture.  2. Minimal mucosal disease within the floor of the left  maxillary sinus.   XR Right Knee:  IMPRESSION:  1. No evidence of acute traumatic injury.   Patient has a known contrast allergy, will need to be premedicated per the radiology protocol.  CT chest abdomen pelvis: pending at signout CT  T spine no charge: pending at signout CT L-spine no charge: pending at signout  Repeat 6-hour CT head to ensure stability of subdural: pending at signout  Cardiology consult regarding continuation of Eliquis  placed: Spoke with Dr. Sheena of cardiology who deferred to neurosurgery for immediate holding of the patient's Eliquis  but recommended against cessation of Eliquis  use until she can follow-up outpatient with her cardiologist.  Signout given to Dr. Doretha at 1500 to follow-up results of CT imaging, reassess the patient, ultimate disposition pending results of reassessment and diagnostics.     Final diagnoses:  Fall, initial encounter  SDH (subdural hematoma) [facility])    ED Discharge Orders     None          Jerrol Agent, MD 06/06/24 1438  "

## 2024-06-06 NOTE — Progress Notes (Signed)
 Patient ID: Amber Stephenson, female   DOB: 1945/09/10, 79 y.o.   MRN: [id number] CT scan reviewed minimal interhemispheric subdural 2 mm in a patient on Eliquis .  Had a recent fall took her dose last night.  This is inconsequential as long as it does not reexpand so repeat the CT scan in 8 hours and if that is stable will would recommend holding the Eliquis  for a minimum of 72 hours.  Would also touch base with cardiology if she is on it for atrial fibrillation but she is having frequent falls that may be a contraindication to continuing the Eliquis .

## 2024-06-07 ENCOUNTER — Ambulatory Visit: Payer: Self-pay

## 2024-06-07 ENCOUNTER — Ambulatory Visit (HOSPITAL_COMMUNITY)

## 2024-06-07 ENCOUNTER — Other Ambulatory Visit: Payer: Self-pay

## 2024-06-07 MED ORDER — METHOCARBAMOL 1000 MG PO TABS: 1000.0000 mg | ORAL_TABLET | Freq: Four times a day (QID) | ORAL | 5 refills | Status: AC | PRN

## 2024-06-07 NOTE — Telephone Encounter (Signed)
 FYI Only or Action Required?: Action required by provider: clinical question for provider.  Patient was last seen in primary care on 04/19/2024 by Lavell Bari LABOR, FNP.  Called Nurse Triage reporting Hyperglycemia.  Symptoms began today.  Interventions attempted: Nothing.  Symptoms are: unchanged. States glucometer reading high. Asked if she had taken her insulin  and she said no, that will make it higher. Explained to pt. Her insulin  will bring down her blood sugar. Did not take insulin  yesterday because she was in ED after a fall.I have a bleed in my brain. Lives with husband. Declines OV. Please advise pt. States she will take her insulin  and recheck level in 1 hour.  Triage Disposition: Call PCP Now  Patient/caregiver understands and will follow disposition?: Yes      Copied from CRM [id number]. Topic: Clinical - Red Word Triage >> Jun 07, 2024  8:34 AM Diannia H wrote: Red Word that prompted transfer to Nurse Triage: Patient feel the other day went to the ER also broke her nose, they did xrays and discovered she had bleeding in the brain. She stated she was sent home yesterday day and all last night her sugar was over 400 its was reading on high all night. Reason for Disposition  Blood glucose > 400 mg/dL (77.7 mmol/L)  Answer Assessment - Initial Assessment Questions 1. BLOOD GLUCOSE: What is your blood glucose level?      high 2. ONSET: When did you check the blood glucose?     today 3. USUAL RANGE: What is your glucose level usually? (e.g., usual fasting morning value, usual evening value)     125 4. KETONES: Do you check for ketones (urine or blood test strips)? If Yes, ask: What does the test show now?      no 5. TYPE 1 or 2:  Do you know what type of diabetes you have?  (e.g., Type 1, Type 2, Gestational; doesn't know)      Type 1  6. INSULIN : Do you take insulin ? What type of insulin (s) do you use? What is the mode of delivery? (syringe, pen;  injection or pump)?      yes 7. DIABETES PILLS: Do you take any pills for your diabetes? If Yes, ask: Have you missed taking any pills recently?     no 8. OTHER SYMPTOMS: Do you have any symptoms? (e.g., fever, frequent urination, difficulty breathing, dizziness, weakness, vomiting)     achy 9. PREGNANCY: Is there any chance you are pregnant? When was your last menstrual period?     no  Protocols used: Diabetes - High Blood Sugar-A-AH

## 2024-06-09 ENCOUNTER — Ambulatory Visit (HOSPITAL_COMMUNITY)

## 2024-06-09 ENCOUNTER — Telehealth: Payer: Self-pay | Admitting: Cardiology

## 2024-06-09 ENCOUNTER — Telehealth: Payer: Self-pay

## 2024-06-09 DIAGNOSIS — I480 Paroxysmal atrial fibrillation: Secondary | ICD-10-CM

## 2024-06-09 NOTE — Telephone Encounter (Signed)
 Patient thinks she needs to increase mounjaro  to 15.  She feels her appetite coming back and has not noticed any addition weight loss.

## 2024-06-09 NOTE — Telephone Encounter (Signed)
 Pt c/o medication issue:  1. Name of Medication: Eliquis    2. How are you currently taking this medication (dosage and times per day)? As prescribed   3. Are you having a reaction (difficulty breathing--STAT)? No   4. What is your medication issue? Pt had a bad fall and went to ED. They advised her to contact cardiologist about discontinuing eliquis . Please advise

## 2024-06-10 ENCOUNTER — Other Ambulatory Visit: Payer: Self-pay | Admitting: Family Medicine

## 2024-06-10 ENCOUNTER — Telehealth: Payer: Self-pay

## 2024-06-10 MED ORDER — TIRZEPATIDE 15 MG/0.5ML ~~LOC~~ SOAJ: 15.0000 mg | SUBCUTANEOUS | 3 refills | Status: AC

## 2024-06-10 NOTE — Telephone Encounter (Signed)
 Pt called back reporting that the bottle of medication wasted because she accidentally dropped it. She says she is still experiencing symptoms in her right ear. Pt wants to know if this can be called in for her without having an appt?

## 2024-06-10 NOTE — Addendum Note (Signed)
 Addended by: MAGDALINE NEEDLE on: 06/10/2024 09:09 AM   Modules accepted: Orders

## 2024-06-10 NOTE — Telephone Encounter (Signed)
 Patient called concerned about elevated glucose levels.  See media tab for dexcom resport.  She says her glucose was 280 this morning and she is taking 20 units of inulin.

## 2024-06-10 NOTE — Telephone Encounter (Signed)
 Spoke with patient and gave her provider directions.

## 2024-06-10 NOTE — Telephone Encounter (Signed)
 Spoke with the patient and advised on recommendations from Dr. Shlomo. Referral has been placed. Will send message to scheduling for an appointment.

## 2024-06-13 MED ORDER — CIPROFLOXACIN-HYDROCORTISONE 0.2-1 % OT SUSP: 3.0000 [drp] | Freq: Two times a day (BID) | OTIC | 0 refills | Status: DC

## 2024-06-13 NOTE — Telephone Encounter (Signed)
 I resent cipro -HC ear drops to pharmacy.

## 2024-06-13 NOTE — Addendum Note (Signed)
 Addended by: LAVELL LYE A on: 06/13/2024 10:14 AM   Modules accepted: Orders

## 2024-06-14 ENCOUNTER — Ambulatory Visit (HOSPITAL_COMMUNITY): Attending: Orthopedic Surgery

## 2024-06-14 DIAGNOSIS — R2681 Unsteadiness on feet: Secondary | ICD-10-CM | POA: Diagnosis present

## 2024-06-14 DIAGNOSIS — M6281 Muscle weakness (generalized): Secondary | ICD-10-CM | POA: Insufficient documentation

## 2024-06-14 NOTE — Therapy (Signed)
 " OUTPATIENT PHYSICAL THERAPY LOWER EXTREMITY TREATMENT  Patient Name: Amber Stephenson MRN: [id number] DOB:January 26, 1946, 80 y.o., female Today's Date: 06/14/2024  END OF SESSION:  PT End of Session - 06/14/24 1330     Visit Number 11    Number of Visits 18    Date for Recertification  07/29/24    Authorization Type Medicare part A and B, with [employer] for life secondary    Authorization Time Period no authorization required    Progress Note Due on Visit 18    PT Start Time 1330    PT Stop Time 1412    PT Time Calculation (min) 42 min    Equipment Utilized During Treatment Gait belt    Activity Tolerance Patient tolerated treatment well    Behavior During Therapy WFL for tasks assessed/performed               Past Medical History:  Diagnosis Date   Allergic rhinitis 02/24/2019   Ambulatory dysfunction 04/23/2020   Atrial fibrillation with RVR (HCC) 05/19/2017   Back pain/sacroiliitis--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   Benign paroxysmal positional vertigo due to bilateral vestibular disorder 05/20/2019   Bipolar 1 disorder (HCC) 01/23/2015   with GAD, benzo dependence   CAD (coronary artery disease)    Nonobstructive; Managed by Dr. Charls   Cardiomegaly 01/12/2018   CHF (congestive heart failure) 06/08/2022   Chronic back pain 01/04/2015   Chronic constipation 04/25/2020   Chronic diastolic heart failure (HCC) 05/30/2015   Chronic pain syndrome 08/22/2019   back pain, sacroiliitis   Chronic post-traumatic stress disorder (PTSD) 12/06/2020   Diabetic neuropathy (HCC) 02/06/2016   Dyslipidemia 09/24/2020   Essential hypertension    Functional diarrhea 10/26/2020   Herpes genitalis in women 07/16/2015   History of adenomatous polyp of colon 05/21/2019   Overview:   03/31/17: Colonoscopy: nonadvanced adenoma, microscopic colitis, f/u 5 yrs, Murphy/GAP   History  of radiation therapy    Endometrial- 02/18/23-03/31/23-Dr. Lynwood Kinard   Hypotension 09/01/2022   Insomnia 01/23/2015   Metabolic acidosis 01/12/2023   Migraine headache with aura 02/12/2016   Myofascial pain dysfunction syndrome 08/22/2019   Non-alcoholic fatty liver disease 01/12/2018   OSA (obstructive sleep apnea) 02/24/2019   10/09/2018 - HST  - AHI 40.6    Osteopenia 12/06/2020   Rx alendronate  35 mg.   Pinguecula 12/06/2020   Presbyopia 12/06/2020   Pulmonary hypertension    RLS (restless legs syndrome) 04/27/2015   RSV (respiratory syncytial virus infection) 06/10/2023   Tremor, essential 12/11/2021   Type II diabetes mellitus (HCC) 09/24/2020   Past Surgical History:  Procedure Laterality Date   BREAST REDUCTION SURGERY     COLONOSCOPY  2018   6 mm cecal tubular adenoma, sigmoid diverticulosis, random colon biopsies consistent with microscopic colitis   COLONOSCOPY N/A 01/22/2024   Procedure: COLONOSCOPY;  Surgeon: Eartha Angelia Sieving, MD;  Location: [facility];  Service: Gastroenterology;  Laterality: N/A;   COLONOSCOPY N/A 02/23/2024   Procedure: COLONOSCOPY;  Surgeon: Eartha Angelia Sieving, MD;  Location: [facility];  Service: Gastroenterology;  Laterality: N/A;   ESOPHAGOGASTRODUODENOSCOPY N/A 01/22/2024   Procedure: EGD (ESOPHAGOGASTRODUODENOSCOPY);  Surgeon: Eartha Angelia, Sieving, MD;  Location: [facility];  Service: Gastroenterology;  Laterality: N/A;   ESOPHAGOGASTRODUODENOSCOPY N/A 02/23/2024   Procedure: EGD (ESOPHAGOGASTRODUODENOSCOPY);  Surgeon: Eartha Angelia, Sieving, MD;  Location: [facility];  Service: Gastroenterology;  Laterality: N/A;   EYE SURGERY Right    cateracts   HAMMER TOE SURGERY     LEFT HEART CATH AND CORONARY ANGIOGRAPHY N/A 02/03/2018   Procedure: LEFT HEART CATH AND CORONARY ANGIOGRAPHY;  Surgeon: Jordan, Peter M, MD;  [facility];  Service: Cardiovascular;  Laterality: N/A;   REVERSE SHOULDER  ARTHROPLASTY Right 08/19/2019   Procedure: REVERSE SHOULDER ARTHROPLASTY;  Surgeon: Kay Kemps, MD;  Location: [facility];  Service: Orthopedics;  Laterality: Right;  interscalene block   SHOULDER SURGERY Right    I BROKE MY SHOUDLER   THIGH SURGERY     TO REMOVE A TUMOR    Patient Active Problem List   Diagnosis Date Noted   Muscle spasm of back 05/18/2024   Radiation induced neuropathy 03/11/2024   Paroxysmal nerve pain 03/11/2024   RLQ abdominal pain 02/17/2024   Acute on chronic diastolic heart failure (HCC) 02/15/2024   Arcus senilis 02/04/2024   Benign neoplasm of skin 02/04/2024   Depressed bipolar I disorder in full remission 02/04/2024   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   Osteopenia 02/04/2024   Pain of foot 02/04/2024   Pain of right breast 02/04/2024   Phase of life problem 02/04/2024   Acute gastric ulcer without hemorrhage or perforation 01/23/2024   Hematochezia 01/21/2024   Acute anemia 01/21/2024   ABLA (acute blood loss anemia) 01/19/2024   Essential hypertension 01/07/2024   Rectal bleeding 10/07/2023   Nocturnal enuresis 08/25/2023   Hyponatremia 06/10/2023   Abdominal pain 06/10/2023   Diarrhea 06/10/2023   Candidiasis of mouth 05/15/2023   Type 2 diabetes mellitus with hyperglycemia, with long-term current use of insulin  (HCC) 02/12/2023   Long term (current) use of oral hypoglycemic drugs 02/12/2023   Primary hypertension 01/14/2023   Endometrial cancer (HCC) 01/12/2023   Vulvar itching 12/22/2022   Burning with urination 11/13/2022   Vitamin B12 deficiency 11/09/2022   Frequent falls 09/01/2022   AKI (acute kidney injury) 06/27/2022   Hemorrhoids 03/11/2022   Microalbuminuria 01/21/2022   Tremor, essential 12/11/2021   Chronic bilateral low back pain with right-sided sciatica 07/19/2021   Vaginal itching 04/16/2021   Sensory ataxia 03/28/2021   Elevated troponin 12/14/2020   Chronic post-traumatic stress disorder (PTSD)  12/06/2020   Senile corneal changes 12/06/2020   Postmenopausal bleeding 12/06/2020   Acquired hammer toe 12/06/2020   Opioid dependence 12/06/2020   Hypermetropia 12/06/2020   Generalized weakness 12/06/2020   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   Generalized anxiety disorder 12/06/2020   Functional diarrhea 10/26/2020   Insulin  dependent type 2 diabetes mellitus (HCC) 09/24/2020   Chronic respiratory failure with hypoxia (HCC) 04/25/2020   Chronic constipation 04/25/2020   Chronic pain syndrome 08/22/2019   CAD (coronary artery disease)    Hypocalcemia    S/P shoulder replacement, 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   Mixed hyperlipidemia 05/20/2019   Vertigo 04/25/2019   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   OSA (obstructive sleep apnea) 02/24/2019   At risk for adverse drug event 07/06/2018   Non-alcoholic fatty liver disease 01/12/2018   Mild intermittent asthma 01/12/2018   Senile osteoporosis 12/15/2017   Atrial fibrillation with RVR (HCC) 05/19/2017   Migraine headache with aura 02/12/2016   Diabetic neuropathy (HCC) 02/06/2016   Bipolar 1 disorder (HCC) 10/04/2015   Major depressive disorder 10/03/2015   Herpes genitalis in women 07/16/2015   Long term current use of anticoagulant 05/30/2015   Chronic heart failure with preserved ejection fraction (HFpEF) (HCC) 05/30/2015   RLS (restless legs syndrome)  04/27/2015   Insomnia 01/23/2015   Chronic atrial fibrillation with RVR (HCC) 01/04/2015    PCP: [name], MD  REFERRING PROVIDER: Margrette Taft BRAVO, MD   REFERRING DIAG: Gait disturbance, Contusion of right knee, initial encounter, Primary osteoarthritis of right knee   THERAPY DIAG:  Unsteadiness on feet  Muscle weakness (generalized)  Rationale for Evaluation and Treatment: Rehabilitation  ONSET DATE: November 2024  SUBJECTIVE:   SUBJECTIVE STATEMENT: Patient reports that she has fallen twice since her last appointment. Her last fall was the worst  of the two as she woke up at night and was walking to the bathroom and fell flat on her face. She does not recall the other fall. She is unable to recall when these falls occurred. She is feeling stronger today.   EVAL: Patient reports that her balance is her primary concern as her right knee has gotten better. She feels that her balance has been getting worse since November 2024. She has tried using a cane and a walker, but she does not feel steady or safe using an assistive device. She is primarily a furniture walker for balance. She has fallen 3-4 times in the past 6 months. She notes that she has had multiple colonoscopies over the previous months due to abdominal pain. She has been having a lot of pain over the past few days and she is scheduled for another colonoscopy in December.   PERTINENT HISTORY: Atrial fibrillation, CHF, diabetic neuropathy, osteoporosis, bipolar 1 disorder, asthma, PTSD, anxiety, and history of cancer PAIN:  Are you having pain? Yes: NPRS scale: 0/10 Pain location: back  Pain description: sharp Aggravating factors: walking Relieving factors: rest and elevation  PRECAUTIONS: Fall  RED FLAGS: None   WEIGHT BEARING RESTRICTIONS: No  FALLS:  Has patient fallen in last 6 months? Yes. Number of falls 3-4  LIVING ENVIRONMENT: Lives with: lives with their spouse Lives in: House/apartment Stairs: Yes: Internal: 1 flight steps; on right going up; patient avoids these steps due to difficulty and instability Has following equipment at home: Quad cane small base, Walker - 4 wheeled, shower chair, and Grab bars  OCCUPATION: retired  PLOF: Independent  PATIENT GOALS: be able to go shopping and improved safety  NEXT MD VISIT: none scheduled  OBJECTIVE:  Note: Objective measures were completed at Evaluation unless otherwise noted.  DIAGNOSTIC FINDINGS: 01/29/24 right knee x-ray IMPRESSION: Moderate prepatellar soft tissue swelling is noted suggesting traumatic  injury. No fracture or dislocation is noted.  PATIENT SURVEYS:  ABC scale: The Activities-Specific Balance Confidence (ABC) Scale 0% 10 20 30  40 50 60 70 80 90 100% No confidence<->completely confident  How confident are you that you will not lose your balance or become unsteady when you . . .   Date tested 03/28/24 06/14/24  Walk around the house 20% 100%  2. Walk up or down stairs 10% 0%  3. Bend over and pick up a slipper from in front of a closet floor 10% 100%  4. Reach for a small can off a shelf at eye level 80% 100%  5. Stand on tip toes and reach for something above your head 40% 0%  6. Stand on a chair and reach for something 0% 0%  7. Sweep the floor 0% 100%  8. Walk outside the house to a car parked in the driveway 50% 100%  9. Get into or out of a car 60% 100%  10. Walk across a parking lot to the mall 0% 0%  11. Walk up or down a ramp 40% 100%  12. Walk in a crowded mall where people rapidly walk past you 10% 100%  13. Are bumped into by people as you walk through the mall 0% 0%  14. Step onto or off of an escalator while you are holding onto the railing 40% 100%  15. Step onto or off an escalator while holding onto parcels such that you cannot hold onto the railing 0% 0%  16. Walk outside on icy sidewalks 0% 0%  Total: #/16 22.5%   56.25%      05/12/24: ABC Scale: 450 / 1600 = 28.1 %  COGNITION: Overall cognitive status: Within functional limits for tasks assessed     SENSATION: Light touch: Impaired  and increased sensitivity to light touch in bilateral lower 1/3 of the tibia Patient reports numbness or tingling in her feet and lower legs.   EDEMA:  No edema observed  POSTURE: rounded shoulders, forward head, and flexed trunk   LOWER EXTREMITY ROM: unable to assess secondary to pain   LOWER EXTREMITY MMT: high pain severity and irritability with MMT which limited full assessment   MMT Right eval Left eval  Hip flexion 4-/5 3+/5  Hip extension    Hip  abduction    Hip adduction    Hip internal rotation    Hip external rotation    Knee flexion    Knee extension    Ankle dorsiflexion 4-/5 4-/5  Ankle plantarflexion    Ankle inversion    Ankle eversion     (Blank rows = not tested)  FUNCTIONAL TESTS: will be completed a following appointments, as able 5 times sit to stand: 16 seconds on 04/04/24, BUE push up from arm rests Timed up and go (TUG): 21 seconds on 04/04/24, no AD 2 minute walk test: 194 ft on 04/04/24, no AD, pt SOB 05/12/24: TUG: 39.06 seconds with walker, 17.5 seconds without walker 5TSTS: 11.26 seconds  06/14/24: TUG: 19.79 seconds 5 times sit to stand: 15.13 seconds without arm rests 2 minute walk test: 167 feet with CGA (standing rest breaks at 55 seconds and 1:30, respectively, secondary to reported back pain)    GAIT: Assistive device utilized: None Level of assistance: SBA Comments: very unsteady with ambulation, required external support from the wall and other objects for safety                                                                                                                                TREATMENT DATE:                                   06/14/24 EXERCISE LOG  Exercise Repetitions and Resistance Comments  Goal assessment  TUG, 5x STS, 2 minute walk test, and ABC scale    Seated hip ADD isometric  2 minutes w/ 5 second hold  Seated hip ADD isometric  2 minutes w/ 5 second hold    Seated hip ABD isometric  2 minutes w/ 5 second hold  With therapist resistance  LAQ  20 reps each    Patient education Progress with physical therapy, objective findings, safety, utilizing a AD for mobility, and POC     Blank cell = exercise not performed today   05/19/24: - STS no HHA, cueing for upright posture, eccentric lowering, increased fatigue rep 8, 10 complete - Toe tapping 6in 2x 10, required HHA 1st set - Shoulder extensions/rows, 2 set of 10 reps, GTB at chest level on web slide, pt cued for  sequencing and SBA for safety - Step up and over 4in fatigue at rep 8 with 2 HHA - Resisted walking with ankle weight 4# including monster diagonal walking, marching, lateral and butt kicks  05/17/24   - STS no HHA, cueing for upright posture, eccentric lowering - Toe tapping 6in 2x 10, required HHA 1st set - Shoulder extensions/rows, 2 set of 10 reps, GTB at chest level on web slide, pt cued for sequencing and SBA for safety - Resisted walking with ankle weight 3# including monster diagonal walking, marching, lateral and butt kicks - Discussed complaince with HEP, given new handout  PATIENT EDUCATION:  Education details: POC, prognosis, pain, progress with physical therapy, and following up with her referring physician Person educated: Patient Education method: Explanation Education comprehension: verbalized understanding  HOME EXERCISE PROGRAM: Access Code: [email or web address] URL: [email or web address] Date: 04/04/2024 Prepared by: Rosaria Powell-Butler  Exercises - Sit to Stand  - 2 x daily - 7 x weekly - 2 sets - 10 reps - Seated Heel Toe Raises  - 2 x daily - 7 x weekly - 2 sets - 10 reps - Seated Long Arc Quad  - 2 x daily - 7 x weekly - 2 sets - 10 reps - Standing Hip Abduction with Counter Support  - 2 x daily - 7 x weekly - 2 sets - 10 reps  Access Code: [email or web address] URL: [email or web address] Date: 05/04/2024 - Supine Bridge  - 2 x daily - 7 x weekly - 10 reps - Small Range Straight Leg Raise  - 2 x daily - 7 x weekly - 10 reps - Sidelying Hip Abduction  - 2 x daily - 7 x weekly - 10 reps  ASSESSMENT:  CLINICAL IMPRESSION:  Patient presented to treatment reporting multiple falls and visits to the emergency department. She reported no pain or discomfort, but significant bruising on her face was observed. She was able to demonstrate improved score on her ABC scale and timed up and go time. However, her two minute walk test time decreased and she had to stop  multiple times due to increased low back pain.She reported feeling tired upon the conclusion of treatment.  Recommend that she continue with her current plan of care to address her remaining impairments to maximize her safety and functional mobility.   EVAL: Patient is a 79 y.o. female who was seen today for physical therapy evaluation and treatment for unsteadiness on her feet and history of falling. She is a high fall risk as evidenced by her gait mechanics and her history of falling. Her full objective assessment was unable to be completed due to her high pain severity and irritability. She requested to leave early secondary to her pain levels. Recommend that she continue with skilled physical therapy to address her impairments to maximize her safety and functional mobility.  OBJECTIVE IMPAIRMENTS: Abnormal gait, decreased activity tolerance, decreased balance, decreased knowledge of use of DME, decreased mobility, difficulty walking, decreased strength, postural dysfunction, and pain.   ACTIVITY LIMITATIONS: carrying, bending, standing, stairs, transfers, and locomotion level  PARTICIPATION LIMITATIONS: meal prep, cleaning, laundry, shopping, and community activity  PERSONAL FACTORS: Past/current experiences, Time since onset of injury/illness/exacerbation, and 3+ comorbidities: Atrial fibrillation, CHF, diabetic neuropathy, osteoporosis, bipolar 1 disorder, asthma, PTSD, anxiety, and history of cancer are also affecting patient's functional outcome.   REHAB POTENTIAL: Fair    CLINICAL DECISION MAKING: Unstable/unpredictable  EVALUATION COMPLEXITY: High   GOALS: Goals reviewed with patient? Yes  LONG TERM GOALS: Target date: 04/25/24  Patient will be independent with her HEP.  Baseline: patient reports that she is doing her HEP twice per day Goal status: MET  2.  Patient will improve her ABC by at least 10% for improved perceived function with her daily activities.  Baseline:  56.25% (33.75% improvement) Goal status: MET  3.  Patient will be able to complete the timed up and go in 12 seconds or less to reduce her fall risk.  Baseline:  Goal status: Progressing  4.  Patient will be able to demonstrate proper utilization of the least restrictive assistive device to improve her community mobility.  Baseline: (performs worse on TUG 05/12/24 with RW Goal status: Progressing   PLAN:  PT FREQUENCY: 2x/week  PT DURATION: 4 weeks  PLANNED INTERVENTIONS: [id number]- PT Re-evaluation, 97750- Physical Performance Testing, 97110-Therapeutic exercises, 97530- Therapeutic activity, [id number]- Neuromuscular re-education, 97535- Self Care, [id number]- Manual therapy, [phone]- Gait training, Patient/Family education, Balance training, Stair training, Cryotherapy, and Moist heat  PLAN FOR NEXT SESSION: gait training with LRAD, balance interventions, lower extremity strengthening. Follow up if using RW, completing HEP and if she's had any falls.  Extending for 5 weeks, 2 visits per week PN visit 8 (05/12/24).  Discussed walking program for activity tolerance with RW.  Lacinda Fass, PT, DPT  2:38 PM, 06/14/2024   "

## 2024-06-16 ENCOUNTER — Encounter: Payer: Self-pay | Admitting: Family Medicine

## 2024-06-16 ENCOUNTER — Ambulatory Visit: Admitting: Family Medicine

## 2024-06-16 VITALS — BP 125/80 | HR 64 | Temp 97.6°F | Ht 61.0 in | Wt 166.0 lb

## 2024-06-16 DIAGNOSIS — S065XAD Traumatic subdural hemorrhage with loss of consciousness status unknown, subsequent encounter: Secondary | ICD-10-CM

## 2024-06-16 DIAGNOSIS — R220 Localized swelling, mass and lump, head: Secondary | ICD-10-CM | POA: Diagnosis not present

## 2024-06-16 DIAGNOSIS — S065XAA Traumatic subdural hemorrhage with loss of consciousness status unknown, initial encounter: Secondary | ICD-10-CM

## 2024-06-16 DIAGNOSIS — W19XXXD Unspecified fall, subsequent encounter: Secondary | ICD-10-CM | POA: Diagnosis not present

## 2024-06-16 NOTE — Progress Notes (Signed)
 "    Subjective:  Patient ID: Amber Stephenson, female    DOB: [birth year], 79 y.o.   MRN: [id number]  Patient Care Team: [name], MD as PCP - General (Family Medicine) Shlomo Wilbert SAUNDERS, MD as PCP - Cardiology (Cardiology) Kay Lynwood SQUIBB, MD as Referring Physician (Orthopedic Surgery) Tasia Lung, MD as Consulting Physician (Psychiatry) Georjean Darice HERO, MD as Consulting Physician (Neurology) Shamleffer, Donell Cardinal, MD as Consulting Physician (Endocrinology) Charmayne Molly, MD as Consulting Physician (Ophthalmology) Roddie Bring, DPM as Consulting Physician (Podiatry) Shlomo Wilbert SAUNDERS, MD as Consulting Physician (Cardiology) Shlomo Wilbert SAUNDERS, MD as Consulting Physician (Cardiology) Dina Camie FORBES DEVONNA (Neurology) Neda Jennet LABOR, MD as Consulting Physician (Pulmonary Disease) Parrett, Madelin RAMAN, NP as Nurse Practitioner (Pulmonary Disease) Bertrum Rosina HERO, RN as [facility] Care Management (General Practice) Bertrum Rosina HERO, RN   Chief Complaint:  Hospitalization Follow-up (06/06/2024 (8 hours)/[place] [facility] at [facility]- fall)   HPI: Amber Stephenson is a 79 y.o. female presenting on 06/16/2024 for Hospitalization Follow-up (06/06/2024 (8 hours)/[place] [facility] at [facility]- fall)   Amber Stephenson is a 79 year old female who presents with concerns about nasal pain and swelling following a fall.  She experienced a fall at home in the middle of the night, landing on her nose. The fall occurred without any apparent reason. Following the fall, she was evaluated and was told there was no nasal fracture, though she remains concerned about the possibility due to persistent pain and swelling.   As a result of the fall, she was told she had a subdural hematoma, which was described as slight brain bleeding. A follow-up scan was performed, and she was told there was no change, after which she was discharged home. Since returning  home, she reports no new symptoms of confusion, weakness, or blurred vision,  She has been using Arnica gel for the swelling and reports a continuously runny nose, which causes pain when she blows it hard. She was instructed to hold her Eliquis  for three days following the fall and has since resumed it on January 8th. She has not experienced any abnormal bleeding or bruising since restarting the medication.  During the review of symptoms, she denies any new falls, weakness, or significant changes in her usual level of confusion.          Relevant past medical, surgical, family, and social history reviewed and updated as indicated.  Allergies and medications reviewed and updated. Data reviewed: Chart in Epic.   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   Benign paroxysmal positional vertigo due to bilateral vestibular disorder 05/20/2019   Bipolar 1 disorder (HCC) 01/23/2015   with GAD, benzo dependence   CAD (coronary artery disease)    Nonobstructive; Managed by Dr. Charls   Cardiomegaly 01/12/2018   CHF (congestive heart failure) 06/08/2022   Chronic back pain 01/04/2015   Chronic constipation 04/25/2020   Chronic diastolic heart failure (HCC) 05/30/2015   Chronic pain syndrome 08/22/2019   back pain, sacroiliitis   Chronic post-traumatic stress disorder (PTSD) 12/06/2020   Diabetic neuropathy (HCC) 02/06/2016   Dyslipidemia 09/24/2020   Essential hypertension    Functional diarrhea 10/26/2020   Herpes genitalis in women 07/16/2015  History of adenomatous polyp of colon 05/21/2019   Overview:   03/31/17: Colonoscopy: nonadvanced adenoma, microscopic colitis, f/u 5 yrs, [facility]/GAP   History of radiation  therapy    Endometrial- 02/18/23-03/31/23-Dr. Lynwood Kinard   Hypotension 09/01/2022   Insomnia 01/23/2015   Metabolic acidosis 01/12/2023   Migraine headache with aura 02/12/2016   Myofascial pain dysfunction syndrome 08/22/2019   Non-alcoholic fatty liver disease 01/12/2018   OSA (obstructive sleep apnea) 02/24/2019   10/09/2018 - HST  - AHI 40.6    Osteopenia 12/06/2020   Rx alendronate  35 mg.   Pinguecula 12/06/2020   Presbyopia 12/06/2020   Pulmonary hypertension    RLS (restless legs syndrome) 04/27/2015   RSV (respiratory syncytial virus infection) 06/10/2023   Tremor, essential 12/11/2021   Type II diabetes mellitus (HCC) 09/24/2020    Past Surgical History:  Procedure Laterality Date   BREAST REDUCTION SURGERY     COLONOSCOPY  2018   6 mm cecal tubular adenoma, sigmoid diverticulosis, random colon biopsies consistent with microscopic colitis   COLONOSCOPY N/A 01/22/2024   Procedure: COLONOSCOPY;  Surgeon: Eartha Angelia Sieving, MD;  Location: [facility];  Service: Gastroenterology;  Laterality: N/A;   COLONOSCOPY N/A 02/23/2024   Procedure: COLONOSCOPY;  Surgeon: Eartha Angelia Sieving, MD;  Location: [facility];  Service: Gastroenterology;  Laterality: N/A;   ESOPHAGOGASTRODUODENOSCOPY N/A 01/22/2024   Procedure: EGD (ESOPHAGOGASTRODUODENOSCOPY);  Surgeon: Eartha Angelia, Sieving, MD;  Location: [facility];  Service: Gastroenterology;  Laterality: N/A;   ESOPHAGOGASTRODUODENOSCOPY N/A 02/23/2024   Procedure: EGD (ESOPHAGOGASTRODUODENOSCOPY);  Surgeon: Eartha Angelia, Sieving, MD;  Location: [facility];  Service: Gastroenterology;  Laterality: N/A;   EYE SURGERY Right    cateracts   HAMMER TOE SURGERY     LEFT HEART CATH AND CORONARY ANGIOGRAPHY N/A 02/03/2018   Procedure: LEFT HEART CATH AND CORONARY ANGIOGRAPHY;  Surgeon: Jordan, Peter M, MD;  Location: [facility] INVASIVE CV LAB;  Service: Cardiovascular;  Laterality: N/A;   REVERSE SHOULDER ARTHROPLASTY  Right 08/19/2019   Procedure: REVERSE SHOULDER ARTHROPLASTY;  Surgeon: Kay Kemps, MD;  Location: [facility];  Service: Orthopedics;  Laterality: Right;  interscalene block   SHOULDER SURGERY Right    I BROKE MY SHOUDLER   THIGH SURGERY     TO REMOVE A TUMOR     Social History   Socioeconomic History   Marital status: Married    Spouse name: Dwight   Number of children: 3   Years of education: 15   Highest education level: Associate degree: academic program  Occupational History   Occupation: Retired    Comment: chief financial officer  Tobacco Use   Smoking status: Never   Smokeless tobacco: Never  Vaping Use   Vaping status: Never Used  Substance and Sexual Activity   Alcohol  use: Not Currently    Alcohol /week: 0.0 standard drinks of alcohol    Drug use: No   Sexual activity: Not Currently    Partners: Male    Birth control/protection: Post-menopausal  Other Topics Concern   Not on file  Social History Narrative   Lives at home with husband.    They have 3 children who live away - [place] , [place] , and [place] .   She is from [place]  and most of her family lives there.   Her husbands's family live nearby   Right handed.   Social Drivers of Health   Tobacco Use: Low Risk (06/16/2024)   Patient History    Smoking Tobacco Use: Never    Smokeless Tobacco Use: Never  Passive Exposure: Not on file  Financial Resource Strain: Low Risk (08/13/2023)   Overall Financial Resource Strain (CARDIA)    Difficulty of Paying Living Expenses: Not hard at all  Food Insecurity: No Food Insecurity (04/08/2024)   Epic    Worried About Programme Researcher, Broadcasting/film/video in the Last Year: Never true    Ran Out of Food in the Last Year: Never true  Transportation Needs: No Transportation Needs (04/08/2024)   Epic    Lack of Transportation (Medical): No    Lack of Transportation (Non-Medical): No  Physical Activity: Insufficiently Active (08/13/2023)   Exercise Vital Sign    Days of Exercise per Week: 3  days    Minutes of Exercise per Session: 20 min  Stress: No Stress Concern Present (08/13/2023)   [employer] of Occupational Health - Occupational Stress Questionnaire    Feeling of Stress : Only a little  Social Connections: Socially Integrated (02/15/2024)   Social Connection and Isolation Panel    Frequency of Communication with Friends and Family: More than three times a week    Frequency of Social Gatherings with Friends and Family: Once a week    Attends Religious Services: More than 4 times per year    Active Member of Clubs or Organizations: Yes    Attends Banker Meetings: More than 4 times per year    Marital Status: Married  Catering Manager Violence: Not At Risk (04/08/2024)   Epic    Fear of Current or Ex-Partner: No    Emotionally Abused: No    Physically Abused: No    Sexually Abused: No  Depression (PHQ2-9): Low Risk (05/17/2024)   Depression (PHQ2-9)    PHQ-2 Score: 0  Alcohol  Screen: Low Risk (08/13/2023)   Alcohol  Screen    Last Alcohol  Screening Score (AUDIT): 0  Housing: Low Risk (04/08/2024)   Epic    Unable to Pay for Housing in the Last Year: No    Number of Times Moved in the Last Year: 0    Homeless in the Last Year: No  Utilities: Not At Risk (04/08/2024)   Epic    Threatened with loss of utilities: No  Health Literacy: Adequate Health Literacy (08/13/2023)   B1300 Health Literacy    Frequency of need for help with medical instructions: Never    Outpatient Encounter Medications as of 06/16/2024  Medication Sig   acetaminophen  (TYLENOL ) 325 MG tablet Take 2 tablets (650 mg total) by mouth every 6 (six) hours as needed for mild pain (pain score 1-3) or fever (or Fever >/= 101).   ALPRAZolam  (XANAX ) 1 MG tablet Take 1 mg by mouth at bedtime as needed for sleep.   ARIPiprazole  (ABILIFY ) 2 MG tablet Take 1 tablet (2 mg total) by mouth at bedtime. (Patient taking differently: Take 2 mg by mouth 2 (two) times daily.)   atorvastatin  (LIPITOR)  80 MG tablet Take 1 tablet (80 mg total) by mouth daily.   carbidopa -levodopa  (SINEMET ) 10-100 MG tablet Take 1 tablet by mouth 4 (four) times daily. For restless leg syndrome   Continuous Glucose Sensor (DEXCOM G7 SENSOR) MISC CHANGE SENSOR EVERY 10 DAYS   cyanocobalamin  (VITAMIN B12) 1000 MCG tablet Take 1 tablet (1,000 mcg total) by mouth daily.   diphenoxylate -atropine  (LOMOTIL ) 2.5-0.025 MG tablet TAKE 2 TABLETS BY MOUTH FOUR TIMES DAILY AS NEEDED FOR DIARRHEA OR LOOSE STOOLS   ELIQUIS  5 MG TABS tablet TAKE 1 TABLET TWICE A DAY   escitalopram  (LEXAPRO ) 10 MG tablet Take  10 mg by mouth daily.   ferrous sulfate  325 (65 FE) MG EC tablet Take 1 tablet (325 mg total) by mouth 2 (two) times daily.   furosemide  (LASIX ) 40 MG tablet Take 1 tablet (40 mg total) by mouth 2 (two) times daily.   gabapentin  (NEURONTIN ) 100 MG capsule Take 1 capsule (100 mg total) by mouth 3 (three) times daily.   hydrocortisone  (ANUSOL -HC) 2.5 % rectal cream Place rectally 2 (two) times daily.   hydrOXYzine  (VISTARIL ) 25 MG capsule Take 1 capsule (25 mg total) by mouth every 8 (eight) hours as needed for anxiety.   insulin  glargine (LANTUS  SOLOSTAR) 100 UNIT/ML Solostar Pen Inject 10 Units into the skin at bedtime. (Patient taking differently: Inject 8 Units into the skin at bedtime.)   insulin  lispro (HUMALOG  KWIKPEN) 100 UNIT/ML KwikPen Inject 0-10 Units into the skin 2 (two) times daily before a meal. Sliding scale Short acting Humalog  insulin  per sliding scale 0-10 ---:-- Insulin  injection 0-10 Units 0-10 Units Subcutaneous, 3 times daily with meals CBG 70 - 120: 0 unit CBG 121 - 150: 0 unit  CBG 151 - 200: 1 unit CBG 201 - 250: 2 units CBG 251 - 300: 4 units CBG 301 - 350: 6 units  CBG 351 - 400: 8 units  CBG > 400: 10 units   ketoconazole  (NIZORAL ) 2 % cream Apply 1 Application topically daily.   lidocaine  (LIDODERM ) 5 % Place 1 patch onto the skin daily. Remove & Discard patch within 12 hours or as directed by MD    lidocaine  (XYLOCAINE ) 5 % ointment Apply 1 Application topically 4 (four) times daily as needed. For foot and ankle nerve pain-  can use up to 4x/day as needed   losartan  (COZAAR ) 25 MG tablet Take 25 mg by mouth daily.   MAGNESIUM  PO Take 1 tablet by mouth daily.   Methocarbamol  1000 MG TABS Take 1,000 mg by mouth every 6 (six) hours as needed.   metoprolol  tartrate (LOPRESSOR ) 50 MG tablet Take 1 tablet (50 mg total) by mouth 2 (two) times daily.   Misc. Devices (3-IN-1 BEDSIDE TOILET) MISC Length of need 99 months M17.11 Osteoarthritis right knee / multiple falls / high risk fall   nystatin  (MYCOSTATIN ) 100000 UNIT/ML suspension Take 5 mLs (500,000 Units total) by mouth 4 (four) times daily.   ondansetron  (ZOFRAN ) 4 MG tablet Take 1 tablet (4 mg total) by mouth every 8 (eight) hours as needed for nausea or vomiting. For cancer related nausea   OXcarbazepine  (TRILEPTAL ) 150 MG tablet Take 1 tablet (150 mg total) by mouth 2 (two) times daily.   oxyCODONE  (OXYCONTIN ) 20 mg 12 hr tablet Take 1 tablet (20 mg total) by mouth every 12 (twelve) hours. G89.3- chronic pain- fill 28 days after 04/23/2024   OXYCONTIN  20 MG 12 hr tablet Take 1 tablet (20 mg total) by mouth every 12 (twelve) hours. G89.3- chronic pain- dispense in 28 days   OXYCONTIN  20 MG 12 hr tablet Take 1 tablet (20 mg total) by mouth every 12 (twelve) hours. For chronic pain G89.3- chronic pain- due to lumbar stenosis- dispense now   pantoprazole  (PROTONIX ) 40 MG tablet Take 1 tablet (40 mg total) by mouth 2 (two) times daily.   potassium chloride  (KLOR-CON ) 10 MEQ tablet Take 1 tablet (10 mEq total) by mouth daily.   pregabalin  (LYRICA ) 25 MG capsule Take 1 capsule (25 mg total) by mouth 3 (three) times daily. Will start at at bedtime x 4 days then BID x  4 days, then TID- for nerve pain (Patient taking differently: Take 25 mg by mouth 2 (two) times daily.)   rOPINIRole  (REQUIP ) 2 MG tablet Take 1 tablet (2 mg total) by mouth at bedtime.    spironolactone  (ALDACTONE ) 25 MG tablet Take 0.5 tablets (12.5 mg total) by mouth daily.   tirzepatide  (MOUNJARO ) 15 MG/0.5ML Pen Inject 15 mg into the skin once a week.   vitamin C  (VITAMIN C ) 500 MG tablet Take 0.5 tablets (250 mg total) by mouth 2 (two) times daily.   Vitamin D , Cholecalciferol , 25 MCG (1000 UT) TABS Take 1,000 Units by mouth in the morning.   colchicine  0.6 MG tablet Take 1 tablet (0.6 mg total) by mouth every 12 (twelve) hours as needed (gout flare.). For mouth sores (Patient not taking: Reported on 06/16/2024)   diazepam  (VALIUM ) 2 MG tablet Take 2 tablets (4 mg total) by mouth daily. Don't give increase in Robaxin  until valium  completed- To break pain cycle (Patient not taking: Reported on 06/16/2024)   [DISCONTINUED] cephALEXin  (KEFLEX ) 500 MG capsule Take 500 mg by mouth 3 (three) times daily.   [DISCONTINUED] ciprofloxacin -hydrocortisone  (CIPRO  HC) OTIC suspension Place 3 drops into both ears 2 (two) times daily.   [DISCONTINUED] doxycycline  (VIBRA -TABS) 100 MG tablet Take 1 tablet (100 mg total) by mouth 2 (two) times daily. 1 po bid   [DISCONTINUED] fluconazole  (DIFLUCAN ) 150 MG tablet Take 1 tablet (150 mg total) by mouth every 3 (three) days.   Facility-Administered Encounter Medications as of 06/16/2024  Medication   bupivacaine -epinephrine  (MARCAINE  W/ EPI) 0.5% -1:200000 injection   cyanocobalamin  (VITAMIN B12) injection 1,000 mcg   denosumab  (PROLIA ) injection 60 mg    Allergies[1]  Pertinent ROS per HPI, otherwise unremarkable      Objective:  BP 125/80   Pulse 64   Temp 97.6 F (36.4 C)   Ht 5' 1 (1.549 m)   Wt 166 lb (75.3 kg)   SpO2 96%   BMI 31.37 kg/m    Wt Readings from Last 3 Encounters:  06/16/24 166 lb (75.3 kg)  05/18/24 162 lb 9.6 oz (73.8 kg)  05/17/24 162 lb (73.5 kg)    Physical Exam Vitals and nursing note reviewed.  Constitutional:      Appearance: She is ill-appearing (chronically ill).  HENT:     Head: Contusion  present.     Jaw: There is normal jaw occlusion.     Salivary Glands: Right salivary gland is not diffusely enlarged or tender. Left salivary gland is not diffusely enlarged or tender.     Comments: Significant ecchymosis to face, mainly on left side. Swelling to tip of nose, no rhinorrhea noted. No septal deviation noted.     Mouth/Throat:     Mouth: Mucous membranes are moist.     Pharynx: Oropharynx is clear.  Eyes:     Conjunctiva/sclera: Conjunctivae normal.     Pupils: Pupils are equal, round, and reactive to light.  Cardiovascular:     Rate and Rhythm: Normal rate and regular rhythm.     Heart sounds: Normal heart sounds.  Pulmonary:     Effort: Pulmonary effort is normal.     Breath sounds: Normal breath sounds.  Musculoskeletal:     Cervical back: Neck supple.     Right lower leg: No edema.     Left lower leg: No edema.  Skin:    General: Skin is warm and dry.     Capillary Refill: Capillary refill takes less than 2 seconds.  Findings: Bruising present.  Neurological:     General: No focal deficit present.     Mental Status: She is alert. Mental status is at baseline.     Gait: Gait abnormal (slow).  Psychiatric:        Mood and Affect: Mood normal.        Behavior: Behavior is slowed (baseline).        Thought Content: Thought content normal.        Judgment: Judgment normal.      Results for orders placed or performed during the hospital encounter of 06/06/24  CBC with Differential   Collection Time: 06/06/24 10:49 AM  Result Value Ref Range   WBC 5.7 4.0 - 10.5 K/uL   RBC 3.10 (L) 3.87 - 5.11 MIL/uL   Hemoglobin 9.0 (L) 12.0 - 15.0 g/dL   HCT 73.3 (L) 63.9 - 53.9 %   MCV 85.8 80.0 - 100.0 fL   MCH 29.0 26.0 - 34.0 pg   MCHC 33.8 30.0 - 36.0 g/dL   RDW 85.3 88.4 - 84.4 %   Platelets 181 150 - 400 K/uL   nRBC 0.0 0.0 - 0.2 %   Neutrophils Relative % 72 %   Neutro Abs 4.1 1.7 - 7.7 K/uL   Lymphocytes Relative 14 %   Lymphs Abs 0.8 0.7 - 4.0 K/uL    Monocytes Relative 10 %   Monocytes Absolute 0.5 0.1 - 1.0 K/uL   Eosinophils Relative 3 %   Eosinophils Absolute 0.2 0.0 - 0.5 K/uL   Basophils Relative 0 %   Basophils Absolute 0.0 0.0 - 0.1 K/uL   Immature Granulocytes 1 %   Abs Immature Granulocytes 0.03 0.00 - 0.07 K/uL  Comprehensive metabolic panel   Collection Time: 06/06/24 10:49 AM  Result Value Ref Range   Sodium 132 (L) 135 - 145 mmol/L   Potassium 3.9 3.5 - 5.1 mmol/L   Chloride 96 (L) 98 - 111 mmol/L   CO2 27 22 - 32 mmol/L   Glucose, Bld 131 (H) 70 - 99 mg/dL   BUN 12 8 - 23 mg/dL   Creatinine, Ser 9.39 0.44 - 1.00 mg/dL   Calcium  9.9 8.9 - 10.3 mg/dL   Total Protein 6.7 6.5 - 8.1 g/dL   Albumin  3.4 (L) 3.5 - 5.0 g/dL   AST 17 15 - 41 U/L   ALT <5 0 - 44 U/L   Alkaline Phosphatase 93 38 - 126 U/L   Total Bilirubin 0.6 0.0 - 1.2 mg/dL   GFR, Estimated >39 >39 mL/min   Anion gap 9 5 - 15  Protime-INR   Collection Time: 06/06/24 10:49 AM  Result Value Ref Range   Prothrombin Time 17.4 (H) 11.4 - 15.2 seconds   INR 1.4 (H) 0.8 - 1.2   *Note: Due to a large number of results and/or encounters for the requested time period, some results have not been displayed. A complete set of results can be found in Results Review.       Pertinent labs & imaging results that were available during my care of the patient were reviewed by me and considered in my medical decision making.  Assessment & Plan:  Brittish was seen today for hospitalization follow-up.  Diagnoses and all orders for this visit:  Fall, subsequent encounter  SDH (subdural hematoma) (HCC)  Nasal swelling       Traumatic subdural hematoma after fall Subdural hematoma measuring 2-3 mm in thickness without new increasing intracranial hemorrhage. No neurological changes  since the fall. Confusion and blurred vision are not new symptoms. No indication for repeat imaging as per ED notes and Up To Date guidelines. Eliquis  was held for three days post-fall and  restarted on January 8th without complications. No abnormal bleeding or bruising since restarting Eliquis . - Continue to monitor for neurological changes or worsening symptoms. - Attend upcoming cardiology appointment to discuss Eliquis  management.  Nasal soft tissue swelling after fall Moderate soft tissue swelling at the nose, particularly the left side, with no fracture. Swelling persists on the tip of the nose. No nasal drainage with halo formation, indicating no cerebrospinal fluid leak. No skull fracture noted. - Continue to monitor for changes in nasal swelling or drainage. - Report any changes or worsening symptoms, especially if nasal discharge forms a halo.          Continue all other maintenance medications.  Follow up plan: Return if symptoms worsen or fail to improve.   Continue healthy lifestyle choices, including diet (rich in fruits, vegetables, and lean proteins, and low in salt and simple carbohydrates) and exercise (at least 30 minutes of moderate physical activity daily).  Educational handout given for fall, head injury  The above assessment and management plan was discussed with the patient. The patient verbalized understanding of and has agreed to the management plan. Patient is aware to call the clinic if they develop any new symptoms or if symptoms persist or worsen. Patient is aware when to return to the clinic for a follow-up visit. Patient educated on when it is appropriate to go to the emergency department.   Rosaline Bruns, FNP-C [facility] [phone]     [1]  Allergies Allergen Reactions   Iodinated Contrast Media Anaphylaxis, Swelling and Other (See Comments)    Throat closes, swelling of throat and tongue   Iodine Anaphylaxis   Latex Other (See Comments)    Latex tape pulls skin with it   Tape Other (See Comments)    Pulls off the skin, if latex   Tizanidine  Other (See Comments)    Weakness, goofy, bad dreams   "

## 2024-06-17 ENCOUNTER — Other Ambulatory Visit: Payer: Self-pay

## 2024-06-17 NOTE — Patient Instructions (Signed)
 Visit Information  Thank you for taking time to visit with me today. Please don't hesitate to contact me if I can be of assistance to you before our next scheduled appointment.  Your next care management appointment is by telephone on 07/18/2024 at 1:45 PM   Telephone follow-up in 1 month  Please call the care guide team at [phone] if you need to cancel, schedule, or reschedule an appointment.   Please call the Suicide and Crisis Lifeline: 988 call the [place]  National Suicide Prevention Lifeline: 872-328-5162 or TTY: (519) 854-1041 TTY 864-267-5630) to talk to a trained counselor call 1-800-273-TALK (toll free, 24 hour hotline) call the [facility]: [phone] call 911 if you are experiencing a Mental Health or Behavioral Health Crisis or need someone to talk to.  Hendricks Her RN, BSN  [place] [facility]-Population Health RN Case Manager   Direct Dial [phone]

## 2024-06-17 NOTE — Patient Outreach (Signed)
 Complex Care Management   Visit Note  06/17/2024  Name:  Amber Stephenson MRN: [id number] DOB: [birth year]  Situation: Referral received for Complex Care Management related to Atrial Fibrillation I obtained verbal consent from Patient.  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   Benign paroxysmal positional vertigo due to bilateral vestibular disorder 05/20/2019   Bipolar 1 disorder (HCC) 01/23/2015   with GAD, benzo dependence   CAD (coronary artery disease)    Nonobstructive; Managed by Dr. Charls   Cardiomegaly 01/12/2018   CHF (congestive heart failure) 06/08/2022   Chronic back pain 01/04/2015   Chronic constipation 04/25/2020   Chronic diastolic heart failure (HCC) 05/30/2015   Chronic pain syndrome 08/22/2019   back pain, sacroiliitis   Chronic post-traumatic stress disorder (PTSD) 12/06/2020   Diabetic neuropathy (HCC) 02/06/2016   Dyslipidemia 09/24/2020   Essential hypertension    Functional diarrhea 10/26/2020   Herpes genitalis in women 07/16/2015   History of adenomatous polyp of colon 05/21/2019   Overview:   03/31/17: Colonoscopy: nonadvanced adenoma, microscopic colitis, f/u 5 yrs, [facility]/GAP   History of radiation therapy    Endometrial- 02/18/23-03/31/23-Dr. Lynwood Kinard   Hypotension 09/01/2022   Insomnia 01/23/2015   Metabolic acidosis 01/12/2023   Migraine headache with aura 02/12/2016   Myofascial pain dysfunction syndrome 08/22/2019   Non-alcoholic fatty liver disease 01/12/2018   OSA (obstructive sleep apnea) 02/24/2019   10/09/2018 - HST  - AHI 40.6    Osteopenia 12/06/2020   Rx alendronate  35 mg.   Pinguecula  12/06/2020   Presbyopia 12/06/2020   Pulmonary hypertension    RLS (restless legs syndrome) 04/27/2015   RSV (respiratory syncytial virus infection) 06/10/2023   Tremor, essential 12/11/2021   Type II diabetes mellitus (HCC) 09/24/2020    Assessment: Patient Reported Symptoms:  Cognitive Cognitive Status: Able to follow simple commands Cognitive/Intellectual Conditions Management [RPT]: None reported or documented in medical history or problem list   Health Maintenance Behaviors: Annual physical exam Healing Pattern: Slow Health Facilitated by: Rest  Neurological Neurological Review of Symptoms: No symptoms reported Neurological Management Strategies: Coping strategies Neurological Self-Management Outcome: 4 (good)  HEENT HEENT Symptoms Reported: Ear pain (ear itching  Dr has RX ABX drops for ear) HEENT Management Strategies: Medication therapy, Routine screening HEENT Self-Management Outcome: 4 (good)    Cardiovascular Cardiovascular Symptoms Reported: No symptoms reported Does patient have uncontrolled Hypertension?: No Cardiovascular Management Strategies: Coping strategies, Routine screening Weight: 160 lb (72.6 kg) (at home weight  Yesterday Dr weight, patient was completely bundled up) Cardiovascular Self-Management Outcome: 4 (good) Cardiovascular Comment: weighing daily  Respiratory Respiratory Symptoms Reported: No symptoms reported Respiratory Management Strategies: Routine screening Respiratory Self-Management Outcome: 4 (good)  Endocrine Endocrine Symptoms Reported: No symptoms reported Is patient diabetic?: Yes Is patient checking blood sugars at home?: Yes List most recent blood sugar readings, include date and time of day: 159 Fasting 06/17/2024 Endocrine Self-Management Outcome: 4 (good)  Gastrointestinal Gastrointestinal Symptoms Reported: Constipation Additional Gastrointestinal Details: suppositories/ laxative- no more black stools has not seen GI DR   States she will make appointment Gastrointestinal Management Strategies: Medication therapy Gastrointestinal Self-Management Outcome: 4 (good)    Genitourinary Genitourinary Symptoms Reported: No  symptoms reported Genitourinary Management Strategies: Adequate rest, Coping strategies Genitourinary Self-Management Outcome: 4 (good)  Integumentary Integumentary Symptoms Reported: Bruising Other Integumentary Symptoms: Bruising on face from fall/ itching in feet lower les- Dry Skin? Using Indian preparation and it is helping Skin Management Strategies: Adequate rest Skin Self-Management Outcome: 4 (good)  Musculoskeletal Musculoskelatal Symptoms Reviewed: Difficulty walking, Unsteady gait, Limited mobility, Weakness Musculoskeletal Management Strategies: Coping strategies, Routine screening Musculoskeletal Self-Management Outcome: 4 (good) Musculoskeletal Comment: Goes to [facility] [place]  2X a week Falls in the past year?: Yes Number of falls in past year: 1 or less Was there an injury with Fall?: Yes Fall Risk Category Calculator: 2 Patient Fall Risk Level: Moderate Fall Risk Patient at Risk for Falls Due to: History of fall(s), Impaired balance/gait Fall risk Follow up: Falls evaluation completed, Education provided, Falls prevention discussed  Psychosocial Psychosocial Symptoms Reported: No symptoms reported Behavioral Management Strategies: Medication therapy Behavioral Health Self-Management Outcome: 4 (good)   Quality of Family Relationships: helpful, involved, supportive Do you feel physically threatened by others?: No    06/17/2024    PHQ2-9 Depression Screening   Little interest or pleasure in doing things Not at all  Feeling down, depressed, or hopeless Not at all  PHQ-2 - Total Score 0  Trouble falling or staying asleep, or sleeping too much    Feeling tired or having little energy    Poor appetite or overeating     Feeling bad about yourself - or that you are a failure or  have let yourself or your family down    Trouble concentrating on things, such as reading the newspaper or watching television    Moving or speaking so slowly that other people could have noticed.  Or the opposite - being so fidgety or restless that you have been moving around a lot more than usual    Thoughts that you would be better off dead, or hurting yourself in some way    PHQ2-9 Total Score    If you checked off any problems, how difficult have these problems made it for you to do your work, take care of things at home, or get along with other people    Depression Interventions/Treatment      Today's Vitals   06/17/24 1112  BP: 125/80  Pulse: 64  Weight: 160 lb (72.6 kg)  Height: 5' 2 (1.575 m)   Pain Scale: 0-10 Pain Score: 7  Pain Type: Chronic pain Pain Location: Back Pain Orientation: Right, Lower Pain Descriptors / Indicators: Sharp Pain Onset: On-going Patients Stated Pain Goal: 0 Pain Intervention(s): Medication (See eMAR), Relaxation, Hot/Cold interventions Multiple Pain Sites: Yes  Medications Reviewed Today     Reviewed by Kay Hendricks MATSU, RN (Case Manager) on 06/17/24 at 1109  Med List Status: <None>   Medication Order Taking? Sig Documenting Provider Last Dose Status Informant  acetaminophen  (TYLENOL ) 325 MG tablet [id number] Yes Take 2 tablets (650 mg total) by mouth every 6 (six) hours as needed for mild pain (pain score 1-3) or fever (or Fever >/= 101). Pearlean Manus, MD  Active Spouse/Significant Other, Pharmacy Records  ALPRAZolam  (XANAX ) 1 MG tablet [id number] Yes Take 1 mg by mouth at bedtime as needed for sleep. [provider]  Active Spouse/Significant Other, Pharmacy Records  ARIPiprazole  (ABILIFY ) 2 MG tablet [id number] Yes Take 1 tablet (2 mg total) by mouth at bedtime.  Patient taking differently: Take 2 mg by mouth 2 (two) times daily.   Pearlean Manus, MD  Active Spouse/Significant  Other, Pharmacy Records  atorvastatin  (LIPITOR) 80  MG tablet [id number] Yes Take 1 tablet (80 mg total) by mouth daily. Pearlean Manus, MD  Active Spouse/Significant Other, Pharmacy Records  bupivacaine -epinephrine  (MARCAINE  W/ EPI) 0.5% -1:200000 injection [id number]   Tilford Franky BIRCH, MD  Active   carbidopa -levodopa  (SINEMET ) 10-100 MG tablet [id number] Yes Take 1 tablet by mouth 4 (four) times daily. For restless leg syndrome Pearlean Manus, MD  Active Spouse/Significant Other, Pharmacy Records  colchicine  0.6 MG tablet [id number]  Take 1 tablet (0.6 mg total) by mouth every 12 (twelve) hours as needed (gout flare.). For mouth sores  Patient not taking: Reported on 06/16/2024   Ricky Fines, MD  Active   Continuous Glucose Sensor (DEXCOM G7 SENSOR) [place] [id number] Yes CHANGE SENSOR EVERY 10 DAYS Shamleffer, Donell Cardinal, MD  Active   cyanocobalamin  (VITAMIN B12) 1000 MCG tablet [id number] Yes Take 1 tablet (1,000 mcg total) by mouth daily. Raenelle Donalda HERO, MD  Active Spouse/Significant Other, Pharmacy Records  cyanocobalamin  (VITAMIN B12) injection 1,000 mcg [id number]   Stacks, Warren, MD  Active   denosumab  (PROLIA ) injection 60 mg [id number]   [name], MD  Active   diazepam  (VALIUM ) 2 MG tablet [id number]  Take 2 tablets (4 mg total) by mouth daily. Don't give increase in Robaxin  until valium  completed- To break pain cycle  Patient not taking: Reported on 06/17/2024   Lovorn, Megan, MD  Active   diphenoxylate -atropine  (LOMOTIL ) 2.5-0.025 MG tablet [id number] Yes TAKE 2 TABLETS BY MOUTH FOUR TIMES DAILY AS NEEDED FOR DIARRHEA OR LOOSE LEELAND [name], MD  Active Spouse/Significant Other, Pharmacy Records  ELIQUIS  5 MG TABS tablet [id number] Yes TAKE 1 TABLET TWICE A DAY Turner, Wilbert SAUNDERS, MD  Active Spouse/Significant Other, Pharmacy Records  escitalopram  (LEXAPRO ) 10 MG tablet [id number] Yes Take 10 mg by mouth daily. [provider]  Active Spouse/Significant Other, Pharmacy Records           Med Note (WARD, ANGELICA G   Tue  Jan 12, 2024  2:28 PM)    ferrous sulfate  325 (65 FE) MG EC tablet [id number] Yes Take 1 tablet (325 mg total) by mouth 2 (two) times daily. Ricky Fines, MD  Active   furosemide  (LASIX ) 40 MG tablet [id number] Yes Take 1 tablet (40 mg total) by mouth 2 (two) times daily. Hayes Beckey CROME, NP  Active   gabapentin  (NEURONTIN ) 100 MG capsule [id number] Yes Take 1 capsule (100 mg total) by mouth 3 (three) times daily. Pearlean Manus, MD  Active Spouse/Significant Other, Pharmacy Records  hydrocortisone  (ANUSOL -HC) 2.5 % rectal cream [id number] Yes Place rectally 2 (two) times daily. Ricky Fines, MD  Active   hydrOXYzine  (VISTARIL ) 25 MG capsule [id number] Yes Take 1 capsule (25 mg total) by mouth every 8 (eight) hours as needed for anxiety. Pearlean Manus, MD  Active Spouse/Significant Other, Pharmacy Records  insulin  glargine (LANTUS  SOLOSTAR) 100 UNIT/ML Solostar Pen [id number] Yes Inject 10 Units into the skin at bedtime.  Patient taking differently: Inject 8 Units into the skin at bedtime.   Pearlean Manus, MD  Active Spouse/Significant Other, Pharmacy Records  insulin  lispro (HUMALOG  [facility]) 100 UNIT/ML KwikPen [id number] Yes Inject 0-10 Units into the skin 2 (two) times daily before a meal. Sliding scale Short acting Humalog  insulin  per sliding scale 0-10 ---:-- Insulin  injection 0-10 Units 0-10 Units Subcutaneous, 3 times daily with meals CBG 70 - 120: 0 unit CBG 121 - 150: 0 unit  CBG 151 - 200: 1 unit CBG 201 - 250:  2 units CBG 251 - 300: 4 units CBG 301 - 350: 6 units  CBG 351 - 400: 8 units  CBG > 400: 10 units Emokpae, Courage, MD  Active Spouse/Significant Other, Pharmacy Records  ketoconazole  (NIZORAL ) 2 % cream [id number] Yes Apply 1 Application topically daily. Lavell Lye A, FNP  Active   lidocaine  (LIDODERM ) 5 % [id number] Yes Place 1 patch onto the skin daily. Remove & Discard patch within 12 hours or as directed by MD Ricky Fines, MD  Active   lidocaine  (XYLOCAINE ) 5 % ointment  [id number] Yes Apply 1 Application topically 4 (four) times daily as needed. For foot and ankle nerve pain-  can use up to 4x/day as needed Lovorn, Megan, MD  Active   losartan  (COZAAR ) 25 MG tablet [id number] Yes Take 25 mg by mouth daily. [provider]  Active Spouse/Significant Other, Pharmacy Records  MAGNESIUM  PO [id number] Yes Take 1 tablet by mouth daily. [provider]  Active Spouse/Significant Other, Pharmacy Records  Methocarbamol  1000 MG TABS [id number] Yes Take 1,000 mg by mouth every 6 (six) hours as needed. Lovorn, Megan, MD  Active   metoprolol  tartrate (LOPRESSOR ) 50 MG tablet [id number] Yes Take 1 tablet (50 mg total) by mouth 2 (two) times daily. Pearlean Manus, MD  Active Spouse/Significant Other, Pharmacy Records  Misc. Devices (3-IN-1 BEDSIDE TOILET) MISC [id number] Yes Length of need 99 months M17.11 Osteoarthritis right knee / multiple falls / high risk fall Margrette Taft BRAVO, MD  Active Spouse/Significant Other, Pharmacy Records  nystatin  (MYCOSTATIN ) 100000 UNIT/ML suspension [id number] Yes Take 5 mLs (500,000 Units total) by mouth 4 (four) times daily. Lavell Lye A, FNP  Active   ondansetron  (ZOFRAN ) 4 MG tablet [id number] Yes Take 1 tablet (4 mg total) by mouth every 8 (eight) hours as needed for nausea or vomiting. For cancer related nausea Dettinger, Fonda LABOR, MD  Active Spouse/Significant Other, Pharmacy Records  OXcarbazepine  (TRILEPTAL ) 150 MG tablet [id number] Yes Take 1 tablet (150 mg total) by mouth 2 (two) times daily. Sheikh, Omair Fillmore, DO  Active Spouse/Significant Other, Pharmacy Records  oxyCODONE  (OXYCONTIN ) 20 mg 12 hr tablet [id number] Yes Take 1 tablet (20 mg total) by mouth every 12 (twelve) hours. G89.3- chronic pain- fill 28 days after 04/23/2024 Debby Fidela CROME, NP  Active   OXYCONTIN  20 MG 12 hr tablet [id number] Yes Take 1 tablet (20 mg total) by mouth every 12 (twelve) hours. G89.3- chronic pain- dispense in 28 days Lovorn, Megan, MD   Active   OXYCONTIN  20 MG 12 hr tablet [id number] Yes Take 1 tablet (20 mg total) by mouth every 12 (twelve) hours. For chronic pain G89.3- chronic pain- due to lumbar stenosis- dispense now Lovorn, Megan, MD  Active   pantoprazole  (PROTONIX ) 40 MG tablet [id number] Yes Take 1 tablet (40 mg total) by mouth 2 (two) times daily. Pearlean Manus, MD  Active Spouse/Significant Other, Pharmacy Records  potassium chloride  (KLOR-CON ) 10 MEQ tablet [id number] Yes Take 1 tablet (10 mEq total) by mouth daily. Shlomo Wilbert SAUNDERS, MD  Active Spouse/Significant Other, Pharmacy Records  pregabalin  (LYRICA ) 25 MG capsule [id number] Yes Take 1 capsule (25 mg total) by mouth 3 (three) times daily. Will start at at bedtime x 4 days then BID x 4 days, then TID- for nerve pain  Patient taking differently: Take 25 mg by mouth 2 (two) times daily.   Lovorn, Megan, MD  Active   rOPINIRole  (REQUIP ) 2 MG tablet [id number] Yes Take 1 tablet (2 mg total) by mouth at  bedtime. [name], MD  Active Spouse/Significant Other, Pharmacy Records  spironolactone  (ALDACTONE ) 25 MG tablet [id number] Yes Take 0.5 tablets (12.5 mg total) by mouth daily. Hayes Beckey CROME, NP  Active   tirzepatide  (MOUNJARO ) 15 MG/0.5ML Pen [id number] Yes Inject 15 mg into the skin once a week. Shamleffer, Ibtehal Jaralla, MD  Active   vitamin C  (VITAMIN C ) 500 MG tablet [id number] Yes Take 0.5 tablets (250 mg total) by mouth 2 (two) times daily. Ricky Fines, MD  Active   Vitamin D , Cholecalciferol , 25 MCG (1000 UT) TABS [id number] Yes Take 1,000 Units by mouth in the morning. [provider]  Active Spouse/Significant Other, Pharmacy Records  Med List Note (Ward, Angelica, CPhT 01/12/24 1617): [employer] insurance, pt's spouse assists with meds            Recommendation:   Continue Current Plan of Care  Follow Up Plan:   Telephone follow-up in 1 month  Hendricks Her RN, BSN  [place] [facility] RN Case Manager   Direct  Dial -[phone]

## 2024-06-19 NOTE — Progress Notes (Unsigned)
 " Cardiology Office Note   Date:  06/19/2024  ID:  Amber Stephenson, DOB November 28, 1945, MRN [id number] PCP: [name], MD  [facility] Providers Cardiologist:  Wilbert Bihari, MD    History of Present Illness Amber Stephenson is a 79 y.o. female with a past medical history of CAD, HTN, atrial fibrillation, HLD, CHF, CKD, type 2 DM, bipolar disorder, OSA not tolerant of CPAP. Patient presents today after an ED visit for a fall   Patient previously had a cath in 01/2018 that showed nonobstructive CAD with left dominant circulation. She more recently underwent stress PET in 09/2023 that was a normal, low risk study without evidence of ischemia. Echocardiogram in 02/2024 showed EF 50-55%, moderately reduced RV systolic function, severe biatrial dilation, mild MR, mild MS, mild AI.   Patient has permanent atrial fibrillation. Has been on eliquis  and metoprolol .   She was last seen by Dr. Bihari in 08/2023. At that time, HR was well controlled. Remained on metoprolol  tartrate 50 mg BID and eliquis . She had known RV dysfunction that was felt to be due to her OSA and asthma. She was set up for a 1 year follow up.   She was admitted in 02/2024 with diastolic heart failure, UTI, ABLA. Hemoglobin was 8. Colonoscopy and EGD showed AVMs treated with argon therapy. Eliquis  was restarted at DC.   Seen in the ED on 06/06/24 after she had a mechanical fall. CT head showed subdural hemorrhage layering along the posterior left falx measuring up to 2-3 mm in thickness. Neurosurgery recommended that patient hold eliquis  for 72 hours. Recommended outpatient cardiology discussion regarding eliquis  use in the setting of frequent falls   Atrial fibrillation  - Permanent, managed with rate control  -  - continue metoprolol  tartrate 50 mg BID  - Patient has been on eliquis  5 mg BID. Patient has been having frequent falls. Earlier this month suffered a subdural hematoma.  -   CAD  - Cath in 2019 showed  nonobstructive CAD. PET in 09/2023 normal  -  - Continue metoprolol  tartate 50 mg BID  - Continue lipitor 80 mg daily  - Not on ASA due to eliquis  use for afib   Chronic diastolic heart failure  Valvular disease  - Echocardiogram 02/2024 showed EF 50-55%, moderately reduced RV systolic function, mild MR, mild MS, mild AI  -  - Patient previously did not tolerate SGLT2i (UTIs)  - Continue spironolactone  12.5 mg daily and lasix  40 mg BID  - CMP 06/06/24 showed Na 132, K 3.9, creatinine 0.60  - Ordered repeat BMP   HTN  -  - Continue losartan  25 mg daily, metoprolol  tartrate 50 mg BID, and spironolactone  12.5 mg daily   HLD  - Lipid panel from 08/2023 showed LDL 68  - Continue lipitor 80 mg daily   ROS: ***  Studies Reviewed      *** Risk Assessment/Calculations {Does this patient have ATRIAL FIBRILLATION?:[phone]} No BP recorded.  {Refresh Note OR Click here to enter BP  :1}***       Physical Exam VS:  There were no vitals taken for this visit.       Wt Readings from Last 3 Encounters:  06/17/24 160 lb (72.6 kg)  06/16/24 166 lb (75.3 kg)  05/18/24 162 lb 9.6 oz (73.8 kg)    GEN: Well nourished, well developed in no acute distress NECK: No JVD; No carotid bruits CARDIAC: ***RRR, no murmurs, rubs, gallops RESPIRATORY:  Clear to auscultation without rales,  wheezing or rhonchi  ABDOMEN: Soft, non-tender, non-distended EXTREMITIES:  No edema; No deformity   ASSESSMENT AND PLAN ***    {Are you ordering a CV Procedure (e.g. stress test, cath, DCCV, TEE, etc)?   Press F2        :[id number]}  Dispo: ***  Signed, Rollo FABIENE Louder, PA-C   "

## 2024-06-20 ENCOUNTER — Encounter: Payer: Self-pay | Admitting: Registered Nurse

## 2024-06-20 ENCOUNTER — Encounter: Attending: Registered Nurse | Admitting: Registered Nurse

## 2024-06-20 VITALS — BP 127/85 | HR 85 | Ht 62.0 in | Wt 160.0 lb

## 2024-06-20 DIAGNOSIS — M545 Low back pain, unspecified: Secondary | ICD-10-CM | POA: Insufficient documentation

## 2024-06-20 DIAGNOSIS — W19XXXD Unspecified fall, subsequent encounter: Secondary | ICD-10-CM | POA: Diagnosis not present

## 2024-06-20 DIAGNOSIS — I5032 Chronic diastolic (congestive) heart failure: Secondary | ICD-10-CM | POA: Insufficient documentation

## 2024-06-20 DIAGNOSIS — M797 Fibromyalgia: Secondary | ICD-10-CM | POA: Diagnosis not present

## 2024-06-20 DIAGNOSIS — G894 Chronic pain syndrome: Secondary | ICD-10-CM | POA: Insufficient documentation

## 2024-06-20 DIAGNOSIS — Y92009 Unspecified place in unspecified non-institutional (private) residence as the place of occurrence of the external cause: Secondary | ICD-10-CM

## 2024-06-20 DIAGNOSIS — G8929 Other chronic pain: Secondary | ICD-10-CM

## 2024-06-20 DIAGNOSIS — M542 Cervicalgia: Secondary | ICD-10-CM | POA: Diagnosis not present

## 2024-06-20 DIAGNOSIS — M546 Pain in thoracic spine: Secondary | ICD-10-CM | POA: Insufficient documentation

## 2024-06-20 MED ORDER — OXYCONTIN 20 MG PO T12A: 20.0000 mg | EXTENDED_RELEASE_TABLET | Freq: Two times a day (BID) | ORAL | 0 refills | Status: DC

## 2024-06-20 MED ORDER — OXYCONTIN 20 MG PO T12A: 20.0000 mg | EXTENDED_RELEASE_TABLET | Freq: Two times a day (BID) | ORAL | 0 refills | Status: AC

## 2024-06-20 NOTE — Progress Notes (Signed)
 "  Subjective:    Patient ID: Amber Stephenson, female    DOB: [birth year], 79 y.o.   MRN: [id number]  Amber Stephenson is a 79 y.o. female who returns for follow up appointment for chronic pain and medication refill.She  states her pain is located in  neck, mid- lower back and generalized joint pain. She  rates her pain 8. Her current exercise regime is walking, and receiving physical therapy two days a week.   Ms. Krehbiel report she was walking the the bathroom on 06/06/2024, she can't recall the events of her fall, she believes she fell forward. She was evaluated at ED [facility], note was reviewed. She has resolving left facial ecchymosis. She was educated on falls prevention, she verbalizes understanding. She was instructed to use her walker at all times, she verbalizes understanding.   Ms. Mathison Morphine  equivalent is 60.00 MME.She is also prescribed Alprazolam   by Dr. Tasia .We have discussed the black box warning of using opioids and benzodiazepines. I highlighted the dangers of using these drugs together and discussed the adverse events including respiratory suppression, overdose, cognitive impairment and importance of compliance with current regimen. We will continue to monitor and adjust as indicated.  she is being closely monitored and under the care of her psychiatrist. .    Her last Oral Swab was Performed on 03/09/2024, it was consistent.    Vitals: 127/85 P: 85 95%  Pain Inventory Average Pain 7 Pain Right Now 8 My pain is constant and sharp  In the last 24 hours, has pain interfered with the following? General activity 10 Relation with others 0 Enjoyment of life 0 What TIME of day is your pain at its worst? morning , daytime, evening, and night Sleep (in general) fair - good  Pain is worse with: bending and standing Pain improves with: rest, therapy/exercise, pacing activities, medication, TENS, and heat Relief from Meds: 8  Family History  Problem Relation Age of Onset    Diabetes Mother    Heart disease Mother    Alzheimer's disease Father    Heart disease Sister        CABG   Diabetes Sister    Alcohol  abuse Sister    Stroke Brother    Heart disease Brother    Mental illness Brother    Diabetes Brother    Breast cancer Neg Hx    Ovarian cancer Neg Hx    Colon cancer Neg Hx    Endometrial cancer Neg Hx    Social History   Socioeconomic History   Marital status: Married    Spouse name: Financial Planner   Number of children: 3   Years of education: 15   Highest education level: Associate degree: academic program  Occupational History   Occupation: Retired    Comment: chief financial officer  Tobacco Use   Smoking status: Never   Smokeless tobacco: Never  Vaping Use   Vaping status: Never Used  Substance and Sexual Activity   Alcohol  use: Not Currently    Alcohol /week: 0.0 standard drinks of alcohol    Drug use: No   Sexual activity: Not Currently    Partners: Male    Birth control/protection: Post-menopausal  Other Topics Concern   Not on file  Social History Narrative   Lives at home with husband.    They have 3 children who live away - [place] , [place] , and [place] .   She is from [place]  and most of her family lives there.   Her husbands's family live nearby  Right handed.   Social Drivers of Health   Tobacco Use: Low Risk (06/16/2024)   Patient History    Smoking Tobacco Use: Never    Smokeless Tobacco Use: Never    Passive Exposure: Not on file  Financial Resource Strain: Low Risk (08/13/2023)   Overall Financial Resource Strain (CARDIA)    Difficulty of Paying Living Expenses: Not hard at all  Food Insecurity: No Food Insecurity (04/08/2024)   Epic    Worried About Programme Researcher, Broadcasting/film/video in the Last Year: Never true    Ran Out of Food in the Last Year: Never true  Transportation Needs: No Transportation Needs (04/08/2024)   Epic    Lack of Transportation (Medical): No    Lack of Transportation (Non-Medical): No  Physical Activity:  Insufficiently Active (08/13/2023)   Exercise Vital Sign    Days of Exercise per Week: 3 days    Minutes of Exercise per Session: 20 min  Stress: No Stress Concern Present (08/13/2023)   [employer] of Occupational Health - Occupational Stress Questionnaire    Feeling of Stress : Only a little  Social Connections: Socially Integrated (02/15/2024)   Social Connection and Isolation Panel    Frequency of Communication with Friends and Family: More than three times a week    Frequency of Social Gatherings with Friends and Family: Once a week    Attends Religious Services: More than 4 times per year    Active Member of Clubs or Organizations: Yes    Attends Banker Meetings: More than 4 times per year    Marital Status: Married  Depression (PHQ2-9): Low Risk (06/17/2024)   Depression (PHQ2-9)    PHQ-2 Score: 0  Alcohol  Screen: Low Risk (08/13/2023)   Alcohol  Screen    Last Alcohol  Screening Score (AUDIT): 0  Housing: Low Risk (04/08/2024)   Epic    Unable to Pay for Housing in the Last Year: No    Number of Times Moved in the Last Year: 0    Homeless in the Last Year: No  Utilities: Not At Risk (04/08/2024)   Epic    Threatened with loss of utilities: No  Health Literacy: Adequate Health Literacy (08/13/2023)   B1300 Health Literacy    Frequency of need for help with medical instructions: Never   Past Surgical History:  Procedure Laterality Date   BREAST REDUCTION SURGERY     COLONOSCOPY  2018   6 mm cecal tubular adenoma, sigmoid diverticulosis, random colon biopsies consistent with microscopic colitis   COLONOSCOPY N/A 01/22/2024   Procedure: COLONOSCOPY;  Surgeon: Eartha Angelia Sieving, MD;  [facility];  Service: Gastroenterology;  Laterality: N/A;   COLONOSCOPY N/A 02/23/2024   Procedure: COLONOSCOPY;  Surgeon: Eartha Angelia Sieving, MD;  [facility]: [facility];  Service: Gastroenterology;  Laterality: N/A;   ESOPHAGOGASTRODUODENOSCOPY N/A  01/22/2024   Procedure: EGD (ESOPHAGOGASTRODUODENOSCOPY);  Surgeon: Eartha Angelia, Sieving, MD;  [facility]: [facility];  Service: Gastroenterology;  Laterality: N/A;   ESOPHAGOGASTRODUODENOSCOPY N/A 02/23/2024   Procedure: EGD (ESOPHAGOGASTRODUODENOSCOPY);  Surgeon: Eartha Angelia, Sieving, MD;  [facility]: [facility];  Service: Gastroenterology;  Laterality: N/A;   EYE SURGERY Right    cateracts   HAMMER TOE SURGERY     LEFT HEART CATH AND CORONARY ANGIOGRAPHY N/A 02/03/2018   Procedure: LEFT HEART CATH AND CORONARY ANGIOGRAPHY;  Surgeon: Jordan, Peter M, MD;  Location: [facility] INVASIVE CV LAB;  Service: Cardiovascular;  Laterality: N/A;   REVERSE SHOULDER ARTHROPLASTY Right 08/19/2019  Procedure: REVERSE SHOULDER ARTHROPLASTY;  Surgeon: Kay Kemps, MD;  Location: [facility];  Service: Orthopedics;  Laterality: Right;  interscalene block   SHOULDER SURGERY Right    I BROKE MY SHOUDLER   THIGH SURGERY     TO REMOVE A TUMOR    Past Surgical History:  Procedure Laterality Date   BREAST REDUCTION SURGERY     COLONOSCOPY  2018   6 mm cecal tubular adenoma, sigmoid diverticulosis, random colon biopsies consistent with microscopic colitis   COLONOSCOPY N/A 01/22/2024   Procedure: COLONOSCOPY;  Surgeon: Eartha Angelia Sieving, MD;  [facility];  Service: Gastroenterology;  Laterality: N/A;   COLONOSCOPY N/A 02/23/2024   Procedure: COLONOSCOPY;  Surgeon: Eartha Angelia Sieving, MD;  [facility];  Service: Gastroenterology;  Laterality: N/A;   ESOPHAGOGASTRODUODENOSCOPY N/A 01/22/2024   Procedure: EGD (ESOPHAGOGASTRODUODENOSCOPY);  Surgeon: Eartha Angelia, Sieving, MD;  [facility];  Service: Gastroenterology;  Laterality: N/A;   ESOPHAGOGASTRODUODENOSCOPY N/A 02/23/2024   Procedure: EGD (ESOPHAGOGASTRODUODENOSCOPY);  Surgeon: Eartha Angelia, Sieving, MD;  [facility]: [facility];  Service: Gastroenterology;  Laterality: N/A;   EYE SURGERY Right     cateracts   HAMMER TOE SURGERY     LEFT HEART CATH AND CORONARY ANGIOGRAPHY N/A 02/03/2018   Procedure: LEFT HEART CATH AND CORONARY ANGIOGRAPHY;  Surgeon: Jordan, Peter M, MD;  Location: [facility] INVASIVE CV LAB;  Service: Cardiovascular;  Laterality: N/A;   REVERSE SHOULDER ARTHROPLASTY Right 08/19/2019   Procedure: REVERSE SHOULDER ARTHROPLASTY;  Surgeon: Kay Kemps, MD;  Location: [facility];  Service: Orthopedics;  Laterality: Right;  interscalene block   SHOULDER SURGERY Right    I BROKE MY SHOUDLER   THIGH SURGERY     TO REMOVE A TUMOR    Past Medical History:  Diagnosis Date   Allergic rhinitis 02/24/2019   Ambulatory dysfunction 04/23/2020   Atrial fibrillation with RVR (HCC) 05/19/2017   Back pain/sacroiliitis--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   Benign paroxysmal positional vertigo due to bilateral vestibular disorder 05/20/2019   Bipolar 1 disorder (HCC) 01/23/2015   with GAD, benzo dependence   CAD (coronary artery disease)    Nonobstructive; Managed by Dr. Charls   Cardiomegaly 01/12/2018   CHF (congestive heart failure) 06/08/2022   Chronic back pain 01/04/2015   Chronic constipation 04/25/2020   Chronic diastolic heart failure (HCC) 05/30/2015   Chronic pain syndrome 08/22/2019   back pain, sacroiliitis   Chronic post-traumatic stress disorder (PTSD) 12/06/2020   Diabetic neuropathy (HCC) 02/06/2016   Dyslipidemia 09/24/2020   Essential hypertension    Functional diarrhea 10/26/2020   Herpes genitalis in women 07/16/2015   History of adenomatous polyp of colon 05/21/2019   Overview:   03/31/17: Colonoscopy: nonadvanced adenoma, microscopic colitis, f/u 5 yrs, [facility]/GAP   History of radiation therapy    Endometrial- 02/18/23-03/31/23-Dr. Lynwood Kinard   Hypotension 09/01/2022   Insomnia 01/23/2015   Metabolic acidosis 01/12/2023   Migraine headache with aura  02/12/2016   Myofascial pain dysfunction syndrome 08/22/2019   Non-alcoholic fatty liver disease 01/12/2018   OSA (obstructive sleep apnea) 02/24/2019   10/09/2018 - HST  - AHI 40.6    Osteopenia 12/06/2020   Rx alendronate  35 mg.   Pinguecula 12/06/2020   Presbyopia 12/06/2020   Pulmonary hypertension    RLS (restless legs syndrome) 04/27/2015   RSV (respiratory syncytial virus infection) 06/10/2023   Tremor, essential 12/11/2021  Type II diabetes mellitus (HCC) 09/24/2020   Ht 5' 2 (1.575 m)   Wt 160 lb (72.6 kg) Comment: wt reported by patient  BMI 29.26 kg/m   Opioid Risk Score:   Fall Risk Score:  `1  Depression screen [facility] 2/9     06/17/2024   11:25 AM 05/17/2024   11:35 AM 04/08/2024   10:36 AM 03/15/2024    3:31 PM 03/11/2024    1:46 PM 02/26/2024   11:28 AM 02/15/2024    9:47 AM  Depression screen PHQ 2/9  Decreased Interest 0 0 0 0 0 0 0  Down, Depressed, Hopeless 0 0 0 0 0 0 0  PHQ - 2 Score 0 0 0 0 0 0 0  Difficult doing work/chores       Not difficult at all    Review of Systems  Musculoskeletal:  Positive for back pain and gait problem.       Pain in both hips  All other systems reviewed and are negative.      Objective:   Physical Exam Vitals and nursing note reviewed.  Constitutional:      Appearance: Normal appearance.  Neck:     Comments: Cervical Paraspinal Tenderness: C-5-C-6 Cardiovascular:     Rate and Rhythm: Normal rate and regular rhythm.     Pulses: Normal pulses.     Heart sounds: Normal heart sounds.  Pulmonary:     Effort: Pulmonary effort is normal.     Breath sounds: Normal breath sounds.  Musculoskeletal:     Comments: Normal Muscle Bulk and Muscle Testing Reveals:  Upper Extremities:Right: Decreased  ROM 90 Degrees and Muscle Strength 5/5 Right AC Joint Tenderness Left Upper Extremity: Full ROM and Muscle Strength 5/5 Lumbar Hypersensitivity Bilateral Greater Trochanter Tenderness Lower Extremities: Full ROM and Muscle  Strength 5/5 Arises from Table slowly Antalgic  Gait     Skin:    General: Skin is warm and dry.     Comments: Left facial resolving ecchymosis   Neurological:     Mental Status: She is alert and oriented to person, place, and time.  Psychiatric:        Mood and Affect: Mood normal.        Behavior: Behavior normal.          Assessment & Plan:  Cervicalgia: Continue HEP as Tolerated. Continue to monitor 2.Chronic Bilateral Thoracic Back Pain: Continue HEP as Tolerated. Continue to monitor.  3. Chronic Bilateral Low Back Pain without Sciatica: Continue Current Medication regimen. Continue to Monitor. Continue HEP as Tolerated. 06/20/2024 Fall subsequent encounter She was evaluated at ED: [facility]: Note was reviewed. : Educated on Falls Prevention, instructed to use her walker or cane at all times, she verbalizes understanding. She verbalizes understanding. Continue to Monitor. 06/20/2024 Chronic Pain Syndrome: Refilled: Oxycontin   20 mg every 12 hours #60. Second script sent for the following month.We will continue the opioid monitoring program, this consists of regular clinic visits, examinations, urine drug screen, pill counts as well as use of [place]  Controlled Substance Reporting system. A 12 month History has been reviewed on the [place]  Controlled Substance Reporting System on 06/20/2024 4. Chronic Bilateral Shoulder Pain:No complaints today.  Continue HEP as Tolerated. Continue to monitor. 06/20/2024 5. Chronic Right Hip pain: No complaints today.Continue HEP as Tolerated. Continue current medication regimen. Continue to Monitor. 06/20/2024 6. Fibromyalgia: Continue Pregabalin . Continue to monitor.   F/U  with Dr. Lovorn           "

## 2024-06-21 NOTE — Telephone Encounter (Signed)
 Seen at our practice since this. LS

## 2024-06-22 ENCOUNTER — Telehealth: Payer: Self-pay

## 2024-06-22 DIAGNOSIS — M810 Age-related osteoporosis without current pathological fracture: Secondary | ICD-10-CM

## 2024-06-22 MED ORDER — DENOSUMAB 60 MG/ML ~~LOC~~ SOSY: 60.0000 mg | PREFILLED_SYRINGE | Freq: Once | SUBCUTANEOUS | Status: AC

## 2024-06-22 NOTE — Telephone Encounter (Signed)
 Prolia  sent for benefit verification

## 2024-06-22 NOTE — Telephone Encounter (Signed)
 Prolia  VOB initiated via [email or web address]  Next Prolia  inj DUE: 07/17/24

## 2024-06-24 ENCOUNTER — Ambulatory Visit: Payer: Self-pay

## 2024-06-24 ENCOUNTER — Other Ambulatory Visit (HOSPITAL_COMMUNITY): Payer: Self-pay

## 2024-06-24 VITALS — BP 110/65 | HR 66 | Temp 98.0°F | Ht 62.0 in | Wt 169.0 lb

## 2024-06-24 DIAGNOSIS — Z1231 Encounter for screening mammogram for malignant neoplasm of breast: Secondary | ICD-10-CM

## 2024-06-24 DIAGNOSIS — Z0000 Encounter for general adult medical examination without abnormal findings: Secondary | ICD-10-CM | POA: Diagnosis not present

## 2024-06-24 NOTE — Telephone Encounter (Signed)
 Pt ready for scheduling for PROLIA  on or after : 07/17/24  Option# 1: Buy/Bill (Office supplied medication)  Out-of-pocket cost due at time of clinic visit: $0  Number of injection/visits approved: ---  Primary: MEDICARE Prolia  co-insurance: 0% Admin fee co-insurance: 0%  Secondary: TRICARE Prolia  co-insurance: covers the Medicare Part B coinsurance and deductible. Admin fee co-insurance:   Medical Benefit Details: Date Benefits were checked: 06/22/24 Deductible: $200.71 Met of $283 Required/ Coinsurance: 0%/ Admin Fee: 0%  Prior Auth: N/A PA# Expiration Date:   # of doses approved: ----------------------------------------------------------------------- Option# 2- Med Obtained from pharmacy:  Pharmacy benefit: Copay $--- (Paid to pharmacy) Admin Fee: --- (Pay at clinic)  Prior Auth: --- PA# Expiration Date:   # of doses approved:   If patient wants fill through the pharmacy benefit please send prescription to: ---, and include estimated need by date in rx notes. Pharmacy will ship medication directly to the office.  Patient NOT eligible for Prolia  Copay Card. Copay Card can make patient's cost as little as $25. Link to apply: [email or web address]  ** This summary of benefits is an estimation of the patient's out-of-pocket cost. Exact cost may very based on individual plan coverage.

## 2024-06-24 NOTE — Telephone Encounter (Signed)
 SABRA

## 2024-06-24 NOTE — Progress Notes (Signed)
 "  Chief Complaint  Patient presents with   Medicare Wellness     Subjective:   Amber Stephenson is a 79 y.o. female who presents for a Medicare Annual Wellness Visit.  Visit info / Clinical Intake: Medicare Wellness Visit Type:: Subsequent Annual Wellness Visit Persons participating in visit and providing information:: patient Medicare Wellness Visit Mode:: In-person (required for WTM) Interpreter Needed?: No Pre-visit prep was completed: yes AWV questionnaire completed by patient prior to visit?: no Living arrangements:: lives with spouse/significant other Patient's Overall Health Status Rating: very good Typical amount of pain: none Does pain affect daily life?: no Are you currently prescribed opioids?: no  Dietary Habits and Nutritional Risks How many meals a day?: 2 Eats fruit and vegetables daily?: yes Most meals are obtained by: preparing own meals In the last 2 weeks, have you had any of the following?: unintentional weight gain (per pt eating habits/per pt feels like eating too much) Diabetic:: (!) yes Any non-healing wounds?: no How often do you check your BS?: continuous glucose monitor Would you like to be referred to a Nutritionist or for Diabetic Management? : no  Functional Status Activities of Daily Living (to include ambulation/medication): Independent Ambulation: Independent with device- listed below Home Assistive Devices/Equipment: Johna Finder (specify Type) Medication Administration: Needs assistance (comment) (pt's husband) Home Management (perform basic housework or laundry): Independent Manage your own finances?: (!) no (pt's husband) Primary transportation is: family / friends (pt husbands) Concerns about vision?: no *vision screening is required for WTM* (last 2025) Concerns about hearing?: no  Fall Screening Falls in the past year?: 1 Number of falls in past year: 1 Was there an injury with Fall?: 0 Fall Risk Category Calculator: 2 Patient  Fall Risk Level: Moderate Fall Risk  Fall Risk Patient at Risk for Falls Due to: Impaired balance/gait; Impaired mobility Fall risk Follow up: Falls evaluation completed; Education provided  Home and Transportation Safety: All rugs have non-skid backing?: yes All stairs or steps have railings?: yes Grab bars in the bathtub or shower?: yes Have non-skid surface in bathtub or shower?: yes Good home lighting?: yes Regular seat belt use?: yes Hospital stays in the last year:: (!) yes Reason: falls  Cognitive Assessment Difficulty concentrating, remembering, or making decisions? : no Will 6CIT or Mini Cog be Completed: yes What year is it?: 0 points What month is it?: 3 points Give patient an address phrase to remember (5 components): [address] [place] About what time is it?: 0 points Count backwards from 20 to 1: 0 points Say the months of the year in reverse: 4 points Repeat the address phrase from earlier: 10 points 6 CIT Score: 17 points  Advance Directives (For Healthcare) Does Patient Have a Medical Advance Directive?: No Would patient like information on creating a medical advance directive?: No - Patient declined  Reviewed/Updated  Reviewed/Updated: Reviewed All (Medical, Surgical, Family, Medications, Allergies, Care Teams, Patient Goals); Medical History; Surgical History; Family History; Medications; Allergies; Care Teams; Patient Goals    Allergies (verified) Iodinated contrast media, Iodine, Latex, Tape, and Tizanidine    Current Medications (verified) Outpatient Encounter Medications as of 06/24/2024  Medication Sig   acetaminophen  (TYLENOL ) 325 MG tablet Take 2 tablets (650 mg total) by mouth every 6 (six) hours as needed for mild pain (pain score 1-3) or fever (or Fever >/= 101).   ALPRAZolam  (XANAX ) 1 MG tablet Take 1 mg by mouth at bedtime as needed for sleep.   ARIPiprazole  (ABILIFY ) 2 MG tablet  Take 1 tablet (2 mg total) by mouth at bedtime. (Patient  taking differently: Take 2 mg by mouth 2 (two) times daily.)   atorvastatin  (LIPITOR) 80 MG tablet Take 1 tablet (80 mg total) by mouth daily.   carbidopa -levodopa  (SINEMET ) 10-100 MG tablet Take 1 tablet by mouth 4 (four) times daily. For restless leg syndrome   Continuous Glucose Sensor (DEXCOM G7 SENSOR) MISC CHANGE SENSOR EVERY 10 DAYS   cyanocobalamin  (VITAMIN B12) 1000 MCG tablet Take 1 tablet (1,000 mcg total) by mouth daily.   diphenoxylate -atropine  (LOMOTIL ) 2.5-0.025 MG tablet TAKE 2 TABLETS BY MOUTH FOUR TIMES DAILY AS NEEDED FOR DIARRHEA OR LOOSE STOOLS   ELIQUIS  5 MG TABS tablet TAKE 1 TABLET TWICE A DAY   escitalopram  (LEXAPRO ) 10 MG tablet Take 10 mg by mouth daily.   ferrous sulfate  325 (65 FE) MG EC tablet Take 1 tablet (325 mg total) by mouth 2 (two) times daily.   furosemide  (LASIX ) 40 MG tablet Take 1 tablet (40 mg total) by mouth 2 (two) times daily.   gabapentin  (NEURONTIN ) 100 MG capsule Take 1 capsule (100 mg total) by mouth 3 (three) times daily.   hydrocortisone  (ANUSOL -HC) 2.5 % rectal cream Place rectally 2 (two) times daily.   hydrOXYzine  (VISTARIL ) 25 MG capsule Take 1 capsule (25 mg total) by mouth every 8 (eight) hours as needed for anxiety.   insulin  glargine (LANTUS  SOLOSTAR) 100 UNIT/ML Solostar Pen Inject 10 Units into the skin at bedtime. (Patient taking differently: Inject 8 Units into the skin at bedtime.)   insulin  lispro (HUMALOG  KWIKPEN) 100 UNIT/ML KwikPen Inject 0-10 Units into the skin 2 (two) times daily before a meal. Sliding scale Short acting Humalog  insulin  per sliding scale 0-10 ---:-- Insulin  injection 0-10 Units 0-10 Units Subcutaneous, 3 times daily with meals CBG 70 - 120: 0 unit CBG 121 - 150: 0 unit  CBG 151 - 200: 1 unit CBG 201 - 250: 2 units CBG 251 - 300: 4 units CBG 301 - 350: 6 units  CBG 351 - 400: 8 units  CBG > 400: 10 units   ketoconazole  (NIZORAL ) 2 % cream Apply 1 Application topically daily.   lidocaine  (LIDODERM ) 5 % Place 1 patch  onto the skin daily. Remove & Discard patch within 12 hours or as directed by MD   lidocaine  (XYLOCAINE ) 5 % ointment Apply 1 Application topically 4 (four) times daily as needed. For foot and ankle nerve pain-  can use up to 4x/day as needed   losartan  (COZAAR ) 25 MG tablet Take 25 mg by mouth daily.   MAGNESIUM  PO Take 1 tablet by mouth daily.   Methocarbamol  1000 MG TABS Take 1,000 mg by mouth every 6 (six) hours as needed.   metoprolol  tartrate (LOPRESSOR ) 50 MG tablet Take 1 tablet (50 mg total) by mouth 2 (two) times daily.   Misc. Devices (3-IN-1 BEDSIDE TOILET) MISC Length of need 99 months M17.11 Osteoarthritis right knee / multiple falls / high risk fall   nystatin  (MYCOSTATIN ) 100000 UNIT/ML suspension Take 5 mLs (500,000 Units total) by mouth 4 (four) times daily.   ondansetron  (ZOFRAN ) 4 MG tablet Take 1 tablet (4 mg total) by mouth every 8 (eight) hours as needed for nausea or vomiting. For cancer related nausea   OXcarbazepine  (TRILEPTAL ) 150 MG tablet Take 1 tablet (150 mg total) by mouth 2 (two) times daily.   oxyCODONE  (OXYCONTIN ) 20 mg 12 hr tablet Take 1 tablet (20 mg total) by mouth every 12 (twelve) hours.  G89.3- chronic pain- fill 28 days after 04/23/2024   OXYCONTIN  20 MG 12 hr tablet Take 1 tablet (20 mg total) by mouth every 12 (twelve) hours. G89.3- chronic pain- dispense in 28 days   OXYCONTIN  20 MG 12 hr tablet Take 1 tablet (20 mg total) by mouth every 12 (twelve) hours. For chronic pain G89.3- chronic pain- due to lumbar stenosis-  Do Not fill Before 07/18/2024   pantoprazole  (PROTONIX ) 40 MG tablet Take 1 tablet (40 mg total) by mouth 2 (two) times daily.   potassium chloride  (KLOR-CON ) 10 MEQ tablet Take 1 tablet (10 mEq total) by mouth daily.   pregabalin  (LYRICA ) 25 MG capsule Take 1 capsule (25 mg total) by mouth 3 (three) times daily. Will start at at bedtime x 4 days then BID x 4 days, then TID- for nerve pain (Patient taking differently: Take 25 mg by mouth 2  (two) times daily.)   rOPINIRole  (REQUIP ) 2 MG tablet Take 1 tablet (2 mg total) by mouth at bedtime.   spironolactone  (ALDACTONE ) 25 MG tablet Take 0.5 tablets (12.5 mg total) by mouth daily.   tirzepatide  (MOUNJARO ) 15 MG/0.5ML Pen Inject 15 mg into the skin once a week.   vitamin C  (VITAMIN C ) 500 MG tablet Take 0.5 tablets (250 mg total) by mouth 2 (two) times daily.   Vitamin D , Cholecalciferol , 25 MCG (1000 UT) TABS Take 1,000 Units by mouth in the morning.   colchicine  0.6 MG tablet Take 1 tablet (0.6 mg total) by mouth every 12 (twelve) hours as needed (gout flare.). 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   Benign paroxysmal positional vertigo due to bilateral vestibular disorder 05/20/2019   Bipolar 1 disorder (HCC) 01/23/2015   with GAD, benzo dependence   CAD (coronary artery disease)    Nonobstructive; Managed by Dr. Charls   Cardiomegaly 01/12/2018   CHF (congestive heart failure) 06/08/2022   Chronic back pain 01/04/2015   Chronic constipation 04/25/2020   Chronic diastolic heart failure (HCC) 05/30/2015   Chronic pain syndrome 08/22/2019   back pain, sacroiliitis   Chronic post-traumatic stress disorder (PTSD) 12/06/2020   Diabetic neuropathy (HCC) 02/06/2016   Dyslipidemia 09/24/2020   Essential hypertension    Functional diarrhea 10/26/2020   Herpes genitalis in women 07/16/2015   History  of adenomatous polyp of colon 05/21/2019   Overview:   03/31/17: Colonoscopy: nonadvanced adenoma, microscopic colitis, f/u 5 yrs, Murphy/GAP   History of radiation therapy    Endometrial- 02/18/23-03/31/23-Dr. Lynwood Kinard   Hypotension 09/01/2022   Insomnia 01/23/2015   Metabolic acidosis 01/12/2023   Migraine headache with aura 02/12/2016   Myofascial pain dysfunction syndrome 08/22/2019   Non-alcoholic fatty liver disease 01/12/2018   OSA (obstructive sleep apnea) 02/24/2019   10/09/2018 - HST  - AHI 40.6    Osteopenia 12/06/2020   Rx alendronate  35 mg.   Pinguecula 12/06/2020   Presbyopia 12/06/2020   Pulmonary hypertension    RLS (restless legs syndrome) 04/27/2015  RSV (respiratory syncytial virus infection) 06/10/2023   Tremor, essential 12/11/2021   Type II diabetes mellitus (HCC) 09/24/2020   Past Surgical History:  Procedure Laterality Date   BREAST REDUCTION SURGERY     COLONOSCOPY  2018   6 mm cecal tubular adenoma, sigmoid diverticulosis, random colon biopsies consistent with microscopic colitis   COLONOSCOPY N/A 01/22/2024   Procedure: COLONOSCOPY;  Surgeon: Eartha Angelia Sieving, MD;  Location: [facility];  Service: Gastroenterology;  Laterality: N/A;   COLONOSCOPY N/A 02/23/2024   Procedure: COLONOSCOPY;  Surgeon: Eartha Angelia Sieving, MD;  Location: [facility];  Service: Gastroenterology;  Laterality: N/A;   ESOPHAGOGASTRODUODENOSCOPY N/A 01/22/2024   Procedure: EGD (ESOPHAGOGASTRODUODENOSCOPY);  Surgeon: Eartha Angelia, Sieving, MD;  Location: [facility];  Service: Gastroenterology;  Laterality: N/A;   ESOPHAGOGASTRODUODENOSCOPY N/A 02/23/2024   Procedure: EGD (ESOPHAGOGASTRODUODENOSCOPY);  Surgeon: Eartha Angelia, Sieving, MD;  Location: [facility];  Service: Gastroenterology;  Laterality: N/A;   EYE SURGERY Right    cateracts   HAMMER TOE SURGERY     LEFT HEART CATH AND CORONARY ANGIOGRAPHY N/A 02/03/2018   Procedure: LEFT HEART CATH AND  CORONARY ANGIOGRAPHY;  Surgeon: Jordan, Peter M, MD;  Location: [facility];  Service: Cardiovascular;  Laterality: N/A;   REVERSE SHOULDER ARTHROPLASTY Right 08/19/2019   Procedure: REVERSE SHOULDER ARTHROPLASTY;  Surgeon: Kay Kemps, MD;  Location: [facility];  Service: Orthopedics;  Laterality: Right;  interscalene block   SHOULDER SURGERY Right    I BROKE MY SHOUDLER   THIGH SURGERY     TO REMOVE A TUMOR    Family History  Problem Relation Age of Onset   Diabetes Mother    Heart disease Mother    Alzheimer's disease Father    Heart disease Sister        CABG   Diabetes Sister    Alcohol  abuse Sister    Stroke Brother    Heart disease Brother    Mental illness Brother    Diabetes Brother    Breast cancer Neg Hx    Ovarian cancer Neg Hx    Colon cancer Neg Hx    Endometrial cancer Neg Hx    Social History   Occupational History   Occupation: Retired    Comment: chief financial officer  Tobacco Use   Smoking status: Never   Smokeless tobacco: Never  Vaping Use   Vaping status: Never Used  Substance and Sexual Activity   Alcohol  use: Not Currently    Alcohol /week: 0.0 standard drinks of alcohol    Drug use: No   Sexual activity: Not Currently    Partners: Male    Birth control/protection: Post-menopausal   Tobacco Counseling Counseling given: Yes  SDOH Screenings   Food Insecurity: No Food Insecurity (06/24/2024)  Housing: Low Risk (06/24/2024)  Transportation Needs: No Transportation Needs (06/24/2024)  Utilities: Not At Risk (06/24/2024)  Alcohol  Screen: Low Risk (08/13/2023)  Depression (PHQ2-9): Low Risk (06/24/2024)  Financial Resource Strain: Low Risk (08/13/2023)  Physical Activity: Inactive (06/24/2024)  Social Connections: Socially Integrated (06/24/2024)  Stress: No Stress Concern Present (06/24/2024)  Tobacco Use: Low Risk (06/24/2024)  Health Literacy: Adequate Health Literacy (06/24/2024)   See flowsheets for full screening details  Depression Screen PHQ 2  & 9 Depression Scale- Over the past 2 weeks, how often have you been bothered by any of the following problems? Little interest or pleasure in doing things: 0 Feeling down, depressed, or hopeless (PHQ Adolescent also includes...irritable): 0 PHQ-2 Total Score: 0 Trouble falling or  staying asleep, or sleeping too much: 0 Feeling tired or having little energy: 0 Poor appetite or overeating (PHQ Adolescent also includes...weight loss): 0 Feeling bad about yourself - or that you are a failure or have let yourself or your family down: 0 Trouble concentrating on things, such as reading the newspaper or watching television (PHQ Adolescent also includes...like school work): 0 Moving or speaking so slowly that other people could have noticed. Or the opposite - being so fidgety or restless that you have been moving around a lot more than usual: 0 Thoughts that you would be better off dead, or of hurting yourself in some way: 0 PHQ-9 Total Score: 0 If you checked off any problems, how difficult have these problems made it for you to do your work, take care of things at home, or get along with other people?: Not difficult at all  Depression Treatment Depression Interventions/Treatment : Currently on Treatment; Medication     Goals Addressed             This Visit's Progress    DIET - EAT MORE FRUITS AND VEGETABLES       Exercise 3x per week (30 min per time)   Not on track            Objective:    Today's Vitals   06/24/24 1305  BP: 110/65  Pulse: 66  Temp: 98 F (36.7 C)  TempSrc: Oral  Weight: 169 lb (76.7 kg)  Height: 5' 2 (1.575 m)   Body mass index is 30.91 kg/m.  Hearing/Vision screen No results found. Immunizations and Health Maintenance Health Maintenance  Topic Date Due   COVID-19 Vaccine (4 - 2025-26 season) 02/01/2024   Diabetic kidney evaluation - Urine ACR  06/17/2024   Mammogram  08/04/2024   DTaP/Tdap/Td (2 - Td or Tdap) 07/12/2024 (Originally 07/16/2021)    Bone Density Scan  09/06/2024 (Originally 04/06/2023)   HEMOGLOBIN A1C  08/15/2024   OPHTHALMOLOGY EXAM  09/09/2024   FOOT EXAM  09/20/2024   Diabetic kidney evaluation - eGFR measurement  06/06/2025   Medicare Annual Wellness (AWV)  06/24/2025   Colonoscopy  02/22/2029   Pneumococcal Vaccine: 50+ Years  Completed   Influenza Vaccine  Completed   Hepatitis C Screening  Completed   Zoster Vaccines- Shingrix  Completed   Meningococcal B Vaccine  Aged Out        Assessment/Plan:  This is a routine wellness examination for Amalee.  Patient Care Team: [name], MD as PCP - General (Family Medicine) Shlomo Wilbert SAUNDERS, MD as PCP - Cardiology (Cardiology) Kay Lynwood SQUIBB, MD as Referring Physician (Orthopedic Surgery) Tasia Lung, MD as Consulting Physician (Psychiatry) Georjean Darice HERO, MD as Consulting Physician (Neurology) Shamleffer, Donell Cardinal, MD as Consulting Physician (Endocrinology) Charmayne Molly, MD as Consulting Physician (Ophthalmology) Roddie Bring, DPM as Consulting Physician (Podiatry) Shlomo Wilbert SAUNDERS, MD as Consulting Physician (Cardiology) Shlomo Wilbert SAUNDERS, MD as Consulting Physician (Cardiology) Dina Camie FORBES DEVONNA (Neurology) Neda Jennet LABOR, MD as Consulting Physician (Pulmonary Disease) Parrett, Madelin RAMAN, NP as Nurse Practitioner (Pulmonary Disease) Bertrum Rosina HERO, RN as [facility] Care Management (General Practice) Bertrum Rosina HERO, RN  I have personally reviewed and noted the following in the patients chart:   Medical and social history Use of alcohol , tobacco or illicit drugs  Current medications and supplements including opioid prescriptions. Functional ability and status Nutritional status Physical activity Advanced directives List of other physicians Hospitalizations, surgeries, and ER visits in previous 12 months Vitals  Screenings to include cognitive, depression, and falls Referrals and appointments  Orders Placed This Encounter   Procedures   MM DIGITAL SCREENING BILATERAL    Standing Status:   Future    Expiration Date:   06/24/2025    Preferred imaging location?:   [facility]    Reason for exam::   breast cancer screening    Release to patient:   Immediate   In addition, I have reviewed and discussed with patient certain preventive protocols, quality metrics, and best practice recommendations. A written personalized care plan for preventive services as well as general preventive health recommendations were provided to patient.   Ozie Ned, CMA   06/24/2024   Return in 1 year (on 06/24/2025).  After Visit Summary: (In Person-Printed) AVS printed and given to the patient  Nurse Notes: pt is aware and due the following HM: UrineACR, mammogram-ordered, declined Covid vaccine "

## 2024-06-24 NOTE — Patient Instructions (Signed)
 Amber Stephenson,  Thank you for taking the time for your Medicare Wellness Visit. I appreciate your continued commitment to your health goals. Please review the care plan we discussed, and feel free to reach out if I can assist you further.  Please note that Annual Wellness Visits do not include a physical exam. Some assessments may be limited, especially if the visit was conducted virtually. If needed, we may recommend an in-person follow-up with your provider.  Ongoing Care Seeing your primary care provider every 3 to 6 months helps us  monitor your health and provide consistent, personalized care.   Referrals If a referral was made during today's visit and you haven't received any updates within two weeks, please contact the referred provider directly to check on the status.  Recommended Screenings:  Health Maintenance  Topic Date Due   Medicare Annual Wellness Visit  01/23/2024   COVID-19 Vaccine (4 - 2025-26 season) 02/01/2024   Kidney health urinalysis for diabetes  06/17/2024   Breast Cancer Screening  08/04/2024   DTaP/Tdap/Td vaccine (2 - Td or Tdap) 07/12/2024*   Osteoporosis screening with Bone Density Scan  09/06/2024*   Hemoglobin A1C  08/15/2024   Eye exam for diabetics  09/09/2024   Complete foot exam   09/20/2024   Yearly kidney function blood test for diabetes  06/06/2025   Colon Cancer Screening  02/22/2029   Pneumococcal Vaccine for age over 83  Completed   Flu Shot  Completed   Hepatitis C Screening  Completed   Zoster (Shingles) Vaccine  Completed   Meningitis B Vaccine  Aged Out  *Topic was postponed. The date shown is not the original due date.       06/24/2024   12:30 PM  Advanced Directives  Does Patient Have a Medical Advance Directive? Yes  Type of Estate Agent of [place];Living will  Copy of Healthcare Power of Attorney in Chart? No - copy requested    Vision: Annual vision screenings are recommended for early detection of  glaucoma, cataracts, and diabetic retinopathy. These exams can also reveal signs of chronic conditions such as diabetes and high blood pressure.  Dental: Annual dental screenings help detect early signs of oral cancer, gum disease, and other conditions linked to overall health, including heart disease and diabetes.  Please see the attached documents for additional preventive care recommendations.

## 2024-06-27 ENCOUNTER — Ambulatory Visit: Admitting: Cardiology

## 2024-06-27 NOTE — Progress Notes (Signed)
 " Cardiology Office Note   Date:  07/06/2024  ID:  KARL KNARR, DOB 1945/06/28, MRN [id number] PCP: [name], MD  [facility] Providers Cardiologist:  Wilbert Bihari, MD    History of Present Illness Amber Stephenson is a 79 y.o. female with a past medical history of CAD, HTN, atrial fibrillation, HLD, CHF, CKD, type 2 DM, bipolar disorder, OSA not tolerant of CPAP. Patient presents today after an ED visit for a fall   Patient previously had a cath in 01/2018 that showed nonobstructive CAD with left dominant circulation. She more recently underwent stress PET in 09/2023 that was a normal, low risk study without evidence of ischemia. Echocardiogram in 02/2024 showed EF 50-55%, moderately reduced RV systolic function, severe biatrial dilation, mild MR, mild MS, mild AI.   Patient has permanent atrial fibrillation. Has been on eliquis  and metoprolol .   She was last seen by Dr. Bihari in 08/2023. At that time, HR was well controlled. Remained on metoprolol  tartrate 50 mg BID and eliquis . She had known RV dysfunction that was felt to be due to her OSA and asthma. She was set up for a 1 year follow up.   She was admitted in 02/2024 with diastolic heart failure, UTI, ABLA. Hemoglobin was 8. Colonoscopy and EGD showed AVMs treated with argon therapy. Eliquis  was restarted at DC.   Seen in the ED on 06/06/24 after she had a mechanical fall. CT head showed subdural hemorrhage layering along the posterior left falx measuring up to 2-3 mm in thickness. Neurosurgery recommended that patient hold eliquis  for 72 hours. Recommended outpatient cardiology discussion regarding eliquis  use in the setting of frequent falls   On interview today, patient reports that she has fallen about 4 times in the past 6 months. Three of these falls have occurred in the past month. She denies having any syncope and reports that these falls have been mechanical. She is working with physical therapy to regain some strength  and balance. She Reports frequently feeling dizzy when she stands up. This occurs every time she stands up. She denies chest pain. Has episodes of shortness of breath at times, which are somewhat vague. These episodes occur about 2x per week. Occur randomly, but usually occur when she is sitting upright at rest. Denies lower extremity swelling. No palpitations   Studies Reviewed Cardiac Studies & Procedures   ______________________________________________________________________________________________   STRESS TESTS  NM PET CT CARDIAC PERFUSION MULTI W/ABSOLUTE BLOODFLOW 09/23/2023  Narrative   LV perfusion is normal. There is no evidence of ischemia. There is no evidence of infarction.   Rest left ventricular function is normal. Rest EF: 50%. Stress left ventricular function is normal. Stress EF: 51%. End diastolic cavity size is normal.   Myocardial blood flow was computed to be 0.66ml/g/min at rest and 1.66ml/g/min at stress. Global myocardial blood flow reserve was 2.52 and was normal.   Coronary calcium  was present on the attenuation correction CT images. Mild coronary calcifications were present. Coronary calcifications were present in the left anterior descending artery and left circumflex artery distribution(s).   The study is normal. The study is low risk.  Electronically signed by Darryle Decent, MD ______________________________________________________________________________________  CLINICAL DATA:  This over-read does not include interpretation of cardiac or coronary anatomy or pathology. The interpretation by the cardiologist is attached.  COMPARISON:  None Available.  FINDINGS: Scout view is grossly unremarkable. Atherosclerotic calcification of the aorta. Enlarged pulmonic trunk. 9 mm low right paratracheal lymph node is not  considered enlarged by CT size criteria. Review of the lungs is limited by expiratory phase imaging and respiratory motion. Calcified  granulomas.  IMPRESSION: 1. No acute extracardiac findings. 2.  Aortic atherosclerosis (ICD10-I70.0). 3. Enlarged pulmonic trunk, indicative of pulmonary arterial hypertension.   Electronically Signed By: Newell Eke M.D. On: 09/23/2023 10:19   ECHOCARDIOGRAM  ECHOCARDIOGRAM COMPLETE 02/17/2024  Narrative ECHOCARDIOGRAM REPORT    Patient Name:   Amber Stephenson Date of Exam: 02/17/2024 Medical Rec #:  [id number]         Height:       62.0 in Accession #:    [id number]        Weight:       182.8 lb Date of Birth:  05/09/1946          BSA:          1.840 m Patient Age:    78 years          BP:           140/86 mmHg Patient Gender: F                 HR:           76 bpm. Exam Location:  [name]  Procedure: 2D Echo, Color Doppler and Cardiac Doppler (Both Spectral and Color Flow Doppler were utilized during procedure).  Indications:    CHF I50.31  History:        Patient has prior history of Echocardiogram examinations, most recent 12/14/2020. CHF and Cardiomegaly, Arrythmias:Atrial Fibrillation; Risk Factors:Diabetes and Dyslipidemia. H/O Obstructive sleep apnea, Hypotension, Hypertension.  Sonographer:    BERNARDA ROCKS Referring Phys: BATUHAN.BOMBARD CARLOS MADERA  IMPRESSIONS   1. Left ventricular ejection fraction, by estimation, is 50 to 55%. The left ventricle has low normal function. Left ventricular endocardial border not optimally defined to evaluate regional wall motion. Left ventricular diastolic function could not be evaluated. 2. Right ventricular systolic function is moderately reduced. The right ventricular size is moderately enlarged. There is normal pulmonary artery systolic pressure. The estimated right ventricular systolic pressure is 28.8 mmHg. 3. Left atrial size was severely dilated. 4. Right atrial size was severely dilated. 5. The mitral valve is degenerative. Mild mitral valve regurgitation. Mild mitral stenosis. The mean mitral valve gradient is 4.0  mmHg. Severe mitral annular calcification. 6. The aortic valve is tricuspid. Aortic valve regurgitation is mild. Aortic valve sclerosis/calcification is present, without any evidence of aortic stenosis. Aortic regurgitation PHT measures 924 msec. Aortic valve area, by VTI measures 2.58 cm. Aortic valve mean gradient measures 2.0 mmHg. Aortic valve Vmax measures 1.09 m/s. 7. The inferior vena cava is dilated in size with <50% respiratory variability, suggesting right atrial pressure of 15 mmHg. 8. Recommend repeat limited study with definity contrast to evaluate LVF and wall motion more accurately  FINDINGS Left Ventricle: Left ventricular ejection fraction, by estimation, is 50 to 55%. The left ventricle has low normal function. Left ventricular endocardial border not optimally defined to evaluate regional wall motion. The left ventricular internal cavity size was normal in size. There is no left ventricular hypertrophy. Left ventricular diastolic function could not be evaluated due to atrial fibrillation. Left ventricular diastolic function could not be evaluated.  Right Ventricle: The right ventricular size is moderately enlarged. No increase in right ventricular wall thickness. Right ventricular systolic function is moderately reduced. There is normal pulmonary artery systolic pressure. The tricuspid regurgitant velocity is 1.86 m/s, and with an assumed right atrial pressure  of 15 mmHg, the estimated right ventricular systolic pressure is 28.8 mmHg.  Left Atrium: Left atrial size was severely dilated.  Right Atrium: Right atrial size was severely dilated.  Pericardium: There is no evidence of pericardial effusion.  Mitral Valve: The mitral valve is degenerative in appearance. There is mild thickening of the mitral valve leaflet(s). There is mild calcification of the mitral valve leaflet(s). Severe mitral annular calcification. Mild mitral valve regurgitation. Mild mitral valve stenosis. MV  peak gradient, 11.3 mmHg. The mean mitral valve gradient is 4.0 mmHg.  Tricuspid Valve: The tricuspid valve is normal in structure. Tricuspid valve regurgitation is trivial. No evidence of tricuspid stenosis.  Aortic Valve: The aortic valve is tricuspid. Aortic valve regurgitation is mild. Aortic regurgitation PHT measures 924 msec. Aortic valve sclerosis/calcification is present, without any evidence of aortic stenosis. Aortic valve mean gradient measures 2.0 mmHg. Aortic valve peak gradient measures 4.8 mmHg. Aortic valve area, by VTI measures 2.58 cm.  Pulmonic Valve: The pulmonic valve was normal in structure. Pulmonic valve regurgitation is trivial. No evidence of pulmonic stenosis.  Aorta: The aortic root is normal in size and structure.  Venous: The inferior vena cava is dilated in size with less than 50% respiratory variability, suggesting right atrial pressure of 15 mmHg.  IAS/Shunts: No atrial level shunt detected by color flow Doppler.   LEFT VENTRICLE PLAX 2D LVIDd:         4.60 cm      Diastology LVIDs:         3.60 cm      LV e' medial:    7.62 cm/s LV PW:         1.10 cm      LV E/e' medial:  21.4 LV IVS:        1.10 cm      LV e' lateral:   11.30 cm/s LVOT diam:     2.10 cm      LV E/e' lateral: 14.4 LV SV:         51 LV SV Index:   28 LVOT Area:     3.46 cm  LV Volumes (MOD) LV vol d, MOD A2C: 127.0 ml LV vol d, MOD A4C: 105.0 ml LV vol s, MOD A2C: 57.7 ml LV vol s, MOD A4C: 44.3 ml LV SV MOD A2C:     69.3 ml LV SV MOD A4C:     105.0 ml LV SV MOD BP:      65.9 ml  RIGHT VENTRICLE            IVC RV Basal diam:  4.60 cm    IVC diam: 2.80 cm RV S prime:     6.65 cm/s TAPSE (M-mode): 1.0 cm  LEFT ATRIUM              Index        RIGHT ATRIUM           Index LA diam:        5.20 cm  2.83 cm/m   RA Area:     28.50 cm LA Vol (A2C):   139.0 ml 75.55 ml/m  RA Volume:   94.40 ml  51.31 ml/m LA Vol (A4C):   126.0 ml 68.48 ml/m LA Biplane Vol: 134.0 ml 72.83  ml/m AORTIC VALVE                    PULMONIC VALVE AV Area (Vmax):    2.26 cm  PV Vmax:          0.76 m/s AV Area (Vmean):   2.27 cm     PV Peak grad:     2.3 mmHg AV Area (VTI):     2.58 cm     PR End Diast Vel: 6.22 msec AV Vmax:           109.00 cm/s AV Vmean:          68.400 cm/s AV VTI:            0.199 m AV Peak Grad:      4.8 mmHg AV Mean Grad:      2.0 mmHg LVOT Vmax:         71.00 cm/s LVOT Vmean:        44.900 cm/s LVOT VTI:          0.148 m LVOT/AV VTI ratio: 0.74 AI PHT:            924 msec  AORTA Ao Root diam: 3.60 cm Ao Asc diam:  3.70 cm  MITRAL VALVE                TRICUSPID VALVE MV Area (PHT): 5.13 cm     TR Peak grad:   13.8 mmHg MV Area VTI:   1.59 cm     TR Vmax:        186.00 cm/s MV Peak grad:  11.3 mmHg MV Mean grad:  4.0 mmHg     SHUNTS MV Vmax:       1.68 m/s     Systemic VTI:  0.15 m MV Vmean:      85.8 cm/s    Systemic Diam: 2.10 cm MV Decel Time: 148 msec MR Peak grad: 80.6 mmHg MR Vmax:      449.00 cm/s MV E velocity: 163.00 cm/s MV A velocity: 59.40 cm/s MV E/A ratio:  2.74  Wilbert Bihari MD Electronically signed by Wilbert Bihari MD Signature Date/Time: 02/17/2024/2:01:15 PM    Final          ______________________________________________________________________________________________      Risk Assessment/Calculations  CHA2DS2-VASc Score = 7   This indicates a 11.2% annual risk of stroke. The patient's score is based upon: CHF History: 1 HTN History: 1 Diabetes History: 1 Stroke History: 0 Vascular Disease History: 1 Age Score: 2 Gender Score: 1      Physical Exam VS:  BP 126/84   Pulse 70   Ht 5' 2 (1.575 m)   Wt 167 lb (75.8 kg)   SpO2 95%   BMI 30.54 kg/m        Wt Readings from Last 3 Encounters:  07/06/24 167 lb (75.8 kg)  06/24/24 169 lb (76.7 kg)  06/20/24 160 lb (72.6 kg)    GEN: Well nourished, well developed in no acute distress. Sitting comfortably in the wheelchair  NECK: No  JVD CARDIAC: Irregular rate and rhythm. no murmurs, rubs, gallops RESPIRATORY:  Clear to auscultation without rales, wheezing or rhonchi. Normal WOB on room air  ABDOMEN: Soft, non-tender, non-distended EXTREMITIES:  No edema in BLE; No deformity   ASSESSMENT AND PLAN  Atrial fibrillation  - Permanent, managed with rate control  - Denies palpitations, tachycardia  - continue metoprolol  tartrate 50 mg BID  - Patient has been on eliquis  5 mg BID. Patient has been having frequent falls. Earlier this month suffered a subdural hematoma.  - Today, patient tells me that she has had about 4 falls in the past 6  months. 3 of these occurred this month. All mechanical. No syncope. She has been referred to Dr. Almetta to discuss watchman. We reviewed the basics of this procedure, and I encouraged her to discuss further with Dr. Almetta. She is interested in the procedure  - Continue eliquis  5 mg BID. This is the correct dose for her   Dizziness  HTN  - Patient's BP often in the 120s/80s at home. She reports getting dizzy every time she goes from sitting to standing  - With her dizziness, discussed allowing her BP to run a bit higher, specifically in the 130s/80s. Stop spironolactone   - Encouraged patient to make position changes slowly and to stay adequately hydrated.  - Continue losartan  25 mg daily, metoprolol  tartrate 50 mg BID  CAD  - Cath in 2019 showed nonobstructive CAD. PET in 09/2023 normal  - Denies chest pain  - Continue metoprolol  tartate 50 mg BID  - Continue lipitor 80 mg daily  - Not on ASA due to eliquis  use for afib   Chronic diastolic heart failure  Valvular disease  - Echocardiogram 02/2024 showed EF 50-55%, moderately reduced RV systolic function, mild MR, mild MS, mild AI  - Patient appears euvolemic on exam today. Her weight is 167 today, down from 169 a few weeks ago. Overall stable  - Stop spiro as above for dizziness  - Patient previously did not tolerate SGLT2i (UTIs)   - Continue lasix  40 mg daily  - CMP 06/06/24 showed Na 132, K 3.9, creatinine 0.60  - Anticipate repeating echo in 1 year to monitor valvular disease   HLD  - Lipid panel from 08/2023 showed LDL 68  - Continue lipitor 80 mg daily   OSA  - Not tolerant of CPAP     Dispo: Follow up with Dr. Shlomo in 3-4 months   Signed, Rollo FABIENE Louder, PA-C   "

## 2024-06-28 ENCOUNTER — Telehealth: Payer: Self-pay

## 2024-06-28 ENCOUNTER — Telehealth (INDEPENDENT_AMBULATORY_CARE_PROVIDER_SITE_OTHER): Admitting: Family Medicine

## 2024-06-28 DIAGNOSIS — Z91199 Patient's noncompliance with other medical treatment and regimen due to unspecified reason: Secondary | ICD-10-CM

## 2024-06-28 NOTE — Progress Notes (Signed)
 Connected to video at 1215 and link was sent to mobile number on file.   Link sent again at 1232.  No show for visit, did not connect to visit, waited on video visit until 12:37.

## 2024-06-28 NOTE — Telephone Encounter (Signed)
 Copied from CRM #[id number]. Topic: Clinical - Medication Question >> Jun 27, 2024  1:38 PM Montie POUR wrote: Reason for CRM:  Ms. Mennen wanted to see if Dr. Zollie would call her in some cream for a yeast. The over the counter stuff does not work. Please call her at [phone] to let if this can be called in.  She uses [facility] Dr. Tinnie. Thanks

## 2024-06-29 ENCOUNTER — Ambulatory Visit (HOSPITAL_COMMUNITY)

## 2024-06-30 ENCOUNTER — Other Ambulatory Visit: Payer: Self-pay | Admitting: Family Medicine

## 2024-06-30 DIAGNOSIS — K130 Diseases of lips: Secondary | ICD-10-CM

## 2024-06-30 DIAGNOSIS — B370 Candidal stomatitis: Secondary | ICD-10-CM

## 2024-06-30 NOTE — Telephone Encounter (Signed)
 Copied from CRM [id number]. Topic: Clinical - Medication Refill >> Jun 30, 2024  4:16 PM Wess RAMAN wrote: Medication: ketoconazole  (NIZORAL ) 2 % cream  Has the patient contacted their pharmacy? No (Agent: If no, request that the patient contact the pharmacy for the refill. If patient does not wish to contact the pharmacy document the reason why and proceed with request.) (Agent: If yes, when and what did the pharmacy advise?)  This is the patient's preferred pharmacy:  [facility] [phone] - [place], [place] - [address] [facility] [address] [place] [address] Phone: [phone] Fax: [phone]  Is this the correct pharmacy for this prescription? Yes If no, delete pharmacy and type the correct one.   Has the prescription been filled recently? No  Is the patient out of the medication? Yes  Has the patient been seen for an appointment in the last year OR does the patient have an upcoming appointment? Yes  Can we respond through MyChart? Yes  Agent: Please be advised that Rx refills may take up to 3 business days. We ask that you follow-up with your pharmacy.

## 2024-07-01 ENCOUNTER — Encounter (HOSPITAL_COMMUNITY): Payer: Self-pay

## 2024-07-01 ENCOUNTER — Ambulatory Visit (HOSPITAL_COMMUNITY)

## 2024-07-01 DIAGNOSIS — R2681 Unsteadiness on feet: Secondary | ICD-10-CM

## 2024-07-01 DIAGNOSIS — M6281 Muscle weakness (generalized): Secondary | ICD-10-CM

## 2024-07-01 NOTE — Addendum Note (Signed)
 Addended by: INA RAMP D on: 07/01/2024 09:22 PM   Modules accepted: Orders

## 2024-07-01 NOTE — Telephone Encounter (Signed)
 Spoke with patient and spouse. Arranged Watchman consult with Dr. Almetta 3/20 at 3:45PM. They requested afternoon appointment. Added to Dr. Almetta wait list if anything becomes available sooner.   They were grateful for assistance.

## 2024-07-03 MED ORDER — KETOCONAZOLE 2 % EX CREA: 1.0000 | TOPICAL_CREAM | Freq: Every day | CUTANEOUS | 0 refills | Status: AC

## 2024-07-05 ENCOUNTER — Ambulatory Visit (HOSPITAL_COMMUNITY): Admitting: Physical Therapy

## 2024-07-06 ENCOUNTER — Ambulatory Visit: Admitting: Cardiology

## 2024-07-06 ENCOUNTER — Encounter: Payer: Self-pay | Admitting: Cardiology

## 2024-07-06 VITALS — BP 126/84 | HR 70 | Ht 62.0 in | Wt 167.0 lb

## 2024-07-06 DIAGNOSIS — R42 Dizziness and giddiness: Secondary | ICD-10-CM

## 2024-07-06 DIAGNOSIS — I4821 Permanent atrial fibrillation: Secondary | ICD-10-CM | POA: Diagnosis not present

## 2024-07-06 DIAGNOSIS — I5032 Chronic diastolic (congestive) heart failure: Secondary | ICD-10-CM

## 2024-07-06 DIAGNOSIS — I2510 Atherosclerotic heart disease of native coronary artery without angina pectoris: Secondary | ICD-10-CM | POA: Diagnosis not present

## 2024-07-06 DIAGNOSIS — E785 Hyperlipidemia, unspecified: Secondary | ICD-10-CM | POA: Diagnosis not present

## 2024-07-06 DIAGNOSIS — G4733 Obstructive sleep apnea (adult) (pediatric): Secondary | ICD-10-CM | POA: Diagnosis not present

## 2024-07-06 DIAGNOSIS — I10 Essential (primary) hypertension: Secondary | ICD-10-CM

## 2024-07-06 NOTE — Patient Instructions (Signed)
 Medication Instructions:  Stop Spirolactone  *If you need a refill on your cardiac medications before your next appointment, please call your pharmacy*  Lab Work: None  If you have labs (blood work) drawn today and your tests are completely normal, you will receive your results only by: MyChart Message (if you have MyChart) OR A paper copy in the mail If you have any lab test that is abnormal or we need to change your treatment, we will call you to review the results.  Testing/Procedures: None   Follow-Up: At [facility], you and your health needs are our priority.  As part of our continuing mission to provide you with exceptional heart care, our providers are all part of one team.  This team includes your primary Cardiologist (physician) and Advanced Practice Providers or APPs (Physician Assistants and Nurse Practitioners) who all work together to provide you with the care you need, when you need it.  Your next appointment:   3 - 4 month(s)  Provider:   Wilbert Bihari, MD     Other Instructions None

## 2024-07-07 ENCOUNTER — Encounter (HOSPITAL_COMMUNITY): Payer: Self-pay

## 2024-07-07 ENCOUNTER — Ambulatory Visit (HOSPITAL_COMMUNITY)

## 2024-07-07 DIAGNOSIS — M6281 Muscle weakness (generalized): Secondary | ICD-10-CM

## 2024-07-07 DIAGNOSIS — R2681 Unsteadiness on feet: Secondary | ICD-10-CM

## 2024-07-07 NOTE — Therapy (Signed)
 " OUTPATIENT PHYSICAL THERAPY LOWER EXTREMITY TREATMENT  Patient Name: Amber Stephenson MRN: [id number] DOB:[birth year], 79 y.o., female Today's Date: 07/07/2024  END OF SESSION:  PT End of Session - 07/07/24 1546     Visit Number 13    Number of Visits 18    Date for Recertification  07/29/24    Authorization Type Medicare part A and B, with [employer] for life secondary    Authorization Time Period no authorization required    Progress Note Due on Visit 18    PT Start Time 1547    PT Stop Time 1627    PT Time Calculation (min) 40 min    Equipment Utilized During Treatment Gait belt    Activity Tolerance Patient tolerated treatment well;Patient limited by fatigue    Behavior During Therapy WFL for tasks assessed/performed               Past Medical History:  Diagnosis Date   Allergic rhinitis 02/24/2019   Ambulatory dysfunction 04/23/2020   Atrial fibrillation with RVR (HCC) 05/19/2017   Back pain/sacroiliitis--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   Benign paroxysmal positional vertigo due to bilateral vestibular disorder 05/20/2019   Bipolar 1 disorder (HCC) 01/23/2015   with GAD, benzo dependence   CAD (coronary artery disease)    Nonobstructive; Managed by Dr. Charls   Cardiomegaly 01/12/2018   CHF (congestive heart failure) 06/08/2022   Chronic back pain 01/04/2015   Chronic constipation 04/25/2020   Chronic diastolic heart failure (HCC) 05/30/2015   Chronic pain syndrome 08/22/2019   back pain, sacroiliitis   Chronic post-traumatic stress disorder (PTSD) 12/06/2020   Diabetic neuropathy (HCC) 02/06/2016   Dyslipidemia 09/24/2020   Essential hypertension    Functional diarrhea 10/26/2020   Herpes genitalis in women 07/16/2015   History of adenomatous polyp of colon 05/21/2019   Overview:   03/31/17: Colonoscopy: nonadvanced adenoma, microscopic colitis, f/u 5  yrs, [facility]/GAP   History of radiation therapy    Endometrial- 02/18/23-03/31/23-Dr. Lynwood Kinard   Hypotension 09/01/2022   Insomnia 01/23/2015   Metabolic acidosis 01/12/2023   Migraine headache with aura 02/12/2016   Myofascial pain dysfunction syndrome 08/22/2019   Non-alcoholic fatty liver disease 01/12/2018   OSA (obstructive sleep apnea) 02/24/2019   10/09/2018 - HST  - AHI 40.6    Osteopenia 12/06/2020   Rx alendronate  35 mg.   Pinguecula 12/06/2020   Presbyopia 12/06/2020   Pulmonary hypertension    RLS (restless legs syndrome) 04/27/2015   RSV (respiratory syncytial virus infection) 06/10/2023   Tremor, essential 12/11/2021   Type II diabetes mellitus (HCC) 09/24/2020   Past Surgical History:  Procedure Laterality Date   BREAST REDUCTION SURGERY     COLONOSCOPY  2018   6 mm cecal tubular adenoma, sigmoid diverticulosis, random colon biopsies consistent with microscopic colitis   COLONOSCOPY N/A 01/22/2024   Procedure: COLONOSCOPY;  Surgeon: Eartha Angelia Sieving, MD;  Location: [facility];  Service: Gastroenterology;  Laterality: N/A;   COLONOSCOPY N/A 02/23/2024   Procedure: COLONOSCOPY;  Surgeon: Eartha Angelia Sieving, MD;  Location: [facility];  Service: Gastroenterology;  Laterality: N/A;   ESOPHAGOGASTRODUODENOSCOPY N/A 01/22/2024   Procedure: EGD (ESOPHAGOGASTRODUODENOSCOPY);  Surgeon: Eartha Angelia, Sieving, MD;  Location: [facility];  Service: Gastroenterology;  Laterality: N/A;   ESOPHAGOGASTRODUODENOSCOPY N/A 02/23/2024   Procedure: EGD (ESOPHAGOGASTRODUODENOSCOPY);  Surgeon: Eartha Angelia, Sieving, MD;  Location: [facility];  Service: Gastroenterology;  Laterality: N/A;   EYE SURGERY Right    cateracts   HAMMER TOE SURGERY     LEFT HEART CATH AND CORONARY ANGIOGRAPHY N/A 02/03/2018   Procedure: LEFT HEART CATH AND CORONARY ANGIOGRAPHY;  Surgeon: Jordan, Peter M, MD;  Location: [facility] INVASIVE CV [facility];  Service: Cardiovascular;  Laterality:  N/A;   REVERSE SHOULDER ARTHROPLASTY Right 08/19/2019   Procedure: REVERSE SHOULDER ARTHROPLASTY;  Surgeon: Kay Kemps, MD;  Location: [facility];  Service: Orthopedics;  Laterality: Right;  interscalene block   SHOULDER SURGERY Right    I BROKE MY SHOUDLER   THIGH SURGERY     TO REMOVE A TUMOR    Patient Active Problem List   Diagnosis Date Noted   Muscle spasm of back 05/18/2024   Radiation induced neuropathy 03/11/2024   Paroxysmal nerve pain 03/11/2024   RLQ abdominal pain 02/17/2024   Acute on chronic diastolic heart failure (HCC) 02/15/2024   Arcus senilis 02/04/2024   Benign neoplasm of skin 02/04/2024   Depressed bipolar I disorder in full remission 02/04/2024   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   Osteopenia 02/04/2024   Pain of foot 02/04/2024   Pain of right breast 02/04/2024   Phase of life problem 02/04/2024   Acute gastric ulcer without hemorrhage or perforation 01/23/2024   Hematochezia 01/21/2024   Acute anemia 01/21/2024   ABLA (acute blood loss anemia) 01/19/2024   Essential hypertension 01/07/2024   Rectal bleeding 10/07/2023   Nocturnal enuresis 08/25/2023   Hyponatremia 06/10/2023   Abdominal pain 06/10/2023   Diarrhea 06/10/2023   Candidiasis of mouth 05/15/2023   Type 2 diabetes mellitus with hyperglycemia, with long-term current use of insulin  (HCC) 02/12/2023   Long term (current) use of oral hypoglycemic drugs 02/12/2023   Primary hypertension 01/14/2023   Endometrial cancer (HCC) 01/12/2023   Vulvar itching 12/22/2022   Burning with urination 11/13/2022   Vitamin B12 deficiency 11/09/2022   Frequent falls 09/01/2022   AKI (acute kidney injury) 06/27/2022   Hemorrhoids 03/11/2022   Microalbuminuria 01/21/2022   Tremor, essential 12/11/2021   Chronic bilateral low back pain with right-sided sciatica 07/19/2021   Vaginal itching 04/16/2021   Sensory ataxia 03/28/2021   Elevated troponin 12/14/2020   Chronic post-traumatic  stress disorder (PTSD) 12/06/2020   Senile corneal changes 12/06/2020   Postmenopausal bleeding 12/06/2020   Acquired hammer toe 12/06/2020   Opioid dependence 12/06/2020   Hypermetropia 12/06/2020   Generalized weakness 12/06/2020   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   Generalized anxiety disorder 12/06/2020   Functional diarrhea 10/26/2020   Insulin  dependent type 2 diabetes mellitus (HCC) 09/24/2020   Chronic respiratory failure with hypoxia (HCC) 04/25/2020   Chronic constipation 04/25/2020   Chronic pain syndrome 08/22/2019   CAD (coronary artery disease)    Hypocalcemia    S/P shoulder replacement, 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   Mixed hyperlipidemia 05/20/2019   Vertigo 04/25/2019   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   OSA (obstructive sleep apnea) 02/24/2019   At risk for adverse drug event 07/06/2018   Non-alcoholic fatty liver disease 01/12/2018   Mild intermittent asthma 01/12/2018   Senile osteoporosis 12/15/2017   Atrial fibrillation with RVR (HCC) 05/19/2017   Migraine headache with aura 02/12/2016   Diabetic neuropathy (HCC) 02/06/2016   Bipolar 1 disorder (HCC) 10/04/2015   Major depressive disorder 10/03/2015   Herpes genitalis in women 07/16/2015   Long term current use of anticoagulant 05/30/2015   Chronic heart failure with preserved ejection fraction (HFpEF) (HCC) 05/30/2015   RLS (  restless legs syndrome) 04/27/2015   Insomnia 01/23/2015   Chronic atrial fibrillation with RVR (HCC) 01/04/2015    PCP: [name], MD  REFERRING PROVIDER: Margrette Taft BRAVO, MD   REFERRING DIAG: Gait disturbance, Contusion of right knee, initial encounter, Primary osteoarthritis of right knee   THERAPY DIAG:  Unsteadiness on feet  Muscle weakness (generalized)  Rationale for Evaluation and Treatment: Rehabilitation  ONSET DATE: November 2024  SUBJECTIVE:   SUBJECTIVE STATEMENT: Pt reports she having stomach pain this date, 7/10 in intensity. Reports  she's been more sleepy lately. And having some dizziness. Report she almost didn't come today.    EVAL: Patient reports that her balance is her primary concern as her right knee has gotten better. She feels that her balance has been getting worse since November 2024. She has tried using a cane and a walker, but she does not feel steady or safe using an assistive device. She is primarily a furniture walker for balance. She has fallen 3-4 times in the past 6 months. She notes that she has had multiple colonoscopies over the previous months due to abdominal pain. She has been having a lot of pain over the past few days and she is scheduled for another colonoscopy in December.   PERTINENT HISTORY: Atrial fibrillation, CHF, diabetic neuropathy, osteoporosis, bipolar 1 disorder, asthma, PTSD, anxiety, and history of cancer PAIN:  Are you having pain? Yes: NPRS scale: 0/10 Pain location: back  Pain description: sharp Aggravating factors: walking Relieving factors: rest and elevation  PRECAUTIONS: Fall  RED FLAGS: None   WEIGHT BEARING RESTRICTIONS: No  FALLS:  Has patient fallen in last 6 months? Yes. Number of falls 3-4  LIVING ENVIRONMENT: Lives with: lives with their spouse Lives in: House/apartment Stairs: Yes: Internal: 1 flight steps; on right going up; patient avoids these steps due to difficulty and instability Has following equipment at home: Quad cane small base, Walker - 4 wheeled, shower chair, and Grab bars  OCCUPATION: retired  PLOF: Independent  PATIENT GOALS: be able to go shopping and improved safety  NEXT MD VISIT: none scheduled  OBJECTIVE:  Note: Objective measures were completed at Evaluation unless otherwise noted.  DIAGNOSTIC FINDINGS: 01/29/24 right knee x-ray IMPRESSION: Moderate prepatellar soft tissue swelling is noted suggesting traumatic injury. No fracture or dislocation is noted.  PATIENT SURVEYS:  ABC scale: The Activities-Specific Balance  Confidence (ABC) Scale 0% 10 20 30  40 50 60 70 80 90 100% No confidence<->completely confident  How confident are you that you will not lose your balance or become unsteady when you . . .   Date tested 03/28/24 06/14/24  Walk around the house 20% 100%  2. Walk up or down stairs 10% 0%  3. Bend over and pick up a slipper from in front of a closet floor 10% 100%  4. Reach for a small can off a shelf at eye level 80% 100%  5. Stand on tip toes and reach for something above your head 40% 0%  6. Stand on a chair and reach for something 0% 0%  7. Sweep the floor 0% 100%  8. Walk outside the house to a car parked in the driveway 50% 100%  9. Get into or out of a car 60% 100%  10. Walk across a parking lot to the mall 0% 0%  11. Walk up or down a ramp 40% 100%  12. Walk in a crowded mall where people rapidly walk past you 10% 100%  13. Are bumped  into by people as you walk through the mall 0% 0%  14. Step onto or off of an escalator while you are holding onto the railing 40% 100%  15. Step onto or off an escalator while holding onto parcels such that you cannot hold onto the railing 0% 0%  16. Walk outside on icy sidewalks 0% 0%  Total: #/16 22.5%   56.25%      05/12/24: ABC Scale: 450 / 1600 = 28.1 %  COGNITION: Overall cognitive status: Within functional limits for tasks assessed     SENSATION: Light touch: Impaired  and increased sensitivity to light touch in bilateral lower 1/3 of the tibia Patient reports numbness or tingling in her feet and lower legs.   EDEMA:  No edema observed  POSTURE: rounded shoulders, forward head, and flexed trunk   LOWER EXTREMITY ROM: unable to assess secondary to pain   LOWER EXTREMITY MMT: high pain severity and irritability with MMT which limited full assessment   MMT Right eval Left eval  Hip flexion 4-/5 3+/5  Hip extension    Hip abduction    Hip adduction    Hip internal rotation    Hip external rotation    Knee flexion    Knee  extension    Ankle dorsiflexion 4-/5 4-/5  Ankle plantarflexion    Ankle inversion    Ankle eversion     (Blank rows = not tested)  FUNCTIONAL TESTS: will be completed a following appointments, as able 5 times sit to stand: 16 seconds on 04/04/24, BUE push up from arm rests Timed up and go (TUG): 21 seconds on 04/04/24, no AD 2 minute walk test: 194 ft on 04/04/24, no AD, pt SOB 05/12/24: TUG: 39.06 seconds with walker, 17.5 seconds without walker 5TSTS: 11.26 seconds  06/14/24: TUG: 19.79 seconds 5 times sit to stand: 15.13 seconds without arm rests 2 minute walk test: 167 feet with CGA (standing rest breaks at 55 seconds and 1:30, respectively, secondary to reported back pain)    GAIT: Assistive device utilized: None Level of assistance: SBA Comments: very unsteady with ambulation, required external support from the wall and other objects for safety                                                                                                                                TREATMENT DATE: 07/07/24: Vitals: SpO2 90%, BP: 123/72, HR 78 In Supine: Hip adduction, 5 holds, 20x Hip abd, RTB, 2x10 Resisted march, RTB, 20x on RLE, 10x with LLE  pt reports hip discomfort Bridges, 2x10 Seated heel/toe raises, 20x STS, 5x2, verbal cues for form to limit LBP Gait trial, 20 ft, limited because pain, verbal cues for upright posture and step lengths    07/01/24:  Educated benefits of walking program, gait training with rollator 2 min 118ft Checked O2 saturation with 79%, instructed deep breathing x 2 min, increased to 96%. STS with no HHA 10x,  reports increased fatigue following. Standing by  - Abduction with UE support 3 sets 5 reps - Sidestep 2RT 1st with UE support; 2nd without HHA  Educated/Discussed oxygen  levels, SOB, dizziness can be related to O2 saturation levels- encouraged to discuss with MD as may need to begin O2 during day. Ambulated to restroom with SBA for safety                                    06/14/24 EXERCISE LOG  Exercise Repetitions and Resistance Comments  Goal assessment  TUG, 5x STS, 2 minute walk test, and ABC scale    Seated hip ADD isometric  2 minutes w/ 5 second hold    Seated hip ADD isometric  2 minutes w/ 5 second hold    Seated hip ABD isometric  2 minutes w/ 5 second hold  With therapist resistance  LAQ  20 reps each    Patient education Progress with physical therapy, objective findings, safety, utilizing a AD for mobility, and POC     Blank cell = exercise not performed today    PATIENT EDUCATION:  Education details: POC, prognosis, pain, progress with physical therapy, and following up with her referring physician Person educated: Patient Education method: Explanation Education comprehension: verbalized understanding  HOME EXERCISE PROGRAM: Access Code: [email or web address] URL: [email or web address] Date: 04/04/2024 Prepared by: Rosaria Powell-Butler  Exercises - Sit to Stand  - 2 x daily - 7 x weekly - 2 sets - 10 reps - Seated Heel Toe Raises  - 2 x daily - 7 x weekly - 2 sets - 10 reps - Seated Long Arc Quad  - 2 x daily - 7 x weekly - 2 sets - 10 reps - Standing Hip Abduction with Counter Support  - 2 x daily - 7 x weekly - 2 sets - 10 reps  Access Code: [email or web address] URL: [email or web address] Date: 05/04/2024 - Supine Bridge  - 2 x daily - 7 x weekly - 10 reps - Small Range Straight Leg Raise  - 2 x daily - 7 x weekly - 10 reps - Sidelying Hip Abduction  - 2 x daily - 7 x weekly - 10 reps  ASSESSMENT:  CLINICAL IMPRESSION:  Patient arrives to session in increased discomfort. Reports she is having stomach pain, feels dizzy, and sleepy. Patient reports dizziness as room spinning and that she has a history of vertigo. Patient reports she would like to continue to attempt therapy session. Patients vitals are WNL. Began supine in bed with hip strengthening exercises. Pt reports no dizziness with change in  position. Did some lower leg strengthening in seated position and STS at EOS. Verbal cues are given for form, including scooting bottom to EOB bringing feet posterior and using UE to push from surface, during STS to limit LBP during transfer. Attempted to gait train at EOS with cues for posture but pt limited because pain and fatigue. Throughout session, pt is very sleepy and falls asleep mid-exercise, requiring continuous arousal in all positions.  Pt will continue to benefit from skilled therapy to address strength, balance, and endurance deficits to improve/decrease fall risk.       EVAL: Patient is a 79 y.o. female who was seen today for physical therapy evaluation and treatment for unsteadiness on her feet and history of falling. She is a high fall risk as evidenced by her gait mechanics and her history of falling. Her full objective  assessment was unable to be completed due to her high pain severity and irritability. She requested to leave early secondary to her pain levels. Recommend that she continue with skilled physical therapy to address her impairments to maximize her safety and functional mobility.    OBJECTIVE IMPAIRMENTS: Abnormal gait, decreased activity tolerance, decreased balance, decreased knowledge of use of DME, decreased mobility, difficulty walking, decreased strength, postural dysfunction, and pain.   ACTIVITY LIMITATIONS: carrying, bending, standing, stairs, transfers, and locomotion level  PARTICIPATION LIMITATIONS: meal prep, cleaning, laundry, shopping, and community activity  PERSONAL FACTORS: Past/current experiences, Time since onset of injury/illness/exacerbation, and 3+ comorbidities: Atrial fibrillation, CHF, diabetic neuropathy, osteoporosis, bipolar 1 disorder, asthma, PTSD, anxiety, and history of cancer are also affecting patient's functional outcome.   REHAB POTENTIAL: Fair    CLINICAL DECISION MAKING: Unstable/unpredictable  EVALUATION COMPLEXITY:  High   GOALS: Goals reviewed with patient? Yes  LONG TERM GOALS: Target date: 04/25/24  Patient will be independent with her HEP.  Baseline: patient reports that she is doing her HEP twice per day Goal status: MET  2.  Patient will improve her ABC by at least 10% for improved perceived function with her daily activities.  Baseline: 56.25% (33.75% improvement) Goal status: MET  3.  Patient will be able to complete the timed up and go in 12 seconds or less to reduce her fall risk.  Baseline:  Goal status: Progressing  4.  Patient will be able to demonstrate proper utilization of the least restrictive assistive device to improve her community mobility.  Baseline: (performs worse on TUG 05/12/24 with RW Goal status: Progressing   PLAN:  PT FREQUENCY: 2x/week  PT DURATION: 4 weeks  PLANNED INTERVENTIONS: 97164- PT Re-evaluation, 97750- Physical Performance Testing, 97110-Therapeutic exercises, 97530- Therapeutic activity, [id number]- Neuromuscular re-education, 97535- Self Care, [id number]- Manual therapy, [phone]- Gait training, Patient/Family education, Balance training, Stair training, Cryotherapy, and Moist heat  PLAN FOR NEXT SESSION: gait training with LRAD, balance interventions, lower extremity strengthening. Follow up if using RW, completing HEP and if she's had any falls.  Extending for 5 weeks, 2 visits per week PN visit 8 (05/12/24).  Discussed walking program for activity tolerance with RW.  F/U with MD apt.  Check vitals.    4:38 PM, 07/07/24 Rosaria Settler, PT, DPT [facility] [place]  "

## 2024-07-08 ENCOUNTER — Ambulatory Visit (HOSPITAL_COMMUNITY)

## 2024-07-08 ENCOUNTER — Ambulatory Visit: Admitting: Student in an Organized Health Care Education/Training Program

## 2024-07-08 ENCOUNTER — Encounter: Payer: Self-pay | Admitting: Student in an Organized Health Care Education/Training Program

## 2024-07-08 VITALS — BP 108/64 | HR 68 | Ht 62.0 in | Wt 167.0 lb

## 2024-07-08 DIAGNOSIS — I4821 Permanent atrial fibrillation: Secondary | ICD-10-CM

## 2024-07-08 NOTE — Progress Notes (Unsigned)
" °  Cardiology Office Note   Date:  07/08/2024  ID:  Amber Stephenson, DOB 01-Jul-1945, MRN [id number] PCP: [name], MD  [facility] Providers Cardiologist:  Wilbert Bihari, MD { Click to update primary MD,subspecialty MD or APP then REFRESH:1}    History of Present Illness Amber Stephenson is a 79 y.o. female ***  ROS: ***  Studies Reviewed      *** Risk Assessment/Calculations {Does this patient have ATRIAL FIBRILLATION?:[phone]}         Physical Exam VS:  BP 108/64 (BP Location: Right Arm, Patient Position: Sitting, Cuff Size: Normal)   Pulse 68   Ht 5' 2 (1.575 m)   Wt 167 lb (75.8 kg)   SpO2 97%   BMI 30.54 kg/m        Wt Readings from Last 3 Encounters:  07/08/24 167 lb (75.8 kg)  07/06/24 167 lb (75.8 kg)  06/24/24 169 lb (76.7 kg)    GEN: Well nourished, well developed in no acute distress NECK: No JVD; No carotid bruits CARDIAC: ***RRR, no murmurs, rubs, gallops RESPIRATORY:  Clear to auscultation without rales, wheezing or rhonchi  ABDOMEN: Soft, non-tender, non-distended EXTREMITIES:  No edema; No deformity   ASSESSMENT AND PLAN ***    {Are you ordering a CV Procedure (e.g. stress test, cath, DCCV, TEE, etc)?   Press F2        :[id number]}  Dispo: ***  Signed, Donnice DELENA Primus, MD  "

## 2024-07-08 NOTE — Patient Instructions (Signed)
 Medication Instructions:  Your physician recommends that you continue on your current medications as directed. Please refer to the Current Medication list given to you today.  *If you need a refill on your cardiac medications before your next appointment, please call your pharmacy*  Lab Work: None ordered.  If you have labs (blood work) drawn today and your tests are completely normal, you will receive your results only by: MyChart Message (if you have MyChart) OR A paper copy in the mail If you have any lab test that is abnormal or we need to change your treatment, we will call you to review the results.  Testing/Procedures: None ordered.   Follow-Up: At [facility], you and your health needs are our priority.  As part of our continuing mission to provide you with exceptional heart care, our providers are all part of one team.  This team includes your primary Cardiologist (physician) and Advanced Practice Providers or APPs (Physician Assistants and Nurse Practitioners) who all work together to provide you with the care you need, when you need it.  Your next appointment:   Follow up with Dr Almetta as needed

## 2024-07-11 ENCOUNTER — Ambulatory Visit (HOSPITAL_COMMUNITY)

## 2024-07-14 ENCOUNTER — Ambulatory Visit (HOSPITAL_COMMUNITY)

## 2024-07-18 ENCOUNTER — Telehealth

## 2024-07-18 ENCOUNTER — Ambulatory Visit: Admitting: Radiation Oncology

## 2024-07-19 ENCOUNTER — Ambulatory Visit: Admitting: Family Medicine

## 2024-07-19 ENCOUNTER — Ambulatory Visit

## 2024-07-19 ENCOUNTER — Encounter: Admitting: Registered Nurse

## 2024-07-20 ENCOUNTER — Ambulatory Visit

## 2024-07-21 ENCOUNTER — Ambulatory Visit (HOSPITAL_COMMUNITY)

## 2024-07-25 ENCOUNTER — Ambulatory Visit (HOSPITAL_COMMUNITY)

## 2024-08-15 ENCOUNTER — Encounter: Admitting: Physical Medicine and Rehabilitation

## 2024-08-19 ENCOUNTER — Ambulatory Visit: Admitting: Student in an Organized Health Care Education/Training Program

## 2024-09-13 ENCOUNTER — Ambulatory Visit: Admitting: Physician Assistant

## 2024-09-16 ENCOUNTER — Encounter: Admitting: Physical Medicine and Rehabilitation

## 2024-09-21 ENCOUNTER — Ambulatory Visit: Admitting: Internal Medicine

## 2024-10-03 ENCOUNTER — Other Ambulatory Visit (HOSPITAL_COMMUNITY)

## 2024-10-10 ENCOUNTER — Inpatient Hospital Stay: Admitting: Psychiatry

## 2024-12-12 ENCOUNTER — Encounter: Admitting: Physical Medicine and Rehabilitation

## 2025-06-27 ENCOUNTER — Ambulatory Visit
